# Patient Record
Sex: Female | Born: 1945
Health system: Southern US, Community
[De-identification: ages and names within clinical notes are randomized; demographics above are authoritative.]

## PROBLEM LIST (undated history)

## (undated) DIAGNOSIS — R3915 Urgency of urination: Secondary | ICD-10-CM

## (undated) DIAGNOSIS — R1310 Dysphagia, unspecified: Secondary | ICD-10-CM

## (undated) DIAGNOSIS — M797 Fibromyalgia: Secondary | ICD-10-CM

## (undated) DIAGNOSIS — N189 Chronic kidney disease, unspecified: Secondary | ICD-10-CM

## (undated) DIAGNOSIS — R51 Headache: Secondary | ICD-10-CM

## (undated) DIAGNOSIS — R102 Pelvic and perineal pain unspecified side: Secondary | ICD-10-CM

## (undated) DIAGNOSIS — S060X9A Concussion with loss of consciousness of unspecified duration, initial encounter: Secondary | ICD-10-CM

## (undated) DIAGNOSIS — D649 Anemia, unspecified: Secondary | ICD-10-CM

## (undated) DIAGNOSIS — Z85828 Personal history of other malignant neoplasm of skin: Secondary | ICD-10-CM

## (undated) DIAGNOSIS — F329 Major depressive disorder, single episode, unspecified: Secondary | ICD-10-CM

## (undated) DIAGNOSIS — I4892 Unspecified atrial flutter: Secondary | ICD-10-CM

## (undated) DIAGNOSIS — Z86010 Personal history of colonic polyps: Secondary | ICD-10-CM

## (undated) DIAGNOSIS — G629 Polyneuropathy, unspecified: Secondary | ICD-10-CM

## (undated) DIAGNOSIS — T7840XA Allergy, unspecified, initial encounter: Secondary | ICD-10-CM

## (undated) DIAGNOSIS — S060XAA Concussion with loss of consciousness status unknown, initial encounter: Secondary | ICD-10-CM

## (undated) DIAGNOSIS — Z8489 Family history of other specified conditions: Secondary | ICD-10-CM

## (undated) DIAGNOSIS — I499 Cardiac arrhythmia, unspecified: Secondary | ICD-10-CM

## (undated) DIAGNOSIS — I10 Essential (primary) hypertension: Secondary | ICD-10-CM

## (undated) DIAGNOSIS — J449 Chronic obstructive pulmonary disease, unspecified: Secondary | ICD-10-CM

## (undated) DIAGNOSIS — J189 Pneumonia, unspecified organism: Secondary | ICD-10-CM

## (undated) DIAGNOSIS — M3500 Sicca syndrome, unspecified: Secondary | ICD-10-CM

## (undated) DIAGNOSIS — M1990 Unspecified osteoarthritis, unspecified site: Secondary | ICD-10-CM

## (undated) DIAGNOSIS — R350 Frequency of micturition: Secondary | ICD-10-CM

## (undated) DIAGNOSIS — H269 Unspecified cataract: Secondary | ICD-10-CM

## (undated) DIAGNOSIS — C50919 Malignant neoplasm of unspecified site of unspecified female breast: Secondary | ICD-10-CM

## (undated) DIAGNOSIS — E785 Hyperlipidemia, unspecified: Secondary | ICD-10-CM

## (undated) DIAGNOSIS — Z8709 Personal history of other diseases of the respiratory system: Secondary | ICD-10-CM

## (undated) DIAGNOSIS — K5909 Other constipation: Secondary | ICD-10-CM

## (undated) DIAGNOSIS — R519 Headache, unspecified: Secondary | ICD-10-CM

## (undated) DIAGNOSIS — Z923 Personal history of irradiation: Secondary | ICD-10-CM

## (undated) DIAGNOSIS — Z8719 Personal history of other diseases of the digestive system: Secondary | ICD-10-CM

## (undated) DIAGNOSIS — F419 Anxiety disorder, unspecified: Secondary | ICD-10-CM

## (undated) DIAGNOSIS — I2510 Atherosclerotic heart disease of native coronary artery without angina pectoris: Secondary | ICD-10-CM

## (undated) DIAGNOSIS — R0600 Dyspnea, unspecified: Secondary | ICD-10-CM

## (undated) DIAGNOSIS — K219 Gastro-esophageal reflux disease without esophagitis: Secondary | ICD-10-CM

## (undated) DIAGNOSIS — C801 Malignant (primary) neoplasm, unspecified: Secondary | ICD-10-CM

## (undated) DIAGNOSIS — G709 Myoneural disorder, unspecified: Secondary | ICD-10-CM

## (undated) DIAGNOSIS — Z87898 Personal history of other specified conditions: Secondary | ICD-10-CM

## (undated) DIAGNOSIS — N183 Chronic kidney disease, stage 3 unspecified: Secondary | ICD-10-CM

## (undated) DIAGNOSIS — M810 Age-related osteoporosis without current pathological fracture: Secondary | ICD-10-CM

## (undated) DIAGNOSIS — Z9889 Other specified postprocedural states: Secondary | ICD-10-CM

## (undated) DIAGNOSIS — Z853 Personal history of malignant neoplasm of breast: Secondary | ICD-10-CM

## (undated) DIAGNOSIS — F32A Depression, unspecified: Secondary | ICD-10-CM

## (undated) DIAGNOSIS — A419 Sepsis, unspecified organism: Secondary | ICD-10-CM

## (undated) HISTORY — DX: Unspecified atrial flutter: I48.92

## (undated) HISTORY — DX: Atherosclerotic heart disease of native coronary artery without angina pectoris: I25.10

## (undated) HISTORY — DX: Sepsis, unspecified organism: A41.9

## (undated) HISTORY — DX: Depression, unspecified: F32.A

## (undated) HISTORY — PX: SKIN CANCER EXCISION: SHX779

## (undated) HISTORY — DX: Unspecified osteoarthritis, unspecified site: M19.90

## (undated) HISTORY — DX: Allergy, unspecified, initial encounter: T78.40XA

## (undated) HISTORY — DX: Major depressive disorder, single episode, unspecified: F32.9

## (undated) HISTORY — DX: Family history of other specified conditions: Z84.89

## (undated) HISTORY — DX: Malignant (primary) neoplasm, unspecified: C80.1

## (undated) HISTORY — DX: Gastro-esophageal reflux disease without esophagitis: K21.9

## (undated) HISTORY — PX: UPPER GASTROINTESTINAL ENDOSCOPY: SHX188

## (undated) HISTORY — DX: Chronic kidney disease, stage 3 unspecified: N18.30

## (undated) HISTORY — DX: Polyneuropathy, unspecified: G62.9

## (undated) HISTORY — DX: Essential (primary) hypertension: I10

## (undated) HISTORY — DX: Dysphagia, unspecified: R13.10

## (undated) HISTORY — DX: Concussion with loss of consciousness status unknown, initial encounter: S06.0XAA

## (undated) HISTORY — DX: Unspecified cataract: H26.9

## (undated) HISTORY — PX: CARDIOVASCULAR STRESS TEST: SHX262

## (undated) HISTORY — DX: Hyperlipidemia, unspecified: E78.5

## (undated) HISTORY — PX: CARDIAC CATHETERIZATION: SHX172

## (undated) HISTORY — DX: Anemia, unspecified: D64.9

## (undated) HISTORY — DX: Concussion with loss of consciousness of unspecified duration, initial encounter: S06.0X9A

## (undated) HISTORY — DX: Age-related osteoporosis without current pathological fracture: M81.0

## (undated) HISTORY — DX: Chronic kidney disease, unspecified: N18.9

## (undated) HISTORY — DX: Anxiety disorder, unspecified: F41.9

## (undated) HISTORY — DX: Personal history of colonic polyps: Z86.010

## (undated) HISTORY — DX: Sjogren syndrome, unspecified: M35.00

## (undated) HISTORY — PX: POLYPECTOMY: SHX149

## (undated) HISTORY — DX: Myoneural disorder, unspecified: G70.9

## (undated) HISTORY — PX: CYSTOSCOPY: SUR368

## (undated) HISTORY — PX: TONSILLECTOMY AND ADENOIDECTOMY: SUR1326

## (undated) HISTORY — PX: OTHER SURGICAL HISTORY: SHX169

---

## 1980-12-25 HISTORY — PX: TOTAL ABDOMINAL HYSTERECTOMY W/ BILATERAL SALPINGOOPHORECTOMY: SHX83

## 1983-12-26 HISTORY — PX: NASAL SINUS SURGERY: SHX719

## 2004-12-25 HISTORY — PX: OTHER SURGICAL HISTORY: SHX169

## 2007-01-17 ENCOUNTER — Encounter: Admission: RE | Admit: 2007-01-17 | Discharge: 2007-01-17 | Payer: Self-pay | Admitting: Family Medicine

## 2007-09-10 ENCOUNTER — Ambulatory Visit: Payer: Self-pay | Admitting: Cardiology

## 2007-09-10 ENCOUNTER — Inpatient Hospital Stay (HOSPITAL_COMMUNITY): Admission: EM | Admit: 2007-09-10 | Discharge: 2007-09-13 | Payer: Self-pay | Admitting: Emergency Medicine

## 2007-09-11 ENCOUNTER — Ambulatory Visit: Payer: Self-pay | Admitting: Vascular Surgery

## 2007-09-11 ENCOUNTER — Encounter (INDEPENDENT_AMBULATORY_CARE_PROVIDER_SITE_OTHER): Payer: Self-pay | Admitting: Internal Medicine

## 2007-09-25 ENCOUNTER — Encounter: Admission: RE | Admit: 2007-09-25 | Discharge: 2007-11-07 | Payer: Self-pay | Admitting: Family Medicine

## 2007-10-02 ENCOUNTER — Ambulatory Visit: Payer: Self-pay | Admitting: Cardiology

## 2008-02-05 ENCOUNTER — Other Ambulatory Visit: Admission: RE | Admit: 2008-02-05 | Discharge: 2008-02-05 | Payer: Self-pay | Admitting: Family Medicine

## 2009-05-11 ENCOUNTER — Ambulatory Visit (HOSPITAL_BASED_OUTPATIENT_CLINIC_OR_DEPARTMENT_OTHER): Admission: RE | Admit: 2009-05-11 | Discharge: 2009-05-11 | Payer: Self-pay | Admitting: Family Medicine

## 2009-05-11 ENCOUNTER — Ambulatory Visit: Payer: Self-pay | Admitting: Diagnostic Radiology

## 2009-12-17 ENCOUNTER — Ambulatory Visit: Payer: Self-pay | Admitting: Interventional Radiology

## 2009-12-17 ENCOUNTER — Emergency Department (HOSPITAL_BASED_OUTPATIENT_CLINIC_OR_DEPARTMENT_OTHER): Admission: EM | Admit: 2009-12-17 | Discharge: 2009-12-17 | Payer: Self-pay | Admitting: Emergency Medicine

## 2010-01-05 ENCOUNTER — Encounter (INDEPENDENT_AMBULATORY_CARE_PROVIDER_SITE_OTHER): Payer: Self-pay | Admitting: *Deleted

## 2010-01-11 ENCOUNTER — Ambulatory Visit: Payer: Self-pay | Admitting: Family Medicine

## 2010-01-11 DIAGNOSIS — N644 Mastodynia: Secondary | ICD-10-CM | POA: Insufficient documentation

## 2010-01-11 DIAGNOSIS — M797 Fibromyalgia: Secondary | ICD-10-CM | POA: Insufficient documentation

## 2010-01-11 DIAGNOSIS — Z85828 Personal history of other malignant neoplasm of skin: Secondary | ICD-10-CM | POA: Insufficient documentation

## 2010-01-11 DIAGNOSIS — IMO0001 Reserved for inherently not codable concepts without codable children: Secondary | ICD-10-CM

## 2010-01-14 ENCOUNTER — Encounter: Payer: Self-pay | Admitting: Family Medicine

## 2010-01-14 LAB — CONVERTED CEMR LAB
ANA Titer 1: NEGATIVE
Albumin ELP: 57.4 % (ref 55.8–66.1)
Angiotensin 1 Converting Enzyme: 39 units/L (ref 9–67)
Anti Nuclear Antibody(ANA): POSITIVE — AB
Anticardiolipin IgG: <3 (ref <10)
Anticardiolipin IgM: <3 (ref <10)
C3 Complement: 100 mg/dL (ref 88–201)
ENA SM Ab Ser-aCnc: <0.2 (ref <1.0)
Scleroderma (Scl-70) (ENA) Antibody, IgG: <0.2 (ref <1.0)
Total Protein, Serum Electrophoresis: 6.9 g/dL (ref 6.0–8.3)

## 2010-01-17 ENCOUNTER — Ambulatory Visit: Payer: Self-pay | Admitting: Family Medicine

## 2010-01-18 ENCOUNTER — Encounter: Payer: Self-pay | Admitting: Family Medicine

## 2010-03-10 ENCOUNTER — Ambulatory Visit: Payer: Self-pay | Admitting: Family Medicine

## 2010-03-10 ENCOUNTER — Telehealth (INDEPENDENT_AMBULATORY_CARE_PROVIDER_SITE_OTHER): Payer: Self-pay | Admitting: *Deleted

## 2010-03-10 DIAGNOSIS — J209 Acute bronchitis, unspecified: Secondary | ICD-10-CM | POA: Insufficient documentation

## 2010-03-10 DIAGNOSIS — J40 Bronchitis, not specified as acute or chronic: Secondary | ICD-10-CM | POA: Insufficient documentation

## 2010-03-10 DIAGNOSIS — J329 Chronic sinusitis, unspecified: Secondary | ICD-10-CM | POA: Insufficient documentation

## 2010-03-23 ENCOUNTER — Encounter: Payer: Self-pay | Admitting: Family Medicine

## 2010-06-09 ENCOUNTER — Telehealth (INDEPENDENT_AMBULATORY_CARE_PROVIDER_SITE_OTHER): Payer: Self-pay | Admitting: *Deleted

## 2010-08-03 ENCOUNTER — Ambulatory Visit: Payer: Self-pay | Admitting: Internal Medicine

## 2010-08-03 ENCOUNTER — Ambulatory Visit: Payer: Self-pay | Admitting: Family Medicine

## 2010-08-03 ENCOUNTER — Inpatient Hospital Stay (HOSPITAL_COMMUNITY): Admission: EM | Admit: 2010-08-03 | Discharge: 2010-08-04 | Payer: Self-pay | Admitting: Emergency Medicine

## 2010-08-03 DIAGNOSIS — R0609 Other forms of dyspnea: Secondary | ICD-10-CM | POA: Insufficient documentation

## 2010-08-03 DIAGNOSIS — R0989 Other specified symptoms and signs involving the circulatory and respiratory systems: Secondary | ICD-10-CM

## 2010-08-03 DIAGNOSIS — F411 Generalized anxiety disorder: Secondary | ICD-10-CM | POA: Insufficient documentation

## 2010-08-03 HISTORY — DX: Generalized anxiety disorder: F41.1

## 2010-08-05 ENCOUNTER — Telehealth: Payer: Self-pay | Admitting: Cardiovascular Disease

## 2010-08-10 ENCOUNTER — Telehealth: Payer: Self-pay | Admitting: Cardiovascular Disease

## 2010-08-25 DIAGNOSIS — Z87891 Personal history of nicotine dependence: Secondary | ICD-10-CM | POA: Insufficient documentation

## 2010-08-25 DIAGNOSIS — R0789 Other chest pain: Secondary | ICD-10-CM | POA: Insufficient documentation

## 2010-08-25 DIAGNOSIS — I2510 Atherosclerotic heart disease of native coronary artery without angina pectoris: Secondary | ICD-10-CM | POA: Insufficient documentation

## 2010-08-25 DIAGNOSIS — I10 Essential (primary) hypertension: Secondary | ICD-10-CM | POA: Insufficient documentation

## 2010-08-25 HISTORY — DX: Essential (primary) hypertension: I10

## 2010-08-25 HISTORY — DX: Atherosclerotic heart disease of native coronary artery without angina pectoris: I25.10

## 2010-08-31 ENCOUNTER — Ambulatory Visit: Payer: Self-pay | Admitting: Cardiovascular Disease

## 2010-09-05 ENCOUNTER — Ambulatory Visit: Payer: Self-pay | Admitting: Family Medicine

## 2010-09-05 DIAGNOSIS — K219 Gastro-esophageal reflux disease without esophagitis: Secondary | ICD-10-CM | POA: Insufficient documentation

## 2010-09-05 DIAGNOSIS — R5381 Other malaise: Secondary | ICD-10-CM | POA: Insufficient documentation

## 2010-09-05 DIAGNOSIS — R5383 Other fatigue: Secondary | ICD-10-CM

## 2010-09-06 ENCOUNTER — Encounter (INDEPENDENT_AMBULATORY_CARE_PROVIDER_SITE_OTHER): Payer: Self-pay | Admitting: *Deleted

## 2010-09-19 ENCOUNTER — Ambulatory Visit: Payer: Self-pay | Admitting: Gastroenterology

## 2010-09-20 ENCOUNTER — Ambulatory Visit: Payer: Self-pay | Admitting: Gastroenterology

## 2010-09-23 ENCOUNTER — Encounter: Payer: Self-pay | Admitting: Family Medicine

## 2010-09-29 ENCOUNTER — Ambulatory Visit: Payer: Self-pay | Admitting: Gastroenterology

## 2010-09-29 HISTORY — PX: COLONOSCOPY: SHX174

## 2010-09-30 ENCOUNTER — Telehealth (INDEPENDENT_AMBULATORY_CARE_PROVIDER_SITE_OTHER): Payer: Self-pay | Admitting: *Deleted

## 2010-10-03 ENCOUNTER — Encounter: Payer: Self-pay | Admitting: Family Medicine

## 2010-10-18 ENCOUNTER — Ambulatory Visit: Payer: Self-pay | Admitting: Family Medicine

## 2010-10-18 DIAGNOSIS — N6019 Diffuse cystic mastopathy of unspecified breast: Secondary | ICD-10-CM | POA: Insufficient documentation

## 2010-10-18 DIAGNOSIS — Z87898 Personal history of other specified conditions: Secondary | ICD-10-CM | POA: Insufficient documentation

## 2010-10-18 HISTORY — DX: Diffuse cystic mastopathy of unspecified breast: N60.19

## 2010-10-19 ENCOUNTER — Ambulatory Visit (HOSPITAL_COMMUNITY): Payer: Self-pay | Admitting: Psychiatry

## 2010-10-26 ENCOUNTER — Encounter: Payer: Self-pay | Admitting: Family Medicine

## 2010-11-02 ENCOUNTER — Ambulatory Visit: Payer: Self-pay | Admitting: Family Medicine

## 2010-11-02 ENCOUNTER — Ambulatory Visit: Payer: Self-pay | Admitting: Gastroenterology

## 2010-11-09 ENCOUNTER — Ambulatory Visit: Payer: Self-pay | Admitting: Cardiovascular Disease

## 2010-11-14 ENCOUNTER — Encounter: Payer: Self-pay | Admitting: Cardiovascular Disease

## 2010-11-14 ENCOUNTER — Ambulatory Visit: Payer: Self-pay | Admitting: Cardiovascular Disease

## 2010-11-14 ENCOUNTER — Ambulatory Visit: Payer: Self-pay

## 2010-11-14 ENCOUNTER — Ambulatory Visit (HOSPITAL_COMMUNITY): Admission: RE | Admit: 2010-11-14 | Discharge: 2010-11-14 | Payer: Self-pay | Admitting: Cardiovascular Disease

## 2010-11-21 ENCOUNTER — Ambulatory Visit (HOSPITAL_COMMUNITY)
Admission: RE | Admit: 2010-11-21 | Discharge: 2010-11-21 | Payer: Self-pay | Source: Home / Self Care | Admitting: Gastroenterology

## 2010-11-24 ENCOUNTER — Telehealth: Payer: Self-pay | Admitting: Family Medicine

## 2010-11-24 ENCOUNTER — Ambulatory Visit (HOSPITAL_BASED_OUTPATIENT_CLINIC_OR_DEPARTMENT_OTHER)
Admission: RE | Admit: 2010-11-24 | Discharge: 2010-11-24 | Payer: Self-pay | Source: Home / Self Care | Admitting: Family Medicine

## 2010-11-24 ENCOUNTER — Ambulatory Visit: Payer: Self-pay | Admitting: Family Medicine

## 2010-12-01 ENCOUNTER — Ambulatory Visit: Payer: Self-pay | Admitting: Gastroenterology

## 2010-12-02 ENCOUNTER — Ambulatory Visit: Payer: Self-pay | Admitting: Family Medicine

## 2010-12-29 ENCOUNTER — Encounter: Payer: Self-pay | Admitting: Gastroenterology

## 2010-12-29 ENCOUNTER — Ambulatory Visit
Admission: RE | Admit: 2010-12-29 | Discharge: 2010-12-29 | Payer: Self-pay | Source: Home / Self Care | Attending: Gastroenterology | Admitting: Gastroenterology

## 2010-12-29 ENCOUNTER — Encounter (INDEPENDENT_AMBULATORY_CARE_PROVIDER_SITE_OTHER): Payer: Self-pay | Admitting: *Deleted

## 2010-12-29 DIAGNOSIS — R1310 Dysphagia, unspecified: Secondary | ICD-10-CM | POA: Insufficient documentation

## 2010-12-30 ENCOUNTER — Ambulatory Visit (HOSPITAL_COMMUNITY)
Admission: RE | Admit: 2010-12-30 | Discharge: 2010-12-30 | Payer: Self-pay | Source: Home / Self Care | Attending: Gastroenterology | Admitting: Gastroenterology

## 2010-12-30 ENCOUNTER — Encounter: Payer: Self-pay | Admitting: Gastroenterology

## 2011-01-02 ENCOUNTER — Encounter: Payer: Self-pay | Admitting: Gastroenterology

## 2011-01-16 LAB — HM MAMMOGRAPHY: HM Mammogram: NORMAL

## 2011-01-17 ENCOUNTER — Telehealth: Payer: Self-pay | Admitting: Family Medicine

## 2011-01-19 ENCOUNTER — Encounter: Payer: Self-pay | Admitting: Family Medicine

## 2011-01-24 NOTE — Letter (Signed)
Summary: Results Letter  [facility]  [address] [place], [place] [address]   Phone: [phone]  Fax: [phone]        September 19, 2010 MRN: [id number]    [name] [address] [name], [place]  [address]    Dear [name],  It is my pleasure to have treated you recently as a new patient in my office. I appreciate your confidence and the opportunity to participate in your care.  Since I do have a busy inpatient endoscopy schedule and office schedule, my office hours vary weekly. I am, however, available for emergency calls everyday through my office. If I am not available for an urgent office appointment, another one of our gastroenterologist will be able to assist you.  My well-trained staff are prepared to help you at all times. For emergencies after office hours, a physician from our Gastroenterology section is always available through my 24 hour answering service  Once again I welcome you as a new patient and I look forward to a happy and healthy relationship             Sincerely,  Louis Meckel MD  This letter has been electronically signed by your physician.  Appended Document: Results Letter letter mailed
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
## 2011-01-24 NOTE — Assessment & Plan Note (Signed)
Summary: NEW PT/[employer]/NS/KDC   Vital Signs:  Patient profile:   65 year old female Height:      65 inches Weight:      105.25 pounds BMI:     17.58 Temp:     98.1 degrees F oral Pulse rate:   76 / minute Pulse rhythm:   regular BP sitting:   130 / 80  (left arm) Cuff size:   regular  Vitals Entered By: Army Fossa CMA (January 11, 2010 2:35 PM) CC: Pt here to establish- she fell the 12/17/09 and was seen in the ER.    History of Present Illness: Pt here to  establish.  Pt fell 12/17/2009 and had hand surgery after jan 1 with Dr Mina Marble.  When patient  fell she hit her head.    ER did xrays ---no CT.   Pt told by ER and previous pcp she needed an MRI secondary to head injury and recurrent sinusitis.    Pt had lost weight down to 96 lbs but has been able to eat more and get that up. Pt was in hospital at [facility] for diarrhea 02/2008.Marland Kitchen  She saw GI--- colonoscopy gone---pt dx with IBS---- now she has constipation.    Her mother just dxwith rectal cancer.    Pt also c/o R breast pain---no mammo since 2009.   Pt states it has been tender only---no lumps.  Not related to fall.  This has been since oct.    Pt sees Dr Corliss Skains for fibromyalgia.  Pt has rx for labs from her.  -- pt sees her 02/11/2010.   Dr Corliss Skains also sent her to [facility] but she cancelled appoint ---she will reschedule.  -- she was being sent there for memory loss and neck pain.      Preventive Screening-Counseling & Management  Alcohol-Tobacco     Alcohol drinks/day: <1     Smoking Status: current     Smoking Cessation Counseling: yes     Packs/Day: <0.25     Year Started: 1967     Year Quit: quit several times--started 2 years ago  Caffeine-Diet-Exercise     Caffeine use/day: 0     Does Patient Exercise: no      Sexual History:  currently monogamous and married.        Drug Use:  no.    Current Medications (verified): 1)  Hydrochlorothiazide 25 Mg Tabs (Hydrochlorothiazide) .Marland Kitchen.. 1 By Mouth  Daily. 2)  Atenolol 50 Mg Tabs (Atenolol) .... 1/2 Tab Two Times A Day 3)  Premarin 0.9 Mg Tabs (Estrogens Conjugated) .Marland Kitchen.. 1 Tab Once Daily 4)  Cymbalta 60 Mg Cpep (Duloxetine Hcl) .Marland Kitchen.. 1 Tab Once Daily 5)  Trazodone Hcl 100 Mg Tabs (Trazodone Hcl) .Marland Kitchen.. 1 Tab Hs. 6)  Tramadol Hcl 50 Mg Tabs (Tramadol Hcl) .Marland Kitchen.. 1 or 2 Tabs Two Times A Day 7)  Methocarbamol 500 Mg Tabs (Methocarbamol) .Marland Kitchen.. 1 or 2 Once Daily 8)  Dexilant 60 Mg Cpdr (Dexlansoprazole) .Marland Kitchen.. 1 By Mouth Once Daily 9)  Nasonex 50 Mcg/act Susp (Mometasone Furoate) .Marland Kitchen.. 1-2 Sprays 10)  Clarinex 5 Mg Tabs (Desloratadine) .Marland Kitchen.. 1 By Mouth Once Daily 11)  Hydrocodone-Acetaminophen 5-500 Mg Tabs (Hydrocodone-Acetaminophen) .Marland Kitchen.. 1-2 Tabs Q 4-6 Hrs 12)  Proair Hfa 108 (90 Base) Mcg/act Aers (Albuterol Sulfate) 13)  Hydrocortisone Ace-Pramoxine 2.5-1 % Crea (Hydrocortisone Ace-Pramoxine) .... Use As Directed.  Allergies (verified): 1)  ! Pcn 2)  ! Talwin 3)  ! Codeine 4)  ! * Myocins  Past  History:  Family History: Last updated: 01/11/2010 Colon cancer Heart disease Family History of Arthritis Family History Breast cancer 1st degree relative <50 Family History Diabetes 1st degree relative Family History High cholesterol Family History Hypertension Family History Lung cancer Family History of Sudden Death Family History Osteoporosis  Social History: Last updated: 01/11/2010 Retired Married Current Smoker Alcohol use-yes Drug use-no Regular exercise-no  Risk Factors: Alcohol Use: <1 (01/11/2010) Caffeine Use: 0 (01/11/2010) Exercise: no (01/11/2010)  Risk Factors: Smoking Status: current (01/11/2010) Packs/Day: <0.25 (01/11/2010)  Past Medical History: Arthritis Asthma Skin cancer, hx of Chronic Bronchitis Frequent Headaches GERD Heart Arrythmia High BP readings  Migraines  Polyps in colon Thyroid Problems  Past Surgical History: Breast Biopsy Appendectomy Hysterectomy Tonsillectomy  Family  History: Reviewed history and no changes required. Colon cancer Heart disease Family History of Arthritis Family History Breast cancer 1st degree relative <50 Family History Diabetes 1st degree relative Family History High cholesterol Family History Hypertension Family History Lung cancer Family History of Sudden Death Family History Osteoporosis  Social History: Reviewed history and no changes required. Retired Married Current Smoker Alcohol use-yes Drug use-no Regular exercise-no Smoking Status:  current Drug Use:  no Does Patient Exercise:  no Packs/Day:  <0.25 Caffeine use/day:  0 Sexual History:  currently monogamous, married  Review of Systems      See HPI  Physical Exam  General:  Well-developed,well-nourished,in no acute distress; alert,appropriate and cooperative throughout examination Ears:  External ear exam shows no significant lesions or deformities.  Otoscopic examination reveals clear canals, tympanic membranes are intact bilaterally without bulging, retraction, inflammation or discharge. Hearing is grossly normal bilaterally. Nose:  External nasal examination shows no deformity or inflammation. Nasal mucosa are pink and moist without lesions or exudates. Mouth:  Oral mucosa and oropharynx without lesions or exudates.  Teeth in good repair. Neck:  No deformities, masses, or tenderness noted. Breasts:  skin/areolae normal, no masses, no nipple discharge, no adenopathy, R breast tender, and R breast thickening.  --- Lateral R breast Lungs:  Normal respiratory effort, chest expands symmetrically. Lungs are clear to auscultation, no crackles or wheezes. Heart:  normal rate and no murmur.   Neurologic:  alert & oriented X3, strength normal in all extremities, gait normal, and DTRs symmetrical and normal.   Skin:  Intact without suspicious lesions or rashes Cervical Nodes:  No lymphadenopathy noted Psych:  Cognition and judgment appear intact. Alert and  cooperative with normal attention span and concentration. No apparent delusions, illusions, hallucinations   Impression & Recommendations:  Problem # 1:  BREAST PAIN, RIGHT (ICD-611.71)  Orders: Radiology Referral (Radiology) EKG w/ Interpretation (93000)  Problem # 2:  FIBROMYALGIA (ICD-729.1)  The following medications were removed from the medication list:    Propoxyphene N-apap 100-650 Mg Tabs (Propoxyphene n-apap) .Marland Kitchen... Take 1 by mouth every 6 hrs Her updated medication list for this problem includes:    Tramadol Hcl 50 Mg Tabs (Tramadol hcl) .Marland Kitchen... 1 or 2 tabs two times a day    Methocarbamol 500 Mg Tabs (Methocarbamol) .Marland Kitchen... 1 or 2 once daily    Hydrocodone-acetaminophen 5-500 Mg Tabs (Hydrocodone-acetaminophen) .Marland Kitchen... 1-2 tabs q 4-6 hrs  Complete Medication List: 1)  Hydrochlorothiazide 25 Mg Tabs (Hydrochlorothiazide) .Marland Kitchen.. 1 by mouth daily. 2)  Atenolol 50 Mg Tabs (Atenolol) .... 1/2 tab two times a day 3)  Premarin 0.9 Mg Tabs (Estrogens conjugated) .Marland Kitchen.. 1 tab once daily 4)  Cymbalta 60 Mg Cpep (Duloxetine hcl) .Marland Kitchen.. 1 tab once daily 5)  Trazodone  Hcl 100 Mg Tabs (Trazodone hcl) .Marland Kitchen.. 1 tab hs. 6)  Tramadol Hcl 50 Mg Tabs (Tramadol hcl) .Marland Kitchen.. 1 or 2 tabs two times a day 7)  Methocarbamol 500 Mg Tabs (Methocarbamol) .Marland Kitchen.. 1 or 2 once daily 8)  Dexilant 60 Mg Cpdr (Dexlansoprazole) .Marland Kitchen.. 1 by mouth once daily 9)  Nasonex 50 Mcg/act Susp (Mometasone furoate) .Marland Kitchen.. 1-2 sprays 10)  Clarinex 5 Mg Tabs (Desloratadine) .Marland Kitchen.. 1 by mouth once daily 11)  Hydrocodone-acetaminophen 5-500 Mg Tabs (Hydrocodone-acetaminophen) .Marland Kitchen.. 1-2 tabs q 4-6 hrs 12)  Proair Hfa 108 (90 Base) Mcg/act Aers (Albuterol sulfate) 13)  Hydrocortisone Ace-pramoxine 2.5-1 % Crea (Hydrocortisone ace-pramoxine) .... Use as directed.  Patient Instructions: 1)  rto CPE 2)  need records from previous pcp Prescriptions: CYMBALTA 60 MG CPEP (DULOXETINE HCL) 1 tab once daily  #30 x 5   Entered and Authorized by:   Loreen Freud DO   Signed by:   Loreen Freud DO on 01/11/2010   Method used:   [facility]        [facility] [phone]* (retail)       [address]       [place], [place]  [address]       Ph: [phone]       Fax: [phone]   RxID:   [id number] PREMARIN 0.9 MG TABS (ESTROGENS CONJUGATED) 1 tab once daily  #30 x 5   Entered and Authorized by:   Loreen Freud DO   Signed by:   Loreen Freud DO on 01/11/2010   Method used:   [facility] to        [facility] [phone]* (retail)       [address]       [place], [place]  [address]       Ph: [phone]       Fax: [phone]   RxID:   [id number] ATENOLOL 50 MG TABS (ATENOLOL) 1/2 tab two times a day  #30 x 5   Entered and Authorized by:   Loreen Freud DO   Signed by:   Loreen Freud DO on 01/11/2010   Method used:   [facility]ically to        [facility] [phone]* (retail)       [address]       [place], [place]  [address]       Ph: [phone]       Fax: [phone]   RxID:   [id number] HYDROCHLOROTHIAZIDE 25 MG TABS (HYDROCHLOROTHIAZIDE) 1 by mouth daily.  #30 x 5   Entered and Authorized by:   Loreen Freud DO   Signed by:   Loreen Freud DO on 01/11/2010   Method used:   Electronically to        [facility] [phone]* (retail)       [address]       [place], [place]  [address]       Ph: [phone]       Fax: [phone]   RxID:   [id number]    EKG  Procedure date:  01/11/2010  Findings:      Normal sinus rhythm with rate of:  61 bpm

## 2011-01-24 NOTE — Progress Notes (Signed)
Summary: prior auth APPROVED DEXILANT [facility]  Phone Note Refill Request   Refills Requested: Medication #1:  DEXILANT 60 MG CPDR 1 by mouth once daily [facility] [phone] [facility] FAX [phone]   Method Requested: Fax to Local Pharmacy    Next Appointment Scheduled: no appt Initial call taken by:  Barb Merino,  March 10, 2010 12:56 PM  Follow-up for Phone Call        PT MUST HAVE TRIED AND FAILED OMEPRAZOLE, PROTONIX, or NEXIUM. [facility] office with pt to see if she has tried any of thes meds..................Marland KitchenFelecia Deloach CMA  March 10, 2010 1:32 PM  awaiting fax from [facility]............Marland KitchenFelecia Deloach CMA  March 10, 2010 1:35 PM    Additional Follow-up for Phone Call Additional follow up Details #1::        I spoke with pt and she states that she has tried all three medications. Army Fossa CMA  March 10, 2010 1:40 PM     Additional Follow-up for Phone Call Additional follow up Details #2::    prior auth faxed back awaiting response...............Marland KitchenFelecia Deloach CMA  March 10, 2010 2:23 PM    pt aware prior auth approved 02-17-10 until 03-09-12, pharmacy faxed approval letter scan to chart............Marland KitchenFelecia Deloach CMA  March 11, 2010 9:27 AM

## 2011-01-24 NOTE — Assessment & Plan Note (Signed)
Summary: mc [id number]/cbs   Vital Signs:  Patient profile:   65 year old female Weight:      108.4 pounds Temp:     98.0 degrees F Pulse rate:   64 / minute Pulse rhythm:   regular BP sitting:   122 / 76  (right arm)  Vitals Entered By: Almeta Monas CMA Duncan Dull) (September 05, 2010 3:04 PM) CC: F/U from the hospital   History of Present Illness: Pt here f/u from hospital.  Pt is doing much better.  She was upset when she left cardio a few days ago because she was confused and thought she "was going crazy".  She mis heard that something was wrong with her heart during her cath---  when told her heart chamber s were normal she didn't understand why she swore she heard different in procedure and then she was put on crestor.  She understands now she was sedated and didn't understand that the crestor was because she did have some blockages ( we went over the cath again in the office) and that her heart was functioning normally.  Pt does have an echo scheduled.  Pt feels much better- she has met with K Troxler and feels she understands now what happened and is no longer upset.    Current Medications (verified): 1)  Hydrochlorothiazide 25 Mg Tabs (Hydrochlorothiazide) .Marland Kitchen.. 1 By Mouth Daily. 2)  Atenolol 100 Mg Tabs (Atenolol) .Marland Kitchen.. 1 By Mouth Two Times A Day 3)  Premarin 0.9 Mg Tabs (Estrogens Conjugated) .Marland Kitchen.. 1 Tab Once Daily 4)  Trazodone Hcl 100 Mg Tabs (Trazodone Hcl) .Marland Kitchen.. 1 Tab Hs. 5)  Tramadol Hcl 50 Mg Tabs (Tramadol Hcl) .Marland Kitchen.. 1 or 2 Tabs Two Times A Day 6)  Methocarbamol 500 Mg Tabs (Methocarbamol) .Marland Kitchen.. 1 or 2 Once Daily 7)  Omeprazole 20 Mg Cpdr (Omeprazole) .Marland Kitchen.. 1 By Mouth Once Daily 8)  Nasonex 50 Mcg/act Susp (Mometasone Furoate) .Marland Kitchen.. 1-2 Sprays 9)  Clarinex 5 Mg Tabs (Desloratadine) .Marland Kitchen.. 1 By Mouth Once Daily 10)  Proair Hfa 108 (90 Base) Mcg/act Aers (Albuterol Sulfate) 11)  Hydrocortisone Ace-Pramoxine 2.5-1 % Crea (Hydrocortisone Ace-Pramoxine) .... Use As Directed. 12)  Neurontin  100 Mg Caps (Gabapentin) .... Three Times A Day 13)  Cymbalta 30 Mg Cpep (Duloxetine Hcl) .... 3 By Mouth Once Daily 14)  Valium 2 Mg Tabs (Diazepam) .... One Tablet As Needed 15)  Nitrostat 0.4 Mg Subl (Nitroglycerin) .Marland Kitchen.. 1 Tablet Under Tongue At Onset of Chest Pain; You May Repeat Every 5 Minutes For Up To 3 Doses. 16)  Aspirin 81 Mg Tbec (Aspirin) .... By Mouth Once Daily 17)  Crestor 10 Mg Tabs (Rosuvastatin Calcium) .Marland Kitchen.. 1 By Mouth At Bedtime  Allergies (verified): 1)  ! Pcn 2)  ! Talwin 3)  ! Codeine 4)  ! * Myocins  Past History:  Past Medical History: Last updated: 08/31/2010 Arthritis Asthma Skin cancer, hx of Chronic Bronchitis Frequent Headaches GERD Heart Arrythmia-Irregular heartbeat HTN Migraines  Polyps in colon Hypothyroidism  Past Surgical History: Last updated: 08/31/2010 Breast Biopsy Appendectomy Hysterectomy Tonsillectomy Skin cancer removal Vocal cord cyst removal  Family History: Last updated: 08/31/2010 Colon cancer Heart disease Family History of Arthritis Family History Breast cancer 1st degree relative <50 Family History Diabetes 1st degree relative Family History High cholesterol Family History Hypertension Family History Lung cancer Family History of Sudden Death Family History Osteoporosis  Father-deceased, CHF/CAD age 82 Mother-alive, healthy 1 sister-alive and healthy  Social History: Last updated: 08/31/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 Colon cancer Heart disease Family History of Arthritis Family History Breast cancer 1st degree relative  <50 Family History Diabetes 1st degree relative Family History High cholesterol Family History Hypertension Family History Lung cancer Family History of Sudden Death Family History Osteoporosis  Father-deceased, CHF/CAD age 77 Mother-alive, healthy 1 sister-alive and healthy  Social History: Reviewed history from 08/31/2010 and no changes required. 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 Lungs are clear to auscultation, no crackles or wheezes. Heart:  Normal rate and regular rhythm. S1 and S2 normal without gallop, murmur, click, rub or other extra sounds. Psych:  Oriented X3, memory intact for recent and remote, normally interactive, not anxious appearing, and not depressed appearing.     Impression & Recommendations:  Problem # 1:  CAD, NATIVE VESSEL (ICD-414.01)  Her updated medication list for this problem includes:    Hydrochlorothiazide 25 Mg Tabs (Hydrochlorothiazide) .Marland Kitchen... 1 by mouth daily.    Nitrostat 0.4 Mg Subl (Nitroglycerin) .Marland Kitchen... 1 tablet under tongue at onset of chest pain; you may repeat every 5 minutes for up to 3 doses.    Aspirin 81 Mg Tbec (Aspirin) ..... By mouth once daily  Problem # 2:  HYPERTENSION (ICD-401.9)  Her updated medication list for this problem includes:    Hydrochlorothiazide 25 Mg Tabs (Hydrochlorothiazide) .Marland Kitchen... 1 by mouth daily.  BP today: 122/76 Prior BP: 124/74 (08/31/2010)  Problem # 3:  GENERALIZED ANXIETY DISORDER (ICD-300.02)  dose of cymbalta adjusted- Her updated medication list for this problem includes:    Trazodone Hcl 100 Mg Tabs (Trazodone hcl) .Marland Kitchen... 1 tab hs.    Cymbalta 30 Mg Cpep  (Duloxetine hcl) .Marland KitchenMarland KitchenMarland KitchenMarland Kitchen 3 by mouth once daily    Valium 2 Mg Tabs (Diazepam) ..... One tablet as needed  Discussed medication use and relaxation techniques.   Problem # 4:  FIBROMYALGIA (ICD-729.1)  Her updated medication list for this problem includes:    Tramadol Hcl 50 Mg Tabs (Tramadol hcl) .Marland Kitchen... 1 or 2 tabs two times a day    Methocarbamol 500 Mg Tabs (Methocarbamol) .Marland Kitchen... 1 or 2 once daily    Aspirin 81 Mg Tbec (Aspirin) ..... By mouth once daily  Problem # 5:  GERD (ICD-530.81)  Her updated medication list for this problem includes:    Omeprazole 20 Mg Cpdr (Omeprazole) .Marland Kitchen... 1 by mouth once daily  Diagnostics Reviewed:  Discussed lifestyle modifications, diet, antacids/medications, and preventive measures. Handout provided.   Complete Medication List: 1)  Hydrochlorothiazide 25 Mg Tabs (Hydrochlorothiazide) .Marland Kitchen.. 1 by mouth daily. 2)  Atenolol 100 Mg Tabs (atenolol)  .Marland Kitchen.. 1 by mouth two times a day 3)  Premarin 0.9 Mg Tabs (Estrogens conjugated) .Marland Kitchen.. 1 tab once daily 4)  Trazodone Hcl 100 Mg Tabs (Trazodone hcl) .Marland Kitchen.. 1 tab hs. 5)  Tramadol Hcl 50 Mg Tabs (Tramadol hcl) .Marland Kitchen.. 1 or 2 tabs two times a day 6)  Methocarbamol 500 Mg Tabs (Methocarbamol) .Marland Kitchen.. 1 or 2 once daily  7)  Omeprazole 20 Mg Cpdr (Omeprazole) .Marland Kitchen.. 1 by mouth once daily 8)  Nasonex 50 Mcg/act Susp (Mometasone furoate) .Marland Kitchen.. 1-2 sprays 9)  Clarinex 5 Mg Tabs (Desloratadine) .Marland Kitchen.. 1 by mouth once daily 10)  Proair Hfa 108 (90 Base) Mcg/act Aers (Albuterol sulfate) 11)  Hydrocortisone Ace-pramoxine 2.5-1 % Crea (Hydrocortisone ace-pramoxine) .... Use as directed. 12)  Neurontin 100 Mg Caps (Gabapentin) .... Three times a day 13)  Cymbalta 30 Mg Cpep (Duloxetine hcl) .... 3 by mouth once daily 14)  Valium 2 Mg Tabs (Diazepam) .... One tablet as needed 15)  Nitrostat 0.4 Mg Subl (Nitroglycerin) .Marland Kitchen.. 1 tablet under tongue at onset of chest pain; you may repeat every 5 minutes for up to 3 doses. 16)  Aspirin 81 Mg Tbec  (Aspirin) .... By mouth once daily 17)  Crestor 10 Mg Tabs (Rosuvastatin calcium) .Marland Kitchen.. 1 by mouth at bedtime  Other Orders: Venipuncture (52841) TLB-B12 + Folate Pnl (32440_10272-Z36/UYQ) TLB-BMP (Basic Metabolic Panel-BMET) (80048-METABOL) TLB-CBC Platelet - w/Differential (85025-CBCD) TLB-Hepatic/Liver Function Pnl (80076-HEPATIC) TLB-TSH (Thyroid Stimulating Hormone) (03474-QVZ) Gastroenterology Referral (GI) Prescriptions: OMEPRAZOLE 20 MG CPDR (OMEPRAZOLE) 1 by mouth once daily  #90 x 3   Entered and Authorized by:   Loreen Freud DO   Signed by:   Loreen Freud DO on 09/05/2010   Method used:   [facility] to        [facility] [phone][facility] (retail)       [address]       [place], [place]  [address]       Ph: [phone] or [phone]       Fax: [phone]   RxID:   [id number]

## 2011-01-24 NOTE — Progress Notes (Signed)
Summary: alternative med  Phone Note From Pharmacy   [facility]  [address] [phone][facility] Summary of Call: Call from pharmacy med BIAXIN XL 500 MG XR24H-TAB (CLARITHROMYCIN) is discontinue. pls advise on alternative.........Marland KitchenFelecia Deloach CMA  November 24, 2010 4:52 PM   Follow-up for Phone Call        zpack #1  as directed  Follow-up by: Loreen Freud DO,  November 24, 2010 5:01 PM  Additional Follow-up for Phone Call Additional follow up Details #1::        pt aware.... Almeta Monas CMA (AAMA)  November 24, 2010 5:03 PM     New/Updated Medications: ZITHROMAX Z-PAK 250 MG TABS (AZITHROMYCIN) by mouth as directed Prescriptions: ZITHROMAX Z-PAK 250 MG TABS (AZITHROMYCIN) by mouth as directed  #1 x 0   Entered by:   Almeta Monas CMA (AAMA)   Authorized by:   Loreen Freud DO   Signed by:   Almeta Monas CMA (AAMA) on 11/24/2010   Method used:   [facility] to        [facility] [phone][facility] (retail)       [address]       [place], [place]  [address]       Ph: [phone] or [phone]       Fax: [phone]   RxID:   [id number]

## 2011-01-24 NOTE — Assessment & Plan Note (Signed)
Summary: sinus, checy congestion for 3 weeks///sph   Vital Signs:  Patient profile:   65 year old female Weight:      112.8 pounds O2 Sat:      98 % on Room air Temp:     98.2 degrees F oral Pulse rate:   71 / minute Pulse rhythm:   irregular BP sitting:   118 / 72  (right arm) Cuff size:   small  Vitals Entered By: Almeta Monas CMA Duncan Dull) (November 02, 2010 11:57 AM)  O2 Flow:  Room air CC: c/o sinus pressure, cough, congestion, green sputum, URI symptoms   History of Present Illness:       This is a 65 year old woman who presents with URI symptoms.  The symptoms began 3 weeks ago.  The patient complains of nasal congestion, purulent nasal discharge, dry cough, and sick contacts, but denies clear nasal discharge, sore throat, productive cough, and earache.  The patient denies fever, low-grade fever (<100.5 degrees), fever of 100.5-103 degrees, fever of 103.1-104 degrees, fever to >104 degrees, stiff neck, dyspnea, wheezing, rash, vomiting, diarrhea, use of an antipyretic, and response to antipyretic.  The patient also reports headache.  The patient denies itchy watery eyes, itchy throat, sneezing, seasonal symptoms, response to antihistamine, muscle aches, and severe fatigue.  The patient denies the following risk factors for Strep sinusitis: unilateral facial pain, unilateral nasal discharge, poor response to decongestant, double sickening, tooth pain, Strep exposure, tender adenopathy, and absence of cough.    Current Medications (verified): 1)  Hydrochlorothiazide 25 Mg Tabs (Hydrochlorothiazide) .Marland Kitchen.. 1 By Mouth Daily. 2)  Atenolol 50 Mg Tabs (Atenolol) .... 1/2 Tablet By Mouth Two Times A Day 3)  Premarin 0.9 Mg Tabs (Estrogens Conjugated) .Marland Kitchen.. 1 Tab Once Daily 4)  Trazodone Hcl 100 Mg Tabs (Trazodone Hcl) .Marland Kitchen.. 1 Tab Hs. 5)  Tramadol Hcl 50 Mg Tabs (Tramadol Hcl) .Marland Kitchen.. 1 or 2 Tabs Two Times A Day 6)  Nasonex 50 Mcg/act Susp (Mometasone Furoate) .Marland Kitchen.. 1-2 Sprays 7)  Clarinex 5 Mg  Tabs (Desloratadine) .Marland Kitchen.. 1 By Mouth Once Daily 8)  Proair Hfa 108 (90 Base) Mcg/act Aers (Albuterol Sulfate) 9)  Hydrocortisone Ace-Pramoxine 2.5-1 % Crea (Hydrocortisone Ace-Pramoxine) .... Use As Directed. 10)  Neurontin 100 Mg Caps (Gabapentin) .... Three Times A Day 11)  Cymbalta 30 Mg Cpep (Duloxetine Hcl) .... Two By Mouth Once Daily 12)  Nitrostat 0.4 Mg Subl (Nitroglycerin) .Marland Kitchen.. 1 Tablet Under Tongue At Onset of Chest Pain; You May Repeat Every 5 Minutes For Up To 3 Doses. 13)  Aspirin 81 Mg Tbec (Aspirin) .... By Mouth Once Daily 14)  Crestor 10 Mg Tabs (Rosuvastatin Calcium) .Marland Kitchen.. 1 By Mouth At Bedtime 15)  Zegerid 40-1100 Mg Caps (Omeprazole-Sodium Bicarbonate) .... Take One Tablet At Bedtime 16)  Levaquin 500 Mg Tabs (Levofloxacin) .Marland Kitchen.. 1 By Mouth Once Daily  Allergies (verified): 1)  ! Pcn 2)  ! Talwin 3)  ! Codeine 4)  ! * Myocins  Past History:  Family History: Last updated: 09/19/2010 Rectal Cancer: Mother  Heart disease Family History of Arthritis Family History Breast cancer 1st degree relative <50 Family History Diabetes 1st degree relative Family History High cholesterol Family History Hypertension Family History Lung cancer Family History of Sudden Death Family History Osteoporosis Father-deceased, CHF/CAD age 84 Mother-alive, healthy 1 sister-alive and healthy Family History of Irritable Bowel Syndrome: All her 3 childern  Social History: Last updated: 09/19/2010 Retired-nurse Married Childern Former tobacco abuse-stopped August 2011 (Total of  15 years, 1ppd) Alcohol use-yes Drug use-no Regular exercise-no Daily Caffeine Use: coffee  Risk Factors: Alcohol Use: <1 (08/03/2010) Caffeine Use: 0 (08/03/2010) Exercise: no (08/03/2010)  Risk Factors: Smoking Status: current (08/03/2010) Packs/Day: <0.25 (08/03/2010)  Past medical, surgical, family and social histories (including risk factors) reviewed for relevance to current acute and chronic  problems.  Past Medical History: Reviewed history from 08/31/2010 and no changes required. Arthritis Asthma Skin cancer, hx of Chronic Bronchitis Frequent Headaches GERD Heart Arrythmia-Irregular heartbeat HTN Migraines  Polyps in colon Hypothyroidism  Past Surgical History: Reviewed history from 09/13/2010 and no changes required. Breast Biopsy Appendectomy Hysterectomy Tonsillectomy Skin cancer removal Vocal cord cyst removal Sinus Surgery 1980's Repair of right ankle 1996  Family History: Reviewed history from 09/19/2010 and no changes required. Rectal Cancer: Mother  Heart disease Family History of Arthritis Family History Breast cancer 1st degree relative <50 Family History Diabetes 1st degree relative Family History High cholesterol Family History Hypertension Family History Lung cancer Family History of Sudden Death Family History Osteoporosis Father-deceased, CHF/CAD age 65 Mother-alive, healthy 1 sister-alive and healthy Family History of Irritable Bowel Syndrome: All her 3 childern  Social History: Reviewed history from 09/19/2010 and no changes required. Retired-nurse Married Childern Former tobacco abuse-stopped August 2011 (Total of 15 years, 1ppd) Alcohol use-yes Drug use-no Regular exercise-no Daily Caffeine Use: coffee  Review of Systems      See HPI  Physical Exam  General:  Well-developed,well-nourished,in no acute distress; alert,appropriate and cooperative throughout examination Ears:  External ear exam shows no significant lesions or deformities.  Otoscopic examination reveals clear canals, tympanic membranes are intact bilaterally without bulging, retraction, inflammation or discharge. Hearing is grossly normal bilaterally. Nose:  no external deformity, L frontal sinus tenderness, L maxillary sinus tenderness, R frontal sinus tenderness, and R maxillary sinus tenderness.   Mouth:  Oral mucosa and oropharynx without lesions or  exudates.  Teeth in good repair. Neck:  No deformities, masses, or tenderness noted. Lungs:  Normal respiratory effort, chest expands symmetrically. Lungs are clear to auscultation, no crackles or wheezes. Heart:  normal rate and no murmur.   Cervical Nodes:  No lymphadenopathy noted Psych:  Cognition and judgment appear intact. Alert and cooperative with normal attention span and concentration. No apparent delusions, illusions, hallucinations   Impression & Recommendations:  Problem # 1:  SINUSITIS (ICD-473.9)  Her updated medication list for this problem includes:    Nasonex 50 Mcg/act Susp (Mometasone furoate) .Marland Kitchen... 1-2 sprays    Levaquin 500 Mg Tabs (Levofloxacin) .Marland Kitchen... 1 by mouth once daily  Take antibiotics for full duration. Discussed treatment options including indications for coronal CT scan of sinuses and ENT referral.   Complete Medication List: 1)  Hydrochlorothiazide 25 Mg Tabs (Hydrochlorothiazide) .Marland Kitchen.. 1 by mouth daily. 2)  Atenolol 50 Mg Tabs (Atenolol) .... 1/2 tablet by mouth two times a day 3)  Premarin 0.9 Mg Tabs (Estrogens conjugated) .Marland Kitchen.. 1 tab once daily 4)  Trazodone Hcl 100 Mg Tabs (Trazodone hcl) .Marland Kitchen.. 1 tab hs. 5)  Tramadol Hcl 50 Mg Tabs (Tramadol hcl) .Marland Kitchen.. 1 or 2 tabs two times a day 6)  Nasonex 50 Mcg/act Susp (Mometasone furoate) .Marland Kitchen.. 1-2 sprays 7)  Clarinex 5 Mg Tabs (Desloratadine) .Marland Kitchen.. 1 by mouth once daily 8)  Proair Hfa 108 (90 Base) Mcg/act Aers (Albuterol sulfate) 9)  Hydrocortisone Ace-pramoxine 2.5-1 % Crea (Hydrocortisone ace-pramoxine) .... Use as directed. 10)  Neurontin 100 Mg Caps (Gabapentin) .... Three times a day 11)  Cymbalta 30 Mg  Cpep (Duloxetine hcl) .... Two by mouth once daily 12)  Nitrostat 0.4 Mg Subl (Nitroglycerin) .Marland Kitchen.. 1 tablet under tongue at onset of chest pain; you may repeat every 5 minutes for up to 3 doses. 13)  Aspirin 81 Mg Tbec (Aspirin) .... By mouth once daily 14)  Crestor 10 Mg Tabs (Rosuvastatin calcium) .Marland Kitchen.. 1 by  mouth at bedtime 15)  Zegerid 40-1100 Mg Caps (Omeprazole-sodium bicarbonate) .... Take one tablet at bedtime 16)  Levaquin 500 Mg Tabs (Levofloxacin) .Marland Kitchen.. 1 by mouth once daily  Patient Instructions: 1)  Acute sinusitis symptoms for less than 10 days are not helped by antibiotics. Use warm moist compresses, and over the counter decongestants( only as directed). Call if no improvement in 5-7 days, sooner if increasing pain, fever, or new symptoms.  Prescriptions: NASONEX 50 MCG/ACT SUSP (MOMETASONE FUROATE) 1-2 sprays  #1 x 5   Entered and Authorized by:   Loreen Freud DO   Signed by:   Loreen Freud DO on 11/02/2010   Method used:   [facility] to        [facility] [phone]* (retail)       [address]       [place], [place]  [address]       Ph: [phone] or [phone]       Fax: [phone]   RxID:   [id number] LEVAQUIN 500 MG TABS (LEVOFLOXACIN) 1 by mouth once daily  #10 x 0   Entered and Authorized by:   Loreen Freud DO   Signed by:   Loreen Freud DO on 11/02/2010   Method used:   [facility] to        [facility] [phone]* (retail)       [address]       [place], [place]  [address]       Ph: [phone] or [phone]       Fax: [phone]   RxID:   [id number]    Orders Added: 1)  Est. Patient Level III [42706]

## 2011-01-24 NOTE — Letter (Signed)
Summary: EGD Instructions  [facility]  [address] [place], [place] [address]   Phone: [phone]  Fax: [phone]       [name]    [birth year]    MRN: [id number]       Procedure Day /Date:TUESDAY 09/20/2010     Arrival Time: 2:30PM     Procedure Time:3:30PM     Location of Procedure:                    X  [facility] (4th Floor)   PREPARATION FOR ENDOSCOPY/DIL   On9/27/2011THE DAY OF THE PROCEDURE:  1.   No solid foods, milk or milk products are allowed after midnight the night before your procedure.  2.   Do not drink anything colored red or purple.  Avoid juices with pulp.  No orange juice.  3.  You may drink clear liquids until1:30PM, which is 2 hours before your procedure.                                                                                                CLEAR LIQUIDS INCLUDE: Water Jello Ice Popsicles Tea (sugar ok, no milk/cream) Powdered fruit flavored drinks Coffee (sugar ok, no milk/cream) Gatorade Juice: apple, white grape, white cranberry  Lemonade Clear bullion, consomm, broth Carbonated beverages (any kind) Strained chicken noodle soup Hard Candy   MEDICATION INSTRUCTIONS  Unless otherwise instructed, you should take regular prescription medications with a small sip of water as early as possible the morning of your procedure.            OTHER INSTRUCTIONS  You will need a responsible adult at least 66 years of age to accompany you and drive you home.   This person must remain in the waiting room during your procedure.  Wear loose fitting clothing that is easily removed.  Leave jewelry and other valuables at home.  However, you may wish to bring a book to read or an iPod/MP3 player to listen to music as you wait for your procedure to start.  Remove all body piercing jewelry and leave at home.  Total time from sign-in until discharge is approximately 2-3 hours.  You should go home directly  after your procedure and rest.  You can resume normal activities the day after your procedure.  The day of your procedure you should not:   Drive   Make legal decisions   Operate machinery   Drink alcohol   Return to work  You will receive specific instructions about eating, activities and medications before you leave.    The above instructions have been reviewed and explained to me by   _______________________    I fully understand and can verbalize these instructions _____________________________ Date _________

## 2011-01-24 NOTE — Letter (Signed)
Summary: New Patient letter  [facility]  [address] [place], [place] [address]   Phone: [phone]  Fax: [phone]       09/06/2010 MRN: [id number]  [name] [address] [name], [place]  [address]  Dear [name],  Welcome to the Gastroenterology Division at [facility].    You are scheduled to see Dr. Arlyce Dice on 09/19/2010 at 8:30AM on the 3rd floor at [facility], [address]  We ask that you try to arrive at our office 15 minutes prior to your appointment time to allow for check-in.  We would like you to complete the enclosed self-administered evaluation form prior to your visit and bring it with you on the day of your appointment.  We will review it with you.  Also, please bring a complete list of all your medications or, if you prefer, bring the medication bottles and we will list them.  Please bring your insurance card so that we may make a copy of it.  If your insurance requires a referral to see a specialist, please bring your referral form from your primary care physician.  Co-payments are due at the time of your visit and may be paid by cash, check or credit card.     Your office visit will consist of a consult with your physician (includes a physical exam), any laboratory testing he/she may order, scheduling of any necessary diagnostic testing (e.g. x-ray, ultrasound, CT-scan), and scheduling of a procedure (e.g. Endoscopy, Colonoscopy) if required.  Please allow enough time on your schedule to allow for any/all of these possibilities.    If you cannot keep your appointment, please call [phone] to cancel or reschedule prior to your appointment date.  This allows [place] the opportunity to schedule an appointment for another patient in need of care.  If you do not cancel or reschedule by 5 p.m. the business day prior to your appointment date, you will be charged a $50.00 late cancellation/no-show fee.    Thank you for choosing  [facility] for your medical needs.  We appreciate the opportunity to care for you.  Please visit [place] at our website  to learn more about our practice.                     Sincerely,                                                             The Gastroenterology Division

## 2011-01-24 NOTE — Assessment & Plan Note (Signed)
Summary: gerd...as.   History of Present Illness Visit Type: consult  Primary GI MD: Melvia Heaps MD [facility] Primary [name]: Sheri Freud, DO Requesting [name]: Sheri Freud, DO Chief Complaint: GERD, constipation, diarrhea, bloating, belching, and chest pain  History of Present Illness:   [name] is a pleasant 65 year old white female referred at the request of Dr. Laury Axon for evaluation of reflux, dysphagia and change of bowel habits.  She has been complaining of  spontaneous chest pain described as burning pain with radiation to her back.  She underwent cardiac workup and was told to have a partial blockage but it was felt that her chest pain was not due to cardiac pain.  She complains of sore throat and ear pain and was placed on DEXILANT  for assumed esophageal reflux.  She complains of dysphagia to solids.  Pain is nonexertional and not related to swallowing.  The patient also complains of change of bowel habits.  Over the last 8 months she has has been suffering from constipation.  He denies without pain, melena or hematochezia.  Family history is pertinent for mother who developed rectal cancer.  The patient has a history of colon polyps and was last examined several years ago.   GI Review of Systems    Reports acid reflux, belching, bloating, chest pain, dysphagia with solids, heartburn, loss of appetite, and  nausea.      Denies abdominal pain, dysphagia with liquids, vomiting, vomiting blood, weight loss, and  weight gain.      Reports change in bowel habits, constipation, diarrhea, hemorrhoids, irritable bowel syndrome, and  rectal pain.     Denies anal fissure, black tarry stools, diverticulosis, fecal incontinence, heme positive stool, jaundice, light color stool, liver problems, and  rectal bleeding.    Current Medications (verified): 1)  Hydrochlorothiazide 25 Mg Tabs (Hydrochlorothiazide) .Marland Kitchen.. 1 By Mouth Daily. 2)  Atenolol 50 Mg Tabs (Atenolol) .... 1/2 Tablet By Mouth  Two Times A Day 3)  Premarin 0.9 Mg Tabs (Estrogens Conjugated) .Marland Kitchen.. 1 Tab Once Daily 4)  Trazodone Hcl 100 Mg Tabs (Trazodone Hcl) .Marland Kitchen.. 1 Tab Hs. 5)  Tramadol Hcl 50 Mg Tabs (Tramadol Hcl) .Marland Kitchen.. 1 or 2 Tabs Two Times A Day 6)  Methocarbamol 500 Mg Tabs (Methocarbamol) .Marland Kitchen.. 1 or 2 Once Daily 7)  Dexilant 60 Mg Cpdr (Dexlansoprazole) .... One Tablet By Mouth Once Daily As Needed 8)  Nasonex 50 Mcg/act Susp (Mometasone Furoate) .Marland Kitchen.. 1-2 Sprays 9)  Clarinex 5 Mg Tabs (Desloratadine) .Marland Kitchen.. 1 By Mouth Once Daily 10)  Proair Hfa 108 (90 Base) Mcg/act Aers (Albuterol Sulfate) 11)  Hydrocortisone Ace-Pramoxine 2.5-1 % Crea (Hydrocortisone Ace-Pramoxine) .... Use As Directed. 12)  Neurontin 100 Mg Caps (Gabapentin) .... Three Times A Day 13)  Cymbalta 30 Mg Cpep (Duloxetine Hcl) .... Two By Mouth Once Daily 14)  Nitrostat 0.4 Mg Subl (Nitroglycerin) .Marland Kitchen.. 1 Tablet Under Tongue At Onset of Chest Pain; You May Repeat Every 5 Minutes For Up To 3 Doses. 15)  Aspirin 81 Mg Tbec (Aspirin) .... By Mouth Once Daily 16)  Crestor 10 Mg Tabs (Rosuvastatin Calcium) .Marland Kitchen.. 1 By Mouth At Bedtime  Allergies (verified): 1)  ! Pcn 2)  ! Talwin 3)  ! Codeine 4)  ! * Myocins  Past History:  Past Medical History: Reviewed history from 08/31/2010 and no changes required. Arthritis Asthma Skin cancer, hx of Chronic Bronchitis Frequent Headaches GERD Heart Arrythmia-Irregular heartbeat HTN Migraines  Polyps in colon Hypothyroidism  Past Surgical History: Reviewed history  from 09/13/2010 and no changes required. Breast Biopsy Appendectomy Hysterectomy Tonsillectomy Skin cancer removal Vocal cord cyst removal Sinus Surgery 1980's Repair of right ankle 1996  Family History: Rectal Cancer: Mother  Heart disease Family History of Arthritis Family History Breast cancer 1st degree relative <50 Family History Diabetes 1st degree relative Family History High cholesterol Family History Hypertension Family  History Lung cancer Family History of Sudden Death Family History Osteoporosis Father-deceased, CHF/CAD age 110 Mother-alive, healthy 1 sister-alive and healthy Family History of Irritable Bowel Syndrome: All her 3 childern  Social History: Retired-nurse Married Childern Former tobacco abuse-stopped August 2011 (Total of 15 years, 1ppd) Alcohol use-yes Drug use-no Regular exercise-no Daily Caffeine Use: coffee  Review of Systems       The patient complains of arthritis/joint pain, back pain, cough, fatigue, headaches-new, shortness of breath, sleeping problems, sore throat, and voice change.  The patient denies allergy/sinus, anemia, anxiety-new, blood in urine, breast changes/lumps, change in vision, confusion, coughing up blood, depression-new, fainting, fever, hearing problems, heart murmur, heart rhythm changes, itching, menstrual pain, muscle pains/cramps, night sweats, nosebleeds, pregnancy symptoms, skin rash, swelling of feet/legs, swollen lymph glands, thirst - excessive, urination - excessive, urination changes/pain, urine leakage, and vision changes.         All other systems were reviewed and were negative   Vital Signs:  Patient profile:   65 year old female Height:      65 inches Weight:      108 pounds BMI:     18.04 BSA:     1.52 Pulse rate:   64 / minute Pulse rhythm:   regular BP sitting:   124 / 64  (left arm) Cuff size:   regular  Vitals Entered By: Ok Anis CMA (September 19, 2010 8:38 AM)  Physical Exam  Additional Exam:  On physical exam she is a well-developed well-nourished female  skin: anicteric HEENT: normocephalic; PEERLA; no nasal or pharyngeal abnormalities neck: supple nodes: no cervical lymphadenopathy chest: clear to ausculatation and percussion heart: no murmurs, gallops, or rubs abd: soft, nontender; BS normoactive; no abdominal masses, tenderness, organomegaly rectal: deferred ext: no cynanosis, clubbing, edema skeletal: no  deformities neuro: oriented x 3; no focal abnormalities    Impression & Recommendations:  Problem # 1:  CHEST PAIN, NON-CARDIAC (ICD-786.59) Chest pain may be related to GERD and a esophageal stricture.  Recommendations #1 continue DEXILANT #2 upper endoscopy  Risks, alternatives, and complications of the procedure, including bleeding, perforation, and possible need for surgery, were explained to the patient.  Patient's questions were answered.  Problem # 2:  DYSPHAGIA UNSPECIFIED (ICD-787.20)  Rule out early esophageal stricture  Recommendations #1 endoscopy with dilatation as indicated  Orders: EGD (EGD)  Problem # 3:  COLORECTAL CANCER, FAMILY HX (ICD-V16.0)  Plan followup colonoscopy  Orders: Colonoscopy (Colon)  Problem # 4:  CAD, NATIVE VESSEL (ICD-414.01) Assessment: Comment Only  Patient Instructions: 1)  Copy sent to : Sheri Freud, DO 2)  Colonoscopy and Flexible Sigmoidoscopy brochure given.  3)  Conscious Sedation brochure given.  4)  Upper Endoscopy with Dilatation brochure given.  5)  Your Endo is scheduled for 09/20/2010 at 3:30pm 6)  Your colonoscopy is scheduled for 09/29/2010 8AM 7)  The medication list was reviewed and reconciled.  All changed / newly prescribed medications were explained.  A complete medication list was provided to the patient / caregiver. Prescriptions: MOVIPREP 100 GM  SOLR (PEG-KCL-NACL-NASULF-NA ASC-C) As per prep instructions.  #1 x 0   Entered  by:   Merri Ray CMA (AAMA)   Authorized by:   Louis Meckel MD   Signed by:   Merri Ray CMA (AAMA) on 09/19/2010   Method used:   Electronically to        [facility] [phone]* (retail)       [address]       [place], [place]  [address]       Ph: [phone] or [phone]       Fax: [phone]   RxID:   [id number]

## 2011-01-24 NOTE — Assessment & Plan Note (Signed)
Summary: f/u from procedures--ch.   History of Present Illness Visit Type: Follow-up Visit Primary GI MD: Melvia Heaps MD [facility] Primary Provider: Loreen Freud, DO Requesting Provider: Loreen Freud, DO Chief Complaint: GERD/ Chronic constipation History of Present Illness:   [name] has returned following upper endoscopy with dilatation,  and colonoscopy.  The former demonstrated an early esophageal stricture.  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  She continues to complain of regurgitation of  burning gastric contents, especially after retiring.  He has difficulty swallowing solids despite dilatation.  She remains constipated.  Symptoms have now improved since taking DEXILANT although they are worsened if she stops excellent.   GI Review of Systems    Reports acid reflux, bloating, and  loss of appetite.      Denies abdominal pain, belching, chest pain, dysphagia with liquids, dysphagia with solids, heartburn, nausea, vomiting, vomiting blood, weight loss, and  weight gain.      Reports constipation.     Denies anal fissure, black tarry stools, change in bowel habit, diarrhea, diverticulosis, fecal incontinence, heme positive stool, hemorrhoids, irritable bowel syndrome, jaundice, light color stool, liver problems, rectal bleeding, and  rectal pain.    Current Medications (verified): 1)  Hydrochlorothiazide 25 Mg Tabs (Hydrochlorothiazide) .Marland Kitchen.. 1 By Mouth Daily. 2)  Atenolol 50 Mg Tabs (Atenolol) .... 1/2 Tablet By Mouth Two Times A Day 3)  Premarin 0.9 Mg Tabs (Estrogens Conjugated) .Marland Kitchen.. 1 Tab Once Daily 4)  Trazodone Hcl 100 Mg Tabs (Trazodone Hcl) .Marland Kitchen.. 1 Tab Hs. 5)  Tramadol Hcl 50 Mg Tabs (Tramadol Hcl) .Marland Kitchen.. 1 or 2 Tabs Two Times A Day 6)  Dexilant 60 Mg Cpdr (Dexlansoprazole) .... One Tablet By Mouth Once Daily As Needed 7)  Nasonex 50 Mcg/act Susp (Mometasone Furoate) .Marland Kitchen.. 1-2 Sprays 8)  Clarinex 5 Mg Tabs (Desloratadine) .Marland Kitchen.. 1 By Mouth Once Daily 9)  Proair Hfa 108  (90 Base) Mcg/act Aers (Albuterol Sulfate) 10)  Hydrocortisone Ace-Pramoxine 2.5-1 % Crea (Hydrocortisone Ace-Pramoxine) .... Use As Directed. 11)  Neurontin 100 Mg Caps (Gabapentin) .... Three Times A Day 12)  Cymbalta 30 Mg Cpep (Duloxetine Hcl) .... Two By Mouth Once Daily 13)  Nitrostat 0.4 Mg Subl (Nitroglycerin) .Marland Kitchen.. 1 Tablet Under Tongue At Onset of Chest Pain; You May Repeat Every 5 Minutes For Up To 3 Doses. 14)  Aspirin 81 Mg Tbec (Aspirin) .... By Mouth Once Daily 15)  Crestor 10 Mg Tabs (Rosuvastatin Calcium) .Marland Kitchen.. 1 By Mouth At Bedtime  Allergies (verified): 1)  ! Pcn 2)  ! Talwin 3)  ! Codeine 4)  ! * Myocins  Past History:  Past Medical History: Reviewed history from 08/31/2010 and no changes required. Arthritis Asthma Skin cancer, hx of Chronic Bronchitis Frequent Headaches GERD Heart Arrythmia-Irregular heartbeat HTN Migraines  Polyps in colon Hypothyroidism  Past Surgical History: Reviewed history from 09/13/2010 and no changes required. Breast Biopsy Appendectomy Hysterectomy Tonsillectomy Skin cancer removal Vocal cord cyst removal Sinus Surgery 1980's Repair of right ankle 1996  Family History: Reviewed history from 09/19/2010 and no changes required. Rectal Cancer: Mother  Heart disease Family History of Arthritis Family History Breast cancer 1st degree relative <50 Family History Diabetes 1st degree relative Family History High cholesterol Family History Hypertension Family History Lung cancer Family History of Sudden Death Family History Osteoporosis Father-deceased, CHF/CAD age 36 Mother-alive, healthy 1 sister-alive and healthy Family History of Irritable Bowel Syndrome: All her 3 childern  Social History: Reviewed history from 09/19/2010 and no changes required. Retired-nurse  Married Childern Former tobacco abuse-stopped August 2011 (Total of 15 years, 1ppd) Alcohol use-yes Drug use-no Regular exercise-no Daily Caffeine Use:  coffee  Review of Systems       The patient complains of arthritis/joint pain.  The patient denies allergy/sinus, anemia, anxiety-new, back pain, blood in urine, breast changes/lumps, change in vision, confusion, cough, coughing up blood, depression-new, fainting, fatigue, fever, headaches-new, hearing problems, heart murmur, heart rhythm changes, itching, menstrual pain, muscle pains/cramps, night sweats, nosebleeds, pregnancy symptoms, shortness of breath, skin rash, sleeping problems, sore throat, swelling of feet/legs, swollen lymph glands, thirst - excessive, urination - excessive, urination changes/pain, urine leakage, vision changes, and voice change.    Vital Signs:  Patient profile:   65 year old female Height:      65 inches Weight:      112.38 pounds BMI:     18.77 Pulse rate:   60 / minute Pulse rhythm:   regular BP sitting:   120 / 70  (left arm) Cuff size:   regular  Vitals Entered By: June McMurray CMA Duncan Dull) (November 02, 2010 9:56 AM)   Impression & Recommendations:  Problem # 1:  DYSPHAGIA UNSPECIFIED (ICD-787.20) Assessment Unchanged  Plan barium swallow looking for possible dysmotility of the esophagus and persistent stricture  Orders: Barium Swallow (Barium Swallow)  Problem # 2:  GERD (ICD-530.81)  Plan trial of Zegerid for nocturnal GERD  Orders: Barium Swallow (Barium Swallow)  Problem # 3:  CONSTIPATION (ICD-564.00) Plan fiber supplementation  Patient Instructions: 1)  Copy sent to : Loreen Freud, DO 2)  Your Barium Swallow is scheduled on 11/04/2010 at 9am at [facility] 3)  The medication list was reviewed and reconciled.  All changed / newly prescribed medications were explained.  A complete medication list was provided to the patient / caregiver. Prescriptions: ZEGERID 40-1100 MG CAPS (OMEPRAZOLE-SODIUM BICARBONATE) take one tablet at bedtime  #15 x 2   Entered by:   Merri Ray CMA (AAMA)   Authorized by:   Louis Meckel MD    Signed by:   Merri Ray CMA (AAMA) on 11/02/2010   Method used:   [facility] to        [facility] [phone][facility] (retail)       [address]       [place], [place]  [address]       Ph: [phone] or [phone]       Fax: [phone]   RxID:   [id number]

## 2011-01-24 NOTE — Letter (Signed)
Summary: [facility] Instructions  [facility]  [address] [place], [place] [address]   Phone: [phone]  Fax: [phone]       UILANI SANVILLE    04/24/1946    MRN: [id number]        Procedure Day /Date:THURSDAY 09/29/2010     Arrival Time:7:30AM     Procedure Time:8:00AM     Location of Procedure:                    X   [facility] (4th Floor)   PREPARATION FOR COLONOSCOPY WITH MOVIPREP   Starting 5 days prior to your procedure 10/1/2011do not eat nuts, seeds, popcorn, corn, beans, peas,  salads, or any raw vegetables.  Do not take any fiber supplements (e.g. Metamucil, Citrucel, and Benefiber).  THE DAY BEFORE YOUR PROCEDURE         DATE: 09/28/2010  DAY: WEDNESDAY  1.  Drink clear liquids the entire day-NO SOLID FOOD  2.  Do not drink anything colored red or purple.  Avoid juices with pulp.  No orange juice.  3.  Drink at least 64 oz. (8 glasses) of fluid/clear liquids during the day to prevent dehydration and help the prep work efficiently.  CLEAR LIQUIDS INCLUDE: Water Jello Ice Popsicles Tea (sugar ok, no milk/cream) Powdered fruit flavored drinks Coffee (sugar ok, no milk/cream) Gatorade Juice: apple, white grape, white cranberry  Lemonade Clear bullion, consomm, broth Carbonated beverages (any kind) Strained chicken noodle soup Hard Candy                             4.  In the morning, mix first dose of MoviPrep solution:    Empty 1 Pouch A and 1 Pouch B into the disposable container    Add lukewarm drinking water to the top line of the container. Mix to dissolve    Refrigerate (mixed solution should be used within 24 hrs)  5.  Begin drinking the prep at 5:00 p.m. The MoviPrep container is divided by 4 marks.   Every 15 minutes drink the solution down to the next mark (approximately 8 oz) until the full liter is complete.   6.  Follow completed prep with 16 oz of clear liquid of your choice (Nothing red or purple).   Continue to drink clear liquids until bedtime.  7.  Before going to bed, mix second dose of MoviPrep solution:    Empty 1 Pouch A and 1 Pouch B into the disposable container    Add lukewarm drinking water to the top line of the container. Mix to dissolve    Refrigerate  THE DAY OF YOUR PROCEDURE      DATE: 09/29/2010 DAY: THURSDAY  Beginning at 3a.m. (5 hours before procedure):         1. Every 15 minutes, drink the solution down to the next mark (approx 8 oz) until the full liter is complete.  2. Follow completed prep with 16 oz. of clear liquid of your choice.    3. You may drink clear liquids until 6AM (2 HOURS BEFORE PROCEDURE).   MEDICATION INSTRUCTIONS  Unless otherwise instructed, you should take regular prescription medications with a small sip of water   as early as possible the morning of your procedure.          OTHER INSTRUCTIONS  You will need a responsible adult at least 66 years of age to accompany you  and drive you home.   This person must remain in the waiting room during your procedure.  Wear loose fitting clothing that is easily removed.  Leave jewelry and other valuables at home.  However, you may wish to bring a book to read or  an iPod/MP3 player to listen to music as you wait for your procedure to start.  Remove all body piercing jewelry and leave at home.  Total time from sign-in until discharge is approximately 2-3 hours.  You should go home directly after your procedure and rest.  You can resume normal activities the  day after your procedure.  The day of your procedure you should not:   Drive   Make legal decisions   Operate machinery   Drink alcohol   Return to work  You will receive specific instructions about eating, activities and medications before you leave.    The above instructions have been reviewed and explained to me by   _______________________    I fully understand and can verbalize these instructions  _____________________________ Date _________

## 2011-01-24 NOTE — Assessment & Plan Note (Signed)
Summary: possible pneumonia//lch   Vital Signs:  Patient profile:   65 year old female Weight:      113.8 pounds O2 Sat:      93 % on Room air Temp:     97.8 degrees F oral Pulse rate:   94 / minute Pulse rhythm:   regular BP sitting:   116 / 60  (left arm) Cuff size:   small  Vitals Entered By: Almeta Monas CMA Duncan Dull) (November 24, 2010 3:50 PM)  O2 Flow:  Room air CC: x3weeks c/o cough, congestion, no energy, sinus pain on the right side of the face, Cough   History of Present Illness:  Cough      This is a 65 year old woman who presents with Cough.  The symptoms began 2 months ago.  The patient reports productive cough, shortness of breath, and wheezing, but denies non-productive cough, pleuritic chest pain, exertional dyspnea, fever, hemoptysis, and malaise.  Associated symtpoms include cold/URI symptoms and nasal congestion.  The patient denies the following symptoms: sore throat, chronic rhinitis, weight loss, acid reflux symptoms, and peripheral edema.  The cough is worse with activity and lying down.  Ineffective prior treatments have included OTC cough medication, albuterol inhaler, and other asthma medication.  Risk factors include history of asthma.    Preventive Screening-Counseling & Management  Alcohol-Tobacco     Alcohol drinks/day: <1     Smoking Status: current     Smoking Cessation Counseling: yes     Packs/Day: <0.25     Year Started: 1967     Year Quit: quit several times--started 2 years ago  Caffeine-Diet-Exercise     Caffeine use/day: 0     Does Patient Exercise: no  Current Medications (verified): 1)  Hydrochlorothiazide 25 Mg Tabs (Hydrochlorothiazide) .Marland Kitchen.. 1 By Mouth Daily. 2)  Atenolol 50 Mg Tabs (Atenolol) .... 1/2 Tablet By Mouth Two Times A Day 3)  Premarin 0.9 Mg Tabs (Estrogens Conjugated) .Marland Kitchen.. 1 Tab Once Daily 4)  Trazodone Hcl 100 Mg Tabs (Trazodone Hcl) .Marland Kitchen.. 1 Tab Hs. 5)  Tramadol Hcl 50 Mg Tabs (Tramadol Hcl) .Marland Kitchen.. 1 or 2 Tabs Two Times  A Day 6)  Nasonex 50 Mcg/act Susp (Mometasone Furoate) .Marland Kitchen.. 1-2 Sprays 7)  Clarinex 5 Mg Tabs (Desloratadine) .Marland Kitchen.. 1 By Mouth Once Daily 8)  Proair Hfa 108 (90 Base) Mcg/act Aers (Albuterol Sulfate) 9)  Hydrocortisone Ace-Pramoxine 2.5-1 % Crea (Hydrocortisone Ace-Pramoxine) .... Use As Directed. 10)  Neurontin 100 Mg Caps (Gabapentin) .... Three Times A Day 11)  Cymbalta 30 Mg Cpep (Duloxetine Hcl) .... Two By Mouth Once Daily 12)  Nitrostat 0.4 Mg Subl (Nitroglycerin) .Marland Kitchen.. 1 Tablet Under Tongue At Onset of Chest Pain; You May Repeat Every 5 Minutes For Up To 3 Doses. 13)  Aspirin 81 Mg Tbec (Aspirin) .... By Mouth Once Daily 14)  Crestor 10 Mg Tabs (Rosuvastatin Calcium) .Marland Kitchen.. 1 By Mouth At Bedtime 15)  Zegerid 40-1100 Mg Caps (Omeprazole-Sodium Bicarbonate) .... Take One Tablet At Bedtime 16)  Biaxin Xl 500 Mg Xr24h-Tab (Clarithromycin) .... 2 By Mouth Once Daily  Allergies (verified): 1)  ! Pcn 2)  ! Talwin 3)  ! Codeine 4)  ! * Myocins  Past History:  Past Medical History: Last updated: 08/31/2010 Arthritis Asthma Skin cancer, hx of Chronic Bronchitis Frequent Headaches GERD Heart Arrythmia-Irregular heartbeat HTN Migraines  Polyps in colon Hypothyroidism  Past Surgical History: Last updated: 09/13/2010 Breast Biopsy Appendectomy Hysterectomy Tonsillectomy Skin cancer removal Vocal cord cyst removal  Sinus Surgery 1980's Repair of right ankle 1996  Family History: Last updated: 09/19/2010 Rectal Cancer: Mother  Heart disease Family History of Arthritis Family History Breast cancer 1st degree relative <50 Family History Diabetes 1st degree relative Family History High cholesterol Family History Hypertension Family History Lung cancer Family History of Sudden Death Family History Osteoporosis Father-deceased, CHF/CAD age 70 Mother-alive, healthy 1 sister-alive and healthy Family History of Irritable Bowel Syndrome: All her 3 childern  Social  History: Last updated: 09/19/2010 Retired-nurse Married Childern Former tobacco abuse-stopped August 2011 (Total of 15 years, 1ppd) Alcohol use-yes Drug use-no Regular exercise-no Daily Caffeine Use: coffee  Risk Factors: Alcohol Use: <1 (11/24/2010) Caffeine Use: 0 (11/24/2010) Exercise: no (11/24/2010)  Risk Factors: Smoking Status: current (11/24/2010) Packs/Day: <0.25 (11/24/2010)  Family History: Reviewed history from 09/19/2010 and no changes required. Rectal Cancer: Mother  Heart disease Family History of Arthritis Family History Breast cancer 1st degree relative <50 Family History Diabetes 1st degree relative Family History High cholesterol Family History Hypertension Family History Lung cancer Family History of Sudden Death Family History Osteoporosis Father-deceased, CHF/CAD age 13 Mother-alive, healthy 1 sister-alive and healthy Family History of Irritable Bowel Syndrome: All her 3 childern  Social History: Reviewed history from 09/19/2010 and no changes required. Retired-nurse Married Childern Former tobacco abuse-stopped August 2011 (Total of 15 years, 1ppd) Alcohol use-yes Drug use-no Regular exercise-no Daily Caffeine Use: coffee  Review of Systems      See HPI  Physical Exam  General:  Well-developed,well-nourished,in no acute distress; alert,appropriate and cooperative throughout examination Ears:  External ear exam shows no significant lesions or deformities.  Otoscopic examination reveals clear canals, tympanic membranes are intact bilaterally without bulging, retraction, inflammation or discharge. Hearing is grossly normal bilaterally. Nose:  L frontal sinus tenderness, L maxillary sinus tenderness, R frontal sinus tenderness, and R maxillary sinus tenderness.   Mouth:  pharyngeal erythema and postnasal drip.   Neck:  No deformities, masses, or tenderness noted. Lungs:  R decreased breath sounds and L decreased breath sounds.   Heart:   Normal rate and regular rhythm. S1 and S2 normal without gallop, murmur, click, rub or other extra sounds. Cervical Nodes:  No lymphadenopathy noted Psych:  Cognition and judgment appear intact. Alert and cooperative with normal attention span and concentration. No apparent delusions, illusions, hallucinations   Impression & Recommendations:  Problem # 1:  ACUTE BRONCHITIS (ICD-466.0)  Her updated medication list for this problem includes:    Proair Hfa 108 (90 Base) Mcg/act Aers (Albuterol sulfate)    Biaxin Xl 500 Mg Xr24h-tab (Clarithromycin) .Marland Kitchen... 2 by mouth once daily  Take antibiotics and other medications as directed. Encouraged to push clear liquids, get enough rest, and take acetaminophen as needed. To be seen in 5-7 days if no improvement, sooner if worse.----- Hold Crestor while taking abx---pt understands Will refer to pulmonary if symptoms do not improve  Orders: T-2 View CXR (71020TC)  Complete Medication List: 1)  Hydrochlorothiazide 25 Mg Tabs (Hydrochlorothiazide) .Marland Kitchen.. 1 by mouth daily. 2)  Atenolol 50 Mg Tabs (Atenolol) .... 1/2 tablet by mouth two times a day 3)  Premarin 0.9 Mg Tabs (Estrogens conjugated) .Marland Kitchen.. 1 tab once daily 4)  Trazodone Hcl 100 Mg Tabs (Trazodone hcl) .Marland Kitchen.. 1 tab hs. 5)  Tramadol Hcl 50 Mg Tabs (Tramadol hcl) .Marland Kitchen.. 1 or 2 tabs two times a day 6)  Nasonex 50 Mcg/act Susp (Mometasone furoate) .Marland Kitchen.. 1-2 sprays 7)  Clarinex 5 Mg Tabs (Desloratadine) .Marland Kitchen.. 1 by mouth once daily  8)  Proair Hfa 108 (90 Base) Mcg/act Aers (Albuterol sulfate) 9)  Hydrocortisone Ace-pramoxine 2.5-1 % Crea (Hydrocortisone ace-pramoxine) .... Use as directed. 10)  Neurontin 100 Mg Caps (Gabapentin) .... Three times a day 11)  Cymbalta 30 Mg Cpep (Duloxetine hcl) .... Two by mouth once daily 12)  Nitrostat 0.4 Mg Subl (Nitroglycerin) .Marland Kitchen.. 1 tablet under tongue at onset of chest pain; you may repeat every 5 minutes for up to 3 doses. 13)  Aspirin 81 Mg Tbec (Aspirin) .... By  mouth once daily 14)  Crestor 10 Mg Tabs (Rosuvastatin calcium) .Marland Kitchen.. 1 by mouth at bedtime 15)  Zegerid 40-1100 Mg Caps (Omeprazole-sodium bicarbonate) .... Take one tablet at bedtime 16)  Biaxin Xl 500 Mg Xr24h-tab (Clarithromycin) .... 2 by mouth once daily Prescriptions: BIAXIN XL 500 MG XR24H-TAB (CLARITHROMYCIN) 2 by mouth once daily  #14 x 0   Entered and Authorized by:   Loreen Freud DO   Signed by:   Loreen Freud DO on 11/24/2010   Method used:   [facility]        [phone]       [address] [place], [place]  [address]       Ph: [phone] or [phone]       Fax: [phone]   RxID:   [id number]    Orders Added: 1)  T-2 View CXR [71020TC] 2)  Est. Patient Level III [34742]

## 2011-01-24 NOTE — Miscellaneous (Signed)
Summary: Appointment Canceled  Appointment status changed to canceled by [employer] on 09/23/2010 10:46 AM.  Cancellation Comments --------------------- 433.10/lg  Appointment Information ----------------------- Appt Type:  CARDIOLOGY ANCILLARY VISIT      Date:  Wednesday, November 09, 2010      Time:  7:30 AM for 60 min   Urgency:  Routine   Made By:  Pearson Grippe  To Visit:  [id number]    Reason:  433.10/lg  Appt Comments ------------- -- 09/23/10 10:46: (CEM[facility]) CANCELED -- 433.10/lg -- 08/31/10 15:25: ([facility]EM[facility]) BOOKED -- Routine CARDIOLOGY ANCILLARY VISIT at 11/09/2010 7:30 AM for 60 min 433.10/lg

## 2011-01-24 NOTE — Progress Notes (Signed)
Summary: requesting rx for valium  Phone Note Call from Patient   Caller: Patient Reason for Call: Talk to Nurse Summary of Call: pt was given valium at the hospital to calm her down-having chest tightness and sob and the valium helps-pt calling uncontrolled crying and would like a [facility]ill [facility] [address]pt [phone] Initial call taken by: Glynda Jaeger,  August 05, 2010 12:24 PM  Follow-up for Phone Call        spoke with dr Sanjuana Kava, script for valium 2.5mg  as needed, #10 only, for refills she will need to talk with primary care. pt aware. Deliah Goody, RN  August 05, 2010 12:40 PM     New/Updated Medications: VALIUM 2 MG TABS (DIAZEPAM) one tablet as needed Prescriptions: VALIUM 2 MG TABS (DIAZEPAM) one tablet as needed  #10 x 0   Entered by:   Deliah Goody, RN   Authorized by:   Verne Carrow, MD   Signed by:   Deliah Goody, RN on 08/05/2010   Method used:   Telephoned to ...       [facility]  [address] [phone]* (retail)       [address] [place], [place]  [address]       Ph: [phone] or [phone]       Fax: [phone]   RxID:   [id number]

## 2011-01-24 NOTE — Letter (Signed)
Summary: [facility]  [facility]   Imported By: Lanelle Bal 02/02/2010 09:43:57  _____________________________________________________________________  External Attachment:    Type:   Image     Comment:   External Document

## 2011-01-24 NOTE — Progress Notes (Signed)
Summary: can pt have tooth pulled tomorrow  Phone Note Call from Patient Call back at Home Phone [phone]   Caller: Patient Reason for Call: Talk to Nurse, Talk to Doctor Summary of Call: pt dentist is very concerned about clearence because pt just had a cath and she needs to have tooth pulled tomorrow and wants to make sure it is ok Initial call taken by: Omer Jack,  August 10, 2010 11:48 AM  Follow-up for Phone Call        I spoke with the pt and made her aware that she is okay to have a tooth pulled.  The pt had a cath that showed mild non-obstructive coronary disease.  The pt was diagnosed with non-cardiac CP. Follow-up by: Julieta Gutting, RN, BSN,  August 10, 2010 12:01 PM

## 2011-01-24 NOTE — Letter (Signed)
Summary: New Patient Letter  [facility]  [address] [place], [place] [address]   Phone: [phone]  Fax: [phone]       01/05/2010 MRN: [id number]  [name] [address] [place], [place]  [address]  Dear [name],   Welcome to [facility] and thank you for choosing [place] as your Primary Care Providers. Enclosed you will find information about our practice that we hope you find helpful. We have also enclosed forms to be filled out prior to your visit. This will provide [place] with the necessary information and facilitate your being seen in a timely manner. If you have any questions, please call us at:  [phone]        and we will be happy to assist you. We look forward to seeing you at your scheduled appointment time.  Appointment   [facility] [phone] @ 2:15            with Dr.    Laury Axon             Sincerely,  Primary Health Care Team  Please arrive 15 minutes early for your first appointment and bring your insurance card. Co-pay is required at the time of your visit.  *****Please call the office if you are not able to keep this appointment. There is a charge of $50.00 if any appointment is not cancelled or rescheduled within 24 hours.

## 2011-01-24 NOTE — Letter (Signed)
Summary: [facility]  [facility]   Imported By: Lanelle Bal 03/30/2010 12:37:08  _____________________________________________________________________  External Attachment:    Type:   Image     Comment:   External Document

## 2011-01-24 NOTE — Progress Notes (Signed)
Summary: multiple issues  Phone Note Call from Patient   Caller: Patient Summary of Call: patient said she has been tired - sob for a while - breast tender for 3 months her grand-daughter bumped her & has been sore since - patient has multiple issues - spoke with Tama Gander ok for monday [id number] appt - to allow time with md - patient advise if sob - pain or discomfort she is to go to ed  Initial call taken by: Okey Regal Spring,  June 09, 2010 9:28 AM

## 2011-01-24 NOTE — Procedures (Signed)
Summary: Colonoscopy  Patient: [name] Note: All result statuses are Final unless otherwise noted.  Tests: (1) Colonoscopy (COL)   COL Colonoscopy           DONE     [facility]     [address]     [place], [place]  [address]           COLONOSCOPY PROCEDURE REPORT           PATIENT:  [name]  MR#:  [id number]     BIRTHDATE:  1946-10-25, 64 yrs. old  GENDER:  female           ENDOSCOPIST:  Barbette Hair. Arlyce Dice, MD     Referred by:           PROCEDURE DATE:  09/29/2010     PROCEDURE:  Diagnostic Colonoscopy     ASA CLASS:  Class II     INDICATIONS:  1) screening  2) family history of colon cancer  3)     history of pre-cancerous (adenomatous) colon polyps Mother           MEDICATIONS:   Fentanyl 50 mcg IV, Versed 5 mg IV           DESCRIPTION OF PROCEDURE:   After the risks benefits and     alternatives of the procedure were thoroughly explained, informed     consent was obtained.  Digital rectal exam was performed and     revealed no abnormalities.   The LB 180AL [id number] endoscope was     introduced through the anus and advanced to the cecum, which was     identified by both the appendix and ileocecal valve, without     limitations.  The quality of the prep was excellent, using     MiraLax.  The instrument was then slowly withdrawn as the colon     was fully examined.     <<PROCEDUREIMAGES>>           FINDINGS:  Melanosis coli was found. Moderate melanosis throughout     the colon  This was otherwise a normal examination of the colon     (see image1, image2, image4, image5, image6, image8, image10,     image13, and image14).   Retroflexed views in the rectum revealed     no abnormalities.    The time to cecum =  4.75  minutes. The scope     was then withdrawn (time =  6.0  min) from the patient and the     procedure completed.           COMPLICATIONS:  None           ENDOSCOPIC IMPRESSION:     1) Melanosis     RECOMMENDATIONS:     1) Given your  significant family history of colon cancer, you     should have a repeat colonoscopy in 5 years     2) high fiber diet           REPEAT EXAM:  In 5 year(s) for Colonoscopy.           ______________________________     Barbette Hair. Arlyce Dice, MD           CC: Lelon Perla, DO           n.     eSIGNED:   Barbette Hair. Kaplan at 09/29/2010 08:55 AM           [name], [id number]  Note: An exclamation mark (!) indicates a result that was not dispersed into the flowsheet. Document Creation Date: 09/29/2010 8:55 AM _______________________________________________________________________  (1) Order result status: Final Collection or observation date-time: 09/29/2010 08:48 Requested date-time:  Receipt date-time:  Reported date-time:  Referring Physician:   Ordering Physician: Melvia Heaps [phone]) Specimen Source:  Source: Launa Grill Order Number: [id number] Lab site:   Appended Document: Colonoscopy    Clinical Lists Changes  Observations: Added new observation of COLONNXTDUE: 09/2015 (09/29/2010 10:31)

## 2011-01-24 NOTE — Progress Notes (Signed)
Summary: prior auth APPROVED dexilant  Phone Note Refill Request Message from:  Fax from Pharmacy on September 30, 2010 3:21 PM  Refills Requested: Medication #1:  DEXILANT 60 MG CPDR one tablet by mouth once daily as needed prior Berkley Harvey - [id number] - id [id number]  Initial call taken by: Okey Regal Spring,  September 30, 2010 3:22 PM  Follow-up for Phone Call        tried calling pt to advise we need to complete prior authorizaton.... No Answer.  Almeta Monas CMA Duncan Dull)  September 30, 2010 4:36 PM   Additional Follow-up for Phone Call Additional follow up Details #1::        CASE #[id number]  Prior auth completed over phone.PRIOR AUTH APPROVED 09-12-10 UNTIL 10-03-11..............Marland KitchenFelecia Deloach CMA  October 03, 2010 3:20 PM   Pharmacy faxed, approval letter scan to chart............Marland KitchenFelecia Deloach CMA  October 04, 2010 8:12 AM

## 2011-01-24 NOTE — Procedures (Signed)
Summary: Upper Endoscopy  Patient: [name] Note: All result statuses are Final unless otherwise noted.  Tests: (1) Upper Endoscopy (EGD)   EGD Upper Endoscopy       DONE     [facility]     [address]     [place], [place]  [address]           ENDOSCOPY PROCEDURE REPORT           PATIENT:  Sheri, [name]  MR#:  [id number]     BIRTHDATE:  September 23, 1946, 64 yrs. old  GENDER:  female           ENDOSCOPIST:  Barbette Hair. Arlyce Dice, MD     Referred by:  Loreen Freud, DO           PROCEDURE DATE:  09/20/2010     PROCEDURE:  EGD, diagnostic, Elease Hashimoto Dilation of Esophagus     ASA CLASS:  Class II     INDICATIONS:  chest pain, dysphagia           MEDICATIONS:   Fentanyl 50 mcg IV, Versed 8 mg IV, glycopyrrolate     (Robinal) 0.2 mg IV, 0.6cc simethancone 0.6 cc PO     TOPICAL ANESTHETIC:  Exactacain Spray           DESCRIPTION OF PROCEDURE:   After the risks benefits and     alternatives of the procedure were thoroughly explained, informed     consent was obtained.  The LB [id number] [id number] endoscope was     introduced through the mouth and advanced to the third portion of     the duodenum, without limitations.  The instrument was slowly     withdrawn as the mucosa was fully examined.     <<PROCEDUREIMAGES>>           A stricture was found at the gastroesophageal junction (see     image1). Early esophageal stricture Dilation with maloney dilator     18mm Mild resistance; no heme  other findings.    Retroflexed     views revealed no abnormalities.    The scope was then withdrawn     from the patient and the procedure completed.           COMPLICATIONS:  None           ENDOSCOPIC IMPRESSION:     1) Stricture at the gastroesophageal junction     2) Other findings     RECOMMENDATIONS:     1) continue PPI - dexilant     2) Call office next 2-3 days to schedule an office appointment     for 4-6 weeks           REPEAT EXAM:  No           ______________________________  Barbette Hair. Arlyce Dice, MD           CC:           n.     eSIGNED:   Barbette Hair. Kaplan at 09/20/2010 04:19 PM           Alexis Goodell, [id number]  Note: An exclamation mark (!) indicates a result that was not dispersed into the flowsheet. Document Creation Date: 09/20/2010 4:19 PM _______________________________________________________________________  (1) Order result status: Final Collection or observation date-time: 09/20/2010 16:14 Requested date-time:  Receipt date-time:  Reported date-time:  Referring Physician:   Ordering Physician: Melvia Heaps [phone]) Specimen Source:  Source: Launa Grill Order Number: [id number] Lab  site:

## 2011-01-24 NOTE — Medication Information (Signed)
Summary: Prior Authorization & Approval for Dexilant/[employer]  Prior Authorization & Approval for Dexilant/[employer]   Imported By: Lanelle Bal 03/16/2010 10:10:13  _____________________________________________________________________  External Attachment:    Type:   Image     Comment:   External Document

## 2011-01-24 NOTE — Medication Information (Signed)
Summary: Approval for Dexilant/[employer]  Approval for Dexilant/[employer]   Imported By: Lanelle Bal 10/11/2010 14:50:45  _____________________________________________________________________  External Attachment:    Type:   Image     Comment:   External Document

## 2011-01-24 NOTE — Assessment & Plan Note (Signed)
Summary: COUGH//RH////SPH   Vital Signs:  Patient profile:   65 year old female Weight:      102 pounds Temp:     98.3 degrees F oral Pulse rate:   82 / minute Pulse rhythm:   regular BP sitting:   120 / 78  (left arm) Cuff size:   regular  Vitals Entered By: Army Fossa CMA (March 10, 2010 11:36 AM) CC: Pt c/o congestion in chest, HA, pressure in face x 3 weeks., URI symptoms   History of Present Illness:       This is a 65 year old woman who presents with URI symptoms.  The symptoms began 3 weeks ago.  Pt has used proair with relief,  she is also using neti pot and claritin.  The patient complains of nasal congestion, earache, and sick contacts, but denies clear nasal discharge, sore throat, dry cough, and productive cough.  The patient denies fever, low-grade fever (<100.5 degrees), fever of 100.5-103 degrees, fever of 103.1-104 degrees, fever to >104 degrees, stiff neck, dyspnea, wheezing, rash, vomiting, diarrhea, use of an antipyretic, and response to antipyretic.  The patient also reports headache.  The patient denies itchy watery eyes, itchy throat, sneezing, seasonal symptoms, response to antihistamine, muscle aches, and severe fatigue.  The patient denies the following risk factors for Strep sinusitis: unilateral facial pain, unilateral nasal discharge, poor response to decongestant, double sickening, tooth pain, Strep exposure, tender adenopathy, and absence of cough.  B/L sinus headache / pressure.    Current Medications (verified): 1)  Hydrochlorothiazide 25 Mg Tabs (Hydrochlorothiazide) .Marland Kitchen.. 1 By Mouth Daily. 2)  Atenolol 50 Mg Tabs (Atenolol) .... 1/2 Tab Two Times A Day 3)  Premarin 0.9 Mg Tabs (Estrogens Conjugated) .Marland Kitchen.. 1 Tab Once Daily 4)  Cymbalta 60 Mg Cpep (Duloxetine Hcl) .Marland Kitchen.. 1 Tab Once Daily 5)  Trazodone Hcl 100 Mg Tabs (Trazodone Hcl) .Marland Kitchen.. 1 Tab Hs. 6)  Tramadol Hcl 50 Mg Tabs (Tramadol Hcl) .Marland Kitchen.. 1 or 2 Tabs Two Times A Day 7)  Methocarbamol 500 Mg Tabs  (Methocarbamol) .Marland Kitchen.. 1 or 2 Once Daily 8)  Dexilant 60 Mg Cpdr (Dexlansoprazole) .Marland Kitchen.. 1 By Mouth Once Daily 9)  Nasonex 50 Mcg/act Susp (Mometasone Furoate) .Marland Kitchen.. 1-2 Sprays 10)  Clarinex 5 Mg Tabs (Desloratadine) .Marland Kitchen.. 1 By Mouth Once Daily 11)  Hydrocodone-Acetaminophen 5-500 Mg Tabs (Hydrocodone-Acetaminophen) .Marland Kitchen.. 1-2 Tabs Q 4-6 Hrs 12)  Proair Hfa 108 (90 Base) Mcg/act Aers (Albuterol Sulfate) 13)  Hydrocortisone Ace-Pramoxine 2.5-1 % Crea (Hydrocortisone Ace-Pramoxine) .... Use As Directed. 14)  Neurontin 100 Mg Caps (Gabapentin) .... Three Times A Day 15)  Levaquin 500 Mg Tabs (Levofloxacin) .Marland Kitchen.. 1 By Mouth Once Daily 16)  Prednisone 10 Mg Tabs (Prednisone) .... 3 By Mouth Once Daily For 3 Days Then 2 By Mouth Once Daily For 3 Days Then 1 By Mouth Once Daily For 3 Days  Allergies: 1)  ! Pcn 2)  ! Talwin 3)  ! Codeine 4)  ! * Myocins  Past History:  Past medical, surgical, family and social histories (including risk factors) reviewed for relevance to current acute and chronic problems.  Past Medical History: Reviewed history from 01/11/2010 and no changes required. Arthritis Asthma Skin cancer, hx of Chronic Bronchitis Frequent Headaches GERD Heart Arrythmia High BP readings  Migraines  Polyps in colon Thyroid Problems  Past Surgical History: Reviewed history from 01/11/2010 and no changes required. Breast Biopsy Appendectomy Hysterectomy Tonsillectomy  Family History: Reviewed history from 01/11/2010 and no changes required. Colon  cancer Heart disease Family History of Arthritis Family History Breast cancer 1st degree relative <50 Family History Diabetes 1st degree relative Family History High cholesterol Family History Hypertension Family History Lung cancer Family History of Sudden Death Family History Osteoporosis  Social History: Reviewed history from 01/11/2010 and no changes required. Retired Married Current Smoker Alcohol use-yes Drug  use-no Regular exercise-no  Review of Systems      See HPI  Physical Exam  General:  Well-developed,well-nourished,in no acute distress; alert,appropriate and cooperative throughout examination Ears:  External ear exam shows no significant lesions or deformities.  Otoscopic examination reveals clear canals, tympanic membranes are intact bilaterally without bulging, retraction, inflammation or discharge. Hearing is grossly normal bilaterally. Nose:  L frontal sinus tenderness, L maxillary sinus tenderness, R frontal sinus tenderness, and R maxillary sinus tenderness.   Mouth:  Oral mucosa and oropharynx without lesions or exudates.  Teeth in good repair. Neck:  No deformities, masses, or tenderness noted. Lungs:  Normal respiratory effort, chest expands symmetrically. Lungs are clear to auscultation, no crackles or wheezes. Heart:  Normal rate and regular rhythm. S1 and S2 normal without gallop, murmur, click, rub or other extra sounds. Skin:  Intact without suspicious lesions or rashes Cervical Nodes:  No lymphadenopathy noted Psych:  Cognition and judgment appear intact. Alert and cooperative with normal attention span and concentration. No apparent delusions, illusions, hallucinations   Impression & Recommendations:  Problem # 1:  SINUSITIS (ICD-473.9)  Her updated medication list for this problem includes:    Nasonex 50 Mcg/act Susp (Mometasone furoate) .Marland Kitchen... 1-2 sprays    Levaquin 500 Mg Tabs (Levofloxacin) .Marland Kitchen... 1 by mouth once daily  Orders: Admin of Therapeutic Inj  intramuscular or subcutaneous (04540) Depo- Medrol 80mg  (J1040)  Take antibiotics for full duration. Discussed treatment options including indications for coronal CT scan of sinuses and ENT referral.   Problem # 2:  ACUTE BRONCHITIS (ICD-466.0)  Her updated medication list for this problem includes:    Proair Hfa 108 (90 Base) Mcg/act Aers (Albuterol sulfate)    Levaquin 500 Mg Tabs (Levofloxacin) .Marland Kitchen... 1 by  mouth once daily  Take antibiotics and other medications as directed. Encouraged to push clear liquids, get enough rest, and take acetaminophen as needed. To be seen in 5-7 days if no improvement, sooner if worse.  Complete Medication List: 1)  Hydrochlorothiazide 25 Mg Tabs (Hydrochlorothiazide) .Marland Kitchen.. 1 by mouth daily. 2)  Atenolol 50 Mg Tabs (Atenolol) .... 1/2 tab two times a day 3)  Premarin 0.9 Mg Tabs (Estrogens conjugated) .Marland Kitchen.. 1 tab once daily 4)  Cymbalta 60 Mg Cpep (Duloxetine hcl) .Marland Kitchen.. 1 tab once daily 5)  Trazodone Hcl 100 Mg Tabs (Trazodone hcl) .Marland Kitchen.. 1 tab hs. 6)  Tramadol Hcl 50 Mg Tabs (Tramadol hcl) .Marland Kitchen.. 1 or 2 tabs two times a day 7)  Methocarbamol 500 Mg Tabs (Methocarbamol) .Marland Kitchen.. 1 or 2 once daily 8)  Dexilant 60 Mg Cpdr (Dexlansoprazole) .Marland Kitchen.. 1 by mouth once daily 9)  Nasonex 50 Mcg/act Susp (Mometasone furoate) .Marland Kitchen.. 1-2 sprays 10)  Clarinex 5 Mg Tabs (Desloratadine) .Marland Kitchen.. 1 by mouth once daily 11)  Hydrocodone-acetaminophen 5-500 Mg Tabs (Hydrocodone-acetaminophen) .Marland Kitchen.. 1-2 tabs q 4-6 hrs 12)  Proair Hfa 108 (90 Base) Mcg/act Aers (Albuterol sulfate) 13)  Hydrocortisone Ace-pramoxine 2.5-1 % Crea (Hydrocortisone ace-pramoxine) .... Use as directed. 14)  Neurontin 100 Mg Caps (Gabapentin) .... Three times a day 15)  Levaquin 500 Mg Tabs (Levofloxacin) .Marland Kitchen.. 1 by mouth once daily 16)  Prednisone 10  Mg Tabs (Prednisone) .... 3 by mouth once daily for 3 days then 2 by mouth once daily for 3 days then 1 by mouth once daily for 3 days Prescriptions: DEXILANT 60 MG CPDR (DEXLANSOPRAZOLE) 1 by mouth once daily  #30 x 11   Entered and Authorized by:   Loreen Freud DO   Signed by:   Loreen Freud DO on 03/10/2010   Method used:   Electronically to        [facility] [phone]* (retail)       [address]       [place], [place]  [address]       Ph: [phone]       Fax: [phone]   RxID:   [id number] PREDNISONE 10 MG TABS (PREDNISONE) 3 by  mouth once daily for 3 days then 2 by mouth once daily for 3 days then 1 by mouth once daily for 3 days  #18 x 0   Entered and Authorized by:   Loreen Freud DO   Signed by:   Loreen Freud DO on 03/10/2010   Method used:   Electronically to        [facility] [phone]* (retail)       [address]       [place], [place]  [address]       Ph: [phone]       Fax: [phone]   RxID:   [id number] LEVAQUIN 500 MG TABS (LEVOFLOXACIN) 1 by mouth once daily  #10 x 0   Entered and Authorized by:   Loreen Freud DO   Signed by:   Loreen Freud DO on 03/10/2010   Method used:   Electronically to        [facility] [phone]* (retail)       [address]       [place], [place]  [address]       Ph: [phone]       Fax: [phone]   RxID:   [id number]    Medication Administration  Injection # 1:    Medication: Depo- Medrol 80mg     Diagnosis: SINUSITIS (ICD-473.9)    Route: IM    Site: RUOQ gluteus    Exp Date: 10/2010    Lot #: [id number]    Mfr: [email or web address]    Patient tolerated injection without complications    Given by: Army Fossa CMA (March 10, 2010 11:54 AM)  Orders Added: 1)  Admin of Therapeutic Inj  intramuscular or subcutaneous [96372] 2)  Depo- Medrol 80mg  [J1040] 3)  Est. Patient Level III [29937]

## 2011-01-24 NOTE — Assessment & Plan Note (Signed)
Summary: eph   Visit Type:  Post-hospital Primary Sholom Dulude:  Laury Axon  CC:  Chest  tightness- Sob- Chest pains.  History of Present Illness: 65 yo WF with history of asthma, GERD, HTN and hypothryoidism who was admitted to [facility] August 05, 2010 with exertional chest pain and dyspnea. Her EKG showed inferolateral ST segment depression. She was admitted and underwent cardiac cath which showed mild non-obstructive disease. Her LVEF was normal. D-dimer was normal. Cardiac enzymes were negative.   Since she has been home, she has had occasional tightness in her chest. Usually occur when she is walking up the stairs. No associated SOB or nausea. Occasional radiation down her right arm and into her back. She has not been taking her PPI on a daily basis. She is very anxious today. She feels that she was told in the hospital that her heart muscle is weak but our records of her cath do not coincide with this. I reviewed her LV gram on the cath system again today and LV function is normal. She became tearful during our interview and stated that she did not know which physiciians to trust. She is concerned that her heart rate should not exceed 70 with exercise.    Current Medications (verified): 1)  Hydrochlorothiazide 25 Mg Tabs (Hydrochlorothiazide) .Marland Kitchen.. 1 By Mouth Daily. 2)  Atenolol 50 Mg Tabs (Atenolol) .Marland Kitchen.. 1 By Mouth Two Times A Day 3)  Premarin 0.9 Mg Tabs (Estrogens Conjugated) .Marland Kitchen.. 1 Tab Once Daily 4)  Trazodone Hcl 100 Mg Tabs (Trazodone Hcl) .Marland Kitchen.. 1 Tab Hs. 5)  Tramadol Hcl 50 Mg Tabs (Tramadol Hcl) .Marland Kitchen.. 1 or 2 Tabs Two Times A Day 6)  Methocarbamol 500 Mg Tabs (Methocarbamol) .Marland Kitchen.. 1 or 2 Once Daily 7)  Dexilant 60 Mg Cpdr (Dexlansoprazole) .Marland Kitchen.. 1 By Mouth Once Daily As Needed 8)  Nasonex 50 Mcg/act Susp (Mometasone Furoate) .Marland Kitchen.. 1-2 Sprays 9)  Clarinex 5 Mg Tabs (Desloratadine) .Marland Kitchen.. 1 By Mouth Once Daily 10)  Proair Hfa 108 (90 Base) Mcg/act Aers (Albuterol Sulfate) 11)   Hydrocortisone Ace-Pramoxine 2.5-1 % Crea (Hydrocortisone Ace-Pramoxine) .... Use As Directed. 12)  Neurontin 100 Mg Caps (Gabapentin) .... Three Times A Day 13)  Cymbalta 30 Mg Cpep (Duloxetine Hcl) .... 3 By Mouth Once Daily 14)  Valium 2 Mg Tabs (Diazepam) .... One Tablet As Needed 15)  Nitrostat 0.4 Mg Subl (Nitroglycerin) .Marland Kitchen.. 1 Tablet Under Tongue At Onset of Chest Pain; You May Repeat Every 5 Minutes For Up To 3 Doses.  Allergies: 1)  ! Pcn 2)  ! Talwin 3)  ! Codeine 4)  ! * Myocins  Past History:  Past Medical History: Arthritis Asthma Skin cancer, hx of Chronic Bronchitis Frequent Headaches GERD Heart Arrythmia-Irregular heartbeat HTN Migraines  Polyps in colon Hypothyroidism  Past Surgical History: Breast Biopsy Appendectomy Hysterectomy Tonsillectomy Skin cancer removal Vocal cord cyst removal  Family History: Colon cancer Heart disease Family History of Arthritis Family History Breast cancer 1st degree relative <50 Family History Diabetes 1st degree relative Family History High cholesterol Family History Hypertension Family History Lung cancer Family History of Sudden Death Family History Osteoporosis  Father-deceased, CHF/CAD age 57 Mother-alive, healthy 1 sister-alive and healthy  Social History: Retired-nurse Married Former tobacco abuse-stopped August 2011 (Total of 15 years, 1ppd) Alcohol use-yes Drug use-no Regular exercise-no  Review of Systems       The patient complains of chest pain and anxiety.  The patient denies malaise, fever, weight gain/loss, vision loss, decreased hearing, hoarseness,  palpitations, shortness of breath, prolonged cough, wheezing, sleep apnea, coughing up blood, abdominal pain, blood in stool, nausea, vomiting, diarrhea, heartburn, incontinence, blood in urine, muscle weakness, joint pain, leg swelling, rash, skin lesions, headache, fainting, dizziness, depression, enlarged lymph nodes, easy bruising or bleeding,  and environmental allergies.    Vital Signs:  Patient profile:   65 year old female Height:      65 inches Weight:      107.50 pounds BMI:     17.95 Pulse rate:   63 / minute Pulse rhythm:   regular Resp:     18 per minute BP sitting:   124 / 74  (left arm) Cuff size:   large  Vitals Entered By: Vikki Ports (August 31, 2010 2:28 PM)  Physical Exam  General:  General: Well developed, well nourished, NAD HEENT: OP clear, mucus membranes moist SKIN: warm, dry Neuro: No focal deficits Musculoskeletal: Muscle strength 5/5 all ext Psychiatric: Mood and affect normal Neck: No JVD, no carotid bruits, no thyromegaly, no lymphadenopathy. Lungs:Clear bilaterally, no wheezes, rhonci, crackles CV: RRR no murmurs, gallops rubs Abdomen: soft, NT, ND, BS present Extremities: No edema, pulses 2+.    Cardiac Cath  Procedure date:  08/05/2010  Findings:      HEMODYNAMIC FINDINGS:  Central aortic pressure 135/58.  Left ventricular pressure 150/0.  Left ventricular end-diastolic pressure 7.   ANGIOGRAPHIC FINDINGS: 1. The left main coronary artery had no evidence of disease. 2. The left anterior descending was a large vessel that coursed to the     apex and gave off two diagonal branches.  The first diagonal branch     was a very small caliber and had no disease.  Second diagonal was     moderate sized and had no disease.  The proximal LAD had a tubular     40% stenosis.  The ostium of the vessel had a 30% stenosis.  The     mid vessel had a 30% stenosis.  None of these lesions appeared to     be flow limiting. 3. The circumflex artery gave off an early ramus intermediate branch     that is free of disease.  The first obtuse marginal branch had no     disease. 4. The right coronary artery was a moderate-sized dominant vessel with     30% stenosis in the proximal portion of the vessel and mild plaque     in the mid vessel. 5. Left ventricular angiogram was performed in the RAO  projection and     showed normal left ventricular systolic function with ejection     fraction of 60%.  The aortic root was normal sized with no evidence     of enlargement or dissection.  There was no mitral regurgitation     noted.   IMPRESSION: 1. Mild nonobstructive coronary artery disease. 2. Normal left ventricular systolic function. 3. Noncardiac etiology of chest pain.  EKG  Procedure date:  08/31/2010  Findings:      NSR, rate 64 bpm. non-specific ST  and T wave changes, similar to EKG from August.   Impression & Recommendations:  Problem # 1:  CAD, NATIVE VESSEL (ICD-414.01) She had mild CAD on cath. I do not think the mild plaque disease is responsible for her chest pain. LV pressures normal. LV function normal. She is concerned that she may have hypertensive heart disease. Will check echo to exclude structural heart disease.  Continue beta blocker, statin and ASA.  Will check fasting lipids and LFTs in 10 weeks. Crestor started in hospital two weeks ago given CAD.   Her updated medication list for this problem includes:    Atenolol 50 Mg Tabs (Atenolol) .Marland Kitchen... 1 by mouth two times a day    Nitrostat 0.4 Mg Subl (Nitroglycerin) .Marland Kitchen... 1 tablet under tongue at onset of chest pain; you may repeat every 5 minutes for up to 3 doses.  Other Orders: EKG w/ Interpretation (93000) Echocardiogram (Echo)  Patient Instructions: 1)  Your physician recommends that you schedule a follow-up appointment in: 6 months 2)  Your physician recommends that you return for a FASTING lipid profile and Liver Function Test in 10 weeks.[id number] 3)  Your physician recommends that you continue on your current medications as directed. Please refer to the Current Medication list given to you today. 4)  Your physician has requested that you have an echocardiogram.  Echocardiography is a painless test that uses sound waves to create images of your heart. It provides your doctor with information  about the size and shape of your heart and how well your heart's chambers and valves are working.  This procedure takes approximately one hour. There are no restrictions for this procedure.

## 2011-01-24 NOTE — Assessment & Plan Note (Signed)
Summary: med refill/cbs   Vital Signs:  Patient profile:   65 year old female Height:      65 inches Weight:      103 pounds O2 Sat:      95 % on Room air Temp:     98.9 degrees F oral Pulse rate:   82 / minute BP sitting:   180 / 110  (left arm)  Vitals Entered By: Jeremy Johann CMA (August 03, 2010 10:15 AM)  O2 Flow:  Room air  Serial Vital Signs/Assessments:  Time      Position  BP       Pulse  Resp  Temp     By 10:47 AM            150/100                        Loreen Freud DO  CC: MED FILL, DISCUSS ANXIETY, NERVES   History of Present Illness: Pt here c/o increased anxiety.  She just got back from [place] and her husband had MI and he quadruple bypass and she is fighting with ex wife and his daughter and she has headache and SOB and feels like she is having a breakdown.   + chest tightness with exertion and radiation to L arm.     Her husband is at [facility].   Preventive Screening-Counseling & Management  Alcohol-Tobacco     Alcohol drinks/day: <1     Smoking Status: current     Smoking Cessation Counseling: yes     Packs/Day: <0.25     Year Started: 1966     Year Quit: quit several times--started 2 years ago  Caffeine-Diet-Exercise     Caffeine use/day: 0     Does Patient Exercise: no  Current Medications (verified): 1)  Hydrochlorothiazide 25 Mg Tabs (Hydrochlorothiazide) .Marland Kitchen.. 1 By Mouth Daily. 2)  Atenolol 50 Mg Tabs (Atenolol) .Marland Kitchen.. 1 By Mouth Two Times A Day 3)  Premarin 0.9 Mg Tabs (Estrogens Conjugated) .Marland Kitchen.. 1 Tab Once Daily 4)  Trazodone Hcl 100 Mg Tabs (Trazodone Hcl) .Marland Kitchen.. 1 Tab Hs. 5)  Tramadol Hcl 50 Mg Tabs (Tramadol Hcl) .Marland Kitchen.. 1 or 2 Tabs Two Times A Day 6)  Methocarbamol 500 Mg Tabs (Methocarbamol) .Marland Kitchen.. 1 or 2 Once Daily 7)  Dexilant 60 Mg Cpdr (Dexlansoprazole) .Marland Kitchen.. 1 By Mouth Once Daily As Needed 8)  Nasonex 50 Mcg/act Susp (Mometasone Furoate) .Marland Kitchen.. 1-2 Sprays 9)  Clarinex 5 Mg Tabs (Desloratadine) .Marland Kitchen.. 1 By Mouth Once Daily 10)   Proair Hfa 108 (90 Base) Mcg/act Aers (Albuterol Sulfate) 11)  Hydrocortisone Ace-Pramoxine 2.5-1 % Crea (Hydrocortisone Ace-Pramoxine) .... Use As Directed. 12)  Neurontin 100 Mg Caps (Gabapentin) .... Three Times A Day 13)  Cymbalta 30 Mg Cpep (Duloxetine Hcl) .... 3 By Mouth Once Daily 14)  Klonopin 0.5 Mg Tabs (Clonazepam) .Marland Kitchen.. 1 By Mouth Two Times A Day  Allergies (verified): 1)  ! Pcn 2)  ! Talwin 3)  ! Codeine 4)  ! * Myocins  Past History:  Family History: Last updated: 01/11/2010 Colon cancer Heart disease Family History of Arthritis Family History Breast cancer 1st degree relative <50 Family History Diabetes 1st degree relative Family History High cholesterol Family History Hypertension Family History Lung cancer Family History of Sudden Death Family History Osteoporosis  Social History: Last updated: 01/11/2010 Retired Married Current Smoker Alcohol use-yes Drug use-no Regular exercise-no  Risk Factors: Alcohol Use: <1 (08/03/2010) Caffeine Use: 0 (08/03/2010)  Exercise: no (08/03/2010)  Risk Factors: Smoking Status: current (08/03/2010) Packs/Day: <0.25 (08/03/2010)  Past medical, surgical, family and social histories (including risk factors) reviewed for relevance to current acute and chronic problems.  Past Medical History: Reviewed history from 01/11/2010 and no changes required. Arthritis Asthma Skin cancer, hx of Chronic Bronchitis Frequent Headaches GERD Heart Arrythmia High BP readings  Migraines  Polyps in colon Thyroid Problems  Past Surgical History: Reviewed history from 01/11/2010 and no changes required. Breast Biopsy Appendectomy Hysterectomy Tonsillectomy  Family History: Reviewed history from 01/11/2010 and no changes required. Colon cancer Heart disease Family History of Arthritis Family History Breast cancer 1st degree relative <50 Family History Diabetes 1st degree relative Family History High cholesterol Family  History Hypertension Family History Lung cancer Family History of Sudden Death Family History Osteoporosis  Social History: Reviewed history from 01/11/2010 and no changes required. Retired Married Current Smoker Alcohol use-yes Drug use-no Regular exercise-no  Review of Systems      See HPI  Physical Exam  General:  Well-developed,well-nourished,in no acute distress; alert,appropriate and cooperative throughout examination Neck:  No deformities, masses, or tenderness noted. Lungs:  Normal respiratory effort, chest expands symmetrically. Lungs are clear to auscultation, no crackles or wheezes. Heart:  normal rate and no murmur.   Abdomen:  Bowel sounds positive,abdomen soft and non-tender without masses, organomegaly or hernias noted. Extremities:  No clubbing, cyanosis, edema, or deformity noted with normal full range of motion of all joints.   Skin:  Intact without suspicious lesions or rashes Psych:  Pt crying uncontrollably in room at first--- we were able to calm her down and gave her some water.  Samara Deist Troxler--counselor---did talk to her for a few minutes as well   Impression & Recommendations:  Problem # 1:  CHEST PAIN UNSPECIFIED (ICD-786.50) 911 called to take pt to hospital to be evaluated Orders: Venipuncture (08657) TLB-Cardiac Panel ([id number]) TLB-BMP (Basic Metabolic Panel-BMET) (80048-METABOL) TLB-CBC Platelet - w/Differential (85025-CBCD) EKG w/ Interpretation (93000)  Problem # 2:  DYSPNEA ON EXERTION (ICD-786.09)  Orders: Venipuncture (44010) TLB-Cardiac Panel ([id number]) TLB-BMP (Basic Metabolic Panel-BMET) (80048-METABOL) TLB-CBC Platelet - w/Differential (85025-CBCD) EKG w/ Interpretation (93000)  Her updated medication list for this problem includes:    Hydrochlorothiazide 25 Mg Tabs (Hydrochlorothiazide) .Marland Kitchen... 1 by mouth daily.    Atenolol 50 Mg Tabs (Atenolol) .Marland Kitchen... 1 by mouth two times a day    Proair Hfa 108 (90 Base)  Mcg/act Aers (Albuterol sulfate)  Problem # 3:  GENERALIZED ANXIETY DISORDER (ICD-300.02)  The following medications were removed from the medication list:    Cymbalta 60 Mg Cpep (Duloxetine hcl) .Marland Kitchen... 1 tab once daily Her updated medication list for this problem includes:    Trazodone Hcl 100 Mg Tabs (Trazodone hcl) .Marland Kitchen... 1 tab hs.    Cymbalta 30 Mg Cpep (Duloxetine hcl) .Marland KitchenMarland KitchenMarland KitchenMarland Kitchen 3 by mouth once daily    Klonopin 0.5 Mg Tabs (Clonazepam) .Marland Kitchen... 1 by mouth two times a day  Orders: Venipuncture (25956) TLB-Cardiac Panel ([id number]) TLB-BMP (Basic Metabolic Panel-BMET) (80048-METABOL) TLB-CBC Platelet - w/Differential (85025-CBCD) EKG w/ Interpretation (93000)  Discussed medication use and relaxation techniques.   Complete Medication List: 1)  Hydrochlorothiazide 25 Mg Tabs (Hydrochlorothiazide) .Marland Kitchen.. 1 by mouth daily. 2)  Atenolol 50 Mg Tabs (Atenolol) .Marland Kitchen.. 1 by mouth two times a day 3)  Premarin 0.9 Mg Tabs (Estrogens conjugated) .Marland Kitchen.. 1 tab once daily 4)  Trazodone Hcl 100 Mg Tabs (Trazodone hcl) .Marland Kitchen.. 1 tab hs. 5)  Tramadol Hcl 50 Mg Tabs (Tramadol  hcl) .... 1 or 2 tabs two times a day 6)  Methocarbamol 500 Mg Tabs (Methocarbamol) .Marland Kitchen.. 1 or 2 once daily 7)  Dexilant 60 Mg Cpdr (Dexlansoprazole) .Marland Kitchen.. 1 by mouth once daily as needed 8)  Nasonex 50 Mcg/act Susp (Mometasone furoate) .Marland Kitchen.. 1-2 sprays 9)  Clarinex 5 Mg Tabs (Desloratadine) .Marland Kitchen.. 1 by mouth once daily 10)  Proair Hfa 108 (90 Base) Mcg/act Aers (Albuterol sulfate) 11)  Hydrocortisone Ace-pramoxine 2.5-1 % Crea (Hydrocortisone ace-pramoxine) .... Use as directed. 12)  Neurontin 100 Mg Caps (Gabapentin) .... Three times a day 13)  Cymbalta 30 Mg Cpep (Duloxetine hcl) .... 3 by mouth once daily 14)  Klonopin 0.5 Mg Tabs (Clonazepam) .Marland Kitchen.. 1 by mouth two times a day Prescriptions: KLONOPIN 0.5 MG TABS (CLONAZEPAM) 1 by mouth two times a day  #60 x 1   Entered and Authorized by:   Loreen Freud DO   Signed by:   Loreen Freud  DO on 08/03/2010   Method used:   Print then Give to Patient   RxID:   [id number] ATENOLOL 50 MG TABS (ATENOLOL) 1 by mouth two times a day  #60 x 2   Entered and Authorized by:   Loreen Freud DO   Signed by:   Loreen Freud DO on 08/03/2010   Method used:   [facility] to        [facility] [phone]* (retail)       [address]       [place], [place]  [address]       Ph: [phone] or [phone]       Fax: [phone]   RxID:   [id number] CYMBALTA 30 MG CPEP (DULOXETINE HCL) 3 by mouth once daily  #90 x 3   Entered and Authorized by:   Loreen Freud DO   Signed by:   Loreen Freud DO on 08/03/2010   Method used:   [facility] to        [facility] [phone]* (retail)       [address]       [place], [place]  [address]       Ph: [phone] or [phone]       Fax: [phone]   RxID:   [id number]    EKG  Procedure date:  08/03/2010  Findings:      sinus 83 bpm inf/ lat st changes

## 2011-01-26 NOTE — Letter (Signed)
Summary: Appt Reminder 2  [facility]  [address] [place], [place] [address]   Phone: [phone]  Fax: [phone]        January 02, 2011 MRN: [id number]    [name] [address] [place], [place]  [address]    Dear [name],   You have a return appointment with Dr. Hennie Duos on 01/30/11 at 8:45am.  Please remember to bring a complete list of the medicines you are taking, your insurance card and your co-pay.  If you have to cancel or reschedule this appointment, please call before 5:00 pm the evening before to avoid a cancellation fee.  If you have any questions or concerns, please call [phone].    Sincerely,    Selinda Michaels RN  Appended Document: Appt Reminder 2 Letter is mailed to the patient's home address

## 2011-01-26 NOTE — Procedures (Signed)
Summary: Instructions for procedure/[place]  Instructions for procedure/[place]   Imported By: Sherian Rein 01/03/2011 12:11:49  _____________________________________________________________________  External Attachment:    Type:   Image     Comment:   External Document

## 2011-01-26 NOTE — Procedures (Addendum)
Summary: Upper Endoscopy  Patient: [name] Note: All result statuses are Final unless otherwise noted.  Tests: (1) Upper Endoscopy (EGD)   EGD Upper Endoscopy       DONE     [facility]     [address] [place], [place]  [address]           ENDOSCOPY PROCEDURE REPORT           PATIENT:  [name], [name]  MR#:  [id number]     BIRTHDATE:  06/30/1946, 64 yrs. old  GENDER:  female           ENDOSCOPIST:  Barbette Hair. Arlyce Dice, MD     Referred by:           PROCEDURE DATE:  12/30/2010     PROCEDURE:  EGD with dilatation over guidewire     ASA CLASS:  Class II     INDICATIONS:  dysphagia           MEDICATIONS:   Fentanyl 50 mcg, Versed 7 mg IV, glycopyrrolate     (Robinal) 0.2 mg IV     TOPICAL ANESTHETIC:  Exactacain Spray           DESCRIPTION OF PROCEDURE:   After the risks benefits and     alternatives of the procedure were thoroughly explained, informed     consent was obtained.  The Pentax Gastroscope [id number] endoscope     was introduced through the mouth and advanced to the third portion     of the duodenum, without limitations.  The instrument was slowly     withdrawn as the mucosa was fully examined.           The esophagus and gastroesophageal junction were completely normal     in appearance. Persistent dysphagia. Recent swallowing study     showed hangup of pill in midesophagus savary / guidewire 16 and     18mm dilators were passed under floro. Mild resistance; no heme     Otherwise the examination was normal.    Retroflexed views revealed     no abnormalities.    The scope was then withdrawn from the patient     and the procedure completed.           COMPLICATIONS:  None           ENDOSCOPIC IMPRESSION:     1) Probable esophageal stricture (vs motility disorder) - s/p     savory dilitation     2) Otherwise normal examination     RECOMMENDATIONS:     1) Call office next 2-3 days to schedule an office appointment     for 2-3 weeks     2) begin  lactulose for constipation           REPEAT EXAM:  No           ______________________________     Barbette Hair. Arlyce Dice, MD           CC:  Lelon Perla, DO           n.     eSIGNED:   Barbette Hair. Rossanna Spitzley at 12/30/2010 01:00 PM           Harly, [place], [id number]  Note: An exclamation mark (!) indicates a result that was not dispersed into the flowsheet. Document Creation Date: 12/30/2010 1:01 PM _______________________________________________________________________  (1) Order result status: Final Collection or observation date-time: 12/30/2010 12:55 Requested date-time:  Receipt date-time:  Reported date-time:  Referring Physician:   Ordering Physician: Melvia Heaps [phone]) Specimen Source:  Source: Launa Grill Order Number: [id number] Lab site:

## 2011-01-26 NOTE — Assessment & Plan Note (Signed)
Summary: F/U FROM BARIUM SWALLOW/RS   History of Present Illness Primary GI MD: Melvia Heaps MD [facility] Primary Provider: Loreen Freud, DO Requesting Provider: n/a Chief Complaint: f/u Barium Swallow, Pt still feeling choked and problems with acid reflux.  History of Present Illness:   [name] has returned for followup of her dysphagia and regurgitation of gastric contents.  Barium  swallow was revealing only for slight hold up of a barium tablet in the midesophagus.  Exam is limited since the patient could only take small sips of liquid.  Her primary complaint is regurgitation of gastric contents.  She denies dysphagia, per se.  With Zegerid symptoms significantly improved .   She often awakens in the morning with a sore throat and complains of pyrosis.  The patient  also suffers from chronic constipation.  She may go 1-2 weeks without a bowel movement and may develop distention and mild discomfort.  For years she hadsuffered from a diarrhea.  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   GI Review of Systems    Reports acid reflux, dysphagia with liquids, and  dysphagia with solids.      Denies abdominal pain, belching, bloating, chest pain, heartburn, loss of appetite, nausea, vomiting, vomiting blood, weight loss, and  weight gain.        Denies anal fissure, black tarry stools, change in bowel habit, constipation, diarrhea, diverticulosis, fecal incontinence, heme positive stool, hemorrhoids, irritable bowel syndrome, jaundice, light color stool, liver problems, rectal bleeding, and  rectal pain.    Current Medications (verified): 1)  Hydrochlorothiazide 25 Mg Tabs (Hydrochlorothiazide) .Marland Kitchen.. 1 By Mouth Daily. 2)  Atenolol 50 Mg Tabs (Atenolol) .... 1/2 Tablet By Mouth Two Times A Day 3)  Premarin 0.9 Mg Tabs (Estrogens Conjugated) .Marland Kitchen.. 1 Tab Once Daily 4)  Trazodone Hcl 100 Mg Tabs (Trazodone Hcl) .Marland Kitchen.. 1 Tab Hs. 5)  Tramadol Hcl 50 Mg Tabs (Tramadol Hcl) .Marland Kitchen.. 1 or 2 Tabs  Two Times A Day 6)  Nasonex 50 Mcg/act Susp (Mometasone Furoate) .Marland Kitchen.. 1-2 Sprays 7)  Clarinex 5 Mg Tabs (Desloratadine) .Marland Kitchen.. 1 By Mouth Once Daily 8)  Proair Hfa 108 (90 Base) Mcg/act Aers (Albuterol Sulfate) 9)  Hydrocortisone Ace-Pramoxine 2.5-1 % Crea (Hydrocortisone Ace-Pramoxine) .... Use As Directed. 10)  Neurontin 100 Mg Caps (Gabapentin) .... Three Times A Day 11)  Cymbalta 30 Mg Cpep (Duloxetine Hcl) .... Two By Mouth Once Daily 12)  Nitrostat 0.4 Mg Subl (Nitroglycerin) .Marland Kitchen.. 1 Tablet Under Tongue At Onset of Chest Pain; You May Repeat Every 5 Minutes For Up To 3 Doses. 13)  Aspirin 81 Mg Tbec (Aspirin) .... By Mouth Once Daily 14)  Crestor 10 Mg Tabs (Rosuvastatin Calcium) .Marland Kitchen.. 1 By Mouth At Bedtime 15)  Zithromax Z-Pak 250 Mg Tabs (Azithromycin) .... By Mouth As Directed  Allergies (verified): 1)  ! Pcn 2)  ! Talwin 3)  ! Codeine 4)  ! * Myocins  Past History:  Past Medical History: Reviewed history from 08/31/2010 and no changes required. Arthritis Asthma Skin cancer, hx of Chronic Bronchitis Frequent Headaches GERD Heart Arrythmia-Irregular heartbeat HTN Migraines  Polyps in colon Hypothyroidism  Past Surgical History: Reviewed history from 09/13/2010 and no changes required. Breast Biopsy Appendectomy Hysterectomy Tonsillectomy Skin cancer removal Vocal cord cyst removal Sinus Surgery 1980's Repair of right ankle 1996  Family History: Reviewed history from 09/19/2010 and no changes required. Rectal Cancer: Mother  Heart disease Family History of Arthritis Family History Breast cancer 1st degree relative <50 Family History Diabetes 1st  degree relative Family History High cholesterol Family History Hypertension Family History Lung cancer Family History of Sudden Death Family History Osteoporosis Father-deceased, CHF/CAD age 29 Mother-alive, healthy 1 sister-alive and healthy Family History of Irritable Bowel Syndrome: All her 3  childern  Social History: Reviewed history from 09/19/2010 and no changes required. Retired-nurse Married Childern Former tobacco abuse-stopped August 2011 (Total of 15 years, 1ppd) Alcohol use-yes Drug use-no Regular exercise-no Daily Caffeine Use: coffee  Review of Systems  The patient denies allergy/sinus, anemia, anxiety-new, arthritis/joint pain, back pain, blood in urine, breast changes/lumps, change in vision, confusion, cough, coughing up blood, depression-new, fainting, fatigue, fever, headaches-new, hearing problems, heart murmur, heart rhythm changes, itching, menstrual pain, muscle pains/cramps, night sweats, nosebleeds, pregnancy symptoms, shortness of breath, skin rash, sleeping problems, sore throat, swelling of feet/legs, swollen lymph glands, thirst - excessive , urination - excessive , urination changes/pain, urine leakage, vision changes, and voice change.    Vital Signs:  Patient profile:   65 year old female Height:      65 inches Weight:      112 pounds BMI:     18.71 Pulse rate:   100 / minute Pulse rhythm:   regular BP sitting:   148 / 72  (right arm) Cuff size:   regular  Vitals Entered By: Christie Nottingham CMA Duncan Dull) (December 01, 2010 11:09 AM)   Impression & Recommendations:  Problem # 1:  GERD (ICD-530.81) Symptoms are well-controlled with Zegerid  Recommendations #1 continue Zegerid 40 mg q.h.s.  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  Recommendations #1 patient will consider enrollment in IBS/constipation trial.  Failing that I will try her on MiraLax and fiber  Problem # 3:  COLORECTAL CANCER, FAMILY HX (ICD-V16.0) Plan colonoscopy every 5 years  Problem # 4:  GENERALIZED ANXIETY DISORDER (ICD-300.02) Assessment:  Comment Only  Patient Instructions: 1)  Copy sent to : Loreen Freud, DO 2)  The medication list was reviewed and reconciled.  All changed / newly prescribed medications were explained.  A complete medication list was provided to the patient / caregiver. Prescriptions: ZEGERID 40-1100 MG CAPS (OMEPRAZOLE-SODIUM BICARBONATE) 1 by mouth at bedtime  #30 x 3   Entered by:   Merri Ray CMA (AAMA)   Authorized by:   Louis Meckel MD   Signed by:   Merri Ray CMA (AAMA) on 12/01/2010   Method used:   [facility]        [facility] [phone]* (retail)       [address]       [place], [place]  [address]       Ph: [phone] or [phone]       Fax: [phone]   RxID:   [id number]

## 2011-01-26 NOTE — Miscellaneous (Signed)
  Clinical Lists Changes  Medications: Added new medication of KRISTALOSE 10 GM PACK (LACTULOSE) take 15cc two times a day - Signed Rx of KRISTALOSE 10 GM PACK (LACTULOSE) take 15cc two times a day;  #1 month supp x 2;  Signed;  Entered by: Louis Meckel MD;  Authorized by: Louis Meckel MD;  Method used: [facility] [address] [phone]*, [address] [place], [place], [place]  [address], Ph: [phone] or [phone], Fax: [phone]    Prescriptions: KRISTALOSE 10 GM PACK (LACTULOSE) take 15cc two times a day  #1 month supp x 2   Entered and Authorized by:   Louis Meckel MD   Signed by:   Louis Meckel MD on 12/30/2010   Method used:   [facility]ronically to        [facility] [phone]* (retail)       [address]       [place], [place]  [address]       Ph: [phone] or [phone]       Fax: [phone]   RxID:   [id number]

## 2011-01-26 NOTE — Progress Notes (Signed)
Summary: clarify med  Phone Note Refill Request Call back at Home Phone [phone]   Refills Requested: Medication #1:  CYMBALTA 30 MG CPEP two by mouth once daily Pt states that she has been taking 3 tabs daily of the 30mg  but it is not helping. Pt would like to go back to previous dose per PT of 2 tabs daily of 60mg . Pt notes that when she took this dose it help her. Pls advise...............Marland KitchenFelecia Deloach CMA  January 17, 2011 11:45 AM     Follow-up for Phone Call        cymbalta 60 mg 2 by mouth once daily #60  5 refills Follow-up by: Loreen Freud DO,  January 17, 2011 11:56 AM  Additional Follow-up for Phone Call Additional follow up Details #1::        Left Pt detail message of med change, Rx sent to pharmacy. Pt to call with any further concerns.......Marland KitchenFelecia Deloach CMA  January 17, 2011 12:12 PM     New/Updated Medications: CYMBALTA 60 MG CPEP (DULOXETINE HCL) Take 2 by mouth once daily Prescriptions: CYMBALTA 60 MG CPEP (DULOXETINE HCL) Take 2 by mouth once daily  #60 x 5   Entered by:   Jeremy Johann CMA   Authorized by:   Loreen Freud DO   Signed by:   Jeremy Johann CMA on 01/17/2011   Method used:   [facility]ed to ...       [facility] [address] [phone]* (retail)       [address] [place], [place]  [address]       Ph: [phone] or [phone]       Fax: [phone]   RxID:   [id number]

## 2011-01-26 NOTE — Assessment & Plan Note (Signed)
  Nurse Visit   Preventive Screening-Counseling & Management  Comments: On 12/01/2010 patient was given a consent form to review for the [employer] constipation study. Patient wanted to wait until after the holidays to begin the study. After reviewing her medical history the patient does not qualify for the study due to exclusion criteria number 22 stating that a patient that has a family history of adenomatous polyposis can not enter the study. If this criteria is amended in the protocol I will contact this patient. I will also contact her for other studies that don't have this criteria.   Allergies: 1)  ! Pcn 2)  ! Talwin 3)  ! Codeine 4)  ! * Myocins

## 2011-01-26 NOTE — Assessment & Plan Note (Signed)
Summary: 6+ WEEK FU/JMS   History of Present Illness Visit Type: Follow-up Visit Primary GI MD: Melvia Heaps MD [facility] Primary Provider: Loreen Freud, DO Requesting Provider: n/a Chief Complaint: GERD follow-up and c/o heartburn History of Present Illness:   [name] has returned for followup of her dysphagia, pyrosis and constipation.  She has had 2 episodes of transient food impaction.  Barium swallow, which which was limited because of her ability to only small sips, demonstrated hangup of the barium pill in the midesophagus. She underwent Maloney dilatation of her esophagus about 4 months ago.  She has had several episodes of pyrosis in the morning followed by mild chest discomfort.  She takes Zegerid q.h.s.  Regurgitation of gastric contents is much better.  Constipation remains a problem.   GI Review of Systems    Reports acid reflux, bloating, and  heartburn.      Denies abdominal pain, belching, chest pain, dysphagia with liquids, dysphagia with solids, loss of appetite, nausea, vomiting, vomiting blood, weight loss, and  weight gain.      Reports change in bowel habits and  constipation.     Denies anal fissure, black tarry stools, diarrhea, diverticulosis, fecal incontinence, heme positive stool, hemorrhoids, irritable bowel syndrome, jaundice, light color stool, liver problems, rectal bleeding, and  rectal pain.    Current Medications (verified): 1)  Hydrochlorothiazide 25 Mg Tabs (Hydrochlorothiazide) .Marland Kitchen.. 1 By Mouth Daily. 2)  Atenolol 50 Mg Tabs (Atenolol) .... 1/2 Tablet By Mouth Two Times A Day 3)  Premarin 0.9 Mg Tabs (Estrogens Conjugated) .Marland Kitchen.. 1 Tab Once Daily 4)  Trazodone Hcl 100 Mg Tabs (Trazodone Hcl) .Marland Kitchen.. 1 Tab Hs. 5)  Tramadol Hcl 50 Mg Tabs (Tramadol Hcl) .Marland Kitchen.. 1 or 2 Tabs Two Times A Day As Needed 6)  Nasonex 50 Mcg/act Susp (Mometasone Furoate) .Marland Kitchen.. 1-2 Sprays in Each Nostril As Needed 7)  Clarinex 5 Mg Tabs (Desloratadine) .Marland Kitchen.. 1 By Mouth Once Daily As  Needed 8)  Proair Hfa 108 (90 Base) Mcg/act Aers (Albuterol Sulfate) 9)  Hydrocortisone Ace-Pramoxine 2.5-1 % Crea (Hydrocortisone Ace-Pramoxine) .... Use As Directed. 10)  Neurontin 100 Mg Caps (Gabapentin) .... Three Times A Day 11)  Cymbalta 30 Mg Cpep (Duloxetine Hcl) .... Two By Mouth Once Daily 12)  Nitrostat 0.4 Mg Subl (Nitroglycerin) .Marland Kitchen.. 1 Tablet Under Tongue At Onset of Chest Pain; You May Repeat Every 5 Minutes For Up To 3 Doses. 13)  Aspirin 81 Mg Tbec (Aspirin) .... By Mouth Once Daily 14)  Crestor 10 Mg Tabs (Rosuvastatin Calcium) .Marland Kitchen.. 1 By Mouth At Bedtime 15)  Zegerid 40-1100 Mg Caps (Omeprazole-Sodium Bicarbonate) .Marland Kitchen.. 1 By Mouth At Bedtime  Allergies (verified): 1)  ! Pcn 2)  ! Talwin 3)  ! Codeine 4)  ! * Myocins  Past History:  Past Medical History: Reviewed history from 08/31/2010 and no changes required. Arthritis Asthma Skin cancer, hx of Chronic Bronchitis Frequent Headaches GERD Heart Arrythmia-Irregular heartbeat HTN Migraines  Polyps in colon Hypothyroidism  Past Surgical History: Reviewed history from 09/13/2010 and no changes required. Breast Biopsy Appendectomy Hysterectomy Tonsillectomy Skin cancer removal Vocal cord cyst removal Sinus Surgery 1980's Repair of right ankle 1996  Family History: Reviewed history from 09/19/2010 and no changes required. Rectal Cancer: Mother  Heart disease Family History of Arthritis Family History Breast cancer 1st degree relative <50 Family History Diabetes 1st degree relative Family History High cholesterol Family History Hypertension Family History Lung cancer Family History of Sudden Death Family History Osteoporosis Father-deceased, CHF/CAD age  65 Mother-alive, healthy 1 sister-alive and healthy Family History of Irritable Bowel Syndrome: All her 3 childern  Social History: Reviewed history from 09/19/2010 and no changes required. Retired-nurse Married Childern Former tobacco  abuse-stopped August 2011 (Total of 15 years, 1ppd) Alcohol use-yes Drug use-no Regular exercise-no Daily Caffeine Use: coffee  Review of Systems  The patient denies allergy/sinus, anemia, anxiety-new, arthritis/joint pain, back pain, blood in urine, breast changes/lumps, change in vision, confusion, cough, coughing up blood, depression-new, fainting, fatigue, fever, headaches-new, hearing problems, heart murmur, heart rhythm changes, itching, menstrual pain, muscle pains/cramps, night sweats, nosebleeds, pregnancy symptoms, shortness of breath, skin rash, sleeping problems, sore throat, swelling of feet/legs, swollen lymph glands, thirst - excessive , urination - excessive , urination changes/pain, urine leakage, vision changes, and voice change.    Vital Signs:  Patient profile:   65 year old female Height:      65 inches Weight:      134 pounds BMI:     22.38 Pulse rate:   68 / minute Pulse rhythm:   regular BP sitting:   128 / 78  (left arm)  Vitals Entered By: Milford Cage NCMA (December 29, 2010 10:46 AM)   Impression & Recommendations:  Problem # 1:  DYSPHAGIA UNSPECIFIED (ICD-787.20)  This is a persistent problem despite dilation several months ago.  Barium swallow raises the question of an esophageal stricture or possibly a motility disorder.  Recommendations #1 repeat upper endoscopy with Savary dilatation  Orders: ZENDO with Sav. Dil (ZENDO/Sav Dil.)  Problem # 2:  GERD (ICD-530.81) Regurgitation is significantly improved.  Breakthrough pyrosis in the morning certainly could represent acid reflux.  It is also possible that she has experienced pain from pills causing a mild pill esophagitis.  Recommendations #1 patient was instructed to take a second dose of Zegerid as necessary in the mornings #2 repeat dilatation of her esophagus  Problem # 3:  CONSTIPATION (ICD-564.00) Patient is considering enrollment in a constipation study  Patient Instructions: 1)  Copy  sent to : Loreen Freud, DO 2)  Your EGD is scheduled at [facility] on 12/30/2010 at 12:30pm 3)  Upper Endoscopy brochure given.  4)  Upper Endoscopy with Dilatation brochure given.  5)  The medication list was reviewed and reconciled.  All changed / newly prescribed medications were explained.  A complete medication list was provided to the patient / caregiver.

## 2011-01-26 NOTE — Assessment & Plan Note (Signed)
Summary: STILL SICK, WANTS O2 SATS CHECKED, ANOTHER ANTI--NEXT LINE   Vital Signs:  Patient profile:   65 year old female Weight:      112.8 pounds O2 Sat:      98 % on Room air Temp:     98.1 degrees F oral Pulse rate:   84 / minute Pulse rhythm:   regular BP sitting:   124 / 80  (right arm) Cuff size:   small  Vitals Entered By: Almeta Monas CMA Duncan Dull) (December 02, 2010 3:18 PM)  O2 Flow:  Room air CC: still having sinus pressure, Headaches and lack of energy, URI symptoms   History of Present Illness:       This is a 65 year old woman who presents with URI symptoms.  The patient complains of nasal congestion, purulent nasal discharge, and productive cough, but denies clear nasal discharge, dry cough, earache, and sick contacts.  The patient denies fever, low-grade fever (<100.5 degrees), fever of 100.5-103 degrees, fever of 103.1-104 degrees, fever to >104 degrees, stiff neck, dyspnea, wheezing, rash, vomiting, diarrhea, use of an antipyretic, and response to antipyretic.  The patient denies itchy watery eyes, itchy throat, sneezing, seasonal symptoms, response to antihistamine, headache, muscle aches, and severe fatigue.  The patient denies the following risk factors for Strep sinusitis: unilateral facial pain, unilateral nasal discharge, poor response to decongestant, double sickening, tooth pain, Strep exposure, tender adenopathy, and absence of cough.    Problems Prior to Update: 1)  Constipation  (ICD-564.00) 2)  Fibrocystic Breast Disease, Hx of  (ICD-V13.9) 3)  Breast Pain, Left  (ICD-611.71) 4)  Colorectal Cancer, Family Hx  (ICD-V16.0) 5)  Fatigue  (ICD-780.79) 6)  Gerd  (ICD-530.81) 7)  Cad, Native Vessel  (ICD-414.01) 8)  Chest Pain, Non-cardiac  (ICD-786.59) 9)  Carotid Artery Disease  (ICD-433.10) 10)  Hypothyroidism  (ICD-244.9) 11)  Tobacco Abuse, Hx of  (ICD-V15.82) 12)  Hypertension  (ICD-401.9) 13)  Dyspnea On Exertion  (ICD-786.09) 14)  Generalized  Anxiety Disorder  (ICD-300.02) 15)  Acute Bronchitis  (ICD-466.0) 16)  Sinusitis  (ICD-473.9) 17)  Breast Pain, Right  (ICD-611.71) 18)  Fibromyalgia  (ICD-729.1) 19)  Family History Diabetes 1st Degree Relative  (ICD-V18.0) 20)  Family History Breast Cancer 1st Degree Relative <50  (ICD-V16.3) 21)  Skin Cancer, Hx of  (ICD-V10.83)  Medications Prior to Update: 1)  Hydrochlorothiazide 25 Mg Tabs (Hydrochlorothiazide) .Marland Kitchen.. 1 By Mouth Daily. 2)  Atenolol 50 Mg Tabs (Atenolol) .... 1/2 Tablet By Mouth Two Times A Day 3)  Premarin 0.9 Mg Tabs (Estrogens Conjugated) .Marland Kitchen.. 1 Tab Once Daily 4)  Trazodone Hcl 100 Mg Tabs (Trazodone Hcl) .Marland Kitchen.. 1 Tab Hs. 5)  Tramadol Hcl 50 Mg Tabs (Tramadol Hcl) .Marland Kitchen.. 1 or 2 Tabs Two Times A Day 6)  Nasonex 50 Mcg/act Susp (Mometasone Furoate) .Marland Kitchen.. 1-2 Sprays 7)  Clarinex 5 Mg Tabs (Desloratadine) .Marland Kitchen.. 1 By Mouth Once Daily 8)  Proair Hfa 108 (90 Base) Mcg/act Aers (Albuterol Sulfate) 9)  Hydrocortisone Ace-Pramoxine 2.5-1 % Crea (Hydrocortisone Ace-Pramoxine) .... Use As Directed. 10)  Neurontin 100 Mg Caps (Gabapentin) .... Three Times A Day 11)  Cymbalta 30 Mg Cpep (Duloxetine Hcl) .... Two By Mouth Once Daily 12)  Nitrostat 0.4 Mg Subl (Nitroglycerin) .Marland Kitchen.. 1 Tablet Under Tongue At Onset of Chest Pain; You May Repeat Every 5 Minutes For Up To 3 Doses. 13)  Aspirin 81 Mg Tbec (Aspirin) .... By Mouth Once Daily 14)  Crestor 10 Mg Tabs (Rosuvastatin  Calcium) .Marland Kitchen.. 1 By Mouth At Bedtime 15)  Zithromax Z-Pak 250 Mg Tabs (Azithromycin) .... By Mouth As Directed 16)  Zegerid 40-1100 Mg Caps (Omeprazole-Sodium Bicarbonate) .Marland Kitchen.. 1 By Mouth At Bedtime  Current Medications (verified): 1)  Hydrochlorothiazide 25 Mg Tabs (Hydrochlorothiazide) .Marland Kitchen.. 1 By Mouth Daily. 2)  Atenolol 50 Mg Tabs (Atenolol) .... 1/2 Tablet By Mouth Two Times A Day 3)  Premarin 0.9 Mg Tabs (Estrogens Conjugated) .Marland Kitchen.. 1 Tab Once Daily 4)  Trazodone Hcl 100 Mg Tabs (Trazodone Hcl) .Marland Kitchen.. 1 Tab Hs. 5)   Tramadol Hcl 50 Mg Tabs (Tramadol Hcl) .Marland Kitchen.. 1 or 2 Tabs Two Times A Day 6)  Nasonex 50 Mcg/act Susp (Mometasone Furoate) .Marland Kitchen.. 1-2 Sprays 7)  Clarinex 5 Mg Tabs (Desloratadine) .Marland Kitchen.. 1 By Mouth Once Daily 8)  Proair Hfa 108 (90 Base) Mcg/act Aers (Albuterol Sulfate) 9)  Hydrocortisone Ace-Pramoxine 2.5-1 % Crea (Hydrocortisone Ace-Pramoxine) .... Use As Directed. 10)  Neurontin 100 Mg Caps (Gabapentin) .... Three Times A Day 11)  Cymbalta 30 Mg Cpep (Duloxetine Hcl) .... Two By Mouth Once Daily 12)  Nitrostat 0.4 Mg Subl (Nitroglycerin) .Marland Kitchen.. 1 Tablet Under Tongue At Onset of Chest Pain; You May Repeat Every 5 Minutes For Up To 3 Doses. 13)  Aspirin 81 Mg Tbec (Aspirin) .... By Mouth Once Daily 14)  Crestor 10 Mg Tabs (Rosuvastatin Calcium) .Marland Kitchen.. 1 By Mouth At Bedtime 15)  Zithromax Z-Pak 250 Mg Tabs (Azithromycin) .... By Mouth As Directed 16)  Zegerid 40-1100 Mg Caps (Omeprazole-Sodium Bicarbonate) .Marland Kitchen.. 1 By Mouth At Bedtime  Allergies (verified): 1)  ! Pcn 2)  ! Talwin 3)  ! Codeine 4)  ! * Myocins  Past History:  Past Medical History: Last updated: 08/31/2010 Arthritis Asthma Skin cancer, hx of Chronic Bronchitis Frequent Headaches GERD Heart Arrythmia-Irregular heartbeat HTN Migraines  Polyps in colon Hypothyroidism  Past Surgical History: Last updated: 09/13/2010 Breast Biopsy Appendectomy Hysterectomy Tonsillectomy Skin cancer removal Vocal cord cyst removal Sinus Surgery 1980's Repair of right ankle 1996  Family History: Last updated: 09/19/2010 Rectal Cancer: Mother  Heart disease Family History of Arthritis Family History Breast cancer 1st degree relative <50 Family History Diabetes 1st degree relative Family History High cholesterol Family History Hypertension Family History Lung cancer Family History of Sudden Death Family History Osteoporosis Father-deceased, CHF/CAD age 7 Mother-alive, healthy 1 sister-alive and healthy Family History of  Irritable Bowel Syndrome: All her 3 childern  Social History: Last updated: 09/19/2010 Retired-nurse Married Childern Former tobacco abuse-stopped August 2011 (Total of 15 years, 1ppd) Alcohol use-yes Drug use-no Regular exercise-no Daily Caffeine Use: coffee  Risk Factors: Alcohol Use: <1 (11/24/2010) Caffeine Use: 0 (11/24/2010) Exercise: no (11/24/2010)  Risk Factors: Smoking Status: current (11/24/2010) Packs/Day: <0.25 (11/24/2010)  Family History: Reviewed history from 09/19/2010 and no changes required. Rectal Cancer: Mother  Heart disease Family History of Arthritis Family History Breast cancer 1st degree relative <50 Family History Diabetes 1st degree relative Family History High cholesterol Family History Hypertension Family History Lung cancer Family History of Sudden Death Family History Osteoporosis Father-deceased, CHF/CAD age 46 Mother-alive, healthy 1 sister-alive and healthy Family History of Irritable Bowel Syndrome: All her 3 childern  Social History: Reviewed history from 09/19/2010 and no changes required. Retired-nurse Married Childern Former tobacco abuse-stopped August 2011 (Total of 15 years, 1ppd) Alcohol use-yes Drug use-no Regular exercise-no Daily Caffeine Use: coffee  Review of Systems      See HPI  Physical Exam  General:  Well-developed,well-nourished,in no acute distress; alert,appropriate and cooperative throughout  examination Ears:  External ear exam shows no significant lesions or deformities.  Otoscopic examination reveals clear canals, tympanic membranes are intact bilaterally without bulging, retraction, inflammation or discharge. Hearing is grossly normal bilaterally. Nose:  L frontal sinus tenderness, L maxillary sinus tenderness, R frontal sinus tenderness, and R maxillary sinus tenderness.   Mouth:  Oral mucosa and oropharynx without lesions or exudates.  Teeth in good repair. Neck:  No deformities, masses, or  tenderness noted. Lungs:  Normal respiratory effort, chest expands symmetrically. Lungs are clear to auscultation, no crackles or wheezes. Heart:  Normal rate and regular rhythm. S1 and S2 normal without gallop, murmur, click, rub or other extra sounds. Extremities:  No clubbing, cyanosis, edema, or deformity noted with normal full range of motion of all joints.   Cervical Nodes:  No lymphadenopathy noted Psych:  Cognition and judgment appear intact. Alert and cooperative with normal attention span and concentration. No apparent delusions, illusions, hallucinations   Impression & Recommendations:  Problem # 1:  SINUSITIS (ICD-473.9)  Her updated medication list for this problem includes:    Nasonex 50 Mcg/act Susp (Mometasone furoate) .Marland Kitchen... 1-2 sprays    Zithromax Z-pak 250 Mg Tabs (Azithromycin) ..... By mouth as directed  Take antibiotics for full duration. Discussed treatment options including indications for coronal CT scan of sinuses and ENT referral.   Problem # 2:  ACUTE BRONCHITIS (ICD-466.0) Assessment: Improved  Her updated medication list for this problem includes:    Proair Hfa 108 (90 Base) Mcg/act Aers (Albuterol sulfate)    Zithromax Z-pak 250 Mg Tabs (Azithromycin) ..... By mouth as directed  Take antibiotics for full duration. Discussed treatment options including indications for coronal CT scan of sinuses and ENT referral.   Take antibiotics and other medications as directed. Encouraged to push clear liquids, get enough rest, and take acetaminophen as needed. To be seen in 5-7 days if no improvement, sooner if worse.  Complete Medication List: 1)  Hydrochlorothiazide 25 Mg Tabs (Hydrochlorothiazide) .Marland Kitchen.. 1 by mouth daily. 2)  Atenolol 50 Mg Tabs (Atenolol) .... 1/2 tablet by mouth two times a day 3)  Premarin 0.9 Mg Tabs (Estrogens conjugated) .Marland Kitchen.. 1 tab once daily 4)  Trazodone Hcl 100 Mg Tabs (Trazodone hcl) .Marland Kitchen.. 1 tab hs. 5)  Tramadol Hcl 50 Mg Tabs (Tramadol  hcl) .Marland Kitchen.. 1 or 2 tabs two times a day 6)  Nasonex 50 Mcg/act Susp (Mometasone furoate) .Marland Kitchen.. 1-2 sprays 7)  Clarinex 5 Mg Tabs (Desloratadine) .Marland Kitchen.. 1 by mouth once daily 8)  Proair Hfa 108 (90 Base) Mcg/act Aers (Albuterol sulfate) 9)  Hydrocortisone Ace-pramoxine 2.5-1 % Crea (Hydrocortisone ace-pramoxine) .... Use as directed. 10)  Neurontin 100 Mg Caps (Gabapentin) .... Three times a day 11)  Cymbalta 30 Mg Cpep (Duloxetine hcl) .... Two by mouth once daily 12)  Nitrostat 0.4 Mg Subl (Nitroglycerin) .Marland Kitchen.. 1 tablet under tongue at onset of chest pain; you may repeat every 5 minutes for up to 3 doses. 13)  Aspirin 81 Mg Tbec (Aspirin) .... By mouth once daily 14)  Crestor 10 Mg Tabs (Rosuvastatin calcium) .Marland Kitchen.. 1 by mouth at bedtime 15)  Zithromax Z-pak 250 Mg Tabs (Azithromycin) .... By mouth as directed 16)  Zegerid 40-1100 Mg Caps (Omeprazole-sodium bicarbonate) .Marland Kitchen.. 1 by mouth at bedtime Prescriptions: ZITHROMAX Z-PAK 250 MG TABS (AZITHROMYCIN) by mouth as directed  #1 x 0   Entered and Authorized by:   Loreen Freud DO   Signed by:   Loreen Freud DO on 12/02/2010  Method used:   Electronically to        [id number] (retail)       [address] [place], [place]  [address]       Ph: [phone] or [phone]       Fax: [phone]   RxID:   [id number]    Orders Added: 1)  Est. Patient Level III [17616]

## 2011-01-26 NOTE — Letter (Signed)
Summary: EGD Instructions   Gastroenterology  [address] [place], [place] [address]   Phone: [phone]  Fax: [phone]       [name]    1946/01/16    MRN: [id number]       Procedure Day /Date:FRIDAY 12/30/2010      Arrival Time: 11:30AM     Procedure Time:12:30pm     Location of Procedure:                     x  [facility] ( Outpatient Registration)   PREPARATION FOR ENDOSCOPY   On 12/30/2010 THE DAY OF THE PROCEDURE:  1.   No solid foods, milk or milk products are allowed after midnight the night before your procedure.  2.   Do not drink anything colored red or purple.  Avoid juices with pulp.  No orange juice.  3.  You may drink clear liquids until8:30am , which is 4 hours before your procedure.                                                                                                CLEAR LIQUIDS INCLUDE: Water Jello Ice Popsicles Tea (sugar ok, no milk/cream) Powdered fruit flavored drinks Coffee (sugar ok, no milk/cream) Gatorade Juice: apple, white grape, white cranberry  Lemonade Clear bullion, consomm, broth Carbonated beverages (any kind) Strained chicken noodle soup Hard Candy   MEDICATION INSTRUCTIONS  Unless otherwise instructed, you should take regular prescription medications with a small sip of water as early as possible the morning of your procedure.              OTHER INSTRUCTIONS  You will need a responsible adult at least 66 years of age to accompany you and drive you home.   This person must remain in the waiting room during your procedure.  Wear loose fitting clothing that is easily removed.  Leave jewelry and other valuables at home.  However, you may wish to bring a book to read or an iPod/MP3 player to listen to music as you wait for your procedure to start.  Remove all body piercing jewelry and leave at home.  Total time from sign-in until discharge is approximately 2-3 hours.  You should go  home directly after your procedure and rest.  You can resume normal activities the day after your procedure.  The day of your procedure you should not:   Drive   Make legal decisions   Operate machinery   Drink alcohol   Return to work  You will receive specific instructions about eating, activities and medications before you leave.    The above instructions have been reviewed and explained to me by   _______________________    I fully understand and can verbalize these instructions _____________________________ Date _________

## 2011-01-27 ENCOUNTER — Other Ambulatory Visit: Payer: Self-pay | Admitting: Family Medicine

## 2011-01-27 ENCOUNTER — Other Ambulatory Visit (HOSPITAL_COMMUNITY)
Admission: RE | Admit: 2011-01-27 | Discharge: 2011-01-27 | Disposition: A | Discharge status: Home / Self Care | Payer: Medicare Other | Source: Ambulatory Visit | Attending: Family Medicine | Admitting: Family Medicine

## 2011-01-27 ENCOUNTER — Encounter: Payer: Self-pay | Admitting: Family Medicine

## 2011-01-27 ENCOUNTER — Encounter (INDEPENDENT_AMBULATORY_CARE_PROVIDER_SITE_OTHER): Payer: Medicare Other | Admitting: Family Medicine

## 2011-01-27 DIAGNOSIS — Z0000 Encounter for general adult medical examination without abnormal findings: Secondary | ICD-10-CM

## 2011-01-27 DIAGNOSIS — E039 Hypothyroidism, unspecified: Secondary | ICD-10-CM

## 2011-01-27 DIAGNOSIS — I10 Essential (primary) hypertension: Secondary | ICD-10-CM

## 2011-01-27 DIAGNOSIS — Z23 Encounter for immunization: Secondary | ICD-10-CM

## 2011-01-27 DIAGNOSIS — Z780 Asymptomatic menopausal state: Secondary | ICD-10-CM | POA: Insufficient documentation

## 2011-01-27 DIAGNOSIS — K219 Gastro-esophageal reflux disease without esophagitis: Secondary | ICD-10-CM

## 2011-01-27 DIAGNOSIS — Z124 Encounter for screening for malignant neoplasm of cervix: Secondary | ICD-10-CM

## 2011-01-27 DIAGNOSIS — N760 Acute vaginitis: Secondary | ICD-10-CM | POA: Insufficient documentation

## 2011-01-27 DIAGNOSIS — Z01419 Encounter for gynecological examination (general) (routine) without abnormal findings: Secondary | ICD-10-CM | POA: Insufficient documentation

## 2011-01-27 LAB — CBC WITH DIFFERENTIAL/PLATELET
Basophils Relative: 0.7 % (ref 0.0–3.0)
Eosinophils Absolute: 0.2 10*3/uL (ref 0.0–0.7)
Eosinophils Relative: 3.2 % (ref 0.0–5.0)
Hemoglobin: 12.9 g/dL (ref 12.0–15.0)
Lymphocytes Relative: 33.6 % (ref 12.0–46.0)
MCHC: 34.2 g/dL (ref 30.0–36.0)
MCV: 93.1 fl (ref 78.0–100.0)
Monocytes Absolute: 0.4 10*3/uL (ref 0.1–1.0)
Neutro Abs: 3.2 10*3/uL (ref 1.4–7.7)
RBC: 4.06 Mil/uL (ref 3.87–5.11)
WBC: 5.9 10*3/uL (ref 4.5–10.5)

## 2011-01-27 LAB — HEPATIC FUNCTION PANEL
AST: 57 U/L — ABNORMAL HIGH (ref 0–37)
Alkaline Phosphatase: 83 U/L (ref 39–117)
Total Bilirubin: 0.5 mg/dL (ref 0.3–1.2)

## 2011-01-27 LAB — LIPID PANEL
HDL: 58.2 mg/dL (ref >39.00)
LDL Cholesterol: 64 mg/dL (ref 0–99)
Total CHOL/HDL Ratio: 2

## 2011-01-27 LAB — BASIC METABOLIC PANEL
CO2: 34 mEq/L — ABNORMAL HIGH (ref 19–32)
Calcium: 9.1 mg/dL (ref 8.4–10.5)
Chloride: 101 mEq/L (ref 96–112)
Potassium: 4.4 mEq/L (ref 3.5–5.1)
Sodium: 141 mEq/L (ref 135–145)

## 2011-01-30 ENCOUNTER — Ambulatory Visit: Payer: Self-pay | Admitting: Gastroenterology

## 2011-01-30 LAB — CONVERTED CEMR LAB: Vit D, 25-Hydroxy: 37 ng/mL (ref 30–89)

## 2011-01-31 ENCOUNTER — Telehealth (INDEPENDENT_AMBULATORY_CARE_PROVIDER_SITE_OTHER): Payer: Self-pay | Admitting: *Deleted

## 2011-02-01 NOTE — Assessment & Plan Note (Addendum)
Summary: physical   Vital Signs:  Patient profile:   65 year old female Height:      64 inches Weight:      112 pounds Pulse rate:   68 / minute Pulse rhythm:   regular BP sitting:   132 / 76  (right arm) Cuff size:   regular  Vitals Entered By: Almeta Monas CMA Duncan Dull) (January 27, 2011 10:42 AM) CC: CPX-Fasting with pap  Does patient need assistance? Functional Status Self care, Cook/clean, Shopping, Social activities Ambulation Normal Comments Pt is able to do all ADLs and can read and write.    Vision Screening:      Vision Comments: + optho q1y---  20/20  20/25 40db HL: Left  Right  Audiometry Comment: grossly normal    History of Present Illness: Pt here for cpe , pap and labs.  Pt to see cardio next week-- Dr Clifton James.  She is having surgery Monday with Dr Mina Marble.   For Fibro she sees Dr Corliss Skains,  Neuro --Dr Hyacinth Meeker for HA.  +ophtho--Dr Berta Minor,  Dentist--Dr B Arguello.   Dr Gerald Leitz--   Pt has no complaints.    Preventive Screening-Counseling & Management  Alcohol-Tobacco     Alcohol drinks/day: <1     Smoking Status: current     Smoking Cessation Counseling: yes     Packs/Day: <0.25     Year Started: 1967     Year Quit: quit several times--started 2 years ago  Caffeine-Diet-Exercise     Caffeine use/day: 0     Does Patient Exercise: yes     Type of exercise: gym,  treadmill,  weights     Exercise (avg: min/session): 30-60     Times/week: 3  Hep-HIV-STD-Contraception     Dental Visit-last 6 months yes     Dental Care Counseling: not indicated; dental care within six months     SBE monthly: yes     SBE Education/Counseling: not indicated; SBE done regularly  Safety-Violence-Falls     Firearms in the Home: firearms in the home     Firearm Counseling: not indicated; uses recommended firearm safety measures     Smoke Detectors: yes     Smoke Detector Counseling: n/a     Violence in the Home: no risk noted     Sexual Abuse: no     Fall  Risk: no  Problems Prior to Update: 1)  Preventive Health Care  (ICD-V70.0) 2)  Postmenopausal Status  (ICD-V49.81) 3)  Dysphagia Unspecified  (ICD-787.20) 4)  Constipation  (ICD-564.00) 5)  Fibrocystic Breast Disease, Hx of  (ICD-V13.9) 6)  Breast Pain, Left  (ICD-611.71) 7)  Colorectal Cancer, Family Hx  (ICD-V16.0) 8)  Fatigue  (ICD-780.79) 9)  Gerd  (ICD-530.81) 10)  Cad, Native Vessel  (ICD-414.01) 11)  Chest Pain, Non-cardiac  (ICD-786.59) 12)  Carotid Artery Disease  (ICD-433.10) 13)  Hypothyroidism  (ICD-244.9) 14)  Tobacco Abuse, Hx of  (ICD-V15.82) 15)  Hypertension  (ICD-401.9) 16)  Dyspnea On Exertion  (ICD-786.09) 17)  Generalized Anxiety Disorder  (ICD-300.02) 18)  Acute Bronchitis  (ICD-466.0) 19)  Sinusitis  (ICD-473.9) 20)  Breast Pain, Right  (ICD-611.71) 21)  Fibromyalgia  (ICD-729.1) 22)  Family History Diabetes 1st Degree Relative  (ICD-V18.0) 23)  Family History Breast Cancer 1st Degree Relative <50  (ICD-V16.3) 24)  Skin Cancer, Hx of  (ICD-V10.83)  Medications Prior to Update: 1)  Hydrochlorothiazide 25 Mg Tabs (Hydrochlorothiazide) .Marland Kitchen.. 1 By Mouth Daily. 2)  Atenolol 50 Mg Tabs (  Atenolol) .... 1/2 Tablet By Mouth Two Times A Day 3)  Premarin 0.9 Mg Tabs (Estrogens Conjugated) .Marland Kitchen.. 1 Tab Once Daily 4)  Trazodone Hcl 100 Mg Tabs (Trazodone Hcl) .Marland Kitchen.. 1 Tab Hs. 5)  Tramadol Hcl 50 Mg Tabs (Tramadol Hcl) .Marland Kitchen.. 1 or 2 Tabs Two Times A Day As Needed 6)  Nasonex 50 Mcg/act Susp (Mometasone Furoate) .Marland Kitchen.. 1-2 Sprays in Each Nostril As Needed 7)  Clarinex 5 Mg Tabs (Desloratadine) .Marland Kitchen.. 1 By Mouth Once Daily As Needed 8)  Proair Hfa 108 (90 Base) Mcg/act Aers (Albuterol Sulfate) 9)  Hydrocortisone Ace-Pramoxine 2.5-1 % Crea (Hydrocortisone Ace-Pramoxine) .... Use As Directed. 10)  Neurontin 100 Mg Caps (Gabapentin) .... Three Times A Day 11)  Cymbalta 60 Mg Cpep (Duloxetine Hcl) .... Take 2 By Mouth Once Daily 12)  Nitrostat 0.4 Mg Subl (Nitroglycerin) .Marland Kitchen.. 1  Tablet Under Tongue At Onset of Chest Pain; You May Repeat Every 5 Minutes For Up To 3 Doses. 13)  Aspirin 81 Mg Tbec (Aspirin) .... By Mouth Once Daily 14)  Crestor 10 Mg Tabs (Rosuvastatin Calcium) .Marland Kitchen.. 1 By Mouth At Bedtime 15)  Zegerid 40-1100 Mg Caps (Omeprazole-Sodium Bicarbonate) .Marland Kitchen.. 1 By Mouth At Bedtime 16)  Kristalose 10 Gm Pack (Lactulose) .... Take 15cc Two Times A Day  Current Medications (verified): 1)  Hydrochlorothiazide 25 Mg Tabs (Hydrochlorothiazide) .Marland Kitchen.. 1 By Mouth Daily. 2)  Atenolol 50 Mg Tabs (Atenolol) .... 1/2 Tablet By Mouth Two Times A Day 3)  Premarin 0.9 Mg Tabs (Estrogens Conjugated) .Marland Kitchen.. 1 Tab Once Daily 4)  Trazodone Hcl 100 Mg Tabs (Trazodone Hcl) .Marland Kitchen.. 1 Tab Hs. 5)  Tramadol Hcl 50 Mg Tabs (Tramadol Hcl) .Marland Kitchen.. 1 or 2 Tabs Two Times A Day As Needed 6)  Nasonex 50 Mcg/act Susp (Mometasone Furoate) .Marland Kitchen.. 1-2 Sprays in Each Nostril As Needed 7)  Clarinex 5 Mg Tabs (Desloratadine) .Marland Kitchen.. 1 By Mouth Once Daily As Needed 8)  Proair Hfa 108 (90 Base) Mcg/act Aers (Albuterol Sulfate) 9)  Hydrocortisone Ace-Pramoxine 2.5-1 % Crea (Hydrocortisone Ace-Pramoxine) .... Use As Directed. 10)  Neurontin 100 Mg Caps (Gabapentin) .... Three Times A Day 11)  Cymbalta 60 Mg Cpep (Duloxetine Hcl) .... Take 2 By Mouth Once Daily 12)  Nitrostat 0.4 Mg Subl (Nitroglycerin) .Marland Kitchen.. 1 Tablet Under Tongue At Onset of Chest Pain; You May Repeat Every 5 Minutes For Up To 3 Doses. 13)  Aspirin 81 Mg Tbec (Aspirin) .... By Mouth Once Daily 14)  Crestor 10 Mg Tabs (Rosuvastatin Calcium) .Marland Kitchen.. 1 By Mouth At Bedtime 15)  Zegerid 40-1100 Mg Caps (Omeprazole-Sodium Bicarbonate) .Marland Kitchen.. 1 By Mouth At Bedtime 16)  Kristalose 10 Gm Pack (Lactulose) .... Take 15cc Two Times A Day 17)  Zostavax 45409 Unt/0.45ml Solr (Zoster Vaccine Live) .Marland Kitchen.. 1 Ml Im X1  Allergies (verified): 1)  ! Pcn 2)  ! Talwin 3)  ! Codeine 4)  ! * Myocins  Past History:  Past Medical History: Last updated:  08/31/2010 Arthritis Asthma Skin cancer, hx of Chronic Bronchitis Frequent Headaches GERD Heart Arrythmia-Irregular heartbeat HTN Migraines  Polyps in colon Hypothyroidism  Past Surgical History: Last updated: 09/13/2010 Breast Biopsy Appendectomy Hysterectomy Tonsillectomy Skin cancer removal Vocal cord cyst removal Sinus Surgery 1980's Repair of right ankle 1996  Family History: Last updated: 09/19/2010 Rectal Cancer: Mother  Heart disease Family History of Arthritis Family History Breast cancer 1st degree relative <50 Family History Diabetes 1st degree relative Family History High cholesterol Family History Hypertension Family History Lung cancer Family History of  Sudden Death Family History Osteoporosis Father-deceased, CHF/CAD age 91 Mother-alive, healthy 1 sister-alive and healthy Family History of Irritable Bowel Syndrome: All her 3 childern  Social History: Last updated: 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 Rectal Cancer: Mother  Heart disease Family History of Arthritis Family History Breast cancer 1st degree relative <50 Family History Diabetes 1st degree relative Family History High cholesterol Family History Hypertension Family History Lung cancer Family History of Sudden Death Family History Osteoporosis Father-deceased, CHF/CAD age 77 Mother-alive, healthy 1 sister-alive and healthy Family History of Irritable Bowel Syndrome: All her 3 childern  Social History: Reviewed history from 09/19/2010 and no changes  required. Retired-nurse Married Childern Former tobacco abuse-stopped August 2011 (Total of 15 years, 1ppd) Alcohol use-yes Drug use-no Regular exercise-no Daily Caffeine Use: coffee Does Patient Exercise:  yes Dental Care w/in 6 mos.:  yes Fall Risk:  no  Review of Systems      See HPI General:  Denies chills, fatigue, fever, loss of appetite, malaise, sleep disorder, sweats, weakness, and weight loss. Eyes:  Denies blurring, discharge, double vision, eye irritation, eye pain, halos, itching, light sensitivity, red eye, vision loss-1 eye, and vision loss-both eyes. ENT:  Denies decreased hearing, difficulty swallowing, ear discharge, earache, hoarseness, nasal congestion, nosebleeds, postnasal drainage, ringing in ears, sinus pressure, and sore throat. CV:  Denies bluish discoloration of lips or nails, chest pain or discomfort, difficulty breathing at night, difficulty breathing while lying down, fainting, fatigue, leg cramps with exertion, lightheadness, near fainting, palpitations, shortness of breath with exertion, swelling of feet, swelling of hands, and weight gain. Resp:  Denies chest discomfort, chest pain with inspiration, cough, coughing up blood, excessive snoring, hypersomnolence, morning headaches, pleuritic, shortness of breath, sputum productive, and wheezing. GI:  Denies abdominal pain, bloody stools, change in bowel habits, constipation, dark tarry stools, diarrhea, excessive appetite, gas, hemorrhoids, indigestion, loss of appetite, nausea, vomiting, vomiting blood, and yellowish skin color. GU:  Denies abnormal vaginal bleeding, decreased libido, discharge, dysuria, genital sores, hematuria, incontinence, nocturia, urinary frequency, and urinary hesitancy. MS:  Complains of joint pain; denies joint redness, joint swelling, loss of strength, low back pain, mid back pain, muscle aches, muscle , cramps, muscle weakness, stiffness, and thoracic pain; R hand . Derm:  Denies  changes in color of skin, changes in nail beds, dryness, excessive perspiration, flushing, hair loss, insect bite(s), itching, lesion(s), poor wound healing, and rash. Neuro:  Denies brief paralysis, difficulty with concentration, disturbances in coordination, falling down, headaches, inability to speak, memory loss, numbness, poor balance, seizures, sensation of room spinning, tingling, tremors, visual disturbances, and weakness. Psych:  Denies alternate hallucination ( auditory/visual), anxiety, depression, easily angered, easily tearful, irritability, mental problems, panic attacks, sense of great danger, suicidal thoughts/plans, thoughts of violence, unusual visions or sounds, and thoughts /plans of harming others. Endo:  Denies cold intolerance, excessive hunger, excessive thirst, excessive urination, heat intolerance, polyuria, and weight change. Heme:  Denies abnormal bruising, bleeding, enlarge lymph nodes, fevers, pallor, and skin discoloration. Allergy:  Denies hives or rash, itching eyes, persistent infections, seasonal allergies, and sneezing.  Physical Exam  General:  Well-developed,well-nourished,in no acute distress; alert,appropriate and cooperative throughout examination Head:  Normocephalic and atraumatic  without obvious abnormalities. No apparent alopecia or balding. Eyes:  pupils equal, pupils round, pupils reactive to light, and no injection.   Ears:  External ear exam shows no significant lesions or deformities.  Otoscopic examination reveals clear canals, tympanic membranes are intact bilaterally without bulging, retraction, inflammation or discharge. Hearing is grossly normal bilaterally. Nose:  External nasal examination shows no deformity or inflammation. Nasal mucosa are pink and moist without lesions or exudates. Mouth:  Oral mucosa and oropharynx without lesions or exudates.  Teeth in good repair. Neck:  No deformities, masses, or tenderness noted. Chest Wall:  No  deformities, masses, or tenderness noted. Breasts:  No mass, nodules, thickening, tenderness, bulging, retraction, inflamation, nipple discharge or skin changes noted.   Lungs:  Normal respiratory effort, chest expands symmetrically. Lungs are clear to auscultation, no crackles or wheezes. Heart:  normal rate and no murmur.   Abdomen:  Bowel sounds positive,abdomen soft and non-tender without masses, organomegaly or hernias noted. Rectal:  No external abnormalities noted. Normal sphincter tone. No rectal masses or tenderness. Genitalia:  normal introitus, no external lesions, no vaginal discharge, mucosa pink and moist, no vaginal or cervical lesions, no vaginal atrophy, no friaility or hemorrhage, and no adnexal masses or tenderness.  + vaginal smear done Msk:  normal ROM, no joint tenderness, no joint swelling, no joint warmth, no redness over joints, no joint instability, and no crepitation.   Pulses:  R posterior tibial normal, R dorsalis pedis normal, R carotid normal, L posterior tibial normal, L dorsalis pedis normal, and L carotid normal.   Extremities:  No clubbing, cyanosis, edema, or deformity noted with normal full range of motion of all joints.   Neurologic:  alert & oriented X3 and strength normal in all extremities.   Skin:  Intact without suspicious lesions or rashes Cervical Nodes:  No lymphadenopathy noted Axillary Nodes:  No palpable lymphadenopathy Psych:  Oriented X3, memory intact for recent and remote, normally interactive, good eye contact, not anxious appearing, not depressed appearing, not agitated, not suicidal, and not homicidal.     Impression & Recommendations:  Problem # 1:  PREVENTIVE HEALTH CARE (ICD-V70.0)  Orders: Venipuncture (04540) TLB-Lipid Panel (80061-LIPID) TLB-BMP (Basic Metabolic Panel-BMET) (80048-METABOL) TLB-CBC Platelet - w/Differential (85025-CBCD) TLB-Hepatic/Liver Function Pnl (80076-HEPATIC) TLB-TSH (Thyroid Stimulating Hormone)  (84443-TSH) T-Vitamin D (25-Hydroxy) (98119-14782) Specimen Handling (95621) Welcome to Medicare, Physical (H0865) EKG w/ Interpretation (93000)  Problem # 2:  POSTMENOPAUSAL STATUS (ICD-V49.81)  Orders: Radiology Referral (Radiology) Venipuncture (78469) TLB-Lipid Panel (80061-LIPID) TLB-BMP (Basic Metabolic Panel-BMET) (80048-METABOL) TLB-CBC Platelet - w/Differential (85025-CBCD) TLB-Hepatic/Liver Function Pnl (80076-HEPATIC) TLB-TSH (Thyroid Stimulating Hormone) (84443-TSH) T-Vitamin D (25-Hydroxy) (62952-84132) Specimen Handling (44010)  Problem # 3:  GERD (ICD-530.81)  Her updated medication list for this problem includes:    Zegerid 40-1100 Mg Caps (Omeprazole-sodium bicarbonate) .Marland Kitchen... 1 by mouth at bedtime  Diagnostics Reviewed:  EGD: DONE (12/30/2010) Discussed lifestyle modifications, diet, antacids/medications, and preventive measures. Handout provided.   Problem # 4:  HYPOTHYROIDISM (ICD-244.9)  Orders: Venipuncture (27253) TLB-Lipid Panel (80061-LIPID) TLB-BMP (Basic Metabolic Panel-BMET) (80048-METABOL) TLB-CBC Platelet - w/Differential (85025-CBCD) TLB-Hepatic/Liver Function Pnl (80076-HEPATIC) TLB-TSH (Thyroid Stimulating Hormone) (84443-TSH) T-Vitamin D (25-Hydroxy) (66440-34742) Specimen Handling (59563)  Labs Reviewed: TSH: 1.532 (01/11/2010)     Problem # 5:  CAROTID ARTERY DISEASE (ICD-433.10)  Her updated medication list for this problem includes:    Aspirin 81 Mg Tbec (Aspirin) ..... By mouth once daily  Carotid Duplex Scan:  SUMMARY:   -  Vertebral artery flow antegrade bilaterally.   -  No significant right ICA stenosis noted.   -  No significant left ICA stenosis noted.     ---------------------------------------------------------------   Prepared and Electronically Authenticated by    Waverly Ferrari MD   Confirmed 11-Sep-2007 14:47:15  (09/11/2007)  Echocardiogram:  Study Conclusions    - Left ventricle: The cavity size  was normal. Systolic function was     normal. The estimated ejection fraction was in the range of 55% to     60%. Wall motion was normal; there were no regional wall motion     abnormalities.   - Mitral valve: Mild regurgitation.   Transthoracic echocardiography. M-mode, complete 2D, spectral   Doppler, and color Doppler. Height: Height: 165.1cm. Height: 65in.   Weight: Weight: 45.4kg. Weight: 99.8lb. Body mass index: BMI:   16.6kg/m^2. Body surface area: BSA: 1.47m^2. Blood pressure: 150/72.   Patient status: Outpatient. Location: [facility]  (11/14/2010)  Problem # 6:  HYPERTENSION (ICD-401.9)  Her updated medication list for this problem includes:    Hydrochlorothiazide 25 Mg Tabs (Hydrochlorothiazide) .Marland Kitchen... 1 by mouth daily.    Atenolol 50 Mg Tabs (Atenolol) .Marland Kitchen... 1/2 tablet by mouth two times a day  Orders: Venipuncture (16109) TLB-Lipid Panel (80061-LIPID) TLB-BMP (Basic Metabolic Panel-BMET) (80048-METABOL) TLB-CBC Platelet - w/Differential (85025-CBCD) TLB-Hepatic/Liver Function Pnl (80076-HEPATIC) TLB-TSH (Thyroid Stimulating Hormone) (84443-TSH) T-Vitamin D (25-Hydroxy) (60454-09811) Specimen Handling (91478)  BP today: 132/76 Prior BP: 128/78 (12/29/2010)  Complete Medication List: 1)  Hydrochlorothiazide 25 Mg Tabs (Hydrochlorothiazide) .Marland Kitchen.. 1 by mouth daily. 2)  Atenolol 50 Mg Tabs (Atenolol) .... 1/2 tablet by mouth two times a day 3)  Premarin 0.9 Mg Tabs (Estrogens conjugated) .Marland Kitchen.. 1 tab once daily 4)  Trazodone Hcl 100 Mg Tabs (Trazodone hcl) .Marland Kitchen.. 1 tab hs. 5)  Tramadol Hcl 50 Mg Tabs (Tramadol hcl) .Marland Kitchen.. 1 or 2 tabs two times a day as needed 6)  Nasonex 50 Mcg/act Susp (Mometasone furoate) .Marland Kitchen.. 1-2 sprays in each nostril as needed 7)  Clarinex 5 Mg Tabs (Desloratadine) .Marland Kitchen.. 1 by mouth once daily as needed 8)  Proair Hfa 108 (90 Base) Mcg/act Aers (Albuterol sulfate) 9)  Hydrocortisone Ace-pramoxine 2.5-1 % Crea (Hydrocortisone ace-pramoxine) .... Use as  directed. 10)  Neurontin 100 Mg Caps (Gabapentin) .... Three times a day 11)  Cymbalta 60 Mg Cpep (Duloxetine hcl) .... Take 2 by mouth once daily 12)  Nitrostat 0.4 Mg Subl (Nitroglycerin) .Marland Kitchen.. 1 tablet under tongue at onset of chest pain; you may repeat every 5 minutes for up to 3 doses. 13)  Aspirin 81 Mg Tbec (Aspirin) .... By mouth once daily 14)  Crestor 10 Mg Tabs (Rosuvastatin calcium) .Marland Kitchen.. 1 by mouth at bedtime 15)  Zegerid 40-1100 Mg Caps (Omeprazole-sodium bicarbonate) .Marland Kitchen.. 1 by mouth at bedtime 16)  Kristalose 10 Gm Pack (Lactulose) .... Take 15cc two times a day 17)  Zostavax 29562 Unt/0.35ml Solr (Zoster vaccine live) .Marland Kitchen.. 1 ml im x1  Other Orders: State-Pneumococcal Vaccine (13086V) Admin 1st Vaccine (78469)  Patient Instructions: 1)  Please schedule a follow-up appointment in 6 months .  Prescriptions: ZOSTAVAX 62952 UNT/0.65ML SOLR (ZOSTER VACCINE LIVE) 1 ml IM x1  #1 x 0   Entered and Authorized by:   Loreen Freud DO   Signed by:   Loreen Freud DO on 01/27/2011   Method used:   Print then Give to Patient   RxID:   [id number]    Orders Added: 1)  Radiology Referral [Radiology] 2)  Venipuncture [64403] 3)  TLB-Lipid Panel [80061-LIPID]  4)  TLB-BMP (Basic Metabolic Panel-BMET) [80048-METABOL] 5)  TLB-CBC Platelet - w/Differential [85025-CBCD] 6)  TLB-Hepatic/Liver Function Pnl [80076-HEPATIC] 7)  TLB-TSH (Thyroid Stimulating Hormone) [84443-TSH] 8)  T-Vitamin D (25-Hydroxy) [16109-60454] 9)  Specimen Handling [99000] 10)  State-Pneumococcal Vaccine [90732S] 11)  Admin 1st Vaccine [90471] 12)  Welcome to Medicare, Physical [G0402] 13)  EKG w/ Interpretation [93000] 14)  Est. Patient Level III [09811]   Immunizations Administered:  Pneumonia Vaccine:    Vaccine Type: Pneumovax (State)    Site: right deltoid    Mfr: Merck    Dose: 0.5 ml    Route: IM    Given by: Almeta Monas CMA (AAMA)    Exp. Date: 04/25/2012    Lot #: [id number]    VIS given:  11/29/09 version given January 27, 2011.   Immunizations Administered:  Pneumonia Vaccine:    Vaccine Type: Pneumovax (State)    Site: right deltoid    Mfr: Merck    Dose: 0.5 ml    Route: IM    Given by: Almeta Monas CMA (AAMA)    Exp. Date: 04/25/2012    Lot #: [id number]    VIS given: 11/29/09 version given January 27, 2011.  Mammogram Result Date:  01/16/2011 Mammogram Result:  normal Mammogram Next Due:  1 yr    Appended Document: physical  Laboratory Results   Urine Tests   Date/Time Reported: January 27, 2011 1:21 PM   Routine Urinalysis   Color: yellow Appearance: Clear Glucose: negative   (Normal Range: Negative) Bilirubin: negative   (Normal Range: Negative) Ketone: negative   (Normal Range: Negative) Spec. Gravity: <1.005   (Normal Range: 1.003-1.035) Blood: negative   (Normal Range: Negative) pH: 7.0   (Normal Range: 5.0-8.0) Protein: negative   (Normal Range: Negative) Urobilinogen: negative   (Normal Range: 0-1) Nitrite: negative   (Normal Range: Negative) Leukocyte Esterace: negative   (Normal Range: Negative)    Comments: Floydene Flock  January 27, 2011 1:21 PM      Appended Document: physical     Vital Signs:  Patient profile:   65 year old female Height:      64 inches Weight:      112 pounds BMI:     19.29  Vitals Entered By: Almeta Monas CMA Duncan Dull) (February 02, 2011 4:04 PM)  Allergies: 1)  ! Pcn 2)  ! Talwin 3)  ! Codeine 4)  ! * Myocins   Complete Medication List: 1)  Hydrochlorothiazide 25 Mg Tabs (Hydrochlorothiazide) .Marland Kitchen.. 1 by mouth daily. 2)  Atenolol 50 Mg Tabs (Atenolol) .... 1/2 tablet by mouth two times a day 3)  Premarin 0.9 Mg Tabs (Estrogens conjugated) .Marland Kitchen.. 1 tab once daily 4)  Trazodone Hcl 100 Mg Tabs (Trazodone hcl) .Marland Kitchen.. 1 tab hs. 5)  Tramadol Hcl 50 Mg Tabs (Tramadol hcl) .Marland Kitchen.. 1 or 2 tabs two times a day as needed 6)  Nasonex 50 Mcg/act Susp (Mometasone furoate) .Marland Kitchen.. 1-2 sprays in each nostril as  needed 7)  Clarinex 5 Mg Tabs (Desloratadine) .Marland Kitchen.. 1 by mouth once daily as needed 8)  Proair Hfa 108 (90 Base) Mcg/act Aers (Albuterol sulfate) 9)  Hydrocortisone Ace-pramoxine 2.5-1 % Crea (Hydrocortisone ace-pramoxine) .... Use as directed. 10)  Neurontin 100 Mg Caps (Gabapentin) .... Three times a day 11)  Cymbalta 60 Mg Cpep (Duloxetine hcl) .... Take 2 by mouth once daily 12)  Nitrostat 0.4 Mg Subl (Nitroglycerin) .Marland Kitchen.. 1 tablet under tongue at onset of chest pain; you  may repeat every 5 minutes for up to 3 doses. 13)  Aspirin 81 Mg Tbec (Aspirin) .... By mouth once daily 14)  Crestor 10 Mg Tabs (Rosuvastatin calcium) .Marland Kitchen.. 1 by mouth at bedtime 15)  Zegerid 40-1100 Mg Caps (Omeprazole-sodium bicarbonate) .Marland Kitchen.. 1 by mouth at bedtime 16)  Kristalose 10 Gm Pack (Lactulose) .... Take 15cc two times a day 17)  Zostavax 27062 Unt/0.36ml Solr (Zoster vaccine live) .Marland Kitchen.. 1 ml im x1

## 2011-02-03 ENCOUNTER — Encounter: Payer: Self-pay | Admitting: Family Medicine

## 2011-02-06 ENCOUNTER — Encounter: Payer: Self-pay | Admitting: Family Medicine

## 2011-02-09 NOTE — Letter (Signed)
Summary: Annual Breast Health Update  Annual Breast Health Update   Imported By: Kassie Mends 02/03/2011 08:27:19  _____________________________________________________________________  External Attachment:    Type:   Image     Comment:   External Document

## 2011-02-09 NOTE — Progress Notes (Signed)
Summary: 2-7,2-8,2-9--reaction to vaccine  Phone Note Call from Patient Call back at [facility] Phone [phone]   Caller: Patient Summary of Call: Pt left VM to report reaction to Pneumovax vaccine. Pt c/o redness, and swelling at injection site. Pt note that redness and swelling extend from elbow to shoulder and it resembled hives. Left message to call office to get further details about reaction.........Marland KitchenFelecia Deloach CMA  January 31, 2011 12:09 PM  PT return call, left message to call office.............Marland KitchenFelecia Deloach CMA  January 31, 2011 3:37 PM  Left message to call office...............Marland KitchenFelecia Deloach CMA  February 01, 2011 4:42 PM  Pt return call left message to call office.........Marland KitchenFelecia Deloach CMA  February 02, 2011 4:49 PM  Left message to call office..........Marland KitchenFelecia Deloach CMA  February 03, 2011 4:47 PM   Follow-up for Phone Call        spoke w/ patient says that arm is better and hasn't had any further reactions....Marland KitchenMarland KitchenDoristine Devoid CMA  February 03, 2011 4:53 PM    New Allergies: ! PNEUMOVAX 23 New Allergies: ! PNEUMOVAX 23

## 2011-02-23 ENCOUNTER — Encounter: Payer: Self-pay | Admitting: Cardiovascular Disease

## 2011-02-23 ENCOUNTER — Ambulatory Visit (INDEPENDENT_AMBULATORY_CARE_PROVIDER_SITE_OTHER): Payer: Medicare Other | Admitting: Cardiovascular Disease

## 2011-02-23 DIAGNOSIS — I10 Essential (primary) hypertension: Secondary | ICD-10-CM

## 2011-02-23 DIAGNOSIS — I2510 Atherosclerotic heart disease of native coronary artery without angina pectoris: Secondary | ICD-10-CM

## 2011-02-24 ENCOUNTER — Other Ambulatory Visit: Payer: Self-pay | Admitting: Family Medicine

## 2011-02-24 ENCOUNTER — Other Ambulatory Visit (INDEPENDENT_AMBULATORY_CARE_PROVIDER_SITE_OTHER): Payer: Medicare Other

## 2011-02-24 ENCOUNTER — Encounter: Payer: Self-pay | Admitting: Family Medicine

## 2011-02-24 ENCOUNTER — Encounter (INDEPENDENT_AMBULATORY_CARE_PROVIDER_SITE_OTHER): Payer: Self-pay | Admitting: *Deleted

## 2011-02-24 ENCOUNTER — Ambulatory Visit (INDEPENDENT_AMBULATORY_CARE_PROVIDER_SITE_OTHER): Payer: Medicare Other | Admitting: Family Medicine

## 2011-02-24 DIAGNOSIS — M79609 Pain in unspecified limb: Secondary | ICD-10-CM

## 2011-02-24 DIAGNOSIS — M79606 Pain in leg, unspecified: Secondary | ICD-10-CM | POA: Insufficient documentation

## 2011-02-24 DIAGNOSIS — J019 Acute sinusitis, unspecified: Secondary | ICD-10-CM | POA: Insufficient documentation

## 2011-02-24 DIAGNOSIS — M810 Age-related osteoporosis without current pathological fracture: Secondary | ICD-10-CM

## 2011-02-24 HISTORY — DX: Pain in leg, unspecified: M79.606

## 2011-02-24 LAB — CREATININE, SERUM: Creatinine, Ser: 0.9 mg/dL (ref 0.4–1.2)

## 2011-02-28 ENCOUNTER — Encounter: Payer: Self-pay | Admitting: Family Medicine

## 2011-03-02 NOTE — Assessment & Plan Note (Signed)
Summary: face is swollen--ph   Vital Signs:  Patient profile:   65 year old female Weight:      114 pounds BMI:     19.64 Temp:     97.6 degrees F oral BP sitting:   130 / 80  (left arm)  Vitals Entered By: Doristine Devoid CMA (February 24, 2011 2:52 PM) CC: sinus pressure and HA along w/ ear pain x3 wks and painful L arm , URI symptoms   History of Present Illness:       This is a 65 year old woman who presents with URI symptoms.  The symptoms began 3 weeks ago.  Pt taking clarinex,  nasonex with little relief.  The patient complains of nasal congestion, purulent nasal discharge, productive cough, earache, and sick contacts.  The patient denies fever, low-grade fever (<100.5 degrees), fever of 100.5-103 degrees, fever of 103.1-104 degrees, fever to >104 degrees, stiff neck, dyspnea, wheezing, rash, vomiting, diarrhea, use of an antipyretic, and response to antipyretic.  The patient also reports headache.  The patient denies the following risk factors for Strep sinusitis: unilateral facial pain, unilateral nasal discharge, poor response to decongestant, double sickening, tooth pain, Strep exposure, tender adenopathy, and absence of cough.    Problems Prior to Update: 1)  Arm Pain, Left  (ICD-729.5) 2)  Preventive Health Care  (ICD-V70.0) 3)  Postmenopausal Status  (ICD-V49.81) 4)  Dysphagia Unspecified  (ICD-787.20) 5)  Constipation  (ICD-564.00) 6)  Fibrocystic Breast Disease, Hx of  (ICD-V13.9) 7)  Breast Pain, Left  (ICD-611.71) 8)  Colorectal Cancer, Family Hx  (ICD-V16.0) 9)  Fatigue  (ICD-780.79) 10)  Gerd  (ICD-530.81) 11)  Cad, Native Vessel  (ICD-414.01) 12)  Chest Pain, Non-cardiac  (ICD-786.59) 13)  Carotid Artery Disease  (ICD-433.10) 14)  Hypothyroidism  (ICD-244.9) 15)  Tobacco Abuse, Hx of  (ICD-V15.82) 16)  Hypertension  (ICD-401.9) 17)  Dyspnea On Exertion  (ICD-786.09) 18)  Generalized Anxiety Disorder  (ICD-300.02) 19)  Acute Bronchitis  (ICD-466.0) 20)  Sinusitis   (ICD-473.9) 21)  Breast Pain, Right  (ICD-611.71) 22)  Fibromyalgia  (ICD-729.1) 23)  Family History Diabetes 1st Degree Relative  (ICD-V18.0) 24)  Family History Breast Cancer 1st Degree Relative <50  (ICD-V16.3) 25)  Skin Cancer, Hx of  (ICD-V10.83)  Medications Prior to Update: 1)  Hydrochlorothiazide 25 Mg Tabs (Hydrochlorothiazide) .Marland Kitchen.. 1 By Mouth Daily. 2)  Atenolol 50 Mg Tabs (Atenolol) .... 1/2 Tablet By Mouth Two Times A Day 3)  Premarin 0.9 Mg Tabs (Estrogens Conjugated) .Marland Kitchen.. 1 Tab Once Daily 4)  Trazodone Hcl 100 Mg Tabs (Trazodone Hcl) .Marland Kitchen.. 1 Tab Hs. 5)  Tramadol Hcl 50 Mg Tabs (Tramadol Hcl) .Marland Kitchen.. 1 or 2 Tabs Two Times A Day As Needed 6)  Nasonex 50 Mcg/act Susp (Mometasone Furoate) .Marland Kitchen.. 1-2 Sprays in Each Nostril As Needed 7)  Clarinex 5 Mg Tabs (Desloratadine) .Marland Kitchen.. 1 By Mouth Once Daily As Needed 8)  Proair Hfa 108 (90 Base) Mcg/act Aers (Albuterol Sulfate) 9)  Hydrocortisone Ace-Pramoxine 2.5-1 % Crea (Hydrocortisone Ace-Pramoxine) .... Use As Directed. 10)  Neurontin 100 Mg Caps (Gabapentin) .... Three Times A Day 11)  Cymbalta 60 Mg Cpep (Duloxetine Hcl) .... Take 2 By Mouth Once Daily 12)  Nitrostat 0.4 Mg Subl (Nitroglycerin) .Marland Kitchen.. 1 Tablet Under Tongue At Onset of Chest Pain; You May Repeat Every 5 Minutes For Up To 3 Doses. 13)  Aspirin 81 Mg Tbec (Aspirin) .... By Mouth Once Daily 14)  Crestor 10 Mg Tabs (Rosuvastatin Calcium) .Marland KitchenMarland KitchenMarland Kitchen  1 By Mouth At Bedtime 15)  Zegerid 40-1100 Mg Caps (Omeprazole-Sodium Bicarbonate) .Marland Kitchen.. 1 By Mouth At Bedtime 16)  Kristalose 10 Gm Pack (Lactulose) .... Take 15cc Two Times A Day 17)  Zostavax 16109 Unt/0.68ml Solr (Zoster Vaccine Live) .Marland Kitchen.. 1 Ml Im X1 18)  Norvasc 5 Mg Tabs (Amlodipine Besylate) .... Take One Tablet By Mouth Daily  Current Medications (verified): 1)  Hydrochlorothiazide 25 Mg Tabs (Hydrochlorothiazide) .Marland Kitchen.. 1 By Mouth Daily. 2)  Atenolol 50 Mg Tabs (Atenolol) .... 1/2 Tablet By Mouth Two Times A Day 3)  Premarin 0.9 Mg  Tabs (Estrogens Conjugated) .Marland Kitchen.. 1 Tab Once Daily 4)  Trazodone Hcl 100 Mg Tabs (Trazodone Hcl) .Marland Kitchen.. 1 Tab Hs. 5)  Tramadol Hcl 50 Mg Tabs (Tramadol Hcl) .Marland Kitchen.. 1 or 2 Tabs Two Times A Day As Needed 6)  Nasonex 50 Mcg/act Susp (Mometasone Furoate) .Marland Kitchen.. 1-2 Sprays in Each Nostril As Needed 7)  Clarinex 5 Mg Tabs (Desloratadine) .Marland Kitchen.. 1 By Mouth Once Daily As Needed 8)  Proair Hfa 108 (90 Base) Mcg/act Aers (Albuterol Sulfate) 9)  Hydrocortisone Ace-Pramoxine 2.5-1 % Crea (Hydrocortisone Ace-Pramoxine) .... Use As Directed. 10)  Neurontin 100 Mg Caps (Gabapentin) .... Three Times A Day 11)  Cymbalta 60 Mg Cpep (Duloxetine Hcl) .... Take 2 By Mouth Once Daily 12)  Nitrostat 0.4 Mg Subl (Nitroglycerin) .Marland Kitchen.. 1 Tablet Under Tongue At Onset of Chest Pain; You May Repeat Every 5 Minutes For Up To 3 Doses. 13)  Aspirin 81 Mg Tbec (Aspirin) .... By Mouth Once Daily 14)  Crestor 10 Mg Tabs (Rosuvastatin Calcium) .Marland Kitchen.. 1 By Mouth At Bedtime 15)  Zegerid 40-1100 Mg Caps (Omeprazole-Sodium Bicarbonate) .Marland Kitchen.. 1 By Mouth At Bedtime 16)  Kristalose 10 Gm Pack (Lactulose) .... Take 15cc Two Times A Day 17)  Zostavax 60454 Unt/0.43ml Solr (Zoster Vaccine Live) .Marland Kitchen.. 1 Ml Im X1 18)  Norvasc 5 Mg Tabs (Amlodipine Besylate) .... Take One Tablet By Mouth Daily 19)  [facility] Adult Arm Sling/adjustable  Misc (Misc. Devices) .... As Directed 20)  Zithromax Z-Pak 250 Mg Tabs (Azithromycin) .... As Directed  Allergies (verified): 1)  ! Pcn 2)  ! Talwin 3)  ! Codeine 4)  ! * Myocins 5)  ! Pneumovax 23  Past History:  Past Medical History: Last updated: 02/23/2011 Arthritis Asthma Skin cancer, hx of Chronic Bronchitis Frequent Headaches GERD Heart Arrythmia-Irregular heartbeat HTN Migraines  Polyps in colon Hypothyroidism Mild non-obstructive CAD  Past Surgical History: Last updated: 09/13/2010 Breast Biopsy Appendectomy Hysterectomy Tonsillectomy Skin cancer removal Vocal cord cyst removal Sinus Surgery  1980's Repair of right ankle 1996  Family History: Last updated: 09/19/2010 Rectal Cancer: Mother  Heart disease Family History of Arthritis Family History Breast cancer 1st degree relative <50 Family History Diabetes 1st degree relative Family History High cholesterol Family History Hypertension Family History Lung cancer Family History of Sudden Death Family History Osteoporosis Father-deceased, CHF/CAD age 46 Mother-alive, healthy 1 sister-alive and healthy Family History of Irritable Bowel Syndrome: All her 3 childern  Social History: Last updated: 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 Rectal Cancer: Mother  Heart disease Family History of Arthritis Family History Breast cancer 1st degree relative <50 Family History Diabetes 1st  degree relative Family History High cholesterol Family History Hypertension Family History Lung cancer Family History of Sudden Death Family History Osteoporosis Father-deceased, CHF/CAD age 89 Mother-alive, healthy 1 sister-alive and healthy Family History of Irritable Bowel Syndrome: All her 3 childern  Social History: Reviewed history from 09/19/2010 and no changes required. Retired-nurse Married Childern Former tobacco abuse-stopped August 2011 (Total of 15 years, 1ppd) Alcohol use-yes Drug use-no Regular exercise-no Daily Caffeine Use: coffee  Review of Systems      See HPI   Impression & Recommendations:  Problem # 1:  SINUSITIS - ACUTE-NOS (ICD-461.9)  Her updated medication list for this problem includes:     Nasonex 50 Mcg/act Susp (Mometasone furoate) .Marland Kitchen... 1-2 sprays in each nostril as needed    Zithromax Z-pak 250 Mg Tabs (Azithromycin) .Marland Kitchen... As directed  Instructed on treatment. Call if symptoms persist or worsen.   Problem # 2:  ARM PAIN, LEFT (ICD-729.5)  Orders: Orthopedic Surgeon Referral (Ortho Surgeon)  Complete Medication List: 1)  Hydrochlorothiazide 25 Mg Tabs (Hydrochlorothiazide) .Marland Kitchen.. 1 by mouth daily. 2)  Atenolol 50 Mg Tabs (Atenolol) .... 1/2 tablet by mouth two times a day 3)  Premarin 0.9 Mg Tabs (Estrogens conjugated) .Marland Kitchen.. 1 tab once daily 4)  Trazodone Hcl 100 Mg Tabs (Trazodone hcl) .Marland Kitchen.. 1 tab hs. 5)  Tramadol Hcl 50 Mg Tabs (Tramadol hcl) .Marland Kitchen.. 1 or 2 tabs two times a day as needed 6)  Nasonex 50 Mcg/act Susp (Mometasone furoate) .Marland Kitchen.. 1-2 sprays in each nostril as needed 7)  Clarinex 5 Mg Tabs (Desloratadine) .Marland Kitchen.. 1 by mouth once daily as needed 8)  Proair Hfa 108 (90 Base) Mcg/act Aers (Albuterol sulfate) 9)  Hydrocortisone Ace-pramoxine 2.5-1 % Crea (Hydrocortisone ace-pramoxine) .... Use as directed. 10)  Neurontin 100 Mg Caps (Gabapentin) .... Three times a day 11)  Cymbalta 60 Mg Cpep (Duloxetine hcl) .... Take 2 by mouth once daily 12)  Nitrostat 0.4 Mg Subl (Nitroglycerin) .Marland Kitchen.. 1 tablet under tongue at onset of chest pain; you may repeat every 5 minutes for up to 3 doses. 13)  Aspirin 81 Mg Tbec (Aspirin) .... By mouth once daily 14)  Crestor 10 Mg Tabs (Rosuvastatin calcium) .Marland Kitchen.. 1 by mouth at bedtime 15)  Zegerid 40-1100 Mg Caps (Omeprazole-sodium bicarbonate) .Marland Kitchen.. 1 by mouth at bedtime 16)  Kristalose 10 Gm Pack (Lactulose) .... Take 15cc two times a day 17)  Zostavax 81191 Unt/0.72ml Solr (Zoster vaccine live) .Marland Kitchen.. 1 ml im x1 18)  Norvasc 5 Mg Tabs (Amlodipine besylate) .... Take one tablet by mouth daily 19)  [facility] Adult Arm Sling/adjustable Misc (Misc. devices) .... As directed 20)  Zithromax Z-pak 250 Mg Tabs (Azithromycin) .... As  directed Prescriptions: ZITHROMAX Z-PAK 250 MG TABS (AZITHROMYCIN) as directed  #2 x 0   Entered and Authorized by:   Loreen Freud DO   Signed by:   Loreen Freud DO on 02/24/2011   Method used:   [facility]ically to        [facility] [phone][facility] (retail)       [address] [place], [place]  [address]       Ph: [phone] or [phone]       Fax: [phone]   RxID:   [id number] [facility] ADULT ARM SLING/ADJUSTABLE  MISC (MISC. DEVICES) as directed  #1 x 0   Entered and Authorized by:   Loreen Freud DO   Signed by:   Loreen Freud DO on 02/24/2011  Method used:   Electronically to        [facility] [phone]* (retail)       [address]       [place], [place]  [address]       Ph: [phone] or [phone]       Fax: [phone]   RxID:   [id number] CLARINEX 5 MG TABS (DESLORATADINE) 1 by mouth once daily as needed  #30 x 11   Entered and Authorized by:   Loreen Freud DO   Signed by:   Loreen Freud DO on 02/24/2011   Method used:   Electronically to        [facility] [phone][facility] (retail)       [address]       [place], [place]  [address]       Ph: [phone] or [phone]       Fax: [phone]   RxID:   [id number] NASONEX 50 MCG/ACT SUSP (MOMETASONE FUROATE) 1-2 sprays in each nostril as needed  #1 x 5   Entered and Authorized by:   Loreen Freud DO   Signed by:   Loreen Freud DO on 02/24/2011   Method used:   Electronically to        [facility] [phone]* (retail)       [address]       [place], [place]  [address]       Ph: [phone] or [phone]       Fax: [phone]   RxID:   [id number]    Orders Added: 1)  Orthopedic Surgeon Referral [Ortho Surgeon] 2)  Est. Patient Level III [[address]]

## 2011-03-02 NOTE — Letter (Signed)
Summary: Health Assessment/[employer]  Health Assessment/[employer]   Imported By: Lanelle Bal 02/20/2011 14:17:31  _____________________________________________________________________  External Attachment:    Type:   Image     Comment:   External Document

## 2011-03-02 NOTE — Assessment & Plan Note (Signed)
Summary: [name]   Visit Type:  Follow-up Referring Provider:  Loreen Freud, DO Primary Provider:  Loreen Freud, DO  CC:  shortness of breath and fatigued.  History of Present Illness: 65 yo WF with history of asthma, GERD, HTN and hypothyroidism who was admitted to [facility] August 05, 2010 with exertional chest pain and dyspnea. Her EKG showed inferolateral ST segment depression. She was admitted and underwent cardiac cath which showed mild non-obstructive disease. Her LVEF was normal. D-dimer was normal. Cardiac enzymes were negative.  I saw her in my clinic last in September 2011. She has been doing well. No chest pain. She has occasional SOB. Overall she feels well. She does describe a strange pain in her left arm. Occurs at rest. Localized over one spot. Worsened with movement of the arm.    Current Medications (verified): 1)  Hydrochlorothiazide 25 Mg Tabs (Hydrochlorothiazide) .Marland Kitchen.. 1 By Mouth Daily. 2)  Atenolol 50 Mg Tabs (Atenolol) .... 1/2 Tablet By Mouth Two Times A Day 3)  Premarin 0.9 Mg Tabs (Estrogens Conjugated) .Marland Kitchen.. 1 Tab Once Daily 4)  Trazodone Hcl 100 Mg Tabs (Trazodone Hcl) .Marland Kitchen.. 1 Tab Hs. 5)  Tramadol Hcl 50 Mg Tabs (Tramadol Hcl) .Marland Kitchen.. 1 or 2 Tabs Two Times A Day As Needed 6)  Nasonex 50 Mcg/act Susp (Mometasone Furoate) .Marland Kitchen.. 1-2 Sprays in Each Nostril As Needed 7)  Clarinex 5 Mg Tabs (Desloratadine) .Marland Kitchen.. 1 By Mouth Once Daily As Needed 8)  Proair Hfa 108 (90 Base) Mcg/act Aers (Albuterol Sulfate) 9)  Hydrocortisone Ace-Pramoxine 2.5-1 % Crea (Hydrocortisone Ace-Pramoxine) .... Use As Directed. 10)  Neurontin 100 Mg Caps (Gabapentin) .... Three Times A Day 11)  Cymbalta 60 Mg Cpep (Duloxetine Hcl) .... Take 2 By Mouth Once Daily 12)  Nitrostat 0.4 Mg Subl (Nitroglycerin) .Marland Kitchen.. 1 Tablet Under Tongue At Onset of Chest Pain; You May Repeat Every 5 Minutes For Up To 3 Doses. 13)  Aspirin 81 Mg Tbec (Aspirin) .... By Mouth Once Daily 14)  Crestor 10 Mg Tabs  (Rosuvastatin Calcium) .Marland Kitchen.. 1 By Mouth At Bedtime 15)  Zegerid 40-1100 Mg Caps (Omeprazole-Sodium Bicarbonate) .Marland Kitchen.. 1 By Mouth At Bedtime 16)  Kristalose 10 Gm Pack (Lactulose) .... Take 15cc Two Times A Day 17)  Zostavax 69485 Unt/0.81ml Solr (Zoster Vaccine Live) .Marland Kitchen.. 1 Ml Im X1  Allergies (verified): 1)  ! Pcn 2)  ! Talwin 3)  ! Codeine 4)  ! * Myocins 5)  ! Pneumovax 23  Past History:  Past Medical History: Arthritis Asthma Skin cancer, hx of Chronic Bronchitis Frequent Headaches GERD Heart Arrythmia-Irregular heartbeat HTN Migraines  Polyps in colon Hypothyroidism Mild non-obstructive CAD  Social History: Reviewed history from 09/19/2010 and no changes required. Retired-nurse Married Childern Former tobacco abuse-stopped August 2011 (Total of 15 years, 1ppd) Alcohol use-yes Drug use-no Regular exercise-no Daily Caffeine Use: coffee  Review of Systems       The patient complains of shortness of breath.  The patient denies fatigue, malaise, fever, weight gain/loss, vision loss, decreased hearing, hoarseness, chest pain, palpitations, prolonged cough, wheezing, sleep apnea, coughing up blood, abdominal pain, blood in stool, nausea, vomiting, diarrhea, heartburn, incontinence, blood in urine, muscle weakness, joint pain, leg swelling, rash, skin lesions, headache, fainting, dizziness, depression, anxiety, enlarged lymph nodes, easy bruising or bleeding, and environmental allergies.    Vital Signs:  Patient profile:   65 year old female Height:      64 inches Weight:      114.50 pounds BMI:  19.72 Pulse rate:   66 / minute BP sitting:   154 / 76  (left arm) Cuff size:   regular  Vitals Entered By: Caralee Ates CMA (February 23, 2011 2:16 PM)  Physical Exam  General:  General: Well developed, well nourished, NAD Musculoskeletal: Muscle strength 5/5 all ext Psychiatric: Mood and affect normal Neck: No JVD, no carotid bruits, no thyromegaly, no  lymphadenopathy. Lungs:Clear bilaterally, no wheezes, rhonci, crackles CV: RRR no murmurs, gallops rubs Abdomen: soft, NT, ND, BS present Extremities: No edema, pulses 2+.    EKG  Procedure date:  02/23/2011  Findings:      NSR, rate 61 bpm. Non-specific ST and T wave abnormalities-unchanged.   Impression & Recommendations:  Problem # 1:  CAD, NATIVE VESSEL (ICD-414.01) Stable. No chest pain. She has mild CAD. Will continue ASA, statin and beta blocker.   Her updated medication list for this problem includes:    Atenolol 50 Mg Tabs (Atenolol) .Marland Kitchen... 1/2 tablet by mouth two times a day    Nitrostat 0.4 Mg Subl (Nitroglycerin) .Marland Kitchen... 1 tablet under tongue at onset of chest pain; you may repeat every 5 minutes for up to 3 doses.    Aspirin 81 Mg Tbec (Aspirin) ..... By mouth once daily    Norvasc 5 Mg Tabs (Amlodipine besylate) .Marland Kitchen... Take one tablet by mouth daily  Problem # 2:  HYPERTENSION (ICD-401.9) Uncontrolled. Will add Norvasc 5 mg by mouth Qdaily.   Her updated medication list for this problem includes:    Hydrochlorothiazide 25 Mg Tabs (Hydrochlorothiazide) .Marland Kitchen... 1 by mouth daily.    Atenolol 50 Mg Tabs (Atenolol) .Marland Kitchen... 1/2 tablet by mouth two times a day    Aspirin 81 Mg Tbec (Aspirin) ..... By mouth once daily    Norvasc 5 Mg Tabs (Amlodipine besylate) .Marland Kitchen... Take one tablet by mouth daily  Patient Instructions: 1)  Your physician recommends that you schedule a follow-up appointment in: 1 year 2)  Your physician has recommended you make the following change in your medication: START NORVASC 5mg  by mouth daily Prescriptions: NORVASC 5 MG TABS (AMLODIPINE BESYLATE) take one tablet by mouth daily  #30 x 14   Entered by:   Whitney Maeola Sarah RN   Authorized by:   Verne Carrow, MD   Signed by:   Ellender Hose RN on 02/23/2011   Method used:   [facility]ron[facility] to        [facility] [phone] (retail)       [address] [place], [place]  [address]        Ph: [phone] or [phone]       Fax: [phone]   RxID:   [id number]

## 2011-03-06 ENCOUNTER — Encounter (HOSPITAL_COMMUNITY): Payer: Medicare Other

## 2011-03-06 ENCOUNTER — Telehealth: Payer: Self-pay | Admitting: Family Medicine

## 2011-03-07 NOTE — Op Note (Signed)
Summary: Reclast Infusion Orders  Reclast Infusion Orders   Imported By: Maryln Gottron 03/03/2011 13:54:28  _____________________________________________________________________  External Attachment:    Type:   Image     Comment:   External Document

## 2011-03-10 LAB — LIPID PANEL
Total CHOL/HDL Ratio: 3 RATIO
Triglycerides: 54 mg/dL (ref <150)
VLDL: 11 mg/dL (ref 0–40)

## 2011-03-10 LAB — BASIC METABOLIC PANEL
BUN: 9 mg/dL (ref 6–23)
Calcium: 8.7 mg/dL (ref 8.4–10.5)
Chloride: 99 mEq/L (ref 96–112)
Creatinine, Ser: 0.7 mg/dL (ref 0.4–1.2)
GFR calc Af Amer: >60 mL/min (ref >60)
GFR calc non Af Amer: >60 mL/min (ref >60)
Potassium: 3.4 mEq/L — ABNORMAL LOW (ref 3.5–5.1)
Sodium: 137 mEq/L (ref 135–145)

## 2011-03-10 LAB — CBC
HCT: 39.4 % (ref 36.0–46.0)
Hemoglobin: 12.2 g/dL (ref 12.0–15.0)
MCH: 31 pg (ref 26.0–34.0)
MCH: 31.1 pg (ref 26.0–34.0)
MCV: 91.4 fL (ref 78.0–100.0)
Platelets: 220 10*3/uL (ref 150–400)
Platelets: 254 10*3/uL (ref 150–400)
RBC: 3.93 MIL/uL (ref 3.87–5.11)
RDW: 13.1 % (ref 11.5–15.5)
WBC: 5.4 10*3/uL (ref 4.0–10.5)
WBC: 6.5 10*3/uL (ref 4.0–10.5)

## 2011-03-10 LAB — HEPATIC FUNCTION PANEL
AST: 20 U/L (ref 0–37)
Albumin: 3.1 g/dL — ABNORMAL LOW (ref 3.5–5.2)
Alkaline Phosphatase: 69 U/L (ref 39–117)
Total Bilirubin: 0.4 mg/dL (ref 0.3–1.2)

## 2011-03-10 LAB — POCT I-STAT, CHEM 8
Creatinine, Ser: 0.9 mg/dL (ref 0.4–1.2)
Glucose, Bld: 83 mg/dL (ref 70–99)
Hemoglobin: 13.9 g/dL (ref 12.0–15.0)
Sodium: 138 mEq/L (ref 135–145)
TCO2: 32 mmol/L (ref 0–100)

## 2011-03-10 LAB — HEPARIN LEVEL (UNFRACTIONATED): Heparin Unfractionated: 0.96 IU/mL — ABNORMAL HIGH (ref 0.30–0.70)

## 2011-03-10 LAB — PROTIME-INR
INR: 1.05 (ref 0.00–1.49)
Prothrombin Time: 13.9 seconds (ref 11.6–15.2)

## 2011-03-10 LAB — DIFFERENTIAL
Basophils Absolute: 0 10*3/uL (ref 0.0–0.1)
Basophils Relative: 1 % (ref 0–1)
Eosinophils Absolute: 0.1 10*3/uL (ref 0.0–0.7)
Eosinophils Relative: 1 % (ref 0–5)
Lymphocytes Relative: 26 % (ref 12–46)
Monocytes Absolute: 0.5 10*3/uL (ref 0.1–1.0)
Neutrophils Relative %: 66 % (ref 43–77)

## 2011-03-10 LAB — CARDIAC PANEL(CRET KIN+CKTOT+MB+TROPI)
Relative Index: INVALID (ref 0.0–2.5)
Total CK: 24 U/L (ref 7–177)
Troponin I: 0.01 ng/mL (ref 0.00–0.06)

## 2011-03-10 LAB — TSH: TSH: 1.078 u[IU]/mL (ref 0.350–4.500)

## 2011-03-10 LAB — D-DIMER, QUANTITATIVE: D-Dimer, Quant: <0.22 ug/mL-FEU (ref 0.00–0.48)

## 2011-03-14 ENCOUNTER — Other Ambulatory Visit: Payer: Self-pay | Admitting: Family Medicine

## 2011-03-14 NOTE — Progress Notes (Signed)
Summary: med not covered  Phone Note Refill Request   Refills Requested: Medication #1:  CLARINEX 5 MG TABS 1 by mouth once daily as needed This medication is not covered under Patient plan. Pls advise if PA is needed or if alternative drug is a option(claritin, zyrtec,CHLOR-TRIMETON,ALAVERT,BENADRYL . Pls advise......Marland KitchenFelecia Deloach CMA  March 06, 2011 2:42 PM         Follow-up for Phone Call        ask pt to try one of otc first. Follow-up by: Loreen Freud DO,  March 06, 2011 3:10 PM  Additional Follow-up for Phone Call Additional follow up Details #1::        spoke with patient and she is aware of the above and she agreed.... Almeta Monas CMA Duncan Dull)  March 06, 2011 4:11 PM

## 2011-03-16 ENCOUNTER — Encounter (HOSPITAL_COMMUNITY): Payer: Medicare Other

## 2011-03-23 ENCOUNTER — Encounter (HOSPITAL_COMMUNITY): Payer: Medicare Other

## 2011-03-23 ENCOUNTER — Telehealth: Payer: Self-pay | Admitting: *Deleted

## 2011-03-23 NOTE — Telephone Encounter (Signed)
Pt was schedule for reclast infusion today but no show. Please contact Pt to reschedule. Pt labs will expire on Tuesday.

## 2011-03-23 NOTE — Telephone Encounter (Signed)
Left message to call office

## 2011-03-27 NOTE — Telephone Encounter (Signed)
Left message to call office

## 2011-03-27 NOTE — Telephone Encounter (Signed)
Pt aware labs scheduled, will reschedule Reclast once results are in

## 2011-03-28 ENCOUNTER — Other Ambulatory Visit (INDEPENDENT_AMBULATORY_CARE_PROVIDER_SITE_OTHER): Payer: Medicare Other

## 2011-03-28 DIAGNOSIS — M810 Age-related osteoporosis without current pathological fracture: Secondary | ICD-10-CM

## 2011-03-29 ENCOUNTER — Encounter: Payer: Self-pay | Admitting: *Deleted

## 2011-03-29 LAB — CALCIUM: Calcium: 9.4 mg/dL (ref 8.4–10.5)

## 2011-03-29 LAB — CREATININE, SERUM: Creatinine, Ser: 1 mg/dL (ref 0.4–1.2)

## 2011-04-03 ENCOUNTER — Encounter: Payer: Self-pay | Admitting: Gastroenterology

## 2011-04-03 ENCOUNTER — Ambulatory Visit (INDEPENDENT_AMBULATORY_CARE_PROVIDER_SITE_OTHER): Payer: Medicare Other | Admitting: Gastroenterology

## 2011-04-03 VITALS — BP 102/80 | HR 76 | Ht 65.0 in | Wt 115.4 lb

## 2011-04-03 DIAGNOSIS — K219 Gastro-esophageal reflux disease without esophagitis: Secondary | ICD-10-CM

## 2011-04-03 DIAGNOSIS — R1310 Dysphagia, unspecified: Secondary | ICD-10-CM

## 2011-04-03 NOTE — Assessment & Plan Note (Addendum)
She has persistent symptoms of regurgitation of gastric contents.  Recommendations #1 gastric emptying scan. #2 Gaviscon prn;  Increase to  twice a day Zegerid. #3 antireflux measures, including n.p.o. for several hours prior to bedtime

## 2011-04-03 NOTE — Progress Notes (Signed)
History of Present Illness:  [name] has returned for followup of her dyspepsia and GERD. Pyrosis is fairly well-controlled. Her main complaint is frequent regurgitation of gastric contents, especially at night. She will awaken with a sore throat and foam in her mouth. During the day she may regurgitate if she bends over. She takes Zegerid one to 2 times a day. She is not a late night eater.  She denies dysphagia.    Review of Systems: Pertinent positive and negative review of systems were noted in the above HPI section. All other review of systems were otherwise negative.    Current Medications, Allergies, Past Medical History, Past Surgical History, Family History and Social History were reviewed in [facility] electronic medical record  Vital signs were reviewed in today's medical record. Physical Exam: General: Well developed , well nourished, no acute distress  Musculoskeletal: Symmetrical with no gross deformities  Pulses:  Normal pulses noted Extremities: No clubbing, cyanosis, edema or deformities noted Neurological: Alert oriented x 4, grossly nonfocal Psychological:  Alert and cooperative. Normal mood and affect

## 2011-04-03 NOTE — Assessment & Plan Note (Signed)
Current currently asymptomatic

## 2011-04-03 NOTE — Patient Instructions (Addendum)
Try gaviscon before retiring in addition to your other medications  Acid Reflux (GERD) Acid reflux is also called gastroesophageal reflux disease (GERD). Your stomach makes acid to help digest food. Acid reflux happens when acid from your stomach goes into the tube between your mouth and stomach (esophagus). Your stomach is protected from the acid, but this tube is not. When acid gets into the tube, it may cause a burning feeling in the chest (heartburn). Besides heartburn, other health problems can happen if the acid keeps going into the tube. Some causes of acid reflux include:  Being overweight.   Smoking.   Drinking alcohol.   Eating large meals.   Eating meals and then going to bed right away.   Eating certain foods.   Increased stomach acid production.  HOME CARE  Take all medicine as told by your doctor.   You may need to:   Lose weight.   Avoid alcohol.   Quit smoking.   Do not eat big meals. It is better to eat smaller meals throughout the day.   Do not eat a meal and then nap or go to bed.   Sleep with your head higher than your stomach.   Avoid foods that bother you.   You may need more tests, or you may need to see a special doctor.  GET HELP RIGHT AWAY IF:  You have chest pain that is different than before.   You have pain that goes to your arms, jaw, or between your shoulder blades.   You throw up (vomit) blood, dark brown liquid, or your throw up looks like coffee grounds.   You have trouble swallowing.   You have trouble breathing or cannot stop coughing.   You feel dizzy or pass out.   Your skin is cool, wet, and pale.   Your medicine is not helping.  MAKE SURE YOU:   Understand these instructions.   Will watch your condition.   Will get help right away if you are not doing well or get worse.  Document Released: 05/29/2008 Document Re-Released: 03/07/2010 [facility] Patient Information 2011 [place], [place].  Your Gastric Emptying Scan  is scheduled on 04/19/2011 at 10am at [facility],Please arrive 15 minutes early Nothing to eat or drink after midnight No Zegerid or any other stomach medications 24 hours prior to test

## 2011-04-17 ENCOUNTER — Encounter: Payer: Self-pay | Admitting: Family Medicine

## 2011-04-17 DIAGNOSIS — M810 Age-related osteoporosis without current pathological fracture: Secondary | ICD-10-CM | POA: Insufficient documentation

## 2011-04-19 ENCOUNTER — Other Ambulatory Visit: Payer: Self-pay | Admitting: *Deleted

## 2011-04-19 ENCOUNTER — Encounter (HOSPITAL_COMMUNITY)
Admission: RE | Admit: 2011-04-19 | Discharge: 2011-04-19 | Disposition: A | Discharge status: Home / Self Care | Payer: Medicare Other | Source: Ambulatory Visit | Attending: Gastroenterology | Admitting: Gastroenterology

## 2011-04-19 ENCOUNTER — Encounter (HOSPITAL_COMMUNITY): Payer: Self-pay

## 2011-04-19 DIAGNOSIS — R109 Unspecified abdominal pain: Secondary | ICD-10-CM | POA: Insufficient documentation

## 2011-04-19 DIAGNOSIS — R142 Eructation: Secondary | ICD-10-CM | POA: Insufficient documentation

## 2011-04-19 DIAGNOSIS — R143 Flatulence: Secondary | ICD-10-CM | POA: Insufficient documentation

## 2011-04-19 DIAGNOSIS — K219 Gastro-esophageal reflux disease without esophagitis: Secondary | ICD-10-CM | POA: Insufficient documentation

## 2011-04-19 DIAGNOSIS — R141 Gas pain: Secondary | ICD-10-CM | POA: Insufficient documentation

## 2011-04-19 MED ORDER — TECHNETIUM TC 99M SULFUR COLLOID
2.1000 | Freq: Once | INTRAVENOUS | Status: AC | PRN
  Administered 2011-04-19: 2.1 via ORAL

## 2011-04-19 MED ORDER — ALBUTEROL SULFATE HFA 108 (90 BASE) MCG/ACT IN AERS: INHALATION_SPRAY | RESPIRATORY_TRACT | Status: DC

## 2011-04-20 ENCOUNTER — Other Ambulatory Visit: Payer: Self-pay | Admitting: Family Medicine

## 2011-04-24 ENCOUNTER — Telehealth: Payer: Self-pay | Admitting: *Deleted

## 2011-04-24 ENCOUNTER — Ambulatory Visit (INDEPENDENT_AMBULATORY_CARE_PROVIDER_SITE_OTHER): Payer: Medicare Other | Admitting: Family Medicine

## 2011-04-24 VITALS — BP 114/70 | HR 98 | Temp 98.6°F | Wt 114.0 lb

## 2011-04-24 DIAGNOSIS — J019 Acute sinusitis, unspecified: Secondary | ICD-10-CM

## 2011-04-24 MED ORDER — AZITHROMYCIN 250 MG PO TABS: 250.0000 mg | ORAL_TABLET | Freq: Every day | ORAL | Status: AC

## 2011-04-24 NOTE — Patient Instructions (Signed)
Take the Zpack as directed for your sinus infection- if it hasn't cleared after 1 round, take the 2nd pack Use the nasonex daily Add the albuterol inhaler as needed for shortness of breath Drink plenty of fluids Tylenol/Ibuprofen as needed for pain or fever Hang in there! Enjoy your vacation!

## 2011-04-24 NOTE — Telephone Encounter (Signed)
CALLED PT TO INFORM TEST RESULTS, AND TO CALL BACK TO SCHEDULE A FOLLOW UP APPOINTMENT TO SEE DR Arlyce Dice

## 2011-04-24 NOTE — Telephone Encounter (Signed)
Message copied by Merri Ray on Mon Apr 24, 2011  1:37 PM ------      Message from: Melvia Heaps MD      Created: Mon Apr 24, 2011  5:18 AM       ges normal.       neds ov

## 2011-04-24 NOTE — Assessment & Plan Note (Signed)
Pt's PE and sxs consistent w/ infxn.  Start Zpack at pt's request.  Reviewed supportive care and red flags that should prompt return.  Pt expressed understanding and is in agreement w/ plan.

## 2011-04-24 NOTE — Progress Notes (Signed)
  Subjective:    Patient ID: [name], female    DOB: 06-25-1946, 65 y.o.   MRN: [id number]  HPI Shortness of breath- first started 10 days ago after running out of nasal spray.  Was having difficulty breathing while climbing stairs.  Got ProAir refill on Wednesday w/ some relief of SOB.  Still having facial pain, tooth pain, hoarseness, nasal congestion, sore throat, some ear fullness.  Reports increased wheezing.  Hx of recurrent sinus infxns.  Pt w/ multiple med allergies- reports zpacks work well but it typically takes 2 rounds.   Review of Systems For ROS see HPI     Objective:   Physical Exam  Constitutional: She appears well-developed and well-nourished. No distress.  HENT:  Head: Normocephalic and atraumatic.  Right Ear: Tympanic membrane normal.  Left Ear: Tympanic membrane normal.  Nose: Mucosal edema and rhinorrhea present. Right sinus exhibits maxillary sinus tenderness and frontal sinus tenderness. Left sinus exhibits maxillary sinus tenderness and frontal sinus tenderness.  Mouth/Throat: Uvula is midline and mucous membranes are normal. Posterior oropharyngeal erythema present. No oropharyngeal exudate.  Eyes: Conjunctivae and EOM are normal. Pupils are equal, round, and reactive to light.  Neck: Normal range of motion. Neck supple.  Cardiovascular: Normal rate, regular rhythm and normal heart sounds.   Pulmonary/Chest: Effort normal and breath sounds normal. No respiratory distress. She has no wheezes.  Lymphadenopathy:    She has cervical adenopathy.          Assessment & Plan:

## 2011-05-09 NOTE — Cardiovascular Report (Signed)
[name], [name]                ACCOUNT NO.:  [id number]   MEDICAL RECORD NO.:  [id number]          PATIENT TYPE:  INP   LOCATION:  4704                         FACILITY:  [facility]   PHYSICIAN:  Arturo Morton. Riley Kill, MD, FACCDATE OF BIRTH:  18-Jan-1946   DATE OF PROCEDURE:  DATE OF DISCHARGE:  09/13/2007                            CARDIAC CATHETERIZATION   INDICATIONS:  [name] is a very pleasant female who presents with  chest pain.  She has had prior myocardial perfusion imaging study which  was reported as abnormal.  The current study was done to assess coronary  anatomy.   PROCEDURES:  1. Left heart catheterization.  2. Selective coronary arteriography.  3. Selective left ventriculography.  4. Distal aortography without runoff.   DESCRIPTION OF PROCEDURE:  The patient was brought to the  catheterization laboratory, prepped and draped in usual fashion. Through  an anterior puncture, the femoral artery was entered, and 5-French  sheath was placed.  Views of left and right coronary arteries were  obtained in multiple angiographic projections.  Central aortic and left  ventricular pressures were measured with pigtail catheter.  Ventriculography was formed in the RAO projection.  Distal aortography  was then performed to identify the renal arteries in the setting of  hypertension.  There were no complications.  She was taken to the  holding area for manual compression.   HEMODYNAMIC DATA.:  1. Central aortic pressure 169/80, mean 119.  2. Left ventricular pressure 170/14.  3. No gradient on pullback across the aortic valve.   ANGIOGRAPHIC DATA:  1. On plain fluoroscopy, there is evidence of mild calcification of      the left coronary artery.  2. Distal aortography reveals a relatively smooth aorta with mild      distal irregularity. There are patent renal arteries but mild      luminal irregularity of the right renal artery without obvious      focal compromise.  3.  Ventriculography was done in the RAO projection.  Systolic function      is preserved.  No definite wall motion abnormalities are      identified.  4. The right coronary artery is a vessel that provides a posterior      descending and small posterolateral system.  There is 30% to at      most 40% luminal irregularity in the mid portion of the vessel just      before a right ventricular branch.  Distally, the vessel opens up,      and there is no significant focal obstruction.  5. The left main coronary artery is free of critical disease.  6. The left anterior descending artery courses to the apex.  There is      mild diffuse plaquing in the proximal left anterior descending      artery with about 20-30% area of diffuse irregularity.  The vessel      then opens up and then provides a long left anterior descending      artery which courses to the apex. There is a diagonal branch.  Other than mild luminal irregularity and eccentric calcification,      there is no significant focal obstruction.  There is mild eccentric      ostial narrowing in the LAD that does not exceed 30% luminal      reduction.  7. The circumflex system provides a small first marginal and then has      luminal irregularity followed by a moderate-size second marginal.      There is an AV posterolateral branch without significant focal      obstruction.   CONCLUSION:  1. Well-preserved left ventricular function.  2. Diffuse scattered coronary calcification and atherosclerosis      without obstruction of hemodynamic significance.   RECOMMENDATIONS:  1. Aggressive risk factor reduction.  2. Follow up in the outpatient clinic for blood pressure control and      lipid control.      Arturo Morton. Riley Kill, MD, [facility]  Electronically Signed     TDS/MEDQ  D:  10/05/2007  T:  10/06/2007  Job:  [id number]   cc:   CV Laboratory  [facility]

## 2011-05-09 NOTE — H&P (Signed)
[name], [name]                ACCOUNT NO.:  [id number]   MEDICAL RECORD NO.:  [id number]          PATIENT TYPE:  INP   LOCATION:  4704                         FACILITY:  [facility]   PHYSICIAN:  [name], M.D.DATE OF BIRTH:  Jan 25, 1946   DATE OF ADMISSION:  09/10/2007  DATE OF DISCHARGE:                              HISTORY & PHYSICAL   PRIMARY CARE PHYSICIAN:  [name], M.D.   CHIEF COMPLAINT:  Chest pain.   HISTORY OF PRESENT ILLNESS:  The patient is a 65 year old white female  with past medical history significant for hypertension, GERD, and  depression, who presents with the above complaints.  She speaks that  about 3-4 months ago she had a stress test done per Dr. Amil [name] and  following that she was told that there was ?an abnormality on the  EKG/?stress test and she was asked to follow up for a 2-week study which  she did not feel that she needed so she did not go.  She reports that on  the day prior to admission after climbing up a flight of stairs she  became short of breath and also experienced some chest pain, but that  appeared to be short-lived - resolved, and she did not think anything  further about it.  The next day which was the day of presentation, after  coming back from the grocery store and carrying her grandchild inside,  she began experiencing a chest pressure - left precordial in location,  5/10 in intensity at its worst, and she feels that for the two weeks  prior to this presentation she had been having nausea but no vomiting.  She denied any radiation.  The patient states that just before she  started having the chest pain, she had felt from numbness on her right  hand and then on her right heel and her right jaw area as well and when  she checked her blood pressure it was 160/100 and she decided to come to  the ER.  She states that she has had some chronic neck and back pain for  which she was prescribed Vicodin but she states that she only  takes  about a fourth of a tablet when she needs it because she has to take  care of her granddaughter.  She denies cough, fevers, melena,  hematochezia, and no diarrhea.  She states that she has GERD, but that  these symptoms were different from those that she usually experiences  with her GERD.  She also states that she has had jaw lice, malaise, for  about three weeks, denies dysuria.   The patient states that the chest pain lasted for about three hours  which was the time she was waiting for the ambulance to come and once  she got in the ambulance and she was given a nitroglycerin the chest  discomfort resolved, although she states that her headache worsened with  that.   PAST MEDICAL HISTORY:  As above.  Status post stress test per Dr. Amil [name] about 66-4 months ago with  ?abnormality/irregularity on EKG.   MEDICATIONS:  1. Atenolol 25 mg daily.  2. Cymbalta 50 mg daily.  3. Diovan/HCTZ 160/25 daily.  4. Flonase p.r.n.  5. Premarin 1.25 daily.  6. Prilosec 20 p.r.n.  7. Trazodone 50 mg q.h.s.   ALLERGIES:  1. PENICILLIN.  2. MACROLIDES.   SOCIAL HISTORY:  She states that she has smoked on and off for greater  than 10 years, but then she quit again about three weeks ago.  She  denies alcohol.   FAMILY HISTORY:  Her father had an MI at age 96 and one of her uncles  had an MI in his 87s.   REVIEW OF SYSTEMS:  Negative.   PHYSICAL EXAMINATION:  GENERAL:  The patient is a pleasant, elderly  white female.  She is alert and oriented, in no apparent distress.  VITAL SIGNS:  Her temperature is 97.7 with a blood pressure of 142/78,  pulse of 71, respiratory rate of 16, O2 saturation of 99%.  HEENT:  PERRL, EOMI.  Sclerae anicteric.  Moist mucous membranes.  LUNGS:  Clear to auscultation bilaterally.  No crackles and no wheezes.  CARDIOVASCULAR:  Regular rate and rhythm, normal S1/S2.  ABDOMEN:  Soft, bowel sounds present, nontender, nondistended.  No  organomegaly and no mass  palpable.  EXTREMITIES:  No cyanosis and no edema.   LABORATORY DATA:  EKG shows Q-wave inferiorly with accelerated AV  conduction.   Her white cell count is 7.7, hemoglobin 12.5, hematocrit of 37.3,  platelet count 260.  Point of care markers negative x2.  INR is 1 and  her D-dimer is less than 0.22.  LFTs are unremarkable.   ASSESSMENT/PLAN:  1. Chest pain.  As discussed above, also with EKG as above.  Will      obtain serial cardiac enzymes.  Place the patient on an aspirin and      nitrates.  Obtain a fasting lipid profile in the a.m.  I have      consulted [facility] as per patient's preference at this      time.  Will continue beta blocker and ARB, also placed patient on a      PPI to cover for possible GI etiology.  2. Hypertension.  Continue outpatient medications.  3. Gastroesophageal reflux disease - PPI as above.  4. History of depression.  Continue Cymbalta and Trazodone.      [name], M.D.  Electronically Signed     ACV/MEDQ  D:  09/11/2007  T:  09/12/2007  Job:  [id number]   cc:   [name], M.D.

## 2011-05-09 NOTE — Assessment & Plan Note (Signed)
[facility]                            CARDIOLOGY OFFICE NOTE   [name], [name]                         MRN:          [id number]  DATE:10/02/2007                            DOB:          1946/01/06    [name] is in for a follow up visit. She was admitted to the hospital. She  had a variety of issues which included energy, headaches, and some chest  pain. She was admitted to the [facility] primary care service. She was seen  in consultation by Dr. Andee Lineman. She was subsequently set up cardiac  catheterization at that time. She was also noted to be hypertensive. She  underwent a cardiac catheterization and this demonstrated mild plaquing  in the coronaries. We did do a distal aortogram and the renal arteries  appeared to be patent. There was some minor irregularity on the right  side. Otherwise, it was unremarkable. Since discharge from the hospital,  she has actually been quite a bit better. She did have a CT of the head  in the hospital which demonstrated no intracranial abnormality and  minimal fluid in the left mastoid air cells. She is also getting some  physical rehabilitation at this point in time and seems to be doing  somewhat better.   She and I reviewed her films in the office today in detail.   PHYSICAL EXAMINATION:  GENERAL:  She is alert, oriented, and in no acute  distress.  VITAL SIGNS:  Blood pressure 120/70, pulse 83.  NECK:  There are no carotid bruits.  LUNGS:  Fields are clear to auscultation and percussion.  CARDIAC:  Rhythm is regular without a significant murmur. The groin is  well healed.   The electrocardiogram demonstrates normal sinus rhythm, possible left  atrial enlargement and minor non-specific T-wave abnormalities.   Recent catheterization study is reviewed.   Echocardiogram demonstrated in the hospital overall left ventricular  systolic function that was normal. Wall thickness was normal as well.  There was trace  mitral regurgitation.   IMPRESSION:  Mild diffuse coronary plaquing without significant focal  obstruction.   RECOMMENDATIONS:  1. Follow up with primary care physician.  2. Continue efforts at blood pressure control.  3. Some consideration might be to switching Atenolol, although the      patient is on a very limited      budget.  4. She will be seen back in cardiology on a p.r.n. basis.     Arturo Morton. Riley Kill, MD, [facility]  Electronically Signed    TDS/MedQ  DD: 10/06/2007  DT: 10/06/2007  Job #: [id number]   cc:   Joycelyn Rua, M.D.

## 2011-05-09 NOTE — Discharge Summary (Signed)
[name], [name]                ACCOUNT NO.:  [id number]   MEDICAL RECORD NO.:  [id number]          PATIENT TYPE:  INP   LOCATION:  4704                         FACILITY:  [facility]   PHYSICIAN:  Michiel Cowboy, MDDATE OF BIRTH:  Jul 19, 1946   DATE OF ADMISSION:  09/10/2007  DATE OF DISCHARGE:  09/13/2007                               DISCHARGE SUMMARY   PRIMARY CARE PHYSICIAN:  Dr. Foy Guadalajara.   CONSULTATIONS:  [facility], Dr. Andee Lineman.   DISCHARGE DIAGNOSES:  1. Mild coronary artery disease, on medical management.  2. Hypertension.  3. History of mild depression.  4. Gastroesophageal reflux disease.   PROCEDURES:  1. Coronary artery catheterization done on September 12, 2007, showing      mild scattered nonobstructive coronary artery disease, normal left      ventricular function.  2. MRI of the brain done on September 13, 2007, showing unremarkable      angiography.  No acute or focal cranial abnormalities.  Noted left      mastoid fluid  of uncertain etiology.   HOSPITAL COURSE:  The patient is a 65 year old female with medical  history significant for hypertension, GERD and depression, who presented  with complaints of chest pain.  She was supposed to undergo a stress  test, but did not go for that.  On the day prior to admission, had a  recurrence of her chest pain, which is when she presented to the  emergency department.  The patient was admitted and ruled out for  myocardial ischemia.  Serial cardiac enzymes and EKGs showed no  significant abnormalities.  [facility] was consulted, and  performed coronary artery catheterization, which showed only mild  coronary artery disease.  The patient is to be discharged on medical  management only.   Of note, the patient also complained of persistent headaches that almost  happen every day.  She has been taking Vicodin for this.  She also has  had some neck pain and back pain.  MRI of the brain was performed that  showed no abnormalities.  The patient was recommended to not take pain  medications daily, as this could cause potential daily headaches.   FOLLOWUP INSTRUCTIONS:  The patient will return to her primary care  physician, Dr. Foy Guadalajara.  The patient is to return to seek immediate  medical attention  if she develops chest pain, nausea, vomiting,  shortness of breath, or weakness and numbness in any extremities.   DISCHARGE MEDICATIONS:  1. Atenolol 25 mg p.o. daily.  2. Cymbalta 50 mg p.o. daily.  3. Diovan/hydrochlorothiazide 150/25 p.o. daily.  4. Flonase 2 sprays daily.  5. Norvasc 5 mg p.o. daily; new medication.  6. Trazodone 50 mg p.o. q.h.s.  7. New medication:  Aspirin 81 mg p.o. daily.  8. The patient is to stop Premarin gradually.   Of note, the patient may benefit from outpatient visit  with neurology  concerning her progressive headaches.  We will defer this  to  Dr.  Foy Guadalajara, her primary care [name].      Michiel Cowboy, MD  Electronically Signed  AVD/MEDQ  D:  10/04/2007  T:  10/05/2007  Job:  [id number]   cc:   Molly Maduro L. Foy Guadalajara, M.D.  Learta Codding, MD,FACC

## 2011-05-09 NOTE — Consult Note (Signed)
[name], [name]                ACCOUNT NO.:  [id number]   MEDICAL RECORD NO.:  [id number]          PATIENT TYPE:  INP   LOCATION:  1831                         FACILITY:  [facility]   PHYSICIAN:  Learta Codding, MD,FACC DATE OF BIRTH:  Apr 09, 1946   DATE OF CONSULTATION:  09/10/2007  DATE OF DISCHARGE:                                 CONSULTATION   PRIMARY CARE PHYSICIAN:  Dr. Lenise Arena.   REFERRING PHYSICIAN:  Dr. Renold Don.   REASON FOR REFERRAL:  Chest pain.   HISTORY OF PRESENT ILLNESS:  [name] is a 65 year old female patient  with no known history of coronary artery disease but with a history of  abnormal Cardiolite in the past.  She actually had Cardiolite done in  [place] in 2006 that was apparently abnormal.  She was told to  follow up on this when she returned home and was referred to Dr. Amil Amen  by her primary care physician sometime in last year.  She had a  Cardiolite done with Dr. Amil Amen.  Again, this was reportedly abnormal  and she was told she needed a sleep study.  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  She presents to the emergency room today with  transient right-sided paresthesias as well as chest pain.  We are asked  by Dr. Renold Don to further evaluate.   The patient tells me that, prior to about 2 weeks ago, she was in her  usual state of health.  Since that time, she has developed profound  weakness and fatigue as well as exertional shortness of breath and  occasional chest tightness.  She has noted chest tightness going up  steps at times.  She has noted some nausea as well as diaphoresis  associated with these symptoms at times.  Today, after leaving the  store, she bent down to pick up something in noted that her right heal  was numb.  After this, she noted that her right arm was numb as well as  her right face.  She thinks that there may have been some drooping of  her right eye  but was not sure.  She does have a history of Bell's palsy  and thought that this may be related to that.  This was around 10:23  this morning.  She eventually went to her primary care physician and, in  the office, apparently received nitroglycerin.  At the time, she was  having chest tightness.  When she received nitroglycerin, all of her  symptoms resolved and this was around 3:30 p.m.  She is currently pain  free.  She denies any associated syncope but has had some  lightheadedness.  She denies any recent orthopnea, PND or pedal edema.  She does have a long history of palpitations and, apparently, is  controlled on Tenormin.   PAST MEDICAL HISTORY:  As noted above.  1. History of abnormal Cardiolite in [place] in 2006, as well      as recently with Dr. Amil Amen in [place].  a.     The patient was asked to proceed with sleep study, which she       never did.  2. Hypertension.  3. History of hypothyroidism.      a.     No current medical therapy.  4. Cervical degenerative disk disease.  5. History sinus surgery.  6. History of total abdominal hysterectomy and bilateral salpingo-      oophorectomy.  7. Status post tonsillectomy and adenoidectomy.  8. History of sinus tachycardia/palpitations.   MEDICATIONS PRIOR TO ADMISSION:  1. Atenolol 25 mg daily.  2. Cymbalta 60 mg daily.  3. Diovan/HCTZ 160/25 mg daily.  4. Flonase.  5. Norvasc daily.  6. Premarin.  7. Prilosec.  8. Trazodone 50 mg nightly.   SOCIAL HISTORY:  The patient lives in [place].  She is married and  has three children.  She recently quit smoking about 3 weeks ago.  She  essentially has about a 15-20 pack-year history.  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   FAMILY HISTORY:  Significant for coronary disease.  Her father had an MI  at age 28 and eventually died from an MI at age 51.  She also notes  several aunts and uncles have  had history of myocardial infarction.   REVIEW OF SYSTEMS:  Please see HPI.  Denies any fevers, chills, cough,  melena, hematochezia, hematuria, dysuria.  Denies any symptoms  consistent with claudication.  Rest of the review of systems are  negative.   PHYSICAL EXAMINATION:  She is a well-nourished, well-developed, female  in no distress.  Blood pressure is 137/80, pulse 67, respirations 16,  temperature 97.7, oxygen saturation 100% on room air.  HEENT:  Is normal.  NECK:  Without JVD.  LYMPHS:  No lymphadenopathy.  ENDOCRINE:  No  thyromegaly.  CAROTIDS: without bruits bilaterally.  CARDIAC:  S1, S2, regular rate and rhythm without murmurs, clicks, rubs,  or gallops.  LUNGS:  Clear to auscultation bilaterally without wheeze, rhonchi,  rales.  ABDOMEN:  Soft, nontender, with normoactive bowel sounds, no  organomegaly.  EXTREMITIES:  Without edema.  Calves are soft, nontender.  SKIN:  Are warm and dry.  NEUROLOGIC:  She is alert and oriented x3.  Cranial nerves II-XII are  grossly intact.  Femoral artery pulses, 2+ carotids bilaterally without  bruits.  Dorsalis pedis and posterior tibialis pulses 2+ bilaterally.   Chest x-rays shows no acute disease, COPD.  EKG - sinus rhythm with a  heart rate of 79.  Flattened ST-segments in II, III and AVF and V6.  Normal axis.  Shortened PR interval at 114 milliseconds.  Head CT -  nothing acute.  There is minimal fluid in the left mastoid air cells.  No obstruction.   LABORATORY DATA:  Hemoglobin 14.6, hematocrit 43, white count 7,700,  platelet count 260,000.  Sodium 136, potassium 3.6, BUN 9, creatinine 1,  glucose 78.  Point of care markers negative times 1.  INR 1, D-dimer  less than 0.22.   IMPRESSION:  1. Exertional chest pain and shortness of breath.      a.     Rule out angina.  2. Hypertension.  3. Ex-smoker.  4. Family history of coronary artery disease.  5. History of abnormal Cardiolite in the past.  6. History of  palpitations and tachycardia.  7. Transient right-sided paresthesias today.      a.     Head computerized  tomography negative.   PLAN:  The patient is also interviewed and examined by Dr. Andee Lineman.  Her  symptoms are quite concerning for unstable angina pectoris and her risk  factors appear at high risk.  Therefore, at this point in time, I plan  to:  1. Recommend cardiac catheterization to rule out obstructive coronary      artery disease.  Risks and benefits have been explained to the      patient.  She agrees to proceed.  Will plan on performing cardiac      catheterization tomorrow.  2. Continue aspirin, beta blocker, and her other medications at this      point time.  3. Check an echocardiogram.  4. Check carotid Dopplers.      Tereso Newcomer, PA-C      Learta Codding, MD,FACC  Electronically Signed    SW/MEDQ  D:  09/10/2007  T:  09/11/2007  Job:  [id number]   cc:   Joycelyn Rua, M.D.  Emmit Alexanders, M.D.

## 2011-05-10 ENCOUNTER — Other Ambulatory Visit: Payer: Self-pay | Admitting: Family Medicine

## 2011-05-10 MED ORDER — TRAZODONE HCL 100 MG PO TABS: 100.0000 mg | ORAL_TABLET | Freq: Every day | ORAL | Status: DC

## 2011-05-10 NOTE — Telephone Encounter (Signed)
Done

## 2011-05-10 NOTE — Telephone Encounter (Signed)
FYI--Pt has appt tomorrow with Tabori.

## 2011-05-11 ENCOUNTER — Ambulatory Visit (INDEPENDENT_AMBULATORY_CARE_PROVIDER_SITE_OTHER): Payer: Medicare Other | Admitting: Family Medicine

## 2011-05-11 ENCOUNTER — Ambulatory Visit: Payer: Medicare Other | Admitting: Family Medicine

## 2011-05-11 ENCOUNTER — Encounter: Payer: Self-pay | Admitting: Family Medicine

## 2011-05-11 DIAGNOSIS — J329 Chronic sinusitis, unspecified: Secondary | ICD-10-CM

## 2011-05-11 DIAGNOSIS — M810 Age-related osteoporosis without current pathological fracture: Secondary | ICD-10-CM

## 2011-05-11 LAB — CREATININE, SERUM: Creatinine, Ser: 0.7 mg/dL (ref 0.4–1.2)

## 2011-05-11 LAB — CALCIUM: Calcium: 9.1 mg/dL (ref 8.4–10.5)

## 2011-05-11 MED ORDER — BENZONATATE 200 MG PO CAPS: 200.0000 mg | ORAL_CAPSULE | Freq: Three times a day (TID) | ORAL | Status: AC | PRN

## 2011-05-11 MED ORDER — LEVOFLOXACIN 500 MG PO TABS: 500.0000 mg | ORAL_TABLET | Freq: Every day | ORAL | Status: AC

## 2011-05-11 NOTE — Patient Instructions (Signed)
You definitely have a sinus infection and likely a bronchitis Take the Levaquin as directed- take w/ food to avoid upset stomach Drink plenty of fluids Ibuprofen or tylenol as needed for pain or fever Use the cough meds as needed Hang in there!!!

## 2011-05-11 NOTE — Progress Notes (Signed)
  Subjective:    Patient ID: [name], female    DOB: 03-10-1946, 65 y.o.   MRN: [id number]  HPI URI- had sinus infxn end of April and took 2 rounds of Zpack.  2 days after finishing meds developed sore throat.  Tm 101.  Then developed nasal congestion.  sxs are worse at night.  + cough, R side of face is swollen, tender.     Review of Systems For ROS see HPI     Objective:   Physical Exam  Constitutional: She appears well-developed and well-nourished. No distress.  HENT:  Head: Normocephalic and atraumatic.  Right Ear: Tympanic membrane normal.  Left Ear: Tympanic membrane normal.  Nose: Mucosal edema and rhinorrhea present. Right sinus exhibits maxillary sinus tenderness and frontal sinus tenderness. Left sinus exhibits no maxillary sinus tenderness and no frontal sinus tenderness.  Mouth/Throat: Uvula is midline and mucous membranes are normal. Posterior oropharyngeal erythema present. No oropharyngeal exudate.  Eyes: Conjunctivae and EOM are normal. Pupils are equal, round, and reactive to light.  Neck: Normal range of motion. Neck supple.  Cardiovascular: Normal rate, regular rhythm and normal heart sounds.   Pulmonary/Chest: Effort normal and breath sounds normal. No respiratory distress. She has no wheezes.  Lymphadenopathy:    She has no cervical adenopathy.          Assessment & Plan:

## 2011-05-16 ENCOUNTER — Other Ambulatory Visit: Payer: Self-pay | Admitting: Family Medicine

## 2011-05-16 NOTE — Assessment & Plan Note (Signed)
Pt's sxs and PE consistent w/ infxn.  Given recent use of zpack w/out results and multiple allergies will start levaquin.  Reviewed supportive care and red flags that should prompt return.  Pt expressed understanding and is in agreement w/ plan.

## 2011-05-16 NOTE — Assessment & Plan Note (Signed)
Check labs for upcoming reclast infusion

## 2011-05-17 ENCOUNTER — Ambulatory Visit (HOSPITAL_COMMUNITY): Payer: Medicare Other

## 2011-05-25 ENCOUNTER — Ambulatory Visit (INDEPENDENT_AMBULATORY_CARE_PROVIDER_SITE_OTHER): Payer: Medicare Other | Admitting: Family Medicine

## 2011-05-25 VITALS — BP 100/60 | Temp 98.1°F | Wt 114.0 lb

## 2011-05-25 DIAGNOSIS — L237 Allergic contact dermatitis due to plants, except food: Secondary | ICD-10-CM

## 2011-05-25 DIAGNOSIS — L255 Unspecified contact dermatitis due to plants, except food: Secondary | ICD-10-CM

## 2011-05-25 MED ORDER — METHYLPREDNISOLONE ACETATE 80 MG/ML IJ SUSP
80.0000 mg | Freq: Once | INTRAMUSCULAR | Status: AC
  Administered 2011-05-25: 80 mg via INTRAMUSCULAR

## 2011-05-25 MED ORDER — PREDNISONE 20 MG PO TABS: ORAL_TABLET | ORAL | Status: DC

## 2011-05-25 NOTE — Progress Notes (Signed)
  Subjective:    Patient ID: [name], female    DOB: 08-Dec-1946, 65 y.o.   MRN: [id number]  HPI Poison ivy- sxs started 2 days ago w/ small area on R thigh.  Husband got poison ivy on Sunday.  Now has areas 'all over'.  Now has area 'in crease of buttock'.  Pt reports she is highly allergic.  Pt reports she has washed sheets, towels, clothes.  Has a pet doesn't feel that she has been exposed.  Areas are very itchy- taking Benadryl.   Review of Systems For ROS see HPI     Objective:   Physical Exam  Constitutional: She appears well-developed and well-nourished. No distress.  Skin:       Vesicular rash in linear pattern on R hip, abdomen, under breasts bilaterally, in gluteal crease          Assessment & Plan:

## 2011-05-25 NOTE — Assessment & Plan Note (Signed)
Pt's rash consistent w/ poison ivy.  Start steroids- depomedrol given in office.  Reviewed supportive care and red flags that should prompt return.  Pt expressed understanding and is in agreement w/ plan.

## 2011-05-25 NOTE — Patient Instructions (Signed)
Start the Prednisone tomorrow for the poison ivy The steroids will take care of the itching but you can use hydrocortisone or benadryl cream as needed Take benadryl at night for sleep and itch Call with any questions or concerns Hang in there!

## 2011-05-26 ENCOUNTER — Encounter (HOSPITAL_COMMUNITY): Payer: Medicare Other

## 2011-05-30 ENCOUNTER — Telehealth: Payer: Self-pay | Admitting: Family Medicine

## 2011-05-30 ENCOUNTER — Ambulatory Visit (HOSPITAL_COMMUNITY): Payer: Medicare Other

## 2011-05-30 MED ORDER — TRIAMCINOLONE ACETONIDE 0.1 % EX OINT: TOPICAL_OINTMENT | Freq: Two times a day (BID) | CUTANEOUS | Status: AC

## 2011-05-30 NOTE — Telephone Encounter (Signed)
Discuss with patient, Rx sent to pharmacy. 

## 2011-05-30 NOTE — Telephone Encounter (Signed)
She can stop her prednisone.  These are common side effects of the medication.  She can continue her benadryl and we can given her Triamcinolone 0.1% ointment to apply to areas bid.  1 large tube w/ 1 refill

## 2011-05-30 NOTE — Telephone Encounter (Signed)
Patient was seen by dr Beverely Low - poison ivy - she is taking prednisone she is having palpitations - increased urination - mood swing --- wants to know if she should continue taking - she still has poison ivy - [facility] - she was to get  reclast infusion toay discussed with felicia - patient was told to reschedule reclast infusion

## 2011-05-30 NOTE — Telephone Encounter (Signed)
Left message to call office

## 2011-05-30 NOTE — Telephone Encounter (Signed)
Please advise 

## 2011-06-05 ENCOUNTER — Ambulatory Visit (HOSPITAL_COMMUNITY): Payer: Medicare Other | Attending: Family Medicine

## 2011-06-05 DIAGNOSIS — M810 Age-related osteoporosis without current pathological fracture: Secondary | ICD-10-CM | POA: Insufficient documentation

## 2011-06-06 ENCOUNTER — Other Ambulatory Visit: Payer: Self-pay | Admitting: Family Medicine

## 2011-06-06 NOTE — Telephone Encounter (Signed)
Refilled 5/16. Please advise.

## 2011-06-07 NOTE — Telephone Encounter (Signed)
This is a Dr Laury Axon pt- will defer to her

## 2011-06-16 ENCOUNTER — Encounter: Payer: Self-pay | Admitting: Family Medicine

## 2011-06-16 ENCOUNTER — Encounter: Payer: Self-pay | Admitting: Family

## 2011-06-16 ENCOUNTER — Ambulatory Visit: Payer: Medicare Other | Admitting: Family

## 2011-06-16 ENCOUNTER — Ambulatory Visit (INDEPENDENT_AMBULATORY_CARE_PROVIDER_SITE_OTHER): Payer: Medicare Other | Admitting: Family

## 2011-06-16 DIAGNOSIS — N390 Urinary tract infection, site not specified: Secondary | ICD-10-CM

## 2011-06-16 DIAGNOSIS — R350 Frequency of micturition: Secondary | ICD-10-CM

## 2011-06-16 DIAGNOSIS — R3129 Other microscopic hematuria: Secondary | ICD-10-CM

## 2011-06-16 LAB — POCT URINALYSIS DIPSTICK
Glucose, UA: NEGATIVE
Nitrite, UA: NEGATIVE
Protein, UA: 100
Spec Grav, UA: 1.02
Urobilinogen, UA: 0.2
pH, UA: 6

## 2011-06-16 MED ORDER — CIPROFLOXACIN HCL 500 MG PO TABS: 500.0000 mg | ORAL_TABLET | Freq: Two times a day (BID) | ORAL | Status: AC

## 2011-06-16 MED ORDER — FLUCONAZOLE 150 MG PO TABS: ORAL_TABLET | ORAL | Status: DC

## 2011-06-16 NOTE — Progress Notes (Signed)
Subjective:    Patient ID: [name], female    DOB: August 16, 1946, 66 y.o.   MRN: [id number]  HPI  [name] is a 65 yr old female who presents today with complaint of right low back pain- started 6-7 days ago.  Noted frequency.  + pelvic tenderness, but no dysuria.  Denies fever.  Though she feels "flu like."  Has history of FM- but feels more achey than usual. Denies hematuria.   Review of Systems See HPI  Past Medical History  Diagnosis Date  . GERD (gastroesophageal reflux disease)   . Arthritis   . Asthma   . Skin cancer   . Hypertension   . Migraine   . History of colonic polyps   . Hypothyroidism   . Bronchitis     chronic  . Irregular heartbeat   . CAD (coronary artery disease)     mild non-obstructive cad    History   Social History  . Marital Status: Married    Spouse Name: N/A    Number of Children: 3  . Years of Education: N/A   Occupational History  . Retired Charity fundraiser    Social History Main Topics  . Smoking status: Former Games developer  . Smokeless tobacco: Never Used  . Alcohol Use: Yes  . Drug Use: No  . Sexually Active: Not on file   Other Topics Concern  . Not on file   Social History Narrative  . No narrative on file    Past Surgical History  Procedure Date  . Appendectomy   . Tonsillectomy   . Breast biopsy   . Skin cancer removal   . Vocal cord cyst removal   . Nasal sinus surgery   . Right ankle   . Right hand surgery   . Abdominal hysterectomy     Family History  Problem Relation Age of Onset  . Rectal cancer Mother   . Cancer Mother     rectal  . Irritable bowel syndrome Son   . Irritable bowel syndrome Daughter   . Heart disease Other   . Arthritis Other   . Cancer Other     breast cancer 1st degree relative <50, lung  . Diabetes Other     1st degree relative   . Hyperlipidemia Other   . Hypertension Other   . Osteoporosis Other   . Coronary artery disease Other 64    Allergies  Allergen Reactions  . Cleocin  Shortness Of Breath and Rash  . Prednisone Shortness Of Breath  . Codeine   . Penicillins   . Pentazocine Lactate   . Pneumococcal Vaccine Polyvalent     REACTION: redness, swelling, and hives at injection site    Current Outpatient Prescriptions on File Prior to Visit  Medication Sig Dispense Refill  . albuterol (PROAIR HFA) 108 (90 BASE) MCG/ACT inhaler As directed  1 Inhaler  0  . amLODipine (NORVASC) 5 MG tablet Take 5 mg by mouth daily.        Marland Kitchen aspirin 81 MG tablet Take 81 mg by mouth daily.        Marland Kitchen atenolol (TENORMIN) 50 MG tablet TAKE 1 TABLET TWICE A DAY  60 tablet  1  . desloratadine (CLARINEX) 5 MG tablet Take 5 mg by mouth daily.        . DULoxetine (CYMBALTA) 60 MG capsule Take 60 mg by mouth 2 (two) times daily.       Marland Kitchen estrogens, conjugated, (PREMARIN) 0.9 MG tablet Take  0.9 mg by mouth daily. Take daily for 21 days then do not take for 7 days.       Marland Kitchen gabapentin (NEURONTIN) 100 MG capsule Take 100 mg by mouth 3 (three) times daily.        . hydrochlorothiazide 25 MG tablet TAKE 1 TABLET BY MOUTH DAILY.  30 tablet  2  . mometasone (NASONEX) 50 MCG/ACT nasal spray 2 sprays by Nasal route daily.        . nitroGLYCERIN (NITROSTAT) 0.4 MG SL tablet Place 0.4 mg under the tongue every 5 (five) minutes as needed.        Marland Kitchen omeprazole-sodium bicarbonate (ZEGERID) 40-1100 MG per capsule Take 1 capsule by mouth daily before breakfast.        . rosuvastatin (CRESTOR) 10 MG tablet Take 10 mg by mouth daily.        . traMADol (ULTRAM) 50 MG tablet Take 50 mg by mouth every 6 (six) hours as needed.        . traZODone (DESYREL) 100 MG tablet Take 1 tablet (100 mg total) by mouth at bedtime.  30 tablet  0  . triamcinolone (KENALOG) 0.1 % ointment Apply topically 2 (two) times daily.  30 g  0  . DISCONTD: predniSONE (DELTASONE) 20 MG tablet 2 tabs by mouth daily x10 days.  Take w/ food.  20 tablet  0  . DISCONTD: traZODone (DESYREL) 100 MG tablet TAKE 1 TABLET AT BEDTIME  30 tablet  0     BP 120/70  Pulse 66  Temp(Src) 97.8 F (36.6 C) (Oral)  Resp 16  Wt 110 lb 1.9 oz (49.95 kg)       Objective:   Physical Exam  Constitutional: She appears well-developed and well-nourished.  Cardiovascular: Normal rate and regular rhythm.   Pulmonary/Chest: Effort normal and breath sounds normal.  Musculoskeletal:       +CVAT right.   Psychiatric: She has a normal mood and affect. Her behavior is normal. Judgment and thought content normal.          Assessment & Plan:

## 2011-06-16 NOTE — Assessment & Plan Note (Signed)
Will treat with cipro. UA shows trace blood.  I have instructed the patient to contact us if her symptoms worsen or if they do not improve.  Call if worsening low back pain, fever >101, or hematuria. Otherwise, follow up in 1 week.

## 2011-06-16 NOTE — Patient Instructions (Signed)
Please call if your symptoms worsen, if you develop fever over 101, or if you have worsening low back pain. Follow up in 1 week.

## 2011-06-18 ENCOUNTER — Other Ambulatory Visit: Payer: Self-pay | Admitting: Family Medicine

## 2011-06-18 LAB — URINE CULTURE: Organism ID, Bacteria: NO GROWTH

## 2011-07-05 ENCOUNTER — Ambulatory Visit (INDEPENDENT_AMBULATORY_CARE_PROVIDER_SITE_OTHER): Payer: Medicare Other | Admitting: Family Medicine

## 2011-07-05 ENCOUNTER — Ambulatory Visit (HOSPITAL_BASED_OUTPATIENT_CLINIC_OR_DEPARTMENT_OTHER)
Admission: RE | Admit: 2011-07-05 | Discharge: 2011-07-05 | Disposition: A | Discharge status: Home / Self Care | Payer: Medicare Other | Source: Ambulatory Visit | Attending: Family Medicine | Admitting: Family Medicine

## 2011-07-05 ENCOUNTER — Encounter: Payer: Self-pay | Admitting: Family Medicine

## 2011-07-05 VITALS — BP 112/68 | HR 63 | Temp 97.9°F | Wt 112.6 lb

## 2011-07-05 DIAGNOSIS — G4700 Insomnia, unspecified: Secondary | ICD-10-CM

## 2011-07-05 DIAGNOSIS — K7689 Other specified diseases of liver: Secondary | ICD-10-CM | POA: Insufficient documentation

## 2011-07-05 DIAGNOSIS — M545 Low back pain, unspecified: Secondary | ICD-10-CM

## 2011-07-05 DIAGNOSIS — N23 Unspecified renal colic: Secondary | ICD-10-CM

## 2011-07-05 DIAGNOSIS — R319 Hematuria, unspecified: Secondary | ICD-10-CM | POA: Insufficient documentation

## 2011-07-05 DIAGNOSIS — N390 Urinary tract infection, site not specified: Secondary | ICD-10-CM

## 2011-07-05 DIAGNOSIS — R110 Nausea: Secondary | ICD-10-CM

## 2011-07-05 DIAGNOSIS — I10 Essential (primary) hypertension: Secondary | ICD-10-CM

## 2011-07-05 DIAGNOSIS — R109 Unspecified abdominal pain: Secondary | ICD-10-CM | POA: Insufficient documentation

## 2011-07-05 LAB — POCT URINALYSIS DIPSTICK
Blood, UA: NEGATIVE
Glucose, UA: NEGATIVE
Nitrite, UA: NEGATIVE
Spec Grav, UA: 1.01
Urobilinogen, UA: 0.2

## 2011-07-05 MED ORDER — LEVOFLOXACIN 250 MG PO TABS: 250.0000 mg | ORAL_TABLET | Freq: Every day | ORAL | Status: AC

## 2011-07-05 MED ORDER — HYDROCHLOROTHIAZIDE 25 MG PO TABS: 25.0000 mg | ORAL_TABLET | Freq: Every day | ORAL | Status: DC

## 2011-07-05 MED ORDER — ATENOLOL 50 MG PO TABS: ORAL_TABLET | ORAL | Status: DC

## 2011-07-05 MED ORDER — TRAZODONE HCL 100 MG PO TABS: 100.0000 mg | ORAL_TABLET | Freq: Every day | ORAL | Status: DC

## 2011-07-05 NOTE — Patient Instructions (Signed)
Ureteral Colic (Kidney Stones) Your pain is caused by a stone located in the urinary tract system. These are the channels that collect the urine from your kidneys and deliver it to your bladder. These stones usually pass through the urinary tract and are expelled in the urine. The intense pain is caused by stone movement in those channels when the ureter goes into spasm around the stone. This pain (ureteral colic) is an intermittent pain. It comes and goes as the ureter contracts around the stone. The pain is usually located in the back and abdomen (belly). It is an intense, sharp, stabbing pain. It begins up high near the kidney and gradually moves down the ureter. When the stone is ready to pass into the bladder the pain is often located in the lower abdomen on the side the stone is located. Once the pain is located here it often passes into the bladder and disappears completely. Once the stone is in the bladder, it can pass out in the urine. This happens because the opening out of the bladder is larger than the path the stone has come down through the ureters. While the stone is passing, some relief can be obtained by pressing down on the area of pain with your hands or pressing a pillow down over the area. There is no certain position you can get in to give yourself pain relief. TREATMENT  Your caregiver will provide you with medications for pain relief.   You may require specialized follow-up x-rays. The absence of pain does not always mean that the stone has passed. It may have just stopped moving. If the urine remains completely obstructed, it can cause loss of kidney function or even complete destruction of the involved kidney. It is your responsibility and in your interest that x-rays and follow-ups as suggested by your caregiver are completed. Relief of pain without passage of the stone can be associated with severe damage to the kidney, including loss of kidney function on that side.   If your  stone does not pass on its own, additional measures may be taken by your caregiver to ensure its removal.  HOME CARE INSTRUCTIONS  Increase your fluid intake.   Strain all urine. A strainer will be provided. Keep all particulate matter or stones for your caregiver to inspect. In spite of the intense pain the stone causes it may be smaller than a BB (4.35 mm in diameter).   Take your pain medication as directed.   Make a follow-up appointment with your caregiver as directed.   Remember that the goal is passage of your stone. The absence of pain in and of itself is not the endpoint. Silent obstruction can and does happen. To avoid the serious consequences of such obstruction, it is absolutely necessary to abide by the plan to which you and your physician have agreed.  SEEK MEDICAL CARE IF:  Pain cannot be controlled with the prescribed medication.   Fever develops. Backed up urine is more likely to become infected. Only take over-the-counter or prescription medicines for pain, discomfort, or fever as directed by your caregiver.   Pain continues for more than 2-3 days.   There is a change in the pain, and you develop chest discomfort or constant abdominal pain.   You feel faint or pass out.  MAKE SURE YOU:   Understand these instructions.   Will watch your condition.   Will get help right away if you are not doing well or get worse.  Document  Released: 09/20/2005 Document Re-Released: 10/08/2009 [facility] Patient Information 2011 [place], [place].

## 2011-07-05 NOTE — Progress Notes (Signed)
  Subjective:    [name] is a 65 y.o. female who presents for evaluation of low back pain. The patient has had no prior back problems. Symptoms have been present for 1 month and are gradually worsening.  Onset was related to / precipitated by no known injury. The pain is located in the waist and radiates to the right side. The pain is described as aching and sharp and occurs intermittently. She rates her pain as severe. Symptoms are exacerbated by nothing in particular. Symptoms are improved by ultram. She has also tried nothing which provided no symptom relief. She has no other symptoms associated with the back pain. The patient has no "red flag" history indicative of complicated back pain.  The following portions of the patient's history were reviewed and updated as appropriate: allergies, current medications, past family history, past medical history, past social history, past surgical history and problem list.  Review of Systems Pertinent items are noted in HPI.    Objective:   Full range of motion without pain, no tenderness, no spasm, no curvature. Normal reflexes, gait, strength and negative straight-leg raise. Inspection and palpation: + right flank pain and suprapubic pain.    Assessment:    Nonspecific acute low back pain    Plan:    Natural history and expected course discussed. Questions answered. Heat to affected area as needed for local pain relief. CT scan of the affected area due to presence of moving pain. con't ultram and go to ER if pain persists with pain med  Strain urine

## 2011-07-06 ENCOUNTER — Telehealth: Payer: Self-pay

## 2011-07-06 DIAGNOSIS — R109 Unspecified abdominal pain: Secondary | ICD-10-CM

## 2011-07-06 NOTE — Telephone Encounter (Signed)
Discussed with patient and she stated after our discussion yesterday she took Miralax and she moved her bowels 3 times since then but she is still having the right flank pain. I discussed with Dr.Lowne and she stated to order and Xray of the ribs and chest and of the T-spine. The orders have bene put in and the patient stated she will go the Med center and have them done tomorrow.     KP

## 2011-07-06 NOTE — Telephone Encounter (Signed)
Message copied by Arnette Norris on Thu Jul 06, 2011  4:07 PM ------      Message from: Lelon Perla      Created: Thu Jul 06, 2011 12:03 PM       There was no hematuria---probably constipation--pt she use miralax , drink a lot of water and if no good results or pain con't rto

## 2011-07-07 ENCOUNTER — Ambulatory Visit (INDEPENDENT_AMBULATORY_CARE_PROVIDER_SITE_OTHER)
Admission: RE | Admit: 2011-07-07 | Discharge: 2011-07-07 | Disposition: A | Discharge status: Home / Self Care | Payer: Medicare Other | Source: Ambulatory Visit | Attending: Family Medicine | Admitting: Family Medicine

## 2011-07-07 ENCOUNTER — Ambulatory Visit (HOSPITAL_BASED_OUTPATIENT_CLINIC_OR_DEPARTMENT_OTHER)
Admission: RE | Admit: 2011-07-07 | Discharge: 2011-07-07 | Disposition: A | Discharge status: Home / Self Care | Payer: Medicare Other | Source: Ambulatory Visit | Attending: Family Medicine | Admitting: Family Medicine

## 2011-07-07 ENCOUNTER — Telehealth: Payer: Self-pay

## 2011-07-07 DIAGNOSIS — J984 Other disorders of lung: Secondary | ICD-10-CM | POA: Insufficient documentation

## 2011-07-07 DIAGNOSIS — M4120 Other idiopathic scoliosis, site unspecified: Secondary | ICD-10-CM

## 2011-07-07 DIAGNOSIS — M549 Dorsalgia, unspecified: Secondary | ICD-10-CM | POA: Insufficient documentation

## 2011-07-07 DIAGNOSIS — M545 Low back pain, unspecified: Secondary | ICD-10-CM

## 2011-07-07 DIAGNOSIS — R109 Unspecified abdominal pain: Secondary | ICD-10-CM | POA: Insufficient documentation

## 2011-07-07 LAB — URINE CULTURE: Organism ID, Bacteria: NO GROWTH

## 2011-07-07 NOTE — Telephone Encounter (Signed)
Message copied by Arnette Norris on Fri Jul 07, 2011  5:07 PM ------      Message from: Lelon Perla      Created: Fri Jul 07, 2011 12:51 PM       + bili in urine---  Since pain is on R side----  [place] abd to r/o GB

## 2011-07-07 NOTE — Telephone Encounter (Signed)
Discussed results with patient and advised it pain get worst over the weekend go to the ED and she agreed. [place] put in    [place]

## 2011-07-11 ENCOUNTER — Ambulatory Visit (HOSPITAL_BASED_OUTPATIENT_CLINIC_OR_DEPARTMENT_OTHER)
Admission: RE | Admit: 2011-07-11 | Discharge: 2011-07-11 | Disposition: A | Discharge status: Home / Self Care | Payer: Medicare Other | Source: Ambulatory Visit | Attending: Family Medicine | Admitting: Family Medicine

## 2011-07-11 DIAGNOSIS — R10A1 Flank pain, right side: Secondary | ICD-10-CM

## 2011-07-11 DIAGNOSIS — R109 Unspecified abdominal pain: Secondary | ICD-10-CM | POA: Insufficient documentation

## 2011-07-12 ENCOUNTER — Telehealth: Payer: Self-pay | Admitting: Family Medicine

## 2011-07-12 ENCOUNTER — Telehealth: Payer: Self-pay | Admitting: Gastroenterology

## 2011-07-12 NOTE — Telephone Encounter (Signed)
Discussed with patient and she stated she was thinking that she should follow up with Dr.Kaplan as well, she stated the wait list is so long at his office, I asked would she mind seeing someone else and she declined, she is willing to wait but since she is in pain she re,ally wants [place] to try to get her in sooner. I relayed the information to [facility] who will try to get the patient seen sooner than September     kp

## 2011-07-12 NOTE — Telephone Encounter (Signed)
Spoke with Bonita Quin at [facility] and she stated Dr.Kaplan was booked but the patient can see the PA who will not have anything available until early next week. She stated she would call the patient when the scheduled opened up to offer her an appt      KP

## 2011-07-12 NOTE — Telephone Encounter (Signed)
See results

## 2011-07-12 NOTE — Telephone Encounter (Signed)
I have placed a Triage call to Dr. Marzetta Board nurse for review of patients problem, and to discuss sooner appointment with Dr. Arlyce Dice for patient.  Awaiting response.

## 2011-07-12 NOTE — Telephone Encounter (Signed)
I think she will need to be seen next week -[facility]usi should have afternoon slots all next week, thanks

## 2011-07-12 NOTE — Telephone Encounter (Signed)
Pt states that since U/S was negative what is the next step since she is still having pain.Marland KitchenPlease advise

## 2011-07-12 NOTE — Telephone Encounter (Signed)
Pt has abdominal pain. Dr. Laury Axon has done CT, ultrasound, xrays and only found constipation. Pt has been taking miralax and is getting no relief. Requesting pt be seen. Would it be possilbe to add her tomorrow at 2:30pm? Please advise.

## 2011-07-12 NOTE — Telephone Encounter (Signed)
Discussed with Dr.Lowne and she would like for the patient to follow up with the GI doctor   KP

## 2011-07-13 NOTE — Telephone Encounter (Signed)
Spoke with patient and made her aware that she will be contact by the GI doctor for an appt. She voiced understanding and apologized for her husbands behavior. I advised it was ok and if she did not get a call by Monday from Dr.Kaplan's  office t give them a call and she agreed.    KP

## 2011-07-14 NOTE — Telephone Encounter (Signed)
Pt scheduled to see Willette Cluster NP 07/19/11@1 :30pm. Renee to notify pt of appt date and time.

## 2011-07-19 ENCOUNTER — Other Ambulatory Visit: Payer: Self-pay | Admitting: Family Medicine

## 2011-07-19 ENCOUNTER — Other Ambulatory Visit (INDEPENDENT_AMBULATORY_CARE_PROVIDER_SITE_OTHER): Payer: Medicare Other

## 2011-07-19 ENCOUNTER — Encounter: Payer: Self-pay | Admitting: Nurse Practitioner

## 2011-07-19 ENCOUNTER — Ambulatory Visit (INDEPENDENT_AMBULATORY_CARE_PROVIDER_SITE_OTHER): Payer: Medicare Other | Admitting: Nurse Practitioner

## 2011-07-19 VITALS — BP 120/64 | HR 88 | Ht 64.0 in | Wt 109.0 lb

## 2011-07-19 DIAGNOSIS — M549 Dorsalgia, unspecified: Secondary | ICD-10-CM

## 2011-07-19 DIAGNOSIS — R932 Abnormal findings on diagnostic imaging of liver and biliary tract: Secondary | ICD-10-CM

## 2011-07-19 DIAGNOSIS — K5909 Other constipation: Secondary | ICD-10-CM

## 2011-07-19 DIAGNOSIS — K5904 Chronic idiopathic constipation: Secondary | ICD-10-CM

## 2011-07-19 DIAGNOSIS — K219 Gastro-esophageal reflux disease without esophagitis: Secondary | ICD-10-CM

## 2011-07-19 LAB — URINALYSIS
Bilirubin Urine: NEGATIVE
Hgb urine dipstick: NEGATIVE
Leukocytes, UA: NEGATIVE
Nitrite: NEGATIVE
Total Protein, Urine: NEGATIVE
Urobilinogen, UA: 0.2 (ref 0.0–1.0)

## 2011-07-19 LAB — HEPATIC FUNCTION PANEL
ALT: 19 U/L (ref 0–35)
Total Bilirubin: 0.5 mg/dL (ref 0.3–1.2)
Total Protein: 7.2 g/dL (ref 6.0–8.3)

## 2011-07-19 MED ORDER — ESOMEPRAZOLE MAGNESIUM 40 MG PO CPDR: DELAYED_RELEASE_CAPSULE | ORAL | Status: DC

## 2011-07-19 NOTE — Patient Instructions (Signed)
Please go to the basement level to have your labs drawn.  We have given you samples of Nexium, take 1 capsule 30 min before breakfast. Miralax samples given today, take 17 grams in 8 oz of water or clear juice every morning. You can get Miralax at [facility], [facility], [place] or any pharmacy.

## 2011-07-19 NOTE — Progress Notes (Signed)
JAVAEH MUSCATELLO [id number] 1946/01/18   HISTORY OR PRESENT ILLNESS :Ms. Mckiver is a 65 year old female followed by Dr. Arlyce Dice for history of GERD, chronic constipation and a family history colorectal cancer. Patient comes in today with multiple gastrointestinal issues:  Abdominal pain-  Several week history of lower back pain, right hip, and sometimes right lower abdominal pain. She does have chronic constipation, possibly worse lately. She had a bowel movement after taking prune juice and several other things on Monday but the pain did not get any better. The pain has no relation to eating. The majority of pain is in her right lower back.  Patient had apparently been having some hematuria as well. Evaluation thus far by primary care physician has included right rib x-ray, thoracic spine x-ray .She also had a noncontrast CT to rule out kidney stone but it was negative. The pain is much worse when she awakes in the morning time. The pain occasionally radiates upward and occasionally radiates down her right leg. She takes gabapentin for fibromyalgia under the direction of Dr. Hyacinth Meeker. She is also on Ultram which Dr. Laury Axon recently increased to see if it would help this ongoing lower back pain. Patient has noticed some improvement with the increased dose of Ultram but it is difficult for her to function on the higher dosage so she tries to use Motrin instead. She has also been given hydrocodone for the pain but does not take it for fear of exacerbating her chronic constipation. She has taken [employer] in the past but discontinued that secondary to abdominal cramping. She has gone back to stool softeners.   Regurgitation - She was last seen for evaluation of dyspepsia and GERD in April of this year. Her symptoms were fairly well controlled on Zegerid 1-2 times a day but because of some persistent regurgitation the patient had a gastric emptying scan, advised to increase her Zegerid to twice daily and use Gaviscon  as needed. Her gastric emptying study was normal. She is taking the Zegerid twice daily but in the doughnut hole so requesting something less expensive. Despite twice-daily PPI patient continues to complain of heartburn ,sore throat, and regurgitation. She points out that her voice is raspy.   Patient inquires about recent finding of urine bilirubin. I do see she had a moderate amount of bilirubin and her last urinalysis.   Current Medications, Allergies, Past Medical History, Past Surgical History, Family History and Social History were reviewed in [employer] record.   PHYSICAL EXAMINATION : General: Well developed  white female in no acute distress Head: Normocephalic and atraumatic Eyes:  sclerae anicteric,conjunctive pink. Ears: Normal auditory acuity Mouth: No deformity or lesions Neck: Supple, no masses.  Lungs: Clear throughout to auscultation Heart: Regular rate and rhythm; no murmurs heard Abdomen: Soft, nondistended, nontender. No masses or hepatomegaly noted. Normal bowel sounds Rectal: Not done Musculoskeletal: Symmetrical with no gross deformities. Moderate tenderness over and just above right ischium  Skin: No lesions on visible extremities Extremities: No edema or deformities noted Neurological: Alert oriented x 4, grossly nonfocal Cervical Nodes:  No significant cervical adenopathy Psychological:  Alert and cooperative. Normal mood and affect  ASSESSMENT AND PLAN :

## 2011-07-21 ENCOUNTER — Encounter: Payer: Self-pay | Admitting: Nurse Practitioner

## 2011-07-21 DIAGNOSIS — M549 Dorsalgia, unspecified: Secondary | ICD-10-CM | POA: Insufficient documentation

## 2011-07-21 DIAGNOSIS — K5904 Chronic idiopathic constipation: Secondary | ICD-10-CM | POA: Insufficient documentation

## 2011-07-21 DIAGNOSIS — K219 Gastro-esophageal reflux disease without esophagitis: Secondary | ICD-10-CM | POA: Insufficient documentation

## 2011-07-21 DIAGNOSIS — K5900 Constipation, unspecified: Secondary | ICD-10-CM | POA: Insufficient documentation

## 2011-07-21 DIAGNOSIS — R932 Abnormal findings on diagnostic imaging of liver and biliary tract: Secondary | ICD-10-CM | POA: Insufficient documentation

## 2011-07-21 NOTE — Assessment & Plan Note (Addendum)
Right lower back pain / RLQ pain. At this point I don't believe her pain is GI in origin, it seems to originate from her lower back and radiate around to RLQ and sometimes into right lower extremity. Pain is unrelated to meals or defecation, abdominal examination is benign. Her pain can be reproduced with palpation of right ischium and area just above that. Patient didn't recall but in January 2008 she had an MRI of her lower spine for lower back and lower extremity pain, right greater than left. At the time of the MRI there were no significant abnormalities of the lumbar spine but there was mild degenerative disc disease with minimal bulges but no impingement. Perhaps she's had disease progression and this is contributing to current lower back pain. I will defer any further workup of that to Dr. Laury Axon but will be glad to reevaluate patient as needed.

## 2011-07-21 NOTE — Assessment & Plan Note (Signed)
Chronic constipation. Trial of daily MiraLax.

## 2011-07-21 NOTE — Assessment & Plan Note (Signed)
Recent noncontrast CT scan of the abdomen and pelvis -Incidental finding of low attenuation lesions in the left hepatic lobe measuring up to 1.7 m. Low attenuation lesions also seen in the kidneys. Followup ultrasound negative for any focal liver lesions. Not sure the liver lesions are clinically significant however patient does have mildly elevated transaminases which is new for her. I will discuss with the patient's primary gastroenterologist, Dr. Arlyce Dice, if he prefers to better characterize these lesions with contrast CT scan or MRI. I will call patient with that answer.

## 2011-07-21 NOTE — Assessment & Plan Note (Signed)
Chronic regurgitation. Unremarkable upper endoscopy for dysphasia in January of this year. Continue antireflux measures. Patient had a doughnut hole, having difficulty paying for twice daily Zegerid. I do not have any Zegerid samples but gave her samples of Nexium.

## 2011-07-23 NOTE — Progress Notes (Signed)
i agree with plan outlined in this note.  MRI is probably best imaging test for liver.

## 2011-07-28 ENCOUNTER — Telehealth: Payer: Self-pay | Admitting: Nurse Practitioner

## 2011-07-28 DIAGNOSIS — R932 Abnormal findings on diagnostic imaging of liver and biliary tract: Secondary | ICD-10-CM

## 2011-07-28 NOTE — Telephone Encounter (Signed)
Spoke with patient and told her I would get Willette Cluster, NP to look at the labs but looks like the results are normal. Will check will Willette Cluster, NP; to see if patient needs any other instructions. Patient reports she has not heard anything back from Dr. Laury Axon.

## 2011-08-01 NOTE — Telephone Encounter (Signed)
[name], please let patient know that labs were normal. Liver enzymes normal and no bilirubin in urine. She will need MRI of the abdomen to evaluate the "liver lesions" seen on Ultrasound. Can you or Dr. Marzetta Board nurse schedule this please. Following that she should see Dr. Arlyce Dice in follow up. Thanks

## 2011-08-01 NOTE — Telephone Encounter (Signed)
Sheri Fusi, do you want the MRI with or without contrast. Please, advise.

## 2011-08-02 NOTE — Telephone Encounter (Signed)
Patient scheduled for MRI of abdomen at [facility] on 08/09/11 at 9:00 AM with 8:00 arrival(for labs) NPO 4 hours prior to procedure. Appointment with Dr. Arlyce Dice scheduled on 09/05/11 at 10:30 AM.

## 2011-08-02 NOTE — Telephone Encounter (Signed)
Addended by: Daphine Deutscher on: 08/02/2011 09:11 AM   Modules accepted: Orders

## 2011-08-02 NOTE — Telephone Encounter (Signed)
Spoke with Willette Cluster, NP and MRI with contrast.

## 2011-08-02 NOTE — Telephone Encounter (Signed)
Patient notified of appointments and instructions given for MRI.

## 2011-08-09 ENCOUNTER — Ambulatory Visit (HOSPITAL_COMMUNITY)
Admission: RE | Admit: 2011-08-09 | Discharge: 2011-08-09 | Disposition: A | Discharge status: Home / Self Care | Payer: Medicare Other | Source: Ambulatory Visit | Attending: Nurse Practitioner | Admitting: Nurse Practitioner

## 2011-08-09 DIAGNOSIS — D1803 Hemangioma of intra-abdominal structures: Secondary | ICD-10-CM | POA: Insufficient documentation

## 2011-08-09 DIAGNOSIS — Z8541 Personal history of malignant neoplasm of cervix uteri: Secondary | ICD-10-CM | POA: Insufficient documentation

## 2011-08-09 DIAGNOSIS — N281 Cyst of kidney, acquired: Secondary | ICD-10-CM | POA: Insufficient documentation

## 2011-08-09 DIAGNOSIS — R932 Abnormal findings on diagnostic imaging of liver and biliary tract: Secondary | ICD-10-CM

## 2011-08-09 LAB — CREATININE, SERUM: Creatinine, Ser: 1.07 mg/dL (ref 0.50–1.10)

## 2011-08-09 MED ORDER — GADOBENATE DIMEGLUMINE 529 MG/ML IV SOLN
10.0000 mL | Freq: Once | INTRAVENOUS | Status: AC | PRN
  Administered 2011-08-09: 10 mL via INTRAVENOUS

## 2011-08-10 ENCOUNTER — Telehealth: Payer: Self-pay | Admitting: *Deleted

## 2011-08-10 NOTE — Telephone Encounter (Signed)
Message copied by Daphine Deutscher on Thu Aug 10, 2011  9:10 AM ------      Message from: Meredith Pel      Created: Wed Aug 09, 2011  9:50 PM       Rene Kocher, good news, please let patient know that the liver lesions are hemangiomas. Thanks

## 2011-08-10 NOTE — Telephone Encounter (Signed)
Patient notified of results. She will go to her PCP for the flank pain.

## 2011-08-15 ENCOUNTER — Encounter: Payer: Self-pay | Admitting: Family Medicine

## 2011-08-15 ENCOUNTER — Ambulatory Visit (INDEPENDENT_AMBULATORY_CARE_PROVIDER_SITE_OTHER): Payer: Medicare Other | Admitting: Family Medicine

## 2011-08-15 VITALS — BP 112/72 | HR 66 | Temp 98.0°F | Wt 112.6 lb

## 2011-08-15 DIAGNOSIS — M545 Low back pain, unspecified: Secondary | ICD-10-CM

## 2011-08-15 DIAGNOSIS — F32A Depression, unspecified: Secondary | ICD-10-CM

## 2011-08-15 DIAGNOSIS — K219 Gastro-esophageal reflux disease without esophagitis: Secondary | ICD-10-CM

## 2011-08-15 DIAGNOSIS — Z780 Asymptomatic menopausal state: Secondary | ICD-10-CM

## 2011-08-15 DIAGNOSIS — G4700 Insomnia, unspecified: Secondary | ICD-10-CM

## 2011-08-15 DIAGNOSIS — I10 Essential (primary) hypertension: Secondary | ICD-10-CM

## 2011-08-15 DIAGNOSIS — F329 Major depressive disorder, single episode, unspecified: Secondary | ICD-10-CM

## 2011-08-15 MED ORDER — HYDROCHLOROTHIAZIDE 25 MG PO TABS: 25.0000 mg | ORAL_TABLET | Freq: Every day | ORAL | Status: DC

## 2011-08-15 MED ORDER — TRAMADOL HCL 50 MG PO TABS: ORAL_TABLET | ORAL | Status: DC

## 2011-08-15 MED ORDER — ESTROGENS CONJUGATED 0.9 MG PO TABS: ORAL_TABLET | ORAL | Status: DC

## 2011-08-15 MED ORDER — GABAPENTIN 100 MG PO CAPS: 100.0000 mg | ORAL_CAPSULE | Freq: Three times a day (TID) | ORAL | Status: DC

## 2011-08-15 MED ORDER — TRAZODONE HCL 100 MG PO TABS: 100.0000 mg | ORAL_TABLET | Freq: Every day | ORAL | Status: DC

## 2011-08-15 MED ORDER — DULOXETINE HCL 60 MG PO CPEP: 60.0000 mg | ORAL_CAPSULE | Freq: Two times a day (BID) | ORAL | Status: DC

## 2011-08-15 MED ORDER — OMEPRAZOLE-SODIUM BICARBONATE 40-1100 MG PO CAPS: 1.0000 | ORAL_CAPSULE | Freq: Every day | ORAL | Status: DC

## 2011-08-15 MED ORDER — CYCLOBENZAPRINE HCL 10 MG PO TABS: 10.0000 mg | ORAL_TABLET | Freq: Three times a day (TID) | ORAL | Status: DC | PRN

## 2011-08-15 NOTE — Progress Notes (Signed)
  Subjective:    [name] is a 65 y.o. female who presents for follow up of low back problems. Current symptoms include: pain in low back (throbbing in character; 5/10 in severity). Symptoms have worsened from the previous visit. Exacerbating factors identified by the patient are bending backwards, bending forwards, bending sideways and standing.  The following portions of the patient's history were reviewed and updated as appropriate: allergies, current medications, past family history, past medical history, past social history, past surgical history and problem list.    Objective:    BP 112/72  Pulse 66  Temp(Src) 98 F (36.7 C) (Oral)  Wt 112 lb 9.6 oz (51.075 kg)  SpO2 94% General appearance: alert, cooperative, appears stated age and no distress Abdomen: soft, non-tender; bowel sounds normal; no masses,  no organomegaly Extremities: extremities normal, atraumatic, no cyanosis or edema Pulses: 2+ and symmetric Neurologic: Alert and oriented X 3, normal strength and tone. Normal symmetric reflexes. Normal coordination and gait Motor: grossly normal Reflexes: 2+ and symmetric Coordination: normal Gait: Normal    Assessment:    Nonspecific acute low back pain    Plan:    Natural history and expected course discussed. Questions answered. Agricultural engineer distributed. Short (2-4 day) period of relative rest recommended until acute symptoms improve. Ice to affected area as needed for local pain relief. Heat to affected area as needed for local pain relief. Muscle relaxants per medication orders.  check xray

## 2011-08-15 NOTE — Patient Instructions (Signed)
Constipation in Adults Constipation is having fewer than 2 bowel movements per week. Usually, the stools are hard. As we grow older, constipation is more common. If you try to fix constipation with laxatives, the problem may get worse. This is because laxatives taken over a long period of time make the colon muscles weaker. A low-fiber diet, not taking in enough fluids, and taking some medicines may make these problems worse. MEDICATIONS THAT MAY CAUSE CONSTIPATION  Water pills (diuretics).  Calcium channel blockers (used to control blood pressure and for the heart).   Certain pain medicines (narcotics).   Anticholinergics.  Anti-inflammatory agents.   Antacids that contain aluminum.   DISEASES THAT CONTRIBUTE TO CONSTIPATION  Diabetes.  Parkinson's disease.   Dementia.   Stroke.  Depression.   Illnesses that cause problems with salt and water metabolism.   HOME CARE INSTRUCTIONS  Constipation is usually best cared for without medicines. Increasing dietary fiber and eating more fruits and vegetables is the best way to manage constipation.   Slowly increase fiber intake to 25 to 38 grams per day. Whole grains, fruits, vegetables, and legumes are good sources of fiber. A dietitian can further help you incorporate high-fiber foods into your diet.   Drink enough water and fluids to keep your urine clear or pale yellow.   A fiber supplement may be added to your diet if you cannot get enough fiber from foods.   Increasing your activities also helps improve regularity.   Suppositories, as suggested by your caregiver, will also help. If you are using antacids, such as aluminum or calcium containing products, it will be helpful to switch to products containing magnesium if your caregiver says it is okay.   If you have been given a liquid injection (enema) today, this is only a temporary measure. It should not be relied on for treatment of longstanding (chronic) constipation.    Stronger measures, such as magnesium sulfate, should be avoided if possible. This may cause uncontrollable diarrhea. Using magnesium sulfate may not allow you time to make it to the bathroom.  SEEK IMMEDIATE MEDICAL CARE IF:  There is bright red blood in the stool.   The constipation stays for more than 4 days.   There is belly (abdominal) or rectal pain.   You do not seem to be getting better.   You have any questions or concerns.  MAKE SURE YOU:  Understand these instructions.   Will watch your condition.   Will get help right away if you are not doing well or get worse.  Document Released: 09/08/2004 Document Re-Released: 03/07/2010 [facility] Patient Information 2011 [place], [place].  Back Pain (Lumbosacral Strain) Back pain is one of the most common causes of pain. There are many causes of back pain. Most are not serious conditions.  CAUSES Your backbone (spinal column) is made up of 24 main vertebral bodies, the sacrum, and the coccyx. These are held together by muscles and tough, fibrous tissue (ligaments). Nerve roots pass through the openings between the vertebrae. A sudden move or injury to the back may cause injury to, or pressure on, these nerves. This may result in localized back pain or pain movement (radiation) into the buttocks, down the leg, and into the foot. Sharp, shooting pain from the buttock down the back of the leg (sciatica) is frequently associated with a ruptured (herniated) disc. Pain may be caused by muscle spasm alone. Your caregiver can often find the cause of your pain by the details of your symptoms  and an exam. In some cases, you may need tests (such as X-rays). Your caregiver will work with you to decide if any tests are needed based on your specific exam. HOME CARE INSTRUCTIONS  Avoid an underactive lifestyle. Active exercise, as directed by your caregiver, is your greatest weapon against back pain.   Avoid hard physical activities (tennis,  racquetball, water-skiing) if you are not in proper physical condition for it. This may aggravate and/or create problems.   If you have a back problem, avoid sports requiring sudden body movements. Swimming and walking are generally safer activities.   Maintain good posture.   Avoid becoming overweight (obese).   Use bed rest for only the most extreme, sudden (acute) episode. Your caregiver will help you determine how much bed rest is necessary.   For acute conditions, you may put ice on the injured area.   Put ice in a plastic bag.   Place a towel between your skin and the bag.   Leave the ice on for 15 minutes at a time, every 2 hours, or as needed.   After you are improved and more active, it may help to apply heat for 30 minutes before activities.  See your caregiver if you are having pain that lasts longer than expected. Your caregiver can advise appropriate exercises and/or therapy if needed. With conditioning, most back problems can be avoided. SEEK IMMEDIATE MEDICAL CARE IF:  You have numbness, tingling, weakness, or problems with the use of your arms or legs.   You experience severe back pain not relieved with medicines.   There is a change in bowel or bladder control.   You have increasing pain in any area of the body, including your belly (abdomen).   You notice shortness of breath, dizziness, or feel faint.   You feel sick to your stomach (nauseous), are throwing up (vomiting), or become sweaty.   You notice discoloration of your toes or legs, or your feet get very cold.   Your back pain is getting worse.   You have an oral temperature above 100.4, not controlled by medicine.  MAKE SURE YOU:   Understand these instructions.   Will watch your condition.   Will get help right away if you are not doing well or get worse.  Document Released: 09/20/2005 Document Re-Released: 03/07/2010 [facility] Patient Information 2011 [place], [place].

## 2011-08-21 ENCOUNTER — Ambulatory Visit (HOSPITAL_BASED_OUTPATIENT_CLINIC_OR_DEPARTMENT_OTHER)
Admission: RE | Admit: 2011-08-21 | Discharge: 2011-08-21 | Disposition: A | Discharge status: Home / Self Care | Payer: Medicare Other | Source: Ambulatory Visit | Attending: Family Medicine | Admitting: Family Medicine

## 2011-08-21 DIAGNOSIS — M5137 Other intervertebral disc degeneration, lumbosacral region: Secondary | ICD-10-CM | POA: Insufficient documentation

## 2011-08-21 DIAGNOSIS — M549 Dorsalgia, unspecified: Secondary | ICD-10-CM | POA: Insufficient documentation

## 2011-08-21 DIAGNOSIS — M51379 Other intervertebral disc degeneration, lumbosacral region without mention of lumbar back pain or lower extremity pain: Secondary | ICD-10-CM | POA: Insufficient documentation

## 2011-08-21 DIAGNOSIS — M545 Low back pain, unspecified: Secondary | ICD-10-CM

## 2011-08-29 ENCOUNTER — Other Ambulatory Visit: Payer: Self-pay | Admitting: Family Medicine

## 2011-09-05 ENCOUNTER — Encounter: Payer: Self-pay | Admitting: Gastroenterology

## 2011-09-05 ENCOUNTER — Ambulatory Visit (INDEPENDENT_AMBULATORY_CARE_PROVIDER_SITE_OTHER): Payer: Medicare Other | Admitting: Gastroenterology

## 2011-09-05 DIAGNOSIS — R1310 Dysphagia, unspecified: Secondary | ICD-10-CM

## 2011-09-05 DIAGNOSIS — R932 Abnormal findings on diagnostic imaging of liver and biliary tract: Secondary | ICD-10-CM

## 2011-09-05 NOTE — Progress Notes (Signed)
History of Present Illness:  [name] has returned for evaluation of dysphagia. In January, 2012 she underwent a Savary dilatation for a possible early esophageal stricture. A prior barium swallow showed hangup of a barium tablet although exam was limited because the patient was only able to take small sips. Dysphagia improved following dilatation until  the last month when she's had recurrent dysphagia to solids. She may have to make several attempts to swallow solids successfully. She denies odonynophasia.  Patient underwent an MRI to confirm that abnormalities in the liver were consistent with hemangiomas.    Review of Systems: Pertinent positive and negative review of systems were noted in the above HPI section. All other review of systems were otherwise negative.    Current Medications, Allergies, Past Medical History, Past Surgical History, Family History and Social History were reviewed in [employer] electronic medical record  Vital signs were reviewed in today's medical record. Physical Exam: General: Well developed , well nourished, no acute distress

## 2011-09-05 NOTE — Assessment & Plan Note (Signed)
Dysphagia is possibly due to a recurrent stricture. She may also have a nonspecific motility disorder. She clearly improved following her last dilatation.  Conditions #1 repeat dilatation with a balloon dilator

## 2011-09-05 NOTE — Patient Instructions (Signed)
Your Upper Endoscopy is scheduled on 09/19/2011 at 12:30pm at [facility]

## 2011-09-19 ENCOUNTER — Ambulatory Visit (HOSPITAL_COMMUNITY)
Admission: RE | Admit: 2011-09-19 | Discharge: 2011-09-19 | Disposition: A | Discharge status: Home / Self Care | Payer: Medicare Other | Source: Ambulatory Visit | Attending: Gastroenterology | Admitting: Gastroenterology

## 2011-09-19 ENCOUNTER — Encounter: Payer: Medicare Other | Admitting: Gastroenterology

## 2011-09-19 DIAGNOSIS — K222 Esophageal obstruction: Secondary | ICD-10-CM

## 2011-09-19 DIAGNOSIS — R1310 Dysphagia, unspecified: Secondary | ICD-10-CM | POA: Insufficient documentation

## 2011-10-05 LAB — POCT CARDIAC MARKERS
CKMB, poc: <1 — ABNORMAL LOW
Myoglobin, poc: 42.5
Myoglobin, poc: 51.2
Operator id: 272551
Troponin i, poc: <0.05
Troponin i, poc: <0.05

## 2011-10-05 LAB — BASIC METABOLIC PANEL
BUN: 7
Calcium: 8.5
GFR calc Af Amer: >60
GFR calc Af Amer: >60
GFR calc non Af Amer: >60
GFR calc non Af Amer: >60
Potassium: 3.8
Potassium: 4.1
Sodium: 138
Sodium: 141

## 2011-10-05 LAB — DIFFERENTIAL
Basophils Absolute: 0
Basophils Relative: 0
Eosinophils Absolute: 0
Neutrophils Relative %: 69

## 2011-10-05 LAB — CARDIAC PANEL(CRET KIN+CKTOT+MB+TROPI)
CK, MB: 0.8
CK, MB: 1.1
Relative Index: INVALID
Relative Index: INVALID
Troponin I: 0.01
Troponin I: 0.01

## 2011-10-05 LAB — CBC
HCT: 36.5
HCT: 36.8
Hemoglobin: 12.3
MCHC: 33.6
MCV: 95.6
MCV: 96.1
Platelets: 247
Platelets: 260
RBC: 3.8 — ABNORMAL LOW
RBC: 3.85 — ABNORMAL LOW
RDW: 12.7
WBC: 4.7
WBC: 5.7

## 2011-10-05 LAB — TROPONIN I: Troponin I: 0.01

## 2011-10-05 LAB — HEPATIC FUNCTION PANEL
ALT: 15
Albumin: 3.7
Alkaline Phosphatase: 65
Bilirubin, Direct: 0.2
Total Bilirubin: 0.7
Total Protein: 6.9

## 2011-10-05 LAB — COMPREHENSIVE METABOLIC PANEL
BUN: 10
CO2: 32
Chloride: 101
Creatinine, Ser: 0.82
GFR calc non Af Amer: >60
Glucose, Bld: 84
Potassium: 3.4 — ABNORMAL LOW
Total Bilirubin: 0.3

## 2011-10-05 LAB — I-STAT 8, (EC8 V) (CONVERTED LAB)
Acid-Base Excess: 4 — ABNORMAL HIGH
Bicarbonate: 29.9 — ABNORMAL HIGH
Glucose, Bld: 78
TCO2: 31
pCO2, Ven: 50.6 — ABNORMAL HIGH
pH, Ven: 7.38 — ABNORMAL HIGH

## 2011-10-05 LAB — D-DIMER, QUANTITATIVE (NOT AT ARMC): D-Dimer, Quant: <0.22

## 2011-10-05 LAB — POCT I-STAT CREATININE
Creatinine, Ser: 1
Operator id: 272551

## 2011-10-05 LAB — LIPID PANEL
HDL: 45
LDL Cholesterol: 89
Total CHOL/HDL Ratio: 3.6
Triglycerides: 134

## 2011-10-05 LAB — PROTIME-INR
INR: 1
Prothrombin Time: 13.3

## 2011-10-05 LAB — CK TOTAL AND CKMB (NOT AT ARMC)
Relative Index: INVALID
Total CK: 51

## 2011-10-10 ENCOUNTER — Ambulatory Visit (INDEPENDENT_AMBULATORY_CARE_PROVIDER_SITE_OTHER): Payer: Medicare Other

## 2011-10-10 DIAGNOSIS — Z23 Encounter for immunization: Secondary | ICD-10-CM

## 2011-10-19 ENCOUNTER — Encounter: Payer: Self-pay | Admitting: Gastroenterology

## 2011-10-19 ENCOUNTER — Ambulatory Visit (INDEPENDENT_AMBULATORY_CARE_PROVIDER_SITE_OTHER): Payer: Medicare Other | Admitting: Gastroenterology

## 2011-10-19 DIAGNOSIS — K222 Esophageal obstruction: Secondary | ICD-10-CM

## 2011-10-19 DIAGNOSIS — K5909 Other constipation: Secondary | ICD-10-CM

## 2011-10-19 DIAGNOSIS — K5904 Chronic idiopathic constipation: Secondary | ICD-10-CM

## 2011-10-19 DIAGNOSIS — K219 Gastro-esophageal reflux disease without esophagitis: Secondary | ICD-10-CM

## 2011-10-19 HISTORY — DX: Esophageal obstruction: K22.2

## 2011-10-19 NOTE — Assessment & Plan Note (Signed)
Plan continue Nexium

## 2011-10-19 NOTE — Assessment & Plan Note (Addendum)
Patient will consider a constipation trial. If not then I will begin [place]

## 2011-10-19 NOTE — Progress Notes (Signed)
History of Present Illness:  [name] has returned following balloon dilatation of a distal esophageal stricture. Dysphagia has subsided. She also notes significant improvement in her acid reflux. Her major complaint is constipation. This occurs despite taking MiraLax one to 2 times a day. She may go several days without a spontaneous bowel movement.    Review of Systems: Pertinent positive and negative review of systems were noted in the above HPI section. All other review of systems were otherwise negative.    Current Medications, Allergies, Past Medical History, Past Surgical History, Family History and Social History were reviewed in [employer] electronic medical record  Vital signs were reviewed in today's medical record. Physical Exam: General: Well developed , well nourished, no acute distress

## 2011-10-19 NOTE — Assessment & Plan Note (Signed)
Asymptomatic following balloon dilatation

## 2011-10-19 NOTE — Patient Instructions (Signed)
[facility] will be contacting you Please follow up as needed

## 2011-11-08 ENCOUNTER — Ambulatory Visit (INDEPENDENT_AMBULATORY_CARE_PROVIDER_SITE_OTHER): Payer: Medicare Other | Admitting: Family Medicine

## 2011-11-08 ENCOUNTER — Encounter: Payer: Self-pay | Admitting: Family Medicine

## 2011-11-08 VITALS — BP 112/70 | HR 73 | Temp 98.0°F | Wt 115.0 lb

## 2011-11-08 DIAGNOSIS — J019 Acute sinusitis, unspecified: Secondary | ICD-10-CM

## 2011-11-08 DIAGNOSIS — J329 Chronic sinusitis, unspecified: Secondary | ICD-10-CM

## 2011-11-08 MED ORDER — CLARITHROMYCIN ER 500 MG PO TB24: 1000.0000 mg | ORAL_TABLET | Freq: Every day | ORAL | Status: AC

## 2011-11-08 MED ORDER — AZELASTINE-FLUTICASONE 137-50 MCG/ACT NA SUSP: 1.0000 | Freq: Two times a day (BID) | NASAL | Status: DC

## 2011-11-08 NOTE — Progress Notes (Signed)
  Subjective:     [name] is a 65 y.o. female who presents for evaluation of sinus pain. Symptoms include: congestion, facial pain, nasal congestion, post nasal drip and sinus pressure. Onset of symptoms was 3 weeks ago. Symptoms have been gradually worsening since that time. Past history is significant for no history of pneumonia or bronchitis. Patient is a former smoker. The following portions of the patient's history were reviewed and updated as appropriate: allergies, current medications, past family history, past medical history, past social history, past surgical history and problem list.  Review of Systems Pertinent items are noted in HPI.   Objective:    BP 112/70  Pulse 73  Temp(Src) 98 F (36.7 C) (Oral)  Wt 115 lb (52.164 kg)  SpO2 97% General appearance: alert, cooperative, appears stated age and no distress Ears: normal TM's and external ear canals both ears Nose: green discharge, moderate congestion, sinus tenderness bilateral Throat: lips, mucosa, and tongue normal; teeth and gums normal Lungs: clear to auscultation bilaterally Heart: regular rate and rhythm, S1, S2 normal, no murmur, click, rub or gallop Extremities: extremities normal, atraumatic, no cyanosis or edema    Assessment:    Acute bacterial sinusitis.    Plan:    Nasal saline sprays. Neti pot recommended. Instructions given. Nasal steroids per medication orders. Antihistamines per medication orders. Biaxin per medication orders. f/u prn

## 2011-11-08 NOTE — Patient Instructions (Signed)

## 2011-11-14 ENCOUNTER — Telehealth: Payer: Self-pay | Admitting: Family Medicine

## 2011-11-14 NOTE — Telephone Encounter (Signed)
I discussed with patient and she stated the medication is making her nauseated but she did not want anything for it at this time, she stated she would just call us if she needed to.     KP

## 2011-11-14 NOTE — Telephone Encounter (Signed)
She is allergic to most----is she taking it with biggest meal of the day?

## 2011-11-23 ENCOUNTER — Other Ambulatory Visit: Payer: Self-pay | Admitting: Family Medicine

## 2011-11-23 MED ORDER — ATENOLOL 50 MG PO TABS: 50.0000 mg | ORAL_TABLET | Freq: Two times a day (BID) | ORAL | Status: DC

## 2011-11-23 MED ORDER — IMIPRAMINE HCL 25 MG PO TABS: 25.0000 mg | ORAL_TABLET | Freq: Every day | ORAL | Status: DC

## 2011-11-23 MED ORDER — AMLODIPINE BESYLATE 5 MG PO TABS: 5.0000 mg | ORAL_TABLET | Freq: Every day | ORAL | Status: DC

## 2011-11-23 NOTE — Telephone Encounter (Signed)
Faxed.   KP 

## 2011-12-14 ENCOUNTER — Ambulatory Visit: Payer: Medicare Other | Admitting: Internal Medicine

## 2011-12-14 DIAGNOSIS — Z0289 Encounter for other administrative examinations: Secondary | ICD-10-CM

## 2012-01-17 ENCOUNTER — Other Ambulatory Visit: Payer: Self-pay | Admitting: Family Medicine

## 2012-01-17 MED ORDER — ATENOLOL 50 MG PO TABS: 50.0000 mg | ORAL_TABLET | Freq: Two times a day (BID) | ORAL | Status: DC

## 2012-01-17 NOTE — Telephone Encounter (Signed)
Lowne pt please advise 

## 2012-01-19 ENCOUNTER — Other Ambulatory Visit: Payer: Self-pay | Admitting: Family Medicine

## 2012-01-19 DIAGNOSIS — F329 Major depressive disorder, single episode, unspecified: Secondary | ICD-10-CM

## 2012-01-19 DIAGNOSIS — F32A Depression, unspecified: Secondary | ICD-10-CM

## 2012-01-19 MED ORDER — DULOXETINE HCL 60 MG PO CPEP: 60.0000 mg | ORAL_CAPSULE | Freq: Two times a day (BID) | ORAL | Status: DC

## 2012-01-19 NOTE — Telephone Encounter (Signed)
Call from patient and I advised request came from [facility] and she said she was out but she rather the Rx  go to [facility]     KP

## 2012-01-19 NOTE — Telephone Encounter (Signed)
msg left to call the office     KP 

## 2012-01-29 ENCOUNTER — Other Ambulatory Visit: Payer: Self-pay | Admitting: Family Medicine

## 2012-01-29 MED ORDER — ATENOLOL 50 MG PO TABS: 50.0000 mg | ORAL_TABLET | Freq: Two times a day (BID) | ORAL | Status: DC

## 2012-01-29 NOTE — Telephone Encounter (Signed)
Faxed.   KP 

## 2012-02-19 ENCOUNTER — Encounter: Payer: Self-pay | Admitting: Family Medicine

## 2012-04-04 ENCOUNTER — Ambulatory Visit: Payer: Medicare Other | Admitting: Cardiovascular Disease

## 2012-04-08 ENCOUNTER — Encounter: Payer: Self-pay | Admitting: Cardiovascular Disease

## 2012-04-08 ENCOUNTER — Other Ambulatory Visit: Payer: Self-pay | Admitting: *Deleted

## 2012-04-08 ENCOUNTER — Ambulatory Visit (INDEPENDENT_AMBULATORY_CARE_PROVIDER_SITE_OTHER): Payer: Medicare Other | Admitting: Cardiovascular Disease

## 2012-04-08 VITALS — BP 129/82 | HR 65 | Ht 64.0 in | Wt 113.0 lb

## 2012-04-08 DIAGNOSIS — I2510 Atherosclerotic heart disease of native coronary artery without angina pectoris: Secondary | ICD-10-CM

## 2012-04-08 DIAGNOSIS — R609 Edema, unspecified: Secondary | ICD-10-CM | POA: Insufficient documentation

## 2012-04-08 DIAGNOSIS — I10 Essential (primary) hypertension: Secondary | ICD-10-CM

## 2012-04-08 DIAGNOSIS — E785 Hyperlipidemia, unspecified: Secondary | ICD-10-CM

## 2012-04-08 LAB — LIPID PANEL
HDL: 68.4 mg/dL (ref >39.00)
Total CHOL/HDL Ratio: 3
VLDL: 31.6 mg/dL (ref 0.0–40.0)

## 2012-04-08 LAB — HEPATIC FUNCTION PANEL
AST: 21 U/L (ref 0–37)
Alkaline Phosphatase: 65 U/L (ref 39–117)
Bilirubin, Direct: 0.1 mg/dL (ref 0.0–0.3)
Total Bilirubin: 0.3 mg/dL (ref 0.3–1.2)
Total Protein: 7.3 g/dL (ref 6.0–8.3)

## 2012-04-08 MED ORDER — FUROSEMIDE 20 MG PO TABS: 20.0000 mg | ORAL_TABLET | Freq: Every day | ORAL | Status: DC

## 2012-04-08 MED ORDER — ROSUVASTATIN CALCIUM 10 MG PO TABS: 10.0000 mg | ORAL_TABLET | Freq: Every day | ORAL | Status: DC

## 2012-04-08 NOTE — Patient Instructions (Addendum)
Your physician wants you to follow-up in: 12 months. You will receive a reminder letter in the mail two months in advance. If you don't receive a letter, please call our office to schedule the follow-up appointment.  Your physician has recommended you make the following change in your medication: Stop hydrochlorothiazide. Start Lasix 20 mg by mouth daily  We will call you with results of lab work done today

## 2012-04-08 NOTE — Assessment & Plan Note (Signed)
Stable. Lipids are at goal. Will repeat lipids today. BP is well controlled. NO changes.

## 2012-04-08 NOTE — Assessment & Plan Note (Signed)
Daily dependent edema. Will stop HCTZ and will start Lasix 20 mg po Qdaily. She will eat a banana daily.

## 2012-04-08 NOTE — Progress Notes (Signed)
History of Present Illness: 66 yo WF with history of CAD, asthma, GERD, HTN and hypothyroidism who was admitted to [facility] August 05, 2010 with exertional chest pain and dyspnea. Her EKG showed inferolateral ST segment depression. She was admitted and underwent cardiac cath which showed mild non-obstructive disease. Her LVEF was normal. D-dimer was normal. Cardiac enzymes were negative.  I saw her in my clinic last in March 2012.   She has been doing well. She describes some SOB lately. She thinks she is having seasonal allergies. No chest pain. Overall she feels well. She is having stress at home with her dying mother and some relationship issues with her husband.   Primary Care Physician: Loreen Freud  Last Lipid Profile: February 2012    Total chol: 141     HDL: 58    LDL 64  Past Medical History  Diagnosis Date  . GERD (gastroesophageal reflux disease)   . Arthritis   . Asthma   . Skin cancer   . Hypertension   . Migraine   . History of colonic polyps   . Hypothyroidism   . Bronchitis     chronic  . Irregular heartbeat   . CAD (coronary artery disease)     mild non-obstructive cad    Past Surgical History  Procedure Date  . Appendectomy   . Tonsillectomy   . Breast biopsy   . Skin cancer removal   . Vocal cord cyst removal   . Nasal sinus surgery   . Right ankle   . Right hand surgery   . Abdominal hysterectomy     Current Outpatient Prescriptions  Medication Sig Dispense Refill  . albuterol (PROAIR HFA) 108 (90 BASE) MCG/ACT inhaler As directed  1 Inhaler  0  . amLODipine (NORVASC) 5 MG tablet Take 1 tablet (5 mg total) by mouth daily.  90 tablet  1  . aspirin 81 MG tablet Take 81 mg by mouth daily.        Marland Kitchen atenolol (TENORMIN) 50 MG tablet Take 1 tablet (50 mg total) by mouth 2 (two) times daily.  180 tablet  1  . Azelastine-Fluticasone (DYMISTA) 137-50 MCG/ACT SUSP Place 1 spray into the nose 2 (two) times daily.      . B Complex Vitamins  (B-COMPLEX/B-12 PO) Take 1 tablet by mouth daily.        . Calcium Carb-Cholecalciferol (CALCIUM 1000 + D PO) Take 2 tablets by mouth daily.        . Cholecalciferol (VITAMIN D) 1000 UNITS capsule Take 1,000 Units by mouth daily.        . cyclobenzaprine (FLEXERIL) 10 MG tablet Take 1 tablet (10 mg total) by mouth every 8 (eight) hours as needed for muscle spasms.  30 tablet  1  . desloratadine (CLARINEX) 5 MG tablet Take 5 mg by mouth daily.        Tery Sanfilippo Calcium (STOOL SOFTENER PO) Take by mouth. As needed       . DULoxetine (CYMBALTA) 60 MG capsule Take 1 capsule (60 mg total) by mouth 2 (two) times daily.  90 capsule  3  . esomeprazole (NEXIUM) 40 MG capsule Take 1 capsule 30 min before breakfast. Lot# [id number] Exp date: 03/2014  30 capsule  0  . estrogens, conjugated, (PREMARIN) 0.9 MG tablet 1 po qd  90 tablet  3  . gabapentin (NEURONTIN) 100 MG capsule Take 1 capsule (100 mg total) by mouth 3 (three) times daily.  90 capsule  3  . hydrochlorothiazide 25 MG tablet Take 1 tablet (25 mg total) by mouth daily.  90 tablet  3  . imipramine (TOFRANIL) 25 MG tablet Take 1 tablet (25 mg total) by mouth at bedtime.  90 tablet  0  . mometasone (NASONEX) 50 MCG/ACT nasal spray 2 sprays by Nasal route daily.        . Multiple Vitamin (MULTIVITAMIN) tablet Take 1 tablet by mouth daily.        . nitroGLYCERIN (NITROSTAT) 0.4 MG SL tablet Place 0.4 mg under the tongue every 5 (five) minutes as needed.        . polyethylene glycol (MIRALAX / GLYCOLAX) packet Take 17 g by mouth daily.        . Probiotic Product (PA PROBIOTIC COMPLEX) TABS Take 1 tablet by mouth daily.        . rosuvastatin (CRESTOR) 10 MG tablet Take 10 mg by mouth daily.        . traMADol (ULTRAM) 50 MG tablet 1-2 po every 6 hours prn  60 tablet  0  . traZODone (DESYREL) 100 MG tablet Take 1 tablet (100 mg total) by mouth at bedtime.  90 tablet  3  . triamcinolone (KENALOG) 0.1 % ointment Apply topically 2 (two) times daily.  30 g  0    . zoster vaccine live, PF, (ZOSTAVAX) 16109 UNT/0.65ML injection Inject 0.65 mLs into the skin once.          Allergies  Allergen Reactions  . Cleocin Shortness Of Breath and Rash  . Prednisone Shortness Of Breath  . Codeine   . Penicillins   . Pentazocine Lactate   . Pneumococcal Vaccine Polyvalent     REACTION: redness, swelling, and hives at injection site    History   Social History  . Marital Status: Married    Spouse Name: N/A    Number of Children: 3  . Years of Education: N/A   Occupational History  . Retired Charity fundraiser    Social History Main Topics  . Smoking status: Former Games developer  . Smokeless tobacco: Never Used  . Alcohol Use: Yes  . Drug Use: No  . Sexually Active: Not on file   Other Topics Concern  . Not on file   Social History Narrative  . No narrative on file    Family History  Problem Relation Age of Onset  . Rectal cancer Mother   . Cancer Mother     rectal  . Irritable bowel syndrome Son   . Irritable bowel syndrome Daughter   . Heart disease Other   . Arthritis Other   . Cancer Other     breast cancer 1st degree relative <50, lung  . Diabetes Other     1st degree relative   . Hyperlipidemia Other   . Hypertension Other   . Osteoporosis Other   . Coronary artery disease Other 64    Review of Systems:  As stated in the HPI and otherwise negative.   BP 129/82  Pulse 65  Ht 5\' 4"  (1.626 m)  Wt 113 lb (51.256 kg)  BMI 19.40 kg/m2  Physical Examination: General: Well developed, well nourished, NAD HEENT: OP clear, mucus membranes moist SKIN: warm, dry. No rashes. Neuro: No focal deficits Musculoskeletal: Muscle strength 5/5 all ext Psychiatric: Mood and affect normal Neck: No JVD, no carotid bruits, no thyromegaly, no lymphadenopathy. Lungs:Clear bilaterally, no wheezes, rhonci, crackles Cardiovascular: Regular rate and rhythm. No murmurs, gallops or rubs. Abdomen:Soft. Bowel sounds present.  Non-tender.  Extremities: No lower  extremity edema. Pulses are 2 + in the bilateral DP/PT.  EKG: NSR, rate 65 bpm.

## 2012-04-11 ENCOUNTER — Telehealth: Payer: Self-pay | Admitting: *Deleted

## 2012-04-11 NOTE — Telephone Encounter (Signed)
Pt received message and will restart her Crestor Mylo Red RN

## 2012-04-18 ENCOUNTER — Other Ambulatory Visit: Payer: Self-pay | Admitting: *Deleted

## 2012-04-18 DIAGNOSIS — E785 Hyperlipidemia, unspecified: Secondary | ICD-10-CM

## 2012-04-29 ENCOUNTER — Telehealth: Payer: Self-pay | Admitting: Family Medicine

## 2012-04-29 MED ORDER — ATENOLOL 50 MG PO TABS: 50.0000 mg | ORAL_TABLET | Freq: Two times a day (BID) | ORAL | Status: DC

## 2012-04-29 NOTE — Telephone Encounter (Signed)
Refill: Atenolol tablet 50 mg. Take 1 by mouth twice daily.  90 day supply

## 2012-04-29 NOTE — Telephone Encounter (Signed)
Letter mailed to schedule a Physical.     KP

## 2012-05-02 ENCOUNTER — Encounter: Payer: Self-pay | Admitting: Family Medicine

## 2012-05-02 ENCOUNTER — Other Ambulatory Visit: Payer: Self-pay | Admitting: Family Medicine

## 2012-05-02 ENCOUNTER — Ambulatory Visit (INDEPENDENT_AMBULATORY_CARE_PROVIDER_SITE_OTHER): Payer: Medicare Other | Admitting: Family Medicine

## 2012-05-02 VITALS — BP 118/60 | HR 75 | Temp 98.4°F | Wt 114.4 lb

## 2012-05-02 DIAGNOSIS — Z2089 Contact with and (suspected) exposure to other communicable diseases: Secondary | ICD-10-CM

## 2012-05-02 DIAGNOSIS — J339 Nasal polyp, unspecified: Secondary | ICD-10-CM

## 2012-05-02 DIAGNOSIS — Z20818 Contact with and (suspected) exposure to other bacterial communicable diseases: Secondary | ICD-10-CM

## 2012-05-02 MED ORDER — MUPIROCIN CALCIUM 2 % NA OINT: TOPICAL_OINTMENT | Freq: Two times a day (BID) | NASAL | Status: DC

## 2012-05-02 NOTE — Patient Instructions (Signed)
MRSA Overview MRSA stands for methicillin-resistant Staphylococcus aureus. It is a type of bacteria that is resistant to some common antibiotics. It can cause infections in the skin and many other places in the body. Staphylococcus aureus, often called "staph," is a bacteria that normally lives on the skin or in the nose. Staph on the surface of the skin or in the nose does not cause problems. However, if the staph enters the body through a cut, wound, or break in the skin, an infection can happen. Up until recently, infections with the MRSA type of staph mainly occurred in hospitals and other healthcare settings. There are now increasing problems with MRSA infections in the community as well. Infections with MRSA may be very serious or even life-threatening. Most MRSA infections are acquired in one of two ways:  Healthcare-associated MRSA (HA-MRSA)   This can be acquired by people in any healthcare setting. MRSA can be a big problem for hospitalized people, people in nursing homes, people in rehabilitation facilities, people with weakened immune systems, dialysis patients, and those who have had surgery.   Community-associated MRSA (CA-MRSA)   Community spread of MRSA is becoming more common. It is known to spread in crowded settings, in jails and prisons, and in situations where there is close skin-to-skin contact, such as during sporting events or in locker rooms. MRSA can be spread through shared items, such as children's toys, razors, towels, or sports equipment.  CAUSES  All staph, including MRSA, are normally harmless unless they enter the body through a scratch, cut, or wound, such as with surgery. All staph, including MRSA, can be spread from person-to-person by touching contaminated objects or through direct contact. SPECIAL GROUPS MRSA can present problems for special groups of people. Some of these groups include:  Breastfeeding women.   The most common problem is MRSA infection of the  breast (mastitis). There is evidence that MRSA can be passed to an infant from infected breast milk. Your caregiver may recommend that you stop breastfeeding until the mastitis is under control.   If you are breastfeeding and have a MRSA infection in a place other than the breast, you may usually continue breastfeeding while under treatment. If taking antibiotics, ask your caregiver if it is safe to continue breastfeeding while taking your prescribed medicines.   Neonates (babies from birth to 1 month old) and infants (babies from 1 month to 1 year old).   There is evidence that MRSA can be passed to a newborn at birth if the mother has MRSA on the skin, in or around the birth canal, or an infection in the uterus, cervix, or vagina. MRSA infection can have the same appearance as a normal newborn or infant rash or several other skin infections. This can make it hard to diagnose MRSA.   Immune compromised people.   If you have an immune system problem, you may have a higher chance of developing a MRSA infection.   People after any type of surgery.   Staph in general, including MRSA, is the most common cause of infections occurring at the site of recent surgery.   People on long-term steroid medicines.   These kinds of medicines can lower your resistance to infection. This can increase your chance of getting MRSA.   People who have had frequent hospitalizations, live in nursing homes or other residential care facilities, have venous or urinary catheters, or have taken multiple courses of antibiotic therapy for any reason.  DIAGNOSIS  Diagnosis of MRSA is   done by cultures of fluid samples that may come from:  Swabs taken from cuts or wounds in infected areas.   Nasal swabs.   Saliva or deep cough specimens from the lungs (sputum).   Urine.   Blood.  Many people are "colonized" with MRSA but have no signs of infection. This means that people carry the MRSA germ on their skin or in their  nose and may never develop MRSA infection.  TREATMENT  Treatment varies and is based on how serious, how deep, or how extensive the infection is. For example:  Some skin infections, such as a small boil or abscess, may be treated by draining yellowish-white fluid (pus) from the site of the infection.   Deeper or more widespread soft tissue infections are usually treated with surgery to drain pus and with antibiotic medicine given by vein or by mouth. This may be recommended even if you are pregnant.   Serious infections may require a hospital stay.  If antibiotics are given, they may be needed for several weeks. PREVENTION  Because many people are colonized with staph, including MRSA, preventing the spread of the bacteria from person-to-person is most important. The best way to prevent the spread of bacteria and other germs is through proper hand washing or by using alcohol-based hand disinfectants. The following are other ways to help prevent MRSA infection within the hospital and community settings.   Healthcare settings:   Strict hand washing or hand disinfection procedures need to be followed before and after touching every patient.   Patients infected with MRSA are placed in isolation to prevent the spread of the bacteria.   Healthcare workers need to wear disposable gowns and gloves when touching or caring for patients infected with MRSA. Visitors may also be asked to wear a gown and gloves.   Hospital surfaces need to be disinfected frequently.   Community settings:   Wash your hands frequently with soap and water for at least 15 seconds. Otherwise, use alcohol-based hand disinfectants when soap and water is not available.   Make sure people who live with you wash their hands often, too.   Do not share personal items. For example, avoid sharing razors and other personal hygiene items, towels, clothing, and athletic equipment.   Wash and dry your clothes and bedding at the  warmest temperatures recommended on the labels.   Keep wounds covered. Pus from infected sores may contain MRSA and other bacteria. Keep cuts and abrasions clean and covered with germ-free (sterile), dry bandages until they are healed.   If you have a wound that appears infected, ask your caregiver if a culture for MRSA and other bacteria should be done.   If you are breastfeeding, talk to your caregiver about MRSA. You may be asked to temporarily stop breastfeeding.  HOME CARE INSTRUCTIONS   Take your antibiotics as directed. Finish them even if you start to feel better.   Avoid close contact with those around you as much as possible. Do not use towels, razors, toothbrushes, bedding, or other items that will be used by others.   To fight the infection, follow your caregiver's instructions for wound care. Wash your hands before and after changing your bandages.   If you have an intravascular device, such as a catheter, make sure you know how to care for it.   Be sure to tell any healthcare providers that you have MRSA so they are aware of your infection.  SEEK IMMEDIATE MEDICAL CARE IF:     The infection appears to be getting worse. Signs include:   Increased warmth, redness, or tenderness around the wound site.   A red line that extends from the infection site.   A dark color in the area around the infection.   Wound drainage that is tan, yellow, or green.   A bad smell coming from the wound.   You feel sick to your stomach (nauseous) and throw up (vomit) or cannot keep medicine down.   You have a fever.   Your baby is older than 3 months with a rectal temperature of 102 F (38.9 C) or higher.   Your baby is 3 months old or younger with a rectal temperature of 100.4 F (38 C) or higher.   You have difficulty breathing.  MAKE SURE YOU:   Understand these instructions.   Will watch your condition.   Will get help right away if you are not doing well or get worse.    Document Released: 12/11/2005 Document Revised: 11/30/2011 Document Reviewed: 03/15/2011 [employer] Patient Information 2012 [employer]. 

## 2012-05-02 NOTE — Progress Notes (Signed)
  Subjective:    Patient ID: [name], female    DOB: [birth year], 66 y.o.   MRN: [id number]  HPI Pt here c/o sore/ mass in nose that bleeds a lot.   Her daughter was dx with staph infection and she is worried that is what she has.  No other complaints.   Review of Systems as above   Objective:   Physical Exam  Constitutional: She is oriented to person, place, and time. She appears well-developed and well-nourished.  HENT:       + nasal polyp? + green d/c   Neck: Normal range of motion. Neck supple.  Lymphadenopathy:    She has no cervical adenopathy.  Neurological: She is alert and oriented to person, place, and time.  Psychiatric: She has a normal mood and affect. Her behavior is normal. Judgment and thought content normal.          Assessment & Plan:  Nasal polyp--- and exposure to staff                nasonex and bactroban               Will refer to ENT prn

## 2012-05-05 LAB — MRSA CULTURE

## 2012-05-08 ENCOUNTER — Telehealth: Payer: Self-pay | Admitting: *Deleted

## 2012-05-08 NOTE — Telephone Encounter (Signed)
Pt return call,Left message to call office.  

## 2012-05-08 NOTE — Telephone Encounter (Signed)
msg left to call the office     KP 

## 2012-05-08 NOTE — Telephone Encounter (Signed)
patient stated the only thing she got was the Bactroban and she has another place on the nose. Please advise   KP

## 2012-05-08 NOTE — Telephone Encounter (Signed)
She had steroid nasal spray, nasonex  and bactroban-----another spot in the nose or somewhere else?

## 2012-05-08 NOTE — Telephone Encounter (Signed)
Nothing more than what we did--- I gave her abx cream  And steroid

## 2012-05-08 NOTE — Telephone Encounter (Signed)
+   staph---but it is not MRSA

## 2012-05-08 NOTE — Telephone Encounter (Signed)
Pt is returning a call from the ofc. Per EPIC: Culture is neg for MRSA, + for staph. She questions what she needs to for the areas in her nose and sinuses.

## 2012-05-08 NOTE — Telephone Encounter (Signed)
Pt calling for results of MRSA culture. .Please advise

## 2012-05-09 MED ORDER — CEPHALEXIN 500 MG PO CAPS: 500.0000 mg | ORAL_CAPSULE | Freq: Two times a day (BID) | ORAL | Status: AC

## 2012-05-09 NOTE — Telephone Encounter (Signed)
Discussed with patient and she voiced understanding and agreed to try the Keflex, she will call with any concerns.    KP

## 2012-05-09 NOTE — Telephone Encounter (Signed)
Keflex 500 mg  1 po bid for 10 days

## 2012-05-09 NOTE — Telephone Encounter (Signed)
Another spot on the nose. Please advise   KP

## 2012-05-09 NOTE — Telephone Encounter (Signed)
Addended by: Arnette Norris on: 05/09/2012 10:55 AM   Modules accepted: Orders

## 2012-05-28 ENCOUNTER — Encounter: Payer: Self-pay | Admitting: Family Medicine

## 2012-05-28 ENCOUNTER — Ambulatory Visit (INDEPENDENT_AMBULATORY_CARE_PROVIDER_SITE_OTHER): Payer: Medicare Other | Admitting: Family Medicine

## 2012-05-28 VITALS — BP 122/80 | HR 89 | Temp 98.1°F | Wt 116.8 lb

## 2012-05-28 DIAGNOSIS — R062 Wheezing: Secondary | ICD-10-CM

## 2012-05-28 DIAGNOSIS — J209 Acute bronchitis, unspecified: Secondary | ICD-10-CM

## 2012-05-28 DIAGNOSIS — J40 Bronchitis, not specified as acute or chronic: Secondary | ICD-10-CM

## 2012-05-28 DIAGNOSIS — N6452 Nipple discharge: Secondary | ICD-10-CM

## 2012-05-28 DIAGNOSIS — N6459 Other signs and symptoms in breast: Secondary | ICD-10-CM

## 2012-05-28 MED ORDER — BECLOMETHASONE DIPROPIONATE 40 MCG/ACT IN AERS: 2.0000 | INHALATION_SPRAY | Freq: Two times a day (BID) | RESPIRATORY_TRACT | Status: DC

## 2012-05-28 MED ORDER — FLUCONAZOLE 150 MG PO TABS: 150.0000 mg | ORAL_TABLET | Freq: Once | ORAL | Status: AC

## 2012-05-28 MED ORDER — AZITHROMYCIN 250 MG PO TABS: ORAL_TABLET | ORAL | Status: DC

## 2012-05-28 MED ORDER — ALBUTEROL SULFATE (5 MG/ML) 0.5% IN NEBU
2.5000 mg | INHALATION_SOLUTION | Freq: Once | RESPIRATORY_TRACT | Status: AC
  Administered 2012-05-28: 2.5 mg via RESPIRATORY_TRACT

## 2012-05-28 MED ORDER — ALBUTEROL SULFATE HFA 108 (90 BASE) MCG/ACT IN AERS: INHALATION_SPRAY | RESPIRATORY_TRACT | Status: DC

## 2012-05-28 NOTE — Progress Notes (Signed)
  Subjective:     [name] is a 65 y.o. female here for evaluation of a cough. Onset of symptoms was several weeks ago. --see last visit--- pt doesn't feel much better.   Symptoms have been gradually improving since that time. The cough is productive and is aggravated by infection, pollens and reclining position. Associated symptoms include: wheezing. Patient does have a history of asthma. Patient does have a history of environmental allergens. Patient has not traveled recently. Patient does have a history of smoking. Patient has not had a previous chest x-ray. Patient has not had a PPD done.  The following portions of the patient's history were reviewed and updated as appropriate: allergies, current medications, past family history, past medical history, past social history, past surgical history and problem list.  Review of Systems Pertinent items are noted in HPI.    Objective:    Oxygen saturation 92% on room air BP 122/80  Pulse 89  Temp(Src) 98.1 F (36.7 C) (Oral)  Wt 116 lb 12.8 oz (52.98 kg)  SpO2 92% General appearance: alert, cooperative, appears stated age and mild distress Ears: normal TM's and external ear canals both ears Nose: Nares normal. Septum midline. Mucosa normal. No drainage or sinus tenderness. Throat: lips, mucosa, and tongue normal; teeth and gums normal Lungs: wheezes bilaterally Breasts: normal appearance, no masses or tenderness--- no d/c Heart: regular rate and rhythm, S1, S2 normal, no murmur, click, rub or gallop Lymph nodes: Cervical, supraclavicular, and axillary nodes normal.    Assessment:    Acute Bronchitis   spontaneous nipple d/c-- diagnostic mammogram Plan:    Antibiotics per medication orders. Avoid exposure to tobacco smoke and fumes. B-agonist inhaler. Call if shortness of breath worsens, blood in sputum, change in character of cough, development of fever or chills, inability to maintain nutrition and hydration. Avoid exposure to  tobacco smoke and fumes. Steroid inhaler as ordered.

## 2012-05-28 NOTE — Patient Instructions (Signed)

## 2012-05-31 ENCOUNTER — Telehealth: Payer: Self-pay | Admitting: *Deleted

## 2012-05-31 ENCOUNTER — Ambulatory Visit (HOSPITAL_BASED_OUTPATIENT_CLINIC_OR_DEPARTMENT_OTHER)
Admission: RE | Admit: 2012-05-31 | Discharge: 2012-05-31 | Disposition: A | Discharge status: Home / Self Care | Payer: Medicare Other | Source: Ambulatory Visit | Attending: Family Medicine | Admitting: Family Medicine

## 2012-05-31 DIAGNOSIS — R05 Cough: Secondary | ICD-10-CM

## 2012-05-31 DIAGNOSIS — R059 Cough, unspecified: Secondary | ICD-10-CM | POA: Insufficient documentation

## 2012-05-31 DIAGNOSIS — R0989 Other specified symptoms and signs involving the circulatory and respiratory systems: Secondary | ICD-10-CM | POA: Insufficient documentation

## 2012-05-31 MED ORDER — MOXIFLOXACIN HCL 400 MG PO TABS: 400.0000 mg | ORAL_TABLET | Freq: Every day | ORAL | Status: DC

## 2012-05-31 NOTE — Telephone Encounter (Signed)
mucinex will help make cough productive Change abx to avelox 400 mg 1 po qd x5 days  cxr 2 view

## 2012-05-31 NOTE — Telephone Encounter (Signed)
Pt still c/o wheezing, cough,sob, chest/ back hurt, rattle/congestion in chest.. Pt would like to know if something can be given to help her cough be productive. Pt notes that cough is dry but she has a lot of rattle and congestion. Pt indicated that symptom are the same as at OV on 05-28-12 but she is unable to productively cough to get mucous up. Please advise

## 2012-05-31 NOTE — Telephone Encounter (Signed)
Discussed with patient and she voiced understanding, she has been instructed to stop the old ABX and she can start the new one and proceed to Saturday clinic if she is not feeling better in the morning, and go to the med center for her CXR she agreed to do so.    KP

## 2012-06-01 ENCOUNTER — Other Ambulatory Visit: Payer: Self-pay | Admitting: Family Medicine

## 2012-06-03 ENCOUNTER — Emergency Department (HOSPITAL_COMMUNITY): Payer: Medicare Other

## 2012-06-03 ENCOUNTER — Inpatient Hospital Stay (HOSPITAL_COMMUNITY)
Admission: EM | Admit: 2012-06-03 | Discharge: 2012-06-06 | DRG: 190 | Disposition: A | Discharge status: Home / Self Care | Payer: Medicare Other | Attending: Internal Medicine | Admitting: Internal Medicine

## 2012-06-03 ENCOUNTER — Encounter (HOSPITAL_COMMUNITY): Payer: Self-pay | Admitting: *Deleted

## 2012-06-03 ENCOUNTER — Ambulatory Visit (INDEPENDENT_AMBULATORY_CARE_PROVIDER_SITE_OTHER): Payer: Medicare Other | Admitting: Family Medicine

## 2012-06-03 ENCOUNTER — Ambulatory Visit: Payer: Medicare Other | Admitting: Family Medicine

## 2012-06-03 ENCOUNTER — Encounter: Payer: Self-pay | Admitting: Family Medicine

## 2012-06-03 VITALS — BP 121/75 | HR 77 | Temp 99.0°F | Ht 64.0 in | Wt 113.0 lb

## 2012-06-03 DIAGNOSIS — J4489 Other specified chronic obstructive pulmonary disease: Secondary | ICD-10-CM | POA: Diagnosis present

## 2012-06-03 DIAGNOSIS — Z87898 Personal history of other specified conditions: Secondary | ICD-10-CM

## 2012-06-03 DIAGNOSIS — J9691 Respiratory failure, unspecified with hypoxia: Secondary | ICD-10-CM | POA: Diagnosis present

## 2012-06-03 DIAGNOSIS — G4700 Insomnia, unspecified: Secondary | ICD-10-CM

## 2012-06-03 DIAGNOSIS — Z79899 Other long term (current) drug therapy: Secondary | ICD-10-CM

## 2012-06-03 DIAGNOSIS — J329 Chronic sinusitis, unspecified: Secondary | ICD-10-CM

## 2012-06-03 DIAGNOSIS — F411 Generalized anxiety disorder: Secondary | ICD-10-CM

## 2012-06-03 DIAGNOSIS — J440 Chronic obstructive pulmonary disease with acute lower respiratory infection: Principal | ICD-10-CM | POA: Diagnosis present

## 2012-06-03 DIAGNOSIS — F329 Major depressive disorder, single episode, unspecified: Secondary | ICD-10-CM

## 2012-06-03 DIAGNOSIS — E039 Hypothyroidism, unspecified: Secondary | ICD-10-CM

## 2012-06-03 DIAGNOSIS — Z780 Asymptomatic menopausal state: Secondary | ICD-10-CM

## 2012-06-03 DIAGNOSIS — N644 Mastodynia: Secondary | ICD-10-CM

## 2012-06-03 DIAGNOSIS — J019 Acute sinusitis, unspecified: Secondary | ICD-10-CM

## 2012-06-03 DIAGNOSIS — I2510 Atherosclerotic heart disease of native coronary artery without angina pectoris: Secondary | ICD-10-CM

## 2012-06-03 DIAGNOSIS — R0789 Other chest pain: Secondary | ICD-10-CM

## 2012-06-03 DIAGNOSIS — R5383 Other fatigue: Secondary | ICD-10-CM

## 2012-06-03 DIAGNOSIS — J209 Acute bronchitis, unspecified: Principal | ICD-10-CM | POA: Diagnosis present

## 2012-06-03 DIAGNOSIS — L237 Allergic contact dermatitis due to plants, except food: Secondary | ICD-10-CM

## 2012-06-03 DIAGNOSIS — Z85828 Personal history of other malignant neoplasm of skin: Secondary | ICD-10-CM

## 2012-06-03 DIAGNOSIS — R0602 Shortness of breath: Secondary | ICD-10-CM

## 2012-06-03 DIAGNOSIS — N390 Urinary tract infection, site not specified: Secondary | ICD-10-CM

## 2012-06-03 DIAGNOSIS — J441 Chronic obstructive pulmonary disease with (acute) exacerbation: Secondary | ICD-10-CM

## 2012-06-03 DIAGNOSIS — K5904 Chronic idiopathic constipation: Secondary | ICD-10-CM

## 2012-06-03 DIAGNOSIS — R609 Edema, unspecified: Secondary | ICD-10-CM

## 2012-06-03 DIAGNOSIS — R0609 Other forms of dyspnea: Secondary | ICD-10-CM

## 2012-06-03 DIAGNOSIS — R5381 Other malaise: Secondary | ICD-10-CM

## 2012-06-03 DIAGNOSIS — I10 Essential (primary) hypertension: Secondary | ICD-10-CM | POA: Diagnosis present

## 2012-06-03 DIAGNOSIS — K5900 Constipation, unspecified: Secondary | ICD-10-CM

## 2012-06-03 DIAGNOSIS — B370 Candidal stomatitis: Secondary | ICD-10-CM | POA: Diagnosis present

## 2012-06-03 DIAGNOSIS — I6529 Occlusion and stenosis of unspecified carotid artery: Secondary | ICD-10-CM

## 2012-06-03 DIAGNOSIS — M79609 Pain in unspecified limb: Secondary | ICD-10-CM

## 2012-06-03 DIAGNOSIS — K222 Esophageal obstruction: Secondary | ICD-10-CM

## 2012-06-03 DIAGNOSIS — R0902 Hypoxemia: Secondary | ICD-10-CM

## 2012-06-03 DIAGNOSIS — Z87891 Personal history of nicotine dependence: Secondary | ICD-10-CM | POA: Diagnosis present

## 2012-06-03 DIAGNOSIS — F17200 Nicotine dependence, unspecified, uncomplicated: Secondary | ICD-10-CM | POA: Diagnosis present

## 2012-06-03 DIAGNOSIS — IMO0001 Reserved for inherently not codable concepts without codable children: Secondary | ICD-10-CM

## 2012-06-03 DIAGNOSIS — Z720 Tobacco use: Secondary | ICD-10-CM

## 2012-06-03 DIAGNOSIS — I479 Paroxysmal tachycardia, unspecified: Secondary | ICD-10-CM | POA: Diagnosis present

## 2012-06-03 DIAGNOSIS — F32A Depression, unspecified: Secondary | ICD-10-CM

## 2012-06-03 DIAGNOSIS — K219 Gastro-esophageal reflux disease without esophagitis: Secondary | ICD-10-CM | POA: Diagnosis present

## 2012-06-03 DIAGNOSIS — M549 Dorsalgia, unspecified: Secondary | ICD-10-CM

## 2012-06-03 DIAGNOSIS — J9600 Acute respiratory failure, unspecified whether with hypoxia or hypercapnia: Secondary | ICD-10-CM | POA: Diagnosis present

## 2012-06-03 DIAGNOSIS — R932 Abnormal findings on diagnostic imaging of liver and biliary tract: Secondary | ICD-10-CM

## 2012-06-03 HISTORY — DX: Other specified chronic obstructive pulmonary disease: J44.89

## 2012-06-03 LAB — BASIC METABOLIC PANEL
BUN: 15 mg/dL (ref 6–23)
CO2: 29 mEq/L (ref 19–32)
Calcium: 9 mg/dL (ref 8.4–10.5)
Creatinine, Ser: 0.82 mg/dL (ref 0.50–1.10)
GFR calc Af Amer: 85 mL/min — ABNORMAL LOW (ref >90)
Glucose, Bld: 97 mg/dL (ref 70–99)
Sodium: 137 mEq/L (ref 135–145)

## 2012-06-03 LAB — CBC
Hemoglobin: 13.1 g/dL (ref 12.0–15.0)
MCH: 30.5 pg (ref 26.0–34.0)
MCHC: 33.2 g/dL (ref 30.0–36.0)
MCV: 91.8 fL (ref 78.0–100.0)
Platelets: 292 10*3/uL (ref 150–400)
RBC: 4.29 MIL/uL (ref 3.87–5.11)

## 2012-06-03 LAB — POCT I-STAT TROPONIN I

## 2012-06-03 LAB — MAGNESIUM: Magnesium: 1.9 mg/dL (ref 1.5–2.5)

## 2012-06-03 MED ORDER — ASPIRIN 81 MG PO CHEW
81.0000 mg | CHEWABLE_TABLET | Freq: Every day | ORAL | Status: DC
  Administered 2012-06-04 – 2012-06-06 (×3): 81 mg via ORAL
  Filled 2012-06-03 (×3): qty 1

## 2012-06-03 MED ORDER — ATENOLOL 50 MG PO TABS
50.0000 mg | ORAL_TABLET | Freq: Two times a day (BID) | ORAL | Status: DC
  Administered 2012-06-03: 50 mg via ORAL
  Filled 2012-06-03 (×3): qty 1

## 2012-06-03 MED ORDER — LORATADINE 10 MG PO TABS
10.0000 mg | ORAL_TABLET | Freq: Every day | ORAL | Status: DC
  Administered 2012-06-04 – 2012-06-06 (×3): 10 mg via ORAL
  Filled 2012-06-03 (×4): qty 1

## 2012-06-03 MED ORDER — FLUTICASONE PROPIONATE 50 MCG/ACT NA SUSP
1.0000 | Freq: Every day | NASAL | Status: DC
  Administered 2012-06-04 – 2012-06-05 (×2): 1 via NASAL
  Filled 2012-06-03 (×2): qty 16

## 2012-06-03 MED ORDER — IPRATROPIUM BROMIDE 0.02 % IN SOLN
0.5000 mg | Freq: Once | RESPIRATORY_TRACT | Status: AC
  Administered 2012-06-03: 0.5 mg via RESPIRATORY_TRACT
  Filled 2012-06-03: qty 2.5

## 2012-06-03 MED ORDER — ALBUTEROL SULFATE (5 MG/ML) 0.5% IN NEBU
2.5000 mg | INHALATION_SOLUTION | Freq: Once | RESPIRATORY_TRACT | Status: AC
  Administered 2012-06-03: 2.5 mg via RESPIRATORY_TRACT

## 2012-06-03 MED ORDER — NITROGLYCERIN 0.4 MG SL SUBL: 0.4000 mg | SUBLINGUAL_TABLET | SUBLINGUAL | Status: DC | PRN

## 2012-06-03 MED ORDER — IMIPRAMINE HCL 25 MG PO TABS
25.0000 mg | ORAL_TABLET | Freq: Every day | ORAL | Status: DC
  Administered 2012-06-03 – 2012-06-05 (×3): 25 mg via ORAL
  Filled 2012-06-03 (×4): qty 1

## 2012-06-03 MED ORDER — DOCUSATE SODIUM 100 MG PO CAPS
100.0000 mg | ORAL_CAPSULE | Freq: Two times a day (BID) | ORAL | Status: DC
  Administered 2012-06-03 – 2012-06-06 (×6): 100 mg via ORAL
  Filled 2012-06-03 (×8): qty 1

## 2012-06-03 MED ORDER — LEVALBUTEROL HCL 0.63 MG/3ML IN NEBU
0.6300 mg | INHALATION_SOLUTION | RESPIRATORY_TRACT | Status: DC | PRN
  Filled 2012-06-03: qty 3

## 2012-06-03 MED ORDER — METHYLPREDNISOLONE SODIUM SUCC 40 MG IJ SOLR
40.0000 mg | Freq: Four times a day (QID) | INTRAMUSCULAR | Status: DC
  Administered 2012-06-03 – 2012-06-05 (×8): 40 mg via INTRAVENOUS
  Filled 2012-06-03 (×10): qty 1

## 2012-06-03 MED ORDER — SODIUM CHLORIDE 0.9 % IJ SOLN: 3.0000 mL | INTRAMUSCULAR | Status: DC | PRN

## 2012-06-03 MED ORDER — ACETAMINOPHEN 650 MG RE SUPP: 650.0000 mg | Freq: Four times a day (QID) | RECTAL | Status: DC | PRN

## 2012-06-03 MED ORDER — ATORVASTATIN CALCIUM 10 MG PO TABS: 10.0000 mg | ORAL_TABLET | Freq: Every day | ORAL | Status: DC

## 2012-06-03 MED ORDER — GUAIFENESIN ER 600 MG PO TB12
600.0000 mg | ORAL_TABLET | Freq: Two times a day (BID) | ORAL | Status: DC
  Administered 2012-06-04 – 2012-06-06 (×4): 600 mg via ORAL
  Filled 2012-06-03 (×7): qty 1

## 2012-06-03 MED ORDER — FLUCONAZOLE 100 MG PO TABS
100.0000 mg | ORAL_TABLET | Freq: Every day | ORAL | Status: DC
  Administered 2012-06-04 – 2012-06-06 (×3): 100 mg via ORAL
  Filled 2012-06-03 (×4): qty 1

## 2012-06-03 MED ORDER — ALBUTEROL SULFATE (5 MG/ML) 0.5% IN NEBU
5.0000 mg | INHALATION_SOLUTION | Freq: Once | RESPIRATORY_TRACT | Status: AC
  Administered 2012-06-03: 5 mg via RESPIRATORY_TRACT
  Filled 2012-06-03: qty 1

## 2012-06-03 MED ORDER — GABAPENTIN 100 MG PO CAPS
200.0000 mg | ORAL_CAPSULE | Freq: Every day | ORAL | Status: DC
  Administered 2012-06-03 – 2012-06-05 (×3): 200 mg via ORAL
  Filled 2012-06-03 (×4): qty 2

## 2012-06-03 MED ORDER — IPRATROPIUM BROMIDE 0.02 % IN SOLN
0.5000 mg | RESPIRATORY_TRACT | Status: DC
  Administered 2012-06-03 – 2012-06-04 (×2): 0.5 mg via RESPIRATORY_TRACT
  Filled 2012-06-03 (×2): qty 2.5

## 2012-06-03 MED ORDER — SODIUM CHLORIDE 0.9 % IJ SOLN
3.0000 mL | Freq: Two times a day (BID) | INTRAMUSCULAR | Status: DC
  Administered 2012-06-03: 3 mL via INTRAVENOUS

## 2012-06-03 MED ORDER — TRAZODONE HCL 100 MG PO TABS
100.0000 mg | ORAL_TABLET | Freq: Every day | ORAL | Status: DC
  Administered 2012-06-03 – 2012-06-05 (×3): 100 mg via ORAL
  Filled 2012-06-03 (×4): qty 1

## 2012-06-03 MED ORDER — ROSUVASTATIN CALCIUM 10 MG PO TABS
10.0000 mg | ORAL_TABLET | Freq: Every day | ORAL | Status: DC
  Administered 2012-06-04 – 2012-06-05 (×2): 10 mg via ORAL
  Filled 2012-06-03 (×3): qty 1

## 2012-06-03 MED ORDER — METHYLPREDNISOLONE SODIUM SUCC 125 MG IJ SOLR
125.0000 mg | Freq: Once | INTRAMUSCULAR | Status: AC
  Administered 2012-06-03: 125 mg via INTRAVENOUS
  Filled 2012-06-03: qty 2

## 2012-06-03 MED ORDER — ONDANSETRON HCL 4 MG PO TABS: 4.0000 mg | ORAL_TABLET | Freq: Four times a day (QID) | ORAL | Status: DC | PRN

## 2012-06-03 MED ORDER — POLYETHYLENE GLYCOL 3350 17 G PO PACK
17.0000 g | PACK | Freq: Two times a day (BID) | ORAL | Status: DC
  Administered 2012-06-03 – 2012-06-06 (×4): 17 g via ORAL
  Filled 2012-06-03 (×7): qty 1

## 2012-06-03 MED ORDER — GABAPENTIN 100 MG PO CAPS: 100.0000 mg | ORAL_CAPSULE | ORAL | Status: DC

## 2012-06-03 MED ORDER — AMLODIPINE BESYLATE 5 MG PO TABS
5.0000 mg | ORAL_TABLET | Freq: Every day | ORAL | Status: DC
  Filled 2012-06-03 (×2): qty 1

## 2012-06-03 MED ORDER — LEVALBUTEROL HCL 0.63 MG/3ML IN NEBU
0.6300 mg | INHALATION_SOLUTION | RESPIRATORY_TRACT | Status: DC
  Administered 2012-06-03 – 2012-06-04 (×2): 0.63 mg via RESPIRATORY_TRACT
  Filled 2012-06-03 (×5): qty 3

## 2012-06-03 MED ORDER — PANTOPRAZOLE SODIUM 40 MG PO TBEC
40.0000 mg | DELAYED_RELEASE_TABLET | Freq: Every day | ORAL | Status: DC
  Administered 2012-06-04 – 2012-06-06 (×3): 40 mg via ORAL
  Filled 2012-06-03 (×3): qty 1

## 2012-06-03 MED ORDER — LEVOFLOXACIN IN D5W 500 MG/100ML IV SOLN
500.0000 mg | INTRAVENOUS | Status: DC
  Administered 2012-06-03 – 2012-06-04 (×2): 500 mg via INTRAVENOUS
  Filled 2012-06-03: qty 100

## 2012-06-03 MED ORDER — ADULT MULTIVITAMIN W/MINERALS CH
1.0000 | ORAL_TABLET | Freq: Every day | ORAL | Status: DC
  Administered 2012-06-04 – 2012-06-06 (×3): 1 via ORAL
  Filled 2012-06-03 (×3): qty 1

## 2012-06-03 MED ORDER — ENOXAPARIN SODIUM 40 MG/0.4ML ~~LOC~~ SOLN
40.0000 mg | SUBCUTANEOUS | Status: DC
  Administered 2012-06-04 – 2012-06-05 (×2): 40 mg via SUBCUTANEOUS
  Filled 2012-06-03 (×4): qty 0.4

## 2012-06-03 MED ORDER — DULOXETINE HCL 60 MG PO CPEP
60.0000 mg | ORAL_CAPSULE | Freq: Two times a day (BID) | ORAL | Status: DC
  Administered 2012-06-03 – 2012-06-06 (×6): 60 mg via ORAL
  Filled 2012-06-03 (×7): qty 1

## 2012-06-03 MED ORDER — LEVOFLOXACIN IN D5W 500 MG/100ML IV SOLN
INTRAVENOUS | Status: AC
  Filled 2012-06-03: qty 100

## 2012-06-03 MED ORDER — SODIUM CHLORIDE 0.9 % IV SOLN: 250.0000 mL | INTRAVENOUS | Status: DC | PRN

## 2012-06-03 MED ORDER — VITAMIN D 1000 UNITS PO TABS
1000.0000 [IU] | ORAL_TABLET | Freq: Every day | ORAL | Status: DC
  Administered 2012-06-04 – 2012-06-06 (×3): 1000 [IU] via ORAL
  Filled 2012-06-03 (×4): qty 1

## 2012-06-03 MED ORDER — ESTROGENS CONJUGATED 0.9 MG PO TABS
0.9000 mg | ORAL_TABLET | Freq: Every day | ORAL | Status: DC
  Administered 2012-06-04 – 2012-06-06 (×3): 0.9 mg via ORAL
  Filled 2012-06-03 (×3): qty 1

## 2012-06-03 MED ORDER — ONDANSETRON HCL 4 MG/2ML IJ SOLN
4.0000 mg | Freq: Four times a day (QID) | INTRAMUSCULAR | Status: DC | PRN
  Administered 2012-06-03: 4 mg via INTRAVENOUS
  Filled 2012-06-03: qty 2

## 2012-06-03 MED ORDER — FUROSEMIDE 20 MG PO TABS
20.0000 mg | ORAL_TABLET | Freq: Every day | ORAL | Status: DC
  Filled 2012-06-03: qty 1

## 2012-06-03 MED ORDER — GABAPENTIN 100 MG PO CAPS
100.0000 mg | ORAL_CAPSULE | Freq: Every day | ORAL | Status: DC
  Administered 2012-06-04 – 2012-06-06 (×3): 100 mg via ORAL
  Filled 2012-06-03 (×3): qty 1

## 2012-06-03 MED ORDER — SODIUM CHLORIDE 0.9 % IJ SOLN
3.0000 mL | Freq: Two times a day (BID) | INTRAMUSCULAR | Status: DC
  Administered 2012-06-03 – 2012-06-06 (×4): 3 mL via INTRAVENOUS

## 2012-06-03 MED ORDER — METHYLPREDNISOLONE SODIUM SUCC 40 MG IJ SOLR
INTRAMUSCULAR | Status: AC
  Filled 2012-06-03: qty 1

## 2012-06-03 MED ORDER — ACETAMINOPHEN 325 MG PO TABS
650.0000 mg | ORAL_TABLET | Freq: Four times a day (QID) | ORAL | Status: DC | PRN
  Administered 2012-06-04 – 2012-06-05 (×5): 650 mg via ORAL
  Filled 2012-06-03 (×5): qty 2

## 2012-06-03 NOTE — Progress Notes (Signed)
  Subjective:    Patient ID: [name], female    DOB: [birth year], 66 y.o.   MRN: [id number]  HPI SOB/Cough- pt seen on 6/4 and dx'd w/ bronchitis.  On 6/7, called and reported cough was no better.  Switched to Avelox and had normal CXR w/ exception of chronic COPD changes.  Has 1 day of avelox remaining to complete 5 day course.  Pt reports feeling 'terrible' since seeing Dr Laury Axon- not worsening but not improving.  Cough is intermittently productive but most of the time 'it's like something's stuck right there (between breasts) and it hurts all the way through to my back'.  + chills, diffuse body aches (has hx of fibromyalgia).  'i hurt all over'.  Subjective fever.  + SOB- using Qvar and Proair.  No N/V/D.  + sick contacts.  Arrived in office w/ O2 sat of 84%.  Placed on O2 via [place] and sats improved to 95% on 4 L.  O2 sats down to 89-90% off O2.  Has been in and out of NH w/ her mother.  Pt recently had hair permed and reports she has had hx of similar rxn to cleaning chemicals.  + ear pain, sinus pain.  Breast drainage- sxs started 2 weeks ago.  Discussed this w/ Dr Laury Axon and has mammo pending for when she feels better.  Unable to have study done while coughing.   Review of Systems For ROS see HPI     Objective:   Physical Exam  Vitals reviewed. Constitutional: She is oriented to person, place, and time. She appears distressed (obviously SOB, uncomfortable).  HENT:  Head: Normocephalic and atraumatic.  Nose: Nose normal.  Mouth/Throat: Oropharynx is clear and moist.       TMs normal bilaterally + TTP over both frontal and maxillary sinuses  Neck: Neck supple.  Cardiovascular: Normal rate and regular rhythm.   Pulmonary/Chest: She has wheezes (diffuse inspiratory and expiratory wheezes w/ prolonged expiration). She has rales.       + hacking cough  Musculoskeletal: She exhibits no edema.  Lymphadenopathy:    She has no cervical adenopathy.  Neurological: She is alert and oriented to  person, place, and time.  Skin: Skin is warm and dry. There is pallor.          Assessment & Plan:

## 2012-06-03 NOTE — Assessment & Plan Note (Signed)
New.  Pt's sxs consistent w/ severe COPD exacerbation despite use of Qvar, albuterol, and Avelox.  Pt hypoxic in office- sats improve w/ O2 via nasal cannula but drop back to 84% upon discontinuation.  These #s are at rest- no exertion.  Due to lack of response to duoneb, outpt abx, appropriate outpt inhaler txs- will call EMS for transport to hospital for hypoxia.  Pt and husband are in agreement.

## 2012-06-03 NOTE — ED Notes (Signed)
Per ems: pt was dx with bronchitis on the June 4. Pt was abx cont to have sob and called pcp they where unable to see pt until today. ems called to pcp pt's o2 sat's 80's. Dub neb given to pt o2 sats 95%. Pt has non-productive cough. Pt does not wear 02 at home

## 2012-06-03 NOTE — ED Notes (Signed)
Report called. Will take patient up after 7pm

## 2012-06-03 NOTE — ED Notes (Signed)
O2 placed back on pt. No c/o sob.

## 2012-06-03 NOTE — H&P (Signed)
PCP:   Loreen Freud, DO, DO   Chief Complaint:  Productive cough, shortness of breath and wheeze.  HPI: This is a 66 year old female, with known history of COPD, smoker, HTN, GERD, depression, tachyarrhythmia, non-obstructive CAD, per cath 07/2010, s/p Hysterectomy, s/p Throat cyst removal, s/p skin cancer removal in past, presenting with above symptoms. Apparently, patient has been having trouble with shortness of breath and congestion since 05/24/12, when which she associates with inadvertently inhaling some chemicals, while having her hair permed. Since then she has not felt quite right, with sinus/nasal congestion, a non-productive cough and wheeze. She saw her primary care doctor on 05/28/12 and was prescribed inhalers and Azithromycin, which she utilized faithfully, without improvement. Patient called the PMD's office again on 05/31/12, a CXR was arranged, which showed emphysematous changes, but no acute disease. From 06/01/12-06/02/12, she has got progressively worse, with exertional dyspnea and persistent cough, subjective fever, and has had chills. She had trouble even just speaking. She called her PCP this morning, was seen in the office today, was found to have low Oxygen saturation of 83%, treated with Albuterol nebulizers x 2, then sent to ED via EMS. In the ED, she received 2 more nebulizer treatments, following which she coughed up greenish/brownish phlegm.    Allergies:   Allergies  Allergen Reactions  . Clindamycin Hcl Shortness Of Breath and Rash  . Penicillins Anaphylaxis  . Prednisone Shortness Of Breath  . Codeine Hives, Itching and Other (See Comments)    headache  . Pentazocine Lactate   . Pneumococcal Vaccine Polyvalent     REACTION: redness, swelling, and hives at injection site      Past Medical History  Diagnosis Date  . GERD (gastroesophageal reflux disease)   . Arthritis   . Asthma   . Skin cancer   . Hypertension   . Migraine   . History of colonic polyps   .  Hypothyroidism   . Bronchitis     chronic  . Irregular heartbeat   . CAD (coronary artery disease)     mild non-obstructive cad    Past Surgical History  Procedure Date  . Appendectomy   . Tonsillectomy   . Breast biopsy   . Skin cancer removal   . Vocal cord cyst removal   . Nasal sinus surgery   . Right ankle   . Right hand surgery   . Abdominal hysterectomy     Prior to Admission medications   Medication Sig Start Date End Date Taking? Authorizing Provider  albuterol (PROVENTIL HFA;VENTOLIN HFA) 108 (90 BASE) MCG/ACT inhaler Inhale 2 puffs into the lungs every 4 (four) hours as needed. 05/28/12  Yes Yvonne R Lowne, DO  amLODipine (NORVASC) 5 MG tablet Take 5 mg by mouth daily.   Yes Historical Provider, MD  amLODipine (NORVASC) 5 MG tablet  06/01/12 06/03/12 Yes Yvonne R Lowne, DO  aspirin 81 MG tablet Take 81 mg by mouth daily.     Yes Historical Provider, MD  atenolol (TENORMIN) 50 MG tablet Take 1 tablet (50 mg total) by mouth 2 (two) times daily. 04/29/12  Yes Grayling Congress Lowne, DO  B Complex Vitamins (B-COMPLEX/B-12 PO) Take 1 tablet by mouth daily.     Yes Historical Provider, MD  beclomethasone (QVAR) 40 MCG/ACT inhaler Inhale 2 puffs into the lungs 2 (two) times daily. 05/28/12 05/28/13 Yes Yvonne R Lowne, DO  Calcium Carb-Cholecalciferol (CALCIUM 1000 + D PO) Take 2 tablets by mouth daily.     Yes  Historical Provider, MD  Cholecalciferol (VITAMIN D) 1000 UNITS capsule Take 1,000 Units by mouth daily.     Yes Historical Provider, MD  cyclobenzaprine (FLEXERIL) 10 MG tablet Take 1 tablet (10 mg total) by mouth every 8 (eight) hours as needed for muscle spasms. 08/15/11 08/14/12 Yes Yvonne R Lowne, DO  desloratadine (CLARINEX) 5 MG tablet Take 5 mg by mouth daily.     Yes Historical Provider, MD  Docusate Calcium (STOOL SOFTENER PO) Take by mouth. As needed    Yes Historical Provider, MD  DULoxetine (CYMBALTA) 60 MG capsule Take 1 capsule (60 mg total) by mouth 2 (two) times daily.  01/19/12  Yes Lelon Perla, DO  esomeprazole (NEXIUM) 40 MG capsule Take 1 capsule 30 min before breakfast. Lot# [id number] Exp date: 03/2014 07/19/11  Yes Meredith Pel, NP  estrogens, conjugated, (PREMARIN) 0.9 MG tablet 1 po qd 08/15/11  Yes Yvonne R Lowne, DO  furosemide (LASIX) 20 MG tablet Take 1 tablet (20 mg total) by mouth daily. 04/08/12 04/08/13 Yes Kathleene Hazel, MD  gabapentin (NEURONTIN) 100 MG capsule Take 100-200 mg by mouth See admin instructions. Take one tab in am and 2 tabs in pm 08/15/11  Yes Yvonne R Lowne, DO  ibuprofen (ADVIL,MOTRIN) 200 MG tablet Take 800 mg by mouth every 6 (six) hours as needed. For pain   Yes Historical Provider, MD  imipramine (TOFRANIL) 25 MG tablet Take 1 tablet (25 mg total) by mouth at bedtime. 11/23/11  Yes Yvonne R Lowne, DO  mometasone (NASONEX) 50 MCG/ACT nasal spray 2 sprays by Nasal route daily.     Yes Historical Provider, MD  moxifloxacin (AVELOX) 400 MG tablet Take 400 mg by mouth daily.   Yes Historical Provider, MD  Multiple Vitamin (MULTIVITAMIN) tablet Take 1 tablet by mouth daily.    Yes Historical Provider, MD  nitroGLYCERIN (NITROSTAT) 0.4 MG SL tablet Place 0.4 mg under the tongue every 5 (five) minutes as needed.     Yes Historical Provider, MD  polyethylene glycol (MIRALAX / GLYCOLAX) packet Take 17 g by mouth daily.    Yes Historical Provider, MD  Probiotic Product (PA PROBIOTIC COMPLEX) TABS Take 1 tablet by mouth daily.     Yes Historical Provider, MD  rosuvastatin (CRESTOR) 10 MG tablet Take 1 tablet (10 mg total) by mouth daily. 04/08/12  Yes Kathleene Hazel, MD  traMADol Janean Sark) 50 MG tablet 1-2 po every 6 hours prn 08/15/11  Yes Yvonne R Lowne, DO  traZODone (DESYREL) 100 MG tablet Take 1 tablet (100 mg total) by mouth at bedtime. 08/15/11  Yes Lelon Perla, DO    Social History:  reports that she quit smoking 7 days ago. She has never used smokeless tobacco. She reports that she drinks alcohol. She reports  that she does not use illicit drugs.  Family History  Problem Relation Age of Onset  . Rectal cancer Mother   . Cancer Mother     rectal  . Irritable bowel syndrome Son   . Irritable bowel syndrome Daughter   . Heart disease Other   . Arthritis Other   . Cancer Other     breast cancer 1st degree relative <50, lung  . Diabetes Other     1st degree relative   . Hyperlipidemia Other   . Hypertension Other   . Osteoporosis Other   . Coronary artery disease Other 64    Review of Systems:  As per HPI and chief complaint. Patent denies fatigue,  diminished appetite, weight loss, headache, blurred vision, difficulty in speaking, dysphagia, chest pain, orthopnea, paroxysmal nocturnal dyspnea, nausea, diaphoresis, abdominal pain, vomiting, diarrhea, hematemesis, melena, dysuria, nocturia, urinary frequency, hematochezia, lower extremity swelling, pain, or redness. Has trouble with constipation, and last BM was 10 days ago. The rest of the systems review is negative.  Physical Exam:  General:  Patient does not appear to be in obvious acute distress at the time of this evaluation, after nebulizer treatment, administered in the ED. Alert, communicative, fully oriented, talking in complete sentences, not short of breath at rest.  HEENT:  No clinical pallor, no jaundice, no conjunctival injection or discharge. Patient has oral thrush, and hydration status is fair. NECK:  Supple, JVP not seen, no carotid bruits, no palpable lymphadenopathy, no palpable goiter. CHEST:  Clinically clear to auscultation, few expiratory rhonchi, no crackles. HEART:  Sounds 1 and 2 heard, normal, regular, no murmurs. ABDOMEN:  Full, soft, non-tender, no palpable organomegaly, no palpable masses, normal bowel sounds. GENITALIA:  Not examined. LOWER EXTREMITIES:  No pitting edema, palpable peripheral pulses. MUSCULOSKELETAL SYSTEM:  Generalized osteoarthritic changes, otherwise, normal. CENTRAL NERVOUS SYSTEM:  No focal  neurologic deficit on gross examination.  Labs on Admission:  Results for orders placed during the hospital encounter of 06/03/12 (from the past 48 hour(s))  PRO B NATRIURETIC PEPTIDE     Status: Abnormal   Collection Time   06/03/12  1:05 PM      Component Value Range Comment   Pro B Natriuretic peptide (BNP) 502.6 (*) 0 - 125 (pg/mL)   BASIC METABOLIC PANEL     Status: Abnormal   Collection Time   06/03/12  1:05 PM      Component Value Range Comment   Sodium 137  135 - 145 (mEq/L)    Potassium 3.5  3.5 - 5.1 (mEq/L)    Chloride 95 (*) 96 - 112 (mEq/L)    CO2 29  19 - 32 (mEq/L)    Glucose, Bld 97  70 - 99 (mg/dL)    BUN 15  6 - 23 (mg/dL)    Creatinine, Ser 1.61  0.50 - 1.10 (mg/dL)    Calcium 9.0  8.4 - 10.5 (mg/dL)    GFR calc non Af Amer 73 (*) >90 (mL/min)    GFR calc Af Amer 85 (*) >90 (mL/min)   CBC     Status: Normal   Collection Time   06/03/12  1:05 PM      Component Value Range Comment   WBC 10.4  4.0 - 10.5 (K/uL)    RBC 4.29  3.87 - 5.11 (MIL/uL)    Hemoglobin 13.1  12.0 - 15.0 (g/dL)    HCT 09.6  04.5 - 40.9 (%)    MCV 91.8  78.0 - 100.0 (fL)    MCH 30.5  26.0 - 34.0 (pg)    MCHC 33.2  30.0 - 36.0 (g/dL)    RDW 81.1  91.4 - 78.2 (%)    Platelets 292  150 - 400 (K/uL)   POCT I-STAT TROPONIN I     Status: Normal   Collection Time   06/03/12  1:13 PM      Component Value Range Comment   Troponin i, poc 0.00  0.00 - 0.08 (ng/mL)    Comment 3              Radiological Exams on Admission: *RADIOLOGY REPORT*  Clinical Data: Shortness of breath, cough and weakness.  CHEST - 2 VIEW  Comparison:  05/31/2012.  Findings: Trachea is midline. Heart size normal. Biapical pleural  parenchymal scarring. Lungs are hyperinflated. No pleural fluid.  IMPRESSION:  COPD without acute finding.  Original Report Authenticated By: Reyes Ivan, M.D.   Assessment/Plan Active Problems:  1. COPD exacerbation: Patient presented with progressive shortness of breath/Wheeze,  against a background of exposure to inhaled irritants, and tobacco use. CXR confirms emphysematous changes, and she now has production of purulent phlegm. Clearly, she has an infective exacerbation of her COPD, and has already responded to initial bronchodilator treatment. We shall admit, manage with bronchodilators, steroids, mucolytics, and oxygen supplementation.  2. Acute bronchitis: This is the likely culprit for patient's acute worsening, although inhaled irritants may have initiated her exacerbation. We shall manage with iv Levaquin, as well as supportive measures.  3. Acute hypoxic respiratory failure: Patient;s Oxygen saturation was reportedly 83% in Dr Ernst Spell office. This is due to a combinmnation of acute bronchitis/COPD exacerbation. Patient has improved somewhat however, and is currently saturating at 93%-95% on 2L.  4. GERD (gastroesophageal reflux disease): Not problematic at this time. Will manage with PPI.  5. CAD (coronary artery disease): Known history of non-obstructive CAD, per cath of 07/2010. Stable.  6. Tobacco abuse: unfortunately, patient continues to smoke, although she claims not to have had any cigarettes, for about a week. She has been counseled, and states "I am done with it". She declined Nicoderm CQ.  Further management will depend on clinical course.   Time Spent on Admission: 1 hour.  Diavian Furgason,CHRISTOPHER 06/03/2012, 4:00 PM

## 2012-06-03 NOTE — ED Provider Notes (Signed)
History     CSN: [id number]  Arrival date & time 06/03/12  1156   First MD Initiated Contact with Patient 06/03/12 1219      Chief Complaint  Patient presents with  . Shortness of Breath    HPI Pt has been having trouble with shortness of breath and congestion since at least June 4th.  She had sinus congestion and nasal congestion.  She saw her primary care doctor and was told it would take some time to get better.  Pt called the office again a few days later because she was not getting better.  She was having trouble just walking up the stairs.  She has continued to have a cough.  She felt like this whole weekend she still has had trouble even just speaking.  She had an outpatient xray on the weekend.  She saw her PCP in the office today and her hear oxygen level was low.  Pt was given albuterol treatments which helped but her oxygen saturation dropped again.  Pt has non obstructive CAD.  She has had pressure in her chest associated with this coughing. Patient is not on home oxygen  Past Medical History  Diagnosis Date  . GERD (gastroesophageal reflux disease)   . Arthritis   . Asthma   . Skin cancer   . Hypertension   . Migraine   . History of colonic polyps   . Hypothyroidism   . Bronchitis     chronic  . Irregular heartbeat   . CAD (coronary artery disease)     mild non-obstructive cad    Past Surgical History  Procedure Date  . Appendectomy   . Tonsillectomy   . Breast biopsy   . Skin cancer removal   . Vocal cord cyst removal   . Nasal sinus surgery   . Right ankle   . Right hand surgery   . Abdominal hysterectomy     Family History  Problem Relation Age of Onset  . Rectal cancer Mother   . Cancer Mother     rectal  . Irritable bowel syndrome Son   . Irritable bowel syndrome Daughter   . Heart disease Other   . Arthritis Other   . Cancer Other     breast cancer 1st degree relative <50, lung  . Diabetes Other     1st degree relative   . Hyperlipidemia  Other   . Hypertension Other   . Osteoporosis Other   . Coronary artery disease Other 64    History  Substance Use Topics  . Smoking status: Former Games developer  . Smokeless tobacco: Never Used  . Alcohol Use: Yes    OB History    Grav Para Term Preterm Abortions TAB SAB Ect Mult Living                  Review of Systems  All other systems reviewed and are negative.    Allergies  Clindamycin hcl; Prednisone; Codeine; Penicillins; Pentazocine lactate; and Pneumococcal vaccine polyvalent  Home Medications   Current Outpatient Rx  Name Route Sig Dispense Refill  . ALBUTEROL SULFATE HFA 108 (90 BASE) MCG/ACT IN AERS  2 puffs qid prn  --As directed 1 Inhaler 2  . AMLODIPINE BESYLATE 5 MG PO TABS  TAKE 1 TABLET (5 MG TOTAL) BY MOUTH DAILY. 90 tablet 1  . ASPIRIN 81 MG PO TABS Oral Take 81 mg by mouth daily.      . ATENOLOL 50 MG PO TABS Oral Take  1 tablet (50 mg total) by mouth 2 (two) times daily. 180 tablet 0    Office visit due now  . AZITHROMYCIN 250 MG PO TABS  As directed    . B-COMPLEX/B-12 PO Oral Take 1 tablet by mouth daily.      . BECLOMETHASONE DIPROPIONATE 40 MCG/ACT IN AERS Inhalation Inhale 2 puffs into the lungs 2 (two) times daily. 1 Inhaler 12  . CALCIUM 1000 + D PO Oral Take 2 tablets by mouth daily.      Marland Kitchen VITAMIN D 1000 UNITS PO CAPS Oral Take 1,000 Units by mouth daily.      . CYCLOBENZAPRINE HCL 10 MG PO TABS Oral Take 1 tablet (10 mg total) by mouth every 8 (eight) hours as needed for muscle spasms. 30 tablet 1  . DESLORATADINE 5 MG PO TABS Oral Take 5 mg by mouth daily.      . STOOL SOFTENER PO Oral Take by mouth. As needed     . DULOXETINE HCL 60 MG PO CPEP Oral Take 1 capsule (60 mg total) by mouth 2 (two) times daily. 90 capsule 3  . ESOMEPRAZOLE MAGNESIUM 40 MG PO CPDR  Take 1 capsule 30 min before breakfast. Lot# [id number] Exp date: 03/2014 30 capsule 0  . ESTROGENS CONJUGATED 0.9 MG PO TABS  1 po qd 90 tablet 3  . FUROSEMIDE 20 MG PO TABS Oral Take 1  tablet (20 mg total) by mouth daily. 90 tablet 3  . GABAPENTIN 100 MG PO CAPS Oral Take 100 mg by mouth 4 (four) times daily.    . IMIPRAMINE HCL 25 MG PO TABS Oral Take 1 tablet (25 mg total) by mouth at bedtime. 90 tablet 0  . MOMETASONE FUROATE 50 MCG/ACT NA SUSP Nasal 2 sprays by Nasal route daily.      Marland Kitchen MOXIFLOXACIN HCL 400 MG PO TABS Oral Take 1 tablet (400 mg total) by mouth daily. 5 tablet 0  . ONE-DAILY MULTI VITAMINS PO TABS Oral Take 1 tablet by mouth daily.      Marland Kitchen MUPIROCIN CALCIUM 2 % NA OINT Nasal Place into the nose 2 (two) times daily. Use one-half of tube in each nostril twice daily for five (5) days. After application, press sides of nose together and gently massage.    Marland Kitchen NITROGLYCERIN 0.4 MG SL SUBL Sublingual Place 0.4 mg under the tongue every 5 (five) minutes as needed.      Marland Kitchen POLYETHYLENE GLYCOL 3350 PO PACK Oral Take 17 g by mouth daily.     Marland Kitchen PA PROBIOTIC COMPLEX PO TABS Oral Take 1 tablet by mouth daily.      Marland Kitchen ROSUVASTATIN CALCIUM 10 MG PO TABS Oral Take 1 tablet (10 mg total) by mouth daily. 90 tablet 3  . TRAMADOL HCL 50 MG PO TABS  1-2 po every 6 hours prn 60 tablet 0  . TRAZODONE HCL 100 MG PO TABS Oral Take 1 tablet (100 mg total) by mouth at bedtime. 90 tablet 3  . ZOSTER VACCINE LIVE 19400 UNT/0.65ML Marion SOLR Subcutaneous Inject 0.65 mLs into the skin once.        BP 92/42  Pulse 74  Temp(Src) 98.1 F (36.7 C) (Oral)  Resp 20  SpO2 92%  Physical Exam  Nursing note and vitals reviewed. Constitutional: No distress.       thin  HENT:  Head: Normocephalic and atraumatic.  Right Ear: External ear normal.  Left Ear: External ear normal.  Eyes: Conjunctivae are normal. Right eye  exhibits no discharge. Left eye exhibits no discharge. No scleral icterus.  Neck: Neck supple. No tracheal deviation present.  Cardiovascular: Normal rate, regular rhythm and intact distal pulses.   Pulmonary/Chest: Effort normal. No stridor. No respiratory distress. She has  wheezes. She has rhonchi. She has no rales.  Abdominal: Soft. Bowel sounds are normal. She exhibits no distension. There is no tenderness. There is no rebound and no guarding.  Musculoskeletal: She exhibits no edema and no tenderness.  Neurological: She is alert. She has normal strength. No sensory deficit. Cranial nerve deficit:  no gross defecits noted. She exhibits normal muscle tone. She displays no seizure activity. Coordination normal.  Skin: Skin is warm and dry. No rash noted.  Psychiatric: She has a normal mood and affect.    ED Course  Procedures (including critical care time)  Rate: 77  Rhythm: normal sinus rhythm  QRS Axis: normal  Intervals: normal  ST/T Wave abnormalities: nonspecific t wave changes lateral leads  Conduction Disutrbances:none  Old EKG Reviewed: changed, st depression new  Medications  moxifloxacin (AVELOX) 400 MG tablet (not administered)  amLODipine (NORVASC) 5 MG tablet (not administered)  ibuprofen (ADVIL,MOTRIN) 200 MG tablet (not administered)  albuterol (PROVENTIL HFA;VENTOLIN HFA) 108 (90 BASE) MCG/ACT inhaler (not administered)  amLODipine (NORVASC) 5 MG tablet (not administered)  albuterol (PROVENTIL) (5 MG/ML) 0.5% nebulizer solution 5 mg (5 mg Nebulization Given 06/03/12 1249)  ipratropium (ATROVENT) nebulizer solution 0.5 mg (0.5 mg Nebulization Given 06/03/12 1249)  methylPREDNISolone sodium succinate (SOLU-MEDROL) 125 mg/2 mL injection 125 mg (125 mg Intravenous Given 06/03/12 1303)    Labs Reviewed  PRO B NATRIURETIC PEPTIDE - Abnormal; Notable for the following:    Pro B Natriuretic peptide (BNP) 502.6 (*)    All other components within normal limits  BASIC METABOLIC PANEL - Abnormal; Notable for the following:    Chloride 95 (*)    GFR calc non Af Amer 73 (*)    GFR calc Af Amer 85 (*)    All other components within normal limits  CBC  POCT I-STAT TROPONIN I   Dg Chest 2 View  06/03/2012  *RADIOLOGY REPORT*  Clinical Data:  Shortness of breath, cough and weakness.  CHEST - 2 VIEW  Comparison: 05/31/2012.  Findings: Trachea is midline.  Heart size normal.  Biapical pleural parenchymal scarring.  Lungs are hyperinflated.  No pleural fluid.  IMPRESSION: COPD without acute finding.  Original Report Authenticated By: Reyes Ivan, M.D.     No diagnosis found.    MDM  Patient presents to emergency room with complaints of worsening shortness of breath. She has history of COPD.  On exam she is wheezing. Symptoms are consistent with a COPD exacerbation. Chest x-ray does not show any signs of pulmonary edema, pneumonia, pneumothorax or pleural effusion. She has an elevated BNP but I do not think the patient's abdomen acute congestive heart failure exacerbation. Considering her persistent symptoms and hypoxia I will consult the hospitalist for admission and further treatment.      Celene Kras, MD 06/03/12 [phone]

## 2012-06-03 NOTE — ED Notes (Signed)
YNW:[id number]> Expected date:<BR> Expected time:11:49 AM<BR> Means of arrival:<BR> Comments:<BR> M30 - 65yoF Wheezing, neb by ems

## 2012-06-04 DIAGNOSIS — I2510 Atherosclerotic heart disease of native coronary artery without angina pectoris: Secondary | ICD-10-CM

## 2012-06-04 DIAGNOSIS — J441 Chronic obstructive pulmonary disease with (acute) exacerbation: Secondary | ICD-10-CM

## 2012-06-04 DIAGNOSIS — B370 Candidal stomatitis: Secondary | ICD-10-CM

## 2012-06-04 DIAGNOSIS — J209 Acute bronchitis, unspecified: Secondary | ICD-10-CM

## 2012-06-04 LAB — COMPREHENSIVE METABOLIC PANEL
AST: 16 U/L (ref 0–37)
Albumin: 3 g/dL — ABNORMAL LOW (ref 3.5–5.2)
Alkaline Phosphatase: 100 U/L (ref 39–117)
BUN: 21 mg/dL (ref 6–23)
Calcium: 9.2 mg/dL (ref 8.4–10.5)
Chloride: 98 mEq/L (ref 96–112)
Creatinine, Ser: 0.79 mg/dL (ref 0.50–1.10)
Total Bilirubin: 0.3 mg/dL (ref 0.3–1.2)

## 2012-06-04 LAB — CBC
Hemoglobin: 12.2 g/dL (ref 12.0–15.0)
MCH: 30 pg (ref 26.0–34.0)
MCV: 91.6 fL (ref 78.0–100.0)
Platelets: 274 10*3/uL (ref 150–400)
RBC: 4.06 MIL/uL (ref 3.87–5.11)
RDW: 13.6 % (ref 11.5–15.5)
WBC: 9.3 10*3/uL (ref 4.0–10.5)

## 2012-06-04 MED ORDER — TRAMADOL HCL 50 MG PO TABS
25.0000 mg | ORAL_TABLET | Freq: Four times a day (QID) | ORAL | Status: DC | PRN
  Administered 2012-06-04 – 2012-06-05 (×5): 25 mg via ORAL
  Filled 2012-06-04 (×3): qty 1
  Filled 2012-06-04: qty 2

## 2012-06-04 MED ORDER — IPRATROPIUM BROMIDE 0.02 % IN SOLN
0.5000 mg | Freq: Four times a day (QID) | RESPIRATORY_TRACT | Status: DC
  Administered 2012-06-04 (×3): 0.5 mg via RESPIRATORY_TRACT
  Filled 2012-06-04 (×3): qty 2.5

## 2012-06-04 MED ORDER — BISACODYL 10 MG RE SUPP
10.0000 mg | Freq: Every day | RECTAL | Status: DC | PRN
  Administered 2012-06-04: 10 mg via RECTAL
  Filled 2012-06-04: qty 1

## 2012-06-04 MED ORDER — SODIUM CHLORIDE 0.9 % IV SOLN
INTRAVENOUS | Status: AC
  Administered 2012-06-04 – 2012-06-05 (×2): via INTRAVENOUS

## 2012-06-04 MED ORDER — LEVALBUTEROL HCL 0.63 MG/3ML IN NEBU
0.6300 mg | INHALATION_SOLUTION | Freq: Four times a day (QID) | RESPIRATORY_TRACT | Status: DC
  Administered 2012-06-04 (×3): 0.63 mg via RESPIRATORY_TRACT
  Filled 2012-06-04 (×9): qty 3

## 2012-06-04 MED ORDER — ENSURE COMPLETE PO LIQD
237.0000 mL | Freq: Every day | ORAL | Status: DC
  Administered 2012-06-04 – 2012-06-05 (×2): 237 mL via ORAL

## 2012-06-04 NOTE — Progress Notes (Signed)
Subjective: Feels better. Phlegm is tenacious, Wheeze is less.  Objective: Vital signs in last 24 hours: Temp:  [96.7 F (35.9 C)-98.1 F (36.7 C)] 97.3 F (36.3 C) (06/11 0501) Pulse Rate:  [67-81] 78  (06/11 1000) Resp:  [11-20] 14  (06/11 0501) BP: (92-102)/(42-62) 98/59 mmHg (06/11 1000) SpO2:  [86 %-95 %] 88 % (06/11 0803) Weight:  [50.9 kg (112 lb 3.4 oz)] 50.9 kg (112 lb 3.4 oz) (06/10 2020) Weight change:  Last BM Date: 04/23/12  Intake/Output from previous day: 06/10 0701 - 06/11 0700 In: 470 [P.O.:470] Out: -  Total I/O In: 243 [P.O.:240; I.V.:3] Out: 200 [Urine:200]   Physical Exam: General: Alert, communicative, fully oriented, talking in complete sentences, not short of breath at rest.  HEENT: No clinical pallor, no jaundice, no conjunctival injection or discharge. Patient has oral thrush, and hydration status is fair.  NECK: Supple, JVP not seen, no carotid bruits, no palpable lymphadenopathy, no palpable goiter.  CHEST: Clinically clear to auscultation, bilateral expiratory wheeze, no crackles.  HEART: Sounds 1 and 2 heard, normal, regular, no murmurs.  ABDOMEN: Full, soft, non-tender, no palpable organomegaly, no palpable masses, normal bowel sounds.  GENITALIA: Not examined.  LOWER EXTREMITIES: No pitting edema, palpable peripheral pulses.  MUSCULOSKELETAL SYSTEM: Generalized osteoarthritic changes, otherwise, normal.  CENTRAL NERVOUS SYSTEM: No focal neurologic deficit on gross examination.   Lab Results:  Basename 06/04/12 0440 06/03/12 1305  WBC 9.3 10.4  HGB 12.2 13.1  HCT 37.2 39.4  PLT 274 292    Basename 06/04/12 0440 06/03/12 1305  NA 139 137  K 3.7 3.5  CL 98 95*  CO2 30 29  GLUCOSE 176* 97  BUN 21 15  CREATININE 0.79 0.82  CALCIUM 9.2 9.0   No results found for this or any previous visit (from the past 240 hour(s)).   Studies/Results: Dg Chest 2 View  06/03/2012  *RADIOLOGY REPORT*  Clinical Data: Shortness of breath, cough and  weakness.  CHEST - 2 VIEW  Comparison: 05/31/2012.  Findings: Trachea is midline.  Heart size normal.  Biapical pleural parenchymal scarring.  Lungs are hyperinflated.  No pleural fluid.  IMPRESSION: COPD without acute finding.  Original Report Authenticated By: Reyes Ivan, M.D.    Medications: Scheduled Meds:   . albuterol  5 mg Nebulization Once  . albuterol  5 mg Nebulization Once  . amLODipine  5 mg Oral Daily  . aspirin  81 mg Oral Daily  . atenolol  50 mg Oral BID  . cholecalciferol  1,000 Units Oral Daily  . docusate sodium  100 mg Oral BID  . DULoxetine  60 mg Oral BID  . enoxaparin  40 mg Subcutaneous Q24H  . estrogens (conjugated)  0.9 mg Oral Daily  . feeding supplement  237 mL Oral QPC supper  . fluconazole  100 mg Oral Daily  . fluticasone  1 spray Each Nare Daily  . furosemide  20 mg Oral Daily  . gabapentin  100 mg Oral Daily  . gabapentin  200 mg Oral QHS  . guaiFENesin  600 mg Oral BID  . imipramine  25 mg Oral QHS  . ipratropium  0.5 mg Nebulization Once  . ipratropium  0.5 mg Nebulization Q6H  . levalbuterol  0.63 mg Nebulization Q6H  . levofloxacin      . levofloxacin (LEVAQUIN) IV  500 mg Intravenous Q24H  . loratadine  10 mg Oral Daily  . methylPREDNISolone sodium succinate  125 mg Intravenous Once  . methylPREDNISolone (  SOLU-MEDROL) injection  40 mg Intravenous Q6H  . methylPREDNISolone sodium succinate      . multivitamin with minerals  1 tablet Oral Daily  . pantoprazole  40 mg Oral Daily  . polyethylene glycol  17 g Oral BID  . rosuvastatin  10 mg Oral q1800  . sodium chloride  3 mL Intravenous Q12H  . sodium chloride  3 mL Intravenous Q12H  . traZODone  100 mg Oral QHS  . DISCONTD: atorvastatin  10 mg Oral q1800  . DISCONTD: gabapentin  100-200 mg Oral See admin instructions  . DISCONTD: ipratropium  0.5 mg Nebulization Q4H  . DISCONTD: levalbuterol  0.63 mg Nebulization Q4H   Continuous Infusions:  PRN Meds:.sodium chloride,  acetaminophen, acetaminophen, levalbuterol, nitroGLYCERIN, ondansetron (ZOFRAN) IV, ondansetron, sodium chloride  Assessment/Plan:  Active Problems:  1. COPD exacerbation: Patient presented with progressive shortness of breath/wheeze, against a background of exposure to inhaled irritants, and tobacco use. CXR confirms emphysematous changes, and she now has production of purulent phlegm. Clearly, she has an infective exacerbation of her COPD, and has already responded to initial bronchodilator treatment. We are managing with bronchodilators, steroids, mucolytics, and oxygen supplementation, and clinical improvement is already evident.  2. Acute bronchitis: This is the likely culprit for patient's acute worsening, although inhaled irritants may have initiated her exacerbation. Now on iv Levaquin day#2, as well as supportive measures.  3. Acute hypoxic respiratory failure: Patient;s Oxygen saturation was reportedly 83% in Dr Ernst Spell office. This is due to a combinmnation of acute bronchitis/COPD exacerbation. Patient has improved somewhat however, and is currently saturating at 93%-95% on 2-3L.  4. GERD (gastroesophageal reflux disease): Not problematic at this time. Managing with PPI.  5. CAD (coronary artery disease): Known history of non-obstructive CAD, per cath of 07/2010. Stable.  6. Tobacco abuse: unfortunately, patient continues to smoke, although she claims not to have had any cigarettes, for about a week. She has been counseled, and states "I am done with it". She declined Nicoderm CQ.  7. History of Tachyarrhythmia. Patient has known history of paroxysmal tachyarrhythmia, for which she is on Atenolol, under care of Dr Melene Muller, cardiologist. She has remained rate-controlled so far. 12-Lead EKG shows SR, but PR interval appears short. Perhaps, she has an accessory conductive pathway. Will inform DR Sanjuana Kava, of hospitalization. 8. HTN: BP is borderline low today, with SBP of 98-100  mmhg. Have placed Amlodipine, Atenolol and Lasix on hold, and as patient appears mildly dehydrated, will commence maintenance iv fluids. Perhaps,Atenolol can be recommenced on 06/06/12, in a once-daily dose. 9. Oral thrush: This is an incidental finding on physical examination at the time of presentation. Patient is now on day#2/10 Diflucan.     LOS: 1 day   [name],CHRISTOPHER 06/04/2012, 11:30 AM

## 2012-06-04 NOTE — Evaluation (Signed)
Physical Therapy Evaluation Patient Details Name: [name] MRN: [id number] DOB: Jun 19, 1946 Today's Date: 06/04/2012 Time: 1308-6578 PT Time Calculation (min): 35 min  PT Assessment / Plan / Recommendation Clinical Impression  66 yo female admitted with COPD exacerbation. Demonstrates mild general weakness and decreased activity tolerance. O2 sats: 80% on RA during activity in room, 88% on 3L O2 during ambulation in hallway.     PT Assessment  Patient needs continued PT services    Follow Up Recommendations  No PT follow up    Barriers to Discharge        lEquipment Recommendations  None recommended by PT    Recommendations for Other Services OT consult   Frequency Min 3X/week    Precautions / Restrictions Precautions Precautions: None Restrictions Weight Bearing Restrictions: No   Pertinent Vitals/Pain       Mobility  Transfers Transfers: Sit to Stand;Stand to Sit Sit to Stand: 7: Independent;From bed Stand to Sit: 7: Independent;To bed Ambulation/Gait Ambulation/Gait Assistance: 4: Min guard Ambulation Distance (Feet): 150 Feet Assistive device: Rolling walker Ambulation/Gait Assistance Details: Pt pushed dynamap while therapist pushed O2 carrier. Slow gait speed. Dsypnea 2-3/4 with ambulation. O2 sats 88% on 3 L O2 during ambulation. Gait Pattern: Step-through pattern    Exercises     PT Diagnosis: Difficulty walking;Generalized weakness  PT Problem List: Decreased strength;Decreased activity tolerance PT Treatment Interventions: Gait training;Stair training;Functional mobility training;Therapeutic activities;Therapeutic exercise;Patient/family education   PT Goals Acute Rehab PT Goals PT Goal Formulation: With patient Time For Goal Achievement: 06/11/12 Potential to Achieve Goals: Good Pt will Ambulate: >150 feet;with modified independence (with O2 sats > 90%) PT Goal: Ambulate - Progress: Goal set today Pt will Go Up / Down Stairs: Flight;with  supervision;with rail(s) PT Goal: Up/Down Stairs - Progress: Goal set today  Visit Information  Last PT Received On: 06/04/12 Assistance Needed: +1    Subjective Data  Subjective: "No wonder I couldn't do anything" Patient Stated Goal: Home   Prior Functioning  Home Living Lives With: Spouse Available Help at Discharge: Family Type of Home: House Home Access: Stairs to enter Home Layout: Two level;Bed/bath upstairs Alternate Level Stairs-Number of Steps: 1 flight Home Adaptive Equipment: None Prior Function Level of Independence: Independent Able to Take Stairs?: Yes Comments: having difficulty with ADLS and negotiating steps last few days/week Communication Communication: No difficulties    Cognition  Overall Cognitive Status: Appears within functional limits for tasks assessed/performed Arousal/Alertness: Awake/alert Orientation Level: Appears intact for tasks assessed Behavior During Session: [facility] for tasks performed    Extremity/Trunk Assessment Right Lower Extremity Assessment RLE ROM/Strength/Tone: WFL for tasks assessed RLE Coordination: WFL - gross motor Left Lower Extremity Assessment LLE ROM/Strength/Tone: WFL for tasks assessed LLE Coordination: WFL - gross motor Trunk Assessment Trunk Assessment: Normal   Balance    End of Session PT - End of Session Equipment Utilized During Treatment: Gait belt Activity Tolerance: Patient limited by fatigue Patient left: in bed;with call bell/phone within reach (sitting EOB)   Rebeca Alert [facility] 06/04/2012, 10:23 AM [phone]

## 2012-06-04 NOTE — Evaluation (Signed)
Occupational Therapy Evaluation Patient Details Name: [name] MRN: [id number] DOB: [birth year] Today's Date: 06/04/2012 Time: 7829-5621 OT Time Calculation (min): 47 min...searched for equipment x 8 minutes  OT Assessment / Plan / Recommendation Clinical Impression  This 66 year old female was admitted with COPD exacerbation.  She has had a decline in functional ADL activities for several months and is appropriate for skilled OT for energy conservation education and to reach a mod I level in acute with BADLs.      OT Assessment  Patient needs continued OT Services    Follow Up Recommendations  No OT follow up    Barriers to Discharge      Equipment Recommendations  None recommended by PT (pt will consider shower seat--will get herself, if she wants)    Recommendations for Other Services    Frequency  Min 2X/week    Precautions / Restrictions Precautions Precautions: None   Pertinent Vitals/Pain sats 91 -94% on 3 liters    ADL  Eating/Feeding: Simulated;Independent Where Assessed - Eating/Feeding: Chair Grooming: Simulated;Set up Where Assessed - Grooming: Supported standing Upper Body Bathing: Performed;Set up Where Assessed - Upper Body Bathing: Unsupported sitting Lower Body Bathing: Simulated;Supervision/safety Where Assessed - Lower Body Bathing: Unsupported sit to stand Upper Body Dressing: Simulated;Set up Where Assessed - Upper Body Dressing: Unsupported sitting Lower Body Dressing: Simulated;Supervision/safety Where Assessed - Lower Body Dressing: Sopported sit to stand Toilet Transfer: Performed;Min guard Toilet Transfer Method:  (ambulated) Acupuncturist: Comfort height toilet Toileting - Clothing Manipulation and Hygiene: Performed;Supervision/safety Where Assessed - Engineer, mining and Hygiene: Sit to stand from 3-in-1 or toilet Transfers/Ambulation Related to ADLs: pt held onto IV pole.  Wanted to walk in hall after eval.  Sats 91 -94 on 3 liters o2 ADL Comments: Educated on energy conservation and gave handout.  Also discussed with husband present  Pt felt she needed to hold onto IV pole for balance/support.  She was emotional/crying at times during OT eval, talking about how her mother will be discharged from [facility] and she is the one who cared for her in the past.     OT Diagnosis: Generalized weakness  OT Problem List: Decreased activity tolerance;Impaired balance (sitting and/or standing);Cardiopulmonary status limiting activity;Decreased knowledge of use of DME or AE;Decreased strength OT Treatment Interventions: Self-care/ADL training;Energy conservation;DME and/or AE instruction;Patient/family education;Balance training   OT Goals Acute Rehab OT Goals OT Goal Formulation: With patient Time For Goal Achievement: 06/18/12 Potential to Achieve Goals: Good ADL Goals Pt Will Transfer to Toilet: with modified independence;Ambulation;Regular height toilet (all aspects) ADL Goal: Toilet Transfer - Progress: Goal set today Pt Will Perform Tub/Shower Transfer: with modified independence;Shower seat with back;Ambulation;Shower transfer ADL Goal: Web designer - Progress: Goal set today Miscellaneous OT Goals Miscellaneous OT Goal #1: Pt will gather clothes at mod I level OT Goal: Miscellaneous Goal #1 - Progress: Goal set today Miscellaneous OT Goal #2: Pt will verbalize 3 energy conservation techniques and demonstrate pursed lip breathing as well as rest breaks during OT session OT Goal: Miscellaneous Goal #2 - Progress: Goal set today  Visit Information  Last OT Received On: 06/04/12 Assistance Needed: +1    Subjective Data  Subjective: "I haven't had the energy to take a shower" Patient Stated Goal: get stronger; back to normal   Prior Functioning  Home Living Lives With: Spouse Available Help at Discharge: Family Type of Home: House Home Access: Stairs to enter Home Layout: Two level;Bed/bath  upstairs Bathroom Shower/Tub: Walk-in shower  Bathroom Toilet: Standard Prior Function Level of Independence: Independent Able to Take Stairs?: Yes Vocation:  (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) Musician: No difficulties    Cognition  Overall Cognitive Status: Appears within functional limits for tasks assessed/performed Arousal/Alertness: Awake/alert    Extremity/Trunk Assessment Right Upper Extremity Assessment RUE ROM/Strength/Tone: WFL for tasks assessed Left Upper Extremity Assessment LUE ROM/Strength/Tone: WFL for tasks assessed   Mobility Bed Mobility Bed Mobility: Supine to Sit Supine to Sit: 7: Independent Transfers Sit to Stand: 7: Independent;From bed Stand to Sit: 7: Independent;To bed   Exercise    Balance    End of Session OT - End of Session Activity Tolerance: Patient limited by fatigue Patient left: in bed;with call bell/phone within reach;with family/visitor present Nurse Communication: Patient requests pain meds   Shaniqua Guillot 06/04/2012, 4:26 PM Marica Otter, OTR/L [phone] 06/04/2012

## 2012-06-04 NOTE — Progress Notes (Signed)
INITIAL ADULT NUTRITION ASSESSMENT Date: 06/04/2012   Time: 10:38 AM Reason for Assessment: Nutrition Risk for dysphagia  ASSESSMENT: Female 66 y.o.  Dx: Productive cough, shortness of breath and wheeze  Hx:  Past Medical History  Diagnosis Date  . GERD (gastroesophageal reflux disease)   . Arthritis   . Asthma   . Skin cancer   . Hypertension   . Migraine   . History of colonic polyps   . Hypothyroidism   . Bronchitis     chronic  . Irregular heartbeat   . CAD (coronary artery disease)     mild non-obstructive cad    Related Meds:  Scheduled Meds:   . albuterol  5 mg Nebulization Once  . albuterol  5 mg Nebulization Once  . amLODipine  5 mg Oral Daily  . aspirin  81 mg Oral Daily  . atenolol  50 mg Oral BID  . cholecalciferol  1,000 Units Oral Daily  . docusate sodium  100 mg Oral BID  . DULoxetine  60 mg Oral BID  . enoxaparin  40 mg Subcutaneous Q24H  . estrogens (conjugated)  0.9 mg Oral Daily  . fluconazole  100 mg Oral Daily  . fluticasone  1 spray Each Nare Daily  . furosemide  20 mg Oral Daily  . gabapentin  100 mg Oral Daily  . gabapentin  200 mg Oral QHS  . guaiFENesin  600 mg Oral BID  . imipramine  25 mg Oral QHS  . ipratropium  0.5 mg Nebulization Once  . ipratropium  0.5 mg Nebulization Q6H  . levalbuterol  0.63 mg Nebulization Q6H  . levofloxacin      . levofloxacin (LEVAQUIN) IV  500 mg Intravenous Q24H  . loratadine  10 mg Oral Daily  . methylPREDNISolone sodium succinate  125 mg Intravenous Once  . methylPREDNISolone (SOLU-MEDROL) injection  40 mg Intravenous Q6H  . methylPREDNISolone sodium succinate      . multivitamin with minerals  1 tablet Oral Daily  . pantoprazole  40 mg Oral Daily  . polyethylene glycol  17 g Oral BID  . rosuvastatin  10 mg Oral q1800  . sodium chloride  3 mL Intravenous Q12H  . sodium chloride  3 mL Intravenous Q12H  . traZODone  100 mg Oral QHS  . DISCONTD: atorvastatin  10 mg Oral q1800  . DISCONTD:  gabapentin  100-200 mg Oral See admin instructions  . DISCONTD: ipratropium  0.5 mg Nebulization Q4H  . DISCONTD: levalbuterol  0.63 mg Nebulization Q4H   Continuous Infusions:  PRN Meds:.sodium chloride, acetaminophen, acetaminophen, levalbuterol, nitroGLYCERIN, ondansetron (ZOFRAN) IV, ondansetron, sodium chloride   Ht: 5\' 5"  (165.1 cm)  Wt: 112 lb 3.4 oz (50.9 kg)  Ideal Wt: 56.8 kg % Ideal Wt: 89.6% Wt Readings from Last 10 Encounters:  06/03/12 112 lb 3.4 oz (50.9 kg)  06/03/12 113 lb (51.256 kg)  05/28/12 116 lb 12.8 oz (52.98 kg)  05/02/12 114 lb 6.4 oz (51.891 kg)  04/08/12 113 lb (51.256 kg)  11/08/11 115 lb (52.164 kg)  10/19/11 115 lb 9.6 oz (52.436 kg)  09/05/11 113 lb 9.6 oz (51.529 kg)  08/15/11 112 lb 9.6 oz (51.075 kg)  07/19/11 109 lb (49.442 kg)  *Weight is down 4 lb over 6 days. Down 3.4% from baseline.   Usual Wt: 117 lb. Per patient 6 weeks ago.   % Usual Wt: 95.7%  Body mass index is 18.67 kg/(m^2). (WNL)  Food/Nutrition Related Hx: Patient reports appetite has been poor  for the past year. She reported her PO intake has been about 25% at meals. PO intake documented 25-50% at meals. Patient reports problems with constipation and GERD. She reported her esophagus gets stretched a few times a year. She denies the need to receive softer foods. She stated she used to drink Ensure Nutrition supplement.   Labs:  CMP     Component Value Date/Time   NA 139 06/04/2012 0440   K 3.7 06/04/2012 0440   CL 98 06/04/2012 0440   CO2 30 06/04/2012 0440   GLUCOSE 176* 06/04/2012 0440   BUN 21 06/04/2012 0440   CREATININE 0.79 06/04/2012 0440   CALCIUM 9.2 06/04/2012 0440   PROT 7.1 06/04/2012 0440   ALBUMIN 3.0* 06/04/2012 0440   AST 16 06/04/2012 0440   ALT 10 06/04/2012 0440   ALKPHOS 100 06/04/2012 0440   BILITOT 0.3 06/04/2012 0440   GFRNONAA 85* 06/04/2012 0440   GFRAA >90 06/04/2012 0440    Intake/Output Summary (Last 24 hours) at 06/04/12 1045 Last data filed at  06/04/12 1000  Gross per 24 hour  Intake    713 ml  Output    200 ml  Net    513 ml     Diet Order: Cardiac  Supplements/Tube Feeding: none at this time.   IVF:    Estimated Nutritional Needs:   Kcal: 1420-1700 Protein: 68-85 grams Fluid: 1 ml per kcal intake  NUTRITION DIAGNOSIS: -Inadequate oral intake (NI-2.1).  Status: Ongoing  RELATED TO: poor appetite  AS EVIDENCE BY: PO intake 25-50% at meals and 4 lb weight loss over the past 6 days.   MONITORING/EVALUATION(Goals): PO intake, weights, labs 1. Minimize weight loss. 2. PO intake > 75% at meals and supplements  EDUCATION NEEDS: -Education needs addressed  INTERVENTION: 1. I have encouraged the patient to avoid spicy and acidic foods due to problems she reports with GERD.  2. Will order patient Chocolate Ensure once daily to increase caloric intake.  3. RD to follow for nutrition plan of care.   Dietitian [phone]  DOCUMENTATION CODES Per approved criteria  -Severe malnutrition in the context of acute illness or injury  *Patient meets malnutrition criteria for intake < or equal to 50% of estimated energy needs for > or equal to 5 days and unintentional weight loss of 3.4% over 6 days.   Iven Finn [facility] 06/04/2012, 10:38 AM

## 2012-06-04 NOTE — Care Management Note (Signed)
    Page 1 of 1   06/06/2012     12:36:57 PM   CARE MANAGEMENT NOTE 06/06/2012  Patient:  MARIETTE, COWLEY   Account Number:  [id number]  Date Initiated:  06/04/2012  Documentation initiated by:  Lanier Clam  Subjective/Objective Assessment:   ADMITTED W/SOB.     Action/Plan:   FROM HOME   Anticipated DC Date:  06/06/2012   Anticipated DC Plan:  HOME/SELF CARE      DC Planning Services  CM consult      Choice offered to / List presented to:  C-1 Patient   DME arranged  [facility]      DME agency  [facility]        Status of service:  Completed, signed off Medicare Important Message given?   (If response is "NO", the following Medicare IM given date fields will be blank) Date Medicare IM given:   Date Additional Medicare IM given:    Discharge Disposition:  HOME/SELF CARE  Per UR Regulation:  Reviewed for med. necessity/level of care/duration of stay  If discussed at Long Length of Stay Meetings, dates discussed:    Comments:  06/06/12 Haitham Dolinsky RN,BSN NCM [phone] PT-NA.QUALIFIED FOR HOME 02.[facility] CONTACTED LINDSAY/LUCRETIA(LIASON) AWARE OF HOME 02,& D/C.  06/04/12 Jsiah Menta RN,BSN NCM [phone]

## 2012-06-04 NOTE — Progress Notes (Signed)
Pt stated the breathing treatments make her feel flushed, nauseated and give her a headache afterwards.  Pt's tx stopped early due to these reasons.  Pt does not want these treatments on a scheduled basis.

## 2012-06-05 DIAGNOSIS — I2510 Atherosclerotic heart disease of native coronary artery without angina pectoris: Secondary | ICD-10-CM

## 2012-06-05 DIAGNOSIS — J441 Chronic obstructive pulmonary disease with (acute) exacerbation: Secondary | ICD-10-CM

## 2012-06-05 DIAGNOSIS — B370 Candidal stomatitis: Secondary | ICD-10-CM

## 2012-06-05 DIAGNOSIS — J209 Acute bronchitis, unspecified: Secondary | ICD-10-CM

## 2012-06-05 LAB — BASIC METABOLIC PANEL
CO2: 30 mEq/L (ref 19–32)
GFR calc Af Amer: >90 mL/min (ref >90)
GFR calc non Af Amer: 86 mL/min — ABNORMAL LOW (ref >90)
Glucose, Bld: 163 mg/dL — ABNORMAL HIGH (ref 70–99)
Potassium: 4 mEq/L (ref 3.5–5.1)
Sodium: 141 mEq/L (ref 135–145)

## 2012-06-05 LAB — CBC
Hemoglobin: 12 g/dL (ref 12.0–15.0)
MCHC: 32.8 g/dL (ref 30.0–36.0)
RBC: 3.97 MIL/uL (ref 3.87–5.11)
WBC: 20.8 10*3/uL — ABNORMAL HIGH (ref 4.0–10.5)

## 2012-06-05 MED ORDER — LEVALBUTEROL HCL 0.63 MG/3ML IN NEBU
0.6300 mg | INHALATION_SOLUTION | Freq: Three times a day (TID) | RESPIRATORY_TRACT | Status: DC | PRN
  Filled 2012-06-05: qty 3

## 2012-06-05 MED ORDER — PREDNISONE 10 MG PO TABS: 10.0000 mg | ORAL_TABLET | Freq: Every day | ORAL | Status: DC

## 2012-06-05 MED ORDER — MAGNESIUM HYDROXIDE 400 MG/5ML PO SUSP: 30.0000 mL | Freq: Every day | ORAL | Status: DC | PRN

## 2012-06-05 MED ORDER — LEVALBUTEROL HCL 0.63 MG/3ML IN NEBU
0.6300 mg | INHALATION_SOLUTION | Freq: Four times a day (QID) | RESPIRATORY_TRACT | Status: DC | PRN
  Filled 2012-06-05: qty 3

## 2012-06-05 MED ORDER — LEVOFLOXACIN 500 MG PO TABS
500.0000 mg | ORAL_TABLET | Freq: Every day | ORAL | Status: DC
  Administered 2012-06-05: 500 mg via ORAL
  Filled 2012-06-05 (×2): qty 1

## 2012-06-05 MED ORDER — FLEET ENEMA 7-19 GM/118ML RE ENEM
1.0000 | ENEMA | Freq: Every day | RECTAL | Status: DC | PRN
  Administered 2012-06-05: 1 via RECTAL
  Filled 2012-06-05: qty 1

## 2012-06-05 NOTE — Progress Notes (Signed)
Pt was seen and examined at bedside. Pt is hemodynamically stable and denies shortness of breath at the time of the examination. Agree that Leukocytosis is secondary to steroids. I have spend > 1 hour discussing diagnosis and treatment. Questions were answered. Husband was present in the room at the time of this visit. Agree with change Levaquin to PO.  Debbora Presto [facility], pager #: [phone] Main office number: [phone]

## 2012-06-05 NOTE — Progress Notes (Signed)
Occupational Therapy Treatment Patient Details Name: [name] MRN: [id number] DOB: Dec 23, 1946 Today's Date: 06/05/2012 Time: 4098-1191 OT Time Calculation (min): 36 min  OT Assessment / Plan / Recommendation Comments on Treatment Session      Follow Up Recommendations  No OT follow up    Barriers to Discharge       Equipment Recommendations   (family to get shower seat)    Recommendations for Other Services    Frequency Min 2X/week   Plan      Precautions / Restrictions Precautions Precautions: Fall Restrictions Weight Bearing Restrictions: No   Pertinent Vitals/Pain 97% 1 liter; 88% off 02, 90% 02 replaced and increased to 95% after pursed lip breathing.  Pt will little bit of headache    ADL  Transfers/Ambulation Related to ADLs: supervision reaching to retrieve clothes.  Pt felt lightheaded/dizzy. Supervision for safety when walking.  Discussed strategies of sitting if possible or leaning against wall and calling for hellp ADL Comments: Tested for BPPV, -  Reinforced energy conservation and safety if pt feels lightheaded/dizzy.  Pt verbalizes energy conservation strategies; they plan to borrow seat for shower.  Pt return demonstrates pursed lip breathing.     OT Diagnosis:    OT Problem List:   OT Treatment Interventions:     OT Goals Acute Rehab OT Goals Time For Goal Achievement: 06/18/12 ADL Goals Pt Will Transfer to Toilet: with modified independence;Ambulation;Regular height toilet ADL Goal: Toilet Transfer - Progress: Progressing toward goals Miscellaneous OT Goals Miscellaneous OT Goal #1: Pt will gather clothes at mod I level OT Goal: Miscellaneous Goal #1 - Progress: Progressing toward goals Miscellaneous OT Goal #2: Pt will verbalize 3 energy conservation techniques and demonstrate pursed lip breathing as well as rest breaks during OT session OT Goal: Miscellaneous Goal #2 - Progress: Met  Visit Information  Last OT Received On: 06/05/12 Assistance  Needed: +1    Subjective Data      Prior Functioning       Cognition  Overall Cognitive Status: Appears within functional limits for tasks assessed/performed Behavior During Session: [facility] for tasks performed    Mobility Bed Mobility Supine to Sit: 7: Independent Transfers Sit to Stand: 7: Independent;From bed;From chair/3-in-1   Exercises    Balance    End of Session OT - End of Session Activity Tolerance: Patient limited by fatigue Patient left: in bed;with call bell/phone within reach;with family/visitor present   [name] 06/05/2012, 4:08 PM Marica Otter, OTR/L [phone] 06/05/2012

## 2012-06-05 NOTE — Progress Notes (Signed)
Patient ambulated in halls on RA, c/o feeling somewhat short of breath and legs feeling heavy O2 sat 81-88%. At rest her O2 sat maintained 89-91%, placed her back on 1L [place] and O2 sat 93-94%. Continuing to monitor closely.

## 2012-06-05 NOTE — Progress Notes (Signed)
Patient 99% on 3L [place], O2 decreased to 1L [place] O2 sat 96%. Will continue to monitor closely.

## 2012-06-05 NOTE — Progress Notes (Signed)
Subjective: Awake alert oriented. NAD No events during night.   Objective: Vital signs Filed Vitals:   06/04/12 2024 06/04/12 2122 06/05/12 0507 06/05/12 0844  BP:  98/58 112/63   Pulse:  74 68   Temp:  97.9 F (36.6 C) 97.5 F (36.4 C)   TempSrc:  Oral Oral   Resp:  16 14   Height:      Weight:      SpO2: 93% 95% 99% 95%   Weight change:  Last BM Date: 06/23/12  Intake/Output from previous day: 06/11 0701 - 06/12 0700 In: 1983 [P.O.:610; I.V.:1173; IV Piggyback:200] Out: 1050 [Urine:1050] Total I/O In: -  Out: 350 [Urine:350]   Physical Exam: General: Alert, awake, oriented x3, in no acute distress. HEENT: No bruits, no goiter. Heart: Regular rate and rhythm, without murmurs, rubs, gallops. Lungs: Clear to auscultation bilaterally. Abdomen: Soft, nontender, nondistended, positive bowel sounds. Extremities: No clubbing cyanosis or edema with positive pedal pulses. Neuro: Grossly intact, nonfocal.    Lab Results: Basic Metabolic Panel:  Basename 06/05/12 0458 06/04/12 0440 06/03/12 1345  NA 141 139 --  K 4.0 3.7 --  CL 105 98 --  CO2 30 30 --  GLUCOSE 163* 176* --  BUN 18 21 --  CREATININE 0.75 0.79 --  CALCIUM 8.8 9.2 --  MG -- -- 1.9  PHOS -- -- --   Liver Function Tests:  Basename 06/04/12 0440  AST 16  ALT 10  ALKPHOS 100  BILITOT 0.3  PROT 7.1  ALBUMIN 3.0*   No results found for this basename: LIPASE:2,AMYLASE:2 in the last 72 hours No results found for this basename: AMMONIA:2 in the last 72 hours CBC:  Basename 06/05/12 0458 06/04/12 0440  WBC 20.8* 9.3  NEUTROABS -- --  HGB 12.0 12.2  HCT 36.6 37.2  MCV 92.2 91.6  PLT 322 274   Cardiac Enzymes: No results found for this basename: CKTOTAL:3,CKMB:3,CKMBINDEX:3,TROPONINI:3 in the last 72 hours BNP:  Basename 06/03/12 1305  PROBNP 502.6*   D-Dimer: No results found for this basename: DDIMER:2 in the last 72 hours CBG: No results found for this basename: GLUCAP:6 in the last  72 hours Hemoglobin A1C: No results found for this basename: HGBA1C in the last 72 hours Fasting Lipid Panel: No results found for this basename: CHOL,HDL,LDLCALC,TRIG,CHOLHDL,LDLDIRECT in the last 72 hours Thyroid Function Tests: No results found for this basename: TSH,T4TOTAL,FREET4,T3FREE,THYROIDAB in the last 72 hours Anemia Panel: No results found for this basename: VITAMINB12,FOLATE,FERRITIN,TIBC,IRON,RETICCTPCT in the last 72 hours Coagulation: No results found for this basename: LABPROT:2,INR:2 in the last 72 hours Urine Drug Screen: Drugs of Abuse  No results found for this basename: labopia, cocainscrnur, labbenz, amphetmu, thcu, labbarb    Alcohol Level: No results found for this basename: ETH:2 in the last 72 hours Urinalysis: No results found for this basename: COLORURINE:2,APPERANCEUR:2,LABSPEC:2,PHURINE:2,GLUCOSEU:2,HGBUR:2,BILIRUBINUR:2,KETONESUR:2,PROTEINUR:2,UROBILINOGEN:2,NITRITE:2,LEUKOCYTESUR:2 in the last 72 hours Misc. Labs:  No results found for this or any previous visit (from the past 240 hour(s)).  Studies/Results: Dg Chest 2 View  06/03/2012  *RADIOLOGY REPORT*  Clinical Data: Shortness of breath, cough and weakness.  CHEST - 2 VIEW  Comparison: 05/31/2012.  Findings: Trachea is midline.  Heart size normal.  Biapical pleural parenchymal scarring.  Lungs are hyperinflated.  No pleural fluid.  IMPRESSION: COPD without acute finding.  Original Report Authenticated By: Reyes Ivan, M.D.    Medications: Scheduled Meds:   . aspirin  81 mg Oral Daily  . cholecalciferol  1,000 Units Oral Daily  . docusate  sodium  100 mg Oral BID  . DULoxetine  60 mg Oral BID  . enoxaparin  40 mg Subcutaneous Q24H  . estrogens (conjugated)  0.9 mg Oral Daily  . feeding supplement  237 mL Oral QPC supper  . fluconazole  100 mg Oral Daily  . fluticasone  1 spray Each Nare Daily  . gabapentin  100 mg Oral Daily  . gabapentin  200 mg Oral QHS  . guaiFENesin  600 mg Oral  BID  . imipramine  25 mg Oral QHS  . ipratropium  0.5 mg Nebulization Q6H  . levalbuterol  0.63 mg Nebulization Q6H  . levofloxacin (LEVAQUIN) IV  500 mg Intravenous Q24H  . loratadine  10 mg Oral Daily  . methylPREDNISolone (SOLU-MEDROL) injection  40 mg Intravenous Q6H  . multivitamin with minerals  1 tablet Oral Daily  . pantoprazole  40 mg Oral Daily  . polyethylene glycol  17 g Oral BID  . rosuvastatin  10 mg Oral q1800  . sodium chloride  3 mL Intravenous Q12H  . traZODone  100 mg Oral QHS  . DISCONTD: amLODipine  5 mg Oral Daily  . DISCONTD: atenolol  50 mg Oral BID  . DISCONTD: furosemide  20 mg Oral Daily  . DISCONTD: sodium chloride  3 mL Intravenous Q12H   Continuous Infusions:   . sodium chloride 75 mL/hr at 06/05/12 0232   PRN Meds:.acetaminophen, acetaminophen, bisacodyl, levalbuterol, nitroGLYCERIN, ondansetron (ZOFRAN) IV, ondansetron, traMADol, DISCONTD: sodium chloride, DISCONTD: levalbuterol, DISCONTD: sodium chloride  Assessment/Plan:  Active Problems:  COPD exacerbation  Acute bronchitis  Hypothyroidism  GERD (gastroesophageal reflux disease)  CAD (coronary artery disease)  Respiratory failure with hypoxia  Tobacco abuse 1. COPD exacerbation likely related to  infective process. Much improved. Continue  Bronchodilators prn, pt not wheezing and she believes contribute to headache. Will change steroids to po and mucolytics. Oxygen weaned to 1L [place] and sats > 90%. Will check with ambulation.   2. Acute bronchitis: This is the likely culprit for patient's acute worsening, although inhaled irritants may have initiated her exacerbation. Afebrile, non toxic appearing.  Has elevated WC but believe related to steroids. Will change antibiotic to po.  Now on iv Levaquin day#3.   3. Acute hypoxic respiratory failure: Patient;s Oxygen saturation was reportedly 83% in Dr Ernst Spell office. This is due to a combinmnation of acute bronchitis/COPD exacerbation. Patient has  improved somewhat however, and is currently saturating at 93%-95% on 1L .  4. GERD (gastroesophageal reflux disease): Not problematic at this time. Managing with PPI.   5. CAD (coronary artery disease): Known history of non-obstructive CAD, per cath of 07/2010. Stable.   6. Tobacco abuse: unfortunately, patient continues to smoke, although she claims not to have had any cigarettes, for about a week. She has been counseled, and states "I am done with it". She declined Nicoderm CQ.   7. History of Tachyarrhythmia. Patient has known history of paroxysmal tachyarrhythmia, for which she is on Atenolol, under care of Dr Melene Muller, cardiologist. She has remained rate-controlled so far. 12-Lead EKG shows SR, but PR interval appears short.   8. HTN: BP remains borderline low today, with SBP of 98-112 mmhg. Continue to hold Amlodipine, Atenolol and Lasix. Will continue IV fluids for 6 more hours then saline lock.    9. Oral thrush: This is an incidental finding on physical examination at the time of presentation. Patient is now on day#2/10 Diflucan.   10. Dispo: home with husband hopefully tomorrow.  LOS: 2 days   [facility] M 06/05/2012, 10:03 AM

## 2012-06-06 DIAGNOSIS — J441 Chronic obstructive pulmonary disease with (acute) exacerbation: Secondary | ICD-10-CM

## 2012-06-06 DIAGNOSIS — J209 Acute bronchitis, unspecified: Secondary | ICD-10-CM

## 2012-06-06 DIAGNOSIS — I2510 Atherosclerotic heart disease of native coronary artery without angina pectoris: Secondary | ICD-10-CM

## 2012-06-06 DIAGNOSIS — B370 Candidal stomatitis: Secondary | ICD-10-CM

## 2012-06-06 LAB — CBC
Hemoglobin: 11.1 g/dL — ABNORMAL LOW (ref 12.0–15.0)
MCH: 30.1 pg (ref 26.0–34.0)
MCV: 92.7 fL (ref 78.0–100.0)
RBC: 3.69 MIL/uL — ABNORMAL LOW (ref 3.87–5.11)

## 2012-06-06 LAB — BASIC METABOLIC PANEL
BUN: 19 mg/dL (ref 6–23)
CO2: 29 mEq/L (ref 19–32)
Calcium: 8.4 mg/dL (ref 8.4–10.5)
GFR calc non Af Amer: 73 mL/min — ABNORMAL LOW (ref >90)
Glucose, Bld: 100 mg/dL — ABNORMAL HIGH (ref 70–99)
Sodium: 143 mEq/L (ref 135–145)

## 2012-06-06 MED ORDER — LEVOFLOXACIN 500 MG PO TABS: 500.0000 mg | ORAL_TABLET | Freq: Every day | ORAL | Status: AC

## 2012-06-06 MED ORDER — FLUCONAZOLE 100 MG PO TABS: 100.0000 mg | ORAL_TABLET | Freq: Every day | ORAL | Status: AC

## 2012-06-06 MED ORDER — POTASSIUM CHLORIDE CRYS ER 20 MEQ PO TBCR
40.0000 meq | EXTENDED_RELEASE_TABLET | ORAL | Status: AC
  Administered 2012-06-06 (×2): 40 meq via ORAL
  Filled 2012-06-06 (×2): qty 2

## 2012-06-06 MED ORDER — FUROSEMIDE 40 MG PO TABS
40.0000 mg | ORAL_TABLET | Freq: Once | ORAL | Status: AC
  Administered 2012-06-06: 40 mg via ORAL
  Filled 2012-06-06: qty 1

## 2012-06-06 MED ORDER — GUAIFENESIN ER 600 MG PO TB12: 600.0000 mg | ORAL_TABLET | Freq: Two times a day (BID) | ORAL | Status: DC

## 2012-06-06 NOTE — Discharge Instructions (Addendum)
Chronic Obstructive Pulmonary Disease  Chronic obstructive pulmonary disease (COPD) is a lung disease. The lungs become damaged, making it hard to get air in and out of your lungs. The damage to your lungs cannot be changed.   HOME CARE   Stop smoking if you smoke. Avoid secondhand smoke.   Only take medicine as told by your doctor.   Talk to your doctor about using cough syrup or over-the-counter medicines.   Drink enough fluids to keep your pee (urine) clear or pale yellow.   Use a humidifier or vaporizer. This may help loosen the thick spit (mucus).   Talk to your doctor about vaccines that help prevent other lung problems (pneumonia and flu vaccines).   Use home oxygen as told by your doctor.   Stay active and exercise.   Eat healthy foods.  GET HELP RIGHT AWAY IF:    Your heart is beating fast.   You become disturbed, confused, shake, or are dazed.   You have trouble breathing.   You have chest pain.   You have a fever.   You cough up thick spit that is yellowish-white or green.   Your breathing becomes worse when you exercise.   You are running out of the medicine you take for your breathing.  MAKE SURE YOU:    Understand these instructions.   Will watch your condition.   Will get help right away if you are not doing well or get worse.  Document Released: 05/29/2008 Document Revised: 11/30/2011 Document Reviewed: 02/10/2011  [employer] Patient Information 2012 [employer].

## 2012-06-06 NOTE — Progress Notes (Signed)
Physical Therapy Treatment Patient Details Name: [name] MRN: [id number] DOB: Oct 16, 1946 Today's Date: 06/06/2012 Time: 4098-1191 PT Time Calculation (min): 11 min  PT Assessment / Plan / Recommendation Comments on Treatment Session  Pt ambulated in hallway on room air with SaO2 dropping to 86%.  Pt SaO2 upon return to room 75% however checked against another pulse ox with 90% reading.  Pt also able to cough up sputum today.    Follow Up Recommendations  No PT follow up    Barriers to Discharge        Equipment Recommendations  None recommended by PT    Recommendations for Other Services    Frequency     Plan Discharge plan remains appropriate;Frequency remains appropriate    Precautions / Restrictions Restrictions Weight Bearing Restrictions: No   Pertinent Vitals/Pain SaO2 97% room air pregait See ambulation section    Mobility  Bed Mobility Bed Mobility: Supine to Sit Supine to Sit: 7: Independent Transfers Transfers: Sit to Stand;Stand to Sit Sit to Stand: 7: Independent;From bed Stand to Sit: 7: Independent;To chair/3-in-1 Ambulation/Gait Ambulation/Gait Assistance: 5: Supervision Ambulation Distance (Feet): 100 Feet (x2) Assistive device: None Ambulation/Gait Assistance Details: required seated rest break, SaO2 dropped to 86% on room air during ambulation, pt reported she wished to ambulate back to room without O2 applied Gait Pattern: Step-through pattern Gait velocity: decreased    Exercises     PT Diagnosis:    PT Problem List:   PT Treatment Interventions:     PT Goals Acute Rehab PT Goals PT Goal: Ambulate - Progress: Progressing toward goal  Visit Information  Last PT Received On: 06/06/12 Assistance Needed: +1    Subjective Data  Subjective: "yay, finally."  (coughing up sputum)   Cognition  Overall Cognitive Status: Appears within functional limits for tasks assessed/performed    Balance     End of Session PT - End of  Session Activity Tolerance: Patient limited by fatigue Patient left: in chair;with call bell/phone within reach    [facility] E 06/06/2012, 10:12 AM Pager: [phone]

## 2012-06-06 NOTE — Discharge Summary (Signed)
Physician Discharge Summary  Patient ID: [name] MRN: [id number] DOB/AGE: December 02, 1946 66 y.o.  Admit date: 06/03/2012 Discharge date: 06/06/2012  Primary Care Physician:  Loreen Freud, DO   Discharge Diagnoses:  Acute respiratory failure secondary to COPD flare  Active Problems:  COPD exacerbation  Acute bronchitis  Hypothyroidism  GERD (gastroesophageal reflux disease)  CAD (coronary artery disease)  Respiratory failure with hypoxia  Tobacco abuse   Medication List  As of 06/06/2012 11:11 AM   STOP taking these medications         atenolol 50 MG tablet      moxifloxacin 400 MG tablet         TAKE these medications         albuterol 108 (90 BASE) MCG/ACT inhaler   Commonly known as: PROVENTIL HFA;VENTOLIN HFA   Inhale 2 puffs into the lungs every 4 (four) hours as needed.      amLODipine 5 MG tablet   Commonly known as: NORVASC   Take 5 mg by mouth daily.      aspirin 81 MG tablet   Take 81 mg by mouth daily.      B-COMPLEX/B-12 PO   Take 1 tablet by mouth daily.      beclomethasone 40 MCG/ACT inhaler   Commonly known as: QVAR   Inhale 2 puffs into the lungs 2 (two) times daily.      CALCIUM 1000 + D PO   Take 2 tablets by mouth daily.      cyclobenzaprine 10 MG tablet   Commonly known as: FLEXERIL   Take 1 tablet (10 mg total) by mouth every 8 (eight) hours as needed for muscle spasms.      desloratadine 5 MG tablet   Commonly known as: CLARINEX   Take 5 mg by mouth daily.      DULoxetine 60 MG capsule   Commonly known as: CYMBALTA   Take 1 capsule (60 mg total) by mouth 2 (two) times daily.      esomeprazole 40 MG capsule   Commonly known as: NEXIUM   Take 1 capsule 30 min before breakfast.  Lot# [id number]  Exp date: 03/2014      estrogens (conjugated) 0.9 MG tablet   Commonly known as: PREMARIN   1 po qd      fluconazole 100 MG tablet   Commonly known as: DIFLUCAN   Take 1 tablet (100 mg total) by mouth daily.      furosemide 20 MG  tablet   Commonly known as: LASIX   Take 1 tablet (20 mg total) by mouth daily.      gabapentin 100 MG capsule   Commonly known as: NEURONTIN   Take 100-200 mg by mouth See admin instructions. Take one tab in am and 2 tabs in pm      guaiFENesin 600 MG 12 hr tablet   Commonly known as: MUCINEX   Take 1 tablet (600 mg total) by mouth 2 (two) times daily.      ibuprofen 200 MG tablet   Commonly known as: ADVIL,MOTRIN   Take 800 mg by mouth every 6 (six) hours as needed. For pain      imipramine 25 MG tablet   Commonly known as: TOFRANIL   Take 1 tablet (25 mg total) by mouth at bedtime.      levofloxacin 500 MG tablet   Commonly known as: LEVAQUIN   Take 1 tablet (500 mg total) by mouth daily at 6 PM.  mometasone 50 MCG/ACT nasal spray   Commonly known as: NASONEX   2 sprays by Nasal route daily.      multivitamin tablet   Take 1 tablet by mouth daily.      nitroGLYCERIN 0.4 MG SL tablet   Commonly known as: NITROSTAT   Place 0.4 mg under the tongue every 5 (five) minutes as needed.      PA PROBIOTIC COMPLEX Tabs   Take 1 tablet by mouth daily.      polyethylene glycol packet   Commonly known as: MIRALAX / GLYCOLAX   Take 17 g by mouth daily.      rosuvastatin 10 MG tablet   Commonly known as: CRESTOR   Take 1 tablet (10 mg total) by mouth daily.      STOOL SOFTENER PO   Take by mouth. As needed      traMADol 50 MG tablet   Commonly known as: ULTRAM   1-2 po every 6 hours prn      traZODone 100 MG tablet   Commonly known as: DESYREL   Take 1 tablet (100 mg total) by mouth at bedtime.      Vitamin D 1000 UNITS capsule   Take 1,000 Units by mouth daily.             Disposition and Follow-up:   Please note that pt was told to hold off on taking Atenolol as she has had bradycardia and low BP.   I have advised pt to check BP at home and to call me with the readings so that we can readjust the medication regimen as indicated. In addition, pt was given  total of 80 meq of K-dur before the discharge and will need to have the blood work done in an outpt setting to ensure that electrolytes are stable.  I have scheduled the appointment for pt with her primary cardiologist next week.   Please check her BMP to ensure that potassium and creatinine are stable and within normal limits.   Please also note that pt was told to increase the dose of lasix temporarily and to call me back in Am with the weight and BP and I will readjust her dose accordingly.   Her dose of lasix at home was 20 mg QD and I told her to take 2 tablets daily until we get her to dry weight.   This will also have to be checked in an outpatient setting. I call PCCM with [facility] to have appointment scheduled for pt as she has COPD and was sent home on oxygen.   Pt will need PFT in an outpatient setting and she will be called for the exact appointment time and date,  Follow-up Information    Follow up with Loreen Freud, DO. Schedule an appointment as soon as possible for a visit in 1 week. (Will need BMET to check electrolytes)    Contact information:   [address] [place] [address] [phone]          Consults:  None   Physical Exam:  General: Alert, awake, oriented x3, in no acute distress. Up in chair.  HEENT: No bruits, no goiter. Mucus membranes mouth slightly dry but pink.  Heart: Regular rate and rhythm, without murmurs, rubs, gallops. No LEE  Lungs: Normal effort at rest. Frequent productive cough. Breath sounds with improved air movement. Fine crackles bilateral bases. No wheeze.   Abdomen: Soft, nontender, nondistended, positive bowel sounds.  Extremities: No clubbing cyanosis or edema with positive pedal pulses.  Neuro: Grossly intact, nonfocal. Speech clear. Facial symmetry.   Significant Diagnostic Studies:   CBC    Component Value Date/Time   WBC 19.9* 06/06/2012 0510   RBC 3.69* 06/06/2012 0510    HGB 11.1* 06/06/2012 0510   HCT 34.2* 06/06/2012 0510   PLT 334 06/06/2012 0510   MCV 92.7 06/06/2012 0510   MCH 30.1 06/06/2012 0510   MCHC 32.5 06/06/2012 0510   RDW 14.2 06/06/2012 0510   LYMPHSABS 2.0 01/27/2011 1116   MONOABS 0.4 01/27/2011 1116   EOSABS 0.2 01/27/2011 1116   BASOSABS 0.0 01/27/2011 1116    BMET    Component Value Date/Time   NA 143 06/06/2012 0510   K 3.3* 06/06/2012 0510   CL 106 06/06/2012 0510   CO2 29 06/06/2012 0510   GLUCOSE 100* 06/06/2012 0510   BUN 19 06/06/2012 0510   CREATININE 0.82 06/06/2012 0510   CALCIUM 8.4 06/06/2012 0510   GFRNONAA 73* 06/06/2012 0510   GFRAA 85* 06/06/2012 0510     Dg Chest 2 View 06/03/2012   IMPRESSION: COPD without acute finding.  Original Report Authenticated By: Reyes Ivan, M.D.   Dg Chest 2 View 05/31/2012    IMPRESSION: No evidence of acute cardiopulmonary disease.  Chronic interstitial markings/emphysematous changes.     Brief H and P: For complete details please refer to admission H and P, but in brief  This is a 66 year old female, with known history of COPD, smoker, HTN, GERD, depression, tachyarrhythmia, non-obstructive CAD, per cath 07/2010, s/p Hysterectomy, s/p Throat cyst removal, s/p skin cancer removal in past, presenting to [facility] ED on 06/03/12 with cc of persistent cough, worsening SOB and wheeze. Patient had been having trouble with shortness of breath and congestion since 05/24/12, which she associates with inadvertently inhaling some chemicals, while having her hair permed. From that time on  she had not felt quite right, with sinus/nasal congestion, a non-productive cough and wheeze. She saw her primary care doctor on 05/28/12 and was prescribed inhalers and Azithromycin, which she utilized faithfully, without improvement. Patient called the PMD's office again on 05/31/12, a CXR was arranged, which showed emphysematous changes, but no acute disease. From 06/01/12-06/02/12, she  got progressively worse, with exertional dyspnea and  persistent cough, subjective fever, and has had chills. She had trouble even just speaking. She called her PCP the day of admission and was found to have low Oxygen saturation of 83%, treated with Albuterol nebulizers x 2, then sent to ED via EMS. In the ED, she received 2 more nebulizer treatments, following which she coughed up greenish/brownish phlegm. She was admitted to tele service for further management.   Hospital Course:   Active Problems:  COPD exacerbation  Acute bronchitis  Hypothyroidism  GERD (gastroesophageal reflux disease)  CAD (coronary artery disease)  Respiratory failure with hypoxia  Tobacco abuse  1. COPD exacerbation likely related to infective process. Pt admitted to medicine service. Chest xray confirmed emphysematous changes.  Was provided 02 support, solumedrol and nebs and mucolytics. Pt quickly responded with wheezing resolved and less labored breathing. Pt refused further neb treatments as she experienced hot flash and nausea with them. Pt continued to improve and on day of discharge is able to maintain sats with O2 support at rest and with exertions. Her solumedrol was discontinued after 2 days as pt improved. At discharge pt will have home 02 set at 1L at rest and 2L with  exertion. She will follow up with PCP in one week for evaluation.    2. Acute bronchitis: This is the likely culprit for patient's acute worsening, although inhaled irritants may have initiated her exacerbation. Pt remained afebrile and non-toxic. She had elevated WC but believe related to steroids. At discharge WC trending downward.  She was started on IV levaquin that was changed to PO on day 2. Will complete 7 day course at home.    3. Acute hypoxic respiratory failure: Patient;s Oxygen saturation was reportedly 83% in Dr Ernst Spell office. This is due to a combinmnation of acute bronchitis/COPD exacerbation. Patient has improved somewhat however, and is currently saturating at 93%-95% on 1L at rest.  Her room air sats at rest and with exertion are 87% and 82% respectively. At discharge she did have some fine crackles in bases and her weight was 52.7kg from 50.9kg on admission. Her home lasix held initially due to soft BP. Will give lasix 40mg  po at discharge and she will resume her home dose of 20mg  daily 06/07/12.  She will be discharged to home with oxygen. Will follow up with PCP in 1 week for evaluation of long term oxygen needs.    4. GERD (gastroesophageal reflux disease): Not problematic during this hospitalization  at this time. Managing with PPI.  5. CAD (coronary artery disease): Known history of non-obstructive CAD, per cath of 07/2010. Remained stable.  6. Tobacco abuse: unfortunately, patient continues to smoke, although she claims not to have had any cigarettes, for about a week. She has been counseled, and states "I am done with it". She declined Nicoderm CQ.   7. History of Tachyarrhythmia. Patient has known history of paroxysmal tachyarrhythmia, for which she is on Atenolol, under care of Dr Melene Muller, cardiologist. She remained rate-controlled. 12-Lead EKG shows SR, but PR interval appears short.   8. HTN: BP remained borderline days 1 and 2 so lasix , BB and norvasc held. At discharge SBP range 129-118 but HR 58-60. Will continue to hold BB at discharge and resume lasix and norvasc. Will need to follow up with PCP 1 week for evaluation of BP control and need to resume BB.    9. Oral thrush: This is an incidental finding on physical examination at the time of presentation. At discharge pt day #3/10 Diflucan. Improving.     Time spent on Discharge: 40 minutes  Signed: Gwenyth Bender 06/06/2012, 11:11 AM  I have seen and examined the pt at bedside and have discussed the medical condition and plan of treatment > 30 minutes. I agree with the above assessment and plan.  Debbora Presto [facility] [phone]

## 2012-06-11 ENCOUNTER — Encounter: Payer: Self-pay | Admitting: Physician Assistant

## 2012-06-11 ENCOUNTER — Ambulatory Visit (INDEPENDENT_AMBULATORY_CARE_PROVIDER_SITE_OTHER): Payer: Medicare Other | Admitting: Physician Assistant

## 2012-06-11 VITALS — BP 94/64 | HR 89 | Ht 64.55 in | Wt 113.0 lb

## 2012-06-11 DIAGNOSIS — I2510 Atherosclerotic heart disease of native coronary artery without angina pectoris: Secondary | ICD-10-CM

## 2012-06-11 DIAGNOSIS — R0989 Other specified symptoms and signs involving the circulatory and respiratory systems: Secondary | ICD-10-CM

## 2012-06-11 DIAGNOSIS — I951 Orthostatic hypotension: Secondary | ICD-10-CM

## 2012-06-11 DIAGNOSIS — I10 Essential (primary) hypertension: Secondary | ICD-10-CM

## 2012-06-11 DIAGNOSIS — R079 Chest pain, unspecified: Secondary | ICD-10-CM

## 2012-06-11 DIAGNOSIS — R0600 Dyspnea, unspecified: Secondary | ICD-10-CM

## 2012-06-11 DIAGNOSIS — R0609 Other forms of dyspnea: Secondary | ICD-10-CM

## 2012-06-11 LAB — BASIC METABOLIC PANEL
CO2: 31 mEq/L (ref 19–32)
Calcium: 8.6 mg/dL (ref 8.4–10.5)
Creatinine, Ser: 1.1 mg/dL (ref 0.4–1.2)
GFR: 54.37 mL/min — ABNORMAL LOW (ref >60.00)
Glucose, Bld: 81 mg/dL (ref 70–99)

## 2012-06-11 MED ORDER — AMLODIPINE BESYLATE 5 MG PO TABS: ORAL_TABLET | ORAL | Status: DC

## 2012-06-11 NOTE — Progress Notes (Signed)
[address] [place], [place]  [address] Phone: [phone] Fax:  [phone]  Date:  06/11/2012   Name:  [name]   DOB:  [birth year]   MRN:  [id number]  PCP:  Loreen Freud, DO  Primary Cardiologist:  Dr. Verne Carrow  Primary Electrophysiologist:  None    History of Present Illness: [name] is a 66 y.o. female who returns for post hospital follow up.  She has a history of non-struts of CAD, COPD, hypothyroidism, GERD and hypertension.  Left heart catheterization 07/2010: pLAD 40%, oLAD 30%, mLAD 30%, pRCA 30%, EF 60%.  Last echo 10/2010: EF 55-60%, mild MR.  Last seen by Dr. Clifton James 03/2012.  At that point, she was stable.  She was given some Lasix to take for dependent edema and asked to follow up in one year.  She was admitted 6/10-6/13 secondary to acute respiratory failure in the setting of COPD exacerbation.  The patient had hypotension and bradycardia and her atenolol was held.  She was asked to follow up here today.  The patient indicates that there was a question of volume overload.  Her chest x-ray in the hospital was clear of edema.  Her BNP was mildly elevated.  She does tell me that she had increased lower extremity edema.  She was apparently given extra Lasix for this.  She has a history of dependent edema.  She does not have a history of diastolic or systolic heart failure.  Her breathing is improving since discharge from the hospital.  She does note lightheadedness especially with standing.  She denies syncope.  She has chest discomfort.  This has been fairly chronic over the last several months.  She notes it with exertion.  It is described as tight. She feels it between her shoulder blades as well as her right neck.  She denies nausea but does note diaphoresis.  She denies orthopnea or PND.  She is using O2 as needed.  Wt Readings from Last 3 Encounters:  06/11/12 113 lb (51.256 kg)  06/06/12 116 lb 2.9 oz (52.7 kg)  06/03/12 113  lb (51.256 kg)     Potassium  Date/Time Value Range Status  06/06/2012  5:10 AM 3.3* 3.5 - 5.1 mEq/L Final     DELTA CHECK NOTED     Creatinine, Ser  Date/Time Value Range Status  06/06/2012  5:10 AM 0.82  0.50 - 1.10 mg/dL Final     ALT  Date/Time Value Range Status  06/04/2012  4:40 AM 10  0 - 35 U/L Final     TSH  Date/Time Value Range Status  01/27/2011 11:16 AM 1.22  0.35 - 5.50 uIU/mL Final     Hemoglobin  Date/Time Value Range Status  06/06/2012  5:10 AM 11.1* 12.0 - 15.0 g/dL Final    Past Medical History  Diagnosis Date  . GERD (gastroesophageal reflux disease)   . Arthritis   . Asthma   . Skin cancer   . Hypertension   . Migraine   . History of colonic polyps   . Hypothyroidism   . Bronchitis     chronic  . Irregular heartbeat   . CAD (coronary artery disease)     mild non-obstructive cad    Current Outpatient Prescriptions  Medication Sig Dispense Refill  . amLODipine (NORVASC) 5 MG tablet Take 5 mg by mouth daily.      Marland Kitchen aspirin 81 MG tablet Take 81 mg by mouth daily.        Marland Kitchen  B Complex Vitamins (B-COMPLEX/B-12 PO) Take 1 tablet by mouth daily.        . beclomethasone (QVAR) 40 MCG/ACT inhaler Inhale 2 puffs into the lungs 2 (two) times daily as needed.      . Calcium Carb-Cholecalciferol (CALCIUM 1000 + D PO) Take 2 tablets by mouth daily.        . Cholecalciferol (VITAMIN D) 1000 UNITS capsule Take 1,000 Units by mouth daily.        Marland Kitchen desloratadine (CLARINEX) 5 MG tablet Take 5 mg by mouth daily as needed.       Tery Sanfilippo Calcium (STOOL SOFTENER PO) Take by mouth. As needed       . DULoxetine (CYMBALTA) 60 MG capsule Take 1 capsule (60 mg total) by mouth 2 (two) times daily.  90 capsule  3  . esomeprazole (NEXIUM) 40 MG capsule Take 1 capsule 30 min before breakfast. Lot# [id number] Exp date: 03/2014  30 capsule  0  . estrogens, conjugated, (PREMARIN) 0.9 MG tablet 1 po qd  90 tablet  3  . fluconazole (DIFLUCAN) 100 MG tablet Take 1 tablet (100 mg  total) by mouth daily.  7 tablet  0  . furosemide (LASIX) 20 MG tablet Take 1 tablet (20 mg total) by mouth daily.  90 tablet  3  . guaiFENesin (MUCINEX) 600 MG 12 hr tablet Take 1 tablet (600 mg total) by mouth 2 (two) times daily.  30 tablet  0  . ibuprofen (ADVIL,MOTRIN) 200 MG tablet Take 800 mg by mouth every 6 (six) hours as needed. For pain      . imipramine (TOFRANIL) 25 MG tablet Take 1 tablet (25 mg total) by mouth at bedtime.  90 tablet  0  . levofloxacin (LEVAQUIN) 500 MG tablet Take 1 tablet (500 mg total) by mouth daily at 6 PM.  6 tablet  0  . mometasone (NASONEX) 50 MCG/ACT nasal spray 2 sprays by Nasal route daily.        . Multiple Vitamin (MULTIVITAMIN) tablet Take 1 tablet by mouth daily.       . nitroGLYCERIN (NITROSTAT) 0.4 MG SL tablet Place 0.4 mg under the tongue every 5 (five) minutes as needed.        . polyethylene glycol (MIRALAX / GLYCOLAX) packet Take 17 g by mouth daily.       . Probiotic Product (PA PROBIOTIC COMPLEX) TABS Take 1 tablet by mouth daily.        . rosuvastatin (CRESTOR) 10 MG tablet Take 1 tablet (10 mg total) by mouth daily.  90 tablet  3  . traMADol (ULTRAM) 50 MG tablet 1-2 po every 6 hours prn  60 tablet  0  . traZODone (DESYREL) 100 MG tablet Take 1 tablet (100 mg total) by mouth at bedtime.  90 tablet  3  . albuterol (PROVENTIL HFA;VENTOLIN HFA) 108 (90 BASE) MCG/ACT inhaler Inhale 2 puffs into the lungs every 4 (four) hours as needed.      Marland Kitchen DISCONTD: beclomethasone (QVAR) 40 MCG/ACT inhaler Inhale 2 puffs into the lungs 2 (two) times daily.  1 Inhaler  12    Allergies: Allergies  Allergen Reactions  . Clindamycin Hcl Shortness Of Breath and Rash  . Penicillins Anaphylaxis  . Prednisone Shortness Of Breath  . Codeine Hives, Itching and Other (See Comments)    headache  . Pentazocine Lactate   . Pneumococcal Vaccine Polyvalent     REACTION: redness, swelling, and hives at injection site    History  Substance Use Topics  . Smoking  status: Former Smoker -- 0 years    Quit date: 05/27/2012  . Smokeless tobacco: Never Used  . Alcohol Use: Yes     occasional     ROS:  Please see the history of present illness.    All other systems reviewed and negative.   PHYSICAL EXAM: VS:  BP 104/62  Pulse 71  Ht 5' 4.55" (1.64 m)  Wt 113 lb (51.256 kg)  BMI 19.07 kg/m2  Filed Vitals:   06/11/12 1106 06/11/12 1110 06/11/12 1111 06/11/12 1113  BP: 102/71 89/64 88/60  94/64  Pulse: 75 83 85 89  Height:      Weight:      SpO2: 100% 99% 97% 97%     Well nourished, well developed, in no acute distress HEENT: normal Neck: no JVD Cardiac:  normal S1, S2; RRR; no murmur Lungs:  clear to auscultation bilaterally, no wheezing, rhonchi or rales Abd: soft, nontender, no hepatomegaly Ext: no edema Skin: warm and dry Neuro:  CNs 2-12 intact, no focal abnormalities noted  EKG:  Sinus rhythm, heart rate 71, normal axis, nonspecific ST-T wave changes, no change from prior tracing   ASSESSMENT AND PLAN:  1.  Chest Pain She has a h/p non-obs CAD. She has exertional CP. This may be related to COPD. Arrange ETT-myoview.  2.  Orthostatic Hypotension I see no indication for daily Lasix. Stop Lasix. Reduce Amlodipine and then d/c. She knows to use caution from lying to standing.   3.  Edema Most likely dependent.  ? If related to amlodipine as well.  Suspect BNP result in the hospital was spurious. I suspect she is dehydrated from respiratory illness and Lasix. Use Lasix PRN only for now. She will weigh daily.  4.  Non-Obstructive Coronary Artery Disease Continue ASA. Obtain Myoview. Follow up with Dr. Verne Carrow in 3-4 weeks.  5.  Hypertension Adjust meds as noted. If BP increases, consider ACE or ARB.  6.  COPD Management per PCP   Signed, Tereso Newcomer, PA-C  11:05 AM 06/11/2012

## 2012-06-11 NOTE — Patient Instructions (Addendum)
Your physician recommends that you schedule a follow-up appointment in: 3-4 weeks with Dr Clifton James Your physician recommends that you have lab work drawn today [facility]) Your physician has requested that you have en exercise stress myoview. For further information please visit [email or web address] Please follow instruction sheet, as given. Your physician recommends that you keep track of your Blood Pressure if it goes above 140/90 call us and also Weigh daily and call if it goes up 3 lbs in 1 day and if you have increased swelling.

## 2012-06-17 ENCOUNTER — Ambulatory Visit (INDEPENDENT_AMBULATORY_CARE_PROVIDER_SITE_OTHER): Payer: Medicare Other | Admitting: Family Medicine

## 2012-06-17 ENCOUNTER — Encounter: Payer: Self-pay | Admitting: Family Medicine

## 2012-06-17 VITALS — BP 110/82 | HR 86 | Temp 98.3°F | Wt 113.0 lb

## 2012-06-17 DIAGNOSIS — J441 Chronic obstructive pulmonary disease with (acute) exacerbation: Secondary | ICD-10-CM

## 2012-06-17 DIAGNOSIS — I10 Essential (primary) hypertension: Secondary | ICD-10-CM

## 2012-06-17 DIAGNOSIS — R609 Edema, unspecified: Secondary | ICD-10-CM

## 2012-06-17 DIAGNOSIS — J449 Chronic obstructive pulmonary disease, unspecified: Secondary | ICD-10-CM

## 2012-06-17 DIAGNOSIS — I2510 Atherosclerotic heart disease of native coronary artery without angina pectoris: Secondary | ICD-10-CM

## 2012-06-17 DIAGNOSIS — J209 Acute bronchitis, unspecified: Secondary | ICD-10-CM

## 2012-06-17 DIAGNOSIS — R928 Other abnormal and inconclusive findings on diagnostic imaging of breast: Secondary | ICD-10-CM

## 2012-06-17 NOTE — Assessment & Plan Note (Signed)
bp running low Cardio just decreased dose Will d/c if needed

## 2012-06-17 NOTE — Patient Instructions (Signed)
Chronic Obstructive Pulmonary Disease Chronic obstructive pulmonary disease (COPD) is a condition in which airflow from the lungs is restricted. The lungs can never return to normal, but there are measures you can take which will improve them and make you feel better. CAUSES   Smoking.   Exposure to secondhand smoke.   Breathing in irritants (pollution, cigarette smoke, strong smells, aerosol sprays, paint fumes).   History of lung infections.  TREATMENT  Treatment focuses on making you comfortable (supportive care). Your caregiver may prescribe medications (inhaled or pills) to help improve your breathing. HOME CARE INSTRUCTIONS   If you smoke, stop smoking.   Avoid exposure to smoke, chemicals, and fumes that aggravate your breathing.   Take antibiotic medicines as directed by your caregiver.   Avoid medicines that dry up your system and slow down the elimination of secretions (antihistamines and cough syrups). This decreases respiratory capacity and may lead to infections.   Drink enough water and fluids to keep your urine clear or pale yellow. This loosens secretions.   Use humidifiers at home and at your bedside if they do not make breathing difficult.   Receive all protective vaccines your caregiver suggests, especially pneumococcal and influenza.   Use home oxygen as suggested.   Stay active. Exercise and physical activity will help maintain your ability to do things you want to do.   Eat a healthy diet.  SEEK MEDICAL CARE IF:   You develop pus-like mucus (sputum).   Breathing is more labored or exercise becomes difficult to do.   You are running out of the medicine you take for your breathing.  SEEK IMMEDIATE MEDICAL CARE IF:   You have a rapid heart rate.   You have agitation, confusion, tremors, or are in a stupor (family members may need to observe this).   It becomes difficult to breathe.   You develop chest pain.   You have a fever.  MAKE SURE YOU:    Understand these instructions.   Will watch your condition.   Will get help right away if you are not doing well or get worse.  Document Released: 09/20/2005 Document Revised: 11/30/2011 Document Reviewed: 02/10/2011 [employer] Patient Information 2012 [employer]. 

## 2012-06-17 NOTE — Assessment & Plan Note (Signed)
con't inhalers  And keep appoinment with pulm PO 84% with exertions----con't O2

## 2012-06-17 NOTE — Assessment & Plan Note (Signed)
abx and steroids finished

## 2012-06-17 NOTE — Assessment & Plan Note (Signed)
Per cardiology con't meds Stress test pending

## 2012-06-17 NOTE — Assessment & Plan Note (Signed)
None today Lasix prn

## 2012-06-17 NOTE — Progress Notes (Signed)
  Subjective:    Patient ID: [name], female    DOB: 1946/06/11, 66 y.o.   MRN: [id number]  HPI Pt here to f/u from hospital admission for copd, bronchitis and ? chf.  Pt also requesting to come off O2.  She still c/o fatigue and sob with exertion. Pt saw Cardio last week and has ov with pulm next week.    No new complaints.   See mammogram----MRI recommended   Review of Systems    as above Objective:   Physical Exam  Constitutional: She is oriented to person, place, and time. She appears well-developed and well-nourished.  Cardiovascular: Normal rate and regular rhythm.   No murmur heard. Pulmonary/Chest: No respiratory distress. She has no wheezes. She has no rales. She exhibits no tenderness.  Musculoskeletal: She exhibits no edema and no tenderness.  Neurological: She is alert and oriented to person, place, and time.  Psychiatric: She has a normal mood and affect. Her behavior is normal. Judgment and thought content normal.          Assessment & Plan:

## 2012-06-18 ENCOUNTER — Ambulatory Visit (HOSPITAL_COMMUNITY): Payer: Medicare Other | Attending: Physician Assistant | Admitting: Radiology

## 2012-06-18 VITALS — BP 136/95 | Ht 64.5 in | Wt 113.0 lb

## 2012-06-18 DIAGNOSIS — I498 Other specified cardiac arrhythmias: Secondary | ICD-10-CM | POA: Insufficient documentation

## 2012-06-18 DIAGNOSIS — IMO0001 Reserved for inherently not codable concepts without codable children: Secondary | ICD-10-CM | POA: Insufficient documentation

## 2012-06-18 DIAGNOSIS — R0989 Other specified symptoms and signs involving the circulatory and respiratory systems: Secondary | ICD-10-CM | POA: Insufficient documentation

## 2012-06-18 DIAGNOSIS — I10 Essential (primary) hypertension: Secondary | ICD-10-CM

## 2012-06-18 DIAGNOSIS — J449 Chronic obstructive pulmonary disease, unspecified: Secondary | ICD-10-CM | POA: Insufficient documentation

## 2012-06-18 DIAGNOSIS — R0609 Other forms of dyspnea: Secondary | ICD-10-CM | POA: Insufficient documentation

## 2012-06-18 DIAGNOSIS — R079 Chest pain, unspecified: Secondary | ICD-10-CM

## 2012-06-18 DIAGNOSIS — R61 Generalized hyperhidrosis: Secondary | ICD-10-CM | POA: Insufficient documentation

## 2012-06-18 DIAGNOSIS — I2510 Atherosclerotic heart disease of native coronary artery without angina pectoris: Secondary | ICD-10-CM

## 2012-06-18 DIAGNOSIS — R0602 Shortness of breath: Secondary | ICD-10-CM

## 2012-06-18 DIAGNOSIS — R5381 Other malaise: Secondary | ICD-10-CM | POA: Insufficient documentation

## 2012-06-18 DIAGNOSIS — J4489 Other specified chronic obstructive pulmonary disease: Secondary | ICD-10-CM | POA: Insufficient documentation

## 2012-06-18 DIAGNOSIS — Z87891 Personal history of nicotine dependence: Secondary | ICD-10-CM | POA: Insufficient documentation

## 2012-06-18 MED ORDER — TECHNETIUM TC 99M TETROFOSMIN IV KIT
10.0000 | PACK | Freq: Once | INTRAVENOUS | Status: AC | PRN
  Administered 2012-06-18: 10 via INTRAVENOUS

## 2012-06-18 MED ORDER — TECHNETIUM TC 99M TETROFOSMIN IV KIT
30.0000 | PACK | Freq: Once | INTRAVENOUS | Status: AC | PRN
  Administered 2012-06-18: 30 via INTRAVENOUS

## 2012-06-18 NOTE — Progress Notes (Signed)
[facility] SITE 3 NUCLEAR MED [address] [place] [address] [phone]  Cardiology Nuclear Med Study  [name] is a 66 y.o. female     MRN : [id number]     DOB: 1946-12-11  Procedure Date: 06/18/2012  Nuclear Med Background Indication for Stress Test:  Evaluation for Ischemia and Post Hospital 6/10-6/13/13 Acute Respiratory Failure, Bradycardia on O2 prn History:  COPD, 8/11 Heart Cath: EF: 60% N/O CAD, 11/11 ECHO: EF: 55-60%, Asthma, Fibromylagia Cardiac Risk Factors: Carotid Disease, History of Smoking and Hypertension  Symptoms:  Chest Pain, Diaphoresis, DOE and Fatigue   Nuclear Pre-Procedure Caffeine/Decaff Intake:  None NPO After: 7:00pm   Lungs:  clear O2 Sat: 97% on room air. IV 0.9% NS with Angio Cath:  20g  IV Site: R Antecubital  IV Started by:  Stanton Kidney, EMT-P  Chest Size (in):  32 Cup Size: DD  Height: 5' 4.5" (1.638 m)  Weight:  113 lb (51.256 kg)  BMI:  Body mass index is 19.10 kg/(m^2). Tech Comments:  This patient walked on the treadmill and had sob, chest tightness, and neck tightness. She had a (+) EKG, but had good pictures. DOD stand in for Lunch break was P. Nahser was consulted and since the patient was pain free at the time she was free to go. The patient's Dr. Alyson Ingles was at lunch.    Nuclear Med Study 1 or 2 day study: 1 day  Stress Test Type:  Stress  Reading MD: Arvilla Meres, MD  Order Authorizing Provider:  C.McAlhany MD/ S.Weaver PA  Resting Radionuclide: Technetium 21m Tetrofosmin  Resting Radionuclide Dose: 10.5 mCi   Stress Radionuclide:  Technetium 56m Tetrofosmin  Stress Radionuclide Dose: 31.1 mCi           Stress Protocol Rest HR: 96 Stress HR: 148  Rest BP: 136/95 Stress BP: 169/94  Exercise Time (min): 6:00 METS: 7.0   Predicted Max HR: 154 bpm % Max HR: 96.1 bpm Rate Pressure Product: 21308   Dose of Adenosine (mg):  n/a Dose of Lexiscan: n/a mg  Dose of Atropine (mg): n/a Dose of  Dobutamine: n/a mcg/kg/min (at max HR)  Stress Test Technologist: Milana Na, EMT-P  Nuclear Technologist:  Doyne Keel, CNMT     Rest Procedure:  Myocardial perfusion imaging was performed at rest 45 minutes following the intravenous administration of Technetium 66m Tetrofosmin. Rest ECG: NSR - Normal EKG  Stress Procedure:  The patient performed treadmill exercise using a Bruce  Protocol for 6:00 minutes. The patient stopped due to sob,neck tightness, and chest tightness.  There were + significant ST-T wave changes.  Technetium 40m Tetrofosmin was injected at peak exercise and myocardial perfusion imaging was performed after a brief delay. Stress ECG: Significant ST abnormalities consistent with ischemia.  QPS Raw Data Images:  Normal; no motion artifact; normal heart/lung ratio. Stress Images:  There is a small area of moderately decreased uptake in the anteroseptal wall.  Rest Images:  There is a small area of moderately decreased uptake in the anteroseptal wall. Subtraction (SDS):  There is a small fixed area of moderately decreased uptake in the anteroseptal wall. No ischemia. Transient Ischemic Dilatation (Normal <1.22):  1.08 Lung/Heart Ratio (Normal <0.45):  0.23  Quantitative Gated Spect Images QGS EDV:  47 ml QGS ESV:  15 ml  Impression Exercise Capacity:  Fair exercise capacity. BP Response:  Normal blood pressure response. Clinical Symptoms:  There is dyspnea and chest tightness.  ECG  Impression:  Mild non-diagnostic ST scooping with exercise. Comparison with Prior Nuclear Study: No images to compare  Overall Impression:  Low risk stress nuclear study.There is a small fixed area of moderately decreased uptake in the anteroseptal wall which may be artifactual. No ischemia  LV Ejection Fraction: 68%.  LV Wall Motion:  NL LV Function; NL Wall Motion   Brasen Bundren,MD 5:25 PM

## 2012-06-19 ENCOUNTER — Telehealth: Payer: Self-pay | Admitting: *Deleted

## 2012-06-19 DIAGNOSIS — I2510 Atherosclerotic heart disease of native coronary artery without angina pectoris: Secondary | ICD-10-CM

## 2012-06-19 NOTE — Telephone Encounter (Signed)
pt notified of stress test results and will have echo 06/24/12 @ 8:30 to check wall motion

## 2012-06-19 NOTE — Telephone Encounter (Signed)
Message copied by Tarri Fuller on Wed Jun 19, 2012  1:58 PM ------      Message from: [place], [place] T      Created: Wed Jun 19, 2012 12:51 PM       No ischemia.       Probable artifact noted in anterior wall (artifact can be from breast tissue, gut activity)        -  Schedule 2D echo mainly to make sure wall motion in this area is normal.      Keep f/u with Dr. Verne Carrow that is already scheduled.      Tereso Newcomer, PA-C  12:50 PM 06/19/2012

## 2012-06-20 ENCOUNTER — Encounter: Payer: Self-pay | Admitting: Family Medicine

## 2012-06-20 ENCOUNTER — Telehealth: Payer: Self-pay | Admitting: Family Medicine

## 2012-06-20 NOTE — Telephone Encounter (Signed)
Called to give the new, additional information to [employer], ph [phone], was told by nurse reviewer that she can not open a case that has already been denied.  She then transferred me to the Appeal line, I had to leave my name, patient ID, dob, reason for call, and my call back number of [phone].  I am now awaiting a call back for the Appeal department.

## 2012-06-20 NOTE — Telephone Encounter (Signed)
Addition information in relation to patient's breast history & MRI Breast being denied, per my call to patient, she has had 4 to 5 breast biopsies, all came back normal.  In her families history, her Paternal aunt had breast cancer.  Also, patient's mother had a malignant lumpectomy.  Patient & her husband are contacting the insurance company to appeal the MRI denial, and would like to know what Dr. Laury Axon advises at this point.

## 2012-06-20 NOTE — Telephone Encounter (Signed)
Try again with added info

## 2012-06-24 ENCOUNTER — Ambulatory Visit (HOSPITAL_COMMUNITY): Payer: Medicare Other | Attending: Cardiology | Admitting: Radiology

## 2012-06-24 ENCOUNTER — Other Ambulatory Visit: Payer: Self-pay

## 2012-06-24 DIAGNOSIS — R9430 Abnormal result of cardiovascular function study, unspecified: Secondary | ICD-10-CM | POA: Insufficient documentation

## 2012-06-24 DIAGNOSIS — I679 Cerebrovascular disease, unspecified: Secondary | ICD-10-CM | POA: Insufficient documentation

## 2012-06-24 DIAGNOSIS — I10 Essential (primary) hypertension: Secondary | ICD-10-CM | POA: Insufficient documentation

## 2012-06-24 DIAGNOSIS — I059 Rheumatic mitral valve disease, unspecified: Secondary | ICD-10-CM

## 2012-06-24 DIAGNOSIS — I2510 Atherosclerotic heart disease of native coronary artery without angina pectoris: Secondary | ICD-10-CM | POA: Insufficient documentation

## 2012-06-24 DIAGNOSIS — J449 Chronic obstructive pulmonary disease, unspecified: Secondary | ICD-10-CM | POA: Insufficient documentation

## 2012-06-24 DIAGNOSIS — Z87891 Personal history of nicotine dependence: Secondary | ICD-10-CM | POA: Insufficient documentation

## 2012-06-24 DIAGNOSIS — J4489 Other specified chronic obstructive pulmonary disease: Secondary | ICD-10-CM | POA: Insufficient documentation

## 2012-06-24 HISTORY — PX: TRANSTHORACIC ECHOCARDIOGRAM: SHX275

## 2012-06-24 NOTE — Progress Notes (Signed)
Echocardiogram performed.  

## 2012-06-25 ENCOUNTER — Telehealth: Payer: Self-pay

## 2012-06-25 NOTE — Telephone Encounter (Signed)
They should already have it but --ok to fax to them---let pt know

## 2012-06-25 NOTE — Telephone Encounter (Signed)
Msg from [facility] in the appeals department at [facility] and she stated she needed to get records of previous mammogram and ultrasound report to support the MRI. This information can be faxed to the appeals department at [phone]. I do not see any information other than a normal Mammogram, please advise        KP

## 2012-06-26 NOTE — Telephone Encounter (Signed)
Patient called, states the previous abnormal mammograms & ultrasound were done in [place], and were many years ago.  That office is no longer in practice, and patient states there is no way she would be able to get them.  Patient also states that she & her husband have called [employer] 2 different times and are being told that the MRI is approved, but they have not given patient a pre-auth number.  Patient states she & her husband are calling [employer] back to ask for the pre-auth number and will call me back.

## 2012-06-26 NOTE — Telephone Encounter (Signed)
She had last one at [facility]

## 2012-06-26 NOTE — Telephone Encounter (Signed)
Mammogram that was done at [facility] was normal. I have no documentation to send.     KP

## 2012-06-28 ENCOUNTER — Institutional Professional Consult (permissible substitution): Payer: Medicare Other | Admitting: Internal Medicine

## 2012-06-28 NOTE — Telephone Encounter (Signed)
Fyi... Per my last conversation with patient, she states the last 2 times she & her husband called [employer] they are being told mri authorized.  So today I called insurance, spoke with Doree Fudge at 11:50am, and was told this case is currently closed & in the appeal process.

## 2012-06-29 NOTE — Telephone Encounter (Signed)
im really frustrated because i know for a fact i had the results in my hand that recommended an mri---please call [facility] and get another copy faxed---or i will need to talk to them

## 2012-07-01 ENCOUNTER — Encounter: Payer: Self-pay | Admitting: Family Medicine

## 2012-07-01 NOTE — Telephone Encounter (Signed)
I called [facility] and they will send over what they have.     KP

## 2012-07-01 NOTE — Telephone Encounter (Signed)
Faxed to [facility].     KP

## 2012-07-05 ENCOUNTER — Ambulatory Visit (INDEPENDENT_AMBULATORY_CARE_PROVIDER_SITE_OTHER): Payer: Medicare Other | Admitting: Cardiovascular Disease

## 2012-07-05 ENCOUNTER — Encounter: Payer: Self-pay | Admitting: Cardiovascular Disease

## 2012-07-05 VITALS — BP 121/77 | HR 74 | Ht 64.0 in | Wt 113.0 lb

## 2012-07-05 DIAGNOSIS — I2510 Atherosclerotic heart disease of native coronary artery without angina pectoris: Secondary | ICD-10-CM

## 2012-07-05 NOTE — Assessment & Plan Note (Signed)
Stable. She has non-obstructive CAD. NO chest pain. No SOB. Doing well. Continue atenolol. Use Lasix prn.

## 2012-07-05 NOTE — Progress Notes (Signed)
History of Present Illness: [name] is a 66 y.o. female who is here today for cardiac follow up. She has a history of non-obstructive CAD, COPD, hypothyroidism, GERD and hypertension. Left heart catheterization 07/2010: pLAD 40%, oLAD 30%, mLAD 30%, pRCA 30%, EF 60%. Last echo 10/2010: EF 55-60%, mild MR.  She was admitted 6/10-6/13 secondary to acute respiratory failure in the setting of COPD exacerbation. The patient had hypotension and bradycardia and her atenolol was held. She followed up with Tereso Newcomer, PA-C in June 2013. Apparently, there was a question of volume overload during her admission. Her chest x-ray in the hospital was clear of edema. Her BNP was mildly elevated. She c/o chest pain at the f/u visit in June so stress myoview was arranged. There was no ischemia. LVEF was 68%. There was question of anterior wall defect without ischemia. Echo was arranged to assess wall motion and this was normal. No RWMA. LVEF normal.   She is here for f/u. She is feeling well. No chest pain or SOB. She is using Lasix prn and her weight is stable. Her pains are much better.   Primary Care Physician: Loreen Freud  Last Lipid Profile:  Lipid Panel     Component Value Date/Time   CHOL 210* 04/08/2012 1128   TRIG 158.0* 04/08/2012 1128   HDL 68.40 04/08/2012 1128   CHOLHDL 3 04/08/2012 1128   VLDL 31.6 04/08/2012 1128   LDLCALC 64 01/27/2011 1116     Past Medical History  Diagnosis Date  . GERD (gastroesophageal reflux disease)   . Arthritis   . Asthma   . Skin cancer   . Hypertension   . Migraine   . History of colonic polyps   . Hypothyroidism   . Bronchitis     chronic  . Irregular heartbeat   . CAD (coronary artery disease)     mild non-obstructive cad    Past Surgical History  Procedure Date  . Appendectomy   . Tonsillectomy   . Skin cancer removal   . Vocal cord cyst removal   . Nasal sinus surgery   . Right ankle   . Right hand surgery   . Abdominal hysterectomy     . Breast biopsy     multiple --all neg    Current Outpatient Prescriptions  Medication Sig Dispense Refill  . albuterol (PROVENTIL HFA;VENTOLIN HFA) 108 (90 BASE) MCG/ACT inhaler Inhale 2 puffs into the lungs every 4 (four) hours as needed.      Marland Kitchen amLODipine (NORVASC) 5 MG tablet prn      . aspirin 81 MG tablet Take 81 mg by mouth daily.        . B Complex Vitamins (B-COMPLEX/B-12 PO) Take 1 tablet by mouth daily.        . beclomethasone (QVAR) 40 MCG/ACT inhaler Inhale 2 puffs into the lungs 2 (two) times daily as needed.      . Calcium Carb-Cholecalciferol (CALCIUM 1000 + D PO) Take 2 tablets by mouth daily.        . Cholecalciferol (VITAMIN D) 1000 UNITS capsule Take 1,000 Units by mouth daily.        Marland Kitchen desloratadine (CLARINEX) 5 MG tablet Take 5 mg by mouth daily as needed.       Tery Sanfilippo Calcium (STOOL SOFTENER PO) Take by mouth. As needed       . DULoxetine (CYMBALTA) 60 MG capsule Take 1 capsule (60 mg total) by mouth 2 (two) times daily.  90  capsule  3  . esomeprazole (NEXIUM) 40 MG capsule Take 1 capsule 30 min before breakfast. Lot# [id number] Exp date: 03/2014  30 capsule  0  . estrogens, conjugated, (PREMARIN) 0.9 MG tablet 1 po qd  90 tablet  3  . furosemide (LASIX) 20 MG tablet As needed      . guaiFENesin (MUCINEX) 600 MG 12 hr tablet Take 1 tablet (600 mg total) by mouth 2 (two) times daily.  30 tablet  0  . ibuprofen (ADVIL,MOTRIN) 200 MG tablet Take 800 mg by mouth every 6 (six) hours as needed. For pain      . imipramine (TOFRANIL) 25 MG tablet Take 1 tablet (25 mg total) by mouth at bedtime.  90 tablet  0  . mometasone (NASONEX) 50 MCG/ACT nasal spray 2 sprays by Nasal route daily.        . Multiple Vitamin (MULTIVITAMIN) tablet Take 1 tablet by mouth daily.       . nitroGLYCERIN (NITROSTAT) 0.4 MG SL tablet Place 0.4 mg under the tongue every 5 (five) minutes as needed.        . polyethylene glycol (MIRALAX / GLYCOLAX) packet Take 17 g by mouth daily.       .  Probiotic Product (PA PROBIOTIC COMPLEX) TABS Take 1 tablet by mouth daily.        . rosuvastatin (CRESTOR) 10 MG tablet Take 1 tablet (10 mg total) by mouth daily.  90 tablet  3  . traMADol (ULTRAM) 50 MG tablet 1-2 po every 6 hours prn  60 tablet  0  . traZODone (DESYREL) 100 MG tablet Take 1 tablet (100 mg total) by mouth at bedtime.  90 tablet  3    Allergies  Allergen Reactions  . Clindamycin Hcl Shortness Of Breath and Rash  . Penicillins Anaphylaxis  . Prednisone Shortness Of Breath  . Codeine Hives, Itching and Other (See Comments)    headache  . Pentazocine Lactate   . Pneumococcal Vaccine Polyvalent     REACTION: redness, swelling, and hives at injection site    History   Social History  . Marital Status: Married    Spouse Name: N/A    Number of Children: 3  . Years of Education: N/A   Occupational History  . Retired Charity fundraiser    Social History Main Topics  . Smoking status: Former Smoker -- 0 years    Quit date: 05/27/2012  . Smokeless tobacco: Never Used  . Alcohol Use: Yes     occasional  . Drug Use: No  . Sexually Active: Not on file   Other Topics Concern  . Not on file   Social History Narrative  . No narrative on file    Family History  Problem Relation Age of Onset  . Rectal cancer Mother   . Cancer Mother     rectal,  breast  . Irritable bowel syndrome Son   . Irritable bowel syndrome Daughter   . Heart disease Other   . Arthritis Other   . Cancer Other     breast cancer 1st degree relative <50, lung  . Diabetes Other     1st degree relative   . Hyperlipidemia Other   . Hypertension Other   . Osteoporosis Other   . Coronary artery disease Other 65  . Cancer Maternal Aunt     breast    Review of Systems:  As stated in the HPI and otherwise negative.   BP 121/77  Pulse 74  Ht  5\' 4"  (1.626 m)  Wt 113 lb (51.256 kg)  BMI 19.40 kg/m2  Physical Examination: General: Well developed, well nourished, NAD HEENT: OP clear, mucus membranes  moist SKIN: warm, dry. No rashes. Neuro: No focal deficits Musculoskeletal: Muscle strength 5/5 all ext Psychiatric: Mood and affect normal Neck: No JVD, no carotid bruits, no thyromegaly, no lymphadenopathy. Lungs:Clear bilaterally, no wheezes, rhonci, crackles Cardiovascular: Regular rate and rhythm. No murmurs, gallops or rubs. Abdomen:Soft. Bowel sounds present. Non-tender.  Extremities: No lower extremity edema. Pulses are 2 + in the bilateral DP/PT.  Stress myoview:  Stress Procedure: The patient performed treadmill exercise using a Bruce Protocol for 6:00 minutes. The patient stopped due to sob,neck tightness, and chest tightness. There were + significant ST-T wave changes. Technetium 62m Tetrofosmin was injected at peak exercise and myocardial perfusion imaging was performed after a brief delay.  Stress ECG: Significant ST abnormalities consistent with ischemia.  QPS  Raw Data Images: Normal; no motion artifact; normal heart/lung ratio.  Stress Images: There is a small area of moderately decreased uptake in the anteroseptal wall.  Rest Images: There is a small area of moderately decreased uptake in the anteroseptal wall.  Subtraction (SDS): There is a small fixed area of moderately decreased uptake in the anteroseptal wall. No ischemia.  Transient Ischemic Dilatation (Normal <1.22): 1.08  Lung/Heart Ratio (Normal <0.45): 0.23  Quantitative Gated Spect Images  QGS EDV: 47 ml  QGS ESV: 15 ml  Impression  Exercise Capacity: Fair exercise capacity.  BP Response: Normal blood pressure response.  Clinical Symptoms: There is dyspnea and chest tightness.  ECG Impression: Mild non-diagnostic ST scooping with exercise.  Comparison with Prior Nuclear Study: No images to compare  Overall Impression: Low risk stress nuclear study.There is a small fixed area of moderately decreased uptake in the anteroseptal wall which may be artifactual. No ischemia  LV Ejection Fraction: 68%. LV Wall  Motion: NL LV Function; NL Wall Motion    Echo Left ventricle: The cavity size was normal. Wall thickness was normal. Systolic function was normal. The estimated ejection fraction was in the range of 55% to 60%. Wall motion was normal; there were no regional wall motion abnormalities. Doppler parameters are consistent with abnormal left ventricular relaxation (grade 1 diastolic dysfunction). - Aortic valve: Trivial regurgitation. - Mitral valve: Mild regurgitation. - Pulmonary arteries: Systolic pressure was mildly increased. PA peak pressure: 31mm Hg (S).

## 2012-07-05 NOTE — Patient Instructions (Addendum)
Your physician wants you to follow-up in:  6 months. You will receive a reminder letter in the mail two months in advance. If you don't receive a letter, please call our office to schedule the follow-up appointment.   

## 2012-07-08 ENCOUNTER — Other Ambulatory Visit: Payer: Self-pay | Admitting: Family Medicine

## 2012-07-08 ENCOUNTER — Telehealth: Payer: Self-pay | Admitting: *Deleted

## 2012-07-08 NOTE — Telephone Encounter (Signed)
Received incoming call from Ronalee Red with [facility] [phone] noting that pt called [facility] and advised that she was told by PCP to discontinue her oxygen, Theodoro Grist requested an order for the D/C of O2 sent to [phone], please advise

## 2012-07-08 NOTE — Telephone Encounter (Signed)
refill atenolol tab 50mg  take one by mouth twice daily - wants 90 day supply,   Last ov 6.24.13,   NOTE not on current meds listing shows prescribed 6.10.13 by Ordering User: Laveda Norman, MD

## 2012-07-08 NOTE — Telephone Encounter (Signed)
Please advise      KP 

## 2012-07-08 NOTE — Telephone Encounter (Signed)
I did not tell her to stop her O2---- pulse ox was 85% when she was here

## 2012-07-08 NOTE — Telephone Encounter (Signed)
Rx shows to stop taking at discharge please advise     KP

## 2012-07-08 NOTE — Telephone Encounter (Signed)
Atenolol was stopped at d/c Was pt not aware?

## 2012-07-09 ENCOUNTER — Encounter: Payer: Medicare Other | Admitting: Family Medicine

## 2012-07-09 MED ORDER — ATENOLOL 50 MG PO TABS: 50.0000 mg | ORAL_TABLET | Freq: Every day | ORAL | Status: DC

## 2012-07-09 NOTE — Telephone Encounter (Signed)
msg left to call the office     KP 

## 2012-07-09 NOTE — Telephone Encounter (Signed)
Then they need to have him do it

## 2012-07-09 NOTE — Telephone Encounter (Signed)
msg left to call back.   KP 

## 2012-07-09 NOTE — Telephone Encounter (Signed)
Patient said Dr.McAlhany gave her authorization to discontinue Oxygen.    KP

## 2012-07-09 NOTE — Telephone Encounter (Signed)
Pt returned your call.  

## 2012-07-09 NOTE — Telephone Encounter (Signed)
Ok--  #180  3 refills

## 2012-07-09 NOTE — Telephone Encounter (Signed)
Discussed with patient  and she is only taking 1 pill daily--- Rx sent for 90 with 3 refills     KP

## 2012-07-09 NOTE — Telephone Encounter (Signed)
Patient stated she was put back on the atenolol by Dr.McAlhany. Please advise    KP

## 2012-07-09 NOTE — Telephone Encounter (Signed)
Discussed with patient and she voiced understanding, I gave her the fax # to [facility] and she will notify Dr.McAlhany.    KP

## 2012-07-22 ENCOUNTER — Encounter: Payer: Self-pay | Admitting: Family Medicine

## 2012-07-22 ENCOUNTER — Other Ambulatory Visit (HOSPITAL_COMMUNITY)
Admission: RE | Admit: 2012-07-22 | Discharge: 2012-07-22 | Disposition: A | Discharge status: Home / Self Care | Payer: Medicare Other | Source: Ambulatory Visit | Attending: Family Medicine | Admitting: Family Medicine

## 2012-07-22 ENCOUNTER — Ambulatory Visit (INDEPENDENT_AMBULATORY_CARE_PROVIDER_SITE_OTHER): Payer: Medicare Other | Admitting: Family Medicine

## 2012-07-22 ENCOUNTER — Telehealth: Payer: Self-pay | Admitting: Cardiovascular Disease

## 2012-07-22 VITALS — BP 120/72 | HR 74 | Temp 98.4°F | Ht 64.0 in | Wt 116.4 lb

## 2012-07-22 DIAGNOSIS — Z85828 Personal history of other malignant neoplasm of skin: Secondary | ICD-10-CM

## 2012-07-22 DIAGNOSIS — F411 Generalized anxiety disorder: Secondary | ICD-10-CM

## 2012-07-22 DIAGNOSIS — K219 Gastro-esophageal reflux disease without esophagitis: Secondary | ICD-10-CM

## 2012-07-22 DIAGNOSIS — N6459 Other signs and symptoms in breast: Secondary | ICD-10-CM

## 2012-07-22 DIAGNOSIS — N6452 Nipple discharge: Secondary | ICD-10-CM

## 2012-07-22 DIAGNOSIS — Z124 Encounter for screening for malignant neoplasm of cervix: Secondary | ICD-10-CM

## 2012-07-22 DIAGNOSIS — I10 Essential (primary) hypertension: Secondary | ICD-10-CM

## 2012-07-22 DIAGNOSIS — I2510 Atherosclerotic heart disease of native coronary artery without angina pectoris: Secondary | ICD-10-CM

## 2012-07-22 DIAGNOSIS — D239 Other benign neoplasm of skin, unspecified: Secondary | ICD-10-CM

## 2012-07-22 DIAGNOSIS — D229 Melanocytic nevi, unspecified: Secondary | ICD-10-CM

## 2012-07-22 DIAGNOSIS — E039 Hypothyroidism, unspecified: Secondary | ICD-10-CM

## 2012-07-22 DIAGNOSIS — Z0000 Encounter for general adult medical examination without abnormal findings: Secondary | ICD-10-CM

## 2012-07-22 DIAGNOSIS — I6529 Occlusion and stenosis of unspecified carotid artery: Secondary | ICD-10-CM

## 2012-07-22 DIAGNOSIS — E785 Hyperlipidemia, unspecified: Secondary | ICD-10-CM

## 2012-07-22 LAB — POCT URINALYSIS DIPSTICK
Blood, UA: NEGATIVE
Glucose, UA: NEGATIVE
Nitrite, UA: NEGATIVE
Protein, UA: NEGATIVE
Spec Grav, UA: 1.01
Urobilinogen, UA: 0.2
pH, UA: 7

## 2012-07-22 LAB — HEPATIC FUNCTION PANEL
ALT: 16 U/L (ref 0–35)
Bilirubin, Direct: 0 mg/dL (ref 0.0–0.3)
Total Bilirubin: 0.4 mg/dL (ref 0.3–1.2)
Total Protein: 7.2 g/dL (ref 6.0–8.3)

## 2012-07-22 LAB — BASIC METABOLIC PANEL
BUN: 11 mg/dL (ref 6–23)
Creatinine, Ser: 0.8 mg/dL (ref 0.4–1.2)
GFR: 72.87 mL/min (ref >60.00)
Glucose, Bld: 72 mg/dL (ref 70–99)

## 2012-07-22 LAB — LIPID PANEL
Cholesterol: 163 mg/dL (ref 0–200)
HDL: 75.1 mg/dL (ref >39.00)
LDL Cholesterol: 63 mg/dL (ref 0–99)
Total CHOL/HDL Ratio: 2
Triglycerides: 124 mg/dL (ref 0.0–149.0)
VLDL: 24.8 mg/dL (ref 0.0–40.0)

## 2012-07-22 LAB — CBC WITH DIFFERENTIAL/PLATELET
Basophils Absolute: 0 10*3/uL (ref 0.0–0.1)
Eosinophils Absolute: 0.4 10*3/uL (ref 0.0–0.7)
Eosinophils Relative: 6 % — ABNORMAL HIGH (ref 0.0–5.0)
Hemoglobin: 12.4 g/dL (ref 12.0–15.0)
MCV: 94 fl (ref 78.0–100.0)
Monocytes Absolute: 0.5 10*3/uL (ref 0.1–1.0)
Monocytes Relative: 8.3 % (ref 3.0–12.0)
Neutro Abs: 3.8 10*3/uL (ref 1.4–7.7)
Neutrophils Relative %: 62.4 % (ref 43.0–77.0)
Platelets: 239 10*3/uL (ref 150.0–400.0)
RDW: 14.6 % (ref 11.5–14.6)
WBC: 6.2 10*3/uL (ref 4.5–10.5)

## 2012-07-22 MED ORDER — ZOSTER VACCINE LIVE 19400 UNT/0.65ML ~~LOC~~ SOLR: 0.6500 mL | Freq: Once | SUBCUTANEOUS | Status: AC

## 2012-07-22 NOTE — Assessment & Plan Note (Signed)
Check labs con't meds 

## 2012-07-22 NOTE — Assessment & Plan Note (Signed)
con't meds 

## 2012-07-22 NOTE — Assessment & Plan Note (Signed)
Refer to derm for skin check 

## 2012-07-22 NOTE — Patient Instructions (Signed)
Preventive Care for Adults, Female A healthy lifestyle and preventive care can promote health and wellness. Preventive health guidelines for women include the following key practices.  A routine yearly physical is a good way to check with your caregiver about your health and preventive screening. It is a chance to share any concerns and updates on your health, and to receive a thorough exam.   Visit your dentist for a routine exam and preventive care every 6 months. Brush your teeth twice a day and floss once a day. Good oral hygiene prevents tooth decay and gum disease.   The frequency of eye exams is based on your age, health, family medical history, use of contact lenses, and other factors. Follow your caregiver's recommendations for frequency of eye exams.   Eat a healthy diet. Foods like vegetables, fruits, whole grains, low-fat dairy products, and lean protein foods contain the nutrients you need without too many calories. Decrease your intake of foods high in solid fats, added sugars, and salt. Eat the right amount of calories for you.Get information about a proper diet from your caregiver, if necessary.   Regular physical exercise is one of the most important things you can do for your health. Most adults should get at least 150 minutes of moderate-intensity exercise (any activity that increases your heart rate and causes you to sweat) each week. In addition, most adults need muscle-strengthening exercises on 2 or more days a week.   Maintain a healthy weight. The body mass index (BMI) is a screening tool to identify possible weight problems. It provides an estimate of body fat based on height and weight. Your caregiver can help determine your BMI, and can help you achieve or maintain a healthy weight.For adults 20 years and older:   A BMI below 18.5 is considered underweight.   A BMI of 18.5 to 24.9 is normal.   A BMI of 25 to 29.9 is considered overweight.   A BMI of 30 and above is  considered obese.   Maintain normal blood lipids and cholesterol levels by exercising and minimizing your intake of saturated fat. Eat a balanced diet with plenty of fruit and vegetables. Blood tests for lipids and cholesterol should begin at age 20 and be repeated every 5 years. If your lipid or cholesterol levels are high, you are over 50, or you are at high risk for heart disease, you may need your cholesterol levels checked more frequently.Ongoing high lipid and cholesterol levels should be treated with medicines if diet and exercise are not effective.   If you smoke, find out from your caregiver how to quit. If you do not use tobacco, do not start.   If you are pregnant, do not drink alcohol. If you are breastfeeding, be very cautious about drinking alcohol. If you are not pregnant and choose to drink alcohol, do not exceed 1 drink per day. One drink is considered to be 12 ounces (355 mL) of beer, 5 ounces (148 mL) of wine, or 1.5 ounces (44 mL) of liquor.   Avoid use of street drugs. Do not share needles with anyone. Ask for help if you need support or instructions about stopping the use of drugs.   High blood pressure causes heart disease and increases the risk of stroke. Your blood pressure should be checked at least every 1 to 2 years. Ongoing high blood pressure should be treated with medicines if weight loss and exercise are not effective.   If you are 55 to 67   years old, ask your caregiver if you should take aspirin to prevent strokes.   Diabetes screening involves taking a blood sample to check your fasting blood sugar level. This should be done once every 3 years, after age 45, if you are within normal weight and without risk factors for diabetes. Testing should be considered at a younger age or be carried out more frequently if you are overweight and have at least 1 risk factor for diabetes.   Breast cancer screening is essential preventive care for women. You should practice "breast  self-awareness." This means understanding the normal appearance and feel of your breasts and may include breast self-examination. Any changes detected, no matter how small, should be reported to a caregiver. Women in their 20s and 30s should have a clinical breast exam (CBE) by a caregiver as part of a regular health exam every 1 to 3 years. After age 40, women should have a CBE every year. Starting at age 40, women should consider having a mammography (breast X-ray test) every year. Women who have a family history of breast cancer should talk to their caregiver about genetic screening. Women at a high risk of breast cancer should talk to their caregivers about having magnetic resonance imaging (MRI) and a mammography every year.   The Pap test is a screening test for cervical cancer. A Pap test can show cell changes on the cervix that might become cervical cancer if left untreated. A Pap test is a procedure in which cells are obtained and examined from the lower end of the uterus (cervix).   Women should have a Pap test starting at age 21.   Between ages 21 and 29, Pap tests should be repeated every 2 years.   Beginning at age 30, you should have a Pap test every 3 years as long as the past 3 Pap tests have been normal.   Some women have medical problems that increase the chance of getting cervical cancer. Talk to your caregiver about these problems. It is especially important to talk to your caregiver if a new problem develops soon after your last Pap test. In these cases, your caregiver may recommend more frequent screening and Pap tests.   The above recommendations are the same for women who have or have not gotten the vaccine for human papillomavirus (HPV).   If you had a hysterectomy for a problem that was not cancer or a condition that could lead to cancer, then you no longer need Pap tests. Even if you no longer need a Pap test, a regular exam is a good idea to make sure no other problems are  starting.   If you are between ages 65 and 70, and you have had normal Pap tests going back 10 years, you no longer need Pap tests. Even if you no longer need a Pap test, a regular exam is a good idea to make sure no other problems are starting.   If you have had past treatment for cervical cancer or a condition that could lead to cancer, you need Pap tests and screening for cancer for at least 20 years after your treatment.   If Pap tests have been discontinued, risk factors (such as a new sexual partner) need to be reassessed to determine if screening should be resumed.   The HPV test is an additional test that may be used for cervical cancer screening. The HPV test looks for the virus that can cause the cell changes on the cervix.   The cells collected during the Pap test can be tested for HPV. The HPV test could be used to screen women aged 30 years and older, and should be used in women of any age who have unclear Pap test results. After the age of 30, women should have HPV testing at the same frequency as a Pap test.   Colorectal cancer can be detected and often prevented. Most routine colorectal cancer screening begins at the age of 50 and continues through age 75. However, your caregiver may recommend screening at an earlier age if you have risk factors for colon cancer. On a yearly basis, your caregiver may provide home test kits to check for hidden blood in the stool. Use of a small camera at the end of a tube, to directly examine the colon (sigmoidoscopy or colonoscopy), can detect the earliest forms of colorectal cancer. Talk to your caregiver about this at age 50, when routine screening begins. Direct examination of the colon should be repeated every 5 to 10 years through age 75, unless early forms of pre-cancerous polyps or small growths are found.   Hepatitis C blood testing is recommended for all people born from 1945 through 1965 and any individual with known risks for hepatitis C.    Practice safe sex. Use condoms and avoid high-risk sexual practices to reduce the spread of sexually transmitted infections (STIs). STIs include gonorrhea, chlamydia, syphilis, trichomonas, herpes, HPV, and human immunodeficiency virus (HIV). Herpes, HIV, and HPV are viral illnesses that have no cure. They can result in disability, cancer, and death. Sexually active women aged 25 and younger should be checked for chlamydia. Older women with new or multiple partners should also be tested for chlamydia. Testing for other STIs is recommended if you are sexually active and at increased risk.   Osteoporosis is a disease in which the bones lose minerals and strength with aging. This can result in serious bone fractures. The risk of osteoporosis can be identified using a bone density scan. Women ages 65 and over and women at risk for fractures or osteoporosis should discuss screening with their caregivers. Ask your caregiver whether you should take a calcium supplement or vitamin D to reduce the rate of osteoporosis.   Menopause can be associated with physical symptoms and risks. Hormone replacement therapy is available to decrease symptoms and risks. You should talk to your caregiver about whether hormone replacement therapy is right for you.   Use sunscreen with sun protection factor (SPF) of 30 or more. Apply sunscreen liberally and repeatedly throughout the day. You should seek shade when your shadow is shorter than you. Protect yourself by wearing long sleeves, pants, a wide-brimmed hat, and sunglasses year round, whenever you are outdoors.   Once a month, do a whole body skin exam, using a mirror to look at the skin on your back. Notify your caregiver of new moles, moles that have irregular borders, moles that are larger than a pencil eraser, or moles that have changed in shape or color.   Stay current with required immunizations.   Influenza. You need a dose every fall (or winter). The composition of  the flu vaccine changes each year, so being vaccinated once is not enough.   Pneumococcal polysaccharide. You need 1 to 2 doses if you smoke cigarettes or if you have certain chronic medical conditions. You need 1 dose at age 65 (or older) if you have never been vaccinated.   Tetanus, diphtheria, pertussis (Tdap, Td). Get 1 dose of   Tdap vaccine if you are younger than age 65, are over 65 and have contact with an infant, are a healthcare worker, are pregnant, or simply want to be protected from whooping cough. After that, you need a Td booster dose every 10 years. Consult your caregiver if you have not had at least 3 tetanus and diphtheria-containing shots sometime in your life or have a deep or dirty wound.   HPV. You need this vaccine if you are a woman age 26 or younger. The vaccine is given in 3 doses over 6 months.   Measles, mumps, rubella (MMR). You need at least 1 dose of MMR if you were born in 1957 or later. You may also need a second dose.   Meningococcal. If you are age 19 to 21 and a first-year college student living in a residence hall, or have one of several medical conditions, you need to get vaccinated against meningococcal disease. You may also need additional booster doses.   Zoster (shingles). If you are age 60 or older, you should get this vaccine.   Varicella (chickenpox). If you have never had chickenpox or you were vaccinated but received only 1 dose, talk to your caregiver to find out if you need this vaccine.   Hepatitis A. You need this vaccine if you have a specific risk factor for hepatitis A virus infection or you simply wish to be protected from this disease. The vaccine is usually given as 2 doses, 6 to 18 months apart.   Hepatitis B. You need this vaccine if you have a specific risk factor for hepatitis B virus infection or you simply wish to be protected from this disease. The vaccine is given in 3 doses, usually over 6 months.  Preventive Services /  Frequency Ages 19 to 39  Blood pressure check.** / Every 1 to 2 years.   Lipid and cholesterol check.** / Every 5 years beginning at age 20.   Clinical breast exam.** / Every 3 years for women in their 20s and 30s.   Pap test.** / Every 2 years from ages 21 through 29. Every 3 years starting at age 30 through age 65 or 70 with a history of 3 consecutive normal Pap tests.   HPV screening.** / Every 3 years from ages 30 through ages 65 to 70 with a history of 3 consecutive normal Pap tests.   Hepatitis C blood test.** / For any individual with known risks for hepatitis C.   Skin self-exam. / Monthly.   Influenza immunization.** / Every year.   Pneumococcal polysaccharide immunization.** / 1 to 2 doses if you smoke cigarettes or if you have certain chronic medical conditions.   Tetanus, diphtheria, pertussis (Tdap, Td) immunization. / A one-time dose of Tdap vaccine. After that, you need a Td booster dose every 10 years.   HPV immunization. / 3 doses over 6 months, if you are 26 and younger.   Measles, mumps, rubella (MMR) immunization. / You need at least 1 dose of MMR if you were born in 1957 or later. You may also need a second dose.   Meningococcal immunization. / 1 dose if you are age 19 to 21 and a first-year college student living in a residence hall, or have one of several medical conditions, you need to get vaccinated against meningococcal disease. You may also need additional booster doses.   Varicella immunization.** / Consult your caregiver.   Hepatitis A immunization.** / Consult your caregiver. 2 doses, 6 to 18 months   apart.   Hepatitis B immunization.** / Consult your caregiver. 3 doses usually over 6 months.  Ages 40 to 64  Blood pressure check.** / Every 1 to 2 years.   Lipid and cholesterol check.** / Every 5 years beginning at age 20.   Clinical breast exam.** / Every year after age 40.   Mammogram.** / Every year beginning at age 40 and continuing for as  long as you are in good health. Consult with your caregiver.   Pap test.** / Every 3 years starting at age 30 through age 65 or 70 with a history of 3 consecutive normal Pap tests.   HPV screening.** / Every 3 years from ages 30 through ages 65 to 70 with a history of 3 consecutive normal Pap tests.   Fecal occult blood test (FOBT) of stool. / Every year beginning at age 50 and continuing until age 75. You may not need to do this test if you get a colonoscopy every 10 years.   Flexible sigmoidoscopy or colonoscopy.** / Every 5 years for a flexible sigmoidoscopy or every 10 years for a colonoscopy beginning at age 50 and continuing until age 75.   Hepatitis C blood test.** / For all people born from 1945 through 1965 and any individual with known risks for hepatitis C.   Skin self-exam. / Monthly.   Influenza immunization.** / Every year.   Pneumococcal polysaccharide immunization.** / 1 to 2 doses if you smoke cigarettes or if you have certain chronic medical conditions.   Tetanus, diphtheria, pertussis (Tdap, Td) immunization.** / A one-time dose of Tdap vaccine. After that, you need a Td booster dose every 10 years.   Measles, mumps, rubella (MMR) immunization. / You need at least 1 dose of MMR if you were born in 1957 or later. You may also need a second dose.   Varicella immunization.** / Consult your caregiver.   Meningococcal immunization.** / Consult your caregiver.   Hepatitis A immunization.** / Consult your caregiver. 2 doses, 6 to 18 months apart.   Hepatitis B immunization.** / Consult your caregiver. 3 doses, usually over 6 months.  Ages 65 and over  Blood pressure check.** / Every 1 to 2 years.   Lipid and cholesterol check.** / Every 5 years beginning at age 20.   Clinical breast exam.** / Every year after age 40.   Mammogram.** / Every year beginning at age 40 and continuing for as long as you are in good health. Consult with your caregiver.   Pap test.** /  Every 3 years starting at age 30 through age 65 or 70 with a 3 consecutive normal Pap tests. Testing can be stopped between 65 and 70 with 3 consecutive normal Pap tests and no abnormal Pap or HPV tests in the past 10 years.   HPV screening.** / Every 3 years from ages 30 through ages 65 or 70 with a history of 3 consecutive normal Pap tests. Testing can be stopped between 65 and 70 with 3 consecutive normal Pap tests and no abnormal Pap or HPV tests in the past 10 years.   Fecal occult blood test (FOBT) of stool. / Every year beginning at age 50 and continuing until age 75. You may not need to do this test if you get a colonoscopy every 10 years.   Flexible sigmoidoscopy or colonoscopy.** / Every 5 years for a flexible sigmoidoscopy or every 10 years for a colonoscopy beginning at age 50 and continuing until age 75.   Hepatitis   C blood test.** / For all people born from 1945 through 1965 and any individual with known risks for hepatitis C.   Osteoporosis screening.** / A one-time screening for women ages 65 and over and women at risk for fractures or osteoporosis.   Skin self-exam. / Monthly.   Influenza immunization.** / Every year.   Pneumococcal polysaccharide immunization.** / 1 dose at age 65 (or older) if you have never been vaccinated.   Tetanus, diphtheria, pertussis (Tdap, Td) immunization. / A one-time dose of Tdap vaccine if you are over 65 and have contact with an infant, are a healthcare worker, or simply want to be protected from whooping cough. After that, you need a Td booster dose every 10 years.   Varicella immunization.** / Consult your caregiver.   Meningococcal immunization.** / Consult your caregiver.   Hepatitis A immunization.** / Consult your caregiver. 2 doses, 6 to 18 months apart.   Hepatitis B immunization.** / Check with your caregiver. 3 doses, usually over 6 months.  ** Family history and personal history of risk and conditions may change your caregiver's  recommendations. Document Released: 02/06/2002 Document Revised: 11/30/2011 Document Reviewed: 05/08/2011 [employer] Patient Information 2012 [employer]. 

## 2012-07-22 NOTE — Assessment & Plan Note (Addendum)
Stable con't meds 

## 2012-07-22 NOTE — Telephone Encounter (Signed)
I spoke with Theodoro Grist with [facility]. He states the patient has told him she has an order from Dr. Clifton James at home to discontinue her oxygen. In reviewing her chart, I do not see this documented in his last office note. I will forward to Dr. Gibson Ramp nurse to review when she is back on Wednesday. Fax # is [phone] for [facility].

## 2012-07-22 NOTE — Assessment & Plan Note (Signed)
Per cardiology Cont' meds Check labs

## 2012-07-22 NOTE — Progress Notes (Signed)
Subjective:    [name] is a 66 y.o. female who presents for Medicare Annual/Subsequent preventive examination.  Preventive Screening-Counseling & Management  Tobacco History  Smoking status  . Former Smoker -- 0 years  . Quit date: 05/27/2012  Smokeless tobacco  . Never Used     Problems Prior to Visit 1.   Current Problems (verified) Patient Active Problem List  Diagnosis  . HYPOTHYROIDISM  . GENERALIZED ANXIETY DISORDER  . HYPERTENSION  . CAD, NATIVE VESSEL  . CAROTID ARTERY DISEASE  . SINUSITIS  . CONSTIPATION  . Mastodynia  . FIBROMYALGIA  . FATIGUE  . DYSPNEA ON EXERTION  . CHEST PAIN, NON-CARDIAC  . SKIN CANCER, HX OF  . FIBROCYSTIC BREAST DISEASE, HX OF  . TOBACCO ABUSE, HX OF  . POSTMENOPAUSAL STATUS  . SINUSITIS - ACUTE-NOS  . ARM PAIN, LEFT  . Osteoporosis  . Poison ivy  . Acute UTI  . Back pain  . Constipation - functional  . Nonspecific (abnormal) findings on radiological and other examination of biliary tract  . Stricture and stenosis of esophagus  . Edema  . COPD exacerbation  . Acute bronchitis  . Hypothyroidism  . GERD (gastroesophageal reflux disease)  . CAD (coronary artery disease)  . Respiratory failure with hypoxia  . Tobacco abuse    Medications Prior to Visit Current Outpatient Prescriptions on File Prior to Visit  Medication Sig Dispense Refill  . albuterol (PROVENTIL HFA;VENTOLIN HFA) 108 (90 BASE) MCG/ACT inhaler Inhale 2 puffs into the lungs every 4 (four) hours as needed.      Marland Kitchen amLODipine (NORVASC) 5 MG tablet prn      . aspirin 81 MG tablet Take 81 mg by mouth daily.        Marland Kitchen atenolol (TENORMIN) 50 MG tablet Take 1 tablet (50 mg total) by mouth daily.  90 tablet  3  . B Complex Vitamins (B-COMPLEX/B-12 PO) Take 1 tablet by mouth daily.        . beclomethasone (QVAR) 40 MCG/ACT inhaler Inhale 2 puffs into the lungs 2 (two) times daily as needed.      . Calcium Carb-Cholecalciferol (CALCIUM 1000 + D PO) Take 2  tablets by mouth daily.        . Cholecalciferol (VITAMIN D) 1000 UNITS capsule Take 1,000 Units by mouth daily.        Marland Kitchen desloratadine (CLARINEX) 5 MG tablet Take 5 mg by mouth daily as needed.       Tery Sanfilippo Calcium (STOOL SOFTENER PO) Take by mouth. As needed       . DULoxetine (CYMBALTA) 60 MG capsule Take 1 capsule (60 mg total) by mouth 2 (two) times daily.  90 capsule  3  . esomeprazole (NEXIUM) 40 MG capsule Take 1 capsule 30 min before breakfast. Lot# [id number] Exp date: 03/2014  30 capsule  0  . estrogens, conjugated, (PREMARIN) 0.9 MG tablet 1 po qd  90 tablet  3  . furosemide (LASIX) 20 MG tablet As needed      . guaiFENesin (MUCINEX) 600 MG 12 hr tablet Take 1 tablet (600 mg total) by mouth 2 (two) times daily.  30 tablet  0  . imipramine (TOFRANIL) 25 MG tablet Take 1 tablet (25 mg total) by mouth at bedtime.  90 tablet  0  . mometasone (NASONEX) 50 MCG/ACT nasal spray 2 sprays by Nasal route daily.        . Multiple Vitamin (MULTIVITAMIN) tablet Take 1 tablet by mouth daily.       Marland Kitchen  nitroGLYCERIN (NITROSTAT) 0.4 MG SL tablet Place 0.4 mg under the tongue every 5 (five) minutes as needed.        . polyethylene glycol (MIRALAX / GLYCOLAX) packet Take 17 g by mouth daily.       . Probiotic Product (PA PROBIOTIC COMPLEX) TABS Take 1 tablet by mouth daily.        . rosuvastatin (CRESTOR) 10 MG tablet Take 1 tablet (10 mg total) by mouth daily.  90 tablet  3  . traMADol (ULTRAM) 50 MG tablet 1-2 po every 6 hours prn  60 tablet  0  . traZODone (DESYREL) 100 MG tablet Take 1 tablet (100 mg total) by mouth at bedtime.  90 tablet  3  . ibuprofen (ADVIL,MOTRIN) 200 MG tablet Take 800 mg by mouth every 6 (six) hours as needed. For pain        Current Medications (verified) Current Outpatient Prescriptions  Medication Sig Dispense Refill  . albuterol (PROVENTIL HFA;VENTOLIN HFA) 108 (90 BASE) MCG/ACT inhaler Inhale 2 puffs into the lungs every 4 (four) hours as needed.      Marland Kitchen amLODipine  (NORVASC) 5 MG tablet prn      . aspirin 81 MG tablet Take 81 mg by mouth daily.        Marland Kitchen atenolol (TENORMIN) 50 MG tablet Take 1 tablet (50 mg total) by mouth daily.  90 tablet  3  . B Complex Vitamins (B-COMPLEX/B-12 PO) Take 1 tablet by mouth daily.        . beclomethasone (QVAR) 40 MCG/ACT inhaler Inhale 2 puffs into the lungs 2 (two) times daily as needed.      . Calcium Carb-Cholecalciferol (CALCIUM 1000 + D PO) Take 2 tablets by mouth daily.        . Cholecalciferol (VITAMIN D) 1000 UNITS capsule Take 1,000 Units by mouth daily.        Marland Kitchen desloratadine (CLARINEX) 5 MG tablet Take 5 mg by mouth daily as needed.       Tery Sanfilippo Calcium (STOOL SOFTENER PO) Take by mouth. As needed       . DULoxetine (CYMBALTA) 60 MG capsule Take 1 capsule (60 mg total) by mouth 2 (two) times daily.  90 capsule  3  . esomeprazole (NEXIUM) 40 MG capsule Take 1 capsule 30 min before breakfast. Lot# [id number] Exp date: 03/2014  30 capsule  0  . estrogens, conjugated, (PREMARIN) 0.9 MG tablet 1 po qd  90 tablet  3  . furosemide (LASIX) 20 MG tablet As needed      . guaiFENesin (MUCINEX) 600 MG 12 hr tablet Take 1 tablet (600 mg total) by mouth 2 (two) times daily.  30 tablet  0  . imipramine (TOFRANIL) 25 MG tablet Take 1 tablet (25 mg total) by mouth at bedtime.  90 tablet  0  . mometasone (NASONEX) 50 MCG/ACT nasal spray 2 sprays by Nasal route daily.        . Multiple Vitamin (MULTIVITAMIN) tablet Take 1 tablet by mouth daily.       . nitroGLYCERIN (NITROSTAT) 0.4 MG SL tablet Place 0.4 mg under the tongue every 5 (five) minutes as needed.        . polyethylene glycol (MIRALAX / GLYCOLAX) packet Take 17 g by mouth daily.       . Probiotic Product (PA PROBIOTIC COMPLEX) TABS Take 1 tablet by mouth daily.        . rosuvastatin (CRESTOR) 10 MG tablet Take 1 tablet (10 mg  total) by mouth daily.  90 tablet  3  . traMADol (ULTRAM) 50 MG tablet 1-2 po every 6 hours prn  60 tablet  0  . traZODone (DESYREL) 100 MG  tablet Take 1 tablet (100 mg total) by mouth at bedtime.  90 tablet  3  . ibuprofen (ADVIL,MOTRIN) 200 MG tablet Take 800 mg by mouth every 6 (six) hours as needed. For pain      . zoster vaccine live, PF, (ZOSTAVAX) 40981 UNT/0.65ML injection Inject 19,400 Units into the skin once.  1 each  0     Allergies (verified) Clindamycin hcl; Penicillins; Prednisone; Codeine; Pentazocine lactate; and Pneumococcal vaccine polyvalent   PAST HISTORY  Family History Family History  Problem Relation Age of Onset  . Rectal cancer Mother   . Cancer Mother     rectal,  breast  . Irritable bowel syndrome Son   . Irritable bowel syndrome Daughter   . Heart disease Other   . Arthritis Other   . Cancer Other     breast cancer 1st degree relative <50, lung  . Diabetes Other     1st degree relative   . Hyperlipidemia Other   . Hypertension Other   . Osteoporosis Other   . Coronary artery disease Other 65  . Cancer Maternal Aunt     breast    Social History History  Substance Use Topics  . Smoking status: Former Smoker -- 0 years    Quit date: 05/27/2012  . Smokeless tobacco: Never Used  . Alcohol Use: Yes     occasional     Are there smokers in your home (other than you)? No  Risk Factors Current exercise habits: Gym/ health club routine includes cardio, low impact aerobics and walking on track .  Dietary issues discussed: na   Cardiac risk factors: advanced age (older than 92 for men, 21 for women), dyslipidemia and hypertension.  Depression Screen (Note: if answer to either of the following is "Yes", a more complete depression screening is indicated)   Over the past two weeks, have you felt down, depressed or hopeless? No  Over the past two weeks, have you felt little interest or pleasure in doing things? No  Have you lost interest or pleasure in daily life? No  Do you often feel hopeless? No  Do you cry easily over simple problems? No  Activities of Daily Living In your  present state of health, do you have any difficulty performing the following activities?:  Driving? No Managing money?  No Feeding yourself? No Getting from bed to chair? No Climbing a flight of stairs? No Preparing food and eating?: No Bathing or showering? No Getting dressed: No Getting to the toilet? No Using the toilet:No Moving around from place to place: No In the past year have you fallen or had a near fall?:No   Are you sexually active?  Yes  Do you have more than one partner?  No  Hearing Difficulties: No Do you often ask people to speak up or repeat themselves? No Do you experience ringing or noises in your ears? No Do you have difficulty understanding soft or whispered voices? No   Do you feel that you have a problem with memory? No  Do you often misplace items? No  Do you feel safe at home?  Yes  Cognitive Testing  Alert? Yes  Normal Appearance?Yes  Oriented to person? Yes  Place? Yes   Time? Yes  Recall of three objects?  Yes  Can perform simple calculations? Yes  Displays appropriate judgment?Yes  Can read the correct time from a watch face?Yes   Advanced Directives have been discussed with the patient? Yes  List the Names of Other Physician/Practitioners you currently use: 1. Cardiology-- [facility] 2. Neurology-- meyers  3.  oph--davis 4. Dentist-- Dental work 5  GI -- kaplan Indicate any recent Medical Services you may have received from other than [facility] providers in the past year (date may be approximate).  Immunization History  Administered Date(s) Administered  . Influenza Split 10/10/2011  . Influenza Whole 10/18/2010  . Pneumococcal Polysaccharide 01/27/2011    Screening Tests Health Maintenance  Topic Date Due  . Tetanus/tdap  06/29/1964  . Zostavax  06/29/2005  . Influenza Vaccine  09/24/2012  . Pap Smear  01/27/2013  . Mammogram  01/21/2014  . Colonoscopy  09/29/2020  . Pneumococcal Polysaccharide Vaccine Age 33 And Over  Completed    All  answers were reviewed with the patient and necessary referrals were made:  Loreen Freud, DO   07/22/2012   History reviewed: allergies, current medications, past family history, past medical history, past social history, past surgical history and problem list  Review of Systems  Review of Systems  Constitutional: Negative for activity change, appetite change and fatigue.  HENT: Negative for hearing loss, congestion, tinnitus and ear discharge.   Eyes: Negative for visual disturbance (see optho q1y -- vision corrected to 20/20 with glasses).  Respiratory: Negative for cough, chest tightness and shortness of breath.   Cardiovascular: Negative for chest pain, palpitations and leg swelling.  Gastrointestinal: Negative for abdominal pain, diarrhea, constipation and abdominal distention.  Genitourinary: Negative for urgency, frequency, decreased urine volume and difficulty urinating.  Musculoskeletal: Negative for back pain, arthralgias and gait problem.  Skin: Negative for color change, pallor and rash.  Neurological: Negative for dizziness, light-headedness, numbness and headaches.  Hematological: Negative for adenopathy. Does not bruise/bleed easily.  Psychiatric/Behavioral: Negative for suicidal ideas, confusion, sleep disturbance, self-injury, dysphoric mood, decreased concentration and agitation.  Pt is able to read and write and can do all ADLs No risk for falling No abuse/ violence in home     Objective:     Vision by Snellen chart: opth  Body mass index is 19.98 kg/(m^2). BP 120/72  Pulse 74  Temp 98.4 F (36.9 C) (Oral)  Ht 5\' 4"  (1.626 m)  Wt 116 lb 6.4 oz (52.799 kg)  BMI 19.98 kg/m2  SpO2 97%  BP 120/72  Pulse 74  Temp 98.4 F (36.9 C) (Oral)  Ht 5\' 4"  (1.626 m)  Wt 116 lb 6.4 oz (52.799 kg)  BMI 19.98 kg/m2  SpO2 97%  General Appearance:    Alert, cooperative, no distress, appears stated age  Head:    Normocephalic, without obvious abnormality, atraumatic    Eyes:    PERRL, conjunctiva/corneas clear, EOM's intact, fundi    benign, both eyes  Ears:    Normal TM's and external ear canals, both ears  Nose:   Nares normal, septum midline, mucosa normal, no drainage    or sinus tenderness  Throat:   Lips, mucosa, and tongue normal; teeth and gums normal  Neck:   Supple, symmetrical, trachea midline, no adenopathy;    thyroid:  no enlargement/tenderness/nodules; no carotid   bruit or JVD  Back:     Symmetric, no curvature, ROM normal, no CVA tenderness  Lungs:     Clear to auscultation bilaterally, respirations unlabored  Chest Wall:    No tenderness  or deformity   Heart:    Regular rate and rhythm, S1 and S2 normal, no murmur, rub   or gallop  Breast Exam:    No tenderness, masses, + breast d/c R breast  Abdomen:     Soft, non-tender, bowel sounds active all four quadrants,    no masses, no organomegaly  Genitalia:    Normal female without lesion, discharge or tenderness, pap done  Rectal:    Normal tone, , no masses or tenderness;   guaiac negative stool  Extremities:   Extremities normal, atraumatic, no cyanosis or edema  Pulses:   2+ and symmetric all extremities  Skin:   Skin color, texture, turgor normal, no rashes or lesions  Lymph nodes:   Cervical, supraclavicular, and axillary nodes normal  Neurologic:   CNII-XII intact, normal strength, sensation and reflexes    throughout       Assessment:     cpe      Plan:     During the course of the visit the patient was educated and counseled about appropriate screening and preventive services including:    Pneumococcal vaccine   Influenza vaccine  Td vaccine  Screening mammography  Screening Pap smear and pelvic exam   Bone densitometry screening  Colorectal cancer screening  Advanced directives: has an advanced directive - a copy HAS NOT been provided.  Diet review for nutrition referral? Yes ____  Not Indicated __x__   Patient Instructions (the written plan) was  given to the patient.  Medicare Attestation I have personally reviewed: The patient's medical and social history Their use of alcohol, tobacco or illicit drugs Their current medications and supplements The patient's functional ability including ADLs,fall risks, home safety risks, cognitive, and hearing and visual impairment Diet and physical activities Evidence for depression or mood disorders  The patient's weight, height, BMI, and visual acuity have been recorded in the chart.  I have made referrals, counseling, and provided education to the patient based on review of the above and I have provided the patient with a written personalized care plan for preventive services.     Loreen Freud, DO   07/22/2012

## 2012-07-22 NOTE — Telephone Encounter (Signed)
New Problem:    Called about the discontinuation of the patient's Oxygen because they never received an initial prescription.  Please call back.

## 2012-07-22 NOTE — Assessment & Plan Note (Signed)
Weaning off cymbalta

## 2012-07-23 ENCOUNTER — Encounter: Payer: Self-pay | Admitting: Family Medicine

## 2012-07-25 NOTE — Telephone Encounter (Signed)
Spoke with pt and confirmed she has written prescription from Dr. Clifton James to discontinue oxygen. She states she is feeling well.  [facility] needs prescription faxed to them before they can go out to pt's home to pick up oxygen.  I told pt and Theodoro Grist from [facility] that I would fax prescription today indicating pt may stop oxygen to fax number below.

## 2012-07-29 ENCOUNTER — Ambulatory Visit
Admission: RE | Admit: 2012-07-29 | Discharge: 2012-07-29 | Disposition: A | Discharge status: Home / Self Care | Payer: Medicare Other | Source: Ambulatory Visit | Attending: Family Medicine | Admitting: Family Medicine

## 2012-07-29 DIAGNOSIS — N6452 Nipple discharge: Secondary | ICD-10-CM

## 2012-07-29 MED ORDER — GADOBENATE DIMEGLUMINE 529 MG/ML IV SOLN
10.0000 mL | Freq: Once | INTRAVENOUS | Status: AC | PRN
  Administered 2012-07-29: 10 mL via INTRAVENOUS

## 2012-07-31 ENCOUNTER — Encounter: Payer: Self-pay | Admitting: Family Medicine

## 2012-08-02 ENCOUNTER — Other Ambulatory Visit (HOSPITAL_COMMUNITY): Payer: Self-pay | Admitting: *Deleted

## 2012-08-05 ENCOUNTER — Encounter: Payer: Self-pay | Admitting: Emergency Medicine

## 2012-08-05 ENCOUNTER — Ambulatory Visit (INDEPENDENT_AMBULATORY_CARE_PROVIDER_SITE_OTHER): Payer: Medicare Other | Admitting: Emergency Medicine

## 2012-08-05 ENCOUNTER — Other Ambulatory Visit: Payer: Self-pay | Admitting: Family Medicine

## 2012-08-05 VITALS — BP 118/80 | HR 83 | Temp 98.3°F | Ht 64.0 in | Wt 116.2 lb

## 2012-08-05 DIAGNOSIS — G4700 Insomnia, unspecified: Secondary | ICD-10-CM

## 2012-08-05 DIAGNOSIS — J45909 Unspecified asthma, uncomplicated: Secondary | ICD-10-CM

## 2012-08-05 HISTORY — DX: Unspecified asthma, uncomplicated: J45.909

## 2012-08-05 MED ORDER — TRAZODONE HCL 100 MG PO TABS: 100.0000 mg | ORAL_TABLET | Freq: Every day | ORAL | Status: DC

## 2012-08-05 NOTE — Telephone Encounter (Signed)
Last filled 08/15/11 #90 with 3 refills. Please advise     KP

## 2012-08-05 NOTE — Patient Instructions (Addendum)
Please continue to have your albuterol available to use 2 puffs if needed for shortness of breath We will perform full pulmonary function testing at your follow up visit Follow up next available appointment with PFT on the same day

## 2012-08-05 NOTE — Progress Notes (Signed)
Subjective:    Patient ID: [name], female    DOB: 14-May-1946, 66 y.o.   MRN: [id number]  HPI 66 yo woman, former smoker (10-15pk-yrs), hx allergic rhinitis, HTN, GERD, depression, tachyarrhythmia, non-obstructive CAD, per cath 07/2010, s/p Hysterectomy, s/p Throat cyst removal, s/p skin cancer removal in past, admitted to [facility] ED on 06/03/12 with cc of persistent cough, worsening SOB and wheeze, dx with COPD exacerbation, but also some question of whether she may have had cardiac dz - subsequent myoview and TTE have been reassuring [name]). In retrospect she now mentions that she has always been sensitive to fumes, allergies even as a girl - never dx with asthma officially.  Exacerbating factors appear to be fumes, allergies, GERD.   Review of Systems  Constitutional: Positive for activity change, appetite change and unexpected weight change. Negative for fever, chills, diaphoresis and fatigue.  HENT: Positive for voice change and sinus pressure. Negative for nosebleeds, congestion, sore throat, rhinorrhea, sneezing, mouth sores, trouble swallowing, postnasal drip and ear discharge.   Eyes: Negative for pain and visual disturbance.  Respiratory: Negative for cough, choking, chest tightness, shortness of breath and wheezing.   Cardiovascular: Negative for chest pain and palpitations.  Gastrointestinal: Positive for constipation. Negative for nausea, vomiting, abdominal pain and blood in stool.  Genitourinary: Negative for dysuria, urgency, frequency, decreased urine volume and difficulty urinating.  Musculoskeletal: Negative for myalgias, back pain and gait problem.  Skin: Negative for rash.  Neurological: Positive for headaches. Negative for dizziness, seizures, syncope, speech difficulty, weakness, light-headedness and numbness.  Hematological: Does not bruise/bleed easily.  Psychiatric/Behavioral: Negative for confusion, disturbed wake/sleep cycle and agitation. The patient is not  nervous/anxious.     Past Medical History  Diagnosis Date  . GERD (gastroesophageal reflux disease)   . Arthritis   . Asthma   . Skin cancer   . Hypertension   . Migraine   . History of colonic polyps   . Hypothyroidism   . Bronchitis     chronic  . Irregular heartbeat   . CAD (coronary artery disease)     mild non-obstructive cad     Family History  Problem Relation Age of Onset  . Rectal cancer Mother   . Cancer Mother     rectal,  breast  . Irritable bowel syndrome Son   . Irritable bowel syndrome Daughter   . Heart disease Other   . Arthritis Other   . Cancer Other     breast cancer 1st degree relative <50, lung  . Diabetes Other     1st degree relative   . Hyperlipidemia Other   . Hypertension Other   . Osteoporosis Other   . Coronary artery disease Other 65  . Cancer Maternal Aunt     breast     History   Social History  . Marital Status: Married    Spouse Name: N/A    Number of Children: 3  . Years of Education: N/A   Occupational History  . Retired Charity fundraiser    Social History Main Topics  . Smoking status: Former Smoker -- 0.2 packs/day for 0 years    Types: Cigarettes    Quit date: 05/27/2012  . Smokeless tobacco: Never Used   Comment: 1 pack per week  . Alcohol Use: Yes     occasional  . Drug Use: No  . Sexually Active: Not on file   Other Topics Concern  . Not on file   Social History Narrative   Exercise--  2days a week [employer],  Water aerobics, walking      Allergies  Allergen Reactions  . Clindamycin Hcl Shortness Of Breath and Rash  . Penicillins Anaphylaxis  . Prednisone Shortness Of Breath  . Codeine Hives, Itching and Other (See Comments)    headache  . Pentazocine Lactate   . Pneumococcal Vaccine Polyvalent     REACTION: redness, swelling, and hives at injection site     Outpatient Prescriptions Prior to Visit  Medication Sig Dispense Refill  . amLODipine (NORVASC) 5 MG tablet prn      . aspirin 81 MG tablet Take 81 mg by  mouth daily.        Marland Kitchen atenolol (TENORMIN) 50 MG tablet Take 1 tablet (50 mg total) by mouth daily.  90 tablet  3  . B Complex Vitamins (B-COMPLEX/B-12 PO) Take 1 tablet by mouth daily.        . Calcium Carb-Cholecalciferol (CALCIUM 1000 + D PO) Take 2 tablets by mouth daily.        . Cholecalciferol (VITAMIN D) 1000 UNITS capsule Take 1,000 Units by mouth daily.        Tery Sanfilippo Calcium (STOOL SOFTENER PO) Take by mouth. As needed       . DULoxetine (CYMBALTA) 60 MG capsule Take 1 capsule (60 mg total) by mouth 2 (two) times daily.  90 capsule  3  . esomeprazole (NEXIUM) 40 MG capsule Take 1 capsule 30 min before breakfast. Lot# [id number] Exp date: 03/2014  30 capsule  0  . estrogens, conjugated, (PREMARIN) 0.9 MG tablet 1 po qd  90 tablet  3  . furosemide (LASIX) 20 MG tablet As needed      . ibuprofen (ADVIL,MOTRIN) 200 MG tablet Take 800 mg by mouth every 6 (six) hours as needed. For pain      . imipramine (TOFRANIL) 25 MG tablet Take 1 tablet (25 mg total) by mouth at bedtime.  90 tablet  0  . mometasone (NASONEX) 50 MCG/ACT nasal spray Place 2 sprays into the nose daily as needed.       . Multiple Vitamin (MULTIVITAMIN) tablet Take 1 tablet by mouth daily.       . polyethylene glycol (MIRALAX / GLYCOLAX) packet Take 17 g by mouth daily.       . Probiotic Product (PA PROBIOTIC COMPLEX) TABS Take 1 tablet by mouth daily.        . rosuvastatin (CRESTOR) 10 MG tablet Take 1 tablet (10 mg total) by mouth daily.  90 tablet  3  . traMADol (ULTRAM) 50 MG tablet 1-2 po every 6 hours prn  60 tablet  0  . traZODone (DESYREL) 100 MG tablet Take 1 tablet (100 mg total) by mouth at bedtime.  90 tablet  3  . albuterol (PROVENTIL HFA;VENTOLIN HFA) 108 (90 BASE) MCG/ACT inhaler Inhale 2 puffs into the lungs every 4 (four) hours as needed.      . beclomethasone (QVAR) 40 MCG/ACT inhaler Inhale 2 puffs into the lungs 2 (two) times daily as needed.      . desloratadine (CLARINEX) 5 MG tablet Take 5 mg by  mouth daily as needed.       Marland Kitchen guaiFENesin (MUCINEX) 600 MG 12 hr tablet Take 1 tablet (600 mg total) by mouth 2 (two) times daily.  30 tablet  0  . nitroGLYCERIN (NITROSTAT) 0.4 MG SL tablet Place 0.4 mg under the tongue every 5 (five) minutes as needed.  Objective:   Physical Exam Filed Vitals:   08/05/12 1434  BP: 118/80  Pulse: 83  Temp: 98.3 F (36.8 C)   Gen: Pleasant, well-nourished, in no distress,  normal affect  ENT: No lesions,  mouth clear,  oropharynx clear, no postnasal drip  Neck: No JVD, no TMG, no carotid bruits  Lungs: No use of accessory muscles, no dullness to percussion, clear without rales or rhonchi  Cardiovascular: RRR, heart sounds normal, no murmur or gallops, no peripheral edema  Musculoskeletal: No deformities, no cyanosis or clubbing  Neuro: alert, non focal  Skin: Warm, no lesions or rashes     Assessment & Plan:  Asthma, allergic Suspect intermittent asthma given her longstanding hx symptoms and her minimal tobacco hx.  - full PFT  - albuterol prn - rov next available

## 2012-08-05 NOTE — Assessment & Plan Note (Signed)
Suspect intermittent asthma given her longstanding hx symptoms and her minimal tobacco hx.  - full PFT  - albuterol prn - rov next available

## 2012-08-05 NOTE — Telephone Encounter (Signed)
TRAZODONE TABLET 100MG  QTY:90 TAKE 1 TABLET AT BEDTIME

## 2012-08-05 NOTE — Telephone Encounter (Signed)
Refill x1 

## 2012-08-06 ENCOUNTER — Encounter (HOSPITAL_COMMUNITY)
Admission: RE | Admit: 2012-08-06 | Discharge: 2012-08-06 | Disposition: A | Discharge status: Home / Self Care | Payer: Medicare Other | Source: Ambulatory Visit | Attending: Family Medicine | Admitting: Family Medicine

## 2012-08-06 ENCOUNTER — Encounter (HOSPITAL_COMMUNITY): Payer: Self-pay

## 2012-08-06 ENCOUNTER — Encounter: Payer: Self-pay | Admitting: Family Medicine

## 2012-08-06 DIAGNOSIS — M810 Age-related osteoporosis without current pathological fracture: Secondary | ICD-10-CM | POA: Insufficient documentation

## 2012-08-06 MED ORDER — SODIUM CHLORIDE 0.9 % IV SOLN
Freq: Once | INTRAVENOUS | Status: AC
  Administered 2012-08-06: 15:00:00 via INTRAVENOUS

## 2012-08-06 MED ORDER — ZOLEDRONIC ACID 5 MG/100ML IV SOLN
5.0000 mg | Freq: Once | INTRAVENOUS | Status: AC
  Administered 2012-08-06: 5 mg via INTRAVENOUS
  Filled 2012-08-06: qty 600

## 2012-08-07 ENCOUNTER — Telehealth: Payer: Self-pay | Admitting: Family Medicine

## 2012-08-07 NOTE — Telephone Encounter (Signed)
Received 4 pages from [facility], sent to Dr. Laury Axon 08/07/12/SD

## 2012-08-09 ENCOUNTER — Encounter: Payer: Self-pay | Admitting: Family Medicine

## 2012-08-16 ENCOUNTER — Other Ambulatory Visit: Payer: Self-pay | Admitting: Family Medicine

## 2012-08-16 DIAGNOSIS — R928 Other abnormal and inconclusive findings on diagnostic imaging of breast: Secondary | ICD-10-CM

## 2012-08-23 ENCOUNTER — Ambulatory Visit
Admission: RE | Admit: 2012-08-23 | Discharge: 2012-08-23 | Disposition: A | Discharge status: Home / Self Care | Payer: Medicare Other | Source: Ambulatory Visit | Attending: Family Medicine | Admitting: Family Medicine

## 2012-08-23 ENCOUNTER — Other Ambulatory Visit: Payer: Self-pay | Admitting: Family Medicine

## 2012-08-23 DIAGNOSIS — R928 Other abnormal and inconclusive findings on diagnostic imaging of breast: Secondary | ICD-10-CM

## 2012-08-23 HISTORY — PX: OTHER SURGICAL HISTORY: SHX169

## 2012-08-23 MED ORDER — GADOBENATE DIMEGLUMINE 529 MG/ML IV SOLN
10.0000 mL | Freq: Once | INTRAVENOUS | Status: AC | PRN
  Administered 2012-08-23: 10 mL via INTRAVENOUS

## 2012-08-27 ENCOUNTER — Ambulatory Visit
Admission: RE | Admit: 2012-08-27 | Discharge: 2012-08-27 | Disposition: A | Discharge status: Home / Self Care | Payer: Medicare Other | Source: Ambulatory Visit | Attending: Family Medicine | Admitting: Family Medicine

## 2012-08-27 DIAGNOSIS — R928 Other abnormal and inconclusive findings on diagnostic imaging of breast: Secondary | ICD-10-CM

## 2012-08-29 ENCOUNTER — Encounter (INDEPENDENT_AMBULATORY_CARE_PROVIDER_SITE_OTHER): Payer: Self-pay | Admitting: Surgery

## 2012-08-29 ENCOUNTER — Ambulatory Visit (INDEPENDENT_AMBULATORY_CARE_PROVIDER_SITE_OTHER): Payer: Medicare Other | Admitting: Surgery

## 2012-08-29 VITALS — BP 126/76 | HR 78 | Temp 98.8°F | Resp 18 | Ht 64.0 in | Wt 155.6 lb

## 2012-08-29 DIAGNOSIS — N6089 Other benign mammary dysplasias of unspecified breast: Secondary | ICD-10-CM

## 2012-08-29 DIAGNOSIS — N6099 Unspecified benign mammary dysplasia of unspecified breast: Secondary | ICD-10-CM

## 2012-08-29 NOTE — Progress Notes (Addendum)
Re:   [name] DOB:   1946-06-06 MRN:   [id number]  ASSESSMENT AND PLAN: 1.  Nipple discharge, right  Atypical ductal hyperplasia with Ca++ on core biopsy on 08/23/2012, this is apparently in the inferior aspect of the right breast.   The area of atypical ductal hyperplasia should be excised.  This was not seen on a mammogram or [place], but on a MRI of the breast.  I discussed lumpectomy with wire localization with the patient. The risks of surgery include, but are not limited to, bleeding, infection, the need for further surgery, and nerve injury.  There appears to be some confusion about the location of the ADH - so I need to review with [facility] about whether she needs re-imaging.  I want to wait at least 4 to 6 weeks before surgery, so the bruising of her right breast can resolve.  I want to see her back in the office about 1 week before surgery to recheck her breast.  [The area with the ADH is a 4 to 5 cm area in inferior (6 o'clock) right breast that sort of follows a duct.  Would be best bracketed using MRI. Discussed with Dr. Kandy Garrison.  DN 09/02/2012] 1a.  To stop Premarin until this is all cleared up.   2.  HTN. 3.  GERD   Saw Dr. Arlyce Dice, but she now manages this herself 4.  Depression 5.  Tachyarrhythmia, controlled on Atenolol  Saw Dr. Olene Floss 6.  CAD 7.  COPD/Asthma  Hospitalized in June 2013 for acute bronchitis, now released by Dr. Delton Coombes 8.  Fibromyalgia  Followed by Dr. Hyacinth Meeker in [facility] 9.  Multiple benign skin lesions - Dr. Alinda Sierras 10.  Quit smoking in June 2013.  Chief Complaint  Patient presents with  . New Evaluation    Rt BR   REFERRING PHYSICIAN: Loreen Freud, DO  HISTORY OF PRESENT ILLNESS: [name] is a 66 y.o. (DOB: 30-Sep-1946)  white female whose primary care physician is Loreen Freud, DO and comes to me today for right breast abnormality.  She noticed a right nipple discharge which started last October, 2012.  She has had biopsies  of both breast (3 on the right and 1 on the left) in the past.  She has 2 paternal aunts who had breast cancer (in their 53's), but no one on her maternal side has had breast cancer.  She had a hysterectomy and bilateral oophorectomy for endometriosis in 1981.  Then in 1991, she had cervical cancer treated by a Dr. Kennon Rounds in [place].  She has had no further problems.  She is on Premarin, which she has been on for years.  We have agreed to stop this until all the biopsies are in.  Mammogram - 07/24/2012 - [facility] - negative, dense breast Mammogram and [place] of breast - 06/12/2012 - [facility] - negative MRI breast - 07/29/2012 - two areas of concern in right breast, 7 o'clock and 9 o'clock  Due to the findings on the MRI, she had 3 biopsies of her right breast by Dr. Norris Cross on 08/23/2012.  Two biopsies proved benign, but the biopsy labeled "breast, right, (2B)" showed atypical ductal hyperplasia.  Dr. Edison Pace follow up discussion questioned the localization.  I discussed all these findings with the patient and her husband.  I will need to review with [facility] the findings.  Then we can set a time for surgery.   Past  Medical History  Diagnosis Date  . GERD (gastroesophageal reflux disease)   . Arthritis   . Asthma   . Hypertension   . Migraine   . History of colonic polyps   . Hypothyroidism   . Bronchitis     chronic  . Irregular heartbeat   . CAD (coronary artery disease)     mild non-obstructive cad  . Skin cancer   . Cervical cancer 1991    Tx  [id number]    Past Surgical History  Procedure Date  . Appendectomy   . Tonsillectomy   . Skin cancer removal   . Vocal cord cyst removal   . Nasal sinus surgery   . Right ankle   . Right hand surgery   . Abdominal hysterectomy   . Breast biopsy     multiple --all neg      Current Outpatient Prescriptions  Medication Sig Dispense Refill  . albuterol (PROVENTIL HFA;VENTOLIN HFA) 108 (90 BASE) MCG/ACT inhaler Inhale 2 puffs  into the lungs every 4 (four) hours as needed.      Marland Kitchen amLODipine (NORVASC) 5 MG tablet prn      . aspirin 81 MG tablet Take 81 mg by mouth daily.        Marland Kitchen atenolol (TENORMIN) 50 MG tablet Take 1 tablet (50 mg total) by mouth daily.  90 tablet  3  . B Complex Vitamins (B-COMPLEX/B-12 PO) Take 1 tablet by mouth daily.        . Calcium Carb-Cholecalciferol (CALCIUM 1000 + D PO) Take 2 tablets by mouth daily.        . Cholecalciferol (VITAMIN D) 1000 UNITS capsule Take 1,000 Units by mouth daily.        Tery Sanfilippo Calcium (STOOL SOFTENER PO) Take by mouth. As needed       . DULoxetine (CYMBALTA) 60 MG capsule Take 1 capsule (60 mg total) by mouth 2 (two) times daily.  90 capsule  3  . esomeprazole (NEXIUM) 40 MG capsule Take 1 capsule 30 min before breakfast. Lot# [id number] Exp date: 03/2014  30 capsule  0  . estrogens, conjugated, (PREMARIN) 0.9 MG tablet 1 po qd  90 tablet  3  . furosemide (LASIX) 20 MG tablet As needed      . ibuprofen (ADVIL,MOTRIN) 200 MG tablet Take 800 mg by mouth every 6 (six) hours as needed. For pain      . imipramine (TOFRANIL) 25 MG tablet Take 1 tablet (25 mg total) by mouth at bedtime.  90 tablet  0  . mometasone (NASONEX) 50 MCG/ACT nasal spray Place 2 sprays into the nose daily as needed.       . Multiple Vitamin (MULTIVITAMIN) tablet Take 1 tablet by mouth daily.       . polyethylene glycol (MIRALAX / GLYCOLAX) packet Take 17 g by mouth daily.       . Probiotic Product (PA PROBIOTIC COMPLEX) TABS Take 1 tablet by mouth daily.        . rosuvastatin (CRESTOR) 10 MG tablet Take 1 tablet (10 mg total) by mouth daily.  90 tablet  3  . traMADol (ULTRAM) 50 MG tablet 1-2 po every 6 hours prn  60 tablet  0  . traZODone (DESYREL) 100 MG tablet Take 1 tablet (100 mg total) by mouth at bedtime.  90 tablet  0      Allergies  Allergen Reactions  . Clindamycin Hcl Shortness Of Breath and Rash  . Penicillins Anaphylaxis  . Prednisone  Shortness Of Breath  . Codeine Hives,  Itching and Other (See Comments)    headache  . Latex Hives  . Pentazocine Lactate   . Pneumococcal Vaccine Polyvalent     REACTION: redness, swelling, and hives at injection site    REVIEW OF SYSTEMS: Skin:  Just had a bunch of skin lesions burned by Dr. Terri Piedra. Infection:  No history of hepatitis or HIV.  No history of MRSA. Neurologic:  No history of stroke.  No history of seizure.  No history of headaches. Cardiac:  HTN.  Had irregular heart rate, but controlled on Atenolol.  Had heart cath in 2011.  Followed by Dr. Sanjuana Kava. Pulmonary:  Hospitalized for bronchitis at [facility] in June 2013.  Quit smoking in June 2013. Endocrine:  No diabetes. No thyroid disease. Gastrointestinal:  GERD.  Had colonoscopy in 2011.  Urologic:  No history of kidney stones.  No history of bladder infections. Musculoskeletal:  Fibromyalgia, followed by Dr. Adline Peals. Hyacinth Meeker, neurology in [facility]. Hematologic:  No bleeding disorder.  No history of anemia.  Not anticoagulated. Psycho-social:  The patient is oriented.   The patient has no obvious psychologic or social impairment to understanding our conversation and plan.  SOCIAL and FAMILY HISTORY: Married. Husband with patient.  I took a cyst off her husband's back. She has 3 children:  26 female, 30 and 48 female  PHYSICAL EXAM: BP 126/76  Pulse 78  Temp 98.8 F (37.1 C) (Temporal)  Resp 18  Ht 5\' 4"  (1.626 m)  Wt 155 lb 9.6 oz (70.58 kg)  BMI 26.71 kg/m2  SpO2 99%  General: WN WF who is alert and generally healthy appearing.  HEENT: Normal. Pupils equal. Neck: Supple. No mass.  No thyroid mass. Lymph Nodes:  No supraclavicular or cervical nodes. Lungs: Clear to auscultation and symmetric breath sounds.  Multiple skin lesions taken from chest and abdomen by Dr. Terri Piedra. Breasts:  Right - Significantly bruised/ecchymotic right breast.  This limits exam.  She is tender in the medial aspect of the right breast.  Left - No mass Heart:  RRR. No murmur or  rub.  Abdomen: Soft. No mass. No tenderness. No hernia. Normal bowel sounds.  No abdominal scars. Rectal: Not done. Extremities:  Good strength and ROM  in upper and lower extremities. Neurologic:  Grossly intact to motor and sensory function. Psychiatric: Has normal mood and affect. Behavior is normal.   DATA REVIEWED: Reports of MRI and Path to patient.  Ovidio Kin, MD,  [facility] [address] [place]  [address]   [place], [place]    [address] Phone:  [phone] FAX:  [phone]

## 2012-09-02 ENCOUNTER — Other Ambulatory Visit: Payer: Self-pay | Admitting: Family Medicine

## 2012-09-02 ENCOUNTER — Encounter: Payer: Self-pay | Admitting: Family Medicine

## 2012-09-02 ENCOUNTER — Ambulatory Visit (INDEPENDENT_AMBULATORY_CARE_PROVIDER_SITE_OTHER): Payer: Medicare Other | Admitting: Family Medicine

## 2012-09-02 ENCOUNTER — Other Ambulatory Visit: Payer: Medicare Other

## 2012-09-02 VITALS — BP 118/80 | Temp 98.0°F | Wt 117.4 lb

## 2012-09-02 DIAGNOSIS — N6089 Other benign mammary dysplasias of unspecified breast: Secondary | ICD-10-CM

## 2012-09-02 DIAGNOSIS — F329 Major depressive disorder, single episode, unspecified: Secondary | ICD-10-CM

## 2012-09-02 DIAGNOSIS — D7389 Other diseases of spleen: Secondary | ICD-10-CM

## 2012-09-02 DIAGNOSIS — I10 Essential (primary) hypertension: Secondary | ICD-10-CM

## 2012-09-02 DIAGNOSIS — F32A Depression, unspecified: Secondary | ICD-10-CM

## 2012-09-02 DIAGNOSIS — J339 Nasal polyp, unspecified: Secondary | ICD-10-CM

## 2012-09-02 DIAGNOSIS — D739 Disease of spleen, unspecified: Secondary | ICD-10-CM

## 2012-09-02 DIAGNOSIS — N6099 Unspecified benign mammary dysplasia of unspecified breast: Secondary | ICD-10-CM

## 2012-09-02 HISTORY — DX: Other diseases of spleen: D73.89

## 2012-09-02 LAB — BASIC METABOLIC PANEL
BUN: 14 mg/dL (ref 6–23)
CO2: 29 mEq/L (ref 19–32)
Calcium: 8.5 mg/dL (ref 8.4–10.5)
GFR: 78.26 mL/min (ref >60.00)
Glucose, Bld: 72 mg/dL (ref 70–99)
Potassium: 4 mEq/L (ref 3.5–5.1)
Sodium: 137 mEq/L (ref 135–145)

## 2012-09-02 LAB — LIPID PANEL
Cholesterol: 161 mg/dL (ref 0–200)
HDL: 61.7 mg/dL (ref >39.00)
VLDL: 25.6 mg/dL (ref 0.0–40.0)

## 2012-09-02 LAB — BUN: BUN: 14 mg/dL (ref 6–23)

## 2012-09-02 LAB — CREATININE, SERUM: Creatinine, Ser: 0.8 mg/dL (ref 0.4–1.2)

## 2012-09-02 MED ORDER — DULOXETINE HCL 60 MG PO CPEP: 60.0000 mg | ORAL_CAPSULE | Freq: Two times a day (BID) | ORAL | Status: DC

## 2012-09-02 NOTE — Patient Instructions (Signed)
Galactorrhea Galactorrhea is when there is a milky nipple discharge. It is different from normal milk in nursing mothers. It usually comes from both nipples. Galactorrhea is not a disease but may be a symptom of a problem. It may continue for years after weaning. Galactorrhea is caused by the hormone prolactin, which stimulates milk production. If the breast discharge looks like pus, is bloody or if there is a lump present in the affected breast, the discharge may be caused by other problems including:  A benign cyst.   Papilloma.   Breast cancer.   A breast infection.   A breast abscess.  It can also be seen in men who have a low or absent female hormone (testosterone) level. Galactorrhea can be present in a newborn if the mother had high female hormone (estrogen) levels that crossed into the baby through the placenta. The baby usually has enlarged breasts, but in time, it all goes away on its own. CAUSES   Tumor of the pituitary gland in the brain.   Problems with the hypothalamus in the brain that stimulates the pituitary gland.   Low thyroid function (hypothyroid disease).   Chronic kidney failure.   Medications, antidepressants, tranquilizers and blood pressure medication.   Herbal medications (nettle, fennel, blessed thistle, anise and fenugreek seed).   Illegal drugs (marijuana and opiates).   Breast stimulation during sexual activity or too many and frequent self breast exams.   Birth control pills.   Surgery or trauma to the breast causing nerve damage.   Spinal cord injury.  SYMPTOMS   White, yellow or green discharge from one or both breasts.   No menstrual period (amenorrhea) or infrequent menstrual periods (hypomenorrhea).   Hot flashes, lack of sexual desire or vaginal dryness.   Infertility in women and men.   Headaches and vision problems.   Decrease in calcium in your bones (developing osteopenia or osteoporosis).  DIAGNOSIS  Your caregiver may be able  to know your problem by taking a detailed history and physical exam of you. Tests that may be done, include:  Blood tests to check for the prolactin hormone, your female and thyroid hormones and a pregnancy test.   A detailed eye exam.   Mammogram.   X-rays, CT scan or MRI of breasts or your brain looking for a tumor.  TREATMENT   Stopping medications that may be causing the galactorrhea.   Treating low thyroid function with thyroid hormones.   Medical or surgical (if necessary) treatment of a pituitary gland tumor.   Medication to lower the prolactin hormone level when no cause can be found.   Surgery as a last resort to remove the breasts ducts if the discharge persists with treatment and is a problem.   Treatment may not be necessary if you are not bothered by the breast discharge.  HOME CARE INSTRUCTIONS   Before seeing your caregiver, make a list of all your symptoms, medications, when the breast discharge started and questions you may have.   Avoid breast stimulation during sexual activity.   Perform breast self exam once a month.   Avoid clothes that rub on your nipples.   Use breasts pads to absorb the discharge.   Wear a support bra.  SEEK MEDICAL CARE IF:   You have galactorrhea and you are trying to get pregnant.   You develop hot flashes, vaginal dryness or lack of sexual desire.   You stop having menstrual periods or they are irregular or far apart.  You have headaches.   You have vision problems.  SEEK IMMEDIATE MEDICAL CARE IF:   Your breast discharge is bloody or pus-like.   You have breast pain.   You feel a lump in your breast.   Your breast shows wrinkling or dimpling.   Your breast becomes red and swollen.  Document Released: 01/18/2005 Document Revised: 11/30/2011 Document Reviewed: 12/01/2008 [facility] Patient Information 2012 [place], [place].

## 2012-09-02 NOTE — Assessment & Plan Note (Signed)
Mri ordered 

## 2012-09-02 NOTE — Assessment & Plan Note (Signed)
con't w/u Dr Ezzard Standing and radiology

## 2012-09-02 NOTE — Progress Notes (Signed)
  Subjective:    Patient ID: [name], female    DOB: Feb 25, 1946, 66 y.o.   MRN: [id number]  HPI Pt here to discuss biopsy and mri results.   Surgeon would like her to get the mri to evaluate splenic lesion.     Review of Systems As above    Objective:   Physical Exam  Constitutional: She is oriented to person, place, and time. She appears well-developed and well-nourished.  Neurological: She is alert and oriented to person, place, and time.  Psychiatric: She has a normal mood and affect. Her behavior is normal. Judgment and thought content normal.          Assessment & Plan:

## 2012-09-03 ENCOUNTER — Ambulatory Visit: Payer: Medicare Other | Admitting: Emergency Medicine

## 2012-09-04 ENCOUNTER — Other Ambulatory Visit (INDEPENDENT_AMBULATORY_CARE_PROVIDER_SITE_OTHER): Payer: Self-pay | Admitting: Surgery

## 2012-09-04 DIAGNOSIS — N6099 Unspecified benign mammary dysplasia of unspecified breast: Secondary | ICD-10-CM

## 2012-09-05 ENCOUNTER — Ambulatory Visit
Admission: RE | Admit: 2012-09-05 | Discharge: 2012-09-05 | Disposition: A | Discharge status: Home / Self Care | Payer: Medicare Other | Source: Ambulatory Visit | Attending: Family Medicine | Admitting: Family Medicine

## 2012-09-05 DIAGNOSIS — D7389 Other diseases of spleen: Secondary | ICD-10-CM

## 2012-09-05 MED ORDER — GADOBENATE DIMEGLUMINE 529 MG/ML IV SOLN
10.0000 mL | Freq: Once | INTRAVENOUS | Status: AC | PRN
  Administered 2012-09-05: 10 mL via INTRAVENOUS

## 2012-09-06 ENCOUNTER — Ambulatory Visit: Payer: Medicare Other | Admitting: Emergency Medicine

## 2012-09-06 ENCOUNTER — Ambulatory Visit (INDEPENDENT_AMBULATORY_CARE_PROVIDER_SITE_OTHER): Payer: Medicare Other | Admitting: General Surgery

## 2012-09-06 ENCOUNTER — Encounter (INDEPENDENT_AMBULATORY_CARE_PROVIDER_SITE_OTHER): Payer: Self-pay | Admitting: General Surgery

## 2012-09-06 VITALS — BP 136/86 | HR 82 | Temp 97.4°F | Ht 65.0 in | Wt 115.6 lb

## 2012-09-06 DIAGNOSIS — N63 Unspecified lump in unspecified breast: Secondary | ICD-10-CM

## 2012-09-06 NOTE — Progress Notes (Signed)
Subjective:     Patient ID: [name], female   DOB: [birth year], 65 y.o.   MRN: [id number]  HPI The patient is a 66 year old female who has been seen by Dr. Ezzard Standing after a breast biopsy.  The patient is concerned secondary to increasing tenderness to the right breast as well as a knot that was felt on self-examination.  The patient otherwise is noticed no drainage from the previous biopsy site.  Review of Systems  Constitutional: Negative.   HENT: Negative.   Eyes: Negative.   Respiratory: Negative.   Cardiovascular: Negative.   Gastrointestinal: Negative.   Musculoskeletal: Negative.   Neurological: Negative.        Objective:   Physical Exam  Constitutional: She is oriented to person, place, and time. She appears well-developed and well-nourished.  HENT:  Head: Normocephalic and atraumatic.  Eyes: Conjunctivae normal are normal. Pupils are equal, round, and reactive to light.  Neck: Normal range of motion. Neck supple.  Cardiovascular: Normal rate, regular rhythm and normal heart sounds.   Pulmonary/Chest: Effort normal and breath sounds normal.       Bruising to her right breast, confirm what seems to be hematoma deep in the breast with tenderness to palpation  Musculoskeletal: Normal range of motion.  Neurological: She is alert and oriented to person, place, and time.       Assessment:     23-year-old female status post breast biopsy with hematoma.    Plan:     1. At this time I did not see any type of hematoma that could be aspirated. Recommend continue observation of her right breast with supportive dressings to help the pain at this time.  2. Continue followup with Dr. Ezzard Standing as previously scheduled.

## 2012-09-13 ENCOUNTER — Other Ambulatory Visit: Payer: Self-pay | Admitting: Family Medicine

## 2012-09-13 NOTE — Telephone Encounter (Signed)
Last seen 09/02/12 last filled 08/05/12 #90... plz advise    MW

## 2012-09-20 ENCOUNTER — Encounter (INDEPENDENT_AMBULATORY_CARE_PROVIDER_SITE_OTHER): Payer: Medicare Other | Admitting: General Surgery

## 2012-09-24 ENCOUNTER — Encounter: Payer: Self-pay | Admitting: Emergency Medicine

## 2012-09-24 ENCOUNTER — Ambulatory Visit (INDEPENDENT_AMBULATORY_CARE_PROVIDER_SITE_OTHER): Payer: Medicare Other | Admitting: Emergency Medicine

## 2012-09-24 VITALS — BP 124/82 | HR 75 | Temp 98.7°F | Ht 65.0 in | Wt 114.4 lb

## 2012-09-24 DIAGNOSIS — J45909 Unspecified asthma, uncomplicated: Secondary | ICD-10-CM

## 2012-09-24 DIAGNOSIS — Z23 Encounter for immunization: Secondary | ICD-10-CM

## 2012-09-24 LAB — PULMONARY FUNCTION TEST

## 2012-09-24 NOTE — Assessment & Plan Note (Signed)
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 She rarely uses albuterol.  - defer any scheduled BD';s - albuterol prn  - rov 1 year or prn

## 2012-09-24 NOTE — Patient Instructions (Addendum)
Please continue to use your albuterol 2 puffs as needed Flu Shot today Follow with Dr Delton Coombes in 1 year or sooner if you have any problems.

## 2012-09-24 NOTE — Progress Notes (Signed)
  Subjective:    Patient ID: [name], female    DOB: 03/03/1946, 66 y.o.   MRN: [id number]  HPI 66 yo woman, former smoker (10-15pk-yrs), hx allergic rhinitis, HTN, GERD, depression, tachyarrhythmia, non-obstructive CAD, per cath 07/2010, s/p Hysterectomy, s/p Throat cyst removal, s/p skin cancer removal in past, admitted to [facility] ED on 06/03/12 with cc of persistent cough, worsening SOB and wheeze, dx with COPD exacerbation, but also some question of whether she may have had cardiac dz - subsequent myoview and TTE have been reassuring [name]). In retrospect she now mentions that she has always been sensitive to fumes, allergies even as a girl - never dx with asthma officially.  Exacerbating factors appear to be fumes, allergies, GERD.  ROV 09/24/12 -- follow up for SOB and cough, w hx throat cyst as above. 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 Nasonex was stopped. She also has seen [facility] regarding recurrence of her breast CA. She underwent PFT today that confirm moderately severe AFL with BD response. She has only intermittent sx, has rarely used albuterol. Her allergies, fumes, are triggers.    PULMONARY FUNCTON TEST 09/24/2012  FVC 2.45  FEV1 1.31  FEV1/FVC 53.5  FVC  % Predicted 81  FEV % Predicted 60  FeF 25-75 .48  FeF 25-75 % Predicted 2.44       Objective:   Physical Exam Filed Vitals:   09/24/12 1338  BP: 124/82  Pulse: 75  Temp: 98.7 F (37.1 C)   Gen: Pleasant, well-nourished, in no distress,  normal affect  ENT: No lesions,  mouth clear,  oropharynx clear, no postnasal drip  Neck: No JVD, no TMG, no carotid bruits  Lungs: No use of accessory muscles, no dullness to percussion, clear without rales or rhonchi  Cardiovascular: RRR, heart sounds normal, no murmur or gallops, no peripheral edema  Musculoskeletal: No deformities, no cyanosis or clubbing  Neuro: alert, non focal  Skin: Warm, no lesions or rashes     Assessment  & Plan:  Asthma, allergic Mild intermittent sx, 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 She rarely uses albuterol.  - defer any scheduled BD';s - albuterol prn  - rov 1 year or prn

## 2012-09-24 NOTE — Progress Notes (Signed)
PFT done today. 

## 2012-09-26 ENCOUNTER — Encounter (INDEPENDENT_AMBULATORY_CARE_PROVIDER_SITE_OTHER): Payer: Self-pay | Admitting: Surgery

## 2012-09-26 ENCOUNTER — Ambulatory Visit (INDEPENDENT_AMBULATORY_CARE_PROVIDER_SITE_OTHER): Payer: Medicare Other | Admitting: Surgery

## 2012-09-26 ENCOUNTER — Other Ambulatory Visit: Payer: Medicare Other

## 2012-09-26 VITALS — BP 112/74 | HR 72 | Temp 98.3°F | Resp 18 | Ht 65.0 in | Wt 115.6 lb

## 2012-09-26 DIAGNOSIS — N6089 Other benign mammary dysplasias of unspecified breast: Secondary | ICD-10-CM

## 2012-09-26 DIAGNOSIS — N6099 Unspecified benign mammary dysplasia of unspecified breast: Secondary | ICD-10-CM

## 2012-09-26 NOTE — Progress Notes (Signed)
Re:   [name] DOB:   1946/03/01 MRN:   [id number]  ASSESSMENT AND PLAN: 1.  Nipple discharge, right  Atypical ductal hyperplasia with Ca++ on core biopsy on 08/23/2012, this is apparently in the inferior aspect of the right breast.   The area of atypical ductal hyperplasia should be excised.  This was not seen on a mammogram or [place], but on a MRI of the breast.  I discussed lumpectomy with wire localization with the patient. The risks of surgery include, but are not limited to, bleeding, infection, the need for further surgery, and nerve injury.  She already knows what significant bruising is like.  The area with the ADH is a 4 to 5 cm area in inferior (6 o'clock) right breast that sort of follows a duct.  Would be best bracketed using MRI. Discussed with Dr. Kandy Garrison.    I delayed her biopsy because of significant breast bruising that she had with her core needle biopsy.  She is scheduled for 10/11/2012.  1a.  To stop Premarin until we have a final diagnosis.   2.  HTN. 3.  GERD   Saw Dr. Arlyce Dice, but she now manages this herself 4.  Depression 5.  Tachyarrhythmia, controlled on Atenolol  Saw Dr. Olene Floss 6.  CAD 7.  COPD/Asthma  Hospitalized in June 2013 for acute bronchitis, now released by Dr. Delton Coombes 8.  Fibromyalgia  Followed by Dr. Hyacinth Meeker in [facility] 9.  Multiple benign skin lesions - Dr. Alinda Sierras 10.  Quit smoking in June 2013.  Chief Complaint  Patient presents with  . Follow-up    rt breast   REFERRING PHYSICIAN: Loreen Freud, DO  HISTORY OF PRESENT ILLNESS: [name] is a 66 y.o. (DOB: [birth year])  white female whose primary care physician is Loreen Freud, DO and comes to me today for a pre op check of the right breast prior to MRI guided lumpectomy.  She came mainly to recheck her breast. She still has some pain and discomfort, but is doing much better than when I last saw her. I answered questions about the surgery, about the pathology, about the incision,  and about potential bruising. She will stop her aspirin one week prior to surgery.  She is going on a woman's retreat one week after surgery and had questions about that.  Breast History: She noticed a right nipple discharge which started last October, 2012.  She has had biopsies of both breast (3 on the right and 1 on the left) in the past.  She has 2 paternal aunts who had breast cancer (in their 56's), but no one on her maternal side has had breast cancer.  She had a hysterectomy and bilateral oophorectomy for endometriosis in 1981.  Then in 1991, she had cervical cancer treated by a Dr. Kennon Rounds in [place].  She has had no further problems.  She is on Premarin, which she has been on for years.  We have agreed to stop this until all the biopsies are in.  Mammogram - 07/24/2012 - [facility] - negative, dense breast Mammogram and [place] of breast - 06/12/2012 - [facility] - negative MRI breast - 07/29/2012 - two areas of concern in right breast, 7 o'clock and 9 o'clock  Due to the findings on the MRI, she had 3 biopsies of her right breast by Dr. Norris Cross on 08/23/2012.  Two biopsies proved benign, but the biopsy labeled "breast, right, (2B)" showed atypical ductal hyperplasia.  I plan to excise that area of the breast.   Past Medical History  Diagnosis Date  . GERD (gastroesophageal reflux disease)   . Arthritis   . Asthma   . Hypertension   . Migraine   . History of colonic polyps   . Hypothyroidism   . Bronchitis     chronic  . Irregular heartbeat   . CAD (coronary artery disease)     mild non-obstructive cad  . Skin cancer   . Cervical cancer 1991    Tx  F5U    Past Surgical History  Procedure Date  . Appendectomy   . Tonsillectomy   . Skin cancer removal   . Vocal cord cyst removal   . Nasal sinus surgery   . Right ankle   . Right hand surgery   . Abdominal hysterectomy   . Breast biopsy     multiple --all neg     Current Outpatient Prescriptions  Medication Sig  Dispense Refill  . albuterol (PROVENTIL HFA;VENTOLIN HFA) 108 (90 BASE) MCG/ACT inhaler Inhale 2 puffs into the lungs every 4 (four) hours as needed.      Marland Kitchen aspirin 81 MG tablet Take 81 mg by mouth daily.        Marland Kitchen atenolol (TENORMIN) 50 MG tablet Take 50 mg by mouth daily. 1/2 tablet daily      . B Complex Vitamins (B-COMPLEX/B-12 PO) Take 1 tablet by mouth daily.        . Calcium Carb-Cholecalciferol (CALCIUM 1000 + D PO) Take 2 tablets by mouth daily.        . Cholecalciferol (VITAMIN D) 1000 UNITS capsule Take 1,000 Units by mouth daily.        Tery Sanfilippo Calcium (STOOL SOFTENER PO) Take by mouth. As needed       . DULoxetine (CYMBALTA) 60 MG capsule Take 1 capsule (60 mg total) by mouth 2 (two) times daily.  180 capsule  3  . esomeprazole (NEXIUM) 40 MG capsule Take 1 capsule 30 min before breakfast. Lot# [id number] Exp date: 03/2014  30 capsule  0  . furosemide (LASIX) 20 MG tablet As needed      . ibuprofen (ADVIL,MOTRIN) 200 MG tablet Take 800 mg by mouth every 6 (six) hours as needed. For pain      . imipramine (TOFRANIL) 25 MG tablet Take 1 tablet (25 mg total) by mouth at bedtime.  90 tablet  0  . mometasone (NASONEX) 50 MCG/ACT nasal spray Place 2 sprays into the nose daily as needed.       . Multiple Vitamin (MULTIVITAMIN) tablet Take 1 tablet by mouth daily.       . polyethylene glycol (MIRALAX / GLYCOLAX) packet Take 17 g by mouth daily.       . Probiotic Product (PA PROBIOTIC COMPLEX) TABS Take 1 tablet by mouth daily.        . rosuvastatin (CRESTOR) 10 MG tablet Take 1 tablet (10 mg total) by mouth daily.  90 tablet  3  . traMADol (ULTRAM) 50 MG tablet 1-2 po every 6 hours prn  60 tablet  0  . traZODone (DESYREL) 100 MG tablet Take 1 tablet (100 mg total) by mouth at bedtime.  90 tablet  0      Allergies  Allergen Reactions  . Clindamycin Hcl Shortness Of Breath and Rash  . Penicillins Anaphylaxis  . Prednisone Shortness Of Breath  . Codeine Hives, Itching and Other (See  Comments)    headache  .  Latex Hives  . Pentazocine Lactate   . Pneumococcal Vaccine Polyvalent     REACTION: redness, swelling, and hives at injection site    REVIEW OF SYSTEMS: Skin:  Just had a bunch of skin lesions burned by Dr. Terri Piedra. Infection:  No history of hepatitis or HIV.  No history of MRSA. Neurologic:  No history of stroke.  No history of seizure.  No history of headaches. Cardiac:  HTN.  Had irregular heart rate, but controlled on Atenolol.  Had heart cath in 2011.  Followed by Dr. Sanjuana Kava. Pulmonary:  Hospitalized for bronchitis at [facility] in June 2013.  Quit smoking in June 2013. Endocrine:  No diabetes. No thyroid disease. Gastrointestinal:  GERD.  Had colonoscopy in 2011.  Urologic:  No history of kidney stones.  No history of bladder infections. Musculoskeletal:  Fibromyalgia, followed by Dr. Adline Peals. Hyacinth Meeker, neurology in [place]. Hematologic:  No bleeding disorder.  No history of anemia.  Not anticoagulated. Psycho-social:  The patient is oriented.   The patient has no obvious psychologic or social impairment to understanding our conversation and plan.  SOCIAL and FAMILY HISTORY: Married. Husband with patient.  I took a cyst off her husband's back. She has 3 children:  59 female, 62 and 39 female  PHYSICAL EXAM: BP 112/74  Pulse 72  Temp 98.3 F (36.8 C)  Resp 18  Ht 5\' 5"  (1.651 m)  Wt 115 lb 9.6 oz (52.436 kg)  BMI 19.24 kg/m2  General: WN WF who is alert and generally healthy appearing.  Breasts:  Right - Winona Legato is much better, but it is still lumpy and bruised.  I think though, she is ready to go ahead with surgery.  Left - No mass  DATA REVIEWED: No new report.  Ovidio Kin, MD,  [facility] [address]  [address]   [place], [place]    [address] Phone:  [phone] FAX:  [phone]

## 2012-10-07 NOTE — Progress Notes (Signed)
To come in for bmet-sees dr bynum for asthma-PFT 8/13-ok Cad-dr mcalhaney-stress and echo 7/13 To bring all meds and inhaler Pt was told by ent she has a hemangioma on vocal coed-has to be careful intubation-will bring a note.

## 2012-10-08 ENCOUNTER — Ambulatory Visit: Payer: Medicare Other | Admitting: Emergency Medicine

## 2012-10-09 ENCOUNTER — Encounter (HOSPITAL_BASED_OUTPATIENT_CLINIC_OR_DEPARTMENT_OTHER)
Admission: RE | Admit: 2012-10-09 | Discharge: 2012-10-09 | Disposition: A | Discharge status: Home / Self Care | Payer: Medicare Other | Source: Ambulatory Visit | Attending: Surgery | Admitting: Surgery

## 2012-10-09 LAB — BASIC METABOLIC PANEL WITH GFR
BUN: 15 mg/dL (ref 6–23)
CO2: 30 meq/L (ref 19–32)
Calcium: 8.9 mg/dL (ref 8.4–10.5)
Chloride: 99 meq/L (ref 96–112)
Creatinine, Ser: 0.78 mg/dL (ref 0.50–1.10)
GFR calc Af Amer: >90 mL/min
GFR calc non Af Amer: 85 mL/min — ABNORMAL LOW
Glucose, Bld: 69 mg/dL — ABNORMAL LOW (ref 70–99)
Potassium: 4.1 meq/L (ref 3.5–5.1)
Sodium: 139 meq/L (ref 135–145)

## 2012-10-11 ENCOUNTER — Encounter (HOSPITAL_BASED_OUTPATIENT_CLINIC_OR_DEPARTMENT_OTHER): Payer: Self-pay | Admitting: Certified Registered"

## 2012-10-11 ENCOUNTER — Other Ambulatory Visit (INDEPENDENT_AMBULATORY_CARE_PROVIDER_SITE_OTHER): Payer: Self-pay | Admitting: Surgery

## 2012-10-11 ENCOUNTER — Ambulatory Visit (HOSPITAL_BASED_OUTPATIENT_CLINIC_OR_DEPARTMENT_OTHER)
Admission: RE | Admit: 2012-10-11 | Discharge: 2012-10-11 | Disposition: A | Discharge status: Home / Self Care | Payer: Medicare Other | Source: Ambulatory Visit | Attending: Surgery | Admitting: Surgery

## 2012-10-11 ENCOUNTER — Ambulatory Visit
Admission: RE | Admit: 2012-10-11 | Discharge: 2012-10-11 | Disposition: A | Discharge status: Home / Self Care | Payer: Medicare Other | Source: Ambulatory Visit | Attending: Surgery | Admitting: Surgery

## 2012-10-11 ENCOUNTER — Other Ambulatory Visit: Payer: Medicare Other

## 2012-10-11 ENCOUNTER — Encounter (HOSPITAL_BASED_OUTPATIENT_CLINIC_OR_DEPARTMENT_OTHER)
Admission: RE | Disposition: A | Discharge status: Home / Self Care | Payer: Self-pay | Source: Ambulatory Visit | Attending: Surgery

## 2012-10-11 ENCOUNTER — Encounter: Payer: Self-pay | Admitting: Emergency Medicine

## 2012-10-11 ENCOUNTER — Encounter (HOSPITAL_BASED_OUTPATIENT_CLINIC_OR_DEPARTMENT_OTHER): Payer: Self-pay | Admitting: Anesthesiology

## 2012-10-11 ENCOUNTER — Encounter (HOSPITAL_BASED_OUTPATIENT_CLINIC_OR_DEPARTMENT_OTHER): Payer: Self-pay

## 2012-10-11 ENCOUNTER — Ambulatory Visit (HOSPITAL_BASED_OUTPATIENT_CLINIC_OR_DEPARTMENT_OTHER): Payer: Medicare Other | Admitting: Certified Registered"

## 2012-10-11 DIAGNOSIS — J4489 Other specified chronic obstructive pulmonary disease: Secondary | ICD-10-CM | POA: Insufficient documentation

## 2012-10-11 DIAGNOSIS — N6089 Other benign mammary dysplasias of unspecified breast: Secondary | ICD-10-CM | POA: Insufficient documentation

## 2012-10-11 DIAGNOSIS — N6099 Unspecified benign mammary dysplasia of unspecified breast: Secondary | ICD-10-CM

## 2012-10-11 DIAGNOSIS — J449 Chronic obstructive pulmonary disease, unspecified: Secondary | ICD-10-CM | POA: Insufficient documentation

## 2012-10-11 DIAGNOSIS — D0590 Unspecified type of carcinoma in situ of unspecified breast: Secondary | ICD-10-CM

## 2012-10-11 DIAGNOSIS — I10 Essential (primary) hypertension: Secondary | ICD-10-CM | POA: Insufficient documentation

## 2012-10-11 HISTORY — PX: BREAST LUMPECTOMY: SHX2

## 2012-10-11 HISTORY — PX: BREAST BIOPSY: SHX20

## 2012-10-11 SURGERY — BREAST LUMPECTOMY WITH NEEDLE LOCALIZATION
Anesthesia: General | Site: Breast | Laterality: Right | Wound class: Clean

## 2012-10-11 MED ORDER — MEPERIDINE HCL 25 MG/ML IJ SOLN: 6.2500 mg | INTRAMUSCULAR | Status: DC | PRN

## 2012-10-11 MED ORDER — CEFAZOLIN SODIUM-DEXTROSE 2-3 GM-% IV SOLR: 2.0000 g | INTRAVENOUS | Status: DC

## 2012-10-11 MED ORDER — MIDAZOLAM HCL 2 MG/2ML IJ SOLN: 0.5000 mg | Freq: Once | INTRAMUSCULAR | Status: DC | PRN

## 2012-10-11 MED ORDER — LIDOCAINE HCL (CARDIAC) 20 MG/ML IV SOLN
INTRAVENOUS | Status: DC | PRN
  Administered 2012-10-11: 10 mg via INTRAVENOUS

## 2012-10-11 MED ORDER — PROPOFOL 10 MG/ML IV BOLUS
INTRAVENOUS | Status: DC | PRN
  Administered 2012-10-11 (×2): 100 mg via INTRAVENOUS
  Administered 2012-10-11: 50 mg via INTRAVENOUS

## 2012-10-11 MED ORDER — GADOBENATE DIMEGLUMINE 529 MG/ML IV SOLN
10.0000 mL | Freq: Once | INTRAVENOUS | Status: AC | PRN
  Administered 2012-10-11: 10 mL via INTRAVENOUS

## 2012-10-11 MED ORDER — MIDAZOLAM HCL 5 MG/5ML IJ SOLN
INTRAMUSCULAR | Status: DC | PRN
  Administered 2012-10-11: 0.5 mg via INTRAVENOUS

## 2012-10-11 MED ORDER — TRAMADOL HCL 50 MG PO TABS: 50.0000 mg | ORAL_TABLET | Freq: Four times a day (QID) | ORAL | Status: DC | PRN

## 2012-10-11 MED ORDER — EPHEDRINE SULFATE 50 MG/ML IJ SOLN
INTRAMUSCULAR | Status: DC | PRN
  Administered 2012-10-11: 10 mg via INTRAVENOUS

## 2012-10-11 MED ORDER — CIPROFLOXACIN IN D5W 400 MG/200ML IV SOLN
400.0000 mg | Freq: Two times a day (BID) | INTRAVENOUS | Status: DC
  Administered 2012-10-11: 400 mg via INTRAVENOUS

## 2012-10-11 MED ORDER — ONDANSETRON HCL 4 MG/2ML IJ SOLN
INTRAMUSCULAR | Status: DC | PRN
  Administered 2012-10-11: 4 mg via INTRAVENOUS

## 2012-10-11 MED ORDER — HYDROMORPHONE HCL PF 1 MG/ML IJ SOLN
0.2500 mg | INTRAMUSCULAR | Status: DC | PRN
  Administered 2012-10-11: 0.5 mg via INTRAVENOUS
  Administered 2012-10-11: 0.25 mg via INTRAVENOUS

## 2012-10-11 MED ORDER — LACTATED RINGERS IV SOLN
INTRAVENOUS | Status: DC
  Administered 2012-10-11 (×2): via INTRAVENOUS

## 2012-10-11 MED ORDER — CHLORHEXIDINE GLUCONATE 4 % EX LIQD: 1.0000 "application " | Freq: Once | CUTANEOUS | Status: DC

## 2012-10-11 MED ORDER — BUPIVACAINE HCL (PF) 0.25 % IJ SOLN
INTRAMUSCULAR | Status: DC | PRN
  Administered 2012-10-11: 30 mL

## 2012-10-11 MED ORDER — OXYCODONE HCL 5 MG/5ML PO SOLN: 5.0000 mg | Freq: Once | ORAL | Status: DC | PRN

## 2012-10-11 MED ORDER — FENTANYL CITRATE 0.05 MG/ML IJ SOLN
INTRAMUSCULAR | Status: DC | PRN
  Administered 2012-10-11: 50 ug via INTRAVENOUS
  Administered 2012-10-11: 25 ug via INTRAVENOUS
  Administered 2012-10-11: 50 ug via INTRAVENOUS

## 2012-10-11 MED ORDER — PROMETHAZINE HCL 25 MG/ML IJ SOLN: 6.2500 mg | INTRAMUSCULAR | Status: DC | PRN

## 2012-10-11 MED ORDER — SUCCINYLCHOLINE CHLORIDE 20 MG/ML IJ SOLN
INTRAMUSCULAR | Status: DC | PRN
  Administered 2012-10-11: 120 mg via INTRAVENOUS

## 2012-10-11 MED ORDER — OXYCODONE HCL 5 MG PO TABS: 5.0000 mg | ORAL_TABLET | Freq: Once | ORAL | Status: DC | PRN

## 2012-10-11 SURGICAL SUPPLY — 50 items
BANDAGE ELASTIC 6 VELCRO ST LF (GAUZE/BANDAGES/DRESSINGS) IMPLANT
BENZOIN TINCTURE PRP APPL 2/3 (GAUZE/BANDAGES/DRESSINGS) IMPLANT
BINDER BREAST LRG (GAUZE/BANDAGES/DRESSINGS) IMPLANT
BINDER BREAST MEDIUM (GAUZE/BANDAGES/DRESSINGS) ×2 IMPLANT
BINDER BREAST XLRG (GAUZE/BANDAGES/DRESSINGS) IMPLANT
BINDER BREAST XXLRG (GAUZE/BANDAGES/DRESSINGS) IMPLANT
BLADE SURG 10 STRL SS (BLADE) IMPLANT
BLADE SURG 15 STRL LF DISP TIS (BLADE) IMPLANT
BLADE SURG 15 STRL SS (BLADE)
CANISTER SUCTION 1200CC (MISCELLANEOUS) ×2 IMPLANT
CHLORAPREP W/TINT 26ML (MISCELLANEOUS) ×2 IMPLANT
CLIP TI WIDE RED SMALL 6 (CLIP) ×2 IMPLANT
CLOTH BEACON ORANGE TIMEOUT ST (SAFETY) ×2 IMPLANT
COVER MAYO STAND STRL (DRAPES) ×2 IMPLANT
COVER TABLE BACK 60X90 (DRAPES) ×2 IMPLANT
DECANTER SPIKE VIAL GLASS SM (MISCELLANEOUS) IMPLANT
DERMABOND ADVANCED (GAUZE/BANDAGES/DRESSINGS) ×1
DERMABOND ADVANCED .7 DNX12 (GAUZE/BANDAGES/DRESSINGS) ×1 IMPLANT
DEVICE DUBIN W/COMP PLATE 8390 (MISCELLANEOUS) ×2 IMPLANT
DRAPE PED LAPAROTOMY (DRAPES) ×2 IMPLANT
DRAPE UTILITY XL STRL (DRAPES) ×2 IMPLANT
ELECT COATED BLADE 2.86 ST (ELECTRODE) ×2 IMPLANT
ELECT NEEDLE TIP 2.8 STRL (NEEDLE) IMPLANT
ELECT REM PT RETURN 9FT ADLT (ELECTROSURGICAL) ×2
ELECTRODE REM PT RTRN 9FT ADLT (ELECTROSURGICAL) ×1 IMPLANT
GAUZE SPONGE 4X4 12PLY STRL LF (GAUZE/BANDAGES/DRESSINGS) ×2 IMPLANT
GAUZE SPONGE 4X4 16PLY XRAY LF (GAUZE/BANDAGES/DRESSINGS) IMPLANT
GLOVE BIOGEL PI IND STRL 7.5 (GLOVE) ×1 IMPLANT
GLOVE BIOGEL PI IND STRL 8 (GLOVE) ×1 IMPLANT
GLOVE BIOGEL PI INDICATOR 7.5 (GLOVE) ×1
GLOVE BIOGEL PI INDICATOR 8 (GLOVE) ×1
GLOVE SKINSENSE NS SZ7.5 (GLOVE) ×1
GLOVE SKINSENSE STRL SZ7.5 (GLOVE) ×1 IMPLANT
GLOVE SURG SIGNA 7.5 PF LTX (GLOVE) IMPLANT
GOWN PREVENTION PLUS XLARGE (GOWN DISPOSABLE) ×4 IMPLANT
KIT MARKER MARGIN INK (KITS) ×2 IMPLANT
NEEDLE HYPO 25X1 1.5 SAFETY (NEEDLE) ×2 IMPLANT
NS IRRIG 1000ML POUR BTL (IV SOLUTION) IMPLANT
PACK BASIN DAY SURGERY FS (CUSTOM PROCEDURE TRAY) ×2 IMPLANT
PENCIL BUTTON HOLSTER BLD 10FT (ELECTRODE) ×2 IMPLANT
SLEEVE SCD COMPRESS KNEE MED (MISCELLANEOUS) ×2 IMPLANT
SPONGE LAP 18X18 X RAY DECT (DISPOSABLE) ×2 IMPLANT
STRIP CLOSURE SKIN 1/4X4 (GAUZE/BANDAGES/DRESSINGS) IMPLANT
SUT MON AB 5-0 PS2 18 (SUTURE) ×2 IMPLANT
SUT VICRYL 3-0 CR8 SH (SUTURE) ×2 IMPLANT
SYR CONTROL 10ML LL (SYRINGE) ×2 IMPLANT
TOWEL OR NON WOVEN STRL DISP B (DISPOSABLE) ×2 IMPLANT
TUBE CONNECTING 20X1/4 (TUBING) ×2 IMPLANT
WATER STERILE IRR 1000ML POUR (IV SOLUTION) ×2 IMPLANT
YANKAUER SUCT BULB TIP NO VENT (SUCTIONS) ×2 IMPLANT

## 2012-10-11 NOTE — Interval H&P Note (Signed)
History and Physical Interval Note:  10/11/2012 11:00 AM  [name]  has presented today for surgery, with the diagnosis of Right breast atypical ductal hyperplasia  The various methods of treatment have been discussed with the patient and family. Husband at bedside.  After consideration of risks, benefits and other options for treatment, the patient has consented to  Procedure(s) (LRB) with comments: BREAST LUMPECTOMY WITH NEEDLE LOCALIZATION (Right) - Right breast lumpectomy, wire localization ( MRI guided) as a surgical intervention .    The patient's history has been reviewed, patient examined, no change in status, stable for surgery.  I have reviewed the patient's chart and labs.  Questions were answered to the patient's satisfaction.     Brailee Riede H

## 2012-10-11 NOTE — Anesthesia Preprocedure Evaluation (Addendum)
Anesthesia Evaluation  Patient identified by MRN, date of birth, ID band Patient awake    Reviewed: Allergy & Precautions, H&P , NPO status , Patient's Chart, lab work & pertinent test results, reviewed documented beta blocker date and time   History of Anesthesia Complications Negative for: history of anesthetic complications  Airway Mallampati: I TM Distance: >3 FB Neck ROM: Full    Dental  (+) Teeth Intact and Dental Advisory Given   Pulmonary asthma , COPD COPD inhaler, Current Smoker,  breath sounds clear to auscultation  Pulmonary exam normal       Cardiovascular hypertension, Pt. on medications and Pt. 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 (from GERD?))  Medicated and Poorly Controlled,  Endo/Other  negative endocrine ROS  Renal/GU negative Renal ROS     Musculoskeletal   Abdominal   Peds  Hematology   Anesthesia Other Findings   Reproductive/Obstetrics                         Anesthesia Physical Anesthesia Plan  ASA: III  Anesthesia Plan: General   Post-op Pain Management:    Induction: Intravenous  Airway Management Planned: Oral ETT and Video Laryngoscope Planned  Additional Equipment:   Intra-op Plan:   Post-operative Plan:   Informed Consent: I have reviewed the patients History and Physical, chart, labs and discussed the procedure including the risks, benefits and alternatives for the proposed anesthesia with the patient or authorized representative who has indicated his/her understanding and acceptance.   Dental advisory given  Plan Discussed with: CRNA and Surgeon  Anesthesia Plan  Comments: (Plan routine monitors, GETA with VideoGlide intubation  )        Anesthesia Quick Evaluation

## 2012-10-11 NOTE — Transfer of Care (Signed)
Immediate Anesthesia Transfer of Care Note  Patient: [name]  Procedure(s) Performed: Procedure(s) (LRB) with comments: BREAST LUMPECTOMY WITH NEEDLE LOCALIZATION (Right) - Right breast lumpectomy, wire localization ( MRI guided)  Patient Location: PACU  Anesthesia Type: General  Level of Consciousness: awake, alert  and oriented  Airway & Oxygen Therapy: Patient Spontanous Breathing and Patient connected to face mask oxygen  Post-op Assessment: Report given to PACU RN and Post -op Vital signs reviewed and stable  Post vital signs: Reviewed and stable  Complications: No apparent anesthesia complications

## 2012-10-11 NOTE — Op Note (Signed)
[name], [name]                ACCOUNT NO.:  [id number]  MEDICAL RECORD NO.:  [id number]  LOCATION:  [facility]C                          FACILITY:  [facility]  PHYSICIAN:  Sandria Bales. Ezzard Standing, M.D.  DATE OF BIRTH:  Dec 16, 1946  DATE OF PROCEDURE:  08/06/2012                              OPERATIVE REPORT  PREOPERATIVE DIAGNOSIS:  Atypical ductal hyperplasia, 6 o'clock position, right breast.  POSTOPERATIVE DIAGNOSES:  Atypical ductal hyperplasia, 6 o'clock position, right breast.  Final pathology pending.  PROCEDURE:  Right breast wire lumpectomy.  SURGEON:  Sandria Bales. Ezzard Standing, M.D.  FIRST ASSISTANT:  None.  ANESTHESIA:  General endotracheal, supervised by Dr. Jairo Ben. Local anesthesia is 30 mL of 0.25% Marcaine.  COMPLICATIONS:  None.  INDICATION FOR PROCEDURE:  [name] is a 66 year old white female who had a mammogram that showed some microcalcifications in the 6 o'clock position of her right breast.  She underwent a core biopsy in August 23, 2012, that showed atypical ductal hyperplasia.  She developed significant bruising of her right breast.  I wanted to wait for some of the bruising to resolve before going ahead with the surgery.  She has 3 clips in the left breast.  Because the abnormal area was seen better with MRI than either a mammogram or ultrasound, she underwent a MRI directed wire localization today by Dr. Britta Mccreedy with a mammogram picture post wire placement.  Note she had three markers in her breast, but these were medial to the suspicious area seen on MRI.  In talking to Dr. Mayford Knife, the thick portion of the localization wire and not the markers are the target tissue.  The indications and potential complications of surgery explained to the patient.  Potential complications include, but are not limited to, bleeding, infection, need for further surgery, and nerve injury.  She also has had significant bruising, so probably has some risk with this also.  OPERATIVE NOTE:   The patient taken to room #8 after wire localization using the MRI by Dr. Britta Mccreedy.  The 3 clips in her right breast are not in the area that Dr. Mayford Knife was worried about, but the thickened part of the localization wire was target (phone conversation with Dr. Norris Cross.  She underwent general anesthesia by Dr. Jairo Ben.  She is allergic to penicillin, so I tried to give her Cipro, but she had a reaction to this involving pain in her arm.  She had no antibiotics before surgery. Her right breast was prepped with ChloraPrep and sterilely draped.  A time-out was held.  Surgical checklist run.  The wire came out lateral to medial and the wire entered the skin at the 8 o'clock position, directed to the inferior edge of the areola.  I made a 4-cm incision in the lower part of the breast and excised a block of breast tissue approximately 6 cm x 4 cm.  While doing the excision, I found 1 of the markers in the breast.   I got the wire out entirely and took my dissection down to the chest wall.  I painted the specimen with the 6-color paint kit, and then did a specimen mammogram, which confirmed the wire  in the middle of the specimen at least 1 clip in the specimen.  It was really was not associated with the abnormal area, and this was sent to Pathology for permanent pathology.  There is also a moderate sized cyst with a bunch of debris in it at the superior margin of the breast lumpectomy.  I took our the lower half of the wall, formed the superior margin of the breast cavity.   So I  did a further margin, labeled "superior margin", to get the whole cyst.   I labeled this is a superior margin of right breast biopsy.  I then irrigated the wound with about 500 mL of saline.  I infiltrated 30 mL of 0.25% Marcaine plain.  I then closed in layers with 3-0 Vicryl suture, the skin with a 5-0 Monocryl suture and painted the wound with Dermabond and sterilely then dressed with 4x4s and a breast binder.  Sponge  and needle count were correct at the end of the case.  She tolerated the procedure well.  Pathology is pending at the time of this dictation.   Sandria Bales. Ezzard Standing, M.D., FACS   DHN/MEDQ  D:  10/11/2012  T:  10/11/2012  Job:  [id number]  cc:   Oliver Hum, MD

## 2012-10-11 NOTE — H&P (View-Only) (Signed)
 Re:   [name] DOB:   12/14/1946 MRN:   [id number]  ASSESSMENT AND PLAN: 1.  Nipple discharge, right  Atypical ductal hyperplasia with Ca++ on core biopsy on 08/23/2012, this is apparently in the inferior aspect of the right breast.   The area of atypical ductal hyperplasia should be excised.  This was not seen on a mammogram or US, but on a MRI of the breast.  I discussed lumpectomy with wire localization with the patient. The risks of surgery include, but are not limited to, bleeding, infection, the need for further surgery, and nerve injury.  She already knows what significant bruising is like.  The area with the ADH is a 4 to 5 cm area in inferior (6 o'clock) right breast that sort of follows a duct.  Would be best bracketed using MRI. Discussed with Dr. J. Hu.    I delayed her biopsy because of significant breast bruising that she had with her core needle biopsy.  She is scheduled for 10/11/2012.  1a.  To stop Premarin until we have a final diagnosis.   2.  HTN. 3.  GERD   Saw Dr. Kaplan, but she now manages this herself 4.  Depression 5.  Tachyarrhythmia, controlled on Atenolol  Saw Dr. C. McAlhaney 6.  CAD 7.  COPD/Asthma  Hospitalized in June 2013 for acute bronchitis, now released by Dr. Byrum 8.  Fibromyalgia  Followed by Dr. Miller in [place] 9.  Multiple benign skin lesions - Dr. F. Lupton 10.  Quit smoking in June 2013.  Chief Complaint  Patient presents with  . Follow-up    rt breast   REFERRING PHYSICIAN: Yvonne Lowne, DO  HISTORY OF PRESENT ILLNESS: [name] is a 66 y.o. (DOB: 03/20/1946)  white female whose primary care physician is Yvonne Lowne, DO and comes to me today for a pre op check of the right breast prior to MRI guided lumpectomy.  She came mainly to recheck her breast. She still has some pain and discomfort, but is doing much better than when I last saw her. I answered questions about the surgery, about the pathology, about the incision,  and about potential bruising. She will stop her aspirin one week prior to surgery.  She is going on a woman's retreat one week after surgery and had questions about that.  Breast History: She noticed a right nipple discharge which started last October, 2012.  She has had biopsies of both breast (3 on the right and 1 on the left) in the past.  She has 2 paternal aunts who had breast cancer (in their 70's), but no one on her maternal side has had breast cancer.  She had a hysterectomy and bilateral oophorectomy for endometriosis in 1981.  Then in 1991, she had cervical cancer treated by a Dr. Sally in [place]  She has had no further problems.  She is on Premarin, which she has been on for years.  We have agreed to stop this until all the biopsies are in.  Mammogram - 07/24/2012 - [facility] - negative, dense breast Mammogram and US of breast - 06/12/2012 - [facility] - negative MRI breast - 07/29/2012 - two areas of concern in right breast, 7 o'clock and 9 o'clock  Due to the findings on the MRI, she had 3 biopsies of her right breast by Dr. S. Turner on 08/23/2012.  Two biopsies proved benign, but the biopsy labeled "breast, right, (2B)" showed atypical ductal hyperplasia.    I plan to excise that area of the breast.   Past Medical History  Diagnosis Date  . GERD (gastroesophageal reflux disease)   . Arthritis   . Asthma   . Hypertension   . Migraine   . History of colonic polyps   . Hypothyroidism   . Bronchitis     chronic  . Irregular heartbeat   . CAD (coronary artery disease)     mild non-obstructive cad  . Skin cancer   . Cervical cancer 1991    Tx  F5U    Past Surgical History  Procedure Date  . Appendectomy   . Tonsillectomy   . Skin cancer removal   . Vocal cord cyst removal   . Nasal sinus surgery   . Right ankle   . Right hand surgery   . Abdominal hysterectomy   . Breast biopsy     multiple --all neg     Current Outpatient Prescriptions  Medication Sig  Dispense Refill  . albuterol (PROVENTIL HFA;VENTOLIN HFA) 108 (90 BASE) MCG/ACT inhaler Inhale 2 puffs into the lungs every 4 (four) hours as needed.      . aspirin 81 MG tablet Take 81 mg by mouth daily.        . atenolol (TENORMIN) 50 MG tablet Take 50 mg by mouth daily. 1/2 tablet daily      . B Complex Vitamins (B-COMPLEX/B-12 PO) Take 1 tablet by mouth daily.        . Calcium Carb-Cholecalciferol (CALCIUM 1000 + D PO) Take 2 tablets by mouth daily.        . Cholecalciferol (VITAMIN D) 1000 UNITS capsule Take 1,000 Units by mouth daily.        . Docusate Calcium (STOOL SOFTENER PO) Take by mouth. As needed       . DULoxetine (CYMBALTA) 60 MG capsule Take 1 capsule (60 mg total) by mouth 2 (two) times daily.  180 capsule  3  . esomeprazole (NEXIUM) 40 MG capsule Take 1 capsule 30 min before breakfast. Lot# [id number] Exp date: 03/2014  30 capsule  0  . furosemide (LASIX) 20 MG tablet As needed      . ibuprofen (ADVIL,MOTRIN) 200 MG tablet Take 800 mg by mouth every 6 (six) hours as needed. For pain      . imipramine (TOFRANIL) 25 MG tablet Take 1 tablet (25 mg total) by mouth at bedtime.  90 tablet  0  . mometasone (NASONEX) 50 MCG/ACT nasal spray Place 2 sprays into the nose daily as needed.       . Multiple Vitamin (MULTIVITAMIN) tablet Take 1 tablet by mouth daily.       . polyethylene glycol (MIRALAX / GLYCOLAX) packet Take 17 g by mouth daily.       . Probiotic Product (PA PROBIOTIC COMPLEX) TABS Take 1 tablet by mouth daily.        . rosuvastatin (CRESTOR) 10 MG tablet Take 1 tablet (10 mg total) by mouth daily.  90 tablet  3  . traMADol (ULTRAM) 50 MG tablet 1-2 po every 6 hours prn  60 tablet  0  . traZODone (DESYREL) 100 MG tablet Take 1 tablet (100 mg total) by mouth at bedtime.  90 tablet  0      Allergies  Allergen Reactions  . Clindamycin Hcl Shortness Of Breath and Rash  . Penicillins Anaphylaxis  . Prednisone Shortness Of Breath  . Codeine Hives, Itching and Other (See  Comments)    headache  .   Latex Hives  . Pentazocine Lactate   . Pneumococcal Vaccine Polyvalent     REACTION: redness, swelling, and hives at injection site    REVIEW OF SYSTEMS: Skin:  Just had a bunch of skin lesions burned by Dr. Lupton. Infection:  No history of hepatitis or HIV.  No history of MRSA. Neurologic:  No history of stroke.  No history of seizure.  No history of headaches. Cardiac:  HTN.  Had irregular heart rate, but controlled on Atenolol.  Had heart cath in 2011.  Followed by Dr. McAlhaney. Pulmonary:  Hospitalized for bronchitis at [facility] in June 2013.  Quit smoking in June 2013. Endocrine:  No diabetes. No thyroid disease. Gastrointestinal:  GERD.  Had colonoscopy in 2011.  Urologic:  No history of kidney stones.  No history of bladder infections. Musculoskeletal:  Fibromyalgia, followed by Dr. Jos. Miller, neurology in [place]. Hematologic:  No bleeding disorder.  No history of anemia.  Not anticoagulated. Psycho-social:  The patient is oriented.   The patient has no obvious psychologic or social impairment to understanding our conversation and plan.  SOCIAL and FAMILY HISTORY: Married. Husband with patient.  I took a cyst off her husband's back. She has 3 children:  48 female, 42 and 35 female  PHYSICAL EXAM: BP 112/74  Pulse 72  Temp 98.3 F (36.8 C)  Resp 18  Ht 5' 5" (1.651 m)  Wt 115 lb 9.6 oz (52.436 kg)  BMI 19.24 kg/m2  General: WN WF who is alert and generally healthy appearing.  Breasts:  Right - [place] is much better, but it is still lumpy and bruised.  I think though, she is ready to go ahead with surgery.  Left - No mass  DATA REVIEWED: No new report.  Dimarco Minkin, MD,  FACS [facility] [address]  [address]   [place], [place]    [address] Phone:  [phone] FAX:  [phone]  

## 2012-10-11 NOTE — Anesthesia Procedure Notes (Signed)
Procedure Name: Intubation Date/Time: 10/11/2012 11:42 AM Performed by: Verlan Friends Pre-anesthesia Checklist: Patient identified, Emergency Drugs available, Suction available, Patient being monitored and Timeout performed Patient Re-evaluated:Patient Re-evaluated prior to inductionOxygen Delivery Method: Circle System Utilized Preoxygenation: Pre-oxygenation with 100% oxygen Intubation Type: IV induction Ventilation: Mask ventilation without difficulty Grade View: Grade I Tube type: Oral Tube size: 6.5 mm Number of attempts: 1 Airway Equipment and Method: stylet and oral airway (Glide scope used) Placement Confirmation: ETT inserted through vocal cords under direct vision,  positive ETCO2 and breath sounds checked- equal and bilateral Secured at: 20 cm Tube secured with: Tape Dental Injury: Teeth and Oropharynx as per pre-operative assessment  Comments: Intubation by Dr. Jean Rosenthal, Excellent view with glide scope.  Lesion observed and untouched below arytenoids.  Cords clear and open.

## 2012-10-11 NOTE — Anesthesia Postprocedure Evaluation (Signed)
Anesthesia Post Note  Patient: [name]  Procedure(s) Performed: Procedure(s) (LRB): BREAST LUMPECTOMY WITH NEEDLE LOCALIZATION (Right)  Anesthesia type: General  Patient location: PACU  Post pain: Pain level controlled and Adequate analgesia  Post assessment: Post-op Vital signs reviewed, Patient's Cardiovascular Status Stable, Respiratory Function Stable, Patent Airway and Pain level controlled  Last Vitals:  Filed Vitals:   10/11/12 1330  BP: 112/98  Pulse:   Temp:   Resp:     Post vital signs: Reviewed and stable  Level of consciousness: awake, alert  and oriented  Complications: No apparent anesthesia complications

## 2012-10-17 ENCOUNTER — Encounter (INDEPENDENT_AMBULATORY_CARE_PROVIDER_SITE_OTHER): Payer: Self-pay | Admitting: Surgery

## 2012-10-17 ENCOUNTER — Ambulatory Visit (INDEPENDENT_AMBULATORY_CARE_PROVIDER_SITE_OTHER): Payer: Medicare Other | Admitting: Surgery

## 2012-10-17 VITALS — BP 124/76 | HR 73 | Temp 97.1°F | Wt 116.5 lb

## 2012-10-17 DIAGNOSIS — C50919 Malignant neoplasm of unspecified site of unspecified female breast: Secondary | ICD-10-CM

## 2012-10-17 DIAGNOSIS — C50911 Malignant neoplasm of unspecified site of right female breast: Secondary | ICD-10-CM

## 2012-10-17 NOTE — Progress Notes (Signed)
Re:   [name] DOB:   1946/02/08 MRN:   [id number]  ASSESSMENT AND PLAN: 1.  Right breast cancer, 6 o'clock  DCIS (3.4 cm) on excisional biopsy 10/11/2012.  Anterior margin 0.15 cm.  ER/PR pending  Will get med onc (she has requested Dr. Darnelle Catalan) and rad onc consultation.  She will see me back in 6 months.  2.  HTN. 3.  GERD   Saw Dr. Arlyce Dice, but she now manages this herself 4.  Depression 5.  Tachyarrhythmia, controlled on Atenolol  Saw Dr. Olene Floss 6.  CAD 7.  COPD/Asthma  Hospitalized in June 2013 for acute bronchitis, now released by Dr. Delton Coombes 8.  Fibromyalgia  Followed by Dr. Hyacinth Meeker in [facility] 9.  Multiple benign skin lesions - Dr. Alinda Sierras 10.  Quit smoking in June 2013. 11. Stopped Premarin - having hot flashes.  Chief Complaint  Patient presents with  . Routine Post Op   REFERRING PHYSICIAN: Loreen Freud, DO  HISTORY OF PRESENT ILLNESS: [name] is a 66 y.o. (DOB: 09/08/1946)  white female whose primary care physician is Loreen Freud, DO and comes to me for follow up of a right breast biopsy.  This showed DCIS.  I gave the patient a copy of her path report.  Her husband is with her.  She will need med onc (she has requested Dr. Darnelle Catalan) and rad onc consultation.  Breast History: She noticed a right nipple discharge which started last October, 2012.  She has had biopsies of both breast (3 on the right and 1 on the left) in the past.  She has 2 paternal aunts who had breast cancer (in their 53's), but no one on her maternal side has had breast cancer.  She had a hysterectomy and bilateral oophorectomy for endometriosis in 1981.  Then in 1991, she had cervical cancer treated by a Dr. Kennon Rounds in [place].  She has had no further problems.  She is on Premarin, which she has been on for years.  We have agreed to stop this until all the biopsies are in.  Mammogram - 07/24/2012 - [facility] - negative, dense breast Mammogram and [place] of breast - 06/12/2012 -  [facility] - negative MRI breast - 07/29/2012 - two areas of concern in right breast, 7 o'clock and 9 o'clock  Due to the findings on the MRI, she had 3 biopsies of her right breast by Dr. Norris Cross on 08/23/2012.  Two biopsies proved benign, but the biopsy labeled "breast, right, (2B)" showed atypical ductal hyperplasia.  I plan to excise that area of the breast.     Past Surgical History  Procedure Date  . Appendectomy   . Tonsillectomy   . Skin cancer removal   . Vocal cord cyst removal   . Nasal sinus surgery   . Right ankle   . Right hand surgery   . Abdominal hysterectomy   . Breast biopsy     multiple --all neg     Current Outpatient Prescriptions  Medication Sig Dispense Refill  . albuterol (PROVENTIL HFA;VENTOLIN HFA) 108 (90 BASE) MCG/ACT inhaler Inhale 2 puffs into the lungs every 4 (four) hours as needed.      Marland Kitchen amLODipine (NORVASC) 2.5 MG tablet Take 2.5 mg by mouth daily.      Marland Kitchen aspirin 81 MG tablet Take 81 mg by mouth daily.        Marland Kitchen atenolol (TENORMIN) 50 MG tablet Take 50 mg by mouth daily.  1/2 tablet daily      . B Complex Vitamins (B-COMPLEX/B-12 PO) Take 1 tablet by mouth daily.        . Calcium Carb-Cholecalciferol (CALCIUM 1000 + D PO) Take 2 tablets by mouth daily.        . Cholecalciferol (VITAMIN D) 1000 UNITS capsule Take 1,000 Units by mouth daily.        Tery Sanfilippo Calcium (STOOL SOFTENER PO) Take by mouth. As needed       . DULoxetine (CYMBALTA) 60 MG capsule Take 1 capsule (60 mg total) by mouth 2 (two) times daily.  180 capsule  3  . esomeprazole (NEXIUM) 40 MG capsule Take 1 capsule 30 min before breakfast. Lot# [id number] Exp date: 03/2014  30 capsule  0  . furosemide (LASIX) 20 MG tablet As needed      . ibuprofen (ADVIL,MOTRIN) 200 MG tablet Take 800 mg by mouth every 6 (six) hours as needed. For pain      . imipramine (TOFRANIL) 25 MG tablet Take 1 tablet (25 mg total) by mouth at bedtime.  90 tablet  0  . mometasone (NASONEX) 50 MCG/ACT nasal  spray Place 2 sprays into the nose daily as needed.       . Multiple Vitamin (MULTIVITAMIN) tablet Take 1 tablet by mouth daily.       . polyethylene glycol (MIRALAX / GLYCOLAX) packet Take 17 g by mouth daily.       . Probiotic Product (PA PROBIOTIC COMPLEX) TABS Take 1 tablet by mouth daily.        . rosuvastatin (CRESTOR) 10 MG tablet Take 1 tablet (10 mg total) by mouth daily.  90 tablet  3  . traMADol (ULTRAM) 50 MG tablet 1-2 po every 6 hours prn  60 tablet  0  . traMADol (ULTRAM) 50 MG tablet Take 1-2 tablets (50-100 mg total) by mouth every 6 (six) hours as needed for pain.  30 tablet  1  . traZODone (DESYREL) 100 MG tablet Take 1 tablet (100 mg total) by mouth at bedtime.  90 tablet  0      Allergies  Allergen Reactions  . Clindamycin Hcl Shortness Of Breath and Rash  . Penicillins Anaphylaxis  . Prednisone Shortness Of Breath  . Codeine Hives, Itching and Other (See Comments)    headache  . Latex Hives  . Pentazocine Lactate   . Pneumococcal Vaccine Polyvalent     REACTION: redness, swelling, and hives at injection site    REVIEW OF SYSTEMS: Skin:  Just had a bunch of skin lesions burned by Dr. Terri Piedra. Infection:  No history of hepatitis or HIV.  No history of MRSA. Neurologic:  No history of stroke.  No history of seizure.  No history of headaches. Cardiac:  HTN.  Had irregular heart rate, but controlled on Atenolol.  Had heart cath in 2011.  Followed by Dr. Sanjuana Kava. Pulmonary:  Hospitalized for bronchitis at [facility] in June 2013.  Quit smoking in June 2013. Endocrine:  No diabetes. No thyroid disease. Gastrointestinal:  GERD.  Had colonoscopy in 2011.  Urologic:  No history of kidney stones.  No history of bladder infections. Musculoskeletal:  Fibromyalgia, followed by Dr. Adline Peals. Hyacinth Meeker, neurology in [facility]. Hematologic:  No bleeding disorder.  No history of anemia.  Not anticoagulated. Psycho-social:  The patient is oriented.   The patient has no obvious psychologic or  social impairment to understanding our conversation and plan.  SOCIAL and FAMILY HISTORY: Married. Husband with patient.  I took a cyst off her husband's back in the past. She has 3 children:  33 female, 35 and 85 female  PHYSICAL EXAM: BP 124/76  Pulse 73  Temp 97.1 F (36.2 C) (Oral)  Wt 116 lb 8 oz (52.844 kg)  General: WN WF who is alert and generally healthy appearing.  Breasts:  Right - Incision at 6 o'clock looks good.  She has some sensitivity at the biopsy site.  Left - No mass  DATA REVIEWED: No new report.  Ovidio Kin, MD,  [facility] [address]  [address]   [place], [place]    [address] Phone:  [phone] FAX:  [phone]

## 2012-10-21 ENCOUNTER — Encounter: Payer: Self-pay | Admitting: Radiation Oncology

## 2012-10-22 ENCOUNTER — Ambulatory Visit
Admission: RE | Admit: 2012-10-22 | Discharge: 2012-10-22 | Disposition: A | Discharge status: Home / Self Care | Payer: Medicare Other | Source: Ambulatory Visit | Attending: Radiation Oncology | Admitting: Radiation Oncology

## 2012-10-22 ENCOUNTER — Encounter: Payer: Self-pay | Admitting: Radiation Oncology

## 2012-10-22 ENCOUNTER — Ambulatory Visit: Payer: Medicare Other

## 2012-10-22 VITALS — BP 136/79 | HR 72 | Temp 97.6°F | Wt 118.5 lb

## 2012-10-22 DIAGNOSIS — K219 Gastro-esophageal reflux disease without esophagitis: Secondary | ICD-10-CM | POA: Insufficient documentation

## 2012-10-22 DIAGNOSIS — Z170 Estrogen receptor positive status [ER+]: Secondary | ICD-10-CM | POA: Insufficient documentation

## 2012-10-22 DIAGNOSIS — Z79899 Other long term (current) drug therapy: Secondary | ICD-10-CM | POA: Insufficient documentation

## 2012-10-22 DIAGNOSIS — C50519 Malignant neoplasm of lower-outer quadrant of unspecified female breast: Secondary | ICD-10-CM | POA: Insufficient documentation

## 2012-10-22 DIAGNOSIS — I10 Essential (primary) hypertension: Secondary | ICD-10-CM | POA: Insufficient documentation

## 2012-10-22 DIAGNOSIS — Z90710 Acquired absence of both cervix and uterus: Secondary | ICD-10-CM | POA: Insufficient documentation

## 2012-10-22 DIAGNOSIS — Z87891 Personal history of nicotine dependence: Secondary | ICD-10-CM | POA: Insufficient documentation

## 2012-10-22 DIAGNOSIS — E039 Hypothyroidism, unspecified: Secondary | ICD-10-CM | POA: Insufficient documentation

## 2012-10-22 DIAGNOSIS — J45909 Unspecified asthma, uncomplicated: Secondary | ICD-10-CM | POA: Insufficient documentation

## 2012-10-22 DIAGNOSIS — IMO0001 Reserved for inherently not codable concepts without codable children: Secondary | ICD-10-CM | POA: Insufficient documentation

## 2012-10-22 DIAGNOSIS — C50911 Malignant neoplasm of unspecified site of right female breast: Secondary | ICD-10-CM

## 2012-10-22 DIAGNOSIS — Z8541 Personal history of malignant neoplasm of cervix uteri: Secondary | ICD-10-CM | POA: Insufficient documentation

## 2012-10-22 DIAGNOSIS — I2510 Atherosclerotic heart disease of native coronary artery without angina pectoris: Secondary | ICD-10-CM | POA: Insufficient documentation

## 2012-10-22 HISTORY — DX: Fibromyalgia: M79.7

## 2012-10-22 NOTE — Progress Notes (Signed)
[name] visit today.   States she is stressed but has a strong faith.   Reports that she has an intermittent sharp, achy pain in her [place] region.

## 2012-10-22 NOTE — Progress Notes (Signed)
Radiation Oncology         [phone] ________________________________  Initial outpatient Consultation  Name: [name] MRN: [id number]  Date: 10/22/2012  DOB: [birth year]  CC:Sheri Freud, DO  Kandis Cocking, MD   REFERRING PHYSICIAN: Kandis Cocking, MD  DIAGNOSIS: The encounter diagnosis was Carcinoma of lower outer quadrant of breast. [id number]   HISTORY OF PRESENT ILLNESS::[name] is a 66 y.o. female  with right breast DCIS. She has had 5 biopsies in the past of her breasts bilaterally in the past and these have all been benign.  The patient also has history of cervical cancer that was treated with conization and 5-FU. She had hysterectomy in 1981 for endometriosis.  I have reviewed the patient's past imaging. She has undergone multiple sets of imaging in her workup, as described below. MRI has been conducted. Lumpectomy has also been performed, on 10/11/2012. The pathology report reveals low-grade DCIS which is ER/PR positive. Margins are negative by 0.15 cm. The DCIS was 3.2 cm in size.  The patient reports numerous stressors at home, family related. She is anxious, but optimistic. 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 Upcoming appointment this week.   PREVIOUS RADIATION THERAPY: No  PAST MEDICAL HISTORY:  has a past medical history of GERD (gastroesophageal reflux disease); Arthritis; Asthma; Hypertension; Migraine; History of colonic polyps; Hypothyroidism; Bronchitis; Irregular heartbeat; CAD (coronary artery disease); Skin cancer; Cervical cancer (1991); Cancer of right breast (10/11/12); and Fibromyalgia.    PAST SURGICAL HISTORY: Past Surgical History  Procedure Date  . Appendectomy   . Tonsillectomy   . Skin cancer removal   . Vocal cord cyst removal   . Nasal sinus surgery   . Right ankle   . Right hand surgery   . Abdominal hysterectomy   . Breast biopsy     multiple --all neg  .  Lumpectomy right breast 10/11/12    FAMILY HISTORY: family history includes Arthritis in her other; Cancer in her maternal aunt, mother, and other; Coronary artery disease (age of onset:65) in her other; Diabetes in her other; Heart disease in her other; Hyperlipidemia in her other; Hypertension in her other; Irritable bowel syndrome in her daughter and son; Osteoporosis in her other; and Rectal cancer in her mother.She has a history of paternal aunt breast cancer, both diagnosed in their 59s. Her mother had rectal cancer. Her mother is now 34 year old and doing fine.  SOCIAL HISTORY:  reports that she quit smoking about 4 months ago. Her smoking use included Cigarettes. She smoked .2 packs per day for 0 years. She has never used smokeless tobacco. She reports that she drinks alcohol. She reports that she does not use illicit drugs.  ALLERGIES: Clindamycin hcl; Penicillins; Prednisone; Codeine; Latex; Pentazocine lactate; and Pneumococcal vaccine polyvalent  MEDICATIONS:  Current Outpatient Prescriptions  Medication Sig Dispense Refill  . albuterol (PROVENTIL HFA;VENTOLIN HFA) 108 (90 BASE) MCG/ACT inhaler Inhale 2 puffs into the lungs every 4 (four) hours as needed.      Marland Kitchen amLODipine (NORVASC) 2.5 MG tablet Take 2.5 mg by mouth daily.      Marland Kitchen aspirin 81 MG tablet Take 81 mg by mouth daily.        Marland Kitchen atenolol (TENORMIN) 50 MG tablet Take 50 mg by mouth daily. 1/2 tablet daily      . B Complex Vitamins (B-COMPLEX/B-12 PO) Take 1 tablet by mouth daily.        Marland Kitchen  Calcium Carb-Cholecalciferol (CALCIUM 1000 + D PO) Take 2 tablets by mouth daily.        . Cholecalciferol (VITAMIN D) 1000 UNITS capsule Take 1,000 Units by mouth daily.        Tery Sanfilippo Calcium (STOOL SOFTENER PO) Take by mouth. As needed       . DULoxetine (CYMBALTA) 60 MG capsule Take 1 capsule (60 mg total) by mouth 2 (two) times daily.  180 capsule  3  . esomeprazole (NEXIUM) 40 MG capsule Take 1 capsule 30 min before breakfast. Lot#  [id number] Exp date: 03/2014  30 capsule  0  . furosemide (LASIX) 20 MG tablet As needed      . ibuprofen (ADVIL,MOTRIN) 200 MG tablet Take 800 mg by mouth every 6 (six) hours as needed. For pain      . imipramine (TOFRANIL) 25 MG tablet Take 1 tablet (25 mg total) by mouth at bedtime.  90 tablet  0  . mometasone (NASONEX) 50 MCG/ACT nasal spray Place 2 sprays into the nose daily as needed.       . Multiple Vitamin (MULTIVITAMIN) tablet Take 1 tablet by mouth daily.       . polyethylene glycol (MIRALAX / GLYCOLAX) packet Take 17 g by mouth daily.       . Probiotic Product (PA PROBIOTIC COMPLEX) TABS Take 1 tablet by mouth daily.        . traMADol (ULTRAM) 50 MG tablet 1-2 po every 6 hours prn  60 tablet  0  . traMADol (ULTRAM) 50 MG tablet Take 1-2 tablets (50-100 mg total) by mouth every 6 (six) hours as needed for pain.  30 tablet  1  . traZODone (DESYREL) 100 MG tablet Take 1 tablet (100 mg total) by mouth at bedtime.  90 tablet  0  . rosuvastatin (CRESTOR) 10 MG tablet Take 1 tablet (10 mg total) by mouth daily.  90 tablet  3    REVIEW OF SYSTEMS:   Pertinent items are noted in HPI.   PHYSICAL EXAM:  weight is 118 lb 8 oz (53.751 kg). Her temperature is 97.6 F (36.4 C). Her blood pressure is 136/79 and her pulse is 72.   General: Alert and oriented, in no acute distress HEENT: Head is normocephalic. Pupils are equally round and reactive to light. Extraocular movements are intact. Oropharynx is clear. Neck: Neck is supple, no palpable cervical or supraclavicular lymphadenopathy. Heart: Regular in rate and rhythm with no murmurs, rubs, or gallops. Chest: Clear to auscultation bilaterally, with no rhonchi, wheezes, or rales. Abdomen: Soft, nontender, nondistended, with no rigidity or guarding. Extremities: No cyanosis or edema. Lymphatics: No concerning lymphadenopathy. Skin: No concerning lesions. Musculoskeletal: symmetric strength and muscle tone throughout. Neurologic: Cranial nerves  II through XII are grossly intact. No obvious focalities. Speech is fluent. Coordination is intact. Psychiatric: Judgment and insight are intact. Affect is appropriate. Breasts: No palpable lesions of concern in either breast. Healing well from her right lumpectomy    LABORATORY DATA:  Lab Results  Component Value Date   WBC 6.2 07/22/2012   HGB 12.3 10/11/2012   HCT 37.4 07/22/2012   MCV 94.0 07/22/2012   PLT 239.0 07/22/2012   Lab Results  Component Value Date   NA 139 10/09/2012   K 4.1 10/09/2012   CL 99 10/09/2012   CO2 30 10/09/2012   Lab Results  Component Value Date   ALT 16 07/22/2012   AST 25 07/22/2012   ALKPHOS 73 07/22/2012   BILITOT  0.4 07/22/2012      RADIOGRAPHY: Mr Biopsy/wire Localization  10/11/2012  *RADIOLOGY REPORT*  Clinical Data:  The patient had a diagnosis of atypical ductal hyperplasia following an MRI-guided core needle biopsy of the right breast.  As the position of tube clips placed at the time MRI biopsy were felt to be too medial in position, it was decided to use wire localization of the area of enhancement instead of localizing clips.  MRI GUIDED WIRE LOCALIZATION:  Comparison: Previous exams.  Technique: Multiplanar, multisequence MR images of the right breast were obtained prior to and following the intravenous administration of 10 ml of Mulithance.  I met with the patient, and we discussed the procedure of MRI guided wire localization, including risks, benefits, and alternatives.  Specifically, we discussed be a small chance of minor bleeding.  Informed, written consent was given.  Using sterile technique, 2% Lidocaine, MRI guidance, and a 7 cm wire localization needle and area of clumped enhancement in the central/slightly outer and slightly inferior right breast was localized.  It is noted that the area of enhancement in the inferior right breast on today's study appears less prominent than that on the patient's original breast MRI.  IMPRESSION: MRI  guided wire localization of an area of enhancement in the central/slightly outer right breast.  No apparent complications.  THREE-DIMENSIONAL MR IMAGE RENDERING ON INDEPENDENT WORKSTATION:  Three-dimensional MR images were rendered by post-processing of the original MR data on an independent workstation.  The three- dimensional MR images were interpreted, and findings were reported in the accompanying complete MRI report for this study.   Original Report Authenticated By: Britta Mccreedy, M.D.    Mm Digital Diagnostic Unilat R  10/11/2012  *RADIOLOGY REPORT*  Clinical Data:  Wire localization of an area of enhancement in the inferior right breast was performed today using MRI guidance.  This is prior to patient's excisional biopsy to be performed today by Dr. Ezzard Standing.  She recently had an MRI-guided biopsy showing atypical ductal hyperplasia.  DIGITAL DIAGNOSTIC RIGHT MAMMOGRAM WITHOUT CAD  Comparison:  MRI guided core needle biopsy today and recent prior mammograms.  Findings:  The CC view of the right breast demonstrates placement of a wire using a lateral approach.  The distal tip of the wire terminates in the central right breast.  The thickened portion of the wire is just slightly lateral to the central right breast, in the expected location of the abnormal enhancement on MRI, in this projection.  Three biopsy clips are noted to be medial to the wire. These clips were not used for localization purposes,  as one corresponded to a benign/concordant biopsy and two were felt to be too medial to the area of abnormal clumped enhancement at the time of placement.  IMPRESSION: Wire placement confirmed in the right breast, following MRI-guided wire localization.   Original Report Authenticated By: Britta Mccreedy, M.D.       IMPRESSION/PLAN: Is a very pleasant 66 year old woman with right breast DCIS, ER/PR positive, status post lumpectomy  Her margins are close by 0.15 cm but negative. I do not feel that reexcision  is necessary. I've spoken about the patient with radiology and they do not feel that postoperative mammography is needed  I explained that radiotherapy is in her best interest, and should decrease her risk of a local recurrence by about 50%.   It was a pleasure meeting the patient today. We discussed the risks, benefits, and side effects of radiotherapy. We discussed that radiation would  take approximately 4-6 weeks to complete and that I will give her a little more time to heal after surgery before starting treatment planning. We spoke about acute effects including skin irritation and fatigue as well as much less common late effects including lung irritation. In light of her history of lung disease, I will spare her lungs as much as possible.  We spoke about the latest technology that is used to minimize the risk of late effects for breast cancer patients undergoing radiotherapy. No guarantees of treatment were given. The patient is enthusiastic about proceeding with treatment. I look forward to participating in the patient's care.   I spent 60 minutes minutes face to face with the patient and more than 50% of that time was spent in counseling and/or coordination of care.    __________________________________________   Lonie Peak, MD

## 2012-10-23 ENCOUNTER — Telehealth: Payer: Self-pay | Admitting: *Deleted

## 2012-10-23 DIAGNOSIS — D107 Benign neoplasm of hypopharynx: Secondary | ICD-10-CM | POA: Insufficient documentation

## 2012-10-23 NOTE — Telephone Encounter (Signed)
Left message for pt to return my call so I can schedule a med onc appt w/ Dr. Darnelle Catalan.

## 2012-10-23 NOTE — Telephone Encounter (Signed)
Pt returned my call and I confirmed 11/12/12 appt w/ pt.  Emailed Huntley Dec at [facility] to make aware.  Mailed before letter & packet to pt.  Took paperwork to Med Rec for chart.

## 2012-10-23 NOTE — Addendum Note (Signed)
Encounter addended by: Delynn Flavin, RN on: 10/23/2012 11:37 AM<BR>     Documentation filed: Charges VN

## 2012-10-25 ENCOUNTER — Telehealth: Payer: Self-pay | Admitting: Family Medicine

## 2012-10-25 NOTE — Telephone Encounter (Signed)
Does pt want appointment---I have not seen her in a while.

## 2012-10-25 NOTE — Telephone Encounter (Signed)
Please offer the patient an apt.     KP 

## 2012-10-25 NOTE — Telephone Encounter (Signed)
Please advise... This came in an appointment request:  Appointment Request From: [name]  With Provider: Loreen Freud, DO [-Primary Care Physician-]  Preferred Date Range: From 10/28/2012 To 11/01/2012  Preferred Times: Mon Morning, Tues Morning, Wed Morning, Thur Morning, Fri Morning  Reason for visit: New Problem Visit   Comments: Per Dr. Lonie Peak, Oncologist/Radiologist, Questions: Vitamins- what do I need to be taking now dx of Hypothyroidism- lab reports? dx of possible chronic kidney disease- lab report Dr. Ivin Booty 10/09/12 now off Premarin- having hot flashes espec. at night with profuse sweating and insomnia NEED: samples of Crestor 10 mg, OUT and Cymbalta 60 mg. Also: date of first time I came to re: my breast and HUGE THANK YOU for all you've done and did for me. If you had not pushed for that MRI, I may not have been so lucky with finding it when you did. Can't tell you how much I appreciate you. I may not even have a breast if it had not been for you! Thanks again. See Dr. Basilio Cairo again on 11/04/12 re: treatment. Hope to have some answers by then. Radiation starts on 11/11/12.

## 2012-10-28 ENCOUNTER — Encounter: Payer: Self-pay | Admitting: *Deleted

## 2012-10-28 ENCOUNTER — Other Ambulatory Visit: Payer: Self-pay | Admitting: *Deleted

## 2012-10-28 DIAGNOSIS — D0510 Intraductal carcinoma in situ of unspecified breast: Secondary | ICD-10-CM

## 2012-10-28 NOTE — Telephone Encounter (Signed)
Called pt at 10am pt scheduled for 9:15am 11.7.13

## 2012-10-29 ENCOUNTER — Encounter: Payer: Self-pay | Admitting: Radiation Oncology

## 2012-10-29 NOTE — Progress Notes (Signed)
The patient called today. She was very tearful. She is fearful that the radiation will  hurt her lungs. She has had many acquaintances that have been fairly forceful in telling her not to receive radiotherapy. She is going to see her primary care practitioner, Dr. Laury Axon, this week. She has been a more emotional than usual since stopping hormonal therapy. She will discuss this with her. She feels very overwhelmed. She has concerns about the clinical trial that was offered by [name] (NSABP B. 43).   I had a long conversation with the patient. I told her that when she comes in on November 11, we will get scans of her chest to verify that we will be able to safely shield her lungs. I told her that we can make a decision together regarding radiotherapy after her CT simulation and that I will make sure she is reasonably comfortable with the plan. She intends to make this appointment. I will ask [name] to remove the patient from her list for the clinical trials; the patient is enthusiastic about this plan

## 2012-10-29 NOTE — Progress Notes (Signed)
Thanks for letting me know---I am seeing her on Thursday and will encourage her not to completely r/o RT.  Myrene Buddy

## 2012-10-31 ENCOUNTER — Ambulatory Visit (INDEPENDENT_AMBULATORY_CARE_PROVIDER_SITE_OTHER): Payer: Medicare Other | Admitting: Family Medicine

## 2012-10-31 ENCOUNTER — Encounter: Payer: Self-pay | Admitting: Family Medicine

## 2012-10-31 VITALS — BP 138/82 | HR 80 | Temp 97.9°F | Wt 118.2 lb

## 2012-10-31 DIAGNOSIS — C50911 Malignant neoplasm of unspecified site of right female breast: Secondary | ICD-10-CM

## 2012-10-31 DIAGNOSIS — E079 Disorder of thyroid, unspecified: Secondary | ICD-10-CM

## 2012-10-31 DIAGNOSIS — G4700 Insomnia, unspecified: Secondary | ICD-10-CM

## 2012-10-31 DIAGNOSIS — C50919 Malignant neoplasm of unspecified site of unspecified female breast: Secondary | ICD-10-CM

## 2012-10-31 LAB — T3, FREE: T3, Free: 2.6 pg/mL (ref 2.3–4.2)

## 2012-10-31 LAB — T4, FREE: Free T4: 0.8 ng/dL (ref 0.60–1.60)

## 2012-10-31 MED ORDER — TRAZODONE HCL 100 MG PO TABS: ORAL_TABLET | ORAL | Status: DC

## 2012-10-31 NOTE — Assessment & Plan Note (Signed)
Pt will discuss rt at next ov with onc

## 2012-10-31 NOTE — Patient Instructions (Signed)
Breast Cancer Survivor Follow-Up  Breast cancer begins when cells in the breast divide too rapidly. The extra cells form a lump (tumor). When the cancer is treated, the goal is to get rid of all cancer cells. However, sometimes a few cells survive. These cancer cells can then grow. They become recurrent cancer. This means the cancer comes back after treatment.   Most cases of recurrent breast cancer develop 3 to 5 years after treatment. However, sometimes it comes back just a few months after treatment. Other times, it does not come back until years later. If the cancer comes back in the same area as the first breast cancer, it is called a local recurrence. If the cancer comes back somewhere else in the body, it is called regional recurrence if the site is fairly near the breast or distant recurrence if it is far from the breast. Your caregiver may also use the term metastasize to indicate a cancer that has gone to another part of your body. Treatment is still possible after either kind of recurrence. The cancer can still be controlled.   CAUSES OF RECURRENT CANCER  No one knows exactly why breast cancer starts in the first place. Why the cancer comes back after treatment is also not clear. It is known that certain conditions, called risk factors, can make this more likely. They include:  · Developing breast cancer for the first time before age 60.  · Having breast cancer that involves the lymph nodes. These are small, round pieces of tissue found all over the body. Their job is to help fight infections.  · Having a large tumor. Cancer is more apt to come back if the first tumor was bigger than 2 inches (5 cm).  · Having certain types of breast cancer, such as:  · Inflammatory breast cancer. This rare type grows rapidly and causes the breast to become red and swollen.  · A high-grade tumor. The grade of a tumor indicates how fast it will grow and spread. High-grade tumors grow more quickly than other types.  · HER2  cancer. This refers to the tumor's genetic makeup. Tumors that have this type of gene are more likely to come back after treatment.  · Having close tumor margins. This refers to the space between the tumor and normal, noncancerous cells. If the space is small, the tumor has a greater chance of coming back.  · Having treatment involving a surgery to remove the tumor but not the entire breast (lumpectomy) and no radiation therapy.  CARE AFTER BREAST CANCER  Home Monitoring  Women who have had breast cancer should continue to examine their breasts every month. The goal is to catch the cancer quickly if it comes back. Many women find it helpful to do so on the same day each month and to mark the calendar as a reminder. Let your caregiver know immediately if you have any signs of recurrent breast cancer. Symptoms will vary, depending on where the cancer recurs. The original type of treatment can also make a difference.  Symptoms of local recurrence after a lumpectomy or a recurrence in the opposite breast may include:  · A new lump or thickening in the breast.  · A change in the way the skin looks on the breast (such as a rash, dimpling, or wrinkling).  · Redness or swelling of the breast.  · Changes in the nipple (such as being red, puckered, swollen, or leaking fluid).  Symptoms of a recurrence after a   breast removal surgery (mastectomy) may include:  · A lump or thickening under the skin.  · A thickening around the mastectomy scar.  Symptoms of regional recurrence in the lymph nodes near the breast may include:  · A lump under the arm or above the collarbone.  · Swelling of the arm.  · Pain in the arm, shoulder, or chest.  · Numbness in the hand or arm.  Symptoms of distant recurrence may include:  · A cough that does not go away.  · Trouble breathing or shortness of breath.  · Pain in the bones or the chest. This is pain that lasts or does not respond to rest and medicine.  · Headaches.  · Sudden vision  problems.  · Dizziness.  · Nausea or vomiting.  · Losing weight without trying to.  · Persistent abdominal pain.  · Changes in bowel movements or blood in the stool.  · Yellowing of the skin or eyes (jaundice).  · Blood in the urine or bloody vaginal discharge.  Clinical Monitoring  · It is helpful to keep a schedule of appointments for needed tests and exams. This includes physical exams, breast exams, exams of the lymph nodes, and general exams.  · For the first 3 years after being treated for breast cancer, see your caregiver every 3 to 6 months.  · For years 4 and 5 after breast cancer, see your caregiver every 6 to 12 months.  · After 5 years, see your caregiver at least once a year.  · Regular breast X-rays (mammograms) should continue even if you had a mastectomy.  · A mammogram should be done 1 year after the mammogram that first detected breast cancer.  · A mammogram should be done every 6 to 12 months after that. Follow your caregiver's advice.  · A pelvic exam done by your caregiver checks whether female organs are the normal size and shape. The exam is usually done every year. Ask your caregiver if that schedule is right for you.  · Women taking tamoxifen should report any vaginal bleeding immediately to their caregiver. Tamoxifen is often given to women with a certain type of breast cancer. It has been shown to help prevent recurrence.  · You will need to decide who your primary caregiver will be.  · Most people continue to see their cancer specialist (oncologist) every 3 to 6 months for the first year after cancer treatment.  · At some point, you may want to go back to seeing your family caregiver. You would no longer see your oncologist for regular checkups. Many women do this about 1 year after their first diagnosis of breast cancer.  · You will still need to be seen every so often by your oncologist. Ask how often that should be. Coordinate this with your family or primary caregiver.  · Think about  having genetic counseling. This would provide information on traits that can be passed or inherited from one generation to the next. In some cases, breast cancer runs in families. Tell your caregiver if you:  · Are of Ashkenazi Jewish heritage.  · Have any family member who has had ovarian cancer.  · Have a mother, sister, or daughter who had breast cancer before age 50.  · Have 2 or more close relatives who have had breast cancer. This means a mother, sister, daughter, aunt, or grandmother.  · Had breast cancer in both breasts.  · Have a female relative who has had breast cancer.  ·   Some tests are not recommended for routine screening. Someone recovering from breast cancer does not need to have these tests if there are no problems. The tests have risks, such as radiation exposure, and can be costly. The risks of these tests are thought to be greater than the benefits:  · Blood tests.  · Chest X-rays.  · Bone scans.  · Liver ultrasound.  · Computed tomography (CT scan).  · Positron emission tomography (PET scan).  · Magnetic resonance imaging (MRI scan).  DIAGNOSIS OF RECURRENT CANCER  Recurrent breast cancer may be suspected for various reasons. A mammogram may not look normal. You might feel a lump or have other symptoms. Your caregiver may find something unusual during an exam. To be sure, your caregiver will probably order some tests. The tests are needed because there are symptoms or hints of a problem. They could include:  · Blood tests, including a test to check how well the liver is working. The liver is a common site for a distant cancer recurrence.  · Imaging tests that create pictures of the inside of the body. These tests include:  · Chest X-rays to show if the cancer has come back in the lungs.  · CT scans to create detailed pictures of various areas of the body and help find a distant recurrence.  · MRI scans to find anything unusual in the breast, chest, or lymph nodes.  · Breast ultrasound tests to  examine the breasts.  · Bone scans to create a picture of your whole skeleton and find cancer in bony areas.  · PET scans to create an image of the whole body. PET scans can be used together with CT scans to show more detail.  · Biopsy. A small sample of tissue is taken and checked under a microscope. If cancer cells are found, they may be tested to see if they contain the HER2 gene or the hormones estrogen and progesterone. This will help your caregiver decide how to treat the recurrent cancer.  TREATMENT   How recurrent breast cancer is treated depends on where the new cancer is found. The type of treatment that was used for the first breast cancer makes a difference, too. A combination of treatments may be used. Options include:  · Surgery.  · If the cancer comes back in the breast that was not treated before, you may need a lumpectomy or mastectomy.  · If the cancer comes back in the breast that was treated before, you may need a mastectomy.  · The lymph nodes under the arm may need to be removed.  · Radiation therapy.  · For a local recurrence, radiation may be used if it was not used during the first treatment.  · For a distance recurrence, radiation is sometimes used.  · Chemotherapy.  · This may be used before surgery to treat recurrent breast cancer.  · This may be used to treat recurrent cancer that cannot be treated with surgery.  · This may be used to treat a distant recurrence.  · Hormone therapy.  · Women with the HER2 gene may be given hormone therapy to attack this gene.  Document Released: 08/09/2011 Document Revised: 03/04/2012 Document Reviewed: 08/09/2011  [employer]® Patient Information ©2013 [employer].

## 2012-10-31 NOTE — Progress Notes (Signed)
  Subjective:    Patient ID: [name], female    DOB: 03-09-1946, 66 y.o.   MRN: [id number]  HPI Pt here to discuss RT visit.  She wasn't sure she wanted to do it but then realized she had a great team of physicians that had discussed her treatment plan and came up with the best plan for her.    She plans on discussing RT further with her doctors at her next appointment.   Review of Systems As above    Objective:   Physical Exam  Constitutional: She appears well-developed and well-nourished.  Neck: Normal range of motion. Neck supple.  Cardiovascular: Normal rate and regular rhythm.   Pulmonary/Chest: Effort normal and breath sounds normal.  Psychiatric: She has a normal mood and affect. Her behavior is normal. Judgment and thought content normal.          Assessment & Plan:Pt

## 2012-11-04 ENCOUNTER — Telehealth: Payer: Self-pay | Admitting: Radiation Oncology

## 2012-11-04 ENCOUNTER — Ambulatory Visit
Admission: RE | Admit: 2012-11-04 | Discharge: 2012-11-04 | Disposition: A | Discharge status: Home / Self Care | Payer: Medicare Other | Source: Ambulatory Visit | Attending: Radiation Oncology | Admitting: Radiation Oncology

## 2012-11-04 DIAGNOSIS — R51 Headache: Secondary | ICD-10-CM | POA: Insufficient documentation

## 2012-11-04 DIAGNOSIS — IMO0001 Reserved for inherently not codable concepts without codable children: Secondary | ICD-10-CM | POA: Insufficient documentation

## 2012-11-04 DIAGNOSIS — C50919 Malignant neoplasm of unspecified site of unspecified female breast: Secondary | ICD-10-CM | POA: Insufficient documentation

## 2012-11-04 DIAGNOSIS — C50519 Malignant neoplasm of lower-outer quadrant of unspecified female breast: Secondary | ICD-10-CM

## 2012-11-04 DIAGNOSIS — Z510 Encounter for antineoplastic radiation therapy: Secondary | ICD-10-CM | POA: Insufficient documentation

## 2012-11-04 DIAGNOSIS — L539 Erythematous condition, unspecified: Secondary | ICD-10-CM | POA: Insufficient documentation

## 2012-11-04 NOTE — Telephone Encounter (Signed)
Met with pt to discuss RO billing. Pt had a little fin concerns, but advised she will call if need be.  Dx: 174.5 Malignant neoplasm of lower-outer quadrant of female breast  Attending Rad: Dr. Basilio Cairo  Rad Tx:  Extrl Beam  Daily

## 2012-11-04 NOTE — Progress Notes (Signed)
SIMULATION / TREATMENT PLANNING NOTE:   Diagnosis: Right breast cancer  The patient was taken to the CT simulator and laid in the supine position, arms over her head, and her head in an Accuform device.  High resolution CT axial imaging was obtained of the patient's right breast and chest.  An isocenter was placed in the anterior right lung.   Her anatomy is conducive to hypofractionation. I plan to treat the patient's right breast with opposed tangents, using MLCs for custom blocks, to a dose of 42.56 Gy in 16 fractions.  I will not deliver a boost.  I am limiting lung exposure as much as I reasonably can, in light of her asthma and concerns re: lung toxicity.

## 2012-11-11 ENCOUNTER — Ambulatory Visit
Admission: RE | Admit: 2012-11-11 | Discharge: 2012-11-11 | Disposition: A | Discharge status: Home / Self Care | Payer: Medicare Other | Source: Ambulatory Visit | Attending: Radiation Oncology | Admitting: Radiation Oncology

## 2012-11-11 ENCOUNTER — Encounter: Payer: Self-pay | Admitting: Radiation Oncology

## 2012-11-12 ENCOUNTER — Encounter: Payer: Self-pay | Admitting: Oncology

## 2012-11-12 ENCOUNTER — Ambulatory Visit (HOSPITAL_BASED_OUTPATIENT_CLINIC_OR_DEPARTMENT_OTHER): Payer: Medicare Other

## 2012-11-12 ENCOUNTER — Ambulatory Visit
Admission: RE | Admit: 2012-11-12 | Discharge: 2012-11-12 | Disposition: A | Discharge status: Home / Self Care | Payer: Medicare Other | Source: Ambulatory Visit | Attending: Radiation Oncology | Admitting: Radiation Oncology

## 2012-11-12 ENCOUNTER — Other Ambulatory Visit (HOSPITAL_BASED_OUTPATIENT_CLINIC_OR_DEPARTMENT_OTHER): Payer: Medicare Other | Admitting: Lab

## 2012-11-12 ENCOUNTER — Ambulatory Visit (HOSPITAL_BASED_OUTPATIENT_CLINIC_OR_DEPARTMENT_OTHER): Payer: Medicare Other | Admitting: Oncology

## 2012-11-12 VITALS — BP 135/78 | HR 73 | Temp 97.5°F | Resp 20 | Ht 65.5 in | Wt 119.6 lb

## 2012-11-12 DIAGNOSIS — D0590 Unspecified type of carcinoma in situ of unspecified breast: Secondary | ICD-10-CM

## 2012-11-12 DIAGNOSIS — C50911 Malignant neoplasm of unspecified site of right female breast: Secondary | ICD-10-CM

## 2012-11-12 DIAGNOSIS — Z170 Estrogen receptor positive status [ER+]: Secondary | ICD-10-CM

## 2012-11-12 DIAGNOSIS — D0510 Intraductal carcinoma in situ of unspecified breast: Secondary | ICD-10-CM

## 2012-11-12 LAB — CBC WITH DIFFERENTIAL/PLATELET
Basophils Absolute: 0 10*3/uL (ref 0.0–0.1)
Eosinophils Absolute: 0.3 10*3/uL (ref 0.0–0.5)
HGB: 11.6 g/dL (ref 11.6–15.9)
NEUT#: 3.8 10*3/uL (ref 1.5–6.5)
RDW: 14.4 % (ref 11.2–14.5)
WBC: 6.5 10*3/uL (ref 3.9–10.3)
lymph#: 1.8 10*3/uL (ref 0.9–3.3)

## 2012-11-12 LAB — COMPREHENSIVE METABOLIC PANEL (CC13)
AST: 22 U/L (ref 5–34)
Albumin: 3.4 g/dL — ABNORMAL LOW (ref 3.5–5.0)
BUN: 17 mg/dL (ref 7.0–26.0)
Calcium: 9.1 mg/dL (ref 8.4–10.4)
Chloride: 103 mEq/L (ref 98–107)
Glucose: 103 mg/dl — ABNORMAL HIGH (ref 70–99)
Potassium: 3.3 mEq/L — ABNORMAL LOW (ref 3.5–5.1)
Sodium: 141 mEq/L (ref 136–145)
Total Protein: 6.5 g/dL (ref 6.4–8.3)

## 2012-11-12 MED ORDER — TAMOXIFEN CITRATE 20 MG PO TABS: 20.0000 mg | ORAL_TABLET | Freq: Every day | ORAL | Status: DC

## 2012-11-12 MED ORDER — VENLAFAXINE HCL ER 37.5 MG PO CP24: 37.5000 mg | ORAL_CAPSULE | Freq: Every day | ORAL | Status: DC

## 2012-11-12 NOTE — Progress Notes (Signed)
Simulation Verification Note: Right Breast DCIS Date of Service 11-11-12  The patient was brought to the treatment unit and placed in the planned treatment position. The clinical setup was verified. Then port films were obtained and uploaded to the radiation oncology medical record software.  The treatment beams were carefully compared against the planned radiation fields. The position location and shape of the radiation fields was reviewed. They targeted volume of tissue appears to be appropriately covered by the radiation beams. Organs at risk appear to be excluded as planned.  Based on my personal review, I approved the simulation verification. The patient's treatment will proceed as planned.  -----------------------------------  Lonie Peak, MD

## 2012-11-12 NOTE — Progress Notes (Signed)
ID: [name]   DOB: [birth year]  MR#: [id number]  NWG#:[id number]  PCP: Sheri Freud, DO GYN:  SU: Sheri Kin MD OTHER MD: [name], [name], [name]   HISTORY OF PRESENT ILLNESS: [name] noted a minimal discharge from the right breast early and 2013. She did not think much of this until it became more copious him a and then she brought it to Dr. Estrellita Ludwig attention and bilateral breast MRI 07/29/2012 showed 2 areas of concern in the right breast. Right breast biopsy x3 was obtained 08/23/2012, and showed ([id number]) atypical ductal hyperplasia partially involving a papillary lesion. Accordingly, on 10/11/2012 the patient underwent right lumpectomy for what proved to be ([id number] ductal carcinoma in situ, low-grade, measuring approximately 3.2 cm, with negative margins (0.15 cm), 100% estrogen receptor positive, 61% progesterone receptor positive.  With his results the patient was referred to radiation oncology and she is currently receiving of radiation treatments under Dr. Basilio Cairo, scheduled to be completed mid December 2013.  The patient's subsequent history is as detailed below.  INTERVAL HISTORY: [name] was seen with her husband [name] on 11/12/2012 at the breast clinic.  REVIEW OF SYSTEMS: She is tolerating the radiation well, with no significant side effects so far. She had a significant ecchymosis at the time of initial breast biopsy, but that had resolved the time of surgery (it is the reason the surgery had to be postponed). She has a history of fibromyalgia, and has frequent headaches, as well as throbbing stabbing of back and joint pain. These are intermittent. Since she stopped her hormone replacement she's had significant night sweats and also has gained 10 pounds. She has some ringing in her years. She has difficulty swallowing and a sore throat. She has a history of irregular heartbeat. Sometimes her ankles swell bilaterally. She tells me her appetite is poor. She  has a hiatal hernia. She has a history of skin cancer in her right arm. She describes herself is anxious and depressed. Hot flashes are her major problem right now. Otherwise a detailed review of systems was noncontributory.  PAST MEDICAL HISTORY: Past Medical History  Diagnosis Date  . GERD (gastroesophageal reflux disease)   . Arthritis   . Asthma   . Hypertension   . Migraine   . History of colonic polyps   . Hypothyroidism   . Bronchitis     chronic  . Irregular heartbeat     Tachyarrythmia  . CAD (coronary artery disease)     mild non-obstructive cad  . Skin cancer   . Cervical cancer 1991    Tx  F5U  . Cancer of right breast 10/11/12  . Fibromyalgia     PAST SURGICAL HISTORY: Past Surgical History  Procedure Date  . Appendectomy   . Tonsillectomy   . Skin cancer removal   . Vocal cord cyst removal   . Nasal sinus surgery   . Right ankle   . Right hand surgery   . Abdominal hysterectomy     with bilateral salpingo-oophorectomy  . Breast biopsy     multiple --all neg  . Lumpectomy right breast 10/11/12    FAMILY HISTORY Family History  Problem Relation Age of Onset  . Rectal cancer Mother   . Cancer Mother     rectal,  breast  . Irritable bowel syndrome Son   . Irritable bowel syndrome Daughter   . Heart disease Other   . Arthritis Other   . Cancer Other  breast cancer 1st degree relative 67, lung  . Diabetes Other     1st degree relative   . Hyperlipidemia Other   . Hypertension Other   . Osteoporosis Other   . Coronary artery disease Other 66  . Cancer Maternal Aunt     breast    GYNECOLOGIC HISTORY: Menarche age 66, first live birth age 66, TAH/BSO in 77. She was on hormone replacement between 1982 and 2013, at the time of her breast cancer diagnosis.  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 Her husband [name] used to be IT consultant 4  lucent, now is retired and has helped manage a friend's car lot, something he sees as an location of her ministry. Their children are [name], who is an Art gallery manager with [employer] in [place]; [name], who works as an International aid/development worker for [employer] and lives in [place]; and [name], who works as a Chartered loss adjuster (fourth-grade) in [place].   ADVANCED DIRECTIVES: In place  HEALTH MAINTENANCE: History  Substance Use Topics  . Smoking status: Former Smoker -- 0.2 packs/day for 0 years    Types: Cigarettes    Quit date: 05/27/2012  . Smokeless tobacco: Never Used     Comment: 1 pack per week  . Alcohol Use: Yes     Comment: occasional     Colonoscopy: 09/29/2010 [name])  PAP: JAN 2013 ([facility])  Bone density: 04/17/2011/ osteoporosis  Lipid panel:  Allergies  Allergen Reactions  . Clindamycin Hcl Shortness Of Breath and Rash  . Penicillins Anaphylaxis  . Prednisone Shortness Of Breath  . Codeine Hives, Itching and Other (See Comments)    headache  . Latex Hives  . Pentazocine Lactate   . Pneumococcal Vaccine Polyvalent     REACTION: redness, swelling, and hives at injection site    Current Outpatient Prescriptions  Medication Sig Dispense Refill  . albuterol (PROVENTIL HFA;VENTOLIN HFA) 108 (90 BASE) MCG/ACT inhaler Inhale 2 puffs into the lungs every 4 (four) hours as needed.      Marland Kitchen amLODipine (NORVASC) 2.5 MG tablet Take 2.5 mg by mouth daily.      Marland Kitchen aspirin 81 MG tablet Take 81 mg by mouth daily.        Marland Kitchen atenolol (TENORMIN) 50 MG tablet Take 50 mg by mouth daily. 1/2 tablet daily      . DULoxetine (CYMBALTA) 60 MG capsule Take 1 capsule (60 mg total) by mouth 2 (two) times daily.  180 capsule  3  . esomeprazole (NEXIUM) 40 MG capsule Take 1 capsule 30 min before breakfast. Lot# [id number] Exp date: 03/2014  30 capsule  0  . furosemide (LASIX) 20 MG tablet As needed      . ibuprofen (ADVIL,MOTRIN) 200 MG tablet Take 800 mg by mouth every 6  (six) hours as needed. For pain      . imipramine (TOFRANIL) 25 MG tablet Take 1 tablet (25 mg total) by mouth at bedtime.  90 tablet  0  . mometasone (NASONEX) 50 MCG/ACT nasal spray Place 2 sprays into the nose daily as needed.       . Multiple Vitamin (MULTIVITAMIN) tablet Take 1 tablet by mouth daily.       . polyethylene glycol (MIRALAX / GLYCOLAX) packet Take 17 g by mouth daily.       . Probiotic Product (PA PROBIOTIC COMPLEX) TABS Take 1 tablet by mouth  daily.        . rosuvastatin (CRESTOR) 10 MG tablet Take 1 tablet (10 mg total) by mouth daily.  90 tablet  3  . traMADol (ULTRAM) 50 MG tablet Take 1-2 tablets (50-100 mg total) by mouth every 6 (six) hours as needed for pain.  30 tablet  1  . traZODone (DESYREL) 100 MG tablet 2 po qhs prn  60 tablet  0  . B Complex Vitamins (B-COMPLEX/B-12 PO) Take 1 tablet by mouth daily.        . Calcium Carb-Cholecalciferol (CALCIUM 1000 + D PO) Take 2 tablets by mouth daily.        . Cholecalciferol (VITAMIN D) 1000 UNITS capsule Take 1,000 Units by mouth daily.        Tery Sanfilippo Calcium (STOOL SOFTENER PO) Take by mouth. As needed       . tamoxifen (NOLVADEX) 20 MG tablet Take 1 tablet (20 mg total) by mouth daily.  90 tablet  12  . venlafaxine XR (EFFEXOR-XR) 37.5 MG 24 hr capsule Take 1 capsule (37.5 mg total) by mouth daily.  90 capsule  12    OBJECTIVE: middle-aged white women who looks well Filed Vitals:   11/12/12 1608  BP: 135/78  Pulse: 73  Temp: 97.5 F (36.4 C)  Resp: 20     Body mass index is 19.60 kg/(m^2).    ECOG FS: 0  Sclerae unicteric Oropharynx clear No cervical or supraclavicular adenopathy Lungs no rales or rhonchi Heart regular rate and rhythm Abd benign MSK no focal spinal tenderness, no peripheral edema Neuro: nonfocal Breasts: The right breast is status post lumpectomy; the incision is healing very nicely, with no erythema, dehiscence, or unusual swelling. The cosmetic result is excellent. The left breast is  unremarkable.   LAB RESULTS: Lab Results  Component Value Date   WBC 6.5 11/12/2012   NEUTROABS 3.8 11/12/2012   HGB 11.6 11/12/2012   HCT 34.8 11/12/2012   MCV 91.6 11/12/2012   PLT 226 11/12/2012      Chemistry      Component Value Date/Time   NA 141 11/12/2012 1549   NA 139 10/09/2012 1230   K 3.3* 11/12/2012 1549   K 4.1 10/09/2012 1230   CL 103 11/12/2012 1549   CL 99 10/09/2012 1230   CO2 30* 11/12/2012 1549   CO2 30 10/09/2012 1230   BUN 17.0 11/12/2012 1549   BUN 15 10/09/2012 1230   CREATININE 1.0 11/12/2012 1549   CREATININE 0.78 10/09/2012 1230      Component Value Date/Time   CALCIUM 9.1 11/12/2012 1549   CALCIUM 8.9 10/09/2012 1230   ALKPHOS 90 11/12/2012 1549   ALKPHOS 73 07/22/2012 1036   AST 22 11/12/2012 1549   AST 25 07/22/2012 1036   ALT 16 11/12/2012 1549   ALT 16 07/22/2012 1036   BILITOT 0.24 11/12/2012 1549   BILITOT 0.4 07/22/2012 1036       No results found for this basename: LABCA2    No components found with this basename: LABCA125    No results found for this basename: INR:1;PROTIME:1 in the last 168 hours  Urinalysis    Component Value Date/Time   COLORURINE LT. YELLOW 07/19/2011 1457   APPEARANCEUR CLEAR 07/19/2011 1457   LABSPEC 1.015 07/19/2011 1457   PHURINE 6.5 07/19/2011 1457   HGBUR NEGATIVE 07/19/2011 1457   BILIRUBINUR Neg 07/22/2012 1504   BILIRUBINUR NEGATIVE 07/19/2011 1457   KETONESUR NEGATIVE 07/19/2011 1457   UROBILINOGEN 0.2 07/22/2012 1504  UROBILINOGEN 0.2 07/19/2011 1457   NITRITE Neg 07/22/2012 1504   NITRITE NEGATIVE 07/19/2011 1457   LEUKOCYTESUR Negative 07/22/2012 1504    STUDIES: No results found.  ASSESSMENT: 66 y.o. [name] woman s/p Right lumpectomy 10/11/2012 for low-grade ductal carcinoma in situ, estrogen and progesterone receptor positive, with negative though close margins  (1) receiving radiation therapy to be completed 12/11 2013  (2) tamoxifen to be started at the completion of  radiation  PLAN: We went over his situation in detail and she understands noninvasive breast cancer is not immediately life-threatening. I quoted her a risk of approximately 24% local recurrence with surgery alone, cut down to approximately 10% after radiation. This means anti-estrogens would decrease that risk by about 5% or, to put it negatively, 95 women out of 100 like her would get no benefit from antiestrogen as far as this cancers recurrence is concerned.  On the other hand she has approximately 1% per year risk of developing a new breast cancer in either breast. Anti-estrogens would also cut that risk in half, and that is a more considerable risk reduction, in the 10-15% range.  We then discussed the possible toxicities side effects and complications of tamoxifen versus anastrozole, and particularly since she is status post hysterectomy, and a ready has osteoporosis, I think tamoxifen would be a better choice for her. It is also in her favor that she took hormone replacement for more than 20 years with no clotting complications  Accordingly she will start tamoxifen as soon as she completes radiation. In the interim she is plagued by hot flashes and we are going to try venlafaxine at 37.5 mg daily at to see if that helps. She will let us know after 2-3 weeks whether she would like to have the dose increased. She is going to see me again in March. If she is tolerating tamoxifen well at that time we will start seeing her on a once a year basis.   Cellie Dardis C    11/12/2012

## 2012-11-13 ENCOUNTER — Ambulatory Visit
Admission: RE | Admit: 2012-11-13 | Discharge: 2012-11-13 | Disposition: A | Discharge status: Home / Self Care | Payer: Medicare Other | Source: Ambulatory Visit | Attending: Radiation Oncology | Admitting: Radiation Oncology

## 2012-11-13 ENCOUNTER — Telehealth: Payer: Self-pay | Admitting: Oncology

## 2012-11-13 NOTE — Telephone Encounter (Signed)
S/w the pt and she is aware of her march 2014 appt °

## 2012-11-14 ENCOUNTER — Ambulatory Visit
Admission: RE | Admit: 2012-11-14 | Discharge: 2012-11-14 | Disposition: A | Discharge status: Home / Self Care | Payer: Medicare Other | Source: Ambulatory Visit | Attending: Radiation Oncology | Admitting: Radiation Oncology

## 2012-11-15 ENCOUNTER — Ambulatory Visit
Admission: RE | Admit: 2012-11-15 | Discharge: 2012-11-15 | Disposition: A | Discharge status: Home / Self Care | Payer: Medicare Other | Source: Ambulatory Visit | Attending: Radiation Oncology | Admitting: Radiation Oncology

## 2012-11-16 ENCOUNTER — Ambulatory Visit: Payer: Medicare Other

## 2012-11-18 ENCOUNTER — Encounter: Payer: Self-pay | Admitting: Radiation Oncology

## 2012-11-18 ENCOUNTER — Ambulatory Visit
Admission: RE | Admit: 2012-11-18 | Discharge: 2012-11-18 | Disposition: A | Discharge status: Home / Self Care | Payer: Medicare Other | Source: Ambulatory Visit | Attending: Radiation Oncology | Admitting: Radiation Oncology

## 2012-11-18 VITALS — BP 130/79 | HR 69 | Temp 97.9°F | Wt 119.5 lb

## 2012-11-18 DIAGNOSIS — C50519 Malignant neoplasm of lower-outer quadrant of unspecified female breast: Secondary | ICD-10-CM

## 2012-11-18 NOTE — Progress Notes (Signed)
   Weekly Management Note Current Dose:   13.3Gy  Projected Dose:  42.56 Gy   Narrative:  The patient presents for routine under treatment assessment.  CBCT/MVCT images/Port film x-rays were reviewed.  The chart was checked. Doing well, but gaining weight still, off of Premarin. Frustrated by this.  Effexor Rx'd for Hot flashes - she will probably start this soon. She has been prescribed tamoxifen to start after completion of radiotherapy  Physical Findings:  weight is 119 lb 8 oz (54.205 kg). Her temperature is 97.9 F (36.6 C). Her blood pressure is 130/79 and her pulse is 69.  no acute distress. Minimal erythema over her right breast.  Impression:  The patient is tolerating radiotherapy.  Plan:  Continue radiotherapy as planned. Will give her a pamphlet for finding your new normal class after completion of radiotherapy  ________________________________   Lonie Peak, M.D.

## 2012-11-18 NOTE — Progress Notes (Addendum)
Ms. Mcgath has received 5 fractions to her right breast.  She c/o 8-10 episodes of hot flashes nightly and headaches daily.  Has Effexor but has not started.

## 2012-11-19 ENCOUNTER — Ambulatory Visit
Admission: RE | Admit: 2012-11-19 | Discharge: 2012-11-19 | Disposition: A | Discharge status: Home / Self Care | Payer: Medicare Other | Source: Ambulatory Visit | Attending: Radiation Oncology | Admitting: Radiation Oncology

## 2012-11-20 ENCOUNTER — Encounter: Payer: Self-pay | Admitting: *Deleted

## 2012-11-20 ENCOUNTER — Ambulatory Visit
Admission: RE | Admit: 2012-11-20 | Discharge: 2012-11-20 | Disposition: A | Discharge status: Home / Self Care | Payer: Medicare Other | Source: Ambulatory Visit | Attending: Radiation Oncology | Admitting: Radiation Oncology

## 2012-11-20 NOTE — Progress Notes (Signed)
Mailed after appt letter to pt. 

## 2012-11-22 ENCOUNTER — Ambulatory Visit: Payer: Medicare Other

## 2012-11-25 ENCOUNTER — Ambulatory Visit
Admission: RE | Admit: 2012-11-25 | Discharge: 2012-11-25 | Disposition: A | Discharge status: Home / Self Care | Payer: Medicare Other | Source: Ambulatory Visit | Attending: Radiation Oncology | Admitting: Radiation Oncology

## 2012-11-25 VITALS — BP 136/73 | HR 71 | Temp 98.5°F | Wt 117.1 lb

## 2012-11-25 DIAGNOSIS — C50519 Malignant neoplasm of lower-outer quadrant of unspecified female breast: Secondary | ICD-10-CM

## 2012-11-25 NOTE — Progress Notes (Signed)
Patient here for weekly assessment of left breast radiation.Slight changes in skin.To continue application of radiaplex.Feels "blagh" for the last few days.Denies pain..Cymblata increased to 120 mg.

## 2012-11-25 NOTE — Progress Notes (Signed)
   Weekly Management Note Current Dose:   21.28Gy  Projected Dose:  42.56Gy   Narrative:  The patient presents for routine under treatment assessment.  CBCT/MVCT images/Port film x-rays were reviewed.  The chart was checked. She recently increased her Cymbalta to 120 mg twice a day. She reports that she has had headaches, and possibly increased symptoms from her fibromyalgia. Feels more tired than usual.   Physical Findings:  weight is 117 lb 1.6 oz (53.116 kg). Her temperature is 98.5 F (36.9 C). Her blood pressure is 136/73 and her pulse is 71.  slightly erythematous right breast.  Impression:  The patient is tolerating radiotherapy.  Plan:  Continue radiotherapy as planned. Instructed to start using the topical lotions given by nursing.  ________________________________   Lonie Peak, M.D.

## 2012-11-26 ENCOUNTER — Ambulatory Visit
Admission: RE | Admit: 2012-11-26 | Discharge: 2012-11-26 | Disposition: A | Discharge status: Home / Self Care | Payer: Medicare Other | Source: Ambulatory Visit | Attending: Radiation Oncology | Admitting: Radiation Oncology

## 2012-11-27 ENCOUNTER — Ambulatory Visit
Admission: RE | Admit: 2012-11-27 | Discharge: 2012-11-27 | Disposition: A | Discharge status: Home / Self Care | Payer: Medicare Other | Source: Ambulatory Visit | Attending: Radiation Oncology | Admitting: Radiation Oncology

## 2012-11-28 ENCOUNTER — Ambulatory Visit
Admission: RE | Admit: 2012-11-28 | Discharge: 2012-11-28 | Disposition: A | Discharge status: Home / Self Care | Payer: Medicare Other | Source: Ambulatory Visit | Attending: Radiation Oncology | Admitting: Radiation Oncology

## 2012-11-29 ENCOUNTER — Ambulatory Visit
Admission: RE | Admit: 2012-11-29 | Discharge: 2012-11-29 | Disposition: A | Discharge status: Home / Self Care | Payer: Medicare Other | Source: Ambulatory Visit | Attending: Radiation Oncology | Admitting: Radiation Oncology

## 2012-12-02 ENCOUNTER — Ambulatory Visit
Admission: RE | Admit: 2012-12-02 | Discharge: 2012-12-02 | Disposition: A | Discharge status: Home / Self Care | Payer: Medicare Other | Source: Ambulatory Visit | Attending: Radiation Oncology | Admitting: Radiation Oncology

## 2012-12-02 ENCOUNTER — Encounter: Payer: Self-pay | Admitting: Radiation Oncology

## 2012-12-02 VITALS — BP 133/76 | HR 72 | Temp 98.6°F | Wt 120.9 lb

## 2012-12-02 DIAGNOSIS — C50519 Malignant neoplasm of lower-outer quadrant of unspecified female breast: Secondary | ICD-10-CM

## 2012-12-02 MED ORDER — BIAFINE EX EMUL
CUTANEOUS | Status: DC | PRN
  Administered 2012-12-02: 12:00:00 via TOPICAL

## 2012-12-02 NOTE — Progress Notes (Signed)
13/16 fractions to right breast.  C/o soreness , but no overt pain.  Note faint erythema with rash like appearance in the upper portion of right breast with mild hyperpigmentation on the underside of her right breast.  Given 2nd tube of Biafine.

## 2012-12-02 NOTE — Progress Notes (Signed)
   Weekly Management Note Current Dose:  34.58 Gy  Projected Dose: 42.56 Gy   Narrative:  The patient presents for routine under treatment assessment.  CBCT/MVCT images/Port film x-rays were reviewed.  The chart was checked. She is doing well. Minimal skin irritation.  Physical Findings:  weight is 120 lb 14.4 oz (54.84 kg). Her temperature is 98.6 F (37 C). Her blood pressure is 133/76 and her pulse is 72.  Early/modest erythema over her right breast, skin is intact  Impression:  The patient is tolerating radiotherapy.  Plan:  Continue radiotherapy as planned. I will see her back in one month after completion of radiotherapy  ________________________________   Lonie Peak, M.D.

## 2012-12-03 ENCOUNTER — Ambulatory Visit
Admission: RE | Admit: 2012-12-03 | Discharge: 2012-12-03 | Disposition: A | Discharge status: Home / Self Care | Payer: Medicare Other | Source: Ambulatory Visit | Attending: Radiation Oncology | Admitting: Radiation Oncology

## 2012-12-04 ENCOUNTER — Ambulatory Visit
Admission: RE | Admit: 2012-12-04 | Discharge: 2012-12-04 | Disposition: A | Discharge status: Home / Self Care | Payer: Medicare Other | Source: Ambulatory Visit | Attending: Radiation Oncology | Admitting: Radiation Oncology

## 2012-12-05 ENCOUNTER — Ambulatory Visit
Admission: RE | Admit: 2012-12-05 | Discharge: 2012-12-05 | Disposition: A | Discharge status: Home / Self Care | Payer: Medicare Other | Source: Ambulatory Visit | Attending: Radiation Oncology | Admitting: Radiation Oncology

## 2012-12-05 ENCOUNTER — Encounter: Payer: Self-pay | Admitting: Radiation Oncology

## 2012-12-05 ENCOUNTER — Ambulatory Visit: Payer: Medicare Other

## 2012-12-05 VITALS — BP 118/64 | HR 79 | Temp 98.5°F | Wt 117.3 lb

## 2012-12-05 DIAGNOSIS — C50519 Malignant neoplasm of lower-outer quadrant of unspecified female breast: Secondary | ICD-10-CM

## 2012-12-05 NOTE — Progress Notes (Signed)
Patient has completed radiation treatments to right breast.Doing great.Very mild redness without peeling.Continue application of moisturizer.Fatigue relieved with short rest periods.One month follow up scheduled with Dr.Squire.

## 2012-12-05 NOTE — Progress Notes (Signed)
Weekly Management Note:  Site: Right breast Current Dose:  4256  cGy Projected Dose: 4256  cGy  Narrative: The patient is seen today for routine under treatment assessment. CBCT/MVCT images/port films were reviewed. The chart was reviewed.   She finishes her radiation therapy today. She uses Biafine cream. She has noted more erythema over the past few days.  Physical Examination:  Filed Vitals:   12/05/12 1208  BP: 118/64  Pulse: 79  Temp: 98.5 F (36.9 C)  .  Weight: 117 lb 4.8 oz (53.207 kg). There is no palpable erythema along the superior aspect of the right breast. No areas of desquamation.  Impression: Radiation therapy well tolerated.  Plan: Followup appointment with Dr. Basilio Cairo in one month.

## 2012-12-06 ENCOUNTER — Ambulatory Visit: Payer: Medicare Other

## 2012-12-08 ENCOUNTER — Other Ambulatory Visit: Payer: Self-pay | Admitting: Family Medicine

## 2012-12-08 NOTE — Progress Notes (Deleted)
°  Radiation Oncology         [phone]) [phone] ________________________________  Name: [name] MRN: [id number]  Date: 12/05/2012  DOB: 1946/09/01  End of Treatment Note  Diagnosis:  Low-grade DCIS, ER/PR positive, right breast    Indication for treatment: Curative  Radiation treatment dates:   11/12/2012-12/05/2012  Site/dose:  Right breast/ 42.56 Wallace Cullens in 16 fractions  Beams/energy:  Opposed tangents/6 MV photons  Narrative: The patient tolerated radiation treatment well. Her skin irritation with modest. The patient's treatment was designed to minimize her lung exposure as much as possible, due to the patient's history of underlying obstructive lung disease and concerns regarding lung toxicity.  Plan: The patient has completed radiation treatment. She has plans to continue onto tamoxifen antiestrogen therapy. The patient will return to radiation oncology clinic for routine followup in one month. I advised them to call or return sooner if they have any questions or concerns related to their recovery or treatment.  -----------------------------------  Lonie Peak, MD

## 2012-12-09 ENCOUNTER — Ambulatory Visit: Payer: Medicare Other

## 2012-12-09 ENCOUNTER — Encounter: Payer: Medicare Other | Admitting: Radiation Oncology

## 2012-12-10 ENCOUNTER — Ambulatory Visit: Payer: Medicare Other

## 2012-12-11 ENCOUNTER — Ambulatory Visit: Payer: Medicare Other

## 2012-12-12 ENCOUNTER — Ambulatory Visit: Payer: Medicare Other

## 2012-12-13 ENCOUNTER — Ambulatory Visit: Payer: Medicare Other

## 2012-12-13 ENCOUNTER — Other Ambulatory Visit: Payer: Self-pay | Admitting: Family Medicine

## 2012-12-13 DIAGNOSIS — F329 Major depressive disorder, single episode, unspecified: Secondary | ICD-10-CM

## 2012-12-13 DIAGNOSIS — F32A Depression, unspecified: Secondary | ICD-10-CM

## 2012-12-13 NOTE — Telephone Encounter (Signed)
Patient aware medications ready for pick up.    KP

## 2012-12-13 NOTE — Progress Notes (Signed)
Radiation Oncology ([phone]) [phone]  ________________________________  Name: [name] MRN: [id number]  Date: 12/05/2012 DOB: 07-26-1946  End of Treatment Note   Diagnosis: Low-grade DCIS, ER/PR positive, right breast   Indication for treatment: Curative   Radiation treatment dates: 11/12/2012-12/05/2012   Site/dose: Right breast/ 42.56 Wallace Cullens in 16 fractions   Beams/energy: Opposed tangents/6 MV photons   Narrative: The patient tolerated radiation treatment well. Her skin irritation with modest. The patient's treatment was designed to minimize her lung exposure as much as possible, due to the patient's history of underlying obstructive lung disease and concerns regarding lung toxicity.   Plan: The patient has completed radiation treatment. She has plans to continue onto tamoxifen antiestrogen therapy. The patient will return to radiation oncology clinic for routine followup in one month. I advised them to call or return sooner if they have any questions or concerns related to their recovery or treatment.    -----------------------------------  Lonie Peak, MD

## 2012-12-13 NOTE — Telephone Encounter (Signed)
Patient is requesting samples of Crestor and Cymbalta to pick up today. Last seen and filled the Trazodone on 10/31/12. Please advise      KP

## 2012-12-13 NOTE — Telephone Encounter (Signed)
pt request crestor 10mg  & 20mg  Cymbalta per pt would like to pick up today cb# [phone]

## 2012-12-16 ENCOUNTER — Ambulatory Visit: Payer: Medicare Other

## 2012-12-17 ENCOUNTER — Ambulatory Visit: Payer: Medicare Other

## 2012-12-19 ENCOUNTER — Ambulatory Visit: Payer: Medicare Other

## 2012-12-20 ENCOUNTER — Ambulatory Visit: Payer: Medicare Other

## 2012-12-23 ENCOUNTER — Ambulatory Visit: Payer: Medicare Other

## 2012-12-24 ENCOUNTER — Ambulatory Visit: Payer: Medicare Other

## 2012-12-25 DIAGNOSIS — A419 Sepsis, unspecified organism: Secondary | ICD-10-CM

## 2012-12-25 HISTORY — DX: Sepsis, unspecified organism: A41.9

## 2012-12-26 ENCOUNTER — Ambulatory Visit: Payer: Medicare Other

## 2012-12-27 ENCOUNTER — Ambulatory Visit: Payer: Medicare Other

## 2012-12-30 ENCOUNTER — Ambulatory Visit: Payer: Medicare Other

## 2012-12-31 ENCOUNTER — Ambulatory Visit: Payer: Medicare Other

## 2013-01-02 ENCOUNTER — Encounter: Payer: Self-pay | Admitting: Radiation Oncology

## 2013-01-03 ENCOUNTER — Ambulatory Visit
Admission: RE | Admit: 2013-01-03 | Discharge: 2013-01-03 | Disposition: A | Discharge status: Home / Self Care | Payer: Medicare Other | Source: Ambulatory Visit | Attending: Radiation Oncology | Admitting: Radiation Oncology

## 2013-01-03 ENCOUNTER — Encounter: Payer: Self-pay | Admitting: Radiation Oncology

## 2013-01-03 VITALS — BP 142/83 | HR 75 | Temp 97.9°F | Wt 122.4 lb

## 2013-01-03 DIAGNOSIS — C50519 Malignant neoplasm of lower-outer quadrant of unspecified female breast: Secondary | ICD-10-CM

## 2013-01-03 DIAGNOSIS — C50511 Malignant neoplasm of lower-outer quadrant of right female breast: Secondary | ICD-10-CM

## 2013-01-03 HISTORY — DX: Malignant neoplasm of lower-outer quadrant of right female breast: C50.511

## 2013-01-03 HISTORY — DX: Personal history of irradiation: Z92.3

## 2013-01-03 NOTE — Progress Notes (Signed)
Radiation Oncology         [phone] ________________________________  Name: [name] MRN: [id number]  Date: 01/03/2013  DOB: [birth year]  Follow-Up Visit Note  outpatient  CC: Loreen Freud, DO  Kandis Cocking, MD  Diagnosis:   Low-grade DCIS, ER/PR positive, right breast  Previous Radiation:  42.56 Gy in 16 fractions Completed on 12/05/2012 to the right breast  Narrative:  The patient returns today for routine follow-up.  She is doing well. She denies any new palpable lumps or bumps in her breasts. She continues to be followed by medical oncology and is not yet taking tamoxifen, due to anxiety regarding potential side effects. She continues to have hot flashes which started after she discontinued her estrogen supplementation. She continues to have frustration regarding weight gain which followed cessation of estrogen supplementation. She recently broke her foot which has made it difficult to exercise. She is watching her diet. No additional complaints today.                             ALLERGIES:  is allergic to clindamycin hcl; penicillins; prednisone; codeine; latex; pentazocine lactate; and pneumococcal vaccine polyvalent.  Meds: Current Outpatient Prescriptions  Medication Sig Dispense Refill  . albuterol (PROVENTIL HFA;VENTOLIN HFA) 108 (90 BASE) MCG/ACT inhaler Inhale 2 puffs into the lungs every 4 (four) hours as needed.      Marland Kitchen amLODipine (NORVASC) 5 MG tablet       . aspirin 81 MG tablet Take 81 mg by mouth daily.        Marland Kitchen atenolol (TENORMIN) 50 MG tablet Take 50 mg by mouth daily. 1/2 tablet daily      . Docusate Calcium (STOOL SOFTENER PO) Take by mouth. As needed       . DULoxetine (CYMBALTA) 60 MG capsule Take 1 capsule (60 mg total) by mouth 2 (two) times daily.  180 capsule  3  . esomeprazole (NEXIUM) 40 MG capsule Take 1 capsule 30 min before breakfast. Lot# [id number] Exp date: 03/2014  30 capsule  0  . furosemide (LASIX) 20 MG tablet As needed      . ibuprofen  (ADVIL,MOTRIN) 200 MG tablet Take 800 mg by mouth every 6 (six) hours as needed. For pain      . imipramine (TOFRANIL) 25 MG tablet Take 1 tablet (25 mg total) by mouth at bedtime.  90 tablet  0  . mometasone (NASONEX) 50 MCG/ACT nasal spray Place 2 sprays into the nose daily as needed.       . polyethylene glycol (MIRALAX / GLYCOLAX) packet Take 17 g by mouth daily.       . Probiotic Product (PA PROBIOTIC COMPLEX) TABS Take 1 tablet by mouth daily.        . rosuvastatin (CRESTOR) 10 MG tablet Take 1 tablet (10 mg total) by mouth daily.  90 tablet  3  . traMADol (ULTRAM) 50 MG tablet Take 1-2 tablets (50-100 mg total) by mouth every 6 (six) hours as needed for pain.  30 tablet  1  . traZODone (DESYREL) 100 MG tablet TAKE 2 TABLETS BY MOUTH AT BEDTIME  60 tablet  0  . venlafaxine XR (EFFEXOR-XR) 37.5 MG 24 hr capsule Take 1 capsule (37.5 mg total) by mouth daily.  90 capsule  12  . B Complex Vitamins (B-COMPLEX/B-12 PO) Take 1 tablet by mouth daily.        . Calcium Carb-Cholecalciferol (CALCIUM 1000 +  D PO) Take 2 tablets by mouth daily.        Marland Kitchen gabapentin (NEURONTIN) 300 MG capsule 300 mg 3 (three) times daily. 1-2 tabs TID      . Multiple Vitamin (MULTIVITAMIN) tablet Take 1 tablet by mouth daily.       . tamoxifen (NOLVADEX) 20 MG tablet Take 1 tablet (20 mg total) by mouth daily.  90 tablet  12    Physical Findings: The patient is in no acute distress. Patient is alert and oriented.  weight is 122 lb 6.4 oz (55.52 kg). Her temperature is 97.9 F (36.6 C). Her blood pressure is 142/83 and her pulse is 75. .  No significant changes. She has an excellent cosmetic result. Right breast is very faintly hyperpigmented compared to the left. Lumpectomy scar has healed well.   Lab Findings: Lab Results  Component Value Date   WBC 6.5 11/12/2012   HGB 11.6 11/12/2012   HCT 34.8 11/12/2012   MCV 91.6 11/12/2012   PLT 226 11/12/2012    Radiographic Findings: No results  found.  Impression/Plan: This is a very pleasant woman with a history of right breast DCIS.  I have contacted radiology and am awaiting their risks response regarding whether she should undergo MRI or mammography for followup. If my memory serves me correctly, I believe it was mentioned that a prior tumor conference that MRI may be preferable. We will schedule her followup imaging according to radiology his recommendations.   In terms of her concerns regarding tamoxifen side effects, I encouraged her to take tamoxifen for the next 2 months as prescribed and she can review any side effects that have arisen when she meets with her medical oncologist in March.   I also talked to her about the hormonal changes that have gone on in her body since she stopped estrogen supplementation. I told her that her body is not necessarily going to be at the same natural body weight that it had been at previously. I talked to her about accepting her new body and not struggling to keep it at an unrealistic weight. This seemed to resonate with her.  I will see her back on an as-needed basis as she continues with medical oncology. I encouraged her to call if she has any issues or questions in the future.   _____________________________________   Lonie Peak, MD

## 2013-01-03 NOTE — Progress Notes (Signed)
Fu today.  She reports a intermittent dull ache in her right breast that radiates through shoulder blade.  States her  Skin on her right breast with mild redness as compared to the left breast and her skin is intact

## 2013-01-07 ENCOUNTER — Other Ambulatory Visit: Payer: Self-pay | Admitting: Radiation Oncology

## 2013-01-07 DIAGNOSIS — C50911 Malignant neoplasm of unspecified site of right female breast: Secondary | ICD-10-CM

## 2013-01-14 ENCOUNTER — Other Ambulatory Visit: Payer: Self-pay | Admitting: *Deleted

## 2013-01-14 DIAGNOSIS — C50911 Malignant neoplasm of unspecified site of right female breast: Secondary | ICD-10-CM

## 2013-01-14 MED ORDER — TAMOXIFEN CITRATE 20 MG PO TABS: 20.0000 mg | ORAL_TABLET | Freq: Every day | ORAL | Status: DC

## 2013-01-25 HISTORY — PX: OTHER SURGICAL HISTORY: SHX169

## 2013-02-05 DIAGNOSIS — F32A Depression, unspecified: Secondary | ICD-10-CM | POA: Insufficient documentation

## 2013-02-05 DIAGNOSIS — F418 Other specified anxiety disorders: Secondary | ICD-10-CM | POA: Insufficient documentation

## 2013-02-05 DIAGNOSIS — Z8489 Family history of other specified conditions: Secondary | ICD-10-CM | POA: Insufficient documentation

## 2013-02-05 DIAGNOSIS — F329 Major depressive disorder, single episode, unspecified: Secondary | ICD-10-CM

## 2013-02-05 HISTORY — DX: Major depressive disorder, single episode, unspecified: F32.9

## 2013-02-08 ENCOUNTER — Other Ambulatory Visit: Payer: Self-pay | Admitting: Family Medicine

## 2013-02-10 NOTE — Telephone Encounter (Signed)
Last seen 10/31/12 and filled 12/13/12 #60. Please advise       KP

## 2013-02-13 ENCOUNTER — Ambulatory Visit (INDEPENDENT_AMBULATORY_CARE_PROVIDER_SITE_OTHER): Payer: Medicare Other | Admitting: Family Medicine

## 2013-02-13 ENCOUNTER — Encounter: Payer: Self-pay | Admitting: Family Medicine

## 2013-02-13 VITALS — BP 120/76 | HR 73 | Temp 98.1°F | Wt 120.6 lb

## 2013-02-13 DIAGNOSIS — R51 Headache: Secondary | ICD-10-CM

## 2013-02-13 DIAGNOSIS — F32A Depression, unspecified: Secondary | ICD-10-CM

## 2013-02-13 DIAGNOSIS — J019 Acute sinusitis, unspecified: Secondary | ICD-10-CM

## 2013-02-13 DIAGNOSIS — R5383 Other fatigue: Secondary | ICD-10-CM

## 2013-02-13 DIAGNOSIS — R519 Headache, unspecified: Secondary | ICD-10-CM

## 2013-02-13 DIAGNOSIS — G4700 Insomnia, unspecified: Secondary | ICD-10-CM

## 2013-02-13 DIAGNOSIS — R5381 Other malaise: Secondary | ICD-10-CM

## 2013-02-13 DIAGNOSIS — F329 Major depressive disorder, single episode, unspecified: Secondary | ICD-10-CM

## 2013-02-13 MED ORDER — TRAZODONE HCL 100 MG PO TABS: ORAL_TABLET | ORAL | Status: DC

## 2013-02-13 MED ORDER — CLARITHROMYCIN ER 500 MG PO TB24: 1000.0000 mg | ORAL_TABLET | Freq: Every day | ORAL | Status: AC

## 2013-02-13 MED ORDER — DULOXETINE HCL 60 MG PO CPEP: 60.0000 mg | ORAL_CAPSULE | Freq: Two times a day (BID) | ORAL | Status: DC

## 2013-02-13 NOTE — Patient Instructions (Signed)

## 2013-02-14 ENCOUNTER — Encounter: Payer: Self-pay | Admitting: Family Medicine

## 2013-02-14 LAB — CBC WITH DIFFERENTIAL/PLATELET
Basophils Absolute: 0 10*3/uL (ref 0.0–0.1)
Basophils Relative: 0.5 % (ref 0.0–3.0)
Eosinophils Absolute: 0.2 10*3/uL (ref 0.0–0.7)
HCT: 34.5 % — ABNORMAL LOW (ref 36.0–46.0)
Lymphocytes Relative: 23.9 % (ref 12.0–46.0)
Lymphs Abs: 1.3 10*3/uL (ref 0.7–4.0)
MCHC: 33.5 g/dL (ref 30.0–36.0)
MCV: 89.7 fl (ref 78.0–100.0)
Monocytes Absolute: 0.3 10*3/uL (ref 0.1–1.0)
Neutrophils Relative %: 65.2 % (ref 43.0–77.0)
RBC: 3.85 Mil/uL — ABNORMAL LOW (ref 3.87–5.11)
RDW: 15.1 % — ABNORMAL HIGH (ref 11.5–14.6)

## 2013-02-14 LAB — BASIC METABOLIC PANEL
CO2: 31 mEq/L (ref 19–32)
Chloride: 103 mEq/L (ref 96–112)
Creatinine, Ser: 1 mg/dL (ref 0.4–1.2)
GFR: 60.05 mL/min (ref >60.00)
Potassium: 3.4 mEq/L — ABNORMAL LOW (ref 3.5–5.1)

## 2013-02-14 LAB — HEPATIC FUNCTION PANEL
Albumin: 3.8 g/dL (ref 3.5–5.2)
Alkaline Phosphatase: 75 U/L (ref 39–117)
Bilirubin, Direct: 0.1 mg/dL (ref 0.0–0.3)
Total Protein: 6.6 g/dL (ref 6.0–8.3)

## 2013-02-14 LAB — TSH: TSH: 1.57 u[IU]/mL (ref 0.35–5.50)

## 2013-02-14 NOTE — Progress Notes (Signed)
  Subjective:     [name] is a 67 y.o. female who presents for evaluation of sinus pain. Symptoms include: congestion, facial pain, headaches, nasal congestion and sinus pressure. Onset of symptoms was several weeks ago. Symptoms have been gradually worsening since that time. Past history is significant for no history of pneumonia or bronchitis. Patient is a former smoker.  The following portions of the patient's history were reviewed and updated as appropriate: allergies, current medications, past family history, past medical history, past social history, past surgical history and problem list.  Review of Systems Pertinent items are noted in HPI.   Objective:    BP 120/76  Pulse 73  Temp(Src) 98.1 F (36.7 C) (Oral)  Wt 120 lb 9.6 oz (54.704 kg)  BMI 19.76 kg/m2  SpO2 95% General appearance: alert, cooperative, appears stated age and no distress Head: Normocephalic, without obvious abnormality, atraumatic Ears: normal TM's and external ear canals both ears Nose: green discharge, moderate congestion, turbinates red, swollen, sinus tenderness bilateral Throat: lips, mucosa, and tongue normal; teeth and gums normal Neck: mild anterior cervical adenopathy, supple, symmetrical, trachea midline and thyroid not enlarged, symmetric, no tenderness/mass/nodules Lungs: clear to auscultation bilaterally Heart: S1, S2 normal Extremities: extremities normal, atraumatic, no cyanosis or edema    Assessment:    Acute bacterial sinusitis.    Plan:    Nasal steroids per medication orders. Antihistamines per medication orders. Biaxin per medication orders.

## 2013-02-15 ENCOUNTER — Ambulatory Visit (HOSPITAL_COMMUNITY): Admission: RE | Admit: 2013-02-15 | Payer: Medicare Other | Source: Ambulatory Visit

## 2013-02-15 ENCOUNTER — Ambulatory Visit (HOSPITAL_COMMUNITY)
Admission: RE | Admit: 2013-02-15 | Discharge: 2013-02-15 | Disposition: A | Discharge status: Home / Self Care | Payer: Medicare Other | Source: Ambulatory Visit | Attending: Family Medicine | Admitting: Family Medicine

## 2013-02-15 DIAGNOSIS — R519 Headache, unspecified: Secondary | ICD-10-CM

## 2013-02-15 DIAGNOSIS — H9209 Otalgia, unspecified ear: Secondary | ICD-10-CM | POA: Insufficient documentation

## 2013-02-15 DIAGNOSIS — R42 Dizziness and giddiness: Secondary | ICD-10-CM | POA: Insufficient documentation

## 2013-02-15 DIAGNOSIS — Z853 Personal history of malignant neoplasm of breast: Secondary | ICD-10-CM | POA: Insufficient documentation

## 2013-02-15 DIAGNOSIS — H9319 Tinnitus, unspecified ear: Secondary | ICD-10-CM | POA: Insufficient documentation

## 2013-02-15 DIAGNOSIS — R51 Headache: Secondary | ICD-10-CM | POA: Insufficient documentation

## 2013-02-17 LAB — POCT URINALYSIS DIPSTICK
Bilirubin, UA: NEGATIVE
Glucose, UA: NEGATIVE
Leukocytes, UA: NEGATIVE
Nitrite, UA: NEGATIVE
Spec Grav, UA: 1.02
Urobilinogen, UA: 0.2

## 2013-02-18 ENCOUNTER — Telehealth: Payer: Self-pay | Admitting: *Deleted

## 2013-02-18 NOTE — Telephone Encounter (Signed)
Pt called to request clearance for dental care to be sent to [facility] on [address].  Per chart review of care per this office dental clearance given. Release faxed to above office at [phone] with note per chart review of pt receiving Reclast per primary MD in August 2013.

## 2013-02-20 ENCOUNTER — Telehealth: Payer: Self-pay | Admitting: Family Medicine

## 2013-02-20 NOTE — Telephone Encounter (Signed)
Receive from [facility] to [facility]

## 2013-02-24 ENCOUNTER — Ambulatory Visit: Payer: Medicare Other | Admitting: Cardiovascular Disease

## 2013-02-26 ENCOUNTER — Ambulatory Visit (INDEPENDENT_AMBULATORY_CARE_PROVIDER_SITE_OTHER): Payer: Medicare Other | Admitting: Cardiovascular Disease

## 2013-02-26 ENCOUNTER — Encounter: Payer: Self-pay | Admitting: Cardiovascular Disease

## 2013-02-26 VITALS — BP 142/77 | HR 83 | Ht 65.0 in | Wt 118.0 lb

## 2013-02-26 DIAGNOSIS — I2510 Atherosclerotic heart disease of native coronary artery without angina pectoris: Secondary | ICD-10-CM

## 2013-02-26 DIAGNOSIS — R0600 Dyspnea, unspecified: Secondary | ICD-10-CM

## 2013-02-26 DIAGNOSIS — R0609 Other forms of dyspnea: Secondary | ICD-10-CM

## 2013-02-26 MED ORDER — FUROSEMIDE 20 MG PO TABS: 20.0000 mg | ORAL_TABLET | Freq: Every day | ORAL | Status: DC

## 2013-02-26 MED ORDER — POTASSIUM CHLORIDE CRYS ER 20 MEQ PO TBCR: 20.0000 meq | EXTENDED_RELEASE_TABLET | Freq: Every day | ORAL | Status: DC

## 2013-02-26 NOTE — Patient Instructions (Addendum)
Your physician recommends that you schedule a follow-up appointment in:  3-4 weeks  Your physician has recommended you make the following change in your medication: Start furosemide 20 mg by mouth daily. Start K-dur 20 meq by mouth daily

## 2013-02-26 NOTE — Progress Notes (Signed)
History of Present Illness: [name] is a 67 y.o. female who is here today for cardiac follow up. She has a history of non-obstructive CAD, COPD, hypothyroidism, GERD and hypertension. Left heart catheterization 07/2010: pLAD 40%, oLAD 30%, mLAD 30%, pRCA 30%, EF 60%. Last echo 10/2010: EF 55-60%, mild MR. She was admitted 6/10-6/13 secondary to acute respiratory failure in the setting of COPD exacerbation. The patient had hypotension and bradycardia and her atenolol was held. She followed up with Tereso Newcomer, PA-C in June 2013. Apparently, there was a question of volume overload during her admission. Her chest x-ray in the hospital was clear of edema. Her BNP was mildly elevated. She c/o chest pain at the f/u visit in June so stress myoview was arranged. There was no ischemia. LVEF was 68%. There was question of anterior wall defect without ischemia. Echo was arranged to assess wall motion and this was normal. No RWMA. LVEF normal. She was found to have right breast cancer in August and she had a lumpectomy and 5 weeks of radiation. She has had a mass removed from her vocal cords last week. She also fell and broke her foot around Christmas. She has been anemic.   She is here for cardiac follow up. She is feeling depressed. No chest pain. She has not been using Lasix. Her weight is up 5 lbs since July. She has some dyspnea following her vocal cord surgery. Also c/o some lower ext edema at night but resolves while sleeping.    Primary Care Physician: Loreen Freud   Last Lipid Profile:Lipid Panel     Component Value Date/Time   CHOL 161 09/02/2012 1018   TRIG 128.0 09/02/2012 1018   HDL 61.70 09/02/2012 1018   CHOLHDL 3 09/02/2012 1018   VLDL 25.6 09/02/2012 1018   LDLCALC 74 09/02/2012 1018     Past Medical History  Diagnosis Date  . GERD (gastroesophageal reflux disease)   . Arthritis   . Asthma   . Hypertension   . Migraine   . History of colonic polyps   . Hypothyroidism   . Bronchitis       chronic  . Irregular heartbeat     Tachyarrythmia  . CAD (coronary artery disease)     mild non-obstructive cad  . Skin cancer   . Cervical cancer 1991    Tx  F5U  . Cancer of right breast 10/11/12  . Fibromyalgia   . S/P radiation therapy 11/12/12 - 12/05/12    right Breast  . Use of tamoxifen (Nolvadex)     Past Surgical History  Procedure Laterality Date  . Appendectomy    . Tonsillectomy    . Skin cancer removal    . Vocal cord cyst removal    . Nasal sinus surgery    . Right ankle    . Right hand surgery    . Abdominal hysterectomy      with bilateral salpingo-oophorectomy  . Breast biopsy      multiple --all neg  . Lumpectomy right breast  10/11/12    Current Outpatient Prescriptions  Medication Sig Dispense Refill  . albuterol (PROVENTIL HFA;VENTOLIN HFA) 108 (90 BASE) MCG/ACT inhaler Inhale 2 puffs into the lungs every 4 (four) hours as needed.      Marland Kitchen amLODipine (NORVASC) 5 MG tablet       . atenolol (TENORMIN) 50 MG tablet Take 50 mg by mouth daily. 1/2 tablet daily      . B Complex Vitamins (B-COMPLEX/B-12  PO) Take 1 tablet by mouth daily.        . Calcium Carb-Cholecalciferol (CALCIUM 1000 + D PO) Take 2 tablets by mouth daily.        Tery Sanfilippo Calcium (STOOL SOFTENER PO) Take by mouth. As needed       . DULoxetine (CYMBALTA) 60 MG capsule Take 1 capsule (60 mg total) by mouth 2 (two) times daily.  180 capsule  3  . esomeprazole (NEXIUM) 40 MG capsule Take 1 capsule 30 min before breakfast. Lot# [id number] Exp date: 03/2014  30 capsule  0  . furosemide (LASIX) 20 MG tablet Take 1 tablet (20 mg total) by mouth daily.  30 tablet  6  . gabapentin (NEURONTIN) 300 MG capsule 300 mg 3 (three) times daily. 1-2 tabs TID      . ibuprofen (ADVIL,MOTRIN) 200 MG tablet Take 800 mg by mouth every 6 (six) hours as needed. For pain      . imipramine (TOFRANIL) 25 MG tablet Take 1 tablet (25 mg total) by mouth at bedtime.  90 tablet  0  . mometasone (NASONEX) 50 MCG/ACT  nasal spray Place 2 sprays into the nose daily as needed.       . Multiple Vitamin (MULTIVITAMIN) tablet Take 1 tablet by mouth daily.       . polyethylene glycol (MIRALAX / GLYCOLAX) packet Take 17 g by mouth daily.       . Probiotic Product (PA PROBIOTIC COMPLEX) TABS Take 1 tablet by mouth daily.        . rosuvastatin (CRESTOR) 10 MG tablet Take 1 tablet (10 mg total) by mouth daily.  90 tablet  3  . tamoxifen (NOLVADEX) 20 MG tablet Take 1 tablet (20 mg total) by mouth daily.  90 tablet  4  . traMADol (ULTRAM) 50 MG tablet Take 1-2 tablets (50-100 mg total) by mouth every 6 (six) hours as needed for pain.  30 tablet  1  . traZODone (DESYREL) 100 MG tablet 2 po qhs prn sleep  180 tablet  0  . potassium chloride SA (K-DUR,KLOR-CON) 20 MEQ tablet Take 1 tablet (20 mEq total) by mouth daily.  30 tablet  6   No current facility-administered medications for this visit.    Allergies  Allergen Reactions  . Clindamycin Hcl Shortness Of Breath and Rash  . Penicillins Anaphylaxis  . Prednisone Shortness Of Breath  . Codeine Hives, Itching and Other (See Comments)    headache  . Latex Hives  . Pentazocine Lactate   . Pneumococcal Vaccine Polyvalent     REACTION: redness, swelling, and hives at injection site    History   Social History  . Marital Status: Married    Spouse Name: N/A    Number of Children: 3  . Years of Education: N/A   Occupational History  . Retired Char[employer] fundraiser    Social History Main Topics  . Smoking status: Former Smoker -- 0.20 packs/day for 0 years    Types: Cigarettes    Quit date: 05/27/2012  . Smokeless tobacco: Never Used     Comment: 1 pack per week  . Alcohol Use: Yes     Comment: occasional  . Drug Use: No  . Sexually Active: Not on file   Other Topics Concern  . Not on file   Social History Narrative   Exercise-- 2days a week [employer],  Water aerobics, walking     Family History  Problem Relation Age of Onset  . Rectal cancer Mother   .  Cancer Mother      rectal,  breast  . Irritable bowel syndrome Son   . Irritable bowel syndrome Daughter   . Heart disease Other   . Arthritis Other   . Cancer Other     breast cancer 1st degree relative <50, lung  . Diabetes Other     1st degree relative   . Hyperlipidemia Other   . Hypertension Other   . Osteoporosis Other   . Coronary artery disease Other 65  . Cancer Maternal Aunt     breast    Review of Systems:  As stated in the HPI and otherwise negative.   BP 142/77  Pulse 83  Ht 5\' 5"  (1.651 m)  Wt 118 lb (53.524 kg)  BMI 19.64 kg/m2  Physical Examination: General: Well developed, well nourished, NAD HEENT: OP clear, mucus membranes moist SKIN: warm, dry. No rashes. Neuro: No focal deficits Musculoskeletal: Muscle strength 5/5 all ext Psychiatric: Mood and affect normal Neck: No JVD, no carotid bruits, no thyromegaly, no lymphadenopathy. Lungs:Clear bilaterally, no wheezes, rhonci, crackles Cardiovascular: Regular rate and rhythm. No murmurs, gallops or rubs. Abdomen:Soft. Bowel sounds present. Non-tender.  Extremities: No lower extremity edema. Pulses are 2 + in the bilateral DP/PT.  Assessment and Plan:   1. CAD: Stable. She has mild CAD. Will continue current medical therapy.   2. Dyspnea: Likely related to her recent vocal cord procedure. Her weight is slightly up and she reports LE edema at home. Will start Lasix 20 mg po QDaily and Potassium chloride 20 meq Qdaily. Will reassess in 3-4 weeks.

## 2013-03-05 ENCOUNTER — Other Ambulatory Visit (INDEPENDENT_AMBULATORY_CARE_PROVIDER_SITE_OTHER): Payer: Self-pay | Admitting: Surgery

## 2013-03-05 DIAGNOSIS — C50911 Malignant neoplasm of unspecified site of right female breast: Secondary | ICD-10-CM

## 2013-03-10 ENCOUNTER — Telehealth: Payer: Self-pay | Admitting: *Deleted

## 2013-03-10 ENCOUNTER — Ambulatory Visit (HOSPITAL_BASED_OUTPATIENT_CLINIC_OR_DEPARTMENT_OTHER): Payer: Medicare Other | Admitting: Oncology

## 2013-03-10 VITALS — BP 144/81 | HR 88 | Temp 97.7°F | Resp 20 | Ht 65.0 in | Wt 119.0 lb

## 2013-03-10 DIAGNOSIS — D0590 Unspecified type of carcinoma in situ of unspecified breast: Secondary | ICD-10-CM

## 2013-03-10 DIAGNOSIS — Z170 Estrogen receptor positive status [ER+]: Secondary | ICD-10-CM

## 2013-03-10 DIAGNOSIS — C50911 Malignant neoplasm of unspecified site of right female breast: Secondary | ICD-10-CM

## 2013-03-10 NOTE — Progress Notes (Signed)
ID: [name]   DOB: 1946-06-27  MR#: [id number]  WUJ#:[id number]  PCP: Sheri Freud, DO GYN:  SU: [name],  OTHER MD: [name], [name], [name], [name], [name]   HISTORY OF PRESENT ILLNESS: [name] noted a minimal discharge from the right breast early and 2013. She did not think much of this until it became more copious him a and then she brought it to [name] attention and bilateral breast MRI 07/29/2012 showed 2 areas of concern in the right breast. Right breast biopsy x3 was obtained 08/23/2012, and showed ([id number]) atypical ductal hyperplasia partially involving a papillary lesion. Accordingly, on 10/11/2012 the patient underwent right lumpectomy for what proved to be ([id number] ductal carcinoma in situ, low-grade, measuring approximately 3.2 cm, with negative margins (0.15 cm), 100% estrogen receptor positive, 61% progesterone receptor positive.  With his results the patient was referred to radiation oncology and she is currently receiving of radiation treatments under Dr. Basilio [name], scheduled to be completed mid December 2013.  The patient's subsequent history is as detailed below.  INTERVAL HISTORY: [name] returns today for followup of her noninvasive breast cancer. The interval history has been remarkable for a variety of problems, most of which really are not related. 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 She was 9 weeks in a brace. She did complete her radiation treatments without event and felt fine after that, but then started tamoxifen and developed ulcers of joint pains and vaginal dryness problems. She also has significant hot flashes. She persisted in the tamoxifen despite this, except for one week when she couldn't swallow because she had a throat hemangioma removed at [name]. That was presumably benign, but she has not yet seen the final path. Then she has had a headache for 5  weeks. This is being evaluated by her neurologist, [name], and a brain MRI and MRA were unremarkable. Her own feeling is that she has significant disease in her neck from a remote fall 14 years ago and that that may be the cause. She is hoping it will not require surgery. With all that she sleeps poorly, feels moderately tired, occasionally has hypersensitivity in the right breast, still has a little bit of sore throat when swallowing, has a poor appetite, and feels anxious and depressed.   REVIEW OF SYSTEMS: Aside from the problems discussed in the interval history, a detailed review of systems today was noncontributory.  PAST MEDICAL HISTORY: Past Medical History  Diagnosis Date  . GERD (gastroesophageal reflux disease)   . Arthritis   . Asthma   . Hypertension   . Migraine   . History of colonic polyps   . Hypothyroidism   . Bronchitis     chronic  . Irregular heartbeat     Tachyarrythmia  . CAD (coronary artery disease)     mild non-obstructive cad  . Skin cancer   . Cervical cancer 1991    Tx  F5U  . Cancer of right breast 10/11/12  . Fibromyalgia   . S/P radiation therapy 11/12/12 - 12/05/12    right Breast  . Use of tamoxifen (Nolvadex)     PAST SURGICAL HISTORY: Past Surgical History  Procedure Laterality Date  . Appendectomy    . Tonsillectomy    . Skin cancer removal    . Vocal cord cyst removal    . Nasal sinus surgery    . Right ankle    .  Right hand surgery    . Abdominal hysterectomy      with bilateral salpingo-oophorectomy  . Breast biopsy      multiple --all neg  . Lumpectomy right breast  10/11/12    FAMILY HISTORY Family History  Problem Relation Age of Onset  . Rectal cancer Mother   . Cancer Mother     rectal,  breast  . Irritable bowel syndrome Son   . Irritable bowel syndrome Daughter   . Heart disease Other   . Arthritis Other   . Cancer Other     breast cancer 1st degree relative <50, lung  . Diabetes Other     1st degree  relative   . Hyperlipidemia Other   . Hypertension Other   . Osteoporosis Other   . Coronary artery disease Other 65  . Cancer Maternal Aunt     breast    GYNECOLOGIC HISTORY: Menarche age 62, first live birth age 44, TAH/BSO in 28. She was on hormone replacement between 1982 and 2013, at the time of her breast cancer diagnosis.  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 Her husband [name] used to be IT consultant [employer], now is retired and has helped manage a friend's car lot, something he sees as an location of her ministry. Their children are [name], who is an Art gallery manager with [name] in [name]; [name], who works as an International aid/development worker for [employer] and lives in [name]; and [name], who works as a Chartered loss adjuster (fourth-grade) in [name].   ADVANCED DIRECTIVES: In place  HEALTH MAINTENANCE: History  Substance Use Topics  . Smoking status: Former Smoker -- 0.20 packs/day for 0 years    Types: Cigarettes    Quit date: 05/27/2012  . Smokeless tobacco: Never Used     Comment: 1 pack per week  . Alcohol Use: Yes     Comment: occasional     Colonoscopy: 09/29/2010 [name])  PAP: JAN 2013 ([name])  Bone density: 04/17/2011/ osteoporosis  Lipid panel:  Allergies  Allergen Reactions  . Clindamycin Hcl Shortness Of Breath and Rash  . Penicillins Anaphylaxis  . Prednisone Shortness Of Breath  . Codeine Hives, Itching and Other (See Comments)    headache  . Latex Hives  . Pentazocine Lactate   . Pneumococcal Vaccine Polyvalent     REACTION: redness, swelling, and hives at injection site    Current Outpatient Prescriptions  Medication Sig Dispense Refill  . albuterol (PROVENTIL HFA;VENTOLIN HFA) 108 (90 BASE) MCG/ACT inhaler Inhale 2 puffs into the lungs every 4 (four) hours as needed.      Marland Kitchen amLODipine (NORVASC) 5 MG tablet       .  atenolol (TENORMIN) 50 MG tablet Take 50 mg by mouth daily. 1/2 tablet daily      . B Complex Vitamins (B-COMPLEX/B-12 PO) Take 1 tablet by mouth daily.        . Calcium Carb-Cholecalciferol (CALCIUM 1000 + D PO) Take 2 tablets by mouth daily.        Tery Sanfilippo Calcium (STOOL SOFTENER PO) Take by mouth. As needed       . DULoxetine (CYMBALTA) 60 MG capsule Take 1 capsule (60 mg total) by mouth 2 (two) times daily.  180 capsule  3  . esomeprazole (NEXIUM) 40 MG capsule Take 1 capsule 30 min before breakfast. Lot# [id number] Exp date: 03/2014  30  capsule  0  . furosemide (LASIX) 20 MG tablet Take 1 tablet (20 mg total) by mouth daily.  30 tablet  6  . gabapentin (NEURONTIN) 300 MG capsule 300 mg 3 (three) times daily. 1-2 tabs TID      . ibuprofen (ADVIL,MOTRIN) 200 MG tablet Take 800 mg by mouth every 6 (six) hours as needed. For pain      . imipramine (TOFRANIL) 25 MG tablet Take 1 tablet (25 mg total) by mouth at bedtime.  90 tablet  0  . mometasone (NASONEX) 50 MCG/ACT nasal spray Place 2 sprays into the nose daily as needed.       . Multiple Vitamin (MULTIVITAMIN) tablet Take 1 tablet by mouth daily.       . polyethylene glycol (MIRALAX / GLYCOLAX) packet Take 17 g by mouth daily.       . potassium chloride SA (K-DUR,KLOR-CON) 20 MEQ tablet Take 1 tablet (20 mEq total) by mouth daily.  30 tablet  6  . Probiotic Product (PA PROBIOTIC COMPLEX) TABS Take 1 tablet by mouth daily.        . rosuvastatin (CRESTOR) 10 MG tablet Take 1 tablet (10 mg total) by mouth daily.  90 tablet  3  . tamoxifen (NOLVADEX) 20 MG tablet Take 1 tablet (20 mg total) by mouth daily.  90 tablet  4  . traMADol (ULTRAM) 50 MG tablet Take 1-2 tablets (50-100 mg total) by mouth every 6 (six) hours as needed for pain.  30 tablet  1  . traZODone (DESYREL) 100 MG tablet 2 po qhs prn sleep  180 tablet  0   No current facility-administered medications for this visit.    OBJECTIVE: middle-aged white woman in no acute distress   Filed Vitals:   03/10/13 1445  BP: 144/81  Pulse: 88  Temp: 97.7 F (36.5 C)  Resp: 20     Body mass index is 19.8 kg/(m^2).    ECOG FS: 0  Sclerae unicteric Oropharynx clear No cervical or supraclavicular adenopathy Lungs no rales or rhonchi Heart regular rate and rhythm Abd benign MSK no focal spinal tenderness, no peripheral edema Neuro: nonfocal, well oriented, appropriate affect Breasts: The right breast is status post lumpectomy; the cosmetic result is excellent. There is some hypersensitivity laterally and inferiorly, but no palpable masses erythema or swelling. The right axilla is benign. The left breast is unremarkable.   LAB RESULTS: Lab Results  Component Value Date   WBC 5.3 02/13/2013   NEUTROABS 3.4 02/13/2013   HGB 11.6* 02/13/2013   HCT 34.5* 02/13/2013   MCV 89.7 02/13/2013   PLT 192.0 02/13/2013      Chemistry      Component Value Date/Time   NA 140 02/13/2013 1642   NA 141 11/12/2012 1549   K 3.4* 02/13/2013 1642   K 3.3* 11/12/2012 1549   CL 103 02/13/2013 1642   CL 103 11/12/2012 1549   CO2 31 02/13/2013 1642   CO2 30* 11/12/2012 1549   BUN 17 02/13/2013 1642   BUN 17.0 11/12/2012 1549   CREATININE 1.0 02/13/2013 1642   CREATININE 1.0 11/12/2012 1549      Component Value Date/Time   CALCIUM 8.9 02/13/2013 1642   CALCIUM 9.1 11/12/2012 1549   ALKPHOS 75 02/13/2013 1642   ALKPHOS 90 11/12/2012 1549   AST 23 02/13/2013 1642   AST 22 11/12/2012 1549   ALT 16 02/13/2013 1642   ALT 16 11/12/2012 1549   BILITOT 0.4 02/13/2013 1642  BILITOT 0.24 11/12/2012 1549       No results found for this basename: LABCA2    No components found with this basename: [id number]    No results found for this basename: INR,  in the last 168 hours  Urinalysis    Component Value Date/Time   COLORURINE LT. YELLOW 07/19/2011 1457   APPEARANCEUR CLEAR 07/19/2011 1457   LABSPEC 1.015 07/19/2011 1457   PHURINE 6.5 07/19/2011 1457   HGBUR NEGATIVE 07/19/2011 1457    BILIRUBINUR Neg 02/17/2013 1351   BILIRUBINUR NEGATIVE 07/19/2011 1457   KETONESUR NEGATIVE 07/19/2011 1457   UROBILINOGEN 0.2 02/17/2013 1351   UROBILINOGEN 0.2 07/19/2011 1457   NITRITE Neg 02/17/2013 1351   NITRITE NEGATIVE 07/19/2011 1457   LEUKOCYTESUR Negative 02/17/2013 1351    STUDIES: No results found.  ASSESSMENT: 67 y.o. [name] woman s/p Right lumpectomy 10/11/2012 for low-grade ductal carcinoma in situ, estrogen and progesterone receptor positive, with negative though close margins  (1) radiation therapy completed 12/05/2012  (2) tamoxifen started at the completion of radiation, tolerated poorly, discontinued 03/10/2013  PLAN: We reviewed her situation in detail. She understands her disease was not life-threatening, and that she is taking the tamoxifen more as a preventive then as a treatment. If this were to go on only for a few months it would be acceptable but not for 5 years with all the symptoms she is having  Accordingly we are stopping the tamoxifen at this point. I expect her joint symptoms and hot flash will improve over the next several weeks. She is going to see her shortly after her next mammogram, 6 months or so from now, and we will start seeing her on a yearly basis thereafter. She knows to call for any problems that may develop before that visit.   MAGRINAT,GUSTAV C    03/10/2013

## 2013-03-10 NOTE — Telephone Encounter (Signed)
appts made and printed 

## 2013-03-18 ENCOUNTER — Other Ambulatory Visit (INDEPENDENT_AMBULATORY_CARE_PROVIDER_SITE_OTHER): Payer: Self-pay | Admitting: Surgery

## 2013-03-18 ENCOUNTER — Ambulatory Visit
Admission: RE | Admit: 2013-03-18 | Discharge: 2013-03-18 | Disposition: A | Discharge status: Home / Self Care | Payer: Medicare Other | Source: Ambulatory Visit | Attending: Surgery | Admitting: Surgery

## 2013-03-18 DIAGNOSIS — R928 Other abnormal and inconclusive findings on diagnostic imaging of breast: Secondary | ICD-10-CM

## 2013-03-18 DIAGNOSIS — C50911 Malignant neoplasm of unspecified site of right female breast: Secondary | ICD-10-CM

## 2013-03-18 MED ORDER — GADOBENATE DIMEGLUMINE 529 MG/ML IV SOLN
10.0000 mL | Freq: Once | INTRAVENOUS | Status: AC | PRN
  Administered 2013-03-18: 10 mL via INTRAVENOUS

## 2013-03-20 ENCOUNTER — Ambulatory Visit
Admission: RE | Admit: 2013-03-20 | Discharge: 2013-03-20 | Disposition: A | Discharge status: Home / Self Care | Payer: Medicare Other | Source: Ambulatory Visit | Attending: Surgery | Admitting: Surgery

## 2013-03-20 DIAGNOSIS — R928 Other abnormal and inconclusive findings on diagnostic imaging of breast: Secondary | ICD-10-CM

## 2013-03-27 ENCOUNTER — Encounter: Payer: Self-pay | Admitting: Cardiovascular Disease

## 2013-03-27 ENCOUNTER — Ambulatory Visit (INDEPENDENT_AMBULATORY_CARE_PROVIDER_SITE_OTHER): Payer: Medicare Other | Admitting: Cardiovascular Disease

## 2013-03-27 VITALS — BP 122/86 | HR 77 | Ht 65.0 in | Wt 122.0 lb

## 2013-03-27 DIAGNOSIS — I2510 Atherosclerotic heart disease of native coronary artery without angina pectoris: Secondary | ICD-10-CM

## 2013-03-27 NOTE — Progress Notes (Signed)
History of Present Illness: [name] is a 67 y.o. female who is here today for cardiac follow up. She has a history of non-obstructive CAD, COPD, hypothyroidism, GERD and hypertension. Left heart catheterization 07/2010: pLAD 40%, oLAD 30%, mLAD 30%, pRCA 30%, EF 60%. Last echo 10/2010: EF 55-60%, mild MR. She was admitted 6/10-6/13/13 secondary to acute respiratory failure in the setting of COPD exacerbation. The patient had hypotension and bradycardia and her atenolol was held. She followed up with Tereso Newcomer, PA-C in June 2013. Apparently, there was a question of volume overload during her admission. Her chest x-ray in the hospital was clear of edema. Her BNP was mildly elevated. She c/o chest pain at the f/u visit in June 2013 so stress myoview was arranged. There was no ischemia. LVEF was 68%. There was question of anterior wall defect without ischemia. Echo was arranged to assess wall motion and this was normal. No RWMA. LVEF normal. She was found to have right breast cancer in August and she had a lumpectomy and 5 weeks of radiation which was completed in December 2013. She has had a mass removed from her vocal cords February 2014. She also fell and broke her foot around Christmas. I last saw her 02/25/13 and she c/o feeling depressed with mild weight gain of 5 lbs over last 9 months with some lower extremity edema. I started her on Lasix and potassium. Her Tamoxifen has been stopped.   She is here for cardiac follow up. She is feeling great. No chest pain or SOB.   Primary Care Physician: Loreen Freud   Last Lipid Profile:Lipid Panel     Component Value Date/Time   CHOL 161 09/02/2012 1018   TRIG 128.0 09/02/2012 1018   HDL 61.70 09/02/2012 1018   CHOLHDL 3 09/02/2012 1018   VLDL 25.6 09/02/2012 1018   LDLCALC 74 09/02/2012 1018     Past Medical History  Diagnosis Date  . GERD (gastroesophageal reflux disease)   . Arthritis   . Asthma   . Hypertension   . Migraine   . History of  colonic polyps   . Hypothyroidism   . Bronchitis     chronic  . Irregular heartbeat     Tachyarrythmia  . CAD (coronary artery disease)     mild non-obstructive cad  . Skin cancer   . Cervical cancer 1991    Tx  F5U  . Cancer of right breast 10/11/12  . Fibromyalgia   . S/P radiation therapy 11/12/12 - 12/05/12    right Breast  . Use of tamoxifen (Nolvadex)     Past Surgical History  Procedure Laterality Date  . Appendectomy    . Tonsillectomy    . Skin cancer removal    . Vocal cord cyst removal    . Nasal sinus surgery    . Right ankle    . Right hand surgery    . Abdominal hysterectomy      with bilateral salpingo-oophorectomy  . Breast biopsy      multiple --all neg  . Lumpectomy right breast  10/11/12    Current Outpatient Prescriptions  Medication Sig Dispense Refill  . albuterol (PROVENTIL HFA;VENTOLIN HFA) 108 (90 BASE) MCG/ACT inhaler Inhale 2 puffs into the lungs every 4 (four) hours as needed.      Marland Kitchen amLODipine (NORVASC) 5 MG tablet       . atenolol (TENORMIN) 50 MG tablet Take 50 mg by mouth daily. 1/2 tablet daily      .  Calcium Carb-Cholecalciferol (CALCIUM 1000 + D PO) Take 2 tablets by mouth daily.        Tery Sanfilippo Calcium (STOOL SOFTENER PO) Take by mouth. As needed       . DULoxetine (CYMBALTA) 60 MG capsule Take 1 capsule (60 mg total) by mouth 2 (two) times daily.  180 capsule  3  . esomeprazole (NEXIUM) 40 MG capsule Take 1 capsule 30 min before breakfast. Lot# [id number] Exp date: 03/2014  30 capsule  0  . furosemide (LASIX) 20 MG tablet Take 1 tablet (20 mg total) by mouth daily.  30 tablet  6  . gabapentin (NEURONTIN) 300 MG capsule 300 mg 3 (three) times daily. 1-2 tabs TID      . ibuprofen (ADVIL,MOTRIN) 200 MG tablet Take 800 mg by mouth every 6 (six) hours as needed. For pain      . imipramine (TOFRANIL) 25 MG tablet Take 1 tablet (25 mg total) by mouth at bedtime.  90 tablet  0  . mometasone (NASONEX) 50 MCG/ACT nasal spray Place 2 sprays  into the nose daily as needed.       . Multiple Vitamin (MULTIVITAMIN) tablet Take 1 tablet by mouth daily.       . polyethylene glycol (MIRALAX / GLYCOLAX) packet Take 17 g by mouth daily.       . potassium chloride SA (K-DUR,KLOR-CON) 20 MEQ tablet Take 1 tablet (20 mEq total) by mouth daily.  30 tablet  6  . Probiotic Product (PA PROBIOTIC COMPLEX) TABS Take 1 tablet by mouth daily.        . rosuvastatin (CRESTOR) 10 MG tablet Take 1 tablet (10 mg total) by mouth daily.  90 tablet  3  . traMADol (ULTRAM) 50 MG tablet Take 1-2 tablets (50-100 mg total) by mouth every 6 (six) hours as needed for pain.  30 tablet  1  . traZODone (DESYREL) 100 MG tablet 2 po qhs prn sleep  180 tablet  0   No current facility-administered medications for this visit.    Allergies  Allergen Reactions  . Clindamycin Hcl Shortness Of Breath and Rash  . Penicillins Anaphylaxis  . Prednisone Shortness Of Breath  . Codeine Hives, Itching and Other (See Comments)    headache  . Latex Hives  . Pentazocine Lactate   . Pneumococcal Vaccine Polyvalent     REACTION: redness, swelling, and hives at injection site    History   Social History  . Marital Status: Married    Spouse Name: N/A    Number of Children: 3  . Years of Education: N/A   Occupational History  . Retired Charity fundraiser    Social History Main Topics  . Smoking status: Former Smoker -- 0.20 packs/day for 0 years    Types: Cigarettes    Quit date: 05/27/2012  . Smokeless tobacco: Never Used     Comment: 1 pack per week  . Alcohol Use: Yes     Comment: occasional  . Drug Use: No  . Sexually Active: Not on file   Other Topics Concern  . Not on file   Social History Narrative   Exercise-- 2days a week [employer],  Water aerobics, walking     Family History  Problem Relation Age of Onset  . Rectal cancer Mother   . Cancer Mother     rectal,  breast  . Irritable bowel syndrome Son   . Irritable bowel syndrome Daughter   . Heart disease Other   .  Arthritis  Other   . Cancer Other     breast cancer 1st degree relative <50, lung  . Diabetes Other     1st degree relative   . Hyperlipidemia Other   . Hypertension Other   . Osteoporosis Other   . Coronary artery disease Other 65  . Cancer Maternal Aunt     breast    Review of Systems:  As stated in the HPI and otherwise negative.   BP 122/86  Pulse 77  Ht 5\' 5"  (1.651 m)  Wt 122 lb (55.339 kg)  BMI 20.3 kg/m2  SpO2 93%  Physical Examination: General: Well developed, well nourished, NAD HEENT: OP clear, mucus membranes moist SKIN: warm, dry. No rashes. Neuro: No focal deficits Musculoskeletal: Muscle strength 5/5 all ext Psychiatric: Mood and affect normal Neck: No JVD, no carotid bruits, no thyromegaly, no lymphadenopathy. Lungs:Clear bilaterally, no wheezes, rhonci, crackles Cardiovascular: Regular rate and rhythm. No murmurs, gallops or rubs. Abdomen:Soft. Bowel sounds present. Non-tender.  Extremities: No lower extremity edema. Pulses are 2 + in the bilateral DP/PT.  EKG: NSR, rate 77 bpm. Non-specific ST changes.   Assessment and Plan:   1. CAD: Stable. She has mild CAD. Will continue current medical therapy.   2. Dyspnea: Improved. Likely related to her recent vocal cord procedure. Continue daily Lasix.

## 2013-03-27 NOTE — Patient Instructions (Addendum)
Your physician wants you to follow-up in:  12 months.  You will receive a reminder letter in the mail two months in advance. If you don't receive a letter, please call our office to schedule the follow-up appointment.   

## 2013-04-18 ENCOUNTER — Telehealth: Payer: Self-pay | Admitting: Family Medicine

## 2013-04-18 DIAGNOSIS — G4700 Insomnia, unspecified: Secondary | ICD-10-CM

## 2013-04-18 NOTE — Telephone Encounter (Signed)
Patient also called in about this stating that she will be out of this medication by tomorrow.

## 2013-04-18 NOTE — Telephone Encounter (Signed)
Last seen 02/13/13 and filled 10/11/12 #30 with 1 refill. Please advise     KP

## 2013-04-18 NOTE — Telephone Encounter (Signed)
Refill- trazodone 100mg  tablet. Take two tablets by mouth at bedtime. Qty 60 last fill 2.17.14

## 2013-04-21 ENCOUNTER — Telehealth: Payer: Self-pay | Admitting: General Practice

## 2013-04-21 DIAGNOSIS — G4700 Insomnia, unspecified: Secondary | ICD-10-CM

## 2013-04-21 MED ORDER — TRAZODONE HCL 100 MG PO TABS: ORAL_TABLET | ORAL | Status: DC

## 2013-04-21 NOTE — Telephone Encounter (Signed)
Refill x 6 months 

## 2013-04-21 NOTE — Telephone Encounter (Signed)
Rx faxed.    KP 

## 2013-04-21 NOTE — Telephone Encounter (Signed)
Trazodone refill requested. Pt last seen on 02/13/13 and med last filled on 02/13/13 #180 with 0 refills.

## 2013-04-21 NOTE — Telephone Encounter (Signed)
Tried to call pt to verify which quantity is correct. Original message was for a qty of 180.

## 2013-04-21 NOTE — Telephone Encounter (Signed)
Ok to refill x 1  

## 2013-04-22 MED ORDER — TRAZODONE HCL 100 MG PO TABS: ORAL_TABLET | ORAL | Status: DC

## 2013-04-23 ENCOUNTER — Ambulatory Visit (INDEPENDENT_AMBULATORY_CARE_PROVIDER_SITE_OTHER): Payer: Medicare Other | Admitting: Surgery

## 2013-04-23 ENCOUNTER — Encounter (INDEPENDENT_AMBULATORY_CARE_PROVIDER_SITE_OTHER): Payer: Self-pay | Admitting: Surgery

## 2013-04-23 VITALS — BP 110/64 | HR 76 | Temp 99.1°F | Resp 16 | Ht 65.0 in | Wt 118.8 lb

## 2013-04-23 DIAGNOSIS — C50911 Malignant neoplasm of unspecified site of right female breast: Secondary | ICD-10-CM

## 2013-04-23 DIAGNOSIS — C50919 Malignant neoplasm of unspecified site of unspecified female breast: Secondary | ICD-10-CM

## 2013-04-23 NOTE — Progress Notes (Signed)
Re:   [name] DOB:   22-Mar-1946 MRN:   [id number]  ASSESSMENT AND PLAN: 1.  Right breast cancer, 6 o'clock  (Tis, N0)  DCIS (3.4 cm) on excisional biopsy 10/11/2012.  Anterior margin 0.15 cm.  ER - 100%, PR - 61%  Seeing Dr. Reece Agar. Magrinat and S. Squire.  Completed rad tx.  She tried Tamoxifen, but did not tolerate it.  She will see me back in 6 months.  1a.  She has a lot of pain at her right nipple -that even her husband cannot touch it.  We talked about whether this will resolve or not.   1b.  7 mm nodule in right breast - thought to be benign.  2.  HTN. 3.  GERD   Saw Dr. Arlyce Dice, but she now manages this herself 4.  Depression 5.  Tachyarrhythmia, controlled on Atenolol  Saw Dr. Olene Floss 6.  CAD 7.  COPD/Asthma  Hospitalized in June 2013 for acute bronchitis, now released by Dr. Delton Coombes 8.  Fibromyalgia  Followed by Dr. Hyacinth Meeker in [facility] 9.  Multiple benign skin lesions - Dr. Alinda Sierras 10.  Quit smoking in June 2013. 11. Stopped Premarin - having hot flashes.  Complains of about 10 pounds weight gain.  Chief Complaint  Patient presents with  . Breast Cancer Long Term Follow Up    41mo reck   REFERRING PHYSICIAN: Loreen Freud, DO  HISTORY OF PRESENT ILLNESS: [name] is a 67 y.o. (DOB: [birth year])  white female whose primary care physician is Loreen Freud, DO and comes to me for follow up of a right breast lumpectomy for DCIS.  Her main complaint is tenderness at her right nipple.  Even water hitting it hurts.  I talked that this is common and usually resolves.  But I do have patients with chronic breast pain post surgery/radiation.  She does have the diagnosis of fibromyalgia, which probably makes her a higher risk for this being chronic. There is also a 7 mm mass in the 9 o'clock of the right breast, which appears benign.  This is being followed. He mother, who is 42, is having worsening gastroparesis.  She is diabetic.  Breast History: She noticed a  right nipple discharge which started last October, 2012.  She has had biopsies of both breast (3 on the right and 1 on the left) in the past.  She has 2 paternal aunts who had breast cancer (in their 30's), but no one on her maternal side has had breast cancer.  She had a hysterectomy and bilateral oophorectomy for endometriosis in 1981.  Then in 1991, she had cervical cancer treated by a Dr. Kennon Rounds in [place].  She has had no further problems.  She is on Premarin, which she has been on for years.  We have agreed to stop this until all the biopsies are in.  Mammogram - 07/24/2012 - [facility] - negative, dense breast Mammogram and [place] of breast - 06/12/2012 - [facility] - negative MRI breast - 07/29/2012 - two areas of concern in right breast, 7 o'clock and 9 o'clock Due to the findings on the MRI, she had 3 biopsies of her right breast by Dr. Norris Cross on 08/23/2012.  Two biopsies proved benign, but the biopsy labeled "breast, right, (2B)" showed atypical ductal hyperplasia.  I plan to excise that area of the breast.   Current Outpatient Prescriptions  Medication Sig Dispense Refill  . albuterol (PROVENTIL HFA;VENTOLIN HFA) 108 (90  BASE) MCG/ACT inhaler Inhale 2 puffs into the lungs every 4 (four) hours as needed.      Marland Kitchen amLODipine (NORVASC) 5 MG tablet       . atenolol (TENORMIN) 50 MG tablet Take 50 mg by mouth daily. 1/2 tablet daily      . Calcium Carb-Cholecalciferol (CALCIUM 1000 + D PO) Take 2 tablets by mouth daily.        Tery Sanfilippo Calcium (STOOL SOFTENER PO) Take by mouth. As needed       . DULoxetine (CYMBALTA) 60 MG capsule Take 1 capsule (60 mg total) by mouth 2 (two) times daily.  180 capsule  3  . esomeprazole (NEXIUM) 40 MG capsule Take 1 capsule 30 min before breakfast. Lot# [id number] Exp date: 03/2014  30 capsule  0  . fluticasone (FLONASE) 50 MCG/ACT nasal spray       . furosemide (LASIX) 20 MG tablet Take 1 tablet (20 mg total) by mouth daily.  30 tablet  6  . gabapentin  (NEURONTIN) 300 MG capsule 300 mg 3 (three) times daily. 1-2 tabs TID      . ibuprofen (ADVIL,MOTRIN) 200 MG tablet Take 800 mg by mouth every 6 (six) hours as needed. For pain      . imipramine (TOFRANIL) 25 MG tablet Take 1 tablet (25 mg total) by mouth at bedtime.  90 tablet  0  . mometasone (NASONEX) 50 MCG/ACT nasal spray Place 2 sprays into the nose daily as needed.       . Multiple Vitamin (MULTIVITAMIN) tablet Take 1 tablet by mouth daily.       . polyethylene glycol (MIRALAX / GLYCOLAX) packet Take 17 g by mouth daily.       . potassium chloride SA (K-DUR,KLOR-CON) 20 MEQ tablet Take 1 tablet (20 mEq total) by mouth daily.  30 tablet  6  . Probiotic Product (PA PROBIOTIC COMPLEX) TABS Take 1 tablet by mouth daily.        . rosuvastatin (CRESTOR) 10 MG tablet Take 1 tablet (10 mg total) by mouth daily.  90 tablet  3  . traMADol (ULTRAM) 50 MG tablet Take 1-2 tablets (50-100 mg total) by mouth every 6 (six) hours as needed for pain.  30 tablet  1  . traZODone (DESYREL) 100 MG tablet 2 po qhs prn sleep  180 tablet  1   No current facility-administered medications for this visit.      Allergies  Allergen Reactions  . Clindamycin Hcl Shortness Of Breath and Rash  . Penicillins Anaphylaxis  . Prednisone Shortness Of Breath  . Codeine Hives, Itching and Other (See Comments)    headache  . Latex Hives  . Pentazocine Lactate   . Pneumococcal Vaccine Polyvalent     REACTION: redness, swelling, and hives at injection site    REVIEW OF SYSTEMS: Skin:  Just had a bunch of skin lesions burned by Dr. Terri Piedra. Neurologic:  Headaches Feb 2014 - MRI negative. Cardiac:  HTN.  Had irregular heart rate, but controlled on Atenolol.  Had heart cath in 2011.  Followed by Dr. Sanjuana Kava. Pulmonary:  Hospitalized for bronchitis at [facility] in June 2013.  Quit smoking in June 2013. Gastrointestinal:  GERD.  Had colonoscopy in 2011.  Musculoskeletal:  Fibromyalgia, followed by Dr. Adline Peals. Hyacinth Meeker, neurology in  [facility].  SOCIAL and FAMILY HISTORY: Married. Husband with patient.  I took a cyst off her husband's back in the past. She has 3 children:  34 female, 26 and 27  female  PHYSICAL EXAM: BP 110/64  Pulse 76  Temp(Src) 99.1 F (37.3 C) (Temporal)  Resp 16  Ht 5\' 5"  (1.651 m)  Wt 118 lb 12.8 oz (53.887 kg)  BMI 19.77 kg/m2  General: WN WF who is alert and generally healthy appearing.  HEENT:  Pupils equal.  Dentition good. NECK:  Supple.  No thyroid mass. LYMPH NODES:  No cervical, supraclavicular, or axillary adenopathy. Breasts:  RIGHT - Incision at 6 o'clock looks good.  It is hard to see.  She points to her nipple as tender.   I feel nothing in the lateral aspect of her right breast.  LEFT:  No palpable mass or nodule.  No nipple discharge.   UPPER EXTREMITIES:  No evidence of lymphedema.  DATA REVIEWED: MRI and [place] of both breast - 03/18/2013 - 9 o'clock right breast, 7 mm probable benign mass - follow up mammograms in June 2014  Ovidio Kin, MD,  [facility], [place] [address] [place]  [address]   [place], [place]    [address] Phone:  [phone] FAX:  [phone]

## 2013-04-24 ENCOUNTER — Encounter (INDEPENDENT_AMBULATORY_CARE_PROVIDER_SITE_OTHER): Payer: Self-pay

## 2013-04-29 ENCOUNTER — Telehealth: Payer: Self-pay | Admitting: Family Medicine

## 2013-04-29 NOTE — Telephone Encounter (Signed)
Patient Information:  Caller Name: Chayna  Phone: [phone]  Patient: [name], [name]  Gender: Female  DOB: [birth year]  Age: 67 Years  PCP: Lelon Perla.  Office Follow Up:  Does the office need to follow up with this patient?: Yes  Instructions For The Office: Has another Appt at 3:30 pm today 5/6 so wanting Appt tomorrow 5/7 Please review and contact patient at  [phone].  RN Note:  Has another Appt today 5/6 at 3:30 pm so unable to make Appt at available times of 4 pm today, Wanting Appt tomorrow 5/7.  Symptoms  Reason For Call & Symptoms: [place]at Sunday 5/4 on the way into church, Left foot that had been fractured earlier just gave way, fell and hit right side, hip and shoulder took the blunt of the fall Rib cage on Right behind arm form back to front.  When takes breath in can feel pain in back,  today 5/6 has been the worst.  If more comfortable lying on back, worse on Right side.  Wearing bra hurts, more severe pain if bending over to pick up something.  Worried that might have fracture.  Reviewed Health History In EMR: Yes  Reviewed Medications In EMR: Yes  Reviewed Allergies In EMR: Yes  Reviewed Surgeries / Procedures: Yes  Date of Onset of Symptoms: 04/27/2013  Treatments Tried: Motrin.  Treatments Tried Worked: No  Guideline(s) Used:  Hip Injury  Back Injury  Disposition Per Guideline:   See Today in Office  Reason For Disposition Reached:   Patient wants to be seen  Advice Given:  N/A  Patient Will Follow Care Advice:  YES

## 2013-04-29 NOTE — Telephone Encounter (Signed)
noted 

## 2013-04-29 NOTE — Telephone Encounter (Signed)
C/o fall and having right sided rib pain. Scheduled the patient to see Dr.Paz tomorrow at 11:15 and she is ok with that.      KP

## 2013-04-30 ENCOUNTER — Ambulatory Visit (INDEPENDENT_AMBULATORY_CARE_PROVIDER_SITE_OTHER): Payer: Medicare Other | Admitting: Internal Medicine

## 2013-04-30 VITALS — BP 112/76 | HR 78 | Temp 98.1°F | Wt 118.0 lb

## 2013-04-30 DIAGNOSIS — S20211A Contusion of right front wall of thorax, initial encounter: Secondary | ICD-10-CM

## 2013-04-30 DIAGNOSIS — S20219A Contusion of unspecified front wall of thorax, initial encounter: Secondary | ICD-10-CM | POA: Insufficient documentation

## 2013-04-30 NOTE — Patient Instructions (Addendum)
Please get your x-ray at the other [place]  office located at: [address] [place], across from [facility].  Please go to the basement, this is a walk-in facility, they are open from 8:30 to 5:30 PM. Phone number [phone]. -- Increase tramadol to 3 or 4 times a day temporarily Call if no better in 2 weeks

## 2013-04-30 NOTE — Progress Notes (Signed)
  Subjective:    Patient ID: [name], female    DOB: 12-15-1946, 67 y.o.   MRN: [id number]  HPI Acute visit Status post a mechanical fall 4 days ago, no syncope, landed on her right side. Since then she is hurting in the right shoulder, right hip and  particularly at the right lateral chest wall. She has chronic neck pain and is also slightly worse. Takes Ultram twice a day for fibromyalgia.  Past Medical History  Diagnosis Date  . GERD (gastroesophageal reflux disease)   . Arthritis   . Asthma   . Hypertension   . Migraine   . History of colonic polyps   . Hypothyroidism   . Bronchitis     chronic  . Irregular heartbeat     Tachyarrythmia  . CAD (coronary artery disease)     mild non-obstructive cad  . Skin cancer   . Cervical cancer 1991    Tx  F5U  . Cancer of right breast 10/11/12  . Fibromyalgia   . S/P radiation therapy 11/12/12 - 12/05/12    right Breast  . Use of tamoxifen (Nolvadex)    Past Surgical History  Procedure Laterality Date  . Appendectomy    . Tonsillectomy    . Skin cancer removal    . Vocal cord cyst removal    . Nasal sinus surgery    . Right ankle    . Right hand surgery    . Abdominal hysterectomy      with bilateral salpingo-oophorectomy  . Breast biopsy      multiple --all neg  . Lumpectomy right breast  10/11/12     Review of Systems Denies fever or chills. No cough or shortness or breath. No upper or lower extremity paresthesias. 2 weeks ago to 3.5 hour car trip, denies any pain or swelling in the calves. The only antiplatelet she takes is an aspirin.    Objective:   Physical Exam BP 112/76  Pulse 78  Temp(Src) 98.1 F (36.7 C) (Oral)  Wt 118 lb (53.524 kg)  BMI 19.64 kg/m2  SpO2 98% General -- alert, well-developed, + distress due to pain in the chest whenever she lays down and the table. Neck --Range of motion is normal, not tender to palpation of the cervical spine  Lungs -- normal respiratory effort, no  intercostal retractions, no accessory muscle use, and normal breath sounds.   Chest wall: Quite tender to palpation at the right lateral side, no crepitus Heart-- normal rate, regular rhythm, no murmur, and no gallop.   Abdomen--soft, non-tender, no distention. Extremities-- no pretibial edema bilaterally, calves Symmetric; gait  normal without hip pain  Neurologic-- alert & oriented X3 ; DTRs and strength normal in all extremities. Psych-- Cognition and judgment appear intact. Alert and cooperative with normal attention span and concentration.  not anxious appearing and not depressed appearing.       Assessment & Plan:

## 2013-04-30 NOTE — Assessment & Plan Note (Addendum)
New problem. Status post a fall 4 days ago, hurting in the neck, right shoulder, right hip and chest wall. Given her physical exam I doubt she has a serious neck, shoulder or hip problem. Plan: Increase Ultram intake Ribs x-rays to rule out a fracture Patient aware that even if she has a fracture we'll have to give this more time.

## 2013-05-01 ENCOUNTER — Encounter: Payer: Self-pay | Admitting: Internal Medicine

## 2013-05-01 ENCOUNTER — Ambulatory Visit (INDEPENDENT_AMBULATORY_CARE_PROVIDER_SITE_OTHER)
Admission: RE | Admit: 2013-05-01 | Discharge: 2013-05-01 | Disposition: A | Discharge status: Home / Self Care | Payer: Medicare Other | Source: Ambulatory Visit | Attending: Internal Medicine | Admitting: Internal Medicine

## 2013-05-01 DIAGNOSIS — S20219A Contusion of unspecified front wall of thorax, initial encounter: Secondary | ICD-10-CM

## 2013-05-01 DIAGNOSIS — S20211A Contusion of right front wall of thorax, initial encounter: Secondary | ICD-10-CM

## 2013-05-14 ENCOUNTER — Telehealth: Payer: Self-pay | Admitting: Family Medicine

## 2013-05-14 NOTE — Telephone Encounter (Signed)
Received 3 pages from [employer], sent to DO [employer]. 05/14/13/ss

## 2013-05-20 ENCOUNTER — Telehealth: Payer: Self-pay | Admitting: Cardiovascular Disease

## 2013-05-20 ENCOUNTER — Other Ambulatory Visit: Payer: Self-pay | Admitting: Oncology

## 2013-05-20 DIAGNOSIS — N649 Disorder of breast, unspecified: Secondary | ICD-10-CM

## 2013-05-20 NOTE — Telephone Encounter (Signed)
Pt states she needs Lasix refilled she states there was a change in the dose too 2 tab po qd instead of 1 will route this to his Medical Assist for further review

## 2013-05-20 NOTE — Telephone Encounter (Deleted)
ERROR

## 2013-06-12 ENCOUNTER — Ambulatory Visit
Admission: RE | Admit: 2013-06-12 | Discharge: 2013-06-12 | Disposition: A | Discharge status: Home / Self Care | Payer: Medicare Other | Source: Ambulatory Visit | Attending: Oncology | Admitting: Oncology

## 2013-06-12 DIAGNOSIS — N649 Disorder of breast, unspecified: Secondary | ICD-10-CM

## 2013-06-13 ENCOUNTER — Other Ambulatory Visit: Payer: Medicare Other

## 2013-06-16 ENCOUNTER — Other Ambulatory Visit: Payer: Self-pay | Admitting: Oncology

## 2013-06-16 DIAGNOSIS — N6452 Nipple discharge: Secondary | ICD-10-CM

## 2013-06-24 ENCOUNTER — Other Ambulatory Visit: Payer: Self-pay | Admitting: Oncology

## 2013-06-24 ENCOUNTER — Ambulatory Visit
Admission: RE | Admit: 2013-06-24 | Discharge: 2013-06-24 | Disposition: A | Discharge status: Home / Self Care | Payer: Medicare Other | Source: Ambulatory Visit | Attending: Oncology | Admitting: Oncology

## 2013-06-24 DIAGNOSIS — N6452 Nipple discharge: Secondary | ICD-10-CM

## 2013-07-07 ENCOUNTER — Other Ambulatory Visit: Payer: Self-pay | Admitting: *Deleted

## 2013-07-07 DIAGNOSIS — F329 Major depressive disorder, single episode, unspecified: Secondary | ICD-10-CM

## 2013-07-07 DIAGNOSIS — F32A Depression, unspecified: Secondary | ICD-10-CM

## 2013-07-07 MED ORDER — DULOXETINE HCL 60 MG PO CPEP: 60.0000 mg | ORAL_CAPSULE | Freq: Two times a day (BID) | ORAL | Status: DC

## 2013-07-07 NOTE — Telephone Encounter (Signed)
Refill for cymbalta sent to [facility] in [place]

## 2013-07-09 ENCOUNTER — Telehealth: Payer: Self-pay | Admitting: Family Medicine

## 2013-07-09 NOTE — Telephone Encounter (Signed)
Patient was given 28 day supply. Instructed to schedule a follow up appt with Dr. Laury Axon. Called patient and left samples at the front desk. GF/RN

## 2013-07-09 NOTE — Telephone Encounter (Signed)
Patient called requesting samples of Crestor 10mg . Call pt @ [phone] when ready for pick up.

## 2013-07-10 ENCOUNTER — Encounter: Payer: Self-pay | Admitting: Family Medicine

## 2013-07-10 ENCOUNTER — Ambulatory Visit (INDEPENDENT_AMBULATORY_CARE_PROVIDER_SITE_OTHER): Payer: Medicare Other | Admitting: Family Medicine

## 2013-07-10 VITALS — BP 110/68 | HR 76 | Temp 99.0°F | Wt 119.6 lb

## 2013-07-10 DIAGNOSIS — E785 Hyperlipidemia, unspecified: Secondary | ICD-10-CM

## 2013-07-10 DIAGNOSIS — K219 Gastro-esophageal reflux disease without esophagitis: Secondary | ICD-10-CM

## 2013-07-10 DIAGNOSIS — F411 Generalized anxiety disorder: Secondary | ICD-10-CM

## 2013-07-10 DIAGNOSIS — I2510 Atherosclerotic heart disease of native coronary artery without angina pectoris: Secondary | ICD-10-CM

## 2013-07-10 DIAGNOSIS — R5381 Other malaise: Secondary | ICD-10-CM

## 2013-07-10 DIAGNOSIS — J441 Chronic obstructive pulmonary disease with (acute) exacerbation: Secondary | ICD-10-CM

## 2013-07-10 DIAGNOSIS — N6459 Other signs and symptoms in breast: Secondary | ICD-10-CM

## 2013-07-10 DIAGNOSIS — M791 Myalgia, unspecified site: Secondary | ICD-10-CM

## 2013-07-10 DIAGNOSIS — I10 Essential (primary) hypertension: Secondary | ICD-10-CM

## 2013-07-10 DIAGNOSIS — IMO0001 Reserved for inherently not codable concepts without codable children: Secondary | ICD-10-CM

## 2013-07-10 DIAGNOSIS — N6452 Nipple discharge: Secondary | ICD-10-CM

## 2013-07-10 NOTE — Patient Instructions (Addendum)

## 2013-07-11 LAB — MICROALBUMIN / CREATININE URINE RATIO
Creatinine,U: 97.2 mg/dL
Microalb Creat Ratio: 0.2 mg/g (ref 0.0–30.0)
Microalb, Ur: 0.2 mg/dL (ref 0.0–1.9)

## 2013-07-11 LAB — BASIC METABOLIC PANEL
BUN: 18 mg/dL (ref 6–23)
Creatinine, Ser: 1.2 mg/dL (ref 0.4–1.2)
GFR: 49.37 mL/min — ABNORMAL LOW (ref >60.00)
Glucose, Bld: 67 mg/dL — ABNORMAL LOW (ref 70–99)
Potassium: 3 mEq/L — ABNORMAL LOW (ref 3.5–5.1)

## 2013-07-11 LAB — LIPID PANEL
Cholesterol: 143 mg/dL (ref 0–200)
HDL: 42.9 mg/dL (ref >39.00)
LDL Cholesterol: 73 mg/dL (ref 0–99)
Triglycerides: 138 mg/dL (ref 0.0–149.0)
VLDL: 27.6 mg/dL (ref 0.0–40.0)

## 2013-07-11 LAB — HEPATIC FUNCTION PANEL
AST: 21 U/L (ref 0–37)
Albumin: 4.1 g/dL (ref 3.5–5.2)
Bilirubin, Direct: 0 mg/dL (ref 0.0–0.3)
Total Bilirubin: 0.4 mg/dL (ref 0.3–1.2)

## 2013-07-11 LAB — CBC WITH DIFFERENTIAL/PLATELET
Basophils Absolute: 0 10*3/uL (ref 0.0–0.1)
Eosinophils Absolute: 0.2 10*3/uL (ref 0.0–0.7)
HCT: 35.2 % — ABNORMAL LOW (ref 36.0–46.0)
Lymphs Abs: 1.5 10*3/uL (ref 0.7–4.0)
MCHC: 33.4 g/dL (ref 30.0–36.0)
Monocytes Absolute: 0.4 10*3/uL (ref 0.1–1.0)
Monocytes Relative: 6.1 % (ref 3.0–12.0)
Neutro Abs: 3.8 10*3/uL (ref 1.4–7.7)
Neutrophils Relative %: 65 % (ref 43.0–77.0)
Platelets: 242 10*3/uL (ref 150.0–400.0)
RDW: 15 % — ABNORMAL HIGH (ref 11.5–14.6)
WBC: 5.9 10*3/uL (ref 4.5–10.5)

## 2013-07-11 LAB — TSH: TSH: 1.34 u[IU]/mL (ref 0.35–5.50)

## 2013-07-11 LAB — ANTI-NUCLEAR AB-TITER (ANA TITER)

## 2013-07-11 LAB — RHEUMATOID FACTOR: Rhuematoid fact SerPl-aCnc: <10 IU/mL (ref <=14)

## 2013-07-11 LAB — ANA: Anti Nuclear Antibody(ANA): POSITIVE — AB

## 2013-07-12 ENCOUNTER — Encounter: Payer: Self-pay | Admitting: Family Medicine

## 2013-07-12 DIAGNOSIS — E785 Hyperlipidemia, unspecified: Secondary | ICD-10-CM | POA: Insufficient documentation

## 2013-07-12 NOTE — Assessment & Plan Note (Signed)
Check labs con't meds 

## 2013-07-12 NOTE — Progress Notes (Signed)
Subjective:    Patient here for follow-up of elevated blood pressure.  She is exercising and is adherent to a low-salt diet.  Blood pressure is well controlled at home. Cardiac symptoms: none. Patient denies: chest pain, chest pressure/discomfort, claudication, dyspnea, exertional chest pressure/discomfort, fatigue, irregular heart beat, lower extremity edema, near-syncope, orthopnea, palpitations, paroxysmal nocturnal dyspnea, syncope and tachypnea. Cardiovascular risk factors: advanced age (older than 48 for men, 21 for women) and dyslipidemia. Use of agents associated with hypertension: none. History of target organ damage: none. Pt also c/o muscle aches and joint pains that are worsening.  She has a hx of fibro but this is different and worse than usual.  Past Medical History  Diagnosis Date  . GERD (gastroesophageal reflux disease)   . Arthritis   . Asthma   . Hypertension   . Migraine   . History of colonic polyps   . Hypothyroidism   . Bronchitis     chronic  . Irregular heartbeat     Tachyarrythmia  . CAD (coronary artery disease)     mild non-obstructive cad  . Skin cancer   . Cervical cancer 1991    Tx  F5U  . Cancer of right breast 10/11/12  . Fibromyalgia   . S/P radiation therapy 11/12/12 - 12/05/12    right Breast  . Use of tamoxifen (Nolvadex)    History   Social History  . Marital Status: Married    Spouse Name: N/A    Number of Children: 3  . Years of Education: N/A   Occupational History  . Retired Charity fundraiser    Social History Main Topics  . Smoking status: Former Smoker -- 0.20 packs/day for 0 years    Types: Cigarettes    Quit date: 05/27/2012  . Smokeless tobacco: Never Used     Comment: 1 pack per week  . Alcohol Use: Yes     Comment: occasional  . Drug Use: No  . Sexually Active: Not on file   Other Topics Concern  . Not on file   Social History Narrative   Exercise-- 2days a week [employer],  Water aerobics, walking    Family History  Problem  Relation Age of Onset  . Rectal cancer Mother   . Cancer Mother     rectal,  breast  . Irritable bowel syndrome Son   . Irritable bowel syndrome Daughter   . Heart disease Other   . Arthritis Other   . Cancer Other     breast cancer 1st degree relative <50, lung  . Diabetes Other     1st degree relative   . Hyperlipidemia Other   . Hypertension Other   . Osteoporosis Other   . Coronary artery disease Other 65  . Cancer Maternal Aunt     breast   Current Outpatient Prescriptions on File Prior to Visit  Medication Sig Dispense Refill  . albuterol (PROVENTIL HFA;VENTOLIN HFA) 108 (90 BASE) MCG/ACT inhaler Inhale 2 puffs into the lungs every 4 (four) hours as needed.      Marland Kitchen amLODipine (NORVASC) 5 MG tablet Take 2.5 mg by mouth daily.       . Calcium Carb-Cholecalciferol (CALCIUM 1000 + D PO) Take 2 tablets by mouth daily.        Tery Sanfilippo Calcium (STOOL SOFTENER PO) Take by mouth. As needed       . DULoxetine (CYMBALTA) 60 MG capsule Take 1 capsule (60 mg total) by mouth 2 (two) times daily.  180 capsule  3  . esomeprazole (NEXIUM) 40 MG capsule Take 1 capsule 30 min before breakfast. Lot# [id number] Exp date: 03/2014  30 capsule  0  . fluticasone (FLONASE) 50 MCG/ACT nasal spray       . furosemide (LASIX) 20 MG tablet Take 40 mg by mouth daily.      Marland Kitchen gabapentin (NEURONTIN) 300 MG capsule 300 mg 3 (three) times daily. 1-2 tabs TID      . ibuprofen (ADVIL,MOTRIN) 200 MG tablet Take 800 mg by mouth every 6 (six) hours as needed. For pain      . imipramine (TOFRANIL) 25 MG tablet Take 1 tablet (25 mg total) by mouth at bedtime.  90 tablet  0  . Multiple Vitamin (MULTIVITAMIN) tablet Take 1 tablet by mouth daily.       . polyethylene glycol (MIRALAX / GLYCOLAX) packet Take 17 g by mouth daily.       . Probiotic Product (PA PROBIOTIC COMPLEX) TABS Take 1 tablet by mouth daily.        . rosuvastatin (CRESTOR) 10 MG tablet Take 1 tablet (10 mg total) by mouth daily.  90 tablet  3  .  traMADol (ULTRAM) 50 MG tablet Take 1-2 tablets (50-100 mg total) by mouth every 6 (six) hours as needed for pain.  30 tablet  1  . traZODone (DESYREL) 100 MG tablet 2 po qhs prn sleep  180 tablet  1  . potassium chloride SA (K-DUR,KLOR-CON) 20 MEQ tablet Take 1 tablet (20 mEq total) by mouth daily.  30 tablet  6   No current facility-administered medications on file prior to visit.   Allergies  Allergen Reactions  . Clindamycin Hcl Shortness Of Breath and Rash  . Penicillins Anaphylaxis  . Prednisone Shortness Of Breath  . Codeine Hives, Itching and Other (See Comments)    headache  . Latex Hives  . Pentazocine Lactate   . Pneumococcal Vaccine Polyvalent     REACTION: redness, swelling, and hives at injection site     Review of Systems Pertinent items are noted in HPI.     Objective:    BP 110/68  Pulse 76  Temp(Src) 99 F (37.2 C) (Oral)  Wt 119 lb 9.6 oz (54.25 kg)  BMI 19.9 kg/m2  SpO2 97% General appearance: alert, cooperative, appears stated age and no distress Neck: no adenopathy, no carotid bruit, no JVD, supple, symmetrical, trachea midline and thyroid not enlarged, symmetric, no tenderness/mass/nodules Lungs: clear to auscultation bilaterally Heart: regular rate and rhythm, S1, S2 normal, no murmur, click, rub or gallop Extremities: extremities normal, atraumatic, no cyanosis or edema    Assessment:    Hypertension, normal blood pressure . Evidence of target organ damage: none.    Plan:    Medication: no change. Dietary sodium restriction. Regular aerobic exercise. Follow up: 6 months and as needed.

## 2013-07-12 NOTE — Assessment & Plan Note (Signed)
con't meds  Check labs 

## 2013-07-12 NOTE — Assessment & Plan Note (Signed)
Stable con't meds 

## 2013-07-12 NOTE — Assessment & Plan Note (Signed)
Worsening of muscle aches and joint pains Check labs

## 2013-07-15 LAB — POCT URINALYSIS DIPSTICK
Bilirubin, UA: NEGATIVE
Blood, UA: NEGATIVE
Glucose, UA: NEGATIVE
Leukocytes, UA: NEGATIVE
Nitrite, UA: NEGATIVE

## 2013-07-16 LAB — VITAMIN D 1,25 DIHYDROXY

## 2013-07-28 ENCOUNTER — Telehealth: Payer: Self-pay | Admitting: Family Medicine

## 2013-07-28 NOTE — Telephone Encounter (Signed)
Patient is traveling and has agreed to stop at ED on her way back.        KP

## 2013-07-28 NOTE — Telephone Encounter (Signed)
Caller Name: Jeanice  Phone: [phone]  Patient: Sheri, [name]  Gender: Female  DOB: 1946-12-25  Age: 67 Years  PCP: Lelon Perla.   Does the office need to follow up with this patient?: No  RN Note: traveling, coming home from [place], states will find ED to be seen.   Reason For Call & Symptoms: emergent call regarding red patches on both below the knee both legs, low grade fever, thinks may be blood clots, painful, denies itching  Reviewed Health History In EMR: Yes  Reviewed Medications In EMR: Yes  Reviewed Allergies In EMR: Yes  Reviewed Surgeries / Procedures: Yes  Date of Onset of Symptoms: 07/28/2013  Guideline(s) Used:  Leg Pain  Disposition Per Guideline:  Go to ED Now  Reason For Disposition Reached:  Thigh or calf pain and present > 1 hour  Advice Given:  Call Back If:  You become worse.  Patient Will Follow Care Advice:  YES

## 2013-07-29 ENCOUNTER — Encounter: Payer: Self-pay | Admitting: Family Medicine

## 2013-07-29 ENCOUNTER — Ambulatory Visit (INDEPENDENT_AMBULATORY_CARE_PROVIDER_SITE_OTHER): Payer: Medicare Other | Admitting: Family Medicine

## 2013-07-29 ENCOUNTER — Ambulatory Visit
Admission: RE | Admit: 2013-07-29 | Discharge: 2013-07-29 | Disposition: A | Discharge status: Home / Self Care | Payer: Medicare Other | Source: Ambulatory Visit | Attending: Oncology | Admitting: Oncology

## 2013-07-29 VITALS — BP 120/80 | HR 90 | Temp 98.3°F | Ht 64.0 in | Wt 122.2 lb

## 2013-07-29 DIAGNOSIS — I776 Arteritis, unspecified: Secondary | ICD-10-CM

## 2013-07-29 DIAGNOSIS — R768 Other specified abnormal immunological findings in serum: Secondary | ICD-10-CM

## 2013-07-29 DIAGNOSIS — N6452 Nipple discharge: Secondary | ICD-10-CM

## 2013-07-29 DIAGNOSIS — E119 Type 2 diabetes mellitus without complications: Secondary | ICD-10-CM

## 2013-07-29 DIAGNOSIS — R894 Abnormal immunological findings in specimens from other organs, systems and tissues: Secondary | ICD-10-CM

## 2013-07-29 DIAGNOSIS — N390 Urinary tract infection, site not specified: Secondary | ICD-10-CM

## 2013-07-29 HISTORY — DX: Other specified abnormal immunological findings in serum: R76.8

## 2013-07-29 LAB — SEDIMENTATION RATE: Sed Rate: 24 mm/hr — ABNORMAL HIGH (ref 0–22)

## 2013-07-29 LAB — C-REACTIVE PROTEIN: CRP: <0.5 mg/dL (ref 0.5–20.0)

## 2013-07-29 NOTE — Patient Instructions (Addendum)
We'll notify you of your lab results and get you to the Rheumatologist Because of your codeine allergy, we won't do any hydrocodone- I don't want to make this worse for you Take the Tramadol during the day for the next 2-3 days for pain Hot baths, heating pads Add tylenol as needed for pain relief Call with any questions or concerns Hang in there!!!

## 2013-07-29 NOTE — Progress Notes (Signed)
  Subjective:    Patient ID: [name], female    DOB: 06-Oct-1946, 67 y.o.   MRN: [id number]  HPI Rash- woke up yesterday w/ rash on back of legs.  Was told to go to ER b/c she had redness of legs, pain (chronic problem).  No swelling.  Pt had + ANA but negative titers.  + diffuse pain.  dx'd w/ vasculitis in [place] ER.  Having difficulty w/ joint pain and now having pain and limitation of grip.  Fall- occurred at 4:30am while trying to get the dog.  Slipped down the stairs and went down on bottom, back, neck and head.  Very stiff.  'i'm in so much pain i could die'.   Review of Systems For ROS see HPI     Objective:   Physical Exam  Vitals reviewed. Constitutional: She is oriented to person, place, and time. She appears well-developed and well-nourished. No distress.  HENT:  Head: Normocephalic and atraumatic.  Eyes: EOM are normal. Pupils are equal, round, and reactive to light.  Cardiovascular: Intact distal pulses.   Musculoskeletal: She exhibits tenderness (diffuse tenderness of muscles and joints). She exhibits no edema.  Neurological: She is alert and oriented to person, place, and time.  Skin: Skin is warm and dry. Rash (non blanching rash consistent w/ vasculitis on lower legs bilaterally) noted.  Psychiatric: She has a normal mood and affect. Her behavior is normal.          Assessment & Plan:

## 2013-07-29 NOTE — Assessment & Plan Note (Signed)
New.  Given new dx of vasculitis along w/ + ANA will refer to Rheum to r/o auto-immune process.  Will check preliminary labs to r/o kidney damage or persistent inflammation.  Pt appreciative of plan.  Will follow.

## 2013-07-29 NOTE — Assessment & Plan Note (Signed)
New.  ANA + but titer was negative.  Unclear if this is medication induced or false positive.  Will refer to rheum for complete evaluation.

## 2013-07-30 ENCOUNTER — Other Ambulatory Visit: Payer: Self-pay | Admitting: Emergency Medicine

## 2013-07-30 DIAGNOSIS — C50519 Malignant neoplasm of lower-outer quadrant of unspecified female breast: Secondary | ICD-10-CM

## 2013-07-30 LAB — POCT URINALYSIS DIPSTICK
Blood, UA: NEGATIVE
Glucose, UA: NEGATIVE
Nitrite, UA: NEGATIVE
Protein, UA: NEGATIVE
Urobilinogen, UA: 0.2

## 2013-07-30 LAB — ANCA SCREEN W REFLEX TITER
Atypical p-ANCA Screen: NEGATIVE
c-ANCA Screen: NEGATIVE
p-ANCA Screen: NEGATIVE

## 2013-07-30 NOTE — Addendum Note (Signed)
Addended by: Silvio Pate D on: 07/30/2013 03:01 PM   Modules accepted: Orders

## 2013-08-01 LAB — URINE CULTURE: Organism ID, Bacteria: NO GROWTH

## 2013-08-05 ENCOUNTER — Other Ambulatory Visit: Payer: Medicare Other

## 2013-08-08 ENCOUNTER — Ambulatory Visit
Admission: RE | Admit: 2013-08-08 | Discharge: 2013-08-08 | Disposition: A | Discharge status: Home / Self Care | Payer: Medicare Other | Source: Ambulatory Visit | Attending: Oncology | Admitting: Oncology

## 2013-08-08 DIAGNOSIS — C50519 Malignant neoplasm of lower-outer quadrant of unspecified female breast: Secondary | ICD-10-CM

## 2013-08-08 MED ORDER — GADOBENATE DIMEGLUMINE 529 MG/ML IV SOLN
10.0000 mL | Freq: Once | INTRAVENOUS | Status: AC | PRN
  Administered 2013-08-08: 10 mL via INTRAVENOUS

## 2013-08-12 ENCOUNTER — Other Ambulatory Visit: Payer: Self-pay | Admitting: Emergency Medicine

## 2013-08-12 DIAGNOSIS — C50519 Malignant neoplasm of lower-outer quadrant of unspecified female breast: Secondary | ICD-10-CM

## 2013-08-12 DIAGNOSIS — C50911 Malignant neoplasm of unspecified site of right female breast: Secondary | ICD-10-CM

## 2013-08-13 ENCOUNTER — Other Ambulatory Visit: Payer: Self-pay | Admitting: Emergency Medicine

## 2013-08-13 DIAGNOSIS — C50911 Malignant neoplasm of unspecified site of right female breast: Secondary | ICD-10-CM

## 2013-08-13 DIAGNOSIS — C50519 Malignant neoplasm of lower-outer quadrant of unspecified female breast: Secondary | ICD-10-CM

## 2013-08-14 ENCOUNTER — Other Ambulatory Visit: Payer: Medicare Other

## 2013-08-18 ENCOUNTER — Ambulatory Visit (INDEPENDENT_AMBULATORY_CARE_PROVIDER_SITE_OTHER): Payer: Medicare Other | Admitting: Family Medicine

## 2013-08-18 ENCOUNTER — Encounter: Payer: Self-pay | Admitting: Family Medicine

## 2013-08-18 VITALS — BP 108/66 | HR 88 | Temp 98.3°F | Wt 120.0 lb

## 2013-08-18 DIAGNOSIS — K5900 Constipation, unspecified: Secondary | ICD-10-CM

## 2013-08-18 DIAGNOSIS — C50519 Malignant neoplasm of lower-outer quadrant of unspecified female breast: Secondary | ICD-10-CM

## 2013-08-18 DIAGNOSIS — M2550 Pain in unspecified joint: Secondary | ICD-10-CM

## 2013-08-18 DIAGNOSIS — I776 Arteritis, unspecified: Secondary | ICD-10-CM

## 2013-08-18 HISTORY — DX: Pain in unspecified joint: M25.50

## 2013-08-18 MED ORDER — PREDNISONE 10 MG PO TABS: ORAL_TABLET | ORAL | Status: DC

## 2013-08-18 NOTE — Assessment & Plan Note (Signed)
F/u oncologist

## 2013-08-18 NOTE — Assessment & Plan Note (Signed)
resolved 

## 2013-08-18 NOTE — Assessment & Plan Note (Signed)
linzess daily F/u GI

## 2013-08-18 NOTE — Progress Notes (Signed)
  Subjective:    Patient ID: [name], female    DOB: [birth year], 67 y.o.   MRN: [id number]  HPIPt here f/u vasculitis which has resolved.  Pt rheum appointment pending and she is still having a lot of joint, muscle pains.   Stopping the crestor did nothing.   Pt also c//o constipation with abd bloating. She is unable to have a normal BM without laxative.  No blood in stools.   She also wanted to get some advice on her breast d/c .  She is disappointed and feels like things are happening slowly. She has another bx scheduled for this week.  She thinks she just wants a mastectomy.  I told her to discuss this with her oncologist and team when she saw him.   Review of Systems As above    Objective:   Physical Exam  BP 108/66  Pulse 88  Temp(Src) 98.3 F (36.8 C) (Oral)  Wt 120 lb (54.432 kg)  BMI 20.59 kg/m2 General appearance: alert, cooperative, appears stated age and no distress Lungs: clear to auscultation bilaterally Heart: regular rate and rhythm, S1, S2 normal, no murmur, click, rub or gallop Abdomen: soft, non-tender; bowel sounds normal; no masses,  no organomegaly Extremities: extremities normal, atraumatic, no cyanosis or edema       Assessment & Plan:

## 2013-08-18 NOTE — Patient Instructions (Addendum)

## 2013-08-18 NOTE — Assessment & Plan Note (Signed)
Rheum pending Trial pred pack

## 2013-08-20 ENCOUNTER — Encounter: Payer: Self-pay | Admitting: Family Medicine

## 2013-08-21 ENCOUNTER — Ambulatory Visit
Admission: RE | Admit: 2013-08-21 | Discharge: 2013-08-21 | Disposition: A | Discharge status: Home / Self Care | Payer: Medicare Other | Source: Ambulatory Visit | Attending: Oncology | Admitting: Oncology

## 2013-08-21 DIAGNOSIS — C50911 Malignant neoplasm of unspecified site of right female breast: Secondary | ICD-10-CM

## 2013-08-21 DIAGNOSIS — C50519 Malignant neoplasm of lower-outer quadrant of unspecified female breast: Secondary | ICD-10-CM

## 2013-08-22 ENCOUNTER — Other Ambulatory Visit: Payer: Medicare Other

## 2013-08-26 ENCOUNTER — Other Ambulatory Visit: Payer: Self-pay | Admitting: Oncology

## 2013-08-26 DIAGNOSIS — R928 Other abnormal and inconclusive findings on diagnostic imaging of breast: Secondary | ICD-10-CM

## 2013-09-01 ENCOUNTER — Telehealth: Payer: Self-pay | Admitting: Family Medicine

## 2013-09-01 NOTE — Telephone Encounter (Signed)
Please advise 

## 2013-09-01 NOTE — Telephone Encounter (Signed)
I believe she has 290 mcg  1 po qd--- that is highest dose We need to get her back into GI

## 2013-09-01 NOTE — Telephone Encounter (Signed)
Patient Information:  Caller Name: Keyia  Phone: [phone]  Patient: [name], [name]  Gender: Female  DOB: 1946/03/19  Age: 67 Years  PCP: Lelon Perla.  Office Follow Up:  Does the office need to follow up with this patient?: Yes  Instructions For The Office: Please advise if can increase Linzess to 2 tabs daily instead of 1.  RN Note:  Wants to know if can taker 2 tablets of Linzess. Denies abdominal pain. Abdomen is more swollen than usual and more swollen than when was in office 8-25. Declines visit. Wants to know if can increase medicine before coming back to office.  Symptoms  Reason For Call & Symptoms: Was seen in office "2 weeks ago" due to chronic constipation and prescribed Linzess. Has not had results since second day of taking this medicine.  Reviewed Health History In EMR: Yes  Reviewed Medications In EMR: Yes  Reviewed Allergies In EMR: Yes  Reviewed Surgeries / Procedures: Yes  Date of Onset of Symptoms: 08/20/2013  Treatments Tried: stool softener Miralax Linzess  Treatments Tried Worked: No  Guideline(s) Used:  Constipation  Disposition Per Guideline:   See Today in Office  Reason For Disposition Reached:   Abdomen is more swollen than usual  Advice Given:  N/A  Patient Refused Recommendation:  Patient Requests Prescription  Wants to know if can increase linzess to 2 tabs daily.

## 2013-09-01 NOTE — Telephone Encounter (Signed)
Patient states she was given something for constipation and she still has not had a productive BM. Patient would like to know if she needs another medication or OV. Pt uses [facility] [address] in [place]

## 2013-09-02 NOTE — Telephone Encounter (Signed)
Called pt and LMOVM to return call.  °

## 2013-09-02 NOTE — Telephone Encounter (Signed)
Pt notified NOT to take two Linzess. Pt states that she already did this morning. Pt advised to contact GI ASAP sue to her inability to use restroom. Pt last seen Dr. Arlyce Dice 09-05-2011.

## 2013-09-04 ENCOUNTER — Other Ambulatory Visit: Payer: Self-pay | Admitting: Oncology

## 2013-09-04 ENCOUNTER — Other Ambulatory Visit (HOSPITAL_COMMUNITY): Payer: Self-pay | Admitting: Diagnostic Radiology

## 2013-09-04 ENCOUNTER — Ambulatory Visit
Admission: RE | Admit: 2013-09-04 | Discharge: 2013-09-04 | Disposition: A | Discharge status: Home / Self Care | Payer: Medicare Other | Source: Ambulatory Visit | Attending: Oncology | Admitting: Oncology

## 2013-09-04 DIAGNOSIS — R928 Other abnormal and inconclusive findings on diagnostic imaging of breast: Secondary | ICD-10-CM

## 2013-09-04 HISTORY — PX: BREAST EXCISIONAL BIOPSY: SUR124

## 2013-09-04 MED ORDER — GADOBENATE DIMEGLUMINE 529 MG/ML IV SOLN
5.0000 mL | Freq: Once | INTRAVENOUS | Status: AC | PRN
  Administered 2013-09-04: 5 mL via INTRAVENOUS

## 2013-09-11 ENCOUNTER — Encounter: Payer: Self-pay | Admitting: Oncology

## 2013-09-12 ENCOUNTER — Telehealth: Payer: Self-pay | Admitting: Oncology

## 2013-09-12 NOTE — Telephone Encounter (Signed)
Sent letter to patient from Dr. Magrinat. °

## 2013-09-20 ENCOUNTER — Other Ambulatory Visit: Payer: Self-pay | Admitting: Oncology

## 2013-10-13 ENCOUNTER — Ambulatory Visit (INDEPENDENT_AMBULATORY_CARE_PROVIDER_SITE_OTHER): Payer: Medicare Other | Admitting: Family Medicine

## 2013-10-13 ENCOUNTER — Encounter: Payer: Self-pay | Admitting: Family Medicine

## 2013-10-13 VITALS — BP 90/60 | HR 85 | Temp 98.0°F | Wt 119.8 lb

## 2013-10-13 DIAGNOSIS — Z0000 Encounter for general adult medical examination without abnormal findings: Secondary | ICD-10-CM

## 2013-10-13 DIAGNOSIS — R7989 Other specified abnormal findings of blood chemistry: Secondary | ICD-10-CM

## 2013-10-13 DIAGNOSIS — R300 Dysuria: Secondary | ICD-10-CM

## 2013-10-13 DIAGNOSIS — J45909 Unspecified asthma, uncomplicated: Secondary | ICD-10-CM

## 2013-10-13 DIAGNOSIS — R519 Headache, unspecified: Secondary | ICD-10-CM | POA: Insufficient documentation

## 2013-10-13 DIAGNOSIS — Z23 Encounter for immunization: Secondary | ICD-10-CM

## 2013-10-13 DIAGNOSIS — R51 Headache: Secondary | ICD-10-CM

## 2013-10-13 DIAGNOSIS — K5909 Other constipation: Secondary | ICD-10-CM

## 2013-10-13 DIAGNOSIS — I10 Essential (primary) hypertension: Secondary | ICD-10-CM

## 2013-10-13 DIAGNOSIS — E785 Hyperlipidemia, unspecified: Secondary | ICD-10-CM

## 2013-10-13 DIAGNOSIS — K5904 Chronic idiopathic constipation: Secondary | ICD-10-CM

## 2013-10-13 DIAGNOSIS — R799 Abnormal finding of blood chemistry, unspecified: Secondary | ICD-10-CM

## 2013-10-13 HISTORY — DX: Headache, unspecified: R51.9

## 2013-10-13 LAB — POCT URINALYSIS DIPSTICK
Leukocytes, UA: NEGATIVE
Nitrite, UA: NEGATIVE
Protein, UA: NEGATIVE
Urobilinogen, UA: NEGATIVE
pH, UA: 7.5

## 2013-10-13 MED ORDER — ZOSTER VACCINE LIVE 19400 UNT/0.65ML ~~LOC~~ SOLR: 0.6500 mL | Freq: Once | SUBCUTANEOUS | Status: DC

## 2013-10-13 MED ORDER — CYCLOBENZAPRINE HCL 10 MG PO TABS: 10.0000 mg | ORAL_TABLET | Freq: Three times a day (TID) | ORAL | Status: DC | PRN

## 2013-10-13 MED ORDER — ALBUTEROL SULFATE HFA 108 (90 BASE) MCG/ACT IN AERS: 2.0000 | INHALATION_SPRAY | RESPIRATORY_TRACT | Status: DC | PRN

## 2013-10-13 MED ORDER — KETOROLAC TROMETHAMINE 60 MG/2ML IM SOLN
60.0000 mg | Freq: Once | INTRAMUSCULAR | Status: AC
  Administered 2013-10-13: 60 mg via INTRAMUSCULAR

## 2013-10-13 NOTE — Patient Instructions (Signed)

## 2013-10-13 NOTE — Assessment & Plan Note (Signed)
Stable con't meds 

## 2013-10-13 NOTE — Assessment & Plan Note (Signed)
Check labs 

## 2013-10-13 NOTE — Assessment & Plan Note (Signed)
F/u with GI Pt still only having occassional pencil -like stools

## 2013-10-13 NOTE — Assessment & Plan Note (Signed)
Probably secondary to stress + muscle spasms---rx flexeril Massage rto prn

## 2013-10-13 NOTE — Assessment & Plan Note (Signed)
Refill inhaler stable 

## 2013-10-13 NOTE — Progress Notes (Signed)
  Subjective:    Patient ID: [name], female    DOB: [birth year], 67 y.o.   MRN: [id number]  HPI Pt here with multiple complaints.  She has had urinary frequency but wonders if it is from  Being so constipated.  She has an appointment with GI next month.   She also is here for f/ucholesterol and would like her flu shot.   Pt also c/o headache-- base of skull and knots in shoulder and neck muscles.  Pt mother died recently and she has had other stress as well.      Review of Systems    as above Objective:   Physical Exam BP 90/60  Pulse 85  Temp(Src) 98 F (36.7 C) (Oral)  Wt 119 lb 12.8 oz (54.341 kg)  BMI 20.55 kg/m2  SpO2 97% General appearance: alert, cooperative, appears stated age and no distress Throat: lips, mucosa, and tongue normal; teeth and gums normal Neck: no adenopathy, supple, symmetrical, trachea midline and thyroid not enlarged, symmetric, no tenderness/mass/nodules Lungs: clear to auscultation bilaterally Heart: S1, S2 normal Extremities: extremities normal, atraumatic, no cyanosis or edema Neuro-- cn 2-12 intact MS--+ spasms traps b/l                     Assessment & Plan:

## 2013-10-14 ENCOUNTER — Other Ambulatory Visit: Payer: Self-pay | Admitting: General Practice

## 2013-10-14 DIAGNOSIS — I10 Essential (primary) hypertension: Secondary | ICD-10-CM

## 2013-10-14 MED ORDER — ATENOLOL 50 MG PO TABS: 50.0000 mg | ORAL_TABLET | Freq: Every day | ORAL | Status: DC

## 2013-10-14 MED ORDER — FUROSEMIDE 20 MG PO TABS: 40.0000 mg | ORAL_TABLET | Freq: Every day | ORAL | Status: DC

## 2013-10-14 MED ORDER — ROSUVASTATIN CALCIUM 10 MG PO TABS: 10.0000 mg | ORAL_TABLET | Freq: Every day | ORAL | Status: DC

## 2013-10-14 MED ORDER — OMEPRAZOLE 40 MG PO CPDR: 40.0000 mg | DELAYED_RELEASE_CAPSULE | Freq: Every day | ORAL | Status: DC

## 2013-10-14 NOTE — Telephone Encounter (Signed)
Meds filled

## 2013-10-15 ENCOUNTER — Telehealth: Payer: Self-pay | Admitting: Oncology

## 2013-10-15 ENCOUNTER — Encounter (INDEPENDENT_AMBULATORY_CARE_PROVIDER_SITE_OTHER): Payer: Self-pay | Admitting: Surgery

## 2013-10-15 ENCOUNTER — Ambulatory Visit (INDEPENDENT_AMBULATORY_CARE_PROVIDER_SITE_OTHER): Payer: Medicare Other | Admitting: Surgery

## 2013-10-15 VITALS — BP 94/66 | HR 72 | Temp 98.9°F | Resp 15 | Ht 65.0 in | Wt 120.0 lb

## 2013-10-15 DIAGNOSIS — C50911 Malignant neoplasm of unspecified site of right female breast: Secondary | ICD-10-CM

## 2013-10-15 DIAGNOSIS — C50919 Malignant neoplasm of unspecified site of unspecified female breast: Secondary | ICD-10-CM

## 2013-10-15 NOTE — Progress Notes (Signed)
Re:   [name] DOB:   March 18, 1946 MRN:   [id number]  ASSESSMENT AND PLAN: 1.  Right breast cancer, 6 o'clock  (Tis, N0)  DCIS (3.4 cm) on excisional biopsy 10/11/2012.  Anterior margin 0.15 cm.  ER - 100%, PR - 61%  Seeing Dr. Reece Agar. Magrinat and S. Squire.  Completed rad tx.  She tried Tamoxifen, but did not tolerate it.  She is on no anti-estrogen.  She will see me back in 6 months.  1a.  Pain in both breast - R>L.  But the right side is getting better since early after surgery.   1b.  7 mm nodule in right breast - thought to be benign. 1c.  Biopsy of right breast 09/04/2013 - benign treatment changes.  2.  HTN. 3.  GERD   Saw Dr. Arlyce Dice, but she now manages this herself 4.  Depression 5.  Tachyarrhythmia, controlled on Atenolol  Saw Dr. Olene Floss 6.  CAD 7.  COPD/Asthma  Hospitalized in June 2013 for acute bronchitis, now released by Dr. Delton Coombes 8.  Fibromyalgia  Followed by Dr. Hyacinth Meeker in [facility] 9.  Multiple benign skin lesions - Dr. Alinda Sierras 10.  Quit smoking in June 2013. 11.  Plans surgery on her right foot - 11/01/2013.  Chief Complaint  Patient presents with  . Breast Cancer Long Term Follow Up    LTFU 6 mo br recheck   REFERRING PHYSICIAN: Loreen Freud, DO  HISTORY OF PRESENT ILLNESS: [name] is a 67 y.o. (DOB: Mar 08, 1946)  white female whose primary care physician is Loreen Freud, DO and comes to me for follow up of a right breast lumpectomy for DCIS.  She had a biopsy of her right breast on 09/04/2013.  We went over that and I gave her a copy of her path report.  They plan to repeat the mammogram in  3 months.  She has trouble with her breast being tender, but this tenderness is on both sides.  She also occassionally notices a "crust" on her right nipple.  But she sees no nipple discharge. He mother, who is 2, is having worsening gastroparesis.  She is diabetic.  Breast History: She noticed a right nipple discharge which started last October, 2012.   She has had biopsies of both breast (3 on the right and 1 on the left) in the past.  She has 2 paternal aunts who had breast cancer (in their 27's), but no one on her maternal side has had breast cancer.  She had a hysterectomy and bilateral oophorectomy for endometriosis in 1981.  Then in 1991, she had cervical cancer treated by a Dr. Kennon Rounds in [place].  She has had no further problems.  She is on Premarin, which she has been on for years.  We have agreed to stop this until all the biopsies are in.  Mammogram - 07/24/2012 - [facility] - negative, dense breast Mammogram and [place] of breast - 06/12/2012 - [facility] - negative MRI breast - 07/29/2012 - two areas of concern in right breast, 7 o'clock and 9 o'clock Due to the findings on the MRI, she had 3 biopsies of her right breast by Dr. Norris Cross on 08/23/2012.  Two biopsies proved benign, but the biopsy labeled "breast, right, (2B)" showed atypical ductal hyperplasia.  I plan to excise that area of the breast.   Current Outpatient Prescriptions  Medication Sig Dispense Refill  . albuterol (PROVENTIL HFA;VENTOLIN HFA) 108 (90 BASE) MCG/ACT inhaler  Inhale 2 puffs into the lungs every 4 (four) hours as needed.  1 Inhaler  5  . amLODipine (NORVASC) 5 MG tablet Take 2.5 mg by mouth daily.       Marland Kitchen atenolol (TENORMIN) 50 MG tablet Take 1 tablet (50 mg total) by mouth daily.  90 tablet  0  . Calcium Carb-Cholecalciferol (CALCIUM 1000 + D PO) Take 2 tablets by mouth daily.        . cyclobenzaprine (FLEXERIL) 10 MG tablet Take 1 tablet (10 mg total) by mouth 3 (three) times daily as needed for muscle spasms.  30 tablet  0  . Docusate Calcium (STOOL SOFTENER PO) Take by mouth. As needed       . DULoxetine (CYMBALTA) 60 MG capsule Take 1 capsule (60 mg total) by mouth 2 (two) times daily.  180 capsule  3  . fluticasone (FLONASE) 50 MCG/ACT nasal spray       . furosemide (LASIX) 20 MG tablet Take 2 tablets (40 mg total) by mouth daily.  180 tablet  0  .  gabapentin (NEURONTIN) 300 MG capsule 300 mg 3 (three) times daily. 1-2 tabs TID      . ibuprofen (ADVIL,MOTRIN) 200 MG tablet Take 800 mg by mouth every 6 (six) hours as needed. For pain      . imipramine (TOFRANIL) 25 MG tablet Take 1 tablet (25 mg total) by mouth at bedtime.  90 tablet  0  . Multiple Vitamin (MULTIVITAMIN) tablet Take 1 tablet by mouth daily.       Marland Kitchen omeprazole (PRILOSEC) 40 MG capsule Take 1 capsule (40 mg total) by mouth daily.  90 capsule  1  . polyethylene glycol (MIRALAX / GLYCOLAX) packet Take 17 g by mouth daily.       . potassium chloride SA (K-DUR,KLOR-CON) 20 MEQ tablet Take 1 tablet (20 mEq total) by mouth daily.  30 tablet  6  . Probiotic Product (PA PROBIOTIC COMPLEX) TABS Take 1 tablet by mouth daily.        . rosuvastatin (CRESTOR) 10 MG tablet Take 1 tablet (10 mg total) by mouth daily.  90 tablet  0  . traMADol (ULTRAM) 50 MG tablet Take 1-2 tablets (50-100 mg total) by mouth every 6 (six) hours as needed for pain.  30 tablet  1  . traZODone (DESYREL) 100 MG tablet 2 po qhs prn sleep  180 tablet  1  . zoster vaccine live, PF, (ZOSTAVAX) [address] UNT/0.65ML injection Inject 19,400 Units into the skin once.  1 vial  0   No current facility-administered medications for this visit.    Allergies  Allergen Reactions  . Clindamycin Hcl Shortness Of Breath and Rash  . Penicillins Anaphylaxis  . Prednisone Shortness Of Breath  . Codeine Hives, Itching and Other (See Comments)    headache  . Latex Hives  . Pentazocine Lactate   . Pneumococcal Vaccine Polyvalent     REACTION: redness, swelling, and hives at injection site    REVIEW OF SYSTEMS: Skin:  Just had a bunch of skin lesions burned by Dr. Terri Piedra. Neurologic:  Headaches Feb 2014 - MRI negative. Cardiac:  HTN.  Had irregular heart rate, but controlled on Atenolol.  Had heart cath in 2011.  Followed by Dr. Sanjuana Kava. Pulmonary:  Hospitalized for bronchitis at [facility] in June 2013.  Quit smoking in June  2013. Gastrointestinal:  GERD.  Had colonoscopy in 2011.  Musculoskeletal:  Fibromyalgia, followed by Dr. Adline Peals. Hyacinth Meeker, neurology in [facility].  SOCIAL  and FAMILY HISTORY: Married. Husband with patient.  I took a cyst off her husband's back in the past. She has 3 children:  42 female, 77 and 57 female  PHYSICAL EXAM: BP 94/66  Pulse 72  Temp(Src) 98.9 F (37.2 C) (Temporal)  Resp 15  Ht 5\' 5"  (1.651 m)  Wt 120 lb (54.432 kg)  BMI 19.97 kg/m2  General: WN WF who is alert and generally healthy appearing.  HEENT:  Pupils equal.  Dentition good. NECK:  Supple.  No thyroid mass. LYMPH NODES:  No cervical, supraclavicular, or axillary adenopathy. Breasts:  RIGHT - Her circumareolar incision is hard to see.  She has several areas of tenderness, but there is no correlating mass.  LEFT:  No palpable mass or nodule.  No nipple discharge. She has some tenderness, but less than the right side.  UPPER EXTREMITIES:  No evidence of lymphedema.  DATA REVIEWED: Mammogram (and biopsy) 09/04/2013 - benign biopsy consistent with therapy changes.  Ovidio Kin, MD,  [facility] [address] [place]  [address]   [place], [place]    [address] Phone:  [phone] FAX:  [phone]

## 2013-10-15 NOTE — Telephone Encounter (Signed)
pt called to r/s appt...done °

## 2013-10-16 ENCOUNTER — Other Ambulatory Visit: Payer: Medicare Other

## 2013-10-16 ENCOUNTER — Other Ambulatory Visit: Payer: Self-pay | Admitting: Cardiovascular Disease

## 2013-10-20 ENCOUNTER — Ambulatory Visit: Payer: Medicare Other | Admitting: Oncology

## 2013-10-21 ENCOUNTER — Other Ambulatory Visit (INDEPENDENT_AMBULATORY_CARE_PROVIDER_SITE_OTHER): Payer: Medicare Other

## 2013-10-21 ENCOUNTER — Ambulatory Visit: Payer: Medicare Other

## 2013-10-21 DIAGNOSIS — Z79899 Other long term (current) drug therapy: Secondary | ICD-10-CM

## 2013-10-21 DIAGNOSIS — I10 Essential (primary) hypertension: Secondary | ICD-10-CM

## 2013-10-21 DIAGNOSIS — Z23 Encounter for immunization: Secondary | ICD-10-CM

## 2013-10-21 DIAGNOSIS — E785 Hyperlipidemia, unspecified: Secondary | ICD-10-CM

## 2013-10-21 LAB — HEPATIC FUNCTION PANEL
AST: 22 U/L (ref 0–37)
Albumin: 3.9 g/dL (ref 3.5–5.2)
Alkaline Phosphatase: 82 U/L (ref 39–117)
Bilirubin, Direct: 0 mg/dL (ref 0.0–0.3)
Total Protein: 7.1 g/dL (ref 6.0–8.3)

## 2013-10-21 LAB — LIPID PANEL
Cholesterol: 149 mg/dL (ref 0–200)
Total CHOL/HDL Ratio: 3
Triglycerides: 112 mg/dL (ref 0.0–149.0)

## 2013-10-21 LAB — BASIC METABOLIC PANEL
BUN: 16 mg/dL (ref 6–23)
Calcium: 9.3 mg/dL (ref 8.4–10.5)
Creatinine, Ser: 1.1 mg/dL (ref 0.4–1.2)
Glucose, Bld: 83 mg/dL (ref 70–99)
Potassium: 3.1 mEq/L — ABNORMAL LOW (ref 3.5–5.1)

## 2013-10-21 LAB — VITAMIN B12: Vitamin B-12: 413 pg/mL (ref 211–911)

## 2013-10-22 ENCOUNTER — Telehealth: Payer: Self-pay | Admitting: Oncology

## 2013-10-22 ENCOUNTER — Encounter: Payer: Self-pay | Admitting: Nurse Practitioner

## 2013-10-22 ENCOUNTER — Ambulatory Visit (HOSPITAL_BASED_OUTPATIENT_CLINIC_OR_DEPARTMENT_OTHER): Payer: Medicare Other | Admitting: Oncology

## 2013-10-22 ENCOUNTER — Ambulatory Visit (INDEPENDENT_AMBULATORY_CARE_PROVIDER_SITE_OTHER): Payer: Medicare Other | Admitting: Nurse Practitioner

## 2013-10-22 VITALS — BP 122/70 | HR 72 | Ht 65.0 in | Wt 121.2 lb

## 2013-10-22 VITALS — BP 128/79 | HR 76 | Temp 97.6°F | Resp 18 | Ht 65.0 in | Wt 120.6 lb

## 2013-10-22 DIAGNOSIS — IMO0001 Reserved for inherently not codable concepts without codable children: Secondary | ICD-10-CM

## 2013-10-22 DIAGNOSIS — K5900 Constipation, unspecified: Secondary | ICD-10-CM

## 2013-10-22 DIAGNOSIS — Z170 Estrogen receptor positive status [ER+]: Secondary | ICD-10-CM

## 2013-10-22 DIAGNOSIS — R109 Unspecified abdominal pain: Secondary | ICD-10-CM

## 2013-10-22 DIAGNOSIS — D0590 Unspecified type of carcinoma in situ of unspecified breast: Secondary | ICD-10-CM

## 2013-10-22 DIAGNOSIS — C50911 Malignant neoplasm of unspecified site of right female breast: Secondary | ICD-10-CM

## 2013-10-22 MED ORDER — LINACLOTIDE 290 MCG PO CAPS: 290.0000 ug | ORAL_CAPSULE | Freq: Every day | ORAL | Status: DC

## 2013-10-22 NOTE — Patient Instructions (Signed)
You have been given some samples of Linzess.   Call our office in about a week to let us know if this is working for you and if you would like to continue taking it

## 2013-10-22 NOTE — Progress Notes (Signed)
ID: [name]   DOB: [birth year]  MR#: [id number]  AOZ#:[id number]  PCP: Sheri Freud, DO GYN:  SU: [name],  OTHER MD: [name], [name], [name], [name], [name]   HISTORY OF PRESENT ILLNESS: [name] noted a minimal discharge from the right breast early and 2013. She did not think much of this until it became more copious him a and then she brought it to Dr. Estrellita [name] attention and bilateral breast MRI 07/29/2012 showed 2 areas of concern in the right breast. Right breast biopsy x3 was obtained 08/23/2012, and showed ([id number]) atypical ductal hyperplasia partially involving a papillary lesion. Accordingly, on 10/11/2012 the patient underwent right lumpectomy for what proved to be ([id number] ductal carcinoma in situ, low-grade, measuring approximately 3.2 cm, with negative margins (0.15 cm), 100% estrogen receptor positive, 61% progesterone receptor positive.  With his results the patient was referred to radiation oncology and she is currently receiving of radiation treatments under [name], scheduled to be completed mid December 2013.  The patient's subsequent history is as detailed below.  INTERVAL HISTORY: [name] returns today for followup of her noninvasive breast cancer. The interval history is significant for her 68 year old mother having died from pancreatic cancer in [place]. [name] spent the last 3 weeks of her mother's life with her essential in the hospital day and night. She is scarcely a month out from the dense and she is understandably grieving actively.  REVIEW OF SYSTEMS: Her fibromyalgia is acting up and she hurts "all over" the pain being achy and throbbing and constant. She takes gabapentin and tramadol for this, with fair control. She does get constipated but has an appropriate bowel prophylaxis in place. She is now sleeping well. She is very emotional, understandably. She has not yet resumed her normal exercise program,  which was walking twice a week. Otherwise a detailed review of systems today was stable  PAST MEDICAL HISTORY: Past Medical History  Diagnosis Date  . GERD (gastroesophageal reflux disease)   . Arthritis   . Asthma   . Hypertension   . Migraine   . History of colonic polyps   . Hypothyroidism   . Bronchitis     chronic  . Irregular heartbeat     Tachyarrythmia  . CAD (coronary artery disease)     mild non-obstructive cad  . Skin cancer   . Cervical cancer 1991    Tx  F5U  . Cancer of right breast 10/11/12  . Fibromyalgia   . S/P radiation therapy 11/12/12 - 12/05/12    right Breast  . Use of tamoxifen (Nolvadex)     PAST SURGICAL HISTORY: Past Surgical History  Procedure Laterality Date  . Appendectomy    . Tonsillectomy    . Skin cancer removal    . Vocal cord cyst removal    . Nasal sinus surgery    . Right ankle    . Right hand surgery    . Abdominal hysterectomy      with bilateral salpingo-oophorectomy  . Breast biopsy      multiple --all neg  . Lumpectomy right breast  10/11/12    FAMILY HISTORY Family History  Problem Relation Age of Onset  . Rectal cancer Mother   . Cancer Mother     rectal,  breast, pancreatic  . Irritable bowel syndrome Son   . Irritable bowel syndrome Daughter   . Heart disease Other   . Arthritis Other   . Cancer Other  breast cancer 1st degree relative <50, lung  . Diabetes Other     1st degree relative   . Hyperlipidemia Other   . Hypertension Other   . Osteoporosis Other   . Coronary artery disease Other 65  . Cancer Maternal Aunt     breast    GYNECOLOGIC HISTORY: Menarche age 67, first live birth age 17, TAH/BSO in 67. She was on hormone replacement between 1982 and 2013, at the time of her breast cancer diagnosis.  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 Her husband [name] used to be IT consultant for [employer],  now is retired and has helped manage a friend's car lot, something he sees as an location of his ministry. Their children are [name], who is an Art gallery manager with [employer] in [place]; [name], who works as an International aid/development worker for [employer] and lives in [place]; and [name], who works as a Chartered loss adjuster (fourth-grade) in [place].   ADVANCED DIRECTIVES: In place  HEALTH MAINTENANCE: History  Substance Use Topics  . Smoking status: Former Smoker -- 0.20 packs/day for 0 years    Types: Cigarettes    Quit date: 05/27/2012  . Smokeless tobacco: Never Used     Comment: 1 pack per week  . Alcohol Use: Yes     Comment: occasional     Colonoscopy: 09/29/2010 [name])  PAP: JAN 2013 ([name])  Bone density: 04/17/2011/ osteoporosis  Lipid panel:  Allergies  Allergen Reactions  . Clindamycin Hcl Shortness Of Breath and Rash  . Penicillins Anaphylaxis  . Prednisone Shortness Of Breath  . Codeine Hives, Itching and Other (See Comments)    headache  . Latex Hives  . Pentazocine Lactate   . Pneumococcal Vaccine Polyvalent     REACTION: redness, swelling, and hives at injection site    Current Outpatient Prescriptions  Medication Sig Dispense Refill  . albuterol (PROVENTIL HFA;VENTOLIN HFA) 108 (90 BASE) MCG/ACT inhaler Inhale 2 puffs into the lungs every 4 (four) hours as needed.  1 Inhaler  5  . amLODipine (NORVASC) 5 MG tablet Take 2.5 mg by mouth daily.       Marland Kitchen atenolol (TENORMIN) 50 MG tablet Take 1 tablet (50 mg total) by mouth daily.  90 tablet  0  . Calcium Carb-Cholecalciferol (CALCIUM 1000 + D PO) Take 2 tablets by mouth daily.        . cyclobenzaprine (FLEXERIL) 10 MG tablet Take 1 tablet (10 mg total) by mouth 3 (three) times daily as needed for muscle spasms.  30 tablet  0  . Docusate Calcium (STOOL SOFTENER PO) Take by mouth. As needed       . DULoxetine (CYMBALTA) 60 MG capsule Take 1 capsule (60 mg total) by mouth 2 (two) times daily.  180  capsule  3  . fluticasone (FLONASE) 50 MCG/ACT nasal spray       . furosemide (LASIX) 20 MG tablet Take 2 tablets (40 mg total) by mouth daily.  180 tablet  0  . gabapentin (NEURONTIN) 300 MG capsule 300 mg 3 (three) times daily. 1-2 tabs TID      . ibuprofen (ADVIL,MOTRIN) 200 MG tablet Take 800 mg by mouth every 6 (six) hours as needed. For pain      . imipramine (TOFRANIL) 25 MG tablet Take 1 tablet (25 mg total) by mouth at bedtime.  90 tablet  0  .  Multiple Vitamin (MULTIVITAMIN) tablet Take 1 tablet by mouth daily.       Marland Kitchen omeprazole (PRILOSEC) 40 MG capsule Take 1 capsule (40 mg total) by mouth daily.  90 capsule  1  . polyethylene glycol (MIRALAX / GLYCOLAX) packet Take 17 g by mouth daily.       . potassium chloride SA (K-DUR,KLOR-CON) 20 MEQ tablet Take 1 tablet (20 mEq total) by mouth daily.  30 tablet  6  . Probiotic Product (PA PROBIOTIC COMPLEX) TABS Take 1 tablet by mouth daily.        . rosuvastatin (CRESTOR) 10 MG tablet Take 1 tablet (10 mg total) by mouth daily.  90 tablet  0  . traMADol (ULTRAM) 50 MG tablet Take 1-2 tablets (50-100 mg total) by mouth every 6 (six) hours as needed for pain.  30 tablet  1  . traZODone (DESYREL) 100 MG tablet 2 po qhs prn sleep  180 tablet  1  . zoster vaccine live, PF, (ZOSTAVAX) [address] UNT/0.65ML injection Inject 19,400 Units into the skin once.  1 vial  0   No current facility-administered medications for this visit.    OBJECTIVE: middle-aged white woman who appears her stated age 67 Vitals:   10/22/13 1158  BP: 128/79  Pulse: 76  Temp: 97.6 F (36.4 C)  Resp: 18     Body mass index is 20.07 kg/(m^2).    ECOG FS: 1  Sclerae unicteric Oropharynx clear No cervical or supraclavicular adenopathy Lungs no rales or rhonchi Heart regular rate and rhythm Abd soft, nontender, positive bowel sounds MSK diffuse spinal tenderness to mild palpation, no upper extremity lymphedema Neuro: nonfocal, well oriented, appropriate  affect Breasts: The right breast is status post lumpectomy; there is no evidence of local recurrence. The right axilla is benign. The left breast is unremarkable.   LAB RESULTS: Lab Results  Component Value Date   WBC 5.9 07/10/2013   NEUTROABS 3.8 07/10/2013   HGB 11.8* 07/10/2013   HCT 35.2* 07/10/2013   MCV 91.8 07/10/2013   PLT 242.0 07/10/2013      Chemistry      Component Value Date/Time   NA 142 10/21/2013 1236   NA 141 11/12/2012 1549   K 3.1* 10/21/2013 1236   K 3.3* 11/12/2012 1549   CL 101 10/21/2013 1236   CL 103 11/12/2012 1549   CO2 32 10/21/2013 1236   CO2 30* 11/12/2012 1549   BUN 16 10/21/2013 1236   BUN 17.0 11/12/2012 1549   CREATININE 1.1 10/21/2013 1236   CREATININE 1.0 11/12/2012 1549      Component Value Date/Time   CALCIUM 9.3 10/21/2013 1236   CALCIUM 9.1 11/12/2012 1549   ALKPHOS 82 10/21/2013 1236   ALKPHOS 90 11/12/2012 1549   AST 22 10/21/2013 1236   AST 22 11/12/2012 1549   ALT 21 10/21/2013 1236   ALT 16 11/12/2012 1549   BILITOT 0.4 10/21/2013 1236   BILITOT 0.24 11/12/2012 1549       No results found for this basename: LABCA2    No components found with this basename: LABCA125    No results found for this basename: INR,  in the last 168 hours  Urinalysis    Component Value Date/Time   COLORURINE LT. YELLOW 07/19/2011 1457   APPEARANCEUR CLEAR 07/19/2011 1457   LABSPEC 1.015 07/19/2011 1457   PHURINE 6.5 07/19/2011 1457   HGBUR NEGATIVE 07/19/2011 1457   BILIRUBINUR Neg 10/13/2013 1412   BILIRUBINUR NEGATIVE 07/19/2011 1457   KETONESUR  NEGATIVE 07/19/2011 1457   UROBILINOGEN negative 10/13/2013 1412   UROBILINOGEN 0.2 07/19/2011 1457   NITRITE Neg 10/13/2013 1412   NITRITE NEGATIVE 07/19/2011 1457   LEUKOCYTESUR Negative 10/13/2013 1412    STUDIES: Mammography 09/04/2013 was unremarkable   ASSESSMENT: 67 y.o. [name] woman s/p Right lumpectomy 10/11/2012 for low-grade ductal carcinoma in situ, estrogen and progesterone  receptor positive, with negative though close margins  (1) radiation therapy completed 12/05/2012  (2) tamoxifen started at the completion of radiation, tolerated poorly, discontinued 03/10/2013  PLAN: [name] is doing fine from a breast cancer point of view, with no evidence of disease recurrence or progression. As far as that is concerned she is going to start seeing [place] on a once a year basis until she completes her 5 years of followup.  She is grieving appropriately regarding her mother's recent death. I suggested she contact the grieve counseling services provided by her church, and she promised to do this. She is looking forward to having the whole family, for Thanksgiving.   We also discussed her fibromyalgia problems, which is now flaring up. She has the appropriate medicines and did not require new refills. She knows to call us for any problems that may develop before next visit here.   Alanii Ramer C    10/22/2013

## 2013-10-22 NOTE — Telephone Encounter (Signed)
, °

## 2013-10-22 NOTE — Progress Notes (Signed)
HPI :  [name] is a 67 year-old female followed by Dr. Arlyce Dice for history of GERD, chronic constipation and a family history colorectal cancer. Since her last visit here in 2012 patient has been diagnosed with right breast cancer, s/p lumpectomy and radiation (completed January).   Over the last couple of years patient had been doing well on Miralax, fiber and Activia. She hasn't required Miralax on a daily basis. Two months ago she developed recurrent constipation despite adding in senekot. Constipation described as decreased urge and also difficult evacuation. Patient describes need to push upward on one side of anus to evacuate stools. Even with this stools are pencil like in caliber. She has diffuse lower abdominal discomfort worse after meals. No urinary discomfort, normal u/a recently but she is having urinary hesitancy. Also 2-3 months ago she began having "spraying" of urine when she urinate. .    Past Medical History  Diagnosis Date  . GERD (gastroesophageal reflux disease)   . Arthritis   . Asthma   . Hypertension   . Migraine   . History of colonic polyps   . Hypothyroidism   . Bronchitis     chronic  . Irregular heartbeat     Tachyarrythmia  . CAD (coronary artery disease)     mild non-obstructive cad  . Skin cancer   . Cervical cancer 1991    Tx  F5U  . Cancer of right breast 10/11/12  . Fibromyalgia   . S/P radiation therapy 11/12/12 - 12/05/12    right Breast  . Use of tamoxifen (Nolvadex)     Family History  Problem Relation Age of Onset  . Rectal cancer Mother   . Cancer Mother     rectal,  breast, pancreatic  . Irritable bowel syndrome Son   . Irritable bowel syndrome Daughter   . Heart disease Other   . Arthritis Other   . Cancer Other     breast cancer 1st degree relative <50, lung  . Diabetes Other     1st degree relative   . Hyperlipidemia Other   . Hypertension Other   . Osteoporosis Other   . Coronary artery disease Other 65  . Cancer  Maternal Aunt     breast   History  Substance Use Topics  . Smoking status: Former Smoker -- 0.20 packs/day for 0 years    Types: Cigarettes    Quit date: 05/27/2012  . Smokeless tobacco: Never Used     Comment: 1 pack per week  . Alcohol Use: Yes     Comment: occasional   Current Outpatient Prescriptions  Medication Sig Dispense Refill  . albuterol (PROVENTIL HFA;VENTOLIN HFA) 108 (90 BASE) MCG/ACT inhaler Inhale 2 puffs into the lungs every 4 (four) hours as needed.  1 Inhaler  5  . amLODipine (NORVASC) 5 MG tablet Take 2.5 mg by mouth daily.       Marland Kitchen atenolol (TENORMIN) 50 MG tablet Take 1 tablet (50 mg total) by mouth daily.  90 tablet  0  . Calcium Carb-Cholecalciferol (CALCIUM 1000 + D PO) Take 2 tablets by mouth daily.        . cyclobenzaprine (FLEXERIL) 10 MG tablet Take 1 tablet (10 mg total) by mouth 3 (three) times daily as needed for muscle spasms.  30 tablet  0  . Docusate Calcium (STOOL SOFTENER PO) Take by mouth. As needed       . DULoxetine (CYMBALTA) 60 MG capsule Take 1 capsule (60 mg total)  by mouth 2 (two) times daily.  180 capsule  3  . fluticasone (FLONASE) 50 MCG/ACT nasal spray       . furosemide (LASIX) 20 MG tablet Take 2 tablets (40 mg total) by mouth daily.  180 tablet  0  . gabapentin (NEURONTIN) 300 MG capsule 300 mg 3 (three) times daily. 1-2 tabs TID      . ibuprofen (ADVIL,MOTRIN) 200 MG tablet Take 800 mg by mouth every 6 (six) hours as needed. For pain      . imipramine (TOFRANIL) 25 MG tablet Take 1 tablet (25 mg total) by mouth at bedtime.  90 tablet  0  . Multiple Vitamin (MULTIVITAMIN) tablet Take 1 tablet by mouth daily.       Marland Kitchen omeprazole (PRILOSEC) 40 MG capsule Take 1 capsule (40 mg total) by mouth daily.  90 capsule  1  . polyethylene glycol (MIRALAX / GLYCOLAX) packet Take 17 g by mouth daily.       . potassium chloride SA (K-DUR,KLOR-CON) 20 MEQ tablet Take 1 tablet (20 mEq total) by mouth daily.  30 tablet  6  . Probiotic Product (PA  PROBIOTIC COMPLEX) TABS Take 1 tablet by mouth daily.        . rosuvastatin (CRESTOR) 10 MG tablet Take 1 tablet (10 mg total) by mouth daily.  90 tablet  0  . traMADol (ULTRAM) 50 MG tablet Take 1-2 tablets (50-100 mg total) by mouth every 6 (six) hours as needed for pain.  30 tablet  1  . traZODone (DESYREL) 100 MG tablet 2 po qhs prn sleep  180 tablet  1  . zoster vaccine live, PF, (ZOSTAVAX) 16109 UNT/0.65ML injection Inject 19,400 Units into the skin once.  1 vial  0   No current facility-administered medications for this visit.   Allergies  Allergen Reactions  . Clindamycin Hcl Shortness Of Breath and Rash  . Penicillins Anaphylaxis  . Prednisone Shortness Of Breath  . Codeine Hives, Itching and Other (See Comments)    headache  . Latex Hives  . Pentazocine Lactate   . Pneumococcal Vaccine Polyvalent     REACTION: redness, swelling, and hives at injection site     Review of Systems: All systems reviewed and negative except where noted in HPI.   Studies: Colonoscopy October 2011 INDICATIONS: 1) screening 2) family history of colon cancer 3)  history of pre-cancerous (adenomatous) colon polyps Mother  MEDICATIONS: Fentanyl 50 mcg IV, Versed 5 mg IV  DESCRIPTION OF PROCEDURE: After the risks benefits and  alternatives of the procedure were thoroughly explained, informed  consent was obtained. Digital rectal exam was performed and  revealed no abnormalities. The LB 180AL [id number] endoscope was  introduced through the anus and advanced to the cecum, which was  identified by both the appendix and ileocecal valve, without  limitations. The quality of the prep was excellent, using  MiraLax. The instrument was then slowly withdrawn as the colon  was fully examined.  <<PROCEDUREIMAGES>>  FINDINGS: Melanosis coli was found. Moderate melanosis throughout  the colon This was otherwise a normal examination of the colon  ENDOSCOPIC IMPRESSION:  1) Melanosis  RECOMMENDATIONS:  1)  Given your significant family history of colon cancer, you  should have a repeat colonoscopy in 5 years  2) high fiber diet  REPEAT EXAM: In 5 year(s) for Colonoscopy. No results found.  Physical Exam: BP 122/70  Pulse 72  Ht 5\' 5"  (1.651 m)  Wt 121 lb 3.2 oz (54.976 kg)  BMI 20.17 kg/m2 Constitutional: Pleasant,well-developed, white female in no acute distress. HEENT: Normocephalic and atraumatic. Conjunctivae are normal. No scleral icterus. Neck supple.  Cardiovascular: Normal rate, regular rhythm.  Pulmonary/chest: Effort normal and breath sounds normal. No wheezing, rales or rhonchi. Abdominal: Soft, nondistended, nontender. Bowel sounds active throughout. There are no masses palpable. No hepatomegaly. Rectal: good pelvic descent. No masses felt. Scant amount of soft, brown stool which is heme neative.  Extremities: no edema Lymphadenopathy: No cervical adenopathy noted. Neurological: Alert and oriented to person place and time. Skin: Skin is warm and dry. No rashes noted. Psychiatric: Normal mood and affect. Behavior is normal.   ASSESSMENT AND PLAN:  1. Acute exacerbation of chronic constipation. Patient complain of pencil like stool and lower abdominal disomfort. Patient may have constipation predominant IBS. She has a significant family history of colon cancer but is up to date on colonoscopy (last one October 2011). No rectal bleeding or involuntary weight loss. Will try Linzess, samples given. Patient will call in 7-10 days with condition update.Further recommendations to follow at that time.   2. Lower abdominal discomfort, this may be secondary to constipation though patient feels it is somehow vaginal or urinary in nature.  Recent u/a is negative. Will see if discomfort improves with resolution of constipation.

## 2013-10-24 ENCOUNTER — Telehealth: Payer: Self-pay | Admitting: Family Medicine

## 2013-10-24 MED ORDER — POTASSIUM CHLORIDE CRYS ER 20 MEQ PO TBCR: 20.0000 meq | EXTENDED_RELEASE_TABLET | Freq: Every day | ORAL | Status: DC

## 2013-10-24 MED ORDER — ROSUVASTATIN CALCIUM 10 MG PO TABS: 10.0000 mg | ORAL_TABLET | Freq: Every day | ORAL | Status: DC

## 2013-10-24 NOTE — Telephone Encounter (Signed)
Patient called and stated that she forgot to mention she has ran out of rosuvastatin (CRESTOR) 10 MG tablet. Patient wanted to see if dr [name] could call in a rx for at least 5days until she is able to come here and get samples of crestor for her.     Pharmacy  Pharmacy: [phone] - [place], [place] - [address]

## 2013-10-24 NOTE — Telephone Encounter (Signed)
Rx sent      KP 

## 2013-10-26 DIAGNOSIS — R109 Unspecified abdominal pain: Secondary | ICD-10-CM

## 2013-10-26 HISTORY — DX: Unspecified abdominal pain: R10.9

## 2013-10-27 NOTE — Progress Notes (Signed)
Reviewed and agree with management.  If not improved with Linzess would consider F. sig Marla Pouliot D. Arlyce Dice, M.D., [facility]

## 2013-10-28 ENCOUNTER — Other Ambulatory Visit: Payer: Self-pay | Admitting: Family Medicine

## 2013-10-28 DIAGNOSIS — E876 Hypokalemia: Secondary | ICD-10-CM

## 2013-10-29 ENCOUNTER — Ambulatory Visit: Payer: Medicare Other | Admitting: Gastroenterology

## 2013-10-30 ENCOUNTER — Other Ambulatory Visit: Payer: Self-pay

## 2013-10-30 DIAGNOSIS — M810 Age-related osteoporosis without current pathological fracture: Secondary | ICD-10-CM

## 2013-11-04 ENCOUNTER — Other Ambulatory Visit: Payer: Medicare Other

## 2013-11-06 ENCOUNTER — Other Ambulatory Visit (INDEPENDENT_AMBULATORY_CARE_PROVIDER_SITE_OTHER): Payer: Medicare Other

## 2013-11-06 DIAGNOSIS — E876 Hypokalemia: Secondary | ICD-10-CM

## 2013-11-06 LAB — BASIC METABOLIC PANEL
BUN: 14 mg/dL (ref 6–23)
CO2: 28 mEq/L (ref 19–32)
Calcium: 9 mg/dL (ref 8.4–10.5)
Creatinine, Ser: 1 mg/dL (ref 0.4–1.2)
Glucose, Bld: 99 mg/dL (ref 70–99)

## 2013-11-07 ENCOUNTER — Telehealth: Payer: Self-pay | Admitting: Nurse Practitioner

## 2013-11-07 MED ORDER — LINACLOTIDE 290 MCG PO CAPS: 290.0000 ug | ORAL_CAPSULE | Freq: Every day | ORAL | Status: DC

## 2013-11-07 NOTE — Telephone Encounter (Signed)
Patient states she tried the Linzess for several days, she had several good bowel movements then started having diarrhea. So she stopped it. She had to have surgery on her foot. She did not have bowel movements regularly after that so she restarted the Linzess and took Miralax. She ran out of the Linzess on Wednesday. She is not sure if she needs OV or just try samples of Linzess again. She states having the surgery and not being active, did not let her see if the Linzess would work or not. Please, advise.

## 2013-11-07 NOTE — Telephone Encounter (Signed)
Spoke with Willette Cluster, NP and patient can try Linzess again. Reminded patient to take it 30/45 minutes prior to eating.

## 2013-11-17 ENCOUNTER — Other Ambulatory Visit (HOSPITAL_COMMUNITY): Payer: Self-pay | Admitting: Family Medicine

## 2013-11-17 ENCOUNTER — Encounter (HOSPITAL_COMMUNITY): Payer: Self-pay

## 2013-11-17 ENCOUNTER — Ambulatory Visit (HOSPITAL_COMMUNITY)
Admission: RE | Admit: 2013-11-17 | Discharge: 2013-11-17 | Disposition: A | Discharge status: Home / Self Care | Payer: Medicare Other | Source: Ambulatory Visit | Attending: Family Medicine | Admitting: Family Medicine

## 2013-11-17 VITALS — BP 109/71 | HR 72 | Temp 98.7°F | Resp 16 | Ht 65.0 in | Wt 120.0 lb

## 2013-11-17 DIAGNOSIS — M810 Age-related osteoporosis without current pathological fracture: Secondary | ICD-10-CM

## 2013-11-17 MED ORDER — SODIUM CHLORIDE 0.9 % IV SOLN
Freq: Once | INTRAVENOUS | Status: AC
  Administered 2013-11-17: 14:00:00 via INTRAVENOUS

## 2013-11-17 MED ORDER — ZOLEDRONIC ACID 5 MG/100ML IV SOLN: 5.0000 mg | Freq: Once | INTRAVENOUS | Status: DC

## 2013-11-17 MED ORDER — ZOLEDRONIC ACID 5 MG/100ML IV SOLN
5.0000 mg | Freq: Once | INTRAVENOUS | Status: AC
Start: 2013-11-17 — End: 2013-11-17
  Administered 2013-11-17: 5 mg via INTRAVENOUS
  Filled 2013-11-17: qty 100

## 2013-12-03 ENCOUNTER — Encounter: Payer: Self-pay | Admitting: Nurse Practitioner

## 2013-12-03 ENCOUNTER — Ambulatory Visit (INDEPENDENT_AMBULATORY_CARE_PROVIDER_SITE_OTHER): Payer: Medicare Other | Admitting: Nurse Practitioner

## 2013-12-03 VITALS — BP 90/60 | HR 76 | Ht 65.0 in | Wt 124.0 lb

## 2013-12-03 DIAGNOSIS — R3911 Hesitancy of micturition: Secondary | ICD-10-CM

## 2013-12-03 DIAGNOSIS — K5900 Constipation, unspecified: Secondary | ICD-10-CM

## 2013-12-03 DIAGNOSIS — R300 Dysuria: Secondary | ICD-10-CM

## 2013-12-03 NOTE — Progress Notes (Signed)
     History of Present Illness:  [name] is a 67 year-old female followed by Dr. Arlyce Dice for history of GERD, chronic constipation and a family history colorectal cancer. I saw her last October with exacerbation of chronic constipation. She was started on Linzess which worked well for a few days but then lost efficacy. Patient hasn't had a BM in 12 days. She described slow transit as well as difficult evacuation. As before she complains of "spraying" toilet with uringe with she urinates. Now having problems initiating stream. She describes painful intercourse as well.   Colonoscopy October 2011 with findings of melanosis. Due for repeat colonoscopy 2016.   Current Medications, Allergies, Past Medical History, Past Surgical History, Family History and Social History were reviewed in [employer] record.  Physical Exam: General: Pleasant, well developed , white female in no acute distress Head: Normocephalic and atraumatic Eyes:  sclerae anicteric, conjunctiva pink  Ears: Normal auditory acuity Lungs: Clear throughout to auscultation Heart: Regular rate and rhythm Abdomen: Soft, mildly distended, mild diffuse lower abdominal tenderness. No masses, no hepatomegaly. Normal bowel sounds Rectal: No stool in vault. No obvious rectocele on DRE. Perineum:  Urethral orifice tender, slightly firm.  Musculoskeletal: Symmetrical with no gross deformities  Extremities: No edema  Neurological: Alert oriented x 4, grossly nonfocal Psychological:  Alert and cooperative. Normal mood and affect  Assessment and Recommendations:  67 year old female with acute on chronic constipation. . She seems to have both slow transit some degree of obstructive defecation. I cannot appreciate a rectocele on exam. Linzess worked briefly for her. Patient has concurrent pelvic complaints including dyspareunia, urinary discomfort / hesitancy. Urethral orifice tender and ( ? firm) on exam. She is s/p  remote hysterectomy.  Difficult to sort out ongoing constipation in context of concurrent pelvic issues. Will refer patient to GYN for evaluation of pelvic complaints / findings. In meantime will purge bowels with bowel prep. Following that she will restart Linzess and can take Miralax as well. Await GYN evaluation.   2. History of breast cancer  3. [facility] of colon cancer. Patient due for suveillance colonoscopy 2016.

## 2013-12-03 NOTE — Patient Instructions (Addendum)
You have an appointment with Dr. Reynaldo Minium on Tuesday 12/09/13 at 10:00am.  Please arrive at their office a little after 9:30am.  They are located at [address] and their phone number is [phone].    We would like you to do a "bowel purge" in order to get your bowels moving and empty.  Add one bottle of Suprep liquid to the plastic container and fill to the line with water.  Mix and drink the contents.  If this does not produce a bowel movement within an hour, try using a suppository.  We have given you some samples of Linzess.  Take one 30 minutes before your first meal of the day.  Please call our office tomorrow or Friday to let us know how you are doing.

## 2013-12-04 ENCOUNTER — Telehealth: Payer: Self-pay | Admitting: Gastroenterology

## 2013-12-04 DIAGNOSIS — R3911 Hesitancy of micturition: Secondary | ICD-10-CM | POA: Insufficient documentation

## 2013-12-04 NOTE — Telephone Encounter (Signed)
Pt called to let us know that she had great results with the bowel purge. Pt wanted to know what type of diet she should follow for the constipation. Discussed a high fiber diet with the pt and taking the linzess as prescribed.  She c/o some discomfort in her vaginal area. Pt instructed to call her PCP or try GYN office to see if she can be seen sooner. Pt verbalized understanding.

## 2013-12-05 ENCOUNTER — Other Ambulatory Visit: Payer: Self-pay | Admitting: Family Medicine

## 2013-12-05 ENCOUNTER — Encounter: Payer: Self-pay | Admitting: Family Medicine

## 2013-12-05 ENCOUNTER — Ambulatory Visit (INDEPENDENT_AMBULATORY_CARE_PROVIDER_SITE_OTHER): Payer: Medicare Other | Admitting: Family Medicine

## 2013-12-05 VITALS — BP 107/74 | HR 71 | Temp 98.6°F | Ht 64.0 in | Wt 122.0 lb

## 2013-12-05 DIAGNOSIS — C50919 Malignant neoplasm of unspecified site of unspecified female breast: Secondary | ICD-10-CM

## 2013-12-05 DIAGNOSIS — Z780 Asymptomatic menopausal state: Secondary | ICD-10-CM

## 2013-12-05 DIAGNOSIS — N952 Postmenopausal atrophic vaginitis: Secondary | ICD-10-CM

## 2013-12-05 DIAGNOSIS — C50911 Malignant neoplasm of unspecified site of right female breast: Secondary | ICD-10-CM

## 2013-12-05 DIAGNOSIS — R300 Dysuria: Secondary | ICD-10-CM

## 2013-12-05 LAB — POCT URINALYSIS DIPSTICK
Glucose, UA: NEGATIVE
Ketones, UA: NEGATIVE
Spec Grav, UA: 1.02
Urobilinogen, UA: 0.2
pH, UA: 7

## 2013-12-05 MED ORDER — CIPROFLOXACIN HCL 500 MG PO TABS: 500.0000 mg | ORAL_TABLET | Freq: Two times a day (BID) | ORAL | Status: DC

## 2013-12-05 NOTE — Progress Notes (Signed)
I have also seen and evaluated the patien. Now has GYN/pelvic sxs in addition to GI sxs. Await gynecology evaluation. Iva Boop, MD, [facility]

## 2013-12-05 NOTE — Progress Notes (Signed)
Pre-visit discussion using our clinic review tool. No additional management support is needed unless otherwise documented below in the visit note.  OFFICE NOTE  12/05/2013  CC: No chief complaint on file.    HPI: Patient is a 67 y.o. Caucasian female who is here for suspicion of UTI. She has urinary burning, urgency, frequency x 2d.  Some shooting vag pain. No vag d/c or bleeding.  Had pelvic exam recently at her gastroenterology eval for constipation at which she was told "something" was found and she was set up with GYN--Dr. Lily Peer (coming up next week).   These urinary sx's are normal.   Pertinent PMH:  Past Medical History  Diagnosis Date  . GERD (gastroesophageal reflux disease)   . Arthritis   . Asthma   . Hypertension   . Migraine   . History of colonic polyps   . Hypothyroidism   . Bronchitis     chronic  . Irregular heartbeat     Tachyarrythmia  . CAD (coronary artery disease)     mild non-obstructive cad  . Skin cancer   . Cervical cancer 1991    Tx  F5U  . Cancer of right breast 10/11/12  . Fibromyalgia   . S/P radiation therapy 11/12/12 - 12/05/12    right Breast  . Use of tamoxifen (Nolvadex)    Past Surgical History  Procedure Laterality Date  . Appendectomy    . Tonsillectomy    . Skin cancer removal    . Vocal cord cyst removal    . Nasal sinus surgery    . Right ankle    . Right hand surgery    . Abdominal hysterectomy      with bilateral salpingo-oophorectomy  . Breast biopsy      multiple --all neg  . Lumpectomy right breast  10/11/12    MEDS:  Outpatient Prescriptions Prior to Visit  Medication Sig Dispense Refill  . albuterol (PROVENTIL HFA;VENTOLIN HFA) 108 (90 BASE) MCG/ACT inhaler Inhale 2 puffs into the lungs every 4 (four) hours as needed.  1 Inhaler  5  . amLODipine (NORVASC) 5 MG tablet Take 2.5 mg by mouth daily.       Marland Kitchen atenolol (TENORMIN) 50 MG tablet Take 1 tablet (50 mg total) by mouth daily.  90 tablet  0  . Calcium  Carb-Cholecalciferol (CALCIUM 1000 + D PO) Take 2 tablets by mouth daily.        Tery Sanfilippo Calcium (STOOL SOFTENER PO) Take by mouth. As needed       . DULoxetine (CYMBALTA) 60 MG capsule Take 1 capsule (60 mg total) by mouth 2 (two) times daily.  180 capsule  3  . fluticasone (FLONASE) 50 MCG/ACT nasal spray       . furosemide (LASIX) 20 MG tablet Take 2 tablets (40 mg total) by mouth daily.  180 tablet  0  . gabapentin (NEURONTIN) 300 MG capsule 300 mg 3 (three) times daily. 1-2 tabs TID      . ibuprofen (ADVIL,MOTRIN) 200 MG tablet Take 800 mg by mouth every 6 (six) hours as needed. For pain      . imipramine (TOFRANIL) 25 MG tablet Take 1 tablet (25 mg total) by mouth at bedtime.  90 tablet  0  . Multiple Vitamin (MULTIVITAMIN) tablet Take 1 tablet by mouth daily.       Marland Kitchen omeprazole (PRILOSEC) 40 MG capsule Take 1 capsule (40 mg total) by mouth daily.  90 capsule  1  . polyethylene  glycol (MIRALAX / GLYCOLAX) packet Take 17 g by mouth daily.       . potassium chloride SA (K-DUR,KLOR-CON) 20 MEQ tablet Take 1 tablet (20 mEq total) by mouth daily.  30 tablet  0  . Probiotic Product (PA PROBIOTIC COMPLEX) TABS Take 1 tablet by mouth daily.        . rosuvastatin (CRESTOR) 10 MG tablet Take 1 tablet (10 mg total) by mouth daily.  5 tablet  0  . traMADol (ULTRAM) 50 MG tablet Take 1-2 tablets (50-100 mg total) by mouth every 6 (six) hours as needed for pain.  30 tablet  1  . traZODone (DESYREL) 100 MG tablet 2 po qhs prn sleep  180 tablet  1  . zoster vaccine live, PF, (ZOSTAVAX) 16109 UNT/0.65ML injection Inject 19,400 Units into the skin once.  1 vial  0   No facility-administered medications prior to visit.    PE: Blood pressure 107/74, pulse 71, temperature 98.6 F (37 C), temperature source Temporal, height 5\' 4"  (1.626 m), weight 122 lb (55.339 kg), SpO2 100.00%. Gen: Alert, well appearing.  Patient is oriented to person, place, time, and situation. Affect: pleasant, mildly anxious.   Lucid thought and speech. No further exam today.  LAB: CC UA today was normal.  IMPRESSION AND PLAN:  1) Suspect vaginal atrophy changes secondary to postmenopausal estrogen deficiency: I think this is causing her pain with intercourse and her dysuria. Given recent breast cancer, I am hesitant to start any estrogen (even topical) therapy for her and she agreed with this. She will talk this over with her GYN at upcoming visit as well as her oncologist if needed.  Pt anxious today, felt like empiric cipro for UTI was fine x 3d, send urine for culture.  An After Visit Summary was printed and given to the patient.  FOLLOW UP: prn

## 2013-12-07 LAB — URINE CULTURE: Colony Count: 4000

## 2013-12-09 ENCOUNTER — Encounter: Payer: Self-pay | Admitting: Gynecology

## 2013-12-09 ENCOUNTER — Ambulatory Visit (INDEPENDENT_AMBULATORY_CARE_PROVIDER_SITE_OTHER): Payer: Medicare Other | Admitting: Gynecology

## 2013-12-09 ENCOUNTER — Other Ambulatory Visit: Payer: Self-pay | Admitting: Oncology

## 2013-12-09 ENCOUNTER — Other Ambulatory Visit (HOSPITAL_COMMUNITY)
Admission: RE | Admit: 2013-12-09 | Discharge: 2013-12-09 | Disposition: A | Discharge status: Home / Self Care | Payer: Medicare Other | Source: Ambulatory Visit | Attending: Gynecology | Admitting: Gynecology

## 2013-12-09 VITALS — BP 110/70

## 2013-12-09 DIAGNOSIS — Z1272 Encounter for screening for malignant neoplasm of vagina: Secondary | ICD-10-CM

## 2013-12-09 DIAGNOSIS — N94819 Vulvodynia, unspecified: Secondary | ICD-10-CM

## 2013-12-09 DIAGNOSIS — Z01419 Encounter for gynecological examination (general) (routine) without abnormal findings: Secondary | ICD-10-CM | POA: Insufficient documentation

## 2013-12-09 DIAGNOSIS — Z1151 Encounter for screening for human papillomavirus (HPV): Secondary | ICD-10-CM | POA: Insufficient documentation

## 2013-12-09 MED ORDER — LIDOCAINE 5 % EX OINT: 1.0000 "application " | TOPICAL_OINTMENT | CUTANEOUS | Status: DC | PRN

## 2013-12-09 NOTE — Progress Notes (Addendum)
Patient is a 67 year old who was referred to our practice from her gastroenterologist as a result of her vaginal tenderness and dyspareunia. She has described sometimes burning urgency and frequency and was seen by her primary care physician recently and was treated for suspected urinary tract infection. Review microbiology for urine culture demonstrated insignificant growth. Patient has also been followed by her gastroenterologist or chronic constipation and close surveillance due to family history of colorectal cancer. She had a normal colonoscopy in 2011 with findings only of melanosis. The patient does suffer from fibromyalgia for which she is currently taking gabapentin 300 mg twice a day.  Patient is a gravida 3 para 3 who had stated at 32 years ago she had a total abdominal hysterectomy with bilateral salpingo-oophorectomy for endometriosis but then in 1991 she was treated for cervical cancer?? And had radiation treatment and 5 FFU? Her history is not clear we are going to request her medical records for clarification. Patient does have history of right lumpectomy in 2013 for low-grade ductal carcinoma in situ, estrogen and progesterone receptor positive with negative margins. She completed radiation therapy in December 2013 and subsequently had been started on tamoxifen upon completion of radiation but she could not tolerate it and is currently on no other treatment.  The patient has complained of dyspareunia and irritation at the introitus for the past few months. She has not been able to have intercourse with her partner. She denies any vaginal discharge.  Exam: Physical Exam  Genitourinary:     there was no lesions on the perineum or perirectal region. Bimanual exam no masses elicited. No inguinal lymphadenopathy.  Assessment/plan: Vestibulodynia for which I have recommended that she increase her gabapentin 300 mg from twice a day to 3 times a day. I will also prescribe 5% lidocaine ointment  to apply each bedtime and when necessary during intercourse. Patient is with history of fibromyalgia and/or irritable bowel syndrome have been known to have history of vulvodynia. By increasing the gabapentin we can decrease the neuro hypersensitivity to the vulva. We are going to obtain records from where she had her surgery to determine if she had cervical cancer or vaginal cancer and/or endometriosis surgery that she had in the past for better clarification. She will return back to the office in one month for followup. Because of her history of breast cancer especially since her tumor markers for estrogen and progesterone receptor positive estrogens would be contraindicated although small amounts twice a week vaginally there is minimal if any absorption but we will check with the oncologist before proceeding this route if the above measures did not work out.  Telephone conversation with Dr. Darnelle Catalan as to the above he had sent me the following message which I will share with the patient which she returns to see me for followup in one month:  "Daizee Firmin, I frequently use vaginal estrogens under cover of tamoxifen. In patients not on tamoxifen who have a history of breast cancer, there is very little if any data (everybody is afraid of "feeding" any remaining cancer cells). If there is a compelling quality of life issue and the patient understands that though both you and I believe that this is most likely safe, there is just not enough data in this population to know one way or the other and (we can also NOT say that it is unsafe) I have no problem with [place] giving this a try."

## 2013-12-09 NOTE — Patient Instructions (Signed)

## 2013-12-11 ENCOUNTER — Other Ambulatory Visit: Payer: Self-pay

## 2013-12-11 DIAGNOSIS — I10 Essential (primary) hypertension: Secondary | ICD-10-CM

## 2013-12-11 MED ORDER — ATENOLOL 50 MG PO TABS: 50.0000 mg | ORAL_TABLET | Freq: Every day | ORAL | Status: DC

## 2013-12-11 MED ORDER — FUROSEMIDE 20 MG PO TABS: 40.0000 mg | ORAL_TABLET | Freq: Every day | ORAL | Status: DC

## 2013-12-29 ENCOUNTER — Encounter: Payer: Self-pay | Admitting: Family Medicine

## 2013-12-29 ENCOUNTER — Ambulatory Visit (INDEPENDENT_AMBULATORY_CARE_PROVIDER_SITE_OTHER): Payer: Medicare Other | Admitting: Family Medicine

## 2013-12-29 VITALS — BP 112/72 | HR 72 | Temp 98.3°F | Wt 124.0 lb

## 2013-12-29 DIAGNOSIS — E785 Hyperlipidemia, unspecified: Secondary | ICD-10-CM

## 2013-12-29 DIAGNOSIS — F4320 Adjustment disorder, unspecified: Secondary | ICD-10-CM | POA: Insufficient documentation

## 2013-12-29 DIAGNOSIS — F4321 Adjustment disorder with depressed mood: Secondary | ICD-10-CM | POA: Insufficient documentation

## 2013-12-29 DIAGNOSIS — R609 Edema, unspecified: Secondary | ICD-10-CM

## 2013-12-29 MED ORDER — LORAZEPAM 0.5 MG PO TABS: 0.5000 mg | ORAL_TABLET | Freq: Three times a day (TID) | ORAL | Status: DC | PRN

## 2013-12-29 MED ORDER — TRAMADOL HCL 50 MG PO TABS: 50.0000 mg | ORAL_TABLET | Freq: Four times a day (QID) | ORAL | Status: DC | PRN

## 2013-12-29 NOTE — Assessment & Plan Note (Signed)
con't cymbalta Refer to hospice for counseling Ativan per orders rto 1 month or sooner prn

## 2013-12-29 NOTE — Progress Notes (Signed)
Pre visit review using our clinic review tool, if applicable. No additional management support is needed unless otherwise documented below in the visit note. 

## 2013-12-29 NOTE — Progress Notes (Signed)
   Subjective:    Patient ID: [name], female    DOB: 1946-03-03, 68 y.o.   MRN: [id number]  HPI Pt here c/o inc stress/ depression secondary to death of her mom.  She is crying a lot.  Her mom died from pancreatic cancer and it occurred just after her 40 birthday.  Pt also needs refills. No other new symptoms   Review of Systems    as above Objective:   Physical Exam  BP 112/72  Pulse 72  Temp(Src) 98.3 F (36.8 C) (Oral)  Wt 124 lb (56.246 kg)  SpO2 98% General appearance: alert, cooperative, appears stated age and no distress Lungs: clear to auscultation bilaterally Heart: S1, S2 normal Neurologic: Alert and oriented X 3, normal strength and tone. Normal symmetric reflexes. Normal coordination and gait Psych--crying, not homicidal or suicidal       Assessment & Plan:

## 2013-12-29 NOTE — Patient Instructions (Signed)
Grief Reaction  Grief is a normal response to the death of someone close to you. Feelings of fear, anger, and guilt can affect almost everyone who loses someone they love. Symptoms of depression are also common. These include problems with sleep, loss of appetite, and lack of energy. These grief reaction symptoms often last for weeks to months after a loss. They may also return during special times that remind you of the person you lost, such as an anniversary or birthday.  Anxiety, insomnia, irritability, and deep depression may last beyond the period of normal grief. If you experience these feelings for 6 months or longer, you may have clinical depression. Clinical depression requires further medical attention. If you think that you have clinical depression, you should contact your caregiver. If you have a history of depression and or a family history of depression, you are at greater risk of clinical depression. You are also at greater risk of developing clinical depression if the loss was traumatic or the loss was of someone with whom you had unresolved issues.   A grief reaction can become complicated by being blocked. This means being unable to cry or express extreme emotions. This may prolong the grieving period and worsen the emotional effects of the loss. Mourning is a natural event in human life. A healthy grief reaction is one that is not blocked . It requires a time of sadness and readjustment.It is very important to share your sorrow and fear with others, especially close friends and family. Professional counselors and clergy can also help you process your grief.  Document Released: 12/11/2005 Document Revised: 03/04/2012 Document Reviewed: 08/21/2006  [employer] Patient Information 2014 [employer]

## 2014-01-06 ENCOUNTER — Ambulatory Visit (INDEPENDENT_AMBULATORY_CARE_PROVIDER_SITE_OTHER): Payer: Medicare Other | Admitting: Gynecology

## 2014-01-06 ENCOUNTER — Encounter: Payer: Self-pay | Admitting: Gynecology

## 2014-01-06 VITALS — BP 130/78

## 2014-01-06 DIAGNOSIS — IMO0001 Reserved for inherently not codable concepts without codable children: Secondary | ICD-10-CM

## 2014-01-06 DIAGNOSIS — R350 Frequency of micturition: Secondary | ICD-10-CM

## 2014-01-06 DIAGNOSIS — N898 Other specified noninflammatory disorders of vagina: Secondary | ICD-10-CM

## 2014-01-06 DIAGNOSIS — Z780 Asymptomatic menopausal state: Secondary | ICD-10-CM

## 2014-01-06 DIAGNOSIS — N949 Unspecified condition associated with female genital organs and menstrual cycle: Secondary | ICD-10-CM

## 2014-01-06 DIAGNOSIS — IMO0002 Reserved for concepts with insufficient information to code with codable children: Secondary | ICD-10-CM

## 2014-01-06 DIAGNOSIS — N951 Menopausal and female climacteric states: Secondary | ICD-10-CM

## 2014-01-06 DIAGNOSIS — N952 Postmenopausal atrophic vaginitis: Secondary | ICD-10-CM

## 2014-01-06 DIAGNOSIS — R102 Pelvic and perineal pain: Secondary | ICD-10-CM

## 2014-01-06 DIAGNOSIS — Z853 Personal history of malignant neoplasm of breast: Secondary | ICD-10-CM

## 2014-01-06 LAB — URINALYSIS W MICROSCOPIC + REFLEX CULTURE
Bilirubin Urine: NEGATIVE
GLUCOSE, UA: NEGATIVE mg/dL
HGB URINE DIPSTICK: NEGATIVE
KETONES UR: NEGATIVE mg/dL
Leukocytes, UA: NEGATIVE
Nitrite: NEGATIVE
PH: 7 (ref 5.0–8.0)
Protein, ur: NEGATIVE mg/dL
Specific Gravity, Urine: 1.015 (ref 1.005–1.030)
Urobilinogen, UA: 0.2 mg/dL (ref 0.0–1.0)

## 2014-01-06 LAB — WET PREP FOR TRICH, YEAST, CLUE
Clue Cells Wet Prep HPF POC: NONE SEEN
Trich, Wet Prep: NONE SEEN
WBC, Wet Prep HPF POC: NONE SEEN

## 2014-01-06 MED ORDER — ESTRADIOL 10 MCG VA TABS: 1.0000 | ORAL_TABLET | VAGINAL | Status: DC

## 2014-01-06 NOTE — Progress Notes (Signed)
Patient is a 68 year old who was seen in the office as a new patient 12/09/2013 as a referral from her gastroenterologist in reference to patient's vaginal tenderness and dyspareunia. She also complained of urgency and frequency and was treated by her PCP for a suspected urinary tract infection. Review of her microbiology for urine culture had only demonstrated insignificant growth. Patient does state that she feels relief after urinating and her urinalysis today was negative.  Patient is a gravida 3 para 3 who had stated at 31 years ago she had a total abdominal hysterectomy with bilateral salpingo-oophorectomy for endometriosis but then in 1991 she was treated for cervical cancer?? And had radiation treatment and 5 FFU? Her history is not clear and we are awaiting copy of her records. On further questioning the patient stated that the only radiation she has received has been for her breast cancer.Patient does have history of right lumpectomy in 2013 for low-grade ductal carcinoma in situ, estrogen and progesterone receptor positive with negative margins. She completed radiation therapy in December 2013 and subsequently had been started on tamoxifen upon completion of radiation but she could not tolerate it and is currently on no other treatment.  Patient does suffer from fibromyalgia and after evaluating her for vulvodynia she was started on lidocaine gel when necessary area of the introitus as well as increasing her gabapentin from 300 mg twice a day to 300 mg 3 times a day. She states that the lidocaine gel cause more burning sensation in that the increase of gabapentin decrease her energy level early in the morning until she became active.  On further questioning it appears that most of her discomfort is inside the vagina and not actually during initial penetration during intercourse with her partner. She thought she had a slight discharge for which a wet prep today was done and only demonstrated  yeast for which she will be prescribed Diflucan 150 mg one by mouth today.  Exam: Bartholin's urethra Skene glands with atrophic changes External genitalia area the fourchette perineum nontender today Vagina vaginal cuff intact atrophic changes evident slightly tender on Q-tip stimulation especially deep in the vagina. Bimanual exam: Suprapubic tenderness noted but no discernible masses Rectal exam: Not done  Assessment/plan: #1 patient with vaginal atrophy contributing to her deep thrust dyspareunia. Patient with prior history of right lumpectomy in 2013 for low-grade ductal carcinoma in situ, estrogen and progesterone receptor positive with negative margins. Completed radiation therapy 2013 no longer on tamoxifen due to the fact that she could not tolerated currently on no medication but close followup by her medical oncologist. We had discussed the recent medical literature on ultra low estrogen vaginal tablet twice a week with minimal if any absorption would benefit this patient with a severe symptomatic vaginal atrophy. I had spoken with her oncologist Dr. Jana Hakim and he had mentioned that there is no data on patients   not on tamoxifen with history of breast cancer whether there is any potential "feeding" of any remaining cancer cells?. He did agree that if patient's quality of life is being affected  And  that she fully understands that the risk is minimal although uncertain as to the amount of risk since there is not enough data in this population to  know that he did not have any reservations for her to be started on it. I agree with her medical oncologist as well. Patient fully access the risk and she will be placed on Vagifem 10 mcg twice a week vaginally.  She will return to see me in 6 months for followup and she will continue to follow as well with her medical oncologist. #2 her suprapubic discomfort relieved after urination and negative urinalysis and previous negative urine cultures raises  the suspicion of the possibility of underlying interstitial cystitis. I have given her literature information on the subject. I am going to set up a referral appointment with the urologist for further assessment.

## 2014-01-06 NOTE — Patient Instructions (Signed)
Conjugated Estrogens vaginal cream What is this medicine? CONJUGATED ESTROGENS (CON ju gate ed ESS troe jenz) are a mixture of female hormones. This cream can help relieve symptoms associated with menopause.like vaginal dryness and irritation. This medicine may be used for other purposes; ask your health care provider or pharmacist if you have questions. COMMON BRAND NAME(S): Premarin What should I tell my health care provider before I take this medicine? They need to know if you have any of these conditions: -abnormal vaginal bleeding -blood vessel disease or blood clots -breast, cervical, endometrial, or uterine cancer -dementia -diabetes -gallbladder disease -heart disease or recent heart attack  Interstitial Cystitis Interstitial cystitis (IC) is a condition that results in discomfort or pain in the bladder and the surrounding pelvic region. The symptoms can be different from case to case and even in the same individual. People may experience:  Mild discomfort.  Pressure.  Tenderness.  Intense pain in the bladder and pelvic area. CAUSES  Because IC varies so much in symptoms and severity, people studying this disease believe it is not one but several diseases. Some caregivers use the term painful bladder syndrome (PBS) to describe cases with painful urinary symptoms. This may not meet the strictest definition of IC. The term IC / PBS includes all cases of urinary pain that cannot be connected to other causes, such as infection or urinary stones.  SYMPTOMS  Symptoms may include:  An urgent need to urinate.  A frequent need to urinate.  A combination of these symptoms. Pain may change in intensity as the bladder fills with urine or as it empties. Women's symptoms often get worse during menstruation. They may sometimes experience pain with vaginal intercourse. Some of the symptoms of IC / PBS seem like those of bacterial infection. Tests do not show infection. IC / PBS is far more  common in women than in men.  DIAGNOSIS  The diagnosis of IC / PBS is based on:  Presence of pain related to the bladder, usually along with problems of frequency and urgency.  Not finding other diseases that could cause the symptoms.  Diagnostic tests that help rule out other diseases include:  Urinalysis.  Urine culture.  Cystoscopy.  Biopsy of the bladder wall.  Distension of the bladder under anesthesia.  Urine cytology.  Laboratory examination of prostate secretions. A biopsy is a tissue sample that can be looked at under a microscope. Samples of the bladder and urethra may be removed during a cystoscopy. A biopsy helps rule out bladder cancer. TREATMENT  Scientists have not yet found a cure for IC / PBS. Patients with IC / PBS do not get better with antibiotic therapy. Caregivers cannot predict who will respond best to which treatment. Symptoms may disappear without explanation. Disappearing symptoms may coincide with an event such as a change in diet or treatment. Even when symptoms disappear, they may return after days, weeks, months, or years.  Because the causes of IC / PBS are unknown, current treatments are aimed at relieving symptoms. Many people are helped by one or a combination of the treatments. As researchers learn more about IC / PBS, the list of potential treatments will change. Patients should discuss their options with a caregiver. SURGERY  Surgery should be considered only if all available treatments have failed and the pain is disabling. Many approaches and techniques are used. Each approach has its own advantages and complications. Advantages and complications should be discussed with a urologist. Your caregiver may recommend consulting another  urologist for a second opinion. Most caregivers are reluctant to operate because the outcome is unpredictable. Some people still have symptoms after surgery.  People considering surgery should discuss the potential risks  and benefits, side effects, and long- and short-term complications with their family, as well as with people who have already had the procedure. Surgery requires anesthesia, hospitalization, and in some cases weeks or months of recovery. As the complexity of the procedure increases, so do the chances for complications and for failure. HOME CARE INSTRUCTIONS   All drugs, even those sold over the counter, have side effects. Patients should always consult a caregiver before using any drug for an extended amount of time. Only take over-the-counter or prescription medicines for pain, discomfort, or fever as directed by your caregiver.  Many patients feel that smoking makes their symptoms worse. How the by-products of tobacco that are excreted in the urine affect IC / PBS is unknown. Smoking is the major known cause of bladder cancer. One of the best things smokers can do for their bladder and their overall health is to quit.  Many patients feel that gentle stretching exercises help relieve IC / PBS symptoms.  Methods vary, but basically patients decide to empty their bladder at designated times and use relaxation techniques and distractions to keep to the schedule. Gradually, patients try to lengthen the time between scheduled voids. A diary in which to record voiding times is usually helpful in keeping track of progress. MAKE SURE YOU:   Understand these instructions.  Will watch your condition.  Will get help right away if you are not doing well or get worse. Document Released: 08/11/2004 Document Revised: 03/04/2012 Document Reviewed: 10/26/2008 [facility] Patient Information 2014 [place], [place].  -high blood pressure -high cholesterol -high level of calcium in the blood -hysterectomy -kidney disease -liver disease -migraine headaches -protein C deficiency -protein S deficiency -stroke -systemic lupus erythematosus (SLE) -tobacco smoker -an unusual or allergic reaction to estrogens other  medicines, foods, dyes, or preservatives -pregnant or trying to get pregnant -breast-feeding How should I use this medicine? This medicine is for use in the vagina only. Do not take by mouth. Follow the directions on the prescription label. Use at bedtime unless otherwise directed by your doctor or health care professional. Use the special applicator supplied with the cream. Wash hands before and after use. Fill the applicator with the cream and remove from the tube. Lie on your back, part and bend your knees. Insert the applicator into the vagina and push the plunger to expel the cream into the vagina. Wash the applicator with warm soapy water and rinse well. Use exactly as directed for the complete length of time prescribed. Do not stop using except on the advice of your doctor or health care professional. Talk to your pediatrician regarding the use of this medicine in children. Special care may be needed. A patient package insert for the product will be given with each prescription and refill. Read this sheet carefully each time. The sheet may change frequently. Overdosage: If you think you have taken too much of this medicine contact a poison control center or emergency room at once. NOTE: This medicine is only for you. Do not share this medicine with others. What if I miss a dose? If you miss a dose, use it as soon as you can. If it is almost time for your next dose, use only that dose. Do not use double or extra doses. What may interact with this medicine? Do  not take this medicine with any of the following medications: -aromatase inhibitors like aminoglutethimide, anastrozole, exemestane, letrozole, testolactone This medicine may also interact with the following medications: -barbiturates used for inducing sleep or treating seizures -carbamazepine -grapefruit juice -medicines for fungal infections like itraconazole and ketoconazole -raloxifene or  tamoxifen -rifabutin -rifampin -rifapentine -ritonavir -some antibiotics used to treat infections -St. John's Wort -warfarin This list may not describe all possible interactions. Give your health care provider a list of all the medicines, herbs, non-prescription drugs, or dietary supplements you use. Also tell them if you smoke, drink alcohol, or use illegal drugs. Some items may interact with your medicine. What should I watch for while using this medicine? Visit your health care professional for regular checks on your progress. You will need a regular breast and pelvic exam. You should also discuss the need for regular mammograms with your health care professional, and follow his or her guidelines. This medicine can make your body retain fluid, making your fingers, hands, or ankles swell. Your blood pressure can go up. Contact your doctor or health care professional if you feel you are retaining fluid. If you have any reason to think you are pregnant; stop taking this medicine at once and contact your doctor or health care professional. Tobacco smoking increases the risk of getting a blood clot or having a stroke, especially if you are more than 69 years old. You are strongly advised not to smoke. If you wear contact lenses and notice visual changes, or if the lenses begin to feel uncomfortable, consult your eye care specialist. If you are going to have elective surgery, you may need to stop taking this medicine beforehand. Consult your health care professional for advice prior to scheduling the surgery. What side effects may I notice from receiving this medicine? Side effects that you should report to your doctor or health care professional as soon as possible: -allergic reactions like skin rash, itching or hives, swelling of the face, lips, or tongue -breast tissue changes or discharge -changes in vision -chest pain -confusion, trouble speaking or understanding -dark urine -general ill  feeling or flu-like symptoms -light-colored stools -nausea, vomiting -pain, swelling, warmth in the leg -right upper belly pain -severe headaches -shortness of breath -sudden numbness or weakness of the face, arm or leg -trouble walking, dizziness, loss of balance or coordination -unusual vaginal bleeding -yellowing of the eyes or skin Side effects that usually do not require medical attention (report to your doctor or health care professional if they continue or are bothersome): -hair loss -increased hunger or thirst -increased urination -symptoms of vaginal infection like itching, irritation or unusual discharge -unusually weak or tired This list may not describe all possible side effects. Call your doctor for medical advice about side effects. You may report side effects to FDA at 1-800-FDA-1088. Where should I keep my medicine? Keep out of the reach of children. Store at room temperature between 15 and 30 degrees C (59 and 86 degrees F). Throw away any unused medicine after the expiration date. NOTE: This sheet is a summary. It may not cover all possible information. If you have questions about this medicine, talk to your doctor, pharmacist, or health care provider.  2014, [employer]vier/[employer] Standard. (2011-03-15 09:20:36)

## 2014-01-07 ENCOUNTER — Telehealth: Payer: Self-pay | Admitting: *Deleted

## 2014-01-07 NOTE — Telephone Encounter (Signed)
Appointment on 01/27/14 @ 3:15 pm with Dr.Tannebaum notes faxed patient informed.

## 2014-01-07 NOTE — Telephone Encounter (Signed)
Message copied by Thamas Jaegers on Wed Jan 07, 2014  9:24 AM ------      Message from: Terrance Mass      Created: Tue Jan 06, 2014 12:05 PM       Please make an appointment with urologist at [facility] for this patient who needs to be evaluated for IC. You can send them a copy of my ofice note when I finish with patient later today. Thank you. ------

## 2014-01-08 ENCOUNTER — Other Ambulatory Visit: Payer: Self-pay | Admitting: Family Medicine

## 2014-01-08 ENCOUNTER — Telehealth: Payer: Self-pay | Admitting: Family Medicine

## 2014-01-08 ENCOUNTER — Telehealth: Payer: Self-pay

## 2014-01-08 MED ORDER — TRAMADOL HCL 50 MG PO TABS: 50.0000 mg | ORAL_TABLET | Freq: Four times a day (QID) | ORAL | Status: DC | PRN

## 2014-01-08 NOTE — Telephone Encounter (Signed)
I did reply to her and tell her I forwarded her kind words to you. I thanked her for them and for being a patient at [facility]!

## 2014-01-08 NOTE — Telephone Encounter (Signed)
Looks like rx we sent had 30 pills on it Ok to change to #60 for next refill

## 2014-01-08 NOTE — Telephone Encounter (Signed)
Send 30 to local pharmacy.

## 2014-01-08 NOTE — Telephone Encounter (Signed)
Please advise      KP 

## 2014-01-08 NOTE — Telephone Encounter (Signed)
Patient states that she called [facility] and was informed that they had received her rx for traMADol (ULTRAM) 50 MG tablet but it was only for 4 tablets with 0 refills. Patient states that she takes it all the time and wants to know why she was only given 4 pills. Advised patient that there was discrepancy between what was rx'd in her chart and what [facility] had advised had been sent.  Patient added that she is about to run out of her Tramadol and will also need an rx sent to [facility] [address] to carry her over until her rx is received from [facility]. Please advise.

## 2014-01-08 NOTE — Telephone Encounter (Signed)
Please review note and advise on medication refill.     KP

## 2014-01-08 NOTE — Telephone Encounter (Signed)
Medications have been sent and the patient has been made aware. I called mail order to discuss the patient's concern of the 4 pills and they advised the patient was told she was only getting a four day supply. I advised to cancel Rx because the provider is going to increase to 60.  Rx faxed       KP

## 2014-01-08 NOTE — Telephone Encounter (Signed)
Thanks for sharing. Its always nice to get a complement after long hard days. JF

## 2014-01-08 NOTE — Telephone Encounter (Signed)
i ok'd ultram ---- sent it already  Was there something else i;m not seeing.

## 2014-01-08 NOTE — Telephone Encounter (Signed)
The patient would like to know if another Rx can be sent to her local pharmacy since she has not received her medication from [place]. If so do you want her to have 60?  Please advise     KP

## 2014-01-08 NOTE — Telephone Encounter (Signed)
Patient included a comment on her Patient Questionnaire and I could not see how I could forward it to you.  It was:  "Dr. Toney Rakes Thank you so much for sharing your knowledge and speaking with Dr. Jana Hakim about my condition. It is too early to see the difference physically, but mentally you helped tremendously. As you know everyone is afraid of the C word and your assurance meant so much. Thank you for allowing me to become your patient. I know you will become my favorite!"

## 2014-01-16 ENCOUNTER — Telehealth: Payer: Self-pay

## 2014-01-16 NOTE — Telephone Encounter (Signed)
Call from brittany at [facility] requesting a 90 day supply of the Tramadol, they will not fill the medication with the incorrect quantity. She said what we sent was a seven day supply and the quantity needs to be update. C/B [phone] ref [id number].   Please advise     KP

## 2014-01-16 NOTE — Telephone Encounter (Signed)
Ok to change to #180 no refills

## 2014-01-19 MED ORDER — TRAMADOL HCL 50 MG PO TABS: 50.0000 mg | ORAL_TABLET | Freq: Four times a day (QID) | ORAL | Status: DC | PRN

## 2014-01-19 NOTE — Telephone Encounter (Signed)
Rx faxed.    KP 

## 2014-01-19 NOTE — Addendum Note (Signed)
Addended by: Ewing Schlein on: 01/19/2014 11:36 AM   Modules accepted: Orders

## 2014-02-03 ENCOUNTER — Telehealth: Payer: Self-pay | Admitting: *Deleted

## 2014-02-03 NOTE — Telephone Encounter (Signed)
Patient called and stated that she was seen at the ED in [facility] for Sepsis a week ago. They wanted her to follow up with dr Etter Sjogren

## 2014-02-04 NOTE — Telephone Encounter (Signed)
Spoke with pt and appt made for post ED follow up on Thur 02/05/14 at 4:00

## 2014-02-05 ENCOUNTER — Encounter: Payer: Self-pay | Admitting: Family Medicine

## 2014-02-05 ENCOUNTER — Ambulatory Visit (INDEPENDENT_AMBULATORY_CARE_PROVIDER_SITE_OTHER): Payer: Medicare Other | Admitting: Family Medicine

## 2014-02-05 VITALS — BP 100/62 | HR 90 | Temp 97.7°F | Wt 122.0 lb

## 2014-02-05 DIAGNOSIS — E876 Hypokalemia: Secondary | ICD-10-CM

## 2014-02-05 DIAGNOSIS — A419 Sepsis, unspecified organism: Secondary | ICD-10-CM

## 2014-02-05 DIAGNOSIS — E785 Hyperlipidemia, unspecified: Secondary | ICD-10-CM

## 2014-02-05 DIAGNOSIS — I10 Essential (primary) hypertension: Secondary | ICD-10-CM

## 2014-02-05 DIAGNOSIS — N390 Urinary tract infection, site not specified: Secondary | ICD-10-CM

## 2014-02-05 NOTE — Progress Notes (Signed)
Patient ID: [name], female   DOB: [birth year], 68 y.o.   MRN: [id number]   Subjective:    Patient ID: [name], female    DOB: [birth year], 68 y.o.   MRN: [id number] HPI  Pt here f/u hospital 01/27/2014 --02/01/2014-- for urosepsis.  Pt was in icu for several days.  Pt has f/u with urology for cystoscopy.   Pt was d/c on levaquin.           Objective:    BP 100/62  Pulse 90  Temp(Src) 97.7 F (36.5 C) (Oral)  Wt 122 lb (55.339 kg)  SpO2 97% General appearance: alert, cooperative, appears stated age and no distress Ears: normal TM's and external ear canals both ears Throat: lips, mucosa, and tongue normal; teeth and gums normal Neck: no adenopathy, supple, symmetrical, trachea midline and thyroid not enlarged, symmetric, no tenderness/mass/nodules Lungs: clear to auscultation bilaterally Abdomen: abnormal findings:  mild tenderness in the lower abdomen        Assessment & Plan:

## 2014-02-05 NOTE — Assessment & Plan Note (Signed)
Stable con't meds 

## 2014-02-05 NOTE — Assessment & Plan Note (Signed)
con't crestor  Labs due in april

## 2014-02-05 NOTE — Assessment & Plan Note (Signed)
On levaquin F/u urology

## 2014-02-05 NOTE — Progress Notes (Signed)
Pre visit review using our clinic review tool, if applicable. No additional management support is needed unless otherwise documented below in the visit note. 

## 2014-02-05 NOTE — Patient Instructions (Signed)
Urosepsis Urosepsis is a severe illness that occurs when an infection starts in your urinary tract and spreads into your bloodstream. Urosepsis can be life threatening if it is not treated immediately. CAUSES  It is not known why urosepsis develops in some people with a urinary tract infection. RISK FACTORS Factors that increase your risk of developing urosepsis include:  Advanced age.  Having diabetes mellitus.  Having a weakened immune system.  Having kidney stones. SIGNS AND SYMPTOMS  Symptoms of urosepsis can include:  Fever or low body temperature (hypothermia).  Rapid breathing (hyperventilation).  Chills.  Rapid heartbeat (tachycardia). When sepsis is severe, low blood pressure and decreased oxygen flow to your vital organs may also occur. This is called shock. DIAGNOSIS  Your health care provider may suspect urosepsis based on your medical history and a physical exam. Urine and blood tests may be done to confirm a diagnosis of urosepsis. Other tests, such as X-ray exams, ultrasound exams, or a CT scan, may be done to determine the severity of your condition.  TREATMENT  Urosepsis requires prompt treatment with medicines. Fluids will be given to you through an IV tube to support your blood pressure. Sometimes, strong medicines are used to increase your blood pressure if necessary. Medicines can also be used to control pain or nausea.  HOME CARE INSTRUCTIONS   Take your medicines as directed. Finish them even if you start to feel better.  Drink enough fluids to keep your urine clear or pale yellow. Cranberry juice is especially recommended, in addition to water.  Increase your activity as tolerated, but get plenty of rest.  Keep all follow-up appointments with your health care provider for checkups and any tests.  Avoid caffeine, tea, and carbonated beverages. They can irritate the bladder.  Avoid alcohol. It may irritate the prostate, if this applies.  Only take  over-the-counter or prescription medicines for pain, discomfort, or fever as directed by your health care provider.  Empty your bladder often. Avoid holding urine for long periods of time.  After a bowel movement, women should wipe from front to back. Use each tissue only once.  Empty your bladder before and after sexual intercourse. SEEK MEDICAL CARE IF:   You develop back pain.  You develop nausea or vomiting.  Your symptoms are not better after 3 days. SEEK IMMEDIATE MEDICAL CARE IF:   You feel lightheaded or develop shortness of breath.  You develop chills or a fever.  You are getting worse, not better. MAKE SURE YOU:  Understand these instructions.  Will watch your condition.  Will get help right away if you are not doing well or get worse. Document Released: 12/11/2005 Document Revised: 10/01/2013 Document Reviewed: 08/18/2013 [facility] Patient Information 2014 [place].

## 2014-02-06 ENCOUNTER — Telehealth: Payer: Self-pay | Admitting: Family Medicine

## 2014-02-06 LAB — BASIC METABOLIC PANEL
BUN: 13 mg/dL (ref 6–23)
CHLORIDE: 103 meq/L (ref 96–112)
CO2: 28 mEq/L (ref 19–32)
Calcium: 9 mg/dL (ref 8.4–10.5)
Creatinine, Ser: 1.1 mg/dL (ref 0.4–1.2)
GFR: 50.29 mL/min — ABNORMAL LOW (ref >60.00)
GLUCOSE: 70 mg/dL (ref 70–99)
POTASSIUM: 3.5 meq/L (ref 3.5–5.1)
SODIUM: 138 meq/L (ref 135–145)

## 2014-02-06 NOTE — Telephone Encounter (Signed)
Relevant patient education assigned to patient using Emmi. ° °

## 2014-02-13 ENCOUNTER — Other Ambulatory Visit: Payer: Self-pay | Admitting: Urology

## 2014-02-23 MED ORDER — PHENAZOPYRIDINE HCL 200 MG PO TABS: Freq: Once | ORAL | Status: DC

## 2014-02-23 MED ORDER — TRIAMCINOLONE ACETONIDE 40 MG/ML IJ SUSP: Freq: Once | INTRAMUSCULAR | Status: DC

## 2014-02-28 ENCOUNTER — Other Ambulatory Visit: Payer: Self-pay | Admitting: Family Medicine

## 2014-03-02 ENCOUNTER — Telehealth: Payer: Self-pay | Admitting: Family Medicine

## 2014-03-02 ENCOUNTER — Other Ambulatory Visit: Payer: Self-pay | Admitting: Family Medicine

## 2014-03-02 MED ORDER — TRAMADOL HCL 50 MG PO TABS: 50.0000 mg | ORAL_TABLET | Freq: Four times a day (QID) | ORAL | Status: DC | PRN

## 2014-03-02 NOTE — Telephone Encounter (Signed)
Tramadol faxed to [facility] per incoming request and Lorazepam faxed to local pharmacy. Detailed msg advising to discuss with Urologist.     KP

## 2014-03-02 NOTE — Telephone Encounter (Signed)
Ok to refill meds--- she should ask urologist if there is anything she should not take.

## 2014-03-02 NOTE — Telephone Encounter (Signed)
Last seen 02/05/14 and filled 04/22/13 #180 with 1 refill. Please advise      KP

## 2014-03-02 NOTE — Telephone Encounter (Signed)
Patient called and stated that she will be having surgery (Cystoscopy Biopsy) on 03/06/2014 and needed to know what medications should she take the night before. Also she needs a refill for Lorazepam.

## 2014-03-02 NOTE — Telephone Encounter (Signed)
Please advise     KP  Last seen 02/05/14 and filled 02/05/14 #30 with 1. Please advise     KP

## 2014-03-03 ENCOUNTER — Encounter (HOSPITAL_BASED_OUTPATIENT_CLINIC_OR_DEPARTMENT_OTHER): Payer: Self-pay | Admitting: *Deleted

## 2014-03-03 ENCOUNTER — Telehealth: Payer: Self-pay | Admitting: Family Medicine

## 2014-03-03 MED ORDER — TRAMADOL HCL 50 MG PO TABS: 50.0000 mg | ORAL_TABLET | Freq: Four times a day (QID) | ORAL | Status: DC | PRN

## 2014-03-03 NOTE — Telephone Encounter (Signed)
Caryl Pina from [facility] called because patient states that a short supply of Tramadol was suppose to be sent to her local pharmacy as well. Please advise.

## 2014-03-03 NOTE — Progress Notes (Signed)
NPO AFTER MN. ARRIVE AT 1000. NEEDS ISTAT. CURRENT EKG IN CHART AND EPIC.  WILL TAKE GABAPENTIN, ATENOLOL, PRILOSEC, AND NORVASC AM DOS W/ SIPS OF WATER.

## 2014-03-03 NOTE — Telephone Encounter (Signed)
Rx for 60 faxed to [facility].     KP

## 2014-03-03 NOTE — Telephone Encounter (Signed)
Spoke with patient and she stated she never received the medication in Jan for the tramadol from [facility]. They told her the quantity was incorrect, however when they spoke with her today they did receive the medication that I sent yesterday but it will take a few days for her to get it and she is out of med's, she wanted an RX sent to the local pharmacy. Please advise    KP

## 2014-03-03 NOTE — Telephone Encounter (Signed)
Ok to send in 65

## 2014-03-06 ENCOUNTER — Encounter (HOSPITAL_BASED_OUTPATIENT_CLINIC_OR_DEPARTMENT_OTHER): Payer: Self-pay | Admitting: *Deleted

## 2014-03-06 ENCOUNTER — Ambulatory Visit (HOSPITAL_BASED_OUTPATIENT_CLINIC_OR_DEPARTMENT_OTHER): Payer: Medicare Other | Admitting: Anesthesiology

## 2014-03-06 ENCOUNTER — Encounter (HOSPITAL_BASED_OUTPATIENT_CLINIC_OR_DEPARTMENT_OTHER)
Admission: RE | Disposition: A | Discharge status: Home / Self Care | Payer: Self-pay | Source: Ambulatory Visit | Attending: Urology

## 2014-03-06 ENCOUNTER — Encounter (HOSPITAL_BASED_OUTPATIENT_CLINIC_OR_DEPARTMENT_OTHER): Payer: Medicare Other | Admitting: Anesthesiology

## 2014-03-06 ENCOUNTER — Ambulatory Visit (HOSPITAL_BASED_OUTPATIENT_CLINIC_OR_DEPARTMENT_OTHER)
Admission: RE | Admit: 2014-03-06 | Discharge: 2014-03-06 | Disposition: A | Discharge status: Home / Self Care | Payer: Medicare Other | Source: Ambulatory Visit | Attending: Urology | Admitting: Urology

## 2014-03-06 DIAGNOSIS — IMO0002 Reserved for concepts with insufficient information to code with codable children: Secondary | ICD-10-CM | POA: Insufficient documentation

## 2014-03-06 DIAGNOSIS — F411 Generalized anxiety disorder: Secondary | ICD-10-CM | POA: Insufficient documentation

## 2014-03-06 DIAGNOSIS — A498 Other bacterial infections of unspecified site: Secondary | ICD-10-CM | POA: Insufficient documentation

## 2014-03-06 DIAGNOSIS — Z923 Personal history of irradiation: Secondary | ICD-10-CM | POA: Insufficient documentation

## 2014-03-06 DIAGNOSIS — N8111 Cystocele, midline: Secondary | ICD-10-CM | POA: Insufficient documentation

## 2014-03-06 DIAGNOSIS — K449 Diaphragmatic hernia without obstruction or gangrene: Secondary | ICD-10-CM | POA: Insufficient documentation

## 2014-03-06 DIAGNOSIS — K219 Gastro-esophageal reflux disease without esophagitis: Secondary | ICD-10-CM | POA: Insufficient documentation

## 2014-03-06 DIAGNOSIS — Z853 Personal history of malignant neoplasm of breast: Secondary | ICD-10-CM | POA: Insufficient documentation

## 2014-03-06 DIAGNOSIS — R350 Frequency of micturition: Secondary | ICD-10-CM | POA: Insufficient documentation

## 2014-03-06 DIAGNOSIS — R3915 Urgency of urination: Secondary | ICD-10-CM | POA: Insufficient documentation

## 2014-03-06 DIAGNOSIS — N390 Urinary tract infection, site not specified: Secondary | ICD-10-CM | POA: Insufficient documentation

## 2014-03-06 DIAGNOSIS — N816 Rectocele: Secondary | ICD-10-CM | POA: Insufficient documentation

## 2014-03-06 DIAGNOSIS — E7800 Pure hypercholesterolemia, unspecified: Secondary | ICD-10-CM | POA: Insufficient documentation

## 2014-03-06 DIAGNOSIS — IMO0001 Reserved for inherently not codable concepts without codable children: Secondary | ICD-10-CM | POA: Insufficient documentation

## 2014-03-06 DIAGNOSIS — N949 Unspecified condition associated with female genital organs and menstrual cycle: Secondary | ICD-10-CM | POA: Insufficient documentation

## 2014-03-06 DIAGNOSIS — I2510 Atherosclerotic heart disease of native coronary artery without angina pectoris: Secondary | ICD-10-CM | POA: Insufficient documentation

## 2014-03-06 DIAGNOSIS — G4733 Obstructive sleep apnea (adult) (pediatric): Secondary | ICD-10-CM | POA: Insufficient documentation

## 2014-03-06 DIAGNOSIS — N362 Urethral caruncle: Secondary | ICD-10-CM | POA: Insufficient documentation

## 2014-03-06 DIAGNOSIS — N3010 Interstitial cystitis (chronic) without hematuria: Secondary | ICD-10-CM

## 2014-03-06 DIAGNOSIS — Z90710 Acquired absence of both cervix and uterus: Secondary | ICD-10-CM | POA: Insufficient documentation

## 2014-03-06 DIAGNOSIS — Z79899 Other long term (current) drug therapy: Secondary | ICD-10-CM | POA: Insufficient documentation

## 2014-03-06 HISTORY — DX: Headache: R51

## 2014-03-06 HISTORY — DX: Personal history of other diseases of the digestive system: Z87.19

## 2014-03-06 HISTORY — DX: Pelvic and perineal pain: R10.2

## 2014-03-06 HISTORY — DX: Other constipation: K59.09

## 2014-03-06 HISTORY — PX: CYSTOSCOPY WITH HYDRODISTENSION AND BIOPSY: SHX5127

## 2014-03-06 HISTORY — DX: Personal history of other malignant neoplasm of skin: Z85.828

## 2014-03-06 HISTORY — DX: Personal history of other specified conditions: Z87.898

## 2014-03-06 HISTORY — DX: Headache, unspecified: R51.9

## 2014-03-06 HISTORY — DX: Personal history of other diseases of the respiratory system: Z87.09

## 2014-03-06 HISTORY — DX: Frequency of micturition: R35.0

## 2014-03-06 HISTORY — DX: Chronic obstructive pulmonary disease, unspecified: J44.9

## 2014-03-06 HISTORY — DX: Urgency of urination: R39.15

## 2014-03-06 HISTORY — DX: Personal history of malignant neoplasm of breast: Z85.3

## 2014-03-06 HISTORY — DX: Pelvic and perineal pain unspecified side: R10.20

## 2014-03-06 HISTORY — DX: Other specified postprocedural states: Z98.890

## 2014-03-06 LAB — POCT I-STAT, CHEM 8
BUN: 17 mg/dL (ref 6–23)
CHLORIDE: 101 meq/L (ref 96–112)
Calcium, Ion: 1.24 mmol/L (ref 1.13–1.30)
Creatinine, Ser: 1.2 mg/dL — ABNORMAL HIGH (ref 0.50–1.10)
GLUCOSE: 73 mg/dL (ref 70–99)
HEMATOCRIT: 35 % — AB (ref 36.0–46.0)
Hemoglobin: 11.9 g/dL — ABNORMAL LOW (ref 12.0–15.0)
Potassium: 2.9 mEq/L — CL (ref 3.7–5.3)
SODIUM: 144 meq/L (ref 137–147)
TCO2: 32 mmol/L (ref 0–100)

## 2014-03-06 SURGERY — CYSTOSCOPY, WITH BLADDER HYDRODISTENSION AND BIOPSY
Anesthesia: General | Site: Bladder

## 2014-03-06 MED ORDER — LIDOCAINE HCL (CARDIAC) 20 MG/ML IV SOLN
INTRAVENOUS | Status: DC | PRN
  Administered 2014-03-06: 80 mg via INTRAVENOUS

## 2014-03-06 MED ORDER — URELLE 81 MG PO TABS
1.0000 | ORAL_TABLET | Freq: Four times a day (QID) | ORAL | Status: DC
  Administered 2014-03-06: 81 mg via ORAL
  Filled 2014-03-06: qty 1

## 2014-03-06 MED ORDER — PROMETHAZINE HCL 25 MG/ML IJ SOLN
6.2500 mg | INTRAMUSCULAR | Status: DC | PRN
  Filled 2014-03-06: qty 1

## 2014-03-06 MED ORDER — FENTANYL CITRATE 0.05 MG/ML IJ SOLN
25.0000 ug | INTRAMUSCULAR | Status: DC | PRN
Start: 2014-03-06 — End: 2014-03-06
  Filled 2014-03-06: qty 1

## 2014-03-06 MED ORDER — STERILE WATER FOR IRRIGATION IR SOLN
Status: DC | PRN
  Administered 2014-03-06: 3000 mL

## 2014-03-06 MED ORDER — KETOROLAC TROMETHAMINE 30 MG/ML IJ SOLN
INTRAMUSCULAR | Status: DC | PRN
  Administered 2014-03-06: 30 mg via INTRAVENOUS

## 2014-03-06 MED ORDER — PROPOFOL 10 MG/ML IV BOLUS
INTRAVENOUS | Status: DC | PRN
  Administered 2014-03-06: 160 mg via INTRAVENOUS

## 2014-03-06 MED ORDER — MIDAZOLAM HCL 2 MG/2ML IJ SOLN
INTRAMUSCULAR | Status: AC
  Filled 2014-03-06: qty 2

## 2014-03-06 MED ORDER — LACTATED RINGERS IV SOLN
INTRAVENOUS | Status: DC
  Filled 2014-03-06: qty 1000

## 2014-03-06 MED ORDER — MIDAZOLAM HCL 5 MG/5ML IJ SOLN
INTRAMUSCULAR | Status: DC | PRN
  Administered 2014-03-06: 1 mg via INTRAVENOUS

## 2014-03-06 MED ORDER — MEPERIDINE HCL 25 MG/ML IJ SOLN
6.2500 mg | INTRAMUSCULAR | Status: DC | PRN
  Filled 2014-03-06: qty 1

## 2014-03-06 MED ORDER — PHENAZOPYRIDINE HCL 200 MG PO TABS
ORAL | Status: DC | PRN
  Administered 2014-03-06: 11:00:00 via INTRAVESICAL

## 2014-03-06 MED ORDER — URELLE 81 MG PO TABS: 1.0000 | ORAL_TABLET | Freq: Three times a day (TID) | ORAL | Status: DC

## 2014-03-06 MED ORDER — CIPROFLOXACIN IN D5W 400 MG/200ML IV SOLN
400.0000 mg | INTRAVENOUS | Status: AC
  Administered 2014-03-06 (×2): 400 mg via INTRAVENOUS
  Filled 2014-03-06: qty 200

## 2014-03-06 MED ORDER — TRIMETHOPRIM 100 MG PO TABS: 100.0000 mg | ORAL_TABLET | ORAL | Status: DC

## 2014-03-06 MED ORDER — FENTANYL CITRATE 0.05 MG/ML IJ SOLN
INTRAMUSCULAR | Status: AC
  Filled 2014-03-06: qty 6

## 2014-03-06 MED ORDER — LACTATED RINGERS IV SOLN
INTRAVENOUS | Status: DC
  Administered 2014-03-06: 11:00:00 via INTRAVENOUS
  Filled 2014-03-06: qty 1000

## 2014-03-06 MED ORDER — ONDANSETRON HCL 4 MG/2ML IJ SOLN
INTRAMUSCULAR | Status: DC | PRN
  Administered 2014-03-06: 4 mg via INTRAVENOUS

## 2014-03-06 MED ORDER — ACETAMINOPHEN 10 MG/ML IV SOLN
INTRAVENOUS | Status: DC | PRN
  Administered 2014-03-06: 1000 mg via INTRAVENOUS

## 2014-03-06 MED ORDER — PENTOSAN POLYSULFATE SODIUM 100 MG PO CAPS: 100.0000 mg | ORAL_CAPSULE | Freq: Three times a day (TID) | ORAL | Status: DC

## 2014-03-06 MED ORDER — URELLE 81 MG PO TABS
ORAL_TABLET | ORAL | Status: AC
  Filled 2014-03-06: qty 1

## 2014-03-06 MED ORDER — FENTANYL CITRATE 0.05 MG/ML IJ SOLN
INTRAMUSCULAR | Status: DC | PRN
  Administered 2014-03-06: 100 ug via INTRAVENOUS

## 2014-03-06 SURGICAL SUPPLY — 29 items
ADAPTER CATH WHT DISP STRL (CATHETERS) IMPLANT
BAG DRAIN URO-CYSTO SKYTR STRL (DRAIN) ×2 IMPLANT
BOOTIES KNEE HIGH SLOAN (MISCELLANEOUS) ×2 IMPLANT
CANISTER SUCT LVC 12 LTR MEDI- (MISCELLANEOUS) ×2 IMPLANT
CATH ROBINSON RED A/P 16FR (CATHETERS) IMPLANT
CATH SILICONE 5CC 18FR (INSTRUMENTS) ×2 IMPLANT
CLOTH BEACON ORANGE TIMEOUT ST (SAFETY) ×2 IMPLANT
DRAPE CAMERA CLOSED 9X96 (DRAPES) ×2 IMPLANT
ELECT REM PT RETURN 9FT ADLT (ELECTROSURGICAL) ×2
ELECTRODE REM PT RTRN 9FT ADLT (ELECTROSURGICAL) ×1 IMPLANT
GLOVE BIO SURGEON STRL SZ7.5 (GLOVE) ×2 IMPLANT
GLOVE BIOGEL PI IND STRL 6.5 (GLOVE) ×2 IMPLANT
GLOVE BIOGEL PI INDICATOR 6.5 (GLOVE) ×2
GLOVE ECLIPSE 8.0 STRL XLNG CF (GLOVE) IMPLANT
GLOVE SKINSENSE NS SZ6.5 (GLOVE) ×1
GLOVE SKINSENSE STRL SZ6.5 (GLOVE) ×1 IMPLANT
GOWN PREVENTION PLUS LG XLONG (DISPOSABLE) IMPLANT
GOWN STRL REIN XL XLG (GOWN DISPOSABLE) IMPLANT
GOWN STRL REUS W/TWL LRG LVL3 (GOWN DISPOSABLE) ×2 IMPLANT
GOWN STRL REUS W/TWL XL LVL3 (GOWN DISPOSABLE) ×2 IMPLANT
NDL SAFETY ECLIPSE 18X1.5 (NEEDLE) ×1 IMPLANT
NEEDLE HYPO 18GX1.5 SHARP (NEEDLE) ×1
NEEDLE HYPO 22GX1.5 SAFETY (NEEDLE) IMPLANT
NEEDLE SPNL 22GX7 QUINCKE BK (NEEDLE) ×2 IMPLANT
NS IRRIG 500ML POUR BTL (IV SOLUTION) IMPLANT
PACK CYSTOSCOPY (CUSTOM PROCEDURE TRAY) ×2 IMPLANT
SYR 20CC LL (SYRINGE) ×4 IMPLANT
SYR BULB IRRIGATION 50ML (SYRINGE) IMPLANT
WATER STERILE IRR 3000ML UROMA (IV SOLUTION) ×2 IMPLANT

## 2014-03-06 NOTE — H&P (Signed)
ason For Visit Seen today for a post hospital f/u.   Active Problems Problems  1. Urinary tract infection (599.0)   Assessed By: Jimmey Ralph (Urology); Last Assessed: 17 Feb 2014  History of Present Illness 68 YO female [name] seen today for a post hospital f/u. Was admitted 2/4-8/15 for urosepsis [facility]. Had (+) E coli UTI treated with both IV and oral ABX. Is scheduled for cysto/HOD, bladder BX, and bladder injection of Marcaine, Kenalog, and Pyridium 03/05/14. Had E coli UTI 02/05/14 treated with SMZ-TMP DS 1po BID X 7 days.    GU HX:    Referred by Dr. Toney Rakes for further evaluation of IC. She is a pt of Dr. Jana Hakim for Right sided intraductal breast cancer, Rx lumpectomy and Radiation, ad then Tamoxifen ( rash, vomiting, h/a, so stopped). Being off estrogen has really caused her weight gain ( 14 lbs), hot flashes, eating only "junk"; depression after mother died 2ndary pancreatic cancer 3 months ago. S/P Hysterectomy 1981, P3-3-0.      Pt noted abdominal bloating and constipation 10/22/13. She had pelvic exam by GI with finding of possible vaginal mass, and she was referred to Dr. Toney Rakes for evaluation. Hx of endocervical cancer cells 1992, Rx 5-FU injections.   + dyspaurnea. PUFF score=26, with significant pelvic pain, urge, frequency.    Interval HX:  Today states overall she feels ok but still has dysuria and bladder pain. Has not resumed vaginal HRT as Rx'd. Denies f/c or hematuria.   Past Medical History Problems  1. History of Anxiety (300.00) 2. History of arthritis (V13.4) 3. History of breast cancer (V10.3) 4. History of cardiac arrhythmia (V12.59) 5. History of cardiac murmur (V12.59) 6. History of depression (V11.8) 7. History of gastroesophageal reflux (GERD) (V12.79) 8. History of hypercholesterolemia (V12.29) 9. History of hypertension (V12.59) 10. History of Obstructive sleep apnea, adult (327.23)  Surgical  History Problems  1. History of Adenoidectomy 2. History of Breast Surgery Lumpectomy 3. History of Foot Surgery 4. History of Hand Surgery 5. History of Hysterectomy 6. History of Nose Surgery 7. History of Tonsillectomy  Current Meds 1. AmLODIPine Besylate 5 MG Oral Tablet;  Therapy: (Recorded:03Feb2015) to Recorded 2. Atenolol 50 MG Oral Tablet;  Therapy: (Recorded:03Feb2015) to Recorded 3. Calcium + D TABS;  Therapy: (Recorded:03Feb2015) to Recorded 4. Ciprofloxacin 500 MG TABS;  Therapy: (Recorded:03Feb2015) to Recorded 5. Crestor 10 MG Oral Tablet;  Therapy: (Recorded:03Feb2015) to Recorded 6. Cymbalta 60 MG Oral Capsule Delayed Release Particles;  Therapy: (Recorded:03Feb2015) to Recorded 7. Fluticasone Propionate 50 MCG/ACT Nasal Suspension;  Therapy: (Recorded:03Feb2015) to Recorded 8. Furosemide 20 MG Oral Tablet;  Therapy: (Recorded:03Feb2015) to Recorded 9. Gabapentin 300 MG Oral Capsule;  Therapy: (Recorded:03Feb2015) to Recorded 10. Ibuprofen 200 MG Oral Capsule;   Therapy: (Recorded:03Feb2015) to Recorded 11. Imipramine HCl - 25 MG Oral Tablet;   Therapy: (Recorded:03Feb2015) to Recorded 12. Lidocaine 5 % External Ointment;   Therapy: (Recorded:03Feb2015) to Recorded 13. MiraLax Oral Powder;   Therapy: (Recorded:03Feb2015) to Recorded 14. Multiple Vitamin TABS;   Therapy: (Recorded:03Feb2015) to Recorded 15. Omeprazole 40 MG Oral Capsule Delayed Release;   Therapy: (Recorded:03Feb2015) to Recorded 16. Potassium Chloride Crys ER 20 MEQ Oral Tablet Extended Release;   Therapy: (Recorded:03Feb2015) to Recorded 17. Probiotic Oral Capsule;   Therapy: (Recorded:03Feb2015) to Recorded 18. Stool Softener CAPS;   Therapy: (Recorded:03Feb2015) to Recorded 19. TraMADol HCl - 50 MG Oral Tablet;   Therapy: (Recorded:03Feb2015) to Recorded 20. TraZODone HCl - 100 MG Oral Tablet;  Therapy: (Recorded:03Feb2015) to Recorded  Allergies Medication  1. Clindamycin  HCl CAPS 2. Codeine Derivatives 3. Penicillins 4. pentazocine 5. Pneumococcal Vaccines 6. PredniSONE (Pak) TABS Non-Medication  7. Latex  Family History Problems  1. Family history of cardiac disorder (V17.49) : Father 2. Family history of kidney stones (V18.69) : Mother 3. Family history of pancreatic cancer (V16.0) : Mother 4. Family history of stroke (V17.1) : Father 5. Family history of Hematuria : Mother  Social History Problems  1. Caffeine use (V49.89)   1 per day 2. Father deceased 3. Former smoker (V15.82)   smoked x 29yrs, quit 23yrs ago 4. Married 5. Mother deceased Sheri. No alcohol use 7. Number of children   1 son, 2 daughters 7. Occupation   Agricultural engineer  Review of Systems Genitourinary, constitutional, skin, eye, otolaryngeal, hematologic/lymphatic, cardiovascular, pulmonary, endocrine, musculoskeletal, gastrointestinal, neurological and psychiatric system(s) were reviewed and pertinent findings if present are noted.  Genitourinary: dysuria and suprapubic pain.    Vitals Vital Signs [Data Includes: Last 1 Day]  Recorded: 24Feb2015 01:03PM  Height: 5 ft 5 in Weight: 118 lb  BMI Calculated: 19.64 BSA Calculated: 1.58 Blood Pressure: 101 / 66, Sitting Temperature: 98.6 F, Oral Heart Rate: 75 Respiration: 20  Physical Exam Constitutional: Well nourished and well developed . No acute distress. The [name] appears well hydrated.  Abdomen: The abdomen is flat. The abdomen is soft and nontender. Mild suprapubic tenderness is present.  Skin: Normal skin turgor and normal skin color and pigmentation.  Neuro/Psych:. Mood and affect are appropriate.    Results/Data  Old records or history reviewed: Reviewed hospital records.  The following clinical lab reports were reviewed:  UA- negative. Will culture urine with upcoming surgical procedure. Selected Results  URINE CULTURE 24Feb2015 12:58PM [name], [name]  SOURCE : CLEAN CATCH SPECIMEN TYPE: URINE   Test  Name Result Flag Reference  CULTURE, URINE Culture, Urine    ===== COLONY COUNT: =====  NO GROWTH   FINAL REPORT: NO GROWTH   UA With REFLEX 24Feb2015 12:48PM Jimmey Ralph  SPECIMEN TYPE: CLEAN CATCH   Test Name Result Flag Reference  COLOR YELLOW  YELLOW  APPEARANCE CLEAR  CLEAR  SPECIFIC GRAVITY 1.010  1.005-1.030  pH 7.5  5.0-8.0  GLUCOSE NEG mg/dL  NEG  BILIRUBIN NEG  NEG  KETONE NEG mg/dL  NEG  BLOOD NEG  NEG  PROTEIN NEG mg/dL  NEG  UROBILINOGEN 0.2 mg/dL  0.0-1.0  NITRITE NEG  NEG  LEUKOCYTE ESTERASE NEG  NEG   Assessment Assessed  1. Urinary tract infection (599.0)  Plan  Health Maintenance  1. UA With REFLEX; [Do Not Release]; Status:Complete;   Done: [id number] 12:48PM Urinary tract infection  2. Start: Luvena Vaginal Moisturizer Vaginal Gel; USE AS DIRECTED  Culture urine. No ABX at this time since UA is negative. If culture positive will cover with appropriate ABX  Proceed with upcoming cysto/HOD w/Dr. Gaynelle Arabian   Will resume Vagifem as Rx'd by GYN  Will also begin [place]; Status:Resulted - Requires Verification;  Done: [id number] 12:00AM [id number]; Marked Important;Ordered; Today;  AST:[id number] interstitial cystitis without hematuria, Urinary tract infection; Ordered LN:LGXQJJ, [name];   Electronics engineer signed by : Jimmey Ralph, ANP-C; Feb 18 2014  3:24PM EST

## 2014-03-06 NOTE — Transfer of Care (Signed)
Immediate Anesthesia Transfer of Care Note  Patient: [name]  Procedure(s) Performed: Procedure(s) (LRB): CYSTOSCOPY/HYDRODISTENSION/ INSTILATION OF MARCAINE AND PYRIDIUM (N/A)  Patient Location: PACU  Anesthesia Type: General  Level of Consciousness: awake, oriented, sedated and patient cooperative  Airway & Oxygen Therapy: Patient Spontanous Breathing and Patient connected to face mask oxygen  Post-op Assessment: Report given to PACU RN and Post -op Vital signs reviewed and stable  Post vital signs: Reviewed and stable  Complications: No apparent anesthesia complications

## 2014-03-06 NOTE — Discharge Instructions (Addendum)
Interstitial Cystitis Interstitial cystitis (IC) is a condition that results in discomfort or pain in the bladder and the surrounding pelvic region. The symptoms can be different from case to case and even in the same individual. People may experience:  Mild discomfort.  Pressure.  Tenderness.  Intense pain in the bladder and pelvic area. CAUSES  Because IC varies so much in symptoms and severity, people studying this disease believe it is not one but several diseases. Some caregivers use the term painful bladder syndrome (PBS) to describe cases with painful urinary symptoms. This may not meet the strictest definition of IC. The term IC / PBS includes all cases of urinary pain that cannot be connected to other causes, such as infection or urinary stones.  SYMPTOMS  Symptoms may include:  An urgent need to urinate.  A frequent need to urinate.  A combination of these symptoms. Pain may change in intensity as the bladder fills with urine or as it empties. Women's symptoms often get worse during menstruation. They may sometimes experience pain with vaginal intercourse. Some of the symptoms of IC / PBS seem like those of bacterial infection. Tests do not show infection. IC / PBS is far more common in women than in men.  DIAGNOSIS  The diagnosis of IC / PBS is based on:  Presence of pain related to the bladder, usually along with problems of frequency and urgency.  Not finding other diseases that could cause the symptoms.  Diagnostic tests that help rule out other diseases include:  Urinalysis.  Urine culture.  Cystoscopy.  Biopsy of the bladder wall.  Distension of the bladder under anesthesia.  Urine cytology.  Laboratory examination of prostate secretions. A biopsy is a tissue sample that can be looked at under a microscope. Samples of the bladder and urethra may be removed during a cystoscopy. A biopsy helps rule out bladder cancer. TREATMENT  Scientists have not yet found  a cure for IC / PBS. Patients with IC / PBS do not get better with antibiotic therapy. Caregivers cannot predict who will respond best to which treatment. Symptoms may disappear without explanation. Disappearing symptoms may coincide with an event such as a change in diet or treatment. Even when symptoms disappear, they may return after days, weeks, months, or years.  Because the causes of IC / PBS are unknown, current treatments are aimed at relieving symptoms. Many people are helped by one or a combination of the treatments. As researchers learn more about IC / PBS, the list of potential treatments will change. Patients should discuss their options with a caregiver. SURGERY  Surgery should be considered only if all available treatments have failed and the pain is disabling. Many approaches and techniques are used. Each approach has its own advantages and complications. Advantages and complications should be discussed with a urologist. Your caregiver may recommend consulting another urologist for a second opinion. Most caregivers are reluctant to operate because the outcome is unpredictable. Some people still have symptoms after surgery.  People considering surgery should discuss the potential risks and benefits, side effects, and long- and short-term complications with their family, as well as with people who have already had the procedure. Surgery requires anesthesia, hospitalization, and in some cases weeks or months of recovery. As the complexity of the procedure increases, so do the chances for complications and for failure. HOME CARE INSTRUCTIONS   All drugs, even those sold over the counter, have side effects. Patients should always consult a caregiver before using any  drug for an extended amount of time. Only take over-the-counter or prescription medicines for pain, discomfort, or fever as directed by your caregiver.  Many patients feel that smoking makes their symptoms worse. How the by-products  of tobacco that are excreted in the urine affect IC / PBS is unknown. Smoking is the major known cause of bladder cancer. One of the best things smokers can do for their bladder and their overall health is to quit.  Many patients feel that gentle stretching exercises help relieve IC / PBS symptoms.  Methods vary, but basically patients decide to empty their bladder at designated times and use relaxation techniques and distractions to keep to the schedule. Gradually, patients try to lengthen the time between scheduled voids. A diary in which to record voiding times is usually helpful in keeping track of progress. MAKE SURE YOU:   Understand these instructions.  Will watch your condition.  Will get help right away if you are not doing well or get worse. Document Released: 08/11/2004 Document Revised: 03/04/2012 Document Reviewed: 10/26/2008 [facility] Patient Information 2014 [place], [place].  Post Anesthesia Home Care Instructions  Activity: Get plenty of rest for the remainder of the day. A responsible adult should stay with you for 24 hours following the procedure.  For the next 24 hours, DO NOT: -Drive a car -Paediatric nurse -Drink alcoholic beverages -Take any medication unless instructed by your physician -Make any legal decisions or sign important papers.  Meals: Start with liquid foods such as gelatin or soup. Progress to regular foods as tolerated. Avoid greasy, spicy, heavy foods. If nausea and/or vomiting occur, drink only clear liquids until the nausea and/or vomiting subsides. Call your physician if vomiting continues.  Special Instructions/Symptoms: Your throat may feel dry or sore from the anesthesia or the breathing tube placed in your throat during surgery. If this causes discomfort, gargle with warm salt water. The discomfort should disappear within 24 hours.

## 2014-03-06 NOTE — Anesthesia Procedure Notes (Signed)
Procedure Name: Intubation Date/Time: 03/06/2014 11:24 AM Performed by: Bethena Roys T Pre-anesthesia Checklist: Patient identified, Emergency Drugs available, Suction available and Patient being monitored Patient Re-evaluated:Patient Re-evaluated prior to inductionOxygen Delivery Method: Circle System Utilized Preoxygenation: Pre-oxygenation with 100% oxygen Intubation Type: IV induction Ventilation: Mask ventilation without difficulty Laryngoscope Size: Mac and 3 Grade View: Grade I Tube type: Oral Number of attempts: 1 Airway Equipment and Method: stylet and oral airway Placement Confirmation: ETT inserted through vocal cords under direct vision,  positive ETCO2 and breath sounds checked- equal and bilateral Secured at: 21 cm Tube secured with: Tape Dental Injury: Teeth and Oropharynx as per pre-operative assessment

## 2014-03-06 NOTE — Interval H&P Note (Signed)
History and Physical Interval Note:  03/06/2014 8:35 AM  [name]  has presented today for surgery, with the diagnosis of Urgency, Frequency, Pelvic Pain  The various methods of treatment have been discussed with the patient and family. After consideration of risks, benefits and other options for treatment, the patient has consented to  Procedure(s): CYSTOSCOPY/BIOPSY/HYDRODISTENSION (N/A) as a surgical intervention .  The patient's history has been reviewed, patient examined, no change in status, stable for surgery.  I have reviewed the patient's chart and labs.  Questions were answered to the patient's satisfaction.     Carolan Clines I

## 2014-03-06 NOTE — Anesthesia Preprocedure Evaluation (Addendum)
Anesthesia Evaluation    Airway Mallampati: II TM Distance: >3 FB Neck ROM: Full    Dental no notable dental hx.    Pulmonary asthma , COPDformer smoker,  breath sounds clear to auscultation  Pulmonary exam normal       Cardiovascular hypertension, Pt. on medications + CAD Rhythm:Regular Rate:Normal  hypertension, Pt. on medications and Pt. on home beta blockers + CAD (cath '11: mild non-obstructive ASCADz, EF >55%) Rhythm:Regular Rate:Normal  6/13 stress test: small (artifactual) fixed defect, no ischemia, EF 68%, low risk scan   Neuro/Psych    GI/Hepatic hiatal hernia, GERD-  Medicated and Poorly Controlled,  Endo/Other    Renal/GU      Musculoskeletal  (+) Fibromyalgia -  Abdominal   Peds  Hematology   Anesthesia Other Findings   Reproductive/Obstetrics                         Anesthesia Physical Anesthesia Plan  ASA: II  Anesthesia Plan: General   Post-op Pain Management:    Induction: Intravenous  Airway Management Planned: Oral ETT  Additional Equipment:   Intra-op Plan:   Post-operative Plan: Extubation in OR  Informed Consent: I have reviewed the patients History and Physical, chart, labs and discussed the procedure including the risks, benefits and alternatives for the proposed anesthesia with the patient or authorized representative who has indicated his/her understanding and acceptance.   Dental advisory given  Plan Discussed with: CRNA  Anesthesia Plan Comments:         Anesthesia Quick Evaluation                                  Anesthesia Evaluation  Patient identified by MRN, date of birth, ID band Patient awake    Reviewed: Allergy & Precautions, H&P , NPO status , Patient's Chart, lab work & pertinent test results, reviewed documented beta blocker date and time   History of Anesthesia Complications Negative for: history of anesthetic  complications  Airway Mallampati: I TM Distance: >3 FB Neck ROM: Full    Dental  (+) Teeth Intact and Dental Advisory Given   Pulmonary asthma , COPD COPD inhaler, Current Smoker,  breath sounds clear to auscultation  Pulmonary exam normal       Cardiovascular hypertension, Pt. on medications and Pt. 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 (from GERD?))  Medicated and Poorly Controlled,  Endo/Other  negative endocrine ROS  Renal/GU negative Renal ROS     Musculoskeletal   Abdominal   Peds  Hematology   Anesthesia Other Findings   Reproductive/Obstetrics                         Anesthesia Physical Anesthesia Plan  ASA: III  Anesthesia Plan: General   Post-op Pain Management:    Induction: Intravenous  Airway Management Planned: Oral ETT and Video Laryngoscope Planned  Additional Equipment:   Intra-op Plan:   Post-operative Plan:   Informed Consent: I have reviewed the patients History and Physical, chart, labs and discussed the procedure including the risks, benefits and alternatives for the proposed anesthesia with the patient  or authorized representative who has indicated his/her understanding and acceptance.   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 (from GERD?))  Medicated and Poorly Controlled,                                  Anesthesia Evaluation  Patient identified by MRN, date of birth, ID band Patient awake    Reviewed: Allergy  & Precautions, H&P , NPO status , Patient's Chart, lab work & pertinent test results, reviewed documented beta blocker date and time   History of Anesthesia Complications Negative for: history of anesthetic complications  Airway Mallampati: I TM Distance: >3 FB Neck ROM: Full    Dental  (+) Teeth Intact and Dental Advisory Given   Pulmonary asthma , COPD COPD inhaler, Current Smoker,  breath sounds clear to auscultation  Pulmonary exam normal       Cardiovascular hypertension, Pt. on medications and Pt. 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 (from GERD?))  Medicated and Poorly Controlled,  Endo/Other  negative endocrine ROS  Renal/GU negative Renal ROS     Musculoskeletal   Abdominal   Peds  Hematology   Anesthesia Other Findings   Reproductive/Obstetrics                         Anesthesia Physical Anesthesia Plan  ASA: III  Anesthesia Plan: General   Post-op Pain Management:    Induction: Intravenous  Airway Management Planned: Oral ETT and Video Laryngoscope Planned  Additional Equipment:   Intra-op Plan:   Post-operative Plan:   Informed Consent: I have reviewed the patients History and Physical, chart, labs and discussed the procedure including the risks, benefits and alternatives for the proposed anesthesia with the patient or authorized representative who has indicated his/her understanding and acceptance.   Dental advisory given  Plan Discussed with: CRNA and Surgeon  Anesthesia Plan Comments: (Plan routine monitors, GETA with VideoGlide intubation  )        Anesthesia Quick Evaluation                                    Anesthesia Evaluation  Patient identified by MRN, date of birth, ID band Patient awake    Reviewed: Allergy & Precautions, H&P , NPO status , Patient's Chart, lab work & pertinent test results, reviewed documented beta blocker date and time   History of Anesthesia Complications Negative for: history of anesthetic complications  Airway Mallampati: I TM Distance: >3 FB Neck ROM: Full    Dental  (+) Teeth Intact and Dental Advisory Given   Pulmonary asthma , COPD COPD inhaler, Current Smoker,  breath sounds clear to auscultation  Pulmonary exam normal       Cardiovascular hypertension, Pt. on medications and Pt. 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 (from GERD?))  Medicated and Poorly Controlled,  Endo/Other  negative endocrine ROS  Renal/GU negative Renal ROS     Musculoskeletal   Abdominal   Peds  Hematology   Anesthesia Other Findings   Reproductive/Obstetrics                         Anesthesia Physical Anesthesia Plan  ASA: III  Anesthesia Plan: General   Post-op Pain Management:    Induction: Intravenous  Airway Management Planned: Oral ETT and Video Laryngoscope Planned  Additional Equipment:   Intra-op Plan:   Post-operative Plan:   Informed Consent: I have reviewed the patients History and Physical, chart, labs and discussed the procedure including the risks, benefits and alternatives for the proposed anesthesia with the patient or authorized representative who has indicated his/her understanding and acceptance.   Dental advisory given  Plan Discussed with: CRNA and Surgeon  Anesthesia Plan Comments: (Plan routine monitors,  GETA with VideoGlide intubation  )        Anesthesia Quick Evaluation                                   Anesthesia Evaluation  Patient identified by MRN, date of birth, ID band Patient awake    Reviewed: Allergy & Precautions, H&P , NPO status , Patient's Chart, lab work & pertinent test results, reviewed documented beta blocker date and time   History of Anesthesia Complications Negative for: history of anesthetic complications  Airway Mallampati: I TM Distance: >3 FB Neck ROM: Full    Dental  (+) Teeth Intact and Dental Advisory Given   Pulmonary asthma , COPD COPD inhaler, Current Smoker,  breath sounds clear to auscultation  Pulmonary exam normal       Cardiovascular hypertension, Pt. on medications and Pt. 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 (from GERD?))  Medicated and Poorly Controlled,  Endo/Other  negative endocrine ROS  Renal/GU negative Renal ROS     Musculoskeletal   Abdominal   Peds  Hematology   Anesthesia Other Findings   Reproductive/Obstetrics                         Anesthesia Physical Anesthesia Plan  ASA: III  Anesthesia Plan: General   Post-op Pain Management:    Induction: Intravenous  Airway Management Planned: Oral ETT and Video Laryngoscope Planned  Additional Equipment:   Intra-op Plan:   Post-operative Plan:   Informed Consent: I have reviewed the patients History and Physical, chart, labs and discussed the procedure including the risks, benefits and alternatives for the proposed anesthesia with the patient or authorized representative who has indicated his/her understanding and acceptance.    Dental advisory given  Plan Discussed with: CRNA and Surgeon  Anesthesia Plan Comments: (Plan routine monitors, GETA with VideoGlide intubation  )        Anesthesia Quick Evaluation

## 2014-03-06 NOTE — Anesthesia Postprocedure Evaluation (Signed)
  Anesthesia Post-op Note  Patient: [name]  Procedure(s) Performed: Procedure(s) (LRB): CYSTOSCOPY/HYDRODISTENSION/ INSTILATION OF MARCAINE AND PYRIDIUM (N/A)  Patient Location: PACU  Anesthesia Type: General  Level of Consciousness: awake and alert   Airway and Oxygen Therapy: Patient Spontanous Breathing  Post-op Pain: mild  Post-op Assessment: Post-op Vital signs reviewed, Patient's Cardiovascular Status Stable, Respiratory Function Stable, Patent Airway and No signs of Nausea or vomiting  Last Vitals:  Filed Vitals:   03/06/14 1154  BP: 142/70  Pulse: 77  Temp: 37 C  Resp: 10    Post-op Vital Signs: stable   Complications: No apparent anesthesia complications

## 2014-03-06 NOTE — Op Note (Signed)
Pre-operative diagnosis :   IC With pelvic and urethral burning    Postoperative diagnosis:  Same + urethral caruncle  Operation:  Examination under anesthesia, cystourethroscopy, hydrodistention the bladder (450 cc to 650 cc), instillation of Pyridium and Marcaine in the bladder; injection of Marcaine in the subtrigonal space        Surgeon:  S. Gaynelle Arabian, MD  First assistant:  None  Anesthesia: General LMA  Preparation:  After appropriate preanesthesia, the patient was brought to the operative room, placed on the operating table in the dorsal supine position where general LMA anesthesia was introduced. She was replaced in the dorsal lithotomy position with pubis was prepped with Betadine solution and draped in usual fashion. The armband was double checked. History of the double-J. The allergies were double checked.  Review history:  Problems  1. Urinary tract infection (599.0)   Assessed By: Jimmey Ralph (Urology); Last Assessed: 17 Feb 2014  History of Present Illness  68 YO female patient of Dr. Arlyn Leak seen today for a post hospital f/u. Was admitted 2/4-8/15 for urosepsis [facility] Had (+) E coli UTI treated with both IV and oral ABX. Is scheduled for cysto/HOD, bladder BX, and bladder injection of Marcaine, Kenalog, and Pyridium 03/05/14. Had E coli UTI 02/05/14 treated with SMZ-TMP DS 1po BID X 7 days.  GU HX:  Referred by Dr. Toney Rakes for further evaluation of IC. She is a pt of Dr. Jana Hakim for Right sided intraductal breast cancer, Rx lumpectomy and Radiation, ad then Tamoxifen ( rash, vomiting, h/a, so stopped). Being off estrogen has really caused her weight gain ( 14 lbs), hot flashes, eating only "junk"; depression after mother died 2ndary pancreatic cancer 3 months ago. S/P Hysterectomy 1981, P3-3-0.  Pt noted abdominal bloating and constipation 10/22/13. She had pelvic exam by GI with finding of possible vaginal mass, and she was referred to Dr. Toney Rakes for  evaluation. Hx of endocervical cancer cells 1992, Rx 5-FU injections.  + dyspaurnea. PUFF score=26, with significant pelvic pain, urge, frequency.      Statement of  Likelihood of Success: Excellent. TIME-OUT observed.:  Procedure:  Examination anesthesia revealed that the patient has a urethral carbuncle. She has mild pelvic floor descent, with grade 1 cystocele and rectocele. There was no apical the stent, however.  Rigid cystoscopy was post with the 30 and 70 lenses. The trigone is in normal position, and clear reflux is seen from both cortices, located normally on the trigone. The bladder is hydrodistended from 450 cc to 650 cc. No cracking or bleeding was observed.  Pyridium solution is inserted in the bladder through a nonlatex catheter. Marcaine solution is injected into the subtrigonal space. No Kenalog is used because of history of cortisone allergy. The patient was awakened and taken to recovery room in good condition. She received IV Tylenol, and IV Toradol.

## 2014-03-09 ENCOUNTER — Encounter (HOSPITAL_BASED_OUTPATIENT_CLINIC_OR_DEPARTMENT_OTHER): Payer: Self-pay | Admitting: Urology

## 2014-03-11 ENCOUNTER — Telehealth: Payer: Self-pay

## 2014-03-11 ENCOUNTER — Other Ambulatory Visit: Payer: Self-pay

## 2014-03-11 DIAGNOSIS — M810 Age-related osteoporosis without current pathological fracture: Secondary | ICD-10-CM

## 2014-03-11 NOTE — Telephone Encounter (Signed)
Patient is due for a Bone Density Scan, last done 02/06/11, she did not have it done in 2014 due to Breast Cancer. The patient is due for Re-clast and has agreed to have her BMD done at [facility]. [facility].       KP

## 2014-04-16 ENCOUNTER — Ambulatory Visit: Payer: Medicare Other | Admitting: Cardiovascular Disease

## 2014-04-23 ENCOUNTER — Other Ambulatory Visit: Payer: Self-pay

## 2014-04-23 MED ORDER — OMEPRAZOLE 40 MG PO CPDR: 40.0000 mg | DELAYED_RELEASE_CAPSULE | Freq: Every morning | ORAL | Status: DC

## 2014-04-27 ENCOUNTER — Other Ambulatory Visit: Payer: Self-pay

## 2014-04-27 DIAGNOSIS — F329 Major depressive disorder, single episode, unspecified: Secondary | ICD-10-CM

## 2014-04-27 DIAGNOSIS — F32A Depression, unspecified: Secondary | ICD-10-CM

## 2014-04-27 MED ORDER — DULOXETINE HCL 60 MG PO CPEP: 60.0000 mg | ORAL_CAPSULE | Freq: Two times a day (BID) | ORAL | Status: DC

## 2014-04-27 NOTE — Telephone Encounter (Signed)
Rx sent      KP 

## 2014-05-22 DIAGNOSIS — G629 Polyneuropathy, unspecified: Secondary | ICD-10-CM | POA: Insufficient documentation

## 2014-05-22 DIAGNOSIS — R413 Other amnesia: Secondary | ICD-10-CM | POA: Insufficient documentation

## 2014-05-22 HISTORY — DX: Polyneuropathy, unspecified: G62.9

## 2014-05-26 ENCOUNTER — Other Ambulatory Visit: Payer: Self-pay | Admitting: Oncology

## 2014-05-26 ENCOUNTER — Other Ambulatory Visit: Payer: Self-pay | Admitting: *Deleted

## 2014-05-26 DIAGNOSIS — R928 Other abnormal and inconclusive findings on diagnostic imaging of breast: Secondary | ICD-10-CM

## 2014-05-26 DIAGNOSIS — C50911 Malignant neoplasm of unspecified site of right female breast: Secondary | ICD-10-CM

## 2014-05-26 DIAGNOSIS — Z853 Personal history of malignant neoplasm of breast: Secondary | ICD-10-CM

## 2014-05-26 DIAGNOSIS — C50519 Malignant neoplasm of lower-outer quadrant of unspecified female breast: Secondary | ICD-10-CM

## 2014-05-28 ENCOUNTER — Ambulatory Visit (INDEPENDENT_AMBULATORY_CARE_PROVIDER_SITE_OTHER): Payer: Medicare Other | Admitting: Cardiovascular Disease

## 2014-05-28 ENCOUNTER — Encounter: Payer: Self-pay | Admitting: Cardiovascular Disease

## 2014-05-28 VITALS — BP 109/68 | HR 73 | Ht 64.5 in | Wt 120.0 lb

## 2014-05-28 DIAGNOSIS — I2510 Atherosclerotic heart disease of native coronary artery without angina pectoris: Secondary | ICD-10-CM

## 2014-05-28 MED ORDER — ROSUVASTATIN CALCIUM 10 MG PO TABS: 10.0000 mg | ORAL_TABLET | Freq: Every evening | ORAL | Status: DC

## 2014-05-28 MED ORDER — ASPIRIN EC 81 MG PO TBEC: 81.0000 mg | DELAYED_RELEASE_TABLET | Freq: Every day | ORAL | Status: AC

## 2014-05-28 NOTE — Addendum Note (Signed)
Addended by: Thompson Grayer on: 05/28/2014 10:59 AM   Modules accepted: Orders

## 2014-05-28 NOTE — Progress Notes (Signed)
History of Present Illness: [name] is a 68 y.o. female who is here today for cardiac follow up. She has a history of non-obstructive CAD, COPD, hypothyroidism, GERD and hypertension. Left heart catheterization 07/2010: pLAD 40%, oLAD 30%, mLAD 30%, pRCA 30%, EF 60%. Last echo 10/2010: EF 55-60%, mild MR. She was admitted 6/10-6/13/13 secondary to acute respiratory failure in the setting of COPD exacerbation. The patient had hypotension and bradycardia and her atenolol was held. She c/o chest pain at the f/u visit in June 2013 so stress myoview was arranged. There was no ischemia. LVEF was 68%. There was question of anterior wall defect without ischemia. Echo was arranged to assess wall motion and this was normal. No RWMA. LVEF normal. She was found to have right breast cancer in August and she had a lumpectomy and 5 weeks of radiation which was completed in December 2013. She has had a mass removed from her vocal cords February 2014. She also fell and broke her foot around Christmas. I saw her 02/25/13 and she c/o feeling depressed with weight gain of 5 lbs. I started her on Lasix and potassium. Her Tamoxifen had been stopped. She was admitted to [facility] 01/28/14-02/01/14 with urosepsis.   She is here for cardiac follow up. She is feeling great. No chest pain or SOB. No LE edema.   Primary Care Physician: Garnet Koyanagi   Last Lipid Profile:Lipid Panel     Component Value Date/Time   CHOL 149 10/21/2013 1236   TRIG 112.0 10/21/2013 1236   HDL 46.10 10/21/2013 1236   CHOLHDL 3 10/21/2013 1236   VLDL 22.4 10/21/2013 1236   [place] 81 10/21/2013 1236     Past Medical History  Diagnosis Date  . GERD (gastroesophageal reflux disease)   . Arthritis   . Asthma   . Hypertension   . History of colonic polyps     BENIGN  . Fibromyalgia   . S/P radiation therapy 11/12/12 - 12/05/12    right Breast  . CAD (coronary artery disease) CARDIOLOGIST--  DR Haddon Fyfe    mild non-obstructive  cad  . COPD (chronic obstructive pulmonary disease)   . History of breast cancer ONCOLOGIST-- DR Jana Hakim---  NO RECURRANCE    DX 07/2012;  LOW GRADE DCIS  ER+PR+  ----  S/P RIGHT LUMPECTOMY WITH NEGATIVE MARGINS/   RADIATION ENDED 11/2012  . History of chronic bronchitis   . Pelvic pain   . Frequency of urination   . Urgency of urination   . Chronic constipation   . History of tachycardia     CONTROLLED  WITH ATENOLOL  . History of basal cell carcinoma excision     X2  . Sinus headache   . H/O hiatal hernia     Past Surgical History  Procedure Laterality Date  . Nasal sinus surgery  1985  . Cardiovascular stress test  06-18-2012  DR Reah Justo    LOW RISK NUCLEAR STUDY/  SMALL FIXED AREA OF MODERATELY DECREASED UPTAKE IN ANTEROSEPTAL WALL WHICH MAY BE ARTIFACTUAL/  NO ISCHEMIA/  EF 68%  . Transthoracic echocardiogram  06-24-2012    GRADE I DIASTOLIC DYSFUNCTION/  EF 55-60%/  MILD MR  . Right breast bx  08-23-2012  . Removal vocal cord cyst  FEB 2014  . Cardiac catheterization  09-13-2007  DR Lia Foyer    WELL-PRESERVED LVF/  DIFFUSE SCATTERED CORONARY CALCIFACATION AND ATHEROSCLEROSIS WITHOUT OBSTRUCTION  . Cardiac catheterization  08-04-2010  DR [facility]    NON-OBSTRUCTIVE CAD/  pLAD 40%/  oLAD 30%/  mLAD 30%/  pRCA 30%/  EF 60%  . Colonoscopy  09-29-2010  . Breast lumpectomy Right 10-11-2012    W/ SLN BX  . Total abdominal hysterectomy w/ bilateral salpingoophorectomy  1982    W/  APPENDECTOMY  . Tonsillectomy and adenoidectomy  AGE 16  . Orif right ankle  fx  2006  . Right hand surgery  X3  LAST ONE 2009    INCLUDES  ORIF RIGHT 5TH FINGER AND REVISION TWICE  . Cystoscopy with hydrodistension and biopsy N/A 03/06/2014    Procedure: CYSTOSCOPY/HYDRODISTENSION/ INSTILATION OF MARCAINE AND PYRIDIUM;  Surgeon: Ailene Rud, MD;  Location: [facility];  Service: Urology;  Laterality: N/A;    Current Outpatient Prescriptions  Medication Sig Dispense Refill  .  acetaminophen (TYLENOL) 500 MG tablet Take 500 mg by mouth every 6 (six) hours as needed.      Marland Kitchen albuterol (PROVENTIL HFA;VENTOLIN HFA) 108 (90 BASE) MCG/ACT inhaler Inhale 2 puffs into the lungs every 4 (four) hours as needed.  1 Inhaler  5  . amLODipine (NORVASC) 5 MG tablet TAKE 1 TABLET EVERY DAY---  TAKES IN AM, prn      . atenolol (TENORMIN) 50 MG tablet Take 50 mg by mouth every morning.      . DULoxetine (CYMBALTA) 60 MG capsule Take 1 capsule (60 mg total) by mouth 2 (two) times daily.  180 capsule  3  . estradiol (ESTRACE) 0.1 MG/GM vaginal cream Place 1 Applicatorful vaginally 2 (two) times a week. ALTERNATES WITH ESTRADIAL TABLET      . Estradiol 10 MCG TABS vaginal tablet Place 1 tablet (10 mcg total) vaginally 2 (two) times a week.  8 tablet  11  . fluticasone (FLONASE) 50 MCG/ACT nasal spray Place 1 spray into the nose as needed.       . furosemide (LASIX) 20 MG tablet Take 40 mg by mouth every morning.      . gabapentin (NEURONTIN) 300 MG capsule 300 mg 3 (three) times daily. 1-2 tabs TID      . imipramine (TOFRANIL) 25 MG tablet Take 1 tablet (25 mg total) by mouth at bedtime.  90 tablet  0  . Multiple Vitamin (MULTIVITAMIN) tablet Take 1 tablet by mouth daily.       Marland Kitchen omeprazole (PRILOSEC) 40 MG capsule Take 1 capsule (40 mg total) by mouth every morning.  90 capsule  1  . pentosan polysulfate (ELMIRON) 100 MG capsule Take 1 capsule (100 mg total) by mouth 3 (three) times daily before meals.  90 capsule  6  . potassium chloride SA (K-DUR,KLOR-CON) 20 MEQ tablet Take 1 tablet (20 mEq total) by mouth daily.  30 tablet  0  . Probiotic Product (ACIDOPHILUS PROBIOTIC COMPLEX PO) Take 1 capsule by mouth daily.      . rosuvastatin (CRESTOR) 10 MG tablet Take 10 mg by mouth every evening.      . traMADol (ULTRAM) 50 MG tablet Take 1-2 tablets (50-100 mg total) by mouth every 6 (six) hours as needed.  180 tablet  0  . traZODone (DESYREL) 100 MG tablet TAKE 2 TABLETS AT BEDTIME AS NEEDED  FOR SLEEP  180 tablet  1  . URELLE (URELLE/URISED) 81 MG TABS tablet Take 1 tablet (81 mg total) by mouth 3 (three) times daily.  30 each  2   No current facility-administered medications for this visit.   Facility-Administered Medications Ordered in Other Visits  Medication Dose Route Frequency  Provider Last Rate Last Dose  . bupivacaine (MARCAINE) 0.5 % 10 mL, triamcinolone acetonide (KENALOG-40) 40 mg injection   Subcutaneous Once Ailene Rud, MD      . bupivacaine (MARCAINE) 0.5 % 15 mL, phenazopyridine (PYRIDIUM) 400 mg bladder mixture   Bladder Instillation Once Ailene Rud, MD        Allergies  Allergen Reactions  . Clindamycin Hcl Shortness Of Breath and Rash  . Penicillins Anaphylaxis  . Prednisone Shortness Of Breath and Rash  . Codeine Hives and Other (See Comments)    headache  . Latex Hives  . Pentazocine Lactate Other (See Comments)    HALLUCINATION  . Pneumococcal Vaccine Polyvalent Hives, Swelling and Other (See Comments)    REACTION: redness, swelling, and hives at injection site  . Tamoxifen Nausea And Vomiting and Other (See Comments)    HEADACHE    History   Social History  . Marital Status: Married    Spouse Name: N/A    Number of Children: 3  . Years of Education: N/A   Occupational History  . Retired Therapist, sports    Social History Main Topics  . Smoking status: Former Smoker -- 1.00 packs/day for 15 years    Types: Cigarettes    Quit date: 05/27/2012  . Smokeless tobacco: Never Used  . Alcohol Use: Yes     Comment: occasional  . Drug Use: No  . Sexual Activity: Not on file   Other Topics Concern  . Not on file   Social History Narrative   Exercise-- 2days a week [employer],  Water aerobics, walking     Family History  Problem Relation Age of Onset  . Rectal cancer Mother   . Cancer Mother     rectal,  breast, pancreatic  . Irritable bowel syndrome Son   . Irritable bowel syndrome Daughter   . Heart disease Other   . Arthritis  Other   . Cancer Other     breast cancer 1st degree relative <50, lung  . Diabetes Other     1st degree relative   . Hyperlipidemia Other   . Hypertension Other   . Osteoporosis Other   . Coronary artery disease Other 65  . Cancer Maternal Aunt     breast    Review of Systems:  As stated in the HPI and otherwise negative.   BP 109/68  Pulse 73  Ht 5' 4.5" (1.638 m)  Wt 120 lb (54.432 kg)  BMI 20.29 kg/m2  Physical Examination: General: Well developed, well nourished, NAD HEENT: OP clear, mucus membranes moist SKIN: warm, dry. No rashes. Neuro: No focal deficits Musculoskeletal: Muscle strength 5/5 all ext Psychiatric: Mood and affect normal Neck: No JVD, no carotid bruits, no thyromegaly, no lymphadenopathy. Lungs:Clear bilaterally, no wheezes, rhonci, crackles Cardiovascular: Regular rate and rhythm. No murmurs, gallops or rubs. Abdomen:Soft. Bowel sounds present. Non-tender.  Extremities: No lower extremity edema. Pulses are 2 + in the bilateral DP/PT.  EKG: NSR, rate 73 bpm  Assessment and Plan:   1. CAD: Stable. She has mild CAD. Will continue current medical therapy.

## 2014-05-28 NOTE — Patient Instructions (Signed)
Your physician wants you to follow-up in:  12 months.  You will receive a reminder letter in the mail two months in advance. If you don't receive a letter, please call our office to schedule the follow-up appointment.   

## 2014-06-23 ENCOUNTER — Telehealth: Payer: Self-pay

## 2014-06-23 ENCOUNTER — Ambulatory Visit
Admission: RE | Admit: 2014-06-23 | Discharge: 2014-06-23 | Disposition: A | Discharge status: Home / Self Care | Payer: Medicare Other | Source: Ambulatory Visit | Attending: Oncology | Admitting: Oncology

## 2014-06-23 DIAGNOSIS — Z853 Personal history of malignant neoplasm of breast: Secondary | ICD-10-CM

## 2014-06-23 MED ORDER — FUROSEMIDE 20 MG PO TABS: 40.0000 mg | ORAL_TABLET | Freq: Every morning | ORAL | Status: DC

## 2014-06-23 NOTE — Telephone Encounter (Signed)
Ok to refill 

## 2014-06-23 NOTE — Telephone Encounter (Signed)
Last seen 02/05/14 BMP done: Notes Recorded by Rosalita Chessman, DO on 02/07/2014 at 12:27 PM Increase po fluids to help kidney function---- bun/cr , gfr otherewise normal   Please advise if refill is appropriate. Furosemide 20 mg BID.  Please advise     KP

## 2014-06-23 NOTE — Telephone Encounter (Signed)
Rx faxed to [facility]       KP

## 2014-06-24 ENCOUNTER — Encounter (INDEPENDENT_AMBULATORY_CARE_PROVIDER_SITE_OTHER): Payer: Self-pay | Admitting: Surgery

## 2014-06-24 ENCOUNTER — Ambulatory Visit (INDEPENDENT_AMBULATORY_CARE_PROVIDER_SITE_OTHER): Payer: Medicare Other | Admitting: Surgery

## 2014-06-24 VITALS — BP 126/72 | HR 80 | Temp 97.5°F | Ht 64.0 in | Wt 121.0 lb

## 2014-06-24 DIAGNOSIS — C50911 Malignant neoplasm of unspecified site of right female breast: Secondary | ICD-10-CM

## 2014-06-24 DIAGNOSIS — C50919 Malignant neoplasm of unspecified site of unspecified female breast: Secondary | ICD-10-CM

## 2014-06-24 NOTE — Progress Notes (Signed)
Re:   [name] DOB:   [birth year] MRN:   [id number]  ASSESSMENT AND PLAN: 1.  Right breast cancer, 6 o'clock  (Tis, N0)  DCIS (3.4 cm) on excisional biopsy 10/11/2012.  Anterior margin 0.15 cm.  ER - 100%, PR - 61%  Seeing Dr. Darnell Level. Magrinat and S. Squire.  Completed rad tx.  She tried Tamoxifen, but did not tolerate it.  She is on no anti-estrogen.  She will see me back in 6 months.  1a.  Pain in both breast - R>L.  But the right side is getting better since early after surgery.   1b.  7 mm nodule in right breast - thought to be benign. 1c.  Biopsy of right breast 09/04/2013 - benign treatment changes.  2.  HTN. 3.  GERD   Saw Dr. Deatra Ina, but she now manages this herself 4.  Depression 5.  Tachyarrhythmia, controlled on Atenolol  Saw Dr. Lauretta Grill 6.  CAD 7.  COPD/Asthma  Hospitalized in June 2013 for acute bronchitis, now released by Dr. Lamonte Sakai 8.  Fibromyalgia  Followed by Dr. Sabra Heck in [facility] 9.  Multiple benign skin lesions - Dr. Lorenza Cambridge 10.  Quit smoking in June 2013. 11.  Plans surgery on her right foot - 11/01/2013.  This did okay, but she is not going back. 12.  Bladder issues seen by Dr. Gaynelle Arabian - 03/06/2014 13.  "Sepsis" - hospitalized at [facility] in Feb 2015  Chief Complaint  Patient presents with  . Breast Cancer Long Term Follow Up   REFERRING PHYSICIAN: Garnet Koyanagi, DO  HISTORY OF PRESENT ILLNESS: [name] is a 68 y.o. (DOB: [birth year])  white female whose primary care physician is Sheri Koyanagi, DO and comes to me for follow up of a right breast lumpectomy for DCIS.  She is doing well.  She is being scheduled for a MRI - she had one a year ago.  She has dense breast on mammogram and this seems appropriate. Since I last saw her, she had surgery on her right foot (it doing okay, but she is not excited about it), she had a bladder procedure and has interstitial cystitis Gaynelle Arabian), and she has been seen for her fibromyalgia. She  suffered "sepsis" from a bladder infection, was hospitalized at [facility] for 1 week in February 2015, but she does not remember much of the hospitalization.  Breast History: She noticed a right nipple discharge which started last October, 2012.  She has had biopsies of both breast (3 on the right and 1 on the left) in the past.  She has 2 paternal aunts who had breast cancer (in their 62's), but no one on her maternal side has had breast cancer.  She had a hysterectomy and bilateral oophorectomy for endometriosis in 1981.  Then in 1991, she had cervical cancer treated by a Dr. Gay [name] in [place].  She has had no further problems.  She is on Premarin, which she has been on for years.  We have agreed to stop this until all the biopsies are in.  Mammogram - 07/24/2012 - [facility] - negative, dense breast Mammogram and [place] of breast - 06/12/2012 - [facility] - negative MRI breast - 07/29/2012 - two areas of concern in right breast, 7 o'clock and 9 o'clock Due to the findings on the MRI, she had 3 biopsies of her right breast by Dr. Theodosia Blender on 08/23/2012.  Two biopsies proved benign, but the biopsy labeled "breast, right, (2B)"  showed atypical ductal hyperplasia.  I plan to excise that area of the breast.   Current Outpatient Prescriptions  Medication Sig Dispense Refill  . acetaminophen (TYLENOL) 500 MG tablet Take 500 mg by mouth every 6 (six) hours as needed.      Marland Kitchen albuterol (PROVENTIL HFA;VENTOLIN HFA) 108 (90 BASE) MCG/ACT inhaler Inhale 2 puffs into the lungs every 4 (four) hours as needed.  1 Inhaler  5  . amLODipine (NORVASC) 5 MG tablet TAKE 1 TABLET EVERY DAY---  TAKES IN AM, prn      . aspirin EC 81 MG tablet Take 1 tablet (81 mg total) by mouth daily.  90 tablet  3  . atenolol (TENORMIN) 50 MG tablet Take 50 mg by mouth every morning.      . DULoxetine (CYMBALTA) 60 MG capsule Take 1 capsule (60 mg total) by mouth 2 (two) times daily.  180 capsule  3  . estradiol (ESTRACE) 0.1  MG/GM vaginal cream Place 1 Applicatorful vaginally 2 (two) times a week. ALTERNATES WITH ESTRADIAL TABLET      . Estradiol 10 MCG TABS vaginal tablet Place 1 tablet (10 mcg total) vaginally 2 (two) times a week.  8 tablet  11  . fluticasone (FLONASE) 50 MCG/ACT nasal spray Place 1 spray into the nose as needed.       . furosemide (LASIX) 20 MG tablet Take 2 tablets (40 mg total) by mouth every morning.  180 tablet  1  . gabapentin (NEURONTIN) 300 MG capsule 300 mg 3 (three) times daily. 1-2 tabs TID      . imipramine (TOFRANIL) 25 MG tablet Take 1 tablet (25 mg total) by mouth at bedtime.  90 tablet  0  . Multiple Vitamin (MULTIVITAMIN) tablet Take 1 tablet by mouth daily.       Marland Kitchen omeprazole (PRILOSEC) 40 MG capsule Take 1 capsule (40 mg total) by mouth every morning.  90 capsule  1  . pentosan polysulfate (ELMIRON) 100 MG capsule Take 1 capsule (100 mg total) by mouth 3 (three) times daily before meals.  90 capsule  6  . potassium chloride SA (K-DUR,KLOR-CON) 20 MEQ tablet Take 1 tablet (20 mEq total) by mouth daily.  30 tablet  0  . Probiotic Product (ACIDOPHILUS PROBIOTIC COMPLEX PO) Take 1 capsule by mouth daily.      . rosuvastatin (CRESTOR) 10 MG tablet Take 1 tablet (10 mg total) by mouth every evening.  90 tablet  3  . traMADol (ULTRAM) 50 MG tablet Take 1-2 tablets (50-100 mg total) by mouth every 6 (six) hours as needed.  180 tablet  0  . traZODone (DESYREL) 100 MG tablet TAKE 2 TABLETS AT BEDTIME AS NEEDED FOR SLEEP  180 tablet  1  . URELLE (URELLE/URISED) 81 MG TABS tablet Take 1 tablet (81 mg total) by mouth 3 (three) times daily.  30 each  2   No current facility-administered medications for this visit.   Facility-Administered Medications Ordered in Other Visits  Medication Dose Route Frequency Provider Last Rate Last Dose  . bupivacaine (MARCAINE) 0.5 % 10 mL, triamcinolone acetonide (KENALOG-40) 40 mg injection   Subcutaneous Once Ailene Rud, MD      . bupivacaine  (MARCAINE) 0.5 % 15 mL, phenazopyridine (PYRIDIUM) 400 mg bladder mixture   Bladder Instillation Once Ailene Rud, MD        Allergies  Allergen Reactions  . Clindamycin Hcl Shortness Of Breath and Rash  . Penicillins Anaphylaxis  . Prednisone Shortness  Of Breath and Rash  . Codeine Hives and Other (See Comments)    headache  . Latex Hives  . Pentazocine Lactate Other (See Comments)    HALLUCINATION  . Pneumococcal Vaccine Polyvalent Hives, Swelling and Other (See Comments)    REACTION: redness, swelling, and hives at injection site  . Tamoxifen Nausea And Vomiting and Other (See Comments)    HEADACHE    REVIEW OF SYSTEMS: Skin:  Just had a bunch of skin lesions burned by Dr. Allyson Sabal. Neurologic:  Headaches Feb 2014 - MRI negative. Cardiac:  HTN.  Had irregular heart rate, but controlled on Atenolol.  Had heart cath in 2011.  Followed by Dr. Julianne Handler. Pulmonary:  Hospitalized for bronchitis at [facility] in June 2013.  Quit smoking in June 2013. Gastrointestinal:  GERD.  Had colonoscopy in 2011.  Musculoskeletal:  Fibromyalgia, followed by Dr. Bennett Scrape. Sabra Heck, neurology in [facility].  SOCIAL and FAMILY HISTORY: Married. I took a cyst off her husband's back in the past. She has 3 children:  72 female, 65 and 37 female. Her mother died of Pancreatic cancer in 09-19-13.  PHYSICAL EXAM: BP 126/72  Pulse 80  Temp(Src) 97.5 F (36.4 C)  Ht 5\' 4"  (1.626 m)  Wt 121 lb (54.885 kg)  BMI 20.76 kg/m2  General: WN WF who is alert.Marland Kitchen  HEENT:  Pupils equal.   NECK:  Supple.  No thyroid mass. LYMPH NODES:  No cervical, supraclavicular, or axillary adenopathy. Breasts:  RIGHT - Her circumareolar incision is hard to see.  Scar at 3 o'clock She has several areas of tenderness, but there is no correlating mass.  LEFT:  No palpable mass or nodule.  No nipple discharge. She has some tenderness, but less than the right side.  UPPER EXTREMITIES:  No evidence of lymphedema.  DATA  REVIEWED: Mammogram (and biopsy) 06/23/2014 - benign, but dense breast.  Breast density "d".  She had a MRI on 08/11/2013.  She is scheduled for another MRI.  Alphonsa Overall, MD,  [facility], [address]  [place], [place]    [place] Phone:  [phone] FAX:  [phone]

## 2014-07-03 ENCOUNTER — Ambulatory Visit
Admission: RE | Admit: 2014-07-03 | Discharge: 2014-07-03 | Disposition: A | Discharge status: Home / Self Care | Payer: Medicare Other | Source: Ambulatory Visit | Attending: Oncology | Admitting: Oncology

## 2014-07-03 ENCOUNTER — Other Ambulatory Visit: Payer: Self-pay | Admitting: Oncology

## 2014-07-03 DIAGNOSIS — C50911 Malignant neoplasm of unspecified site of right female breast: Secondary | ICD-10-CM

## 2014-07-03 DIAGNOSIS — R928 Other abnormal and inconclusive findings on diagnostic imaging of breast: Secondary | ICD-10-CM

## 2014-07-03 DIAGNOSIS — C50519 Malignant neoplasm of lower-outer quadrant of unspecified female breast: Secondary | ICD-10-CM

## 2014-07-03 MED ORDER — GADOBENATE DIMEGLUMINE 529 MG/ML IV SOLN
11.0000 mL | Freq: Once | INTRAVENOUS | Status: AC | PRN
  Administered 2014-07-03: 11 mL via INTRAVENOUS

## 2014-07-03 MED ORDER — GADOBENATE DIMEGLUMINE 529 MG/ML IV SOLN: 20.0000 mL | Freq: Once | INTRAVENOUS | Status: DC | PRN

## 2014-07-05 ENCOUNTER — Other Ambulatory Visit: Payer: Self-pay | Admitting: Oncology

## 2014-07-05 DIAGNOSIS — C50919 Malignant neoplasm of unspecified site of unspecified female breast: Secondary | ICD-10-CM

## 2014-07-05 NOTE — Progress Notes (Unsigned)
I was called a Friday in July 10 by a radiologist Re: [name]. She had an MRI of the breast that day, given with gadolinium, and apparently a creatinine was obtained prior to the procedure, but not reviewed. According to the radiologist, whose name I did not write down, the patient's creatinine was calculated at approximately 35 cc per minute and therefore the dose of gadolinium should have been cut in half. Instead she received a full dose. He was accordingly concerned regarding the risk of nephrogenic systemic fibrosis developing.  I suggested the patient be referred to the emergency room for admission and hydration for the next 48 hours. When I checked on Sunday 07/05/2014 however I did not find of the patient's in the hospital so I called her at home. She tells me she was called by radiology and was told to drink a lot of fluid over the next couple of days. She tells me she is "drinking as much as I can".  I asked her to measure 2 quarts and to drink at least those 2 quarts today. I am going to bring her in tomorrow for fluids and recheck of her creatinine. I do not find any record of the creatinine obtained by radiology in [place] for me to review at this point.  The following is from "up-to-date": Risk - A separate issue is the magnitude of the risk after exposure to gadolinium, which varies by level of GFR. Most of the cases have been reported in chronic dialysis patients. The reported risk has ranged between 2.5 and 5 percent in studies of approximately 400 to 500 dialysis patients [41-43]. Cases have been reported in patients with AKI. The risk in individuals with eGFR of 15 to 59 mL/min per 1.73 m2 remains undefined, but cases have been reported [44]. The risk is felt to be low among patients with eGFR 15 to 29 ml/min per 1.73 m2 (ie, stage 4) and very low in individuals with eGFR 30 to 59 mL/min per 1.73 m2 (ie, stage 3) [45]. (See "Definition and staging of chronic kidney disease in adults",  section on 'GFR'.) There is some evidence that a dose-response relationship exists. This was illustrated in a report of 301 MRI studies with gadodiamide in dialysis patients: 207 were done with a double dose (0.2 mmol/kg) and 94 with a single dose (0.1 mmol/kg) [44]. All 12 NSF patients (8 chronic dialysis patients and 4 with acute hepatorenal syndrome) had received a double dose. The odds ratio for the development of NSF after a double versus single dose of gadodiamide was calculated at 22.3 (95% CI 1.3-378).    The risk of NSF after gadolinium administration has not been defined in patients who have an eGFR between 30 and 60 mL/min per 1.73 m2. As previously mentioned, almost all cases of NSF after gadolinium exposure have been reported in patients on dialysis or with advanced renal failure (eGFR <30 mL/min per 1.73 m2) [74]. At least two patients have been reported to have developed NSF with an eGFR between 30 and 60 mL/min per 1.73 m2, although the accuracy of these estimates have been called into question as they were performed in the setting of AKI [17,39]. The decision to administer gadolinium among patients with an eGFR between 30 and 60 mL/min per 1.73 m2 requires consideration of multiple factors including patient preference, the unknown risk of NSF with gadolinium exposure in a single individual, and the risk of arterial puncture, if conventional arteriography were performed. Nephropathy due to iodinated contrast  is also a concern, but at this level of baseline, renal function is rarely associated with irreversible injury. There is no consensus among the authors and reviewers of this topic. Some would use gadolinium-based contrast if indicated, while others would try to avoid its use, if possible. In general, it seems advisable to limit exposure of any patient with marginal renal function to the three gadolinium-based contrast agents deemed the highest risk  CLINICAL MANIFESTATIONS - Nephrogenic  systemic fibrosis (NSF) is characterized by skin involvement in all patients and systemic involvement in some [1]. Among patients with gadolinium exposure, the latent period between exposure and disease onset is usually two to four weeks [13,36]. However, the reported range is as short as two days and as long as 18 months [37]. Skin involvement - Skin disease in NSF typically presents as symmetrical, bilateral fibrotic indurated papules, plaques, or subcutaneous nodules that may or may not be erythematous [1,57,61]. In the majority of cases, the lesions first develop on the ankles, lower legs, feet, and hands and then move proximally to involve the thighs, forearms, and, less often, the trunk or buttocks. Common distribution patterns involve the ankles to below the knees, the mid-thighs, and the skin between the wrists and mid-upper arms, bilaterally [10,23]. Unusual distribution patterns involving the skin overlying the middle [14] and lower abdomen [10,23] have been reported. The head is spared [10,62]. The lesions are often preceded by edema and may initially be misdiagnosed as cellulitis. The edema usually resolves, and the involved skin retains a thickened and firm texture [1]. The skin may have a "cobblestone" [63], "woody" [1,4,10], or peau d' orange appearance (orange peel-like) (picture 3) [1,62]. The lesions may be pruritic and accompanied by sharp pain or a burning sensation [1,4,22]. Movement of the joints may be so limited by the fibrosis that flexibility is lost (picture 4A-B). There may be sclerodactyly or loss of skin appendages of the dorsum of the hands and lower extremities [4]. Unlike autoimmune sclerosing conditions, livedo reticularis is not a feature of NSF. Late in the disease, hyperpigmentation, hairlessness, and epidermal atrophy have been described [64]. Confluent dermal plaques, with thickening and hardening may also occur.

## 2014-07-06 ENCOUNTER — Ambulatory Visit (HOSPITAL_BASED_OUTPATIENT_CLINIC_OR_DEPARTMENT_OTHER): Payer: Medicare Other

## 2014-07-06 ENCOUNTER — Other Ambulatory Visit (HOSPITAL_BASED_OUTPATIENT_CLINIC_OR_DEPARTMENT_OTHER): Payer: Medicare Other

## 2014-07-06 ENCOUNTER — Other Ambulatory Visit: Payer: Self-pay | Admitting: *Deleted

## 2014-07-06 ENCOUNTER — Other Ambulatory Visit: Payer: Self-pay | Admitting: Oncology

## 2014-07-06 ENCOUNTER — Ambulatory Visit (HOSPITAL_BASED_OUTPATIENT_CLINIC_OR_DEPARTMENT_OTHER): Payer: Medicare Other | Admitting: Oncology

## 2014-07-06 DIAGNOSIS — D0590 Unspecified type of carcinoma in situ of unspecified breast: Secondary | ICD-10-CM

## 2014-07-06 DIAGNOSIS — C50919 Malignant neoplasm of unspecified site of unspecified female breast: Secondary | ICD-10-CM

## 2014-07-06 DIAGNOSIS — E860 Dehydration: Secondary | ICD-10-CM

## 2014-07-06 DIAGNOSIS — C50911 Malignant neoplasm of unspecified site of right female breast: Secondary | ICD-10-CM

## 2014-07-06 LAB — CBC WITH DIFFERENTIAL/PLATELET
BASO%: 0.5 % (ref 0.0–2.0)
BASOS ABS: 0 10*3/uL (ref 0.0–0.1)
EOS ABS: 0.2 10*3/uL (ref 0.0–0.5)
EOS%: 2.9 % (ref 0.0–7.0)
HEMATOCRIT: 33.3 % — AB (ref 34.8–46.6)
HEMOGLOBIN: 10.8 g/dL — AB (ref 11.6–15.9)
LYMPH%: 22.4 % (ref 14.0–49.7)
MCH: 28.7 pg (ref 25.1–34.0)
MCHC: 32.4 g/dL (ref 31.5–36.0)
MCV: 88.6 fL (ref 79.5–101.0)
MONO#: 0.5 10*3/uL (ref 0.1–0.9)
MONO%: 8.1 % (ref 0.0–14.0)
NEUT%: 66.1 % (ref 38.4–76.8)
NEUTROS ABS: 4.1 10*3/uL (ref 1.5–6.5)
PLATELETS: 214 10*3/uL (ref 145–400)
RBC: 3.76 10*6/uL (ref 3.70–5.45)
RDW: 14.7 % — ABNORMAL HIGH (ref 11.2–14.5)
WBC: 6.2 10*3/uL (ref 3.9–10.3)
lymph#: 1.4 10*3/uL (ref 0.9–3.3)

## 2014-07-06 LAB — COMPREHENSIVE METABOLIC PANEL (CC13)
ALK PHOS: 83 U/L (ref 40–150)
ALT: 13 U/L (ref 0–55)
AST: 18 U/L (ref 5–34)
Albumin: 3.6 g/dL (ref 3.5–5.0)
Anion Gap: 9 mEq/L (ref 3–11)
BUN: 16 mg/dL (ref 7.0–26.0)
CO2: 27 mEq/L (ref 22–29)
CREATININE: 1.1 mg/dL (ref 0.6–1.1)
Calcium: 9.1 mg/dL (ref 8.4–10.4)
Chloride: 102 mEq/L (ref 98–109)
GLUCOSE: 85 mg/dL (ref 70–140)
POTASSIUM: 3.5 meq/L (ref 3.5–5.1)
Sodium: 139 mEq/L (ref 136–145)
Total Bilirubin: 0.31 mg/dL (ref 0.20–1.20)
Total Protein: 6.6 g/dL (ref 6.4–8.3)

## 2014-07-06 MED ORDER — SODIUM CHLORIDE 0.9 % IV SOLN
2000.0000 mL | Freq: Once | INTRAVENOUS | Status: DC
  Administered 2014-07-06: 1000 mL via INTRAVENOUS

## 2014-07-06 NOTE — Progress Notes (Signed)
Per MD request this RN contacted pt for IV fluids today with lab check to follow up on elevated creatinine and administration of contrast for breast MRI.  Pt will come in today at 1230. Verbal orders obtained to accommodate above.

## 2014-07-06 NOTE — Progress Notes (Signed)
I asked [name] to come by the office today to further hydrate her and to recheck her creatinine. This has gone back to her baseline. I have alerted her as to the need to call me with any unusual symptoms or any skin changes. Otherwise we will resume normal followup in October. Incidentally her MRI of the breast was negative.

## 2014-07-06 NOTE — Patient Instructions (Signed)
Dehydration, Adult Dehydration is when you lose more fluids from the body than you take in. Vital organs like the kidneys, brain, and heart cannot function without a proper amount of fluids and salt. Any loss of fluids from the body can cause dehydration.  CAUSES   Vomiting.  Diarrhea.  Excessive sweating.  Excessive urine output.  Fever. SYMPTOMS  Mild dehydration  Thirst.  Dry lips.  Slightly dry mouth. Moderate dehydration  Very dry mouth.  Sunken eyes.  Skin does not bounce back quickly when lightly pinched and released.  Dark urine and decreased urine production.  Decreased tear production.  Headache. Severe dehydration  Very dry mouth.  Extreme thirst.  Rapid, weak pulse (more than 100 beats per minute at rest).  Cold hands and feet.  Not able to sweat in spite of heat and temperature.  Rapid breathing.  Blue lips.  Confusion and lethargy.  Difficulty being awakened.  Minimal urine production.  No tears. DIAGNOSIS  Your caregiver will diagnose dehydration based on your symptoms and your exam. Blood and urine tests will help confirm the diagnosis. The diagnostic evaluation should also identify the cause of dehydration. TREATMENT  Treatment of mild or moderate dehydration can often be done at home by increasing the amount of fluids that you drink. It is best to drink small amounts of fluid more often. Drinking too much at one time can make vomiting worse. Refer to the home care instructions below. Severe dehydration needs to be treated at the hospital where you will probably be given intravenous (IV) fluids that contain water and electrolytes. HOME CARE INSTRUCTIONS   Ask your caregiver about specific rehydration instructions.  Drink enough fluids to keep your urine clear or pale yellow.  Drink small amounts frequently if you have nausea and vomiting.  Eat as you normally do.  Avoid:  Foods or drinks high in sugar.  Carbonated  drinks.  Juice.  Extremely hot or cold fluids.  Drinks with caffeine.  Fatty, greasy foods.  Alcohol.  Tobacco.  Overeating.  Gelatin desserts.  Wash your hands well to avoid spreading bacteria and viruses.  Only take over-the-counter or prescription medicines for pain, discomfort, or fever as directed by your caregiver.  Ask your caregiver if you should continue all prescribed and over-the-counter medicines.  Keep all follow-up appointments with your caregiver. SEEK MEDICAL CARE IF:  You have abdominal pain and it increases or stays in one area (localizes).  You have a rash, stiff neck, or severe headache.  You are irritable, sleepy, or difficult to awaken.  You are weak, dizzy, or extremely thirsty. SEEK IMMEDIATE MEDICAL CARE IF:   You are unable to keep fluids down or you get worse despite treatment.  You have frequent episodes of vomiting or diarrhea.  You have blood or green matter (bile) in your vomit.  You have blood in your stool or your stool looks black and tarry.  You have not urinated in 6 to 8 hours, or you have only urinated a small amount of very dark urine.  You have a fever.  You faint. MAKE SURE YOU:   Understand these instructions.  Will watch your condition.  Will get help right away if you are not doing well or get worse. Document Released: 12/11/2005 Document Revised: 03/04/2012 Document Reviewed: 07/31/2011 ExitCare Patient Information 2015 [employer]. This information is not intended to replace advice given to you by your health care provider. Make sure you discuss any questions you have with your health care   provider.  

## 2014-07-10 ENCOUNTER — Ambulatory Visit: Payer: Medicare Other | Admitting: Family Medicine

## 2014-07-13 ENCOUNTER — Telehealth: Payer: Self-pay | Admitting: Family Medicine

## 2014-07-13 NOTE — Telephone Encounter (Addendum)
Caller name: Malia Relation to KC:[id number] Call back number: [phone] Pharmacy:  Reason for call: Patient called stating that she saw Dr Gaynelle Arabian (Urologist) and diagnosed her with third stage renal failure. He would like for Dr Etter Sjogren to refer her to  Nephrology. Please advise.

## 2014-07-14 NOTE — Telephone Encounter (Signed)
Spoke with the pt and she was aware of the provider been out of the office,and she was very understanding.  She stated that she knows she may have to wait until Dr. Etter Sjogren comes back.  She has an appt scheduled with Dr. Etter Sjogren on Wed (07/15/14),but is not sure if that appt will be cancelled or rescheduled.  Informed the pt if unable to see Dr. Etter Sjogren on Wed (07/15/14) then we can see if we can get her in to see one of the other providers to get the referral to Nephrology.  Pt agreed.//AB/CMA

## 2014-07-15 ENCOUNTER — Ambulatory Visit: Payer: Medicare Other | Admitting: Family Medicine

## 2014-07-16 ENCOUNTER — Encounter: Payer: Self-pay | Admitting: Family Medicine

## 2014-07-16 ENCOUNTER — Ambulatory Visit (INDEPENDENT_AMBULATORY_CARE_PROVIDER_SITE_OTHER): Payer: Medicare Other | Admitting: Family Medicine

## 2014-07-16 VITALS — BP 120/74 | HR 74 | Temp 98.0°F | Resp 16 | Wt 122.4 lb

## 2014-07-16 DIAGNOSIS — N289 Disorder of kidney and ureter, unspecified: Secondary | ICD-10-CM | POA: Insufficient documentation

## 2014-07-16 NOTE — Progress Notes (Signed)
   Subjective:    Patient ID: [name], female    DOB: 1946/03/25, 68 y.o.   MRN: [id number]  HPI Renal insufficiency- pt had MRI done on 7/10 and was given 'full dose of contrast'.  [place] radiology called within 2 hrs and told pt to increase water intake, avoid the heat and rest b/c she 'was borderline kidney failure'.  Oncology called pt on Sunday and told pt that she was supposed to be in the hospital for renal failure (GFR 35).  Dr Jana Hakim had her return for labs on Monday but Cr was back to baseline.  Pt saw Dr Gaynelle Arabian and indicates that he wants her to see Nephrology- no reason given and no note available.  Pt is nearly hysterical w/ worry.  Husband is also concerned.  They don't understand why she has 'all these specialists and no one is talking to one another'.   Review of Systems + fatigue, 'foggy headed'    Objective:   Physical Exam  Vitals reviewed. Constitutional: She is oriented to person, place, and time. She appears well-developed and well-nourished. No distress.  HENT:  Head: Normocephalic and atraumatic.  Cardiovascular: Normal rate, regular rhythm, normal heart sounds and intact distal pulses.   Pulmonary/Chest: Effort normal and breath sounds normal. No respiratory distress. She has no wheezes. She has no rales.  Musculoskeletal: She exhibits no edema.  Neurological: She is alert and oriented to person, place, and time.  Skin: Skin is warm and dry. No erythema.  Psychiatric:  Anxious, almost angry          Assessment & Plan:

## 2014-07-16 NOTE — Patient Instructions (Signed)
Follow up w/ Dr Etter Sjogren to discuss your ongoing care It is ok to ask Dr Gaynelle Arabian why he wants you to see Nephrology The good news is that your kidney function has been stable (with a few minor blips that returned to baseline) over the last year- no need to panic! It is ok to ask Dr Jana Hakim about your anemia Continue to drink plenty of fluids, avoid anti-inflammatories Call with any questions or concerns Hang in there!!  You're doing great!!

## 2014-07-16 NOTE — Progress Notes (Signed)
Pre visit review using our clinic review tool, if applicable. No additional management support is needed unless otherwise documented below in the visit note. 

## 2014-07-19 NOTE — Assessment & Plan Note (Addendum)
New.  Reviewed almost 2 yrs worth of labs w/ pt.  The missing lab is the one that pt reports 'should've put me in the hospital'.  Reassured pt that her Cr is stable over 2 yrs and her renal function is at baseline- no need for hospitalization.  Since 2 separate providers have mentioned her seeing nephrology, will put in referral but stressed to them that there is a triage process at nephrology and likely they will not be seen anytime soon w/ virtually normal Cr.  Also stressed that while I appreciate her concern and passion for understanding her health and tx, she needs to discuss her concerns w/ the appropriate people- asking those who make the confusing statements what they mean or why they would like to her see someone else- that I cannot provide these answers for her.  Reviewed med list for possibly renal toxic meds- none apparent w/ exception of lasix.  Reviewed need to refrain from NSAIDs.  Discussed her fatigue and 'foggy headed' feeling could be due to any # of medications on her list- tramadol, trazodone, neurontin, imipramine- and she should discuss this w/ her regular providers.  Also encouraged her to speak to oncology about her ongoing anemia as a possible cause of her fatigue.  Total time spent w/ pt: over 40 min, >50% spent counseling

## 2014-07-20 ENCOUNTER — Encounter: Payer: Self-pay | Admitting: Family Medicine

## 2014-07-27 ENCOUNTER — Ambulatory Visit (INDEPENDENT_AMBULATORY_CARE_PROVIDER_SITE_OTHER): Payer: Medicare Other | Admitting: Family Medicine

## 2014-07-27 ENCOUNTER — Encounter: Payer: Self-pay | Admitting: Family Medicine

## 2014-07-27 VITALS — BP 116/74 | HR 92 | Temp 97.9°F | Wt 122.0 lb

## 2014-07-27 DIAGNOSIS — E785 Hyperlipidemia, unspecified: Secondary | ICD-10-CM

## 2014-07-27 DIAGNOSIS — M7021 Olecranon bursitis, right elbow: Secondary | ICD-10-CM

## 2014-07-27 DIAGNOSIS — R413 Other amnesia: Secondary | ICD-10-CM

## 2014-07-27 DIAGNOSIS — K2100 Gastro-esophageal reflux disease with esophagitis, without bleeding: Secondary | ICD-10-CM

## 2014-07-27 DIAGNOSIS — N289 Disorder of kidney and ureter, unspecified: Secondary | ICD-10-CM

## 2014-07-27 DIAGNOSIS — M7020 Olecranon bursitis, unspecified elbow: Secondary | ICD-10-CM

## 2014-07-27 LAB — LIPID PANEL
CHOL/HDL RATIO: 4
Cholesterol: 157 mg/dL (ref 0–200)
HDL: 41 mg/dL (ref >39.00)
LDL Cholesterol: 78 mg/dL (ref 0–99)
NonHDL: 116
Triglycerides: 191 mg/dL — ABNORMAL HIGH (ref 0.0–149.0)
VLDL: 38.2 mg/dL (ref 0.0–40.0)

## 2014-07-27 LAB — CBC WITH DIFFERENTIAL/PLATELET
BASOS PCT: 0.3 % (ref 0.0–3.0)
Basophils Absolute: 0 10*3/uL (ref 0.0–0.1)
Eosinophils Absolute: 0.2 10*3/uL (ref 0.0–0.7)
Eosinophils Relative: 3.2 % (ref 0.0–5.0)
HEMATOCRIT: 35.9 % — AB (ref 36.0–46.0)
HEMOGLOBIN: 11.6 g/dL — AB (ref 12.0–15.0)
LYMPHS PCT: 25.1 % (ref 12.0–46.0)
Lymphs Abs: 1.6 10*3/uL (ref 0.7–4.0)
MCHC: 32.3 g/dL (ref 30.0–36.0)
MCV: 88.7 fl (ref 78.0–100.0)
Monocytes Absolute: 0.5 10*3/uL (ref 0.1–1.0)
Monocytes Relative: 8.1 % (ref 3.0–12.0)
Neutro Abs: 4 10*3/uL (ref 1.4–7.7)
Neutrophils Relative %: 63.3 % (ref 43.0–77.0)
Platelets: 260 10*3/uL (ref 150.0–400.0)
RBC: 4.04 Mil/uL (ref 3.87–5.11)
RDW: 16 % — ABNORMAL HIGH (ref 11.5–15.5)
WBC: 6.4 10*3/uL (ref 4.0–10.5)

## 2014-07-27 LAB — T3, FREE: T3, Free: 2.8 pg/mL (ref 2.3–4.2)

## 2014-07-27 LAB — BASIC METABOLIC PANEL WITH GFR
BUN: 16 mg/dL (ref 6–23)
CHLORIDE: 100 meq/L (ref 96–112)
CO2: 29 mEq/L (ref 19–32)
Calcium: 9.2 mg/dL (ref 8.4–10.5)
Creat: 1.11 mg/dL — ABNORMAL HIGH (ref 0.50–1.10)
GFR, EST AFRICAN AMERICAN: 59 mL/min — AB
GFR, EST NON AFRICAN AMERICAN: 51 mL/min — AB
GLUCOSE: 92 mg/dL (ref 70–99)
POTASSIUM: 3.9 meq/L (ref 3.5–5.3)
SODIUM: 136 meq/L (ref 135–145)

## 2014-07-27 LAB — HEPATIC FUNCTION PANEL
ALBUMIN: 4 g/dL (ref 3.5–5.2)
ALK PHOS: 76 U/L (ref 39–117)
ALT: 21 U/L (ref 0–35)
AST: 30 U/L (ref 0–37)
Bilirubin, Direct: 0 mg/dL (ref 0.0–0.3)
Total Bilirubin: 0.5 mg/dL (ref 0.2–1.2)
Total Protein: 7.5 g/dL (ref 6.0–8.3)

## 2014-07-27 LAB — TSH: TSH: 0.68 u[IU]/mL (ref 0.35–4.50)

## 2014-07-27 LAB — T4, FREE: FREE T4: 0.83 ng/dL (ref 0.60–1.60)

## 2014-07-27 LAB — VITAMIN B12: Vitamin B-12: 390 pg/mL (ref 211–911)

## 2014-07-27 LAB — H. PYLORI ANTIBODY, IGG: H Pylori IgG: NEGATIVE

## 2014-07-27 MED ORDER — OMEPRAZOLE 40 MG PO CPDR: DELAYED_RELEASE_CAPSULE | ORAL | Status: DC

## 2014-07-27 MED ORDER — GI COCKTAIL ~~LOC~~
30.0000 mL | Freq: Once | ORAL | Status: AC
  Administered 2014-07-27: 30 mL via ORAL

## 2014-07-27 NOTE — Progress Notes (Signed)
Pre visit review using our clinic review tool, if applicable. No additional management support is needed unless otherwise documented below in the visit note. 

## 2014-07-27 NOTE — Addendum Note (Signed)
Addended by: Ewing Schlein on: 07/27/2014 01:31 PM   Modules accepted: Orders

## 2014-07-27 NOTE — Progress Notes (Signed)
   Subjective:    Patient ID: [name], female    DOB: 1946-03-23, 68 y.o.   MRN: [id number]  HPI Pt here f/u last ov with renal insufficiency.  See last ov.  Pt is concerned. Nephrology ov is pending.  Pt also worried about anemia and potassium.  She c/o sore throat and white mucous.  Pt is seeing neuropsych for memory loss-- tests were done.  Results are pending.       Review of Systems As above    Objective:   Physical Exam BP 116/74  Pulse 92  Temp(Src) 97.9 F (36.6 C) (Oral)  Wt 122 lb (55.339 kg)  SpO2 95% General appearance: alert, cooperative, appears stated age and no distress Throat: lips, mucosa, and tongue normal; teeth and gums normal Neck: no adenopathy, supple, symmetrical, trachea midline and thyroid not enlarged, symmetric, no tenderness/mass/nodules Lungs: clear to auscultation bilaterally Heart: S1, S2 normal Abdomen: abnormal findings:  mild tenderness in the epigastrium Extremities: extremities normal, atraumatic, no cyanosis or edema        Assessment & Plan:  1. Renal insufficiency Recheck labs today,  Pt will have labs done by radiology sent to [place] - BASIC METABOLIC PANEL WITH GFR  2. Other and unspecified hyperlipidemia Check labs - CBC with Differential - Hepatic function panel - Lipid panel  3. Memory deficit Check labs,  Testing done by neuro psych-- results pending ? If we need to do trial off statin - TSH - T3, free - T4, free - Vitamin B12  4. Bursitis of elbow, right Refer to ortho - Ambulatory referral to Orthopedic Surgery  5. Gastroesophageal reflux disease with esophagitis Inc omeprazole to bid, check labs and consider GI referral - omeprazole (PRILOSEC) 40 MG capsule; 1 po bid  Dispense: 180 capsule; Refill: 3 - H. pylori antibody, IgG

## 2014-07-27 NOTE — Patient Instructions (Signed)

## 2014-07-28 ENCOUNTER — Encounter: Payer: Self-pay | Admitting: Family Medicine

## 2014-08-20 DIAGNOSIS — R419 Unspecified symptoms and signs involving cognitive functions and awareness: Secondary | ICD-10-CM | POA: Insufficient documentation

## 2014-08-24 ENCOUNTER — Other Ambulatory Visit: Payer: Self-pay | Admitting: Family Medicine

## 2014-08-24 NOTE — Telephone Encounter (Signed)
Last seen 07/27/14 and filled 03/02/14 #180 with 1 refill. Please advise     KP

## 2014-09-02 ENCOUNTER — Other Ambulatory Visit: Payer: Self-pay

## 2014-09-02 MED ORDER — ATENOLOL 50 MG PO TABS: 50.0000 mg | ORAL_TABLET | Freq: Every morning | ORAL | Status: DC

## 2014-09-25 ENCOUNTER — Ambulatory Visit (INDEPENDENT_AMBULATORY_CARE_PROVIDER_SITE_OTHER): Payer: Medicare Other | Admitting: Family Medicine

## 2014-09-25 ENCOUNTER — Encounter: Payer: Self-pay | Admitting: Family Medicine

## 2014-09-25 VITALS — BP 123/80 | HR 65 | Temp 98.6°F | Wt 122.0 lb

## 2014-09-25 DIAGNOSIS — E785 Hyperlipidemia, unspecified: Secondary | ICD-10-CM

## 2014-09-25 DIAGNOSIS — F19930 Other psychoactive substance use, unspecified with withdrawal, uncomplicated: Secondary | ICD-10-CM

## 2014-09-25 DIAGNOSIS — Z23 Encounter for immunization: Secondary | ICD-10-CM

## 2014-09-25 LAB — POCT URINALYSIS DIPSTICK
Bilirubin, UA: NEGATIVE
Glucose, UA: NEGATIVE
Ketones, UA: NEGATIVE
Leukocytes, UA: NEGATIVE
Nitrite, UA: NEGATIVE
PROTEIN UA: NEGATIVE
RBC UA: NEGATIVE
SPEC GRAV UA: 1.015
UROBILINOGEN UA: 0.2
pH, UA: 7

## 2014-09-25 LAB — CBC WITH DIFFERENTIAL/PLATELET
BASOS PCT: 0.8 % (ref 0.0–3.0)
Basophils Absolute: 0 10*3/uL (ref 0.0–0.1)
EOS PCT: 3.2 % (ref 0.0–5.0)
Eosinophils Absolute: 0.2 10*3/uL (ref 0.0–0.7)
HCT: 37.5 % (ref 36.0–46.0)
Hemoglobin: 12.3 g/dL (ref 12.0–15.0)
LYMPHS ABS: 1.6 10*3/uL (ref 0.7–4.0)
Lymphocytes Relative: 32.5 % (ref 12.0–46.0)
MCHC: 32.7 g/dL (ref 30.0–36.0)
MCV: 89.3 fl (ref 78.0–100.0)
MONOS PCT: 8.8 % (ref 3.0–12.0)
Monocytes Absolute: 0.4 10*3/uL (ref 0.1–1.0)
NEUTROS ABS: 2.7 10*3/uL (ref 1.4–7.7)
Neutrophils Relative %: 54.7 % (ref 43.0–77.0)
Platelets: 261 10*3/uL (ref 150.0–400.0)
RBC: 4.2 Mil/uL (ref 3.87–5.11)
RDW: 16.9 % — ABNORMAL HIGH (ref 11.5–15.5)
WBC: 4.9 10*3/uL (ref 4.0–10.5)

## 2014-09-25 LAB — LIPID PANEL
CHOL/HDL RATIO: 5
Cholesterol: 230 mg/dL — ABNORMAL HIGH (ref 0–200)
HDL: 43.4 mg/dL (ref >39.00)
LDL CALC: 149 mg/dL — AB (ref 0–99)
NonHDL: 186.6
Triglycerides: 187 mg/dL — ABNORMAL HIGH (ref 0.0–149.0)
VLDL: 37.4 mg/dL (ref 0.0–40.0)

## 2014-09-25 LAB — BASIC METABOLIC PANEL
BUN: 16 mg/dL (ref 6–23)
CHLORIDE: 99 meq/L (ref 96–112)
CO2: 28 meq/L (ref 19–32)
Calcium: 9.3 mg/dL (ref 8.4–10.5)
Creatinine, Ser: 1.2 mg/dL (ref 0.4–1.2)
GFR: 47.77 mL/min — ABNORMAL LOW (ref >60.00)
Glucose, Bld: 87 mg/dL (ref 70–99)
Potassium: 3.8 mEq/L (ref 3.5–5.1)
SODIUM: 136 meq/L (ref 135–145)

## 2014-09-25 LAB — HEPATIC FUNCTION PANEL
ALT: 16 U/L (ref 0–35)
AST: 29 U/L (ref 0–37)
Albumin: 4.4 g/dL (ref 3.5–5.2)
Alkaline Phosphatase: 90 U/L (ref 39–117)
BILIRUBIN DIRECT: 0 mg/dL (ref 0.0–0.3)
BILIRUBIN TOTAL: 0.6 mg/dL (ref 0.2–1.2)
Total Protein: 8.3 g/dL (ref 6.0–8.3)

## 2014-09-25 MED ORDER — ATORVASTATIN CALCIUM 20 MG PO TABS: 20.0000 mg | ORAL_TABLET | Freq: Every day | ORAL | Status: DC

## 2014-09-25 NOTE — Patient Instructions (Signed)
Serotonin Syndrome Serotonin is a brain chemical that regulates the nervous system. Some kinds of drugs increase the amount of serotonin in your body. Drugs that increase the serotonin in your body include:   Anti-depressant medications.  St. John's wort.  Recreational drugs.  Migraine medicines.  Some pain medicines. SYMPTOMS Combining these drugs increases the risk that you will become ill with a toxic condition called serotonin syndrome.  Symptoms of too much serotonin include:  Confusion.  Agitation.  Weakness.  Insomnia.  Fever.  Sweats. Other symptoms that may develop include:  Shakiness.  Muscle spasms.  Seizures. TREATMENT  Hospital treatment is often needed until the effects are controlled.  Avoiding the combination of medicines listed above is recommended.  Check with your doctor if you are concerned about your medicine or the side effects. Document Released: 01/18/2005 Document Revised: 03/04/2012 Document Reviewed: 12/11/2005 ExitCare Patient Information 2015 [employer]. This information is not intended to replace advice given to you by your health care provider. Make sure you discuss any questions you have with your health care provider.  

## 2014-09-25 NOTE — Progress Notes (Signed)
   Subjective:    Patient ID: [name], female    DOB: [birth year], 68 y.o.   MRN: [id number]  HPI Pt is here at request of her ins co.  Dr at [facility] put her on lexapro with cymbalta and she has had loose stools,  Not able to sleep because of irritability so she stopped the lexapro 4 days ago and she feels so much better.  Her ins co called her because they were concerned about her taking both.     Review of Systems As above    Objective:   Physical Exam BP 123/80  Pulse 65  Temp(Src) 98.6 F (37 C) (Oral)  Wt 122 lb (55.339 kg)  SpO2 99% Lungs: clear to auscultation bilaterally Heart: S1, S2 normal Extremities: extremities normal, atraumatic, no cyanosis or edema Neurologic: Grossly normal       Assessment & Plan:  1. Need for prophylactic vaccination and inoculation against influenza   - Flu Vaccine QUAD 36+ mos PF IM (Fluarix Quad PF)  2. Hyperlipidemia Check labs - atorvastatin (LIPITOR) 20 MG tablet; Take 1 tablet (20 mg total) by mouth daily.  Dispense: 90 tablet; Refill: 3 - Hepatic function panel - Lipid panel  3. Serotonin withdrawal syndrome, uncomplicated Symptoms have resolved-- check labs--lexapro d/c Monday - Basic metabolic panel - Hepatic function panel - Lipid panel - POCT urinalysis dipstick - CBC with Differential

## 2014-09-25 NOTE — Progress Notes (Signed)
Pre visit review using our clinic review tool, if applicable. No additional management support is needed unless otherwise documented below in the visit note. 

## 2014-09-28 ENCOUNTER — Encounter: Payer: Self-pay | Admitting: Family Medicine

## 2014-09-28 MED ORDER — ZOSTER VACCINE LIVE 19400 UNT/0.65ML ~~LOC~~ SOLR: 0.6500 mL | Freq: Once | SUBCUTANEOUS | Status: DC

## 2014-10-14 ENCOUNTER — Other Ambulatory Visit: Payer: Self-pay | Admitting: *Deleted

## 2014-10-14 DIAGNOSIS — C50519 Malignant neoplasm of lower-outer quadrant of unspecified female breast: Secondary | ICD-10-CM

## 2014-10-15 ENCOUNTER — Other Ambulatory Visit: Payer: Self-pay | Admitting: *Deleted

## 2014-10-15 ENCOUNTER — Telehealth: Payer: Self-pay | Admitting: *Deleted

## 2014-10-15 ENCOUNTER — Other Ambulatory Visit (HOSPITAL_BASED_OUTPATIENT_CLINIC_OR_DEPARTMENT_OTHER): Payer: Medicare Other

## 2014-10-15 DIAGNOSIS — C50519 Malignant neoplasm of lower-outer quadrant of unspecified female breast: Secondary | ICD-10-CM

## 2014-10-15 DIAGNOSIS — D0511 Intraductal carcinoma in situ of right breast: Secondary | ICD-10-CM

## 2014-10-15 LAB — COMPREHENSIVE METABOLIC PANEL (CC13)
ALT: 18 U/L (ref 0–55)
ANION GAP: 10 meq/L (ref 3–11)
AST: 20 U/L (ref 5–34)
Albumin: 3.7 g/dL (ref 3.5–5.0)
Alkaline Phosphatase: 101 U/L (ref 40–150)
BUN: 17 mg/dL (ref 7.0–26.0)
CALCIUM: 9.6 mg/dL (ref 8.4–10.4)
CHLORIDE: 102 meq/L (ref 98–109)
CO2: 29 mEq/L (ref 22–29)
CREATININE: 1.2 mg/dL — AB (ref 0.6–1.1)
GLUCOSE: 89 mg/dL (ref 70–140)
Potassium: 2.9 mEq/L — CL (ref 3.5–5.1)
Sodium: 141 mEq/L (ref 136–145)
TOTAL PROTEIN: 7.2 g/dL (ref 6.4–8.3)
Total Bilirubin: 0.31 mg/dL (ref 0.20–1.20)

## 2014-10-15 LAB — CBC WITH DIFFERENTIAL/PLATELET
BASO%: 1 % (ref 0.0–2.0)
Basophils Absolute: 0.1 10*3/uL (ref 0.0–0.1)
EOS%: 2.5 % (ref 0.0–7.0)
Eosinophils Absolute: 0.2 10*3/uL (ref 0.0–0.5)
HEMATOCRIT: 36.4 % (ref 34.8–46.6)
HGB: 11.8 g/dL (ref 11.6–15.9)
LYMPH%: 25.5 % (ref 14.0–49.7)
MCH: 28.9 pg (ref 25.1–34.0)
MCHC: 32.4 g/dL (ref 31.5–36.0)
MCV: 89.2 fL (ref 79.5–101.0)
MONO#: 0.5 10*3/uL (ref 0.1–0.9)
MONO%: 8.7 % (ref 0.0–14.0)
NEUT#: 3.8 10*3/uL (ref 1.5–6.5)
NEUT%: 62.3 % (ref 38.4–76.8)
PLATELETS: 237 10*3/uL (ref 145–400)
RBC: 4.08 10*6/uL (ref 3.70–5.45)
RDW: 15.5 % — ABNORMAL HIGH (ref 11.2–14.5)
WBC: 6.1 10*3/uL (ref 3.9–10.3)
lymph#: 1.6 10*3/uL (ref 0.9–3.3)

## 2014-10-15 MED ORDER — POTASSIUM CHLORIDE ER 10 MEQ PO CPCR: 20.0000 meq | ORAL_CAPSULE | Freq: Two times a day (BID) | ORAL | Status: DC

## 2014-10-15 NOTE — Telephone Encounter (Signed)
Per lab draw today for MD appointment next week noted potassium level of 2.9.  Per med list pt is taking 40 mg of lasix daily per primary MD- Dr Etter Sjogren.  No prescription for potassium on med list.  Per call to pt she states she has a bottle of klor con 20 mEq in home that she was taking at one time- but stopped due to difficulty swallowing " because I have reflux and an hemangioma on my esophagus "  Per discussion with MD prescription for micro K obtained - reviewed with pt and sent to her pharmacy.  This note with lab results will be sent to her primary MD for further follow up.

## 2014-10-17 ENCOUNTER — Other Ambulatory Visit: Payer: Self-pay | Admitting: Oncology

## 2014-10-21 ENCOUNTER — Other Ambulatory Visit: Payer: Self-pay | Admitting: *Deleted

## 2014-10-21 DIAGNOSIS — C50519 Malignant neoplasm of lower-outer quadrant of unspecified female breast: Secondary | ICD-10-CM

## 2014-10-21 DIAGNOSIS — E876 Hypokalemia: Secondary | ICD-10-CM

## 2014-10-22 ENCOUNTER — Other Ambulatory Visit (HOSPITAL_BASED_OUTPATIENT_CLINIC_OR_DEPARTMENT_OTHER): Payer: Medicare Other

## 2014-10-22 ENCOUNTER — Telehealth: Payer: Self-pay | Admitting: Oncology

## 2014-10-22 ENCOUNTER — Ambulatory Visit (HOSPITAL_BASED_OUTPATIENT_CLINIC_OR_DEPARTMENT_OTHER): Payer: Medicare Other | Admitting: Oncology

## 2014-10-22 VITALS — BP 117/71 | HR 80 | Temp 98.2°F | Resp 18 | Ht 64.0 in | Wt 121.7 lb

## 2014-10-22 DIAGNOSIS — D0511 Intraductal carcinoma in situ of right breast: Secondary | ICD-10-CM

## 2014-10-22 DIAGNOSIS — M797 Fibromyalgia: Secondary | ICD-10-CM

## 2014-10-22 DIAGNOSIS — Z170 Estrogen receptor positive status [ER+]: Secondary | ICD-10-CM

## 2014-10-22 DIAGNOSIS — C50519 Malignant neoplasm of lower-outer quadrant of unspecified female breast: Secondary | ICD-10-CM

## 2014-10-22 DIAGNOSIS — C50911 Malignant neoplasm of unspecified site of right female breast: Secondary | ICD-10-CM

## 2014-10-22 DIAGNOSIS — E876 Hypokalemia: Secondary | ICD-10-CM

## 2014-10-22 LAB — COMPREHENSIVE METABOLIC PANEL (CC13)
ALT: 16 U/L (ref 0–55)
AST: 25 U/L (ref 5–34)
Albumin: 3.8 g/dL (ref 3.5–5.0)
Alkaline Phosphatase: 95 U/L (ref 40–150)
Anion Gap: 9 mEq/L (ref 3–11)
BUN: 13.5 mg/dL (ref 7.0–26.0)
CHLORIDE: 104 meq/L (ref 98–109)
CO2: 26 meq/L (ref 22–29)
CREATININE: 1.1 mg/dL (ref 0.6–1.1)
Calcium: 9.4 mg/dL (ref 8.4–10.4)
Glucose: 85 mg/dl (ref 70–140)
Potassium: 3.9 mEq/L (ref 3.5–5.1)
SODIUM: 139 meq/L (ref 136–145)
TOTAL PROTEIN: 7.4 g/dL (ref 6.4–8.3)
Total Bilirubin: 0.3 mg/dL (ref 0.20–1.20)

## 2014-10-22 NOTE — Telephone Encounter (Signed)
, °

## 2014-10-22 NOTE — Progress Notes (Signed)
ID: [name]   DOB: 09-10-1946  MR#: [id number]  DEY#:[id number]  PCP: Sheri Koyanagi, DO GYN:  SU: [name],  OTHER MD: [name], [name], [name], [name], [name],   HISTORY OF PRESENT ILLNESS: From the original intake note:  [name] noted a minimal discharge from the right breast early and 2013. She did not think much of this until it became more copious him a and then she brought it to [name] attention and bilateral breast MRI 07/29/2012 showed 2 areas of concern in the right breast. Right breast biopsy x3 was obtained 08/23/2012, and showed ([id number]) atypical ductal hyperplasia partially involving a papillary lesion. Accordingly, on 10/11/2012 the patient underwent right lumpectomy for what proved to be ([id number] ductal carcinoma in situ, low-grade, measuring approximately 3.2 cm, with negative margins (0.15 cm), 100% estrogen receptor positive, 61% progesterone receptor positive.  With his results the patient was referred to radiation oncology and she is currently receiving of radiation treatments under [name], scheduled to be completed mid December 2013.  The patient's subsequent history is as detailed below.  INTERVAL HISTORY: [name] returns today for followup of her noninvasive breast cancer. the interval history is generally unremarkable. She does a little gardening but as they have moved to a smaller place there is little room for that. She doesn't little bit of walking. 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  REVIEW OF SYSTEMS: She has significant pain in her ankles knees hips elbows and hands. This wakes her up at night. She sleeps poorly in any case because of nocturia. She has been diagnosed with interstitial cystitis. She has seasonal sinus problems and currently a little bit of of a sore throat which makes it hard for her to swallow. She complains of ankle swelling, irregular heartbeats, shortness  of breath when climbing stairs, poor appetite, forgetfulness and anxiety. She has been feeling tired most of the time but occasionally has a "good day". A detailed review of systems today was otherwise noncontributory  PAST MEDICAL HISTORY: Past Medical History  Diagnosis Date  . GERD (gastroesophageal reflux disease)   . Arthritis   . Asthma   . Hypertension   . History of colonic polyps     BENIGN  . Fibromyalgia   . S/P radiation therapy 11/12/12 - 12/05/12    right Breast  . CAD (coronary artery disease) CARDIOLOGIST--  [name]    mild non-obstructive cad  . COPD (chronic obstructive pulmonary disease)   . History of breast cancer ONCOLOGIST-- [name]---  NO RECURRANCE    DX 07/2012;  LOW GRADE DCIS  ER+PR+  ----  S/P RIGHT LUMPECTOMY WITH NEGATIVE MARGINS/   RADIATION ENDED 11/2012  . History of chronic bronchitis   . Pelvic pain   . Frequency of urination   . Urgency of urination   . Chronic constipation   . History of tachycardia     CONTROLLED  WITH ATENOLOL  . History of basal cell carcinoma excision     X2  . Sinus headache   . H/O hiatal hernia     PAST SURGICAL HISTORY: Past Surgical History  Procedure Laterality Date  . Nasal sinus surgery  1985  . Cardiovascular stress test  06-18-2012  [name]    LOW RISK NUCLEAR STUDY/  SMALL FIXED AREA OF MODERATELY DECREASED UPTAKE IN ANTEROSEPTAL WALL WHICH MAY BE ARTIFACTUAL/  NO ISCHEMIA/  EF 68%  . Transthoracic echocardiogram  06-24-2012    GRADE I DIASTOLIC DYSFUNCTION/  EF 55-60%/  MILD MR  . Right breast bx  08-23-2012  . Removal vocal cord cyst  FEB 2014  . Cardiac catheterization  09-13-2007  [name]    WELL-PRESERVED LVF/  DIFFUSE SCATTERED CORONARY CALCIFACATION AND ATHEROSCLEROSIS WITHOUT OBSTRUCTION  . Cardiac catheterization  08-04-2010  Sheri [name]    NON-OBSTRUCTIVE CAD/  pLAD 40%/  oLAD 30%/  mLAD 30%/  pRCA 30%/  EF 60%  . Colonoscopy  09-29-2010  . Breast lumpectomy Right 10-11-2012     W/ SLN BX  . Total abdominal hysterectomy w/ bilateral salpingoophorectomy  1982    W/  APPENDECTOMY  . Tonsillectomy and adenoidectomy  AGE 80  . Orif right ankle  fx  2006  . Right hand surgery  X3  LAST ONE 2009    INCLUDES  ORIF RIGHT 5TH FINGER AND REVISION TWICE  . Cystoscopy with hydrodistension and biopsy N/A 03/06/2014    Procedure: CYSTOSCOPY/HYDRODISTENSION/ INSTILATION OF MARCAINE AND PYRIDIUM;  Surgeon: Sheri Rud, MD;  Location: [facility];  Service: Urology;  Laterality: N/A;    FAMILY HISTORY Family History  Problem Relation Age of Onset  . Rectal cancer Mother   . Cancer Mother     rectal,  breast, pancreatic  . Irritable bowel syndrome Son   . Irritable bowel syndrome Daughter   . Heart disease Other   . Arthritis Other   . Cancer Other     breast cancer 1st degree relative <50, lung  . Diabetes Other     1st degree relative   . Hyperlipidemia Other   . Hypertension Other   . Osteoporosis Other   . Coronary artery disease Other 65  . Cancer Maternal Aunt     breast    GYNECOLOGIC HISTORY: Menarche age 71, first live birth age 28, TAH/BSO in 14. She was on hormone replacement between 1982 and 2013, at the time of her breast cancer diagnosis.  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 Her husband [name] used to be Insurance underwriter for [employer], now is retired and has helped manage a friend's car lot, something he sees as an location of his ministry. Their children are [name], who is an Chief Financial Officer with [employer] in [place]; [name], who works as an Radio producer for [employer] and lives in [place]; and [name], who works as a Radio producer (fourth-grade) in [place].   ADVANCED DIRECTIVES: In place  HEALTH MAINTENANCE: History  Substance Use Topics  . Smoking status: Former Smoker -- 1.00 packs/day for 15 years     Types: Cigarettes    Quit date: 05/27/2012  . Smokeless tobacco: Never Used  . Alcohol Use: Yes     Comment: occasional     Colonoscopy: 09/29/2010 [name])  PAP: JAN 2013 ([place])  Bone density: 04/17/2011/ osteoporosis  Lipid panel:  Allergies  Allergen Reactions  . Clindamycin Hcl Shortness Of Breath and Rash  . Penicillins Anaphylaxis  . Prednisone Shortness Of Breath and Rash  . Codeine Hives and Other (See Comments)    headache  . Fluzone [Flu Virus Vaccine] Other (See Comments)    Local reaction at the site  . Latex Hives  . Pentazocine Lactate Other (See Comments)    HALLUCINATION  . Pneumococcal Vaccine Polyvalent Hives, Swelling and Other (See Comments)    REACTION: redness, swelling,  and hives at injection site  . Tamoxifen Nausea And Vomiting and Other (See Comments)    HEADACHE    Current Outpatient Prescriptions  Medication Sig Dispense Refill  . acetaminophen (TYLENOL) 500 MG tablet Take 500 mg by mouth every 6 (six) hours as needed.      Marland Kitchen albuterol (PROVENTIL HFA;VENTOLIN HFA) 108 (90 BASE) MCG/ACT inhaler Inhale 2 puffs into the lungs every 4 (four) hours as needed.  1 Inhaler  5  . aspirin EC 81 MG tablet Take 1 tablet (81 mg total) by mouth daily.  90 tablet  3  . atenolol (TENORMIN) 50 MG tablet Take 1 tablet (50 mg total) by mouth every morning.  90 tablet  1  . atorvastatin (LIPITOR) 20 MG tablet Take 1 tablet (20 mg total) by mouth daily.  90 tablet  3  . DULoxetine (CYMBALTA) 60 MG capsule Take 1 capsule (60 mg total) by mouth 2 (two) times daily.  180 capsule  3  . ELMIRON 100 MG capsule Take 1 capsule by mouth 3 (three) times daily.      Marland Kitchen estradiol (ESTRACE) 0.1 MG/GM vaginal cream Place 1 Applicatorful vaginally 2 (two) times a week. ALTERNATES WITH ESTRADIAL TABLET      . Estradiol 10 MCG TABS vaginal tablet Place 1 tablet (10 mcg total) vaginally 2 (two) times a week.  8 tablet  11  . fluticasone (FLONASE) 50 MCG/ACT nasal spray Place  1 spray into the nose as needed.       . furosemide (LASIX) 20 MG tablet Take 2 tablets (40 mg total) by mouth every morning.  180 tablet  1  . gabapentin (NEURONTIN) 300 MG capsule 300 mg 3 (three) times daily. 1-2 tabs TID      . imipramine (TOFRANIL) 25 MG tablet Take 1 tablet (25 mg total) by mouth at bedtime.  90 tablet  0  . Multiple Vitamin (MULTIVITAMIN) tablet Take 1 tablet by mouth daily.       Marland Kitchen omeprazole (PRILOSEC) 40 MG capsule 1 po bid  180 capsule  3  . potassium chloride (MICRO-K) 10 MEQ CR capsule Take 2 capsules (20 mEq total) by mouth 2 (two) times daily.  120 capsule  1  . Probiotic Product (ACIDOPHILUS PROBIOTIC COMPLEX PO) Take 1 capsule by mouth daily.      . rosuvastatin (CRESTOR) 10 MG tablet Take 1 tablet (10 mg total) by mouth every evening.  90 tablet  3  . traMADol (ULTRAM) 50 MG tablet Take 50-100 mg by mouth 3 (three) times daily as needed.      . traZODone (DESYREL) 100 MG tablet TAKE 2 TABLETS AT BEDTIME AS NEEDED FOR SLEEP  180 tablet  1  . URELLE (URELLE/URISED) 81 MG TABS tablet Take 1 tablet (81 mg total) by mouth 3 (three) times daily.  30 each  2  . zoster vaccine live, PF, (ZOSTAVAX) 16109 UNT/0.65ML injection Inject 19,400 Units into the skin once.  1 each  0   No current facility-administered medications for this visit.   Facility-Administered Medications Ordered in Other Visits  Medication Dose Route Frequency Provider Last Rate Last Dose  . bupivacaine (MARCAINE) 0.5 % 10 mL, triamcinolone acetonide (KENALOG-40) 40 mg injection   Subcutaneous Once Sheri Rud, MD      . bupivacaine (MARCAINE) 0.5 % 15 mL, phenazopyridine (PYRIDIUM) 400 mg bladder mixture   Bladder Instillation Once Sheri Rud, MD        OBJECTIVE: middle-aged white woman in no  acute distress Filed Vitals:   10/22/14 1137  BP: 117/71  Pulse: 80  Temp: 98.2 F (36.8 C)  Resp: 18     Body mass index is 20.88 kg/(m^2).    ECOG FS: 1  Sclerae unicteric, pupils  round and equal Oropharynx clear, teeth in good repair No cervical or supraclavicular adenopathy Lungs no rales or rhonchi Heart regular rate and rhythm Abd soft, nontender, positive bowel sounds MSK no significant spinal tenderness to moderate palpation,, no upper extremity lymphedema Neuro: nonfocal, well oriented, appropriate affect Breasts: The right breast is status post lumpectomy. There is no evidence of local recurrence. The right axilla is benign. The left breast is unremarkable.   LAB RESULTS: Lab Results  Component Value Date   WBC 6.1 10/15/2014   NEUTROABS 3.8 10/15/2014   HGB 11.8 10/15/2014   HCT 36.4 10/15/2014   MCV 89.2 10/15/2014   PLT 237 10/15/2014      Chemistry      Component Value Date/Time   NA 139 10/22/2014 1112   NA 136 09/25/2014 1219   K 3.9 10/22/2014 1112   K 3.8 09/25/2014 1219   CL 99 09/25/2014 1219   CL 103 11/12/2012 1549   CO2 26 10/22/2014 1112   CO2 28 09/25/2014 1219   BUN 13.5 10/22/2014 1112   BUN 16 09/25/2014 1219   CREATININE 1.1 10/22/2014 1112   CREATININE 1.2 09/25/2014 1219   CREATININE 1.11* 07/27/2014 1227      Component Value Date/Time   CALCIUM 9.4 10/22/2014 1112   CALCIUM 9.3 09/25/2014 1219   ALKPHOS 95 10/22/2014 1112   ALKPHOS 90 09/25/2014 1219   AST 25 10/22/2014 1112   AST 29 09/25/2014 1219   ALT 16 10/22/2014 1112   ALT 16 09/25/2014 1219   BILITOT 0.30 10/22/2014 1112   BILITOT 0.6 09/25/2014 1219       No results found for this basename: LABCA2    No components found with this basename: LABCA125    No results found for this basename: INR,  in the last 168 hours  Urinalysis    Component Value Date/Time   COLORURINE YELLOW 01/06/2014 [place] 01/06/2014 1219   LABSPEC 1.015 01/06/2014 1219   PHURINE 7.0 01/06/2014 1219   GLUCOSEU NEG 01/06/2014 [place] 07/19/2011 1457   HGBUR NEG 01/06/2014 1219   BILIRUBINUR neg 09/25/2014 1232   BILIRUBINUR NEG 01/06/2014 1219    KETONESUR NEG 01/06/2014 1219   PROTEINUR neg 09/25/2014 1232   PROTEINUR NEG 01/06/2014 1219   UROBILINOGEN 0.2 09/25/2014 1232   UROBILINOGEN 0.2 01/06/2014 1219   NITRITE neg 09/25/2014 1232   NITRITE NEG 01/06/2014 1219   LEUKOCYTESUR Negative 09/25/2014 1232    STUDIES: Study Result    CLINICAL DATA: Status post right lumpectomy and radiation therapy  for breast cancer in 2013. Two paternal aunts diagnosed with breast  cancer in their 46s.  LABS: BUN and creatinine were obtained on site at [facility] at  [address]  Results: BUN 21 mg/dL, Creatinine 1.4 mg/dL.  EXAM:  BILATERAL BREAST MRI WITH AND WITHOUT CONTRAST  TECHNIQUE:  Multiplanar, multisequence MR images of both breasts were obtained  prior to and following the intravenous administration of 17ml of  MultiHance. The contrast was given before the technologist received  her lab results from today, with a calculated GFR of 37. Otherwise,  a half dose would of been given. Doctor Maryland Pink discussed this with  Sheri.  Odysseus Cada and with the patient. The patient was instructed to  hydrate well over the weekend and not participate in outdoor  activities that could cause dehydration.  THREE-DIMENSIONAL MR IMAGE RENDERING ON INDEPENDENT WORKSTATION:  Three-dimensional MR images were rendered by post-processing of the  original MR data on an independent workstation. The  three-dimensional MR images were interpreted, and findings are  reported in the following complete MRI report for this study. Three  dimensional images were evaluated at the independent DynaCad  workstation  COMPARISON: Previous examinations. The most recent breast MRI for  comparison is 08/08/2013 with an MR guided core needle biopsy dated  09/04/2013, with benign results. The most recent mammogram is dated  06/23/2014.  FINDINGS:  Breast composition: d. Extreme fibroglandular tissue.  Background parenchymal enhancement: Minimal  Right breast:  The previously demonstrated 7 mm oval, enhancing mass  deep in the outer right breast has not changed significantly since  07/29/2012. The 2 year stability is compatible with a benign  process. No new masses or areas of enhancement suspicious for  malignancy in the right breast.  Left breast: No mass or abnormal enhancement.  Lymph nodes: No abnormal appearing lymph nodes.  Ancillary findings: The previously demonstrated left lobe liver  hemangiomas, confirmed by MR on 09/05/2012, are unchanged. There is  an interval 4 mm probable cyst or hemangioma in the medial segment  of the left lobe of the liver. Multiple small cysts were previously  demonstrated elsewhere in the liver on 07/29/2012. Scarring in the  right middle lobe is unchanged.  IMPRESSION:  No evidence of malignancy.  RECOMMENDATION:  Bilateral diagnostic mammogram in 1 year.  BI-RADS CATEGORY 2: Benign.  Electronically Signed  By: Enrique Sack M.D.  On: 07/06/2014 13:37      ASSESSMENT: 69 y.o. [name] woman s/p Right lumpectomy 10/11/2012 for low-grade ductal carcinoma in situ, estrogen and progesterone receptor positive, with negative though close margins  (1) radiation therapy completed 12/05/2012  (2) tamoxifen started at the completion of radiation, tolerated poorly, discontinued 03/10/2013  (3) breast density category D  PLAN:  [name] is now 2 years out from her definitive surgery. We are going to start following her on an every 6 month basis for the next year.  Because of her extreme breast density she will need yearly breast MRIs in addition to routine mammography. Unfortunately this is very expensive. We discussed possibly going to every 2 years but at this point she would prefer to continue yearly if on all possible.  Her potassium remains low even though she is taking 20 mEq twice daily. I have asked her to go to 20 mEq 3 times a day but also to call her cardiologist and see if she can go off the Lasix  or perhaps drop the dose, since that is likely the reason for the low potassium problem.  I have encouraged her to increase her exercise program. Possibly this might help her fibromyalgia. Yoga might be a particularly good choice for her.  [place] notes to call for any problems that may develop before her next visit here.    [name] C    10/22/2014

## 2014-10-26 ENCOUNTER — Telehealth: Payer: Self-pay | Admitting: *Deleted

## 2014-10-26 ENCOUNTER — Encounter: Payer: Self-pay | Admitting: Family Medicine

## 2014-10-26 NOTE — Telephone Encounter (Signed)
Per review of lab obtain at visit and MD recommendation to increase to 20 mEq tid - pt may decrease and maintain at bid dosing.  Pt contacted- reviewed above with verbalized understanding.

## 2014-11-09 ENCOUNTER — Other Ambulatory Visit: Payer: Self-pay

## 2014-11-09 MED ORDER — FUROSEMIDE 20 MG PO TABS: 40.0000 mg | ORAL_TABLET | Freq: Every morning | ORAL | Status: DC

## 2014-11-25 ENCOUNTER — Telehealth: Payer: Self-pay

## 2014-11-25 NOTE — Telephone Encounter (Signed)
After 5 years we can consider stopping

## 2014-11-25 NOTE — Telephone Encounter (Signed)
I called the patient to see if [name] wanted to get her Re-clast this year and [name] stated [name] was waiting for Dr.Lowne to tell her if [name] still needs it or not. [name] wanted to know how many years should you get the Re-clast done. Please advise     KP

## 2014-11-26 NOTE — Telephone Encounter (Signed)
Detailed message advising of Dr.Lowne's recommendations and to call the office if she wants to proceed with Re-clast.      KP

## 2014-12-03 ENCOUNTER — Other Ambulatory Visit: Payer: Self-pay | Admitting: Family Medicine

## 2014-12-05 NOTE — Telephone Encounter (Signed)
No entry 

## 2015-01-01 ENCOUNTER — Inpatient Hospital Stay (HOSPITAL_BASED_OUTPATIENT_CLINIC_OR_DEPARTMENT_OTHER)
Admission: EM | Admit: 2015-01-01 | Discharge: 2015-01-04 | DRG: 195 | Disposition: A | Discharge status: Home / Self Care | Payer: Medicare Other | Attending: Internal Medicine | Admitting: Internal Medicine

## 2015-01-01 ENCOUNTER — Emergency Department (HOSPITAL_BASED_OUTPATIENT_CLINIC_OR_DEPARTMENT_OTHER): Payer: Medicare Other

## 2015-01-01 ENCOUNTER — Encounter: Payer: Self-pay | Admitting: Medical

## 2015-01-01 ENCOUNTER — Encounter (HOSPITAL_BASED_OUTPATIENT_CLINIC_OR_DEPARTMENT_OTHER): Payer: Self-pay

## 2015-01-01 ENCOUNTER — Ambulatory Visit (INDEPENDENT_AMBULATORY_CARE_PROVIDER_SITE_OTHER): Payer: Medicare Other | Admitting: Medical

## 2015-01-01 VITALS — BP 129/81 | HR 79 | Temp 97.5°F | Ht 64.0 in | Wt 115.8 lb

## 2015-01-01 DIAGNOSIS — Z90710 Acquired absence of both cervix and uterus: Secondary | ICD-10-CM | POA: Diagnosis not present

## 2015-01-01 DIAGNOSIS — K219 Gastro-esophageal reflux disease without esophagitis: Secondary | ICD-10-CM | POA: Diagnosis present

## 2015-01-01 DIAGNOSIS — R079 Chest pain, unspecified: Secondary | ICD-10-CM | POA: Diagnosis not present

## 2015-01-01 DIAGNOSIS — Z87891 Personal history of nicotine dependence: Secondary | ICD-10-CM | POA: Diagnosis not present

## 2015-01-01 DIAGNOSIS — Z85828 Personal history of other malignant neoplasm of skin: Secondary | ICD-10-CM

## 2015-01-01 DIAGNOSIS — I2510 Atherosclerotic heart disease of native coronary artery without angina pectoris: Secondary | ICD-10-CM | POA: Diagnosis present

## 2015-01-01 DIAGNOSIS — Z86010 Personal history of colonic polyps: Secondary | ICD-10-CM | POA: Diagnosis not present

## 2015-01-01 DIAGNOSIS — F1010 Alcohol abuse, uncomplicated: Secondary | ICD-10-CM | POA: Diagnosis present

## 2015-01-01 DIAGNOSIS — Z880 Allergy status to penicillin: Secondary | ICD-10-CM

## 2015-01-01 DIAGNOSIS — J449 Chronic obstructive pulmonary disease, unspecified: Secondary | ICD-10-CM | POA: Diagnosis present

## 2015-01-01 DIAGNOSIS — J45909 Unspecified asthma, uncomplicated: Secondary | ICD-10-CM | POA: Diagnosis present

## 2015-01-01 DIAGNOSIS — R0602 Shortness of breath: Secondary | ICD-10-CM

## 2015-01-01 DIAGNOSIS — Z885 Allergy status to narcotic agent status: Secondary | ICD-10-CM

## 2015-01-01 DIAGNOSIS — J189 Pneumonia, unspecified organism: Principal | ICD-10-CM | POA: Diagnosis present

## 2015-01-01 DIAGNOSIS — Z881 Allergy status to other antibiotic agents status: Secondary | ICD-10-CM | POA: Diagnosis not present

## 2015-01-01 DIAGNOSIS — M1990 Unspecified osteoarthritis, unspecified site: Secondary | ICD-10-CM | POA: Diagnosis present

## 2015-01-01 DIAGNOSIS — I10 Essential (primary) hypertension: Secondary | ICD-10-CM | POA: Diagnosis present

## 2015-01-01 DIAGNOSIS — R0789 Other chest pain: Secondary | ICD-10-CM | POA: Insufficient documentation

## 2015-01-01 DIAGNOSIS — M797 Fibromyalgia: Secondary | ICD-10-CM | POA: Diagnosis present

## 2015-01-01 DIAGNOSIS — K5900 Constipation, unspecified: Secondary | ICD-10-CM | POA: Diagnosis present

## 2015-01-01 DIAGNOSIS — Z853 Personal history of malignant neoplasm of breast: Secondary | ICD-10-CM | POA: Diagnosis not present

## 2015-01-01 DIAGNOSIS — Z888 Allergy status to other drugs, medicaments and biological substances status: Secondary | ICD-10-CM

## 2015-01-01 DIAGNOSIS — R0600 Dyspnea, unspecified: Secondary | ICD-10-CM | POA: Insufficient documentation

## 2015-01-01 DIAGNOSIS — Z7982 Long term (current) use of aspirin: Secondary | ICD-10-CM

## 2015-01-01 HISTORY — DX: Other chest pain: R07.89

## 2015-01-01 LAB — CBC WITH DIFFERENTIAL/PLATELET
BAND NEUTROPHILS: 1 % (ref 0–10)
BASOS ABS: 0 10*3/uL (ref 0.0–0.1)
BASOS PCT: 0 % (ref 0–1)
Eosinophils Absolute: 0 10*3/uL (ref 0.0–0.7)
Eosinophils Relative: 0 % (ref 0–5)
HEMATOCRIT: 38.3 % (ref 36.0–46.0)
Hemoglobin: 12.6 g/dL (ref 12.0–15.0)
LYMPHS ABS: 2.3 10*3/uL (ref 0.7–4.0)
Lymphocytes Relative: 13 % (ref 12–46)
MCH: 29.1 pg (ref 26.0–34.0)
MCHC: 32.9 g/dL (ref 30.0–36.0)
MCV: 88.5 fL (ref 78.0–100.0)
Metamyelocytes Relative: 2 %
Monocytes Absolute: 1.1 10*3/uL — ABNORMAL HIGH (ref 0.1–1.0)
Monocytes Relative: 6 % (ref 3–12)
Myelocytes: 3 %
NEUTROS PCT: 75 % (ref 43–77)
Neutro Abs: 14.2 10*3/uL — ABNORMAL HIGH (ref 1.7–7.7)
PLATELETS: 508 10*3/uL — AB (ref 150–400)
RBC: 4.33 MIL/uL (ref 3.87–5.11)
RDW: 15.6 % — ABNORMAL HIGH (ref 11.5–15.5)
WBC: 17.6 10*3/uL — AB (ref 4.0–10.5)

## 2015-01-01 LAB — BASIC METABOLIC PANEL
ANION GAP: 9 (ref 5–15)
BUN: 19 mg/dL (ref 6–23)
CALCIUM: 9.1 mg/dL (ref 8.4–10.5)
CO2: 26 mmol/L (ref 19–32)
CREATININE: 1.06 mg/dL (ref 0.50–1.10)
Chloride: 100 mEq/L (ref 96–112)
GFR, EST AFRICAN AMERICAN: 61 mL/min — AB (ref >90)
GFR, EST NON AFRICAN AMERICAN: 52 mL/min — AB (ref >90)
GLUCOSE: 87 mg/dL (ref 70–99)
Potassium: 2.9 mmol/L — ABNORMAL LOW (ref 3.5–5.1)
SODIUM: 135 mmol/L (ref 135–145)

## 2015-01-01 LAB — TROPONIN I: Troponin I: <0.03 ng/mL (ref <0.031)

## 2015-01-01 MED ORDER — FLUTICASONE PROPIONATE 50 MCG/ACT NA SUSP
1.0000 | Freq: Every day | NASAL | Status: DC
  Administered 2015-01-02 – 2015-01-04 (×3): 1 via NASAL
  Filled 2015-01-01: qty 16

## 2015-01-01 MED ORDER — ROSUVASTATIN CALCIUM 10 MG PO TABS: 10.0000 mg | ORAL_TABLET | Freq: Every evening | ORAL | Status: DC

## 2015-01-01 MED ORDER — ZOSTER VACCINE LIVE 19400 UNT/0.65ML ~~LOC~~ SOLR: 0.6500 mL | Freq: Once | SUBCUTANEOUS | Status: DC

## 2015-01-01 MED ORDER — ASPIRIN EC 81 MG PO TBEC
81.0000 mg | DELAYED_RELEASE_TABLET | Freq: Every day | ORAL | Status: DC
  Administered 2015-01-01 – 2015-01-04 (×4): 81 mg via ORAL
  Filled 2015-01-01 (×4): qty 1

## 2015-01-01 MED ORDER — SODIUM CHLORIDE 0.9 % IJ SOLN: 3.0000 mL | Freq: Two times a day (BID) | INTRAMUSCULAR | Status: DC

## 2015-01-01 MED ORDER — ACETAMINOPHEN 500 MG PO TABS
1000.0000 mg | ORAL_TABLET | Freq: Four times a day (QID) | ORAL | Status: DC | PRN
  Administered 2015-01-02 – 2015-01-04 (×3): 1000 mg via ORAL
  Filled 2015-01-01 (×3): qty 2

## 2015-01-01 MED ORDER — ATENOLOL 25 MG PO TABS
50.0000 mg | ORAL_TABLET | Freq: Every morning | ORAL | Status: DC
  Administered 2015-01-02 – 2015-01-04 (×3): 50 mg via ORAL
  Filled 2015-01-01 (×3): qty 2

## 2015-01-01 MED ORDER — URELLE 81 MG PO TABS
1.0000 | ORAL_TABLET | Freq: Three times a day (TID) | ORAL | Status: DC
  Administered 2015-01-02 (×2): 81 mg via ORAL
  Filled 2015-01-01 (×9): qty 1

## 2015-01-01 MED ORDER — ONE-DAILY MULTI VITAMINS PO TABS: 1.0000 | ORAL_TABLET | Freq: Every day | ORAL | Status: DC

## 2015-01-01 MED ORDER — SODIUM CHLORIDE 0.9 % IJ SOLN: 3.0000 mL | INTRAMUSCULAR | Status: DC | PRN

## 2015-01-01 MED ORDER — ADULT MULTIVITAMIN W/MINERALS CH
1.0000 | ORAL_TABLET | Freq: Every day | ORAL | Status: DC
  Administered 2015-01-02 – 2015-01-04 (×3): 1 via ORAL
  Filled 2015-01-01 (×3): qty 1

## 2015-01-01 MED ORDER — ESTRADIOL 10 MCG VA TABS: 1.0000 | ORAL_TABLET | VAGINAL | Status: DC

## 2015-01-01 MED ORDER — POTASSIUM CHLORIDE CRYS ER 20 MEQ PO TBCR
40.0000 meq | EXTENDED_RELEASE_TABLET | Freq: Once | ORAL | Status: AC
  Administered 2015-01-01: 40 meq via ORAL
  Filled 2015-01-01: qty 2

## 2015-01-01 MED ORDER — LEVOFLOXACIN IN D5W 750 MG/150ML IV SOLN
750.0000 mg | INTRAVENOUS | Status: DC
  Filled 2015-01-01: qty 150

## 2015-01-01 MED ORDER — SODIUM CHLORIDE 0.9 % IV SOLN: 250.0000 mL | INTRAVENOUS | Status: DC | PRN

## 2015-01-01 MED ORDER — POTASSIUM CHLORIDE IN NACL 20-0.9 MEQ/L-% IV SOLN
INTRAVENOUS | Status: DC
Start: 2015-01-01 — End: 2015-01-02
  Administered 2015-01-01: 23:00:00 via INTRAVENOUS
  Filled 2015-01-01 (×2): qty 1000

## 2015-01-01 MED ORDER — ACETAMINOPHEN 325 MG PO TABS
650.0000 mg | ORAL_TABLET | Freq: Once | ORAL | Status: AC
  Administered 2015-01-01: 650 mg via ORAL
  Filled 2015-01-01: qty 2

## 2015-01-01 MED ORDER — ATORVASTATIN CALCIUM 20 MG PO TABS
20.0000 mg | ORAL_TABLET | Freq: Every day | ORAL | Status: DC
  Administered 2015-01-02 – 2015-01-04 (×3): 20 mg via ORAL
  Filled 2015-01-01 (×3): qty 1

## 2015-01-01 MED ORDER — PANTOPRAZOLE SODIUM 40 MG PO TBEC
40.0000 mg | DELAYED_RELEASE_TABLET | Freq: Every day | ORAL | Status: DC
  Administered 2015-01-01 – 2015-01-04 (×4): 40 mg via ORAL
  Filled 2015-01-01 (×5): qty 1

## 2015-01-01 MED ORDER — ALBUTEROL SULFATE (2.5 MG/3ML) 0.083% IN NEBU: 2.5000 mg | INHALATION_SOLUTION | RESPIRATORY_TRACT | Status: DC | PRN

## 2015-01-01 MED ORDER — PENTOSAN POLYSULFATE SODIUM 100 MG PO CAPS
100.0000 mg | ORAL_CAPSULE | Freq: Three times a day (TID) | ORAL | Status: DC
  Administered 2015-01-02 (×2): 100 mg via ORAL
  Filled 2015-01-01 (×9): qty 1

## 2015-01-01 MED ORDER — IMIPRAMINE HCL 25 MG PO TABS
25.0000 mg | ORAL_TABLET | Freq: Every day | ORAL | Status: DC
  Administered 2015-01-01 – 2015-01-03 (×3): 25 mg via ORAL
  Filled 2015-01-01 (×4): qty 1

## 2015-01-01 MED ORDER — LEVOFLOXACIN IN D5W 750 MG/150ML IV SOLN
750.0000 mg | Freq: Once | INTRAVENOUS | Status: AC
  Administered 2015-01-01: 750 mg via INTRAVENOUS
  Filled 2015-01-01: qty 150

## 2015-01-01 MED ORDER — TRAZODONE HCL 100 MG PO TABS
100.0000 mg | ORAL_TABLET | Freq: Every evening | ORAL | Status: DC | PRN
  Administered 2015-01-01 – 2015-01-02 (×2): 100 mg via ORAL
  Filled 2015-01-01 (×2): qty 1

## 2015-01-01 MED ORDER — FUROSEMIDE 40 MG PO TABS
40.0000 mg | ORAL_TABLET | Freq: Every morning | ORAL | Status: DC
  Administered 2015-01-02 – 2015-01-03 (×2): 40 mg via ORAL
  Filled 2015-01-01 (×3): qty 1

## 2015-01-01 MED ORDER — DULOXETINE HCL 60 MG PO CPEP
60.0000 mg | ORAL_CAPSULE | Freq: Two times a day (BID) | ORAL | Status: DC
  Administered 2015-01-02 – 2015-01-04 (×4): 60 mg via ORAL
  Filled 2015-01-01: qty 2
  Filled 2015-01-01 (×5): qty 1

## 2015-01-01 MED ORDER — TRAMADOL HCL 50 MG PO TABS
50.0000 mg | ORAL_TABLET | Freq: Three times a day (TID) | ORAL | Status: DC | PRN
  Administered 2015-01-04: 50 mg via ORAL
  Filled 2015-01-01: qty 1

## 2015-01-01 MED ORDER — LEVOFLOXACIN IN D5W 500 MG/100ML IV SOLN: 500.0000 mg | INTRAVENOUS | Status: DC

## 2015-01-01 MED ORDER — ALBUTEROL SULFATE HFA 108 (90 BASE) MCG/ACT IN AERS: 2.0000 | INHALATION_SPRAY | RESPIRATORY_TRACT | Status: DC | PRN

## 2015-01-01 MED ORDER — ENOXAPARIN SODIUM 40 MG/0.4ML ~~LOC~~ SOLN
40.0000 mg | SUBCUTANEOUS | Status: DC
  Administered 2015-01-01 – 2015-01-03 (×3): 40 mg via SUBCUTANEOUS
  Filled 2015-01-01 (×3): qty 0.4

## 2015-01-01 MED ORDER — GABAPENTIN 300 MG PO CAPS
300.0000 mg | ORAL_CAPSULE | Freq: Three times a day (TID) | ORAL | Status: DC
  Administered 2015-01-01 – 2015-01-04 (×8): 300 mg via ORAL
  Filled 2015-01-01 (×8): qty 1

## 2015-01-01 MED ORDER — IOHEXOL 350 MG/ML SOLN
100.0000 mL | Freq: Once | INTRAVENOUS | Status: AC | PRN
  Administered 2015-01-01: 100 mL via INTRAVENOUS

## 2015-01-01 MED ORDER — ESTRADIOL 0.1 MG/GM VA CREA
1.0000 | TOPICAL_CREAM | VAGINAL | Status: DC
  Filled 2015-01-01: qty 42.5

## 2015-01-01 MED ORDER — SODIUM CHLORIDE 0.9 % IJ SOLN
3.0000 mL | Freq: Two times a day (BID) | INTRAMUSCULAR | Status: DC
  Administered 2015-01-01 – 2015-01-03 (×3): 3 mL via INTRAVENOUS

## 2015-01-01 MED ORDER — POTASSIUM CHLORIDE CRYS ER 20 MEQ PO TBCR
20.0000 meq | EXTENDED_RELEASE_TABLET | Freq: Two times a day (BID) | ORAL | Status: DC
  Administered 2015-01-01 – 2015-01-04 (×6): 20 meq via ORAL
  Filled 2015-01-01: qty 2
  Filled 2015-01-01 (×5): qty 1

## 2015-01-01 NOTE — Assessment & Plan Note (Signed)
With your intermittent chest pain radiating toward your back over past 2 days and some today in the morning,  I think you would benefit from troponin and likely D-dimer. Other test such as ct of chest may be beneficial as well. Note your o2 saturation drop from 95-93% concerns me as well.( hx of cad and copd)  Your recent slight elevated troponin is of concern as well.(Reviewed care everywhere and other test results were available)  Charge nurse was notified of pt presentation and pt then went down stairs.

## 2015-01-01 NOTE — Progress Notes (Signed)
Pre visit review using our clinic review tool, if applicable. No additional management support is needed unless otherwise documented below in the visit note. 

## 2015-01-01 NOTE — Progress Notes (Signed)
Subjective:    Patient ID: [name], female    DOB: [birth year], 69 y.o.   MRN: [id number]  HPI   Pt states she was hospitalized at [facility] for respiratoy infection dehydration, wheezing, and chills.   Pt was evaluated overnight and then discharged. Pt had cxr showed spot on her rt lung.  Pt still fatigued, no appetitie, chest congestion and mucous production on coughing.  Pt was not given antibiotic(pt only remembers that levofloxin is only antibiotic that never had reaction to). Benzonatate rx and gave neb tx in hospital but no inhalers on discharge.   This am started with runny nose. Previous light smoker.    Walk down the hall sat went to 92% today and mild dizzy. Some shortness of breath ambulating. Going up and down stairs short of breath.   On and off pain mid chest to mid back past 2 weeks. Pressure like elbow on chest. Last time this was prominent was 8:30-9:00 this morning . (last for a minute). She describes pressure level like elbow in her mid chest.  Past Medical History  Diagnosis Date  . GERD (gastroesophageal reflux disease)   . Arthritis   . Asthma   . Hypertension   . History of colonic polyps     BENIGN  . Fibromyalgia   . S/P radiation therapy 11/12/12 - 12/05/12    right Breast  . CAD (coronary artery disease) CARDIOLOGIST--  DR MCALHANY    mild non-obstructive cad  . COPD (chronic obstructive pulmonary disease)   . History of breast cancer ONCOLOGIST-- DR Jana Hakim---  NO RECURRANCE    DX 07/2012;  LOW GRADE DCIS  ER+PR+  ----  S/P RIGHT LUMPECTOMY WITH NEGATIVE MARGINS/   RADIATION ENDED 11/2012  . History of chronic bronchitis   . Pelvic pain   . Frequency of urination   . Urgency of urination   . Chronic constipation   . History of tachycardia     CONTROLLED  WITH ATENOLOL  . History of basal cell carcinoma excision     X2  . Sinus headache   . H/O hiatal hernia     History   Social History  . Marital Status: Married    Spouse Name: N/A      Number of Children: 3  . Years of Education: N/A   Occupational History  . Retired Therapist, sports    Social History Main Topics  . Smoking status: Former Smoker -- 1.00 packs/day for 15 years    Types: Cigarettes    Quit date: 05/27/2012  . Smokeless tobacco: Never Used  . Alcohol Use: Yes     Comment: occasional  . Drug Use: No  . Sexual Activity: Not on file   Other Topics Concern  . Not on file   Social History Narrative   Exercise-- 2days a week [employer],  [employer], walking     Past Surgical History  Procedure Laterality Date  . Nasal sinus surgery  1985  . Cardiovascular stress test  06-18-2012  DR McALHANY    LOW RISK NUCLEAR STUDY/  SMALL FIXED AREA OF MODERATELY DECREASED UPTAKE IN ANTEROSEPTAL WALL WHICH MAY BE ARTIFACTUAL/  NO ISCHEMIA/  EF 68%  . Transthoracic echocardiogram  06-24-2012    GRADE I DIASTOLIC DYSFUNCTION/  EF 55-60%/  MILD MR  . Right breast bx  08-23-2012  . Removal vocal cord cyst  FEB 2014  . Cardiac catheterization  09-13-2007  DR Lia Foyer    WELL-PRESERVED LVF/  DIFFUSE SCATTERED CORONARY  CALCIFACATION AND ATHEROSCLEROSIS WITHOUT OBSTRUCTION  . Cardiac catheterization  08-04-2010  DR [facility]    NON-OBSTRUCTIVE CAD/  pLAD 40%/  oLAD 30%/  mLAD 30%/  pRCA 30%/  EF 60%  . Colonoscopy  09-29-2010  . Breast lumpectomy Right 10-11-2012    W/ [facility]N BX  . Total abdominal hysterectomy w/ bilateral salpingoophorectomy  1982    W/  APPENDECTOMY  . Tonsillectomy and adenoidectomy  AGE 82  . Orif right ankle  fx  2006  . Right hand surgery  X3  LAST ONE 2009    INCLUDES  ORIF RIGHT 5TH FINGER AND REVISION TWICE  . Cystoscopy with hydrodistension and biopsy N/A 03/06/2014    Procedure: CYSTOSCOPY/HYDRODISTENSION/ INSTILATION OF MARCAINE AND PYRIDIUM;  Surgeon: Ailene Rud, MD;  Location: [facility];  Service: Urology;  Laterality: N/A;    Family History  Problem Relation Age of Onset  . Rectal cancer Mother   . Cancer Mother      rectal,  breast, pancreatic  . Irritable bowel syndrome Son   . Irritable bowel syndrome Daughter   . Heart disease Other   . Arthritis Other   . Cancer Other     breast cancer 1st degree relative <50, lung  . Diabetes Other     1st degree relative   . Hyperlipidemia Other   . Hypertension Other   . Osteoporosis Other   . Coronary artery disease Other 65  . Cancer Maternal Aunt     breast    Allergies  Allergen Reactions  . Clindamycin Hcl Shortness Of Breath and Rash  . Penicillins Anaphylaxis  . Prednisone Shortness Of Breath and Rash  . Codeine Hives and Other (See Comments)    headache  . Fluzone [Flu Virus Vaccine] Other (See Comments)    Local reaction at the site  . Latex Hives  . Pentazocine Lactate Other (See Comments)    HALLUCINATION  . Pneumococcal Vaccine Polyvalent Hives, Swelling and Other (See Comments)    REACTION: redness, swelling, and hives at injection site  . Tamoxifen Nausea And Vomiting and Other (See Comments)    HEADACHE    Current Outpatient Prescriptions on File Prior to Visit  Medication Sig Dispense Refill  . acetaminophen (TYLENOL) 500 MG tablet Take 500 mg by mouth every 6 (six) hours as needed.    Marland Kitchen albuterol (PROVENTIL HFA;VENTOLIN HFA) 108 (90 BASE) MCG/ACT inhaler Inhale 2 puffs into the lungs every 4 (four) hours as needed. 1 Inhaler 5  . aspirin EC 81 MG tablet Take 1 tablet (81 mg total) by mouth daily. 90 tablet 3  . atenolol (TENORMIN) 50 MG tablet Take 1 tablet (50 mg total) by mouth every morning. 90 tablet 1  . atorvastatin (LIPITOR) 20 MG tablet Take 1 tablet (20 mg total) by mouth daily. 90 tablet 3  . DULoxetine (CYMBALTA) 60 MG capsule Take 1 capsule (60 mg total) by mouth 2 (two) times daily. 180 capsule 3  . ELMIRON 100 MG capsule Take 1 capsule by mouth 3 (three) times daily.    Marland Kitchen estradiol (ESTRACE) 0.1 MG/GM vaginal cream Place 1 Applicatorful vaginally 2 (two) times a week. ALTERNATES WITH ESTRADIAL TABLET    .  Estradiol 10 MCG TABS vaginal tablet Place 1 tablet (10 mcg total) vaginally 2 (two) times a week. 8 tablet 11  . fluticasone (FLONASE) 50 MCG/ACT nasal spray Place 1 spray into the nose as needed.     . furosemide (LASIX) 20 MG tablet Take 2  tablets (40 mg total) by mouth every morning. (Patient not taking: Reported on 01/01/2015) 180 tablet 1  . gabapentin (NEURONTIN) 300 MG capsule 300 mg 3 (three) times daily. 1-2 tabs TID    . imipramine (TOFRANIL) 25 MG tablet Take 1 tablet (25 mg total) by mouth at bedtime. 90 tablet 0  . Multiple Vitamin (MULTIVITAMIN) tablet Take 1 tablet by mouth daily.     Marland Kitchen omeprazole (PRILOSEC) 40 MG capsule TAKE 1 BY MOUTH EVERY MORNING 90 capsule 1  . potassium chloride (MICRO-K) 10 MEQ CR capsule Take 2 capsules (20 mEq total) by mouth 2 (two) times daily. 120 capsule 1  . Probiotic Product (ACIDOPHILUS PROBIOTIC COMPLEX PO) Take 1 capsule by mouth daily.    . rosuvastatin (CRESTOR) 10 MG tablet Take 1 tablet (10 mg total) by mouth every evening. (Patient not taking: Reported on 01/01/2015) 90 tablet 3  . traMADol (ULTRAM) 50 MG tablet Take 50-100 mg by mouth 3 (three) times daily as needed.    . traZODone (DESYREL) 100 MG tablet TAKE 2 TABLETS AT BEDTIME AS NEEDED FOR SLEEP 180 tablet 1  . URELLE (URELLE/URISED) 81 MG TABS tablet Take 1 tablet (81 mg total) by mouth 3 (three) times daily. 30 each 2  . zoster vaccine live, PF, (ZOSTAVAX) 62376 UNT/0.65ML injection Inject 19,400 Units into the skin once. 1 each 0   Current Facility-Administered Medications on File Prior to Visit  Medication Dose Route Frequency Provider Last Rate Last Dose  . bupivacaine (MARCAINE) 0.5 % 10 mL, triamcinolone acetonide (KENALOG-40) 40 mg injection   Subcutaneous Once Ailene Rud, MD      . bupivacaine (MARCAINE) 0.5 % 15 mL, phenazopyridine (PYRIDIUM) 400 mg bladder mixture   Bladder Instillation Once Ailene Rud, MD        BP 129/81 mmHg  Pulse 79  Temp(Src) 97.5  F (36.4 C) (Oral)  Ht 5\' 4"  (1.626 m)  Wt 115 lb 12.8 oz (52.527 kg)  BMI 19.87 kg/m2  SpO2 95%      Review of Systems  Constitutional: Positive for fatigue. Negative for fever and chills.  Respiratory: Positive for shortness of breath. Negative for chest tightness and wheezing.        On ambulation some dyspnea and chest pain. But sometimes at rest.  Chest congestion and mucous production at times when coughs.  Cardiovascular: Positive for chest pain. Negative for palpitations and leg swelling.  Gastrointestinal: Negative for abdominal pain.  Musculoskeletal: Positive for back pain.       Intermittent with chest pain.  Hematological: Negative for adenopathy. Does not bruise/bleed easily.       Objective:   Physical Exam   General  Mental Status - Alert. General Appearance - Well groomed. Not in acute distress.  Skin Rashes- No Rashes.  HEENT Head- Normal. Ear Auditory Canal - Left- Normal. Right - Normal.Tympanic Membrane- Left- Normal. Right- Normal. Eye Sclera/Conjunctiva- Left- Normal. Right- Normal. Nose & Sinuses Nasal Mucosa- Left-  Not boggy or Congested. Right-  Not  boggy or Congested. No sinus pressure Mouth & Throat Lips: Upper Lip- Normal: no dryness, cracking, pallor, cyanosis, or vesicular eruption. Lower Lip-Normal: no dryness, cracking, pallor, cyanosis or vesicular eruption. Buccal Mucosa- Bilateral- No Aphthous ulcers. Oropharynx- No Discharge or Erythema. Tonsils: Characteristics- Bilateral- No Erythema or Congestion. Size/Enlargement- Bilateral- No enlargement. Discharge- bilateral-None.  Neck Neck- Supple. No Masses.no jvd, no tracheal deviation.  Chest and Lung Exam Auscultation: Breath Sounds:- even and unlabored,   Cardiovascular Auscultation:Rythm- Regular,  rate and rhythm. Murmurs & Other Heart Sounds:Ausculatation of the heart reveal- No Murmurs.  Lymphatic Head & Neck General Head & Neck Lymphatics: Bilateral: Description- No  Localized lymphadenopathy.  Lower ext- negative homans signs.   Neurologic Cranial Nerve exam:- CN III-XII intact(No nystagmus), symmetric smile. Strength:- 5/5 equal and symmetric strength both upper and lower extremities.           Assessment & Plan:

## 2015-01-01 NOTE — ED Provider Notes (Signed)
CSN: [id number]     Arrival date & time 01/01/15  1221 History   First MD Initiated Contact with Patient 01/01/15 1302     Chief Complaint  Patient presents with  . Chest Pain     (Consider location/radiation/quality/duration/timing/severity/associated sxs/prior Treatment) Patient is a 69 y.o. female presenting with chest pain. The history is provided by the patient.  Chest Pain Pain location:  Substernal area Pain quality: pressure   Pain radiates to:  Does not radiate Pain severity:  Mild Onset quality:  Gradual Timing:  Constant Progression:  Unchanged Chronicity:  New Context: breathing and at rest   Relieved by:  Nothing Worsened by:  Nothing tried Associated symptoms: shortness of breath   Associated symptoms: no abdominal pain, no cough, no fever and not vomiting     Past Medical History  Diagnosis Date  . GERD (gastroesophageal reflux disease)   . Arthritis   . Asthma   . Hypertension   . History of colonic polyps     BENIGN  . Fibromyalgia   . S/P radiation therapy 11/12/12 - 12/05/12    right Breast  . CAD (coronary artery disease) CARDIOLOGIST--  DR MCALHANY    mild non-obstructive cad  . COPD (chronic obstructive pulmonary disease)   . History of breast cancer ONCOLOGIST-- DR Jana Hakim---  NO RECURRANCE    DX 07/2012;  LOW GRADE DCIS  ER+PR+  ----  S/P RIGHT LUMPECTOMY WITH NEGATIVE MARGINS/   RADIATION ENDED 11/2012  . History of chronic bronchitis   . Pelvic pain   . Frequency of urination   . Urgency of urination   . Chronic constipation   . History of tachycardia     CONTROLLED  WITH ATENOLOL  . History of basal cell carcinoma excision     X2  . Sinus headache   . H/O hiatal hernia    Past Surgical History  Procedure Laterality Date  . Nasal sinus surgery  1985  . Cardiovascular stress test  06-18-2012  DR McALHANY    LOW RISK NUCLEAR STUDY/  SMALL FIXED AREA OF MODERATELY DECREASED UPTAKE IN ANTEROSEPTAL WALL WHICH MAY BE ARTIFACTUAL/  NO  ISCHEMIA/  EF 68%  . Transthoracic echocardiogram  06-24-2012    GRADE I DIASTOLIC DYSFUNCTION/  EF 55-60%/  MILD MR  . Right breast bx  08-23-2012  . Removal vocal cord cyst  FEB 2014  . Cardiac catheterization  09-13-2007  DR Lia Foyer    WELL-PRESERVED LVF/  DIFFUSE SCATTERED CORONARY CALCIFACATION AND ATHEROSCLEROSIS WITHOUT OBSTRUCTION  . Cardiac catheterization  08-04-2010  DR [facility]    NON-OBSTRUCTIVE CAD/  pLAD 40%/  oLAD 30%/  mLAD 30%/  pRCA 30%/  EF 60%  . Colonoscopy  09-29-2010  . Breast lumpectomy Right 10-11-2012    W/ SLN BX  . Total abdominal hysterectomy w/ bilateral salpingoophorectomy  1982    W/  APPENDECTOMY  . Tonsillectomy and adenoidectomy  AGE 68  . Orif right ankle  fx  2006  . Right hand surgery  X3  LAST ONE 2009    INCLUDES  ORIF RIGHT 5TH FINGER AND REVISION TWICE  . Cystoscopy with hydrodistension and biopsy N/A 03/06/2014    Procedure: CYSTOSCOPY/HYDRODISTENSION/ INSTILATION OF MARCAINE AND PYRIDIUM;  Surgeon: Ailene Rud, MD;  Location: [facility];  Service: Urology;  Laterality: N/A;   Family History  Problem Relation Age of Onset  . Rectal cancer Mother   . Cancer Mother     rectal,  breast, pancreatic  .  Irritable bowel syndrome Son   . Irritable bowel syndrome Daughter   . Heart disease Other   . Arthritis Other   . Cancer Other     breast cancer 1st degree relative <50, lung  . Diabetes Other     1st degree relative   . Hyperlipidemia Other   . Hypertension Other   . Osteoporosis Other   . Coronary artery disease Other 65  . Cancer Maternal Aunt     breast   History  Substance Use Topics  . Smoking status: Former Smoker -- 1.00 packs/day for 15 years    Types: Cigarettes    Quit date: 05/27/2012  . Smokeless tobacco: Never Used  . Alcohol Use: Yes     Comment: occasional   OB History    Gravida Para Term Preterm AB TAB SAB Ectopic Multiple Living   3 3        3      Review of Systems  Constitutional:  Negative for fever.  Respiratory: Positive for shortness of breath. Negative for cough.   Cardiovascular: Positive for chest pain.  Gastrointestinal: Negative for vomiting and abdominal pain.  All other systems reviewed and are negative.     Allergies  Clindamycin hcl; Penicillins; Prednisone; Codeine; Fluzone; Latex; Pentazocine lactate; Pneumococcal vaccine polyvalent; and Tamoxifen  Home Medications   Prior to Admission medications   Medication Sig Start Date End Date Taking? Authorizing Provider  acetaminophen (TYLENOL) 500 MG tablet Take 500 mg by mouth every 6 (six) hours as needed.    Historical Provider, MD  albuterol (PROVENTIL HFA;VENTOLIN HFA) 108 (90 BASE) MCG/ACT inhaler Inhale 2 puffs into the lungs every 4 (four) hours as needed. 10/13/13   Rosalita Chessman, DO  aspirin EC 81 MG tablet Take 1 tablet (81 mg total) by mouth daily. 05/28/14   Burnell Blanks, MD  atenolol (TENORMIN) 50 MG tablet Take 1 tablet (50 mg total) by mouth every morning. 09/02/14   Rosalita Chessman, DO  atorvastatin (LIPITOR) 20 MG tablet Take 1 tablet (20 mg total) by mouth daily. 09/25/14   Rosalita Chessman, DO  DULoxetine (CYMBALTA) 60 MG capsule Take 1 capsule (60 mg total) by mouth 2 (two) times daily. 04/27/14   Yvonne R Lowne, DO  ELMIRON 100 MG capsule Take 1 capsule by mouth 3 (three) times daily. 06/26/14   Historical Provider, MD  estradiol (ESTRACE) 0.1 MG/GM vaginal cream Place 1 Applicatorful vaginally 2 (two) times a week. ALTERNATES WITH ESTRADIAL TABLET    Historical Provider, MD  Estradiol 10 MCG TABS vaginal tablet Place 1 tablet (10 mcg total) vaginally 2 (two) times a week. 01/06/14   Terrance Mass, MD  fluticasone (FLONASE) 50 MCG/ACT nasal spray Place 1 spray into the nose as needed.  04/09/13   Historical Provider, MD  furosemide (LASIX) 20 MG tablet Take 2 tablets (40 mg total) by mouth every morning. Patient not taking: Reported on 01/01/2015 11/09/14   Rosalita Chessman, DO  gabapentin  (NEURONTIN) 300 MG capsule 300 mg 3 (three) times daily. 1-2 tabs TID 11/28/12   Historical Provider, MD  imipramine (TOFRANIL) 25 MG tablet Take 1 tablet (25 mg total) by mouth at bedtime. 11/23/11   Rosalita Chessman, DO  Multiple Vitamin (MULTIVITAMIN) tablet Take 1 tablet by mouth daily.     Historical Provider, MD  omeprazole (PRILOSEC) 40 MG capsule TAKE 1 BY MOUTH EVERY MORNING 12/03/14   Alferd Apa Lowne, DO  potassium chloride (MICRO-K) 10 MEQ CR  capsule Take 2 capsules (20 mEq total) by mouth 2 (two) times daily. 10/15/14   Chauncey Cruel, MD  Probiotic Product (ACIDOPHILUS PROBIOTIC COMPLEX PO) Take 1 capsule by mouth daily.    Historical Provider, MD  rosuvastatin (CRESTOR) 10 MG tablet Take 1 tablet (10 mg total) by mouth every evening. Patient not taking: Reported on 01/01/2015 05/28/14   Burnell Blanks, MD  traMADol (ULTRAM) 50 MG tablet Take 50-100 mg by mouth 3 (three) times daily as needed. 03/03/14   Rosalita Chessman, DO  traZODone (DESYREL) 100 MG tablet TAKE 2 TABLETS AT BEDTIME AS NEEDED FOR SLEEP 08/24/14   Alferd Apa Lowne, DO  URELLE (URELLE/URISED) 81 MG TABS tablet Take 1 tablet (81 mg total) by mouth 3 (three) times daily. 03/06/14   Ailene Rud, MD  zoster vaccine live, PF, (ZOSTAVAX) 40086 UNT/0.65ML injection Inject 19,400 Units into the skin once. 09/28/14   Meriam Sprague Saguier, PA-C   BP 148/87 mmHg  Pulse 75  Temp(Src) 98.1 F (36.7 C) (Oral)  Resp 18  Ht 5\' 5"  (1.651 m)  Wt 115 lb (52.164 kg)  BMI 19.14 kg/m2  SpO2 100% Physical Exam  Constitutional: She is oriented to person, place, and time. She appears well-developed and well-nourished. No distress.  HENT:  Head: Normocephalic and atraumatic.  Mouth/Throat: Oropharynx is clear and moist.  Eyes: EOM are normal. Pupils are equal, round, and reactive to light.  Neck: Normal range of motion. Neck supple.  Cardiovascular: Normal rate and regular rhythm.  Exam reveals no friction rub.   No murmur  heard. Pulmonary/Chest: Effort normal and breath sounds normal. No respiratory distress. She has no wheezes. She has no rales.  Abdominal: Soft. She exhibits no distension. There is no tenderness. There is no rebound.  Musculoskeletal: Normal range of motion. She exhibits no edema.  Neurological: She is alert and oriented to person, place, and time.  Skin: She is not diaphoretic.  Nursing note and vitals reviewed.   ED Course  Procedures (including critical care time) Labs Review Labs Reviewed  CBC WITH DIFFERENTIAL - Abnormal; Notable for the following:    WBC 17.6 (*)    RDW 15.6 (*)    Platelets 508 (*)    Neutro Abs 14.2 (*)    Monocytes Absolute 1.1 (*)    All other components within normal limits  BASIC METABOLIC PANEL - Abnormal; Notable for the following:    Potassium 2.9 (*)    GFR calc non Af Amer 52 (*)    GFR calc Af Amer 61 (*)    All other components within normal limits  TROPONIN I    Imaging Review Dg Chest 2 View  01/01/2015   CLINICAL DATA:  Chest pain and shortness of breath  EXAM: CHEST  2 VIEW  COMPARISON:  05/01/2013  FINDINGS: Cardiac shadow is stable. The lungs are clear bilaterally. Biapical scarring is again seen. Old healing rib fractures are noted on the right. Apical scarring is noted bilaterally.  IMPRESSION: No acute abnormality is noted. Healing rib fractures are noted on the right.   Electronically Signed   By: Inez Catalina M.D.   On: 01/01/2015 13:15     EKG Interpretation   Date/Time:  Friday January 01 2015 12:32:51 EST Ventricular Rate:  76 PR Interval:  130 QRS Duration: 98 QT Interval:  352 QTC Calculation: 396 R Axis:   79 Text Interpretation:  Normal sinus rhythm Biatrial enlargement ST \\T \ T  wave abnormality, consider inferolateral  ischemia Abnormal ECG Similar to  prior Confirmed by Jackson North  MD, [place] ([id number]) on 01/01/2015 1:02:32 PM      MDM   Final diagnoses:  Shortness of breath  Healthcare-associated pneumonia    30F  presents with some breath and chest pressure. Shortness of breath present for the past 2 weeks. Intermittent CP. Recently admitted to [facility] for asthma exacerbation.  Sent from her PCP's office for intermittent CP, dizziness, shortness of breath. EKG similar to prior, troponin normal, CT shows possible pneumonia. With white count, will admit. Blood cultures drawn, levaquin given.      Evelina Bucy, MD 01/04/15 [phone]

## 2015-01-01 NOTE — H&P (Signed)
[name] is an 69 y.o. female.      Pcp: Garnet Koyanagi   Chief Complaint: not feeling well,  HPI: 69 yo female with FM, CAD , Copd apparently c/o cough and dyspnea requiring admission on 12/25/2014 requiring admission at [facility].  Pt doesn't recall being treated with abx,  And was discharged to home and continued to not feel well and therefore presented to [place] ED where CT scan showed collection of nodules, ? Infection , needs f/u r/o malignancy, pt admits to having left sided chest pain but this that this is not heart related.  Pt stated that nothing appeared to make it better or worse other than cough made it worse.  It has been going on for a while.   Pt will be admitted for ? Pneumonia/Cp.   Past Medical History  Diagnosis Date  . GERD (gastroesophageal reflux disease)   . Arthritis   . Asthma   . Hypertension   . History of colonic polyps     BENIGN  . Fibromyalgia   . S/P radiation therapy 11/12/12 - 12/05/12    right Breast  . CAD (coronary artery disease) CARDIOLOGIST--  DR MCALHANY    mild non-obstructive cad  . COPD (chronic obstructive pulmonary disease)   . History of breast cancer ONCOLOGIST-- DR Jana Hakim---  NO RECURRANCE    DX 07/2012;  LOW GRADE DCIS  ER+PR+  ----  S/P RIGHT LUMPECTOMY WITH NEGATIVE MARGINS/   RADIATION ENDED 11/2012  . History of chronic bronchitis   . Pelvic pain   . Frequency of urination   . Urgency of urination   . Chronic constipation   . History of tachycardia     CONTROLLED  WITH ATENOLOL  . History of basal cell carcinoma excision     X2  . Sinus headache   . H/O hiatal hernia     Past Surgical History  Procedure Laterality Date  . Nasal sinus surgery  1985  . Cardiovascular stress test  06-18-2012  DR McALHANY    LOW RISK NUCLEAR STUDY/  SMALL FIXED AREA OF MODERATELY DECREASED UPTAKE IN ANTEROSEPTAL WALL WHICH MAY BE ARTIFACTUAL/  NO ISCHEMIA/  EF 68%  . Transthoracic echocardiogram  06-24-2012     GRADE I DIASTOLIC DYSFUNCTION/  EF 55-60%/  MILD MR  . Right breast bx  08-23-2012  . Removal vocal cord cyst  FEB 2014  . Cardiac catheterization  09-13-2007  DR Lia Foyer    WELL-PRESERVED LVF/  DIFFUSE SCATTERED CORONARY CALCIFACATION AND ATHEROSCLEROSIS WITHOUT OBSTRUCTION  . Cardiac catheterization  08-04-2010  DR [facility]    NON-OBSTRUCTIVE CAD/  pLAD 40%/  oLAD 30%/  mLAD 30%/  pRCA 30%/  EF 60%  . Colonoscopy  09-29-2010  . Breast lumpectomy Right 10-11-2012    W/ SLN BX  . Total abdominal hysterectomy w/ bilateral salpingoophorectomy  1982    W/  APPENDECTOMY  . Tonsillectomy and adenoidectomy  AGE 74  . Orif right ankle  fx  2006  . Right hand surgery  X3  LAST ONE 2009    INCLUDES  ORIF RIGHT 5TH FINGER AND REVISION TWICE  . Cystoscopy with hydrodistension and biopsy N/A 03/06/2014    Procedure: CYSTOSCOPY/HYDRODISTENSION/ INSTILATION OF MARCAINE AND PYRIDIUM;  Surgeon: Ailene Rud, MD;  Location: [facility];  Service: Urology;  Laterality: N/A;    Family History  Problem Relation Age of Onset  . Rectal cancer Mother   . Cancer Mother  rectal,  breast, pancreatic  . Irritable bowel syndrome Son   . Irritable bowel syndrome Daughter   . Heart disease Other   . Arthritis Other   . Cancer Other     breast cancer 1st degree relative <50, lung  . Diabetes Other     1st degree relative   . Hyperlipidemia Other   . Hypertension Other   . Osteoporosis Other   . Coronary artery disease Other 65  . Cancer Maternal Aunt     breast   Social History:  reports that she quit smoking about 2 years ago. Her smoking use included Cigarettes. She has a 15 pack-year smoking history. She has never used smokeless tobacco. She reports that she does not drink alcohol or use illicit drugs.  Allergies:  Allergies  Allergen Reactions  . Clindamycin Hcl Shortness Of Breath and Rash  . Penicillins Anaphylaxis  . Prednisone Shortness Of Breath and Rash  .  Codeine Hives and Other (See Comments)    headache  . Fluzone [Flu Virus Vaccine] Other (See Comments)    Local reaction at the site  . Latex Hives  . Pentazocine Lactate Other (See Comments)    HALLUCINATION  . Pneumococcal Vaccine Polyvalent Hives, Swelling and Other (See Comments)    REACTION: redness, swelling, and hives at injection site  . Tamoxifen Nausea And Vomiting and Other (See Comments)    HEADACHE    Medications Prior to Admission  Medication Sig Dispense Refill  . acetaminophen (TYLENOL) 500 MG tablet Take 500 mg by mouth every 6 (six) hours as needed.    Marland Kitchen albuterol (PROVENTIL HFA;VENTOLIN HFA) 108 (90 BASE) MCG/ACT inhaler Inhale 2 puffs into the lungs every 4 (four) hours as needed. 1 Inhaler 5  . aspirin EC 81 MG tablet Take 1 tablet (81 mg total) by mouth daily. 90 tablet 3  . atenolol (TENORMIN) 50 MG tablet Take 1 tablet (50 mg total) by mouth every morning. 90 tablet 1  . atorvastatin (LIPITOR) 20 MG tablet Take 1 tablet (20 mg total) by mouth daily. 90 tablet 3  . DULoxetine (CYMBALTA) 60 MG capsule Take 1 capsule (60 mg total) by mouth 2 (two) times daily. 180 capsule 3  . ELMIRON 100 MG capsule Take 1 capsule by mouth 3 (three) times daily.    Marland Kitchen estradiol (ESTRACE) 0.1 MG/GM vaginal cream Place 1 Applicatorful vaginally 2 (two) times a week. ALTERNATES WITH ESTRADIAL TABLET    . Estradiol 10 MCG TABS vaginal tablet Place 1 tablet (10 mcg total) vaginally 2 (two) times a week. 8 tablet 11  . fluticasone (FLONASE) 50 MCG/ACT nasal spray Place 1 spray into the nose as needed.     . furosemide (LASIX) 20 MG tablet Take 2 tablets (40 mg total) by mouth every morning. (Patient not taking: Reported on 01/01/2015) 180 tablet 1  . gabapentin (NEURONTIN) 300 MG capsule 300 mg 3 (three) times daily. 1-2 tabs TID    . imipramine (TOFRANIL) 25 MG tablet Take 1 tablet (25 mg total) by mouth at bedtime. 90 tablet 0  . Multiple Vitamin (MULTIVITAMIN) tablet Take 1 tablet by mouth  daily.     Marland Kitchen omeprazole (PRILOSEC) 40 MG capsule TAKE 1 BY MOUTH EVERY MORNING 90 capsule 1  . potassium chloride (MICRO-K) 10 MEQ CR capsule Take 2 capsules (20 mEq total) by mouth 2 (two) times daily. 120 capsule 1  . Probiotic Product (ACIDOPHILUS PROBIOTIC COMPLEX PO) Take 1 capsule by mouth daily.    . rosuvastatin (  CRESTOR) 10 MG tablet Take 1 tablet (10 mg total) by mouth every evening. (Patient not taking: Reported on 01/01/2015) 90 tablet 3  . traMADol (ULTRAM) 50 MG tablet Take 50-100 mg by mouth 3 (three) times daily as needed.    . traZODone (DESYREL) 100 MG tablet TAKE 2 TABLETS AT BEDTIME AS NEEDED FOR SLEEP 180 tablet 1  . URELLE (URELLE/URISED) 81 MG TABS tablet Take 1 tablet (81 mg total) by mouth 3 (three) times daily. 30 each 2  . zoster vaccine live, PF, (ZOSTAVAX) 76720 UNT/0.65ML injection Inject 19,400 Units into the skin once. 1 each 0    Results for orders placed or performed during the hospital encounter of 01/01/15 (from the past 48 hour(s))  CBC with Differential     Status: Abnormal   Collection Time: 01/01/15 12:40 PM  Result Value Ref Range   WBC 17.6 (H) 4.0 - 10.5 K/uL   RBC 4.33 3.87 - 5.11 MIL/uL   Hemoglobin 12.6 12.0 - 15.0 g/dL   HCT 38.3 36.0 - 46.0 %   MCV 88.5 78.0 - 100.0 fL   MCH 29.1 26.0 - 34.0 pg   MCHC 32.9 30.0 - 36.0 g/dL   RDW 15.6 (H) 11.5 - 15.5 %   Platelets 508 (H) 150 - 400 K/uL   Neutrophils Relative % 75 43 - 77 %   Lymphocytes Relative 13 12 - 46 %   Monocytes Relative 6 3 - 12 %   Eosinophils Relative 0 0 - 5 %   Basophils Relative 0 0 - 1 %   Band Neutrophils 1 0 - 10 %   Metamyelocytes Relative 2 %   Myelocytes 3 %   Neutro Abs 14.2 (H) 1.7 - 7.7 K/uL   Lymphs Abs 2.3 0.7 - 4.0 K/uL   Monocytes Absolute 1.1 (H) 0.1 - 1.0 K/uL   Eosinophils Absolute 0.0 0.0 - 0.7 K/uL   Basophils Absolute 0.0 0.0 - 0.1 K/uL   RBC Morphology ELLIPTOCYTES     Comment: TEARDROP CELLS   WBC Morphology MILD LEFT SHIFT (1-5% METAS, OCC MYELO,  OCC BANDS)     Comment: ATYPICAL LYMPHOCYTES  Basic metabolic panel     Status: Abnormal   Collection Time: 01/01/15 12:40 PM  Result Value Ref Range   Sodium 135 135 - 145 mmol/L    Comment: Please note change in reference range.   Potassium 2.9 (L) 3.5 - 5.1 mmol/L    Comment: Please note change in reference range.   Chloride 100 96 - 112 mEq/L   CO2 26 19 - 32 mmol/L   Glucose, Bld 87 70 - 99 mg/dL   BUN 19 6 - 23 mg/dL   Creatinine, Ser 1.06 0.50 - 1.10 mg/dL   Calcium 9.1 8.4 - 10.5 mg/dL   GFR calc non Af Amer 52 (L) >90 mL/min   GFR calc Af Amer 61 (L) >90 mL/min    Comment: (NOTE) The eGFR has been calculated using the CKD EPI equation. This calculation has not been validated in all clinical situations. eGFR's persistently <90 mL/min signify possible Chronic Kidney Disease.    Anion gap 9 5 - 15  Troponin I     Status: None   Collection Time: 01/01/15 12:40 PM  Result Value Ref Range   Troponin I <0.03 <0.031 ng/mL    Comment:        NO INDICATION OF MYOCARDIAL INJURY. Please note change in reference range.    Dg Chest 2 View  01/01/2015  CLINICAL DATA:  Chest pain and shortness of breath  EXAM: CHEST  2 VIEW  COMPARISON:  05/01/2013  FINDINGS: Cardiac shadow is stable. The lungs are clear bilaterally. Biapical scarring is again seen. Old healing rib fractures are noted on the right. Apical scarring is noted bilaterally.  IMPRESSION: No acute abnormality is noted. Healing rib fractures are noted on the right.   Electronically Signed   By: Inez Catalina M.D.   On: 01/01/2015 13:15   Ct Angio Chest Pe W/cm &/or Wo Cm  01/01/2015   CLINICAL DATA:  Shortness of breath ambulating, chest pain for 2 weeks, cough  EXAM: CT ANGIOGRAPHY CHEST WITH CONTRAST  TECHNIQUE: Multidetector CT imaging of the chest was performed using the standard protocol during bolus administration of intravenous contrast. Multiplanar CT image reconstructions and MIPs were obtained to evaluate the vascular  anatomy.  CONTRAST:  144m OMNIPAQUE IOHEXOL 350 MG/ML SOLN  COMPARISON:  Radiographs from the same day and earlier  FINDINGS: The SVC is patent. RV/LV ratio normal. Satisfactory opacification of pulmonary arteries noted, and there is no evidence of pulmonary emboli. Patent superior and inferior pulmonary veins bilaterally. Adequate contrast opacification of the thoracic aorta with no evidence of dissection, aneurysm, or stenosis. There is classic 3-vessel brachiocephalic arch anatomy without proximal stenosis.  No pleural or pericardial effusion. No hilar or mediastinal adenopathy. A cluster of small nodular airspace opacities measuring 6 mm or less in the posterior right upper lobe image 32-46/6. Left lung clear. Probable bone island in T8 stable since 09/10/2007. Thoracic spine otherwise unremarkable. Sternum intact. Visualized portions of upper abdomen unremarkable.  Review of the MIP images confirms the above findings.  IMPRESSION: 1. Negative for acute PE or thoracic aortic dissection. 2. Small cluster of nodular opacities in the posterior right upper lobe, possibly inflammatory/infectious. If the patient is at high risk for bronchogenic carcinoma, follow-up chest CT at 6-12 months is recommended. If the patient is at low risk for bronchogenic carcinoma, follow-up chest CT at 12 months is recommended. This recommendation follows the consensus statement: Guidelines for Management of Small Pulmonary Nodules Detected on CT Scans: A Statement from the [employer] published in Radiology 2005;237:395-400.   Electronically Signed   By: DArne ClevelandM.D.   On: 01/01/2015 15:32    Review of Systems  Constitutional: Positive for chills. Negative for fever, weight loss, malaise/fatigue and diaphoresis.  HENT: Negative for congestion, ear discharge, ear pain, hearing loss, nosebleeds, sore throat and tinnitus.   Eyes: Negative for blurred vision, double vision, photophobia, pain, discharge and redness.   Respiratory: Negative for cough, hemoptysis, sputum production, shortness of breath, wheezing and stridor.   Cardiovascular: Positive for chest pain. Negative for palpitations, orthopnea, claudication, leg swelling and PND.  Gastrointestinal: Positive for nausea. Negative for heartburn, vomiting, abdominal pain, diarrhea, constipation, blood in stool and melena.  Genitourinary: Negative for dysuria, urgency, frequency, hematuria and flank pain.  Musculoskeletal: Positive for myalgias. Negative for back pain, joint pain, falls and neck pain.  Skin: Negative for itching and rash.  Neurological: Negative for dizziness, tingling, tremors, sensory change, speech change, focal weakness, seizures, loss of consciousness, weakness and headaches.  Endo/Heme/Allergies: Negative for environmental allergies and polydipsia. Does not bruise/bleed easily.  Psychiatric/Behavioral: Negative for depression, suicidal ideas, hallucinations, memory loss and substance abuse. The patient is not nervous/anxious and does not have insomnia.     Blood pressure 131/69, pulse 82, temperature 98.4 F (36.9 C), temperature source Oral, resp. rate 18, height _0  (  1.651 m), weight 52.164 kg (115 lb), SpO2 97 %. Physical Exam  Constitutional: She is oriented to person, place, and time. She appears well-developed and well-nourished.  HENT:  Head: Normocephalic and atraumatic.  Eyes: Conjunctivae and EOM are normal. Pupils are equal, round, and reactive to light.  Neck: Normal range of motion. Neck supple. No JVD present. No tracheal deviation present. No thyromegaly present.  Cardiovascular: Normal rate and regular rhythm.  Exam reveals no gallop and no friction rub.   No murmur heard. Respiratory: Effort normal. No respiratory distress. She has no wheezes. She has rales. She exhibits no tenderness.  Slight crackle in the right upper lung  GI: Soft. Bowel sounds are normal. She exhibits no distension. There is no tenderness.  There is no rebound and no guarding.  Musculoskeletal: Normal range of motion. She exhibits no edema or tenderness.  Lymphadenopathy:    She has no cervical adenopathy.  Neurological: She is alert and oriented to person, place, and time. She has normal reflexes. No cranial nerve deficit. Coordination normal.  Skin: Skin is warm and dry. No erythema.  Psychiatric: She has a normal mood and affect. Her behavior is normal. Judgment and thought content normal.     Assessment/Plan Lung nodules RUL ?Pneumonia Levaquin 546m iv qday, vanco iv received in ER. Pt is not producing sputum, blood cx x2 sets Consider pulmonary consultation for f/u of lung nodules vs f/u CT scan  Cp Tele Check trop If negative consider stress testing  Copd  Cont current breathing tx  KJani Gravel1/07/2015, 7:30 PM

## 2015-01-01 NOTE — Progress Notes (Signed)
Patient refer by PCP for evaluation of Chest pain, pressure, dyspnea. WBC elevated. CT angio negative for PE. admitt to rule out. Treatment for PNA. She was admitted to [facility] last week.  Sheri Sova, Md.

## 2015-01-01 NOTE — ED Notes (Signed)
Pt was given snack after EDP Mingo Amber approval

## 2015-01-01 NOTE — ED Notes (Signed)
Pt was sent from PCP in the building-pt c/o feeling like she had a cold x 2 weeks-CP with SOB-states she was also seen in [place] last week

## 2015-01-01 NOTE — Patient Instructions (Addendum)
With your intermittent chest pain radiating toward your back over past 2 weeks and some today in the morning,  I think you would benefit from troponin and likely D-dimer. Other test such as ct of chest may be beneficial as well. Note your o2 saturation drop from 95-93% concerns me as well.( hx of cad and copd)  Your recent slight elevated troponin is of concern as well.(Reviewed care everywhere and other test results were available).  Please call me on Monday am and update me how you feel.    After work up done for above would consider maybe rx of levofloxin and 2 inhalers(for her uri and possible copd excacerbation). But would like to see full work up first.   Note I did talk to ED physician and I was mistaken that recent troponin mild increase. ED MD pointed out that was not on novant most recent evaluation.

## 2015-01-02 DIAGNOSIS — R0789 Other chest pain: Secondary | ICD-10-CM

## 2015-01-02 DIAGNOSIS — J189 Pneumonia, unspecified organism: Principal | ICD-10-CM

## 2015-01-02 LAB — COMPREHENSIVE METABOLIC PANEL
ALT: 13 U/L (ref 0–35)
ANION GAP: 10 (ref 5–15)
AST: 13 U/L (ref 0–37)
Albumin: 3 g/dL — ABNORMAL LOW (ref 3.5–5.2)
Alkaline Phosphatase: 86 U/L (ref 39–117)
BUN: 14 mg/dL (ref 6–23)
CALCIUM: 8.8 mg/dL (ref 8.4–10.5)
CO2: 22 mmol/L (ref 19–32)
Chloride: 107 mEq/L (ref 96–112)
Creatinine, Ser: 1.01 mg/dL (ref 0.50–1.10)
GFR calc non Af Amer: 55 mL/min — ABNORMAL LOW (ref >90)
GFR, EST AFRICAN AMERICAN: 64 mL/min — AB (ref >90)
GLUCOSE: 90 mg/dL (ref 70–99)
POTASSIUM: 4.2 mmol/L (ref 3.5–5.1)
Sodium: 139 mmol/L (ref 135–145)
Total Bilirubin: 0.5 mg/dL (ref 0.3–1.2)
Total Protein: 6 g/dL (ref 6.0–8.3)

## 2015-01-02 LAB — CBC WITH DIFFERENTIAL/PLATELET
BASOS ABS: 0 10*3/uL (ref 0.0–0.1)
Basophils Relative: 0 % (ref 0–1)
Eosinophils Absolute: 0.1 10*3/uL (ref 0.0–0.7)
Eosinophils Relative: 1 % (ref 0–5)
HCT: 33.8 % — ABNORMAL LOW (ref 36.0–46.0)
HEMOGLOBIN: 10.9 g/dL — AB (ref 12.0–15.0)
Lymphocytes Relative: 26 % (ref 12–46)
Lymphs Abs: 2.7 10*3/uL (ref 0.7–4.0)
MCH: 28.4 pg (ref 26.0–34.0)
MCHC: 32.2 g/dL (ref 30.0–36.0)
MCV: 88 fL (ref 78.0–100.0)
MONO ABS: 1.1 10*3/uL — AB (ref 0.1–1.0)
Monocytes Relative: 10 % (ref 3–12)
NEUTROS PCT: 63 % (ref 43–77)
Neutro Abs: 6.7 10*3/uL (ref 1.7–7.7)
PLATELETS: 364 10*3/uL (ref 150–400)
RBC: 3.84 MIL/uL — ABNORMAL LOW (ref 3.87–5.11)
RDW: 16 % — AB (ref 11.5–15.5)
WBC: 10.6 10*3/uL — AB (ref 4.0–10.5)

## 2015-01-02 LAB — TROPONIN I

## 2015-01-02 LAB — STREP PNEUMONIAE URINARY ANTIGEN: STREP PNEUMO URINARY ANTIGEN: NEGATIVE

## 2015-01-02 MED ORDER — LEVOFLOXACIN IN D5W 500 MG/100ML IV SOLN
500.0000 mg | INTRAVENOUS | Status: DC
  Administered 2015-01-02: 500 mg via INTRAVENOUS
  Filled 2015-01-02 (×2): qty 100

## 2015-01-02 MED ORDER — POLYETHYLENE GLYCOL 3350 17 G PO PACK
17.0000 g | PACK | Freq: Every day | ORAL | Status: DC
  Administered 2015-01-02 – 2015-01-04 (×3): 17 g via ORAL
  Filled 2015-01-02 (×3): qty 1

## 2015-01-02 NOTE — Progress Notes (Signed)
PROGRESS NOTE  [name] EVO:[id number] DOB: 1946-02-20 DOA: 01/01/2015 PCP: Garnet Koyanagi, DO  Assessment/Plan: Lung nodules RUL +/- Pneumonia Levaquin 500mg  iv qday, vanco iv received in ER. Pt is not producing sputum, blood cx x2 sets Outpatient CT follow up  Cp Tele Check trop If negative consider stress testing outpatient  Copd  Cont current breathing tx  Code Status: full Family Communication: patient Disposition Plan:    Consultants:    Procedures:      HPI/Subjective: Feeling a lot better since admission  Objective: Filed Vitals:   01/02/15 0800  BP: 110/97  Pulse: 85  Temp: 98.1 F (36.7 C)  Resp: 16    Intake/Output Summary (Last 24 hours) at 01/02/15 0924 Last data filed at 01/02/15 0830  Gross per 24 hour  Intake    360 ml  Output    150 ml  Net    210 ml   Filed Weights   01/01/15 1231 01/02/15 0400  Weight: 52.164 kg (115 lb) 51.755 kg (114 lb 1.6 oz)    Exam:   General:  A+Ox3, NAD  Cardiovascular: rrr  Respiratory: wheezing RUL  Abdomen: +BS, soft  Musculoskeletal: no edmea   Data Reviewed: Basic Metabolic Panel:  Recent Labs Lab 01/01/15 1240 01/02/15 0335  NA 135 139  K 2.9* 4.2  CL 100 107  CO2 26 22  GLUCOSE 87 90  BUN 19 14  CREATININE 1.06 1.01  CALCIUM 9.1 8.8   Liver Function Tests:  Recent Labs Lab 01/02/15 0335  AST 13  ALT 13  ALKPHOS 86  BILITOT 0.5  PROT 6.0  ALBUMIN 3.0*   No results for input(s): LIPASE, AMYLASE in the last 168 hours. No results for input(s): AMMONIA in the last 168 hours. CBC:  Recent Labs Lab 01/01/15 1240 01/02/15 0335  WBC 17.6* 10.6*  NEUTROABS 14.2* 6.7  HGB 12.6 10.9*  HCT 38.3 33.8*  MCV 88.5 88.0  PLT 508* 364   Cardiac Enzymes:  Recent Labs Lab 01/01/15 1240 01/02/15 0335  TROPONINI <0.03 <0.03   BNP (last 3 results) No results for input(s): PROBNP in the last 8760 hours. CBG: No results for input(s): GLUCAP in the last 168  hours.  No results found for this or any previous visit (from the past 240 hour(s)).   Studies: Dg Chest 2 View  01/01/2015   CLINICAL DATA:  Chest pain and shortness of breath  EXAM: CHEST  2 VIEW  COMPARISON:  05/01/2013  FINDINGS: Cardiac shadow is stable. The lungs are clear bilaterally. Biapical scarring is again seen. Old healing rib fractures are noted on the right. Apical scarring is noted bilaterally.  IMPRESSION: No acute abnormality is noted. Healing rib fractures are noted on the right.   Electronically Signed   By: Inez Catalina M.D.   On: 01/01/2015 13:15   Ct Angio Chest Pe W/cm &/or Wo Cm  01/01/2015   CLINICAL DATA:  Shortness of breath ambulating, chest pain for 2 weeks, cough  EXAM: CT ANGIOGRAPHY CHEST WITH CONTRAST  TECHNIQUE: Multidetector CT imaging of the chest was performed using the standard protocol during bolus administration of intravenous contrast. Multiplanar CT image reconstructions and MIPs were obtained to evaluate the vascular anatomy.  CONTRAST:  169mL OMNIPAQUE IOHEXOL 350 MG/ML SOLN  COMPARISON:  Radiographs from the same day and earlier  FINDINGS: The SVC is patent. RV/LV ratio normal. Satisfactory opacification of pulmonary arteries noted, and there is no evidence of pulmonary emboli. Patent superior and inferior pulmonary veins  bilaterally. Adequate contrast opacification of the thoracic aorta with no evidence of dissection, aneurysm, or stenosis. There is classic 3-vessel brachiocephalic arch anatomy without proximal stenosis.  No pleural or pericardial effusion. No hilar or mediastinal adenopathy. A cluster of small nodular airspace opacities measuring 6 mm or less in the posterior right upper lobe image 32-46/6. Left lung clear. Probable bone island in T8 stable since 09/10/2007. Thoracic spine otherwise unremarkable. Sternum intact. Visualized portions of upper abdomen unremarkable.  Review of the MIP images confirms the above findings.  IMPRESSION: 1. Negative for  acute PE or thoracic aortic dissection. 2. Small cluster of nodular opacities in the posterior right upper lobe, possibly inflammatory/infectious. If the patient is at high risk for bronchogenic carcinoma, follow-up chest CT at 6-12 months is recommended. If the patient is at low risk for bronchogenic carcinoma, follow-up chest CT at 12 months is recommended. This recommendation follows the consensus statement: Guidelines for Management of Small Pulmonary Nodules Detected on CT Scans: A Statement from the [place] as published in Radiology 2005;237:395-400.   Electronically Signed   By: Arne Cleveland M.D.   On: 01/01/2015 15:32    Scheduled Meds: . aspirin EC  81 mg Oral Daily  . atenolol  50 mg Oral q morning - 10a  . atorvastatin  20 mg Oral Daily  . DULoxetine  60 mg Oral BID  . enoxaparin (LOVENOX) injection  40 mg Subcutaneous Q24H  . [START ON 01/04/2015] estradiol  1 Applicatorful Vaginal Once per day on Mon Thu  . fluticasone  1 spray Each Nare Daily  . furosemide  40 mg Oral q morning - 10a  . gabapentin  300 mg Oral TID  . imipramine  25 mg Oral QHS  . levofloxacin (LEVAQUIN) IV  750 mg Intravenous Q24H  . multivitamin with minerals  1 tablet Oral Daily  . pantoprazole  40 mg Oral Daily  . pentosan polysulfate  100 mg Oral TID  . polyethylene glycol  17 g Oral Daily  . potassium chloride  20 mEq Oral BID  . sodium chloride  3 mL Intravenous Q12H  . sodium chloride  3 mL Intravenous Q12H  . URELLE  1 tablet Oral TID   Continuous Infusions: . 0.9 % NaCl with KCl 20 mEq / L 50 mL/hr at 01/01/15 2238   Antibiotics Given (last 72 hours)    None      Active Problems:   Chest pain   Pneumonia    Time spent: 25 min    Eulogio Bear  [facility] Pager [phone]. If 7PM-7AM, please contact night-coverage at [email or web address], password [facility] 01/02/2015, 9:24 AM  LOS: 1 day

## 2015-01-03 MED ORDER — LEVOFLOXACIN 750 MG PO TABS
750.0000 mg | ORAL_TABLET | ORAL | Status: DC
  Administered 2015-01-03: 750 mg via ORAL
  Filled 2015-01-03 (×2): qty 1

## 2015-01-03 NOTE — Progress Notes (Signed)
SATURATION QUALIFICATIONS: (This note is used to comply with regulatory documentation for home oxygen)  Patient Saturations on Room Air at Rest = 99%  Patient Saturations on Room Air while Ambulating = 100%  

## 2015-01-03 NOTE — Progress Notes (Signed)
PROGRESS NOTE  [name] GLO:[id number] DOB: 01-30-1946 DOA: 01/01/2015 PCP: Garnet Koyanagi, DO  Assessment/Plan: Lung nodules RUL +/- Pneumonia Levaquin - change to oral Pt is not producing sputum, blood cx x2 sets Outpatient CT follow up  Cp Tele CE Negative-  stress testing outpatient  Copd  Cont current breathing tx  Code Status: full Family Communication: patient Disposition Plan:    Consultants:    Procedures:      HPI/Subjective: Continues to improve  Objective: Filed Vitals:   01/03/15 0500  BP: 128/51  Pulse: 83  Temp: 98.1 F (36.7 C)  Resp: 12    Intake/Output Summary (Last 24 hours) at 01/03/15 1257 Last data filed at 01/03/15 0800  Gross per 24 hour  Intake    400 ml  Output   1250 ml  Net   -850 ml   Filed Weights   01/01/15 1231 01/02/15 0400 01/03/15 0500  Weight: 52.164 kg (115 lb) 51.755 kg (114 lb 1.6 oz) 52.3 kg (115 lb 4.8 oz)    Exam:   General:  A+Ox3, NAD  Cardiovascular: rrr  Respiratory: clear, no wheezing  Abdomen: +BS, soft  Musculoskeletal: no edmea   Data Reviewed: Basic Metabolic Panel:  Recent Labs Lab 01/01/15 1240 01/02/15 0335  NA 135 139  K 2.9* 4.2  CL 100 107  CO2 26 22  GLUCOSE 87 90  BUN 19 14  CREATININE 1.06 1.01  CALCIUM 9.1 8.8   Liver Function Tests:  Recent Labs Lab 01/02/15 0335  AST 13  ALT 13  ALKPHOS 86  BILITOT 0.5  PROT 6.0  ALBUMIN 3.0*   No results for input(s): LIPASE, AMYLASE in the last 168 hours. No results for input(s): AMMONIA in the last 168 hours. CBC:  Recent Labs Lab 01/01/15 1240 01/02/15 0335  WBC 17.6* 10.6*  NEUTROABS 14.2* 6.7  HGB 12.6 10.9*  HCT 38.3 33.8*  MCV 88.5 88.0  PLT 508* 364   Cardiac Enzymes:  Recent Labs Lab 01/01/15 1240 01/02/15 0335  TROPONINI <0.03 <0.03   BNP (last 3 results) No results for input(s): PROBNP in the last 8760 hours. CBG: No results for input(s): GLUCAP in the last 168 hours.  Recent Results  (from the past 240 hour(s))  Culture, blood (routine x 2)     Status: None (Preliminary result)   Collection Time: 01/01/15  3:20 PM  Result Value Ref Range Status   Specimen Description BLOOD LEFT AC  Final   Special Requests BOTTLES DRAWN AEROBIC AND ANAEROBIC 5CC EACH  Final   Culture   Final           BLOOD CULTURE RECEIVED NO GROWTH TO DATE CULTURE WILL BE HELD FOR 5 DAYS BEFORE ISSUING A FINAL NEGATIVE REPORT Performed at [facility]    Report Status PENDING  Incomplete  Culture, blood (routine x 2)     Status: None (Preliminary result)   Collection Time: 01/01/15  4:15 PM  Result Value Ref Range Status   Specimen Description BLOOD RIGHT AC  Final   Special Requests BOTTLES DRAWN AEROBIC AND ANAEROBIC 5CC EACH  Final   Culture   Final           BLOOD CULTURE RECEIVED NO GROWTH TO DATE CULTURE WILL BE HELD FOR 5 DAYS BEFORE ISSUING A FINAL NEGATIVE REPORT Performed at [facility]    Report Status PENDING  Incomplete     Studies: Ct Angio Chest Pe W/cm &/or Wo Cm  01/01/2015  CLINICAL DATA:  Shortness of breath ambulating, chest pain for 2 weeks, cough  EXAM: CT ANGIOGRAPHY CHEST WITH CONTRAST  TECHNIQUE: Multidetector CT imaging of the chest was performed using the standard protocol during bolus administration of intravenous contrast. Multiplanar CT image reconstructions and MIPs were obtained to evaluate the vascular anatomy.  CONTRAST:  122mL OMNIPAQUE IOHEXOL 350 MG/ML SOLN  COMPARISON:  Radiographs from the same day and earlier  FINDINGS: The SVC is patent. RV/LV ratio normal. Satisfactory opacification of pulmonary arteries noted, and there is no evidence of pulmonary emboli. Patent superior and inferior pulmonary veins bilaterally. Adequate contrast opacification of the thoracic aorta with no evidence of dissection, aneurysm, or stenosis. There is classic 3-vessel brachiocephalic arch anatomy without proximal stenosis.  No pleural or pericardial effusion. No  hilar or mediastinal adenopathy. A cluster of small nodular airspace opacities measuring 6 mm or less in the posterior right upper lobe image 32-46/6. Left lung clear. Probable bone island in T8 stable since 09/10/2007. Thoracic spine otherwise unremarkable. Sternum intact. Visualized portions of upper abdomen unremarkable.  Review of the MIP images confirms the above findings.  IMPRESSION: 1. Negative for acute PE or thoracic aortic dissection. 2. Small cluster of nodular opacities in the posterior right upper lobe, possibly inflammatory/infectious. If the patient is at high risk for bronchogenic carcinoma, follow-up chest CT at 6-12 months is recommended. If the patient is at low risk for bronchogenic carcinoma, follow-up chest CT at 12 months is recommended. This recommendation follows the consensus statement: Guidelines for Management of Small Pulmonary Nodules Detected on CT Scans: A Statement from the [place] as published in Radiology 2005;237:395-400.   Electronically Signed   By: Arne Cleveland M.D.   On: 01/01/2015 15:32    Scheduled Meds: . aspirin EC  81 mg Oral Daily  . atenolol  50 mg Oral q morning - 10a  . atorvastatin  20 mg Oral Daily  . DULoxetine  60 mg Oral BID  . enoxaparin (LOVENOX) injection  40 mg Subcutaneous Q24H  . [START ON 01/04/2015] estradiol  1 Applicatorful Vaginal Once per day on Mon Thu  . fluticasone  1 spray Each Nare Daily  . furosemide  40 mg Oral q morning - 10a  . gabapentin  300 mg Oral TID  . imipramine  25 mg Oral QHS  . levofloxacin  750 mg Oral Q48H  . multivitamin with minerals  1 tablet Oral Daily  . pantoprazole  40 mg Oral Daily  . pentosan polysulfate  100 mg Oral TID  . polyethylene glycol  17 g Oral Daily  . potassium chloride  20 mEq Oral BID  . sodium chloride  3 mL Intravenous Q12H  . sodium chloride  3 mL Intravenous Q12H  . URELLE  1 tablet Oral TID   Continuous Infusions:   Antibiotics Given (last 72 hours)    Date/Time  Action Medication Dose Rate   01/02/15 0935 Given   URELLE (URELLE/URISED) 81 MG tablet 81 mg 81 mg    01/02/15 1524 Given   URELLE (URELLE/URISED) 81 MG tablet 81 mg 81 mg    01/02/15 1612 Given   levofloxacin (LEVAQUIN) IVPB 500 mg 500 mg 100 mL/hr   01/03/15 1014 Given   levofloxacin (LEVAQUIN) tablet 750 mg 750 mg       Active Problems:   Chest pain   Pneumonia    Time spent: 25 min    Ming Mcmannis, [facility] Pager [phone]. If 7PM-7AM, please contact night-coverage at  [email or web address], password [facility] 01/03/2015, 12:57 PM  LOS: 2 days

## 2015-01-04 LAB — CBC
HEMATOCRIT: 34.3 % — AB (ref 36.0–46.0)
Hemoglobin: 10.9 g/dL — ABNORMAL LOW (ref 12.0–15.0)
MCH: 28.2 pg (ref 26.0–34.0)
MCHC: 31.8 g/dL (ref 30.0–36.0)
MCV: 88.6 fL (ref 78.0–100.0)
Platelets: 317 10*3/uL (ref 150–400)
RBC: 3.87 MIL/uL (ref 3.87–5.11)
RDW: 16.2 % — AB (ref 11.5–15.5)
WBC: 9.4 10*3/uL (ref 4.0–10.5)

## 2015-01-04 LAB — BASIC METABOLIC PANEL
Anion gap: 11 (ref 5–15)
BUN: 16 mg/dL (ref 6–23)
CO2: 23 mmol/L (ref 19–32)
Calcium: 9.3 mg/dL (ref 8.4–10.5)
Chloride: 106 mEq/L (ref 96–112)
Creatinine, Ser: 1.06 mg/dL (ref 0.50–1.10)
GFR, EST AFRICAN AMERICAN: 61 mL/min — AB (ref >90)
GFR, EST NON AFRICAN AMERICAN: 52 mL/min — AB (ref >90)
Glucose, Bld: 140 mg/dL — ABNORMAL HIGH (ref 70–99)
Potassium: 4.5 mmol/L (ref 3.5–5.1)
Sodium: 140 mmol/L (ref 135–145)

## 2015-01-04 MED ORDER — TEMAZEPAM 7.5 MG PO CAPS: 7.5000 mg | ORAL_CAPSULE | Freq: Every evening | ORAL | Status: DC | PRN

## 2015-01-04 MED ORDER — LEVOFLOXACIN 750 MG PO TABS: 750.0000 mg | ORAL_TABLET | ORAL | Status: DC

## 2015-01-04 MED ORDER — FUROSEMIDE 20 MG PO TABS: 20.0000 mg | ORAL_TABLET | Freq: Every morning | ORAL | Status: DC

## 2015-01-04 MED ORDER — POLYETHYLENE GLYCOL 3350 17 G PO PACK: 17.0000 g | PACK | Freq: Every day | ORAL | Status: DC

## 2015-01-04 MED ORDER — POTASSIUM CHLORIDE CRYS ER 20 MEQ PO TBCR: 20.0000 meq | EXTENDED_RELEASE_TABLET | Freq: Every day | ORAL | Status: DC

## 2015-01-04 NOTE — Discharge Summary (Signed)
Physician Discharge Summary  [name] YTK:[id number] DOB: [birth year] DOA: 01/01/2015  PCP: Garnet Koyanagi, DO  Admit date: 01/01/2015 Discharge date: 01/04/2015  Time spent: 35 minutes  Recommendations for Outpatient Follow-up:  1. BP check 1 week 2. BMP 1 week 3. Outpatient CT follow up for possible lung nodule  Discharge Diagnoses:  Active Problems:   Chest pain   Pneumonia   Discharge Condition: improved  Diet recommendation: cardiac  Filed Weights   01/02/15 0400 01/03/15 0500 01/04/15 5573  Weight: 51.755 kg (114 lb 1.6 oz) 52.3 kg (115 lb 4.8 oz) 52.164 kg (115 lb)    History of present illness:  69 yo female with FM, CAD , Copd apparently c/o cough and dyspnea requiring admission on 12/25/2014 requiring admission at [facility]. Pt doesn't recall being treated with abx, And was discharged to home and continued to not feel well and therefore presented to [place] ED where CT scan showed collection of nodules, ? Infection , needs f/u r/o malignancy, pt admits to having left sided chest pain but this that this is not heart related. Pt stated that nothing appeared to make it better or worse other than cough made it worse. It has been going on for a while. Pt will be admitted for ? Pneumonia/Cp.   Hospital Course:  Lung nodules RUL +/- Pneumonia Levaquin - change to oral (use restoril instead of trazadone while on levaquin due to QtC effects) Pt is not producing sputum, blood cx x2 negative Outpatient CT follow up  Cp Tele CE Negative- stress testing outpatient  Copd  Cont current breathing tx  Procedures:  none    Discharge Exam: Filed Vitals:   01/04/15 0627  BP: 97/36  Pulse: 87  Temp: 97.9 F (36.6 C)  Resp: 18    General: feeling better, NAD Cardiovascular: rrr Respiratory: no wheezing  Discharge Instructions   Discharge Instructions    Diet - low sodium heart healthy    Complete by:  As directed       Increase activity slowly    Complete by:  As directed           Discharge Medication List as of 01/04/2015  9:48 AM    START taking these medications   Details  levofloxacin (LEVAQUIN) 750 MG tablet Take 1 tablet (750 mg total) by mouth every other day., Starting 01/04/2015, Until Discontinued, Print    polyethylene glycol (MIRALAX / GLYCOLAX) packet Take 17 g by mouth daily., Starting 01/04/2015, Until Discontinued, No Print    temazepam (RESTORIL) 7.5 MG capsule Take 1 capsule (7.5 mg total) by mouth at bedtime as needed for sleep. If needed while taking levaquin, Starting 01/04/2015, Until Discontinued, Print      CONTINUE these medications which have CHANGED   Details  furosemide (LASIX) 20 MG tablet Take 1 tablet (20 mg total) by mouth every morning., Starting 01/04/2015, Until Discontinued, Print      CONTINUE these medications which have NOT CHANGED   Details  acetaminophen (TYLENOL) 500 MG tablet Take 1,000 mg by mouth every 6 (six) hours as needed for mild pain or headache. , Until Discontinued, Historical Med    albuterol (PROVENTIL HFA;VENTOLIN HFA) 108 (90 BASE) MCG/ACT inhaler Inhale 2 puffs into the lungs every 4 (four) hours as needed., Starting 10/13/2013, Until Discontinued, Normal    aspirin EC 81 MG tablet Take 1 tablet (81 mg total) by mouth daily., Starting 05/28/2014, Until Discontinued, No Print    atenolol (TENORMIN) 50  MG tablet Take 1 tablet (50 mg total) by mouth every morning., Starting 09/02/2014, Until Discontinued, Normal    atorvastatin (LIPITOR) 20 MG tablet Take 1 tablet (20 mg total) by mouth daily., Starting 09/25/2014, Until Discontinued, Normal    DULoxetine (CYMBALTA) 60 MG capsule Take 1 capsule (60 mg total) by mouth 2 (two) times daily., Starting 04/27/2014, Until Discontinued, Normal    ELMIRON 100 MG capsule Take 1 capsule by mouth 3 (three) times daily as needed. When cystitis symptoms arises., Starting 06/26/2014, Until Discontinued, Historical Med     estradiol (ESTRACE) 0.1 MG/GM vaginal cream Place 1 Applicatorful vaginally 2 (two) times a week. ALTERNATES WITH ESTRADIAL TABLET, Until Discontinued, Historical Med    fluticasone (FLONASE) 50 MCG/ACT nasal spray Place 1 spray into the nose as needed for allergies. , Starting 04/09/2013, Until Discontinued, Historical Med    gabapentin (NEURONTIN) 300 MG capsule 300 mg 2 (two) times daily. , Starting 11/28/2012, Until Discontinued, Historical Med    imipramine (TOFRANIL) 25 MG tablet Take 1 tablet (25 mg total) by mouth at bedtime., Starting 11/23/2011, Until Discontinued, Normal    Multiple Vitamin (MULTIVITAMIN) tablet Take 1 tablet by mouth daily. , Until Discontinued, Historical Med    omeprazole (PRILOSEC) 40 MG capsule Take 40 mg by mouth 2 (two) times daily., Until Discontinued, Historical Med    potassium chloride (MICRO-K) 10 MEQ CR capsule Take 2 capsules (20 mEq total) by mouth 2 (two) times daily., Starting 10/15/2014, Until Discontinued, Normal    Probiotic Product (ACIDOPHILUS PROBIOTIC COMPLEX PO) Take 1 capsule by mouth daily., Until Discontinued, Historical Med    traMADol (ULTRAM) 50 MG tablet Take 50-100 mg by mouth 3 (three) times daily as needed for moderate pain. , Starting 03/03/2014, Until Discontinued, Historical Med    URELLE (URELLE/URISED) 81 MG TABS tablet Take 1 tablet (81 mg total) by mouth 3 (three) times daily., Starting 03/06/2014, Until Discontinued, Print      STOP taking these medications     Estradiol 10 MCG TABS vaginal tablet      traZODone (DESYREL) 100 MG tablet        Allergies  Allergen Reactions  . Clindamycin Hcl Shortness Of Breath and Rash  . Penicillins Anaphylaxis  . Prednisone Shortness Of Breath and Rash  . Codeine Hives and Other (See Comments)    headache  . Fluzone [Flu Virus Vaccine] Other (See Comments)    Local reaction at the site  . Latex Hives  . Pentazocine Lactate Other (See Comments)    HALLUCINATION  .  Pneumococcal Vaccine Polyvalent Hives, Swelling and Other (See Comments)    REACTION: redness, swelling, and hives at injection site  . Tamoxifen Nausea And Vomiting and Other (See Comments)    HEADACHE   Follow-up Information    Follow up with Garnet Koyanagi, DO In 1 week.   Specialty:  Family Medicine   Contact information:   [address] [place] [address] [phone]        The results of significant diagnostics from this hospitalization (including imaging, microbiology, ancillary and laboratory) are listed below for reference.    Significant Diagnostic Studies: Dg Chest 2 View  01/01/2015   CLINICAL DATA:  Chest pain and shortness of breath  EXAM: CHEST  2 VIEW  COMPARISON:  05/01/2013  FINDINGS: Cardiac shadow is stable. The lungs are clear bilaterally. Biapical scarring is again seen. Old healing rib fractures are noted on the right. Apical scarring is noted bilaterally.  IMPRESSION: No acute abnormality is noted. Healing rib fractures are noted on the right.   Electronically Signed   By: Inez Catalina M.D.   On: 01/01/2015 13:15   Ct Angio Chest Pe W/cm &/or Wo Cm  01/01/2015   CLINICAL DATA:  Shortness of breath ambulating, chest pain for 2 weeks, cough  EXAM: CT ANGIOGRAPHY CHEST WITH CONTRAST  TECHNIQUE: Multidetector CT imaging of the chest was performed using the standard protocol during bolus administration of intravenous contrast. Multiplanar CT image reconstructions and MIPs were obtained to evaluate the vascular anatomy.  CONTRAST:  118mL OMNIPAQUE IOHEXOL 350 MG/ML SOLN  COMPARISON:  Radiographs from the same day and earlier  FINDINGS: The SVC is patent. RV/LV ratio normal. Satisfactory opacification of pulmonary arteries noted, and there is no evidence of pulmonary emboli. Patent superior and inferior pulmonary veins bilaterally. Adequate contrast opacification of the thoracic aorta with no evidence of dissection, aneurysm, or stenosis. There is classic  3-vessel brachiocephalic arch anatomy without proximal stenosis.  No pleural or pericardial effusion. No hilar or mediastinal adenopathy. A cluster of small nodular airspace opacities measuring 6 mm or less in the posterior right upper lobe image 32-46/6. Left lung clear. Probable bone island in T8 stable since 09/10/2007. Thoracic spine otherwise unremarkable. Sternum intact. Visualized portions of upper abdomen unremarkable.  Review of the MIP images confirms the above findings.  IMPRESSION: 1. Negative for acute PE or thoracic aortic dissection. 2. Small cluster of nodular opacities in the posterior right upper lobe, possibly inflammatory/infectious. If the patient is at high risk for bronchogenic carcinoma, follow-up chest CT at 6-12 months is recommended. If the patient is at low risk for bronchogenic carcinoma, follow-up chest CT at 12 months is recommended. This recommendation follows the consensus statement: Guidelines for Management of Small Pulmonary Nodules Detected on CT Scans: A Statement from the [place] as published in Radiology 2005;237:395-400.   Electronically Signed   By: Arne Cleveland M.D.   On: 01/01/2015 15:32    Microbiology: Recent Results (from the past 240 hour(s))  Culture, blood (routine x 2)     Status: None (Preliminary result)   Collection Time: 01/01/15  3:20 PM  Result Value Ref Range Status   Specimen Description BLOOD LEFT AC  Final   Special Requests BOTTLES DRAWN AEROBIC AND ANAEROBIC 5CC EACH  Final   Culture   Final           BLOOD CULTURE RECEIVED NO GROWTH TO DATE CULTURE WILL BE HELD FOR 5 DAYS BEFORE ISSUING A FINAL NEGATIVE REPORT Performed at [facility]    Report Status PENDING  Incomplete  Culture, blood (routine x 2)     Status: None (Preliminary result)   Collection Time: 01/01/15  4:15 PM  Result Value Ref Range Status   Specimen Description BLOOD RIGHT AC  Final   Special Requests BOTTLES DRAWN AEROBIC AND ANAEROBIC 5CC EACH   Final   Culture   Final           BLOOD CULTURE RECEIVED NO GROWTH TO DATE CULTURE WILL BE HELD FOR 5 DAYS BEFORE ISSUING A FINAL NEGATIVE REPORT Performed at [facility]    Report Status PENDING  Incomplete     Labs: Basic Metabolic Panel:  Recent Labs Lab 01/01/15 1240 01/02/15 0335 01/04/15 0010  NA 135 139 140  K 2.9* 4.2 4.5  CL 100 107 106  CO2 26 22 23   GLUCOSE 87 90 140*  BUN 19 14 16  CREATININE 1.06 1.01 1.06  CALCIUM 9.1 8.8 9.3   Liver Function Tests:  Recent Labs Lab 01/02/15 0335  AST 13  ALT 13  ALKPHOS 86  BILITOT 0.5  PROT 6.0  ALBUMIN 3.0*   No results for input(s): LIPASE, AMYLASE in the last 168 hours. No results for input(s): AMMONIA in the last 168 hours. CBC:  Recent Labs Lab 01/01/15 1240 01/02/15 0335 01/04/15 0010  WBC 17.6* 10.6* 9.4  NEUTROABS 14.2* 6.7  --   HGB 12.6 10.9* 10.9*  HCT 38.3 33.8* 34.3*  MCV 88.5 88.0 88.6  PLT 508* 364 317   Cardiac Enzymes:  Recent Labs Lab 01/01/15 1240 01/02/15 0335  TROPONINI <0.03 <0.03   BNP: BNP (last 3 results) No results for input(s): PROBNP in the last 8760 hours. CBG: No results for input(s): GLUCAP in the last 168 hours.     SignedEulogio Bear  [facility] 01/04/2015, 11:13 AM

## 2015-01-04 NOTE — Progress Notes (Signed)
UR Completed Aslyn Cottman Graves-Bigelow, RN,BSN [phone]  

## 2015-01-04 NOTE — Care Management Note (Signed)
    Page 1 of 1   01/04/2015     11:29:47 AM CARE MANAGEMENT NOTE 01/04/2015  Patient:  [name], [name]   Account Number:  [id number]  Date Initiated:  01/04/2015  Documentation initiated by:  GRAVES-BIGELOW,Levie Owensby  Subjective/Objective Assessment:   Pt admitted for cp and cough. D/c home today.     Action/Plan:   No needs identified by CM.   Anticipated DC Date:  01/04/2015   Anticipated DC Plan:  [place]  CM consult      Choice offered to / List presented to:             Status of service:  Completed, signed off Medicare Important Message given?  YES (If response is "NO", the following Medicare IM given date fields will be blank) Date Medicare IM given:  01/04/2015 Medicare IM given by:  GRAVES-BIGELOW,Gatsby Chismar Date Additional Medicare IM given:   Additional Medicare IM given by:    Discharge Disposition:  HOME/SELF CARE  Per UR Regulation:  Reviewed for med. necessity/level of care/duration of stay  If discussed at [place] of Stay Meetings, dates discussed:    Comments:

## 2015-01-04 NOTE — Progress Notes (Signed)
Discharge instructions reviewed with patient. Patient aware of change in dose of potassium and that she will need to take both lasix and potassium when she gets home today. Patient denies further questions. AVS and prescriptions given to patient. IV and telemetry previously discontinued. Transport has arrived to take patient to entrance where her husband is waiting to drive her home.

## 2015-01-05 LAB — LEGIONELLA ANTIGEN, URINE

## 2015-01-06 ENCOUNTER — Telehealth: Payer: Self-pay | Admitting: Family Medicine

## 2015-01-06 NOTE — Telephone Encounter (Signed)
Hospital follow up 01/14/15 at 11:30 with dr. Etter Sjogren, pt was treated for pneumonia.

## 2015-01-06 NOTE — Telephone Encounter (Signed)
Admit date: 01/01/2015 Discharge date: 01/04/2015  Reason for admission:  Chest pain and healthcare-associated pneumonia  Pt was also recently admitted to [facility] on 12/25/14.  She was discharged home, but continued to not feel well and therefore presented to [place] ED on 01/01/15 after being seen by Mackie Pai, PA-C.  Transition Care Management Follow-up Telephone Call  How have you been since you were released from the hospital? Pt states she is improving.  Cough is much improved with only a minimal amount of phlegm produced.  She only gets short of breath when walking upstair in her home.   She does complain of weakness, which she continues to the illness and being on bedrest in the hospital.  She was also experiencing nausea and dizziness yesterday.  Symptoms resolved today.  Lastly, she has noted 11-12 pound  weight loss within the last 2 weeks.  She does not have much of an appetite, but is forcing herself to eat and is drinking plenty of fluids.     Do you understand why you were in the hospital? yes   Do you understand the discharge instructions? yes  Items Reviewed:  Medications reviewed: yes  Allergies reviewed: yes  Dietary changes reviewed: yes, low sodium, heart healthy  Referrals reviewed: none   Functional Questionnaire:   Activities of Daily Living (ADLs):   She states they are independent in the following: ambulation, bathing and hygiene, feeding, continence, grooming, toileting and dressing States they require assistance with the following: none   Any transportation issues/concerns?: no   Any patient concerns? no   Confirmed importance and date/time of follow-up visits scheduled: yes   Confirmed with patient if condition begins to worsen call PCP or go to the ER: yes  Hospital follow up scheduled for Friday, January 08, 2015 at 11:00 am with Mackie Pai, PA-C.

## 2015-01-07 LAB — CULTURE, BLOOD (ROUTINE X 2)
CULTURE: NO GROWTH
Culture: NO GROWTH

## 2015-01-08 ENCOUNTER — Encounter: Payer: Self-pay | Admitting: Medical

## 2015-01-08 ENCOUNTER — Ambulatory Visit (HOSPITAL_BASED_OUTPATIENT_CLINIC_OR_DEPARTMENT_OTHER)
Admission: RE | Admit: 2015-01-08 | Discharge: 2015-01-08 | Disposition: A | Discharge status: Home / Self Care | Payer: Medicare Other | Source: Ambulatory Visit | Attending: Medical | Admitting: Medical

## 2015-01-08 ENCOUNTER — Ambulatory Visit (INDEPENDENT_AMBULATORY_CARE_PROVIDER_SITE_OTHER): Payer: Medicare Other | Admitting: Medical

## 2015-01-08 VITALS — BP 132/83 | HR 89 | Temp 97.2°F | Ht 64.0 in | Wt 116.2 lb

## 2015-01-08 DIAGNOSIS — R0789 Other chest pain: Secondary | ICD-10-CM

## 2015-01-08 DIAGNOSIS — Z8701 Personal history of pneumonia (recurrent): Secondary | ICD-10-CM

## 2015-01-08 DIAGNOSIS — N289 Disorder of kidney and ureter, unspecified: Secondary | ICD-10-CM

## 2015-01-08 DIAGNOSIS — R61 Generalized hyperhidrosis: Secondary | ICD-10-CM

## 2015-01-08 DIAGNOSIS — J189 Pneumonia, unspecified organism: Secondary | ICD-10-CM | POA: Diagnosis not present

## 2015-01-08 DIAGNOSIS — R918 Other nonspecific abnormal finding of lung field: Secondary | ICD-10-CM

## 2015-01-08 HISTORY — DX: Generalized hyperhidrosis: R61

## 2015-01-08 LAB — CBC WITH DIFFERENTIAL/PLATELET
BASOS ABS: 0 10*3/uL (ref 0.0–0.1)
BASOS PCT: 0.4 % (ref 0.0–3.0)
Eosinophils Absolute: 0.1 10*3/uL (ref 0.0–0.7)
Eosinophils Relative: 0.8 % (ref 0.0–5.0)
HCT: 38 % (ref 36.0–46.0)
HEMOGLOBIN: 12.2 g/dL (ref 12.0–15.0)
Lymphocytes Relative: 23.3 % (ref 12.0–46.0)
Lymphs Abs: 1.8 10*3/uL (ref 0.7–4.0)
MCHC: 32.1 g/dL (ref 30.0–36.0)
MCV: 89.2 fl (ref 78.0–100.0)
MONOS PCT: 6 % (ref 3.0–12.0)
Monocytes Absolute: 0.5 10*3/uL (ref 0.1–1.0)
Neutro Abs: 5.3 10*3/uL (ref 1.4–7.7)
Neutrophils Relative %: 69.5 % (ref 43.0–77.0)
Platelets: 312 10*3/uL (ref 150.0–400.0)
RBC: 4.26 Mil/uL (ref 3.87–5.11)
RDW: 16.9 % — AB (ref 11.5–15.5)
WBC: 7.6 10*3/uL (ref 4.0–10.5)

## 2015-01-08 LAB — COMPREHENSIVE METABOLIC PANEL
ALT: 18 U/L (ref 0–35)
AST: 23 U/L (ref 0–37)
Albumin: 4 g/dL (ref 3.5–5.2)
Alkaline Phosphatase: 102 U/L (ref 39–117)
BUN: 17 mg/dL (ref 6–23)
CO2: 28 mEq/L (ref 19–32)
Calcium: 9.6 mg/dL (ref 8.4–10.5)
Chloride: 102 mEq/L (ref 96–112)
Creatinine, Ser: 1.08 mg/dL (ref 0.40–1.20)
GFR: 53.38 mL/min — AB (ref >60.00)
Glucose, Bld: 98 mg/dL (ref 70–99)
Potassium: 5 mEq/L (ref 3.5–5.1)
Sodium: 136 mEq/L (ref 135–145)
Total Bilirubin: 0.3 mg/dL (ref 0.2–1.2)
Total Protein: 7.7 g/dL (ref 6.0–8.3)

## 2015-01-08 MED ORDER — TRAZODONE HCL 50 MG PO TABS: 50.0000 mg | ORAL_TABLET | Freq: Every evening | ORAL | Status: DC | PRN

## 2015-01-08 NOTE — Assessment & Plan Note (Signed)
I am referring you cardiology for probable stress testing due to your atypical pain that you had for some time around [place] and early January.

## 2015-01-08 NOTE — Assessment & Plan Note (Signed)
For your peristing night sweats despite the levofloxin and ct findings, I think good idea to refer you to pulmonologist.  Also asked pt to call her oncologist office to have them review the ct of chest report.

## 2015-01-08 NOTE — Progress Notes (Signed)
Subjective:    Patient ID: [name], female    DOB: 23-Mar-1946, 69 y.o.   MRN: [id number]  HPI   Pt in for follow up. I sent her down stairs to ED on the last visit due to concern over he symptoms  and ED  evaluated her. She was diagnosed with possible pneumonia. She was admitted to hospital and per pt she ws diagnosed with pneumonia. Pt was given levaquin iv per pt. And she was discharge on levaquin. Pt feels sweating at night(despite finishing course of levofloxin. But overall a lot better). Also trying to sleep despite using restoril. She used to be on trazadone. But no recommended to use while on levofloxin.  Pt only has one more tablet of the levofloxin. Has slept well for years on the trazadone.  Discharge summary showed repeat cmp, stress test due atypical chest pain which is improved. Repeat ct of chest time frame based on risk factors. Also bp.  Pt states sweats at  night for about a month.   Insominia since she was hospitalized.    Pt chest pressure stopped but still gets winded easily. Hx of smoking.  Past Medical History  Diagnosis Date  . GERD (gastroesophageal reflux disease)   . Arthritis   . Asthma   . Hypertension   . History of colonic polyps     BENIGN  . Fibromyalgia   . S/P radiation therapy 11/12/12 - 12/05/12    right Breast  . CAD (coronary artery disease) CARDIOLOGIST--  DR MCALHANY    mild non-obstructive cad  . COPD (chronic obstructive pulmonary disease)   . History of breast cancer ONCOLOGIST-- DR Jana Hakim---  NO RECURRANCE    DX 07/2012;  LOW GRADE DCIS  ER+PR+  ----  S/P RIGHT LUMPECTOMY WITH NEGATIVE MARGINS/   RADIATION ENDED 11/2012  . History of chronic bronchitis   . Pelvic pain   . Frequency of urination   . Urgency of urination   . Chronic constipation   . History of tachycardia     CONTROLLED  WITH ATENOLOL  . History of basal cell carcinoma excision     X2  . Sinus headache   . H/O hiatal hernia     History   Social History    . Marital Status: Married    Spouse Name: N/A    Number of Children: 3  . Years of Education: N/A   Occupational History  . Retired Therapist, sports    Social History Main Topics  . Smoking status: Former Smoker -- 1.00 packs/day for 15 years    Types: Cigarettes    Quit date: 05/27/2012  . Smokeless tobacco: Never Used  . Alcohol Use: No     Comment: occasional  . Drug Use: No  . Sexual Activity: Not on file   Other Topics Concern  . Not on file   Social History Narrative   Exercise-- 2days a week [employer],  [employer], walking     Past Surgical History  Procedure Laterality Date  . Nasal sinus surgery  1985  . Cardiovascular stress test  06-18-2012  DR McALHANY    LOW RISK NUCLEAR STUDY/  SMALL FIXED AREA OF MODERATELY DECREASED UPTAKE IN ANTEROSEPTAL WALL WHICH MAY BE ARTIFACTUAL/  NO ISCHEMIA/  EF 68%  . Transthoracic echocardiogram  06-24-2012    GRADE I DIASTOLIC DYSFUNCTION/  EF 55-60%/  MILD MR  . Right breast bx  08-23-2012  . Removal vocal cord cyst  FEB 2014  . Cardiac  catheterization  09-13-2007  DR Lia Foyer    WELL-PRESERVED LVF/  DIFFUSE SCATTERED CORONARY CALCIFACATION AND ATHEROSCLEROSIS WITHOUT OBSTRUCTION  . Cardiac catheterization  08-04-2010  DR [facility]    NON-OBSTRUCTIVE CAD/  pLAD 40%/  oLAD 30%/  mLAD 30%/  pRCA 30%/  EF 60%  . Colonoscopy  09-29-2010  . Breast lumpectomy Right 10-11-2012    W/ [facility] BX  . Total abdominal hysterectomy w/ bilateral salpingoophorectomy  1982    W/  APPENDECTOMY  . Tonsillectomy and adenoidectomy  AGE 60  . Orif right ankle  fx  2006  . Right hand surgery  X3  LAST ONE 2009    INCLUDES  ORIF RIGHT 5TH FINGER AND REVISION TWICE  . Cystoscopy with hydrodistension and biopsy N/A 03/06/2014    Procedure: CYSTOSCOPY/HYDRODISTENSION/ INSTILATION OF MARCAINE AND PYRIDIUM;  Surgeon: Ailene Rud, MD;  Location: [facility];  Service: Urology;  Laterality: N/A;    Family History  Problem Relation Age of Onset  .  Rectal cancer Mother   . Cancer Mother     rectal,  breast, pancreatic  . Irritable bowel syndrome Son   . Irritable bowel syndrome Daughter   . Heart disease Other   . Arthritis Other   . Cancer Other     breast cancer 1st degree relative <50, lung  . Diabetes Other     1st degree relative   . Hyperlipidemia Other   . Hypertension Other   . Osteoporosis Other   . Coronary artery disease Other 65  . Cancer Maternal Aunt     breast    Allergies  Allergen Reactions  . Clindamycin Hcl Shortness Of Breath and Rash  . Penicillins Anaphylaxis  . Prednisone Shortness Of Breath and Rash  . Codeine Hives and Other (See Comments)    headache  . Fluzone [Flu Virus Vaccine] Other (See Comments)    Local reaction at the site  . Latex Hives  . Pentazocine Lactate Other (See Comments)    HALLUCINATION  . Pneumococcal Vaccine Polyvalent Hives, Swelling and Other (See Comments)    REACTION: redness, swelling, and hives at injection site  . Tamoxifen Nausea And Vomiting and Other (See Comments)    HEADACHE    Current Outpatient Prescriptions on File Prior to Visit  Medication Sig Dispense Refill  . acetaminophen (TYLENOL) 500 MG tablet Take 1,000 mg by mouth every 6 (six) hours as needed for mild pain or headache.     . albuterol (PROVENTIL HFA;VENTOLIN HFA) 108 (90 BASE) MCG/ACT inhaler Inhale 2 puffs into the lungs every 4 (four) hours as needed. (Patient taking differently: Inhale 2 puffs into the lungs every 4 (four) hours as needed for shortness of breath. ) 1 Inhaler 5  . aspirin EC 81 MG tablet Take 1 tablet (81 mg total) by mouth daily. 90 tablet 3  . atenolol (TENORMIN) 50 MG tablet Take 1 tablet (50 mg total) by mouth every morning. 90 tablet 1  . atorvastatin (LIPITOR) 20 MG tablet Take 1 tablet (20 mg total) by mouth daily. 90 tablet 3  . DULoxetine (CYMBALTA) 60 MG capsule Take 1 capsule (60 mg total) by mouth 2 (two) times daily. 180 capsule 3  . ELMIRON 100 MG capsule Take 1  capsule by mouth 3 (three) times daily as needed. When cystitis symptoms arises.    Marland Kitchen estradiol (ESTRACE) 0.1 MG/GM vaginal cream Place 1 Applicatorful vaginally 2 (two) times a week. ALTERNATES WITH ESTRADIAL TABLET    . fluticasone (FLONASE)  50 MCG/ACT nasal spray Place 1 spray into the nose as needed for allergies.     . furosemide (LASIX) 20 MG tablet Take 1 tablet (20 mg total) by mouth every morning. 180 tablet 1  . gabapentin (NEURONTIN) 300 MG capsule 300 mg 2 (two) times daily.     Marland Kitchen imipramine (TOFRANIL) 25 MG tablet Take 1 tablet (25 mg total) by mouth at bedtime. 90 tablet 0  . Multiple Vitamin (MULTIVITAMIN) tablet Take 1 tablet by mouth daily.     Marland Kitchen omeprazole (PRILOSEC) 40 MG capsule Take 40 mg by mouth 2 (two) times daily.    . polyethylene glycol (MIRALAX / GLYCOLAX) packet Take 17 g by mouth daily. 14 each 0  . potassium chloride (MICRO-K) 10 MEQ CR capsule Take 2 capsules (20 mEq total) by mouth 2 (two) times daily. 120 capsule 1  . potassium chloride SA (K-DUR,KLOR-CON) 20 MEQ tablet Take 1 tablet (20 mEq total) by mouth daily.    . Probiotic Product (ACIDOPHILUS PROBIOTIC COMPLEX PO) Take 1 capsule by mouth daily.    . temazepam (RESTORIL) 7.5 MG capsule Take 1 capsule (7.5 mg total) by mouth at bedtime as needed for sleep. If needed while taking levaquin 5 capsule 0  . traMADol (ULTRAM) 50 MG tablet Take 50-100 mg by mouth 3 (three) times daily as needed for moderate pain.     Marland Kitchen URELLE (URELLE/URISED) 81 MG TABS tablet Take 1 tablet (81 mg total) by mouth 3 (three) times daily. (Patient taking differently: Take 1 tablet by mouth 3 (three) times daily as needed for bladder spasms. ) 30 each 2   Current Facility-Administered Medications on File Prior to Visit  Medication Dose Route Frequency Provider Last Rate Last Dose  . bupivacaine (MARCAINE) 0.5 % 10 mL, triamcinolone acetonide (KENALOG-40) 40 mg injection   Subcutaneous Once Ailene Rud, MD      . bupivacaine  (MARCAINE) 0.5 % 15 mL, phenazopyridine (PYRIDIUM) 400 mg bladder mixture   Bladder Instillation Once Ailene Rud, MD        BP 132/83 mmHg  Pulse 89  Temp(Src) 97.2 F (36.2 C) (Oral)  Ht 5\' 4"  (1.626 m)  Wt 116 lb 3.2 oz (52.708 kg)  BMI 19.94 kg/m2  SpO2 94%      Review of Systems  Constitutional: Positive for diaphoresis and fatigue. Negative for fever and chills.       Possible fever with sweats. Unclear.  HENT: Negative.   Respiratory: Positive for shortness of breath. Negative for cough, chest tightness and wheezing.        Hx of copd and former smoker. But not much per pt.  Cardiovascular: Negative for chest pain and palpitations.       Chest pain resolved since hospitalization  Gastrointestinal: Negative for abdominal pain.  Genitourinary: Negative.   Musculoskeletal: Negative for back pain.  Neurological: Negative for dizziness, seizures, syncope, facial asymmetry, speech difficulty, numbness and headaches.  Hematological: Negative for adenopathy. Does not bruise/bleed easily.  Psychiatric/Behavioral: Positive for sleep disturbance. Negative for suicidal ideas, hallucinations, behavioral problems, confusion, self-injury, dysphoric mood, decreased concentration and agitation. The patient is not nervous/anxious.        Objective:   Physical Exam   General Mental Status- Alert. General Appearance- Not in acute distress.   Skin General: Color- Normal Color. Moisture- Normal Moisture.  Neck Carotid Arteries- Normal color. Moisture- Normal Moisture. No carotid bruits. No JVD.  Chest and Lung Exam Auscultation: Breath Sounds:-Normal.  Cardiovascular Auscultation:Rythm- Regular. Murmurs &  Other Heart Sounds:Auscultation of the heart reveals- No Murmurs.  Abdomen Inspection:-Inspeection Normal. Palpation/Percussion:Note:No mass. Palpation and Percussion of the abdomen reveal- Non Tender, Non Distended + BS, no rebound or  guarding.    Neurologic Cranial Nerve exam:- CN III-XII intact(No nystagmus), symmetric smile. Romberg Exam:- Negative.  Heal to Toe Gait exam:-Normal. Strength:- 5/5 equal and symmetric strength both upper and lower extremities.   Lower ext- no pedal edema. Neg homans signs.        Assessment & Plan:

## 2015-01-08 NOTE — Progress Notes (Signed)
Pre visit review using our clinic review tool, if applicable. No additional management support is needed unless otherwise documented below in the visit note. 

## 2015-01-08 NOTE — Addendum Note (Signed)
Addended by: Anabel Halon on: 01/08/2015 01:07 PM   Modules accepted: Level of Service

## 2015-01-08 NOTE — Patient Instructions (Addendum)
I will get cmp today as follow up on hospitalization. Check on kidney function.  Your blood pressure is well controlled today.  I am referring you cardiology for probable stress testing due to your atypical pain that you had for some time around [place] and early January.  For your peristing night sweats despite the levofloxin and ct findings, I think good idea to refer you to pulmonologist.  After you finish the levofloxin. You could then use trazadone. First try 1/2 tab. Use whole tab if necessary for your insomnia.  Follow up in 3-4 wks or a needed.

## 2015-01-08 NOTE — Assessment & Plan Note (Signed)
bp well controlled.

## 2015-01-08 NOTE — Assessment & Plan Note (Signed)
I will get cmp today as follow up on hospitalization. Check on kidney function.

## 2015-01-08 NOTE — Assessment & Plan Note (Signed)
Will get cxr today to confirm/check make sure not reoccuring post hospitalization.

## 2015-01-14 ENCOUNTER — Ambulatory Visit: Payer: Medicare Other | Admitting: Family Medicine

## 2015-02-05 ENCOUNTER — Ambulatory Visit (INDEPENDENT_AMBULATORY_CARE_PROVIDER_SITE_OTHER): Payer: Medicare Other | Admitting: Family Medicine

## 2015-02-05 ENCOUNTER — Encounter: Payer: Self-pay | Admitting: Family Medicine

## 2015-02-05 ENCOUNTER — Telehealth: Payer: Self-pay | Admitting: Family Medicine

## 2015-02-05 VITALS — BP 102/82 | HR 85 | Temp 97.9°F | Resp 16 | Wt 115.5 lb

## 2015-02-05 DIAGNOSIS — K644 Residual hemorrhoidal skin tags: Secondary | ICD-10-CM

## 2015-02-05 DIAGNOSIS — N760 Acute vaginitis: Secondary | ICD-10-CM

## 2015-02-05 DIAGNOSIS — K648 Other hemorrhoids: Secondary | ICD-10-CM

## 2015-02-05 HISTORY — DX: Residual hemorrhoidal skin tags: K64.4

## 2015-02-05 MED ORDER — HYDROCORTISONE ACE-PRAMOXINE 2.5-1 % RE CREA: 1.0000 "application " | TOPICAL_CREAM | Freq: Three times a day (TID) | RECTAL | Status: DC

## 2015-02-05 MED ORDER — GABAPENTIN 300 MG PO CAPS: 300.0000 mg | ORAL_CAPSULE | Freq: Two times a day (BID) | ORAL | Status: DC

## 2015-02-05 MED ORDER — DIBUCAINE 1 % EX OINT: TOPICAL_OINTMENT | Freq: Three times a day (TID) | CUTANEOUS | Status: DC | PRN

## 2015-02-05 MED ORDER — FLUCONAZOLE 150 MG PO TABS: ORAL_TABLET | ORAL | Status: DC

## 2015-02-05 NOTE — Progress Notes (Signed)
   Subjective:    Patient ID: [name], female    DOB: 20-Jun-1946, 69 y.o.   MRN: [id number]  HPI Rectal/vaginal itching- pt has hx of hemorrhoids and treated w/ OTC preparation H.  Pt was hospitalized for PNA in January and feels she may have a yeast infxn.     Review of Systems For ROS see HPI     Objective:   Physical Exam  Constitutional: She is oriented to person, place, and time. She appears well-developed and well-nourished. No distress.  Genitourinary:  + external hemorrhoids  Neurological: She is alert and oriented to person, place, and time.  Skin: Skin is warm and dry. There is erythema (vulvar and perineal erythema consistent w/ fungal dermatitis).  Vitals reviewed.         Assessment & Plan:

## 2015-02-05 NOTE — Telephone Encounter (Signed)
ok 

## 2015-02-05 NOTE — Progress Notes (Signed)
Pre visit review using our clinic review tool, if applicable. No additional management support is needed unless otherwise documented below in the visit note. 

## 2015-02-05 NOTE — Telephone Encounter (Signed)
Patient would like to transfer from Dr. Etter Sjogren to Dr. Birdie Riddle. Is this ok? Thanks!

## 2015-02-05 NOTE — Patient Instructions (Signed)
Follow up in 3-4 weeks (30 min) to review issues Take the Diflucan as directed OTC monistat externally for the vaginal itching Use the Analpram to treat the hemorrhoid and the nupercainal for the itching/burning Call with any questions or concerns Hang in there!!

## 2015-02-05 NOTE — Telephone Encounter (Signed)
Ok w/ me 

## 2015-02-07 NOTE — Assessment & Plan Note (Signed)
New.  Pt was hospitalized for PNA last month and tx'd w/ abx.  Likely fungal dermatitis due to recent abx use.  Start oral diflucan along w/ topical antifungal cream for symptom relief.  Reviewed supportive care and red flags that should prompt return.

## 2015-02-07 NOTE — Assessment & Plan Note (Signed)
New.  Start Analpram and Nupercainal for sxs relief.  Reviewed supportive care and red flags that should prompt return.  Pt expressed understanding and is in agreement w/ plan.

## 2015-02-10 ENCOUNTER — Encounter: Payer: Self-pay | Admitting: *Deleted

## 2015-02-10 ENCOUNTER — Ambulatory Visit (INDEPENDENT_AMBULATORY_CARE_PROVIDER_SITE_OTHER): Payer: Medicare Other | Admitting: Cardiology

## 2015-02-10 ENCOUNTER — Encounter: Payer: Self-pay | Admitting: Cardiology

## 2015-02-10 VITALS — BP 122/88 | HR 92 | Ht 65.0 in | Wt 115.0 lb

## 2015-02-10 DIAGNOSIS — I10 Essential (primary) hypertension: Secondary | ICD-10-CM

## 2015-02-10 DIAGNOSIS — I2583 Coronary atherosclerosis due to lipid rich plaque: Secondary | ICD-10-CM

## 2015-02-10 DIAGNOSIS — R079 Chest pain, unspecified: Secondary | ICD-10-CM

## 2015-02-10 DIAGNOSIS — I2510 Atherosclerotic heart disease of native coronary artery without angina pectoris: Secondary | ICD-10-CM

## 2015-02-10 DIAGNOSIS — R0789 Other chest pain: Secondary | ICD-10-CM

## 2015-02-10 NOTE — Assessment & Plan Note (Signed)
Continue aspirin and statin. 

## 2015-02-10 NOTE — Assessment & Plan Note (Signed)
Plan nuclear study for risk stratification.

## 2015-02-10 NOTE — Assessment & Plan Note (Signed)
Blood pressure controlled. Continue present medications. 

## 2015-02-10 NOTE — Patient Instructions (Signed)
Your physician wants you to follow-up in: [place] will receive a reminder letter in the mail two months in advance. If you don't receive a letter, please call our office to schedule the follow-up appointment.   Your physician has requested that you have en exercise stress myoview. For further information please visit [email or web address]. Please follow instruction sheet, as given.

## 2015-02-10 NOTE — Progress Notes (Signed)
HPI: 69 year old female for evaluation of chest pain. Followed by Dr Angelena Form. Cardiac catheterization in August 2011 showed a 40% proximal LAD, 30% mid LAD, 30% RCA and her ejection fraction was 60%. Nuclear study in June 2013 showed no ischemia with an ejection fraction of 68%. Echocardiogram July 2013 showed normal LV function, rated 1 diastolic dysfunction, mild mitral regurgitation, trace aortic insufficiency and mildly elevated pulmonary pressures. CTA January 2016 showed no pulmonary embolus or dissection. There was nodule is noted in the right upper lobe. Admitted in January 2016 with atypical chest pain. Enzymes negative. Since last seen, she continues to have occasional chest pain. It is in the left breast area. It occurs with exertion and relieved with rest. It also occurs at rest. Also with dyspnea on exertion. Occasional dizziness.  Current Outpatient Prescriptions  Medication Sig Dispense Refill  . acetaminophen (TYLENOL) 500 MG tablet Take 1,000 mg by mouth every 6 (six) hours as needed for mild pain or headache.     . albuterol (PROVENTIL HFA;VENTOLIN HFA) 108 (90 BASE) MCG/ACT inhaler Inhale 2 puffs into the lungs every 4 (four) hours as needed. (Patient taking differently: Inhale 2 puffs into the lungs every 4 (four) hours as needed for shortness of breath. ) 1 Inhaler 5  . aspirin EC 81 MG tablet Take 1 tablet (81 mg total) by mouth daily. 90 tablet 3  . atenolol (TENORMIN) 50 MG tablet Take 1 tablet (50 mg total) by mouth every morning. 90 tablet 1  . atorvastatin (LIPITOR) 20 MG tablet Take 1 tablet (20 mg total) by mouth daily. 90 tablet 3  . dibucaine (NUPERCAINAL) 1 % ointment Apply topically 3 (three) times daily as needed for pain. 30 g 0  . DULoxetine (CYMBALTA) 60 MG capsule Take 1 capsule (60 mg total) by mouth 2 (two) times daily. 180 capsule 3  . ELMIRON 100 MG capsule Take 1 capsule by mouth 3 (three) times daily as needed. When cystitis symptoms arises.    Marland Kitchen  estradiol (ESTRACE) 0.1 MG/GM vaginal cream Place 1 Applicatorful vaginally 2 (two) times a week. ALTERNATES WITH ESTRADIAL TABLET    . fluconazole (DIFLUCAN) 150 MG tablet 1 tab x1 dose and repeat in 3 days 2 tablet 1  . fluticasone (FLONASE) 50 MCG/ACT nasal spray Place 1 spray into the nose as needed for allergies.     . furosemide (LASIX) 20 MG tablet Take 1 tablet (20 mg total) by mouth every morning. 180 tablet 1  . gabapentin (NEURONTIN) 300 MG capsule Take 1 capsule (300 mg total) by mouth 2 (two) times daily. 60 capsule 6  . hydrocortisone-pramoxine (ANALPRAM HC) 2.5-1 % rectal cream Place 1 application rectally 3 (three) times daily. 30 g 0  . imipramine (TOFRANIL) 25 MG tablet Take 1 tablet (25 mg total) by mouth at bedtime. 90 tablet 0  . omeprazole (PRILOSEC) 40 MG capsule Take 40 mg by mouth 2 (two) times daily.    . polyethylene glycol (MIRALAX / GLYCOLAX) packet Take 17 g by mouth daily. 14 each 0  . potassium chloride (MICRO-K) 10 MEQ CR capsule Take 2 capsules (20 mEq total) by mouth 2 (two) times daily. 120 capsule 1  . potassium chloride SA (K-DUR,KLOR-CON) 20 MEQ tablet Take 1 tablet (20 mEq total) by mouth daily.    . Probiotic Product (ACIDOPHILUS PROBIOTIC COMPLEX PO) Take 1 capsule by mouth daily.    . traMADol (ULTRAM) 50 MG tablet Take 50-100 mg by mouth 3 (three) times daily as  needed for moderate pain.     . traZODone (DESYREL) 50 MG tablet Take 1 tablet (50 mg total) by mouth at bedtime as needed for sleep. 30 tablet 1  . URELLE (URELLE/URISED) 81 MG TABS tablet Take 1 tablet (81 mg total) by mouth 3 (three) times daily. (Patient taking differently: Take 1 tablet by mouth 3 (three) times daily as needed for bladder spasms. ) 30 each 2   No current facility-administered medications for this visit.   Facility-Administered Medications Ordered in Other Visits  Medication Dose Route Frequency Provider Last Rate Last Dose  . bupivacaine (MARCAINE) 0.5 % 10 mL,  triamcinolone acetonide (KENALOG-40) 40 mg injection   Subcutaneous Once Ailene Rud, MD      . bupivacaine (MARCAINE) 0.5 % 15 mL, phenazopyridine (PYRIDIUM) 400 mg bladder mixture   Bladder Instillation Once Ailene Rud, MD        Allergies  Allergen Reactions  . Clindamycin Hcl Shortness Of Breath and Rash  . Penicillins Anaphylaxis  . Prednisone Shortness Of Breath and Rash  . Codeine Hives and Other (See Comments)    headache  . Fluzone [Flu Virus Vaccine] Other (See Comments)    Local reaction at the site  . Latex Hives  . Pentazocine Lactate Other (See Comments)    HALLUCINATION  . Pneumococcal Vaccine Polyvalent Hives, Swelling and Other (See Comments)    REACTION: redness, swelling, and hives at injection site  . Tamoxifen Nausea And Vomiting and Other (See Comments)    HEADACHE     Past Medical History  Diagnosis Date  . GERD (gastroesophageal reflux disease)   . Arthritis   . Asthma   . Hypertension   . History of colonic polyps     BENIGN  . Fibromyalgia   . S/P radiation therapy 11/12/12 - 12/05/12    right Breast  . CAD (coronary artery disease) CARDIOLOGIST--  DR MCALHANY    mild non-obstructive cad  . COPD (chronic obstructive pulmonary disease)   . History of breast cancer ONCOLOGIST-- DR Jana Hakim---  NO RECURRANCE    DX 07/2012;  LOW GRADE DCIS  ER+PR+  ----  S/P RIGHT LUMPECTOMY WITH NEGATIVE MARGINS/   RADIATION ENDED 11/2012  . History of chronic bronchitis   . Pelvic pain   . Frequency of urination   . Urgency of urination   . Chronic constipation   . History of tachycardia     CONTROLLED  WITH ATENOLOL  . History of basal cell carcinoma excision     X2  . Sinus headache   . H/O hiatal hernia     Past Surgical History  Procedure Laterality Date  . Nasal sinus surgery  1985  . Cardiovascular stress test  06-18-2012  DR McALHANY    LOW RISK NUCLEAR STUDY/  SMALL FIXED AREA OF MODERATELY DECREASED UPTAKE IN ANTEROSEPTAL WALL  WHICH MAY BE ARTIFACTUAL/  NO ISCHEMIA/  EF 68%  . Transthoracic echocardiogram  06-24-2012    GRADE I DIASTOLIC DYSFUNCTION/  EF 55-60%/  MILD MR  . Right breast bx  08-23-2012  . Removal vocal cord cyst  FEB 2014  . Cardiac catheterization  09-13-2007  DR Lia Foyer    WELL-PRESERVED LVF/  DIFFUSE SCATTERED CORONARY CALCIFACATION AND ATHEROSCLEROSIS WITHOUT OBSTRUCTION  . Cardiac catheterization  08-04-2010  DR [facility]    NON-OBSTRUCTIVE CAD/  pLAD 40%/  oLAD 30%/  mLAD 30%/  pRCA 30%/  EF 60%  . Colonoscopy  09-29-2010  . Breast lumpectomy Right 10-11-2012  W/ [facility] BX  . Total abdominal hysterectomy w/ bilateral salpingoophorectomy  1982    W/  APPENDECTOMY  . Tonsillectomy and adenoidectomy  AGE 49  . Orif right ankle  fx  2006  . Right hand surgery  X3  LAST ONE 2009    INCLUDES  ORIF RIGHT 5TH FINGER AND REVISION TWICE  . Cystoscopy with hydrodistension and biopsy N/A 03/06/2014    Procedure: CYSTOSCOPY/HYDRODISTENSION/ INSTILATION OF MARCAINE AND PYRIDIUM;  Surgeon: Ailene Rud, MD;  Location: [name];  Service: Urology;  Laterality: N/A;    History   Social History  . Marital Status: Married    Spouse Name: N/A  . Number of Children: 3  . Years of Education: N/A   Occupational History  . Retired Therapist, sports    Social History Main Topics  . Smoking status: Former Smoker -- 1.00 packs/day for 15 years    Types: Cigarettes    Quit date: 05/27/2012  . Smokeless tobacco: Never Used  . Alcohol Use: No     Comment: occasional  . Drug Use: No  . Sexual Activity: Not on file   Other Topics Concern  . Not on file   Social History Narrative   Exercise-- 2days a week [employer],  Water aerobics, walking     Family History  Problem Relation Age of Onset  . Rectal cancer Mother   . Cancer Mother     rectal,  breast, pancreatic  . Irritable bowel syndrome Son   . Irritable bowel syndrome Daughter   . Heart disease Other   . Arthritis Other   . Cancer  Other     breast cancer 1st degree relative <50, lung  . Diabetes Other     1st degree relative   . Hyperlipidemia Other   . Hypertension Other   . Osteoporosis Other   . Coronary artery disease Other 65  . Cancer Maternal Aunt     breast    ROS: fatigue but no fevers or chills, productive cough, hemoptysis, dysphasia, odynophagia, melena, hematochezia, dysuria, hematuria, rash, seizure activity, orthopnea, PND, pedal edema, claudication. Remaining systems are negative.  Physical Exam:   Blood pressure 122/88, pulse 92, height 5\' 5"  (1.651 m), weight 115 lb (52.164 kg).  General:  Well developed/well nourished in NAD Skin warm/dry Patient not depressed No peripheral clubbing Back-normal HEENT-normal/normal eyelids Neck supple/normal carotid upstroke bilaterally; no bruits; no JVD; no thyromegaly chest - CTA/ normal expansion CV - RRR/normal S1 and S2; no murmurs, rubs or gallops;  PMI nondisplaced Abdomen -NT/ND, no HSM, no mass, + bowel sounds, no bruit 2+ femoral pulses, no bruits Ext-no edema, chords, 2+ DP Neuro-grossly nonfocal  ECG 01/01/2015-sinus rhythm, inferior lateral T-wave inversion.  Sinus rhythm at a rate of 98. Inferior lateral ST depression.

## 2015-02-12 ENCOUNTER — Encounter: Payer: Self-pay | Admitting: Emergency Medicine

## 2015-02-12 ENCOUNTER — Other Ambulatory Visit (INDEPENDENT_AMBULATORY_CARE_PROVIDER_SITE_OTHER): Payer: Medicare Other

## 2015-02-12 ENCOUNTER — Ambulatory Visit (INDEPENDENT_AMBULATORY_CARE_PROVIDER_SITE_OTHER): Payer: Medicare Other | Admitting: Emergency Medicine

## 2015-02-12 VITALS — BP 100/60 | HR 88 | Ht 66.0 in | Wt 118.0 lb

## 2015-02-12 DIAGNOSIS — I2510 Atherosclerotic heart disease of native coronary artery without angina pectoris: Secondary | ICD-10-CM

## 2015-02-12 DIAGNOSIS — J4520 Mild intermittent asthma, uncomplicated: Secondary | ICD-10-CM

## 2015-02-12 DIAGNOSIS — R918 Other nonspecific abnormal finding of lung field: Secondary | ICD-10-CM

## 2015-02-12 DIAGNOSIS — R911 Solitary pulmonary nodule: Secondary | ICD-10-CM

## 2015-02-12 DIAGNOSIS — I2583 Coronary atherosclerosis due to lipid rich plaque: Principal | ICD-10-CM

## 2015-02-12 HISTORY — DX: Other nonspecific abnormal finding of lung field: R91.8

## 2015-02-12 LAB — BASIC METABOLIC PANEL
BUN: 14 mg/dL (ref 6–23)
CO2: 33 meq/L — AB (ref 19–32)
Calcium: 9.3 mg/dL (ref 8.4–10.5)
Chloride: 100 mEq/L (ref 96–112)
Creatinine, Ser: 1.19 mg/dL (ref 0.40–1.20)
GFR: 47.71 mL/min — ABNORMAL LOW (ref >60.00)
Glucose, Bld: 68 mg/dL — ABNORMAL LOW (ref 70–99)
POTASSIUM: 3.2 meq/L — AB (ref 3.5–5.1)
Sodium: 138 mEq/L (ref 135–145)

## 2015-02-12 NOTE — Patient Instructions (Addendum)
Please start using flonase every day for a month Please start loratadine 10mg  daily for next month Use your albuterol as needed. We will consider starting a scheduled inhaled medicine at some point in the future.  We will repeat your CT scan chest with contrast Follow with Dr Lamonte Sakai in 1 month

## 2015-02-12 NOTE — Assessment & Plan Note (Signed)
Poorly formed cluster of right upper lobe hazy nodular change. There is no discrete nodule. I suspect that this is inflammatory and may be related to her recent upper respiratory infection versus a colonizing organism etc.. Malignancy is possible but I feel is unlikely given the appearance of these changes - We will repeat her CT scan of the chest with contrast to compare with her prior. Depending on the results we will decide whether she needs any kind of diagnostic workup or follow-up.

## 2015-02-12 NOTE — Assessment & Plan Note (Signed)
Decompensated recently in the setting of an upper respiratory infection. He has continued cough and symptoms could be related to congestion and also bronchospasm -  We'll attempt to treat her allergies more aggressively-  She will use her albuterol as needed. We discussed the fact that she may need a scheduled every day bronchodilator some point in the future

## 2015-02-12 NOTE — Assessment & Plan Note (Signed)
She is planning for a cardiac stress test in the near future.

## 2015-02-12 NOTE — Progress Notes (Signed)
Subjective:    Patient ID: [name], female    DOB: 04-14-1946, 69 y.o.   MRN: [id number]  HPI 69 yo woman, former smoker (10-15pk-yrs), hx allergic rhinitis, HTN, GERD, depression, tachyarrhythmia, non-obstructive CAD, per cath 07/2010, s/p Hysterectomy, s/p Throat cyst removal, s/p skin cancer removal in past, admitted to [facility] ED on 06/03/12 with cc of persistent cough, worsening SOB and wheeze, dx with COPD exacerbation, but also some question of whether she may have had cardiac dz - subsequent myoview and TTE have been reassuring [name]). In retrospect she now mentions that she has always been sensitive to fumes, allergies even as a girl - never dx with asthma officially.  Exacerbating factors appear to be fumes, allergies, GERD.  ROV 09/24/12 -- follow up for SOB and cough, w hx throat cyst as above. 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 Nasonex was stopped. She also has seen [facility] regarding recurrence of her breast CA. She underwent PFT today that confirm moderately severe AFL with BD response. She has only intermittent sx, has rarely used albuterol. Her allergies, fumes, are triggers.   ROV 02/12/15 -- follow-up visit for cough and dyspnea. Her spirometry in the past has shown obstruction with a positive bronchodilator response. She has been using albuterol as needed when she flares. She had been well until late 11/2014 when she developed URI sx. She was in the hospital for this - was dealing with cough, mucous, dyspnea. She has a lot of nasal and sinus congestion, has continued to cough. She underwent CT-PA on 12/30/14 that showed RUL nodular opacities. She is having exertional SOB, no wheeze. She is scheduled to undergo a cardiac stress test soon. She is using flonase,    PULMONARY FUNCTON TEST 09/24/2012  FVC 2.45  FEV1 1.31  FEV1/FVC 53.5  FVC  % Predicted 81  FEV % Predicted 60  FeF 25-75 .48  FeF 25-75 % Predicted 2.44       Objective:   Physical Exam Filed Vitals:   02/12/15 1446  BP: 100/60  Pulse: 88   Gen: Pleasant, well-nourished, in no distress,  normal affect  ENT: No lesions,  mouth clear,  oropharynx clear, no postnasal drip  Neck: No JVD, no TMG, no carotid bruits  Lungs: No use of accessory muscles, no dullness to percussion, clear without rales or rhonchi  Cardiovascular: RRR, heart sounds normal, no murmur or gallops, no peripheral edema  Musculoskeletal: No deformities, no cyanosis or clubbing  Neuro: alert, non focal  Skin: Warm, no lesions or rashes     Assessment & Plan:  CAD (coronary artery disease) She is planning for a cardiac stress test in the near future.    Asthma, allergic Decompensated recently in the setting of an upper respiratory infection. He has continued cough and symptoms could be related to congestion and also bronchospasm -  We'll attempt to treat her allergies more aggressively-  She will use her albuterol as needed. We discussed the fact that she may need a scheduled every day bronchodilator some point in the future   Pulmonary nodules Poorly formed cluster of right upper lobe hazy nodular change. There is no discrete nodule. I suspect that this is inflammatory and may be related to her recent upper respiratory infection versus a colonizing organism etc.. Malignancy is possible but I feel is unlikely given the appearance of these changes - We will repeat her CT scan of the chest with contrast to compare  with her prior. Depending on the results we will decide whether she needs any kind of diagnostic workup or follow-up.

## 2015-02-19 ENCOUNTER — Telehealth (HOSPITAL_COMMUNITY): Payer: Self-pay

## 2015-02-19 NOTE — Telephone Encounter (Signed)
Encounter complete. 

## 2015-02-22 ENCOUNTER — Telehealth: Payer: Self-pay

## 2015-02-22 MED ORDER — TRAZODONE HCL 100 MG PO TABS
100.0000 mg | ORAL_TABLET | Freq: Every day | ORAL | Status: DC
Start: 2015-02-22 — End: 2019-07-24

## 2015-02-22 NOTE — Telephone Encounter (Signed)
Ok for 100mg  2 tabs QHS, #180, 1 refill

## 2015-02-22 NOTE — Telephone Encounter (Signed)
Trazodone 100 mg 2 tablets at bedtime  Last filled 08/24/13 #180 with 1 refill Rx sent for 50 mg  1 po qhs  On 01/08/15 #30 with 1 by edward,  Please advise       KP

## 2015-02-22 NOTE — Telephone Encounter (Signed)
Med filled.  

## 2015-02-23 ENCOUNTER — Ambulatory Visit (INDEPENDENT_AMBULATORY_CARE_PROVIDER_SITE_OTHER)
Admission: RE | Admit: 2015-02-23 | Discharge: 2015-02-23 | Disposition: A | Discharge status: Home / Self Care | Payer: Medicare Other | Source: Ambulatory Visit | Attending: Emergency Medicine | Admitting: Emergency Medicine

## 2015-02-23 DIAGNOSIS — R911 Solitary pulmonary nodule: Secondary | ICD-10-CM

## 2015-02-23 MED ORDER — IOHEXOL 300 MG/ML  SOLN
80.0000 mL | Freq: Once | INTRAMUSCULAR | Status: AC | PRN
  Administered 2015-02-23: 70 mL via INTRAVENOUS

## 2015-02-24 ENCOUNTER — Ambulatory Visit (HOSPITAL_COMMUNITY)
Admission: RE | Admit: 2015-02-24 | Discharge: 2015-02-24 | Disposition: A | Discharge status: Home / Self Care | Payer: Medicare Other | Source: Ambulatory Visit | Attending: Cardiology | Admitting: Cardiology

## 2015-02-24 DIAGNOSIS — I10 Essential (primary) hypertension: Secondary | ICD-10-CM | POA: Diagnosis not present

## 2015-02-24 DIAGNOSIS — R0609 Other forms of dyspnea: Secondary | ICD-10-CM | POA: Diagnosis not present

## 2015-02-24 DIAGNOSIS — E785 Hyperlipidemia, unspecified: Secondary | ICD-10-CM | POA: Insufficient documentation

## 2015-02-24 DIAGNOSIS — Z87891 Personal history of nicotine dependence: Secondary | ICD-10-CM | POA: Insufficient documentation

## 2015-02-24 DIAGNOSIS — R9431 Abnormal electrocardiogram [ECG] [EKG]: Secondary | ICD-10-CM | POA: Insufficient documentation

## 2015-02-24 DIAGNOSIS — Z8249 Family history of ischemic heart disease and other diseases of the circulatory system: Secondary | ICD-10-CM | POA: Insufficient documentation

## 2015-02-24 DIAGNOSIS — R079 Chest pain, unspecified: Secondary | ICD-10-CM

## 2015-02-24 DIAGNOSIS — R42 Dizziness and giddiness: Secondary | ICD-10-CM | POA: Diagnosis not present

## 2015-02-24 MED ORDER — TECHNETIUM TC 99M SESTAMIBI GENERIC - CARDIOLITE
31.3000 | Freq: Once | INTRAVENOUS | Status: AC | PRN
  Administered 2015-02-24: 31.3 via INTRAVENOUS

## 2015-02-24 MED ORDER — TECHNETIUM TC 99M SESTAMIBI GENERIC - CARDIOLITE
10.8000 | Freq: Once | INTRAVENOUS | Status: AC | PRN
Start: 2015-02-24 — End: 2015-02-24
  Administered 2015-02-24: 11 via INTRAVENOUS

## 2015-02-24 NOTE — Procedures (Addendum)
[facility] [address] [place] [address] [phone]  Cardiology Nuclear Med Study  [name] is a 69 y.o. female     MRN : [id number]     DOB: 11-Nov-1946  Procedure Date: 02/24/2015  Nuclear Med Background Indication for Stress Test:  Evaluation for Ischemia, [facility] and Abnormal EKG History:  Asthma, COPD, MVP and CAD;Last NUC MPI on 06/18/2012-normal;EF=68%;Renal Inuf.;Pulmonary Nodules;Hx breast CA and cervical CA; Cardiac Risk Factors: Carotid Disease, Family History - CAD, History of Smoking, Hypertension and Lipids  Symptoms:  Chest Pain, Diaphoresis, Dizziness, DOE, Fatigue and Light-Headedness   Nuclear Pre-Procedure Caffeine/Decaff Intake:  1:00am NPO After: 9:00am   IV Site: R Forearm  IV 0.9% NS with Angio Cath:  22g  Chest Size (in):  n/a IV Started by: Rolene Course, RN  Height: 5\' 6"  (1.676 m)  Cup Size: DD  BMI:  Body mass index is 19.05 kg/(m^2). Weight:  118 lb (53.524 kg)   Tech Comments:  n/a    Nuclear Med Study 1 or 2 day study: 1 day  Stress Test Type:  Stress  Order Authorizing Provider:  Kirk Ruths, MD   Resting Radionuclide: Technetium 2m Sestamibi  Resting Radionuclide Dose: 10.8 mCi   Stress Radionuclide:  Technetium 63m Sestamibi  Stress Radionuclide Dose: 31.3 mCi           Stress Protocol Rest HR: 75 Stress HR: 125  Rest BP: 149/91 Stress BP: 138/76  Exercise Time (min): 5:32 METS: 7.0   Predicted Max HR: 151 bpm % Max HR: 82.78 bpm Rate Pressure Product: 21250  Dose of Adenosine (mg):  n/a Dose of Lexiscan: n/a mg  Dose of Atropine (mg): n/a Dose of Dobutamine: n/a mcg/kg/min (at max HR)  Stress Test Technologist: Leane Para, CCT Nuclear Technologist: Imagene Riches, CNMT   Rest Procedure:  Myocardial perfusion imaging was performed at rest 45 minutes following the intravenous administration of Technetium 4m Sestamibi. Stress Procedure:  The patient  performed treadmill exercise using a Bruce  Protocol for 5:32 minutes. The patient stopped due to SOB, Fatigue and Dizziness and denied any chest pain.  There were no significant ST-T wave changes.  Technetium 79m Sestamibi was injected IV at peak exercise and myocardial perfusion imaging was performed after a brief delay.  Transient Ischemic Dilatation (Normal <1.22):  0.98 QGS EDV:  52 ml QGS ESV:  17 ml LV Ejection Fraction: 67%  Rest ECG: NSR - Normal EKG  Stress ECG: No significant change from baseline ECG  QPS Raw Data Images:  Normal; no motion artifact; normal heart/lung ratio. Stress Images:  There is decreased uptake in the anterior wall. Rest Images:  There is decreased uptake in the apex. Subtraction (SDS):  There is a fixed anteriour defect that is most consistent with breast attenuation. SDS 0.  Impression Exercise Capacity:  Fair exercise capacity. BP Response:  Normal blood pressure response. Clinical Symptoms:  No significant symptoms noted. ECG Impression:  No significant ST segment change suggestive of ischemia. Comparison with Prior Nuclear Study: No significant change from previous study  Overall Impression:  Low risk stress nuclear study with small area of anterior breast attenuation artifact. No reversible ischemia.  LV Wall Motion:  NL LV Function; NL Wall Motion: LVEF 67%  Pixie Casino, MD, [facility] Board Certified in Nuclear Cardiology Attending Cardiologist [place], MD  02/24/2015 7:21 PM

## 2015-03-01 ENCOUNTER — Ambulatory Visit: Payer: Medicare Other | Admitting: Family Medicine

## 2015-03-01 ENCOUNTER — Encounter: Payer: Self-pay | Admitting: Family Medicine

## 2015-03-01 ENCOUNTER — Telehealth: Payer: Self-pay | Admitting: Family Medicine

## 2015-03-01 ENCOUNTER — Ambulatory Visit (INDEPENDENT_AMBULATORY_CARE_PROVIDER_SITE_OTHER): Payer: Medicare Other | Admitting: Family Medicine

## 2015-03-01 VITALS — BP 118/80 | HR 72 | Temp 98.1°F | Resp 16 | Wt 120.4 lb

## 2015-03-01 DIAGNOSIS — I25118 Atherosclerotic heart disease of native coronary artery with other forms of angina pectoris: Secondary | ICD-10-CM

## 2015-03-01 DIAGNOSIS — R208 Other disturbances of skin sensation: Secondary | ICD-10-CM

## 2015-03-01 DIAGNOSIS — E785 Hyperlipidemia, unspecified: Secondary | ICD-10-CM

## 2015-03-01 DIAGNOSIS — R200 Anesthesia of skin: Secondary | ICD-10-CM

## 2015-03-01 DIAGNOSIS — R918 Other nonspecific abnormal finding of lung field: Secondary | ICD-10-CM

## 2015-03-01 DIAGNOSIS — M2550 Pain in unspecified joint: Secondary | ICD-10-CM

## 2015-03-01 LAB — BASIC METABOLIC PANEL
BUN: 15 mg/dL (ref 6–23)
CO2: 31 meq/L (ref 19–32)
Calcium: 9.3 mg/dL (ref 8.4–10.5)
Chloride: 102 mEq/L (ref 96–112)
Creatinine, Ser: 1.15 mg/dL (ref 0.40–1.20)
GFR: 49.63 mL/min — ABNORMAL LOW (ref >60.00)
Glucose, Bld: 82 mg/dL (ref 70–99)
POTASSIUM: 4.1 meq/L (ref 3.5–5.1)
SODIUM: 136 meq/L (ref 135–145)

## 2015-03-01 LAB — CBC WITH DIFFERENTIAL/PLATELET
BASOS ABS: 0 10*3/uL (ref 0.0–0.1)
Basophils Relative: 0.8 % (ref 0.0–3.0)
EOS ABS: 0.2 10*3/uL (ref 0.0–0.7)
Eosinophils Relative: 4.5 % (ref 0.0–5.0)
HCT: 34.7 % — ABNORMAL LOW (ref 36.0–46.0)
Hemoglobin: 11.3 g/dL — ABNORMAL LOW (ref 12.0–15.0)
LYMPHS PCT: 23.4 % (ref 12.0–46.0)
Lymphs Abs: 1.2 10*3/uL (ref 0.7–4.0)
MCHC: 32.7 g/dL (ref 30.0–36.0)
MCV: 87.5 fl (ref 78.0–100.0)
Monocytes Absolute: 0.5 10*3/uL (ref 0.1–1.0)
Monocytes Relative: 9 % (ref 3.0–12.0)
NEUTROS PCT: 62.3 % (ref 43.0–77.0)
Neutro Abs: 3.3 10*3/uL (ref 1.4–7.7)
PLATELETS: 253 10*3/uL (ref 150.0–400.0)
RBC: 3.96 Mil/uL (ref 3.87–5.11)
RDW: 16.6 % — AB (ref 11.5–15.5)
WBC: 5.3 10*3/uL (ref 4.0–10.5)

## 2015-03-01 LAB — HEMOGLOBIN A1C: Hgb A1c MFr Bld: 5.8 % (ref 4.6–6.5)

## 2015-03-01 LAB — HEPATIC FUNCTION PANEL
ALBUMIN: 4.1 g/dL (ref 3.5–5.2)
ALT: 15 U/L (ref 0–35)
AST: 24 U/L (ref 0–37)
Alkaline Phosphatase: 85 U/L (ref 39–117)
Bilirubin, Direct: 0.1 mg/dL (ref 0.0–0.3)
Total Bilirubin: 0.3 mg/dL (ref 0.2–1.2)
Total Protein: 7.2 g/dL (ref 6.0–8.3)

## 2015-03-01 LAB — LIPID PANEL
CHOL/HDL RATIO: 4
Cholesterol: 173 mg/dL (ref 0–200)
HDL: 42.6 mg/dL (ref >39.00)
LDL Cholesterol: 100 mg/dL — ABNORMAL HIGH (ref 0–99)
NonHDL: 130.4
Triglycerides: 153 mg/dL — ABNORMAL HIGH (ref 0.0–149.0)
VLDL: 30.6 mg/dL (ref 0.0–40.0)

## 2015-03-01 LAB — B12 AND FOLATE PANEL
Folate: 23.3 ng/mL (ref >5.9)
Vitamin B-12: 505 pg/mL (ref 211–911)

## 2015-03-01 LAB — SEDIMENTATION RATE: SED RATE: 30 mm/h — AB (ref 0–22)

## 2015-03-01 LAB — RHEUMATOID FACTOR

## 2015-03-01 LAB — TSH: TSH: 2.07 u[IU]/mL (ref 0.35–4.50)

## 2015-03-01 MED ORDER — FLUCONAZOLE 150 MG PO TABS: 150.0000 mg | ORAL_TABLET | Freq: Once | ORAL | Status: DC

## 2015-03-01 NOTE — Telephone Encounter (Signed)
She was in a few weeks ago and had a vaginal  Yeast infection.  Forgot to mention it today and needs another round of the meds.  Please send to [facility] [address] [facility]

## 2015-03-01 NOTE — Telephone Encounter (Signed)
Diflucan sent to pharmacy.

## 2015-03-01 NOTE — Patient Instructions (Signed)
We'll notify you of your lab results and determine the next steps We'll call you with your neuro appt to assess your hand numbness We will work through things as they arise while managing the ongoing issues Call with any questions or concerns Hang in there!  We'll figure this out!

## 2015-03-01 NOTE — Progress Notes (Signed)
Pre visit review using our clinic review tool, if applicable. No additional management support is needed unless otherwise documented below in the visit note. 

## 2015-03-01 NOTE — Progress Notes (Signed)
   Subjective:    Patient ID: [name], female    DOB: 01/04/1946, 69 y.o.   MRN: [id number]  HPI Transfer of care- pt has multiple active issues:  Pulmonary nodule- pt is seeing Dr Lamonte Sakai, just had CT scan done that showed stable 5 mm RUL nodule.  Plan is to repeat CT scan in 6 months.  Nuclear stress test- low risk study, normal EF.  Ordered by Dr Stanford Breed to evaluate CP and abnormal EKG.  Hyperlipidemia- chronic problem.  Pt was switched from Crestor to Lipitor this fall due to cost.  Pt is due for repeat labs to assess level of control on current medication.  Bilateral hand numbness- occurs at night.  Will need to shake hands repeatedly to 'get them to wake up'.  Polyarthralgia- pt reports pain in bilateral hips, knees, shoulders.  Having difficulty sleeping due to pain.  Reports she is having difficulty getting up and down stairs due to hip pain and then feels she is injuring her knees by compensating.  Has seen Dr Charlestine Night previously but that was years ago.`     Review of Systems For ROS see HPI     Objective:   Physical Exam  Constitutional: She is oriented to person, place, and time. She appears well-developed and well-nourished. No distress.  HENT:  Head: Normocephalic and atraumatic.  Eyes: Conjunctivae and EOM are normal. Pupils are equal, round, and reactive to light.  Neck: Normal range of motion. Neck supple. No thyromegaly present.  Cardiovascular: Normal rate, regular rhythm, normal heart sounds and intact distal pulses.   No murmur heard. Pulmonary/Chest: Effort normal and breath sounds normal. No respiratory distress.  Abdominal: Soft. She exhibits no distension. There is no tenderness.  Musculoskeletal: She exhibits no edema.  Lymphadenopathy:    She has no cervical adenopathy.  Neurological: She is alert and oriented to person, place, and time. She has normal reflexes. No cranial nerve deficit. Coordination normal.  Skin: Skin is warm and dry.  Psychiatric:  She has a normal mood and affect. Her behavior is normal.  Vitals reviewed.         Assessment & Plan:

## 2015-03-02 LAB — ANA: Anti Nuclear Antibody(ANA): POSITIVE — AB

## 2015-03-02 LAB — ANTI-NUCLEAR AB-TITER (ANA TITER): ANA Titer 1: NEGATIVE

## 2015-03-04 ENCOUNTER — Other Ambulatory Visit: Payer: Self-pay

## 2015-03-04 DIAGNOSIS — G5603 Carpal tunnel syndrome, bilateral upper limbs: Secondary | ICD-10-CM | POA: Insufficient documentation

## 2015-03-04 HISTORY — DX: Carpal tunnel syndrome, bilateral upper limbs: G56.03

## 2015-03-04 MED ORDER — LORATADINE 10 MG PO TABS: 10.0000 mg | ORAL_TABLET | Freq: Every day | ORAL | Status: DC

## 2015-03-04 MED ORDER — ALBUTEROL SULFATE HFA 108 (90 BASE) MCG/ACT IN AERS: 2.0000 | INHALATION_SPRAY | RESPIRATORY_TRACT | Status: DC | PRN

## 2015-03-04 NOTE — Progress Notes (Signed)
Quick Note:  Pt aware of results/recs. ______

## 2015-03-07 NOTE — Assessment & Plan Note (Signed)
Chronic problem.  Pt was switched to Lipitor due to cost.  Due for repeat labs to see if medication is as effective as Crestor.  Check labs.  Adjust meds prn

## 2015-03-07 NOTE — Assessment & Plan Note (Signed)
New to provider, ongoing for pt but she reports this is occuring more frequently.  Offered neuro referral for nerve conduction study.  Pt reports she sees Neuro in [place].  Will discuss this w/ him first and then decide how she wants to proceed.  Will follow.

## 2015-03-07 NOTE — Assessment & Plan Note (Signed)
Ongoing problem for pt.  Has seen Dr Charlestine Night ([facility]) 'years ago' but has not had recent labs in setting of worsening pain.  Check labs and refer to rheum as needed.  Pt expressed understanding and is in agreement w/ plan.

## 2015-03-07 NOTE — Assessment & Plan Note (Signed)
Chronic problem.  Pt just had low risk stress test w/ Dr Stanford Breed.  Goal is risk reduction.  Will follow.

## 2015-03-07 NOTE — Assessment & Plan Note (Signed)
Chronic problem, following w/ Dr Lamonte Sakai.  Plan is for future imaging to assess stability.

## 2015-04-02 ENCOUNTER — Ambulatory Visit (INDEPENDENT_AMBULATORY_CARE_PROVIDER_SITE_OTHER): Payer: Medicare Other | Admitting: Emergency Medicine

## 2015-04-02 ENCOUNTER — Encounter: Payer: Self-pay | Admitting: Emergency Medicine

## 2015-04-02 VITALS — BP 102/62 | HR 72 | Ht 65.0 in | Wt 119.0 lb

## 2015-04-02 DIAGNOSIS — I2510 Atherosclerotic heart disease of native coronary artery without angina pectoris: Secondary | ICD-10-CM | POA: Diagnosis not present

## 2015-04-02 DIAGNOSIS — R918 Other nonspecific abnormal finding of lung field: Secondary | ICD-10-CM

## 2015-04-02 DIAGNOSIS — J4520 Mild intermittent asthma, uncomplicated: Secondary | ICD-10-CM | POA: Diagnosis not present

## 2015-04-02 DIAGNOSIS — I2583 Coronary atherosclerosis due to lipid rich plaque: Principal | ICD-10-CM

## 2015-04-02 NOTE — Assessment & Plan Note (Signed)
We will continue her Zyrtec as a nasal spray and her loratadine. We also discussed potentially starting nasal saline washes if her allergies flare. Controlling her allergy symptoms has greatly improved her asthma control. She's currently using albuterol as needed and has to use this rarely. Continue this regimen

## 2015-04-02 NOTE — Assessment & Plan Note (Signed)
Right upper lobe pulmonary nodule is stable in appearance. I would like to repeat her scan in 6 months. She does not have a heavy tobacco history but she does have a history of breast cancer.

## 2015-04-02 NOTE — Progress Notes (Signed)
Subjective:    Patient ID: [name], female    DOB: [birth year], 69 y.o.   MRN: [id number]  HPI 69 yo woman, former smoker (10-15pk-yrs), hx allergic rhinitis, HTN, GERD, depression, tachyarrhythmia, non-obstructive CAD, per cath 07/2010, s/p Hysterectomy, s/p Throat cyst removal, s/p skin cancer removal in past, admitted to [facility] ED on 06/03/12 with cc of persistent cough, worsening SOB and wheeze, dx with COPD exacerbation, but also some question of whether she may have had cardiac dz - subsequent myoview and TTE have been reassuring [name]). In retrospect she now mentions that she has always been sensitive to fumes, allergies even as a girl - never dx with asthma officially.  Exacerbating factors appear to be fumes, allergies, GERD.  ROV 09/24/12 -- follow up for SOB and cough, w hx throat cyst as above. 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 Nasonex was stopped. She also has seen [facility] regarding recurrence of her breast CA. She underwent PFT today that confirm moderately severe AFL with BD response. She has only intermittent sx, has rarely used albuterol. Her allergies, fumes, are triggers.   ROV 02/12/15 -- follow-up visit for cough and dyspnea. Her spirometry in the past has shown obstruction with a positive bronchodilator response. She has been using albuterol as needed when she flares. She had been well until late 11/2014 when she developed URI sx. She was in the hospital for this - was dealing with cough, mucous, dyspnea. She has a lot of nasal and sinus congestion, has continued to cough. She underwent CT-PA on 12/30/14 that showed RUL nodular opacities. She is having exertional SOB, no wheeze. She is scheduled to undergo a cardiac stress test soon. She is using flonase,   ROV follow-up visit for obstructive lung disease with a positive bronchodilator response. She also has a history of pulmonary nodule that we've been following last CT scan. Her most recent was  done in March 2016 and shows that the 5 mm right upper lobe nodule is stable. She is currently on . She tells me that her breathing is much better. She has been using loratadine and flonase. She has not needed her SABA prn. She is not on a scheduled BD. Cardiac stress testing > low risk test.    PULMONARY FUNCTON TEST 09/24/2012  FVC 2.45  FEV1 1.31  FEV1/FVC 53.5  FVC  % Predicted 81  FEV % Predicted 60  FeF 25-75 .48  FeF 25-75 % Predicted 2.44       Objective:   Physical Exam Filed Vitals:   04/02/15 1426  BP: 102/62  Pulse: 72   Gen: Pleasant, well-nourished, in no distress,  normal affect  ENT: No lesions,  mouth clear,  oropharynx clear, no postnasal drip  Neck: No JVD, no TMG, no carotid bruits  Lungs: No use of accessory muscles, no dullness to percussion, clear without rales or rhonchi  Cardiovascular: RRR, heart sounds normal, no murmur or gallops, no peripheral edema  Musculoskeletal: No deformities, no cyanosis or clubbing  Neuro: alert, non focal  Skin: Warm, no lesions or rashes  02/23/15 --  Reviewed by me personally COMPARISON: 01/01/2015  FINDINGS: Mediastinum: Normal heart size. There is no pericardial effusion identified. Calcified atherosclerotic disease noted involving the LAD Coronary artery. The trachea is patent and appears midline. There is a normal appearance of the esophagus. No mediastinal or hilar lymph nodes identified.  Lungs/Pleura: No pleural effusion noted. 5 mm right upper lobe nodule  is unchanged from previous exam, image 17 of series 3. Within the basilar portion of a right middle lobe there is a stable scar like opacity, image 30/series 3. No new pulmonary nodules identified.  Upper Abdomen: Right hepatic lobe hemangioma is again noted, image 62/series 2. The visualized portions of the adrenal glands and spleen are normal.  Musculoskeletal: There are no aggressive lytic or sclerotic bone lesions identified. There is a  mild scoliosis deformity and multi level degenerative disc disease.  IMPRESSION: 1. No acute cardiopulmonary abnormalities. 2. Stable 5 mm right upper lobe nodule. If this patient is at increased risk for lung cancer then the next followup exam should be obtained If the patient is at high risk for bronchogenic carcinoma, follow-up chest CT at 6-12 months is recommended. If the patient is at low risk for bronchogenic carcinoma, follow-up chest CT at 12 months is recommended. This recommendation follows the consensus statement: Guidelines for Management of Small Pulmonary Nodules Detected on CT Scans: A Statement from the Fleischner Society as published in Radiology 2005;237:395-400       Assessment & Plan:  CAD (coronary artery disease) Recent reassuring cardiac stress test   Asthma, allergic We will continue her Zyrtec as a nasal spray and her loratadine. We also discussed potentially starting nasal saline washes if her allergies flare. Controlling her allergy symptoms has greatly improved her asthma control. She's currently using albuterol as needed and has to use this rarely. Continue this regimen   Pulmonary nodules Right upper lobe pulmonary nodule is stable in appearance. I would like to repeat her scan in 6 months. She does not have a heavy tobacco history but she does have a history of breast cancer.

## 2015-04-02 NOTE — Assessment & Plan Note (Signed)
Recent reassuring cardiac stress test

## 2015-04-02 NOTE — Patient Instructions (Signed)
We will continue your albuterol 2 puffs as needed Please continue your loratadine daily and your nasal steroid spray as you are taking it Consider using nasal saline washes if your allergies flare  We will repeat your CT scan of the chest in September 2016 to compare.  Follow with Dr Lamonte Sakai in September to review the Ct scan, or sooner if you have problems.

## 2015-04-20 ENCOUNTER — Other Ambulatory Visit: Payer: Medicare Other

## 2015-04-20 ENCOUNTER — Other Ambulatory Visit (HOSPITAL_BASED_OUTPATIENT_CLINIC_OR_DEPARTMENT_OTHER): Payer: Medicare Other

## 2015-04-20 DIAGNOSIS — C50911 Malignant neoplasm of unspecified site of right female breast: Secondary | ICD-10-CM

## 2015-04-20 DIAGNOSIS — D0511 Intraductal carcinoma in situ of right breast: Secondary | ICD-10-CM

## 2015-04-20 LAB — COMPREHENSIVE METABOLIC PANEL (CC13)
ALBUMIN: 3.7 g/dL (ref 3.5–5.0)
ALK PHOS: 87 U/L (ref 40–150)
ALT: 16 U/L (ref 0–55)
AST: 23 U/L (ref 5–34)
Anion Gap: 12 mEq/L — ABNORMAL HIGH (ref 3–11)
BILIRUBIN TOTAL: 0.26 mg/dL (ref 0.20–1.20)
BUN: 13.1 mg/dL (ref 7.0–26.0)
CO2: 26 mEq/L (ref 22–29)
CREATININE: 1.1 mg/dL (ref 0.6–1.1)
Calcium: 8.9 mg/dL (ref 8.4–10.4)
Chloride: 102 mEq/L (ref 98–109)
EGFR: 49 mL/min/{1.73_m2} — AB (ref >90)
GLUCOSE: 82 mg/dL (ref 70–140)
POTASSIUM: 3.9 meq/L (ref 3.5–5.1)
Sodium: 140 mEq/L (ref 136–145)
Total Protein: 6.8 g/dL (ref 6.4–8.3)

## 2015-04-20 LAB — CBC WITH DIFFERENTIAL/PLATELET
BASO%: 0.8 % (ref 0.0–2.0)
Basophils Absolute: 0 10*3/uL (ref 0.0–0.1)
EOS%: 3.2 % (ref 0.0–7.0)
Eosinophils Absolute: 0.2 10*3/uL (ref 0.0–0.5)
HCT: 36 % (ref 34.8–46.6)
HGB: 11.3 g/dL — ABNORMAL LOW (ref 11.6–15.9)
LYMPH#: 1.3 10*3/uL (ref 0.9–3.3)
LYMPH%: 26.8 % (ref 14.0–49.7)
MCH: 28.5 pg (ref 25.1–34.0)
MCHC: 31.4 g/dL — ABNORMAL LOW (ref 31.5–36.0)
MCV: 90.9 fL (ref 79.5–101.0)
MONO#: 0.5 10*3/uL (ref 0.1–0.9)
MONO%: 9 % (ref 0.0–14.0)
NEUT#: 3 10*3/uL (ref 1.5–6.5)
NEUT%: 60.2 % (ref 38.4–76.8)
PLATELETS: 221 10*3/uL (ref 145–400)
RBC: 3.96 10*6/uL (ref 3.70–5.45)
RDW: 15.9 % — AB (ref 11.2–14.5)
WBC: 5 10*3/uL (ref 3.9–10.3)

## 2015-04-27 ENCOUNTER — Ambulatory Visit (HOSPITAL_BASED_OUTPATIENT_CLINIC_OR_DEPARTMENT_OTHER): Payer: Medicare Other | Admitting: Oncology

## 2015-04-27 VITALS — BP 124/62 | HR 78 | Temp 97.4°F | Resp 18 | Ht 65.0 in | Wt 119.5 lb

## 2015-04-27 DIAGNOSIS — C50911 Malignant neoplasm of unspecified site of right female breast: Secondary | ICD-10-CM

## 2015-04-27 DIAGNOSIS — D0511 Intraductal carcinoma in situ of right breast: Secondary | ICD-10-CM

## 2015-04-27 DIAGNOSIS — Z170 Estrogen receptor positive status [ER+]: Secondary | ICD-10-CM

## 2015-04-27 DIAGNOSIS — I2510 Atherosclerotic heart disease of native coronary artery without angina pectoris: Secondary | ICD-10-CM

## 2015-04-27 NOTE — Progress Notes (Signed)
ID: [name]   DOB: Nov 19, 1946  MR#: [id number]  MGQ#:[id number]  PCP: Annye Asa, MD GYN:  SU: Alphonsa Overall,  OTHER MD: Eppie Gibson, Baltazar Apo, Erskine Emery, Jacques Navy, Madelon Lips Wright,   HISTORY OF PRESENT ILLNESS: From the original intake note:  Randilyn noted a minimal discharge from the right breast early and 2013. She did not think much of this until it became more copious him a and then she brought it to Dr. Ashley Murrain attention and bilateral breast MRI 07/29/2012 showed 2 areas of concern in the right breast. Right breast biopsy x3 was obtained 08/23/2012, and showed ([id number]) atypical ductal hyperplasia partially involving a papillary lesion. Accordingly, on 10/11/2012 the patient underwent right lumpectomy for what proved to be ([id number] ductal carcinoma in situ, low-grade, measuring approximately 3.2 cm, with negative margins (0.15 cm), 100% estrogen receptor positive, 61% progesterone receptor positive.  With his results the patient was referred to radiation oncology and she is currently receiving of radiation treatments under Dr. Isidore Moos, scheduled to be completed mid December 2013.  The patient's subsequent history is as detailed below.  INTERVAL HISTORY: Itsel returns today for followup of her noninvasive breast cancer.  She had an episode of pneumonia in December 2015 and after that she came off statins. She says "I have never felt this good in years". She is going to the gym 2 or 3 times a week, mostly doing walking. She was the plan to garden. Her husband has noticed a much better she has been feeling.  REVIEW OF SYSTEMS:  she continues to have problems with sleep. She has pain in her hips and right knee , which is not new. She has cataract problems. Just sinus issues including ringing in her ears, sinus problems, sore throat, and hoarseness. She denies a cough. She developed a right submandibular lymph node possibly related to her sinus problems. She has  heartburn and a hiatal hernia. She has had a couple of "skin cancers" removed by Dr. Allyson Sabal. She has headaches occasionally. These are better. A detailed review of systems today was otherwise stable  PAST MEDICAL HISTORY: Past Medical History  Diagnosis Date  . GERD (gastroesophageal reflux disease)   . Arthritis   . Asthma   . Hypertension   . History of colonic polyps     BENIGN  . Fibromyalgia   . S/P radiation therapy 11/12/12 - 12/05/12    right Breast  . CAD (coronary artery disease) CARDIOLOGIST--  DR MCALHANY    mild non-obstructive cad  . COPD (chronic obstructive pulmonary disease)   . History of breast cancer ONCOLOGIST-- DR Jana Hakim---  NO RECURRANCE    DX 07/2012;  LOW GRADE DCIS  ER+PR+  ----  S/P RIGHT LUMPECTOMY WITH NEGATIVE MARGINS/   RADIATION ENDED 11/2012  . History of chronic bronchitis   . Pelvic pain   . Frequency of urination   . Urgency of urination   . Chronic constipation   . History of tachycardia     CONTROLLED  WITH ATENOLOL  . History of basal cell carcinoma excision     X2  . Sinus headache   . H/O hiatal hernia     PAST SURGICAL HISTORY: Past Surgical History  Procedure Laterality Date  . Nasal sinus surgery  1985  . Cardiovascular stress test  06-18-2012  DR McALHANY    LOW RISK NUCLEAR STUDY/  SMALL FIXED AREA OF MODERATELY DECREASED UPTAKE IN ANTEROSEPTAL WALL WHICH MAY BE ARTIFACTUAL/  NO ISCHEMIA/  EF 68%  . Transthoracic echocardiogram  06-24-2012    GRADE I DIASTOLIC DYSFUNCTION/  EF 55-60%/  MILD MR  . Right breast bx  08-23-2012  . Removal vocal cord cyst  FEB 2014  . Cardiac catheterization  09-13-2007  DR Lia Foyer    WELL-PRESERVED LVF/  DIFFUSE SCATTERED CORONARY CALCIFACATION AND ATHEROSCLEROSIS WITHOUT OBSTRUCTION  . Cardiac catheterization  08-04-2010  DR [facility]    NON-OBSTRUCTIVE CAD/  pLAD 40%/  oLAD 30%/  mLAD 30%/  pRCA 30%/  EF 60%  . Colonoscopy  09-29-2010  . Breast lumpectomy Right 10-11-2012    W/ SLN BX  .  Total abdominal hysterectomy w/ bilateral salpingoophorectomy  1982    W/  APPENDECTOMY  . Tonsillectomy and adenoidectomy  AGE 1  . Orif right ankle  fx  2006  . Right hand surgery  X3  LAST ONE 2009    INCLUDES  ORIF RIGHT 5TH FINGER AND REVISION TWICE  . Cystoscopy with hydrodistension and biopsy N/A 03/06/2014    Procedure: CYSTOSCOPY/HYDRODISTENSION/ INSTILATION OF MARCAINE AND PYRIDIUM;  Surgeon: Ailene Rud, MD;  Location: [facility];  Service: Urology;  Laterality: N/A;    FAMILY HISTORY Family History  Problem Relation Age of Onset  . Rectal cancer Mother   . Cancer Mother     rectal,  breast, pancreatic  . Irritable bowel syndrome Son   . Irritable bowel syndrome Daughter   . Heart disease Other   . Arthritis Other   . Cancer Other     breast cancer 1st degree relative <50, lung  . Diabetes Other     1st degree relative   . Hyperlipidemia Other   . Hypertension Other   . Osteoporosis Other   . Coronary artery disease Other 65  . Cancer Maternal Aunt     breast    GYNECOLOGIC HISTORY: Menarche age 1, first live birth age 30, TAH/BSO in 57. She was on hormone replacement between 1982 and 2013, at the time of her breast cancer diagnosis.  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 Her husband [name] used to be Insurance underwriter for [employer], now is retired and has helped manage a friend's car lot, something he sees as an location of his ministry. Their children are Minda Ditto, who is an Chief Financial Officer with [employer] in [place]; Skip Estimable, who works as an Radio producer for [employer] and lives in [place]; and [place], who works as a Radio producer (fourth-grade) in [place].   ADVANCED DIRECTIVES: In place  HEALTH MAINTENANCE: History  Substance Use Topics  . Smoking status: Former Smoker -- 1.00 packs/day for 15 years    Types:  Cigarettes    Quit date: 05/27/2012  . Smokeless tobacco: Never Used  . Alcohol Use: No     Comment: occasional     Colonoscopy: 09/29/2010 Erskine Emery)  PAP: JAN 2013 ([place])  Bone density: 04/17/2011/ osteoporosis  Lipid panel:  Allergies  Allergen Reactions  . Clindamycin Hcl Shortness Of Breath and Rash  . Penicillins Anaphylaxis  . Prednisone Shortness Of Breath and Rash  . Codeine Hives and Other (See Comments)    headache  . Fluzone [Flu Virus Vaccine] Other (See Comments)    Local reaction at the site  . Latex Hives  . Pentazocine Lactate Other (See Comments)    HALLUCINATION  . Pneumococcal Vaccine Polyvalent Hives, Swelling and Other (See  Comments)    REACTION: redness, swelling, and hives at injection site  . Tamoxifen Nausea And Vomiting and Other (See Comments)    HEADACHE    Current Outpatient Prescriptions  Medication Sig Dispense Refill  . acetaminophen (TYLENOL) 500 MG tablet Take 1,000 mg by mouth every 6 (six) hours as needed for mild pain or headache.     . albuterol (PROVENTIL HFA;VENTOLIN HFA) 108 (90 BASE) MCG/ACT inhaler Inhale 2 puffs into the lungs every 4 (four) hours as needed. 1 Inhaler 5  . aspirin EC 81 MG tablet Take 1 tablet (81 mg total) by mouth daily. 90 tablet 3  . atenolol (TENORMIN) 50 MG tablet Take 1 tablet (50 mg total) by mouth every morning. 90 tablet 1  . DULoxetine (CYMBALTA) 60 MG capsule Take 1 capsule (60 mg total) by mouth 2 (two) times daily. 180 capsule 3  . ELMIRON 100 MG capsule Take 1 capsule by mouth 3 (three) times daily as needed. When cystitis symptoms arises.    Marland Kitchen estradiol (ESTRACE) 0.1 MG/GM vaginal cream Place 1 Applicatorful vaginally 2 (two) times a week. ALTERNATES WITH ESTRADIAL TABLET    . fluticasone (FLONASE) 50 MCG/ACT nasal spray Place 1 spray into the nose as needed for allergies.     . furosemide (LASIX) 20 MG tablet Take 1 tablet (20 mg total) by mouth every morning. 180 tablet 1  . gabapentin  (NEURONTIN) 300 MG capsule Take 1 capsule (300 mg total) by mouth 2 (two) times daily. 60 capsule 6  . hydrocortisone-pramoxine (ANALPRAM HC) 2.5-1 % rectal cream Place 1 application rectally 3 (three) times daily. 30 g 0  . loratadine (CLARITIN) 10 MG tablet Take 1 tablet (10 mg total) by mouth daily. 30 tablet 11  . omeprazole (PRILOSEC) 40 MG capsule Take 40 mg by mouth 2 (two) times daily.    . potassium chloride SA (K-DUR,KLOR-CON) 20 MEQ tablet Take 1 tablet (20 mEq total) by mouth daily.    . Probiotic Product (ACIDOPHILUS PROBIOTIC COMPLEX PO) Take 1 capsule by mouth daily.    . traMADol (ULTRAM) 50 MG tablet Take 50-100 mg by mouth 3 (three) times daily as needed for moderate pain.     . traZODone (DESYREL) 100 MG tablet Take 1 tablet (100 mg total) by mouth at bedtime. 180 tablet 1  . URELLE (URELLE/URISED) 81 MG TABS tablet Take 1 tablet (81 mg total) by mouth 3 (three) times daily. (Patient taking differently: Take 1 tablet by mouth 3 (three) times daily as needed for bladder spasms. ) 30 each 2   No current facility-administered medications for this visit.   Facility-Administered Medications Ordered in Other Visits  Medication Dose Route Frequency Provider Last Rate Last Dose  . bupivacaine (MARCAINE) 0.5 % 10 mL, triamcinolone acetonide (KENALOG-40) 40 mg injection   Subcutaneous Once Carolan Clines, MD      . bupivacaine (MARCAINE) 0.5 % 15 mL, phenazopyridine (PYRIDIUM) 400 mg bladder mixture   Bladder Instillation Once Carolan Clines, MD        OBJECTIVE: middle-aged white woman  who appears well Filed Vitals:   04/27/15 1212  BP: 124/62  Pulse: 78  Temp: 97.4 F (36.3 C)  Resp: 18     Body mass index is 19.89 kg/(m^2).    ECOG FS: 1  Sclerae unicteric, EOMs intact Oropharynx clear and moist;   No cervical or supraclavicular adenopathy Lungs no rales or rhonchi Heart regular rate and rhythm Abd soft, nontender, positive bowel sounds MSK no  Focal  spinal  tenderness,, no upper extremity lymphedema Neuro: nonfocal, well oriented,  positive affect Breasts: The right breast is status post lumpectomy. There is no evidence of local recurrence. The right axilla is benign. The left breast is unremarkable.   LAB RESULTS: Lab Results  Component Value Date   WBC 5.0 04/20/2015   NEUTROABS 3.0 04/20/2015   HGB 11.3* 04/20/2015   HCT 36.0 04/20/2015   MCV 90.9 04/20/2015   PLT 221 04/20/2015      Chemistry      Component Value Date/Time   NA 140 04/20/2015 1050   NA 136 03/01/2015 1051   K 3.9 04/20/2015 1050   K 4.1 03/01/2015 1051   CL 102 03/01/2015 1051   CL 103 11/12/2012 1549   CO2 26 04/20/2015 1050   CO2 31 03/01/2015 1051   BUN 13.1 04/20/2015 1050   BUN 15 03/01/2015 1051   CREATININE 1.1 04/20/2015 1050   CREATININE 1.15 03/01/2015 1051   CREATININE 1.11* 07/27/2014 1227      Component Value Date/Time   CALCIUM 8.9 04/20/2015 1050   CALCIUM 9.3 03/01/2015 1051   ALKPHOS 87 04/20/2015 1050   ALKPHOS 85 03/01/2015 1051   AST 23 04/20/2015 1050   AST 24 03/01/2015 1051   ALT 16 04/20/2015 1050   ALT 15 03/01/2015 1051   BILITOT 0.26 04/20/2015 1050   BILITOT 0.3 03/01/2015 1051       No results found for: LABCA2  No components found for: EXNTZ001  No results for input(s): INR in the last 168 hours.  Urinalysis    Component Value Date/Time   COLORURINE YELLOW 01/06/2014 [place] 01/06/2014 1219   LABSPEC 1.015 01/06/2014 1219   PHURINE 7.0 01/06/2014 1219   GLUCOSEU NEG 01/06/2014 1219   GLUCOSEU NEGATIVE 07/19/2011 1457   HGBUR NEG 01/06/2014 1219   BILIRUBINUR neg 09/25/2014 1232   BILIRUBINUR NEG 01/06/2014 1219   KETONESUR NEG 01/06/2014 1219   PROTEINUR neg 09/25/2014 1232   PROTEINUR NEG 01/06/2014 1219   UROBILINOGEN 0.2 09/25/2014 1232   UROBILINOGEN 0.2 01/06/2014 1219   NITRITE neg 09/25/2014 1232   NITRITE NEG 01/06/2014 1219   LEUKOCYTESUR Negative 09/25/2014 1232     STUDIES: Study Result   No results found. CLINICAL DATA: Followup pulmonary nodule  EXAM: CT CHEST WITH CONTRAST  TECHNIQUE: Multidetector CT imaging of the chest was performed during intravenous contrast administration.  CONTRAST: 34mL OMNIPAQUE IOHEXOL 300 MG/ML SOLN  COMPARISON: 01/01/2015  FINDINGS: Mediastinum: Normal heart size. There is no pericardial effusion identified. Calcified atherosclerotic disease noted involving the LAD Coronary artery. The trachea is patent and appears midline. There is a normal appearance of the esophagus. No mediastinal or hilar lymph nodes identified.  Lungs/Pleura: No pleural effusion noted. 5 mm right upper lobe nodule is unchanged from previous exam, image 17 of series 3. Within the basilar portion of a right middle lobe there is a stable scar like opacity, image 30/series 3. No new pulmonary nodules identified.  Upper Abdomen: Right hepatic lobe hemangioma is again noted, image 62/series 2. The visualized portions of the adrenal glands and spleen are normal.  Musculoskeletal: There are no aggressive lytic or sclerotic bone lesions identified. There is a mild scoliosis deformity and multi level degenerative disc disease.  IMPRESSION: 1. No acute cardiopulmonary abnormalities. 2. Stable 5 mm right upper lobe nodule. If this patient is at increased risk for lung cancer then the next followup exam should be obtained If the patient is at  high risk for bronchogenic carcinoma, follow-up chest CT at 6-12 months is recommended. If the patient is at low risk for bronchogenic carcinoma, follow-up chest CT at 12 months is recommended. This recommendation follows the consensus statement: Guidelines for Management of Small Pulmonary Nodules Detected on CT Scans: A Statement from the [place] as published in Radiology 2005;237:395-400.   Electronically Signed  By: Kerby Moors M.D.  On: 02/23/2015  12:29    ASSESSMENT: 69 y.o. Jule Ser woman s/p Right lumpectomy 10/11/2012 for low-grade ductal carcinoma in situ, estrogen and progesterone receptor positive, with negative though close margins  (1) radiation therapy completed 12/05/2012  (2) tamoxifen started at the completion of radiation, tolerated poorly, discontinued 03/10/2013  (3) breast density category D  PLAN:  Lurie is  2 half years out from her definitive surgery. There is no evidence of disease recurrence. She does have very dense breasts and a significant family history, so I think it is prudent to continue to do yearly MRIs, which I have ordered.   I think she would be a good candidate for our survivorship clinic. I discussed this with her and she is interested. She will see our survivorship nurse practitioner a year from now. She will see me for one final visit 2 years from now.   submandibular lymph nodes or glands are usually related to dental or sinus problems. There are not going to be related to her history of breast cancer which in any case was noninvasive. As far as her very small stable lung nodule, this is followed by pulmonary. She tells me a repeat CT scan has been scheduled for later this year.   I did discuss with her that we have very little data regarding using vaginal estrogens in women with a history of breast cancer. This may be totally safe or it might minimally increase the risk of breast cancer developing very recently have very little data in that setting.  I also gave her information on our "intimacy and pelvic health" program.   Now that she is off statins and feeling much better she is worrying about cholesterol. She could consider red yeast Rice or similar non-studding options. She will discuss this with her primary care physician.   Solange has a good understanding of the overall plan. She agrees with it. She we'll call with any problems that may develop before her next visit here.     Zachari Alberta C    04/27/2015

## 2015-04-30 ENCOUNTER — Other Ambulatory Visit (HOSPITAL_COMMUNITY)
Admission: RE | Admit: 2015-04-30 | Discharge: 2015-04-30 | Disposition: A | Discharge status: Home / Self Care | Payer: Medicare Other | Source: Ambulatory Visit | Attending: Medical | Admitting: Medical

## 2015-04-30 ENCOUNTER — Encounter: Payer: Self-pay | Admitting: Medical

## 2015-04-30 ENCOUNTER — Ambulatory Visit (INDEPENDENT_AMBULATORY_CARE_PROVIDER_SITE_OTHER): Payer: Medicare Other | Admitting: Medical

## 2015-04-30 ENCOUNTER — Other Ambulatory Visit: Payer: Self-pay | Admitting: Medical

## 2015-04-30 ENCOUNTER — Ambulatory Visit (HOSPITAL_BASED_OUTPATIENT_CLINIC_OR_DEPARTMENT_OTHER)
Admission: RE | Admit: 2015-04-30 | Discharge: 2015-04-30 | Disposition: A | Discharge status: Home / Self Care | Payer: Medicare Other | Source: Ambulatory Visit | Attending: Medical | Admitting: Medical

## 2015-04-30 ENCOUNTER — Telehealth: Payer: Self-pay | Admitting: Medical

## 2015-04-30 VITALS — BP 115/75 | HR 74 | Temp 97.8°F | Ht 65.0 in | Wt 121.0 lb

## 2015-04-30 DIAGNOSIS — M545 Low back pain, unspecified: Secondary | ICD-10-CM

## 2015-04-30 DIAGNOSIS — M25561 Pain in right knee: Secondary | ICD-10-CM

## 2015-04-30 DIAGNOSIS — M5432 Sciatica, left side: Secondary | ICD-10-CM

## 2015-04-30 DIAGNOSIS — M5430 Sciatica, unspecified side: Secondary | ICD-10-CM | POA: Insufficient documentation

## 2015-04-30 DIAGNOSIS — M25552 Pain in left hip: Secondary | ICD-10-CM | POA: Insufficient documentation

## 2015-04-30 DIAGNOSIS — M25562 Pain in left knee: Secondary | ICD-10-CM

## 2015-04-30 DIAGNOSIS — Z853 Personal history of malignant neoplasm of breast: Secondary | ICD-10-CM | POA: Insufficient documentation

## 2015-04-30 DIAGNOSIS — R82998 Other abnormal findings in urine: Secondary | ICD-10-CM | POA: Insufficient documentation

## 2015-04-30 DIAGNOSIS — M85852 Other specified disorders of bone density and structure, left thigh: Secondary | ICD-10-CM | POA: Insufficient documentation

## 2015-04-30 DIAGNOSIS — N390 Urinary tract infection, site not specified: Secondary | ICD-10-CM

## 2015-04-30 DIAGNOSIS — Z113 Encounter for screening for infections with a predominantly sexual mode of transmission: Secondary | ICD-10-CM | POA: Diagnosis present

## 2015-04-30 DIAGNOSIS — N760 Acute vaginitis: Secondary | ICD-10-CM | POA: Diagnosis present

## 2015-04-30 DIAGNOSIS — N898 Other specified noninflammatory disorders of vagina: Secondary | ICD-10-CM

## 2015-04-30 HISTORY — DX: Sciatica, unspecified side: M54.30

## 2015-04-30 HISTORY — DX: Pain in left hip: M25.552

## 2015-04-30 HISTORY — DX: Pain in right knee: M25.561

## 2015-04-30 LAB — POCT URINALYSIS DIPSTICK
Bilirubin, UA: NEGATIVE
Blood, UA: NEGATIVE
GLUCOSE UA: NEGATIVE
KETONES UA: NEGATIVE
NITRITE UA: NEGATIVE
Protein, UA: NEGATIVE
Spec Grav, UA: 1.01
Urobilinogen, UA: 0.2
pH, UA: 6.5

## 2015-04-30 MED ORDER — CIPROFLOXACIN HCL 500 MG PO TABS: 500.0000 mg | ORAL_TABLET | Freq: Two times a day (BID) | ORAL | Status: DC

## 2015-04-30 NOTE — Assessment & Plan Note (Signed)
Lt hip pain- may be trochanteric bursitis. Ibuprofen 200 mg 1 tab every 8  Hours for 3 days. Then stop due to moderate low gfr.. Xray hip today. Tramadol 2 tab every 6 hours.

## 2015-04-30 NOTE — Patient Instructions (Signed)
Lt hip pain- may be trochanteric bursitis. Ibuprofen 200 mg 1 tab every 8  Hours for 3 days. Then stop due to moderate low gfr.. Xray hip today. Tramadol 2 tab every 6 hours.  Lt sciatica- Pillow between leg at night.   Rt knee pain- xray of knee. Pain may be secondary after gait change from above.  Infection fighting cells in urine. Some faint pain over lt kidney. Rx cipro for 3 days pending urine culture and urine anicllary studies.

## 2015-04-30 NOTE — Assessment & Plan Note (Signed)
Infection fighting cells in urine. Some faint pain over lt kidney. Rx cipro for 3 days pending urine culture and urine anicllary studies.  Pt has fishy odor at times but wait on ancillary studies then if + rx flagyl. With her left cva pain decided on cipro pending the studies.

## 2015-04-30 NOTE — Telephone Encounter (Signed)
Talked with pt on need to order bodyscan for the area found on femur. Will try to talk with radiology and get that scheduled hopefully by early next week. Will attempt to call pt back by Monday afternoon.

## 2015-04-30 NOTE — Assessment & Plan Note (Signed)
Rt knee pain- xray of knee. Pain may be secondary after gait change from above.

## 2015-04-30 NOTE — Assessment & Plan Note (Signed)
Ibuprofen 200 mg 1 tab every 8  Hours for 3 days. Then stop due to moderate low gfr Tramadol 2 tab every 6 hours.  Lt sciatica- Pillow between leg at night

## 2015-04-30 NOTE — Progress Notes (Signed)
Pre visit review using our clinic review tool, if applicable. No additional management support is needed unless otherwise documented below in the visit note. 

## 2015-04-30 NOTE — Progress Notes (Signed)
Subjective:    Patient ID: [name], female    DOB: 21-Sep-1946, 69 y.o.   MRN: [id number]  HPI  Pt in with some left hip pain. Pt remembers falling in summer. She never got evaluated. Pt had some gradual improvement but never got better completely. Then last week had some pain in her left hip. But also some left si area pain. Pt has changed her gait since fall. Then 2 months ago noted some rt knee pain. Knee pain is much less than left hip and lt si area.   Pt states vaginal discharge just recently. No itch. Sometimes faint mild white dc. Then sometimes fishy odor. Pt states 6 wks got monistat. But around then had been on antibiotics. Recently no antibiotics.  Some fishy odor recently.     Review of Systems  Constitutional: Negative for fever and fatigue.  Respiratory: Negative for cough, shortness of breath and wheezing.   Cardiovascular: Negative for chest pain and palpitations.  Genitourinary: Negative.        Fishy odor at times. Otherwise negative.  Musculoskeletal: Positive for back pain.       Lt hip , lt si and rt knee  Pain.  Neurological: Negative.   Hematological: Negative for adenopathy.  Psychiatric/Behavioral: Negative for behavioral problems and confusion.     Past Medical History  Diagnosis Date  . GERD (gastroesophageal reflux disease)   . Arthritis   . Asthma   . Hypertension   . History of colonic polyps     BENIGN  . Fibromyalgia   . S/P radiation therapy 11/12/12 - 12/05/12    right Breast  . CAD (coronary artery disease) CARDIOLOGIST--  DR MCALHANY    mild non-obstructive cad  . COPD (chronic obstructive pulmonary disease)   . History of breast cancer ONCOLOGIST-- DR Jana Hakim---  NO RECURRANCE    DX 07/2012;  LOW GRADE DCIS  ER+PR+  ----  S/P RIGHT LUMPECTOMY WITH NEGATIVE MARGINS/   RADIATION ENDED 11/2012  . History of chronic bronchitis   . Pelvic pain   . Frequency of urination   . Urgency of urination   . Chronic constipation   . History  of tachycardia     CONTROLLED  WITH ATENOLOL  . History of basal cell carcinoma excision     X2  . Sinus headache   . H/O hiatal hernia     History   Social History  . Marital Status: Married    Spouse Name: N/A  . Number of Children: 3  . Years of Education: N/A   Occupational History  . Retired Therapist, sports    Social History Main Topics  . Smoking status: Former Smoker -- 1.00 packs/day for 15 years    Types: Cigarettes    Quit date: 05/27/2012  . Smokeless tobacco: Never Used  . Alcohol Use: No     Comment: occasional  . Drug Use: No  . Sexual Activity: Not on file   Other Topics Concern  . Not on file   Social History Narrative   Exercise-- 2days a week [employer],  [employer], walking     Past Surgical History  Procedure Laterality Date  . Nasal sinus surgery  1985  . Cardiovascular stress test  06-18-2012  DR McALHANY    LOW RISK NUCLEAR STUDY/  SMALL FIXED AREA OF MODERATELY DECREASED UPTAKE IN ANTEROSEPTAL WALL WHICH MAY BE ARTIFACTUAL/  NO ISCHEMIA/  EF 68%  . Transthoracic echocardiogram  06-24-2012    GRADE I DIASTOLIC  DYSFUNCTION/  EF 55-60%/  MILD MR  . Right breast bx  08-23-2012  . Removal vocal cord cyst  FEB 2014  . Cardiac catheterization  09-13-2007  DR Lia Foyer    WELL-PRESERVED LVF/  DIFFUSE SCATTERED CORONARY CALCIFACATION AND ATHEROSCLEROSIS WITHOUT OBSTRUCTION  . Cardiac catheterization  08-04-2010  DR [facility]    NON-OBSTRUCTIVE CAD/  pLAD 40%/  oLAD 30%/  mLAD 30%/  pRCA 30%/  EF 60%  . Colonoscopy  09-29-2010  . Breast lumpectomy Right 10-11-2012    W/ SLN BX  . Total abdominal hysterectomy w/ bilateral salpingoophorectomy  1982    W/  APPENDECTOMY  . Tonsillectomy and adenoidectomy  AGE 56  . Orif right ankle  fx  2006  . Right hand surgery  X3  LAST ONE 2009    INCLUDES  ORIF RIGHT 5TH FINGER AND REVISION TWICE  . Cystoscopy with hydrodistension and biopsy N/A 03/06/2014    Procedure: CYSTOSCOPY/HYDRODISTENSION/ INSTILATION OF MARCAINE AND  PYRIDIUM;  Surgeon: Ailene Rud, MD;  Location: [facility];  Service: Urology;  Laterality: N/A;    Family History  Problem Relation Age of Onset  . Rectal cancer Mother   . Cancer Mother     rectal,  breast, pancreatic  . Irritable bowel syndrome Son   . Irritable bowel syndrome Daughter   . Heart disease Other   . Arthritis Other   . Cancer Other     breast cancer 1st degree relative <50, lung  . Diabetes Other     1st degree relative   . Hyperlipidemia Other   . Hypertension Other   . Osteoporosis Other   . Coronary artery disease Other 65  . Cancer Maternal Aunt     breast    Allergies  Allergen Reactions  . Clindamycin Hcl Shortness Of Breath and Rash  . Penicillins Anaphylaxis  . Prednisone Shortness Of Breath and Rash  . Codeine Hives and Other (See Comments)    headache  . Fluzone [Flu Virus Vaccine] Other (See Comments)    Local reaction at the site  . Latex Hives  . Pentazocine Lactate Other (See Comments)    HALLUCINATION  . Pneumococcal Vaccine Polyvalent Hives, Swelling and Other (See Comments)    REACTION: redness, swelling, and hives at injection site  . Tamoxifen Nausea And Vomiting and Other (See Comments)    HEADACHE    Current Outpatient Prescriptions on File Prior to Visit  Medication Sig Dispense Refill  . acetaminophen (TYLENOL) 500 MG tablet Take 1,000 mg by mouth every 6 (six) hours as needed for mild pain or headache.     . albuterol (PROVENTIL HFA;VENTOLIN HFA) 108 (90 BASE) MCG/ACT inhaler Inhale 2 puffs into the lungs every 4 (four) hours as needed. 1 Inhaler 5  . aspirin EC 81 MG tablet Take 1 tablet (81 mg total) by mouth daily. 90 tablet 3  . atenolol (TENORMIN) 50 MG tablet Take 1 tablet (50 mg total) by mouth every morning. 90 tablet 1  . DULoxetine (CYMBALTA) 60 MG capsule Take 1 capsule (60 mg total) by mouth 2 (two) times daily. 180 capsule 3  . ELMIRON 100 MG capsule Take 1 capsule by mouth 3 (three) times  daily as needed. When cystitis symptoms arises.    Marland Kitchen estradiol (ESTRACE) 0.1 MG/GM vaginal cream Place 1 Applicatorful vaginally 2 (two) times a week. ALTERNATES WITH ESTRADIAL TABLET    . fluticasone (FLONASE) 50 MCG/ACT nasal spray Place 1 spray into the nose as needed for  allergies.     . furosemide (LASIX) 20 MG tablet Take 1 tablet (20 mg total) by mouth every morning. 180 tablet 1  . gabapentin (NEURONTIN) 300 MG capsule Take 1 capsule (300 mg total) by mouth 2 (two) times daily. 60 capsule 6  . hydrocortisone-pramoxine (ANALPRAM HC) 2.5-1 % rectal cream Place 1 application rectally 3 (three) times daily. 30 g 0  . loratadine (CLARITIN) 10 MG tablet Take 1 tablet (10 mg total) by mouth daily. 30 tablet 11  . omeprazole (PRILOSEC) 40 MG capsule Take 40 mg by mouth 2 (two) times daily.    . potassium chloride SA (K-DUR,KLOR-CON) 20 MEQ tablet Take 1 tablet (20 mEq total) by mouth daily.    . Probiotic Product (ACIDOPHILUS PROBIOTIC COMPLEX PO) Take 1 capsule by mouth daily.    . traMADol (ULTRAM) 50 MG tablet Take 50-100 mg by mouth 3 (three) times daily as needed for moderate pain.     . traZODone (DESYREL) 100 MG tablet Take 1 tablet (100 mg total) by mouth at bedtime. 180 tablet 1  . URELLE (URELLE/URISED) 81 MG TABS tablet Take 1 tablet (81 mg total) by mouth 3 (three) times daily. (Patient taking differently: Take 1 tablet by mouth 3 (three) times daily as needed for bladder spasms. ) 30 each 2   Current Facility-Administered Medications on File Prior to Visit  Medication Dose Route Frequency Provider Last Rate Last Dose  . bupivacaine (MARCAINE) 0.5 % 10 mL, triamcinolone acetonide (KENALOG-40) 40 mg injection   Subcutaneous Once Carolan Clines, MD      . bupivacaine (MARCAINE) 0.5 % 15 mL, phenazopyridine (PYRIDIUM) 400 mg bladder mixture   Bladder Instillation Once Carolan Clines, MD        BP 115/75 mmHg  Pulse 74  Temp(Src) 97.8 F (36.6 C) (Oral)  Ht 5\' 5"  (1.651 m)   Wt 121 lb (54.885 kg)  BMI 20.14 kg/m2  SpO2 98%       Objective:   Physical Exam  General Appearance- Not in acute distress.    Chest and Lung Exam Auscultation: Breath sounds:-Normal. Clear even and unlabored. Adventitious sounds:- No Adventitious sounds.  Cardiovascular Auscultation:Rythm - Regular, rate and rythm. Heart Sounds -Normal heart sounds.  Abdomen Inspection:-Inspection Normal.  Palpation/Perucssion: Palpation and Percussion of the abdomen reveal- Non Tender, No Rebound tenderness, No rigidity(Guarding) and No Palpable abdominal masses.  Liver:-Normal.  Spleen:- Normal.   Back No mid  lumbar spine tenderness to palpation. Lt si area pain on direct palpation. Faint lt cva tender  Lt hip- very tender to palpation over greater trochanter. And on abuction/adduction/rotation.  Rt knee- moderate pain on flexion and extension.      Assessment & Plan:

## 2015-05-02 LAB — URINE CULTURE
Colony Count: NO GROWTH
ORGANISM ID, BACTERIA: NO GROWTH

## 2015-05-03 ENCOUNTER — Telehealth: Payer: Self-pay | Admitting: Family Medicine

## 2015-05-03 DIAGNOSIS — Z853 Personal history of malignant neoplasm of breast: Secondary | ICD-10-CM

## 2015-05-03 DIAGNOSIS — C799 Secondary malignant neoplasm of unspecified site: Secondary | ICD-10-CM

## 2015-05-03 DIAGNOSIS — R936 Abnormal findings on diagnostic imaging of limbs: Secondary | ICD-10-CM

## 2015-05-03 DIAGNOSIS — R937 Abnormal findings on diagnostic imaging of other parts of musculoskeletal system: Secondary | ICD-10-CM

## 2015-05-03 DIAGNOSIS — C50919 Malignant neoplasm of unspecified site of unspecified female breast: Secondary | ICD-10-CM

## 2015-05-03 LAB — URINE CYTOLOGY ANCILLARY ONLY
BACTERIAL VAGINITIS: NEGATIVE
Candida vaginitis: NEGATIVE
TRICH (WINDOWPATH): NEGATIVE

## 2015-05-03 NOTE — Telephone Encounter (Signed)
°  Relation to pt: self  Call back number: [phone] Pharmacy:  Reason for call:  Pt inquiring about x ray results

## 2015-05-04 ENCOUNTER — Telehealth: Payer: Self-pay | Admitting: Medical

## 2015-05-04 ENCOUNTER — Other Ambulatory Visit: Payer: Self-pay | Admitting: Medical

## 2015-05-04 NOTE — Telephone Encounter (Signed)
Will send referral note. Asking staff to update me when bone scan is.

## 2015-05-04 NOTE — Telephone Encounter (Signed)
Called patient with results.  

## 2015-05-04 NOTE — Telephone Encounter (Signed)
I called pt to talk with her. I had to leave a message that I put order of bone scan in stat. Hopefully that will get done quickly and notifed Pleas Koch got message and would be working on getting scan scheduled. Any questions could talk with Pleas Koch or call back for med.

## 2015-05-04 NOTE — Telephone Encounter (Signed)
Placed bone scan order.

## 2015-05-05 ENCOUNTER — Ambulatory Visit (HOSPITAL_COMMUNITY)
Admission: RE | Admit: 2015-05-05 | Discharge: 2015-05-05 | Disposition: A | Discharge status: Home / Self Care | Payer: Medicare Other | Source: Ambulatory Visit | Attending: Medical | Admitting: Medical

## 2015-05-05 ENCOUNTER — Encounter (HOSPITAL_COMMUNITY)
Admission: RE | Admit: 2015-05-05 | Discharge: 2015-05-05 | Disposition: A | Discharge status: Home / Self Care | Payer: Medicare Other | Source: Ambulatory Visit | Attending: Medical | Admitting: Medical

## 2015-05-05 ENCOUNTER — Telehealth: Payer: Self-pay | Admitting: Family Medicine

## 2015-05-05 DIAGNOSIS — R937 Abnormal findings on diagnostic imaging of other parts of musculoskeletal system: Secondary | ICD-10-CM | POA: Diagnosis present

## 2015-05-05 DIAGNOSIS — M25552 Pain in left hip: Secondary | ICD-10-CM

## 2015-05-05 DIAGNOSIS — R936 Abnormal findings on diagnostic imaging of limbs: Secondary | ICD-10-CM

## 2015-05-05 DIAGNOSIS — Z853 Personal history of malignant neoplasm of breast: Secondary | ICD-10-CM

## 2015-05-05 DIAGNOSIS — J040 Acute laryngitis: Secondary | ICD-10-CM

## 2015-05-05 MED ORDER — TECHNETIUM TC 99M MEDRONATE IV KIT
27.0000 | PACK | Freq: Once | INTRAVENOUS | Status: AC | PRN
  Administered 2015-05-05: 27 via INTRAVENOUS

## 2015-05-05 NOTE — Telephone Encounter (Signed)
Pt is having Bone Scan on 05/05/15

## 2015-05-05 NOTE — Telephone Encounter (Signed)
Notified pt of bone scan which impression was no appearance of osseous metastasis. Continue follow ups with oncologist.  Pt did mentions hoarse voice and sore throat for 9 months. Refer to ENT. Pt has  Persistent left hip pain. Will refer to ortho for evaluation of possible trochanteric bursitis.

## 2015-05-05 NOTE — Telephone Encounter (Signed)
Caller name: Elania Relation to pt: self Call back number: [phone] Pharmacy:  Reason for call:   Requesting nuclear bone scan result.

## 2015-05-05 NOTE — Telephone Encounter (Signed)
Thanks for the update

## 2015-05-06 ENCOUNTER — Other Ambulatory Visit: Payer: Self-pay | Admitting: Oncology

## 2015-05-12 ENCOUNTER — Other Ambulatory Visit: Payer: Self-pay | Admitting: Internal Medicine

## 2015-05-12 DIAGNOSIS — M79605 Pain in left leg: Secondary | ICD-10-CM

## 2015-05-26 ENCOUNTER — Telehealth: Payer: Self-pay | Admitting: General Practice

## 2015-05-26 ENCOUNTER — Ambulatory Visit
Admission: RE | Admit: 2015-05-26 | Discharge: 2015-05-26 | Disposition: A | Discharge status: Home / Self Care | Payer: Medicare Other | Source: Ambulatory Visit | Attending: Internal Medicine | Admitting: Internal Medicine

## 2015-05-26 DIAGNOSIS — M79605 Pain in left leg: Secondary | ICD-10-CM

## 2015-05-26 MED ORDER — TRAMADOL HCL 50 MG PO TABS: 50.0000 mg | ORAL_TABLET | Freq: Three times a day (TID) | ORAL | Status: DC | PRN

## 2015-05-26 NOTE — Telephone Encounter (Signed)
Last OV 03/01/15 Tramadol last filled 03/03/14 #180 with 0

## 2015-05-26 NOTE — Telephone Encounter (Signed)
Ok for #180 

## 2015-05-26 NOTE — Telephone Encounter (Signed)
Med filled and faxed.  

## 2015-06-11 ENCOUNTER — Other Ambulatory Visit: Payer: Self-pay | Admitting: Family Medicine

## 2015-06-11 NOTE — Telephone Encounter (Signed)
Med filled.  

## 2015-06-15 ENCOUNTER — Ambulatory Visit (INDEPENDENT_AMBULATORY_CARE_PROVIDER_SITE_OTHER): Payer: Medicare Other | Admitting: Physician Assistant

## 2015-06-15 ENCOUNTER — Encounter: Payer: Self-pay | Admitting: Physician Assistant

## 2015-06-15 VITALS — BP 113/53 | HR 80 | Temp 98.7°F | Ht 65.0 in | Wt 117.2 lb

## 2015-06-15 DIAGNOSIS — J040 Acute laryngitis: Secondary | ICD-10-CM | POA: Diagnosis not present

## 2015-06-15 DIAGNOSIS — J019 Acute sinusitis, unspecified: Secondary | ICD-10-CM

## 2015-06-15 DIAGNOSIS — B9689 Other specified bacterial agents as the cause of diseases classified elsewhere: Secondary | ICD-10-CM

## 2015-06-15 MED ORDER — DOXYCYCLINE HYCLATE 100 MG PO CAPS: 100.0000 mg | ORAL_CAPSULE | Freq: Two times a day (BID) | ORAL | Status: DC

## 2015-06-15 NOTE — Assessment & Plan Note (Signed)
Rx Doxycycline.  Increase fluids.  Rest.  Saline nasal spray.  Probiotic.  Mucinex as directed.  Humidifier in bedroom. Continue allergy medications.  Call or return to clinic if symptoms are not improving.

## 2015-06-15 NOTE — Progress Notes (Signed)
History of Present Illness: [name] is a 69 y.o. female who present to the clinic today complaining of sinus pressure, sinus pain and nasal congestion. Patient endorses dry cough.  Patient denies chest pain or shortness of breath.   History: Past Medical History  Diagnosis Date  . GERD (gastroesophageal reflux disease)   . Arthritis   . Asthma   . Hypertension   . History of colonic polyps     BENIGN  . Fibromyalgia   . S/P radiation therapy 11/12/12 - 12/05/12    right Breast  . CAD (coronary artery disease) CARDIOLOGIST--  DR MCALHANY    mild non-obstructive cad  . COPD (chronic obstructive pulmonary disease)   . History of breast cancer ONCOLOGIST-- DR Jana Hakim---  NO RECURRANCE    DX 07/2012;  LOW GRADE DCIS  ER+PR+  ----  S/P RIGHT LUMPECTOMY WITH NEGATIVE MARGINS/   RADIATION ENDED 11/2012  . History of chronic bronchitis   . Pelvic pain   . Frequency of urination   . Urgency of urination   . Chronic constipation   . History of tachycardia     CONTROLLED  WITH ATENOLOL  . History of basal cell carcinoma excision     X2  . Sinus headache   . H/O hiatal hernia     Current outpatient prescriptions:  .  acetaminophen (TYLENOL) 500 MG tablet, Take 1,000 mg by mouth every 6 (six) hours as needed for mild pain or headache. , Disp: , Rfl:  .  albuterol (PROVENTIL HFA;VENTOLIN HFA) 108 (90 BASE) MCG/ACT inhaler, Inhale 2 puffs into the lungs every 4 (four) hours as needed., Disp: 1 Inhaler, Rfl: 5 .  ALPRAZolam (XANAX) 0.5 MG tablet, Take 0.5 mg by mouth 3 (three) times daily as needed., Disp: , Rfl: 0 .  aspirin EC 81 MG tablet, Take 1 tablet (81 mg total) by mouth daily., Disp: 90 tablet, Rfl: 3 .  atenolol (TENORMIN) 50 MG tablet, Take 1 tablet (50 mg total) by mouth every morning., Disp: 90 tablet, Rfl: 1 .  DULoxetine (CYMBALTA) 60 MG capsule, TAKE 1 BY MOUTH TWICE DAILY, Disp: 180 capsule, Rfl: 1 .  ELMIRON 100 MG capsule, Take 1 capsule by mouth 3 (three) times  daily as needed. When cystitis symptoms arises., Disp: , Rfl:  .  estradiol (ESTRACE) 0.1 MG/GM vaginal cream, Place 1 Applicatorful vaginally 2 (two) times a week. ALTERNATES WITH ESTRADIAL TABLET, Disp: , Rfl:  .  fluticasone (FLONASE) 50 MCG/ACT nasal spray, Place 1 spray into the nose as needed for allergies. , Disp: , Rfl:  .  furosemide (LASIX) 20 MG tablet, Take 1 tablet (20 mg total) by mouth every morning., Disp: 180 tablet, Rfl: 1 .  gabapentin (NEURONTIN) 300 MG capsule, Take 1 capsule (300 mg total) by mouth 2 (two) times daily., Disp: 60 capsule, Rfl: 6 .  hydrocortisone-pramoxine (ANALPRAM HC) 2.5-1 % rectal cream, Place 1 application rectally 3 (three) times daily., Disp: 30 g, Rfl: 0 .  loratadine (CLARITIN) 10 MG tablet, Take 1 tablet (10 mg total) by mouth daily., Disp: 30 tablet, Rfl: 11 .  omeprazole (PRILOSEC) 40 MG capsule, Take 40 mg by mouth 2 (two) times daily., Disp: , Rfl:  .  potassium chloride SA (K-DUR,KLOR-CON) 20 MEQ tablet, Take 1 tablet (20 mEq total) by mouth daily., Disp: , Rfl:  .  Probiotic Product (ACIDOPHILUS PROBIOTIC COMPLEX PO), Take 1 capsule by mouth daily., Disp: , Rfl:  .  traMADol (ULTRAM) 50 MG tablet, Take  1-2 tablets (50-100 mg total) by mouth 3 (three) times daily as needed for moderate pain., Disp: 180 tablet, Rfl: 0 .  traZODone (DESYREL) 100 MG tablet, Take 1 tablet (100 mg total) by mouth at bedtime., Disp: 180 tablet, Rfl: 1 .  URELLE (URELLE/URISED) 81 MG TABS tablet, Take 1 tablet (81 mg total) by mouth 3 (three) times daily. (Patient taking differently: Take 1 tablet by mouth 3 (three) times daily as needed for bladder spasms. ), Disp: 30 each, Rfl: 2 .  doxycycline (VIBRAMYCIN) 100 MG capsule, Take 1 capsule (100 mg total) by mouth 2 (two) times daily., Disp: 20 capsule, Rfl: 0 No current facility-administered medications for this visit.  Facility-Administered Medications Ordered in Other Visits:  .  bupivacaine (MARCAINE) 0.5 % 10 mL,  triamcinolone acetonide (KENALOG-40) 40 mg injection, , Subcutaneous, Once, Carolan Clines, MD .  bupivacaine (MARCAINE) 0.5 % 15 mL, phenazopyridine (PYRIDIUM) 400 mg bladder mixture, , Bladder Instillation, Once, Carolan Clines, MD Allergies  Allergen Reactions  . Clindamycin Hcl Shortness Of Breath and Rash  . Penicillins Anaphylaxis  . Prednisone Shortness Of Breath and Rash  . Codeine Hives and Other (See Comments)    headache  . Fluzone [Flu Virus Vaccine] Other (See Comments)    Local reaction at the site  . Latex Hives  . Pentazocine Lactate Other (See Comments)    HALLUCINATION  . Pneumococcal Vaccine Polyvalent Hives, Swelling and Other (See Comments)    REACTION: redness, swelling, and hives at injection site  . Tamoxifen Nausea And Vomiting and Other (See Comments)    HEADACHE   Family History  Problem Relation Age of Onset  . Rectal cancer Mother   . Cancer Mother     rectal,  breast, pancreatic  . Irritable bowel syndrome Son   . Irritable bowel syndrome Daughter   . Heart disease Other   . Arthritis Other   . Cancer Other     breast cancer 1st degree relative <50, lung  . Diabetes Other     1st degree relative   . Hyperlipidemia Other   . Hypertension Other   . Osteoporosis Other   . Coronary artery disease Other 65  . Cancer Maternal Aunt     breast   History   Social History  . Marital Status: Married    Spouse Name: N/A  . Number of Children: 3  . Years of Education: N/A   Occupational History  . Retired Therapist, sports    Social History Main Topics  . Smoking status: Former Smoker -- 1.00 packs/day for 15 years    Types: Cigarettes    Quit date: 05/27/2012  . Smokeless tobacco: Never Used  . Alcohol Use: No     Comment: occasional  . Drug Use: No  . Sexual Activity: Not on file   Other Topics Concern  . None   Social History Narrative   Exercise-- 2days a week [employer],  [employer], walking     Review of Systems: See HPI.  All other  ROS are negative.  Physical Examination: BP 113/53 mmHg  Pulse 80  Temp(Src) 98.7 F (37.1 C) (Oral)  Ht 5\' 5"  (1.651 m)  Wt 117 lb 3.2 oz (53.162 kg)  BMI 19.50 kg/m2  SpO2 100%  General appearance: alert, cooperative and appears stated age Head: Normocephalic, without obvious abnormality, atraumatic, sinuses tender to percussion Eyes: conjunctivae/corneas clear. PERRL, EOM's intact. Fundi benign. Ears: normal TM's and external ear canals both ears Nose: moderate congestion, turbinates swollen, sinus  tenderness bilateral Throat: lips, mucosa, and tongue normal; teeth and gums normal Neck: no adenopathy, no carotid bruit, no JVD, supple, symmetrical, trachea midline and thyroid not enlarged, symmetric, no tenderness/mass/nodules Lungs: clear to auscultation bilaterally Chest wall: no tenderness   Assessment/Plan: Acute bacterial sinusitis Rx Doxycycline.  Increase fluids.  Rest.  Saline nasal spray.  Probiotic.  Mucinex as directed.  Humidifier in bedroom. Continue allergy medications.  Call or return to clinic if symptoms are not improving.

## 2015-06-15 NOTE — Progress Notes (Signed)
Pre visit review using our clinic review tool, if applicable. No additional management support is needed unless otherwise documented below in the visit note. 

## 2015-06-15 NOTE — Patient Instructions (Signed)
Please take antibiotic as directed.  Increase fluid intake.  Use Saline nasal spray.  Take a daily multivitamin. Continue your Mucinex as directed.  Place a humidifier in the bedroom. Rest your voice. Please call or return clinic if symptoms are not improving.  Sinusitis Sinusitis is redness, soreness, and swelling (inflammation) of the paranasal sinuses. Paranasal sinuses are air pockets within the bones of your face (beneath the eyes, the middle of the forehead, or above the eyes). In healthy paranasal sinuses, mucus is able to drain out, and air is able to circulate through them by way of your nose. However, when your paranasal sinuses are inflamed, mucus and air can become trapped. This can allow bacteria and other germs to grow and cause infection. Sinusitis can develop quickly and last only a short time (acute) or continue over a long period (chronic). Sinusitis that lasts for more than 12 weeks is considered chronic.  CAUSES  Causes of sinusitis include:  Allergies.  Structural abnormalities, such as displacement of the cartilage that separates your nostrils (deviated septum), which can decrease the air flow through your nose and sinuses and affect sinus drainage.  Functional abnormalities, such as when the small hairs (cilia) that line your sinuses and help remove mucus do not work properly or are not present. SYMPTOMS  Symptoms of acute and chronic sinusitis are the same. The primary symptoms are pain and pressure around the affected sinuses. Other symptoms include:  Upper toothache.  Earache.  Headache.  Bad breath.  Decreased sense of smell and taste.  A cough, which worsens when you are lying flat.  Fatigue.  Fever.  Thick drainage from your nose, which often is green and may contain pus (purulent).  Swelling and warmth over the affected sinuses. DIAGNOSIS  Your caregiver will perform a physical exam. During the exam, your caregiver may:  Look in your nose for signs  of abnormal growths in your nostrils (nasal polyps).  Tap over the affected sinus to check for signs of infection.  View the inside of your sinuses (endoscopy) with a special imaging device with a light attached (endoscope), which is inserted into your sinuses. If your caregiver suspects that you have chronic sinusitis, one or more of the following tests may be recommended:  Allergy tests.  Nasal culture A sample of mucus is taken from your nose and sent to a lab and screened for bacteria.  Nasal cytology A sample of mucus is taken from your nose and examined by your caregiver to determine if your sinusitis is related to an allergy. TREATMENT  Most cases of acute sinusitis are related to a viral infection and will resolve on their own within 10 days. Sometimes medicines are prescribed to help relieve symptoms (pain medicine, decongestants, nasal steroid sprays, or saline sprays).  However, for sinusitis related to a bacterial infection, your caregiver will prescribe antibiotic medicines. These are medicines that will help kill the bacteria causing the infection.  Rarely, sinusitis is caused by a fungal infection. In theses cases, your caregiver will prescribe antifungal medicine. For some cases of chronic sinusitis, surgery is needed. Generally, these are cases in which sinusitis recurs more than 3 times per year, despite other treatments. HOME CARE INSTRUCTIONS   Drink plenty of water. Water helps thin the mucus so your sinuses can drain more easily.  Use a humidifier.  Inhale steam 3 to 4 times a day (for example, sit in the bathroom with the shower running).  Apply a warm, moist washcloth to your  face 3 to 4 times a day, or as directed by your caregiver.  Use saline nasal sprays to help moisten and clean your sinuses.  Take over-the-counter or prescription medicines for pain, discomfort, or fever only as directed by your caregiver. SEEK IMMEDIATE MEDICAL CARE IF:  You have  increasing pain or severe headaches.  You have nausea, vomiting, or drowsiness.  You have swelling around your face.  You have vision problems.  You have a stiff neck.  You have difficulty breathing. MAKE SURE YOU:   Understand these instructions.  Will watch your condition.  Will get help right away if you are not doing well or get worse. Document Released: 12/11/2005 Document Revised: 03/04/2012 Document Reviewed: 12/26/2011 [facility] Patient Information 2014 [place], [place].

## 2015-06-25 ENCOUNTER — Ambulatory Visit (INDEPENDENT_AMBULATORY_CARE_PROVIDER_SITE_OTHER): Payer: Medicare Other | Admitting: Physical Therapy

## 2015-06-25 DIAGNOSIS — R29898 Other symptoms and signs involving the musculoskeletal system: Secondary | ICD-10-CM

## 2015-06-25 DIAGNOSIS — M545 Low back pain: Secondary | ICD-10-CM | POA: Diagnosis not present

## 2015-06-25 NOTE — Patient Instructions (Signed)
Knee to Chest   Lying supine, bend involved knee to chest _2-3__ times. Hold for 20-30 seconds. Repeat with other leg. Do _1-3__ times per day.  Copyright  VHI. All rights reserved.   Lower Trunk Rotation Stretch   Keeping back flat and feet together, rotate knees to left side. Hold _20-30___ seconds. Repeat __2-3__ times per set. Do _1___ sets per session. Do __1-3__ sessions per day.  http://orth.exer.us/123   Copyright  VHI. All rights reserved.    [facility] [address] [place], [place] [address]  [phone] (office) [phone] (fax)

## 2015-06-25 NOTE — Therapy (Signed)
[place], [place], [address] Phone: [phone]   Fax:  [phone]  Physical Therapy Evaluation  Patient Details  Name: [name] MRN: [id number] Date of Birth: 1946/05/20 Referring Provider:  Jovita Gamma, MD  Encounter Date: 06/25/2015      PT End of Session - 06/25/15 1303    Visit Number 1   Number of Visits 12   Date for PT Re-Evaluation 08/06/15   PT Start Time 3810   PT Stop Time 1233   PT Time Calculation (min) 48 min   Activity Tolerance Patient tolerated treatment well   Behavior During Therapy [facility] for tasks assessed/performed      Past Medical History  Diagnosis Date  . GERD (gastroesophageal reflux disease)   . Arthritis   . Asthma   . Hypertension   . History of colonic polyps     BENIGN  . Fibromyalgia   . S/P radiation therapy 11/12/12 - 12/05/12    right Breast  . CAD (coronary artery disease) CARDIOLOGIST--  DR MCALHANY    mild non-obstructive cad  . COPD (chronic obstructive pulmonary disease)   . History of breast cancer ONCOLOGIST-- DR Jana Hakim---  NO RECURRANCE    DX 07/2012;  LOW GRADE DCIS  ER+PR+  ----  S/P RIGHT LUMPECTOMY WITH NEGATIVE MARGINS/   RADIATION ENDED 11/2012  . History of chronic bronchitis   . Pelvic pain   . Frequency of urination   . Urgency of urination   . Chronic constipation   . History of tachycardia     CONTROLLED  WITH ATENOLOL  . History of basal cell carcinoma excision     X2  . Sinus headache   . H/O hiatal hernia     Past Surgical History  Procedure Laterality Date  . Nasal sinus surgery  1985  . Cardiovascular stress test  06-18-2012  DR McALHANY    LOW RISK NUCLEAR STUDY/  SMALL FIXED AREA OF MODERATELY DECREASED UPTAKE IN ANTEROSEPTAL WALL WHICH MAY BE ARTIFACTUAL/  NO ISCHEMIA/  EF 68%  . Transthoracic echocardiogram  06-24-2012    GRADE I DIASTOLIC DYSFUNCTION/  EF 55-60%/  MILD MR  . Right breast bx  08-23-2012   . Removal vocal cord cyst  FEB 2014  . Cardiac catheterization  09-13-2007  DR Lia Foyer    WELL-PRESERVED LVF/  DIFFUSE SCATTERED CORONARY CALCIFACATION AND ATHEROSCLEROSIS WITHOUT OBSTRUCTION  . Cardiac catheterization  08-04-2010  DR [facility]    NON-OBSTRUCTIVE CAD/  pLAD 40%/  oLAD 30%/  mLAD 30%/  pRCA 30%/  EF 60%  . Colonoscopy  09-29-2010  . Breast lumpectomy Right 10-11-2012    W/ SLN BX  . Total abdominal hysterectomy w/ bilateral salpingoophorectomy  1982    W/  APPENDECTOMY  . Tonsillectomy and adenoidectomy  AGE 81  . Orif right ankle  fx  2006  . Right hand surgery  X3  LAST ONE 2009    INCLUDES  ORIF RIGHT 5TH FINGER AND REVISION TWICE  . Cystoscopy with hydrodistension and biopsy N/A 03/06/2014    Procedure: CYSTOSCOPY/HYDRODISTENSION/ INSTILATION OF MARCAINE AND PYRIDIUM;  Surgeon: Ailene Rud, MD;  Location: [facility];  Service: Urology;  Laterality: N/A;    There were no vitals filed for this visit.  Visit Diagnosis:  Bilateral low back pain, with sciatica presence unspecified - Plan: PT plan of care cert/re-cert  Weakness of both lower extremities - Plan: PT plan of care cert/re-cert  Subjective Assessment - 06/25/15 1149    Subjective Pt is a 69 y/o female who presents to OPPT with 30-40 year history of back and neck pain.  Pt reports pain in low back and pelvis with radiating hip and knee pain.     Pertinent History hx breast cancer, 2014: sepsis   Limitations Standing;Sitting;Walking   How long can you sit comfortably? 1 hour   How long can you walk comfortably? 3-4 blocks (20 min)   Diagnostic tests xrays: L4-5 narrowing joint space; protrusion / MRI: Small focal soft disc protrusion into the left lateral recess at L 4-5 with a mass effect upon the left L5 nerve rootlets within the thecal sac.   Patient Stated Goals improve pain; learn exercises to perform   Currently in Pain? Yes   Pain Score 4    Pain Location Back   Pain  Orientation Right;Left  R>L   Pain Descriptors / Indicators Stabbing   Pain Type Chronic pain   Pain Radiating Towards hips and knees   Pain Onset More than a month ago   Pain Frequency Intermittent   Aggravating Factors  prolonged sitting; standing and walking, bending forward (vacuuming/ironing)   Pain Relieving Factors tylenol, gabapentin, tramadol, repositioning            [facility] PT Assessment - 06/25/15 1159    Assessment   Medical Diagnosis low back pain with radiculopathy   Onset Date/Surgical Date --  2-3 years with exacerbation a couple months ago   Next MD Visit 07/11/15   Prior Therapy PT for neck   Precautions   Precaution Comments no heavy lifting   Restrictions   Weight Bearing Restrictions No   Balance Screen   Has the patient fallen in the past 6 months Yes   How many times? 6: 1-2 times per month   Has the patient had a decrease in activity level because of a fear of falling?  Yes   Is the patient reluctant to leave their home because of a fear of falling?  No   Home Ecologist residence   Living Arrangements Spouse/significant other   Available Help at Discharge Family   Type of [place] to enter   Entrance Stairs-Number of Steps 2   Entrance Stairs-Rails Can reach both;Right;Left   Home Layout Two level;Bed/bath upstairs;1/2 bath on main level   Alternate Level Stairs-Number of Steps 16   Alternate Level Stairs-Rails Left   Prior Function   Level of Independence Independent   Vocation Retired   [employer] Retired Therapist, sports   Leisure dancing; gardening, reading   Observation/Other Assessments   Focus on Therapeutic Outcomes (FOTO)  38 (62% limited; predicted 46% limited)   Posture/Postural Control   Posture/Postural Control Postural limitations   Postural Limitations Rounded Shoulders;Decreased lumbar lordosis;Right pelvic obliquity   AROM   AROM Assessment Site Lumbar   Lumbar Flexion 87   with pain   Lumbar Extension 19  with pain   Lumbar - Right Side Bend 26   Lumbar - Left Side Bend 27   Lumbar - Right Rotation WNL   Lumbar - Left Rotation WNL   Strength   Strength Assessment Site Hip;Knee;Ankle   Right Hip Flexion 3+/5   Right Hip Extension 3/5   Right Hip ABduction 3/5   Right Hip ADduction 3/5   Left Hip Flexion 3+/5   Left Hip Extension 3/5   Left Hip ABduction 3/5  Left Hip ADduction 3/5   Right/Left Knee Right;Left   Right Knee Flexion 3+/5   Right Knee Extension 4/5   Left Knee Flexion 3+/5   Left Knee Extension 4/5   Right Ankle Dorsiflexion 4/5   Left Ankle Dorsiflexion 4/5   Flexibility   Soft Tissue Assessment /Muscle Length yes   Hamstrings bil tightness   Palpation   Palpation comment significant tenderness to palpation from iliac crest medially to lumbar spine and distally to posterior knee bilaterally.  Pt very tender at SIJ bil and unable to tolerate anything more than light pressure on lumbar spine.   Special Tests    Special Tests Lumbar;Sacrolliac Tests   Lumbar Tests Straight Leg Raise   Sacroiliac Tests  Pelvic Compression   Straight Leg Raise   Findings Negative   Comment tightness bil   Pelvic Dictraction   Findings Negative   Pelvic Compression   Findings Positive   comment bil; c/o increased SI pain                   [facility] Adult PT Treatment/Exercise - 06/25/15 1159    Lumbar Exercises: Stretches   Single Knee to Chest Stretch 2 reps;20 seconds   Single Knee to Chest Stretch Limitations bil   Lower Trunk Rotation 2 reps;20 seconds   Lower Trunk Rotation Limitations bil                PT Education - 06/25/15 1302    Education provided Yes   Education Details HEP for gentle lower trunk ROM and stretching   Person(s) Educated Patient   Methods Explanation;Demonstration;Handout   Comprehension Verbalized understanding;Returned demonstration;Need further instruction             PT Long Term  Goals - 06/25/15 1306    PT LONG TERM GOAL #1   Title independent with HEP (08/06/15)   Time 6   Period Weeks   Status New   PT LONG TERM GOAL #2   Title perform lumbar ROM without increase in pain for improved mobility (08/06/15)   Time 6   Period Weeks   Status New   PT LONG TERM GOAL #3   Title improve lower extremity strength to at least 4/5 for improved function and stability (08/06/15)   Time 6   Period Weeks   Status New   PT LONG TERM GOAL #4   Title verbalize understanding of posture/body mechanics to decrease risk of reinjury (08/06/15)   Time 6   Period Weeks   Status New   PT LONG TERM GOAL #5   Title FOTO score improved to < 50% limited (08/06/15)   Time 6   Period Weeks   Status New               Plan - 06/25/15 1304    Clinical Impression Statement Pt presents to OPPT with low back pain radiating to bil knees.  Pt demonstrates significant tenderness to palpation from iliac crest bil to posterior knees.  Demonstrates decreased strength and muscle tightness bil.  Will benefit from PT to maximize function and decrease pain.   Pt will benefit from skilled therapeutic intervention in order to improve on the following deficits Pain;Decreased strength;Postural dysfunction;Impaired flexibility;Decreased balance;Decreased range of motion;Difficulty walking   Rehab Potential Good   PT Frequency 2x / week   PT Duration 6 weeks   PT Treatment/Interventions ADLs/Self Care Home Management;Cryotherapy;Gait training;Stair training;Traction;Moist Heat;Functional mobility training;Therapeutic activities;Therapeutic exercise;Balance training;Neuromuscular re-education;Manual techniques;Patient/family education;Passive range of  motion;Taping   PT Next Visit Plan review HEP and add hip/core strengthening and stretching   Consulted and Agree with Plan of Care Patient         Problem List Patient Active Problem List   Diagnosis Date Noted  . Acute bacterial sinusitis  06/15/2015  . Left hip pain 04/30/2015  . Sciatica 04/30/2015  . Knee pain, right 04/30/2015  . Leukocytes in urine 04/30/2015  . Hyperlipidemia 03/01/2015  . Bilateral hand numbness 03/01/2015  . Pulmonary nodules 02/12/2015  . External hemorrhoids 02/05/2015  . Vaginitis and vulvovaginitis 02/05/2015  . Chronic night sweats 01/08/2015  . Atypical chest pain 01/01/2015  . Chest pain 01/01/2015  . Pneumonia 01/01/2015  . Renal insufficiency 07/16/2014  . Sepsis secondary to UTI 02/05/2014  . Grief reaction 12/29/2013  . Urinary hesitancy 12/04/2013  . Abdominal pain, other specified site 10/26/2013  . Headache(784.0) 10/13/2013  . Pain, joint, multiple sites 08/18/2013  . Vasculitis 07/29/2013  . ANA positive 07/29/2013  . Other and unspecified hyperlipidemia 07/12/2013  . Contusion, chest wall 04/30/2013  . Malignant neoplasm of lower-outer quadrant of female breast 01/03/2013  . Breast cancer, right breast 10/17/2012  . Splenic lesion 09/02/2012  . Asthma, allergic 08/05/2012  . GERD (gastroesophageal reflux disease) 06/03/2012  . CAD (coronary artery disease) 06/03/2012  . Respiratory failure with hypoxia 06/03/2012  . Former heavy tobacco smoker 06/03/2012  . Edema 04/08/2012  . Stricture and stenosis of esophagus 10/19/2011  . Constipation - functional 07/21/2011  . Nonspecific (abnormal) findings on radiological and other examination of biliary tract 07/21/2011  . Acute UTI 06/16/2011  . Osteoporosis 04/17/2011  . SINUSITIS - ACUTE-NOS 02/24/2011  . POSTMENOPAUSAL STATUS 01/27/2011  . CONSTIPATION 11/02/2010  . FIBROCYSTIC BREAST DISEASE, HX OF 10/18/2010  . CAD, NATIVE VESSEL 08/31/2010  . Essential hypertension 08/25/2010  . CAROTID ARTERY DISEASE 08/25/2010  . TOBACCO ABUSE, HX OF 08/25/2010  . GENERALIZED ANXIETY DISORDER 08/03/2010  . FIBROMYALGIA 01/11/2010  . SKIN CANCER, HX OF 01/11/2010   Laureen Abrahams, PT, DPT 06/25/2015 1:11 PM  [facility] [place], [place], [address] Phone: [phone]   Fax:  [phone]

## 2015-06-29 DIAGNOSIS — R0602 Shortness of breath: Secondary | ICD-10-CM

## 2015-06-29 DIAGNOSIS — R0600 Dyspnea, unspecified: Secondary | ICD-10-CM | POA: Insufficient documentation

## 2015-07-02 ENCOUNTER — Ambulatory Visit (INDEPENDENT_AMBULATORY_CARE_PROVIDER_SITE_OTHER): Payer: Medicare Other | Admitting: Physical Therapy

## 2015-07-02 DIAGNOSIS — R29898 Other symptoms and signs involving the musculoskeletal system: Secondary | ICD-10-CM | POA: Diagnosis not present

## 2015-07-02 DIAGNOSIS — M545 Low back pain: Secondary | ICD-10-CM | POA: Diagnosis not present

## 2015-07-02 NOTE — Patient Instructions (Signed)
  PELVIC TILT      Lie with hips and knees bent. Slowly inhale, and then exhale. Pull navel toward spine and tighten pelvic floor. Hold for __5_ seconds. Continue to breathe in and out during hold. Rest for _10__ seconds. Repeat __10_ times. Do __1-2_ times a day.   Copyright  VHI. All rights reserved.    Knee Drop   Keep pelvis stable. Without rotating hips, slowly drop knee to side, pause, return to center, bring knee across midline toward opposite hip. Feel obliques engaging. Repeat for ___10_ times each leg.    Bridging   Slowly raise buttocks from floor, keeping stomach tight.  Hold for 5 seconds. Repeat __10__ times per set. Do _1___ sets per session. Do __1-2__ sessions per day.  http://orth.exer.us/1097   Copyright  VHI. All rights reserved.

## 2015-07-02 NOTE — Therapy (Signed)
[place], [place], [address] Phone: [phone]   Fax:  [phone]  Physical Therapy Treatment  Patient Details  Name: [name] MRN: [id number] Date of Birth: Jun 26, 1946 Referring Provider:  Jovita Gamma, MD  Encounter Date: 07/02/2015      PT End of Session - 07/02/15 1235    Visit Number 2   Number of Visits 12   Date for PT Re-Evaluation 08/06/15   PT Start Time 6761   PT Stop Time 1227   PT Time Calculation (min) 39 min   Activity Tolerance Patient tolerated treatment well   Behavior During Therapy [facility] for tasks assessed/performed      Past Medical History  Diagnosis Date  . GERD (gastroesophageal reflux disease)   . Arthritis   . Asthma   . Hypertension   . History of colonic polyps     BENIGN  . Fibromyalgia   . S/P radiation therapy 11/12/12 - 12/05/12    right Breast  . CAD (coronary artery disease) CARDIOLOGIST--  DR MCALHANY    mild non-obstructive cad  . COPD (chronic obstructive pulmonary disease)   . History of breast cancer ONCOLOGIST-- DR Jana Hakim---  NO RECURRANCE    DX 07/2012;  LOW GRADE DCIS  ER+PR+  ----  S/P RIGHT LUMPECTOMY WITH NEGATIVE MARGINS/   RADIATION ENDED 11/2012  . History of chronic bronchitis   . Pelvic pain   . Frequency of urination   . Urgency of urination   . Chronic constipation   . History of tachycardia     CONTROLLED  WITH ATENOLOL  . History of basal cell carcinoma excision     X2  . Sinus headache   . H/O hiatal hernia     Past Surgical History  Procedure Laterality Date  . Nasal sinus surgery  1985  . Cardiovascular stress test  06-18-2012  DR McALHANY    LOW RISK NUCLEAR STUDY/  SMALL FIXED AREA OF MODERATELY DECREASED UPTAKE IN ANTEROSEPTAL WALL WHICH MAY BE ARTIFACTUAL/  NO ISCHEMIA/  EF 68%  . Transthoracic echocardiogram  06-24-2012    GRADE I DIASTOLIC DYSFUNCTION/  EF 55-60%/  MILD MR  . Right breast bx  08-23-2012   . Removal vocal cord cyst  FEB 2014  . Cardiac catheterization  09-13-2007  DR Lia Foyer    WELL-PRESERVED LVF/  DIFFUSE SCATTERED CORONARY CALCIFACATION AND ATHEROSCLEROSIS WITHOUT OBSTRUCTION  . Cardiac catheterization  08-04-2010  DR [facility]    NON-OBSTRUCTIVE CAD/  pLAD 40%/  oLAD 30%/  mLAD 30%/  pRCA 30%/  EF 60%  . Colonoscopy  09-29-2010  . Breast lumpectomy Right 10-11-2012    W/ SLN BX  . Total abdominal hysterectomy w/ bilateral salpingoophorectomy  1982    W/  APPENDECTOMY  . Tonsillectomy and adenoidectomy  AGE 18  . Orif right ankle  fx  2006  . Right hand surgery  X3  LAST ONE 2009    INCLUDES  ORIF RIGHT 5TH FINGER AND REVISION TWICE  . Cystoscopy with hydrodistension and biopsy N/A 03/06/2014    Procedure: CYSTOSCOPY/HYDRODISTENSION/ INSTILATION OF MARCAINE AND PYRIDIUM;  Surgeon: Ailene Rud, MD;  Location: [facility];  Service: Urology;  Laterality: N/A;    There were no vitals filed for this visit.  Visit Diagnosis:  Bilateral low back pain, with sciatica presence unspecified  Weakness of both lower extremities      Subjective Assessment - 07/02/15 1148    Subjective back is feeling  okay, had pain this AM when bending forward.  neck is hurting more today (was pulled by larger man giving pt a hug)   Patient Stated Goals improve pain; learn exercises to perform   Currently in Pain? Yes   Pain Score 3    Pain Location Back   Pain Orientation Right;Left   Multiple Pain Sites Yes   Pain Score 7   Pain Location Neck   Pain Relieving Factors will monitor however will not directly address as it is outside scope of referral                         [facility] Adult PT Treatment/Exercise - 07/02/15 1152    Exercises   Exercises Lumbar   Lumbar Exercises: Stretches   Single Knee to Chest Stretch 30 seconds;2 reps   Single Knee to Chest Stretch Limitations bil   Lower Trunk Rotation 2 reps;30 seconds   Lower Trunk Rotation  Limitations bil   Lumbar Exercises: Aerobic   Stationary Bike NuStep Level 4 x 6 min   Lumbar Exercises: Supine   Ab Set 10 reps;5 seconds   Clam 10 reps   Clam Limitations with ab set   Bridge 10 reps;5 seconds   Bridge Limitations x10 with isometric hip abdct with strap   Other Supine Lumbar Exercises isometric hip extension on green pball 10x5 sec alt                PT Education - 07/02/15 1235    Education provided Yes   Education Details HEP for strengthening   Person(s) Educated Patient   Methods Explanation;Demonstration;Handout   Comprehension Verbalized understanding;Returned demonstration;Need further instruction             PT Long Term Goals - 07/02/15 1237    PT LONG TERM GOAL #1   Title independent with HEP (08/06/15)   Status On-going   PT LONG TERM GOAL #2   Title perform lumbar ROM without increase in pain for improved mobility (08/06/15)   Status On-going   PT LONG TERM GOAL #3   Title improve lower extremity strength to at least 4/5 for improved function and stability (08/06/15)   Status On-going   PT LONG TERM GOAL #4   Title verbalize understanding of posture/body mechanics to decrease risk of reinjury (08/06/15)   Status On-going   PT LONG TERM GOAL #5   Title FOTO score improved to < 50% limited (08/06/15)   Status On-going               Plan - 07/02/15 1236    Clinical Impression Statement Added stabilization exercises to HEP and pt tolerated well.  Will continue to benefit from PT to maximize function and decrease pain.     PT Next Visit Plan review updated HEP and progress hip/core strengthening   Consulted and Agree with Plan of Care Patient        Problem List Patient Active Problem List   Diagnosis Date Noted  . Shortness of breath   . Acute bacterial sinusitis 06/15/2015  . Left hip pain 04/30/2015  . Sciatica 04/30/2015  . Knee pain, right 04/30/2015  . Leukocytes in urine 04/30/2015  . Hyperlipidemia 03/01/2015   . Bilateral hand numbness 03/01/2015  . Pulmonary nodules 02/12/2015  . External hemorrhoids 02/05/2015  . Vaginitis and vulvovaginitis 02/05/2015  . Chronic night sweats 01/08/2015  . Atypical chest pain 01/01/2015  . Chest pain 01/01/2015  . Pneumonia 01/01/2015  .  Renal insufficiency 07/16/2014  . Sepsis secondary to UTI 02/05/2014  . Grief reaction 12/29/2013  . Urinary hesitancy 12/04/2013  . Abdominal pain, other specified site 10/26/2013  . Headache(784.0) 10/13/2013  . Pain, joint, multiple sites 08/18/2013  . Vasculitis 07/29/2013  . ANA positive 07/29/2013  . Other and unspecified hyperlipidemia 07/12/2013  . Contusion, chest wall 04/30/2013  . Malignant neoplasm of lower-outer quadrant of female breast 01/03/2013  . Breast cancer, right breast 10/17/2012  . Splenic lesion 09/02/2012  . Asthma, allergic 08/05/2012  . GERD (gastroesophageal reflux disease) 06/03/2012  . CAD (coronary artery disease) 06/03/2012  . Respiratory failure with hypoxia 06/03/2012  . Former heavy tobacco smoker 06/03/2012  . Edema 04/08/2012  . Stricture and stenosis of esophagus 10/19/2011  . Constipation - functional 07/21/2011  . Nonspecific (abnormal) findings on radiological and other examination of biliary tract 07/21/2011  . Acute UTI 06/16/2011  . Osteoporosis 04/17/2011  . SINUSITIS - ACUTE-NOS 02/24/2011  . POSTMENOPAUSAL STATUS 01/27/2011  . CONSTIPATION 11/02/2010  . FIBROCYSTIC BREAST DISEASE, HX OF 10/18/2010  . CAD, NATIVE VESSEL 08/31/2010  . Essential hypertension 08/25/2010  . CAROTID ARTERY DISEASE 08/25/2010  . TOBACCO ABUSE, HX OF 08/25/2010  . GENERALIZED ANXIETY DISORDER 08/03/2010  . FIBROMYALGIA 01/11/2010  . SKIN CANCER, HX OF 01/11/2010   Laureen Abrahams, PT, DPT 07/02/2015 12:39 PM  [facility] [place], [place], [address] Phone: [phone]   Fax:  [phone]

## 2015-07-09 ENCOUNTER — Encounter: Payer: Self-pay | Admitting: Rehabilitative and Restorative Service Providers"

## 2015-07-09 ENCOUNTER — Ambulatory Visit (INDEPENDENT_AMBULATORY_CARE_PROVIDER_SITE_OTHER): Payer: Medicare Other | Admitting: Rehabilitative and Restorative Service Providers"

## 2015-07-09 DIAGNOSIS — R29898 Other symptoms and signs involving the musculoskeletal system: Secondary | ICD-10-CM | POA: Diagnosis not present

## 2015-07-09 DIAGNOSIS — M545 Low back pain: Secondary | ICD-10-CM

## 2015-07-09 NOTE — Therapy (Signed)
[place], [place], [address] Phone: [phone]   Fax:  [phone]  Physical Therapy Treatment  Patient Details  Name: [name] MRN: [id number] Date of Birth: [birth year] Referring Provider:  Jovita Gamma, MD  Encounter Date: 07/09/2015      PT End of Session - 07/09/15 1105    Visit Number 3   Number of Visits 12   Date for PT Re-Evaluation 08/06/15   PT Start Time 1024   PT Stop Time 1110   PT Time Calculation (min) 46 min   Activity Tolerance Patient tolerated treatment well   Behavior During Therapy [facility] for tasks assessed/performed      Past Medical History  Diagnosis Date  . GERD (gastroesophageal reflux disease)   . Arthritis   . Asthma   . Hypertension   . History of colonic polyps     BENIGN  . Fibromyalgia   . S/P radiation therapy 11/12/12 - 12/05/12    right Breast  . CAD (coronary artery disease) CARDIOLOGIST--  DR MCALHANY    mild non-obstructive cad  . COPD (chronic obstructive pulmonary disease)   . History of breast cancer ONCOLOGIST-- DR Jana Hakim---  NO RECURRANCE    DX 07/2012;  LOW GRADE DCIS  ER+PR+  ----  S/P RIGHT LUMPECTOMY WITH NEGATIVE MARGINS/   RADIATION ENDED 11/2012  . History of chronic bronchitis   . Pelvic pain   . Frequency of urination   . Urgency of urination   . Chronic constipation   . History of tachycardia     CONTROLLED  WITH ATENOLOL  . History of basal cell carcinoma excision     X2  . Sinus headache   . H/O hiatal hernia     Past Surgical History  Procedure Laterality Date  . Nasal sinus surgery  1985  . Cardiovascular stress test  06-18-2012  DR McALHANY    LOW RISK NUCLEAR STUDY/  SMALL FIXED AREA OF MODERATELY DECREASED UPTAKE IN ANTEROSEPTAL WALL WHICH MAY BE ARTIFACTUAL/  NO ISCHEMIA/  EF 68%  . Transthoracic echocardiogram  06-24-2012    GRADE I DIASTOLIC DYSFUNCTION/  EF 55-60%/  MILD MR  . Right breast bx  08-23-2012   . Removal vocal cord cyst  FEB 2014  . Cardiac catheterization  09-13-2007  DR Lia Foyer    WELL-PRESERVED LVF/  DIFFUSE SCATTERED CORONARY CALCIFACATION AND ATHEROSCLEROSIS WITHOUT OBSTRUCTION  . Cardiac catheterization  08-04-2010  DR [facility]    NON-OBSTRUCTIVE CAD/  pLAD 40%/  oLAD 30%/  mLAD 30%/  pRCA 30%/  EF 60%  . Colonoscopy  09-29-2010  . Breast lumpectomy Right 10-11-2012    W/ SLN BX  . Total abdominal hysterectomy w/ bilateral salpingoophorectomy  1982    W/  APPENDECTOMY  . Tonsillectomy and adenoidectomy  AGE 38  . Orif right ankle  fx  2006  . Right hand surgery  X3  LAST ONE 2009    INCLUDES  ORIF RIGHT 5TH FINGER AND REVISION TWICE  . Cystoscopy with hydrodistension and biopsy N/A 03/06/2014    Procedure: CYSTOSCOPY/HYDRODISTENSION/ INSTILATION OF MARCAINE AND PYRIDIUM;  Surgeon: Ailene Rud, MD;  Location: [facility];  Service: Urology;  Laterality: N/A;    There were no vitals filed for this visit.  Visit Diagnosis:  Bilateral low back pain, with sciatica presence unspecified  Weakness of both lower extremities      Subjective Assessment - 07/09/15 1028    Subjective back is doing  so much better; has relaxed; can do more stuff at home; can pick up things from the floor. Has a sinus infection. Has catarac surgery nest Thursday 07/15/15   Currently in Pain? No/denies            [facility] Adult PT Treatment/Exercise - 07/09/15 0001    Lumbar Exercises: Stretches   Single Knee to Chest Stretch 30 seconds;2 reps   Single Knee to Chest Stretch Limitations bil   Double Knee to Chest Stretch 30 seconds   Lower Trunk Rotation 2 reps;30 seconds   Lower Trunk Rotation Limitations bil   Lumbar Exercises: Aerobic   Stationary Bike NuStep Level 4 x 6 min   Lumbar Exercises: Standing   Wall Slides 10 reps;5 seconds   Wall Slides Limitations shallow bend   Lumbar Exercises: Supine   Ab Set 5 reps  10 sec 3 part core   Clam 10 reps   Clam  Limitations with ab set   Bridge 10 reps;5 seconds   Bridge Limitations x10 with isometric hip abdct with strap           PT Education - 07/09/15 1102    Education provided Yes   Education Details core stabilization; 3 part core; exercise   Person(s) Educated Patient   Methods Explanation;Demonstration;Tactile cues;Verbal cues;Handout   Comprehension Verbalized understanding;Returned demonstration;Verbal cues required;Tactile cues required;Need further instruction             PT Long Term Goals - 07/02/15 1237    PT LONG TERM GOAL #1   Title independent with HEP (08/06/15)   Status On-going   PT LONG TERM GOAL #2   Title perform lumbar ROM without increase in pain for improved mobility (08/06/15)   Status On-going   PT LONG TERM GOAL #3   Title improve lower extremity strength to at least 4/5 for improved function and stability (08/06/15)   Status On-going   PT LONG TERM GOAL #4   Title verbalize understanding of posture/body mechanics to decrease risk of reinjury (08/06/15)   Status On-going   PT LONG TERM GOAL #5   Title FOTO score improved to < 50% limited (08/06/15)   Status On-going           Plan - 07/09/15 1110    Clinical Impression Statement Good response to exercise. Less pain and incresaed activity level. Reviewed exercises; added new stabilization exercises and continued spine care education.   Pt will benefit from skilled therapeutic intervention in order to improve on the following deficits Pain;Decreased strength;Postural dysfunction;Impaired flexibility;Decreased balance;Decreased range of motion;Difficulty walking   Rehab Potential Good   PT Frequency 2x / week   PT Duration 6 weeks   PT Treatment/Interventions ADLs/Self Care Home Management;Cryotherapy;Gait training;Stair training;Traction;Moist Heat;Functional mobility training;Therapeutic activities;Therapeutic exercise;Balance training;Neuromuscular re-education;Manual techniques;Patient/family  education;Passive range of motion;Taping   PT Next Visit Plan review updated HEP and progress hip/core strengthening   Consulted and Agree with Plan of Care Patient        Problem List Patient Active Problem List   Diagnosis Date Noted  . Shortness of breath   . Acute bacterial sinusitis 06/15/2015  . Left hip pain 04/30/2015  . Sciatica 04/30/2015  . Knee pain, right 04/30/2015  . Leukocytes in urine 04/30/2015  . Hyperlipidemia 03/01/2015  . Bilateral hand numbness 03/01/2015  . Pulmonary nodules 02/12/2015  . External hemorrhoids 02/05/2015  . Vaginitis and vulvovaginitis 02/05/2015  . Chronic night sweats 01/08/2015  . Atypical chest pain 01/01/2015  . Chest pain 01/01/2015  .  Pneumonia 01/01/2015  . Renal insufficiency 07/16/2014  . Sepsis secondary to UTI 02/05/2014  . Grief reaction 12/29/2013  . Urinary hesitancy 12/04/2013  . Abdominal pain, other specified site 10/26/2013  . Headache(784.0) 10/13/2013  . Pain, joint, multiple sites 08/18/2013  . Vasculitis 07/29/2013  . ANA positive 07/29/2013  . Other and unspecified hyperlipidemia 07/12/2013  . Contusion, chest wall 04/30/2013  . Malignant neoplasm of lower-outer quadrant of female breast 01/03/2013  . Breast cancer, right breast 10/17/2012  . Splenic lesion 09/02/2012  . Asthma, allergic 08/05/2012  . GERD (gastroesophageal reflux disease) 06/03/2012  . CAD (coronary artery disease) 06/03/2012  . Respiratory failure with hypoxia 06/03/2012  . Former heavy tobacco smoker 06/03/2012  . Edema 04/08/2012  . Stricture and stenosis of esophagus 10/19/2011  . Constipation - functional 07/21/2011  . Nonspecific (abnormal) findings on radiological and other examination of biliary tract 07/21/2011  . Acute UTI 06/16/2011  . Osteoporosis 04/17/2011  . SINUSITIS - ACUTE-NOS 02/24/2011  . POSTMENOPAUSAL STATUS 01/27/2011  . CONSTIPATION 11/02/2010  . FIBROCYSTIC BREAST DISEASE, HX OF 10/18/2010  . CAD, NATIVE  VESSEL 08/31/2010  . Essential hypertension 08/25/2010  . CAROTID ARTERY DISEASE 08/25/2010  . TOBACCO ABUSE, HX OF 08/25/2010  . GENERALIZED ANXIETY DISORDER 08/03/2010  . FIBROMYALGIA 01/11/2010  . SKIN CANCER, HX OF 01/11/2010    Zeferino Mounts Nilda Simmer, PT. MPH 07/09/2015, 11:14 AM  [facility] [place], [place], [address] Phone: [phone]   Fax:  [phone]

## 2015-07-09 NOTE — Patient Instructions (Addendum)
Double Knee to Chest (Flexion)   Gently pull both knees toward chest. Feel stretch in lower back or buttock area. Breathing deeply, Hold _30__ seconds. Repeat _3___ times. Do _2-3___ sessions per day.   Can bring knees apart to increase stretchAbdominal Bracing With Pelvic Floor (Hook-Lying)   With neutral spine, tighten pelvic floor, abdominals suck your belly button toward your back bone tighten back at waist.  Hold 10 sec. Repeat _10__ times. Do _several__ times a day. Progress to doing this exercise in sitting and standing.   Back Wall Slide   With feet __8-10__ inches from wall, lean as much of back against the wall as possible. Gently squat down _10-12__ inches, keeping back against wall.  Hold _10___ seconds while counting out loud. Repeat __10 - 20__ times. Do _1-2___ sessions per day.  Resisted External Rotation: in Neutral - Bilateral   PALMS UP!!! Sit or stand, tubing in both hands, elbows at sides, bent to 90, forearms forward. Pinch shoulder blades together and rotate forearms out. Keep elbows at sides. Repeat __10__ times per set. Do _2-3___ sets per session. Do _2-3___ sessions per day.

## 2015-07-14 ENCOUNTER — Encounter: Payer: Medicare Other | Admitting: Rehabilitative and Restorative Service Providers"

## 2015-07-15 DIAGNOSIS — H2589 Other age-related cataract: Secondary | ICD-10-CM | POA: Insufficient documentation

## 2015-07-23 ENCOUNTER — Encounter: Payer: Medicare Other | Admitting: Rehabilitative and Restorative Service Providers"

## 2015-07-23 ENCOUNTER — Encounter: Payer: Self-pay | Admitting: Gastroenterology

## 2015-07-30 ENCOUNTER — Encounter: Payer: Medicare Other | Admitting: Physical Therapy

## 2015-08-06 ENCOUNTER — Encounter: Payer: Self-pay | Admitting: Rehabilitative and Restorative Service Providers"

## 2015-08-06 ENCOUNTER — Ambulatory Visit (INDEPENDENT_AMBULATORY_CARE_PROVIDER_SITE_OTHER): Payer: Medicare Other | Admitting: Rehabilitative and Restorative Service Providers"

## 2015-08-06 DIAGNOSIS — R29898 Other symptoms and signs involving the musculoskeletal system: Secondary | ICD-10-CM | POA: Diagnosis not present

## 2015-08-06 DIAGNOSIS — M545 Low back pain: Secondary | ICD-10-CM | POA: Diagnosis not present

## 2015-08-06 NOTE — Therapy (Signed)
[place], [place], [address] Phone: [phone]   Fax:  [phone]  Physical Therapy Treatment  Patient Details  Name: [name] MRN: [id number] Date of Birth: [birth year] Referring Provider:  Jovita Gamma, MD  Encounter Date: 08/06/2015      PT End of Session - 08/06/15 0937    Visit Number 4   Number of Visits 12   Date for PT Re-Evaluation 08/06/15   PT Start Time 0937   PT Stop Time 1021   PT Time Calculation (min) 44 min   Activity Tolerance Patient tolerated treatment well      Past Medical History  Diagnosis Date  . GERD (gastroesophageal reflux disease)   . Arthritis   . Asthma   . Hypertension   . History of colonic polyps     BENIGN  . Fibromyalgia   . S/P radiation therapy 11/12/12 - 12/05/12    right Breast  . CAD (coronary artery disease) CARDIOLOGIST--  DR MCALHANY    mild non-obstructive cad  . COPD (chronic obstructive pulmonary disease)   . History of breast cancer ONCOLOGIST-- DR Jana Hakim---  NO RECURRANCE    DX 07/2012;  LOW GRADE DCIS  ER+PR+  ----  S/P RIGHT LUMPECTOMY WITH NEGATIVE MARGINS/   RADIATION ENDED 11/2012  . History of chronic bronchitis   . Pelvic pain   . Frequency of urination   . Urgency of urination   . Chronic constipation   . History of tachycardia     CONTROLLED  WITH ATENOLOL  . History of basal cell carcinoma excision     X2  . Sinus headache   . H/O hiatal hernia     Past Surgical History  Procedure Laterality Date  . Nasal sinus surgery  1985  . Cardiovascular stress test  06-18-2012  DR McALHANY    LOW RISK NUCLEAR STUDY/  SMALL FIXED AREA OF MODERATELY DECREASED UPTAKE IN ANTEROSEPTAL WALL WHICH MAY BE ARTIFACTUAL/  NO ISCHEMIA/  EF 68%  . Transthoracic echocardiogram  06-24-2012    GRADE I DIASTOLIC DYSFUNCTION/  EF 55-60%/  MILD MR  . Right breast bx  08-23-2012  . Removal vocal cord cyst  FEB 2014  . Cardiac  catheterization  09-13-2007  DR Lia Foyer    WELL-PRESERVED LVF/  DIFFUSE SCATTERED CORONARY CALCIFACATION AND ATHEROSCLEROSIS WITHOUT OBSTRUCTION  . Cardiac catheterization  08-04-2010  DR [facility]    NON-OBSTRUCTIVE CAD/  pLAD 40%/  oLAD 30%/  mLAD 30%/  pRCA 30%/  EF 60%  . Colonoscopy  09-29-2010  . Breast lumpectomy Right 10-11-2012    W/ SLN BX  . Total abdominal hysterectomy w/ bilateral salpingoophorectomy  1982    W/  APPENDECTOMY  . Tonsillectomy and adenoidectomy  AGE 35  . Orif right ankle  fx  2006  . Right hand surgery  X3  LAST ONE 2009    INCLUDES  ORIF RIGHT 5TH FINGER AND REVISION TWICE  . Cystoscopy with hydrodistension and biopsy N/A 03/06/2014    Procedure: CYSTOSCOPY/HYDRODISTENSION/ INSTILATION OF MARCAINE AND PYRIDIUM;  Surgeon: Ailene Rud, MD;  Location: [facility];  Service: Urology;  Laterality: N/A;    There were no vitals filed for this visit.  Visit Diagnosis:  Bilateral low back pain, with sciatica presence unspecified  Weakness of both lower extremities      Subjective Assessment - 08/06/15 0937    Subjective pt reports that she had difficulty with catarac surgery with increased pressures  in her eye. She has not been able to exercise since eye surgery and can tell her back pain is starting to hurt more again. Has the OK to resume wxercises. Generally just does not feel good - no energy.   How long can you sit comfortably? 1 - 2 hour   How long can you walk comfortably? 1 mile on the treadmill   Diagnostic tests xrays: L4-5 narrowing joint space; protrusion / MRI: Small focal soft disc protrusion into the left lateral recess at L 4-5 with a mass effect upon the left L5 nerve rootlets within the thecal sac.   Patient Stated Goals improve pain; learn exercises to perform   Currently in Pain? No/denies            [facility] PT Assessment - 08/06/15 0001    AROM   Lumbar Flexion finger tips 10 in from floor   Lumbar Extension 25%    Lumbar - Right Side Bend fingertips 1 in from lateral knee joint line   Lumbar - Left Side Bend 2 in from lateral knee joint line   Strength   Right Hip Flexion 4/5   Right Hip Extension 4-/5   Right Hip ABduction 4/5   Right Hip ADduction 4/5   Left Hip Flexion 4/5   Left Hip Extension 4/5   Left Hip ABduction 4/5   Left Hip ADduction 4/5   Right Knee Flexion 4/5   Right Knee Extension 4+/5   Left Knee Flexion 4/5   Left Knee Extension 4+/5   Right Ankle Dorsiflexion 4+/5   Left Ankle Dorsiflexion 4+/5   Flexibility   Hamstrings bil tightness   Palpation   Palpation comment improved tightness through rt piriformis and hip abductors and into hamstrings                      [facility] Adult PT Treatment/Exercise - 08/06/15 0001    Lumbar Exercises: Stretches   Passive Hamstring Stretch 30 seconds;3 reps   Passive Hamstring Stretch Limitations with strap   Single Knee to Chest Stretch 30 seconds;2 reps   Single Knee to Chest Stretch Limitations bil   Double Knee to Chest Stretch 30 seconds   Piriformis Stretch 3 reps;20 seconds   Piriformis Stretch Limitations supine with strap   Lumbar Exercises: Aerobic   Stationary Bike NuStep Level 4 x 6 min   Lumbar Exercises: Standing   Other Standing Lumbar Exercises myofacial release work supine and standing   Lumbar Exercises: Supine   Ab Set 10 reps  10 sec hold   AB Set Limitations 3 part core                PT Education - 08/06/15 1015    Education provided Yes   Education Details exercises; myofacial release work using balls    Person(s) Educated Patient   Methods Explanation;Demonstration;Tactile cues;Verbal cues;Handout   Comprehension Verbalized understanding;Returned demonstration;Verbal cues required;Tactile cues required             PT Long Term Goals - 08/06/15 1023    PT LONG TERM GOAL #1   Title independent with HEP (08/06/15)   Time 6   Period Weeks   Status On-going   PT LONG TERM  GOAL #2   Title perform lumbar ROM without increase in pain for improved mobility (08/06/15)   Time 6   Period Weeks   Status On-going   PT LONG TERM GOAL #3   Title improve lower extremity strength  to at least 4/5 for improved function and stability (08/06/15)   Time 6   Period Weeks   Status Achieved   PT LONG TERM GOAL #4   Title verbalize understanding of posture/body mechanics to decrease risk of reinjury (08/06/15)   Time 6   Period Weeks   Status Partially Met               Plan - 08/06/15 1021    Clinical Impression Statement good response to myofacial ball release work and core stabilization and piriformis stretch. Will continue with core stabilization and postural correction.   Pt will benefit from skilled therapeutic intervention in order to improve on the following deficits Pain;Decreased strength;Postural dysfunction;Impaired flexibility;Decreased balance;Decreased range of motion;Difficulty walking   Rehab Potential Good   PT Frequency 2x / week   PT Duration 6 weeks   PT Treatment/Interventions ADLs/Self Care Home Management;Cryotherapy;Gait training;Stair training;Traction;Moist Heat;Functional mobility training;Therapeutic activities;Therapeutic exercise;Balance training;Neuromuscular re-education;Manual techniques;Patient/family education;Passive range of motion;Taping   PT Next Visit Plan review updated HEP and progress hip/core strengthening   PT Home Exercise Plan ball release work    Consulted and Agree with Plan of Care Patient        Problem List Patient Active Problem List   Diagnosis Date Noted  . Shortness of breath   . Acute bacterial sinusitis 06/15/2015  . Left hip pain 04/30/2015  . Sciatica 04/30/2015  . Knee pain, right 04/30/2015  . Leukocytes in urine 04/30/2015  . Hyperlipidemia 03/01/2015  . Bilateral hand numbness 03/01/2015  . Pulmonary nodules 02/12/2015  . External hemorrhoids 02/05/2015  . Vaginitis and vulvovaginitis  02/05/2015  . Chronic night sweats 01/08/2015  . Atypical chest pain 01/01/2015  . Chest pain 01/01/2015  . Pneumonia 01/01/2015  . Renal insufficiency 07/16/2014  . Sepsis secondary to UTI 02/05/2014  . Grief reaction 12/29/2013  . Urinary hesitancy 12/04/2013  . Abdominal pain, other specified site 10/26/2013  . Headache(784.0) 10/13/2013  . Pain, joint, multiple sites 08/18/2013  . Vasculitis 07/29/2013  . ANA positive 07/29/2013  . Other and unspecified hyperlipidemia 07/12/2013  . Contusion, chest wall 04/30/2013  . Malignant neoplasm of lower-outer quadrant of female breast 01/03/2013  . Breast cancer, right breast 10/17/2012  . Splenic lesion 09/02/2012  . Asthma, allergic 08/05/2012  . GERD (gastroesophageal reflux disease) 06/03/2012  . CAD (coronary artery disease) 06/03/2012  . Respiratory failure with hypoxia 06/03/2012  . Former heavy tobacco smoker 06/03/2012  . Edema 04/08/2012  . Stricture and stenosis of esophagus 10/19/2011  . Constipation - functional 07/21/2011  . Nonspecific (abnormal) findings on radiological and other examination of biliary tract 07/21/2011  . Acute UTI 06/16/2011  . Osteoporosis 04/17/2011  . SINUSITIS - ACUTE-NOS 02/24/2011  . POSTMENOPAUSAL STATUS 01/27/2011  . CONSTIPATION 11/02/2010  . FIBROCYSTIC BREAST DISEASE, HX OF 10/18/2010  . CAD, NATIVE VESSEL 08/31/2010  . Essential hypertension 08/25/2010  . CAROTID ARTERY DISEASE 08/25/2010  . TOBACCO ABUSE, HX OF 08/25/2010  . GENERALIZED ANXIETY DISORDER 08/03/2010  . FIBROMYALGIA 01/11/2010  . SKIN CANCER, HX OF 01/11/2010    Trevontae Lindahl Nilda Simmer, PT, MPH 08/06/2015, 10:24 AM  [facility] [place] [address] [place], [place], [address] Phone: [phone]   Fax:  [phone]

## 2015-08-06 NOTE — Patient Instructions (Signed)
Myofacial release work using rubber balls of various sizes.  Can check Miracle Ball Method by Ellwood Handler and noble/amazon/google search [place] Myofascial Release.[employer] (yellow ball) Can check target; dollar tree; dollar stores; 5 below     Scapular Retraction (Standing)   With arms at sides, pull shoulder blades down and back hold 10 sec Repeat __10__ times per set.  Do __several__ sessions per day.    Axial Extension (Chin Tuck)   Pull chin in and lengthen back of neck. Hold _10__ seconds . Repeat __10__ times. Do _several___ sessions per day.   Piriformis Stretch   With left leg straight cross right leg over left. Keeping both shoulders and back on floor, Pull right leg across body using strap or belt. Hold 20-30 sec Repeat __3__ times. Do __2=3__ sessions per day.

## 2015-08-10 ENCOUNTER — Ambulatory Visit (INDEPENDENT_AMBULATORY_CARE_PROVIDER_SITE_OTHER): Payer: Medicare Other | Admitting: Rehabilitative and Restorative Service Providers"

## 2015-08-10 ENCOUNTER — Encounter: Payer: Self-pay | Admitting: Rehabilitative and Restorative Service Providers"

## 2015-08-10 DIAGNOSIS — M545 Low back pain: Secondary | ICD-10-CM

## 2015-08-10 DIAGNOSIS — R29898 Other symptoms and signs involving the musculoskeletal system: Secondary | ICD-10-CM | POA: Diagnosis not present

## 2015-08-10 NOTE — Therapy (Signed)
[facility] [place], [place], [address] Phone: [phone]   Fax:  [phone]  Physical Therapy Treatment  Patient Details  Name: [name] MRN: [id number] Date of Birth: 18-Aug-1946 Referring Provider:  Jovita Gamma, MD  Encounter Date: 08/10/2015      PT End of Session - 08/10/15 1235    Visit Number 5   Number of Visits 12   Date for PT Re-Evaluation 08/06/15   PT Start Time 7106   PT Stop Time 2694   PT Time Calculation (min) 47 min   Activity Tolerance Patient tolerated treatment well      Past Medical History  Diagnosis Date  . GERD (gastroesophageal reflux disease)   . Arthritis   . Asthma   . Hypertension   . History of colonic polyps     BENIGN  . Fibromyalgia   . S/P radiation therapy 11/12/12 - 12/05/12    right Breast  . CAD (coronary artery disease) CARDIOLOGIST--  DR MCALHANY    mild non-obstructive cad  . COPD (chronic obstructive pulmonary disease)   . History of breast cancer ONCOLOGIST-- DR Jana Hakim---  NO RECURRANCE    DX 07/2012;  LOW GRADE DCIS  ER+PR+  ----  S/P RIGHT LUMPECTOMY WITH NEGATIVE MARGINS/   RADIATION ENDED 11/2012  . History of chronic bronchitis   . Pelvic pain   . Frequency of urination   . Urgency of urination   . Chronic constipation   . History of tachycardia     CONTROLLED  WITH ATENOLOL  . History of basal cell carcinoma excision     X2  . Sinus headache   . H/O hiatal hernia     Past Surgical History  Procedure Laterality Date  . Nasal sinus surgery  1985  . Cardiovascular stress test  06-18-2012  DR McALHANY    LOW RISK NUCLEAR STUDY/  SMALL FIXED AREA OF MODERATELY DECREASED UPTAKE IN ANTEROSEPTAL WALL WHICH MAY BE ARTIFACTUAL/  NO ISCHEMIA/  EF 68%  . Transthoracic echocardiogram  06-24-2012    GRADE I DIASTOLIC DYSFUNCTION/  EF 55-60%/  MILD MR  . Right breast bx  08-23-2012  . Removal vocal cord cyst  FEB 2014  . Cardiac  catheterization  09-13-2007  DR Lia Foyer    WELL-PRESERVED LVF/  DIFFUSE SCATTERED CORONARY CALCIFACATION AND ATHEROSCLEROSIS WITHOUT OBSTRUCTION  . Cardiac catheterization  08-04-2010  DR [facility]    NON-OBSTRUCTIVE CAD/  pLAD 40%/  oLAD 30%/  mLAD 30%/  pRCA 30%/  EF 60%  . Colonoscopy  09-29-2010  . Breast lumpectomy Right 10-11-2012    W/ SLN BX  . Total abdominal hysterectomy w/ bilateral salpingoophorectomy  1982    W/  APPENDECTOMY  . Tonsillectomy and adenoidectomy  AGE 21  . Orif right ankle  fx  2006  . Right hand surgery  X3  LAST ONE 2009    INCLUDES  ORIF RIGHT 5TH FINGER AND REVISION TWICE  . Cystoscopy with hydrodistension and biopsy N/A 03/06/2014    Procedure: CYSTOSCOPY/HYDRODISTENSION/ INSTILATION OF MARCAINE AND PYRIDIUM;  Surgeon: Ailene Rud, MD;  Location: [facility] [place];  Service: Urology;  Laterality: N/A;    There were no vitals filed for this visit.  Visit Diagnosis:  Bilateral low back pain, with sciatica presence unspecified  Weakness of both lower extremities      Subjective Assessment - 08/10/15 1157    Subjective Patient reports that she is sore because she worked with a ball  that was larger and harder than the one she used in the clinic.     Currently in Pain? Yes   Pain Score 7    Pain Location Hip   Pain Orientation Mid;Lower   Pain Descriptors / Indicators Sore   Pain Type Chronic pain   Pain Onset More than a month ago                         [name] Adult PT Treatment/Exercise - 08/10/15 0001    Lumbar Exercises: Stretches   Passive Hamstring Stretch 30 seconds;3 reps   Passive Hamstring Stretch Limitations with strap   Single Knee to Chest Stretch 30 seconds;2 reps   Single Knee to Chest Stretch Limitations bil   Double Knee to Chest Stretch 30 seconds   Piriformis Stretch 3 reps;20 seconds   Piriformis Stretch Limitations supine with strap   Lumbar Exercises: Standing   Wall Slides 5 reps  10 sec  hold   Wall Slides Limitations shallow bend   Other Standing Lumbar Exercises myofacial release work supine and standing   Lumbar Exercises: Supine   Ab Set 10 reps  10 sec hold   AB Set Limitations 3 part core   Bridge 10 reps  10 sec hold   Moist Heat Therapy   Number Minutes Moist Heat 15 Minutes   Moist Heat Location Lumbar Spine  during stretches due to soreness                PT Education - 08/10/15 1232    Education provided Yes   Education Details not to overdo it with the myofacial ball work and to get the correct sized ball; HEP; discussed pelvic floor rehab for interstitial cystitis - referred to pelvic floor specialist.   Person(s) Educated Patient   Methods Explanation;Demonstration;Tactile cues;Verbal cues;Handout   Comprehension Verbalized understanding;Returned demonstration;Verbal cues required;Tactile cues required             PT Long Term Goals - 08/06/15 1023    PT LONG TERM GOAL #1   Title independent with HEP (08/06/15)   Time 6   Period Weeks   Status On-going   PT LONG TERM GOAL #2   Title perform lumbar ROM without increase in pain for improved mobility (08/06/15)   Time 6   Period Weeks   Status On-going   PT LONG TERM GOAL #3   Title improve lower extremity strength to at least 4/5 for improved function and stability (08/06/15)   Time 6   Period Weeks   Status Achieved   PT LONG TERM GOAL #4   Title verbalize understanding of posture/body mechanics to decrease risk of reinjury (08/06/15)   Time 6   Period Weeks   Status Partially Met               Plan - 08/10/15 1236    Clinical Impression Statement Loved the myofacial release work but did this with a larger, very hard ball and created increased soreness through the hips and legs. Tolerated stretching and added core stabilization exercises without difficulty.   Pt will benefit from skilled therapeutic intervention in order to improve on the following deficits  Pain;Decreased strength;Postural dysfunction;Impaired flexibility;Decreased balance;Decreased range of motion;Difficulty walking   Rehab Potential Good   PT Frequency 2x / week   PT Duration 6 weeks   PT Treatment/Interventions ADLs/Self Care Home Management;Cryotherapy;Gait training;Stair training;Traction;Moist Heat;Functional mobility training;Therapeutic activities;Therapeutic exercise;Balance training;Neuromuscular re-education;Manual techniques;Patient/family education;Passive range of  motion;Taping   PT Next Visit Plan review updated HEP and progress hip/core strengthening   PT Home Exercise Plan ball release work; HEP   Consulted and Agree with Plan of Care Patient        Problem List Patient Active Problem List   Diagnosis Date Noted  . Shortness of breath   . Acute bacterial sinusitis 06/15/2015  . Left hip pain 04/30/2015  . Sciatica 04/30/2015  . Knee pain, right 04/30/2015  . Leukocytes in urine 04/30/2015  . Hyperlipidemia 03/01/2015  . Bilateral hand numbness 03/01/2015  . Pulmonary nodules 02/12/2015  . External hemorrhoids 02/05/2015  . Vaginitis and vulvovaginitis 02/05/2015  . Chronic night sweats 01/08/2015  . Atypical chest pain 01/01/2015  . Chest pain 01/01/2015  . Pneumonia 01/01/2015  . Renal insufficiency 07/16/2014  . Sepsis secondary to UTI 02/05/2014  . Grief reaction 12/29/2013  . Urinary hesitancy 12/04/2013  . Abdominal pain, other specified site 10/26/2013  . Headache(784.0) 10/13/2013  . Pain, joint, multiple sites 08/18/2013  . Vasculitis 07/29/2013  . ANA positive 07/29/2013  . Other and unspecified hyperlipidemia 07/12/2013  . Contusion, chest wall 04/30/2013  . Malignant neoplasm of lower-outer quadrant of female breast 01/03/2013  . Breast cancer, right breast 10/17/2012  . Splenic lesion 09/02/2012  . Asthma, allergic 08/05/2012  . GERD (gastroesophageal reflux disease) 06/03/2012  . CAD (coronary artery disease) 06/03/2012  .  Respiratory failure with hypoxia 06/03/2012  . Former heavy tobacco smoker 06/03/2012  . Edema 04/08/2012  . Stricture and stenosis of esophagus 10/19/2011  . Constipation - functional 07/21/2011  . Nonspecific (abnormal) findings on radiological and other examination of biliary tract 07/21/2011  . Acute UTI 06/16/2011  . Osteoporosis 04/17/2011  . SINUSITIS - ACUTE-NOS 02/24/2011  . POSTMENOPAUSAL STATUS 01/27/2011  . CONSTIPATION 11/02/2010  . FIBROCYSTIC BREAST DISEASE, HX OF 10/18/2010  . CAD, NATIVE VESSEL 08/31/2010  . Essential hypertension 08/25/2010  . CAROTID ARTERY DISEASE 08/25/2010  . TOBACCO ABUSE, HX OF 08/25/2010  . GENERALIZED ANXIETY DISORDER 08/03/2010  . FIBROMYALGIA 01/11/2010  . SKIN CANCER, HX OF 01/11/2010    [name] Nilda Simmer, PT, MPH 08/10/2015, 12:40 PM  [facility] [place], [place], [address] Phone: [phone]   Fax:  [phone]

## 2015-08-13 ENCOUNTER — Encounter: Payer: Self-pay | Admitting: Rehabilitative and Restorative Service Providers"

## 2015-08-13 ENCOUNTER — Ambulatory Visit (INDEPENDENT_AMBULATORY_CARE_PROVIDER_SITE_OTHER): Payer: Medicare Other | Admitting: Rehabilitative and Restorative Service Providers"

## 2015-08-13 DIAGNOSIS — R29898 Other symptoms and signs involving the musculoskeletal system: Secondary | ICD-10-CM | POA: Diagnosis not present

## 2015-08-13 DIAGNOSIS — M545 Low back pain: Secondary | ICD-10-CM

## 2015-08-13 NOTE — Patient Instructions (Signed)
Strengthening: Hip Abduction (Side-Lying)   Tighten muscles on side of left thigh, then lift leg __10-12__ inches from surface, keeping knee locked.  Keep up onto right side. Left leg stays straight and back. Lead up with heel. Repeat _5-10___ times per set. Do __1-2__ sets per session. Do __1__ sessions per day.

## 2015-08-13 NOTE — Therapy (Signed)
[place], [place], [address] Phone: [phone]   Fax:  [phone]  Physical Therapy Treatment  Patient Details  Name: [name] MRN: [id number] Date of Birth: 05-19-1946 Referring Provider:  Jovita Gamma, MD  Encounter Date: 08/13/2015      PT End of Session - 08/13/15 0958    Visit Number 6   Number of Visits 12   Date for PT Re-Evaluation 08/06/15   PT Start Time 0947   PT Stop Time 1040   PT Time Calculation (min) 53 min   Activity Tolerance Patient tolerated treatment well      Past Medical History  Diagnosis Date  . GERD (gastroesophageal reflux disease)   . Arthritis   . Asthma   . Hypertension   . History of colonic polyps     BENIGN  . Fibromyalgia   . S/P radiation therapy 11/12/12 - 12/05/12    right Breast  . CAD (coronary artery disease) CARDIOLOGIST--  DR MCALHANY    mild non-obstructive cad  . COPD (chronic obstructive pulmonary disease)   . History of breast cancer ONCOLOGIST-- DR Jana Hakim---  NO RECURRANCE    DX 07/2012;  LOW GRADE DCIS  ER+PR+  ----  S/P RIGHT LUMPECTOMY WITH NEGATIVE MARGINS/   RADIATION ENDED 11/2012  . History of chronic bronchitis   . Pelvic pain   . Frequency of urination   . Urgency of urination   . Chronic constipation   . History of tachycardia     CONTROLLED  WITH ATENOLOL  . History of basal cell carcinoma excision     X2  . Sinus headache   . H/O hiatal hernia     Past Surgical History  Procedure Laterality Date  . Nasal sinus surgery  1985  . Cardiovascular stress test  06-18-2012  DR McALHANY    LOW RISK NUCLEAR STUDY/  SMALL FIXED AREA OF MODERATELY DECREASED UPTAKE IN ANTEROSEPTAL WALL WHICH MAY BE ARTIFACTUAL/  NO ISCHEMIA/  EF 68%  . Transthoracic echocardiogram  06-24-2012    GRADE I DIASTOLIC DYSFUNCTION/  EF 55-60%/  MILD MR  . Right breast bx  08-23-2012  . Removal vocal cord cyst  FEB 2014  . Cardiac  catheterization  09-13-2007  DR Lia Foyer    WELL-PRESERVED LVF/  DIFFUSE SCATTERED CORONARY CALCIFACATION AND ATHEROSCLEROSIS WITHOUT OBSTRUCTION  . Cardiac catheterization  08-04-2010  DR [facility]    NON-OBSTRUCTIVE CAD/  pLAD 40%/  oLAD 30%/  mLAD 30%/  pRCA 30%/  EF 60%  . Colonoscopy  09-29-2010  . Breast lumpectomy Right 10-11-2012    W/ SLN BX  . Total abdominal hysterectomy w/ bilateral salpingoophorectomy  1982    W/  APPENDECTOMY  . Tonsillectomy and adenoidectomy  AGE 42  . Orif right ankle  fx  2006  . Right hand surgery  X3  LAST ONE 2009    INCLUDES  ORIF RIGHT 5TH FINGER AND REVISION TWICE  . Cystoscopy with hydrodistension and biopsy N/A 03/06/2014    Procedure: CYSTOSCOPY/HYDRODISTENSION/ INSTILATION OF MARCAINE AND PYRIDIUM;  Surgeon: Ailene Rud, MD;  Location: [facility];  Service: Urology;  Laterality: N/A;    There were no vitals filed for this visit.  Visit Diagnosis:  Bilateral low back pain, with sciatica presence unspecified  Weakness of both lower extremities      Subjective Assessment - 08/13/15 0947    Subjective Mona reports that she worked in the yard for 3-4 hours yesterday.  Had increased back pain related to the yard work. Still irritated today but not as bad as yesterday. Does note that she can reach to the top shelf in the kitchen cabinet now. Got a ball to use for myofacial work.   Currently in Pain? Yes   Pain Score 5    Pain Location Hip   Pain Orientation Left;Right   Pain Descriptors / Indicators Sore   Pain Type Chronic pain                         [facility] Adult PT Treatment/Exercise - 08/13/15 0001    Lumbar Exercises: Stretches   Passive Hamstring Stretch 30 seconds;3 reps   Passive Hamstring Stretch Limitations with strap   Single Knee to Chest Stretch 30 seconds;2 reps   Single Knee to Chest Stretch Limitations bil   Double Knee to Chest Stretch 30 seconds   Piriformis Stretch 3 reps;20 seconds    Piriformis Stretch Limitations supine with strap   Lumbar Exercises: Standing   Wall Slides 5 reps  10 sec hold   Wall Slides Limitations shallow bend   Other Standing Lumbar Exercises myofacial release work supine and standing   Lumbar Exercises: Supine   Ab Set 10 reps  10 sec hold   AB Set Limitations 3 part core   Clam 10 reps   Clam Limitations with ab set   Bridge 10 reps  10 sec hold   Bridge Limitations with ball adding adductors 10 reps 10 sec hold   Lumbar Exercises: Sidelying   Hip Abduction 5 reps;2 seconds   Hip Abduction Limitations 10 reps 2 sec hold leading with heel/LE fully extended    Moist Heat Therapy   Number Minutes Moist Heat 15 Minutes   Moist Heat Location Lumbar Spine                PT Education - 08/13/15 1135    Education provided Yes   Education Details Education re activity level - to work for shorter periods of time and stretch before and after activites, use myofacial release bal and ice. Added hip abduction in sidelying   Person(s) Educated Patient   Methods Explanation;Demonstration;Tactile cues;Verbal cues;Handout   Comprehension Verbalized understanding;Returned demonstration;Verbal cues required;Tactile cues required             PT Long Term Goals - 08/06/15 1023    PT LONG TERM GOAL #1   Title independent with HEP (08/06/15)   Time 6   Period Weeks   Status On-going   PT LONG TERM GOAL #2   Title perform lumbar ROM without increase in pain for improved mobility (08/06/15)   Time 6   Period Weeks   Status On-going   PT LONG TERM GOAL #3   Title improve lower extremity strength to at least 4/5 for improved function and stability (08/06/15)   Time 6   Period Weeks   Status Achieved   PT LONG TERM GOAL #4   Title verbalize understanding of posture/body mechanics to decrease risk of reinjury (08/06/15)   Time 6   Period Weeks   Status Partially Met               Plan - 08/13/15 0959    Clinical Impression  Statement Patient tight with increased pain today from yard work yesterday. Will continue work on core stabilization and strengthening. Difficulty lying on either side. Difficulty progressing exercises. Good progress overall with patient reporting less LBP  and increased functional activity level. She can now reach into the top shelf in her kitchen cabinets and ascend/descend stairs without holding rail and even carrying light weight items.    Pt will benefit from skilled therapeutic intervention in order to improve on the following deficits Pain;Decreased strength;Postural dysfunction;Impaired flexibility;Decreased balance;Decreased range of motion;Difficulty walking   Rehab Potential --   PT Frequency 1x / week   PT Duration --   PT Treatment/Interventions ADLs/Self Care Home Management;Cryotherapy;Gait training;Stair training;Traction;Moist Heat;Functional mobility training;Therapeutic activities;Therapeutic exercise;Balance training;Neuromuscular re-education;Manual techniques;Patient/family education;Passive range of motion;Taping   PT Next Visit Plan progress hip/core strengthening   PT Home Exercise Plan ball release work; HEP   Consulted and Agree with Plan of Care Patient        Problem List Patient Active Problem List   Diagnosis Date Noted  . Shortness of breath   . Acute bacterial sinusitis 06/15/2015  . Left hip pain 04/30/2015  . Sciatica 04/30/2015  . Knee pain, right 04/30/2015  . Leukocytes in urine 04/30/2015  . Hyperlipidemia 03/01/2015  . Bilateral hand numbness 03/01/2015  . Pulmonary nodules 02/12/2015  . External hemorrhoids 02/05/2015  . Vaginitis and vulvovaginitis 02/05/2015  . Chronic night sweats 01/08/2015  . Atypical chest pain 01/01/2015  . Chest pain 01/01/2015  . Pneumonia 01/01/2015  . Renal insufficiency 07/16/2014  . Sepsis secondary to UTI 02/05/2014  . Grief reaction 12/29/2013  . Urinary hesitancy 12/04/2013  . Abdominal pain, other specified  site 10/26/2013  . Headache(784.0) 10/13/2013  . Pain, joint, multiple sites 08/18/2013  . Vasculitis 07/29/2013  . ANA positive 07/29/2013  . Other and unspecified hyperlipidemia 07/12/2013  . Contusion, chest wall 04/30/2013  . Malignant neoplasm of lower-outer quadrant of female breast 01/03/2013  . Breast cancer, right breast 10/17/2012  . Splenic lesion 09/02/2012  . Asthma, allergic 08/05/2012  . GERD (gastroesophageal reflux disease) 06/03/2012  . CAD (coronary artery disease) 06/03/2012  . Respiratory failure with hypoxia 06/03/2012  . Former heavy tobacco smoker 06/03/2012  . Edema 04/08/2012  . Stricture and stenosis of esophagus 10/19/2011  . Constipation - functional 07/21/2011  . Nonspecific (abnormal) findings on radiological and other examination of biliary tract 07/21/2011  . Acute UTI 06/16/2011  . Osteoporosis 04/17/2011  . SINUSITIS - ACUTE-NOS 02/24/2011  . POSTMENOPAUSAL STATUS 01/27/2011  . CONSTIPATION 11/02/2010  . FIBROCYSTIC BREAST DISEASE, HX OF 10/18/2010  . CAD, NATIVE VESSEL 08/31/2010  . Essential hypertension 08/25/2010  . CAROTID ARTERY DISEASE 08/25/2010  . TOBACCO ABUSE, HX OF 08/25/2010  . GENERALIZED ANXIETY DISORDER 08/03/2010  . FIBROMYALGIA 01/11/2010  . SKIN CANCER, HX OF 01/11/2010    Charlita Brian Nilda Simmer, PT, [facility] 08/13/2015, 11:42 AM  [facility] [place], [place], [address] Phone: [phone]   Fax:  [phone]

## 2015-08-17 ENCOUNTER — Other Ambulatory Visit: Payer: Self-pay | Admitting: Oncology

## 2015-08-17 DIAGNOSIS — Z853 Personal history of malignant neoplasm of breast: Secondary | ICD-10-CM

## 2015-08-19 ENCOUNTER — Encounter: Payer: Self-pay | Admitting: Rehabilitative and Restorative Service Providers"

## 2015-08-19 ENCOUNTER — Ambulatory Visit (INDEPENDENT_AMBULATORY_CARE_PROVIDER_SITE_OTHER): Payer: Medicare Other | Admitting: Rehabilitative and Restorative Service Providers"

## 2015-08-19 DIAGNOSIS — R29898 Other symptoms and signs involving the musculoskeletal system: Secondary | ICD-10-CM

## 2015-08-19 DIAGNOSIS — M545 Low back pain: Secondary | ICD-10-CM

## 2015-08-19 NOTE — Therapy (Signed)
[place], [place], [address] Phone: [phone]   Fax:  [phone]  Physical Therapy Treatment  Patient Details  Name: [name] MRN: [id number] Date of Birth: October 21, 1946 Referring Provider:  Jovita Gamma, MD  Encounter Date: 08/19/2015      PT End of Session - 08/19/15 1031    Visit Number 7   Number of Visits 12   Date for PT Re-Evaluation 09/06/15   PT Start Time 0936   PT Stop Time 1030   PT Time Calculation (min) 54 min   Activity Tolerance Patient tolerated treatment well      Past Medical History  Diagnosis Date  . GERD (gastroesophageal reflux disease)   . Arthritis   . Asthma   . Hypertension   . History of colonic polyps     BENIGN  . Fibromyalgia   . S/P radiation therapy 11/12/12 - 12/05/12    right Breast  . CAD (coronary artery disease) CARDIOLOGIST--  DR MCALHANY    mild non-obstructive cad  . COPD (chronic obstructive pulmonary disease)   . History of breast cancer ONCOLOGIST-- DR Jana Hakim---  NO RECURRANCE    DX 07/2012;  LOW GRADE DCIS  ER+PR+  ----  S/P RIGHT LUMPECTOMY WITH NEGATIVE MARGINS/   RADIATION ENDED 11/2012  . History of chronic bronchitis   . Pelvic pain   . Frequency of urination   . Urgency of urination   . Chronic constipation   . History of tachycardia     CONTROLLED  WITH ATENOLOL  . History of basal cell carcinoma excision     X2  . Sinus headache   . H/O hiatal hernia     Past Surgical History  Procedure Laterality Date  . Nasal sinus surgery  1985  . Cardiovascular stress test  06-18-2012  DR McALHANY    LOW RISK NUCLEAR STUDY/  SMALL FIXED AREA OF MODERATELY DECREASED UPTAKE IN ANTEROSEPTAL WALL WHICH MAY BE ARTIFACTUAL/  NO ISCHEMIA/  EF 68%  . Transthoracic echocardiogram  06-24-2012    GRADE I DIASTOLIC DYSFUNCTION/  EF 55-60%/  MILD MR  . Right breast bx  08-23-2012  . Removal vocal cord cyst  FEB 2014  . Cardiac  catheterization  09-13-2007  DR Lia Foyer    WELL-PRESERVED LVF/  DIFFUSE SCATTERED CORONARY CALCIFACATION AND ATHEROSCLEROSIS WITHOUT OBSTRUCTION  . Cardiac catheterization  08-04-2010  DR [facility]    NON-OBSTRUCTIVE CAD/  pLAD 40%/  oLAD 30%/  mLAD 30%/  pRCA 30%/  EF 60%  . Colonoscopy  09-29-2010  . Breast lumpectomy Right 10-11-2012    W/ SLN BX  . Total abdominal hysterectomy w/ bilateral salpingoophorectomy  1982    W/  APPENDECTOMY  . Tonsillectomy and adenoidectomy  AGE 56  . Orif right ankle  fx  2006  . Right hand surgery  X3  LAST ONE 2009    INCLUDES  ORIF RIGHT 5TH FINGER AND REVISION TWICE  . Cystoscopy with hydrodistension and biopsy N/A 03/06/2014    Procedure: CYSTOSCOPY/HYDRODISTENSION/ INSTILATION OF MARCAINE AND PYRIDIUM;  Surgeon: Ailene Rud, MD;  Location: [facility];  Service: Urology;  Laterality: N/A;    There were no vitals filed for this visit.  Visit Diagnosis:  Bilateral low back pain, with sciatica presence unspecified  Weakness of both lower extremities      Subjective Assessment - 08/19/15 0938    Subjective [name] reports that she drove to [facility] to help her daughter ready  her classroom for the school year. She was reaching up; sustained postures overhead; lifting; bending; reaching; ect... Had significant increase paiin in the back and up into shoulders   Currently in Pain? Yes   Pain Score 5    Pain Location Back   Pain Orientation Mid   Pain Descriptors / Indicators Sharp;Discomfort   Pain Type Chronic pain   Pain Radiating Towards hips up into back   Pain Onset More than a month ago   Pain Frequency Intermittent            [facility] PT Assessment - 08/19/15 0001    AROM   Lumbar Flexion finger tips 9 in from floor   Lumbar Extension 25%   Lumbar - Right Side Bend fingertips 1 in from lateral knee joint line   Lumbar - Left Side Bend 2 in from lateral knee joint line   Flexibility   Hamstrings bil tightness  Rt 65 deg; Lt 68 deg   Palpation   Palpation comment improved tightness through rt piriformis and hip abductors and into hamstrings                      [facility] Adult PT Treatment/Exercise - 08/19/15 0001    Lumbar Exercises: Stretches   Passive Hamstring Stretch 30 seconds;3 reps   Passive Hamstring Stretch Limitations with strap   Single Knee to Chest Stretch 30 seconds;2 reps   Single Knee to Chest Stretch Limitations bil   Double Knee to Chest Stretch 30 seconds   Piriformis Stretch 3 reps;20 seconds   Piriformis Stretch Limitations supine with strap   Lumbar Exercises: Standing   Scapular Retraction Both;10 reps;Theraband   Theraband Level (Scapular Retraction) Level 1 (Yellow)   Row Both;10 reps   Theraband Level (Row) Level 1 (Yellow)   Shoulder Extension Both;10 reps;Theraband   Theraband Level (Shoulder Extension) Level 1 (Yellow)   Lumbar Exercises: Supine   Ab Set 10 reps  10 sec hold   AB Set Limitations 3 part core   Clam 10 reps   Clam Limitations with ab set   Bridge 10 reps  10 sec hold   Bridge Limitations with ball adding adductors 10 reps 10 sec hold   Moist Heat Therapy   Number Minutes Moist Heat 15 Minutes   Moist Heat Location Lumbar Spine;Cervical                PT Education - 08/19/15 1011    Education provided Yes   Education Details Education about overdoing activities - encouraged to pace activities; reviewed HEP; added HEP   Person(s) Educated Patient   Methods Explanation;Demonstration;Tactile cues;Verbal cues;Handout   Comprehension Verbalized understanding;Returned demonstration;Verbal cues required;Tactile cues required             PT Long Term Goals - 08/19/15 1042    PT LONG TERM GOAL #1   Title independent with HEP (09/06/15)   Time 6   Period Weeks   Status On-going   PT LONG TERM GOAL #2   Title perform lumbar ROM without increase in pain for improved mobility (09/06/15)   Time 6   Period Weeks   Status  On-going   PT LONG TERM GOAL #3   Title improve lower extremity strength to at least 4/5 for improved function and stability (08/06/15)   Time 6   Period Weeks   Status Achieved   PT LONG TERM GOAL #4   Title verbalize understanding of posture/body mechanics to decrease risk  of reinjury (09/06/15)   Time 6   Period Weeks   Status Partially Met   PT LONG TERM GOAL #5   Title FOTO score improved to < 50% limited (09/06/15)   Time 6   Period Weeks   Status On-going               Plan - 08/19/15 1032    Clinical Impression Statement Poor judgement re activity level. Increases activity and has increased pain related to overdoing activities. Does report that she is sleeping better, awakening less frequently and feeling more relaxed for sleep. She continues to work oward stated goals.   Pt will benefit from skilled therapeutic intervention in order to improve on the following deficits Pain;Decreased strength;Postural dysfunction;Impaired flexibility;Decreased balance;Decreased range of motion;Difficulty walking   Rehab Potential Good   PT Frequency 1x / week   PT Duration 6 weeks   PT Treatment/Interventions ADLs/Self Care Home Management;Cryotherapy;Gait training;Stair training;Traction;Moist Heat;Functional mobility training;Therapeutic activities;Therapeutic exercise;Balance training;Neuromuscular re-education;Manual techniques;Patient/family education;Passive range of motion;Taping   PT Next Visit Plan progress hip/core strengthening   PT Home Exercise Plan ball release work; HEP   Consulted and Agree with Plan of Care Patient        Problem List Patient Active Problem List   Diagnosis Date Noted  . Shortness of breath   . Acute bacterial sinusitis 06/15/2015  . Left hip pain 04/30/2015  . Sciatica 04/30/2015  . Knee pain, right 04/30/2015  . Leukocytes in urine 04/30/2015  . Hyperlipidemia 03/01/2015  . Bilateral hand numbness 03/01/2015  . Pulmonary nodules 02/12/2015   . External hemorrhoids 02/05/2015  . Vaginitis and vulvovaginitis 02/05/2015  . Chronic night sweats 01/08/2015  . Atypical chest pain 01/01/2015  . Chest pain 01/01/2015  . Pneumonia 01/01/2015  . Renal insufficiency 07/16/2014  . Sepsis secondary to UTI 02/05/2014  . Grief reaction 12/29/2013  . Urinary hesitancy 12/04/2013  . Abdominal pain, other specified site 10/26/2013  . Headache(784.0) 10/13/2013  . Pain, joint, multiple sites 08/18/2013  . Vasculitis 07/29/2013  . ANA positive 07/29/2013  . Other and unspecified hyperlipidemia 07/12/2013  . Contusion, chest wall 04/30/2013  . Malignant neoplasm of lower-outer quadrant of female breast 01/03/2013  . Breast cancer, right breast 10/17/2012  . Splenic lesion 09/02/2012  . Asthma, allergic 08/05/2012  . GERD (gastroesophageal reflux disease) 06/03/2012  . CAD (coronary artery disease) 06/03/2012  . Respiratory failure with hypoxia 06/03/2012  . Former heavy tobacco smoker 06/03/2012  . Edema 04/08/2012  . Stricture and stenosis of esophagus 10/19/2011  . Constipation - functional 07/21/2011  . Nonspecific (abnormal) findings on radiological and other examination of biliary tract 07/21/2011  . Acute UTI 06/16/2011  . Osteoporosis 04/17/2011  . SINUSITIS - ACUTE-NOS 02/24/2011  . POSTMENOPAUSAL STATUS 01/27/2011  . CONSTIPATION 11/02/2010  . FIBROCYSTIC BREAST DISEASE, HX OF 10/18/2010  . CAD, NATIVE VESSEL 08/31/2010  . Essential hypertension 08/25/2010  . CAROTID ARTERY DISEASE 08/25/2010  . TOBACCO ABUSE, HX OF 08/25/2010  . GENERALIZED ANXIETY DISORDER 08/03/2010  . FIBROMYALGIA 01/11/2010  . SKIN CANCER, HX OF 01/11/2010    Gila Lauf Nilda Simmer, PT, MPH 08/19/2015, 10:43 AM  [facility] [place], [place], [address] Phone: [phone]   Fax:  [phone]

## 2015-08-19 NOTE — Patient Instructions (Signed)
  HIP: Hamstrings - Supine  BEND ONE KNEE  Place strap around foot. Raise leg up, keep knee straight. Hold __30_ seconds. _3__ reps per set, _2-3__ sets per day   Pelvic Squeeze / Protected Hip: Adduction / Abduction   Get ON TARGET. Lie on back,engage core, ball between legs. Push knees into ball.  Hold _10__ seconds. Repeat _10__ times. Do __1-2  sessions per day.    Resisted External Rotation: in Neutral - Bilateral   PALMS UP Sit or stand, tubing in both hands, elbows at sides, bent to 90, forearms forward. Pinch shoulder blades together and rotate forearms out. Keep elbows at sides. Repeat __10__ times per set. Do _2-3___ sets per session. Do _2-3___ sessions per day.   Low Row: Standing   Face anchor, feet shoulder width apart. Palms up, pull arms back, squeezing shoulder blades together. Repeat 10__ times per set. Do 2-3__ sets per session. Do 2-3__ sessions per week. Anchor Height: Waist     Strengthening: Resisted Extension   Hold tubing in right hand, arm forward. Pull arm back, elbow straight. Repeat _10___ times per set. Do 2-3____ sets per session. Do 2-3____ sessions per day.

## 2015-08-20 ENCOUNTER — Ambulatory Visit
Admission: RE | Admit: 2015-08-20 | Discharge: 2015-08-20 | Disposition: A | Discharge status: Home / Self Care | Payer: Medicare Other | Source: Ambulatory Visit | Attending: Oncology | Admitting: Oncology

## 2015-08-20 DIAGNOSIS — Z853 Personal history of malignant neoplasm of breast: Secondary | ICD-10-CM

## 2015-08-25 ENCOUNTER — Encounter: Payer: Self-pay | Admitting: Rehabilitative and Restorative Service Providers"

## 2015-08-25 ENCOUNTER — Ambulatory Visit (INDEPENDENT_AMBULATORY_CARE_PROVIDER_SITE_OTHER): Payer: Medicare Other | Admitting: Rehabilitative and Restorative Service Providers"

## 2015-08-25 DIAGNOSIS — R29898 Other symptoms and signs involving the musculoskeletal system: Secondary | ICD-10-CM

## 2015-08-25 DIAGNOSIS — M545 Low back pain: Secondary | ICD-10-CM

## 2015-08-25 NOTE — Patient Instructions (Signed)
Strengthening: Hip Abduction - Resisted   Lying on back hips and knees bent, feet flat on surface. Theraband is tied around legs just above the knees. Tighten core. Tighten legs bringing knees apart. Hold 1-2 sec Repeat _10___ times per set. Do _1-2___ sets per session. Do _1___ sessions per day.    Can combine bridging with ball squeeze.

## 2015-08-25 NOTE — Therapy (Signed)
[place], [place], [address] Phone: [phone]   Fax:  [phone]  Physical Therapy Treatment  Patient Details  Name: [name] MRN: [id number] Date of Birth: December 07, 1946 Referring Provider:  Jovita Gamma, MD  Encounter Date: 08/25/2015      PT End of Session - 08/25/15 1114    Visit Number 8   Number of Visits 12   Date for PT Re-Evaluation 09/06/15   PT Start Time 1021   PT Stop Time 1113   PT Time Calculation (min) 52 min   Activity Tolerance Patient tolerated treatment well      Past Medical History  Diagnosis Date  . GERD (gastroesophageal reflux disease)   . Arthritis   . Asthma   . Hypertension   . History of colonic polyps     BENIGN  . Fibromyalgia   . S/P radiation therapy 11/12/12 - 12/05/12    right Breast  . CAD (coronary artery disease) CARDIOLOGIST--  DR MCALHANY    mild non-obstructive cad  . COPD (chronic obstructive pulmonary disease)   . History of breast cancer ONCOLOGIST-- DR Jana Hakim---  NO RECURRANCE    DX 07/2012;  LOW GRADE DCIS  ER+PR+  ----  S/P RIGHT LUMPECTOMY WITH NEGATIVE MARGINS/   RADIATION ENDED 11/2012  . History of chronic bronchitis   . Pelvic pain   . Frequency of urination   . Urgency of urination   . Chronic constipation   . History of tachycardia     CONTROLLED  WITH ATENOLOL  . History of basal cell carcinoma excision     X2  . Sinus headache   . H/O hiatal hernia     Past Surgical History  Procedure Laterality Date  . Nasal sinus surgery  1985  . Cardiovascular stress test  06-18-2012  DR McALHANY    LOW RISK NUCLEAR STUDY/  SMALL FIXED AREA OF MODERATELY DECREASED UPTAKE IN ANTEROSEPTAL WALL WHICH MAY BE ARTIFACTUAL/  NO ISCHEMIA/  EF 68%  . Transthoracic echocardiogram  06-24-2012    GRADE I DIASTOLIC DYSFUNCTION/  EF 55-60%/  MILD MR  . Right breast bx  08-23-2012  . Removal vocal cord cyst  FEB 2014  . Cardiac  catheterization  09-13-2007  DR Lia Foyer    WELL-PRESERVED LVF/  DIFFUSE SCATTERED CORONARY CALCIFACATION AND ATHEROSCLEROSIS WITHOUT OBSTRUCTION  . Cardiac catheterization  08-04-2010  DR [facility]    NON-OBSTRUCTIVE CAD/  pLAD 40%/  oLAD 30%/  mLAD 30%/  pRCA 30%/  EF 60%  . Colonoscopy  09-29-2010  . Breast lumpectomy Right 10-11-2012    W/ SLN BX  . Total abdominal hysterectomy w/ bilateral salpingoophorectomy  1982    W/  APPENDECTOMY  . Tonsillectomy and adenoidectomy  AGE 45  . Orif right ankle  fx  2006  . Right hand surgery  X3  LAST ONE 2009    INCLUDES  ORIF RIGHT 5TH FINGER AND REVISION TWICE  . Cystoscopy with hydrodistension and biopsy N/A 03/06/2014    Procedure: CYSTOSCOPY/HYDRODISTENSION/ INSTILATION OF MARCAINE AND PYRIDIUM;  Surgeon: Ailene Rud, MD;  Location: [facility];  Service: Urology;  Laterality: N/A;    There were no vitals filed for this visit.  Visit Diagnosis:  Bilateral low back pain, with sciatica presence unspecified  Weakness of both lower extremities      Subjective Assessment - 08/25/15 1025    Subjective [name] reports that she limited her time pulling weeds this week. She  pulled weeds for only ~30 min. Her back is feeling much better. Slept last night without pain in the hips. Using the ball to release the muscle tightness. She is having some trouble with vision.   Currently in Pain? Yes   Pain Score 3    Pain Location Back   Pain Orientation Mid   Pain Descriptors / Indicators Sharp;Discomfort   Pain Type Chronic pain                         [facility] Adult PT Treatment/Exercise - 08/25/15 0001    Lumbar Exercises: Stretches   Passive Hamstring Stretch 30 seconds;3 reps   Passive Hamstring Stretch Limitations with strap   Single Knee to Chest Stretch 30 seconds;2 reps   Single Knee to Chest Stretch Limitations bil   Double Knee to Chest Stretch 30 seconds   Piriformis Stretch 3 reps;20 seconds    Piriformis Stretch Limitations supine with strap   Lumbar Exercises: Aerobic   Stationary Bike NuStep Level 4 x 6 min   Lumbar Exercises: Standing   Scapular Retraction Both;10 reps;Theraband   Theraband Level (Scapular Retraction) Level 1 (Yellow)   Row Both;10 reps   Theraband Level (Row) Level 1 (Yellow)   Shoulder Extension Both;10 reps;Theraband   Theraband Level (Shoulder Extension) Level 1 (Yellow)   Lumbar Exercises: Supine   Ab Set 10 reps  10 sec hold   AB Set Limitations 3 part core   Clam 10 reps   Clam Limitations with ab set   Bridge 10 reps  10 sec hold   Bridge Limitations with ball adding adductors 10 reps 10 sec hold   Other Supine Lumbar Exercises hip abduction with red TB 10 reps 5 reps   Moist Heat Therapy   Number Minutes Moist Heat 10 Minutes   Moist Heat Location Lumbar Spine                PT Education - 08/25/15 1113    Education provided Yes   Education Details reviewed HEP; corrections and modifications as needed; added exercises   Person(s) Educated Patient   Methods Explanation;Demonstration;Tactile cues;Verbal cues;Handout   Comprehension Verbalized understanding;Returned demonstration;Verbal cues required;Tactile cues required             PT Long Term Goals - 08/19/15 1042    PT LONG TERM GOAL #1   Title independent with HEP (09/06/15)   Time 6   Period Weeks   Status On-going   PT LONG TERM GOAL #2   Title perform lumbar ROM without increase in pain for improved mobility (09/06/15)   Time 6   Period Weeks   Status On-going   PT LONG TERM GOAL #3   Title improve lower extremity strength to at least 4/5 for improved function and stability (08/06/15)   Time 6   Period Weeks   Status Achieved   PT LONG TERM GOAL #4   Title verbalize understanding of posture/body mechanics to decrease risk of reinjury (09/06/15)   Time 6   Period Weeks   Status Partially Met   PT LONG TERM GOAL #5   Title FOTO score improved to < 50%  limited (09/06/15)   Time 6   Period Weeks   Status On-going               Plan - 08/25/15 1208    Clinical Impression Statement Much better week with patient limiting acitvities that irritate symptoms yet still preforming ADL's  and additional activities she enjoys like spray painting furniture and pulling weeds without flare up of LBP.    Pt will benefit from skilled therapeutic intervention in order to improve on the following deficits Pain;Decreased strength;Postural dysfunction;Impaired flexibility;Decreased balance;Decreased range of motion;Difficulty walking   Rehab Potential Good   PT Frequency 1x / week   PT Duration 6 weeks   PT Treatment/Interventions ADLs/Self Care Home Management;Cryotherapy;Gait training;Stair training;Traction;Moist Heat;Functional mobility training;Therapeutic activities;Therapeutic exercise;Balance training;Neuromuscular re-education;Manual techniques;Patient/family education;Passive range of motion;Taping   PT Next Visit Plan progress hip/core strengthening as indicated - discuss discharge to I HEP if patient continues to progress with HEP and avoids activities that flare up symptoms   PT Home Exercise Plan HEP; myofacial release work   Oncologist with Plan of Care Patient        Problem List Patient Active Problem List   Diagnosis Date Noted  . Shortness of breath   . Acute bacterial sinusitis 06/15/2015  . Left hip pain 04/30/2015  . Sciatica 04/30/2015  . Knee pain, right 04/30/2015  . Leukocytes in urine 04/30/2015  . Hyperlipidemia 03/01/2015  . Bilateral hand numbness 03/01/2015  . Pulmonary nodules 02/12/2015  . External hemorrhoids 02/05/2015  . Vaginitis and vulvovaginitis 02/05/2015  . Chronic night sweats 01/08/2015  . Atypical chest pain 01/01/2015  . Chest pain 01/01/2015  . Pneumonia 01/01/2015  . Renal insufficiency 07/16/2014  . Sepsis secondary to UTI 02/05/2014  . Grief reaction 12/29/2013  . Urinary  hesitancy 12/04/2013  . Abdominal pain, other specified site 10/26/2013  . Headache(784.0) 10/13/2013  . Pain, joint, multiple sites 08/18/2013  . Vasculitis 07/29/2013  . ANA positive 07/29/2013  . Other and unspecified hyperlipidemia 07/12/2013  . Contusion, chest wall 04/30/2013  . Malignant neoplasm of lower-outer quadrant of female breast 01/03/2013  . Breast cancer, right breast 10/17/2012  . Splenic lesion 09/02/2012  . Asthma, allergic 08/05/2012  . GERD (gastroesophageal reflux disease) 06/03/2012  . CAD (coronary artery disease) 06/03/2012  . Respiratory failure with hypoxia 06/03/2012  . Former heavy tobacco smoker 06/03/2012  . Edema 04/08/2012  . Stricture and stenosis of esophagus 10/19/2011  . Constipation - functional 07/21/2011  . Nonspecific (abnormal) findings on radiological and other examination of biliary tract 07/21/2011  . Acute UTI 06/16/2011  . Osteoporosis 04/17/2011  . SINUSITIS - ACUTE-NOS 02/24/2011  . POSTMENOPAUSAL STATUS 01/27/2011  . CONSTIPATION 11/02/2010  . FIBROCYSTIC BREAST DISEASE, HX OF 10/18/2010  . CAD, NATIVE VESSEL 08/31/2010  . Essential hypertension 08/25/2010  . CAROTID ARTERY DISEASE 08/25/2010  . TOBACCO ABUSE, HX OF 08/25/2010  . GENERALIZED ANXIETY DISORDER 08/03/2010  . FIBROMYALGIA 01/11/2010  . SKIN CANCER, HX OF 01/11/2010    Berlinda Farve Nilda Simmer PT, MPH 08/25/2015, 12:17 PM  [facility] [place], [place], [address] Phone: [phone]   Fax:  [phone]

## 2015-09-01 ENCOUNTER — Encounter: Payer: Medicare Other | Admitting: Rehabilitative and Restorative Service Providers"

## 2015-09-07 ENCOUNTER — Encounter: Payer: Medicare Other | Admitting: Rehabilitative and Restorative Service Providers"

## 2015-09-07 ENCOUNTER — Encounter: Payer: Self-pay | Admitting: Gastroenterology

## 2015-09-27 ENCOUNTER — Encounter: Payer: Medicare Other | Admitting: Rehabilitative and Restorative Service Providers"

## 2015-10-01 ENCOUNTER — Ambulatory Visit (INDEPENDENT_AMBULATORY_CARE_PROVIDER_SITE_OTHER): Payer: Medicare Other | Admitting: Rehabilitative and Restorative Service Providers"

## 2015-10-01 ENCOUNTER — Encounter: Payer: Self-pay | Admitting: Rehabilitative and Restorative Service Providers"

## 2015-10-01 DIAGNOSIS — R29898 Other symptoms and signs involving the musculoskeletal system: Secondary | ICD-10-CM

## 2015-10-01 DIAGNOSIS — M545 Low back pain: Secondary | ICD-10-CM | POA: Diagnosis not present

## 2015-10-01 NOTE — Therapy (Signed)
[place], [place], [address] Phone: [phone]   Fax:  [phone]  Physical Therapy Treatment  Patient Details  Name: [name] MRN: [id number] Date of Birth: Nov 23, 1946 Referring Provider:  Jovita Gamma, MD  Encounter Date: 10/01/2015      PT End of Session - 10/01/15 1243    Visit Number 9   Number of Visits 12   Date for PT Re-Evaluation 10/01/15   PT Start Time 1112   PT Stop Time 1207   PT Time Calculation (min) 55 min   Activity Tolerance Patient tolerated treatment well      Past Medical History  Diagnosis Date  . GERD (gastroesophageal reflux disease)   . Arthritis   . Asthma   . Hypertension   . History of colonic polyps     BENIGN  . Fibromyalgia   . S/P radiation therapy 11/12/12 - 12/05/12    right Breast  . CAD (coronary artery disease) CARDIOLOGIST--  DR MCALHANY    mild non-obstructive cad  . COPD (chronic obstructive pulmonary disease) ([place])   . History of breast cancer ONCOLOGIST-- DR Jana Hakim---  NO RECURRANCE    DX 07/2012;  LOW GRADE DCIS  ER+PR+  ----  S/P RIGHT LUMPECTOMY WITH NEGATIVE MARGINS/   RADIATION ENDED 11/2012  . History of chronic bronchitis   . Pelvic pain   . Frequency of urination   . Urgency of urination   . Chronic constipation   . History of tachycardia     CONTROLLED  WITH ATENOLOL  . History of basal cell carcinoma excision     X2  . Sinus headache   . H/O hiatal hernia     Past Surgical History  Procedure Laterality Date  . Nasal sinus surgery  1985  . Cardiovascular stress test  06-18-2012  DR McALHANY    LOW RISK NUCLEAR STUDY/  SMALL FIXED AREA OF MODERATELY DECREASED UPTAKE IN ANTEROSEPTAL WALL WHICH MAY BE ARTIFACTUAL/  NO ISCHEMIA/  EF 68%  . Transthoracic echocardiogram  06-24-2012    GRADE I DIASTOLIC DYSFUNCTION/  EF 55-60%/  MILD MR  . Right breast bx  08-23-2012  . Removal vocal cord cyst  FEB 2014  . Cardiac  catheterization  09-13-2007  DR Lia Foyer    WELL-PRESERVED LVF/  DIFFUSE SCATTERED CORONARY CALCIFACATION AND ATHEROSCLEROSIS WITHOUT OBSTRUCTION  . Cardiac catheterization  08-04-2010  DR [facility]    NON-OBSTRUCTIVE CAD/  pLAD 40%/  oLAD 30%/  mLAD 30%/  pRCA 30%/  EF 60%  . Colonoscopy  09-29-2010  . Breast lumpectomy Right 10-11-2012    W/ SLN BX  . Total abdominal hysterectomy w/ bilateral salpingoophorectomy  1982    W/  APPENDECTOMY  . Tonsillectomy and adenoidectomy  AGE 58  . Orif right ankle  fx  2006  . Right hand surgery  X3  LAST ONE 2009    INCLUDES  ORIF RIGHT 5TH FINGER AND REVISION TWICE  . Cystoscopy with hydrodistension and biopsy N/A 03/06/2014    Procedure: CYSTOSCOPY/HYDRODISTENSION/ INSTILATION OF MARCAINE AND PYRIDIUM;  Surgeon: Ailene Rud, MD;  Location: [facility];  Service: Urology;  Laterality: N/A;    There were no vitals filed for this visit.  Visit Diagnosis:  Bilateral low back pain, with sciatica presence unspecified  Weakness of both lower extremities      Subjective Assessment - 10/01/15 1114    Subjective Vaughan Basta reposrt that she has had trouble with eyes catarac surgery  07 and 09/11/15. Has had trouble with diqqiness which MD feels is related to ineer ear trouble and may have to have sinus surgery. She has also had trouble with fibromyalgis - having pain everywhere.  She continues to have LBP but she is doing some exercise which helps her maintain some function.    Currently in Pain? Yes   Pain Score 4    Pain Location Back   Pain Orientation Mid;Left   Pain Descriptors / Indicators Dull;Discomfort   Pain Type Chronic pain   Pain Onset More than a month ago   Pain Frequency Constant            [facility] PT Assessment - 10/01/15 0001    Assessment   Medical Diagnosis low back pain with radiculopathy   Next MD Visit 10/06/15   Observation/Other Assessments   Focus on Therapeutic Outcomes (FOTO)  34% limitation   AROM    Lumbar Flexion finger tips 8 in from floor   Lumbar Extension 35%   Lumbar - Right Side Bend fingertips 1 in from lateral knee joint line   Lumbar - Left Side Bend 2 in from lateral knee joint line   Lumbar - Right Rotation 30%   Lumbar - Left Rotation 35%   Strength   Right Hip Flexion --  5-/5   Right Hip Extension --  5-/5   Right Hip ABduction --  5-/5   Right Hip ADduction --  5-/5   Left Hip Flexion 4+/5   Left Hip Extension 4+/5   Left Hip ABduction --  5-/5   Left Hip ADduction 4+/5   Right Knee Flexion 5/5   Right Knee Extension 5/5   Left Knee Flexion 4+/5   Left Knee Extension 5/5   Right Ankle Dorsiflexion 5/5   Left Ankle Dorsiflexion 5/5   Flexibility   Hamstrings Rt 79 deg; Lt 76 deg   Palpation   Palpation comment tightness through piriformis and hip abductors Rt > Lt; lumbar paraspinals bilat Lt > Rt    Special Tests    Special Tests Lumbar;Sacrolliac Tests   Lumbar Tests Straight Leg Raise   Sacroiliac Tests  Pelvic Compression   Straight Leg Raise   Findings Negative   Comment tightness bil   Pelvic Dictraction   Findings Negative   Pelvic Compression   Findings Positive                     [facility] Adult PT Treatment/Exercise - 10/01/15 0001    Lumbar Exercises: Stretches   Passive Hamstring Stretch 30 seconds;3 reps   Passive Hamstring Stretch Limitations with strap   Single Knee to Chest Stretch 30 seconds;2 reps   Single Knee to Chest Stretch Limitations bil   Double Knee to Chest Stretch 30 seconds   Piriformis Stretch 3 reps;20 seconds   Piriformis Stretch Limitations supine with strap   Lumbar Exercises: Supine   Ab Set 10 reps  10 sec hold   AB Set Limitations 3 part core   Bridge 20 reps  10 sec hold   Bridge Limitations with ball adding adductors 10 reps 10 sec hold   Other Supine Lumbar Exercises hip abduction with red TB 10 reps 5 reps   Moist Heat Therapy   Number Minutes Moist Heat 15 Minutes   Moist Heat  Location Lumbar Spine                PT Education - 10/01/15 1242    Education provided  Yes   Education Details reviewed HEP; encouraged condidtent home program as other medical conditions allow   Person(s) Educated Patient   Methods Explanation;Demonstration;Tactile cues;Verbal cues   Comprehension Verbalized understanding;Returned demonstration;Verbal cues required;Tactile cues required             PT Long Term Goals - 10/01/15 1259    PT LONG TERM GOAL #1   Title independent with HEP (09/06/15)   Time 6   Period Weeks   Status Achieved   PT LONG TERM GOAL #2   Title perform lumbar ROM without increase in pain for improved mobility (09/06/15)   Time 6   Period Weeks   Status Achieved   PT LONG TERM GOAL #3   Title improve lower extremity strength to at least 4/5 for improved function and stability (08/06/15)   Time 6   Period Weeks   Status Achieved   PT LONG TERM GOAL #4   Title verbalize understanding of posture/body mechanics to decrease risk of reinjury (09/06/15)   Time 6   Period Weeks   Status Achieved   PT LONG TERM GOAL #5   Title FOTO score improved to < 50% limited (09/06/15)   Baseline FOTO 34%   Time 6   Period Weeks   Status Achieved               Plan - 10/01/15 1244    Clinical Impression Statement Louna demonstrates some mprovement in spinal and LE mobility and good improvement in LE strength and functional abilities. She reports that pain has improved and is no longer constant in nature. She is having other medical problems at this time and is not sure if she wants to continue treatment at this time. She feels confident in continuing her HEP as she tolerates and can tell a difference if she does not do her exercises - even a few exercises make a difference.     Pt will benefit from skilled therapeutic intervention in order to improve on the following deficits Pain;Decreased strength;Postural dysfunction;Impaired flexibility;Decreased  balance;Decreased range of motion;Difficulty walking   Rehab Potential Good   PT Frequency 1x / week   PT Duration 6 weeks   PT Treatment/Interventions ADLs/Self Care Home Management;Cryotherapy;Gait training;Stair training;Traction;Moist Heat;Functional mobility training;Therapeutic activities;Therapeutic exercise;Balance training;Neuromuscular re-education;Manual techniques;Patient/family education;Passive range of motion;Taping   PT Next Visit Plan Patient to return to MD 10/06/15 and will contact us if she wants to schedule further appts   PT Home Exercise Plan HEP; myofacial ball release work    Consulted and Agree with Plan of Care Patient        Problem List Patient Active Problem List   Diagnosis Date Noted  . Shortness of breath   . Acute bacterial sinusitis 06/15/2015  . Left hip pain 04/30/2015  . Sciatica 04/30/2015  . Knee pain, right 04/30/2015  . Leukocytes in urine 04/30/2015  . Hyperlipidemia 03/01/2015  . Bilateral hand numbness 03/01/2015  . Pulmonary nodules 02/12/2015  . External hemorrhoids 02/05/2015  . Vaginitis and vulvovaginitis 02/05/2015  . Chronic night sweats 01/08/2015  . Atypical chest pain 01/01/2015  . Chest pain 01/01/2015  . Pneumonia 01/01/2015  . Renal insufficiency 07/16/2014  . Sepsis secondary to UTI ([place]) 02/05/2014  . Grief reaction 12/29/2013  . Urinary hesitancy 12/04/2013  . Abdominal pain, other specified site 10/26/2013  . Headache(784.0) 10/13/2013  . Pain, joint, multiple sites 08/18/2013  . Vasculitis ([place]) 07/29/2013  . ANA positive 07/29/2013  . Other and unspecified hyperlipidemia 07/12/2013  .  Contusion, chest wall 04/30/2013  . Malignant neoplasm of lower-outer quadrant of female breast ([place]) 01/03/2013  . Breast cancer, right breast ([place]) 10/17/2012  . Splenic lesion 09/02/2012  . Asthma, allergic 08/05/2012  . GERD (gastroesophageal reflux disease) 06/03/2012  . CAD (coronary artery disease) 06/03/2012  .  Respiratory failure with hypoxia ([place]) 06/03/2012  . Former heavy tobacco smoker 06/03/2012  . Edema 04/08/2012  . Stricture and stenosis of esophagus 10/19/2011  . Constipation - functional 07/21/2011  . Nonspecific (abnormal) findings on radiological and other examination of biliary tract 07/21/2011  . Acute UTI 06/16/2011  . Osteoporosis 04/17/2011  . SINUSITIS - ACUTE-NOS 02/24/2011  . POSTMENOPAUSAL STATUS 01/27/2011  . CONSTIPATION 11/02/2010  . FIBROCYSTIC BREAST DISEASE, HX OF 10/18/2010  . CAD, NATIVE VESSEL 08/31/2010  . Essential hypertension 08/25/2010  . CAROTID ARTERY DISEASE 08/25/2010  . TOBACCO ABUSE, HX OF 08/25/2010  . GENERALIZED ANXIETY DISORDER 08/03/2010  . FIBROMYALGIA 01/11/2010  . SKIN CANCER, HX OF 01/11/2010    Jerita Wimbush Nilda Simmer PT, MPH 10/01/2015, 1:09 PM  [facility] [place], [place], [address] Phone: [phone]   Fax:  [phone]   PHYSICAL THERAPY DISCHARGE SUMMARY  Visits from Start of Care: 9  Current functional level related to goals / functional outcomes: Progressed well with goals accomplished and I in HEP. Increased functional activity level.   Remaining deficits: Multiple medical problems preventing all exercises on a regular basis. Rondalyn does some exercises - as tolerated   Education / Equipment: HEP  Plan: Patient agrees to discharge.  Patient goals were met. Patient is being discharged due to meeting the stated rehab goals.  ?????    Jennie Bolar P. Helene Kelp PT, MPH 10/01/2015 1:12 PM

## 2015-10-08 ENCOUNTER — Other Ambulatory Visit: Payer: Self-pay | Admitting: Internal Medicine

## 2015-10-08 DIAGNOSIS — R42 Dizziness and giddiness: Secondary | ICD-10-CM

## 2015-10-20 ENCOUNTER — Ambulatory Visit
Admission: RE | Admit: 2015-10-20 | Discharge: 2015-10-20 | Disposition: A | Discharge status: Home / Self Care | Payer: Medicare Other | Source: Ambulatory Visit | Attending: Internal Medicine | Admitting: Internal Medicine

## 2015-10-20 DIAGNOSIS — R42 Dizziness and giddiness: Secondary | ICD-10-CM

## 2015-10-22 ENCOUNTER — Encounter: Payer: Self-pay | Admitting: Gastroenterology

## 2015-12-08 ENCOUNTER — Encounter: Payer: Self-pay | Admitting: Gastroenterology

## 2015-12-22 ENCOUNTER — Ambulatory Visit (AMBULATORY_SURGERY_CENTER): Payer: Self-pay

## 2015-12-22 VITALS — Ht 60.75 in | Wt 118.6 lb

## 2015-12-22 DIAGNOSIS — Z860100 Personal history of colon polyps, unspecified: Secondary | ICD-10-CM

## 2015-12-22 NOTE — Progress Notes (Signed)
Allergic to flu virus vaccine No allergies to eggs or soy No diet/weight loss meds No home oxygen No past problems with anesthesia but has been checked for malignant hyperthermia has had the "shakes" awlfully bad before following general anesthesia  Has email and internet; refused emmi

## 2016-01-05 ENCOUNTER — Encounter: Payer: Self-pay | Admitting: Gastroenterology

## 2016-01-05 ENCOUNTER — Ambulatory Visit (AMBULATORY_SURGERY_CENTER): Payer: Medicare Other | Admitting: Gastroenterology

## 2016-01-05 VITALS — BP 124/62 | HR 68 | Temp 98.6°F | Resp 22 | Ht 65.0 in | Wt 117.0 lb

## 2016-01-05 DIAGNOSIS — D124 Benign neoplasm of descending colon: Secondary | ICD-10-CM | POA: Diagnosis not present

## 2016-01-05 DIAGNOSIS — Z86010 Personal history of colonic polyps: Secondary | ICD-10-CM | POA: Diagnosis not present

## 2016-01-05 DIAGNOSIS — Z800 Family history of malignant neoplasm of digestive organs: Secondary | ICD-10-CM | POA: Diagnosis not present

## 2016-01-05 MED ORDER — SODIUM CHLORIDE 0.9 % IV SOLN: 500.0000 mL | INTRAVENOUS | Status: DC

## 2016-01-05 NOTE — Op Note (Signed)
[place]  [address] [place], [address]   COLONOSCOPY PROCEDURE REPORT  PATIENT: [name], [name]  MR#: [id number] BIRTHDATE: Oct 21, 1946 , 69  yrs. old GENDER: female ENDOSCOPIST: Yetta Flock, MD REFERRED BY: Jani Gravel MD PROCEDURE DATE:  01/05/2016 PROCEDURE:   Colonoscopy, screening and Colonoscopy with snare polypectomy First Screening Colonoscopy - Avg.  risk and is 50 yrs.  old or older - No.  Prior Negative Screening - Now for repeat screening. Less than 10 yrs Prior Negative Screening - Now for repeat screening.  Above average risk  History of Adenoma - Now for follow-up colonoscopy & has been > or = to 3 yrs.  N/A  Polyps removed today? Yes ASA CLASS:   Class III INDICATIONS:Screening for colonic neoplasia and FH Colon or Rectal Adenocarcinoma (mother age > 32, father ? colon cancer). MEDICATIONS: Propofol 200 mg IV  DESCRIPTION OF PROCEDURE:   After the risks benefits and alternatives of the procedure were thoroughly explained, informed consent was obtained.  The digital rectal exam revealed no abnormalities of the rectum.   The LB [id number]  endoscope was introduced through the anus and advanced to the cecum, which was identified by both the appendix and ileocecal valve. No adverse events experienced.   The quality of the prep was adequate  The instrument was then slowly withdrawn as the colon was fully examined. Estimated blood loss is zero unless otherwise noted in this procedure report.  COLON FINDINGS: A sessile polyp measuring 4 mm in size was found in the descending colon.  A polypectomy was performed with a cold snare.  The resection was complete, the polyp tissue was completely retrieved and sent to histology.   Melanosis coli was found throughout the entire examined colon.   The colon was tortous with significant looping.   The examination was otherwise normal. Retroflexed views revealed internal hemorrhoids. The time to  cecum = 5.4 Withdrawal time = 13.5   The scope was withdrawn and the procedure completed. COMPLICATIONS: There were no immediate complications.  ENDOSCOPIC IMPRESSION: 1.   Sessile polyp was found in the descending colon; polypectomy was performed with a cold snare 2.   Melanosis coli was found throughout the entire examined colon 3.   The colon was tortous with significant looping 4.   The examination was otherwise normal  RECOMMENDATIONS: Resume diet Resume medications No NSAIDs for one week Await pathology results  eSigned:  Yetta Flock, MD 01/05/2016 9:38 AM  cc:  Jani Gravel MD, the patient   PATIENT NAME:  [name], [name] MR#: [id number]

## 2016-01-05 NOTE — Progress Notes (Signed)
Stable to RR 

## 2016-01-05 NOTE — Progress Notes (Signed)
Called to room to assist during endoscopic procedure.  Patient ID and intended procedure confirmed with present staff. Received instructions for my participation in the procedure from the performing physician.  

## 2016-01-05 NOTE — Patient Instructions (Signed)
YOU HAD AN ENDOSCOPIC PROCEDURE TODAY AT [facility]:   Refer to the procedure report that was given to you for any specific questions about what was found during the examination.  If the procedure report does not answer your questions, please call your gastroenterologist to clarify.  If you requested that your care partner not be given the details of your procedure findings, then the procedure report has been included in a sealed envelope for you to review at your convenience later.  YOU SHOULD EXPECT: Some feelings of bloating in the abdomen. Passage of more gas than usual.  Walking can help get rid of the air that was put into your GI tract during the procedure and reduce the bloating. If you had a lower endoscopy (such as a colonoscopy or flexible sigmoidoscopy) you may notice spotting of blood in your stool or on the toilet paper. If you underwent a bowel prep for your procedure, you may not have a normal bowel movement for a few days.  Please Note:  You might notice some irritation and congestion in your nose or some drainage.  This is from the oxygen used during your procedure.  There is no need for concern and it should clear up in a day or so.  SYMPTOMS TO REPORT IMMEDIATELY:   Following lower endoscopy (colonoscopy or flexible sigmoidoscopy):  Excessive amounts of blood in the stool  Significant tenderness or worsening of abdominal pains  Swelling of the abdomen that is new, acute  Fever of 100F or higher   For urgent or emergent issues, a gastroenterologist can be reached at any hour by calling [phone].   DIET: Your first meal following the procedure should be a small meal and then it is ok to progress to your normal diet. Heavy or fried foods are harder to digest and may make you feel nauseous or bloated.  Likewise, meals heavy in dairy and vegetables can increase bloating.  Drink plenty of fluids but you should avoid alcoholic beverages for 24  hours.  ACTIVITY:  You should plan to take it easy for the rest of today and you should NOT DRIVE or use heavy machinery until tomorrow (because of the sedation medicines used during the test).    FOLLOW UP: Our staff will call the number listed on your records the next business day following your procedure to check on you and address any questions or concerns that you may have regarding the information given to you following your procedure. If we do not reach you, we will leave a message.  However, if you are feeling well and you are not experiencing any problems, there is no need to return our call.  We will assume that you have returned to your regular daily activities without incident.  If any biopsies were taken you will be contacted by phone or by letter within the next 1-3 weeks.  Please call us at [phone] if you have not heard about the biopsies in 3 weeks.    SIGNATURES/CONFIDENTIALITY: You and/or your care partner have signed paperwork which will be entered into your electronic medical record.  These signatures attest to the fact that that the information above on your After Visit Summary has been reviewed and is understood.  Full responsibility of the confidentiality of this discharge information lies with you and/or your care-partner.  No NSAIDS ie: Aleve, Ibuprofen, and aspirin for 1 week per Dr. Havery Moros.  Thank-you for choosing [place] for your healthcare needs.

## 2016-01-06 ENCOUNTER — Telehealth: Payer: Self-pay

## 2016-01-06 NOTE — Telephone Encounter (Signed)
Left message on answering machine. 

## 2016-01-12 ENCOUNTER — Encounter: Payer: Self-pay | Admitting: Gastroenterology

## 2016-01-25 ENCOUNTER — Encounter: Payer: Self-pay | Admitting: Emergency Medicine

## 2016-01-25 ENCOUNTER — Ambulatory Visit (INDEPENDENT_AMBULATORY_CARE_PROVIDER_SITE_OTHER): Payer: Medicare Other | Admitting: Emergency Medicine

## 2016-01-25 VITALS — BP 124/74 | HR 73 | Ht 65.0 in | Wt 119.0 lb

## 2016-01-25 DIAGNOSIS — R911 Solitary pulmonary nodule: Secondary | ICD-10-CM | POA: Diagnosis not present

## 2016-01-25 DIAGNOSIS — R918 Other nonspecific abnormal finding of lung field: Secondary | ICD-10-CM

## 2016-01-25 DIAGNOSIS — J309 Allergic rhinitis, unspecified: Secondary | ICD-10-CM | POA: Diagnosis not present

## 2016-01-25 DIAGNOSIS — J4520 Mild intermittent asthma, uncomplicated: Secondary | ICD-10-CM | POA: Diagnosis not present

## 2016-01-25 HISTORY — DX: Allergic rhinitis, unspecified: J30.9

## 2016-01-25 NOTE — Assessment & Plan Note (Signed)
Continue albuterol when necessary for now. I suspect that better control of her rhinitis will improve her asthma symptoms. No plans to start maintenance bronchodilator at this time

## 2016-01-25 NOTE — Assessment & Plan Note (Signed)
Right upper lobe nodule. She is due for a follow-up CT scan in March 2017. I will follow-up with her to review the results afterwards performed.

## 2016-01-25 NOTE — Assessment & Plan Note (Signed)
She is either having a worsening of her chronic allergic symptoms due to some exposure versus a viral upper respiratory infection. These of increased her congestion and drainage. We reviewed the signs and symptoms of bronchitis and an exacerbation of her asthma. I do not believe she needs to be treated for these at this time but we will do so if her symptoms evolve. For now we will continue her saline washes, nasal steroid, try adding symptomatic relief like decongestants and also chlorpheniramine as needed.

## 2016-01-25 NOTE — Progress Notes (Signed)
Subjective:    Patient ID: [name], female    DOB: April 08, 1946, 70 y.o.   MRN: [id number]  HPI 70 yo woman, former smoker (10-15pk-yrs), hx allergic rhinitis, HTN, GERD, depression, tachyarrhythmia, non-obstructive CAD, per cath 07/2010, s/p Hysterectomy, s/p Throat cyst removal, s/p skin cancer removal in past, admitted to [facility] ED on 06/03/12 with cc of persistent cough, worsening SOB and wheeze, dx with COPD exacerbation, but also some question of whether she may have had cardiac dz - subsequent myoview and TTE have been reassuring [name]). In retrospect she now mentions that she has always been sensitive to fumes, allergies even as a girl - never dx with asthma officially.  Exacerbating factors appear to be fumes, allergies, GERD.  ROV 09/24/12 -- follow up for SOB and cough, w hx throat cyst as above. 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 Nasonex was stopped. She also has seen [facility] regarding recurrence of her breast CA. She underwent PFT today that confirm moderately severe AFL with BD response. She has only intermittent sx, has rarely used albuterol. Her allergies, fumes, are triggers.   ROV 02/12/15 -- follow-up visit for cough and dyspnea. Her spirometry in the past has shown obstruction with a positive bronchodilator response. She has been using albuterol as needed when she flares. She had been well until late 11/2014 when she developed URI sx. She was in the hospital for this - was dealing with cough, mucous, dyspnea. She has a lot of nasal and sinus congestion, has continued to cough. She underwent CT-PA on 12/30/14 that showed RUL nodular opacities. She is having exertional SOB, no wheeze. She is scheduled to undergo a cardiac stress test soon. She is using flonase,   ROV 03/2015 follow-up visit for obstructive lung disease with a positive bronchodilator response. She also has a history of pulmonary nodule that we've been following last CT scan. Her most  recent was done in March 2016 and shows that the 5 mm right upper lobe nodule is stable. She is currently on . She tells me that her breathing is much better. She has been using loratadine and flonase. She has not needed her SABA prn. She is not on a scheduled BD. Cardiac stress testing > low risk test.   ROV 01/25/16 -- Follow-up visit for asthmatic bronchitis as well as a history of a vocal cord hemangioma, allergic rhinitis, chronic cough. She also has a history of a 5 mm right upper lobe pulmonary nodule, last CT scan was March 2016.   She has had cataract surgery that did not restore her vision well.  She has been well until about 2-3 days ago, has been good about using nasal steroid and saline. She is now having sore throat, raspy tight feeling.  She has proair available, almost never uses it. No other flares reported. She is being treated monthly by Dr Wilburn Cornelia with electrocautery.     PULMONARY FUNCTON TEST 09/24/2012  FVC 2.45  FEV1 1.31  FEV1/FVC 53.5  FVC  % Predicted 81  FEV % Predicted 60  FeF 25-75 .48  FeF 25-75 % Predicted 2.44       Objective:   Physical Exam Filed Vitals:   01/25/16 1435 01/25/16 1437  BP:  124/74  Pulse:  73  Height: 5\' 5"  (1.651 m)   Weight: 119 lb (53.978 kg)   SpO2:  96%   Gen: Pleasant, well-nourished, in no distress,  normal affect  ENT: No  lesions,  mouth clear,  oropharynx clear, no postnasal drip  Neck: No JVD, no TMG, no carotid bruits  Lungs: No use of accessory muscles, no dullness to percussion, clear without rales or rhonchi  Cardiovascular: RRR, heart sounds normal, no murmur or gallops, no peripheral edema  Musculoskeletal: No deformities, no cyanosis or clubbing  Neuro: alert, non focal  Skin: Warm, no lesions or rashes  02/23/15 --  Reviewed by me personally COMPARISON: 01/01/2015  FINDINGS: Mediastinum: Normal heart size. There is no pericardial effusion identified. Calcified atherosclerotic disease noted involving  the LAD Coronary artery. The trachea is patent and appears midline. There is a normal appearance of the esophagus. No mediastinal or hilar lymph nodes identified.  Lungs/Pleura: No pleural effusion noted. 5 mm right upper lobe nodule is unchanged from previous exam, image 17 of series 3. Within the basilar portion of a right middle lobe there is a stable scar like opacity, image 30/series 3. No new pulmonary nodules identified.  Upper Abdomen: Right hepatic lobe hemangioma is again noted, image 62/series 2. The visualized portions of the adrenal glands and spleen are normal.  Musculoskeletal: There are no aggressive lytic or sclerotic bone lesions identified. There is a mild scoliosis deformity and multi level degenerative disc disease.  IMPRESSION: 1. No acute cardiopulmonary abnormalities. 2. Stable 5 mm right upper lobe nodule. If this patient is at increased risk for lung cancer then the next followup exam should be obtained If the patient is at high risk for bronchogenic carcinoma, follow-up chest CT at 6-12 months is recommended. If the patient is at low risk for bronchogenic carcinoma, follow-up chest CT at 12 months is recommended. This recommendation follows the consensus statement: Guidelines for Management of Small Pulmonary Nodules Detected on CT Scans: A Statement from the [employer] as published in Radiology 2005;237:395-400       Assessment & Plan:  Asthma, allergic Continue albuterol when necessary for now. I suspect that better control of her rhinitis will improve her asthma symptoms. No plans to start maintenance bronchodilator at this time  Allergic rhinitis She is either having a worsening of her chronic allergic symptoms due to some exposure versus a viral upper respiratory infection. These of increased her congestion and drainage. We reviewed the signs and symptoms of bronchitis and an exacerbation of her asthma. I do not believe she needs to  be treated for these at this time but we will do so if her symptoms evolve. For now we will continue her saline washes, nasal steroid, try adding symptomatic relief like decongestants and also chlorpheniramine as needed.   Pulmonary nodules Right upper lobe nodule. She is due for a follow-up CT scan in March 2017. I will follow-up with her to review the results afterwards performed.

## 2016-01-25 NOTE — Patient Instructions (Signed)
We will repeat your CT scan of the chest without contrast in March 2017 to follow pulmonary nodule Please continue nasal saline washes and nasal steroid spray as you have been taking them Try adding chlorpheniramine 4 mg up to every 6 hours as needed for congestion and drainage Try symptomatic relief like Tylenol Cold and flu as needed for the next 7-10 days Call our office if you develop fever, increase in sputum production, change in color of your sputum. If so then you may need to be treated for bronchitis Follow with Dr Lamonte Sakai in March after your CT scan to review the results.

## 2016-01-28 ENCOUNTER — Telehealth: Payer: Self-pay | Admitting: Emergency Medicine

## 2016-01-28 MED ORDER — DOXYCYCLINE HYCLATE 100 MG PO TABS: 100.0000 mg | ORAL_TABLET | Freq: Two times a day (BID) | ORAL | Status: DC

## 2016-01-28 NOTE — Telephone Encounter (Signed)
Please call in doxycycline 100mg  bid x 7 days.

## 2016-01-28 NOTE — Telephone Encounter (Signed)
Per 01/25/16 OV: Patient Instructions       We will repeat your CT scan of the chest without contrast in March 2017 to follow pulmonary nodule Please continue nasal saline washes and nasal steroid spray as you have been taking them Try adding chlorpheniramine 4 mg up to every 6 hours as needed for congestion and drainage Try symptomatic relief like Tylenol Cold and flu as needed for the next 7-10 days Call our office if you develop fever, increase in sputum production, change in color of your sputum. If so then you may need to be treated for bronchitis Follow with Dr Lamonte Sakai in March after your CT scan to review the results.  --  Called spoke with pt. She c/o prod cough (yellow-green-brown phlem), chest congestion. Wants something called in. Please advise Dr. Lamonte Sakai. thanks

## 2016-01-28 NOTE — Telephone Encounter (Signed)
Called spoke with pt. Aware of recs. RX sent in. Nothing further needed 

## 2016-01-28 NOTE — Telephone Encounter (Signed)
Pt returning call says she doesn't want to go the weekend w/o med please advise.[name]

## 2016-01-28 NOTE — Telephone Encounter (Signed)
lmtcb X1 for pt  

## 2016-02-11 ENCOUNTER — Ambulatory Visit: Payer: Medicare Other | Admitting: Gastroenterology

## 2016-02-28 ENCOUNTER — Ambulatory Visit (INDEPENDENT_AMBULATORY_CARE_PROVIDER_SITE_OTHER)
Admission: RE | Admit: 2016-02-28 | Discharge: 2016-02-28 | Disposition: A | Discharge status: Home / Self Care | Payer: Medicare Other | Source: Ambulatory Visit | Attending: Emergency Medicine | Admitting: Emergency Medicine

## 2016-02-28 DIAGNOSIS — R911 Solitary pulmonary nodule: Secondary | ICD-10-CM

## 2016-03-02 ENCOUNTER — Ambulatory Visit (INDEPENDENT_AMBULATORY_CARE_PROVIDER_SITE_OTHER): Payer: Medicare Other | Admitting: Emergency Medicine

## 2016-03-02 ENCOUNTER — Encounter: Payer: Self-pay | Admitting: Emergency Medicine

## 2016-03-02 VITALS — BP 132/74 | HR 70 | Ht 65.0 in | Wt 119.0 lb

## 2016-03-02 DIAGNOSIS — J4520 Mild intermittent asthma, uncomplicated: Secondary | ICD-10-CM

## 2016-03-02 DIAGNOSIS — J309 Allergic rhinitis, unspecified: Secondary | ICD-10-CM | POA: Diagnosis not present

## 2016-03-02 DIAGNOSIS — R918 Other nonspecific abnormal finding of lung field: Secondary | ICD-10-CM

## 2016-03-02 DIAGNOSIS — K219 Gastro-esophageal reflux disease without esophagitis: Secondary | ICD-10-CM | POA: Diagnosis not present

## 2016-03-02 NOTE — Assessment & Plan Note (Signed)
Managed with albuterol when necessary. As long as her GERD and her allergic rhinitis are well controlled she has not needed schedule bronchodilators. I'll follow with her in 6 months or sooner if necessary

## 2016-03-02 NOTE — Assessment & Plan Note (Signed)
Not seen on her most recent CT scan of the chest. I suspect that this is benign lesion and I would like for her to have one final CT scan of the chest done in January 2018.

## 2016-03-02 NOTE — Progress Notes (Signed)
Subjective:    Patient ID: [name], female    DOB: 11/09/1946, 70 y.o.   MRN: [id number]  HPI 70 yo woman, former smoker (10-15pk-yrs), hx allergic rhinitis, HTN, GERD, depression, tachyarrhythmia, non-obstructive CAD, per cath 07/2010, s/p Hysterectomy, s/p Throat cyst removal, s/p skin cancer removal in past, admitted to [facility] ED on 06/03/12 with cc of persistent cough, worsening SOB and wheeze, dx with COPD exacerbation, but also some question of whether she may have had cardiac dz - subsequent myoview and TTE have been reassuring [name]). In retrospect she now mentions that she has always been sensitive to fumes, allergies even as a girl - never dx with asthma officially.  Exacerbating factors appear to be fumes, allergies, GERD.  ROV 09/24/12 -- follow up for SOB and cough, w hx throat cyst as above. 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 Nasonex was stopped. She also has seen [facility] regarding recurrence of her breast CA. She underwent PFT today that confirm moderately severe AFL with BD response. She has only intermittent sx, has rarely used albuterol. Her allergies, fumes, are triggers.   ROV 02/12/15 -- follow-up visit for cough and dyspnea. Her spirometry in the past has shown obstruction with a positive bronchodilator response. She has been using albuterol as needed when she flares. She had been well until late 11/2014 when she developed URI sx. She was in the hospital for this - was dealing with cough, mucous, dyspnea. She has a lot of nasal and sinus congestion, has continued to cough. She underwent CT-PA on 12/30/14 that showed RUL nodular opacities. She is having exertional SOB, no wheeze. She is scheduled to undergo a cardiac stress test soon. She is using flonase,   ROV 03/2015 follow-up visit for obstructive lung disease with a positive bronchodilator response. She also has a history of pulmonary nodule that we've been following last CT scan. Her most  recent was done in March 2016 and shows that the 5 mm right upper lobe nodule is stable. She is currently on . She tells me that her breathing is much better. She has been using loratadine and flonase. She has not needed her SABA prn. She is not on a scheduled BD. Cardiac stress testing > low risk test.   ROV 01/25/16 -- Follow-up visit for asthmatic bronchitis as well as a history of a vocal cord hemangioma, allergic rhinitis, chronic cough. She also has a history of a 5 mm right upper lobe pulmonary nodule, last CT scan was March 2016.   She has had cataract surgery that did not restore her vision well.  She has been well until about 2-3 days ago, has been good about using nasal steroid and saline. She is now having sore throat, raspy tight feeling.  She has proair available, almost never uses it. No other flares reported. She is being treated monthly by Dr Wilburn Cornelia with electrocautery.   ROV 03/02/16 -- follow-up visit for history of asthma and chronic bronchitis, chronic cough, allergic rhinitis, and history of a vocal cord hemangioma. We have also followed her with serial CT scans of the chest for a 5 mm right upper lobe nodule. Her most recent scan was 02/28/16 and I personally reviewed the images. This shows some biapical scarring and apparent resolution of her peripheral right upper lobe nodule. There are no new nodules seen. She is experiencing cough and sputum in February and was treated with a course of doxycycline for bronchitis. She  feels that she is doing well, has noticed some exertional SOB up stairs. Also some increased GERD sx on omeprazole bid, to see GI soon. She is doing nasal washes, nasal steroid, loratadine. Following w Dr Wilburn Cornelia.     PULMONARY FUNCTON TEST 09/24/2012  FVC 2.45  FEV1 1.31  FEV1/FVC 53.5  FVC  % Predicted 81  FEV % Predicted 60  FeF 25-75 .48  FeF 25-75 % Predicted 2.44       Objective:   Physical Exam Filed Vitals:   03/02/16 1049  BP: 132/74  Pulse: 70   Height: 5\' 5"  (1.651 m)  Weight: 119 lb (53.978 kg)  SpO2: 95%   Gen: Pleasant, well-nourished, in no distress,  normal affect  ENT: No lesions,  mouth clear,  oropharynx clear, no postnasal drip  Neck: No JVD, no TMG, no carotid bruits  Lungs: No use of accessory muscles, no dullness to percussion, clear without rales or rhonchi  Cardiovascular: RRR, heart sounds normal, no murmur or gallops, no peripheral edema  Musculoskeletal: No deformities, no cyanosis or clubbing  Neuro: alert, non focal  Skin: Warm, no lesions or rashes  02/23/15 --  Reviewed by me personally COMPARISON: 01/01/2015  FINDINGS: Mediastinum: Normal heart size. There is no pericardial effusion identified. Calcified atherosclerotic disease noted involving the LAD Coronary artery. The trachea is patent and appears midline. There is a normal appearance of the esophagus. No mediastinal or hilar lymph nodes identified.  Lungs/Pleura: No pleural effusion noted. 5 mm right upper lobe nodule is unchanged from previous exam, image 17 of series 3. Within the basilar portion of a right middle lobe there is a stable scar like opacity, image 30/series 3. No new pulmonary nodules identified.  Upper Abdomen: Right hepatic lobe hemangioma is again noted, image 62/series 2. The visualized portions of the adrenal glands and spleen are normal.  Musculoskeletal: There are no aggressive lytic or sclerotic bone lesions identified. There is a mild scoliosis deformity and multi level degenerative disc disease.  IMPRESSION: 1. No acute cardiopulmonary abnormalities. 2. Stable 5 mm right upper lobe nodule. If this patient is at increased risk for lung cancer then the next followup exam should be obtained If the patient is at high risk for bronchogenic carcinoma, follow-up chest CT at 6-12 months is recommended. If the patient is at low risk for bronchogenic carcinoma, follow-up chest CT at 12 months is  recommended. This recommendation follows the consensus statement: Guidelines for Management of Small Pulmonary Nodules Detected on CT Scans: A Statement from the [place] as published in Radiology 2005;237:395-400       Assessment & Plan:  Asthma, allergic Managed with albuterol when necessary. As long as her GERD and her allergic rhinitis are well controlled she has not needed schedule bronchodilators. I'll follow with her in 6 months or sooner if necessary  Allergic rhinitis Continue nasal saline washes, nasal steroid, loratadine. She is following with Dr. Wilburn Cornelia with ENT  GERD (gastroesophageal reflux disease) This is another component of her cough. It appears to be flaring recently. She is on omeprazole twice a day. She has plans to follow-up with gastroenterology soon.  Pulmonary nodules Not seen on her most recent CT scan of the chest. I suspect that this is benign lesion and I would like for her to have one final CT scan of the chest done in January 2018.

## 2016-03-02 NOTE — Patient Instructions (Addendum)
Repeat CT scan of the chest in January 2018 Please continue your omeprazole as you are taking it Please continue loratadine, nasal washes, nasal spray as you are taking them  Follow with GI and ENT as planned.  Keep albuterol available to use as needed for shortness of breath.  Follow with Dr Lamonte Sakai in 6 months or sooner if you have any problems

## 2016-03-02 NOTE — Assessment & Plan Note (Signed)
This is another component of her cough. It appears to be flaring recently. She is on omeprazole twice a day. She has plans to follow-up with gastroenterology soon.

## 2016-03-02 NOTE — Assessment & Plan Note (Signed)
Continue nasal saline washes, nasal steroid, loratadine. She is following with Dr. Wilburn Cornelia with ENT

## 2016-03-23 ENCOUNTER — Ambulatory Visit (INDEPENDENT_AMBULATORY_CARE_PROVIDER_SITE_OTHER): Payer: Medicare Other | Admitting: Gastroenterology

## 2016-03-23 ENCOUNTER — Encounter: Payer: Self-pay | Admitting: Gastroenterology

## 2016-03-23 VITALS — BP 116/66 | HR 74 | Ht 64.5 in | Wt 117.0 lb

## 2016-03-23 DIAGNOSIS — Z86010 Personal history of colonic polyps: Secondary | ICD-10-CM

## 2016-03-23 DIAGNOSIS — K219 Gastro-esophageal reflux disease without esophagitis: Secondary | ICD-10-CM

## 2016-03-23 DIAGNOSIS — R1310 Dysphagia, unspecified: Secondary | ICD-10-CM

## 2016-03-23 NOTE — Patient Instructions (Addendum)
You have been scheduled for an esophageal manometry at [facility] on  04/03/2016  at 10:30am. Please arrive 30 minutes prior to your procedure for registration. You will need to go to outpatient registration (1st floor of the hospital) first. Make certain to bring your insurance cards as well as a complete list of medications.  Please remember the following:  STAY ON PPI   1) Nothing to eat or drink after 12:00 midnight on the night before your test.  2) Hold all diabetic medications/insulin the morning of the test. You may eat and take             your medications after the test.  3) Stay on Stomach medication  Prilosec  4) For 2 days prior to your test, do not take: Reglan, Tagamet, Zantac, Axid or Pepcid.  5) You MAY use an antacid such as Rolaids or Tums up to 12 hours prior to your test.  It will take at least 2 weeks to receive the results of this test from your physician. ------------------------------------------ ABOUT ESOPHAGEAL MANOMETRY Esophageal manometry (muh-NOM-uh-tree) is a test that gauges how well your esophagus works. Your esophagus is the long, muscular tube that connects your throat to your stomach. Esophageal manometry measures the rhythmic muscle contractions (peristalsis) that occur in your esophagus when you swallow. Esophageal manometry also measures the coordination and force exerted by the muscles of your esophagus.  During esophageal manometry, a thin, flexible tube (catheter) that contains sensors is passed through your nose, down your esophagus and into your stomach. Esophageal manometry can be helpful in diagnosing some mostly uncommon disorders that affect your esophagus.  Why it's done Esophageal manometry is used to evaluate the movement (motility) of food through the esophagus and into the stomach. The test measures how well the circular bands of muscle (sphincters) at the top and bottom of your esophagus open and close, as well as the pressure,  strength and pattern of the wave of esophageal muscle contractions that moves food along.  What you can expect Esophageal manometry is an outpatient procedure done without sedation. Most people tolerate it well. You may be asked to change into a hospital gown before the test starts.  During esophageal manometry  While you are sitting up, a member of your health care team sprays your throat with a numbing medication or puts numbing gel in your nose or both.  A catheter is guided through your nose into your esophagus. The catheter may be sheathed in a water-filled sleeve. It doesn't interfere with your breathing. However, your eyes may water, and you may gag. You may have a slight nosebleed from irritation.  After the catheter is in place, you may be asked to lie on your back on an exam table, or you may be asked to remain seated.  You then swallow small sips of water. As you do, a computer connected to the catheter records the pressure, strength and pattern of your esophageal muscle contractions.  During the test, you'll be asked to breathe slowly and smoothly, remain as still as possible, and swallow only when you're asked to do so.  A member of your health care team may move the catheter down into your stomach while the catheter continues its measurements.  The catheter then is slowly withdrawn. The test usually lasts 20 to 30 minutes.  After esophageal manometry  When your esophageal manometry is complete, you may return to your normal activities  This test typically takes 30-45 minutes to complete.  ________________________________________________________________________________ 

## 2016-03-23 NOTE — Progress Notes (Signed)
HPI :  70 y/o female here for follow up visit for dysphagia and GERD. Former patient of Dr. Deatra Ina.   She has a history of dysphagia which had bothered her over the years intermittently. She has had some dilations in the past which provided some short lived benefit. Her last dilation was in 2012, she she felt okay for a few years without significant symptoms, but she has had some intermittent symptoms over the past 2 years which she thinks is getting worse over time. She has dysphagia to both solids and liquids. She feels dysphagia mostly in the sternal notch. She has some trouble swallowing pills as well. She feels this bothers her most times when she eats. She also endorses waking up at night with a sore throat and "foam" in her mouth. She denies any overt pyrosis but has had some prior chest discomfort that has been relieved with a GI cocktail. Seen in the ER in November for this and thought to be due to GERD. She also has regurgitation which requires her to sleep with head of the bed elevated.   She had had 3 dilations for these symptoms between 2011-2012, dilated up to 17mm with only short lived benefit and no lasting relief. She is taking omeprazole 40mg  BID for her reflux and states she also is taking a lot of tums and Rolaids to treat the regurgitation. She has not been tried on other PPIs historically she can remember. She is not losing any weight. She's recently had a CT scan of the chest which looked okay. She had a barium study in 2011 showing no obvious stenosis and brief hangup of 70mm pill in the middle and lower esophagus without clear stenosis.  Mother had colon cancer, diagnosed at age 72 or 53, she also had pancreatic cancer at the time of death in her 52s. I performed the patient's colonoscopy in January and she had one small adenoma removed.   Prior endoscopic evaluation: Colonoscopy 1/11/187 - one small adenoma, melanosis coli EGD - 09/19/11 - stricture at GEJ, dilated to 10mm  ballooon EGD 12/30/10 - normal exam, empiric dilation to 11mm EGD 08/2710 - stricture at GEJ, dilated to 3mm   Past Medical History  Diagnosis Date  . GERD (gastroesophageal reflux disease)   . Arthritis   . Asthma   . Hypertension   . History of colonic polyps   . Fibromyalgia   . S/P radiation therapy 11/12/12 - 12/05/12    right Breast  . CAD (coronary artery disease) CARDIOLOGIST--  DR MCALHANY    mild non-obstructive cad  . COPD (chronic obstructive pulmonary disease) ([place])   . History of breast cancer ONCOLOGIST-- DR Jana Hakim---  NO RECURRANCE    DX 07/2012;  LOW GRADE DCIS  ER+PR+  ----  S/P RIGHT LUMPECTOMY WITH NEGATIVE MARGINS/   RADIATION ENDED 11/2012  . History of chronic bronchitis   . Pelvic pain   . Frequency of urination   . Urgency of urination   . Chronic constipation   . History of tachycardia     CONTROLLED  WITH ATENOLOL  . History of basal cell carcinoma excision     X2  . Sinus headache   . H/O hiatal hernia      Past Surgical History  Procedure Laterality Date  . Nasal sinus surgery  1985  . Cardiovascular stress test  06-18-2012  DR McALHANY    LOW RISK NUCLEAR STUDY/  SMALL FIXED AREA OF MODERATELY DECREASED UPTAKE IN ANTEROSEPTAL WALL  WHICH MAY BE ARTIFACTUAL/  NO ISCHEMIA/  EF 68%  . Transthoracic echocardiogram  06-24-2012    GRADE I DIASTOLIC DYSFUNCTION/  EF 55-60%/  MILD MR  . Right breast bx  08-23-2012  . Removal vocal cord cyst  FEB 2014  . Cardiac catheterization  09-13-2007  DR Lia Foyer    WELL-PRESERVED LVF/  DIFFUSE SCATTERED CORONARY CALCIFACATION AND ATHEROSCLEROSIS WITHOUT OBSTRUCTION  . Cardiac catheterization  08-04-2010  DR [facility]    NON-OBSTRUCTIVE CAD/  pLAD 40%/  oLAD 30%/  mLAD 30%/  pRCA 30%/  EF 60%  . Colonoscopy  09-29-2010  . Breast lumpectomy Right 10-11-2012    W/ SLN BX  . Total abdominal hysterectomy w/ bilateral salpingoophorectomy  1982    W/  APPENDECTOMY  . Tonsillectomy and adenoidectomy  AGE 3  . Orif  right ankle  fx  2006  . Right hand surgery  X3  LAST ONE 2009    INCLUDES  ORIF RIGHT 5TH FINGER AND REVISION TWICE  . Cystoscopy with hydrodistension and biopsy N/A 03/06/2014    Procedure: CYSTOSCOPY/HYDRODISTENSION/ INSTILATION OF MARCAINE AND PYRIDIUM;  Surgeon: Ailene Rud, MD;  Location: [facility];  Service: Urology;  Laterality: N/A;   Family History  Problem Relation Age of Onset  . Rectal cancer Mother   . Cancer Mother     rectal,  breast, pancreatic  . Colon cancer Mother   . Irritable bowel syndrome Son   . Irritable bowel syndrome Daughter   . Heart disease Other   . Arthritis Other   . Cancer Other     breast cancer 1st degree relative <50, lung  . Diabetes Other     1st degree relative   . Hyperlipidemia Other   . Hypertension Other   . Osteoporosis Other   . Coronary artery disease Other 65  . Cancer Maternal Aunt     breast  . Colon cancer Father    Social History  Substance Use Topics  . Smoking status: Former Smoker -- 1.00 packs/day for 15 years    Types: Cigarettes    Quit date: 05/27/2012  . Smokeless tobacco: Never Used  . Alcohol Use: 0.0 oz/week    0 Standard drinks or equivalent per week     Comment: occasional   Current Outpatient Prescriptions  Medication Sig Dispense Refill  . acetaminophen (TYLENOL) 500 MG tablet Take 1,000 mg by mouth every 6 (six) hours as needed for mild pain or headache.     . albuterol (PROVENTIL HFA;VENTOLIN HFA) 108 (90 BASE) MCG/ACT inhaler Inhale 2 puffs into the lungs every 4 (four) hours as needed. 1 Inhaler 5  . aspirin EC 81 MG tablet Take 1 tablet (81 mg total) by mouth daily. 90 tablet 3  . atenolol (TENORMIN) 50 MG tablet Take 1 tablet (50 mg total) by mouth every morning. 90 tablet 1  . DULoxetine (CYMBALTA) 60 MG capsule TAKE 1 BY MOUTH TWICE DAILY 180 capsule 1  . ELMIRON 100 MG capsule Take 1 capsule by mouth 3 (three) times daily as needed. Reported on 01/05/2016    . estradiol  (ESTRACE) 0.1 MG/GM vaginal cream Place 1 Applicatorful vaginally 2 (two) times a week. ALTERNATES WITH ESTRADIAL TABLET    . fluticasone (FLONASE) 50 MCG/ACT nasal spray Place 1 spray into the nose as needed for allergies.     . furosemide (LASIX) 20 MG tablet Take 1 tablet (20 mg total) by mouth every morning. 180 tablet 1  . gabapentin (NEURONTIN) 300  MG capsule Take 1 capsule (300 mg total) by mouth 2 (two) times daily. 60 capsule 6  . loratadine (CLARITIN) 10 MG tablet Take 1 tablet (10 mg total) by mouth daily. 30 tablet 11  . losartan (COZAAR) 25 MG tablet Take 25 mg by mouth daily.    Marland Kitchen omeprazole (PRILOSEC) 40 MG capsule Take 40 mg by mouth 2 (two) times daily.    . Probiotic Product (ACIDOPHILUS PROBIOTIC COMPLEX PO) Take 1 capsule by mouth daily.    . traMADol (ULTRAM) 50 MG tablet Take 1-2 tablets (50-100 mg total) by mouth 3 (three) times daily as needed for moderate pain. 180 tablet 0  . traZODone (DESYREL) 100 MG tablet Take 1 tablet (100 mg total) by mouth at bedtime. 180 tablet 1  . URELLE (URELLE/URISED) 81 MG TABS tablet Take 1 tablet (81 mg total) by mouth 3 (three) times daily. (Patient taking differently: Take 1 tablet by mouth 3 (three) times daily as needed for bladder spasms. ) 30 each 2   No current facility-administered medications for this visit.   Facility-Administered Medications Ordered in Other Visits  Medication Dose Route Frequency Provider Last Rate Last Dose  . bupivacaine (MARCAINE) 0.5 % 10 mL, triamcinolone acetonide (KENALOG-40) 40 mg injection   Subcutaneous Once Carolan Clines, MD      . bupivacaine (MARCAINE) 0.5 % 15 mL, phenazopyridine (PYRIDIUM) 400 mg bladder mixture   Bladder Instillation Once Carolan Clines, MD       Allergies  Allergen Reactions  . Clindamycin Hcl Shortness Of Breath and Rash  . Penicillins Anaphylaxis  . Prednisone Shortness Of Breath and Rash  . Codeine Hives and Other (See Comments)    headache  . Fluzone [Flu  Virus Vaccine] Other (See Comments)    Local reaction at the site  . Latex Hives  . Pentazocine Lactate Other (See Comments)    HALLUCINATION  . Pneumococcal Vaccine Polyvalent Hives, Swelling and Other (See Comments)    REACTION: redness, swelling, and hives at injection site  . Tamoxifen Nausea And Vomiting and Other (See Comments)    HEADACHE     Review of Systems: All systems reviewed and negative except where noted in HPI.    Ct Chest Wo Contrast  02/28/2016  CLINICAL DATA:  Routine follow-up pulmonary nodule EXAM: CT CHEST WITHOUT CONTRAST TECHNIQUE: Multidetector CT imaging of the chest was performed following the standard protocol without IV contrast. COMPARISON:  02/23/2015 FINDINGS: Mediastinum/Nodes: Heart is normal size. Aorta is normal caliber. Dense coronary artery calcifications in the left anterior descending coronary artery. No mediastinal, hilar, or axillary adenopathy. Lungs/Pleura: Areas of scarring in the apices and upper lobes bilaterally. Previously seen peripheral right upper lobe nodule not visualized on today's study. Scarring in the right middle lobe. No new or enlarging pulmonary nodules. No pleural effusions. Upper abdomen: Imaging into the upper abdomen shows no acute findings. Musculoskeletal: Chest wall soft tissues are unremarkable. No acute bony abnormality or focal bone lesion sclerotic focus anteriorly within a mid thoracic vertebral body is stable, likely bone island. Sclerotic focus within a lateral mid right rib is stable since prior chest CT. This area did not show increased uptake on prior bone scan and is felt represent old healed fracture. IMPRESSION: Previously seen 5 mm right upper lobe nodule no longer visualized. There is biapical/ upper lobe scarring peripherally. No new or enlarging pulmonary nodules. Coronary artery disease. Sclerotic foci within the mid thoracic spine and of a right lateral rib is unchanged. These 2 areas  were not hot on prior  nuclear medicine bone scan and therefore felt to be benign. Electronically Signed   By: Rolm Baptise M.D.   On: 02/28/2016 09:22    Lab Results  Component Value Date   WBC 5.0 04/20/2015   HGB 11.3* 04/20/2015   HCT 36.0 04/20/2015   MCV 90.9 04/20/2015   PLT 221 04/20/2015    Lab Results  Component Value Date   CREATININE 1.1 04/20/2015   BUN 13.1 04/20/2015   NA 140 04/20/2015   K 3.9 04/20/2015   CL 102 03/01/2015   CO2 26 04/20/2015    Lab Results  Component Value Date   ALT 16 04/20/2015   AST 23 04/20/2015   ALKPHOS 87 04/20/2015   BILITOT 0.26 04/20/2015     Physical Exam: BP 116/66 mmHg  Pulse 74  Ht 5' 4.5" (1.638 m)  Wt 117 lb (53.071 kg)  BMI 19.78 kg/m2 Constitutional: Pleasant,well-developed, female in no acute distress. HEENT: Normocephalic and atraumatic. Conjunctivae are normal. No scleral icterus. Neck supple.  Cardiovascular: Normal rate, regular rhythm.  Pulmonary/chest: Effort normal and breath sounds normal. No wheezing, rales or rhonchi. Abdominal: Soft, nondistended, nontender. Bowel sounds active throughout. There are no masses palpable. No hepatomegaly. Extremities: no edema Lymphadenopathy: No cervical adenopathy noted. Neurological: Alert and oriented to person place and time. Skin: Skin is warm and dry. No rashes noted. Psychiatric: Normal mood and affect. Behavior is normal.   ASSESSMENT AND PLAN: 70 y/o female with PMH as outlined above here for follow up for the following issues:  Dysphagia - longstanding and intermittent, with dysphagia to both solids and liquids. Mild stenosis of the GEJ noted historically, unclear if this is pathologic or not, she has not had lasting benefit from dilations. Barium study showed the pill getting hung up in the middle and lower esophagus without clear stenosis. Given her history of these symptoms and her ongoing reflux, I am concerned about esophageal motility disorder causing her symptoms. I  recommend an esophageal manometry to evaluate this symptoms at present time. I discussed the risks / benefits with her and what this study entailed and she agreed to proceed, further recommendations pending the results. We may also consider modified barium study to assess for oropharyngeal dysphagia pending her course. If both of these are negative we can consider repeat EGD with empiric dilation but this has not seemed to provide any lasting relief historically.    GERD - ongoing regurgitation is her main symptom despite BID PPI and TUMs/Rolaids. She has no pyrosis. I suspect she may have nonacid reflux causing these symptoms or perhaps related to any underlying esophageal dysmotility if she has this. Recommend 24 HR pH impedance testing while ON BID PPI to further evaluate these symptoms. Further recommendations pending this result, she agreed. She is otherwise on high dose PPI and discussed the risks of PPIs. The risks of long term PPIs with current data include increased risk for chronic kidney disease, increased risk of fracture, increased risk of C diff, increased risk of pneumonia (short term usage), potentially increased risk of B12 / calcium deficiency, and rare risk of hypomagnesemia. Recent studies have also shown an association with increased risk of dementia and cardiovascular outcomes including stroke. These studies have showed an association between PPIs and several of these outcomes but no evidence of causality. The patient was counseled to use the lowest daily use of PPI needed to control symptoms. I would recommend checking renal function periodically while taking this class  of medication.   History of small adenoma Jan 2017 - due for recall colonoscopy in 2022 if she wishes to continue surveillance colonoscopy at that time.   Browns Cellar, MD [facility] Gastroenterology Pager [phone]

## 2016-04-03 ENCOUNTER — Encounter (HOSPITAL_COMMUNITY)
Admission: AD | Disposition: A | Discharge status: Home / Self Care | Payer: Self-pay | Source: Ambulatory Visit | Attending: Gastroenterology

## 2016-04-03 ENCOUNTER — Ambulatory Visit (HOSPITAL_COMMUNITY)
Admission: AD | Admit: 2016-04-03 | Discharge: 2016-04-03 | Disposition: A | Discharge status: Home / Self Care | Payer: Medicare Other | Source: Ambulatory Visit | Attending: Gastroenterology | Admitting: Gastroenterology

## 2016-04-03 DIAGNOSIS — K219 Gastro-esophageal reflux disease without esophagitis: Secondary | ICD-10-CM | POA: Diagnosis not present

## 2016-04-03 DIAGNOSIS — R1310 Dysphagia, unspecified: Secondary | ICD-10-CM | POA: Insufficient documentation

## 2016-04-03 HISTORY — PX: ESOPHAGEAL MANOMETRY: SHX5429

## 2016-04-03 HISTORY — PX: 24 HOUR PH STUDY: SHX5419

## 2016-04-03 SURGERY — MANOMETRY, ESOPHAGUS

## 2016-04-03 MED ORDER — LIDOCAINE VISCOUS 2 % MT SOLN
OROMUCOSAL | Status: AC
Start: 2016-04-03 — End: 2016-04-03
  Filled 2016-04-03: qty 15

## 2016-04-03 SURGICAL SUPPLY — 2 items
FACESHIELD LNG OPTICON STERILE (SAFETY) IMPLANT
GLOVE BIO SURGEON STRL SZ8 (GLOVE) ×4 IMPLANT

## 2016-04-03 NOTE — Progress Notes (Signed)
Esophageal Manometry done per protocol. Pt tolerated well. [facility] with impedance done per protocol. Pt instructed on procedure and when to return. Pt verbalized understanding and will return 04/04/16 at 1130 to have probe removed and study downloaded. Reports will be sent to Dr. Doyne Keel office.

## 2016-04-05 ENCOUNTER — Encounter (HOSPITAL_COMMUNITY): Payer: Self-pay | Admitting: Gastroenterology

## 2016-04-12 ENCOUNTER — Telehealth: Payer: Self-pay | Admitting: Gastroenterology

## 2016-04-12 DIAGNOSIS — R1310 Dysphagia, unspecified: Secondary | ICD-10-CM | POA: Insufficient documentation

## 2016-04-12 DIAGNOSIS — K219 Gastro-esophageal reflux disease without esophagitis: Secondary | ICD-10-CM | POA: Insufficient documentation

## 2016-04-12 NOTE — Telephone Encounter (Signed)
Called patient and discussed results as outlined. I think diltiazem would be a reasonable option for her - she has dysphagia with some discomfort when eating. She is on atenolol however and would need to touch base with the ordering provider to see if we could switch it to diltiazem. In the interim, she is on vacation and will try taking peppermint altoids to see if this helps, take 2 tabs prior to meals. If this works for her, may be able to hold off on diltiazem. She will call if she wishes to try it however, all questions answered. She can follow up as needed.

## 2016-04-12 NOTE — Telephone Encounter (Signed)
Is very worried because she received a call from physician regarding her test results. She tells me: "Dr's don't usually call patient with results unless they are bad news". She just got to the beach with some friends and feels her trip might be a sad one now if the results are bad. Says she heard the part in the message that she will get a call back later but she is very very anxious. Is kindly requesting call back sooner rather than later??

## 2016-04-12 NOTE — Telephone Encounter (Signed)
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 This could be causing her symptoms. Recommend trial of diltiazem (60-90mg  up to QID), peppermint oil supplement (Altoids), Sildenafil, or TCA, to see if this helps The 24 HR pH impedance study shows a Demeester score of 10, with a symptom index of 0. Of all reported symptoms, none were correlated to reflux. I would continue PPI for now, perhaps her symptoms are due to nutcracker esophagus. She will be contacted with results and recommendations

## 2016-05-02 ENCOUNTER — Telehealth: Payer: Self-pay | Admitting: Gastroenterology

## 2016-05-02 NOTE — Telephone Encounter (Signed)
Left a message for patient to call back. 

## 2016-05-04 NOTE — Telephone Encounter (Signed)
Patient did not call back.   

## 2016-05-12 DIAGNOSIS — J343 Hypertrophy of nasal turbinates: Secondary | ICD-10-CM

## 2016-05-12 DIAGNOSIS — J342 Deviated nasal septum: Secondary | ICD-10-CM

## 2016-05-12 HISTORY — DX: Deviated nasal septum: J34.2

## 2016-05-12 HISTORY — DX: Hypertrophy of nasal turbinates: J34.3

## 2016-05-16 ENCOUNTER — Telehealth: Payer: Self-pay | Admitting: Gastroenterology

## 2016-05-16 NOTE — Telephone Encounter (Signed)
Patient was requesting "biopsy results"  I did explain that there were no biopsies with the manometry.  She also reports that the peppermint Altoids are not helping.  She reports pain in her chest with pain "sometimes radiates to my back".  She reports "pain more on the sides not right down the center".  She is asking for the next step from here.  Dr. Havery Moros please advise

## 2016-05-17 ENCOUNTER — Other Ambulatory Visit: Payer: Self-pay | Admitting: Gastroenterology

## 2016-05-17 DIAGNOSIS — K224 Dyskinesia of esophagus: Secondary | ICD-10-CM

## 2016-05-17 MED ORDER — DILTIAZEM HCL 60 MG PO TABS: ORAL_TABLET | ORAL | Status: DC

## 2016-05-17 NOTE — Telephone Encounter (Signed)
Called and left a message, no answer. Would recommend trial of diltiazem if symptoms are persisting.

## 2016-05-17 NOTE — Telephone Encounter (Signed)
Patient called back. She reports altoids have helped somewhat and they do provide benefit, but still having some symptoms. I discussed options with her for treatment of nutcracker esophagus which I suspect is likely causing her symptoms. Will try diltiazem 60mg  BID to start, and can titrate up to TID or QID as tolerated pending her course. She is taking atenolol 50mg  daily at present time and recommend she stop this if starting diltiazem. She should call her primary care to ensure its okay to do this from their perspective. I counseled her in diltiazem and she wished to proceed. It will be ordered to the pharmacy, she can call for follow up or reassesssment as needed.

## 2016-05-18 ENCOUNTER — Telehealth: Payer: Self-pay | Admitting: *Deleted

## 2016-05-18 NOTE — Telephone Encounter (Signed)
Spoke with pt, Aware of dr crenshaw's recommendations.  °

## 2016-05-18 NOTE — Telephone Encounter (Signed)
Ok to dc atenolol and begin cardizem [employer]

## 2016-05-18 NOTE — Telephone Encounter (Signed)
[name] called back. She'd like to have Dr. Stanford Breed verify whether OK to stop her atenolol 25mg  daily and replace w/ diltiazem 60mg  BID [to titrate-> TID after 2 weeks] as instructed by Dr. Havery Moros. Pt is being recommended to take the diltiazem as a treatment for nutcracker esophagus.  Pt aware I will send to Dr. Stanford Breed for his review.

## 2016-05-18 NOTE — Telephone Encounter (Signed)
Jovona called and left msg on my machine. She is a patient of Dr. Stanford Breed last seen Feb 2016.  She explains in her message that she saw Dr. Havery Moros recently, who took her off atenolol and started diltiazem due to nutcracker esophagus.  Wants to know if OK to take. Called to give indications, no answer when dialed. Left patient msg to call back

## 2016-05-30 ENCOUNTER — Telehealth: Payer: Self-pay | Admitting: Nurse Practitioner

## 2016-05-30 NOTE — Telephone Encounter (Signed)
Left message for patient confirming 6/7 LTS visit - patient also mychart active.

## 2016-05-31 ENCOUNTER — Encounter: Payer: Self-pay | Admitting: Nurse Practitioner

## 2016-05-31 ENCOUNTER — Telehealth: Payer: Self-pay | Admitting: Nurse Practitioner

## 2016-05-31 ENCOUNTER — Ambulatory Visit (HOSPITAL_BASED_OUTPATIENT_CLINIC_OR_DEPARTMENT_OTHER): Payer: Medicare Other | Admitting: Nurse Practitioner

## 2016-05-31 VITALS — BP 130/68 | HR 81 | Temp 98.4°F | Resp 18 | Ht 64.5 in | Wt 118.0 lb

## 2016-05-31 DIAGNOSIS — C50911 Malignant neoplasm of unspecified site of right female breast: Secondary | ICD-10-CM

## 2016-05-31 DIAGNOSIS — Z86000 Personal history of in-situ neoplasm of breast: Secondary | ICD-10-CM

## 2016-05-31 NOTE — Patient Instructions (Signed)
Thank you for coming in today!  As we discussed, please continue to perform your self breast exam and report any changes. If you note any new symptoms (please see below), be sure to notify us ASAP.  Your mammogram will be due in August 2017 and we will enter orders for it today.  We also will attempt to coordinate scheduling of your MRI as discussed with you with Dr. Jana Hakim at your last visit. We'll have you return in one year's time for your next appointment or sooner if you have any problems. Please be sure to stop by scheduling on your way out to make those appointment(s).  Looking forward to working with you in the future!  Let us know if you have any questions!  Symptoms to Watch for and Report to Your Provider  . Return of the cancer symptoms you had before- such as a lump or new growth where your cancer first started . New or unusual pain that seems unrelated to an injury and does not go away, including back pain or bone pain . Weight loss without trying/intending . Unexplained bleeding . A rash or allergic reaction, such as swelling, severe itching or wheezing . Chills or fevers . Persistent headaches . Shortness of breath or difficulty breathing . Bloody stools or blood in your urine . Lumps, bumps, swelling and/or nipple discharge . Nausea, vomiting, diarrhea, loss of appetite, or trouble swallowing . A cough that doesn't go away . Abdominal pain . Swelling in your arms or legs . Fractures . Hot flashes or other menopausal symptoms . Any other signs mentioned by your doctor or nurse or any unusual symptoms                 that you just can't explain   NOTE: Just because you have certain symptoms, it doesn't mean the cancer has come back or you have a new cancer. Symptoms can be due to other problems that need to be addressed.  It is important to watch for these symptoms and report them to your provider so you can be medically evaluated for any of these concerns!     Living a Life  of Wellness After Cancer:  *Note: Please consult your health care provider before using any medications, supplements, over-the-counter products, or other interventions.  Also, please consult your primary care provider before you begin any lifestyle program (diet, exercise, etc.).  Your safety is our top priority and we want to make sure you continue to live a long and healthy life!    Healthy Lifestyle Recommendations  As a cancer survivor, it is important develop a lifelong commitment to a healthy lifestyle. A healthy lifestyle can prevent cancer from returning as well as prevent other diseases like heart disease, diabetes and high blood pressure.  These are some things that you can do to have a healthy lifestyle:  Marland Kitchen Maintain a healthy weight.  . Exercise daily per your doctor's orders. . Eat a balanced diet high in fruits, vegetables, bran, and fiber. Limit intake of red meat      and processed foods.  . Limit how much alcohol you consume, if at all. Ali Lowe regular bone mineral density testing for osteoporosis.  . Talk to your doctor about cardiovascular disease or "heart disease" screening. . Stop smoking (if you smoke). . Know your family history. . Be mindful of your emotional, social, and spiritual needs. . Meet regularly with a Primary Care Provider (PCP). Find a PCP if you do  not             already have one. . Talk to your doctor about regular cancer screening including screening for colon           cancer, GYN cancers, and skin cancer.

## 2016-05-31 NOTE — Telephone Encounter (Signed)
Gave pt apt & avs... Still awaiting imaging orders to schedule

## 2016-05-31 NOTE — Progress Notes (Signed)
CLINIC:  Cancer Survivorship   REASON FOR VISIT:  Routine follow-up post-treatment for history of breast cancer.  BRIEF ONCOLOGIC HISTORY:    Breast cancer, right breast ([place])   10/11/2012 Definitive Surgery Right lumpectomy: DCIS, low grade, ER+, PR+, negative margins   10/11/2012 Pathologic Stage Stage 0: Tis N0    - 12/05/2012 Radiation Therapy Adjuvant RT:   11/2012 - 02/2013 Anti-estrogen oral therapy Tamoxifen 20 mg; poorly tolerated and discontinued    INTERVAL HISTORY:  [name] presents to the [facility] today for ongoing follow up regarding her history of breast cancer. Overall, [name] reports doing well since her last visit with Dr. Jana Hakim in May 2016 from a breast perspective. She denies any mass or lesion in her breast.  She has not yet had her MRI of the breast as recommended at her last visit with Dr. Jana Hakim.  She has seen Dr. Lamonte Sakai in pulmonology and her last CT showed stability of her lung nodules.  He recommended a repeat scan in  January 2018 for which she is currently scheduled.  She underwent colonoscopy in January 2017 which was unremarkable.  She has been undergoing PT for back pain under the direction of Dr. Rita Ohara and had a bone scan done following her last visit Dr. Jana Hakim, which was unremarkable.  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  She has been in discussion with her PCP and cardiologist about this as it relates to her other medications and is a bit frustrated by all the medications she is on and their resulting side effects.  She continues with various aches and pains related to her fibromyalgia but  denies any headache, cough, or prolonged shortness of breath. She reports a fair appetite and denies any weight loss.    REVIEW OF SYSTEMS:  General: Night sweats, fatigue, and generalized pain  which is unchanged.   HEENT: Occasional blurred vision and ringing in ears.  Runny nose and sinus problems.  Some difficulty swallowing due to Nutcracker esophagus. Cardiac: Irregular heartbeat per patient. Occasional palpitations. Denies  lower extremity edema.  Respiratory: Occasional shortness of breath related to the weather per patient. Denies wheeze or dyspnea on exertion.  Breast: As above.  GI: Heartburn, otherwise, denies abdominal pain, constipation, diarrhea, nausea, or vomiting.  [id number] dribbling of urine. Denies dysuria, hematuria, vaginal bleeding, vaginal discharge, or vaginal dryness.  Musculoskeletal: As above. Neuro: Denies recent fall or numbness / tingling in her extremities.  Skin: Denies rash, pruritis, or open wounds.  Psych: Denies depression, anxiety, insomnia, or memory loss.   A 14-point review of systems was completed and was negative, except as noted above.   ONCOLOGY TREATMENT TEAM:  1. Surgeon:  Dr. Lucia Gaskins at [facility]  2. Medical Oncologist: Dr. Jana Hakim 3. Radiation Oncologist: Dr. Isidore Moos    PAST MEDICAL/SURGICAL HISTORY:  Past Medical History  Diagnosis Date  . GERD (gastroesophageal reflux disease)   . Arthritis   . Asthma   . Hypertension   . History of colonic polyps   . Fibromyalgia   . S/P radiation therapy 11/12/12 - 12/05/12    right Breast  . CAD (coronary artery disease) CARDIOLOGIST--  DR MCALHANY    mild non-obstructive cad  . COPD (chronic obstructive pulmonary disease) ([place])   . History of breast cancer ONCOLOGIST-- DR Jana Hakim---  NO RECURRANCE  DX 07/2012;  LOW GRADE DCIS  ER+PR+  ----  S/P RIGHT LUMPECTOMY WITH NEGATIVE MARGINS/   RADIATION ENDED 11/2012  . History of chronic bronchitis   . Pelvic pain   . Frequency of urination   . Urgency of urination   . Chronic constipation   . History of tachycardia     CONTROLLED  WITH ATENOLOL  . History of basal cell carcinoma excision     X2  . Sinus  headache   . H/O hiatal hernia    Past Surgical History  Procedure Laterality Date  . Nasal sinus surgery  1985  . Cardiovascular stress test  06-18-2012  DR McALHANY    LOW RISK NUCLEAR STUDY/  SMALL FIXED AREA OF MODERATELY DECREASED UPTAKE IN ANTEROSEPTAL WALL WHICH MAY BE ARTIFACTUAL/  NO ISCHEMIA/  EF 68%  . Transthoracic echocardiogram  06-24-2012    GRADE I DIASTOLIC DYSFUNCTION/  EF 55-60%/  MILD MR  . Right breast bx  08-23-2012  . Removal vocal cord cyst  FEB 2014  . Cardiac catheterization  09-13-2007  DR Lia Foyer    WELL-PRESERVED LVF/  DIFFUSE SCATTERED CORONARY CALCIFACATION AND ATHEROSCLEROSIS WITHOUT OBSTRUCTION  . Cardiac catheterization  08-04-2010  DR [facility]    NON-OBSTRUCTIVE CAD/  pLAD 40%/  oLAD 30%/  mLAD 30%/  pRCA 30%/  EF 60%  . Colonoscopy  09-29-2010  . Breast lumpectomy Right 10-11-2012    W/ SLN BX  . Total abdominal hysterectomy w/ bilateral salpingoophorectomy  1982    W/  APPENDECTOMY  . Tonsillectomy and adenoidectomy  AGE 49  . Orif right ankle  fx  2006  . Right hand surgery  X3  LAST ONE 2009    INCLUDES  ORIF RIGHT 5TH FINGER AND REVISION TWICE  . Cystoscopy with hydrodistension and biopsy N/A 03/06/2014    Procedure: CYSTOSCOPY/HYDRODISTENSION/ INSTILATION OF MARCAINE AND PYRIDIUM;  Surgeon: Ailene Rud, MD;  Location: [facility];  Service: Urology;  Laterality: N/A;  . Esophageal manometry N/A 04/03/2016    Procedure: ESOPHAGEAL MANOMETRY (EM);  Surgeon: Manus Gunning, MD;  Location: [facility];  Service: Gastroenterology;  Laterality: N/A;  . 24 hour ph study N/A 04/03/2016    Procedure: [place] STUDY;  Surgeon: Manus Gunning, MD;  Location: [facility];  Service: Gastroenterology;  Laterality: N/A;     ALLERGIES:  Allergies  Allergen Reactions  . Clindamycin Hcl Shortness Of Breath and Rash  . Penicillins Anaphylaxis  . Prednisone Shortness Of Breath and Rash  . Codeine Hives and Other (See  Comments)    headache  . Fluzone [Flu Virus Vaccine] Other (See Comments)    Local reaction at the site  . Latex Hives  . Pentazocine Lactate Other (See Comments)    HALLUCINATION  . Pneumococcal Vaccine Polyvalent Hives, Swelling and Other (See Comments)    REACTION: redness, swelling, and hives at injection site  . Tamoxifen Nausea And Vomiting and Other (See Comments)    HEADACHE     CURRENT MEDICATIONS:  Current Outpatient Prescriptions on File Prior to Visit  Medication Sig Dispense Refill  . acetaminophen (TYLENOL) 500 MG tablet Take 1,000 mg by mouth every 6 (six) hours as needed for mild pain or headache.     . albuterol (PROVENTIL HFA;VENTOLIN HFA) 108 (90 BASE) MCG/ACT inhaler Inhale 2 puffs into the lungs every 4 (four) hours as needed. 1 Inhaler 5  . aspirin EC 81 MG tablet Take 1 tablet (81 mg total) by mouth  daily. 90 tablet 3  . diltiazem (CARDIZEM) 60 MG tablet 1 tab twice daily for 2 weeks, can titrate up to three times daily as tolerated 90 tablet 1  . DULoxetine (CYMBALTA) 60 MG capsule TAKE 1 BY MOUTH TWICE DAILY 180 capsule 1  . ELMIRON 100 MG capsule Take 1 capsule by mouth 3 (three) times daily as needed. Reported on 01/05/2016    . estradiol (ESTRACE) 0.1 MG/GM vaginal cream Place 1 Applicatorful vaginally 2 (two) times a week. ALTERNATES WITH ESTRADIAL TABLET    . fluticasone (FLONASE) 50 MCG/ACT nasal spray Place 1 spray into the nose as needed for allergies.     . furosemide (LASIX) 20 MG tablet Take 1 tablet (20 mg total) by mouth every morning. 180 tablet 1  . gabapentin (NEURONTIN) 300 MG capsule Take 1 capsule (300 mg total) by mouth 2 (two) times daily. 60 capsule 6  . loratadine (CLARITIN) 10 MG tablet Take 1 tablet (10 mg total) by mouth daily. 30 tablet 11  . losartan (COZAAR) 25 MG tablet Take 25 mg by mouth daily.    Marland Kitchen omeprazole (PRILOSEC) 40 MG capsule Take 40 mg by mouth 2 (two) times daily.    . Probiotic Product (ACIDOPHILUS PROBIOTIC COMPLEX PO)  Take 1 capsule by mouth daily.    . traMADol (ULTRAM) 50 MG tablet Take 1-2 tablets (50-100 mg total) by mouth 3 (three) times daily as needed for moderate pain. 180 tablet 0  . traZODone (DESYREL) 100 MG tablet Take 1 tablet (100 mg total) by mouth at bedtime. 180 tablet 1  . URELLE (URELLE/URISED) 81 MG TABS tablet Take 1 tablet (81 mg total) by mouth 3 (three) times daily. (Patient taking differently: Take 1 tablet by mouth 3 (three) times daily as needed for bladder spasms. ) 30 each 2   Current Facility-Administered Medications on File Prior to Visit  Medication Dose Route Frequency Provider Last Rate Last Dose  . bupivacaine (MARCAINE) 0.5 % 10 mL, triamcinolone acetonide (KENALOG-40) 40 mg injection   Subcutaneous Once Carolan Clines, MD      . bupivacaine (MARCAINE) 0.5 % 15 mL, phenazopyridine (PYRIDIUM) 400 mg bladder mixture   Bladder Instillation Once Carolan Clines, MD         ONCOLOGIC FAMILY HISTORY:  Family History  Problem Relation Age of Onset  . Rectal cancer Mother   . Cancer Mother     rectal,  breast, pancreatic  . Colon cancer Mother   . Irritable bowel syndrome Son   . Irritable bowel syndrome Daughter   . Heart disease Other   . Arthritis Other   . Cancer Other     breast cancer 1st degree relative <50, lung  . Diabetes Other     1st degree relative   . Hyperlipidemia Other   . Hypertension Other   . Osteoporosis Other   . Coronary artery disease Other 65  . Cancer Maternal Aunt     breast  . Colon cancer Father      GENETIC COUNSELING/TESTING: No   SOCIAL HISTORY:  [name] is married and lives with her spouse in [place], [place].  She has 3 children. 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  She is a former smoker and denies any current or history of illicit drug use.  She uses alcohol on occasion.      PHYSICAL EXAMINATION:  Vital Signs: Filed Vitals:   05/31/16 1446  BP: 130/68  Pulse: 81  Temp: 98.4 F (36.9 C)  Resp: 18   Weight: 118 (down 1 pound since 04/2015) ECOG performance status: 0 General: Well-nourished, well-appearing female in no acute distress.  She is accompanied in clinic by her husband today.   HEENT: Head is atraumatic and normocephalic.  Pupils equal and reactive to light and accomodation. Conjunctivae clear without exudate.  Sclerae anicteric. Oral mucosa is pink, moist, and intact without lesions.  Oropharynx is pink without lesions or erythema.  Lymph: No cervical, supraclavicular, infraclavicular, or axillary lymphadenopathy noted on palpation.  Cardiovascular: Regular rate and rhythm without murmurs, rubs, or gallops. Respiratory: Clear to auscultation bilaterally. Chest expansion symmetric without accessory muscle use on inspiration or expiration.  Breast: Bilateral breast exam performed.  Right lumpectomy scar intact, well healed, without nodularity.  No mass or lesion within either breast.   GI: Abdomen soft and round. No tenderness to palpation. Bowel sounds normoactive in 4 quadrants. No hepatosplenomegaly.   GU: Deferred.  Musculoskeletal: Muscle strength 5/5 in all extremities.   Neuro: No focal deficits. Steady gait.  Psych: Mood and affect normal and appropriate for situation.  Extremities: No edema, cyanosis, or clubbing.  Skin: Warm and dry. No open lesions noted.   LABORATORY DATA:  No results found for this or any previous visit (from the past 2160 hour(s)).      ASSESSMENT AND PLAN:   1. History of breast cancer: Stage 0 ductal carcinoma in situ of the right breast (09/2012), ER positive, PR positive, S/P lumpectomy (09/2012) followed by adjuvant radiation therapy (completed 11/2012) followed by 3 months of adjuvant endocrine therapy discontinued in 02/2013 due to intolerable side effects, followed since that time in a program of surveillance.  [name] is doing well with no clinical symptoms worrisome for cancer recurrence at this  time. I have reviewed the recommendations for ongoing surveillance with her and she will follow-up with her medical oncologist,  Dr. Jana Hakim, in June 2018 with history and physical exam per surveillance protocol and will continue to see Dr. Lucia Gaskins in March of each year. As she has not yet had her annual breast MRI as discussed with Dr. Jana Hakim at her last visit, we will ask that this be scheduled ASAP following today's visit.  She will be due mammogram in August 2017 and we will enter orders for that following today's appointment as well. She was instructed to make [place] aware if she notes any change within her breast, any new symptoms such as pain, shortness of breath, weight loss, or fatigue. She will also maintain close follow up with her PCP. Dr. Maudie Mercury, regarding her other health issues.  We are pleased that she continues to do well now from a cancer perspective almost 4 years following her diagnosis. She has multiple other physical manifestations related to other co-morbid diseases.  After review and physical examination, I do not believe these are related to her history of DCIS.  2. Cancer screening:  Due to [name]'s history and her age, she should receive screening for skin cancers and colon cancer.  She is S/P hysterectomy.  The information and recommendations were shared with the patient and in her written after visit summary.     3. Support services/counseling:  [name] was offered support today through active listening and expressive supportive counseling.  She inquired about and we discussed an opportunity for her to participate in the next session of [facility] ("Finding Your New Normal") support group series designed for patients after they have completed treatment.  Although it has been 3 1/2 years since her initial diagnosis, she could likely benefit from material in the course as well as the support.  She was given information regarding our available services and encouraged to contact me with any  questions or for help enrolling in any of our support group/programs.   A total of 40 minutes of face-to-face time was spent with this patient with greater than 50% of that time in counseling and care-coordination.   Sylvan Cheese, NP  Survivorship Program [place] [phone]   Note: PRIMARY CARE PROVIDER Jani Gravel, [place] [phone]

## 2016-06-01 DIAGNOSIS — Z9181 History of falling: Secondary | ICD-10-CM | POA: Insufficient documentation

## 2016-06-06 ENCOUNTER — Other Ambulatory Visit: Payer: Self-pay | Admitting: Internal Medicine

## 2016-06-06 DIAGNOSIS — R3129 Other microscopic hematuria: Secondary | ICD-10-CM

## 2016-06-30 ENCOUNTER — Ambulatory Visit
Admission: RE | Admit: 2016-06-30 | Discharge: 2016-06-30 | Disposition: A | Discharge status: Home / Self Care | Payer: Medicare Other | Source: Ambulatory Visit | Attending: Internal Medicine | Admitting: Internal Medicine

## 2016-06-30 DIAGNOSIS — R3129 Other microscopic hematuria: Secondary | ICD-10-CM

## 2016-06-30 MED ORDER — IOPAMIDOL (ISOVUE-300) INJECTION 61%
125.0000 mL | Freq: Once | INTRAVENOUS | Status: AC | PRN
  Administered 2016-06-30: 125 mL via INTRAVENOUS

## 2016-07-20 DIAGNOSIS — M79601 Pain in right arm: Secondary | ICD-10-CM | POA: Insufficient documentation

## 2016-07-20 DIAGNOSIS — M79641 Pain in right hand: Secondary | ICD-10-CM | POA: Insufficient documentation

## 2016-07-20 HISTORY — DX: Pain in right arm: M79.601

## 2016-07-26 ENCOUNTER — Ambulatory Visit (INDEPENDENT_AMBULATORY_CARE_PROVIDER_SITE_OTHER): Payer: Medicare Other | Admitting: Neurology

## 2016-07-26 ENCOUNTER — Encounter: Payer: Self-pay | Admitting: Neurology

## 2016-07-26 VITALS — BP 118/76 | HR 69 | Ht 65.0 in | Wt 120.2 lb

## 2016-07-26 DIAGNOSIS — H8110 Benign paroxysmal vertigo, unspecified ear: Secondary | ICD-10-CM | POA: Diagnosis not present

## 2016-07-26 DIAGNOSIS — R42 Dizziness and giddiness: Secondary | ICD-10-CM

## 2016-07-26 DIAGNOSIS — E538 Deficiency of other specified B group vitamins: Secondary | ICD-10-CM

## 2016-07-26 DIAGNOSIS — R29818 Other symptoms and signs involving the nervous system: Secondary | ICD-10-CM | POA: Diagnosis not present

## 2016-07-26 DIAGNOSIS — R2689 Other abnormalities of gait and mobility: Secondary | ICD-10-CM

## 2016-07-26 DIAGNOSIS — R413 Other amnesia: Secondary | ICD-10-CM | POA: Diagnosis not present

## 2016-07-26 NOTE — Progress Notes (Signed)
[facility]    Provider:  Dr Jaynee Eagles Referring Provider: Jani Gravel, MD Primary Care Physician:  Jani Gravel, MD  CC:   Memory problems, balance issues, vertigo, falls, tremor, fibromyalgia, bilateral CTS  HPI:  [name] is a 70 y.o. female here as a referral from Dr. Maudie Mercury for memory loss. PMHx right breast cancer s/p radiation therapy 2013, chronic pain and Fibromyalgia, HTN, COPD, arthritis, fatigue, hyperlipidemia, insomnia, leg and back pain and lumbar radiculopathy, anxiety, vertigo,  nutcracker esophagus, coronary artery disease. She has had memory loss a year ago, she cant pull out words, words just don;t come, difficulty spelling, she has started forgetting how to get places and gone up the wrong ramp, getting lost. Slowly progressive. She fell and hit her head 31/2 weeks ago and hit it really hard. The first time she fell she must missed the seat. The second time she just fell, doesn't know if the hip went out, she was not clear headed. She discusses balance problems, not doing yoga, she has hip and back problems.   Memory: Memory is slowly progressive. She is getting very anxious about it. Difficulty carrying on a conversation. There is also some confusion. Aunts with dementia. Difficulty spelling. She may have had neurocognitive testing in the past as she remembers spo maybe she has had complaints for longer. She forgets things, can;t get words out. No Fhx memory loss.   Vertigo: the morning before she got up she felt dizzy. Balance problems, she has had difficulty with vision since cataract surgery. The morning before she fell. She got up and opened her eyes and had some room spinning, if she moved the room was spinning. Laying in bed the light was spinning. She has had vertigo before, she was seen by therapy in the past. She was diagnosed with BPPV and after they did exercise. Moving eyes to right bothers her.   Reviewed notes, labs and imaging from outside  physicians, which showed:  B12 and TSH normal 02/2015. Hgba1c 5.8 02/2015.   MRI of the brain 09/2015: personally reviewed images and agree with the following. Would also add some generalized mild atrophy more pronounced in the mesial temporal areas.   FINDINGS: There is no evidence of acute infarct, intracranial hemorrhage, mass, midline shift, or extra-axial fluid collection. Ventricles and sulci are within normal limits for age. Punctate foci of T2 hyperintensity are again seen scattered throughout the subcortical and deep cerebral white matter bilaterally, not significantly changed and nonspecific but compatible with mild chronic small vessel ischemic disease.  Prior bilateral cataract extraction is noted. Paranasal sinuses and mastoid air cells are clear. Major intracranial vascular flow voids are preserved.  IMPRESSION: 1. No acute intracranial abnormality or mass. 2. Mild chronic small vessel ischemic disease.  Reviewed notes from primary care. 4 months ago developed numbness of hands. Fingers of both hands are equally affected. Also has a toes. Says they're always cold. She still has strength in the hands. Has a history of bulging disc. She has seen a neurologist in the past Dr. Sabra Heck and was diagnosed with CTS. She says she clenches her hands. Says her memory is terrible. Also reports some memory loss, there was a full where she hurt her shoulder, had vertigo hit her head, noticed some memory loss even before this fall. She tried bilateral wrist immobilizers at nighttime. Cold misdiagnosed for Raynaud's. Asked for evaluation of bilateral hand numbness and memory loss. Scheduled for vestibular therapy for vertigo. Status post fall asked to start  using a cane to ambulate.    Review of Systems: Patient complains of symptoms per HPI as well as the following symptoms: Chills, fatigue, blurred vision, loss of vision, snoring, feeling cold, flushing, ringing in ears, spinning sensation,  trouble swallowing, constipation, joint pain, joint swelling, cramps, aching muscles, memory loss, headache, numbness, weakness, slurred speech, difficulty swallowing, dizziness, tremor, insomnia, snoring, restless legs, depression, anxiety, racing thoughts. Pertinent negatives per HPI. All others negative.   Social History   Social History  . Marital status: Married    Spouse name: Jori Moll   . Number of children: 3  . Years of education: BA   Occupational History  . Retired Therapist, sports Retired   Social History Main Topics  . Smoking status: Former Smoker    Packs/day: 1.00    Years: 15.00    Types: Cigarettes    Quit date: 05/27/2012  . Smokeless tobacco: Never Used  . Alcohol use 0.0 oz/week     Comment: occasional  . Drug use: No  . Sexual activity: Not on file   Other Topics Concern  . Not on file   Social History Narrative   Lives with husband.   Caffeine use: 1/2 cup per day   Exercise-- 2days a week [employer],  Water aerobics, walking     Family History  Problem Relation Age of Onset  . Rectal cancer Mother   . Cancer Mother     rectal,  breast, pancreatic  . Colon cancer Mother   . Cancer Maternal Aunt     breast  . Irritable bowel syndrome Son   . Irritable bowel syndrome Daughter   . Heart disease Other   . Arthritis Other   . Cancer Other     breast cancer 1st degree relative <50, lung  . Diabetes Other     1st degree relative   . Hyperlipidemia Other   . Hypertension Other   . Osteoporosis Other   . Coronary artery disease Other 65  . Colon cancer Father     Past Medical History:  Diagnosis Date  . Arthritis   . Asthma   . CAD (coronary artery disease) CARDIOLOGIST--  DR Angelena Form   mild non-obstructive cad  . Chronic constipation   . COPD (chronic obstructive pulmonary disease) ([place])   . Fibromyalgia   . Frequency of urination   . GERD (gastroesophageal reflux disease)   . H/O hiatal hernia   . History of basal cell carcinoma excision    X2  .  History of breast cancer ONCOLOGIST-- DR Jana Hakim---  NO RECURRANCE   DX 07/2012;  LOW GRADE DCIS  ER+PR+  ----  S/P RIGHT LUMPECTOMY WITH NEGATIVE MARGINS/   RADIATION ENDED 11/2012  . History of chronic bronchitis   . History of colonic polyps   . History of tachycardia    CONTROLLED  WITH ATENOLOL  . Hypertension   . Pelvic pain   . S/P radiation therapy 11/12/12 - 12/05/12   right Breast  . Sinus headache   . Urgency of urination     Past Surgical History:  Procedure Laterality Date  . [place] STUDY N/A 04/03/2016   Procedure: [place] STUDY;  Surgeon: Manus Gunning, MD;  Location: [facility];  Service: Gastroenterology;  Laterality: N/A;  . BREAST LUMPECTOMY Right 10-11-2012   W/ SLN BX  . CARDIAC CATHETERIZATION  09-13-2007  DR Lia Foyer   WELL-PRESERVED LVF/  DIFFUSE SCATTERED CORONARY CALCIFACATION AND ATHEROSCLEROSIS WITHOUT OBSTRUCTION  . CARDIAC CATHETERIZATION  08-04-2010  DR MCALHANY   NON-OBSTRUCTIVE CAD/  pLAD 40%/  oLAD 30%/  mLAD 30%/  pRCA 30%/  EF 60%  . CARDIOVASCULAR STRESS TEST  06-18-2012  DR McALHANY   LOW RISK NUCLEAR STUDY/  SMALL FIXED AREA OF MODERATELY DECREASED UPTAKE IN ANTEROSEPTAL WALL WHICH MAY BE ARTIFACTUAL/  NO ISCHEMIA/  EF 68%  . COLONOSCOPY  09-29-2010  . CYSTOSCOPY WITH HYDRODISTENSION AND BIOPSY N/A 03/06/2014   Procedure: CYSTOSCOPY/HYDRODISTENSION/ INSTILATION OF MARCAINE AND PYRIDIUM;  Surgeon: Ailene Rud, MD;  Location: [facility];  Service: Urology;  Laterality: N/A;  . ESOPHAGEAL MANOMETRY N/A 04/03/2016   Procedure: ESOPHAGEAL MANOMETRY (EM);  Surgeon: Manus Gunning, MD;  Location: [facility];  Service: Gastroenterology;  Laterality: N/A;  . NASAL SINUS SURGERY  1985  . ORIF RIGHT ANKLE  FX  2006  . REMOVAL VOCAL CORD CYST  FEB 2014  . RIGHT BREAST BX  08-23-2012  . RIGHT HAND SURGERY  X3  LAST ONE 2009   INCLUDES  ORIF RIGHT 5TH FINGER AND REVISION TWICE  . TONSILLECTOMY AND  ADENOIDECTOMY  AGE 46  . TOTAL ABDOMINAL HYSTERECTOMY W/ BILATERAL SALPINGOOPHORECTOMY  1982   W/  APPENDECTOMY  . TRANSTHORACIC ECHOCARDIOGRAM  06-24-2012   GRADE I DIASTOLIC DYSFUNCTION/  EF 55-60%/  MILD MR    Current Outpatient Prescriptions  Medication Sig Dispense Refill  . acetaminophen (TYLENOL) 500 MG tablet Take 1,000 mg by mouth every 6 (six) hours as needed for mild pain or headache.     . albuterol (PROVENTIL HFA;VENTOLIN HFA) 108 (90 BASE) MCG/ACT inhaler Inhale 2 puffs into the lungs every 4 (four) hours as needed. 1 Inhaler 5  . aspirin EC 81 MG tablet Take 1 tablet (81 mg total) by mouth daily. 90 tablet 3  . DULoxetine (CYMBALTA) 60 MG capsule TAKE 1 BY MOUTH TWICE DAILY 180 capsule 1  . ELMIRON 100 MG capsule Take 1 capsule by mouth 3 (three) times daily as needed. Reported on 01/05/2016    . estradiol (ESTRACE) 0.1 MG/GM vaginal cream Place 1 Applicatorful vaginally 2 (two) times a week. ALTERNATES WITH ESTRADIAL TABLET    . fluticasone (FLONASE) 50 MCG/ACT nasal spray Place 1 spray into the nose as needed for allergies.     . furosemide (LASIX) 20 MG tablet Take 1 tablet (20 mg total) by mouth every morning. 180 tablet 1  . gabapentin (NEURONTIN) 300 MG capsule Take 1 capsule (300 mg total) by mouth 2 (two) times daily. 60 capsule 6  . loratadine (CLARITIN) 10 MG tablet Take 1 tablet (10 mg total) by mouth daily. 30 tablet 11  . losartan (COZAAR) 25 MG tablet Take 25 mg by mouth daily.    Marland Kitchen omeprazole (PRILOSEC) 40 MG capsule Take 40 mg by mouth 2 (two) times daily.    . Probiotic Product (ACIDOPHILUS PROBIOTIC COMPLEX PO) Take 1 capsule by mouth daily.    . traMADol (ULTRAM) 50 MG tablet Take 1-2 tablets (50-100 mg total) by mouth 3 (three) times daily as needed for moderate pain. 180 tablet 0  . traZODone (DESYREL) 100 MG tablet Take 1 tablet (100 mg total) by mouth at bedtime. 180 tablet 1  . URELLE (URELLE/URISED) 81 MG TABS tablet Take 1 tablet (81 mg total) by mouth 3  (three) times daily. (Patient taking differently: Take 1 tablet by mouth 3 (three) times daily as needed for bladder spasms. ) 30 each 2   No current facility-administered medications for this visit.  Facility-Administered Medications Ordered in Other Visits  Medication Dose Route Frequency Provider Last Rate Last Dose  . bupivacaine (MARCAINE) 0.5 % 10 mL, triamcinolone acetonide (KENALOG-40) 40 mg injection   Subcutaneous Once Carolan Clines, MD      . bupivacaine (MARCAINE) 0.5 % 15 mL, phenazopyridine (PYRIDIUM) 400 mg bladder mixture   Bladder Instillation Once Carolan Clines, MD        Allergies as of 07/26/2016 - Review Complete 07/26/2016  Allergen Reaction Noted  . Clindamycin hcl Shortness Of Breath and Rash 04/03/2011  . Penicillins Anaphylaxis 01/11/2010  . Prednisone Shortness Of Breath and Rash 06/16/2011  . Codeine Hives and Other (See Comments) 01/11/2010  . Fluzone [flu virus vaccine] Other (See Comments) 09/29/2014  . Latex Hives 08/29/2012  . Pentazocine lactate Other (See Comments) 01/11/2010  . Pneumococcal vaccine polyvalent Hives, Swelling, and Other (See Comments) 01/31/2011  . Tamoxifen Nausea And Vomiting and Other (See Comments) 03/03/2014    Vitals: BP 118/76 (BP Location: Left Arm, Patient Position: Sitting, Cuff Size: Normal)   Pulse 69   Ht 5\' 5"  (1.651 m)   Wt 120 lb 3.2 oz (54.5 kg)   BMI 20.00 kg/m  Last Weight:  Wt Readings from Last 1 Encounters:  07/26/16 120 lb 3.2 oz (54.5 kg)   Last Height:   Ht Readings from Last 1 Encounters:  07/26/16 5\' 5"  (1.651 m)    Physical exam: Exam: Gen: NAD, conversant, well nourised, well groomed                     CV: RRR, no MRG. No Carotid Bruits. No peripheral edema, warm, nontender Eyes: Conjunctivae clear without exudates or hemorrhage  Neuro: Detailed Neurologic Exam  Speech:    Speech is normal; fluent and spontaneous with normal comprehension.  Cognition:  MMSE - Mini Mental  State Exam 07/26/2016  Orientation to time 4  Orientation to Place 4  Registration 3  Attention/ Calculation 5  Recall 2  Language- name 2 objects 2  Language- repeat 1  Language- follow 3 step command 2  Language- read & follow direction 1  Write a sentence 1  Copy design 0  Total score 25      The patient is oriented to person, place, and time;     recent and remote memory intact;     language fluent;     normal attention, concentration,     fund of knowledge Cranial Nerves:    The pupils are equal, round, and reactive to light. The fundi are normal and spontaneous venous pulsations are present. Visual fields are full to finger confrontation. Extraocular movements are intact. Trigeminal sensation is intact and the muscles of mastication are normal. The palate elevates in the midline. Hearing intact. Voice is normal. Shoulder shrug is normal. The tongue has normal motion without fasciculations.  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 Normal rapid alternating movements.   Gait:    Heel-toe and tandem gait are intact with imbalance  Motor Observation:    No asymmetry, no atrophy, and no involuntary movements noted. Tone:    Normal muscle tone.    Posture:    Posture is normal. normal erect    Strength: mild hio weakness    Strength is V/V in the upper and lower limbs.      Sensation: intact to LT     Reflex Exam:  DTR's:    Deep tendon reflexes in the upper and lower extremities are normal bilaterally.   Toes:    The toes are downgoing bilaterally.   Clonus:    Clonus is absent.      Assessment/Plan:  70 year old with multiple ocmplaints she wants to address today including  Memory problems, balance issues, vertigo, falls, tremor, fibromyalgia, bilateral CTS. Can't cover everything in one visit, will address memory loss today and can only touch on some of the others, will need follow up.    Memory complaints: Likely multifactorial. She did get a 25/30 on MMSE which does indicate some possible moderate memory loss. Also takes multiple medications that can affect memory including: meclizine, Xanax, tramadol, gabapentin, trazodone, duloxetine which can have side effects of memory changes. Also depression and anxiety may contribute. Had a long discussion about memory and these contributing factors. Discussed possible future steps: Will check TSh and B12 today. Discussed neurocognitive testing , CREAD research trial ongoing in our office, PET CT FDG brain scan. They will consider options. Will request prior neurology records.   Vertigo: likely BPPV. May worsen after hitting her head, discussed post-concussive symptoms as well, fall risk use walking aids and watch for falls. Order vestibular therapy. May need PT for balance and safety as well.   Can't address everything today she has multiple neurologic complaints such as vertigo, tremors, falls, follow up in 3 months.     Sarina Ill, MD  [facility] [address] [place], [place] [address]  Phone [phone] Fax [phone]

## 2016-07-26 NOTE — Patient Instructions (Signed)
Remember to drink plenty of fluid, eat healthy meals and do not skip any meals. Try to eat protein with a every meal and eat a healthy snack such as fruit or nuts in between meals. Try to keep a regular sleep-wake schedule and try to exercise daily, particularly in the form of walking, 20-30 minutes a day, if you can.   As far as your medications are concerned, I would like to suggest: Discuss decreasing some of your medications with the physicians who prescribed them, explain memory changes. Again, this may not be the problem but is something to think about however coming off your meds may not be possible due to the conditions they treat. Discuss depression with primary care I fyou feel this could be contributory, depression and anxiety can cause perceived memory changes.  As far as diagnostic testing: Neurocognitive testing, Labs, I will get previous neurologist records, vestibular therapy, consider CREAD study. For balance issues and weakness may consider physical therapy in the future after completing vestibular therapy, discussed Yoga and other classes.   I would like to see you back in 3 months, sooner if we need to. Please call us with any interim questions, concerns, problems, updates or refill requests.   Our phone number is [phone]. We also have an after hours call service for urgent matters and there is a physician on-call for urgent questions. For any emergencies you know to call 911 or go to the nearest emergency room

## 2016-07-27 ENCOUNTER — Telehealth: Payer: Self-pay | Admitting: *Deleted

## 2016-07-27 LAB — TSH: TSH: 1.89 u[IU]/mL (ref 0.450–4.500)

## 2016-07-27 LAB — VITAMIN B12: VITAMIN B 12: 681 pg/mL (ref 211–946)

## 2016-07-27 NOTE — Telephone Encounter (Signed)
-----   Message from Melvenia Beam, MD sent at 07/27/2016 10:14 AM EDT ----- Labs  normal

## 2016-07-27 NOTE — Telephone Encounter (Signed)
Called and spoke to pt about normal labs per Dr Jaynee Eagles. Pt verbalized understanding. She is going to discuss CREAD study with husband this weekend and will call and let us know what they want to do.

## 2016-08-10 ENCOUNTER — Telehealth: Payer: Self-pay | Admitting: Emergency Medicine

## 2016-08-10 MED ORDER — DULOXETINE HCL 60 MG PO CPEP: ORAL_CAPSULE | ORAL | 1 refills | Status: DC

## 2016-08-10 NOTE — Addendum Note (Signed)
Addended by: Davis Gourd on: 08/10/2016 04:49 PM   Modules accepted: Orders

## 2016-08-10 NOTE — Telephone Encounter (Signed)
Medication filled to pharmacy as requested.   

## 2016-08-10 NOTE — Telephone Encounter (Signed)
[name] called from [facility] requesting refill for Cymbalta. Last Ov: 03/01/15 Pulmonary nodules Last rx: 06/11/15 180 1RF Please advise

## 2016-08-14 ENCOUNTER — Telehealth: Payer: Self-pay | Admitting: Neurology

## 2016-08-14 ENCOUNTER — Other Ambulatory Visit: Payer: Self-pay | Admitting: Neurology

## 2016-08-14 DIAGNOSIS — R42 Dizziness and giddiness: Secondary | ICD-10-CM

## 2016-08-14 NOTE — Telephone Encounter (Signed)
Her brain is "foggy". She feels drained by crying on Thursday and she feels wiped out. She slept Friday. And on Sunday she couldn't remember she wasn't there last week. They were eating with a few couples and she couldn't remember someone had her baby. She has a lot of stress. Her friend said she has had a lot of stress. She also had a concussion.  Terrence Dupont, can you check in [place] if she had an MRI of the brain there after her trauma? Also will order vestibular therapy thanks

## 2016-08-14 NOTE — Telephone Encounter (Signed)
[name]- please advise  Called pt back. She stated she could not get an appt for neurocognitive testing until October 4th to see [name]. Last Thursday she went to hand doctor. Went to appt and felt okay. Head a little foggy than normal. Thinks it might be meds she is taking. By the time doctor got in room, she could not remember what she was at appt for. Doctor had to explain to her why she was there. He had to calm her down. She was crying in the office. She sat out in the car and then drove home. She got home and husband got there and she was upset again about everything.   Saturday went out to eat with friends and they were talking about things that they had previously done and she did not remember any of that. She went to church yesterday. Friends were telling her they missed her. She did not remember not going to church the last 2 weeks. Husband had to remind her it was d/t her not feeling well. She is very concerned and wants to know what she should do from here. She is still having dizziness and tremors. Advised I will send to [name] and call her back to advise.

## 2016-08-14 NOTE — Telephone Encounter (Signed)
Patient is calling regarding new symptoms and would like to discuss. She is having blocks of memory gone. Please call to discuss.

## 2016-08-14 NOTE — Telephone Encounter (Signed)
Dr Jaynee Eagles- I think you can see MRI brain from [facility] on care everywhere. It was done on 07/02/16. Is this it?

## 2016-08-14 NOTE — Telephone Encounter (Incomplete)
Her brain is "foggy". She feels drained by crying on Thursday and she feels wiped out. She slept Friday. And on Sunday she couldn't remember she wasn't there last week. They were eating with a few couples and she couldn't remmebr someone had her grandbaby.

## 2016-08-16 NOTE — Telephone Encounter (Signed)
Yes, thanbks. Let patinet know her MRI of the brain and MRA of the head were normal in July 2017. Thanks.

## 2016-08-16 NOTE — Telephone Encounter (Signed)
Called and spoke to patient. Relayed Dr Cathren Laine message. She verbalized understanding.

## 2016-08-29 ENCOUNTER — Encounter (INDEPENDENT_AMBULATORY_CARE_PROVIDER_SITE_OTHER): Payer: Self-pay

## 2016-08-29 ENCOUNTER — Ambulatory Visit (INDEPENDENT_AMBULATORY_CARE_PROVIDER_SITE_OTHER): Payer: Medicare Other | Admitting: Emergency Medicine

## 2016-08-29 ENCOUNTER — Encounter: Payer: Self-pay | Admitting: Emergency Medicine

## 2016-08-29 VITALS — BP 114/82 | HR 74 | Ht 65.0 in | Wt 122.0 lb

## 2016-08-29 DIAGNOSIS — R918 Other nonspecific abnormal finding of lung field: Secondary | ICD-10-CM

## 2016-08-29 DIAGNOSIS — J309 Allergic rhinitis, unspecified: Secondary | ICD-10-CM

## 2016-08-29 DIAGNOSIS — R911 Solitary pulmonary nodule: Secondary | ICD-10-CM

## 2016-08-29 NOTE — Assessment & Plan Note (Signed)
She is now able to use her loratadine and fluticasone nasal spray on a when necessary basis

## 2016-08-29 NOTE — Assessment & Plan Note (Signed)
We will plan for 1 final repeat CT scan of the chest in January 2018, no contrast

## 2016-08-29 NOTE — Progress Notes (Signed)
Subjective:    Patient ID: [name], female    DOB: 1946-04-06, 70 y.o.   MRN: [id number]  HPI 70 yo woman, former smoker (10-15pk-yrs), hx allergic rhinitis, HTN, GERD, depression, tachyarrhythmia, non-obstructive CAD, per cath 07/2010, s/p Hysterectomy, s/p Throat cyst removal, s/p skin cancer removal in past, admitted to [facility] ED on 06/03/12 with cc of persistent cough, worsening SOB and wheeze, dx with COPD exacerbation, but also some question of whether she may have had cardiac dz - subsequent myoview and TTE have been reassuring [name]). In retrospect she now mentions that she has always been sensitive to fumes, allergies even as a girl - never dx with asthma officially.  Exacerbating factors appear to be fumes, allergies, GERD.  ROV 71/1/13 -- follow up for SOB and cough, w hx throat cyst as above. 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 Nasonex was stopped. She also has seen [facility] regarding recurrence of her breast CA. She underwent PFT today that confirm moderately severe AFL with BD response. She has only intermittent sx, has rarely used albuterol. Her allergies, fumes, are triggers.   ROV 02/12/15 -- follow-up visit for cough and dyspnea. Her spirometry in the past has shown obstruction with a positive bronchodilator response. She has been using albuterol as needed when she flares. She had been well until late 11/2014 when she developed URI sx. She was in the hospital for this - was dealing with cough, mucous, dyspnea. She has a lot of nasal and sinus congestion, has continued to cough. She underwent CT-PA on 12/30/14 that showed RUL nodular opacities. She is having exertional SOB, no wheeze. She is scheduled to undergo a cardiac stress test soon. She is using flonase,   ROV 03/2015 follow-up visit for obstructive lung disease with a positive bronchodilator response. She also has a history of pulmonary nodule that we've been following last CT scan. Her most  recent was done in March 2016 and shows that the 5 mm right upper lobe nodule is stable. She is currently on . She tells me that her breathing is much better. She has been using loratadine and flonase. She has not needed her SABA prn. She is not on a scheduled BD. Cardiac stress testing > low risk test.   ROV 01/25/16 -- Follow-up visit for asthmatic bronchitis as well as a history of a vocal cord hemangioma, allergic rhinitis, chronic cough. She also has a history of a 5 mm right upper lobe pulmonary nodule, last CT scan was March 2016.   She has had cataract surgery that did not restore her vision well.  She has been well until about 2-3 days ago, has been good about using nasal steroid and saline. She is now having sore throat, raspy tight feeling.  She has proair available, almost never uses it. No other flares reported. She is being treated monthly by Dr Wilburn Cornelia with electrocautery.   ROV 03/02/16 -- follow-up visit for history of asthma and chronic bronchitis, chronic cough, allergic rhinitis, and history of a vocal cord hemangioma. We have also followed her with serial CT scans of the chest for a 5 mm right upper lobe nodule. Her most recent scan was 02/28/16 and I personally reviewed the images. This shows some biapical scarring and apparent resolution of her peripheral right upper lobe nodule. There are no new nodules seen. She is experiencing cough and sputum in February and was treated with a course of doxycycline for bronchitis. She  feels that she is doing well, has noticed some exertional SOB up stairs. Also some increased GERD sx on omeprazole bid, to see GI soon. She is doing nasal washes, nasal steroid, loratadine. Following w Dr Wilburn Cornelia.   ROV 08/29/16 -- Patient has a history of asthma and chronic bronchitis, chronic cough, chronic rhinitis. She also has a history of a vocal cord hemangioma followed by Dr. Wilburn Cornelia with ENT. We have also been following a 5 mm right upper lobe nodule, most  recent CT scan March 2017.  She has had a couple of fall since our last visit, is under eval by Neuro for imbalance, vertigo. Her breathing has been doing very well. She did need albuterol some during the Spring allergy season. Minimal albuterol use. Has seen Dr Havery Moros regarding her GERD, is on omeprazole 40mg  bid. She uses loratadine prn, flonase prn.     PULMONARY FUNCTON TEST 09/24/2012  FVC 2.45  FEV1 1.31  FEV1/FVC 53.5  FVC  % Predicted 81  FEV % Predicted 60  FeF 25-75 .48  FeF 25-75 % Predicted 2.44       Objective:   Physical Exam Vitals:   08/29/16 0944 08/29/16 0945  BP:  114/82  Pulse:  74  SpO2:  98%  Weight: 122 lb (55.3 kg)   Height: 5\' 5"  (1.651 m)    Gen: Pleasant, well-nourished, in no distress,  normal affect  ENT: No lesions,  mouth clear,  oropharynx clear, no postnasal drip  Neck: No JVD, no TMG, no carotid bruits  Lungs: No use of accessory muscles, no dullness to percussion, clear without rales or rhonchi  Cardiovascular: RRR, heart sounds normal, no murmur or gallops, no peripheral edema  Musculoskeletal: No deformities, no cyanosis or clubbing  Neuro: alert, non focal  Skin: Warm, no lesions or rashes  02/23/15 --  Reviewed by me personally COMPARISON: 01/01/2015  FINDINGS: Mediastinum: Normal heart size. There is no pericardial effusion identified. Calcified atherosclerotic disease noted involving the LAD Coronary artery. The trachea is patent and appears midline. There is a normal appearance of the esophagus. No mediastinal or hilar lymph nodes identified.  Lungs/Pleura: No pleural effusion noted. 5 mm right upper lobe nodule is unchanged from previous exam, image 17 of series 3. Within the basilar portion of a right middle lobe there is a stable scar like opacity, image 30/series 3. No new pulmonary nodules identified.  Upper Abdomen: Right hepatic lobe hemangioma is again noted, image 62/series 2. The visualized portions of  the adrenal glands and spleen are normal.  Musculoskeletal: There are no aggressive lytic or sclerotic bone lesions identified. There is a mild scoliosis deformity and multi level degenerative disc disease.  IMPRESSION: 1. No acute cardiopulmonary abnormalities. 2. Stable 5 mm right upper lobe nodule. If this patient is at increased risk for lung cancer then the next followup exam should be obtained If the patient is at high risk for bronchogenic carcinoma, follow-up chest CT at 6-12 months is recommended. If the patient is at low risk for bronchogenic carcinoma, follow-up chest CT at 12 months is recommended. This recommendation follows the consensus statement: Guidelines for Management of Small Pulmonary Nodules Detected on CT Scans: A Statement from the [place] as published in Radiology 2005;237:395-400       Assessment & Plan:  Asthma, allergic Stable at this time. Minimal albuterol use. She is benefited significantly from more aggressive treatment of her GERD  GERD (gastroesophageal reflux disease) Currently on omeprazole 40 mg twice a day  Allergic rhinitis She is now able to use her loratadine and fluticasone nasal spray on a when necessary basis  Pulmonary nodules We will plan for 1 final repeat CT scan of the chest in January 2018, no contrast  Baltazar Apo, MD, PhD 08/29/2016, 10:07 AM [facility] [phone] or if no answer [phone]

## 2016-08-29 NOTE — Assessment & Plan Note (Signed)
Stable at this time. Minimal albuterol use. She is benefited significantly from more aggressive treatment of her GERD

## 2016-08-29 NOTE — Patient Instructions (Addendum)
Take albuterol 2 puffs up to every 4 hours if needed for shortness of breath.  Continue omeprazole 40mg  twice a day Use loratadine 10mg  as needed Use fluticasone nasal spray daily as needed for congestion.  We will repeat your CT chest in January 2018 to follow pulmonary nodule.  Follow with Dr Lamonte Sakai in January after the scan to review.

## 2016-08-29 NOTE — Assessment & Plan Note (Signed)
Currently on omeprazole 40 mg twice a day

## 2016-08-30 ENCOUNTER — Ambulatory Visit: Payer: Medicare Other | Attending: Neurology | Admitting: Physical Therapy

## 2016-08-30 DIAGNOSIS — R42 Dizziness and giddiness: Secondary | ICD-10-CM | POA: Diagnosis not present

## 2016-08-30 NOTE — Patient Instructions (Signed)
Gaze Stabilization: Sitting    Keeping eyes on target on wall 3-5 feet away, move head side to side for __30__ seconds. Repeat while moving head up and down for _30___ seconds. Do _2-3___ sessions per day.   Copyright  VHI. All rights reserved.    Gaze Stabilization: Tip Card  1.Target must remain in focus, not blurry, and appear stationary while head is in motion. 2.Perform exercises with small head movements (45 to either side of midline). 3.Increase speed of head motion so long as target is in focus. 4.If you wear eyeglasses, be sure you can see target through lens (therapist will give specific instructions for bifocal / progressive lenses). 5.These exercises may provoke dizziness or nausea. Work through these symptoms. If too dizzy, slow head movement slightly. Rest between each exercise. 6.Exercises demand concentration; avoid distractions.  Copyright  VHI. All rights reserved.

## 2016-08-30 NOTE — Therapy (Signed)
[facility] [address]  [place], [place], [address] Phone: [phone]   Fax:  [phone]  Physical Therapy Evaluation  Patient Details  Name: [name] MRN: [id number] Date of Birth: 17-Oct-1946 Referring Provider: Dr. Sarina Ill  Encounter Date: 08/30/2016      PT End of Session - 08/30/16 1322    Visit Number 1   Number of Visits 6   Date for PT Re-Evaluation 10/11/16   PT Start Time [id number]   PT Stop Time 1057   PT Time Calculation (min) 42 min   Activity Tolerance Patient tolerated treatment well   Behavior During Therapy [facility] for tasks assessed/performed      Past Medical History:  Diagnosis Date  . Arthritis   . Asthma   . CAD (coronary artery disease) CARDIOLOGIST--  DR Angelena Form   mild non-obstructive cad  . Chronic constipation   . COPD (chronic obstructive pulmonary disease) ([place])   . Fibromyalgia   . Frequency of urination   . GERD (gastroesophageal reflux disease)   . H/O hiatal hernia   . History of basal cell carcinoma excision    X2  . History of breast cancer ONCOLOGIST-- DR Jana Hakim---  NO RECURRANCE   DX 07/2012;  LOW GRADE DCIS  ER+PR+  ----  S/P RIGHT LUMPECTOMY WITH NEGATIVE MARGINS/   RADIATION ENDED 11/2012  . History of chronic bronchitis   . History of colonic polyps   . History of tachycardia    CONTROLLED  WITH ATENOLOL  . Hypertension   . Pelvic pain   . S/P radiation therapy 11/12/12 - 12/05/12   right Breast  . Sinus headache   . Urgency of urination     Past Surgical History:  Procedure Laterality Date  . [place] STUDY N/A 04/03/2016   Procedure: [place] STUDY;  Surgeon: Manus Gunning, MD;  Location: [facility];  Service: Gastroenterology;  Laterality: N/A;  . BREAST LUMPECTOMY Right 10-11-2012   W/ SLN BX  . CARDIAC CATHETERIZATION  09-13-2007  DR Lia Foyer   WELL-PRESERVED LVF/  DIFFUSE SCATTERED CORONARY CALCIFACATION AND ATHEROSCLEROSIS  WITHOUT OBSTRUCTION  . CARDIAC CATHETERIZATION  08-04-2010  DR Angelena Form   NON-OBSTRUCTIVE CAD/  pLAD 40%/  oLAD 30%/  mLAD 30%/  pRCA 30%/  EF 60%  . CARDIOVASCULAR STRESS TEST  06-18-2012  DR McALHANY   LOW RISK NUCLEAR STUDY/  SMALL FIXED AREA OF MODERATELY DECREASED UPTAKE IN ANTEROSEPTAL WALL WHICH MAY BE ARTIFACTUAL/  NO ISCHEMIA/  EF 68%  . COLONOSCOPY  09-29-2010  . CYSTOSCOPY WITH HYDRODISTENSION AND BIOPSY N/A 03/06/2014   Procedure: CYSTOSCOPY/HYDRODISTENSION/ INSTILATION OF MARCAINE AND PYRIDIUM;  Surgeon: Ailene Rud, MD;  Location[facility];  Service: Urology;  Laterality: N/A;  . ESOPHAGEAL MANOMETRY N/A 04/03/2016   Procedure: ESOPHAGEAL MANOMETRY (EM);  Surgeon: Manus Gunning, MD;  Location: [facility];  Service: Gastroenterology;  Laterality: N/A;  . NASAL SINUS SURGERY  1985  . ORIF RIGHT ANKLE  FX  2006  . REMOVAL VOCAL CORD CYST  FEB 2014  . RIGHT BREAST BX  08-23-2012  . RIGHT HAND SURGERY  X3  LAST ONE 2009   INCLUDES  ORIF RIGHT 5TH FINGER AND REVISION TWICE  . TONSILLECTOMY AND ADENOIDECTOMY  AGE 12  . TOTAL ABDOMINAL HYSTERECTOMY W/ BILATERAL SALPINGOOPHORECTOMY  1982   W/  APPENDECTOMY  . TRANSTHORACIC ECHOCARDIOGRAM  06-24-2012   GRADE I DIASTOLIC DYSFUNCTION/  EF 55-60%/  MILD MR  There were no vitals filed for this visit.       Subjective Assessment - 08/30/16 1021    Subjective Pt is a 70 y/o female who presents to OPPT with c/o dizziness.  Pt reports fall 2 months ago (had pelvic stress fx).  Pt reports fall with concussion and symptoms described as spinning.  Spinning resolves unless pt bends down or extends neck.   Pertinent History mild memory loss, concussion, see snapshot   Currently in Pain? Yes   Pain Score 6    Pain Location Neck   Pain Orientation Right;Lateral   Pain Descriptors / Indicators Headache   Pain Radiating Towards starts in neck up to head   Pain Onset More than a month ago   Pain Frequency  Constant   Aggravating Factors  constant            [facility] PT Assessment - 08/30/16 1024      Assessment   Medical Diagnosis vertigo   Referring Provider Dr. Sarina Ill   Onset Date/Surgical Date --  2 months ago   Next MD Visit Oct 31, 2016   Prior Therapy none for vertigo     Precautions   Precautions Fall     Restrictions   Weight Bearing Restrictions No     Balance Screen   Has the patient fallen in the past 6 months Yes   How many times? couple times a week   Has the patient had a decrease in activity level because of a fear of falling?  Yes   Is the patient reluctant to leave their home because of a fear of falling?  Yes     [place] Private residence   Living Arrangements Spouse/significant other   Type of [place] to enter   Entrance Stairs-Number of Steps 2   Entrance Stairs-Rails Can reach both   [place] Two level;Bed/bath upstairs   Alternate Level Stairs-Number of Steps 18   Alternate Level Stairs-Rails Right   [place] - single point     Prior Function   Level of Independence Independent;Requires assistive device for independence   Vocation Retired   Leisure active in church, dine out     Cognition   Overall Cognitive Status --  pt reports some expressive difficulties     Observation/Other Assessments   Focus on Therapeutic Outcomes (FOTO)  64 (36% limited; predicted 17% limited)     Functional Tests   Functional tests Other     Other:   Other/ Comments LOB on compliant surface with eyes closed            Vestibular Assessment - 08/30/16 1028      Symptom Behavior   Type of Dizziness Spinning   Frequency of Dizziness in AM; inconsistent   Duration of Dizziness 1 min ("or longer")   Aggravating Factors Spontaneous onset;Mornings;Sitting with head tilted back;Forward bending   Relieving Factors Head stationary;Closing eyes;Rest     Occulomotor Exam   Occulomotor  Alignment Abnormal  R eye elevated   Spontaneous Absent   Gaze-induced Absent   Smooth Pursuits Intact   Saccades Intact     Vestibulo-Occular Reflex   VOR 1 Head Only (x 1 viewing) Increase in symptoms     Visual Acuity   Static Line 4   Dynamic Line 1     Positional Testing   Dix-Hallpike Dix-Hallpike Right;Dix-Hallpike Left   Sidelying Test Sidelying Right;Sidelying Left  Horizontal Canal Testing Horizontal Canal Right;Horizontal Canal Left     Dix-Hallpike Right   Dix-Hallpike Right Duration none; symptoms with returning to sitting   Dix-Hallpike Right Symptoms No nystagmus     Dix-Hallpike Left   Dix-Hallpike Left Duration 1 beat - unable to determine the direction     Sidelying Right   Sidelying Right Duration none   Sidelying Right Symptoms No nystagmus     Sidelying Left   Sidelying Left Duration none   Sidelying Left Symptoms No nystagmus     Horizontal Canal Right   Horizontal Canal Right Duration none   Horizontal Canal Right Symptoms Normal     Horizontal Canal Left   Horizontal Canal Left Duration none   Horizontal Canal Left Symptoms Normal                Vestibular Treatment/Exercise - 08/30/16 0001      Vestibular Treatment/Exercise   Gaze Exercises X1 Viewing Horizontal;X1 Viewing Vertical     X1 Viewing Horizontal   Foot Position sitting   Time --  30 sec   Reps 1     X1 Viewing Vertical   Foot Position sitting   Time --  30 sec   Reps 1   Comments pt reports vertical more symptom provoking than horizontal               PT Education - 08/30/16 1321    Education provided Yes   Education Details HEP   Person(s) Educated Patient   Methods Explanation;Handout;Demonstration   Comprehension Verbalized understanding;Need further instruction;Returned demonstration             PT Long Term Goals - 08/30/16 1339      PT LONG TERM GOAL #1   Title independent with HEP (10/11/16)   Time 6   Period Weeks   Status  New     PT LONG TERM GOAL #2   Title demonstrate ability to stand 20 sec on compliant surface with EC without LOB for improved vestibular input (10/11/16)   Time 6   Period Weeks   Status New     PT LONG TERM GOAL #3   Title report 75% improvement in symtoms (10/11/16)   Time 6   Period Weeks   Status New     PT LONG TERM GOAL #4   Title n/a     PT LONG TERM GOAL #5   Title n/a               Plan - 08/30/16 1322    Clinical Impression Statement Pt is a 70 y/o female who presents to OPPT for moderate complexity PT eval for vertigo.  Pt's subjective highly suspicious for BPPV however unable to provoke symptoms in clinic today, with the exception of possibly 1 beat of nystagmus in L dix hallpike.  Other clinical findings indicate decreased vestibular input and visulo-vestibular input.  Will plan to address these deficits and assess vertigo as needed.   Rehab Potential Good   PT Frequency 1x / week  will see 2x/wk if needed   PT Duration 6 weeks   PT Treatment/Interventions ADLs/Self Care Home Management;Canalith Repostioning;Neuromuscular re-education;Balance training;Therapeutic exercise;Therapeutic activities;Functional mobility training;Stair training;Gait training;Patient/family education;Vestibular   PT Next Visit Plan reassess BPPV if indicated; review gaze and progress balance/vestibular exercises   Consulted and Agree with Plan of Care Patient      Patient will benefit from skilled therapeutic intervention in order to improve the following deficits and impairments:  Pain, Decreased balance, Decreased mobility, Difficulty walking, Dizziness  Visit Diagnosis: Dizziness and giddiness - Plan: PT plan of care cert/re-cert      G-Codes - [id number] 1342    Functional Assessment Tool Used FOTO 36% limited   Functional Limitation Changing and maintaining body position   Changing and Maintaining Body Position Current Status [id number]) At least 20 percent but less than 40  percent impaired, limited or restricted   Changing and Maintaining Body Position Goal Status [id number]) At least 1 percent but less than 20 percent impaired, limited or restricted       Problem List Patient Active Problem List   Diagnosis Date Noted  . Dysphagia   . Esophageal reflux   . Allergic rhinitis 01/25/2016  . Shortness of breath   . Left hip pain 04/30/2015  . Sciatica 04/30/2015  . Knee pain, right 04/30/2015  . Leukocytes in urine 04/30/2015  . Hyperlipidemia 03/01/2015  . Bilateral hand numbness 03/01/2015  . Pulmonary nodules 02/12/2015  . External hemorrhoids 02/05/2015  . Vaginitis and vulvovaginitis 02/05/2015  . Chronic night sweats 01/08/2015  . Atypical chest pain 01/01/2015  . Chest pain 01/01/2015  . Renal insufficiency 07/16/2014  . Sepsis secondary to UTI ([place]) 02/05/2014  . Grief reaction 12/29/2013  . Urinary hesitancy 12/04/2013  . Abdominal pain, other specified site 10/26/2013  . Headache(784.0) 10/13/2013  . Pain, joint, multiple sites 08/18/2013  . Vasculitis ([place]) 07/29/2013  . ANA positive 07/29/2013  . Other and unspecified hyperlipidemia 07/12/2013  . Contusion, chest wall 04/30/2013  . Malignant neoplasm of lower-outer quadrant of female breast ([place]) 01/03/2013  . Breast cancer, right breast ([place]) 10/17/2012  . Splenic lesion 09/02/2012  . Asthma, allergic 08/05/2012  . GERD (gastroesophageal reflux disease) 06/03/2012  . CAD (coronary artery disease) 06/03/2012  . Former heavy tobacco smoker 06/03/2012  . Edema 04/08/2012  . Stricture and stenosis of esophagus 10/19/2011  . Constipation - functional 07/21/2011  . Nonspecific (abnormal) findings on radiological and other examination of biliary tract 07/21/2011  . Acute UTI 06/16/2011  . Osteoporosis 04/17/2011  . POSTMENOPAUSAL STATUS 01/27/2011  . CONSTIPATION 11/02/2010  . FIBROCYSTIC BREAST DISEASE, HX OF 10/18/2010  . CAD, NATIVE VESSEL 08/31/2010  . Essential hypertension  08/25/2010  . CAROTID ARTERY DISEASE 08/25/2010  . TOBACCO ABUSE, HX OF 08/25/2010  . GENERALIZED ANXIETY DISORDER 08/03/2010  . FIBROMYALGIA 01/11/2010  . SKIN CANCER, HX OF 01/11/2010       Laureen Abrahams, PT, DPT 08/30/16 1:44 PM    [facility] [address]  [place], [place], [address] Phone: [phone]   Fax:  [phone]  Name: [name] MRN: [id number] Date of Birth: 07/06/1946

## 2016-09-06 ENCOUNTER — Ambulatory Visit: Payer: Medicare Other | Admitting: Physical Therapy

## 2016-09-06 DIAGNOSIS — R42 Dizziness and giddiness: Secondary | ICD-10-CM | POA: Diagnosis not present

## 2016-09-06 NOTE — Therapy (Signed)
[facility] [address]  [place], [place], [address] Phone: [phone]   Fax:  [phone]  Physical Therapy Treatment  Patient Details  Name: [name] MRN: [id number] Date of Birth: [birth year] Referring Provider: Dr. Sarina Ill  Encounter Date: 09/06/2016      PT End of Session - 09/06/16 0927    Visit Number 2   Number of Visits 6   Date for PT Re-Evaluation 10/11/16   PT Start Time 0848   PT Stop Time 0924   PT Time Calculation (min) 36 min   Activity Tolerance Patient tolerated treatment well   Behavior During Therapy [facility] for tasks assessed/performed      Past Medical History:  Diagnosis Date  . Arthritis   . Asthma   . CAD (coronary artery disease) CARDIOLOGIST--  DR Angelena Form   mild non-obstructive cad  . Chronic constipation   . COPD (chronic obstructive pulmonary disease) ([place])   . Fibromyalgia   . Frequency of urination   . GERD (gastroesophageal reflux disease)   . H/O hiatal hernia   . History of basal cell carcinoma excision    X2  . History of breast cancer ONCOLOGIST-- DR Jana Hakim---  NO RECURRANCE   DX 07/2012;  LOW GRADE DCIS  ER+PR+  ----  S/P RIGHT LUMPECTOMY WITH NEGATIVE MARGINS/   RADIATION ENDED 11/2012  . History of chronic bronchitis   . History of colonic polyps   . History of tachycardia    CONTROLLED  WITH ATENOLOL  . Hypertension   . Pelvic pain   . S/P radiation therapy 11/12/12 - 12/05/12   right Breast  . Sinus headache   . Urgency of urination     Past Surgical History:  Procedure Laterality Date  . [place] STUDY N/A 04/03/2016   Procedure: [place] STUDY;  Surgeon: Manus Gunning, MD;  Location: [facility];  Service: Gastroenterology;  Laterality: N/A;  . BREAST LUMPECTOMY Right 10-11-2012   W/ SLN BX  . CARDIAC CATHETERIZATION  09-13-2007  DR Lia Foyer   WELL-PRESERVED LVF/  DIFFUSE SCATTERED CORONARY CALCIFACATION AND ATHEROSCLEROSIS  WITHOUT OBSTRUCTION  . CARDIAC CATHETERIZATION  08-04-2010  DR Angelena Form   NON-OBSTRUCTIVE CAD/  pLAD 40%/  oLAD 30%/  mLAD 30%/  pRCA 30%/  EF 60%  . CARDIOVASCULAR STRESS TEST  06-18-2012  DR McALHANY   LOW RISK NUCLEAR STUDY/  SMALL FIXED AREA OF MODERATELY DECREASED UPTAKE IN ANTEROSEPTAL WALL WHICH MAY BE ARTIFACTUAL/  NO ISCHEMIA/  EF 68%  . COLONOSCOPY  09-29-2010  . CYSTOSCOPY WITH HYDRODISTENSION AND BIOPSY N/A 03/06/2014   Procedure: CYSTOSCOPY/HYDRODISTENSION/ INSTILATION OF MARCAINE AND PYRIDIUM;  Surgeon: Ailene Rud, MD;  Location[facility];  Service: Urology;  Laterality: N/A;  . ESOPHAGEAL MANOMETRY N/A 04/03/2016   Procedure: ESOPHAGEAL MANOMETRY (EM);  Surgeon: Manus Gunning, MD;  Location: [facility];  Service: Gastroenterology;  Laterality: N/A;  . NASAL SINUS SURGERY  1985  . ORIF RIGHT ANKLE  FX  2006  . REMOVAL VOCAL CORD CYST  FEB 2014  . RIGHT BREAST BX  08-23-2012  . RIGHT HAND SURGERY  X3  LAST ONE 2009   INCLUDES  ORIF RIGHT 5TH FINGER AND REVISION TWICE  . TONSILLECTOMY AND ADENOIDECTOMY  AGE 70  . TOTAL ABDOMINAL HYSTERECTOMY W/ BILATERAL SALPINGOOPHORECTOMY  1982   W/  APPENDECTOMY  . TRANSTHORACIC ECHOCARDIOGRAM  06-24-2012   GRADE I DIASTOLIC DYSFUNCTION/  EF 55-60%/  MILD MR  There were no vitals filed for this visit.      Subjective Assessment - 09/06/16 0851    Subjective had 2 falls, one going up stairs carrying items in one hand, other at church going up stairs.  neck feels much better, overall no pain.  started doing exercises again and pain resolved. still having some spinning when bringing head back up after looking down.   Pertinent History mild memory loss, concussion, see snapshot   Currently in Pain? No/denies                Vestibular Assessment - 09/06/16 0852      Vestibular Assessment   General Observation no symptoms today     Symptom Behavior   Type of Dizziness Spinning      Positional Testing   Dix-Hallpike Dix-Hallpike Left     Dix-Hallpike Left   Dix-Hallpike Left Duration 7-8 sec   Dix-Hallpike Left Symptoms Other (comment)  pt closed eyes so unable to see nystagmus                 [facility] Adult PT Treatment/Exercise - 09/06/16 0001      Self-Care   Self-Care Other Self-Care Comments   Other Self-Care Comments  educated on BPPV - causes, interventions, possible return - pt verbalized understanding         Vestibular Treatment/Exercise - 09/06/16 0912      Vestibular Treatment/Exercise   Vestibular Treatment Provided Canalith Repositioning   Canalith Repositioning Epley Manuever Left      EPLEY MANUEVER LEFT   Number of Reps  2   Overall Response  Symptoms Resolved     X1 Viewing Horizontal   Foot Position standing level ground (attempted compliant surface but pt unable to maintain balance)   Time --  30 sec   Reps 1   Comments mod cues and attempts needed for correct technique     X1 Viewing Vertical   Foot Position standing   Time --  30 sec   Reps 1   Comments less symptoms today than with horizontal               PT Education - 09/06/16 0927    Education provided Yes   Education Details BPPV   Person(s) Educated Patient   Methods Explanation;Demonstration;Handout   Comprehension Verbalized understanding;Returned demonstration;Need further instruction             PT Long Term Goals - 08/30/16 1339      PT LONG TERM GOAL #1   Title independent with HEP (10/11/16)   Time 6   Period Weeks   Status New     PT LONG TERM GOAL #2   Title demonstrate ability to stand 20 sec on compliant surface with EC without LOB for improved vestibular input (10/11/16)   Time 6   Period Weeks   Status New     PT LONG TERM GOAL #3   Title report 75% improvement in symtoms (10/11/16)   Time 6   Period Weeks   Status New     PT LONG TERM GOAL #4   Title n/a     PT LONG TERM GOAL #5   Title n/a                Plan - 09/06/16 0927    Clinical Impression Statement Pt still with symptoms without nystagmus in L dix hallpike so epley's performed x 2 reps today with resolve of symptoms on 2nd rep.  Progressed HEP to standing gaze but unable to perform on compliant surface to to repeated LOB.  Will continue to benefit from PT to maximize function.   PT Treatment/Interventions ADLs/Self Care Home Management;Canalith Repostioning;Neuromuscular re-education;Balance training;Therapeutic exercise;Therapeutic activities;Functional mobility training;Stair training;Gait training;Patient/family education;Vestibular   PT Next Visit Plan reassess BPPV if indicated; review gaze and progress balance/vestibular exercises   Consulted and Agree with Plan of Care Patient      Patient will benefit from skilled therapeutic intervention in order to improve the following deficits and impairments:  Pain, Decreased balance, Decreased mobility, Difficulty walking, Dizziness  Visit Diagnosis: Dizziness and giddiness     Problem List Patient Active Problem List   Diagnosis Date Noted  . Dysphagia   . Esophageal reflux   . Allergic rhinitis 01/25/2016  . Shortness of breath   . Left hip pain 04/30/2015  . Sciatica 04/30/2015  . Knee pain, right 04/30/2015  . Leukocytes in urine 04/30/2015  . Hyperlipidemia 03/01/2015  . Bilateral hand numbness 03/01/2015  . Pulmonary nodules 02/12/2015  . External hemorrhoids 02/05/2015  . Vaginitis and vulvovaginitis 02/05/2015  . Chronic night sweats 01/08/2015  . Atypical chest pain 01/01/2015  . Chest pain 01/01/2015  . Renal insufficiency 07/16/2014  . Sepsis secondary to UTI ([place]) 02/05/2014  . Grief reaction 12/29/2013  . Urinary hesitancy 12/04/2013  . Abdominal pain, other specified site 10/26/2013  . Headache(784.0) 10/13/2013  . Pain, joint, multiple sites 08/18/2013  . Vasculitis ([place]) 07/29/2013  . ANA positive 07/29/2013  . Other and  unspecified hyperlipidemia 07/12/2013  . Contusion, chest wall 04/30/2013  . Malignant neoplasm of lower-outer quadrant of female breast ([place]) 01/03/2013  . Breast cancer, right breast ([place]) 10/17/2012  . Splenic lesion 09/02/2012  . Asthma, allergic 08/05/2012  . GERD (gastroesophageal reflux disease) 06/03/2012  . CAD (coronary artery disease) 06/03/2012  . Former heavy tobacco smoker 06/03/2012  . Edema 04/08/2012  . Stricture and stenosis of esophagus 10/19/2011  . Constipation - functional 07/21/2011  . Nonspecific (abnormal) findings on radiological and other examination of biliary tract 07/21/2011  . Acute UTI 06/16/2011  . Osteoporosis 04/17/2011  . POSTMENOPAUSAL STATUS 01/27/2011  . CONSTIPATION 11/02/2010  . FIBROCYSTIC BREAST DISEASE, HX OF 10/18/2010  . CAD, NATIVE VESSEL 08/31/2010  . Essential hypertension 08/25/2010  . CAROTID ARTERY DISEASE 08/25/2010  . TOBACCO ABUSE, HX OF 08/25/2010  . GENERALIZED ANXIETY DISORDER 08/03/2010  . FIBROMYALGIA 01/11/2010  . SKIN CANCER, HX OF 01/11/2010      Laureen Abrahams, PT, DPT 09/06/16 9:29 AM    [facility] [address]  [place], [place], [address] Phone: [phone]   Fax:  [phone]  Name: Stasia Marschke MRN: [id number] Date of Birth: September 06, 1946

## 2016-09-11 ENCOUNTER — Ambulatory Visit: Payer: Medicare Other | Admitting: Physical Therapy

## 2016-09-11 DIAGNOSIS — R42 Dizziness and giddiness: Secondary | ICD-10-CM | POA: Diagnosis not present

## 2016-09-11 NOTE — Therapy (Signed)
[facility] [address]  [place], [place], [address] Phone: [phone]   Fax:  [phone]  Physical Therapy Treatment  Patient Details  Name: Barbarajo Jagger MRN: [id number] Date of Birth: 1946-04-21 Referring Provider: Dr. Sarina Ill  Encounter Date: 09/11/2016      PT End of Session - 09/11/16 1253    Visit Number 3   Number of Visits 6   Date for PT Re-Evaluation 10/11/16   PT Start Time 1020   PT Stop Time 1058   PT Time Calculation (min) 38 min   Activity Tolerance Patient tolerated treatment well   Behavior During Therapy [facility] for tasks assessed/performed      Past Medical History:  Diagnosis Date  . Arthritis   . Asthma   . CAD (coronary artery disease) CARDIOLOGIST--  DR Angelena Form   mild non-obstructive cad  . Chronic constipation   . COPD (chronic obstructive pulmonary disease) ([place])   . Fibromyalgia   . Frequency of urination   . GERD (gastroesophageal reflux disease)   . H/O hiatal hernia   . History of basal cell carcinoma excision    X2  . History of breast cancer ONCOLOGIST-- DR Jana Hakim---  NO RECURRANCE   DX 07/2012;  LOW GRADE DCIS  ER+PR+  ----  S/P RIGHT LUMPECTOMY WITH NEGATIVE MARGINS/   RADIATION ENDED 11/2012  . History of chronic bronchitis   . History of colonic polyps   . History of tachycardia    CONTROLLED  WITH ATENOLOL  . Hypertension   . Pelvic pain   . S/P radiation therapy 11/12/12 - 12/05/12   right Breast  . Sinus headache   . Urgency of urination     Past Surgical History:  Procedure Laterality Date  . [place] STUDY N/A 04/03/2016   Procedure: [place] STUDY;  Surgeon: Manus Gunning, MD;  Location: [facility];  Service: Gastroenterology;  Laterality: N/A;  . BREAST LUMPECTOMY Right 10-11-2012   W/ SLN BX  . CARDIAC CATHETERIZATION  09-13-2007  DR Lia Foyer   WELL-PRESERVED LVF/  DIFFUSE SCATTERED CORONARY CALCIFACATION AND ATHEROSCLEROSIS  WITHOUT OBSTRUCTION  . CARDIAC CATHETERIZATION  08-04-2010  DR Angelena Form   NON-OBSTRUCTIVE CAD/  pLAD 40%/  oLAD 30%/  mLAD 30%/  pRCA 30%/  EF 60%  . CARDIOVASCULAR STRESS TEST  06-18-2012  DR McALHANY   LOW RISK NUCLEAR STUDY/  SMALL FIXED AREA OF MODERATELY DECREASED UPTAKE IN ANTEROSEPTAL WALL WHICH MAY BE ARTIFACTUAL/  NO ISCHEMIA/  EF 68%  . COLONOSCOPY  09-29-2010  . CYSTOSCOPY WITH HYDRODISTENSION AND BIOPSY N/A 03/06/2014   Procedure: CYSTOSCOPY/HYDRODISTENSION/ INSTILATION OF MARCAINE AND PYRIDIUM;  Surgeon: Ailene Rud, MD;  Location: [facility];  Service: Urology;  Laterality: N/A;  . ESOPHAGEAL MANOMETRY N/A 04/03/2016   Procedure: ESOPHAGEAL MANOMETRY (EM);  Surgeon: Manus Gunning, MD;  Location: [facility];  Service: Gastroenterology;  Laterality: N/A;  . NASAL SINUS SURGERY  1985  . ORIF RIGHT ANKLE  FX  2006  . REMOVAL VOCAL CORD CYST  FEB 2014  . RIGHT BREAST BX  08-23-2012  . RIGHT HAND SURGERY  X3  LAST ONE 2009   INCLUDES  ORIF RIGHT 5TH FINGER AND REVISION TWICE  . TONSILLECTOMY AND ADENOIDECTOMY  AGE 70  . TOTAL ABDOMINAL HYSTERECTOMY W/ BILATERAL SALPINGOOPHORECTOMY  1982   W/  APPENDECTOMY  . TRANSTHORACIC ECHOCARDIOGRAM  06-24-2012   GRADE I DIASTOLIC DYSFUNCTION/  EF 55-60%/  MILD MR  There were no vitals filed for this visit.      Subjective Assessment - 09/11/16 1022    Subjective was in bed all day yesterday due to GERD; then had some spinning when rolling over.  feels okay this morning.   Pertinent History mild memory loss, concussion, see snapshot   Currently in Pain? No/denies                Vestibular Assessment - 09/11/16 1029      Positional Testing   Dix-Hallpike Dix-Hallpike Right;Dix-Hallpike Left   Horizontal Canal Testing Horizontal Canal Right;Horizontal Canal Left     Dix-Hallpike Right   Dix-Hallpike Right Duration none   Dix-Hallpike Right Symptoms No nystagmus     Dix-Hallpike Left    Dix-Hallpike Left Duration none   Dix-Hallpike Left Symptoms No nystagmus     Horizontal Canal Right   Horizontal Canal Right Duration none   Horizontal Canal Right Symptoms Normal     Horizontal Canal Left   Horizontal Canal Left Duration none   Horizontal Canal Left Symptoms Normal     Orthostatics   BP supine (x 5 minutes) 138/84   HR supine (x 5 minutes) 76   BP standing (after 1 minute) 122/70   HR standing (after 1 minute) 79   BP standing (after 3 minutes) 130/80   HR standing (after 3 minutes) 82   Orthostatics Comment pt with positive symtoms with supine to stand                 [name] Adult PT Treatment/Exercise - 09/11/16 1059      Self-Care   Other Self-Care Comments  orthostatic hypotension and recommendations - follow up with MD         Vestibular Treatment/Exercise - 09/11/16 1059      X1 Viewing Horizontal   Comments verbally reviewed as pt was trying to perform on single limb               PT Education - 09/11/16 1253    Education provided Yes   Education Details orthostatic hypotension   Person(s) Educated Patient   Methods Explanation;Handout   Comprehension Verbalized understanding             PT Long Term Goals - 08/30/16 1339      PT LONG TERM GOAL #1   Title independent with HEP (10/11/16)   Time 6   Period Weeks   Status New     PT LONG TERM GOAL #2   Title demonstrate ability to stand 20 sec on compliant surface with EC without LOB for improved vestibular input (10/11/16)   Time 6   Period Weeks   Status New     PT LONG TERM GOAL #3   Title report 75% improvement in symtoms (10/11/16)   Time 6   Period Weeks   Status New     PT LONG TERM GOAL #4   Title n/a     PT LONG TERM GOAL #5   Title n/a               Plan - 09/11/16 1253    Clinical Impression Statement Positional testing negative today indicating resolve of BPPV, but continues to have some symptoms of dizziness.  Orthostatic  hypotension positive today in clinic and therefore recommended pt follow up with MD for additional guidance and management.  Will continue to benefit from PT to maximize function.   PT Treatment/Interventions ADLs/Self Care Home Management;Canalith Repostioning;Neuromuscular re-education;Balance training;Therapeutic  exercise;Therapeutic activities;Functional mobility training;Stair training;Gait training;Patient/family education;Vestibular   PT Next Visit Plan review gaze and progress balance/vestibular exercises   Consulted and Agree with Plan of Care Patient      Patient will benefit from skilled therapeutic intervention in order to improve the following deficits and impairments:  Pain, Decreased balance, Decreased mobility, Difficulty walking, Dizziness  Visit Diagnosis: Dizziness and giddiness     Problem List Patient Active Problem List   Diagnosis Date Noted  . Dysphagia   . Esophageal reflux   . Allergic rhinitis 01/25/2016  . Shortness of breath   . Left hip pain 04/30/2015  . Sciatica 04/30/2015  . Knee pain, right 04/30/2015  . Leukocytes in urine 04/30/2015  . Hyperlipidemia 03/01/2015  . Bilateral hand numbness 03/01/2015  . Pulmonary nodules 02/12/2015  . External hemorrhoids 02/05/2015  . Vaginitis and vulvovaginitis 02/05/2015  . Chronic night sweats 01/08/2015  . Atypical chest pain 01/01/2015  . Chest pain 01/01/2015  . Renal insufficiency 07/16/2014  . Sepsis secondary to UTI ([place]) 02/05/2014  . Grief reaction 12/29/2013  . Urinary hesitancy 12/04/2013  . Abdominal pain, other specified site 10/26/2013  . Headache(784.0) 10/13/2013  . Pain, joint, multiple sites 08/18/2013  . Vasculitis ([place]) 07/29/2013  . ANA positive 07/29/2013  . Other and unspecified hyperlipidemia 07/12/2013  . Contusion, chest wall 04/30/2013  . Malignant neoplasm of lower-outer quadrant of female breast ([place]) 01/03/2013  . Breast cancer, right breast ([place]) 10/17/2012  . Splenic  lesion 09/02/2012  . Asthma, allergic 08/05/2012  . GERD (gastroesophageal reflux disease) 06/03/2012  . CAD (coronary artery disease) 06/03/2012  . Former heavy tobacco smoker 06/03/2012  . Edema 04/08/2012  . Stricture and stenosis of esophagus 10/19/2011  . Constipation - functional 07/21/2011  . Nonspecific (abnormal) findings on radiological and other examination of biliary tract 07/21/2011  . Acute UTI 06/16/2011  . Osteoporosis 04/17/2011  . POSTMENOPAUSAL STATUS 01/27/2011  . CONSTIPATION 11/02/2010  . FIBROCYSTIC BREAST DISEASE, HX OF 10/18/2010  . CAD, NATIVE VESSEL 08/31/2010  . Essential hypertension 08/25/2010  . CAROTID ARTERY DISEASE 08/25/2010  . TOBACCO ABUSE, HX OF 08/25/2010  . GENERALIZED ANXIETY DISORDER 08/03/2010  . FIBROMYALGIA 01/11/2010  . SKIN CANCER, HX OF 01/11/2010       Laureen Abrahams, PT, DPT 09/11/16 12:55 PM    [facility] [address]  [address] [place], [place], [address] Phone: [phone]   Fax:  [phone]  Name: Jamesetta Pietri MRN: [id number] Date of Birth: Oct 23, 1946

## 2016-09-20 ENCOUNTER — Ambulatory Visit: Payer: Medicare Other | Admitting: Physical Therapy

## 2016-09-20 DIAGNOSIS — R42 Dizziness and giddiness: Secondary | ICD-10-CM

## 2016-09-20 NOTE — Therapy (Addendum)
[facility] [address]  [place], [place], [address] Phone: [phone]   Fax:  [phone]  Physical Therapy Treatment  Patient Details  Name: [name] MRN: [id number] Date of Birth: 04-Feb-1946 Referring Provider: Dr. Sarina Ill  Encounter Date: 09/20/2016      PT End of Session - 09/20/16 1001    Visit Number 4   Date for PT Re-Evaluation 10/11/16   PT Start Time 0940  pt arrived late   PT Stop Time 1000   PT Time Calculation (min) 20 min   Activity Tolerance Patient tolerated treatment well   Behavior During Therapy [facility] for tasks assessed/performed      Past Medical History:  Diagnosis Date  . Arthritis   . Asthma   . CAD (coronary artery disease) CARDIOLOGIST--  DR Angelena Form   mild non-obstructive cad  . Chronic constipation   . COPD (chronic obstructive pulmonary disease) ([place])   . Fibromyalgia   . Frequency of urination   . GERD (gastroesophageal reflux disease)   . H/O hiatal hernia   . History of basal cell carcinoma excision    X2  . History of breast cancer ONCOLOGIST-- DR Jana Hakim---  NO RECURRANCE   DX 07/2012;  LOW GRADE DCIS  ER+PR+  ----  S/P RIGHT LUMPECTOMY WITH NEGATIVE MARGINS/   RADIATION ENDED 11/2012  . History of chronic bronchitis   . History of colonic polyps   . History of tachycardia    CONTROLLED  WITH ATENOLOL  . Hypertension   . Pelvic pain   . S/P radiation therapy 11/12/12 - 12/05/12   right Breast  . Sinus headache   . Urgency of urination     Past Surgical History:  Procedure Laterality Date  . [place] STUDY N/A 04/03/2016   Procedure: [place] STUDY;  Surgeon: Manus Gunning, MD;  Location: [facility];  Service: Gastroenterology;  Laterality: N/A;  . BREAST LUMPECTOMY Right 10-11-2012   W/ SLN BX  . CARDIAC CATHETERIZATION  09-13-2007  DR Lia Foyer   WELL-PRESERVED LVF/  DIFFUSE SCATTERED CORONARY CALCIFACATION AND ATHEROSCLEROSIS WITHOUT  OBSTRUCTION  . CARDIAC CATHETERIZATION  08-04-2010  DR Angelena Form   NON-OBSTRUCTIVE CAD/  pLAD 40%/  oLAD 30%/  mLAD 30%/  pRCA 30%/  EF 60%  . CARDIOVASCULAR STRESS TEST  06-18-2012  DR McALHANY   LOW RISK NUCLEAR STUDY/  SMALL FIXED AREA OF MODERATELY DECREASED UPTAKE IN ANTEROSEPTAL WALL WHICH MAY BE ARTIFACTUAL/  NO ISCHEMIA/  EF 68%  . COLONOSCOPY  09-29-2010  . CYSTOSCOPY WITH HYDRODISTENSION AND BIOPSY N/A 03/06/2014   Procedure: CYSTOSCOPY/HYDRODISTENSION/ INSTILATION OF MARCAINE AND PYRIDIUM;  Surgeon: Ailene Rud, MD;  Location: [facility];  Service: Urology;  Laterality: N/A;  . ESOPHAGEAL MANOMETRY N/A 04/03/2016   Procedure: ESOPHAGEAL MANOMETRY (EM);  Surgeon: Manus Gunning, MD;  Location: [facility];  Service: Gastroenterology;  Laterality: N/A;  . NASAL SINUS SURGERY  1985  . ORIF RIGHT ANKLE  FX  2006  . REMOVAL VOCAL CORD CYST  FEB 2014  . RIGHT BREAST BX  08-23-2012  . RIGHT HAND SURGERY  X3  LAST ONE 2009   INCLUDES  ORIF RIGHT 5TH FINGER AND REVISION TWICE  . TONSILLECTOMY AND ADENOIDECTOMY  AGE 30  . TOTAL ABDOMINAL HYSTERECTOMY W/ BILATERAL SALPINGOOPHORECTOMY  1982   W/  APPENDECTOMY  . TRANSTHORACIC ECHOCARDIOGRAM  06-24-2012   GRADE I DIASTOLIC DYSFUNCTION/  EF 55-60%/  MILD MR  There were no vitals filed for this visit.      Subjective Assessment - 10/06/2016 0942    Subjective had a little bit of spinning when getting off elliptical.  went to new eye MD - received drops (short term) and feels like her vision is improving.  feels "a good 90%" improved.   Pertinent History mild memory loss, concussion, see snapshot   Currently in Pain? No/denies            [facility] PT Assessment - 10/06/16 1006      Observation/Other Assessments   Focus on Therapeutic Outcomes (FOTO)  70 (30% limited)                     [facility] Adult PT Treatment/Exercise - 2016-10-06 1001      Self-Care   Other Self-Care Comments   progressed HEP; POC and goals of care         Vestibular Treatment/Exercise - 10/06/2016 0959      X1 Viewing Horizontal   Foot Position standing compliant surface   Time --  30 sec   Reps 1   Comments progressed home exercise to compliant surface     X1 Viewing Vertical   Foot Position standing compliant surface   Time --  30 sec   Reps 1   Comments progressed home exercise to compliant surface            Balance Exercises - 2016/10/06 1000      Balance Exercises: Standing   Standing Eyes Closed Wide (BOA);Foam/compliant surface;30 secs                PT Long Term Goals - October 06, 2016 1002      PT LONG TERM GOAL #1   Title independent with HEP (10/11/16)   Status Achieved     PT LONG TERM GOAL #2   Title demonstrate ability to stand 20 sec on compliant surface with EC without LOB for improved vestibular input (10/11/16)   Status Achieved     PT LONG TERM GOAL #3   Title report 75% improvement in symtoms (10/11/16)   Status Achieved               Plan - 10/06/16 1002    Clinical Impression Statement Pt has met all goals and feels dizziness is resolved.  Progressed HEP to standing on compliant surface.  Pt continues to c/o memory deficits and word finding difficulties so recommend SLP eval.  Will hold PT x 30 days; pt to call if vertigo returns.   PT Treatment/Interventions ADLs/Self Care Home Management;Canalith Repostioning;Neuromuscular re-education;Balance training;Therapeutic exercise;Therapeutic activities;Functional mobility training;Stair training;Gait training;Patient/family education;Vestibular   PT Next Visit Plan hold x 30 days; will need renewal and new g code if pt returns   Consulted and Agree with Plan of Care Patient      Patient will benefit from skilled therapeutic intervention in order to improve the following deficits and impairments:  Pain, Decreased balance, Decreased mobility, Difficulty walking, Dizziness  Visit  Diagnosis: Dizziness and giddiness       G-Codes - 10/06/2016 1006    Functional Assessment Tool Used FOTO 30% and clinical judgement   Functional Limitation Changing and maintaining body position   Changing and Maintaining Body Position Goal Status (T0160) At least 1 percent but less than 20 percent impaired, limited or restricted   Changing and Maintaining Body Position Discharge Status (F0932) At least 1 percent but less than 20 percent impaired, limited or restricted  Problem List Patient Active Problem List   Diagnosis Date Noted  . Dysphagia   . Esophageal reflux   . Allergic rhinitis 01/25/2016  . Shortness of breath   . Left hip pain 04/30/2015  . Sciatica 04/30/2015  . Knee pain, right 04/30/2015  . Leukocytes in urine 04/30/2015  . Hyperlipidemia 03/01/2015  . Bilateral hand numbness 03/01/2015  . Pulmonary nodules 02/12/2015  . External hemorrhoids 02/05/2015  . Vaginitis and vulvovaginitis 02/05/2015  . Chronic night sweats 01/08/2015  . Atypical chest pain 01/01/2015  . Chest pain 01/01/2015  . Renal insufficiency 07/16/2014  . Sepsis secondary to UTI ([place]) 02/05/2014  . Grief reaction 12/29/2013  . Urinary hesitancy 12/04/2013  . Abdominal pain, other specified site 10/26/2013  . Headache(784.0) 10/13/2013  . Pain, joint, multiple sites 08/18/2013  . Vasculitis ([place]) 07/29/2013  . ANA positive 07/29/2013  . Other and unspecified hyperlipidemia 07/12/2013  . Contusion, chest wall 04/30/2013  . Malignant neoplasm of lower-outer quadrant of female breast ([place]) 01/03/2013  . Breast cancer, right breast ([place]) 10/17/2012  . Splenic lesion 09/02/2012  . Asthma, allergic 08/05/2012  . GERD (gastroesophageal reflux disease) 06/03/2012  . CAD (coronary artery disease) 06/03/2012  . Former heavy tobacco smoker 06/03/2012  . Edema 04/08/2012  . Stricture and stenosis of esophagus 10/19/2011  . Constipation - functional 07/21/2011  . Nonspecific (abnormal)  findings on radiological and other examination of biliary tract 07/21/2011  . Acute UTI 06/16/2011  . Osteoporosis 04/17/2011  . POSTMENOPAUSAL STATUS 01/27/2011  . CONSTIPATION 11/02/2010  . FIBROCYSTIC BREAST DISEASE, HX OF 10/18/2010  . CAD, NATIVE VESSEL 08/31/2010  . Essential hypertension 08/25/2010  . CAROTID ARTERY DISEASE 08/25/2010  . TOBACCO ABUSE, HX OF 08/25/2010  . GENERALIZED ANXIETY DISORDER 08/03/2010  . FIBROMYALGIA 01/11/2010  . SKIN CANCER, HX OF 01/11/2010       Laureen Abrahams, PT, DPT 09/20/16 10:06 AM    [facility] [address]  [place], [place], [address] Phone: [phone]   Fax:  [phone]  Name: [name] MRN: [id number] Date of Birth: 23-Aug-1946      PHYSICAL THERAPY DISCHARGE SUMMARY  Visits from Start of Care: 4  Current functional level related to goals / functional outcomes: See above; all goals met   Remaining deficits: n/a   Education / Equipment: HEP  Plan: Patient agrees to discharge.  Patient goals were met. Patient is being discharged due to meeting the stated rehab goals.  ?????    Laureen Abrahams, PT, DPT 10/20/16 8:40 AM  [facility] [place], [place] [address]  [phone] (office) [phone] (fax)

## 2016-10-19 ENCOUNTER — Telehealth: Payer: Self-pay | Admitting: *Deleted

## 2016-10-19 NOTE — Telephone Encounter (Signed)
Received vm call from pt stating that she has been trying to coordinate a mammogram & MRI that was supposed to be done in Aug.  She states she last saw Chestine Spore NP & someone was supposed to get back with her but she hasn't heard anything.  Note to Dr Jana Hakim to order.  Last MRI noted was 07/06/14 & last mammo was 08/20/15. Pt has f/u appt 06/10/17.

## 2016-10-20 ENCOUNTER — Ambulatory Visit (INDEPENDENT_AMBULATORY_CARE_PROVIDER_SITE_OTHER): Payer: Medicare Other | Admitting: Gynecology

## 2016-10-20 ENCOUNTER — Encounter: Payer: Self-pay | Admitting: Gynecology

## 2016-10-20 VITALS — BP 120/78 | Ht 63.75 in | Wt 118.0 lb

## 2016-10-20 DIAGNOSIS — Z01411 Encounter for gynecological examination (general) (routine) with abnormal findings: Secondary | ICD-10-CM

## 2016-10-20 DIAGNOSIS — Z1272 Encounter for screening for malignant neoplasm of vagina: Secondary | ICD-10-CM | POA: Diagnosis not present

## 2016-10-20 DIAGNOSIS — Z780 Asymptomatic menopausal state: Secondary | ICD-10-CM

## 2016-10-20 DIAGNOSIS — N952 Postmenopausal atrophic vaginitis: Secondary | ICD-10-CM

## 2016-10-20 DIAGNOSIS — Z9229 Personal history of other drug therapy: Secondary | ICD-10-CM

## 2016-10-20 MED ORDER — ESTRADIOL 10 MCG VA TABS: 1.0000 | ORAL_TABLET | VAGINAL | 11 refills | Status: DC

## 2016-10-20 NOTE — Patient Instructions (Addendum)
Estradiol vaginal tablets What is this medicine? ESTRADIOL (es tra DYE ole) vaginal tablet is used to help relieve symptoms of vaginal irritation and dryness that occurs in some women during menopause. This medicine may be used for other purposes; ask your health care provider or pharmacist if you have questions. What should I tell my health care provider before I take this medicine? They need to know if you have any of these conditions: -abnormal vaginal bleeding -blood vessel disease or blood clots -breast, cervical, endometrial, ovarian, liver, or uterine cancer -dementia -diabetes -gallbladder disease -heart disease or recent heart attack -high blood pressure -high cholesterol -high level of calcium in the blood -hysterectomy -kidney disease -liver disease -migraine headaches -protein C deficiency -protein S deficiency -stroke -systemic lupus erythematosus (SLE) -tobacco smoker -an unusual or allergic reaction to estrogens, other hormones, medicines, foods, dyes, or preservatives -pregnant or trying to get pregnant -breast-feeding How should I use this medicine? This medicine is only for use in the vagina. Do not take by mouth. Wash and dry your hands before and after use. Read package directions carefully. Unwrap the applicator package. Be sure to use a new applicator for each dose. Use at the same time each day. If the tablet has fallen out of the applicator, but is still in the package, carefully place it back into the applicator. If the tablet has fallen out of the package, that applicator should be thrown out and you should use a new applicator containing a new tablet. Lie on your back, part and bend your knees. Gently insert the applicator as far as comfortably possible into the vagina. Then, gently press the plunger until the plunger is fully depressed. This will release the tablet into the vagina. Gently remove the applicator. Throw away the applicator after use. Do not use  your medicine more often than directed. Do not stop using except on the advice of your doctor or health care professional. Talk to your pediatrician regarding the use of this medicine in children. This medicine is not approved for use in children. A patient package insert for the product will be given with each prescription and refill. Read this sheet carefully each time. The sheet may change frequently. Overdosage: If you think you have taken too much of this medicine contact a poison control center or emergency room at once. NOTE: This medicine is only for you. Do not share this medicine with others. What if I miss a dose? If you miss a dose, take it as soon as you can. If it is almost time for your next dose, take only that dose. Do not take double or extra doses. What may interact with this medicine? Do not take this medicine with any of the following medications: -aromatase inhibitors like aminoglutethimide, anastrozole, exemestane, letrozole, testolactone This medicine may also interact with the following medications: -antibiotics used to treat tuberculosis like rifabutin, rifampin and rifapentene -raloxifene or tamoxifen -warfarin This list may not describe all possible interactions. Give your health care provider a list of all the medicines, herbs, non-prescription drugs, or dietary supplements you use. Also tell them if you smoke, drink alcohol, or use illegal drugs. Some items may interact with your medicine. What should I watch for while using this medicine? Visit your health care professional for regular checks on your progress. You will need a regular breast and pelvic exam. You should also discuss the need for regular mammograms with your health care professional, and follow his or her guidelines. This medicine can make  your body retain fluid, making your fingers, hands, or ankles swell. Your blood pressure can go up. Contact your doctor or health care professional if you feel you are  retaining fluid. If you have any reason to think you are pregnant; stop taking this medicine at once and contact your doctor or health care professional. Tobacco smoking increases the risk of getting a blood clot or having a stroke, especially if you are more than 71 years old. You are strongly advised not to smoke. If you wear contact lenses and notice visual changes, or if the lenses begin to feel uncomfortable, consult your eye care specialist. If you are going to have elective surgery, you may need to stop taking this medicine beforehand. Consult your health care professional for advice prior to scheduling the surgery. What side effects may I notice from receiving this medicine? Side effects that you should report to your doctor or health care professional as soon as possible: -allergic reactions like skin rash, itching or hives, swelling of the face, lips, or tongue -breast tissue changes or discharge -changes in vision -chest pain -confusion, trouble speaking or understanding -dark urine -general ill feeling or flu-like symptoms -light-colored stools -nausea, vomiting -pain, swelling, warmth in the leg -right upper belly pain -severe headaches -shortness of breath -sudden numbness or weakness of the face, arm or leg -trouble walking, dizziness, loss of balance or coordination -unusual vaginal bleeding -yellowing of the eyes or skin Side effects that usually do not require medical attention (report to your doctor or health care professional if they continue or are bothersome): -hair loss -increased hunger or thirst -increased urination -symptoms of vaginal infection like itching, irritation or unusual discharge -unusually weak or tired This list may not describe all possible side effects. Call your doctor for medical advice about side effects. You may report side effects to FDA at 1-800-FDA-1088. Where should I keep my medicine? Keep out of the reach of children. Store at room  temperature between 15 and 30 degrees C (59 and 86 degrees F). Throw away any unused medicine after the expiration date. NOTE: This sheet is a summary. It may not cover all possible information. If you have questions about this medicine, talk to your doctor, pharmacist, or health care provider.    2016, Elsevier/Gold Standard. (2014-11-25 09:22:51) Bone Densitometry Bone densitometry is an imaging test that uses a special X-ray to measure the amount of calcium and other minerals in your bones (bone density). This test is also known as a bone mineral density test or dual-energy X-ray absorptiometry (DXA). The test can measure bone density at your hip and your spine. It is similar to having a regular X-ray. You may have this test to:  Diagnose a condition that causes weak or thin bones (osteoporosis).  Predict your risk of a broken bone (fracture).  Determine how well osteoporosis treatment is working. LET [facility] CARE PROVIDER KNOW ABOUT:  Any allergies you have.  All medicines you are taking, including vitamins, herbs, eye drops, creams, and over-the-counter medicines.  Previous problems you or members of your family have had with the use of anesthetics.  Any blood disorders you have.  Previous surgeries you have had.  Medical conditions you have.  Possibility of pregnancy.  Any other medical test you had within the previous 14 days that used contrast material. RISKS AND COMPLICATIONS Generally, this is a safe procedure. However, problems can occur and may include the following:  This test exposes you to a very small amount  of radiation.  The risks of radiation exposure may be greater to unborn children. BEFORE THE PROCEDURE  Do not take any calcium supplements for 24 hours before having the test. You can otherwise eat and drink what you usually do.  Take off all metal jewelry, eyeglasses, dental appliances, and any other metal objects. PROCEDURE  You may lie on an  exam table. There will be an X-ray generator below you and an imaging device above you.  Other devices, such as boxes or braces, may be used to position your body properly for the scan.  You will need to lie still while the machine slowly scans your body.  The images will show up on a computer monitor. AFTER THE PROCEDURE You may need more testing at a later time.   This information is not intended to replace advice given to you by your health care provider. Make sure you discuss any questions you have with your health care provider.   Document Released: 01/02/2005 Document Revised: 01/01/2015 Document Reviewed: 05/21/2014 Elsevier Interactive Patient Education [employer].

## 2016-10-20 NOTE — Progress Notes (Addendum)
History:    70 y.o.  for annual gyn exam who has not been seen the office since 2015. Her PCP is Dr. Maudie Mercury who is been doing her blood work. Patient is complaining of vaginal dryness and had some slight bleed after intercourse recently been no recurrence. Review of her record indicated that she is a  gravida 3 para 3 who had stated at 31 years ago she had a total abdominal hysterectomy with bilateral salpingo-oophorectomy for endometriosis but then in 1991 she was treated for cervical cancer?? And had radiation treatment and 5 FFU? Her history is not clear and we are awaiting copy of her records. On further questioning the patient stated that the only radiation she has received has been for her breast cancer.Patient does have history of right lumpectomy in 2013 for low-grade ductal carcinoma in situ, estrogen and progesterone receptor positive with negative margins. She completed radiation therapy in December 2013 and subsequently had been started on tamoxifen upon completion of radiation but she could not tolerate it and is currently on no other treatment. Patient stated many years ago she has some papillomas virus on her Pap smear I do not have records that she would like to have a Pap smear. She had a bone density study done in 2015 in another facility she wasn't sure if she had osteopenia or osteoporosis and we have no records. Her colonoscopy in 2017 demonstrated benign colon polyps and she's on 5 year recall.  Past medical history,surgical history, family history and social history were all reviewed and documented in the EPIC chart.  Gynecologic History No LMP recorded. Patient has had a hysterectomy. Contraception: status post hysterectomy Last Pap: 2014. Results were: normal Last mammogram: 2016. Results were: normal  Obstetric History OB History  Gravida Para Term Preterm AB Living  3 3       3   SAB TAB Ectopic Multiple Live Births               # Outcome Date GA Lbr Len/2nd Weight Sex  Delivery Anes PTL Lv  3 Para           2 Para           1 Para                ROS: A ROS was performed and pertinent positives and negatives are included in the history.  GENERAL: No fevers or chills. HEENT: No change in vision, no earache, sore throat or sinus congestion. NECK: No pain or stiffness. CARDIOVASCULAR: No chest pain or pressure. No palpitations. PULMONARY: No shortness of breath, cough or wheeze. GASTROINTESTINAL: No abdominal pain, nausea, vomiting or diarrhea, melena or bright red blood per rectum. GENITOURINARY: No urinary frequency, urgency, hesitancy or dysuria. MUSCULOSKELETAL: No joint or muscle pain, no back pain, no recent trauma. DERMATOLOGIC: No rash, no itching, no lesions. ENDOCRINE: No polyuria, polydipsia, no heat or cold intolerance. No recent change in weight. HEMATOLOGICAL: No anemia or easy bruising or bleeding. NEUROLOGIC: No headache, seizures, numbness, tingling or weakness. PSYCHIATRIC: No depression, no loss of interest in normal activity or change in sleep pattern.     Exam: chaperone present  BP 120/78   Ht 5' 3.75" (1.619 m)   Wt 118 lb (53.5 kg)   BMI 20.41 kg/m   Body mass index is 20.41 kg/m.  General appearance : Well developed well nourished female. No acute distress HEENT: Eyes: no retinal hemorrhage or exudates,  Neck supple, trachea midline, no carotid  bruits, no thyroidmegaly Lungs: Clear to auscultation, no rhonchi or wheezes, or rib retractions  Heart: Regular rate and rhythm, no murmurs or gallops Breast:Examined in sitting and supine position were symmetrical in appearance, no palpable masses or tenderness,  no skin retraction, no nipple inversion, no nipple discharge, no skin discoloration, no axillary or supraclavicular lymphadenopathy Abdomen: no palpable masses or tenderness, no rebound or guarding Extremities: no edema or skin discoloration or tenderness  Pelvic:  Bartholin, Urethra, Skene Glands: Within normal limits              Vagina: No gross lesions or discharge, severely atrophic  Cervix: Absent  Uterus  absent  Adnexa  Without masses or tenderness  Anus and perineum  normal   Rectovaginal  normal sphincter tone without palpated masses or tenderness             Hemoccult PCP provides     Assessment/Plan:  70 y.o. female for annual exam with severe vaginal atrophy which may have contributed after recent attempted intercourse she had some bleeding. Patient with prior hysterectomy.Patient with prior history of right lumpectomy in 2013 for low-grade ductal carcinoma in situ, estrogen and progesterone receptor positive with negative margins. Completed radiation therapy 2013 no longer on tamoxifen due to the fact that she could not tolerated currently on no medication but close followup by her medical oncologist. We had discussed the recent medical literature on ultra low estrogen vaginal tablet twice a week with minimal if any absorption would benefit this patient with a severe symptomatic vaginal atrophy. I had spoken with her oncologist Dr. Jana Hakim and he had mentioned that there is no data on patients   not on tamoxifen with history of breast cancer whether there is any potential "feeding" of any remaining cancer cells?. He did agree that if patient's quality of life is being affected  And  that she fully understands that the risk is minimal although uncertain as to the amount of risk since there is not enough data in this population to  know that he did not have any reservations for her to be started on it. I agree with her medical oncologist as well. Patient fully understands and accepts the risk. She was represcribed once again Vagifem 10 g twice a week. A Pap smear was done today. PCP doing her blood work. She will schedule a bone density study in a mammogram.   Terrance Mass MD, 10:42 AM 10/20/2016

## 2016-10-23 ENCOUNTER — Other Ambulatory Visit: Payer: Self-pay

## 2016-10-23 DIAGNOSIS — Z170 Estrogen receptor positive status [ER+]: Principal | ICD-10-CM

## 2016-10-23 DIAGNOSIS — C50411 Malignant neoplasm of upper-outer quadrant of right female breast: Secondary | ICD-10-CM

## 2016-10-24 LAB — PAP, TP IMAGING W/ HPV RNA, RFLX HPV TYPE 16,18/45: HPV MRNA, HIGH RISK: NOT DETECTED

## 2016-10-31 ENCOUNTER — Ambulatory Visit (INDEPENDENT_AMBULATORY_CARE_PROVIDER_SITE_OTHER): Payer: Medicare Other | Admitting: Neurology

## 2016-10-31 ENCOUNTER — Encounter: Payer: Self-pay | Admitting: Neurology

## 2016-10-31 VITALS — BP 138/84 | HR 76 | Ht 63.75 in | Wt 118.1 lb

## 2016-10-31 DIAGNOSIS — G4710 Hypersomnia, unspecified: Secondary | ICD-10-CM

## 2016-10-31 DIAGNOSIS — R519 Headache, unspecified: Secondary | ICD-10-CM

## 2016-10-31 DIAGNOSIS — R0683 Snoring: Secondary | ICD-10-CM

## 2016-10-31 DIAGNOSIS — R51 Headache: Secondary | ICD-10-CM

## 2016-10-31 NOTE — Progress Notes (Signed)
[facility]   Referring Provider: Jani Gravel, MD Primary Care Physician:  Jani Gravel, MD  CC:  Memory problems, balance issues, vertigo, falls, tremor, fibromyalgia, bilateral CTS  Snoring wakes her up, she snore heavilty, she has morning headache, dry mouth. Daytime fatigue. She did vestibular therapy for her chronic dizziness. Discussed her normal neurocognitive testing which did not show neurodegenerative disease.   REFERRING DIAGNOSIS:  Memory loss  FINAL DIAGNOSES:  Normal Cognitive Functioning Fibromyalgia Unspecified Depressive Disorder (well-controlled) Unspecified Anxiety Disorder (well-controlled)  RECOMMENDATIONS: ? Follow-up with Dr. Jaynee Eagles is recommended as needed.  ? No treatment with nootropic medication warranted.  ? Continued follow-up regarding her psychopharmacological medications is warranted.  ? Patient may wish to consider a medication review with one of her care providers to help determine if any changes would be appropriate (and not medically contraindicated) that could help optimize cognition.  ? Follow-up, as needed, with the [facility]) is recommended for routine appointments, educational purposes, psychosocial concerns, and brief neurocognitive testing as necessary. ? Neuropsychological re-evaluation is recommended as needed to monitor treatment efficacy, to assist with the management of the patient (start or continue rehab or pharmacological therapy), to determine any clinical and functional significance of brain abnormality over time, as well as to document any potential improvement or decline in cognitive functioning. Lastly, any follow-up testing will help delineate the specific cognitive basis of any new functional complaints.   The following are several strategies that may help compensate for any real or perceived cognitive deficits. These are to be used as applicable. I will also provide additional  information with strategies for memory loss and attention issues. ? Performance will generally be best in a structured, routine, and familiar environment, as opposed to situations involving complex problems.  ? To the extent possible, multitasking should be avoided. And if there are difficulties in organization and planning, maintaining a daily organizer to help keep track of important appointments and information may be beneficial.  ? Memory problems may at least be minimally addressed using compensatory strategies such as the use of a daily schedule to follow, memos, portable recorder, a centrally located bulletin board, or memory notebook.  ? To aid in managing perceived problems with attention, she may consider using some of the following strategies: o The patient should simplify tasks. There may be a need to break overly complex activities into simple step-by-step tasks, keep these steps written down in a note book and then check them off as they are completed which will help to stay on task and make sure the whole task is finished.  o The patient should set deadlines for everything, even for seemingly small tasks, prioritize time-sensitive tasks and write down every assignment, message, or important thought. o The patient is encouraged to use timers and alarms to stay on track and take breaks at regular intervals. Avoid piles of paperwork or procrastination by dealing with each item as it comes in.  Mrs. Kraft is encouraged to attend to lifestyle factors for brain health (e.g., regular physical exercise, good nutrition habits, regular participation in cognitively-stimulating activities, and general stress management techniques), which are likely to have benefits for both emotional adjustment and cognition. In fact, in addition to promoting general good health, regular exercise incorporating aerobic activities (e.g., brisk walking, jogging, bicycling, etc.) has been demonstrated to be a very effective  treatment for depression and stress, with similar efficacy rates to both antidepressant medication and psychotherapy. And for those with orthopedic  issues, water aerobics may be particularly beneficial.  HPI:  [name] is a 70 y.o. female here as a referral from Dr. Maudie Mercury for memory loss. PMHx right breast cancer s/p radiation therapy 2013, chronic pain and Fibromyalgia, HTN, COPD, arthritis, fatigue, hyperlipidemia, insomnia, leg and back pain and lumbar radiculopathy, anxiety, vertigo,  nutcracker esophagus, coronary artery disease. She has had memory loss a year ago, she cant pull out words, words just don;t come, difficulty spelling, she has started forgetting how to get places and gone up the wrong ramp, getting lost. Slowly progressive. She fell and hit her head 31/2 weeks ago and hit it really hard. The first time she fell she must missed the seat. The second time she just fell, doesn't know if the hip went out, she was not clear headed. She discusses balance problems, not doing yoga, she has hip and back problems.   Memory: Memory is slowly progressive. She is getting very anxious about it. Difficulty carrying on a conversation. There is also some confusion. Aunts with dementia. Difficulty spelling. She may have had neurocognitive testing in the past as she remembers spo maybe she has had complaints for longer. She forgets things, can;t get words out. No Fhx memory loss.   Vertigo: the morning before she got up she felt dizzy. Balance problems, she has had difficulty with vision since cataract surgery. The morning before she fell. She got up and opened her eyes and had some room spinning, if she moved the room was spinning. Laying in bed the light was spinning. She has had vertigo before, she was seen by therapy in the past. She was diagnosed with BPPV and after they did exercise. Moving eyes to right bothers her.   Reviewed notes, labs and imaging from outside physicians, which showed:  B12  and TSH normal 02/2015. Hgba1c 5.8 02/2015.   MRI of the brain 09/2015: personally reviewed images and agree with the following. Would also add some generalized mild atrophy more pronounced in the mesial temporal areas.   FINDINGS: There is no evidence of acute infarct, intracranial hemorrhage, mass, midline shift, or extra-axial fluid collection. Ventricles and sulci are within normal limits for age. Punctate foci of T2 hyperintensity are again seen scattered throughout the subcortical and deep cerebral white matter bilaterally, not significantly changed and nonspecific but compatible with mild chronic small vessel ischemic disease.  Prior bilateral cataract extraction is noted. Paranasal sinuses and mastoid air cells are clear. Major intracranial vascular flow voids are preserved.  IMPRESSION: 1. No acute intracranial abnormality or mass. 2. Mild chronic small vessel ischemic disease.  Reviewed notes from primary care. 4 months ago developed numbness of hands. Fingers of both hands are equally affected. Also has a toes. Says they're always cold. She still has strength in the hands. Has a history of bulging disc. She has seen a neurologist in the past Dr. Sabra Heck and was diagnosed with CTS. She says she clenches her hands. Says her memory is terrible. Also reports some memory loss, there was a full where she hurt her shoulder, had vertigo hit her head, noticed some memory loss even before this fall. She tried bilateral wrist immobilizers at nighttime. Cold misdiagnosed for Raynaud's. Asked for evaluation of bilateral hand numbness and memory loss. Scheduled for vestibular therapy for vertigo. Status post fall asked to start using a cane to ambulate.    Review of Systems: Patient complains of symptoms per HPI as well as the following symptoms: Chills, fatigue, blurred vision, loss of  vision, snoring, feeling cold, flushing, ringing in ears, spinning sensation, trouble swallowing,  constipation, joint pain, joint swelling, cramps, aching muscles, memory loss, headache, numbness, weakness, slurred speech, difficulty swallowing, dizziness, tremor, insomnia, snoring, restless legs, depression, anxiety, racing thoughts. Pertinent negatives per HPI. All others negative.    Social History   Social History  . Marital status: Married    Spouse name: Jori Moll   . Number of children: 3  . Years of education: BA   Occupational History  . Retired Therapist, sports Retired   Social History Main Topics  . Smoking status: Former Smoker    Packs/day: 1.00    Years: 15.00    Types: Cigarettes    Quit date: 05/27/2012  . Smokeless tobacco: Never Used  . Alcohol use 0.0 oz/week     Comment: occasional  . Drug use: No  . Sexual activity: Not on file   Other Topics Concern  . Not on file   Social History Narrative   Lives with husband.   Caffeine use: 1/2 cup per day   Exercise-- 2days a week [employer],  Water aerobics, walking     Family History  Problem Relation Age of Onset  . Rectal cancer Mother   . Cancer Mother     rectal,  breast, pancreatic  . Colon cancer Mother   . Cancer Maternal Aunt     breast  . Irritable bowel syndrome Son   . Irritable bowel syndrome Daughter   . Heart disease Other   . Arthritis Other   . Cancer Other     breast cancer 1st degree relative <50, lung  . Diabetes Other     1st degree relative   . Hyperlipidemia Other   . Hypertension Other   . Osteoporosis Other   . Coronary artery disease Other 65  . Colon cancer Father     Past Medical History:  Diagnosis Date  . Arthritis   . Asthma   . CAD (coronary artery disease) CARDIOLOGIST--  DR Angelena Form   mild non-obstructive cad  . Chronic constipation   . COPD (chronic obstructive pulmonary disease) ([place])   . Fibromyalgia   . Frequency of urination   . GERD (gastroesophageal reflux disease)   . H/O hiatal hernia   . History of basal cell carcinoma excision    X2  . History of breast  cancer ONCOLOGIST-- DR Jana Hakim---  NO RECURRANCE   DX 07/2012;  LOW GRADE DCIS  ER+PR+  ----  S/P RIGHT LUMPECTOMY WITH NEGATIVE MARGINS/   RADIATION ENDED 11/2012  . History of chronic bronchitis   . History of colonic polyps   . History of tachycardia    CONTROLLED  WITH ATENOLOL  . Hypertension   . Pelvic pain   . S/P radiation therapy 11/12/12 - 12/05/12   right Breast  . Sinus headache   . Urgency of urination     Past Surgical History:  Procedure Laterality Date  . [place] STUDY N/A 04/03/2016   Procedure: [place] STUDY;  Surgeon: Manus Gunning, MD;  Location: [facility];  Service: Gastroenterology;  Laterality: N/A;  . BREAST LUMPECTOMY Right 10-11-2012   W/ SLN BX  . CARDIAC CATHETERIZATION  09-13-2007  DR Lia Foyer   WELL-PRESERVED LVF/  DIFFUSE SCATTERED CORONARY CALCIFACATION AND ATHEROSCLEROSIS WITHOUT OBSTRUCTION  . CARDIAC CATHETERIZATION  08-04-2010  DR Angelena Form   NON-OBSTRUCTIVE CAD/  pLAD 40%/  oLAD 30%/  mLAD 30%/  pRCA 30%/  EF 60%  . CARDIOVASCULAR STRESS TEST  06-18-2012  DR Angelena Form   LOW RISK NUCLEAR STUDY/  SMALL FIXED AREA OF MODERATELY DECREASED UPTAKE IN ANTEROSEPTAL WALL WHICH MAY BE ARTIFACTUAL/  NO ISCHEMIA/  EF 68%  . COLONOSCOPY  09-29-2010  . CYSTOSCOPY WITH HYDRODISTENSION AND BIOPSY N/A 03/06/2014   Procedure: CYSTOSCOPY/HYDRODISTENSION/ INSTILATION OF MARCAINE AND PYRIDIUM;  Surgeon: Ailene Rud, MD;  Location: [facility];  Service: Urology;  Laterality: N/A;  . ESOPHAGEAL MANOMETRY N/A 04/03/2016   Procedure: ESOPHAGEAL MANOMETRY (EM);  Surgeon: Manus Gunning, MD;  Location: [facility];  Service: Gastroenterology;  Laterality: N/A;  . NASAL SINUS SURGERY  1985  . ORIF RIGHT ANKLE  FX  2006  . REMOVAL VOCAL CORD CYST  FEB 2014  . RIGHT BREAST BX  08-23-2012  . RIGHT HAND SURGERY  X3  LAST ONE 2009   INCLUDES  ORIF RIGHT 5TH FINGER AND REVISION TWICE  . TONSILLECTOMY AND ADENOIDECTOMY  AGE 16  .  TOTAL ABDOMINAL HYSTERECTOMY W/ BILATERAL SALPINGOOPHORECTOMY  1982   W/  APPENDECTOMY  . TRANSTHORACIC ECHOCARDIOGRAM  06-24-2012   GRADE I DIASTOLIC DYSFUNCTION/  EF 55-60%/  MILD MR    Current Outpatient Prescriptions  Medication Sig Dispense Refill  . acetaminophen (TYLENOL) 500 MG tablet Take 1,000 mg by mouth every 6 (six) hours as needed for mild pain or headache.     . albuterol (PROVENTIL HFA;VENTOLIN HFA) 108 (90 BASE) MCG/ACT inhaler Inhale 2 puffs into the lungs every 4 (four) hours as needed. 1 Inhaler 5  . aspirin EC 81 MG tablet Take 1 tablet (81 mg total) by mouth daily. 90 tablet 3  . atenolol (TENORMIN) 25 MG tablet Take 25 mg by mouth daily.    . DULoxetine (CYMBALTA) 60 MG capsule TAKE 1 BY MOUTH TWICE DAILY 180 capsule 1  . ELMIRON 100 MG capsule Take 1 capsule by mouth 3 (three) times daily as needed. Reported on 01/05/2016    . Estradiol 10 MCG TABS vaginal tablet Place 1 tablet (10 mcg total) vaginally 2 (two) times a week. 8 tablet 11  . fluticasone (FLONASE) 50 MCG/ACT nasal spray Place 1 spray into the nose as needed for allergies.     . furosemide (LASIX) 20 MG tablet Take 1 tablet (20 mg total) by mouth every morning. 180 tablet 1  . gabapentin (NEURONTIN) 300 MG capsule Take 1 capsule (300 mg total) by mouth 2 (two) times daily. (Patient taking differently: Take 300 mg by mouth 3 (three) times daily. ) 60 capsule 6  . loratadine (CLARITIN) 10 MG tablet Take 1 tablet (10 mg total) by mouth daily. 30 tablet 11  . losartan (COZAAR) 25 MG tablet Take 25 mg by mouth daily.    Marland Kitchen omeprazole (PRILOSEC) 40 MG capsule Take 40 mg by mouth 2 (two) times daily.    . traMADol (ULTRAM) 50 MG tablet Take 1-2 tablets (50-100 mg total) by mouth 3 (three) times daily as needed for moderate pain. 180 tablet 0  . traZODone (DESYREL) 100 MG tablet Take 1 tablet (100 mg total) by mouth at bedtime. 180 tablet 1  . URELLE (URELLE/URISED) 81 MG TABS tablet Take 1 tablet (81 mg total) by  mouth 3 (three) times daily. (Patient taking differently: Take 1 tablet by mouth 3 (three) times daily as needed for bladder spasms. ) 30 each 2  . Probiotic Product (ACIDOPHILUS PROBIOTIC COMPLEX PO) Take 1 capsule by mouth daily.     No current facility-administered medications for this visit.    Facility-Administered  Medications Ordered in Other Visits  Medication Dose Route Frequency Provider Last Rate Last Dose  . bupivacaine (MARCAINE) 0.5 % 10 mL, triamcinolone acetonide (KENALOG-40) 40 mg injection   Subcutaneous Once Carolan Clines, MD      . bupivacaine (MARCAINE) 0.5 % 15 mL, phenazopyridine (PYRIDIUM) 400 mg bladder mixture   Bladder Instillation Once Carolan Clines, MD        Allergies as of 10/31/2016 - Review Complete 10/31/2016  Allergen Reaction Noted  . Clindamycin hcl Shortness Of Breath and Rash 04/03/2011  . Penicillins Anaphylaxis 01/11/2010  . Prednisone Shortness Of Breath and Rash 06/16/2011  . Codeine Hives and Other (See Comments) 01/11/2010  . Fluzone [flu virus vaccine] Other (See Comments) 09/29/2014  . Latex Hives 08/29/2012  . Pentazocine lactate Other (See Comments) 01/11/2010  . Pneumococcal vaccine polyvalent Hives, Swelling, and Other (See Comments) 01/31/2011  . Tamoxifen Nausea And Vomiting and Other (See Comments) 03/03/2014    Vitals: BP 138/84 (BP Location: Left Arm, Patient Position: Sitting, Cuff Size: Normal)   Pulse 76   Ht 5' 3.75" (1.619 m)   Wt 118 lb 1.6 oz (53.6 kg)   BMI 20.43 kg/m  Last Weight:  Wt Readings from Last 1 Encounters:  10/31/16 118 lb 1.6 oz (53.6 kg)   Last Height:   Ht Readings from Last 1 Encounters:  10/31/16 5' 3.75" (1.619 m)    Physical exam: Exam: Gen: NAD, conversant, well nourised, well groomed                     CV: RRR, no MRG. No Carotid Bruits. No peripheral edema, warm, nontender Eyes: Conjunctivae clear without exudates or hemorrhage  Neuro: Detailed Neurologic Exam  Speech:     Speech is normal; fluent and spontaneous with normal comprehension.  Cognition:  MMSE - Mini Mental State Exam 07/26/2016  Orientation to time 4  Orientation to Place 4  Registration 3  Attention/ Calculation 5  Recall 2  Language- name 2 objects 2  Language- repeat 1  Language- follow 3 step command 2  Language- read & follow direction 1  Write a sentence 1  Copy design 0  Total score 25      The patient is oriented to person, place, and time;     recent and remote memory intact;     language fluent;     normal attention, concentration,     fund of knowledge Cranial Nerves:    The pupils are equal, round, and reactive to light. The fundi are normal and spontaneous venous pulsations are present. Visual fields are full to finger confrontation. Extraocular movements are intact. Trigeminal sensation is intact and the muscles of mastication are normal. The palate elevates in the midline. Hearing intact. Voice is normal. Shoulder shrug is normal. The tongue has normal motion without fasciculations.  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 Normal rapid alternating movements.   Gait:    Heel-toe and tandem gait are intact with imbalance  Motor Observation:    No asymmetry, no atrophy, and no involuntary movements noted. Tone:    Normal muscle tone.    Posture:    Posture is normal. normal erect    Strength: mild hio weakness    Strength is V/V in the upper and lower limbs.      Sensation: intact to LT     Reflex Exam:  DTR's:  Deep tendon reflexes in the upper and lower extremities are normal bilaterally.   Toes:    The toes are downgoing bilaterally.   Clonus:    Clonus is absent.      Assessment/Plan:  70 year old with multiple ocmplaints she wants to address today including Memory problems, balance issues, vertigo, falls, tremor, fibromyalgia, bilateral CTS.   Morning  headache, snoring, excessive fatigue, memory: Will refer for sleep evaluation  Memory complaints,multifactorial: multiple medications that can affect memory including: meclizine, Xanax, tramadol, gabapentin, trazodone, duloxetine which can have side effects of memory changes. Also depression and anxiety and chronic pain contribute. Had a long discussion about memory and these contributing factors. Formal neurocognitive testing showed normal cognitive functioning.   Vertigo and balance and falls: likely BPPV. May have been worsened after hitting her head, discussed post-concussive symptoms as well, fall risk use walking aids and watch for falls. Completed vestibular therapy.   Chronic pain: I suggested Cognitive Behavioral Therapy and biofeedback. They decline due to financial constraints.   Tremor: Mild postural tremor. She has a FHx of tremor. Likely essential tremor. Provided litertaue for her to read.    Sarina Ill, MD  [facility] [address] [place], [place] [address]  Phone [phone] Fax [phone]  A total of 45 minutes was spent face-to-face with this patient. Over half this time was spent on counseling patient on the multiple neurologic complaints diagnosis and different diagnostic and therapeutic options available.

## 2016-10-31 NOTE — Patient Instructions (Addendum)
Remember to drink plenty of fluid, eat healthy meals and do not skip any meals. Try to eat protein with a every meal and eat a healthy snack such as fruit or nuts in between meals. Try to keep a regular sleep-wake schedule and try to exercise daily, particularly in the form of walking, 20-30 minutes a day, if you can.   As far as diagnostic testing: Sleep evaluation  I would like to see you back 4-6 months, sooner if we need to. Please call us with any interim questions, concerns, problems, updates or refill requests.   Our phone number is [phone]. We also have an after hours call service for urgent matters and there is a physician on-call for urgent questions. For any emergencies you know to call 911 or go to the nearest emergency room

## 2016-11-07 ENCOUNTER — Ambulatory Visit
Admission: RE | Admit: 2016-11-07 | Discharge: 2016-11-07 | Disposition: A | Discharge status: Home / Self Care | Payer: Medicare Other | Source: Ambulatory Visit | Attending: Oncology | Admitting: Oncology

## 2016-11-07 ENCOUNTER — Other Ambulatory Visit: Payer: Self-pay | Admitting: Oncology

## 2016-11-07 DIAGNOSIS — Z170 Estrogen receptor positive status [ER+]: Principal | ICD-10-CM

## 2016-11-07 DIAGNOSIS — C50411 Malignant neoplasm of upper-outer quadrant of right female breast: Secondary | ICD-10-CM

## 2016-11-08 ENCOUNTER — Other Ambulatory Visit: Payer: Self-pay | Admitting: *Deleted

## 2016-11-10 ENCOUNTER — Other Ambulatory Visit: Payer: Self-pay | Admitting: Oncology

## 2016-11-10 NOTE — Progress Notes (Unsigned)
ID: [name]   DOB: 1946/12/21  MR#: [id number]  [id number]  PCP: Jani Gravel, MD GYN:  SU: Alphonsa Overall,  OTHER MD: Eppie Gibson, Baltazar Apo, Erskine Emery, Jacques Navy, Madelon Lips Wright,   HISTORY OF PRESENT ILLNESS: From the original intake note:  Savilla noted a minimal discharge from the right breast early and 2013. She did not think much of this until it became more copious him a and then she brought it to Dr. Ashley Murrain attention and bilateral breast MRI 07/29/2012 showed 2 areas of concern in the right breast. Right breast biopsy x3 was obtained 08/23/2012, and showed ([id number]) atypical ductal hyperplasia partially involving a papillary lesion. Accordingly, on 10/11/2012 the patient underwent right lumpectomy for what proved to be ([id number] ductal carcinoma in situ, low-grade, measuring approximately 3.2 cm, with negative margins (0.15 cm), 100% estrogen receptor positive, 61% progesterone receptor positive.  With his results the patient was referred to radiation oncology and she is currently receiving of radiation treatments under Dr. Isidore Moos, scheduled to be completed mid December 2013.  The patient's subsequent history is as detailed below.  INTERVAL HISTORY: Dafne returns today for followup of her noninvasive breast cancer.  She had an episode of pneumonia in December 2015 and after that she came off statins. She says "I have never felt this good in years". She is going to the gym 2 or 3 times a week, mostly doing walking. She was the plan to garden. Her husband has noticed a much better she has been feeling.  REVIEW OF SYSTEMS:  she continues to have problems with sleep. She has pain in her hips and right knee , which is not new. She has cataract problems. Just sinus issues including ringing in her ears, sinus problems, sore throat, and hoarseness. She denies a cough. She developed a right submandibular lymph node possibly related to her sinus problems. She has  heartburn and a hiatal hernia. She has had a couple of "skin cancers" removed by Dr. Allyson Sabal. She has headaches occasionally. These are better. A detailed review of systems today was otherwise stable  PAST MEDICAL HISTORY: Past Medical History:  Diagnosis Date  . Arthritis   . Asthma   . CAD (coronary artery disease) CARDIOLOGIST--  DR Angelena Form   mild non-obstructive cad  . Chronic constipation   . COPD (chronic obstructive pulmonary disease) ([place])   . Fibromyalgia   . Frequency of urination   . GERD (gastroesophageal reflux disease)   . H/O hiatal hernia   . History of basal cell carcinoma excision    X2  . History of breast cancer ONCOLOGIST-- DR Jana Hakim---  NO RECURRANCE   DX 07/2012;  LOW GRADE DCIS  ER+PR+  ----  S/P RIGHT LUMPECTOMY WITH NEGATIVE MARGINS/   RADIATION ENDED 11/2012  . History of chronic bronchitis   . History of colonic polyps   . History of tachycardia    CONTROLLED  WITH ATENOLOL  . Hypertension   . Pelvic pain   . S/P radiation therapy 11/12/12 - 12/05/12   right Breast  . Sinus headache   . Urgency of urination     PAST SURGICAL HISTORY: Past Surgical History:  Procedure Laterality Date  . [place] STUDY N/A 04/03/2016   Procedure: [place] STUDY;  Surgeon: Manus Gunning, MD;  Location: [facility];  Service: Gastroenterology;  Laterality: N/A;  . BREAST LUMPECTOMY Right 10-11-2012   W/ SLN BX  . CARDIAC CATHETERIZATION  09-13-2007  DR  STUCKEY   WELL-PRESERVED LVF/  DIFFUSE SCATTERED CORONARY CALCIFACATION AND ATHEROSCLEROSIS WITHOUT OBSTRUCTION  . CARDIAC CATHETERIZATION  08-04-2010  DR Angelena Form   NON-OBSTRUCTIVE CAD/  pLAD 40%/  oLAD 30%/  mLAD 30%/  pRCA 30%/  EF 60%  . CARDIOVASCULAR STRESS TEST  06-18-2012  DR McALHANY   LOW RISK NUCLEAR STUDY/  SMALL FIXED AREA OF MODERATELY DECREASED UPTAKE IN ANTEROSEPTAL WALL WHICH MAY BE ARTIFACTUAL/  NO ISCHEMIA/  EF 68%  . COLONOSCOPY  09-29-2010  . CYSTOSCOPY WITH HYDRODISTENSION AND  BIOPSY N/A 03/06/2014   Procedure: CYSTOSCOPY/HYDRODISTENSION/ INSTILATION OF MARCAINE AND PYRIDIUM;  Surgeon: Ailene Rud, MD;  [facility];  Service: Urology;  Laterality: N/A;  . ESOPHAGEAL MANOMETRY N/A 04/03/2016   Procedure: ESOPHAGEAL MANOMETRY (EM);  Surgeon: Manus Gunning, MD;  Location: [facility];  Service: Gastroenterology;  Laterality: N/A;  . NASAL SINUS SURGERY  1985  . ORIF RIGHT ANKLE  FX  2006  . REMOVAL VOCAL CORD CYST  FEB 2014  . RIGHT BREAST BX  08-23-2012  . RIGHT HAND SURGERY  X3  LAST ONE 2009   INCLUDES  ORIF RIGHT 5TH FINGER AND REVISION TWICE  . TONSILLECTOMY AND ADENOIDECTOMY  AGE 79  . TOTAL ABDOMINAL HYSTERECTOMY W/ BILATERAL SALPINGOOPHORECTOMY  1982   W/  APPENDECTOMY  . TRANSTHORACIC ECHOCARDIOGRAM  06-24-2012   GRADE I DIASTOLIC DYSFUNCTION/  EF 55-60%/  MILD MR    FAMILY HISTORY Family History  Problem Relation Age of Onset  . Rectal cancer Mother   . Cancer Mother     rectal,  breast, pancreatic  . Colon cancer Mother   . Cancer Maternal Aunt     breast  . Irritable bowel syndrome Son   . Irritable bowel syndrome Daughter   . Heart disease Other   . Arthritis Other   . Cancer Other     breast cancer 1st degree relative <50, lung  . Diabetes Other     1st degree relative   . Hyperlipidemia Other   . Hypertension Other   . Osteoporosis Other   . Coronary artery disease Other 65  . Colon cancer Father     GYNECOLOGIC HISTORY: Menarche age 7, first live birth age 58, TAH/BSO in 71. She was on hormone replacement between 1982 and 2013, at the time of her breast cancer diagnosis.  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 Her husband [name] used to be Insurance underwriter for [employer], now is retired and has helped manage a friend's car lot, something he sees as an location of his ministry. Their children are Minda Ditto, who is an Chief Financial Officer with [employer] in [place]; Skip Estimable, who works as an Radio producer for [employer] and lives in [place]; and Alta, who works as a Radio producer (fourth-grade) in [place].   ADVANCED DIRECTIVES: In place  HEALTH MAINTENANCE: Social History  Substance Use Topics  . Smoking status: Former Smoker    Packs/day: 1.00    Years: 15.00    Types: Cigarettes    Quit date: 05/27/2012  . Smokeless tobacco: Never Used  . Alcohol use 0.0 oz/week     Comment: occasional     Colonoscopy: 09/29/2010 Erskine Emery)  PAP: JAN 2013 ([place])  Bone density: 04/17/2011/ osteoporosis  Lipid panel:  Allergies  Allergen Reactions  . Clindamycin Hcl Shortness Of Breath and Rash  . Penicillins Anaphylaxis  .  Prednisone Shortness Of Breath and Rash  . Codeine Hives and Other (See Comments)    headache  . Fluzone [Flu Virus Vaccine] Other (See Comments)    Local reaction at the site  . Latex Hives  . Pentazocine Lactate Other (See Comments)    HALLUCINATION  . Pneumococcal Vaccine Polyvalent Hives, Swelling and Other (See Comments)    REACTION: redness, swelling, and hives at injection site  . Tamoxifen Nausea And Vomiting and Other (See Comments)    HEADACHE    Current Outpatient Prescriptions  Medication Sig Dispense Refill  . acetaminophen (TYLENOL) 500 MG tablet Take 1,000 mg by mouth every 6 (six) hours as needed for mild pain or headache.     . albuterol (PROVENTIL HFA;VENTOLIN HFA) 108 (90 BASE) MCG/ACT inhaler Inhale 2 puffs into the lungs every 4 (four) hours as needed. 1 Inhaler 5  . aspirin EC 81 MG tablet Take 1 tablet (81 mg total) by mouth daily. 90 tablet 3  . atenolol (TENORMIN) 25 MG tablet Take 25 mg by mouth daily.    . DULoxetine (CYMBALTA) 60 MG capsule TAKE 1 BY MOUTH TWICE DAILY 180 capsule 1  . ELMIRON 100 MG capsule Take 1 capsule by mouth 3 (three) times daily as needed. Reported on 01/05/2016    . Estradiol 10 MCG  TABS vaginal tablet Place 1 tablet (10 mcg total) vaginally 2 (two) times a week. 8 tablet 11  . fluticasone (FLONASE) 50 MCG/ACT nasal spray Place 1 spray into the nose as needed for allergies.     . furosemide (LASIX) 20 MG tablet Take 1 tablet (20 mg total) by mouth every morning. 180 tablet 1  . gabapentin (NEURONTIN) 300 MG capsule Take 1 capsule (300 mg total) by mouth 2 (two) times daily. (Patient taking differently: Take 300 mg by mouth 3 (three) times daily. ) 60 capsule 6  . loratadine (CLARITIN) 10 MG tablet Take 1 tablet (10 mg total) by mouth daily. 30 tablet 11  . losartan (COZAAR) 25 MG tablet Take 25 mg by mouth daily.    Marland Kitchen omeprazole (PRILOSEC) 40 MG capsule Take 40 mg by mouth 2 (two) times daily.    . Probiotic Product (ACIDOPHILUS PROBIOTIC COMPLEX PO) Take 1 capsule by mouth daily.    . traMADol (ULTRAM) 50 MG tablet Take 1-2 tablets (50-100 mg total) by mouth 3 (three) times daily as needed for moderate pain. 180 tablet 0  . traZODone (DESYREL) 100 MG tablet Take 1 tablet (100 mg total) by mouth at bedtime. 180 tablet 1  . URELLE (URELLE/URISED) 81 MG TABS tablet Take 1 tablet (81 mg total) by mouth 3 (three) times daily. (Patient taking differently: Take 1 tablet by mouth 3 (three) times daily as needed for bladder spasms. ) 30 each 2   No current facility-administered medications for this visit.    Facility-Administered Medications Ordered in Other Visits  Medication Dose Route Frequency Provider Last Rate Last Dose  . bupivacaine (MARCAINE) 0.5 % 10 mL, triamcinolone acetonide (KENALOG-40) 40 mg injection   Subcutaneous Once Carolan Clines, MD      . bupivacaine (MARCAINE) 0.5 % 15 mL, phenazopyridine (PYRIDIUM) 400 mg bladder mixture   Bladder Instillation Once Carolan Clines, MD        OBJECTIVE: middle-aged white woman  who appears well There were no vitals filed for this visit.   There is no height or weight on file to calculate BMI.    ECOG FS: 1  Sclerae  unicteric, EOMs intact  Oropharynx clear and moist;   No cervical or supraclavicular adenopathy Lungs no rales or rhonchi Heart regular rate and rhythm Abd soft, nontender, positive bowel sounds MSK no  Focal spinal tenderness,, no upper extremity lymphedema Neuro: nonfocal, well oriented,  positive affect Breasts: The right breast is status post lumpectomy. There is no evidence of local recurrence. The right axilla is benign. The left breast is unremarkable.   LAB RESULTS: Lab Results  Component Value Date   WBC 5.0 04/20/2015   NEUTROABS 3.0 04/20/2015   HGB 11.3 (L) 04/20/2015   HCT 36.0 04/20/2015   MCV 90.9 04/20/2015   PLT 221 04/20/2015      Chemistry      Component Value Date/Time   NA 140 04/20/2015 1050   K 3.9 04/20/2015 1050   CL 102 03/01/2015 1051   CL 103 11/12/2012 1549   CO2 26 04/20/2015 1050   BUN 13.1 04/20/2015 1050   CREATININE 1.1 04/20/2015 1050      Component Value Date/Time   CALCIUM 8.9 04/20/2015 1050   ALKPHOS 87 04/20/2015 1050   AST 23 04/20/2015 1050   ALT 16 04/20/2015 1050   BILITOT 0.26 04/20/2015 1050       No results found for: LABCA2  No components found for: LABCA125  No results for input(s): INR in the last 168 hours.  Urinalysis    Component Value Date/Time   COLORURINE YELLOW 01/06/2014 1219   APPEARANCEUR CLEAR 01/06/2014 1219   LABSPEC 1.015 01/06/2014 1219   PHURINE 7.0 01/06/2014 1219   GLUCOSEU NEG 01/06/2014 1219   GLUCOSEU NEGATIVE 07/19/2011 1457   HGBUR NEG 01/06/2014 1219   BILIRUBINUR Neg 04/30/2015 1153   KETONESUR NEG 01/06/2014 1219   PROTEINUR Neg 04/30/2015 1153   PROTEINUR NEG 01/06/2014 1219   UROBILINOGEN 0.2 04/30/2015 1153   UROBILINOGEN 0.2 01/06/2014 1219   NITRITE Neg 04/30/2015 1153   NITRITE NEG 01/06/2014 1219   LEUKOCYTESUR Trace 04/30/2015 1153    STUDIES: Study Result   Mm Diag Breast Tomo Bilateral  Result Date: 11/07/2016 CLINICAL DATA:  History of right breast ductal  carcinoma in situ diagnosed in 2013. Annual examination. The patient is asymptomatic. EXAM: 2D DIGITAL DIAGNOSTIC BILATERAL MAMMOGRAM WITH CAD AND ADJUNCT TOMO COMPARISON:  Previous exam(s). ACR Breast Density Category c: The breast tissue is heterogeneously dense, which may obscure small masses. FINDINGS: Stable lumpectomy changes in the inferior right breast. Two biopsy clips noted in the right breast, unchanged. No mass, architectural distortion, or suspicious microcalcification is identified in either breast to suggest malignancy. Mammographic images were processed with CAD. IMPRESSION: No evidence of malignancy in either breast. Lumpectomy changes on the right. RECOMMENDATION: Diagnostic mammogram is suggested in 1 year. (Code:[id number]-B-01Y) I have discussed the findings and recommendations with the patient. Results were also provided in writing at the conclusion of the visit. If applicable, a reminder letter will be sent to the patient regarding the next appointment. BI-RADS CATEGORY  2: Benign. Electronically Signed   By: Curlene Dolphin M.D.   On: 11/07/2016 11:22   CLINICAL DATA: Followup pulmonary nodule  EXAM: CT CHEST WITH CONTRAST  TECHNIQUE: Multidetector CT imaging of the chest was performed during intravenous contrast administration.  CONTRAST: 76mL OMNIPAQUE IOHEXOL 300 MG/ML SOLN  COMPARISON: 01/01/2015  FINDINGS: Mediastinum: Normal heart size. There is no pericardial effusion identified. Calcified atherosclerotic disease noted involving the LAD Coronary artery. The trachea is patent and appears midline. There is a normal appearance of the esophagus. No mediastinal or hilar  lymph nodes identified.  Lungs/Pleura: No pleural effusion noted. 5 mm right upper lobe nodule is unchanged from previous exam, image 17 of series 3. Within the basilar portion of a right middle lobe there is a stable scar like opacity, image 30/series 3. No new pulmonary  nodules identified.  Upper Abdomen: Right hepatic lobe hemangioma is again noted, image 62/series 2. The visualized portions of the adrenal glands and spleen are normal.  Musculoskeletal: There are no aggressive lytic or sclerotic bone lesions identified. There is a mild scoliosis deformity and multi level degenerative disc disease.  IMPRESSION: 1. No acute cardiopulmonary abnormalities. 2. Stable 5 mm right upper lobe nodule. If this patient is at increased risk for lung cancer then the next followup exam should be obtained If the patient is at high risk for bronchogenic carcinoma, follow-up chest CT at 6-12 months is recommended. If the patient is at low risk for bronchogenic carcinoma, follow-up chest CT at 12 months is recommended. This recommendation follows the consensus statement: Guidelines for Management of Small Pulmonary Nodules Detected on CT Scans: A Statement from the [place] as published in Radiology 2005;237:395-400.   Electronically Signed  By: Kerby Moors M.D.  On: 02/23/2015 12:29    ASSESSMENT: 70 y.o. J[place] Ser woman s/p Right lumpectomy 10/11/2012 for low-grade ductal carcinoma in situ, estrogen and progesterone receptor positive, with negative though close margins  (1) radiation therapy completed 12/05/2012  (2) tamoxifen started at the completion of radiation, tolerated poorly, discontinued 03/10/2013  (3) breast density category D  PLAN:  Naticia is  2 half years out from her definitive surgery. There is no evidence of disease recurrence. She does have very dense breasts and a significant family history, so I think it is prudent to continue to do yearly MRIs, which I have ordered.   I think she would be a good candidate for our survivorship clinic. I discussed this with her and she is interested. She will see our survivorship nurse practitioner a year from now. She will see me for one final visit 2 years from now.    submandibular lymph nodes or glands are usually related to dental or sinus problems. There are not going to be related to her history of breast cancer which in any case was noninvasive. As far as her very small stable lung nodule, this is followed by pulmonary. She tells me a repeat CT scan has been scheduled for later this year.   I did discuss with her that we have very little data regarding using vaginal estrogens in women with a history of breast cancer. This may be totally safe or it might minimally increase the risk of breast cancer developing very recently have very little data in that setting.  I also gave her information on our "intimacy and pelvic health" program.   Now that she is off statins and feeling much better she is worrying about cholesterol. She could consider red yeast Rice or similar non-studding options. She will discuss this with her primary care physician.   Alaia has a good understanding of the overall plan. She agrees with it. She we'll call with any problems that may develop before her next visit here.    Solon Alban C    11/10/2016

## 2016-11-13 ENCOUNTER — Inpatient Hospital Stay: Admission: RE | Admit: 2016-11-13 | Payer: Medicare Other | Source: Ambulatory Visit

## 2016-11-17 ENCOUNTER — Telehealth: Payer: Self-pay | Admitting: Emergency Medicine

## 2016-11-17 MED ORDER — METHYLPREDNISOLONE 4 MG PO TBPK: ORAL_TABLET | ORAL | 0 refills | Status: DC

## 2016-11-17 MED ORDER — DOXYCYCLINE HYCLATE 100 MG PO TABS: 100.0000 mg | ORAL_TABLET | Freq: Two times a day (BID) | ORAL | 0 refills | Status: DC

## 2016-11-17 NOTE — Telephone Encounter (Signed)
lmtcb X1 for pt  

## 2016-11-17 NOTE — Telephone Encounter (Signed)
Pt having about 10 days of nasal gtt, sore throat. Having a lot of chest congestion now, productive green / yellow. No fever. Suspect that she has an AE-asthma, probable bronchitis.   Will send scripts for doxycycline and Medrol dose pack (does not tolerate prednisone)

## 2016-12-11 ENCOUNTER — Other Ambulatory Visit: Payer: Self-pay | Admitting: Gynecology

## 2016-12-11 ENCOUNTER — Encounter: Payer: Self-pay | Admitting: Gynecology

## 2016-12-11 ENCOUNTER — Ambulatory Visit (INDEPENDENT_AMBULATORY_CARE_PROVIDER_SITE_OTHER): Payer: Medicare Other

## 2016-12-11 DIAGNOSIS — M810 Age-related osteoporosis without current pathological fracture: Secondary | ICD-10-CM

## 2016-12-11 DIAGNOSIS — Z780 Asymptomatic menopausal state: Secondary | ICD-10-CM

## 2016-12-11 DIAGNOSIS — Z9229 Personal history of other drug therapy: Secondary | ICD-10-CM

## 2016-12-14 ENCOUNTER — Other Ambulatory Visit: Payer: Self-pay | Admitting: *Deleted

## 2016-12-14 DIAGNOSIS — M8000XA Age-related osteoporosis with current pathological fracture, unspecified site, initial encounter for fracture: Secondary | ICD-10-CM

## 2016-12-22 ENCOUNTER — Ambulatory Visit
Admission: RE | Admit: 2016-12-22 | Discharge: 2016-12-22 | Disposition: A | Discharge status: Home / Self Care | Payer: Medicare Other | Source: Ambulatory Visit | Attending: Oncology | Admitting: Oncology

## 2016-12-22 DIAGNOSIS — Z170 Estrogen receptor positive status [ER+]: Principal | ICD-10-CM

## 2016-12-22 DIAGNOSIS — C50411 Malignant neoplasm of upper-outer quadrant of right female breast: Secondary | ICD-10-CM

## 2016-12-22 MED ORDER — GADOBENATE DIMEGLUMINE 529 MG/ML IV SOLN
10.0000 mL | Freq: Once | INTRAVENOUS | Status: AC | PRN
  Administered 2016-12-22: 10 mL via INTRAVENOUS

## 2016-12-26 ENCOUNTER — Ambulatory Visit (INDEPENDENT_AMBULATORY_CARE_PROVIDER_SITE_OTHER)
Admission: RE | Admit: 2016-12-26 | Discharge: 2016-12-26 | Disposition: A | Discharge status: Home / Self Care | Payer: Medicare Other | Source: Ambulatory Visit | Attending: Emergency Medicine | Admitting: Emergency Medicine

## 2016-12-26 DIAGNOSIS — R911 Solitary pulmonary nodule: Secondary | ICD-10-CM | POA: Diagnosis not present

## 2016-12-27 ENCOUNTER — Encounter: Payer: Self-pay | Admitting: Oncology

## 2017-01-02 ENCOUNTER — Encounter: Payer: Self-pay | Admitting: Emergency Medicine

## 2017-01-02 ENCOUNTER — Ambulatory Visit (INDEPENDENT_AMBULATORY_CARE_PROVIDER_SITE_OTHER): Payer: Medicare Other | Admitting: Emergency Medicine

## 2017-01-02 DIAGNOSIS — J029 Acute pharyngitis, unspecified: Secondary | ICD-10-CM | POA: Diagnosis not present

## 2017-01-02 DIAGNOSIS — R918 Other nonspecific abnormal finding of lung field: Secondary | ICD-10-CM

## 2017-01-02 DIAGNOSIS — J45909 Unspecified asthma, uncomplicated: Secondary | ICD-10-CM

## 2017-01-02 MED ORDER — DOXYCYCLINE HYCLATE 100 MG PO TABS: 100.0000 mg | ORAL_TABLET | Freq: Two times a day (BID) | ORAL | 0 refills | Status: DC

## 2017-01-02 MED ORDER — METHYLPREDNISOLONE 4 MG PO TBPK: ORAL_TABLET | ORAL | 0 refills | Status: DC

## 2017-01-02 NOTE — Progress Notes (Signed)
Subjective:    Patient ID: [name], female    DOB: May 19, 1946, 71 y.o.   MRN: [id number]  HPI 72 yo woman, former smoker (10-15pk-yrs), hx allergic rhinitis, HTN, GERD, depression, tachyarrhythmia, non-obstructive CAD, per cath 07/2010, s/p Hysterectomy, s/p Throat cyst removal, s/p skin cancer removal in past, admitted to [facility] ED on 06/03/12 with cc of persistent cough, worsening SOB and wheeze, dx with COPD exacerbation, but also some question of whether she may have had cardiac dz - subsequent myoview and TTE have been reassuring [name]). In retrospect she now mentions that she has always been sensitive to fumes, allergies even as a girl - never dx with asthma officially.  Exacerbating factors appear to be fumes, allergies, GERD.  ROV 09/24/12 -- follow up for SOB and cough, w hx throat cyst as above. 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 Nasonex was stopped. She also has seen [facility] regarding recurrence of her breast CA. She underwent PFT today that confirm moderately severe AFL with BD response. She has only intermittent sx, has rarely used albuterol. Her allergies, fumes, are triggers.   ROV 02/12/15 -- follow-up visit for cough and dyspnea. Her spirometry in the past has shown obstruction with a positive bronchodilator response. She has been using albuterol as needed when she flares. She had been well until late 11/2014 when she developed URI sx. She was in the hospital for this - was dealing with cough, mucous, dyspnea. She has a lot of nasal and sinus congestion, has continued to cough. She underwent CT-PA on 12/30/14 that showed RUL nodular opacities. She is having exertional SOB, no wheeze. She is scheduled to undergo a cardiac stress test soon. She is using flonase,   ROV 03/2015 follow-up visit for obstructive lung disease with a positive bronchodilator response. She also has a history of pulmonary nodule that we've been following last CT scan. Her most  recent was done in March 2016 and shows that the 5 mm right upper lobe nodule is stable. She is currently on . She tells me that her breathing is much better. She has been using loratadine and flonase. She has not needed her SABA prn. She is not on a scheduled BD. Cardiac stress testing > low risk test.   ROV 01/25/16 -- Follow-up visit for asthmatic bronchitis as well as a history of a vocal cord hemangioma, allergic rhinitis, chronic cough. She also has a history of a 5 mm right upper lobe pulmonary nodule, last CT scan was March 2016.   She has had cataract surgery that did not restore her vision well.  She has been well until about 2-3 days ago, has been good about using nasal steroid and saline. She is now having sore throat, raspy tight feeling.  She has proair available, almost never uses it. No other flares reported. She is being treated monthly by Dr Wilburn Cornelia with electrocautery.   ROV 03/02/16 -- follow-up visit for history of asthma and chronic bronchitis, chronic cough, allergic rhinitis, and history of a vocal cord hemangioma. We have also followed her with serial CT scans of the chest for a 5 mm right upper lobe nodule. Her most recent scan was 02/28/16 and I personally reviewed the images. This shows some biapical scarring and apparent resolution of her peripheral right upper lobe nodule. There are no new nodules seen. She is experiencing cough and sputum in February and was treated with a course of doxycycline for bronchitis. She  feels that she is doing well, has noticed some exertional SOB up stairs. Also some increased GERD sx on omeprazole bid, to see GI soon. She is doing nasal washes, nasal steroid, loratadine. Following w Dr Wilburn Cornelia.   ROV 08/29/16 -- Patient has a history of asthma and chronic bronchitis, chronic cough, chronic rhinitis. She also has a history of a vocal cord hemangioma followed by Dr. Wilburn Cornelia with ENT. We have also been following a 5 mm right upper lobe nodule, most  recent CT scan March 2017.  She has had a couple of fall since our last visit, is under eval by Neuro for imbalance, vertigo. Her breathing has been doing very well. She did need albuterol some during the Spring allergy season. Minimal albuterol use. Has seen Dr Havery Moros regarding her GERD, is on omeprazole 40mg  bid. She uses loratadine prn, flonase prn.   ROV 01/02/17 -- f/u for hx asthma and chronic cough in the setting of GERD, allergic rhinitis, vocal cord hemangioma that has been followed by ENT. Also with a 5 mm right upper lobe nodule that we have followed with serial scans. Her most recent scan was on 12/26/16 I have personally reviewed. This shows no change in her pulm nodules except for some slight decrease in size consistent with evolving scar. She was treated for an acute exacerbation with antibiotics and Medrol in November 2017. She reports a URI about 2-3 weeks, has continued to have throat soreness, hoarse voice. She coughing up brownish drainage. Severe sore throat. She has albuterol available to use prn. She has restarted flonase for the last month.     PULMONARY FUNCTON TEST 09/24/2012  FVC 2.45  FEV1 1.31  FEV1/FVC 53.5  FVC  % Predicted 81  FEV % Predicted 60  FeF 25-75 .48  FeF 25-75 % Predicted 2.44       Objective:   Physical Exam Vitals:   01/02/17 1111  BP: 108/72  Pulse: 66  SpO2: 96%  Weight: 115 lb (52.2 kg)  Height: 5' 3.5" (1.613 m)   Gen: Pleasant, well-nourished, in no distress,  normal affect  ENT: No lesions,  mouth clear,  oropharynx clear, no postnasal drip  Neck: No JVD, no TMG, no carotid bruits  Lungs: No use of accessory muscles, no dullness to percussion, clear without rales or rhonchi  Cardiovascular: RRR, heart sounds normal, no murmur or gallops, no peripheral edema  Musculoskeletal: No deformities, no cyanosis or clubbing  Neuro: alert, non focal  Skin: Warm, no lesions or rashes  02/23/15 --  Reviewed by me personally COMPARISON:  01/01/2015  FINDINGS: Mediastinum: Normal heart size. There is no pericardial effusion identified. Calcified atherosclerotic disease noted involving the LAD Coronary artery. The trachea is patent and appears midline. There is a normal appearance of the esophagus. No mediastinal or hilar lymph nodes identified.  Lungs/Pleura: No pleural effusion noted. 5 mm right upper lobe nodule is unchanged from previous exam, image 17 of series 3. Within the basilar portion of a right middle lobe there is a stable scar like opacity, image 30/series 3. No new pulmonary nodules identified.  Upper Abdomen: Right hepatic lobe hemangioma is again noted, image 62/series 2. The visualized portions of the adrenal glands and spleen are normal.  Musculoskeletal: There are no aggressive lytic or sclerotic bone lesions identified. There is a mild scoliosis deformity and multi level degenerative disc disease.  IMPRESSION: 1. No acute cardiopulmonary abnormalities. 2. Stable 5 mm right upper lobe nodule. If this patient is at increased  risk for lung cancer then the next followup exam should be obtained If the patient is at high risk for bronchogenic carcinoma, follow-up chest CT at 6-12 months is recommended. If the patient is at low risk for bronchogenic carcinoma, follow-up chest CT at 12 months is recommended. This recommendation follows the consensus statement: Guidelines for Management of Small Pulmonary Nodules Detected on CT Scans: A Statement from the [employer] as published in Radiology 2005;237:395-400       Assessment & Plan:  Pulmonary nodules No interval change on most recent CT scan of the chest. This consistent with benign nodules. She should not need any further CT scans unless she clinically changes.  Asthma, allergic Continue albuterol as needed. No evidence of acute exacerbation at this time.  Acute pharyngitis May be the residual of a recent viral upper respiratory  infection versus a bacterial superinfection. She has significant pharyngeal pain and cough. I'd like to treat her with Medrol and doxycycline, follow her for improvement.  Baltazar Apo, MD, PhD 01/02/2017, 11:41 AM [facility] [phone] or if no answer [phone]

## 2017-01-02 NOTE — Assessment & Plan Note (Signed)
Continue albuterol as needed. No evidence of acute exacerbation at this time.

## 2017-01-02 NOTE — Patient Instructions (Addendum)
We will treat with your doxycycline 100mg  twice a day for 1 week.  Please take medrol dose pack as directed.  Please continue flonase every day Take albuterol 2 puffs up to every 4 hours if needed for shortness of breath.  We will not need to repeat your CT scan unless you develop new symptoms.  Follow with Dr Lamonte Sakai in 4 months or sooner if you have any problems. Arrange to see Dr Wilburn Cornelia once a year to follow your vocal cords

## 2017-01-02 NOTE — Assessment & Plan Note (Signed)
No interval change on most recent CT scan of the chest. This consistent with benign nodules. She should not need any further CT scans unless she clinically changes.

## 2017-01-02 NOTE — Assessment & Plan Note (Signed)
May be the residual of a recent viral upper respiratory infection versus a bacterial superinfection. She has significant pharyngeal pain and cough. I'd like to treat her with Medrol and doxycycline, follow her for improvement.

## 2017-01-04 ENCOUNTER — Ambulatory Visit: Payer: Medicare Other | Admitting: Emergency Medicine

## 2017-01-09 DIAGNOSIS — R05 Cough: Secondary | ICD-10-CM | POA: Insufficient documentation

## 2017-01-09 DIAGNOSIS — R053 Chronic cough: Secondary | ICD-10-CM

## 2017-01-09 HISTORY — DX: Chronic cough: R05.3

## 2017-02-01 ENCOUNTER — Ambulatory Visit (INDEPENDENT_AMBULATORY_CARE_PROVIDER_SITE_OTHER): Payer: Medicare Other | Admitting: Gynecology

## 2017-02-01 ENCOUNTER — Encounter: Payer: Self-pay | Admitting: Gynecology

## 2017-02-01 ENCOUNTER — Telehealth: Payer: Self-pay | Admitting: Gynecology

## 2017-02-01 VITALS — BP 110/80 | Ht 63.0 in | Wt 112.0 lb

## 2017-02-01 DIAGNOSIS — N3010 Interstitial cystitis (chronic) without hematuria: Secondary | ICD-10-CM

## 2017-02-01 DIAGNOSIS — M810 Age-related osteoporosis without current pathological fracture: Secondary | ICD-10-CM

## 2017-02-01 DIAGNOSIS — R300 Dysuria: Secondary | ICD-10-CM

## 2017-02-01 HISTORY — DX: Interstitial cystitis (chronic) without hematuria: N30.10

## 2017-02-01 LAB — CALCIUM: CALCIUM: 8.9 mg/dL (ref 8.6–10.4)

## 2017-02-01 MED ORDER — URELLE 81 MG PO TABS: 1.0000 | ORAL_TABLET | Freq: Three times a day (TID) | ORAL | 2 refills | Status: DC

## 2017-02-01 MED ORDER — PENTOSAN POLYSULFATE SODIUM 100 MG PO CAPS: 100.0000 mg | ORAL_CAPSULE | Freq: Three times a day (TID) | ORAL | 11 refills | Status: DC

## 2017-02-01 NOTE — Patient Instructions (Signed)
Osteoporosis Osteoporosis is the thinning and loss of density in the bones. Osteoporosis makes the bones more brittle, fragile, and likely to break (fracture). Over time, osteoporosis can cause the bones to become so weak that they fracture after a simple fall. The bones most likely to fracture are the bones in the hip, wrist, and spine. What are the causes? The exact cause is not known. What increases the risk? Anyone can develop osteoporosis. You may be at greater risk if you have a family history of the condition or have poor nutrition. You may also have a higher risk if you are:  Female.  50 years old or older.  A smoker.  Not physically active.  White or Asian.  Slender. What are the signs or symptoms? A fracture might be the first sign of the disease, especially if it results from a fall or injury that would not usually cause a bone to break. Other signs and symptoms include:  Low back and neck pain.  Stooped posture.  Height loss. How is this diagnosed? To make a diagnosis, your health care provider may:  Take a medical history.  Perform a physical exam.  Order tests, such as:  A bone mineral density test.  A dual-energy X-ray absorptiometry test. How is this treated? The goal of osteoporosis treatment is to strengthen your bones to reduce your risk of a fracture. Treatment may involve:  Making lifestyle changes, such as:  Eating a diet rich in calcium.  Doing weight-bearing and muscle-strengthening exercises.  Stopping tobacco use.  Limiting alcohol intake.  Taking medicine to slow the process of bone loss or to increase bone density.  Monitoring your levels of calcium and vitamin D. Follow these instructions at home:  Include calcium and vitamin D in your diet. Calcium is important for bone health, and vitamin D helps the body absorb calcium.  Perform weight-bearing and muscle-strengthening exercises as directed by your health care provider.  Do  not use any tobacco products, including cigarettes, chewing tobacco, and electronic cigarettes. If you need help quitting, ask your health care provider.  Limit your alcohol intake.  Take medicines only as directed by your health care provider.  Keep all follow-up visits as directed by your health care provider. This is important.  Take precautions at home to lower your risk of falling, such as:  Keeping rooms well lit and clutter free.  Installing safety rails on stairs.  Using rubber mats in the bathroom and other areas that are often wet or slippery. Get help right away if: You fall or injure yourself. This information is not intended to replace advice given to you by your health care provider. Make sure you discuss any questions you have with your health care provider. Document Released: 09/20/2005 Document Revised: 05/15/2016 Document Reviewed: 05/21/2014 Elsevier Interactive Patient Education  2017 Elsevier Inc.  

## 2017-02-01 NOTE — Progress Notes (Signed)
Patient is a 71 year old that presented to the office to discuss her bone density study. Patient also with urinary frequency she does have a history of chronic interstitial cystitis for which she wanted a prescription refill for her Louisa Second which she takes on a regular basis. Also she's on Elmeron and wanted a refill as well.  Patient was seen the office for annual exam on October 2017 see previous note. Patient with past history of osteoporosis. In the past she had been on Actonel for proximally 5 years by another provider who later switched her to intravenous once a year Reclast for which she was on for 5 years and now she's been on a drug holiday close to 5 years or longer. She has not been taking her calcium and vitamin D on a regular basis. She does have a strong family history of fracture were by both parents had osteoporosis and hip fractures. Patient herself has had several fractures in the past. Patient with past history of breast cancer.  Patient's bone density study December 2018 indicated that her lowest T score was -2.6 at the [facility]. We have no comparison with the previous study she had is no facility in 2015. We had a lengthy discussion on osteoporosis as well as different treatment regimen is as described below and with her past history and family history of osteoporosis and hip fracture she needs to be back on treatment since she's been on a drug holiday for 5 years. Based on her recent bone density study was indicated her bone mass is 25% below normal and she has any time greater risk of a spine fracture. The following treatment options were discussed:  The risk and benefits of Forteo were discussed with the patient today to include a significant risk reduction for vertebral and nontender vertebral fractures. The most common side effects that have been reported include: Dizziness, and leg cramps although generally not severe enough to warrant discontinuation in the majority of patient  on this medication. The black box warning was discussed in depth, which is based on the fact that supra-pharmacological doses of Forteo did increase the risk for benign and malignant bone tumors in rats, though there has not been an observed increase incidence in humans to date, based on over a decade of clinical experience. Patient does not have any relative contraindication to Forteo, including no history of previous therapeutic radiation therapy, active neoplasms involving the skeleton or Paget's disease.  The risk and benefits of Prolia were discussed with the patient today to include a significant risk reduction for vertebral, hip and non-vertebral fractures. Potential risk also discussed were skin advanced to include rash, pruritus, eczema and other skin reactions. The recurrence of infections and clinical studies with PROLIA, including serious infections were also discussed with the patient. In 3 year clinical trial with Prolia although six-year trial data does not show any evidence for any additional or accelerating risk of infection. Other risks that were discussed included a 1 in 1000-10,000 risk of osteonecrosis of the jaw based on current available data  The risk and benefits of IV bisphosphonate were discussed with the patient today to include a significant risk reduction for vertebral, hip and non-vertebral fractures. Potential risk of therapy were also discussed to include a 10-15% chance for a flulike reaction which may last 2-3 days after IV bisphosphonate. We also discussed that longer reactions lasting perhaps weeks to months have been rarely reported to the FDA following IV bisphosphonate treatment. This would also require  symptomatic management. Other potential risk that were discussed included the following: Regular eye irritation, approximately 1 and 1000-10,000 risk for osteonecrosis of the jaw as well as a risk for atypical femoral fracture of approximately 1 in 5000-10,000 based on  current data on oral bisphosphonate. Signs and symptoms of atypical femoral fracture were also discussed and the patient was asked to contact the office if she develops any of these symptoms.  The risk and benefits of oral bisphosphonate therapy were conveyed to the patient in today's visit. Benefits include a significant risk reduction of vertebral, hip and non vertebral fractures. We also discussed potential adverse effects to include precipitation or aggravation of GERD reflux disease as well as the risk of upper GI bleeding although this is reported in the literature to be extremely rare. Other rare adverse effects that were discussed of patient taking oral bisphosphonate included a 1 in 10,000-10,000 risk for osteonecrosis of the jaw, and a risk for atypical femoral fractures of approximately 1 in 5000-10,000. Signs and symptoms of atypical femoral fracture were also discussed and the patient was informed to contact our office if any unusual symptoms were to develop.  The risk and benefits of Evista were discussed with the patient today, including a significant risk reduction for vertebral fractures. Other potential risks discussed with the patient include the most common reported adverse effect which are flushes and leg cramps. We also discussed the risk for DVT (deep venous thrombosis) of approximately 1 in 400, similar to that seen in patients taking oral estrogens. The fact that there also may be an increased risk for fatal stroke in individuals with a history of cardiovascular disease and or risk factors of heart attack and stroke based on the RUTH trial was also discussed with the patient.  I am recommending that she go on a monoclonal antibody such Prolia 60 mg subcutaneous every 6 months. Patient agrees and would like to pursue. We'll going to check her calcium and vitamin D and PTH level today and make arrangements. Would recommend that she be on this regimen for 7 years before going on another  drug holiday. Also repeat a bone density study one year after initiating therapy to assess respond to treatment.  Due to the fact the patient was having some discomfort with urination a urinalysis was ordered pending at time of this dictation. Patient with history of chronic interstitial cystitis texture melena daily basis and she is also on Elmeron for which prescription refill was provided.  Literature information was provided all the above. All questions were answered. Greater than 90% of time was spent counseling cornua eating care for this patient with osteoporosis. Time of consultation 25 minutes

## 2017-02-01 NOTE — Telephone Encounter (Signed)
Patient with osteoporosis needs to start Prolia. See my notes. Labs today  . Note from Dr Toney Rakes

## 2017-02-02 LAB — URINALYSIS W MICROSCOPIC + REFLEX CULTURE
BACTERIA UA: NONE SEEN [HPF]
BILIRUBIN URINE: NEGATIVE
CRYSTALS: NONE SEEN [HPF]
Casts: NONE SEEN [LPF]
GLUCOSE, UA: NEGATIVE
Hgb urine dipstick: NEGATIVE
KETONES UR: NEGATIVE
LEUKOCYTES UA: NEGATIVE
Nitrite: NEGATIVE
PROTEIN: NEGATIVE
RBC / HPF: NONE SEEN RBC/HPF (ref <=2)
Specific Gravity, Urine: 1.009 (ref 1.001–1.035)
Squamous Epithelial / LPF: NONE SEEN [HPF] (ref <=5)
Yeast: NONE SEEN [HPF]
pH: 7.5 (ref 5.0–8.0)

## 2017-02-02 LAB — VITAMIN D 25 HYDROXY (VIT D DEFICIENCY, FRACTURES): Vit D, 25-Hydroxy: 31 ng/mL (ref 30–100)

## 2017-02-03 LAB — URINE CULTURE: ORGANISM ID, BACTERIA: NO GROWTH

## 2017-02-05 ENCOUNTER — Telehealth: Payer: Self-pay | Admitting: Gynecology

## 2017-02-05 NOTE — Telephone Encounter (Signed)
Created second phone call encounter

## 2017-02-05 NOTE — Telephone Encounter (Signed)
Patient with osteoporosis needs to start Prolia. See my notes. Labs today  Note from Dr Toney Rakes. Calcium 8.9  02/01/17  > Complete exam JF  02/01/17  M81.0 Hx actonel and Reclast in the past.

## 2017-02-20 ENCOUNTER — Telehealth: Payer: Self-pay | Admitting: Emergency Medicine

## 2017-02-20 NOTE — Telephone Encounter (Signed)
Her calcium and vit D were done Feb 8 th and they were normal. Lets wait to hear from dentist before administering med.

## 2017-02-20 NOTE — Telephone Encounter (Signed)
PC  To patient,She does not have any information regarding Prolia we will send she will read  Prolia information . She is currently under dental with three crowns and she stated bone lost in jaw. She will contact her dentist regarding Prolia. She also has not received information regarding her lab work and the Vit D and Calcium level. She does not know if she is to start Calcium and adjust Vit D per Dr Toney Rakes. Will send note to Dr Toney Rakes for instructions

## 2017-02-20 NOTE — Telephone Encounter (Signed)
Spoke with pt. She would like to have a copy of her CT chest sent to her PCP. This has been taken care of. Nothing further was needed.

## 2017-02-20 NOTE — Telephone Encounter (Signed)
lmtcb x1 for pt. 

## 2017-02-22 ENCOUNTER — Other Ambulatory Visit: Payer: Self-pay | Admitting: Internal Medicine

## 2017-02-22 DIAGNOSIS — R634 Abnormal weight loss: Secondary | ICD-10-CM

## 2017-02-27 ENCOUNTER — Ambulatory Visit
Admission: RE | Admit: 2017-02-27 | Discharge: 2017-02-27 | Disposition: A | Discharge status: Home / Self Care | Payer: Medicare Other | Source: Ambulatory Visit | Attending: Internal Medicine | Admitting: Internal Medicine

## 2017-02-27 DIAGNOSIS — R634 Abnormal weight loss: Secondary | ICD-10-CM

## 2017-02-27 MED ORDER — IOPAMIDOL (ISOVUE-300) INJECTION 61%
100.0000 mL | Freq: Once | INTRAVENOUS | Status: AC | PRN
  Administered 2017-02-27: 100 mL via INTRAVENOUS

## 2017-02-27 NOTE — Telephone Encounter (Signed)
PC to pt regarding dental information and also insurance benefits information for pt. LM to return phone call.

## 2017-03-07 ENCOUNTER — Encounter: Payer: Self-pay | Admitting: Neurology

## 2017-03-07 ENCOUNTER — Ambulatory Visit (INDEPENDENT_AMBULATORY_CARE_PROVIDER_SITE_OTHER): Payer: Medicare Other | Admitting: Neurology

## 2017-03-07 VITALS — BP 109/66 | HR 72 | Ht 63.5 in | Wt 111.4 lb

## 2017-03-07 DIAGNOSIS — M542 Cervicalgia: Secondary | ICD-10-CM | POA: Diagnosis not present

## 2017-03-07 NOTE — Patient Instructions (Signed)
Remember to drink plenty of fluid, eat healthy meals and do not skip any meals. Try to eat protein with a every meal and eat a healthy snack such as fruit or nuts in between meals. Try to keep a regular sleep-wake schedule and try to exercise daily, particularly in the form of walking, 20-30 minutes a day, if you can.   As far as diagnostic testing: Physical Therapy for musculoskeletal neck pain and dry needling. Sleep evaluation.   Our phone number is [phone]. We also have an after hours call service for urgent matters and there is a physician on-call for urgent questions. For any emergencies you know to call 911 or go to the nearest emergency room   Sleep Apnea Sleep apnea is a condition in which breathing pauses or becomes shallow during sleep. Episodes of sleep apnea usually last 10 seconds or longer, and they may occur as many as 20 times an hour. Sleep apnea disrupts your sleep and keeps your body from getting the rest that it needs. This condition can increase your risk of certain health problems, including:  Heart attack.  Stroke.  Obesity.  Diabetes.  Heart failure.  Irregular heartbeat. There are three kinds of sleep apnea:  Obstructive sleep apnea. This kind is caused by a blocked or collapsed airway.  Central sleep apnea. This kind happens when the part of the brain that controls breathing does not send the correct signals to the muscles that control breathing.  Mixed sleep apnea. This is a combination of obstructive and central sleep apnea. What are the causes? The most common cause of this condition is a collapsed or blocked airway. An airway can collapse or become blocked if:  Your throat muscles are abnormally relaxed.  Your tongue and tonsils are larger than normal.  You are overweight.  Your airway is smaller than normal. What increases the risk? This condition is more likely to develop in people who:  Are overweight.  Smoke.  Have a smaller than  normal airway.  Are elderly.  Are female.  Drink alcohol.  Take sedatives or tranquilizers.  Have a family history of sleep apnea. What are the signs or symptoms? Symptoms of this condition include:  Trouble staying asleep.  Daytime sleepiness and tiredness.  Irritability.  Loud snoring.  Morning headaches.  Trouble concentrating.  Forgetfulness.  Decreased interest in sex.  Unexplained sleepiness.  Mood swings.  Personality changes.  Feelings of depression.  Waking up often during the night to urinate.  Dry mouth.  Sore throat. How is this diagnosed? This condition may be diagnosed with:  A medical history.  A physical exam.  A series of tests that are done while you are sleeping (sleep study). These tests are usually done in a sleep lab, but they may also be done at home. How is this treated? Treatment for this condition aims to restore normal breathing and to ease symptoms during sleep. It may involve managing health issues that can affect breathing, such as high blood pressure or obesity. Treatment may include:  Sleeping on your side.  Using a decongestant if you have nasal congestion.  Avoiding the use of depressants, including alcohol, sedatives, and narcotics.  Losing weight if you are overweight.  Making changes to your diet.  Quitting smoking.  Using a device to open your airway while you sleep, such as:  An oral appliance. This is a custom-made mouthpiece that shifts your lower jaw forward.  A continuous positive airway pressure (CPAP) device. This device delivers  oxygen to your airway through a mask.  A nasal expiratory positive airway pressure (EPAP) device. This device has valves that you put into each nostril.  A bi-level positive airway pressure (BPAP) device. This device delivers oxygen to your airway through a mask.  Surgery if other treatments do not work. During surgery, excess tissue is removed to create a wider airway. It  is important to get treatment for sleep apnea. Without treatment, this condition can lead to:  High blood pressure.  Coronary artery disease.  (Men) An inability to achieve or maintain an erection (impotence).  Reduced thinking abilities. Follow these instructions at home:  Make any lifestyle changes that your health care provider recommends.  Eat a healthy, well-balanced diet.  Take over-the-counter and prescription medicines only as told by your health care provider.  Avoid using depressants, including alcohol, sedatives, and narcotics.  Take steps to lose weight if you are overweight.  If you were given a device to open your airway while you sleep, use it only as told by your health care provider.  Do not use any tobacco products, such as cigarettes, chewing tobacco, and e-cigarettes. If you need help quitting, ask your health care provider.  Keep all follow-up visits as told by your health care provider. This is important. Contact a health care provider if:  The device that you received to open your airway during sleep is uncomfortable or does not seem to be working.  Your symptoms do not improve.  Your symptoms get worse. Get help right away if:  You develop chest pain.  You develop shortness of breath.  You develop discomfort in your back, arms, or stomach.  You have trouble speaking.  You have weakness on one side of your body.  You have drooping in your face. These symptoms may represent a serious problem that is an emergency. Do not wait to see if the symptoms will go away. Get medical help right away. Call your local emergency services (911 in the [place] Do not drive yourself to the hospital. This information is not intended to replace advice given to you by your health care provider. Make sure you discuss any questions you have with your health care provider. Document Released: 12/01/2002 Document Revised: 08/06/2016 Document Reviewed: 09/20/2015 [employer]  Interactive Patient Education  2017 [employer]   Musculoskeletal Pain Musculoskeletal pain is muscle and bone aches and pains. This pain can occur in any part of the body. Follow these instructions at home:  Only take medicines for pain, discomfort, or fever as told by your health care provider.  You may continue all activities unless the activities cause more pain. When the pain lessens, slowly resume normal activities. Gradually increase the intensity and duration of the activities or exercise.  During periods of severe pain, bed rest may be helpful. Lie or sit in any position that is comfortable, but get out of bed and walk around at least every several hours.  If directed, put ice on the injured area.  Put ice in a plastic bag.  Place a towel between your skin and the bag.  Leave the ice on for 20 minutes, 2-3 times a day. Contact a health care provider if:  Your pain is getting worse.  Your pain is not relieved with medicines.  You lose function in the area of the pain if the pain is in your arms, legs, or neck. This information is not intended to replace advice given to you by your health care provider. Make  sure you discuss any questions you have with your health care provider. Document Released: 12/11/2005 Document Revised: 05/23/2016 Document Reviewed: 08/15/2013 [employer] Interactive Patient Education  2017 [employer].

## 2017-03-07 NOTE — Progress Notes (Signed)
[facility]   Referring Provider: Jani Gravel, MD Primary Care Physician: Sheri Gravel, MD  CC: Memory problems, balance issues, vertigo, falls, tremor, fibromyalgia, bilateral CTS, snoring and morning headaches  Interval history 03/07/2017: She has severe pain. Since her 45s she has had pain she hurts all over even to touch the skin. It hurts to touch her skin. It is hurting if she touches any part of her body with the other part. Dr. Maudie Mercury is working on that. Headaches come from the neck. She has "bulging disks". She hurts badly in the neck. She has daily headaches. She has neck pain. She has chronic pain all over. She is frustrated.   Intreval history: Snoring wakes her up, she snore heavilty, she has morning headache, dry mouth. Daytime fatigue. She did vestibular therapy for her chronic dizziness. Discussed her normal neurocognitive testing which did not show neurodegenerative disease.   REFERRING DIAGNOSIS:  Memory loss  FINAL DIAGNOSES:  Normal Cognitive Functioning Fibromyalgia Unspecified Depressive Disorder (well-controlled) Unspecified Anxiety Disorder (well-controlled)  RECOMMENDATIONS: ? Follow-up with Dr. Jaynee [name] is recommended as needed.  ? No treatment with nootropic medication warranted.  ? Continued follow-up regarding her psychopharmacological medications is warranted.  ? Patient may wish to consider a medication review with one of her care providers to help determine if any changes would be appropriate (and not medically contraindicated) that could help optimize cognition.  ? Follow-up, as needed, with the [facility]) is recommended for routine appointments, educational purposes, psychosocial concerns, and brief neurocognitive testing as necessary. ? Neuropsychological re-evaluation is recommended as needed to monitor treatment efficacy, to assist with the management of the patient (start or continue rehab or  pharmacological therapy), to determine any clinical and functional significance of brain abnormality over time, as well as to document any potential improvement or decline in cognitive functioning. Lastly, any follow-up testing will help delineate the specific cognitive basis of any new functional complaints.   The following are several strategies that may help compensate for any real or perceived cognitive deficits. These are to be used as applicable. I will also provide additional information with strategies for memory loss and attention issues. ? Performance will generally be best in a structured, routine, and familiar environment, as opposed to situations involving complex problems.  ? To the extent possible, multitasking should be avoided. And if there are difficulties in organization and planning, maintaining a daily organizer to help keep track of important appointments and information may be beneficial.  ? Memory problems may at least be minimally addressed using compensatory strategies such as the use of a daily schedule to follow, memos, portable recorder, a centrally located bulletin board, or memory notebook.  ? To aid in managing perceived problems with attention, she may consider using some of the following strategies: o The patient should simplify tasks. There may be a need to break overly complex activities into simple step-by-step tasks, keep these steps written down in a note book and then check them off as they are completed which will help to stay on task and make sure the whole task is finished.  o The patient should set deadlines for everything, even for seemingly small tasks, prioritize time-sensitive tasks and write down every assignment, message, or important thought. o The patient is encouraged to use timers and alarms to stay on track and take breaks at regular intervals. Avoid piles of paperwork or procrastination by dealing with each item as it comes in.  [name]  is  encouraged to attend to lifestyle factors for brain health (e.g., regular physical exercise, good nutrition habits, regular participation in cognitively-stimulating activities, and general stress management techniques), which are likely to have benefits for both emotional adjustment and cognition. In fact, in addition to promoting general good health, regular exercise incorporating aerobic activities (e.g., brisk walking, jogging, bicycling, etc.) has been demonstrated to be a very effective treatment for depression and stress, with similar efficacy rates to both antidepressant medication and psychotherapy. And for those with orthopedic issues, water aerobics may be particularly beneficial.  TDV:VOHYW [name] a 71 y.o.femalehere as a referral from Dr. Wonda [name] memory loss. PMHx right breast cancer s/p radiation therapy 2013, chronic pain and Fibromyalgia, HTN, COPD, arthritis, fatigue, hyperlipidemia, insomnia, leg and back pain and lumbar radiculopathy, anxiety, vertigo, nutcracker esophagus, coronary artery disease. She has had memory loss a year ago, she cant pull out words, words just don;t come, difficulty spelling, she has started forgetting how to get places and gone up the wrong ramp, getting lost. Slowly progressive. She fell and hit her head 31/2 weeks ago and hit it really hard. The first time she fell she must missed the seat. The second time she just fell, doesn't know if the hip went out, she was not clear headed. She discusses balance problems, not doing yoga, she has hip and back problems.   Memory: Memory is slowly progressive. She is getting very anxious about it. Difficulty carrying on a conversation. There is also some confusion. Aunts with dementia. Difficulty spelling. She may have had neurocognitive testing in the past as she remembers spo maybe she has had complaints for longer. She forgets things, can;t get words out. No Fhx memory loss.   Vertigo: the morning before she got  up she felt dizzy. Balance problems, she has had difficulty with vision since cataract surgery. The morning before she fell. She got up and opened her eyes and had some room spinning, if she moved the room was spinning. Laying in bed the light was spinning. She has had vertigo before, she was seen by therapy in the past. She was diagnosed with BPPV and after they did exercise. Moving eyes to right bothers her.   Reviewed notes, labs and imaging from outside physicians, which showed:  B12 and TSH normal 02/2015. Hgba1c 5.8 02/2015.   MRI of the brain 09/2015: personally reviewed images and agree with the following. Would also add some generalized mild atrophy more pronounced in the mesial temporal areas.   FINDINGS: There is no evidence of acute infarct, intracranial hemorrhage, mass, midline shift, or extra-axial fluid collection. Ventricles and sulci are within normal limits for age. Punctate foci of T2 hyperintensity are again seen scattered throughout the subcortical and deep cerebral white matter bilaterally, not significantly changed and nonspecific but compatible with mild chronic small vessel ischemic disease.  Prior bilateral cataract extraction is noted. Paranasal sinuses and mastoid air cells are clear. Major intracranial vascular flow voids are preserved.  IMPRESSION: 1. No acute intracranial abnormality or mass. 2. Mild chronic small vessel ischemic disease.  Reviewed notes from primary care. 4 months ago developed numbness of hands. Fingers of both hands are equally affected. Also has a toes. Says they're always cold. She still has strength in the hands. Has a history of bulging disc. She has seen a neurologist in the past Dr. Sabra Heck and was diagnosed with CTS. She says she clenches her hands. Says her memory is terrible. Also reports some memory loss, there was a  full where she hurt her shoulder, had vertigo hit her head, noticed some memory loss even before this fall. She  tried bilateral wrist immobilizers at nighttime. Cold misdiagnosed for Raynaud's. Asked for evaluation of bilateral hand numbness and memory loss. Scheduled for vestibular therapy for vertigo. Status post fall asked to start using a cane to ambulate.    Review of Systems: Patient complains of symptoms per HPI as well as the following symptoms: Chills, fatigue, blurred vision, loss of vision, snoring, feeling cold, flushing, ringing in ears, spinning sensation, trouble swallowing, constipation, joint pain, joint swelling, cramps, aching muscles, memory loss, headache, numbness, weakness, slurred speech, difficulty swallowing, dizziness, tremor, insomnia, snoring, restless legs, depression, anxiety, racing thoughts. Pertinent negatives per HPI. All others negative.   Social History   Social History  . Marital status: Married    Spouse name: Jori Moll   . Number of children: 3  . Years of education: BA   Occupational History  . Retired Therapist, sports Retired   Social History Main Topics  . Smoking status: Former Smoker    Packs/day: 1.00    Years: 15.00    Types: Cigarettes    Quit date: 05/27/2012  . Smokeless tobacco: Never Used  . Alcohol use 0.0 oz/week     Comment: occasional  . Drug use: No  . Sexual activity: Not on file   Other Topics Concern  . Not on file   Social History Narrative   Lives with husband.   Caffeine use: 1/2 cup per day   Exercise-- 2days a week [employer],  Water aerobics, walking     Family History  Problem Relation Age of Onset  . Rectal cancer Mother   . Cancer Mother     rectal,  breast, pancreatic  . Colon cancer Mother   . Cancer Maternal Aunt     breast  . Irritable bowel syndrome Son   . Irritable bowel syndrome Daughter   . Heart disease Other   . Arthritis Other   . Cancer Other     breast cancer 1st degree relative <50, lung  . Diabetes Other     1st degree relative   . Hyperlipidemia Other   . Hypertension Other   . Osteoporosis Other   .  Coronary artery disease Other 65  . Colon cancer Father     Past Medical History:  Diagnosis Date  . Arthritis   . Asthma   . CAD (coronary artery disease) CARDIOLOGIST--  DR Angelena Form   mild non-obstructive cad  . Chronic constipation   . COPD (chronic obstructive pulmonary disease) ([place])   . Fibromyalgia   . Frequency of urination   . GERD (gastroesophageal reflux disease)   . H/O hiatal hernia   . History of basal cell carcinoma excision    X2  . History of breast cancer ONCOLOGIST-- DR Jana Hakim---  NO RECURRANCE   DX 07/2012;  LOW GRADE DCIS  ER+PR+  ----  S/P RIGHT LUMPECTOMY WITH NEGATIVE MARGINS/   RADIATION ENDED 11/2012  . History of chronic bronchitis   . History of colonic polyps   . History of tachycardia    CONTROLLED  WITH ATENOLOL  . Hypertension   . Osteoporosis   . Pelvic pain   . S/P radiation therapy 11/12/12 - 12/05/12   right Breast  . Sinus headache   . Urgency of urination     Past Surgical History:  Procedure Laterality Date  . [place] STUDY N/A 04/03/2016   Procedure: 24 HOUR  [place] STUDY;  Surgeon: Manus Gunning, MD;  Location: [facility];  Service: Gastroenterology;  Laterality: N/A;  . BREAST LUMPECTOMY Right 10-11-2012   W/ SLN BX  . CARDIAC CATHETERIZATION  09-13-2007  DR Lia Foyer   WELL-PRESERVED LVF/  DIFFUSE SCATTERED CORONARY CALCIFACATION AND ATHEROSCLEROSIS WITHOUT OBSTRUCTION  . CARDIAC CATHETERIZATION  08-04-2010  DR Angelena Form   NON-OBSTRUCTIVE CAD/  pLAD 40%/  oLAD 30%/  mLAD 30%/  pRCA 30%/  EF 60%  . CARDIOVASCULAR STRESS TEST  06-18-2012  DR McALHANY   LOW RISK NUCLEAR STUDY/  SMALL FIXED AREA OF MODERATELY DECREASED UPTAKE IN ANTEROSEPTAL WALL WHICH MAY BE ARTIFACTUAL/  NO ISCHEMIA/  EF 68%  . COLONOSCOPY  09-29-2010  . CYSTOSCOPY WITH HYDRODISTENSION AND BIOPSY N/A 03/06/2014   Procedure: CYSTOSCOPY/HYDRODISTENSION/ INSTILATION OF MARCAINE AND PYRIDIUM;  Surgeon: Ailene Rud, MD;  [facility];  Service: Urology;  Laterality: N/A;  . ESOPHAGEAL MANOMETRY N/A 04/03/2016   Procedure: ESOPHAGEAL MANOMETRY (EM);  Surgeon: Manus Gunning, MD;  Location: [facility];  Service: Gastroenterology;  Laterality: N/A;  . NASAL SINUS SURGERY  1985  . ORIF RIGHT ANKLE  FX  2006  . REMOVAL VOCAL CORD CYST  FEB 2014  . RIGHT BREAST BX  08-23-2012  . RIGHT HAND SURGERY  X3  LAST ONE 2009   INCLUDES  ORIF RIGHT 5TH FINGER AND REVISION TWICE  . TONSILLECTOMY AND ADENOIDECTOMY  AGE 29  . TOTAL ABDOMINAL HYSTERECTOMY W/ BILATERAL SALPINGOOPHORECTOMY  1982   W/  APPENDECTOMY  . TRANSTHORACIC ECHOCARDIOGRAM  06-24-2012   GRADE I DIASTOLIC DYSFUNCTION/  EF 55-60%/  MILD MR    Current Outpatient Prescriptions  Medication Sig Dispense Refill  . acetaminophen (TYLENOL) 500 MG tablet Take 1,000 mg by mouth every 6 (six) hours as needed for mild pain or headache.     . albuterol (PROVENTIL HFA;VENTOLIN HFA) 108 (90 BASE) MCG/ACT inhaler Inhale 2 puffs into the lungs every 4 (four) hours as needed. 1 Inhaler 5  . aspirin EC 81 MG tablet Take 1 tablet (81 mg total) by mouth daily. 90 tablet 3  . atenolol (TENORMIN) 25 MG tablet Take 25 mg by mouth daily.    Marland Kitchen doxycycline (VIBRA-TABS) 100 MG tablet Take 1 tablet (100 mg total) by mouth 2 (two) times daily. 14 tablet 0  . DULoxetine (CYMBALTA) 60 MG capsule TAKE 1 BY MOUTH TWICE DAILY 180 capsule 1  . Estradiol 10 MCG TABS vaginal tablet Place 1 tablet (10 mcg total) vaginally 2 (two) times a week. 8 tablet 11  . fluticasone (FLONASE) 50 MCG/ACT nasal spray Place 1 spray into the nose as needed for allergies.     . furosemide (LASIX) 20 MG tablet Take 1 tablet (20 mg total) by mouth every morning. 180 tablet 1  . gabapentin (NEURONTIN) 300 MG capsule Take 1 capsule (300 mg total) by mouth 2 (two) times daily. (Patient taking differently: Take 300 mg by mouth 3 (three) times daily. ) 60 capsule 6  . loratadine (CLARITIN) 10 MG tablet Take 1 tablet  (10 mg total) by mouth daily. 30 tablet 11  . losartan (COZAAR) 25 MG tablet Take 25 mg by mouth daily.    Marland Kitchen omeprazole (PRILOSEC) 40 MG capsule Take 40 mg by mouth 2 (two) times daily.    . pentosan polysulfate (ELMIRON) 100 MG capsule Take 1 capsule (100 mg total) by mouth 3 (three) times daily. Reported on 01/05/2016 (Patient taking differently: Take 100 mg by mouth 3 (  three) times daily with meals as needed. Reported on 01/05/2016) 90 capsule 11  . Probiotic Product (ACIDOPHILUS PROBIOTIC COMPLEX PO) Take 1 capsule by mouth daily.    . traMADol (ULTRAM) 50 MG tablet Take 1-2 tablets (50-100 mg total) by mouth 3 (three) times daily as needed for moderate pain. 180 tablet 0  . traZODone (DESYREL) 100 MG tablet Take 1 tablet (100 mg total) by mouth at bedtime. 180 tablet 1  . URELLE (URELLE/URISED) 81 MG TABS tablet Take 1 tablet (81 mg total) by mouth 3 (three) times daily. 30 each 2   No current facility-administered medications for this visit.    Facility-Administered Medications Ordered in Other Visits  Medication Dose Route Frequency Provider Last Rate Last Dose  . bupivacaine (MARCAINE) 0.5 % 10 mL, triamcinolone acetonide (KENALOG-40) 40 mg injection   Subcutaneous Once Carolan Clines, MD      . bupivacaine (MARCAINE) 0.5 % 15 mL, phenazopyridine (PYRIDIUM) 400 mg bladder mixture   Bladder Instillation Once Carolan Clines, MD        Allergies as of 03/07/2017 - Review Complete 03/07/2017  Allergen Reaction Noted  . Clindamycin hcl Shortness Of Breath and Rash 04/03/2011  . Penicillins Anaphylaxis 01/11/2010  . Prednisone Shortness Of Breath and Rash 06/16/2011  . Codeine Hives and Other (See Comments) 01/11/2010  . Fluzone [flu virus vaccine] Other (See Comments) 09/29/2014  . Latex Hives 08/29/2012  . Pentazocine lactate Other (See Comments) 01/11/2010  . Pneumococcal vaccine polyvalent Hives, Swelling, and Other (See Comments) 01/31/2011  . Tamoxifen Nausea And Vomiting and  Other (See Comments) 03/03/2014    Vitals: BP 109/66   Pulse 72   Ht 5' 3.5" (1.613 m)   Wt 111 lb 6.4 oz (50.5 kg)   BMI 19.42 kg/m  Last Weight:  Wt Readings from Last 1 Encounters:  03/07/17 111 lb 6.4 oz (50.5 kg)   Last Height:   Ht Readings from Last 1 Encounters:  03/07/17 5' 3.5" (1.613 m)    Physical exam: Exam: Gen: NAD, conversant, well nourised, obese, well groomed                     CV: RRR, no MRG. No Carotid Bruits. No peripheral edema, warm, nontender Eyes: Conjunctivae clear without exudates or hemorrhage  Neuro: Detailed Neurologic Exam  Speech:    Speech is normal; fluent and spontaneous with normal comprehension.  Cognition:    The patient is oriented to person, place, and time;     recent and remote memory intact;     language fluent;     normal attention, concentration,     fund of knowledge Cranial Nerves:    The pupils are equal, round, and reactive to light. The fundi are normal and spontaneous venous pulsations are present. Visual fields are full to finger confrontation. Extraocular movements are intact. Trigeminal sensation is intact and the muscles of mastication are normal. The face is symmetric. The palate elevates in the midline. Hearing intact. Voice is normal. Shoulder shrug is normal. The tongue has normal motion without fasciculations.   Coordination:    Normal finger to nose and heel to shin. Normal rapid alternating movements.   Gait:    Heel-toe and tandem gait are normal.   Motor Observation:    No asymmetry, no atrophy, and no involuntary movements noted. Tone:    Normal muscle tone.    Posture:    Posture is normal. normal erect    Strength:    Strength  is V/V in the upper and lower limbs.      Sensation: intact to LT     Reflex Exam:  DTR's:    Deep tendon reflexes in the upper and lower extremities are normal bilaterally.   Toes:    The toes are downgoing bilaterally.   Clonus:    Clonus is absent.       Assessment/Plan:71 year old with multiple ocmplaints she wants to address today including Memory problems, balance issues, vertigo, falls, tremor, fibromyalgia, bilateral CTS.   Morning headache, snoring, excessive fatigue, memory: Will refer for sleep evaluation Dr. Brett Fairy. Headaches are in the morning and at night. She snores heavily.   Memory complaints,multifactorial: multiple medications that can affect memory including: meclizine, Xanax, tramadol, gabapentin, trazodone, duloxetine which can have side effects of memory changes. Also depression and anxiety and chronic pain contribute. Had a long discussion about memory and these contributing factors. Formal neurocognitive testing showed normal cognitive functioning.   Vertigo and balance and falls: likely BPPV. May have been worsened after hitting her head, discussed post-concussive symptoms as well, fall risk use walking aids and watch for falls. Completed vestibular therapy.   Chronic pain: I suggested Cognitive Behavioral Therapy and biofeedback. They decline due to financial constraints.   Tremor: Mild postural tremor. She has a FHx of tremor. Likely essential tremor. Provided litertaue for her to read.   Musculoskeletal neck pain: Tight traps. Causing headaches. Physical Therapy and manual therapy/massage AND dry needling.  Sarina Ill, MD  [facility] [address] [place], [place] [address]  Phone [phone] Fax [phone]  A total of 25 minutes was spent face-to-face with this patient. Over half this time was spent on counseling patient on the multiple neurologic complaints diagnosis and different diagnostic and therapeutic options available.

## 2017-03-14 ENCOUNTER — Encounter: Payer: Self-pay | Admitting: Physical Therapy

## 2017-03-14 ENCOUNTER — Ambulatory Visit (INDEPENDENT_AMBULATORY_CARE_PROVIDER_SITE_OTHER): Payer: Medicare Other | Admitting: Physical Therapy

## 2017-03-14 DIAGNOSIS — M6281 Muscle weakness (generalized): Secondary | ICD-10-CM

## 2017-03-14 DIAGNOSIS — M62838 Other muscle spasm: Secondary | ICD-10-CM | POA: Diagnosis not present

## 2017-03-14 DIAGNOSIS — M542 Cervicalgia: Secondary | ICD-10-CM

## 2017-03-14 NOTE — Patient Instructions (Addendum)
Use  Heat on neck and upper back  Scapular Retraction (Standing)    With arms at sides, pinch shoulder blades together. Repeat __5-10__ times per set. Do __1__ sets per session. Do _6-7___ sessions per day.   Flexibility: Neck Retraction    Pull head straight back, keeping eyes and jaw level. Repeat _5-10___ times per set. Do __1__ sets per session. Do __6-7__ sessions per day.  POSTURAL CORRECTION   THINK of your body as a whole unit, extending from the bottom of your feet to the top of your head. All parts of the body are inter-connected and need to be in good anatomical alignment for the better health and functioning of the whole.   PRACTICE: -anytime, anywhere - e.g., standing in line at the supermarket, after finishing brushing your teeth or combing your hair, while talking on the telephone, waiting at the bus / trolley stop, etc. -elements of postural correction while sitting working at your computer, reading, driving, or other seated activity. -good body alignment when lying down. NOTICE as you practice, your old, bad habits are replaced by new ones that become more natural.  Think Taller    Stand 2-4 inches taller. Imagine a string, golden thread, rope, or steel cable pulling up at the crown of the head. Avoid tilting head back - keep chin parallel to floor. Do not shrug shoulders. Practice frequently during the day.  Scapula Adduction With Pectorals, Low   Stand in doorframe with palms against frame and arms at 45. Step forward and squeeze shoulder blades. Hold _45__ seconds. Repeat _1__ times per session. Do _2__ sessions per day.  Scapula Adduction With Pectorals, Mid-Range   Stand in doorframe with palms against frame and arms at 90. Step forward and squeeze shoulder blades. Hold _45__ seconds. Repeat _1_ times per session. Do __2_ sessions per day.

## 2017-03-14 NOTE — Therapy (Signed)
[place], [place], [address] Phone: [phone]   Fax:  [phone]  Physical Therapy Evaluation  Patient Details  Name: [name] MRN: [id number] Date of Birth: 25-May-1946 Referring Provider: Dr Sarina Ill  Encounter Date: 03/14/2017      PT End of Session - 03/14/17 1134    Visit Number 1   Number of Visits 12   Date for PT Re-Evaluation 04/25/17   PT Start Time 1030   PT Stop Time 1134   PT Time Calculation (min) 64 min   Activity Tolerance Patient tolerated treatment well      Past Medical History:  Diagnosis Date  . Arthritis   . Asthma   . CAD (coronary artery disease) CARDIOLOGIST--  DR Angelena Form   mild non-obstructive cad  . Chronic constipation   . COPD (chronic obstructive pulmonary disease) ([place])   . Fibromyalgia   . Frequency of urination   . GERD (gastroesophageal reflux disease)   . H/O hiatal hernia   . History of basal cell carcinoma excision    X2  . History of breast cancer ONCOLOGIST-- DR Jana Hakim---  NO RECURRANCE   DX 07/2012;  LOW GRADE DCIS  ER+PR+  ----  S/P RIGHT LUMPECTOMY WITH NEGATIVE MARGINS/   RADIATION ENDED 11/2012  . History of chronic bronchitis   . History of colonic polyps   . History of tachycardia    CONTROLLED  WITH ATENOLOL  . Hypertension   . Osteoporosis   . Pelvic pain   . S/P radiation therapy 11/12/12 - 12/05/12   right Breast  . Sinus headache   . Urgency of urination     Past Surgical History:  Procedure Laterality Date  . [place] STUDY N/A 04/03/2016   Procedure: [place] STUDY;  Surgeon: Manus Gunning, MD;  Location: [facility];  Service: Gastroenterology;  Laterality: N/A;  . BREAST LUMPECTOMY Right 10-11-2012   W/ SLN BX  . CARDIAC CATHETERIZATION  09-13-2007  DR Lia Foyer   WELL-PRESERVED LVF/  DIFFUSE SCATTERED CORONARY CALCIFACATION AND ATHEROSCLEROSIS WITHOUT OBSTRUCTION  . CARDIAC CATHETERIZATION   08-04-2010  DR Angelena Form   NON-OBSTRUCTIVE CAD/  pLAD 40%/  oLAD 30%/  mLAD 30%/  pRCA 30%/  EF 60%  . CARDIOVASCULAR STRESS TEST  06-18-2012  DR McALHANY   LOW RISK NUCLEAR STUDY/  SMALL FIXED AREA OF MODERATELY DECREASED UPTAKE IN ANTEROSEPTAL WALL WHICH MAY BE ARTIFACTUAL/  NO ISCHEMIA/  EF 68%  . COLONOSCOPY  09-29-2010  . CYSTOSCOPY WITH HYDRODISTENSION AND BIOPSY N/A 03/06/2014   Procedure: CYSTOSCOPY/HYDRODISTENSION/ INSTILATION OF MARCAINE AND PYRIDIUM;  Surgeon: Ailene Rud, MD;  Location: [facility];  Service: Urology;  Laterality: N/A;  . ESOPHAGEAL MANOMETRY N/A 04/03/2016   Procedure: ESOPHAGEAL MANOMETRY (EM);  Surgeon: Manus Gunning, MD;  Location: [facility];  Service: Gastroenterology;  Laterality: N/A;  . NASAL SINUS SURGERY  1985  . ORIF RIGHT ANKLE  FX  2006  . REMOVAL VOCAL CORD CYST  FEB 2014  . RIGHT BREAST BX  08-23-2012  . RIGHT HAND SURGERY  X3  LAST ONE 2009   INCLUDES  ORIF RIGHT 5TH FINGER AND REVISION TWICE  . TONSILLECTOMY AND ADENOIDECTOMY  AGE 30  . TOTAL ABDOMINAL HYSTERECTOMY W/ BILATERAL SALPINGOOPHORECTOMY  1982   W/  APPENDECTOMY  . TRANSTHORACIC ECHOCARDIOGRAM  06-24-2012   GRADE I DIASTOLIC DYSFUNCTION/  EF 55-60%/  MILD MR    There were no vitals filed for  this visit.       Subjective Assessment - 03/14/17 1017    Subjective Pt reports she had PT in the past for her neck and back and did well.  The neck pain has returned and the MD thinks it all comes from her neck. She has to sleep on two pillows due to severe GERD however after a couple hours tries to go down to one pillow d/t neck pain. Most recently saw this MD as her pain has gotten more than she can handle over the last year. She had been managing the pain with her medications and she doesn't want to get on any  more medications.    Pertinent History osteoporosis, scoliosis, h/o BPPV, fibromyalgia, h/o breast CA 2013   How long can you sit comfortably? less  65min   How long can you walk comfortably? tolerates about 15 min, walking is " terrible" has vertigo and a balance issue so she uses a cane for balance and to help her knee   Patient Stated Goals wishes to not have to need more pain medications, sleep better at night, be able to do some exercise and have less pain like before   Currently in Pain? Yes  has pain all over    Pain Score 9    Pain Location Neck   Pain Orientation Right;Left  Rt side is worse   Pain Descriptors / Indicators Cramping;Sharp;Jabbing   Pain Type Chronic pain   Pain Radiating Towards into head and ear.    Pain Onset More than a month ago   Pain Frequency Constant   Aggravating Factors  turning her head, bending down/forward,    Pain Relieving Factors muscle rub helps some   Effect of Pain on Daily Activities unable to perform all her daily activities and sleep            [facility] PT Assessment - 03/14/17 0001      Assessment   Medical Diagnosis Musculoskeletal neck pain   Referring Provider Dr Sarina Ill   Onset Date/Surgical Date 03/14/16   Hand Dominance Right   Next MD Visit 04/21/17   Prior Therapy yes about 2 yrs ago     Precautions   Precautions --  light exercise,  no treadmill,    Precaution Comments osteoporosis, h/o BPPV     Balance Screen   Has the patient fallen in the past 6 months No  only close falls   Has the patient had a decrease in activity level because of a fear of falling?  Yes   Is the patient reluctant to leave their home because of a fear of falling?  No     Home Ecologist residence   Living Arrangements Spouse/significant other   Home Layout Two level  uses the cane and does slowly     Prior Function   Level of Independence Independent   Leisure do things with 72 yo granddaughter, gardening ( if she could bend over more)       Cognition   Overall Cognitive Status --  pt reports she is having some memory issues, forgerfulness       Observation/Other Assessments   Focus on Therapeutic Outcomes (FOTO)  63% limited     Posture/Postural Control   Posture/Postural Control Postural limitations   Postural Limitations Rounded Shoulders;Forward head;Increased thoracic kyphosis   Posture Comments demonstrating postural decline as happens with osteoporosis     ROM / Strength   AROM / PROM /  Strength AROM;Strength     AROM   AROM Assessment Site Shoulder;Cervical   Right/Left Shoulder --  bilat WNL    Cervical Flexion 65 with pulling behind head   Cervical Extension 45, pain at base of head   Cervical - Right Side Bend 16 with pain   Cervical - Left Side Bend 29   Cervical - Right Rotation 59   Cervical - Left Rotation 49     Strength   Strength Assessment Site Shoulder;Elbow   Right/Left Shoulder --  bilat WNL except ER 4+/5   Right/Left Elbow --  biceps 5/5, triceps 4/5     Palpation   Palpation comment very tight in cervical and upper shoulder/back.  tightness in SCM, upper traps, rhomboids, teres minor/major, pecs.                    [place] Adult PT Treatment/Exercise - 03/14/17 0001      Self-Care   Self-Care Other Self-Care Comments   Other Self-Care Comments  MEEKs principles of standing tall      Exercises   Exercises Other Exercises   Other Exercises  cervical and scapular retractions     Modalities   Modalities Moist Heat     Moist Heat Therapy   Number Minutes Moist Heat 15 Minutes   Moist Heat Location Cervical  thoracic                PT Education - 03/14/17 1135    Education provided Yes   Education Details HEP   Person(s) Educated Patient   Methods Explanation;Demonstration;Handout   Comprehension Returned demonstration;Verbalized understanding             PT Long Term Goals - 03/14/17 1142      PT LONG TERM GOAL #1   Title independent with HEP (04/25/17)   Time 6   Period Weeks   Status New     PT LONG TERM GOAL #2   Title improve cervical ROM bilat  rotation =/> 60 degrees ( 04/25/17)    Time 6   Period Weeks   Status New     PT LONG TERM GOAL #3   Title report 75% improvement in symtoms (04/25/17)   Time 6   Period Weeks   Status New     PT LONG TERM GOAL #4   Title improve FOTO =/< 53% limited, CK level ( 04/25/17)    Time 6   Period Weeks   Status New     PT LONG TERM GOAL #5   Title demo UE strength =/> 5-/5 ( 04/25/17)    Time 6   Period Weeks   Status New               Plan - 03/14/17 1135    Clinical Impression Statement 71 yo female presents for moderate complexity PT eval due to reoccurrence of neck/back pain. She responded well to treatment a couple years ago and wishes to have that again.  She has stiffness in her neck and multiple areas of tightness in her neck, pecs and shoulder complex. Melissia also has some weakness in her upper back and shoulders.  Her rehab progress will be complicated due to other diagnosis of osteoporosis, fibromyalgia, vertigo and back problems.    Rehab Potential Good   PT Frequency 2x / week   PT Duration 6 weeks   PT Treatment/Interventions Moist Heat;Ultrasound;Therapeutic exercise;Dry needling;Taping;Manual techniques;Neuromuscular re-education;Cryotherapy;Electrical Stimulation;Patient/family education;Passive range of motion   PT Next Visit  Plan manual work, DN to [employer], pecs, posterior shoulder and upper traps as tolerated   Consulted and Agree with Plan of Care Patient      Patient will benefit from skilled therapeutic intervention in order to improve the following deficits and impairments:  Decreased range of motion, Dizziness, Increased muscle spasms, Impaired UE functional use, Pain, Decreased strength  Visit Diagnosis: Cervicalgia - Plan: PT plan of care cert/re-cert  Muscle weakness (generalized) - Plan: PT plan of care cert/re-cert  Other muscle spasm - Plan: PT plan of care cert/re-cert     Problem List Patient Active Problem List   Diagnosis Date Noted  . Chronic  interstitial cystitis 02/01/2017  . Acute pharyngitis 01/02/2017  . Dysphagia   . Esophageal reflux   . Allergic rhinitis 01/25/2016  . Shortness of breath   . Left hip pain 04/30/2015  . Sciatica 04/30/2015  . Knee pain, right 04/30/2015  . Leukocytes in urine 04/30/2015  . Hyperlipidemia 03/01/2015  . Bilateral hand numbness 03/01/2015  . Pulmonary nodules 02/12/2015  . External hemorrhoids 02/05/2015  . Vaginitis and vulvovaginitis 02/05/2015  . Chronic night sweats 01/08/2015  . Atypical chest pain 01/01/2015  . Chest pain 01/01/2015  . Renal insufficiency 07/16/2014  . Sepsis secondary to UTI ([place]) 02/05/2014  . Grief reaction 12/29/2013  . Urinary hesitancy 12/04/2013  . Abdominal pain, other specified site 10/26/2013  . Headache(784.0) 10/13/2013  . Pain, joint, multiple sites 08/18/2013  . Vasculitis ([place]) 07/29/2013  . ANA positive 07/29/2013  . Other and unspecified hyperlipidemia 07/12/2013  . Contusion, chest wall 04/30/2013  . Malignant neoplasm of lower-outer quadrant of female breast ([place]) 01/03/2013  . Breast cancer, right breast ([place]) 10/17/2012  . Splenic lesion 09/02/2012  . Asthma, allergic 08/05/2012  . GERD (gastroesophageal reflux disease) 06/03/2012  . CAD (coronary artery disease) 06/03/2012  . Former heavy tobacco smoker 06/03/2012  . Edema 04/08/2012  . Stricture and stenosis of esophagus 10/19/2011  . Constipation - functional 07/21/2011  . Nonspecific (abnormal) findings on radiological and other examination of biliary tract 07/21/2011  . Acute UTI 06/16/2011  . Osteoporosis 04/17/2011  . POSTMENOPAUSAL STATUS 01/27/2011  . CONSTIPATION 11/02/2010  . FIBROCYSTIC BREAST DISEASE, HX OF 10/18/2010  . CAD, NATIVE VESSEL 08/31/2010  . Essential hypertension 08/25/2010  . CAROTID ARTERY DISEASE 08/25/2010  . TOBACCO ABUSE, HX OF 08/25/2010  . GENERALIZED ANXIETY DISORDER 08/03/2010  . FIBROMYALGIA 01/11/2010  . SKIN CANCER, HX OF 01/11/2010     Jeral Pinch PT  03/14/2017, 11:48 AM  [facility] [place], [place], [address] Phone: [phone]   Fax:  [phone]  Name: Dandrea Medders MRN: [id number] Date of Birth: 05/12/1946

## 2017-03-20 ENCOUNTER — Ambulatory Visit (INDEPENDENT_AMBULATORY_CARE_PROVIDER_SITE_OTHER): Payer: Medicare Other | Admitting: Physical Therapy

## 2017-03-20 ENCOUNTER — Encounter: Payer: Self-pay | Admitting: Physical Therapy

## 2017-03-20 DIAGNOSIS — M62838 Other muscle spasm: Secondary | ICD-10-CM | POA: Diagnosis not present

## 2017-03-20 DIAGNOSIS — M6281 Muscle weakness (generalized): Secondary | ICD-10-CM | POA: Diagnosis not present

## 2017-03-20 DIAGNOSIS — M542 Cervicalgia: Secondary | ICD-10-CM | POA: Diagnosis not present

## 2017-03-20 NOTE — Therapy (Signed)
[place], [place], [address] Phone: [phone]   Fax:  [phone]  Physical Therapy Treatment  Patient Details  Name: [name] MRN: [id number] Date of Birth: [birth year] Referring Provider: Dr Sarina Ill  Encounter Date: 03/20/2017      PT End of Session - 03/20/17 0935    Visit Number 2   Number of Visits 12   Date for PT Re-Evaluation 04/25/17   PT Start Time 0935   PT Stop Time 1036   PT Time Calculation (min) 61 min      Past Medical History:  Diagnosis Date  . Arthritis   . Asthma   . CAD (coronary artery disease) CARDIOLOGIST--  DR Angelena Form   mild non-obstructive cad  . Chronic constipation   . COPD (chronic obstructive pulmonary disease) ([place])   . Fibromyalgia   . Frequency of urination   . GERD (gastroesophageal reflux disease)   . H/O hiatal hernia   . History of basal cell carcinoma excision    X2  . History of breast cancer ONCOLOGIST-- DR Jana Hakim---  NO RECURRANCE   DX 07/2012;  LOW GRADE DCIS  ER+PR+  ----  S/P RIGHT LUMPECTOMY WITH NEGATIVE MARGINS/   RADIATION ENDED 11/2012  . History of chronic bronchitis   . History of colonic polyps   . History of tachycardia    CONTROLLED  WITH ATENOLOL  . Hypertension   . Osteoporosis   . Pelvic pain   . S/P radiation therapy 11/12/12 - 12/05/12   right Breast  . Sinus headache   . Urgency of urination     Past Surgical History:  Procedure Laterality Date  . [place] STUDY N/A 04/03/2016   Procedure: [place] STUDY;  Surgeon: Manus Gunning, MD;  Location: [facility];  Service: Gastroenterology;  Laterality: N/A;  . BREAST LUMPECTOMY Right 10-11-2012   W/ SLN BX  . CARDIAC CATHETERIZATION  09-13-2007  DR Lia Foyer   WELL-PRESERVED LVF/  DIFFUSE SCATTERED CORONARY CALCIFACATION AND ATHEROSCLEROSIS WITHOUT OBSTRUCTION  . CARDIAC CATHETERIZATION  08-04-2010  DR Angelena Form   NON-OBSTRUCTIVE CAD/  pLAD 40%/   oLAD 30%/  mLAD 30%/  pRCA 30%/  EF 60%  . CARDIOVASCULAR STRESS TEST  06-18-2012  DR McALHANY   LOW RISK NUCLEAR STUDY/  SMALL FIXED AREA OF MODERATELY DECREASED UPTAKE IN ANTEROSEPTAL WALL WHICH MAY BE ARTIFACTUAL/  NO ISCHEMIA/  EF 68%  . COLONOSCOPY  09-29-2010  . CYSTOSCOPY WITH HYDRODISTENSION AND BIOPSY N/A 03/06/2014   Procedure: CYSTOSCOPY/HYDRODISTENSION/ INSTILATION OF MARCAINE AND PYRIDIUM;  Surgeon: Ailene Rud, MD;  Location: [facility];  Service: Urology;  Laterality: N/A;  . ESOPHAGEAL MANOMETRY N/A 04/03/2016   Procedure: ESOPHAGEAL MANOMETRY (EM);  Surgeon: Manus Gunning, MD;  Location: [facility];  Service: Gastroenterology;  Laterality: N/A;  . NASAL SINUS SURGERY  1985  . ORIF RIGHT ANKLE  FX  2006  . REMOVAL VOCAL CORD CYST  FEB 2014  . RIGHT BREAST BX  08-23-2012  . RIGHT HAND SURGERY  X3  LAST ONE 2009   INCLUDES  ORIF RIGHT 5TH FINGER AND REVISION TWICE  . TONSILLECTOMY AND ADENOIDECTOMY  AGE 35  . TOTAL ABDOMINAL HYSTERECTOMY W/ BILATERAL SALPINGOOPHORECTOMY  1982   W/  APPENDECTOMY  . TRANSTHORACIC ECHOCARDIOGRAM  06-24-2012   GRADE I DIASTOLIC DYSFUNCTION/  EF 55-60%/  MILD MR    There were no vitals filed for this visit.      Subjective  Assessment - 03/20/17 0939    Subjective Pt reports Rt ear pain, headache and back of the neck pain today and over the weekend.  She has been practicing her standing tall and feels more stable from this.    Patient Stated Goals wishes to not have to need more pain medications, sleep better at night, be able to do some exercise and have less pain like before   Currently in Pain? 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       Exercises   Exercises Neck     Neck Exercises: Machines for Strengthening   UBE (Upper Arm Bike) L1x 4' alt FWD/BWD     Neck Exercises: Supine   Other Supine Exercise on foam bolster, 10 reps, red band, overhead pull, horizontal abduction  VC for form     Modalities   Modalities Electrical Stimulation;Moist Heat     Moist Heat Therapy   Number Minutes Moist Heat 20 Minutes   Moist Heat Location Cervical     Electrical Stimulation   Electrical Stimulation Location cervcial   Electrical Stimulation Action IFC   Electrical Stimulation Parameters to tolerance   Electrical Stimulation Goals Tone;Pain          Trigger Point Dry Needling - 03/20/17 0954    Consent Given? Yes   Education Handout Provided Yes   Muscles Treated Upper Body Suboccipitals muscle group      SubOccipitals Response Twitch response elicited;Palpable increased muscle length              PT Education - 03/20/17 1302    Education provided Yes   Education Details reviewed HEP notebook with pt   Person(s) Educated Patient   Methods Explanation;Handout   Comprehension Verbalized understanding             PT Long Term Goals - 03/20/17 1304      PT LONG TERM GOAL #1   Title independent with HEP (04/25/17)   Status On-going     PT LONG TERM GOAL #2   Title improve cervical ROM bilat rotation =/> 60 degrees ( 04/25/17)    Status On-going     PT LONG TERM GOAL #3   Title report 75% improvement in symtoms (04/25/17)   Status On-going     PT LONG TERM GOAL #4   Title improve FOTO =/< 53% limited, CK level ( 04/25/17)    Status On-going     PT LONG TERM GOAL #5   Title demo UE strength =/> 5-/5 ( 04/25/17)    Status On-going                Plan - 03/20/17 1303    Clinical Impression Statement [name] reports she is feeling better after her first treatment.  Tolerated DN well with manual therapy.  This is her second visit, no goals met. Sheis very tight in the cervical area.    Rehab Potential Good   PT Frequency 2x / week   PT Duration 6 weeks   PT Treatment/Interventions Moist Heat;Ultrasound;Therapeutic exercise;Dry needling;Taping;Manual techniques;Neuromuscular re-education;Cryotherapy;Electrical Stimulation;Patient/family education;Passive range of motion   PT Next Visit Plan assess response to DN, cont with manual and DN PRN    Consulted and Agree with Plan of Care Patient      Patient will benefit from skilled therapeutic intervention in order to improve the following deficits and impairments:  Decreased range of motion, Dizziness, Increased muscle spasms, Impaired UE functional use, Pain, Decreased strength  Visit Diagnosis: Cervicalgia  Muscle weakness (generalized)  Other muscle spasm     Problem List Patient Active Problem List   Diagnosis Date Noted  . Chronic interstitial cystitis 02/01/2017  . Acute pharyngitis 01/02/2017  . Dysphagia   . Esophageal reflux   . Allergic rhinitis 01/25/2016  . Shortness of breath   . Left hip pain 04/30/2015  . Sciatica 04/30/2015  . Knee pain, right 04/30/2015  . Leukocytes in urine 04/30/2015  . Hyperlipidemia 03/01/2015  . Bilateral hand numbness 03/01/2015  . Pulmonary nodules 02/12/2015  . External hemorrhoids 02/05/2015  . Vaginitis and vulvovaginitis 02/05/2015  . Chronic night sweats 01/08/2015  . Atypical chest pain 01/01/2015  . Chest pain 01/01/2015  . Renal insufficiency 07/16/2014  . Sepsis secondary to UTI ([place]) 02/05/2014  . Grief reaction 12/29/2013  . Urinary hesitancy 12/04/2013  . Abdominal pain, other specified site 10/26/2013  . Headache(784.0) 10/13/2013  . Pain, joint, multiple sites 08/18/2013  . Vasculitis ([place])  07/29/2013  . ANA positive 07/29/2013  . Other and unspecified hyperlipidemia 07/12/2013  . Contusion, chest wall 04/30/2013  . Malignant neoplasm of lower-outer quadrant of female breast ([place]) 01/03/2013  . Breast cancer, right breast ([place]) 10/17/2012  . Splenic lesion 09/02/2012  . Asthma, allergic 08/05/2012  . GERD (gastroesophageal reflux disease) 06/03/2012  . CAD (coronary artery disease) 06/03/2012  . Former heavy tobacco smoker 06/03/2012  . Edema 04/08/2012  . Stricture and stenosis of esophagus 10/19/2011  . Constipation - functional 07/21/2011  . Nonspecific (abnormal) findings on radiological and other examination of biliary tract 07/21/2011  . Acute UTI 06/16/2011  . Osteoporosis 04/17/2011  . POSTMENOPAUSAL STATUS 01/27/2011  . CONSTIPATION 11/02/2010  . FIBROCYSTIC BREAST DISEASE, HX OF 10/18/2010  . CAD, NATIVE VESSEL 08/31/2010  . Essential hypertension 08/25/2010  . CAROTID ARTERY DISEASE 08/25/2010  . TOBACCO ABUSE, HX OF 08/25/2010  . GENERALIZED ANXIETY DISORDER 08/03/2010  . FIBROMYALGIA 01/11/2010  . SKIN CANCER, HX OF 01/11/2010    Jeral Pinch PT  03/20/2017, 1:06 PM  [facility] [place], [place], [address] Phone: [phone]   Fax:  [phone]  Name: [name] MRN: [id number] Date of Birth: [birth year]

## 2017-03-22 ENCOUNTER — Ambulatory Visit (INDEPENDENT_AMBULATORY_CARE_PROVIDER_SITE_OTHER): Payer: Medicare Other | Admitting: Physical Therapy

## 2017-03-22 DIAGNOSIS — M6281 Muscle weakness (generalized): Secondary | ICD-10-CM | POA: Diagnosis not present

## 2017-03-22 DIAGNOSIS — M542 Cervicalgia: Secondary | ICD-10-CM | POA: Diagnosis not present

## 2017-03-22 DIAGNOSIS — M62838 Other muscle spasm: Secondary | ICD-10-CM

## 2017-03-22 NOTE — Patient Instructions (Addendum)
Side Pull: Double Arm    On back, knees bent, feet flat. Arms perpendicular to body, shoulder level, elbows straight but relaxed. Pull arms out to sides, elbows straight. Resistance band comes across collarbones, hands toward floor. Hold momentarily. Slowly return to starting position. Repeat _5x5 reps while holding the bridge__ times. Band color __red__. Perform every other day.  Add in reps as you get stronger  Bridging    Hold hips up while performing the above exercise. Feet and knees are hip distance apart, pushing up through the heels. Keep belly tight.   Over Head Pull: Narrow Grip    On back, knees bent, feet flat, band across thighs, elbows straight but relaxed. Pull hands apart (start). Keeping elbows straight, bring arms up and over head, hands toward floor. Keep pull steady on band. Hold momentarily. Return slowly, keeping pull steady, back to start. Repeat _5x5 reps__ times. Band color ___red__.  Increase reps as they get easy.   Copyright  VHI. All rights reserved.

## 2017-03-22 NOTE — Therapy (Signed)
[place], [place], [address] Phone: [phone]   Fax:  [phone]  Physical Therapy Treatment  Patient Details  Name: [name] MRN: [id number] Date of Birth: Jul 24, 1946 Referring Provider: Dr Sarina Ill  Encounter Date: 03/22/2017      PT End of Session - 03/22/17 1621    Visit Number 3   Number of Visits 12   Date for PT Re-Evaluation 04/25/17   PT Start Time 5427   PT Stop Time 1722   PT Time Calculation (min) 61 min      Past Medical History:  Diagnosis Date  . Arthritis   . Asthma   . CAD (coronary artery disease) CARDIOLOGIST--  DR Angelena Form   mild non-obstructive cad  . Chronic constipation   . COPD (chronic obstructive pulmonary disease) ([place])   . Fibromyalgia   . Frequency of urination   . GERD (gastroesophageal reflux disease)   . H/O hiatal hernia   . History of basal cell carcinoma excision    X2  . History of breast cancer ONCOLOGIST-- DR Jana Hakim---  NO RECURRANCE   DX 07/2012;  LOW GRADE DCIS  ER+PR+  ----  S/P RIGHT LUMPECTOMY WITH NEGATIVE MARGINS/   RADIATION ENDED 11/2012  . History of chronic bronchitis   . History of colonic polyps   . History of tachycardia    CONTROLLED  WITH ATENOLOL  . Hypertension   . Osteoporosis   . Pelvic pain   . S/P radiation therapy 11/12/12 - 12/05/12   right Breast  . Sinus headache   . Urgency of urination     Past Surgical History:  Procedure Laterality Date  . [place] STUDY N/A 04/03/2016   Procedure: [place] STUDY;  Surgeon: Manus Gunning, MD;  Location: [facility];  Service: Gastroenterology;  Laterality: N/A;  . BREAST LUMPECTOMY Right 10-11-2012   W/ SLN BX  . CARDIAC CATHETERIZATION  09-13-2007  DR Lia Foyer   WELL-PRESERVED LVF/  DIFFUSE SCATTERED CORONARY CALCIFACATION AND ATHEROSCLEROSIS WITHOUT OBSTRUCTION  . CARDIAC CATHETERIZATION  08-04-2010  DR Angelena Form   NON-OBSTRUCTIVE CAD/  pLAD 40%/   oLAD 30%/  mLAD 30%/  pRCA 30%/  EF 60%  . CARDIOVASCULAR STRESS TEST  06-18-2012  DR McALHANY   LOW RISK NUCLEAR STUDY/  SMALL FIXED AREA OF MODERATELY DECREASED UPTAKE IN ANTEROSEPTAL WALL WHICH MAY BE ARTIFACTUAL/  NO ISCHEMIA/  EF 68%  . COLONOSCOPY  09-29-2010  . CYSTOSCOPY WITH HYDRODISTENSION AND BIOPSY N/A 03/06/2014   Procedure: CYSTOSCOPY/HYDRODISTENSION/ INSTILATION OF MARCAINE AND PYRIDIUM;  Surgeon: Ailene Rud, MD;  [facility]: [facility];  Service: Urology;  Laterality: N/A;  . ESOPHAGEAL MANOMETRY N/A 04/03/2016   Procedure: ESOPHAGEAL MANOMETRY (EM);  Surgeon: Manus Gunning, MD;  Location: [facility];  Service: Gastroenterology;  Laterality: N/A;  . NASAL SINUS SURGERY  1985  . ORIF RIGHT ANKLE  FX  2006  . REMOVAL VOCAL CORD CYST  FEB 2014  . RIGHT BREAST BX  08-23-2012  . RIGHT HAND SURGERY  X3  LAST ONE 2009   INCLUDES  ORIF RIGHT 5TH FINGER AND REVISION TWICE  . TONSILLECTOMY AND ADENOIDECTOMY  AGE 61  . TOTAL ABDOMINAL HYSTERECTOMY W/ BILATERAL SALPINGOOPHORECTOMY  1982   W/  APPENDECTOMY  . TRANSTHORACIC ECHOCARDIOGRAM  06-24-2012   GRADE I DIASTOLIC DYSFUNCTION/  EF 55-60%/  MILD MR    There were no vitals filed for this visit.      Subjective  Assessment - 03/22/17 1624    Subjective [name] reports she is feeling better after the last treamtent .  She was able to do some work in the yard.  She has had some back pain after however felt better today. She is also nowable to flex her toes on the Rt side and she hasn't been able to do that in a very long time.    Currently in Pain? No/denies                         [facility] Adult PT Treatment/Exercise - 03/22/17 0001      Exercises   Exercises Neck     Neck Exercises: Machines for Strengthening   UBE (Upper Arm Bike) L2x 4' alt FWD/BWD     Neck Exercises: Supine   Other Supine Exercise 5x5 reps horizontal abduct with red band while holding bridge     Modalities    Modalities Electrical Stimulation;Moist Heat     Moist Heat Therapy   Number Minutes Moist Heat 15 Minutes   Moist Heat Location Cervical     Electrical Stimulation   Electrical Stimulation Location cervical   Electrical Stimulation Action IFC   Electrical Stimulation Parameters to tolerance   Electrical Stimulation Goals Tone;Pain     Manual Therapy   Manual Therapy Soft tissue mobilization   Soft tissue mobilization bilat upper traps and periocciput          Trigger Point Dry Needling - 03/22/17 1703    Consent Given? Yes   Education Handout Provided No   Muscles Treated Upper Body Upper trapezius;Levator scapulae   Upper Trapezius Response Palpable increased muscle length;Twitch reponse elicited   Levator Scapulae Response Palpable increased muscle length;Twitch response elicited                   PT Long Term Goals - 03/20/17 1304      PT LONG TERM GOAL #1   Title independent with HEP (04/25/17)   Status On-going     PT LONG TERM GOAL #2   Title improve cervical ROM bilat rotation =/> 60 degrees ( 04/25/17)    Status On-going     PT LONG TERM GOAL #3   Title report 75% improvement in symtoms (04/25/17)   Status On-going     PT LONG TERM GOAL #4   Title improve FOTO =/< 53% limited, CK level ( 04/25/17)    Status On-going     PT LONG TERM GOAL #5   Title demo UE strength =/> 5-/5 ( 04/25/17)    Status On-going               Plan - 03/22/17 1758    Clinical Impression Statement [name] had a great response to her last treatment.  She is very excited about her progress. She is still very tight in her neck and upper shoulders and has postural dysfunction and weakness that need to be addressed.    Rehab Potential Good   PT Frequency 2x / week   PT Duration 6 weeks   PT Treatment/Interventions Moist Heat;Ultrasound;Therapeutic exercise;Dry needling;Taping;Manual techniques;Neuromuscular re-education;Cryotherapy;Electrical Stimulation;Patient/family  education;Passive range of motion   PT Next Visit Plan assess response to DN, cont with manual and DN PRN    Consulted and Agree with Plan of Care Patient      Patient will benefit from skilled therapeutic intervention in order to improve the following deficits and impairments:  Decreased range of motion, Dizziness, Increased  muscle spasms, Impaired UE functional use, Pain, Decreased strength  Visit Diagnosis: Cervicalgia  Muscle weakness (generalized)  Other muscle spasm     Problem List Patient Active Problem List   Diagnosis Date Noted  . Chronic interstitial cystitis 02/01/2017  . Acute pharyngitis 01/02/2017  . Dysphagia   . Esophageal reflux   . Allergic rhinitis 01/25/2016  . Shortness of breath   . Left hip pain 04/30/2015  . Sciatica 04/30/2015  . Knee pain, right 04/30/2015  . Leukocytes in urine 04/30/2015  . Hyperlipidemia 03/01/2015  . Bilateral hand numbness 03/01/2015  . Pulmonary nodules 02/12/2015  . External hemorrhoids 02/05/2015  . Vaginitis and vulvovaginitis 02/05/2015  . Chronic night sweats 01/08/2015  . Atypical chest pain 01/01/2015  . Chest pain 01/01/2015  . Renal insufficiency 07/16/2014  . Sepsis secondary to UTI ([place]) 02/05/2014  . Grief reaction 12/29/2013  . Urinary hesitancy 12/04/2013  . Abdominal pain, other specified site 10/26/2013  . Headache(784.0) 10/13/2013  . Pain, joint, multiple sites 08/18/2013  . Vasculitis ([place]) 07/29/2013  . ANA positive 07/29/2013  . Other and unspecified hyperlipidemia 07/12/2013  . Contusion, chest wall 04/30/2013  . Malignant neoplasm of lower-outer quadrant of female breast ([place]) 01/03/2013  . Breast cancer, right breast ([place]) 10/17/2012  . Splenic lesion 09/02/2012  . Asthma, allergic 08/05/2012  . GERD (gastroesophageal reflux disease) 06/03/2012  . CAD (coronary artery disease) 06/03/2012  . Former heavy tobacco smoker 06/03/2012  . Edema 04/08/2012  . Stricture and stenosis of  esophagus 10/19/2011  . Constipation - functional 07/21/2011  . Nonspecific (abnormal) findings on radiological and other examination of biliary tract 07/21/2011  . Acute UTI 06/16/2011  . Osteoporosis 04/17/2011  . POSTMENOPAUSAL STATUS 01/27/2011  . CONSTIPATION 11/02/2010  . FIBROCYSTIC BREAST DISEASE, HX OF 10/18/2010  . CAD, NATIVE VESSEL 08/31/2010  . Essential hypertension 08/25/2010  . CAROTID ARTERY DISEASE 08/25/2010  . TOBACCO ABUSE, HX OF 08/25/2010  . GENERALIZED ANXIETY DISORDER 08/03/2010  . FIBROMYALGIA 01/11/2010  . SKIN CANCER, HX OF 01/11/2010    Jeral Pinch PT  03/22/2017, 6:00 PM  [facility] [place], [place], [address] Phone: [phone]   Fax:  [phone]  Name: [name] MRN: [id number] Date of Birth: 09-24-1946

## 2017-03-27 ENCOUNTER — Ambulatory Visit (INDEPENDENT_AMBULATORY_CARE_PROVIDER_SITE_OTHER): Payer: Medicare Other | Admitting: Physical Therapy

## 2017-03-27 DIAGNOSIS — M6281 Muscle weakness (generalized): Secondary | ICD-10-CM

## 2017-03-27 DIAGNOSIS — M62838 Other muscle spasm: Secondary | ICD-10-CM | POA: Diagnosis not present

## 2017-03-27 DIAGNOSIS — M542 Cervicalgia: Secondary | ICD-10-CM

## 2017-03-27 NOTE — Therapy (Signed)
[place], [place], [address] Phone: [phone]   Fax:  [phone]  Physical Therapy Treatment  Patient Details  Name: [name] MRN: [id number] Date of Birth: 1946/09/20 Referring Provider: Dr Jaynee Eagles  Encounter Date: 03/27/2017      PT End of Session - 03/27/17 1105    Visit Number 4   Number of Visits 12   Date for PT Re-Evaluation 04/25/17   PT Start Time 1105   PT Stop Time 1219   PT Time Calculation (min) 74 min   Activity Tolerance Patient tolerated treatment well      Past Medical History:  Diagnosis Date  . Arthritis   . Asthma   . CAD (coronary artery disease) CARDIOLOGIST--  DR Angelena Form   mild non-obstructive cad  . Chronic constipation   . COPD (chronic obstructive pulmonary disease) ([place])   . Fibromyalgia   . Frequency of urination   . GERD (gastroesophageal reflux disease)   . H/O hiatal hernia   . History of basal cell carcinoma excision    X2  . History of breast cancer ONCOLOGIST-- DR Jana Hakim---  NO RECURRANCE   DX 07/2012;  LOW GRADE DCIS  ER+PR+  ----  S/P RIGHT LUMPECTOMY WITH NEGATIVE MARGINS/   RADIATION ENDED 11/2012  . History of chronic bronchitis   . History of colonic polyps   . History of tachycardia    CONTROLLED  WITH ATENOLOL  . Hypertension   . Osteoporosis   . Pelvic pain   . S/P radiation therapy 11/12/12 - 12/05/12   right Breast  . Sinus headache   . Urgency of urination     Past Surgical History:  Procedure Laterality Date  . [place] STUDY N/A 04/03/2016   Procedure: [place] STUDY;  Surgeon: Manus Gunning, MD;  [facility]: [facility];  Service: Gastroenterology;  Laterality: N/A;  . BREAST LUMPECTOMY Right 10-11-2012   W/ SLN BX  . CARDIAC CATHETERIZATION  09-13-2007  DR Lia Foyer   WELL-PRESERVED LVF/  DIFFUSE SCATTERED CORONARY CALCIFACATION AND ATHEROSCLEROSIS WITHOUT OBSTRUCTION  . CARDIAC CATHETERIZATION  08-04-2010  DR  Angelena Form   NON-OBSTRUCTIVE CAD/  pLAD 40%/  oLAD 30%/  mLAD 30%/  pRCA 30%/  EF 60%  . CARDIOVASCULAR STRESS TEST  06-18-2012  DR McALHANY   LOW RISK NUCLEAR STUDY/  SMALL FIXED AREA OF MODERATELY DECREASED UPTAKE IN ANTEROSEPTAL WALL WHICH MAY BE ARTIFACTUAL/  NO ISCHEMIA/  EF 68%  . COLONOSCOPY  09-29-2010  . CYSTOSCOPY WITH HYDRODISTENSION AND BIOPSY N/A 03/06/2014   Procedure: CYSTOSCOPY/HYDRODISTENSION/ INSTILATION OF MARCAINE AND PYRIDIUM;  Surgeon: Ailene Rud, MD;  Location: [facility];  Service: Urology;  Laterality: N/A;  . ESOPHAGEAL MANOMETRY N/A 04/03/2016   Procedure: ESOPHAGEAL MANOMETRY (EM);  Surgeon: Manus Gunning, MD;  [facility];  Service: Gastroenterology;  Laterality: N/A;  . NASAL SINUS SURGERY  1985  . ORIF RIGHT ANKLE  FX  2006  . REMOVAL VOCAL CORD CYST  FEB 2014  . RIGHT BREAST BX  08-23-2012  . RIGHT HAND SURGERY  X3  LAST ONE 2009   INCLUDES  ORIF RIGHT 5TH FINGER AND REVISION TWICE  . TONSILLECTOMY AND ADENOIDECTOMY  AGE 9  . TOTAL ABDOMINAL HYSTERECTOMY W/ BILATERAL SALPINGOOPHORECTOMY  1982   W/  APPENDECTOMY  . TRANSTHORACIC ECHOCARDIOGRAM  06-24-2012   GRADE I DIASTOLIC DYSFUNCTION/  EF 55-60%/  MILD MR    There were no vitals filed for this  visit.      Subjective Assessment - 03/27/17 1106    Subjective [name] reports she can't believe she is walking in this AM, her low back was so sore after working in the yard on Sat. Used heat and stretches to settle the pain down.  Neck is doing well, some soreness between the shoulder blades.    Patient Stated Goals wishes to not have to need more pain medications, sleep better at night, be able to do some exercise and have less pain like before   Currently in Pain? No/denies            [facility] PT Assessment - 03/27/17 0001      Assessment   Medical Diagnosis Musculoskeletal neck pain   Referring Provider Dr Jaynee Eagles   Next MD Visit 04/21/17     AROM   Cervical  Flexion 65   Cervical Extension 60   Cervical - Right Rotation 67   Cervical - Left Rotation 60     Strength   Right/Left Shoulder --  bilat WNL except Lt ER 4+/5   Right/Left Elbow --  bilat WNL                      [facility] Adult PT Treatment/Exercise - 03/27/17 0001      Self-Care   Self-Care ADL's   ADL's reviewed safe ways to perform yard work and Patent attorney Neck     Neck Exercises: Machines for Strengthening   UBE (Upper Arm Bike) L2x 4' alt FWD/BWD     Neck Exercises: Supine   Other Supine Exercise horizontal abduction in bridge with green band.      Neck Exercises: Prone   Other Prone Exercise 2x10 single leg lifts, single arm lifts all 4 extremities   Other Prone Exercise 30 reps T's      Modalities   Modalities Electrical Stimulation;Moist Heat     Moist Heat Therapy   Number Minutes Moist Heat 20 Minutes   Moist Heat Location Cervical;Lumbar Spine  thoracic     Electrical Stimulation   Electrical Stimulation Location thoracic, lumbar   Electrical Stimulation Action IFC   Electrical Stimulation Parameters  to tolerance   Electrical Stimulation Goals Tone;Pain     Neck Exercises: Stretches   Other Neck Stretches single knee to chest stretches   Other Neck Stretches stretching while lying over small noodle vertical to body.                PT Education - 03/27/17 1138    Education provided Yes   Education Details progress HEP to green band, ADLs for gardening   Person(s) Educated Patient   Methods Explanation   Comprehension Verbalized understanding             PT Long Term Goals - 03/27/17 1117      PT LONG TERM GOAL #1   Title independent with HEP (04/25/17)   Status On-going     PT LONG TERM GOAL #2   Title improve cervical ROM bilat rotation =/> 60 degrees ( 04/25/17)    Status Achieved     PT LONG TERM GOAL #3   Title report 75% improvement in symtoms (04/25/17)   Status On-going  60%  improved     PT LONG TERM GOAL #4   Title improve FOTO =/< 53% limited, CK level ( 04/25/17)    Status On-going     PT LONG TERM GOAL #  5   Title demo UE strength =/> 5-/5 ( 04/25/17)    Status Partially Met  all but Lt shoulder ER                Plan - 03/27/17 1202    Clinical Impression Statement Kaydee has met some of her goals and making great progress to the others.  Cervical ROM and upper body strength is improving.  Her pain has moved around some and is more thoracic and lumbar.  These will  be addressed with ther ex, and education on body mechanics.    Rehab Potential Good   PT Frequency 2x / week   PT Duration 6 weeks   PT Treatment/Interventions Moist Heat;Ultrasound;Therapeutic exercise;Dry needling;Taping;Manual techniques;Neuromuscular re-education;Cryotherapy;Electrical Stimulation;Patient/family education;Passive range of motion   PT Next Visit Plan follow up on Arts development officer education    Consulted and Agree with Plan of Care Patient      Patient will benefit from skilled therapeutic intervention in order to improve the following deficits and impairments:  Decreased range of motion, Dizziness, Increased muscle spasms, Impaired UE functional use, Pain, Decreased strength  Visit Diagnosis: Cervicalgia  Muscle weakness (generalized)  Other muscle spasm     Problem List Patient Active Problem List   Diagnosis Date Noted  . Chronic interstitial cystitis 02/01/2017  . Acute pharyngitis 01/02/2017  . Dysphagia   . Esophageal reflux   . Allergic rhinitis 01/25/2016  . Shortness of breath   . Left hip pain 04/30/2015  . Sciatica 04/30/2015  . Knee pain, right 04/30/2015  . Leukocytes in urine 04/30/2015  . Hyperlipidemia 03/01/2015  . Bilateral hand numbness 03/01/2015  . Pulmonary nodules 02/12/2015  . External hemorrhoids 02/05/2015  . Vaginitis and vulvovaginitis 02/05/2015  . Chronic night sweats 01/08/2015  . Atypical chest pain 01/01/2015  . Chest  pain 01/01/2015  . Renal insufficiency 07/16/2014  . Sepsis secondary to UTI ([place]) 02/05/2014  . Grief reaction 12/29/2013  . Urinary hesitancy 12/04/2013  . Abdominal pain, other specified site 10/26/2013  . Headache(784.0) 10/13/2013  . Pain, joint, multiple sites 08/18/2013  . Vasculitis ([place]) 07/29/2013  . ANA positive 07/29/2013  . Other and unspecified hyperlipidemia 07/12/2013  . Contusion, chest wall 04/30/2013  . Malignant neoplasm of lower-outer quadrant of female breast ([place]) 01/03/2013  . Breast cancer, right breast ([place]) 10/17/2012  . Splenic lesion 09/02/2012  . Asthma, allergic 08/05/2012  . GERD (gastroesophageal reflux disease) 06/03/2012  . CAD (coronary artery disease) 06/03/2012  . Former heavy tobacco smoker 06/03/2012  . Edema 04/08/2012  . Stricture and stenosis of esophagus 10/19/2011  . Constipation - functional 07/21/2011  . Nonspecific (abnormal) findings on radiological and other examination of biliary tract 07/21/2011  . Acute UTI 06/16/2011  . Osteoporosis 04/17/2011  . POSTMENOPAUSAL STATUS 01/27/2011  . CONSTIPATION 11/02/2010  . FIBROCYSTIC BREAST DISEASE, HX OF 10/18/2010  . CAD, NATIVE VESSEL 08/31/2010  . Essential hypertension 08/25/2010  . CAROTID ARTERY DISEASE 08/25/2010  . TOBACCO ABUSE, HX OF 08/25/2010  . GENERALIZED ANXIETY DISORDER 08/03/2010  . FIBROMYALGIA 01/11/2010  . SKIN CANCER, HX OF 01/11/2010    Jeral Pinch PT 03/27/2017, 12:07 PM  [facility] [place], [place], [address] Phone: [phone]   Fax:  [phone]  Name: Brelynn Wheller MRN: [id number] Date of Birth: 1946-12-18

## 2017-03-27 NOTE — Patient Instructions (Addendum)
Arm / Leg Lift: Opposite (Prone) - DO ONE LIMB AT A TIME< once that is easy lift arm and leg at the same time.     Lift right leg and opposite arm, keeping knee locked.  Start with single limb lifts, progress to opposite lift together.   Repeat __10__ times per set. Do __2-3__ sets per session. Do __every other__ day. ( Do on bridge day )    Gardening - Weeding / Engineer, maintenance or kneel. Knee pads may be helpful. Gardening - Shoveling    Push from end of handle, shifting body weight from back leg to front leg. Keep knees bent and avoid twisting your back.   Gardening - Digging    Insert tool vertically into soil and step on blade, then lift out small amounts. Copyright  [employer]. All rights reserved.

## 2017-03-29 ENCOUNTER — Encounter: Payer: Medicare Other | Admitting: Physical Therapy

## 2017-03-30 ENCOUNTER — Encounter: Payer: Medicare Other | Admitting: Rehabilitative and Restorative Service Providers"

## 2017-03-30 DIAGNOSIS — M791 Myalgia: Secondary | ICD-10-CM | POA: Diagnosis not present

## 2017-03-30 DIAGNOSIS — E782 Mixed hyperlipidemia: Secondary | ICD-10-CM | POA: Diagnosis not present

## 2017-03-30 DIAGNOSIS — M1990 Unspecified osteoarthritis, unspecified site: Secondary | ICD-10-CM | POA: Diagnosis not present

## 2017-03-30 DIAGNOSIS — E876 Hypokalemia: Secondary | ICD-10-CM | POA: Diagnosis not present

## 2017-04-01 DIAGNOSIS — M25531 Pain in right wrist: Secondary | ICD-10-CM | POA: Diagnosis not present

## 2017-04-01 DIAGNOSIS — R40241 Glasgow coma scale score 13-15, unspecified time: Secondary | ICD-10-CM | POA: Diagnosis not present

## 2017-04-01 DIAGNOSIS — M797 Fibromyalgia: Secondary | ICD-10-CM | POA: Diagnosis not present

## 2017-04-01 DIAGNOSIS — Z87891 Personal history of nicotine dependence: Secondary | ICD-10-CM | POA: Diagnosis not present

## 2017-04-01 DIAGNOSIS — R51 Headache: Secondary | ICD-10-CM | POA: Diagnosis not present

## 2017-04-01 DIAGNOSIS — Z043 Encounter for examination and observation following other accident: Secondary | ICD-10-CM | POA: Diagnosis not present

## 2017-04-01 DIAGNOSIS — S0990XA Unspecified injury of head, initial encounter: Secondary | ICD-10-CM | POA: Diagnosis not present

## 2017-04-01 DIAGNOSIS — M542 Cervicalgia: Secondary | ICD-10-CM | POA: Diagnosis not present

## 2017-04-01 DIAGNOSIS — Z79899 Other long term (current) drug therapy: Secondary | ICD-10-CM | POA: Diagnosis not present

## 2017-04-01 DIAGNOSIS — S7001XA Contusion of right hip, initial encounter: Secondary | ICD-10-CM | POA: Diagnosis not present

## 2017-04-01 DIAGNOSIS — I10 Essential (primary) hypertension: Secondary | ICD-10-CM | POA: Diagnosis not present

## 2017-04-01 DIAGNOSIS — S60211A Contusion of right wrist, initial encounter: Secondary | ICD-10-CM | POA: Diagnosis not present

## 2017-04-01 DIAGNOSIS — J45909 Unspecified asthma, uncomplicated: Secondary | ICD-10-CM | POA: Diagnosis not present

## 2017-04-01 DIAGNOSIS — S40011A Contusion of right shoulder, initial encounter: Secondary | ICD-10-CM | POA: Diagnosis not present

## 2017-04-01 DIAGNOSIS — Z7951 Long term (current) use of inhaled steroids: Secondary | ICD-10-CM | POA: Diagnosis not present

## 2017-04-01 DIAGNOSIS — R05 Cough: Secondary | ICD-10-CM | POA: Diagnosis not present

## 2017-04-01 DIAGNOSIS — M50322 Other cervical disc degeneration at C5-C6 level: Secondary | ICD-10-CM | POA: Diagnosis not present

## 2017-04-02 ENCOUNTER — Encounter: Payer: Self-pay | Admitting: Neurology

## 2017-04-02 ENCOUNTER — Ambulatory Visit (INDEPENDENT_AMBULATORY_CARE_PROVIDER_SITE_OTHER): Payer: Medicare Other | Admitting: Neurology

## 2017-04-02 VITALS — BP 112/62 | HR 74 | Resp 14 | Ht 63.5 in | Wt 115.0 lb

## 2017-04-02 DIAGNOSIS — J449 Chronic obstructive pulmonary disease, unspecified: Secondary | ICD-10-CM | POA: Diagnosis not present

## 2017-04-02 DIAGNOSIS — G4719 Other hypersomnia: Secondary | ICD-10-CM | POA: Diagnosis not present

## 2017-04-02 DIAGNOSIS — R5383 Other fatigue: Secondary | ICD-10-CM | POA: Insufficient documentation

## 2017-04-02 DIAGNOSIS — G4733 Obstructive sleep apnea (adult) (pediatric): Secondary | ICD-10-CM | POA: Diagnosis not present

## 2017-04-02 DIAGNOSIS — R0683 Snoring: Secondary | ICD-10-CM

## 2017-04-02 DIAGNOSIS — J129 Viral pneumonia, unspecified: Secondary | ICD-10-CM | POA: Diagnosis not present

## 2017-04-02 NOTE — Patient Instructions (Signed)
Hypersomnia Hypersomnia is when you feel extremely tired during the day even though you're getting plenty of sleep at night. You may need to take naps during the day, and you may also be extremely difficult to wake up when you are sleeping. What are the causes? The cause of your hypersomnia may not be known. Hypersomnia may be caused by:  Medicines.  Sleep disorders, such as narcolepsy.  Trauma or injury to your head or nervous system.  Using drugs or alcohol.  Tumors.  Medical conditions, such as depression or hypothyroidism.  Genetics. What are the signs or symptoms? The main symptoms of hypersomnia include:  Feeling extremely tired throughout the day.  Being very difficult to wake up.  Sleeping for longer and longer periods.  Taking naps throughout the day. Other symptoms may include:  Feeling:  Restless.  Annoyed.  Anxious.  Low energy.  Having difficulty:  Remembering.  Speaking.  Thinking.  Losing your appetite.  Experiencing hallucinations. How is this diagnosed? Hypersomnia may be diagnosed by:  Medical history and physical exam. This will include a sleep history.  Completing sleep logs.  Tests may also be done, such as:  Polysomnography.  Multiple sleep latency test (MSLT). How is this treated? There is no cure for hypersomnia, but treatment can be very effective in helping manage the condition. Treatment may include:  Lifestyle and sleeping strategies to help cope with the condition.  Stimulant medicines.  Treating any underlying causes of hypersomnia. Follow these instructions at home:  Take medicines only as directed by your health care provider.  Schedule short naps for when you feel sleepiest during the day. Tell your employer or teachers that you have hypersomnia. You may be able to adjust your schedule to include time for naps.  Avoid drinking alcohol or caffeinated beverages.  Do not eat a heavy meal before bedtime. Eat  at about the same times every day.  Do not drive or operate heavy machinery if you are sleepy.  Do not swim or go out on the water without a life jacket.  If possible, adjust your schedule so that you do not have to work or be active at night.  Keep all follow-up visits as directed by your health care provider. This is important. Contact a health care provider if:  You have new symptoms.  Your symptoms get worse. Get help right away if: You have serious thoughts of hurting yourself or someone else. This information is not intended to replace advice given to you by your health care provider. Make sure you discuss any questions you have with your health care provider. Document Released: 12/01/2002 Document Revised: 05/18/2016 Document Reviewed: 07/16/2014 Elsevier Interactive Patient Education  2017 [employer].

## 2017-04-02 NOTE — Progress Notes (Signed)
SLEEP MEDICINE CLINIC   Provider:  Larey Seat, M D  Primary Care Physician:  Jani Gravel, MD   Referring Provider: Dr. Jaynee Eagles  Chief Complaint  Patient presents with  . Sleep Consult    Rm 11. Patient states that she had a sleep study about 30 years ago but it was not complete, patient never went into REM sleep. Patient has trouble falling asleep without Trazodone, snores, nightmares, wake up feeling tired, morning headaches, daytime fatigue.     HPI:  [name] is a 71 y.o. female , seen here as in a referral  from Dr. Jaynee Eagles to evaluate this patient for sleep apnea.  [name] reports that she had morning headaches wakes up with a dry mouth and that her husband has noted her to snore heavily. Sometimes she wakes herself up snoring, choking or gasping. Over the last couple of months she has several times woken up feeling she could not breathe and needed to sit up to catch her next breath. These episodes have been very scary. She has also struggled with some hoarseness, and is currently wearing a wrist brace on her right lower arm.  Sleep habits are as follows:  She is usually aiming for a bedtime at about 9 PM, but she feels that she cannot go easily to sleep because her husband shares a bedroom with her and watches TV at night.The marital bedroom is neither dark nor quiet , but cool.  He tends to wake up from deep sleep if she switches the TV off. She struggles greatly initiating sleep. She usually likes to sleep on her side with 2 pillows elevating her head because of her GERD. She has been taking trazodone to help her sleep. Even with trazodone she will still wake up between 2 and 3 AM and usually again between 4 and 5 AM. Hip pain and fibromyalgia pain are the cause.  No nocturia reported. Dry mouth all night, and interstitial cystitis causing discomfort. She rises at 8.30 AM, but she feels that her deepest and most restorative sleep happens right before she has to rise. This  is dream rich sleep. Her overall sleep time is 8-9 hours but she craves 10-12 hours.   Sleep medical history and family sleep history: [name] was diagnosed with breast cancer in 2013, and has been taking tamoxifen. At one time she felt that tamoxifen may affect her cognitive function, test however have not documented decline in her memory ability. Because of morning sickness and headaches tamoxifen has meanwhile discontinued. She still carries a diagnosis of osteoarthritis, aspirin, coronary artery disease, chronic constipation, COPD, nocturia ( ?), fibromyalgia, GERD and continues to follow-up with Dr. Jana Hakim. She has overcome pneumonia , was on oxygen.   Social history: married female, with adult children ( 52, one son 16,  daughters  67 and 38 years old.)She is taking various herbal and vitamin products. Daughters works as Sport and exercise psychologist.   Dr Jaynee Eagles last visit : 03-07-2017 Primary Care Physician: Jani Gravel, MD CC: Memory problems, balance issues, vertigo, falls, tremor, fibromyalgia, bilateral CTS, snoring and morning headaches Interval history 03/07/2017: She has severe pain. Since her 52s she has had pain she hurts all over even to touch the skin. It hurts to touch her skin. It is hurting if she touches any part of her body with the other part. Dr. Maudie Mercury is working on that. Headaches come from the neck. She has "bulging disks". She hurts badly in the neck. She  has daily headaches. She has neck pain. She has chronic pain all over. She is frustrated.  Intreval history: Snoring wakes her up, she snore heavilty, she has morning headache, dry mouth. Daytime fatigue. She did vestibular therapy for her chronic dizziness. Discussed her normal neurocognitive testing which did not show neurodegenerative disease.    Review of Systems: Out of a complete 14 system review, the patient complains of only the following symptoms, and all other reviewed systems are negative. Snoring, pain,  dry mouth, GERD, hypersomnia.   Epworth score 8 , Fatigue severity score 50  , depression score 5/15   Social History   Social History  . Marital status: Married    Spouse name: Jori Moll   . Number of children: 3  . Years of education: BA   Occupational History  . Retired Therapist, sports Retired   Social History Main Topics  . Smoking status: Former Smoker    Packs/day: 1.00    Years: 15.00    Types: Cigarettes    Quit date: 05/27/2012  . Smokeless tobacco: Never Used  . Alcohol use 0.0 oz/week     Comment: occasional  . Drug use: No  . Sexual activity: Not on file   Other Topics Concern  . Not on file   Social History Narrative   Lives with husband.   Caffeine use: 1/2 cup per day   Exercise-- 2days a week [employer],  Water aerobics, walking     Family History  Problem Relation Age of Onset  . Rectal cancer Mother   . Cancer Mother     rectal,  breast, pancreatic  . Colon cancer Mother   . Cancer Maternal Aunt     breast  . Irritable bowel syndrome Son   . Irritable bowel syndrome Daughter   . Heart disease Other   . Arthritis Other   . Cancer Other     breast cancer 1st degree relative <50, lung  . Diabetes Other     1st degree relative   . Hyperlipidemia Other   . Hypertension Other   . Osteoporosis Other   . Coronary artery disease Other 65  . Colon cancer Father     Past Medical History:  Diagnosis Date  . Arthritis   . Asthma   . CAD (coronary artery disease) CARDIOLOGIST--  DR Angelena Form   mild non-obstructive cad  . Chronic constipation   . COPD (chronic obstructive pulmonary disease) ([place])   . Fibromyalgia   . Frequency of urination   . GERD (gastroesophageal reflux disease)   . H/O hiatal hernia   . History of basal cell carcinoma excision    X2  . History of breast cancer ONCOLOGIST-- DR Jana Hakim---  NO RECURRANCE   DX 07/2012;  LOW GRADE DCIS  ER+PR+  ----  S/P RIGHT LUMPECTOMY WITH NEGATIVE MARGINS/   RADIATION ENDED 11/2012  . History of chronic  bronchitis   . History of colonic polyps   . History of tachycardia    CONTROLLED  WITH ATENOLOL  . Hypertension   . Osteoporosis   . Pelvic pain   . S/P radiation therapy 11/12/12 - 12/05/12   right Breast  . Sinus headache   . Urgency of urination     Past Surgical History:  Procedure Laterality Date  . [place] STUDY N/A 04/03/2016   Procedure: [place] STUDY;  Surgeon: Manus Gunning, MD;  [facility]: [facility];  Service: Gastroenterology;  Laterality: N/A;  . BREAST LUMPECTOMY Right 10-11-2012  W/ SLN BX  . CARDIAC CATHETERIZATION  09-13-2007  DR Lia Foyer   WELL-PRESERVED LVF/  DIFFUSE SCATTERED CORONARY CALCIFACATION AND ATHEROSCLEROSIS WITHOUT OBSTRUCTION  . CARDIAC CATHETERIZATION  08-04-2010  DR Angelena Form   NON-OBSTRUCTIVE CAD/  pLAD 40%/  oLAD 30%/  mLAD 30%/  pRCA 30%/  EF 60%  . CARDIOVASCULAR STRESS TEST  06-18-2012  DR McALHANY   LOW RISK NUCLEAR STUDY/  SMALL FIXED AREA OF MODERATELY DECREASED UPTAKE IN ANTEROSEPTAL WALL WHICH MAY BE ARTIFACTUAL/  NO ISCHEMIA/  EF 68%  . COLONOSCOPY  09-29-2010  . CYSTOSCOPY WITH HYDRODISTENSION AND BIOPSY N/A 03/06/2014   Procedure: CYSTOSCOPY/HYDRODISTENSION/ INSTILATION OF MARCAINE AND PYRIDIUM;  Surgeon: Ailene Rud, MD;  [facility];  Service: Urology;  Laterality: N/A;  . ESOPHAGEAL MANOMETRY N/A 04/03/2016   Procedure: ESOPHAGEAL MANOMETRY (EM);  Surgeon: Manus Gunning, MD;  [facility];  Service: Gastroenterology;  Laterality: N/A;  . NASAL SINUS SURGERY  1985  . ORIF RIGHT ANKLE  FX  2006  . REMOVAL VOCAL CORD CYST  FEB 2014  . RIGHT BREAST BX  08-23-2012  . RIGHT HAND SURGERY  X3  LAST ONE 2009   INCLUDES  ORIF RIGHT 5TH FINGER AND REVISION TWICE  . TONSILLECTOMY AND ADENOIDECTOMY  AGE 21  . TOTAL ABDOMINAL HYSTERECTOMY W/ BILATERAL SALPINGOOPHORECTOMY  1982   W/  APPENDECTOMY  . TRANSTHORACIC ECHOCARDIOGRAM  06-24-2012   GRADE I DIASTOLIC DYSFUNCTION/  EF  55-60%/  MILD MR    Current Outpatient Prescriptions  Medication Sig Dispense Refill  . acetaminophen (TYLENOL) 500 MG tablet Take 1,000 mg by mouth every 6 (six) hours as needed for mild pain or headache.     . albuterol (PROVENTIL HFA;VENTOLIN HFA) 108 (90 BASE) MCG/ACT inhaler Inhale 2 puffs into the lungs every 4 (four) hours as needed. 1 Inhaler 5  . aspirin EC 81 MG tablet Take 1 tablet (81 mg total) by mouth daily. 90 tablet 3  . atenolol (TENORMIN) 25 MG tablet Take 25 mg by mouth daily.    . DULoxetine (CYMBALTA) 60 MG capsule TAKE 1 BY MOUTH TWICE DAILY 180 capsule 1  . Estradiol 10 MCG TABS vaginal tablet Place 1 tablet (10 mcg total) vaginally 2 (two) times a week. 8 tablet 11  . fluticasone (FLONASE) 50 MCG/ACT nasal spray Place 1 spray into the nose as needed for allergies.     . furosemide (LASIX) 20 MG tablet Take 1 tablet (20 mg total) by mouth every morning. 180 tablet 1  . gabapentin (NEURONTIN) 300 MG capsule Take 1 capsule (300 mg total) by mouth 2 (two) times daily. (Patient taking differently: Take 300 mg by mouth 3 (three) times daily. ) 60 capsule 6  . loratadine (CLARITIN) 10 MG tablet Take 1 tablet (10 mg total) by mouth daily. 30 tablet 11  . omeprazole (PRILOSEC) 40 MG capsule Take 40 mg by mouth 2 (two) times daily.    . pentosan polysulfate (ELMIRON) 100 MG capsule Take 1 capsule (100 mg total) by mouth 3 (three) times daily. Reported on 01/05/2016 (Patient taking differently: Take 100 mg by mouth 3 (three) times daily with meals as needed. Reported on 01/05/2016) 90 capsule 11  . Probiotic Product (ACIDOPHILUS PROBIOTIC COMPLEX PO) Take 1 capsule by mouth daily.    . traMADol (ULTRAM) 50 MG tablet Take 1-2 tablets (50-100 mg total) by mouth 3 (three) times daily as needed for moderate pain. 180 tablet 0  . traZODone (DESYREL) 100 MG tablet Take  1 tablet (100 mg total) by mouth at bedtime. 180 tablet 1  . URELLE (URELLE/URISED) 81 MG TABS tablet Take 1 tablet (81 mg  total) by mouth 3 (three) times daily. 30 each 2   No current facility-administered medications for this visit.    Facility-Administered Medications Ordered in Other Visits  Medication Dose Route Frequency Provider Last Rate Last Dose  . bupivacaine (MARCAINE) 0.5 % 10 mL, triamcinolone acetonide (KENALOG-40) 40 mg injection   Subcutaneous Once Carolan Clines, MD      . bupivacaine (MARCAINE) 0.5 % 15 mL, phenazopyridine (PYRIDIUM) 400 mg bladder mixture   Bladder Instillation Once Carolan Clines, MD        Allergies as of 04/02/2017 - Review Complete 04/02/2017  Allergen Reaction Noted  . Clindamycin hcl Shortness Of Breath and Rash 04/03/2011  . Penicillins Anaphylaxis 01/11/2010  . Prednisone Shortness Of Breath and Rash 06/16/2011  . Codeine Hives and Other (See Comments) 01/11/2010  . Fluzone [flu virus vaccine] Other (See Comments) 09/29/2014  . Latex Hives 08/29/2012  . Pentazocine lactate Other (See Comments) 01/11/2010  . Pneumococcal vaccine polyvalent Hives, Swelling, and Other (See Comments) 01/31/2011  . Tamoxifen Nausea And Vomiting and Other (See Comments) 03/03/2014    Vitals: BP 112/62   Pulse 74   Resp 14   Ht 5' 3.5" (1.613 m)   Wt 115 lb (52.2 kg)   BMI 20.05 kg/m  Last Weight:  Wt Readings from Last 1 Encounters:  04/02/17 115 lb (52.2 kg)   ASN:KNLZ mass index is 20.05 kg/m.     Last Height:   Ht Readings from Last 1 Encounters:  04/02/17 5' 3.5" (1.613 m)    Physical exam:  General: The patient is awake, alert and appears not in acute distress. The patient is well groomed. Head: Normocephalic, atraumatic. Neck is supple. Mallampati 2,  neck circumference: 14. Nasal airflow patent , TMJ is not  evident . Retrognathia is not seen.  Cardiovascular:  Regular rate and rhythm , without  murmurs or carotid bruit, and without distended neck veins. Respiratory: Lungs are clear to auscultation. Skin:  Without evidence of edema, or rash Trunk: BMI  is 20. The patient's posture is erect    Neurologic exam : The patient is awake and alert, oriented to place and time.   MMSE: MMSE - Mini Mental State Exam 07/26/2016  Orientation to time 4  Orientation to Place 4  Registration 3  Attention/ Calculation 5  Recall 2  Language- name 2 objects 2  Language- repeat 1  Language- follow 3 step command 2  Language- read & follow direction 1  Write a sentence 1  Copy design 0  Total score 25       Attention span & concentration ability appears normal.  Speech is fluent,  without  dysarthria, but dysphonia .  Mood and affect are appropriate.  Cranial nerves: Pupils are equal and briskly reactive to light. Funduscopic exam without evidence of pallor or edema.  Extraocular movements  in vertical and horizontal planes intact and without nystagmus. Visual fields by finger perimetry are intact. Hearing to finger rub intact.  Facial sensation intact to fine touch.Facial motor strength is symmetric and tongue and uvula move midline. Shoulder shrug was symmetrical.  Motor exam:  Normal tone, muscle bulk and symmetric strength in all extremities. Sensory:  Fine touch, pinprick and vibration ,  Proprioception tested in the upper extremities was normal. Coordination: Rapid alternating movements in the fingers/hands was normal.  Finger-to-nose maneuver  normal without evidence of ataxia, dysmetria or tremor. Gait and station: Patient walks without assistive device and is able unassisted to climb up to the exam table. Strength within normal limits.Stance is stable and normal. Romberg testing is negative. Deep tendon reflexes: in the  upper and lower extremities are symmetric and intact. Babinski maneuver response is downgoing.   Assessment:  After physical and neurologic examination, review of laboratory studies,  Personal review of imaging studies, reports of other /same  Imaging studies, results of polysomnography and / or neurophysiology testing and  pre-existing records as far as provided in visit., my assessment is   1) OSA ? Based on the patient's and her husband's observation and repot, I will order a Sleep study, attended.   2) postmenopausal but still struggling with insomnia since HRT was discontinued.   3) pain - a separate issue, but affecting the patient's.   The patient was advised of the nature of the diagnosed disorder , the treatment options and the  risks for general health and wellness arising from not treating the condition.   I spent more than 40  minutes of face to face time with the patient.  Greater than 50% of time was spent in counseling and coordination of care. We have discussed the diagnosis and differential and I answered the patient's questions.    Plan:  Treatment plan and additional workup :  SPLIT night with hypoxemia and hypercapnia watch, patient has COPD -, may have overlap. She has asthma, too.  See if headaches respond.    Larey Seat, MD 02/02/210, 1:55 AM  Certified in Neurology by ABPN Certified in [place] by [facility] [address], [place], [place] [address]

## 2017-04-02 NOTE — Telephone Encounter (Signed)
No information from pt left VM to call with decision.Calcium 8.9  02/11/17 .  No PA needed. Deductible met, Co- insurance 20% [id number] . Medicare allowable $1065.00. With OV $50 no OV no co pay. Called patient asked her to call me with her decision.

## 2017-04-04 ENCOUNTER — Ambulatory Visit (INDEPENDENT_AMBULATORY_CARE_PROVIDER_SITE_OTHER): Payer: Medicare Other | Admitting: Physical Therapy

## 2017-04-04 ENCOUNTER — Encounter: Payer: Self-pay | Admitting: Physical Therapy

## 2017-04-04 DIAGNOSIS — M542 Cervicalgia: Secondary | ICD-10-CM | POA: Diagnosis not present

## 2017-04-04 DIAGNOSIS — M62838 Other muscle spasm: Secondary | ICD-10-CM | POA: Diagnosis not present

## 2017-04-04 DIAGNOSIS — M6281 Muscle weakness (generalized): Secondary | ICD-10-CM

## 2017-04-04 NOTE — Therapy (Signed)
[facility] [place] [place], [place], [address] Phone: [phone]   Fax:  [phone]  Physical Therapy Treatment  Patient Details  Name: [name] MRN: [id number] Date of Birth: [birth year] Referring Provider: Dr Jaynee Eagles  Encounter Date: 04/04/2017      PT End of Session - 04/04/17 1103    Visit Number 5   Number of Visits 12   Date for PT Re-Evaluation 04/25/17   PT Start Time 1103   PT Stop Time 1157   PT Time Calculation (min) 54 min   Activity Tolerance Patient limited by pain      Past Medical History:  Diagnosis Date  . Arthritis   . Asthma   . CAD (coronary artery disease) CARDIOLOGIST--  DR Angelena Form   mild non-obstructive cad  . Chronic constipation   . COPD (chronic obstructive pulmonary disease) ([place])   . Fibromyalgia   . Frequency of urination   . GERD (gastroesophageal reflux disease)   . H/O hiatal hernia   . History of basal cell carcinoma excision    X2  . History of breast cancer ONCOLOGIST-- DR Jana Hakim---  NO RECURRANCE   DX 07/2012;  LOW GRADE DCIS  ER+PR+  ----  S/P RIGHT LUMPECTOMY WITH NEGATIVE MARGINS/   RADIATION ENDED 11/2012  . History of chronic bronchitis   . History of colonic polyps   . History of tachycardia    CONTROLLED  WITH ATENOLOL  . Hypertension   . Osteoporosis   . Pelvic pain   . S/P radiation therapy 11/12/12 - 12/05/12   right Breast  . Sinus headache   . Urgency of urination     Past Surgical History:  Procedure Laterality Date  . [place] STUDY N/A 04/03/2016   Procedure: [place] STUDY;  Surgeon: Manus Gunning, MD;  [facility];  Service: Gastroenterology;  Laterality: N/A;  . BREAST LUMPECTOMY Right 10-11-2012   W/ SLN BX  . CARDIAC CATHETERIZATION  09-13-2007  DR Lia Foyer   WELL-PRESERVED LVF/  DIFFUSE SCATTERED CORONARY CALCIFACATION AND ATHEROSCLEROSIS WITHOUT OBSTRUCTION  . CARDIAC CATHETERIZATION  08-04-2010  DR Angelena Form    NON-OBSTRUCTIVE CAD/  pLAD 40%/  oLAD 30%/  mLAD 30%/  pRCA 30%/  EF 60%  . CARDIOVASCULAR STRESS TEST  06-18-2012  DR McALHANY   LOW RISK NUCLEAR STUDY/  SMALL FIXED AREA OF MODERATELY DECREASED UPTAKE IN ANTEROSEPTAL WALL WHICH MAY BE ARTIFACTUAL/  NO ISCHEMIA/  EF 68%  . COLONOSCOPY  09-29-2010  . CYSTOSCOPY WITH HYDRODISTENSION AND BIOPSY N/A 03/06/2014   Procedure: CYSTOSCOPY/HYDRODISTENSION/ INSTILATION OF MARCAINE AND PYRIDIUM;  Surgeon: Ailene Rud, MD;  Location: [facility];  Service: Urology;  Laterality: N/A;  . ESOPHAGEAL MANOMETRY N/A 04/03/2016   Procedure: ESOPHAGEAL MANOMETRY (EM);  Surgeon: Manus Gunning, MD;  [facility];  Service: Gastroenterology;  Laterality: N/A;  . NASAL SINUS SURGERY  1985  . ORIF RIGHT ANKLE  FX  2006  . REMOVAL VOCAL CORD CYST  FEB 2014  . RIGHT BREAST BX  08-23-2012  . RIGHT HAND SURGERY  X3  LAST ONE 2009   INCLUDES  ORIF RIGHT 5TH FINGER AND REVISION TWICE  . TONSILLECTOMY AND ADENOIDECTOMY  AGE 71  . TOTAL ABDOMINAL HYSTERECTOMY W/ BILATERAL SALPINGOOPHORECTOMY  1982   W/  APPENDECTOMY  . TRANSTHORACIC ECHOCARDIOGRAM  06-24-2012   GRADE I DIASTOLIC DYSFUNCTION/  EF 55-60%/  MILD MR    There were no vitals filed for this  visit.      Subjective Assessment - 04/04/17 1103    Subjective Pt had a fall on Sunday landing on her Rt side, was seen in the ED had multiple x-rays and scan of her head and they were all negative. She is having more back and neck pain now. Going to have a sleep study to see if it shows anything   Pertinent History osteoporosis, scoliosis, h/o BPPV, fibromyalgia, h/o breast CA 2013   Patient Stated Goals wishes to not have to need more pain medications, sleep better at night, be able to do some exercise and have less pain like before   Currently in Pain? Yes   Pain Score 7    Pain Location Back  and neck   Pain Orientation Right;Left   Pain Descriptors / Indicators  Sore;Aching;Cramping;Tightness   Pain Type Chronic pain  with flare up due to fall 3 days ago   Pain Onset More than a month ago   Pain Frequency Constant   Aggravating Factors  all movement hurts since fall   Pain Relieving Factors nothing right now                         [facility] Adult PT Treatment/Exercise - 04/04/17 0001      Exercises   Exercises Neck     Neck Exercises: Machines for Strengthening   UBE (Upper Arm Bike) L2x 4' alt FWD/BWD     Neck Exercises: Supine   Cervical Isometrics Extension;20 reps  head press   Other Supine Exercise 20 reps shoulder presses, leg presses, leg lengtheners     Modalities   Modalities Electrical Stimulation;Moist Heat     Moist Heat Therapy   Number Minutes Moist Heat 15 Minutes   Moist Heat Location Cervical;Lumbar Spine     Electrical Stimulation   Electrical Stimulation Location thoracic, lumbar   Electrical Stimulation Action IFC   Electrical Stimulation Parameters to tolerance   Electrical Stimulation Goals Pain;Tone                     PT Long Term Goals - 04/04/17 1129      PT LONG TERM GOAL #1   Title independent with HEP (04/25/17)   Status On-going     PT LONG TERM GOAL #2   Title improve cervical ROM bilat rotation =/> 60 degrees ( 04/25/17)    Status Achieved     PT LONG TERM GOAL #3   Title report 75% improvement in symtoms (04/25/17)   Status On-going     PT LONG TERM GOAL #4   Title improve FOTO =/< 53% limited, CK level ( 04/25/17)    Status On-going     PT LONG TERM GOAL #5   Title demo UE strength =/> 5-/5 ( 04/25/17)    Status Partially Met               Plan - 04/04/17 1116    Clinical Impression Statement Raya has had increased pain in the back side of her body due to fall on Sunday.  Performed gentle stretching exercise and motion to work on releasing the tightness. She had some decrease in pain after treatment    Rehab Potential Good   PT Frequency 2x / week   PT  Duration 6 weeks   PT Treatment/Interventions Moist Heat;Ultrasound;Therapeutic exercise;Dry needling;Taping;Manual techniques;Neuromuscular re-education;Cryotherapy;Electrical Stimulation;Patient/family education;Passive range of motion   PT Next Visit Plan if doing better from the fall  progress stabilization ex.    Consulted and Agree with Plan of Care Patient      Patient will benefit from skilled therapeutic intervention in order to improve the following deficits and impairments:  Decreased range of motion, Dizziness, Increased muscle spasms, Impaired UE functional use, Pain, Decreased strength  Visit Diagnosis: Muscle weakness (generalized)  Cervicalgia  Other muscle spasm     Problem List Patient Active Problem List   Diagnosis Date Noted  . Excessive postexertional fatigue 04/02/2017  . Pneumonia due to virus 04/02/2017  . Chronic interstitial cystitis 02/01/2017  . Acute pharyngitis 01/02/2017  . Dysphagia   . Esophageal reflux   . Allergic rhinitis 01/25/2016  . Shortness of breath   . Left hip pain 04/30/2015  . Sciatica 04/30/2015  . Knee pain, right 04/30/2015  . Leukocytes in urine 04/30/2015  . Hyperlipidemia 03/01/2015  . Bilateral hand numbness 03/01/2015  . Pulmonary nodules 02/12/2015  . External hemorrhoids 02/05/2015  . Vaginitis and vulvovaginitis 02/05/2015  . Chronic night sweats 01/08/2015  . Atypical chest pain 01/01/2015  . Chest pain 01/01/2015  . Renal insufficiency 07/16/2014  . Sepsis secondary to UTI ([place]) 02/05/2014  . Grief reaction 12/29/2013  . Urinary hesitancy 12/04/2013  . Abdominal pain, other specified site 10/26/2013  . Headache(784.0) 10/13/2013  . Pain, joint, multiple sites 08/18/2013  . Vasculitis ([place]) 07/29/2013  . ANA positive 07/29/2013  . Other and unspecified hyperlipidemia 07/12/2013  . Contusion, chest wall 04/30/2013  . Malignant neoplasm of lower-outer quadrant of female breast ([place]) 01/03/2013  . Breast  cancer, right breast ([place]) 10/17/2012  . Splenic lesion 09/02/2012  . Asthma, allergic 08/05/2012  . GERD (gastroesophageal reflux disease) 06/03/2012  . CAD (coronary artery disease) 06/03/2012  . Former heavy tobacco smoker 06/03/2012  . Edema 04/08/2012  . Stricture and stenosis of esophagus 10/19/2011  . Constipation - functional 07/21/2011  . Nonspecific (abnormal) findings on radiological and other examination of biliary tract 07/21/2011  . Acute UTI 06/16/2011  . Osteoporosis 04/17/2011  . POSTMENOPAUSAL STATUS 01/27/2011  . CONSTIPATION 11/02/2010  . FIBROCYSTIC BREAST DISEASE, HX OF 10/18/2010  . CAD, NATIVE VESSEL 08/31/2010  . Essential hypertension 08/25/2010  . CAROTID ARTERY DISEASE 08/25/2010  . TOBACCO ABUSE, HX OF 08/25/2010  . GENERALIZED ANXIETY DISORDER 08/03/2010  . FIBROMYALGIA 01/11/2010  . SKIN CANCER, HX OF 01/11/2010    Jeral Pinch PT  04/04/2017, 11:45 AM  [facility] [place], [place], [address] Phone: [phone]   Fax:  [phone]  Name: Albertine Lafoy MRN: [id number] Date of Birth: 1946/10/01

## 2017-04-06 DIAGNOSIS — K5900 Constipation, unspecified: Secondary | ICD-10-CM | POA: Diagnosis not present

## 2017-04-06 DIAGNOSIS — G8929 Other chronic pain: Secondary | ICD-10-CM | POA: Diagnosis not present

## 2017-04-06 DIAGNOSIS — M5440 Lumbago with sciatica, unspecified side: Secondary | ICD-10-CM | POA: Diagnosis not present

## 2017-04-06 DIAGNOSIS — M791 Myalgia: Secondary | ICD-10-CM | POA: Diagnosis not present

## 2017-04-09 ENCOUNTER — Encounter: Payer: Self-pay | Admitting: Anesthesiology

## 2017-04-12 ENCOUNTER — Ambulatory Visit (INDEPENDENT_AMBULATORY_CARE_PROVIDER_SITE_OTHER): Payer: Medicare Other | Admitting: Physical Therapy

## 2017-04-12 ENCOUNTER — Encounter: Payer: Self-pay | Admitting: Physical Therapy

## 2017-04-12 DIAGNOSIS — M62838 Other muscle spasm: Secondary | ICD-10-CM

## 2017-04-12 DIAGNOSIS — M542 Cervicalgia: Secondary | ICD-10-CM | POA: Diagnosis not present

## 2017-04-12 DIAGNOSIS — M6281 Muscle weakness (generalized): Secondary | ICD-10-CM

## 2017-04-12 NOTE — Therapy (Signed)
[facility] [address] [place], [place], [address] Phone: [phone]   Fax:  [phone]  Physical Therapy Treatment  Patient Details  Name: [name] MRN: [id number] Date of Birth: [birth year] Referring Provider: Dr Milta Deiters  Encounter Date: 04/12/2017      PT End of Session - 04/12/17 1402    Visit Number 6   Number of Visits 12   Date for PT Re-Evaluation 04/25/17   PT Start Time 4481   PT Stop Time 1454   PT Time Calculation (min) 51 min   Activity Tolerance Patient limited by fatigue      Past Medical History:  Diagnosis Date  . Arthritis   . Asthma   . CAD (coronary artery disease) CARDIOLOGIST--  DR Angelena Form   mild non-obstructive cad  . Chronic constipation   . COPD (chronic obstructive pulmonary disease) ([place])   . Fibromyalgia   . Frequency of urination   . GERD (gastroesophageal reflux disease)   . H/O hiatal hernia   . History of basal cell carcinoma excision    X2  . History of breast cancer ONCOLOGIST-- DR Jana Hakim---  NO RECURRANCE   DX 07/2012;  LOW GRADE DCIS  ER+PR+  ----  S/P RIGHT LUMPECTOMY WITH NEGATIVE MARGINS/   RADIATION ENDED 11/2012  . History of chronic bronchitis   . History of colonic polyps   . History of tachycardia    CONTROLLED  WITH ATENOLOL  . Hypertension   . Osteoporosis   . Pelvic pain   . S/P radiation therapy 11/12/12 - 12/05/12   right Breast  . Sinus headache   . Urgency of urination     Past Surgical History:  Procedure Laterality Date  . [place] STUDY N/A 04/03/2016   Procedure: [place] STUDY;  Surgeon: Manus Gunning, MD;  Location: [facility];  Service: Gastroenterology;  Laterality: N/A;  . BREAST LUMPECTOMY Right 10-11-2012   W/ SLN BX  . CARDIAC CATHETERIZATION  09-13-2007  DR Lia Foyer   WELL-PRESERVED LVF/  DIFFUSE SCATTERED CORONARY CALCIFACATION AND ATHEROSCLEROSIS WITHOUT OBSTRUCTION  . CARDIAC CATHETERIZATION  08-04-2010  DR  Angelena Form   NON-OBSTRUCTIVE CAD/  pLAD 40%/  oLAD 30%/  mLAD 30%/  pRCA 30%/  EF 60%  . CARDIOVASCULAR STRESS TEST  06-18-2012  DR McALHANY   LOW RISK NUCLEAR STUDY/  SMALL FIXED AREA OF MODERATELY DECREASED UPTAKE IN ANTEROSEPTAL WALL WHICH MAY BE ARTIFACTUAL/  NO ISCHEMIA/  EF 68%  . COLONOSCOPY  09-29-2010  . CYSTOSCOPY WITH HYDRODISTENSION AND BIOPSY N/A 03/06/2014   Procedure: CYSTOSCOPY/HYDRODISTENSION/ INSTILATION OF MARCAINE AND PYRIDIUM;  Surgeon: Ailene Rud, MD;  Location: [facility];  Service: Urology;  Laterality: N/A;  . ESOPHAGEAL MANOMETRY N/A 04/03/2016   Procedure: ESOPHAGEAL MANOMETRY (EM);  Surgeon: Manus Gunning, MD;  Location: [facility];  Service: Gastroenterology;  Laterality: N/A;  . NASAL SINUS SURGERY  1985  . ORIF RIGHT ANKLE  FX  2006  . REMOVAL VOCAL CORD CYST  FEB 2014  . RIGHT BREAST BX  08-23-2012  . RIGHT HAND SURGERY  X3  LAST ONE 2009   INCLUDES  ORIF RIGHT 5TH FINGER AND REVISION TWICE  . TONSILLECTOMY AND ADENOIDECTOMY  AGE 65  . TOTAL ABDOMINAL HYSTERECTOMY W/ BILATERAL SALPINGOOPHORECTOMY  1982   W/  APPENDECTOMY  . TRANSTHORACIC ECHOCARDIOGRAM  06-24-2012   GRADE I DIASTOLIC DYSFUNCTION/  EF 55-60%/  MILD MR    There were no vitals filed for this  visit.      Subjective Assessment - 04/12/17 1405    Subjective Pt is frustrated that her Rt hand/wrist is still bothering her after almost 2 wks from her fall.  Back and neck are sore however she was in the car for 4 [place].    Patient Stated Goals wishes to not have to need more pain medications, sleep better at night, be able to do some exercise and have less pain like before   Currently in Pain? Yes   Pain Score 5    Pain Location Back   Pain Orientation Right;Left   Pain Descriptors / Indicators Dull   Pain Type Chronic pain   Pain Onset More than a month ago   Pain Frequency Constant   Aggravating Factors  sitting in the car for long  rides   Pain Relieving Factors nothing   Effect of Pain on Daily Activities having pain in the Rt wrist 8-9/10 constant.             [facility] PT Assessment - 04/12/17 0001      Assessment   Medical Diagnosis Musculoskeletal neck pain   Referring Provider Dr Milta Deiters   Next MD Visit 04/21/17     Palpation   Palpation comment pt has pain in the Rt wrist along the dorsal distal radius, scaphoid and trapezuim.  Rec pt follow up with her primary MD is still bothering her alot next week.                      [facility] Adult PT Treatment/Exercise - 04/12/17 0001      Exercises   Exercises Neck     Neck Exercises: Machines for Strengthening   UBE (Upper Arm Bike) L2x 4' alt FWD/BWD     Neck Exercises: Standing   Upper Extremity D1 Flexion;Theraband  3x10, each side, VC for form   Theraband Level (UE D1) Level 1 (Yellow)     Neck Exercises: Seated   W Back --  30 reps   Other Seated Exercise scapula retraction x 40 focus on lower traps     Modalities   Modalities Electrical Stimulation;Moist Heat     Moist Heat Therapy   Number Minutes Moist Heat 15 Minutes   Moist Heat Location Cervical;Lumbar Spine     Electrical Stimulation   Electrical Stimulation Location cervical   Electrical Stimulation Action IFC   Electrical Stimulation Parameters to tolerance   Electrical Stimulation Goals Pain     Neck Exercises: Stretches   Other Neck Stretches 3 way doorway stretch  VC's for form with overhead stretch                     PT Long Term Goals - 04/12/17 1407      PT LONG TERM GOAL #1   Title independent with HEP (04/25/17)   Status On-going  been a little limited with her band exercises d/t wrist pain     PT LONG TERM GOAL #2   Title improve cervical ROM bilat rotation =/> 60 degrees ( 04/25/17)    Status Achieved     PT LONG TERM GOAL #3   Title report 75% improvement in symtoms (04/25/17)   Status On-going     PT LONG TERM GOAL #4   Title improve  FOTO =/< 53% limited, CK level ( 04/25/17)    Status On-going     PT LONG TERM GOAL #5   Title demo  UE strength =/> 5-/5 ( 04/25/17)    Status Partially Met               Plan - 04/12/17 1429    Clinical Impression Statement [name] struggled with the higher level cervical stabilization exercise in standing. Required VCs for form to protect her back.  She is focused on her Rt wrist pain from the fall. Recommend she follow up with her primary MD next week if she is still having pain.  She also had pain in her low back with exercise. No new goals met.    Rehab Potential Good   PT Frequency 2x / week   PT Duration 6 weeks   PT Treatment/Interventions Moist Heat;Ultrasound;Therapeutic exercise;Dry needling;Taping;Manual techniques;Neuromuscular re-education;Cryotherapy;Electrical Stimulation;Patient/family education;Passive range of motion   PT Next Visit Plan add in stretching using pool noodle to open up thoracic area.    Consulted and Agree with Plan of Care Patient      Patient will benefit from skilled therapeutic intervention in order to improve the following deficits and impairments:  Decreased range of motion, Dizziness, Increased muscle spasms, Impaired UE functional use, Pain, Decreased strength  Visit Diagnosis: Muscle weakness (generalized)  Cervicalgia  Other muscle spasm     Problem List Patient Active Problem List   Diagnosis Date Noted  . Excessive postexertional fatigue 04/02/2017  . Pneumonia due to virus 04/02/2017  . Chronic interstitial cystitis 02/01/2017  . Acute pharyngitis 01/02/2017  . Dysphagia   . Esophageal reflux   . Allergic rhinitis 01/25/2016  . Shortness of breath   . Left hip pain 04/30/2015  . Sciatica 04/30/2015  . Knee pain, right 04/30/2015  . Leukocytes in urine 04/30/2015  . Hyperlipidemia 03/01/2015  . Bilateral hand numbness 03/01/2015  . Pulmonary nodules 02/12/2015  . External hemorrhoids 02/05/2015  . Vaginitis and  vulvovaginitis 02/05/2015  . Chronic night sweats 01/08/2015  . Atypical chest pain 01/01/2015  . Chest pain 01/01/2015  . Renal insufficiency 07/16/2014  . Sepsis secondary to UTI ([place]) 02/05/2014  . Grief reaction 12/29/2013  . Urinary hesitancy 12/04/2013  . Abdominal pain, other specified site 10/26/2013  . Headache(784.0) 10/13/2013  . Pain, joint, multiple sites 08/18/2013  . Vasculitis ([place]) 07/29/2013  . ANA positive 07/29/2013  . Other and unspecified hyperlipidemia 07/12/2013  . Contusion, chest wall 04/30/2013  . Malignant neoplasm of lower-outer quadrant of female breast ([place]) 01/03/2013  . Breast cancer, right breast ([place]) 10/17/2012  . Splenic lesion 09/02/2012  . Asthma, allergic 08/05/2012  . GERD (gastroesophageal reflux disease) 06/03/2012  . CAD (coronary artery disease) 06/03/2012  . Former heavy tobacco smoker 06/03/2012  . Edema 04/08/2012  . Stricture and stenosis of esophagus 10/19/2011  . Constipation - functional 07/21/2011  . Nonspecific (abnormal) findings on radiological and other examination of biliary tract 07/21/2011  . Acute UTI 06/16/2011  . Osteoporosis 04/17/2011  . POSTMENOPAUSAL STATUS 01/27/2011  . CONSTIPATION 11/02/2010  . FIBROCYSTIC BREAST DISEASE, HX OF 10/18/2010  . CAD, NATIVE VESSEL 08/31/2010  . Essential hypertension 08/25/2010  . CAROTID ARTERY DISEASE 08/25/2010  . TOBACCO ABUSE, HX OF 08/25/2010  . GENERALIZED ANXIETY DISORDER 08/03/2010  . FIBROMYALGIA 01/11/2010  . SKIN CANCER, HX OF 01/11/2010    Jeral Pinch PT  04/12/2017, 2:44 PM  [facility] [place], [place], [address] Phone: [phone]   Fax:  [phone]  Name: [name] MRN: [id number] Date of Birth: 01-18-1946

## 2017-04-13 NOTE — Telephone Encounter (Signed)
PC from patient dentist [place] she has no dental problems.Appointment 04/17/17

## 2017-04-16 ENCOUNTER — Encounter: Payer: Self-pay | Admitting: Anesthesiology

## 2017-04-17 ENCOUNTER — Ambulatory Visit (INDEPENDENT_AMBULATORY_CARE_PROVIDER_SITE_OTHER): Payer: Medicare Other | Admitting: Gynecology

## 2017-04-17 ENCOUNTER — Encounter: Payer: Self-pay | Admitting: Gynecology

## 2017-04-17 VITALS — BP 120/70

## 2017-04-17 DIAGNOSIS — N760 Acute vaginitis: Secondary | ICD-10-CM

## 2017-04-17 DIAGNOSIS — N898 Other specified noninflammatory disorders of vagina: Secondary | ICD-10-CM | POA: Diagnosis not present

## 2017-04-17 DIAGNOSIS — M810 Age-related osteoporosis without current pathological fracture: Secondary | ICD-10-CM

## 2017-04-17 DIAGNOSIS — B9689 Other specified bacterial agents as the cause of diseases classified elsewhere: Secondary | ICD-10-CM

## 2017-04-17 LAB — WET PREP FOR TRICH, YEAST, CLUE
Trich, Wet Prep: NONE SEEN
Yeast Wet Prep HPF POC: NONE SEEN

## 2017-04-17 MED ORDER — TINIDAZOLE 500 MG PO TABS: ORAL_TABLET | ORAL | 0 refills | Status: DC

## 2017-04-17 MED ORDER — DENOSUMAB 60 MG/ML ~~LOC~~ SOLN
60.0000 mg | Freq: Once | SUBCUTANEOUS | Status: AC
  Administered 2017-04-17: 60 mg via SUBCUTANEOUS

## 2017-04-17 NOTE — Addendum Note (Signed)
Addended by: Thamas Jaegers on: 04/17/2017 02:39 PM   Modules accepted: Orders

## 2017-04-17 NOTE — Progress Notes (Signed)
   Patient is a 71 year old that presented to the office today complaining of a clear discharge with some fishy odor past few days. Patient denies any fever, chills, nausea, vomiting or any back pain and denies any GU or GI complaints. Patient with prior history of total abdominal hysterectomy bilateral salpingo-oophorectomy on no hormone replacement therapy. Patient was also here to receive her Prolia  injection as a result of her Osteoporosis.  Exam: Abdomen: Soft nontender no rebound or guarding Pelvic: Bartholin urethra Skene was within normal limits Vagina clear discharge noted vaginal cuff intact no other lesions noted Bimanual exam not done Rectal exam not done  Wet prep: Few clue cells, few white blood cell, too numerous to count bacteria  Assessment/plan: Patient with bacterial vaginosis will be to with Tindamax 500 mg tablets. She will take 4 tablets today repeat in 24 hours.

## 2017-04-17 NOTE — Telephone Encounter (Signed)
Prolia today, Next injection Prolia due 10/18/17

## 2017-04-17 NOTE — Patient Instructions (Signed)
Tinidazole tablets What is this medicine? TINIDAZOLE (tye NI da zole) is an antiinfective. It is used to treat amebiasis, giardiasis, trichomoniasis, and vaginosis. It will not work for colds, flu, or other viral infections. This medicine may be used for other purposes; ask your health care provider or pharmacist if you have questions. COMMON BRAND NAME(S): Tindamax What should I tell my health care provider before I take this medicine? They need to know if you have any of these conditions: -anemia or other blood disorders -if you frequently drink alcohol containing drinks -receiving hemodialysis -seizure disorder -an unusual or allergic reaction to tinidazole, other medicines, foods, dyes, or preservatives -pregnant or trying to get pregnant -breast-feeding How should I use this medicine? Take this medicine by mouth with a full glass of water. Follow the directions on the prescription label. Take with food. Take your medicine at regular intervals. Do not take your medicine more often than directed. Take all of your medicine as directed even if you think you are better. Do not skip doses or stop your medicine early. Talk to your pediatrician regarding the use of this medicine in children. While this drug may be prescribed for children as young as 3 years of age for selected conditions, precautions do apply. Overdosage: If you think you have taken too much of this medicine contact a poison control center or emergency room at once. NOTE: This medicine is only for you. Do not share this medicine with others. What if I miss a dose? If you miss a dose, take it as soon as you can. If it is almost time for your next dose, take only that dose. Do not take double or extra doses. What may interact with this medicine? Do not take this medicine with any of the following medications: -alcohol or any product that contains alcohol -amprenavir oral solution -disulfiram -paclitaxel injection -ritonavir  oral solution -sertraline oral solution -sulfamethoxazole-trimethoprim injection This medicine may also interact with the following medications: -cholestyramine -cimetidine -conivaptan -cyclosporin -fluorouracil -fosphenytoin, phenytoin -ketoconazole -lithium -phenobarbital -tacrolimus -warfarin This list may not describe all possible interactions. Give your health care provider a list of all the medicines, herbs, non-prescription drugs, or dietary supplements you use. Also tell them if you smoke, drink alcohol, or use illegal drugs. Some items may interact with your medicine. What should I watch for while using this medicine? Tell your doctor or health care professional if your symptoms do not improve or if they get worse. Avoid alcoholic drinks while you are taking this medicine and for three days afterward. Alcohol may make you feel dizzy, sick, or flushed. If you are being treated for a sexually transmitted disease, avoid sexual contact until you have finished your treatment. Your sexual partner may also need treatment. What side effects may I notice from receiving this medicine? Side effects that you should report to your doctor or health care professional as soon as possible: -allergic reactions like skin rash, itching or hives, swelling of the face, lips, or tongue -breathing problems -confusion, depression -dark or white patches in the mouth -feeling faint or lightheaded, falls -fever, infection -numbness, tingling, pain or weakness in the hands or feet -pain when passing urine -seizures -unusually weak or tired -vaginal irritation or discharge -vomiting Side effects that usually do not require medical attention (report to your doctor or health care professional if they continue or are bothersome): -dark brown or reddish urine -diarrhea -headache -loss of appetite -metallic taste -nausea -stomach upset This list may not describe all  possible side effects. Call your  doctor for medical advice about side effects. You may report side effects to FDA at 1-800-FDA-1088. Where should I keep my medicine? Keep out of the reach of children. Store at room temperature between 15 and 30 degrees C (59 and 86 degrees F). Protect from light and moisture. Keep container tightly closed. Throw away any unused medicine after the expiration date. NOTE: This sheet is a summary. It may not cover all possible information. If you have questions about this medicine, talk to your doctor, pharmacist, or health care provider.  2018 Elsevier/Gold Standard (2008-09-07 15:22:28) Bacterial Vaginosis Bacterial vaginosis is a vaginal infection that occurs when the normal balance of bacteria in the vagina is disrupted. It results from an overgrowth of certain bacteria. This is the most common vaginal infection among women ages 87-44. Because bacterial vaginosis increases your risk for STIs (sexually transmitted infections), getting treated can help reduce your risk for chlamydia, gonorrhea, herpes, and HIV (human immunodeficiency virus). Treatment is also important for preventing complications in pregnant women, because this condition can cause an early (premature) delivery. What are the causes? This condition is caused by an increase in harmful bacteria that are normally present in small amounts in the vagina. However, the reason that the condition develops is not fully understood. What increases the risk? The following factors may make you more likely to develop this condition:  Having a new sexual partner or multiple sexual partners.  Having unprotected sex.  Douching.  Having an intrauterine device (IUD).  Smoking.  Drug and alcohol abuse.  Taking certain antibiotic medicines.  Being pregnant. You cannot get bacterial vaginosis from toilet seats, bedding, swimming pools, or contact with objects around you. What are the signs or symptoms? Symptoms of this condition  include:  Grey or white vaginal discharge. The discharge can also be watery or foamy.  A fish-like odor with discharge, especially after sexual intercourse or during menstruation.  Itching in and around the vagina.  Burning or pain with urination. Some women with bacterial vaginosis have no signs or symptoms. How is this diagnosed? This condition is diagnosed based on:  Your medical history.  A physical exam of the vagina.  Testing a sample of vaginal fluid under a microscope to look for a large amount of bad bacteria or abnormal cells. Your health care provider may use a cotton swab or a small wooden spatula to collect the sample. How is this treated? This condition is treated with antibiotics. These may be given as a pill, a vaginal cream, or a medicine that is put into the vagina (suppository). If the condition comes back after treatment, a second round of antibiotics may be needed. Follow these instructions at home: Medicines   Take over-the-counter and prescription medicines only as told by your health care provider.  Take or use your antibiotic as told by your health care provider. Do not stop taking or using the antibiotic even if you start to feel better. General instructions   If you have a female sexual partner, tell her that you have a vaginal infection. She should see her health care provider and be treated if she has symptoms. If you have a female sexual partner, he does not need treatment.  During treatment:  Avoid sexual activity until you finish treatment.  Do not douche.  Avoid alcohol as directed by your health care provider.  Avoid breastfeeding as directed by your health care provider.  Drink enough water and fluids to keep your  urine clear or pale yellow.  Keep the area around your vagina and rectum clean.  Wash the area daily with warm water.  Wipe yourself from front to back after using the toilet.  Keep all follow-up visits as told by your health  care provider. This is important. How is this prevented?  Do not douche.  Wash the outside of your vagina with warm water only.  Use protection when having sex. This includes latex condoms and dental dams.  Limit how many sexual partners you have. To help prevent bacterial vaginosis, it is best to have sex with just one partner (monogamous).  Make sure you and your sexual partner are tested for STIs.  Wear cotton or cotton-lined underwear.  Avoid wearing tight pants and pantyhose, especially during summer.  Limit the amount of alcohol that you drink.  Do not use any products that contain nicotine or tobacco, such as cigarettes and e-cigarettes. If you need help quitting, ask your health care provider.  Do not use illegal drugs. Where to find more information:  Centers for Disease Control and Prevention: AppraiserFraud.fi  American Sexual Health Association (ASHA): www.ashastd.org  U.S. Department of Health and Financial controller, Office on Women's Health: DustingSprays.pl or SecuritiesCard.it Contact a health care provider if:  Your symptoms do not improve, even after treatment.  You have more discharge or pain when urinating.  You have a fever.  You have pain in your abdomen.  You have pain during sex.  You have vaginal bleeding between periods. Summary  Bacterial vaginosis is a vaginal infection that occurs when the normal balance of bacteria in the vagina is disrupted.  Because bacterial vaginosis increases your risk for STIs (sexually transmitted infections), getting treated can help reduce your risk for chlamydia, gonorrhea, herpes, and HIV (human immunodeficiency virus). Treatment is also important for preventing complications in pregnant women, because the condition can cause an early (premature) delivery.  This condition is treated with antibiotic medicines. These may be given as a pill, a vaginal cream, or a medicine  that is put into the vagina (suppository). This information is not intended to replace advice given to you by your health care provider. Make sure you discuss any questions you have with your health care provider. Document Released: 12/11/2005 Document Revised: 08/26/2016 Document Reviewed: 08/26/2016 Elsevier Interactive Patient Education  2017 [employer].

## 2017-04-18 ENCOUNTER — Encounter: Payer: Medicare Other | Admitting: Physical Therapy

## 2017-04-20 ENCOUNTER — Encounter: Payer: Self-pay | Admitting: Physical Therapy

## 2017-04-20 ENCOUNTER — Ambulatory Visit (INDEPENDENT_AMBULATORY_CARE_PROVIDER_SITE_OTHER): Payer: Medicare Other | Admitting: Physical Therapy

## 2017-04-20 DIAGNOSIS — M62838 Other muscle spasm: Secondary | ICD-10-CM

## 2017-04-20 DIAGNOSIS — M6281 Muscle weakness (generalized): Secondary | ICD-10-CM | POA: Diagnosis not present

## 2017-04-20 DIAGNOSIS — M542 Cervicalgia: Secondary | ICD-10-CM

## 2017-04-20 NOTE — Therapy (Addendum)
[place], [place], [address] Phone: [phone]   Fax:  [phone]  Physical Therapy Treatment  Patient Details  Name: [name] MRN: [id number] Date of Birth: 1946/06/05 Referring Provider: Dr Milta Deiters  Encounter Date: 04/20/2017      PT End of Session - 04/20/17 1018    Visit Number 7   Number of Visits 12   Date for PT Re-Evaluation 04/25/17   PT Start Time 1018   PT Stop Time 1117   PT Time Calculation (min) 59 min   Activity Tolerance Patient tolerated treatment well      Past Medical History:  Diagnosis Date  . Arthritis   . Asthma   . CAD (coronary artery disease) CARDIOLOGIST--  DR Angelena Form   mild non-obstructive cad  . Chronic constipation   . COPD (chronic obstructive pulmonary disease) ([place])   . Fibromyalgia   . Frequency of urination   . GERD (gastroesophageal reflux disease)   . H/O hiatal hernia   . History of basal cell carcinoma excision    X2  . History of breast cancer ONCOLOGIST-- DR Jana Hakim---  NO RECURRANCE   DX 07/2012;  LOW GRADE DCIS  ER+PR+  ----  S/P RIGHT LUMPECTOMY WITH NEGATIVE MARGINS/   RADIATION ENDED 11/2012  . History of chronic bronchitis   . History of colonic polyps   . History of tachycardia    CONTROLLED  WITH ATENOLOL  . Hypertension   . Osteoporosis   . Pelvic pain   . S/P radiation therapy 11/12/12 - 12/05/12   right Breast  . Sinus headache   . Urgency of urination     Past Surgical History:  Procedure Laterality Date  . [place] STUDY N/A 04/03/2016   Procedure: [place] STUDY;  Surgeon: Manus Gunning, MD;  Location: [facility];  Service: Gastroenterology;  Laterality: N/A;  . BREAST LUMPECTOMY Right 10-11-2012   W/ SLN BX  . CARDIAC CATHETERIZATION  09-13-2007  DR Lia Foyer   WELL-PRESERVED LVF/  DIFFUSE SCATTERED CORONARY CALCIFACATION AND ATHEROSCLEROSIS WITHOUT OBSTRUCTION  . CARDIAC CATHETERIZATION  08-04-2010  DR  Angelena Form   NON-OBSTRUCTIVE CAD/  pLAD 40%/  oLAD 30%/  mLAD 30%/  pRCA 30%/  EF 60%  . CARDIOVASCULAR STRESS TEST  06-18-2012  DR McALHANY   LOW RISK NUCLEAR STUDY/  SMALL FIXED AREA OF MODERATELY DECREASED UPTAKE IN ANTEROSEPTAL WALL WHICH MAY BE ARTIFACTUAL/  NO ISCHEMIA/  EF 68%  . COLONOSCOPY  09-29-2010  . CYSTOSCOPY WITH HYDRODISTENSION AND BIOPSY N/A 03/06/2014   Procedure: CYSTOSCOPY/HYDRODISTENSION/ INSTILATION OF MARCAINE AND PYRIDIUM;  Surgeon: Ailene Rud, MD;  Location: [facility];  Service: Urology;  Laterality: N/A;  . ESOPHAGEAL MANOMETRY N/A 04/03/2016   Procedure: ESOPHAGEAL MANOMETRY (EM);  Surgeon: Manus Gunning, MD;  Location: [facility];  Service: Gastroenterology;  Laterality: N/A;  . NASAL SINUS SURGERY  1985  . ORIF RIGHT ANKLE  FX  2006  . REMOVAL VOCAL CORD CYST  FEB 2014  . RIGHT BREAST BX  08-23-2012  . RIGHT HAND SURGERY  X3  LAST ONE 2009   INCLUDES  ORIF RIGHT 5TH FINGER AND REVISION TWICE  . TONSILLECTOMY AND ADENOIDECTOMY  AGE 33  . TOTAL ABDOMINAL HYSTERECTOMY W/ BILATERAL SALPINGOOPHORECTOMY  1982   W/  APPENDECTOMY  . TRANSTHORACIC ECHOCARDIOGRAM  06-24-2012   GRADE I DIASTOLIC DYSFUNCTION/  EF 55-60%/  MILD MR    There were no vitals filed for this  visit.      Subjective Assessment - 04/20/17 1021    Subjective [name] reports she overdid it yesterday, laundry and ironing, cleaned her cabinets.  Did try to use good posture. However she is sore today.  Her wrist is feeling a little better, not wearing her brace.    Patient Stated Goals wishes to not have to need more pain medications, sleep better at night, be able to do some exercise and have less pain like before   Currently in Pain? Yes   Pain Score 6    Pain Location Neck  and back                         [facility] Adult PT Treatment/Exercise - 04/20/17 0001      Neck Exercises: Machines for Strengthening   UBE (Upper Arm Bike) L2x 4' alt  FWD/BWD     Neck Exercises: Supine   Other Supine Exercise 5 reps AA deep neck flexor exercise, 15 reps head presses with tongue press on roof of mouth.      Modalities   Modalities Electrical Stimulation;Moist Heat     Moist Heat Therapy   Number Minutes Moist Heat 15 Minutes   Moist Heat Location Cervical;Lumbar Spine     Electrical Stimulation   Electrical Stimulation Location thoracic, lower cervical   Electrical Stimulation Action IFC   Electrical Stimulation Parameters  to tolerance   Electrical Stimulation Goals Pain     Neck Exercises: Stretches   Other Neck Stretches thoracic and chest stretches supine on pool noodle.                 PT Education - 04/20/17 1037    Education provided Yes   Education Details thoracic mobilizations using a pool noodle in supine   Person(s) Educated Patient   Methods Explanation;Handout   Comprehension Returned demonstration;Verbalized understanding             PT Long Term Goals - 04/12/17 1407      PT LONG TERM GOAL #1   Title independent with HEP (04/25/17)   Status On-going  been a little limited with her band exercises d/t wrist pain     PT LONG TERM GOAL #2   Title improve cervical ROM bilat rotation =/> 60 degrees ( 04/25/17)    Status Achieved     PT LONG TERM GOAL #3   Title report 75% improvement in symtoms (04/25/17)   Status On-going     PT LONG TERM GOAL #4   Title improve FOTO =/< 53% limited, CK level ( 04/25/17)    Status On-going     PT LONG TERM GOAL #5   Title demo UE strength =/> 5-/5 ( 04/25/17)    Status Partially Met               Plan - 04/20/17 1059    Clinical Impression Statement [name] had some good releases using the pool noodle.  She did have a small muscle spasm in her thoracic region getting off it.  It settled down with head and stim. Her over all back and neck pain decreased significantly after treatment.    Rehab Potential Good   PT Frequency 2x / week   PT Duration 6 weeks    PT Treatment/Interventions Moist Heat;Ultrasound;Therapeutic exercise;Dry needling;Taping;Manual techniques;Neuromuscular re-education;Cryotherapy;Electrical Stimulation;Patient/family education;Passive range of motion   PT Next Visit Plan spinal stabilization ex   Consulted and Agree with Plan of Care Patient  Patient will benefit from skilled therapeutic intervention in order to improve the following deficits and impairments:  Decreased range of motion, Dizziness, Increased muscle spasms, Impaired UE functional use, Pain, Decreased strength  Visit Diagnosis: Muscle weakness (generalized)  Cervicalgia  Other muscle spasm     Problem List Patient Active Problem List   Diagnosis Date Noted  . Excessive postexertional fatigue 04/02/2017  . Pneumonia due to virus 04/02/2017  . Chronic interstitial cystitis 02/01/2017  . Acute pharyngitis 01/02/2017  . Dysphagia   . Esophageal reflux   . Allergic rhinitis 01/25/2016  . Shortness of breath   . Left hip pain 04/30/2015  . Sciatica 04/30/2015  . Knee pain, right 04/30/2015  . Leukocytes in urine 04/30/2015  . Hyperlipidemia 03/01/2015  . Bilateral hand numbness 03/01/2015  . Pulmonary nodules 02/12/2015  . External hemorrhoids 02/05/2015  . Vaginitis and vulvovaginitis 02/05/2015  . Chronic night sweats 01/08/2015  . Atypical chest pain 01/01/2015  . Chest pain 01/01/2015  . Renal insufficiency 07/16/2014  . Sepsis secondary to UTI ([place]) 02/05/2014  . Grief reaction 12/29/2013  . Urinary hesitancy 12/04/2013  . Abdominal pain, other specified site 10/26/2013  . Headache(784.0) 10/13/2013  . Pain, joint, multiple sites 08/18/2013  . Vasculitis ([place]) 07/29/2013  . ANA positive 07/29/2013  . Other and unspecified hyperlipidemia 07/12/2013  . Contusion, chest wall 04/30/2013  . Malignant neoplasm of lower-outer quadrant of female breast ([place]) 01/03/2013  . Breast cancer, right breast ([place]) 10/17/2012  . Splenic  lesion 09/02/2012  . Asthma, allergic 08/05/2012  . GERD (gastroesophageal reflux disease) 06/03/2012  . CAD (coronary artery disease) 06/03/2012  . Former heavy tobacco smoker 06/03/2012  . Edema 04/08/2012  . Stricture and stenosis of esophagus 10/19/2011  . Constipation - functional 07/21/2011  . Nonspecific (abnormal) findings on radiological and other examination of biliary tract 07/21/2011  . Osteoporosis 04/17/2011  . POSTMENOPAUSAL STATUS 01/27/2011  . CONSTIPATION 11/02/2010  . FIBROCYSTIC BREAST DISEASE, HX OF 10/18/2010  . CAD, NATIVE VESSEL 08/31/2010  . Essential hypertension 08/25/2010  . CAROTID ARTERY DISEASE 08/25/2010  . TOBACCO ABUSE, HX OF 08/25/2010  . GENERALIZED ANXIETY DISORDER 08/03/2010  . FIBROMYALGIA 01/11/2010  . SKIN CANCER, HX OF 01/11/2010    Jeral Pinch PT  04/20/2017, 11:03 AM  [facility] [place], [place], [address] Phone: [phone]   Fax:  [phone]  Name: Cole Klugh MRN: [id number] Date of Birth: Aug 30, 1946   PHYSICAL THERAPY DISCHARGE SUMMARY  Visits from Start of Care: 7  Current functional level related to goals / functional outcomes: unknown   Remaining deficits: unknown   Education / Equipment: HEP Plan:                                                    Patient goals were partially met. Patient is being discharged due to                                                     ?????Pt has not been seen, initially therapist was out and then patient was out of town.     Jeral Pinch, PT 06/01/17 8:54 AM

## 2017-04-20 NOTE — Patient Instructions (Addendum)
Practice walking backwards.  Can do next to a counter or wall for safety/security.    Thoracic Self-Mobilization Stretch (Supine) -use a pillow under head for support.  move noodle up spine for more stretching into the chest. Can press elbows down towards the floor.      ALways roll to the side to get off the noodle. With small pool noodle at lower ribs level, gently lie back until stretch is felt. Hold _60-120___ seconds. Relax. Repeat __1__ times per set. Do __1__ sets per session. Do __every other day or 1__ sessions per day.   Thoracic Self-Mobilization (Supine)    With pool noodle placed lengthwise at lower ribs level, lie back on towel with arms outstretched. Hold __60-120__ seconds. Relax. Repeat __1__ times per set. Do __1__ sets per session. Do _every other day or 1___ sessions per day.

## 2017-04-22 ENCOUNTER — Ambulatory Visit (INDEPENDENT_AMBULATORY_CARE_PROVIDER_SITE_OTHER): Payer: Medicare Other | Admitting: Neurology

## 2017-04-22 DIAGNOSIS — G4733 Obstructive sleep apnea (adult) (pediatric): Secondary | ICD-10-CM

## 2017-04-22 DIAGNOSIS — J129 Viral pneumonia, unspecified: Secondary | ICD-10-CM

## 2017-04-22 DIAGNOSIS — J449 Chronic obstructive pulmonary disease, unspecified: Principal | ICD-10-CM

## 2017-04-25 ENCOUNTER — Encounter: Payer: Medicare Other | Admitting: Rehabilitative and Restorative Service Providers"

## 2017-04-25 DIAGNOSIS — I10 Essential (primary) hypertension: Secondary | ICD-10-CM | POA: Diagnosis not present

## 2017-04-25 DIAGNOSIS — H16143 Punctate keratitis, bilateral: Secondary | ICD-10-CM | POA: Diagnosis not present

## 2017-04-25 DIAGNOSIS — Z961 Presence of intraocular lens: Secondary | ICD-10-CM | POA: Diagnosis not present

## 2017-04-25 DIAGNOSIS — H16223 Keratoconjunctivitis sicca, not specified as Sjogren's, bilateral: Secondary | ICD-10-CM | POA: Diagnosis not present

## 2017-05-01 ENCOUNTER — Encounter: Payer: Medicare Other | Admitting: Physical Therapy

## 2017-05-04 ENCOUNTER — Encounter: Payer: Medicare Other | Admitting: Rehabilitative and Restorative Service Providers"

## 2017-05-04 NOTE — Procedures (Signed)
PATIENT'S NAME:  [name], [name] DOB:      12/28/1945      MR#:    [id number]     DATE OF RECORDING: 04/22/2017 REFERRING M.D.:  Sarina Ill, MD , PCP: Jani Gravel, MD Study Performed:   Baseline Polysomnogram HISTORY:  [name] reports morning headaches, wakes up with a dry mouth and her husband has noted her to snore heavily. Sometimes she wakes herself up snoring, choking or gasping. Over the last couple of months she has several times woken up feeling she could not breathe and needed to sit up to catch her next breath. These episodes have been very scary.  Arthritis, asthma, CAD, constipation, COPD, fibromyalgia, GERD, hypertension, osteoporosis and headaches  The patient endorsed the Epworth Sleepiness Scale at 8/24 points.  The patient's weight 115 pounds with a height of 64 (inches), resulting in a BMI of 20.1 kg/m2.The patient's neck circumference measured 14 inches.  CURRENT MEDICATIONS: see visit note   PROCEDURE:  This is a multichannel digital polysomnogram utilizing the Somnostar 11.2 system.  Electrodes and sensors were applied and monitored per AASM Specifications.   EEG, EOG, Chin and Limb EMG, were sampled at 200 Hz.  ECG, Snore and Nasal Pressure, Thermal Airflow, Respiratory Effort, CPAP Flow and Pressure, Oximetry was sampled at 50 Hz. Digital video and audio were recorded.      BASELINE STUDY  Lights Out was at 21:05 and Lights On at 05:01.  Total recording time (TRT) was 476.5 minutes, with a total sleep time (TST) of  409.5 minutes.   The patient's sleep latency was 6.1 minutes.  The sleep efficiency was 85.9 %.    SLEEP ARCHITECTURE: WASO (Wake after sleep onset) was 61 minutes.  There were 25.5 minutes in Stage N1, 384 minutes Stage N2, 0 minutes Stage N3 and 0 minutes in Stage REM.   The percentage of Stage N1 was 6.2%, Stage N2 was 93.8%, Stage N3 0% and Stage R (REM sleep) 0%.   RESPIRATORY ANALYSIS:  There were a total of 35 respiratory events:  2 obstructive apneas,  0 central apneas and 0 mixed apneas with a total of 2 apneas and an apnea index (AI) of .3 /hour. There were 33 hypopneas with a hypopnea index of 4.8 /hour. The patient also had 0 respiratory event related arousals (RERAs).      The total APNEA/HYPOPNEA INDEX (AHI) was 5.1/hour and the total RESPIRATORY DISTURBANCE INDEX was 5.1 /hour.  0 events occurred in REM sleep and 66 events in NREM. The REM AHI was 0 /hour, versus a non-REM AHI of 5.1. The patient spent 58.5 minutes of total sleep time in the supine position and 351 minutes in non-supine.. The supine AHI was 16.5 versus a non-supine AHI of 3.2.  OXYGEN SATURATION & C02:  The Wake baseline 02 saturation was 98%, with the lowest being 89%. Time spent below 89% saturation equaled 0 minutes.  Average End [place]idal CO2 during sleep was 33.2 torr.  During NREM, the average End Tidal CO2 during sleep was 33.2 torr.  Total sleep time greater than 40 torr was 0.0  minutes.   PERIODIC LIMB MOVEMENTS:   The patient had a total of 5 Periodic Limb Movements.  The Periodic Limb Movement (PLM) index was 0.7 and the PLM Arousal index was 0/hour. The arousals were noted as: 48 were spontaneous, 0 were associated with PLMs, and 35 were associated with respiratory events.  Audio and video analysis did not show any abnormal or unusual movements,  behaviors, phonations or vocalizations.  The patient took 0 bathroom breaks .Mild Snoring was noted, mostly while supine. EKG was in keeping with normal sinus rhythm (NSR). Post-study, the patient indicated that she woke up with headaches.    IMPRESSION: Obstructive Sleep Apnea(OSA). Very mild and accentuated by supine sleep to AHI 16.5.  1. No clinically significant hypoxia 2. Mild Snoring 3. Repetitive Intrusions of Sleep were unrelated to physiological arousals. The patient reported being in pain.  4. Sleep architecture was void of REM sleep ( Medication related? )  RECOMMENDATIONS:  1. Advise to avoid supine  sleep,  2. Central apneas , which manifest as gasping, can be related to opioid /narcotic pain medication. 3. Improve sleep hygiene at home- Bedroom should be cool, quiet and dark. Remove TV and blue light sources.  4. ENT examination or dental device may reduce the already mild apnea and snoring.   5. Avoid narcotics and caffeine-containing beverages and chocolate. 6. Consider dedicated sleep psychology referral for insomnia.   7. A follow up appointment in the Sleep Clinic at [facility] is optional; visit can be with NP or primary neurologist. Goal is the introduction of tennis ball method to avoid supine sleep position as well as consideration for pain a management referral. The referring provider will be notified of the results.      I certify that I have reviewed the entire raw data recording prior to the issuance of this report in accordance with the Standards of Accreditation of the American Academy of Sleep Medicine (AASM)    Larey Seat, MD  05-04-2017  Diplomat, American Board of Psychiatry and Neurology  Diplomat, American Board of [place] Director, [employer] [facility] [employer]

## 2017-05-09 ENCOUNTER — Encounter: Payer: Self-pay | Admitting: Gynecology

## 2017-05-10 ENCOUNTER — Telehealth: Payer: Self-pay

## 2017-05-10 NOTE — Telephone Encounter (Signed)
-----   Message from Larey Seat, MD sent at 05/04/2017 11:23 AM EDT -----   IMPRESSION: Obstructive Sleep Apnea(OSA).but very mild ( AHI was 5.1) and accentuated by supine sleep to AHI 16.5/hr..  1. No clinically significant hypoxia 2. Mild Snoring 3. Repetitive Intrusions of Sleep were unrelated to physiological arousals. The patient reported being in pain.  4. Sleep architecture was void of REM sleep (Medication related?)  RECOMMENDATIONS:  1. Advise to avoid supine sleep,  2. Central apneas, which manifest as gasping ,can be related to opioid /narcotic pain medication. 3. Improve sleep hygiene at home- Bedroom should be cool, quiet and dark. Remove TV and blue light sources.  4. ENT examination or dental device may reduce the already mild apnea and snoring.   5. Avoid narcotics and caffeine-containing beverages and chocolate. 6. Consider dedicated sleep psychology referral for insomnia.   7. A follow up appointment in the Sleep Clinic at [facility] is optional; visit can be with NP or primary neurologist. Goal is the introduction of tennis ball method to avoid supine sleep position as well as consideration for pain a management referral. The referring provider will be notified of the results.   Cc Dr Jaynee Eagles, Dr.Kim

## 2017-05-10 NOTE — Telephone Encounter (Signed)
I called pt. I advised pt that Dr. Brett Fairy reviewed pt's sleep study and found that pt had mild osa worse during supine sleep, no clinically significant hypoxia, mild snoring. Dr. Brett Fairy recommends that pt see a pain specialist because pt did awaken because of pain. Dr. Brett Fairy also recommends that pt avoid supine sleep, avoid narcotics, caffeine containing beverages, and chocolate and perhaps consider at ENT exam or dental device to reduce snoring and mild apnea. I reviewed sleep hygiene recommendations with the pt, including trying to keep a regular sleep wake schedule, avoiding electronics in the bedroom, keeping the bedroom cool, dark, and quiet, and avoiding eating or exercising within 2 hours of bedtime as well as eating in the middle of the night. I advised pt that a copy of these sleep study results will be sent to Dr. Jaynee Eagles. Pt says that she already saw the results on mychart. Pt declined a follow up with Dr.Dohmeier, and an ENT/dental device. Pt verbalized understanding of results. Pt had no questions at this time but was encouraged to call back if questions arise.

## 2017-05-25 ENCOUNTER — Ambulatory Visit: Payer: Medicare Other | Admitting: Gynecology

## 2017-05-29 ENCOUNTER — Ambulatory Visit (INDEPENDENT_AMBULATORY_CARE_PROVIDER_SITE_OTHER): Payer: Medicare Other | Admitting: Gynecology

## 2017-05-29 ENCOUNTER — Encounter: Payer: Self-pay | Admitting: Gynecology

## 2017-05-29 ENCOUNTER — Encounter: Payer: Self-pay | Admitting: Gastroenterology

## 2017-05-29 VITALS — BP 124/80 | Ht 63.0 in | Wt 114.0 lb

## 2017-05-29 DIAGNOSIS — N898 Other specified noninflammatory disorders of vagina: Secondary | ICD-10-CM

## 2017-05-29 LAB — URINALYSIS W MICROSCOPIC + REFLEX CULTURE
BILIRUBIN URINE: NEGATIVE
Bacteria, UA: NONE SEEN [HPF]
Casts: NONE SEEN [LPF]
GLUCOSE, UA: NEGATIVE
HGB URINE DIPSTICK: NEGATIVE
KETONES UR: NEGATIVE
LEUKOCYTES UA: NEGATIVE
NITRITE: NEGATIVE
PH: 6 (ref 5.0–8.0)
Protein, ur: NEGATIVE
RBC / HPF: NONE SEEN RBC/HPF (ref <=2)
Specific Gravity, Urine: 1.02 (ref 1.001–1.035)
WBC UA: NONE SEEN WBC/HPF (ref <=5)
Yeast: NONE SEEN [HPF]

## 2017-05-29 LAB — WET PREP FOR TRICH, YEAST, CLUE
Trich, Wet Prep: NONE SEEN
WBC, Wet Prep HPF POC: NONE SEEN
Yeast Wet Prep HPF POC: NONE SEEN

## 2017-05-29 MED ORDER — METRONIDAZOLE 0.75 % VA GEL: 1.0000 | Freq: Two times a day (BID) | VAGINAL | 0 refills | Status: DC

## 2017-05-29 NOTE — Telephone Encounter (Signed)
Error

## 2017-05-29 NOTE — Patient Instructions (Addendum)
Bacterial Vaginosis Bacterial vaginosis is a vaginal infection that occurs when the normal balance of bacteria in the vagina is disrupted. It results from an overgrowth of certain bacteria. This is the most common vaginal infection among women ages 15-44. Because bacterial vaginosis increases your risk for STIs (sexually transmitted infections), getting treated can help reduce your risk for chlamydia, gonorrhea, herpes, and HIV (human immunodeficiency virus). Treatment is also important for preventing complications in pregnant women, because this condition can cause an early (premature) delivery. What are the causes? This condition is caused by an increase in harmful bacteria that are normally present in small amounts in the vagina. However, the reason that the condition develops is not fully understood. What increases the risk? The following factors may make you more likely to develop this condition:  Having a new sexual partner or multiple sexual partners.  Having unprotected sex.  Douching.  Having an intrauterine device (IUD).  Smoking.  Drug and alcohol abuse.  Taking certain antibiotic medicines.  Being pregnant.  You cannot get bacterial vaginosis from toilet seats, bedding, swimming pools, or contact with objects around you. What are the signs or symptoms? Symptoms of this condition include:  Grey or white vaginal discharge. The discharge can also be watery or foamy.  A fish-like odor with discharge, especially after sexual intercourse or during menstruation.  Itching in and around the vagina.  Burning or pain with urination.  Some women with bacterial vaginosis have no signs or symptoms. How is this diagnosed? This condition is diagnosed based on:  Your medical history.  A physical exam of the vagina.  Testing a sample of vaginal fluid under a microscope to look for a large amount of bad bacteria or abnormal cells. Your health care provider may use a cotton swab  or a small wooden spatula to collect the sample.  How is this treated? This condition is treated with antibiotics. These may be given as a pill, a vaginal cream, or a medicine that is put into the vagina (suppository). If the condition comes back after treatment, a second round of antibiotics may be needed. Follow these instructions at home: Medicines  Take over-the-counter and prescription medicines only as told by your health care provider.  Take or use your antibiotic as told by your health care provider. Do not stop taking or using the antibiotic even if you start to feel better. General instructions  If you have a female sexual partner, tell her that you have a vaginal infection. She should see her health care provider and be treated if she has symptoms. If you have a female sexual partner, he does not need treatment.  During treatment: ? Avoid sexual activity until you finish treatment. ? Do not douche. ? Avoid alcohol as directed by your health care provider. ? Avoid breastfeeding as directed by your health care provider.  Drink enough water and fluids to keep your urine clear or pale yellow.  Keep the area around your vagina and rectum clean. ? Wash the area daily with warm water. ? Wipe yourself from front to back after using the toilet.  Keep all follow-up visits as told by your health care provider. This is important. How is this prevented?  Do not douche.  Wash the outside of your vagina with warm water only.  Use protection when having sex. This includes latex condoms and dental dams.  Limit how many sexual partners you have. To help prevent bacterial vaginosis, it is best to have sex with just   one partner (monogamous).  Make sure you and your sexual partner are tested for STIs.  Wear cotton or cotton-lined underwear.  Avoid wearing tight pants and pantyhose, especially during summer.  Limit the amount of alcohol that you drink.  Do not use any products that  contain nicotine or tobacco, such as cigarettes and e-cigarettes. If you need help quitting, ask your health care provider.  Do not use illegal drugs. Where to find more information:  Centers for Disease Control and Prevention: AppraiserFraud.fi  American Sexual Health Association (ASHA): www.ashastd.org  [place] Department of Health and Financial controller, Office on Women's Health: DustingSprays.pl or SecuritiesCard.it Contact a health care provider if:  Your symptoms do not improve, even after treatment.  You have more discharge or pain when urinating.  You have a fever.  You have pain in your abdomen.  You have pain during sex.  You have vaginal bleeding between periods. Summary  Bacterial vaginosis is a vaginal infection that occurs when the normal balance of bacteria in the vagina is disrupted.  Because bacterial vaginosis increases your risk for STIs (sexually transmitted infections), getting treated can help reduce your risk for chlamydia, gonorrhea, herpes, and HIV (human immunodeficiency virus). Treatment is also important for preventing complications in pregnant women, because the condition can cause an early (premature) delivery.  This condition is treated with antibiotic medicines. These may be given as a pill, a vaginal cream, or a medicine that is put into the vagina (suppository). This information is not intended to replace advice given to you by your health care provider. Make sure you discuss any questions you have with your health care provider. Document Released: 12/11/2005 Document Revised: 08/26/2016 Document Reviewed: 08/26/2016 Elsevier Interactive Patient Education  2017 [employer] Metronidazole vaginal gel What is this medicine? METRONIDAZOLE (me troe NI da zole) VAGINAL GEL is an antiinfective. It is used to treat bacterial vaginitis. This medicine may be used for other purposes; ask your health care provider or  pharmacist if you have questions. COMMON BRAND NAME(S): MetroGel, MetroGel Vaginal, MetroGel-Vaginal, NUVESSA, Vandazole What should I tell my health care provider before I take this medicine? They need to know if you have any of these conditions: -if you drink alcohol containing drinks -if you have taken disulfiram in the past two weeks -liver disease -peripheral neuropathy -seizures -an unusual or allergic reaction to metronidazole, parabens, nitroimidazoles, or other medicines, foods, dyes, or preservatives -pregnant or trying to get pregnant -breast-feeding How should I use this medicine? This medicine is only for use in the vagina. Do not take by mouth or apply to other areas of the body. Follow the directions on the prescription label. Wash hands before and after use. Screw the applicator to the tube and squeeze the tube gently to fill the applicator. Lie on your back, part and bend your knees. Insert the applicator tip high in the vagina and push the plunger to release the gel into the vagina. Gently remove the applicator. Wash the applicator well with warm water and soap. Use at regular intervals. Finish the full course prescribed by your doctor or health care professional even if you think your condition is better. Do not stop using except on the advice of your doctor or health care professional. Talk to your pediatrician regarding the use of this medicine in children. Special care may be needed. Overdosage: If you think you have taken too much of this medicine contact a poison control center or emergency room at once. NOTE: This medicine is  only for you. Do not share this medicine with others. What if I miss a dose? If you miss a dose, use it as soon as you can. If it is almost time for your next dose, use only that dose. Do not use double or extra doses. What may interact with this medicine? Do not take this medicine with any of the following medications: -alcohol or any product that  contains alcohol -cisapride -dofetilide -dronedarone -pimozide -thioridazine -ziprasidone This medicine may also interact with the following medications: -cimetidine -lithium -other medicines that prolong the QT interval (cause an abnormal heart rhythm) -warfarin This list may not describe all possible interactions. Give your health care provider a list of all the medicines, herbs, non-prescription drugs, or dietary supplements you use. Also tell them if you smoke, drink alcohol, or use illegal drugs. Some items may interact with your medicine. What should I watch for while using this medicine? Tell your doctor or health care professional if your symptoms do not start to get better in 2 or 3 days. Avoid alcoholic drinks while you are taking this medicine and for three days afterwards. Alcohol may make you feel dizzy, sick, or flushed. You may get drowsy or dizzy. Do not drive, use machinery, or do anything that needs mental alertness until you know how this medicine affects you. To reduce the risk of dizzy or fainting spells, do not sit or stand up quickly, especially if you are an older patient. Your clothing may get soiled if you have a vaginal discharge. You can wear a sanitary napkin. Do not use tampons. Wear freshly washed cotton, not synthetic, panties. Do not have sex until you have finished your treatment. Having sex can make the treatment less effective. Your sexual partner may also need treatment. What side effects may I notice from receiving this medicine? Side effects that you should report to your doctor or health care professional as soon as possible: -dizziness -frequent passing of urine -headache -loss of appetite -nausea -skin rash, itching -stomach pain or cramps -vaginal irritation or discharge -vulvar burning or swelling Side effects that usually do not require medical attention (report to your doctor or health care professional if they continue or are  bothersome): -dark urine -mild vaginal burning This list may not describe all possible side effects. Call your doctor for medical advice about side effects. You may report side effects to FDA at 1-800-FDA-1088. Where should I keep my medicine? Keep out of the reach of children. Store at room temperature between 15 and 30 degrees C (59 and 86 degrees F). Do not freeze. Throw away any unused medicine after the expiration date. NOTE: This sheet is a summary. It may not cover all possible information. If you have questions about this medicine, talk to your doctor, pharmacist, or health care provider.  2018 Elsevier/Gold Standard (2013-07-18 14:09:23) Metronidazole skin gel, cream, or lotion What is this medicine? METRONIDAZOLE (me troe NI da zole) is an antiinfective. This medicine is used to treat rosacea, also known as adult acne. It reduces redness and inflammation and the number of pimples. This medicine may be used for other purposes; ask your health care provider or pharmacist if you have questions. COMMON BRAND NAME(S): MetroCream, MetroGel, MetroLotion, Noritate, Nydamax, Rosadan, Vitazol What should I tell my health care provider before I take this medicine? They need to know if you have any of these conditions: -anemia or other blood disorders -if you drink alcohol containing drinks -other chronic illness -an unusual or allergic reaction  to metronidazole, or other medicines, foods, dyes, or preservatives -pregnant or trying to get pregnant -breast-feeding How should I use this medicine? This medicine is for external use only. Follow the directions on the prescription label. Wash hands before and after application. Wash the affected area with mild soap and water. Pat dry. Apply a small amount of this medicine to the affected area and rub gently. Do not get this medicine in your eyes. If you do, rinse out with plenty of cool tap water. Use this medicine at regular intervals. Do not use  more often than directed. Finish the full course prescribed by your doctor or health care professional even if you think you are better. Do not stop using except on your doctor's advice. Talk to your pediatrician regarding the use of this medicine in children. Special care may be needed. Overdosage: If you think you have taken too much of this medicine contact a poison control center or emergency room at once. NOTE: This medicine is only for you. Do not share this medicine with others. What if I miss a dose? If you miss a dose, use it as soon as you can. If it is almost time for your next dose, use only that dose. Do not use double or extra doses. What may interact with this medicine? -alcohol or any product that contains alcohol -warfarin This list may not describe all possible interactions. Give your health care provider a list of all the medicines, herbs, non-prescription drugs, or dietary supplements you use. Also tell them if you smoke, drink alcohol, or use illegal drugs. Some items may interact with your medicine. What should I watch for while using this medicine? Tell your doctor or health care professional if your skin does not improve in 3 weeks. Further healing can take up to 12 weeks. You can use makeup while using this medicine. Only use water based products and apply lightly. You may also be able to use sunscreens or moisturizers, ask your doctor or health care professional. Wait at least 5 minutes after applying this medicine before applying any of these additional products. What side effects may I notice from receiving this medicine? Side effects that you should report to your doctor or health care professional as soon as possible: -if a new skin condition or rash develops or if rosacea gets worse -loss of feeling or tingling of treated area -nausea Side effects that usually do not require medical attention (report to your doctor or health care professional if they continue or are  bothersome): -burning, itching, or redness -dry skin -watering eyes This list may not describe all possible side effects. Call your doctor for medical advice about side effects. You may report side effects to FDA at 1-800-FDA-1088. Where should I keep my medicine? Keep out of the reach of children. Store at room temperature between 20 and 25 degrees C (68 and 77 degrees F). Do not freeze. Throw away any unused medicine after the expiration date. NOTE: This sheet is a summary. It may not cover all possible information. If you have questions about this medicine, talk to your doctor, pharmacist, or health care provider.  2018 Elsevier/Gold Standard (2016-01-12 19:47:22)  Bacterial Vaginosis Bacterial vaginosis is a vaginal infection that occurs when the normal balance of bacteria in the vagina is disrupted. It results from an overgrowth of certain bacteria. This is the most common vaginal infection among women ages 50-44. Because bacterial vaginosis increases your risk for STIs (sexually transmitted infections), getting treated can help  reduce your risk for chlamydia, gonorrhea, herpes, and HIV (human immunodeficiency virus). Treatment is also important for preventing complications in pregnant women, because this condition can cause an early (premature) delivery. What are the causes? This condition is caused by an increase in harmful bacteria that are normally present in small amounts in the vagina. However, the reason that the condition develops is not fully understood. What increases the risk? The following factors may make you more likely to develop this condition:  Having a new sexual partner or multiple sexual partners.  Having unprotected sex.  Douching.  Having an intrauterine device (IUD).  Smoking.  Drug and alcohol abuse.  Taking certain antibiotic medicines.  Being pregnant.  You cannot get bacterial vaginosis from toilet seats, bedding, swimming pools, or contact with  objects around you. What are the signs or symptoms? Symptoms of this condition include:  Grey or white vaginal discharge. The discharge can also be watery or foamy.  A fish-like odor with discharge, especially after sexual intercourse or during menstruation.  Itching in and around the vagina.  Burning or pain with urination.  Some women with bacterial vaginosis have no signs or symptoms. How is this diagnosed? This condition is diagnosed based on:  Your medical history.  A physical exam of the vagina.  Testing a sample of vaginal fluid under a microscope to look for a large amount of bad bacteria or abnormal cells. Your health care provider may use a cotton swab or a small wooden spatula to collect the sample.  How is this treated? This condition is treated with antibiotics. These may be given as a pill, a vaginal cream, or a medicine that is put into the vagina (suppository). If the condition comes back after treatment, a second round of antibiotics may be needed. Follow these instructions at home: Medicines  Take over-the-counter and prescription medicines only as told by your health care provider.  Take or use your antibiotic as told by your health care provider. Do not stop taking or using the antibiotic even if you start to feel better. General instructions  If you have a female sexual partner, tell her that you have a vaginal infection. She should see her health care provider and be treated if she has symptoms. If you have a female sexual partner, he does not need treatment.  During treatment: ? Avoid sexual activity until you finish treatment. ? Do not douche. ? Avoid alcohol as directed by your health care provider. ? Avoid breastfeeding as directed by your health care provider.  Drink enough water and fluids to keep your urine clear or pale yellow.  Keep the area around your vagina and rectum clean. ? Wash the area daily with warm water. ? Wipe yourself from front  to back after using the toilet.  Keep all follow-up visits as told by your health care provider. This is important. How is this prevented?  Do not douche.  Wash the outside of your vagina with warm water only.  Use protection when having sex. This includes latex condoms and dental dams.  Limit how many sexual partners you have. To help prevent bacterial vaginosis, it is best to have sex with just one partner (monogamous).  Make sure you and your sexual partner are tested for STIs.  Wear cotton or cotton-lined underwear.  Avoid wearing tight pants and pantyhose, especially during summer.  Limit the amount of alcohol that you drink.  Do not use any products that contain nicotine or tobacco, such as cigarettes  and e-cigarettes. If you need help quitting, ask your health care provider.  Do not use illegal drugs. Where to find more information:  Centers for Disease Control and Prevention: AppraiserFraud.fi  American Sexual Health Association (ASHA): www.ashastd.org  [place]. Department of Health and Financial controller, Office on Women's Health: DustingSprays.pl or SecuritiesCard.it Contact a health care provider if:  Your symptoms do not improve, even after treatment.  You have more discharge or pain when urinating.  You have a fever.  You have pain in your abdomen.  You have pain during sex.  You have vaginal bleeding between periods. Summary  Bacterial vaginosis is a vaginal infection that occurs when the normal balance of bacteria in the vagina is disrupted.  Because bacterial vaginosis increases your risk for STIs (sexually transmitted infections), getting treated can help reduce your risk for chlamydia, gonorrhea, herpes, and HIV (human immunodeficiency virus). Treatment is also important for preventing complications in pregnant women, because the condition can cause an early (premature) delivery.  This condition is treated with  antibiotic medicines. These may be given as a pill, a vaginal cream, or a medicine that is put into the vagina (suppository). This information is not intended to replace advice given to you by your health care provider. Make sure you discuss any questions you have with your health care provider. Document Released: 12/11/2005 Document Revised: 08/26/2016 Document Reviewed: 08/26/2016 Elsevier Interactive Patient Education  2017 [employer].

## 2017-05-29 NOTE — Progress Notes (Signed)
   Patient is a 71 year old that presented to the office today with complaining of a slight vaginal discharge and odor. Patient was treated for bacterial vaginosis with Tindamax for 2 days in April of this year. Patient 5 years ago had history of breast cancer and for this reason she is on no hormone replacement therapy and is complaining of vaginal dryness at times which she states causes her to have discomfort and this is why she has not sexually active. Patient also has difficulty anterior bladder at times than his been followed by Dr. Gaynelle Arabian is scheduled to see them this week.  Exam: Pelvic: Bartholin urethra Skene glands with atrophic changes Vagina: Atrophic changes vaginal cuff intact Bimanual exam not done  Wet prep: Few clue cells and many bacteria  Assessment/plan: Patient with recurrent bacterial vaginosis second time in 2 months we'll going to try a vaginal approach with MetroGel to apply twice a day for 7 days. Her urinalysis today only demonstrated calcium oxalate and I've given her copy of the urinalysis to take with her when she sees the urologist tomorrow.

## 2017-05-30 ENCOUNTER — Telehealth: Payer: Self-pay | Admitting: Oncology

## 2017-05-30 DIAGNOSIS — N3010 Interstitial cystitis (chronic) without hematuria: Secondary | ICD-10-CM | POA: Diagnosis not present

## 2017-05-30 DIAGNOSIS — N952 Postmenopausal atrophic vaginitis: Secondary | ICD-10-CM | POA: Diagnosis not present

## 2017-05-30 NOTE — Telephone Encounter (Signed)
sw pt to confirm 6/7 appt to 7/19 per gm out of office

## 2017-05-31 ENCOUNTER — Ambulatory Visit: Payer: Medicare Other | Admitting: Oncology

## 2017-06-04 ENCOUNTER — Telehealth: Payer: Self-pay | Admitting: Emergency Medicine

## 2017-06-04 MED ORDER — AZITHROMYCIN 250 MG PO TABS: ORAL_TABLET | ORAL | 0 refills | Status: AC

## 2017-06-04 MED ORDER — PREDNISONE 10 MG PO TABS: ORAL_TABLET | ORAL | 0 refills | Status: DC

## 2017-06-04 NOTE — Telephone Encounter (Signed)
Ok to treat for an AE:   prednisone Take 40mg  daily for 3 days, then 30mg  daily for 3 days, then 20mg  daily for 3 days, then 10mg  daily for 3 days, then stop  Azithromycin, z pack

## 2017-06-04 NOTE — Telephone Encounter (Signed)
Spoke with pt. States that she is not feeling well. Reports sore throat, PND, cough, chest discomfort, low grade fever, wheezing and SOB. Denies chest tightness. Cough is producing yellow/brown mucus. Symptoms started 5 days ago. Pt is still using Flonase and Claritin with no relief. Has not tried any OTC medications (other than Claritin). Would like RB's recommendations.  RB - please advise. Thanks.

## 2017-06-04 NOTE — Telephone Encounter (Signed)
Spoke with pt. She is aware of RB's recommendation. Rxs have been sent in. Nothing further was needed at this time.

## 2017-06-04 NOTE — Telephone Encounter (Signed)
Patient returned call, CB is [phone].

## 2017-06-04 NOTE — Telephone Encounter (Signed)
lmtcb x1 for pt. 

## 2017-06-22 DIAGNOSIS — N952 Postmenopausal atrophic vaginitis: Secondary | ICD-10-CM | POA: Diagnosis not present

## 2017-06-22 DIAGNOSIS — N3010 Interstitial cystitis (chronic) without hematuria: Secondary | ICD-10-CM | POA: Diagnosis not present

## 2017-06-25 DIAGNOSIS — R000 Tachycardia, unspecified: Secondary | ICD-10-CM | POA: Insufficient documentation

## 2017-06-25 DIAGNOSIS — E782 Mixed hyperlipidemia: Secondary | ICD-10-CM | POA: Diagnosis not present

## 2017-06-25 DIAGNOSIS — F5101 Primary insomnia: Secondary | ICD-10-CM | POA: Diagnosis not present

## 2017-06-25 DIAGNOSIS — F329 Major depressive disorder, single episode, unspecified: Secondary | ICD-10-CM | POA: Diagnosis not present

## 2017-06-25 DIAGNOSIS — I10 Essential (primary) hypertension: Secondary | ICD-10-CM | POA: Diagnosis not present

## 2017-06-25 HISTORY — DX: Primary insomnia: F51.01

## 2017-06-26 DIAGNOSIS — N898 Other specified noninflammatory disorders of vagina: Secondary | ICD-10-CM | POA: Diagnosis not present

## 2017-06-26 DIAGNOSIS — Z853 Personal history of malignant neoplasm of breast: Secondary | ICD-10-CM | POA: Diagnosis not present

## 2017-06-28 ENCOUNTER — Other Ambulatory Visit (INDEPENDENT_AMBULATORY_CARE_PROVIDER_SITE_OTHER): Payer: Medicare Other

## 2017-06-28 ENCOUNTER — Ambulatory Visit (INDEPENDENT_AMBULATORY_CARE_PROVIDER_SITE_OTHER): Payer: Medicare Other | Admitting: Gastroenterology

## 2017-06-28 ENCOUNTER — Encounter: Payer: Self-pay | Admitting: Gastroenterology

## 2017-06-28 VITALS — BP 114/80 | HR 80 | Ht 64.0 in | Wt 116.0 lb

## 2017-06-28 DIAGNOSIS — K5900 Constipation, unspecified: Secondary | ICD-10-CM | POA: Diagnosis not present

## 2017-06-28 DIAGNOSIS — N898 Other specified noninflammatory disorders of vagina: Secondary | ICD-10-CM

## 2017-06-28 DIAGNOSIS — R1310 Dysphagia, unspecified: Secondary | ICD-10-CM | POA: Diagnosis not present

## 2017-06-28 LAB — COMPREHENSIVE METABOLIC PANEL
ALK PHOS: 83 U/L (ref 39–117)
ALT: 14 U/L (ref 0–35)
AST: 20 U/L (ref 0–37)
Albumin: 4 g/dL (ref 3.5–5.2)
BUN: 15 mg/dL (ref 6–23)
CO2: 30 meq/L (ref 19–32)
Calcium: 9.6 mg/dL (ref 8.4–10.5)
Chloride: 102 mEq/L (ref 96–112)
Creatinine, Ser: 1.1 mg/dL (ref 0.40–1.20)
GFR: 51.89 mL/min — ABNORMAL LOW (ref >60.00)
GLUCOSE: 91 mg/dL (ref 70–99)
POTASSIUM: 4.5 meq/L (ref 3.5–5.1)
SODIUM: 139 meq/L (ref 135–145)
TOTAL PROTEIN: 7.3 g/dL (ref 6.0–8.3)
Total Bilirubin: 0.4 mg/dL (ref 0.2–1.2)

## 2017-06-28 NOTE — Progress Notes (Signed)
HPI :  71 year old female with a history of long-standing dysphagia and GERD, here for a follow-up visit for new lower tract symptoms.   Long-standing dysphagia to both solids and liquids, multiple dilations over the years, she is not sure if these provided any benefit in the past. Last dilation in 2012. 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 PH study - Demeester score of 10, symptom index of 0. Tried althoids which helped somewhat. Recommended a trial of diltiazem 60mg  up to TID. She never tried it. Dysphagia has persisted and stable   The patient reports in January she felt fatigued and had some weight loss and poor appetite, along with some lower abdominal discomfort. She had a CT scan showing retained stool. She developed a vaginal discharge a few months ago, was treated with antibiotics which did not help too much. She has tried some vaginal suppositories as well which have not helped too much. She reports when she has been passing gas from her rectum she had developed some "gas bubbles" from her vagina as well. She had a negative cystoscopy recently per Dr. Gaynelle Arabian. She was seen by Dr. Lucia Gaskins of surgery as well. She reports she is eating better and has regained some weight otherwise.   She reports she is constipated at baseline. She is not taking anything for her bowels on a daily basis. She takes miralax as needed, otherwise taking prune juice or colace periodically. She reports longstanding constipation. She recently went a period of time of 14 days without a bowel movement. No blood in the stools she reliably sees. She does not see any stool coming out of her vagina, only discharge, but can stain her underwear. She has not had any UTIs.  CT abdomen / pelvis 02/27/17 - residual stool, otherwise no pathology, stable left sided hepatic hemangioma  Prior endoscopic evaluation: Colonoscopy 01/05/16 - one small adenoma, melanosis  coli EGD - 09/19/11 - stricture at GEJ, dilated to 37mm ballooon EGD 12/30/10 - normal exam, empiric dilation to 48mm EGD 08/2710 - stricture at GEJ, dilated to 39mm   Past Medical History:  Diagnosis Date  . Arthritis   . Asthma   . CAD (coronary artery disease) CARDIOLOGIST--  DR Angelena Form   mild non-obstructive cad  . Chronic constipation   . COPD (chronic obstructive pulmonary disease) ([place])   . Fibromyalgia   . Frequency of urination   . GERD (gastroesophageal reflux disease)   . H/O hiatal hernia   . History of basal cell carcinoma excision    X2  . History of breast cancer ONCOLOGIST-- DR Jana Hakim---  NO RECURRANCE   DX 07/2012;  LOW GRADE DCIS  ER+PR+  ----  S/P RIGHT LUMPECTOMY WITH NEGATIVE MARGINS/   RADIATION ENDED 11/2012  . History of chronic bronchitis   . History of colonic polyps   . History of tachycardia    CONTROLLED  WITH ATENOLOL  . Hypertension   . Osteoporosis   . Pelvic pain   . S/P radiation therapy 11/12/12 - 12/05/12   right Breast  . Sinus headache   . Urgency of urination      Past Surgical History:  Procedure Laterality Date  . [place] STUDY N/A 04/03/2016   Procedure: [place] STUDY;  Surgeon: Manus Gunning, MD;  Location: [facility];  Service: Gastroenterology;  Laterality: N/A;  . BREAST LUMPECTOMY Right 10-11-2012   W/ SLN BX  . CARDIAC CATHETERIZATION  09-13-2007  DR Lia Foyer   WELL-PRESERVED LVF/  DIFFUSE SCATTERED CORONARY CALCIFACATION AND ATHEROSCLEROSIS WITHOUT OBSTRUCTION  . CARDIAC CATHETERIZATION  08-04-2010  DR Angelena Form   NON-OBSTRUCTIVE CAD/  pLAD 40%/  oLAD 30%/  mLAD 30%/  pRCA 30%/  EF 60%  . CARDIOVASCULAR STRESS TEST  06-18-2012  DR McALHANY   LOW RISK NUCLEAR STUDY/  SMALL FIXED AREA OF MODERATELY DECREASED UPTAKE IN ANTEROSEPTAL WALL WHICH MAY BE ARTIFACTUAL/  NO ISCHEMIA/  EF 68%  . COLONOSCOPY  09-29-2010  . CYSTOSCOPY    . CYSTOSCOPY WITH HYDRODISTENSION AND BIOPSY N/A 03/06/2014   Procedure:  CYSTOSCOPY/HYDRODISTENSION/ INSTILATION OF MARCAINE AND PYRIDIUM;  Surgeon: Ailene Rud, MD;  Location: [facility];  Service: Urology;  Laterality: N/A;  . ESOPHAGEAL MANOMETRY N/A 04/03/2016   Procedure: ESOPHAGEAL MANOMETRY (EM);  Surgeon: Manus Gunning, MD;  Location: [facility];  Service: Gastroenterology;  Laterality: N/A;  . NASAL SINUS SURGERY  1985  . ORIF RIGHT ANKLE  FX  2006  . REMOVAL VOCAL CORD CYST  FEB 2014  . RIGHT BREAST BX  08-23-2012  . RIGHT HAND SURGERY  X3  LAST ONE 2009   INCLUDES  ORIF RIGHT 5TH FINGER AND REVISION TWICE  . TONSILLECTOMY AND ADENOIDECTOMY  AGE 12  . TOTAL ABDOMINAL HYSTERECTOMY W/ BILATERAL SALPINGOOPHORECTOMY  1982   W/  APPENDECTOMY  . TRANSTHORACIC ECHOCARDIOGRAM  06-24-2012   GRADE I DIASTOLIC DYSFUNCTION/  EF 55-60%/  MILD MR   Family History  Problem Relation Age of Onset  . Rectal cancer Mother   . Colon cancer Mother   . Breast cancer Mother   . Pancreatic cancer Mother   . Breast cancer Maternal Aunt        breast  . Irritable bowel syndrome Son   . Irritable bowel syndrome Daughter   . Heart disease Other   . Arthritis Other   . Diabetes Other        1st degree relative   . Hyperlipidemia Other   . Hypertension Other   . Osteoporosis Other   . Coronary artery disease Other 64  . Breast cancer Other        1st degree relative <50, lung  . Colon cancer Father    Social History  Substance Use Topics  . Smoking status: Former Smoker    Packs/day: 1.00    Years: 15.00    Types: Cigarettes    Quit date: 05/27/2012  . Smokeless tobacco: Never Used  . Alcohol use 0.0 oz/week     Comment: occasional   Current Outpatient Prescriptions  Medication Sig Dispense Refill  . acetaminophen (TYLENOL) 500 MG tablet Take 1,000 mg by mouth every 6 (six) hours as needed for mild pain or headache.     . albuterol (PROVENTIL HFA;VENTOLIN HFA) 108 (90 BASE) MCG/ACT inhaler Inhale 2 puffs into the lungs every  4 (four) hours as needed. 1 Inhaler 5  . aspirin EC 81 MG tablet Take 1 tablet (81 mg total) by mouth daily. 90 tablet 3  . atenolol (TENORMIN) 25 MG tablet Take 25 mg by mouth daily.    . DULoxetine (CYMBALTA) 60 MG capsule TAKE 1 BY MOUTH TWICE DAILY 180 capsule 1  . Estradiol 10 MCG TABS vaginal tablet Place 1 tablet (10 mcg total) vaginally 2 (two) times a week. 8 tablet 11  . fluticasone (FLONASE) 50 MCG/ACT nasal spray Place 1 spray into the nose as needed for allergies.     . furosemide (LASIX) 20 MG tablet  Take 1 tablet (20 mg total) by mouth every morning. 180 tablet 1  . gabapentin (NEURONTIN) 300 MG capsule Take 1 capsule (300 mg total) by mouth 2 (two) times daily. (Patient taking differently: Take 300 mg by mouth 3 (three) times daily. ) 60 capsule 6  . loratadine (CLARITIN) 10 MG tablet Take 1 tablet (10 mg total) by mouth daily. 30 tablet 11  . metroNIDAZOLE (METROGEL) 0.75 % vaginal gel Place 1 Applicatorful vaginally 2 (two) times daily. 70 g 0  . omeprazole (PRILOSEC) 40 MG capsule Take 40 mg by mouth 2 (two) times daily.    . pentosan polysulfate (ELMIRON) 100 MG capsule Take 1 capsule (100 mg total) by mouth 3 (three) times daily. Reported on 01/05/2016 (Patient taking differently: Take 100 mg by mouth 3 (three) times daily with meals as needed. Reported on 01/05/2016) 90 capsule 11  . predniSONE (DELTASONE) 10 MG tablet Take 4 tablets for 3 days, 3 tablets for 3 days, 2 tablets for 3 days, 1 tablet for 3 days 30 tablet 0  . tinidazole (TINDAMAX) 500 MG tablet Take 4 TABLETS TODAY AND REPEAT IN 24 HOURS 8 tablet 0  . traMADol (ULTRAM) 50 MG tablet Take 1-2 tablets (50-100 mg total) by mouth 3 (three) times daily as needed for moderate pain. 180 tablet 0  . traZODone (DESYREL) 100 MG tablet Take 1 tablet (100 mg total) by mouth at bedtime. 180 tablet 1  . URELLE (URELLE/URISED) 81 MG TABS tablet Take 1 tablet (81 mg total) by mouth 3 (three) times daily. 30 each 2   No current  facility-administered medications for this visit.    Facility-Administered Medications Ordered in Other Visits  Medication Dose Route Frequency Provider Last Rate Last Dose  . bupivacaine (MARCAINE) 0.5 % 10 mL, triamcinolone acetonide (KENALOG-40) 40 mg injection   Subcutaneous Once Carolan Clines, MD      . bupivacaine (MARCAINE) 0.5 % 15 mL, phenazopyridine (PYRIDIUM) 400 mg bladder mixture   Bladder Instillation Once Carolan Clines, MD       Allergies  Allergen Reactions  . Clindamycin Hcl Shortness Of Breath and Rash  . Penicillins Anaphylaxis  . Prednisone Shortness Of Breath and Rash  . Codeine Hives and Other (See Comments)    headache  . Fluzone [Flu Virus Vaccine] Other (See Comments)    Local reaction at the site  . Latex Hives  . Pentazocine Lactate Other (See Comments)    HALLUCINATION  . Pneumococcal Vaccine Polyvalent Hives, Swelling and Other (See Comments)    REACTION: redness, swelling, and hives at injection site  . Tamoxifen Nausea And Vomiting and Other (See Comments)    HEADACHE     Review of Systems: All systems reviewed and negative except where noted in HPI.    Lab Results  Component Value Date   WBC 5.0 04/20/2015   HGB 11.3 (L) 04/20/2015   HCT 36.0 04/20/2015   MCV 90.9 04/20/2015   PLT 221 04/20/2015    Lab Results  Component Value Date   CREATININE 1.10 06/28/2017   BUN 15 06/28/2017   NA 139 06/28/2017   K 4.5 06/28/2017   CL 102 06/28/2017   CO2 30 06/28/2017    Lab Results  Component Value Date   ALT 14 06/28/2017   AST 20 06/28/2017   ALKPHOS 83 06/28/2017   BILITOT 0.4 06/28/2017     Physical Exam: BP 114/80   Pulse 80   Ht 5\' 4"  (1.626 m)   Wt 116 lb (  52.6 kg)   BMI 19.91 kg/m  Constitutional: Pleasant, female in no acute distress. HEENT: Normocephalic and atraumatic. Conjunctivae are normal. No scleral icterus. Neck supple.  Cardiovascular: Normal rate, regular rhythm.  Pulmonary/chest: Effort normal  and breath sounds normal. No wheezing, rales or rhonchi. Abdominal: Soft, nondistended, nontender. There are no masses palpable. No hepatomegaly. DRE - small perianal skin tags, no fistulous tract, normal DRE Extremities: no edema Lymphadenopathy: No cervical adenopathy noted. Neurological: Alert and oriented to person place and time. Skin: Skin is warm and dry. No rashes noted. Psychiatric: Normal mood and affect. Behavior is normal.   ASSESSMENT AND PLAN: 70 year old female here for reassessment of the following issues:  Dysphagia - longstanding and intermittent, with dysphagia to both solids and liquids. Mild stenosis of the GEJ noted historically, unclear if this is pathologic or not, she has not had any lasting benefit from dilations. Barium study showed the pill getting hung up in the middle and lower esophagus without clear stenosis. Manometry study done last year showed a average DCI greater than 5000, suggestive of nutcracker esophagus at the time. Since the time of that exam criteria for nutcracker esophagus has changed (DCI > 8000), so unclear if significance of manometric findings. It has been 6 years since her last EGD, I offered her an EGD with empiric dilation even if normal to see if we can help her symptoms. Following discussion of the risks/benefits of endoscopy and anesthesia she wished proceed.  Constipation / vaginal discharge - long-standing constipation, however symptoms of vaginal discharge with "gas bubbles" is concerning for a possible fistula. Her cystoscopy was reportedly normal. To more definitively rule out a fistula I'm recommending an MRI of the pelvis for further evaluation. Her last colonoscopy in 2017 was unremarkable, she has no history of Crohn's disease. Further recommendations pending the MRI results. In the interim recommend MiraLAX once daily for constipation and titrate up as needed. She is responded well to MiraLAX in the past  Avenal Cellar,  MD [facility] Pager [phone]

## 2017-06-28 NOTE — Patient Instructions (Addendum)
If you are age 72 or older, your body mass index should be between 23-30. Your Body mass index is 19.91 kg/m. If this is out of the aforementioned range listed, please consider follow up with your Primary Care Provider.  If you are age 13 or younger, your body mass index should be between 19-25. Your Body mass index is 19.91 kg/m. If this is out of the aformentioned range listed, please consider follow up with your Primary Care Provider.   You have been scheduled for an endoscopy. Please follow written instructions given to you at your visit today. If you use inhalers (even only as needed), please bring them with you on the day of your procedure. Your physician has requested that you go to www.startemmi.com and enter the access code given to you at your visit today. This web site gives a general overview about your procedure. However, you should still follow specific instructions given to you by our office regarding your preparation for the procedure.  Your physician has requested that you go to the basement for the following lab work before leaving today:  CMET  Please use Miralax daily.  You have been scheduled for an MRI at [facility] on Tuesday, July 10th. Your appointment time is 8:00am. Please arrive 15 minutes prior to your appointment time for registration purposes. Please make certain not to have anything to eat or drink after midnight prior to your test. In addition, if you have any metal in your body, have a pacemaker or defibrillator, please be sure to let your ordering physician know. This test typically takes 45 minutes to 1 hour to complete. Should you need to reschedule, please call [phone] to do so.  Thank you.

## 2017-06-29 ENCOUNTER — Encounter: Payer: Self-pay | Admitting: Gastroenterology

## 2017-07-02 ENCOUNTER — Encounter: Payer: Medicare Other | Admitting: Gastroenterology

## 2017-07-03 ENCOUNTER — Ambulatory Visit (HOSPITAL_COMMUNITY)
Admission: RE | Admit: 2017-07-03 | Discharge: 2017-07-03 | Disposition: A | Discharge status: Home / Self Care | Payer: Medicare Other | Source: Ambulatory Visit | Attending: Gastroenterology | Admitting: Gastroenterology

## 2017-07-03 DIAGNOSIS — Z90710 Acquired absence of both cervix and uterus: Secondary | ICD-10-CM | POA: Insufficient documentation

## 2017-07-03 DIAGNOSIS — N898 Other specified noninflammatory disorders of vagina: Secondary | ICD-10-CM | POA: Diagnosis not present

## 2017-07-03 DIAGNOSIS — N760 Acute vaginitis: Secondary | ICD-10-CM | POA: Diagnosis not present

## 2017-07-03 DIAGNOSIS — K5900 Constipation, unspecified: Secondary | ICD-10-CM | POA: Insufficient documentation

## 2017-07-03 MED ORDER — GADOBENATE DIMEGLUMINE 529 MG/ML IV SOLN
10.0000 mL | Freq: Once | INTRAVENOUS | Status: AC | PRN
  Administered 2017-07-03: 9 mL via INTRAVENOUS

## 2017-07-05 ENCOUNTER — Telehealth: Payer: Self-pay | Admitting: Gastroenterology

## 2017-07-05 NOTE — Telephone Encounter (Signed)
The patient has been notified that the results are not reviewed by Dr Havery Moros.  We will call her as soon as resulted

## 2017-07-06 ENCOUNTER — Encounter: Payer: Medicare Other | Admitting: Gastroenterology

## 2017-07-09 ENCOUNTER — Telehealth: Payer: Self-pay | Admitting: Neurology

## 2017-07-09 DIAGNOSIS — M542 Cervicalgia: Secondary | ICD-10-CM

## 2017-07-09 NOTE — Addendum Note (Signed)
Addended by: Monte Fantasia on: 07/09/2017 02:18 PM   Modules accepted: Orders

## 2017-07-09 NOTE — Telephone Encounter (Signed)
Patient called office in reference to see if she can have a new updated referral sent to [facility] in [place].  Patient attended up until April and would like to start back services due to neck pain.  Please call

## 2017-07-09 NOTE — Telephone Encounter (Signed)
Pt was last seen in March and referred to PT for neck pain. She saw Dr. Brett Fairy in April for OSA and has a f/u appt scheduled w/ Dr. Jaynee Eagles in Sept.

## 2017-07-09 NOTE — Telephone Encounter (Signed)
Please order.  thanks

## 2017-07-09 NOTE — Telephone Encounter (Signed)
Patient requesting imaging results

## 2017-07-09 NOTE — Telephone Encounter (Signed)
New PT orders e-scribed to [place].

## 2017-07-10 NOTE — Telephone Encounter (Signed)
Spoke to patient on 07/09/17,advised of results. She changed her EGD to later in August. Sent message to Dr. Havery Moros to see if he wants to have her do flex sig sooner than 08/13/17.

## 2017-07-12 ENCOUNTER — Ambulatory Visit (HOSPITAL_BASED_OUTPATIENT_CLINIC_OR_DEPARTMENT_OTHER): Payer: Medicare Other | Admitting: Oncology

## 2017-07-12 ENCOUNTER — Telehealth: Payer: Self-pay

## 2017-07-12 VITALS — BP 133/69 | HR 75 | Temp 98.2°F | Resp 18 | Ht 64.0 in | Wt 117.2 lb

## 2017-07-12 DIAGNOSIS — Z86000 Personal history of in-situ neoplasm of breast: Secondary | ICD-10-CM | POA: Diagnosis not present

## 2017-07-12 DIAGNOSIS — Z170 Estrogen receptor positive status [ER+]: Principal | ICD-10-CM

## 2017-07-12 DIAGNOSIS — C50511 Malignant neoplasm of lower-outer quadrant of right female breast: Secondary | ICD-10-CM

## 2017-07-12 NOTE — Progress Notes (Signed)
ID: [name]   DOB: March 22, 1946  MR#: [id number]  LEX#:[id number]  PCP: Wayland Salinas, MD GYN:  SU: Alphonsa Overall,  OTHER MD: Eppie Gibson, Baltazar Apo, Erskine Emery, Jacques Navy, Madelon Lips Wright,   HISTORY OF PRESENT ILLNESS: From the original intake note:  [name] noted a minimal discharge from the right breast early and 2013. She did not think much of this until it became more copious him a and then she brought it to Dr. Ashley Murrain attention and bilateral breast MRI 07/29/2012 showed 2 areas of concern in the right breast. Right breast biopsy x3 was obtained 08/23/2012, and showed ([id number]) atypical ductal hyperplasia partially involving a papillary lesion. Accordingly, on 10/11/2012 the patient underwent right lumpectomy for what proved to be ([id number] ductal carcinoma in situ, low-grade, measuring approximately 3.2 cm, with negative margins (0.15 cm), 100% estrogen receptor positive, 61% progesterone receptor positive.  With his results the patient was referred to radiation oncology and she is currently receiving of radiation treatments under Dr. Isidore Moos, scheduled to be completed mid December 2013.  The patient's subsequent history is as detailed below.  INTERVAL HISTORY: [name] returns today for follow-up and treatment of her noninvasive estrogen receptor positive breast cancer. She is under observation alone. Also, because of her extreme breast density and significant family history, she receives yearly breast MRI in addition to yearly mammography. Her most recent breast MRI was from 12/22/2016, continues to show breast density to be category D, but there was no evidence of malignancy.  REVIEW OF SYSTEMS: She is being evaluated for a rectovaginal fistula and she certainly has a symptoms to go along with it. Very likely she will end up needing surgery and of course Dr. Alphonsa Overall her breast surgeon would be able to take care of that for her. She tells me her mental fog  has largely cleared since her tramadol dose was decreased. She does have a little bit more pain now but her new primary care doctor, Dr. Leonides Cave, is going to be dealing with that. Aside from those issues [name] generally feels well and a detailed review of systems today was noncontributory  PAST MEDICAL HISTORY: Past Medical History:  Diagnosis Date  . Arthritis   . Asthma   . CAD (coronary artery disease) CARDIOLOGIST--  DR Angelena Form   mild non-obstructive cad  . Chronic constipation   . COPD (chronic obstructive pulmonary disease) ([place])   . Fibromyalgia   . Frequency of urination   . GERD (gastroesophageal reflux disease)   . H/O hiatal hernia   . History of basal cell carcinoma excision    X2  . History of breast cancer ONCOLOGIST-- DR Jana Hakim---  NO RECURRANCE   DX 07/2012;  LOW GRADE DCIS  ER+PR+  ----  S/P RIGHT LUMPECTOMY WITH NEGATIVE MARGINS/   RADIATION ENDED 11/2012  . History of chronic bronchitis   . History of colonic polyps   . History of tachycardia    CONTROLLED  WITH ATENOLOL  . Hypertension   . Osteoporosis   . Pelvic pain   . S/P radiation therapy 11/12/12 - 12/05/12   right Breast  . Sinus headache   . Urgency of urination     PAST SURGICAL HISTORY: Past Surgical History:  Procedure Laterality Date  . [place] STUDY N/A 04/03/2016   Procedure: [place] STUDY;  Surgeon: Manus Gunning, MD;  Location: [facility];  Service: Gastroenterology;  Laterality: N/A;  . BREAST LUMPECTOMY Right 10-11-2012  W/ SLN BX  . CARDIAC CATHETERIZATION  09-13-2007  DR Lia Foyer   WELL-PRESERVED LVF/  DIFFUSE SCATTERED CORONARY CALCIFACATION AND ATHEROSCLEROSIS WITHOUT OBSTRUCTION  . CARDIAC CATHETERIZATION  08-04-2010  DR Angelena Form   NON-OBSTRUCTIVE CAD/  pLAD 40%/  oLAD 30%/  mLAD 30%/  pRCA 30%/  EF 60%  . CARDIOVASCULAR STRESS TEST  06-18-2012  DR McALHANY   LOW RISK NUCLEAR STUDY/  SMALL FIXED AREA OF MODERATELY DECREASED UPTAKE IN ANTEROSEPTAL WALL WHICH  MAY BE ARTIFACTUAL/  NO ISCHEMIA/  EF 68%  . COLONOSCOPY  09-29-2010  . CYSTOSCOPY    . CYSTOSCOPY WITH HYDRODISTENSION AND BIOPSY N/A 03/06/2014   Procedure: CYSTOSCOPY/HYDRODISTENSION/ INSTILATION OF MARCAINE AND PYRIDIUM;  Surgeon: Ailene Rud, MD;  Location: [facility];  Service: Urology;  Laterality: N/A;  . ESOPHAGEAL MANOMETRY N/A 04/03/2016   Procedure: ESOPHAGEAL MANOMETRY (EM);  Surgeon: Manus Gunning, MD;  Location: [facility];  Service: Gastroenterology;  Laterality: N/A;  . NASAL SINUS SURGERY  1985  . ORIF RIGHT ANKLE  FX  2006  . REMOVAL VOCAL CORD CYST  FEB 2014  . RIGHT BREAST BX  08-23-2012  . RIGHT HAND SURGERY  X3  LAST ONE 2009   INCLUDES  ORIF RIGHT 5TH FINGER AND REVISION TWICE  . TONSILLECTOMY AND ADENOIDECTOMY  AGE 69  . TOTAL ABDOMINAL HYSTERECTOMY W/ BILATERAL SALPINGOOPHORECTOMY  1982   W/  APPENDECTOMY  . TRANSTHORACIC ECHOCARDIOGRAM  06-24-2012   GRADE I DIASTOLIC DYSFUNCTION/  EF 55-60%/  MILD MR    FAMILY HISTORY Family History  Problem Relation Age of Onset  . Rectal cancer Mother   . Colon cancer Mother   . Breast cancer Mother   . Pancreatic cancer Mother   . Breast cancer Maternal Aunt        breast  . Irritable bowel syndrome Son   . Irritable bowel syndrome Daughter   . Heart disease Other   . Arthritis Other   . Diabetes Other        1st degree relative   . Hyperlipidemia Other   . Hypertension Other   . Osteoporosis Other   . Coronary artery disease Other 65  . Breast cancer Other        1st degree relative <50, lung  . Colon cancer Father     GYNECOLOGIC HISTORY: Menarche age 60, first live birth age 73, TAH/BSO in 22. She was on hormone replacement between 1982 and 2013, at the time of her breast cancer diagnosis.  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 Her husband [name] used to be Insurance underwriter  for [employer], now is retired and has helped manage a friend's car lot, something he sees as an location of his ministry. Their children are [name], who is an Chief Financial Officer with [employer] in [place]; [name], who works as an Radio producer for [employer] and lives in [place]; and [name], who works as a Radio producer (fourth-grade) in [place].   ADVANCED DIRECTIVES: In place  HEALTH MAINTENANCE: Social History  Substance Use Topics  . Smoking status: Former Smoker    Packs/day: 1.00    Years: 15.00    Types: Cigarettes    Quit date: 05/27/2012  . Smokeless tobacco: Never Used  . Alcohol use 0.0 oz/week     Comment: occasional     Colonoscopy: 09/29/2010 Erskine Emery)  PAP: JAN 2013 [facility])  Bone density: 04/17/2011/ osteoporosis  Lipid panel:  Allergies  Allergen Reactions  . Clindamycin Hcl Shortness Of Breath and Rash  . Penicillins Anaphylaxis  . Prednisone Shortness Of Breath and Rash  . Codeine Hives and Other (See Comments)    headache  . Fluzone [Flu Virus Vaccine] Other (See Comments)    Local reaction at the site  . Latex Hives  . Pentazocine Lactate Other (See Comments)    HALLUCINATION  . Pneumococcal Vaccine Polyvalent Hives, Swelling and Other (See Comments)    REACTION: redness, swelling, and hives at injection site  . Tamoxifen Nausea And Vomiting and Other (See Comments)    HEADACHE    Current Outpatient Prescriptions  Medication Sig Dispense Refill  . acetaminophen (TYLENOL) 500 MG tablet Take 1,000 mg by mouth every 6 (six) hours as needed for mild pain or headache.     . albuterol (PROVENTIL HFA;VENTOLIN HFA) 108 (90 BASE) MCG/ACT inhaler Inhale 2 puffs into the lungs every 4 (four) hours as needed. 1 Inhaler 5  . aspirin EC 81 MG tablet Take 1 tablet (81 mg total) by mouth daily. 90 tablet 3  . atenolol (TENORMIN) 25 MG tablet Take 25 mg by mouth daily.    . DULoxetine (CYMBALTA) 60 MG capsule TAKE 1 BY MOUTH TWICE DAILY 180  capsule 1  . Estradiol 10 MCG TABS vaginal tablet Place 1 tablet (10 mcg total) vaginally 2 (two) times a week. 8 tablet 11  . fluticasone (FLONASE) 50 MCG/ACT nasal spray Place 1 spray into the nose as needed for allergies.     . furosemide (LASIX) 20 MG tablet Take 1 tablet (20 mg total) by mouth every morning. (Patient taking differently: Take 20 mg by mouth every morning. ) 180 tablet 1  . gabapentin (NEURONTIN) 300 MG capsule Take 1 capsule (300 mg total) by mouth 2 (two) times daily. (Patient taking differently: Take 300 mg by mouth 3 (three) times daily. ) 60 capsule 6  . loratadine (CLARITIN) 10 MG tablet Take 1 tablet (10 mg total) by mouth daily. 30 tablet 11  . metroNIDAZOLE (METROGEL) 0.75 % vaginal gel Place 1 Applicatorful vaginally 2 (two) times daily. 70 g 0  . omeprazole (PRILOSEC) 40 MG capsule Take 40 mg by mouth 2 (two) times daily.    . pentosan polysulfate (ELMIRON) 100 MG capsule Take 1 capsule (100 mg total) by mouth 3 (three) times daily. Reported on 01/05/2016 (Patient taking differently: Take 100 mg by mouth 3 (three) times daily with meals as needed. Reported on 01/05/2016) 90 capsule 11  . traMADol (ULTRAM) 50 MG tablet Take 1-2 tablets (50-100 mg total) by mouth 3 (three) times daily as needed for moderate pain. 180 tablet 0  . traZODone (DESYREL) 100 MG tablet Take 1 tablet (100 mg total) by mouth at bedtime. 180 tablet 1   No current facility-administered medications for this visit.    Facility-Administered Medications Ordered in Other Visits  Medication Dose Route Frequency Provider Last Rate Last Dose  . bupivacaine (MARCAINE) 0.5 % 10 mL, triamcinolone acetonide (KENALOG-40) 40 mg injection   Subcutaneous Once Carolan Clines, MD      . bupivacaine (MARCAINE) 0.5 % 15 mL, phenazopyridine (PYRIDIUM) 400 mg bladder mixture   Bladder Instillation Once Carolan Clines, MD        OBJECTIVE: middle-aged white woman In no acute distress  Vitals:   07/12/17  1307  BP: 133/69  Pulse: 75  Resp: 18  Temp: 98.2 F (36.8 C)  Body mass index is 20.12 kg/m.    ECOG FS: 1  Sclerae unicteric, pupils round and equal Oropharynx clear and moist No cervical or supraclavicular adenopathy Lungs no rales or rhonchi Heart regular rate and rhythm Abd soft, nontender, positive bowel sounds MSK no focal spinal tenderness, no upper extremity lymphedema Neuro: nonfocal, well oriented, appropriate affect Breasts: The right breast is status post lumpectomy and radiation with no evidence of local recurrence. The left breast is benign. Both axillae are benign.  LAB RESULTS: Lab Results  Component Value Date   WBC 5.0 04/20/2015   NEUTROABS 3.0 04/20/2015   HGB 11.3 (L) 04/20/2015   HCT 36.0 04/20/2015   MCV 90.9 04/20/2015   PLT 221 04/20/2015      Chemistry      Component Value Date/Time   NA 139 06/28/2017 1055   NA 140 04/20/2015 1050   K 4.5 06/28/2017 1055   K 3.9 04/20/2015 1050   CL 102 06/28/2017 1055   CL 103 11/12/2012 1549   CO2 30 06/28/2017 1055   CO2 26 04/20/2015 1050   BUN 15 06/28/2017 1055   BUN 13.1 04/20/2015 1050   CREATININE 1.10 06/28/2017 1055   CREATININE 1.1 04/20/2015 1050      Component Value Date/Time   CALCIUM 9.6 06/28/2017 1055   CALCIUM 8.9 04/20/2015 1050   ALKPHOS 83 06/28/2017 1055   ALKPHOS 87 04/20/2015 1050   AST 20 06/28/2017 1055   AST 23 04/20/2015 1050   ALT 14 06/28/2017 1055   ALT 16 04/20/2015 1050   BILITOT 0.4 06/28/2017 1055   BILITOT 0.26 04/20/2015 1050       No results found for: LABCA2  No components found for: [id number]  No results for input(s): INR in the last 168 hours.  Urinalysis    Component Value Date/Time   COLORURINE YELLOW 05/29/2017 1629   APPEARANCEUR CLEAR 05/29/2017 1629   LABSPEC 1.020 05/29/2017 1629   PHURINE 6.0 05/29/2017 1629   GLUCOSEU NEGATIVE 05/29/2017 1629   GLUCOSEU NEGATIVE 07/19/2011 1457   HGBUR NEGATIVE 05/29/2017 1629   BILIRUBINUR  NEGATIVE 05/29/2017 1629   BILIRUBINUR Neg 04/30/2015 1153   KETONESUR NEGATIVE 05/29/2017 1629   PROTEINUR NEGATIVE 05/29/2017 1629   UROBILINOGEN 0.2 04/30/2015 1153   UROBILINOGEN 0.2 01/06/2014 1219   NITRITE NEGATIVE 05/29/2017 1629   LEUKOCYTESUR NEGATIVE 05/29/2017 1629    STUDIES: Study Result   Mr Pelvis W Wo Contrast  Result Date: 07/03/2017 CLINICAL DATA:  Longstanding constipation. Vaginal gaseous discharge with bacteria vaginal infection, prompting clinical concern for vaginal fistula. EXAM: MRI PELVIS WITHOUT AND WITH CONTRAST TECHNIQUE: Multiplanar multisequence MR imaging of the pelvis was performed both before and after administration of intravenous contrast. CONTRAST:  38mL MULTIHANCE GADOBENATE DIMEGLUMINE 529 MG/ML IV SOLN COMPARISON:  02/27/2017 CT abdomen/pelvis. FINDINGS: Urinary Tract: Relatively collapsed and grossly normal bladder. Urethra appears normal. Bowel: Visualized small and large bowel are normal caliber with no bowel wall thickening. No evidence of a perianal fistula. No significant diverticulosis in the visualized bowel. Vascular/Lymphatic: No pathologically enlarged lymph nodes in the pelvis. No acute vascular abnormality. Reproductive: Status posthysterectomy. No mass or fluid collection at the vaginal cuff. There is focal loss of the normal fat plane between the posterior vaginal wall and anterior rectal wall (best seen on series 6/image 24 and series 5/image 24), located just to the left of midline approximately 1.2 cm below the vaginal cuff. No discrete fistula tract. No associated mass, enhancement or fluid collection in this location.  No associated vaginal wall thickening or hyperenhancement. Status post bilateral salpingo-oophorectomy.  No adnexal masses. Other: No abnormal free fluid in the pelvis. No focal pelvic fluid collection. Musculoskeletal: No aggressive appearing focal osseous lesions. IMPRESSION: 1. Focal loss of the normal fat plane between the  posterior vaginal wall and anterior rectal wall as detailed. This is a nonspecific finding that could simply represent a small adhesion, although a small rectovaginal fistula could be present in this location. No associated mass, fluid collections or active inflammatory changes. 2. Hysterectomy.  No abnormal findings at the vaginal cuff. 3. Bilateral oophorectomy.  No adnexal masses. Electronically Signed   By: Ilona Sorrel M.D.   On: 07/03/2017 10:04    ASSESSMENT: 71 y.o. [name] woman s/p Right lumpectomy 10/11/2012 for low-grade ductal carcinoma in situ, estrogen and progesterone receptor positive, with negative though close margins  (1) radiation therapy completed 12/05/2012  (2) tamoxifen started at the completion of radiation, tolerated poorly, discontinued 03/10/2013  (3) breast density category D  PLAN:  Eriyanna is now nearly 5 years out from definitive surgery for her noninvasive breast cancer. This is very favorable.  At this point I would normally feel comfortable releasing her to her primary care physician. However she and I remain concerned about her very dense breasts as well as her family history.  We have been obtaining MRIs every year. However this is very expensive and we do not have data that this actually saved slides.  After much discussion today we decided that she will have mammography with tomography every year but no MRI unless there is an abnormality in the mammogram that cannot be otherwise evaluated.  I think we are both comfortable with this decision. She will see me again in one year. She knows to call for any problems that may develop before that visit.   MAGRINAT,GUSTAV C    07/12/2017

## 2017-07-12 NOTE — Telephone Encounter (Signed)
Left message for patient to call back. Dr. Havery Moros okay'd if she would like the flex sig done sooner than her EGD that is fine, also okay to wait if she wants to do at same time. Asked patient to call me back to see how she would like to schedule this procedure. Patient will also need a pre-visit appointment (flex sig).

## 2017-07-13 DIAGNOSIS — N952 Postmenopausal atrophic vaginitis: Secondary | ICD-10-CM | POA: Diagnosis not present

## 2017-07-13 DIAGNOSIS — N3010 Interstitial cystitis (chronic) without hematuria: Secondary | ICD-10-CM | POA: Diagnosis not present

## 2017-07-16 ENCOUNTER — Ambulatory Visit (AMBULATORY_SURGERY_CENTER): Payer: Self-pay | Admitting: *Deleted

## 2017-07-16 VITALS — Ht 64.0 in | Wt 117.2 lb

## 2017-07-16 DIAGNOSIS — N898 Other specified noninflammatory disorders of vagina: Secondary | ICD-10-CM

## 2017-07-16 NOTE — Progress Notes (Signed)
No egg or soy allergy known to patient  No issues with past sedation with any surgeries  or procedures, no intubation problems - post op 1 surgery had the shakes, temp dropped  No diet pills per patient No home 02 use per patient  No blood thinners per patient  Pt denies issues with constipation  No A fib but history of  A flutter - nothing current  EMMI video sent to pt's e mail

## 2017-07-17 ENCOUNTER — Ambulatory Visit (INDEPENDENT_AMBULATORY_CARE_PROVIDER_SITE_OTHER): Payer: Medicare Other | Admitting: Physical Therapy

## 2017-07-17 ENCOUNTER — Encounter: Payer: Self-pay | Admitting: Physical Therapy

## 2017-07-17 DIAGNOSIS — M542 Cervicalgia: Secondary | ICD-10-CM | POA: Diagnosis not present

## 2017-07-17 DIAGNOSIS — M6281 Muscle weakness (generalized): Secondary | ICD-10-CM

## 2017-07-17 DIAGNOSIS — M62838 Other muscle spasm: Secondary | ICD-10-CM

## 2017-07-17 NOTE — Therapy (Signed)
[facility] [place], [place], [address] Phone: [phone]   Fax:  [phone]  Physical Therapy Evaluation  Patient Details  Name: [name] MRN: [id number] Date of Birth: Jan 03, 1946 Referring Provider: Dr Myrla Halsted  Encounter Date: 07/17/2017      PT End of Session - 07/17/17 0850    Visit Number 1   Number of Visits 12   Date for PT Re-Evaluation 08/28/17   PT Start Time 0851   PT Stop Time 0942   PT Time Calculation (min) 51 min   Activity Tolerance Patient tolerated treatment well      Past Medical History:  Diagnosis Date  . Allergy   . Anemia   . Anxiety    past hx   . Arthritis   . Asthma   . Atrial flutter ([place])    past history- not current  . CAD (coronary artery disease) CARDIOLOGIST--  DR Angelena Form   mild non-obstructive cad  . Cancer ([place])    right  . Cataract    bilaterally removed   . Chronic constipation   . Chronic kidney disease    interstitial cystitis  . COPD (chronic obstructive pulmonary disease) ([place])   . Depression    past hx   . Family history of malignant hyperthermia    father had this  . Fibromyalgia   . Frequency of urination   . GERD (gastroesophageal reflux disease)   . H/O hiatal hernia   . History of basal cell carcinoma excision    X2  . History of breast cancer ONCOLOGIST-- DR Jana Hakim---  NO RECURRANCE   DX 07/2012;  LOW GRADE DCIS  ER+PR+  ----  S/P RIGHT LUMPECTOMY WITH NEGATIVE MARGINS/   RADIATION ENDED 11/2012  . History of chronic bronchitis   . History of colonic polyps   . History of tachycardia    CONTROLLED  WITH ATENOLOL  . Hyperlipidemia   . Hypertension   . Neuromuscular disorder (H[facility])    fibromyalgia  . Osteoporosis   . Pelvic pain   . S/P radiation therapy 11/12/12 - 12/05/12   right Breast  . Sepsis ([place]) 2014   from UTI   . Sinus headache   . Urgency of urination     Past Surgical History:  Procedure Laterality Date  .  [place] STUDY N/A 04/03/2016   Procedure: [place] STUDY;  Surgeon: Manus Gunning, MD;  Location: [facility];  Service: Gastroenterology;  Laterality: N/A;  . BREAST LUMPECTOMY Right 10-11-2012   W/ SLN BX  . CARDIAC CATHETERIZATION  09-13-2007  DR Lia Foyer   WELL-PRESERVED LVF/  DIFFUSE SCATTERED CORONARY CALCIFACATION AND ATHEROSCLEROSIS WITHOUT OBSTRUCTION  . CARDIAC CATHETERIZATION  08-04-2010  DR Angelena Form   NON-OBSTRUCTIVE CAD/  pLAD 40%/  oLAD 30%/  mLAD 30%/  pRCA 30%/  EF 60%  . CARDIOVASCULAR STRESS TEST  06-18-2012  DR McALHANY   LOW RISK NUCLEAR STUDY/  SMALL FIXED AREA OF MODERATELY DECREASED UPTAKE IN ANTEROSEPTAL WALL WHICH MAY BE ARTIFACTUAL/  NO ISCHEMIA/  EF 68%  . COLONOSCOPY  09-29-2010  . CYSTOSCOPY    . CYSTOSCOPY WITH HYDRODISTENSION AND BIOPSY N/A 03/06/2014   Procedure: CYSTOSCOPY/HYDRODISTENSION/ INSTILATION OF MARCAINE AND PYRIDIUM;  Surgeon: Ailene Rud, MD;  Location: [facility];  Service: Urology;  Laterality: N/A;  . ESOPHAGEAL MANOMETRY N/A 04/03/2016   Procedure: ESOPHAGEAL MANOMETRY (EM);  Surgeon: Manus Gunning, MD;  Location: [facility];  Service: Gastroenterology;  Laterality: N/A;  . NASAL SINUS SURGERY  1985  . ORIF RIGHT ANKLE  FX  2006  . POLYPECTOMY    . REMOVAL VOCAL CORD CYST  FEB 2014  . RIGHT BREAST BX  08-23-2012  . RIGHT HAND SURGERY  X3  LAST ONE 2009   INCLUDES  ORIF RIGHT 5TH FINGER AND REVISION TWICE  . TONSILLECTOMY AND ADENOIDECTOMY  AGE 47  . TOTAL ABDOMINAL HYSTERECTOMY W/ BILATERAL SALPINGOOPHORECTOMY  1982   W/  APPENDECTOMY  . TRANSTHORACIC ECHOCARDIOGRAM  06-24-2012   GRADE I DIASTOLIC DYSFUNCTION/  EF 55-60%/  MILD MR  . UPPER GASTROINTESTINAL ENDOSCOPY      There were no vitals filed for this visit.       Subjective Assessment - 07/17/17 1242    Subjective Pt reports she has been out of town for many weeks visiting family along the [place].  Did a ton of sitting during  the traveling. She was doing great until this past month and the neck pain started to return when she was coming home.  Started in the Lt upper shoulder, has been performing some of her HEP as able.    Pertinent History osteoporosis, having some other health issues with her bowels/vagina getting more tests this week. Reports they have ruled out cancer   Patient Stated Goals reduce pain to base line, reduce the tightness she has been feeling to allow her to reduce her pain medication   Currently in Pain? Yes   Pain Score 4    Pain Location Neck   Pain Orientation Left;Right   Pain Descriptors / Indicators Burning;Tightness;Squeezing   Pain Type Acute pain   Pain Onset More than a month ago   Pain Frequency Constant   Aggravating Factors  positional   Pain Relieving Factors heat, gentle exercise stretching            [facility] PT Assessment - 07/17/17 0001      Assessment   Medical Diagnosis Musculoskeletal neck pain   Referring Provider Dr Myrla Halsted   Onset Date/Surgical Date 06/17/17   Hand Dominance Right   Prior Therapy yes in April     Precautions   Precautions --  osteoporosis     Balance Screen   Has the patient fallen in the past 6 months Yes   How many times? 3  tripping over her feet and other items   Has the patient had a decrease in activity level because of a fear of falling?  No  has started using her cane for longer distances   Is the patient reluctant to leave their home because of a fear of falling?  No     Home Ecologist residence     Prior Function   Level of Independence Independent   Vocation Retired     Observation/Other Assessments   Focus on Therapeutic Outcomes (FOTO)  59% limited     Posture/Postural Control   Posture/Postural Control Postural limitations   Postural Limitations Rounded Shoulders;Increased thoracic kyphosis;Forward head     ROM / Strength   AROM / PROM / Strength AROM;Strength     AROM   AROM  Assessment Site Shoulder;Cervical   Cervical Flexion 44  with pain   Cervical Extension 54   Cervical - Right Side Bend 23   Cervical - Left Side Bend 29   Cervical - Right Rotation 79   Cervical - Left Rotation 58     Strength   Strength Assessment  Site Shoulder;Elbow   Right/Left Shoulder --  Rt WNL, Lt WNL except abduct/ER 4+/5   Right/Left Elbow --  Rt WNL, Lt flex 5/5, Ext 4/5     Palpation   Palpation comment tightness bilat upper traps and peri cervical            Objective measurements completed on examination: See above findings.          [facility] Adult PT Treatment/Exercise - 07/17/17 0001      Modalities   Modalities Electrical Stimulation;Moist Heat     Moist Heat Therapy   Number Minutes Moist Heat 15 Minutes   Moist Heat Location Cervical     Electrical Stimulation   Electrical Stimulation Location cervical   Electrical Stimulation Action IFC   Electrical Stimulation Parameters to tolerance   Electrical Stimulation Goals Tone;Pain     Manual Therapy   Manual Therapy Soft tissue mobilization   Soft tissue mobilization bilat upper traps and periocciput          Trigger Point Dry Needling - 07/17/17 0930    Consent Given? Yes   Education Handout Provided No  had handout last episode   Muscles Treated Upper Body Upper trapezius;Oblique capitus;Suboccipitals muscle group   Upper Trapezius Response Twitch reponse elicited;Palpable increased muscle length   Oblique Capitus Response Palpable increased muscle length;Twitch response elicited   SubOccipitals Response Twitch response elicited;Palpable increased muscle length                   PT Long Term Goals - 07/17/17 0850      PT LONG TERM GOAL #1   Title independent with HEP (08/28/17)   Time 6   Period Weeks   Target Date 08/28/17     PT LONG TERM GOAL #2   Title improve Lt cervical rotation =/> 65 degrees (95/4/18)    Time 6   Period Weeks   Status New   Target Date  08/28/17     PT LONG TERM GOAL #3   Title report 75% improvement in symtoms (08/28/17)   Time 6   Period Weeks   Status New   Target Date 08/28/17     PT LONG TERM GOAL #4   Title improve FOTO =/< 40% limited, CJ level (9/4 /18)    Time 6   Period Weeks   Status New   Target Date 08/28/17     PT LONG TERM GOAL #5   Title demo upper back strength =/> 4+/5    Time 6   Period Weeks   Status New   Target Date 08/28/17                Plan - 07/17/17 0928    Clinical Impression Statement [name] was doing very well managing her neck pain until she had multiple weeks of travel with prolonged sitting.  She has been doing her HEP however the neck pain is slowly increasing.  She is also working with her MD to wean off some of her pain medication. Returning to therapy to bring pain level back down and help with the medication weaning. She has some decreased ROM and strength and multiple areas of tightness /trigger points.  [name] is currently have tests performed to figure out some other medical issues and depending on the results and possible further treatment she may have some difficulty attending sessions.     Clinical Presentation Evolving   Clinical Decision Making Moderate   Rehab Potential Good   PT  Frequency 2x / week   PT Duration 6 weeks   PT Treatment/Interventions Dry needling;Taping;Manual techniques;Therapeutic exercise;Moist Heat;Ultrasound;Cryotherapy;Electrical Stimulation;Iontophoresis 4mg /ml Dexamethasone;Patient/family education;Passive range of motion   PT Next Visit Plan pt to call us after her medical procedure on Thursday to schedule.    Consulted and Agree with Plan of Care Patient      Patient will benefit from skilled therapeutic intervention in order to improve the following deficits and impairments:  Decreased range of motion, Increased muscle spasms, Pain, Postural dysfunction, Decreased strength  Visit Diagnosis: Muscle weakness (generalized) - Plan: PT  plan of care cert/re-cert  Cervicalgia - Plan: PT plan of care cert/re-cert  Other muscle spasm - Plan: PT plan of care cert/re-cert      [facility] PT PB G-CODES - 08-12-2017 1258    Functional Assessment Tool Used  foto and professional judgement   Functional Limitations Other PT primary   Other PT Primary Current Status [phone]) At least 40 percent but less than 60 percent impaired, limited or restricted   Other PT Primary Goal Status (W9675) At least 20 percent but less than 40 percent impaired, limited or restricted       Problem List Patient Active Problem List   Diagnosis Date Noted  . Excessive postexertional fatigue 04/02/2017  . Pneumonia due to virus 04/02/2017  . Chronic interstitial cystitis 02/01/2017  . Acute pharyngitis 01/02/2017  . Dysphagia   . Esophageal reflux   . Allergic rhinitis 01/25/2016  . Shortness of breath   . Left hip pain 04/30/2015  . Sciatica 04/30/2015  . Knee pain, right 04/30/2015  . Leukocytes in urine 04/30/2015  . Hyperlipidemia 03/01/2015  . Bilateral hand numbness 03/01/2015  . Pulmonary nodules 02/12/2015  . External hemorrhoids 02/05/2015  . Vaginitis and vulvovaginitis 02/05/2015  . Chronic night sweats 01/08/2015  . Atypical chest pain 01/01/2015  . Chest pain 01/01/2015  . Renal insufficiency 07/16/2014  . Sepsis secondary to UTI ([place]) 02/05/2014  . Grief reaction 12/29/2013  . Urinary hesitancy 12/04/2013  . Abdominal pain, other specified site 10/26/2013  . Headache(784.0) 10/13/2013  . Pain, joint, multiple sites 08/18/2013  . Vasculitis ([place]) 07/29/2013  . ANA positive 07/29/2013  . Other and unspecified hyperlipidemia 07/12/2013  . Contusion, chest wall 04/30/2013  . Malignant neoplasm of lower-outer quadrant of right breast of female, estrogen receptor positive ([place]) 01/03/2013  . Splenic lesion 09/02/2012  . Asthma, allergic 08/05/2012  . GERD (gastroesophageal reflux disease) 06/03/2012  . CAD (coronary artery  disease) 06/03/2012  . Former heavy tobacco smoker 06/03/2012  . Edema 04/08/2012  . Stricture and stenosis of esophagus 10/19/2011  . Constipation - functional 07/21/2011  . Nonspecific (abnormal) findings on radiological and other examination of biliary tract 07/21/2011  . Osteoporosis 04/17/2011  . POSTMENOPAUSAL STATUS 01/27/2011  . CONSTIPATION 11/02/2010  . FIBROCYSTIC BREAST DISEASE, HX OF 10/18/2010  . CAD, NATIVE VESSEL 08/31/2010  . Essential hypertension 08/25/2010  . CAROTID ARTERY DISEASE 08/25/2010  . TOBACCO ABUSE, HX OF 08/25/2010  . GENERALIZED ANXIETY DISORDER 08/03/2010  . FIBROMYALGIA 01/11/2010  . SKIN CANCER, HX OF 01/11/2010    Jeral Pinch PT 08-12-2017, 1:01 PM  [facility], [place], [address] Phone: [phone]   Fax:  [phone]  Name: [name] MRN: [id number] Date of Birth: 1946/01/06

## 2017-07-19 ENCOUNTER — Encounter: Payer: Self-pay | Admitting: Gastroenterology

## 2017-07-19 ENCOUNTER — Ambulatory Visit (AMBULATORY_SURGERY_CENTER): Payer: Medicare Other | Admitting: Gastroenterology

## 2017-07-19 VITALS — BP 120/63 | HR 68 | Temp 99.1°F | Resp 9 | Ht 64.0 in | Wt 117.2 lb

## 2017-07-19 DIAGNOSIS — R935 Abnormal findings on diagnostic imaging of other abdominal regions, including retroperitoneum: Secondary | ICD-10-CM | POA: Diagnosis present

## 2017-07-19 DIAGNOSIS — Z1211 Encounter for screening for malignant neoplasm of colon: Secondary | ICD-10-CM | POA: Diagnosis not present

## 2017-07-19 DIAGNOSIS — K648 Other hemorrhoids: Secondary | ICD-10-CM | POA: Diagnosis not present

## 2017-07-19 DIAGNOSIS — N898 Other specified noninflammatory disorders of vagina: Secondary | ICD-10-CM

## 2017-07-19 MED ORDER — SODIUM CHLORIDE 0.9 % IV SOLN: 500.0000 mL | INTRAVENOUS | Status: AC

## 2017-07-19 NOTE — Op Note (Signed)
[name] Procedure Date: 07/19/2017 2:23 PM MRN: [id number] Endoscopist: Remo Lipps P. Armbruster MD, MD Age: 71 Referring MD:  Date of Birth: [birth year] Gender: Female Account #: [id number] Procedure:                Flexible Sigmoidoscopy Indications:              Abnormal MRI of the GI tract - thin tissue plane                            between vagina / rectal, clinical concern for                            rectovaginal fistula Medicines:                Monitored Anesthesia Care Procedure:                Pre-Anesthesia Assessment:                           - Prior to the procedure, a History and Physical                            was performed, and patient medications and                            allergies were reviewed. The patient's tolerance of                            previous anesthesia was also reviewed. The risks                            and benefits of the procedure and the sedation                            options and risks were discussed with the patient.                            All questions were answered, and informed consent                            was obtained. Prior Anticoagulants: The patient has                            taken no previous anticoagulant or antiplatelet                            agents. ASA Grade Assessment: II - A patient with                            mild systemic disease. After reviewing the risks                            and benefits, the patient was deemed in  satisfactory condition to undergo the procedure.                           After obtaining informed consent, the scope was                            passed under direct vision. The Colonoscope was                            introduced through the anus and advanced to the the                            sigmoid colon. The flexible sigmoidoscopy was                            accomplished without difficulty.  The patient                            tolerated the procedure well. The quality of the                            bowel preparation was fair. Scope In: Scope Out: Findings:                 The perianal exam findings include non-thrombosed                            external hemorrhoids and skin tags.                           A moderate amount of semi-solid stool was found in                            the recto-sigmoid colon and in the sigmoid colon,                            making visualization difficult in these portions of                            the colon. The rectosigmoid was able to be lavaged                            and it appeared normal.                           The rectum appeared normal. No obvious evidence of                            fistulous tract. Complications:            No immediate complications. Estimated blood loss:                            None. Estimated Blood Loss:     Estimated blood loss: none. Impression:               -  Preparation of the colon was fair.                           - Non-thrombosed external hemorrhoids and perianal                            skin tags found on perianal exam.                           - Stool in the sigmoid colon.                           - The rectum appeared normal. No obvious fistulous                            tract appreciated - if a very diminutive fistulous                            tract is present, may be difficult to identify                            endoscopically. Recommendation:           - Discharge patient to home.                           - Resume previous diet.                           - Continue present medications.                           - Follow up with surgery if symptoms persist, may                            consider barium enema to further evaluate for                            possible fistula Remo Lipps P. Armbruster MD, MD 07/19/2017 2:55:22 PM This report has been signed  electronically.

## 2017-07-19 NOTE — Progress Notes (Signed)
Pt's states no medical or surgical changes since previsit or office visit. 

## 2017-07-19 NOTE — Patient Instructions (Signed)
YOU HAD AN ENDOSCOPIC PROCEDURE TODAY AT [facility]:   Refer to the procedure report that was given to you for any specific questions about what was found during the examination.  If the procedure report does not answer your questions, please call your gastroenterologist to clarify.  If you requested that your care partner not be given the details of your procedure findings, then the procedure report has been included in a sealed envelope for you to review at your convenience later.  YOU SHOULD EXPECT: Some feelings of bloating in the abdomen. Passage of more gas than usual.  Walking can help get rid of the air that was put into your GI tract during the procedure and reduce the bloating. If you had a lower endoscopy (such as a colonoscopy or flexible sigmoidoscopy) you may notice spotting of blood in your stool or on the toilet paper. If you underwent a bowel prep for your procedure, you may not have a normal bowel movement for a few days.  Please Note:  You might notice some irritation and congestion in your nose or some drainage.  This is from the oxygen used during your procedure.  There is no need for concern and it should clear up in a day or so.  SYMPTOMS TO REPORT IMMEDIATELY:   Following lower endoscopy (colonoscopy or flexible sigmoidoscopy):  Excessive amounts of blood in the stool  Significant tenderness or worsening of abdominal pains  Swelling of the abdomen that is new, acute  Fever of 100F or higher  For urgent or emergent issues, a gastroenterologist can be reached at any hour by calling [phone].   DIET:  We do recommend a small meal at first, but then you may proceed to your regular diet.  Drink plenty of fluids but you should avoid alcoholic beverages for 24 hours.  ACTIVITY:  You should plan to take it easy for the rest of today and you should NOT DRIVE or use heavy machinery until tomorrow (because of the sedation medicines used during the test).     FOLLOW UP: Our staff will call the number listed on your records the next business day following your procedure to check on you and address any questions or concerns that you may have regarding the information given to you following your procedure. If we do not reach you, we will leave a message.  However, if you are feeling well and you are not experiencing any problems, there is no need to return our call.  We will assume that you have returned to your regular daily activities without incident.   Thank you for letting us take care of your healthcare needs today. If any biopsies were taken you will be contacted by phone or by letter within the next 1-3 weeks.  Please call us at [phone] if you have not heard about the biopsies in 3 weeks.    SIGNATURES/CONFIDENTIALITY: You and/or your care partner have signed paperwork which will be entered into your electronic medical record.  These signatures attest to the fact that that the information above on your After Visit Summary has been reviewed and is understood.  Full responsibility of the confidentiality of this discharge information lies with you and/or your care-partner.  Thank you for letting us take care of your healthcare needs today.

## 2017-07-19 NOTE — Progress Notes (Signed)
Alert and oriented x3, pleased with MAC, report to RN Sarah 

## 2017-07-20 ENCOUNTER — Telehealth: Payer: Self-pay | Admitting: *Deleted

## 2017-07-20 NOTE — Telephone Encounter (Signed)
  Follow up Call-  Call back number 07/19/2017 01/05/2016  Post procedure Call Back phone  # [phone]  Permission to leave phone message Yes Yes  Some recent data might be hidden     Patient questions:  Do you have a fever, pain , or abdominal swelling? No. Pain Score  0 *  Have you tolerated food without any problems? Yes.    Have you been able to return to your normal activities? Yes.    Do you have any questions about your discharge instructions: Diet   No. Medications  No. Follow up visit  No.  Do you have questions or concerns about your Care? No.  Actions: * If pain score is 4 or above: No action needed, pain <4.

## 2017-07-25 ENCOUNTER — Encounter: Payer: Self-pay | Admitting: Physical Therapy

## 2017-07-25 ENCOUNTER — Ambulatory Visit (INDEPENDENT_AMBULATORY_CARE_PROVIDER_SITE_OTHER): Payer: Medicare Other | Admitting: Physical Therapy

## 2017-07-25 DIAGNOSIS — M62838 Other muscle spasm: Secondary | ICD-10-CM | POA: Diagnosis not present

## 2017-07-25 DIAGNOSIS — M6281 Muscle weakness (generalized): Secondary | ICD-10-CM

## 2017-07-25 DIAGNOSIS — M542 Cervicalgia: Secondary | ICD-10-CM | POA: Diagnosis not present

## 2017-07-25 NOTE — Therapy (Signed)
[place], [place], [address] Phone: [phone]   Fax:  [phone]  Physical Therapy Treatment  Patient Details  Name: [name] MRN: [id number] Date of Birth: 1946/05/15 Referring Provider: Dr Myrla Halsted  Encounter Date: 07/25/2017      PT End of Session - 07/25/17 1023    Visit Number 2   Number of Visits 12   Date for PT Re-Evaluation 08/28/17   PT Start Time 1018   PT Stop Time 1116   PT Time Calculation (min) 58 min   Activity Tolerance Patient tolerated treatment well      Past Medical History:  Diagnosis Date  . Allergy   . Anemia   . Anxiety    past hx   . Arthritis   . Asthma   . Atrial flutter ([place])    past history- not current  . CAD (coronary artery disease) CARDIOLOGIST--  DR Angelena Form   mild non-obstructive cad  . Cancer ([place])    right  . Cataract    bilaterally removed   . Chronic constipation   . Chronic kidney disease    interstitial cystitis  . COPD (chronic obstructive pulmonary disease) ([place])   . Depression    past hx   . Family history of malignant hyperthermia    father had this  . Fibromyalgia   . Frequency of urination   . GERD (gastroesophageal reflux disease)   . H/O hiatal hernia   . History of basal cell carcinoma excision    X2  . History of breast cancer ONCOLOGIST-- DR Jana Hakim---  NO RECURRANCE   DX 07/2012;  LOW GRADE DCIS  ER+PR+  ----  S/P RIGHT LUMPECTOMY WITH NEGATIVE MARGINS/   RADIATION ENDED 11/2012  . History of chronic bronchitis   . History of colonic polyps   . History of tachycardia    CONTROLLED  WITH ATENOLOL  . Hyperlipidemia   . Hypertension   . Neuromuscular disorder (HCC)    fibromyalgia  . Osteoporosis   . Pelvic pain   . S/P radiation therapy 11/12/12 - 12/05/12   right Breast  . Sepsis ([place]) 2014   from UTI   . Sinus headache   . Urgency of urination     Past Surgical History:  Procedure Laterality Date  . [place] STUDY N/A 04/03/2016   Procedure: [place] STUDY;  Surgeon: Manus Gunning, MD;  Location: [facility];  Service: Gastroenterology;  Laterality: N/A;  . BREAST LUMPECTOMY Right 10-11-2012   W/ SLN BX  . CARDIAC CATHETERIZATION  09-13-2007  DR Lia Foyer   WELL-PRESERVED LVF/  DIFFUSE SCATTERED CORONARY CALCIFACATION AND ATHEROSCLEROSIS WITHOUT OBSTRUCTION  . CARDIAC CATHETERIZATION  08-04-2010  DR Angelena Form   NON-OBSTRUCTIVE CAD/  pLAD 40%/  oLAD 30%/  mLAD 30%/  pRCA 30%/  EF 60%  . CARDIOVASCULAR STRESS TEST  06-18-2012  DR McALHANY   LOW RISK NUCLEAR STUDY/  SMALL FIXED AREA OF MODERATELY DECREASED UPTAKE IN ANTEROSEPTAL WALL WHICH MAY BE ARTIFACTUAL/  NO ISCHEMIA/  EF 68%  . COLONOSCOPY  09-29-2010  . CYSTOSCOPY    . CYSTOSCOPY WITH HYDRODISTENSION AND BIOPSY N/A 03/06/2014   Procedure: CYSTOSCOPY/HYDRODISTENSION/ INSTILATION OF MARCAINE AND PYRIDIUM;  Surgeon: Ailene Rud, MD;  Location[facility];  Service: Urology;  Laterality: N/A;  . ESOPHAGEAL MANOMETRY N/A 04/03/2016   Procedure: ESOPHAGEAL MANOMETRY (EM);  Surgeon: Manus Gunning, MD;  Location: [facility];  Service: Gastroenterology;  Laterality: N/A;  . NASAL SINUS SURGERY  1985  . ORIF RIGHT ANKLE  FX  2006  . POLYPECTOMY    . REMOVAL VOCAL CORD CYST  FEB 2014  . RIGHT BREAST BX  08-23-2012  . RIGHT HAND SURGERY  X3  LAST ONE 2009   INCLUDES  ORIF RIGHT 5TH FINGER AND REVISION TWICE  . TONSILLECTOMY AND ADENOIDECTOMY  AGE 58  . TOTAL ABDOMINAL HYSTERECTOMY W/ BILATERAL SALPINGOOPHORECTOMY  1982   W/  APPENDECTOMY  . TRANSTHORACIC ECHOCARDIOGRAM  06-24-2012   GRADE I DIASTOLIC DYSFUNCTION/  EF 55-60%/  MILD MR  . UPPER GASTROINTESTINAL ENDOSCOPY      There were no vitals filed for this visit.      Subjective Assessment - 07/25/17 1020    Subjective [name] felt a lot better after the last visit, still feels like there is a little something on the Rt side more between the  shoulder blades now.    Patient Stated Goals reduce pain to base line, reduce the tightness she has been feeling to allow her to reduce her pain medication   Currently in Pain? Yes   Pain Score 6    Pain Location Thoracic   Pain Orientation Right   Pain Descriptors / Indicators Burning;Tightness;Squeezing   Pain Type Acute pain   Pain Onset More than a month ago   Pain Frequency Constant   Aggravating Factors  not sure   Pain Relieving Factors heat and gentle motion                         [facility] Adult PT Treatment/Exercise - 07/25/17 0001      Exercises   Exercises Neck     Neck Exercises: Machines for Strengthening   UBE (Upper Arm Bike) L2 x 5 ' alt FWD/BWD     Neck Exercises: Standing   Other Standing Exercises 2x15 horizontal abduction with green band,    Other Standing Exercises 2x10 row, leaning on ball, with 5# wts     Modalities   Modalities Electrical Stimulation;Moist Heat     Moist Heat Therapy   Number Minutes Moist Heat 15 Minutes   Moist Heat Location Cervical  thoracic     Electrical Stimulation   Electrical Stimulation Location Rt thoracic   Electrical Stimulation Action IFC   Electrical Stimulation Parameters to tolerance   Electrical Stimulation Goals Tone;Pain     Manual Therapy   Manual Therapy Soft tissue mobilization   Soft tissue mobilization rhomboids and thoracic paraspinals     Neck Exercises: Stretches   Other Neck Stretches rhomboid stretches each side           Trigger Point Dry Needling - 07/25/17 1038    Consent Given? Yes   Education Handout Provided No   Muscles Treated Upper Body Rhomboids;Longissimus  Rt   Rhomboids Response Palpable increased muscle length;Twitch response elicited  Rt side    Longissimus Response Palpable increased muscle length;Twitch response elicited  Rt U3-1                   PT Long Term Goals - 07/25/17 1022      PT LONG TERM GOAL #1   Title independent with HEP  (08/28/17)   Status On-going     PT LONG TERM GOAL #2   Title improve Lt cervical rotation =/> 65 degrees (95/4/18)    Status On-going     PT LONG TERM GOAL #3   Title  report 75% improvement in symtoms (08/28/17)   Status On-going     PT LONG TERM GOAL #4   Title improve FOTO =/< 40% limited, CJ level (9/4 /18)    Status On-going     PT LONG TERM GOAL #5   Title demo upper back strength =/> 4+/5    Status On-going             Patient will benefit from skilled therapeutic intervention in order to improve the following deficits and impairments:     Visit Diagnosis: Cervicalgia  Muscle weakness (generalized)  Other muscle spasm     Problem List Patient Active Problem List   Diagnosis Date Noted  . Excessive postexertional fatigue 04/02/2017  . Pneumonia due to virus 04/02/2017  . Chronic interstitial cystitis 02/01/2017  . Acute pharyngitis 01/02/2017  . Dysphagia   . Esophageal reflux   . Allergic rhinitis 01/25/2016  . Shortness of breath   . Left hip pain 04/30/2015  . Sciatica 04/30/2015  . Knee pain, right 04/30/2015  . Leukocytes in urine 04/30/2015  . Hyperlipidemia 03/01/2015  . Bilateral hand numbness 03/01/2015  . Pulmonary nodules 02/12/2015  . External hemorrhoids 02/05/2015  . Vaginitis and vulvovaginitis 02/05/2015  . Chronic night sweats 01/08/2015  . Atypical chest pain 01/01/2015  . Chest pain 01/01/2015  . Renal insufficiency 07/16/2014  . Sepsis secondary to UTI ([place]) 02/05/2014  . Grief reaction 12/29/2013  . Urinary hesitancy 12/04/2013  . Abdominal pain, other specified site 10/26/2013  . Headache(784.0) 10/13/2013  . Pain, joint, multiple sites 08/18/2013  . Vasculitis ([place]) 07/29/2013  . ANA positive 07/29/2013  . Other and unspecified hyperlipidemia 07/12/2013  . Contusion, chest wall 04/30/2013  . Malignant neoplasm of lower-outer quadrant of right breast of female, estrogen receptor positive ([place]) 01/03/2013  . Splenic  lesion 09/02/2012  . Asthma, allergic 08/05/2012  . GERD (gastroesophageal reflux disease) 06/03/2012  . CAD (coronary artery disease) 06/03/2012  . Former heavy tobacco smoker 06/03/2012  . Edema 04/08/2012  . Stricture and stenosis of esophagus 10/19/2011  . Constipation - functional 07/21/2011  . Nonspecific (abnormal) findings on radiological and other examination of biliary tract 07/21/2011  . Osteoporosis 04/17/2011  . POSTMENOPAUSAL STATUS 01/27/2011  . CONSTIPATION 11/02/2010  . FIBROCYSTIC BREAST DISEASE, HX OF 10/18/2010  . CAD, NATIVE VESSEL 08/31/2010  . Essential hypertension 08/25/2010  . CAROTID ARTERY DISEASE 08/25/2010  . TOBACCO ABUSE, HX OF 08/25/2010  . GENERALIZED ANXIETY DISORDER 08/03/2010  . FIBROMYALGIA 01/11/2010  . SKIN CANCER, HX OF 01/11/2010    Jeral Pinch PT  07/25/2017, 11:05 AM  [facility] [place], [place], [address] Phone: [phone]   Fax:  [phone]  Name: [name] MRN: [id number] Date of Birth: [birth year]

## 2017-07-30 ENCOUNTER — Encounter: Payer: Medicare Other | Admitting: Gastroenterology

## 2017-08-07 ENCOUNTER — Telehealth: Payer: Self-pay | Admitting: Gastroenterology

## 2017-08-07 NOTE — Telephone Encounter (Signed)
Spoke to patient, she is still having vaginal discharge. She had contacted Dr. Lucia Gaskins to schedule an appointment, they are requesting records. Faxed over flex sig, ov notes and MRI report to [facility].

## 2017-08-07 NOTE — Telephone Encounter (Signed)
Okay thanks. Dr. Lucia Gaskins can evaluate her - unclear if she has a very small fistula or not. I would consider a barium enema but defer to Dr. Lucia Gaskins. Thanks

## 2017-08-08 ENCOUNTER — Ambulatory Visit (INDEPENDENT_AMBULATORY_CARE_PROVIDER_SITE_OTHER): Payer: Medicare Other | Admitting: Physical Therapy

## 2017-08-08 ENCOUNTER — Encounter: Payer: Self-pay | Admitting: Physical Therapy

## 2017-08-08 DIAGNOSIS — M542 Cervicalgia: Secondary | ICD-10-CM | POA: Diagnosis not present

## 2017-08-08 DIAGNOSIS — M62838 Other muscle spasm: Secondary | ICD-10-CM

## 2017-08-08 DIAGNOSIS — M6281 Muscle weakness (generalized): Secondary | ICD-10-CM

## 2017-08-08 NOTE — Therapy (Signed)
[place], [place], [address] Phone: [phone]   Fax:  [phone]  Physical Therapy Treatment  Patient Details  Name: [name] MRN: [id number] Date of Birth: May 03, 1946 Referring Provider: Dr Myrla Halsted  Encounter Date: 08/08/2017      PT End of Session - 08/08/17 0933    Visit Number 3   Number of Visits 12   Date for PT Re-Evaluation 08/28/17   PT Start Time 0934   PT Stop Time (P)  1028   PT Time Calculation (min) (P)  54 min      Past Medical History:  Diagnosis Date  . Allergy   . Anemia   . Anxiety    past hx   . Arthritis   . Asthma   . Atrial flutter ([place])    past history- not current  . CAD (coronary artery disease) CARDIOLOGIST--  DR Angelena Form   mild non-obstructive cad  . Cancer ([place])    right  . Cataract    bilaterally removed   . Chronic constipation   . Chronic kidney disease    interstitial cystitis  . COPD (chronic obstructive pulmonary disease) ([place])   . Depression    past hx   . Family history of malignant hyperthermia    father had this  . Fibromyalgia   . Frequency of urination   . GERD (gastroesophageal reflux disease)   . H/O hiatal hernia   . History of basal cell carcinoma excision    X2  . History of breast cancer ONCOLOGIST-- DR Jana Hakim---  NO RECURRANCE   DX 07/2012;  LOW GRADE DCIS  ER+PR+  ----  S/P RIGHT LUMPECTOMY WITH NEGATIVE MARGINS/   RADIATION ENDED 11/2012  . History of chronic bronchitis   . History of colonic polyps   . History of tachycardia    CONTROLLED  WITH ATENOLOL  . Hyperlipidemia   . Hypertension   . Neuromuscular disorder (HCC)    fibromyalgia  . Osteoporosis   . Pelvic pain   . S/P radiation therapy 11/12/12 - 12/05/12   right Breast  . Sepsis ([place]) 2014   from UTI   . Sinus headache   . Urgency of urination     Past Surgical History:  Procedure Laterality Date  . [place] STUDY N/A 04/03/2016   Procedure:  [place] STUDY;  Surgeon: Manus Gunning, MD;  [facility]: [facility];  Service: Gastroenterology;  Laterality: N/A;  . BREAST LUMPECTOMY Right 10-11-2012   W/ SLN BX  . CARDIAC CATHETERIZATION  09-13-2007  DR Lia Foyer   WELL-PRESERVED LVF/  DIFFUSE SCATTERED CORONARY CALCIFACATION AND ATHEROSCLEROSIS WITHOUT OBSTRUCTION  . CARDIAC CATHETERIZATION  08-04-2010  DR Angelena Form   NON-OBSTRUCTIVE CAD/  pLAD 40%/  oLAD 30%/  mLAD 30%/  pRCA 30%/  EF 60%  . CARDIOVASCULAR STRESS TEST  06-18-2012  DR McALHANY   LOW RISK NUCLEAR STUDY/  SMALL FIXED AREA OF MODERATELY DECREASED UPTAKE IN ANTEROSEPTAL WALL WHICH MAY BE ARTIFACTUAL/  NO ISCHEMIA/  EF 68%  . COLONOSCOPY  09-29-2010  . CYSTOSCOPY    . CYSTOSCOPY WITH HYDRODISTENSION AND BIOPSY N/A 03/06/2014   Procedure: CYSTOSCOPY/HYDRODISTENSION/ INSTILATION OF MARCAINE AND PYRIDIUM;  Surgeon: Ailene Rud, MD;  Location[facility];  Service: Urology;  Laterality: N/A;  . ESOPHAGEAL MANOMETRY N/A 04/03/2016   Procedure: ESOPHAGEAL MANOMETRY (EM);  Surgeon: Manus Gunning, MD;  [facility]: [facility];  Service: Gastroenterology;  Laterality: N/A;  .  NASAL SINUS SURGERY  1985  . ORIF RIGHT ANKLE  FX  2006  . POLYPECTOMY    . REMOVAL VOCAL CORD CYST  FEB 2014  . RIGHT BREAST BX  08-23-2012  . RIGHT HAND SURGERY  X3  LAST ONE 2009   INCLUDES  ORIF RIGHT 5TH FINGER AND REVISION TWICE  . TONSILLECTOMY AND ADENOIDECTOMY  AGE 71  . TOTAL ABDOMINAL HYSTERECTOMY W/ BILATERAL SALPINGOOPHORECTOMY  1982   W/  APPENDECTOMY  . TRANSTHORACIC ECHOCARDIOGRAM  06-24-2012   GRADE I DIASTOLIC DYSFUNCTION/  EF 55-60%/  MILD MR  . UPPER GASTROINTESTINAL ENDOSCOPY      There were no vitals filed for this visit.      Subjective Assessment - 08/08/17 0935    Subjective [name] reports she had a great vacation, went parasailing.  She was ok in her neck at first however then the pain returned and she is having stiffness.    Patient  Stated Goals reduce pain to base line, reduce the tightness she has been feeling to allow her to reduce her pain medication   Currently in Pain? Yes   Pain Score 5    Pain Orientation Right;Left   Pain Descriptors / Indicators Tightness;Throbbing;Aching   Pain Type Acute pain   Pain Onset More than a month ago   Pain Frequency Constant   Aggravating Factors  doing a lot over vacation.    Pain Relieving Factors tylenol             [facility] PT Assessment - 08/08/17 0001      Assessment   Medical Diagnosis Musculoskeletal neck pain     AROM   Cervical - Right Side Bend 30   Cervical - Left Side Bend 38   Cervical - Right Rotation 82   Cervical - Left Rotation 65     Strength   Right/Left Shoulder --  ER 5/5, abduct Rt 5/5, Lt 5-/5                     [facility] Adult PT Treatment/Exercise - 08/08/17 0001      Neck Exercises: Machines for Strengthening   UBE (Upper Arm Bike) L2 x 3' BWD only     Modalities   Modalities Electrical Stimulation;Moist Heat     Moist Heat Therapy   Number Minutes Moist Heat 15 Minutes   Moist Heat Location Cervical  thoracic     Electrical Stimulation   Electrical Stimulation Location bilat cervical and thoracic   Electrical Stimulation Action IFC   Electrical Stimulation Parameters to tolerance   Electrical Stimulation Goals Tone;Pain     Manual Therapy   Manual Therapy Soft tissue mobilization   Soft tissue mobilization bilat cervical paraspinals and cervical and thoracic parapsinals STM     Neck Exercises: Stretches   Other Neck Stretches low and mid doorway stretch          Trigger Point Dry Needling - 08/08/17 0942    Consent Given? Yes   Education Handout Provided No   Muscles Treated Upper Body Oblique capitus;Suboccipitals muscle group;Upper trapezius;Longissimus   Upper Trapezius Response Palpable increased muscle length;Twitch reponse elicited  bilat   Oblique Capitus Response Palpable increased muscle  length;Twitch response elicited  bilat   SubOccipitals Response Palpable increased muscle length;Twitch response elicited  bilat   Longissimus Response Palpable increased muscle length;Twitch response elicited  bilat O3-J0                   PT  Long Term Goals - 08/08/17 3662      PT LONG TERM GOAL #1   Title independent with HEP (08/28/17)   Status On-going     PT LONG TERM GOAL #2   Title improve Lt cervical rotation =/> 65 degrees (95/4/18)    Status Partially Met     PT LONG TERM GOAL #3   Title report 75% improvement in symtoms (08/28/17)   Status On-going     PT LONG TERM GOAL #4   Title improve FOTO =/< 40% limited, CJ level (9/4 /18)      PT LONG TERM GOAL #5   Title demo upper back strength =/> 4+/5    Status On-going               Plan - 08/08/17 1046    Clinical Impression Statement [name] returned from vacation with some increased pain in her neck and back. Responded great to treatment today and measured the best she has with cervical ROM. Paritally meeting her goals.  Continues with tightness in her cervical spine,posutural changes and decreased upper bck strength   Rehab Potential Good   PT Frequency 2x / week   PT Treatment/Interventions Dry needling;Taping;Manual techniques;Therapeutic exercise;Moist Heat;Ultrasound;Cryotherapy;Electrical Stimulation;Iontophoresis '4mg'$ /ml Dexamethasone;Patient/family education;Passive range of motion   PT Next Visit Plan Cont with manual work and add in more postural re-ed work.    Consulted and Agree with Plan of Care Patient      Patient will benefit from skilled therapeutic intervention in order to improve the following deficits and impairments:  Decreased range of motion, Increased muscle spasms, Pain, Postural dysfunction, Decreased strength  Visit Diagnosis: Cervicalgia  Muscle weakness (generalized)  Other muscle spasm     Problem List Patient Active Problem List   Diagnosis Date Noted  .  Excessive postexertional fatigue 04/02/2017  . Pneumonia due to virus 04/02/2017  . Chronic interstitial cystitis 02/01/2017  . Acute pharyngitis 01/02/2017  . Dysphagia   . Esophageal reflux   . Allergic rhinitis 01/25/2016  . Shortness of breath   . Left hip pain 04/30/2015  . Sciatica 04/30/2015  . Knee pain, right 04/30/2015  . Leukocytes in urine 04/30/2015  . Hyperlipidemia 03/01/2015  . Bilateral hand numbness 03/01/2015  . Pulmonary nodules 02/12/2015  . External hemorrhoids 02/05/2015  . Vaginitis and vulvovaginitis 02/05/2015  . Chronic night sweats 01/08/2015  . Atypical chest pain 01/01/2015  . Chest pain 01/01/2015  . Renal insufficiency 07/16/2014  . Sepsis secondary to UTI ([place]) 02/05/2014  . Grief reaction 12/29/2013  . Urinary hesitancy 12/04/2013  . Abdominal pain, other specified site 10/26/2013  . Headache(784.0) 10/13/2013  . Pain, joint, multiple sites 08/18/2013  . Vasculitis ([place]) 07/29/2013  . ANA positive 07/29/2013  . Other and unspecified hyperlipidemia 07/12/2013  . Contusion, chest wall 04/30/2013  . Malignant neoplasm of lower-outer quadrant of right breast of female, estrogen receptor positive ([place]) 01/03/2013  . Splenic lesion 09/02/2012  . Asthma, allergic 08/05/2012  . GERD (gastroesophageal reflux disease) 06/03/2012  . CAD (coronary artery disease) 06/03/2012  . Former heavy tobacco smoker 06/03/2012  . Edema 04/08/2012  . Stricture and stenosis of esophagus 10/19/2011  . Constipation - functional 07/21/2011  . Nonspecific (abnormal) findings on radiological and other examination of biliary tract 07/21/2011  . Osteoporosis 04/17/2011  . POSTMENOPAUSAL STATUS 01/27/2011  . CONSTIPATION 11/02/2010  . FIBROCYSTIC BREAST DISEASE, HX OF 10/18/2010  . CAD, NATIVE VESSEL 08/31/2010  . Essential hypertension 08/25/2010  . CAROTID ARTERY DISEASE 08/25/2010  .  TOBACCO ABUSE, HX OF 08/25/2010  . GENERALIZED ANXIETY DISORDER 08/03/2010  .  FIBROMYALGIA 01/11/2010  . SKIN CANCER, HX OF 01/11/2010    [name] PT 08/08/2017, 10:50 AM  [facility] [place], [place], [address] Phone: [phone]   Fax:  [phone]  Name: [name] MRN: [id number] Date of Birth: 1946/12/06

## 2017-08-13 ENCOUNTER — Ambulatory Visit (AMBULATORY_SURGERY_CENTER): Payer: Medicare Other | Admitting: Gastroenterology

## 2017-08-13 ENCOUNTER — Ambulatory Visit (INDEPENDENT_AMBULATORY_CARE_PROVIDER_SITE_OTHER)
Admission: RE | Admit: 2017-08-13 | Discharge: 2017-08-13 | Disposition: A | Discharge status: Home / Self Care | Payer: Medicare Other | Source: Ambulatory Visit | Attending: Gastroenterology | Admitting: Gastroenterology

## 2017-08-13 ENCOUNTER — Other Ambulatory Visit: Payer: Self-pay

## 2017-08-13 ENCOUNTER — Encounter: Payer: Self-pay | Admitting: Gastroenterology

## 2017-08-13 ENCOUNTER — Other Ambulatory Visit: Payer: Self-pay | Admitting: Gastroenterology

## 2017-08-13 VITALS — BP 136/73 | HR 76 | Temp 98.7°F | Resp 11 | Ht 64.0 in | Wt 116.0 lb

## 2017-08-13 DIAGNOSIS — Z8719 Personal history of other diseases of the digestive system: Secondary | ICD-10-CM

## 2017-08-13 DIAGNOSIS — G8918 Other acute postprocedural pain: Secondary | ICD-10-CM

## 2017-08-13 DIAGNOSIS — K2970 Gastritis, unspecified, without bleeding: Secondary | ICD-10-CM

## 2017-08-13 DIAGNOSIS — R1310 Dysphagia, unspecified: Secondary | ICD-10-CM | POA: Diagnosis not present

## 2017-08-13 DIAGNOSIS — K2950 Unspecified chronic gastritis without bleeding: Secondary | ICD-10-CM | POA: Diagnosis not present

## 2017-08-13 DIAGNOSIS — Z9889 Other specified postprocedural states: Secondary | ICD-10-CM

## 2017-08-13 DIAGNOSIS — R1319 Other dysphagia: Secondary | ICD-10-CM

## 2017-08-13 DIAGNOSIS — J449 Chronic obstructive pulmonary disease, unspecified: Secondary | ICD-10-CM | POA: Diagnosis not present

## 2017-08-13 DIAGNOSIS — K2990 Gastroduodenitis, unspecified, without bleeding: Secondary | ICD-10-CM | POA: Diagnosis not present

## 2017-08-13 DIAGNOSIS — I2510 Atherosclerotic heart disease of native coronary artery without angina pectoris: Secondary | ICD-10-CM | POA: Diagnosis not present

## 2017-08-13 DIAGNOSIS — R079 Chest pain, unspecified: Secondary | ICD-10-CM | POA: Diagnosis not present

## 2017-08-13 MED ORDER — SODIUM CHLORIDE 0.9 % IV SOLN: 500.0000 mL | INTRAVENOUS | Status: DC

## 2017-08-13 NOTE — Progress Notes (Signed)
Patient had EGD with dilation. In recovery bay she complains of 6/10 pain in her throat. No shortness of breath or chest pain. I suspect she has discomfort from the superficial wrent noted during relook endoscopy at the [facility], but given symptoms have persisted recommend CXR to ensure no free air, although I think perforation is very unlikely. Will send for CXR and evaluate post-xray.

## 2017-08-13 NOTE — Progress Notes (Signed)
Patient returned from CXR, xray is normal. Sprayed her throat with cetacaine spray, it completely resolved her symptoms. Suspect she has discomfort from [facility] dilation site versus superficial trauma to the posterior pharynx. She is stable to go home. Use topical cepacol lozenges, etc, for throat discomfort. Precautions given - if pain worsens, shortness of breath, chest pain, fever, etc, she needs to contact us for re-evaluation.

## 2017-08-13 NOTE — Op Note (Signed)
[facility] Patient Name: [name] Procedure Date: 08/13/2017 10:01 AM MRN: [id number] Endoscopist: Remo Lipps P. Jordi Lacko MD, MD Age: 71 Referring MD:  Date of Birth: 22-Oct-1946 Gender: Female Account #: [id number] Procedure:                Upper GI endoscopy Indications:              Dysphagia Medicines:                Monitored Anesthesia Care Procedure:                Pre-Anesthesia Assessment:                           - Prior to the procedure, a History and Physical                            was performed, and patient medications and                            allergies were reviewed. The patient's tolerance of                            previous anesthesia was also reviewed. The risks                            and benefits of the procedure and the sedation                            options and risks were discussed with the patient.                            All questions were answered, and informed consent                            was obtained. Prior Anticoagulants: The patient has                            taken aspirin, last dose was 1 day prior to                            procedure. ASA Grade Assessment: III - A patient                            with severe systemic disease. After reviewing the                            risks and benefits, the patient was deemed in                            satisfactory condition to undergo the procedure.                           After obtaining informed consent, the endoscope was  passed under direct vision. Throughout the                            procedure, the patient's blood pressure, pulse, and                            oxygen saturations were monitored continuously. The                            Model GIF-HQ190 [id number]) scope was introduced                            through the mouth, and advanced to the second part                            of duodenum. The upper GI endoscopy  was                            accomplished without difficulty. The patient                            tolerated the procedure well. Scope In: Scope Out: Findings:                 Esophagogastric landmarks were identified: the                            gastroesophageal junction was found at 37 cm, the                            lower esophageal sphincter was found at 37 cm and                            the upper extent of the gastric folds was found at                            39 cm from the incisors.                           A 2 cm hiatal hernia was present.                           The exam of the esophagus was otherwise normal. No                            stenosis / stricture noted.                           Initially attempt at 9m Maloney dilation was                            performed but met with resistance at the UES so was  withdrawl from the patient. A guidewire was placed                            and the scope was withdrawn. Dilation was performed                            in the entire esophagus with a Savary dilator with                            mild resistance at 17 mm. Relook endoscopy showed a                            mucosal wrent near the UES, suspect small stricture                            was present at this site. Biopsies were taken with                            a cold forceps in the entire esophagus for                            histology to rule out EoE otherwise.                           Patchy mild inflammation was found in the gastric                            antrum. Biopsies were taken with a cold forceps for                            Helicobacter pylori testing.                           The exam of the stomach was otherwise normal.                           The duodenal bulb and second portion of the                            duodenum were normal. Complications:            No immediate complications.  Estimated blood loss:                            Minimal. Estimated Blood Loss:     Estimated blood loss was minimal. Impression:               - Esophagogastric landmarks identified.                           - 2 cm hiatal hernia.                           - Normal esophagus otherwise - dilated with 89m  Savory with small wrent at the UES, suspect subtle                            stenosis was present at that area. Biopsies also                            taken to assess for eosinophilic esophagitis.                           - Mild Gastritis. Biopsied.                           - Normal duodenal bulb and second portion of the                            duodenum. Recommendation:           - Patient has a contact number available for                            emergencies. The signs and symptoms of potential                            delayed complications were discussed with the                            patient. Return to normal activities tomorrow.                            Written discharge instructions were provided to the                            patient.                           - Resume previous diet.                           - Continue present medications.                           - Await pathology results and course following                            dilation Jaymason Ledesma P. Caitlin Ainley MD, MD 08/13/2017 10:25:39 AM This report has been signed electronically.

## 2017-08-13 NOTE — Progress Notes (Signed)
Called to room to assist during endoscopic procedure.  Patient ID and intended procedure confirmed with present staff. Received instructions for my participation in the procedure from the performing physician.  

## 2017-08-13 NOTE — Patient Instructions (Signed)
Biopsies taken today. Result letter in your mail in 2-3 weeks. Dilation done diet. Follow dilation diet. See handout given on dilation diet. Call us with any questions or concerns. Thank you!   YOU HAD AN ENDOSCOPIC PROCEDURE TODAY AT [facility]:   Refer to the procedure report that was given to you for any specific questions about what was found during the examination.  If the procedure report does not answer your questions, please call your gastroenterologist to clarify.  If you requested that your care partner not be given the details of your procedure findings, then the procedure report has been included in a sealed envelope for you to review at your convenience later.  YOU SHOULD EXPECT: Some feelings of bloating in the abdomen. Passage of more gas than usual.  Walking can help get rid of the air that was put into your GI tract during the procedure and reduce the bloating. If you had a lower endoscopy (such as a colonoscopy or flexible sigmoidoscopy) you may notice spotting of blood in your stool or on the toilet paper. If you underwent a bowel prep for your procedure, you may not have a normal bowel movement for a few days.  Please Note:  You might notice some irritation and congestion in your nose or some drainage.  This is from the oxygen used during your procedure.  There is no need for concern and it should clear up in a day or so.  SYMPTOMS TO REPORT IMMEDIATELY:   Following upper endoscopy (EGD)  Vomiting of blood or coffee ground material  New chest pain or pain under the shoulder blades  Painful or persistently difficult swallowing  New shortness of breath  Fever of 100F or higher  Black, tarry-looking stools  For urgent or emergent issues, a gastroenterologist can be reached at any hour by calling [phone].   DIET:  Nothing by mouth for 1 hour, then clear liquid diet for 1 hour, then soft diet rest of today. Resume your current diet tomorrow.  Drink  plenty of fluids but you should avoid alcoholic beverages for 24 hours.  ACTIVITY:  You should plan to take it easy for the rest of today and you should NOT DRIVE or use heavy machinery until tomorrow (because of the sedation medicines used during the test).    FOLLOW UP: Our staff will call the number listed on your records the next business day following your procedure to check on you and address any questions or concerns that you may have regarding the information given to you following your procedure. If we do not reach you, we will leave a message.  However, if you are feeling well and you are not experiencing any problems, there is no need to return our call.  We will assume that you have returned to your regular daily activities without incident.  If any biopsies were taken you will be contacted by phone or by letter within the next 1-3 weeks.  Please call us at [phone] if you have not heard about the biopsies in 3 weeks.    SIGNATURES/CONFIDENTIALITY: You and/or your care partner have signed paperwork which will be entered into your electronic medical record.  These signatures attest to the fact that that the information above on your After Visit Summary has been reviewed and is understood.  Full responsibility of the confidentiality of this discharge information lies with you and/or your care-partner.

## 2017-08-13 NOTE — Progress Notes (Signed)
To recovery, report to RN, VSS. 

## 2017-08-14 ENCOUNTER — Telehealth: Payer: Self-pay | Admitting: *Deleted

## 2017-08-14 NOTE — Telephone Encounter (Signed)
  Follow up Call-  Call back number 08/13/2017 07/19/2017 01/05/2016  Post procedure Call Back phone  # [phone]  Permission to leave phone message Yes Yes Yes  Some recent data might be hidden     Patient questions:  Do you have a fever, pain , or abdominal swelling? No. Pain Score  0 *  Have you tolerated food without any problems? Yes.    Have you been able to return to your normal activities? Yes.    Do you have any questions about your discharge instructions: Diet   No. Medications  No. Follow up visit  No.  Do you have questions or concerns about your Care? No.  Actions: * If pain score is 4 or above: No action needed, pain <4.  Patient states her throat is sore but not like yesterday. She is drinking fluids. Patient denies any signs of bleeding.

## 2017-08-15 ENCOUNTER — Encounter: Payer: Self-pay | Admitting: Physical Therapy

## 2017-08-15 ENCOUNTER — Ambulatory Visit (INDEPENDENT_AMBULATORY_CARE_PROVIDER_SITE_OTHER): Payer: Medicare Other | Admitting: Physical Therapy

## 2017-08-15 DIAGNOSIS — M6281 Muscle weakness (generalized): Secondary | ICD-10-CM

## 2017-08-15 DIAGNOSIS — M542 Cervicalgia: Secondary | ICD-10-CM

## 2017-08-15 DIAGNOSIS — M62838 Other muscle spasm: Secondary | ICD-10-CM

## 2017-08-15 NOTE — Therapy (Signed)
[facility] [place], [place], [address] Phone: [phone]   Fax:  [phone]  Physical Therapy Treatment  Patient Details  Name: [name] MRN: [id number] Date of Birth: Sep 09, 1946 Referring Provider: Dr Myrla Halsted  Encounter Date: 08/15/2017      PT End of Session - 08/15/17 1017    Visit Number 4   Number of Visits 12   Date for PT Re-Evaluation 08/28/17   PT Start Time 1017   PT Stop Time 1110   PT Time Calculation (min) 53 min      Past Medical History:  Diagnosis Date  . Allergy   . Anemia   . Anxiety    past hx   . Arthritis   . Asthma   . Atrial flutter ([place])    past history- not current  . CAD (coronary artery disease) CARDIOLOGIST--  DR Angelena Form   mild non-obstructive cad  . Cancer ([place])    right  . Cataract    bilaterally removed   . Chronic constipation   . Chronic kidney disease    interstitial cystitis  . COPD (chronic obstructive pulmonary disease) ([place])   . Depression    past hx   . Family history of malignant hyperthermia    father had this  . Fibromyalgia   . Frequency of urination   . GERD (gastroesophageal reflux disease)   . H/O hiatal hernia   . History of basal cell carcinoma excision    X2  . History of breast cancer ONCOLOGIST-- DR Jana Hakim---  NO RECURRANCE   DX 07/2012;  LOW GRADE DCIS  ER+PR+  ----  S/P RIGHT LUMPECTOMY WITH NEGATIVE MARGINS/   RADIATION ENDED 11/2012  . History of chronic bronchitis   . History of colonic polyps   . History of tachycardia    CONTROLLED  WITH ATENOLOL  . Hyperlipidemia   . Hypertension   . Neuromuscular disorder ([facility])    fibromyalgia  . Osteoporosis   . Pelvic pain   . S/P radiation therapy 11/12/12 - 12/05/12   right Breast  . Sepsis ([place]) 2014   from UTI   . Sinus headache   . Urgency of urination     Past Surgical History:  Procedure Laterality Date  . [place] STUDY N/A 04/03/2016   Procedure: [place]  STUDY;  Surgeon: Manus Gunning, MD;  Location: [facility];  Service: Gastroenterology;  Laterality: N/A;  . BREAST LUMPECTOMY Right 10-11-2012   W/ SLN BX  . CARDIAC CATHETERIZATION  09-13-2007  DR Lia Foyer   WELL-PRESERVED LVF/  DIFFUSE SCATTERED CORONARY CALCIFACATION AND ATHEROSCLEROSIS WITHOUT OBSTRUCTION  . CARDIAC CATHETERIZATION  08-04-2010  DR Angelena Form   NON-OBSTRUCTIVE CAD/  pLAD 40%/  oLAD 30%/  mLAD 30%/  pRCA 30%/  EF 60%  . CARDIOVASCULAR STRESS TEST  06-18-2012  DR McALHANY   LOW RISK NUCLEAR STUDY/  SMALL FIXED AREA OF MODERATELY DECREASED UPTAKE IN ANTEROSEPTAL WALL WHICH MAY BE ARTIFACTUAL/  NO ISCHEMIA/  EF 68%  . COLONOSCOPY  09-29-2010  . CYSTOSCOPY    . CYSTOSCOPY WITH HYDRODISTENSION AND BIOPSY N/A 03/06/2014   Procedure: CYSTOSCOPY/HYDRODISTENSION/ INSTILATION OF MARCAINE AND PYRIDIUM;  Surgeon: Ailene Rud, MD;  Location: [facility];  Service: Urology;  Laterality: N/A;  . ESOPHAGEAL MANOMETRY N/A 04/03/2016   Procedure: ESOPHAGEAL MANOMETRY (EM);  Surgeon: Manus Gunning, MD;  Location: [facility];  Service: Gastroenterology;  Laterality: N/A;  . NASAL SINUS SURGERY  1985  . ORIF RIGHT ANKLE  FX  2006  . POLYPECTOMY    . REMOVAL VOCAL CORD CYST  FEB 2014  . RIGHT BREAST BX  08-23-2012  . RIGHT HAND SURGERY  X3  LAST ONE 2009   INCLUDES  ORIF RIGHT 5TH FINGER AND REVISION TWICE  . TONSILLECTOMY AND ADENOIDECTOMY  AGE 71  . TOTAL ABDOMINAL HYSTERECTOMY W/ BILATERAL SALPINGOOPHORECTOMY  1982   W/  APPENDECTOMY  . TRANSTHORACIC ECHOCARDIOGRAM  06-24-2012   GRADE I DIASTOLIC DYSFUNCTION/  EF 55-60%/  MILD MR  . UPPER GASTROINTESTINAL ENDOSCOPY      There were no vitals filed for this visit.      Subjective Assessment - 08/15/17 1018    Subjective Carling states she isn't doing to good today. Had endoscopy on Monday and had trouble waking up so now her throat is really sore and she feels "off", neck is creaking and not sure  if its from the position on the table.    Patient Stated Goals reduce pain to base line, reduce the tightness she has been feeling to allow her to reduce her pain medication   Currently in Pain? Yes   Pain Score 6    Pain Location Neck   Pain Orientation Left;Right   Pain Descriptors / Indicators Aching   Pain Type Acute pain   Pain Onset 1 to 4 weeks ago   Pain Frequency Constant   Aggravating Factors  having the endoscopy this week   Pain Relieving Factors hasn't tried as she is off from the procedure on Monday.             [facility] PT Assessment - 08/15/17 0001      Assessment   Medical Diagnosis Musculoskeletal neck pain     AROM   Cervical Flexion 44   Cervical Extension 54   Cervical - Right Side Bend 38   Cervical - Left Side Bend 36   Cervical - Right Rotation 65   Cervical - Left Rotation 62                     [facility] Adult PT Treatment/Exercise - 08/15/17 0001      Neck Exercises: Machines for Strengthening   UBE (Upper Arm Bike) L3 x 3' BWD only     Neck Exercises: Supine   Neck Retraction 20 reps  into ball off edge of mat, focus on eccentric motion   Other Supine Exercise 20 reps shoulders presses towards the ball.      Modalities   Modalities Electrical Stimulation;Moist Heat     Moist Heat Therapy   Number Minutes Moist Heat 15 Minutes   Moist Heat Location Cervical     Electrical Stimulation   Electrical Stimulation Location bilat cervical and thoracic   Electrical Stimulation Action IFC   Electrical Stimulation Parameters  to tolerance   Electrical Stimulation Goals Tone;Pain     Manual Therapy   Manual Therapy Soft tissue mobilization   Soft tissue mobilization STM to bilat cervical paraspinals, peri-occiput and upper trap Rt           Trigger Point Dry Needling - 08/15/17 1034    Consent Given? Yes   Education Handout Provided No   Muscles Treated Upper Body Longissimus;Oblique capitus;Upper trapezius   Upper Trapezius  Response Palpable increased muscle length;Twitch reponse elicited  Rt   Oblique Capitus Response Palpable increased muscle length;Twitch response elicited  Rt   Longissimus Response Palpable increased muscle length;Twitch response  elicited  bilat cervical                    PT Long Term Goals - 08/15/17 1021      PT LONG TERM GOAL #1   Title independent with HEP (08/28/17)   Status On-going     PT LONG TERM GOAL #2   Title improve Lt cervical rotation =/> 65 degrees (95/4/18)    Status Partially Met     PT LONG TERM GOAL #3   Title report 75% improvement in symtoms (08/28/17)   Status On-going     PT LONG TERM GOAL #4   Title improve FOTO =/< 40% limited, CJ level (9/4 /18)    Status On-going     PT LONG TERM GOAL #5   Title demo upper back strength =/> 4+/5    Status On-going               Plan - 08/15/17 1251    Clinical Impression Statement Viriginia had a fair amount of tightness in her Rt cervical muscles, some on the left side.  She had a procedure done on Monday and was intubated and flared up her pain. Her ROM was improved after treatment.  Slow progress to goals with slight set back this week after procedure.    Rehab Potential Good   PT Frequency 2x / week   PT Duration 6 weeks   PT Treatment/Interventions Dry needling;Taping;Manual techniques;Therapeutic exercise;Moist Heat;Ultrasound;Cryotherapy;Electrical Stimulation;Iontophoresis 4m/ml Dexamethasone;Patient/family education;Passive range of motion   PT Next Visit Plan Cont with manual work and add in more postural re-ed work.    Consulted and Agree with Plan of Care Patient      Patient will benefit from skilled therapeutic intervention in order to improve the following deficits and impairments:  Decreased range of motion, Increased muscle spasms, Pain, Postural dysfunction, Decreased strength  Visit Diagnosis: Cervicalgia  Muscle weakness (generalized)  Other muscle spasm     Problem  List Patient Active Problem List   Diagnosis Date Noted  . Excessive postexertional fatigue 04/02/2017  . Pneumonia due to virus 04/02/2017  . Chronic interstitial cystitis 02/01/2017  . Acute pharyngitis 01/02/2017  . Dysphagia   . Esophageal reflux   . Allergic rhinitis 01/25/2016  . Shortness of breath   . Left hip pain 04/30/2015  . Sciatica 04/30/2015  . Knee pain, right 04/30/2015  . Leukocytes in urine 04/30/2015  . Hyperlipidemia 03/01/2015  . Bilateral hand numbness 03/01/2015  . Pulmonary nodules 02/12/2015  . External hemorrhoids 02/05/2015  . Vaginitis and vulvovaginitis 02/05/2015  . Chronic night sweats 01/08/2015  . Atypical chest pain 01/01/2015  . Chest pain 01/01/2015  . Renal insufficiency 07/16/2014  . Sepsis secondary to UTI ([place] 02/05/2014  . Grief reaction 12/29/2013  . Urinary hesitancy 12/04/2013  . Abdominal pain, other specified site 10/26/2013  . Headache(784.0) 10/13/2013  . Pain, joint, multiple sites 08/18/2013  . Vasculitis ([place] 07/29/2013  . ANA positive 07/29/2013  . Other and unspecified hyperlipidemia 07/12/2013  . Contusion, chest wall 04/30/2013  . Malignant neoplasm of lower-outer quadrant of right breast of female, estrogen receptor positive ([place] 01/03/2013  . Splenic lesion 09/02/2012  . Asthma, allergic 08/05/2012  . GERD (gastroesophageal reflux disease) 06/03/2012  . CAD (coronary artery disease) 06/03/2012  . Former heavy tobacco smoker 06/03/2012  . Edema 04/08/2012  . Stricture and stenosis of esophagus 10/19/2011  . Constipation - functional 07/21/2011  . Nonspecific (abnormal) findings on radiological and other examination of  biliary tract 07/21/2011  . Osteoporosis 04/17/2011  . POSTMENOPAUSAL STATUS 01/27/2011  . CONSTIPATION 11/02/2010  . FIBROCYSTIC BREAST DISEASE, HX OF 10/18/2010  . CAD, NATIVE VESSEL 08/31/2010  . Essential hypertension 08/25/2010  . CAROTID ARTERY DISEASE 08/25/2010  . TOBACCO ABUSE, HX OF  08/25/2010  . GENERALIZED ANXIETY DISORDER 08/03/2010  . FIBROMYALGIA 01/11/2010  . SKIN CANCER, HX OF 01/11/2010    Jeral Pinch PT 08/15/2017, 12:53 PM  [facility] [place] [address], [place], [address] Phone: [phone]   Fax:  [phone]  Name: Diala Waxman MRN: [id number] Date of Birth: 1946-11-06

## 2017-08-17 ENCOUNTER — Encounter: Payer: Self-pay | Admitting: Gastroenterology

## 2017-08-22 ENCOUNTER — Ambulatory Visit (INDEPENDENT_AMBULATORY_CARE_PROVIDER_SITE_OTHER): Payer: Medicare Other | Admitting: Physical Therapy

## 2017-08-22 ENCOUNTER — Encounter: Payer: Self-pay | Admitting: Physical Therapy

## 2017-08-22 DIAGNOSIS — M6281 Muscle weakness (generalized): Secondary | ICD-10-CM | POA: Diagnosis not present

## 2017-08-22 DIAGNOSIS — M62838 Other muscle spasm: Secondary | ICD-10-CM | POA: Diagnosis not present

## 2017-08-22 DIAGNOSIS — M542 Cervicalgia: Secondary | ICD-10-CM

## 2017-08-22 NOTE — Patient Instructions (Addendum)
Thoracic Lift    Press shoulders down. Then lift mid-thoracic spine (area between the shoulder blades). Lift the breastbone slightly. Hold _10__ seconds. Relax. Repeat __10_ times. Perform 3-4 a week.    Elbow Press    Interlace fingers; bring hands underneath head. Press elbows down. Hold __10_ seconds. Relax arms. Repeat _10__ times. Repeat 3-4 times a week.

## 2017-08-22 NOTE — Therapy (Signed)
[place], [place], [address] Phone: [phone]   Fax:  [phone]  Physical Therapy Treatment  Patient Details  Name: [name] MRN: [id number] Date of Birth: [birth year] Referring Provider: Dr Myrla Halsted  Encounter Date: 08/22/2017      PT End of Session - 08/22/17 1019    Visit Number 5   Number of Visits 12   Date for PT Re-Evaluation 08/28/17   PT Start Time 1019   PT Stop Time 1115   PT Time Calculation (min) 56 min      Past Medical History:  Diagnosis Date  . Allergy   . Anemia   . Anxiety    past hx   . Arthritis   . Asthma   . Atrial flutter ([place])    past history- not current  . CAD (coronary artery disease) CARDIOLOGIST--  DR Angelena Form   mild non-obstructive cad  . Cancer ([place])    right  . Cataract    bilaterally removed   . Chronic constipation   . Chronic kidney disease    interstitial cystitis  . COPD (chronic obstructive pulmonary disease) ([place])   . Depression    past hx   . Family history of malignant hyperthermia    father had this  . Fibromyalgia   . Frequency of urination   . GERD (gastroesophageal reflux disease)   . H/O hiatal hernia   . History of basal cell carcinoma excision    X2  . History of breast cancer ONCOLOGIST-- DR Jana Hakim---  NO RECURRANCE   DX 07/2012;  LOW GRADE DCIS  ER+PR+  ----  S/P RIGHT LUMPECTOMY WITH NEGATIVE MARGINS/   RADIATION ENDED 11/2012  . History of chronic bronchitis   . History of colonic polyps   . History of tachycardia    CONTROLLED  WITH ATENOLOL  . Hyperlipidemia   . Hypertension   . Neuromuscular disorder (HCC)    fibromyalgia  . Osteoporosis   . Pelvic pain   . S/P radiation therapy 11/12/12 - 12/05/12   right Breast  . Sepsis ([place]) 2014   from UTI   . Sinus headache   . Urgency of urination     Past Surgical History:  Procedure Laterality Date  . [place] STUDY N/A 04/03/2016   Procedure: [place]  STUDY;  Surgeon: Manus Gunning, MD;  Location: [facility];  Service: Gastroenterology;  Laterality: N/A;  . BREAST LUMPECTOMY Right 10-11-2012   W/ SLN BX  . CARDIAC CATHETERIZATION  09-13-2007  DR Lia Foyer   WELL-PRESERVED LVF/  DIFFUSE SCATTERED CORONARY CALCIFACATION AND ATHEROSCLEROSIS WITHOUT OBSTRUCTION  . CARDIAC CATHETERIZATION  08-04-2010  DR Angelena Form   NON-OBSTRUCTIVE CAD/  pLAD 40%/  oLAD 30%/  mLAD 30%/  pRCA 30%/  EF 60%  . CARDIOVASCULAR STRESS TEST  06-18-2012  DR McALHANY   LOW RISK NUCLEAR STUDY/  SMALL FIXED AREA OF MODERATELY DECREASED UPTAKE IN ANTEROSEPTAL WALL WHICH MAY BE ARTIFACTUAL/  NO ISCHEMIA/  EF 68%  . COLONOSCOPY  09-29-2010  . CYSTOSCOPY    . CYSTOSCOPY WITH HYDRODISTENSION AND BIOPSY N/A 03/06/2014   Procedure: CYSTOSCOPY/HYDRODISTENSION/ INSTILATION OF MARCAINE AND PYRIDIUM;  Surgeon: Ailene Rud, MD;  Location[facility];  Service: Urology;  Laterality: N/A;  . ESOPHAGEAL MANOMETRY N/A 04/03/2016   Procedure: ESOPHAGEAL MANOMETRY (EM);  Surgeon: Manus Gunning, MD;  [facility];  Service: Gastroenterology;  Laterality: N/A;  . NASAL SINUS SURGERY  1985  . ORIF RIGHT ANKLE  FX  2006  . POLYPECTOMY    . REMOVAL VOCAL CORD CYST  FEB 2014  . RIGHT BREAST BX  08-23-2012  . RIGHT HAND SURGERY  X3  LAST ONE 2009   INCLUDES  ORIF RIGHT 5TH FINGER AND REVISION TWICE  . TONSILLECTOMY AND ADENOIDECTOMY  AGE 70  . TOTAL ABDOMINAL HYSTERECTOMY W/ BILATERAL SALPINGOOPHORECTOMY  1982   W/  APPENDECTOMY  . TRANSTHORACIC ECHOCARDIOGRAM  06-24-2012   GRADE I DIASTOLIC DYSFUNCTION/  EF 55-60%/  MILD MR  . UPPER GASTROINTESTINAL ENDOSCOPY      There were no vitals filed for this visit.      Subjective Assessment - 08/22/17 1020    Subjective [name] reports she is still not feeling as well as she was before her scope,  She does think she has over done it a little.  The Rt side of shoulder is pulling more.  WAs bit in  the upper Rt thigh by a yellow jacket and has swelling present.    Patient Stated Goals reduce pain to base line, reduce the tightness she has been feeling to allow her to reduce her pain medication   Currently in Pain? Yes   Pain Score 5    Pain Location Neck   Pain Orientation Right;Left  Rt > than LT    Pain Descriptors / Indicators Aching;Dull   Pain Type Acute pain   Pain Onset More than a month ago   Pain Frequency Constant   Aggravating Factors  bending the neck forward, reading.    Pain Relieving Factors stretching and watching posture.             [facility] PT Assessment - 08/22/17 0001      AROM   Cervical - Right Rotation 68   Cervical - Left Rotation 62                     [facility] Adult PT Treatment/Exercise - 08/22/17 0001      Neck Exercises: Machines for Strengthening   UBE (Upper Arm Bike) L3 x 4' BWD only     Neck Exercises: Standing   Neck Retraction 10 reps  reviewed form , no sticking head forward      Neck Exercises: Supine   Neck Retraction 10 reps;10 secs  head press into ball off EOB   Other Supine Exercise 10 x 10sec thoracic lifts.      Modalities   Modalities Electrical Stimulation;Moist Heat     Moist Heat Therapy   Number Minutes Moist Heat 15 Minutes   Moist Heat Location Cervical     Electrical Stimulation   Electrical Stimulation Location bilat cervical and thoracic   Electrical Stimulation Action IFC    Electrical Stimulation Parameters to tolerance    Electrical Stimulation Goals Tone;Pain     Manual Therapy   Manual Therapy Soft tissue mobilization;Myofascial release;Manual Traction;Passive ROM   Soft tissue mobilization STM to bilat cervical paraspinals, peri-occiput and upper trap Rt  and pecs   Myofascial Release occipital base release   Passive ROM cerivcal in all direction   Manual Traction cervical                PT Education - 08/22/17 1035    Education provided Yes   Education Details HEP     Person(s) Educated Patient   Methods Explanation;Handout   Comprehension Returned demonstration;Verbalized understanding  PT Long Term Goals - 08/15/17 1021      PT LONG TERM GOAL #1   Title independent with HEP (08/28/17)   Status On-going     PT LONG TERM GOAL #2   Title improve Lt cervical rotation =/> 65 degrees (95/4/18)    Status Partially Met     PT LONG TERM GOAL #3   Title report 75% improvement in symtoms (08/28/17)   Status On-going     PT LONG TERM GOAL #4   Title improve FOTO =/< 40% limited, CJ level (9/4 /18)    Status On-going     PT LONG TERM GOAL #5   Title demo upper back strength =/> 4+/5    Status On-going               Plan - 08/22/17 1238    Clinical Impression Statement [name] has some improvement in cerivcal motion, she continues to have some stiffness and tightness peri-occiput and upper shoulders as well as pecs.  She recieved verbal cues for proper alignment while performing cervical retraction.    Rehab Potential Good   PT Frequency 2x / week   PT Duration 6 weeks   PT Treatment/Interventions Dry needling;Taping;Manual techniques;Therapeutic exercise;Moist Heat;Ultrasound;Cryotherapy;Electrical Stimulation;Iontophoresis 52m/ml Dexamethasone;Patient/family education;Passive range of motion   PT Next Visit Plan Cont with manual work and add in more postural re-ed work.       Patient will benefit from skilled therapeutic intervention in order to improve the following deficits and impairments:  Decreased range of motion, Increased muscle spasms, Pain, Postural dysfunction, Decreased strength  Visit Diagnosis: Cervicalgia  Muscle weakness (generalized)  Other muscle spasm     Problem List Patient Active Problem List   Diagnosis Date Noted  . Excessive postexertional fatigue 04/02/2017  . Pneumonia due to virus 04/02/2017  . Chronic interstitial cystitis 02/01/2017  . Acute pharyngitis 01/02/2017  . Dysphagia   .  Esophageal reflux   . Allergic rhinitis 01/25/2016  . Shortness of breath   . Left hip pain 04/30/2015  . Sciatica 04/30/2015  . Knee pain, right 04/30/2015  . Leukocytes in urine 04/30/2015  . Hyperlipidemia 03/01/2015  . Bilateral hand numbness 03/01/2015  . Pulmonary nodules 02/12/2015  . External hemorrhoids 02/05/2015  . Vaginitis and vulvovaginitis 02/05/2015  . Chronic night sweats 01/08/2015  . Atypical chest pain 01/01/2015  . Chest pain 01/01/2015  . Renal insufficiency 07/16/2014  . Sepsis secondary to [place]TI ([place] 02/05/2014  . Grief reaction 12/29/2013  . Urinary hesitancy 12/04/2013  . Abdominal pain, other specified site 10/26/2013  . Headache(784.0) 10/13/2013  . Pain, joint, multiple sites 08/18/2013  . Vasculitis ([place] 07/29/2013  . ANA positive 07/29/2013  . Other and unspecified hyperlipidemia 07/12/2013  . Contusion, chest wall 04/30/2013  . Malignant neoplasm of lower-outer quadrant of right breast of female, estrogen receptor positive ([place] 01/03/2013  . Splenic lesion 09/02/2012  . Asthma, allergic 08/05/2012  . GERD (gastroesophageal reflux disease) 06/03/2012  . CAD (coronary artery disease) 06/03/2012  . Former heavy tobacco smoker 06/03/2012  . Edema 04/08/2012  . Stricture and stenosis of esophagus 10/19/2011  . Constipation - functional 07/21/2011  . Nonspecific (abnormal) findings on radiological and other examination of biliary tract 07/21/2011  . Osteoporosis 04/17/2011  . POSTMENOPAUSAL STATUS 01/27/2011  . CONSTIPATION 11/02/2010  . FIBROCYSTIC BREAST DISEASE, HX OF 10/18/2010  . CAD, NATIVE VESSEL 08/31/2010  . Essential hypertension 08/25/2010  . CAROTID ARTERY DISEASE 08/25/2010  . TOBACCO ABUSE, HX OF 08/25/2010  .  GENERALIZED ANXIETY DISORDER 08/03/2010  . FIBROMYALGIA 01/11/2010  . SKIN CANCER, HX OF 01/11/2010    Jeral Pinch PT 08/22/2017, 12:40 PM  [facility] [place]  [place], [place], [address] Phone: [phone]   Fax:  [phone]  Name: [name] MRN: [id number] Date of Birth: 03-May-1946

## 2017-08-30 ENCOUNTER — Ambulatory Visit (INDEPENDENT_AMBULATORY_CARE_PROVIDER_SITE_OTHER): Payer: Medicare Other | Admitting: Physical Therapy

## 2017-08-30 ENCOUNTER — Encounter: Payer: Self-pay | Admitting: Physical Therapy

## 2017-08-30 DIAGNOSIS — M542 Cervicalgia: Secondary | ICD-10-CM

## 2017-08-30 DIAGNOSIS — M62838 Other muscle spasm: Secondary | ICD-10-CM

## 2017-08-30 DIAGNOSIS — M6281 Muscle weakness (generalized): Secondary | ICD-10-CM | POA: Diagnosis not present

## 2017-08-30 NOTE — Patient Instructions (Signed)
Over Head Pull: Narrow Grip     [place] [phone] DO ALL THE BELOW ON A POOL NOODLE LINED on your back   On back, knees bent, feet flat, band across thighs, elbows straight but relaxed. Pull hands apart (start). Keeping elbows straight, bring arms up and over head, hands toward floor. Keep pull steady on band. Hold momentarily. Return slowly, keeping pull steady, back to start. Repeat _20__ times. Band color __green____   Side Pull: Double Arm   On back, knees bent, feet flat. Arms perpendicular to body, shoulder level, elbows straight but relaxed. Pull arms out to sides, elbows straight. Resistance band comes across collarbones, hands toward floor. Hold momentarily, squeezing shoulder blades around noodle. Slowly return to starting position. Repeat _20__ times. Band color __green___   Elmer Picker - try to stretch hand overhead to touch the floor.    On back, knees bent, feet flat, left hand on left hip, right hand above left. Pull right arm DIAGONALLY (hip to shoulder) across chest. Bring right arm along head toward floor. Hold momentarily. Slowly return to starting position. Repeat _20__ times. Do with left arm. Band color __green____   Shoulder Rotation: Double Arm     On back, knees bent, feet flat, elbows tucked at sides, bent 90, hands palms up. Pull hands apart and down toward floor, keeping elbows near sides, squeezing shoulder blades around the noodle. Hold momentarily. Slowly return to starting position. Repeat __20_ times. Band color _green_____

## 2017-08-30 NOTE — Therapy (Signed)
[facility], [place], [address] Phone: [phone]   Fax:  [phone]  Physical Therapy Treatment  Patient Details  Name: [name] MRN: [id number] Date of Birth: [birth year] Referring Provider: Dr Myrla Halsted  Encounter Date: 08/30/2017      PT End of Session - 08/30/17 1406    Visit Number 6   Number of Visits 14   Date for PT Re-Evaluation 09/27/17   PT Start Time 3568   PT Stop Time 1505   PT Time Calculation (min) 57 min   Activity Tolerance Patient tolerated treatment well      Past Medical History:  Diagnosis Date  . Allergy   . Anemia   . Anxiety    past hx   . Arthritis   . Asthma   . Atrial flutter ([place])    past history- not current  . CAD (coronary artery disease) CARDIOLOGIST--  DR Angelena Form   mild non-obstructive cad  . Cancer ([place])    right  . Cataract    bilaterally removed   . Chronic constipation   . Chronic kidney disease    interstitial cystitis  . COPD (chronic obstructive pulmonary disease) ([place])   . Depression    past hx   . Family history of malignant hyperthermia    father had this  . Fibromyalgia   . Frequency of urination   . GERD (gastroesophageal reflux disease)   . H/O hiatal hernia   . History of basal cell carcinoma excision    X2  . History of breast cancer ONCOLOGIST-- DR Jana Hakim---  NO RECURRANCE   DX 07/2012;  LOW GRADE DCIS  ER+PR+  ----  S/P RIGHT LUMPECTOMY WITH NEGATIVE MARGINS/   RADIATION ENDED 11/2012  . History of chronic bronchitis   . History of colonic polyps   . History of tachycardia    CONTROLLED  WITH ATENOLOL  . Hyperlipidemia   . Hypertension   . Neuromuscular disorder (HCC)    fibromyalgia  . Osteoporosis   . Pelvic pain   . S/P radiation therapy 11/12/12 - 12/05/12   right Breast  . Sepsis ([place]) 2014   from UTI   . Sinus headache   . Urgency of urination     Past Surgical History:  Procedure Laterality Date  . [place] STUDY N/A 04/03/2016   Procedure: [place] STUDY;  Surgeon: Manus Gunning, MD;  [facility];  Service: Gastroenterology;  Laterality: N/A;  . BREAST LUMPECTOMY Right 10-11-2012   W/ SLN BX  . CARDIAC CATHETERIZATION  09-13-2007  DR Lia Foyer   WELL-PRESERVED LVF/  DIFFUSE SCATTERED CORONARY CALCIFACATION AND ATHEROSCLEROSIS WITHOUT OBSTRUCTION  . CARDIAC CATHETERIZATION  08-04-2010  DR Angelena Form   NON-OBSTRUCTIVE CAD/  pLAD 40%/  oLAD 30%/  mLAD 30%/  pRCA 30%/  EF 60%  . CARDIOVASCULAR STRESS TEST  06-18-2012  DR McALHANY   LOW RISK NUCLEAR STUDY/  SMALL FIXED AREA OF MODERATELY DECREASED UPTAKE IN ANTEROSEPTAL WALL WHICH MAY BE ARTIFACTUAL/  NO ISCHEMIA/  EF 68%  . COLONOSCOPY  09-29-2010  . CYSTOSCOPY    . CYSTOSCOPY WITH HYDRODISTENSION AND BIOPSY N/A 03/06/2014   Procedure: CYSTOSCOPY/HYDRODISTENSION/ INSTILATION OF MARCAINE AND PYRIDIUM;  Surgeon: Ailene Rud, MD;  Location[facility];  Service: Urology;  Laterality: N/A;  . ESOPHAGEAL MANOMETRY N/A 04/03/2016   Procedure: ESOPHAGEAL MANOMETRY (EM);  Surgeon: Manus Gunning, MD;  [facility];  Service: Gastroenterology;  Laterality: N/A;  . NASAL SINUS SURGERY  1985  . ORIF RIGHT ANKLE  FX  2006  . POLYPECTOMY    . REMOVAL VOCAL CORD CYST  FEB 2014  . RIGHT BREAST BX  08-23-2012  . RIGHT HAND SURGERY  X3  LAST ONE 2009   INCLUDES  ORIF RIGHT 5TH FINGER AND REVISION TWICE  . TONSILLECTOMY AND ADENOIDECTOMY  AGE 38  . TOTAL ABDOMINAL HYSTERECTOMY W/ BILATERAL SALPINGOOPHORECTOMY  1982   W/  APPENDECTOMY  . TRANSTHORACIC ECHOCARDIOGRAM  06-24-2012   GRADE I DIASTOLIC DYSFUNCTION/  EF 55-60%/  MILD MR  . UPPER GASTROINTESTINAL ENDOSCOPY      There were no vitals filed for this visit.      Subjective Assessment - 08/30/17 1411    Subjective [name] reports she has been feeling horrible, not moving as much due to her trips and travels. She is tired and has had  things going on every night. [name] is not sure if her feelings are from fibromyalgia or something else.  She has been reducing her meds and wonders if it is to much.   Pertinent History osteoporosis, having some other health issues with her bowels/vagina getting more tests this week.    Patient Stated Goals reduce pain to base line, reduce the tightness she has been feeling to allow her to reduce her pain medication   Currently in Pain? Yes   Pain Score 5    Pain Location Neck   Pain Orientation Right;Left   Pain Descriptors / Indicators Aching   Pain Type Chronic pain   Pain Onset More than a month ago   Pain Frequency Intermittent   Aggravating Factors  moving her neck, bending forward/looking down.    Pain Relieving Factors stretches - doorway, heat            [facility] PT Assessment - 08/30/17 0001      Assessment   Medical Diagnosis Musculoskeletal neck pain   Referring Provider Dr Myrla Halsted   Onset Date/Surgical Date 06/17/17   Hand Dominance Right     Observation/Other Assessments   Focus on Therapeutic Outcomes (FOTO)  51% limited     ROM / Strength   AROM / PROM / Strength AROM;Strength     AROM   AROM Assessment Site Cervical   Cervical Flexion 44  pulling along either side of neck   Cervical Extension 30   Cervical - Right Side Bend 32   Cervical - Left Side Bend 28   Cervical - Right Rotation 78   Cervical - Left Rotation 65     Strength   Overall Strength Comments upper back strength mid traps Rt 4+/5, Lt 4/5   Strength Assessment Site Shoulder   Right/Left Shoulder --  bilat 5/5                     [facility] Adult PT Treatment/Exercise - 08/30/17 0001      Neck Exercises: Machines for Strengthening   UBE (Upper Arm Bike) L4x1', then L2 x 3' alt FWD/BWD     Neck Exercises: Supine   Other Supine Exercise 10-20 reps, green band, lying on pool noodle, overhead pull, horizontal abduction, ER and SASH     Manual Therapy   Manual Therapy Passive  ROM                PT Education - 08/30/17 1431    Education provided Yes   Education Details HEP with green band lying on  noodle   Person(s) Educated Patient   Methods Explanation;Demonstration;Handout   Comprehension Returned demonstration;Verbalized understanding             PT Long Term Goals - 08/30/17 1409      PT LONG TERM GOAL #1   Title independent with HEP (09/27/17)   Time 4   Period Weeks   Status On-going     PT LONG TERM GOAL #2   Title improve Lt cervical rotation =/> 65 degrees (08/28/17)    Period Weeks   Status Achieved  Rt 78, lt 65     PT LONG TERM GOAL #3   Title report 75% improvement in symtoms (09/27/17)   Time 4   Period Weeks   Status On-going  pt reports 50% improvement     PT LONG TERM GOAL #4   Title improve FOTO =/< 40% limited, CJ level (10/4 /18)    Time 4   Period Weeks   Status On-going  51% limited     PT LONG TERM GOAL #5   Title demo upper back strength =/> 4+/5 (09/27/17)    Time 4   Period Weeks   Status Partially Met               Plan - 08/30/17 1557    Clinical Impression Statement [name] did respond better to therapy when she was coming twice a week and didn't have a lot of travel and stress. Her travel is about done so she has more time to take care of herself.  She also decreased her medication a little more than the MD had recommended and wonders if this is another reason for her not feeling so well. Over all she does have improvements in upper body strength and some improvement in cervical ROM.  Her progress is inconsistent during her times of travel this past month.  She would benefit from continued treatment with a focus on manual therapy to improve cervical motion and strengthening for postural and cervical stability.    Rehab Potential Good   PT Frequency 2x / week   PT Duration 4 weeks   PT Treatment/Interventions Dry needling;Taping;Manual techniques;Therapeutic exercise;Moist  Heat;Ultrasound;Cryotherapy;Electrical Stimulation;Iontophoresis 30m/ml Dexamethasone;Patient/family education;Passive range of motion   PT Next Visit Plan Cont with manual work and add in more postural re-ed work.    Consulted and Agree with Plan of Care Patient      Patient will benefit from skilled therapeutic intervention in order to improve the following deficits and impairments:  Decreased range of motion, Increased muscle spasms, Pain, Postural dysfunction, Decreased strength  Visit Diagnosis: Cervicalgia - Plan: PT plan of care cert/re-cert  Muscle weakness (generalized) - Plan: PT plan of care cert/re-cert  Other muscle spasm - Plan: PT plan of care cert/re-cert     Problem List Patient Active Problem List   Diagnosis Date Noted  . Excessive postexertional fatigue 04/02/2017  . Pneumonia due to virus 04/02/2017  . Chronic interstitial cystitis 02/01/2017  . Acute pharyngitis 01/02/2017  . Dysphagia   . Esophageal reflux   . Allergic rhinitis 01/25/2016  . Shortness of breath   . Left hip pain 04/30/2015  . Sciatica 04/30/2015  . Knee pain, right 04/30/2015  . Leukocytes in urine 04/30/2015  . Hyperlipidemia 03/01/2015  . Bilateral hand numbness 03/01/2015  . Pulmonary nodules 02/12/2015  . External hemorrhoids 02/05/2015  . Vaginitis and vulvovaginitis 02/05/2015  . Chronic night sweats 01/08/2015  . Atypical chest pain 01/01/2015  . Chest pain 01/01/2015  .  Renal insufficiency 07/16/2014  . Sepsis secondary to UTI ([place]) 02/05/2014  . Grief reaction 12/29/2013  . Urinary hesitancy 12/04/2013  . Abdominal pain, other specified site 10/26/2013  . Headache(784.0) 10/13/2013  . Pain, joint, multiple sites 08/18/2013  . Vasculitis ([place]) 07/29/2013  . ANA positive 07/29/2013  . Other and unspecified hyperlipidemia 07/12/2013  . Contusion, chest wall 04/30/2013  . Malignant neoplasm of lower-outer quadrant of right breast of female, estrogen receptor positive  ([place]) 01/03/2013  . Splenic lesion 09/02/2012  . Asthma, allergic 08/05/2012  . GERD (gastroesophageal reflux disease) 06/03/2012  . CAD (coronary artery disease) 06/03/2012  . Former heavy tobacco smoker 06/03/2012  . Edema 04/08/2012  . Stricture and stenosis of esophagus 10/19/2011  . Constipation - functional 07/21/2011  . Nonspecific (abnormal) findings on radiological and other examination of biliary tract 07/21/2011  . Osteoporosis 04/17/2011  . POSTMENOPAUSAL STATUS 01/27/2011  . CONSTIPATION 11/02/2010  . FIBROCYSTIC BREAST DISEASE, HX OF 10/18/2010  . CAD, NATIVE VESSEL 08/31/2010  . Essential hypertension 08/25/2010  . CAROTID ARTERY DISEASE 08/25/2010  . TOBACCO ABUSE, HX OF 08/25/2010  . GENERALIZED ANXIETY DISORDER 08/03/2010  . FIBROMYALGIA 01/11/2010  . SKIN CANCER, HX OF 01/11/2010    Jeral Pinch PT  08/30/2017, 4:04 PM  [facility] [place], [place], [address] Phone: [phone]   Fax:  [phone]  Name: [name] MRN: [id number] Date of Birth: 06/07/1946

## 2017-09-04 ENCOUNTER — Encounter: Payer: Medicare Other | Admitting: Physical Therapy

## 2017-09-07 ENCOUNTER — Encounter: Payer: Medicare Other | Admitting: Physical Therapy

## 2017-09-10 ENCOUNTER — Ambulatory Visit (INDEPENDENT_AMBULATORY_CARE_PROVIDER_SITE_OTHER): Payer: Medicare Other | Admitting: Neurology

## 2017-09-10 ENCOUNTER — Encounter: Payer: Self-pay | Admitting: Neurology

## 2017-09-10 VITALS — BP 126/79 | HR 75 | Resp 16 | Ht 64.0 in | Wt 123.4 lb

## 2017-09-10 DIAGNOSIS — R51 Headache: Secondary | ICD-10-CM

## 2017-09-10 DIAGNOSIS — I671 Cerebral aneurysm, nonruptured: Secondary | ICD-10-CM | POA: Diagnosis not present

## 2017-09-10 DIAGNOSIS — H4901 Third [oculomotor] nerve palsy, right eye: Secondary | ICD-10-CM

## 2017-09-10 DIAGNOSIS — H02401 Unspecified ptosis of right eyelid: Secondary | ICD-10-CM

## 2017-09-10 DIAGNOSIS — R5382 Chronic fatigue, unspecified: Secondary | ICD-10-CM | POA: Diagnosis not present

## 2017-09-10 DIAGNOSIS — R531 Weakness: Secondary | ICD-10-CM | POA: Diagnosis not present

## 2017-09-10 DIAGNOSIS — R519 Headache, unspecified: Secondary | ICD-10-CM

## 2017-09-10 MED ORDER — ERGOCALCIFEROL 1.25 MG (50000 UT) PO CAPS: 50000.0000 [IU] | ORAL_CAPSULE | ORAL | 0 refills | Status: DC

## 2017-09-10 NOTE — Addendum Note (Signed)
Addended by: Sarina Ill B on: 09/10/2017 10:43 AM   Modules accepted: Orders

## 2017-09-10 NOTE — Patient Instructions (Addendum)
Remember to drink plenty of fluid, eat healthy meals and do not skip any meals. Try to eat protein with a every meal and eat a healthy snack such as fruit or nuts in between meals. Try to keep a regular sleep-wake schedule and try to exercise daily, particularly in the form of walking, 20-30 minutes a day, if you can.   As far as diagnostic testing: Labs, MRI brain and MRA head, emg/ncs  I would like to see you back  For emg/ncs months, sooner if we need to. Please call us with any interim questions, concerns, problems, updates or refill requests.   Our phone number is [phone]. We also have an after hours call service for urgent matters and there is a physician on-call for urgent questions. For any emergencies you know to call 911 or go to the nearest emergency room

## 2017-09-10 NOTE — Progress Notes (Signed)
[facility]    Provider:  Dr Jaynee Eagles Primary Care Physician:  Wayland Salinas, MD  CC: Memory problems, balance issues, vertigo, falls, tremor, fibromyalgia, bilateral CTS, snoring and morning headaches  Interval history: She is having difficulty sleeping. Her legs ache. May be her Fibromyalgia. She is very fatigued. She has constipation. Sheis really fatigued. She can;t walk up stairs. She is in PT. She is having SOB. She has a lot of weakness, chronic pain. Right eye is drooping more. Extreme fatigue especially at the end of the day. She has headaches every day, she has a general headache, the left side of her skull hurts more. She has new ptosis and fatigue and new onset headache.    Interval history 03/07/2017: She has severe pain. Since her 71s she has had pain she hurts all over even to touch the skin. It hurts to touch her skin. It is hurting if she touches any part of her body with the other part. Dr. Maudie Mercury is working on that. Headaches come from the neck. She has "bulging disks". She hurts badly in the neck. She has daily headaches. She has neck pain. She has chronic pain all over. She is frustrated.   Intreval history: Snoring wakes her up, she snore heavilty, she has morning headache, dry mouth. Daytime fatigue. She did vestibular therapy for her chronic dizziness. Discussed her normal neurocognitive testing which did not show neurodegenerative disease.   REFERRING DIAGNOSIS:  Memory loss  FINAL DIAGNOSES:  Normal Cognitive Functioning Fibromyalgia Unspecified Depressive Disorder (well-controlled) Unspecified Anxiety Disorder (well-controlled)  RECOMMENDATIONS: ? Follow-up with Dr. Jaynee Eagles is recommended as needed.  ? No treatment with nootropic medication warranted.  ? Continued follow-up regarding her psychopharmacological medications is warranted.  ? Patient may wish to consider a medication review with one of her care providers to help determine if  any changes would be appropriate (and not medically contraindicated) that could help optimize cognition.  ? Follow-up, as needed, with the [facility]) is recommended for routine appointments, educational purposes, psychosocial concerns, and brief neurocognitive testing as necessary. ? Neuropsychological re-evaluation is recommended as needed to monitor treatment efficacy, to assist with the management of the patient (start or continue rehab or pharmacological therapy), to determine any clinical and functional significance of brain abnormality over time, as well as to document any potential improvement or decline in cognitive functioning. Lastly, any follow-up testing will help delineate the specific cognitive basis of any new functional complaints.   The following are several strategies that may help compensate for any real or perceived cognitive deficits. These are to be used as applicable. I will also provide additional information with strategies for memory loss and attention issues. ? Performance will generally be best in a structured, routine, and familiar environment, as opposed to situations involving complex problems.  ? To the extent possible, multitasking should be avoided. And if there are difficulties in organization and planning, maintaining a daily organizer to help keep track of important appointments and information may be beneficial.  ? Memory problems may at least be minimally addressed using compensatory strategies such as the use of a daily schedule to follow, memos, portable recorder, a centrally located bulletin board, or memory notebook.  ? To aid in managing perceived problems with attention, she may consider using some of the following strategies: o The patient should simplify tasks. There may be a need to break overly complex activities into simple step-by-step tasks, keep these steps written down  in a note book and then check them off as they  are completed which will help to stay on task and make sure the whole task is finished.  o The patient should set deadlines for everything, even for seemingly small tasks, prioritize time-sensitive tasks and write down every assignment, message, or important thought. o The patient is encouraged to use timers and alarms to stay on track and take breaks at regular intervals. Avoid piles of paperwork or procrastination by dealing with each item as it comes in.  Mrs. Vidana is encouraged to attend to lifestyle factors for brain health (e.g., regular physical exercise, good nutrition habits, regular participation in cognitively-stimulating activities, and general stress management techniques), which are likely to have benefits for both emotional adjustment and cognition. In fact, in addition to promoting general good health, regular exercise incorporating aerobic activities (e.g., brisk walking, jogging, bicycling, etc.) has been demonstrated to be a very effective treatment for depression and stress, with similar efficacy rates to both antidepressant medication and psychotherapy. And for those with orthopedic issues, water aerobics may be particularly beneficial.  IEK:TDCZF Banskyis a 71 y.o.femalehere as a referral from Dr. Saunders Glance memory loss. PMHx right breast cancer s/p radiation therapy 2013, chronic pain and Fibromyalgia, HTN, COPD, arthritis, fatigue, hyperlipidemia, insomnia, leg and back pain and lumbar radiculopathy, anxiety, vertigo, nutcracker esophagus, coronary artery disease. She has had memory loss a year ago, she cant pull out words, words just don;t come, difficulty spelling, she has started forgetting how to get places and gone up the wrong ramp, getting lost. Slowly progressive. She fell and hit her head 31/2 weeks ago and hit it really hard. The first time she fell she must missed the seat. The second time she just fell, doesn't know if the hip went out, she was not clear headed. She  discusses balance problems, not doing yoga, she has hip and back problems.   Memory: Memory is slowly progressive. She is getting very anxious about it. Difficulty carrying on a conversation. There is also some confusion. Aunts with dementia. Difficulty spelling. She may have had neurocognitive testing in the past as she remembers spo maybe she has had complaints for longer. She forgets things, can;t get words out. No Fhx memory loss.   Vertigo: the morning before she got up she felt dizzy. Balance problems, she has had difficulty with vision since cataract surgery. The morning before she fell. She got up and opened her eyes and had some room spinning, if she moved the room was spinning. Laying in bed the light was spinning. She has had vertigo before, she was seen by therapy in the past. She was diagnosed with BPPV and after they did exercise. Moving eyes to right bothers her.   Reviewed notes, labs and imaging from outside physicians, which showed:  B12 and TSH normal 02/2015. Hgba1c 5.8 02/2015.   MRI of the brain 09/2015: personally reviewed images and agree with the following. Would also add some generalized mild atrophy more pronounced in the mesial temporal areas.   FINDINGS: There is no evidence of acute infarct, intracranial hemorrhage, mass, midline shift, or extra-axial fluid collection. Ventricles and sulci are within normal limits for age. Punctate foci of T2 hyperintensity are again seen scattered throughout the subcortical and deep cerebral white matter bilaterally, not significantly changed and nonspecific but compatible with mild chronic small vessel ischemic disease.  Prior bilateral cataract extraction is noted. Paranasal sinuses and mastoid air cells are clear. Major intracranial vascular flow voids are  preserved.  IMPRESSION: 1. No acute intracranial abnormality or mass. 2. Mild chronic small vessel ischemic disease.  Reviewed notes from primary care. 4 months  ago developed numbness of hands. Fingers of both hands are equally affected. Also has a toes. Says they're always cold. She still has strength in the hands. Has a history of bulging disc. She has seen a neurologist in the past Dr. Sabra Heck and was diagnosed with CTS. She says she clenches her hands. Says her memory is terrible. Also reports some memory loss, there was a full where she hurt her shoulder, had vertigo hit her head, noticed some memory loss even before this fall. She tried bilateral wrist immobilizers at nighttime. Cold misdiagnosed for Raynaud's. Asked for evaluation of bilateral hand numbness and memory loss. Scheduled for vestibular therapy for vertigo. Status post fall asked to start using a cane to ambulate.    Review of Systems: Patient complains of symptoms per HPI as well as the following symptoms: Chills, fatigue, blurred vision, loss of vision, snoring, feeling cold, flushing, ringing in ears, spinning sensation, trouble swallowing, constipation, joint pain, joint swelling, cramps, aching muscles, memory loss, headache, numbness, weakness, slurred speech, difficulty swallowing, dizziness, tremor, insomnia, snoring, restless legs, depression, anxiety, racing thoughts. Pertinent negatives per HPI. All others negative.  Social History   Social History  . Marital status: Married    Spouse name: Jori Moll   . Number of children: 3  . Years of education: BA   Occupational History  . Retired Therapist, sports Retired   Social History Main Topics  . Smoking status: Former Smoker    Packs/day: 1.00    Years: 15.00    Types: Cigarettes    Quit date: 05/27/2005  . Smokeless tobacco: Never Used  . Alcohol use No  . Drug use: No  . Sexual activity: Yes    Partners: Male   Other Topics Concern  . Not on file   Social History Narrative   Lives with husband.   Caffeine use: 1/2 cup per day   Exercise-- 2days a week [employer],  Water aerobics, walking     Family History  Problem Relation Age of  Onset  . Rectal cancer Mother   . Colon cancer Mother   . Breast cancer Mother   . Pancreatic cancer Mother   . Breast cancer Maternal Aunt        breast  . Irritable bowel syndrome Son   . Irritable bowel syndrome Daughter   . Heart disease Other   . Arthritis Other   . Diabetes Other        1st degree relative   . Hyperlipidemia Other   . Hypertension Other   . Osteoporosis Other   . Coronary artery disease Other 64  . Breast cancer Other        1st degree relative <50, lung  . Colon cancer Father   . Colon polyps Father   . Esophageal cancer Neg Hx   . Stomach cancer Neg Hx     Past Medical History:  Diagnosis Date  . Allergy   . Anemia   . Anxiety    past hx   . Arthritis   . Asthma   . Atrial flutter ([place])    past history- not current  . CAD (coronary artery disease) CARDIOLOGIST--  DR Angelena Form   mild non-obstructive cad  . Cancer ([place])    right  . Cataract    bilaterally removed   . Chronic constipation   . Chronic  kidney disease    interstitial cystitis  . COPD (chronic obstructive pulmonary disease) ([place])   . Depression    past hx   . Family history of malignant hyperthermia    father had this  . Fibromyalgia   . Frequency of urination   . GERD (gastroesophageal reflux disease)   . H/O hiatal hernia   . History of basal cell carcinoma excision    X2  . History of breast cancer ONCOLOGIST-- DR Jana Hakim---  NO RECURRANCE   DX 07/2012;  LOW GRADE DCIS  ER+PR+  ----  S/P RIGHT LUMPECTOMY WITH NEGATIVE MARGINS/   RADIATION ENDED 11/2012  . History of chronic bronchitis   . History of colonic polyps   . History of tachycardia    CONTROLLED  WITH ATENOLOL  . Hyperlipidemia   . Hypertension   . Neuromuscular disorder ([facility])    fibromyalgia  . Osteoporosis   . Pelvic pain   . S/P radiation therapy 11/12/12 - 12/05/12   right Breast  . Sepsis ([place]) 2014   from UTI   . Sinus headache   . Urgency of urination     Past Surgical History:  Procedure  Laterality Date  . [place] STUDY N/A 04/03/2016   Procedure: [place] STUDY;  Surgeon: Manus Gunning, MD;  Location: [facility];  Service: Gastroenterology;  Laterality: N/A;  . BREAST LUMPECTOMY Right 10-11-2012   W/ SLN BX  . CARDIAC CATHETERIZATION  09-13-2007  DR Lia Foyer   WELL-PRESERVED LVF/  DIFFUSE SCATTERED CORONARY CALCIFACATION AND ATHEROSCLEROSIS WITHOUT OBSTRUCTION  . CARDIAC CATHETERIZATION  08-04-2010  DR Angelena Form   NON-OBSTRUCTIVE CAD/  pLAD 40%/  oLAD 30%/  mLAD 30%/  pRCA 30%/  EF 60%  . CARDIOVASCULAR STRESS TEST  06-18-2012  DR McALHANY   LOW RISK NUCLEAR STUDY/  SMALL FIXED AREA OF MODERATELY DECREASED UPTAKE IN ANTEROSEPTAL WALL WHICH MAY BE ARTIFACTUAL/  NO ISCHEMIA/  EF 68%  . COLONOSCOPY  09-29-2010  . CYSTOSCOPY    . CYSTOSCOPY WITH HYDRODISTENSION AND BIOPSY N/A 03/06/2014   Procedure: CYSTOSCOPY/HYDRODISTENSION/ INSTILATION OF MARCAINE AND PYRIDIUM;  Surgeon: Ailene Rud, MD;  Location: [facility];  Service: Urology;  Laterality: N/A;  . ESOPHAGEAL MANOMETRY N/A 04/03/2016   Procedure: ESOPHAGEAL MANOMETRY (EM);  Surgeon: Manus Gunning, MD;  Location: [facility];  Service: Gastroenterology;  Laterality: N/A;  . NASAL SINUS SURGERY  1985  . ORIF RIGHT ANKLE  FX  2006  . POLYPECTOMY    . REMOVAL VOCAL CORD CYST  FEB 2014  . RIGHT BREAST BX  08-23-2012  . RIGHT HAND SURGERY  X3  LAST ONE 2009   INCLUDES  ORIF RIGHT 5TH FINGER AND REVISION TWICE  . TONSILLECTOMY AND ADENOIDECTOMY  AGE 49  . TOTAL ABDOMINAL HYSTERECTOMY W/ BILATERAL SALPINGOOPHORECTOMY  1982   W/  APPENDECTOMY  . TRANSTHORACIC ECHOCARDIOGRAM  06-24-2012   GRADE I DIASTOLIC DYSFUNCTION/  EF 55-60%/  MILD MR  . UPPER GASTROINTESTINAL ENDOSCOPY      Current Outpatient Prescriptions  Medication Sig Dispense Refill  . acetaminophen (TYLENOL) 500 MG tablet Take 1,000 mg by mouth every 6 (six) hours as needed for mild pain or headache.     . albuterol  (PROVENTIL HFA;VENTOLIN HFA) 108 (90 BASE) MCG/ACT inhaler Inhale 2 puffs into the lungs every 4 (four) hours as needed. 1 Inhaler 5  . aspirin EC 81 MG tablet Take 1 tablet (81 mg total) by mouth daily. 90 tablet 3  .  atenolol (TENORMIN) 25 MG tablet Take 25 mg by mouth daily.    . DULoxetine (CYMBALTA) 60 MG capsule TAKE 1 BY MOUTH TWICE DAILY 180 capsule 1  . Estradiol 10 MCG TABS vaginal tablet Place 1 tablet (10 mcg total) vaginally 2 (two) times a week. 8 tablet 11  . fluticasone (FLONASE) 50 MCG/ACT nasal spray Place 1 spray into the nose as needed for allergies.     . furosemide (LASIX) 20 MG tablet Take 1 tablet (20 mg total) by mouth every morning. 180 tablet 1  . gabapentin (NEURONTIN) 300 MG capsule Take 1 capsule (300 mg total) by mouth 2 (two) times daily. 60 capsule 6  . loratadine (CLARITIN) 10 MG tablet Take 1 tablet (10 mg total) by mouth daily. 30 tablet 11  . losartan (COZAAR) 25 MG tablet Take 25 mg by mouth daily.     Marland Kitchen omeprazole (PRILOSEC) 40 MG capsule Take 40 mg by mouth 2 (two) times daily.    . pentosan polysulfate (ELMIRON) 100 MG capsule Take 1 capsule (100 mg total) by mouth 3 (three) times daily. Reported on 01/05/2016 (Patient taking differently: Take 100 mg by mouth 3 (three) times daily with meals as needed. Reported on 01/05/2016) 90 capsule 11  . traMADol (ULTRAM) 50 MG tablet Take 1-2 tablets (50-100 mg total) by mouth 3 (three) times daily as needed for moderate pain. (Patient taking differently: Take 50-100 mg by mouth 3 (three) times daily as needed for moderate pain (pt uses TID). ) 180 tablet 0  . traZODone (DESYREL) 100 MG tablet Take 1 tablet (100 mg total) by mouth at bedtime. 180 tablet 1   Current Facility-Administered Medications  Medication Dose Route Frequency Provider Last Rate Last Dose  . 0.9 %  sodium chloride infusion  500 mL Intravenous Continuous Armbruster, Carlota Raspberry, MD      . 0.9 %  sodium chloride infusion  500 mL Intravenous Continuous  Armbruster, Carlota Raspberry, MD       Facility-Administered Medications Ordered in Other Visits  Medication Dose Route Frequency Provider Last Rate Last Dose  . bupivacaine (MARCAINE) 0.5 % 10 mL, triamcinolone acetonide (KENALOG-40) 40 mg injection   Subcutaneous Once Carolan Clines, MD      . bupivacaine (MARCAINE) 0.5 % 15 mL, phenazopyridine (PYRIDIUM) 400 mg bladder mixture   Bladder Instillation Once Carolan Clines, MD        Allergies as of 09/10/2017 - Review Complete 09/10/2017  Allergen Reaction Noted  . Clindamycin hcl Shortness Of Breath and Rash 04/03/2011  . Penicillins Anaphylaxis 01/11/2010  . Prednisone Shortness Of Breath and Rash 06/16/2011  . Codeine Hives and Other (See Comments) 01/11/2010  . Fluzone [flu virus vaccine] Other (See Comments) 09/29/2014  . Latex Hives 08/29/2012  . Pentazocine lactate Other (See Comments) 01/11/2010  . Pneumococcal vaccine polyvalent Hives, Swelling, and Other (See Comments) 01/31/2011  . Tamoxifen Nausea And Vomiting and Other (See Comments) 03/03/2014    Vitals: BP 126/79   Pulse 75   Resp 16   Ht '5\' 4"'$  (1.626 m)   Wt 123 lb 6.4 oz (56 kg)   BMI 21.18 kg/m  Last Weight:  Wt Readings from Last 1 Encounters:  09/10/17 123 lb 6.4 oz (56 kg)   Last Height:   Ht Readings from Last 1 Encounters:  09/10/17 '5\' 4"'$  (1.626 m)   Physical exam: Exam: Gen: NAD, conversant, well nourised, obese, well groomed  Eyes: Conjunctivae clear without exudates or hemorrhage  Neuro: Detailed Neurologic Exam  Speech:    Speech is normal; fluent and spontaneous with normal comprehension.  Cognition:    The patient is oriented to person, place, and time;    Cranial Nerves:    The pupils are equal, round, and reactive to light. Visual fields are full to finger confrontation. Extraocular movements are intact. Trigeminal sensation is intact and the muscles of mastication are normal. Right ptois. The palate elevates in the  midline. Hearing intact. Voice is normal. Shoulder shrug is normal. The tongue has normal motion without fasciculations.   Coordination:    Normal finger to nose and heel to shin. Normal rapid alternating movements.   Motor Observation:    No asymmetry, no atrophy, and no involuntary movements noted. Tone:    Normal muscle tone.    Posture:    Posture is normal. normal erect    Strength:    Mild proximal weakness.     Sensation: intact to LT     Reflex Exam:  DTR's:    Deep tendon reflexes in the upper and lower extremities are normal bilaterally.   Toes:    The toes are downgoing bilaterally.   Clonus:    Clonus is absent.     Assessment/Plan:71 year old with multiple ocmplaints she wants to address today including Memory problems, balance issues, vertigo, falls, tremor, fibromyalgia, bilateral CTS. Chronic fatigue, new ptosis.   New Weakness: Labs and emg/ncs including CK and myasthenia gravis workup due to weakness, fatigue, new onset right ptosis New Ptosis: MRI brain to eval for stoke or lesions and MRA head for aneurysm due to right palsy MRi brain due to new onset headache after age 33  Labs today  Morning headache, snoring, excessive fatigue, memory:Will refer for sleep evaluation Dr. Brett Fairy. Headaches are in the morning and at night. She snores heavily. She has mild OSA, recommend dental device.  Memory complaints,multifactorial: multiple medications that can affect memory including: meclizine, Xanax, tramadol, gabapentin, trazodone, duloxetine which can have side effects of memory changes. Also depression and anxiety and chronic paincontribute. Had a long discussion about memory and these contributing factors. Formal neurocognitive testing showed normal cognitive functioning.   Vertigo and balance and falls:likely BPPV. May have been worsened after hitting her head, discussed post-concussive symptoms as well, fall risk use walking aids and watch for  falls. Completed vestibular therapy.   Chronic pain:I suggested Cognitive Behavioral Therapy and biofeedback. They decline due to financial constraints.   Tremor:Mild postural tremor. She has a FHx of tremor. Likely essential tremor. Provided litertaue for her to read.   Musculoskeletal neck pain: Tight traps. Causing headaches. Physical Therapy and manual therapy/massage AND dry needling.   Cc: Wayland Salinas, MD  Orders Placed This Encounter  Procedures  . MR BRAIN W WO CONTRAST  . MR MRA HEAD WO CONTRAST  . B. burgdorfi Antibody  . CK  . Sedimentation rate  . Vitamin D, 25-hydroxy  . Gliadin antibodies, serum  . Rheumatoid factor  . Tissue transglutaminase, IgA  . Heavy metals, blood  . Vitamin B6  . Multiple Myeloma Panel (SPEP&IFE w/QIG)  . Vitamin B1  . CBC  . Comprehensive metabolic panel  . Thyroid Panel With TSH  . Acetylcholine receptor, binding  . Acetylcholine receptor, blocking  . Acetylcholine receptor, modulating  . Sjogren's syndrome antibods(ssa + ssb)  . NCV with EMG(electromyography)    Sarina Ill, MD  [facility] [address]  [place], [place] [address]  Phone [phone] Fax [phone]  A total of 74mnutes was spent face-to-face with this patient. Over half this time was spent on counseling patient on the multiple neurologic complaintsdiagnosis and different diagnostic and therapeutic options available.

## 2017-09-12 ENCOUNTER — Ambulatory Visit (INDEPENDENT_AMBULATORY_CARE_PROVIDER_SITE_OTHER): Payer: Medicare Other | Admitting: Physical Therapy

## 2017-09-12 ENCOUNTER — Encounter: Payer: Self-pay | Admitting: Physical Therapy

## 2017-09-12 DIAGNOSIS — M62838 Other muscle spasm: Secondary | ICD-10-CM

## 2017-09-12 DIAGNOSIS — M6281 Muscle weakness (generalized): Secondary | ICD-10-CM | POA: Diagnosis not present

## 2017-09-12 DIAGNOSIS — M542 Cervicalgia: Secondary | ICD-10-CM

## 2017-09-12 NOTE — Patient Instructions (Signed)
TENS stands for Transcutaneous Electrical Nerve Stimulation. In other words, electrical impulses are allowed to pass through the skin in order to excite a nerve.   Purpose and Use of TENS:  TENS is a method used to manage acute and chronic pain without the use of drugs. It has been effective in managing pain associated with surgery, sprains, strains, trauma, rheumatoid arthritis, and neuralgias. It is a non-addictive, low risk, and non-invasive technique used to control pain. It is not, by any means, a curative form of treatment.   How TENS Works:  Most TENS units are a Paramedic unit powered by one 9 volt battery. Attached to the outside of the unit are two lead wires where two pins and/or snaps connect on each wire. All units come with a set of four reusable pads or electrodes. These are placed on the skin surrounding the area involved. By inserting the leads into  the pads, the electricity can pass from the unit making the circuit complete.  As the intensity is turned up slowly, the electrical current enters the body from the electrodes through the skin to the surrounding nerve fibers. This triggers the release of hormones from within the body. These hormones contain pain relievers. By increasing the circulation of these hormones, the person's pain may be lessened. It is also believed that the electrical stimulation itself helps to block the pain messages being sent to the brain, thus also decreasing the body's perception of pain.   Hazards:  TENS units are NOT to be used by patients with PACEMAKERS, DEFIBRILLATORS, DIABETIC PUMPS, PREGNANT WOMEN, and patients with SEIZURE DISORDERS.  TENS units are NOT to be used over the heart, throat, brain, or spinal cord.  One of the major side effects from the TENS unit may be skin irritation. Some people may develop a rash if they are sensitive to the materials used in the electrodes or the connecting wires.   Wear the unit for neck and low back  pain.  2:1 on : off ratio, example 20 min on, 10 min off, 20 min on, 10 off as needed for pain.    Avoid overuse due the body getting used to the stem making it not as effective over time.

## 2017-09-12 NOTE — Therapy (Signed)
[place], [place], [address] Phone: [phone]   Fax:  [phone]  Physical Therapy Treatment  Patient Details  Name: [name] MRN: [id number] Date of Birth: [birth year] Referring Provider: Dr Myrla Halsted  Encounter Date: 09/12/2017      PT End of Session - 09/12/17 1118    Visit Number 7   Number of Visits 14   Date for PT Re-Evaluation 09/27/17   PT Start Time 1016   PT Stop Time 1130   PT Time Calculation (min) 74 min   Activity Tolerance Patient tolerated treatment well      Past Medical History:  Diagnosis Date  . Allergy   . Anemia   . Anxiety    past hx   . Arthritis   . Asthma   . Atrial flutter ([place])    past history- not current  . CAD (coronary artery disease) CARDIOLOGIST--  DR Angelena Form   mild non-obstructive cad  . Cancer ([place])    right  . Cataract    bilaterally removed   . Chronic constipation   . Chronic kidney disease    interstitial cystitis  . COPD (chronic obstructive pulmonary disease) ([place])   . Depression    past hx   . Family history of malignant hyperthermia    father had this  . Fibromyalgia   . Frequency of urination   . GERD (gastroesophageal reflux disease)   . H/O hiatal hernia   . History of basal cell carcinoma excision    X2  . History of breast cancer ONCOLOGIST-- DR Jana Hakim---  NO RECURRANCE   DX 07/2012;  LOW GRADE DCIS  ER+PR+  ----  S/P RIGHT LUMPECTOMY WITH NEGATIVE MARGINS/   RADIATION ENDED 11/2012  . History of chronic bronchitis   . History of colonic polyps   . History of tachycardia    CONTROLLED  WITH ATENOLOL  . Hyperlipidemia   . Hypertension   . Neuromuscular disorder (HCC)    fibromyalgia  . Osteoporosis   . Pelvic pain   . S/P radiation therapy 11/12/12 - 12/05/12   right Breast  . Sepsis ([place]) 2014   from UTI   . Sinus headache   . Urgency of urination     Past Surgical History:  Procedure Laterality Date  .  [place] STUDY N/A 04/03/2016   Procedure: [place] STUDY;  Surgeon: Manus Gunning, MD;  Location: [facility];  Service: Gastroenterology;  Laterality: N/A;  . BREAST LUMPECTOMY Right 10-11-2012   W/ SLN BX  . CARDIAC CATHETERIZATION  09-13-2007  DR Lia Foyer   WELL-PRESERVED LVF/  DIFFUSE SCATTERED CORONARY CALCIFACATION AND ATHEROSCLEROSIS WITHOUT OBSTRUCTION  . CARDIAC CATHETERIZATION  08-04-2010  DR Angelena Form   NON-OBSTRUCTIVE CAD/  pLAD 40%/  oLAD 30%/  mLAD 30%/  pRCA 30%/  EF 60%  . CARDIOVASCULAR STRESS TEST  06-18-2012  DR McALHANY   LOW RISK NUCLEAR STUDY/  SMALL FIXED AREA OF MODERATELY DECREASED UPTAKE IN ANTEROSEPTAL WALL WHICH MAY BE ARTIFACTUAL/  NO ISCHEMIA/  EF 68%  . COLONOSCOPY  09-29-2010  . CYSTOSCOPY    . CYSTOSCOPY WITH HYDRODISTENSION AND BIOPSY N/A 03/06/2014   Procedure: CYSTOSCOPY/HYDRODISTENSION/ INSTILATION OF MARCAINE AND PYRIDIUM;  Surgeon: Ailene Rud, MD;  Location: [facility];  Service: Urology;  Laterality: N/A;  . ESOPHAGEAL MANOMETRY N/A 04/03/2016   Procedure: ESOPHAGEAL MANOMETRY (EM);  Surgeon: Manus Gunning, MD;  Location[facility];  Service: Gastroenterology;  Laterality: N/A;  . NASAL SINUS SURGERY  1985  . ORIF RIGHT ANKLE  FX  2006  . POLYPECTOMY    . REMOVAL VOCAL CORD CYST  FEB 2014  . RIGHT BREAST BX  08-23-2012  . RIGHT HAND SURGERY  X3  LAST ONE 2009   INCLUDES  ORIF RIGHT 5TH FINGER AND REVISION TWICE  . TONSILLECTOMY AND ADENOIDECTOMY  AGE 38  . TOTAL ABDOMINAL HYSTERECTOMY W/ BILATERAL SALPINGOOPHORECTOMY  1982   W/  APPENDECTOMY  . TRANSTHORACIC ECHOCARDIOGRAM  06-24-2012   GRADE I DIASTOLIC DYSFUNCTION/  EF 55-60%/  MILD MR  . UPPER GASTROINTESTINAL ENDOSCOPY      There were no vitals filed for this visit.      Subjective Assessment - 09/12/17 1017    Subjective [name] saw a neurologist, has been scheduled for a nerve conduction study, MRI of the brain, had lots of blood work to  work her up due to her overall body fatigue and pain. She follows up her on 09/19/17.  States her neck has been doing ok however her low back is hurting really bad.    Pertinent History osteoporosis, having some other health issues with her bowels/vagina getting more tests this week.    Patient Stated Goals reduce pain to base line, reduce the tightness she has been feeling to allow her to reduce her pain medication   Currently in Pain? Yes   Pain Score 6    Pain Location Back   Pain Orientation Lower   Pain Descriptors / Indicators Aching;Tightness;Throbbing;Pressure   Pain Type Chronic pain   Pain Radiating Towards legs doing crazy thing at night, feels like its muscle.    Pain Frequency Constant                         [facility] Adult PT Treatment/Exercise - 09/12/17 0001      Self-Care   Self-Care Other Self-Care Comments   Other Self-Care Comments  home TENS unit use and care of unit, electodes and body,      Neck Exercises: Machines for Strengthening   UBE (Upper Arm Bike) L2x4' alt FWD/BWD     Neck Exercises: Prone   Other Prone Exercise 10 reps opposite arm/leg lifts each side   Other Prone Exercise pelvic press with glut sets      Modalities   Modalities Electrical Stimulation;Moist Heat     Moist Heat Therapy   Number Minutes Moist Heat 15 Minutes   Moist Heat Location Lumbar Spine  thoracic     Electrical Stimulation   Electrical Stimulation Location Rt thoracic to lumbar, Lt lumbar   Electrical Stimulation Action used all 5 channels of her home TENs unit   Electrical Stimulation Parameters to tolerance   Electrical Stimulation Goals Tone;Pain     Manual Therapy   Soft tissue mobilization STM to lumbar paraspinals and bilat QL and  Rt thoracic paraspinals and rhomboids          Trigger Point Dry Needling - 09/12/17 1103    Consent Given? Yes   Education Handout Provided No   Muscles Treated Upper Body Longissimus;Quadratus Lumborum  QL bilat    Longissimus Response Palpable increased muscle length;Twitch response elicited  Rt N2-3, very tight                   PT Long Term Goals - 09/12/17 1027      PT LONG TERM GOAL #1   Title independent with HEP (  09/27/17)   Status On-going     PT LONG TERM GOAL #2   Title improve Lt cervical rotation =/> 65 degrees (08/28/17)    Status Achieved     PT LONG TERM GOAL #3   Title report 75% improvement in symtoms (09/27/17)   Status On-going     PT LONG TERM GOAL #4   Title improve FOTO =/< 40% limited, CJ level (10/4 /18)    Status On-going     PT LONG TERM GOAL #5   Title demo upper back strength =/> 4+/5 (09/27/17)    Status Partially Met               Plan - 09/12/17 1114    Clinical Impression Statement [name] is having a lot of medical issues and is being assessed/worked up with blood work and imaging. She continues to have muscular tightness and trigger points throughout her back.  This week the low back and thoracic are worse than the cervical. She has purchased a home TENs unit and will use this.  She was able to change mods and intensity. No goals met.  Main work now is to help decrease her pain and tightness as MD works to [employer] out what is happening with her body.  She did have decrease in pain and increased in tissue flexibilty after treatment.    Rehab Potential Good   PT Frequency 2x / week   PT Duration 4 weeks   PT Treatment/Interventions Dry needling;Taping;Manual techniques;Therapeutic exercise;Moist Heat;Ultrasound;Cryotherapy;Electrical Stimulation;Iontophoresis '4mg'$ /ml Dexamethasone;Patient/family education;Passive range of motion   PT Next Visit Plan pain control, manual work and slowly add in core work.    Consulted and Agree with Plan of Care Patient      Patient will benefit from skilled therapeutic intervention in order to improve the following deficits and impairments:  Decreased range of motion, Increased muscle spasms, Pain, Postural  dysfunction, Decreased strength  Visit Diagnosis: Cervicalgia  Muscle weakness (generalized)  Other muscle spasm     Problem List Patient Active Problem List   Diagnosis Date Noted  . Excessive postexertional fatigue 04/02/2017  . Pneumonia due to virus 04/02/2017  . Chronic interstitial cystitis 02/01/2017  . Acute pharyngitis 01/02/2017  . Dysphagia   . Esophageal reflux   . Allergic rhinitis 01/25/2016  . Shortness of breath   . Left hip pain 04/30/2015  . Sciatica 04/30/2015  . Knee pain, right 04/30/2015  . Leukocytes in urine 04/30/2015  . Hyperlipidemia 03/01/2015  . Bilateral hand numbness 03/01/2015  . Pulmonary nodules 02/12/2015  . External hemorrhoids 02/05/2015  . Vaginitis and vulvovaginitis 02/05/2015  . Chronic night sweats 01/08/2015  . Atypical chest pain 01/01/2015  . Chest pain 01/01/2015  . Renal insufficiency 07/16/2014  . Sepsis secondary to UTI ([place]) 02/05/2014  . Grief reaction 12/29/2013  . Urinary hesitancy 12/04/2013  . Abdominal pain, other specified site 10/26/2013  . Headache(784.0) 10/13/2013  . Pain, joint, multiple sites 08/18/2013  . Vasculitis ([place]) 07/29/2013  . ANA positive 07/29/2013  . Other and unspecified hyperlipidemia 07/12/2013  . Contusion, chest wall 04/30/2013  . Malignant neoplasm of lower-outer quadrant of right breast of female, estrogen receptor positive ([place]) 01/03/2013  . Splenic lesion 09/02/2012  . Asthma, allergic 08/05/2012  . GERD (gastroesophageal reflux disease) 06/03/2012  . CAD (coronary artery disease) 06/03/2012  . Former heavy tobacco smoker 06/03/2012  . Edema 04/08/2012  . Stricture and stenosis of esophagus 10/19/2011  . Constipation - functional 07/21/2011  . Nonspecific (abnormal) findings  on radiological and other examination of biliary tract 07/21/2011  . Osteoporosis 04/17/2011  . POSTMENOPAUSAL STATUS 01/27/2011  . CONSTIPATION 11/02/2010  . FIBROCYSTIC BREAST DISEASE, HX OF  10/18/2010  . CAD, NATIVE VESSEL 08/31/2010  . Essential hypertension 08/25/2010  . CAROTID ARTERY DISEASE 08/25/2010  . TOBACCO ABUSE, HX OF 08/25/2010  . GENERALIZED ANXIETY DISORDER 08/03/2010  . FIBROMYALGIA 01/11/2010  . SKIN CANCER, HX OF 01/11/2010    Jeral Pinch PT  09/12/2017, 11:24 AM  [facility] [place], [place], [address] Phone: [phone]   Fax:  [phone]  Name: [name] MRN: [id number] Date of Birth: Apr 10, 1946

## 2017-09-14 ENCOUNTER — Telehealth: Payer: Self-pay | Admitting: *Deleted

## 2017-09-14 NOTE — Telephone Encounter (Signed)
Patient returned call. Advised her that her labs look great except ENA SSA (RO) Ab (Sjogren's antibodies) that were elevated. Advised her Dr Jaynee Eagles stated it is unclear though what this means, and the rest of her antibodies were normal. Advised her that normal people do sometimes have these antibodies. This RN inquireded if she has seen Rheumatology in the past. She has but it has been 10-12 years ago. She is agreeable to being referred to a rheumatologist. Dr Jaynee Eagles will be notified. Patient verbalized understanding, appreciation.

## 2017-09-14 NOTE — Telephone Encounter (Signed)
LVM requesting call back for lab results. Advised her our office closes at 12 noon today.

## 2017-09-16 ENCOUNTER — Other Ambulatory Visit: Payer: Self-pay | Admitting: Neurology

## 2017-09-16 DIAGNOSIS — M3500 Sicca syndrome, unspecified: Secondary | ICD-10-CM

## 2017-09-16 NOTE — Telephone Encounter (Signed)
FYI

## 2017-09-17 LAB — ACETYLCHOLINE RECEPTOR, BINDING

## 2017-09-17 LAB — RHEUMATOID FACTOR: Rhuematoid fact SerPl-aCnc: <10 IU/mL (ref 0.0–13.9)

## 2017-09-17 LAB — MULTIPLE MYELOMA PANEL, SERUM
ALBUMIN SERPL ELPH-MCNC: 3.6 g/dL (ref 2.9–4.4)
Albumin/Glob SerPl: 1.2 (ref 0.7–1.7)
Alpha 1: 0.2 g/dL (ref 0.0–0.4)
Alpha2 Glob SerPl Elph-Mcnc: 0.7 g/dL (ref 0.4–1.0)
B-GLOBULIN SERPL ELPH-MCNC: 1.1 g/dL (ref 0.7–1.3)
Gamma Glob SerPl Elph-Mcnc: 1 g/dL (ref 0.4–1.8)
Globulin, Total: 3.1 g/dL (ref 2.2–3.9)
IGA/IMMUNOGLOBULIN A, SERUM: 188 mg/dL (ref 64–422)
IGG (IMMUNOGLOBIN G), SERUM: 966 mg/dL (ref 700–1600)
IGM (IMMUNOGLOBULIN M), SRM: 127 mg/dL (ref 26–217)

## 2017-09-17 LAB — COMPREHENSIVE METABOLIC PANEL
A/G RATIO: 1.8 (ref 1.2–2.2)
ALBUMIN: 4.3 g/dL (ref 3.5–4.8)
ALT: 12 IU/L (ref 0–32)
AST: 20 IU/L (ref 0–40)
Alkaline Phosphatase: 86 IU/L (ref 39–117)
BUN / CREAT RATIO: 13 (ref 12–28)
BUN: 14 mg/dL (ref 8–27)
Bilirubin Total: <0.2 mg/dL (ref 0.0–1.2)
CALCIUM: 9.8 mg/dL (ref 8.7–10.3)
CO2: 27 mmol/L (ref 20–29)
CREATININE: 1.04 mg/dL — AB (ref 0.57–1.00)
Chloride: 98 mmol/L (ref 96–106)
GFR, EST AFRICAN AMERICAN: 62 mL/min/{1.73_m2} (ref >59)
GFR, EST NON AFRICAN AMERICAN: 54 mL/min/{1.73_m2} — AB (ref >59)
GLOBULIN, TOTAL: 2.4 g/dL (ref 1.5–4.5)
Glucose: 82 mg/dL (ref 65–99)
POTASSIUM: 4.4 mmol/L (ref 3.5–5.2)
SODIUM: 139 mmol/L (ref 134–144)
Total Protein: 6.7 g/dL (ref 6.0–8.5)

## 2017-09-17 LAB — B. BURGDORFI ANTIBODIES: Lyme IgG/IgM Ab: <0.91 {ISR} (ref 0.00–0.90)

## 2017-09-17 LAB — HEAVY METALS, BLOOD
Arsenic: 6 ug/L (ref 2–23)
LEAD, BLOOD: NOT DETECTED ug/dL (ref 0–4)
Mercury: NOT DETECTED ug/L (ref 0.0–14.9)

## 2017-09-17 LAB — VITAMIN D 25 HYDROXY (VIT D DEFICIENCY, FRACTURES): Vit D, 25-Hydroxy: 32 ng/mL (ref 30.0–100.0)

## 2017-09-17 LAB — CBC
HEMATOCRIT: 38 % (ref 34.0–46.6)
HEMOGLOBIN: 12.2 g/dL (ref 11.1–15.9)
MCH: 30 pg (ref 26.6–33.0)
MCHC: 32.1 g/dL (ref 31.5–35.7)
MCV: 94 fL (ref 79–97)
Platelets: 286 10*3/uL (ref 150–379)
RBC: 4.06 x10E6/uL (ref 3.77–5.28)
RDW: 16.6 % — AB (ref 12.3–15.4)
WBC: 5.9 10*3/uL (ref 3.4–10.8)

## 2017-09-17 LAB — VITAMIN B6: Vitamin B6: 5.8 ug/L (ref 2.0–32.8)

## 2017-09-17 LAB — THYROID PANEL WITH TSH
Free Thyroxine Index: 1.5 (ref 1.2–4.9)
T3 Uptake Ratio: 23 % — ABNORMAL LOW (ref 24–39)
T4, Total: 6.6 ug/dL (ref 4.5–12.0)
TSH: 1.92 u[IU]/mL (ref 0.450–4.500)

## 2017-09-17 LAB — SJOGREN'S SYNDROME ANTIBODS(SSA + SSB): ENA SSA (RO) Ab: 4.9 AI — ABNORMAL HIGH (ref 0.0–0.9)

## 2017-09-17 LAB — TISSUE TRANSGLUTAMINASE, IGA

## 2017-09-17 LAB — GLIADIN ANTIBODIES, SERUM
ANTIGLIADIN ABS, IGA: 2 U (ref 0–19)
Gliadin IgG: 2 units (ref 0–19)

## 2017-09-17 LAB — ACETYLCHOLINE RECEPTOR, BLOCKING: ACETYLCHOL BLOCK AB: 13 % (ref 0–25)

## 2017-09-17 LAB — VITAMIN B1: THIAMINE: 133 nmol/L (ref 66.5–200.0)

## 2017-09-17 LAB — SEDIMENTATION RATE: Sed Rate: 6 mm/hr (ref 0–40)

## 2017-09-17 LAB — CK: CK TOTAL: 35 U/L (ref 24–173)

## 2017-09-17 LAB — ACETYLCHOLINE RECEPTOR, MODULATING

## 2017-09-17 NOTE — Telephone Encounter (Signed)
Spoke with patient and informed her that her Vit D level is within normal range but on low side. Advised her Dr Jaynee Eagles wants her to take Vit D 2000 units daily. Patient stated Dr Jaynee Eagles prescribed Vit D for her, and she is already taking it, once a week. This RN advised she continue that Vit D and discuss when she sees Dr Jaynee Eagles for follow up. She verbalized understanding, appreciation.

## 2017-09-18 DIAGNOSIS — Z853 Personal history of malignant neoplasm of breast: Secondary | ICD-10-CM | POA: Diagnosis not present

## 2017-09-18 DIAGNOSIS — N898 Other specified noninflammatory disorders of vagina: Secondary | ICD-10-CM | POA: Diagnosis not present

## 2017-09-19 ENCOUNTER — Telehealth: Payer: Self-pay | Admitting: Gynecology

## 2017-09-19 NOTE — Telephone Encounter (Signed)
Prolia due after 10/18/17  , Pt wants Dr Phineas Real as new provider. Need to check benefits and will call her when we have information.

## 2017-09-19 NOTE — Telephone Encounter (Signed)
Patient inquiring on her next prolia injection  pc from pt

## 2017-09-20 ENCOUNTER — Ambulatory Visit (INDEPENDENT_AMBULATORY_CARE_PROVIDER_SITE_OTHER): Payer: Medicare Other | Admitting: Neurology

## 2017-09-20 ENCOUNTER — Ambulatory Visit (INDEPENDENT_AMBULATORY_CARE_PROVIDER_SITE_OTHER): Payer: Medicare Other | Admitting: Physical Therapy

## 2017-09-20 ENCOUNTER — Ambulatory Visit (INDEPENDENT_AMBULATORY_CARE_PROVIDER_SITE_OTHER): Payer: Self-pay | Admitting: Neurology

## 2017-09-20 DIAGNOSIS — R531 Weakness: Secondary | ICD-10-CM

## 2017-09-20 DIAGNOSIS — R5382 Chronic fatigue, unspecified: Secondary | ICD-10-CM | POA: Diagnosis not present

## 2017-09-20 DIAGNOSIS — M6281 Muscle weakness (generalized): Secondary | ICD-10-CM

## 2017-09-20 DIAGNOSIS — Z0289 Encounter for other administrative examinations: Secondary | ICD-10-CM

## 2017-09-20 DIAGNOSIS — H4901 Third [oculomotor] nerve palsy, right eye: Secondary | ICD-10-CM

## 2017-09-20 DIAGNOSIS — E538 Deficiency of other specified B group vitamins: Secondary | ICD-10-CM | POA: Diagnosis not present

## 2017-09-20 DIAGNOSIS — H02401 Unspecified ptosis of right eyelid: Secondary | ICD-10-CM

## 2017-09-20 DIAGNOSIS — I671 Cerebral aneurysm, nonruptured: Secondary | ICD-10-CM

## 2017-09-20 DIAGNOSIS — M62838 Other muscle spasm: Secondary | ICD-10-CM | POA: Diagnosis not present

## 2017-09-20 DIAGNOSIS — R51 Headache: Secondary | ICD-10-CM

## 2017-09-20 DIAGNOSIS — R202 Paresthesia of skin: Secondary | ICD-10-CM

## 2017-09-20 DIAGNOSIS — R519 Headache, unspecified: Secondary | ICD-10-CM

## 2017-09-20 DIAGNOSIS — M542 Cervicalgia: Secondary | ICD-10-CM | POA: Diagnosis not present

## 2017-09-20 NOTE — Progress Notes (Signed)
See procedure note.

## 2017-09-20 NOTE — Therapy (Addendum)
[place], [place], [address] Phone: [phone]   Fax:  [phone]  Physical Therapy Treatment  Patient Details  Name: [name] MRN: [id number] Date of Birth: 1946/05/22 Referring Provider: Dr Myrla Halsted  Encounter Date: 09/20/2017      PT End of Session - 09/20/17 1405    Visit Number 8   Number of Visits 14   Date for PT Re-Evaluation 09/27/17   PT Start Time 1406   PT Stop Time 1508   PT Time Calculation (min) 62 min   Activity Tolerance Patient tolerated treatment well      Past Medical History:  Diagnosis Date  . Allergy   . Anemia   . Anxiety    past hx   . Arthritis   . Asthma   . Atrial flutter ([place])    past history- not current  . CAD (coronary artery disease) CARDIOLOGIST--  DR Angelena Form   mild non-obstructive cad  . Cancer ([place])    right  . Cataract    bilaterally removed   . Chronic constipation   . Chronic kidney disease    interstitial cystitis  . COPD (chronic obstructive pulmonary disease) ([place])   . Depression    past hx   . Family history of malignant hyperthermia    father had this  . Fibromyalgia   . Frequency of urination   . GERD (gastroesophageal reflux disease)   . H/O hiatal hernia   . History of basal cell carcinoma excision    X2  . History of breast cancer ONCOLOGIST-- DR Jana Hakim---  NO RECURRANCE   DX 07/2012;  LOW GRADE DCIS  ER+PR+  ----  S/P RIGHT LUMPECTOMY WITH NEGATIVE MARGINS/   RADIATION ENDED 11/2012  . History of chronic bronchitis   . History of colonic polyps   . History of tachycardia    CONTROLLED  WITH ATENOLOL  . Hyperlipidemia   . Hypertension   . Neuromuscular disorder (HCC)    fibromyalgia  . Osteoporosis   . Pelvic pain   . S/P radiation therapy 11/12/12 - 12/05/12   right Breast  . Sepsis ([place]) 2014   from UTI   . Sinus headache   . Urgency of urination     Past Surgical History:  Procedure Laterality Date  .  [place] STUDY N/A 04/03/2016   Procedure: [place] STUDY;  Surgeon: Manus Gunning, MD;  [facility];  Service: Gastroenterology;  Laterality: N/A;  . BREAST LUMPECTOMY Right 10-11-2012   W/ SLN BX  . CARDIAC CATHETERIZATION  09-13-2007  DR Lia Foyer   WELL-PRESERVED LVF/  DIFFUSE SCATTERED CORONARY CALCIFACATION AND ATHEROSCLEROSIS WITHOUT OBSTRUCTION  . CARDIAC CATHETERIZATION  08-04-2010  DR Angelena Form   NON-OBSTRUCTIVE CAD/  pLAD 40%/  oLAD 30%/  mLAD 30%/  pRCA 30%/  EF 60%  . CARDIOVASCULAR STRESS TEST  06-18-2012  DR McALHANY   LOW RISK NUCLEAR STUDY/  SMALL FIXED AREA OF MODERATELY DECREASED UPTAKE IN ANTEROSEPTAL WALL WHICH MAY BE ARTIFACTUAL/  NO ISCHEMIA/  EF 68%  . COLONOSCOPY  09-29-2010  . CYSTOSCOPY    . CYSTOSCOPY WITH HYDRODISTENSION AND BIOPSY N/A 03/06/2014   Procedure: CYSTOSCOPY/HYDRODISTENSION/ INSTILATION OF MARCAINE AND PYRIDIUM;  Surgeon: Ailene Rud, MD;  Location: [facility];  Service: Urology;  Laterality: N/A;  . ESOPHAGEAL MANOMETRY N/A 04/03/2016   Procedure: ESOPHAGEAL MANOMETRY (EM);  Surgeon: Manus Gunning, MD;  [facility];  Service: Gastroenterology;  Laterality: N/A;  . NASAL SINUS SURGERY  1985  . ORIF RIGHT ANKLE  FX  2006  . POLYPECTOMY    . REMOVAL VOCAL CORD CYST  FEB 2014  . RIGHT BREAST BX  08-23-2012  . RIGHT HAND SURGERY  X3  LAST ONE 2009   INCLUDES  ORIF RIGHT 5TH FINGER AND REVISION TWICE  . TONSILLECTOMY AND ADENOIDECTOMY  AGE 61  . TOTAL ABDOMINAL HYSTERECTOMY W/ BILATERAL SALPINGOOPHORECTOMY  1982   W/  APPENDECTOMY  . TRANSTHORACIC ECHOCARDIOGRAM  06-24-2012   GRADE I DIASTOLIC DYSFUNCTION/  EF 55-60%/  MILD MR  . UPPER GASTROINTESTINAL ENDOSCOPY      There were no vitals filed for this visit.      Subjective Assessment - 09/20/17 1406    Subjective Pt had her nerve conduction test this morning and felt a lot with it.  MD told her that the muscles are fine, has some  neuropathies through out her body. No carpal tunnel, sjogerns dx syndrome - will see rheumotologist in Dec to discuss medication to help with this.  Increasing vit D, b12 shots and some other changes.    Patient Stated Goals reduce pain to base line, reduce the tightness she has been feeling to allow her to reduce her pain medication   Currently in Pain? Yes   Pain Score --  unable to really put a number on it, feels very fatigued and heavy, tired.    Pain Location Shoulder   Pain Orientation Right   Pain Type Chronic pain   Pain Onset More than a month ago   Pain Frequency Constant   Aggravating Factors  sore today from nerve conduction testing this AM            [facility] PT Assessment - 09/20/17 0001      Assessment   Medical Diagnosis Musculoskeletal neck pain   Referring Provider Dr Myrla Halsted   Onset Date/Surgical Date 06/17/17   Hand Dominance Right     AROM   AROM Assessment Site Cervical                     [facility] Adult PT Treatment/Exercise - 09/20/17 0001      Neck Exercises: Machines for Strengthening   UBE (Upper Arm Bike) L1 x4' BWD only     Modalities   Modalities Electrical Stimulation;Moist Heat     Moist Heat Therapy   Number Minutes Moist Heat 15 Minutes   Moist Heat Location Cervical;Lumbar Spine  thoracic     Electrical Stimulation   Electrical Stimulation Location thoracic area   Electrical Stimulation Action IFC   Electrical Stimulation Parameters to tolerance   Electrical Stimulation Goals Tone;Pain     Manual Therapy   Soft tissue mobilization STM to Rt rhomboids     Neck Exercises: Stretches   Other Neck Stretches doorway strap stretches for chest, biceps, upper back and triceps/lats          Trigger Point Dry Needling - 09/20/17 1434    Consent Given? Yes   Education Handout Provided No   Muscles Treated Upper Body Rhomboids   Rhomboids Response Palpable increased muscle length;Twitch response elicited  Rt                    PT Long Term Goals - 09/20/17 1426      PT LONG TERM GOAL #1   Title independent with HEP (09/27/17)   Status On-going     PT LONG  TERM GOAL #2   Title improve Lt cervical rotation =/> 65 degrees (08/28/17)    Status Achieved     PT LONG TERM GOAL #3   Title report 75% improvement in symtoms (09/27/17)   Status On-going     PT LONG TERM GOAL #4   Title improve FOTO =/< 40% limited, CJ level (10/4 /18)    Status On-going     PT LONG TERM GOAL #5   Title demo upper back strength =/> 4+/5 (09/27/17)    Status Partially Met               Plan - 09/20/17 1452    Clinical Impression Statement [name] has increased pain today after having a nerve conduction test performed.  the trigger point in her Rt rhomboids was larger today as compared to last visit. No news goals met, She is having more medical procedures next week and may require surgery.    Rehab Potential Good   PT Frequency 2x / week   PT Duration 4 weeks   PT Treatment/Interventions Dry needling;Taping;Manual techniques;Therapeutic exercise;Moist Heat;Ultrasound;Cryotherapy;Electrical Stimulation;Iontophoresis '4mg'$ /ml Dexamethasone;Patient/family education;Passive range of motion   PT Next Visit Plan pain control, manual work and slowly add in core work.    Consulted and Agree with Plan of Care Patient      Patient will benefit from skilled therapeutic intervention in order to improve the following deficits and impairments:  Decreased range of motion, Increased muscle spasms, Pain, Postural dysfunction, Decreased strength  Visit Diagnosis: Cervicalgia  Muscle weakness (generalized)  Other muscle spasm     Problem List Patient Active Problem List   Diagnosis Date Noted  . Excessive postexertional fatigue 04/02/2017  . Pneumonia due to virus 04/02/2017  . Chronic interstitial cystitis 02/01/2017  . Acute pharyngitis 01/02/2017  . Dysphagia   . Esophageal reflux   . Allergic  rhinitis 01/25/2016  . Shortness of breath   . Left hip pain 04/30/2015  . Sciatica 04/30/2015  . Knee pain, right 04/30/2015  . Leukocytes in urine 04/30/2015  . Hyperlipidemia 03/01/2015  . Bilateral hand numbness 03/01/2015  . Pulmonary nodules 02/12/2015  . External hemorrhoids 02/05/2015  . Vaginitis and vulvovaginitis 02/05/2015  . Chronic night sweats 01/08/2015  . Atypical chest pain 01/01/2015  . Chest pain 01/01/2015  . Renal insufficiency 07/16/2014  . Sepsis secondary to UTI ([place]) 02/05/2014  . Grief reaction 12/29/2013  . Urinary hesitancy 12/04/2013  . Abdominal pain, other specified site 10/26/2013  . Headache(784.0) 10/13/2013  . Pain, joint, multiple sites 08/18/2013  . Vasculitis ([place]) 07/29/2013  . ANA positive 07/29/2013  . Other and unspecified hyperlipidemia 07/12/2013  . Contusion, chest wall 04/30/2013  . Malignant neoplasm of lower-outer quadrant of right breast of female, estrogen receptor positive ([place]) 01/03/2013  . Splenic lesion 09/02/2012  . Asthma, allergic 08/05/2012  . GERD (gastroesophageal reflux disease) 06/03/2012  . CAD (coronary artery disease) 06/03/2012  . Former heavy tobacco smoker 06/03/2012  . Edema 04/08/2012  . Stricture and stenosis of esophagus 10/19/2011  . Constipation - functional 07/21/2011  . Nonspecific (abnormal) findings on radiological and other examination of biliary tract 07/21/2011  . Osteoporosis 04/17/2011  . POSTMENOPAUSAL STATUS 01/27/2011  . CONSTIPATION 11/02/2010  . FIBROCYSTIC BREAST DISEASE, HX OF 10/18/2010  . CAD, NATIVE VESSEL 08/31/2010  . Essential hypertension 08/25/2010  . CAROTID ARTERY DISEASE 08/25/2010  . TOBACCO ABUSE, HX OF 08/25/2010  . GENERALIZED ANXIETY DISORDER 08/03/2010  . FIBROMYALGIA 01/11/2010  . SKIN CANCER, HX OF 01/11/2010  Manuela Schwartz Azalya Galyon PT  09/20/2017, 2:57 PM  [facility] [place], [place],  [address] Phone: [phone]   Fax:  [phone]  Name: [name] MRN: [id number] Date of Birth: 1946-03-27   PHYSICAL THERAPY DISCHARGE SUMMARY  Visits from Start of Care: 8  Current functional level related to goals / functional outcomes: unknown   Remaining deficits: unknown   Education / Equipment: HEP Plan:                                                    Patient goals were partially met. Patient is being discharged due to a change in medical status.  ?????Pt is undergoing further testing and assessment for medical diagnosis.     Jeral Pinch, PT 11/07/17 10:38 AM

## 2017-09-20 NOTE — Patient Instructions (Signed)
Sjogren Syndrome Sjogren syndrome is a disease in which the body's disease-fighting system (immune system) attacks the glands that produce tears (lacrimal glands) and the glands that produce saliva (salivary glands). This makes the eyes and mouth very dry. Sjogren syndrome is a long-term (chronic) disorder that has no cure. In some cases, it is linked to other disorders (rheumatic disorders), such as rheumatoid arthritis and systemic lupus erythematosus (SLE). It may affect other parts of the body, such as:  Kidneys.  Blood vessels.  Joints.  Lungs.  Liver.  Pancreas.  Brain.  Nerves.  Spinal cord.  What are the causes? The cause of this condition is not known. It may be passed along from parent to child (inherited), or it may be a symptom of a rheumatic disorder. What increases the risk? This condition is more likely to develop in:  Women.  People who are 45-50 years old.  People who have recently had a viral infection or currently have a viral infection.  What are the signs or symptoms? The main symptoms of this condition are:  Dry mouth. This may include: ? A chalky feeling. ? Difficulty swallowing, speaking, or tasting. ? Frequent cavities in teeth. ? Frequent mouth infections.  Dry eyes. This may include: ? Burning, redness, and itching. ? Blurry vision. ? Light sensitivity.  Other symptoms may include:  Dryness of the skin and the inside of the nose.  Eyelid infections.  Vaginal dryness, if this applies.  Joint pain and stiffness.  Muscle pain and stiffness.  How is this diagnosed? This condition is diagnosed based on:  Your symptoms.  Your medical history.  A physical exam of your eyes and mouth.  You may have tests, including:  Schirmer test. This tests your tear production.  An eye exam that is done with a magnifying device (slit-lamp exam).  An eye test that temporarily stains your eye with dye. This shows the extent of eye  damage.  Tests to check your salivary gland function.  Biopsy. This is a removal of part of a salivary gland from inside your lower lip to be studied under a microscope.  Chest X-rays.  Blood tests.  Urine tests.  How is this treated? There is no cure for this condition, but treatment can help you manage your symptoms. Treatment options may include:  Moisture replacement therapies to help relieve dryness in your skin, mouth, and eyes.  NSAIDs to help relieve pain and stiffness.  Medicines to help relieve inflammation in your body(corticosteroids). These are usually for severe cases.  Medicines to help reduce the activity of your immune system (immunosuppressants).  Surgery or insertion of plugs to close the lacrimal glands (punctal occlusion). Thishelps keep more natural tears in your eyes.  Follow these instructions at home:  Take over-the-counter and prescription medicines only as told by your health care provider.  Take these actions to care for your eyes: ? Use eye drops as told by your health care provider. ? Blink at least 5-6 times a minute. ? Protect your eyes from drafts and breezes. ? Maintain properly humidified air. You may want to use a humidifier at home. ? Avoid smoke.  Take these actions to care for your mouth: ? Brush your teeth and floss after every meal. ? Chew sugar-free gum or suck on hard candy. For some people, this can help to relieve dry mouth. ? Use antimicrobial mouthwash daily. ? Take frequent sips of water or sugar-free drinks. ? Use saliva substitutes or lip balm as told   by your health care provider.  Drink enough fluid to keep your urine clear or pale yellow.  Schedule and attend dentist visits every six months.  Keep all follow-up visits as told by your health care provider. This is important. Contact a health care provider if:  You have a fever.  You have night sweats.  You are always tired.  You have unexplained weight  loss.  You develop itchy skin.  You have red patches on your skin.  You have a lump or swelling on your neck. This information is not intended to replace advice given to you by your health care provider. Make sure you discuss any questions you have with your health care provider. Document Released: 12/01/2002 Document Revised: 08/06/2016 Document Reviewed: 08/19/2015 Elsevier Interactive Patient Education  2018 Elsevier Inc.  

## 2017-09-20 NOTE — Progress Notes (Signed)
Full Name: [name] Gender: Female MRN #: [id number] Date of Birth: 06-06-1946    Visit Date: 09/20/2017 10:08 Age: 71 Years 2 Months Old Examining Physician: Sarina Ill, MD  Referring Physician: Jaynee Eagles, MD  History: Patient with weakness and paresthesias  Summary: EMG/NCS was performed on all 4 extremities    Conclusion: There is electrophysiologic evidence of length-dependent, axonal, sensorimotor peripheral polyneuropathy. No suggestion of radiculopathy, myopathy/myositis, neuromuscular disorder.  Sarina Ill, M.D.  [facility] [place],  [address] Tel: [phone] Fax: [phone]        [facility]    Nerve / Sites Muscle Latency Ref. Amplitude Ref. Rel Amp Segments Distance Velocity Ref. Area    ms ms mV mV %  cm m/s m/s mVms  R Median - APB     Wrist APB 3.8 ?4.4 4.3 ?4.0 100 Wrist - APB 7   16.8     Upper arm APB 8.2  4.1  95.6 Upper arm - Wrist 20 46 ?49 16.8  L Median - APB     Wrist APB 3.6 ?4.4 6.3 ?4.0 100 Wrist - APB 7   29.5     Upper arm APB 7.9  6.2  97.7 Upper arm - Wrist 20 47 ?49 28.6  R Ulnar - ADM     Wrist ADM 3.4 ?3.3 10.3 ?6.0 100 Wrist - ADM 7   31.5     B.Elbow ADM 7.0  9.6  93.1 B.Elbow - Wrist 17 47 ?49 30.2     A.Elbow ADM 9.1  9.1  94.5 A.Elbow - B.Elbow 10 48 ?49 30.4         A.Elbow - Wrist      L Ulnar - ADM     Wrist ADM 3.1 ?3.3 9.9 ?6.0 100 Wrist - ADM 7   27.1     B.Elbow ADM 6.9  8.9  89.8 B.Elbow - Wrist 18 47 ?49 28.8     A.Elbow ADM 9.1  8.3  93.4 A.Elbow - B.Elbow 10 46 ?49 28.7         A.Elbow - Wrist      R Peroneal - EDB     Ankle EDB 5.9 ?6.5 1.4 ?2.0 100 Ankle - EDB 9   5.1     Fib head EDB 14.2  1.4  103 Fib head - Ankle 34 41 ?44 5.7     Pop fossa EDB 16.1  1.7  119 Pop fossa - Fib head 8 42 ?44 6.7         Pop fossa - Ankle      L Peroneal - EDB     Ankle EDB 6.6 ?6.5 0.7 ?2.0 100 Ankle - EDB 9   2.1     Fib head EDB 14.5  0.4  61.7 Fib head - Ankle 34 43 ?44 1.6     Pop  fossa EDB 16.8  0.6  141 Pop fossa - Fib head 10 43 ?44 2.1         Pop fossa - Ankle      R Tibial - AH     Ankle AH 6.1 ?5.8 0.6 ?4.0 100 Ankle - AH 9   8.6     Pop fossa AH 15.9  0.5  80.1 Pop fossa - Ankle 36 37 ?41 3.6  L Tibial - AH     Ankle AH 5.9 ?5.8 2.0 ?4.0 100 Ankle - AH 9   7.2  Pop fossa AH 16.6  1.7  87.1 Pop fossa - Ankle 36 34 ?41 4.7                     SNC    Nerve / Sites Rec. Site Peak Lat Ref.  Amp Ref. Segments Distance Peak Diff Ref.    ms ms V V  cm ms ms  R Radial - Anatomical snuff box (Forearm)     Forearm Wrist 3.0 ?2.9 18 ?15 Forearm - Wrist 10    L Radial - Anatomical snuff box (Forearm)     Forearm Wrist 3.1 ?2.9 21 ?15 Forearm - Wrist 10    R Sural - Ankle (Calf)     Calf Ankle 4.0 ?4.4 3 ?6 Calf - Ankle 14    L Sural - Ankle (Calf)     Calf Ankle 4.1 ?4.4 3 ?6 Calf - Ankle 14    L Superficial peroneal - Ankle     Lat leg Ankle NR ?4.4 NR ?6 Lat leg - Ankle 14    R Superficial peroneal - Ankle     Lat leg Ankle NR ?4.4 NR ?6 Lat leg - Ankle 14    R Median, Ulnar - Transcarpal comparison     Median Palm Wrist 2.4 ?2.2 30 ?35 Median Palm - Wrist 8       Ulnar Palm Wrist 2.5 ?2.2 13 ?12 Ulnar Palm - Wrist 8          Median Palm - Ulnar Palm  -0.1 ?0.4  L Median, Ulnar - Transcarpal comparison     Median Palm Wrist 2.3 ?2.2 34 ?35 Median Palm - Wrist 8       Ulnar Palm Wrist 2.4 ?2.2 13 ?12 Ulnar Palm - Wrist 8          Median Palm - Ulnar Palm  -0.1 ?0.4  R Median - Orthodromic (Dig II, Mid palm)     Dig II Wrist 3.8 ?3.4 7 ?10 Dig II - Wrist 13    L Median - Orthodromic (Dig II, Mid palm)     Dig II Wrist 3.4 ?3.4 10 ?10 Dig II - Wrist 13    R Ulnar - Orthodromic, (Dig V, Mid palm)     Dig V Wrist 3.2 ?3.1 6 ?5 Dig V - Wrist 11    L Ulnar - Orthodromic, (Dig V, Mid palm)     Dig V Wrist 3.2 ?3.1 7 ?5 Dig V - Wrist 69                                F  Wave    Nerve F Lat Ref.   ms ms  R Tibial - AH 60.3 ?56.0  R Ulnar - ADM 29.0  ?32.0  L Ulnar - ADM 30.3 ?32.0  L Tibial - AH 64.5 ?56.0             EMG full       EMG Summary Table    Spontaneous MUAP Recruitment  Muscle IA Fib PSW Fasc Other Amp Dur. Poly Pattern  R. Deltoid Normal None None None _______ Normal Normal Normal Normal  R. Triceps brachii Normal None None None _______ Normal Normal Normal Normal  R. Pronator teres Normal None None None _______ Normal Normal Normal Normal  R. First dorsal interosseous Normal None None None _______ Normal Normal Normal Normal  R. Opponens pollicis Normal  None None None _______ Normal Normal Normal Normal  R. Iliopsoas Normal None None None _______ Normal Normal Normal Normal  R. Vastus medialis Normal None None None _______ Normal Normal Normal Normal  R. Tibialis anterior Normal None None None _______ Normal Normal Normal Normal  R. Gastrocnemius (Medial head) Normal None None None _______ Normal Normal Normal Normal  R. Biceps femoris (long head) Normal None None None _______ Normal Normal Normal Normal  R. Cervical paraspinals (low) Normal None None None _______ Normal Normal Normal Normal  R. Cervical paraspinals (low) Normal None None None _______ Normal Normal Normal Normal  R. Lumbar paraspinals (low) Normal None None None _______ Normal Normal Normal Normal  R. Gluteus maximus Normal None None None _______ Normal Normal Normal Normal  R. Gluteus medius Normal None None None _______ Normal Normal Normal Normal

## 2017-09-21 ENCOUNTER — Other Ambulatory Visit: Payer: Self-pay | Admitting: Surgery

## 2017-09-21 DIAGNOSIS — N898 Other specified noninflammatory disorders of vagina: Secondary | ICD-10-CM

## 2017-09-23 ENCOUNTER — Other Ambulatory Visit: Payer: Self-pay | Admitting: Neurology

## 2017-09-23 DIAGNOSIS — E538 Deficiency of other specified B group vitamins: Secondary | ICD-10-CM

## 2017-09-23 LAB — METHYLMALONIC ACID, SERUM: METHYLMALONIC ACID: 1201 nmol/L — AB (ref 0–378)

## 2017-09-23 LAB — B12 AND FOLATE PANEL
FOLATE: 7 ng/mL (ref >3.0)
VITAMIN B 12: 429 pg/mL (ref 232–1245)

## 2017-09-23 LAB — MAGNESIUM: Magnesium: 2.2 mg/dL (ref 1.6–2.3)

## 2017-09-24 ENCOUNTER — Telehealth: Payer: Self-pay | Admitting: Neurology

## 2017-09-24 ENCOUNTER — Encounter: Payer: Self-pay | Admitting: Physical Therapy

## 2017-09-24 NOTE — Telephone Encounter (Signed)
-----   Message from Lester Buckner, RN sent at 09/24/2017  1:56 PM EDT -----   ----- Message ----- From: Melvenia Beam, MD Sent: 09/23/2017   9:46 AM To: Lester Oakwood, RN  Kristin, Patient's methylmalonic acid is very elevated, not sure if this is an error but it can indicate B12 deficiency despite a normal B12 and folate. I need to check it again I have ordered the labs if she can come in and have them completed thanks.  FYI: An elevated MMA is a very sensitive and specific marker of B12 deficiency. There is evidence that up to 10% of individuals who have normal or low normal B12 values (between 150 and 400 pg/mL) may develop neurop sequelae of B12 deficiency without any evidence of megaloblastic anemia. The MMA test can identify this population of functionally B12-deficient individuals. In these patients, oral cyanocobalamin 1-2 mg/day will normalize the MMA and hopefully improve their symptoms

## 2017-09-24 NOTE — Telephone Encounter (Signed)
Called patient to go over lab work. No answer. LVM for pt to return call.

## 2017-09-26 ENCOUNTER — Other Ambulatory Visit: Payer: Medicare Other

## 2017-09-27 ENCOUNTER — Encounter: Payer: Self-pay | Admitting: Physical Therapy

## 2017-10-02 ENCOUNTER — Encounter: Payer: Medicare Other | Admitting: Physical Therapy

## 2017-10-09 ENCOUNTER — Ambulatory Visit
Admission: RE | Admit: 2017-10-09 | Discharge: 2017-10-09 | Disposition: A | Discharge status: Home / Self Care | Payer: Medicare Other | Source: Ambulatory Visit | Attending: Surgery | Admitting: Surgery

## 2017-10-09 DIAGNOSIS — N898 Other specified noninflammatory disorders of vagina: Secondary | ICD-10-CM

## 2017-10-10 ENCOUNTER — Ambulatory Visit
Admission: RE | Admit: 2017-10-10 | Discharge: 2017-10-10 | Disposition: A | Discharge status: Home / Self Care | Payer: Medicare Other | Source: Ambulatory Visit | Attending: Neurology | Admitting: Neurology

## 2017-10-10 ENCOUNTER — Other Ambulatory Visit: Payer: Medicare Other

## 2017-10-10 DIAGNOSIS — I671 Cerebral aneurysm, nonruptured: Secondary | ICD-10-CM

## 2017-10-10 DIAGNOSIS — R51 Headache: Secondary | ICD-10-CM

## 2017-10-10 DIAGNOSIS — H4901 Third [oculomotor] nerve palsy, right eye: Secondary | ICD-10-CM

## 2017-10-10 DIAGNOSIS — H02401 Unspecified ptosis of right eyelid: Secondary | ICD-10-CM

## 2017-10-10 DIAGNOSIS — R5382 Chronic fatigue, unspecified: Secondary | ICD-10-CM

## 2017-10-10 DIAGNOSIS — R531 Weakness: Secondary | ICD-10-CM

## 2017-10-10 DIAGNOSIS — R519 Headache, unspecified: Secondary | ICD-10-CM

## 2017-10-10 DIAGNOSIS — R29818 Other symptoms and signs involving the nervous system: Secondary | ICD-10-CM | POA: Diagnosis not present

## 2017-10-10 MED ORDER — GADOBENATE DIMEGLUMINE 529 MG/ML IV SOLN
10.0000 mL | Freq: Once | INTRAVENOUS | Status: AC | PRN
  Administered 2017-10-10: 10 mL via INTRAVENOUS

## 2017-10-12 ENCOUNTER — Telehealth: Payer: Self-pay | Admitting: *Deleted

## 2017-10-12 NOTE — Telephone Encounter (Signed)
LVM requesting call back for MRA head results. Advised the office closes at noon today.

## 2017-10-12 NOTE — Telephone Encounter (Signed)
LVM #2 informing patient that Dr Jaynee Eagles may have released her MRA results to my chart. Advised the office I snow closed; left number.

## 2017-10-15 DIAGNOSIS — H524 Presbyopia: Secondary | ICD-10-CM | POA: Diagnosis not present

## 2017-10-16 NOTE — Telephone Encounter (Signed)
Spoke with patient and informed her that her MRS head images are normal for her age and not changed since the last images in 2014. Patient stated she had read the report on my chart. She then stated she did have some abnormal lab results, had extensive resting done and would like to see Dr Jaynee Eagles again to discuss her test results. She also stated she is taking prescription strength Vit D and only has a fewweeks left on prescription. This RN was unable to find opening in schedule for several weeks, so advised will send to Dr Cathren Laine RN to discuss with Dr Jaynee Eagles and schedule her for follow up.  Patient verbalized understanding, appreciation.

## 2017-10-17 NOTE — Telephone Encounter (Signed)
Called patient, she said she does not need to see Dr. Jaynee Eagles in office but will call back with her questions that she had about her recent tests.

## 2017-10-22 DIAGNOSIS — I10 Essential (primary) hypertension: Secondary | ICD-10-CM | POA: Diagnosis not present

## 2017-10-23 NOTE — Telephone Encounter (Signed)
Okay for Prolia.

## 2017-10-23 NOTE — Telephone Encounter (Addendum)
Prolia due 10/18/17, Ins benefits No deductible Co pay $40  No PA, M81.0, OOPM $6700 ($1947 met). I will verify continued Prolia due to pt was a previous pt of J .Toney Rakes and she has chosen Dr Phineas Real as provider. PC to pt she is coming in 11/01/17 to see Dr Phineas Real and would like to have Prolia injection at that time. I will inform Dr Loetta Rough

## 2017-10-24 NOTE — Progress Notes (Signed)
HPI: FU CAD. Cardiac catheterization in August 2011 showed a 40% proximal LAD, 30% mid LAD, 30% RCA and her ejection fraction was 60%. Nuclear study in June 2013 showed no ischemia with an ejection fraction of 68%. Echocardiogram July 2013 showed normal LV function, grade 1 diastolic dysfunction, mild mitral regurgitation, trace aortic insufficiency and mildly elevated pulmonary pressures. Nuclear study March 2016 showed ejection fraction 67%. Breast attenuation but no ischemia. MRA of the head October 2018 normal. Since last seen occasional dyspnea on exertion. No orthopnea, PND, chest pain or syncope.   Current Outpatient Prescriptions  Medication Sig Dispense Refill  . acetaminophen (TYLENOL) 500 MG tablet Take 1,000 mg by mouth every 6 (six) hours as needed for mild pain or headache.     . albuterol (PROVENTIL HFA;VENTOLIN HFA) 108 (90 BASE) MCG/ACT inhaler Inhale 2 puffs into the lungs every 4 (four) hours as needed. 1 Inhaler 5  . aspirin EC 81 MG tablet Take 1 tablet (81 mg total) by mouth daily. 90 tablet 3  . atenolol (TENORMIN) 25 MG tablet Take 25 mg by mouth daily.    . DULoxetine (CYMBALTA) 60 MG capsule TAKE 1 BY MOUTH TWICE DAILY 180 capsule 1  . ergocalciferol (VITAMIN D2) 50000 units capsule Take 1 capsule (50,000 Units total) by mouth once a week. 10 capsule 0  . Estradiol 10 MCG TABS vaginal tablet Place 1 tablet (10 mcg total) vaginally 2 (two) times a week. 8 tablet 11  . fluticasone (FLONASE) 50 MCG/ACT nasal spray Place 1 spray into the nose as needed for allergies.     . furosemide (LASIX) 20 MG tablet Take 1 tablet (20 mg total) by mouth every morning. 180 tablet 1  . gabapentin (NEURONTIN) 300 MG capsule Take 1 capsule (300 mg total) by mouth 2 (two) times daily. 60 capsule 6  . loratadine (CLARITIN) 10 MG tablet Take 1 tablet (10 mg total) by mouth daily. 30 tablet 11  . losartan (COZAAR) 25 MG tablet Take 25 mg by mouth daily.     Marland Kitchen omeprazole (PRILOSEC) 40 MG  capsule Take 40 mg by mouth 2 (two) times daily.    . pentosan polysulfate (ELMIRON) 100 MG capsule Take 1 capsule (100 mg total) by mouth 3 (three) times daily. Reported on 01/05/2016 (Patient taking differently: Take 100 mg by mouth 3 (three) times daily with meals as needed. Reported on 01/05/2016) 90 capsule 11  . traMADol (ULTRAM) 50 MG tablet Take 1-2 tablets (50-100 mg total) by mouth 3 (three) times daily as needed for moderate pain. (Patient taking differently: Take 50-100 mg by mouth 3 (three) times daily as needed for moderate pain (pt uses TID). ) 180 tablet 0  . traZODone (DESYREL) 100 MG tablet Take 1 tablet (100 mg total) by mouth at bedtime. 180 tablet 1   Current Facility-Administered Medications  Medication Dose Route Frequency Provider Last Rate Last Dose  . 0.9 %  sodium chloride infusion  500 mL Intravenous Continuous Armbruster, Carlota Raspberry, MD      . 0.9 %  sodium chloride infusion  500 mL Intravenous Continuous Armbruster, Carlota Raspberry, MD       Facility-Administered Medications Ordered in Other Visits  Medication Dose Route Frequency Provider Last Rate Last Dose  . bupivacaine (MARCAINE) 0.5 % 10 mL, triamcinolone acetonide (KENALOG-40) 40 mg injection   Subcutaneous Once Carolan Clines, MD      . bupivacaine (MARCAINE) 0.5 % 15 mL, phenazopyridine (PYRIDIUM) 400 mg bladder mixture  Bladder Instillation Once Carolan Clines, MD         Past Medical History:  Diagnosis Date  . Allergy   . Anemia   . Anxiety    past hx   . Arthritis   . Asthma   . Atrial flutter ([place])    past history- not current  . CAD (coronary artery disease) CARDIOLOGIST--  DR Angelena Form   mild non-obstructive cad  . Cancer ([place])    right  . Cataract    bilaterally removed   . Chronic constipation   . Chronic kidney disease    interstitial cystitis  . COPD (chronic obstructive pulmonary disease) ([place])   . Depression    past hx   . Family history of malignant hyperthermia    father had  this  . Fibromyalgia   . Frequency of urination   . GERD (gastroesophageal reflux disease)   . H/O hiatal hernia   . History of basal cell carcinoma excision    X2  . History of breast cancer ONCOLOGIST-- DR Jana Hakim---  NO RECURRANCE   DX 07/2012;  LOW GRADE DCIS  ER+PR+  ----  S/P RIGHT LUMPECTOMY WITH NEGATIVE MARGINS/   RADIATION ENDED 11/2012  . History of chronic bronchitis   . History of colonic polyps   . History of tachycardia    CONTROLLED  WITH ATENOLOL  . Hyperlipidemia   . Hypertension   . Neuromuscular disorder ([facility])    fibromyalgia  . Osteoporosis   . Pelvic pain   . S/P radiation therapy 11/12/12 - 12/05/12   right Breast  . Sepsis ([place]) 2014   from UTI   . Sinus headache   . Urgency of urination     Past Surgical History:  Procedure Laterality Date  . [place] STUDY N/A 04/03/2016   Procedure: [place] STUDY;  Surgeon: Manus Gunning, MD;  Location: [facility];  Service: Gastroenterology;  Laterality: N/A;  . BREAST LUMPECTOMY Right 10-11-2012   W/ SLN BX  . CARDIAC CATHETERIZATION  09-13-2007  DR Lia Foyer   WELL-PRESERVED LVF/  DIFFUSE SCATTERED CORONARY CALCIFACATION AND ATHEROSCLEROSIS WITHOUT OBSTRUCTION  . CARDIAC CATHETERIZATION  08-04-2010  DR Angelena Form   NON-OBSTRUCTIVE CAD/  pLAD 40%/  oLAD 30%/  mLAD 30%/  pRCA 30%/  EF 60%  . CARDIOVASCULAR STRESS TEST  06-18-2012  DR McALHANY   LOW RISK NUCLEAR STUDY/  SMALL FIXED AREA OF MODERATELY DECREASED UPTAKE IN ANTEROSEPTAL WALL WHICH MAY BE ARTIFACTUAL/  NO ISCHEMIA/  EF 68%  . COLONOSCOPY  09-29-2010  . CYSTOSCOPY    . CYSTOSCOPY WITH HYDRODISTENSION AND BIOPSY N/A 03/06/2014   Procedure: CYSTOSCOPY/HYDRODISTENSION/ INSTILATION OF MARCAINE AND PYRIDIUM;  Surgeon: Ailene Rud, MD;  Location[facility];  Service: Urology;  Laterality: N/A;  . ESOPHAGEAL MANOMETRY N/A 04/03/2016   Procedure: ESOPHAGEAL MANOMETRY (EM);  Surgeon: Manus Gunning, MD;  Location: [facility]  ENDOSCOPY;  Service: Gastroenterology;  Laterality: N/A;  . NASAL SINUS SURGERY  1985  . ORIF RIGHT ANKLE  FX  2006  . POLYPECTOMY    . REMOVAL VOCAL CORD CYST  FEB 2014  . RIGHT BREAST BX  08-23-2012  . RIGHT HAND SURGERY  X3  LAST ONE 2009   INCLUDES  ORIF RIGHT 5TH FINGER AND REVISION TWICE  . TONSILLECTOMY AND ADENOIDECTOMY  AGE 71  . TOTAL ABDOMINAL HYSTERECTOMY W/ BILATERAL SALPINGOOPHORECTOMY  1982   W/  APPENDECTOMY  . TRANSTHORACIC ECHOCARDIOGRAM  06-24-2012   GRADE I DIASTOLIC DYSFUNCTION/  EF 55-60%/  MILD MR  . UPPER GASTROINTESTINAL ENDOSCOPY      Social History   Social History  . Marital status: Married    Spouse name: Jori Moll   . Number of children: 3  . Years of education: BA   Occupational History  . Retired Therapist, sports Retired   Social History Main Topics  . Smoking status: Former Smoker    Packs/day: 1.00    Years: 15.00    Types: Cigarettes    Quit date: 05/27/2005  . Smokeless tobacco: Never Used  . Alcohol use No  . Drug use: No  . Sexual activity: Yes    Partners: Male   Other Topics Concern  . Not on file   Social History Narrative   Lives with husband.   Caffeine use: 1/2 cup per day   Exercise-- 2days a week [employer],  Water aerobics, walking     Family History  Problem Relation Age of Onset  . Rectal cancer Mother   . Colon cancer Mother   . Breast cancer Mother   . Pancreatic cancer Mother   . Breast cancer Maternal Aunt        breast  . Irritable bowel syndrome Son   . Irritable bowel syndrome Daughter   . Heart disease Other   . Arthritis Other   . Diabetes Other        1st degree relative   . Hyperlipidemia Other   . Hypertension Other   . Osteoporosis Other   . Coronary artery disease Other 65  . Breast cancer Other        1st degree relative <50, lung  . Colon cancer Father   . Colon polyps Father   . Esophageal cancer Neg Hx   . Stomach cancer Neg Hx     ROS: Headaches and general malaise but no fevers or chills, productive  cough, hemoptysis, dysphasia, odynophagia, melena, hematochezia, dysuria, hematuria, rash, seizure activity, orthopnea, PND, pedal edema, claudication. Remaining systems are negative.  Physical Exam: Well-developed well-nourished in no acute distress.  Skin is warm and dry.  HEENT is normal.  Neck is supple.  Chest is clear to auscultation with normal expansion.  Cardiovascular exam is regular rate and rhythm.  Abdominal exam nontender or distended. No masses palpated. Extremities show no edema. neuro grossly intact  ECG- normal sinus rhythm, nonspecific ST changes. No change compared to 02/10/2015. personally reviewed  A/P  1 chest pain-no new symptoms. Last nuclear study showed no ischemia.  2 hypertension-blood pressure has been intermittently elevated by her report. Her medications were increased this past Monday by primary care with some improvement. We will follow and advance regimen as needed.  3 history of mild coronary disease-continue aspirin; intolerant to statins.  4 hyperlipidemia-history of intolerance to statins. Check lipids. If LDL elevated we'll consider referral to lipid clinic for zetia or repatha.  Kirk Ruths, MD

## 2017-10-25 ENCOUNTER — Encounter: Payer: Self-pay | Admitting: Gynecology

## 2017-10-26 ENCOUNTER — Encounter: Payer: Self-pay | Admitting: Cardiology

## 2017-10-26 ENCOUNTER — Ambulatory Visit (INDEPENDENT_AMBULATORY_CARE_PROVIDER_SITE_OTHER): Payer: Medicare Other | Admitting: Cardiology

## 2017-10-26 VITALS — BP 124/66 | HR 72 | Ht 64.0 in | Wt 125.6 lb

## 2017-10-26 DIAGNOSIS — I10 Essential (primary) hypertension: Secondary | ICD-10-CM

## 2017-10-26 DIAGNOSIS — E7800 Pure hypercholesterolemia, unspecified: Secondary | ICD-10-CM

## 2017-10-26 DIAGNOSIS — I2510 Atherosclerotic heart disease of native coronary artery without angina pectoris: Secondary | ICD-10-CM

## 2017-10-26 LAB — LIPID PANEL
Chol/HDL Ratio: 5 ratio — ABNORMAL HIGH (ref 0.0–4.4)
Cholesterol, Total: 253 mg/dL — ABNORMAL HIGH (ref 100–199)
HDL: 51 mg/dL (ref >39)
LDL CALC: 169 mg/dL — AB (ref 0–99)
Triglycerides: 165 mg/dL — ABNORMAL HIGH (ref 0–149)
VLDL CHOLESTEROL CAL: 33 mg/dL (ref 5–40)

## 2017-10-26 NOTE — Patient Instructions (Signed)
Medication Instructions:   NO CHANGE  Labwork:  Your physician recommends that you HAVE LAB WORK TODAY  Follow-Up:  Your physician wants you to follow-up in: ONE YEAR WITH DR CRENSHAW You will receive a reminder letter in the mail two months in advance. If you don't receive a letter, please call our office to schedule the follow-up appointment.   If you need a refill on your cardiac medications before your next appointment, please call your pharmacy.    

## 2017-10-29 DIAGNOSIS — N898 Other specified noninflammatory disorders of vagina: Secondary | ICD-10-CM | POA: Diagnosis not present

## 2017-10-29 DIAGNOSIS — K5900 Constipation, unspecified: Secondary | ICD-10-CM | POA: Diagnosis not present

## 2017-11-01 ENCOUNTER — Encounter: Payer: Self-pay | Admitting: Gynecology

## 2017-11-01 ENCOUNTER — Ambulatory Visit: Payer: Medicare Other | Admitting: Gynecology

## 2017-11-01 VITALS — BP 122/78 | Ht 64.5 in | Wt 124.0 lb

## 2017-11-01 DIAGNOSIS — N952 Postmenopausal atrophic vaginitis: Secondary | ICD-10-CM

## 2017-11-01 DIAGNOSIS — M810 Age-related osteoporosis without current pathological fracture: Secondary | ICD-10-CM | POA: Diagnosis not present

## 2017-11-01 DIAGNOSIS — Z01411 Encounter for gynecological examination (general) (routine) with abnormal findings: Secondary | ICD-10-CM

## 2017-11-01 DIAGNOSIS — Z1272 Encounter for screening for malignant neoplasm of vagina: Secondary | ICD-10-CM

## 2017-11-01 DIAGNOSIS — Z853 Personal history of malignant neoplasm of breast: Secondary | ICD-10-CM

## 2017-11-01 DIAGNOSIS — N898 Other specified noninflammatory disorders of vagina: Secondary | ICD-10-CM

## 2017-11-01 MED ORDER — ESTRADIOL 10 MCG VA TABS: 1.0000 | ORAL_TABLET | VAGINAL | 11 refills | Status: DC

## 2017-11-01 MED ORDER — DENOSUMAB 60 MG/ML ~~LOC~~ SOLN
60.0000 mg | Freq: Once | SUBCUTANEOUS | Status: AC
  Administered 2017-11-01: 60 mg via SUBCUTANEOUS

## 2017-11-01 NOTE — Addendum Note (Signed)
Addended by: Nelva Nay on: 11/01/2017 11:49 AM   Modules accepted: Orders

## 2017-11-01 NOTE — Addendum Note (Signed)
Addended by: Nelva Nay on: 11/01/2017 12:28 PM   Modules accepted: Orders

## 2017-11-01 NOTE — Progress Notes (Signed)
[name] 04/17/1946 [id number]        71 y.o.  G3P3 for breast and pelvic exam.  Former patient of Dr. Toney Rakes.  The patient has a complex history involving several areas.  Information was obtained through review of Dr. Sandrea Hughs notes and patient's history.  Issues discussed include:  1. Chronic vaginal discharge.  Treated repetitively for possible bacterial vaginosis without resolution of the discharge.  Notes a yellow to greenish chronic low-grade discharge.  A question of vaginal fistula was discussed and she underwent evaluation for this to include lower GI studies and from her historically a fistulogram which were all negative.  Her only having a discharge by her history.  No odor or significant irritation/itching. 2. History of TAH/BSO for endometriosis.  Subsequently she relates being treated for "cervical cancer" to include 5-FU.  She was unclear about radiation although on questioning it does not appear she was treated with radiation but with 5-FU.  Also remembers being told she was positive for HPV.  Pap smear 2017 and 2014 were both normal with negative HPV. 3. Vaginal dryness with dyspareunia.  Relates having been on estrogen vaginal supplements to include cream and Vagifem in the past.  No longer on this now.  Would like to reinitiate discussion about this.  Was on Premarin orally but discontinued after her breast cancer.  Initially had significant hot flushes and sweats but seems to be doing better now. 4. Osteoporosis.  DEXA 11/2016 T score -2.6.  Prior history of Actonel for 5 years and Reclast for 5 years with subsequent drug-free holiday for approaching 5 years last year.  Had discussion with Dr. Toney Rakes 01/2017 and subsequently was started on Prolia.  Past medical history,surgical history, problem list, medications, allergies, family history and social history were all reviewed and documented as reviewed in the EPIC chart.  ROS:  Performed with pertinent positives and  negatives included in the history, assessment and plan.   Additional significant findings : None   Exam: [name] assistant Vitals:   11/01/17 0956  BP: 122/78  Weight: 124 lb (56.2 kg)  Height: 5' 4.5" (1.638 m)   Body mass index is 20.96 kg/m.  General appearance:  Normal affect, orientation and appearance. Skin: Grossly normal HEENT: Without gross lesions.  No cervical or supraclavicular adenopathy. Thyroid normal.  Lungs:  Clear without wheezing, rales or rhonchi Cardiac: RR, without RMG Abdominal:  Soft, nontender, without masses, guarding, rebound, organomegaly or hernia Breasts:  Examined lying and sitting.  Left without masses, retractions, discharge or axillary adenopathy.  Right with well-healed lumpectomy scar otherwise no masses, retractions, discharge or adenopathy Pelvic:  Ext, BUS, Vagina: With atrophic changes.  Scant whitish discharge noted.  No gross evidence of fistula.  Adnexa: Without masses or tenderness    Anus and perineum: Normal   Rectovaginal: Normal sphincter tone without palpated masses or tenderness.    Assessment/Plan:  71 y.o. G3P3 female for some pelvic exam.   1. Postmenopausal/atrophic genital changes.  Having significant vaginal dryness and dyspareunia.  Had been on vaginal estrogen historically in the past.  History of receptor positive breast cancer.  I reviewed the whole issue of vaginal estrogen supplementation particularly in a breast cancer survivor and the risks of absorption with obtaining a significant blood level.  Risks of reactivation of breast cancer/de novo increased risk reviewed.  Other issues of estrogen to include thrombosis such as stroke heart attack DVT also discussed.  Patient understands there are no large studies to support  the use as well as discourage.  At this point the patient feels it is a quality of life issue and wants to go ahead and reinitiate vaginal estrogen.  As she has used the vaginal tablets before and is  comfortable with this we will start with Vagifem 10 mcg twice weekly.  She will call me if she has any issues. 2. 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  Scant discharge now.  The issue of how far to take a workup and what we would do with the results was discussed with her.  Risks of demonstrating a small fistula on studies and then not being able to find it sufficiently during surgery as well as the risks of surgery were reviewed.  At this point the patient does not want to carry the workup any further.  I reviewed various strategies to suppress recurrent vaginitis and ultimately I have recommended that she use a medicated vaginal douche every other to third week to cleanse the vagina and see if this does not prevent her symptoms.  If this is ineffective. 3. History suggesting VAIN having used 5-FU following her hysterectomy.  Also with history of positive HPV historically.  Her last 2 Pap smears were HPV negative.  I repeated her Pap smear today as I think a little closer surveillance is probably warranted given this questionable history and not having records.  Exam today shows atrophic changes but no evidence of abnormalities visually or on palpation. 4. Osteoporosis as outlined above.  Was initiated on Prolia by Dr. Toney Rakes.  Has done well with her shot.  We will continue for now and repeat her bone density next year at a 2-year interval.  Risks of treatment versus risks of fracture without treatment discussed.  No guarantees with treatment to prevent fractures. 5. History of right-sided breast cancer.  Exam NED.  Mammography coming due now and she will follow-up for this.  Had been on tamoxifen but discontinued it. 6. Colonoscopy 2017.  Repeat at their recommended interval. 7. Health maintenance.  No routine lab work done as patient does this elsewhere.  Follow-up for next Prolia shot.  Follow-up if any issues after initiating Vagifem.  But follow-up if  white vaginal discharge continues to be an issue.  Follow-up in 1 year for annual exam.  Additional time in excess of her breast and pelvic exam was spent in direct face to face counseling and coordination of care in regards to her vaginal dryness/dyspareunia, vaginal fistula/discharge and osteoporosis management.    Anastasio Auerbach MD, 10:34 AM 11/01/2017

## 2017-11-01 NOTE — Telephone Encounter (Signed)
Continue with Prolia when due for next shot this is note from Dr Phineas Real. [name] received Prolia today 11/01/17  Next injection due 05/02/18 .

## 2017-11-01 NOTE — Patient Instructions (Signed)
Use a medicated douche every 2-3 weeks to see if that does not help with the vaginal discharge.  Start on the vaginal estrogen tablets twice weekly to see if that does not help with the vaginal dryness, pain with intercourse and may even help the bladder.  Follow-up when due for your next Prolia shot.  Follow-up in 1 year for annual exam.

## 2017-11-05 LAB — PAP IG W/ RFLX HPV ASCU

## 2017-11-08 DIAGNOSIS — N3010 Interstitial cystitis (chronic) without hematuria: Secondary | ICD-10-CM | POA: Diagnosis not present

## 2017-11-08 DIAGNOSIS — R102 Pelvic and perineal pain: Secondary | ICD-10-CM | POA: Diagnosis not present

## 2017-11-08 DIAGNOSIS — N823 Fistula of vagina to large intestine: Secondary | ICD-10-CM | POA: Diagnosis not present

## 2017-11-18 ENCOUNTER — Other Ambulatory Visit: Payer: Self-pay | Admitting: Neurology

## 2017-11-26 ENCOUNTER — Ambulatory Visit (INDEPENDENT_AMBULATORY_CARE_PROVIDER_SITE_OTHER): Payer: Medicare Other | Admitting: Pharmacist Clinician (PhC)/ Clinical Pharmacy Specialist

## 2017-11-26 DIAGNOSIS — E7800 Pure hypercholesterolemia, unspecified: Secondary | ICD-10-CM | POA: Diagnosis not present

## 2017-11-26 NOTE — Patient Instructions (Signed)
Evolocumab injection What is this medicine? EVOLOCUMAB (e voe LOK ue mab) is known as a PCSK9 inhibitor. It is used to lower the level of cholesterol in the blood. It may be used alone or in combination with other cholesterol-lowering drugs. This drug may also be used to reduce the risk of heart attack, stroke, and certain types of heart surgery in patients with heart disease. This medicine may be used for other purposes; ask your health care provider or pharmacist if you have questions. COMMON BRAND NAME(S): REPATHA What should I tell my health care provider before I take this medicine? They need to know if you have any of these conditions: -an unusual or allergic reaction to evolocumab, other medicines, foods, dyes, or preservatives -pregnant or trying to get pregnant -breast-feeding How should I use this medicine? This medicine is for injection under the skin. You will be taught how to prepare and give this medicine. Use exactly as directed. Take your medicine at regular intervals. Do not take your medicine more often than directed. It is important that you put your used needles and syringes in a special sharps container. Do not put them in a trash can. If you do not have a sharps container, call your pharmacist or health care provider to get one. Talk to your pediatrician regarding the use of this medicine in children. While this drug may be prescribed for children as young as 13 years for selected conditions, precautions do apply. Overdosage: If you think you have taken too much of this medicine contact a poison control center or emergency room at once. NOTE: This medicine is only for you. Do not share this medicine with others. What if I miss a dose? If you miss a dose, take it as soon as you can if there are more than 7 days until the next scheduled dose, or skip the missed dose and take the next dose according to your original schedule. Do not take double or extra doses. What may interact  with this medicine? Interactions are not expected. This list may not describe all possible interactions. Give your health care provider a list of all the medicines, herbs, non-prescription drugs, or dietary supplements you use. Also tell them if you smoke, drink alcohol, or use illegal drugs. Some items may interact with your medicine. What should I watch for while using this medicine? You may need blood work while you are taking this medicine. What side effects may I notice from receiving this medicine? Side effects that you should report to your doctor or health care professional as soon as possible: -allergic reactions like skin rash, itching or hives, swelling of the face, lips, or tongue -signs and symptoms of infection like fever or chills; cough; sore throat; pain or trouble passing urine Side effects that usually do not require medical attention (report to your doctor or health care professional if they continue or are bothersome): -diarrhea -nausea -muscle pain -pain, redness, or irritation at site where injected This list may not describe all possible side effects. Call your doctor for medical advice about side effects. You may report side effects to FDA at 1-800-FDA-1088. Where should I keep my medicine? Keep out of the reach of children. You will be instructed on how to store this medicine. Throw away any unused medicine after the expiration date on the label. NOTE: This sheet is a summary. It may not cover all possible information. If you have questions about this medicine, talk to your doctor, pharmacist, or health care   provider.  2018 Elsevier/Gold Standard (2016-11-27 13:21:53)  

## 2017-11-26 NOTE — Progress Notes (Signed)
11/27/2017 [name] [birth year] [id number]   HPI:  [name] is a 71 y.o. female patient of Dr Stanford Breed, who presents today for a lipid clinic evaluation.  Her medical history is significant for CAD, hypertension and hyperlipidemia.   She also has fibromyalgia as well as a history of breast cancer.     Current Medications:  None  Cholesterol Goals:   LDL < 70  Intolerant/previously tried:  Atorvastatin 20 mg - fatigue - severe joints and muscles pains  Rosuvastatin 10 mg - same as above  Family history:   Father - 2 MI, first in his 110's, also with CHF - died at 3, was in need of transplant, but had too many other   issues, died from stroke post broken hip  Mother - hypertension, DM, hyperlipidemia - died at 32  1 sister with no health issues  3 children - 1 son with MV replacement at 59   Diet:   Mostly home cooked, plenty of salads, prefers vegetables to meats; beans; avoids gluten when able, but does not purposely avoid; no sweets, no chips (husband likes chips, sweets)  Exercise:    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 acetaminophen (TYLENOL) 500 MG tablet Take 1,000 mg by mouth every 6 (six) hours as needed for mild pain or headache.     . albuterol (PROVENTIL HFA;VENTOLIN HFA) 108 (90 BASE) MCG/ACT inhaler Inhale 2 puffs into the lungs every 4 (four) hours as needed. 1 Inhaler 5  . aspirin EC 81 MG tablet Take 1 tablet (81 mg total) by mouth daily. 90 tablet 3  . atenolol (TENORMIN) 25 MG tablet Take 25 mg by mouth daily.    . DULoxetine (CYMBALTA) 60 MG capsule TAKE 1 BY MOUTH TWICE DAILY 180 capsule 1  . Estradiol 10 MCG TABS vaginal tablet Place 1 tablet (10 mcg total) 2 (two) times a week vaginally. 8 tablet 11  . fluticasone (FLONASE) 50  MCG/ACT nasal spray Place 1 spray into the nose as needed for allergies.     . furosemide (LASIX) 20 MG tablet Take 1 tablet (20 mg total) by mouth every morning. 180 tablet 1  . gabapentin (NEURONTIN) 300 MG capsule Take 1 capsule (300 mg total) by mouth 2 (two) times daily. 60 capsule 6  . loratadine (CLARITIN) 10 MG tablet Take 1 tablet (10 mg total) by mouth daily. 30 tablet 11  . losartan (COZAAR) 25 MG tablet Take 25 mg by mouth daily.     Marland Kitchen omeprazole (PRILOSEC) 40 MG capsule Take 40 mg by mouth 2 (two) times daily.    . pentosan polysulfate (ELMIRON) 100 MG capsule Take 1 capsule (100 mg total) by mouth 3 (three) times daily. Reported on 01/05/2016 (Patient taking differently: Take 100 mg by mouth 3 (three) times daily with meals as needed. Reported on 01/05/2016) 90 capsule 11  . traMADol (ULTRAM) 50 MG tablet Take 1-2 tablets (50-100 mg total) by mouth 3 (three) times daily as needed for moderate pain. (Patient taking differently: Take 50-100 mg by mouth 3 (three) times daily as needed for moderate pain (pt uses TID). ) 180 tablet 0  . traZODone (DESYREL) 100 MG tablet Take 1 tablet (100 mg total) by mouth at bedtime. 180 tablet 1  .  Vitamin D, Ergocalciferol, (DRISDOL) 50000 units CAPS capsule TAKE ONE CAPSULE BY MOUTH ONE TIME PER WEEK 10 capsule 0   Current Facility-Administered Medications  Medication Dose Route Frequency Provider Last Rate Last Dose  . 0.9 %  sodium chloride infusion  500 mL Intravenous Continuous Armbruster, Carlota Raspberry, MD      . 0.9 %  sodium chloride infusion  500 mL Intravenous Continuous Armbruster, Carlota Raspberry, MD       Facility-Administered Medications Ordered in Other Visits  Medication Dose Route Frequency Provider Last Rate Last Dose  . bupivacaine (MARCAINE) 0.5 % 10 mL, triamcinolone acetonide (KENALOG-40) 40 mg injection   Subcutaneous Once Carolan Clines, MD      . bupivacaine (MARCAINE) 0.5 % 15 mL, phenazopyridine (PYRIDIUM) 400 mg bladder mixture    Bladder Instillation Once Carolan Clines, MD        Allergies  Allergen Reactions  . Clindamycin Hcl Shortness Of Breath and Rash  . Penicillins Anaphylaxis  . Prednisone Shortness Of Breath and Rash  . Codeine Hives and Other (See Comments)    headache  . Fluzone [Flu Virus Vaccine] Other (See Comments)    Local reaction at the site  . Latex Hives  . Pentazocine Lactate Other (See Comments)    HALLUCINATION  . Pneumococcal Vaccine Polyvalent Hives, Swelling and Other (See Comments)    REACTION: redness, swelling, and hives at injection site  . Tamoxifen Nausea And Vomiting and Other (See Comments)    HEADACHE    Past Medical History:  Diagnosis Date  . Allergy   . Anemia   . Anxiety    past hx   . Arthritis   . Asthma   . Atrial flutter ([place])    past history- not current  . CAD (coronary artery disease) CARDIOLOGIST--  DR Angelena Form   mild non-obstructive cad  . Cancer ([place])    right  . Cataract    bilaterally removed   . Chronic constipation   . Chronic kidney disease    interstitial cystitis  . COPD (chronic obstructive pulmonary disease) ([place])   . Depression    past hx   . Family history of malignant hyperthermia    father had this  . Fibromyalgia   . Frequency of urination   . GERD (gastroesophageal reflux disease)   . H/O hiatal hernia   . History of basal cell carcinoma excision    X2  . History of breast cancer ONCOLOGIST-- DR Jana Hakim---  NO RECURRANCE   DX 07/2012;  LOW GRADE DCIS  ER+PR+  ----  S/P RIGHT LUMPECTOMY WITH NEGATIVE MARGINS/   RADIATION ENDED 11/2012  . History of chronic bronchitis   . History of colonic polyps   . History of tachycardia    CONTROLLED  WITH ATENOLOL  . Hyperlipidemia   . Hypertension   . Neuromuscular disorder ([facility])    fibromyalgia  . Osteoporosis   . Pelvic pain   . S/P radiation therapy 11/12/12 - 12/05/12   right Breast  . Sepsis ([place]) 2014   from UTI   . Sinus headache   . Urgency of urination      There were no vitals taken for this visit.   Hyperlipidemia Patient with hyperlipidemia, LDL at 169, unable to tolerate two different statins due to myalgias.   Will apply to her insurance company for [employer].  Had a long discussion on lifestyle changes and patient was encouraged to continue with exercise as tolerated (due to fibromyalgia) and healthy eating  habits.  Once approved for Repatha we will bring patient in to teach her injection technique.   Tommy Medal PharmD CPP [facility]

## 2017-11-27 ENCOUNTER — Encounter: Payer: Self-pay | Admitting: Pharmacist Clinician (PhC)/ Clinical Pharmacy Specialist

## 2017-11-27 DIAGNOSIS — Z1231 Encounter for screening mammogram for malignant neoplasm of breast: Secondary | ICD-10-CM | POA: Diagnosis not present

## 2017-11-27 DIAGNOSIS — Z0000 Encounter for general adult medical examination without abnormal findings: Secondary | ICD-10-CM | POA: Diagnosis not present

## 2017-11-27 NOTE — Assessment & Plan Note (Signed)
Patient with hyperlipidemia, LDL at 169, unable to tolerate two different statins due to myalgias.   Will apply to her insurance company for [employer].  Had a long discussion on lifestyle changes and patient was encouraged to continue with exercise as tolerated (due to fibromyalgia) and healthy eating habits.  Once approved for Repatha we will bring patient in to teach her injection technique.

## 2017-11-28 ENCOUNTER — Other Ambulatory Visit: Payer: Self-pay | Admitting: Pharmacist Clinician (PhC)/ Clinical Pharmacy Specialist

## 2017-11-28 DIAGNOSIS — Z6821 Body mass index (BMI) 21.0-21.9, adult: Secondary | ICD-10-CM | POA: Diagnosis not present

## 2017-11-28 DIAGNOSIS — R768 Other specified abnormal immunological findings in serum: Secondary | ICD-10-CM | POA: Diagnosis not present

## 2017-11-28 DIAGNOSIS — M3500 Sicca syndrome, unspecified: Secondary | ICD-10-CM | POA: Diagnosis not present

## 2017-11-28 DIAGNOSIS — M2550 Pain in unspecified joint: Secondary | ICD-10-CM | POA: Diagnosis not present

## 2017-11-28 MED ORDER — EVOLOCUMAB 140 MG/ML ~~LOC~~ SOAJ: 140.0000 mg | SUBCUTANEOUS | 12 refills | Status: DC

## 2017-12-11 DIAGNOSIS — H9201 Otalgia, right ear: Secondary | ICD-10-CM | POA: Diagnosis not present

## 2017-12-11 DIAGNOSIS — G44229 Chronic tension-type headache, not intractable: Secondary | ICD-10-CM | POA: Diagnosis not present

## 2017-12-11 DIAGNOSIS — I10 Essential (primary) hypertension: Secondary | ICD-10-CM | POA: Diagnosis not present

## 2017-12-13 ENCOUNTER — Telehealth: Payer: Self-pay | Admitting: Cardiology

## 2017-12-13 DIAGNOSIS — I10 Essential (primary) hypertension: Secondary | ICD-10-CM

## 2017-12-13 DIAGNOSIS — I776 Arteritis, unspecified: Secondary | ICD-10-CM

## 2017-12-13 MED ORDER — LOSARTAN POTASSIUM 100 MG PO TABS: 100.0000 mg | ORAL_TABLET | Freq: Every day | ORAL | 3 refills | Status: DC

## 2017-12-13 NOTE — Telephone Encounter (Signed)
New Message   Patient is requesting a call about her BP. She states that it has been running on average 160/110. Periodic dizziness and SOB.  Please call.  **smart phrases not working**

## 2017-12-13 NOTE — Telephone Encounter (Signed)
Spoke with pt, Aware of dr crenshaw's recommendations. New script sent to the pharmacy and Lab orders mailed to the pt  

## 2017-12-13 NOTE — Telephone Encounter (Signed)
Returned the call to the patient. She stated that since October her blood pressure has been increasing. She normally stays around the 120/80's. She recently saw her PCP and her blood pressure was 150/100. The PA increased her Losartan to 50 mg daily. She has been on Losartan 50 mg for  for three days. She takes her Losartan and Atenolol in the mornings and checks her blood pressure mid afternoon. Today her blood pressure was 152/106 and heart rate was 106. She did state that her heart rate stays in the 90-100 range.  She has complaints of headaches and periodic dizziness. She states that that she thinks the dizziness is from getting up too fast from a sitting position. Will route to the provider for his recommendation.

## 2017-12-13 NOTE — Telephone Encounter (Signed)
F/U Call: Patient would like to add that she has constant headaches. Patient specifically mentions "the headaches never go away."

## 2017-12-13 NOTE — Telephone Encounter (Signed)
Change cozaar to 100 mg daily and follow BP; Bmet one week Kirk Ruths

## 2017-12-15 ENCOUNTER — Telehealth: Payer: Self-pay | Admitting: Physician Assistant

## 2017-12-15 ENCOUNTER — Other Ambulatory Visit: Payer: Self-pay | Admitting: Physician Assistant

## 2017-12-15 DIAGNOSIS — I10 Essential (primary) hypertension: Secondary | ICD-10-CM

## 2017-12-15 MED ORDER — ATENOLOL 25 MG PO TABS: 25.0000 mg | ORAL_TABLET | Freq: Three times a day (TID) | ORAL | 11 refills | Status: DC

## 2017-12-15 MED ORDER — LOSARTAN POTASSIUM 100 MG PO TABS: 100.0000 mg | ORAL_TABLET | Freq: Every day | ORAL | 3 refills | Status: DC

## 2017-12-15 NOTE — Telephone Encounter (Signed)
Patient called because her blood pressure has been running very high.  Diastolic is frequently over 037 and systolic is 096-438.  She is worried because they are supposed to go out of town and her husband is afraid she will have a stroke  Review medications with her, she is compliant.  The worst problem with her blood pressure seems to be in the afternoon and evening.  Her heart rate is always high, sometimes over 100.  She is not having palpitations, is not lightheaded or dizzy.  I will increase the atenolol to 25 mg 3 times daily with meals.  If her blood pressure will be under slightly better control, I do not feel she is in danger of having stroke.  I advised her that it was unusual to have a heart rate of 100 when she is doing little or nothing.  She is to keep an eye on this.  Office to contact her on Monday and make sure she is doing okay.  Rosaria Ferries, PA-C 12/15/2017 2:41 PM Beeper [phone]

## 2017-12-19 ENCOUNTER — Other Ambulatory Visit: Payer: Self-pay | Admitting: Oncology

## 2017-12-19 ENCOUNTER — Telehealth: Payer: Self-pay | Admitting: Cardiology

## 2017-12-19 DIAGNOSIS — Z9889 Other specified postprocedural states: Secondary | ICD-10-CM

## 2017-12-19 MED ORDER — AMLODIPINE BESYLATE 5 MG PO TABS: 5.0000 mg | ORAL_TABLET | Freq: Every day | ORAL | 3 refills | Status: DC

## 2017-12-19 NOTE — Telephone Encounter (Signed)
Spoke with [name], Aware of dr crenshaw's recommendations. New script sent to the pharmacy  

## 2017-12-19 NOTE — Telephone Encounter (Signed)
Add amlodipine 5 mg daily and follow bp Kirk Ruths

## 2017-12-19 NOTE — Telephone Encounter (Signed)
Spoke with pt, her bp this morning of 155/104 was prior to taking her medications. She is currently traveling and checked it before leaving and it was still 155/95. Her losartan is 100 mg since 12/20 and her atenolol was increased by rhonda barrett pa 12-15-17 and the only change that has made is her heart rate is now lower at 83 bpm, it is no longer racing. There has been no change in her headaches. She does not feel that anxiety or stress is cousing this issue and there have been no diet changes. Offered an appointment for her to see the pharmacist in the hypertension clinic but she is not really interested. Will forward for dr Stanford Breed review

## 2017-12-19 NOTE — Telephone Encounter (Signed)
Pt c/o BP issue: STAT if pt c/o blurred vision, one-sided weakness or slurred speech  1. What are your last 5 BP readings?155/104 this morning.  2. Are you having any other symptoms (ex. Dizziness, headache, blurred vision, passed out)?   3. What is your BP issue? BP still running high   Patient called previously and spoke with Barrett about the matter and was told that someone would f/u.

## 2017-12-20 ENCOUNTER — Ambulatory Visit
Admission: RE | Admit: 2017-12-20 | Discharge: 2017-12-20 | Disposition: A | Discharge status: Home / Self Care | Payer: Medicare Other | Source: Ambulatory Visit | Attending: Oncology | Admitting: Oncology

## 2017-12-20 DIAGNOSIS — Z9889 Other specified postprocedural states: Secondary | ICD-10-CM

## 2017-12-20 DIAGNOSIS — I10 Essential (primary) hypertension: Secondary | ICD-10-CM | POA: Diagnosis not present

## 2017-12-20 DIAGNOSIS — R922 Inconclusive mammogram: Secondary | ICD-10-CM | POA: Diagnosis not present

## 2017-12-20 HISTORY — DX: Personal history of irradiation: Z92.3

## 2017-12-21 LAB — BASIC METABOLIC PANEL
BUN/Creatinine Ratio: 11 — ABNORMAL LOW (ref 12–28)
BUN: 12 mg/dL (ref 8–27)
CHLORIDE: 99 mmol/L (ref 96–106)
CO2: 26 mmol/L (ref 20–29)
Calcium: 8.9 mg/dL (ref 8.7–10.3)
Creatinine, Ser: 1.06 mg/dL — ABNORMAL HIGH (ref 0.57–1.00)
GFR calc non Af Amer: 53 mL/min/{1.73_m2} — ABNORMAL LOW (ref >59)
GFR, EST AFRICAN AMERICAN: 61 mL/min/{1.73_m2} (ref >59)
GLUCOSE: 79 mg/dL (ref 65–99)
Potassium: 3.7 mmol/L (ref 3.5–5.2)
SODIUM: 139 mmol/L (ref 134–144)

## 2017-12-24 ENCOUNTER — Encounter: Payer: Self-pay | Admitting: Cardiology

## 2017-12-24 ENCOUNTER — Ambulatory Visit: Payer: Medicare Other | Admitting: Cardiology

## 2017-12-24 ENCOUNTER — Telehealth: Payer: Self-pay | Admitting: Cardiology

## 2017-12-24 DIAGNOSIS — E782 Mixed hyperlipidemia: Secondary | ICD-10-CM | POA: Diagnosis not present

## 2017-12-24 DIAGNOSIS — I2510 Atherosclerotic heart disease of native coronary artery without angina pectoris: Secondary | ICD-10-CM | POA: Diagnosis not present

## 2017-12-24 DIAGNOSIS — I10 Essential (primary) hypertension: Secondary | ICD-10-CM

## 2017-12-24 NOTE — Telephone Encounter (Signed)
Spoke with pt, she continues to have problems with elevated bp. She was told by rhonda barrett pa she needed to be seen this week if no change. Follow up scheduled today with luke kilroy pa.

## 2017-12-24 NOTE — Assessment & Plan Note (Signed)
Labile B/P- medications increased three times this month

## 2017-12-24 NOTE — Telephone Encounter (Signed)
New Message  Pt call requesting to speak with RN. Pt states she spoke with RN on last week about her bp being high. Pt states it has not went down and would like to speak with RN before scheduling appt.

## 2017-12-24 NOTE — Patient Instructions (Addendum)
Medication Instructions:  Your physician recommends that you continue on your current medications as directed. Please refer to the Current Medication list given to you today.  Labwork: None   Testing/Procedures: Your physician has requested that you have a renal artery duplex. During this test, an ultrasound is used to evaluate blood flow to the kidneys. Allow one hour for this exam. Do not eat after midnight the day before and avoid carbonated beverages. Take your medications as you usually do.  Follow-Up: Your physician recommends that you schedule a follow-up appointment in: Dr Stanford Breed in February 2019  Any Other Special Instructions Will Be Listed Below (If Applicable).  If you need a refill on your cardiac medications before your next appointment, please call your pharmacy.

## 2017-12-24 NOTE — Telephone Encounter (Signed)
Returned call to patient no answer.LMTC. 

## 2017-12-24 NOTE — Progress Notes (Signed)
12/24/2017 [name]   1946/04/20  [id number]  Primary Physician Ryter-Brown, Shyrl Numbers, MD Primary Cardiologist: Dr Stanford Breed  HPI:  71 y/o female with a history of minor CAD in 2009 and 2011. Myoview 2013 and 2016 low risk. EF by echo 2013 55-60% with grade 1 DD and PA pressure of 31 mmHg. She has labile HTN. Her Cozaar was increased to 100 mg daily on 12/20, tenormin increased to 25 mg TID on 12/22, and Norvasc added 12/26.  She is in the office today for follow up. Her B/P in the office is good- 143/76. She brought in a log of her home B/P and they run higher. She denies any unusual anxiety. She concerned and wanted to know why her B/P has been running high.  I suggested she stay the course for now. I explained that she has three increases this month and that the Amlodipine may actually take a week or two to have full effect. She remains concerned about possible causes and I suggested we obtain a renal artery doppler.    Current Outpatient Medications  Medication Sig Dispense Refill  . acetaminophen (TYLENOL) 500 MG tablet Take 1,000 mg by mouth every 6 (six) hours as needed for mild pain or headache.     . albuterol (PROVENTIL HFA;VENTOLIN HFA) 108 (90 BASE) MCG/ACT inhaler Inhale 2 puffs into the lungs every 4 (four) hours as needed. 1 Inhaler 5  . amLODipine (NORVASC) 5 MG tablet Take 1 tablet (5 mg total) by mouth daily. 90 tablet 3  . aspirin EC 81 MG tablet Take 1 tablet (81 mg total) by mouth daily. 90 tablet 3  . atenolol (TENORMIN) 25 MG tablet Take 1 tablet (25 mg total) by mouth 3 (three) times daily before meals. 90 tablet 11  . DULoxetine (CYMBALTA) 60 MG capsule TAKE 1 BY MOUTH TWICE DAILY 180 capsule 1  . Estradiol 10 MCG TABS vaginal tablet Place 1 tablet (10 mcg total) 2 (two) times a week vaginally. 8 tablet 11  . Evolocumab (REPATHA SURECLICK) 528 MG/ML SOAJ Inject 140 mg into the skin every 14 (fourteen) days. 2 pen 12  . fluticasone (FLONASE) 50 MCG/ACT nasal spray  Place 1 spray into the nose as needed for allergies.     . furosemide (LASIX) 20 MG tablet Take 1 tablet (20 mg total) by mouth every morning. 180 tablet 1  . gabapentin (NEURONTIN) 300 MG capsule Take 1 capsule (300 mg total) by mouth 2 (two) times daily. 60 capsule 6  . loratadine (CLARITIN) 10 MG tablet Take 1 tablet (10 mg total) by mouth daily. 30 tablet 11  . losartan (COZAAR) 100 MG tablet Take 1 tablet (100 mg total) by mouth daily. 90 tablet 3  . omeprazole (PRILOSEC) 40 MG capsule Take 40 mg by mouth 2 (two) times daily.    . pentosan polysulfate (ELMIRON) 100 MG capsule Take 1 capsule (100 mg total) by mouth 3 (three) times daily. Reported on 01/05/2016 (Patient taking differently: Take 100 mg by mouth 3 (three) times daily with meals as needed. Reported on 01/05/2016) 90 capsule 11  . traMADol (ULTRAM) 50 MG tablet Take 1-2 tablets (50-100 mg total) by mouth 3 (three) times daily as needed for moderate pain. (Patient taking differently: Take 50-100 mg by mouth 3 (three) times daily as needed for moderate pain (pt uses TID). ) 180 tablet 0  . traZODone (DESYREL) 100 MG tablet Take 1 tablet (100 mg total) by mouth at bedtime. 180 tablet 1  .  Vitamin D, Ergocalciferol, (DRISDOL) 50000 units CAPS capsule TAKE ONE CAPSULE BY MOUTH ONE TIME PER WEEK 10 capsule 0   Current Facility-Administered Medications  Medication Dose Route Frequency Provider Last Rate Last Dose  . 0.9 %  sodium chloride infusion  500 mL Intravenous Continuous Armbruster, Carlota Raspberry, MD      . 0.9 %  sodium chloride infusion  500 mL Intravenous Continuous Armbruster, Carlota Raspberry, MD       Facility-Administered Medications Ordered in Other Visits  Medication Dose Route Frequency Provider Last Rate Last Dose  . bupivacaine (MARCAINE) 0.5 % 10 mL, triamcinolone acetonide (KENALOG-40) 40 mg injection   Subcutaneous Once Carolan Clines, MD      . bupivacaine (MARCAINE) 0.5 % 15 mL, phenazopyridine (PYRIDIUM) 400 mg bladder  mixture   Bladder Instillation Once Carolan Clines, MD        Allergies  Allergen Reactions  . Clindamycin Hcl Shortness Of Breath and Rash  . Penicillins Anaphylaxis  . Prednisone Shortness Of Breath and Rash  . Codeine Hives and Other (See Comments)    headache  . Fluzone [Flu Virus Vaccine] Other (See Comments)    Local reaction at the site  . Latex Hives  . Pentazocine Lactate Other (See Comments)    HALLUCINATION  . Pneumococcal Vaccine Polyvalent Hives, Swelling and Other (See Comments)    REACTION: redness, swelling, and hives at injection site  . Tamoxifen Nausea And Vomiting and Other (See Comments)    HEADACHE    Past Medical History:  Diagnosis Date  . Allergy   . Anemia   . Anxiety    past hx   . Arthritis   . Asthma   . Atrial flutter ([place])    past history- not current  . CAD (coronary artery disease) CARDIOLOGIST--  DR Angelena Form   mild non-obstructive cad  . Cancer ([place])    right  . Cataract    bilaterally removed   . Chronic constipation   . Chronic kidney disease    interstitial cystitis  . COPD (chronic obstructive pulmonary disease) ([place])   . Depression    past hx   . Family history of malignant hyperthermia    father had this  . Fibromyalgia   . Frequency of urination   . GERD (gastroesophageal reflux disease)   . H/O hiatal hernia   . History of basal cell carcinoma excision    X2  . History of breast cancer ONCOLOGIST-- DR Jana Hakim---  NO RECURRANCE   DX 07/2012;  LOW GRADE DCIS  ER+PR+  ----  S/P RIGHT LUMPECTOMY WITH NEGATIVE MARGINS/   RADIATION ENDED 11/2012  . History of chronic bronchitis   . History of colonic polyps   . History of tachycardia    CONTROLLED  WITH ATENOLOL  . Hyperlipidemia   . Hypertension   . Neuromuscular disorder (HCC)    fibromyalgia  . Osteoporosis   . Pelvic pain   . Personal history of radiation therapy   . S/P radiation therapy 11/12/12 - 12/05/12   right Breast  . Sepsis ([place]) 2014   from UTI     . Sinus headache   . Urgency of urination     Social History   Socioeconomic History  . Marital status: Married    Spouse name: Jori Moll   . Number of children: 3  . Years of education: BA  . Highest education level: Not on file  Social Needs  . Financial resource strain: Not on file  . Food  insecurity - worry: Not on file  . Food insecurity - inability: Not on file  . Transportation needs - medical: Not on file  . Transportation needs - non-medical: Not on file  Occupational History  . Occupation: Retired Programmer, multimedia: RETIRED  Tobacco Use  . Smoking status: Former Smoker    Packs/day: 1.00    Years: 15.00    Pack years: 15.00    Types: Cigarettes    Last attempt to quit: 05/27/2005    Years since quitting: 12.5  . Smokeless tobacco: Never Used  Substance and Sexual Activity  . Alcohol use: No    Alcohol/week: 0.0 oz  . Drug use: No  . Sexual activity: Yes    Partners: Male    Comment: 1st intercourse 71 yo-Fewer than 5 partners  Other Topics Concern  . Not on file  Social History Narrative   Lives with husband.   Caffeine use: 1/2 cup per day   Exercise-- 2days a week [employer],  Water aerobics, walking      Family History  Problem Relation Age of Onset  . Rectal cancer Mother   . Colon cancer Mother   . Breast cancer Mother   . Pancreatic cancer Mother   . Breast cancer Maternal Aunt        breast  . Irritable bowel syndrome Son   . Irritable bowel syndrome Daughter   . Colon cancer Father   . Colon polyps Father   . Breast cancer Paternal Aunt   . Breast cancer Paternal Aunt   . Esophageal cancer Neg Hx   . Stomach cancer Neg Hx      Review of Systems: General: negative for chills, fever, night sweats or weight changes.  Cardiovascular: negative for chest pain, dyspnea on exertion, edema, orthopnea, palpitations, paroxysmal nocturnal dyspnea or shortness of breath Dermatological: negative for rash Respiratory: negative for cough or  wheezing Urologic: negative for hematuria Abdominal: negative for nausea, vomiting, diarrhea, bright red blood per rectum, melena, or hematemesis Neurologic: negative for visual changes, syncope, or dizziness All other systems reviewed and are otherwise negative except as noted above.    Blood pressure 134/76, pulse 79, height 5\' 4"  (1.626 m), weight 124 lb 12.8 oz (56.6 kg).  General appearance: alert, cooperative and no distress Neck: no JVD Extremities: extremities normal, atraumatic, no cyanosis or edema Skin: Skin color, texture, turgor normal. No rashes or lesions Neurologic: Grossly normal   ASSESSMENT AND PLAN:   Essential hypertension Labile B/P- medications increased three times this month  CAD in native artery Minor CAD 2009, 2011, low risk Myoview 2013 and 2016  Hyperlipidemia Statin intolerant   PLAN  Renal artery doppler. F/U Dr Stanford Breed (she requested Dr Stanford Breed see her).   Kerin Ransom PA-C 12/24/2017 3:49 PM

## 2017-12-24 NOTE — Assessment & Plan Note (Signed)
Statin intolerant 

## 2017-12-24 NOTE — Assessment & Plan Note (Signed)
Minor CAD 2009, 2011, low risk Myoview 2013 and 2016

## 2017-12-26 DIAGNOSIS — I10 Essential (primary) hypertension: Secondary | ICD-10-CM | POA: Diagnosis not present

## 2017-12-27 ENCOUNTER — Encounter (HOSPITAL_COMMUNITY): Payer: Medicare Other

## 2017-12-31 ENCOUNTER — Other Ambulatory Visit: Payer: Self-pay | Admitting: Cardiology

## 2017-12-31 DIAGNOSIS — I10 Essential (primary) hypertension: Secondary | ICD-10-CM

## 2018-01-02 ENCOUNTER — Ambulatory Visit (HOSPITAL_COMMUNITY)
Admission: RE | Admit: 2018-01-02 | Discharge: 2018-01-02 | Disposition: A | Discharge status: Home / Self Care | Payer: Medicare Other | Source: Ambulatory Visit | Attending: Cardiovascular Disease | Admitting: Cardiovascular Disease

## 2018-01-02 DIAGNOSIS — N271 Small kidney, bilateral: Secondary | ICD-10-CM | POA: Diagnosis not present

## 2018-01-02 DIAGNOSIS — I10 Essential (primary) hypertension: Secondary | ICD-10-CM | POA: Diagnosis not present

## 2018-01-03 ENCOUNTER — Telehealth: Payer: Self-pay | Admitting: Cardiology

## 2018-01-03 NOTE — Telephone Encounter (Signed)
-----   Message from [place], [place] sent at 01/03/2018  1:18 PM EST ----- Renal ultrasound shows that the arteries supplying blood to both kidneys are normal w/o any signs of blockages. Not the cause of her high BP.

## 2018-01-03 NOTE — Telephone Encounter (Signed)
New message  Returning call about lab results

## 2018-01-03 NOTE — Telephone Encounter (Signed)
Returned pts call and she has been made aware of her Renal US. See result note.

## 2018-01-04 ENCOUNTER — Other Ambulatory Visit: Payer: Self-pay | Admitting: Pharmacist Clinician (PhC)/ Clinical Pharmacy Specialist

## 2018-01-04 MED ORDER — EVOLOCUMAB 140 MG/ML ~~LOC~~ SOAJ: 140.0000 mg | SUBCUTANEOUS | 12 refills | Status: DC

## 2018-01-08 DIAGNOSIS — J4520 Mild intermittent asthma, uncomplicated: Secondary | ICD-10-CM | POA: Diagnosis not present

## 2018-01-08 DIAGNOSIS — J0110 Acute frontal sinusitis, unspecified: Secondary | ICD-10-CM | POA: Diagnosis not present

## 2018-01-08 DIAGNOSIS — I10 Essential (primary) hypertension: Secondary | ICD-10-CM | POA: Diagnosis not present

## 2018-01-08 DIAGNOSIS — R002 Palpitations: Secondary | ICD-10-CM | POA: Diagnosis not present

## 2018-01-17 NOTE — Progress Notes (Signed)
HPI: FU CAD. Cardiac catheterization in August 2011 showed a 40% proximal LAD, 30% mid LAD, 30% RCA and her ejection fraction was 60%. Nuclear study in June 2013 showed no ischemia with an ejection fraction of 68%. Echocardiogram July 2013 showed normal LV function, grade 1 diastolic dysfunction, mild mitral regurgitation, trace aortic insufficiency and mildly elevated pulmonary pressures. Nuclear study March 2016 showed ejection fraction 67%. Breast attenuation but no ischemia. MRA of the head October 2018 normal. Renal dopplers 1/19 showed no RAS. Since last seen  her son had a heart attack and sounds to be critically ill.  She asked a number of questions about this.  She denies chest pain, palpitations or syncope.  Mild dyspnea on exertion.  Current Outpatient Medications  Medication Sig Dispense Refill  . acetaminophen (TYLENOL) 500 MG tablet Take 1,000 mg by mouth every 6 (six) hours as needed for mild pain or headache.     . albuterol (PROVENTIL HFA;VENTOLIN HFA) 108 (90 BASE) MCG/ACT inhaler Inhale 2 puffs into the lungs every 4 (four) hours as needed. 1 Inhaler 5  . amLODipine (NORVASC) 5 MG tablet Take 1 tablet (5 mg total) by mouth daily. (Patient taking differently: Take 5 mg by mouth every evening. ) 90 tablet 3  . aspirin EC 81 MG tablet Take 1 tablet (81 mg total) by mouth daily. 90 tablet 3  . atenolol (TENORMIN) 25 MG tablet Take 1 tablet (25 mg total) by mouth 3 (three) times daily before meals. (Patient taking differently: Take 25 mg by mouth 2 (two) times daily. ) 90 tablet 11  . DULoxetine (CYMBALTA) 60 MG capsule TAKE 1 BY MOUTH TWICE DAILY 180 capsule 1  . Estradiol 10 MCG TABS vaginal tablet Place 1 tablet (10 mcg total) 2 (two) times a week vaginally. 8 tablet 11  . Evolocumab (REPATHA SURECLICK) 960 MG/ML SOAJ Inject 140 mg into the skin every 14 (fourteen) days. 2 pen 12  . fluticasone (FLONASE) 50 MCG/ACT nasal spray Place 1 spray into the nose as needed for  allergies.     . furosemide (LASIX) 20 MG tablet Take 1 tablet (20 mg total) by mouth every morning. 180 tablet 1  . gabapentin (NEURONTIN) 300 MG capsule Take 1 capsule (300 mg total) by mouth 2 (two) times daily. 60 capsule 6  . loratadine (CLARITIN) 10 MG tablet Take 1 tablet (10 mg total) by mouth daily. 30 tablet 11  . losartan (COZAAR) 100 MG tablet Take 1 tablet (100 mg total) by mouth daily. 90 tablet 3  . omeprazole (PRILOSEC) 40 MG capsule Take 40 mg by mouth 2 (two) times daily.    . pentosan polysulfate (ELMIRON) 100 MG capsule Take 1 capsule (100 mg total) by mouth 3 (three) times daily. Reported on 01/05/2016 (Patient taking differently: Take 100 mg by mouth 3 (three) times daily with meals as needed. Reported on 01/05/2016) 90 capsule 11  . traMADol (ULTRAM) 50 MG tablet Take 1-2 tablets (50-100 mg total) by mouth 3 (three) times daily as needed for moderate pain. (Patient taking differently: Take 50-100 mg by mouth 3 (three) times daily as needed for moderate pain (pt uses TID). ) 180 tablet 0  . traZODone (DESYREL) 100 MG tablet Take 1 tablet (100 mg total) by mouth at bedtime. 180 tablet 1  . Vitamin D, Ergocalciferol, (DRISDOL) 50000 units CAPS capsule TAKE ONE CAPSULE BY MOUTH ONE TIME PER WEEK 10 capsule 0   Current Facility-Administered Medications  Medication Dose Route Frequency  Provider Last Rate Last Dose  . 0.9 %  sodium chloride infusion  500 mL Intravenous Continuous Armbruster, Carlota Raspberry, MD      . 0.9 %  sodium chloride infusion  500 mL Intravenous Continuous Armbruster, Carlota Raspberry, MD       Facility-Administered Medications Ordered in Other Visits  Medication Dose Route Frequency Provider Last Rate Last Dose  . bupivacaine (MARCAINE) 0.5 % 10 mL, triamcinolone acetonide (KENALOG-40) 40 mg injection   Subcutaneous Once Carolan Clines, MD      . bupivacaine (MARCAINE) 0.5 % 15 mL, phenazopyridine (PYRIDIUM) 400 mg bladder mixture   Bladder Instillation Once  Carolan Clines, MD         Past Medical History:  Diagnosis Date  . Allergy   . Anemia   . Anxiety    past hx   . Arthritis   . Asthma   . Atrial flutter ([place])    past history- not current  . CAD (coronary artery disease) CARDIOLOGIST--  DR Angelena Form   mild non-obstructive cad  . Cancer ([place])    right  . Cataract    bilaterally removed   . Chronic constipation   . Chronic kidney disease    interstitial cystitis  . COPD (chronic obstructive pulmonary disease) ([place])   . Depression    past hx   . Family history of malignant hyperthermia    father had this  . Fibromyalgia   . Frequency of urination   . GERD (gastroesophageal reflux disease)   . H/O hiatal hernia   . History of basal cell carcinoma excision    X2  . History of breast cancer ONCOLOGIST-- DR Jana Hakim---  NO RECURRANCE   DX 07/2012;  LOW GRADE DCIS  ER+PR+  ----  S/P RIGHT LUMPECTOMY WITH NEGATIVE MARGINS/   RADIATION ENDED 11/2012  . History of chronic bronchitis   . History of colonic polyps   . History of tachycardia    CONTROLLED  WITH ATENOLOL  . Hyperlipidemia   . Hypertension   . Neuromuscular disorder (HCC)    fibromyalgia  . Osteoporosis   . Pelvic pain   . Personal history of radiation therapy   . S/P radiation therapy 11/12/12 - 12/05/12   right Breast  . Sepsis ([place]) 2014   from UTI   . Sinus headache   . Urgency of urination     Past Surgical History:  Procedure Laterality Date  . [place] STUDY N/A 04/03/2016   Procedure: [place] STUDY;  Surgeon: Manus Gunning, MD;  Location: [facility];  Service: Gastroenterology;  Laterality: N/A;  . BREAST LUMPECTOMY Right 10-11-2012   W/ SLN BX  . CARDIAC CATHETERIZATION  09-13-2007  DR Lia Foyer   WELL-PRESERVED LVF/  DIFFUSE SCATTERED CORONARY CALCIFACATION AND ATHEROSCLEROSIS WITHOUT OBSTRUCTION  . CARDIAC CATHETERIZATION  08-04-2010  DR Angelena Form   NON-OBSTRUCTIVE CAD/  pLAD 40%/  oLAD 30%/  mLAD 30%/  pRCA 30%/  EF 60%  .  CARDIOVASCULAR STRESS TEST  06-18-2012  DR McALHANY   LOW RISK NUCLEAR STUDY/  SMALL FIXED AREA OF MODERATELY DECREASED UPTAKE IN ANTEROSEPTAL WALL WHICH MAY BE ARTIFACTUAL/  NO ISCHEMIA/  EF 68%  . COLONOSCOPY  09-29-2010  . CYSTOSCOPY    . CYSTOSCOPY WITH HYDRODISTENSION AND BIOPSY N/A 03/06/2014   Procedure: CYSTOSCOPY/HYDRODISTENSION/ INSTILATION OF MARCAINE AND PYRIDIUM;  Surgeon: Ailene Rud, MD;  Location[facility];  Service: Urology;  Laterality: N/A;  . ESOPHAGEAL MANOMETRY N/A 04/03/2016  Procedure: ESOPHAGEAL MANOMETRY (EM);  Surgeon: Manus Gunning, MD;  Location: [facility];  Service: Gastroenterology;  Laterality: N/A;  . NASAL SINUS SURGERY  1985  . ORIF RIGHT ANKLE  FX  2006  . POLYPECTOMY    . REMOVAL VOCAL CORD CYST  FEB 2014  . RIGHT BREAST BX  08-23-2012  . RIGHT HAND SURGERY  X3  LAST ONE 2009   INCLUDES  ORIF RIGHT 5TH FINGER AND REVISION TWICE  . TONSILLECTOMY AND ADENOIDECTOMY  AGE 63  . TOTAL ABDOMINAL HYSTERECTOMY W/ BILATERAL SALPINGOOPHORECTOMY  1982   W/  APPENDECTOMY  . TRANSTHORACIC ECHOCARDIOGRAM  06-24-2012   GRADE I DIASTOLIC DYSFUNCTION/  EF 55-60%/  MILD MR  . UPPER GASTROINTESTINAL ENDOSCOPY      Social History   Socioeconomic History  . Marital status: Married    Spouse name: Jori Moll   . Number of children: 3  . Years of education: BA  . Highest education level: Not on file  Social Needs  . Financial resource strain: Not on file  . Food insecurity - worry: Not on file  . Food insecurity - inability: Not on file  . Transportation needs - medical: Not on file  . Transportation needs - non-medical: Not on file  Occupational History  . Occupation: Retired Programmer, multimedia: RETIRED  Tobacco Use  . Smoking status: Former Smoker    Packs/day: 1.00    Years: 15.00    Pack years: 15.00    Types: Cigarettes    Last attempt to quit: 05/27/2005    Years since quitting: 12.6  . Smokeless tobacco: Never Used    Substance and Sexual Activity  . Alcohol use: No    Alcohol/week: 0.0 oz  . Drug use: No  . Sexual activity: Yes    Partners: Male    Comment: 1st intercourse 72 yo-Fewer than 5 partners  Other Topics Concern  . Not on file  Social History Narrative   Lives with husband.   Caffeine use: 1/2 cup per day   Exercise-- 2days a week [employer],  Water aerobics, walking     Family History  Problem Relation Age of Onset  . Rectal cancer Mother   . Colon cancer Mother   . Breast cancer Mother   . Pancreatic cancer Mother   . Breast cancer Maternal Aunt        breast  . Irritable bowel syndrome Son   . Irritable bowel syndrome Daughter   . Colon cancer Father   . Colon polyps Father   . Breast cancer Paternal Aunt   . Breast cancer Paternal Aunt   . Esophageal cancer Neg Hx   . Stomach cancer Neg Hx     ROS: no fevers or chills, productive cough, hemoptysis, dysphasia, odynophagia, melena, hematochezia, dysuria, hematuria, rash, seizure activity, orthopnea, PND, pedal edema, claudication. Remaining systems are negative.  Physical Exam: Well-developed well-nourished in no acute distress.  Skin is warm and dry.  HEENT is normal.  Neck is supple.  Chest is clear to auscultation with normal expansion.  Cardiovascular exam is regular rate and rhythm.  Abdominal exam nontender or distended. No masses palpated. Extremities show no edema. neuro grossly intact  A/P  1 coronary artery disease-continue aspirin.  Intolerant to statins.  2 hypertension-blood pressure is controlled.  Continue present medications.  3 chest pain-most recent nuclear study showed no ischemia.  4 hyperlipidemia-intolerant to statins.  She is tolerating Repatha.  Kirk Ruths, MD

## 2018-01-28 ENCOUNTER — Ambulatory Visit: Payer: Medicare Other | Admitting: Cardiology

## 2018-01-28 ENCOUNTER — Encounter: Payer: Self-pay | Admitting: Cardiology

## 2018-01-28 VITALS — BP 122/81 | HR 72 | Ht 64.4 in | Wt 126.6 lb

## 2018-01-28 DIAGNOSIS — E7800 Pure hypercholesterolemia, unspecified: Secondary | ICD-10-CM

## 2018-01-28 DIAGNOSIS — I10 Essential (primary) hypertension: Secondary | ICD-10-CM | POA: Diagnosis not present

## 2018-01-28 DIAGNOSIS — I2510 Atherosclerotic heart disease of native coronary artery without angina pectoris: Secondary | ICD-10-CM

## 2018-01-28 NOTE — Patient Instructions (Signed)
Your physician wants you to follow-up in: 6 MONTHS WITH DR CRENSHAW You will receive a reminder letter in the mail two months in advance. If you don't receive a letter, please call our office to schedule the follow-up appointment.   If you need a refill on your cardiac medications before your next appointment, please call your pharmacy.  

## 2018-01-31 ENCOUNTER — Other Ambulatory Visit: Payer: Self-pay | Admitting: Pharmacist Clinician (PhC)/ Clinical Pharmacy Specialist

## 2018-01-31 DIAGNOSIS — E785 Hyperlipidemia, unspecified: Secondary | ICD-10-CM

## 2018-02-01 ENCOUNTER — Telehealth: Payer: Self-pay | Admitting: Neurology

## 2018-02-01 NOTE — Telephone Encounter (Signed)
Pt has called to inform that she needs another order for physical therapy because last time she was going  she had to stop due to her husband falling ill.  Please call

## 2018-02-04 ENCOUNTER — Other Ambulatory Visit: Payer: Self-pay | Admitting: Neurology

## 2018-02-04 DIAGNOSIS — M542 Cervicalgia: Secondary | ICD-10-CM

## 2018-02-04 NOTE — Telephone Encounter (Signed)
Yes please order

## 2018-02-04 NOTE — Telephone Encounter (Signed)
Called patient to make her aware that the referral for PT was placed as it was previously ordered in July. No answer. LVM for the patient to return call. When pt calls please make her aware that the order is placed and will be sent to the referral dept and they will help with getting that set up.

## 2018-02-05 ENCOUNTER — Ambulatory Visit: Payer: Medicare Other | Admitting: Physical Therapy

## 2018-02-06 DIAGNOSIS — R6889 Other general symptoms and signs: Secondary | ICD-10-CM | POA: Diagnosis not present

## 2018-02-06 DIAGNOSIS — J101 Influenza due to other identified influenza virus with other respiratory manifestations: Secondary | ICD-10-CM | POA: Diagnosis not present

## 2018-02-06 DIAGNOSIS — I10 Essential (primary) hypertension: Secondary | ICD-10-CM | POA: Diagnosis not present

## 2018-02-06 NOTE — Telephone Encounter (Signed)
Pt returned call and is aware of what was noted

## 2018-02-07 ENCOUNTER — Ambulatory Visit: Payer: Medicare Other | Admitting: Physical Therapy

## 2018-02-19 DIAGNOSIS — J101 Influenza due to other identified influenza virus with other respiratory manifestations: Secondary | ICD-10-CM | POA: Diagnosis not present

## 2018-02-19 DIAGNOSIS — E782 Mixed hyperlipidemia: Secondary | ICD-10-CM | POA: Diagnosis not present

## 2018-02-19 DIAGNOSIS — J111 Influenza due to unidentified influenza virus with other respiratory manifestations: Secondary | ICD-10-CM | POA: Diagnosis not present

## 2018-02-19 DIAGNOSIS — M797 Fibromyalgia: Secondary | ICD-10-CM | POA: Diagnosis not present

## 2018-02-19 DIAGNOSIS — I10 Essential (primary) hypertension: Secondary | ICD-10-CM | POA: Diagnosis not present

## 2018-02-25 ENCOUNTER — Ambulatory Visit: Payer: Medicare Other | Admitting: Physical Therapy

## 2018-02-25 DIAGNOSIS — M542 Cervicalgia: Secondary | ICD-10-CM | POA: Diagnosis not present

## 2018-02-25 DIAGNOSIS — M6281 Muscle weakness (generalized): Secondary | ICD-10-CM

## 2018-02-25 DIAGNOSIS — M62838 Other muscle spasm: Secondary | ICD-10-CM

## 2018-02-25 NOTE — Therapy (Signed)
[facility] [place] [place], [place], [address] Phone: [phone]   Fax:  [phone]  Physical Therapy Evaluation  Patient Details  Name: [name] MRN: [id number] Date of Birth: Sep 25, 1946 Referring Provider: Dr Sarina Ill   Encounter Date: 02/25/2018  PT End of Session - 02/25/18 1411    Visit Number  1    Number of Visits  12    Date for PT Re-Evaluation  04/08/18    PT Start Time  7035    PT Stop Time  1522    PT Time Calculation (min)  71 min    Activity Tolerance  Patient tolerated treatment well       Past Medical History:  Diagnosis Date  . Allergy   . Anemia   . Anxiety    past hx   . Arthritis   . Asthma   . Atrial flutter ([place])    past history- not current  . CAD (coronary artery disease) CARDIOLOGIST--  DR Angelena Form   mild non-obstructive cad  . Cancer ([place])    right  . Cataract    bilaterally removed   . Chronic constipation   . Chronic kidney disease    interstitial cystitis  . COPD (chronic obstructive pulmonary disease) ([place])   . Depression    past hx   . Family history of malignant hyperthermia    father had this  . Fibromyalgia   . Frequency of urination   . GERD (gastroesophageal reflux disease)   . H/O hiatal hernia   . History of basal cell carcinoma excision    X2  . History of breast cancer ONCOLOGIST-- DR Jana Hakim---  NO RECURRANCE   DX 07/2012;  LOW GRADE DCIS  ER+PR+  ----  S/P RIGHT LUMPECTOMY WITH NEGATIVE MARGINS/   RADIATION ENDED 11/2012  . History of chronic bronchitis   . History of colonic polyps   . History of tachycardia    CONTROLLED  WITH ATENOLOL  . Hyperlipidemia   . Hypertension   . Neuromuscular disorder (HCC)    fibromyalgia  . Osteoporosis   . Pelvic pain   . Personal history of radiation therapy   . S/P radiation therapy 11/12/12 - 12/05/12   right Breast  . Sepsis ([place]) 2014   from UTI   . Sinus headache   . Urgency of urination      Past Surgical History:  Procedure Laterality Date  . [place] STUDY N/A 04/03/2016   Procedure: [place] STUDY;  Surgeon: Manus Gunning, MD;  Location: [facility];  Service: Gastroenterology;  Laterality: N/A;  . BREAST LUMPECTOMY Right 10-11-2012   W/ SLN BX  . CARDIAC CATHETERIZATION  09-13-2007  DR Lia Foyer   WELL-PRESERVED LVF/  DIFFUSE SCATTERED CORONARY CALCIFACATION AND ATHEROSCLEROSIS WITHOUT OBSTRUCTION  . CARDIAC CATHETERIZATION  08-04-2010  DR Angelena Form   NON-OBSTRUCTIVE CAD/  pLAD 40%/  oLAD 30%/  mLAD 30%/  pRCA 30%/  EF 60%  . CARDIOVASCULAR STRESS TEST  06-18-2012  DR McALHANY   LOW RISK NUCLEAR STUDY/  SMALL FIXED AREA OF MODERATELY DECREASED UPTAKE IN ANTEROSEPTAL WALL WHICH MAY BE ARTIFACTUAL/  NO ISCHEMIA/  EF 68%  . COLONOSCOPY  09-29-2010  . CYSTOSCOPY    . CYSTOSCOPY WITH HYDRODISTENSION AND BIOPSY N/A 03/06/2014   Procedure: CYSTOSCOPY/HYDRODISTENSION/ INSTILATION OF MARCAINE AND PYRIDIUM;  Surgeon: Ailene Rud, MD;  Location: [facility];  Service: Urology;  Laterality: N/A;  . ESOPHAGEAL MANOMETRY N/A 04/03/2016  Procedure: ESOPHAGEAL MANOMETRY (EM);  Surgeon: Manus Gunning, MD;  Location: [facility];  Service: Gastroenterology;  Laterality: N/A;  . NASAL SINUS SURGERY  1985  . ORIF RIGHT ANKLE  FX  2006  . POLYPECTOMY    . REMOVAL VOCAL CORD CYST  FEB 2014  . RIGHT BREAST BX  08-23-2012  . RIGHT HAND SURGERY  X3  LAST ONE 2009   INCLUDES  ORIF RIGHT 5TH FINGER AND REVISION TWICE  . TONSILLECTOMY AND ADENOIDECTOMY  AGE 59  . TOTAL ABDOMINAL HYSTERECTOMY W/ BILATERAL SALPINGOOPHORECTOMY  1982   W/  APPENDECTOMY  . TRANSTHORACIC ECHOCARDIOGRAM  06-24-2012   GRADE I DIASTOLIC DYSFUNCTION/  EF 55-60%/  MILD MR  . UPPER GASTROINTESTINAL ENDOSCOPY      There were no vitals filed for this visit.   Subjective Assessment - 02/25/18 1411    Subjective  [name] reports her neck began to flare up the end of October  after her husband had a massive heart attack and she had to spend a lot of time in ICU and in the hospital. then the holidays came and her daughter in law moved in so she has gotten out of her regular routine.  She tried to get back into the gym worked on the treadmill and developed more pain in her hip and neck, Changed to stationary bike and a Physiological scientist. Was diong ok with that however the neck pain has continued and he is having more headaches.  Then she got the flut 6 weeks ago and is now really sore and fatigued.     Patient Stated Goals  be back in "shape" for her wedding anniversary trip in June. Get back into her exercise.     Currently in Pain?  Yes    Pain Score  7     Pain Location  Neck    Pain Orientation  Left;Right Rt > Lt    Pain Descriptors / Indicators  Tightness;Shooting    Pain Type  Chronic pain    Pain Radiating Towards  will occassionally shoot around to the front of her chest. At times will have earaches along with the neck    Pain Onset  More than a month ago    Pain Frequency  Constant    Aggravating Factors   certain movements    Pain Relieving Factors  heat         [facility] PT Assessment - 02/25/18 0001      Assessment   Medical Diagnosis  Musculoskeletal neck pain    Referring Provider  Dr Sarina Ill    Onset Date/Surgical Date  12/28/17    Hand Dominance  Right    Next MD Visit  may     Prior Therapy  yes last year      Precautions   Precautions  -- osetoporosis      Balance Screen   Has the patient fallen in the past 6 months  No      Prior Function   Level of Independence  Independent    Vocation  Retired    Leisure  spend time with her husband, travel and visit with friends      Observation/Other Assessments   Focus on Therapeutic Outcomes (FOTO)   60% limited      Posture/Postural Control   Posture/Postural Control  Postural limitations    Postural Limitations  Rounded Shoulders;Forward head;Increased thoracic kyphosis      ROM /  Strength   AROM / PROM /  Strength  AROM;Strength      AROM   AROM Assessment Site  Cervical;Shoulder    Right/Left Shoulder  -- WFL however flex limited ~ 130.    Cervical Flexion  60    Cervical Extension  58 with pain    Cervical - Right Side Bend  44    Cervical - Left Side Bend  39    Cervical - Right Rotation  73    Cervical - Left Rotation  68      Strength   Overall Strength Comments  mid back 4-/5    Strength Assessment Site  Shoulder;Elbow    Right/Left Shoulder  -- WNL    Right/Left Elbow  -- WNL      Palpation   Palpation comment  tender and tight in bilat SCM Rt > LT, scalenes, upper traps and levators.       Special Tests    Special Tests  Cervical             Objective measurements completed on examination: See above findings.      [facility] Adult PT Treatment/Exercise - 02/25/18 0001      Modalities   Modalities  Electrical Stimulation;Moist Heat      Moist Heat Therapy   Number Minutes Moist Heat  20 Minutes    Moist Heat Location  Cervical and upper shoulders, sCM      Electrical Stimulation   Electrical Stimulation Location  SCM and upper traps bila    Electrical Stimulation Action  IFC    Electrical Stimulation Parameters  to tolerance    Electrical Stimulation Goals  Pain;Tone      Manual Therapy   Manual Therapy  Soft tissue mobilization    Soft tissue mobilization  STM to bilat upper traps, levators, scalenes, SCM and periocciput       Trigger Point Dry Needling - 02/25/18 1655    Consent Given?  Yes    Education Handout Provided  No in supine    Muscles Treated Upper Body  Upper trapezius;Sternocleidomastoid scalenes bilat for all    Sternocleidomastoid Response  Twitch response elicited;Palpable increased muscle length    Upper Trapezius Response  Palpable increased muscle length;Twitch reponse elicited           PT Education - 02/25/18 1657    Education provided  Yes    Education Details  Pt to slowly restart her HEP     Person(s) Educated  Patient    Methods  Explanation    Comprehension  Verbalized understanding          PT Long Term Goals - 02/25/18 1703      PT LONG TERM GOAL #1   Title  independent with HEP per her previous level  (04/08/18)    Time  6    Period  Weeks    Status  New      PT LONG TERM GOAL #2   Title  perform cervical ROM without pain and WFL ( 04/08/18)     Period  Weeks    Status  New      PT LONG TERM GOAL #3   Title  report 75% improvement in symtoms and ability to eat her her previous level (04/08/18)    Time  6    Period  Weeks    Status  New      PT LONG TERM GOAL #4   Title  improve FOTO =/< 48% limited ( 04/08/18)     Time  6    Period  Weeks    Status  New      PT LONG TERM GOAL #5   Title  demo upper back strength =/> 4+/5 (04/08/18)      Time  6    Period  Weeks    Status  New             Plan - 02/25/18 1658    Clinical Impression Statement  [name] has had a lot of family stress with husband and son having medical issues,  then she developed the flu sick weeks ago.  All of this has caused her to get out of her normal routine and she has not been able to exercise.  OVer this time her neck has tightened up and she is having frequent ear aches and pain with chewing. She has some limtations in cervical ROM and alot of muscular tightness around her neck, upper shoulders and jaw.  Overall her upper body strenght is good, some weakness in the upper back.  She responded well to todays treament and reported decreased pain with jaw movements. She should respond well to therapy and be able to resume her prior level of activity.     Clinical Presentation  Stable    Clinical Decision Making  Low    Rehab Potential  Excellent    PT Frequency  2x / week    PT Duration  6 weeks    PT Treatment/Interventions  Dry needling;Manual techniques;Moist Heat;Therapeutic activities;Patient/family education;Taping;Therapeutic exercise;Ultrasound;Cryotherapy;Electrical  Stimulation;Passive range of motion    PT Next Visit Plan  manual work cervical and upper shoulders, scalenes, SCM.      Consulted and Agree with Plan of Care  Patient       Patient will benefit from skilled therapeutic intervention in order to improve the following deficits and impairments:  Pain, Increased muscle spasms, Postural dysfunction, Impaired UE functional use, Decreased strength, Decreased range of motion  Visit Diagnosis: Cervicalgia - Plan: PT plan of care cert/re-cert  Muscle weakness (generalized) - Plan: PT plan of care cert/re-cert  Other muscle spasm - Plan: PT plan of care cert/re-cert     Problem List Patient Active Problem List   Diagnosis Date Noted  . Excessive postexertional fatigue 04/02/2017  . Pneumonia due to virus 04/02/2017  . Chronic interstitial cystitis 02/01/2017  . Acute pharyngitis 01/02/2017  . Dysphagia   . Esophageal reflux   . Allergic rhinitis 01/25/2016  . Shortness of breath   . Left hip pain 04/30/2015  . Sciatica 04/30/2015  . Knee pain, right 04/30/2015  . Leukocytes in urine 04/30/2015  . Hyperlipidemia 03/01/2015  . Bilateral hand numbness 03/01/2015  . Pulmonary nodules 02/12/2015  . External hemorrhoids 02/05/2015  . Chronic night sweats 01/08/2015  . Atypical chest pain 01/01/2015  . Chest pain 01/01/2015  . Renal insufficiency 07/16/2014  . Sepsis secondary to UTI ([place]) 02/05/2014  . Grief reaction 12/29/2013  . Urinary hesitancy 12/04/2013  . Abdominal pain, other specified site 10/26/2013  . Headache(784.0) 10/13/2013  . Pain, joint, multiple sites 08/18/2013  . Vasculitis ([place]) 07/29/2013  . ANA positive 07/29/2013  . Other and unspecified hyperlipidemia 07/12/2013  . Contusion, chest wall 04/30/2013  . Malignant neoplasm of lower-outer quadrant of right breast of female, estrogen receptor positive ([place]) 01/03/2013  . Splenic lesion 09/02/2012  . Asthma, allergic 08/05/2012  . GERD (gastroesophageal reflux  disease) 06/03/2012  . Former heavy tobacco smoker 06/03/2012  . Edema 04/08/2012  . Stricture and stenosis  of esophagus 10/19/2011  . Constipation - functional 07/21/2011  . Nonspecific (abnormal) findings on radiological and other examination of biliary tract 07/21/2011  . Osteoporosis 04/17/2011  . POSTMENOPAUSAL STATUS 01/27/2011  . FIBROCYSTIC BREAST DISEASE, HX OF 10/18/2010  . Essential hypertension 08/25/2010  . CAD in native artery 08/25/2010  . TOBACCO ABUSE, HX OF 08/25/2010  . GENERALIZED ANXIETY DISORDER 08/03/2010  . FIBROMYALGIA 01/11/2010  . SKIN CANCER, HX OF 01/11/2010    Jeral Pinch PT  02/25/2018, 5:07 PM  [facility] [place], [place], [address] Phone: [phone]   Fax:  [phone]  Name: [name] MRN: [id number] Date of Birth: 31-Aug-1946

## 2018-02-27 ENCOUNTER — Ambulatory Visit: Payer: Medicare Other | Admitting: Physical Therapy

## 2018-02-27 ENCOUNTER — Encounter: Payer: Self-pay | Admitting: Physical Therapy

## 2018-02-27 DIAGNOSIS — M62838 Other muscle spasm: Secondary | ICD-10-CM | POA: Diagnosis not present

## 2018-02-27 DIAGNOSIS — M6281 Muscle weakness (generalized): Secondary | ICD-10-CM

## 2018-02-27 DIAGNOSIS — M542 Cervicalgia: Secondary | ICD-10-CM

## 2018-02-27 NOTE — Therapy (Signed)
[facility] [place], [place], [address] Phone: [phone]   Fax:  [phone]  Physical Therapy Treatment  Patient Details  Name: [name] MRN: [id number] Date of Birth: 09-20-1946 Referring Provider: Dr Sarina Ill   Encounter Date: 02/27/2018  PT End of Session - 02/27/18 1519    Visit Number  2    Number of Visits  12    Date for PT Re-Evaluation  04/08/18    PT Start Time  1519    PT Stop Time  1612    PT Time Calculation (min)  53 min    Activity Tolerance  Patient tolerated treatment well       Past Medical History:  Diagnosis Date  . Allergy   . Anemia   . Anxiety    past hx   . Arthritis   . Asthma   . Atrial flutter ([place])    past history- not current  . CAD (coronary artery disease) CARDIOLOGIST--  DR Angelena Form   mild non-obstructive cad  . Cancer ([place])    right  . Cataract    bilaterally removed   . Chronic constipation   . Chronic kidney disease    interstitial cystitis  . COPD (chronic obstructive pulmonary disease) ([place])   . Depression    past hx   . Family history of malignant hyperthermia    father had this  . Fibromyalgia   . Frequency of urination   . GERD (gastroesophageal reflux disease)   . H/O hiatal hernia   . History of basal cell carcinoma excision    X2  . History of breast cancer ONCOLOGIST-- DR Jana Hakim---  NO RECURRANCE   DX 07/2012;  LOW GRADE DCIS  ER+PR+  ----  S/P RIGHT LUMPECTOMY WITH NEGATIVE MARGINS/   RADIATION ENDED 11/2012  . History of chronic bronchitis   . History of colonic polyps   . History of tachycardia    CONTROLLED  WITH ATENOLOL  . Hyperlipidemia   . Hypertension   . Neuromuscular disorder (HCC)    fibromyalgia  . Osteoporosis   . Pelvic pain   . Personal history of radiation therapy   . S/P radiation therapy 11/12/12 - 12/05/12   right Breast  . Sepsis ([place]) 2014   from UTI   . Sinus headache   . Urgency of urination      Past Surgical History:  Procedure Laterality Date  . [place] STUDY N/A 04/03/2016   Procedure: [place] STUDY;  Surgeon: Manus Gunning, MD;  Location: [facility];  Service: Gastroenterology;  Laterality: N/A;  . BREAST LUMPECTOMY Right 10-11-2012   W/ SLN BX  . CARDIAC CATHETERIZATION  09-13-2007  DR Lia Foyer   WELL-PRESERVED LVF/  DIFFUSE SCATTERED CORONARY CALCIFACATION AND ATHEROSCLEROSIS WITHOUT OBSTRUCTION  . CARDIAC CATHETERIZATION  08-04-2010  DR Angelena Form   NON-OBSTRUCTIVE CAD/  pLAD 40%/  oLAD 30%/  mLAD 30%/  pRCA 30%/  EF 60%  . CARDIOVASCULAR STRESS TEST  06-18-2012  DR McALHANY   LOW RISK NUCLEAR STUDY/  SMALL FIXED AREA OF MODERATELY DECREASED UPTAKE IN ANTEROSEPTAL WALL WHICH MAY BE ARTIFACTUAL/  NO ISCHEMIA/  EF 68%  . COLONOSCOPY  09-29-2010  . CYSTOSCOPY    . CYSTOSCOPY WITH HYDRODISTENSION AND BIOPSY N/A 03/06/2014   Procedure: CYSTOSCOPY/HYDRODISTENSION/ INSTILATION OF MARCAINE AND PYRIDIUM;  Surgeon: Ailene Rud, MD;  Location: [facility]  Service: Urology;  Laterality: N/A;  . ESOPHAGEAL MANOMETRY N/A 04/03/2016  Procedure: ESOPHAGEAL MANOMETRY (EM);  Surgeon: Manus Gunning, MD;  Location: [facility];  Service: Gastroenterology;  Laterality: N/A;  . NASAL SINUS SURGERY  1985  . ORIF RIGHT ANKLE  FX  2006  . POLYPECTOMY    . REMOVAL VOCAL CORD CYST  FEB 2014  . RIGHT BREAST BX  08-23-2012  . RIGHT HAND SURGERY  X3  LAST ONE 2009   INCLUDES  ORIF RIGHT 5TH FINGER AND REVISION TWICE  . TONSILLECTOMY AND ADENOIDECTOMY  AGE 58  . TOTAL ABDOMINAL HYSTERECTOMY W/ BILATERAL SALPINGOOPHORECTOMY  1982   W/  APPENDECTOMY  . TRANSTHORACIC ECHOCARDIOGRAM  06-24-2012   GRADE I DIASTOLIC DYSFUNCTION/  EF 55-60%/  MILD MR  . UPPER GASTROINTESTINAL ENDOSCOPY      There were no vitals filed for this visit.  Subjective Assessment - 02/27/18 1521    Subjective  [name] reports that she is able to chew and eat without any pain  now.  she is still a little sore in the Rt SCM. Her pain is now posterior neck and Rt medial shoulder blade area.    Patient Stated Goals  be back in "shape" for her wedding anniversary trip in June. Get back into her exercise.     Currently in Pain?  Yes    Pain Score  6     Pain Location  Neck    Pain Orientation  Right;Left                      [facility] Adult PT Treatment/Exercise - 02/27/18 0001      Modalities   Modalities  Electrical Stimulation;Moist Heat      Moist Heat Therapy   Number Minutes Moist Heat  20 Minutes    Moist Heat Location  Cervical and thoracic      Electrical Stimulation   Electrical Stimulation Location  upper traps and rt rhomboids    Electrical Stimulation Action  IFC    Electrical Stimulation Parameters  to tolerance    Electrical Stimulation Goals  Pain;Tone      Manual Therapy   Manual Therapy  Soft tissue mobilization    Soft tissue mobilization  STM bilat upper traps       Trigger Point Dry Needling - 02/27/18 1553    Consent Given?  Yes    Education Handout Provided  No    Muscles Treated Upper Body  Upper trapezius;Rhomboids    Upper Trapezius Response  Palpable increased muscle length;Twitch reponse elicited bilat, Rt more reactive than Lt    Rhomboids Response  Palpable increased muscle length;Twitch response elicited Rt lower                PT Long Term Goals - 02/25/18 1703      PT LONG TERM GOAL #1   Title  independent with HEP per her previous level  (04/08/18)    Time  6    Period  Weeks    Status  New      PT LONG TERM GOAL #2   Title  perform cervical ROM without pain and WFL ( 04/08/18)     Period  Weeks    Status  New      PT LONG TERM GOAL #3   Title  report 75% improvement in symtoms and ability to eat her her previous level (04/08/18)    Time  6    Period  Weeks    Status  New      PT  LONG TERM GOAL #4   Title  improve FOTO =/< 48% limited ( 04/08/18)     Time  6    Period  Weeks    Status   New      PT LONG TERM GOAL #5   Title  demo upper back strength =/> 4+/5 (04/08/18)      Time  [place] - 02/27/18 1556    Clinical Impression Statement  [name] had almost complete relief of jaw and ear pain after her first treatment, Pain still in the neck and intrascapular Rt side.  Rt side was very reactive to treatment however pt reported decreased pain at end of treatment.     Rehab Potential  Excellent    PT Frequency  2x / week    PT Duration  6 weeks    PT Treatment/Interventions  Dry needling;Manual techniques;Moist Heat;Therapeutic activities;Patient/family education;Taping;Therapeutic exercise;Ultrasound;Cryotherapy;Electrical Stimulation;Passive range of motion    PT Next Visit Plan  manual work cervical and upper shoulders, scalenes, SCM.      Consulted and Agree with Plan of Care  Patient       Patient will benefit from skilled therapeutic intervention in order to improve the following deficits and impairments:  Pain, Increased muscle spasms, Postural dysfunction, Impaired UE functional use, Decreased strength, Decreased range of motion  Visit Diagnosis: Cervicalgia  Muscle weakness (generalized)  Other muscle spasm     Problem List Patient Active Problem List   Diagnosis Date Noted  . Excessive postexertional fatigue 04/02/2017  . Pneumonia due to virus 04/02/2017  . Chronic interstitial cystitis 02/01/2017  . Acute pharyngitis 01/02/2017  . Dysphagia   . Esophageal reflux   . Allergic rhinitis 01/25/2016  . Shortness of breath   . Left hip pain 04/30/2015  . Sciatica 04/30/2015  . Knee pain, right 04/30/2015  . Leukocytes in urine 04/30/2015  . Hyperlipidemia 03/01/2015  . Bilateral hand numbness 03/01/2015  . Pulmonary nodules 02/12/2015  . External hemorrhoids 02/05/2015  . Chronic night sweats 01/08/2015  . Atypical chest pain 01/01/2015  . Chest pain 01/01/2015  . Renal insufficiency 07/16/2014  .  Sepsis secondary to UTI ([place]) 02/05/2014  . Grief reaction 12/29/2013  . Urinary hesitancy 12/04/2013  . Abdominal pain, other specified site 10/26/2013  . Headache(784.0) 10/13/2013  . Pain, joint, multiple sites 08/18/2013  . Vasculitis ([place]) 07/29/2013  . ANA positive 07/29/2013  . Other and unspecified hyperlipidemia 07/12/2013  . Contusion, chest wall 04/30/2013  . Malignant neoplasm of lower-outer quadrant of right breast of female, estrogen receptor positive ([place]) 01/03/2013  . Splenic lesion 09/02/2012  . Asthma, allergic 08/05/2012  . GERD (gastroesophageal reflux disease) 06/03/2012  . Former heavy tobacco smoker 06/03/2012  . Edema 04/08/2012  . Stricture and stenosis of esophagus 10/19/2011  . Constipation - functional 07/21/2011  . Nonspecific (abnormal) findings on radiological and other examination of biliary tract 07/21/2011  . Osteoporosis 04/17/2011  . POSTMENOPAUSAL STATUS 01/27/2011  . FIBROCYSTIC BREAST DISEASE, HX OF 10/18/2010  . Essential hypertension 08/25/2010  . CAD in native artery 08/25/2010  . TOBACCO ABUSE, HX OF 08/25/2010  . GENERALIZED ANXIETY DISORDER 08/03/2010  . FIBROMYALGIA 01/11/2010  . SKIN CANCER, HX OF 01/11/2010    Jeral Pinch PT  02/27/2018, 3:58 PM  [facility] [place], [place], [address] Phone: [phone]  Fax:  [phone]  Name: [name] MRN: [id number] Date of Birth: January 25, 1946

## 2018-03-07 ENCOUNTER — Encounter: Payer: Self-pay | Admitting: Physical Therapy

## 2018-03-07 ENCOUNTER — Ambulatory Visit: Payer: Medicare Other | Admitting: Physical Therapy

## 2018-03-07 DIAGNOSIS — M62838 Other muscle spasm: Secondary | ICD-10-CM

## 2018-03-07 DIAGNOSIS — M6281 Muscle weakness (generalized): Secondary | ICD-10-CM | POA: Diagnosis not present

## 2018-03-07 DIAGNOSIS — M542 Cervicalgia: Secondary | ICD-10-CM

## 2018-03-07 NOTE — Therapy (Signed)
[facility] [place], [place], [address] Phone: [phone]   Fax:  [phone]  Physical Therapy Treatment  Patient Details  Name: [name] MRN: [id number] Date of Birth: 1946-10-18 Referring Provider: Dr Sarina Ill   Encounter Date: 03/07/2018  PT End of Session - 03/07/18 1149    Visit Number  3    Number of Visits  12    Date for PT Re-Evaluation  04/08/18    PT Start Time  1149    PT Stop Time  1246    PT Time Calculation (min)  57 min    Activity Tolerance  Patient tolerated treatment well       Past Medical History:  Diagnosis Date  . Allergy   . Anemia   . Anxiety    past hx   . Arthritis   . Asthma   . Atrial flutter ([place])    past history- not current  . CAD (coronary artery disease) CARDIOLOGIST--  DR Angelena Form   mild non-obstructive cad  . Cancer ([place])    right  . Cataract    bilaterally removed   . Chronic constipation   . Chronic kidney disease    interstitial cystitis  . COPD (chronic obstructive pulmonary disease) ([place])   . Depression    past hx   . Family history of malignant hyperthermia    father had this  . Fibromyalgia   . Frequency of urination   . GERD (gastroesophageal reflux disease)   . H/O hiatal hernia   . History of basal cell carcinoma excision    X2  . History of breast cancer ONCOLOGIST-- DR Jana Hakim---  NO RECURRANCE   DX 07/2012;  LOW GRADE DCIS  ER+PR+  ----  S/P RIGHT LUMPECTOMY WITH NEGATIVE MARGINS/   RADIATION ENDED 11/2012  . History of chronic bronchitis   . History of colonic polyps   . History of tachycardia    CONTROLLED  WITH ATENOLOL  . Hyperlipidemia   . Hypertension   . Neuromuscular disorder (HCC)    fibromyalgia  . Osteoporosis   . Pelvic pain   . Personal history of radiation therapy   . S/P radiation therapy 11/12/12 - 12/05/12   right Breast  . Sepsis ([place]) 2014   from UTI   . Sinus headache   . Urgency of urination      Past Surgical History:  Procedure Laterality Date  . [place] STUDY N/A 04/03/2016   Procedure: [place] STUDY;  Surgeon: Manus Gunning, MD;  Location: [facility];  Service: Gastroenterology;  Laterality: N/A;  . BREAST LUMPECTOMY Right 10-11-2012   W/ SLN BX  . CARDIAC CATHETERIZATION  09-13-2007  DR Lia Foyer   WELL-PRESERVED LVF/  DIFFUSE SCATTERED CORONARY CALCIFACATION AND ATHEROSCLEROSIS WITHOUT OBSTRUCTION  . CARDIAC CATHETERIZATION  08-04-2010  DR Angelena Form   NON-OBSTRUCTIVE CAD/  pLAD 40%/  oLAD 30%/  mLAD 30%/  pRCA 30%/  EF 60%  . CARDIOVASCULAR STRESS TEST  06-18-2012  DR McALHANY   LOW RISK NUCLEAR STUDY/  SMALL FIXED AREA OF MODERATELY DECREASED UPTAKE IN ANTEROSEPTAL WALL WHICH MAY BE ARTIFACTUAL/  NO ISCHEMIA/  EF 68%  . COLONOSCOPY  09-29-2010  . CYSTOSCOPY    . CYSTOSCOPY WITH HYDRODISTENSION AND BIOPSY N/A 03/06/2014   Procedure: CYSTOSCOPY/HYDRODISTENSION/ INSTILATION OF MARCAINE AND PYRIDIUM;  Surgeon: Ailene Rud, MD;  Location: [facility];  Service: Urology;  Laterality: N/A;  . ESOPHAGEAL MANOMETRY N/A 04/03/2016  Procedure: ESOPHAGEAL MANOMETRY (EM);  Surgeon: Manus Gunning, MD;  Location: [facility];  Service: Gastroenterology;  Laterality: N/A;  . NASAL SINUS SURGERY  1985  . ORIF RIGHT ANKLE  FX  2006  . POLYPECTOMY    . REMOVAL VOCAL CORD CYST  FEB 2014  . RIGHT BREAST BX  08-23-2012  . RIGHT HAND SURGERY  X3  LAST ONE 2009   INCLUDES  ORIF RIGHT 5TH FINGER AND REVISION TWICE  . TONSILLECTOMY AND ADENOIDECTOMY  AGE 89  . TOTAL ABDOMINAL HYSTERECTOMY W/ BILATERAL SALPINGOOPHORECTOMY  1982   W/  APPENDECTOMY  . TRANSTHORACIC ECHOCARDIOGRAM  06-24-2012   GRADE I DIASTOLIC DYSFUNCTION/  EF 55-60%/  MILD MR  . UPPER GASTROINTESTINAL ENDOSCOPY      There were no vitals filed for this visit.  Subjective Assessment - 03/07/18 1149    Subjective  [name] is alittle sore in the Rt upper shoulder/neck and it will  shoot up the neck.     Patient Stated Goals  be back in "shape" for her wedding anniversary trip in June. Get back into her exercise.     Currently in Pain?  Yes    Pain Score  6     Pain Location  Neck    Pain Orientation  Right    Pain Descriptors / Indicators  Shooting;Tightness    Pain Type  Chronic pain    Pain Onset  More than a month ago    Pain Frequency  Constant    Aggravating Factors   not sure    Pain Relieving Factors  heat                      [facility] Adult PT Treatment/Exercise - 03/07/18 0001      Self-Care   Self-Care  Other Self-Care Comments    Other Self-Care Comments   add back in yellow band scap stab ex      Exercises   Exercises  Other Exercises    Other Exercises   head presses, seated levator stretch and low/mid doorway stretch.       Modalities   Modalities  Electrical Stimulation;Moist Heat      Moist Heat Therapy   Number Minutes Moist Heat  15 Minutes    Moist Heat Location  Cervical;Lumbar Spine thoracici      Electrical Stimulation   Electrical Stimulation Location  bilat cervical and Rt upper trap    Electrical Stimulation Action  IFC    Electrical Stimulation Parameters  to tolerance    Electrical Stimulation Goals  Pain;Tone      Manual Therapy   Manual Therapy  Soft tissue mobilization;Myofascial release    Soft tissue mobilization  STM bilat upper traps, pericervical and Rt SCM    Myofascial Release  occipital base release        Trigger Point Dry Needling - 03/07/18 1156    Consent Given?  Yes    Education Handout Provided  No    Muscles Treated Upper Body  Upper trapezius;Levator scapulae    Upper Trapezius Response  Palpable increased muscle length;Twitch reponse elicited Rt    Levator Scapulae Response  Palpable increased muscle length;Twitch response elicited Rt                PT Long Term Goals - 03/07/18 1234      PT LONG TERM GOAL #1   Title  independent with HEP per her previous level   (04/08/18)  Status  On-going      PT LONG TERM GOAL #2   Title  perform cervical ROM without pain and WFL ( 04/08/18)     Status  On-going      PT LONG TERM GOAL #3   Title  report 75% improvement in symtoms and ability to eat her her previous level (04/08/18)    Status  Achieved      PT LONG TERM GOAL #4   Title  improve FOTO =/< 48% limited ( 04/08/18)     Status  On-going      PT LONG TERM GOAL #5   Title  demo upper back strength =/> 4+/5 (04/08/18)      Status  On-going            Plan - 03/07/18 1233    Clinical Impression Statement  [name] continues with no pain with chewing, Lt neck/upper shoulder are almost painfree now.  She does still have tightness and pain in the Rt neck and upper shoulder musculature. She had good releases with treatment today and is going to add in her band /neck exercises at home.  Making progress to her goals, met one.     Rehab Potential  Excellent    PT Frequency  2x / week    PT Duration  6 weeks    PT Treatment/Interventions  Dry needling;Manual techniques;Moist Heat;Therapeutic activities;Patient/family education;Taping;Therapeutic exercise;Ultrasound;Cryotherapy;Electrical Stimulation;Passive range of motion    PT Next Visit Plan  manual work cervical and upper shoulders, scalenes, SCM.      Consulted and Agree with Plan of Care  Patient       Patient will benefit from skilled therapeutic intervention in order to improve the following deficits and impairments:  Pain, Increased muscle spasms, Postural dysfunction, Impaired UE functional use, Decreased strength, Decreased range of motion  Visit Diagnosis: Muscle weakness (generalized)  Cervicalgia  Other muscle spasm     Problem List Patient Active Problem List   Diagnosis Date Noted  . Excessive postexertional fatigue 04/02/2017  . Pneumonia due to virus 04/02/2017  . Chronic interstitial cystitis 02/01/2017  . Acute pharyngitis 01/02/2017  . Dysphagia   . Esophageal reflux    . Allergic rhinitis 01/25/2016  . Shortness of breath   . Left hip pain 04/30/2015  . Sciatica 04/30/2015  . Knee pain, right 04/30/2015  . Leukocytes in urine 04/30/2015  . Hyperlipidemia 03/01/2015  . Bilateral hand numbness 03/01/2015  . Pulmonary nodules 02/12/2015  . External hemorrhoids 02/05/2015  . Chronic night sweats 01/08/2015  . Atypical chest pain 01/01/2015  . Chest pain 01/01/2015  . Renal insufficiency 07/16/2014  . Sepsis secondary to UTI ([place]) 02/05/2014  . Grief reaction 12/29/2013  . Urinary hesitancy 12/04/2013  . Abdominal pain, other specified site 10/26/2013  . Headache(784.0) 10/13/2013  . Pain, joint, multiple sites 08/18/2013  . Vasculitis ([place]) 07/29/2013  . ANA positive 07/29/2013  . Other and unspecified hyperlipidemia 07/12/2013  . Contusion, chest wall 04/30/2013  . Malignant neoplasm of lower-outer quadrant of right breast of female, estrogen receptor positive ([place]) 01/03/2013  . Splenic lesion 09/02/2012  . Asthma, allergic 08/05/2012  . GERD (gastroesophageal reflux disease) 06/03/2012  . Former heavy tobacco smoker 06/03/2012  . Edema 04/08/2012  . Stricture and stenosis of esophagus 10/19/2011  . Constipation - functional 07/21/2011  . Nonspecific (abnormal) findings on radiological and other examination of biliary tract 07/21/2011  . Osteoporosis 04/17/2011  . POSTMENOPAUSAL STATUS 01/27/2011  . FIBROCYSTIC BREAST DISEASE, HX OF 10/18/2010  .  Essential hypertension 08/25/2010  . CAD in native artery 08/25/2010  . TOBACCO ABUSE, HX OF 08/25/2010  . GENERALIZED ANXIETY DISORDER 08/03/2010  . FIBROMYALGIA 01/11/2010  . SKIN CANCER, HX OF 01/11/2010    Jeral Pinch PT 03/07/2018, 12:37 PM  [facility] [place], [place], [address] Phone: [phone]   Fax:  [phone]  Name: [name] MRN: [id number] Date of Birth: September 18, 1946

## 2018-03-08 DIAGNOSIS — R140 Abdominal distension (gaseous): Secondary | ICD-10-CM | POA: Diagnosis not present

## 2018-03-08 DIAGNOSIS — R7989 Other specified abnormal findings of blood chemistry: Secondary | ICD-10-CM | POA: Diagnosis not present

## 2018-03-08 DIAGNOSIS — I10 Essential (primary) hypertension: Secondary | ICD-10-CM | POA: Diagnosis not present

## 2018-03-11 ENCOUNTER — Encounter: Payer: Self-pay | Admitting: Physical Therapy

## 2018-03-11 ENCOUNTER — Ambulatory Visit: Payer: Medicare Other | Admitting: Physical Therapy

## 2018-03-11 DIAGNOSIS — M542 Cervicalgia: Secondary | ICD-10-CM

## 2018-03-11 DIAGNOSIS — M62838 Other muscle spasm: Secondary | ICD-10-CM | POA: Diagnosis not present

## 2018-03-11 DIAGNOSIS — M6281 Muscle weakness (generalized): Secondary | ICD-10-CM | POA: Diagnosis not present

## 2018-03-11 NOTE — Therapy (Signed)
[place], [place], [address] Phone: [phone]   Fax:  [phone]  Physical Therapy Treatment  Patient Details  Name: [name] MRN: [id number] Date of Birth: [birth year] Referring Provider: Dr Sarina Ill   Encounter Date: 03/11/2018  PT End of Session - 03/11/18 1147    Visit Number  4    Number of Visits  12    Date for PT Re-Evaluation  04/08/18    PT Start Time  1148    PT Stop Time  1238    PT Time Calculation (min)  50 min    Activity Tolerance  Patient tolerated treatment well       Past Medical History:  Diagnosis Date  . Allergy   . Anemia   . Anxiety    past hx   . Arthritis   . Asthma   . Atrial flutter ([place])    past history- not current  . CAD (coronary artery disease) CARDIOLOGIST--  DR Angelena Form   mild non-obstructive cad  . Cancer ([place])    right  . Cataract    bilaterally removed   . Chronic constipation   . Chronic kidney disease    interstitial cystitis  . COPD (chronic obstructive pulmonary disease) ([place])   . Depression    past hx   . Family history of malignant hyperthermia    father had this  . Fibromyalgia   . Frequency of urination   . GERD (gastroesophageal reflux disease)   . H/O hiatal hernia   . History of basal cell carcinoma excision    X2  . History of breast cancer ONCOLOGIST-- DR Jana Hakim---  NO RECURRANCE   DX 07/2012;  LOW GRADE DCIS  ER+PR+  ----  S/P RIGHT LUMPECTOMY WITH NEGATIVE MARGINS/   RADIATION ENDED 11/2012  . History of chronic bronchitis   . History of colonic polyps   . History of tachycardia    CONTROLLED  WITH ATENOLOL  . Hyperlipidemia   . Hypertension   . Neuromuscular disorder (HCC)    fibromyalgia  . Osteoporosis   . Pelvic pain   . Personal history of radiation therapy   . S/P radiation therapy 11/12/12 - 12/05/12   right Breast  . Sepsis ([place]) 2014   from UTI   . Sinus headache   . Urgency of urination      Past Surgical History:  Procedure Laterality Date  . [place] STUDY N/A 04/03/2016   Procedure: [place] STUDY;  Surgeon: Manus Gunning, MD;  [facility];  Service: Gastroenterology;  Laterality: N/A;  . BREAST LUMPECTOMY Right 10-11-2012   W/ SLN BX  . CARDIAC CATHETERIZATION  09-13-2007  DR Lia Foyer   WELL-PRESERVED LVF/  DIFFUSE SCATTERED CORONARY CALCIFACATION AND ATHEROSCLEROSIS WITHOUT OBSTRUCTION  . CARDIAC CATHETERIZATION  08-04-2010  DR Angelena Form   NON-OBSTRUCTIVE CAD/  pLAD 40%/  oLAD 30%/  mLAD 30%/  pRCA 30%/  EF 60%  . CARDIOVASCULAR STRESS TEST  06-18-2012  DR McALHANY   LOW RISK NUCLEAR STUDY/  SMALL FIXED AREA OF MODERATELY DECREASED UPTAKE IN ANTEROSEPTAL WALL WHICH MAY BE ARTIFACTUAL/  NO ISCHEMIA/  EF 68%  . COLONOSCOPY  09-29-2010  . CYSTOSCOPY    . CYSTOSCOPY WITH HYDRODISTENSION AND BIOPSY N/A 03/06/2014   Procedure: CYSTOSCOPY/HYDRODISTENSION/ INSTILATION OF MARCAINE AND PYRIDIUM;  Surgeon: Ailene Rud, MD;  Location[facility];  Service: Urology;  Laterality: N/A;  . ESOPHAGEAL MANOMETRY N/A 04/03/2016  Procedure: ESOPHAGEAL MANOMETRY (EM);  Surgeon: Manus Gunning, MD;  [facility];  Service: Gastroenterology;  Laterality: N/A;  . NASAL SINUS SURGERY  1985  . ORIF RIGHT ANKLE  FX  2006  . POLYPECTOMY    . REMOVAL VOCAL CORD CYST  FEB 2014  . RIGHT BREAST BX  08-23-2012  . RIGHT HAND SURGERY  X3  LAST ONE 2009   INCLUDES  ORIF RIGHT 5TH FINGER AND REVISION TWICE  . TONSILLECTOMY AND ADENOIDECTOMY  AGE 72  . TOTAL ABDOMINAL HYSTERECTOMY W/ BILATERAL SALPINGOOPHORECTOMY  1982   W/  APPENDECTOMY  . TRANSTHORACIC ECHOCARDIOGRAM  06-24-2012   GRADE I DIASTOLIC DYSFUNCTION/  EF 55-60%/  MILD MR  . UPPER GASTROINTESTINAL ENDOSCOPY      There were no vitals filed for this visit.  Subjective Assessment - 03/11/18 1149    Subjective  Sheria is doing better, has a headache today however thinks it is  from the pollen. Hearing a buzzing sensation in her ears with rolling her shoulders and performing head traction.  Not having any more ear pain    Patient Stated Goals  be back in "shape" for her wedding anniversary trip in June. Get back into her exercise.     Currently in Pain?  Yes    Pain Score  3     Pain Location  Neck    Pain Orientation  Right    Pain Descriptors / Indicators  Dull    Pain Type  Chronic pain    Pain Onset  More than a month ago    Pain Frequency  Constant         [facility] PT Assessment - 03/11/18 0001      Assessment   Medical Diagnosis  Musculoskeletal neck pain      AROM   AROM Assessment Site  Cervical    Cervical Flexion  62    Cervical Extension  60    Cervical - Right Side Bend  32    Cervical - Left Side Bend  42    Cervical - Right Rotation  80    Cervical - Left Rotation  62      Strength   Overall Strength Comments  mid back Rt 4+/5, Lt 4-/5                  [facility] Adult PT Treatment/Exercise - 03/11/18 0001      Exercises   Other Exercises   MEEKS decompression series 10 reps each      Modalities   Modalities  Electrical Stimulation;Moist Heat      Moist Heat Therapy   Number Minutes Moist Heat  15 Minutes    Moist Heat Location  Cervical;Lumbar Spine      Electrical Stimulation   Electrical Stimulation Location  bilat cervical and Rt upper trap    Electrical Stimulation Action  IFC    Electrical Stimulation Parameters  to tolerance    Electrical Stimulation Goals  Pain;Tone      Manual Therapy   Manual Therapy  Soft tissue mobilization;Myofascial release    Soft tissue mobilization  STM Lt upper trap, levator, cerical paraspinals and pericoccipital muscles.        Trigger Point Dry Needling - 03/11/18 1210    Consent Given?  Yes    Education Handout Provided  No    Muscles Treated Upper Body  Oblique capitus;Longissimus    Oblique Capitus Response Small pocket of edema/bruising at this sight after tx.  Palpable  increased muscle length;Twitch response elicited Rt    Longissimus Response  Palpable increased muscle length;Twitch response elicited Rt cervical           PT Education - 03/11/18 1204    Education provided  Yes    Education Details  posture realignent routine in supine    Person(s) Educated  Patient    Methods  Explanation;Handout    Comprehension  Returned demonstration;Verbalized understanding          PT Long Term Goals - 03/11/18 1155      PT LONG TERM GOAL #1   Title  independent with HEP per her previous level  (04/08/18)    Status  On-going      PT LONG TERM GOAL #2   Title  perform cervical ROM without pain and WFL ( 04/08/18)       PT LONG TERM GOAL #3   Title  report 75% improvement in symtoms and ability to eat her her previous level (04/08/18)    Status  Achieved      PT LONG TERM GOAL #4   Title  improve FOTO =/< 48% limited ( 04/08/18)     Status  On-going      PT LONG TERM GOAL #5   Title  demo upper back strength =/> 4+/5 (04/08/18)      Status  On-going            Plan - 03/11/18 1226    Clinical Impression Statement  Zanasia is making excellent progress to her goals.  Her pain level has decreased a lot and she is almost at her prior level. Partially met her goals.  If continues to do this well may only need one or two more treatments.     PT Frequency  1x / week    PT Duration  6 weeks    PT Treatment/Interventions  Dry needling;Manual techniques;Moist Heat;Therapeutic activities;Patient/family education;Taping;Therapeutic exercise;Ultrasound;Cryotherapy;Electrical Stimulation;Passive range of motion    PT Next Visit Plan  manual work cervical and upper shoulders, scalenes, SCM.      Consulted and Agree with Plan of Care  Patient       Patient will benefit from skilled therapeutic intervention in order to improve the following deficits and impairments:  Pain, Increased muscle spasms, Postural dysfunction, Impaired UE functional use, Decreased  strength, Decreased range of motion  Visit Diagnosis: Muscle weakness (generalized)  Cervicalgia  Other muscle spasm     Problem List Patient Active Problem List   Diagnosis Date Noted  . Excessive postexertional fatigue 04/02/2017  . Pneumonia due to virus 04/02/2017  . Chronic interstitial cystitis 02/01/2017  . Acute pharyngitis 01/02/2017  . Dysphagia   . Esophageal reflux   . Allergic rhinitis 01/25/2016  . Shortness of breath   . Left hip pain 04/30/2015  . Sciatica 04/30/2015  . Knee pain, right 04/30/2015  . Leukocytes in urine 04/30/2015  . Hyperlipidemia 03/01/2015  . Bilateral hand numbness 03/01/2015  . Pulmonary nodules 02/12/2015  . External hemorrhoids 02/05/2015  . Chronic night sweats 01/08/2015  . Atypical chest pain 01/01/2015  . Chest pain 01/01/2015  . Renal insufficiency 07/16/2014  . Sepsis secondary to UTI ([place]) 02/05/2014  . Grief reaction 12/29/2013  . Urinary hesitancy 12/04/2013  . Abdominal pain, other specified site 10/26/2013  . Headache(784.0) 10/13/2013  . Pain, joint, multiple sites 08/18/2013  . Vasculitis ([place]) 07/29/2013  . ANA positive 07/29/2013  . Other and unspecified hyperlipidemia 07/12/2013  . Contusion, chest wall  04/30/2013  . Malignant neoplasm of lower-outer quadrant of right breast of female, estrogen receptor positive ([place]) 01/03/2013  . Splenic lesion 09/02/2012  . Asthma, allergic 08/05/2012  . GERD (gastroesophageal reflux disease) 06/03/2012  . Former heavy tobacco smoker 06/03/2012  . Edema 04/08/2012  . Stricture and stenosis of esophagus 10/19/2011  . Constipation - functional 07/21/2011  . Nonspecific (abnormal) findings on radiological and other examination of biliary tract 07/21/2011  . Osteoporosis 04/17/2011  . POSTMENOPAUSAL STATUS 01/27/2011  . FIBROCYSTIC BREAST DISEASE, HX OF 10/18/2010  . Essential hypertension 08/25/2010  . CAD in native artery 08/25/2010  . TOBACCO ABUSE, HX OF 08/25/2010   . GENERALIZED ANXIETY DISORDER 08/03/2010  . FIBROMYALGIA 01/11/2010  . SKIN CANCER, HX OF 01/11/2010    Jeral Pinch PT  03/11/2018, 12:29 PM  [facility] [place], [place], [address] Phone: [phone]   Fax:  [phone]  Name: Zeina Akkerman MRN: [id number] Date of Birth: 04/18/1946

## 2018-03-11 NOTE — Patient Instructions (Signed)
  _  Decompression Exercise: Basic   Lie on back on firm surface, knees bent, feet flat, arms turned up, out to sides, backs of hands down. Time _3-5__ minutes. Surface: floor  Shoulder Press   Press both shoulders down. Hold _3-5__ seconds. Repeat _10__ times. Surface: floor   Head Press With Chin Tuck   Tuck chin SLIGHTLY toward chest, keep mouth closed. Feel weight on back of head. Increase weight by pressing head down. Hold _3-5__ seconds. Relax. Repeat __10_ times. Surface: floor   Leg Lengthener: Full   Straighten one leg. Pull toes AND forefoot toward knee, extend heel. Lengthen leg by pulling pelvis away from ribs. Hold _3-5__ seconds. Relax. Repeat 10 times. Re-bend knee. Do other leg. Each leg _10__ times. Surface: floor   Leg Press: Single   Straighten one leg down to floor. Bring toes AND forefoot toward knee, extend heel. Press leg down. DO NOT BEND KNEE. Hold _3-5__ seconds. Relax leg. Repeat exercise 1 time. Relax leg. Re-bend knee. Repeat with other leg. Each leg _10__ times.

## 2018-03-13 ENCOUNTER — Encounter: Payer: Self-pay | Admitting: Nurse Practitioner

## 2018-03-13 ENCOUNTER — Ambulatory Visit: Payer: Medicare Other | Admitting: Nurse Practitioner

## 2018-03-14 ENCOUNTER — Encounter: Payer: Self-pay | Admitting: Physical Therapy

## 2018-03-14 ENCOUNTER — Ambulatory Visit: Payer: Medicare Other | Admitting: Physical Therapy

## 2018-03-14 DIAGNOSIS — M542 Cervicalgia: Secondary | ICD-10-CM

## 2018-03-14 DIAGNOSIS — R42 Dizziness and giddiness: Secondary | ICD-10-CM

## 2018-03-14 DIAGNOSIS — M62838 Other muscle spasm: Secondary | ICD-10-CM

## 2018-03-14 DIAGNOSIS — M6281 Muscle weakness (generalized): Secondary | ICD-10-CM

## 2018-03-14 NOTE — Patient Instructions (Addendum)
     Thoracic Self-Mobilization Stretch (Supine)    With small rolled towel at lower ribs level, gently lie back until stretch is felt. Hold __30-60__ seconds. Relax. Repeat ____ times per set. Do ____ sets per session. Do ____ sessions per day.

## 2018-03-14 NOTE — Therapy (Signed)
[place], [place], [address] Phone: [phone]   Fax:  [phone]  Physical Therapy Treatment  Patient Details  Name: [name] MRN: [id number] Date of Birth: June 08, 1946 Referring Provider: Dr Sarina Ill   Encounter Date: 03/14/2018  PT End of Session - 03/14/18 1147    Visit Number  5    Number of Visits  12    Date for PT Re-Evaluation  04/08/18    PT Start Time  1147    PT Stop Time  1229    PT Time Calculation (min)  42 min    Activity Tolerance  Patient tolerated treatment well       Past Medical History:  Diagnosis Date  . Allergy   . Anemia   . Anxiety    past hx   . Arthritis   . Asthma   . Atrial flutter ([place])    past history- not current  . CAD (coronary artery disease) CARDIOLOGIST--  DR Angelena Form   mild non-obstructive cad  . Cancer ([place])    right  . Cataract    bilaterally removed   . Chronic constipation   . Chronic kidney disease    interstitial cystitis  . COPD (chronic obstructive pulmonary disease) ([place])   . Depression    past hx   . Family history of malignant hyperthermia    father had this  . Fibromyalgia   . Frequency of urination   . GERD (gastroesophageal reflux disease)   . H/O hiatal hernia   . History of basal cell carcinoma excision    X2  . History of breast cancer ONCOLOGIST-- DR Jana Hakim---  NO RECURRANCE   DX 07/2012;  LOW GRADE DCIS  ER+PR+  ----  S/P RIGHT LUMPECTOMY WITH NEGATIVE MARGINS/   RADIATION ENDED 11/2012  . History of chronic bronchitis   . History of colonic polyps   . History of tachycardia    CONTROLLED  WITH ATENOLOL  . Hyperlipidemia   . Hypertension   . Neuromuscular disorder (HCC)    fibromyalgia  . Osteoporosis   . Pelvic pain   . Personal history of radiation therapy   . S/P radiation therapy 11/12/12 - 12/05/12   right Breast  . Sepsis ([place]) 2014   from UTI   . Sinus headache   . Urgency of urination      Past Surgical History:  Procedure Laterality Date  . [place] STUDY N/A 04/03/2016   Procedure: [place] STUDY;  Surgeon: Manus Gunning, MD;  Location: [facility];  Service: Gastroenterology;  Laterality: N/A;  . BREAST LUMPECTOMY Right 10-11-2012   W/ SLN BX  . CARDIAC CATHETERIZATION  09-13-2007  DR Lia Foyer   WELL-PRESERVED LVF/  DIFFUSE SCATTERED CORONARY CALCIFACATION AND ATHEROSCLEROSIS WITHOUT OBSTRUCTION  . CARDIAC CATHETERIZATION  08-04-2010  DR Angelena Form   NON-OBSTRUCTIVE CAD/  pLAD 40%/  oLAD 30%/  mLAD 30%/  pRCA 30%/  EF 60%  . CARDIOVASCULAR STRESS TEST  06-18-2012  DR McALHANY   LOW RISK NUCLEAR STUDY/  SMALL FIXED AREA OF MODERATELY DECREASED UPTAKE IN ANTEROSEPTAL WALL WHICH MAY BE ARTIFACTUAL/  NO ISCHEMIA/  EF 68%  . COLONOSCOPY  09-29-2010  . CYSTOSCOPY    . CYSTOSCOPY WITH HYDRODISTENSION AND BIOPSY N/A 03/06/2014   Procedure: CYSTOSCOPY/HYDRODISTENSION/ INSTILATION OF MARCAINE AND PYRIDIUM;  Surgeon: Ailene Rud, MD;  [facility];  Service: Urology;  Laterality: N/A;  . ESOPHAGEAL MANOMETRY N/A 04/03/2016  Procedure: ESOPHAGEAL MANOMETRY (EM);  Surgeon: Manus Gunning, MD;  [facility];  Service: Gastroenterology;  Laterality: N/A;  . NASAL SINUS SURGERY  1985  . ORIF RIGHT ANKLE  FX  2006  . POLYPECTOMY    . REMOVAL VOCAL CORD CYST  FEB 2014  . RIGHT BREAST BX  08-23-2012  . RIGHT HAND SURGERY  X3  LAST ONE 2009   INCLUDES  ORIF RIGHT 5TH FINGER AND REVISION TWICE  . TONSILLECTOMY AND ADENOIDECTOMY  AGE 4  . TOTAL ABDOMINAL HYSTERECTOMY W/ BILATERAL SALPINGOOPHORECTOMY  1982   W/  APPENDECTOMY  . TRANSTHORACIC ECHOCARDIOGRAM  06-24-2012   GRADE I DIASTOLIC DYSFUNCTION/  EF 55-60%/  MILD MR  . UPPER GASTROINTESTINAL ENDOSCOPY      There were no vitals filed for this visit.  Subjective Assessment - 03/14/18 1150    Subjective  Chiante reports she is doing so well , slight pain in the Rt sided  neck.          [facility] PT Assessment - 03/14/18 0001      Assessment   Medical Diagnosis  Musculoskeletal neck pain      Observation/Other Assessments   Focus on Therapeutic Outcomes (FOTO)   37% limited                  [facility] Adult PT Treatment/Exercise - 03/14/18 0001      Self-Care   Other Self-Care Comments   reviewed ther ex at the gym per handout      Exercises   Exercises  Lumbar      Lumbar Exercises: Stretches   Other Lumbar Stretch Exercise  good morning stretch,then gentle stretch over noodle, black bolster too much      Lumbar Exercises: Supine   Other Supine Lumbar Exercises  on large noodle marching then dead bug      Lumbar Exercises: Quadruped   Opposite Arm/Leg Raise  Left arm/Right leg;Right arm/Left leg;10 reps very difficult - attempted single leg/arm also and on elbows                  PT Long Term Goals - 03/11/18 1155      PT LONG TERM GOAL #1   Title  independent with HEP per her previous level  (04/08/18)    Status  On-going      PT LONG TERM GOAL #2   Title  perform cervical ROM without pain and WFL ( 04/08/18)       PT LONG TERM GOAL #3   Title  report 75% improvement in symtoms and ability to eat her her previous level (04/08/18)    Status  Achieved      PT LONG TERM GOAL #4   Title  improve FOTO =/< 48% limited ( 04/08/18)     Status  achieved, 37% limited      PT LONG TERM GOAL #5   Title  demo upper back strength =/> 4+/5 (04/08/18)      Status  On-going            Plan - 03/14/18 1235    Clinical Impression Statement  Shaquela is doing well, will have one more visit next week and then place on hold for 2 weeks if still doing well.     Rehab Potential  Excellent    PT Frequency  1x / week    PT Duration  6 weeks    PT Treatment/Interventions  Dry needling;Manual techniques;Moist Heat;Therapeutic activities;Patient/family education;Taping;Therapeutic exercise;Ultrasound;Cryotherapy;Dealer  Stimulation;Passive range of motion    PT Next Visit Plan  re-asess tolerance to returning to gym, if ok place on hold.     Consulted and Agree with Plan of Care  Patient       Patient will benefit from skilled therapeutic intervention in order to improve the following deficits and impairments:  Pain, Increased muscle spasms, Postural dysfunction, Impaired UE functional use, Decreased strength, Decreased range of motion  Visit Diagnosis: Muscle weakness (generalized)  Cervicalgia  Other muscle spasm  Dizziness and giddiness     Problem List Patient Active Problem List   Diagnosis Date Noted  . Excessive postexertional fatigue 04/02/2017  . Pneumonia due to virus 04/02/2017  . Chronic interstitial cystitis 02/01/2017  . Acute pharyngitis 01/02/2017  . Dysphagia   . Esophageal reflux   . Allergic rhinitis 01/25/2016  . Shortness of breath   . Left hip pain 04/30/2015  . Sciatica 04/30/2015  . Knee pain, right 04/30/2015  . Leukocytes in urine 04/30/2015  . Hyperlipidemia 03/01/2015  . Bilateral hand numbness 03/01/2015  . Pulmonary nodules 02/12/2015  . External hemorrhoids 02/05/2015  . Chronic night sweats 01/08/2015  . Atypical chest pain 01/01/2015  . Chest pain 01/01/2015  . Renal insufficiency 07/16/2014  . Sepsis secondary to UTI ([place]) 02/05/2014  . Grief reaction 12/29/2013  . Urinary hesitancy 12/04/2013  . Abdominal pain, other specified site 10/26/2013  . Headache(784.0) 10/13/2013  . Pain, joint, multiple sites 08/18/2013  . Vasculitis ([place]) 07/29/2013  . ANA positive 07/29/2013  . Other and unspecified hyperlipidemia 07/12/2013  . Contusion, chest wall 04/30/2013  . Malignant neoplasm of lower-outer quadrant of right breast of female, estrogen receptor positive ([place]) 01/03/2013  . Splenic lesion 09/02/2012  . Asthma, allergic 08/05/2012  . GERD (gastroesophageal reflux disease) 06/03/2012  . Former heavy tobacco smoker 06/03/2012  . Edema  04/08/2012  . Stricture and stenosis of esophagus 10/19/2011  . Constipation - functional 07/21/2011  . Nonspecific (abnormal) findings on radiological and other examination of biliary tract 07/21/2011  . Osteoporosis 04/17/2011  . POSTMENOPAUSAL STATUS 01/27/2011  . FIBROCYSTIC BREAST DISEASE, HX OF 10/18/2010  . Essential hypertension 08/25/2010  . CAD in native artery 08/25/2010  . TOBACCO ABUSE, HX OF 08/25/2010  . GENERALIZED ANXIETY DISORDER 08/03/2010  . FIBROMYALGIA 01/11/2010  . SKIN CANCER, HX OF 01/11/2010    Jeral Pinch PT  03/14/2018, 12:36 PM  [facility] [place], [place], [address] Phone: [phone]   Fax:  [phone]  Name: Teona Vargus MRN: [id number] Date of Birth: 05/28/1946

## 2018-03-19 ENCOUNTER — Telehealth: Payer: Self-pay | Admitting: *Deleted

## 2018-03-19 NOTE — Telephone Encounter (Addendum)
Deductible Amount Met  OOP MAX $5900 (158 met)  Annual exam 11/01/2017  Calcium 8.9   Date 12/20/18  Upcoming dental procedures NO   Prior Authorization needed NO  Pt estimated Cost $215  Appt 05/02/18      Coverage Details: 20% of one dose of Prolia

## 2018-03-21 ENCOUNTER — Encounter: Payer: Self-pay | Admitting: Physical Therapy

## 2018-03-21 ENCOUNTER — Ambulatory Visit (INDEPENDENT_AMBULATORY_CARE_PROVIDER_SITE_OTHER): Payer: Medicare Other | Admitting: Physical Therapy

## 2018-03-21 DIAGNOSIS — M62838 Other muscle spasm: Secondary | ICD-10-CM | POA: Diagnosis not present

## 2018-03-21 DIAGNOSIS — M542 Cervicalgia: Secondary | ICD-10-CM | POA: Diagnosis not present

## 2018-03-21 DIAGNOSIS — M6281 Muscle weakness (generalized): Secondary | ICD-10-CM | POA: Diagnosis not present

## 2018-03-21 NOTE — Therapy (Signed)
[place], [place], [address] Phone: [phone]   Fax:  [phone]  Physical Therapy Treatment  Patient Details  Name: [name] MRN: [id number] Date of Birth: [birth year] Referring Provider: Dr Sarina Ill   Encounter Date: 03/21/2018  PT End of Session - 03/21/18 1453    Visit Number  6    Date for PT Re-Evaluation  04/08/18    PT Start Time  1453    PT Stop Time  1546    PT Time Calculation (min)  53 min    Activity Tolerance  Patient tolerated treatment well       Past Medical History:  Diagnosis Date  . Allergy   . Anemia   . Anxiety    past hx   . Arthritis   . Asthma   . Atrial flutter ([place])    past history- not current  . CAD (coronary artery disease) CARDIOLOGIST--  DR Angelena Form   mild non-obstructive cad  . Cancer ([place])    right  . Cataract    bilaterally removed   . Chronic constipation   . Chronic kidney disease    interstitial cystitis  . COPD (chronic obstructive pulmonary disease) ([place])   . Depression    past hx   . Family history of malignant hyperthermia    father had this  . Fibromyalgia   . Frequency of urination   . GERD (gastroesophageal reflux disease)   . H/O hiatal hernia   . History of basal cell carcinoma excision    X2  . History of breast cancer ONCOLOGIST-- DR Jana Hakim---  NO RECURRANCE   DX 07/2012;  LOW GRADE DCIS  ER+PR+  ----  S/P RIGHT LUMPECTOMY WITH NEGATIVE MARGINS/   RADIATION ENDED 11/2012  . History of chronic bronchitis   . History of colonic polyps   . History of tachycardia    CONTROLLED  WITH ATENOLOL  . Hyperlipidemia   . Hypertension   . Neuromuscular disorder (HCC)    fibromyalgia  . Osteoporosis   . Pelvic pain   . Personal history of radiation therapy   . S/P radiation therapy 11/12/12 - 12/05/12   right Breast  . Sepsis ([place]) 2014   from UTI   . Sinus headache   . Urgency of urination     Past Surgical History:   Procedure Laterality Date  . [place] STUDY N/A 04/03/2016   Procedure: [place] STUDY;  Surgeon: Manus Gunning, MD;  Location: [facility];  Service: Gastroenterology;  Laterality: N/A;  . BREAST LUMPECTOMY Right 10-11-2012   W/ SLN BX  . CARDIAC CATHETERIZATION  09-13-2007  DR Lia Foyer   WELL-PRESERVED LVF/  DIFFUSE SCATTERED CORONARY CALCIFACATION AND ATHEROSCLEROSIS WITHOUT OBSTRUCTION  . CARDIAC CATHETERIZATION  08-04-2010  DR Angelena Form   NON-OBSTRUCTIVE CAD/  pLAD 40%/  oLAD 30%/  mLAD 30%/  pRCA 30%/  EF 60%  . CARDIOVASCULAR STRESS TEST  06-18-2012  DR McALHANY   LOW RISK NUCLEAR STUDY/  SMALL FIXED AREA OF MODERATELY DECREASED UPTAKE IN ANTEROSEPTAL WALL WHICH MAY BE ARTIFACTUAL/  NO ISCHEMIA/  EF 68%  . COLONOSCOPY  09-29-2010  . CYSTOSCOPY    . CYSTOSCOPY WITH HYDRODISTENSION AND BIOPSY N/A 03/06/2014   Procedure: CYSTOSCOPY/HYDRODISTENSION/ INSTILATION OF MARCAINE AND PYRIDIUM;  Surgeon: Ailene Rud, MD;  Location: [facility];  Service: Urology;  Laterality: N/A;  . ESOPHAGEAL MANOMETRY N/A 04/03/2016   Procedure: ESOPHAGEAL MANOMETRY (EM);  Surgeon: Remo Lipps  Gaye Alken, MD;  Location: [facility];  Service: Gastroenterology;  Laterality: N/A;  . NASAL SINUS SURGERY  1985  . ORIF RIGHT ANKLE  FX  2006  . POLYPECTOMY    . REMOVAL VOCAL CORD CYST  FEB 2014  . RIGHT BREAST BX  08-23-2012  . RIGHT HAND SURGERY  X3  LAST ONE 2009   INCLUDES  ORIF RIGHT 5TH FINGER AND REVISION TWICE  . TONSILLECTOMY AND ADENOIDECTOMY  AGE 63  . TOTAL ABDOMINAL HYSTERECTOMY W/ BILATERAL SALPINGOOPHORECTOMY  1982   W/  APPENDECTOMY  . TRANSTHORACIC ECHOCARDIOGRAM  06-24-2012   GRADE I DIASTOLIC DYSFUNCTION/  EF 55-60%/  MILD MR  . UPPER GASTROINTESTINAL ENDOSCOPY      There were no vitals filed for this visit.  Subjective Assessment - 03/21/18 1454    Subjective  [name] reports she is having something pull around her waist Rt side. Neck is still doing  great.     Patient Stated Goals  be back in "shape" for her wedding anniversary trip in June. Get back into her exercise.     Currently in Pain?  Yes    Pain Score  6  when it hurts - weightbearing    Pain Location  Buttocks    Pain Orientation  Right    Pain Descriptors / Indicators  Sharp    Pain Type  Acute pain    Pain Onset  In the past 7 days    Pain Frequency  Constant    Aggravating Factors   after she rode the bike at the gym this pain began    Pain Relieving Factors  nothing yet         [facility] PT Assessment - 03/21/18 0001      Assessment   Medical Diagnosis  Musculoskeletal neck pain    Referring Provider  Dr Sarina Ill      Observation/Other Assessments   Focus on Therapeutic Outcomes (FOTO)   36% limited      Posture/Postural Control   Posture Comments  Rt ilium higher is supine      AROM   Cervical Flexion  WNl to chest    Cervical Extension  WNL    Cervical - Right Side Bend  34    Cervical - Left Side Bend  32    Cervical - Right Rotation  81    Cervical - Left Rotation  73      Strength   Overall Strength Comments  mid back Rt 4+/5, Lt 4/5      Palpation   Palpation comment  tight and tender in Rt gluts, piriformis and low back            No data recorded       [facility] Adult PT Treatment/Exercise - 03/21/18 0001      Self-Care   Other Self-Care Comments   TPR to gluts and low back on wall with ball      Lumbar Exercises: Aerobic   Nustep  arms/legs L5x5'      Modalities   Modalities  Electrical Stimulation;Moist Heat      Moist Heat Therapy   Number Minutes Moist Heat  15 Minutes    Moist Heat Location  Lumbar Spine buttocks      Electrical Stimulation   Electrical Stimulation Location  Rt low back and buttocks    Electrical Stimulation Action  IFC    Electrical Stimulation Parameters  to tolerance    Electrical Stimulation Goals  Pain;Tone  Manual Therapy   Manual Therapy  Soft tissue mobilization;Myofascial  release;Muscle Energy Technique    Soft tissue mobilization  STM to Rt gluts, piriformis andlow back with TPR deep pressure along iliac crest    Muscle Energy Technique  to correct elevated Rt ilum                  PT Long Term Goals - 03/21/18 1518      PT LONG TERM GOAL #1   Title  independent with HEP per her previous level  (04/08/18)    Status  Achieved      PT LONG TERM GOAL #2   Title  perform cervical ROM without pain and WFL ( 04/08/18)     Status  Achieved      PT LONG TERM GOAL #3   Title  report 75% improvement in symtoms and ability to eat her her previous level (04/08/18)    Status  Achieved      PT LONG TERM GOAL #4   Title  improve FOTO =/< 48% limited ( 04/08/18)     Status  Achieved 36% limited      PT LONG TERM GOAL #5   Title  demo upper back strength =/> 4+/5 (04/08/18)      Status  Partially Met            Plan - 03/21/18 1531    Clinical Impression Statement  [name] has progresses great with her neck.  She has met all but one part of a goal.  She still has a little bit of weakness in her upper back.  She is ready for discharge to her HEP.  She developed new pain in her Rt buttocks/low back.  This was looked at today, has some elevation of the Rt ilium. Muscle energy technique was performed to correct along with STW to decrease muscular tightness, pain better at end of session.     PT Next Visit Plan  discharge to HEP - if SIJ/low back pain continues she will need an order from this.     Consulted and Agree with Plan of Care  Patient       Patient will benefit from skilled therapeutic intervention in order to improve the following deficits and impairments:     Visit Diagnosis: Muscle weakness (generalized)  Cervicalgia  Other muscle spasm     Problem List Patient Active Problem List   Diagnosis Date Noted  . Excessive postexertional fatigue 04/02/2017  . Pneumonia due to virus 04/02/2017  . Chronic interstitial cystitis 02/01/2017   . Acute pharyngitis 01/02/2017  . Dysphagia   . Esophageal reflux   . Allergic rhinitis 01/25/2016  . Shortness of breath   . Left hip pain 04/30/2015  . Sciatica 04/30/2015  . Knee pain, right 04/30/2015  . Leukocytes in urine 04/30/2015  . Hyperlipidemia 03/01/2015  . Bilateral hand numbness 03/01/2015  . Pulmonary nodules 02/12/2015  . External hemorrhoids 02/05/2015  . Chronic night sweats 01/08/2015  . Atypical chest pain 01/01/2015  . Chest pain 01/01/2015  . Renal insufficiency 07/16/2014  . Sepsis secondary to UTI ([place]) 02/05/2014  . Grief reaction 12/29/2013  . Urinary hesitancy 12/04/2013  . Abdominal pain, other specified site 10/26/2013  . Headache(784.0) 10/13/2013  . Pain, joint, multiple sites 08/18/2013  . Vasculitis ([place]) 07/29/2013  . ANA positive 07/29/2013  . Other and unspecified hyperlipidemia 07/12/2013  . Contusion, chest wall 04/30/2013  . Malignant neoplasm of lower-outer quadrant of right breast of female,  estrogen receptor positive ([place]) 01/03/2013  . Splenic lesion 09/02/2012  . Asthma, allergic 08/05/2012  . GERD (gastroesophageal reflux disease) 06/03/2012  . Former heavy tobacco smoker 06/03/2012  . Edema 04/08/2012  . Stricture and stenosis of esophagus 10/19/2011  . Constipation - functional 07/21/2011  . Nonspecific (abnormal) findings on radiological and other examination of biliary tract 07/21/2011  . Osteoporosis 04/17/2011  . POSTMENOPAUSAL STATUS 01/27/2011  . FIBROCYSTIC BREAST DISEASE, HX OF 10/18/2010  . Essential hypertension 08/25/2010  . CAD in native artery 08/25/2010  . TOBACCO ABUSE, HX OF 08/25/2010  . GENERALIZED ANXIETY DISORDER 08/03/2010  . FIBROMYALGIA 01/11/2010  . SKIN CANCER, HX OF 01/11/2010    Doy Taaffe,SUE 03/21/2018, 3:34 PM  [facility] [place], [place], [address] Phone: [phone]   Fax:  [phone]  Name: Jameriah Trotti MRN:  [id number] Date of Birth: [birth year]   PHYSICAL THERAPY DISCHARGE SUMMARY  Visits from Start of Care: 6  Current functional level related to goals / functional outcomes: See above   Remaining deficits: None as relates to her neck   Education / Equipment: HEP Plan: Patient agrees to discharge.  Patient goals were met. Patient is being discharged due to being pleased with the current functional level.  ?????    Jeral Pinch, PT 03/21/18 3:35 PM

## 2018-03-26 ENCOUNTER — Encounter: Payer: Self-pay | Admitting: Gynecology

## 2018-03-26 ENCOUNTER — Ambulatory Visit: Payer: Medicare Other | Admitting: Gynecology

## 2018-03-26 VITALS — BP 124/80

## 2018-03-26 DIAGNOSIS — N898 Other specified noninflammatory disorders of vagina: Secondary | ICD-10-CM | POA: Diagnosis not present

## 2018-03-26 LAB — WET PREP FOR TRICH, YEAST, CLUE

## 2018-03-26 NOTE — Progress Notes (Signed)
    [name] Aug 02, 1946 [id number]        71 y.o.  G3P3 presents in follow-up of her vaginal discharge.  She has a history of chronic vaginal discharge that worsens at times described as yellow to darkish.  Has had a question as to whether there is a intestinal vaginal fistula and she is undergone evaluation for this through GI to include lower GI studies and fistulogram all of which have been negative.  She has no odor, itching or irritation associated with this.  She wanted to be checked before reinitiating intercourse with her husband for fear of giving him an infection.  Status post TAH/BSO in the past for endometriosis.  Past medical history,surgical history, problem list, medications, allergies, family history and social history were all reviewed and documented in the EPIC chart.  Directed ROS with pertinent positives and negatives documented in the history of present illness/assessment and plan.  Exam: [name] assistant Vitals:   03/26/18 1009  BP: 124/80   General appearance:  Normal Abdomen soft nontender without masses guarding rebound Pelvic external BUS vagina with atrophic changes.  Slight white discharge noted.  No drainage or other abnormalities noted.  Bimanual exam without masses or tenderness.  Rectal exam is normal.  Assessment/Plan:  72 y.o. G3P3 with chronic discharge which seems to worsen intermittently.  No associated symptoms such as irritation, itching or odor.  Had workup for fistula which was negative.  We discussed that it can be difficult sometimes locating small intermittent fistulas.  Options such as tampons with methylene blue enemas as further workup discussed.  What she would do if indeed she had a small fistula which she have surgery to repair and she would not at this point as it is not overly bothersome to her.  Her main concern is whether she would be exposing her husband to an infection.  Wet prep today is negative.  She does use intermittent douches  every several weeks.  At this point I see nothing that we can treat and that she is not interested in doing anything from a reparative standpoint I do not think any further more aggressive workup for fistula needed.  Options for her husband to include wearing a condom or having unprotected intercourse and excepting a possible risk of UTI discussed.  Patient is satisfied with this solution at this point.  She will follow-up at the end of this year for annual exam.  We will plan on bone density beginning of next year at a 2-year interval as she is being treated with Prolia for her osteoporosis.  Greater than 50% of my 15 minute visit was spent in direct face to face counseling and coordination of care with the patient.     Sheri Auerbach MD, 10:39 AM 03/26/2018

## 2018-03-26 NOTE — Patient Instructions (Signed)
Follow-up end of this year for annual exam

## 2018-03-27 ENCOUNTER — Other Ambulatory Visit: Payer: Self-pay | Admitting: Cardiology

## 2018-03-27 MED ORDER — EVOLOCUMAB 140 MG/ML ~~LOC~~ SOAJ: 140.0000 mg | SUBCUTANEOUS | 12 refills | Status: DC

## 2018-03-27 NOTE — Telephone Encounter (Signed)
New Message     *STAT* If patient is at the pharmacy, call can be transferred to refill team.   1. Which medications need to be refilled? (please list name of each medication and dose if known)  Evolocumab (REPATHA SURECLICK) 794 MG/ML SOAJ   2. Which pharmacy/location (including street and city if local pharmacy) is medication to be sent to? [facility] Fax number  [phone]   3. Do they need a 30 day or 90 day supply? 30 day     Per Olivia Mackie with [employer] is calling in reference to a defective batch that was sent to the patient. They indicate that they are replacing the one batch free of charge to the patient but the need a new Rx sent in order to do so. Please either fax or electronically send the Rx,

## 2018-03-29 ENCOUNTER — Other Ambulatory Visit: Payer: Self-pay | Admitting: Cardiology

## 2018-03-29 MED ORDER — EVOLOCUMAB 140 MG/ML ~~LOC~~ SOAJ: 140.0000 mg | SUBCUTANEOUS | 12 refills | Status: DC

## 2018-03-29 NOTE — Telephone Encounter (Signed)
New Message  Pt c/o medication issue:  1. Name of Medication:Evolocumab (REPATHA SURECLICK) 350 MG/ML SOAJ  2. How are you currently taking this medication (dosage and times per day)? Inject 140 mg into the skin every 14 (fourteen) days.  3. Are you having a reaction (difficulty breathing--STAT)? no  4. What is your medication issue? Linus Orn is calling because she needs a prescription faxed over for repatha, states she can fill it but needs the prescription Fax: [phone]

## 2018-04-01 ENCOUNTER — Other Ambulatory Visit: Payer: Self-pay | Admitting: Pharmacist Clinician (PhC)/ Clinical Pharmacy Specialist

## 2018-04-01 ENCOUNTER — Telehealth: Payer: Self-pay | Admitting: Pharmacist Clinician (PhC)/ Clinical Pharmacy Specialist

## 2018-04-01 MED ORDER — EVOLOCUMAB 140 MG/ML ~~LOC~~ SOAJ: 140.0000 mg | SUBCUTANEOUS | 12 refills | Status: DC

## 2018-04-01 NOTE — Telephone Encounter (Signed)
Patient had defective pen.  Rx sent to [facility] for replacement product

## 2018-04-04 DIAGNOSIS — M545 Low back pain: Secondary | ICD-10-CM | POA: Diagnosis not present

## 2018-04-04 DIAGNOSIS — I10 Essential (primary) hypertension: Secondary | ICD-10-CM | POA: Diagnosis not present

## 2018-04-07 DIAGNOSIS — Z87891 Personal history of nicotine dependence: Secondary | ICD-10-CM | POA: Diagnosis not present

## 2018-04-07 DIAGNOSIS — Z7951 Long term (current) use of inhaled steroids: Secondary | ICD-10-CM | POA: Diagnosis not present

## 2018-04-07 DIAGNOSIS — Z91040 Latex allergy status: Secondary | ICD-10-CM | POA: Diagnosis not present

## 2018-04-07 DIAGNOSIS — R21 Rash and other nonspecific skin eruption: Secondary | ICD-10-CM | POA: Diagnosis not present

## 2018-04-07 DIAGNOSIS — Z79899 Other long term (current) drug therapy: Secondary | ICD-10-CM | POA: Diagnosis not present

## 2018-04-07 DIAGNOSIS — Z883 Allergy status to other anti-infective agents status: Secondary | ICD-10-CM | POA: Diagnosis not present

## 2018-04-07 DIAGNOSIS — Z853 Personal history of malignant neoplasm of breast: Secondary | ICD-10-CM | POA: Diagnosis not present

## 2018-04-07 DIAGNOSIS — Z887 Allergy status to serum and vaccine status: Secondary | ICD-10-CM | POA: Diagnosis not present

## 2018-04-07 DIAGNOSIS — Z885 Allergy status to narcotic agent status: Secondary | ICD-10-CM | POA: Diagnosis not present

## 2018-04-07 DIAGNOSIS — T428X5A Adverse effect of antiparkinsonism drugs and other central muscle-tone depressants, initial encounter: Secondary | ICD-10-CM | POA: Diagnosis not present

## 2018-04-07 DIAGNOSIS — Z880 Allergy status to penicillin: Secondary | ICD-10-CM | POA: Diagnosis not present

## 2018-04-07 DIAGNOSIS — T7840XA Allergy, unspecified, initial encounter: Secondary | ICD-10-CM | POA: Diagnosis not present

## 2018-04-07 DIAGNOSIS — I10 Essential (primary) hypertension: Secondary | ICD-10-CM | POA: Diagnosis not present

## 2018-04-07 DIAGNOSIS — M797 Fibromyalgia: Secondary | ICD-10-CM | POA: Diagnosis not present

## 2018-04-08 DIAGNOSIS — M5442 Lumbago with sciatica, left side: Secondary | ICD-10-CM | POA: Diagnosis not present

## 2018-04-08 DIAGNOSIS — M4186 Other forms of scoliosis, lumbar region: Secondary | ICD-10-CM | POA: Diagnosis not present

## 2018-04-08 DIAGNOSIS — M5441 Lumbago with sciatica, right side: Secondary | ICD-10-CM | POA: Diagnosis not present

## 2018-04-08 DIAGNOSIS — M4726 Other spondylosis with radiculopathy, lumbar region: Secondary | ICD-10-CM | POA: Diagnosis not present

## 2018-04-08 DIAGNOSIS — I10 Essential (primary) hypertension: Secondary | ICD-10-CM | POA: Diagnosis not present

## 2018-04-09 DIAGNOSIS — M4186 Other forms of scoliosis, lumbar region: Secondary | ICD-10-CM | POA: Diagnosis not present

## 2018-04-09 DIAGNOSIS — M7061 Trochanteric bursitis, right hip: Secondary | ICD-10-CM | POA: Diagnosis not present

## 2018-04-09 DIAGNOSIS — M5136 Other intervertebral disc degeneration, lumbar region: Secondary | ICD-10-CM | POA: Diagnosis not present

## 2018-04-15 ENCOUNTER — Ambulatory Visit: Payer: Medicare Other | Admitting: Nurse Practitioner

## 2018-04-15 ENCOUNTER — Encounter: Payer: Self-pay | Admitting: Nurse Practitioner

## 2018-04-15 VITALS — BP 128/80 | HR 72 | Ht 64.0 in | Wt 128.8 lb

## 2018-04-15 DIAGNOSIS — K5900 Constipation, unspecified: Secondary | ICD-10-CM

## 2018-04-15 DIAGNOSIS — N823 Fistula of vagina to large intestine: Secondary | ICD-10-CM

## 2018-04-15 NOTE — Progress Notes (Signed)
Chief Complaint: passing gas and stool through vagina.     ASSESSMENT AND PLAN;    1. 72 yo female back with persistent sx of a rectovaginal fistula. Continues to pass air through vagina and also leaks fecal contents out of vagina, especially when constipated. Pelvic MRI couldn't exclude a rectovaginal fistula but one not appreciated on flexible sigmoidoscopy. Surgery evaluated her, ordered a water soluble contrast enema which was negative for fistula.  -Small fistulas can be difficult to appreciate. We discussed a rather primitive at home test that she could try at home. She will insert a tampon then administer a tap water enema with a few drops of blue coloring into the rectum taking great care not to contaminate vaginal area with the dye colored water. After administration of enema she will remove tampon and assess for any blue discoloration. Patient plans to try this and let me know. I think that we may send her for a second surgical opinion soon - maybe with Kem Parkinson, MD.   2. Constipation, chronic.  -she will trying increasing Miralax to twice daily  3. Weight gain of 12-15 pounds over the last year. She cannot correlate this with diet / activity / medication changes.  TSH in Sept was normal. She feels that all of the weight is concentrated in her abdomen. No abdominal abnormalities on exam today. CT scan in March 2018 (around time she started to gain weight) was unrevealing.    HPI:    Patient is a 72 yo female known to Dr. Havery Moros. She was evaluated close to a year ago for questionable rectovaginal fistula.  Flexible sigmoidoscopy failed to demonstrate a fistula.  Subsequent MRI of the pelvis could not exclude a small rectovaginal fistula however.  Referred for surgical evaluation, saw Dr. Lucia Gaskins. She went for a water soluble contrast enema which was negative for fistula. She notices increased fecal drainage through vagina most often when constipated. She passes gas through  vagina at time. Thankfully not UTIs lately  Michal has gained 12-15 pounds over the last year and is very stressed about it.  All of the weight is in her abdomen. She never had to watch weight before. No changes in diet / activity / medications. Thyroid studies last done in Sept 2018. TSH was normal. CT scan March 2018 negative for ascites, tumors or other abnormalities and she was already starting to gain weight at that time.She takes a 20 mg of lasix almost daily. She is compliant with GYN checkups. Her next GYN follow up is in May.   Priyal has chronic constipation. She takes daily MiraLAX but can still go several days without a bowel movement.  Past Medical History:  Diagnosis Date  . Allergy   . Anemia   . Anxiety    past hx   . Arthritis   . Asthma   . Atrial flutter ([place])    past history- not current  . CAD (coronary artery disease) CARDIOLOGIST--  DR Angelena Form   mild non-obstructive cad  . Cancer ([place])    right  . Cataract    bilaterally removed   . Chronic constipation   . Chronic kidney disease    interstitial cystitis  . COPD (chronic obstructive pulmonary disease) ([place])   . Depression    past hx   . Family history of malignant hyperthermia    father had this  . Fibromyalgia   . Frequency of urination   . GERD (gastroesophageal reflux disease)   .  H/O hiatal hernia   . History of basal cell carcinoma excision    X2  . History of breast cancer ONCOLOGIST-- DR Jana Hakim---  NO RECURRANCE   DX 07/2012;  LOW GRADE DCIS  ER+PR+  ----  S/P RIGHT LUMPECTOMY WITH NEGATIVE MARGINS/   RADIATION ENDED 11/2012  . History of chronic bronchitis   . History of colonic polyps   . History of tachycardia    CONTROLLED  WITH ATENOLOL  . Hyperlipidemia   . Hypertension   . Neuromuscular disorder ([facility])    fibromyalgia  . Osteoporosis   . Pelvic pain   . Personal history of radiation therapy   . S/P radiation therapy 11/12/12 - 12/05/12   right Breast  . Sepsis ([place]) 2014   from  UTI   . Sinus headache   . Urgency of urination      Past Surgical History:  Procedure Laterality Date  . [place] STUDY N/A 04/03/2016   Procedure: [place] STUDY;  Surgeon: Manus Gunning, MD;  Location: [facility];  Service: Gastroenterology;  Laterality: N/A;  . BREAST LUMPECTOMY Right 10-11-2012   W/ SLN BX  . CARDIAC CATHETERIZATION  09-13-2007  DR Lia Foyer   WELL-PRESERVED LVF/  DIFFUSE SCATTERED CORONARY CALCIFACATION AND ATHEROSCLEROSIS WITHOUT OBSTRUCTION  . CARDIAC CATHETERIZATION  08-04-2010  DR Angelena Form   NON-OBSTRUCTIVE CAD/  pLAD 40%/  oLAD 30%/  mLAD 30%/  pRCA 30%/  EF 60%  . CARDIOVASCULAR STRESS TEST  06-18-2012  DR McALHANY   LOW RISK NUCLEAR STUDY/  SMALL FIXED AREA OF MODERATELY DECREASED UPTAKE IN ANTEROSEPTAL WALL WHICH MAY BE ARTIFACTUAL/  NO ISCHEMIA/  EF 68%  . COLONOSCOPY  09-29-2010  . CYSTOSCOPY    . CYSTOSCOPY WITH HYDRODISTENSION AND BIOPSY N/A 03/06/2014   Procedure: CYSTOSCOPY/HYDRODISTENSION/ INSTILATION OF MARCAINE AND PYRIDIUM;  Surgeon: Ailene Rud, MD;  [facility];  Service: Urology;  Laterality: N/A;  . ESOPHAGEAL MANOMETRY N/A 04/03/2016   Procedure: ESOPHAGEAL MANOMETRY (EM);  Surgeon: Manus Gunning, MD;  Location: [facility];  Service: Gastroenterology;  Laterality: N/A;  . NASAL SINUS SURGERY  1985  . ORIF RIGHT ANKLE  FX  2006  . POLYPECTOMY    . REMOVAL VOCAL CORD CYST  FEB 2014  . RIGHT BREAST BX  08-23-2012  . RIGHT HAND SURGERY  X3  LAST ONE 2009   INCLUDES  ORIF RIGHT 5TH FINGER AND REVISION TWICE  . TONSILLECTOMY AND ADENOIDECTOMY  AGE 1  . TOTAL ABDOMINAL HYSTERECTOMY W/ BILATERAL SALPINGOOPHORECTOMY  1982   W/  APPENDECTOMY  . TRANSTHORACIC ECHOCARDIOGRAM  06-24-2012   GRADE I DIASTOLIC DYSFUNCTION/  EF 55-60%/  MILD MR  . UPPER GASTROINTESTINAL ENDOSCOPY     Family History  Problem Relation Age of Onset  . Rectal cancer Mother   . Colon cancer Mother   . Breast  cancer Mother   . Pancreatic cancer Mother   . Breast cancer Maternal Aunt        breast  . Irritable bowel syndrome Son   . Irritable bowel syndrome Daughter   . Colon cancer Father   . Colon polyps Father   . Breast cancer Paternal Aunt   . Breast cancer Paternal Aunt   . Esophageal cancer Neg Hx   . Stomach cancer Neg Hx    Social History   Tobacco Use  . Smoking status: Former Smoker    Packs/day: 1.00    Years: 15.00    Pack years: 15.00  Types: Cigarettes    Last attempt to quit: 05/27/2005    Years since quitting: 12.8  . Smokeless tobacco: Never Used  Substance Use Topics  . Alcohol use: No    Alcohol/week: 0.0 oz  . Drug use: No   Current Outpatient Medications  Medication Sig Dispense Refill  . acetaminophen (TYLENOL) 500 MG tablet Take 1,000 mg by mouth every 6 (six) hours as needed for mild pain or headache.     . albuterol (PROVENTIL HFA;VENTOLIN HFA) 108 (90 BASE) MCG/ACT inhaler Inhale 2 puffs into the lungs every 4 (four) hours as needed. 1 Inhaler 5  . amLODipine (NORVASC) 5 MG tablet Take 1 tablet (5 mg total) by mouth daily. (Patient taking differently: Take 5 mg by mouth every evening. ) 90 tablet 3  . aspirin EC 81 MG tablet Take 1 tablet (81 mg total) by mouth daily. 90 tablet 3  . atenolol (TENORMIN) 25 MG tablet Take 1 tablet (25 mg total) by mouth 3 (three) times daily before meals. (Patient taking differently: Take 25 mg by mouth 2 (two) times daily. ) 90 tablet 11  . DULoxetine (CYMBALTA) 60 MG capsule TAKE 1 BY MOUTH TWICE DAILY 180 capsule 1  . Estradiol 10 MCG TABS vaginal tablet Place 1 tablet (10 mcg total) 2 (two) times a week vaginally. 8 tablet 11  . Evolocumab (REPATHA SURECLICK) 409 MG/ML SOAJ Inject 140 mg into the skin every 14 (fourteen) days. 1 pen 12  . fluticasone (FLONASE) 50 MCG/ACT nasal spray Place 1 spray into the nose as needed for allergies.     . furosemide (LASIX) 20 MG tablet Take 1 tablet (20 mg total) by mouth every  morning. 180 tablet 1  . gabapentin (NEURONTIN) 300 MG capsule Take 1 capsule (300 mg total) by mouth 2 (two) times daily. 60 capsule 6  . loratadine (CLARITIN) 10 MG tablet Take 1 tablet (10 mg total) by mouth daily. 30 tablet 11  . losartan (COZAAR) 100 MG tablet Take 1 tablet (100 mg total) by mouth daily. 90 tablet 3  . omeprazole (PRILOSEC) 40 MG capsule Take 40 mg by mouth 2 (two) times daily.    . pentosan polysulfate (ELMIRON) 100 MG capsule Take 1 capsule (100 mg total) by mouth 3 (three) times daily. Reported on 01/05/2016 (Patient taking differently: Take 100 mg by mouth 3 (three) times daily with meals as needed. Reported on 01/05/2016) 90 capsule 11  . traMADol (ULTRAM) 50 MG tablet Take 1-2 tablets (50-100 mg total) by mouth 3 (three) times daily as needed for moderate pain. (Patient taking differently: Take 50-100 mg by mouth 3 (three) times daily as needed for moderate pain (pt uses TID). ) 180 tablet 0  . traZODone (DESYREL) 100 MG tablet Take 1 tablet (100 mg total) by mouth at bedtime. 180 tablet 1   Current Facility-Administered Medications  Medication Dose Route Frequency Provider Last Rate Last Dose  . 0.9 %  sodium chloride infusion  500 mL Intravenous Continuous Armbruster, Carlota Raspberry, MD      . 0.9 %  sodium chloride infusion  500 mL Intravenous Continuous Armbruster, Carlota Raspberry, MD       Facility-Administered Medications Ordered in Other Visits  Medication Dose Route Frequency Provider Last Rate Last Dose  . bupivacaine (MARCAINE) 0.5 % 10 mL, triamcinolone acetonide (KENALOG-40) 40 mg injection   Subcutaneous Once Carolan Clines, MD      . bupivacaine (MARCAINE) 0.5 % 15 mL, phenazopyridine (PYRIDIUM) 400 mg bladder mixture  Bladder Instillation Once Carolan Clines, MD       Allergies  Allergen Reactions  . Clindamycin Hcl Shortness Of Breath and Rash  . Penicillins Anaphylaxis  . Prednisone Shortness Of Breath and Rash  . Codeine Hives and Other (See Comments)     headache  . Fluzone [Flu Virus Vaccine] Other (See Comments)    Local reaction at the site  . Latex Hives  . Pentazocine Lactate Other (See Comments)    HALLUCINATION  . Pneumococcal Vaccine Polyvalent Hives, Swelling and Other (See Comments)    REACTION: redness, swelling, and hives at injection site  . Tamoxifen Nausea And Vomiting and Other (See Comments)    HEADACHE     Review of Systems: All systems reviewed and negative except where noted in HPI.     Physical Exam:    Wt Readings from Last 3 Encounters:  04/15/18 128 lb 12.8 oz (58.4 kg)  01/28/18 126 lb 9.6 oz (57.4 kg)  12/24/17 124 lb 12.8 oz (56.6 kg)    Ht 5\' 4"  (1.626 m)   Wt 128 lb 12.8 oz (58.4 kg)   BMI 22.11 kg/m  Constitutional:  Pleasant well-developed, white female in no acute distress. Psychiatric: Normal mood and affect. Behavior is normal. EENT: Pupils normal.  Conjunctivae are normal. No scleral icterus. Neck supple.  Cardiovascular: Normal rate, regular rhythm. No edema Pulmonary/chest: Effort normal and breath sounds normal. No wheezing, rales or rhonchi. Abdominal: Soft, nondistended. Nontender. Bowel sounds active throughout. There are no masses palpable. No hepatomegaly. Neurological: Alert and oriented to person place and time. Skin: Skin is warm and dry. No rashes noted.  Tye Savoy, NP  04/15/2018, 10:44 AM

## 2018-04-15 NOTE — Patient Instructions (Signed)
If you are age 73 or older, your body mass index should be between 23-30. Your Body mass index is 22.11 kg/m. If this is out of the aforementioned range listed, please consider follow up with your Primary Care Provider.  If you are age 47 or younger, your body mass index should be between 19-25. Your Body mass index is 22.11 kg/m. If this is out of the aformentioned range listed, please consider follow up with your Primary Care Provider.   Increase Miralax to twice daily. [name] will call you about a fistula study.

## 2018-04-16 ENCOUNTER — Encounter: Payer: Self-pay | Admitting: Nurse Practitioner

## 2018-04-16 DIAGNOSIS — M5416 Radiculopathy, lumbar region: Secondary | ICD-10-CM | POA: Diagnosis not present

## 2018-04-17 ENCOUNTER — Telehealth: Payer: Self-pay

## 2018-04-17 NOTE — Telephone Encounter (Signed)
-----   Message from Willia Craze, NP sent at 04/16/2018  4:42 PM EDT ----- Eustaquio Maize, please find out if Vaughan Basta did the tampon test ( She will know what you are talking about). Also, is she interested in another opinion with a colorectal surgeon?  If so, please see if Kem Parkinson, MD would see her for evaluation of possible rectovaginal fistula.  I think Dr. Sheryn Bison is at [facility].

## 2018-04-17 NOTE — Telephone Encounter (Signed)
Spoke with Tye Savoy, NP. Patient is instructed to insert the tampon. Then use enema. Hold enema for 15 minutes or as long as she can up to 15 minutes. Before expelling the enema, she is to remove the tampon. Patient says she will do this in a couple of days. She has been taking Miralax and is having more frequent bowel movements.

## 2018-04-17 NOTE — Progress Notes (Signed)
Agree with assessment and plan as outlined. Clinically, with passing air and stool from the vagina, she has a rectovaginal fistula, we just have not been able to see it on endoscopy, MRI, and barium study. I think it reasonable to get a second surgical opinion from tertiary care facility such as with Dr. Sheryn Bison at [facility] as these symptoms are persisting.

## 2018-04-17 NOTE — Telephone Encounter (Signed)
I spoke with the patient. She has not done the tampon test. She was waiting for confirmation that this was what Dr [name] wanted her to do. She says sorry, she must have misunderstood. She does not know how long to leave the tampon. Please advise.

## 2018-04-19 DIAGNOSIS — I8311 Varicose veins of right lower extremity with inflammation: Secondary | ICD-10-CM | POA: Diagnosis not present

## 2018-04-19 DIAGNOSIS — L821 Other seborrheic keratosis: Secondary | ICD-10-CM | POA: Diagnosis not present

## 2018-04-19 DIAGNOSIS — L570 Actinic keratosis: Secondary | ICD-10-CM | POA: Diagnosis not present

## 2018-04-19 DIAGNOSIS — I8312 Varicose veins of left lower extremity with inflammation: Secondary | ICD-10-CM | POA: Diagnosis not present

## 2018-04-22 DIAGNOSIS — M4180 Other forms of scoliosis, site unspecified: Secondary | ICD-10-CM | POA: Diagnosis not present

## 2018-04-22 DIAGNOSIS — M5136 Other intervertebral disc degeneration, lumbar region: Secondary | ICD-10-CM | POA: Diagnosis not present

## 2018-04-22 DIAGNOSIS — M4150 Other secondary scoliosis, site unspecified: Secondary | ICD-10-CM

## 2018-04-22 DIAGNOSIS — M549 Dorsalgia, unspecified: Secondary | ICD-10-CM | POA: Diagnosis not present

## 2018-04-22 DIAGNOSIS — R3914 Feeling of incomplete bladder emptying: Secondary | ICD-10-CM | POA: Diagnosis not present

## 2018-04-22 DIAGNOSIS — N1330 Unspecified hydronephrosis: Secondary | ICD-10-CM | POA: Diagnosis not present

## 2018-04-22 HISTORY — DX: Other secondary scoliosis, site unspecified: M41.50

## 2018-04-24 ENCOUNTER — Telehealth: Payer: Self-pay

## 2018-04-24 NOTE — Telephone Encounter (Signed)
Spoke with the patient about this. She states she is okay with the referral.  She has not done the "tampon test" because she is presently having "urinary problems" and has a CT scan scheduled for later today to investigate this. I will make the referral. Thanks

## 2018-04-24 NOTE — Telephone Encounter (Signed)
-----   Message from Willia Craze, NP sent at 04/23/2018  5:32 PM EDT ----- [name],  I am still waiting on patient to do the tampon test at home but regardless of results patient gives a convincing story for a rectovaginal fistula.  Would you mind referring her to Dr. Sheryn Bison at [facility] for a second surgical opinion.  Thanks

## 2018-04-25 DIAGNOSIS — N210 Calculus in bladder: Secondary | ICD-10-CM | POA: Diagnosis not present

## 2018-04-25 DIAGNOSIS — N1330 Unspecified hydronephrosis: Secondary | ICD-10-CM | POA: Diagnosis not present

## 2018-04-25 DIAGNOSIS — N132 Hydronephrosis with renal and ureteral calculous obstruction: Secondary | ICD-10-CM | POA: Diagnosis not present

## 2018-05-02 ENCOUNTER — Ambulatory Visit (INDEPENDENT_AMBULATORY_CARE_PROVIDER_SITE_OTHER): Payer: Medicare Other | Admitting: Gynecology

## 2018-05-02 DIAGNOSIS — M810 Age-related osteoporosis without current pathological fracture: Secondary | ICD-10-CM | POA: Diagnosis not present

## 2018-05-02 MED ORDER — DENOSUMAB 60 MG/ML ~~LOC~~ SOSY
60.0000 mg | PREFILLED_SYRINGE | Freq: Once | SUBCUTANEOUS | Status: AC
  Administered 2018-05-02: 60 mg via SUBCUTANEOUS

## 2018-05-09 ENCOUNTER — Encounter: Payer: Self-pay | Admitting: Gynecology

## 2018-05-09 DIAGNOSIS — N1330 Unspecified hydronephrosis: Secondary | ICD-10-CM | POA: Diagnosis not present

## 2018-05-09 DIAGNOSIS — K449 Diaphragmatic hernia without obstruction or gangrene: Secondary | ICD-10-CM | POA: Diagnosis not present

## 2018-05-09 DIAGNOSIS — N201 Calculus of ureter: Secondary | ICD-10-CM | POA: Diagnosis not present

## 2018-05-09 NOTE — Telephone Encounter (Signed)
Prolia Given 05/02/18 Next injection 11/03/18

## 2018-05-11 DIAGNOSIS — M5136 Other intervertebral disc degeneration, lumbar region: Secondary | ICD-10-CM | POA: Diagnosis not present

## 2018-05-27 DIAGNOSIS — M5030 Other cervical disc degeneration, unspecified cervical region: Secondary | ICD-10-CM | POA: Diagnosis not present

## 2018-05-28 DIAGNOSIS — I10 Essential (primary) hypertension: Secondary | ICD-10-CM | POA: Diagnosis not present

## 2018-05-28 DIAGNOSIS — C50911 Malignant neoplasm of unspecified site of right female breast: Secondary | ICD-10-CM | POA: Diagnosis not present

## 2018-05-28 DIAGNOSIS — R140 Abdominal distension (gaseous): Secondary | ICD-10-CM | POA: Diagnosis not present

## 2018-05-28 DIAGNOSIS — F329 Major depressive disorder, single episode, unspecified: Secondary | ICD-10-CM | POA: Diagnosis not present

## 2018-05-28 DIAGNOSIS — E782 Mixed hyperlipidemia: Secondary | ICD-10-CM | POA: Diagnosis not present

## 2018-06-13 ENCOUNTER — Encounter: Payer: Self-pay | Admitting: Gynecology

## 2018-06-20 ENCOUNTER — Encounter: Payer: Self-pay | Admitting: Gynecology

## 2018-07-01 DIAGNOSIS — M5136 Other intervertebral disc degeneration, lumbar region: Secondary | ICD-10-CM | POA: Diagnosis not present

## 2018-07-01 DIAGNOSIS — M5030 Other cervical disc degeneration, unspecified cervical region: Secondary | ICD-10-CM | POA: Diagnosis not present

## 2018-07-09 DIAGNOSIS — M542 Cervicalgia: Secondary | ICD-10-CM | POA: Diagnosis not present

## 2018-07-10 NOTE — Progress Notes (Signed)
ID: [name]   DOB: 1946/12/12  MR#: [id number]  JQB#:[id number]  PCP: Wayland Salinas, MD GYN:  SU: Alphonsa Overall,  OTHER MD: Eppie Gibson, Baltazar Apo, Erskine Emery, Jacques Navy, Madelon Lips Wright,   HISTORY OF PRESENT ILLNESS: From the original intake note:  [name] noted a minimal discharge from the right breast early and 2013. She did not think much of this until it became more copious him a and then she brought it to Dr. Ashley Murrain attention and bilateral breast MRI 07/29/2012 showed 2 areas of concern in the right breast. Right breast biopsy x3 was obtained 08/23/2012, and showed ([id number]) atypical ductal hyperplasia partially involving a papillary lesion. Accordingly, on 10/11/2012 the patient underwent right lumpectomy for what proved to be ([id number] ductal carcinoma in situ, low-grade, measuring approximately 3.2 cm, with negative margins (0.15 cm), 100% estrogen receptor positive, 61% progesterone receptor positive.  With his results the patient was referred to radiation oncology and she is currently receiving of radiation treatments under Dr. Isidore Moos, scheduled to be completed mid December 2013.  The patient's subsequent history is as detailed below.  INTERVAL HISTORY: [name] returns today for follow-up and treatment of her noninvasive estrogen receptor positive breast cancer. She continues under observation.   Since her last visit, she underwent diagnostic bilateral mammography with CAD and tomography on 12/20/2017 at [facility] showing: breast density category C. There was no evidence of malignancy.   REVIEW OF SYSTEMS: [name] is using the elliptical for exercise and she also does some cycling, 2 or 3 times a week.  She recently traveled to [place] and did quite a bit of walking there.  She complains of arthritis problems in her neck and lower back and Drs. Rolena Infante and Ramos are helping her with this.  Aside from this detailed review of systems today was  noncontributory  PAST MEDICAL HISTORY: Past Medical History:  Diagnosis Date  . Allergy   . Anemia   . Anxiety    past hx   . Arthritis   . Asthma   . Atrial flutter ([place])    past history- not current  . CAD (coronary artery disease) CARDIOLOGIST--  DR Angelena Form   mild non-obstructive cad  . Cancer ([place])    right  . Cataract    bilaterally removed   . Chronic constipation   . Chronic kidney disease    interstitial cystitis  . COPD (chronic obstructive pulmonary disease) ([place])   . Depression    past hx   . Family history of malignant hyperthermia    father had this  . Fibromyalgia   . Frequency of urination   . GERD (gastroesophageal reflux disease)   . H/O hiatal hernia   . History of basal cell carcinoma excision    X2  . History of breast cancer ONCOLOGIST-- DR Jana Hakim---  NO RECURRANCE   DX 07/2012;  LOW GRADE DCIS  ER+PR+  ----  S/P RIGHT LUMPECTOMY WITH NEGATIVE MARGINS/   RADIATION ENDED 11/2012  . History of chronic bronchitis   . History of colonic polyps   . History of tachycardia    CONTROLLED  WITH ATENOLOL  . Hyperlipidemia   . Hypertension   . Neuromuscular disorder ([facility])    fibromyalgia  . Osteoporosis   . Pelvic pain   . Personal history of radiation therapy   . S/P radiation therapy 11/12/12 - 12/05/12   right Breast  . Sepsis ([place]) 2014   from UTI   . Sinus  headache   . Urgency of urination     PAST SURGICAL HISTORY: Past Surgical History:  Procedure Laterality Date  . [place] STUDY N/A 04/03/2016   Procedure: [place] STUDY;  Surgeon: Manus Gunning, MD;  Location: [facility];  Service: Gastroenterology;  Laterality: N/A;  . BREAST LUMPECTOMY Right 10-11-2012   W/ SLN BX  . CARDIAC CATHETERIZATION  09-13-2007  DR Lia Foyer   WELL-PRESERVED LVF/  DIFFUSE SCATTERED CORONARY CALCIFACATION AND ATHEROSCLEROSIS WITHOUT OBSTRUCTION  . CARDIAC CATHETERIZATION  08-04-2010  DR Angelena Form   NON-OBSTRUCTIVE CAD/  pLAD 40%/  oLAD 30%/   mLAD 30%/  pRCA 30%/  EF 60%  . CARDIOVASCULAR STRESS TEST  06-18-2012  DR McALHANY   LOW RISK NUCLEAR STUDY/  SMALL FIXED AREA OF MODERATELY DECREASED UPTAKE IN ANTEROSEPTAL WALL WHICH MAY BE ARTIFACTUAL/  NO ISCHEMIA/  EF 68%  . COLONOSCOPY  09-29-2010  . CYSTOSCOPY    . CYSTOSCOPY WITH HYDRODISTENSION AND BIOPSY N/A 03/06/2014   Procedure: CYSTOSCOPY/HYDRODISTENSION/ INSTILATION OF MARCAINE AND PYRIDIUM;  Surgeon: Ailene Rud, MD;  Location: [facility];  Service: Urology;  Laterality: N/A;  . ESOPHAGEAL MANOMETRY N/A 04/03/2016   Procedure: ESOPHAGEAL MANOMETRY (EM);  Surgeon: Manus Gunning, MD;  Location: [facility];  Service: Gastroenterology;  Laterality: N/A;  . NASAL SINUS SURGERY  1985  . ORIF RIGHT ANKLE  FX  2006  . POLYPECTOMY    . REMOVAL VOCAL CORD CYST  FEB 2014  . RIGHT BREAST BX  08-23-2012  . RIGHT HAND SURGERY  X3  LAST ONE 2009   INCLUDES  ORIF RIGHT 5TH FINGER AND REVISION TWICE  . TONSILLECTOMY AND ADENOIDECTOMY  AGE 74  . TOTAL ABDOMINAL HYSTERECTOMY W/ BILATERAL SALPINGOOPHORECTOMY  1982   W/  APPENDECTOMY  . TRANSTHORACIC ECHOCARDIOGRAM  06-24-2012   GRADE I DIASTOLIC DYSFUNCTION/  EF 55-60%/  MILD MR  . UPPER GASTROINTESTINAL ENDOSCOPY      FAMILY HISTORY Family History  Problem Relation Age of Onset  . Rectal cancer Mother   . Colon cancer Mother   . Breast cancer Mother   . Pancreatic cancer Mother   . Breast cancer Maternal Aunt        breast  . Irritable bowel syndrome Son   . Irritable bowel syndrome Daughter   . Colon cancer Father   . Colon polyps Father   . Breast cancer Paternal Aunt   . Breast cancer Paternal Aunt   . Esophageal cancer Neg Hx   . Stomach cancer Neg Hx     GYNECOLOGIC HISTORY: Menarche age 21, first live birth age 45, TAH/BSO in 46. She was on hormone replacement between 1982 and 2013, at the time of her breast cancer diagnosis.  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 Her husband [name] used to be Insurance underwriter for [employer], now is retired and has helped manage a friend's car lot, something he sees as an extension of his ministry. Their children are Minda Ditto, who is an Chief Financial Officer with [employer] in [place]; Skip Estimable, who works as an Radio producer for [employer] and lives in [place]; and Boswell, who works as a Radio producer (fourth-grade) in [place].   ADVANCED DIRECTIVES: In place  HEALTH MAINTENANCE: Social History   Tobacco Use  . Smoking status: Former Smoker    Packs/day: 1.00    Years: 15.00  Pack years: 15.00    Types: Cigarettes    Last attempt to quit: 05/27/2005    Years since quitting: 13.1  . Smokeless tobacco: Never Used  Substance Use Topics  . Alcohol use: No    Alcohol/week: 0.0 oz  . Drug use: No     Colonoscopy: 09/29/2010 Erskine Emery)  PAP: JAN 2013 ([place])  Bone density: 04/17/2011/ osteoporosis  Lipid panel:  Allergies  Allergen Reactions  . Clindamycin Hcl Shortness Of Breath and Rash  . Penicillins Anaphylaxis  . Prednisone Shortness Of Breath and Rash  . Codeine Hives and Other (See Comments)    headache  . Fluzone [Flu Virus Vaccine] Other (See Comments)    Local reaction at the site  . Latex Hives  . Pentazocine Lactate Other (See Comments)    HALLUCINATION  . Pneumococcal Vaccine Polyvalent Hives, Swelling and Other (See Comments)    REACTION: redness, swelling, and hives at injection site  . Tamoxifen Nausea And Vomiting and Other (See Comments)    HEADACHE    Current Outpatient Medications  Medication Sig Dispense Refill  . acetaminophen (TYLENOL) 500 MG tablet Take 1,000 mg by mouth every 6 (six) hours as needed for mild pain or headache.     . albuterol (PROVENTIL HFA;VENTOLIN HFA) 108 (90 BASE) MCG/ACT inhaler Inhale 2 puffs into the lungs every 4 (four) hours as needed. 1 Inhaler 5  .  amLODipine (NORVASC) 5 MG tablet Take 1 tablet (5 mg total) by mouth daily. (Patient taking differently: Take 5 mg by mouth every evening. ) 90 tablet 3  . aspirin EC 81 MG tablet Take 1 tablet (81 mg total) by mouth daily. 90 tablet 3  . atenolol (TENORMIN) 25 MG tablet Take 1 tablet (25 mg total) by mouth 3 (three) times daily before meals. (Patient taking differently: Take 25 mg by mouth 2 (two) times daily. ) 90 tablet 11  . DULoxetine (CYMBALTA) 60 MG capsule TAKE 1 BY MOUTH TWICE DAILY 180 capsule 1  . Estradiol 10 MCG TABS vaginal tablet Place 1 tablet (10 mcg total) 2 (two) times a week vaginally. 8 tablet 11  . Evolocumab (REPATHA SURECLICK) 952 MG/ML SOAJ Inject 140 mg into the skin every 14 (fourteen) days. 1 pen 12  . fluticasone (FLONASE) 50 MCG/ACT nasal spray Place 1 spray into the nose as needed for allergies.     . furosemide (LASIX) 20 MG tablet Take 1 tablet (20 mg total) by mouth every morning. 180 tablet 1  . gabapentin (NEURONTIN) 300 MG capsule Take 1 capsule (300 mg total) by mouth 2 (two) times daily. 60 capsule 6  . loratadine (CLARITIN) 10 MG tablet Take 1 tablet (10 mg total) by mouth daily. 30 tablet 11  . losartan (COZAAR) 100 MG tablet Take 1 tablet (100 mg total) by mouth daily. 90 tablet 3  . omeprazole (PRILOSEC) 40 MG capsule Take 40 mg by mouth 2 (two) times daily.    . pentosan polysulfate (ELMIRON) 100 MG capsule Take 1 capsule (100 mg total) by mouth 3 (three) times daily. Reported on 01/05/2016 (Patient taking differently: Take 100 mg by mouth 3 (three) times daily with meals as needed. Reported on 01/05/2016) 90 capsule 11  . traMADol (ULTRAM) 50 MG tablet Take 1-2 tablets (50-100 mg total) by mouth 3 (three) times daily as needed for moderate pain. (Patient taking differently: Take 50-100 mg by mouth 3 (three) times daily as needed for moderate pain (pt uses TID). ) 180 tablet 0  .  traZODone (DESYREL) 100 MG tablet Take 1 tablet (100 mg total) by mouth at  bedtime. 180 tablet 1   Current Facility-Administered Medications  Medication Dose Route Frequency Provider Last Rate Last Dose  . 0.9 %  sodium chloride infusion  500 mL Intravenous Continuous Armbruster, Carlota Raspberry, MD      . 0.9 %  sodium chloride infusion  500 mL Intravenous Continuous Armbruster, Carlota Raspberry, MD       Facility-Administered Medications Ordered in Other Visits  Medication Dose Route Frequency Provider Last Rate Last Dose  . bupivacaine (MARCAINE) 0.5 % 10 mL, triamcinolone acetonide (KENALOG-40) 40 mg injection   Subcutaneous Once Carolan Clines, MD      . bupivacaine (MARCAINE) 0.5 % 15 mL, phenazopyridine (PYRIDIUM) 400 mg bladder mixture   Bladder Instillation Once Carolan Clines, MD        OBJECTIVE: middle-aged white woman who appears stated age  10:   07/11/18 1200  BP: 120/71  Pulse: 63  Resp: 18  Temp: 97.9 F (36.6 C)  SpO2: 100%     Body mass index is 22.02 kg/m.    ECOG FS: 1  Sclerae unicteric, EOMs intact Oropharynx clear and moist No cervical or supraclavicular adenopathy Lungs no rales or rhonchi Heart regular rate and rhythm Abd soft, nontender, positive bowel sounds MSK no focal spinal tenderness, no upper extremity lymphedema Neuro: nonfocal, well oriented, appropriate affect Breasts: Status post right lumpectomy and radiation.  There is no evidence of local recurrence.  The left breast is benign.  Both axillae are benign.  LAB RESULTS: Lab Results  Component Value Date   WBC 5.9 09/10/2017   NEUTROABS 3.0 04/20/2015   HGB 12.2 09/10/2017   HCT 38.0 09/10/2017   MCV 94 09/10/2017   PLT 286 09/10/2017      Chemistry      Component Value Date/Time   NA 139 12/20/2017 1419   NA 140 04/20/2015 1050   K 3.7 12/20/2017 1419   K 3.9 04/20/2015 1050   CL 99 12/20/2017 1419   CL 103 11/12/2012 1549   CO2 26 12/20/2017 1419   CO2 26 04/20/2015 1050   BUN 12 12/20/2017 1419   BUN 13.1 04/20/2015 1050   CREATININE 1.06 (H)  12/20/2017 1419   CREATININE 1.1 04/20/2015 1050      Component Value Date/Time   CALCIUM 8.9 12/20/2017 1419   CALCIUM 8.9 04/20/2015 1050   ALKPHOS 86 09/10/2017 1002   ALKPHOS 87 04/20/2015 1050   AST 20 09/10/2017 1002   AST 23 04/20/2015 1050   ALT 12 09/10/2017 1002   ALT 16 04/20/2015 1050   BILITOT <0.2 09/10/2017 1002   BILITOT 0.26 04/20/2015 1050       No results found for: LABCA2  No components found for: LABCA125  No results for input(s): INR in the last 168 hours.  Urinalysis    Component Value Date/Time   COLORURINE YELLOW 05/29/2017 1629   APPEARANCEUR CLEAR 05/29/2017 1629   LABSPEC 1.020 05/29/2017 1629   PHURINE 6.0 05/29/2017 1629   GLUCOSEU NEGATIVE 05/29/2017 1629   GLUCOSEU NEGATIVE 07/19/2011 1457   HGBUR NEGATIVE 05/29/2017 1629   BILIRUBINUR NEGATIVE 05/29/2017 1629   BILIRUBINUR Neg 04/30/2015 1153   KETONESUR NEGATIVE 05/29/2017 1629   PROTEINUR NEGATIVE 05/29/2017 1629   UROBILINOGEN 0.2 04/30/2015 1153   UROBILINOGEN 0.2 01/06/2014 1219   NITRITE NEGATIVE 05/29/2017 1629   LEUKOCYTESUR NEGATIVE 05/29/2017 1629    STUDIES: Since her last visit, she underwent diagnostic bilateral  mammography with CAD and tomography on 12/20/2017 at [facility] showing: breast density category C. There was no evidence of malignancy.   ASSESSMENT: 72 y.o. [name] woman s/p Right lumpectomy 10/11/2012 for low-grade ductal carcinoma in situ, estrogen and progesterone receptor positive, with negative though close margins  (1) radiation therapy completed 12/05/2012  (2) tamoxifen started at the completion of radiation, tolerated poorly, discontinued 03/10/2013  (3) breast density category D  (a) breast density decreased to category C as of December 2018 mammography  PLAN:  [name] is now just about 6 years out from definitive surgery for her breast cancer with no evidence of disease recurrence.  This is favorable.  Her breasts are becoming less  dense.  We did discuss the new indication for breast MRI namely the patient with breast cancer or history of breast cancer and dense breasts.  Generally I recommend MRIs in that case for patients with category D density.  Her breasts are becoming less dense that she becomes older and of course this is favorable.  The problem of course is cost.  At this point she is comfortable not proceeding to breast MRI.  If we do develop the FAST MRI technique here in [place], which I believe we will have ready within the next year, then we could consider yearly MRIs at that point since it would be much less expensive  I commended her excellent exercise program  She will return to see me again in 1 year.  She knows to call for any other issues that may develop before the next visit.   Magrinat, Virgie Dad, MD  07/11/18 2:10 PM Medical Oncology and Hematology [facility] [address] [place], [place] [address] Tel. [phone]    Fax. [phone]  Alice Rieger, am acting as scribe for Chauncey Cruel MD.  I, Lurline Del MD, have reviewed the above documentation for accuracy and completeness, and I agree with the above.

## 2018-07-11 ENCOUNTER — Inpatient Hospital Stay: Payer: Medicare Other | Attending: Oncology | Admitting: Oncology

## 2018-07-11 ENCOUNTER — Telehealth: Payer: Self-pay

## 2018-07-11 VITALS — BP 120/71 | HR 63 | Temp 97.9°F | Resp 18 | Ht 64.0 in | Wt 128.3 lb

## 2018-07-11 DIAGNOSIS — Z170 Estrogen receptor positive status [ER+]: Secondary | ICD-10-CM

## 2018-07-11 DIAGNOSIS — Z853 Personal history of malignant neoplasm of breast: Secondary | ICD-10-CM | POA: Insufficient documentation

## 2018-07-11 DIAGNOSIS — C50511 Malignant neoplasm of lower-outer quadrant of right female breast: Secondary | ICD-10-CM

## 2018-07-11 NOTE — Telephone Encounter (Signed)
Printed avs and calender of upcoming appointment. Per 7/18 los 

## 2018-07-12 DIAGNOSIS — M5412 Radiculopathy, cervical region: Secondary | ICD-10-CM | POA: Diagnosis not present

## 2018-07-12 DIAGNOSIS — M5416 Radiculopathy, lumbar region: Secondary | ICD-10-CM | POA: Diagnosis not present

## 2018-07-25 DIAGNOSIS — M5030 Other cervical disc degeneration, unspecified cervical region: Secondary | ICD-10-CM | POA: Diagnosis not present

## 2018-07-25 DIAGNOSIS — M5136 Other intervertebral disc degeneration, lumbar region: Secondary | ICD-10-CM | POA: Diagnosis not present

## 2018-08-20 DIAGNOSIS — M4180 Other forms of scoliosis, site unspecified: Secondary | ICD-10-CM | POA: Diagnosis not present

## 2018-08-20 DIAGNOSIS — M5136 Other intervertebral disc degeneration, lumbar region: Secondary | ICD-10-CM | POA: Diagnosis not present

## 2018-08-20 DIAGNOSIS — M5030 Other cervical disc degeneration, unspecified cervical region: Secondary | ICD-10-CM | POA: Diagnosis not present

## 2018-08-28 ENCOUNTER — Ambulatory Visit: Payer: Medicare Other | Admitting: Physical Therapy

## 2018-08-28 ENCOUNTER — Encounter: Payer: Self-pay | Admitting: Physical Therapy

## 2018-08-28 DIAGNOSIS — M6281 Muscle weakness (generalized): Secondary | ICD-10-CM | POA: Diagnosis not present

## 2018-08-28 DIAGNOSIS — M62838 Other muscle spasm: Secondary | ICD-10-CM

## 2018-08-28 DIAGNOSIS — M546 Pain in thoracic spine: Secondary | ICD-10-CM

## 2018-08-28 NOTE — Patient Instructions (Signed)
Access Code: [email or web address]  URL: [email or web address]  Date: 08/28/2018  Prepared by: Faustino Congress   Exercises  Supine Lower Trunk Rotation - 3 reps - 1 sets - 30 sec hold - 2x daily - 7x weekly  Supine Piriformis Stretch with Foot on Ground - 3 reps - 1 sets - 30 sec hold - 2x daily - 7x weekly  Supine Single Knee to Chest Stretch - 3 reps - 1 sets - 30 sec hold - 2x daily - 7x weekly  Patient Education  Trigger Point Dry Needling

## 2018-08-28 NOTE — Therapy (Signed)
[place] [place], [place], [address] Phone: [phone]   Fax:  [phone]  Physical Therapy Evaluation  Patient Details  Name: Anaka Beazer MRN: [id number] Date of Birth: [birth year] Referring Provider: Dr. Melina Schools   Encounter Date: 08/28/2018  PT End of Session - 08/28/18 1113    Visit Number  1    Number of Visits  12    Date for PT Re-Evaluation  10/09/18    Authorization Type  [employer]    PT Start Time  1025    PT Stop Time  1110    PT Time Calculation (min)  45 min    Activity Tolerance  Patient tolerated treatment well    Behavior During Therapy  [facility] for tasks assessed/performed       Past Medical History:  Diagnosis Date  . Allergy   . Anemia   . Anxiety    past hx   . Arthritis   . Asthma   . Atrial flutter ([place])    past history- not current  . CAD (coronary artery disease) CARDIOLOGIST--  DR Angelena Form   mild non-obstructive cad  . Cancer ([place])    right  . Cataract    bilaterally removed   . Chronic constipation   . Chronic kidney disease    interstitial cystitis  . COPD (chronic obstructive pulmonary disease) ([place])   . Depression    past hx   . Family history of malignant hyperthermia    father had this  . Fibromyalgia   . Frequency of urination   . GERD (gastroesophageal reflux disease)   . H/O hiatal hernia   . History of basal cell carcinoma excision    X2  . History of breast cancer ONCOLOGIST-- DR Jana Hakim---  NO RECURRANCE   DX 07/2012;  LOW GRADE DCIS  ER+PR+  ----  S/P RIGHT LUMPECTOMY WITH NEGATIVE MARGINS/   RADIATION ENDED 11/2012  . History of chronic bronchitis   . History of colonic polyps   . History of tachycardia    CONTROLLED  WITH ATENOLOL  . Hyperlipidemia   . Hypertension   . Neuromuscular disorder (HCC)    fibromyalgia  . Osteoporosis   . Pelvic pain   . Personal history of radiation therapy   . S/P radiation therapy 11/12/12 - 12/05/12    right Breast  . Sepsis ([place]) 2014   from UTI   . Sinus headache   . Urgency of urination     Past Surgical History:  Procedure Laterality Date  . [place] STUDY N/A 04/03/2016   Procedure: [place] STUDY;  Surgeon: Manus Gunning, MD;  [facility];  Service: Gastroenterology;  Laterality: N/A;  . BREAST LUMPECTOMY Right 10-11-2012   W/ SLN BX  . CARDIAC CATHETERIZATION  09-13-2007  DR Lia Foyer   WELL-PRESERVED LVF/  DIFFUSE SCATTERED CORONARY CALCIFACATION AND ATHEROSCLEROSIS WITHOUT OBSTRUCTION  . CARDIAC CATHETERIZATION  08-04-2010  DR Angelena Form   NON-OBSTRUCTIVE CAD/  pLAD 40%/  oLAD 30%/  mLAD 30%/  pRCA 30%/  EF 60%  . CARDIOVASCULAR STRESS TEST  06-18-2012  DR McALHANY   LOW RISK NUCLEAR STUDY/  SMALL FIXED AREA OF MODERATELY DECREASED UPTAKE IN ANTEROSEPTAL WALL WHICH MAY BE ARTIFACTUAL/  NO ISCHEMIA/  EF 68%  . COLONOSCOPY  09-29-2010  . CYSTOSCOPY    . CYSTOSCOPY WITH HYDRODISTENSION AND BIOPSY N/A 03/06/2014   Procedure: CYSTOSCOPY/HYDRODISTENSION/ INSTILATION OF MARCAINE AND PYRIDIUM;  Surgeon: Ailene Rud, MD;  Location: [facility];  Service: Urology;  Laterality: N/A;  . ESOPHAGEAL MANOMETRY N/A 04/03/2016   Procedure: ESOPHAGEAL MANOMETRY (EM);  Surgeon: Manus Gunning, MD;  [facility];  Service: Gastroenterology;  Laterality: N/A;  . NASAL SINUS SURGERY  1985  . ORIF RIGHT ANKLE  FX  2006  . POLYPECTOMY    . REMOVAL VOCAL CORD CYST  FEB 2014  . RIGHT BREAST BX  08-23-2012  . RIGHT HAND SURGERY  X3  LAST ONE 2009   INCLUDES  ORIF RIGHT 5TH FINGER AND REVISION TWICE  . TONSILLECTOMY AND ADENOIDECTOMY  AGE 43  . TOTAL ABDOMINAL HYSTERECTOMY W/ BILATERAL SALPINGOOPHORECTOMY  1982   W/  APPENDECTOMY  . TRANSTHORACIC ECHOCARDIOGRAM  06-24-2012   GRADE I DIASTOLIC DYSFUNCTION/  EF 55-60%/  MILD MR  . UPPER GASTROINTESTINAL ENDOSCOPY      There were no vitals filed for this visit.   Subjective  Assessment - 08/28/18 1030    Subjective  Pt is a 72 y/o female who presents to OPPT for scoliosis resulting in back pain.  Pt reports pain began in March 2019 with radiating pain into bil LEs; and also with kidney stones but pain continued following resolve of kidney stones.  Pt reports numbness throughout bil LEs and increased pain with ADLs, especially toileting.    Limitations  Sitting;Walking;Standing    How long can you sit comfortably?  30 min    How long can you stand comfortably?  40 min    How long can you walk comfortably?  20-30 min    Patient Stated Goals  be able to perform toileting without pain, sleep better at night    Currently in Pain?  Yes    Pain Score  6    at best 4/10; at worst 10/10   Pain Location  Back    Pain Orientation  Mid;Right;Left   Rt>Lt   Pain Descriptors / Indicators  Numbness    Pain Type  Neuropathic pain;Chronic pain    Pain Radiating Towards  bil LEs with numbness    Pain Onset  More than a month ago    Pain Frequency  Constant    Aggravating Factors   bending, prolonged sitting/standing, ironing    Pain Relieving Factors  repositioning, medication         [facility] PT Assessment - 08/28/18 1035      Assessment   Medical Diagnosis  scoliosis    Referring Provider  Dr. Melina Schools    Onset Date/Surgical Date  --   March 2019   Hand Dominance  Right    Next MD Visit  4 weeks    Prior Therapy  multiple episodes      Precautions   Precautions  None;Fall      Restrictions   Weight Bearing Restrictions  No      Balance Screen   Has the patient fallen in the past 6 months  Yes    How many times?  3    Has the patient had a decrease in activity level because of a fear of falling?   Yes    Is the patient reluctant to leave their home because of a fear of falling?   No      Home Environment   Living Environment  Private residence    Living Arrangements  Spouse/significant other    Type of [place] to enter     Entrance  Stairs-Number of Steps  2    Entrance Stairs-Rails  Right    Home Layout  Two level;Bed/bath upstairs    Alternate Level Stairs-Number of Steps  14    Alternate Level Stairs-Rails  Right    Additional Comments  reports some SOB with stairs; c/o back pain with stairs      Prior Function   Level of Independence  Independent    Vocation  Retired    Biomedical scientist  Retired Therapist, sports    Leisure  go to [employer] (Higher education careers adviser only), heating pad; active in church      Cognition   Overall Cognitive Status  Within Functional Limits for tasks assessed      Observation/Other Assessments   Focus on Therapeutic Outcomes (FOTO)   29 (71% limited; predicted 55% limited)      Posture/Postural Control   Posture/Postural Control  Postural limitations    Postural Limitations  Rounded Shoulders;Forward head    Posture Comments  concave Lt thoracic curve      ROM / Strength   AROM / PROM / Strength  AROM;Strength      AROM   Overall AROM Comments  thoracolumbar AROM WNL except flexion limited 25% and with increased pain in flexion      Strength   Strength Assessment Site  Hip;Knee;Ankle    Right/Left Hip  Right;Left    Right Hip Flexion  3/5    Right Hip Extension  3/5    Right Hip ABduction  3+/5    Left Hip Flexion  4/5    Left Hip Extension  3+/5    Left Hip ABduction  4/5    Right/Left Knee  Right;Left    Right Knee Flexion  3/5    Right Knee Extension  4/5    Left Knee Flexion  3/5    Left Knee Extension  5/5    Right/Left Ankle  Right;Left    Right Ankle Dorsiflexion  4/5    Left Ankle Dorsiflexion  4/5      Flexibility   Soft Tissue Assessment /Muscle Length  yes   bil hip flexor tightness   Piriformis  tightness bil      Palpation   Palpation comment  trigger points noted throughout lower thoracic and lumbar paraspinals; bil glutes and piriformis                Objective measurements completed on examination: See above findings.      [place] Adult  PT Treatment/Exercise - 08/28/18 1035      Exercises   Exercises  Lumbar      Lumbar Exercises: Stretches   Single Knee to Chest Stretch  Right;Left;1 rep;30 seconds    Lower Trunk Rotation  1 rep;30 seconds    Piriformis Stretch  Right;Left;1 rep;30 seconds      Manual Therapy   Manual therapy comments  educated in use of ball for myofascial release and STM             PT Education - 08/28/18 1113    Education Details  HEP    Person(s) Educated  Patient    Methods  Explanation;Demonstration;Handout    Comprehension  Verbalized understanding;Returned demonstration;Need further instruction          PT Long Term Goals - 08/28/18 1300      PT LONG TERM GOAL #1   Title  independent with HEP    Status  New    Target Date  10/09/18  PT LONG TERM GOAL #2   Title  FOTO score improved to </= 55% limited for improved function    Status  New    Target Date  10/09/18      PT LONG TERM GOAL #3   Title  perform thoracolumbar flexion without increase in pain    Status  New    Target Date  10/09/18      PT LONG TERM GOAL #4   Title  demonstrate at least 4/5 bil LE strength for improved function    Status  New    Target Date  10/09/18      PT LONG TERM GOAL #5   Title  n/a             Plan - 08/28/18 1249    Clinical Impression Statement  Pt is a 72 y/o female who presents to OPPT for mid to low back pain, with new diagnosis of scoliosis.  Pt demonstrates normal ROM, but pain with forward flexion, and decreased strength in lower extremities and core.  Pt also with active trigger points and may benefit from DN and manual therapy.  Will benefit from PT to address deficits listed.    History and Personal Factors relevant to plan of care:  fibromyalgia, arthritis, atrial flutter, HTN    Clinical Presentation  Evolving    Clinical Presentation due to:  elevated pain levels, c/o lower extremity numbness    Clinical Decision Making  Moderate    Rehab Potential  Good     PT Frequency  2x / week    PT Duration  6 weeks    PT Treatment/Interventions  ADLs/Self Care Home Management;Cryotherapy;Electrical Stimulation;Moist Heat;Traction;Ultrasound;Stair training;Functional mobility training;Therapeutic activities;Therapeutic exercise;Patient/family education;Neuromuscular re-education;Manual techniques;Dry needling    PT Next Visit Plan  review HEP, DN/manual therapy/modalities PRN, hip/core strengthening    PT Home Exercise Plan  Access Code: [email or web address]     Consulted and Agree with Plan of Care  Patient       Patient will benefit from skilled therapeutic intervention in order to improve the following deficits and impairments:  Difficulty walking, Decreased mobility, Increased muscle spasms, Improper body mechanics, Postural dysfunction, Impaired flexibility, Increased fascial restricitons, Decreased strength  Visit Diagnosis: Pain in thoracic spine - Plan: PT plan of care cert/re-cert  Muscle weakness (generalized) - Plan: PT plan of care cert/re-cert  Other muscle spasm - Plan: PT plan of care cert/re-cert     Problem List Patient Active Problem List   Diagnosis Date Noted  . Excessive postexertional fatigue 04/02/2017  . Pneumonia due to virus 04/02/2017  . Chronic interstitial cystitis 02/01/2017  . Acute pharyngitis 01/02/2017  . Dysphagia   . Esophageal reflux   . Allergic rhinitis 01/25/2016  . Shortness of breath   . Left hip pain 04/30/2015  . Sciatica 04/30/2015  . Knee pain, right 04/30/2015  . Leukocytes in urine 04/30/2015  . Hyperlipidemia 03/01/2015  . Bilateral hand numbness 03/01/2015  . Pulmonary nodules 02/12/2015  . External hemorrhoids 02/05/2015  . Chronic night sweats 01/08/2015  . Atypical chest pain 01/01/2015  . Chest pain 01/01/2015  . Renal insufficiency 07/16/2014  . Sepsis secondary to UTI ([place]) 02/05/2014  . Grief reaction 12/29/2013  . Urinary hesitancy 12/04/2013  . Abdominal pain, other specified site  10/26/2013  . Headache(784.0) 10/13/2013  . Pain, joint, multiple sites 08/18/2013  . Vasculitis ([place]) 07/29/2013  . ANA positive 07/29/2013  . Other and unspecified hyperlipidemia 07/12/2013  . Contusion, chest wall 04/30/2013  .  Malignant neoplasm of lower-outer quadrant of right breast of female, estrogen receptor positive ([place]) 01/03/2013  . Splenic lesion 09/02/2012  . Asthma, allergic 08/05/2012  . GERD (gastroesophageal reflux disease) 06/03/2012  . Former heavy tobacco smoker 06/03/2012  . Edema 04/08/2012  . Stricture and stenosis of esophagus 10/19/2011  . Constipation - functional 07/21/2011  . Nonspecific (abnormal) findings on radiological and other examination of biliary tract 07/21/2011  . Osteoporosis 04/17/2011  . POSTMENOPAUSAL STATUS 01/27/2011  . FIBROCYSTIC BREAST DISEASE, HX OF 10/18/2010  . Essential hypertension 08/25/2010  . CAD in native artery 08/25/2010  . TOBACCO ABUSE, HX OF 08/25/2010  . GENERALIZED ANXIETY DISORDER 08/03/2010  . FIBROMYALGIA 01/11/2010  . SKIN CANCER, HX OF 01/11/2010      Laureen Abrahams, PT, DPT 08/28/18 1:06 PM     [facility] [place], [place], [address] Phone: [phone]   Fax:  [phone]  Name: Jen Benedict MRN: [id number] Date of Birth: 08-21-1946

## 2018-08-30 ENCOUNTER — Encounter: Payer: Self-pay | Admitting: Physical Therapy

## 2018-08-30 ENCOUNTER — Ambulatory Visit: Payer: Medicare Other | Admitting: Physical Therapy

## 2018-08-30 DIAGNOSIS — M546 Pain in thoracic spine: Secondary | ICD-10-CM

## 2018-08-30 DIAGNOSIS — M62838 Other muscle spasm: Secondary | ICD-10-CM

## 2018-08-30 DIAGNOSIS — M6281 Muscle weakness (generalized): Secondary | ICD-10-CM | POA: Diagnosis not present

## 2018-08-30 NOTE — Therapy (Signed)
[place], [place], [address] Phone: [phone]   Fax:  [phone]  Physical Therapy Treatment  Patient Details  Name: [name] MRN: [id number] Date of Birth: 25-Apr-1946 Referring Provider: Dr. Melina Schools   Encounter Date: 08/30/2018  PT End of Session - 08/30/18 1247    Visit Number  2    Number of Visits  12    Date for PT Re-Evaluation  10/09/18    Authorization Type  [employer]    PT Start Time  1202    PT Stop Time  1241    PT Time Calculation (min)  39 min    Activity Tolerance  Patient tolerated treatment well    Behavior During Therapy  [facility] for tasks assessed/performed       Past Medical History:  Diagnosis Date  . Allergy   . Anemia   . Anxiety    past hx   . Arthritis   . Asthma   . Atrial flutter ([place])    past history- not current  . CAD (coronary artery disease) CARDIOLOGIST--  DR Angelena Form   mild non-obstructive cad  . Cancer ([place])    right  . Cataract    bilaterally removed   . Chronic constipation   . Chronic kidney disease    interstitial cystitis  . COPD (chronic obstructive pulmonary disease) ([place])   . Depression    past hx   . Family history of malignant hyperthermia    father had this  . Fibromyalgia   . Frequency of urination   . GERD (gastroesophageal reflux disease)   . H/O hiatal hernia   . History of basal cell carcinoma excision    X2  . History of breast cancer ONCOLOGIST-- DR Jana Hakim---  NO RECURRANCE   DX 07/2012;  LOW GRADE DCIS  ER+PR+  ----  S/P RIGHT LUMPECTOMY WITH NEGATIVE MARGINS/   RADIATION ENDED 11/2012  . History of chronic bronchitis   . History of colonic polyps   . History of tachycardia    CONTROLLED  WITH ATENOLOL  . Hyperlipidemia   . Hypertension   . Neuromuscular disorder (HCC)    fibromyalgia  . Osteoporosis   . Pelvic pain   . Personal history of radiation therapy   . S/P radiation therapy 11/12/12 - 12/05/12    right Breast  . Sepsis ([place]) 2014   from UTI   . Sinus headache   . Urgency of urination     Past Surgical History:  Procedure Laterality Date  . [place] STUDY N/A 04/03/2016   Procedure: [place] STUDY;  Surgeon: Manus Gunning, MD;  [facility];  Service: Gastroenterology;  Laterality: N/A;  . BREAST LUMPECTOMY Right 10-11-2012   W/ SLN BX  . CARDIAC CATHETERIZATION  09-13-2007  DR Lia Foyer   WELL-PRESERVED LVF/  DIFFUSE SCATTERED CORONARY CALCIFACATION AND ATHEROSCLEROSIS WITHOUT OBSTRUCTION  . CARDIAC CATHETERIZATION  08-04-2010  DR Angelena Form   NON-OBSTRUCTIVE CAD/  pLAD 40%/  oLAD 30%/  mLAD 30%/  pRCA 30%/  EF 60%  . CARDIOVASCULAR STRESS TEST  06-18-2012  DR McALHANY   LOW RISK NUCLEAR STUDY/  SMALL FIXED AREA OF MODERATELY DECREASED UPTAKE IN ANTEROSEPTAL WALL WHICH MAY BE ARTIFACTUAL/  NO ISCHEMIA/  EF 68%  . COLONOSCOPY  09-29-2010  . CYSTOSCOPY    . CYSTOSCOPY WITH HYDRODISTENSION AND BIOPSY N/A 03/06/2014   Procedure: CYSTOSCOPY/HYDRODISTENSION/ INSTILATION OF MARCAINE AND PYRIDIUM;  Surgeon: Ailene Rud, MD;  Location: [facility];  Service: Urology;  Laterality: N/A;  . ESOPHAGEAL MANOMETRY N/A 04/03/2016   Procedure: ESOPHAGEAL MANOMETRY (EM);  Surgeon: Manus Gunning, MD;  [facility];  Service: Gastroenterology;  Laterality: N/A;  . NASAL SINUS SURGERY  1985  . ORIF RIGHT ANKLE  FX  2006  . POLYPECTOMY    . REMOVAL VOCAL CORD CYST  FEB 2014  . RIGHT BREAST BX  08-23-2012  . RIGHT HAND SURGERY  X3  LAST ONE 2009   INCLUDES  ORIF RIGHT 5TH FINGER AND REVISION TWICE  . TONSILLECTOMY AND ADENOIDECTOMY  AGE 1  . TOTAL ABDOMINAL HYSTERECTOMY W/ BILATERAL SALPINGOOPHORECTOMY  1982   W/  APPENDECTOMY  . TRANSTHORACIC ECHOCARDIOGRAM  06-24-2012   GRADE I DIASTOLIC DYSFUNCTION/  EF 55-60%/  MILD MR  . UPPER GASTROINTESTINAL ENDOSCOPY      There were no vitals filed for this visit.  Subjective Assessment  - 08/30/18 1213    Subjective  "I'm just sick of hurting, but I have no motivation to exercise." Reports she has access to [employer], and husband goes every day but pt doesn't go often.    Limitations  Sitting;Walking;Standing    How long can you sit comfortably?  30 min    How long can you stand comfortably?  40 min    How long can you walk comfortably?  20-30 min    Patient Stated Goals  be able to perform toileting without pain, sleep better at night    Currently in Pain?  Yes    Pain Score  6     Pain Location  Back    Pain Orientation  Right;Left;Mid    Pain Descriptors / Indicators  Numbness    Pain Onset  More than a month ago                       [facility] Adult PT Treatment/Exercise - 08/30/18 1214      Exercises   Exercises  Lumbar      Lumbar Exercises: Stretches   Single Knee to Chest Stretch  Right;Left;3 reps;30 seconds    Lower Trunk Rotation  3 reps;30 seconds    Piriformis Stretch  Right;Left;3 reps;30 seconds    Other Lumbar Stretch Exercise  childs pose 3x15 sec      Lumbar Exercises: Supine   Pelvic Tilt  10 reps;5 seconds    Clam  10 reps    Clam Limitations  alternating with ab set    Heel Slides  10 reps    Heel Slides Limitations  alternating with ab set    Bent Knee Raise  10 reps    Bent Knee Raise Limitations  alternating with ab set      Lumbar Exercises: Quadruped   [place]  10 reps             PT Education - 08/30/18 1246    Education Details  HEP, ways to increase activity/exercise    Person(s) Educated  Patient    Methods  Explanation;Demonstration;Handout    Comprehension  Verbalized understanding;Returned demonstration;Need further instruction          PT Long Term Goals - 08/28/18 1300      PT LONG TERM GOAL #1   Title  independent with HEP    Status  New    Target Date  10/09/18      PT LONG TERM GOAL #2   Title  FOTO score improved  to </= 55% limited for improved function    Status  New     Target Date  10/09/18      PT LONG TERM GOAL #3   Title  perform thoracolumbar flexion without increase in pain    Status  New    Target Date  10/09/18      PT LONG TERM GOAL #4   Title  demonstrate at least 4/5 bil LE strength for improved function    Status  New    Target Date  10/09/18      PT LONG TERM GOAL #5   Title  n/a            Plan - 08/30/18 1247    Clinical Impression Statement  Pt reports continued pain but feels exercises are helping.  Pt states she has limited motivation for continued exercise outside of PT.  Long discussion with pt about benefits of exercise and need for continued physical activity outside of PT.  Pt verbalized understanding and helped pt with fitness plan going forward.    Rehab Potential  Good    PT Frequency  2x / week    PT Duration  6 weeks    PT Treatment/Interventions  ADLs/Self Care Home Management;Cryotherapy;Electrical Stimulation;Moist Heat;Traction;Ultrasound;Stair training;Functional mobility training;Therapeutic activities;Therapeutic exercise;Patient/family education;Neuromuscular re-education;Manual techniques;Dry needling    PT Next Visit Plan  review HEP, DN/manual therapy/modalities PRN, hip/core strengthening    PT Home Exercise Plan  Access Code: [email or web address]     Consulted and Agree with Plan of Care  Patient       Patient will benefit from skilled therapeutic intervention in order to improve the following deficits and impairments:  Difficulty walking, Decreased mobility, Increased muscle spasms, Improper body mechanics, Postural dysfunction, Impaired flexibility, Increased fascial restricitons, Decreased strength  Visit Diagnosis: Pain in thoracic spine  Muscle weakness (generalized)  Other muscle spasm     Problem List Patient Active Problem List   Diagnosis Date Noted  . Excessive postexertional fatigue 04/02/2017  . Pneumonia due to virus 04/02/2017  . Chronic interstitial cystitis 02/01/2017  . Acute  pharyngitis 01/02/2017  . Dysphagia   . Esophageal reflux   . Allergic rhinitis 01/25/2016  . Shortness of breath   . Left hip pain 04/30/2015  . Sciatica 04/30/2015  . Knee pain, right 04/30/2015  . Leukocytes in urine 04/30/2015  . Hyperlipidemia 03/01/2015  . Bilateral hand numbness 03/01/2015  . Pulmonary nodules 02/12/2015  . External hemorrhoids 02/05/2015  . Chronic night sweats 01/08/2015  . Atypical chest pain 01/01/2015  . Chest pain 01/01/2015  . Renal insufficiency 07/16/2014  . Sepsis secondary to UTI ([place]) 02/05/2014  . Grief reaction 12/29/2013  . Urinary hesitancy 12/04/2013  . Abdominal pain, other specified site 10/26/2013  . Headache(784.0) 10/13/2013  . Pain, joint, multiple sites 08/18/2013  . Vasculitis ([place]) 07/29/2013  . ANA positive 07/29/2013  . Other and unspecified hyperlipidemia 07/12/2013  . Contusion, chest wall 04/30/2013  . Malignant neoplasm of lower-outer quadrant of right breast of female, estrogen receptor positive ([place]) 01/03/2013  . Splenic lesion 09/02/2012  . Asthma, allergic 08/05/2012  . GERD (gastroesophageal reflux disease) 06/03/2012  . Former heavy tobacco smoker 06/03/2012  . Edema 04/08/2012  . Stricture and stenosis of esophagus 10/19/2011  . Constipation - functional 07/21/2011  . Nonspecific (abnormal) findings on radiological and other examination of biliary tract 07/21/2011  . Osteoporosis 04/17/2011  . POSTMENOPAUSAL STATUS 01/27/2011  . FIBROCYSTIC BREAST DISEASE, HX OF 10/18/2010  . Essential hypertension  08/25/2010  . CAD in native artery 08/25/2010  . TOBACCO ABUSE, HX OF 08/25/2010  . GENERALIZED ANXIETY DISORDER 08/03/2010  . FIBROMYALGIA 01/11/2010  . SKIN CANCER, HX OF 01/11/2010      Laureen Abrahams, PT, DPT 08/30/18 12:52 PM    [facility] [place] [facility] [place], [place], [address] Phone: [phone]   Fax:  [phone]  Name: Sky Primo MRN: [id number] Date of Birth: Jun 21, 1946

## 2018-08-30 NOTE — Patient Instructions (Signed)
Access Code: [email or web address]  URL: [email or web address]  Date: 08/30/2018  Prepared by: Faustino Congress   Exercises  Supine Lower Trunk Rotation - 3 reps - 1 sets - 30 sec hold - 2x daily - 7x weekly  Supine Piriformis Stretch with Foot on Ground - 3 reps - 1 sets - 30 sec hold - 2x daily - 7x weekly  Supine Single Knee to Chest Stretch - 3 reps - 1 sets - 30 sec hold - 2x daily - 7x weekly  Supine Posterior Pelvic Tilt - 10 reps - 1-2 sets - 5 sec hold - 2x daily - 7x weekly  Bent Knee Fallouts - 10 reps - 1-2 sets - 2x daily - 7x weekly  Supine March - 10 reps - 1-2 sets - 2x daily - 7x weekly  Supine Heel Slides - 10 reps - 1-2 sets - 2x daily - 7x weekly  Patient Education  Trigger Point Dry Needling

## 2018-09-02 ENCOUNTER — Ambulatory Visit (INDEPENDENT_AMBULATORY_CARE_PROVIDER_SITE_OTHER): Payer: Medicare Other | Admitting: Physical Therapy

## 2018-09-02 ENCOUNTER — Encounter: Payer: Self-pay | Admitting: Physical Therapy

## 2018-09-02 DIAGNOSIS — M546 Pain in thoracic spine: Secondary | ICD-10-CM | POA: Diagnosis not present

## 2018-09-02 DIAGNOSIS — M62838 Other muscle spasm: Secondary | ICD-10-CM | POA: Diagnosis not present

## 2018-09-02 DIAGNOSIS — M6281 Muscle weakness (generalized): Secondary | ICD-10-CM

## 2018-09-02 NOTE — Therapy (Signed)
[name] [place], [place], [address] Phone: [phone]   Fax:  [phone]  Physical Therapy Treatment  Patient Details  Name: [name] MRN: [id number] Date of Birth: [birth year] Referring Provider: Dr. Melina Schools   Encounter Date: 09/02/2018  PT End of Session - 09/02/18 1105    Visit Number  3    Number of Visits  12    Date for PT Re-Evaluation  10/09/18    Authorization Type  [employer]    PT Start Time  1105    PT Stop Time  1155   MHP last 10 min    PT Time Calculation (min)  50 min       Past Medical History:  Diagnosis Date  . Allergy   . Anemia   . Anxiety    past hx   . Arthritis   . Asthma   . Atrial flutter ([place])    past history- not current  . CAD (coronary artery disease) CARDIOLOGIST--  DR Angelena Form   mild non-obstructive cad  . Cancer ([place])    right  . Cataract    bilaterally removed   . Chronic constipation   . Chronic kidney disease    interstitial cystitis  . COPD (chronic obstructive pulmonary disease) ([place])   . Depression    past hx   . Family history of malignant hyperthermia    father had this  . Fibromyalgia   . Frequency of urination   . GERD (gastroesophageal reflux disease)   . H/O hiatal hernia   . History of basal cell carcinoma excision    X2  . History of breast cancer ONCOLOGIST-- DR Jana Hakim---  NO RECURRANCE   DX 07/2012;  LOW GRADE DCIS  ER+PR+  ----  S/P RIGHT LUMPECTOMY WITH NEGATIVE MARGINS/   RADIATION ENDED 11/2012  . History of chronic bronchitis   . History of colonic polyps   . History of tachycardia    CONTROLLED  WITH ATENOLOL  . Hyperlipidemia   . Hypertension   . Neuromuscular disorder (HCC)    fibromyalgia  . Osteoporosis   . Pelvic pain   . Personal history of radiation therapy   . S/P radiation therapy 11/12/12 - 12/05/12   right Breast  . Sepsis ([place]) 2014   from UTI   . Sinus headache   . Urgency of urination      Past Surgical History:  Procedure Laterality Date  . [place] STUDY N/A 04/03/2016   Procedure: [place] STUDY;  Surgeon: Manus Gunning, MD;  Location: [facility];  Service: Gastroenterology;  Laterality: N/A;  . BREAST LUMPECTOMY Right 10-11-2012   W/ SLN BX  . CARDIAC CATHETERIZATION  09-13-2007  DR Lia Foyer   WELL-PRESERVED LVF/  DIFFUSE SCATTERED CORONARY CALCIFACATION AND ATHEROSCLEROSIS WITHOUT OBSTRUCTION  . CARDIAC CATHETERIZATION  08-04-2010  DR Angelena Form   NON-OBSTRUCTIVE CAD/  pLAD 40%/  oLAD 30%/  mLAD 30%/  pRCA 30%/  EF 60%  . CARDIOVASCULAR STRESS TEST  06-18-2012  DR McALHANY   LOW RISK NUCLEAR STUDY/  SMALL FIXED AREA OF MODERATELY DECREASED UPTAKE IN ANTEROSEPTAL WALL WHICH MAY BE ARTIFACTUAL/  NO ISCHEMIA/  EF 68%  . COLONOSCOPY  09-29-2010  . CYSTOSCOPY    . CYSTOSCOPY WITH HYDRODISTENSION AND BIOPSY N/A 03/06/2014   Procedure: CYSTOSCOPY/HYDRODISTENSION/ INSTILATION OF MARCAINE AND PYRIDIUM;  Surgeon: Ailene Rud, MD;  Location: [facility];  Service: Urology;  Laterality: N/A;  . ESOPHAGEAL  MANOMETRY N/A 04/03/2016   Procedure: ESOPHAGEAL MANOMETRY (EM);  Surgeon: Manus Gunning, MD;  [facility];  Service: Gastroenterology;  Laterality: N/A;  . NASAL SINUS SURGERY  1985  . ORIF RIGHT ANKLE  FX  2006  . POLYPECTOMY    . REMOVAL VOCAL CORD CYST  FEB 2014  . RIGHT BREAST BX  08-23-2012  . RIGHT HAND SURGERY  X3  LAST ONE 2009   INCLUDES  ORIF RIGHT 5TH FINGER AND REVISION TWICE  . TONSILLECTOMY AND ADENOIDECTOMY  AGE 74  . TOTAL ABDOMINAL HYSTERECTOMY W/ BILATERAL SALPINGOOPHORECTOMY  1982   W/  APPENDECTOMY  . TRANSTHORACIC ECHOCARDIOGRAM  06-24-2012   GRADE I DIASTOLIC DYSFUNCTION/  EF 55-60%/  MILD MR  . UPPER GASTROINTESTINAL ENDOSCOPY      There were no vitals filed for this visit.  Subjective Assessment - 09/02/18 1109    Subjective  Pt reports she went to gym to work out on Saturday (10 min  eliptical, 10 min treadmill, 20 min bike), "I gotta get moving".  She has some questions on how to engage core correctly.     Patient Stated Goals  be able to perform toileting without pain, sleep better at night    Currently in Pain?  Yes    Pain Score  7     Pain Location  Back    Pain Orientation  Lower;Mid    Pain Descriptors / Indicators  Jabbing   "grabbing".    Aggravating Factors   bending, prolonged sitting/ standing, ironing    Pain Relieving Factors  repositioning, medication.        [place] Adult PT Treatment/Exercise - 09/02/18 0001      Lumbar Exercises: Stretches   Double Knee to Chest Stretch  2 reps;30 seconds    Lower Trunk Rotation  3 reps;30 seconds   changed to single knee, with assist from UE   Quadruped Mid Back Stretch  2 reps   cat/cow   Piriformis Stretch  Right;Left;3 reps;30 seconds    Piriformis Stretch Limitations  multiple cues for proper form.     Other Lumbar Stretch Exercise  childs pose 5 sec, 2 reps, then with lateral flexion      Lumbar Exercises: Aerobic   Tread Mill  2.0 mph x 5 min       Lumbar Exercises: Supine   Ab Set  5 reps;5 seconds    Clam  10 reps    Clam Limitations  with ab set, each leg    Heel Slides  10 reps    Heel Slides Limitations  with ab set, each leg    Bent Knee Raise  5 reps    Bent Knee Raise Limitations  with ab set,  each leg    Bridge  10 reps;5 seconds      Modalities   Modalities  Moist Heat      Moist Heat Therapy   Number Minutes Moist Heat  10 Minutes    Moist Heat Location  Lumbar Spine   lower thoracic            PT Education - 09/02/18 1148    Education Details  HEP - added strertches and bridges    Person(s) Educated  Patient    Methods  Explanation;Demonstration;Handout;Tactile cues;Verbal cues    Comprehension  Verbalized understanding          PT Long Term Goals - 09/02/18 1135      PT LONG TERM GOAL #1  Title  independent with HEP    Time  6    Period  Weeks    Status   On-going      PT LONG TERM GOAL #2   Title  FOTO score improved to </= 55% limited for improved function    Time  6    Period  Weeks    Status  On-going      PT LONG TERM GOAL #3   Title  perform thoracolumbar flexion without increase in pain    Time  6    Period  Weeks    Status  On-going      PT LONG TERM GOAL #4   Title  demonstrate at least 4/5 bil LE strength for improved function    Time  6    Period  Weeks    Status  On-going            Plan - 09/02/18 1136    Clinical Impression Statement  Pt initially reporting cramping/increased pain in abdominals with TA contraction; improved with cues to decrease contraction.  Pt required some repeated tactile cues/demo for improved form of exercise. Pt tolerated all exercises well, reporting decrease in pain to 3/10 during exercise.   Progressing towards goals.     Rehab Potential  Good    PT Frequency  2x / week    PT Duration  6 weeks    PT Treatment/Interventions  ADLs/Self Care Home Management;Cryotherapy;Electrical Stimulation;Moist Heat;Traction;Ultrasound;Stair training;Functional mobility training;Therapeutic activities;Therapeutic exercise;Patient/family education;Neuromuscular re-education;Manual techniques;Dry needling    PT Next Visit Plan  continue hip/core strengthening.      Consulted and Agree with Plan of Care  Patient       Patient will benefit from skilled therapeutic intervention in order to improve the following deficits and impairments:  Difficulty walking, Decreased mobility, Increased muscle spasms, Improper body mechanics, Postural dysfunction, Impaired flexibility, Increased fascial restricitons, Decreased strength  Visit Diagnosis: Pain in thoracic spine  Muscle weakness (generalized)  Other muscle spasm     Problem List Patient Active Problem List   Diagnosis Date Noted  . Excessive postexertional fatigue 04/02/2017  . Pneumonia due to virus 04/02/2017  . Chronic interstitial cystitis  02/01/2017  . Acute pharyngitis 01/02/2017  . Dysphagia   . Esophageal reflux   . Allergic rhinitis 01/25/2016  . Shortness of breath   . Left hip pain 04/30/2015  . Sciatica 04/30/2015  . Knee pain, right 04/30/2015  . Leukocytes in urine 04/30/2015  . Hyperlipidemia 03/01/2015  . Bilateral hand numbness 03/01/2015  . Pulmonary nodules 02/12/2015  . External hemorrhoids 02/05/2015  . Chronic night sweats 01/08/2015  . Atypical chest pain 01/01/2015  . Chest pain 01/01/2015  . Renal insufficiency 07/16/2014  . Sepsis secondary to UTI ([place]) 02/05/2014  . Grief reaction 12/29/2013  . Urinary hesitancy 12/04/2013  . Abdominal pain, other specified site 10/26/2013  . Headache(784.0) 10/13/2013  . Pain, joint, multiple sites 08/18/2013  . Vasculitis ([place]) 07/29/2013  . ANA positive 07/29/2013  . Other and unspecified hyperlipidemia 07/12/2013  . Contusion, chest wall 04/30/2013  . Malignant neoplasm of lower-outer quadrant of right breast of female, estrogen receptor positive ([place]) 01/03/2013  . Splenic lesion 09/02/2012  . Asthma, allergic 08/05/2012  . GERD (gastroesophageal reflux disease) 06/03/2012  . Former heavy tobacco smoker 06/03/2012  . Edema 04/08/2012  . Stricture and stenosis of esophagus 10/19/2011  . Constipation - functional 07/21/2011  . Nonspecific (abnormal) findings on radiological and other examination of biliary tract 07/21/2011  .  Osteoporosis 04/17/2011  . POSTMENOPAUSAL STATUS 01/27/2011  . FIBROCYSTIC BREAST DISEASE, HX OF 10/18/2010  . Essential hypertension 08/25/2010  . CAD in native artery 08/25/2010  . TOBACCO ABUSE, HX OF 08/25/2010  . GENERALIZED ANXIETY DISORDER 08/03/2010  . FIBROMYALGIA 01/11/2010  . SKIN CANCER, HX OF 01/11/2010   Kerin Perna, PTA 09/02/18 11:51 AM  [place], [place], [address] Phone: [phone]   Fax:   [phone]  Name: [name] MRN: [id number] Date of Birth: Nov 24, 1946

## 2018-09-02 NOTE — Patient Instructions (Signed)
Therapeutic - Bridging    Lift buttocks, keeping back straight and arms on floor. Hold __3-5__ seconds. Repeat _5-10___ times.  Child Pose    Sitting on knees, fold body over legs and relax head and arms on floor. Hold for _3___ breaths.  Cat / Cow Flow    Inhale, press spine toward ceiling like a Halloween cat. Keeping strength in arms and abdominals, exhale to soften spine through neutral and into cow pose. Open chest and arch back. Initiate movement between cat and cow at tailbone, one vertebrae at a time. Repeat __3__ times.  Copyright  VHI. All rights reserved.

## 2018-09-02 NOTE — Progress Notes (Signed)
HPI: FU CAD.Cardiac catheterization in August 2011 showed a 40% proximal LAD, 30% mid LAD, 30% RCA and her ejection fraction was 60%. Nuclear study in June 2013 showed no ischemia with an ejection fraction of 68%. Echocardiogram July 2013 showed normal LV function, grade1 diastolic dysfunction, mild mitral regurgitation, trace aortic insufficiency and mildly elevated pulmonary pressures. Nuclear study March 2016 showed ejection fraction 67%. Breast attenuation but no ischemia.MRA of the headOctober 2018 normal. Renal dopplers 1/19 showed no RAS. Since last seen  there is no dyspnea on exertion, orthopnea, PND, pedal edema, exertional chest pain or syncope.  Current Outpatient Medications  Medication Sig Dispense Refill  . acetaminophen (TYLENOL) 500 MG tablet Take 1,000 mg by mouth every 6 (six) hours as needed for mild pain or headache.     . albuterol (PROVENTIL HFA;VENTOLIN HFA) 108 (90 BASE) MCG/ACT inhaler Inhale 2 puffs into the lungs every 4 (four) hours as needed. 1 Inhaler 5  . amLODipine (NORVASC) 5 MG tablet Take 1 tablet (5 mg total) by mouth daily. (Patient taking differently: Take 5 mg by mouth every evening. ) 90 tablet 3  . aspirin EC 81 MG tablet Take 1 tablet (81 mg total) by mouth daily. 90 tablet 3  . atenolol (TENORMIN) 25 MG tablet Take 1 tablet (25 mg total) by mouth 3 (three) times daily before meals. (Patient taking differently: Take 25 mg by mouth 2 (two) times daily. ) 90 tablet 11  . DULoxetine (CYMBALTA) 60 MG capsule TAKE 1 BY MOUTH TWICE DAILY 180 capsule 1  . Estradiol 10 MCG TABS vaginal tablet Place 1 tablet (10 mcg total) 2 (two) times a week vaginally. 8 tablet 11  . Evolocumab (REPATHA SURECLICK) 409 MG/ML SOAJ Inject 140 mg into the skin every 14 (fourteen) days. 1 pen 12  . fluticasone (FLONASE) 50 MCG/ACT nasal spray Place 1 spray into the nose as needed for allergies.     . furosemide (LASIX) 20 MG tablet Take 20 mg by mouth as needed for edema.      . gabapentin (NEURONTIN) 300 MG capsule Take 1 capsule (300 mg total) by mouth 2 (two) times daily. 60 capsule 6  . loratadine (CLARITIN) 10 MG tablet Take 1 tablet (10 mg total) by mouth daily. 30 tablet 11  . omeprazole (PRILOSEC) 40 MG capsule Take 40 mg by mouth 2 (two) times daily.    . pentosan polysulfate (ELMIRON) 100 MG capsule Take 1 capsule (100 mg total) by mouth 3 (three) times daily. Reported on 01/05/2016 (Patient taking differently: Take 100 mg by mouth 3 (three) times daily with meals as needed. Reported on 01/05/2016) 90 capsule 11  . traMADol (ULTRAM) 50 MG tablet Take 1-2 tablets (50-100 mg total) by mouth 3 (three) times daily as needed for moderate pain. (Patient taking differently: Take 50-100 mg by mouth 3 (three) times daily as needed for moderate pain (pt uses TID). ) 180 tablet 0  . traZODone (DESYREL) 100 MG tablet Take 1 tablet (100 mg total) by mouth at bedtime. 180 tablet 1  . losartan (COZAAR) 100 MG tablet Take 1 tablet (100 mg total) by mouth daily. 90 tablet 3   Current Facility-Administered Medications  Medication Dose Route Frequency Provider Last Rate Last Dose  . 0.9 %  sodium chloride infusion  500 mL Intravenous Continuous Armbruster, Carlota Raspberry, MD       Facility-Administered Medications Ordered in Other Visits  Medication Dose Route Frequency Provider Last Rate Last Dose  .  bupivacaine (MARCAINE) 0.5 % 10 mL, triamcinolone acetonide (KENALOG-40) 40 mg injection   Subcutaneous Once Carolan Clines, MD      . bupivacaine (MARCAINE) 0.5 % 15 mL, phenazopyridine (PYRIDIUM) 400 mg bladder mixture   Bladder Instillation Once Carolan Clines, MD         Past Medical History:  Diagnosis Date  . Allergy   . Anemia   . Anxiety    past hx   . Arthritis   . Asthma   . Atrial flutter ([place])    past history- not current  . CAD (coronary artery disease) CARDIOLOGIST--  DR Angelena Form   mild non-obstructive cad  . Cancer ([place])    right  . Cataract     bilaterally removed   . Chronic constipation   . Chronic kidney disease    interstitial cystitis  . COPD (chronic obstructive pulmonary disease) ([place])   . Depression    past hx   . Family history of malignant hyperthermia    father had this  . Fibromyalgia   . Frequency of urination   . GERD (gastroesophageal reflux disease)   . H/O hiatal hernia   . History of basal cell carcinoma excision    X2  . History of breast cancer ONCOLOGIST-- DR Jana Hakim---  NO RECURRANCE   DX 07/2012;  LOW GRADE DCIS  ER+PR+  ----  S/P RIGHT LUMPECTOMY WITH NEGATIVE MARGINS/   RADIATION ENDED 11/2012  . History of chronic bronchitis   . History of colonic polyps   . History of tachycardia    CONTROLLED  WITH ATENOLOL  . Hyperlipidemia   . Hypertension   . Neuromuscular disorder (HCC)    fibromyalgia  . Osteoporosis   . Pelvic pain   . Personal history of radiation therapy   . S/P radiation therapy 11/12/12 - 12/05/12   right Breast  . Sepsis ([place]) 2014   from UTI   . Sinus headache   . Urgency of urination     Past Surgical History:  Procedure Laterality Date  . [place] STUDY N/A 04/03/2016   Procedure: [place] STUDY;  Surgeon: Manus Gunning, MD;  [facility];  Service: Gastroenterology;  Laterality: N/A;  . BREAST LUMPECTOMY Right 10-11-2012   W/ SLN BX  . CARDIAC CATHETERIZATION  09-13-2007  DR Lia Foyer   WELL-PRESERVED LVF/  DIFFUSE SCATTERED CORONARY CALCIFACATION AND ATHEROSCLEROSIS WITHOUT OBSTRUCTION  . CARDIAC CATHETERIZATION  08-04-2010  DR Angelena Form   NON-OBSTRUCTIVE CAD/  pLAD 40%/  oLAD 30%/  mLAD 30%/  pRCA 30%/  EF 60%  . CARDIOVASCULAR STRESS TEST  06-18-2012  DR McALHANY   LOW RISK NUCLEAR STUDY/  SMALL FIXED AREA OF MODERATELY DECREASED UPTAKE IN ANTEROSEPTAL WALL WHICH MAY BE ARTIFACTUAL/  NO ISCHEMIA/  EF 68%  . COLONOSCOPY  09-29-2010  . CYSTOSCOPY    . CYSTOSCOPY WITH HYDRODISTENSION AND BIOPSY N/A 03/06/2014   Procedure:  CYSTOSCOPY/HYDRODISTENSION/ INSTILATION OF MARCAINE AND PYRIDIUM;  Surgeon: Ailene Rud, MD;  Location: [facility];  Service: Urology;  Laterality: N/A;  . ESOPHAGEAL MANOMETRY N/A 04/03/2016   Procedure: ESOPHAGEAL MANOMETRY (EM);  Surgeon: Manus Gunning, MD;  [facility];  Service: Gastroenterology;  Laterality: N/A;  . NASAL SINUS SURGERY  1985  . ORIF RIGHT ANKLE  FX  2006  . POLYPECTOMY    . REMOVAL VOCAL CORD CYST  FEB 2014  . RIGHT BREAST BX  08-23-2012  . RIGHT HAND SURGERY  X3  LAST ONE  2009   INCLUDES  ORIF RIGHT 5TH FINGER AND REVISION TWICE  . TONSILLECTOMY AND ADENOIDECTOMY  AGE 43  . TOTAL ABDOMINAL HYSTERECTOMY W/ BILATERAL SALPINGOOPHORECTOMY  1982   W/  APPENDECTOMY  . TRANSTHORACIC ECHOCARDIOGRAM  06-24-2012   GRADE I DIASTOLIC DYSFUNCTION/  EF 55-60%/  MILD MR  . UPPER GASTROINTESTINAL ENDOSCOPY      Social History   Socioeconomic History  . Marital status: Married    Spouse name: Jori Moll   . Number of children: 3  . Years of education: BA  . Highest education level: Not on file  Occupational History  . Occupation: Retired Programmer, multimedia: RETIRED  Social Needs  . Financial resource strain: Not on file  . Food insecurity:    Worry: Not on file    Inability: Not on file  . Transportation needs:    Medical: Not on file    Non-medical: Not on file  Tobacco Use  . Smoking status: Former Smoker    Packs/day: 1.00    Years: 15.00    Pack years: 15.00    Types: Cigarettes    Last attempt to quit: 05/27/2005    Years since quitting: 13.2  . Smokeless tobacco: Never Used  Substance and Sexual Activity  . Alcohol use: No    Alcohol/week: 0.0 standard drinks  . Drug use: No  . Sexual activity: Yes    Partners: Male    Comment: 1st intercourse 72 yo-Fewer than 5 partners  Lifestyle  . Physical activity:    Days per week: Not on file    Minutes per session: Not on file  . Stress: Not on file  Relationships  .  Social connections:    Talks on phone: Not on file    Gets together: Not on file    Attends religious service: Not on file    Active member of club or organization: Not on file    Attends meetings of clubs or organizations: Not on file    Relationship status: Not on file  . Intimate partner violence:    Fear of current or ex partner: Not on file    Emotionally abused: Not on file    Physically abused: Not on file    Forced sexual activity: Not on file  Other Topics Concern  . Not on file  Social History Narrative   Lives with husband.   Caffeine use: 1/2 cup per day   Exercise-- 2days a week [employer],  Water aerobics, walking     Family History  Problem Relation Age of Onset  . Rectal cancer Mother   . Colon cancer Mother   . Breast cancer Mother   . Pancreatic cancer Mother   . Breast cancer Maternal Aunt        breast  . Irritable bowel syndrome Son   . Irritable bowel syndrome Daughter   . Colon cancer Father   . Colon polyps Father   . Breast cancer Paternal Aunt   . Breast cancer Paternal Aunt   . Esophageal cancer Neg Hx   . Stomach cancer Neg Hx     ROS: back pain but no fevers or chills, productive cough, hemoptysis, dysphasia, odynophagia, melena, hematochezia, dysuria, hematuria, rash, seizure activity, orthopnea, PND, pedal edema, claudication. Remaining systems are negative.  Physical Exam: Well-developed well-nourished in no acute distress.  Skin is warm and dry.  HEENT is normal.  Neck is supple.  Chest is clear to auscultation with normal expansion.  Cardiovascular exam  is regular rate and rhythm.  Abdominal exam nontender or distended. No masses palpated. Extremities show no edema. neuro grossly intact  ECG-normal sinus rhythm at a rate of 70.  No ST changes.  Personally reviewed  A/P  1 coronary artery disease-plan to continue medical therapy with aspirin.  She is intolerant to statins.  2 hypertension-patient's blood pressure is controlled today.   Continue present regimen and follow.  Check potassium and renal function.  3 hyperlipidemia-she has been intolerant to statins.  She is now on Repatha.  Check lipids and liver.  4 chest pain-previous evaluations have been negative.  Will not pursue further ischemia work-up at this point.  Kirk Ruths, MD

## 2018-09-03 ENCOUNTER — Encounter: Payer: Self-pay | Admitting: Cardiology

## 2018-09-03 ENCOUNTER — Ambulatory Visit: Payer: Medicare Other | Admitting: Cardiology

## 2018-09-03 VITALS — BP 136/76 | HR 70 | Ht 64.0 in | Wt 129.0 lb

## 2018-09-03 DIAGNOSIS — I10 Essential (primary) hypertension: Secondary | ICD-10-CM | POA: Diagnosis not present

## 2018-09-03 DIAGNOSIS — Z79899 Other long term (current) drug therapy: Secondary | ICD-10-CM | POA: Diagnosis not present

## 2018-09-03 DIAGNOSIS — E7800 Pure hypercholesterolemia, unspecified: Secondary | ICD-10-CM

## 2018-09-03 DIAGNOSIS — I2510 Atherosclerotic heart disease of native coronary artery without angina pectoris: Secondary | ICD-10-CM | POA: Diagnosis not present

## 2018-09-03 NOTE — Patient Instructions (Addendum)
Medication Instructions: Your physician recommends that you continue on your current medications as directed.    If you need a refill on your cardiac medications before your next appointment, please call your pharmacy.   Labwork: Your physician recommends that you return for lab work in: Today ( BMP, Lipid, LFt)   Procedures/Testing: None  Follow-Up: Your physician recommends that you schedule a follow-up appointment first available with lipid clinic( Pharm D)     Your physician wants you to follow-up in 1 year with Dr. Trellis Moment will receive a reminder letter in the mail two months in advance. If you don't receive a letter, please call our office at [phone] to schedule this follow-up appointment.   Special Instructions:    Thank you for choosing [facility] at [facility]!!

## 2018-09-04 ENCOUNTER — Encounter: Payer: Medicare Other | Admitting: Physical Therapy

## 2018-09-04 LAB — BASIC METABOLIC PANEL
BUN/Creatinine Ratio: 15 (ref 12–28)
BUN: 15 mg/dL (ref 8–27)
CHLORIDE: 102 mmol/L (ref 96–106)
CO2: 25 mmol/L (ref 20–29)
Calcium: 8.9 mg/dL (ref 8.7–10.3)
Creatinine, Ser: 0.98 mg/dL (ref 0.57–1.00)
GFR calc Af Amer: 66 mL/min/{1.73_m2} (ref >59)
GFR calc non Af Amer: 57 mL/min/{1.73_m2} — ABNORMAL LOW (ref >59)
GLUCOSE: 79 mg/dL (ref 65–99)
Potassium: 4.6 mmol/L (ref 3.5–5.2)
Sodium: 139 mmol/L (ref 134–144)

## 2018-09-04 LAB — HEPATIC FUNCTION PANEL
ALBUMIN: 4 g/dL (ref 3.5–4.8)
ALT: 12 IU/L (ref 0–32)
AST: 19 IU/L (ref 0–40)
Alkaline Phosphatase: 101 IU/L (ref 39–117)
BILIRUBIN TOTAL: 0.3 mg/dL (ref 0.0–1.2)
BILIRUBIN, DIRECT: 0.12 mg/dL (ref 0.00–0.40)
Total Protein: 6.4 g/dL (ref 6.0–8.5)

## 2018-09-04 LAB — LIPID PANEL
Chol/HDL Ratio: 3 ratio (ref 0.0–4.4)
Cholesterol, Total: 156 mg/dL (ref 100–199)
HDL: 52 mg/dL (ref >39)
LDL Calculated: 85 mg/dL (ref 0–99)
TRIGLYCERIDES: 95 mg/dL (ref 0–149)
VLDL Cholesterol Cal: 19 mg/dL (ref 5–40)

## 2018-09-05 ENCOUNTER — Ambulatory Visit (INDEPENDENT_AMBULATORY_CARE_PROVIDER_SITE_OTHER): Payer: Medicare Other | Admitting: Physical Therapy

## 2018-09-05 ENCOUNTER — Encounter: Payer: Self-pay | Admitting: Physical Therapy

## 2018-09-05 DIAGNOSIS — M6281 Muscle weakness (generalized): Secondary | ICD-10-CM

## 2018-09-05 DIAGNOSIS — M62838 Other muscle spasm: Secondary | ICD-10-CM | POA: Diagnosis not present

## 2018-09-05 DIAGNOSIS — M546 Pain in thoracic spine: Secondary | ICD-10-CM | POA: Diagnosis not present

## 2018-09-05 NOTE — Patient Instructions (Signed)
   Cardio Equipment (Goal is 30-45 minutes)  1. Recumbent Bike  2. Seated Stepper  3. Treadmill  4. Elliptical   Machines for Strengthening (Goal is 2-3 sets of 10-15 reps)  1. Knee Extension  2. Hamstring Curls  3. Hip Abduction  4. Hip Adduction  5. Hip Extension  6. Leg Press  7. Rows  8.  Bicep Curls  9. Tricep Curls

## 2018-09-05 NOTE — Therapy (Signed)
[place], [place], [address] Phone: [phone]   Fax:  [phone]  Physical Therapy Treatment  Patient Details  Name: [name] MRN: [id number] Date of Birth: 05-22-1946 Referring Provider: Dr. Melina Schools   Encounter Date: 09/05/2018  PT End of Session - 09/05/18 1031    Visit Number  4    Number of Visits  12    Date for PT Re-Evaluation  10/09/18    Authorization Type  [employer]    PT Start Time  0930    PT Stop Time  1012    PT Time Calculation (min)  42 min    Activity Tolerance  Patient tolerated treatment well    Behavior During Therapy  [facility] for tasks assessed/performed       Past Medical History:  Diagnosis Date  . Allergy   . Anemia   . Anxiety    past hx   . Arthritis   . Asthma   . Atrial flutter ([place])    past history- not current  . CAD (coronary artery disease) CARDIOLOGIST--  DR Angelena Form   mild non-obstructive cad  . Cancer ([place])    right  . Cataract    bilaterally removed   . Chronic constipation   . Chronic kidney disease    interstitial cystitis  . COPD (chronic obstructive pulmonary disease) ([place])   . Depression    past hx   . Family history of malignant hyperthermia    father had this  . Fibromyalgia   . Frequency of urination   . GERD (gastroesophageal reflux disease)   . H/O hiatal hernia   . History of basal cell carcinoma excision    X2  . History of breast cancer ONCOLOGIST-- DR Jana Hakim---  NO RECURRANCE   DX 07/2012;  LOW GRADE DCIS  ER+PR+  ----  S/P RIGHT LUMPECTOMY WITH NEGATIVE MARGINS/   RADIATION ENDED 11/2012  . History of chronic bronchitis   . History of colonic polyps   . History of tachycardia    CONTROLLED  WITH ATENOLOL  . Hyperlipidemia   . Hypertension   . Neuromuscular disorder (HCC)    fibromyalgia  . Osteoporosis   . Pelvic pain   . Personal history of radiation therapy   . S/P radiation therapy 11/12/12 - 12/05/12    right Breast  . Sepsis ([place]) 2014   from UTI   . Sinus headache   . Urgency of urination     Past Surgical History:  Procedure Laterality Date  . [place] STUDY N/A 04/03/2016   Procedure: [name] STUDY;  Surgeon: Manus Gunning, MD;  Location: [facility];  Service: Gastroenterology;  Laterality: N/A;  . BREAST LUMPECTOMY Right 10-11-2012   W/ SLN BX  . CARDIAC CATHETERIZATION  09-13-2007  DR Lia Foyer   WELL-PRESERVED LVF/  DIFFUSE SCATTERED CORONARY CALCIFACATION AND ATHEROSCLEROSIS WITHOUT OBSTRUCTION  . CARDIAC CATHETERIZATION  08-04-2010  DR Angelena Form   NON-OBSTRUCTIVE CAD/  pLAD 40%/  oLAD 30%/  mLAD 30%/  pRCA 30%/  EF 60%  . CARDIOVASCULAR STRESS TEST  06-18-2012  DR McALHANY   LOW RISK NUCLEAR STUDY/  SMALL FIXED AREA OF MODERATELY DECREASED UPTAKE IN ANTEROSEPTAL WALL WHICH MAY BE ARTIFACTUAL/  NO ISCHEMIA/  EF 68%  . COLONOSCOPY  09-29-2010  . CYSTOSCOPY    . CYSTOSCOPY WITH HYDRODISTENSION AND BIOPSY N/A 03/06/2014   Procedure: CYSTOSCOPY/HYDRODISTENSION/ INSTILATION OF MARCAINE AND PYRIDIUM;  Surgeon: Ailene Rud, MD;  Location: [facility];  Service: Urology;  Laterality: N/A;  . ESOPHAGEAL MANOMETRY N/A 04/03/2016   Procedure: ESOPHAGEAL MANOMETRY (EM);  Surgeon: Manus Gunning, MD;  Location: [facility];  Service: Gastroenterology;  Laterality: N/A;  . NASAL SINUS SURGERY  1985  . ORIF RIGHT ANKLE  FX  2006  . POLYPECTOMY    . REMOVAL VOCAL CORD CYST  FEB 2014  . RIGHT BREAST BX  08-23-2012  . RIGHT HAND SURGERY  X3  LAST ONE 2009   INCLUDES  ORIF RIGHT 5TH FINGER AND REVISION TWICE  . TONSILLECTOMY AND ADENOIDECTOMY  AGE 36  . TOTAL ABDOMINAL HYSTERECTOMY W/ BILATERAL SALPINGOOPHORECTOMY  1982   W/  APPENDECTOMY  . TRANSTHORACIC ECHOCARDIOGRAM  06-24-2012   GRADE I DIASTOLIC DYSFUNCTION/  EF 55-60%/  MILD MR  . UPPER GASTROINTESTINAL ENDOSCOPY      There were no vitals filed for this visit.  Subjective Assessment  - 09/05/18 0931    Subjective  "something is hurting the back." points to lower back, thoracic spine is much better.  spent 2 hours in dentist office yesterday.  thinks she may be overdoing it at the gym.    Patient Stated Goals  be able to perform toileting without pain, sleep better at night    Currently in Pain?  Yes    Pain Score  7    mid/thoracic: 4/10   Pain Location  Back    Pain Orientation  Mid;Lower    Pain Descriptors / Indicators  Aching;Dull    Pain Onset  More than a month ago    Aggravating Factors   bending, prolonged sitting/standing, ironing    Pain Relieving Factors  repositioning, medication                       [facility] Adult PT Treatment/Exercise - 09/05/18 0934      Self-Care   Self-Care  Other Self-Care Comments    Other Self-Care Comments   educated on community fitness and gym program recommendations to decrease risk of burn out and over use      Exercises   Exercises  Lumbar      Lumbar Exercises: Stretches   Piriformis Stretch  Right;Left;3 reps;30 seconds    Other Lumbar Stretch Exercise  childs pose 3x15 sec; mid/Lt/Rt      Lumbar Exercises: Aerobic   Nustep  L5 x 8 min (4 extremities; PT present to discuss care)      Lumbar Exercises: Seated   Long Arc Quad on Chair  Right;Left;10 reps;Weights    LAQ on Chair Weights (lbs)  2      Lumbar Exercises: Supine   Bridge  10 reps;5 seconds    Bridge with Ball Squeeze  10 reps;5 seconds      Lumbar Exercises: Sidelying   Clam  Right;Left;10 reps      Lumbar Exercises: Quadruped   [place]/Old Horse  10 reps    Single Arm Raise  Right;Left;10 reps             PT Education - 09/05/18 1030    Education Details  see self care    Person(s) Educated  Patient    Methods  Explanation;Handout    Comprehension  Verbalized understanding          PT Long Term Goals - 09/05/18 1031      PT LONG TERM GOAL #1   Title  independent with HEP    Time  6  Period  Weeks    Status   On-going    Target Date  10/09/18      PT LONG TERM GOAL #2   Title  FOTO score improved to </= 55% limited for improved function    Time  6    Period  Weeks    Status  On-going    Target Date  10/09/18      PT LONG TERM GOAL #3   Title  perform thoracolumbar flexion without increase in pain    Time  6    Period  Weeks    Status  On-going    Target Date  10/09/18      PT LONG TERM GOAL #4   Title  demonstrate at least 4/5 bil LE strength for improved function    Time  6    Period  Weeks    Status  On-going    Target Date  10/09/18            Plan - 09/05/18 1031    Clinical Impression Statement  Pt tolerated session well and reports compliance with HEP and gym program.  Pt seems to be overdoing it at the gym (45-60 min of cardio and strength training) so educated on guidelines and gradual increase in activity.      Rehab Potential  Good    PT Frequency  2x / week    PT Duration  6 weeks    PT Treatment/Interventions  ADLs/Self Care Home Management;Cryotherapy;Electrical Stimulation;Moist Heat;Traction;Ultrasound;Stair training;Functional mobility training;Therapeutic activities;Therapeutic exercise;Patient/family education;Neuromuscular re-education;Manual techniques;Dry needling    PT Next Visit Plan  continue hip/core strengthening.  Progress mobility, DN/manual/modalities PRN (trying to wean from modalities and focus on exercise)    PT Home Exercise Plan  Access Code: [email or web address]     Consulted and Agree with Plan of Care  Patient       Patient will benefit from skilled therapeutic intervention in order to improve the following deficits and impairments:  Difficulty walking, Decreased mobility, Increased muscle spasms, Improper body mechanics, Postural dysfunction, Impaired flexibility, Increased fascial restricitons, Decreased strength  Visit Diagnosis: Pain in thoracic spine  Muscle weakness (generalized)  Other muscle spasm     Problem List Patient Active  Problem List   Diagnosis Date Noted  . Excessive postexertional fatigue 04/02/2017  . Pneumonia due to virus 04/02/2017  . Chronic interstitial cystitis 02/01/2017  . Acute pharyngitis 01/02/2017  . Dysphagia   . Esophageal reflux   . Allergic rhinitis 01/25/2016  . Shortness of breath   . Left hip pain 04/30/2015  . Sciatica 04/30/2015  . Knee pain, right 04/30/2015  . Leukocytes in urine 04/30/2015  . Hyperlipidemia 03/01/2015  . Bilateral hand numbness 03/01/2015  . Pulmonary nodules 02/12/2015  . External hemorrhoids 02/05/2015  . Chronic night sweats 01/08/2015  . Atypical chest pain 01/01/2015  . Chest pain 01/01/2015  . Renal insufficiency 07/16/2014  . Sepsis secondary to UTI ([place]) 02/05/2014  . Grief reaction 12/29/2013  . Urinary hesitancy 12/04/2013  . Abdominal pain, other specified site 10/26/2013  . Headache(784.0) 10/13/2013  . Pain, joint, multiple sites 08/18/2013  . Vasculitis ([place]) 07/29/2013  . ANA positive 07/29/2013  . Other and unspecified hyperlipidemia 07/12/2013  . Contusion, chest wall 04/30/2013  . Malignant neoplasm of lower-outer quadrant of right breast of female, estrogen receptor positive ([place]) 01/03/2013  . Splenic lesion 09/02/2012  . Asthma, allergic 08/05/2012  . GERD (gastroesophageal reflux disease) 06/03/2012  . Former heavy tobacco smoker 06/03/2012  .  Edema 04/08/2012  . Stricture and stenosis of esophagus 10/19/2011  . Constipation - functional 07/21/2011  . Nonspecific (abnormal) findings on radiological and other examination of biliary tract 07/21/2011  . Osteoporosis 04/17/2011  . POSTMENOPAUSAL STATUS 01/27/2011  . FIBROCYSTIC BREAST DISEASE, HX OF 10/18/2010  . Essential hypertension 08/25/2010  . CAD in native artery 08/25/2010  . TOBACCO ABUSE, HX OF 08/25/2010  . GENERALIZED ANXIETY DISORDER 08/03/2010  . FIBROMYALGIA 01/11/2010  . SKIN CANCER, HX OF 01/11/2010      Laureen Abrahams, PT, DPT 09/05/18 10:47  AM    [facility] [place], [place], [address] Phone: [phone]   Fax:  [phone]  Name: [name] MRN: [id number] Date of Birth: June 15, 1946

## 2018-09-09 ENCOUNTER — Encounter: Payer: Medicare Other | Admitting: Physical Therapy

## 2018-09-12 ENCOUNTER — Encounter: Payer: Medicare Other | Admitting: Physical Therapy

## 2018-09-12 ENCOUNTER — Ambulatory Visit (INDEPENDENT_AMBULATORY_CARE_PROVIDER_SITE_OTHER): Payer: Medicare Other | Admitting: Pharmacist Clinician (PhC)/ Clinical Pharmacy Specialist

## 2018-09-12 ENCOUNTER — Encounter: Payer: Self-pay | Admitting: Physical Therapy

## 2018-09-12 ENCOUNTER — Ambulatory Visit (INDEPENDENT_AMBULATORY_CARE_PROVIDER_SITE_OTHER): Payer: Medicare Other | Admitting: Physical Therapy

## 2018-09-12 ENCOUNTER — Telehealth: Payer: Self-pay | Admitting: *Deleted

## 2018-09-12 DIAGNOSIS — M546 Pain in thoracic spine: Secondary | ICD-10-CM | POA: Diagnosis not present

## 2018-09-12 DIAGNOSIS — E7800 Pure hypercholesterolemia, unspecified: Secondary | ICD-10-CM

## 2018-09-12 DIAGNOSIS — M62838 Other muscle spasm: Secondary | ICD-10-CM

## 2018-09-12 DIAGNOSIS — M6281 Muscle weakness (generalized): Secondary | ICD-10-CM

## 2018-09-12 NOTE — Progress Notes (Signed)
09/13/2018 [name] [birth year] [id number]   HPI:  [name] is a 72 y.o. female patient of Dr Stanford Breed, who presents today for a lipid clinic evaluation.  In addition to hyperlipidemia, her medical history is significant for hypertension and CAD with 30-40% blockages in the RDA, proximal and mid LAD.  She has been on Repatha for almost a year, doing well with it, but recently went into the donut hole and her price increased from $36 to $300 per month.  Her last dose was 4-5 weeks ago.    She also notes that she still tends to feel a little flu-like for a day or two after her dose.  Offered to have her sample Praluent to see if we could eliminate that effect, but she is happy with the overall results of Repatha and does not have any interest in switching.    Current Medications:  Repatha 140 mg q2wks - last dose 4-5 weeks ago  Cholesterol Goals:   LDL < 70 or >50% reduction.  Started at LDL of 169, now at 75 - exactly 50% reduction  Intolerant/previously tried:  Atorvastatin 20 mg, rosuvastatin 10 mg - both caused sever joint and muscle pains  Family history:   Father - 2 MI, first in his 1's, also with CHF - died at 36, was in need of transplant, but had too many       other issues, died from stroke post broken hip             Mother - hypertension, DM, hyperlipidemia - died at 23             1 sister with no health issues             3 children - 1 son with MV replacement at 36  Diet:   Mostly home cooked, plenty of salads, prefers vegetables to meats; beans; avoids gluten when able, but does not purposely avoid; no sweets, no chips  Labs:   08/2018:  TC 156; TG 95, HDL 52, LDL 85 (on Repatha - last dose 3 weeks ago)  10/2017:  TC 253, TG 165, HDL 51, LDL 169  Current Outpatient Medications  Medication Sig Dispense Refill  . acetaminophen (TYLENOL) 500 MG tablet Take 1,000 mg by mouth every 6 (six) hours as needed for mild pain or headache.     . albuterol (PROVENTIL  HFA;VENTOLIN HFA) 108 (90 BASE) MCG/ACT inhaler Inhale 2 puffs into the lungs every 4 (four) hours as needed. 1 Inhaler 5  . amLODipine (NORVASC) 5 MG tablet Take 1 tablet (5 mg total) by mouth daily. (Patient taking differently: Take 5 mg by mouth every evening. ) 90 tablet 3  . aspirin EC 81 MG tablet Take 1 tablet (81 mg total) by mouth daily. 90 tablet 3  . atenolol (TENORMIN) 25 MG tablet Take 1 tablet (25 mg total) by mouth 3 (three) times daily before meals. (Patient taking differently: Take 25 mg by mouth 2 (two) times daily. ) 90 tablet 11  . DULoxetine (CYMBALTA) 60 MG capsule TAKE 1 BY MOUTH TWICE DAILY 180 capsule 1  . Estradiol 10 MCG TABS vaginal tablet Place 1 tablet (10 mcg total) 2 (two) times a week vaginally. 8 tablet 11  . Evolocumab (REPATHA SURECLICK) 270 MG/ML SOAJ Inject 140 mg into the skin every 14 (fourteen) days. 1 pen 12  . fluticasone (FLONASE) 50 MCG/ACT nasal spray Place 1 spray into the nose as needed for allergies.     Marland Kitchen  furosemide (LASIX) 20 MG tablet Take 20 mg by mouth as needed for edema.    . gabapentin (NEURONTIN) 300 MG capsule Take 1 capsule (300 mg total) by mouth 2 (two) times daily. 60 capsule 6  . loratadine (CLARITIN) 10 MG tablet Take 1 tablet (10 mg total) by mouth daily. 30 tablet 11  . losartan (COZAAR) 100 MG tablet Take 1 tablet (100 mg total) by mouth daily. 90 tablet 3  . omeprazole (PRILOSEC) 40 MG capsule Take 40 mg by mouth 2 (two) times daily.    . pentosan polysulfate (ELMIRON) 100 MG capsule Take 1 capsule (100 mg total) by mouth 3 (three) times daily. Reported on 01/05/2016 (Patient taking differently: Take 100 mg by mouth 3 (three) times daily with meals as needed. Reported on 01/05/2016) 90 capsule 11  . traMADol (ULTRAM) 50 MG tablet Take 1-2 tablets (50-100 mg total) by mouth 3 (three) times daily as needed for moderate pain. (Patient taking differently: Take 50-100 mg by mouth 3 (three) times daily as needed for moderate pain (pt uses  TID). ) 180 tablet 0  . traZODone (DESYREL) 100 MG tablet Take 1 tablet (100 mg total) by mouth at bedtime. 180 tablet 1   Current Facility-Administered Medications  Medication Dose Route Frequency Provider Last Rate Last Dose  . 0.9 %  sodium chloride infusion  500 mL Intravenous Continuous Armbruster, Carlota Raspberry, MD       Facility-Administered Medications Ordered in Other Visits  Medication Dose Route Frequency Provider Last Rate Last Dose  . bupivacaine (MARCAINE) 0.5 % 10 mL, triamcinolone acetonide (KENALOG-40) 40 mg injection   Subcutaneous Once Carolan Clines, MD      . bupivacaine (MARCAINE) 0.5 % 15 mL, phenazopyridine (PYRIDIUM) 400 mg bladder mixture   Bladder Instillation Once Carolan Clines, MD        Allergies  Allergen Reactions  . Clindamycin Hcl Shortness Of Breath and Rash  . Penicillins Anaphylaxis  . Prednisone Shortness Of Breath and Rash  . Codeine Hives and Other (See Comments)    headache  . Fluzone [Flu Virus Vaccine] Other (See Comments)    Local reaction at the site  . Latex Hives  . Pentazocine Lactate Other (See Comments)    HALLUCINATION  . Pneumococcal Vaccine Polyvalent Hives, Swelling and Other (See Comments)    REACTION: redness, swelling, and hives at injection site  . Tamoxifen Nausea And Vomiting and Other (See Comments)    HEADACHE    Past Medical History:  Diagnosis Date  . Allergy   . Anemia   . Anxiety    past hx   . Arthritis   . Asthma   . Atrial flutter ([place])    past history- not current  . CAD (coronary artery disease) CARDIOLOGIST--  DR Angelena Form   mild non-obstructive cad  . Cancer ([place])    right  . Cataract    bilaterally removed   . Chronic constipation   . Chronic kidney disease    interstitial cystitis  . COPD (chronic obstructive pulmonary disease) ([place])   . Depression    past hx   . Family history of malignant hyperthermia    father had this  . Fibromyalgia   . Frequency of urination   . GERD  (gastroesophageal reflux disease)   . H/O hiatal hernia   . History of basal cell carcinoma excision    X2  . History of breast cancer ONCOLOGIST-- DR Jana Hakim---  NO RECURRANCE   DX 07/2012;  LOW GRADE  DCIS  ER+PR+  ----  S/P RIGHT LUMPECTOMY WITH NEGATIVE MARGINS/   RADIATION ENDED 11/2012  . History of chronic bronchitis   . History of colonic polyps   . History of tachycardia    CONTROLLED  WITH ATENOLOL  . Hyperlipidemia   . Hypertension   . Neuromuscular disorder (HCC)    fibromyalgia  . Osteoporosis   . Pelvic pain   . Personal history of radiation therapy   . S/P radiation therapy 11/12/12 - 12/05/12   right Breast  . Sepsis ([place]) 2014   from UTI   . Sinus headache   . Urgency of urination     There were no vitals taken for this visit.   Hyperlipidemia Patient with hyperlipidemia, has done well with Repatha to date.  Gave two samples to patient today to help her decrease costs of donut hole.  She will let us know if she plans any changes to her Medicare Part D plan.  Will renew her PA at the end of the year so that she can continue into 2020.    Also gave patient names of PCP physicians in the [place] area, as she is currently looking for new provider.    Tommy Medal PharmD CPP [facility]

## 2018-09-12 NOTE — Telephone Encounter (Addendum)
Deductible Amount Met  OOP MAX $5900 ($1368mt)  Annual exam UPCOMING 11/05/18 TF  Calcium    8.9         Date 09/03/18  Upcoming dental procedures NO  Prior Authorization needed NO  Pt estimated Cost $215   APPT SAME DAY AS ANNUAL 11/05/18  Coverage Details:20% ONE DOSE

## 2018-09-12 NOTE — Therapy (Signed)
[place], [place], [address] Phone: [phone]   Fax:  [phone]  Physical Therapy Treatment  Patient Details  Name: [name] MRN: [id number] Date of Birth: 1946-05-20 Referring Provider: Dr. Melina Schools   Encounter Date: 09/12/2018  PT End of Session - 09/12/18 1528    Visit Number  5    Number of Visits  12    Date for PT Re-Evaluation  10/09/18    Authorization Type  [employer]    PT Start Time  1449    PT Stop Time  1528    PT Time Calculation (min)  39 min    Activity Tolerance  Patient tolerated treatment well    Behavior During Therapy  [facility] for tasks assessed/performed       Past Medical History:  Diagnosis Date  . Allergy   . Anemia   . Anxiety    past hx   . Arthritis   . Asthma   . Atrial flutter ([place])    past history- not current  . CAD (coronary artery disease) CARDIOLOGIST--  DR Angelena Form   mild non-obstructive cad  . Cancer ([place])    right  . Cataract    bilaterally removed   . Chronic constipation   . Chronic kidney disease    interstitial cystitis  . COPD (chronic obstructive pulmonary disease) ([place])   . Depression    past hx   . Family history of malignant hyperthermia    father had this  . Fibromyalgia   . Frequency of urination   . GERD (gastroesophageal reflux disease)   . H/O hiatal hernia   . History of basal cell carcinoma excision    X2  . History of breast cancer ONCOLOGIST-- DR Jana Hakim---  NO RECURRANCE   DX 07/2012;  LOW GRADE DCIS  ER+PR+  ----  S/P RIGHT LUMPECTOMY WITH NEGATIVE MARGINS/   RADIATION ENDED 11/2012  . History of chronic bronchitis   . History of colonic polyps   . History of tachycardia    CONTROLLED  WITH ATENOLOL  . Hyperlipidemia   . Hypertension   . Neuromuscular disorder (HCC)    fibromyalgia  . Osteoporosis   . Pelvic pain   . Personal history of radiation therapy   . S/P radiation therapy 11/12/12 - 12/05/12    right Breast  . Sepsis ([place]) 2014   from UTI   . Sinus headache   . Urgency of urination     Past Surgical History:  Procedure Laterality Date  . [place] STUDY N/A 04/03/2016   Procedure: [place] STUDY;  Surgeon: Manus Gunning, MD;  [facility];  Service: Gastroenterology;  Laterality: N/A;  . BREAST LUMPECTOMY Right 10-11-2012   W/ SLN BX  . CARDIAC CATHETERIZATION  09-13-2007  DR Lia Foyer   WELL-PRESERVED LVF/  DIFFUSE SCATTERED CORONARY CALCIFACATION AND ATHEROSCLEROSIS WITHOUT OBSTRUCTION  . CARDIAC CATHETERIZATION  08-04-2010  DR Angelena Form   NON-OBSTRUCTIVE CAD/  pLAD 40%/  oLAD 30%/  mLAD 30%/  pRCA 30%/  EF 60%  . CARDIOVASCULAR STRESS TEST  06-18-2012  DR McALHANY   LOW RISK NUCLEAR STUDY/  SMALL FIXED AREA OF MODERATELY DECREASED UPTAKE IN ANTEROSEPTAL WALL WHICH MAY BE ARTIFACTUAL/  NO ISCHEMIA/  EF 68%  . COLONOSCOPY  09-29-2010  . CYSTOSCOPY    . CYSTOSCOPY WITH HYDRODISTENSION AND BIOPSY N/A 03/06/2014   Procedure: CYSTOSCOPY/HYDRODISTENSION/ INSTILATION OF MARCAINE AND PYRIDIUM;  Surgeon: Ailene Rud, MD;  Location: [facility];  Service: Urology;  Laterality: N/A;  . ESOPHAGEAL MANOMETRY N/A 04/03/2016   Procedure: ESOPHAGEAL MANOMETRY (EM);  Surgeon: Manus Gunning, MD;  [facility];  Service: Gastroenterology;  Laterality: N/A;  . NASAL SINUS SURGERY  1985  . ORIF RIGHT ANKLE  FX  2006  . POLYPECTOMY    . REMOVAL VOCAL CORD CYST  FEB 2014  . RIGHT BREAST BX  08-23-2012  . RIGHT HAND SURGERY  X3  LAST ONE 2009   INCLUDES  ORIF RIGHT 5TH FINGER AND REVISION TWICE  . TONSILLECTOMY AND ADENOIDECTOMY  AGE 48  . TOTAL ABDOMINAL HYSTERECTOMY W/ BILATERAL SALPINGOOPHORECTOMY  1982   W/  APPENDECTOMY  . TRANSTHORACIC ECHOCARDIOGRAM  06-24-2012   GRADE I DIASTOLIC DYSFUNCTION/  EF 55-60%/  MILD MR  . UPPER GASTROINTESTINAL ENDOSCOPY      There were no vitals filed for this visit.  Subjective Assessment  - 09/12/18 1453    Subjective  having issues with her BP the past few days resulting in some headaches.  checked BP before PT: 133/90    Patient Stated Goals  be able to perform toileting without pain, sleep better at night    Currently in Pain?  Yes    Pain Score  5     Pain Location  Back    Pain Orientation  Mid;Lower    Pain Descriptors / Indicators  Aching;Dull    Pain Onset  More than a month ago    Pain Frequency  Constant    Aggravating Factors   bending, walking    Pain Relieving Factors  repositioning, medication                       [facility] Adult PT Treatment/Exercise - 09/12/18 1454      Exercises   Exercises  Lumbar      Lumbar Exercises: Stretches   Single Knee to Chest Stretch  Right;Left;3 reps;30 seconds    Lower Trunk Rotation  3 reps;30 seconds    Piriformis Stretch  Right;Left;3 reps;30 seconds      Lumbar Exercises: Aerobic   Nustep  L5 x 8 min (4 extremities; PT present to discuss care)      Lumbar Exercises: Standing   Heel Raises  20 reps    Row  Both;10 reps;Theraband    Theraband Level (Row)  Level 2 (Red)    Shoulder Extension  Both;10 reps;Theraband    Theraband Level (Shoulder Extension)  Level 2 (Red)    Other Standing Lumbar Exercises  hip abdct/ext x 10 reps bil      Lumbar Exercises: Seated   Sit to Stand  10 reps   mod cues for form     Lumbar Exercises: Supine   Bridge  10 reps;5 seconds                  PT Long Term Goals - 09/05/18 1031      PT LONG TERM GOAL #1   Title  independent with HEP    Time  6    Period  Weeks    Status  On-going    Target Date  10/09/18      PT LONG TERM GOAL #2   Title  FOTO score improved to </= 55% limited for improved function    Time  6    Period  Weeks    Status  On-going    Target Date  10/09/18  PT LONG TERM GOAL #3   Title  perform thoracolumbar flexion without increase in pain    Time  6    Period  Weeks    Status  On-going    Target Date  10/09/18       PT LONG TERM GOAL #4   Title  demonstrate at least 4/5 bil LE strength for improved function    Time  6    Period  Weeks    Status  On-going    Target Date  10/09/18            Plan - 09/12/18 1529    Clinical Impression Statement  Pt overall reporting decreased pain and improved function.  Progressing well with PT, no goals met at this time.  Reporting compliance with HEP and community fitness program.    Rehab Potential  Good    PT Frequency  2x / week    PT Duration  6 weeks    PT Treatment/Interventions  ADLs/Self Care Home Management;Cryotherapy;Electrical Stimulation;Moist Heat;Traction;Ultrasound;Stair training;Functional mobility training;Therapeutic activities;Therapeutic exercise;Patient/family education;Neuromuscular re-education;Manual techniques;Dry needling    PT Next Visit Plan  continue hip/core strengthening.  Progress mobility, DN/manual/modalities PRN (trying to wean from modalities and focus on exercise)    PT Home Exercise Plan  Access Code: [email or web address]     Consulted and Agree with Plan of Care  Patient       Patient will benefit from skilled therapeutic intervention in order to improve the following deficits and impairments:  Difficulty walking, Decreased mobility, Increased muscle spasms, Improper body mechanics, Postural dysfunction, Impaired flexibility, Increased fascial restricitons, Decreased strength  Visit Diagnosis: Pain in thoracic spine  Muscle weakness (generalized)  Other muscle spasm     Problem List Patient Active Problem List   Diagnosis Date Noted  . Excessive postexertional fatigue 04/02/2017  . Pneumonia due to virus 04/02/2017  . Chronic interstitial cystitis 02/01/2017  . Acute pharyngitis 01/02/2017  . Dysphagia   . Esophageal reflux   . Allergic rhinitis 01/25/2016  . Shortness of breath   . Left hip pain 04/30/2015  . Sciatica 04/30/2015  . Knee pain, right 04/30/2015  . Leukocytes in urine 04/30/2015  .  Hyperlipidemia 03/01/2015  . Bilateral hand numbness 03/01/2015  . Pulmonary nodules 02/12/2015  . External hemorrhoids 02/05/2015  . Chronic night sweats 01/08/2015  . Atypical chest pain 01/01/2015  . Chest pain 01/01/2015  . Renal insufficiency 07/16/2014  . Sepsis secondary to UTI ([place]) 02/05/2014  . Grief reaction 12/29/2013  . Urinary hesitancy 12/04/2013  . Abdominal pain, other specified site 10/26/2013  . Headache(784.0) 10/13/2013  . Pain, joint, multiple sites 08/18/2013  . Vasculitis ([place]) 07/29/2013  . ANA positive 07/29/2013  . Other and unspecified hyperlipidemia 07/12/2013  . Contusion, chest wall 04/30/2013  . Malignant neoplasm of lower-outer quadrant of right breast of female, estrogen receptor positive ([place]) 01/03/2013  . Splenic lesion 09/02/2012  . Asthma, allergic 08/05/2012  . GERD (gastroesophageal reflux disease) 06/03/2012  . Former heavy tobacco smoker 06/03/2012  . Edema 04/08/2012  . Stricture and stenosis of esophagus 10/19/2011  . Constipation - functional 07/21/2011  . Nonspecific (abnormal) findings on radiological and other examination of biliary tract 07/21/2011  . Osteoporosis 04/17/2011  . POSTMENOPAUSAL STATUS 01/27/2011  . FIBROCYSTIC BREAST DISEASE, HX OF 10/18/2010  . Essential hypertension 08/25/2010  . CAD in native artery 08/25/2010  . TOBACCO ABUSE, HX OF 08/25/2010  . GENERALIZED ANXIETY DISORDER 08/03/2010  . FIBROMYALGIA 01/11/2010  . SKIN CANCER, HX  OF 01/11/2010      Laureen Abrahams, PT, DPT 09/12/18 3:30 PM    [place], [place], [address] Phone: [phone]   Fax:  [phone]  Name: [name] MRN: [id number] Date of Birth: 1946/12/23

## 2018-09-12 NOTE — Patient Instructions (Signed)
Continue with your Repatha injections.  If you decide to change your Medicare Part D drug plan, please let us know as soon as you get any new Medicare cards  Consider calling Dr. Vivien Rossetti at [facility]  [phone] for primary care.

## 2018-09-13 ENCOUNTER — Encounter: Payer: Self-pay | Admitting: Pharmacist Clinician (PhC)/ Clinical Pharmacy Specialist

## 2018-09-13 NOTE — Assessment & Plan Note (Signed)
Patient with hyperlipidemia, has done well with Repatha to date.  Gave two samples to patient today to help her decrease costs of donut hole.  She will let us know if she plans any changes to her Medicare Part D plan.  Will renew her PA at the end of the year so that she can continue into 2020.    Also gave patient names of PCP physicians in the [place] area, as she is currently looking for new provider.

## 2018-09-16 ENCOUNTER — Ambulatory Visit (INDEPENDENT_AMBULATORY_CARE_PROVIDER_SITE_OTHER): Payer: Medicare Other | Admitting: Physical Therapy

## 2018-09-16 DIAGNOSIS — M62838 Other muscle spasm: Secondary | ICD-10-CM

## 2018-09-16 DIAGNOSIS — M546 Pain in thoracic spine: Secondary | ICD-10-CM | POA: Diagnosis not present

## 2018-09-16 DIAGNOSIS — M6281 Muscle weakness (generalized): Secondary | ICD-10-CM | POA: Diagnosis not present

## 2018-09-16 NOTE — Therapy (Signed)
[place], [place], [address] Phone: [phone]   Fax:  [phone]  Physical Therapy Treatment  Patient Details  Name: [name] MRN: [id number] Date of Birth: 11/10/1946 Referring Provider: Dr. Melina Schools   Encounter Date: 09/16/2018  PT End of Session - 09/16/18 1521    Visit Number  6    Number of Visits  12    Date for PT Re-Evaluation  10/09/18    Authorization Type  [employer]    PT Start Time  1517    PT Stop Time  1605    PT Time Calculation (min)  48 min    Activity Tolerance  Patient tolerated treatment well    Behavior During Therapy  [facility] for tasks assessed/performed       Past Medical History:  Diagnosis Date  . Allergy   . Anemia   . Anxiety    past hx   . Arthritis   . Asthma   . Atrial flutter ([place])    past history- not current  . CAD (coronary artery disease) CARDIOLOGIST--  DR Angelena Form   mild non-obstructive cad  . Cancer ([place])    right  . Cataract    bilaterally removed   . Chronic constipation   . Chronic kidney disease    interstitial cystitis  . COPD (chronic obstructive pulmonary disease) ([place])   . Depression    past hx   . Family history of malignant hyperthermia    father had this  . Fibromyalgia   . Frequency of urination   . GERD (gastroesophageal reflux disease)   . H/O hiatal hernia   . History of basal cell carcinoma excision    X2  . History of breast cancer ONCOLOGIST-- DR Jana Hakim---  NO RECURRANCE   DX 07/2012;  LOW GRADE DCIS  ER+PR+  ----  S/P RIGHT LUMPECTOMY WITH NEGATIVE MARGINS/   RADIATION ENDED 11/2012  . History of chronic bronchitis   . History of colonic polyps   . History of tachycardia    CONTROLLED  WITH ATENOLOL  . Hyperlipidemia   . Hypertension   . Neuromuscular disorder (HCC)    fibromyalgia  . Osteoporosis   . Pelvic pain   . Personal history of radiation therapy   . S/P radiation therapy 11/12/12 - 12/05/12    right Breast  . Sepsis ([place]) 2014   from UTI   . Sinus headache   . Urgency of urination     Past Surgical History:  Procedure Laterality Date  . [place] STUDY N/A 04/03/2016   Procedure: [place] STUDY;  Surgeon: Manus Gunning, MD;  [facility];  Service: Gastroenterology;  Laterality: N/A;  . BREAST LUMPECTOMY Right 10-11-2012   W/ SLN BX  . CARDIAC CATHETERIZATION  09-13-2007  DR Lia Foyer   WELL-PRESERVED LVF/  DIFFUSE SCATTERED CORONARY CALCIFACATION AND ATHEROSCLEROSIS WITHOUT OBSTRUCTION  . CARDIAC CATHETERIZATION  08-04-2010  DR Angelena Form   NON-OBSTRUCTIVE CAD/  pLAD 40%/  oLAD 30%/  mLAD 30%/  pRCA 30%/  EF 60%  . CARDIOVASCULAR STRESS TEST  06-18-2012  DR McALHANY   LOW RISK NUCLEAR STUDY/  SMALL FIXED AREA OF MODERATELY DECREASED UPTAKE IN ANTEROSEPTAL WALL WHICH MAY BE ARTIFACTUAL/  NO ISCHEMIA/  EF 68%  . COLONOSCOPY  09-29-2010  . CYSTOSCOPY    . CYSTOSCOPY WITH HYDRODISTENSION AND BIOPSY N/A 03/06/2014   Procedure: CYSTOSCOPY/HYDRODISTENSION/ INSTILATION OF MARCAINE AND PYRIDIUM;  Surgeon: Ailene Rud, MD;  Location: [facility];  Service: Urology;  Laterality: N/A;  . ESOPHAGEAL MANOMETRY N/A 04/03/2016   Procedure: ESOPHAGEAL MANOMETRY (EM);  Surgeon: Manus Gunning, MD;  [facility];  Service: Gastroenterology;  Laterality: N/A;  . NASAL SINUS SURGERY  1985  . ORIF RIGHT ANKLE  FX  2006  . POLYPECTOMY    . REMOVAL VOCAL CORD CYST  FEB 2014  . RIGHT BREAST BX  08-23-2012  . RIGHT HAND SURGERY  X3  LAST ONE 2009   INCLUDES  ORIF RIGHT 5TH FINGER AND REVISION TWICE  . TONSILLECTOMY AND ADENOIDECTOMY  AGE 72  . TOTAL ABDOMINAL HYSTERECTOMY W/ BILATERAL SALPINGOOPHORECTOMY  1982   W/  APPENDECTOMY  . TRANSTHORACIC ECHOCARDIOGRAM  06-24-2012   GRADE I DIASTOLIC DYSFUNCTION/  EF 55-60%/  MILD MR  . UPPER GASTROINTESTINAL ENDOSCOPY      There were no vitals filed for this visit.  Subjective Assessment  - 09/16/18 1524    Subjective  Pt reports her back has had a flare up since riding to [place], [place] over weekend.   She does remark that since she has been doing therapy and exercises, she is feeling better.     Patient Stated Goals  be able to perform toileting without pain, sleep better at night    Currently in Pain?  Yes    Pain Score  7     Pain Location  Back    Pain Orientation  Lower         [facility] PT Assessment - 09/16/18 0001      Assessment   Medical Diagnosis  scoliosis    Referring Provider  Dr. Melina Schools      Strength   Right Hip Flexion  --   5-/5   Right Hip Extension  4-/5   with pain   Right Hip ABduction  4+/5    Left Hip Flexion  --   5-/5   Left Hip Extension  4+/5    Left Hip ABduction  4+/5       [facility] Adult PT Treatment/Exercise - 09/16/18 0001      Exercises   Exercises  Lumbar      Lumbar Exercises: Stretches   Passive Hamstring Stretch  Right;Left;2 reps;30 seconds   supine with strap   Single Knee to Chest Stretch  Right;Left;2 reps;30 seconds    Lower Trunk Rotation  3 reps;30 seconds    Piriformis Stretch  Right;Left;3 reps;30 seconds    Other Lumbar Stretch Exercise  midlevel doorway stretch x 20 sec x 2 reps      Lumbar Exercises: Aerobic   Nustep  L5 x 6.5 min (4 extremities; PTA present to discuss progress      Lumbar Exercises: Standing   Row  Both;10 reps;Theraband    Theraband Level (Row)  Level 2 (Red)    Shoulder Extension  Both;10 reps;Theraband    Theraband Level (Shoulder Extension)  Level 2 (Red)      Lumbar Exercises: Supine   Bridge  10 reps;5 seconds      Lumbar Exercises: Quadruped   [place]  10 reps      Modalities   Modalities  Moist Heat      Moist Heat Therapy   Number Minutes Moist Heat  10 Minutes    Moist Heat Location  Lumbar Spine   lower thoracic          PT Long Term Goals - 09/16/18 1528      PT  LONG TERM GOAL #1   Title  independent with HEP    Time  6    Period  Weeks     Status  On-going      PT LONG TERM GOAL #2   Title  FOTO score improved to </= 55% limited for improved function    Time  6    Period  Weeks    Status  On-going      PT LONG TERM GOAL #3   Title  perform thoracolumbar flexion without increase in pain    Time  6    Period  Weeks    Status  On-going      PT LONG TERM GOAL #4   Title  demonstrate at least 4/5 bil LE strength for improved function    Time  6    Period  Weeks    Status  Partially Met            Plan - 09/16/18 1536    Clinical Impression Statement  Pt reports 75% improvement in back pain, despite today's report of increased LBP after long car ride this weekend. She needed minor cues for proper form of exercise. Pt's LE strength has improved; has partially met LTG #4.  Pt will benefit from continued PT intervention to maximize functional mobility.     Rehab Potential  Good    PT Frequency  2x / week    PT Duration  6 weeks    PT Treatment/Interventions  ADLs/Self Care Home Management;Cryotherapy;Electrical Stimulation;Moist Heat;Traction;Ultrasound;Stair training;Functional mobility training;Therapeutic activities;Therapeutic exercise;Patient/family education;Neuromuscular re-education;Manual techniques;Dry needling    PT Next Visit Plan  continue hip/core strengthening.  Progress mobility, DN/manual/modalities PRN (trying to wean from modalities and focus on exercise)    PT Home Exercise Plan  Access Code: [email or web address]        Patient will benefit from skilled therapeutic intervention in order to improve the following deficits and impairments:  Difficulty walking, Decreased mobility, Increased muscle spasms, Improper body mechanics, Postural dysfunction, Impaired flexibility, Increased fascial restricitons, Decreased strength  Visit Diagnosis: Pain in thoracic spine  Muscle weakness (generalized)  Other muscle spasm     Problem List Patient Active Problem List   Diagnosis Date Noted  . Excessive  postexertional fatigue 04/02/2017  . Pneumonia due to virus 04/02/2017  . Chronic interstitial cystitis 02/01/2017  . Acute pharyngitis 01/02/2017  . Dysphagia   . Esophageal reflux   . Allergic rhinitis 01/25/2016  . Shortness of breath   . Left hip pain 04/30/2015  . Sciatica 04/30/2015  . Knee pain, right 04/30/2015  . Leukocytes in urine 04/30/2015  . Hyperlipidemia 03/01/2015  . Bilateral hand numbness 03/01/2015  . Pulmonary nodules 02/12/2015  . External hemorrhoids 02/05/2015  . Chronic night sweats 01/08/2015  . Atypical chest pain 01/01/2015  . Chest pain 01/01/2015  . Renal insufficiency 07/16/2014  . Sepsis secondary to UTI ([place]) 02/05/2014  . Grief reaction 12/29/2013  . Urinary hesitancy 12/04/2013  . Abdominal pain, other specified site 10/26/2013  . Headache(784.0) 10/13/2013  . Pain, joint, multiple sites 08/18/2013  . Vasculitis ([place]) 07/29/2013  . ANA positive 07/29/2013  . Other and unspecified hyperlipidemia 07/12/2013  . Contusion, chest wall 04/30/2013  . Malignant neoplasm of lower-outer quadrant of right breast of female, estrogen receptor positive ([place]) 01/03/2013  . Splenic lesion 09/02/2012  . Asthma, allergic 08/05/2012  . GERD (gastroesophageal reflux disease) 06/03/2012  . Former heavy tobacco smoker 06/03/2012  . Edema 04/08/2012  . Stricture and stenosis  of esophagus 10/19/2011  . Constipation - functional 07/21/2011  . Nonspecific (abnormal) findings on radiological and other examination of biliary tract 07/21/2011  . Osteoporosis 04/17/2011  . POSTMENOPAUSAL STATUS 01/27/2011  . FIBROCYSTIC BREAST DISEASE, HX OF 10/18/2010  . Essential hypertension 08/25/2010  . CAD in native artery 08/25/2010  . TOBACCO ABUSE, HX OF 08/25/2010  . GENERALIZED ANXIETY DISORDER 08/03/2010  . FIBROMYALGIA 01/11/2010  . SKIN CANCER, HX OF 01/11/2010   Kerin Perna, PTA 09/16/18 4:03 PM  [place] [address] [place], [place], [address] Phone: [phone]   Fax:  [phone]  Name: Bernadetta Roell MRN: [id number] Date of Birth: 01/08/1946

## 2018-09-17 DIAGNOSIS — M5030 Other cervical disc degeneration, unspecified cervical region: Secondary | ICD-10-CM | POA: Diagnosis not present

## 2018-09-17 DIAGNOSIS — M5136 Other intervertebral disc degeneration, lumbar region: Secondary | ICD-10-CM | POA: Diagnosis not present

## 2018-09-17 DIAGNOSIS — I10 Essential (primary) hypertension: Secondary | ICD-10-CM | POA: Diagnosis not present

## 2018-09-17 DIAGNOSIS — M797 Fibromyalgia: Secondary | ICD-10-CM | POA: Diagnosis not present

## 2018-09-17 HISTORY — DX: Other cervical disc degeneration, unspecified cervical region: M50.30

## 2018-09-18 ENCOUNTER — Encounter: Payer: Medicare Other | Admitting: Physical Therapy

## 2018-09-20 ENCOUNTER — Ambulatory Visit (INDEPENDENT_AMBULATORY_CARE_PROVIDER_SITE_OTHER): Payer: Medicare Other | Admitting: Physical Therapy

## 2018-09-20 ENCOUNTER — Encounter: Payer: Self-pay | Admitting: Physical Therapy

## 2018-09-20 DIAGNOSIS — M6281 Muscle weakness (generalized): Secondary | ICD-10-CM

## 2018-09-20 DIAGNOSIS — M62838 Other muscle spasm: Secondary | ICD-10-CM | POA: Diagnosis not present

## 2018-09-20 DIAGNOSIS — M546 Pain in thoracic spine: Secondary | ICD-10-CM

## 2018-09-20 NOTE — Therapy (Signed)
[place], [place], [address] Phone: [phone]   Fax:  [phone]  Physical Therapy Treatment  Patient Details  Name: [name] MRN: [id number] Date of Birth: 1946/12/13 Referring Provider (PT): Dr. Melina Schools   Encounter Date: 09/20/2018  PT End of Session - 09/20/18 1111    Visit Number  7    Number of Visits  12    Date for PT Re-Evaluation  10/09/18    Authorization Type  [employer]    PT Start Time  1025    PT Stop Time  1124    PT Time Calculation (min)  59 min    Activity Tolerance  Patient tolerated treatment well    Behavior During Therapy  [facility] for tasks assessed/performed       Past Medical History:  Diagnosis Date  . Allergy   . Anemia   . Anxiety    past hx   . Arthritis   . Asthma   . Atrial flutter ([place])    past history- not current  . CAD (coronary artery disease) CARDIOLOGIST--  DR Angelena Form   mild non-obstructive cad  . Cancer ([place])    right  . Cataract    bilaterally removed   . Chronic constipation   . Chronic kidney disease    interstitial cystitis  . COPD (chronic obstructive pulmonary disease) ([place])   . Depression    past hx   . Family history of malignant hyperthermia    father had this  . Fibromyalgia   . Frequency of urination   . GERD (gastroesophageal reflux disease)   . H/O hiatal hernia   . History of basal cell carcinoma excision    X2  . History of breast cancer ONCOLOGIST-- DR Jana Hakim---  NO RECURRANCE   DX 07/2012;  LOW GRADE DCIS  ER+PR+  ----  S/P RIGHT LUMPECTOMY WITH NEGATIVE MARGINS/   RADIATION ENDED 11/2012  . History of chronic bronchitis   . History of colonic polyps   . History of tachycardia    CONTROLLED  WITH ATENOLOL  . Hyperlipidemia   . Hypertension   . Neuromuscular disorder (HCC)    fibromyalgia  . Osteoporosis   . Pelvic pain   . Personal history of radiation therapy   . S/P radiation therapy 11/12/12 -  12/05/12   right Breast  . Sepsis ([place]) 2014   from UTI   . Sinus headache   . Urgency of urination     Past Surgical History:  Procedure Laterality Date  . [place] STUDY N/A 04/03/2016   Procedure: [place] STUDY;  Surgeon: Manus Gunning, MD;  Location: [facility];  Service: Gastroenterology;  Laterality: N/A;  . BREAST LUMPECTOMY Right 10-11-2012   W/ SLN BX  . CARDIAC CATHETERIZATION  09-13-2007  DR Lia Foyer   WELL-PRESERVED LVF/  DIFFUSE SCATTERED CORONARY CALCIFACATION AND ATHEROSCLEROSIS WITHOUT OBSTRUCTION  . CARDIAC CATHETERIZATION  08-04-2010  DR Angelena Form   NON-OBSTRUCTIVE CAD/  pLAD 40%/  oLAD 30%/  mLAD 30%/  pRCA 30%/  EF 60%  . CARDIOVASCULAR STRESS TEST  06-18-2012  DR McALHANY   LOW RISK NUCLEAR STUDY/  SMALL FIXED AREA OF MODERATELY DECREASED UPTAKE IN ANTEROSEPTAL WALL WHICH MAY BE ARTIFACTUAL/  NO ISCHEMIA/  EF 68%  . COLONOSCOPY  09-29-2010  . CYSTOSCOPY    . CYSTOSCOPY WITH HYDRODISTENSION AND BIOPSY N/A 03/06/2014   Procedure: CYSTOSCOPY/HYDRODISTENSION/ INSTILATION OF MARCAINE AND PYRIDIUM;  Surgeon: Ailene Rud,  MD;  Location: [facility];  Service: Urology;  Laterality: N/A;  . ESOPHAGEAL MANOMETRY N/A 04/03/2016   Procedure: ESOPHAGEAL MANOMETRY (EM);  Surgeon: Manus Gunning, MD;  [facility];  Service: Gastroenterology;  Laterality: N/A;  . NASAL SINUS SURGERY  1985  . ORIF RIGHT ANKLE  FX  2006  . POLYPECTOMY    . REMOVAL VOCAL CORD CYST  FEB 2014  . RIGHT BREAST BX  08-23-2012  . RIGHT HAND SURGERY  X3  LAST ONE 2009   INCLUDES  ORIF RIGHT 5TH FINGER AND REVISION TWICE  . TONSILLECTOMY AND ADENOIDECTOMY  AGE 72  . TOTAL ABDOMINAL HYSTERECTOMY W/ BILATERAL SALPINGOOPHORECTOMY  1982   W/  APPENDECTOMY  . TRANSTHORACIC ECHOCARDIOGRAM  06-24-2012   GRADE I DIASTOLIC DYSFUNCTION/  EF 55-60%/  MILD MR  . UPPER GASTROINTESTINAL ENDOSCOPY      There were no vitals filed for this visit.  Subjective  Assessment - 09/20/18 1023    Subjective  states it hasn't been a good week; lots of sitting over the weekend.  yesterday doing a lot of cleaning and bending over and experienced acute onset of sharp back pain.  better today but feels tight.  Went to Md this week: plan for repeat injection and continue PT 4-6 weeks with consideration for back surgery    Patient Stated Goals  be able to perform toileting without pain, sleep better at night    Currently in Pain?  Yes    Pain Score  7     Pain Location  Back    Pain Orientation  Lower    Pain Descriptors / Indicators  Aching;Dull    Pain Type  Neuropathic pain    Pain Onset  More than a month ago    Pain Frequency  Constant    Aggravating Factors   bending, walking    Pain Relieving Factors  repositioning, medication                       [facility] Adult PT Treatment/Exercise - 09/20/18 1030      Lumbar Exercises: Stretches   Double Knee to Chest Stretch  2 reps;30 seconds   with lower trunk rotation   Lower Trunk Rotation  3 reps;30 seconds    Other Lumbar Stretch Exercise  childs pose 3x30 sec      Lumbar Exercises: Aerobic   Nustep  L5 x 8 min      Lumbar Exercises: Supine   Bent Knee Raise  10 reps    Bridge  10 reps;5 seconds   with strap for isometric hip abduction     Lumbar Exercises: Quadruped   Madcat/Old Horse  10 reps      Modalities   Modalities  Electrical Stimulation;Moist Heat      Moist Heat Therapy   Number Minutes Moist Heat  15 Minutes    Moist Heat Location  Lumbar Spine   lower thoracic     Electrical Stimulation   Electrical Stimulation Location  low back    Electrical Stimulation Action  IFC    Electrical Stimulation Parameters  to tolerance x 15 min    Electrical Stimulation Goals  Pain      Manual Therapy   Manual Therapy  Soft tissue mobilization    Soft tissue mobilization  Rt glutes       Trigger Point Dry Needling - 09/20/18 1111    Consent Given?  Yes    Education Handout  Provided  Yes    Muscles Treated Lower Body  Gluteus minimus;Gluteus maximus    Gluteus Maximus Response  Twitch response elicited;Palpable increased muscle length    Gluteus Minimus Response  Twitch response elicited;Palpable increased muscle length                PT Long Term Goals - 09/16/18 1528      PT LONG TERM GOAL #1   Title  independent with HEP    Time  6    Period  Weeks    Status  On-going      PT LONG TERM GOAL #2   Title  FOTO score improved to </= 55% limited for improved function    Time  6    Period  Weeks    Status  On-going      PT LONG TERM GOAL #3   Title  perform thoracolumbar flexion without increase in pain    Time  6    Period  Weeks    Status  On-going      PT LONG TERM GOAL #4   Title  demonstrate at least 4/5 bil LE strength for improved function    Time  6    Period  Weeks    Status  Partially Met            Plan - 09/20/18 1112    Clinical Impression Statement  Pt overall reports improvements in pain but had set back this week when cleaning with improper mechanics.  Addressed trigger points in glutes today with DN and manual therapy.  Will continue to benefit from PT to maximize function.    Rehab Potential  Good    PT Frequency  2x / week    PT Duration  6 weeks    PT Treatment/Interventions  ADLs/Self Care Home Management;Cryotherapy;Electrical Stimulation;Moist Heat;Traction;Ultrasound;Stair training;Functional mobility training;Therapeutic activities;Therapeutic exercise;Patient/family education;Neuromuscular re-education;Manual techniques;Dry needling    PT Next Visit Plan  continue hip/core strengthening.  Progress mobility, DN/manual/modalities PRN (trying to wean from modalities and focus on exercise)    PT Home Exercise Plan  Access Code: [email or web address]        Patient will benefit from skilled therapeutic intervention in order to improve the following deficits and impairments:  Difficulty walking, Decreased mobility,  Increased muscle spasms, Improper body mechanics, Postural dysfunction, Impaired flexibility, Increased fascial restricitons, Decreased strength  Visit Diagnosis: Pain in thoracic spine  Muscle weakness (generalized)  Other muscle spasm     Problem List Patient Active Problem List   Diagnosis Date Noted  . Excessive postexertional fatigue 04/02/2017  . Pneumonia due to virus 04/02/2017  . Chronic interstitial cystitis 02/01/2017  . Acute pharyngitis 01/02/2017  . Dysphagia   . Esophageal reflux   . Allergic rhinitis 01/25/2016  . Shortness of breath   . Left hip pain 04/30/2015  . Sciatica 04/30/2015  . Knee pain, right 04/30/2015  . Leukocytes in urine 04/30/2015  . Hyperlipidemia 03/01/2015  . Bilateral hand numbness 03/01/2015  . Pulmonary nodules 02/12/2015  . External hemorrhoids 02/05/2015  . Chronic night sweats 01/08/2015  . Atypical chest pain 01/01/2015  . Chest pain 01/01/2015  . Renal insufficiency 07/16/2014  . Sepsis secondary to UTI ([place]) 02/05/2014  . Grief reaction 12/29/2013  . Urinary hesitancy 12/04/2013  . Abdominal pain, other specified site 10/26/2013  . Headache(784.0) 10/13/2013  . Pain, joint, multiple sites 08/18/2013  . Vasculitis ([place]) 07/29/2013  . ANA positive 07/29/2013  . Other and unspecified hyperlipidemia 07/12/2013  .  Contusion, chest wall 04/30/2013  . Malignant neoplasm of lower-outer quadrant of right breast of female, estrogen receptor positive ([place]) 01/03/2013  . Splenic lesion 09/02/2012  . Asthma, allergic 08/05/2012  . GERD (gastroesophageal reflux disease) 06/03/2012  . Former heavy tobacco smoker 06/03/2012  . Edema 04/08/2012  . Stricture and stenosis of esophagus 10/19/2011  . Constipation - functional 07/21/2011  . Nonspecific (abnormal) findings on radiological and other examination of biliary tract 07/21/2011  . Osteoporosis 04/17/2011  . POSTMENOPAUSAL STATUS 01/27/2011  . FIBROCYSTIC BREAST DISEASE, HX OF  10/18/2010  . Essential hypertension 08/25/2010  . CAD in native artery 08/25/2010  . TOBACCO ABUSE, HX OF 08/25/2010  . GENERALIZED ANXIETY DISORDER 08/03/2010  . FIBROMYALGIA 01/11/2010  . SKIN CANCER, HX OF 01/11/2010      Laureen Abrahams, PT, DPT 09/20/18 11:14 AM     [facility] [place], [place], [address] Phone: [phone]   Fax:  [phone]  Name: [name] MRN: [id number] Date of Birth: 06-21-1946

## 2018-09-23 DIAGNOSIS — M5136 Other intervertebral disc degeneration, lumbar region: Secondary | ICD-10-CM | POA: Diagnosis not present

## 2018-09-23 DIAGNOSIS — M5030 Other cervical disc degeneration, unspecified cervical region: Secondary | ICD-10-CM | POA: Diagnosis not present

## 2018-09-25 ENCOUNTER — Encounter: Payer: Self-pay | Admitting: Physical Therapy

## 2018-09-25 ENCOUNTER — Ambulatory Visit (INDEPENDENT_AMBULATORY_CARE_PROVIDER_SITE_OTHER): Payer: Medicare Other | Admitting: Physical Therapy

## 2018-09-25 DIAGNOSIS — M546 Pain in thoracic spine: Secondary | ICD-10-CM | POA: Diagnosis not present

## 2018-09-25 DIAGNOSIS — M62838 Other muscle spasm: Secondary | ICD-10-CM

## 2018-09-25 DIAGNOSIS — M6281 Muscle weakness (generalized): Secondary | ICD-10-CM | POA: Diagnosis not present

## 2018-09-25 NOTE — Therapy (Signed)
[place], [place], [address] Phone: [phone]   Fax:  [phone]  Physical Therapy Treatment  Patient Details  Name: [name] MRN: [id number] Date of Birth: [birth year] Referring Provider (PT): Dr. Melina Schools   Encounter Date: 09/25/2018  PT End of Session - 09/25/18 0856    Visit Number  8    Number of Visits  12    Date for PT Re-Evaluation  10/09/18    Authorization Type  [employer]    PT Start Time  0850    PT Stop Time  0945    PT Time Calculation (min)  55 min    Behavior During Therapy  [facility] for tasks assessed/performed       Past Medical History:  Diagnosis Date  . Allergy   . Anemia   . Anxiety    past hx   . Arthritis   . Asthma   . Atrial flutter ([place])    past history- not current  . CAD (coronary artery disease) CARDIOLOGIST--  DR Angelena Form   mild non-obstructive cad  . Cancer ([place])    right  . Cataract    bilaterally removed   . Chronic constipation   . Chronic kidney disease    interstitial cystitis  . COPD (chronic obstructive pulmonary disease) ([place])   . Depression    past hx   . Family history of malignant hyperthermia    father had this  . Fibromyalgia   . Frequency of urination   . GERD (gastroesophageal reflux disease)   . H/O hiatal hernia   . History of basal cell carcinoma excision    X2  . History of breast cancer ONCOLOGIST-- DR Jana Hakim---  NO RECURRANCE   DX 07/2012;  LOW GRADE DCIS  ER+PR+  ----  S/P RIGHT LUMPECTOMY WITH NEGATIVE MARGINS/   RADIATION ENDED 11/2012  . History of chronic bronchitis   . History of colonic polyps   . History of tachycardia    CONTROLLED  WITH ATENOLOL  . Hyperlipidemia   . Hypertension   . Neuromuscular disorder (HCC)    fibromyalgia  . Osteoporosis   . Pelvic pain   . Personal history of radiation therapy   . S/P radiation therapy 11/12/12 - 12/05/12   right Breast  . Sepsis ([place]) 2014   from UTI    . Sinus headache   . Urgency of urination     Past Surgical History:  Procedure Laterality Date  . [place] STUDY N/A 04/03/2016   Procedure: [place] STUDY;  Surgeon: Manus Gunning, MD;  [facility];  Service: Gastroenterology;  Laterality: N/A;  . BREAST LUMPECTOMY Right 10-11-2012   W/ SLN BX  . CARDIAC CATHETERIZATION  09-13-2007  DR Lia Foyer   WELL-PRESERVED LVF/  DIFFUSE SCATTERED CORONARY CALCIFACATION AND ATHEROSCLEROSIS WITHOUT OBSTRUCTION  . CARDIAC CATHETERIZATION  08-04-2010  DR Angelena Form   NON-OBSTRUCTIVE CAD/  pLAD 40%/  oLAD 30%/  mLAD 30%/  pRCA 30%/  EF 60%  . CARDIOVASCULAR STRESS TEST  06-18-2012  DR McALHANY   LOW RISK NUCLEAR STUDY/  SMALL FIXED AREA OF MODERATELY DECREASED UPTAKE IN ANTEROSEPTAL WALL WHICH MAY BE ARTIFACTUAL/  NO ISCHEMIA/  EF 68%  . COLONOSCOPY  09-29-2010  . CYSTOSCOPY    . CYSTOSCOPY WITH HYDRODISTENSION AND BIOPSY N/A 03/06/2014   Procedure: CYSTOSCOPY/HYDRODISTENSION/ INSTILATION OF MARCAINE AND PYRIDIUM;  Surgeon: Ailene Rud, MD;  Location: [facility];  Service: Urology;  Laterality: N/A;  . ESOPHAGEAL MANOMETRY N/A 04/03/2016   Procedure: ESOPHAGEAL MANOMETRY (EM);  Surgeon: Manus Gunning, MD;  [facility];  Service: Gastroenterology;  Laterality: N/A;  . NASAL SINUS SURGERY  1985  . ORIF RIGHT ANKLE  FX  2006  . POLYPECTOMY    . REMOVAL VOCAL CORD CYST  FEB 2014  . RIGHT BREAST BX  08-23-2012  . RIGHT HAND SURGERY  X3  LAST ONE 2009   INCLUDES  ORIF RIGHT 5TH FINGER AND REVISION TWICE  . TONSILLECTOMY AND ADENOIDECTOMY  AGE 32  . TOTAL ABDOMINAL HYSTERECTOMY W/ BILATERAL SALPINGOOPHORECTOMY  1982   W/  APPENDECTOMY  . TRANSTHORACIC ECHOCARDIOGRAM  06-24-2012   GRADE I DIASTOLIC DYSFUNCTION/  EF 55-60%/  MILD MR  . UPPER GASTROINTESTINAL ENDOSCOPY      There were no vitals filed for this visit.  Subjective Assessment - 09/25/18 0856    Subjective  Pt reports she was  bending over to water mums this morning and her back went into a spasm.  Glute squeezes seemed to help reduce back spasm.  She is unsure in DN helped any.      Currently in Pain?  Yes    Pain Score  7     Pain Location  Back    Pain Orientation  Lower    Pain Descriptors / Indicators  Aching;Dull    Aggravating Factors   bending over, walking    Pain Relieving Factors  medication, repositioning, some exercises.          [facility] PT Assessment - 09/25/18 0001      Assessment   Medical Diagnosis  scoliosis    Referring Provider (PT)  Dr. Melina Schools    Onset Date/Surgical Date  --   March 2019      [facility] Adult PT Treatment/Exercise - 09/25/18 0001      Exercises   Exercises  Lumbar      Lumbar Exercises: Stretches   Passive Hamstring Stretch  Right;Left;2 reps;30 seconds   supine      Lumbar Exercises: Aerobic   Nustep  L5 x 6.5 min (arms/ legs)      Lumbar Exercises: Supine   Ab Set  5 reps;5 seconds    Bent Knee Raise  10 reps   with ab set   Bridge  10 reps;5 seconds   with strap for isometric hip abduction     Lumbar Exercises: Sidelying   Clam  Right;Left;10 reps    Other Sidelying Lumbar Exercises  thoracic rotation with arms into horiz abdct x 5 each side.  Repeated after cat/cow      Lumbar Exercises: Quadruped   Madcat/Old Horse  5 reps   back spasmed; stopped     Moist Heat Therapy   Number Minutes Moist Heat  20 Minutes    Moist Heat Location  Lumbar Spine   lower thoracic     Electrical Stimulation   Electrical Stimulation Location  low back    Electrical Stimulation Action  IFC    Electrical Stimulation Parameters  to tolerance    Electrical Stimulation Goals  Pain                  PT Long Term Goals - 09/16/18 1528      PT LONG TERM GOAL #1   Title  independent with HEP    Time  6    Period  Weeks    Status  On-going  PT LONG TERM GOAL #2   Title  FOTO score improved to </= 55% limited for improved function    Time  6     Period  Weeks    Status  On-going      PT LONG TERM GOAL #3   Title  perform thoracolumbar flexion without increase in pain    Time  6    Period  Weeks    Status  On-going      PT LONG TERM GOAL #4   Title  demonstrate at least 4/5 bil LE strength for improved function    Time  6    Period  Weeks    Status  Partially Met            Plan - 09/25/18 1259    Clinical Impression Statement  Pt reported spasming in low back with performance of cat/cow; reduced with sidelying thoracic rotation and heat to low back at end of session. Noted tight Rt hamstring with supine stretch.  Pt tolerated all other exercises well.  Progressing towards goals, despite flare up of pain this morning.       Rehab Potential  Good    PT Frequency  2x / week    PT Duration  6 weeks    PT Treatment/Interventions  ADLs/Self Care Home Management;Cryotherapy;Electrical Stimulation;Moist Heat;Traction;Ultrasound;Stair training;Functional mobility training;Therapeutic activities;Therapeutic exercise;Patient/family education;Neuromuscular re-education;Manual techniques;Dry needling    PT Next Visit Plan  continue hip/core strengthening.  Progress mobility, DN/manual/modalities PRN (trying to wean from modalities and focus on exercise)    Consulted and Agree with Plan of Care  Patient       Patient will benefit from skilled therapeutic intervention in order to improve the following deficits and impairments:  Difficulty walking, Decreased mobility, Increased muscle spasms, Improper body mechanics, Postural dysfunction, Impaired flexibility, Increased fascial restricitons, Decreased strength  Visit Diagnosis: Pain in thoracic spine  Muscle weakness (generalized)  Other muscle spasm     Problem List Patient Active Problem List   Diagnosis Date Noted  . Excessive postexertional fatigue 04/02/2017  . Pneumonia due to virus 04/02/2017  . Chronic interstitial cystitis 02/01/2017  . Acute pharyngitis  01/02/2017  . Dysphagia   . Esophageal reflux   . Allergic rhinitis 01/25/2016  . Shortness of breath   . Left hip pain 04/30/2015  . Sciatica 04/30/2015  . Knee pain, right 04/30/2015  . Leukocytes in urine 04/30/2015  . Hyperlipidemia 03/01/2015  . Bilateral hand numbness 03/01/2015  . Pulmonary nodules 02/12/2015  . External hemorrhoids 02/05/2015  . Chronic night sweats 01/08/2015  . Atypical chest pain 01/01/2015  . Chest pain 01/01/2015  . Renal insufficiency 07/16/2014  . Sepsis secondary to UTI ([place]) 02/05/2014  . Grief reaction 12/29/2013  . Urinary hesitancy 12/04/2013  . Abdominal pain, other specified site 10/26/2013  . Headache(784.0) 10/13/2013  . Pain, joint, multiple sites 08/18/2013  . Vasculitis ([place]) 07/29/2013  . ANA positive 07/29/2013  . Other and unspecified hyperlipidemia 07/12/2013  . Contusion, chest wall 04/30/2013  . Malignant neoplasm of lower-outer quadrant of right breast of female, estrogen receptor positive ([place]) 01/03/2013  . Splenic lesion 09/02/2012  . Asthma, allergic 08/05/2012  . GERD (gastroesophageal reflux disease) 06/03/2012  . Former heavy tobacco smoker 06/03/2012  . Edema 04/08/2012  . Stricture and stenosis of esophagus 10/19/2011  . Constipation - functional 07/21/2011  . Nonspecific (abnormal) findings on radiological and other examination of biliary tract 07/21/2011  . Osteoporosis 04/17/2011  . POSTMENOPAUSAL STATUS 01/27/2011  .  FIBROCYSTIC BREAST DISEASE, HX OF 10/18/2010  . Essential hypertension 08/25/2010  . CAD in native artery 08/25/2010  . TOBACCO ABUSE, HX OF 08/25/2010  . GENERALIZED ANXIETY DISORDER 08/03/2010  . FIBROMYALGIA 01/11/2010  . SKIN CANCER, HX OF 01/11/2010   Kerin Perna, PTA 09/25/18 1:11 PM  [place], [place], [address] Phone: [phone]   Fax:  [phone]  Name: [name] MRN:  [id number] Date of Birth: 01/09/1946

## 2018-10-01 ENCOUNTER — Ambulatory Visit (INDEPENDENT_AMBULATORY_CARE_PROVIDER_SITE_OTHER): Payer: Medicare Other | Admitting: Physical Therapy

## 2018-10-01 ENCOUNTER — Encounter: Payer: Self-pay | Admitting: Physical Therapy

## 2018-10-01 DIAGNOSIS — M6281 Muscle weakness (generalized): Secondary | ICD-10-CM | POA: Diagnosis not present

## 2018-10-01 DIAGNOSIS — M546 Pain in thoracic spine: Secondary | ICD-10-CM

## 2018-10-01 DIAGNOSIS — M62838 Other muscle spasm: Secondary | ICD-10-CM | POA: Diagnosis not present

## 2018-10-01 NOTE — Therapy (Signed)
[place], [place], [address] Phone: [phone]   Fax:  [phone]  Physical Therapy Treatment  Patient Details  Name: [name] MRN: [id number] Date of Birth: 1946-02-21 Referring Provider (PT): Dr. Melina Schools   Encounter Date: 10/01/2018  PT End of Session - 10/01/18 1006    Visit Number  9    Number of Visits  12    Date for PT Re-Evaluation  10/09/18    Authorization Type  [employer]    PT Start Time  0930    PT Stop Time  1008    PT Time Calculation (min)  38 min    Activity Tolerance  Patient tolerated treatment well    Behavior During Therapy  [facility] for tasks assessed/performed       Past Medical History:  Diagnosis Date  . Allergy   . Anemia   . Anxiety    past hx   . Arthritis   . Asthma   . Atrial flutter ([place])    past history- not current  . CAD (coronary artery disease) CARDIOLOGIST--  DR Angelena Form   mild non-obstructive cad  . Cancer ([place])    right  . Cataract    bilaterally removed   . Chronic constipation   . Chronic kidney disease    interstitial cystitis  . COPD (chronic obstructive pulmonary disease) ([place])   . Depression    past hx   . Family history of malignant hyperthermia    father had this  . Fibromyalgia   . Frequency of urination   . GERD (gastroesophageal reflux disease)   . H/O hiatal hernia   . History of basal cell carcinoma excision    X2  . History of breast cancer ONCOLOGIST-- DR Jana Hakim---  NO RECURRANCE   DX 07/2012;  LOW GRADE DCIS  ER+PR+  ----  S/P RIGHT LUMPECTOMY WITH NEGATIVE MARGINS/   RADIATION ENDED 11/2012  . History of chronic bronchitis   . History of colonic polyps   . History of tachycardia    CONTROLLED  WITH ATENOLOL  . Hyperlipidemia   . Hypertension   . Neuromuscular disorder (HCC)    fibromyalgia  . Osteoporosis   . Pelvic pain   . Personal history of radiation therapy   . S/P radiation therapy 11/12/12 -  12/05/12   right Breast  . Sepsis ([place]) 2014   from UTI   . Sinus headache   . Urgency of urination     Past Surgical History:  Procedure Laterality Date  . [place] STUDY N/A 04/03/2016   Procedure: [place] STUDY;  Surgeon: Manus Gunning, MD;  Location: [facility];  Service: Gastroenterology;  Laterality: N/A;  . BREAST LUMPECTOMY Right 10-11-2012   W/ SLN BX  . CARDIAC CATHETERIZATION  09-13-2007  DR Lia Foyer   WELL-PRESERVED LVF/  DIFFUSE SCATTERED CORONARY CALCIFACATION AND ATHEROSCLEROSIS WITHOUT OBSTRUCTION  . CARDIAC CATHETERIZATION  08-04-2010  DR Angelena Form   NON-OBSTRUCTIVE CAD/  pLAD 40%/  oLAD 30%/  mLAD 30%/  pRCA 30%/  EF 60%  . CARDIOVASCULAR STRESS TEST  06-18-2012  DR McALHANY   LOW RISK NUCLEAR STUDY/  SMALL FIXED AREA OF MODERATELY DECREASED UPTAKE IN ANTEROSEPTAL WALL WHICH MAY BE ARTIFACTUAL/  NO ISCHEMIA/  EF 68%  . COLONOSCOPY  09-29-2010  . CYSTOSCOPY    . CYSTOSCOPY WITH HYDRODISTENSION AND BIOPSY N/A 03/06/2014   Procedure: CYSTOSCOPY/HYDRODISTENSION/ INSTILATION OF MARCAINE AND PYRIDIUM;  Surgeon: Ailene Rud,  MD;  Location: [facility];  Service: Urology;  Laterality: N/A;  . ESOPHAGEAL MANOMETRY N/A 04/03/2016   Procedure: ESOPHAGEAL MANOMETRY (EM);  Surgeon: Manus Gunning, MD;  [facility];  Service: Gastroenterology;  Laterality: N/A;  . NASAL SINUS SURGERY  1985  . ORIF RIGHT ANKLE  FX  2006  . POLYPECTOMY    . REMOVAL VOCAL CORD CYST  FEB 2014  . RIGHT BREAST BX  08-23-2012  . RIGHT HAND SURGERY  X3  LAST ONE 2009   INCLUDES  ORIF RIGHT 5TH FINGER AND REVISION TWICE  . TONSILLECTOMY AND ADENOIDECTOMY  AGE 72  . TOTAL ABDOMINAL HYSTERECTOMY W/ BILATERAL SALPINGOOPHORECTOMY  1982   W/  APPENDECTOMY  . TRANSTHORACIC ECHOCARDIOGRAM  06-24-2012   GRADE I DIASTOLIC DYSFUNCTION/  EF 55-60%/  MILD MR  . UPPER GASTROINTESTINAL ENDOSCOPY      There were no vitals filed for this visit.  Subjective  Assessment - 10/01/18 0934    Subjective  going to [facility] this weekend and will have to go up/down a lot of stairs at family's home    Patient Stated Goals  be able to perform toileting without pain, sleep better at night    Currently in Pain?  Yes    Pain Score  5     Pain Location  Back    Pain Orientation  Lower    Pain Descriptors / Indicators  Aching;Dull    Pain Type  Neuropathic pain    Pain Onset  More than a month ago    Pain Frequency  Constant    Aggravating Factors   bending over, walking    Pain Relieving Factors  medications, repositioning, some exercises                       [facility] Adult PT Treatment/Exercise - 10/01/18 0935      Self-Care   Self-Care  Other Self-Care Comments    Other Self-Care Comments   positioning in car for upcoming trip      Therapeutic Activites    Therapeutic Activities  Other Therapeutic Activities    Other Therapeutic Activities  negotiated stairs with cues and education on safe technique and core activation; provided recommendations for safe stair negotiation      Lumbar Exercises: Aerobic   Nustep  L5 x 8 min   PT present to discuss progress     Lumbar Exercises: Supine   Bridge  10 reps;5 seconds      Manual Therapy   Manual Therapy  Muscle Energy Technique    Muscle Energy Technique  Rt hip LAD followed by isometric hip extension on Rt             PT Education - 10/01/18 1006    Education Details  see self care    Person(s) Educated  Patient    Methods  Explanation;Demonstration    Comprehension  Verbalized understanding          PT Long Term Goals - 09/16/18 1528      PT LONG TERM GOAL #1   Title  independent with HEP    Time  6    Period  Weeks    Status  On-going      PT LONG TERM GOAL #2   Title  FOTO score improved to </= 55% limited for improved function    Time  6    Period  Weeks    Status  On-going  PT LONG TERM GOAL #3   Title  perform thoracolumbar flexion without  increase in pain    Time  6    Period  Weeks    Status  On-going      PT LONG TERM GOAL #4   Title  demonstrate at least 4/5 bil LE strength for improved function    Time  6    Period  Weeks    Status  Partially Met            Plan - 10/01/18 1006    Clinical Impression Statement  Pt tolerated session well today reporting no pain following MET today.  Pt going out of town tomorrow, and will return next week.  Will determine if pt needs to continue PT v/s D/C after returning from trip.    Rehab Potential  Good    PT Frequency  2x / week    PT Duration  6 weeks    PT Treatment/Interventions  ADLs/Self Care Home Management;Cryotherapy;Electrical Stimulation;Moist Heat;Traction;Ultrasound;Stair training;Functional mobility training;Therapeutic activities;Therapeutic exercise;Patient/family education;Neuromuscular re-education;Manual techniques;Dry needling    PT Next Visit Plan  continue hip/core strengthening.  Progress mobility, DN/manual/modalities PRN (trying to wean from modalities and focus on exercise); check goals/d/c v/s continue    PT Home Exercise Plan  Access Code: [email or web address]     Consulted and Agree with Plan of Care  Patient       Patient will benefit from skilled therapeutic intervention in order to improve the following deficits and impairments:  Difficulty walking, Decreased mobility, Increased muscle spasms, Improper body mechanics, Postural dysfunction, Impaired flexibility, Increased fascial restricitons, Decreased strength  Visit Diagnosis: Pain in thoracic spine  Muscle weakness (generalized)  Other muscle spasm     Problem List Patient Active Problem List   Diagnosis Date Noted  . Excessive postexertional fatigue 04/02/2017  . Pneumonia due to virus 04/02/2017  . Chronic interstitial cystitis 02/01/2017  . Acute pharyngitis 01/02/2017  . Dysphagia   . Esophageal reflux   . Allergic rhinitis 01/25/2016  . Shortness of breath   . Left hip pain  04/30/2015  . Sciatica 04/30/2015  . Knee pain, right 04/30/2015  . Leukocytes in urine 04/30/2015  . Hyperlipidemia 03/01/2015  . Bilateral hand numbness 03/01/2015  . Pulmonary nodules 02/12/2015  . External hemorrhoids 02/05/2015  . Chronic night sweats 01/08/2015  . Atypical chest pain 01/01/2015  . Chest pain 01/01/2015  . Renal insufficiency 07/16/2014  . Sepsis secondary to UTI ([place]) 02/05/2014  . Grief reaction 12/29/2013  . Urinary hesitancy 12/04/2013  . Abdominal pain, other specified site 10/26/2013  . Headache(784.0) 10/13/2013  . Pain, joint, multiple sites 08/18/2013  . Vasculitis ([place]) 07/29/2013  . ANA positive 07/29/2013  . Other and unspecified hyperlipidemia 07/12/2013  . Contusion, chest wall 04/30/2013  . Malignant neoplasm of lower-outer quadrant of right breast of female, estrogen receptor positive ([place]) 01/03/2013  . Splenic lesion 09/02/2012  . Asthma, allergic 08/05/2012  . GERD (gastroesophageal reflux disease) 06/03/2012  . Former heavy tobacco smoker 06/03/2012  . Edema 04/08/2012  . Stricture and stenosis of esophagus 10/19/2011  . Constipation - functional 07/21/2011  . Nonspecific (abnormal) findings on radiological and other examination of biliary tract 07/21/2011  . Osteoporosis 04/17/2011  . POSTMENOPAUSAL STATUS 01/27/2011  . FIBROCYSTIC BREAST DISEASE, HX OF 10/18/2010  . Essential hypertension 08/25/2010  . CAD in native artery 08/25/2010  . TOBACCO ABUSE, HX OF 08/25/2010  . GENERALIZED ANXIETY DISORDER 08/03/2010  . FIBROMYALGIA 01/11/2010  . SKIN CANCER,  HX OF 01/11/2010      Laureen Abrahams, PT, DPT 10/01/18 10:12 AM    [facility] [place], [place], [address] Phone: [phone]   Fax:  [phone]  Name: [name] MRN: [id number] Date of Birth: 06/17/1946

## 2018-10-09 ENCOUNTER — Ambulatory Visit: Payer: Medicare Other | Admitting: Physical Therapy

## 2018-10-09 ENCOUNTER — Encounter: Payer: Self-pay | Admitting: Physical Therapy

## 2018-10-09 DIAGNOSIS — M6281 Muscle weakness (generalized): Secondary | ICD-10-CM | POA: Diagnosis not present

## 2018-10-09 DIAGNOSIS — M62838 Other muscle spasm: Secondary | ICD-10-CM | POA: Diagnosis not present

## 2018-10-09 DIAGNOSIS — M546 Pain in thoracic spine: Secondary | ICD-10-CM | POA: Diagnosis not present

## 2018-10-09 NOTE — Therapy (Signed)
[place], [place], [address] Phone: [phone]   Fax:  [phone]  Physical Therapy Treatment/Recertification/Progress Note  Patient Details  Name: [name] MRN: [id number] Date of Birth: [birth year] Referring Provider (PT): Dr. Melina Schools    Progress Note Reporting Period 08/28/18 to 10/09/18  See note below for Objective Data and Assessment of Progress/Goals.       Encounter Date: 10/09/2018  PT End of Session - 10/09/18 1013    Visit Number  10    Number of Visits  15    Date for PT Re-Evaluation  11/06/18    Authorization Type  [employer]    PT Start Time  0930    PT Stop Time  1010    PT Time Calculation (min)  40 min    Activity Tolerance  Patient tolerated treatment well    Behavior During Therapy  WFL for tasks assessed/performed       Past Medical History:  Diagnosis Date  . Allergy   . Anemia   . Anxiety    past hx   . Arthritis   . Asthma   . Atrial flutter ([place])    past history- not current  . CAD (coronary artery disease) CARDIOLOGIST--  DR Angelena Form   mild non-obstructive cad  . Cancer ([place])    right  . Cataract    bilaterally removed   . Chronic constipation   . Chronic kidney disease    interstitial cystitis  . COPD (chronic obstructive pulmonary disease) ([place])   . Depression    past hx   . Family history of malignant hyperthermia    father had this  . Fibromyalgia   . Frequency of urination   . GERD (gastroesophageal reflux disease)   . H/O hiatal hernia   . History of basal cell carcinoma excision    X2  . History of breast cancer ONCOLOGIST-- DR Jana Hakim---  NO RECURRANCE   DX 07/2012;  LOW GRADE DCIS  ER+PR+  ----  S/P RIGHT LUMPECTOMY WITH NEGATIVE MARGINS/   RADIATION ENDED 11/2012  . History of chronic bronchitis   . History of colonic polyps   . History of tachycardia    CONTROLLED  WITH ATENOLOL  . Hyperlipidemia   . Hypertension    . Neuromuscular disorder (HCC)    fibromyalgia  . Osteoporosis   . Pelvic pain   . Personal history of radiation therapy   . S/P radiation therapy 11/12/12 - 12/05/12   right Breast  . Sepsis ([place]) 2014   from UTI   . Sinus headache   . Urgency of urination     Past Surgical History:  Procedure Laterality Date  . [place] STUDY N/A 04/03/2016   Procedure: [place] STUDY;  Surgeon: Manus Gunning, MD;  [facility];  Service: Gastroenterology;  Laterality: N/A;  . BREAST LUMPECTOMY Right 10-11-2012   W/ SLN BX  . CARDIAC CATHETERIZATION  09-13-2007  DR Lia Foyer   WELL-PRESERVED LVF/  DIFFUSE SCATTERED CORONARY CALCIFACATION AND ATHEROSCLEROSIS WITHOUT OBSTRUCTION  . CARDIAC CATHETERIZATION  08-04-2010  DR Angelena Form   NON-OBSTRUCTIVE CAD/  pLAD 40%/  oLAD 30%/  mLAD 30%/  pRCA 30%/  EF 60%  . CARDIOVASCULAR STRESS TEST  06-18-2012  DR McALHANY   LOW RISK NUCLEAR STUDY/  SMALL FIXED AREA OF MODERATELY DECREASED UPTAKE IN ANTEROSEPTAL WALL WHICH MAY BE ARTIFACTUAL/  NO ISCHEMIA/  EF 68%  . COLONOSCOPY  09-29-2010  .  CYSTOSCOPY    . CYSTOSCOPY WITH HYDRODISTENSION AND BIOPSY N/A 03/06/2014   Procedure: CYSTOSCOPY/HYDRODISTENSION/ INSTILATION OF MARCAINE AND PYRIDIUM;  Surgeon: Ailene Rud, MD;  Location: [facility];  Service: Urology;  Laterality: N/A;  . ESOPHAGEAL MANOMETRY N/A 04/03/2016   Procedure: ESOPHAGEAL MANOMETRY (EM);  Surgeon: Manus Gunning, MD;  [facility];  Service: Gastroenterology;  Laterality: N/A;  . NASAL SINUS SURGERY  1985  . ORIF RIGHT ANKLE  FX  2006  . POLYPECTOMY    . REMOVAL VOCAL CORD CYST  FEB 2014  . RIGHT BREAST BX  08-23-2012  . RIGHT HAND SURGERY  X3  LAST ONE 2009   INCLUDES  ORIF RIGHT 5TH FINGER AND REVISION TWICE  . TONSILLECTOMY AND ADENOIDECTOMY  AGE 25  . TOTAL ABDOMINAL HYSTERECTOMY W/ BILATERAL SALPINGOOPHORECTOMY  1982   W/  APPENDECTOMY  . TRANSTHORACIC ECHOCARDIOGRAM   06-24-2012   GRADE I DIASTOLIC DYSFUNCTION/  EF 55-60%/  MILD MR  . UPPER GASTROINTESTINAL ENDOSCOPY      There were no vitals filed for this visit.  Subjective Assessment - 10/09/18 0938    Subjective  driving to[place]from [facility] was fine, had a lot of difficulty with the stairs.     Patient Stated Goals  be able to perform toileting without pain, sleep better at night    Currently in Pain?  Yes    Pain Score  5     Pain Location  Back    Pain Orientation  Lower    Pain Descriptors / Indicators  Aching;Dull    Pain Type  Neuropathic pain    Pain Onset  More than a month ago    Pain Frequency  Constant    Aggravating Factors   bending over, walking    Pain Relieving Factors  medications, repositioning, some exercises         [facility] PT Assessment - 10/09/18 0950      Observation/Other Assessments   Focus on Therapeutic Outcomes (FOTO)   47 (53% limited)      AROM   Overall AROM Comments  mild increase in pain with thoracolumbar flexion                   [facility] Adult PT Treatment/Exercise - 10/09/18 0939      Lumbar Exercises: Stretches   Single Knee to Chest Stretch  Right;Left;2 reps;30 seconds    Lower Trunk Rotation  2 reps;30 seconds   bil   Piriformis Stretch  Right;Left;2 reps;30 seconds      Lumbar Exercises: Aerobic   Nustep  L5 x 8 min   PT present to discuss progress     Lumbar Exercises: Supine   Ab Set  10 reps;5 seconds    Clam  10 reps    Clam Limitations  with ab set, each leg             PT Education - 10/09/18 1013    Education Details  current progress and POC    Person(s) Educated  Patient    Methods  Explanation    Comprehension  Verbalized understanding          PT Long Term Goals - 10/09/18 1013      PT LONG TERM GOAL #1   Title  independent with HEP    Baseline  10/16: met as of today; plan to progress    Time  6    Period  Weeks    Status  On-going  PT LONG TERM GOAL #2   Title  FOTO score improved to </=  55% limited for improved function    Time  6    Period  Weeks    Status  Achieved      PT LONG TERM GOAL #3   Title  perform thoracolumbar flexion without increase in pain    Baseline  10/16: improved; mild increase in pain but report no sharp pain as described on eval    Time  6    Period  Weeks    Status  Partially Met      PT LONG TERM GOAL #4   Title  demonstrate at least 4/5 bil LE strength for improved function    Time  6    Period  Weeks    Status  On-going    Target Date  11/06/18      PT LONG TERM GOAL #5   Title  verbalize compliance with community fitness and plans to continue at d/c    Status  New    Target Date  11/06/18            Plan - 10/09/18 1015    Clinical Impression Statement  Pt demonstrating improved pain and symptoms overall, but exacerbation following road trip to [place] last weekend.  Pt is progressing well meeting most goals at this time.  Plan to decrease frequency to 1x/wk starting next week to update HEP and treat pain PRN.  Overall pt doing well and anticipate d/c in 3-4 more visits.    Rehab Potential  Good    PT Frequency  2x / week    PT Duration  6 weeks    PT Treatment/Interventions  ADLs/Self Care Home Management;Cryotherapy;Electrical Stimulation;Moist Heat;Traction;Ultrasound;Stair training;Functional mobility training;Therapeutic activities;Therapeutic exercise;Patient/family education;Neuromuscular re-education;Manual techniques;Dry needling    PT Next Visit Plan  continue hip/core strengthening.  Progress mobility, DN/manual/modalities PRN (trying to wean from modalities and focus on exercise); MET PRN    PT Home Exercise Plan  Access Code: [email or web address]     Consulted and Agree with Plan of Care  Patient       Patient will benefit from skilled therapeutic intervention in order to improve the following deficits and impairments:  Difficulty walking, Decreased mobility, Increased muscle spasms, Improper body mechanics, Postural  dysfunction, Impaired flexibility, Increased fascial restricitons, Decreased strength  Visit Diagnosis: Pain in thoracic spine - Plan: PT plan of care cert/re-cert  Muscle weakness (generalized) - Plan: PT plan of care cert/re-cert  Other muscle spasm - Plan: PT plan of care cert/re-cert     Problem List Patient Active Problem List   Diagnosis Date Noted  . Excessive postexertional fatigue 04/02/2017  . Pneumonia due to virus 04/02/2017  . Chronic interstitial cystitis 02/01/2017  . Acute pharyngitis 01/02/2017  . Dysphagia   . Esophageal reflux   . Allergic rhinitis 01/25/2016  . Shortness of breath   . Left hip pain 04/30/2015  . Sciatica 04/30/2015  . Knee pain, right 04/30/2015  . Leukocytes in urine 04/30/2015  . Hyperlipidemia 03/01/2015  . Bilateral hand numbness 03/01/2015  . Pulmonary nodules 02/12/2015  . External hemorrhoids 02/05/2015  . Chronic night sweats 01/08/2015  . Atypical chest pain 01/01/2015  . Chest pain 01/01/2015  . Renal insufficiency 07/16/2014  . Sepsis secondary to UTI ([place]) 02/05/2014  . Grief reaction 12/29/2013  . Urinary hesitancy 12/04/2013  . Abdominal pain, other specified site 10/26/2013  . Headache(784.0) 10/13/2013  . Pain, joint, multiple sites 08/18/2013  .  Vasculitis ([place]) 07/29/2013  . ANA positive 07/29/2013  . Other and unspecified hyperlipidemia 07/12/2013  . Contusion, chest wall 04/30/2013  . Malignant neoplasm of lower-outer quadrant of right breast of female, estrogen receptor positive ([place]) 01/03/2013  . Splenic lesion 09/02/2012  . Asthma, allergic 08/05/2012  . GERD (gastroesophageal reflux disease) 06/03/2012  . Former heavy tobacco smoker 06/03/2012  . Edema 04/08/2012  . Stricture and stenosis of esophagus 10/19/2011  . Constipation - functional 07/21/2011  . Nonspecific (abnormal) findings on radiological and other examination of biliary tract 07/21/2011  . Osteoporosis 04/17/2011  . POSTMENOPAUSAL STATUS  01/27/2011  . FIBROCYSTIC BREAST DISEASE, HX OF 10/18/2010  . Essential hypertension 08/25/2010  . CAD in native artery 08/25/2010  . TOBACCO ABUSE, HX OF 08/25/2010  . GENERALIZED ANXIETY DISORDER 08/03/2010  . FIBROMYALGIA 01/11/2010  . SKIN CANCER, HX OF 01/11/2010      Laureen Abrahams, PT, DPT 10/09/18 10:19 AM    [facility] [place], [place], [address] Phone: [phone]   Fax:  [phone]  Name: [name] MRN: [id number] Date of Birth: [birth year]

## 2018-10-10 DIAGNOSIS — M5136 Other intervertebral disc degeneration, lumbar region: Secondary | ICD-10-CM | POA: Diagnosis not present

## 2018-10-11 ENCOUNTER — Encounter: Payer: Medicare Other | Admitting: Physical Therapy

## 2018-10-16 ENCOUNTER — Encounter: Payer: Medicare Other | Admitting: Physical Therapy

## 2018-10-21 DIAGNOSIS — L218 Other seborrheic dermatitis: Secondary | ICD-10-CM | POA: Diagnosis not present

## 2018-10-21 DIAGNOSIS — L814 Other melanin hyperpigmentation: Secondary | ICD-10-CM | POA: Diagnosis not present

## 2018-10-21 DIAGNOSIS — L821 Other seborrheic keratosis: Secondary | ICD-10-CM | POA: Diagnosis not present

## 2018-10-21 DIAGNOSIS — L853 Xerosis cutis: Secondary | ICD-10-CM | POA: Diagnosis not present

## 2018-10-22 ENCOUNTER — Encounter: Payer: Self-pay | Admitting: Physical Therapy

## 2018-10-22 ENCOUNTER — Ambulatory Visit: Payer: Medicare Other | Admitting: Physical Therapy

## 2018-10-22 DIAGNOSIS — M62838 Other muscle spasm: Secondary | ICD-10-CM

## 2018-10-22 DIAGNOSIS — M6281 Muscle weakness (generalized): Secondary | ICD-10-CM | POA: Diagnosis not present

## 2018-10-22 DIAGNOSIS — M546 Pain in thoracic spine: Secondary | ICD-10-CM

## 2018-10-22 NOTE — Therapy (Signed)
[place], [place], [address] Phone: [phone]   Fax:  [phone]  Physical Therapy Treatment  Patient Details  Name: [name] MRN: [id number] Date of Birth: 10/05/1946 Referring Provider (PT): Dr. Melina Schools   Encounter Date: 10/22/2018  PT End of Session - 10/22/18 1011    Visit Number  11    Number of Visits  15    Date for PT Re-Evaluation  11/06/18    Authorization Type  [employer]    PT Start Time  0930    PT Stop Time  1009    PT Time Calculation (min)  39 min    Activity Tolerance  Patient tolerated treatment well    Behavior During Therapy  [facility] for tasks assessed/performed       Past Medical History:  Diagnosis Date  . Allergy   . Anemia   . Anxiety    past hx   . Arthritis   . Asthma   . Atrial flutter ([place])    past history- not current  . CAD (coronary artery disease) CARDIOLOGIST--  DR Angelena Form   mild non-obstructive cad  . Cancer ([place])    right  . Cataract    bilaterally removed   . Chronic constipation   . Chronic kidney disease    interstitial cystitis  . COPD (chronic obstructive pulmonary disease) ([place])   . Depression    past hx   . Family history of malignant hyperthermia    father had this  . Fibromyalgia   . Frequency of urination   . GERD (gastroesophageal reflux disease)   . H/O hiatal hernia   . History of basal cell carcinoma excision    X2  . History of breast cancer ONCOLOGIST-- DR Jana Hakim---  NO RECURRANCE   DX 07/2012;  LOW GRADE DCIS  ER+PR+  ----  S/P RIGHT LUMPECTOMY WITH NEGATIVE MARGINS/   RADIATION ENDED 11/2012  . History of chronic bronchitis   . History of colonic polyps   . History of tachycardia    CONTROLLED  WITH ATENOLOL  . Hyperlipidemia   . Hypertension   . Neuromuscular disorder (HCC)    fibromyalgia  . Osteoporosis   . Pelvic pain   . Personal history of radiation therapy   . S/P radiation therapy 11/12/12 -  12/05/12   right Breast  . Sepsis ([place]) 2014   from UTI   . Sinus headache   . Urgency of urination     Past Surgical History:  Procedure Laterality Date  . [place] STUDY N/A 04/03/2016   Procedure: [place] STUDY;  Surgeon: Manus Gunning, MD;  [facility]: [facility];  Service: Gastroenterology;  Laterality: N/A;  . BREAST LUMPECTOMY Right 10-11-2012   W/ SLN BX  . CARDIAC CATHETERIZATION  09-13-2007  DR Lia Foyer   WELL-PRESERVED LVF/  DIFFUSE SCATTERED CORONARY CALCIFACATION AND ATHEROSCLEROSIS WITHOUT OBSTRUCTION  . CARDIAC CATHETERIZATION  08-04-2010  DR Angelena Form   NON-OBSTRUCTIVE CAD/  pLAD 40%/  oLAD 30%/  mLAD 30%/  pRCA 30%/  EF 60%  . CARDIOVASCULAR STRESS TEST  06-18-2012  DR McALHANY   LOW RISK NUCLEAR STUDY/  SMALL FIXED AREA OF MODERATELY DECREASED UPTAKE IN ANTEROSEPTAL WALL WHICH MAY BE ARTIFACTUAL/  NO ISCHEMIA/  EF 68%  . COLONOSCOPY  09-29-2010  . CYSTOSCOPY    . CYSTOSCOPY WITH HYDRODISTENSION AND BIOPSY N/A 03/06/2014   Procedure: CYSTOSCOPY/HYDRODISTENSION/ INSTILATION OF MARCAINE AND PYRIDIUM;  Surgeon: Ailene Rud,  MD;  Location: [facility] [place];  Service: Urology;  Laterality: N/A;  . ESOPHAGEAL MANOMETRY N/A 04/03/2016   Procedure: ESOPHAGEAL MANOMETRY (EM);  Surgeon: Manus Gunning, MD;  [facility];  Service: Gastroenterology;  Laterality: N/A;  . NASAL SINUS SURGERY  1985  . ORIF RIGHT ANKLE  FX  2006  . POLYPECTOMY    . REMOVAL VOCAL CORD CYST  FEB 2014  . RIGHT BREAST BX  08-23-2012  . RIGHT HAND SURGERY  X3  LAST ONE 2009   INCLUDES  ORIF RIGHT 5TH FINGER AND REVISION TWICE  . TONSILLECTOMY AND ADENOIDECTOMY  AGE 72  . TOTAL ABDOMINAL HYSTERECTOMY W/ BILATERAL SALPINGOOPHORECTOMY  1982   W/  APPENDECTOMY  . TRANSTHORACIC ECHOCARDIOGRAM  06-24-2012   GRADE I DIASTOLIC DYSFUNCTION/  EF 55-60%/  MILD MR  . UPPER GASTROINTESTINAL ENDOSCOPY      There were no vitals filed for this visit.  Subjective  Assessment - 10/22/18 0933    Subjective  "I do something stupid and then I have pain but I have a hard time getting out of it."  thinks sitting and driving are the most aggravating.    Patient Stated Goals  be able to perform toileting without pain, sleep better at night    Currently in Pain?  Yes    Pain Score  --   would not rate   Pain Location  Back    Pain Orientation  Mid;Upper    Pain Descriptors / Indicators  Stabbing    Pain Onset  More than a month ago    Pain Frequency  Intermittent    Aggravating Factors   twisting, sitting, driving    Pain Relieving Factors  meds, repositioning, exercises                       [facility] Adult PT Treatment/Exercise - 10/22/18 0937      Lumbar Exercises: Stretches   Single Knee to Chest Stretch  Right;Left;2 reps;30 seconds    Double Knee to Chest Stretch  2 reps;30 seconds    Lower Trunk Rotation  2 reps;30 seconds   bil   Other Lumbar Stretch Exercise  sidelying book openers 3x30 sec bil; childs pose 3x30 sec; seated overhead reach 3x15 sec bil      Lumbar Exercises: Aerobic   Nustep  L5 x 8 min      Lumbar Exercises: Standing   Heel Raises  20 reps    Row  Both;10 reps;Theraband    Theraband Level (Row)  Level 3 (Green)    Shoulder Extension  Both;10 reps;Theraband    Theraband Level (Shoulder Extension)  Level 3 [name])             PT Education - 10/22/18 1011    Education Details  using TENS unit while driving or prolonged sitting    Person(s) Educated  Patient    Methods  Explanation    Comprehension  Verbalized understanding          PT Long Term Goals - 10/09/18 1013      PT LONG TERM GOAL #1   Title  independent with HEP    Baseline  10/16: met as of today; plan to progress    Time  6    Period  Weeks    Status  On-going      PT LONG TERM GOAL #2   Title  FOTO score improved to </= 55% limited for improved function  Time  6    Period  Weeks    Status  Achieved      PT LONG TERM  GOAL #3   Title  perform thoracolumbar flexion without increase in pain    Baseline  10/16: improved; mild increase in pain but report no sharp pain as described on eval    Time  6    Period  Weeks    Status  Partially Met      PT LONG TERM GOAL #4   Title  demonstrate at least 4/5 bil LE strength for improved function    Time  6    Period  Weeks    Status  On-going    Target Date  11/06/18      PT LONG TERM GOAL #5   Title  verbalize compliance with community fitness and plans to continue at d/c    Status  New    Target Date  11/06/18            Plan - 10/22/18 1012    Clinical Impression Statement  Pt missed last week due to headache, and report pain in back is intermittent at this time.  Educated on expectations of pain and that goal isn't to necessarily be pain free but know how to manage and decrease frequency and duration of pain.  Pt feels her pain is improved overall.  Continues to be compliant with HEP and going to gym.      Rehab Potential  Good    PT Frequency  2x / week    PT Duration  6 weeks    PT Treatment/Interventions  ADLs/Self Care Home Management;Cryotherapy;Electrical Stimulation;Moist Heat;Traction;Ultrasound;Stair training;Functional mobility training;Therapeutic activities;Therapeutic exercise;Patient/family education;Neuromuscular re-education;Manual techniques;Dry needling    PT Next Visit Plan  continue hip/core strengthening.  Progress mobility, DN/manual/modalities PRN (trying to wean from modalities and focus on exercise); MET PRN    PT Home Exercise Plan  Access Code: [email or web address]     Consulted and Agree with Plan of Care  Patient       Patient will benefit from skilled therapeutic intervention in order to improve the following deficits and impairments:  Difficulty walking, Decreased mobility, Increased muscle spasms, Improper body mechanics, Postural dysfunction, Impaired flexibility, Increased fascial restricitons, Decreased strength  Visit  Diagnosis: Pain in thoracic spine  Muscle weakness (generalized)  Other muscle spasm     Problem List Patient Active Problem List   Diagnosis Date Noted  . Excessive postexertional fatigue 04/02/2017  . Pneumonia due to virus 04/02/2017  . Chronic interstitial cystitis 02/01/2017  . Acute pharyngitis 01/02/2017  . Dysphagia   . Esophageal reflux   . Allergic rhinitis 01/25/2016  . Shortness of breath   . Left hip pain 04/30/2015  . Sciatica 04/30/2015  . Knee pain, right 04/30/2015  . Leukocytes in urine 04/30/2015  . Hyperlipidemia 03/01/2015  . Bilateral hand numbness 03/01/2015  . Pulmonary nodules 02/12/2015  . External hemorrhoids 02/05/2015  . Chronic night sweats 01/08/2015  . Atypical chest pain 01/01/2015  . Chest pain 01/01/2015  . Renal insufficiency 07/16/2014  . Sepsis secondary to UTI ([place]) 02/05/2014  . Grief reaction 12/29/2013  . Urinary hesitancy 12/04/2013  . Abdominal pain, other specified site 10/26/2013  . Headache(784.0) 10/13/2013  . Pain, joint, multiple sites 08/18/2013  . Vasculitis ([place]) 07/29/2013  . ANA positive 07/29/2013  . Other and unspecified hyperlipidemia 07/12/2013  . Contusion, chest wall 04/30/2013  . Malignant neoplasm of lower-outer quadrant of right breast of female, estrogen  receptor positive ([place]) 01/03/2013  . Splenic lesion 09/02/2012  . Asthma, allergic 08/05/2012  . GERD (gastroesophageal reflux disease) 06/03/2012  . Former heavy tobacco smoker 06/03/2012  . Edema 04/08/2012  . Stricture and stenosis of esophagus 10/19/2011  . Constipation - functional 07/21/2011  . Nonspecific (abnormal) findings on radiological and other examination of biliary tract 07/21/2011  . Osteoporosis 04/17/2011  . POSTMENOPAUSAL STATUS 01/27/2011  . FIBROCYSTIC BREAST DISEASE, HX OF 10/18/2010  . Essential hypertension 08/25/2010  . CAD in native artery 08/25/2010  . TOBACCO ABUSE, HX OF 08/25/2010  . GENERALIZED ANXIETY DISORDER  08/03/2010  . FIBROMYALGIA 01/11/2010  . SKIN CANCER, HX OF 01/11/2010      Laureen Abrahams, PT, DPT 10/22/18 10:14 AM    [facility] [place], [place], [address] Phone: [phone]   Fax:  [phone]  Name: [name] MRN: [id number] Date of Birth: October 25, 1946

## 2018-10-24 ENCOUNTER — Other Ambulatory Visit: Payer: Self-pay | Admitting: Pharmacist Clinician (PhC)/ Clinical Pharmacy Specialist

## 2018-10-24 DIAGNOSIS — E785 Hyperlipidemia, unspecified: Secondary | ICD-10-CM

## 2018-10-24 DIAGNOSIS — M545 Low back pain: Secondary | ICD-10-CM | POA: Diagnosis not present

## 2018-10-24 DIAGNOSIS — M542 Cervicalgia: Secondary | ICD-10-CM | POA: Diagnosis not present

## 2018-10-24 DIAGNOSIS — J0100 Acute maxillary sinusitis, unspecified: Secondary | ICD-10-CM | POA: Diagnosis not present

## 2018-10-24 DIAGNOSIS — I10 Essential (primary) hypertension: Secondary | ICD-10-CM | POA: Diagnosis not present

## 2018-10-30 ENCOUNTER — Encounter: Payer: Self-pay | Admitting: Physical Therapy

## 2018-10-30 ENCOUNTER — Ambulatory Visit: Payer: Medicare Other | Admitting: Physical Therapy

## 2018-10-30 DIAGNOSIS — M546 Pain in thoracic spine: Secondary | ICD-10-CM

## 2018-10-30 DIAGNOSIS — M62838 Other muscle spasm: Secondary | ICD-10-CM

## 2018-10-30 DIAGNOSIS — M6281 Muscle weakness (generalized): Secondary | ICD-10-CM | POA: Diagnosis not present

## 2018-10-30 NOTE — Therapy (Addendum)
[place], [place], [address] Phone: [phone]   Fax:  [phone]  Physical Therapy Treatment/Discharge  Patient Details  Name: [name] MRN: [id number] Date of Birth: 08-26-1946 Referring Provider (PT): Dr. Melina Schools   Encounter Date: 10/30/2018  PT End of Session - 10/30/18 1103    Visit Number  12    Number of Visits  15    Date for PT Re-Evaluation  11/06/18    Authorization Type  [employer]    PT Start Time  1028    PT Stop Time  1058    PT Time Calculation (min)  30 min    Activity Tolerance  Patient tolerated treatment well    Behavior During Therapy  [facility] for tasks assessed/performed       Past Medical History:  Diagnosis Date  . Allergy   . Anemia   . Anxiety    past hx   . Arthritis   . Asthma   . Atrial flutter ([place])    past history- not current  . CAD (coronary artery disease) CARDIOLOGIST--  DR Angelena Form   mild non-obstructive cad  . Cancer ([place])    right  . Cataract    bilaterally removed   . Chronic constipation   . Chronic kidney disease    interstitial cystitis  . COPD (chronic obstructive pulmonary disease) ([place])   . Depression    past hx   . Family history of malignant hyperthermia    father had this  . Fibromyalgia   . Frequency of urination   . GERD (gastroesophageal reflux disease)   . H/O hiatal hernia   . History of basal cell carcinoma excision    X2  . History of breast cancer ONCOLOGIST-- DR Jana Hakim---  NO RECURRANCE   DX 07/2012;  LOW GRADE DCIS  ER+PR+  ----  S/P RIGHT LUMPECTOMY WITH NEGATIVE MARGINS/   RADIATION ENDED 11/2012  . History of chronic bronchitis   . History of colonic polyps   . History of tachycardia    CONTROLLED  WITH ATENOLOL  . Hyperlipidemia   . Hypertension   . Neuromuscular disorder (HCC)    fibromyalgia  . Osteoporosis   . Pelvic pain   . Personal history of radiation therapy   . S/P radiation therapy  11/12/12 - 12/05/12   right Breast  . Sepsis ([place]) 2014   from UTI   . Sinus headache   . Urgency of urination     Past Surgical History:  Procedure Laterality Date  . [place] STUDY N/A 04/03/2016   Procedure: [place] STUDY;  Surgeon: Manus Gunning, MD;  [facility];  Service: Gastroenterology;  Laterality: N/A;  . BREAST LUMPECTOMY Right 10-11-2012   W/ SLN BX  . CARDIAC CATHETERIZATION  09-13-2007  DR Lia Foyer   WELL-PRESERVED LVF/  DIFFUSE SCATTERED CORONARY CALCIFACATION AND ATHEROSCLEROSIS WITHOUT OBSTRUCTION  . CARDIAC CATHETERIZATION  08-04-2010  DR Angelena Form   NON-OBSTRUCTIVE CAD/  pLAD 40%/  oLAD 30%/  mLAD 30%/  pRCA 30%/  EF 60%  . CARDIOVASCULAR STRESS TEST  06-18-2012  DR McALHANY   LOW RISK NUCLEAR STUDY/  SMALL FIXED AREA OF MODERATELY DECREASED UPTAKE IN ANTEROSEPTAL WALL WHICH MAY BE ARTIFACTUAL/  NO ISCHEMIA/  EF 68%  . COLONOSCOPY  09-29-2010  . CYSTOSCOPY    . CYSTOSCOPY WITH HYDRODISTENSION AND BIOPSY N/A 03/06/2014   Procedure: CYSTOSCOPY/HYDRODISTENSION/ INSTILATION OF MARCAINE AND PYRIDIUM;  Surgeon: Ailene Rud,  MD;  Location: [facility];  Service: Urology;  Laterality: N/A;  . ESOPHAGEAL MANOMETRY N/A 04/03/2016   Procedure: ESOPHAGEAL MANOMETRY (EM);  Surgeon: Manus Gunning, MD;  [facility];  Service: Gastroenterology;  Laterality: N/A;  . NASAL SINUS SURGERY  1985  . ORIF RIGHT ANKLE  FX  2006  . POLYPECTOMY    . REMOVAL VOCAL CORD CYST  FEB 2014  . RIGHT BREAST BX  08-23-2012  . RIGHT HAND SURGERY  X3  LAST ONE 2009   INCLUDES  ORIF RIGHT 5TH FINGER AND REVISION TWICE  . TONSILLECTOMY AND ADENOIDECTOMY  AGE 89  . TOTAL ABDOMINAL HYSTERECTOMY W/ BILATERAL SALPINGOOPHORECTOMY  1982   W/  APPENDECTOMY  . TRANSTHORACIC ECHOCARDIOGRAM  06-24-2012   GRADE I DIASTOLIC DYSFUNCTION/  EF 55-60%/  MILD MR  . UPPER GASTROINTESTINAL ENDOSCOPY      There were no vitals filed for this  visit.  Subjective Assessment - 10/30/18 1031    Subjective  doing well except for sinus issues.  back and neck are feeling great.    Patient Stated Goals  be able to perform toileting without pain, sleep better at night    Currently in Pain?  No/denies         [facility] PT Assessment - 10/30/18 1045      Assessment   Medical Diagnosis  scoliosis    Referring Provider (PT)  Dr. Melina Schools      Observation/Other Assessments   Focus on Therapeutic Outcomes (FOTO)   46 (54% limited)      AROM   Overall AROM Comments  thoracolumbar flexion performed without pain      Strength   Right Hip Flexion  3+/5    Right Hip Extension  4/5    Right Hip ABduction  5/5    Left Hip Flexion  4/5    Left Hip Extension  4+/5    Left Hip ABduction  5/5    Right Knee Flexion  4/5    Right Knee Extension  5/5    Left Knee Flexion  4/5    Left Knee Extension  5/5                   [facility] Adult PT Treatment/Exercise - 10/30/18 1032      Lumbar Exercises: Stretches   Single Knee to Chest Stretch  Right;Left;2 reps;30 seconds    Double Knee to Chest Stretch  2 reps;30 seconds    Lower Trunk Rotation  2 reps;30 seconds      Lumbar Exercises: Aerobic   Nustep  L5 x 8 min                  PT Long Term Goals - 10/30/18 1103      PT LONG TERM GOAL #1   Title  independent with HEP    Time  6    Period  Weeks    Status  Achieved      PT LONG TERM GOAL #2   Title  FOTO score improved to </= 55% limited for improved function    Time  6    Period  Weeks    Status  Achieved      PT LONG TERM GOAL #3   Title  perform thoracolumbar flexion without increase in pain    Time  6    Period  Weeks    Status  Achieved      PT LONG TERM GOAL #4  Title  demonstrate at least 4/5 bil LE strength for improved function    Time  6    Period  Weeks    Status  Achieved      PT LONG TERM GOAL #5   Title  verbalize compliance with community fitness and plans to continue at d/c     Status  Achieved            Plan - 10/30/18 1104    Clinical Impression Statement  Pt has met all goals and is ready for d/c.  Pt is able to verbalize pain management strategies as well as compliant with continued HEP and community fitness.  Plan for d/c today.    Rehab Potential  Good    PT Frequency  2x / week    PT Duration  6 weeks    PT Treatment/Interventions  ADLs/Self Care Home Management;Cryotherapy;Electrical Stimulation;Moist Heat;Traction;Ultrasound;Stair training;Functional mobility training;Therapeutic activities;Therapeutic exercise;Patient/family education;Neuromuscular re-education;Manual techniques;Dry needling    PT Next Visit Plan  d/c PT today    PT Home Exercise Plan  Access Code: [email or web address]     Consulted and Agree with Plan of Care  Patient       Patient will benefit from skilled therapeutic intervention in order to improve the following deficits and impairments:  Difficulty walking, Decreased mobility, Increased muscle spasms, Improper body mechanics, Postural dysfunction, Impaired flexibility, Increased fascial restricitons, Decreased strength  Visit Diagnosis: Pain in thoracic spine  Muscle weakness (generalized)  Other muscle spasm     Problem List Patient Active Problem List   Diagnosis Date Noted  . Excessive postexertional fatigue 04/02/2017  . Pneumonia due to virus 04/02/2017  . Chronic interstitial cystitis 02/01/2017  . Acute pharyngitis 01/02/2017  . Dysphagia   . Esophageal reflux   . Allergic rhinitis 01/25/2016  . Shortness of breath   . Left hip pain 04/30/2015  . Sciatica 04/30/2015  . Knee pain, right 04/30/2015  . Leukocytes in urine 04/30/2015  . Hyperlipidemia 03/01/2015  . Bilateral hand numbness 03/01/2015  . Pulmonary nodules 02/12/2015  . External hemorrhoids 02/05/2015  . Chronic night sweats 01/08/2015  . Atypical chest pain 01/01/2015  . Chest pain 01/01/2015  . Renal insufficiency 07/16/2014  . Sepsis  secondary to UTI ([place]) 02/05/2014  . Grief reaction 12/29/2013  . Urinary hesitancy 12/04/2013  . Abdominal pain, other specified site 10/26/2013  . Headache(784.0) 10/13/2013  . Pain, joint, multiple sites 08/18/2013  . Vasculitis ([place]) 07/29/2013  . ANA positive 07/29/2013  . Other and unspecified hyperlipidemia 07/12/2013  . Contusion, chest wall 04/30/2013  . Malignant neoplasm of lower-outer quadrant of right breast of female, estrogen receptor positive ([place]) 01/03/2013  . Splenic lesion 09/02/2012  . Asthma, allergic 08/05/2012  . GERD (gastroesophageal reflux disease) 06/03/2012  . Former heavy tobacco smoker 06/03/2012  . Edema 04/08/2012  . Stricture and stenosis of esophagus 10/19/2011  . Constipation - functional 07/21/2011  . Nonspecific (abnormal) findings on radiological and other examination of biliary tract 07/21/2011  . Osteoporosis 04/17/2011  . POSTMENOPAUSAL STATUS 01/27/2011  . FIBROCYSTIC BREAST DISEASE, HX OF 10/18/2010  . Essential hypertension 08/25/2010  . CAD in native artery 08/25/2010  . TOBACCO ABUSE, HX OF 08/25/2010  . GENERALIZED ANXIETY DISORDER 08/03/2010  . FIBROMYALGIA 01/11/2010  . SKIN CANCER, HX OF 01/11/2010      Laureen Abrahams, PT, DPT 10/30/18 11:05 AM    [facility] [place], [place], [address] Phone: [phone]   Fax:  [phone]  Name: Nelly Scriven MRN: [id number] Date of Birth: [birth year]      PHYSICAL THERAPY DISCHARGE SUMMARY  Visits from Start of Care: 12  Current functional level related to goals / functional outcomes: See above   Remaining deficits: See above   Education / Equipment: HEP  Plan: Patient agrees to discharge.  Patient goals were met. Patient is being discharged due to meeting the stated rehab goals.  ?????     Laureen Abrahams, PT, DPT 10/30/18 12:08 PM  [facility] [place], [place] [address]  [phone] (office) [phone] (fax)

## 2018-11-05 ENCOUNTER — Encounter: Payer: Self-pay | Admitting: Gynecology

## 2018-11-05 ENCOUNTER — Ambulatory Visit: Payer: Medicare Other | Admitting: Gynecology

## 2018-11-05 VITALS — BP 118/76 | Ht 65.0 in | Wt 134.0 lb

## 2018-11-05 DIAGNOSIS — N760 Acute vaginitis: Secondary | ICD-10-CM

## 2018-11-05 DIAGNOSIS — Z9289 Personal history of other medical treatment: Secondary | ICD-10-CM | POA: Diagnosis not present

## 2018-11-05 DIAGNOSIS — Z1272 Encounter for screening for malignant neoplasm of vagina: Secondary | ICD-10-CM

## 2018-11-05 DIAGNOSIS — Z01419 Encounter for gynecological examination (general) (routine) without abnormal findings: Secondary | ICD-10-CM

## 2018-11-05 DIAGNOSIS — N952 Postmenopausal atrophic vaginitis: Secondary | ICD-10-CM

## 2018-11-05 DIAGNOSIS — Z853 Personal history of malignant neoplasm of breast: Secondary | ICD-10-CM

## 2018-11-05 DIAGNOSIS — M810 Age-related osteoporosis without current pathological fracture: Secondary | ICD-10-CM | POA: Diagnosis not present

## 2018-11-05 MED ORDER — ESTRADIOL 10 MCG VA TABS: 1.0000 | ORAL_TABLET | VAGINAL | 11 refills | Status: DC

## 2018-11-05 MED ORDER — DENOSUMAB 60 MG/ML ~~LOC~~ SOSY
60.0000 mg | PREFILLED_SYRINGE | Freq: Once | SUBCUTANEOUS | Status: AC
  Administered 2018-11-05: 60 mg via SUBCUTANEOUS

## 2018-11-05 NOTE — Addendum Note (Signed)
Addended by: Nelva Nay on: 11/05/2018 12:49 PM   Modules accepted: Orders

## 2018-11-05 NOTE — Patient Instructions (Signed)
Follow-up for the bone density as scheduled. 

## 2018-11-05 NOTE — Progress Notes (Signed)
    [name] Dec 08, 1946 [id number]        72 y.o.  G3P3 for breast and pelvic exam.  Several issues noted below.  Past medical history,surgical history, problem list, medications, allergies, family history and social history were all reviewed and documented as reviewed in the EPIC chart.  ROS:  Performed with pertinent positives and negatives included in the history, assessment and plan.   Additional significant findings : None   Exam: [name] assistant Vitals:   11/05/18 1142  BP: 118/76  Weight: 134 lb (60.8 kg)  Height: 5\' 5"  (1.651 m)   Body mass index is 22.3 kg/m.  General appearance:  Normal affect, orientation and appearance. Skin: Grossly normal HEENT: Without gross lesions.  No cervical or supraclavicular adenopathy. Thyroid normal.  Lungs:  Clear without wheezing, rales or rhonchi Cardiac: RR, without RMG Abdominal:  Soft, nontender, without masses, guarding, rebound, organomegaly or hernia Breasts:  Examined lying and sitting without masses, retractions, discharge or axillary adenopathy.  Well-healed right lumpectomy scar. Pelvic:  Ext, BUS, Vagina: With atrophic changes  Adnexa: Without masses or tenderness    Anus and perineum: Normal   Rectovaginal: Normal sphincter tone without palpated masses or tenderness.    Assessment/Plan:  72 y.o. G3P3 female for breast and pelvic exam.  1. Postmenopausal/atrophic genital changes.  Status post TAH/BSO in the past.  History of significant vaginal dryness and dyspareunia.  Currently on Vagifem 10 mcg twice weekly.  Is doing well with this and wants to continue.  We again discussed the issues particularly in a breast cancer survivor.  Absorption with systemic effect to include thrombosis as well as increased risk of breast cancer recurrence reviewed.  The patient is comfortable with continuing and I refilled her times a year. 2. History of chronic vaginitis with questionable vaginal fistula.  Unable to document despite  having studies in the past.  Is doing much better now on the Vagifem and using vinegar douche intermittently.  Feels that she has this under control and wants no further evaluation. 3. History of VAIN.  Used 5-FU following hysterectomy.  History of positive HPV in the past.  Subsequent Pap smears have had negative HPV.  Last Pap smear 2018.  Given her history we will continue with annual cytology for now.  Pap smear done today. 4. History of right breast cancer.  Exam NED.  Mammography coming due end of December. 5. Colonoscopy 2017.  Repeat at their recommended interval. 6. Osteoporosis.  DEXA 2017.  On Prolia per Dr. Toney Rakes.  Recommend follow-up DEXA now and she will schedule.  Continue on Prolia. 7. Health maintenance.  No routine lab work done as patient does this elsewhere.  Follow-up 1 year, sooner as needed.   Anastasio Auerbach MD, 12:36 PM 11/05/2018

## 2018-11-06 ENCOUNTER — Encounter: Payer: Medicare Other | Admitting: Physical Therapy

## 2018-11-06 DIAGNOSIS — K625 Hemorrhage of anus and rectum: Secondary | ICD-10-CM | POA: Diagnosis not present

## 2018-11-06 LAB — PAP IG W/ RFLX HPV ASCU

## 2018-11-07 DIAGNOSIS — N3010 Interstitial cystitis (chronic) without hematuria: Secondary | ICD-10-CM | POA: Diagnosis not present

## 2018-11-07 DIAGNOSIS — N823 Fistula of vagina to large intestine: Secondary | ICD-10-CM | POA: Diagnosis not present

## 2018-11-13 DIAGNOSIS — J0101 Acute recurrent maxillary sinusitis: Secondary | ICD-10-CM | POA: Diagnosis not present

## 2018-11-13 DIAGNOSIS — M7711 Lateral epicondylitis, right elbow: Secondary | ICD-10-CM | POA: Diagnosis not present

## 2018-11-13 DIAGNOSIS — I10 Essential (primary) hypertension: Secondary | ICD-10-CM | POA: Diagnosis not present

## 2018-11-13 DIAGNOSIS — M7712 Lateral epicondylitis, left elbow: Secondary | ICD-10-CM | POA: Diagnosis not present

## 2018-11-15 NOTE — Telephone Encounter (Signed)
PROLIA GIVEN 11/05/18 NEXT INJECTION 05/07/2019

## 2018-12-05 DIAGNOSIS — Z0000 Encounter for general adult medical examination without abnormal findings: Secondary | ICD-10-CM | POA: Diagnosis not present

## 2018-12-05 DIAGNOSIS — I10 Essential (primary) hypertension: Secondary | ICD-10-CM | POA: Diagnosis not present

## 2018-12-12 ENCOUNTER — Encounter: Payer: Self-pay | Admitting: Gastroenterology

## 2018-12-12 ENCOUNTER — Ambulatory Visit: Payer: Medicare Other | Admitting: Gastroenterology

## 2018-12-12 VITALS — BP 108/72 | HR 66 | Ht 65.0 in | Wt 134.5 lb

## 2018-12-12 DIAGNOSIS — K625 Hemorrhage of anus and rectum: Secondary | ICD-10-CM | POA: Diagnosis not present

## 2018-12-12 DIAGNOSIS — K5900 Constipation, unspecified: Secondary | ICD-10-CM | POA: Diagnosis not present

## 2018-12-12 DIAGNOSIS — R109 Unspecified abdominal pain: Secondary | ICD-10-CM | POA: Diagnosis not present

## 2018-12-12 DIAGNOSIS — R1310 Dysphagia, unspecified: Secondary | ICD-10-CM

## 2018-12-12 DIAGNOSIS — R140 Abdominal distension (gaseous): Secondary | ICD-10-CM

## 2018-12-12 MED ORDER — LINACLOTIDE 145 MCG PO CAPS: 145.0000 ug | ORAL_CAPSULE | Freq: Every day | ORAL | 0 refills | Status: DC

## 2018-12-12 MED ORDER — SUPREP BOWEL PREP KIT 17.5-3.13-1.6 GM/177ML PO SOLN: ORAL | 0 refills | Status: DC

## 2018-12-12 NOTE — Patient Instructions (Addendum)
If you are age 73 or older, your body mass index should be between 23-30. Your Body mass index is 22.38 kg/m. If this is out of the aforementioned range listed, please consider follow up with your Primary Care Provider.  If you are age 39 or younger, your body mass index should be between 19-25. Your Body mass index is 22.38 kg/m. If this is out of the aformentioned range listed, please consider follow up with your Primary Care Provider.    You have been scheduled for an endoscopy and colonoscopy. Please follow the written instructions given to you at your visit today. Please pick up your prep supplies at the pharmacy within the next 1-3 days. If you use inhalers (even only as needed), please bring them with you on the day of your procedure. Your physician has requested that you go to www.startemmi.com and enter the access code given to you at your visit today. This web site gives a general overview about your procedure. However, you should still follow specific instructions given to you by our office regarding your preparation for the procedure.  We have given you samples of the following medication to take: Linzess 145 mcg: take once a day  We are giving you a low FOD -Map diet to follow.  Thank you for entrusting me with your care and for choosing [facility], Dr. Dundas Cellar

## 2018-12-12 NOTE — Progress Notes (Signed)
HPI :  72 year old female here for follow-up visit to discuss a few issues.:  We've evaluated her over the past year for possible rectovaginal fistula. She's been endorsing passing gas and questionable leakage of stool from her vagina. She's had an MRI of her pelvis, flex sigmoidoscopy, barium enema without clear evidence of the fistula. She's been seen by colorectal surgery for this as well.  She's been having some constipation that has been bothering her. Having a bowel movement once every 7 or 8 days or so.using some MiraLAX which can help however she has a hard time being compliant with this, asks about other options. She has had some rectal bleeding a few months ago in the setting of constipation. One episode of a significant amount of bright red blood in the toilet and on the toilet paper. She's had a few scant episodes of bleeding since that time but she doesn't think anything significant. She feels this is most consistent with bleeding from hemorrhoids in the setting of her constipation. She also endorses some abdominal pains in her lower to mid abdomen which can come and go. Some of her pain does improve with having a bowel movement. She does endorse some significant bloating and abdominal distension which bothers her intermittently.  She otherwise has had a long-standing history of dysphagia. She has remotely had strictures in the past that have been dilated. She had an esophageal manometry in 2017 which raises the question of possible nutcracker esophagus ( although criteria to diagnose that has since changed and she would not meet criteria for at this time). PH study - Demeester score of 10, symptom index of 0. Tried althoids which helped somewhat. Recommended a trial of diltiazem 60mg  up to TID. She never tried it. I performed another endoscopy in August of last year. Biopsies taken of normal esophagus showed no evidence of EOE. Empiric dilation was performed with 17 mm savory dilation with a  small rent noted at the [facility]. She states this provided significant benefit for her dysphagia for upwards of 9 months at least. She reports over time symptoms have started coming back. She has dysphagia to solids and liquids and tablets and her sternal notch. She's been eating small bites of food.  Prior endoscopic evaluation: Colonoscopy 01/05/16 - one small adenoma, melanosis coli EGD - 09/19/11 - stricture at [facility], dilated to 29mm ballooon EGD 12/30/10 - normal exam, empiric dilation to 42mm EGD 08/2710 - stricture at GEJ, dilated to 55mm  Flex sig 07/19/17 - no obvious fistula EGD 08/13/2017 - 2cm HH, dilation with 44mm Savory with small wrent at [facility], biopsies taken to rule out EoE and negative    Past Medical History:  Diagnosis Date  . Allergy   . Anemia   . Anxiety    past hx   . Arthritis   . Asthma   . Atrial flutter ([place])    past history- not current  . CAD (coronary artery disease) CARDIOLOGIST--  DR Angelena Form   mild non-obstructive cad  . Cancer ([place])    right  . Cataract    bilaterally removed   . Chronic constipation   . Chronic kidney disease    interstitial cystitis  . COPD (chronic obstructive pulmonary disease) ([place])   . Depression    past hx   . Family history of malignant hyperthermia    father had this  . Fibromyalgia   . Frequency of urination   . GERD (gastroesophageal reflux disease)   . H/O hiatal hernia   .  History of basal cell carcinoma excision    X2  . History of breast cancer ONCOLOGIST-- DR Jana Hakim---  NO RECURRANCE   DX 07/2012;  LOW GRADE DCIS  ER+PR+  ----  S/P RIGHT LUMPECTOMY WITH NEGATIVE MARGINS/   RADIATION ENDED 11/2012  . History of chronic bronchitis   . History of colonic polyps   . History of tachycardia    CONTROLLED  WITH ATENOLOL  . Hyperlipidemia   . Hypertension   . Neuromuscular disorder ([facility])    fibromyalgia  . Osteoporosis   . Pelvic pain   . Personal history of radiation therapy   . S/P radiation therapy 11/12/12 -  12/05/12   right Breast  . Sepsis ([place]) 2014   from UTI   . Sinus headache   . Urgency of urination      Past Surgical History:  Procedure Laterality Date  . [place] STUDY N/A 04/03/2016   Procedure: [place] STUDY;  Surgeon: Manus Gunning, MD;  Location: [facility];  Service: Gastroenterology;  Laterality: N/A;  . BREAST LUMPECTOMY Right 10-11-2012   W/ SLN BX  . CARDIAC CATHETERIZATION  09-13-2007  DR Lia Foyer   WELL-PRESERVED LVF/  DIFFUSE SCATTERED CORONARY CALCIFACATION AND ATHEROSCLEROSIS WITHOUT OBSTRUCTION  . CARDIAC CATHETERIZATION  08-04-2010  DR Angelena Form   NON-OBSTRUCTIVE CAD/  pLAD 40%/  oLAD 30%/  mLAD 30%/  pRCA 30%/  EF 60%  . CARDIOVASCULAR STRESS TEST  06-18-2012  DR McALHANY   LOW RISK NUCLEAR STUDY/  SMALL FIXED AREA OF MODERATELY DECREASED UPTAKE IN ANTEROSEPTAL WALL WHICH MAY BE ARTIFACTUAL/  NO ISCHEMIA/  EF 68%  . COLONOSCOPY  09-29-2010  . CYSTOSCOPY    . CYSTOSCOPY WITH HYDRODISTENSION AND BIOPSY N/A 03/06/2014   Procedure: CYSTOSCOPY/HYDRODISTENSION/ INSTILATION OF MARCAINE AND PYRIDIUM;  Surgeon: Ailene Rud, MD;  Location: [facility];  Service: Urology;  Laterality: N/A;  . ESOPHAGEAL MANOMETRY N/A 04/03/2016   Procedure: ESOPHAGEAL MANOMETRY (EM);  Surgeon: Manus Gunning, MD;  Location: [facility];  Service: Gastroenterology;  Laterality: N/A;  . NASAL SINUS SURGERY  1985  . ORIF RIGHT ANKLE  FX  2006  . POLYPECTOMY    . REMOVAL VOCAL CORD CYST  FEB 2014  . RIGHT BREAST BX  08-23-2012  . RIGHT HAND SURGERY  X3  LAST ONE 2009   INCLUDES  ORIF RIGHT 5TH FINGER AND REVISION TWICE  . TONSILLECTOMY AND ADENOIDECTOMY  AGE 44  . TOTAL ABDOMINAL HYSTERECTOMY W/ BILATERAL SALPINGOOPHORECTOMY  1982   W/  APPENDECTOMY  . TRANSTHORACIC ECHOCARDIOGRAM  06-24-2012   GRADE I DIASTOLIC DYSFUNCTION/  EF 55-60%/  MILD MR  . UPPER GASTROINTESTINAL ENDOSCOPY     Family History  Problem Relation Age of Onset  . Rectal  cancer Mother   . Colon cancer Mother   . Breast cancer Mother   . Pancreatic cancer Mother   . Breast cancer Maternal Aunt        breast  . Irritable bowel syndrome Son   . Irritable bowel syndrome Daughter   . Colon cancer Father   . Colon polyps Father   . Breast cancer Paternal Aunt   . Breast cancer Paternal Aunt   . Esophageal cancer Neg Hx   . Stomach cancer Neg Hx    Social History   Tobacco Use  . Smoking status: Former Smoker    Packs/day: 1.00    Years: 15.00    Pack years: 15.00    Types: Cigarettes  Last attempt to quit: 05/27/2005    Years since quitting: 13.5  . Smokeless tobacco: Never Used  Substance Use Topics  . Alcohol use: No    Alcohol/week: 0.0 standard drinks  . Drug use: No   Current Outpatient Medications  Medication Sig Dispense Refill  . acetaminophen (TYLENOL) 500 MG tablet Take 1,000 mg by mouth every 6 (six) hours as needed for mild pain or headache.     . albuterol (PROVENTIL HFA;VENTOLIN HFA) 108 (90 BASE) MCG/ACT inhaler Inhale 2 puffs into the lungs every 4 (four) hours as needed. 1 Inhaler 5  . aspirin EC 81 MG tablet Take 1 tablet (81 mg total) by mouth daily. 90 tablet 3  . atenolol (TENORMIN) 25 MG tablet Take 1 tablet (25 mg total) by mouth 3 (three) times daily before meals. (Patient taking differently: Take 25 mg by mouth 2 (two) times daily. ) 90 tablet 11  . denosumab (PROLIA) 60 MG/ML SOSY injection Inject 60 mg into the skin every 6 (six) months.    . DULoxetine (CYMBALTA) 60 MG capsule Take 120 mg by mouth daily.    . Estradiol 10 MCG TABS vaginal tablet Place 1 tablet (10 mcg total) vaginally 2 (two) times a week. 8 tablet 11  . Evolocumab (REPATHA SURECLICK) 161 MG/ML SOAJ Inject 140 mg into the skin every 14 (fourteen) days. 1 pen 12  . fluticasone (FLONASE) 50 MCG/ACT nasal spray Place 1 spray into the nose as needed for allergies.     . furosemide (LASIX) 20 MG tablet Take 20 mg by mouth as needed for edema.    .  gabapentin (NEURONTIN) 300 MG capsule Take 1 capsule (300 mg total) by mouth 2 (two) times daily. 60 capsule 6  . loratadine (CLARITIN) 10 MG tablet Take 1 tablet (10 mg total) by mouth daily. 30 tablet 11  . omeprazole (PRILOSEC) 40 MG capsule Take 40 mg by mouth 2 (two) times daily.    . pentosan polysulfate (ELMIRON) 100 MG capsule Take 1 capsule (100 mg total) by mouth 3 (three) times daily. Reported on 01/05/2016 (Patient taking differently: Take 100 mg by mouth 3 (three) times daily with meals as needed. Reported on 01/05/2016) 90 capsule 11  . traMADol (ULTRAM) 50 MG tablet Take 1-2 tablets (50-100 mg total) by mouth 3 (three) times daily as needed for moderate pain. (Patient taking differently: Take 50-100 mg by mouth 3 (three) times daily as needed for moderate pain (pt uses TID). ) 180 tablet 0  . traZODone (DESYREL) 100 MG tablet Take 1 tablet (100 mg total) by mouth at bedtime. 180 tablet 1  . amLODipine (NORVASC) 5 MG tablet Take 1 tablet (5 mg total) by mouth daily. (Patient taking differently: Take 5 mg by mouth every evening. ) 90 tablet 3  . losartan (COZAAR) 100 MG tablet Take 1 tablet (100 mg total) by mouth daily. 90 tablet 3   Current Facility-Administered Medications  Medication Dose Route Frequency Provider Last Rate Last Dose  . 0.9 %  sodium chloride infusion  500 mL Intravenous Continuous Armbruster, Carlota Raspberry, MD       Facility-Administered Medications Ordered in Other Visits  Medication Dose Route Frequency Provider Last Rate Last Dose  . bupivacaine (MARCAINE) 0.5 % 10 mL, triamcinolone acetonide (KENALOG-40) 40 mg injection   Subcutaneous Once Carolan Clines, MD      . bupivacaine (MARCAINE) 0.5 % 15 mL, phenazopyridine (PYRIDIUM) 400 mg bladder mixture   Bladder Instillation Once Carolan Clines, MD  Allergies  Allergen Reactions  . Clindamycin Hcl Shortness Of Breath and Rash  . Penicillins Anaphylaxis  . Prednisone Shortness Of Breath and Rash  .  Codeine Hives and Other (See Comments)    headache  . Fluzone [Flu Virus Vaccine] Other (See Comments)    Local reaction at the site  . Latex Hives  . Pentazocine Lactate Other (See Comments)    HALLUCINATION  . Pneumococcal Vaccine Polyvalent Hives, Swelling and Other (See Comments)    REACTION: redness, swelling, and hives at injection site  . Tamoxifen Nausea And Vomiting and Other (See Comments)    HEADACHE     Review of Systems: All systems reviewed and negative except where noted in HPI.   Lab Results  Component Value Date   WBC 5.9 09/10/2017   HGB 12.2 09/10/2017   HCT 38.0 09/10/2017   MCV 94 09/10/2017   PLT 286 09/10/2017    Lab Results  Component Value Date   CREATININE 0.98 09/03/2018   BUN 15 09/03/2018   NA 139 09/03/2018   K 4.6 09/03/2018   CL 102 09/03/2018   CO2 25 09/03/2018    Lab Results  Component Value Date   ALT 12 09/03/2018   AST 19 09/03/2018   ALKPHOS 101 09/03/2018   BILITOT 0.3 09/03/2018     Physical Exam: BP 108/72   Pulse 66   Ht 5\' 5"  (1.651 m)   Wt 134 lb 8 oz (61 kg)   BMI 22.38 kg/m  Constitutional: Pleasant,well-developed, female in no acute distress. HEENT: Normocephalic and atraumatic. Conjunctivae are normal. No scleral icterus. Neck supple.  Cardiovascular: Normal rate, regular rhythm.  Pulmonary/chest: Effort normal and breath sounds normal. No wheezing, rales or rhonchi. Abdominal: Soft, nondistended, nontender.  There are no masses palpable. No hepatomegaly. Extremities: no edema Lymphadenopathy: No cervical adenopathy noted. Neurological: Alert and oriented to person place and time. Skin: Skin is warm and dry. No rashes noted. Psychiatric: Normal mood and affect. Behavior is normal.   ASSESSMENT AND PLAN: 72 year old female here for reassessment of the following issues:  Constipation / rectal bleeding / abdominal pain / possible rectovaginal fistula - she has been extensively evaluated for possible  rectovaginal fistula based off symptoms to include MRI of the pelvis, flexible sigmoidoscopy, barium enema, surgical exam, not have shown clear evidence of a fistula. She now has worsening constipation, with occasional abdominal pain, also with recent months of rectal bleeding. I suspect her symptoms are likely due to hemorrhoids in regards to her bleeding, however in light of the rest of her symptoms I offered her a full colonoscopy to reassess, ensure nothing concerning, per request of Dr. Dema Severin, although I think her abdominal pain and bloating could likewise be related to constipation. In the interim I discussed options for her constipation. She has a hard time being compliant with MiraLAX. We will try her on Linzess 145 g daily for the next 2 weeks. If this works for her she can call for prescription. Following discussion risks and benefits of colonoscopy she wanted to proceed. In regards to her bloating I also recommended a low FODMAP diet to see if that will help.  Dysphagia - as above, on last EGD she had no clear stenosis appreciated although she had significant benefit from dilation which lasted at least 9 months or so. She's had a prior barium study in 2011 in which the tablet hung in the mid and lower esophagus without any focal stenosis. Prior manometry and 2017 suggested possible  nutcracker esophagus although she would not meet criteria for based on changing criteria in recent years. Could be multifactorial. I offered her another endoscopy with empiric dilation at the time of her colonoscopy as this has about a significant benefit for her in the past. Following discussion of risks and benefits she wanted to proceed. Further recommendations pending the results.  [place] Cellar, MD [facility]

## 2018-12-16 ENCOUNTER — Encounter: Payer: Self-pay | Admitting: Gastroenterology

## 2018-12-19 ENCOUNTER — Telehealth: Payer: Self-pay | Admitting: Cardiology

## 2018-12-19 NOTE — Telephone Encounter (Signed)
[name] - fyi

## 2018-12-19 NOTE — Telephone Encounter (Signed)
New Message     Darryl from [employer] called and to say the patient has approval for Repatha and it's good for 14yr from today.

## 2018-12-20 ENCOUNTER — Telehealth: Payer: Self-pay

## 2018-12-20 ENCOUNTER — Telehealth: Payer: Self-pay | Admitting: Gastroenterology

## 2018-12-20 ENCOUNTER — Other Ambulatory Visit: Payer: Self-pay | Admitting: Oncology

## 2018-12-20 ENCOUNTER — Other Ambulatory Visit: Payer: Self-pay | Admitting: Pharmacist

## 2018-12-20 DIAGNOSIS — N644 Mastodynia: Secondary | ICD-10-CM

## 2018-12-20 DIAGNOSIS — Z9889 Other specified postprocedural states: Secondary | ICD-10-CM

## 2018-12-20 NOTE — Telephone Encounter (Signed)
Copied from [place] [id number]. Topic: General - Other >> Dec 20, 2018 12:26 PM Carolyn Stare wrote:  Pt said she was referred to Dr Charlett Blake by Dr Patrice Paradise and she is asking if Dr Charlett Blake will except her as a new pt

## 2018-12-20 NOTE — Telephone Encounter (Signed)
Called and spoke to pt. Let her know we have a sample of Suprep I can provide her.  I am Leaving it at the front des; She indicated she will pick it up today.

## 2018-12-20 NOTE — Telephone Encounter (Signed)
Patient states her insurance is not wanting to cover prep for colon on Tuesday 12.31.19. Patient states the suprep is about $100 at pharmacy. Patient wanting to know how to get prep approved or cheaper. Pt had ov on 12.19.19.

## 2018-12-20 NOTE — Telephone Encounter (Signed)
Samples of REPATHA 140MG  SURECLICK were given to the patient, quantity 2, Lot Number [id number]; EXP 02/2021

## 2018-12-23 DIAGNOSIS — Z79899 Other long term (current) drug therapy: Secondary | ICD-10-CM | POA: Diagnosis not present

## 2018-12-23 DIAGNOSIS — M179 Osteoarthritis of knee, unspecified: Secondary | ICD-10-CM | POA: Diagnosis not present

## 2018-12-23 DIAGNOSIS — M5136 Other intervertebral disc degeneration, lumbar region: Secondary | ICD-10-CM | POA: Diagnosis not present

## 2018-12-24 ENCOUNTER — Encounter: Payer: Self-pay | Admitting: Gastroenterology

## 2018-12-24 ENCOUNTER — Ambulatory Visit (AMBULATORY_SURGERY_CENTER): Payer: Medicare Other | Admitting: Gastroenterology

## 2018-12-24 VITALS — BP 122/70 | HR 67 | Temp 97.1°F | Resp 12 | Ht 65.0 in | Wt 134.0 lb

## 2018-12-24 DIAGNOSIS — D122 Benign neoplasm of ascending colon: Secondary | ICD-10-CM

## 2018-12-24 DIAGNOSIS — K635 Polyp of colon: Secondary | ICD-10-CM

## 2018-12-24 DIAGNOSIS — K3189 Other diseases of stomach and duodenum: Secondary | ICD-10-CM

## 2018-12-24 DIAGNOSIS — R1310 Dysphagia, unspecified: Secondary | ICD-10-CM | POA: Diagnosis not present

## 2018-12-24 DIAGNOSIS — K621 Rectal polyp: Secondary | ICD-10-CM | POA: Diagnosis not present

## 2018-12-24 DIAGNOSIS — D123 Benign neoplasm of transverse colon: Secondary | ICD-10-CM

## 2018-12-24 DIAGNOSIS — K625 Hemorrhage of anus and rectum: Secondary | ICD-10-CM | POA: Diagnosis not present

## 2018-12-24 DIAGNOSIS — D124 Benign neoplasm of descending colon: Secondary | ICD-10-CM

## 2018-12-24 DIAGNOSIS — D125 Benign neoplasm of sigmoid colon: Secondary | ICD-10-CM | POA: Diagnosis not present

## 2018-12-24 MED ORDER — SODIUM CHLORIDE 0.9 % IV SOLN: 500.0000 mL | Freq: Once | INTRAVENOUS | Status: DC

## 2018-12-24 NOTE — Patient Instructions (Signed)
YOU HAD AN ENDOSCOPIC PROCEDURE TODAY AT THE [facility]:   Refer to the procedure report that was given to you for any specific questions about what was found during the examination.  If the procedure report does not answer your questions, please call your gastroenterologist to clarify.  If you requested that your care partner not be given the details of your procedure findings, then the procedure report has been included in a sealed envelope for you to review at your convenience later.  YOU SHOULD EXPECT: Some feelings of bloating in the abdomen. Passage of more gas than usual.  Walking can help get rid of the air that was put into your GI tract during the procedure and reduce the bloating. If you had a lower endoscopy (such as a colonoscopy or flexible sigmoidoscopy) you may notice spotting of blood in your stool or on the toilet paper. If you underwent a bowel prep for your procedure, you may not have a normal bowel movement for a few days.  Please Note:  You might notice some irritation and congestion in your nose or some drainage.  This is from the oxygen used during your procedure.  There is no need for concern and it should clear up in a day or so.  SYMPTOMS TO REPORT IMMEDIATELY:   Following lower endoscopy (colonoscopy or flexible sigmoidoscopy):  Excessive amounts of blood in the stool  Significant tenderness or worsening of abdominal pains  Swelling of the abdomen that is new, acute  Fever of 100F or higher   Following upper endoscopy (EGD)  Vomiting of blood or coffee ground material  New chest pain or pain under the shoulder blades  Painful or persistently difficult swallowing  New shortness of breath  Fever of 100F or higher  Black, tarry-looking stools  For urgent or emergent issues, a gastroenterologist can be reached at any hour by calling [phone].   DIET:  We do recommend a small meal at first, but then you may proceed to your regular diet.  Drink  plenty of fluids but you should avoid alcoholic beverages for 24 hours.  ACTIVITY:  You should plan to take it easy for the rest of today and you should NOT DRIVE or use heavy machinery until tomorrow (because of the sedation medicines used during the test).    FOLLOW UP: Our staff will call the number listed on your records the next business day following your procedure to check on you and address any questions or concerns that you may have regarding the information given to you following your procedure. If we do not reach you, we will leave a message.  However, if you are feeling well and you are not experiencing any problems, there is no need to return our call.  We will assume that you have returned to your regular daily activities without incident.  If any biopsies were taken you will be contacted by phone or by letter within the next 1-3 weeks.  Please call us at [phone] if you have not heard about the biopsies in 3 weeks.    SIGNATURES/CONFIDENTIALITY: You and/or your care partner have signed paperwork which will be entered into your electronic medical record.  These signatures attest to the fact that that the information above on your After Visit Summary has been reviewed and is understood.  Full responsibility of the confidentiality of this discharge information lies with you and/or your care-partner. 

## 2018-12-24 NOTE — Op Note (Signed)
[facility] Patient Name: [name] Procedure Date: 12/24/2018 1:19 PM MRN: [id number] Endoscopist: Remo Lipps P. Havery Moros , MD Age: 72 Referring MD:  Date of Birth: 1946-07-21 Gender: Female Account #: [id number] Procedure:                Colonoscopy Indications:              Rectal bleeding, concern for possible rectovaginal                            fistula in the past with negative imaging and flex                            sig, altered bowel habits (constipation) Medicines:                Monitored Anesthesia Care Procedure:                Pre-Anesthesia Assessment:                           - Prior to the procedure, a History and Physical                            was performed, and patient medications and                            allergies were reviewed. The patient's tolerance of                            previous anesthesia was also reviewed. The risks                            and benefits of the procedure and the sedation                            options and risks were discussed with the patient.                            All questions were answered, and informed consent                            was obtained. Prior Anticoagulants: The patient has                            taken no previous anticoagulant or antiplatelet                            agents. ASA Grade Assessment: III - A patient with                            severe systemic disease. After reviewing the risks                            and benefits, the patient was deemed in  satisfactory condition to undergo the procedure.                           After obtaining informed consent, the colonoscope                            was passed under direct vision. Throughout the                            procedure, the patient's blood pressure, pulse, and                            oxygen saturations were monitored continuously. The                            Model  PCF-[id number]) scope was introduced                            through the anus and advanced to the the cecum,                            identified by appendiceal orifice and ileocecal                            valve. The colonoscopy was performed without                            difficulty. The patient tolerated the procedure                            well. The quality of the bowel preparation was                            adequate. The ileocecal valve, appendiceal orifice,                            and rectum were photographed. Scope In: 1:39:25 PM Scope Out: 2:09:19 PM Scope Withdrawal Time: 0 hours 19 minutes 39 seconds  Total Procedure Duration: 0 hours 29 minutes 54 seconds  Findings:                 The perianal and digital rectal examinations were                            normal.                           A 4 mm polyp was found in the ascending colon. The                            polyp was sessile. The polyp was removed with a                            cold snare. Resection and retrieval were complete.  A 4 mm polyp was found in the transverse colon. The                            polyp was sessile. The polyp was removed with a                            cold snare. Resection and retrieval were complete.                           A 4 mm polyp was found in the descending colon. The                            polyp was sessile. The polyp was removed with a                            cold snare. Resection and retrieval were complete.                           A 3 mm polyp was found in the sigmoid colon. The                            polyp was flat. The polyp was removed with a cold                            snare. Resection and retrieval were complete.                           Anal papilla(e) were hypertrophied. Biopsies were                            taken with a cold forceps for histology to ensure                            no  dysplasia / AIN.                           The colon was very tortuous, this prohibited                            intubation of the ileum.                           Internal hemorrhoids were found during retroflexion.                           The prep was generally adequate. Residual stool                            noted in the cecal cap. Most of this was able to be                            lavaged however due to residual seeds / nuts it  could not be completely cleared, but no obvious                            polyps noted in the cecum. The exam was otherwise                            without abnormality. Complications:            No immediate complications. Estimated blood loss:                            Minimal. Estimated Blood Loss:     Estimated blood loss was minimal. Impression:               - One 4 mm polyp in the ascending colon, removed                            with a cold snare. Resected and retrieved.                           - One 4 mm polyp in the transverse colon, removed                            with a cold snare. Resected and retrieved.                           - One 4 mm polyp in the descending colon, removed                            with a cold snare. Resected and retrieved.                           - One 3 mm polyp in the sigmoid colon, removed with                            a cold snare. Resected and retrieved.                           - Anal papilla(e) were hypertrophied. Biopsied.                           - Tortuous colon, which prohibited ileal intubation.                           - Internal hemorrhoids.                           - Mostly adequate prep, fair in the cecum.                           - The examination was otherwise normal. No evidence                            of IBD or fistula.  Suspect rectal bleeding is due to hemorrhoids in                            the setting of  constipation. Recommendation:           - Patient has a contact number available for                            emergencies. The signs and symptoms of potential                            delayed complications were discussed with the                            patient. Return to normal activities tomorrow.                            Written discharge instructions were provided to the                            patient.                           - Resume previous diet.                           - Continue present medications.                           - Continue Linzess for constipation                           - Await pathology results. Remo Lipps P. Jossalin Chervenak, MD 12/24/2018 2:17:45 PM This report has been signed electronically.

## 2018-12-24 NOTE — Progress Notes (Signed)
Called to room to assist during endoscopic procedure.  Patient ID and intended procedure confirmed with present staff. Received instructions for my participation in the procedure from the performing physician.  

## 2018-12-24 NOTE — Progress Notes (Signed)
Spontaneous respirations throughout. VSS. Resting comfortably. To PACU on room air. Report to  RN. 

## 2018-12-24 NOTE — Op Note (Signed)
[facility] Patient Name: [name] Procedure Date: 12/24/2018 1:20 PM MRN: [id number] Endoscopist: Remo Lipps P. Havery Moros , MD Age: 72 Referring MD:  Date of Birth: Sep 08, 1946 Gender: Female Account #: [id number] Procedure:                Upper GI endoscopy Indications:              Dysphagia - good response to empiric dilation last                            year, symptoms recently recurred Medicines:                Monitored Anesthesia Care Procedure:                Pre-Anesthesia Assessment:                           - Prior to the procedure, a History and Physical                            was performed, and patient medications and                            allergies were reviewed. The patient's tolerance of                            previous anesthesia was also reviewed. The risks                            and benefits of the procedure and the sedation                            options and risks were discussed with the patient.                            All questions were answered, and informed consent                            was obtained. Prior Anticoagulants: The patient has                            taken no previous anticoagulant or antiplatelet                            agents. ASA Grade Assessment: III - A patient with                            severe systemic disease. After reviewing the risks                            and benefits, the patient was deemed in                            satisfactory condition to undergo the procedure.  After obtaining informed consent, the endoscope was                            passed under direct vision. Throughout the                            procedure, the patient's blood pressure, pulse, and                            oxygen saturations were monitored continuously. The                            Endoscope was introduced through the mouth, and                            advanced to the  second part of duodenum. The upper                            GI endoscopy was accomplished without difficulty.                            The patient tolerated the procedure well. Scope In: Scope Out: Findings:                 Esophagogastric landmarks were identified: the                            Z-line was found at 38 cm, the gastroesophageal                            junction was found at 38 cm and the upper extent of                            the gastric folds was found at 40 cm from the                            incisors.                           The exam of the esophagus was otherwise normal.                           A guidewire was placed and the scope was withdrawn.                            Dilation was performed in the entire esophagus with                            a Savary dilator with mild resistance at 17 mm.                            Relook endoscopy showed a small mucosal wrent at  the UES                           Patchy erythematous mucosa was found in the gastric                            body.                           The exam of the stomach was otherwise normal.                           Biopsies were taken with a cold forceps in the                            gastric body, at the incisura and in the gastric                            antrum for Helicobacter pylori testing.                           The duodenal bulb and second portion of the                            duodenum were normal. Complications:            No immediate complications. Estimated blood loss:                            Minimal. Estimated Blood Loss:     Estimated blood loss was minimal. Impression:               - Esophagogastric landmarks identified.                           - Normal esophagus - empiric dilation performed to                            8mm with small mucosal wrent at the UES                           - Erythematous mucosa in the gastric  body.                           - Normal duodenal bulb and second portion of the                            duodenum.                           - Biopsies were taken with a cold forceps for                            Helicobacter pylori testing. Recommendation:           - Patient has a contact number available for  emergencies. The signs and symptoms of potential                            delayed complications were discussed with the                            patient. Return to normal activities tomorrow.                            Written discharge instructions were provided to the                            patient.                           - Post dilation diet                           - Continue present medications.                           - Await pathology results. Remo Lipps P. , MD 12/24/2018 2:24:01 PM This report has been signed electronically.

## 2018-12-26 ENCOUNTER — Telehealth: Payer: Self-pay

## 2018-12-26 NOTE — Telephone Encounter (Signed)
  Follow up Call-  Call back number 12/24/2018 08/13/2017 07/19/2017  Post procedure Call Back phone  # [phone]  Permission to leave phone message Yes Yes Yes  Some recent data might be hidden     Left message

## 2018-12-26 NOTE — Telephone Encounter (Signed)
Am willing to accept just make sure she knows there are no immediate appointments available

## 2018-12-26 NOTE — Telephone Encounter (Signed)
  Follow up Call-  Call back number 12/24/2018 08/13/2017 07/19/2017  Post procedure Call Back phone  # [phone]  Permission to leave phone message Yes Yes Yes  Some recent data might be hidden     Left Message

## 2018-12-26 NOTE — Telephone Encounter (Signed)
appt scheduled for 02/25/19

## 2018-12-26 NOTE — Telephone Encounter (Signed)
Am willing to accept her but warn her I have no openings til at least February

## 2018-12-30 ENCOUNTER — Other Ambulatory Visit: Payer: Self-pay | Admitting: Oncology

## 2018-12-30 ENCOUNTER — Ambulatory Visit
Admission: RE | Admit: 2018-12-30 | Discharge: 2018-12-30 | Disposition: A | Discharge status: Home / Self Care | Payer: Medicare Other | Source: Ambulatory Visit | Attending: Oncology | Admitting: Oncology

## 2018-12-30 ENCOUNTER — Encounter: Payer: Self-pay | Admitting: Oncology

## 2018-12-30 DIAGNOSIS — Z9889 Other specified postprocedural states: Secondary | ICD-10-CM

## 2018-12-30 DIAGNOSIS — N644 Mastodynia: Secondary | ICD-10-CM | POA: Diagnosis not present

## 2018-12-30 DIAGNOSIS — R922 Inconclusive mammogram: Secondary | ICD-10-CM | POA: Diagnosis not present

## 2019-01-03 ENCOUNTER — Other Ambulatory Visit: Payer: Self-pay | Admitting: Physician Assistant

## 2019-01-03 DIAGNOSIS — I10 Essential (primary) hypertension: Secondary | ICD-10-CM

## 2019-01-03 NOTE — Telephone Encounter (Signed)
This is Dr. Crenshaw's pt 

## 2019-01-03 NOTE — Telephone Encounter (Signed)
Rx request sent to pharmacy.  

## 2019-01-07 ENCOUNTER — Encounter: Payer: Self-pay | Admitting: Gynecology

## 2019-01-09 DIAGNOSIS — M25562 Pain in left knee: Secondary | ICD-10-CM | POA: Diagnosis not present

## 2019-01-09 DIAGNOSIS — M25561 Pain in right knee: Secondary | ICD-10-CM | POA: Diagnosis not present

## 2019-01-10 ENCOUNTER — Telehealth: Payer: Self-pay | Admitting: Cardiology

## 2019-01-10 NOTE — Telephone Encounter (Signed)
Pt called needing samples will run out of repatha next week I told pt to come to the office on Tuesday and get sample. Pt also gave new insurance info and I will get pa done on [email or web address]

## 2019-01-10 NOTE — Telephone Encounter (Signed)
New Message:    Patient would like for some one to call her back.

## 2019-01-13 ENCOUNTER — Telehealth: Payer: Self-pay

## 2019-01-13 NOTE — Telephone Encounter (Signed)
Called pt to let them know that one sample of repatha is available at the [facility] office only one pen. Instructed pt that in order to get the medication the [facility] office will need a copy of current insurance card. All this was left onpt voicemail

## 2019-01-13 NOTE — Telephone Encounter (Signed)
Will provide Repatha 140mg  sample until PA renewal completed.   Samples of this drug were given to the patient, quantity #1 Lot Number [id number]; exp 04/22

## 2019-01-17 DIAGNOSIS — N200 Calculus of kidney: Secondary | ICD-10-CM | POA: Diagnosis not present

## 2019-01-17 DIAGNOSIS — R310 Gross hematuria: Secondary | ICD-10-CM | POA: Diagnosis not present

## 2019-01-20 ENCOUNTER — Other Ambulatory Visit: Payer: Self-pay

## 2019-01-20 MED ORDER — EVOLOCUMAB 140 MG/ML ~~LOC~~ SOAJ: 140.0000 mg | SUBCUTANEOUS | 12 refills | Status: DC

## 2019-01-20 NOTE — Telephone Encounter (Signed)
alled and let pt know that the refill was sent to [facility]

## 2019-01-22 ENCOUNTER — Telehealth: Payer: Self-pay | Admitting: Gastroenterology

## 2019-01-22 MED ORDER — LINACLOTIDE 145 MCG PO CAPS: 145.0000 ug | ORAL_CAPSULE | Freq: Every day | ORAL | 5 refills | Status: DC

## 2019-01-22 NOTE — Telephone Encounter (Signed)
Sent Linzess script to [facility]

## 2019-01-22 NOTE — Telephone Encounter (Signed)
Pt informed that Linzess samples worked well for her and she requested a prescription refill sent to [facility].

## 2019-01-25 DIAGNOSIS — M810 Age-related osteoporosis without current pathological fracture: Secondary | ICD-10-CM

## 2019-01-25 HISTORY — DX: Age-related osteoporosis without current pathological fracture: M81.0

## 2019-01-27 DIAGNOSIS — N200 Calculus of kidney: Secondary | ICD-10-CM | POA: Diagnosis not present

## 2019-01-28 ENCOUNTER — Telehealth: Payer: Self-pay | Admitting: Cardiology

## 2019-01-28 NOTE — Telephone Encounter (Signed)
New message    Pt stated that she got an letter from [name] saying repatha was not approved and wants to know what to do

## 2019-01-28 NOTE — Telephone Encounter (Signed)
Called pt to let them know that I have the copy of an approval on file for repatha thru 12/20/19

## 2019-01-29 ENCOUNTER — Ambulatory Visit (INDEPENDENT_AMBULATORY_CARE_PROVIDER_SITE_OTHER): Payer: Medicare Other

## 2019-01-29 DIAGNOSIS — Z780 Asymptomatic menopausal state: Secondary | ICD-10-CM

## 2019-01-29 DIAGNOSIS — M810 Age-related osteoporosis without current pathological fracture: Secondary | ICD-10-CM

## 2019-01-29 DIAGNOSIS — M8589 Other specified disorders of bone density and structure, multiple sites: Secondary | ICD-10-CM | POA: Diagnosis not present

## 2019-01-29 DIAGNOSIS — J342 Deviated nasal septum: Secondary | ICD-10-CM | POA: Diagnosis not present

## 2019-01-29 DIAGNOSIS — J310 Chronic rhinitis: Secondary | ICD-10-CM | POA: Diagnosis not present

## 2019-01-29 DIAGNOSIS — J343 Hypertrophy of nasal turbinates: Secondary | ICD-10-CM | POA: Diagnosis not present

## 2019-01-29 DIAGNOSIS — J324 Chronic pansinusitis: Secondary | ICD-10-CM | POA: Diagnosis not present

## 2019-01-30 ENCOUNTER — Encounter: Payer: Self-pay | Admitting: Gynecology

## 2019-01-30 ENCOUNTER — Other Ambulatory Visit: Payer: Self-pay | Admitting: Gynecology

## 2019-01-30 DIAGNOSIS — M8589 Other specified disorders of bone density and structure, multiple sites: Secondary | ICD-10-CM

## 2019-01-30 DIAGNOSIS — Z780 Asymptomatic menopausal state: Secondary | ICD-10-CM

## 2019-01-31 DIAGNOSIS — M238X1 Other internal derangements of right knee: Secondary | ICD-10-CM | POA: Diagnosis not present

## 2019-01-31 DIAGNOSIS — M25561 Pain in right knee: Secondary | ICD-10-CM | POA: Diagnosis not present

## 2019-02-03 ENCOUNTER — Other Ambulatory Visit: Payer: Self-pay | Admitting: Urology

## 2019-02-05 NOTE — H&P (Signed)
HPI: Ms. Corker is a 73 year old female with a long history of kidney stones, IC with urgency/frequency, a possible colovaginal fistula and atrophic vaginitis treated with Myrbetriq and Elmiron. She is currently on topical vaginal estrogen cream twice a week for dyspareunia and atrophic vaginitis.   She reports gas bubbles from her vagina--being followed by Dr. Dema Severin for a possible colovaginal fistula repair.   She denies recent stone passage, flank pain, dysuria, hematuria or interval UTIs. UA clear today.   CTSS 05/10/2018  IMPRESSION:  1. No acute abnormality. No urolithiasis. Previously described 2 mm  right pelvic ureteral and 4 mm left bladder stones are absent on  today's scan. No hydronephrosis.  2. Chronic findings include: Aortic Atherosclerosis (ICD10-I70.0).  Tiny hiatal hernia. Stable liver hemangiomas.     01/17/19: Ms. Shepheard has above noted history. She presents today with several complaint of gross hematuria, which has been occurring intermittently for the past several weeks. However, she has noted progression of this over the past few days. She is having hematuria today. She denies clots of difficulties voiding. She also complains of dysuria and increased urgency and frequency. She complains of bilateral flank and back pain, as well. She feels that the pain is R > L. She does have some associated nausea today. She denies fevers or chills. She originally thought noted blood may be rectal, but she had complete GI evaluation on 12/31, which was normal. She has had a hysterectomy. She does use Tramadol every 12 hours for hx of fibromyalgia. She has been using some Uribel, which does help.   01/27/19: Ms. Beeck returns today for follow up. CT imaging at prior OV revealed an approximatly 4 X 6 mm calculus (approximatly 653 HU with skin to stone distance 10 cm) noted at the left renal pelvis. There was not a significant amount of hydronephrosis noted at the time. Today she denies seeing a  stone pass in the interim. She continues to complain of ongoing flank and pelvic pain. The flank pain is now occurring primarily on the left. The pelvic pain will be bilaterally. She will have episodes of what she describes as a sharp, stabbing pain that will radiate to her LLQ. Pain has been well managed with Tramadol, which she uses daily for hx of Fibromyalgia. She denies interval episodes of hematuria. She does have some increased dysuria today, but denies exacerbation of voiding symptoms. She continues to have some nausea at times, but no vomiting. No fevers or chills.   CT 01/17/19:   IMPRESSION:  New 4 x 5 x 7 mm stone in the left renal pelvis without appreciable  hydronephrosis. No other urinary stone disease evident on today's  exam.   Aortic Atherosclerosis     ALLERGIES: Clindamycin HCl CAPS - Hives Codeine Derivatives - Itching Influenza (Split PF) - Itching, Hives Latex - Skin Rash Penicillins - Skin Rash, Trouble Breathing pentazocine - Other Reaction, hallucination Pneumococcal Vaccines - Swelling, Hives, Skin Rash PredniSONE (Pak) TABS - Skin Rash, Hives, Trouble Breathing tamoxifen - Headache, Nausea    MEDICATIONS: Tamsulosin Hcl 0.4 mg capsule 1 capsule PO Daily  Atenolol 50 mg tablet Oral  Calcium Citrate- Vitamin D 315 mg-250 unit tablet Oral  Cymbalta 60 mg capsule,delayed release Oral  Fluticasone Propionate 50 mcg/actuation spray, suspension Nasal  Furosemide 20 mg tablet Oral  Gabapentin 300 mg capsule Oral  Hydrocodone-Acetaminophen 5 mg-325 mg tablet 1 tablet PO Q 6 H PRN  Linzess  Luvena Vaginal Moisturizer Vaginal Gel 0 Vaginal  Multiple Vitamins tablet Oral  Omeprazole 40 mg capsule,delayed release Oral  Stool Softener 240 mg capsule Oral  Tramadol Hcl 50 mg tablet Oral  Trazodone Hcl 100 mg tablet Oral  Uribel 118 MG Oral Capsule 1 capsule PO Q 8 H PRN  Zofran 4 mg tablet 1 tablet PO Q 8 H PRN     GU PSH: Cystoscopy - 06/22/2017,  10/04/2016 Cystoscopy Hydrodistention - 2015 Hysterectomy Unilat SO - 2015 Locm 300-399Mg /Ml Iodine,1Ml - 04/25/2018    NON-GU PSH: Bmi<30 And >=22 Calc & Docu - 10/11/2016 Doc Meds Verified W/Pt Or Re - 10/11/2016 Foot surgery (unspecified) Hand/finger Surgery Nose Surgery (Unspecified) Other Pt/Ot Current Status - 10/11/2016 Other Pt/Ot Goal Status - 10/11/2016 Pain Doc Pos And Plan - 10/11/2016 Remove Adenoids - 2015 Remove Breast Lesion - 2015 Remove Tonsils - 2015    GU PMH: Flank Pain - 01/17/2019 Renal calculus - 01/17/2019 Ureteral calculus, Left, 4 mm left UVJ - 05/09/2018 Dorsalgia, Unspec (Worsening, Chronic), F/U with Dr. Rolena Infante for back pain - 04/22/2018 Hydronephrosis Unspec (Acute), Left, Will proceed with CT abd/pelvis w/wo contrast. BUN/creatnine today. - 04/22/2018 Incomplete bladder emptying (Stable, Chronic), PVR: 0 ml. Will continue with PRN Uribel. Will check kidney function today. 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 Send urine for cytology Will have pt RTC for cysto, - 08/29/2016 Suprapubic pain - 2017 Dysuria, Dysuria - 2015 Urinary Tract Inf, Unspec site, Urinary tract infection - 2015 Other microscopic hematuria, Microhematuria - 2015      PMH Notes: Reports pelvic fracture and points to the left pubic ramus On the pelvic model in July 2017 and has used a cane ever since.   NON-GU PMH: Constipation, unspecified - 11/27/2016, - 10/24/2016 Muscle weakness (generalized) - 11/27/2016, - 10/24/2016 Other muscle spasm - 11/27/2016, - 10/24/2016, Levator spasm, - 2015 Other lack of coordination -  10/24/2016 Unspecified dyspareunia, Dyspareunia - 2015 Encounter for general adult medical examination without abnormal findings, Encounter for preventive health examination - 2015 Anxiety, Anxiety - 2015 Breast Cancer, History, History of breast cancer - 2015 Obstructive sleep apnea (adult) (pediatric), Obstructive sleep apnea, adult - 2015 Personal history of other diseases of the circulatory system, History of cardiac arrhythmia - 2015, History of cardiac murmur, - 2015, History of hypertension, - 2015 Personal history of other diseases of the digestive system, History of gastroesophageal reflux (GERD) - 2015 Personal history of other diseases of the musculoskeletal system and connective tissue, History of arthritis - 2015 Personal history of other endocrine, nutritional and metabolic disease, History of hypercholesterolemia - 2015 Personal history of other mental and behavioral disorders, History of depression - 2015    FAMILY HISTORY: cardiac disorder - Runs In Family Heart Attack - Son Hematuria - Runs In Family Kidney Stones - Runs In Family pancreatic cancer - Runs In Family Strokes - Runs In Family   SOCIAL HISTORY: Marital Status: Married Preferred Language: English; Ethnicity: Not Hispanic Or Latino; Race: White Current Smoking Status: Patient does not smoke anymore.  Does not use smokeless tobacco. Had 1 drink per year. Does not use drugs. Does not drink caffeine. Has not had a blood transfusion.    REVIEW OF SYSTEMS:    GU Review Female:   Patient  reports hard to postpone urination, burning /pain with urination, leakage of urine, trouble starting your stream, and have to strain to urinate. Patient denies frequent urination, get up at night to urinate, stream starts and stops, and being pregnant.  Gastrointestinal (Upper):   Patient reports indigestion/ heartburn. Patient denies nausea and vomiting.  Gastrointestinal (Lower):   Patient denies diarrhea and constipation.   Constitutional:   Patient reports night sweats and fatigue. Patient denies fever and weight loss.  Skin:   Patient denies skin rash/ lesion and itching.  Eyes:   Patient denies blurred vision and double vision.  Ears/ Nose/ Throat:   Patient reports sore throat and sinus problems.   Hematologic/Lymphatic:   Patient denies easy bruising.  Cardiovascular:   Patient reports leg swelling. Patient denies chest pains.  Respiratory:   Patient denies cough and shortness of breath.  Endocrine:   Patient reports excessive thirst.   Musculoskeletal:   Patient reports back pain. Patient denies joint pain.  Neurological:   Patient reports headaches. Patient denies dizziness.  Psychologic:   Patient denies depression and anxiety.   VITAL SIGNS:   Weight 129 lb / 58.51 kg  Height 64.5 in / 163.83 cm  BP 152/89 mmHg  Pulse 80 /min  Temperature 97.8 F / 36.5 C  BMI 21.8 kg/m   MULTI-SYSTEM PHYSICAL EXAMINATION:    Constitutional: Well-nourished. No physical deformities. Normally developed. Good grooming.  Respiratory: Normal breath sounds. No labored breathing, no use of accessory muscles.   Cardiovascular: Regular rate and rhythm. No murmur, no gallop. Normal temperature, normal extremity pulses, no swelling, no varicosities.   Neurologic / Psychiatric: Oriented to time, oriented to place, oriented to person. No depression, no anxiety, no agitation.  Gastrointestinal: No mass, no tenderness, no rigidity, non obese abdomen. Mild LLQ pain, no CVAT.   Musculoskeletal: Normal gait and station of head and neck.     PAST DATA REVIEWED:  Source Of History:  Patient  Records Review:   Previous Patient Records  Urine Test Review:   Urinalysis, Urine Culture  X-Ray Review: C.T. Abdomen/Pelvis: Reviewed Films. Reviewed Report.     PROCEDURES:         [facility] - [id number]  A single view of the abdomen is obtained. No obvious opacities noted within the confines of the left renal shadow or along the expected  anatomical course of the right ureter. On left, there continues to be an approximatly 3 X 7 mm opacity noted in the vicinity of the left UPJ/ proximal ureter. This appears consistent with stone noted on CT imaging and does not appear to have progressed. Scoliosis noted. Normal appearing bowel gas pattern.                Urinalysis w/Scope Dipstick Dipstick Cont'd Micro  Color: Yellow Bilirubin: Neg mg/dL WBC/hpf: 6 - 10/hpf  Appearance: Clear Ketones: Trace mg/dL RBC/hpf: 0 - 2/hpf  Specific Gravity: 1.025 Blood: Neg ery/uL Bacteria: NS (Not Seen)  pH: 6.0 Protein: 1+ mg/dL Cystals: Ca Oxalate  Glucose: Neg mg/dL Urobilinogen: 1.0 mg/dL Casts: Hyaline    Nitrites: Neg Trichomonas: Not Present    Leukocyte Esterase: 2+ leu/uL Mucous: Not Present      Epithelial Cells: NS (Not Seen)      Yeast: NS (Not Seen)      Sperm: Not Present    ASSESSMENT:      ICD-10 Details  1 GU:   Renal calculus - N20.0   2   Flank Pain - R10.84  PLAN:           Orders Labs Urine Culture          Document Letter(s):  Created for Patient: Clinical Summary         Notes: On K[facility] imaging study today, there continues to be an approximatly 3 X 7 mm opacity noted in the vicinity of the left UPJ/ proximal ureter. This appears consistent with stone noted on CT imaging and does not appear to have progressed. We reviewed treatment options in detail today including: continued observation, ESWL, or ureteroscopy.   For ureteroscopy I described the risks  which include general anesthetic complications, bleeding,  infection, damage to contiguous structures, positioning injury, ureteral  stricture, ureteral avulsion, ureteral injury, need for ureteral stent,  inability to perform ureteroscopy, need for an interval procedure, inability to  clear stone burden, stent discomfort and pain.   -For shockwave lithotripsy  I described the risks which include arrhythmia, kidney contusion, kidney  hemorrhage, need for  transfusion, back discomfort, flank ecchymosis, flank abrasion, inability to break up stone, inability to pass stone fragments, Steinstrasse, infection associated with obstructing stones, need for different surgical procedure and possible need for repeat shockwave lithotripsy.   Given that calculus has shown minimal progression, she is interested in considering intervention and is seems most interested with ESWL.

## 2019-02-05 NOTE — Discharge Instructions (Signed)
Lithotripsy, Care After °This sheet gives you information about how to care for yourself after your procedure. Your health care provider may also give you more specific instructions. If you have problems or questions, contact your health care provider. °What can I expect after the procedure? °After the procedure, it is common to have: °· Some blood in your urine. This should only last for a few days. °· Soreness in your back, sides, or upper abdomen for a few days. °· Blotches or bruises on your back where the pressure wave entered the skin. °· Pain, discomfort, or nausea when pieces (fragments) of the kidney stone move through the tube that carries urine from the kidney to the bladder (ureter). Stone fragments may pass soon after the procedure, but they may continue to pass for up to 4-8 weeks. °? If you have severe pain or nausea, contact your health care provider. This may be caused by a large stone that was not broken up, and this may mean that you need more treatment. °· Some pain or discomfort during urination. °· Some pain or discomfort in the lower abdomen or (in men) at the base of the penis. °Follow these instructions at home: °Medicines °· Take over-the-counter and prescription medicines only as told by your health care provider. °· If you were prescribed an antibiotic medicine, take it as told by your health care provider. Do not stop taking the antibiotic even if you start to feel better. °· Do not drive for 24 hours if you were given a medicine to help you relax (sedative). °· Do not drive or use heavy machinery while taking prescription pain medicine. °Eating and drinking ° °  ° °· Drink enough water and fluids to keep your urine clear or pale yellow. This helps any remaining pieces of the stone to pass. It can also help prevent new stones from forming. °· Eat plenty of fresh fruits and vegetables. °· Follow instructions from your health care provider about eating and drinking restrictions. You may be  instructed: °? To reduce how much salt (sodium) you eat or drink. Check ingredients and nutrition facts on packaged foods and beverages. °? To reduce how much meat you eat. °· Eat the recommended amount of calcium for your age and gender. Ask your health care provider how much calcium you should have. °General instructions °· Get plenty of rest. °· Most people can resume normal activities 1-2 days after the procedure. Ask your health care provider what activities are safe for you. °· Your health care provider may direct you to lie in a certain position (postural drainage) and tap firmly (percuss) over your kidney area to help stone fragments pass. Follow instructions as told by your health care provider. °· If directed, strain all urine through the strainer that was provided by your health care provider. °? Keep all fragments for your health care provider to see. Any stones that are found may be sent to a medical lab for examination. The stone may be as small as a grain of salt. °· Keep all follow-up visits as told by your health care provider. This is important. °Contact a health care provider if: °· You have pain that is severe or does not get better with medicine. °· You have nausea that is severe or does not go away. °· You have blood in your urine longer than your health care provider told you to expect. °· You have more blood in your urine. °· You have pain during urination that does   not go away. °· You urinate more frequently than usual and this does not go away. °· You develop a rash or any other possible signs of an allergic reaction. °Get help right away if: °· You have severe pain in your back, sides, or upper abdomen. °· You have severe pain while urinating. °· Your urine is very dark red. °· You have blood in your stool (feces). °· You cannot pass any urine at all. °· You feel a strong urge to urinate after emptying your bladder. °· You have a fever or chills. °· You develop shortness of breath,  difficulty breathing, or chest pain. °· You have severe nausea that leads to persistent vomiting. °· You faint. °Summary °· After this procedure, it is common to have some pain, discomfort, or nausea when pieces (fragments) of the kidney stone move through the tube that carries urine from the kidney to the bladder (ureter). If this pain or nausea is severe, however, you should contact your health care provider. °· Most people can resume normal activities 1-2 days after the procedure. Ask your health care provider what activities are safe for you. °· Drink enough water and fluids to keep your urine clear or pale yellow. This helps any remaining pieces of the stone to pass, and it can help prevent new stones from forming. °· If directed, strain your urine and keep all fragments for your health care provider to see. Fragments or stones may be as small as a grain of salt. °· Get help right away if you have severe pain in your back, sides, or upper abdomen or have severe pain while urinating. °This information is not intended to replace advice given to you by your health care provider. Make sure you discuss any questions you have with your health care provider. °Document Released: 12/31/2007 Document Revised: 05/22/2018 Document Reviewed: 11/01/2016 °Elsevier Interactive Patient Education © 2019 [employer] ° °

## 2019-02-06 ENCOUNTER — Encounter (HOSPITAL_COMMUNITY): Payer: Self-pay | Admitting: General Practice

## 2019-02-06 ENCOUNTER — Encounter (HOSPITAL_COMMUNITY)
Admission: RE | Disposition: A | Discharge status: Home / Self Care | Payer: Self-pay | Source: Other Acute Inpatient Hospital | Attending: Urology

## 2019-02-06 ENCOUNTER — Ambulatory Visit (HOSPITAL_COMMUNITY)
Admission: RE | Admit: 2019-02-06 | Discharge: 2019-02-06 | Disposition: A | Discharge status: Home / Self Care | Payer: Medicare Other | Source: Other Acute Inpatient Hospital | Attending: Urology | Admitting: Urology

## 2019-02-06 ENCOUNTER — Ambulatory Visit (HOSPITAL_COMMUNITY): Payer: Medicare Other

## 2019-02-06 DIAGNOSIS — Z87891 Personal history of nicotine dependence: Secondary | ICD-10-CM | POA: Insufficient documentation

## 2019-02-06 DIAGNOSIS — M797 Fibromyalgia: Secondary | ICD-10-CM | POA: Diagnosis not present

## 2019-02-06 DIAGNOSIS — K219 Gastro-esophageal reflux disease without esophagitis: Secondary | ICD-10-CM | POA: Insufficient documentation

## 2019-02-06 DIAGNOSIS — N200 Calculus of kidney: Secondary | ICD-10-CM | POA: Diagnosis not present

## 2019-02-06 DIAGNOSIS — G4733 Obstructive sleep apnea (adult) (pediatric): Secondary | ICD-10-CM | POA: Insufficient documentation

## 2019-02-06 DIAGNOSIS — N952 Postmenopausal atrophic vaginitis: Secondary | ICD-10-CM | POA: Insufficient documentation

## 2019-02-06 DIAGNOSIS — F329 Major depressive disorder, single episode, unspecified: Secondary | ICD-10-CM | POA: Diagnosis not present

## 2019-02-06 DIAGNOSIS — F419 Anxiety disorder, unspecified: Secondary | ICD-10-CM | POA: Diagnosis not present

## 2019-02-06 DIAGNOSIS — Z853 Personal history of malignant neoplasm of breast: Secondary | ICD-10-CM | POA: Diagnosis not present

## 2019-02-06 DIAGNOSIS — K5900 Constipation, unspecified: Secondary | ICD-10-CM | POA: Insufficient documentation

## 2019-02-06 DIAGNOSIS — Z7951 Long term (current) use of inhaled steroids: Secondary | ICD-10-CM | POA: Diagnosis not present

## 2019-02-06 DIAGNOSIS — N201 Calculus of ureter: Secondary | ICD-10-CM | POA: Diagnosis not present

## 2019-02-06 DIAGNOSIS — Z79899 Other long term (current) drug therapy: Secondary | ICD-10-CM | POA: Diagnosis not present

## 2019-02-06 DIAGNOSIS — I10 Essential (primary) hypertension: Secondary | ICD-10-CM | POA: Insufficient documentation

## 2019-02-06 HISTORY — PX: EXTRACORPOREAL SHOCK WAVE LITHOTRIPSY: SHX1557

## 2019-02-06 SURGERY — LITHOTRIPSY, ESWL
Anesthesia: LOCAL | Laterality: Left

## 2019-02-06 MED ORDER — SODIUM CHLORIDE 0.9 % IV SOLN
INTRAVENOUS | Status: DC
  Administered 2019-02-06: 07:00:00 via INTRAVENOUS

## 2019-02-06 MED ORDER — DIPHENHYDRAMINE HCL 25 MG PO CAPS
25.0000 mg | ORAL_CAPSULE | ORAL | Status: AC
  Administered 2019-02-06: 25 mg via ORAL
  Filled 2019-02-06: qty 1

## 2019-02-06 MED ORDER — DIAZEPAM 5 MG PO TABS
10.0000 mg | ORAL_TABLET | ORAL | Status: AC
  Administered 2019-02-06: 10 mg via ORAL
  Filled 2019-02-06: qty 2

## 2019-02-06 MED ORDER — TAMSULOSIN HCL 0.4 MG PO CAPS: 0.4000 mg | ORAL_CAPSULE | ORAL | 0 refills | Status: DC

## 2019-02-06 MED ORDER — HYDROCODONE-ACETAMINOPHEN 10-325 MG PO TABS: 1.0000 | ORAL_TABLET | ORAL | 0 refills | Status: DC | PRN

## 2019-02-06 MED ORDER — CIPROFLOXACIN HCL 500 MG PO TABS
500.0000 mg | ORAL_TABLET | ORAL | Status: AC
  Administered 2019-02-06: 500 mg via ORAL
  Filled 2019-02-06: qty 1

## 2019-02-06 NOTE — Op Note (Signed)
See [facility] OP note scanned into chart. Also because of the size, density, location and other factors that cannot be anticipated I feel this will likely be a staged procedure. This fact supersedes any indication in the scanned [facility] operative note to the contrary.  

## 2019-02-07 ENCOUNTER — Encounter (HOSPITAL_COMMUNITY): Payer: Self-pay | Admitting: Urology

## 2019-02-10 DIAGNOSIS — M25561 Pain in right knee: Secondary | ICD-10-CM | POA: Diagnosis not present

## 2019-02-14 ENCOUNTER — Other Ambulatory Visit: Payer: Self-pay | Admitting: Physician Assistant

## 2019-02-14 NOTE — Telephone Encounter (Signed)
This is Dr. Crenshaw's pt 

## 2019-02-17 DIAGNOSIS — M76891 Other specified enthesopathies of right lower limb, excluding foot: Secondary | ICD-10-CM | POA: Diagnosis not present

## 2019-02-17 DIAGNOSIS — M25561 Pain in right knee: Secondary | ICD-10-CM | POA: Diagnosis not present

## 2019-02-25 ENCOUNTER — Ambulatory Visit (INDEPENDENT_AMBULATORY_CARE_PROVIDER_SITE_OTHER): Payer: Medicare Other | Admitting: Family Medicine

## 2019-02-25 ENCOUNTER — Encounter

## 2019-02-25 ENCOUNTER — Encounter: Payer: Self-pay | Admitting: Family Medicine

## 2019-02-25 VITALS — BP 116/72 | HR 71 | Temp 97.9°F | Resp 18 | Ht 64.5 in | Wt 133.8 lb

## 2019-02-25 DIAGNOSIS — R350 Frequency of micturition: Secondary | ICD-10-CM | POA: Diagnosis not present

## 2019-02-25 DIAGNOSIS — N289 Disorder of kidney and ureter, unspecified: Secondary | ICD-10-CM

## 2019-02-25 DIAGNOSIS — J343 Hypertrophy of nasal turbinates: Secondary | ICD-10-CM

## 2019-02-25 DIAGNOSIS — J329 Chronic sinusitis, unspecified: Secondary | ICD-10-CM | POA: Diagnosis not present

## 2019-02-25 DIAGNOSIS — N200 Calculus of kidney: Secondary | ICD-10-CM

## 2019-02-25 DIAGNOSIS — Z85828 Personal history of other malignant neoplasm of skin: Secondary | ICD-10-CM | POA: Diagnosis not present

## 2019-02-25 DIAGNOSIS — Z8489 Family history of other specified conditions: Secondary | ICD-10-CM

## 2019-02-25 DIAGNOSIS — Z860100 Personal history of colon polyps, unspecified: Secondary | ICD-10-CM

## 2019-02-25 DIAGNOSIS — M810 Age-related osteoporosis without current pathological fracture: Secondary | ICD-10-CM

## 2019-02-25 DIAGNOSIS — I10 Essential (primary) hypertension: Secondary | ICD-10-CM | POA: Diagnosis not present

## 2019-02-25 DIAGNOSIS — R0789 Other chest pain: Secondary | ICD-10-CM

## 2019-02-25 DIAGNOSIS — Z87891 Personal history of nicotine dependence: Secondary | ICD-10-CM

## 2019-02-25 DIAGNOSIS — K644 Residual hemorrhoidal skin tags: Secondary | ICD-10-CM

## 2019-02-25 DIAGNOSIS — E785 Hyperlipidemia, unspecified: Secondary | ICD-10-CM

## 2019-02-25 DIAGNOSIS — K222 Esophageal obstruction: Secondary | ICD-10-CM

## 2019-02-25 HISTORY — DX: Personal history of colonic polyps: Z86.010

## 2019-02-25 HISTORY — DX: Personal history of colon polyps, unspecified: Z86.0100

## 2019-02-25 HISTORY — DX: Chronic sinusitis, unspecified: J32.9

## 2019-02-25 LAB — URINALYSIS
Bilirubin Urine: NEGATIVE
HGB URINE DIPSTICK: NEGATIVE
Leukocytes,Ua: NEGATIVE
Nitrite: NEGATIVE
Specific Gravity, Urine: 1.02 (ref 1.000–1.030)
UROBILINOGEN UA: 1 (ref 0.0–1.0)
Urine Glucose: NEGATIVE
pH: 6.5 (ref 5.0–8.0)

## 2019-02-25 NOTE — Assessment & Plan Note (Signed)
Well controlled, no changes to meds. Encouraged heart healthy diet such as the DASH diet and exercise as tolerated.  °

## 2019-02-25 NOTE — Assessment & Plan Note (Signed)
Monitor and hydrate 

## 2019-02-25 NOTE — Assessment & Plan Note (Signed)
Follows with gastroenterology, follows with Dr Havery Moros. Was stretched with an endoscopy in September

## 2019-02-25 NOTE — Assessment & Plan Note (Signed)
Follows with ENT, Dr Claiborne Rigg has had 5 abx in 6 months and has a CT scan ordered for a few weeks from now.

## 2019-02-25 NOTE — Assessment & Plan Note (Signed)
Father unknown anesthesia med

## 2019-02-25 NOTE — Patient Instructions (Addendum)
Need appt with dermatology soon   change the blood pressure meds to at night and then   shingrix is the new shingles shot 2 shots over 2-6 months at the pharmacy Hypertension Hypertension, commonly called high blood pressure, is when the force of blood pumping through the arteries is too strong. The arteries are the blood vessels that carry blood from the heart throughout the body. Hypertension forces the heart to work harder to pump blood and may cause arteries to become narrow or stiff. Having untreated or uncontrolled hypertension can cause heart attacks, strokes, kidney disease, and other problems. A blood pressure reading consists of a higher number over a lower number. Ideally, your blood pressure should be below 120/80. The first ("top") number is called the systolic pressure. It is a measure of the pressure in your arteries as your heart beats. The second ("bottom") number is called the diastolic pressure. It is a measure of the pressure in your arteries as the heart relaxes. What are the causes? The cause of this condition is not known. What increases the risk? Some risk factors for high blood pressure are under your control. Others are not. Factors you can change  Smoking.  Having type 2 diabetes mellitus, high cholesterol, or both.  Not getting enough exercise or physical activity.  Being overweight.  Having too much fat, sugar, calories, or salt (sodium) in your diet.  Drinking too much alcohol. Factors that are difficult or impossible to change  Having chronic kidney disease.  Having a family history of high blood pressure.  Age. Risk increases with age.  Race. You may be at higher risk if you are African-American.  Gender. Men are at higher risk than women before age 71. After age 29, women are at higher risk than men.  Having obstructive sleep apnea.  Stress. What are the signs or symptoms? Extremely high blood pressure (hypertensive crisis) may  cause:  Headache.  Anxiety.  Shortness of breath.  Nosebleed.  Nausea and vomiting.  Severe chest pain.  Jerky movements you cannot control (seizures). How is this diagnosed? This condition is diagnosed by measuring your blood pressure while you are seated, with your arm resting on a surface. The cuff of the blood pressure monitor will be placed directly against the skin of your upper arm at the level of your heart. It should be measured at least twice using the same arm. Certain conditions can cause a difference in blood pressure between your right and left arms. Certain factors can cause blood pressure readings to be lower or higher than normal (elevated) for a short period of time:  When your blood pressure is higher when you are in a health care provider's office than when you are at home, this is called white coat hypertension. Most people with this condition do not need medicines.  When your blood pressure is higher at home than when you are in a health care provider's office, this is called masked hypertension. Most people with this condition may need medicines to control blood pressure. If you have a high blood pressure reading during one visit or you have normal blood pressure with other risk factors:  You may be asked to return on a different day to have your blood pressure checked again.  You may be asked to monitor your blood pressure at home for 1 week or longer. If you are diagnosed with hypertension, you may have other blood or imaging tests to help your health care provider understand your overall  risk for other conditions. How is this treated? This condition is treated by making healthy lifestyle changes, such as eating healthy foods, exercising more, and reducing your alcohol intake. Your health care provider may prescribe medicine if lifestyle changes are not enough to get your blood pressure under control, and if:  Your systolic blood pressure is above 130.  Your  diastolic blood pressure is above 80. Your personal target blood pressure may vary depending on your medical conditions, your age, and other factors. Follow these instructions at home: Eating and drinking   Eat a diet that is high in fiber and potassium, and low in sodium, added sugar, and fat. An example eating plan is called the DASH (Dietary Approaches to Stop Hypertension) diet. To eat this way: ? Eat plenty of fresh fruits and vegetables. Try to fill half of your plate at each meal with fruits and vegetables. ? Eat whole grains, such as whole wheat pasta, brown rice, or whole grain bread. Fill about one quarter of your plate with whole grains. ? Eat or drink low-fat dairy products, such as skim milk or low-fat yogurt. ? Avoid fatty cuts of meat, processed or cured meats, and poultry with skin. Fill about one quarter of your plate with lean proteins, such as fish, chicken without skin, beans, eggs, and tofu. ? Avoid premade and processed foods. These tend to be higher in sodium, added sugar, and fat.  Reduce your daily sodium intake. Most people with hypertension should eat less than 1,500 mg of sodium a day.  Limit alcohol intake to no more than 1 drink a day for nonpregnant women and 2 drinks a day for men. One drink equals 12 oz of beer, 5 oz of wine, or 1 oz of hard liquor. Lifestyle   Work with your health care provider to maintain a healthy body weight or to lose weight. Ask what an ideal weight is for you.  Get at least 30 minutes of exercise that causes your heart to beat faster (aerobic exercise) most days of the week. Activities may include walking, swimming, or biking.  Include exercise to strengthen your muscles (resistance exercise), such as pilates or lifting weights, as part of your weekly exercise routine. Try to do these types of exercises for 30 minutes at least 3 days a week.  Do not use any products that contain nicotine or tobacco, such as cigarettes and  e-cigarettes. If you need help quitting, ask your health care provider.  Monitor your blood pressure at home as told by your health care provider.  Keep all follow-up visits as told by your health care provider. This is important. Medicines  Take over-the-counter and prescription medicines only as told by your health care provider. Follow directions carefully. Blood pressure medicines must be taken as prescribed.  Do not skip doses of blood pressure medicine. Doing this puts you at risk for problems and can make the medicine less effective.  Ask your health care provider about side effects or reactions to medicines that you should watch for. Contact a health care provider if:  You think you are having a reaction to a medicine you are taking.  You have headaches that keep coming back (recurring).  You feel dizzy.  You have swelling in your ankles.  You have trouble with your vision. Get help right away if:  You develop a severe headache or confusion.  You have unusual weakness or numbness.  You feel faint.  You have severe pain in your chest or  abdomen.  You vomit repeatedly.  You have trouble breathing. Summary  Hypertension is when the force of blood pumping through your arteries is too strong. If this condition is not controlled, it may put you at risk for serious complications.  Your personal target blood pressure may vary depending on your medical conditions, your age, and other factors. For most people, a normal blood pressure is less than 120/80.  Hypertension is treated with lifestyle changes, medicines, or a combination of both. Lifestyle changes include weight loss, eating a healthy, low-sodium diet, exercising more, and limiting alcohol. This information is not intended to replace advice given to you by your health care provider. Make sure you discuss any questions you have with your health care provider. Document Released: 12/11/2005 Document Revised: 11/08/2016  Document Reviewed: 11/08/2016 Elsevier Interactive Patient Education  2019 [employer].

## 2019-02-25 NOTE — Assessment & Plan Note (Signed)
Recurrent SCC follows with dermatology, Dr Allyson Sabal encouraged to proceed with follow up

## 2019-02-25 NOTE — Telephone Encounter (Signed)
Pt's insurance called to inform that Linzess was denied because the diagnosis: "unspecified constipation" is not an FDA medically accepted use.    Ins stated that constipation will be approved but not unspecified.

## 2019-02-26 ENCOUNTER — Telehealth: Payer: Self-pay | Admitting: Gastroenterology

## 2019-02-26 DIAGNOSIS — N200 Calculus of kidney: Secondary | ICD-10-CM | POA: Insufficient documentation

## 2019-02-26 HISTORY — DX: Calculus of kidney: N20.0

## 2019-02-26 LAB — URINE CULTURE
MICRO NUMBER:: 269937
SPECIMEN QUALITY:: ADEQUATE

## 2019-02-26 NOTE — Telephone Encounter (Signed)
Called and spoke to [facility] at [phone].  They have denied the request for Linzess 186mcg once daily due to the diagnosis code for "constipation, unspecified".  I tried to update the diagnosis code to Chronic idiopathic constipation but they said I would have to do an appeal. The woman I spoke to said they would fax [place] the denial so we can start the appeal.

## 2019-02-26 NOTE — Assessment & Plan Note (Signed)
Try cleaning with witch hazel astringent and use anusol cream prn. Return to gastroenterology if worsens.

## 2019-02-26 NOTE — Telephone Encounter (Signed)
Spoke to pt and let her know the PA was denied and I have filed an appeal with [employer].  She indicated she was able to pick up the initial script and pay about $37 but then she received the denial. I let her know we will call her when we get an answer or she may just hear from her pharmacy.

## 2019-02-26 NOTE — Progress Notes (Signed)
Subjective:    Patient ID: [name], female    DOB: 01-26-1946, 73 y.o.   MRN: [id number]  Chief Complaint  Patient presents with  . New Patient (Initial Visit)    HPI Patient is in today for new patient appointment. She had previously been with our practice, had transferred to another office for a time and is back now to reestablish care. She has been struggling with recurrent sinusitis this year with 5 different antibiotics in 6 months. She has a deviated septum and allergies at baselin. She has also had a history of kidney stones and undergone lithotripsy and has done better since then. No recent febrile illness or hospitalizations. She has rectal irritation and hemorrhoids with frequent irritation. She also has a history of hypertension, hyperlipidemia with statin intolerance and more. Denies CP/palp/SOB/HA/congestion/fevers/GI c/o. Taking meds as prescribed  Past Medical History:  Diagnosis Date  . Allergy   . Anemia   . Anxiety    past hx   . Arthritis   . Asthma   . Atrial flutter ([place])    past history- not current  . CAD (coronary artery disease) CARDIOLOGIST--  DR Angelena Form   mild non-obstructive cad  . Cancer ([place])    right  . Cataract    bilaterally removed   . Chronic constipation   . Chronic kidney disease    interstitial cystitis  . COPD (chronic obstructive pulmonary disease) ([place])   . Depression    past hx   . Family history of malignant hyperthermia    father had this  . Fibromyalgia   . Frequency of urination   . GERD (gastroesophageal reflux disease)   . H/O hiatal hernia   . History of basal cell carcinoma excision    X2  . History of breast cancer ONCOLOGIST-- DR Jana Hakim---  NO RECURRANCE   DX 07/2012;  LOW GRADE DCIS  ER+PR+  ----  S/P RIGHT LUMPECTOMY WITH NEGATIVE MARGINS/   RADIATION ENDED 11/2012  . History of chronic bronchitis   . History of colonic polyps   . History of tachycardia    CONTROLLED  WITH ATENOLOL  . Hyperlipidemia   .  Hypertension   . Neuromuscular disorder (H[facility])    fibromyalgia  . Osteoporosis 01/2019   T score -2.2 stable/improved from prior study  . Pelvic pain   . Personal history of radiation therapy   . S/P radiation therapy 11/12/12 - 12/05/12   right Breast  . Sepsis ([place]) 2014   from UTI   . Sinus headache   . Urgency of urination     Past Surgical History:  Procedure Laterality Date  . [place] STUDY N/A 04/03/2016   Procedure: [place] STUDY;  Surgeon: Manus Gunning, MD;  Location: [facility];  Service: Gastroenterology;  Laterality: N/A;  . BREAST LUMPECTOMY Right 10-11-2012   W/ SLN BX  . CARDIAC CATHETERIZATION  09-13-2007  DR Lia Foyer   WELL-PRESERVED LVF/  DIFFUSE SCATTERED CORONARY CALCIFACATION AND ATHEROSCLEROSIS WITHOUT OBSTRUCTION  . CARDIAC CATHETERIZATION  08-04-2010  DR Angelena Form   NON-OBSTRUCTIVE CAD/  pLAD 40%/  oLAD 30%/  mLAD 30%/  pRCA 30%/  EF 60%  . CARDIOVASCULAR STRESS TEST  06-18-2012  DR McALHANY   LOW RISK NUCLEAR STUDY/  SMALL FIXED AREA OF MODERATELY DECREASED UPTAKE IN ANTEROSEPTAL WALL WHICH MAY BE ARTIFACTUAL/  NO ISCHEMIA/  EF 68%  . COLONOSCOPY  09-29-2010  . CYSTOSCOPY    . CYSTOSCOPY WITH HYDRODISTENSION AND BIOPSY N/A 03/06/2014  Procedure: CYSTOSCOPY/HYDRODISTENSION/ INSTILATION OF MARCAINE AND PYRIDIUM;  Surgeon: Ailene Rud, MD;  Location[facility];  Service: Urology;  Laterality: N/A;  . ESOPHAGEAL MANOMETRY N/A 04/03/2016   Procedure: ESOPHAGEAL MANOMETRY (EM);  Surgeon: Manus Gunning, MD;  Location: [facility];  Service: Gastroenterology;  Laterality: N/A;  . EXTRACORPOREAL SHOCK WAVE LITHOTRIPSY Left 02/06/2019   Procedure: EXTRACORPOREAL SHOCK WAVE LITHOTRIPSY (ESWL);  Surgeon: Kathie Rhodes, MD;  Location: [facility];  Service: Urology;  Laterality: Left;  . NASAL SINUS SURGERY  1985  . ORIF RIGHT ANKLE  FX  2006  . POLYPECTOMY    . REMOVAL VOCAL CORD CYST  FEB 2014  . RIGHT BREAST BX   08-23-2012  . RIGHT HAND SURGERY  X3  LAST ONE 2009   INCLUDES  ORIF RIGHT 5TH FINGER AND REVISION TWICE  . SKIN CANCER EXCISION    . TONSILLECTOMY AND ADENOIDECTOMY  AGE 73  . TOTAL ABDOMINAL HYSTERECTOMY W/ BILATERAL SALPINGOOPHORECTOMY  1982   W/  APPENDECTOMY  . TRANSTHORACIC ECHOCARDIOGRAM  06-24-2012   GRADE I DIASTOLIC DYSFUNCTION/  EF 55-60%/  MILD MR  . UPPER GASTROINTESTINAL ENDOSCOPY      Family History  Problem Relation Age of Onset  . Rectal cancer Mother   . Colon cancer Mother   . Pancreatic cancer Mother   . Diabetes Mother   . Breast cancer Mother 31  . Breast cancer Maternal Aunt        breast  . Irritable bowel syndrome Son   . Heart disease Son        CAD, MV replacement  . Allergic Disorder Daughter   . Colon cancer Father   . Colon polyps Father   . Diabetes Father   . Stroke Father   . Heart disease Father        CHF  . Hyperlipidemia Father   . Hypertension Father   . Arthritis Father   . Breast cancer Paternal Aunt   . Breast cancer Paternal Aunt   . Arthritis Paternal Uncle   . Allergic Disorder Daughter   . Esophageal cancer Neg Hx   . Stomach cancer Neg Hx     Social History   Socioeconomic History  . Marital status: Married    Spouse name: Jori Moll   . Number of children: 3  . Years of education: BA  . Highest education level: Not on file  Occupational History  . Occupation: Retired Programmer, multimedia: RETIRED  Social Needs  . Financial resource strain: Not on file  . Food insecurity:    Worry: Not on file    Inability: Not on file  . Transportation needs:    Medical: Not on file    Non-medical: Not on file  Tobacco Use  . Smoking status: Former Smoker    Packs/day: 1.00    Years: 15.00    Pack years: 15.00    Types: Cigarettes    Last attempt to quit: 05/27/2005    Years since quitting: 13.7  . Smokeless tobacco: Never Used  Substance and Sexual Activity  . Alcohol use: No    Alcohol/week: 0.0 standard drinks  . Drug use:  No  . Sexual activity: Yes    Partners: Male    Comment: 1st intercourse 73 yo-Fewer than 5 partners  Lifestyle  . Physical activity:    Days per week: Not on file    Minutes per session: Not on file  . Stress: Not on file  Relationships  .  Social connections:    Talks on phone: Not on file    Gets together: Not on file    Attends religious service: Not on file    Active member of club or organization: Not on file    Attends meetings of clubs or organizations: Not on file    Relationship status: Not on file  . Intimate partner violence:    Fear of current or ex partner: Not on file    Emotionally abused: Not on file    Physically abused: Not on file    Forced sexual activity: Not on file  Other Topics Concern  . Not on file  Social History Narrative   Lives with husband.   Caffeine use: 1/2 cup per day   Exercise-- 2days a week [employer],  Water aerobics, walking       Retired Marine scientist   No dietary restrictions, tries to maintain a heart healthy diet    Outpatient Medications Prior to Visit  Medication Sig Dispense Refill  . acetaminophen (TYLENOL) 500 MG tablet Take 1,000 mg by mouth every 6 (six) hours as needed for mild pain or headache.     . albuterol (PROVENTIL HFA;VENTOLIN HFA) 108 (90 BASE) MCG/ACT inhaler Inhale 2 puffs into the lungs every 4 (four) hours as needed. 1 Inhaler 5  . aspirin EC 81 MG tablet Take 1 tablet (81 mg total) by mouth daily. 90 tablet 3  . atenolol (TENORMIN) 25 MG tablet TAKE 1 TABLET BY MOUTH THREE TIMES DAILY BEFORE MEAL(S) 90 tablet 2  . denosumab (PROLIA) 60 MG/ML SOSY injection Inject 60 mg into the skin every 6 (six) months.    . DULoxetine (CYMBALTA) 60 MG capsule Take 120 mg by mouth daily.    . Estradiol 10 MCG TABS vaginal tablet Place 1 tablet (10 mcg total) vaginally 2 (two) times a week. 8 tablet 11  . Evolocumab (REPATHA SURECLICK) 169 MG/ML SOAJ Inject 140 mg into the skin every 14 (fourteen) days. 1 pen 12  . fluticasone (FLONASE) 50  MCG/ACT nasal spray Place 1 spray into the nose as needed for allergies.     . furosemide (LASIX) 20 MG tablet Take 20 mg by mouth as needed for edema.    . gabapentin (NEURONTIN) 300 MG capsule Take 1 capsule (300 mg total) by mouth 2 (two) times daily. 60 capsule 6  . linaclotide (LINZESS) 145 MCG CAPS capsule Take 1 capsule (145 mcg total) by mouth daily before breakfast. 30 capsule 5  . loratadine (CLARITIN) 10 MG tablet Take 1 tablet (10 mg total) by mouth daily. 30 tablet 11  . losartan (COZAAR) 100 MG tablet TAKE 1 TABLET BY MOUTH ONCE DAILY 90 tablet 0  . omeprazole (PRILOSEC) 40 MG capsule Take 40 mg by mouth 2 (two) times daily.    . pentosan polysulfate (ELMIRON) 100 MG capsule Take 1 capsule (100 mg total) by mouth 3 (three) times daily. Reported on 01/05/2016 (Patient taking differently: Take 100 mg by mouth 3 (three) times daily with meals as needed. Reported on 01/05/2016) 90 capsule 11  . traMADol (ULTRAM) 50 MG tablet Take 1-2 tablets (50-100 mg total) by mouth 3 (three) times daily as needed for moderate pain. (Patient taking differently: Take 50-100 mg by mouth 3 (three) times daily as needed for moderate pain (pt uses TID). ) 180 tablet 0  . traZODone (DESYREL) 100 MG tablet Take 1 tablet (100 mg total) by mouth at bedtime. 180 tablet 1  . levofloxacin (LEVAQUIN) 500 MG tablet  Take 500 mg by mouth daily.    Marland Kitchen amLODipine (NORVASC) 5 MG tablet Take 1 tablet (5 mg total) by mouth daily. (Patient taking differently: Take 5 mg by mouth every evening. ) 90 tablet 3  . HYDROcodone-acetaminophen (NORCO) 10-325 MG tablet Take 1-2 tablets by mouth every 4 (four) hours as needed for moderate pain. Maximum dose per 24 hours - 8 pills 20 tablet 0  . tamsulosin (FLOMAX) 0.4 MG CAPS capsule Take 1 capsule (0.4 mg total) by mouth daily after supper. 30 capsule 0   Facility-Administered Medications Prior to Visit  Medication Dose Route Frequency Provider Last Rate Last Dose  . bupivacaine  (MARCAINE) 0.5 % 10 mL, triamcinolone acetonide (KENALOG-40) 40 mg injection   Subcutaneous Once Carolan Clines, MD      . bupivacaine (MARCAINE) 0.5 % 15 mL, phenazopyridine (PYRIDIUM) 400 mg bladder mixture   Bladder Instillation Once Carolan Clines, MD        Allergies  Allergen Reactions  . Clindamycin Hcl Shortness Of Breath and Rash  . Penicillins Anaphylaxis  . Prednisone Shortness Of Breath and Rash  . Baclofen Other (See Comments) and Rash  . Cortisone Other (See Comments)  . Lincomycin Other (See Comments)  . Codeine Hives and Other (See Comments)    headache  . Erythromycin Base Other (See Comments)    other  . Fluzone [Flu Virus Vaccine] Other (See Comments)    Local reaction at the site  . Haemophilus Influenzae Other (See Comments)    Local reaction at the site Local reaction at the site  . Latex Hives  . Pentazocine Lactate Other (See Comments)    HALLUCINATION  . Pneumococcal Vaccine Polyvalent Hives, Swelling and Other (See Comments)    REACTION: redness, swelling, and hives at injection site  . Tamoxifen Nausea And Vomiting and Other (See Comments)    HEADACHE    Review of Systems  Constitutional: Positive for malaise/fatigue. Negative for fever.  HENT: Positive for congestion.   Eyes: Negative for blurred vision.  Respiratory: Negative for shortness of breath.   Cardiovascular: Negative for chest pain, palpitations and leg swelling.  Gastrointestinal: Negative for abdominal pain, blood in stool and nausea.  Genitourinary: Negative for dysuria and frequency.  Musculoskeletal: Negative for falls.  Skin: Negative for rash.  Neurological: Negative for dizziness, loss of consciousness and headaches.  Endo/Heme/Allergies: Negative for environmental allergies.  Psychiatric/Behavioral: Positive for depression. The patient is nervous/anxious.        Objective:    Physical Exam Vitals signs and nursing note reviewed.  Constitutional:      General:  She is not in acute distress.    Appearance: She is well-developed.  HENT:     Head: Normocephalic and atraumatic.     Nose: Nose normal.  Eyes:     General:        Right eye: No discharge.        Left eye: No discharge.  Neck:     Musculoskeletal: Normal range of motion and neck supple.  Cardiovascular:     Rate and Rhythm: Normal rate and regular rhythm.     Heart sounds: No murmur.  Pulmonary:     Effort: Pulmonary effort is normal.     Breath sounds: Normal breath sounds.  Abdominal:     General: Bowel sounds are normal.     Palpations: Abdomen is soft.     Tenderness: There is no abdominal tenderness.  Skin:    General: Skin is warm and dry.  Neurological:     Mental Status: She is alert and oriented to person, place, and time.     BP 116/72 (BP Location: Left Arm, Patient Position: Sitting, Cuff Size: Normal)   Pulse 71   Temp 97.9 F (36.6 C) (Oral)   Resp 18   Ht 5' 4.5" (1.638 m)   Wt 133 lb 12.5 oz (60.7 kg)   SpO2 97%   BMI 22.61 kg/m  Wt Readings from Last 3 Encounters:  02/25/19 133 lb 12.5 oz (60.7 kg)  02/06/19 131 lb 6 oz (59.6 kg)  12/24/18 134 lb (60.8 kg)     Lab Results  Component Value Date   WBC 5.9 09/10/2017   HGB 12.2 09/10/2017   HCT 38.0 09/10/2017   PLT 286 09/10/2017   GLUCOSE 79 09/03/2018   CHOL 156 09/03/2018   TRIG 95 09/03/2018   HDL 52 09/03/2018   LDLDIRECT 117.3 04/08/2012   LDLCALC 85 09/03/2018   ALT 12 09/03/2018   AST 19 09/03/2018   NA 139 09/03/2018   K 4.6 09/03/2018   CL 102 09/03/2018   CREATININE 0.98 09/03/2018   BUN 15 09/03/2018   CO2 25 09/03/2018   TSH 1.920 09/10/2017   INR 1.05 08/03/2010   HGBA1C 5.8 03/01/2015   MICROALBUR 0.2 07/10/2013    Lab Results  Component Value Date   TSH 1.920 09/10/2017   Lab Results  Component Value Date   WBC 5.9 09/10/2017   HGB 12.2 09/10/2017   HCT 38.0 09/10/2017   MCV 94 09/10/2017   PLT 286 09/10/2017   Lab Results  Component Value Date   NA  139 09/03/2018   K 4.6 09/03/2018   CHLORIDE 102 04/20/2015   CO2 25 09/03/2018   GLUCOSE 79 09/03/2018   BUN 15 09/03/2018   CREATININE 0.98 09/03/2018   BILITOT 0.3 09/03/2018   ALKPHOS 101 09/03/2018   AST 19 09/03/2018   ALT 12 09/03/2018   PROT 6.4 09/03/2018   ALBUMIN 4.0 09/03/2018   CALCIUM 8.9 09/03/2018   ANIONGAP 12 (H) 04/20/2015   EGFR 49 (L) 04/20/2015   GFR 51.89 (L) 06/28/2017   Lab Results  Component Value Date   CHOL 156 09/03/2018   Lab Results  Component Value Date   HDL 52 09/03/2018   Lab Results  Component Value Date   LDLCALC 85 09/03/2018   Lab Results  Component Value Date   TRIG 95 09/03/2018   Lab Results  Component Value Date   CHOLHDL 3.0 09/03/2018   Lab Results  Component Value Date   HGBA1C 5.8 03/01/2015       Assessment & Plan:   Problem List Items Addressed This Visit    Essential hypertension    Well controlled, no changes to meds. Encouraged heart healthy diet such as the DASH diet and exercise as tolerated.       RESOLVED: SKIN CANCER, HX OF    Recurrent SCC follows with dermatology, Dr Allyson Sabal encouraged to proceed with follow up      TOBACCO ABUSE, HX OF   Osteoporosis    Encouraged to get adequate exercise, calcium and vitamin d intake      Stricture and stenosis of esophagus    Follows with gastroenterology, follows with Dr Havery Moros. Was stretched with an endoscopy in September      Renal insufficiency    Monitor and hydrate      Atypical chest pain - Primary   External hemorrhoids    Try cleaning  with witch hazel astringent and use anusol cream prn. Return to gastroenterology if worsens.       Hyperlipidemia    Managed by cardiology. On Repatha.       Family history of malignant hyperthermia    Father unknown anesthesia med      Nasal turbinate hypertrophy    Has had 5 antibiotics in 6 months for recurrent sinusitis is awaiting sinus CT      Recurrent sinusitis    Follows with ENT, Dr  Claiborne Rigg has had 5 abx in 6 months and has a CT scan ordered for a few weeks from now.      Hx of colonic polyp   Kidney stone    Underwent lithotripsy and is doing well       Other Visit Diagnoses    Urinary frequency       Relevant Orders   Urinalysis (Completed)   Urine Culture (Completed)      I have discontinued Cai Duet's levofloxacin, HYDROcodone-acetaminophen, and tamsulosin. I am also having her maintain her fluticasone, acetaminophen, aspirin EC, omeprazole, gabapentin, traZODone, albuterol, loratadine, traMADol, pentosan polysulfate, amLODipine, furosemide, denosumab, Estradiol, DULoxetine, losartan, Evolocumab, linaclotide, and atenolol.  No orders of the defined types were placed in this encounter.    [name], MD

## 2019-02-26 NOTE — Assessment & Plan Note (Signed)
Managed by cardiology. On Repatha.

## 2019-02-26 NOTE — Assessment & Plan Note (Signed)
Encouraged to get adequate exercise, calcium and vitamin d intake 

## 2019-02-26 NOTE — Assessment & Plan Note (Signed)
Has had 5 antibiotics in 6 months for recurrent sinusitis is awaiting sinus CT

## 2019-02-26 NOTE — Assessment & Plan Note (Signed)
Underwent lithotripsy and is doing well

## 2019-02-27 ENCOUNTER — Telehealth: Payer: Self-pay | Admitting: Gastroenterology

## 2019-02-27 NOTE — Telephone Encounter (Signed)
Abigail Butts From [facility] calling back again phone # [phone]

## 2019-02-27 NOTE — Telephone Encounter (Signed)
° °  linzess was Approved from 02/24/2019 -02/24/2020  Confirmation #: [id number]

## 2019-02-27 NOTE — Telephone Encounter (Signed)
Called and spoke to [place]. All questions answered. She said we will have an answer today.

## 2019-02-27 NOTE — Telephone Encounter (Signed)
Abigail Butts from [place] called needing to ask a question about the appeal for the pt linzess that was put in. She is wanting to know has the pt has chronic idiopathic constipation 41mts or greater?? She Is needing the answer today.

## 2019-02-28 NOTE — Telephone Encounter (Signed)
Called and informed pt.  She expressed understanding

## 2019-03-06 DIAGNOSIS — L821 Other seborrheic keratosis: Secondary | ICD-10-CM | POA: Diagnosis not present

## 2019-03-06 DIAGNOSIS — L853 Xerosis cutis: Secondary | ICD-10-CM | POA: Diagnosis not present

## 2019-03-06 DIAGNOSIS — D1801 Hemangioma of skin and subcutaneous tissue: Secondary | ICD-10-CM | POA: Diagnosis not present

## 2019-03-06 DIAGNOSIS — Z85828 Personal history of other malignant neoplasm of skin: Secondary | ICD-10-CM | POA: Diagnosis not present

## 2019-03-10 ENCOUNTER — Ambulatory Visit (INDEPENDENT_AMBULATORY_CARE_PROVIDER_SITE_OTHER): Payer: Medicare Other | Admitting: Medical

## 2019-03-10 ENCOUNTER — Ambulatory Visit: Payer: Self-pay

## 2019-03-10 ENCOUNTER — Encounter: Payer: Self-pay | Admitting: Medical

## 2019-03-10 ENCOUNTER — Other Ambulatory Visit: Payer: Self-pay

## 2019-03-10 ENCOUNTER — Ambulatory Visit (HOSPITAL_BASED_OUTPATIENT_CLINIC_OR_DEPARTMENT_OTHER)
Admission: RE | Admit: 2019-03-10 | Discharge: 2019-03-10 | Disposition: A | Discharge status: Home / Self Care | Payer: Medicare Other | Source: Ambulatory Visit | Attending: Medical | Admitting: Medical

## 2019-03-10 VITALS — BP 135/87 | HR 71 | Temp 98.3°F | Resp 16 | Ht 64.5 in | Wt 134.4 lb

## 2019-03-10 DIAGNOSIS — R0781 Pleurodynia: Secondary | ICD-10-CM | POA: Insufficient documentation

## 2019-03-10 DIAGNOSIS — T148XXA Other injury of unspecified body region, initial encounter: Secondary | ICD-10-CM

## 2019-03-10 DIAGNOSIS — M25522 Pain in left elbow: Secondary | ICD-10-CM | POA: Diagnosis not present

## 2019-03-10 DIAGNOSIS — M546 Pain in thoracic spine: Secondary | ICD-10-CM | POA: Diagnosis not present

## 2019-03-10 DIAGNOSIS — L089 Local infection of the skin and subcutaneous tissue, unspecified: Secondary | ICD-10-CM | POA: Diagnosis not present

## 2019-03-10 DIAGNOSIS — S0093XA Contusion of unspecified part of head, initial encounter: Secondary | ICD-10-CM

## 2019-03-10 DIAGNOSIS — S59902A Unspecified injury of left elbow, initial encounter: Secondary | ICD-10-CM | POA: Diagnosis not present

## 2019-03-10 DIAGNOSIS — S299XXA Unspecified injury of thorax, initial encounter: Secondary | ICD-10-CM | POA: Diagnosis not present

## 2019-03-10 MED ORDER — MUPIROCIN 2 % EX OINT: TOPICAL_OINTMENT | CUTANEOUS | 0 refills | Status: DC

## 2019-03-10 MED ORDER — DOXYCYCLINE HYCLATE 100 MG PO TABS: 100.0000 mg | ORAL_TABLET | Freq: Two times a day (BID) | ORAL | 0 refills | Status: DC

## 2019-03-10 NOTE — Patient Instructions (Addendum)
You did have a fall this past Friday.  Suffered injury to your thoracic region, left rib regions and left elbow.  We will get x-rays of all these areas.  Make sure no fracture seen.  Would recommend that she continue with Tylenol and tramadol for pain as you are doing presently.  You did have a head contusion but no loss of consciousness.  No gross motor or sensory function deficits reported.  Good neurologic exam.  You do report chronic daily headaches but no headache worse than your baseline.  If you have worse signs and symptoms as discussed then recommend ED evaluation for CT imaging.  Presently I do not think imaging of head indicated.  For your left elbow abrasion region, I did get wound culture today.  Will prescribe mupirocin on topical antibiotic to apply twice daily.  If area gets more swollen or any creamy yellow discharge then can start print prescription of doxycycline.  Rx advisement given.  If wound not worsening then recommend holding off on antibiotic until wound culture comes back.  For recent nasal congestion, advised use flonase and otc antihistamine.   Follow-up in 7 days.  I will see if your small flap of skin is viable.  If it starts to drop and curl then will need to trim that off.  Follow-up as needed before then.

## 2019-03-10 NOTE — Progress Notes (Signed)
Subjective:    Patient ID: [name], female    DOB: 10/26/1946, 73 y.o.   MRN: [id number]  HPI  Pt in for recent fall injury. She states she fell and hit right side of her head. She somehow landed on left side of her back as well. She points to left scapula area and upper t-spine area as source of pain.  She fell on this past Friday.  Pt states left forearm abrasion pealed some of her skin back. She was trying to leave skin intact but skin pealed out. Pt not sure if has dc.  Pt fell on tile floor. No loc. She was talking with someone on phone. Pt states she has baseline chronic almost daily. She states ha related to stress, fibromyalgia and htn. But no worse ha than usual.   She states can breath with no pain.  Pt states upper forearm is sore.  Pt only adding tylenol to her tramadol regimen.    Review of Systems  Constitutional: Negative for chills, fatigue and fever.  HENT: Positive for congestion. Negative for facial swelling, mouth sores, postnasal drip and rhinorrhea.   Respiratory: Negative for apnea, cough, choking, chest tightness, shortness of breath and wheezing.   Cardiovascular: Negative for chest pain and palpitations.  Gastrointestinal: Negative for abdominal pain.  Genitourinary: Negative for dysuria and flank pain.  Musculoskeletal: Negative for back pain and joint swelling.       See hpi.  Skin: Negative for rash.       Left forearm abrasion.  Neurological: Positive for headaches. Negative for dizziness, seizures, syncope, facial asymmetry, speech difficulty and weakness.       Not worse than typical ha.  Hematological: Negative for adenopathy. Does not bruise/bleed easily.  Psychiatric/Behavioral: Negative for decreased concentration, dysphoric mood, sleep disturbance and suicidal ideas. The patient is not nervous/anxious.     Past Medical History:  Diagnosis Date  . Allergy   . Anemia   . Anxiety    past hx   . Arthritis   . Asthma   . Atrial  flutter ([place])    past history- not current  . CAD (coronary artery disease) CARDIOLOGIST--  DR Angelena Form   mild non-obstructive cad  . Cancer ([place])    right  . Cataract    bilaterally removed   . Chronic constipation   . Chronic kidney disease    interstitial cystitis  . COPD (chronic obstructive pulmonary disease) ([place])   . Depression    past hx   . Family history of malignant hyperthermia    father had this  . Fibromyalgia   . Frequency of urination   . GERD (gastroesophageal reflux disease)   . H/O hiatal hernia   . History of basal cell carcinoma excision    X2  . History of breast cancer ONCOLOGIST-- DR Jana Hakim---  NO RECURRANCE   DX 07/2012;  LOW GRADE DCIS  ER+PR+  ----  S/P RIGHT LUMPECTOMY WITH NEGATIVE MARGINS/   RADIATION ENDED 11/2012  . History of chronic bronchitis   . History of colonic polyps   . History of tachycardia    CONTROLLED  WITH ATENOLOL  . Hyperlipidemia   . Hypertension   . Neuromuscular disorder (H[facility])    fibromyalgia  . Osteoporosis 01/2019   T score -2.2 stable/improved from prior study  . Pelvic pain   . Personal history of radiation therapy   . S/P radiation therapy 11/12/12 - 12/05/12   right Breast  . Sepsis ([place]) 2014  from UTI   . Sinus headache   . Urgency of urination      Social History   Socioeconomic History  . Marital status: Married    Spouse name: [name]   . Number of children: 3  . Years of education: BA  . Highest education level: Not on file  Occupational History  . Occupation: Retired Programmer, multimedia: RETIRED  Social Needs  . Financial resource strain: Not on file  . Food insecurity:    Worry: Not on file    Inability: Not on file  . Transportation needs:    Medical: Not on file    Non-medical: Not on file  Tobacco Use  . Smoking status: Former Smoker    Packs/day: 1.00    Years: 15.00    Pack years: 15.00    Types: Cigarettes    Last attempt to quit: 05/27/2005    Years since quitting: 13.7  .  Smokeless tobacco: Never Used  Substance and Sexual Activity  . Alcohol use: No    Alcohol/week: 0.0 standard drinks  . Drug use: No  . Sexual activity: Yes    Partners: Male    Comment: 1st intercourse 73 yo-Fewer than 5 partners  Lifestyle  . Physical activity:    Days per week: Not on file    Minutes per session: Not on file  . Stress: Not on file  Relationships  . Social connections:    Talks on phone: Not on file    Gets together: Not on file    Attends religious service: Not on file    Active member of club or organization: Not on file    Attends meetings of clubs or organizations: Not on file    Relationship status: Not on file  . Intimate partner violence:    Fear of current or ex partner: Not on file    Emotionally abused: Not on file    Physically abused: Not on file    Forced sexual activity: Not on file  Other Topics Concern  . Not on file  Social History Narrative   Lives with husband.   Caffeine use: 1/2 cup per day   Exercise-- 2days a week [employer],  Water aerobics, walking       Retired Marine scientist   No dietary restrictions, tries to maintain a heart healthy diet    Past Surgical History:  Procedure Laterality Date  . [place] STUDY N/A 04/03/2016   Procedure: [place] STUDY;  Surgeon: Manus Gunning, MD;  Location: [facility];  Service: Gastroenterology;  Laterality: N/A;  . BREAST LUMPECTOMY Right 10-11-2012   W/ SLN BX  . CARDIAC CATHETERIZATION  09-13-2007  DR Lia Foyer   WELL-PRESERVED LVF/  DIFFUSE SCATTERED CORONARY CALCIFACATION AND ATHEROSCLEROSIS WITHOUT OBSTRUCTION  . CARDIAC CATHETERIZATION  08-04-2010  DR Angelena Form   NON-OBSTRUCTIVE CAD/  pLAD 40%/  oLAD 30%/  mLAD 30%/  pRCA 30%/  EF 60%  . CARDIOVASCULAR STRESS TEST  06-18-2012  DR McALHANY   LOW RISK NUCLEAR STUDY/  SMALL FIXED AREA OF MODERATELY DECREASED UPTAKE IN ANTEROSEPTAL WALL WHICH MAY BE ARTIFACTUAL/  NO ISCHEMIA/  EF 68%  . COLONOSCOPY  09-29-2010  . CYSTOSCOPY    .  CYSTOSCOPY WITH HYDRODISTENSION AND BIOPSY N/A 03/06/2014   Procedure: CYSTOSCOPY/HYDRODISTENSION/ INSTILATION OF MARCAINE AND PYRIDIUM;  Surgeon: Ailene Rud, MD;  Location: [facility];  Service: Urology;  Laterality: N/A;  . ESOPHAGEAL MANOMETRY N/A 04/03/2016   Procedure: ESOPHAGEAL  MANOMETRY (EM);  Surgeon: Manus Gunning, MD;  Location: [facility];  Service: Gastroenterology;  Laterality: N/A;  . EXTRACORPOREAL SHOCK WAVE LITHOTRIPSY Left 02/06/2019   Procedure: EXTRACORPOREAL SHOCK WAVE LITHOTRIPSY (ESWL);  Surgeon: Kathie Rhodes, MD;  Location: [facility];  Service: Urology;  Laterality: Left;  . NASAL SINUS SURGERY  1985  . ORIF RIGHT ANKLE  FX  2006  . POLYPECTOMY    . REMOVAL VOCAL CORD CYST  FEB 2014  . RIGHT BREAST BX  08-23-2012  . RIGHT HAND SURGERY  X3  LAST ONE 2009   INCLUDES  ORIF RIGHT 5TH FINGER AND REVISION TWICE  . SKIN CANCER EXCISION    . TONSILLECTOMY AND ADENOIDECTOMY  AGE 17  . TOTAL ABDOMINAL HYSTERECTOMY W/ BILATERAL SALPINGOOPHORECTOMY  1982   W/  APPENDECTOMY  . TRANSTHORACIC ECHOCARDIOGRAM  06-24-2012   GRADE I DIASTOLIC DYSFUNCTION/  EF 55-60%/  MILD MR  . UPPER GASTROINTESTINAL ENDOSCOPY      Family History  Problem Relation Age of Onset  . Rectal cancer Mother   . Colon cancer Mother   . Pancreatic cancer Mother   . Diabetes Mother   . Breast cancer Mother 59  . Breast cancer Maternal Aunt        breast  . Irritable bowel syndrome Son   . Heart disease Son        CAD, MV replacement  . Allergic Disorder Daughter   . Colon cancer Father   . Colon polyps Father   . Diabetes Father   . Stroke Father   . Heart disease Father        CHF  . Hyperlipidemia Father   . Hypertension Father   . Arthritis Father   . Breast cancer Paternal Aunt   . Breast cancer Paternal Aunt   . Arthritis Paternal Uncle   . Allergic Disorder Daughter   . Esophageal cancer Neg Hx   . Stomach cancer Neg Hx     Allergies  Allergen  Reactions  . Clindamycin Hcl Shortness Of Breath and Rash  . Penicillins Anaphylaxis  . Prednisone Shortness Of Breath and Rash  . Baclofen Other (See Comments) and Rash  . Cortisone Other (See Comments)  . Lincomycin Other (See Comments)  . Codeine Hives and Other (See Comments)    headache  . Erythromycin Base Other (See Comments)    other  . Fluzone [Flu Virus Vaccine] Other (See Comments)    Local reaction at the site  . Haemophilus Influenzae Other (See Comments)    Local reaction at the site Local reaction at the site  . Latex Hives  . Pentazocine Lactate Other (See Comments)    HALLUCINATION  . Pneumococcal Vaccine Polyvalent Hives, Swelling and Other (See Comments)    REACTION: redness, swelling, and hives at injection site  . Tamoxifen Nausea And Vomiting and Other (See Comments)    HEADACHE    Current Outpatient Medications on File Prior to Visit  Medication Sig Dispense Refill  . acetaminophen (TYLENOL) 500 MG tablet Take 1,000 mg by mouth every 6 (six) hours as needed for mild pain or headache.     . albuterol (PROVENTIL HFA;VENTOLIN HFA) 108 (90 BASE) MCG/ACT inhaler Inhale 2 puffs into the lungs every 4 (four) hours as needed. 1 Inhaler 5  . aspirin EC 81 MG tablet Take 1 tablet (81 mg total) by mouth daily. 90 tablet 3  . atenolol (TENORMIN) 25 MG tablet TAKE 1 TABLET BY MOUTH THREE TIMES DAILY BEFORE MEAL(S) 90  tablet 2  . denosumab (PROLIA) 60 MG/ML SOSY injection Inject 60 mg into the skin every 6 (six) months.    . DULoxetine (CYMBALTA) 60 MG capsule Take 120 mg by mouth daily.    . Estradiol 10 MCG TABS vaginal tablet Place 1 tablet (10 mcg total) vaginally 2 (two) times a week. 8 tablet 11  . Evolocumab (REPATHA SURECLICK) 016 MG/ML SOAJ Inject 140 mg into the skin every 14 (fourteen) days. 1 pen 12  . fluticasone (FLONASE) 50 MCG/ACT nasal spray Place 1 spray into the nose as needed for allergies.     . furosemide (LASIX) 20 MG tablet Take 20 mg by mouth as  needed for edema.    . gabapentin (NEURONTIN) 300 MG capsule Take 1 capsule (300 mg total) by mouth 2 (two) times daily. 60 capsule 6  . linaclotide (LINZESS) 145 MCG CAPS capsule Take 1 capsule (145 mcg total) by mouth daily before breakfast. 30 capsule 5  . loratadine (CLARITIN) 10 MG tablet Take 1 tablet (10 mg total) by mouth daily. 30 tablet 11  . losartan (COZAAR) 100 MG tablet TAKE 1 TABLET BY MOUTH ONCE DAILY 90 tablet 0  . omeprazole (PRILOSEC) 40 MG capsule Take 40 mg by mouth 2 (two) times daily.    . pentosan polysulfate (ELMIRON) 100 MG capsule Take 1 capsule (100 mg total) by mouth 3 (three) times daily. Reported on 01/05/2016 (Patient taking differently: Take 100 mg by mouth 3 (three) times daily with meals as needed. Reported on 01/05/2016) 90 capsule 11  . traMADol (ULTRAM) 50 MG tablet Take 1-2 tablets (50-100 mg total) by mouth 3 (three) times daily as needed for moderate pain. (Patient taking differently: Take 50-100 mg by mouth 3 (three) times daily as needed for moderate pain (pt uses TID). ) 180 tablet 0  . traZODone (DESYREL) 100 MG tablet Take 1 tablet (100 mg total) by mouth at bedtime. 180 tablet 1  . amLODipine (NORVASC) 5 MG tablet Take 1 tablet (5 mg total) by mouth daily. (Patient taking differently: Take 5 mg by mouth every evening. ) 90 tablet 3   Current Facility-Administered Medications on File Prior to Visit  Medication Dose Route Frequency Provider Last Rate Last Dose  . bupivacaine (MARCAINE) 0.5 % 10 mL, triamcinolone acetonide (KENALOG-40) 40 mg injection   Subcutaneous Once Carolan Clines, MD      . bupivacaine (MARCAINE) 0.5 % 15 mL, phenazopyridine (PYRIDIUM) 400 mg bladder mixture   Bladder Instillation Once Carolan Clines, MD        BP 135/87   Pulse 71   Temp 98.3 F (36.8 C) (Oral)   Resp 16   Ht 5' 4.5" (1.638 m)   Wt 134 lb 6.4 oz (61 kg)   SpO2 98%   BMI 22.71 kg/m       Objective:   Physical Exam  General Mental Status-  Alert. General Appearance- Not in acute distress.   Skin Rt side of scalp is mild tender to palpation. Not real swollen. Does have abrasion breast. Some slight creamy discharge. Left upper forearm abrasion.  Neck Carotid Arteries- Normal color. Moisture- Normal Moisture. No carotid bruits. No JVD.  Chest and Lung Exam Auscultation: Breath Sounds:-Normal.  Cardiovascular Auscultation:Rythm- Regular. Murmurs & Other Heart Sounds:Auscultation of the heart reveals- No Murmurs.  Abdomen Inspection:-Inspeection Normal. Palpation/Percussion:Note:No mass. Palpation and Percussion of the abdomen reveal- Non Tender, Non Distended + BS, no rebound or guarding.   Neurologic Cranial Nerve exam:- CN III-XII intact(No nystagmus), symmetric  smile. Drift Test:- No drift. Finger to Nose:- Normal/Intact Strength:- 5/5 equal and symmetric strength both upper and lower extremities.  Back- pain inferior /under left scapula area area direct. Also left lower rib area pain. Mild upper t-spine area tender.     Assessment & Plan:  You did have a fall this past Friday.  Suffered injury to your thoracic region, left rib regions and left elbow.  We will get x-rays of all these areas.  Make sure no fracture seen.  Would recommend that she continue with Tylenol and tramadol for pain as you are doing presently.  You did have a head contusion but no loss of consciousness.  No gross motor or sensory function deficits reported.  Good neurologic exam.  You do report chronic daily headaches but no headache worse than your baseline.  If you have worse signs and symptoms as discussed then recommend ED evaluation for CT imaging.  Presently I do not think imaging of head indicated.  For your left elbow abrasion region, I did get wound culture today.  Will prescribe mupirocin on topical antibiotic to apply twice daily.  If area gets more swollen or any creamy yellow discharge then can start print prescription of  doxycycline.  Rx advisement given.  If wound not worsening then recommend holding off on antibiotic until wound culture comes back.  For recent nasal congestion, advised use flonase and otc antihistamine.  Follow-up in 7 days.  I will see if your small flap of skin is viable.  If it starts to drop and curl then will need to trim that off.  Follow-up as needed before then.  40 minutes spent with pt 50% of time spent doing over workup for  injury and treatment going forward. Also explained plan for allergies as well.

## 2019-03-10 NOTE — Telephone Encounter (Signed)
Incoming call from Patient reporting that she fell on Late Friday 03/07/19. Patient reports she hits the left forearm. It appears the skin is shaved off states Patient.  Reports that wound is still seeping.  Patient report her last tetanus shot was 2014.  Patient has been schedule for an appointment today, 03/10/19 at 1:20 pm.  Patient voiced understanding.                                                                                                                                                                                                                                                                                                                                                                                                                                                               Reason for Disposition . [1] Looks infected (spreading redness, pus) AND [2] no fever  Answer Assessment - Initial Assessment Questions 1. APPEARANCE of INJURY: "What does the injury look like?"      Shaved the skin off.  Open wound  2. SIZE: "How large is the cut?"      2.5 by 3 3. BLEEDING: "Is it bleeding now?" If so, ask: "Is it difficult to stop?"      Seeping blood.  4. LOCATION: "Where is the injury located?"  Left  Fore arm 5. ONSET: "How long ago did the injury occur?"      Friday afernoon 6. MECHANISM: "Tell me how it happened."      [place]  7. TETANUS: "When was the last tetanus booster?"     2014.  8. PREGNANCY: "Is there any chance you are pregnant?" "When was your last menstrual period?"  Protocols used: SKIN INJURY-A-AH

## 2019-03-12 DIAGNOSIS — J324 Chronic pansinusitis: Secondary | ICD-10-CM | POA: Diagnosis not present

## 2019-03-12 DIAGNOSIS — J342 Deviated nasal septum: Secondary | ICD-10-CM | POA: Diagnosis not present

## 2019-03-12 DIAGNOSIS — J343 Hypertrophy of nasal turbinates: Secondary | ICD-10-CM | POA: Diagnosis not present

## 2019-03-12 DIAGNOSIS — J310 Chronic rhinitis: Secondary | ICD-10-CM | POA: Diagnosis not present

## 2019-03-12 DIAGNOSIS — J019 Acute sinusitis, unspecified: Secondary | ICD-10-CM | POA: Diagnosis not present

## 2019-03-13 LAB — WOUND CULTURE
MICRO NUMBER:: 323336
RESULT:: NO GROWTH
SPECIMEN QUALITY:: ADEQUATE

## 2019-03-14 ENCOUNTER — Other Ambulatory Visit: Payer: Self-pay | Admitting: Cardiology

## 2019-03-14 DIAGNOSIS — I10 Essential (primary) hypertension: Secondary | ICD-10-CM

## 2019-03-17 ENCOUNTER — Telehealth: Payer: Self-pay

## 2019-03-17 ENCOUNTER — Ambulatory Visit: Payer: Medicare Other | Admitting: Medical

## 2019-03-17 NOTE — Telephone Encounter (Signed)
Tried to reach pt to see how she is doing and if she needs to come in to the office or a telephone office visit. Left pt a message to call back.

## 2019-03-18 ENCOUNTER — Telehealth: Payer: Self-pay | Admitting: Family Medicine

## 2019-03-18 ENCOUNTER — Other Ambulatory Visit: Payer: Self-pay | Admitting: Family Medicine

## 2019-03-18 MED ORDER — GABAPENTIN 300 MG PO CAPS: 300.0000 mg | ORAL_CAPSULE | Freq: Two times a day (BID) | ORAL | 1 refills | Status: DC

## 2019-03-18 NOTE — Telephone Encounter (Signed)
Copied from [place] [id number]. Topic: General - Other >> Mar 18, 2019  1:17 PM Lennox Solders wrote: Reason for CRM: Pt left on refill voice mail she needs a refill on gabapentin, [facility] in [place] on [address]

## 2019-03-18 NOTE — Telephone Encounter (Signed)
Please advise. Gabapentin has been refilled since 2016?

## 2019-03-18 NOTE — Telephone Encounter (Signed)
I sent in a 3 month supply and a refill. Please let her know.

## 2019-03-24 ENCOUNTER — Other Ambulatory Visit: Payer: Self-pay

## 2019-03-24 MED ORDER — EVOLOCUMAB 140 MG/ML ~~LOC~~ SOAJ: 140.0000 mg | SUBCUTANEOUS | 3 refills | Status: DC

## 2019-03-25 ENCOUNTER — Telehealth: Payer: Self-pay | Admitting: Cardiology

## 2019-03-25 NOTE — Telephone Encounter (Signed)
Patient is called stating the pharmacy told her Evolocumab (REPATHA SURECLICK) 824 MG/ML SOAJ was approved I saw a note in epic it was approved until 12/20/19.  See phone 01/28/19

## 2019-04-02 ENCOUNTER — Other Ambulatory Visit: Payer: Self-pay | Admitting: Cardiology

## 2019-04-02 MED ORDER — EVOLOCUMAB 140 MG/ML ~~LOC~~ SOAJ: 140.0000 mg | SUBCUTANEOUS | 11 refills | Status: DC

## 2019-04-02 NOTE — Telephone Encounter (Signed)
Repatha questions.

## 2019-04-02 NOTE — Telephone Encounter (Signed)
Rx sent to prefer pharmacy. Approved by insurance. Pt aware.

## 2019-04-02 NOTE — Telephone Encounter (Signed)
Called [employer]. Talked to [facility]. Question answered.    *may take up to 48hrs for approval*  Talked to patient. Gave update.

## 2019-04-02 NOTE — Telephone Encounter (Signed)
New Message:    Repatha have been approved for 1 year. Coverage starting April 7,2020.

## 2019-04-02 NOTE — Telephone Encounter (Signed)
° ° °  Elta Guadeloupe from [facility] calling with additional questions  Please call [phone] option 5

## 2019-04-18 ENCOUNTER — Other Ambulatory Visit: Payer: Self-pay | Admitting: Cardiology

## 2019-04-18 NOTE — Telephone Encounter (Signed)
Tenormin refilled.

## 2019-04-24 DIAGNOSIS — M5136 Other intervertebral disc degeneration, lumbar region: Secondary | ICD-10-CM | POA: Diagnosis not present

## 2019-04-30 ENCOUNTER — Telehealth: Payer: Self-pay | Admitting: *Deleted

## 2019-04-30 NOTE — Telephone Encounter (Signed)
Deductible Amount met  OOP MAX $4200 (920mt)  Annual exam 11/05/18  Calcium  8.9      Date 09/03/18  Upcoming dental procedures   Prior Authorization needed NO  Pt estimated Cost$242.93       Coverage Details: 20% one dose and 0% admin fee

## 2019-05-07 ENCOUNTER — Ambulatory Visit: Payer: Medicare Other | Admitting: Gynecology

## 2019-05-07 ENCOUNTER — Encounter: Payer: Self-pay | Admitting: Gynecology

## 2019-05-07 ENCOUNTER — Other Ambulatory Visit: Payer: Self-pay

## 2019-05-07 VITALS — BP 124/80

## 2019-05-07 DIAGNOSIS — M810 Age-related osteoporosis without current pathological fracture: Secondary | ICD-10-CM | POA: Diagnosis not present

## 2019-05-07 DIAGNOSIS — N898 Other specified noninflammatory disorders of vagina: Secondary | ICD-10-CM | POA: Diagnosis not present

## 2019-05-07 LAB — WET PREP FOR TRICH, YEAST, CLUE

## 2019-05-07 MED ORDER — DENOSUMAB 60 MG/ML ~~LOC~~ SOSY
60.0000 mg | PREFILLED_SYRINGE | Freq: Once | SUBCUTANEOUS | Status: AC
  Administered 2019-05-07: 60 mg via SUBCUTANEOUS

## 2019-05-07 MED ORDER — METRONIDAZOLE 0.75 % VA GEL: 1.0000 | Freq: Every day | VAGINAL | 0 refills | Status: DC

## 2019-05-07 NOTE — Addendum Note (Signed)
Addended by: Nelva Nay on: 05/07/2019 10:09 AM   Modules accepted: Orders

## 2019-05-07 NOTE — Patient Instructions (Signed)
Office will call you to arrange for the appointment for the dye study as we discussed

## 2019-05-07 NOTE — Progress Notes (Signed)
    [name] 02/22/1946 [id number]        73 y.o.  G3P3 presents with a history of vaginal discharge although not having it today.  Sensation of passing air bubbles through the vagina.  She has a several year history of questionable rectovaginal fistula that has been evaluated by gastroenterology to include negative sigmoidoscopy and by her history a fistulogram which was negative.  I have not been unable to document the fistulogram or other work-up in the chart other than the negative sigmoidoscopy.  She is continue to have this issue on and off particularly after constipation and then using a laxative she seems to have more of the passage of air and some discharge vaginally.  Her exams have always been negative excepting atrophic changes.  Has been on Vagifem twice weekly.  Past medical history,surgical history, problem list, medications, allergies, family history and social history were all reviewed and documented in the EPIC chart.  Directed ROS with pertinent positives and negatives documented in the history of present illness/assessment and plan.  Exam: [name] assistant Vitals:   05/07/19 0923  BP: 124/80   General appearance:  Normal Abdomen soft nontender without mass guarding rebound Pelvic external BUS vagina with atrophic changes.  No significant discharge noted.  No overt fistula noted.  She does have some tufting of her vaginal mucosa posterior midline 2 fingerbreadths within the introitus but unable to demonstrate any drainage from this area directly or on milking with a rectal exam over this area.  Bimanual without masses or tenderness.  Rectal exam is normal  Assessment/Plan:  73 y.o. G3P3 with suspected rectovaginal fistula historically with passage of gas and discharge from the vagina going on for the last several years.  Reported work-up included fistulogram although unable to document this.  I did review a report of sigmoidoscopy that was directly looking for fistula  which was negative.  We discussed various options to include referral to the regional center, even if we demonstrated a fistula which she would do with this as she is not overly bothered by the symptoms and lastly to pursue no further work-up.  At this point I recommended that we will arrange for her to return for a tampon test with methylene blue enema to see if we can document any passage of dye into the vagina at least to confirm evidence of a fistula and then go from there.  I did recommend we treat her with MetroGel vaginal cream before hand and she is can go ahead and start that.  Her wet prep today was negative.    Anastasio Auerbach MD, 9:59 AM 05/07/2019

## 2019-05-14 NOTE — Telephone Encounter (Signed)
Prolia Given 05/07/2019 Next injection 11/08/2019

## 2019-05-20 ENCOUNTER — Ambulatory Visit: Payer: Medicare Other | Admitting: Gynecology

## 2019-05-20 ENCOUNTER — Other Ambulatory Visit: Payer: Self-pay

## 2019-05-20 ENCOUNTER — Encounter: Payer: Self-pay | Admitting: Gynecology

## 2019-05-20 VITALS — BP 118/78

## 2019-05-20 DIAGNOSIS — N823 Fistula of vagina to large intestine: Secondary | ICD-10-CM

## 2019-05-20 NOTE — Patient Instructions (Addendum)
Follow-up in the fall for annual exam, sooner if any worsening symptoms or new symptoms.

## 2019-05-20 NOTE — Progress Notes (Signed)
    [name] 1946-12-18 [id number]        73 y.o.  G3P3 presents for dye study to see if we can locate a rectovaginal fistula.  We discussed this with her last office visit.  Past medical history,surgical history, problem list, medications, allergies, family history and social history were all reviewed and documented in the EPIC chart.  Directed ROS with pertinent positives and negatives documented in the history of present illness/assessment and plan.  Exam: [name] assistant Vitals:   05/20/19 1429  BP: 118/78   General appearance:  Normal Abdomen soft nontender without masses guarding rebound.  Pelvic external BUS vagina with atrophic changes.  Bimanual without masses or tenderness.  A tampon was placed within the vagina.  Subsequently 30 cc of methylene blue stained saline was introduced rectally using a small catheter.  The patient ambulated changing positions and subsequently was reexamined with removal of the tampon with no evidence of dye on the tampon.  The vagina was then vigorously inspected using proctoscopy swab probing along the entire vaginal mucosa and no dye was noted in the vagina.  Assessment/Plan:  73 y.o. G3P3 with history suspicious for rectovaginal fistula but unable to demonstrate it visually/exam, negative evaluation by GI during sigmoidoscopy and now negative dye tampon test.  Discussed options to include referral elsewhere for further testing versus monitoring for now.  She is not overly bothered by symptoms noticing occasional passage of gas through the vagina and dark staining occasionally on her pad.  At this point the patient would prefer just monitoring.  She is due for her annual exam in the fall and will follow-up at that time.  She will read present if significant changes in her symptomatology.  I spent a total of 25 minutes with the patient performing the above testing as well as discussing options with her   Anastasio Auerbach MD, 2:37 PM 05/20/2019

## 2019-06-17 ENCOUNTER — Other Ambulatory Visit: Payer: Self-pay | Admitting: Cardiology

## 2019-06-17 DIAGNOSIS — I10 Essential (primary) hypertension: Secondary | ICD-10-CM

## 2019-07-03 ENCOUNTER — Telehealth: Payer: Self-pay | Admitting: Cardiology

## 2019-07-03 ENCOUNTER — Other Ambulatory Visit: Payer: Self-pay

## 2019-07-03 ENCOUNTER — Ambulatory Visit (INDEPENDENT_AMBULATORY_CARE_PROVIDER_SITE_OTHER): Payer: Medicare Other | Admitting: Family Medicine

## 2019-07-03 VITALS — BP 120/72 | HR 70

## 2019-07-03 DIAGNOSIS — E785 Hyperlipidemia, unspecified: Secondary | ICD-10-CM

## 2019-07-03 DIAGNOSIS — R519 Headache, unspecified: Secondary | ICD-10-CM

## 2019-07-03 DIAGNOSIS — I10 Essential (primary) hypertension: Secondary | ICD-10-CM

## 2019-07-03 DIAGNOSIS — R51 Headache: Secondary | ICD-10-CM | POA: Diagnosis not present

## 2019-07-03 DIAGNOSIS — R0600 Dyspnea, unspecified: Secondary | ICD-10-CM

## 2019-07-03 DIAGNOSIS — N200 Calculus of kidney: Secondary | ICD-10-CM | POA: Diagnosis not present

## 2019-07-03 DIAGNOSIS — R0602 Shortness of breath: Secondary | ICD-10-CM | POA: Diagnosis not present

## 2019-07-03 NOTE — Telephone Encounter (Signed)
Patient would like an appointment with Dr.Crenshaw in about 3-4 weeks. She seen her PCP today and she advised that she was having some SOB and tightness in her chest- PCP is ordering blood work, ECHO and XRAY and suggested she follow up in 3-4 weeks after tests are completed.  Advised I did not see anything available for him, asked if she would be willing to see a PA she states she would rather see Dr.Crenshaw- but if she has too she will but would like to have the schedule checked by his nurse first.  Will route to make her aware.

## 2019-07-03 NOTE — Telephone Encounter (Signed)
New Message            Patient is needing an appointment after her labs on Friday per her PCP [name]. Pls advise

## 2019-07-04 ENCOUNTER — Encounter: Payer: Self-pay | Admitting: Gynecology

## 2019-07-04 NOTE — Telephone Encounter (Signed)
Left message for patient, follow up scheduled in [place] for 07-23-2019 @ 1:40 pm.

## 2019-07-06 MED ORDER — NITROGLYCERIN 0.4 MG SL SUBL: 0.4000 mg | SUBLINGUAL_TABLET | SUBLINGUAL | 1 refills | Status: DC | PRN

## 2019-07-06 NOTE — Assessment & Plan Note (Signed)
Well controlled off of Amlodinpine will . Encouraged heart healthy diet such as the DASH diet and exercise as tolerated. Will restart at 2.5 mg if blood pressure increases again.

## 2019-07-06 NOTE — Assessment & Plan Note (Signed)
Was noting headache after taking Amlodipine so she stopped it temporarily and headaches improved.

## 2019-07-06 NOTE — Assessment & Plan Note (Signed)
With some chest tightness at times. Given NTG to use prn and echo and cxr are ordered today. She will report any worsening of symptoms.

## 2019-07-06 NOTE — Progress Notes (Signed)
Virtual Visit via Video Note  I connected with [name] on 07/03/19 at  9:00 AM EDT by a video enabled telemedicine application and verified that I am speaking with the correct person using two identifiers.  Location: Patient: home Provider: office   I discussed the limitations of evaluation and management by telemedicine and the availability of in person appointments. The patient expressed understanding and agreed to proceed. [name] CMA was able to get the patient set up on video visit.     Subjective:    Patient ID: [name], female    DOB: 02/23/1946, 73 y.o.   MRN: [id number]  No chief complaint on file.   HPI Patient is in today for follow up on hypertension. She has been having headaches, shortness of breath and discomfort in neck and chest at times. She was having headaches so she stopped her Amlodipine and they improved. No recent febrile illness or hospitalizations. Denies congestion/fevers/GI or GU c/o. Taking meds as prescribed  Past Medical History:  Diagnosis Date  . Allergy   . Anemia   . Anxiety    past hx   . Arthritis   . Asthma   . Atrial flutter ([place])    past history- not current  . CAD (coronary artery disease) CARDIOLOGIST--  DR Angelena Form   mild non-obstructive cad  . Cancer ([place])    right  . Cataract    bilaterally removed   . Chronic constipation   . Chronic kidney disease    interstitial cystitis  . COPD (chronic obstructive pulmonary disease) ([place])   . Depression    past hx   . Family history of malignant hyperthermia    father had this  . Fibromyalgia   . Frequency of urination   . GERD (gastroesophageal reflux disease)   . H/O hiatal hernia   . History of basal cell carcinoma excision    X2  . History of breast cancer ONCOLOGIST-- DR Jana Hakim---  NO RECURRANCE   DX 07/2012;  LOW GRADE DCIS  ER+PR+  ----  S/P RIGHT LUMPECTOMY WITH NEGATIVE MARGINS/   RADIATION ENDED 11/2012  . History of chronic bronchitis   . History of  colonic polyps   . History of tachycardia    CONTROLLED  WITH ATENOLOL  . Hyperlipidemia   . Hypertension   . Neuromuscular disorder ([facility])    fibromyalgia  . Osteoporosis 01/2019   T score -2.2 stable/improved from prior study  . Pelvic pain   . Personal history of radiation therapy   . S/P radiation therapy 11/12/12 - 12/05/12   right Breast  . Sepsis ([place]) 2014   from UTI   . Sinus headache   . Urgency of urination     Past Surgical History:  Procedure Laterality Date  . [place] STUDY N/A 04/03/2016   Procedure: [place] STUDY;  Surgeon: Manus Gunning, MD;  Location: [facility];  Service: Gastroenterology;  Laterality: N/A;  . BREAST LUMPECTOMY Right 10-11-2012   W/ SLN BX  . CARDIAC CATHETERIZATION  09-13-2007  DR Lia Foyer   WELL-PRESERVED LVF/  DIFFUSE SCATTERED CORONARY CALCIFACATION AND ATHEROSCLEROSIS WITHOUT OBSTRUCTION  . CARDIAC CATHETERIZATION  08-04-2010  DR Angelena Form   NON-OBSTRUCTIVE CAD/  pLAD 40%/  oLAD 30%/  mLAD 30%/  pRCA 30%/  EF 60%  . CARDIOVASCULAR STRESS TEST  06-18-2012  DR McALHANY   LOW RISK NUCLEAR STUDY/  SMALL FIXED AREA OF MODERATELY DECREASED UPTAKE IN ANTEROSEPTAL WALL WHICH MAY BE ARTIFACTUAL/  NO  ISCHEMIA/  EF 68%  . COLONOSCOPY  09-29-2010  . CYSTOSCOPY    . CYSTOSCOPY WITH HYDRODISTENSION AND BIOPSY N/A 03/06/2014   Procedure: CYSTOSCOPY/HYDRODISTENSION/ INSTILATION OF MARCAINE AND PYRIDIUM;  Surgeon: Ailene Rud, MD;  Location: [facility];  Service: Urology;  Laterality: N/A;  . ESOPHAGEAL MANOMETRY N/A 04/03/2016   Procedure: ESOPHAGEAL MANOMETRY (EM);  Surgeon: Manus Gunning, MD;  Location: [facility];  Service: Gastroenterology;  Laterality: N/A;  . EXTRACORPOREAL SHOCK WAVE LITHOTRIPSY Left 02/06/2019   Procedure: EXTRACORPOREAL SHOCK WAVE LITHOTRIPSY (ESWL);  Surgeon: Kathie Rhodes, MD;  Location: [facility];  Service: Urology;  Laterality: Left;  . NASAL SINUS SURGERY  1985  . ORIF RIGHT  ANKLE  FX  2006  . POLYPECTOMY    . REMOVAL VOCAL CORD CYST  FEB 2014  . RIGHT BREAST BX  08-23-2012  . RIGHT HAND SURGERY  X3  LAST ONE 2009   INCLUDES  ORIF RIGHT 5TH FINGER AND REVISION TWICE  . SKIN CANCER EXCISION    . TONSILLECTOMY AND ADENOIDECTOMY  AGE 21  . TOTAL ABDOMINAL HYSTERECTOMY W/ BILATERAL SALPINGOOPHORECTOMY  1982   W/  APPENDECTOMY  . TRANSTHORACIC ECHOCARDIOGRAM  06-24-2012   GRADE I DIASTOLIC DYSFUNCTION/  EF 55-60%/  MILD MR  . UPPER GASTROINTESTINAL ENDOSCOPY      Family History  Problem Relation Age of Onset  . Rectal cancer Mother   . Colon cancer Mother   . Pancreatic cancer Mother   . Diabetes Mother   . Breast cancer Mother 37  . Breast cancer Maternal Aunt        breast  . Irritable bowel syndrome Son   . Heart disease Son        CAD, MV replacement  . Allergic Disorder Daughter   . Colon cancer Father   . Colon polyps Father   . Diabetes Father   . Stroke Father   . Heart disease Father        CHF  . Hyperlipidemia Father   . Hypertension Father   . Arthritis Father   . Breast cancer Paternal Aunt   . Breast cancer Paternal Aunt   . Arthritis Paternal Uncle   . Allergic Disorder Daughter   . Esophageal cancer Neg Hx   . Stomach cancer Neg Hx     Social History   Socioeconomic History  . Marital status: Married    Spouse name: Jori Moll   . Number of children: 3  . Years of education: BA  . Highest education level: Not on file  Occupational History  . Occupation: Retired Programmer, multimedia: RETIRED  Social Needs  . Financial resource strain: Not on file  . Food insecurity    Worry: Not on file    Inability: Not on file  . Transportation needs    Medical: Not on file    Non-medical: Not on file  Tobacco Use  . Smoking status: Former Smoker    Packs/day: 1.00    Years: 15.00    Pack years: 15.00    Types: Cigarettes    Quit date: 05/27/2005    Years since quitting: 14.1  . Smokeless tobacco: Never Used  Substance and Sexual  Activity  . Alcohol use: No    Alcohol/week: 0.0 standard drinks  . Drug use: No  . Sexual activity: Yes    Partners: Male    Comment: 1st intercourse 73 yo-Fewer than 5 partners  Lifestyle  . Physical activity    Days  per week: Not on file    Minutes per session: Not on file  . Stress: Not on file  Relationships  . Social Herbalist on phone: Not on file    Gets together: Not on file    Attends religious service: Not on file    Active member of club or organization: Not on file    Attends meetings of clubs or organizations: Not on file    Relationship status: Not on file  . Intimate partner violence    Fear of current or ex partner: Not on file    Emotionally abused: Not on file    Physically abused: Not on file    Forced sexual activity: Not on file  Other Topics Concern  . Not on file  Social History Narrative   Lives with husband.   Caffeine use: 1/2 cup per day   Exercise-- 2days a week [employer],  Water aerobics, walking       Retired Marine scientist   No dietary restrictions, tries to maintain a heart healthy diet    Outpatient Medications Prior to Visit  Medication Sig Dispense Refill  . acetaminophen (TYLENOL) 500 MG tablet Take 1,000 mg by mouth every 6 (six) hours as needed for mild pain or headache.     . albuterol (PROVENTIL HFA;VENTOLIN HFA) 108 (90 BASE) MCG/ACT inhaler Inhale 2 puffs into the lungs every 4 (four) hours as needed. 1 Inhaler 5  . amLODipine (NORVASC) 5 MG tablet Take 1 tablet (5 mg total) by mouth daily. (Patient taking differently: Take 5 mg by mouth every evening. ) 90 tablet 3  . aspirin EC 81 MG tablet Take 1 tablet (81 mg total) by mouth daily. 90 tablet 3  . atenolol (TENORMIN) 25 MG tablet TAKE 1 TABLET BY MOUTH THREE TIMES DAILY BEFORE MEAL(S) 270 tablet 0  . denosumab (PROLIA) 60 MG/ML SOSY injection Inject 60 mg into the skin every 6 (six) months.    . DULoxetine (CYMBALTA) 60 MG capsule Take 120 mg by mouth daily.    . Estradiol 10 MCG  TABS vaginal tablet Place 1 tablet (10 mcg total) vaginally 2 (two) times a week. 8 tablet 11  . Evolocumab (REPATHA SURECLICK) 132 MG/ML SOAJ Inject 140 mg into the skin every 14 (fourteen) days. 2 pen 11  . fluticasone (FLONASE) 50 MCG/ACT nasal spray Place 1 spray into the nose as needed for allergies.     . furosemide (LASIX) 20 MG tablet Take 20 mg by mouth as needed for edema.    . gabapentin (NEURONTIN) 300 MG capsule Take 1 capsule (300 mg total) by mouth 2 (two) times daily. 180 capsule 1  . linaclotide (LINZESS) 145 MCG CAPS capsule Take 1 capsule (145 mcg total) by mouth daily before breakfast. 30 capsule 5  . loratadine (CLARITIN) 10 MG tablet Take 1 tablet (10 mg total) by mouth daily. 30 tablet 11  . losartan (COZAAR) 100 MG tablet Take 1 tablet by mouth once daily 90 tablet 1  . mupirocin ointment (BACTROBAN) 2 % Apply thin film twice daily. 22 g 0  . omeprazole (PRILOSEC) 40 MG capsule Take 40 mg by mouth 2 (two) times daily.    . pentosan polysulfate (ELMIRON) 100 MG capsule Take 1 capsule (100 mg total) by mouth 3 (three) times daily. Reported on 01/05/2016 (Patient taking differently: Take 100 mg by mouth 3 (three) times daily with meals as needed. Reported on 01/05/2016) 90 capsule 11  . traMADol (ULTRAM) 50 MG  tablet Take 1-2 tablets (50-100 mg total) by mouth 3 (three) times daily as needed for moderate pain. (Patient taking differently: Take 50-100 mg by mouth 3 (three) times daily as needed for moderate pain (pt uses TID). ) 180 tablet 0  . traZODone (DESYREL) 100 MG tablet Take 1 tablet (100 mg total) by mouth at bedtime. 180 tablet 1   Facility-Administered Medications Prior to Visit  Medication Dose Route Frequency Provider Last Rate Last Dose  . bupivacaine (MARCAINE) 0.5 % 10 mL, triamcinolone acetonide (KENALOG-40) 40 mg injection   Subcutaneous Once Carolan Clines, MD      . bupivacaine (MARCAINE) 0.5 % 15 mL, phenazopyridine (PYRIDIUM) 400 mg bladder mixture    Bladder Instillation Once Carolan Clines, MD        Allergies  Allergen Reactions  . Clindamycin Hcl Shortness Of Breath and Rash  . Penicillins Anaphylaxis  . Prednisone Shortness Of Breath and Rash  . Baclofen Other (See Comments) and Rash  . Cortisone Other (See Comments)  . Lincomycin Other (See Comments)  . Codeine Hives and Other (See Comments)    headache  . Erythromycin Base Other (See Comments)    other  . Fluzone [Flu Virus Vaccine] Other (See Comments)    Local reaction at the site  . Haemophilus Influenzae Other (See Comments)    Local reaction at the site Local reaction at the site  . Latex Hives  . Pentazocine Lactate Other (See Comments)    HALLUCINATION  . Pneumococcal Vaccine Polyvalent Hives, Swelling and Other (See Comments)    REACTION: redness, swelling, and hives at injection site  . Tamoxifen Nausea And Vomiting and Other (See Comments)    HEADACHE    Review of Systems  Constitutional: Negative for fever and malaise/fatigue.  HENT: Negative for congestion.   Eyes: Negative for blurred vision.  Respiratory: Positive for shortness of breath.   Cardiovascular: Positive for chest pain. Negative for palpitations and leg swelling.  Gastrointestinal: Negative for abdominal pain, blood in stool and nausea.  Genitourinary: Negative for dysuria and frequency.  Musculoskeletal: Negative for falls.  Skin: Negative for rash.  Neurological: Positive for headaches. Negative for dizziness and loss of consciousness.  Endo/Heme/Allergies: Negative for environmental allergies.  Psychiatric/Behavioral: Negative for depression. The patient is not nervous/anxious.        Objective:    Physical Exam Constitutional:      Appearance: Normal appearance. She is not ill-appearing.  HENT:     Head: Normocephalic and atraumatic.     Nose: Nose normal.  Eyes:     General:        Right eye: No discharge.        Left eye: No discharge.  Pulmonary:     Effort:  Pulmonary effort is normal.  Neurological:     Mental Status: She is alert and oriented to person, place, and time.  Psychiatric:        Mood and Affect: Mood normal.        Behavior: Behavior normal.     BP 120/72   Pulse 70  Wt Readings from Last 3 Encounters:  03/10/19 134 lb 6.4 oz (61 kg)  02/25/19 133 lb 12.5 oz (60.7 kg)  02/06/19 131 lb 6 oz (59.6 kg)    Diabetic Foot Exam - Simple   No data filed     Lab Results  Component Value Date   WBC 5.9 09/10/2017   HGB 12.2 09/10/2017   HCT 38.0 09/10/2017   PLT 286  09/10/2017   GLUCOSE 79 09/03/2018   CHOL 156 09/03/2018   TRIG 95 09/03/2018   HDL 52 09/03/2018   LDLDIRECT 117.3 04/08/2012   LDLCALC 85 09/03/2018   ALT 12 09/03/2018   AST 19 09/03/2018   NA 139 09/03/2018   K 4.6 09/03/2018   CL 102 09/03/2018   CREATININE 0.98 09/03/2018   BUN 15 09/03/2018   CO2 25 09/03/2018   TSH 1.920 09/10/2017   INR 1.05 08/03/2010   HGBA1C 5.8 03/01/2015   MICROALBUR 0.2 07/10/2013    Lab Results  Component Value Date   TSH 1.920 09/10/2017   Lab Results  Component Value Date   WBC 5.9 09/10/2017   HGB 12.2 09/10/2017   HCT 38.0 09/10/2017   MCV 94 09/10/2017   PLT 286 09/10/2017   Lab Results  Component Value Date   NA 139 09/03/2018   K 4.6 09/03/2018   CHLORIDE 102 04/20/2015   CO2 25 09/03/2018   GLUCOSE 79 09/03/2018   BUN 15 09/03/2018   CREATININE 0.98 09/03/2018   BILITOT 0.3 09/03/2018   ALKPHOS 101 09/03/2018   AST 19 09/03/2018   ALT 12 09/03/2018   PROT 6.4 09/03/2018   ALBUMIN 4.0 09/03/2018   CALCIUM 8.9 09/03/2018   ANIONGAP 12 (H) 04/20/2015   EGFR 49 (L) 04/20/2015   GFR 51.89 (L) 06/28/2017   Lab Results  Component Value Date   CHOL 156 09/03/2018   Lab Results  Component Value Date   HDL 52 09/03/2018   Lab Results  Component Value Date   LDLCALC 85 09/03/2018   Lab Results  Component Value Date   TRIG 95 09/03/2018   Lab Results  Component Value Date    CHOLHDL 3.0 09/03/2018   Lab Results  Component Value Date   HGBA1C 5.8 03/01/2015       Assessment & Plan:   Problem List Items Addressed This Visit    Essential hypertension    Well controlled off of Amlodinpine will . Encouraged heart healthy diet such as the DASH diet and exercise as tolerated. Will restart at 2.5 mg if blood pressure increases again.       Relevant Medications   nitroGLYCERIN (NITROSTAT) 0.4 MG SL tablet   Headache    Was noting headache after taking Amlodipine so she stopped it temporarily and headaches improved.       Dyspnea    With some chest tightness at times. Given NTG to use prn and echo and cxr are ordered today. She will report any worsening of symptoms.       Kidney stone    Was noting headache after taking Amlodipine so she stopped it temporarily and headaches improved.        Other Visit Diagnoses    SOB (shortness of breath)    -  Primary   Relevant Orders   ECHOCARDIOGRAM COMPLETE   DG Chest 2 View      I am having [name] start on nitroGLYCERIN. I am also having her maintain her fluticasone, acetaminophen, aspirin EC, omeprazole, traZODone, albuterol, loratadine, traMADol, pentosan polysulfate, amLODipine, furosemide, denosumab, Estradiol, DULoxetine, linaclotide, mupirocin ointment, gabapentin, Evolocumab, atenolol, and losartan.  Meds ordered this encounter  Medications  . nitroGLYCERIN (NITROSTAT) 0.4 MG SL tablet    Sig: Place 1 tablet (0.4 mg total) under the tongue every 5 (five) minutes as needed for chest pain.    Dispense:  25 tablet    Refill:  1  I discussed the assessment and treatment plan with the patient. The patient was provided an opportunity to ask questions and all were answered. The patient agreed with the plan and demonstrated an understanding of the instructions.   The patient was advised to call back or seek an in-person evaluation if the symptoms worsen or if the condition fails to improve as  anticipated.  I provided 25 minutes of non-face-to-face time during this encounter.   [name], MD

## 2019-07-08 DIAGNOSIS — H52223 Regular astigmatism, bilateral: Secondary | ICD-10-CM | POA: Diagnosis not present

## 2019-07-09 ENCOUNTER — Telehealth: Payer: Self-pay | Admitting: Oncology

## 2019-07-09 NOTE — Addendum Note (Signed)
Addended by: [name] A on: 07/09/2019 03:39 PM   Modules accepted: Orders

## 2019-07-09 NOTE — Telephone Encounter (Signed)
Patient returned phone call regarding voicemail that was left, patient agreed to do telephone visit. Labs have been cancelled.

## 2019-07-10 ENCOUNTER — Inpatient Hospital Stay: Payer: Medicare Other

## 2019-07-10 ENCOUNTER — Inpatient Hospital Stay: Payer: Medicare Other | Attending: Oncology | Admitting: Oncology

## 2019-07-10 DIAGNOSIS — Z170 Estrogen receptor positive status [ER+]: Secondary | ICD-10-CM

## 2019-07-10 DIAGNOSIS — Z923 Personal history of irradiation: Secondary | ICD-10-CM | POA: Diagnosis not present

## 2019-07-10 DIAGNOSIS — C50511 Malignant neoplasm of lower-outer quadrant of right female breast: Secondary | ICD-10-CM

## 2019-07-10 DIAGNOSIS — Z87891 Personal history of nicotine dependence: Secondary | ICD-10-CM | POA: Diagnosis not present

## 2019-07-10 NOTE — Progress Notes (Signed)
ID: [name]   DOB: February 20, 1946  MR#: [id number]  [id number]#:[id number]  Patient Care Team: Mosie Lukes, MD as PCP - General (Family Medicine) Druscilla Brownie, MD as Consulting Physician (Dermatology) Collene Gobble, MD as Consulting Physician (Pulmonary Disease) Magrinat, Virgie Dad, MD as Consulting Physician (Oncology) Stanford Breed Denice Bors, MD as Consulting Physician (Cardiology) Armbruster, Carlota Raspberry, MD as Consulting Physician (Gastroenterology) Suella Broad, MD as Consulting Physician (Physical Medicine and Rehabilitation) Fontaine, Belinda Block, MD as Consulting Physician (Gynecology) Jerrell Belfast, MD as Consulting Physician (Otolaryngology) Paralee Cancel, MD as Consulting Physician (Orthopedic Surgery) Kathie Rhodes, MD as Consulting Physician (Urology) OTHER MD:  I connected with [name] on 07/10/19 at  1:30 PM EDT by telephone visit and verified that I am speaking with the correct person using two identifiers.   I discussed the limitations, risks, security and privacy concerns of performing an evaluation and management service by telemedicine and the availability of in-person appointments. I also discussed with the patient that there may be a patient responsible charge related to this service. The patient expressed understanding and agreed to proceed.   Other persons participating in the visit and their role in the encounter: Wilburn Mylar, scribe   Patient's location: home  Provider's location: [place]: noninvasive breast cancer  CURRENT TREATMENT: observation   INTERVAL HISTORY: [name] returns today for follow-up and treatment of her noninvasive estrogen receptor positive breast cancer. She continues under observation.   Since her last visit, she underwent bilateral diagnostic mammography with tomography and right breast ultrasound at [facility] on 12/30/2018 showing: breast density category C; no evidence of malignancy in  either breast.  She receives prolia through her gynecologist's office. Her most recent dose was on 05/07/2019 and is scheduled for another on 11/08/2019.  She also underwent bone density testing on 01/29/2019. This showed a T-score of -2.2, which is considered osteopenic. This is also improved from -2.6 on her last scan on 12/11/2016.  She is followed for left-sided nephrolithiasis under Dr. Karsten Ro. She underwent extracorporeal shock wave lithotripsy on 02/06/2019.  She fell on 03/07/2019 and underwent x-rays on 03/10/2019. X-rays of the thoracic spine, left ribs, and left elbow were all negative.   REVIEW OF SYSTEMS: Oddie reports she mainly just stays home. She walks about a mile to 1.5 miles every day. A detailed review of systems was otherwise entirely negative.   HISTORY OF PRESENT ILLNESS: From the original intake note:  Deane noted a minimal discharge from the right breast early and 2013. She did not think much of this until it became more copious him a and then she brought it to Dr. Ashley Murrain attention and bilateral breast MRI 07/29/2012 showed 2 areas of concern in the right breast. Right breast biopsy x3 was obtained 08/23/2012, and showed ([id number]) atypical ductal hyperplasia partially involving a papillary lesion. Accordingly, on 10/11/2012 the patient underwent right lumpectomy for what proved to be ([id number] ductal carcinoma in situ, low-grade, measuring approximately 3.2 cm, with negative margins (0.15 cm), 100% estrogen receptor positive, 61% progesterone receptor positive.  With his results the patient was referred to radiation oncology and she is currently receiving of radiation treatments under Dr. Isidore Moos, scheduled to be completed mid December 2013.  The patient's subsequent history is as detailed below.   PAST MEDICAL HISTORY: Past Medical History:  Diagnosis Date  . Allergy   . Anemia   . Anxiety    past hx   . Arthritis   .  Asthma   . Atrial flutter ([place])     past history- not current  . CAD (coronary artery disease) CARDIOLOGIST--  DR Angelena Form   mild non-obstructive cad  . Cancer ([place])    right  . Cataract    bilaterally removed   . Chronic constipation   . Chronic kidney disease    interstitial cystitis  . COPD (chronic obstructive pulmonary disease) ([place])   . Depression    past hx   . Family history of malignant hyperthermia    father had this  . Fibromyalgia   . Frequency of urination   . GERD (gastroesophageal reflux disease)   . H/O hiatal hernia   . History of basal cell carcinoma excision    X2  . History of breast cancer ONCOLOGIST-- DR Jana Hakim---  NO RECURRANCE   DX 07/2012;  LOW GRADE DCIS  ER+PR+  ----  S/P RIGHT LUMPECTOMY WITH NEGATIVE MARGINS/   RADIATION ENDED 11/2012  . History of chronic bronchitis   . History of colonic polyps   . History of tachycardia    CONTROLLED  WITH ATENOLOL  . Hyperlipidemia   . Hypertension   . Neuromuscular disorder (H[facility])    fibromyalgia  . Osteoporosis 01/2019   T score -2.2 stable/improved from prior study  . Pelvic pain   . Personal history of radiation therapy   . S/P radiation therapy 11/12/12 - 12/05/12   right Breast  . Sepsis ([place]) 2014   from UTI   . Sinus headache   . Urgency of urination     PAST SURGICAL HISTORY: Past Surgical History:  Procedure Laterality Date  . [place] STUDY N/A 04/03/2016   Procedure: [place] STUDY;  Surgeon: Manus Gunning, MD;  Location: [facility];  Service: Gastroenterology;  Laterality: N/A;  . BREAST LUMPECTOMY Right 10-11-2012   W/ SLN BX  . CARDIAC CATHETERIZATION  09-13-2007  DR Lia Foyer   WELL-PRESERVED LVF/  DIFFUSE SCATTERED CORONARY CALCIFACATION AND ATHEROSCLEROSIS WITHOUT OBSTRUCTION  . CARDIAC CATHETERIZATION  08-04-2010  DR Angelena Form   NON-OBSTRUCTIVE CAD/  pLAD 40%/  oLAD 30%/  mLAD 30%/  pRCA 30%/  EF 60%  . CARDIOVASCULAR STRESS TEST  06-18-2012  DR McALHANY   LOW RISK NUCLEAR STUDY/  SMALL FIXED AREA  OF MODERATELY DECREASED UPTAKE IN ANTEROSEPTAL WALL WHICH MAY BE ARTIFACTUAL/  NO ISCHEMIA/  EF 68%  . COLONOSCOPY  09-29-2010  . CYSTOSCOPY    . CYSTOSCOPY WITH HYDRODISTENSION AND BIOPSY N/A 03/06/2014   Procedure: CYSTOSCOPY/HYDRODISTENSION/ INSTILATION OF MARCAINE AND PYRIDIUM;  Surgeon: Ailene Rud, MD;  Location: [facility];  Service: Urology;  Laterality: N/A;  . ESOPHAGEAL MANOMETRY N/A 04/03/2016   Procedure: ESOPHAGEAL MANOMETRY (EM);  Surgeon: Manus Gunning, MD;  Location: [facility];  Service: Gastroenterology;  Laterality: N/A;  . EXTRACORPOREAL SHOCK WAVE LITHOTRIPSY Left 02/06/2019   Procedure: EXTRACORPOREAL SHOCK WAVE LITHOTRIPSY (ESWL);  Surgeon: Kathie Rhodes, MD;  Location: [facility];  Service: Urology;  Laterality: Left;  . NASAL SINUS SURGERY  1985  . ORIF RIGHT ANKLE  FX  2006  . POLYPECTOMY    . REMOVAL VOCAL CORD CYST  FEB 2014  . RIGHT BREAST BX  08-23-2012  . RIGHT HAND SURGERY  X3  LAST ONE 2009   INCLUDES  ORIF RIGHT 5TH FINGER AND REVISION TWICE  . SKIN CANCER EXCISION    . TONSILLECTOMY AND ADENOIDECTOMY  AGE 1  . TOTAL ABDOMINAL HYSTERECTOMY W/ BILATERAL SALPINGOOPHORECTOMY  1982  W/  APPENDECTOMY  . TRANSTHORACIC ECHOCARDIOGRAM  06-24-2012   GRADE I DIASTOLIC DYSFUNCTION/  EF 55-60%/  MILD MR  . UPPER GASTROINTESTINAL ENDOSCOPY      FAMILY HISTORY Family History  Problem Relation Age of Onset  . Rectal cancer Mother   . Colon cancer Mother   . Pancreatic cancer Mother   . Diabetes Mother   . Breast cancer Mother 58  . Breast cancer Maternal Aunt        breast  . Irritable bowel syndrome Son   . Heart disease Son        CAD, MV replacement  . Allergic Disorder Daughter   . Colon cancer Father   . Colon polyps Father   . Diabetes Father   . Stroke Father   . Heart disease Father        CHF  . Hyperlipidemia Father   . Hypertension Father   . Arthritis Father   . Breast cancer Paternal Aunt   . Breast cancer  Paternal Aunt   . Arthritis Paternal Uncle   . Allergic Disorder Daughter   . Esophageal cancer Neg Hx   . Stomach cancer Neg Hx     GYNECOLOGIC HISTORY: Menarche age 19, first live birth age 23, TAH/BSO in 61. She was on hormone replacement between 1982 and 2013, at the time of her breast cancer diagnosis.  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 Her husband [name] used to be Insurance underwriter for [employer], now is retired and has helped manage a friend's car lot, something he sees as an extension of his ministry. Their children are Minda Ditto, who is an Chief Financial Officer with [employer] in [place]; Skip Estimable, who works as an Radio producer for [employer] and lives in [place]; and [place], who works as a Radio producer (fourth-grade) in [place].   ADVANCED DIRECTIVES: In place  HEALTH MAINTENANCE: Social History   Tobacco Use  . Smoking status: Former Smoker    Packs/day: 1.00    Years: 15.00    Pack years: 15.00    Types: Cigarettes    Quit date: 05/27/2005    Years since quitting: 14.1  . Smokeless tobacco: Never Used  Substance Use Topics  . Alcohol use: No    Alcohol/week: 0.0 standard drinks  . Drug use: No     Colonoscopy: 12/24/2018, Armbruster  PAP: JAN 2013 ([place])  Bone density: 04/17/2011/ osteoporosis  Lipid panel:  Allergies  Allergen Reactions  . Clindamycin Hcl Shortness Of Breath and Rash  . Penicillins Anaphylaxis  . Prednisone Shortness Of Breath and Rash  . Baclofen Other (See Comments) and Rash  . Cortisone Other (See Comments)  . Lincomycin Other (See Comments)  . Codeine Hives and Other (See Comments)    headache  . Erythromycin Base Other (See Comments)    other  . Fluzone [Flu Virus Vaccine] Other (See Comments)    Local reaction at the site  . Haemophilus Influenzae Other (See Comments)    Local reaction at the site Local reaction  at the site  . Latex Hives  . Pentazocine Lactate Other (See Comments)    HALLUCINATION  . Pneumococcal Vaccine Polyvalent Hives, Swelling and Other (See Comments)    REACTION: redness, swelling, and hives at injection site  . Tamoxifen Nausea And Vomiting and Other (See Comments)    HEADACHE    Current Outpatient Medications  Medication  Sig Dispense Refill  . acetaminophen (TYLENOL) 500 MG tablet Take 1,000 mg by mouth every 6 (six) hours as needed for mild pain or headache.     . albuterol (PROVENTIL HFA;VENTOLIN HFA) 108 (90 BASE) MCG/ACT inhaler Inhale 2 puffs into the lungs every 4 (four) hours as needed. 1 Inhaler 5  . amLODipine (NORVASC) 5 MG tablet Take 1 tablet (5 mg total) by mouth daily. (Patient taking differently: Take 5 mg by mouth every evening. ) 90 tablet 3  . aspirin EC 81 MG tablet Take 1 tablet (81 mg total) by mouth daily. 90 tablet 3  . atenolol (TENORMIN) 25 MG tablet TAKE 1 TABLET BY MOUTH THREE TIMES DAILY BEFORE MEAL(S) 270 tablet 0  . denosumab (PROLIA) 60 MG/ML SOSY injection Inject 60 mg into the skin every 6 (six) months.    . DULoxetine (CYMBALTA) 60 MG capsule Take 120 mg by mouth daily.    . Estradiol 10 MCG TABS vaginal tablet Place 1 tablet (10 mcg total) vaginally 2 (two) times a week. 8 tablet 11  . Evolocumab (REPATHA SURECLICK) 989 MG/ML SOAJ Inject 140 mg into the skin every 14 (fourteen) days. 2 pen 11  . fluticasone (FLONASE) 50 MCG/ACT nasal spray Place 1 spray into the nose as needed for allergies.     . furosemide (LASIX) 20 MG tablet Take 20 mg by mouth as needed for edema.    . gabapentin (NEURONTIN) 300 MG capsule Take 1 capsule (300 mg total) by mouth 2 (two) times daily. 180 capsule 1  . linaclotide (LINZESS) 145 MCG CAPS capsule Take 1 capsule (145 mcg total) by mouth daily before breakfast. 30 capsule 5  . loratadine (CLARITIN) 10 MG tablet Take 1 tablet (10 mg total) by mouth daily. 30 tablet 11  . losartan (COZAAR) 100 MG tablet Take 1  tablet by mouth once daily 90 tablet 1  . mupirocin ointment (BACTROBAN) 2 % Apply thin film twice daily. 22 g 0  . nitroGLYCERIN (NITROSTAT) 0.4 MG SL tablet Place 1 tablet (0.4 mg total) under the tongue every 5 (five) minutes as needed for chest pain. 25 tablet 1  . omeprazole (PRILOSEC) 40 MG capsule Take 40 mg by mouth 2 (two) times daily.    . pentosan polysulfate (ELMIRON) 100 MG capsule Take 1 capsule (100 mg total) by mouth 3 (three) times daily. Reported on 01/05/2016 (Patient taking differently: Take 100 mg by mouth 3 (three) times daily with meals as needed. Reported on 01/05/2016) 90 capsule 11  . traMADol (ULTRAM) 50 MG tablet Take 1-2 tablets (50-100 mg total) by mouth 3 (three) times daily as needed for moderate pain. (Patient taking differently: Take 50-100 mg by mouth 3 (three) times daily as needed for moderate pain (pt uses TID). ) 180 tablet 0  . traZODone (DESYREL) 100 MG tablet Take 1 tablet (100 mg total) by mouth at bedtime. 180 tablet 1   No current facility-administered medications for this visit.    Facility-Administered Medications Ordered in Other Visits  Medication Dose Route Frequency Provider Last Rate Last Dose  . bupivacaine (MARCAINE) 0.5 % 10 mL, triamcinolone acetonide (KENALOG-40) 40 mg injection   Subcutaneous Once Carolan Clines, MD      . bupivacaine (MARCAINE) 0.5 % 15 mL, phenazopyridine (PYRIDIUM) 400 mg bladder mixture   Bladder Instillation Once Carolan Clines, MD        OBJECTIVE: middle-aged white woman   There were no vitals filed for this visit.   There is no height  or weight on file to calculate BMI.    ECOG FS: 1   LAB RESULTS: Lab Results  Component Value Date   WBC 5.9 09/10/2017   NEUTROABS 3.0 04/20/2015   HGB 12.2 09/10/2017   HCT 38.0 09/10/2017   MCV 94 09/10/2017   PLT 286 09/10/2017      Chemistry      Component Value Date/Time   NA 139 09/03/2018 1114   NA 140 04/20/2015 1050   K 4.6 09/03/2018 1114   K 3.9  04/20/2015 1050   CL 102 09/03/2018 1114   CL 103 11/12/2012 1549   CO2 25 09/03/2018 1114   CO2 26 04/20/2015 1050   BUN 15 09/03/2018 1114   BUN 13.1 04/20/2015 1050   CREATININE 0.98 09/03/2018 1114   CREATININE 1.1 04/20/2015 1050      Component Value Date/Time   CALCIUM 8.9 09/03/2018 1114   CALCIUM 8.9 04/20/2015 1050   ALKPHOS 101 09/03/2018 1114   ALKPHOS 87 04/20/2015 1050   AST 19 09/03/2018 1114   AST 23 04/20/2015 1050   ALT 12 09/03/2018 1114   ALT 16 04/20/2015 1050   BILITOT 0.3 09/03/2018 1114   BILITOT 0.26 04/20/2015 1050       No results found for: LABCA2  No components found for: LABCA125  No results for input(s): INR in the last 168 hours.  Urinalysis    Component Value Date/Time   COLORURINE YELLOW 02/25/2019 1209   APPEARANCEUR CLEAR 02/25/2019 1209   LABSPEC 1.020 02/25/2019 1209   PHURINE 6.5 02/25/2019 1209   GLUCOSEU NEGATIVE 02/25/2019 1209   HGBUR NEGATIVE 02/25/2019 1209   BILIRUBINUR NEGATIVE 02/25/2019 1209   BILIRUBINUR Neg 04/30/2015 1153   KETONESUR TRACE (A) 02/25/2019 1209   PROTEINUR NEGATIVE 05/29/2017 1629   UROBILINOGEN 1.0 02/25/2019 1209   NITRITE NEGATIVE 02/25/2019 1209   LEUKOCYTESUR NEGATIVE 02/25/2019 1209    STUDIES: No results found.   ASSESSMENT: 72 y.o. Jule Ser woman s/p Right lumpectomy 10/11/2012 for low-grade ductal carcinoma in situ, estrogen and progesterone receptor positive, with negative though close margins  (1) radiation therapy completed 12/05/2012  (2) tamoxifen started at the completion of radiation, tolerated poorly, discontinued 03/10/2013  (3) breast density category D  (a) breast density decreased to category C as of December 2018 mammography  PLAN:  Lorie is now nearly 7 years out from definitive surgery for her noninvasive breast cancer.  There is been no evidence of disease activity.  This is very favorable.  I do not think the shortness of breath she is experiencing is going  to be related to either her breast cancer or its treatment, recall that the breast cancer was on the right side.  I commended her continuing exercise despite the shortness of breath.  Hopefully that issue will be cleared with the upcoming tests  I am going to see her again in March of next year.  I strongly urged her to obtain a COVID vaccine as soon as one becomes available.  She will have her next mammogram next January  She knows to call for any other issues that may develop before that visit.    Magrinat, Virgie Dad, MD  07/10/19 10:38 AM Medical Oncology and Hematology [facility] [address] [place], [place] [address] Tel. [phone]    Fax. [phone]   I, Wilburn Mylar, am acting as scribe for Dr. Virgie Dad. Magrinat.  Lindie Spruce MD, have reviewed the above documentation for accuracy and completeness, and  I agree with the above.

## 2019-07-14 ENCOUNTER — Ambulatory Visit (HOSPITAL_BASED_OUTPATIENT_CLINIC_OR_DEPARTMENT_OTHER)
Admission: RE | Admit: 2019-07-14 | Discharge: 2019-07-14 | Disposition: A | Discharge status: Home / Self Care | Payer: Medicare Other | Source: Ambulatory Visit | Attending: Family Medicine | Admitting: Family Medicine

## 2019-07-14 ENCOUNTER — Other Ambulatory Visit: Payer: Self-pay

## 2019-07-14 ENCOUNTER — Other Ambulatory Visit (INDEPENDENT_AMBULATORY_CARE_PROVIDER_SITE_OTHER): Payer: Medicare Other

## 2019-07-14 DIAGNOSIS — R0602 Shortness of breath: Secondary | ICD-10-CM

## 2019-07-14 DIAGNOSIS — E785 Hyperlipidemia, unspecified: Secondary | ICD-10-CM | POA: Diagnosis not present

## 2019-07-14 DIAGNOSIS — I10 Essential (primary) hypertension: Secondary | ICD-10-CM

## 2019-07-14 DIAGNOSIS — Z7289 Other problems related to lifestyle: Secondary | ICD-10-CM

## 2019-07-14 LAB — CBC
HCT: 38.8 % (ref 36.0–46.0)
Hemoglobin: 12.5 g/dL (ref 12.0–15.0)
MCHC: 32.3 g/dL (ref 30.0–36.0)
MCV: 93.7 fl (ref 78.0–100.0)
Platelets: 296 10*3/uL (ref 150.0–400.0)
RBC: 4.14 Mil/uL (ref 3.87–5.11)
RDW: 16.2 % — ABNORMAL HIGH (ref 11.5–15.5)
WBC: 6.8 10*3/uL (ref 4.0–10.5)

## 2019-07-14 LAB — LIPID PANEL
Cholesterol: 130 mg/dL (ref 0–200)
HDL: 46.8 mg/dL (ref >39.00)
LDL Cholesterol: 54 mg/dL (ref 0–99)
NonHDL: 82.77
Total CHOL/HDL Ratio: 3
Triglycerides: 145 mg/dL (ref 0.0–149.0)
VLDL: 29 mg/dL (ref 0.0–40.0)

## 2019-07-14 LAB — COMPREHENSIVE METABOLIC PANEL
ALT: 10 U/L (ref 0–35)
AST: 15 U/L (ref 0–37)
Albumin: 4 g/dL (ref 3.5–5.2)
Alkaline Phosphatase: 86 U/L (ref 39–117)
BUN: 19 mg/dL (ref 6–23)
CO2: 31 mEq/L (ref 19–32)
Calcium: 8.9 mg/dL (ref 8.4–10.5)
Chloride: 103 mEq/L (ref 96–112)
Creatinine, Ser: 1.03 mg/dL (ref 0.40–1.20)
GFR: 52.38 mL/min — ABNORMAL LOW (ref >60.00)
Glucose, Bld: 87 mg/dL (ref 70–99)
Potassium: 4.7 mEq/L (ref 3.5–5.1)
Sodium: 139 mEq/L (ref 135–145)
Total Bilirubin: 0.3 mg/dL (ref 0.2–1.2)
Total Protein: 6.3 g/dL (ref 6.0–8.3)

## 2019-07-14 LAB — TSH: TSH: 2.37 u[IU]/mL (ref 0.35–4.50)

## 2019-07-14 NOTE — Progress Notes (Signed)
  Echocardiogram 2D Echocardiogram has been performed.   Cardell Peach 07/14/2019, 9:07 AM

## 2019-07-14 NOTE — Addendum Note (Signed)
Addended by: Caffie Pinto on: 07/14/2019 02:56 PM   Modules accepted: Orders

## 2019-07-15 ENCOUNTER — Other Ambulatory Visit: Payer: Medicare Other

## 2019-07-15 ENCOUNTER — Encounter: Payer: Self-pay | Admitting: Gynecology

## 2019-07-15 DIAGNOSIS — Z7289 Other problems related to lifestyle: Secondary | ICD-10-CM

## 2019-07-15 NOTE — Progress Notes (Signed)
HPI: FU CAD.Cardiac catheterization in August 2011 showed a 40% proximal LAD, 30% mid LAD, 30% RCA and her ejection fraction was 60%. Nuclear study in June 2013 showed no ischemia with an ejection fraction of 68%. Nuclear study March 2016 showed ejection fraction 67%. Breast attenuation but no ischemia.MRA of the headOctober 2018 normal.Renal dopplers 1/19 showed no RAS. Patient recently seen by primary care for increasing dyspnea on exertion.  Echocardiogram repeated July 2020 and showed normal LV function.  Chest x-ray July 2020 showed emphysema.  Laboratories July 2020 showed normal hemoglobin, creatinine 1.03 and normal TSH.  Since last seenshe notes increased dyspnea on exertion.  Feels this is related to conditioning secondary to decreased exercise during COVID pandemic.  She also attributes some distress.  She occasionally has chest tightness at night.  He does not have exertional chest pain.  She does describe some fatigue.  Current Outpatient Medications  Medication Sig Dispense Refill  . acetaminophen (TYLENOL) 500 MG tablet Take 1,000 mg by mouth every 6 (six) hours as needed for mild pain or headache.     . albuterol (PROVENTIL HFA;VENTOLIN HFA) 108 (90 BASE) MCG/ACT inhaler Inhale 2 puffs into the lungs every 4 (four) hours as needed. 1 Inhaler 5  . amLODipine (NORVASC) 5 MG tablet Take 1 tablet (5 mg total) by mouth daily. (Patient taking differently: Take 5 mg by mouth every evening. ) 90 tablet 3  . aspirin EC 81 MG tablet Take 1 tablet (81 mg total) by mouth daily. 90 tablet 3  . atenolol (TENORMIN) 25 MG tablet TAKE 1 TABLET BY MOUTH THREE TIMES DAILY BEFORE MEAL(S) 270 tablet 0  . denosumab (PROLIA) 60 MG/ML SOSY injection Inject 60 mg into the skin every 6 (six) months.    . DULoxetine (CYMBALTA) 60 MG capsule Take 120 mg by mouth daily.    . Estradiol 10 MCG TABS vaginal tablet Place 1 tablet (10 mcg total) vaginally 2 (two) times a week. 8 tablet 11  . Evolocumab  (REPATHA SURECLICK) 962 MG/ML SOAJ Inject 140 mg into the skin every 14 (fourteen) days. 2 pen 11  . fluticasone (FLONASE) 50 MCG/ACT nasal spray Place 1 spray into the nose as needed for allergies.     . furosemide (LASIX) 20 MG tablet Take 20 mg by mouth as needed for edema.    . gabapentin (NEURONTIN) 300 MG capsule Take 1 capsule (300 mg total) by mouth 2 (two) times daily. 180 capsule 1  . linaclotide (LINZESS) 145 MCG CAPS capsule Take 1 capsule (145 mcg total) by mouth daily before breakfast. 30 capsule 5  . loratadine (CLARITIN) 10 MG tablet Take 1 tablet (10 mg total) by mouth daily. 30 tablet 11  . losartan (COZAAR) 100 MG tablet Take 1 tablet by mouth once daily 90 tablet 1  . mupirocin ointment (BACTROBAN) 2 % Apply thin film twice daily. 22 g 0  . nitroGLYCERIN (NITROSTAT) 0.4 MG SL tablet Place 1 tablet (0.4 mg total) under the tongue every 5 (five) minutes as needed for chest pain. 25 tablet 1  . omeprazole (PRILOSEC) 40 MG capsule Take 40 mg by mouth 2 (two) times daily.    . pentosan polysulfate (ELMIRON) 100 MG capsule Take 1 capsule (100 mg total) by mouth 3 (three) times daily. Reported on 01/05/2016 (Patient taking differently: Take 100 mg by mouth 3 (three) times daily with meals as needed. Reported on 01/05/2016) 90 capsule 11  . traMADol (ULTRAM) 50 MG tablet Take 1-2  tablets (50-100 mg total) by mouth 3 (three) times daily as needed for moderate pain. (Patient taking differently: Take 50-100 mg by mouth 3 (three) times daily as needed for moderate pain (pt uses TID). ) 180 tablet 0  . traZODone (DESYREL) 100 MG tablet Take 1 tablet (100 mg total) by mouth at bedtime. 180 tablet 1   No current facility-administered medications for this visit.    Facility-Administered Medications Ordered in Other Visits  Medication Dose Route Frequency Provider Last Rate Last Dose  . bupivacaine (MARCAINE) 0.5 % 10 mL, triamcinolone acetonide (KENALOG-40) 40 mg injection   Subcutaneous Once  Carolan Clines, MD      . bupivacaine (MARCAINE) 0.5 % 15 mL, phenazopyridine (PYRIDIUM) 400 mg bladder mixture   Bladder Instillation Once Carolan Clines, MD         Past Medical History:  Diagnosis Date  . Allergy   . Anemia   . Anxiety    past hx   . Arthritis   . Asthma   . Atrial flutter ([place])    past history- not current  . CAD (coronary artery disease) CARDIOLOGIST--  DR Angelena Form   mild non-obstructive cad  . Cancer ([place])    right  . Cataract    bilaterally removed   . Chronic constipation   . Chronic kidney disease    interstitial cystitis  . COPD (chronic obstructive pulmonary disease) ([place])   . Depression    past hx   . Family history of malignant hyperthermia    father had this  . Fibromyalgia   . Frequency of urination   . GERD (gastroesophageal reflux disease)   . H/O hiatal hernia   . History of basal cell carcinoma excision    X2  . History of breast cancer ONCOLOGIST-- DR Jana Hakim---  NO RECURRANCE   DX 07/2012;  LOW GRADE DCIS  ER+PR+  ----  S/P RIGHT LUMPECTOMY WITH NEGATIVE MARGINS/   RADIATION ENDED 11/2012  . History of chronic bronchitis   . History of colonic polyps   . History of tachycardia    CONTROLLED  WITH ATENOLOL  . Hyperlipidemia   . Hypertension   . Neuromuscular disorder ([facility])    fibromyalgia  . Osteoporosis 01/2019   T score -2.2 stable/improved from prior study  . Pelvic pain   . Personal history of radiation therapy   . S/P radiation therapy 11/12/12 - 12/05/12   right Breast  . Sepsis ([place]) 2014   from UTI   . Sinus headache   . Urgency of urination     Past Surgical History:  Procedure Laterality Date  . [place] STUDY N/A 04/03/2016   Procedure: [place] STUDY;  Surgeon: Manus Gunning, MD;  Location: [facility];  Service: Gastroenterology;  Laterality: N/A;  . BREAST LUMPECTOMY Right 10-11-2012   W/ SLN BX  . CARDIAC CATHETERIZATION  09-13-2007  DR Lia Foyer   WELL-PRESERVED LVF/  DIFFUSE  SCATTERED CORONARY CALCIFACATION AND ATHEROSCLEROSIS WITHOUT OBSTRUCTION  . CARDIAC CATHETERIZATION  08-04-2010  DR Angelena Form   NON-OBSTRUCTIVE CAD/  pLAD 40%/  oLAD 30%/  mLAD 30%/  pRCA 30%/  EF 60%  . CARDIOVASCULAR STRESS TEST  06-18-2012  DR McALHANY   LOW RISK NUCLEAR STUDY/  SMALL FIXED AREA OF MODERATELY DECREASED UPTAKE IN ANTEROSEPTAL WALL WHICH MAY BE ARTIFACTUAL/  NO ISCHEMIA/  EF 68%  . COLONOSCOPY  09-29-2010  . CYSTOSCOPY    . CYSTOSCOPY WITH HYDRODISTENSION AND BIOPSY N/A 03/06/2014   Procedure: CYSTOSCOPY/HYDRODISTENSION/  INSTILATION OF MARCAINE AND PYRIDIUM;  Surgeon: Ailene Rud, MD;  Location: [facility];  Service: Urology;  Laterality: N/A;  . ESOPHAGEAL MANOMETRY N/A 04/03/2016   Procedure: ESOPHAGEAL MANOMETRY (EM);  Surgeon: Manus Gunning, MD;  Location: [facility];  Service: Gastroenterology;  Laterality: N/A;  . EXTRACORPOREAL SHOCK WAVE LITHOTRIPSY Left 02/06/2019   Procedure: EXTRACORPOREAL SHOCK WAVE LITHOTRIPSY (ESWL);  Surgeon: Kathie Rhodes, MD;  Location: [facility];  Service: Urology;  Laterality: Left;  . NASAL SINUS SURGERY  1985  . ORIF RIGHT ANKLE  FX  2006  . POLYPECTOMY    . REMOVAL VOCAL CORD CYST  FEB 2014  . RIGHT BREAST BX  08-23-2012  . RIGHT HAND SURGERY  X3  LAST ONE 2009   INCLUDES  ORIF RIGHT 5TH FINGER AND REVISION TWICE  . SKIN CANCER EXCISION    . TONSILLECTOMY AND ADENOIDECTOMY  AGE 34  . TOTAL ABDOMINAL HYSTERECTOMY W/ BILATERAL SALPINGOOPHORECTOMY  1982   W/  APPENDECTOMY  . TRANSTHORACIC ECHOCARDIOGRAM  06-24-2012   GRADE I DIASTOLIC DYSFUNCTION/  EF 55-60%/  MILD MR  . UPPER GASTROINTESTINAL ENDOSCOPY      Social History   Socioeconomic History  . Marital status: Married    Spouse name: Jori Moll   . Number of children: 3  . Years of education: BA  . Highest education level: Not on file  Occupational History  . Occupation: Retired Programmer, multimedia: RETIRED  Social Needs  . Financial resource  strain: Not on file  . Food insecurity    Worry: Not on file    Inability: Not on file  . Transportation needs    Medical: Not on file    Non-medical: Not on file  Tobacco Use  . Smoking status: Former Smoker    Packs/day: 1.00    Years: 15.00    Pack years: 15.00    Types: Cigarettes    Quit date: 05/27/2005    Years since quitting: 14.1  . Smokeless tobacco: Never Used  Substance and Sexual Activity  . Alcohol use: No    Alcohol/week: 0.0 standard drinks  . Drug use: No  . Sexual activity: Yes    Partners: Male    Comment: 1st intercourse 73 yo-Fewer than 5 partners  Lifestyle  . Physical activity    Days per week: Not on file    Minutes per session: Not on file  . Stress: Not on file  Relationships  . Social Herbalist on phone: Not on file    Gets together: Not on file    Attends religious service: Not on file    Active member of club or organization: Not on file    Attends meetings of clubs or organizations: Not on file    Relationship status: Not on file  . Intimate partner violence    Fear of current or ex partner: Not on file    Emotionally abused: Not on file    Physically abused: Not on file    Forced sexual activity: Not on file  Other Topics Concern  . Not on file  Social History Narrative   Lives with husband.   Caffeine use: 1/2 cup per day   Exercise-- 2days a week [employer],  Water aerobics, walking       Retired Marine scientist   No dietary restrictions, tries to maintain a heart healthy diet    Family History  Problem Relation Age of Onset  . Rectal cancer Mother   .  Colon cancer Mother   . Pancreatic cancer Mother   . Diabetes Mother   . Breast cancer Mother 24  . Breast cancer Maternal Aunt        breast  . Irritable bowel syndrome Son   . Heart disease Son        CAD, MV replacement  . Allergic Disorder Daughter   . Colon cancer Father   . Colon polyps Father   . Diabetes Father   . Stroke Father   . Heart disease Father        CHF   . Hyperlipidemia Father   . Hypertension Father   . Arthritis Father   . Breast cancer Paternal Aunt   . Breast cancer Paternal Aunt   . Arthritis Paternal Uncle   . Allergic Disorder Daughter   . Esophageal cancer Neg Hx   . Stomach cancer Neg Hx     ROS: Back pain, fatigue, headache but no fevers or chills, productive cough, hemoptysis, dysphasia, odynophagia, melena, hematochezia, dysuria, hematuria, rash, seizure activity, orthopnea, PND, pedal edema, claudication. Remaining systems are negative.  Physical Exam: Well-developed well-nourished in no acute distress.  Skin is warm and dry.  HEENT is normal.  Neck is supple.  Chest is clear to auscultation with normal expansion.  Cardiovascular exam is regular rate and rhythm.  Abdominal exam nontender or distended. No masses palpated. Extremities show no edema. neuro grossly intact  ECG-sinus rhythm at a rate of 64, nonspecific ST changes.  Personally reviewed  A/P  1 coronary artery disease-Continue medical therapy with aspirin.  She is intolerant to statins.  She does describe increased dyspnea on exertion but attributes this to deconditioning and anxiety.  Occasional chest heaviness not related to exertion.  Electrocardiogram unchanged.  We can consider repeating functional study in the future if her symptoms worsen.  2 hypertension-patient's blood pressure is controlled.  Continue present medications and follow.    3 hyperlipidemia-patient is intolerant to statins.  Continue Repatha.    4 dyspnea/chest tightness-plan as outlined 1.  Kirk Ruths, MD

## 2019-07-16 LAB — HEPATITIS C ANTIBODY
Hepatitis C Ab: NONREACTIVE
SIGNAL TO CUT-OFF: 0.03 (ref <1.00)

## 2019-07-23 ENCOUNTER — Ambulatory Visit: Payer: Medicare Other | Admitting: Cardiology

## 2019-07-23 ENCOUNTER — Other Ambulatory Visit: Payer: Self-pay

## 2019-07-23 ENCOUNTER — Encounter: Payer: Self-pay | Admitting: Cardiology

## 2019-07-23 VITALS — BP 120/76 | HR 64 | Ht 65.0 in | Wt 138.0 lb

## 2019-07-23 DIAGNOSIS — I10 Essential (primary) hypertension: Secondary | ICD-10-CM

## 2019-07-23 DIAGNOSIS — E785 Hyperlipidemia, unspecified: Secondary | ICD-10-CM

## 2019-07-23 DIAGNOSIS — I2510 Atherosclerotic heart disease of native coronary artery without angina pectoris: Secondary | ICD-10-CM | POA: Diagnosis not present

## 2019-07-23 DIAGNOSIS — R0602 Shortness of breath: Secondary | ICD-10-CM

## 2019-07-23 MED ORDER — FUROSEMIDE 20 MG PO TABS: 20.0000 mg | ORAL_TABLET | ORAL | 3 refills | Status: DC | PRN

## 2019-07-23 NOTE — Patient Instructions (Signed)
Medication Instructions:  NO CHANGE If you need a refill on your cardiac medications before your next appointment, please call your pharmacy.   Lab work: If you have labs (blood work) drawn today and your tests are completely normal, you will receive your results only by: Marland Kitchen MyChart Message (if you have MyChart) OR . A paper copy in the mail If you have any lab test that is abnormal or we need to change your treatment, we will call you to review the results.  Follow-Up: At [facility], you and your health needs are our priority.  As part of our continuing mission to provide you with exceptional heart care, we have created designated Provider Care Teams.  These Care Teams include your primary Cardiologist (physician) and Advanced Practice Providers (APPs -  Physician Assistants and Nurse Practitioners) who all work together to provide you with the care you need, when you need it. . Your physician recommends that you schedule a follow-up appointment in: [name]

## 2019-07-24 ENCOUNTER — Other Ambulatory Visit: Payer: Self-pay | Admitting: *Deleted

## 2019-07-24 MED ORDER — FLUTICASONE PROPIONATE 50 MCG/ACT NA SUSP: 2.0000 | Freq: Every day | NASAL | 5 refills | Status: DC

## 2019-07-24 MED ORDER — TRAZODONE HCL 100 MG PO TABS: 100.0000 mg | ORAL_TABLET | Freq: Every day | ORAL | 1 refills | Status: DC

## 2019-07-24 MED ORDER — OMEPRAZOLE 40 MG PO CPDR: 40.0000 mg | DELAYED_RELEASE_CAPSULE | Freq: Two times a day (BID) | ORAL | 1 refills | Status: DC

## 2019-07-24 NOTE — Telephone Encounter (Signed)
[facility] sent over refills for trazodone and flonase.  The flonase was last filled by ENT doc and trazadone was last filled by a doc in [place] Dr Elenor Quinones.  Would you like to refill these.  It looks likes there are some flags when we refilled.  Please check on this.

## 2019-08-04 ENCOUNTER — Other Ambulatory Visit: Payer: Self-pay | Admitting: Cardiology

## 2019-08-21 ENCOUNTER — Other Ambulatory Visit: Payer: Self-pay | Admitting: *Deleted

## 2019-08-21 ENCOUNTER — Telehealth: Payer: Self-pay | Admitting: Pharmacist Clinician (PhC)/ Clinical Pharmacy Specialist

## 2019-08-21 MED ORDER — DULOXETINE HCL 60 MG PO CPEP: 120.0000 mg | ORAL_CAPSULE | Freq: Every day | ORAL | 1 refills | Status: DC

## 2019-08-21 MED ORDER — REPATHA SURECLICK 140 MG/ML ~~LOC~~ SOAJ: 140.0000 mg | SUBCUTANEOUS | 11 refills | Status: DC

## 2019-08-21 NOTE — Telephone Encounter (Signed)
Patient in donut hole for medications.  Cost of Repatha went from $36 to $140/month.  States makes her budget a little too tight.  She is willing to pay every other month if we have samples available  Explained that we currently have samples and will leave 2 for her to pick up.  She will then pay for October.

## 2019-08-22 DIAGNOSIS — M5136 Other intervertebral disc degeneration, lumbar region: Secondary | ICD-10-CM | POA: Diagnosis not present

## 2019-08-22 DIAGNOSIS — Z79891 Long term (current) use of opiate analgesic: Secondary | ICD-10-CM | POA: Diagnosis not present

## 2019-08-28 ENCOUNTER — Encounter: Payer: Self-pay | Admitting: Physical Therapy

## 2019-08-28 ENCOUNTER — Ambulatory Visit (INDEPENDENT_AMBULATORY_CARE_PROVIDER_SITE_OTHER): Payer: Medicare Other | Admitting: Physical Therapy

## 2019-08-28 ENCOUNTER — Other Ambulatory Visit: Payer: Self-pay

## 2019-08-28 DIAGNOSIS — M5441 Lumbago with sciatica, right side: Secondary | ICD-10-CM

## 2019-08-28 DIAGNOSIS — M6281 Muscle weakness (generalized): Secondary | ICD-10-CM | POA: Diagnosis not present

## 2019-08-28 DIAGNOSIS — R2689 Other abnormalities of gait and mobility: Secondary | ICD-10-CM | POA: Diagnosis not present

## 2019-08-28 DIAGNOSIS — M25561 Pain in right knee: Secondary | ICD-10-CM | POA: Diagnosis not present

## 2019-08-28 DIAGNOSIS — M5442 Lumbago with sciatica, left side: Secondary | ICD-10-CM

## 2019-08-28 DIAGNOSIS — G8929 Other chronic pain: Secondary | ICD-10-CM

## 2019-08-28 NOTE — Therapy (Signed)
[place], [place], [address] Phone: [phone]   Fax:  [phone]  Physical Therapy Evaluation  Patient Details  Name: [name] MRN: [id number] Date of Birth: 06/30/1946 Referring Provider (PT): Levy Pupa, [place]   Encounter Date: 08/28/2019  PT End of Session - 08/28/19 1258    Visit Number  1    Number of Visits  12    Date for PT Re-Evaluation  10/09/19    PT Start Time  [id number]    PT Stop Time  1225    PT Time Calculation (min)  43 min    Activity Tolerance  Patient tolerated treatment well    Behavior During Therapy  [facility] for tasks assessed/performed       Past Medical History:  Diagnosis Date  . Allergy   . Anemia   . Anxiety    past hx   . Arthritis   . Asthma   . Atrial flutter ([place])    past history- not current  . CAD (coronary artery disease) CARDIOLOGIST--  DR Angelena Form   mild non-obstructive cad  . Cancer ([place])    right  . Cataract    bilaterally removed   . Chronic constipation   . Chronic kidney disease    interstitial cystitis  . COPD (chronic obstructive pulmonary disease) ([place])   . Depression    past hx   . Family history of malignant hyperthermia    father had this  . Fibromyalgia   . Frequency of urination   . GERD (gastroesophageal reflux disease)   . H/O hiatal hernia   . History of basal cell carcinoma excision    X2  . History of breast cancer ONCOLOGIST-- DR Jana Hakim---  NO RECURRANCE   DX 07/2012;  LOW GRADE DCIS  ER+PR+  ----  S/P RIGHT LUMPECTOMY WITH NEGATIVE MARGINS/   RADIATION ENDED 11/2012  . History of chronic bronchitis   . History of colonic polyps   . History of tachycardia    CONTROLLED  WITH ATENOLOL  . Hyperlipidemia   . Hypertension   . Neuromuscular disorder (HCC)    fibromyalgia  . Osteoporosis 01/2019   T score -2.2 stable/improved from prior study  . Pelvic pain   . Personal history of radiation therapy   . S/P radiation  therapy 11/12/12 - 12/05/12   right Breast  . Sepsis ([place]) 2014   from UTI   . Sinus headache   . Urgency of urination     Past Surgical History:  Procedure Laterality Date  . [place] STUDY N/A 04/03/2016   Procedure: [place] STUDY;  Surgeon: Manus Gunning, MD;  Location: [facility];  Service: Gastroenterology;  Laterality: N/A;  . BREAST LUMPECTOMY Right 10-11-2012   W/ SLN BX  . CARDIAC CATHETERIZATION  09-13-2007  DR Lia Foyer   WELL-PRESERVED LVF/  DIFFUSE SCATTERED CORONARY CALCIFACATION AND ATHEROSCLEROSIS WITHOUT OBSTRUCTION  . CARDIAC CATHETERIZATION  08-04-2010  DR Angelena Form   NON-OBSTRUCTIVE CAD/  pLAD 40%/  oLAD 30%/  mLAD 30%/  pRCA 30%/  EF 60%  . CARDIOVASCULAR STRESS TEST  06-18-2012  DR McALHANY   LOW RISK NUCLEAR STUDY/  SMALL FIXED AREA OF MODERATELY DECREASED UPTAKE IN ANTEROSEPTAL WALL WHICH MAY BE ARTIFACTUAL/  NO ISCHEMIA/  EF 68%  . COLONOSCOPY  09-29-2010  . CYSTOSCOPY    . CYSTOSCOPY WITH HYDRODISTENSION AND BIOPSY N/A 03/06/2014   Procedure: CYSTOSCOPY/HYDRODISTENSION/ INSTILATION OF MARCAINE AND PYRIDIUM;  Surgeon: Shane Crutch  Gaynelle Arabian, MD;  Location: [facility];  Service: Urology;  Laterality: N/A;  . ESOPHAGEAL MANOMETRY N/A 04/03/2016   Procedure: ESOPHAGEAL MANOMETRY (EM);  Surgeon: Manus Gunning, MD;  Location: [facility];  Service: Gastroenterology;  Laterality: N/A;  . EXTRACORPOREAL SHOCK WAVE LITHOTRIPSY Left 02/06/2019   Procedure: EXTRACORPOREAL SHOCK WAVE LITHOTRIPSY (ESWL);  Surgeon: Kathie Rhodes, MD;  Location: [facility];  Service: Urology;  Laterality: Left;  . NASAL SINUS SURGERY  1985  . ORIF RIGHT ANKLE  FX  2006  . POLYPECTOMY    . REMOVAL VOCAL CORD CYST  FEB 2014  . RIGHT BREAST BX  08-23-2012  . RIGHT HAND SURGERY  X3  LAST ONE 2009   INCLUDES  ORIF RIGHT 5TH FINGER AND REVISION TWICE  . SKIN CANCER EXCISION    . TONSILLECTOMY AND ADENOIDECTOMY  AGE 30  . TOTAL ABDOMINAL HYSTERECTOMY W/ BILATERAL  SALPINGOOPHORECTOMY  1982   W/  APPENDECTOMY  . TRANSTHORACIC ECHOCARDIOGRAM  06-24-2012   GRADE I DIASTOLIC DYSFUNCTION/  EF 55-60%/  MILD MR  . UPPER GASTROINTESTINAL ENDOSCOPY      There were no vitals filed for this visit.   Subjective Assessment - 08/28/19 1144    Subjective  Pt is a 73 y/o female who returns to OPPT for LBP and Rt knee pain.  Pt states per MD no other interventions for back can be completed is surgery, besides managing with exercises. Pt reports since stay-at-home orders, she has been home completely and "I've let myself go" due to inability to exercise.  Pt also c/o sharp pain and numbnees into feet and along lateral lower leg.  Pt also with c/o Rt knee pain x 8 months with injection.    Limitations  Walking    How long can you walk comfortably?  15 min    Patient Stated Goals  walk without knee or back hurting, get up off floor by myself    Currently in Pain?  Yes    Pain Score  6    up to 8/10; at best 5/10   Pain Location  Knee    Pain Orientation  Right;Medial;Lateral    Pain Descriptors / Indicators  Aching;Sharp;Shooting;Sore;Dull;Tender    Pain Type  Chronic pain    Pain Onset  More than a month ago    Pain Frequency  Constant    Aggravating Factors   walking    Pain Relieving Factors  elevation, voltaren    Multiple Pain Sites  Yes    Pain Score  4   up to 8/10; at best 2/10   Pain Location  Back    Pain Orientation  Lower    Pain Descriptors / Indicators  Sharp;Squeezing    Pain Radiating Towards  ant thighs to knees    Pain Onset  More than a month ago    Pain Frequency  Constant    Aggravating Factors   sweeping, walking, yardwork    Pain Relieving Factors  lying flat         [facility] PT Assessment - 08/28/19 1154      Assessment   Medical Diagnosis  LBP, R knee pain    Referring Provider (PT)  Levy Pupa, PA    Onset Date/Surgical Date  --   chronic   Hand Dominance  Right    Next MD Visit  3 months    Prior Therapy  At this clinic  ~ 1 year ago      Precautions  Precautions  None      Restrictions   Weight Bearing Restrictions  No      Balance Screen   Has the patient fallen in the past 6 months  No   1 in Jan   Has the patient had a decrease in activity level because of a fear of falling?   Yes    Is the patient reluctant to leave their home because of a fear of falling?   Yes      [place]  Private residence    Living Arrangements  Spouse/significant other    Type of [place] to enter    Entrance Stairs-Number of Steps  1    [place]  Two level;Bed/bath upstairs    Alternate Level Stairs-Number of Steps  15    Alternate Level Stairs-Rails  Right      Prior Function   Level of Independence  Independent    Vocation  Retired    Leisure  play cards, watch movies, Company secretary, tried a Nurse, adult class (too advanced)      Cognition   Overall Cognitive Status  Within Functional Limits for tasks assessed      Observation/Other Assessments   Focus on Therapeutic Outcomes (FOTO)   34 (66% limited; predicted 43% limited)      Posture/Postural Control   Posture/Postural Control  Postural limitations    Postural Limitations  Rounded Shoulders;Forward head;Increased thoracic kyphosis      ROM / Strength   AROM / PROM / Strength  AROM;Strength      AROM   AROM Assessment Site  Lumbar    Lumbar Flexion  limited 25% - worse with repetition    Lumbar Extension  WNL- improved pain    Lumbar - Right Side Bend  WNL    Lumbar - Left Side Bend  WNL    Lumbar - Right Rotation  WNL    Lumbar - Left Rotation  WNL      Strength   Overall Strength Comments  hip abduction and extension weakness due to functional limitations and gait abnormalities    Strength Assessment Site  Hip;Knee;Ankle    Right/Left Hip  Right;Left    Right Hip Flexion  3/5    Left Hip Flexion  3/5    Right/Left Knee  Right;Left    Right Knee Flexion  5/5    Right Knee Extension   5/5    Left Knee Flexion  5/5    Left Knee Extension  5/5    Right/Left Ankle  Right;Left    Right Ankle Dorsiflexion  4/5    Left Ankle Dorsiflexion  4/5      Transfers   Five time sit to stand comments   22.37 sec      Ambulation/Gait   Gait Pattern  Trendelenburg;Lateral hip instability                Objective measurements completed on examination: See above findings.      [facility] Adult PT Treatment/Exercise - 08/28/19 1154      Self-Care   Self-Care  Other Self-Care Comments    Other Self-Care Comments   verbally reviewed prior HEP - pt states she will bring next visit             PT Education - 08/28/19 1258    Education Details  resume prior HEP; return to [employer]) Educated  Patient    Methods  Explanation;Demonstration;Handout    Comprehension  Verbalized understanding;Returned demonstration;Need further instruction          PT Long Term Goals - 08/28/19 1306      PT LONG TERM GOAL #1   Title  independent with HEP    Status  New    Target Date  10/09/19      PT LONG TERM GOAL #2   Title  FOTO score improved to </= 43% limited for improved function    Status  New    Target Date  10/09/19      PT LONG TERM GOAL #3   Title  perform thoracolumbar flexion without increase in pain    Status  New    Target Date  10/09/19      PT LONG TERM GOAL #4   Title  improve 5x STS to </= 17 sec for improved functional strength    Status  New    Target Date  10/09/19      PT LONG TERM GOAL #5   Title  verbalize compliance with community fitness and plans to continue at d/c    Status  New    Target Date  10/09/19             Plan - 08/28/19 1223    Clinical Impression Statement  Pt is a 73 y/o female who presents to OPPT for chronic LBP and Rt knee pain.  Pt has been sedentary since March due to pandemic and is ready to return to regular exercise.  Plan to see to address deficits and return to prior level of function, and resume  regular community fitness.    Personal Factors and Comorbidities  Comorbidity 3+;Past/Current Experience;Time since onset of injury/illness/exacerbation    Comorbidities  osteoporosis, HTN, fibromyalgia, COPD, CAD, asthma    Examination-Activity Limitations  Sit;Stand;Locomotion Level    Examination-Participation Restrictions  Cleaning;Driving;Yard Work    Stability/Clinical Decision Making  Stable/Uncomplicated    Clinical Decision Making  Low    Rehab Potential  Good    PT Frequency  2x / week    PT Duration  6 weeks    PT Treatment/Interventions  ADLs/Self Care Home Management;Cryotherapy;Electrical Stimulation;Moist Heat;Therapeutic exercise;Therapeutic activities;Functional mobility training;Stair training;Gait training;Neuromuscular re-education;Patient/family education;Manual techniques;Taping;Dry needling;Passive range of motion    PT Next Visit Plan  review prior HEP (pt says she will bring, if not, print new copy), focus on aerobic and strengthening exercises, use modalities sparingly    PT Home Exercise Plan  Access Code: [email or web address]    Consulted and Agree with Plan of Care  Patient       Patient will benefit from skilled therapeutic intervention in order to improve the following deficits and impairments:  Pain, Postural dysfunction, Decreased range of motion, Impaired flexibility, Decreased mobility, Decreased strength  Visit Diagnosis: Chronic bilateral low back pain with bilateral sciatica - Plan: PT plan of care cert/re-cert  Chronic pain of right knee - Plan: PT plan of care cert/re-cert  Other abnormalities of gait and mobility - Plan: PT plan of care cert/re-cert  Muscle weakness (generalized) - Plan: PT plan of care cert/re-cert     Problem List Patient Active Problem List   Diagnosis Date Noted  . Kidney stone 02/26/2019  . Recurrent sinusitis 02/25/2019  . Hx of colonic polyp 02/25/2019  . DDD (degenerative disc disease), cervical 09/17/2018  . Degenerative  scoliosis 04/22/2018  . Primary insomnia 06/25/2017  . Chronic interstitial cystitis 02/01/2017  . Cough,  persistent 01/09/2017  . Hand pain, right 07/20/2016  . Deviated septum 05/12/2016  . Nasal turbinate hypertrophy 05/12/2016  . Dysphagia   . Allergic rhinitis 01/25/2016  . Other and combined forms of senile cataract 07/15/2015  . Dyspnea   . Left hip pain 04/30/2015  . Sciatica 04/30/2015  . Knee pain, right 04/30/2015  . Bilateral carpal tunnel syndrome 03/04/2015  . Hyperlipidemia 03/01/2015  . Pulmonary nodules 02/12/2015  . External hemorrhoids 02/05/2015  . Chronic night sweats 01/08/2015  . Atypical chest pain 01/01/2015  . Renal insufficiency 07/16/2014  . Memory loss 05/22/2014  . Peripheral neuropathy 05/22/2014  . Headache 10/13/2013  . Pain, joint, multiple sites 08/18/2013  . ANA positive 07/29/2013  . Depression 02/05/2013  . Family history of malignant hyperthermia 02/05/2013  . Malignant neoplasm of lower-outer quadrant of right breast of female, estrogen receptor positive ([place]) 01/03/2013  . Splenic lesion 09/02/2012  . Asthma, allergic 08/05/2012  . GERD (gastroesophageal reflux disease) 06/03/2012  . Edema 04/08/2012  . Stricture and stenosis of esophagus 10/19/2011  . Constipation - functional 07/21/2011  . Nonspecific (abnormal) findings on radiological and other examination of biliary tract 07/21/2011  . Osteoporosis 04/17/2011  . FIBROCYSTIC BREAST DISEASE, HX OF 10/18/2010  . Essential hypertension 08/25/2010  . CAD in native artery 08/25/2010  . TOBACCO ABUSE, HX OF 08/25/2010  . GENERALIZED ANXIETY DISORDER 08/03/2010  . FIBROMYALGIA 01/11/2010      Laureen Abrahams, PT, DPT 08/28/19 1:12 PM     [facility] [place], [place], [address] Phone: [phone]   Fax:  [phone]  Name: [name] MRN: [id number] Date of Birth: 10-06-1946

## 2019-08-28 NOTE — Patient Instructions (Signed)
Access Code: [email or web address]  URL: [email or web address]  Date: 08/28/2019  Prepared by: Faustino Congress   Exercises  Supine Lower Trunk Rotation - 3 reps - 1 sets - 30 sec hold - 2x daily - 7x weekly  Supine Piriformis Stretch with Foot on Ground - 3 reps - 1 sets - 30 sec hold - 2x daily - 7x weekly  Supine Single Knee to Chest Stretch - 3 reps - 1 sets - 30 sec hold - 2x daily - 7x weekly  Supine Posterior Pelvic Tilt - 10 reps - 1-2 sets - 5 sec hold - 2x daily - 7x weekly  Bent Knee Fallouts - 10 reps - 1-2 sets - 2x daily - 7x weekly  Supine March - 10 reps - 1-2 sets - 2x daily - 7x weekly  Supine Heel Slides - 10 reps - 1-2 sets - 2x daily - 7x weekly

## 2019-09-03 ENCOUNTER — Encounter: Payer: Medicare Other | Admitting: Physical Therapy

## 2019-09-08 ENCOUNTER — Ambulatory Visit (INDEPENDENT_AMBULATORY_CARE_PROVIDER_SITE_OTHER): Payer: Medicare Other | Admitting: Physical Therapy

## 2019-09-08 ENCOUNTER — Encounter: Payer: Self-pay | Admitting: Physical Therapy

## 2019-09-08 ENCOUNTER — Other Ambulatory Visit: Payer: Self-pay

## 2019-09-08 DIAGNOSIS — M25561 Pain in right knee: Secondary | ICD-10-CM | POA: Diagnosis not present

## 2019-09-08 DIAGNOSIS — G8929 Other chronic pain: Secondary | ICD-10-CM

## 2019-09-08 DIAGNOSIS — M6281 Muscle weakness (generalized): Secondary | ICD-10-CM | POA: Diagnosis not present

## 2019-09-08 DIAGNOSIS — M5441 Lumbago with sciatica, right side: Secondary | ICD-10-CM

## 2019-09-08 DIAGNOSIS — R2689 Other abnormalities of gait and mobility: Secondary | ICD-10-CM

## 2019-09-08 DIAGNOSIS — M5442 Lumbago with sciatica, left side: Secondary | ICD-10-CM | POA: Diagnosis not present

## 2019-09-08 NOTE — Therapy (Signed)
[place] [place], [place], [address] Phone: [phone]   Fax:  [phone]  Physical Therapy Treatment  Patient Details  Name: [name] MRN: [id number] Date of Birth: 1946/02/05 Referring Provider (PT): Levy Pupa, [place]   Encounter Date: 09/08/2019  PT End of Session - 09/08/19 1325    Visit Number  2    Number of Visits  12    Date for PT Re-Evaluation  10/09/19    PT Start Time  [id number]    PT Stop Time  1231    PT Time Calculation (min)  47 min    Activity Tolerance  Patient tolerated treatment well    Behavior During Therapy  [name] for tasks assessed/performed       Past Medical History:  Diagnosis Date  . Allergy   . Anemia   . Anxiety    past hx   . Arthritis   . Asthma   . Atrial flutter ([place])    past history- not current  . CAD (coronary artery disease) CARDIOLOGIST--  DR Angelena Form   mild non-obstructive cad  . Cancer ([place])    right  . Cataract    bilaterally removed   . Chronic constipation   . Chronic kidney disease    interstitial cystitis  . COPD (chronic obstructive pulmonary disease) ([place])   . Depression    past hx   . Family history of malignant hyperthermia    father had this  . Fibromyalgia   . Frequency of urination   . GERD (gastroesophageal reflux disease)   . H/O hiatal hernia   . History of basal cell carcinoma excision    X2  . History of breast cancer ONCOLOGIST-- DR Jana Hakim---  NO RECURRANCE   DX 07/2012;  LOW GRADE DCIS  ER+PR+  ----  S/P RIGHT LUMPECTOMY WITH NEGATIVE MARGINS/   RADIATION ENDED 11/2012  . History of chronic bronchitis   . History of colonic polyps   . History of tachycardia    CONTROLLED  WITH ATENOLOL  . Hyperlipidemia   . Hypertension   . Neuromuscular disorder (HCC)    fibromyalgia  . Osteoporosis 01/2019   T score -2.2 stable/improved from prior study  . Pelvic pain   . Personal history of radiation therapy   . S/P radiation  therapy 11/12/12 - 12/05/12   right Breast  . Sepsis ([place]) 2014   from UTI   . Sinus headache   . Urgency of urination     Past Surgical History:  Procedure Laterality Date  . [place] STUDY N/A 04/03/2016   Procedure: [place] STUDY;  Surgeon: Manus Gunning, MD;  Location: [facility];  Service: Gastroenterology;  Laterality: N/A;  . BREAST LUMPECTOMY Right 10-11-2012   W/ SLN BX  . CARDIAC CATHETERIZATION  09-13-2007  DR Lia Foyer   WELL-PRESERVED LVF/  DIFFUSE SCATTERED CORONARY CALCIFACATION AND ATHEROSCLEROSIS WITHOUT OBSTRUCTION  . CARDIAC CATHETERIZATION  08-04-2010  DR Angelena Form   NON-OBSTRUCTIVE CAD/  pLAD 40%/  oLAD 30%/  mLAD 30%/  pRCA 30%/  EF 60%  . CARDIOVASCULAR STRESS TEST  06-18-2012  DR McALHANY   LOW RISK NUCLEAR STUDY/  SMALL FIXED AREA OF MODERATELY DECREASED UPTAKE IN ANTEROSEPTAL WALL WHICH MAY BE ARTIFACTUAL/  NO ISCHEMIA/  EF 68%  . COLONOSCOPY  09-29-2010  . CYSTOSCOPY    . CYSTOSCOPY WITH HYDRODISTENSION AND BIOPSY N/A 03/06/2014   Procedure: CYSTOSCOPY/HYDRODISTENSION/ INSTILATION OF MARCAINE AND PYRIDIUM;  Surgeon: Shane Crutch  Gaynelle Arabian, MD;  Location: [facility];  Service: Urology;  Laterality: N/A;  . ESOPHAGEAL MANOMETRY N/A 04/03/2016   Procedure: ESOPHAGEAL MANOMETRY (EM);  Surgeon: Manus Gunning, MD;  [facility];  Service: Gastroenterology;  Laterality: N/A;  . EXTRACORPOREAL SHOCK WAVE LITHOTRIPSY Left 02/06/2019   Procedure: EXTRACORPOREAL SHOCK WAVE LITHOTRIPSY (ESWL);  Surgeon: Kathie Rhodes, MD;  [facility]: [facility];  Service: Urology;  Laterality: Left;  . NASAL SINUS SURGERY  1985  . ORIF RIGHT ANKLE  FX  2006  . POLYPECTOMY    . REMOVAL VOCAL CORD CYST  FEB 2014  . RIGHT BREAST BX  08-23-2012  . RIGHT HAND SURGERY  X3  LAST ONE 2009   INCLUDES  ORIF RIGHT 5TH FINGER AND REVISION TWICE  . SKIN CANCER EXCISION    . TONSILLECTOMY AND ADENOIDECTOMY  AGE 3  . TOTAL ABDOMINAL HYSTERECTOMY W/ BILATERAL  SALPINGOOPHORECTOMY  1982   W/  APPENDECTOMY  . TRANSTHORACIC ECHOCARDIOGRAM  06-24-2012   GRADE I DIASTOLIC DYSFUNCTION/  EF 55-60%/  MILD MR  . UPPER GASTROINTESTINAL ENDOSCOPY      There were no vitals filed for this visit.  Subjective Assessment - 09/08/19 1144    Subjective  my pain is so bad today; "I'm about all I can take."    Limitations  Walking    How long can you walk comfortably?  15 min    Patient Stated Goals  walk without knee or back hurting, get up off floor by myself    Currently in Pain?  Yes    Pain Score  9    pt in no acute distress   Pain Location  Back   and Lt hip   Pain Orientation  Lower;Mid;Left    Pain Descriptors / Indicators  Aching;Dull;Spasm    Pain Type  Chronic pain    Pain Onset  More than a month ago    Pain Frequency  Constant    Aggravating Factors   walking    Pain Relieving Factors  elevation, voltaren gel    Pain Onset  More than a month ago                       [facility] Adult PT Treatment/Exercise - 09/08/19 1152      Exercises   Exercises  Lumbar      Lumbar Exercises: Stretches   Double Knee to Chest Stretch  3 reps;20 seconds    Double Knee to Chest Stretch Limitations  with rocking    Lower Trunk Rotation  3 reps;30 seconds    Piriformis Stretch  Right;Left;3 reps;30 seconds      Lumbar Exercises: Aerobic   Nustep  L3 x 8 min      Lumbar Exercises: Supine   Pelvic Tilt  10 reps;5 seconds    Clam  10 reps    Heel Slides  10 reps    Bent Knee Raise  10 reps    Bridge  5 reps;5 seconds    Bridge Limitations  mod cues for proper technique      Lumbar Exercises: Quadruped   Madcat/Old Horse  5 reps    Other Quadruped Lumbar Exercises  childs pose 2 x 10 sec      Modalities   Modalities  Electrical Stimulation;Moist Heat      Moist Heat Therapy   Number Minutes Moist Heat  15 Minutes    Moist Heat Location  Lumbar Spine   during supine  exercises     Electrical Stimulation   Electrical Stimulation  Location  Lt hip/low back    Electrical Stimulation Action  TENS (premod set up)    Electrical Stimulation Parameters  to tolerance (set up prior to start of session - pt wore during exercises)    Electrical Stimulation Goals  Pain             PT Education - 09/08/19 1325    Education Details  cont current HEP, use of TENS with exercise    Person(s) Educated  Patient    Methods  Explanation;Demonstration    Comprehension  Verbalized understanding          PT Long Term Goals - 08/28/19 1306      PT LONG TERM GOAL #1   Title  independent with HEP    Status  New    Target Date  10/09/19      PT LONG TERM GOAL #2   Title  FOTO score improved to </= 43% limited for improved function    Status  New    Target Date  10/09/19      PT LONG TERM GOAL #3   Title  perform thoracolumbar flexion without increase in pain    Status  New    Target Date  10/09/19      PT LONG TERM GOAL #4   Title  improve 5x STS to </= 17 sec for improved functional strength    Status  New    Target Date  10/09/19      PT LONG TERM GOAL #5   Title  verbalize compliance with community fitness and plans to continue at d/c    Status  New    Target Date  10/09/19            Plan - 09/08/19 1326    Clinical Impression Statement  Pt arrived today c/o 9/10 pain in low back and Lt hip.  TENS unit applied to each area and utilized during exercise.  Pain decreased to 4/10 after session today.  Recommended continued work with HEP at home and adding TENS unit PRN to help with pain.    Personal Factors and Comorbidities  Comorbidity 3+;Past/Current Experience;Time since onset of injury/illness/exacerbation    Comorbidities  osteoporosis, HTN, fibromyalgia, COPD, CAD, asthma    Examination-Activity Limitations  Sit;Stand;Locomotion Level    Examination-Participation Restrictions  Cleaning;Driving;Yard Work    Stability/Clinical Decision Making  Stable/Uncomplicated    Rehab Potential  Good    PT  Frequency  2x / week    PT Duration  6 weeks    PT Treatment/Interventions  ADLs/Self Care Home Management;Cryotherapy;Electrical Stimulation;Moist Heat;Therapeutic exercise;Therapeutic activities;Functional mobility training;Stair training;Gait training;Neuromuscular re-education;Patient/family education;Manual techniques;Taping;Dry needling;Passive range of motion    PT Next Visit Plan  focus on aerobic and strengthening exercises, use modalities sparingly (or with exercise)    PT Home Exercise Plan  Access Code: [email or web address]    Consulted and Agree with Plan of Care  Patient       Patient will benefit from skilled therapeutic intervention in order to improve the following deficits and impairments:  Pain, Postural dysfunction, Decreased range of motion, Impaired flexibility, Decreased mobility, Decreased strength  Visit Diagnosis: Chronic bilateral low back pain with bilateral sciatica  Chronic pain of right knee  Other abnormalities of gait and mobility  Muscle weakness (generalized)     Problem List Patient Active Problem List   Diagnosis Date Noted  . Kidney stone 02/26/2019  .  Recurrent sinusitis 02/25/2019  . Hx of colonic polyp 02/25/2019  . DDD (degenerative disc disease), cervical 09/17/2018  . Degenerative scoliosis 04/22/2018  . Primary insomnia 06/25/2017  . Chronic interstitial cystitis 02/01/2017  . Cough, persistent 01/09/2017  . Hand pain, right 07/20/2016  . Deviated septum 05/12/2016  . Nasal turbinate hypertrophy 05/12/2016  . Dysphagia   . Allergic rhinitis 01/25/2016  . Other and combined forms of senile cataract 07/15/2015  . Dyspnea   . Left hip pain 04/30/2015  . Sciatica 04/30/2015  . Knee pain, right 04/30/2015  . Bilateral carpal tunnel syndrome 03/04/2015  . Hyperlipidemia 03/01/2015  . Pulmonary nodules 02/12/2015  . External hemorrhoids 02/05/2015  . Chronic night sweats 01/08/2015  . Atypical chest pain 01/01/2015  . Renal insufficiency  07/16/2014  . Memory loss 05/22/2014  . Peripheral neuropathy 05/22/2014  . Headache 10/13/2013  . Pain, joint, multiple sites 08/18/2013  . ANA positive 07/29/2013  . Depression 02/05/2013  . Family history of malignant hyperthermia 02/05/2013  . Malignant neoplasm of lower-outer quadrant of right breast of female, estrogen receptor positive ([place]) 01/03/2013  . Splenic lesion 09/02/2012  . Asthma, allergic 08/05/2012  . GERD (gastroesophageal reflux disease) 06/03/2012  . Edema 04/08/2012  . Stricture and stenosis of esophagus 10/19/2011  . Constipation - functional 07/21/2011  . Nonspecific (abnormal) findings on radiological and other examination of biliary tract 07/21/2011  . Osteoporosis 04/17/2011  . FIBROCYSTIC BREAST DISEASE, HX OF 10/18/2010  . Essential hypertension 08/25/2010  . CAD in native artery 08/25/2010  . TOBACCO ABUSE, HX OF 08/25/2010  . GENERALIZED ANXIETY DISORDER 08/03/2010  . FIBROMYALGIA 01/11/2010      Laureen Abrahams, PT, DPT 09/08/19 1:28 PM     [facility] [place], [place], [address] Phone: [phone]   Fax:  [phone]  Name: [name] MRN: [id number] Date of Birth: 10/24/1946

## 2019-09-08 NOTE — Patient Instructions (Signed)
Access Code: [email or web address]  URL: [email or web address]  Date: 08/28/2019  Prepared by: Faustino Congress   Exercises  Supine Lower Trunk Rotation - 3 reps - 1 sets - 30 sec hold - 2x daily - 7x weekly  Supine Piriformis Stretch with Foot on Ground - 3 reps - 1 sets - 30 sec hold - 2x daily - 7x weekly  Supine Single Knee to Chest Stretch - 3 reps - 1 sets - 30 sec hold - 2x daily - 7x weekly  Supine Posterior Pelvic Tilt - 10 reps - 1-2 sets - 5 sec hold - 2x daily - 7x weekly  Bent Knee Fallouts - 10 reps - 1-2 sets - 2x daily - 7x weekly  Supine March - 10 reps - 1-2 sets - 2x daily - 7x weekly  Supine Heel Slides - 10 reps - 1-2 sets - 2x daily - 7x weekly

## 2019-09-11 ENCOUNTER — Ambulatory Visit (INDEPENDENT_AMBULATORY_CARE_PROVIDER_SITE_OTHER): Payer: Medicare Other | Admitting: Physical Therapy

## 2019-09-11 ENCOUNTER — Encounter: Payer: Self-pay | Admitting: Physical Therapy

## 2019-09-11 ENCOUNTER — Other Ambulatory Visit: Payer: Self-pay

## 2019-09-11 DIAGNOSIS — G8929 Other chronic pain: Secondary | ICD-10-CM

## 2019-09-11 DIAGNOSIS — M25561 Pain in right knee: Secondary | ICD-10-CM

## 2019-09-11 DIAGNOSIS — M5441 Lumbago with sciatica, right side: Secondary | ICD-10-CM

## 2019-09-11 DIAGNOSIS — R2689 Other abnormalities of gait and mobility: Secondary | ICD-10-CM | POA: Diagnosis not present

## 2019-09-11 DIAGNOSIS — M5442 Lumbago with sciatica, left side: Secondary | ICD-10-CM

## 2019-09-11 DIAGNOSIS — M6281 Muscle weakness (generalized): Secondary | ICD-10-CM

## 2019-09-11 NOTE — Patient Instructions (Signed)
Access Code: [email or web address]  URL: [email or web address]  Date: 09/11/2019  Prepared by: Faustino Congress   Exercises  Supine Lower Trunk Rotation - 3 reps - 1 sets - 30 sec hold - 2x daily - 7x weekly  Supine Piriformis Stretch with Foot on Ground - 3 reps - 1 sets - 30 sec hold - 2x daily - 7x weekly  Supine Single Knee to Chest Stretch - 3 reps - 1 sets - 30 sec hold - 2x daily - 7x weekly  Supine Posterior Pelvic Tilt - 10 reps - 1-2 sets - 5 sec hold - 2x daily - 7x weekly  Bent Knee Fallouts - 10 reps - 1-2 sets - 2x daily - 7x weekly  Supine March - 10 reps - 1-2 sets - 2x daily - 7x weekly  Supine Heel Slides - 10 reps - 1-2 sets - 2x daily - 7x weekly  Scapular Retraction with Resistance Advanced - 20 reps - 1 sets - 5 sec hold - 1x daily - 7x weekly  Squatting Shoulder Row with Anchored Resistance - 20 reps - 1 sets - 5 sec hold - 1x daily - 7x weekly

## 2019-09-11 NOTE — Therapy (Signed)
[name] [place], [place], [address] Phone: [phone]   Fax:  [phone]  Physical Therapy Treatment  Patient Details  Name: [name] MRN: [id number] Date of Birth: 1946/07/20 Referring Provider (PT): Levy Pupa, [place]   Encounter Date: 09/11/2019  PT End of Session - 09/11/19 1239    Visit Number  3    Number of Visits  12    Date for PT Re-Evaluation  10/09/19    PT Start Time  [id number]    PT Stop Time  1225    PT Time Calculation (min)  41 min    Activity Tolerance  Patient tolerated treatment well    Behavior During Therapy  [facility] for tasks assessed/performed       Past Medical History:  Diagnosis Date  . Allergy   . Anemia   . Anxiety    past hx   . Arthritis   . Asthma   . Atrial flutter ([place])    past history- not current  . CAD (coronary artery disease) CARDIOLOGIST--  DR Angelena Form   mild non-obstructive cad  . Cancer ([place])    right  . Cataract    bilaterally removed   . Chronic constipation   . Chronic kidney disease    interstitial cystitis  . COPD (chronic obstructive pulmonary disease) ([place])   . Depression    past hx   . Family history of malignant hyperthermia    father had this  . Fibromyalgia   . Frequency of urination   . GERD (gastroesophageal reflux disease)   . H/O hiatal hernia   . History of basal cell carcinoma excision    X2  . History of breast cancer ONCOLOGIST-- DR Jana Hakim---  NO RECURRANCE   DX 07/2012;  LOW GRADE DCIS  ER+PR+  ----  S/P RIGHT LUMPECTOMY WITH NEGATIVE MARGINS/   RADIATION ENDED 11/2012  . History of chronic bronchitis   . History of colonic polyps   . History of tachycardia    CONTROLLED  WITH ATENOLOL  . Hyperlipidemia   . Hypertension   . Neuromuscular disorder (HCC)    fibromyalgia  . Osteoporosis 01/2019   T score -2.2 stable/improved from prior study  . Pelvic pain   . Personal history of radiation therapy   . S/P radiation  therapy 11/12/12 - 12/05/12   right Breast  . Sepsis ([place]) 2014   from UTI   . Sinus headache   . Urgency of urination     Past Surgical History:  Procedure Laterality Date  . [place] STUDY N/A 04/03/2016   Procedure: [place] STUDY;  Surgeon: Manus Gunning, MD;  [facility];  Service: Gastroenterology;  Laterality: N/A;  . BREAST LUMPECTOMY Right 10-11-2012   W/ SLN BX  . CARDIAC CATHETERIZATION  09-13-2007  DR Lia Foyer   WELL-PRESERVED LVF/  DIFFUSE SCATTERED CORONARY CALCIFACATION AND ATHEROSCLEROSIS WITHOUT OBSTRUCTION  . CARDIAC CATHETERIZATION  08-04-2010  DR Angelena Form   NON-OBSTRUCTIVE CAD/  pLAD 40%/  oLAD 30%/  mLAD 30%/  pRCA 30%/  EF 60%  . CARDIOVASCULAR STRESS TEST  06-18-2012  DR McALHANY   LOW RISK NUCLEAR STUDY/  SMALL FIXED AREA OF MODERATELY DECREASED UPTAKE IN ANTEROSEPTAL WALL WHICH MAY BE ARTIFACTUAL/  NO ISCHEMIA/  EF 68%  . COLONOSCOPY  09-29-2010  . CYSTOSCOPY    . CYSTOSCOPY WITH HYDRODISTENSION AND BIOPSY N/A 03/06/2014   Procedure: CYSTOSCOPY/HYDRODISTENSION/ INSTILATION OF MARCAINE AND PYRIDIUM;  Surgeon: Shane Crutch  Gaynelle Arabian, MD;  Location: [facility];  Service: Urology;  Laterality: N/A;  . ESOPHAGEAL MANOMETRY N/A 04/03/2016   Procedure: ESOPHAGEAL MANOMETRY (EM);  Surgeon: Manus Gunning, MD;  [facility];  Service: Gastroenterology;  Laterality: N/A;  . EXTRACORPOREAL SHOCK WAVE LITHOTRIPSY Left 02/06/2019   Procedure: EXTRACORPOREAL SHOCK WAVE LITHOTRIPSY (ESWL);  Surgeon: Kathie Rhodes, MD;  [facility];  Service: Urology;  Laterality: Left;  . NASAL SINUS SURGERY  1985  . ORIF RIGHT ANKLE  FX  2006  . POLYPECTOMY    . REMOVAL VOCAL CORD CYST  FEB 2014  . RIGHT BREAST BX  08-23-2012  . RIGHT HAND SURGERY  X3  LAST ONE 2009   INCLUDES  ORIF RIGHT 5TH FINGER AND REVISION TWICE  . SKIN CANCER EXCISION    . TONSILLECTOMY AND ADENOIDECTOMY  AGE 69  . TOTAL ABDOMINAL HYSTERECTOMY W/ BILATERAL  SALPINGOOPHORECTOMY  1982   W/  APPENDECTOMY  . TRANSTHORACIC ECHOCARDIOGRAM  06-24-2012   GRADE I DIASTOLIC DYSFUNCTION/  EF 55-60%/  MILD MR  . UPPER GASTROINTESTINAL ENDOSCOPY      There were no vitals filed for this visit.  Subjective Assessment - 09/11/19 1148    Subjective  feeling much better today; doing well.    Limitations  Walking    How long can you walk comfortably?  15 min    Patient Stated Goals  walk without knee or back hurting, get up off floor by myself    Currently in Pain?  Yes    Pain Score  5     Pain Location  Back    Pain Orientation  Mid;Left;Right;Lower    Pain Descriptors / Indicators  Aching;Dull;Spasm    Pain Type  Chronic pain    Pain Onset  More than a month ago    Pain Frequency  Constant    Aggravating Factors   walking    Pain Relieving Factors  elevation, volatren gel    Pain Onset  More than a month ago                       [facility] Adult PT Treatment/Exercise - 09/11/19 1149      Lumbar Exercises: Stretches   Double Knee to Chest Stretch  3 reps;20 seconds    Double Knee to Chest Stretch Limitations  with rocking    Piriformis Stretch  Right;Left;3 reps;30 seconds    Gastroc Stretch  3 reps;30 seconds;Right;Left    Gastroc Stretch Limitations  slant board      Lumbar Exercises: Aerobic   Nustep  L5 x 8 min      Lumbar Exercises: Standing   Heel Raises  20 reps    Heel Raises Limitations  off 4" step    Row  Both;20 reps;Theraband    Theraband Level (Row)  Level 2 (Red)    Shoulder Extension  Both;20 reps;Theraband    Theraband Level (Shoulder Extension)  Level 2 (Red)      Lumbar Exercises: Seated   Sit to Stand  10 reps    Sit to Stand Limitations  with 5#; chest press at end range      Lumbar Exercises: Supine   Bridge  10 reps;5 seconds             PT Education - 09/11/19 1239    Education Details  added to HEP    Person(s) Educated  Patient    Methods  Explanation;Handout;Demonstration  Comprehension  Verbalized understanding;Returned demonstration          PT Long Term Goals - 08/28/19 1306      PT LONG TERM GOAL #1   Title  independent with HEP    Status  New    Target Date  10/09/19      PT LONG TERM GOAL #2   Title  FOTO score improved to </= 43% limited for improved function    Status  New    Target Date  10/09/19      PT LONG TERM GOAL #3   Title  perform thoracolumbar flexion without increase in pain    Status  New    Target Date  10/09/19      PT LONG TERM GOAL #4   Title  improve 5x STS to </= 17 sec for improved functional strength    Status  New    Target Date  10/09/19      PT LONG TERM GOAL #5   Title  verbalize compliance with community fitness and plans to continue at d/c    Status  New    Target Date  10/09/19            Plan - 09/11/19 1240    Clinical Impression Statement  Pt able to tolerate increased activity today with decreased pain overall.  Added additional strengthening exercises to HEP which she tolerated well.  Progressing well with PT.    Personal Factors and Comorbidities  Comorbidity 3+;Past/Current Experience;Time since onset of injury/illness/exacerbation    Comorbidities  osteoporosis, HTN, fibromyalgia, COPD, CAD, asthma    Examination-Activity Limitations  Sit;Stand;Locomotion Level    Examination-Participation Restrictions  Cleaning;Driving;Yard Work    Stability/Clinical Decision Making  Stable/Uncomplicated    Rehab Potential  Good    PT Frequency  2x / week    PT Duration  6 weeks    PT Treatment/Interventions  ADLs/Self Care Home Management;Cryotherapy;Electrical Stimulation;Moist Heat;Therapeutic exercise;Therapeutic activities;Functional mobility training;Stair training;Gait training;Neuromuscular re-education;Patient/family education;Manual techniques;Taping;Dry needling;Passive range of motion    PT Next Visit Plan  focus on aerobic and strengthening exercises, use modalities sparingly (or with exercise)     PT Home Exercise Plan  Access Code: [email or web address]    Consulted and Agree with Plan of Care  Patient       Patient will benefit from skilled therapeutic intervention in order to improve the following deficits and impairments:  Pain, Postural dysfunction, Decreased range of motion, Impaired flexibility, Decreased mobility, Decreased strength  Visit Diagnosis: Chronic bilateral low back pain with bilateral sciatica  Chronic pain of right knee  Other abnormalities of gait and mobility  Muscle weakness (generalized)     Problem List Patient Active Problem List   Diagnosis Date Noted  . Kidney stone 02/26/2019  . Recurrent sinusitis 02/25/2019  . Hx of colonic polyp 02/25/2019  . DDD (degenerative disc disease), cervical 09/17/2018  . Degenerative scoliosis 04/22/2018  . Primary insomnia 06/25/2017  . Chronic interstitial cystitis 02/01/2017  . Cough, persistent 01/09/2017  . Hand pain, right 07/20/2016  . Deviated septum 05/12/2016  . Nasal turbinate hypertrophy 05/12/2016  . Dysphagia   . Allergic rhinitis 01/25/2016  . Other and combined forms of senile cataract 07/15/2015  . Dyspnea   . Left hip pain 04/30/2015  . Sciatica 04/30/2015  . Knee pain, right 04/30/2015  . Bilateral carpal tunnel syndrome 03/04/2015  . Hyperlipidemia 03/01/2015  . Pulmonary nodules 02/12/2015  . External hemorrhoids 02/05/2015  . Chronic night sweats 01/08/2015  .  Atypical chest pain 01/01/2015  . Renal insufficiency 07/16/2014  . Memory loss 05/22/2014  . Peripheral neuropathy 05/22/2014  . Headache 10/13/2013  . Pain, joint, multiple sites 08/18/2013  . ANA positive 07/29/2013  . Depression 02/05/2013  . Family history of malignant hyperthermia 02/05/2013  . Malignant neoplasm of lower-outer quadrant of right breast of female, estrogen receptor positive ([place]) 01/03/2013  . Splenic lesion 09/02/2012  . Asthma, allergic 08/05/2012  . GERD (gastroesophageal reflux disease) 06/03/2012   . Edema 04/08/2012  . Stricture and stenosis of esophagus 10/19/2011  . Constipation - functional 07/21/2011  . Nonspecific (abnormal) findings on radiological and other examination of biliary tract 07/21/2011  . Osteoporosis 04/17/2011  . FIBROCYSTIC BREAST DISEASE, HX OF 10/18/2010  . Essential hypertension 08/25/2010  . CAD in native artery 08/25/2010  . TOBACCO ABUSE, HX OF 08/25/2010  . GENERALIZED ANXIETY DISORDER 08/03/2010  . FIBROMYALGIA 01/11/2010      Laureen Abrahams, PT, DPT 09/11/19 12:41 PM     [facility] [place], [place], [address] Phone: [phone]   Fax:  [phone]  Name: Deshon Hovsepian MRN: [id number] Date of Birth: [birth year]

## 2019-09-15 ENCOUNTER — Encounter: Payer: Medicare Other | Admitting: Physical Therapy

## 2019-09-18 ENCOUNTER — Other Ambulatory Visit: Payer: Self-pay

## 2019-09-18 ENCOUNTER — Encounter: Payer: Self-pay | Admitting: Physical Therapy

## 2019-09-18 ENCOUNTER — Ambulatory Visit (INDEPENDENT_AMBULATORY_CARE_PROVIDER_SITE_OTHER): Payer: Medicare Other | Admitting: Physical Therapy

## 2019-09-18 DIAGNOSIS — M5442 Lumbago with sciatica, left side: Secondary | ICD-10-CM

## 2019-09-18 DIAGNOSIS — G8929 Other chronic pain: Secondary | ICD-10-CM

## 2019-09-18 DIAGNOSIS — M5441 Lumbago with sciatica, right side: Secondary | ICD-10-CM

## 2019-09-18 DIAGNOSIS — M6281 Muscle weakness (generalized): Secondary | ICD-10-CM | POA: Diagnosis not present

## 2019-09-18 DIAGNOSIS — M25561 Pain in right knee: Secondary | ICD-10-CM

## 2019-09-18 DIAGNOSIS — R2689 Other abnormalities of gait and mobility: Secondary | ICD-10-CM

## 2019-09-18 NOTE — Therapy (Signed)
[place], [place], [address] Phone: [phone]   Fax:  [phone]  Physical Therapy Treatment  Patient Details  Name: [name] MRN: [id number] Date of Birth: Jun 17, 1946 Referring Provider (PT): Levy Pupa, [place]   Encounter Date: 09/18/2019  PT End of Session - 09/18/19 1248    Visit Number  4    Number of Visits  12    Date for PT Re-Evaluation  10/09/19    PT Start Time  1146    PT Stop Time  1227    PT Time Calculation (min)  41 min    Activity Tolerance  Patient tolerated treatment well    Behavior During Therapy  [facility] for tasks assessed/performed       Past Medical History:  Diagnosis Date  . Allergy   . Anemia   . Anxiety    past hx   . Arthritis   . Asthma   . Atrial flutter ([place])    past history- not current  . CAD (coronary artery disease) CARDIOLOGIST--  DR Angelena Form   mild non-obstructive cad  . Cancer ([place])    right  . Cataract    bilaterally removed   . Chronic constipation   . Chronic kidney disease    interstitial cystitis  . COPD (chronic obstructive pulmonary disease) ([place])   . Depression    past hx   . Family history of malignant hyperthermia    father had this  . Fibromyalgia   . Frequency of urination   . GERD (gastroesophageal reflux disease)   . H/O hiatal hernia   . History of basal cell carcinoma excision    X2  . History of breast cancer ONCOLOGIST-- DR Jana Hakim---  NO RECURRANCE   DX 07/2012;  LOW GRADE DCIS  ER+PR+  ----  S/P RIGHT LUMPECTOMY WITH NEGATIVE MARGINS/   RADIATION ENDED 11/2012  . History of chronic bronchitis   . History of colonic polyps   . History of tachycardia    CONTROLLED  WITH ATENOLOL  . Hyperlipidemia   . Hypertension   . Neuromuscular disorder (HCC)    fibromyalgia  . Osteoporosis 01/2019   T score -2.2 stable/improved from prior study  . Pelvic pain   . Personal history of radiation therapy   . S/P radiation  therapy 11/12/12 - 12/05/12   right Breast  . Sepsis ([place]) 2014   from UTI   . Sinus headache   . Urgency of urination     Past Surgical History:  Procedure Laterality Date  . [place] STUDY N/A 04/03/2016   Procedure: [place] STUDY;  Surgeon: Manus Gunning, MD;  Location: [facility];  Service: Gastroenterology;  Laterality: N/A;  . BREAST LUMPECTOMY Right 10-11-2012   W/ SLN BX  . CARDIAC CATHETERIZATION  09-13-2007  DR Lia Foyer   WELL-PRESERVED LVF/  DIFFUSE SCATTERED CORONARY CALCIFACATION AND ATHEROSCLEROSIS WITHOUT OBSTRUCTION  . CARDIAC CATHETERIZATION  08-04-2010  DR Angelena Form   NON-OBSTRUCTIVE CAD/  pLAD 40%/  oLAD 30%/  mLAD 30%/  pRCA 30%/  EF 60%  . CARDIOVASCULAR STRESS TEST  06-18-2012  DR McALHANY   LOW RISK NUCLEAR STUDY/  SMALL FIXED AREA OF MODERATELY DECREASED UPTAKE IN ANTEROSEPTAL WALL WHICH MAY BE ARTIFACTUAL/  NO ISCHEMIA/  EF 68%  . COLONOSCOPY  09-29-2010  . CYSTOSCOPY    . CYSTOSCOPY WITH HYDRODISTENSION AND BIOPSY N/A 03/06/2014   Procedure: CYSTOSCOPY/HYDRODISTENSION/ INSTILATION OF MARCAINE AND PYRIDIUM;  Surgeon: Shane Crutch  Gaynelle Arabian, MD;  Location: [facility];  Service: Urology;  Laterality: N/A;  . ESOPHAGEAL MANOMETRY N/A 04/03/2016   Procedure: ESOPHAGEAL MANOMETRY (EM);  Surgeon: Manus Gunning, MD;  [facility];  Service: Gastroenterology;  Laterality: N/A;  . EXTRACORPOREAL SHOCK WAVE LITHOTRIPSY Left 02/06/2019   Procedure: EXTRACORPOREAL SHOCK WAVE LITHOTRIPSY (ESWL);  Surgeon: Kathie Rhodes, MD;  Location: [facility];  Service: Urology;  Laterality: Left;  . NASAL SINUS SURGERY  1985  . ORIF RIGHT ANKLE  FX  2006  . POLYPECTOMY    . REMOVAL VOCAL CORD CYST  FEB 2014  . RIGHT BREAST BX  08-23-2012  . RIGHT HAND SURGERY  X3  LAST ONE 2009   INCLUDES  ORIF RIGHT 5TH FINGER AND REVISION TWICE  . SKIN CANCER EXCISION    . TONSILLECTOMY AND ADENOIDECTOMY  AGE 89  . TOTAL ABDOMINAL HYSTERECTOMY W/ BILATERAL  SALPINGOOPHORECTOMY  1982   W/  APPENDECTOMY  . TRANSTHORACIC ECHOCARDIOGRAM  06-24-2012   GRADE I DIASTOLIC DYSFUNCTION/  EF 55-60%/  MILD MR  . UPPER GASTROINTESTINAL ENDOSCOPY      There were no vitals filed for this visit.  Subjective Assessment - 09/18/19 1148    Subjective  did a lot of walking and "my back is wonderful."    Limitations  Walking    How long can you walk comfortably?  15 min    Patient Stated Goals  walk without knee or back hurting, get up off floor by myself    Currently in Pain?  Yes    Pain Score  2     Pain Location  Back    Pain Orientation  Mid;Left;Right;Lower    Pain Descriptors / Indicators  Aching;Dull;Spasm    Pain Type  Chronic pain    Pain Onset  More than a month ago    Pain Frequency  Constant    Pain Relieving Factors  walking, movement    Pain Onset  More than a month ago                       [facility] Adult PT Treatment/Exercise - 09/18/19 1150      Lumbar Exercises: Stretches   Double Knee to Chest Stretch  3 reps;20 seconds    Double Knee to Chest Stretch Limitations  with rocking    Gastroc Stretch  3 reps;30 seconds;Right;Left    Gastroc Stretch Limitations  off step    Other Lumbar Stretch Exercise  cross body UE stretch with trunk rotation x 15 sec bil      Lumbar Exercises: Aerobic   Nustep  L5 x 8 min      Lumbar Exercises: Standing   Push / Pull Sled  push only; no weight 35'x4    Row  Both;20 reps;Theraband    Theraband Level (Row)  Level 3 (Green)    Shoulder Extension  Both;20 reps;Theraband    Theraband Level (Shoulder Extension)  Level 3 (Green)    Other Standing Lumbar Exercises  box carry x80'; no weight in box    Other Standing Lumbar Exercises  horizontal abduction x 20 reps; green      Lumbar Exercises: Seated   Sit to Stand  10 reps    Sit to Stand Limitations  with 5#; chest press at end range      Lumbar Exercises: Supine   Bridge  20 reps;5 seconds  PT Long  Term Goals - 09/18/19 1251      PT LONG TERM GOAL #1   Title  independent with HEP    Status  New    Target Date  10/09/19      PT LONG TERM GOAL #2   Title  FOTO score improved to </= 53% limited for improved function    Status  New    Target Date  10/09/19      PT LONG TERM GOAL #3   Title  perform thoracolumbar flexion without increase in pain    Status  New    Target Date  10/09/19      PT LONG TERM GOAL #4   Title  improve 5x STS to </= 17 sec for improved functional strength    Status  New    Target Date  10/09/19      PT LONG TERM GOAL #5   Title  verbalize compliance with community fitness and plans to continue at d/c    Status  New    Target Date  10/09/19            Plan - 09/18/19 1252    Clinical Impression Statement  Pt reporting improvement in pain and symptoms with increased activity while on vacation this past week.  Discussed return to community fitness, and pt to consider returning.  Progressing well with PT.    Personal Factors and Comorbidities  Comorbidity 3+;Past/Current Experience;Time since onset of injury/illness/exacerbation    Comorbidities  osteoporosis, HTN, fibromyalgia, COPD, CAD, asthma    Examination-Activity Limitations  Sit;Stand;Locomotion Level    Examination-Participation Restrictions  Cleaning;Driving;Yard Work    Stability/Clinical Decision Making  Stable/Uncomplicated    Rehab Potential  Good    PT Frequency  2x / week    PT Duration  6 weeks    PT Treatment/Interventions  ADLs/Self Care Home Management;Cryotherapy;Electrical Stimulation;Moist Heat;Therapeutic exercise;Therapeutic activities;Functional mobility training;Stair training;Gait training;Neuromuscular re-education;Patient/family education;Manual techniques;Taping;Dry needling;Passive range of motion    PT Next Visit Plan  focus on aerobic and strengthening exercises, use modalities sparingly (or with exercise), continue per POC    PT Home Exercise Plan  Access Code:  [email or web address]    Consulted and Agree with Plan of Care  Patient       Patient will benefit from skilled therapeutic intervention in order to improve the following deficits and impairments:  Pain, Postural dysfunction, Decreased range of motion, Impaired flexibility, Decreased mobility, Decreased strength  Visit Diagnosis: Chronic bilateral low back pain with bilateral sciatica  Chronic pain of right knee  Other abnormalities of gait and mobility  Muscle weakness (generalized)     Problem List Patient Active Problem List   Diagnosis Date Noted  . Kidney stone 02/26/2019  . Recurrent sinusitis 02/25/2019  . Hx of colonic polyp 02/25/2019  . DDD (degenerative disc disease), cervical 09/17/2018  . Degenerative scoliosis 04/22/2018  . Primary insomnia 06/25/2017  . Chronic interstitial cystitis 02/01/2017  . Cough, persistent 01/09/2017  . Hand pain, right 07/20/2016  . Deviated septum 05/12/2016  . Nasal turbinate hypertrophy 05/12/2016  . Dysphagia   . Allergic rhinitis 01/25/2016  . Other and combined forms of senile cataract 07/15/2015  . Dyspnea   . Left hip pain 04/30/2015  . Sciatica 04/30/2015  . Knee pain, right 04/30/2015  . Bilateral carpal tunnel syndrome 03/04/2015  . Hyperlipidemia 03/01/2015  . Pulmonary nodules 02/12/2015  . External hemorrhoids 02/05/2015  . Chronic night sweats 01/08/2015  . Atypical chest pain 01/01/2015  .  Renal insufficiency 07/16/2014  . Memory loss 05/22/2014  . Peripheral neuropathy 05/22/2014  . Headache 10/13/2013  . Pain, joint, multiple sites 08/18/2013  . ANA positive 07/29/2013  . Depression 02/05/2013  . Family history of malignant hyperthermia 02/05/2013  . Malignant neoplasm of lower-outer quadrant of right breast of female, estrogen receptor positive ([place]) 01/03/2013  . Splenic lesion 09/02/2012  . Asthma, allergic 08/05/2012  . GERD (gastroesophageal reflux disease) 06/03/2012  . Edema 04/08/2012  . Stricture and  stenosis of esophagus 10/19/2011  . Constipation - functional 07/21/2011  . Nonspecific (abnormal) findings on radiological and other examination of biliary tract 07/21/2011  . Osteoporosis 04/17/2011  . FIBROCYSTIC BREAST DISEASE, HX OF 10/18/2010  . Essential hypertension 08/25/2010  . CAD in native artery 08/25/2010  . TOBACCO ABUSE, HX OF 08/25/2010  . GENERALIZED ANXIETY DISORDER 08/03/2010  . FIBROMYALGIA 01/11/2010      Laureen Abrahams, PT, DPT 09/18/19 12:59 PM     [facility] [place], [place], [address] Phone: [phone]   Fax:  [phone]  Name: Mareli Nim MRN: [id number] Date of Birth: 16-Apr-1946

## 2019-09-24 ENCOUNTER — Other Ambulatory Visit: Payer: Self-pay

## 2019-09-24 ENCOUNTER — Encounter: Payer: Self-pay | Admitting: Physical Therapy

## 2019-09-24 ENCOUNTER — Ambulatory Visit: Payer: Medicare Other | Admitting: Physical Therapy

## 2019-09-24 ENCOUNTER — Other Ambulatory Visit: Payer: Self-pay | Admitting: Family Medicine

## 2019-09-24 DIAGNOSIS — M25561 Pain in right knee: Secondary | ICD-10-CM

## 2019-09-24 DIAGNOSIS — G8929 Other chronic pain: Secondary | ICD-10-CM

## 2019-09-24 DIAGNOSIS — M5442 Lumbago with sciatica, left side: Secondary | ICD-10-CM

## 2019-09-24 DIAGNOSIS — M6281 Muscle weakness (generalized): Secondary | ICD-10-CM | POA: Diagnosis not present

## 2019-09-24 DIAGNOSIS — R2689 Other abnormalities of gait and mobility: Secondary | ICD-10-CM | POA: Diagnosis not present

## 2019-09-24 DIAGNOSIS — M5441 Lumbago with sciatica, right side: Secondary | ICD-10-CM

## 2019-09-24 NOTE — Therapy (Signed)
[place], [place], [address] Phone: [phone]   Fax:  [phone]  Physical Therapy Treatment  Patient Details  Name: [name] MRN: [id number] Date of Birth: 14-Feb-1946 Referring Provider (PT): Levy Pupa, [place]   Encounter Date: 09/24/2019  PT End of Session - 09/24/19 1308    Visit Number  5    Number of Visits  12    Date for PT Re-Evaluation  10/09/19    PT Start Time  [id number]    PT Stop Time  1226    PT Time Calculation (min)  41 min    Activity Tolerance  Patient tolerated treatment well    Behavior During Therapy  [facility] for tasks assessed/performed       Past Medical History:  Diagnosis Date  . Allergy   . Anemia   . Anxiety    past hx   . Arthritis   . Asthma   . Atrial flutter ([place])    past history- not current  . CAD (coronary artery disease) CARDIOLOGIST--  DR Angelena Form   mild non-obstructive cad  . Cancer ([place])    right  . Cataract    bilaterally removed   . Chronic constipation   . Chronic kidney disease    interstitial cystitis  . COPD (chronic obstructive pulmonary disease) ([place])   . Depression    past hx   . Family history of malignant hyperthermia    father had this  . Fibromyalgia   . Frequency of urination   . GERD (gastroesophageal reflux disease)   . H/O hiatal hernia   . History of basal cell carcinoma excision    X2  . History of breast cancer ONCOLOGIST-- DR Jana Hakim---  NO RECURRANCE   DX 07/2012;  LOW GRADE DCIS  ER+PR+  ----  S/P RIGHT LUMPECTOMY WITH NEGATIVE MARGINS/   RADIATION ENDED 11/2012  . History of chronic bronchitis   . History of colonic polyps   . History of tachycardia    CONTROLLED  WITH ATENOLOL  . Hyperlipidemia   . Hypertension   . Neuromuscular disorder (HCC)    fibromyalgia  . Osteoporosis 01/2019   T score -2.2 stable/improved from prior study  . Pelvic pain   . Personal history of radiation therapy   . S/P radiation  therapy 11/12/12 - 12/05/12   right Breast  . Sepsis ([place]) 2014   from UTI   . Sinus headache   . Urgency of urination     Past Surgical History:  Procedure Laterality Date  . [place] STUDY N/A 04/03/2016   Procedure: [place] STUDY;  Surgeon: Manus Gunning, MD;  Location: [facility];  Service: Gastroenterology;  Laterality: N/A;  . BREAST LUMPECTOMY Right 10-11-2012   W/ SLN BX  . CARDIAC CATHETERIZATION  09-13-2007  DR Lia Foyer   WELL-PRESERVED LVF/  DIFFUSE SCATTERED CORONARY CALCIFACATION AND ATHEROSCLEROSIS WITHOUT OBSTRUCTION  . CARDIAC CATHETERIZATION  08-04-2010  DR Angelena Form   NON-OBSTRUCTIVE CAD/  pLAD 40%/  oLAD 30%/  mLAD 30%/  pRCA 30%/  EF 60%  . CARDIOVASCULAR STRESS TEST  06-18-2012  DR McALHANY   LOW RISK NUCLEAR STUDY/  SMALL FIXED AREA OF MODERATELY DECREASED UPTAKE IN ANTEROSEPTAL WALL WHICH MAY BE ARTIFACTUAL/  NO ISCHEMIA/  EF 68%  . COLONOSCOPY  09-29-2010  . CYSTOSCOPY    . CYSTOSCOPY WITH HYDRODISTENSION AND BIOPSY N/A 03/06/2014   Procedure: CYSTOSCOPY/HYDRODISTENSION/ INSTILATION OF MARCAINE AND PYRIDIUM;  Surgeon: Shane Crutch  Gaynelle Arabian, MD;  Location: [facility];  Service: Urology;  Laterality: N/A;  . ESOPHAGEAL MANOMETRY N/A 04/03/2016   Procedure: ESOPHAGEAL MANOMETRY (EM);  Surgeon: Manus Gunning, MD;  [facility];  Service: Gastroenterology;  Laterality: N/A;  . EXTRACORPOREAL SHOCK WAVE LITHOTRIPSY Left 02/06/2019   Procedure: EXTRACORPOREAL SHOCK WAVE LITHOTRIPSY (ESWL);  Surgeon: Kathie Rhodes, MD;  [facility]: [facility];  Service: Urology;  Laterality: Left;  . NASAL SINUS SURGERY  1985  . ORIF RIGHT ANKLE  FX  2006  . POLYPECTOMY    . REMOVAL VOCAL CORD CYST  FEB 2014  . RIGHT BREAST BX  08-23-2012  . RIGHT HAND SURGERY  X3  LAST ONE 2009   INCLUDES  ORIF RIGHT 5TH FINGER AND REVISION TWICE  . SKIN CANCER EXCISION    . TONSILLECTOMY AND ADENOIDECTOMY  AGE 73  . TOTAL ABDOMINAL HYSTERECTOMY W/ BILATERAL  SALPINGOOPHORECTOMY  1982   W/  APPENDECTOMY  . TRANSTHORACIC ECHOCARDIOGRAM  06-24-2012   GRADE I DIASTOLIC DYSFUNCTION/  EF 55-60%/  MILD MR  . UPPER GASTROINTESTINAL ENDOSCOPY      There were no vitals filed for this visit.  Subjective Assessment - 09/24/19 1150    Subjective  doing okay; having issues with neuropathy and sees neurologist tomorrow.  back is doing well.    Limitations  Walking    How long can you walk comfortably?  15 min    Patient Stated Goals  walk without knee or back hurting, get up off floor by myself    Currently in Pain?  No/denies    Pain Onset  More than a month ago    Pain Onset  More than a month ago         [facility] PT Assessment - 09/24/19 1151      Assessment   Medical Diagnosis  LBP, R knee pain    Referring Provider (PT)  Levy Pupa, PA                   [facility] Adult PT Treatment/Exercise - 09/24/19 1151      Lumbar Exercises: Stretches   Double Knee to Chest Stretch  3 reps;20 seconds    Double Knee to Chest Stretch Limitations  with rocking    ITB Stretch  Right;Left;3 reps;20 seconds    Other Lumbar Stretch Exercise  book openers 5x15 sec bil      Lumbar Exercises: Aerobic   Nustep  L5 x 8 min      Lumbar Exercises: Standing   Row  Both;20 reps;Theraband    Theraband Level (Row)  Level 3 (Green)    Other Standing Lumbar Exercises  horizontal abduction x 20 reps; green      Lumbar Exercises: Sidelying   Clam  Both;15 reps;2 seconds    Clam Limitations  red theraband                  PT Long Term Goals - 09/18/19 1251      PT LONG TERM GOAL #1   Title  independent with HEP    Status  New    Target Date  10/09/19      PT LONG TERM GOAL #2   Title  FOTO score improved to </= 53% limited for improved function    Status  New    Target Date  10/09/19      PT LONG TERM GOAL #3   Title  perform thoracolumbar flexion without increase in pain  Status  New    Target Date  10/09/19      PT LONG TERM  GOAL #4   Title  improve 5x STS to </= 17 sec for improved functional strength    Status  New    Target Date  10/09/19      PT LONG TERM GOAL #5   Title  verbalize compliance with community fitness and plans to continue at d/c    Status  New    Target Date  10/09/19            Plan - 09/24/19 1308    Clinical Impression Statement  Pt continues to do well with PT, and overall reports pain decreased and very manageable.  [facility], and may be ready for d/c at next visit.    Personal Factors and Comorbidities  Comorbidity 3+;Past/Current Experience;Time since onset of injury/illness/exacerbation    Comorbidities  osteoporosis, HTN, fibromyalgia, COPD, CAD, asthma    Examination-Activity Limitations  Sit;Stand;Locomotion Level    Examination-Participation Restrictions  Cleaning;Driving;Yard Work    Stability/Clinical Decision Making  Stable/Uncomplicated    Rehab Potential  Good    PT Frequency  2x / week    PT Duration  6 weeks    PT Treatment/Interventions  ADLs/Self Care Home Management;Cryotherapy;Electrical Stimulation;Moist Heat;Therapeutic exercise;Therapeutic activities;Functional mobility training;Stair training;Gait training;Neuromuscular re-education;Patient/family education;Manual techniques;Taping;Dry needling;Passive range of motion    PT Next Visit Plan  discuss if pt ready for d/c, continue as indicated    PT Home Exercise Plan  Access Code: [email or web address]    Consulted and Agree with Plan of Care  Patient       Patient will benefit from skilled therapeutic intervention in order to improve the following deficits and impairments:  Pain, Postural dysfunction, Decreased range of motion, Impaired flexibility, Decreased mobility, Decreased strength  Visit Diagnosis: Chronic bilateral low back pain with bilateral sciatica  Chronic pain of right knee  Other abnormalities of gait and mobility  Muscle weakness (generalized)     Problem  List Patient Active Problem List   Diagnosis Date Noted  . Kidney stone 02/26/2019  . Recurrent sinusitis 02/25/2019  . Hx of colonic polyp 02/25/2019  . DDD (degenerative disc disease), cervical 09/17/2018  . Degenerative scoliosis 04/22/2018  . Primary insomnia 06/25/2017  . Chronic interstitial cystitis 02/01/2017  . Cough, persistent 01/09/2017  . Hand pain, right 07/20/2016  . Deviated septum 05/12/2016  . Nasal turbinate hypertrophy 05/12/2016  . Dysphagia   . Allergic rhinitis 01/25/2016  . Other and combined forms of senile cataract 07/15/2015  . Dyspnea   . Left hip pain 04/30/2015  . Sciatica 04/30/2015  . Knee pain, right 04/30/2015  . Bilateral carpal tunnel syndrome 03/04/2015  . Hyperlipidemia 03/01/2015  . Pulmonary nodules 02/12/2015  . External hemorrhoids 02/05/2015  . Chronic night sweats 01/08/2015  . Atypical chest pain 01/01/2015  . Renal insufficiency 07/16/2014  . Memory loss 05/22/2014  . Peripheral neuropathy 05/22/2014  . Headache 10/13/2013  . Pain, joint, multiple sites 08/18/2013  . ANA positive 07/29/2013  . Depression 02/05/2013  . Family history of malignant hyperthermia 02/05/2013  . Malignant neoplasm of lower-outer quadrant of right breast of female, estrogen receptor positive ([place]) 01/03/2013  . Splenic lesion 09/02/2012  . Asthma, allergic 08/05/2012  . GERD (gastroesophageal reflux disease) 06/03/2012  . Edema 04/08/2012  . Stricture and stenosis of esophagus 10/19/2011  . Constipation - functional 07/21/2011  . Nonspecific (abnormal) findings on radiological  and other examination of biliary tract 07/21/2011  . Osteoporosis 04/17/2011  . FIBROCYSTIC BREAST DISEASE, HX OF 10/18/2010  . Essential hypertension 08/25/2010  . CAD in native artery 08/25/2010  . TOBACCO ABUSE, HX OF 08/25/2010  . GENERALIZED ANXIETY DISORDER 08/03/2010  . FIBROMYALGIA 01/11/2010      Laureen Abrahams, PT, DPT 09/24/19 1:10 PM    [facility] [place], [place], [address] Phone: [phone]   Fax:  [phone]  Name: [name] MRN: [id number] Date of Birth: Oct 05, 1946

## 2019-09-25 ENCOUNTER — Ambulatory Visit: Payer: Medicare Other | Admitting: Family Medicine

## 2019-09-25 VITALS — BP 122/73 | HR 73 | Temp 97.1°F | Wt 131.4 lb

## 2019-09-25 DIAGNOSIS — G6289 Other specified polyneuropathies: Secondary | ICD-10-CM | POA: Diagnosis not present

## 2019-09-25 DIAGNOSIS — G8929 Other chronic pain: Secondary | ICD-10-CM | POA: Diagnosis not present

## 2019-09-25 DIAGNOSIS — G2581 Restless legs syndrome: Secondary | ICD-10-CM

## 2019-09-25 MED ORDER — ROPINIROLE HCL 0.25 MG PO TABS: 0.2500 mg | ORAL_TABLET | Freq: Three times a day (TID) | ORAL | 5 refills | Status: DC

## 2019-09-25 NOTE — Patient Instructions (Signed)
We will try ropinirole 0.25mg  at night for concerns of restless legs, you may follow up with pain management for continued refills if this helps. Speak to pain management regarding need for another NCS.   Follow up closely with PCP and pain management for concerns of chronic pain, fibromyalgia, and sleep  Follow up as needed    Restless Legs Syndrome Restless legs syndrome is a condition that causes uncomfortable feelings or sensations in the legs, especially while sitting or lying down. The sensations usually cause an overwhelming urge to move the legs. The arms can also sometimes be affected. The condition can range from mild to severe. The symptoms often interfere with a person's ability to sleep. What are the causes? The cause of this condition is not known. What increases the risk? The following factors may make you more likely to develop this condition:  Being older than 50.  Pregnancy.  Being a woman. In general, the condition is more common in women than in men.  A family history of the condition.  Having iron deficiency.  Overuse of caffeine, nicotine, or alcohol.  Certain medical conditions, such as kidney disease, Parkinson's disease, or nerve damage.  Certain medicines, such as those for high blood pressure, nausea, colds, allergies, depression, and some heart conditions. What are the signs or symptoms? The main symptom of this condition is uncomfortable sensations in the legs, such as:  Pulling.  Tingling.  Prickling.  Throbbing.  Crawling.  Burning. Usually, the sensations:  Affect both sides of the body.  Are worse when you sit or lie down.  Are worse at night. These may wake you up or make it difficult to fall asleep.  Make you have a strong urge to move your legs.  Are temporarily relieved by moving your legs. The arms can also be affected, but this is rare. People who have this condition often have tiredness during the day because of their  lack of sleep at night. How is this diagnosed? This condition may be diagnosed based on:  Your symptoms.  Blood tests. In some cases, you may be monitored in a sleep lab by a specialist (a sleep study). This can detect any disruptions in your sleep. How is this treated? This condition is treated by managing the symptoms. This may include:  Lifestyle changes, such as exercising, using relaxation techniques, and avoiding caffeine, alcohol, or tobacco.  Medicines. Anti-seizure medicines may be tried first. Follow these instructions at home:     General instructions  Take over-the-counter and prescription medicines only as told by your health care provider.  Use methods to help relieve the uncomfortable sensations, such as: ? Massaging your legs. ? Walking or stretching. ? Taking a cold or hot bath.  Keep all follow-up visits as told by your health care provider. This is important. Lifestyle  Practice good sleep habits. For example, go to bed and get up at the same time every day. Most adults should get 7-9 hours of sleep each night.  Exercise regularly. Try to get at least 30 minutes of exercise most days of the week.  Practice ways of relaxing, such as yoga or meditation.  Avoid caffeine and alcohol.  Do not use any products that contain nicotine or tobacco, such as cigarettes and e-cigarettes. If you need help quitting, ask your health care provider. Contact a health care provider if:  Your symptoms get worse or they do not improve with treatment. Summary  Restless legs syndrome is a condition that causes  uncomfortable feelings or sensations in the legs, especially while sitting or lying down.  The symptoms often interfere with a person's ability to sleep.  This condition is treated by managing the symptoms. You may need to make lifestyle changes or take medicines. This information is not intended to replace advice given to you by your health care provider. Make sure  you discuss any questions you have with your health care provider. Document Released: 12/01/2002 Document Revised: 12/31/2017 Document Reviewed: 12/31/2017 Elsevier Patient Education  [place].   Chronic Pain, Adult Chronic pain is a type of pain that lasts or keeps coming back (recurs) for at least six months. You may have chronic headaches, abdominal pain, or body pain. Chronic pain may be related to an illness, such as fibromyalgia or complex regional pain syndrome. Sometimes the cause of chronic pain is not known. Chronic pain can make it hard for you to do daily activities. If not treated, chronic pain can lead to other health problems, including anxiety and depression. Treatment depends on the cause and severity of your pain. You may need to work with a pain specialist to come up with a treatment plan. The plan may include medicine, counseling, and physical therapy. Many people benefit from a combination of two or more types of treatment to control their pain. Follow these instructions at home: Lifestyle  Consider keeping a pain diary to share with your health care providers.  Consider talking with a mental health care provider (psychologist) about how to cope with chronic pain.  Consider joining a chronic pain support group.  Try to control or lower your stress levels. Talk to your health care provider about strategies to do this. General instructions   Take over-the-counter and prescription medicines only as told by your health care provider.  Follow your treatment plan as told by your health care provider. This may include: ? Gentle, regular exercise. ? Eating a healthy diet that includes foods such as vegetables, fruits, fish, and lean meats. ? Cognitive or behavioral therapy. ? Working with a Community education officer. ? Meditation or yoga. ? Acupuncture or massage therapy. ? Aroma, color, light, or sound therapy. ? Local electrical stimulation. ? Shots (injections) of  numbing or pain-relieving medicines into the spine or the area of pain.  Check your pain level as told by your health care provider. Ask your health care provider if you should use a pain scale.  Learn as much as you can about how to manage your chronic pain. Ask your health care provider if an intensive pain rehabilitation program or a chronic pain specialist would be helpful.  Keep all follow-up visits as told by your health care provider. This is important. Contact a health care provider if:  Your pain gets worse.  You have new pain.  You have trouble sleeping.  You have trouble doing your normal activities.  Your pain is not controlled with treatment.  Your have side effects from pain medicine.  You feel weak. Get help right away if:  You lose feeling or have numbness in your body.  You lose control of bowel or bladder function.  Your pain suddenly gets much worse.  You develop shaking or chills.  You develop confusion.  You develop chest pain.  You have trouble breathing or shortness of breath.  You pass out.  You have thoughts about hurting yourself or others. This information is not intended to replace advice given to you by your health care provider. Make sure you discuss  any questions you have with your health care provider. Document Released: 09/02/2002 Document Revised: 11/23/2017 Document Reviewed: 05/30/2016 Elsevier Patient Education  2020 [employer].

## 2019-09-25 NOTE — Progress Notes (Addendum)
PATIENT: [name] DOB: 10-Jan-1946  REASON FOR VISIT: follow up HISTORY FROM: patient  Chief Complaint  Patient presents with   Follow-up    Follow up room 2 pt alone pt having burning and stabbing in both feet and both legs pt goes to pain managment for her back     HISTORY OF PRESENT ILLNESS: Today 09/29/19 [name] is a 73 y.o. female here today for follow up. She was seen last by Dr Jaynee Eagles for multiple complaints including memory loss, balance issues, vertigo, falls, tremor, fibromyalgia, bilateral CTS, snoring and morning headaches.  Formal neurocognitive eval revealed normal cognitive functioning. She is on multiple medications that can contribute to memory concerns. Stress/depression may also be a factor. She has chronic pain. She is participating in PT. She has had 1-2 falls over the past 2 years. She uses a cane most of the time. She has neuropathies. She does have tingling in bilateral legs and feet. She feels that her legs ache more at night when she gets in bed. She feels that she needs to keep moving her legs. She is on gabapentin 300mg  BID and duloxetine 120mg  daily. She is followed by pain management. She reports that she has scoliosis and has been advised to consider surgical fixation. She is hesitant to proceed. She is taking hydrocodone and tramadol as well. She is using dental appliance for mild OSA.    HISTORY: (copied from  note on 09/10/2017)  Interval history: She is having difficulty sleeping. Her legs ache. May be her Fibromyalgia. She is very fatigued. She has constipation. Sheis really fatigued. She can;t walk up stairs. She is in PT. She is having SOB. She has a lot of weakness, chronic pain. Right eye is drooping more. Extreme fatigue especially at the end of the day. She has headaches every day, she has a general headache, the left side of her skull hurts more. She has new ptosis and fatigue and new onset headache.    Interval history 03/07/2017: She has  severe pain. Since her 66s she has had pain she hurts all over even to touch the skin. It hurts to touch her skin. It is hurting if she touches any part of her body with the other part. Dr. Maudie Mercury is working on that. Headaches come from the neck. She has "bulging disks". She hurts badly in the neck. She has daily headaches. She has neck pain. She has chronic pain all over. She is frustrated.   Intreval history: Snoring wakes her up, she snore heavilty, she has morning headache, dry mouth. Daytime fatigue. She did vestibular therapy for her chronic dizziness. Discussed her normal neurocognitive testing which did not show neurodegenerative disease.   REFERRING DIAGNOSIS:  Memory loss  FINAL DIAGNOSES:  Normal Cognitive Functioning Fibromyalgia Unspecified Depressive Disorder (well-controlled) Unspecified Anxiety Disorder (well-controlled)  RECOMMENDATIONS: ? Follow-up with Dr. Jaynee Eagles is recommended as needed.  ? No treatment with nootropic medication warranted.  ? Continued follow-up regarding her psychopharmacological medications is warranted.  ? Patient may wish to consider a medication review with one of her care providers to help determine if any changes would be appropriate (and not medically contraindicated) that could help optimize cognition.  ? Follow-up, as needed, with the [facility]) is recommended for routine appointments, educational purposes, psychosocial concerns, and brief neurocognitive testing as necessary. ? Neuropsychological re-evaluation is recommended as needed to monitor treatment efficacy, to assist with the management of the patient (start or continue rehab  or pharmacological therapy), to determine any clinical and functional significance of brain abnormality over time, as well as to document any potential improvement or decline in cognitive functioning. Lastly, any follow-up testing will help delineate the specific cognitive basis of any  new functional complaints.   The following are several strategies that may help compensate for any real or perceived cognitive deficits. These are to be used as applicable. I will also provide additional information with strategies for memory loss and attention issues. ? Performance will generally be best in a structured, routine, and familiar environment, as opposed to situations involving complex problems.  ? To the extent possible, multitasking should be avoided. And if there are difficulties in organization and planning, maintaining a daily organizer to help keep track of important appointments and information may be beneficial.  ? Memory problems may at least be minimally addressed using compensatory strategies such as the use of a daily schedule to follow, memos, portable recorder, a centrally located bulletin board, or memory notebook.  ? To aid in managing perceived problems with attention, she may consider using some of the following strategies: o The patient should simplify tasks. There may be a need to break overly complex activities into simple step-by-step tasks, keep these steps written down in a note book and then check them off as they are completed which will help to stay on task and make sure the whole task is finished.  o The patient should set deadlines for everything, even for seemingly small tasks, prioritize time-sensitive tasks and write down every assignment, message, or important thought. o The patient is encouraged to use timers and alarms to stay on track and take breaks at regular intervals. Avoid piles of paperwork or procrastination by dealing with each item as it comes in.  Mrs. Nordine is encouraged to attend to lifestyle factors for brain health (e.g., regular physical exercise, good nutrition habits, regular participation in cognitively-stimulating activities, and general stress management techniques), which are likely to have benefits for both emotional adjustment and  cognition. In fact, in addition to promoting general good health, regular exercise incorporating aerobic activities (e.g., brisk walking, jogging, bicycling, etc.) has been demonstrated to be a very effective treatment for depression and stress, with similar efficacy rates to both antidepressant medication and psychotherapy. And for those with orthopedic issues, water aerobics may be particularly beneficial.  [id number] Banskyis a 73 y.o.femalehere as a referral from Dr. Wonda Olds memory loss. PMHx right breast cancer s/p radiation therapy 2013, chronic pain and Fibromyalgia, HTN, COPD, arthritis, fatigue, hyperlipidemia, insomnia, leg and back pain and lumbar radiculopathy, anxiety, vertigo, nutcracker esophagus, coronary artery disease. She has had memory loss a year ago, she cant pull out words, words just don;t come, difficulty spelling, she has started forgetting how to get places and gone up the wrong ramp, getting lost. Slowly progressive. She fell and hit her head 31/2 weeks ago and hit it really hard. The first time she fell she must missed the seat. The second time she just fell, doesn't know if the hip went out, she was not clear headed. She discusses balance problems, not doing yoga, she has hip and back problems.   Memory: Memory is slowly progressive. She is getting very anxious about it. Difficulty carrying on a conversation. There is also some confusion. Aunts with dementia. Difficulty spelling. She may have had neurocognitive testing in the past as she remembers spo maybe she has had complaints for longer. She forgets things, can;t get words out. No Fhx  memory loss.   Vertigo: the morning before she got up she felt dizzy. Balance problems, she has had difficulty with vision since cataract surgery. The morning before she fell. She got up and opened her eyes and had some room spinning, if she moved the room was spinning. Laying in bed the light was spinning. She has had vertigo before, she  was seen by therapy in the past. She was diagnosed with BPPV and after they did exercise. Moving eyes to right bothers her.   Reviewed notes, labs and imaging from outside physicians, which showed:  B12 and TSH normal 02/2015. Hgba1c 5.8 02/2015.   MRI of the brain 09/2015: personally reviewed images and agree with the following. Would also add some generalized mild atrophy more pronounced in the mesial temporal areas.   FINDINGS: There is no evidence of acute infarct, intracranial hemorrhage, mass, midline shift, or extra-axial fluid collection. Ventricles and sulci are within normal limits for age. Punctate foci of T2 hyperintensity are again seen scattered throughout the subcortical and deep cerebral white matter bilaterally, not significantly changed and nonspecific but compatible with mild chronic small vessel ischemic disease.  Prior bilateral cataract extraction is noted. Paranasal sinuses and mastoid air cells are clear. Major intracranial vascular flow voids are preserved.  IMPRESSION: 1. No acute intracranial abnormality or mass. 2. Mild chronic small vessel ischemic disease.  Reviewed notes from primary care. 4 months ago developed numbness of hands. Fingers of both hands are equally affected. Also has a toes. Says they're always cold. She still has strength in the hands. Has a history of bulging disc. She has seen a neurologist in the past Dr. Sabra Heck and was diagnosed with CTS. She says she clenches her hands. Says her memory is terrible. Also reports some memory loss, there was a full where she hurt her shoulder, had vertigo hit her head, noticed some memory loss even before this fall. She tried bilateral wrist immobilizers at nighttime. Cold misdiagnosed for Raynaud's. Asked for evaluation of bilateral hand numbness and memory loss. Scheduled for vestibular therapy for vertigo. Status post fall asked to start using a cane to ambulate.    REVIEW OF SYSTEMS: Out of a  complete 14 system review of symptoms, the patient complains only of the following symptoms, chronic pain, insomnia, back pain, tremor, hip pain, memory loss, leg pain, numbness, and all other reviewed systems are negative.  ALLERGIES: Allergies  Allergen Reactions   Clindamycin Hcl Shortness Of Breath and Rash   Penicillins Anaphylaxis   Prednisone Shortness Of Breath and Rash   Baclofen Other (See Comments) and Rash   Cortisone Other (See Comments)   Lincomycin Other (See Comments)   Codeine Hives and Other (See Comments)    headache   Erythromycin Base Other (See Comments)    other   Fluzone [Flu Virus Vaccine] Other (See Comments)    Local reaction at the site   Haemophilus Influenzae Other (See Comments)    Local reaction at the site Local reaction at the site   Latex Hives   Pentazocine Lactate Other (See Comments)    HALLUCINATION   Pneumococcal Vaccine Polyvalent Hives, Swelling and Other (See Comments)    REACTION: redness, swelling, and hives at injection site   Tamoxifen Nausea And Vomiting and Other (See Comments)    HEADACHE    HOME MEDICATIONS: Outpatient Medications Prior to Visit  Medication Sig Dispense Refill   acetaminophen (TYLENOL) 500 MG tablet Take 1,000 mg by mouth every 6 (six) hours as  needed for mild pain or headache.      albuterol (PROVENTIL HFA;VENTOLIN HFA) 108 (90 BASE) MCG/ACT inhaler Inhale 2 puffs into the lungs every 4 (four) hours as needed. 1 Inhaler 5   amLODipine (NORVASC) 5 MG tablet amlodipine 5 mg tablet     aspirin EC 81 MG tablet Take 1 tablet (81 mg total) by mouth daily. 90 tablet 3   atenolol (TENORMIN) 25 MG tablet TAKE 1 TABLET BY MOUTH THREE TIMES DAILY BEFORE MEAL(S) 270 tablet 2   denosumab (PROLIA) 60 MG/ML SOSY injection Inject 60 mg into the skin every 6 (six) months.     DULoxetine (CYMBALTA) 60 MG capsule Take 2 capsules (120 mg total) by mouth daily. 90 capsule 1   Estradiol 10 MCG TABS vaginal  tablet Place 1 tablet (10 mcg total) vaginally 2 (two) times a week. 8 tablet 11   Evolocumab (REPATHA SURECLICK) [id number] MG/ML SOAJ Inject 140 mg into the skin every 14 (fourteen) days. 2 pen 11   fluticasone (FLONASE) 50 MCG/ACT nasal spray Place 2 sprays into both nostrils daily. 16 g 5   furosemide (LASIX) 20 MG tablet Take 1 tablet (20 mg total) by mouth as needed for edema. 90 tablet 3   gabapentin (NEURONTIN) 300 MG capsule TAKE 1 CAPSULE (300 MG TOTAL) BY MOUTH TWICE DAILY 180 capsule 0   HYDROcodone-acetaminophen (NORCO/VICODIN) 5-325 MG tablet Take 1 tablet by mouth every 6 (six) hours as needed for moderate pain.     linaclotide (LINZESS) 145 MCG CAPS capsule Take 1 capsule (145 mcg total) by mouth daily before breakfast. 30 capsule 5   loratadine (CLARITIN) 10 MG tablet Take 1 tablet (10 mg total) by mouth daily. 30 tablet 11   losartan (COZAAR) 100 MG tablet Take 1 tablet by mouth once daily 90 tablet 1   mupirocin ointment (BACTROBAN) 2 % Apply thin film twice daily. 22 g 0   nitroGLYCERIN (NITROSTAT) 0.4 MG SL tablet Place 1 tablet (0.4 mg total) under the tongue every 5 (five) minutes as needed for chest pain. 25 tablet 1   omeprazole (PRILOSEC) 40 MG capsule Take 1 capsule (40 mg total) by mouth 2 (two) times daily. 90 capsule 1   pentosan polysulfate (ELMIRON) 100 MG capsule Take 1 capsule (100 mg total) by mouth 3 (three) times daily. Reported on 01/05/2016 (Patient taking differently: Take 100 mg by mouth 3 (three) times daily with meals as needed. Reported on 01/05/2016) 90 capsule 11   traMADol (ULTRAM) 50 MG tablet Take 1-2 tablets (50-100 mg total) by mouth 3 (three) times daily as needed for moderate pain. (Patient taking differently: Take 50-100 mg by mouth 3 (three) times daily as needed for moderate pain (pt uses TID). ) 180 tablet 0   traZODone (DESYREL) 100 MG tablet Take 1 tablet (100 mg total) by mouth at bedtime. 90 tablet 1   traZODone (DESYREL) 100 MG tablet  TAKE 1 TABLET BY MOUTH AT BEDTIME AS NEEDED     ondansetron (ZOFRAN) 4 MG tablet TAKE 1 TABLET BY MOUTH EVERY 8 HOURS AS NEEDED     traMADol (ULTRAM) 50 MG tablet tramadol 50 mg tablet  TAKE 1 TABLET BY MOUTH THREE TIMES DAILY AS NEEDED     amLODipine (NORVASC) 5 MG tablet Take 1 tablet (5 mg total) by mouth daily. (Patient taking differently: Take 5 mg by mouth every evening. ) 90 tablet 3   atenolol (TENORMIN) 50 MG tablet Take by mouth.     diclofenac sodium (VOLTAREN)  1 % GEL APPLY 2 GRAMS TO THE AFFECTED AREA(S) BY TOPICAL ROUTE 4 TIMES PER DAY     DULoxetine (CYMBALTA) 60 MG capsule Take by mouth.     Estradiol 10 MCG TABS vaginal tablet Place vaginally.     Evolocumab (REPATHA SURECLICK) [id number] MG/ML SOAJ INJECT 1 SUBCUTANEOUSLY EVERY 14 DAYS     furosemide (LASIX) 20 MG tablet TAKE 1 TABLET BY MOUTH IN THE MORNING     gabapentin (NEURONTIN) 300 MG capsule TAKE 1 CAPSULE BY MOUTH THREE TIMES DAILY     HYDROcodone-acetaminophen (NORCO/VICODIN) 5-325 MG tablet hydrocodone 5 mg-acetaminophen 325 mg tablet  TAKE 1 TABLET BY MOUTH THREE TIMES DAILY AS NEEDED     linaclotide (LINZESS) 145 MCG CAPS capsule Linzess 145 mcg capsule     losartan (COZAAR) 100 MG tablet losartan 100 mg tablet     meloxicam (MOBIC) 15 MG tablet TAKE 1 TABLET BY MOUTH ONCE DAILY AS NEEDED     methocarbamol (ROBAXIN) 500 MG tablet TAKE 1 TABLET BY MOUTH EVERY 8 HOURS AS NEEDED     omeprazole (PRILOSEC) 40 MG capsule Take 1 capsule by mouth once daily     Facility-Administered Medications Prior to Visit  Medication Dose Route Frequency Provider Last Rate Last Dose   bupivacaine (MARCAINE) 0.5 % 10 mL, triamcinolone acetonide (KENALOG-40) 40 mg injection   Subcutaneous Once Carolan Clines, MD       bupivacaine (MARCAINE) 0.5 % 15 mL, phenazopyridine (PYRIDIUM) 400 mg bladder mixture   Bladder Instillation Once Carolan Clines, MD        PAST MEDICAL HISTORY: Past Medical History:  Diagnosis  Date   Allergy    Anemia    Anxiety    past hx    Arthritis    Asthma    Atrial flutter (HCC)    past history- not current   CAD (coronary artery disease) CARDIOLOGIST--  DR Angelena Form   mild non-obstructive cad   Cancer (HCC)    right   Cataract    bilaterally removed    Chronic constipation    Chronic kidney disease    interstitial cystitis   COPD (chronic obstructive pulmonary disease) (HCC)    Depression    past hx    Family history of malignant hyperthermia    father had this   Fibromyalgia    Frequency of urination    GERD (gastroesophageal reflux disease)    H/O hiatal hernia    History of basal cell carcinoma excision    X2   History of breast cancer ONCOLOGIST-- DR Jana Hakim---  NO RECURRANCE   DX 07/2012;  LOW GRADE DCIS  ER+PR+  ----  S/P RIGHT LUMPECTOMY WITH NEGATIVE MARGINS/   RADIATION ENDED 11/2012   History of chronic bronchitis    History of colonic polyps    History of tachycardia    CONTROLLED  WITH ATENOLOL   Hyperlipidemia    Hypertension    Neuromuscular disorder (HCC)    fibromyalgia   Osteoporosis 01/2019   T score -2.2 stable/improved from prior study   Pelvic pain    Personal history of radiation therapy    S/P radiation therapy 11/12/12 - 12/05/12   right Breast   Sepsis ([place]) 2014   from UTI    Sinus headache    Urgency of urination     PAST SURGICAL HISTORY: Past Surgical History:  Procedure Laterality Date   35 HOUR [place] STUDY N/A 04/03/2016   Procedure: [place] STUDY;  Surgeon: Renelda Loma  Havery Moros, MD;  Location: [facility];  Service: Gastroenterology;  Laterality: N/A;   BREAST LUMPECTOMY Right 10-11-2012   W/ SLN BX   CARDIAC CATHETERIZATION  09-13-2007  DR Lia Foyer   WELL-PRESERVED LVF/  DIFFUSE SCATTERED CORONARY CALCIFACATION AND ATHEROSCLEROSIS WITHOUT OBSTRUCTION   CARDIAC CATHETERIZATION  08-04-2010  DR MCALHANY   NON-OBSTRUCTIVE CAD/  pLAD 40%/  oLAD 30%/  mLAD 30%/  pRCA 30%/   EF 60%   CARDIOVASCULAR STRESS TEST  06-18-2012  DR McALHANY   LOW RISK NUCLEAR STUDY/  SMALL FIXED AREA OF MODERATELY DECREASED UPTAKE IN ANTEROSEPTAL WALL WHICH MAY BE ARTIFACTUAL/  NO ISCHEMIA/  EF 68%   COLONOSCOPY  09-29-2010   CYSTOSCOPY     CYSTOSCOPY WITH HYDRODISTENSION AND BIOPSY N/A 03/06/2014   Procedure: CYSTOSCOPY/HYDRODISTENSION/ INSTILATION OF MARCAINE AND PYRIDIUM;  Surgeon: Ailene Rud, MD;  Location: [facility];  Service: Urology;  Laterality: N/A;   ESOPHAGEAL MANOMETRY N/A 04/03/2016   Procedure: ESOPHAGEAL MANOMETRY (EM);  Surgeon: Manus Gunning, MD;  Location: [facility];  Service: Gastroenterology;  Laterality: N/A;   EXTRACORPOREAL SHOCK WAVE LITHOTRIPSY Left 02/06/2019   Procedure: EXTRACORPOREAL SHOCK WAVE LITHOTRIPSY (ESWL);  Surgeon: Kathie Rhodes, MD;  Location: [facility];  Service: Urology;  Laterality: Left;   NASAL SINUS SURGERY  1985   ORIF RIGHT ANKLE  FX  2006   POLYPECTOMY     REMOVAL VOCAL CORD CYST  FEB 2014   RIGHT BREAST BX  08-23-2012   RIGHT HAND SURGERY  X3  LAST ONE 2009   INCLUDES  ORIF RIGHT 5TH FINGER AND REVISION TWICE   SKIN CANCER EXCISION     TONSILLECTOMY AND ADENOIDECTOMY  AGE 21   TOTAL ABDOMINAL HYSTERECTOMY W/ BILATERAL SALPINGOOPHORECTOMY  1982   W/  APPENDECTOMY   TRANSTHORACIC ECHOCARDIOGRAM  06-24-2012   GRADE I DIASTOLIC DYSFUNCTION/  EF 55-60%/  MILD MR   UPPER GASTROINTESTINAL ENDOSCOPY      FAMILY HISTORY: Family History  Problem Relation Age of Onset   Rectal cancer Mother    Colon cancer Mother    Pancreatic cancer Mother    Diabetes Mother    Breast cancer Mother 70   Breast cancer Maternal Aunt        breast   Irritable bowel syndrome Son    Heart disease Son        CAD, MV replacement   Allergic Disorder Daughter    Colon cancer Father    Colon polyps Father    Diabetes Father    Stroke Father    Heart disease Father        CHF    Hyperlipidemia Father    Hypertension Father    Arthritis Father    Breast cancer Paternal Aunt    Breast cancer Paternal Aunt    Arthritis Paternal Uncle    Allergic Disorder Daughter    Esophageal cancer Neg Hx    Stomach cancer Neg Hx     SOCIAL HISTORY: Social History   Socioeconomic History   Marital status: Married    Spouse name: Jori Moll    Number of children: 3   Years of education: BA   Highest education level: Not on file  Occupational History   Occupation: Retired Programmer, multimedia: [place] resource strain: Not on file   Food insecurity    Worry: Not on file    Inability: Not on file   Transportation needs    Medical: Not on  file    Non-medical: Not on file  Tobacco Use   Smoking status: Former Smoker    Packs/day: 1.00    Years: 15.00    Pack years: 15.00    Types: Cigarettes    Quit date: 05/27/2005    Years since quitting: 14.3   Smokeless tobacco: Never Used  Substance and Sexual Activity   Alcohol use: No    Alcohol/week: 0.0 standard drinks   Drug use: No   Sexual activity: Yes    Partners: Male    Comment: 1st intercourse 73 yo-Fewer than 5 partners  Lifestyle   Physical activity    Days per week: Not on file    Minutes per session: Not on file   Stress: Not on file  Relationships   Social connections    Talks on phone: Not on file    Gets together: Not on file    Attends religious service: Not on file    Active member of club or organization: Not on file    Attends meetings of clubs or organizations: Not on file    Relationship status: Not on file   Intimate partner violence    Fear of current or ex partner: Not on file    Emotionally abused: Not on file    Physically abused: Not on file    Forced sexual activity: Not on file  Other Topics Concern   Not on file  Social History Narrative   Lives with husband.   Caffeine use: 1/2 cup per day   Exercise-- 2days a week [employer],  Water  aerobics, walking       Retired Marine scientist   No dietary restrictions, tries to maintain a heart healthy diet      PHYSICAL EXAM  Vitals:   09/25/19 0902  BP: 122/73  Pulse: 73  Temp: (!) 97.1 F (36.2 C)  Weight: 131 lb 6.4 oz (59.6 kg)   Body mass index is 21.87 kg/m.  Generalized: Well developed, in no acute distress  Cardiology: normal rate and rhythm, no murmur noted Neurological examination  Mentation: Alert oriented to time, place, history taking. Follows all commands speech and language fluent Cranial nerve II-XII: Pupils were equal round reactive to light. Extraocular movements were full, visual field were full on confrontational test. Facial sensation and strength were normal. Uvula tongue midline. Head turning and shoulder shrug  were normal and symmetric. Motor: The motor testing reveals 5 over 5 strength of all 4 extremities. Good symmetric motor tone is noted throughout.  Sensory: Sensory testing is intact to soft touch on all 4 extremities. No evidence of extinction is noted.  Coordination: Cerebellar testing reveals good finger-nose-finger and heel-to-shin bilaterally.  Gait and station: Gait is normal. Tandem gait is normal. Romberg is negative. No drift is seen.  Reflexes: Deep tendon reflexes are symmetric and normal bilaterally.   DIAGNOSTIC DATA (LABS, IMAGING, TESTING) - I reviewed patient records, labs, notes, testing and imaging myself where available.  MMSE - Mini Mental State Exam 07/26/2016  Orientation to time 4  Orientation to Place 4  Registration 3  Attention/ Calculation 5  Recall 2  Language- name 2 objects 2  Language- repeat 1  Language- follow 3 step command 2  Language- read & follow direction 1  Write a sentence 1  Copy design 0  Total score 25     Lab Results  Component Value Date   WBC 6.8 07/14/2019   HGB 12.5 07/14/2019   HCT 38.8 07/14/2019  MCV 93.7 07/14/2019   PLT 296.0 07/14/2019      Component Value Date/Time   NA  139 07/14/2019 0921   NA 139 09/03/2018 1114   NA 140 04/20/2015 1050   K 4.7 07/14/2019 0921   K 3.9 04/20/2015 1050   CL 103 07/14/2019 0921   CL 103 11/12/2012 1549   CO2 31 07/14/2019 0921   CO2 26 04/20/2015 1050   GLUCOSE 87 07/14/2019 0921   GLUCOSE 82 04/20/2015 1050   GLUCOSE 103 (H) 11/12/2012 1549   BUN 19 07/14/2019 0921   BUN 15 09/03/2018 1114   BUN 13.1 04/20/2015 1050   CREATININE 1.03 07/14/2019 0921   CREATININE 1.1 04/20/2015 1050   CALCIUM 8.9 07/14/2019 0921   CALCIUM 8.9 04/20/2015 1050   PROT 6.3 07/14/2019 0921   PROT 6.4 09/03/2018 1114   PROT 6.8 04/20/2015 1050   ALBUMIN 4.0 07/14/2019 0921   ALBUMIN 4.0 09/03/2018 1114   ALBUMIN 3.7 04/20/2015 1050   AST 15 07/14/2019 0921   AST 23 04/20/2015 1050   ALT 10 07/14/2019 0921   ALT 16 04/20/2015 1050   ALKPHOS 86 07/14/2019 0921   ALKPHOS 87 04/20/2015 1050   BILITOT 0.3 07/14/2019 0921   BILITOT 0.3 09/03/2018 1114   BILITOT 0.26 04/20/2015 1050   GFRNONAA 57 (L) 09/03/2018 1114   GFRNONAA 51 (L) 07/27/2014 1227   GFRAA 66 09/03/2018 1114   GFRAA 59 (L) 07/27/2014 1227   Lab Results  Component Value Date   CHOL 130 07/14/2019   HDL 46.80 07/14/2019   LDLCALC 54 07/14/2019   LDLDIRECT 117.3 04/08/2012   TRIG 145.0 07/14/2019   CHOLHDL 3 07/14/2019   Lab Results  Component Value Date   HGBA1C 5.8 03/01/2015   Lab Results  Component Value Date   VITAMINB12 429 09/20/2017   Lab Results  Component Value Date   TSH 2.37 07/14/2019       ASSESSMENT AND PLAN 73 y.o. year old female  has a past medical history of Allergy, Anemia, Anxiety, Arthritis, Asthma, Atrial flutter ([place]), CAD (coronary artery disease) (CARDIOLOGIST--  DR Angelena Form), Cancer ([place]), Cataract, Chronic constipation, Chronic kidney disease, COPD (chronic obstructive pulmonary disease) ([place]), Depression, Family history of malignant hyperthermia, Fibromyalgia, Frequency of urination, GERD (gastroesophageal reflux  disease), H/O hiatal hernia, History of basal cell carcinoma excision, History of breast cancer (ONCOLOGIST-- DR Jana Hakim---  NO RECURRANCE), History of chronic bronchitis, History of colonic polyps, History of tachycardia, Hyperlipidemia, Hypertension, Neuromuscular disorder ([place]), Osteoporosis (01/2019), Pelvic pain, Personal history of radiation therapy, S/P radiation therapy (11/12/12 - 12/05/12), Sepsis ([place]) (2014), Sinus headache, and Urgency of urination. here with     ICD-10-CM   1. Other polyneuropathy  G62.89   2. Other chronic pain  G89.29   3. RLS (restless legs syndrome)  G25.81     Mrs Weinreich continues to have multiple complaints and concerns.  She will continue close follow-up with primary care and pain management for concerns of pain, fibromyalgia and insomnia.  She will continue gabapentin for peripheral neuropathy.  We will try ropinirole 0.25 mg at night for concerns of restless leg.  She may follow-up with pain management or primary care for refills if this helps.  Educational materials on restless leg syndrome provided in AVS.  She was advised a healthy lifestyle with regular exercise and healthy diet.  She verbalizes understanding.  She will follow-up as needed.   No orders of the defined types were placed in this encounter.  Meds ordered this encounter  Medications   rOPINIRole (REQUIP) 0.25 MG tablet    Sig: Take 1 tablet (0.25 mg total) by mouth 3 (three) times daily.    Dispense:  30 tablet    Refill:  5    Order Specific Question:   Supervising Provider    Answer:   Melvenia Beam [id number], FNP-C 09/29/2019, 2:38 PM [facility] [address] [place], [place] [address] [phone]  Made any corrections needed, and agree with history, physical, neuro exam,assessment and plan as stated.     Sarina Ill, MD [facility]

## 2019-09-26 ENCOUNTER — Encounter: Payer: Self-pay | Admitting: Physical Therapy

## 2019-09-26 ENCOUNTER — Ambulatory Visit: Payer: Medicare Other | Admitting: Physical Therapy

## 2019-09-26 ENCOUNTER — Other Ambulatory Visit: Payer: Self-pay

## 2019-09-26 DIAGNOSIS — M6281 Muscle weakness (generalized): Secondary | ICD-10-CM

## 2019-09-26 DIAGNOSIS — M5442 Lumbago with sciatica, left side: Secondary | ICD-10-CM | POA: Diagnosis not present

## 2019-09-26 DIAGNOSIS — M25561 Pain in right knee: Secondary | ICD-10-CM

## 2019-09-26 DIAGNOSIS — G8929 Other chronic pain: Secondary | ICD-10-CM

## 2019-09-26 DIAGNOSIS — R2689 Other abnormalities of gait and mobility: Secondary | ICD-10-CM

## 2019-09-26 DIAGNOSIS — M5441 Lumbago with sciatica, right side: Secondary | ICD-10-CM

## 2019-09-26 DIAGNOSIS — M546 Pain in thoracic spine: Secondary | ICD-10-CM

## 2019-09-26 NOTE — Therapy (Signed)
[place], [place], [address] Phone: [phone]   Fax:  [phone]  Physical Therapy Treatment  Patient Details  Name: [name] MRN: [id number] Date of Birth: 23-Jun-1946 Referring Provider (PT): Levy Pupa, [place]   Encounter Date: 09/26/2019  PT End of Session - 09/26/19 1140    Visit Number  6    Number of Visits  12    Date for PT Re-Evaluation  10/09/19    PT Start Time  1058    PT Stop Time  1145    PT Time Calculation (min)  47 min    Activity Tolerance  Patient tolerated treatment well    Behavior During Therapy  [facility] for tasks assessed/performed       Past Medical History:  Diagnosis Date  . Allergy   . Anemia   . Anxiety    past hx   . Arthritis   . Asthma   . Atrial flutter ([place])    past history- not current  . CAD (coronary artery disease) CARDIOLOGIST--  DR Angelena Form   mild non-obstructive cad  . Cancer ([place])    right  . Cataract    bilaterally removed   . Chronic constipation   . Chronic kidney disease    interstitial cystitis  . COPD (chronic obstructive pulmonary disease) ([place])   . Depression    past hx   . Family history of malignant hyperthermia    father had this  . Fibromyalgia   . Frequency of urination   . GERD (gastroesophageal reflux disease)   . H/O hiatal hernia   . History of basal cell carcinoma excision    X2  . History of breast cancer ONCOLOGIST-- DR Jana Hakim---  NO RECURRANCE   DX 07/2012;  LOW GRADE DCIS  ER+PR+  ----  S/P RIGHT LUMPECTOMY WITH NEGATIVE MARGINS/   RADIATION ENDED 11/2012  . History of chronic bronchitis   . History of colonic polyps   . History of tachycardia    CONTROLLED  WITH ATENOLOL  . Hyperlipidemia   . Hypertension   . Neuromuscular disorder (HCC)    fibromyalgia  . Osteoporosis 01/2019   T score -2.2 stable/improved from prior study  . Pelvic pain   . Personal history of radiation therapy   . S/P radiation  therapy 11/12/12 - 12/05/12   right Breast  . Sepsis ([place]) 2014   from UTI   . Sinus headache   . Urgency of urination     Past Surgical History:  Procedure Laterality Date  . [place] STUDY N/A 04/03/2016   Procedure: [place] STUDY;  Surgeon: Manus Gunning, MD;  [facility]: [facility];  Service: Gastroenterology;  Laterality: N/A;  . BREAST LUMPECTOMY Right 10-11-2012   W/ SLN BX  . CARDIAC CATHETERIZATION  09-13-2007  DR Lia Foyer   WELL-PRESERVED LVF/  DIFFUSE SCATTERED CORONARY CALCIFACATION AND ATHEROSCLEROSIS WITHOUT OBSTRUCTION  . CARDIAC CATHETERIZATION  08-04-2010  DR Angelena Form   NON-OBSTRUCTIVE CAD/  pLAD 40%/  oLAD 30%/  mLAD 30%/  pRCA 30%/  EF 60%  . CARDIOVASCULAR STRESS TEST  06-18-2012  DR McALHANY   LOW RISK NUCLEAR STUDY/  SMALL FIXED AREA OF MODERATELY DECREASED UPTAKE IN ANTEROSEPTAL WALL WHICH MAY BE ARTIFACTUAL/  NO ISCHEMIA/  EF 68%  . COLONOSCOPY  09-29-2010  . CYSTOSCOPY    . CYSTOSCOPY WITH HYDRODISTENSION AND BIOPSY N/A 03/06/2014   Procedure: CYSTOSCOPY/HYDRODISTENSION/ INSTILATION OF MARCAINE AND PYRIDIUM;  Surgeon: Shane Crutch  Gaynelle Arabian, MD;  Location: [facility];  Service: Urology;  Laterality: N/A;  . ESOPHAGEAL MANOMETRY N/A 04/03/2016   Procedure: ESOPHAGEAL MANOMETRY (EM);  Surgeon: Manus Gunning, MD;  [facility];  Service: Gastroenterology;  Laterality: N/A;  . EXTRACORPOREAL SHOCK WAVE LITHOTRIPSY Left 02/06/2019   Procedure: EXTRACORPOREAL SHOCK WAVE LITHOTRIPSY (ESWL);  Surgeon: Kathie Rhodes, MD;  [facility];  Service: Urology;  Laterality: Left;  . NASAL SINUS SURGERY  1985  . ORIF RIGHT ANKLE  FX  2006  . POLYPECTOMY    . REMOVAL VOCAL CORD CYST  FEB 2014  . RIGHT BREAST BX  08-23-2012  . RIGHT HAND SURGERY  X3  LAST ONE 2009   INCLUDES  ORIF RIGHT 5TH FINGER AND REVISION TWICE  . SKIN CANCER EXCISION    . TONSILLECTOMY AND ADENOIDECTOMY  AGE 52  . TOTAL ABDOMINAL HYSTERECTOMY W/ BILATERAL  SALPINGOOPHORECTOMY  1982   W/  APPENDECTOMY  . TRANSTHORACIC ECHOCARDIOGRAM  06-24-2012   GRADE I DIASTOLIC DYSFUNCTION/  EF 55-60%/  MILD MR  . UPPER GASTROINTESTINAL ENDOSCOPY      There were no vitals filed for this visit.  Subjective Assessment - 09/26/19 1103    Subjective  saw neurologist yesterday, wants her to reconsider surgery - feels most of her symptoms are coming from her back.    Limitations  Walking    How long can you walk comfortably?  15 min    Patient Stated Goals  walk without knee or back hurting, get up off floor by myself    Currently in Pain?  Yes    Pain Score  3     Pain Location  Back    Pain Orientation  Mid;Lower    Pain Descriptors / Indicators  Aching;Dull    Pain Type  Chronic pain    Pain Onset  More than a month ago    Pain Frequency  Constant    Aggravating Factors   walking    Pain Relieving Factors  walking, movement    Pain Onset  More than a month ago                       [facility] Adult PT Treatment/Exercise - 09/26/19 1105      Exercises   Exercises  Lumbar      Lumbar Exercises: Stretches   Single Knee to Chest Stretch  Right;Left;5 reps;10 seconds    Double Knee to Chest Stretch  3 reps;20 seconds    ITB Stretch  Right;Left;3 reps;20 seconds    Other Lumbar Stretch Exercise  book openers 5x15 sec bil      Lumbar Exercises: Aerobic   Nustep  L5 x 8 min      Lumbar Exercises: Supine   Bridge  10 reps;5 seconds      Lumbar Exercises: Prone   Straight Leg Raise  10 reps;2 seconds    Straight Leg Raises Limitations  alternating      Lumbar Exercises: Quadruped   [place]  5 reps    Single Arm Raise  Right;Left;10 reps;2 seconds    Straight Leg Raise  5 reps;2 seconds    Other Quadruped Lumbar Exercises  childs pose 2 x 10 sec                  PT Long Term Goals - 09/18/19 1251      PT LONG TERM GOAL #1   Title  independent with HEP  Status  New    Target Date  10/09/19      PT LONG  TERM GOAL #2   Title  FOTO score improved to </= 53% limited for improved function    Status  New    Target Date  10/09/19      PT LONG TERM GOAL #3   Title  perform thoracolumbar flexion without increase in pain    Status  New    Target Date  10/09/19      PT LONG TERM GOAL #4   Title  improve 5x STS to </= 17 sec for improved functional strength    Status  New    Target Date  10/09/19      PT LONG TERM GOAL #5   Title  verbalize compliance with community fitness and plans to continue at d/c    Status  New    Target Date  10/09/19            Plan - 09/26/19 1141    Clinical Impression Statement  Pt tolerated session well today, and anticipates she will resume community fitness next week.  Overall progressing well towards goals.    Personal Factors and Comorbidities  Comorbidity 3+;Past/Current Experience;Time since onset of injury/illness/exacerbation    Comorbidities  osteoporosis, HTN, fibromyalgia, COPD, CAD, asthma    Examination-Activity Limitations  Sit;Stand;Locomotion Level    Examination-Participation Restrictions  Cleaning;Driving;Yard Work    Stability/Clinical Decision Making  Stable/Uncomplicated    Rehab Potential  Good    PT Frequency  2x / week    PT Duration  6 weeks    PT Treatment/Interventions  ADLs/Self Care Home Management;Cryotherapy;Electrical Stimulation;Moist Heat;Therapeutic exercise;Therapeutic activities;Functional mobility training;Stair training;Gait training;Neuromuscular re-education;Patient/family education;Manual techniques;Taping;Dry needling;Passive range of motion    PT Next Visit Plan  discuss if pt ready for d/c, continue as indicated    PT Home Exercise Plan  Access Code: [email or web address]    Consulted and Agree with Plan of Care  Patient       Patient will benefit from skilled therapeutic intervention in order to improve the following deficits and impairments:  Pain, Postural dysfunction, Decreased range of motion, Impaired flexibility,  Decreased mobility, Decreased strength  Visit Diagnosis: Chronic bilateral low back pain with bilateral sciatica  Chronic pain of right knee  Other abnormalities of gait and mobility  Muscle weakness (generalized)  Pain in thoracic spine     Problem List Patient Active Problem List   Diagnosis Date Noted  . Kidney stone 02/26/2019  . Recurrent sinusitis 02/25/2019  . Hx of colonic polyp 02/25/2019  . DDD (degenerative disc disease), cervical 09/17/2018  . Degenerative scoliosis 04/22/2018  . Primary insomnia 06/25/2017  . Chronic interstitial cystitis 02/01/2017  . Cough, persistent 01/09/2017  . Hand pain, right 07/20/2016  . Deviated septum 05/12/2016  . Nasal turbinate hypertrophy 05/12/2016  . Dysphagia   . Allergic rhinitis 01/25/2016  . Other and combined forms of senile cataract 07/15/2015  . Dyspnea   . Left hip pain 04/30/2015  . Sciatica 04/30/2015  . Knee pain, right 04/30/2015  . Bilateral carpal tunnel syndrome 03/04/2015  . Hyperlipidemia 03/01/2015  . Pulmonary nodules 02/12/2015  . External hemorrhoids 02/05/2015  . Chronic night sweats 01/08/2015  . Atypical chest pain 01/01/2015  . Renal insufficiency 07/16/2014  . Memory loss 05/22/2014  . Peripheral neuropathy 05/22/2014  . Headache 10/13/2013  . Pain, joint, multiple sites 08/18/2013  . ANA positive 07/29/2013  . Depression 02/05/2013  . Family history of malignant  hyperthermia 02/05/2013  . Malignant neoplasm of lower-outer quadrant of right breast of female, estrogen receptor positive ([place]) 01/03/2013  . Splenic lesion 09/02/2012  . Asthma, allergic 08/05/2012  . GERD (gastroesophageal reflux disease) 06/03/2012  . Edema 04/08/2012  . Stricture and stenosis of esophagus 10/19/2011  . Constipation - functional 07/21/2011  . Nonspecific (abnormal) findings on radiological and other examination of biliary tract 07/21/2011  . Osteoporosis 04/17/2011  . FIBROCYSTIC BREAST DISEASE, HX OF  10/18/2010  . Essential hypertension 08/25/2010  . CAD in native artery 08/25/2010  . TOBACCO ABUSE, HX OF 08/25/2010  . GENERALIZED ANXIETY DISORDER 08/03/2010  . FIBROMYALGIA 01/11/2010      Laureen Abrahams, PT, DPT 09/26/19 11:47 AM     [facility] [place], [place], [address] Phone: [phone]   Fax:  [phone]  Name: Saeeda Parfait MRN: [id number] Date of Birth: 1946-12-05

## 2019-09-29 ENCOUNTER — Encounter: Payer: Self-pay | Admitting: Family Medicine

## 2019-10-01 ENCOUNTER — Other Ambulatory Visit: Payer: Self-pay

## 2019-10-01 ENCOUNTER — Encounter: Payer: Self-pay | Admitting: Physical Therapy

## 2019-10-01 ENCOUNTER — Ambulatory Visit (INDEPENDENT_AMBULATORY_CARE_PROVIDER_SITE_OTHER): Payer: Medicare Other | Admitting: Physical Therapy

## 2019-10-01 DIAGNOSIS — G8929 Other chronic pain: Secondary | ICD-10-CM

## 2019-10-01 DIAGNOSIS — M25561 Pain in right knee: Secondary | ICD-10-CM

## 2019-10-01 DIAGNOSIS — M5441 Lumbago with sciatica, right side: Secondary | ICD-10-CM

## 2019-10-01 DIAGNOSIS — M5442 Lumbago with sciatica, left side: Secondary | ICD-10-CM

## 2019-10-01 DIAGNOSIS — R2689 Other abnormalities of gait and mobility: Secondary | ICD-10-CM

## 2019-10-01 DIAGNOSIS — M6281 Muscle weakness (generalized): Secondary | ICD-10-CM

## 2019-10-01 NOTE — Therapy (Signed)
[place], [place], [address] Phone: [phone]   Fax:  [phone]  Physical Therapy Treatment  Patient Details  Name: [name] MRN: [id number] Date of Birth: [birth year] Referring Provider (PT): Levy Pupa, [place]   Encounter Date: 10/01/2019  PT End of Session - 10/01/19 1257    Visit Number  7    Number of Visits  12    Date for PT Re-Evaluation  10/09/19    PT Start Time  1147    PT Stop Time  1230    PT Time Calculation (min)  43 min    Activity Tolerance  Patient tolerated treatment well    Behavior During Therapy  [facility] for tasks assessed/performed       Past Medical History:  Diagnosis Date  . Allergy   . Anemia   . Anxiety    past hx   . Arthritis   . Asthma   . Atrial flutter ([place])    past history- not current  . CAD (coronary artery disease) CARDIOLOGIST--  DR Angelena Form   mild non-obstructive cad  . Cancer ([place])    right  . Cataract    bilaterally removed   . Chronic constipation   . Chronic kidney disease    interstitial cystitis  . COPD (chronic obstructive pulmonary disease) ([place])   . Depression    past hx   . Family history of malignant hyperthermia    father had this  . Fibromyalgia   . Frequency of urination   . GERD (gastroesophageal reflux disease)   . H/O hiatal hernia   . History of basal cell carcinoma excision    X2  . History of breast cancer ONCOLOGIST-- DR Jana Hakim---  NO RECURRANCE   DX 07/2012;  LOW GRADE DCIS  ER+PR+  ----  S/P RIGHT LUMPECTOMY WITH NEGATIVE MARGINS/   RADIATION ENDED 11/2012  . History of chronic bronchitis   . History of colonic polyps   . History of tachycardia    CONTROLLED  WITH ATENOLOL  . Hyperlipidemia   . Hypertension   . Neuromuscular disorder (HCC)    fibromyalgia  . Osteoporosis 01/2019   T score -2.2 stable/improved from prior study  . Pelvic pain   . Personal history of radiation therapy   . S/P radiation  therapy 11/12/12 - 12/05/12   right Breast  . Sepsis ([place]) 2014   from UTI   . Sinus headache   . Urgency of urination     Past Surgical History:  Procedure Laterality Date  . [place] STUDY N/A 04/03/2016   Procedure: [place] STUDY;  Surgeon: Manus Gunning, MD;  [facility];  Service: Gastroenterology;  Laterality: N/A;  . BREAST LUMPECTOMY Right 10-11-2012   W/ SLN BX  . CARDIAC CATHETERIZATION  09-13-2007  DR Lia Foyer   WELL-PRESERVED LVF/  DIFFUSE SCATTERED CORONARY CALCIFACATION AND ATHEROSCLEROSIS WITHOUT OBSTRUCTION  . CARDIAC CATHETERIZATION  08-04-2010  DR Angelena Form   NON-OBSTRUCTIVE CAD/  pLAD 40%/  oLAD 30%/  mLAD 30%/  pRCA 30%/  EF 60%  . CARDIOVASCULAR STRESS TEST  06-18-2012  DR McALHANY   LOW RISK NUCLEAR STUDY/  SMALL FIXED AREA OF MODERATELY DECREASED UPTAKE IN ANTEROSEPTAL WALL WHICH MAY BE ARTIFACTUAL/  NO ISCHEMIA/  EF 68%  . COLONOSCOPY  09-29-2010  . CYSTOSCOPY    . CYSTOSCOPY WITH HYDRODISTENSION AND BIOPSY N/A 03/06/2014   Procedure: CYSTOSCOPY/HYDRODISTENSION/ INSTILATION OF MARCAINE AND PYRIDIUM;  Surgeon: Shane Crutch  Gaynelle Arabian, MD;  Location: [facility];  Service: Urology;  Laterality: N/A;  . ESOPHAGEAL MANOMETRY N/A 04/03/2016   Procedure: ESOPHAGEAL MANOMETRY (EM);  Surgeon: Manus Gunning, MD;  [facility];  Service: Gastroenterology;  Laterality: N/A;  . EXTRACORPOREAL SHOCK WAVE LITHOTRIPSY Left 02/06/2019   Procedure: EXTRACORPOREAL SHOCK WAVE LITHOTRIPSY (ESWL);  Surgeon: Kathie Rhodes, MD;  [facility];  Service: Urology;  Laterality: Left;  . NASAL SINUS SURGERY  1985  . ORIF RIGHT ANKLE  FX  2006  . POLYPECTOMY    . REMOVAL VOCAL CORD CYST  FEB 2014  . RIGHT BREAST BX  08-23-2012  . RIGHT HAND SURGERY  X3  LAST ONE 2009   INCLUDES  ORIF RIGHT 5TH FINGER AND REVISION TWICE  . SKIN CANCER EXCISION    . TONSILLECTOMY AND ADENOIDECTOMY  AGE 73  . TOTAL ABDOMINAL HYSTERECTOMY W/ BILATERAL  SALPINGOOPHORECTOMY  1982   W/  APPENDECTOMY  . TRANSTHORACIC ECHOCARDIOGRAM  06-24-2012   GRADE I DIASTOLIC DYSFUNCTION/  EF 55-60%/  MILD MR  . UPPER GASTROINTESTINAL ENDOSCOPY      There were no vitals filed for this visit.  Subjective Assessment - 10/01/19 1152    Subjective  went to [employer] yesterday, ready to go back to the gym.  overall doing much better and went shopping yesterday walking a lot.    Limitations  Walking    How long can you walk comfortably?  15 min    Patient Stated Goals  walk without knee or back hurting, get up off floor by myself    Currently in Pain?  No/denies    Pain Onset  More than a month ago    Pain Onset  More than a month ago         [facility] PT Assessment - 10/01/19 1221      Observation/Other Assessments   Focus on Therapeutic Outcomes (FOTO)   65 (35% limited)                   [facility] Adult PT Treatment/Exercise - 10/01/19 1155      Lumbar Exercises: Stretches   Single Knee to Chest Stretch  Right;Left;5 reps;10 seconds    Double Knee to Chest Stretch  3 reps;20 seconds    ITB Stretch  Right;Left;3 reps;20 seconds    Other Lumbar Stretch Exercise  book openers 5x15 sec bil      Lumbar Exercises: Aerobic   Nustep  L5 x 8 min      Lumbar Exercises: Standing   Other Standing Lumbar Exercises  plank at counter 5x20 sec      Lumbar Exercises: Seated   Sit to Stand  10 reps    Sit to Stand Limitations  with 5#; chest press at end range      Lumbar Exercises: Supine   Bridge  10 reps;5 seconds                  PT Long Term Goals - 10/01/19 1257      PT LONG TERM GOAL #1   Title  independent with HEP    Status  Achieved      PT LONG TERM GOAL #2   Title  FOTO score improved to </= 53% limited for improved function    Status  Achieved      PT LONG TERM GOAL #3   Title  perform thoracolumbar flexion without increase in pain    Status  On-going  Target Date  10/09/19      PT LONG TERM GOAL #4    Title  improve 5x STS to </= 17 sec for improved functional strength    Status  On-going    Target Date  10/09/19      PT LONG TERM GOAL #5   Title  verbalize compliance with community fitness and plans to continue at d/c    Status  Achieved            Plan - 10/01/19 1257    Clinical Impression Statement  Pt has met 3/5 LTGs and anticipate she will meet remaining goals at next visit.  Anticipate d/c next visit.  Progressing well with PT.    Personal Factors and Comorbidities  Comorbidity 3+;Past/Current Experience;Time since onset of injury/illness/exacerbation    Comorbidities  osteoporosis, HTN, fibromyalgia, COPD, CAD, asthma    Examination-Activity Limitations  Sit;Stand;Locomotion Level    Examination-Participation Restrictions  Cleaning;Driving;Yard Work    Stability/Clinical Decision Making  Stable/Uncomplicated    Rehab Potential  Good    PT Frequency  2x / week    PT Duration  6 weeks    PT Treatment/Interventions  ADLs/Self Care Home Management;Cryotherapy;Electrical Stimulation;Moist Heat;Therapeutic exercise;Therapeutic activities;Functional mobility training;Stair training;Gait training;Neuromuscular re-education;Patient/family education;Manual techniques;Taping;Dry needling;Passive range of motion    PT Next Visit Plan  check remaining goals, d/c PT    PT Home Exercise Plan  Access Code: [email or web address]    Consulted and Agree with Plan of Care  Patient       Patient will benefit from skilled therapeutic intervention in order to improve the following deficits and impairments:  Pain, Postural dysfunction, Decreased range of motion, Impaired flexibility, Decreased mobility, Decreased strength  Visit Diagnosis: Chronic bilateral low back pain with bilateral sciatica  Chronic pain of right knee  Other abnormalities of gait and mobility  Muscle weakness (generalized)     Problem List Patient Active Problem List   Diagnosis Date Noted  . Kidney stone 02/26/2019  .  Recurrent sinusitis 02/25/2019  . Hx of colonic polyp 02/25/2019  . DDD (degenerative disc disease), cervical 09/17/2018  . Degenerative scoliosis 04/22/2018  . Primary insomnia 06/25/2017  . Chronic interstitial cystitis 02/01/2017  . Cough, persistent 01/09/2017  . Hand pain, right 07/20/2016  . Deviated septum 05/12/2016  . Nasal turbinate hypertrophy 05/12/2016  . Dysphagia   . Allergic rhinitis 01/25/2016  . Other and combined forms of senile cataract 07/15/2015  . Dyspnea   . Left hip pain 04/30/2015  . Sciatica 04/30/2015  . Knee pain, right 04/30/2015  . Bilateral carpal tunnel syndrome 03/04/2015  . Hyperlipidemia 03/01/2015  . Pulmonary nodules 02/12/2015  . External hemorrhoids 02/05/2015  . Chronic night sweats 01/08/2015  . Atypical chest pain 01/01/2015  . Renal insufficiency 07/16/2014  . Memory loss 05/22/2014  . Peripheral neuropathy 05/22/2014  . Headache 10/13/2013  . Pain, joint, multiple sites 08/18/2013  . ANA positive 07/29/2013  . Depression 02/05/2013  . Family history of malignant hyperthermia 02/05/2013  . Malignant neoplasm of lower-outer quadrant of right breast of female, estrogen receptor positive ([place]) 01/03/2013  . Splenic lesion 09/02/2012  . Asthma, allergic 08/05/2012  . GERD (gastroesophageal reflux disease) 06/03/2012  . Edema 04/08/2012  . Stricture and stenosis of esophagus 10/19/2011  . Constipation - functional 07/21/2011  . Nonspecific (abnormal) findings on radiological and other examination of biliary tract 07/21/2011  . Osteoporosis 04/17/2011  . FIBROCYSTIC BREAST DISEASE, HX OF 10/18/2010  . Essential hypertension 08/25/2010  . CAD  in native artery 08/25/2010  . TOBACCO ABUSE, HX OF 08/25/2010  . GENERALIZED ANXIETY DISORDER 08/03/2010  . FIBROMYALGIA 01/11/2010      Laureen Abrahams, PT, DPT 10/01/19 12:59 PM    [facility] [place], [place], [address] Phone: [phone]   Fax:  [phone]  Name: Kayelyn Lemon MRN: [id number] Date of Birth: 02/25/1946

## 2019-10-02 ENCOUNTER — Encounter: Payer: Self-pay | Admitting: Gynecology

## 2019-10-03 ENCOUNTER — Encounter: Payer: Self-pay | Admitting: Physical Therapy

## 2019-10-03 ENCOUNTER — Ambulatory Visit (INDEPENDENT_AMBULATORY_CARE_PROVIDER_SITE_OTHER): Payer: Medicare Other | Admitting: Physical Therapy

## 2019-10-03 ENCOUNTER — Other Ambulatory Visit: Payer: Self-pay

## 2019-10-03 DIAGNOSIS — M5442 Lumbago with sciatica, left side: Secondary | ICD-10-CM

## 2019-10-03 DIAGNOSIS — M5441 Lumbago with sciatica, right side: Secondary | ICD-10-CM

## 2019-10-03 DIAGNOSIS — M25561 Pain in right knee: Secondary | ICD-10-CM

## 2019-10-03 DIAGNOSIS — R2689 Other abnormalities of gait and mobility: Secondary | ICD-10-CM

## 2019-10-03 DIAGNOSIS — G8929 Other chronic pain: Secondary | ICD-10-CM

## 2019-10-03 DIAGNOSIS — M6281 Muscle weakness (generalized): Secondary | ICD-10-CM

## 2019-10-03 NOTE — Therapy (Signed)
[place], [place], [address] Phone: [phone]   Fax:  [phone]  Physical Therapy Treatment/Discharge Summary  Patient Details  Name: [name] MRN: [id number] Date of Birth: October 29, 1946 Referring Provider (PT): Levy Pupa, [place]   Encounter Date: 10/03/2019  PT End of Session - 10/03/19 1148    Visit Number  8    Number of Visits  12    Date for PT Re-Evaluation  10/09/19    PT Start Time  1100    PT Stop Time  1143    PT Time Calculation (min)  43 min    Activity Tolerance  Patient tolerated treatment well    Behavior During Therapy  [facility] for tasks assessed/performed       Past Medical History:  Diagnosis Date  . Allergy   . Anemia   . Anxiety    past hx   . Arthritis   . Asthma   . Atrial flutter ([place])    past history- not current  . CAD (coronary artery disease) CARDIOLOGIST--  DR Angelena Form   mild non-obstructive cad  . Cancer ([place])    right  . Cataract    bilaterally removed   . Chronic constipation   . Chronic kidney disease    interstitial cystitis  . COPD (chronic obstructive pulmonary disease) ([place])   . Depression    past hx   . Family history of malignant hyperthermia    father had this  . Fibromyalgia   . Frequency of urination   . GERD (gastroesophageal reflux disease)   . H/O hiatal hernia   . History of basal cell carcinoma excision    X2  . History of breast cancer ONCOLOGIST-- DR Jana Hakim---  NO RECURRANCE   DX 07/2012;  LOW GRADE DCIS  ER+PR+  ----  S/P RIGHT LUMPECTOMY WITH NEGATIVE MARGINS/   RADIATION ENDED 11/2012  . History of chronic bronchitis   . History of colonic polyps   . History of tachycardia    CONTROLLED  WITH ATENOLOL  . Hyperlipidemia   . Hypertension   . Neuromuscular disorder (HCC)    fibromyalgia  . Osteoporosis 01/2019   T score -2.2 stable/improved from prior study  . Pelvic pain   . Personal history of radiation therapy   .  S/P radiation therapy 11/12/12 - 12/05/12   right Breast  . Sepsis ([place]) 2014   from UTI   . Sinus headache   . Urgency of urination     Past Surgical History:  Procedure Laterality Date  . [place] STUDY N/A 04/03/2016   Procedure: [place] STUDY;  Surgeon: Manus Gunning, MD;  Location: [facility];  Service: Gastroenterology;  Laterality: N/A;  . BREAST LUMPECTOMY Right 10-11-2012   W/ SLN BX  . CARDIAC CATHETERIZATION  09-13-2007  DR Lia Foyer   WELL-PRESERVED LVF/  DIFFUSE SCATTERED CORONARY CALCIFACATION AND ATHEROSCLEROSIS WITHOUT OBSTRUCTION  . CARDIAC CATHETERIZATION  08-04-2010  DR Angelena Form   NON-OBSTRUCTIVE CAD/  pLAD 40%/  oLAD 30%/  mLAD 30%/  pRCA 30%/  EF 60%  . CARDIOVASCULAR STRESS TEST  06-18-2012  DR McALHANY   LOW RISK NUCLEAR STUDY/  SMALL FIXED AREA OF MODERATELY DECREASED UPTAKE IN ANTEROSEPTAL WALL WHICH MAY BE ARTIFACTUAL/  NO ISCHEMIA/  EF 68%  . COLONOSCOPY  09-29-2010  . CYSTOSCOPY    . CYSTOSCOPY WITH HYDRODISTENSION AND BIOPSY N/A 03/06/2014   Procedure: CYSTOSCOPY/HYDRODISTENSION/ INSTILATION OF MARCAINE AND PYRIDIUM;  Surgeon: Pierre Bali  Joya San, MD;  [facility];  Service: Urology;  Laterality: N/A;  . ESOPHAGEAL MANOMETRY N/A 04/03/2016   Procedure: ESOPHAGEAL MANOMETRY (EM);  Surgeon: Manus Gunning, MD;  Location: [facility];  Service: Gastroenterology;  Laterality: N/A;  . EXTRACORPOREAL SHOCK WAVE LITHOTRIPSY Left 02/06/2019   Procedure: EXTRACORPOREAL SHOCK WAVE LITHOTRIPSY (ESWL);  Surgeon: Kathie Rhodes, MD;  Location: [facility];  Service: Urology;  Laterality: Left;  . NASAL SINUS SURGERY  1985  . ORIF RIGHT ANKLE  FX  2006  . POLYPECTOMY    . REMOVAL VOCAL CORD CYST  FEB 2014  . RIGHT BREAST BX  08-23-2012  . RIGHT HAND SURGERY  X3  LAST ONE 2009   INCLUDES  ORIF RIGHT 5TH FINGER AND REVISION TWICE  . SKIN CANCER EXCISION    . TONSILLECTOMY AND ADENOIDECTOMY  AGE 21  . TOTAL ABDOMINAL HYSTERECTOMY  W/ BILATERAL SALPINGOOPHORECTOMY  1982   W/  APPENDECTOMY  . TRANSTHORACIC ECHOCARDIOGRAM  06-24-2012   GRADE I DIASTOLIC DYSFUNCTION/  EF 55-60%/  MILD MR  . UPPER GASTROINTESTINAL ENDOSCOPY      There were no vitals filed for this visit.  Subjective Assessment - 10/03/19 1106    Subjective  doing well; ready for d/c today    Limitations  Walking    How long can you walk comfortably?  15 min    Patient Stated Goals  walk without knee or back hurting, get up off floor by myself    Currently in Pain?  No/denies    Pain Onset  More than a month ago    Pain Onset  More than a month ago         [facility] PT Assessment - 10/03/19 1111      AROM   Overall AROM Comments  thoracolumbar ROM all performed without pain      Transfers   Five time sit to stand comments   13.79 sec                   [facility] Adult PT Treatment/Exercise - 10/03/19 1111      Lumbar Exercises: Stretches   Double Knee to Chest Stretch  3 reps;20 seconds    ITB Stretch  Right;Left;3 reps;20 seconds    Other Lumbar Stretch Exercise  cross body UE stretch with trunk rotation x 15 sec bil      Lumbar Exercises: Aerobic   Nustep  L5 x 8 min      Lumbar Exercises: Standing   Other Standing Lumbar Exercises  plank at counter 5x20 sec      Lumbar Exercises: Supine   Clam  10 reps    Clam Limitations  single limb; green theraband    Bridge  10 reps;5 seconds                  PT Long Term Goals - 10/03/19 1148      PT LONG TERM GOAL #1   Title  independent with HEP    Status  Achieved      PT LONG TERM GOAL #2   Title  FOTO score improved to </= 53% limited for improved function    Status  Achieved      PT LONG TERM GOAL #3   Title  perform thoracolumbar flexion without increase in pain    Status  Achieved      PT LONG TERM GOAL #4   Title  improve 5x STS to </= 17 sec for  improved functional strength    Status  Achieved      PT LONG TERM GOAL #5   Title  verbalize compliance  with community fitness and plans to continue at d/c    Status  Achieved            Plan - 10/03/19 1148    Clinical Impression Statement  Pt has met all goals and is ready for d/c from PT today.  Discussed continued community fitness and pt in compliance.    Personal Factors and Comorbidities  Comorbidity 3+;Past/Current Experience;Time since onset of injury/illness/exacerbation    Comorbidities  osteoporosis, HTN, fibromyalgia, COPD, CAD, asthma    Examination-Activity Limitations  Sit;Stand;Locomotion Level    Examination-Participation Restrictions  Cleaning;Driving;Yard Work    Stability/Clinical Decision Making  Stable/Uncomplicated    Rehab Potential  Good    PT Frequency  2x / week    PT Duration  6 weeks    PT Treatment/Interventions  ADLs/Self Care Home Management;Cryotherapy;Electrical Stimulation;Moist Heat;Therapeutic exercise;Therapeutic activities;Functional mobility training;Stair training;Gait training;Neuromuscular re-education;Patient/family education;Manual techniques;Taping;Dry needling;Passive range of motion    PT Next Visit Plan  check remaining goals, d/c PT    PT Home Exercise Plan  Access Code: [email or web address]    Consulted and Agree with Plan of Care  Patient       Patient will benefit from skilled therapeutic intervention in order to improve the following deficits and impairments:  Pain, Postural dysfunction, Decreased range of motion, Impaired flexibility, Decreased mobility, Decreased strength  Visit Diagnosis: Chronic bilateral low back pain with bilateral sciatica  Chronic pain of right knee  Other abnormalities of gait and mobility  Muscle weakness (generalized)     Problem List Patient Active Problem List   Diagnosis Date Noted  . Kidney stone 02/26/2019  . Recurrent sinusitis 02/25/2019  . Hx of colonic polyp 02/25/2019  . DDD (degenerative disc disease), cervical 09/17/2018  . Degenerative scoliosis 04/22/2018  . Primary insomnia 06/25/2017   . Chronic interstitial cystitis 02/01/2017  . Cough, persistent 01/09/2017  . Hand pain, right 07/20/2016  . Deviated septum 05/12/2016  . Nasal turbinate hypertrophy 05/12/2016  . Dysphagia   . Allergic rhinitis 01/25/2016  . Other and combined forms of senile cataract 07/15/2015  . Dyspnea   . Left hip pain 04/30/2015  . Sciatica 04/30/2015  . Knee pain, right 04/30/2015  . Bilateral carpal tunnel syndrome 03/04/2015  . Hyperlipidemia 03/01/2015  . Pulmonary nodules 02/12/2015  . External hemorrhoids 02/05/2015  . Chronic night sweats 01/08/2015  . Atypical chest pain 01/01/2015  . Renal insufficiency 07/16/2014  . Memory loss 05/22/2014  . Peripheral neuropathy 05/22/2014  . Headache 10/13/2013  . Pain, joint, multiple sites 08/18/2013  . ANA positive 07/29/2013  . Depression 02/05/2013  . Family history of malignant hyperthermia 02/05/2013  . Malignant neoplasm of lower-outer quadrant of right breast of female, estrogen receptor positive ([place]) 01/03/2013  . Splenic lesion 09/02/2012  . Asthma, allergic 08/05/2012  . GERD (gastroesophageal reflux disease) 06/03/2012  . Edema 04/08/2012  . Stricture and stenosis of esophagus 10/19/2011  . Constipation - functional 07/21/2011  . Nonspecific (abnormal) findings on radiological and other examination of biliary tract 07/21/2011  . Osteoporosis 04/17/2011  . FIBROCYSTIC BREAST DISEASE, HX OF 10/18/2010  . Essential hypertension 08/25/2010  . CAD in native artery 08/25/2010  . TOBACCO ABUSE, HX OF 08/25/2010  . GENERALIZED ANXIETY DISORDER 08/03/2010  . FIBROMYALGIA 01/11/2010      Laureen Abrahams, PT, DPT 10/03/19 11:50 AM  [place], [place], [address] Phone: [phone]   Fax:  [phone]  Name: [name] MRN: [id number] Date of Birth: [birth year]     PHYSICAL THERAPY DISCHARGE SUMMARY  Visits from Start of Care:  8  Current functional level related to goals / functional outcomes: See above   Remaining deficits: See above   Education / Equipment: HEP  Plan: Patient agrees to discharge.  Patient goals were met. Patient is being discharged due to meeting the stated rehab goals.  ?????    Laureen Abrahams, PT, DPT 10/03/19 11:51 AM  [facility] [place], [place] [address]  [phone] (office) [phone] (fax)

## 2019-10-10 ENCOUNTER — Telehealth: Payer: Self-pay | Admitting: *Deleted

## 2019-10-10 NOTE — Telephone Encounter (Signed)
Prolia insurance verification has been sent awaiting Summary of benefits  

## 2019-10-21 DIAGNOSIS — I8311 Varicose veins of right lower extremity with inflammation: Secondary | ICD-10-CM | POA: Diagnosis not present

## 2019-10-21 DIAGNOSIS — D485 Neoplasm of uncertain behavior of skin: Secondary | ICD-10-CM | POA: Diagnosis not present

## 2019-10-21 DIAGNOSIS — I8312 Varicose veins of left lower extremity with inflammation: Secondary | ICD-10-CM | POA: Diagnosis not present

## 2019-10-21 DIAGNOSIS — D225 Melanocytic nevi of trunk: Secondary | ICD-10-CM | POA: Diagnosis not present

## 2019-11-06 ENCOUNTER — Other Ambulatory Visit: Payer: Self-pay

## 2019-11-07 ENCOUNTER — Ambulatory Visit (INDEPENDENT_AMBULATORY_CARE_PROVIDER_SITE_OTHER): Payer: Medicare Other | Admitting: Gynecology

## 2019-11-07 ENCOUNTER — Encounter: Payer: Self-pay | Admitting: Family Medicine

## 2019-11-07 ENCOUNTER — Other Ambulatory Visit: Payer: Self-pay

## 2019-11-07 ENCOUNTER — Encounter: Payer: Self-pay | Admitting: Gynecology

## 2019-11-07 ENCOUNTER — Ambulatory Visit (INDEPENDENT_AMBULATORY_CARE_PROVIDER_SITE_OTHER): Payer: Medicare Other | Admitting: Family Medicine

## 2019-11-07 VITALS — BP 122/76 | Ht 64.5 in | Wt 138.0 lb

## 2019-11-07 DIAGNOSIS — E785 Hyperlipidemia, unspecified: Secondary | ICD-10-CM

## 2019-11-07 DIAGNOSIS — J329 Chronic sinusitis, unspecified: Secondary | ICD-10-CM | POA: Diagnosis not present

## 2019-11-07 DIAGNOSIS — Z01419 Encounter for gynecological examination (general) (routine) without abnormal findings: Secondary | ICD-10-CM | POA: Diagnosis not present

## 2019-11-07 DIAGNOSIS — N289 Disorder of kidney and ureter, unspecified: Secondary | ICD-10-CM

## 2019-11-07 DIAGNOSIS — N952 Postmenopausal atrophic vaginitis: Secondary | ICD-10-CM

## 2019-11-07 DIAGNOSIS — Z9289 Personal history of other medical treatment: Secondary | ICD-10-CM | POA: Diagnosis not present

## 2019-11-07 DIAGNOSIS — I10 Essential (primary) hypertension: Secondary | ICD-10-CM

## 2019-11-07 DIAGNOSIS — G2581 Restless legs syndrome: Secondary | ICD-10-CM | POA: Diagnosis not present

## 2019-11-07 DIAGNOSIS — M79601 Pain in right arm: Secondary | ICD-10-CM | POA: Diagnosis not present

## 2019-11-07 DIAGNOSIS — Z853 Personal history of malignant neoplasm of breast: Secondary | ICD-10-CM

## 2019-11-07 DIAGNOSIS — M810 Age-related osteoporosis without current pathological fracture: Secondary | ICD-10-CM

## 2019-11-07 MED ORDER — PRAMIPEXOLE DIHYDROCHLORIDE 0.125 MG PO TABS: 0.1250 mg | ORAL_TABLET | Freq: Three times a day (TID) | ORAL | 1 refills | Status: DC

## 2019-11-07 MED ORDER — DOXYCYCLINE HYCLATE 100 MG PO TABS: 100.0000 mg | ORAL_TABLET | Freq: Two times a day (BID) | ORAL | 0 refills | Status: DC

## 2019-11-07 MED ORDER — ESTRADIOL 10 MCG VA TABS: 1.0000 | ORAL_TABLET | VAGINAL | 4 refills | Status: DC

## 2019-11-07 NOTE — Progress Notes (Signed)
    [name] [birth year] [id number]        73 y.o.  G3P3 for breast and pelvic exam.  Several issues noted below.  Past medical history,surgical history, problem list, medications, allergies, family history and social history were all reviewed and documented as reviewed in the EPIC chart.  ROS:  Performed with pertinent positives and negatives included in the history, assessment and plan.   Additional significant findings : None   Exam: [name] assistant Vitals:   11/07/19 1052  BP: 122/76  Weight: 138 lb (62.6 kg)  Height: 5' 4.5" (1.638 m)   Body mass index is 23.32 kg/m.  General appearance:  Normal affect, orientation and appearance. Skin: Grossly normal HEENT: Without gross lesions.  No cervical or supraclavicular adenopathy. Thyroid normal.  Lungs:  Clear without wheezing, rales or rhonchi Cardiac: RR, without RMG Abdominal:  Soft, nontender, without masses, guarding, rebound, organomegaly or hernia Breasts:  Examined lying and sitting without masses, retractions, discharge or axillary adenopathy.  Well-healed right lumpectomy scar Pelvic:  Ext, BUS, Vagina: With atrophic changes  Adnexa: Without masses or tenderness    Anus and perineum: Normal   Rectovaginal: Normal sphincter tone without palpated masses or tenderness.    Assessment/Plan:  73 y.o. G3P3 female for breast and pelvic exam.  Status post TAH/BSO in the past.  1. Postmenopausal/atrophic genital changes.  Continues with Vagifem 10 mcg twice weekly.  Doing well with this and wants to continue.  History of breast cancer.  We have discussed the risks of absorption and increasing recurrence risk of breast cancer.  She understands there are no guarantees as far as safety.  The patient is comfortable continuing she notes it has decreased the episodes of vaginal staining thought to be due to rectovaginal fistula.  Refill x1 year provided. 2. Suspected rectovaginal fistula.  Has intermittent staining vaginally.   Notes it is less since Vagifem started.  Also notes when she has regular bowel movements she does well but notes staining whenever she is constipated and strains.  She is undergoing evaluation to include sigmoidoscopy and dye studies and we are unable to demonstrate a fistula.  At this point she is comfortable monitoring. 3. History of right breast cancer.  Exam NED.  Mammography 12/2018.  Continue with annual mammography when due. 4. History of VAIN.  Had used 5-FU in the past following her hysterectomy.  Also history of positive HPV in the past.  Subsequent Pap smears have all been negative to include negative HPV.  Pap smears 2012, 2013, 2014, 2018 and 2019 all negative.  No Pap smear done today.  We will plan follow-up Pap smear at 3-year interval. 5. Osteoporosis.  On Prolia.  DEXA 2020 T score -2.2 stable/improved from prior DEXA.  We will continue on Prolia and plan repeat DEXA in 2 years. 6. Colonoscopy 2019.  Repeat at their recommended interval. 7. Health maintenance.  No routine lab work done as patient does this elsewhere.  Follow-up 1 year, sooner as needed.   Anastasio Auerbach MD, 11:31 AM 11/07/2019

## 2019-11-07 NOTE — Patient Instructions (Signed)
Continue on Prolia every 6 months when due.  Follow-up in 1 year for annual exam

## 2019-11-09 DIAGNOSIS — G2581 Restless legs syndrome: Secondary | ICD-10-CM

## 2019-11-09 HISTORY — DX: Restless legs syndrome: G25.81

## 2019-11-09 NOTE — Assessment & Plan Note (Signed)
Monitor vitals weekly and report concerns, no changes to meds. Encouraged heart healthy diet such as the DASH diet and exercise as tolerated.

## 2019-11-09 NOTE — Assessment & Plan Note (Signed)
Hydrate and monitor 

## 2019-11-09 NOTE — Assessment & Plan Note (Signed)
Encouraged moist heat and gentle stretching as tolerated. May try NSAIDs and prescription meds as directed and report if symptoms worsen or seek immediate care. Has been following with [facility] but does note a new raised lesion on her distal arm. She will come in this week for evaluation.

## 2019-11-09 NOTE — Assessment & Plan Note (Signed)
Encouraged heart healthy diet, increase exercise, avoid trans fats, consider a krill oil cap daily 

## 2019-11-09 NOTE — Assessment & Plan Note (Signed)
She was started on Requip by another provider and while it helps her discomfort some it keeps her awake. Will try switching to Mirapex.

## 2019-11-09 NOTE — Progress Notes (Signed)
Virtual Visit via Video Note  I connected with [name] on 11/13/20at  8:20 AM EST by a video enabled telemedicine application and verified that I am speaking with the correct person using two identifiers.  Location: Patient: home Provider: home   I discussed the limitations of evaluation and management by telemedicine and the availability of in person appointments. The patient expressed understanding and agreed to proceed. [name], CMA was able to get the patient set up on a video visit.    Subjective:    Patient ID: [name], female    DOB: Aug 11, 1946, 73 y.o.   MRN: [id number]  No chief complaint on file.   HPI Patient is in today for follow up on chronic medical concerns including Hypertension, hyperlipidemia, right arm pain and more. No recent febrile illness but is dealing with right sided facial/sinus pain, congestion, fatigue. She also continues to struggle with right arm, especially elbow pain. She also has a firm lesion on lower arm which is growing. She also notes a mass over her left shoulder. She is maintaining good social isolation. Denies CP/palp/SOB/HA/fevers/GI or GU c/o. Taking meds as prescribed Past Medical History:  Diagnosis Date  . Allergy   . Anemia   . Anxiety    past hx   . Arthritis   . Asthma   . Atrial flutter ([place])    past history- not current  . CAD (coronary artery disease) CARDIOLOGIST--  DR Angelena Form   mild non-obstructive cad  . Cancer ([place])    right  . Cataract    bilaterally removed   . Chronic constipation   . Chronic kidney disease    interstitial cystitis  . COPD (chronic obstructive pulmonary disease) ([place])   . Depression    past hx   . Family history of malignant hyperthermia    father had this  . Fibromyalgia   . Frequency of urination   . GERD (gastroesophageal reflux disease)   . H/O hiatal hernia   . History of basal cell carcinoma excision    X2  . History of breast cancer ONCOLOGIST-- DR Jana Hakim---  NO  RECURRANCE   DX 07/2012;  LOW GRADE DCIS  ER+PR+  ----  S/P RIGHT LUMPECTOMY WITH NEGATIVE MARGINS/   RADIATION ENDED 11/2012  . History of chronic bronchitis   . History of colonic polyps   . History of tachycardia    CONTROLLED  WITH ATENOLOL  . Hyperlipidemia   . Hypertension   . Neuromuscular disorder ([facility])    fibromyalgia  . Osteoporosis 01/2019   T score -2.2 stable/improved from prior study  . Pelvic pain   . Personal history of radiation therapy   . S/P radiation therapy 11/12/12 - 12/05/12   right Breast  . Sepsis ([place]) 2014   from UTI   . Sinus headache   . Urgency of urination     Past Surgical History:  Procedure Laterality Date  . [place] STUDY N/A 04/03/2016   Procedure: [place] STUDY;  Surgeon: Manus Gunning, MD;  Location: [facility];  Service: Gastroenterology;  Laterality: N/A;  . BREAST LUMPECTOMY Right 10-11-2012   W/ SLN BX  . CARDIAC CATHETERIZATION  09-13-2007  DR Lia Foyer   WELL-PRESERVED LVF/  DIFFUSE SCATTERED CORONARY CALCIFACATION AND ATHEROSCLEROSIS WITHOUT OBSTRUCTION  . CARDIAC CATHETERIZATION  08-04-2010  DR Angelena Form   NON-OBSTRUCTIVE CAD/  pLAD 40%/  oLAD 30%/  mLAD 30%/  pRCA 30%/  EF 60%  . CARDIOVASCULAR STRESS TEST  06-18-2012  DR Angelena Form   LOW RISK NUCLEAR STUDY/  SMALL FIXED AREA OF MODERATELY DECREASED UPTAKE IN ANTEROSEPTAL WALL WHICH MAY BE ARTIFACTUAL/  NO ISCHEMIA/  EF 68%  . COLONOSCOPY  09-29-2010  . CYSTOSCOPY    . CYSTOSCOPY WITH HYDRODISTENSION AND BIOPSY N/A 03/06/2014   Procedure: CYSTOSCOPY/HYDRODISTENSION/ INSTILATION OF MARCAINE AND PYRIDIUM;  Surgeon: Ailene Rud, MD;  Location: [facility];  Service: Urology;  Laterality: N/A;  . ESOPHAGEAL MANOMETRY N/A 04/03/2016   Procedure: ESOPHAGEAL MANOMETRY (EM);  Surgeon: Manus Gunning, MD;  Location: [facility];  Service: Gastroenterology;  Laterality: N/A;  . EXTRACORPOREAL SHOCK WAVE LITHOTRIPSY Left 02/06/2019   Procedure:  EXTRACORPOREAL SHOCK WAVE LITHOTRIPSY (ESWL);  Surgeon: Kathie Rhodes, MD;  Location: [facility];  Service: Urology;  Laterality: Left;  . NASAL SINUS SURGERY  1985  . ORIF RIGHT ANKLE  FX  2006  . POLYPECTOMY    . REMOVAL VOCAL CORD CYST  FEB 2014  . RIGHT BREAST BX  08-23-2012  . RIGHT HAND SURGERY  X3  LAST ONE 2009   INCLUDES  ORIF RIGHT 5TH FINGER AND REVISION TWICE  . SKIN CANCER EXCISION    . TONSILLECTOMY AND ADENOIDECTOMY  AGE 72  . TOTAL ABDOMINAL HYSTERECTOMY W/ BILATERAL SALPINGOOPHORECTOMY  1982   W/  APPENDECTOMY  . TRANSTHORACIC ECHOCARDIOGRAM  06-24-2012   GRADE I DIASTOLIC DYSFUNCTION/  EF 55-60%/  MILD MR  . UPPER GASTROINTESTINAL ENDOSCOPY      Family History  Problem Relation Age of Onset  . Rectal cancer Mother   . Colon cancer Mother   . Pancreatic cancer Mother   . Diabetes Mother   . Breast cancer Mother 7  . Breast cancer Maternal Aunt        breast  . Irritable bowel syndrome Son   . Heart disease Son        CAD, MV replacement  . Allergic Disorder Daughter   . Colon cancer Father   . Colon polyps Father   . Diabetes Father   . Stroke Father   . Heart disease Father        CHF  . Hyperlipidemia Father   . Hypertension Father   . Arthritis Father   . Breast cancer Paternal Aunt   . Breast cancer Paternal Aunt   . Arthritis Paternal Uncle   . Allergic Disorder Daughter   . Esophageal cancer Neg Hx   . Stomach cancer Neg Hx     Social History   Socioeconomic History  . Marital status: Married    Spouse name: [name]   . Number of children: 3  . Years of education: BA  . Highest education level: Not on file  Occupational History  . Occupation: Retired Programmer, multimedia: RETIRED  Social Needs  . Financial resource strain: Not on file  . Food insecurity    Worry: Not on file    Inability: Not on file  . Transportation needs    Medical: Not on file    Non-medical: Not on file  Tobacco Use  . Smoking status: Former Smoker    Packs/day:  1.00    Years: 15.00    Pack years: 15.00    Types: Cigarettes    Quit date: 05/27/2005    Years since quitting: 14.4  . Smokeless tobacco: Never Used  Substance and Sexual Activity  . Alcohol use: No    Alcohol/week: 0.0 standard drinks  . Drug use: No  . Sexual activity: Yes  Partners: Male    Comment: 1st intercourse 73 yo-Fewer than 5 partners  Lifestyle  . Physical activity    Days per week: Not on file    Minutes per session: Not on file  . Stress: Not on file  Relationships  . Social Herbalist on phone: Not on file    Gets together: Not on file    Attends religious service: Not on file    Active member of club or organization: Not on file    Attends meetings of clubs or organizations: Not on file    Relationship status: Not on file  . Intimate partner violence    Fear of current or ex partner: Not on file    Emotionally abused: Not on file    Physically abused: Not on file    Forced sexual activity: Not on file  Other Topics Concern  . Not on file  Social History Narrative   Lives with husband.   Caffeine use: 1/2 cup per day   Exercise-- 2days a week [employer],  Water aerobics, walking       Retired Marine scientist   No dietary restrictions, tries to maintain a heart healthy diet    Outpatient Medications Prior to Visit  Medication Sig Dispense Refill  . acetaminophen (TYLENOL) 500 MG tablet Take 1,000 mg by mouth every 6 (six) hours as needed for mild pain or headache.     . albuterol (PROVENTIL HFA;VENTOLIN HFA) 108 (90 BASE) MCG/ACT inhaler Inhale 2 puffs into the lungs every 4 (four) hours as needed. 1 Inhaler 5  . amLODipine (NORVASC) 5 MG tablet amlodipine 5 mg tablet    . aspirin EC 81 MG tablet Take 1 tablet (81 mg total) by mouth daily. 90 tablet 3  . atenolol (TENORMIN) 25 MG tablet TAKE 1 TABLET BY MOUTH THREE TIMES DAILY BEFORE MEAL(S) 270 tablet 2  . denosumab (PROLIA) 60 MG/ML SOSY injection Inject 60 mg into the skin every 6 (six) months.    .  DULoxetine (CYMBALTA) 60 MG capsule Take 2 capsules (120 mg total) by mouth daily. 90 capsule 1  . Evolocumab (REPATHA SURECLICK) 428 MG/ML SOAJ Inject 140 mg into the skin every 14 (fourteen) days. 2 pen 11  . fluticasone (FLONASE) 50 MCG/ACT nasal spray Place 2 sprays into both nostrils daily. 16 g 5  . furosemide (LASIX) 20 MG tablet Take 1 tablet (20 mg total) by mouth as needed for edema. 90 tablet 3  . gabapentin (NEURONTIN) 300 MG capsule TAKE 1 CAPSULE (300 MG TOTAL) BY MOUTH TWICE DAILY 180 capsule 0  . linaclotide (LINZESS) 145 MCG CAPS capsule Take 1 capsule (145 mcg total) by mouth daily before breakfast. 30 capsule 5  . loratadine (CLARITIN) 10 MG tablet Take 1 tablet (10 mg total) by mouth daily. 30 tablet 11  . losartan (COZAAR) 100 MG tablet Take 1 tablet by mouth once daily 90 tablet 1  . mupirocin ointment (BACTROBAN) 2 % Apply thin film twice daily. (Patient not taking: Reported on 11/07/2019) 22 g 0  . nitroGLYCERIN (NITROSTAT) 0.4 MG SL tablet Place 1 tablet (0.4 mg total) under the tongue every 5 (five) minutes as needed for chest pain. 25 tablet 1  . omeprazole (PRILOSEC) 40 MG capsule Take 1 capsule (40 mg total) by mouth 2 (two) times daily. 90 capsule 1  . pentosan polysulfate (ELMIRON) 100 MG capsule Take 1 capsule (100 mg total) by mouth 3 (three) times daily. Reported on 01/05/2016 (Patient taking differently:  Take 100 mg by mouth 3 (three) times daily with meals as needed. Reported on 01/05/2016) 90 capsule 11  . rOPINIRole (REQUIP) 0.25 MG tablet Take 1 tablet (0.25 mg total) by mouth 3 (three) times daily. 30 tablet 5  . traMADol (ULTRAM) 50 MG tablet tramadol 50 mg tablet  TAKE 1 TABLET BY MOUTH THREE TIMES DAILY AS NEEDED    . traZODone (DESYREL) 100 MG tablet Take 1 tablet (100 mg total) by mouth at bedtime. 90 tablet 1  . Estradiol 10 MCG TABS vaginal tablet Place 1 tablet (10 mcg total) vaginally 2 (two) times a week. 8 tablet 11  . HYDROcodone-acetaminophen  (NORCO/VICODIN) 5-325 MG tablet Take 1 tablet by mouth every 6 (six) hours as needed for moderate pain.    Marland Kitchen ondansetron (ZOFRAN) 4 MG tablet TAKE 1 TABLET BY MOUTH EVERY 8 HOURS AS NEEDED    . traMADol (ULTRAM) 50 MG tablet Take 1-2 tablets (50-100 mg total) by mouth 3 (three) times daily as needed for moderate pain. (Patient taking differently: Take 50-100 mg by mouth 3 (three) times daily as needed for moderate pain (pt uses TID). ) 180 tablet 0  . traZODone (DESYREL) 100 MG tablet TAKE 1 TABLET BY MOUTH AT BEDTIME AS NEEDED     Facility-Administered Medications Prior to Visit  Medication Dose Route Frequency Provider Last Rate Last Dose  . bupivacaine (MARCAINE) 0.5 % 10 mL, triamcinolone acetonide (KENALOG-40) 40 mg injection   Subcutaneous Once Carolan Clines, MD      . bupivacaine (MARCAINE) 0.5 % 15 mL, phenazopyridine (PYRIDIUM) 400 mg bladder mixture   Bladder Instillation Once Carolan Clines, MD        Allergies  Allergen Reactions  . Clindamycin Hcl Shortness Of Breath and Rash  . Penicillins Anaphylaxis  . Prednisone Shortness Of Breath and Rash  . Baclofen Other (See Comments) and Rash  . Cortisone Other (See Comments)  . Lincomycin Other (See Comments)  . Codeine Hives and Other (See Comments)    headache  . Erythromycin Base Other (See Comments)    other  . Fluzone [Flu Virus Vaccine] Other (See Comments)    Local reaction at the site  . Haemophilus Influenzae Other (See Comments)    Local reaction at the site Local reaction at the site  . Latex Hives  . Pentazocine Lactate Other (See Comments)    HALLUCINATION  . Pneumococcal Vaccine Polyvalent Hives, Swelling and Other (See Comments)    REACTION: redness, swelling, and hives at injection site  . Tamoxifen Nausea And Vomiting and Other (See Comments)    HEADACHE    Review of Systems  Constitutional: Negative for fever and malaise/fatigue.  HENT: Positive for congestion and sinus pain.   Eyes:  Negative for blurred vision.  Respiratory: Positive for sputum production. Negative for shortness of breath.   Cardiovascular: Negative for chest pain, palpitations and leg swelling.  Gastrointestinal: Negative for abdominal pain, blood in stool and nausea.  Genitourinary: Negative for dysuria and frequency.  Musculoskeletal: Positive for joint pain. Negative for falls.  Skin: Negative for rash.  Neurological: Negative for dizziness, loss of consciousness and headaches.  Endo/Heme/Allergies: Negative for environmental allergies.  Psychiatric/Behavioral: Negative for depression. The patient is not nervous/anxious.        Objective:    Physical Exam Constitutional:      Appearance: Normal appearance. She is not ill-appearing.  HENT:     Head: Normocephalic and atraumatic.     Nose: Nose normal.  Eyes:  General:        Right eye: No discharge.        Left eye: No discharge.  Pulmonary:     Effort: Pulmonary effort is normal.  Neurological:     Mental Status: She is alert and oriented to person, place, and time.  Psychiatric:        Mood and Affect: Mood normal.        Behavior: Behavior normal.     BP 125/90   Pulse 70   Wt 134 lb (60.8 kg)   SpO2 95%   BMI 22.30 kg/m  Wt Readings from Last 3 Encounters:  11/07/19 138 lb (62.6 kg)  11/07/19 134 lb (60.8 kg)  09/25/19 131 lb 6.4 oz (59.6 kg)    Diabetic Foot Exam - Simple   No data filed     Lab Results  Component Value Date   WBC 6.8 07/14/2019   HGB 12.5 07/14/2019   HCT 38.8 07/14/2019   PLT 296.0 07/14/2019   GLUCOSE 87 07/14/2019   CHOL 130 07/14/2019   TRIG 145.0 07/14/2019   HDL 46.80 07/14/2019   LDLDIRECT 117.3 04/08/2012   LDLCALC 54 07/14/2019   ALT 10 07/14/2019   AST 15 07/14/2019   NA 139 07/14/2019   K 4.7 07/14/2019   CL 103 07/14/2019   CREATININE 1.03 07/14/2019   BUN 19 07/14/2019   CO2 31 07/14/2019   TSH 2.37 07/14/2019   INR 1.05 08/03/2010   HGBA1C 5.8 03/01/2015    MICROALBUR 0.2 07/10/2013    Lab Results  Component Value Date   TSH 2.37 07/14/2019   Lab Results  Component Value Date   WBC 6.8 07/14/2019   HGB 12.5 07/14/2019   HCT 38.8 07/14/2019   MCV 93.7 07/14/2019   PLT 296.0 07/14/2019   Lab Results  Component Value Date   NA 139 07/14/2019   K 4.7 07/14/2019   CHLORIDE 102 04/20/2015   CO2 31 07/14/2019   GLUCOSE 87 07/14/2019   BUN 19 07/14/2019   CREATININE 1.03 07/14/2019   BILITOT 0.3 07/14/2019   ALKPHOS 86 07/14/2019   AST 15 07/14/2019   ALT 10 07/14/2019   PROT 6.3 07/14/2019   ALBUMIN 4.0 07/14/2019   CALCIUM 8.9 07/14/2019   ANIONGAP 12 (H) 04/20/2015   EGFR 49 (L) 04/20/2015   GFR 52.38 (L) 07/14/2019   Lab Results  Component Value Date   CHOL 130 07/14/2019   Lab Results  Component Value Date   HDL 46.80 07/14/2019   Lab Results  Component Value Date   LDLCALC 54 07/14/2019   Lab Results  Component Value Date   TRIG 145.0 07/14/2019   Lab Results  Component Value Date   CHOLHDL 3 07/14/2019   Lab Results  Component Value Date   HGBA1C 5.8 03/01/2015       Assessment & Plan:   Problem List Items Addressed This Visit    Essential hypertension    Monitor vitals weekly and report concerns, no changes to meds. Encouraged heart healthy diet such as the DASH diet and exercise as tolerated.       Renal insufficiency    Hydrate and monitor      Hyperlipidemia    Encouraged heart healthy diet, increase exercise, avoid trans fats, consider a krill oil cap daily      Right arm pain    Encouraged moist heat and gentle stretching as tolerated. May try NSAIDs and prescription meds as directed and report if  symptoms worsen or seek immediate care. Has been following with [facility] but does note a new raised lesion on her distal arm. She will come in this week for evaluation.      Recurrent sinusitis    Increased congestion, discharge and more. dtreat with Doxycycline.       Relevant  Medications   doxycycline (VIBRA-TABS) 100 MG tablet   RLS (restless legs syndrome)    She was started on Requip by another provider and while it helps her discomfort some it keeps her awake. Will try switching to Mirapex.          I am having [name] start on doxycycline and pramipexole. I am also having her maintain her acetaminophen, aspirin EC, albuterol, loratadine, pentosan polysulfate, denosumab, linaclotide, mupirocin ointment, losartan, nitroGLYCERIN, furosemide, fluticasone, omeprazole, traZODone, atenolol, Repatha SureClick, DULoxetine, gabapentin, traMADol, amLODipine, and rOPINIRole.  Meds ordered this encounter  Medications  . doxycycline (VIBRA-TABS) 100 MG tablet    Sig: Take 1 tablet (100 mg total) by mouth 2 (two) times daily.    Dispense:  20 tablet    Refill:  0  . pramipexole (MIRAPEX) 0.125 MG tablet    Sig: Take 1-2 tablets (0.125-0.25 mg total) by mouth 3 (three) times daily.    Dispense:  60 tablet    Refill:  1      I discussed the assessment and treatment plan with the patient. The patient was provided an opportunity to ask questions and all were answered. The patient agreed with the plan and demonstrated an understanding of the instructions.   The patient was advised to call back or seek an in-person evaluation if the symptoms worsen or if the condition fails to improve as anticipated.  I provided 25 minutes of non-face-to-face time during this encounter.   [name], MD

## 2019-11-09 NOTE — Assessment & Plan Note (Signed)
Increased congestion, discharge and more. dtreat with Doxycycline.

## 2019-11-13 NOTE — Telephone Encounter (Signed)
Deductible n/a  OOP MAX $[phone] [phone])  Annual exam 11/07/2019 TF  Calcium  8.9           Date  07/14/2019  Upcoming dental procedures   Prior Authorization needed NO PRIOR AUTH PER [employer] [id number]  Pt estimated Cost $213    APPT 11/25/2019 @ 10:30   Coverage Details:20% ONE DOSE,0% ADMIN FEE

## 2019-11-24 ENCOUNTER — Other Ambulatory Visit: Payer: Self-pay | Admitting: Family Medicine

## 2019-11-25 ENCOUNTER — Ambulatory Visit: Payer: Medicare Other

## 2019-11-26 ENCOUNTER — Ambulatory Visit: Payer: Medicare Other

## 2019-12-02 ENCOUNTER — Ambulatory Visit (INDEPENDENT_AMBULATORY_CARE_PROVIDER_SITE_OTHER): Payer: Medicare Other | Admitting: Gynecology

## 2019-12-02 ENCOUNTER — Other Ambulatory Visit: Payer: Self-pay

## 2019-12-02 DIAGNOSIS — M810 Age-related osteoporosis without current pathological fracture: Secondary | ICD-10-CM

## 2019-12-02 MED ORDER — DENOSUMAB 60 MG/ML ~~LOC~~ SOSY
60.0000 mg | PREFILLED_SYRINGE | Freq: Once | SUBCUTANEOUS | Status: AC
  Administered 2019-12-02: 60 mg via SUBCUTANEOUS

## 2019-12-11 ENCOUNTER — Telehealth: Payer: Self-pay

## 2019-12-11 NOTE — Telephone Encounter (Signed)
Called and lmomed pt regarding if her insurance changed... will await callback

## 2019-12-23 DIAGNOSIS — M5030 Other cervical disc degeneration, unspecified cervical region: Secondary | ICD-10-CM | POA: Diagnosis not present

## 2019-12-23 DIAGNOSIS — M5136 Other intervertebral disc degeneration, lumbar region: Secondary | ICD-10-CM | POA: Diagnosis not present

## 2019-12-23 DIAGNOSIS — Z79891 Long term (current) use of opiate analgesic: Secondary | ICD-10-CM | POA: Diagnosis not present

## 2019-12-30 ENCOUNTER — Other Ambulatory Visit: Payer: Self-pay | Admitting: Cardiology

## 2019-12-30 DIAGNOSIS — I10 Essential (primary) hypertension: Secondary | ICD-10-CM

## 2020-01-04 DIAGNOSIS — Z20822 Contact with and (suspected) exposure to covid-19: Secondary | ICD-10-CM | POA: Diagnosis not present

## 2020-01-06 ENCOUNTER — Other Ambulatory Visit: Payer: Self-pay | Admitting: Family Medicine

## 2020-01-09 DIAGNOSIS — M545 Low back pain: Secondary | ICD-10-CM | POA: Diagnosis not present

## 2020-01-09 DIAGNOSIS — M5136 Other intervertebral disc degeneration, lumbar region: Secondary | ICD-10-CM | POA: Diagnosis not present

## 2020-01-09 DIAGNOSIS — G8929 Other chronic pain: Secondary | ICD-10-CM | POA: Diagnosis not present

## 2020-01-09 DIAGNOSIS — M419 Scoliosis, unspecified: Secondary | ICD-10-CM | POA: Diagnosis not present

## 2020-01-13 NOTE — Telephone Encounter (Signed)
PROLIA GIVEN 12/02/2019 NEXT INJECTION 06/02/2020

## 2020-01-16 ENCOUNTER — Telehealth: Payer: Self-pay

## 2020-01-16 NOTE — Telephone Encounter (Signed)
Patient called in needing to know if it is safe for her to get the Covid-19 Shot due to her having really bad allergies and problems breathing. Can you please follow up with the patient at [phone]

## 2020-01-16 NOTE — Telephone Encounter (Signed)
Patient made aware she agreed

## 2020-01-16 NOTE — Telephone Encounter (Signed)
Spoke with patient she says she has really bad allergies and reactions to things should she take Benadryl before going Please advise

## 2020-01-16 NOTE — Telephone Encounter (Signed)
Yes she should go ahead and take it. Covid 19 is much more dangerous to her than the shot. We only see 11 anaphylaxis cases per 1 million doses. They will observe her for 30 minutes to make sure no serious reaction.

## 2020-01-20 DIAGNOSIS — G894 Chronic pain syndrome: Secondary | ICD-10-CM | POA: Diagnosis not present

## 2020-01-22 DIAGNOSIS — I8310 Varicose veins of unspecified lower extremity with inflammation: Secondary | ICD-10-CM | POA: Diagnosis not present

## 2020-01-22 DIAGNOSIS — L304 Erythema intertrigo: Secondary | ICD-10-CM | POA: Diagnosis not present

## 2020-01-22 DIAGNOSIS — L720 Epidermal cyst: Secondary | ICD-10-CM | POA: Diagnosis not present

## 2020-01-22 DIAGNOSIS — L309 Dermatitis, unspecified: Secondary | ICD-10-CM | POA: Diagnosis not present

## 2020-01-24 ENCOUNTER — Ambulatory Visit: Payer: Medicare Other

## 2020-01-29 ENCOUNTER — Ambulatory Visit: Payer: Medicare Other

## 2020-01-29 ENCOUNTER — Ambulatory Visit: Payer: Medicare Other | Attending: Internal Medicine

## 2020-01-29 DIAGNOSIS — Z23 Encounter for immunization: Secondary | ICD-10-CM

## 2020-01-29 NOTE — Progress Notes (Signed)
   Covid-19 Vaccination Clinic  Name:  [name]    MRN: [id number] DOB: Dec 09, 1946  01/29/2020  [name] was observed post Covid-19 immunization for 30 minutes based on pre-vaccination screening without incidence. She was provided with Vaccine Information Sheet and instruction to access the V-Safe system.   [name] was instructed to call 911 with any severe reactions post vaccine: Marland Kitchen Difficulty breathing  . Swelling of your face and throat  . A fast heartbeat  . A bad rash all over your body  . Dizziness and weakness    Immunizations Administered    Name Date Dose VIS Date Route   Pfizer COVID-19 Vaccine 01/29/2020  9:46 AM 0.3 mL 12/05/2019 Intramuscular   Manufacturer: [place]   Lot: [id number]   [place]: [id number]

## 2020-02-23 ENCOUNTER — Ambulatory Visit: Payer: Medicare Other | Attending: Internal Medicine

## 2020-02-23 ENCOUNTER — Other Ambulatory Visit: Payer: Self-pay

## 2020-02-23 ENCOUNTER — Other Ambulatory Visit: Payer: Self-pay | Admitting: Family Medicine

## 2020-02-23 DIAGNOSIS — Z23 Encounter for immunization: Secondary | ICD-10-CM | POA: Insufficient documentation

## 2020-02-23 NOTE — Progress Notes (Addendum)
   Covid-19 Vaccination Clinic  Name:  [name]    MRN: [id number] DOB: Oct 16, 1946  02/23/2020  [name] was observed post Covid-19 immunization for 30 min without incidence. She was provided with Vaccine Information Sheet and instruction to access the V-Safe system. Patient complained of redness and swelling to injection site, area assessed by RN, Benadryl given per standing order, patient denies SOB, no  severe reactions observed. Patient voices that she has swelling with other IM injections and normally takes Benadryl before shots. She ambulated out the building with no issues or complaints.   [name] was instructed to call 911 with any severe reactions post vaccine: Marland Kitchen Difficulty breathing  . Swelling of your face and throat  . A fast heartbeat  . A bad rash all over your body  . Dizziness and weakness    Immunizations Administered    Name Date Dose VIS Date Route   Pfizer COVID-19 Vaccine 02/23/2020  9:39 AM 0.3 mL 12/05/2019 Intramuscular   Manufacturer: [place]   Lot: [id number]   [place]: [id number]

## 2020-02-26 ENCOUNTER — Other Ambulatory Visit: Payer: Self-pay

## 2020-02-26 ENCOUNTER — Ambulatory Visit (INDEPENDENT_AMBULATORY_CARE_PROVIDER_SITE_OTHER): Payer: Medicare Other | Admitting: Family Medicine

## 2020-02-26 ENCOUNTER — Encounter: Payer: Self-pay | Admitting: Family Medicine

## 2020-02-26 VITALS — BP 135/76 | HR 77 | Temp 96.9°F | Resp 16 | Ht 64.5 in | Wt 136.0 lb

## 2020-02-26 DIAGNOSIS — M791 Myalgia, unspecified site: Secondary | ICD-10-CM | POA: Diagnosis not present

## 2020-02-26 DIAGNOSIS — E785 Hyperlipidemia, unspecified: Secondary | ICD-10-CM | POA: Diagnosis not present

## 2020-02-26 DIAGNOSIS — M810 Age-related osteoporosis without current pathological fracture: Secondary | ICD-10-CM

## 2020-02-26 DIAGNOSIS — N289 Disorder of kidney and ureter, unspecified: Secondary | ICD-10-CM | POA: Diagnosis not present

## 2020-02-26 DIAGNOSIS — M2550 Pain in unspecified joint: Secondary | ICD-10-CM

## 2020-02-26 DIAGNOSIS — Z789 Other specified health status: Secondary | ICD-10-CM

## 2020-02-26 DIAGNOSIS — I10 Essential (primary) hypertension: Secondary | ICD-10-CM

## 2020-02-26 DIAGNOSIS — M797 Fibromyalgia: Secondary | ICD-10-CM

## 2020-02-26 DIAGNOSIS — M79605 Pain in left leg: Secondary | ICD-10-CM

## 2020-02-26 DIAGNOSIS — R109 Unspecified abdominal pain: Secondary | ICD-10-CM

## 2020-02-26 DIAGNOSIS — R768 Other specified abnormal immunological findings in serum: Secondary | ICD-10-CM

## 2020-02-26 LAB — COMPREHENSIVE METABOLIC PANEL
ALT: 11 U/L (ref 0–35)
AST: 19 U/L (ref 0–37)
Albumin: 3.9 g/dL (ref 3.5–5.2)
Alkaline Phosphatase: 80 U/L (ref 39–117)
BUN: 13 mg/dL (ref 6–23)
CO2: 31 mEq/L (ref 19–32)
Calcium: 9.2 mg/dL (ref 8.4–10.5)
Chloride: 103 mEq/L (ref 96–112)
Creatinine, Ser: 1.03 mg/dL (ref 0.40–1.20)
GFR: 52.29 mL/min — ABNORMAL LOW (ref >60.00)
Glucose, Bld: 94 mg/dL (ref 70–99)
Potassium: 4.6 mEq/L (ref 3.5–5.1)
Sodium: 139 mEq/L (ref 135–145)
Total Bilirubin: 0.4 mg/dL (ref 0.2–1.2)
Total Protein: 6.6 g/dL (ref 6.0–8.3)

## 2020-02-26 LAB — URINALYSIS
Hgb urine dipstick: NEGATIVE
Leukocytes,Ua: NEGATIVE
Nitrite: NEGATIVE
Specific Gravity, Urine: 1.025 (ref 1.000–1.030)
Urine Glucose: NEGATIVE
Urobilinogen, UA: 1 (ref 0.0–1.0)
pH: 6.5 (ref 5.0–8.0)

## 2020-02-26 LAB — LIPID PANEL
Cholesterol: 129 mg/dL (ref 0–200)
HDL: 44.4 mg/dL (ref >39.00)
LDL Cholesterol: 60 mg/dL (ref 0–99)
NonHDL: 84.47
Total CHOL/HDL Ratio: 3
Triglycerides: 124 mg/dL (ref 0.0–149.0)
VLDL: 24.8 mg/dL (ref 0.0–40.0)

## 2020-02-26 LAB — CBC WITH DIFFERENTIAL/PLATELET
Basophils Absolute: 0.1 10*3/uL (ref 0.0–0.1)
Basophils Relative: 1.1 % (ref 0.0–3.0)
Eosinophils Absolute: 0.3 10*3/uL (ref 0.0–0.7)
Eosinophils Relative: 4.6 % (ref 0.0–5.0)
HCT: 39.2 % (ref 36.0–46.0)
Hemoglobin: 12.7 g/dL (ref 12.0–15.0)
Lymphocytes Relative: 19.4 % (ref 12.0–46.0)
Lymphs Abs: 1.5 10*3/uL (ref 0.7–4.0)
MCHC: 32.3 g/dL (ref 30.0–36.0)
MCV: 94.2 fl (ref 78.0–100.0)
Monocytes Absolute: 0.6 10*3/uL (ref 0.1–1.0)
Monocytes Relative: 8.3 % (ref 3.0–12.0)
Neutro Abs: 5.1 10*3/uL (ref 1.4–7.7)
Neutrophils Relative %: 66.6 % (ref 43.0–77.0)
Platelets: 269 10*3/uL (ref 150.0–400.0)
RBC: 4.17 Mil/uL (ref 3.87–5.11)
RDW: 15.6 % — ABNORMAL HIGH (ref 11.5–15.5)
WBC: 7.6 10*3/uL (ref 4.0–10.5)

## 2020-02-26 LAB — TSH: TSH: 1.76 u[IU]/mL (ref 0.35–4.50)

## 2020-02-26 LAB — SEDIMENTATION RATE: Sed Rate: 10 mm/hr (ref 0–30)

## 2020-02-26 LAB — C-REACTIVE PROTEIN: CRP: 2.1 mg/dL (ref 0.5–20.0)

## 2020-02-26 MED ORDER — HYOSCYAMINE SULFATE 0.125 MG SL SUBL: 0.1250 mg | SUBLINGUAL_TABLET | SUBLINGUAL | 0 refills | Status: DC | PRN

## 2020-02-26 NOTE — Patient Instructions (Addendum)
[facility] Blood Pressure cuff, upper arm, want BP 100-140/60-90 Pulse oximeter, want oxygen in 90s  Weekly vitals  Take Multivitamin with minerals, selenium Vitamin D 1000-2000 IU daily Probiotic with lactobacillus and bifidophilus Asprin EC 81 mg daily  Melatonin 2-5 mg at bedtime  [email or web address] Hypertension, Adult High blood pressure (hypertension) is when the force of blood pumping through the arteries is too strong. The arteries are the blood vessels that carry blood from the heart throughout the body. Hypertension forces the heart to work harder to pump blood and may cause arteries to become narrow or stiff. Untreated or uncontrolled hypertension can cause a heart attack, heart failure, a stroke, kidney disease, and other problems. A blood pressure reading consists of a higher number over a lower number. Ideally, your blood pressure should be below 120/80. The first ("top") number is called the systolic pressure. It is a measure of the pressure in your arteries as your heart beats. The second ("bottom") number is called the diastolic pressure. It is a measure of the pressure in your arteries as the heart relaxes. What are the causes? The exact cause of this condition is not known. There are some conditions that result in or are related to high blood pressure. What increases the risk? Some risk factors for high blood pressure are under your control. The following factors may make you more likely to develop this condition:  Smoking.  Having type 2 diabetes mellitus, high cholesterol, or both.  Not getting enough exercise or physical activity.  Being overweight.  Having too much fat, sugar, calories, or salt (sodium) in your diet.  Drinking too much alcohol. Some risk factors for high blood pressure may be difficult or impossible to change. Some of these factors include:  Having chronic kidney disease.  Having a family history of high blood  pressure.  Age. Risk increases with age.  Race. You may be at higher risk if you are African American.  Gender. Men are at higher risk than women before age 64. After age 75, women are at higher risk than men.  Having obstructive sleep apnea.  Stress. What are the signs or symptoms? High blood pressure may not cause symptoms. Very high blood pressure (hypertensive crisis) may cause:  Headache.  Anxiety.  Shortness of breath.  Nosebleed.  Nausea and vomiting.  Vision changes.  Severe chest pain.  Seizures. How is this diagnosed? This condition is diagnosed by measuring your blood pressure while you are seated, with your arm resting on a flat surface, your legs uncrossed, and your feet flat on the floor. The cuff of the blood pressure monitor will be placed directly against the skin of your upper arm at the level of your heart. It should be measured at least twice using the same arm. Certain conditions can cause a difference in blood pressure between your right and left arms. Certain factors can cause blood pressure readings to be lower or higher than normal for a short period of time:  When your blood pressure is higher when you are in a health care provider's office than when you are at home, this is called white coat hypertension. Most people with this condition do not need medicines.  When your blood pressure is higher at home than when you are in a health care provider's office, this is called masked hypertension. Most people with this condition may need medicines to control blood pressure. If you have a high blood pressure reading during one visit or you have normal  blood pressure with other risk factors, you may be asked to:  Return on a different day to have your blood pressure checked again.  Monitor your blood pressure at home for 1 week or longer. If you are diagnosed with hypertension, you may have other blood or imaging tests to help your health care provider  understand your overall risk for other conditions. How is this treated? This condition is treated by making healthy lifestyle changes, such as eating healthy foods, exercising more, and reducing your alcohol intake. Your health care provider may prescribe medicine if lifestyle changes are not enough to get your blood pressure under control, and if:  Your systolic blood pressure is above 130.  Your diastolic blood pressure is above 80. Your personal target blood pressure may vary depending on your medical conditions, your age, and other factors. Follow these instructions at home: Eating and drinking   Eat a diet that is high in fiber and potassium, and low in sodium, added sugar, and fat. An example eating plan is called the DASH (Dietary Approaches to Stop Hypertension) diet. To eat this way: ? Eat plenty of fresh fruits and vegetables. Try to fill one half of your plate at each meal with fruits and vegetables. ? Eat whole grains, such as whole-wheat pasta, brown rice, or whole-grain bread. Fill about one fourth of your plate with whole grains. ? Eat or drink low-fat dairy products, such as skim milk or low-fat yogurt. ? Avoid fatty cuts of meat, processed or cured meats, and poultry with skin. Fill about one fourth of your plate with lean proteins, such as fish, chicken without skin, beans, eggs, or tofu. ? Avoid pre-made and processed foods. These tend to be higher in sodium, added sugar, and fat.  Reduce your daily sodium intake. Most people with hypertension should eat less than 1,500 mg of sodium a day.  Do not drink alcohol if: ? Your health care provider tells you not to drink. ? You are pregnant, may be pregnant, or are planning to become pregnant.  If you drink alcohol: ? Limit how much you use to:  0-1 drink a day for women.  0-2 drinks a day for men. ? Be aware of how much alcohol is in your drink. In the [place] one drink equals one 12 oz bottle of beer (355 mL), one 5 oz  glass of wine (148 mL), or one 1 oz glass of hard liquor (44 mL). Lifestyle   Work with your health care provider to maintain a healthy body weight or to lose weight. Ask what an ideal weight is for you.  Get at least 30 minutes of exercise most days of the week. Activities may include walking, swimming, or biking.  Include exercise to strengthen your muscles (resistance exercise), such as Pilates or lifting weights, as part of your weekly exercise routine. Try to do these types of exercises for 30 minutes at least 3 days a week.  Do not use any products that contain nicotine or tobacco, such as cigarettes, e-cigarettes, and chewing tobacco. If you need help quitting, ask your health care provider.  Monitor your blood pressure at home as told by your health care provider.  Keep all follow-up visits as told by your health care provider. This is important. Medicines  Take over-the-counter and prescription medicines only as told by your health care provider. Follow directions carefully. Blood pressure medicines must be taken as prescribed.  Do not skip doses of blood pressure medicine. Doing this puts  you at risk for problems and can make the medicine less effective.  Ask your health care provider about side effects or reactions to medicines that you should watch for. Contact a health care provider if you:  Think you are having a reaction to a medicine you are taking.  Have headaches that keep coming back (recurring).  Feel dizzy.  Have swelling in your ankles.  Have trouble with your vision. Get help right away if you:  Develop a severe headache or confusion.  Have unusual weakness or numbness.  Feel faint.  Have severe pain in your chest or abdomen.  Vomit repeatedly.  Have trouble breathing. Summary  Hypertension is when the force of blood pumping through your arteries is too strong. If this condition is not controlled, it may put you at risk for serious  complications.  Your personal target blood pressure may vary depending on your medical conditions, your age, and other factors. For most people, a normal blood pressure is less than 120/80.  Hypertension is treated with lifestyle changes, medicines, or a combination of both. Lifestyle changes include losing weight, eating a healthy, low-sodium diet, exercising more, and limiting alcohol. This information is not intended to replace advice given to you by your health care provider. Make sure you discuss any questions you have with your health care provider. Document Revised: 08/21/2018 Document Reviewed: 08/21/2018 Elsevier Patient Education  2020 [employer].

## 2020-02-26 NOTE — Assessment & Plan Note (Signed)
Well controlled, no changes to meds. Encouraged heart healthy diet such as the DASH diet and exercise as tolerated.  °

## 2020-02-28 LAB — RHEUMATOID FACTOR: Rheumatoid fact SerPl-aCnc: <14 IU/mL (ref <14)

## 2020-02-28 LAB — URINE CULTURE
MICRO NUMBER:: 10214548
Result:: NO GROWTH
SPECIMEN QUALITY:: ADEQUATE

## 2020-02-28 LAB — ANTI-NUCLEAR AB-TITER (ANA TITER): ANA Titer 1: 1:40 {titer} — ABNORMAL HIGH

## 2020-02-28 LAB — ANA: Anti Nuclear Antibody (ANA): POSITIVE — AB

## 2020-02-29 DIAGNOSIS — Z789 Other specified health status: Secondary | ICD-10-CM

## 2020-02-29 HISTORY — DX: Other specified health status: Z78.9

## 2020-02-29 NOTE — Assessment & Plan Note (Signed)
LLQ and intermittent at times. No bloody or tarry stool. Try Hyoscyamine prn. Maintain high fiber diet and adequate fluid intake.

## 2020-02-29 NOTE — Progress Notes (Signed)
Subjective:    Patient ID: [name], female    DOB: 1946/08/11, 74 y.o.   MRN: [id number]  Chief Complaint  Patient presents with  . Body pain    Complains of pain throughout the body  . Mass    Complains of lumps theoughout the body  . Hypertension    "BP has been going up"    HPI Patient is in today for follow up on chronic medical concerns. She is most concerned about some firm tender nodules over lateral let knee, lateral lower left shin and bilateral medial elbows. No redness or warmth. No recent injury or trauma. No recent febrile illness or hospitalizations. She is following with pain management and is struggling with ongoing pain, back pain as well as arthralgias, myalgias. Tramadol no longer working for her. She also notes pain in her wrists and swelling in her hands. Denies CP/palp/SOB/HA/congestion/fevers/GI or GU c/o. Taking meds as prescribed  Past Medical History:  Diagnosis Date  . Allergy   . Anemia   . Anxiety    past hx   . Arthritis   . Asthma   . Atrial flutter ([place])    past history- not current  . CAD (coronary artery disease) CARDIOLOGIST--  DR Angelena Form   mild non-obstructive cad  . Cancer ([place])    right  . Cataract    bilaterally removed   . Chronic constipation   . Chronic kidney disease    interstitial cystitis  . COPD (chronic obstructive pulmonary disease) ([place])   . Depression    past hx   . Family history of malignant hyperthermia    father had this  . Fibromyalgia   . Frequency of urination   . GERD (gastroesophageal reflux disease)   . H/O hiatal hernia   . History of basal cell carcinoma excision    X2  . History of breast cancer ONCOLOGIST-- DR Jana Hakim---  NO RECURRANCE   DX 07/2012;  LOW GRADE DCIS  ER+PR+  ----  S/P RIGHT LUMPECTOMY WITH NEGATIVE MARGINS/   RADIATION ENDED 11/2012  . History of chronic bronchitis   . History of colonic polyps   . History of tachycardia    CONTROLLED  WITH ATENOLOL  . Hyperlipidemia   .  Hypertension   . Neuromuscular disorder (H[facility])    fibromyalgia  . Osteoporosis 01/2019   T score -2.2 stable/improved from prior study  . Pelvic pain   . Personal history of radiation therapy   . S/P radiation therapy 11/12/12 - 12/05/12   right Breast  . Sepsis ([place]) 2014   from UTI   . Sinus headache   . Urgency of urination     Past Surgical History:  Procedure Laterality Date  . [place] STUDY N/A 04/03/2016   Procedure: [place] STUDY;  Surgeon: Manus Gunning, MD;  Location: [facility];  Service: Gastroenterology;  Laterality: N/A;  . BREAST LUMPECTOMY Right 10-11-2012   W/ SLN BX  . CARDIAC CATHETERIZATION  09-13-2007  DR Lia Foyer   WELL-PRESERVED LVF/  DIFFUSE SCATTERED CORONARY CALCIFACATION AND ATHEROSCLEROSIS WITHOUT OBSTRUCTION  . CARDIAC CATHETERIZATION  08-04-2010  DR Angelena Form   NON-OBSTRUCTIVE CAD/  pLAD 40%/  oLAD 30%/  mLAD 30%/  pRCA 30%/  EF 60%  . CARDIOVASCULAR STRESS TEST  06-18-2012  DR McALHANY   LOW RISK NUCLEAR STUDY/  SMALL FIXED AREA OF MODERATELY DECREASED UPTAKE IN ANTEROSEPTAL WALL WHICH MAY BE ARTIFACTUAL/  NO ISCHEMIA/  EF 68%  . COLONOSCOPY  09-29-2010  . CYSTOSCOPY    . CYSTOSCOPY WITH HYDRODISTENSION AND BIOPSY N/A 03/06/2014   Procedure: CYSTOSCOPY/HYDRODISTENSION/ INSTILATION OF MARCAINE AND PYRIDIUM;  Surgeon: Ailene Rud, MD;  Location: [facility];  Service: Urology;  Laterality: N/A;  . ESOPHAGEAL MANOMETRY N/A 04/03/2016   Procedure: ESOPHAGEAL MANOMETRY (EM);  Surgeon: Manus Gunning, MD;  Location: [facility];  Service: Gastroenterology;  Laterality: N/A;  . EXTRACORPOREAL SHOCK WAVE LITHOTRIPSY Left 02/06/2019   Procedure: EXTRACORPOREAL SHOCK WAVE LITHOTRIPSY (ESWL);  Surgeon: Kathie Rhodes, MD;  Location: [facility];  Service: Urology;  Laterality: Left;  . NASAL SINUS SURGERY  1985  . ORIF RIGHT ANKLE  FX  2006  . POLYPECTOMY    . REMOVAL VOCAL CORD CYST  FEB 2014  . RIGHT BREAST BX   08-23-2012  . RIGHT HAND SURGERY  X3  LAST ONE 2009   INCLUDES  ORIF RIGHT 5TH FINGER AND REVISION TWICE  . SKIN CANCER EXCISION    . TONSILLECTOMY AND ADENOIDECTOMY  AGE 30  . TOTAL ABDOMINAL HYSTERECTOMY W/ BILATERAL SALPINGOOPHORECTOMY  1982   W/  APPENDECTOMY  . TRANSTHORACIC ECHOCARDIOGRAM  06-24-2012   GRADE I DIASTOLIC DYSFUNCTION/  EF 55-60%/  MILD MR  . UPPER GASTROINTESTINAL ENDOSCOPY      Family History  Problem Relation Age of Onset  . Rectal cancer Mother   . Colon cancer Mother   . Pancreatic cancer Mother   . Diabetes Mother   . Breast cancer Mother 10  . Breast cancer Maternal Aunt        breast  . Irritable bowel syndrome Son   . Heart disease Son        CAD, MV replacement  . Allergic Disorder Daughter   . Colon cancer Father   . Colon polyps Father   . Diabetes Father   . Stroke Father   . Heart disease Father        CHF  . Hyperlipidemia Father   . Hypertension Father   . Arthritis Father   . Breast cancer Paternal Aunt   . Breast cancer Paternal Aunt   . Arthritis Paternal Uncle   . Allergic Disorder Daughter   . Esophageal cancer Neg Hx   . Stomach cancer Neg Hx     Social History   Socioeconomic History  . Marital status: Married    Spouse name: Jori Moll   . Number of children: 3  . Years of education: BA  . Highest education level: Not on file  Occupational History  . Occupation: Retired Programmer, multimedia: RETIRED  Tobacco Use  . Smoking status: Former Smoker    Packs/day: 1.00    Years: 15.00    Pack years: 15.00    Types: Cigarettes    Quit date: 05/27/2005    Years since quitting: 14.7  . Smokeless tobacco: Never Used  Substance and Sexual Activity  . Alcohol use: No    Alcohol/week: 0.0 standard drinks  . Drug use: No  . Sexual activity: Yes    Partners: Male    Comment: 1st intercourse 74 yo-Fewer than 5 partners  Other Topics Concern  . Not on file  Social History Narrative   Lives with husband.   Caffeine use: 1/2 cup per  day   Exercise-- 2days a week [employer],  Water aerobics, walking       Retired Marine scientist   No dietary restrictions, tries to maintain a heart healthy diet   Social Determinants of Health  Financial Resource Strain:   . Difficulty of Paying Living Expenses: Not on file  Food Insecurity:   . Worried About Charity fundraiser in the Last Year: Not on file  . Ran Out of Food in the Last Year: Not on file  Transportation Needs:   . Lack of Transportation (Medical): Not on file  . Lack of Transportation (Non-Medical): Not on file  Physical Activity:   . Days of Exercise per Week: Not on file  . Minutes of Exercise per Session: Not on file  Stress:   . Feeling of Stress : Not on file  Social Connections:   . Frequency of Communication with Friends and Family: Not on file  . Frequency of Social Gatherings with Friends and Family: Not on file  . Attends Religious Services: Not on file  . Active Member of Clubs or Organizations: Not on file  . Attends Archivist Meetings: Not on file  . Marital Status: Not on file  Intimate Partner Violence:   . Fear of Current or Ex-Partner: Not on file  . Emotionally Abused: Not on file  . Physically Abused: Not on file  . Sexually Abused: Not on file    Outpatient Medications Prior to Visit  Medication Sig Dispense Refill  . acetaminophen (TYLENOL) 500 MG tablet Take 1,000 mg by mouth every 6 (six) hours as needed for mild pain or headache.     . albuterol (PROVENTIL HFA;VENTOLIN HFA) 108 (90 BASE) MCG/ACT inhaler Inhale 2 puffs into the lungs every 4 (four) hours as needed. 1 Inhaler 5  . amLODipine (NORVASC) 5 MG tablet amlodipine 5 mg tablet    . aspirin EC 81 MG tablet Take 1 tablet (81 mg total) by mouth daily. 90 tablet 3  . atenolol (TENORMIN) 25 MG tablet TAKE 1 TABLET BY MOUTH THREE TIMES DAILY BEFORE MEAL(S) 270 tablet 2  . denosumab (PROLIA) 60 MG/ML SOSY injection Inject 60 mg into the skin every 6 (six) months.    . DULoxetine  (CYMBALTA) 60 MG capsule Take 2 capsules by mouth once daily 90 capsule 0  . Estradiol 10 MCG TABS vaginal tablet Place 1 tablet (10 mcg total) vaginally 2 (two) times a week. 24 tablet 4  . Evolocumab (REPATHA SURECLICK) 657 MG/ML SOAJ Inject 140 mg into the skin every 14 (fourteen) days. 2 pen 11  . fluticasone (FLONASE) 50 MCG/ACT nasal spray Place 2 sprays into both nostrils daily. 16 g 5  . furosemide (LASIX) 20 MG tablet Take 1 tablet (20 mg total) by mouth as needed for edema. 90 tablet 3  . gabapentin (NEURONTIN) 300 MG capsule TAKE 1 CAPSULE (300 MG TOTAL) BY MOUTH TWICE DAILY 180 capsule 0  . linaclotide (LINZESS) 145 MCG CAPS capsule Take 1 capsule (145 mcg total) by mouth daily before breakfast. 30 capsule 5  . loratadine (CLARITIN) 10 MG tablet Take 1 tablet (10 mg total) by mouth daily. 30 tablet 11  . losartan (COZAAR) 100 MG tablet Take 1 tablet by mouth once daily 90 tablet 2  . mupirocin ointment (BACTROBAN) 2 % Apply thin film twice daily. 22 g 0  . nitroGLYCERIN (NITROSTAT) 0.4 MG SL tablet Place 1 tablet (0.4 mg total) under the tongue every 5 (five) minutes as needed for chest pain. 25 tablet 1  . omeprazole (PRILOSEC) 40 MG capsule Take 1 capsule by mouth twice daily 90 capsule 0  . pentosan polysulfate (ELMIRON) 100 MG capsule Take 1 capsule (100 mg total) by mouth 3 (  three) times daily. Reported on 01/05/2016 (Patient taking differently: Take 100 mg by mouth 3 (three) times daily with meals as needed. Reported on 01/05/2016) 90 capsule 11  . pramipexole (MIRAPEX) 0.125 MG tablet Take 1-2 tablets (0.125-0.25 mg total) by mouth 3 (three) times daily. 60 tablet 1  . rOPINIRole (REQUIP) 0.25 MG tablet Take 1 tablet (0.25 mg total) by mouth 3 (three) times daily. 30 tablet 5  . traZODone (DESYREL) 100 MG tablet Take 1 tablet (100 mg total) by mouth at bedtime. 90 tablet 1  . doxycycline (VIBRA-TABS) 100 MG tablet Take 1 tablet (100 mg total) by mouth 2 (two) times daily. 20 tablet 0   . traMADol (ULTRAM) 50 MG tablet tramadol 50 mg tablet  TAKE 1 TABLET BY MOUTH THREE TIMES DAILY AS NEEDED     Facility-Administered Medications Prior to Visit  Medication Dose Route Frequency Provider Last Rate Last Admin  . bupivacaine (MARCAINE) 0.5 % 10 mL, triamcinolone acetonide (KENALOG-40) 40 mg injection   Subcutaneous Once Carolan Clines, MD      . bupivacaine (MARCAINE) 0.5 % 15 mL, phenazopyridine (PYRIDIUM) 400 mg bladder mixture   Bladder Instillation Once Carolan Clines, MD        Allergies  Allergen Reactions  . Clindamycin Hcl Shortness Of Breath and Rash  . Penicillins Anaphylaxis  . Prednisone Shortness Of Breath and Rash  . Rosuvastatin Anaphylaxis  . Baclofen Other (See Comments) and Rash  . Cortisone Other (See Comments)  . Lincomycin Other (See Comments)  . Codeine Hives and Other (See Comments)    headache  . Erythromycin Base Other (See Comments)    other  . Fluzone [Flu Virus Vaccine] Other (See Comments)    Local reaction at the site  . Haemophilus Influenzae Other (See Comments)    Local reaction at the site Local reaction at the site  . Latex Hives  . Pentazocine Lactate Other (See Comments)    HALLUCINATION  . Pneumococcal Vaccine Polyvalent Hives, Swelling and Other (See Comments)    REACTION: redness, swelling, and hives at injection site  . Tamoxifen Nausea And Vomiting and Other (See Comments)    HEADACHE    Review of Systems  Constitutional: Positive for malaise/fatigue. Negative for fever.  HENT: Negative for congestion.   Eyes: Negative for blurred vision.  Respiratory: Negative for shortness of breath.   Cardiovascular: Negative for chest pain, palpitations and leg swelling.  Gastrointestinal: Positive for abdominal pain. Negative for blood in stool and nausea.  Genitourinary: Negative for dysuria and frequency.  Musculoskeletal: Positive for joint pain and myalgias. Negative for falls.  Skin: Negative for rash.    Neurological: Negative for dizziness, loss of consciousness and headaches.  Endo/Heme/Allergies: Negative for environmental allergies.  Psychiatric/Behavioral: Negative for depression. The patient is not nervous/anxious.        Objective:    Physical Exam Vitals and nursing note reviewed.  Constitutional:      General: She is not in acute distress.    Appearance: She is well-developed.  HENT:     Head: Normocephalic and atraumatic.     Nose: Nose normal.  Eyes:     General:        Right eye: No discharge.        Left eye: No discharge.  Cardiovascular:     Rate and Rhythm: Normal rate and regular rhythm.     Heart sounds: No murmur.  Pulmonary:     Effort: Pulmonary effort is normal.  Breath sounds: Normal breath sounds.  Abdominal:     General: Bowel sounds are normal.     Palpations: Abdomen is soft.     Tenderness: There is no abdominal tenderness.  Musculoskeletal:     Cervical back: Normal range of motion and neck supple.     Comments: Hard nodules lateral left knee, nodules medial elbows. Nodule lateral lower shin.  Skin:    General: Skin is warm and dry.  Neurological:     Mental Status: She is alert and oriented to person, place, and time.     BP 135/76 (BP Location: Left Arm, Patient Position: Sitting, Cuff Size: Small)   Pulse 77   Temp (!) 96.9 F (36.1 C) (Temporal)   Resp 16   Ht 5' 4.5" (1.638 m)   Wt 136 lb (61.7 kg)   SpO2 98%   BMI 22.98 kg/m  Wt Readings from Last 3 Encounters:  02/26/20 136 lb (61.7 kg)  11/07/19 138 lb (62.6 kg)  11/07/19 134 lb (60.8 kg)    Diabetic Foot Exam - Simple   No data filed     Lab Results  Component Value Date   WBC 7.6 02/26/2020   HGB 12.7 02/26/2020   HCT 39.2 02/26/2020   PLT 269.0 02/26/2020   GLUCOSE 94 02/26/2020   CHOL 129 02/26/2020   TRIG 124.0 02/26/2020   HDL 44.40 02/26/2020   LDLDIRECT 117.3 04/08/2012   LDLCALC 60 02/26/2020   ALT 11 02/26/2020   AST 19 02/26/2020   NA 139  02/26/2020   K 4.6 02/26/2020   CL 103 02/26/2020   CREATININE 1.03 02/26/2020   BUN 13 02/26/2020   CO2 31 02/26/2020   TSH 1.76 02/26/2020   INR 1.05 08/03/2010   HGBA1C 5.8 03/01/2015   MICROALBUR 0.2 07/10/2013    Lab Results  Component Value Date   TSH 1.76 02/26/2020   Lab Results  Component Value Date   WBC 7.6 02/26/2020   HGB 12.7 02/26/2020   HCT 39.2 02/26/2020   MCV 94.2 02/26/2020   PLT 269.0 02/26/2020   Lab Results  Component Value Date   NA 139 02/26/2020   K 4.6 02/26/2020   CHLORIDE 102 04/20/2015   CO2 31 02/26/2020   GLUCOSE 94 02/26/2020   BUN 13 02/26/2020   CREATININE 1.03 02/26/2020   BILITOT 0.4 02/26/2020   ALKPHOS 80 02/26/2020   AST 19 02/26/2020   ALT 11 02/26/2020   PROT 6.6 02/26/2020   ALBUMIN 3.9 02/26/2020   CALCIUM 9.2 02/26/2020   ANIONGAP 12 (H) 04/20/2015   EGFR 49 (L) 04/20/2015   GFR 52.29 (L) 02/26/2020   Lab Results  Component Value Date   CHOL 129 02/26/2020   Lab Results  Component Value Date   HDL 44.40 02/26/2020   Lab Results  Component Value Date   LDLCALC 60 02/26/2020   Lab Results  Component Value Date   TRIG 124.0 02/26/2020   Lab Results  Component Value Date   CHOLHDL 3 02/26/2020   Lab Results  Component Value Date   HGBA1C 5.8 03/01/2015       Assessment & Plan:   Problem List Items Addressed This Visit    Essential hypertension    Well controlled, no changes to meds. Encouraged heart healthy diet such as the DASH diet and exercise as tolerated.       Relevant Orders   Comprehensive metabolic panel (Completed)   CBC with Differential/Platelet (Completed)   TSH (Completed)  Fibromyalgia    She continues to follow with pain management but is no longer responding to Tramadol.       Leg pain    She has a firm nodule at the lateral aspect of her left knee just distal to her lateral meniscus. She has another nodule further down the lateral aspect of her left leg as well. And some  similar nodules on her medial bilateral elbows. Labs ordered and xrays of left knee and left lower leg are ordered. May proceed with CT scan if nodules persist      Osteoporosis   ANA positive    Continues to be positive and she is struggling with increased arthralgias and myalgias. She is offered a new referral to rheumatology for further evaluation.       Arthralgia - Primary   Relevant Orders   Sedimentation rate (Completed)   Antinuclear Antib (ANA) (Completed)   Rheumatoid Factor (Completed)   C-reactive protein (Completed)   Anti-nuclear ab-titer (ANA titer) (Completed)   Abdominal pain    LLQ and intermittent at times. No bloody or tarry stool. Try Hyoscyamine prn. Maintain high fiber diet and adequate fluid intake.       Relevant Orders   US Abdomen Complete   Urinalysis (Completed)   Urine Culture (Completed)   Renal insufficiency   Relevant Orders   Comprehensive metabolic panel (Completed)   Hyperlipidemia    Encouraged heart healthy diet, increase exercise, avoid trans fats, consider a krill oil cap daily. Check labs today      Relevant Orders   Lipid panel (Completed)   DG Tibia/Fibula Left   DG Knee Complete 4 Views Left   Statin intolerance    She was unable to tolerate Atorvastatin and Rosuvastatin       Other Visit Diagnoses    Myalgia       Relevant Orders   Sedimentation rate (Completed)   Antinuclear Antib (ANA) (Completed)   Rheumatoid Factor (Completed)   C-reactive protein (Completed)   Anti-nuclear ab-titer (ANA titer) (Completed)      I have discontinued Vaughan Basta Munguia's traMADol and doxycycline. I am also having her start on hyoscyamine. Additionally, I am having her maintain her acetaminophen, aspirin EC, albuterol, loratadine, pentosan polysulfate, denosumab, linaclotide, mupirocin ointment, nitroGLYCERIN, furosemide, fluticasone, traZODone, atenolol, Repatha SureClick, amLODipine, rOPINIRole, pramipexole, Estradiol, losartan, gabapentin,  omeprazole, and DULoxetine.  Meds ordered this encounter  Medications  . hyoscyamine (LEVSIN SL) 0.125 MG SL tablet    Sig: Place 1 tablet (0.125 mg total) under the tongue every 4 (four) hours as needed.    Dispense:  30 tablet    Refill:  0     [name], MD

## 2020-02-29 NOTE — Assessment & Plan Note (Signed)
She was unable to tolerate Atorvastatin and Rosuvastatin

## 2020-02-29 NOTE — Assessment & Plan Note (Signed)
She continues to follow with pain management but is no longer responding to Tramadol.

## 2020-02-29 NOTE — Assessment & Plan Note (Signed)
She has a firm nodule at the lateral aspect of her left knee just distal to her lateral meniscus. She has another nodule further down the lateral aspect of her left leg as well. And some similar nodules on her medial bilateral elbows. Labs ordered and xrays of left knee and left lower leg are ordered. May proceed with CT scan if nodules persist

## 2020-02-29 NOTE — Assessment & Plan Note (Signed)
Encouraged heart healthy diet, increase exercise, avoid trans fats, consider a krill oil cap daily Check labs today 

## 2020-02-29 NOTE — Assessment & Plan Note (Signed)
Continues to be positive and she is struggling with increased arthralgias and myalgias. She is offered a new referral to rheumatology for further evaluation.

## 2020-03-01 ENCOUNTER — Other Ambulatory Visit: Payer: Self-pay | Admitting: Family Medicine

## 2020-03-01 ENCOUNTER — Telehealth: Payer: Self-pay | Admitting: Cardiology

## 2020-03-01 DIAGNOSIS — R768 Other specified abnormal immunological findings in serum: Secondary | ICD-10-CM

## 2020-03-01 DIAGNOSIS — M2550 Pain in unspecified joint: Secondary | ICD-10-CM

## 2020-03-01 DIAGNOSIS — M791 Myalgia, unspecified site: Secondary | ICD-10-CM

## 2020-03-01 DIAGNOSIS — R7689 Other specified abnormal immunological findings in serum: Secondary | ICD-10-CM

## 2020-03-01 NOTE — Telephone Encounter (Signed)
For your records

## 2020-03-01 NOTE — Telephone Encounter (Signed)
[name], with [employer] states she is calling to confirm that the patient has been approved to take Evolocumab (REPATHA SURECLICK) [id number] MG/ML SOAJ medication.

## 2020-03-10 ENCOUNTER — Ambulatory Visit (HOSPITAL_BASED_OUTPATIENT_CLINIC_OR_DEPARTMENT_OTHER)
Admission: RE | Admit: 2020-03-10 | Discharge: 2020-03-10 | Disposition: A | Discharge status: Home / Self Care | Payer: Medicare Other | Source: Ambulatory Visit | Attending: Family Medicine | Admitting: Family Medicine

## 2020-03-10 ENCOUNTER — Other Ambulatory Visit: Payer: Self-pay | Admitting: Family Medicine

## 2020-03-10 ENCOUNTER — Other Ambulatory Visit: Payer: Self-pay

## 2020-03-10 DIAGNOSIS — R2242 Localized swelling, mass and lump, left lower limb: Secondary | ICD-10-CM | POA: Diagnosis not present

## 2020-03-10 DIAGNOSIS — R109 Unspecified abdominal pain: Secondary | ICD-10-CM | POA: Diagnosis not present

## 2020-03-10 DIAGNOSIS — M25562 Pain in left knee: Secondary | ICD-10-CM | POA: Diagnosis not present

## 2020-03-10 DIAGNOSIS — M7989 Other specified soft tissue disorders: Secondary | ICD-10-CM

## 2020-03-10 DIAGNOSIS — R1032 Left lower quadrant pain: Secondary | ICD-10-CM | POA: Diagnosis not present

## 2020-03-10 DIAGNOSIS — E785 Hyperlipidemia, unspecified: Secondary | ICD-10-CM | POA: Insufficient documentation

## 2020-03-10 DIAGNOSIS — Z1231 Encounter for screening mammogram for malignant neoplasm of breast: Secondary | ICD-10-CM

## 2020-03-15 ENCOUNTER — Other Ambulatory Visit: Payer: Self-pay | Admitting: Family Medicine

## 2020-03-16 DIAGNOSIS — Z79891 Long term (current) use of opiate analgesic: Secondary | ICD-10-CM | POA: Diagnosis not present

## 2020-03-16 DIAGNOSIS — M5136 Other intervertebral disc degeneration, lumbar region: Secondary | ICD-10-CM | POA: Diagnosis not present

## 2020-03-16 DIAGNOSIS — M5030 Other cervical disc degeneration, unspecified cervical region: Secondary | ICD-10-CM | POA: Diagnosis not present

## 2020-03-16 DIAGNOSIS — G894 Chronic pain syndrome: Secondary | ICD-10-CM | POA: Diagnosis not present

## 2020-03-19 ENCOUNTER — Ambulatory Visit (INDEPENDENT_AMBULATORY_CARE_PROVIDER_SITE_OTHER): Payer: Medicare Other | Admitting: *Deleted

## 2020-03-19 ENCOUNTER — Other Ambulatory Visit: Payer: Self-pay

## 2020-03-19 ENCOUNTER — Encounter: Payer: Self-pay | Admitting: *Deleted

## 2020-03-19 DIAGNOSIS — Z0000 Encounter for general adult medical examination without abnormal findings: Secondary | ICD-10-CM | POA: Diagnosis not present

## 2020-03-19 NOTE — Progress Notes (Signed)
Nurse connected with patient 03/19/20 at 11:45 AM EDT by a telephone enabled telemedicine application and verified that I am speaking with the correct person using two identifiers. Patient stated full name and DOB. Patient gave permission to continue with virtual visit. Patient's location was at home and Nurse's location was at [place] office.   Subjective:   [name] is a 74 y.o. female who presents for Medicare Annual (Subsequent) preventive examination.  Review of Systems:  Home Safety/Smoke Alarms: Feels safe in home. Smoke alarms in place.  Lives w/ husband.   Female:      Mammo- scheduled 04/05/20       Dexa scan- 01/30/19       CCS- 12/24/18.      Objective:     Vitals: Unable to assess. This visit is enabled though telemedicine due to Covid 19.   Advanced Directives 03/19/2020 08/28/2019 02/06/2019 08/28/2018 02/25/2018 07/19/2017 07/16/2017  Does Patient Have a Medical Advance Directive? Yes Yes Yes Yes Yes Yes Yes  Type of Paramedic of [place];Living will [place];Living will [place];Living will [place];Living will Healthcare Power of [place];Living will [place];Living will  Does patient want to make changes to medical advance directive? No - Patient declined - No - Patient declined - No - Patient declined - -  Copy of [place] in Chart? No - copy requested - No - copy requested - Yes No - copy requested -    Tobacco Social History   Tobacco Use  Smoking Status Former Smoker  . Packs/day: 1.00  . Years: 15.00  . Pack years: 15.00  . Types: Cigarettes  . Quit date: 05/27/2005  . Years since quitting: 14.8  Smokeless Tobacco Never Used     Counseling given: Not Answered   Clinical Intake: Pain : No/denies pain    Past Medical History:  Diagnosis Date  . Allergy   . Anemia   . Anxiety    past hx   .  Arthritis   . Asthma   . Atrial flutter ([place])    past history- not current  . CAD (coronary artery disease) CARDIOLOGIST--  DR Angelena Form   mild non-obstructive cad  . Cancer ([facility])    right  . Cataract    bilaterally removed   . Chronic constipation   . Chronic kidney disease    interstitial cystitis  . COPD (chronic obstructive pulmonary disease) ([place])   . Depression    past hx   . Family history of malignant hyperthermia    father had this  . Fibromyalgia   . Frequency of urination   . GERD (gastroesophageal reflux disease)   . H/O hiatal hernia   . History of basal cell carcinoma excision    X2  . History of breast cancer ONCOLOGIST-- DR Jana Hakim---  NO RECURRANCE   DX 07/2012;  LOW GRADE DCIS  ER+PR+  ----  S/P RIGHT LUMPECTOMY WITH NEGATIVE MARGINS/   RADIATION ENDED 11/2012  . History of chronic bronchitis   . History of colonic polyps   . History of tachycardia    CONTROLLED  WITH ATENOLOL  . Hyperlipidemia   . Hypertension   . Neuromuscular disorder (HCC)    fibromyalgia  . Osteoporosis 01/2019   T score -2.2 stable/improved from prior study  . Pelvic pain   . Personal history of radiation therapy   . S/P radiation therapy 11/12/12 - 12/05/12  right Breast  . Sepsis ([place]) 2014   from UTI   . Sinus headache   . Urgency of urination    Past Surgical History:  Procedure Laterality Date  . [place] STUDY N/A 04/03/2016   Procedure: [place] STUDY;  Surgeon: Manus Gunning, MD;  Location: [facility];  Service: Gastroenterology;  Laterality: N/A;  . BREAST LUMPECTOMY Right 10-11-2012   W/ SLN BX  . CARDIAC CATHETERIZATION  09-13-2007  DR Lia Foyer   WELL-PRESERVED LVF/  DIFFUSE SCATTERED CORONARY CALCIFACATION AND ATHEROSCLEROSIS WITHOUT OBSTRUCTION  . CARDIAC CATHETERIZATION  08-04-2010  DR Angelena Form   NON-OBSTRUCTIVE CAD/  pLAD 40%/  oLAD 30%/  mLAD 30%/  pRCA 30%/  EF 60%  . CARDIOVASCULAR STRESS TEST  06-18-2012  DR McALHANY   LOW RISK NUCLEAR  STUDY/  SMALL FIXED AREA OF MODERATELY DECREASED UPTAKE IN ANTEROSEPTAL WALL WHICH MAY BE ARTIFACTUAL/  NO ISCHEMIA/  EF 68%  . COLONOSCOPY  09-29-2010  . CYSTOSCOPY    . CYSTOSCOPY WITH HYDRODISTENSION AND BIOPSY N/A 03/06/2014   Procedure: CYSTOSCOPY/HYDRODISTENSION/ INSTILATION OF MARCAINE AND PYRIDIUM;  Surgeon: Ailene Rud, MD;  Location: [facility];  Service: Urology;  Laterality: N/A;  . ESOPHAGEAL MANOMETRY N/A 04/03/2016   Procedure: ESOPHAGEAL MANOMETRY (EM);  Surgeon: Manus Gunning, MD;  Location: [facility];  Service: Gastroenterology;  Laterality: N/A;  . EXTRACORPOREAL SHOCK WAVE LITHOTRIPSY Left 02/06/2019   Procedure: EXTRACORPOREAL SHOCK WAVE LITHOTRIPSY (ESWL);  Surgeon: Kathie Rhodes, MD;  Location: [facility];  Service: Urology;  Laterality: Left;  . NASAL SINUS SURGERY  1985  . ORIF RIGHT ANKLE  FX  2006  . POLYPECTOMY    . REMOVAL VOCAL CORD CYST  FEB 2014  . RIGHT BREAST BX  08-23-2012  . RIGHT HAND SURGERY  X3  LAST ONE 2009   INCLUDES  ORIF RIGHT 5TH FINGER AND REVISION TWICE  . SKIN CANCER EXCISION    . TONSILLECTOMY AND ADENOIDECTOMY  AGE 68  . TOTAL ABDOMINAL HYSTERECTOMY W/ BILATERAL SALPINGOOPHORECTOMY  1982   W/  APPENDECTOMY  . TRANSTHORACIC ECHOCARDIOGRAM  06-24-2012   GRADE I DIASTOLIC DYSFUNCTION/  EF 55-60%/  MILD MR  . UPPER GASTROINTESTINAL ENDOSCOPY     Family History  Problem Relation Age of Onset  . Rectal cancer Mother   . Colon cancer Mother   . Pancreatic cancer Mother   . Diabetes Mother   . Breast cancer Mother 68  . Breast cancer Maternal Aunt        breast  . Irritable bowel syndrome Son   . Heart disease Son        CAD, MV replacement  . Allergic Disorder Daughter   . Colon cancer Father   . Colon polyps Father   . Diabetes Father   . Stroke Father   . Heart disease Father        CHF  . Hyperlipidemia Father   . Hypertension Father   . Arthritis Father   . Breast cancer Paternal Aunt   . Breast  cancer Paternal Aunt   . Arthritis Paternal Uncle   . Allergic Disorder Daughter   . Esophageal cancer Neg Hx   . Stomach cancer Neg Hx    Social History   Socioeconomic History  . Marital status: Married    Spouse name: Jori Moll   . Number of children: 3  . Years of education: BA  . Highest education level: Not on file  Occupational History  . Occupation: Retired  RN    Employer: RETIRED  Tobacco Use  . Smoking status: Former Smoker    Packs/day: 1.00    Years: 15.00    Pack years: 15.00    Types: Cigarettes    Quit date: 05/27/2005    Years since quitting: 14.8  . Smokeless tobacco: Never Used  Substance and Sexual Activity  . Alcohol use: No    Alcohol/week: 0.0 standard drinks  . Drug use: No  . Sexual activity: Yes    Partners: Male    Comment: 1st intercourse 74 yo-Fewer than 5 partners  Other Topics Concern  . Not on file  Social History Narrative   Lives with husband.   Caffeine use: 1/2 cup per day   Exercise-- 2days a week [employer],  Water aerobics, walking       Retired Marine scientist   No dietary restrictions, tries to maintain a heart healthy diet   Social Determinants of Radio broadcast assistant Strain: Low Risk   . Difficulty of Paying Living Expenses: Not hard at all  Food Insecurity: No Food Insecurity  . Worried About Charity fundraiser in the Last Year: Never true  . Ran Out of Food in the Last Year: Never true  Transportation Needs: No Transportation Needs  . Lack of Transportation (Medical): No  . Lack of Transportation (Non-Medical): No  Physical Activity:   . Days of Exercise per Week:   . Minutes of Exercise per Session:   Stress:   . Feeling of Stress :   Social Connections:   . Frequency of Communication with Friends and Family:   . Frequency of Social Gatherings with Friends and Family:   . Attends Religious Services:   . Active Member of Clubs or Organizations:   . Attends Archivist Meetings:   Marland Kitchen Marital Status:      Outpatient Encounter Medications as of 03/19/2020  Medication Sig  . acetaminophen (TYLENOL) 500 MG tablet Take 1,000 mg by mouth every 6 (six) hours as needed for mild pain or headache.   . albuterol (PROVENTIL HFA;VENTOLIN HFA) 108 (90 BASE) MCG/ACT inhaler Inhale 2 puffs into the lungs every 4 (four) hours as needed.  Marland Kitchen amLODipine (NORVASC) 5 MG tablet amlodipine 5 mg tablet  . aspirin EC 81 MG tablet Take 1 tablet (81 mg total) by mouth daily.  Marland Kitchen atenolol (TENORMIN) 25 MG tablet TAKE 1 TABLET BY MOUTH THREE TIMES DAILY BEFORE MEAL(S)  . denosumab (PROLIA) 60 MG/ML SOSY injection Inject 60 mg into the skin every 6 (six) months.  . DULoxetine (CYMBALTA) 60 MG capsule Take 2 capsules by mouth once daily  . Estradiol 10 MCG TABS vaginal tablet Place 1 tablet (10 mcg total) vaginally 2 (two) times a week.  . Evolocumab (REPATHA SURECLICK) XX123456 MG/ML SOAJ Inject 140 mg into the skin every 14 (fourteen) days.  . fluticasone (FLONASE) 50 MCG/ACT nasal spray Place 2 sprays into both nostrils daily.  . furosemide (LASIX) 20 MG tablet Take 1 tablet (20 mg total) by mouth as needed for edema.  . gabapentin (NEURONTIN) 300 MG capsule TAKE 1 CAPSULE (300 MG TOTAL) BY MOUTH TWICE DAILY  . hyoscyamine (LEVSIN SL) 0.125 MG SL tablet Place 1 tablet (0.125 mg total) under the tongue every 4 (four) hours as needed.  . linaclotide (LINZESS) 145 MCG CAPS capsule Take 1 capsule (145 mcg total) by mouth daily before breakfast.  . loratadine (CLARITIN) 10 MG tablet Take 1 tablet (10 mg total) by mouth daily.  Marland Kitchen  losartan (COZAAR) 100 MG tablet Take 1 tablet by mouth once daily  . mupirocin ointment (BACTROBAN) 2 % Apply thin film twice daily.  Marland Kitchen omeprazole (PRILOSEC) 40 MG capsule Take 1 capsule by mouth twice daily  . pentosan polysulfate (ELMIRON) 100 MG capsule Take 1 capsule (100 mg total) by mouth 3 (three) times daily. Reported on 01/05/2016 (Patient taking differently: Take 100 mg by mouth 3 (three) times  daily with meals as needed. Reported on 01/05/2016)  . pramipexole (MIRAPEX) 0.125 MG tablet Take 1-2 tablets (0.125-0.25 mg total) by mouth 3 (three) times daily.  Marland Kitchen rOPINIRole (REQUIP) 0.25 MG tablet Take 1 tablet (0.25 mg total) by mouth 3 (three) times daily.  . traZODone (DESYREL) 100 MG tablet TAKE 1 TABLET BY MOUTH AT BEDTIME  . nitroGLYCERIN (NITROSTAT) 0.4 MG SL tablet Place 1 tablet (0.4 mg total) under the tongue every 5 (five) minutes as needed for chest pain. (Patient not taking: Reported on 03/19/2020)   Facility-Administered Encounter Medications as of 03/19/2020  Medication  . bupivacaine (MARCAINE) 0.5 % 10 mL, triamcinolone acetonide (KENALOG-40) 40 mg injection  . bupivacaine (MARCAINE) 0.5 % 15 mL, phenazopyridine (PYRIDIUM) 400 mg bladder mixture    Activities of Daily Living In your present state of health, do you have any difficulty performing the following activities: 03/19/2020 02/26/2020  Hearing? N N  Vision? N N  Difficulty concentrating or making decisions? N N  Walking or climbing stairs? N N  Dressing or bathing? N N  Doing errands, shopping? N N  Preparing Food and eating ? N -  Using the Toilet? N -  In the past six months, have you accidently leaked urine? N -  Do you have problems with loss of bowel control? N -  Managing your Medications? N -  Managing your Finances? N -  Housekeeping or managing your Housekeeping? N -  Some recent data might be hidden    Patient Care Team: Mosie Lukes, MD as PCP - General (Family Medicine) Druscilla Brownie, MD as Consulting Physician (Dermatology) Lamonte Sakai Rose Fillers, MD as Consulting Physician (Pulmonary Disease) Magrinat, Virgie Dad, MD as Consulting Physician (Oncology) Stanford Breed, Denice Bors, MD as Consulting Physician (Cardiology) Armbruster, Carlota Raspberry, MD as Consulting Physician (Gastroenterology) Suella Broad, MD as Consulting Physician (Physical Medicine and Rehabilitation) Fontaine, Belinda Block, MD as Consulting  Physician (Gynecology) Jerrell Belfast, MD as Consulting Physician (Otolaryngology) Paralee Cancel, MD as Consulting Physician (Orthopedic Surgery) Kathie Rhodes, MD as Consulting Physician (Urology)    Assessment:   This is a routine wellness examination for [place]. Physical assessment deferred to PCP.  Exercise Activities and Dietary recommendations Current Exercise Habits: Home exercise routine, Type of exercise: yoga;stretching, Time (Minutes): 20, Frequency (Times/Week): 3, Weekly Exercise (Minutes/Week): 60, Intensity: Mild, Exercise limited by: None identified Diet (meal preparation, eat out, water intake, caffeinated beverages, dairy products, fruits and vegetables): in general, a "healthy" diet  , well balanced     Goals    . DIET - EAT MORE FRUITS AND VEGETABLES    . Increase physical activity       Fall Risk Fall Risk  03/19/2020 09/25/2019 07/16/2014 07/22/2012  Falls in the past year? 0 1 No -  Number falls in past yr: 0 0 - -  Injury with Fall? 0 0 - -  Risk for fall due to : - - - Impaired balance/gait  Follow up Education provided;Falls prevention discussed - - -   Depression Screen PHQ 2/9 Scores 07/16/2014 12/29/2013  PHQ -  2 Score 0 6  PHQ- 9 Score - 14     Cognitive Function Ad8 score reviewed for issues:  Issues making decisions:no  Less interest in hobbies / activities:no  Repeats questions, stories (family complaining):no  Trouble using ordinary gadgets (microwave, computer, phone):no  Forgets the month or year: no  Mismanaging finances: no  Remembering appts:no Daily problems with thinking and/or memory:no Ad8 score is=0     MMSE - Mini Mental State Exam 07/26/2016  Orientation to time 4  Orientation to Place 4  Registration 3  Attention/ Calculation 5  Recall 2  Language- name 2 objects 2  Language- repeat 1  Language- follow 3 step command 2  Language- read & follow direction 1  Write a sentence 1  Copy design 0  Total score 25         Immunization History  Administered Date(s) Administered  . Influenza Split 10/10/2011, 09/24/2012  . Influenza Whole 10/18/2010  . Influenza,inj,Quad PF,6+ Mos 10/13/2013, 09/25/2014  . PFIZER SARS-COV-2 Vaccination 01/29/2020, 01/29/2020, 02/23/2020, 02/23/2020  . Pneumococcal Polysaccharide-23 01/27/2011  . Tdap 10/21/2013, 06/01/2018    Screening Tests Health Maintenance  Topic Date Due  . FOOT EXAM  Never done  . OPHTHALMOLOGY EXAM  Never done  . PNA vac Low Risk Adult (2 of 2 - PCV13) 01/28/2012  . HEMOGLOBIN A1C  09/01/2015  . INFLUENZA VACCINE  07/26/2019  . PAP SMEAR-Modifier  11/05/2020  . MAMMOGRAM  12/30/2020  . COLONOSCOPY  12/25/2023  . TETANUS/TDAP  06/01/2028  . DEXA SCAN  Completed  . Hepatitis C Screening  Completed      Plan:   See you next year!  Continue to eat heart healthy diet (full of fruits, vegetables, whole grains, lean protein, water--limit salt, fat, and sugar intake) and increase physical activity as tolerated.  Continue doing brain stimulating activities (puzzles, reading, adult coloring books, staying active) to keep memory sharp.    I have personally reviewed and noted the following in the patient's chart:   . Medical and social history . Use of alcohol, tobacco or illicit drugs  . Current medications and supplements . Functional ability and status . Nutritional status . Physical activity . Advanced directives . List of other physicians . Hospitalizations, surgeries, and ER visits in previous 12 months . Vitals . Screenings to include cognitive, depression, and falls . Referrals and appointments  In addition, I have reviewed and discussed with patient certain preventive protocols, quality metrics, and best practice recommendations. A written personalized care plan for preventive services as well as general preventive health recommendations were provided to patient.     Naaman Plummer [place], [place]  03/19/2020

## 2020-03-19 NOTE — Patient Instructions (Signed)
See you next year!  Continue to eat heart healthy diet (full of fruits, vegetables, whole grains, lean protein, water--limit salt, fat, and sugar intake) and increase physical activity as tolerated.  Continue doing brain stimulating activities (puzzles, reading, adult coloring books, staying active) to keep memory sharp.    [name] , Thank you for taking time to come for your Medicare Wellness Visit. I appreciate your ongoing commitment to your health goals. Please review the following plan we discussed and let me know if I can assist you in the future.   These are the goals we discussed: Goals    . DIET - EAT MORE FRUITS AND VEGETABLES    . Increase physical activity       This is a list of the screening recommended for you and due dates:  Health Maintenance  Topic Date Due  . Complete foot exam   Never done  . Eye exam for diabetics  Never done  . Pneumonia vaccines (2 of 2 - PCV13) 01/28/2012  . Hemoglobin A1C  09/01/2015  . Flu Shot  07/26/2019  . Pap Smear  11/05/2020  . Mammogram  12/30/2020  . Colon Cancer Screening  12/25/2023  . Tetanus Vaccine  06/01/2028  . DEXA scan (bone density measurement)  Completed  .  Hepatitis C: One time screening is recommended by Center for Disease Control  (CDC) for  adults born from [birth year] through 1966.   Completed    Preventive Care 30 Years and Older, Female Preventive care refers to lifestyle choices and visits with your health care provider that can promote health and wellness. This includes:  A yearly physical exam. This is also called an annual well check.  Regular dental and eye exams.  Immunizations.  Screening for certain conditions.  Healthy lifestyle choices, such as diet and exercise. What can I expect for my preventive care visit? Physical exam Your health care provider will check:  Height and weight. These may be used to calculate body mass index (BMI), which is a measurement that tells if you are at a healthy  weight.  Heart rate and blood pressure.  Your skin for abnormal spots. Counseling Your health care provider may ask you questions about:  Alcohol, tobacco, and drug use.  Emotional well-being.  Home and relationship well-being.  Sexual activity.  Eating habits.  History of falls.  Memory and ability to understand (cognition).  Work and work Statistician.  Pregnancy and menstrual history. What immunizations do I need?  Influenza (flu) vaccine  This is recommended every year. Tetanus, diphtheria, and pertussis (Tdap) vaccine  You may need a Td booster every 10 years. Varicella (chickenpox) vaccine  You may need this vaccine if you have not already been vaccinated. Zoster (shingles) vaccine  You may need this after age 26. Pneumococcal conjugate (PCV13) vaccine  One dose is recommended after age 62. Pneumococcal polysaccharide (PPSV23) vaccine  One dose is recommended after age 65. Measles, mumps, and rubella (MMR) vaccine  You may need at least one dose of MMR if you were born in 1957 or later. You may also need a second dose. Meningococcal conjugate (MenACWY) vaccine  You may need this if you have certain conditions. Hepatitis A vaccine  You may need this if you have certain conditions or if you travel or work in places where you may be exposed to hepatitis A. Hepatitis B vaccine  You may need this if you have certain conditions or if you travel or work in places where  you may be exposed to hepatitis B. Haemophilus influenzae type b (Hib) vaccine  You may need this if you have certain conditions. You may receive vaccines as individual doses or as more than one vaccine together in one shot (combination vaccines). Talk with your health care provider about the risks and benefits of combination vaccines. What tests do I need? Blood tests  Lipid and cholesterol levels. These may be checked every 5 years, or more frequently depending on your overall  health.  Hepatitis C test.  Hepatitis B test. Screening  Lung cancer screening. You may have this screening every year starting at age 59 if you have a 30-pack-year history of smoking and currently smoke or have quit within the past 15 years.  Colorectal cancer screening. All adults should have this screening starting at age 88 and continuing until age 45. Your health care provider may recommend screening at age 54 if you are at increased risk. You will have tests every 1-10 years, depending on your results and the type of screening test.  Diabetes screening. This is done by checking your blood sugar (glucose) after you have not eaten for a while (fasting). You may have this done every 1-3 years.  Mammogram. This may be done every 1-2 years. Talk with your health care provider about how often you should have regular mammograms.  BRCA-related cancer screening. This may be done if you have a family history of breast, ovarian, tubal, or peritoneal cancers. Other tests  Sexually transmitted disease (STD) testing.  Bone density scan. This is done to screen for osteoporosis. You may have this done starting at age 83. Follow these instructions at home: Eating and drinking  Eat a diet that includes fresh fruits and vegetables, whole grains, lean protein, and low-fat dairy products. Limit your intake of foods with high amounts of sugar, saturated fats, and salt.  Take vitamin and mineral supplements as recommended by your health care provider.  Do not drink alcohol if your health care provider tells you not to drink.  If you drink alcohol: ? Limit how much you have to 0-1 drink a day. ? Be aware of how much alcohol is in your drink. In the [place] one drink equals one 12 oz bottle of beer (355 mL), one 5 oz glass of wine (148 mL), or one 1 oz glass of hard liquor (44 mL). Lifestyle  Take daily care of your teeth and gums.  Stay active. Exercise for at least 30 minutes on 5 or more days  each week.  Do not use any products that contain nicotine or tobacco, such as cigarettes, e-cigarettes, and chewing tobacco. If you need help quitting, ask your health care provider.  If you are sexually active, practice safe sex. Use a condom or other form of protection in order to prevent STIs (sexually transmitted infections).  Talk with your health care provider about taking a low-dose aspirin or statin. What's next?  Go to your health care provider once a year for a well check visit.  Ask your health care provider how often you should have your eyes and teeth checked.  Stay up to date on all vaccines. This information is not intended to replace advice given to you by your health care provider. Make sure you discuss any questions you have with your health care provider. Document Revised: 12/05/2018 Document Reviewed: 12/05/2018 Elsevier Patient Education  2020 [employer].

## 2020-03-22 ENCOUNTER — Encounter (HOSPITAL_BASED_OUTPATIENT_CLINIC_OR_DEPARTMENT_OTHER): Payer: Self-pay

## 2020-03-22 ENCOUNTER — Other Ambulatory Visit: Payer: Self-pay

## 2020-03-22 ENCOUNTER — Ambulatory Visit (HOSPITAL_BASED_OUTPATIENT_CLINIC_OR_DEPARTMENT_OTHER)
Admission: RE | Admit: 2020-03-22 | Discharge: 2020-03-22 | Disposition: A | Discharge status: Home / Self Care | Payer: Medicare Other | Source: Ambulatory Visit | Attending: Family Medicine | Admitting: Family Medicine

## 2020-03-22 DIAGNOSIS — R2242 Localized swelling, mass and lump, left lower limb: Secondary | ICD-10-CM | POA: Diagnosis not present

## 2020-03-22 DIAGNOSIS — M7989 Other specified soft tissue disorders: Secondary | ICD-10-CM | POA: Diagnosis not present

## 2020-03-22 MED ORDER — IOHEXOL 300 MG/ML  SOLN
100.0000 mL | Freq: Once | INTRAMUSCULAR | Status: AC | PRN
  Administered 2020-03-22: 100 mL via INTRAVENOUS

## 2020-03-24 DIAGNOSIS — R8279 Other abnormal findings on microbiological examination of urine: Secondary | ICD-10-CM | POA: Diagnosis not present

## 2020-03-24 DIAGNOSIS — N200 Calculus of kidney: Secondary | ICD-10-CM | POA: Diagnosis not present

## 2020-04-05 ENCOUNTER — Other Ambulatory Visit: Payer: Self-pay

## 2020-04-05 ENCOUNTER — Other Ambulatory Visit: Payer: Self-pay | Admitting: Family Medicine

## 2020-04-05 ENCOUNTER — Ambulatory Visit
Admission: RE | Admit: 2020-04-05 | Discharge: 2020-04-05 | Disposition: A | Discharge status: Home / Self Care | Payer: Medicare Other | Source: Ambulatory Visit | Attending: Family Medicine | Admitting: Family Medicine

## 2020-04-05 DIAGNOSIS — N63 Unspecified lump in unspecified breast: Secondary | ICD-10-CM

## 2020-04-05 DIAGNOSIS — Z1231 Encounter for screening mammogram for malignant neoplasm of breast: Secondary | ICD-10-CM

## 2020-04-10 ENCOUNTER — Other Ambulatory Visit: Payer: Self-pay | Admitting: Cardiology

## 2020-04-20 ENCOUNTER — Ambulatory Visit
Admission: RE | Admit: 2020-04-20 | Discharge: 2020-04-20 | Disposition: A | Discharge status: Home / Self Care | Payer: Medicare Other | Source: Ambulatory Visit | Attending: Family Medicine | Admitting: Family Medicine

## 2020-04-20 ENCOUNTER — Other Ambulatory Visit: Payer: Self-pay

## 2020-04-20 DIAGNOSIS — R922 Inconclusive mammogram: Secondary | ICD-10-CM | POA: Diagnosis not present

## 2020-04-20 DIAGNOSIS — N6310 Unspecified lump in the right breast, unspecified quadrant: Secondary | ICD-10-CM | POA: Diagnosis not present

## 2020-04-20 DIAGNOSIS — Z853 Personal history of malignant neoplasm of breast: Secondary | ICD-10-CM | POA: Diagnosis not present

## 2020-04-20 DIAGNOSIS — N63 Unspecified lump in unspecified breast: Secondary | ICD-10-CM

## 2020-04-29 ENCOUNTER — Other Ambulatory Visit: Payer: Self-pay

## 2020-04-29 ENCOUNTER — Ambulatory Visit (INDEPENDENT_AMBULATORY_CARE_PROVIDER_SITE_OTHER): Payer: Medicare Other | Admitting: Family Medicine

## 2020-04-29 VITALS — BP 132/72 | HR 74 | Temp 98.7°F | Resp 12 | Ht 59.0 in | Wt 133.2 lb

## 2020-04-29 DIAGNOSIS — W19XXXD Unspecified fall, subsequent encounter: Secondary | ICD-10-CM

## 2020-04-29 DIAGNOSIS — R296 Repeated falls: Secondary | ICD-10-CM | POA: Diagnosis not present

## 2020-04-29 DIAGNOSIS — E785 Hyperlipidemia, unspecified: Secondary | ICD-10-CM | POA: Diagnosis not present

## 2020-04-29 DIAGNOSIS — K5904 Chronic idiopathic constipation: Secondary | ICD-10-CM

## 2020-04-29 DIAGNOSIS — R519 Headache, unspecified: Secondary | ICD-10-CM

## 2020-04-29 DIAGNOSIS — N289 Disorder of kidney and ureter, unspecified: Secondary | ICD-10-CM

## 2020-04-29 DIAGNOSIS — M797 Fibromyalgia: Secondary | ICD-10-CM

## 2020-04-29 MED ORDER — ATENOLOL 50 MG PO TABS: 50.0000 mg | ORAL_TABLET | Freq: Two times a day (BID) | ORAL | 1 refills | Status: DC

## 2020-04-29 MED ORDER — DOXYCYCLINE HYCLATE 100 MG PO TABS: 100.0000 mg | ORAL_TABLET | Freq: Two times a day (BID) | ORAL | 0 refills | Status: DC

## 2020-04-29 NOTE — Patient Instructions (Addendum)
Probiotic with lactobacillus daily, [place] or [email or web address]  Benefiber powder in beverage once to twice daily  Can add miralax once or twice daily if constipation returns and can mix with Benefiber   Try topical pain meds such as aspercreme. Lidocaine gel to sternum   Costochondritis  Costochondritis is swelling and irritation (inflammation) of the tissue (cartilage) that connects your ribs to your breastbone (sternum). This causes pain in the front of your chest. The pain usually starts gradually and involves more than one rib. What are the causes? The exact cause of this condition is not always known. It results from stress on the cartilage where your ribs attach to your sternum. The cause of this stress could be:  Chest injury (trauma).  Exercise or activity, such as lifting.  Severe coughing. What increases the risk? You may be at higher risk for this condition if you:  Are female.  Are 26?75 years old.  Recently started a new exercise or work activity.  Have low levels of vitamin D.  Have a condition that makes you cough frequently. What are the signs or symptoms? The main symptom of this condition is chest pain. The pain:  Usually starts gradually and can be sharp or dull.  Gets worse with deep breathing, coughing, or exercise.  Gets better with rest.  May be worse when you press on the sternum-rib connection (tenderness). How is this diagnosed? This condition is diagnosed based on your symptoms, medical history, and a physical exam. Your health care provider will check for tenderness when pressing on your sternum. This is the most important finding. You may also have tests to rule out other causes of chest pain. These may include:  A chest X-ray to check for lung problems.  An electrocardiogram (ECG) to see if you have a heart problem that could be causing the pain.  An imaging scan to rule out a chest or rib fracture. How is this  treated? This condition usually goes away on its own over time. Your health care provider may prescribe an NSAID to reduce pain and inflammation. Your health care provider may also suggest that you:  Rest and avoid activities that make pain worse.  Apply heat or cold to the area to reduce pain and inflammation.  Do exercises to stretch your chest muscles. If these treatments do not help, your health care provider may inject a numbing medicine at the sternum-rib connection to help relieve the pain. Follow these instructions at home:  Avoid activities that make pain worse. This includes any activities that use chest, abdominal, and side muscles.  If directed, put ice on the painful area: ? Put ice in a plastic bag. ? Place a towel between your skin and the bag. ? Leave the ice on for 20 minutes, 2-3 times a day.  If directed, apply heat to the affected area as often as told by your health care provider. Use the heat source that your health care provider recommends, such as a moist heat pack or a heating pad. ? Place a towel between your skin and the heat source. ? Leave the heat on for 20-30 minutes. ? Remove the heat if your skin turns bright red. This is especially important if you are unable to feel pain, heat, or cold. You may have a greater risk of getting burned.  Take over-the-counter and prescription medicines only as told by your health care provider.  Return to your normal activities as told by your health care  provider. Ask your health care provider what activities are safe for you.  Keep all follow-up visits as told by your health care provider. This is important. Contact a health care provider if:  You have chills or a fever.  Your pain does not go away or it gets worse.  You have a cough that does not go away (is persistent). Get help right away if:  You have shortness of breath. This information is not intended to replace advice given to you by your health care  provider. Make sure you discuss any questions you have with your health care provider. Document Revised: 12/26/2017 Document Reviewed: 04/05/2016 Elsevier Patient Education  2020 [employer].

## 2020-04-30 ENCOUNTER — Encounter: Payer: Self-pay | Admitting: Family Medicine

## 2020-05-02 DIAGNOSIS — R296 Repeated falls: Secondary | ICD-10-CM | POA: Insufficient documentation

## 2020-05-02 HISTORY — DX: Repeated falls: R29.6

## 2020-05-02 NOTE — Assessment & Plan Note (Signed)
She is much better off of her pain meds and moves her bowels comfortably without any further trouble with her fistula. Encouraged increased hydration and fiber in diet. Daily probiotics.

## 2020-05-02 NOTE — Progress Notes (Signed)
Subjective:    Patient ID: [name], female    DOB: 1946/03/29, 74 y.o.   MRN: [id number]  Chief Complaint  Patient presents with  . 2 months follow up    HPI Patient is in today for follow up on chronic medical concerns. No recent febrile illness or hospitalizations. She has had congestion, sinus pressure, has popping/gurgling in ears. Some disequilibrium at times. She did have 2 falls since her last visit. No syncope. She has stopped her pain meds and her bowels are moving better and her thinking is clearer. She is staying active and eating well. Denies CP/palp/SOB/HA/congestion/fevers/GI or GU c/o. Taking meds as prescribed  Past Medical History:  Diagnosis Date  . Allergy   . Anemia   . Anxiety    past hx   . Arthritis   . Asthma   . Atrial flutter ([place])    past history- not current  . CAD (coronary artery disease) CARDIOLOGIST--  DR Angelena Form   mild non-obstructive cad  . Cancer ([place])    right  . Cataract    bilaterally removed   . Chronic constipation   . Chronic kidney disease    interstitial cystitis  . COPD (chronic obstructive pulmonary disease) ([place])   . Depression    past hx   . Family history of malignant hyperthermia    father had this  . Fibromyalgia   . Frequency of urination   . GERD (gastroesophageal reflux disease)   . H/O hiatal hernia   . History of basal cell carcinoma excision    X2  . History of breast cancer ONCOLOGIST-- DR Jana Hakim---  NO RECURRANCE   DX 07/2012;  LOW GRADE DCIS  ER+PR+  ----  S/P RIGHT LUMPECTOMY WITH NEGATIVE MARGINS/   RADIATION ENDED 11/2012  . History of chronic bronchitis   . History of colonic polyps   . History of tachycardia    CONTROLLED  WITH ATENOLOL  . Hyperlipidemia   . Hypertension   . Neuromuscular disorder ([facility])    fibromyalgia  . Osteoporosis 01/2019   T score -2.2 stable/improved from prior study  . Pelvic pain   . Personal history of radiation therapy   . S/P radiation therapy 11/12/12 - 12/05/12    right Breast  . Sepsis ([place]) 2014   from UTI   . Sinus headache   . Urgency of urination     Past Surgical History:  Procedure Laterality Date  . [place] STUDY N/A 04/03/2016   Procedure: [place] STUDY;  Surgeon: Manus Gunning, MD;  Location: [facility];  Service: Gastroenterology;  Laterality: N/A;  . BREAST LUMPECTOMY Right 10-11-2012   W/ SLN BX  . CARDIAC CATHETERIZATION  09-13-2007  DR Lia Foyer   WELL-PRESERVED LVF/  DIFFUSE SCATTERED CORONARY CALCIFACATION AND ATHEROSCLEROSIS WITHOUT OBSTRUCTION  . CARDIAC CATHETERIZATION  08-04-2010  DR Angelena Form   NON-OBSTRUCTIVE CAD/  pLAD 40%/  oLAD 30%/  mLAD 30%/  pRCA 30%/  EF 60%  . CARDIOVASCULAR STRESS TEST  06-18-2012  DR McALHANY   LOW RISK NUCLEAR STUDY/  SMALL FIXED AREA OF MODERATELY DECREASED UPTAKE IN ANTEROSEPTAL WALL WHICH MAY BE ARTIFACTUAL/  NO ISCHEMIA/  EF 68%  . COLONOSCOPY  09-29-2010  . CYSTOSCOPY    . CYSTOSCOPY WITH HYDRODISTENSION AND BIOPSY N/A 03/06/2014   Procedure: CYSTOSCOPY/HYDRODISTENSION/ INSTILATION OF MARCAINE AND PYRIDIUM;  Surgeon: Ailene Rud, MD;  Location: [facility];  Service: Urology;  Laterality: N/A;  . ESOPHAGEAL MANOMETRY N/A 04/03/2016  Procedure: ESOPHAGEAL MANOMETRY (EM);  Surgeon: Manus Gunning, MD;  Location: [facility];  Service: Gastroenterology;  Laterality: N/A;  . EXTRACORPOREAL SHOCK WAVE LITHOTRIPSY Left 02/06/2019   Procedure: EXTRACORPOREAL SHOCK WAVE LITHOTRIPSY (ESWL);  Surgeon: Kathie Rhodes, MD;  Location: [facility];  Service: Urology;  Laterality: Left;  . NASAL SINUS SURGERY  1985  . ORIF RIGHT ANKLE  FX  2006  . POLYPECTOMY    . REMOVAL VOCAL CORD CYST  FEB 2014  . RIGHT BREAST BX  08-23-2012  . RIGHT HAND SURGERY  X3  LAST ONE 2009   INCLUDES  ORIF RIGHT 5TH FINGER AND REVISION TWICE  . SKIN CANCER EXCISION    . TONSILLECTOMY AND ADENOIDECTOMY  AGE 73  . TOTAL ABDOMINAL HYSTERECTOMY W/ BILATERAL SALPINGOOPHORECTOMY  1982    W/  APPENDECTOMY  . TRANSTHORACIC ECHOCARDIOGRAM  06-24-2012   GRADE I DIASTOLIC DYSFUNCTION/  EF 55-60%/  MILD MR  . UPPER GASTROINTESTINAL ENDOSCOPY      Family History  Problem Relation Age of Onset  . Rectal cancer Mother   . Colon cancer Mother   . Pancreatic cancer Mother   . Diabetes Mother   . Breast cancer Mother 9  . Breast cancer Maternal Aunt        breast  . Irritable bowel syndrome Son   . Heart disease Son        CAD, MV replacement  . Allergic Disorder Daughter   . Colon cancer Father   . Colon polyps Father   . Diabetes Father   . Stroke Father   . Heart disease Father        CHF  . Hyperlipidemia Father   . Hypertension Father   . Arthritis Father   . Breast cancer Paternal Aunt   . Breast cancer Paternal Aunt   . Arthritis Paternal Uncle   . Allergic Disorder Daughter   . Esophageal cancer Neg Hx   . Stomach cancer Neg Hx     Social History   Socioeconomic History  . Marital status: Married    Spouse name: Jori Moll   . Number of children: 3  . Years of education: BA  . Highest education level: Not on file  Occupational History  . Occupation: Retired Programmer, multimedia: RETIRED  Tobacco Use  . Smoking status: Former Smoker    Packs/day: 1.00    Years: 15.00    Pack years: 15.00    Types: Cigarettes    Quit date: 05/27/2005    Years since quitting: 14.9  . Smokeless tobacco: Never Used  Substance and Sexual Activity  . Alcohol use: No    Alcohol/week: 0.0 standard drinks  . Drug use: No  . Sexual activity: Yes    Partners: Male    Comment: 1st intercourse 74 yo-Fewer than 5 partners  Other Topics Concern  . Not on file  Social History Narrative   Lives with husband.   Caffeine use: 1/2 cup per day   Exercise-- 2days a week [employer],  Water aerobics, walking       Retired Marine scientist   No dietary restrictions, tries to maintain a heart healthy diet   Social Determinants of Radio broadcast assistant Strain: Low Risk   . Difficulty of Paying  Living Expenses: Not hard at all  Food Insecurity: No Food Insecurity  . Worried About Charity fundraiser in the Last Year: Never true  . Ran Out of Food in the Last Year: Never true  Transportation Needs: No Transportation Needs  . Lack of Transportation (Medical): No  . Lack of Transportation (Non-Medical): No  Physical Activity:   . Days of Exercise per Week:   . Minutes of Exercise per Session:   Stress:   . Feeling of Stress :   Social Connections:   . Frequency of Communication with Friends and Family:   . Frequency of Social Gatherings with Friends and Family:   . Attends Religious Services:   . Active Member of Clubs or Organizations:   . Attends Archivist Meetings:   Marland Kitchen Marital Status:   Intimate Partner Violence:   . Fear of Current or Ex-Partner:   . Emotionally Abused:   Marland Kitchen Physically Abused:   . Sexually Abused:     Outpatient Medications Prior to Visit  Medication Sig Dispense Refill  . acetaminophen (TYLENOL) 500 MG tablet Take 1,000 mg by mouth every 6 (six) hours as needed for mild pain or headache.     . albuterol (PROVENTIL HFA;VENTOLIN HFA) 108 (90 BASE) MCG/ACT inhaler Inhale 2 puffs into the lungs every 4 (four) hours as needed. 1 Inhaler 5  . amLODipine (NORVASC) 5 MG tablet amlodipine 5 mg tablet    . aspirin EC 81 MG tablet Take 1 tablet (81 mg total) by mouth daily. 90 tablet 3  . denosumab (PROLIA) 60 MG/ML SOSY injection Inject 60 mg into the skin every 6 (six) months.    . DULoxetine (CYMBALTA) 60 MG capsule Take 2 capsules by mouth once daily 90 capsule 0  . Estradiol 10 MCG TABS vaginal tablet Place 1 tablet (10 mcg total) vaginally 2 (two) times a week. 24 tablet 4  . fluticasone (FLONASE) 50 MCG/ACT nasal spray Place 2 sprays into both nostrils daily. 16 g 5  . furosemide (LASIX) 20 MG tablet Take 1 tablet (20 mg total) by mouth as needed for edema. 90 tablet 3  . gabapentin (NEURONTIN) 300 MG capsule Take 300 mg by mouth. 1cap in the  morning, 1cap at noon, and 2caps at bedtime    . hyoscyamine (LEVSIN SL) 0.125 MG SL tablet Place 1 tablet (0.125 mg total) under the tongue every 4 (four) hours as needed. 30 tablet 0  . linaclotide (LINZESS) 145 MCG CAPS capsule Take 1 capsule (145 mcg total) by mouth daily before breakfast. 30 capsule 5  . loratadine (CLARITIN) 10 MG tablet Take 1 tablet (10 mg total) by mouth daily. 30 tablet 11  . losartan (COZAAR) 100 MG tablet Take 1 tablet by mouth once daily 90 tablet 2  . mupirocin ointment (BACTROBAN) 2 % Apply thin film twice daily. 22 g 0  . nitroGLYCERIN (NITROSTAT) 0.4 MG SL tablet Place 1 tablet (0.4 mg total) under the tongue every 5 (five) minutes as needed for chest pain. 25 tablet 1  . omeprazole (PRILOSEC) 40 MG capsule Take 1 capsule by mouth twice daily 90 capsule 0  . pentosan polysulfate (ELMIRON) 100 MG capsule Take 1 capsule (100 mg total) by mouth 3 (three) times daily. Reported on 01/05/2016 (Patient taking differently: Take 100 mg by mouth 3 (three) times daily with meals as needed. Reported on 01/05/2016) 90 capsule 11  . pramipexole (MIRAPEX) 0.125 MG tablet Take 1-2 tablets (0.125-0.25 mg total) by mouth 3 (three) times daily. 60 tablet 1  . REPATHA SURECLICK 962 MG/ML SOAJ INJECT 140 MG SUBCUTANEOUSLY EVERY 14 DAYS 2 pen 11  . rOPINIRole (REQUIP) 0.25 MG tablet Take 1 tablet (0.25 mg total) by mouth 3 (three)  times daily. 30 tablet 5  . traZODone (DESYREL) 100 MG tablet TAKE 1 TABLET BY MOUTH AT BEDTIME 90 tablet 0  . atenolol (TENORMIN) 25 MG tablet TAKE 1 TABLET BY MOUTH THREE TIMES DAILY BEFORE MEAL(S) 270 tablet 2  . gabapentin (NEURONTIN) 300 MG capsule TAKE 1 CAPSULE (300 MG TOTAL) BY MOUTH TWICE DAILY 180 capsule 0   Facility-Administered Medications Prior to Visit  Medication Dose Route Frequency Provider Last Rate Last Admin  . bupivacaine (MARCAINE) 0.5 % 10 mL, triamcinolone acetonide (KENALOG-40) 40 mg injection   Subcutaneous Once Carolan Clines, MD       . bupivacaine (MARCAINE) 0.5 % 15 mL, phenazopyridine (PYRIDIUM) 400 mg bladder mixture   Bladder Instillation Once Carolan Clines, MD        Allergies  Allergen Reactions  . Clindamycin Hcl Shortness Of Breath and Rash  . Penicillins Anaphylaxis  . Prednisone Shortness Of Breath and Rash  . Rosuvastatin Anaphylaxis  . Baclofen Other (See Comments) and Rash  . Cortisone Other (See Comments)  . Lincomycin Other (See Comments)  . Codeine Hives and Other (See Comments)    headache  . Erythromycin Base Other (See Comments)    other  . Fluzone [Flu Virus Vaccine] Other (See Comments)    Local reaction at the site  . Haemophilus Influenzae Other (See Comments)    Local reaction at the site Local reaction at the site  . Latex Hives  . Pentazocine Lactate Other (See Comments)    HALLUCINATION  . Pneumococcal Vaccine Polyvalent Hives, Swelling and Other (See Comments)    REACTION: redness, swelling, and hives at injection site  . Tamoxifen Nausea And Vomiting and Other (See Comments)    HEADACHE    Review of Systems  Constitutional: Positive for malaise/fatigue. Negative for fever.  HENT: Positive for congestion, ear pain and sinus pain. Negative for ear discharge and tinnitus.   Eyes: Negative for blurred vision.  Respiratory: Negative for shortness of breath.   Cardiovascular: Negative for chest pain, palpitations and leg swelling.  Gastrointestinal: Negative for abdominal pain, blood in stool and nausea.  Genitourinary: Negative for dysuria and frequency.  Musculoskeletal: Positive for falls.  Skin: Negative for rash.  Neurological: Negative for dizziness, loss of consciousness and headaches.  Endo/Heme/Allergies: Negative for environmental allergies.  Psychiatric/Behavioral: Negative for depression. The patient is not nervous/anxious.        Objective:    Physical Exam Vitals and nursing note reviewed.  Constitutional:      General: She is not in acute  distress.    Appearance: She is well-developed.  HENT:     Head: Normocephalic and atraumatic.     Ears:     Comments: TMs dull and retracted    Nose: Nose normal.  Eyes:     General:        Right eye: No discharge.        Left eye: No discharge.  Cardiovascular:     Rate and Rhythm: Normal rate and regular rhythm.     Heart sounds: No murmur.  Pulmonary:     Effort: Pulmonary effort is normal.     Breath sounds: Normal breath sounds.  Abdominal:     General: Bowel sounds are normal.     Palpations: Abdomen is soft.     Tenderness: There is no abdominal tenderness.  Musculoskeletal:     Cervical back: Normal range of motion and neck supple.  Skin:    General: Skin is warm and dry.  Neurological:     Mental Status: She is alert and oriented to person, place, and time.     BP 132/72 (BP Location: Left Arm, Cuff Size: Normal)   Pulse 74   Temp 98.7 F (37.1 C) (Temporal)   Resp 12   Ht '4\' 11"'$  (1.499 m)   Wt 133 lb 3.2 oz (60.4 kg)   SpO2 100%   BMI 26.90 kg/m  Wt Readings from Last 3 Encounters:  04/29/20 133 lb 3.2 oz (60.4 kg)  02/26/20 136 lb (61.7 kg)  11/07/19 138 lb (62.6 kg)    Diabetic Foot Exam - Simple   No data filed     Lab Results  Component Value Date   WBC 7.6 02/26/2020   HGB 12.7 02/26/2020   HCT 39.2 02/26/2020   PLT 269.0 02/26/2020   GLUCOSE 94 02/26/2020   CHOL 129 02/26/2020   TRIG 124.0 02/26/2020   HDL 44.40 02/26/2020   LDLDIRECT 117.3 04/08/2012   LDLCALC 60 02/26/2020   ALT 11 02/26/2020   AST 19 02/26/2020   NA 139 02/26/2020   K 4.6 02/26/2020   CL 103 02/26/2020   CREATININE 1.03 02/26/2020   BUN 13 02/26/2020   CO2 31 02/26/2020   TSH 1.76 02/26/2020   INR 1.05 08/03/2010   HGBA1C 5.8 03/01/2015   MICROALBUR 0.2 07/10/2013    Lab Results  Component Value Date   TSH 1.76 02/26/2020   Lab Results  Component Value Date   WBC 7.6 02/26/2020   HGB 12.7 02/26/2020   HCT 39.2 02/26/2020   MCV 94.2 02/26/2020    PLT 269.0 02/26/2020   Lab Results  Component Value Date   NA 139 02/26/2020   K 4.6 02/26/2020   CHLORIDE 102 04/20/2015   CO2 31 02/26/2020   GLUCOSE 94 02/26/2020   BUN 13 02/26/2020   CREATININE 1.03 02/26/2020   BILITOT 0.4 02/26/2020   ALKPHOS 80 02/26/2020   AST 19 02/26/2020   ALT 11 02/26/2020   PROT 6.6 02/26/2020   ALBUMIN 3.9 02/26/2020   CALCIUM 9.2 02/26/2020   ANIONGAP 12 (H) 04/20/2015   EGFR 49 (L) 04/20/2015   GFR 52.29 (L) 02/26/2020   Lab Results  Component Value Date   CHOL 129 02/26/2020   Lab Results  Component Value Date   HDL 44.40 02/26/2020   Lab Results  Component Value Date   LDLCALC 60 02/26/2020   Lab Results  Component Value Date   TRIG 124.0 02/26/2020   Lab Results  Component Value Date   CHOLHDL 3 02/26/2020   Lab Results  Component Value Date   HGBA1C 5.8 03/01/2015       Assessment & Plan:   Problem List Items Addressed This Visit    Fibromyalgia    She has stopped most of her pain medications including Tramadol and Naltrexone and overall still have daily pain but not more pain. She has considerably less side effects so overall is happy with the change. She feels she thinks more clearly and has less constipation for starters      Relevant Medications   gabapentin (NEURONTIN) 300 MG capsule   Functional constipation    She is much better off of her pain meds and moves her bowels comfortably without any further trouble with her fistula. Encouraged increased hydration and fiber in diet. Daily probiotics.       Headache    Increased congestion. Pressure and popping in ears and disequilibrium given a course of Doxycycline to  take if symptoms worsen with mucinex and probiotics      Relevant Medications   gabapentin (NEURONTIN) 300 MG capsule   atenolol (TENORMIN) 50 MG tablet   Renal insufficiency    Hydrate and monitor      Hyperlipidemia    Encouraged heart healthy diet, increase exercise, avoid trans fats,  consider a krill oil cap daily      Relevant Medications   atenolol (TENORMIN) 50 MG tablet   Recurrent falls    With unsteady gait referred to physical therapy for further evaluation.        Other Visit Diagnoses    Fall, subsequent encounter    -  Primary   Relevant Orders   Ambulatory referral to Physical Therapy      I have discontinued Teshara Mcguinness's atenolol. I am also having her start on doxycycline and atenolol. Additionally, I am having her maintain her acetaminophen, aspirin EC, albuterol, loratadine, pentosan polysulfate, denosumab, linaclotide, mupirocin ointment, nitroGLYCERIN, furosemide, fluticasone, amLODipine, rOPINIRole, pramipexole, Estradiol, losartan, hyoscyamine, omeprazole, traZODone, DULoxetine, Repatha SureClick, and gabapentin.  Meds ordered this encounter  Medications  . doxycycline (VIBRA-TABS) 100 MG tablet    Sig: Take 1 tablet (100 mg total) by mouth 2 (two) times daily.    Dispense:  20 tablet    Refill:  0  . atenolol (TENORMIN) 50 MG tablet    Sig: Take 1 tablet (50 mg total) by mouth 2 (two) times daily.    Dispense:  180 tablet    Refill:  1     [name], MD

## 2020-05-02 NOTE — Assessment & Plan Note (Signed)
Hydrate and monitor 

## 2020-05-02 NOTE — Assessment & Plan Note (Signed)
Encouraged heart healthy diet, increase exercise, avoid trans fats, consider a krill oil cap daily 

## 2020-05-02 NOTE — Assessment & Plan Note (Signed)
Increased congestion. Pressure and popping in ears and disequilibrium given a course of Doxycycline to take if symptoms worsen with mucinex and probiotics

## 2020-05-02 NOTE — Assessment & Plan Note (Signed)
With unsteady gait referred to physical therapy for further evaluation.

## 2020-05-02 NOTE — Assessment & Plan Note (Signed)
She has stopped most of her pain medications including Tramadol and Naltrexone and overall still have daily pain but not more pain. She has considerably less side effects so overall is happy with the change. She feels she thinks more clearly and has less constipation for starters

## 2020-05-05 ENCOUNTER — Encounter: Payer: Self-pay | Admitting: Family Medicine

## 2020-05-05 NOTE — Progress Notes (Deleted)
Office Visit Note  Patient: [name]             Date of Birth: 1946/01/17           MRN: [id number]             PCP: Mosie Lukes, MD Referring: Mosie Lukes, MD Visit Date: 05/18/2020 Occupation: @GUAROCC @  Subjective:  No chief complaint on file.   History of Present Illness: [name] is a 74 y.o. female ***   Activities of Daily Living:  Patient reports morning stiffness for *** {minute/hour:19697}.   Patient {ACTIONS;DENIES/REPORTS:21021675::"Denies"} nocturnal pain.  Difficulty dressing/grooming: {ACTIONS;DENIES/REPORTS:21021675::"Denies"} Difficulty climbing stairs: {ACTIONS;DENIES/REPORTS:21021675::"Denies"} Difficulty getting out of chair: {ACTIONS;DENIES/REPORTS:21021675::"Denies"} Difficulty using hands for taps, buttons, cutlery, and/or writing: {ACTIONS;DENIES/REPORTS:21021675::"Denies"}  No Rheumatology ROS completed.   PMFS History:  Patient Active Problem List   Diagnosis Date Noted  . Recurrent falls 05/02/2020  . Statin intolerance 02/29/2020  . RLS (restless legs syndrome) 11/09/2019  . Kidney stone 02/26/2019  . Recurrent sinusitis 02/25/2019  . Hx of colonic polyp 02/25/2019  . DDD (degenerative disc disease), cervical 09/17/2018  . Degenerative scoliosis 04/22/2018  . Primary insomnia 06/25/2017  . Chronic interstitial cystitis 02/01/2017  . Cough, persistent 01/09/2017  . Right arm pain 07/20/2016  . Deviated septum 05/12/2016  . Nasal turbinate hypertrophy 05/12/2016  . Dysphagia   . Allergic rhinitis 01/25/2016  . Other and combined forms of senile cataract 07/15/2015  . Dyspnea   . Left hip pain 04/30/2015  . Sciatica 04/30/2015  . Knee pain, right 04/30/2015  . Bilateral carpal tunnel syndrome 03/04/2015  . Hyperlipidemia 03/01/2015  . Pulmonary nodules 02/12/2015  . External hemorrhoids 02/05/2015  . Chronic night sweats 01/08/2015  . Atypical chest pain 01/01/2015  . Renal insufficiency 07/16/2014  . Memory loss  05/22/2014  . Peripheral neuropathy 05/22/2014  . Abdominal pain 10/26/2013  . Headache 10/13/2013  . Arthralgia 08/18/2013  . ANA positive 07/29/2013  . Depression 02/05/2013  . Family history of malignant hyperthermia 02/05/2013  . Malignant neoplasm of lower-outer quadrant of right breast of female, estrogen receptor positive ([place]) 01/03/2013  . Splenic lesion 09/02/2012  . Asthma, allergic 08/05/2012  . GERD (gastroesophageal reflux disease) 06/03/2012  . Edema 04/08/2012  . Stricture and stenosis of esophagus 10/19/2011  . Functional constipation 07/21/2011  . Nonspecific (abnormal) findings on radiological and other examination of biliary tract 07/21/2011  . Osteoporosis 04/17/2011  . Leg pain 02/24/2011  . FIBROCYSTIC BREAST DISEASE, HX OF 10/18/2010  . Essential hypertension 08/25/2010  . CAD in native artery 08/25/2010  . TOBACCO ABUSE, HX OF 08/25/2010  . GENERALIZED ANXIETY DISORDER 08/03/2010  . Fibromyalgia 01/11/2010    Past Medical History:  Diagnosis Date  . Allergy   . Anemia   . Anxiety    past hx   . Arthritis   . Asthma   . Atrial flutter ([place])    past history- not current  . CAD (coronary artery disease) CARDIOLOGIST--  DR Angelena Form   mild non-obstructive cad  . Cancer ([place])    right  . Cataract    bilaterally removed   . Chronic constipation   . Chronic kidney disease    interstitial cystitis  . COPD (chronic obstructive pulmonary disease) ([place])   . Depression    past hx   . Family history of malignant hyperthermia    father had this  . Fibromyalgia   . Frequency of urination   . GERD (gastroesophageal reflux disease)   .  H/O hiatal hernia   . History of basal cell carcinoma excision    X2  . History of breast cancer ONCOLOGIST-- DR Jana Hakim---  NO RECURRANCE   DX 07/2012;  LOW GRADE DCIS  ER+PR+  ----  S/P RIGHT LUMPECTOMY WITH NEGATIVE MARGINS/   RADIATION ENDED 11/2012  . History of chronic bronchitis   . History of colonic polyps   .  History of tachycardia    CONTROLLED  WITH ATENOLOL  . Hyperlipidemia   . Hypertension   . Neuromuscular disorder ([facility])    fibromyalgia  . Osteoporosis 01/2019   T score -2.2 stable/improved from prior study  . Pelvic pain   . Personal history of radiation therapy   . S/P radiation therapy 11/12/12 - 12/05/12   right Breast  . Sepsis ([place]) 2014   from UTI   . Sinus headache   . Urgency of urination     Family History  Problem Relation Age of Onset  . Rectal cancer Mother   . Colon cancer Mother   . Pancreatic cancer Mother   . Diabetes Mother   . Breast cancer Mother 4  . Breast cancer Maternal Aunt        breast  . Irritable bowel syndrome Son   . Heart disease Son        CAD, MV replacement  . Allergic Disorder Daughter   . Colon cancer Father   . Colon polyps Father   . Diabetes Father   . Stroke Father   . Heart disease Father        CHF  . Hyperlipidemia Father   . Hypertension Father   . Arthritis Father   . Breast cancer Paternal Aunt   . Breast cancer Paternal Aunt   . Arthritis Paternal Uncle   . Allergic Disorder Daughter   . Esophageal cancer Neg Hx   . Stomach cancer Neg Hx    Past Surgical History:  Procedure Laterality Date  . [place] STUDY N/A 04/03/2016   Procedure: [place] STUDY;  Surgeon: Manus Gunning, MD;  Location: [facility];  Service: Gastroenterology;  Laterality: N/A;  . BREAST LUMPECTOMY Right 10-11-2012   W/ SLN BX  . CARDIAC CATHETERIZATION  09-13-2007  DR Lia Foyer   WELL-PRESERVED LVF/  DIFFUSE SCATTERED CORONARY CALCIFACATION AND ATHEROSCLEROSIS WITHOUT OBSTRUCTION  . CARDIAC CATHETERIZATION  08-04-2010  DR Angelena Form   NON-OBSTRUCTIVE CAD/  pLAD 40%/  oLAD 30%/  mLAD 30%/  pRCA 30%/  EF 60%  . CARDIOVASCULAR STRESS TEST  06-18-2012  DR McALHANY   LOW RISK NUCLEAR STUDY/  SMALL FIXED AREA OF MODERATELY DECREASED UPTAKE IN ANTEROSEPTAL WALL WHICH MAY BE ARTIFACTUAL/  NO ISCHEMIA/  EF 68%  . COLONOSCOPY  09-29-2010    . CYSTOSCOPY    . CYSTOSCOPY WITH HYDRODISTENSION AND BIOPSY N/A 03/06/2014   Procedure: CYSTOSCOPY/HYDRODISTENSION/ INSTILATION OF MARCAINE AND PYRIDIUM;  Surgeon: Ailene Rud, MD;  Location[facility];  Service: Urology;  Laterality: N/A;  . ESOPHAGEAL MANOMETRY N/A 04/03/2016   Procedure: ESOPHAGEAL MANOMETRY (EM);  Surgeon: Manus Gunning, MD;  Location: [facility];  Service: Gastroenterology;  Laterality: N/A;  . EXTRACORPOREAL SHOCK WAVE LITHOTRIPSY Left 02/06/2019   Procedure: EXTRACORPOREAL SHOCK WAVE LITHOTRIPSY (ESWL);  Surgeon: Kathie Rhodes, MD;  Location: [facility];  Service: Urology;  Laterality: Left;  . NASAL SINUS SURGERY  1985  . ORIF RIGHT ANKLE  FX  2006  . POLYPECTOMY    . REMOVAL VOCAL CORD CYST  FEB 2014  . RIGHT BREAST BX  08-23-2012  . RIGHT HAND SURGERY  X3  LAST ONE 2009   INCLUDES  ORIF RIGHT 5TH FINGER AND REVISION TWICE  . SKIN CANCER EXCISION    . TONSILLECTOMY AND ADENOIDECTOMY  AGE 14  . TOTAL ABDOMINAL HYSTERECTOMY W/ BILATERAL SALPINGOOPHORECTOMY  1982   W/  APPENDECTOMY  . TRANSTHORACIC ECHOCARDIOGRAM  06-24-2012   GRADE I DIASTOLIC DYSFUNCTION/  EF 55-60%/  MILD MR  . UPPER GASTROINTESTINAL ENDOSCOPY     Social History   Social History Narrative   Lives with husband.   Caffeine use: 1/2 cup per day   Exercise-- 2days a week [employer],  Water aerobics, walking       Retired Marine scientist   No dietary restrictions, tries to maintain a heart healthy diet   Immunization History  Administered Date(s) Administered  . Influenza Split 10/10/2011, 09/24/2012  . Influenza Whole 10/18/2010  . Influenza,inj,Quad PF,6+ Mos 10/13/2013, 09/25/2014  . PFIZER SARS-COV-2 Vaccination 01/29/2020, 02/23/2020  . Pneumococcal Polysaccharide-23 01/27/2011  . Tdap 10/21/2013, 06/01/2018     Objective: Vital Signs: There were no vitals taken for this visit.   Physical Exam   Musculoskeletal Exam: ***  CDAI Exam: CDAI Score: -- Patient  Global: --; Provider Global: -- Swollen: --; Tender: -- Joint Exam 05/18/2020   No joint exam has been documented for this visit   There is currently no information documented on the homunculus. Go to the Rheumatology activity and complete the homunculus joint exam.  Investigation: No additional findings.  Imaging: US BREAST LTD UNI RIGHT INC AXILLA  Result Date: 04/20/2020 CLINICAL DATA:  74 year old presenting with a possible palpable lump in the UPPER periareolar RIGHT breast. Personal history of malignant lumpectomy of the RIGHT breast in 2013, pathology low-grade DCIS (upgraded from ADH at core needle biopsy), with adjuvant radiation therapy. Annual evaluation, LEFT breast. EXAM: DIGITAL DIAGNOSTIC BILATERAL MAMMOGRAM WITH CAD AND TOMO ULTRASOUND RIGHT BREAST COMPARISON:  Previous exam(s). ACR Breast Density Category d: The breast tissue is extremely dense, which lowers the sensitivity of mammography. FINDINGS: Tomosynthesis and synthesized full field CC and MLO views of both breasts were obtained. Tomosynthesis and synthesized spot compression tangential view of the area of concern in the RIGHT breast was also obtained. No mammographic abnormality in the UPPER periareolar RIGHT breast in the area of palpable concern. Normal dense fibroglandular tissue is present in this location. Post surgical scar/architectural distortion at the lumpectomy site in the LOWER RIGHT breast at ANTERIOR to MIDDLE depth. No new or suspicious findings elsewhere in the RIGHT breast. No findings suspicious for malignancy in the LEFT breast. Mammographic images were processed with CAD. On correlative physical exam, there is a palpable ridge of tissue in the UPPER periareolar RIGHT breast corresponding to what the patient is feeling, though I do not palpate a discrete mass. Targeted RIGHT breast ultrasound is performed, showing normal dense fibroglandular tissue at the 12 o'clock position approximately 4 cm from the nipple  in the area of palpable concern. No cyst, solid mass or abnormal acoustic shadowing is identified. IMPRESSION: 1. No mammographic or sonographic evidence of malignancy involving the RIGHT breast. 2. No mammographic evidence of malignancy involving the LEFT breast. RECOMMENDATION: Screening mammogram in one year.(Code:SM-B-01Y) I have discussed the findings and recommendations with the patient. If applicable, a reminder letter will be sent to the patient regarding the next appointment. BI-RADS CATEGORY  2: Benign. Electronically Signed   By: Evangeline Dakin M.D.   On: 04/20/2020 14:42  MM DIAG BREAST TOMO BILATERAL  Result Date: 04/20/2020 CLINICAL DATA:  74 year old presenting with a possible palpable lump in the UPPER periareolar RIGHT breast. Personal history of malignant lumpectomy of the RIGHT breast in 2013, pathology low-grade DCIS (upgraded from ADH at core needle biopsy), with adjuvant radiation therapy. Annual evaluation, LEFT breast. EXAM: DIGITAL DIAGNOSTIC BILATERAL MAMMOGRAM WITH CAD AND TOMO ULTRASOUND RIGHT BREAST COMPARISON:  Previous exam(s). ACR Breast Density Category d: The breast tissue is extremely dense, which lowers the sensitivity of mammography. FINDINGS: Tomosynthesis and synthesized full field CC and MLO views of both breasts were obtained. Tomosynthesis and synthesized spot compression tangential view of the area of concern in the RIGHT breast was also obtained. No mammographic abnormality in the UPPER periareolar RIGHT breast in the area of palpable concern. Normal dense fibroglandular tissue is present in this location. Post surgical scar/architectural distortion at the lumpectomy site in the LOWER RIGHT breast at ANTERIOR to MIDDLE depth. No new or suspicious findings elsewhere in the RIGHT breast. No findings suspicious for malignancy in the LEFT breast. Mammographic images were processed with CAD. On correlative physical exam, there is a palpable ridge of tissue in the UPPER  periareolar RIGHT breast corresponding to what the patient is feeling, though I do not palpate a discrete mass. Targeted RIGHT breast ultrasound is performed, showing normal dense fibroglandular tissue at the 12 o'clock position approximately 4 cm from the nipple in the area of palpable concern. No cyst, solid mass or abnormal acoustic shadowing is identified. IMPRESSION: 1. No mammographic or sonographic evidence of malignancy involving the RIGHT breast. 2. No mammographic evidence of malignancy involving the LEFT breast. RECOMMENDATION: Screening mammogram in one year.(Code:SM-B-01Y) I have discussed the findings and recommendations with the patient. If applicable, a reminder letter will be sent to the patient regarding the next appointment. BI-RADS CATEGORY  2: Benign. Electronically Signed   By: Evangeline Dakin M.D.   On: 04/20/2020 14:42    Recent Labs: Lab Results  Component Value Date   WBC 7.6 02/26/2020   HGB 12.7 02/26/2020   PLT 269.0 02/26/2020   NA 139 02/26/2020   K 4.6 02/26/2020   CL 103 02/26/2020   CO2 31 02/26/2020   GLUCOSE 94 02/26/2020   BUN 13 02/26/2020   CREATININE 1.03 02/26/2020   BILITOT 0.4 02/26/2020   ALKPHOS 80 02/26/2020   AST 19 02/26/2020   ALT 11 02/26/2020   PROT 6.6 02/26/2020   ALBUMIN 3.9 02/26/2020   CALCIUM 9.2 02/26/2020   GFRAA 66 09/03/2018    Speciality Comments: No specialty comments available.  Procedures:  No procedures performed Allergies: Clindamycin hcl, Penicillins, Prednisone, Rosuvastatin, Baclofen, Cortisone, Lincomycin, Codeine, Erythromycin base, Fluzone [flu virus vaccine], Haemophilus influenzae, Latex, Pentazocine lactate, Pneumococcal vaccine polyvalent, and Tamoxifen   Assessment / Plan:     Visit Diagnoses: No diagnosis found.  Orders: No orders of the defined types were placed in this encounter.  No orders of the defined types were placed in this encounter.   Face-to-face time spent with patient was ***  minutes. Greater than 50% of time was spent in counseling and coordination of care.  Follow-Up Instructions: No follow-ups on file.   Ofilia Neas, PA-C  Note - This record has been created using Dragon software.  Chart creation errors have been sought, but may not always  have been located. Such creation errors do not reflect on  the standard of medical care.

## 2020-05-06 ENCOUNTER — Encounter: Payer: Self-pay | Admitting: Internal Medicine

## 2020-05-06 ENCOUNTER — Other Ambulatory Visit: Payer: Self-pay

## 2020-05-06 ENCOUNTER — Ambulatory Visit (INDEPENDENT_AMBULATORY_CARE_PROVIDER_SITE_OTHER): Payer: Medicare Other | Admitting: Internal Medicine

## 2020-05-06 VITALS — BP 141/72 | HR 72 | Temp 97.7°F | Resp 16 | Ht 64.5 in | Wt 133.0 lb

## 2020-05-06 DIAGNOSIS — N61 Mastitis without abscess: Secondary | ICD-10-CM | POA: Diagnosis not present

## 2020-05-06 NOTE — Patient Instructions (Signed)
Continue taking doxycycline until you finish  Monitor the area, if the redness gets worse, you have fever, chills let me know  If you are not gradually improving we might consider take another round of antibiotics, possibly Keflex  Make an appointment with your primary doctor in 1 month from today to recheck the area.

## 2020-05-06 NOTE — Progress Notes (Signed)
Pre visit review using our clinic review tool, if applicable. No additional management support is needed unless otherwise documented below in the visit note. 

## 2020-05-06 NOTE — Progress Notes (Signed)
Subjective:    Patient ID: [name], female    DOB: Feb 08, 1946, 74 y.o.   MRN: [id number]  DOS:  05/06/2020 Type of visit - description: Acute The patient has a history of breast cancer. 4 days ago noted some redness at the right breast along with warmness. Area gradually got worse until today, for the first time the redness seems less intense.  Denies any nipple discharge. No fever chills On recent self breast exam there is no particular lump. Denies any itching, hurting or blisters at the site of the rash. No recent breast injury.  She also has some similar redness at the right lower abdomen about the same time the other problem started but that is largely resolved.    Review of Systems See above   Past Medical History:  Diagnosis Date  . Allergy   . Anemia   . Anxiety    past hx   . Arthritis   . Asthma   . Atrial flutter ([place])    past history- not current  . CAD (coronary artery disease) CARDIOLOGIST--  DR Angelena Form   mild non-obstructive cad  . Cancer ([place])    right  . Cataract    bilaterally removed   . Chronic constipation   . Chronic kidney disease    interstitial cystitis  . COPD (chronic obstructive pulmonary disease) ([place])   . Depression    past hx   . Family history of malignant hyperthermia    father had this  . Fibromyalgia   . Frequency of urination   . GERD (gastroesophageal reflux disease)   . H/O hiatal hernia   . History of basal cell carcinoma excision    X2  . History of breast cancer ONCOLOGIST-- DR Jana Hakim---  NO RECURRANCE   DX 07/2012;  LOW GRADE DCIS  ER+PR+  ----  S/P RIGHT LUMPECTOMY WITH NEGATIVE MARGINS/   RADIATION ENDED 11/2012  . History of chronic bronchitis   . History of colonic polyps   . History of tachycardia    CONTROLLED  WITH ATENOLOL  . Hyperlipidemia   . Hypertension   . Neuromuscular disorder ([facility])    fibromyalgia  . Osteoporosis 01/2019   T score -2.2 stable/improved from prior study  . Pelvic pain     . Personal history of radiation therapy   . S/P radiation therapy 11/12/12 - 12/05/12   right Breast  . Sepsis ([place]) 2014   from UTI   . Sinus headache   . Urgency of urination     Past Surgical History:  Procedure Laterality Date  . [place] STUDY N/A 04/03/2016   Procedure: [place] STUDY;  Surgeon: Manus Gunning, MD;  Location: [facility];  Service: Gastroenterology;  Laterality: N/A;  . BREAST LUMPECTOMY Right 10-11-2012   W/ SLN BX  . CARDIAC CATHETERIZATION  09-13-2007  DR Lia Foyer   WELL-PRESERVED LVF/  DIFFUSE SCATTERED CORONARY CALCIFACATION AND ATHEROSCLEROSIS WITHOUT OBSTRUCTION  . CARDIAC CATHETERIZATION  08-04-2010  DR Angelena Form   NON-OBSTRUCTIVE CAD/  pLAD 40%/  oLAD 30%/  mLAD 30%/  pRCA 30%/  EF 60%  . CARDIOVASCULAR STRESS TEST  06-18-2012  DR McALHANY   LOW RISK NUCLEAR STUDY/  SMALL FIXED AREA OF MODERATELY DECREASED UPTAKE IN ANTEROSEPTAL WALL WHICH MAY BE ARTIFACTUAL/  NO ISCHEMIA/  EF 68%  . COLONOSCOPY  09-29-2010  . CYSTOSCOPY    . CYSTOSCOPY WITH HYDRODISTENSION AND BIOPSY N/A 03/06/2014   Procedure: CYSTOSCOPY/HYDRODISTENSION/ INSTILATION OF MARCAINE AND PYRIDIUM;  Surgeon: Ailene Rud, MD;  Location: [facility];  Service: Urology;  Laterality: N/A;  . ESOPHAGEAL MANOMETRY N/A 04/03/2016   Procedure: ESOPHAGEAL MANOMETRY (EM);  Surgeon: Manus Gunning, MD;  Location: [facility];  Service: Gastroenterology;  Laterality: N/A;  . EXTRACORPOREAL SHOCK WAVE LITHOTRIPSY Left 02/06/2019   Procedure: EXTRACORPOREAL SHOCK WAVE LITHOTRIPSY (ESWL);  Surgeon: Kathie Rhodes, MD;  Location: [facility];  Service: Urology;  Laterality: Left;  . NASAL SINUS SURGERY  1985  . ORIF RIGHT ANKLE  FX  2006  . POLYPECTOMY    . REMOVAL VOCAL CORD CYST  FEB 2014  . RIGHT BREAST BX  08-23-2012  . RIGHT HAND SURGERY  X3  LAST ONE 2009   INCLUDES  ORIF RIGHT 5TH FINGER AND REVISION TWICE  . SKIN CANCER EXCISION    . TONSILLECTOMY AND  ADENOIDECTOMY  AGE 28  . TOTAL ABDOMINAL HYSTERECTOMY W/ BILATERAL SALPINGOOPHORECTOMY  1982   W/  APPENDECTOMY  . TRANSTHORACIC ECHOCARDIOGRAM  06-24-2012   GRADE I DIASTOLIC DYSFUNCTION/  EF 55-60%/  MILD MR  . UPPER GASTROINTESTINAL ENDOSCOPY      Allergies as of 05/06/2020      Reactions   Clindamycin Hcl Shortness Of Breath, Rash   Penicillins Anaphylaxis   Prednisone Shortness Of Breath, Rash   Rosuvastatin Anaphylaxis   Baclofen Other (See Comments), Rash   Cortisone Other (See Comments)   Lincomycin Other (See Comments)   Codeine Hives, Other (See Comments)   headache   Erythromycin Base Other (See Comments)   other   Fluzone [flu Virus Vaccine] Other (See Comments)   Local reaction at the site   Haemophilus Influenzae Other (See Comments)   Local reaction at the site Local reaction at the site   Latex Hives   Pentazocine Lactate Other (See Comments)   HALLUCINATION   Pneumococcal Vaccine Polyvalent Hives, Swelling, Other (See Comments)   REACTION: redness, swelling, and hives at injection site   Tamoxifen Nausea And Vomiting, Other (See Comments)   HEADACHE      Medication List       Accurate as of May 06, 2020 11:59 PM. If you have any questions, ask your nurse or doctor.        acetaminophen 500 MG tablet Commonly known as: TYLENOL Take 1,000 mg by mouth every 6 (six) hours as needed for mild pain or headache.   albuterol 108 (90 Base) MCG/ACT inhaler Commonly known as: VENTOLIN HFA Inhale 2 puffs into the lungs every 4 (four) hours as needed.   amLODipine 5 MG tablet Commonly known as: NORVASC amlodipine 5 mg tablet   aspirin EC 81 MG tablet Take 1 tablet (81 mg total) by mouth daily.   atenolol 50 MG tablet Commonly known as: TENORMIN Take 1 tablet (50 mg total) by mouth 2 (two) times daily.   doxycycline 100 MG tablet Commonly known as: VIBRA-TABS Take 1 tablet (100 mg total) by mouth 2 (two) times daily.   DULoxetine 60 MG  capsule Commonly known as: CYMBALTA Take 2 capsules by mouth once daily   Estradiol 10 MCG Tabs vaginal tablet Place 1 tablet (10 mcg total) vaginally 2 (two) times a week.   fluticasone 50 MCG/ACT nasal spray Commonly known as: FLONASE Place 2 sprays into both nostrils daily.   furosemide 20 MG tablet Commonly known as: LASIX Take 1 tablet (20 mg total) by mouth as needed for edema.   gabapentin 300 MG capsule Commonly known as: NEURONTIN Take 300 mg by  mouth. 1cap in the morning, 1cap at noon, and 2caps at bedtime   hyoscyamine 0.125 MG SL tablet Commonly known as: LEVSIN SL Place 1 tablet (0.125 mg total) under the tongue every 4 (four) hours as needed.   linaclotide 145 MCG Caps capsule Commonly known as: Linzess Take 1 capsule (145 mcg total) by mouth daily before breakfast.   loratadine 10 MG tablet Commonly known as: CLARITIN Take 1 tablet (10 mg total) by mouth daily.   losartan 100 MG tablet Commonly known as: COZAAR Take 1 tablet by mouth once daily   mupirocin ointment 2 % Commonly known as: Bactroban Apply thin film twice daily.   nitroGLYCERIN 0.4 MG SL tablet Commonly known as: NITROSTAT Place 1 tablet (0.4 mg total) under the tongue every 5 (five) minutes as needed for chest pain.   omeprazole 40 MG capsule Commonly known as: PRILOSEC Take 1 capsule by mouth twice daily   pentosan polysulfate 100 MG capsule Commonly known as: Elmiron Take 1 capsule (100 mg total) by mouth 3 (three) times daily. Reported on 01/05/2016 What changed:   when to take this  reasons to take this   pramipexole 0.125 MG tablet Commonly known as: Mirapex Take 1-2 tablets (0.125-0.25 mg total) by mouth 3 (three) times daily.   Prolia 60 MG/ML Sosy injection Generic drug: denosumab Inject 60 mg into the skin every 6 (six) months.   Repatha SureClick [id number] MG/ML Soaj Generic drug: Evolocumab INJECT 140 MG SUBCUTANEOUSLY EVERY 14 DAYS   rOPINIRole 0.25 MG  tablet Commonly known as: REQUIP Take 1 tablet (0.25 mg total) by mouth 3 (three) times daily.   traZODone 100 MG tablet Commonly known as: DESYREL TAKE 1 TABLET BY MOUTH AT BEDTIME          Objective:   Physical Exam BP (!) 141/72 (BP Location: Left Arm, Patient Position: Sitting, Cuff Size: Small)   Pulse 72   Temp 97.7 F (36.5 C) (Temporal)   Resp 16   Ht 5' 4.5" (1.638 m)   Wt 133 lb (60.3 kg)   SpO2 100%   BMI 22.48 kg/m   General:   Well developed, NAD, BMI noted. HEENT:  Normocephalic . Face symmetric, atraumatic Neck: No lymphadenopathy, mass or lumps at the neck or supraclavicular areas. Lungs:  CTA B Normal respiratory effort, no intercostal retractions, no accessory muscle use. Heart: RRR,  no murmur.  Lower extremities: no pretibial edema bilaterally Breast exam: Both breasts are somewhat lumpy with generalized tenderness. On the right breast:  Well-healed surgical scar At the upper external quadrant there is mild redness, approximately 2x 2 inches in size, no peau d' orange, no warmness, no dominant mass. Axillary areas: No lymphadenopathy or masses noted. Skin: Not pale. Not jaundice Neurologic:  alert & oriented X3.  Speech normal, gait appropriate for age and unassisted Psych--  Cognition and judgment appear intact.  Cooperative with normal attention span and concentration.  Behavior appropriate. No anxious or depressed appearing.      Assessment    74 year old female, PMH includes CAD, HTN, asthma, Breast cancer, s/p R breast Bx and lumpectomy 2013, noninvasive, estrogen positive, XRT completed 12/05/2012, on multiple medications, present with  Breast cellulitis: Based on history and physical exam my impression is breast cellulitis, I cannot feel any particular lump today. Last month on self-exam she felt a lump on the right breast, Dx MMG & [place] were  (-) No dominant lumps on self exam this week. She is currently taking doxycycline for an  unrelated condition,  has 4 to 5 more days to go. Plan: Recheck clinically the right breast in 1 month, if  fever, chills, worsening symptoms come back before.  Consider add or switch doxycycline to Keflex if not improving (allergic to penicillin but tolerant to Keflex).  Multiple questions answer about why I think she has cellulitis, she is understandably anxious and she is counseled.    This visit occurred during the SARS-CoV-2 public health emergency.  Safety protocols were in place, including screening questions prior to the visit, additional usage of staff PPE, and extensive cleaning of exam room while observing appropriate contact time as indicated for disinfecting solutions.

## 2020-05-07 ENCOUNTER — Encounter: Payer: Self-pay | Admitting: Family Medicine

## 2020-05-07 ENCOUNTER — Other Ambulatory Visit: Payer: Self-pay | Admitting: Internal Medicine

## 2020-05-07 MED ORDER — CEPHALEXIN 500 MG PO CAPS: 500.0000 mg | ORAL_CAPSULE | Freq: Four times a day (QID) | ORAL | 0 refills | Status: DC

## 2020-05-11 ENCOUNTER — Other Ambulatory Visit: Payer: Self-pay

## 2020-05-11 ENCOUNTER — Encounter: Payer: Self-pay | Admitting: Physical Therapy

## 2020-05-11 ENCOUNTER — Ambulatory Visit: Payer: Medicare Other | Attending: Family Medicine | Admitting: Physical Therapy

## 2020-05-11 DIAGNOSIS — R2681 Unsteadiness on feet: Secondary | ICD-10-CM | POA: Diagnosis not present

## 2020-05-11 DIAGNOSIS — M545 Low back pain: Secondary | ICD-10-CM | POA: Diagnosis not present

## 2020-05-11 DIAGNOSIS — R296 Repeated falls: Secondary | ICD-10-CM | POA: Diagnosis not present

## 2020-05-11 DIAGNOSIS — M6281 Muscle weakness (generalized): Secondary | ICD-10-CM | POA: Diagnosis not present

## 2020-05-11 DIAGNOSIS — G8929 Other chronic pain: Secondary | ICD-10-CM | POA: Diagnosis not present

## 2020-05-11 DIAGNOSIS — R42 Dizziness and giddiness: Secondary | ICD-10-CM | POA: Diagnosis not present

## 2020-05-11 DIAGNOSIS — R2689 Other abnormalities of gait and mobility: Secondary | ICD-10-CM

## 2020-05-11 NOTE — Therapy (Signed)
[facility] [address]  [place], [place], [address] Phone: [phone]   Fax:  [phone]  Physical Therapy Evaluation  Patient Details  Name: [name] MRN: [id number] Date of Birth: May 02, 1946 Referring Provider (PT): [name], MD   Encounter Date: 05/11/2020  PT End of Session - 05/11/20 0920    Visit Number  1    Number of Visits  12    Date for PT Re-Evaluation  06/22/20    Authorization Type  [employer]    PT Start Time  0920    PT Stop Time  1026    PT Time Calculation (min)  66 min    Activity Tolerance  Patient tolerated treatment well    Behavior During Therapy  [facility] for tasks assessed/performed       Past Medical History:  Diagnosis Date  . Allergy   . Anemia   . Anxiety    past hx   . Arthritis   . Asthma   . Atrial flutter ([place])    past history- not current  . CAD (coronary artery disease) CARDIOLOGIST--  DR Angelena Form   mild non-obstructive cad  . Cancer ([place])    right  . Cataract    bilaterally removed   . Chronic constipation   . Chronic kidney disease    interstitial cystitis  . COPD (chronic obstructive pulmonary disease) ([place])   . Depression    past hx   . Family history of malignant hyperthermia    father had this  . Fibromyalgia   . Frequency of urination   . GERD (gastroesophageal reflux disease)   . H/O hiatal hernia   . History of basal cell carcinoma excision    X2  . History of breast cancer ONCOLOGIST-- DR Jana Hakim---  NO RECURRANCE   DX 07/2012;  LOW GRADE DCIS  ER+PR+  ----  S/P RIGHT LUMPECTOMY WITH NEGATIVE MARGINS/   RADIATION ENDED 11/2012  . History of chronic bronchitis   . History of colonic polyps   . History of tachycardia    CONTROLLED  WITH ATENOLOL  . Hyperlipidemia   . Hypertension   . Neuromuscular disorder (HCC)    fibromyalgia  . Osteoporosis 01/2019   T score -2.2 stable/improved from prior study  . Pelvic pain   . Personal  history of radiation therapy   . S/P radiation therapy 11/12/12 - 12/05/12   right Breast  . Sepsis ([place]) 2014   from UTI   . Sinus headache   . Urgency of urination     Past Surgical History:  Procedure Laterality Date  . [place] STUDY N/A 04/03/2016   Procedure: [place] STUDY;  Surgeon: Manus Gunning, MD;  Location: [facility];  Service: Gastroenterology;  Laterality: N/A;  . BREAST LUMPECTOMY Right 10-11-2012   W/ SLN BX  . CARDIAC CATHETERIZATION  09-13-2007  DR Lia Foyer   WELL-PRESERVED LVF/  DIFFUSE SCATTERED CORONARY CALCIFACATION AND ATHEROSCLEROSIS WITHOUT OBSTRUCTION  . CARDIAC CATHETERIZATION  08-04-2010  DR Angelena Form   NON-OBSTRUCTIVE CAD/  pLAD 40%/  oLAD 30%/  mLAD 30%/  pRCA 30%/  EF 60%  . CARDIOVASCULAR STRESS TEST  06-18-2012  DR McALHANY   LOW RISK NUCLEAR STUDY/  SMALL FIXED AREA OF MODERATELY DECREASED UPTAKE IN ANTEROSEPTAL WALL WHICH MAY BE ARTIFACTUAL/  NO ISCHEMIA/  EF 68%  . COLONOSCOPY  09-29-2010  . CYSTOSCOPY    . CYSTOSCOPY WITH HYDRODISTENSION AND BIOPSY N/A 03/06/2014  Procedure: CYSTOSCOPY/HYDRODISTENSION/ INSTILATION OF MARCAINE AND PYRIDIUM;  Surgeon: Ailene Rud, MD;  Location: [facility];  Service: Urology;  Laterality: N/A;  . ESOPHAGEAL MANOMETRY N/A 04/03/2016   Procedure: ESOPHAGEAL MANOMETRY (EM);  Surgeon: Manus Gunning, MD;  Location: [facility];  Service: Gastroenterology;  Laterality: N/A;  . EXTRACORPOREAL SHOCK WAVE LITHOTRIPSY Left 02/06/2019   Procedure: EXTRACORPOREAL SHOCK WAVE LITHOTRIPSY (ESWL);  Surgeon: Kathie Rhodes, MD;  Location: [facility];  Service: Urology;  Laterality: Left;  . NASAL SINUS SURGERY  1985  . ORIF RIGHT ANKLE  FX  2006  . POLYPECTOMY    . REMOVAL VOCAL CORD CYST  FEB 2014  . RIGHT BREAST BX  08-23-2012  . RIGHT HAND SURGERY  X3  LAST ONE 2009   INCLUDES  ORIF RIGHT 5TH FINGER AND REVISION TWICE  . SKIN CANCER EXCISION    . TONSILLECTOMY AND ADENOIDECTOMY  AGE 74   . TOTAL ABDOMINAL HYSTERECTOMY W/ BILATERAL SALPINGOOPHORECTOMY  1982   W/  APPENDECTOMY  . TRANSTHORACIC ECHOCARDIOGRAM  06-24-2012   GRADE I DIASTOLIC DYSFUNCTION/  EF 55-60%/  MILD MR  . UPPER GASTROINTESTINAL ENDOSCOPY      There were no vitals filed for this visit.   Subjective Assessment - 05/11/20 0926    Subjective  Pt reports she has fallen 2x in past 5 weeks (6 major falls in past 5-6 yrs necessitating ER visits - 2 concussions, finger fracture). 1st fall while in [employer] store (carrying cane over arm while carrying clothes and tripped over metal support for clothing rack) - fell into clothing rack bruising chest. 4 days later fell into rocking chair at home. Has done PT for back and neck in the past but has not kept up with HEP - feels like PT has also helped with her balance when she keeps up with exercise. Likes to garden but limited by back pain due to scoliosis. Goes to pain management clinic for fibromyalgia but currently only taking OTC pain meds.    Pertinent History  Recurrent falls - 2 in past 6 months (past 5 weeks), 6 major falls in past 5-6 yrs (2 concussions, finger fracture); costochondritis, cellulitis of R breast, fibromyalgia, chronic LBP since ~age 79, DDD, sciatica, osteoporosis, degenerative scoliosis, peripheral neuropathy, vertigo, R knee pain, CAD, COPD, HTN, h/o lumpectomy for R breast cancer 2013, ORIF R ankle s/p fall 2006    Limitations  Standing;Walking    How long can you stand comfortably?  10-15 minutes - limited by back/sacral pain    How long can you walk comfortably?  1.5 blocks    Patient Stated Goals  "Be able to gain some strength in my legs to help with my back. To be able stand w/o feeling like I'm swaying and walk w/o tripping."    Currently in Pain?  Yes    Pain Score  7     Pain Location  [place]    Pain Orientation  Lower    Pain Descriptors / Indicators  Sharp;Pressure    Pain Type  Acute pain    Pain Radiating Towards  across L  breast    Pain Frequency  Constant    Aggravating Factors   sharp pain on inhalation, pressure when laying on her back, bending forward    Pain Relieving Factors  laying on R side, sitting up straight wearing bras for support    Effect of Pain on Daily Activities  works through pain    Multiple Pain Sites  Yes    Pain Score  6   up to 8-9/10 at worst   Pain Location  Back    Pain Orientation  Lower    Pain Descriptors / Indicators  Stabbing   "grinding"   Pain Type  Chronic pain    Pain Radiating Towards  n/a    Pain Onset  Other (comment)   since ~age 8   Pain Frequency  Intermittent    Aggravating Factors   bending foward, prolonged sitting (such as sitting in church)    Pain Relieving Factors  moving around, heating pad (1st thing in the morning and PRN throughout day), sleep with pillow btw knees    Effect of Pain on Daily Activities  limited activity tolerance - has to take an easy day after a busy day; limited household cleaning         [facility] PT Assessment - 05/11/20 0920      Assessment   Medical Diagnosis  Multiple falls    Referring Provider (PT)  [name], MD    Onset Date/Surgical Date  --   ~5 weeks   Hand Dominance  Right    Next MD Visit  06/10/20    Prior Therapy  PT for back and neck, PT for vertigo      Precautions   Precautions  Fall      Balance Screen   Has the patient fallen in the past 6 months  Yes    How many times?  2    Has the patient had a decrease in activity level because of a fear of falling?   Yes    Is the patient reluctant to leave their home because of a fear of falling?   Yes      [place]  Private residence    Living Arrangements  Spouse/significant other    Type of [place] to enter    Entrance Stairs-Number of Steps  1    [place]  Two level;Bed/bath upstairs    Alternate Level Stairs-Number of Steps  15    Alternate Level Stairs-Rails  Right    [place] - single point;Walker - 2 wheels;Shower seat;Grab bars - tub/shower;Grab bars - toilet      Prior Function   Level of Independence  Independent    Vocation  Retired    [employer]  retired Therapist, sports    Leisure  gardening, reading, sewing, being outdoors (feels uncomfortable in back yard due to slope), walking 1/2 mile 2x/wk      Cognition   Overall Cognitive Status  Within Functional Limits for tasks assessed      Sensation   Light Touch  Impaired by gross assessment    Additional Comments  peripheral neuropathy      ROM / Strength   AROM / PROM / Strength  Strength      Strength   Strength Assessment Site  Hip;Knee;Ankle    Right/Left Hip  Right;Left    Right Hip Flexion  3+/5    Right Hip Extension  3+/5    Right Hip External Rotation   3+/5    Right Hip Internal Rotation  4-/5    Right Hip ABduction  3+/5    Right Hip ADduction  3/5    Left Hip Flexion  4-/5    Left Hip Extension  3+/5    Left Hip External Rotation  4-/5  Left Hip Internal Rotation  4-/5    Left Hip ABduction  3+/5    Left Hip ADduction  3+/5    Right/Left Knee  Right;Left    Right Knee Flexion  4-/5    Right Knee Extension  4-/5    Left Knee Flexion  4-/5    Left Knee Extension  4-/5    Right/Left Ankle  Right;Left    Right Ankle Dorsiflexion  3+/5    Right Ankle Plantar Flexion  3/5    Left Ankle Dorsiflexion  3+/5    Left Ankle Plantar Flexion  3/5      Ambulation/Gait   Ambulation/Gait Assistance  7: Independent    Assistive device  None    Gait Pattern  Step-through pattern;Decreased hip/knee flexion - right;Decreased hip/knee flexion - left;Poor foot clearance - left;Poor foot clearance - right;Lateral hip instability    Ambulation Surface  Level;Unlevel    Gait velocity  3.25 ft/sec    Gait Comments  Pt reports she uses a cane for balance when going shopping.      Standardized Balance Assessment   Standardized Balance Assessment  Berg Balance Test;Timed Up and Go Test;10 meter  walk test    10 Meter Walk  10.09      Berg Balance Test   Sit to Stand  Able to stand without using hands and stabilize independently    Standing Unsupported  Able to stand safely 2 minutes    Sitting with Back Unsupported but Feet Supported on Floor or Stool  Able to sit safely and securely 2 minutes    Stand to Sit  Sits safely with minimal use of hands    Transfers  Able to transfer safely, minor use of hands    Standing Unsupported with Eyes Closed  Able to stand 10 seconds with supervision    Standing Unsupported with Feet Together  Able to place feet together independently and stand for 1 minute with supervision    From Standing, Reach Forward with Outstretched Arm  Can reach forward >12 cm safely (5")    From Standing Position, Pick up Object from [place] to pick up shoe safely and easily    From Standing Position, Turn to Look Behind Over each Shoulder  Looks behind one side only/other side shows less weight shift    Turn 360 Degrees  Able to turn 360 degrees safely but slowly    Standing Unsupported, Alternately Place Feet on Step/Stool  Able to stand independently and complete 8 steps >20 seconds    Standing Unsupported, One Foot in Front  Able to take small step independently and hold 30 seconds    Standing on One Leg  Tries to lift leg/unable to hold 3 seconds but remains standing independently    Total Score  44    Berg comment:  37-45 significant risk for falls  (>80%)      Timed Up and Go Test   Normal TUG (seconds)  9.59                  Objective measurements completed on examination: See above findings.              PT Education - 05/11/20 1024    Education Details  PT eval findings and associated fall risk, anticipated POC    Person(s) Educated  Patient    Methods  Explanation    Comprehension  Verbalized understanding       PT Short Term Goals -  05/11/20 1026      PT SHORT TERM GOAL #1   Title  Patient will be independent with  initial HEP    Status  New    Target Date  05/25/20      PT SHORT TERM GOAL #2   Title  Complete FGA assessment    Status  New    Target Date  05/25/20        PT Long Term Goals - 05/11/20 1232      PT LONG TERM GOAL #1   Title  Patient will be independent with ongoing/advanced HEP    Status  New    Target Date  06/22/20      PT LONG TERM GOAL #2   Title  Patient will demonstrate improved B LE strength to >/= 4/5 to 4+/5 for improved stability and ease of mobility    Status  New    Target Date  06/22/20      PT LONG TERM GOAL #3   Title  Patient will improve Berg score to >/= 52/56 to improve safety stability with ADLs in standing and reduce risk for falls    Status  New    Target Date  06/22/20      PT LONG TERM GOAL #4   Title  Patient will report no further instances of falls    Status  New    Target Date  06/22/20             Plan - 05/11/20 1026    Clinical Impression Statement  Keyala is a 74 y/o female who presents to OP PT for unsteady gait and recurrent falls (2 in past 5 weeks). She reports h/o 6 major falls in past 5-6 yrs (2 concussions, finger fracture) with more remote h/o of ankle fracture due to a fall in 2006. One of recent falls has resulted in costochondritis due to trauma to her chest during the fall, which is complicated by active cellulitis of her R breast. She has completed multiple prior episodes of PT for back and R knee pain as well as vertigo, noting benefit for her balance during these episodes, but admits to poor follow through with HEPs and has been less active over past 1+ yr due to [place]. Current deficits include global B LE weakness (greatest at hips and ankles), gait abnormalities with decreased foot clearance, and a significant risk for falls (>80%) per Berg score of 44/56. She may also benefit from dynamic gait assessment via FGA. [place] will benefit from skilled PT to address above strength and balance deficits to increase safety/stability  with transfers, gait and ADLs to decrease risk for further falls. Will also address back and knee pain along with costochondritis as it affects her mobility and activity tolerance.    Personal Factors and Comorbidities  Comorbidity 3+;Age;Fitness;Past/Current Experience    Comorbidities  Recurrent falls - 2 in past 6 months (past 5 weeks), 6 major falls in past 5-6 yrs (2 concussions, finger fracture); costochondritis, cellulitis of R breast, fibromyalgia, chronic LBP since ~age 37, DDD, sciatica, osteoporosis, degenerative scoliosis, peripheral neuropathy, vertigo, R knee pain, CAD, COPD, HTN, h/o lumpectomy for R breast cancer 2013, ORIF R ankle s/p fall 2006    Examination-Activity Limitations  Bathing;Bend;Carry;Dressing;Lift;Locomotion Level;Stand    Examination-Participation Restrictions  Church;Community Activity;Interpersonal Relationship;Laundry;Meal Prep;Shop;Yard Work    Stability/Clinical Decision Making  Evolving/Moderate complexity    Clinical Decision Making  Moderate    Rehab Potential  Good    PT Frequency  2x / week    PT Duration  6 weeks    PT Treatment/Interventions  ADLs/Self Care Home Management;Cryotherapy;Electrical Stimulation;Iontophoresis 4mg /ml Dexamethasone;Moist Heat;Ultrasound;DME Instruction;Gait training;Stair training;Functional mobility training;Therapeutic activities;Therapeutic exercise;Balance training;Neuromuscular re-education;Patient/family education;Manual techniques;Passive range of motion;Dry needling;Taping;Vasopneumatic Device;Vestibular;Visual/perceptual remediation/compensation;Spinal Manipulations    PT Next Visit Plan  Establish initial strengthening HEP    Consulted and Agree with Plan of Care  Patient       Patient will benefit from skilled therapeutic intervention in order to improve the following deficits and impairments:  Abnormal gait, Decreased activity tolerance, Decreased balance, Decreased coordination, Decreased endurance, Decreased  knowledge of precautions, Decreased mobility, Decreased safety awareness, Decreased strength, Difficulty walking, Dizziness, Increased fascial restricitons, Increased muscle spasms, Impaired perceived functional ability, Impaired flexibility, Improper body mechanics, Postural dysfunction, Pain  Visit Diagnosis: Repeated falls  Unsteadiness on feet  Muscle weakness (generalized)  Other abnormalities of gait and mobility  Dizziness and giddiness  Chronic bilateral low back pain without sciatica     Problem List Patient Active Problem List   Diagnosis Date Noted  . Recurrent falls 05/02/2020  . Statin intolerance 02/29/2020  . RLS (restless legs syndrome) 11/09/2019  . Kidney stone 02/26/2019  . Recurrent sinusitis 02/25/2019  . Hx of colonic polyp 02/25/2019  . DDD (degenerative disc disease), cervical 09/17/2018  . Degenerative scoliosis 04/22/2018  . Primary insomnia 06/25/2017  . Chronic interstitial cystitis 02/01/2017  . Cough, persistent 01/09/2017  . Right arm pain 07/20/2016  . Deviated septum 05/12/2016  . Nasal turbinate hypertrophy 05/12/2016  . Dysphagia   . Allergic rhinitis 01/25/2016  . Other and combined forms of senile cataract 07/15/2015  . Dyspnea   . Left hip pain 04/30/2015  . Sciatica 04/30/2015  . Knee pain, right 04/30/2015  . Bilateral carpal tunnel syndrome 03/04/2015  . Hyperlipidemia 03/01/2015  . Pulmonary nodules 02/12/2015  . External hemorrhoids 02/05/2015  . Chronic night sweats 01/08/2015  . Atypical chest pain 01/01/2015  . Renal insufficiency 07/16/2014  . Memory loss 05/22/2014  . Peripheral neuropathy 05/22/2014  . Abdominal pain 10/26/2013  . Headache 10/13/2013  . Arthralgia 08/18/2013  . ANA positive 07/29/2013  . Depression 02/05/2013  . Family history of malignant hyperthermia 02/05/2013  . Malignant neoplasm of lower-outer quadrant of right breast of female, estrogen receptor positive ([place]) 01/03/2013  . Splenic  lesion 09/02/2012  . Asthma, allergic 08/05/2012  . GERD (gastroesophageal reflux disease) 06/03/2012  . Edema 04/08/2012  . Stricture and stenosis of esophagus 10/19/2011  . Functional constipation 07/21/2011  . Nonspecific (abnormal) findings on radiological and other examination of biliary tract 07/21/2011  . Osteoporosis 04/17/2011  . Leg pain 02/24/2011  . FIBROCYSTIC BREAST DISEASE, HX OF 10/18/2010  . Essential hypertension 08/25/2010  . CAD in native artery 08/25/2010  . TOBACCO ABUSE, HX OF 08/25/2010  . GENERALIZED ANXIETY DISORDER 08/03/2010  . Fibromyalgia 01/11/2010    Percival Spanish, PT, MPT 05/11/2020, 12:39 PM  [facility] [address]  [place], [place], [address] Phone: [phone]   Fax:  [phone]  Name: KHALIAH BOEHLKE MRN: [id number] Date of Birth: 03-29-1946

## 2020-05-12 ENCOUNTER — Ambulatory Visit (INDEPENDENT_AMBULATORY_CARE_PROVIDER_SITE_OTHER): Payer: Medicare Other | Admitting: Internal Medicine

## 2020-05-12 ENCOUNTER — Ambulatory Visit (HOSPITAL_BASED_OUTPATIENT_CLINIC_OR_DEPARTMENT_OTHER)
Admission: RE | Admit: 2020-05-12 | Discharge: 2020-05-12 | Disposition: A | Discharge status: Home / Self Care | Payer: Medicare Other | Source: Ambulatory Visit | Attending: Internal Medicine | Admitting: Internal Medicine

## 2020-05-12 ENCOUNTER — Ambulatory Visit: Payer: Medicare Other | Admitting: Medical

## 2020-05-12 ENCOUNTER — Encounter: Payer: Self-pay | Admitting: Internal Medicine

## 2020-05-12 VITALS — BP 149/66 | HR 79 | Temp 97.7°F | Resp 18 | Ht 64.5 in | Wt 133.2 lb

## 2020-05-12 DIAGNOSIS — J329 Chronic sinusitis, unspecified: Secondary | ICD-10-CM | POA: Diagnosis not present

## 2020-05-12 DIAGNOSIS — R05 Cough: Secondary | ICD-10-CM

## 2020-05-12 DIAGNOSIS — N61 Mastitis without abscess: Secondary | ICD-10-CM

## 2020-05-12 DIAGNOSIS — R509 Fever, unspecified: Secondary | ICD-10-CM | POA: Insufficient documentation

## 2020-05-12 DIAGNOSIS — R059 Cough, unspecified: Secondary | ICD-10-CM

## 2020-05-12 NOTE — Progress Notes (Signed)
Subjective:    Patient ID: [name], female    DOB: 28-Jun-1946, 74 y.o.   MRN: [id number]  DOS:  05/12/2020 Type of visit - description: Acute  Seen by me a few days ago with breast cellulitis, states that she is not any better, she feels "awful". Chart was reviewed: -04/29/2020 saw PCP, diagnosed with sinusitis, took doxycycline -05/02/2020 started with right breast redness, seen by me 4 days ago, diagnosis was breast cellulitis, she was already improving on doxycycline for sinusitis. -Shortly after she call and said that doxycycline was not helping so was switched to Keflex.   She is here because she is not any better, feeling very poorly. Redness at the breast unchanged per pt, on self-exam there is no lump but there is tenderness (she has generalized B breast tenderness).  Sinus infection is ongoing, still hurts more on the right side of the sinuses.  She has noted subjective fever but when checked, there is no increased temperature.  Review of Systems Denies itchy eyes or nose No sneezing + Clear nasal discharge + Nausea but no vomiting no diarrhea + Cough History of fibromyalgia, reports she is in the midst of a flareup with generalized pain.  Past Medical History:  Diagnosis Date  . Allergy   . Anemia   . Anxiety    past hx   . Arthritis   . Asthma   . Atrial flutter ([place])    past history- not current  . CAD (coronary artery disease) CARDIOLOGIST--  DR Angelena Form   mild non-obstructive cad  . Cancer ([place])    right  . Cataract    bilaterally removed   . Chronic constipation   . Chronic kidney disease    interstitial cystitis  . COPD (chronic obstructive pulmonary disease) ([place])   . Depression    past hx   . Family history of malignant hyperthermia    father had this  . Fibromyalgia   . Frequency of urination   . GERD (gastroesophageal reflux disease)   . H/O hiatal hernia   . History of basal cell carcinoma excision    X2  . History of breast cancer  ONCOLOGIST-- DR Jana Hakim---  NO RECURRANCE   DX 07/2012;  LOW GRADE DCIS  ER+PR+  ----  S/P RIGHT LUMPECTOMY WITH NEGATIVE MARGINS/   RADIATION ENDED 11/2012  . History of chronic bronchitis   . History of colonic polyps   . History of tachycardia    CONTROLLED  WITH ATENOLOL  . Hyperlipidemia   . Hypertension   . Neuromuscular disorder (HCC)    fibromyalgia  . Osteoporosis 01/2019   T score -2.2 stable/improved from prior study  . Pelvic pain   . Personal history of radiation therapy   . S/P radiation therapy 11/12/12 - 12/05/12   right Breast  . Sepsis ([place]) 2014   from UTI   . Sinus headache   . Urgency of urination     Past Surgical History:  Procedure Laterality Date  . [place] STUDY N/A 04/03/2016   Procedure: [place] STUDY;  Surgeon: Manus Gunning, MD;  [facility];  Service: Gastroenterology;  Laterality: N/A;  . BREAST LUMPECTOMY Right 10-11-2012   W/ SLN BX  . CARDIAC CATHETERIZATION  09-13-2007  DR Lia Foyer   WELL-PRESERVED LVF/  DIFFUSE SCATTERED CORONARY CALCIFACATION AND ATHEROSCLEROSIS WITHOUT OBSTRUCTION  . CARDIAC CATHETERIZATION  08-04-2010  DR Angelena Form   NON-OBSTRUCTIVE CAD/  pLAD 40%/  oLAD 30%/  mLAD  30%/  pRCA 30%/  EF 60%  . CARDIOVASCULAR STRESS TEST  06-18-2012  DR McALHANY   LOW RISK NUCLEAR STUDY/  SMALL FIXED AREA OF MODERATELY DECREASED UPTAKE IN ANTEROSEPTAL WALL WHICH MAY BE ARTIFACTUAL/  NO ISCHEMIA/  EF 68%  . COLONOSCOPY  09-29-2010  . CYSTOSCOPY    . CYSTOSCOPY WITH HYDRODISTENSION AND BIOPSY N/A 03/06/2014   Procedure: CYSTOSCOPY/HYDRODISTENSION/ INSTILATION OF MARCAINE AND PYRIDIUM;  Surgeon: Ailene Rud, MD;  Location: [facility];  Service: Urology;  Laterality: N/A;  . ESOPHAGEAL MANOMETRY N/A 04/03/2016   Procedure: ESOPHAGEAL MANOMETRY (EM);  Surgeon: Manus Gunning, MD;  [facility];  Service: Gastroenterology;  Laterality: N/A;  . EXTRACORPOREAL SHOCK WAVE LITHOTRIPSY  Left 02/06/2019   Procedure: EXTRACORPOREAL SHOCK WAVE LITHOTRIPSY (ESWL);  Surgeon: Kathie Rhodes, MD;  Location: [facility];  Service: Urology;  Laterality: Left;  . NASAL SINUS SURGERY  1985  . ORIF RIGHT ANKLE  FX  2006  . POLYPECTOMY    . REMOVAL VOCAL CORD CYST  FEB 2014  . RIGHT BREAST BX  08-23-2012  . RIGHT HAND SURGERY  X3  LAST ONE 2009   INCLUDES  ORIF RIGHT 5TH FINGER AND REVISION TWICE  . SKIN CANCER EXCISION    . TONSILLECTOMY AND ADENOIDECTOMY  AGE 29  . TOTAL ABDOMINAL HYSTERECTOMY W/ BILATERAL SALPINGOOPHORECTOMY  1982   W/  APPENDECTOMY  . TRANSTHORACIC ECHOCARDIOGRAM  06-24-2012   GRADE I DIASTOLIC DYSFUNCTION/  EF 55-60%/  MILD MR  . UPPER GASTROINTESTINAL ENDOSCOPY      Allergies as of 05/12/2020      Reactions   Clindamycin Hcl Shortness Of Breath, Rash   Penicillins Anaphylaxis   Prednisone Shortness Of Breath, Rash   Rosuvastatin Anaphylaxis   Baclofen Other (See Comments), Rash   Cortisone Other (See Comments)   Lincomycin Other (See Comments)   Codeine Hives, Other (See Comments)   headache   Erythromycin Base Other (See Comments)   other   Fluzone [flu Virus Vaccine] Other (See Comments)   Local reaction at the site   Haemophilus Influenzae Other (See Comments)   Local reaction at the site Local reaction at the site   Latex Hives   Pentazocine Lactate Other (See Comments)   HALLUCINATION   Pneumococcal Vaccine Polyvalent Hives, Swelling, Other (See Comments)   REACTION: redness, swelling, and hives at injection site   Tamoxifen Nausea And Vomiting, Other (See Comments)   HEADACHE      Medication List       Accurate as of May 12, 2020 11:59 PM. If you have any questions, ask your nurse or doctor.        acetaminophen 500 MG tablet Commonly known as: TYLENOL Take 1,000 mg by mouth every 6 (six) hours as needed for mild pain or headache.   albuterol 108 (90 Base) MCG/ACT inhaler Commonly known as: VENTOLIN HFA Inhale 2 puffs into the lungs  every 4 (four) hours as needed.   amLODipine 5 MG tablet Commonly known as: NORVASC amlodipine 5 mg tablet   aspirin EC 81 MG tablet Take 1 tablet (81 mg total) by mouth daily.   atenolol 50 MG tablet Commonly known as: TENORMIN Take 1 tablet (50 mg total) by mouth 2 (two) times daily.   cephALEXin 500 MG capsule Commonly known as: KEFLEX Take 1 capsule (500 mg total) by mouth 4 (four) times daily.   DULoxetine 60 MG capsule Commonly known as: CYMBALTA Take 2 capsules by mouth once daily  Estradiol 10 MCG Tabs vaginal tablet Place 1 tablet (10 mcg total) vaginally 2 (two) times a week.   fluticasone 50 MCG/ACT nasal spray Commonly known as: FLONASE Place 2 sprays into both nostrils daily.   furosemide 20 MG tablet Commonly known as: LASIX Take 1 tablet (20 mg total) by mouth as needed for edema.   gabapentin 300 MG capsule Commonly known as: NEURONTIN Take 300 mg by mouth. 1cap in the morning, 1cap at noon, and 2caps at bedtime   hyoscyamine 0.125 MG SL tablet Commonly known as: LEVSIN SL Place 1 tablet (0.125 mg total) under the tongue every 4 (four) hours as needed.   linaclotide 145 MCG Caps capsule Commonly known as: Linzess Take 1 capsule (145 mcg total) by mouth daily before breakfast.   loratadine 10 MG tablet Commonly known as: CLARITIN Take 1 tablet (10 mg total) by mouth daily.   losartan 100 MG tablet Commonly known as: COZAAR Take 1 tablet by mouth once daily   mupirocin ointment 2 % Commonly known as: Bactroban Apply thin film twice daily.   nitroGLYCERIN 0.4 MG SL tablet Commonly known as: NITROSTAT Place 1 tablet (0.4 mg total) under the tongue every 5 (five) minutes as needed for chest pain.   omeprazole 40 MG capsule Commonly known as: PRILOSEC Take 1 capsule by mouth twice daily   pentosan polysulfate 100 MG capsule Commonly known as: Elmiron Take 1 capsule (100 mg total) by mouth 3 (three) times daily. Reported on 01/05/2016 What  changed:   when to take this  reasons to take this   pramipexole 0.125 MG tablet Commonly known as: Mirapex Take 1-2 tablets (0.125-0.25 mg total) by mouth 3 (three) times daily.   Prolia 60 MG/ML Sosy injection Generic drug: denosumab Inject 60 mg into the skin every 6 (six) months.   Repatha SureClick XX123456 MG/ML Soaj Generic drug: Evolocumab INJECT 140 MG SUBCUTANEOUSLY EVERY 14 DAYS   rOPINIRole 0.25 MG tablet Commonly known as: REQUIP Take 1 tablet (0.25 mg total) by mouth 3 (three) times daily.   traZODone 100 MG tablet Commonly known as: DESYREL TAKE 1 TABLET BY MOUTH AT BEDTIME          Objective:   Physical Exam BP (!) 149/66 (BP Location: Left Arm, Patient Position: Sitting, Cuff Size: Small)   Pulse 79   Temp 97.7 F (36.5 C) (Temporal)   Resp 18   Ht 5' 4.5" (1.638 m)   Wt 133 lb 4 oz (60.4 kg)   SpO2 99%   BMI 22.52 kg/m   General:   Well developed, NAD, BMI noted.  HEENT:  Normocephalic . Face symmetric, atraumatic. Ears: Both TMs normal, canals normal Throat: Symmetric not red Sinuses: Definitely TTP on the right side.  Nose minimally congested. Lungs:  CTA B Normal respiratory effort, no intercostal retractions, no accessory muscle use. Heart: RRR,  no murmur.  Breasts: Since the last office visit, the mild redness at the upper external quadrant of the right breast is there but less noticeable. Both breasts are quite tender to palpation without dominant mass. No axillary lymphadenopathies. Abdomen:  Not distended, soft, non-tender. No rebound or rigidity.   The patient points to an area where she has redness at the R lower abdomen particularly after taking a shower, is really hard to tell if there is any erythema there.  Certainly there is no swelling or warmness at the skin of the abdomen.. Skin: Not pale. Not jaundice Lower extremities: no pretibial edema bilaterally  Neurologic:  alert & oriented X3.  Speech normal, gait appropriate for  age and unassisted Psych--  Cognition and judgment appear intact.  Cooperative with normal attention span and concentration.  Behavior appropriate. Seems very concerned and apprehensive    Assessment     74 year old female, PMH includes CAD, HTN, asthma, Breast cancer, s/p R breast Bx and lumpectomy 2013, noninvasive, estrogen positive, XRT completed 12/05/2012, on multiple medications, present with  Breast cellulitis: Diagnosed with breast cellulitis few days ago based on history and physical exam, currently on Keflex. Again recent diagnostic right breast MMG and ultrasound were negative. The area does not seem to be much worse if anything the redness is less noticeable. Continue Keflex Sinusitis: The patient continue with severe sinus pain, mostly at the right side. Exam is benign other than tenderness at the right maxillary area. Plan: CT sinuses Subjective fever, "feeling awful", cough: Unclear etiology, will get CBC, CMP, chest x-ray, sed rate  Also recommend to get a Covid test RTC 2 weeks   This visit occurred during the SARS-CoV-2 public health emergency.  Safety protocols were in place, including screening questions prior to the visit, additional usage of staff PPE, and extensive cleaning of exam room while observing appropriate contact time as indicated for disinfecting solutions.

## 2020-05-12 NOTE — Patient Instructions (Addendum)
Continue Keflex  Schedule a Covid test: Text COVID to : [id number] Call [phone]  Go to our [place] location across from [facility] tomorrow, walk-in to the basement and get your blood work   [place], [place] back for a checkup in 2 weeks please make your appointment   STOP BY THE FIRST FLOOR:  get the XR and is scheduled your CT of the sinuses

## 2020-05-12 NOTE — Progress Notes (Signed)
Pre visit review using our clinic review tool, if applicable. No additional management support is needed unless otherwise documented below in the visit note. 

## 2020-05-13 ENCOUNTER — Other Ambulatory Visit (INDEPENDENT_AMBULATORY_CARE_PROVIDER_SITE_OTHER): Payer: Medicare Other

## 2020-05-13 ENCOUNTER — Other Ambulatory Visit: Payer: Self-pay | Admitting: Family Medicine

## 2020-05-13 ENCOUNTER — Telehealth: Payer: Self-pay | Admitting: Family Medicine

## 2020-05-13 DIAGNOSIS — R509 Fever, unspecified: Secondary | ICD-10-CM | POA: Diagnosis not present

## 2020-05-13 LAB — CBC WITH DIFFERENTIAL/PLATELET
Basophils Absolute: 0.1 10*3/uL (ref 0.0–0.1)
Basophils Relative: 0.9 % (ref 0.0–3.0)
Eosinophils Absolute: 0.2 10*3/uL (ref 0.0–0.7)
Eosinophils Relative: 3.3 % (ref 0.0–5.0)
HCT: 38.5 % (ref 36.0–46.0)
Hemoglobin: 12.6 g/dL (ref 12.0–15.0)
Lymphocytes Relative: 21.6 % (ref 12.0–46.0)
Lymphs Abs: 1.4 10*3/uL (ref 0.7–4.0)
MCHC: 32.6 g/dL (ref 30.0–36.0)
MCV: 93.7 fl (ref 78.0–100.0)
Monocytes Absolute: 0.6 10*3/uL (ref 0.1–1.0)
Monocytes Relative: 9.1 % (ref 3.0–12.0)
Neutro Abs: 4.3 10*3/uL (ref 1.4–7.7)
Neutrophils Relative %: 65.1 % (ref 43.0–77.0)
Platelets: 259 10*3/uL (ref 150.0–400.0)
RBC: 4.11 Mil/uL (ref 3.87–5.11)
RDW: 15.7 % — ABNORMAL HIGH (ref 11.5–15.5)
WBC: 6.6 10*3/uL (ref 4.0–10.5)

## 2020-05-13 LAB — SEDIMENTATION RATE: Sed Rate: 19 mm/hr (ref 0–30)

## 2020-05-13 LAB — COMPREHENSIVE METABOLIC PANEL
ALT: 8 U/L (ref 0–35)
AST: 16 U/L (ref 0–37)
Albumin: 4 g/dL (ref 3.5–5.2)
Alkaline Phosphatase: 80 U/L (ref 39–117)
BUN: 14 mg/dL (ref 6–23)
CO2: 29 mEq/L (ref 19–32)
Calcium: 9 mg/dL (ref 8.4–10.5)
Chloride: 106 mEq/L (ref 96–112)
Creatinine, Ser: 0.98 mg/dL (ref 0.40–1.20)
GFR: 55.35 mL/min — ABNORMAL LOW (ref >60.00)
Glucose, Bld: 97 mg/dL (ref 70–99)
Potassium: 3.7 mEq/L (ref 3.5–5.1)
Sodium: 139 mEq/L (ref 135–145)
Total Bilirubin: 0.5 mg/dL (ref 0.2–1.2)
Total Protein: 6.7 g/dL (ref 6.0–8.3)

## 2020-05-13 NOTE — Telephone Encounter (Signed)
Caller: Vaughan Basta Call back phone number: [phone]  Patient states she was seeing by Dr.Paz on 05/12/20. Dr. Larose Kells recommended her to get a Covid test. Patient has a schedule an appointment for 05/17/20. Patient is wondering if she needs to quarantine.  Please advise.

## 2020-05-14 ENCOUNTER — Ambulatory Visit: Payer: Medicare Other

## 2020-05-14 NOTE — Telephone Encounter (Signed)
Advise patient, she does not need to quarantine, labs and x-ray were normal

## 2020-05-14 NOTE — Telephone Encounter (Signed)
Spoke w/ Pt- informed of PCP recommendations and results. Pt verbalized understanding.

## 2020-05-14 NOTE — Telephone Encounter (Signed)
Please advise 

## 2020-05-17 ENCOUNTER — Ambulatory Visit: Payer: Medicare Other | Attending: Internal Medicine

## 2020-05-17 DIAGNOSIS — Z20822 Contact with and (suspected) exposure to covid-19: Secondary | ICD-10-CM | POA: Diagnosis not present

## 2020-05-18 ENCOUNTER — Ambulatory Visit: Payer: Medicare Other | Admitting: Rheumatology

## 2020-05-18 LAB — SARS-COV-2, NAA 2 DAY TAT

## 2020-05-18 LAB — NOVEL CORONAVIRUS, NAA: SARS-CoV-2, NAA: NOT DETECTED

## 2020-05-19 DIAGNOSIS — M3500 Sicca syndrome, unspecified: Secondary | ICD-10-CM | POA: Diagnosis not present

## 2020-05-19 DIAGNOSIS — R768 Other specified abnormal immunological findings in serum: Secondary | ICD-10-CM | POA: Diagnosis not present

## 2020-05-19 DIAGNOSIS — R21 Rash and other nonspecific skin eruption: Secondary | ICD-10-CM | POA: Diagnosis not present

## 2020-05-19 DIAGNOSIS — M2550 Pain in unspecified joint: Secondary | ICD-10-CM | POA: Diagnosis not present

## 2020-05-23 NOTE — Progress Notes (Signed)
HPI: FU CAD.Cardiac catheterization in August 2011 showed a 40% proximal LAD, 30% mid LAD, 30% RCA and her ejection fraction was 60%. Nuclear study in June 2013 showed no ischemia with an ejection fraction of 68%. Nuclear study March 2016 showed ejection fraction 67%. Breast attenuation but no ischemia.MRA of the headOctober 2018 normal.Renal dopplers 1/19 showed no RAS. Patient recently seen by primary care for increasing dyspnea on exertion.  Echocardiogram repeated July 2020 and showed normal LV function. Since last seen she notes some dyspnea on exertion but no orthopnea, PND or pedal edema.  She has a "grabbing" in her chest for 2 to 3 minutes that can occur both at rest and with exertion.  She has had these similar symptoms in the past.  No syncope.  Current Outpatient Medications  Medication Sig Dispense Refill  . acetaminophen (TYLENOL) 500 MG tablet Take 1,000 mg by mouth every 6 (six) hours as needed for mild pain or headache.     . albuterol (PROVENTIL HFA;VENTOLIN HFA) 108 (90 BASE) MCG/ACT inhaler Inhale 2 puffs into the lungs every 4 (four) hours as needed. 1 Inhaler 5  . amLODipine (NORVASC) 5 MG tablet amlodipine 5 mg tablet    . aspirin EC 81 MG tablet Take 1 tablet (81 mg total) by mouth daily. 90 tablet 3  . atenolol (TENORMIN) 50 MG tablet Take 1 tablet (50 mg total) by mouth 2 (two) times daily. 180 tablet 1  . denosumab (PROLIA) 60 MG/ML SOSY injection Inject 60 mg into the skin every 6 (six) months.    . DULoxetine (CYMBALTA) 60 MG capsule Take 2 capsules by mouth once daily 90 capsule 0  . Estradiol 10 MCG TABS vaginal tablet Place 1 tablet (10 mcg total) vaginally 2 (two) times a week. 24 tablet 4  . fluticasone (FLONASE) 50 MCG/ACT nasal spray Place 2 sprays into both nostrils daily. 16 g 5  . furosemide (LASIX) 20 MG tablet Take 1 tablet (20 mg total) by mouth as needed for edema. 90 tablet 3  . gabapentin (NEURONTIN) 300 MG capsule Take 300 mg by mouth. 1cap  in the morning, 1cap at noon, and 2caps at bedtime    . hyoscyamine (LEVSIN SL) 0.125 MG SL tablet Place 1 tablet (0.125 mg total) under the tongue every 4 (four) hours as needed. 30 tablet 0  . linaclotide (LINZESS) 145 MCG CAPS capsule Take 1 capsule (145 mcg total) by mouth daily before breakfast. 30 capsule 5  . loratadine (CLARITIN) 10 MG tablet Take 1 tablet (10 mg total) by mouth daily. 30 tablet 11  . losartan (COZAAR) 100 MG tablet Take 1 tablet by mouth once daily 90 tablet 2  . mupirocin ointment (BACTROBAN) 2 % Apply thin film twice daily. 22 g 0  . nitroGLYCERIN (NITROSTAT) 0.4 MG SL tablet Place 1 tablet (0.4 mg total) under the tongue every 5 (five) minutes as needed for chest pain. 25 tablet 1  . omeprazole (PRILOSEC) 40 MG capsule Take 1 capsule by mouth twice daily 90 capsule 0  . pentosan polysulfate (ELMIRON) 100 MG capsule Take 1 capsule (100 mg total) by mouth 3 (three) times daily. Reported on 01/05/2016 (Patient taking differently: Take 100 mg by mouth 3 (three) times daily with meals as needed. Reported on 01/05/2016) 90 capsule 11  . pramipexole (MIRAPEX) 0.125 MG tablet Take 1-2 tablets (0.125-0.25 mg total) by mouth 3 (three) times daily. 60 tablet 1  . REPATHA SURECLICK [id number] MG/ML SOAJ INJECT 140 MG  SUBCUTANEOUSLY EVERY 14 DAYS 2 pen 11  . rOPINIRole (REQUIP) 0.25 MG tablet Take 1 tablet (0.25 mg total) by mouth 3 (three) times daily. 30 tablet 5  . traZODone (DESYREL) 100 MG tablet TAKE 1 TABLET BY MOUTH AT BEDTIME 90 tablet 0   No current facility-administered medications for this visit.   Facility-Administered Medications Ordered in Other Visits  Medication Dose Route Frequency Provider Last Rate Last Admin  . bupivacaine (MARCAINE) 0.5 % 10 mL, triamcinolone acetonide (KENALOG-40) 40 mg injection   Subcutaneous Once Carolan Clines, MD      . bupivacaine (MARCAINE) 0.5 % 15 mL, phenazopyridine (PYRIDIUM) 400 mg bladder mixture   Bladder Instillation Once  Carolan Clines, MD         Past Medical History:  Diagnosis Date  . Allergy   . Anemia   . Anxiety    past hx   . Arthritis   . Asthma   . Atrial flutter ([place])    past history- not current  . CAD (coronary artery disease) CARDIOLOGIST--  DR Angelena Form   mild non-obstructive cad  . Cancer ([place])    right  . Cataract    bilaterally removed   . Chronic constipation   . Chronic kidney disease    interstitial cystitis  . COPD (chronic obstructive pulmonary disease) ([place])   . Depression    past hx   . Family history of malignant hyperthermia    father had this  . Fibromyalgia   . Frequency of urination   . GERD (gastroesophageal reflux disease)   . H/O hiatal hernia   . History of basal cell carcinoma excision    X2  . History of breast cancer ONCOLOGIST-- DR Jana Hakim---  NO RECURRANCE   DX 07/2012;  LOW GRADE DCIS  ER+PR+  ----  S/P RIGHT LUMPECTOMY WITH NEGATIVE MARGINS/   RADIATION ENDED 11/2012  . History of chronic bronchitis   . History of colonic polyps   . History of tachycardia    CONTROLLED  WITH ATENOLOL  . Hyperlipidemia   . Hypertension   . Neuromuscular disorder (HCC)    fibromyalgia  . Osteoporosis 01/2019   T score -2.2 stable/improved from prior study  . Pelvic pain   . Personal history of radiation therapy   . S/P radiation therapy 11/12/12 - 12/05/12   right Breast  . Sepsis ([place]) 2014   from UTI   . Sinus headache   . Urgency of urination     Past Surgical History:  Procedure Laterality Date  . [place] STUDY N/A 04/03/2016   Procedure: [place] STUDY;  Surgeon: Manus Gunning, MD;  [facility];  Service: Gastroenterology;  Laterality: N/A;  . BREAST LUMPECTOMY Right 10-11-2012   W/ SLN BX  . CARDIAC CATHETERIZATION  09-13-2007  DR Lia Foyer   WELL-PRESERVED LVF/  DIFFUSE SCATTERED CORONARY CALCIFACATION AND ATHEROSCLEROSIS WITHOUT OBSTRUCTION  . CARDIAC CATHETERIZATION  08-04-2010  DR Angelena Form   NON-OBSTRUCTIVE CAD/   pLAD 40%/  oLAD 30%/  mLAD 30%/  pRCA 30%/  EF 60%  . CARDIOVASCULAR STRESS TEST  06-18-2012  DR McALHANY   LOW RISK NUCLEAR STUDY/  SMALL FIXED AREA OF MODERATELY DECREASED UPTAKE IN ANTEROSEPTAL WALL WHICH MAY BE ARTIFACTUAL/  NO ISCHEMIA/  EF 68%  . COLONOSCOPY  09-29-2010  . CYSTOSCOPY    . CYSTOSCOPY WITH HYDRODISTENSION AND BIOPSY N/A 03/06/2014   Procedure: CYSTOSCOPY/HYDRODISTENSION/ INSTILATION OF MARCAINE AND PYRIDIUM;  Surgeon: Ailene Rud, MD;  Location: [facility];  Service: Urology;  Laterality: N/A;  . ESOPHAGEAL MANOMETRY N/A 04/03/2016   Procedure: ESOPHAGEAL MANOMETRY (EM);  Surgeon: Manus Gunning, MD;  [facility];  Service: Gastroenterology;  Laterality: N/A;  . EXTRACORPOREAL SHOCK WAVE LITHOTRIPSY Left 02/06/2019   Procedure: EXTRACORPOREAL SHOCK WAVE LITHOTRIPSY (ESWL);  Surgeon: Kathie Rhodes, MD;  [facility];  Service: Urology;  Laterality: Left;  . NASAL SINUS SURGERY  1985  . ORIF RIGHT ANKLE  FX  2006  . POLYPECTOMY    . REMOVAL VOCAL CORD CYST  FEB 2014  . RIGHT BREAST BX  08-23-2012  . RIGHT HAND SURGERY  X3  LAST ONE 2009   INCLUDES  ORIF RIGHT 5TH FINGER AND REVISION TWICE  . SKIN CANCER EXCISION    . TONSILLECTOMY AND ADENOIDECTOMY  AGE 74  . TOTAL ABDOMINAL HYSTERECTOMY W/ BILATERAL SALPINGOOPHORECTOMY  1982   W/  APPENDECTOMY  . TRANSTHORACIC ECHOCARDIOGRAM  06-24-2012   GRADE I DIASTOLIC DYSFUNCTION/  EF 55-60%/  MILD MR  . UPPER GASTROINTESTINAL ENDOSCOPY      Social History   Socioeconomic History  . Marital status: Married    Spouse name: Jori Moll   . Number of children: 3  . Years of education: BA  . Highest education level: Not on file  Occupational History  . Occupation: Retired Programmer, multimedia: RETIRED  Tobacco Use  . Smoking status: Former Smoker    Packs/day: 1.00    Years: 15.00    Pack years: 15.00    Types: Cigarettes    Quit date: 05/27/2005    Years since quitting: 15.0  .  Smokeless tobacco: Never Used  Substance and Sexual Activity  . Alcohol use: No    Alcohol/week: 0.0 standard drinks  . Drug use: No  . Sexual activity: Yes    Partners: Male    Comment: 1st intercourse 74 yo-Fewer than 5 partners  Other Topics Concern  . Not on file  Social History Narrative   Lives with husband.   Caffeine use: 1/2 cup per day   Exercise-- 2days a week [employer],  Water aerobics, walking       Retired Marine scientist   No dietary restrictions, tries to maintain a heart healthy diet   Social Determinants of Radio broadcast assistant Strain: Low Risk   . Difficulty of Paying Living Expenses: Not hard at all  Food Insecurity: No Food Insecurity  . Worried About Charity fundraiser in the Last Year: Never true  . Ran Out of Food in the Last Year: Never true  Transportation Needs: No Transportation Needs  . Lack of Transportation (Medical): No  . Lack of Transportation (Non-Medical): No  Physical Activity:   . Days of Exercise per Week:   . Minutes of Exercise per Session:   Stress:   . Feeling of Stress :   Social Connections:   . Frequency of Communication with Friends and Family:   . Frequency of Social Gatherings with Friends and Family:   . Attends Religious Services:   . Active Member of Clubs or Organizations:   . Attends Archivist Meetings:   Marland Kitchen Marital Status:   Intimate Partner Violence:   . Fear of Current or Ex-Partner:   . Emotionally Abused:   Marland Kitchen Physically Abused:   . Sexually Abused:     Family History  Problem Relation Age of Onset  . Rectal cancer Mother   . Colon cancer Mother   . Pancreatic  cancer Mother   . Diabetes Mother   . Breast cancer Mother 39  . Breast cancer Maternal Aunt        breast  . Irritable bowel syndrome Son   . Heart disease Son        CAD, MV replacement  . Allergic Disorder Daughter   . Colon cancer Father   . Colon polyps Father   . Diabetes Father   . Stroke Father   . Heart disease Father         CHF  . Hyperlipidemia Father   . Hypertension Father   . Arthritis Father   . Breast cancer Paternal Aunt   . Breast cancer Paternal Aunt   . Arthritis Paternal Uncle   . Allergic Disorder Daughter   . Esophageal cancer Neg Hx   . Stomach cancer Neg Hx     ROS: no fevers or chills, productive cough, hemoptysis, dysphasia, odynophagia, melena, hematochezia, dysuria, hematuria, rash, seizure activity, orthopnea, PND, pedal edema, claudication. Remaining systems are negative.  Physical Exam: Well-developed well-nourished in no acute distress.  Skin is warm and dry.  HEENT is normal.  Neck is supple.  Chest is clear to auscultation with normal expansion.  Cardiovascular exam is regular rate and rhythm.  Abdominal exam nontender or distended. No masses palpated. Extremities show no edema. neuro grossly intact  ECG-normal sinus rhythm at a rate of 69, normal axis, nonspecific ST changes.  Personally reviewed  A/P  1 coronary artery disease-Plan to continue medical therapy.  Continue aspirin.  Intolerant to statins.  2 hypertension-patient's blood pressure is elevated; increase amlodipine to 10 mg daily and follow.  3 hyperlipidemia-intolerant to statins.  Continue Repatha.  4 chest pain-patient has atypical chest pain that is nonexertional.  We discussed repeating her functional study today but she would prefer conservative measures for now.  If her symptoms worsen we will proceed at that time.  Kirk Ruths, MD

## 2020-05-25 ENCOUNTER — Ambulatory Visit: Payer: Medicare Other | Admitting: Physical Therapy

## 2020-05-25 ENCOUNTER — Ambulatory Visit: Payer: Medicare Other | Admitting: Cardiology

## 2020-05-25 ENCOUNTER — Other Ambulatory Visit: Payer: Self-pay

## 2020-05-25 ENCOUNTER — Encounter: Payer: Self-pay | Admitting: Cardiology

## 2020-05-25 VITALS — BP 140/90 | HR 69 | Temp 96.8°F | Ht 64.0 in | Wt 139.2 lb

## 2020-05-25 DIAGNOSIS — I10 Essential (primary) hypertension: Secondary | ICD-10-CM | POA: Diagnosis not present

## 2020-05-25 DIAGNOSIS — E785 Hyperlipidemia, unspecified: Secondary | ICD-10-CM | POA: Diagnosis not present

## 2020-05-25 DIAGNOSIS — I2510 Atherosclerotic heart disease of native coronary artery without angina pectoris: Secondary | ICD-10-CM

## 2020-05-25 MED ORDER — AMLODIPINE BESYLATE 10 MG PO TABS: 10.0000 mg | ORAL_TABLET | Freq: Every day | ORAL | 3 refills | Status: DC

## 2020-05-25 NOTE — Patient Instructions (Signed)
Medication Instructions:  INCREASE AMLODIPINE TO 10 MG ONCE DAILY= 2 OF THE 5 MG TABLETS ONCE DAILY  *If you need a refill on your cardiac medications before your next appointment, please call your pharmacy*   Lab Work: If you have labs (blood work) drawn today and your tests are completely normal, you will receive your results only by: Marland Kitchen MyChart Message (if you have MyChart) OR . A paper copy in the mail If you have any lab test that is abnormal or we need to change your treatment, we will call you to review the results.  Follow-Up: At [facility], you and your health needs are our priority.  As part of our continuing mission to provide you with exceptional heart care, we have created designated Provider Care Teams.  These Care Teams include your primary Cardiologist (physician) and Advanced Practice Providers (APPs -  Physician Assistants and Nurse Practitioners) who all work together to provide you with the care you need, when you need it.  We recommend signing up for the patient portal called "MyChart".  Sign up information is provided on this After Visit Summary.  MyChart is used to connect with patients for Virtual Visits (Telemedicine).  Patients are able to view lab/test results, encounter notes, upcoming appointments, etc.  Non-urgent messages can be sent to your provider as well.   To learn more about what you can do with MyChart, go to [email or web address].    Your next appointment:   12 month(s)  The format for your next appointment:   Either In Person or Virtual  Provider:   You may see Kirk Ruths MD or one of the following Advanced Practice Providers on your designated Care Team:    Kerin Ransom, PA-C  [place], [place]  Coletta Memos, [place]

## 2020-05-26 ENCOUNTER — Ambulatory Visit (HOSPITAL_BASED_OUTPATIENT_CLINIC_OR_DEPARTMENT_OTHER)
Admission: RE | Admit: 2020-05-26 | Discharge: 2020-05-26 | Disposition: A | Discharge status: Home / Self Care | Payer: Medicare Other | Source: Ambulatory Visit | Attending: Internal Medicine | Admitting: Internal Medicine

## 2020-05-26 ENCOUNTER — Encounter: Payer: Self-pay | Admitting: Internal Medicine

## 2020-05-26 ENCOUNTER — Other Ambulatory Visit: Payer: Self-pay

## 2020-05-26 ENCOUNTER — Ambulatory Visit (INDEPENDENT_AMBULATORY_CARE_PROVIDER_SITE_OTHER): Payer: Medicare Other | Admitting: Internal Medicine

## 2020-05-26 VITALS — BP 157/73 | HR 66 | Temp 96.6°F | Resp 18 | Ht 65.0 in | Wt 135.5 lb

## 2020-05-26 DIAGNOSIS — J329 Chronic sinusitis, unspecified: Secondary | ICD-10-CM | POA: Diagnosis not present

## 2020-05-26 DIAGNOSIS — N61 Mastitis without abscess: Secondary | ICD-10-CM

## 2020-05-26 DIAGNOSIS — R509 Fever, unspecified: Secondary | ICD-10-CM | POA: Diagnosis not present

## 2020-05-26 DIAGNOSIS — R05 Cough: Secondary | ICD-10-CM

## 2020-05-26 DIAGNOSIS — R059 Cough, unspecified: Secondary | ICD-10-CM

## 2020-05-26 NOTE — Progress Notes (Signed)
Subjective:    Patient ID: [name], female    DOB: May 13, 1946, 74 y.o.   MRN: [id number]  DOS:  05/26/2020 Type of visit - description: Follow-up See last visit. Here for a follow-up of multiple issues. In general feels much better. Still has sinus congestion but not nearly as severe as before.   Review of Systems See above  Past Medical History:  Diagnosis Date  . Allergy   . Anemia   . Anxiety    past hx   . Arthritis   . Asthma   . Atrial flutter ([place])    past history- not current  . CAD (coronary artery disease) CARDIOLOGIST--  DR Angelena Form   mild non-obstructive cad  . Cancer ([place])    right  . Cataract    bilaterally removed   . Chronic constipation   . Chronic kidney disease    interstitial cystitis  . COPD (chronic obstructive pulmonary disease) ([place])   . Depression    past hx   . Family history of malignant hyperthermia    father had this  . Fibromyalgia   . Frequency of urination   . GERD (gastroesophageal reflux disease)   . H/O hiatal hernia   . History of basal cell carcinoma excision    X2  . History of breast cancer ONCOLOGIST-- DR Jana Hakim---  NO RECURRANCE   DX 07/2012;  LOW GRADE DCIS  ER+PR+  ----  S/P RIGHT LUMPECTOMY WITH NEGATIVE MARGINS/   RADIATION ENDED 11/2012  . History of chronic bronchitis   . History of colonic polyps   . History of tachycardia    CONTROLLED  WITH ATENOLOL  . Hyperlipidemia   . Hypertension   . Neuromuscular disorder (HCC)    fibromyalgia  . Osteoporosis 01/2019   T score -2.2 stable/improved from prior study  . Pelvic pain   . Personal history of radiation therapy   . S/P radiation therapy 11/12/12 - 12/05/12   right Breast  . Sepsis ([place]) 2014   from UTI   . Sinus headache   . Urgency of urination     Past Surgical History:  Procedure Laterality Date  . [place] STUDY N/A 04/03/2016   Procedure: [place] STUDY;  Surgeon: Manus Gunning, MD;  Location: [facility];  Service:  Gastroenterology;  Laterality: N/A;  . BREAST LUMPECTOMY Right 10-11-2012   W/ SLN BX  . CARDIAC CATHETERIZATION  09-13-2007  DR Lia Foyer   WELL-PRESERVED LVF/  DIFFUSE SCATTERED CORONARY CALCIFACATION AND ATHEROSCLEROSIS WITHOUT OBSTRUCTION  . CARDIAC CATHETERIZATION  08-04-2010  DR Angelena Form   NON-OBSTRUCTIVE CAD/  pLAD 40%/  oLAD 30%/  mLAD 30%/  pRCA 30%/  EF 60%  . CARDIOVASCULAR STRESS TEST  06-18-2012  DR McALHANY   LOW RISK NUCLEAR STUDY/  SMALL FIXED AREA OF MODERATELY DECREASED UPTAKE IN ANTEROSEPTAL WALL WHICH MAY BE ARTIFACTUAL/  NO ISCHEMIA/  EF 68%  . COLONOSCOPY  09-29-2010  . CYSTOSCOPY    . CYSTOSCOPY WITH HYDRODISTENSION AND BIOPSY N/A 03/06/2014   Procedure: CYSTOSCOPY/HYDRODISTENSION/ INSTILATION OF MARCAINE AND PYRIDIUM;  Surgeon: Ailene Rud, MD;  [facility];  Service: Urology;  Laterality: N/A;  . ESOPHAGEAL MANOMETRY N/A 04/03/2016   Procedure: ESOPHAGEAL MANOMETRY (EM);  Surgeon: Manus Gunning, MD;  Location: [facility];  Service: Gastroenterology;  Laterality: N/A;  . EXTRACORPOREAL SHOCK WAVE LITHOTRIPSY Left 02/06/2019   Procedure: EXTRACORPOREAL SHOCK WAVE LITHOTRIPSY (ESWL);  Surgeon: Kathie Rhodes, MD;  Location: [facility];  Service: Urology;  Laterality: Left;  . NASAL SINUS SURGERY  1985  . ORIF RIGHT ANKLE  FX  2006  . POLYPECTOMY    . REMOVAL VOCAL CORD CYST  FEB 2014  . RIGHT BREAST BX  08-23-2012  . RIGHT HAND SURGERY  X3  LAST ONE 2009   INCLUDES  ORIF RIGHT 5TH FINGER AND REVISION TWICE  . SKIN CANCER EXCISION    . TONSILLECTOMY AND ADENOIDECTOMY  AGE 26  . TOTAL ABDOMINAL HYSTERECTOMY W/ BILATERAL SALPINGOOPHORECTOMY  1982   W/  APPENDECTOMY  . TRANSTHORACIC ECHOCARDIOGRAM  06-24-2012   GRADE I DIASTOLIC DYSFUNCTION/  EF 55-60%/  MILD MR  . UPPER GASTROINTESTINAL ENDOSCOPY      Social History   Socioeconomic History  . Marital status: Married    Spouse name: Jori Moll   . Number of children: 3  . Years of  education: BA  . Highest education level: Not on file  Occupational History  . Occupation: Retired Programmer, multimedia: RETIRED  Tobacco Use  . Smoking status: Former Smoker    Packs/day: 1.00    Years: 15.00    Pack years: 15.00    Types: Cigarettes    Quit date: 05/27/2005    Years since quitting: 15.0  . Smokeless tobacco: Never Used  Substance and Sexual Activity  . Alcohol use: No    Alcohol/week: 0.0 standard drinks  . Drug use: No  . Sexual activity: Yes    Partners: Male    Comment: 1st intercourse 74 yo-Fewer than 5 partners  Other Topics Concern  . Not on file  Social History Narrative   Lives with husband.   Caffeine use: 1/2 cup per day   Exercise-- 2days a week [employer],  Water aerobics, walking       Retired Marine scientist   No dietary restrictions, tries to maintain a heart healthy diet   Social Determinants of Radio broadcast assistant Strain: Low Risk   . Difficulty of Paying Living Expenses: Not hard at all  Food Insecurity: No Food Insecurity  . Worried About Charity fundraiser in the Last Year: Never true  . Ran Out of Food in the Last Year: Never true  Transportation Needs: No Transportation Needs  . Lack of Transportation (Medical): No  . Lack of Transportation (Non-Medical): No  Physical Activity:   . Days of Exercise per Week:   . Minutes of Exercise per Session:   Stress:   . Feeling of Stress :   Social Connections:   . Frequency of Communication with Friends and Family:   . Frequency of Social Gatherings with Friends and Family:   . Attends Religious Services:   . Active Member of Clubs or Organizations:   . Attends Archivist Meetings:   Marland Kitchen Marital Status:   Intimate Partner Violence:   . Fear of Current or Ex-Partner:   . Emotionally Abused:   Marland Kitchen Physically Abused:   . Sexually Abused:       Allergies as of 05/26/2020      Reactions   Clindamycin Hcl Shortness Of Breath, Rash   Penicillins Anaphylaxis   Prednisone Shortness Of Breath,  Rash   Rosuvastatin Anaphylaxis   Baclofen Other (See Comments), Rash   Cortisone Other (See Comments)   Lincomycin Other (See Comments)   Codeine Hives, Other (See Comments)   headache   Erythromycin Base Other (See Comments)   other   Fluzone [flu Virus Vaccine] Other (See Comments)  Local reaction at the site   Haemophilus Influenzae Other (See Comments)   Local reaction at the site Local reaction at the site   Latex Hives   Pentazocine Lactate Other (See Comments)   HALLUCINATION   Pneumococcal Vaccine Polyvalent Hives, Swelling, Other (See Comments)   REACTION: redness, swelling, and hives at injection site   Tamoxifen Nausea And Vomiting, Other (See Comments)   HEADACHE      Medication List       Accurate as of May 26, 2020  4:54 PM. If you have any questions, ask your nurse or doctor.        acetaminophen 500 MG tablet Commonly known as: TYLENOL Take 1,000 mg by mouth every 6 (six) hours as needed for mild pain or headache.   albuterol 108 (90 Base) MCG/ACT inhaler Commonly known as: VENTOLIN HFA Inhale 2 puffs into the lungs every 4 (four) hours as needed.   amLODipine 10 MG tablet Commonly known as: NORVASC Take 1 tablet (10 mg total) by mouth daily.   aspirin EC 81 MG tablet Take 1 tablet (81 mg total) by mouth daily.   atenolol 50 MG tablet Commonly known as: TENORMIN Take 1 tablet (50 mg total) by mouth 2 (two) times daily.   DULoxetine 60 MG capsule Commonly known as: CYMBALTA Take 2 capsules by mouth once daily   Estradiol 10 MCG Tabs vaginal tablet Place 1 tablet (10 mcg total) vaginally 2 (two) times a week.   fluticasone 50 MCG/ACT nasal spray Commonly known as: FLONASE Place 2 sprays into both nostrils daily.   furosemide 20 MG tablet Commonly known as: LASIX Take 1 tablet (20 mg total) by mouth as needed for edema.   gabapentin 300 MG capsule Commonly known as: NEURONTIN Take 300 mg by mouth. 1cap in the morning, 1cap at noon, and  2caps at bedtime   hyoscyamine 0.125 MG SL tablet Commonly known as: LEVSIN SL Place 1 tablet (0.125 mg total) under the tongue every 4 (four) hours as needed.   linaclotide 145 MCG Caps capsule Commonly known as: Linzess Take 1 capsule (145 mcg total) by mouth daily before breakfast.   loratadine 10 MG tablet Commonly known as: CLARITIN Take 1 tablet (10 mg total) by mouth daily.   losartan 100 MG tablet Commonly known as: COZAAR Take 1 tablet by mouth once daily   mupirocin ointment 2 % Commonly known as: Bactroban Apply thin film twice daily.   nitroGLYCERIN 0.4 MG SL tablet Commonly known as: NITROSTAT Place 1 tablet (0.4 mg total) under the tongue every 5 (five) minutes as needed for chest pain.   omeprazole 40 MG capsule Commonly known as: PRILOSEC Take 1 capsule by mouth twice daily   pentosan polysulfate 100 MG capsule Commonly known as: Elmiron Take 1 capsule (100 mg total) by mouth 3 (three) times daily. Reported on 01/05/2016 What changed:   when to take this  reasons to take this   pramipexole 0.125 MG tablet Commonly known as: Mirapex Take 1-2 tablets (0.125-0.25 mg total) by mouth 3 (three) times daily.   Prolia 60 MG/ML Sosy injection Generic drug: denosumab Inject 60 mg into the skin every 6 (six) months.   Repatha SureClick [id number] MG/ML Soaj Generic drug: Evolocumab INJECT 140 MG SUBCUTANEOUSLY EVERY 14 DAYS   rOPINIRole 0.25 MG tablet Commonly known as: REQUIP Take 1 tablet (0.25 mg total) by mouth 3 (three) times daily.   traZODone 100 MG tablet Commonly known as: DESYREL TAKE 1 TABLET BY MOUTH  AT BEDTIME          Objective:   Physical Exam BP (!) 157/73 (BP Location: Left Arm, Patient Position: Sitting, Cuff Size: Small)   Pulse 66   Temp (!) 96.6 F (35.9 C) (Temporal)   Resp 18   Ht 5\' 5"  (1.651 m)   Wt 135 lb 8 oz (61.5 kg)   SpO2 100%   BMI 22.55 kg/m  General:   Well developed, NAD, BMI noted. HEENT:  Normocephalic .  Face symmetric, atraumatic. Nose is not congested, sinuses TTP more so on the right. Breast exam: Both breasts are tender (at baseline), skin with no redness at all. Lungs:  CTA B Normal respiratory effort, no intercostal retractions, no accessory muscle use. Heart: RRR,  no murmur.  Lower extremities: no pretibial edema bilaterally  Skin: Not pale. Not jaundice Neurologic:  alert & oriented X3.  Speech normal, gait appropriate for age and unassisted Psych--  Cognition and judgment appear intact.  Cooperative with normal attention span and concentration.  Behavior appropriate. No anxious or depressed appearing.      Assessment      74 year old female, PMH includes CAD, HTN, asthma, Breast cancer, s/p R breast Bx and lumpectomy 2013, noninvasive, estrogen positive, XRT completed 12/05/2012, on multiple medications, present with   Breast cellulitis: Resolved, recommend self breast exam and call if symptoms resurface Sinusitis: Improved, status post antibiotic, still tender to palpation, plans to proceed with a CAT scan of the sinuses in the next couple of days. Subjective fever, feeling awful, cough: See last visit, Covid testing and labs were normal, chest x-ray negative. Overall feels better. Follow-up with PCP as previously recommended by Dr. Charlett Blake       This visit occurred during the SARS-CoV-2 public health emergency.  Safety protocols were in place, including screening questions prior to the visit, additional usage of staff PPE, and extensive cleaning of exam room while observing appropriate contact time as indicated for disinfecting solutions.

## 2020-05-26 NOTE — Patient Instructions (Signed)
Please call if your symptoms resurface  Please see  your primary doctor when your follow-up is due

## 2020-05-26 NOTE — Progress Notes (Signed)
Pre visit review using our clinic review tool, if applicable. No additional management support is needed unless otherwise documented below in the visit note. 

## 2020-05-27 ENCOUNTER — Other Ambulatory Visit: Payer: Self-pay | Admitting: Family Medicine

## 2020-05-27 ENCOUNTER — Encounter: Payer: Self-pay | Admitting: Internal Medicine

## 2020-05-31 ENCOUNTER — Other Ambulatory Visit: Payer: Self-pay | Admitting: Family Medicine

## 2020-05-31 NOTE — Telephone Encounter (Signed)
Medication: DULoxetine (CYMBALTA) 60 MG capsule      Has the patient contacted their pharmacy? Yes  (If no, request that the patient contact the pharmacy for the refill.) (If yes, when and what did the pharmacy advise?) waiting on doctor responses     Preferred Pharmacy (with phone number or street name): [facility] - [place], [place] - [place]  [address], [place] [address]  Phone:  [phone] Fax:  [phone]     Agent: Please be advised that RX refills may take up to 3 business days. We ask that you follow-up with your pharmacy.

## 2020-06-01 ENCOUNTER — Ambulatory Visit: Payer: Medicare Other | Admitting: Physical Therapy

## 2020-06-08 DIAGNOSIS — G894 Chronic pain syndrome: Secondary | ICD-10-CM | POA: Diagnosis not present

## 2020-06-08 DIAGNOSIS — L0390 Cellulitis, unspecified: Secondary | ICD-10-CM | POA: Diagnosis not present

## 2020-06-08 DIAGNOSIS — R519 Headache, unspecified: Secondary | ICD-10-CM | POA: Diagnosis not present

## 2020-06-10 ENCOUNTER — Ambulatory Visit: Payer: Medicare Other | Admitting: Rheumatology

## 2020-06-10 ENCOUNTER — Ambulatory Visit (INDEPENDENT_AMBULATORY_CARE_PROVIDER_SITE_OTHER): Payer: Medicare Other | Admitting: Family Medicine

## 2020-06-10 ENCOUNTER — Other Ambulatory Visit: Payer: Self-pay

## 2020-06-10 ENCOUNTER — Ambulatory Visit (HOSPITAL_BASED_OUTPATIENT_CLINIC_OR_DEPARTMENT_OTHER)
Admission: RE | Admit: 2020-06-10 | Discharge: 2020-06-10 | Disposition: A | Discharge status: Home / Self Care | Payer: Medicare Other | Source: Ambulatory Visit | Attending: Family Medicine | Admitting: Family Medicine

## 2020-06-10 VITALS — BP 110/70 | HR 71 | Temp 98.0°F | Resp 12 | Ht 65.0 in | Wt 135.6 lb

## 2020-06-10 DIAGNOSIS — R05 Cough: Secondary | ICD-10-CM

## 2020-06-10 DIAGNOSIS — R059 Cough, unspecified: Secondary | ICD-10-CM

## 2020-06-10 DIAGNOSIS — N289 Disorder of kidney and ureter, unspecified: Secondary | ICD-10-CM | POA: Diagnosis not present

## 2020-06-10 DIAGNOSIS — E785 Hyperlipidemia, unspecified: Secondary | ICD-10-CM

## 2020-06-10 DIAGNOSIS — J449 Chronic obstructive pulmonary disease, unspecified: Secondary | ICD-10-CM

## 2020-06-10 DIAGNOSIS — G4733 Obstructive sleep apnea (adult) (pediatric): Secondary | ICD-10-CM | POA: Insufficient documentation

## 2020-06-10 DIAGNOSIS — M2550 Pain in unspecified joint: Secondary | ICD-10-CM | POA: Diagnosis not present

## 2020-06-10 DIAGNOSIS — Z170 Estrogen receptor positive status [ER+]: Secondary | ICD-10-CM

## 2020-06-10 DIAGNOSIS — R609 Edema, unspecified: Secondary | ICD-10-CM

## 2020-06-10 DIAGNOSIS — I10 Essential (primary) hypertension: Secondary | ICD-10-CM

## 2020-06-10 DIAGNOSIS — C50511 Malignant neoplasm of lower-outer quadrant of right female breast: Secondary | ICD-10-CM

## 2020-06-10 DIAGNOSIS — T43205A Adverse effect of unspecified antidepressants, initial encounter: Secondary | ICD-10-CM | POA: Insufficient documentation

## 2020-06-10 HISTORY — DX: Obstructive sleep apnea (adult) (pediatric): G47.33

## 2020-06-10 HISTORY — DX: Obstructive sleep apnea (adult) (pediatric): J44.9

## 2020-06-10 MED ORDER — BENZONATATE 100 MG PO CAPS: 100.0000 mg | ORAL_CAPSULE | Freq: Two times a day (BID) | ORAL | 0 refills | Status: DC | PRN

## 2020-06-10 NOTE — Assessment & Plan Note (Signed)
May use lasix daily prn if hydrating well. Wear compression hose and elevvate feet above heart tid

## 2020-06-10 NOTE — Progress Notes (Signed)
Subjective:    Patient ID: [name], female    DOB: 11/11/1946, 74 y.o.   MRN: [id number]  Chief Complaint  Patient presents with  . Follow-up    HPI Patient is in today for follow up on chronic medical concerns. She is still struggling with an cough. Occurs day and night. Is not productive but can take her breath. No c/o fevers or chills. Denies CP/palp/SOB/HA/fevers/GI or GU c/o. Taking meds as prescribed. The skin infection on her right breast has improved.   Past Medical History:  Diagnosis Date  . Allergy   . Anemia   . Anxiety    past hx   . Arthritis   . Asthma   . Atrial flutter ([place])    past history- not current  . CAD (coronary artery disease) CARDIOLOGIST--  DR Angelena Form   mild non-obstructive cad  . Cancer ([place])    right  . Cataract    bilaterally removed   . Chronic constipation   . Chronic kidney disease    interstitial cystitis  . COPD (chronic obstructive pulmonary disease) ([place])   . Depression    past hx   . Family history of malignant hyperthermia    father had this  . Fibromyalgia   . Frequency of urination   . GERD (gastroesophageal reflux disease)   . H/O hiatal hernia   . History of basal cell carcinoma excision    X2  . History of breast cancer ONCOLOGIST-- DR Jana Hakim---  NO RECURRANCE   DX 07/2012;  LOW GRADE DCIS  ER+PR+  ----  S/P RIGHT LUMPECTOMY WITH NEGATIVE MARGINS/   RADIATION ENDED 11/2012  . History of chronic bronchitis   . History of colonic polyps   . History of tachycardia    CONTROLLED  WITH ATENOLOL  . Hyperlipidemia   . Hypertension   . Neuromuscular disorder (HCC)    fibromyalgia  . Osteoporosis 01/2019   T score -2.2 stable/improved from prior study  . Pelvic pain   . Personal history of radiation therapy   . S/P radiation therapy 11/12/12 - 12/05/12   right Breast  . Sepsis ([place]) 2014   from UTI   . Sinus headache   . Urgency of urination     Past Surgical History:  Procedure Laterality Date  . [place] STUDY N/A 04/03/2016   Procedure: [place] STUDY;  Surgeon: Manus Gunning, MD;  Location: [facility];  Service: Gastroenterology;  Laterality: N/A;  . BREAST LUMPECTOMY Right 10-11-2012   W/ SLN BX  . CARDIAC CATHETERIZATION  09-13-2007  DR Lia Foyer   WELL-PRESERVED LVF/  DIFFUSE SCATTERED CORONARY CALCIFACATION AND ATHEROSCLEROSIS WITHOUT OBSTRUCTION  . CARDIAC CATHETERIZATION  08-04-2010  DR Angelena Form   NON-OBSTRUCTIVE CAD/  pLAD 40%/  oLAD 30%/  mLAD 30%/  pRCA 30%/  EF 60%  . CARDIOVASCULAR STRESS TEST  06-18-2012  DR McALHANY   LOW RISK NUCLEAR STUDY/  SMALL FIXED AREA OF MODERATELY DECREASED UPTAKE IN ANTEROSEPTAL WALL WHICH MAY BE ARTIFACTUAL/  NO ISCHEMIA/  EF 68%  . COLONOSCOPY  09-29-2010  . CYSTOSCOPY    . CYSTOSCOPY WITH HYDRODISTENSION AND BIOPSY N/A 03/06/2014   Procedure: CYSTOSCOPY/HYDRODISTENSION/ INSTILATION OF MARCAINE AND PYRIDIUM;  Surgeon: Ailene Rud, MD;  Location: [facility];  Service: Urology;  Laterality: N/A;  . ESOPHAGEAL MANOMETRY N/A 04/03/2016   Procedure: ESOPHAGEAL MANOMETRY (EM);  Surgeon: Manus Gunning, MD;  Location: [facility];  Service: Gastroenterology;  Laterality: N/A;  .  EXTRACORPOREAL SHOCK WAVE LITHOTRIPSY Left 02/06/2019   Procedure: EXTRACORPOREAL SHOCK WAVE LITHOTRIPSY (ESWL);  Surgeon: Kathie Rhodes, MD;  Location: [facility];  Service: Urology;  Laterality: Left;  . NASAL SINUS SURGERY  1985  . ORIF RIGHT ANKLE  FX  2006  . POLYPECTOMY    . REMOVAL VOCAL CORD CYST  FEB 2014  . RIGHT BREAST BX  08-23-2012  . RIGHT HAND SURGERY  X3  LAST ONE 2009   INCLUDES  ORIF RIGHT 5TH FINGER AND REVISION TWICE  . SKIN CANCER EXCISION    . TONSILLECTOMY AND ADENOIDECTOMY  AGE 39  . TOTAL ABDOMINAL HYSTERECTOMY W/ BILATERAL SALPINGOOPHORECTOMY  1982   W/  APPENDECTOMY  . TRANSTHORACIC ECHOCARDIOGRAM  06-24-2012   GRADE I DIASTOLIC DYSFUNCTION/  EF 55-60%/  MILD MR  . UPPER GASTROINTESTINAL ENDOSCOPY       Family History  Problem Relation Age of Onset  . Rectal cancer Mother   . Colon cancer Mother   . Pancreatic cancer Mother   . Diabetes Mother   . Breast cancer Mother 56  . Breast cancer Maternal Aunt        breast  . Irritable bowel syndrome Son   . Heart disease Son        CAD, MV replacement  . Allergic Disorder Daughter   . Colon cancer Father   . Colon polyps Father   . Diabetes Father   . Stroke Father   . Heart disease Father        CHF  . Hyperlipidemia Father   . Hypertension Father   . Arthritis Father   . Breast cancer Paternal Aunt   . Breast cancer Paternal Aunt   . Arthritis Paternal Uncle   . Allergic Disorder Daughter   . Esophageal cancer Neg Hx   . Stomach cancer Neg Hx     Social History   Socioeconomic History  . Marital status: Married    Spouse name: Jori Moll   . Number of children: 3  . Years of education: BA  . Highest education level: Not on file  Occupational History  . Occupation: Retired Programmer, multimedia: RETIRED  Tobacco Use  . Smoking status: Former Smoker    Packs/day: 1.00    Years: 15.00    Pack years: 15.00    Types: Cigarettes    Quit date: 05/27/2005    Years since quitting: 15.0  . Smokeless tobacco: Never Used  Vaping Use  . Vaping Use: Never used  Substance and Sexual Activity  . Alcohol use: No    Alcohol/week: 0.0 standard drinks  . Drug use: No  . Sexual activity: Yes    Partners: Male    Comment: 1st intercourse 74 yo-Fewer than 5 partners  Other Topics Concern  . Not on file  Social History Narrative   Lives with husband.   Caffeine use: 1/2 cup per day   Exercise-- 2days a week [employer],  Water aerobics, walking       Retired Marine scientist   No dietary restrictions, tries to maintain a heart healthy diet   Social Determinants of Radio broadcast assistant Strain: Low Risk   . Difficulty of Paying Living Expenses: Not hard at all  Food Insecurity: No Food Insecurity  . Worried About Charity fundraiser in the  Last Year: Never true  . Ran Out of Food in the Last Year: Never true  Transportation Needs: No Transportation Needs  . Lack of Transportation (Medical): No  .  Lack of Transportation (Non-Medical): No  Physical Activity:   . Days of Exercise per Week:   . Minutes of Exercise per Session:   Stress:   . Feeling of Stress :   Social Connections:   . Frequency of Communication with Friends and Family:   . Frequency of Social Gatherings with Friends and Family:   . Attends Religious Services:   . Active Member of Clubs or Organizations:   . Attends Archivist Meetings:   Marland Kitchen Marital Status:   Intimate Partner Violence:   . Fear of Current or Ex-Partner:   . Emotionally Abused:   Marland Kitchen Physically Abused:   . Sexually Abused:     Outpatient Medications Prior to Visit  Medication Sig Dispense Refill  . acetaminophen (TYLENOL) 500 MG tablet Take 1,000 mg by mouth every 6 (six) hours as needed for mild pain or headache.     . albuterol (PROVENTIL HFA;VENTOLIN HFA) 108 (90 BASE) MCG/ACT inhaler Inhale 2 puffs into the lungs every 4 (four) hours as needed. 1 Inhaler 5  . amLODipine (NORVASC) 10 MG tablet Take 1 tablet (10 mg total) by mouth daily. 90 tablet 3  . aspirin EC 81 MG tablet Take 1 tablet (81 mg total) by mouth daily. 90 tablet 3  . atenolol (TENORMIN) 50 MG tablet Take 1 tablet (50 mg total) by mouth 2 (two) times daily. 180 tablet 1  . denosumab (PROLIA) 60 MG/ML SOSY injection Inject 60 mg into the skin every 6 (six) months.    . DULoxetine (CYMBALTA) 60 MG capsule Take 2 capsules by mouth once daily 90 capsule 0  . Estradiol 10 MCG TABS vaginal tablet Place 1 tablet (10 mcg total) vaginally 2 (two) times a week. 24 tablet 4  . fluticasone (FLONASE) 50 MCG/ACT nasal spray Place 2 sprays into both nostrils daily. 16 g 5  . furosemide (LASIX) 20 MG tablet Take 1 tablet (20 mg total) by mouth as needed for edema. 90 tablet 3  . gabapentin (NEURONTIN) 300 MG capsule Take 300 mg  by mouth. 1cap in the morning, 1cap at noon, and 2caps at bedtime    . hyoscyamine (LEVSIN SL) 0.125 MG SL tablet Place 1 tablet (0.125 mg total) under the tongue every 4 (four) hours as needed. 30 tablet 0  . linaclotide (LINZESS) 145 MCG CAPS capsule Take 1 capsule (145 mcg total) by mouth daily before breakfast. 30 capsule 5  . loratadine (CLARITIN) 10 MG tablet Take 1 tablet (10 mg total) by mouth daily. 30 tablet 11  . losartan (COZAAR) 100 MG tablet Take 1 tablet by mouth once daily 90 tablet 2  . mupirocin ointment (BACTROBAN) 2 % Apply thin film twice daily. 22 g 0  . nitroGLYCERIN (NITROSTAT) 0.4 MG SL tablet Place 1 tablet (0.4 mg total) under the tongue every 5 (five) minutes as needed for chest pain. 25 tablet 1  . omeprazole (PRILOSEC) 40 MG capsule Take 1 capsule by mouth twice daily 90 capsule 0  . pentosan polysulfate (ELMIRON) 100 MG capsule Take 1 capsule (100 mg total) by mouth 3 (three) times daily. Reported on 01/05/2016 (Patient taking differently: Take 100 mg by mouth 3 (three) times daily with meals as needed. Reported on 01/05/2016) 90 capsule 11  . pramipexole (MIRAPEX) 0.125 MG tablet Take 1-2 tablets (0.125-0.25 mg total) by mouth 3 (three) times daily. 60 tablet 1  . REPATHA SURECLICK 366 MG/ML SOAJ INJECT 140 MG SUBCUTANEOUSLY EVERY 14 DAYS 2 pen 11  . rOPINIRole (  REQUIP) 0.25 MG tablet Take 1 tablet (0.25 mg total) by mouth 3 (three) times daily. 30 tablet 5  . traZODone (DESYREL) 100 MG tablet TAKE 1 TABLET BY MOUTH AT BEDTIME 90 tablet 0   Facility-Administered Medications Prior to Visit  Medication Dose Route Frequency Provider Last Rate Last Admin  . bupivacaine (MARCAINE) 0.5 % 10 mL, triamcinolone acetonide (KENALOG-40) 40 mg injection   Subcutaneous Once Jethro Bolus, MD      . bupivacaine (MARCAINE) 0.5 % 15 mL, phenazopyridine (PYRIDIUM) 400 mg bladder mixture   Bladder Instillation Once Jethro Bolus, MD        Allergies  Allergen Reactions  .  Clindamycin Hcl Shortness Of Breath and Rash  . Penicillins Anaphylaxis  . Prednisone Shortness Of Breath and Rash  . Rosuvastatin Anaphylaxis  . Baclofen Other (See Comments) and Rash  . Cortisone Other (See Comments)  . Lincomycin Other (See Comments)  . Codeine Hives and Other (See Comments)    headache  . Erythromycin Base Other (See Comments)    other  . Fluzone [Flu Virus Vaccine] Other (See Comments)    Local reaction at the site  . Haemophilus Influenzae Other (See Comments)    Local reaction at the site Local reaction at the site  . Latex Hives  . Pentazocine Lactate Other (See Comments)    HALLUCINATION  . Pneumococcal Vaccine Polyvalent Hives, Swelling and Other (See Comments)    REACTION: redness, swelling, and hives at injection site  . Tamoxifen Nausea And Vomiting and Other (See Comments)    HEADACHE    Review of Systems  Constitutional: Positive for malaise/fatigue. Negative for fever.  HENT: Positive for congestion.   Eyes: Negative for blurred vision.  Respiratory: Positive for cough. Negative for shortness of breath.   Cardiovascular: Negative for chest pain, palpitations and leg swelling.  Gastrointestinal: Negative for abdominal pain, blood in stool and nausea.  Genitourinary: Negative for dysuria and frequency.  Musculoskeletal: Negative for falls.  Skin: Negative for rash.  Neurological: Negative for dizziness, loss of consciousness and headaches.  Endo/Heme/Allergies: Negative for environmental allergies.  Psychiatric/Behavioral: Negative for depression. The patient is not nervous/anxious.        Objective:    Physical Exam Vitals and nursing note reviewed.  Constitutional:      General: She is not in acute distress.    Appearance: She is well-developed.  HENT:     Head: Normocephalic and atraumatic.     Nose: Nose normal.  Eyes:     General:        Right eye: No discharge.        Left eye: No discharge.  Cardiovascular:     Rate and  Rhythm: Normal rate and regular rhythm.     Heart sounds: No murmur heard.   Pulmonary:     Effort: Pulmonary effort is normal.     Breath sounds: Normal breath sounds.  Abdominal:     General: Bowel sounds are normal.     Palpations: Abdomen is soft.     Tenderness: There is no abdominal tenderness.  Musculoskeletal:     Cervical back: Normal range of motion and neck supple.  Skin:    General: Skin is warm and dry.  Neurological:     Mental Status: She is alert and oriented to person, place, and time.     BP 110/70 (BP Location: Left Arm, Cuff Size: Normal)   Pulse 71   Temp 98 F (36.7 C) (Temporal)   Resp  12   Ht 5\' 5"  (1.651 m)   Wt 135 lb 9.6 oz (61.5 kg)   SpO2 96%   BMI 22.57 kg/m  Wt Readings from Last 3 Encounters:  06/10/20 135 lb 9.6 oz (61.5 kg)  05/26/20 135 lb 8 oz (61.5 kg)  05/25/20 139 lb 3.2 oz (63.1 kg)    Diabetic Foot Exam - Simple   No data filed     Lab Results  Component Value Date   WBC 6.6 05/13/2020   HGB 12.6 05/13/2020   HCT 38.5 05/13/2020   PLT 259.0 05/13/2020   GLUCOSE 97 05/13/2020   CHOL 129 02/26/2020   TRIG 124.0 02/26/2020   HDL 44.40 02/26/2020   LDLDIRECT 117.3 04/08/2012   LDLCALC 60 02/26/2020   ALT 8 05/13/2020   AST 16 05/13/2020   NA 139 05/13/2020   K 3.7 05/13/2020   CL 106 05/13/2020   CREATININE 0.98 05/13/2020   BUN 14 05/13/2020   CO2 29 05/13/2020   TSH 1.76 02/26/2020   INR 1.05 08/03/2010   HGBA1C 5.8 03/01/2015   MICROALBUR 0.2 07/10/2013    Lab Results  Component Value Date   TSH 1.76 02/26/2020   Lab Results  Component Value Date   WBC 6.6 05/13/2020   HGB 12.6 05/13/2020   HCT 38.5 05/13/2020   MCV 93.7 05/13/2020   PLT 259.0 05/13/2020   Lab Results  Component Value Date   NA 139 05/13/2020   K 3.7 05/13/2020   CHLORIDE 102 04/20/2015   CO2 29 05/13/2020   GLUCOSE 97 05/13/2020   BUN 14 05/13/2020   CREATININE 0.98 05/13/2020   BILITOT 0.5 05/13/2020   ALKPHOS 80  05/13/2020   AST 16 05/13/2020   ALT 8 05/13/2020   PROT 6.7 05/13/2020   ALBUMIN 4.0 05/13/2020   CALCIUM 9.0 05/13/2020   ANIONGAP 12 (H) 04/20/2015   EGFR 49 (L) 04/20/2015   GFR 55.35 (L) 05/13/2020   Lab Results  Component Value Date   CHOL 129 02/26/2020   Lab Results  Component Value Date   HDL 44.40 02/26/2020   Lab Results  Component Value Date   LDLCALC 60 02/26/2020   Lab Results  Component Value Date   TRIG 124.0 02/26/2020   Lab Results  Component Value Date   CHOLHDL 3 02/26/2020   Lab Results  Component Value Date   HGBA1C 5.8 03/01/2015       Assessment & Plan:   Problem List Items Addressed This Visit    Essential hypertension    Well controlled, no changes to meds. Encouraged heart healthy diet such as the DASH diet and exercise as tolerated. Was up as high as 190 over 110 when she had cellulitis but it is much better.       Edema    May use lasix daily prn if hydrating well. Wear compression hose and elevvate feet above heart tid      Malignant neoplasm of lower-outer quadrant of right breast of female, estrogen receptor positive (HCC)   Arthralgia    She is doing better off of Tramadol only taking Tylenol in afternoon and is doing better.      Renal insufficiency    Hydrate and monitor      Hyperlipidemia    Encouraged heart healthy diet, increase exercise, avoid trans fats, consider a krill oil cap daily. Is statin intolerant      Cough - Primary    Persistent. Will repeat CXR and given Tessalon perles.  Report if no improvement.       Relevant Medications   benzonatate (TESSALON) 100 MG capsule   Other Relevant Orders   DG Chest 2 View   OSA and COPD overlap syndrome (HCC)   Relevant Medications   benzonatate (TESSALON) 100 MG capsule   Serotonin withdrawal syndrome without complication      I am having [name] start on benzonatate. I am also having her maintain her acetaminophen, aspirin EC, albuterol,  loratadine, pentosan polysulfate, denosumab, linaclotide, mupirocin ointment, nitroGLYCERIN, furosemide, fluticasone, rOPINIRole, pramipexole, Estradiol, losartan, hyoscyamine, traZODone, Repatha SureClick, gabapentin, atenolol, omeprazole, amLODipine, and DULoxetine.  Meds ordered this encounter  Medications  . benzonatate (TESSALON) 100 MG capsule    Sig: Take 1 capsule (100 mg total) by mouth 2 (two) times daily as needed for cough.    Dispense:  20 capsule    Refill:  0     [name], MD

## 2020-06-10 NOTE — Patient Instructions (Signed)

## 2020-06-10 NOTE — Assessment & Plan Note (Signed)
Hydrate and monitor 

## 2020-06-10 NOTE — Assessment & Plan Note (Signed)
Well controlled, no changes to meds. Encouraged heart healthy diet such as the DASH diet and exercise as tolerated. Was up as high as 190 over 110 when she had cellulitis but it is much better.

## 2020-06-10 NOTE — Assessment & Plan Note (Signed)
Encouraged heart healthy diet, increase exercise, avoid trans fats, consider a krill oil cap daily. Is statin intolerant

## 2020-06-10 NOTE — Assessment & Plan Note (Signed)
Persistent. Will repeat CXR and given Tessalon perles. Report if no improvement.

## 2020-06-10 NOTE — Assessment & Plan Note (Signed)
She is doing better off of Tramadol only taking Tylenol in afternoon and is doing better.

## 2020-06-14 ENCOUNTER — Ambulatory Visit: Payer: Medicare Other | Attending: Family Medicine

## 2020-06-14 ENCOUNTER — Other Ambulatory Visit: Payer: Self-pay

## 2020-06-14 DIAGNOSIS — M6281 Muscle weakness (generalized): Secondary | ICD-10-CM | POA: Diagnosis not present

## 2020-06-14 DIAGNOSIS — G8929 Other chronic pain: Secondary | ICD-10-CM

## 2020-06-14 DIAGNOSIS — R42 Dizziness and giddiness: Secondary | ICD-10-CM

## 2020-06-14 DIAGNOSIS — R296 Repeated falls: Secondary | ICD-10-CM | POA: Insufficient documentation

## 2020-06-14 DIAGNOSIS — R2689 Other abnormalities of gait and mobility: Secondary | ICD-10-CM | POA: Insufficient documentation

## 2020-06-14 DIAGNOSIS — M545 Low back pain, unspecified: Secondary | ICD-10-CM

## 2020-06-14 DIAGNOSIS — R2681 Unsteadiness on feet: Secondary | ICD-10-CM

## 2020-06-14 NOTE — Therapy (Signed)
[facility] [address]  [place], [place], [address] Phone: [phone]   Fax:  [phone]  Physical Therapy Treatment  Patient Details  Name: [name] MRN: [id number] Date of Birth: [birth year] Referring Provider (PT): [name], MD   Encounter Date: 06/14/2020   PT End of Session - 06/14/20 1048    Visit Number 2    Number of Visits 12    Date for PT Re-Evaluation 06/22/20    Authorization Type [employer]    PT Start Time 1020    PT Stop Time 1102    PT Time Calculation (min) 42 min    Activity Tolerance Patient tolerated treatment well;Patient limited by pain    Behavior During Therapy [facility] for tasks assessed/performed           Past Medical History:  Diagnosis Date   Allergy    Anemia    Anxiety    past hx    Arthritis    Asthma    Atrial flutter ([place])    past history- not current   CAD (coronary artery disease) CARDIOLOGIST--  DR Angelena Form   mild non-obstructive cad   Cancer ([place])    right   Cataract    bilaterally removed    Chronic constipation    Chronic kidney disease    interstitial cystitis   COPD (chronic obstructive pulmonary disease) ([place])    Depression    past hx    Family history of malignant hyperthermia    father had this   Fibromyalgia    Frequency of urination    GERD (gastroesophageal reflux disease)    H/O hiatal hernia    History of basal cell carcinoma excision    X2   History of breast cancer ONCOLOGIST-- DR Jana Hakim---  NO RECURRANCE   DX 07/2012;  LOW GRADE DCIS  ER+PR+  ----  S/P RIGHT LUMPECTOMY WITH NEGATIVE MARGINS/   RADIATION ENDED 11/2012   History of chronic bronchitis    History of colonic polyps    History of tachycardia    CONTROLLED  WITH ATENOLOL   Hyperlipidemia    Hypertension    Neuromuscular disorder ([place])    fibromyalgia   Osteoporosis 01/2019   T score -2.2 stable/improved from prior study   Pelvic  pain    Personal history of radiation therapy    S/P radiation therapy 11/12/12 - 12/05/12   right Breast   Sepsis ([place]) 2014   from UTI    Sinus headache    Urgency of urination     Past Surgical History:  Procedure Laterality Date   78 HOUR [place] STUDY N/A 04/03/2016   Procedure: 24 HOUR [place] STUDY;  Surgeon: Manus Gunning, MD;  Location: [facility];  Service: Gastroenterology;  Laterality: N/A;   BREAST LUMPECTOMY Right 10-11-2012   W/ SLN BX   CARDIAC CATHETERIZATION  09-13-2007  DR Lia Foyer   WELL-PRESERVED LVF/  DIFFUSE SCATTERED CORONARY CALCIFACATION AND ATHEROSCLEROSIS WITHOUT OBSTRUCTION   CARDIAC CATHETERIZATION  08-04-2010  DR MCALHANY   NON-OBSTRUCTIVE CAD/  pLAD 40%/  oLAD 30%/  mLAD 30%/  pRCA 30%/  EF 60%   CARDIOVASCULAR STRESS TEST  06-18-2012  DR McALHANY   LOW RISK NUCLEAR STUDY/  SMALL FIXED AREA OF MODERATELY DECREASED UPTAKE IN ANTEROSEPTAL WALL WHICH MAY BE ARTIFACTUAL/  NO ISCHEMIA/  EF 68%   COLONOSCOPY  09-29-2010   CYSTOSCOPY     CYSTOSCOPY WITH HYDRODISTENSION AND BIOPSY N/A 03/06/2014  Procedure: CYSTOSCOPY/HYDRODISTENSION/ INSTILATION OF MARCAINE AND PYRIDIUM;  Surgeon: Ailene Rud, MD;  Location: [facility];  Service: Urology;  Laterality: N/A;   ESOPHAGEAL MANOMETRY N/A 04/03/2016   Procedure: ESOPHAGEAL MANOMETRY (EM);  Surgeon: Manus Gunning, MD;  Location: [facility];  Service: Gastroenterology;  Laterality: N/A;   EXTRACORPOREAL SHOCK WAVE LITHOTRIPSY Left 02/06/2019   Procedure: EXTRACORPOREAL SHOCK WAVE LITHOTRIPSY (ESWL);  Surgeon: Kathie Rhodes, MD;  Location: [facility];  Service: Urology;  Laterality: Left;   NASAL SINUS SURGERY  1985   ORIF RIGHT ANKLE  FX  2006   POLYPECTOMY     REMOVAL VOCAL CORD CYST  FEB 2014   RIGHT BREAST BX  08-23-2012   RIGHT HAND SURGERY  X3  LAST ONE 2009   INCLUDES  ORIF RIGHT 5TH FINGER AND REVISION TWICE   SKIN CANCER EXCISION     TONSILLECTOMY AND  ADENOIDECTOMY  AGE 1   TOTAL ABDOMINAL HYSTERECTOMY W/ BILATERAL SALPINGOOPHORECTOMY  1982   W/  APPENDECTOMY   TRANSTHORACIC ECHOCARDIOGRAM  06-24-2012   GRADE I DIASTOLIC DYSFUNCTION/  EF 55-60%/  MILD MR   UPPER GASTROINTESTINAL ENDOSCOPY      There were no vitals filed for this visit.   Subjective Assessment - 06/14/20 1029    Subjective Pt reports she has been tested for COVID due to a persistent "crupey" cough, but she is negative and cleared for PT. She has been walkin gfor 0.5 miles most days and gets really tired from it and feels like she trips over her own feet. She reports she has neuropathy and is taking Gabapentin, but thinks maybe she is developing a tolerance. She did lifting at church in the nursery yesterday, so she is having more back pain today.    Pertinent History Recurrent falls - 2 in past 6 months (past 5 weeks), 6 major falls in past 5-6 yrs (2 concussions, finger fracture); costochondritis, cellulitis of R breast, fibromyalgia, chronic LBP since ~age 71, DDD, sciatica, osteoporosis, degenerative scoliosis, peripheral neuropathy, vertigo, R knee pain, CAD, COPD, HTN, h/o lumpectomy for R breast cancer 2013, ORIF R ankle s/p fall 2006    Limitations Standing;Walking    How long can you stand comfortably? 10-15 minutes - limited by back/sacral pain    How long can you walk comfortably? 1.5 blocks    Patient Stated Goals "Be able to gain some strength in my legs to help with my back. To be able stand w/o feeling like I'm swaying and walk w/o tripping."    Currently in Pain? Yes    Pain Score 7     Pain Location Back    Pain Orientation Lower    Pain Descriptors / Indicators Sore    Pain Type Acute pain;Chronic pain    Pain Onset Other (comment)   since ~age 55                            [facility] Adult PT Treatment/Exercise - 06/14/20 0001      Exercises   Exercises Knee/Hip      Knee/Hip Exercises: Seated   Sit to Sand 10 reps;with UE  support;Other (comment)   YTB above knees, max VCs     Knee/Hip Exercises: Supine   Other Supine Knee/Hip Exercises H/L clams with iso TrA YTB above knees 10 x 5"   D/C today due to LBP     Knee/Hip Exercises: Sidelying   Clams 1 x 10 x  5" YTB above knees                    PT Short Term Goals - 05/11/20 1026      PT SHORT TERM GOAL #1   Title Patient will be independent with initial HEP    Status New    Target Date 05/25/20      PT SHORT TERM GOAL #2   Title Complete FGA assessment    Status New    Target Date 05/25/20             PT Long Term Goals - 05/11/20 1232      PT LONG TERM GOAL #1   Title Patient will be independent with ongoing/advanced HEP    Status New    Target Date 06/22/20      PT LONG TERM GOAL #2   Title Patient will demonstrate improved B LE strength to >/= 4/5 to 4+/5 for improved stability and ease of mobility    Status New    Target Date 06/22/20      PT LONG TERM GOAL #3   Title Patient will improve Berg score to >/= 52/56 to improve safety stability with ADLs in standing and reduce risk for falls    Status New    Target Date 06/22/20      PT LONG TERM GOAL #4   Title Patient will report no further instances of falls    Status New    Target Date 06/22/20                 Plan - 06/14/20 1048    Clinical Impression Statement Pt presents to PT after prolonged absence. She has not had any further falls, but does report feeling like her feet hit each other when she ambulated 0.5 miles, which is also very fatiguing. Pt had low back pain with neutral spine hip abduction/ER in supine, as well as with pelvic tilts that decreased with S/L clams. Max cueing provided for proper posture and form with eccentric sit to stands. She will continue to benefit from progressions of LE strength. endurance, and balance.    Personal Factors and Comorbidities Comorbidity 3+;Age;Fitness;Past/Current Experience    Comorbidities Recurrent falls - 2  in past 6 months (past 5 weeks), 6 major falls in past 5-6 yrs (2 concussions, finger fracture); costochondritis, cellulitis of R breast, fibromyalgia, chronic LBP since ~age 41, DDD, sciatica, osteoporosis, degenerative scoliosis, peripheral neuropathy, vertigo, R knee pain, CAD, COPD, HTN, h/o lumpectomy for R breast cancer 2013, ORIF R ankle s/p fall 2006    Examination-Activity Limitations Bathing;Bend;Carry;Dressing;Lift;Locomotion Level;Stand    Examination-Participation Restrictions Church;Community Activity;Interpersonal Relationship;Laundry;Meal Prep;Shop;Yard Work    [employer] Evolving/Moderate complexity    Rehab Potential Good    PT Frequency 2x / week    PT Duration 6 weeks    PT Treatment/Interventions ADLs/Self Care Home Management;Cryotherapy;Electrical Stimulation;Iontophoresis 4mg /ml Dexamethasone;Moist Heat;Ultrasound;DME Instruction;Gait training;Stair training;Functional mobility training;Therapeutic activities;Therapeutic exercise;Balance training;Neuromuscular re-education;Patient/family education;Manual techniques;Passive range of motion;Dry needling;Taping;Vasopneumatic Device;Vestibular;Visual/perceptual remediation/compensation;Spinal Manipulations    PT Next Visit Plan Progress LE strength, postural alignment for functional strengthening, initiate balance    PT Home Exercise Plan see pt instructions    Consulted and Agree with Plan of Care Patient           Patient will benefit from skilled therapeutic intervention in order to improve the following deficits and impairments:  Abnormal gait, Decreased activity tolerance, Decreased balance, Decreased coordination, Decreased endurance, Decreased knowledge of precautions, Decreased  mobility, Decreased safety awareness, Decreased strength, Difficulty walking, Dizziness, Increased fascial restricitons, Increased muscle spasms, Impaired perceived functional ability, Impaired flexibility, Improper body  mechanics, Postural dysfunction, Pain  Visit Diagnosis: Repeated falls  Unsteadiness on feet  Muscle weakness (generalized)  Other abnormalities of gait and mobility  Dizziness and giddiness  Chronic bilateral low back pain without sciatica     Problem List Patient Active Problem List   Diagnosis Date Noted   OSA and COPD overlap syndrome ([place]) 06/10/2020   Serotonin withdrawal syndrome without complication 54/98/2641   Recurrent falls 05/02/2020   Statin intolerance 02/29/2020   RLS (restless legs syndrome) 11/09/2019   Kidney stone 02/26/2019   Recurrent sinusitis 02/25/2019   Hx of colonic polyp 02/25/2019   DDD (degenerative disc disease), cervical 09/17/2018   Degenerative scoliosis 04/22/2018   Primary insomnia 06/25/2017   Chronic interstitial cystitis 02/01/2017   Cough 01/09/2017   Right arm pain 07/20/2016   Deviated septum 05/12/2016   Nasal turbinate hypertrophy 05/12/2016   Dysphagia    Allergic rhinitis 01/25/2016   Other and combined forms of senile cataract 07/15/2015   Dyspnea    Left hip pain 04/30/2015   Sciatica 04/30/2015   Knee pain, right 04/30/2015   Bilateral carpal tunnel syndrome 03/04/2015   Hyperlipidemia 03/01/2015   Pulmonary nodules 02/12/2015   External hemorrhoids 02/05/2015   Chronic night sweats 01/08/2015   Atypical chest pain 01/01/2015   Renal insufficiency 07/16/2014   Memory loss 05/22/2014   Peripheral neuropathy 05/22/2014   Abdominal pain 10/26/2013   Headache 10/13/2013   Arthralgia 08/18/2013   ANA positive 07/29/2013   Depression 02/05/2013   Family history of malignant hyperthermia 02/05/2013   Malignant neoplasm of lower-outer quadrant of right breast of female, estrogen receptor positive ([place]) 01/03/2013   Splenic lesion 09/02/2012   Asthma, allergic 08/05/2012   GERD (gastroesophageal reflux disease) 06/03/2012   Edema 04/08/2012   Stricture and stenosis of  esophagus 10/19/2011   Functional constipation 07/21/2011   Nonspecific (abnormal) findings on radiological and other examination of biliary tract 07/21/2011   Osteoporosis 04/17/2011   Leg pain 02/24/2011   FIBROCYSTIC BREAST DISEASE, HX OF 10/18/2010   Essential hypertension 08/25/2010   CAD in native artery 08/25/2010   TOBACCO ABUSE, HX OF 08/25/2010   GENERALIZED ANXIETY DISORDER 08/03/2010   Fibromyalgia 01/11/2010    Izell [place], PT, DPT 06/14/2020, 12:11 PM  [facility] [address]  [place], [place], [address] Phone: [phone]   Fax:  [phone]  Name: [name] MRN: [id number] Date of Birth: 05-26-1946

## 2020-06-16 ENCOUNTER — Telehealth: Payer: Self-pay | Admitting: *Deleted

## 2020-06-16 NOTE — Telephone Encounter (Addendum)
Deductible N/A  OOP MAX [id number])  Annual exam 11/07/2019 TF  Calcium  9.0           Date 05/13/2020  Upcoming dental procedures   Prior Authorization needed  YES faxed information to [phone] per [employer] AWATING APPROVAL/DENIAL   Approved from 06/07/2020-6/25-2022  Pt estimated Cost $213   APPT 06/30/2020   Coverage Details: Per [employer]bs 20% co-insurance,admin covered at 100%

## 2020-06-17 ENCOUNTER — Encounter: Payer: Self-pay | Admitting: Physical Therapy

## 2020-06-17 ENCOUNTER — Other Ambulatory Visit: Payer: Self-pay

## 2020-06-17 ENCOUNTER — Ambulatory Visit: Payer: Medicare Other | Admitting: Physical Therapy

## 2020-06-17 DIAGNOSIS — G8929 Other chronic pain: Secondary | ICD-10-CM

## 2020-06-17 DIAGNOSIS — R2689 Other abnormalities of gait and mobility: Secondary | ICD-10-CM | POA: Diagnosis not present

## 2020-06-17 DIAGNOSIS — M545 Low back pain: Secondary | ICD-10-CM | POA: Diagnosis not present

## 2020-06-17 DIAGNOSIS — R2681 Unsteadiness on feet: Secondary | ICD-10-CM

## 2020-06-17 DIAGNOSIS — R296 Repeated falls: Secondary | ICD-10-CM | POA: Diagnosis not present

## 2020-06-17 DIAGNOSIS — M6281 Muscle weakness (generalized): Secondary | ICD-10-CM

## 2020-06-17 DIAGNOSIS — R42 Dizziness and giddiness: Secondary | ICD-10-CM | POA: Diagnosis not present

## 2020-06-17 NOTE — Therapy (Signed)
[facility] [place] [address]  [place], [place], [address] Phone: [phone]   Fax:  [phone]  Physical Therapy Treatment  Patient Details  Name: [name] MRN: [id number] Date of Birth: 22-Nov-1946 Referring Provider (PT): [name], MD   Encounter Date: 06/17/2020   PT End of Session - 06/17/20 1316    Visit Number 3    Number of Visits 12    Date for PT Re-Evaluation 06/22/20    Authorization Type [employer]    PT Start Time 1316    PT Stop Time 1406    PT Time Calculation (min) 50 min    Activity Tolerance Patient tolerated treatment well    Behavior During Therapy [facility] for tasks assessed/performed           Past Medical History:  Diagnosis Date  . Allergy   . Anemia   . Anxiety    past hx   . Arthritis   . Asthma   . Atrial flutter ([place])    past history- not current  . CAD (coronary artery disease) CARDIOLOGIST--  DR Angelena Form   mild non-obstructive cad  . Cancer ([place])    right  . Cataract    bilaterally removed   . Chronic constipation   . Chronic kidney disease    interstitial cystitis  . COPD (chronic obstructive pulmonary disease) ([place])   . Depression    past hx   . Family history of malignant hyperthermia    father had this  . Fibromyalgia   . Frequency of urination   . GERD (gastroesophageal reflux disease)   . H/O hiatal hernia   . History of basal cell carcinoma excision    X2  . History of breast cancer ONCOLOGIST-- DR Jana Hakim---  NO RECURRANCE   DX 07/2012;  LOW GRADE DCIS  ER+PR+  ----  S/P RIGHT LUMPECTOMY WITH NEGATIVE MARGINS/   RADIATION ENDED 11/2012  . History of chronic bronchitis   . History of colonic polyps   . History of tachycardia    CONTROLLED  WITH ATENOLOL  . Hyperlipidemia   . Hypertension   . Neuromuscular disorder (H[facility])    fibromyalgia  . Osteoporosis 01/2019   T score -2.2 stable/improved from prior study  . Pelvic pain   . Personal history  of radiation therapy   . S/P radiation therapy 11/12/12 - 12/05/12   right Breast  . Sepsis ([place]) 2014   from UTI   . Sinus headache   . Urgency of urination     Past Surgical History:  Procedure Laterality Date  . [place] STUDY N/A 04/03/2016   Procedure: [place] STUDY;  Surgeon: Manus Gunning, MD;  Location: [facility];  Service: Gastroenterology;  Laterality: N/A;  . BREAST LUMPECTOMY Right 10-11-2012   W/ SLN BX  . CARDIAC CATHETERIZATION  09-13-2007  DR Lia Foyer   WELL-PRESERVED LVF/  DIFFUSE SCATTERED CORONARY CALCIFACATION AND ATHEROSCLEROSIS WITHOUT OBSTRUCTION  . CARDIAC CATHETERIZATION  08-04-2010  DR Angelena Form   NON-OBSTRUCTIVE CAD/  pLAD 40%/  oLAD 30%/  mLAD 30%/  pRCA 30%/  EF 60%  . CARDIOVASCULAR STRESS TEST  06-18-2012  DR McALHANY   LOW RISK NUCLEAR STUDY/  SMALL FIXED AREA OF MODERATELY DECREASED UPTAKE IN ANTEROSEPTAL WALL WHICH MAY BE ARTIFACTUAL/  NO ISCHEMIA/  EF 68%  . COLONOSCOPY  09-29-2010  . CYSTOSCOPY    . CYSTOSCOPY WITH HYDRODISTENSION AND BIOPSY N/A 03/06/2014   Procedure: CYSTOSCOPY/HYDRODISTENSION/  INSTILATION OF MARCAINE AND PYRIDIUM;  Surgeon: Ailene Rud, MD;  Location: [facility];  Service: Urology;  Laterality: N/A;  . ESOPHAGEAL MANOMETRY N/A 04/03/2016   Procedure: ESOPHAGEAL MANOMETRY (EM);  Surgeon: Manus Gunning, MD;  Location: [facility];  Service: Gastroenterology;  Laterality: N/A;  . EXTRACORPOREAL SHOCK WAVE LITHOTRIPSY Left 02/06/2019   Procedure: EXTRACORPOREAL SHOCK WAVE LITHOTRIPSY (ESWL);  Surgeon: Kathie Rhodes, MD;  Location: [facility];  Service: Urology;  Laterality: Left;  . NASAL SINUS SURGERY  1985  . ORIF RIGHT ANKLE  FX  2006  . POLYPECTOMY    . REMOVAL VOCAL CORD CYST  FEB 2014  . RIGHT BREAST BX  08-23-2012  . RIGHT HAND SURGERY  X3  LAST ONE 2009   INCLUDES  ORIF RIGHT 5TH FINGER AND REVISION TWICE  . SKIN CANCER EXCISION    . TONSILLECTOMY AND ADENOIDECTOMY  AGE 46  .  TOTAL ABDOMINAL HYSTERECTOMY W/ BILATERAL SALPINGOOPHORECTOMY  1982   W/  APPENDECTOMY  . TRANSTHORACIC ECHOCARDIOGRAM  06-24-2012   GRADE I DIASTOLIC DYSFUNCTION/  EF 55-60%/  MILD MR  . UPPER GASTROINTESTINAL ENDOSCOPY      There were no vitals filed for this visit.   Subjective Assessment - 06/17/20 1319    Subjective Pt reports increased soreness following last visit and attempts at HEP caused her increased LBP. Pt also noting increase in her pain since she has started taking care of her dtr's dog as the dog pulls on her when she tries to walk him.    Pertinent History Recurrent falls - 2 in past 6 months (past 5 weeks), 6 major falls in past 5-6 yrs (2 concussions, finger fracture); costochondritis, cellulitis of R breast, fibromyalgia, chronic LBP since ~age 49, DDD, sciatica, osteoporosis, degenerative scoliosis, peripheral neuropathy, vertigo, R knee pain, CAD, COPD, HTN, h/o lumpectomy for R breast cancer 2013, ORIF R ankle s/p fall 2006    Patient Stated Goals "Be able to gain some strength in my legs to help with my back. To be able stand w/o feeling like I'm swaying and walk w/o tripping."    Currently in Pain? Yes    Pain Score 8     Pain Location Back    Pain Orientation Lower    Pain Descriptors / Indicators Sore;Stabbing    Pain Type Chronic pain    Pain Onset Other (comment)   since ~age 49   Pain Frequency Constant    Pain Onset --                             [facility] Adult PT Treatment/Exercise - 06/17/20 1316      Knee/Hip Exercises: Aerobic   Recumbent Bike L1 x 6 min      Knee/Hip Exercises: Standing   Functional Squat 10 reps    Functional Squat Limitations eccentric lowering to chair with UE support on counter + yellow TB hip ABD isometric      Knee/Hip Exercises: Seated   Clamshell with TheraBand Yellow   Bil x 10; alt hip ABD/ER x 10   Marching Both;10 reps    Marching Limitations yellow TB at knees - cues to avoid IR at hips while  maintaining slight pressure into hip ABD    Sit to Sand 5 reps;with UE support   YTB above knees, max VCs                 PT Education -  06/17/20 1406    Education Details Initial HEP review & modification - supported squat/eccentric stand>sit with yellow TB hip ABD isometric, seated yellow TB clam & marching    Person(s) Educated Patient    Methods Explanation;Demonstration;Handout;Verbal cues;Tactile cues    Comprehension Verbalized understanding;Returned demonstration;Verbal cues required;Tactile cues required;Need further instruction            PT Short Term Goals - 06/17/20 1323      PT SHORT TERM GOAL #1   Title Patient will be independent with initial HEP    Status On-going    Target Date 05/25/20      PT SHORT TERM GOAL #2   Title Complete FGA assessment    Status On-going             PT Long Term Goals - 05/11/20 1232      PT LONG TERM GOAL #1   Title Patient will be independent with ongoing/advanced HEP    Status New    Target Date 06/22/20      PT LONG TERM GOAL #2   Title Patient will demonstrate improved B LE strength to >/= 4/5 to 4+/5 for improved stability and ease of mobility    Status New    Target Date 06/22/20      PT LONG TERM GOAL #3   Title Patient will improve Berg score to >/= 52/56 to improve safety stability with ADLs in standing and reduce risk for falls    Status New    Target Date 06/22/20      PT LONG TERM GOAL #4   Title Patient will report no further instances of falls    Status New    Target Date 06/22/20                 Plan - 06/17/20 1323    Clinical Impression Statement [name] reports increased soreness following last PT session and states her attempts at working on the HEP have increased her LBP, although she also notes increased pain from straining against the dog pulling on the lease while walking her dtr's dog that she has recently started taking care of. HEP reviewed and modified with updated instructions  provided after extensive cueing and training during session with patient reporting better understanding of and tolerance for exercises. Given extended delay between initial eval and first treatment visit due to persistent "croupy" cough and testing for COVID (negative), her POC is about to expire, therefore she will require recert as of next visit at which time we will plan to complete dynamic gait assessment with FGA to further evaluate fall risk.    Personal Factors and Comorbidities Comorbidity 3+;Age;Fitness;Past/Current Experience    Comorbidities Recurrent falls - 2 in past 6 months (past 5 weeks), 6 major falls in past 5-6 yrs (2 concussions, finger fracture); costochondritis, cellulitis of R breast, fibromyalgia, chronic LBP since ~age 61, DDD, sciatica, osteoporosis, degenerative scoliosis, peripheral neuropathy, vertigo, R knee pain, CAD, COPD, HTN, h/o lumpectomy for R breast cancer 2013, ORIF R ankle s/p fall 2006    Examination-Activity Limitations Bathing;Bend;Carry;Dressing;Lift;Locomotion Level;Stand    Examination-Participation Restrictions Church;Community Activity;Interpersonal Relationship;Laundry;Meal Prep;Shop;Yard Work    Rehab Potential Good    PT Frequency 2x / week    PT Duration 6 weeks    PT Treatment/Interventions ADLs/Self Care Home Management;Cryotherapy;Electrical Stimulation;Iontophoresis 4mg /ml Dexamethasone;Moist Heat;Ultrasound;DME Instruction;Gait training;Stair training;Functional mobility training;Therapeutic activities;Therapeutic exercise;Balance training;Neuromuscular re-education;Patient/family education;Manual techniques;Passive range of motion;Dry needling;Taping;Vasopneumatic Device;Vestibular;Visual/perceptual remediation/compensation;Spinal Manipulations    PT Next Visit Plan Recert including  FGA; progress LE strength, postural alignment for functional strengthening, initiate balance    PT Home Exercise Plan 6/24 - supported squat/eccentric stand>sit with  yellow TB hip ABD isometric, seated yellow TB clam & marching    Consulted and Agree with Plan of Care Patient           Patient will benefit from skilled therapeutic intervention in order to improve the following deficits and impairments:  Abnormal gait, Decreased activity tolerance, Decreased balance, Decreased coordination, Decreased endurance, Decreased knowledge of precautions, Decreased mobility, Decreased safety awareness, Decreased strength, Difficulty walking, Dizziness, Increased fascial restricitons, Increased muscle spasms, Impaired perceived functional ability, Impaired flexibility, Improper body mechanics, Postural dysfunction, Pain  Visit Diagnosis: Repeated falls  Unsteadiness on feet  Muscle weakness (generalized)  Other abnormalities of gait and mobility  Dizziness and giddiness  Chronic bilateral low back pain without sciatica     Problem List Patient Active Problem List   Diagnosis Date Noted  . OSA and COPD overlap syndrome ([place]) 06/10/2020  . Serotonin withdrawal syndrome without complication 31/59/4585  . Recurrent falls 05/02/2020  . Statin intolerance 02/29/2020  . RLS (restless legs syndrome) 11/09/2019  . Kidney stone 02/26/2019  . Recurrent sinusitis 02/25/2019  . Hx of colonic polyp 02/25/2019  . DDD (degenerative disc disease), cervical 09/17/2018  . Degenerative scoliosis 04/22/2018  . Primary insomnia 06/25/2017  . Chronic interstitial cystitis 02/01/2017  . Cough 01/09/2017  . Right arm pain 07/20/2016  . Deviated septum 05/12/2016  . Nasal turbinate hypertrophy 05/12/2016  . Dysphagia   . Allergic rhinitis 01/25/2016  . Other and combined forms of senile cataract 07/15/2015  . Dyspnea   . Left hip pain 04/30/2015  . Sciatica 04/30/2015  . Knee pain, right 04/30/2015  . Bilateral carpal tunnel syndrome 03/04/2015  . Hyperlipidemia 03/01/2015  . Pulmonary nodules 02/12/2015  . External hemorrhoids 02/05/2015  . Chronic night  sweats 01/08/2015  . Atypical chest pain 01/01/2015  . Renal insufficiency 07/16/2014  . Memory loss 05/22/2014  . Peripheral neuropathy 05/22/2014  . Abdominal pain 10/26/2013  . Headache 10/13/2013  . Arthralgia 08/18/2013  . ANA positive 07/29/2013  . Depression 02/05/2013  . Family history of malignant hyperthermia 02/05/2013  . Malignant neoplasm of lower-outer quadrant of right breast of female, estrogen receptor positive ([place]) 01/03/2013  . Splenic lesion 09/02/2012  . Asthma, allergic 08/05/2012  . GERD (gastroesophageal reflux disease) 06/03/2012  . Edema 04/08/2012  . Stricture and stenosis of esophagus 10/19/2011  . Functional constipation 07/21/2011  . Nonspecific (abnormal) findings on radiological and other examination of biliary tract 07/21/2011  . Osteoporosis 04/17/2011  . Leg pain 02/24/2011  . FIBROCYSTIC BREAST DISEASE, HX OF 10/18/2010  . Essential hypertension 08/25/2010  . CAD in native artery 08/25/2010  . TOBACCO ABUSE, HX OF 08/25/2010  . GENERALIZED ANXIETY DISORDER 08/03/2010  . Fibromyalgia 01/11/2010    Percival Spanish, PT, MPT 06/17/2020, 8:15 PM  [facility] [address]  [place], [place], [address] Phone: [phone]   Fax:  [phone]  Name: [name] MRN: [id number] Date of Birth: [birth year]

## 2020-06-17 NOTE — Patient Instructions (Signed)
    Home exercise program created by Lowry Bala, PT.  For questions, please contact Laban Orourke via phone at [phone] or email at [email or web address]   [facility] [place] [address]  [address] [place], [place], [address] Phone: [phone]   Fax:  [phone]    

## 2020-06-22 ENCOUNTER — Other Ambulatory Visit: Payer: Self-pay

## 2020-06-22 ENCOUNTER — Encounter: Payer: Self-pay | Admitting: Physical Therapy

## 2020-06-22 ENCOUNTER — Ambulatory Visit: Payer: Medicare Other | Admitting: Physical Therapy

## 2020-06-22 DIAGNOSIS — R42 Dizziness and giddiness: Secondary | ICD-10-CM | POA: Diagnosis not present

## 2020-06-22 DIAGNOSIS — R296 Repeated falls: Secondary | ICD-10-CM | POA: Diagnosis not present

## 2020-06-22 DIAGNOSIS — G8929 Other chronic pain: Secondary | ICD-10-CM | POA: Diagnosis not present

## 2020-06-22 DIAGNOSIS — M6281 Muscle weakness (generalized): Secondary | ICD-10-CM | POA: Diagnosis not present

## 2020-06-22 DIAGNOSIS — R2681 Unsteadiness on feet: Secondary | ICD-10-CM

## 2020-06-22 DIAGNOSIS — R2689 Other abnormalities of gait and mobility: Secondary | ICD-10-CM

## 2020-06-22 DIAGNOSIS — M545 Low back pain, unspecified: Secondary | ICD-10-CM

## 2020-06-22 NOTE — Therapy (Addendum)
[facility] [address]  [place], [place], [address] Phone: [phone]   Fax:  [phone]  Physical Therapy Treatment / Recert  Patient Details  Name: [name] MRN: [id number] Date of Birth: Aug 11, 1946 Referring Provider (PT): [name], MD  Progress Note  Reporting Period 05/11/2020 to 06/22/2020  See note below for Objective Data and Assessment of Progress/Goals.     Encounter Date: 06/22/2020   PT End of Session - 06/22/20 0941    Visit Number 4    Number of Visits 16    Date for PT Re-Evaluation 08/10/20    Authorization Type [employer]    PT Start Time [phone]    PT Stop Time 1028    PT Time Calculation (min) 47 min    Activity Tolerance Patient tolerated treatment well    Behavior During Therapy [facility] for tasks assessed/performed           Past Medical History:  Diagnosis Date  . Allergy   . Anemia   . Anxiety    past hx   . Arthritis   . Asthma   . Atrial flutter ([place])    past history- not current  . CAD (coronary artery disease) CARDIOLOGIST--  DR Angelena Form   mild non-obstructive cad  . Cancer ([place])    right  . Cataract    bilaterally removed   . Chronic constipation   . Chronic kidney disease    interstitial cystitis  . COPD (chronic obstructive pulmonary disease) ([place])   . Depression    past hx   . Family history of malignant hyperthermia    father had this  . Fibromyalgia   . Frequency of urination   . GERD (gastroesophageal reflux disease)   . H/O hiatal hernia   . History of basal cell carcinoma excision    X2  . History of breast cancer ONCOLOGIST-- DR Jana Hakim---  NO RECURRANCE   DX 07/2012;  LOW GRADE DCIS  ER+PR+  ----  S/P RIGHT LUMPECTOMY WITH NEGATIVE MARGINS/   RADIATION ENDED 11/2012  . History of chronic bronchitis   . History of colonic polyps   . History of tachycardia    CONTROLLED  WITH ATENOLOL  . Hyperlipidemia   . Hypertension   . Neuromuscular  disorder ([facility])    fibromyalgia  . Osteoporosis 01/2019   T score -2.2 stable/improved from prior study  . Pelvic pain   . Personal history of radiation therapy   . S/P radiation therapy 11/12/12 - 12/05/12   right Breast  . Sepsis ([place]) 2014   from UTI   . Sinus headache   . Urgency of urination     Past Surgical History:  Procedure Laterality Date  . [place] STUDY N/A 04/03/2016   Procedure: [place] STUDY;  Surgeon: Manus Gunning, MD;  Location: [facility];  Service: Gastroenterology;  Laterality: N/A;  . BREAST LUMPECTOMY Right 10-11-2012   W/ SLN BX  . CARDIAC CATHETERIZATION  09-13-2007  DR Lia Foyer   WELL-PRESERVED LVF/  DIFFUSE SCATTERED CORONARY CALCIFACATION AND ATHEROSCLEROSIS WITHOUT OBSTRUCTION  . CARDIAC CATHETERIZATION  08-04-2010  DR Angelena Form   NON-OBSTRUCTIVE CAD/  pLAD 40%/  oLAD 30%/  mLAD 30%/  pRCA 30%/  EF 60%  . CARDIOVASCULAR STRESS TEST  06-18-2012  DR McALHANY   LOW RISK NUCLEAR STUDY/  SMALL FIXED AREA OF MODERATELY DECREASED UPTAKE IN ANTEROSEPTAL WALL WHICH MAY BE ARTIFACTUAL/  NO ISCHEMIA/  EF  68%  . COLONOSCOPY  09-29-2010  . CYSTOSCOPY    . CYSTOSCOPY WITH HYDRODISTENSION AND BIOPSY N/A 03/06/2014   Procedure: CYSTOSCOPY/HYDRODISTENSION/ INSTILATION OF MARCAINE AND PYRIDIUM;  Surgeon: Ailene Rud, MD;  Location: [facility];  Service: Urology;  Laterality: N/A;  . ESOPHAGEAL MANOMETRY N/A 04/03/2016   Procedure: ESOPHAGEAL MANOMETRY (EM);  Surgeon: Manus Gunning, MD;  Location: [facility];  Service: Gastroenterology;  Laterality: N/A;  . EXTRACORPOREAL SHOCK WAVE LITHOTRIPSY Left 02/06/2019   Procedure: EXTRACORPOREAL SHOCK WAVE LITHOTRIPSY (ESWL);  Surgeon: Kathie Rhodes, MD;  Location: [facility];  Service: Urology;  Laterality: Left;  . NASAL SINUS SURGERY  1985  . ORIF RIGHT ANKLE  FX  2006  . POLYPECTOMY    . REMOVAL VOCAL CORD CYST  FEB 2014  . RIGHT BREAST BX  08-23-2012  . RIGHT HAND SURGERY  X3   LAST ONE 2009   INCLUDES  ORIF RIGHT 5TH FINGER AND REVISION TWICE  . SKIN CANCER EXCISION    . TONSILLECTOMY AND ADENOIDECTOMY  AGE 75  . TOTAL ABDOMINAL HYSTERECTOMY W/ BILATERAL SALPINGOOPHORECTOMY  1982   W/  APPENDECTOMY  . TRANSTHORACIC ECHOCARDIOGRAM  06-24-2012   GRADE I DIASTOLIC DYSFUNCTION/  EF 55-60%/  MILD MR  . UPPER GASTROINTESTINAL ENDOSCOPY      There were no vitals filed for this visit.   Subjective Assessment - 06/22/20 0943    Subjective Pt reports she has been trying to walk 1/2 - 3/4 mile 3x/day - notes increased LBP and LE pain but also limited by breathing and heat/temperature.    Pertinent History Recurrent falls - 2 in past 6 months (past 5 weeks), 6 major falls in past 5-6 yrs (2 concussions, finger fracture); costochondritis, cellulitis of R breast, fibromyalgia, chronic LBP since ~age 67, DDD, sciatica, osteoporosis, degenerative scoliosis, peripheral neuropathy, vertigo, R knee pain, CAD, COPD, HTN, h/o lumpectomy for R breast cancer 2013, ORIF R ankle s/p fall 2006    Patient Stated Goals "Be able to gain some strength in my legs to help with my back. To be able stand w/o feeling like I'm swaying and walk w/o tripping."    Currently in Pain? Yes    Pain Score 6    5-6/10   Pain Location Back    Pain Orientation Lower    Pain Descriptors / Indicators Other (Comment)   "feels like somebody punched you"   Pain Type Chronic pain    Pain Onset Other (comment)   since ~age 67   Pain Frequency Constant              [facility] PT Assessment - 06/22/20 0941      Assessment   Medical Diagnosis Multiple falls    Referring Provider (PT) [name], MD    Hand Dominance Right    Next MD Visit 09/07/20      Strength   Right Hip Flexion 4-/5    Right Hip Extension 3+/5    Right Hip External Rotation  4-/5    Right Hip Internal Rotation 4-/5    Right Hip ABduction 3+/5    Right Hip ADduction 3/5   pain with lying on R greater trochanter   Left Hip Flexion 4-/5     Left Hip Extension 3+/5    Left Hip External Rotation 4-/5    Left Hip Internal Rotation 3+/5    Left Hip ABduction 3+/5    Left Hip ADduction 3+/5    Right Knee Flexion 4/5  Right Knee Extension 4/5    Left Knee Flexion 4-/5    Left Knee Extension 4-/5    Right Ankle Dorsiflexion 3+/5    Right Ankle Plantar Flexion 3/5   cramp on top of foot   Left Ankle Dorsiflexion 3+/5    Left Ankle Plantar Flexion 3-/5      Ambulation/Gait   Ambulation/Gait Assistance 5: Supervision    Assistive device None    Gait Pattern Step-through pattern;Decreased hip/knee flexion - right;Decreased hip/knee flexion - left;Poor foot clearance - left;Poor foot clearance - right;Lateral hip instability;Scissoring    Gait velocity 3.14 ft/sec    Stairs Yes    Stairs Assistance 5: Supervision    Stair Management Technique One rail Right;Alternating pattern;Forwards    Number of Stairs 14    Height of Stairs 7      Standardized Balance Assessment   10 Meter Walk 10.43 sec      Berg Balance Test   Sit to Stand Able to stand without using hands and stabilize independently    Standing Unsupported Able to stand safely 2 minutes    Sitting with Back Unsupported but Feet Supported on Floor or Stool Able to sit safely and securely 2 minutes    Stand to Sit Sits safely with minimal use of hands    Transfers Able to transfer safely, minor use of hands    Standing Unsupported with Eyes Closed Able to stand 10 seconds with supervision    Standing Unsupported with Feet Together Able to place feet together independently and stand for 1 minute with supervision    From Standing, Reach Forward with Outstretched Arm Can reach forward >12 cm safely (5")    From Standing Position, Pick up Object from [place] to pick up shoe safely and easily    From Standing Position, Turn to Look Behind Over each Shoulder Looks behind one side only/other side shows less weight shift    Turn 360 Degrees Able to turn 360 degrees  safely one side only in 4 seconds or less    Standing Unsupported, Alternately Place Feet on Step/Stool Able to stand independently and complete 8 steps >20 seconds    Standing Unsupported, One Foot in Front Able to take small step independently and hold 30 seconds    Standing on One Leg Tries to lift leg/unable to hold 3 seconds but remains standing independently    Total Score 45    Berg comment: 37-45 significant risk for falls  (>80%)      Functional Gait  Assessment   Gait assessed  Yes    Gait Level Surface Walks 20 ft in less than 7 sec but greater than 5.5 sec, uses assistive device, slower speed, mild gait deviations, or deviates 6-10 in outside of the 12 in walkway width.    Change in Gait Speed Able to change speed, demonstrates mild gait deviations, deviates 6-10 in outside of the 12 in walkway width, or no gait deviations, unable to achieve a major change in velocity, or uses a change in velocity, or uses an assistive device.    Gait with Horizontal Head Turns Performs head turns with moderate changes in gait velocity, slows down, deviates 10-15 in outside 12 in walkway width but recovers, can continue to walk.    Gait with Vertical Head Turns Performs task with moderate change in gait velocity, slows down, deviates 10-15 in outside 12 in walkway width but recovers, can continue to walk.    Gait and Pivot Turn  Pivot turns safely in greater than 3 sec and stops with no loss of balance, or pivot turns safely within 3 sec and stops with mild imbalance, requires small steps to catch balance.    Step Over Obstacle Is able to step over one shoe box (4.5 in total height) without changing gait speed. No evidence of imbalance.    Gait with Narrow Base of Support Ambulates less than 4 steps heel to toe or cannot perform without assistance.    Gait with Eyes Closed Cannot walk 20 ft without assistance, severe gait deviations or imbalance, deviates greater than 15 in outside 12 in walkway width or  will not attempt task.    Ambulating Backwards Walks 20 ft, slow speed, abnormal gait pattern, evidence for imbalance, deviates 10-15 in outside 12 in walkway width.    Steps Alternating feet, must use rail.    Total Score 13    FGA comment: < 19 = high risk fall                         [facility] Adult PT Treatment/Exercise - 06/22/20 0941      Exercises   Exercises Knee/Hip      Knee/Hip Exercises: Aerobic   Recumbent Bike L1 x 4 min                    PT Short Term Goals - 06/22/20 1015      PT SHORT TERM GOAL #1   Title Patient will be independent with initial HEP    Status Achieved   06/22/20     PT SHORT TERM GOAL #2   Title Complete FGA assessment    Status Achieved   06/22/20            PT Long Term Goals - 06/22/20 1028      PT LONG TERM GOAL #1   Title Patient will be independent with ongoing/advanced HEP    Status On-going    Target Date 08/10/20      PT LONG TERM GOAL #2   Title Patient will demonstrate improved B LE strength to >/= 4/5 to 4+/5 for improved stability and ease of mobility    Status On-going    Target Date 08/10/20      PT LONG TERM GOAL #3   Title Patient will improve Berg score to >/= 52/56 to improve safety stability with ADLs in standing and reduce risk for falls    Status On-going    Target Date 08/10/20      PT LONG TERM GOAL #4   Title Patient will report no further instances of falls    Status Partially Met    Target Date 08/10/20      PT LONG TERM GOAL #5   Title Patient will improve FGA score to >/= 20/30 to improve gait stability and reduce risk for falls    Status New    Target Date 08/10/20                 Plan - 06/22/20 0951    Clinical Impression Statement [name] reports no recent falls but continues to note unsteadiness on feet. Initial therapy slow to get started as pt was sidelined for over month following the initial eval due to persistent "croupy" cough and associated COVID-19 workup  (negative). Recent visits since return to PT have focused on LE strengthening with modifications necessary due to ongoing LBP. Slight improvement in LE strength noted  and 1-point improvement in Berg Balance test to 45/56 noted since initial eval but otherwise minimal change due to illness and limited visits thus far. Dynamic gait assessment with FGA revealing a high risk for falls at 13/30. [name] continues to have good potential to further benefit from skilled PT to address above strength and balance deficits to increase safety/stability with transfers, gait and ADLs to decrease risk for further falls, while addressing low back and hip/LE pain as needed to improve exercise and activity tolerance.    Personal Factors and Comorbidities Comorbidity 3+;Age;Fitness;Past/Current Experience    Comorbidities Recurrent falls - 2 in past 6 months (past 5 weeks), 6 major falls in past 5-6 yrs (2 concussions, finger fracture); costochondritis, cellulitis of R breast, fibromyalgia, chronic LBP since ~age 3, DDD, sciatica, osteoporosis, degenerative scoliosis, peripheral neuropathy, vertigo, R knee pain, CAD, COPD, HTN, h/o lumpectomy for R breast cancer 2013, ORIF R ankle s/p fall 2006    Examination-Activity Limitations Bathing;Bend;Carry;Dressing;Lift;Locomotion Level;Stand    Examination-Participation Restrictions Church;Community Activity;Interpersonal Relationship;Laundry;Meal Prep;Shop;Yard Work    Rehab Potential Good    PT Frequency 2x / week    PT Duration 6 weeks    PT Treatment/Interventions ADLs/Self Care Home Management;Cryotherapy;Electrical Stimulation;Iontophoresis 59m/ml Dexamethasone;Moist Heat;Ultrasound;DME Instruction;Gait training;Stair training;Functional mobility training;Therapeutic activities;Therapeutic exercise;Balance training;Neuromuscular re-education;Patient/family education;Manual techniques;Passive range of motion;Dry needling;Taping;Vasopneumatic Device;Vestibular;Visual/perceptual  remediation/compensation;Spinal Manipulations    PT Next Visit Plan progress LE strength, postural alignment for functional strengthening, initiate balance, possible ionto patch for L hip/greater trochanter pain    PT Home Exercise Plan 6/24 - supported squat/eccentric stand>sit with yellow TB hip ABD isometric, seated yellow TB clam & marching    Consulted and Agree with Plan of Care Patient           Patient will benefit from skilled therapeutic intervention in order to improve the following deficits and impairments:  Abnormal gait, Decreased activity tolerance, Decreased balance, Decreased coordination, Decreased endurance, Decreased knowledge of precautions, Decreased mobility, Decreased safety awareness, Decreased strength, Difficulty walking, Dizziness, Increased fascial restricitons, Increased muscle spasms, Impaired perceived functional ability, Impaired flexibility, Improper body mechanics, Postural dysfunction, Pain  Visit Diagnosis: Repeated falls  Unsteadiness on feet  Muscle weakness (generalized)  Other abnormalities of gait and mobility  Dizziness and giddiness  Chronic bilateral low back pain without sciatica     Problem List Patient Active Problem List   Diagnosis Date Noted  . OSA and COPD overlap syndrome ([place] 06/10/2020  . Serotonin withdrawal syndrome without complication [id number] . Recurrent falls 05/02/2020  . Statin intolerance 02/29/2020  . RLS (restless legs syndrome) 11/09/2019  . Kidney stone 02/26/2019  . Recurrent sinusitis 02/25/2019  . Hx of colonic polyp 02/25/2019  . DDD (degenerative disc disease), cervical 09/17/2018  . Degenerative scoliosis 04/22/2018  . Primary insomnia 06/25/2017  . Chronic interstitial cystitis 02/01/2017  . Cough 01/09/2017  . Right arm pain 07/20/2016  . Deviated septum 05/12/2016  . Nasal turbinate hypertrophy 05/12/2016  . Dysphagia   . Allergic rhinitis 01/25/2016  . Other and combined forms of senile  cataract 07/15/2015  . Dyspnea   . Left hip pain 04/30/2015  . Sciatica 04/30/2015  . Knee pain, right 04/30/2015  . Bilateral carpal tunnel syndrome 03/04/2015  . Hyperlipidemia 03/01/2015  . Pulmonary nodules 02/12/2015  . External hemorrhoids 02/05/2015  . Chronic night sweats 01/08/2015  . Atypical chest pain 01/01/2015  . Renal insufficiency 07/16/2014  . Memory loss 05/22/2014  . Peripheral neuropathy 05/22/2014  . Abdominal pain 10/26/2013  . Headache 10/13/2013  .  Arthralgia 08/18/2013  . ANA positive 07/29/2013  . Depression 02/05/2013  . Family history of malignant hyperthermia 02/05/2013  . Malignant neoplasm of lower-outer quadrant of right breast of female, estrogen receptor positive ([facility]) 01/03/2013  . Splenic lesion 09/02/2012  . Asthma, allergic 08/05/2012  . GERD (gastroesophageal reflux disease) 06/03/2012  . Edema 04/08/2012  . Stricture and stenosis of esophagus 10/19/2011  . Functional constipation 07/21/2011  . Nonspecific (abnormal) findings on radiological and other examination of biliary tract 07/21/2011  . Osteoporosis 04/17/2011  . Leg pain 02/24/2011  . FIBROCYSTIC BREAST DISEASE, HX OF 10/18/2010  . Essential hypertension 08/25/2010  . CAD in native artery 08/25/2010  . TOBACCO ABUSE, HX OF 08/25/2010  . GENERALIZED ANXIETY DISORDER 08/03/2010  . Fibromyalgia 01/11/2010    Percival Spanish 06/22/2020, 1:43 PM  [facility] [address]  [place], [place], [address] Phone: [phone]   Fax:  [phone]  Name: AYDA TANCREDI MRN: [id number] Date of Birth: 02/17/1946

## 2020-06-29 ENCOUNTER — Ambulatory Visit: Payer: Medicare Other | Admitting: Physical Therapy

## 2020-06-30 ENCOUNTER — Ambulatory Visit (INDEPENDENT_AMBULATORY_CARE_PROVIDER_SITE_OTHER): Payer: Medicare Other | Admitting: Gynecology

## 2020-06-30 ENCOUNTER — Telehealth: Payer: Self-pay | Admitting: *Deleted

## 2020-06-30 ENCOUNTER — Other Ambulatory Visit: Payer: Self-pay | Admitting: *Deleted

## 2020-06-30 ENCOUNTER — Other Ambulatory Visit: Payer: Self-pay

## 2020-06-30 DIAGNOSIS — M810 Age-related osteoporosis without current pathological fracture: Secondary | ICD-10-CM

## 2020-06-30 DIAGNOSIS — Z122 Encounter for screening for malignant neoplasm of respiratory organs: Secondary | ICD-10-CM

## 2020-06-30 MED ORDER — DENOSUMAB 60 MG/ML ~~LOC~~ SOSY
60.0000 mg | PREFILLED_SYRINGE | Freq: Once | SUBCUTANEOUS | Status: AC
  Administered 2020-06-30: 60 mg via SUBCUTANEOUS

## 2020-06-30 NOTE — Telephone Encounter (Signed)
Low dose lung cancer screening ct scan ordered

## 2020-07-02 ENCOUNTER — Ambulatory Visit (HOSPITAL_BASED_OUTPATIENT_CLINIC_OR_DEPARTMENT_OTHER)
Admission: RE | Admit: 2020-07-02 | Discharge: 2020-07-02 | Disposition: A | Discharge status: Home / Self Care | Payer: Medicare Other | Source: Ambulatory Visit | Attending: Family Medicine | Admitting: Family Medicine

## 2020-07-02 ENCOUNTER — Other Ambulatory Visit: Payer: Self-pay

## 2020-07-02 DIAGNOSIS — J439 Emphysema, unspecified: Secondary | ICD-10-CM | POA: Diagnosis not present

## 2020-07-02 DIAGNOSIS — Z122 Encounter for screening for malignant neoplasm of respiratory organs: Secondary | ICD-10-CM | POA: Diagnosis not present

## 2020-07-02 DIAGNOSIS — K449 Diaphragmatic hernia without obstruction or gangrene: Secondary | ICD-10-CM | POA: Diagnosis not present

## 2020-07-02 DIAGNOSIS — I2510 Atherosclerotic heart disease of native coronary artery without angina pectoris: Secondary | ICD-10-CM | POA: Diagnosis not present

## 2020-07-02 DIAGNOSIS — Z87891 Personal history of nicotine dependence: Secondary | ICD-10-CM | POA: Diagnosis not present

## 2020-07-02 DIAGNOSIS — I700 Atherosclerosis of aorta: Secondary | ICD-10-CM | POA: Insufficient documentation

## 2020-07-07 ENCOUNTER — Other Ambulatory Visit: Payer: Self-pay

## 2020-07-07 ENCOUNTER — Ambulatory Visit: Payer: Medicare Other | Attending: Family Medicine

## 2020-07-07 DIAGNOSIS — M545 Low back pain: Secondary | ICD-10-CM | POA: Insufficient documentation

## 2020-07-07 DIAGNOSIS — G8929 Other chronic pain: Secondary | ICD-10-CM

## 2020-07-07 DIAGNOSIS — R42 Dizziness and giddiness: Secondary | ICD-10-CM

## 2020-07-07 DIAGNOSIS — R2689 Other abnormalities of gait and mobility: Secondary | ICD-10-CM | POA: Diagnosis not present

## 2020-07-07 DIAGNOSIS — R296 Repeated falls: Secondary | ICD-10-CM | POA: Insufficient documentation

## 2020-07-07 DIAGNOSIS — R2681 Unsteadiness on feet: Secondary | ICD-10-CM | POA: Diagnosis not present

## 2020-07-07 DIAGNOSIS — M6281 Muscle weakness (generalized): Secondary | ICD-10-CM | POA: Insufficient documentation

## 2020-07-07 NOTE — Therapy (Signed)
[facility] [address]  [place], [place], [address] Phone: [phone]   Fax:  [phone]  Physical Therapy Treatment  Patient Details  Name: [name] MRN: [id number] Date of Birth: 1946-02-24 Referring Provider (PT): [name], MD   Encounter Date: 07/07/2020   PT End of Session - 07/07/20 1029    Visit Number 5    Number of Visits 16    Date for PT Re-Evaluation 08/10/20    Authorization Type [employer]    PT Start Time 1017    PT Stop Time 1057    PT Time Calculation (min) 40 min    Activity Tolerance Patient tolerated treatment well    Behavior During Therapy [facility] for tasks assessed/performed           Past Medical History:  Diagnosis Date  . Allergy   . Anemia   . Anxiety    past hx   . Arthritis   . Asthma   . Atrial flutter ([place])    past history- not current  . CAD (coronary artery disease) CARDIOLOGIST--  DR Angelena Form   mild non-obstructive cad  . Cancer ([place])    right  . Cataract    bilaterally removed   . Chronic constipation   . Chronic kidney disease    interstitial cystitis  . COPD (chronic obstructive pulmonary disease) ([place])   . Depression    past hx   . Family history of malignant hyperthermia    father had this  . Fibromyalgia   . Frequency of urination   . GERD (gastroesophageal reflux disease)   . H/O hiatal hernia   . History of basal cell carcinoma excision    X2  . History of breast cancer ONCOLOGIST-- DR Jana Hakim---  NO RECURRANCE   DX 07/2012;  LOW GRADE DCIS  ER+PR+  ----  S/P RIGHT LUMPECTOMY WITH NEGATIVE MARGINS/   RADIATION ENDED 11/2012  . History of chronic bronchitis   . History of colonic polyps   . History of tachycardia    CONTROLLED  WITH ATENOLOL  . Hyperlipidemia   . Hypertension   . Neuromuscular disorder (HCC)    fibromyalgia  . Osteoporosis 01/2019   T score -2.2 stable/improved from prior study  . Pelvic pain   . Personal history  of radiation therapy   . S/P radiation therapy 11/12/12 - 12/05/12   right Breast  . Sepsis ([place]) 2014   from UTI   . Sinus headache   . Urgency of urination     Past Surgical History:  Procedure Laterality Date  . [place] STUDY N/A 04/03/2016   Procedure: [place] STUDY;  Surgeon: Manus Gunning, MD;  Location: [facility];  Service: Gastroenterology;  Laterality: N/A;  . BREAST LUMPECTOMY Right 10-11-2012   W/ SLN BX  . CARDIAC CATHETERIZATION  09-13-2007  DR Lia Foyer   WELL-PRESERVED LVF/  DIFFUSE SCATTERED CORONARY CALCIFACATION AND ATHEROSCLEROSIS WITHOUT OBSTRUCTION  . CARDIAC CATHETERIZATION  08-04-2010  DR Angelena Form   NON-OBSTRUCTIVE CAD/  pLAD 40%/  oLAD 30%/  mLAD 30%/  pRCA 30%/  EF 60%  . CARDIOVASCULAR STRESS TEST  06-18-2012  DR McALHANY   LOW RISK NUCLEAR STUDY/  SMALL FIXED AREA OF MODERATELY DECREASED UPTAKE IN ANTEROSEPTAL WALL WHICH MAY BE ARTIFACTUAL/  NO ISCHEMIA/  EF 68%  . COLONOSCOPY  09-29-2010  . CYSTOSCOPY    . CYSTOSCOPY WITH HYDRODISTENSION AND BIOPSY N/A 03/06/2014   Procedure: CYSTOSCOPY/HYDRODISTENSION/  INSTILATION OF MARCAINE AND PYRIDIUM;  Surgeon: Kathi Ludwig, MD;  Location: [facility];  Service: Urology;  Laterality: N/A;  . ESOPHAGEAL MANOMETRY N/A 04/03/2016   Procedure: ESOPHAGEAL MANOMETRY (EM);  Surgeon: Ruffin Frederick, MD;  [facility];  Service: Gastroenterology;  Laterality: N/A;  . EXTRACORPOREAL SHOCK WAVE LITHOTRIPSY Left 02/06/2019   Procedure: EXTRACORPOREAL SHOCK WAVE LITHOTRIPSY (ESWL);  Surgeon: Ihor Gully, MD;  Location: [facility];  Service: Urology;  Laterality: Left;  . NASAL SINUS SURGERY  1985  . ORIF RIGHT ANKLE  FX  2006  . POLYPECTOMY    . REMOVAL VOCAL CORD CYST  FEB 2014  . RIGHT BREAST BX  08-23-2012  . RIGHT HAND SURGERY  X3  LAST ONE 2009   INCLUDES  ORIF RIGHT 5TH FINGER AND REVISION TWICE  . SKIN CANCER EXCISION    . TONSILLECTOMY AND ADENOIDECTOMY  AGE 40  .  TOTAL ABDOMINAL HYSTERECTOMY W/ BILATERAL SALPINGOOPHORECTOMY  1982   W/  APPENDECTOMY  . TRANSTHORACIC ECHOCARDIOGRAM  06-24-2012   GRADE I DIASTOLIC DYSFUNCTION/  EF 55-60%/  MILD MR  . UPPER GASTROINTESTINAL ENDOSCOPY      There were no vitals filed for this visit.   Subjective Assessment - 07/07/20 1026    Subjective Doing ok.    Pertinent History Recurrent falls - 2 in past 6 months (past 5 weeks), 6 major falls in past 5-6 yrs (2 concussions, finger fracture); costochondritis, cellulitis of R breast, fibromyalgia, chronic LBP since ~age 42, DDD, sciatica, osteoporosis, degenerative scoliosis, peripheral neuropathy, vertigo, R knee pain, CAD, COPD, HTN, h/o lumpectomy for R breast cancer 2013, ORIF R ankle s/p fall 2006    Patient Stated Goals "Be able to gain some strength in my legs to help with my back. To be able stand w/o feeling like I'm swaying and walk w/o tripping."    Currently in Pain? Yes    Pain Score 4     Pain Location Leg   B thighs and shins   Pain Orientation Right;Left;Anterior    Pain Descriptors / Indicators Burning;Tender   "muscular", "burning"   Multiple Pain Sites No                             [facility] Adult PT Treatment/Exercise - 07/07/20 0001      Neuro Re-ed    Neuro Re-ed Details  At counter with intermittent hand support: tandem walk forward/backwards x 3 laps       Knee/Hip Exercises: Aerobic   Recumbent Bike L1 x 6 min      Knee/Hip Exercises: Standing   Heel Raises Both;10 reps    Heel Raises Limitations counter     Hip Abduction Right;Left;10 reps;Knee straight;Stengthening    Abduction Limitations counter     Forward Step Up Right;Left;10 reps;Step Height: 6";Hand Hold: 1    Forward Step Up Limitations holding counter     Functional Squat 10 reps   cueing for proper technique    Functional Squat Limitations counter to chair       Knee/Hip Exercises: Seated   Clamshell with TheraBand Red   x 10 reps    Marching Both;10  reps    Marching Limitations red TB                    PT Short Term Goals - 06/22/20 1015      PT SHORT TERM GOAL #1   Title  Patient will be independent with initial HEP    Status Achieved   06/22/20     PT SHORT TERM GOAL #2   Title Complete FGA assessment    Status Achieved   06/22/20            PT Long Term Goals - 07/07/20 1029      PT LONG TERM GOAL #1   Title Patient will be independent with ongoing/advanced HEP    Status On-going      PT LONG TERM GOAL #2   Title Patient will demonstrate improved B LE strength to >/= 4/5 to 4+/5 for improved stability and ease of mobility    Status On-going      PT LONG TERM GOAL #3   Title Patient will improve Berg score to >/= 52/56 to improve safety stability with ADLs in standing and reduce risk for falls    Status On-going      PT LONG TERM GOAL #4   Title Patient will report no further instances of falls    Status Partially Met      PT LONG TERM GOAL #5   Title Patient will improve FGA score to >/= 20/30 to improve gait stability and reduce risk for falls    Status On-going                 Plan - 07/07/20 1030    Clinical Impression Statement Latondra reports she is performing HEP daily.  Does note she modified her counter squat to avoid back pain.  Demonstration revealed pt. with excessive forward translation of knees over toes which pt. admits may have caused her knee pain.  Pt. able to perform counter squat without knee or back pain after instruction today and denies need to review HEP further.  Tolerated standing balance and LE strengthening activities well today.  Ended visit with pain free thus modalities deferred.    Comorbidities Recurrent falls - 2 in past 6 months (past 5 weeks), 6 major falls in past 5-6 yrs (2 concussions, finger fracture); costochondritis, cellulitis of R breast, fibromyalgia, chronic LBP since ~age 34, DDD, sciatica, osteoporosis, degenerative scoliosis, peripheral neuropathy,  vertigo, R knee pain, CAD, COPD, HTN, h/o lumpectomy for R breast cancer 2013, ORIF R ankle s/p fall 2006    Rehab Potential Good    PT Frequency 2x / week    PT Treatment/Interventions ADLs/Self Care Home Management;Cryotherapy;Electrical Stimulation;Iontophoresis 83m/ml Dexamethasone;Moist Heat;Ultrasound;DME Instruction;Gait training;Stair training;Functional mobility training;Therapeutic activities;Therapeutic exercise;Balance training;Neuromuscular re-education;Patient/family education;Manual techniques;Passive range of motion;Dry needling;Taping;Vasopneumatic Device;Vestibular;Visual/perceptual remediation/compensation;Spinal Manipulations    PT Next Visit Plan progress LE strength, postural alignment for functional strengthening, initiate balance, possible ionto patch for L hip/greater trochanter pain    PT Home Exercise Plan 6/24 - supported squat/eccentric stand>sit with yellow TB hip ABD isometric, seated yellow TB clam & marching    Consulted and Agree with Plan of Care Patient           Patient will benefit from skilled therapeutic intervention in order to improve the following deficits and impairments:  Abnormal gait, Decreased activity tolerance, Decreased balance, Decreased coordination, Decreased endurance, Decreased knowledge of precautions, Decreased mobility, Decreased safety awareness, Decreased strength, Difficulty walking, Dizziness, Increased fascial restricitons, Increased muscle spasms, Impaired perceived functional ability, Impaired flexibility, Improper body mechanics, Postural dysfunction, Pain  Visit Diagnosis: Repeated falls  Unsteadiness on feet  Muscle weakness (generalized)  Other abnormalities of gait and mobility  Dizziness and giddiness  Chronic bilateral low back pain without sciatica  Problem List Patient Active Problem List   Diagnosis Date Noted  . OSA and COPD overlap syndrome ([place]) 06/10/2020  . Serotonin withdrawal syndrome without  complication 69/45/0388  . Recurrent falls 05/02/2020  . Statin intolerance 02/29/2020  . RLS (restless legs syndrome) 11/09/2019  . Kidney stone 02/26/2019  . Recurrent sinusitis 02/25/2019  . Hx of colonic polyp 02/25/2019  . DDD (degenerative disc disease), cervical 09/17/2018  . Degenerative scoliosis 04/22/2018  . Primary insomnia 06/25/2017  . Chronic interstitial cystitis 02/01/2017  . Cough 01/09/2017  . Right arm pain 07/20/2016  . Deviated septum 05/12/2016  . Nasal turbinate hypertrophy 05/12/2016  . Dysphagia   . Allergic rhinitis 01/25/2016  . Other and combined forms of senile cataract 07/15/2015  . Dyspnea   . Left hip pain 04/30/2015  . Sciatica 04/30/2015  . Knee pain, right 04/30/2015  . Bilateral carpal tunnel syndrome 03/04/2015  . Hyperlipidemia 03/01/2015  . Pulmonary nodules 02/12/2015  . External hemorrhoids 02/05/2015  . Chronic night sweats 01/08/2015  . Atypical chest pain 01/01/2015  . Renal insufficiency 07/16/2014  . Memory loss 05/22/2014  . Peripheral neuropathy 05/22/2014  . Abdominal pain 10/26/2013  . Headache 10/13/2013  . Arthralgia 08/18/2013  . ANA positive 07/29/2013  . Depression 02/05/2013  . Family history of malignant hyperthermia 02/05/2013  . Malignant neoplasm of lower-outer quadrant of right breast of female, estrogen receptor positive ([place]) 01/03/2013  . Splenic lesion 09/02/2012  . Asthma, allergic 08/05/2012  . GERD (gastroesophageal reflux disease) 06/03/2012  . Edema 04/08/2012  . Stricture and stenosis of esophagus 10/19/2011  . Functional constipation 07/21/2011  . Nonspecific (abnormal) findings on radiological and other examination of biliary tract 07/21/2011  . Osteoporosis 04/17/2011  . Leg pain 02/24/2011  . FIBROCYSTIC BREAST DISEASE, HX OF 10/18/2010  . Essential hypertension 08/25/2010  . CAD in native artery 08/25/2010  . TOBACCO ABUSE, HX OF 08/25/2010  . GENERALIZED ANXIETY DISORDER 08/03/2010  .  Fibromyalgia 01/11/2010    Bess Harvest, PTA 07/07/20 1:51 PM   [facility] [address]  [place], [place], [address] Phone: [phone]   Fax:  [phone]  Name: MARCY SOOKDEO MRN: [id number] Date of Birth: Mar 16, 1946

## 2020-07-09 ENCOUNTER — Ambulatory Visit: Payer: Medicare Other

## 2020-07-09 ENCOUNTER — Other Ambulatory Visit: Payer: Self-pay

## 2020-07-09 ENCOUNTER — Other Ambulatory Visit: Payer: Self-pay | Admitting: Family Medicine

## 2020-07-09 DIAGNOSIS — R296 Repeated falls: Secondary | ICD-10-CM

## 2020-07-09 DIAGNOSIS — M545 Low back pain: Secondary | ICD-10-CM | POA: Diagnosis not present

## 2020-07-09 DIAGNOSIS — R2681 Unsteadiness on feet: Secondary | ICD-10-CM | POA: Diagnosis not present

## 2020-07-09 DIAGNOSIS — M6281 Muscle weakness (generalized): Secondary | ICD-10-CM

## 2020-07-09 DIAGNOSIS — R2689 Other abnormalities of gait and mobility: Secondary | ICD-10-CM | POA: Diagnosis not present

## 2020-07-09 DIAGNOSIS — R42 Dizziness and giddiness: Secondary | ICD-10-CM | POA: Diagnosis not present

## 2020-07-09 DIAGNOSIS — G8929 Other chronic pain: Secondary | ICD-10-CM | POA: Diagnosis not present

## 2020-07-09 NOTE — Therapy (Signed)
[facility] [address]  [place], [place], [address] Phone: [phone]   Fax:  [phone]  Physical Therapy Treatment  Patient Details  Name: [name] MRN: [id number] Date of Birth: Oct 23, 1946 Referring Provider (PT): [name], MD   Encounter Date: 07/09/2020   PT End of Session - 07/09/20 1023    Visit Number 6    Number of Visits 16    Date for PT Re-Evaluation 08/10/20    Authorization Type Blue Medicare    PT Start Time 1016    PT Stop Time 1105    PT Time Calculation (min) 49 min    Activity Tolerance Patient tolerated treatment well    Behavior During Therapy [facility] for tasks assessed/performed           Past Medical History:  Diagnosis Date  . Allergy   . Anemia   . Anxiety    past hx   . Arthritis   . Asthma   . Atrial flutter ([place])    past history- not current  . CAD (coronary artery disease) CARDIOLOGIST--  DR Angelena Form   mild non-obstructive cad  . Cancer ([place])    right  . Cataract    bilaterally removed   . Chronic constipation   . Chronic kidney disease    interstitial cystitis  . COPD (chronic obstructive pulmonary disease) ([place])   . Depression    past hx   . Family history of malignant hyperthermia    father had this  . Fibromyalgia   . Frequency of urination   . GERD (gastroesophageal reflux disease)   . H/O hiatal hernia   . History of basal cell carcinoma excision    X2  . History of breast cancer ONCOLOGIST-- DR Jana Hakim---  NO RECURRANCE   DX 07/2012;  LOW GRADE DCIS  ER+PR+  ----  S/P RIGHT LUMPECTOMY WITH NEGATIVE MARGINS/   RADIATION ENDED 11/2012  . History of chronic bronchitis   . History of colonic polyps   . History of tachycardia    CONTROLLED  WITH ATENOLOL  . Hyperlipidemia   . Hypertension   . Neuromuscular disorder (HCC)    fibromyalgia  . Osteoporosis 01/2019   T score -2.2 stable/improved from prior study  . Pelvic pain   . Personal history  of radiation therapy   . S/P radiation therapy 11/12/12 - 12/05/12   right Breast  . Sepsis ([place]) 2014   from UTI   . Sinus headache   . Urgency of urination     Past Surgical History:  Procedure Laterality Date  . [place] STUDY N/A 04/03/2016   Procedure: [place] STUDY;  Surgeon: Manus Gunning, MD;  [facility];  Service: Gastroenterology;  Laterality: N/A;  . BREAST LUMPECTOMY Right 10-11-2012   W/ SLN BX  . CARDIAC CATHETERIZATION  09-13-2007  DR Lia Foyer   WELL-PRESERVED LVF/  DIFFUSE SCATTERED CORONARY CALCIFACATION AND ATHEROSCLEROSIS WITHOUT OBSTRUCTION  . CARDIAC CATHETERIZATION  08-04-2010  DR Angelena Form   NON-OBSTRUCTIVE CAD/  pLAD 40%/  oLAD 30%/  mLAD 30%/  pRCA 30%/  EF 60%  . CARDIOVASCULAR STRESS TEST  06-18-2012  DR McALHANY   LOW RISK NUCLEAR STUDY/  SMALL FIXED AREA OF MODERATELY DECREASED UPTAKE IN ANTEROSEPTAL WALL WHICH MAY BE ARTIFACTUAL/  NO ISCHEMIA/  EF 68%  . COLONOSCOPY  09-29-2010  . CYSTOSCOPY    . CYSTOSCOPY WITH HYDRODISTENSION AND BIOPSY N/A 03/06/2014   Procedure: CYSTOSCOPY/HYDRODISTENSION/  INSTILATION OF MARCAINE AND PYRIDIUM;  Surgeon: Ailene Rud, MD;  Location: [facility];  Service: Urology;  Laterality: N/A;  . ESOPHAGEAL MANOMETRY N/A 04/03/2016   Procedure: ESOPHAGEAL MANOMETRY (EM);  Surgeon: Manus Gunning, MD;  [facility];  Service: Gastroenterology;  Laterality: N/A;  . EXTRACORPOREAL SHOCK WAVE LITHOTRIPSY Left 02/06/2019   Procedure: EXTRACORPOREAL SHOCK WAVE LITHOTRIPSY (ESWL);  Surgeon: Kathie Rhodes, MD;  [facility];  Service: Urology;  Laterality: Left;  . NASAL SINUS SURGERY  1985  . ORIF RIGHT ANKLE  FX  2006  . POLYPECTOMY    . REMOVAL VOCAL CORD CYST  FEB 2014  . RIGHT BREAST BX  08-23-2012  . RIGHT HAND SURGERY  X3  LAST ONE 2009   INCLUDES  ORIF RIGHT 5TH FINGER AND REVISION TWICE  . SKIN CANCER EXCISION    . TONSILLECTOMY AND ADENOIDECTOMY  AGE 9  .  TOTAL ABDOMINAL HYSTERECTOMY W/ BILATERAL SALPINGOOPHORECTOMY  1982   W/  APPENDECTOMY  . TRANSTHORACIC ECHOCARDIOGRAM  06-24-2012   GRADE I DIASTOLIC DYSFUNCTION/  EF 55-60%/  MILD MR  . UPPER GASTROINTESTINAL ENDOSCOPY      There were no vitals filed for this visit.   Subjective Assessment - 07/09/20 1021    Subjective Pt. reporting some muscular soreness after last session in anterior thighs.    Pertinent History Recurrent falls - 2 in past 6 months (past 5 weeks), 6 major falls in past 5-6 yrs (2 concussions, finger fracture); costochondritis, cellulitis of R breast, fibromyalgia, chronic LBP since ~age 38, DDD, sciatica, osteoporosis, degenerative scoliosis, peripheral neuropathy, vertigo, R knee pain, CAD, COPD, HTN, h/o lumpectomy for R breast cancer 2013, ORIF R ankle s/p fall 2006    Patient Stated Goals "Be able to gain some strength in my legs to help with my back. To be able stand w/o feeling like I'm swaying and walk w/o tripping."    Currently in Pain? Yes    Pain Score 2     Pain Location Leg    Pain Orientation Right;Left;Anterior   R>L   Pain Descriptors / Indicators Sore    Multiple Pain Sites No                             [facility] Adult PT Treatment/Exercise - 07/09/20 0001      Neuro Re-ed    Neuro Re-ed Details  at counter: forward tandem eyes closed/backwards tandem x 3 laps at counter; B SLS 3 x 10 sec at counter with finger tip support      Lumbar Exercises: Stretches   Single Knee to Chest Stretch Right;Left;2 reps;20 seconds    Single Knee to Chest Stretch Limitations B supine     Quadruped Mid Back Stretch 3 reps;20 seconds    Quadruped Mid Back Stretch Limitations R/L, forward lumbar stretch with green p-ball rollouts      Knee/Hip Exercises: Aerobic   Recumbent Bike L1 x 6 min      Knee/Hip Exercises: Standing   Functional Squat 10 reps   at counter    Functional Squat Limitations counter to chair       Knee/Hip Exercises: Supine    Bridges with Clamshell Both;10 reps;Strengthening   B hip abd/ER into yellow TB at knees; limited ROM     Modalities   Modalities --      Manual Therapy   Kinesiotex Create Space      Kinesiotix  Create Space R knee chondromalacia patella taping pattern: (30% stretch on all strips)                    PT Short Term Goals - 06/22/20 1015      PT SHORT TERM GOAL #1   Title Patient will be independent with initial HEP    Status Achieved   06/22/20     PT SHORT TERM GOAL #2   Title Complete FGA assessment    Status Achieved   06/22/20            PT Long Term Goals - 07/07/20 1029      PT LONG TERM GOAL #1   Title Patient will be independent with ongoing/advanced HEP    Status On-going      PT LONG TERM GOAL #2   Title Patient will demonstrate improved B LE strength to >/= 4/5 to 4+/5 for improved stability and ease of mobility    Status On-going      PT LONG TERM GOAL #3   Title Patient will improve Berg score to >/= 52/56 to improve safety stability with ADLs in standing and reduce risk for falls    Status On-going      PT LONG TERM GOAL #4   Title Patient will report no further instances of falls    Status Partially Met      PT LONG TERM GOAL #5   Title Patient will improve FGA score to >/= 20/30 to improve gait stability and reduce risk for falls    Status On-going                 Plan - 07/09/20 1026    Clinical Impression Statement Pt. noting one day of muscular soreness after last session which has subsided this morning.  Progressed standing balance and LE strengthening activities today which were well tolerated with exception back pain with mini squat.  Able to reduce complaint of LBP after seated p-ball rollouts stretch and trialed K-taping to R knee as pt. with complaint of intermittent R knee pain at home.  Sweta reports compliance with HEP and seems motivated in therapy sessions.  Has not complained of L hip pain over last two sessions.  Will  consider HEP update in coming sessions.    Comorbidities Recurrent falls - 2 in past 6 months (past 5 weeks), 6 major falls in past 5-6 yrs (2 concussions, finger fracture); costochondritis, cellulitis of R breast, fibromyalgia, chronic LBP since ~age 26, DDD, sciatica, osteoporosis, degenerative scoliosis, peripheral neuropathy, vertigo, R knee pain, CAD, COPD, HTN, h/o lumpectomy for R breast cancer 2013, ORIF R ankle s/p fall 2006    Rehab Potential Good    PT Frequency 2x / week    PT Treatment/Interventions ADLs/Self Care Home Management;Cryotherapy;Electrical Stimulation;Iontophoresis 73m/ml Dexamethasone;Moist Heat;Ultrasound;DME Instruction;Gait training;Stair training;Functional mobility training;Therapeutic activities;Therapeutic exercise;Balance training;Neuromuscular re-education;Patient/family education;Manual techniques;Passive range of motion;Dry needling;Taping;Vasopneumatic Device;Vestibular;Visual/perceptual remediation/compensation;Spinal Manipulations    PT Next Visit Plan progress LE strength, postural alignment for functional strengthening, initiate balance, possible ionto patch for L hip/greater trochanter pain    PT Home Exercise Plan 6/24 - supported squat/eccentric stand>sit with yellow TB hip ABD isometric, seated yellow TB clam & marching    Consulted and Agree with Plan of Care Patient           Patient will benefit from skilled therapeutic intervention in order to improve the following deficits and impairments:  Abnormal gait, Decreased activity tolerance, Decreased balance, Decreased coordination, Decreased  endurance, Decreased knowledge of precautions, Decreased mobility, Decreased safety awareness, Decreased strength, Difficulty walking, Dizziness, Increased fascial restricitons, Increased muscle spasms, Impaired perceived functional ability, Impaired flexibility, Improper body mechanics, Postural dysfunction, Pain  Visit Diagnosis: Repeated falls  Unsteadiness on  feet  Muscle weakness (generalized)  Other abnormalities of gait and mobility  Dizziness and giddiness     Problem List Patient Active Problem List   Diagnosis Date Noted  . OSA and COPD overlap syndrome ([place]) 06/10/2020  . Serotonin withdrawal syndrome without complication 26/37/8588  . Recurrent falls 05/02/2020  . Statin intolerance 02/29/2020  . RLS (restless legs syndrome) 11/09/2019  . Kidney stone 02/26/2019  . Recurrent sinusitis 02/25/2019  . Hx of colonic polyp 02/25/2019  . DDD (degenerative disc disease), cervical 09/17/2018  . Degenerative scoliosis 04/22/2018  . Primary insomnia 06/25/2017  . Chronic interstitial cystitis 02/01/2017  . Cough 01/09/2017  . Right arm pain 07/20/2016  . Deviated septum 05/12/2016  . Nasal turbinate hypertrophy 05/12/2016  . Dysphagia   . Allergic rhinitis 01/25/2016  . Other and combined forms of senile cataract 07/15/2015  . Dyspnea   . Left hip pain 04/30/2015  . Sciatica 04/30/2015  . Knee pain, right 04/30/2015  . Bilateral carpal tunnel syndrome 03/04/2015  . Hyperlipidemia 03/01/2015  . Pulmonary nodules 02/12/2015  . External hemorrhoids 02/05/2015  . Chronic night sweats 01/08/2015  . Atypical chest pain 01/01/2015  . Renal insufficiency 07/16/2014  . Memory loss 05/22/2014  . Peripheral neuropathy 05/22/2014  . Abdominal pain 10/26/2013  . Headache 10/13/2013  . Arthralgia 08/18/2013  . ANA positive 07/29/2013  . Depression 02/05/2013  . Family history of malignant hyperthermia 02/05/2013  . Malignant neoplasm of lower-outer quadrant of right breast of female, estrogen receptor positive ([place]) 01/03/2013  . Splenic lesion 09/02/2012  . Asthma, allergic 08/05/2012  . GERD (gastroesophageal reflux disease) 06/03/2012  . Edema 04/08/2012  . Stricture and stenosis of esophagus 10/19/2011  . Functional constipation 07/21/2011  . Nonspecific (abnormal) findings on radiological and other examination of biliary  tract 07/21/2011  . Osteoporosis 04/17/2011  . Leg pain 02/24/2011  . FIBROCYSTIC BREAST DISEASE, HX OF 10/18/2010  . Essential hypertension 08/25/2010  . CAD in native artery 08/25/2010  . TOBACCO ABUSE, HX OF 08/25/2010  . GENERALIZED ANXIETY DISORDER 08/03/2010  . Fibromyalgia 01/11/2010    Bess Harvest, PTA 07/09/20 11:29 AM   [facility] [address]  [place], [place], [address] Phone: [phone]   Fax:  [phone]  Name: IYANIA DENNE MRN: [id number] Date of Birth: Mar 17, 1946

## 2020-07-14 ENCOUNTER — Ambulatory Visit: Payer: Medicare Other

## 2020-07-14 ENCOUNTER — Other Ambulatory Visit: Payer: Self-pay

## 2020-07-14 DIAGNOSIS — R2689 Other abnormalities of gait and mobility: Secondary | ICD-10-CM | POA: Diagnosis not present

## 2020-07-14 DIAGNOSIS — R42 Dizziness and giddiness: Secondary | ICD-10-CM

## 2020-07-14 DIAGNOSIS — G8929 Other chronic pain: Secondary | ICD-10-CM

## 2020-07-14 DIAGNOSIS — R296 Repeated falls: Secondary | ICD-10-CM | POA: Diagnosis not present

## 2020-07-14 DIAGNOSIS — M545 Low back pain, unspecified: Secondary | ICD-10-CM

## 2020-07-14 DIAGNOSIS — R2681 Unsteadiness on feet: Secondary | ICD-10-CM

## 2020-07-14 DIAGNOSIS — M6281 Muscle weakness (generalized): Secondary | ICD-10-CM

## 2020-07-14 NOTE — Therapy (Signed)
[facility] [address]  [place], [place], [address] Phone: [phone]   Fax:  [phone]  Physical Therapy Treatment  Patient Details  Name: [name] MRN: [id number] Date of Birth: 1946/09/24 Referring Provider (PT): [name], MD   Encounter Date: 07/14/2020   PT End of Session - 07/14/20 1024    Visit Number 7    Number of Visits 16    Date for PT Re-Evaluation 08/10/20    Authorization Type [employer]    PT Start Time 1017    PT Stop Time 1103    PT Time Calculation (min) 46 min    Activity Tolerance Patient tolerated treatment well    Behavior During Therapy [facility] for tasks assessed/performed           Past Medical History:  Diagnosis Date  . Allergy   . Anemia   . Anxiety    past hx   . Arthritis   . Asthma   . Atrial flutter ([place])    past history- not current  . CAD (coronary artery disease) CARDIOLOGIST--  DR Angelena Form   mild non-obstructive cad  . Cancer ([place])    right  . Cataract    bilaterally removed   . Chronic constipation   . Chronic kidney disease    interstitial cystitis  . COPD (chronic obstructive pulmonary disease) ([place])   . Depression    past hx   . Family history of malignant hyperthermia    father had this  . Fibromyalgia   . Frequency of urination   . GERD (gastroesophageal reflux disease)   . H/O hiatal hernia   . History of basal cell carcinoma excision    X2  . History of breast cancer ONCOLOGIST-- DR Jana Hakim---  NO RECURRANCE   DX 07/2012;  LOW GRADE DCIS  ER+PR+  ----  S/P RIGHT LUMPECTOMY WITH NEGATIVE MARGINS/   RADIATION ENDED 11/2012  . History of chronic bronchitis   . History of colonic polyps   . History of tachycardia    CONTROLLED  WITH ATENOLOL  . Hyperlipidemia   . Hypertension   . Neuromuscular disorder (HCC)    fibromyalgia  . Osteoporosis 01/2019   T score -2.2 stable/improved from prior study  . Pelvic pain   . Personal history  of radiation therapy   . S/P radiation therapy 11/12/12 - 12/05/12   right Breast  . Sepsis ([place]) 2014   from UTI   . Sinus headache   . Urgency of urination     Past Surgical History:  Procedure Laterality Date  . [place] STUDY N/A 04/03/2016   Procedure: [place] STUDY;  Surgeon: Manus Gunning, MD;  Location: [facility];  Service: Gastroenterology;  Laterality: N/A;  . BREAST LUMPECTOMY Right 10-11-2012   W/ SLN BX  . CARDIAC CATHETERIZATION  09-13-2007  DR Lia Foyer   WELL-PRESERVED LVF/  DIFFUSE SCATTERED CORONARY CALCIFACATION AND ATHEROSCLEROSIS WITHOUT OBSTRUCTION  . CARDIAC CATHETERIZATION  08-04-2010  DR Angelena Form   NON-OBSTRUCTIVE CAD/  pLAD 40%/  oLAD 30%/  mLAD 30%/  pRCA 30%/  EF 60%  . CARDIOVASCULAR STRESS TEST  06-18-2012  DR McALHANY   LOW RISK NUCLEAR STUDY/  SMALL FIXED AREA OF MODERATELY DECREASED UPTAKE IN ANTEROSEPTAL WALL WHICH MAY BE ARTIFACTUAL/  NO ISCHEMIA/  EF 68%  . COLONOSCOPY  09-29-2010  . CYSTOSCOPY    . CYSTOSCOPY WITH HYDRODISTENSION AND BIOPSY N/A 03/06/2014   Procedure: CYSTOSCOPY/HYDRODISTENSION/  INSTILATION OF MARCAINE AND PYRIDIUM;  Surgeon: Ailene Rud, MD;  Location: [facility];  Service: Urology;  Laterality: N/A;  . ESOPHAGEAL MANOMETRY N/A 04/03/2016   Procedure: ESOPHAGEAL MANOMETRY (EM);  Surgeon: Manus Gunning, MD;  Location: [facility];  Service: Gastroenterology;  Laterality: N/A;  . EXTRACORPOREAL SHOCK WAVE LITHOTRIPSY Left 02/06/2019   Procedure: EXTRACORPOREAL SHOCK WAVE LITHOTRIPSY (ESWL);  Surgeon: Kathie Rhodes, MD;  Location: [facility];  Service: Urology;  Laterality: Left;  . NASAL SINUS SURGERY  1985  . ORIF RIGHT ANKLE  FX  2006  . POLYPECTOMY    . REMOVAL VOCAL CORD CYST  FEB 2014  . RIGHT BREAST BX  08-23-2012  . RIGHT HAND SURGERY  X3  LAST ONE 2009   INCLUDES  ORIF RIGHT 5TH FINGER AND REVISION TWICE  . SKIN CANCER EXCISION    . TONSILLECTOMY AND ADENOIDECTOMY  AGE 2  .  TOTAL ABDOMINAL HYSTERECTOMY W/ BILATERAL SALPINGOOPHORECTOMY  1982   W/  APPENDECTOMY  . TRANSTHORACIC ECHOCARDIOGRAM  06-24-2012   GRADE I DIASTOLIC DYSFUNCTION/  EF 55-60%/  MILD MR  . UPPER GASTROINTESTINAL ENDOSCOPY      There were no vitals filed for this visit.   Subjective Assessment - 07/14/20 1025    Subjective Has been doing the HEP.    Pertinent History Recurrent falls - 2 in past 6 months (past 5 weeks), 6 major falls in past 5-6 yrs (2 concussions, finger fracture); costochondritis, cellulitis of R breast, fibromyalgia, chronic LBP since ~age 53, DDD, sciatica, osteoporosis, degenerative scoliosis, peripheral neuropathy, vertigo, R knee pain, CAD, COPD, HTN, h/o lumpectomy for R breast cancer 2013, ORIF R ankle s/p fall 2006    Patient Stated Goals "Be able to gain some strength in my legs to help with my back. To be able stand w/o feeling like I'm swaying and walk w/o tripping."    Currently in Pain? No/denies    Pain Score 0-No pain    Multiple Pain Sites No                             [facility] Adult PT Treatment/Exercise - 07/14/20 0001      Ambulation/Gait   Stairs Yes    Stairs Assistance 5: Supervision    Stairs Assistance Details (indicate cue type and reason) cued patient to navigate with alternating step-pattern - pt. able to without excessive UE support on rail     Stair Management Technique One rail Right;Alternating pattern;Step to pattern    Number of Stairs 14    Height of Stairs 7      Neuro Re-ed    Neuro Re-ed Details  balance training on red floor mat; forward bolster step-overs 6 x 3 bolsters, lateral bolster step-overs 6 x 3 bolsters, standard stance eyes closed balance 2 x 20 sec, standard stance eyes closed lateral head turns balance 2 x 20 sec       Knee/Hip Exercises: Aerobic   Recumbent Bike L1,2 x 6 min   3 min lvl 2, 3 min lvl 1     Manual Therapy   Kinesiotex Create Space      Kinesiotix   Create Space L knee  chondromalacia patella taping pattern: (30% stretch on all strips)                    PT Short Term Goals - 06/22/20 1015      PT SHORT TERM GOAL #  1   Title Patient will be independent with initial HEP    Status Achieved   06/22/20     PT SHORT TERM GOAL #2   Title Complete FGA assessment    Status Achieved   06/22/20            PT Long Term Goals - 07/07/20 1029      PT LONG TERM GOAL #1   Title Patient will be independent with ongoing/advanced HEP    Status On-going      PT LONG TERM GOAL #2   Title Patient will demonstrate improved B LE strength to >/= 4/5 to 4+/5 for improved stability and ease of mobility    Status On-going      PT LONG TERM GOAL #3   Title Patient will improve Berg score to >/= 52/56 to improve safety stability with ADLs in standing and reduce risk for falls    Status On-going      PT LONG TERM GOAL #4   Title Patient will report no further instances of falls    Status Partially Met      PT LONG TERM GOAL #5   Title Patient will improve FGA score to >/= 20/30 to improve gait stability and reduce risk for falls    Status On-going                 Plan - 07/14/20 1214    Clinical Impression Statement [place] reporting improved ability to navigate stairs with less need for UE assistance.  Feels the K-taping trialed last visit on knee helped to reduce routine knee pain throughout day.    Comorbidities Recurrent falls - 2 in past 6 months (past 5 weeks), 6 major falls in past 5-6 yrs (2 concussions, finger fracture); costochondritis, cellulitis of R breast, fibromyalgia, chronic LBP since ~age 54, DDD, sciatica, osteoporosis, degenerative scoliosis, peripheral neuropathy, vertigo, R knee pain, CAD, COPD, HTN, h/o lumpectomy for R breast cancer 2013, ORIF R ankle s/p fall 2006    Rehab Potential Good    PT Frequency 2x / week    PT Treatment/Interventions ADLs/Self Care Home Management;Cryotherapy;Electrical Stimulation;Iontophoresis 6m/ml  Dexamethasone;Moist Heat;Ultrasound;DME Instruction;Gait training;Stair training;Functional mobility training;Therapeutic activities;Therapeutic exercise;Balance training;Neuromuscular re-education;Patient/family education;Manual techniques;Passive range of motion;Dry needling;Taping;Vasopneumatic Device;Vestibular;Visual/perceptual remediation/compensation;Spinal Manipulations    PT Next Visit Plan progress LE strength, postural alignment for functional strengthening, continue balance training, possible ionto patch for L hip/greater trochanter pain    PT Home Exercise Plan 6/24 - supported squat/eccentric stand>sit with yellow TB hip ABD isometric, seated yellow TB clam & marching    Consulted and Agree with Plan of Care Patient           Patient will benefit from skilled therapeutic intervention in order to improve the following deficits and impairments:  Abnormal gait, Decreased activity tolerance, Decreased balance, Decreased coordination, Decreased endurance, Decreased knowledge of precautions, Decreased mobility, Decreased safety awareness, Decreased strength, Difficulty walking, Dizziness, Increased fascial restricitons, Increased muscle spasms, Impaired perceived functional ability, Impaired flexibility, Improper body mechanics, Postural dysfunction, Pain  Visit Diagnosis: Repeated falls  Unsteadiness on feet  Muscle weakness (generalized)  Other abnormalities of gait and mobility  Dizziness and giddiness  Chronic bilateral low back pain without sciatica     Problem List Patient Active Problem List   Diagnosis Date Noted  . OSA and COPD overlap syndrome ([place] 06/10/2020  . Serotonin withdrawal syndrome without complication 077/10/6578 . Recurrent falls 05/02/2020  . Statin intolerance 02/29/2020  . RLS (restless legs syndrome)  11/09/2019  . Kidney stone 02/26/2019  . Recurrent sinusitis 02/25/2019  . Hx of colonic polyp 02/25/2019  . DDD (degenerative disc disease),  cervical 09/17/2018  . Degenerative scoliosis 04/22/2018  . Primary insomnia 06/25/2017  . Chronic interstitial cystitis 02/01/2017  . Cough 01/09/2017  . Right arm pain 07/20/2016  . Deviated septum 05/12/2016  . Nasal turbinate hypertrophy 05/12/2016  . Dysphagia   . Allergic rhinitis 01/25/2016  . Other and combined forms of senile cataract 07/15/2015  . Dyspnea   . Left hip pain 04/30/2015  . Sciatica 04/30/2015  . Knee pain, right 04/30/2015  . Bilateral carpal tunnel syndrome 03/04/2015  . Hyperlipidemia 03/01/2015  . Pulmonary nodules 02/12/2015  . External hemorrhoids 02/05/2015  . Chronic night sweats 01/08/2015  . Atypical chest pain 01/01/2015  . Renal insufficiency 07/16/2014  . Memory loss 05/22/2014  . Peripheral neuropathy 05/22/2014  . Abdominal pain 10/26/2013  . Headache 10/13/2013  . Arthralgia 08/18/2013  . ANA positive 07/29/2013  . Depression 02/05/2013  . Family history of malignant hyperthermia 02/05/2013  . Malignant neoplasm of lower-outer quadrant of right breast of female, estrogen receptor positive ([place]) 01/03/2013  . Splenic lesion 09/02/2012  . Asthma, allergic 08/05/2012  . GERD (gastroesophageal reflux disease) 06/03/2012  . Edema 04/08/2012  . Stricture and stenosis of esophagus 10/19/2011  . Functional constipation 07/21/2011  . Nonspecific (abnormal) findings on radiological and other examination of biliary tract 07/21/2011  . Osteoporosis 04/17/2011  . Leg pain 02/24/2011  . FIBROCYSTIC BREAST DISEASE, HX OF 10/18/2010  . Essential hypertension 08/25/2010  . CAD in native artery 08/25/2010  . TOBACCO ABUSE, HX OF 08/25/2010  . GENERALIZED ANXIETY DISORDER 08/03/2010  . Fibromyalgia 01/11/2010    Bess Harvest, PTA 07/14/20 12:24 PM   [facility] [address]  [place], [place], [address] Phone: [phone]   Fax:  [phone]  Name: ARIANNIE PENALOZA MRN:  [id number] Date of Birth: 1946/10/23

## 2020-07-16 ENCOUNTER — Ambulatory Visit: Payer: Medicare Other | Admitting: Physical Therapy

## 2020-07-16 ENCOUNTER — Other Ambulatory Visit: Payer: Self-pay

## 2020-07-16 ENCOUNTER — Encounter: Payer: Self-pay | Admitting: Physical Therapy

## 2020-07-16 DIAGNOSIS — M6281 Muscle weakness (generalized): Secondary | ICD-10-CM | POA: Diagnosis not present

## 2020-07-16 DIAGNOSIS — R2689 Other abnormalities of gait and mobility: Secondary | ICD-10-CM | POA: Diagnosis not present

## 2020-07-16 DIAGNOSIS — R42 Dizziness and giddiness: Secondary | ICD-10-CM | POA: Diagnosis not present

## 2020-07-16 DIAGNOSIS — G8929 Other chronic pain: Secondary | ICD-10-CM | POA: Diagnosis not present

## 2020-07-16 DIAGNOSIS — R296 Repeated falls: Secondary | ICD-10-CM | POA: Diagnosis not present

## 2020-07-16 DIAGNOSIS — R2681 Unsteadiness on feet: Secondary | ICD-10-CM

## 2020-07-16 DIAGNOSIS — M545 Low back pain: Secondary | ICD-10-CM | POA: Diagnosis not present

## 2020-07-16 NOTE — Therapy (Signed)
[facility] [address]  [place], [place], [address] Phone: [phone]   Fax:  [phone]  Physical Therapy Treatment  Patient Details  Name: [name] MRN: [id number] Date of Birth: [birth year] Referring Provider (PT): [name], MD   Encounter Date: 07/16/2020   PT End of Session - 07/16/20 1017    Visit Number 8    Number of Visits 16    Date for PT Re-Evaluation 08/10/20    Authorization Type [employer]    PT Start Time 1017    PT Stop Time 1102    PT Time Calculation (min) 45 min    Activity Tolerance Patient tolerated treatment well    Behavior During Therapy [facility] for tasks assessed/performed           Past Medical History:  Diagnosis Date  . Allergy   . Anemia   . Anxiety    past hx   . Arthritis   . Asthma   . Atrial flutter ([place])    past history- not current  . CAD (coronary artery disease) CARDIOLOGIST--  DR Angelena Form   mild non-obstructive cad  . Cancer ([place])    right  . Cataract    bilaterally removed   . Chronic constipation   . Chronic kidney disease    interstitial cystitis  . COPD (chronic obstructive pulmonary disease) ([place])   . Depression    past hx   . Family history of malignant hyperthermia    father had this  . Fibromyalgia   . Frequency of urination   . GERD (gastroesophageal reflux disease)   . H/O hiatal hernia   . History of basal cell carcinoma excision    X2  . History of breast cancer ONCOLOGIST-- DR Jana Hakim---  NO RECURRANCE   DX 07/2012;  LOW GRADE DCIS  ER+PR+  ----  S/P RIGHT LUMPECTOMY WITH NEGATIVE MARGINS/   RADIATION ENDED 11/2012  . History of chronic bronchitis   . History of colonic polyps   . History of tachycardia    CONTROLLED  WITH ATENOLOL  . Hyperlipidemia   . Hypertension   . Neuromuscular disorder (HCC)    fibromyalgia  . Osteoporosis 01/2019   T score -2.2 stable/improved from prior study  . Pelvic pain   . Personal history  of radiation therapy   . S/P radiation therapy 11/12/12 - 12/05/12   right Breast  . Sepsis ([place]) 2014   from UTI   . Sinus headache   . Urgency of urination     Past Surgical History:  Procedure Laterality Date  . [place] STUDY N/A 04/03/2016   Procedure: [place] STUDY;  Surgeon: Manus Gunning, MD;  Location: [facility];  Service: Gastroenterology;  Laterality: N/A;  . BREAST LUMPECTOMY Right 10-11-2012   W/ SLN BX  . CARDIAC CATHETERIZATION  09-13-2007  DR Lia Foyer   WELL-PRESERVED LVF/  DIFFUSE SCATTERED CORONARY CALCIFACATION AND ATHEROSCLEROSIS WITHOUT OBSTRUCTION  . CARDIAC CATHETERIZATION  08-04-2010  DR Angelena Form   NON-OBSTRUCTIVE CAD/  pLAD 40%/  oLAD 30%/  mLAD 30%/  pRCA 30%/  EF 60%  . CARDIOVASCULAR STRESS TEST  06-18-2012  DR McALHANY   LOW RISK NUCLEAR STUDY/  SMALL FIXED AREA OF MODERATELY DECREASED UPTAKE IN ANTEROSEPTAL WALL WHICH MAY BE ARTIFACTUAL/  NO ISCHEMIA/  EF 68%  . COLONOSCOPY  09-29-2010  . CYSTOSCOPY    . CYSTOSCOPY WITH HYDRODISTENSION AND BIOPSY N/A 03/06/2014   Procedure: CYSTOSCOPY/HYDRODISTENSION/  INSTILATION OF MARCAINE AND PYRIDIUM;  Surgeon: Ailene Rud, MD;  Location: [facility];  Service: Urology;  Laterality: N/A;  . ESOPHAGEAL MANOMETRY N/A 04/03/2016   Procedure: ESOPHAGEAL MANOMETRY (EM);  Surgeon: Manus Gunning, MD;  Location: [facility];  Service: Gastroenterology;  Laterality: N/A;  . EXTRACORPOREAL SHOCK WAVE LITHOTRIPSY Left 02/06/2019   Procedure: EXTRACORPOREAL SHOCK WAVE LITHOTRIPSY (ESWL);  Surgeon: Kathie Rhodes, MD;  Location: [facility];  Service: Urology;  Laterality: Left;  . NASAL SINUS SURGERY  1985  . ORIF RIGHT ANKLE  FX  2006  . POLYPECTOMY    . REMOVAL VOCAL CORD CYST  FEB 2014  . RIGHT BREAST BX  08-23-2012  . RIGHT HAND SURGERY  X3  LAST ONE 2009   INCLUDES  ORIF RIGHT 5TH FINGER AND REVISION TWICE  . SKIN CANCER EXCISION    . TONSILLECTOMY AND ADENOIDECTOMY  AGE 9  .  TOTAL ABDOMINAL HYSTERECTOMY W/ BILATERAL SALPINGOOPHORECTOMY  1982   W/  APPENDECTOMY  . TRANSTHORACIC ECHOCARDIOGRAM  06-24-2012   GRADE I DIASTOLIC DYSFUNCTION/  EF 55-60%/  MILD MR  . UPPER GASTROINTESTINAL ENDOSCOPY      There were no vitals filed for this visit.   Subjective Assessment - 07/16/20 1020    Subjective Pt reports she is not having a good day today - hurting all over, esp headache and LBP but also notes lateral hip pain limiting her tolerance for laying on her side while sleeping. Feels like her balance is getting bettter but still has trouble with quick turns.    Pertinent History Recurrent falls - 2 in past 6 months (past 5 weeks), 6 major falls in past 5-6 yrs (2 concussions, finger fracture); costochondritis, cellulitis of R breast, fibromyalgia, chronic LBP since ~age 67, DDD, sciatica, osteoporosis, degenerative scoliosis, peripheral neuropathy, vertigo, R knee pain, CAD, COPD, HTN, h/o lumpectomy for R breast cancer 2013, ORIF R ankle s/p fall 2006    Patient Stated Goals "Be able to gain some strength in my legs to help with my back. To be able stand w/o feeling like I'm swaying and walk w/o tripping."    Currently in Pain? Yes    Pain Score 6     Pain Location Head    Pain Descriptors / Indicators Aching    Pain Score 6    Pain Location Back    Pain Orientation Lower    Pain Descriptors / Indicators Stabbing    Pain Type Chronic pain    Pain Frequency Intermittent                             [facility] Adult PT Treatment/Exercise - 07/16/20 1017      Lumbar Exercises: Stretches   Passive Hamstring Stretch Right;Left;30 seconds;2 reps    Passive Hamstring Stretch Limitations supine with strap    Single Knee to Chest Stretch Right;Left;30 seconds;2 reps    Hip Flexor Stretch Right;Left;30 seconds;2 reps    Hip Flexor Stretch Limitations mod thomas with strap    ITB Stretch Right;Left;30 seconds;3 reps    ITB Stretch Limitations supine  cross-body with strap    Piriformis Stretch Right;Left;30 seconds;1 rep    Piriformis Stretch Limitations supine KTOS    Figure 4 Stretch 30 seconds;1 rep;Supine;With overpressure   bilateral     Knee/Hip Exercises: Aerobic   Recumbent Bike L2 x 6 min      Modalities   Modalities Iontophoresis  Iontophoresis   Type of Iontophoresis Dexamethasone    Location R greater trochanter    Dose 80 mA-min, 1.0 mL    Time 4-6 hr patch (#1 of 6)      Manual Therapy   Manual Therapy Taping    Kinesiotex Create Space      Kinesiotix   Create Space L greater trochanter: 30-50% star                   PT Education - 07/16/20 1102    Education Details HEP update - proximal LE stretching; Ionto patch instructions; Kinesiotape instructions    Person(s) Educated Patient    Methods Explanation;Demonstration;Verbal cues;Handout    Comprehension Verbalized understanding;Returned demonstration;Verbal cues required;Need further instruction            PT Short Term Goals - 06/22/20 1015      PT SHORT TERM GOAL #1   Title Patient will be independent with initial HEP    Status Achieved   06/22/20     PT SHORT TERM GOAL #2   Title Complete FGA assessment    Status Achieved   06/22/20            PT Long Term Goals - 07/16/20 1025      PT LONG TERM GOAL #1   Title Patient will be independent with ongoing/advanced HEP    Status On-going    Target Date 08/10/20      PT LONG TERM GOAL #2   Title Patient will demonstrate improved B LE strength to >/= 4/5 to 4+/5 for improved stability and ease of mobility    Status On-going    Target Date 08/10/20      PT LONG TERM GOAL #3   Title Patient will improve Berg score to >/= 52/56 to improve safety stability with ADLs in standing and reduce risk for falls    Status On-going    Target Date 08/10/20      PT LONG TERM GOAL #4   Title Patient will report no further instances of falls    Status Partially Met    Target Date 08/10/20       PT LONG TERM GOAL #5   Title Patient will improve FGA score to >/= 20/30 to improve gait stability and reduce risk for falls    Status On-going    Target Date 08/10/20                 Plan - 07/16/20 1102    Clinical Impression Statement [name] states she is not having a good day today due to pain "all over" especially in head (headache) and low back. Session initiated with stretches to help reduce muscle tension contributing to LBP during which pt also reported continued pain over B greater trochanter which prevents her from sleeping well due to inability to turn over on her side. Pt reporting decreased LBP following stretches but remains very ttp over B GT, therefore trialed ionto patch to R GT and Ktape for L GT with pt to report back what seemed to help best on next visit. Instructions for stretches added to HEP. Will plan to continue to address LBP and lateral hip pain as needed with emphasis on proximal and core flexibility and strengthening/stabilization before resuming balance training. She may also benefit from vestibular assessment/training to address reported unsteadiness/LOB with turning.    Comorbidities Recurrent falls - 2 in past 6 months (past 5 weeks), 6 major falls in past 5-6 yrs (2  concussions, finger fracture); costochondritis, cellulitis of R breast, fibromyalgia, chronic LBP since ~age 67, DDD, sciatica, osteoporosis, degenerative scoliosis, peripheral neuropathy, vertigo, R knee pain, CAD, COPD, HTN, h/o lumpectomy for R breast cancer 2013, ORIF R ankle s/p fall 2006    Rehab Potential Good    PT Frequency 2x / week    PT Duration 6 weeks    PT Treatment/Interventions ADLs/Self Care Home Management;Cryotherapy;Electrical Stimulation;Iontophoresis 44m/ml Dexamethasone;Moist Heat;Ultrasound;DME Instruction;Gait training;Stair training;Functional mobility training;Therapeutic activities;Therapeutic exercise;Balance training;Neuromuscular re-education;Patient/family  education;Manual techniques;Passive range of motion;Dry needling;Taping;Vasopneumatic Device;Vestibular;Visual/perceptual remediation/compensation;Spinal Manipulations    PT Next Visit Plan assess response to ionto patch to R GT & Ktape to L GT; progress LE strength, postural alignment for functional strengthening, continue balance training, manual therapy & modalities for pain including ionto patch as benefit noted for B hip/greater trochanter pain    PT Home Exercise Plan 6/24 - supported squat/eccentric stand>sit with yellow TB hip ABD isometric, seated yellow TB clam & marching; 7/23 - piriformis, ITB, hip flexor & HS stretches    Consulted and Agree with Plan of Care Patient           Patient will benefit from skilled therapeutic intervention in order to improve the following deficits and impairments:  Abnormal gait, Decreased activity tolerance, Decreased balance, Decreased coordination, Decreased endurance, Decreased knowledge of precautions, Decreased mobility, Decreased safety awareness, Decreased strength, Difficulty walking, Dizziness, Increased fascial restricitons, Increased muscle spasms, Impaired perceived functional ability, Impaired flexibility, Improper body mechanics, Postural dysfunction, Pain  Visit Diagnosis: Repeated falls  Unsteadiness on feet  Muscle weakness (generalized)  Other abnormalities of gait and mobility     Problem List Patient Active Problem List   Diagnosis Date Noted  . OSA and COPD overlap syndrome ([place] 06/10/2020  . Serotonin withdrawal syndrome without complication 050/93/2671 . Recurrent falls 05/02/2020  . Statin intolerance 02/29/2020  . RLS (restless legs syndrome) 11/09/2019  . Kidney stone 02/26/2019  . Recurrent sinusitis 02/25/2019  . Hx of colonic polyp 02/25/2019  . DDD (degenerative disc disease), cervical 09/17/2018  . Degenerative scoliosis 04/22/2018  . Primary insomnia 06/25/2017  . Chronic interstitial cystitis 02/01/2017   . Cough 01/09/2017  . Right arm pain 07/20/2016  . Deviated septum 05/12/2016  . Nasal turbinate hypertrophy 05/12/2016  . Dysphagia   . Allergic rhinitis 01/25/2016  . Other and combined forms of senile cataract 07/15/2015  . Dyspnea   . Left hip pain 04/30/2015  . Sciatica 04/30/2015  . Knee pain, right 04/30/2015  . Bilateral carpal tunnel syndrome 03/04/2015  . Hyperlipidemia 03/01/2015  . Pulmonary nodules 02/12/2015  . External hemorrhoids 02/05/2015  . Chronic night sweats 01/08/2015  . Atypical chest pain 01/01/2015  . Renal insufficiency 07/16/2014  . Memory loss 05/22/2014  . Peripheral neuropathy 05/22/2014  . Abdominal pain 10/26/2013  . Headache 10/13/2013  . Arthralgia 08/18/2013  . ANA positive 07/29/2013  . Depression 02/05/2013  . Family history of malignant hyperthermia 02/05/2013  . Malignant neoplasm of lower-outer quadrant of right breast of female, estrogen receptor positive ([place] 01/03/2013  . Splenic lesion 09/02/2012  . Asthma, allergic 08/05/2012  . GERD (gastroesophageal reflux disease) 06/03/2012  . Edema 04/08/2012  . Stricture and stenosis of esophagus 10/19/2011  . Functional constipation 07/21/2011  . Nonspecific (abnormal) findings on radiological and other examination of biliary tract 07/21/2011  . Osteoporosis 04/17/2011  . Leg pain 02/24/2011  . FIBROCYSTIC BREAST DISEASE, HX OF 10/18/2010  . Essential hypertension 08/25/2010  . CAD in native artery 08/25/2010  .  TOBACCO ABUSE, HX OF 08/25/2010  . GENERALIZED ANXIETY DISORDER 08/03/2010  . Fibromyalgia 01/11/2010    Percival Spanish, PT, MPT 07/16/2020, 1:02 PM  [facility] [address]  [place], [place], [address] Phone: [phone]   Fax:  [phone]  Name: [name] MRN: [id number] Date of Birth: 1946/07/03

## 2020-07-16 NOTE — Patient Instructions (Signed)
Home exercise program created by Annie Paras, PT.  For questions, please contact Eliza Grissinger via phone at [phone] or email at [email or web address]  [facility] [address]  [place], [place], [address] Phone: [phone]   Fax:  [phone]       IONTOPHORESIS PATIENT PRECAUTIONS & CONTRAINDICATIONS:  . Redness under one or both electrodes can occur.  This characterized by a uniform redness that usually disappears within 12 hours of treatment. . Small pinhead size blisters may result in response to the drug.  Contact your physician if the problem persists more than 24 hours. . On rare occasions, iontophoresis therapy can result in temporary skin reactions such as rash, inflammation, irritation or burns.  The skin reactions may be the result of individual sensitivity to the ionic solution used, the condition of the skin at the start of treatment, reaction to the materials in the electrodes, allergies or sensitivity to dexamethasone, or a poor connection between the patch and your skin.  Discontinue using iontophoresis if you have any of these reactions and report to your therapist. . Remove the Patch or electrodes if you have any undue sensation of pain or burning during the treatment and report discomfort to your therapist. . Tell your Therapist if you have had known adverse reactions to the application of electrical current. Marland Kitchen Approximate treatment time is 4-6 hours.  Remove the patch after 6 hours. . The Patch can be worn during normal activity, however excessive motion where the electrodes have been placed can cause poor contact between the skin and the electrode or uneven electrical current resulting in greater risk of skin irritation. Marland Kitchen Keep out of the reach of children.   . DO NOT use if you have a cardiac pacemaker or any other electrically sensitive implanted device. . DO NOT use if you have a known  sensitivity to dexamethasone. . DO NOT use during Magnetic Resonance Imaging (MRI). . DO NOT use over broken or compromised skin (e.g. sunburn, cuts, or acne) due to the increased risk of skin reaction. . DO NOT SHAVE over the area to be treated:  To establish good contact between the Patch and the skin, excessive hair may be clipped. . DO NOT place the Patch or electrodes on or over your eyes, directly over your heart, or brain. . DO NOT reuse the Patch or electrodes as this may cause burns to occur.   For questions, please contact your therapist at:  [facility] [address]  [place], [place], [address] Phone: [phone]   Fax:  [phone]     Kinesiology tape  What is kinesiology tape?  There are many brands of kinesiology tape. KTape, [employer], [employer], Dynamic tape, to name a few.  It is an elasticized tape designed to support the body's natural healing process. This tape provides stability and support to muscles and joints without restricting motion.  It can also help decrease swelling in the area of application.  How does it work?  The tape microscopically lifts and decompresses the skin to allow for drainage of lymph (swelling) to flow away from area, reducing inflammation. The tape has the ability to help re-educate the neuromuscular system by targeting specific receptors in the skin. The presence of the tape increases the body's awareness of posture and body mechanics.  Do not use with:  . Open wounds . Skin lesions . Adhesive allergies  In some  rare cases, mild/moderate skin irritation can occur. This can include redness, itchiness, or hives. If this occurs, immediately remove tape and consult your primary care physician if symptoms are severe or do not resolve within 2 days.  Safe removal of the tape:  To remove tape safely, hold nearby skin with one hand and gentle roll tape down with other  hand. You can apply oil or conditioner to tape while in shower prior to removal to loosen adhesive. DO NOT swiftly rip tape off like a band-aid, as this could cause skin tears and additional skin irritation.     For questions, please contact your therapist at:  [facility] [address]  [place], [place], [address] Phone: [phone]   Fax:  [phone]

## 2020-07-21 ENCOUNTER — Other Ambulatory Visit: Payer: Self-pay

## 2020-07-21 ENCOUNTER — Ambulatory Visit: Payer: Medicare Other

## 2020-07-21 DIAGNOSIS — R2681 Unsteadiness on feet: Secondary | ICD-10-CM

## 2020-07-21 DIAGNOSIS — R296 Repeated falls: Secondary | ICD-10-CM | POA: Diagnosis not present

## 2020-07-21 DIAGNOSIS — M545 Low back pain: Secondary | ICD-10-CM | POA: Diagnosis not present

## 2020-07-21 DIAGNOSIS — R2689 Other abnormalities of gait and mobility: Secondary | ICD-10-CM

## 2020-07-21 DIAGNOSIS — H52223 Regular astigmatism, bilateral: Secondary | ICD-10-CM | POA: Diagnosis not present

## 2020-07-21 DIAGNOSIS — M6281 Muscle weakness (generalized): Secondary | ICD-10-CM | POA: Diagnosis not present

## 2020-07-21 DIAGNOSIS — R42 Dizziness and giddiness: Secondary | ICD-10-CM | POA: Diagnosis not present

## 2020-07-21 DIAGNOSIS — G8929 Other chronic pain: Secondary | ICD-10-CM | POA: Diagnosis not present

## 2020-07-21 NOTE — Therapy (Signed)
[facility] [address]  [place] [address], [place], [address] Phone: [phone]   Fax:  [phone]  Physical Therapy Treatment  Patient Details  Name: [name] MRN: [id number] Date of Birth: 1946/04/09 Referring Provider (PT): [name], MD   Encounter Date: 07/21/2020   PT End of Session - 07/21/20 1025    Visit Number 9    Number of Visits 16    Date for PT Re-Evaluation 08/10/20    Authorization Type [employer]    PT Start Time 1023    PT Stop Time 1101    PT Time Calculation (min) 38 min    Activity Tolerance Patient tolerated treatment well    Behavior During Therapy [facility] for tasks assessed/performed           Past Medical History:  Diagnosis Date  . Allergy   . Anemia   . Anxiety    past hx   . Arthritis   . Asthma   . Atrial flutter ([place])    past history- not current  . CAD (coronary artery disease) CARDIOLOGIST--  DR Angelena Form   mild non-obstructive cad  . Cancer ([place])    right  . Cataract    bilaterally removed   . Chronic constipation   . Chronic kidney disease    interstitial cystitis  . COPD (chronic obstructive pulmonary disease) ([place])   . Depression    past hx   . Family history of malignant hyperthermia    father had this  . Fibromyalgia   . Frequency of urination   . GERD (gastroesophageal reflux disease)   . H/O hiatal hernia   . History of basal cell carcinoma excision    X2  . History of breast cancer ONCOLOGIST-- DR Jana Hakim---  NO RECURRANCE   DX 07/2012;  LOW GRADE DCIS  ER+PR+  ----  S/P RIGHT LUMPECTOMY WITH NEGATIVE MARGINS/   RADIATION ENDED 11/2012  . History of chronic bronchitis   . History of colonic polyps   . History of tachycardia    CONTROLLED  WITH ATENOLOL  . Hyperlipidemia   . Hypertension   . Neuromuscular disorder (HCC)    fibromyalgia  . Osteoporosis 01/2019   T score -2.2 stable/improved from prior study  . Pelvic pain   . Personal history  of radiation therapy   . S/P radiation therapy 11/12/12 - 12/05/12   right Breast  . Sepsis ([place]) 2014   from UTI   . Sinus headache   . Urgency of urination     Past Surgical History:  Procedure Laterality Date  . [place] STUDY N/A 04/03/2016   Procedure: [place] STUDY;  Surgeon: Manus Gunning, MD;  Location: [facility];  Service: Gastroenterology;  Laterality: N/A;  . BREAST LUMPECTOMY Right 10-11-2012   W/ SLN BX  . CARDIAC CATHETERIZATION  09-13-2007  DR Lia Foyer   WELL-PRESERVED LVF/  DIFFUSE SCATTERED CORONARY CALCIFACATION AND ATHEROSCLEROSIS WITHOUT OBSTRUCTION  . CARDIAC CATHETERIZATION  08-04-2010  DR Angelena Form   NON-OBSTRUCTIVE CAD/  pLAD 40%/  oLAD 30%/  mLAD 30%/  pRCA 30%/  EF 60%  . CARDIOVASCULAR STRESS TEST  06-18-2012  DR McALHANY   LOW RISK NUCLEAR STUDY/  SMALL FIXED AREA OF MODERATELY DECREASED UPTAKE IN ANTEROSEPTAL WALL WHICH MAY BE ARTIFACTUAL/  NO ISCHEMIA/  EF 68%  . COLONOSCOPY  09-29-2010  . CYSTOSCOPY    . CYSTOSCOPY WITH HYDRODISTENSION AND BIOPSY N/A 03/06/2014   Procedure: CYSTOSCOPY/HYDRODISTENSION/  INSTILATION OF MARCAINE AND PYRIDIUM;  Surgeon: Ailene Rud, MD;  Location: [facility];  Service: Urology;  Laterality: N/A;  . ESOPHAGEAL MANOMETRY N/A 04/03/2016   Procedure: ESOPHAGEAL MANOMETRY (EM);  Surgeon: Manus Gunning, MD;  Location: [facility];  Service: Gastroenterology;  Laterality: N/A;  . EXTRACORPOREAL SHOCK WAVE LITHOTRIPSY Left 02/06/2019   Procedure: EXTRACORPOREAL SHOCK WAVE LITHOTRIPSY (ESWL);  Surgeon: Kathie Rhodes, MD;  Location: [facility];  Service: Urology;  Laterality: Left;  . NASAL SINUS SURGERY  1985  . ORIF RIGHT ANKLE  FX  2006  . POLYPECTOMY    . REMOVAL VOCAL CORD CYST  FEB 2014  . RIGHT BREAST BX  08-23-2012  . RIGHT HAND SURGERY  X3  LAST ONE 2009   INCLUDES  ORIF RIGHT 5TH FINGER AND REVISION TWICE  . SKIN CANCER EXCISION    . TONSILLECTOMY AND ADENOIDECTOMY  AGE 45  .  TOTAL ABDOMINAL HYSTERECTOMY W/ BILATERAL SALPINGOOPHORECTOMY  1982   W/  APPENDECTOMY  . TRANSTHORACIC ECHOCARDIOGRAM  06-24-2012   GRADE I DIASTOLIC DYSFUNCTION/  EF 55-60%/  MILD MR  . UPPER GASTROINTESTINAL ENDOSCOPY      There were no vitals filed for this visit.   Subjective Assessment - 07/21/20 1032    Subjective notes benefit from ionto patch and taping with good resolution of hip pain and improved LBP.    Pertinent History Recurrent falls - 2 in past 6 months (past 5 weeks), 6 major falls in past 5-6 yrs (2 concussions, finger fracture); costochondritis, cellulitis of R breast, fibromyalgia, chronic LBP since ~age 48, DDD, sciatica, osteoporosis, degenerative scoliosis, peripheral neuropathy, vertigo, R knee pain, CAD, COPD, HTN, h/o lumpectomy for R breast cancer 2013, ORIF R ankle s/p fall 2006    Patient Stated Goals "Be able to gain some strength in my legs to help with my back. To be able stand w/o feeling like I'm swaying and walk w/o tripping."    Currently in Pain? --    Pain Score --    Pain Score 3    Pain Location Back    Pain Orientation Left;Lower    Pain Descriptors / Indicators Stabbing    Pain Type Chronic pain    Pain Radiating Towards into L buttocks    Pain Relieving Factors taping and ionto patch                             [facility] Adult PT Treatment/Exercise - 07/21/20 0001      Lumbar Exercises: Stretches   Passive Hamstring Stretch Right;Left;30 seconds;2 reps    Passive Hamstring Stretch Limitations supine with strap    Single Knee to Chest Stretch Right;Left;30 seconds;1 rep    Single Knee to Chest Stretch Limitations B supine     Lower Trunk Rotation Limitations 5" x 10     Piriformis Stretch Right;Left;30 seconds;1 rep    Piriformis Stretch Limitations supine KTOS    Figure 4 Stretch 30 seconds;1 rep;Supine;With overpressure    Figure 4 Stretch Limitations B      Knee/Hip Exercises: Aerobic   Recumbent Bike L2 x 6 min       Knee/Hip Exercises: Supine   Bridges Both;10 reps    Bridges Limitations 3" hold     Other Supine Knee/Hip Exercises Alternating clam shell in hookling yellow TB at knees x 10       Iontophoresis   Type of Iontophoresis Dexamethasone    Location  R greater trochanter    Dose 80 mA-min, 1.0 mL    Time 4-6 hr patch (#2 of 6)                  PT Education - 07/21/20 1206    Education Details HEP update; yellow TB hooklying clam shell    Person(s) Educated Patient    Methods Explanation;Demonstration;Verbal cues;Handout    Comprehension Verbalized understanding;Returned demonstration;Verbal cues required            PT Short Term Goals - 06/22/20 1015      PT SHORT TERM GOAL #1   Title Patient will be independent with initial HEP    Status Achieved   06/22/20     PT SHORT TERM GOAL #2   Title Complete FGA assessment    Status Achieved   06/22/20            PT Long Term Goals - 07/16/20 1025      PT LONG TERM GOAL #1   Title Patient will be independent with ongoing/advanced HEP    Status On-going    Target Date 08/10/20      PT LONG TERM GOAL #2   Title Patient will demonstrate improved B LE strength to >/= 4/5 to 4+/5 for improved stability and ease of mobility    Status On-going    Target Date 08/10/20      PT LONG TERM GOAL #3   Title Patient will improve Berg score to >/= 52/56 to improve safety stability with ADLs in standing and reduce risk for falls    Status On-going    Target Date 08/10/20      PT LONG TERM GOAL #4   Title Patient will report no further instances of falls    Status Partially Met    Target Date 08/10/20      PT LONG TERM GOAL #5   Title Patient will improve FGA score to >/= 20/30 to improve gait stability and reduce risk for falls    Status On-going    Target Date 08/10/20                 Plan - 07/21/20 1025    Clinical Impression Statement Pt. reporting good resolution of R hip pain flare-up and LBP after last  session.  Unsure of whether K-taping or ionto patch helped most.  Did reapply ionto patch #2/6 to R hip at [facility] as pt. still ttp with no visible skin irritation and pt. verbalizing good understanding of proper wear time.  Session focused on LE flexibility and proximal hip strengthening activities which were tolerate well without increased pain.  Pt. feels recent pain flare-up is most connected to long car ride and expressed concern over upcoming trip to [place].  Will continue to progress proximal hip/LE strengthening for hopeful improvement in hip stability in coming sessions.    Comorbidities Recurrent falls - 2 in past 6 months (past 5 weeks), 6 major falls in past 5-6 yrs (2 concussions, finger fracture); costochondritis, cellulitis of R breast, fibromyalgia, chronic LBP since ~age 53, DDD, sciatica, osteoporosis, degenerative scoliosis, peripheral neuropathy, vertigo, R knee pain, CAD, COPD, HTN, h/o lumpectomy for R breast cancer 2013, ORIF R ankle s/p fall 2006    Rehab Potential Good    PT Frequency 2x / week    PT Duration 6 weeks    PT Treatment/Interventions ADLs/Self Care Home Management;Cryotherapy;Electrical Stimulation;Iontophoresis '4mg'$ /ml Dexamethasone;Moist Heat;Ultrasound;DME Instruction;Gait training;Stair training;Functional mobility training;Therapeutic activities;Therapeutic exercise;Balance training;Neuromuscular re-education;Patient/family education;Manual  techniques;Passive range of motion;Dry needling;Taping;Vasopneumatic Device;Vestibular;Visual/perceptual remediation/compensation;Spinal Manipulations    PT Next Visit Plan Progress LE strength, postural alignment for functional strengthening, continue balance training, manual therapy & modalities for pain including ionto patch as benefit noted for B hip/greater trochanter pain    PT Home Exercise Plan 6/24 - supported squat/eccentric stand>sit with yellow TB hip ABD isometric, seated yellow TB clam & marching; 7/23 - piriformis, ITB,  hip flexor & HS stretches; 7/28 - yellow TBhooklying clm shell    Consulted and Agree with Plan of Care Patient           Patient will benefit from skilled therapeutic intervention in order to improve the following deficits and impairments:  Abnormal gait, Decreased activity tolerance, Decreased balance, Decreased coordination, Decreased endurance, Decreased knowledge of precautions, Decreased mobility, Decreased safety awareness, Decreased strength, Difficulty walking, Dizziness, Increased fascial restricitons, Increased muscle spasms, Impaired perceived functional ability, Impaired flexibility, Improper body mechanics, Postural dysfunction, Pain  Visit Diagnosis: Repeated falls  Unsteadiness on feet  Muscle weakness (generalized)  Other abnormalities of gait and mobility     Problem List Patient Active Problem List   Diagnosis Date Noted  . OSA and COPD overlap syndrome ([place]) 06/10/2020  . Serotonin withdrawal syndrome without complication 56/25/6389  . Recurrent falls 05/02/2020  . Statin intolerance 02/29/2020  . RLS (restless legs syndrome) 11/09/2019  . Kidney stone 02/26/2019  . Recurrent sinusitis 02/25/2019  . Hx of colonic polyp 02/25/2019  . DDD (degenerative disc disease), cervical 09/17/2018  . Degenerative scoliosis 04/22/2018  . Primary insomnia 06/25/2017  . Chronic interstitial cystitis 02/01/2017  . Cough 01/09/2017  . Right arm pain 07/20/2016  . Deviated septum 05/12/2016  . Nasal turbinate hypertrophy 05/12/2016  . Dysphagia   . Allergic rhinitis 01/25/2016  . Other and combined forms of senile cataract 07/15/2015  . Dyspnea   . Left hip pain 04/30/2015  . Sciatica 04/30/2015  . Knee pain, right 04/30/2015  . Bilateral carpal tunnel syndrome 03/04/2015  . Hyperlipidemia 03/01/2015  . Pulmonary nodules 02/12/2015  . External hemorrhoids 02/05/2015  . Chronic night sweats 01/08/2015  . Atypical chest pain 01/01/2015  . Renal insufficiency  07/16/2014  . Memory loss 05/22/2014  . Peripheral neuropathy 05/22/2014  . Abdominal pain 10/26/2013  . Headache 10/13/2013  . Arthralgia 08/18/2013  . ANA positive 07/29/2013  . Depression 02/05/2013  . Family history of malignant hyperthermia 02/05/2013  . Malignant neoplasm of lower-outer quadrant of right breast of female, estrogen receptor positive ([place]) 01/03/2013  . Splenic lesion 09/02/2012  . Asthma, allergic 08/05/2012  . GERD (gastroesophageal reflux disease) 06/03/2012  . Edema 04/08/2012  . Stricture and stenosis of esophagus 10/19/2011  . Functional constipation 07/21/2011  . Nonspecific (abnormal) findings on radiological and other examination of biliary tract 07/21/2011  . Osteoporosis 04/17/2011  . Leg pain 02/24/2011  . FIBROCYSTIC BREAST DISEASE, HX OF 10/18/2010  . Essential hypertension 08/25/2010  . CAD in native artery 08/25/2010  . TOBACCO ABUSE, HX OF 08/25/2010  . GENERALIZED ANXIETY DISORDER 08/03/2010  . Fibromyalgia 01/11/2010    Bess Harvest, PTA 07/21/20 12:07 PM    [facility] [address]  [place], [place], [address] Phone: [phone]   Fax:  [phone]  Name: ANOKHI SHANNON MRN: [id number] Date of Birth: [birth year]

## 2020-07-23 ENCOUNTER — Encounter: Payer: Self-pay | Admitting: Physical Therapy

## 2020-07-23 ENCOUNTER — Other Ambulatory Visit: Payer: Self-pay

## 2020-07-23 ENCOUNTER — Ambulatory Visit: Payer: Medicare Other | Admitting: Physical Therapy

## 2020-07-23 DIAGNOSIS — R296 Repeated falls: Secondary | ICD-10-CM

## 2020-07-23 DIAGNOSIS — R42 Dizziness and giddiness: Secondary | ICD-10-CM

## 2020-07-23 DIAGNOSIS — M6281 Muscle weakness (generalized): Secondary | ICD-10-CM

## 2020-07-23 DIAGNOSIS — R2689 Other abnormalities of gait and mobility: Secondary | ICD-10-CM

## 2020-07-23 DIAGNOSIS — R2681 Unsteadiness on feet: Secondary | ICD-10-CM | POA: Diagnosis not present

## 2020-07-23 DIAGNOSIS — M545 Low back pain: Secondary | ICD-10-CM | POA: Diagnosis not present

## 2020-07-23 DIAGNOSIS — G8929 Other chronic pain: Secondary | ICD-10-CM

## 2020-07-23 NOTE — Therapy (Signed)
[facility] [address]  [address] [place], [place], [address] Phone: [phone]   Fax:  [phone]  Physical Therapy Treatment / Progress Note  Patient Details  Name: [name] MRN: [id number] Date of Birth: 12/04/1946 Referring Provider (PT): Danise Edge, MD  Progress Note  Reporting Period 06/22/2020 to 07/23/2020  See note below for Objective Data and Assessment of Progress/Goals.      Encounter Date: 07/23/2020   PT End of Session - 07/23/20 1017    Visit Number 10    Number of Visits 16    Date for PT Re-Evaluation 08/10/20    Authorization Type [employer]    PT Start Time 1017    PT Stop Time 1112    PT Time Calculation (min) 55 min    Activity Tolerance Patient tolerated treatment well    Behavior During Therapy WFL for tasks assessed/performed           Past Medical History:  Diagnosis Date  . Allergy   . Anemia   . Anxiety    past hx   . Arthritis   . Asthma   . Atrial flutter (HCC)    past history- not current  . CAD (coronary artery disease) CARDIOLOGIST--  DR Clifton James   mild non-obstructive cad  . Cancer (HCC)    right  . Cataract    bilaterally removed   . Chronic constipation   . Chronic kidney disease    interstitial cystitis  . COPD (chronic obstructive pulmonary disease) (HCC)   . Depression    past hx   . Family history of malignant hyperthermia    father had this  . Fibromyalgia   . Frequency of urination   . GERD (gastroesophageal reflux disease)   . H/O hiatal hernia   . History of basal cell carcinoma excision    X2  . History of breast cancer ONCOLOGIST-- DR Darnelle Catalan---  NO RECURRANCE   DX 07/2012;  LOW GRADE DCIS  ER+PR+  ----  S/P RIGHT LUMPECTOMY WITH NEGATIVE MARGINS/   RADIATION ENDED 11/2012  . History of chronic bronchitis   . History of colonic polyps   . History of tachycardia    CONTROLLED  WITH ATENOLOL  . Hyperlipidemia   . Hypertension   .  Neuromuscular disorder (HCC)    fibromyalgia  . Osteoporosis 01/2019   T score -2.2 stable/improved from prior study  . Pelvic pain   . Personal history of radiation therapy   . S/P radiation therapy 11/12/12 - 12/05/12   right Breast  . Sepsis (HCC) 2014   from UTI   . Sinus headache   . Urgency of urination     Past Surgical History:  Procedure Laterality Date  . 24 HOUR PH STUDY N/A 04/03/2016   Procedure: 24 HOUR PH STUDY;  Surgeon: Ruffin Frederick, MD;  Location: [facility];  Service: Gastroenterology;  Laterality: N/A;  . BREAST LUMPECTOMY Right 10-11-2012   W/ SLN BX  . CARDIAC CATHETERIZATION  09-13-2007  DR Riley Kill   WELL-PRESERVED LVF/  DIFFUSE SCATTERED CORONARY CALCIFACATION AND ATHEROSCLEROSIS WITHOUT OBSTRUCTION  . CARDIAC CATHETERIZATION  08-04-2010  DR Clifton James   NON-OBSTRUCTIVE CAD/  pLAD 40%/  oLAD 30%/  mLAD 30%/  pRCA 30%/  EF 60%  . CARDIOVASCULAR STRESS TEST  06-18-2012  DR McALHANY   LOW RISK NUCLEAR STUDY/  SMALL FIXED AREA OF MODERATELY DECREASED UPTAKE IN ANTEROSEPTAL WALL WHICH MAY BE ARTIFACTUAL/  NO ISCHEMIA/  EF 68%  . COLONOSCOPY  09-29-2010  . CYSTOSCOPY    . CYSTOSCOPY WITH HYDRODISTENSION AND BIOPSY N/A 03/06/2014   Procedure: CYSTOSCOPY/HYDRODISTENSION/ INSTILATION OF MARCAINE AND PYRIDIUM;  Surgeon: Ailene Rud, MD;  Location: [facility];  Service: Urology;  Laterality: N/A;  . ESOPHAGEAL MANOMETRY N/A 04/03/2016   Procedure: ESOPHAGEAL MANOMETRY (EM);  Surgeon: Manus Gunning, MD;  Location: [facility];  Service: Gastroenterology;  Laterality: N/A;  . EXTRACORPOREAL SHOCK WAVE LITHOTRIPSY Left 02/06/2019   Procedure: EXTRACORPOREAL SHOCK WAVE LITHOTRIPSY (ESWL);  Surgeon: Kathie Rhodes, MD;  Location: [facility];  Service: Urology;  Laterality: Left;  . NASAL SINUS SURGERY  1985  . ORIF RIGHT ANKLE  FX  2006  . POLYPECTOMY    . REMOVAL VOCAL CORD CYST  FEB 2014  . RIGHT BREAST BX  08-23-2012  . RIGHT HAND  SURGERY  X3  LAST ONE 2009   INCLUDES  ORIF RIGHT 5TH FINGER AND REVISION TWICE  . SKIN CANCER EXCISION    . TONSILLECTOMY AND ADENOIDECTOMY  AGE 80  . TOTAL ABDOMINAL HYSTERECTOMY W/ BILATERAL SALPINGOOPHORECTOMY  1982   W/  APPENDECTOMY  . TRANSTHORACIC ECHOCARDIOGRAM  06-24-2012   GRADE I DIASTOLIC DYSFUNCTION/  EF 55-60%/  MILD MR  . UPPER GASTROINTESTINAL ENDOSCOPY      There were no vitals filed for this visit.   Subjective Assessment - 07/23/20 1021    Subjective Pt reports "everything" hurts today. She does note benefit from ionto patches and taping with improving tolerance for laying on her sides while sleeping.    Pertinent History Recurrent falls - 2 in past 6 months (past 5 weeks), 6 major falls in past 5-6 yrs (2 concussions, finger fracture); costochondritis, cellulitis of R breast, fibromyalgia, chronic LBP since ~age 53, DDD, sciatica, osteoporosis, degenerative scoliosis, peripheral neuropathy, vertigo, R knee pain, CAD, COPD, HTN, h/o lumpectomy for R breast cancer 2013, ORIF R ankle s/p fall 2006    Patient Stated Goals "Be able to gain some strength in my legs to help with my back. To be able stand w/o feeling like I'm swaying and walk w/o tripping."    Currently in Pain? Yes    Pain Score 6    6-7/10   Pain Location Leg    Pain Orientation Right;Left;Anterior;Upper;Lower    Pain Descriptors / Indicators Burning    Pain Type Chronic pain    Pain Frequency Intermittent    Pain Score 7   7-8/10   Pain Location Back    Pain Orientation Lower    Pain Descriptors / Indicators Pressure;Tightness    Pain Type Chronic pain    Pain Frequency Intermittent              [facility] PT Assessment - 07/23/20 1017      Assessment   Medical Diagnosis Multiple falls    Referring Provider (PT) [name], MD    Hand Dominance Right    Next MD Visit 09/07/20      Strength   Right Hip Flexion 4-/5    Right Hip Extension 4/5    Right Hip External Rotation  4/5    Right Hip  Internal Rotation 4+/5    Right Hip ABduction 4/5    Right Hip ADduction 4/5    Left Hip Flexion 4-/5    Left Hip Extension 4/5    Left Hip External Rotation 4/5    Left Hip Internal Rotation 4/5   pain in L buttock   Left Hip  ABduction 4/5    Left Hip ADduction 4-/5    Right Knee Flexion 4+/5    Right Knee Extension 4+/5    Left Knee Flexion 4+/5    Left Knee Extension 4+/5    Right Ankle Dorsiflexion 4-/5    Right Ankle Plantar Flexion 4/5    Left Ankle Dorsiflexion 4-/5    Left Ankle Plantar Flexion 4/5      Ambulation/Gait   Gait velocity 3.63 ft/sec      Standardized Balance Assessment   10 Meter Walk 9.04 sec      Berg Balance Test   Sit to Stand Able to stand without using hands and stabilize independently    Standing Unsupported Able to stand safely 2 minutes    Sitting with Back Unsupported but Feet Supported on Floor or Stool Able to sit safely and securely 2 minutes    Stand to Sit Sits safely with minimal use of hands    Transfers Able to transfer safely, minor use of hands    Standing Unsupported with Eyes Closed Able to stand 10 seconds safely    Standing Unsupported with Feet Together Able to place feet together independently and stand 1 minute safely    From Standing, Reach Forward with Outstretched Arm Can reach confidently >25 cm (10")    From Standing Position, Pick up Object from Floor Able to pick up shoe safely and easily    From Standing Position, Turn to Look Behind Over each Shoulder Looks behind from both sides and weight shifts well    Turn 360 Degrees Able to turn 360 degrees safely one side only in 4 seconds or less    Standing Unsupported, Alternately Place Feet on Step/Stool Able to stand independently and complete 8 steps >20 seconds    Standing Unsupported, One Foot in Front Able to take small step independently and hold 30 seconds    Standing on One Leg Tries to lift leg/unable to hold 3 seconds but remains standing independently    Total Score  49    Berg comment: 46-51 = moderate fall risk (>50%)       Functional Gait  Assessment   Gait Level Surface Walks 20 ft in less than 7 sec but greater than 5.5 sec, uses assistive device, slower speed, mild gait deviations, or deviates 6-10 in outside of the 12 in walkway width.    Change in Gait Speed Able to smoothly change walking speed without loss of balance or gait deviation. Deviate no more than 6 in outside of the 12 in walkway width.    Gait with Horizontal Head Turns Performs head turns smoothly with slight change in gait velocity (eg, minor disruption to smooth gait path), deviates 6-10 in outside 12 in walkway width, or uses an assistive device.    Gait with Vertical Head Turns Performs task with slight change in gait velocity (eg, minor disruption to smooth gait path), deviates 6 - 10 in outside 12 in walkway width or uses assistive device    Gait and Pivot Turn Pivot turns safely within 3 sec and stops quickly with no loss of balance.    Step Over Obstacle Is able to step over one shoe box (4.5 in total height) but must slow down and adjust steps to clear box safely. May require verbal cueing.    Gait with Narrow Base of Support Ambulates less than 4 steps heel to toe or cannot perform without assistance.    Gait with Eyes Closed Walks 20  ft, slow speed, abnormal gait pattern, evidence for imbalance, deviates 10-15 in outside 12 in walkway width. Requires more than 9 sec to ambulate 20 ft.    Ambulating Backwards Walks 20 ft, slow speed, abnormal gait pattern, evidence for imbalance, deviates 10-15 in outside 12 in walkway width.    Steps Alternating feet, must use rail.    Total Score 17    FGA comment: < 19 = high risk fall                         [facility] Adult PT Treatment/Exercise - 07/23/20 1017      Knee/Hip Exercises: Aerobic   Recumbent Bike L2 x 6 min      Iontophoresis   Type of Iontophoresis Dexamethasone    Location R & L greater trochanter    Dose 80  mA-min, 1.0 mL    Time 4-6 hr patch (R - #3 of 6, L- #1 of 6)                    PT Short Term Goals - 06/22/20 1015      PT SHORT TERM GOAL #1   Title Patient will be independent with initial HEP    Status Achieved   06/22/20     PT SHORT TERM GOAL #2   Title Complete FGA assessment    Status Achieved   06/22/20            PT Long Term Goals - 07/23/20 1027      PT LONG TERM GOAL #1   Title Patient will be independent with ongoing/advanced HEP    Status Partially Met    Target Date 08/10/20      PT LONG TERM GOAL #2   Title Patient will demonstrate improved B LE strength to >/= 4/5 to 4+/5 for improved stability and ease of mobility    Status Partially Met    Target Date 08/10/20      PT LONG TERM GOAL #3   Title Patient will improve Berg score to >/= 52/56 to improve safety stability with ADLs in standing and reduce risk for falls    Status On-going    Target Date 08/10/20      PT LONG TERM GOAL #4   Title Patient will report no further instances of falls    Status Partially Met    Target Date 08/10/20      PT LONG TERM GOAL #5   Title Patient will improve FGA score to >/= 20/30 to improve gait stability and reduce risk for falls    Status On-going    Target Date 08/10/20                 Plan - 07/23/20 1112    Clinical Impression Statement [name] reports improvement in her hip pain along with overall strength and states she feels steadier on her feet with no falls since starting PT. Overall B LE strength now >4-/5, with many strength grades improved by 2-3 MMT grades. Balance per standardized testing revealing decreasing risk for falls with gait speed improved to 3.63 ft/sec, Berg improved from 44/56 to 49/56, and FGA improved 13/30 to 17/30. Back and leg pain continue to fluctuate in relation to her fibromyalgia but B lateral hip pain improving allowing for improved tolerance for sleeping on her sides. All STGs met and progress observed toward all  LTGs with LTGs #1, 2 and 4 partially met. Two weeks  remaining in current POC before pt will be leaving on 10-day vacation - pt would like to focus on getting her back pain better managed prior to her trip as they will be doing a lot of driving which often exacerbates her pain, and would like to plan to return to PT after her vacation to further address pain and balance issues.    Comorbidities Recurrent falls - 2 in past 6 months (past 5 weeks), 6 major falls in past 5-6 yrs (2 concussions, finger fracture); costochondritis, cellulitis of R breast, fibromyalgia, chronic LBP since ~age 32, DDD, sciatica, osteoporosis, degenerative scoliosis, peripheral neuropathy, vertigo, R knee pain, CAD, COPD, HTN, h/o lumpectomy for R breast cancer 2013, ORIF R ankle s/p fall 2006    Examination-Activity Limitations Bathing;Bend;Carry;Dressing;Lift;Locomotion Level;Stand    Examination-Participation Restrictions Church;Community Activity;Interpersonal Relationship;Laundry;Meal Prep;Shop;Yard Work    Rehab Potential Good    PT Frequency 2x / week    PT Duration 6 weeks    PT Treatment/Interventions ADLs/Self Care Home Management;Cryotherapy;Electrical Stimulation;Iontophoresis '4mg'$ /ml Dexamethasone;Moist Heat;Ultrasound;DME Instruction;Gait training;Stair training;Functional mobility training;Therapeutic activities;Therapeutic exercise;Balance training;Neuromuscular re-education;Patient/family education;Manual techniques;Passive range of motion;Dry needling;Taping;Vasopneumatic Device;Vestibular;Visual/perceptual remediation/compensation;Spinal Manipulations    PT Next Visit Plan lumbopelvic strengthening and stabilization; manual therapy & modalities for pain including ionto patch or taping as benefit noted for B hip/greater trochanter pain; balance training    PT Home Exercise Plan 6/24 - supported squat/eccentric stand>sit with yellow TB hip ABD isometric, seated yellow TB clam & marching; 7/23 - piriformis, ITB, hip  flexor & HS stretches; 7/28 - yellow TBhooklying clm shell    Consulted and Agree with Plan of Care Patient           Patient will benefit from skilled therapeutic intervention in order to improve the following deficits and impairments:  Abnormal gait, Decreased activity tolerance, Decreased balance, Decreased coordination, Decreased endurance, Decreased knowledge of precautions, Decreased mobility, Decreased safety awareness, Decreased strength, Difficulty walking, Dizziness, Increased fascial restricitons, Increased muscle spasms, Impaired perceived functional ability, Impaired flexibility, Improper body mechanics, Postural dysfunction, Pain  Visit Diagnosis: Repeated falls  Unsteadiness on feet  Muscle weakness (generalized)  Other abnormalities of gait and mobility  Dizziness and giddiness  Chronic bilateral low back pain without sciatica     Problem List Patient Active Problem List   Diagnosis Date Noted  . OSA and COPD overlap syndrome ([place]) 06/10/2020  . Serotonin withdrawal syndrome without complication 84/66/5993  . Recurrent falls 05/02/2020  . Statin intolerance 02/29/2020  . RLS (restless legs syndrome) 11/09/2019  . Kidney stone 02/26/2019  . Recurrent sinusitis 02/25/2019  . Hx of colonic polyp 02/25/2019  . DDD (degenerative disc disease), cervical 09/17/2018  . Degenerative scoliosis 04/22/2018  . Primary insomnia 06/25/2017  . Chronic interstitial cystitis 02/01/2017  . Cough 01/09/2017  . Right arm pain 07/20/2016  . Deviated septum 05/12/2016  . Nasal turbinate hypertrophy 05/12/2016  . Dysphagia   . Allergic rhinitis 01/25/2016  . Other and combined forms of senile cataract 07/15/2015  . Dyspnea   . Left hip pain 04/30/2015  . Sciatica 04/30/2015  . Knee pain, right 04/30/2015  . Bilateral carpal tunnel syndrome 03/04/2015  . Hyperlipidemia 03/01/2015  . Pulmonary nodules 02/12/2015  . External hemorrhoids 02/05/2015  . Chronic night sweats  01/08/2015  . Atypical chest pain 01/01/2015  . Renal insufficiency 07/16/2014  . Memory loss 05/22/2014  . Peripheral neuropathy 05/22/2014  . Abdominal pain 10/26/2013  . Headache 10/13/2013  . Arthralgia 08/18/2013  . ANA positive 07/29/2013  . Depression  02/05/2013  . Family history of malignant hyperthermia 02/05/2013  . Malignant neoplasm of lower-outer quadrant of right breast of female, estrogen receptor positive ([place]) 01/03/2013  . Splenic lesion 09/02/2012  . Asthma, allergic 08/05/2012  . GERD (gastroesophageal reflux disease) 06/03/2012  . Edema 04/08/2012  . Stricture and stenosis of esophagus 10/19/2011  . Functional constipation 07/21/2011  . Nonspecific (abnormal) findings on radiological and other examination of biliary tract 07/21/2011  . Osteoporosis 04/17/2011  . Leg pain 02/24/2011  . FIBROCYSTIC BREAST DISEASE, HX OF 10/18/2010  . Essential hypertension 08/25/2010  . CAD in native artery 08/25/2010  . TOBACCO ABUSE, HX OF 08/25/2010  . GENERALIZED ANXIETY DISORDER 08/03/2010  . Fibromyalgia 01/11/2010    Percival Spanish, PT, MPT 07/23/2020, 12:07 PM  [facility] [address]  [place], [place], [address] Phone: [phone]   Fax:  [phone]  Name: [name] MRN: [id number] Date of Birth: [birth year]

## 2020-07-28 ENCOUNTER — Ambulatory Visit: Payer: Medicare Other

## 2020-07-28 ENCOUNTER — Ambulatory Visit (INDEPENDENT_AMBULATORY_CARE_PROVIDER_SITE_OTHER): Payer: Medicare Other | Admitting: Internal Medicine

## 2020-07-28 ENCOUNTER — Encounter: Payer: Self-pay | Admitting: Internal Medicine

## 2020-07-28 ENCOUNTER — Other Ambulatory Visit: Payer: Self-pay

## 2020-07-28 VITALS — BP 130/79 | HR 73 | Temp 98.2°F | Resp 16 | Ht 65.0 in | Wt 135.2 lb

## 2020-07-28 DIAGNOSIS — R197 Diarrhea, unspecified: Secondary | ICD-10-CM | POA: Diagnosis not present

## 2020-07-28 DIAGNOSIS — R1032 Left lower quadrant pain: Secondary | ICD-10-CM | POA: Diagnosis not present

## 2020-07-28 LAB — CBC WITH DIFFERENTIAL/PLATELET
Basophils Absolute: 0.1 10*3/uL (ref 0.0–0.1)
Basophils Relative: 1 % (ref 0.0–3.0)
Eosinophils Absolute: 0.2 10*3/uL (ref 0.0–0.7)
Eosinophils Relative: 3.2 % (ref 0.0–5.0)
HCT: 38.3 % (ref 36.0–46.0)
Hemoglobin: 12.3 g/dL (ref 12.0–15.0)
Lymphocytes Relative: 19.1 % (ref 12.0–46.0)
Lymphs Abs: 1.4 10*3/uL (ref 0.7–4.0)
MCHC: 32 g/dL (ref 30.0–36.0)
MCV: 94 fl (ref 78.0–100.0)
Monocytes Absolute: 0.6 10*3/uL (ref 0.1–1.0)
Monocytes Relative: 8.9 % (ref 3.0–12.0)
Neutro Abs: 4.9 10*3/uL (ref 1.4–7.7)
Neutrophils Relative %: 67.8 % (ref 43.0–77.0)
Platelets: 292 10*3/uL (ref 150.0–400.0)
RBC: 4.08 Mil/uL (ref 3.87–5.11)
RDW: 15.4 % (ref 11.5–15.5)
WBC: 7.2 10*3/uL (ref 4.0–10.5)

## 2020-07-28 LAB — URINALYSIS, ROUTINE W REFLEX MICROSCOPIC
Bilirubin Urine: NEGATIVE
Hgb urine dipstick: NEGATIVE
Ketones, ur: NEGATIVE
Leukocytes,Ua: NEGATIVE
Nitrite: NEGATIVE
Specific Gravity, Urine: 1.02 (ref 1.000–1.030)
Total Protein, Urine: NEGATIVE
Urine Glucose: NEGATIVE
Urobilinogen, UA: 1 (ref 0.0–1.0)
pH: 6 (ref 5.0–8.0)

## 2020-07-28 LAB — POC URINALSYSI DIPSTICK (AUTOMATED)
Blood, UA: NEGATIVE
Glucose, UA: NEGATIVE
Ketones, UA: NEGATIVE
Leukocytes, UA: NEGATIVE
Nitrite, UA: NEGATIVE
Protein, UA: POSITIVE — AB
Spec Grav, UA: 1.025 (ref 1.010–1.025)
Urobilinogen, UA: 0.2 E.U./dL
pH, UA: 6 (ref 5.0–8.0)

## 2020-07-28 LAB — BASIC METABOLIC PANEL
BUN: 15 mg/dL (ref 6–23)
CO2: 30 mEq/L (ref 19–32)
Calcium: 9 mg/dL (ref 8.4–10.5)
Chloride: 104 mEq/L (ref 96–112)
Creatinine, Ser: 1.02 mg/dL (ref 0.40–1.20)
GFR: 52.82 mL/min — ABNORMAL LOW (ref >60.00)
Glucose, Bld: 93 mg/dL (ref 70–99)
Potassium: 4.7 mEq/L (ref 3.5–5.1)
Sodium: 139 mEq/L (ref 135–145)

## 2020-07-28 NOTE — Progress Notes (Signed)
Subjective:    Patient ID: [name], female    DOB: 1946/03/11, 74 y.o.   MRN: [id number]  DOS:  07/28/2020 Type of visit - description: Acute Symptoms of started 3 days ago with pain at the left   abdomen it was persistent and radiate to her back. At the same time developed diarrhea, multiple episodes, stools were either watery, loose even she saw some "pellets". Last episode of diarrhea was yesterday at 4 AM.  She had similar episodes before.  Today she is feeling better, is a still sore at the LLQ.    Review of Systems Reports she checked her temperature: T-max 99.7 Had some nausea, no vomiting, no blood in the stools. No dysuria or gross hematuria No GERD type of symptoms  Past Medical History:  Diagnosis Date  . Allergy   . Anemia   . Anxiety    past hx   . Arthritis   . Asthma   . Atrial flutter ([place])    past history- not current  . CAD (coronary artery disease) CARDIOLOGIST--  DR Angelena Form   mild non-obstructive cad  . Cancer ([place])    right  . Cataract    bilaterally removed   . Chronic constipation   . Chronic kidney disease    interstitial cystitis  . COPD (chronic obstructive pulmonary disease) ([place])   . Depression    past hx   . Family history of malignant hyperthermia    father had this  . Fibromyalgia   . Frequency of urination   . GERD (gastroesophageal reflux disease)   . H/O hiatal hernia   . History of basal cell carcinoma excision    X2  . History of breast cancer ONCOLOGIST-- DR Jana Hakim---  NO RECURRANCE   DX 07/2012;  LOW GRADE DCIS  ER+PR+  ----  S/P RIGHT LUMPECTOMY WITH NEGATIVE MARGINS/   RADIATION ENDED 11/2012  . History of chronic bronchitis   . History of colonic polyps   . History of tachycardia    CONTROLLED  WITH ATENOLOL  . Hyperlipidemia   . Hypertension   . Neuromuscular disorder ([facility])    fibromyalgia  . Osteoporosis 01/2019   T score -2.2 stable/improved from prior study  . Pelvic pain   . Personal history of  radiation therapy   . S/P radiation therapy 11/12/12 - 12/05/12   right Breast  . Sepsis ([place]) 2014   from UTI   . Sinus headache   . Urgency of urination     Past Surgical History:  Procedure Laterality Date  . [place] STUDY N/A 04/03/2016   Procedure: [place] STUDY;  Surgeon: Manus Gunning, MD;  Location: [facility];  Service: Gastroenterology;  Laterality: N/A;  . BREAST LUMPECTOMY Right 10-11-2012   W/ SLN BX  . CARDIAC CATHETERIZATION  09-13-2007  DR Lia Foyer   WELL-PRESERVED LVF/  DIFFUSE SCATTERED CORONARY CALCIFACATION AND ATHEROSCLEROSIS WITHOUT OBSTRUCTION  . CARDIAC CATHETERIZATION  08-04-2010  DR Angelena Form   NON-OBSTRUCTIVE CAD/  pLAD 40%/  oLAD 30%/  mLAD 30%/  pRCA 30%/  EF 60%  . CARDIOVASCULAR STRESS TEST  06-18-2012  DR McALHANY   LOW RISK NUCLEAR STUDY/  SMALL FIXED AREA OF MODERATELY DECREASED UPTAKE IN ANTEROSEPTAL WALL WHICH MAY BE ARTIFACTUAL/  NO ISCHEMIA/  EF 68%  . COLONOSCOPY  09-29-2010  . CYSTOSCOPY    . CYSTOSCOPY WITH HYDRODISTENSION AND BIOPSY N/A 03/06/2014   Procedure: CYSTOSCOPY/HYDRODISTENSION/ INSTILATION OF MARCAINE AND PYRIDIUM;  Surgeon: Shane Crutch  Gaynelle Arabian, MD;  [facility];  Service: Urology;  Laterality: N/A;  . ESOPHAGEAL MANOMETRY N/A 04/03/2016   Procedure: ESOPHAGEAL MANOMETRY (EM);  Surgeon: Manus Gunning, MD;  Location: [facility];  Service: Gastroenterology;  Laterality: N/A;  . EXTRACORPOREAL SHOCK WAVE LITHOTRIPSY Left 02/06/2019   Procedure: EXTRACORPOREAL SHOCK WAVE LITHOTRIPSY (ESWL);  Surgeon: Kathie Rhodes, MD;  Location: [facility];  Service: Urology;  Laterality: Left;  . NASAL SINUS SURGERY  1985  . ORIF RIGHT ANKLE  FX  2006  . POLYPECTOMY    . REMOVAL VOCAL CORD CYST  FEB 2014  . RIGHT BREAST BX  08-23-2012  . RIGHT HAND SURGERY  X3  LAST ONE 2009   INCLUDES  ORIF RIGHT 5TH FINGER AND REVISION TWICE  . SKIN CANCER EXCISION    . TONSILLECTOMY AND ADENOIDECTOMY  AGE 70  . TOTAL  ABDOMINAL HYSTERECTOMY W/ BILATERAL SALPINGOOPHORECTOMY  1982   W/  APPENDECTOMY  . TRANSTHORACIC ECHOCARDIOGRAM  06-24-2012   GRADE I DIASTOLIC DYSFUNCTION/  EF 55-60%/  MILD MR  . UPPER GASTROINTESTINAL ENDOSCOPY      Allergies as of 07/28/2020      Reactions   Clindamycin Hcl Shortness Of Breath, Rash   Penicillins Anaphylaxis   Prednisone Shortness Of Breath, Rash   Rosuvastatin Anaphylaxis   Baclofen Other (See Comments), Rash   Cortisone Other (See Comments)   Lincomycin Other (See Comments)   Codeine Hives, Other (See Comments)   headache   Erythromycin Base Other (See Comments)   other   Fluzone [flu Virus Vaccine] Other (See Comments)   Local reaction at the site   Haemophilus Influenzae Other (See Comments)   Local reaction at the site Local reaction at the site   Latex Hives   Pentazocine Lactate Other (See Comments)   HALLUCINATION   Pneumococcal Vaccine Polyvalent Hives, Swelling, Other (See Comments)   REACTION: redness, swelling, and hives at injection site   Tamoxifen Nausea And Vomiting, Other (See Comments)   HEADACHE      Medication List       Accurate as of July 28, 2020 11:42 AM. If you have any questions, ask your nurse or doctor.        acetaminophen 500 MG tablet Commonly known as: TYLENOL Take 1,000 mg by mouth every 6 (six) hours as needed for mild pain or headache.   albuterol 108 (90 Base) MCG/ACT inhaler Commonly known as: VENTOLIN HFA Inhale 2 puffs into the lungs every 4 (four) hours as needed.   amLODipine 10 MG tablet Commonly known as: NORVASC Take 1 tablet (10 mg total) by mouth daily.   aspirin EC 81 MG tablet Take 1 tablet (81 mg total) by mouth daily.   atenolol 50 MG tablet Commonly known as: TENORMIN Take 1 tablet (50 mg total) by mouth 2 (two) times daily.   benzonatate 100 MG capsule Commonly known as: TESSALON Take 1 capsule (100 mg total) by mouth 2 (two) times daily as needed for cough.   DULoxetine 60 MG  capsule Commonly known as: CYMBALTA Take 2 capsules by mouth once daily   Estradiol 10 MCG Tabs vaginal tablet Place 1 tablet (10 mcg total) vaginally 2 (two) times a week.   fluticasone 50 MCG/ACT nasal spray Commonly known as: FLONASE Place 2 sprays into both nostrils daily.   furosemide 20 MG tablet Commonly known as: LASIX Take 1 tablet (20 mg total) by mouth as needed for edema.   gabapentin 300 MG capsule Commonly known  as: NEURONTIN Take 300 mg by mouth. 1cap in the morning, 1cap at noon, and 2caps at bedtime   hyoscyamine 0.125 MG SL tablet Commonly known as: LEVSIN SL Place 1 tablet (0.125 mg total) under the tongue every 4 (four) hours as needed.   linaclotide 145 MCG Caps capsule Commonly known as: Linzess Take 1 capsule (145 mcg total) by mouth daily before breakfast.   loratadine 10 MG tablet Commonly known as: CLARITIN Take 1 tablet (10 mg total) by mouth daily.   losartan 100 MG tablet Commonly known as: COZAAR Take 1 tablet by mouth once daily   mupirocin ointment 2 % Commonly known as: Bactroban Apply thin film twice daily.   nitroGLYCERIN 0.4 MG SL tablet Commonly known as: NITROSTAT Place 1 tablet (0.4 mg total) under the tongue every 5 (five) minutes as needed for chest pain.   omeprazole 40 MG capsule Commonly known as: PRILOSEC Take 1 capsule by mouth twice daily   pentosan polysulfate 100 MG capsule Commonly known as: Elmiron Take 1 capsule (100 mg total) by mouth 3 (three) times daily. Reported on 01/05/2016 What changed:   when to take this  reasons to take this   pramipexole 0.125 MG tablet Commonly known as: Mirapex Take 1-2 tablets (0.125-0.25 mg total) by mouth 3 (three) times daily.   Prolia 60 MG/ML Sosy injection Generic drug: denosumab Inject 60 mg into the skin every 6 (six) months.   Repatha SureClick 979 MG/ML Soaj Generic drug: Evolocumab INJECT 140 MG SUBCUTANEOUSLY EVERY 14 DAYS   rOPINIRole 0.25 MG  tablet Commonly known as: REQUIP Take 1 tablet (0.25 mg total) by mouth 3 (three) times daily.   traZODone 100 MG tablet Commonly known as: DESYREL TAKE 1 TABLET BY MOUTH AT BEDTIME          Objective:   Physical Exam Abdominal:      BP 130/79 (BP Location: Left Arm, Patient Position: Sitting, Cuff Size: Small)   Pulse 73   Temp 98.2 F (36.8 C) (Oral)   Resp 16   Ht 5\' 5"  (1.651 m)   Wt 135 lb 4 oz (61.3 kg)   SpO2 98%   BMI 22.51 kg/m       General:   Well developed, NAD, BMI noted.  HEENT:  Normocephalic . Face symmetric, atraumatic Lungs:  CTA B Normal respiratory effort, no intercostal retractions, no accessory muscle use. Heart: RRR,  no murmur.  Abdomen:  Not distended, soft, minimal tenderness without mass or rebound at the left abdomen.  See graphic Skin: Not pale. Not jaundice Lower extremities: no pretibial edema bilaterally  Neurologic:  alert & oriented X3.  Speech normal, gait appropriate for age and unassisted Psych--  Cognition and judgment appear intact.  Cooperative with normal attention span and concentration.  Behavior appropriate. No anxious or depressed appearing.    Assessment     74 year old female, PMH includes CAD, HTN, asthma, Breast cancer, s/p R breast Bx and lumpectomy 2013, noninvasive, estrogen positive, XRT completed 12/05/2012, on multiple medications, present with   Diarrhea, LLQ abdominal pain: Symptoms started 3 days ago she is much better. Chart reviewed: History of hysterectomy and BSO. Sigmoidoscopy 03-2018, no diverticuli Colonoscopy 12/2015 no diverticuli Colonoscopy 11/2018: Polyps, no diverticula described. US abdomen 02-2020 for similar symptoms (LLQ abdominal pain and diarrhea) : essentially normal At this point, I doubt an acute abdomen, probably she had acute self resolving diarrhea associated with abdominal pain, doubt diverticulitis. Plan: CBC, BMP, Levsin as she is already doing,  call if not gradually  better, CT? UA urine culture  This visit occurred during the SARS-CoV-2 public health emergency.  Safety protocols were in place, including screening questions prior to the visit, additional usage of staff PPE, and extensive cleaning of exam room while observing appropriate contact time as indicated for disinfecting solutions.

## 2020-07-28 NOTE — Patient Instructions (Signed)
Drink plenty of fluids  Bland diet  Continue Levsin as needed  Call if symptoms not gradually better  Call if severe pain, fever, chills, increased abdominal pain, blood in the stools.  GO TO THE LAB : Get the blood work

## 2020-07-28 NOTE — Progress Notes (Signed)
Pre visit review using our clinic review tool, if applicable. No additional management support is needed unless otherwise documented below in the visit note. 

## 2020-07-29 ENCOUNTER — Other Ambulatory Visit: Payer: Self-pay | Admitting: Family Medicine

## 2020-07-29 LAB — URINE CULTURE
MICRO NUMBER:: 10786537
Result:: NO GROWTH
SPECIMEN QUALITY:: ADEQUATE

## 2020-07-30 ENCOUNTER — Encounter: Payer: Self-pay | Admitting: Physical Therapy

## 2020-07-30 ENCOUNTER — Ambulatory Visit: Payer: Medicare Other | Attending: Family Medicine | Admitting: Physical Therapy

## 2020-07-30 ENCOUNTER — Other Ambulatory Visit: Payer: Self-pay

## 2020-07-30 DIAGNOSIS — R42 Dizziness and giddiness: Secondary | ICD-10-CM | POA: Diagnosis not present

## 2020-07-30 DIAGNOSIS — R296 Repeated falls: Secondary | ICD-10-CM

## 2020-07-30 DIAGNOSIS — G8929 Other chronic pain: Secondary | ICD-10-CM | POA: Diagnosis not present

## 2020-07-30 DIAGNOSIS — M545 Low back pain: Secondary | ICD-10-CM | POA: Insufficient documentation

## 2020-07-30 DIAGNOSIS — R2681 Unsteadiness on feet: Secondary | ICD-10-CM | POA: Diagnosis not present

## 2020-07-30 DIAGNOSIS — R2689 Other abnormalities of gait and mobility: Secondary | ICD-10-CM

## 2020-07-30 DIAGNOSIS — M6281 Muscle weakness (generalized): Secondary | ICD-10-CM | POA: Insufficient documentation

## 2020-07-30 NOTE — Therapy (Signed)
[facility] [address]  [place], [place], [address] Phone: [phone]   Fax:  [phone]  Physical Therapy Treatment  Patient Details  Name: [name] MRN: [id number] Date of Birth: 05-Nov-1946 Referring Provider (PT): [name], MD   Encounter Date: 07/30/2020   PT End of Session - 07/30/20 1022    Visit Number 11    Number of Visits 16    Date for PT Re-Evaluation 08/10/20    Authorization Type [employer]    PT Start Time 1022    PT Stop Time 1105    PT Time Calculation (min) 43 min    Activity Tolerance Patient tolerated treatment well    Behavior During Therapy [facility] for tasks assessed/performed           Past Medical History:  Diagnosis Date  . Allergy   . Anemia   . Anxiety    past hx   . Arthritis   . Asthma   . Atrial flutter ([place])    past history- not current  . CAD (coronary artery disease) CARDIOLOGIST--  DR Angelena Form   mild non-obstructive cad  . Cancer ([place])    right  . Cataract    bilaterally removed   . Chronic constipation   . Chronic kidney disease    interstitial cystitis  . COPD (chronic obstructive pulmonary disease) ([place])   . Depression    past hx   . Family history of malignant hyperthermia    father had this  . Fibromyalgia   . Frequency of urination   . GERD (gastroesophageal reflux disease)   . H/O hiatal hernia   . History of basal cell carcinoma excision    X2  . History of breast cancer ONCOLOGIST-- DR Jana Hakim---  NO RECURRANCE   DX 07/2012;  LOW GRADE DCIS  ER+PR+  ----  S/P RIGHT LUMPECTOMY WITH NEGATIVE MARGINS/   RADIATION ENDED 11/2012  . History of chronic bronchitis   . History of colonic polyps   . History of tachycardia    CONTROLLED  WITH ATENOLOL  . Hyperlipidemia   . Hypertension   . Neuromuscular disorder (H[facility])    fibromyalgia  . Osteoporosis 01/2019   T score -2.2 stable/improved from prior study  . Pelvic pain   . Personal history  of radiation therapy   . S/P radiation therapy 11/12/12 - 12/05/12   right Breast  . Sepsis ([place]) 2014   from UTI   . Sinus headache   . Urgency of urination     Past Surgical History:  Procedure Laterality Date  . [place] STUDY N/A 04/03/2016   Procedure: [place] STUDY;  Surgeon: Manus Gunning, MD;  Location: [facility];  Service: Gastroenterology;  Laterality: N/A;  . BREAST LUMPECTOMY Right 10-11-2012   W/ SLN BX  . CARDIAC CATHETERIZATION  09-13-2007  DR Lia Foyer   WELL-PRESERVED LVF/  DIFFUSE SCATTERED CORONARY CALCIFACATION AND ATHEROSCLEROSIS WITHOUT OBSTRUCTION  . CARDIAC CATHETERIZATION  08-04-2010  DR Angelena Form   NON-OBSTRUCTIVE CAD/  pLAD 40%/  oLAD 30%/  mLAD 30%/  pRCA 30%/  EF 60%  . CARDIOVASCULAR STRESS TEST  06-18-2012  DR McALHANY   LOW RISK NUCLEAR STUDY/  SMALL FIXED AREA OF MODERATELY DECREASED UPTAKE IN ANTEROSEPTAL WALL WHICH MAY BE ARTIFACTUAL/  NO ISCHEMIA/  EF 68%  . COLONOSCOPY  09-29-2010  . CYSTOSCOPY    . CYSTOSCOPY WITH HYDRODISTENSION AND BIOPSY N/A 03/06/2014   Procedure: CYSTOSCOPY/HYDRODISTENSION/  INSTILATION OF MARCAINE AND PYRIDIUM;  Surgeon: Ailene Rud, MD;  Location: [facility];  Service: Urology;  Laterality: N/A;  . ESOPHAGEAL MANOMETRY N/A 04/03/2016   Procedure: ESOPHAGEAL MANOMETRY (EM);  Surgeon: Manus Gunning, MD;  Location: [facility];  Service: Gastroenterology;  Laterality: N/A;  . EXTRACORPOREAL SHOCK WAVE LITHOTRIPSY Left 02/06/2019   Procedure: EXTRACORPOREAL SHOCK WAVE LITHOTRIPSY (ESWL);  Surgeon: Kathie Rhodes, MD;  Location: [facility];  Service: Urology;  Laterality: Left;  . NASAL SINUS SURGERY  1985  . ORIF RIGHT ANKLE  FX  2006  . POLYPECTOMY    . REMOVAL VOCAL CORD CYST  FEB 2014  . RIGHT BREAST BX  08-23-2012  . RIGHT HAND SURGERY  X3  LAST ONE 2009   INCLUDES  ORIF RIGHT 5TH FINGER AND REVISION TWICE  . SKIN CANCER EXCISION    . TONSILLECTOMY AND ADENOIDECTOMY  AGE 74  .  TOTAL ABDOMINAL HYSTERECTOMY W/ BILATERAL SALPINGOOPHORECTOMY  1982   W/  APPENDECTOMY  . TRANSTHORACIC ECHOCARDIOGRAM  06-24-2012   GRADE I DIASTOLIC DYSFUNCTION/  EF 55-60%/  MILD MR  . UPPER GASTROINTESTINAL ENDOSCOPY      There were no vitals filed for this visit.   Subjective Assessment - 07/30/20 1025    Subjective Pt reports she saw the MD who thinks she might have a kidney stone. Notes her back is still really hurting but pain seems to have moved into her L buttock. She expresses worry about tolerating the car ride for her upcoming vacation travel.    Pertinent History Recurrent falls - 2 in past 6 months (past 5 weeks), 6 major falls in past 5-6 yrs (2 concussions, finger fracture); costochondritis, cellulitis of R breast, fibromyalgia, chronic LBP since ~age 44, DDD, sciatica, osteoporosis, degenerative scoliosis, peripheral neuropathy, vertigo, R knee pain, CAD, COPD, HTN, h/o lumpectomy for R breast cancer 2013, ORIF R ankle s/p fall 2006    Patient Stated Goals "Be able to gain some strength in my legs to help with my back. To be able stand w/o feeling like I'm swaying and walk w/o tripping."    Currently in Pain? Yes    Pain Score 5    4-5/10   Pain Location Buttocks    Pain Orientation Left    Pain Type Chronic pain    Pain Radiating Towards pain and weakness in B LEs    Pain Frequency Intermittent                             [facility] Adult PT Treatment/Exercise - 07/30/20 1022      Lumbar Exercises: Stretches   Hip Flexor Stretch Right;Left;30 seconds;2 reps    Hip Flexor Stretch Limitations mod thomas with strap      Knee/Hip Exercises: Aerobic   Recumbent Bike L2 x 6 min      Manual Therapy   Manual Therapy Soft tissue mobilization;Myofascial release    Manual therapy comments skilled palpation and monitoring during DN    Soft tissue mobilization STM/DTM to L glutes, pirformis and B lumbar parapsinals    Myofascial Release manual TPR to L glute  medius/minimus and piriformis, pin & stretch to B lumbar paraspinals            Trigger Point Dry Needling - 07/30/20 1022    Consent Given? Yes    Education Handout Provided Previously provided   pt familiar from prior PT episode   Muscles Treated Back/Hip  Gluteus minimus;Gluteus medius;Piriformis;Erector spinae;Lumbar multifidi   Lt   Gluteus Minimus Response Twitch response elicited;Palpable increased muscle length    Gluteus Medius Response Twitch response elicited;Palpable increased muscle length    Piriformis Response Twitch response elicited;Palpable increased muscle length    Erector spinae Response Twitch response elicited;Palpable increased muscle length    Lumbar multifidi Response Twitch response elicited;Palpable increased muscle length                PT Education - 07/30/20 1032    Education Details Review of role of DN    Person(s) Educated Patient    Methods Explanation    Comprehension Verbalized understanding            PT Short Term Goals - 06/22/20 1015      PT SHORT TERM GOAL #1   Title Patient will be independent with initial HEP    Status Achieved   06/22/20     PT SHORT TERM GOAL #2   Title Complete FGA assessment    Status Achieved   06/22/20            PT Long Term Goals - 07/23/20 1027      PT LONG TERM GOAL #1   Title Patient will be independent with ongoing/advanced HEP    Status Partially Met    Target Date 08/10/20      PT LONG TERM GOAL #2   Title Patient will demonstrate improved B LE strength to >/= 4/5 to 4+/5 for improved stability and ease of mobility    Status Partially Met    Target Date 08/10/20      PT LONG TERM GOAL #3   Title Patient will improve Berg score to >/= 52/56 to improve safety stability with ADLs in standing and reduce risk for falls    Status On-going    Target Date 08/10/20      PT LONG TERM GOAL #4   Title Patient will report no further instances of falls    Status Partially Met    Target Date  08/10/20      PT LONG TERM GOAL #5   Title Patient will improve FGA score to >/= 20/30 to improve gait stability and reduce risk for falls    Status On-going    Target Date 08/10/20                 Plan - 07/30/20 1030    Clinical Impression Statement [name] missed her last PT visit due to GI issues (vomiting and diarrhea) - states MD told her it was likely just a virus but also noted crystals in her urine which may be indicative of a kidney stone which could be contributing to her LBP. She notes ongoing increased LBP which seems more localized to her L buttock today with pain and weakness extending into her LEs which has her concerned regarding her tolerance for her upcoming vacation where she will be spending extended periods riding in a car to [place]. Pt very ttp with jump sign in response to palpation of L glutes and piriformis as well as B lumbar paraspinals (L>R) with increased muscle tension and TPs noted t/o these muscles. Discussion of treatment options revealed positive response to DN in a prior PT episode, therefore proceeded with MT incorporating DN upon informed pt consent - good twitch responses elicited with palpable reduction in muscle tension and ttp noted. Hip flexor stretch reviewed for clarification per pt request with pt encouraged to also perform stretches  targeting glutes/piriformis as well as self-STM using tennis ball on wall to further reduce pain and abnormal muscle tension. Will plan for further review and update of HEP next visit to hopefully allow pt to better manage her pain while on vacation.    Comorbidities Recurrent falls - 2 in past 6 months (past 5 weeks), 6 major falls in past 5-6 yrs (2 concussions, finger fracture); costochondritis, cellulitis of R breast, fibromyalgia, chronic LBP since ~age 84, DDD, sciatica, osteoporosis, degenerative scoliosis, peripheral neuropathy, vertigo, R knee pain, CAD, COPD, HTN, h/o lumpectomy for R breast cancer 2013, ORIF R  ankle s/p fall 2006    Examination-Activity Limitations Bathing;Bend;Carry;Dressing;Lift;Locomotion Level;Stand    Examination-Participation Restrictions Church;Community Activity;Interpersonal Relationship;Laundry;Meal Prep;Shop;Yard Work    Rehab Potential Good    PT Frequency 2x / week    PT Duration 6 weeks    PT Treatment/Interventions ADLs/Self Care Home Management;Cryotherapy;Electrical Stimulation;Iontophoresis 54m/ml Dexamethasone;Moist Heat;Ultrasound;DME Instruction;Gait training;Stair training;Functional mobility training;Therapeutic activities;Therapeutic exercise;Balance training;Neuromuscular re-education;Patient/family education;Manual techniques;Passive range of motion;Dry needling;Taping;Vasopneumatic Device;Vestibular;Visual/perceptual remediation/compensation;Spinal Manipulations    PT Next Visit Plan assess response to DN; lumbopelvic strengthening and stabilization - HEP review/update PRN; manual therapy & modalities for pain including ionto patch and/or taping as benefit noted for B hip/greater trochanter pain; balance training    PT Home Exercise Plan 6/24 - supported squat/eccentric stand>sit with yellow TB hip ABD isometric, seated yellow TB clam & marching; 7/23 - piriformis, ITB, hip flexor & HS stretches; 7/28 - yellow TBhooklying clm shell    Consulted and Agree with Plan of Care Patient           Patient will benefit from skilled therapeutic intervention in order to improve the following deficits and impairments:  Abnormal gait, Decreased activity tolerance, Decreased balance, Decreased coordination, Decreased endurance, Decreased knowledge of precautions, Decreased mobility, Decreased safety awareness, Decreased strength, Difficulty walking, Dizziness, Increased fascial restricitons, Increased muscle spasms, Impaired perceived functional ability, Impaired flexibility, Improper body mechanics, Postural dysfunction, Pain  Visit Diagnosis: Repeated falls  Unsteadiness  on feet  Muscle weakness (generalized)  Other abnormalities of gait and mobility  Dizziness and giddiness  Chronic bilateral low back pain without sciatica     Problem List Patient Active Problem List   Diagnosis Date Noted  . OSA and COPD overlap syndrome ([place] 06/10/2020  . Serotonin withdrawal syndrome without complication 011/[id number] . Recurrent falls 05/02/2020  . Statin intolerance 02/29/2020  . RLS (restless legs syndrome) 11/09/2019  . Kidney stone 02/26/2019  . Recurrent sinusitis 02/25/2019  . Hx of colonic polyp 02/25/2019  . DDD (degenerative disc disease), cervical 09/17/2018  . Degenerative scoliosis 04/22/2018  . Primary insomnia 06/25/2017  . Chronic interstitial cystitis 02/01/2017  . Cough 01/09/2017  . Right arm pain 07/20/2016  . Deviated septum 05/12/2016  . Nasal turbinate hypertrophy 05/12/2016  . Dysphagia   . Allergic rhinitis 01/25/2016  . Other and combined forms of senile cataract 07/15/2015  . Dyspnea   . Left hip pain 04/30/2015  . Sciatica 04/30/2015  . Knee pain, right 04/30/2015  . Bilateral carpal tunnel syndrome 03/04/2015  . Hyperlipidemia 03/01/2015  . Pulmonary nodules 02/12/2015  . External hemorrhoids 02/05/2015  . Chronic night sweats 01/08/2015  . Atypical chest pain 01/01/2015  . Renal insufficiency 07/16/2014  . Memory loss 05/22/2014  . Peripheral neuropathy 05/22/2014  . Abdominal pain 10/26/2013  . Headache 10/13/2013  . Arthralgia 08/18/2013  . ANA positive 07/29/2013  . Depression 02/05/2013  . Family history of malignant hyperthermia 02/05/2013  . Malignant  neoplasm of lower-outer quadrant of right breast of female, estrogen receptor positive ([place]) 01/03/2013  . Splenic lesion 09/02/2012  . Asthma, allergic 08/05/2012  . GERD (gastroesophageal reflux disease) 06/03/2012  . Edema 04/08/2012  . Stricture and stenosis of esophagus 10/19/2011  . Functional constipation 07/21/2011  . Nonspecific (abnormal)  findings on radiological and other examination of biliary tract 07/21/2011  . Osteoporosis 04/17/2011  . Leg pain 02/24/2011  . FIBROCYSTIC BREAST DISEASE, HX OF 10/18/2010  . Essential hypertension 08/25/2010  . CAD in native artery 08/25/2010  . TOBACCO ABUSE, HX OF 08/25/2010  . GENERALIZED ANXIETY DISORDER 08/03/2010  . Fibromyalgia 01/11/2010    Percival Spanish, PT, MPT 07/30/2020, 1:12 PM  [facility] [address]  [place], [place], [address] Phone: [phone]   Fax:  [phone]  Name: [name] MRN: [id number] Date of Birth: 06-Nov-1946

## 2020-07-30 NOTE — Patient Instructions (Signed)

## 2020-08-03 ENCOUNTER — Encounter: Payer: Self-pay | Admitting: Physical Therapy

## 2020-08-03 ENCOUNTER — Ambulatory Visit: Payer: Medicare Other | Admitting: Physical Therapy

## 2020-08-03 ENCOUNTER — Encounter: Payer: Self-pay | Admitting: Internal Medicine

## 2020-08-03 ENCOUNTER — Other Ambulatory Visit: Payer: Self-pay

## 2020-08-03 VITALS — BP 102/62 | HR 78

## 2020-08-03 DIAGNOSIS — R2681 Unsteadiness on feet: Secondary | ICD-10-CM

## 2020-08-03 DIAGNOSIS — R42 Dizziness and giddiness: Secondary | ICD-10-CM

## 2020-08-03 DIAGNOSIS — R2689 Other abnormalities of gait and mobility: Secondary | ICD-10-CM | POA: Diagnosis not present

## 2020-08-03 DIAGNOSIS — M6281 Muscle weakness (generalized): Secondary | ICD-10-CM

## 2020-08-03 DIAGNOSIS — G8929 Other chronic pain: Secondary | ICD-10-CM | POA: Diagnosis not present

## 2020-08-03 DIAGNOSIS — M545 Low back pain: Secondary | ICD-10-CM | POA: Diagnosis not present

## 2020-08-03 DIAGNOSIS — R296 Repeated falls: Secondary | ICD-10-CM | POA: Diagnosis not present

## 2020-08-03 NOTE — Therapy (Signed)
[facility] [address]  [place], [place], [address] Phone: [phone]   Fax:  [phone]  Physical Therapy Treatment  Patient Details  Name: [name] MRN: [id number] Date of Birth: Aug 11, 1946 Referring Provider (PT): [name], MD   Encounter Date: 08/03/2020   PT End of Session - 08/03/20 1421    Visit Number 12    Number of Visits 16    Date for PT Re-Evaluation 08/10/20    Authorization Type [employer]    PT Start Time 1314    PT Stop Time 1408    PT Time Calculation (min) 54 min    Activity Tolerance Patient tolerated treatment well;Other (comment)   dizziness   Behavior During Therapy WFL for tasks assessed/performed           Past Medical History:  Diagnosis Date  . Allergy   . Anemia   . Anxiety    past hx   . Arthritis   . Asthma   . Atrial flutter ([place])    past history- not current  . CAD (coronary artery disease) CARDIOLOGIST--  DR Angelena Form   mild non-obstructive cad  . Cancer ([place])    right  . Cataract    bilaterally removed   . Chronic constipation   . Chronic kidney disease    interstitial cystitis  . COPD (chronic obstructive pulmonary disease) ([place])   . Depression    past hx   . Family history of malignant hyperthermia    father had this  . Fibromyalgia   . Frequency of urination   . GERD (gastroesophageal reflux disease)   . H/O hiatal hernia   . History of basal cell carcinoma excision    X2  . History of breast cancer ONCOLOGIST-- DR Jana Hakim---  NO RECURRANCE   DX 07/2012;  LOW GRADE DCIS  ER+PR+  ----  S/P RIGHT LUMPECTOMY WITH NEGATIVE MARGINS/   RADIATION ENDED 11/2012  . History of chronic bronchitis   . History of colonic polyps   . History of tachycardia    CONTROLLED  WITH ATENOLOL  . Hyperlipidemia   . Hypertension   . Neuromuscular disorder (H[facility])    fibromyalgia  . Osteoporosis 01/2019   T score -2.2 stable/improved from prior study  . Pelvic  pain   . Personal history of radiation therapy   . S/P radiation therapy 11/12/12 - 12/05/12   right Breast  . Sepsis ([place]) 2014   from UTI   . Sinus headache   . Urgency of urination     Past Surgical History:  Procedure Laterality Date  . [place] STUDY N/A 04/03/2016   Procedure: [place] STUDY;  Surgeon: Manus Gunning, MD;  Location: [facility];  Service: Gastroenterology;  Laterality: N/A;  . BREAST LUMPECTOMY Right 10-11-2012   W/ SLN BX  . CARDIAC CATHETERIZATION  09-13-2007  DR Lia Foyer   WELL-PRESERVED LVF/  DIFFUSE SCATTERED CORONARY CALCIFACATION AND ATHEROSCLEROSIS WITHOUT OBSTRUCTION  . CARDIAC CATHETERIZATION  08-04-2010  DR Angelena Form   NON-OBSTRUCTIVE CAD/  pLAD 40%/  oLAD 30%/  mLAD 30%/  pRCA 30%/  EF 60%  . CARDIOVASCULAR STRESS TEST  06-18-2012  DR McALHANY   LOW RISK NUCLEAR STUDY/  SMALL FIXED AREA OF MODERATELY DECREASED UPTAKE IN ANTEROSEPTAL WALL WHICH MAY BE ARTIFACTUAL/  NO ISCHEMIA/  EF 68%  . COLONOSCOPY  09-29-2010  . CYSTOSCOPY    . CYSTOSCOPY WITH HYDRODISTENSION AND BIOPSY N/A 03/06/2014  Procedure: CYSTOSCOPY/HYDRODISTENSION/ INSTILATION OF MARCAINE AND PYRIDIUM;  Surgeon: Ailene Rud, MD;  Location: [facility];  Service: Urology;  Laterality: N/A;  . ESOPHAGEAL MANOMETRY N/A 04/03/2016   Procedure: ESOPHAGEAL MANOMETRY (EM);  Surgeon: Manus Gunning, MD;  Location: [facility];  Service: Gastroenterology;  Laterality: N/A;  . EXTRACORPOREAL SHOCK WAVE LITHOTRIPSY Left 02/06/2019   Procedure: EXTRACORPOREAL SHOCK WAVE LITHOTRIPSY (ESWL);  Surgeon: Kathie Rhodes, MD;  Location: [facility];  Service: Urology;  Laterality: Left;  . NASAL SINUS SURGERY  1985  . ORIF RIGHT ANKLE  FX  2006  . POLYPECTOMY    . REMOVAL VOCAL CORD CYST  FEB 2014  . RIGHT BREAST BX  08-23-2012  . RIGHT HAND SURGERY  X3  LAST ONE 2009   INCLUDES  ORIF RIGHT 5TH FINGER AND REVISION TWICE  . SKIN CANCER EXCISION    . TONSILLECTOMY AND  ADENOIDECTOMY  AGE 54  . TOTAL ABDOMINAL HYSTERECTOMY W/ BILATERAL SALPINGOOPHORECTOMY  1982   W/  APPENDECTOMY  . TRANSTHORACIC ECHOCARDIOGRAM  06-24-2012   GRADE I DIASTOLIC DYSFUNCTION/  EF 55-60%/  MILD MR  . UPPER GASTROINTESTINAL ENDOSCOPY      Vitals:   08/03/20 1322  BP: 102/62  Pulse: 78  SpO2: 95%     Subjective Assessment - 08/03/20 1315    Subjective Patient reporting that she has had abdominal issues that have continued on for the past 10 days and still unsure of its origin. Notes that dizziness started this AM when she opened her eyes and was looking at the light fixture- noticed that it was spinning. Had trouble getting dressed and showering d/t imbalance. Noticed a "bubble" out of the corner of her R eye  which came and went and feels that this is different from floaters she has had in the past. Denies N/T, weakness, dysphagia, dysarthria. Denies hearing loss or otalgia. Dizziness comes in waves, lasting minutes.    Pertinent History Recurrent falls - 2 in past 6 months (past 5 weeks), 6 major falls in past 5-6 yrs (2 concussions, finger fracture); costochondritis, cellulitis of R breast, fibromyalgia, chronic LBP since ~age 61, DDD, sciatica, osteoporosis, degenerative scoliosis, peripheral neuropathy, vertigo, R knee pain, CAD, COPD, HTN, h/o lumpectomy for R breast cancer 2013, ORIF R ankle s/p fall 2006    Limitations Lifting;Standing;Walking;House hold activities    Patient Stated Goals "Be able to gain some strength in my legs to help with my back. To be able stand w/o feeling like I'm swaying and walk w/o tripping."    Currently in Pain? Yes    Pain Score 5     Pain Location Back    Pain Orientation Left;Right    Pain Descriptors / Indicators Dull    Pain Type Chronic pain              [facility] PT Assessment - 08/03/20 0001      Ambulation/Gait   Assistive device Straight cane    Gait Pattern Step-through pattern;Decreased hip/knee flexion - right;Decreased  hip/knee flexion - left;Poor foot clearance - left;Poor foot clearance - right;Lateral hip instability;Scissoring    Ambulation Surface Level;Indoor    Gait velocity decreased               Vestibular Assessment - 08/03/20 1324      Oculomotor Exam   Oculomotor Alignment Normal    Ocular ROM intact    Spontaneous Absent    Gaze-induced  Absent    Smooth Pursuits Intact  c/o dizziness, worse in vertical direction   Saccades Undershoots   towards R; dizziness   Comment convergence insufficent in L eye- ~8 inches away      Oculomotor Exam-Fixation Suppressed    Left Head Impulse positive for corrective saccade   fixation not suppressed   Right Head Impulse negative   fixation not suppressed     Vestibulo-Ocular Reflex   VOR 1 Head Only (x 1 viewing) Increase in symptoms, worse to L    VOR Cancellation Corrective saccades   B but worse to L     Positional Testing   Dix-Hallpike Dix-Hallpike Right;Dix-Hallpike Left    Horizontal Canal Testing Horizontal Canal Right;Horizontal Canal Left      Dix-Hallpike Right   Dix-Hallpike Right Duration 10 sec    Dix-Hallpike Right Symptoms --   unable to discern direction of nystagmus, very slight moveme     Dix-Hallpike Left   Dix-Hallpike Left Duration 1 min    Dix-Hallpike Left Symptoms Upbeat, left rotatory nystagmus   c/o dizziness     Horizontal Canal Right   Horizontal Canal Right Duration none    Horizontal Canal Right Symptoms Normal   report of "bubble" in her vision which came and went quickly     Horizontal Canal Left   Horizontal Canal Left Duration 20 sec    Horizontal Canal Left Symptoms Geotrophic   mild dizziness                    Vestibular Treatment/Exercise - 08/03/20 0001      Vestibular Treatment/Exercise   Vestibular Treatment Provided Canalith Repositioning    Canalith Repositioning Epley Manuever Left       EPLEY MANUEVER LEFT   Number of Reps  1    Overall Response  Symptoms Resolved       RESPONSE DETAILS LEFT dizziness in 3rd position of maneuver, no dizziness upon sitting                 PT Education - 08/03/20 1421    Education Details edu on BPPV and post-epley instructions    Person(s) Educated Patient    Methods Explanation;Demonstration;Tactile cues;Verbal cues;Handout    Comprehension Verbalized understanding;Returned demonstration            PT Short Term Goals - 06/22/20 1015      PT SHORT TERM GOAL #1   Title Patient will be independent with initial HEP    Status Achieved   06/22/20     PT SHORT TERM GOAL #2   Title Complete FGA assessment    Status Achieved   06/22/20            PT Long Term Goals - 08/03/20 1439      PT LONG TERM GOAL #1   Title Patient will be independent with ongoing/advanced HEP    Status Partially Met      PT LONG TERM GOAL #2   Title Patient will demonstrate improved B LE strength to >/= 4/5 to 4+/5 for improved stability and ease of mobility    Status Partially Met      PT LONG TERM GOAL #3   Title Patient will improve Berg score to >/= 52/56 to improve safety stability with ADLs in standing and reduce risk for falls    Status On-going      PT LONG TERM GOAL #4   Title Patient will report no further instances of falls    Status Partially Met  PT LONG TERM GOAL #5   Title Patient will improve FGA score to >/= 20/30 to improve gait stability and reduce risk for falls    Status On-going      Additional Long Term Goals   Additional Long Term Goals Yes      PT LONG TERM GOAL #6   Title Patient to report no dizziness remaining with bed mobility.    Time 1    Period Weeks    Status New    Target Date 08/10/20                 Plan - 08/03/20 1437    Clinical Impression Statement Patient arrived to session for assessment of acute dizziness that began this AM. Patient noting a sensation of spinning when opening her eyes this AM with associated imbalance- using SPC today. Dizziness occurs in  episodes lasting minutes. Denies N/T, weakness, dysphagia, dysarthria, hearing loss or otalgia. Oculomotor exam revealed dizziness with smooth pursuits, undershooting with saccades to the R, L convergence insufficiency, positive L HIT, dizziness with VOR, and corrective saccades with VOR cancellation. Patient also with positive L DH and possible positive L roll test and R DH as nystagmus was difficult to discern. Patient tolerated L Epley with no dizziness upon sitting. Patient reported feeling slightly better at end of session and with more confidence with ambulation. Patient's dizziness likely a combination of L posterior canal BPPV and L hypofunction, which is consistent with her previous episodes of vertigo. Plan to treat for dizziness as needed while patient continues to be symptomatic.    Comorbidities Recurrent falls - 2 in past 6 months (past 5 weeks), 6 major falls in past 5-6 yrs (2 concussions, finger fracture); costochondritis, cellulitis of R breast, fibromyalgia, chronic LBP since ~age 18, DDD, sciatica, osteoporosis, degenerative scoliosis, peripheral neuropathy, vertigo, R knee pain, CAD, COPD, HTN, h/o lumpectomy for R breast cancer 2013, ORIF R ankle s/p fall 2006    Examination-Activity Limitations Bathing;Bend;Carry;Dressing;Lift;Locomotion Level;Stand    Examination-Participation Restrictions Church;Community Activity;Interpersonal Relationship;Laundry;Meal Prep;Shop;Yard Work    Rehab Potential Good    PT Frequency 2x / week    PT Duration 6 weeks    PT Treatment/Interventions ADLs/Self Care Home Management;Cryotherapy;Electrical Stimulation;Iontophoresis '4mg'$ /ml Dexamethasone;Moist Heat;Ultrasound;DME Instruction;Gait training;Stair training;Functional mobility training;Therapeutic activities;Therapeutic exercise;Balance training;Neuromuscular re-education;Patient/family education;Manual techniques;Passive range of motion;Dry needling;Taping;Vasopneumatic  Device;Vestibular;Visual/perceptual remediation/compensation;Spinal Manipulations    PT Next Visit Plan assess response to DN; lumbopelvic strengthening and stabilization - HEP review/update PRN; manual therapy & modalities for pain including ionto patch and/or taping as benefit noted for B hip/greater trochanter pain; balance training    PT Home Exercise Plan 6/24 - supported squat/eccentric stand>sit with yellow TB hip ABD isometric, seated yellow TB clam & marching; 7/23 - piriformis, ITB, hip flexor & HS stretches; 7/28 - yellow TBhooklying clm shell    Consulted and Agree with Plan of Care Patient           Patient will benefit from skilled therapeutic intervention in order to improve the following deficits and impairments:  Abnormal gait, Decreased activity tolerance, Decreased balance, Decreased coordination, Decreased endurance, Decreased knowledge of precautions, Decreased mobility, Decreased safety awareness, Decreased strength, Difficulty walking, Dizziness, Increased fascial restricitons, Increased muscle spasms, Impaired perceived functional ability, Impaired flexibility, Improper body mechanics, Postural dysfunction, Pain  Visit Diagnosis: Repeated falls  Unsteadiness on feet  Muscle weakness (generalized)  Other abnormalities of gait and mobility  Dizziness and giddiness  Chronic bilateral low back pain without sciatica  Problem List Patient Active Problem List   Diagnosis Date Noted  . OSA and COPD overlap syndrome ([place]) 06/10/2020  . Serotonin withdrawal syndrome without complication 97/01/6377  . Recurrent falls 05/02/2020  . Statin intolerance 02/29/2020  . RLS (restless legs syndrome) 11/09/2019  . Kidney stone 02/26/2019  . Recurrent sinusitis 02/25/2019  . Hx of colonic polyp 02/25/2019  . DDD (degenerative disc disease), cervical 09/17/2018  . Degenerative scoliosis 04/22/2018  . Primary insomnia 06/25/2017  . Chronic interstitial cystitis 02/01/2017   . Cough 01/09/2017  . Right arm pain 07/20/2016  . Deviated septum 05/12/2016  . Nasal turbinate hypertrophy 05/12/2016  . Dysphagia   . Allergic rhinitis 01/25/2016  . Other and combined forms of senile cataract 07/15/2015  . Dyspnea   . Left hip pain 04/30/2015  . Sciatica 04/30/2015  . Knee pain, right 04/30/2015  . Bilateral carpal tunnel syndrome 03/04/2015  . Hyperlipidemia 03/01/2015  . Pulmonary nodules 02/12/2015  . External hemorrhoids 02/05/2015  . Chronic night sweats 01/08/2015  . Atypical chest pain 01/01/2015  . Renal insufficiency 07/16/2014  . Memory loss 05/22/2014  . Peripheral neuropathy 05/22/2014  . Abdominal pain 10/26/2013  . Headache 10/13/2013  . Arthralgia 08/18/2013  . ANA positive 07/29/2013  . Depression 02/05/2013  . Family history of malignant hyperthermia 02/05/2013  . Malignant neoplasm of lower-outer quadrant of right breast of female, estrogen receptor positive ([place]) 01/03/2013  . Splenic lesion 09/02/2012  . Asthma, allergic 08/05/2012  . GERD (gastroesophageal reflux disease) 06/03/2012  . Edema 04/08/2012  . Stricture and stenosis of esophagus 10/19/2011  . Functional constipation 07/21/2011  . Nonspecific (abnormal) findings on radiological and other examination of biliary tract 07/21/2011  . Osteoporosis 04/17/2011  . Leg pain 02/24/2011  . FIBROCYSTIC BREAST DISEASE, HX OF 10/18/2010  . Essential hypertension 08/25/2010  . CAD in native artery 08/25/2010  . TOBACCO ABUSE, HX OF 08/25/2010  . GENERALIZED ANXIETY DISORDER 08/03/2010  . Fibromyalgia 01/11/2010     Janene Harvey, PT, DPT 08/03/20 2:41 PM   [facility] [address]  [place], [place], [address] Phone: [phone]   Fax:  [phone]  Name: [name] MRN: [id number] Date of Birth: [birth year]

## 2020-08-04 ENCOUNTER — Telehealth: Payer: Self-pay | Admitting: Internal Medicine

## 2020-08-04 DIAGNOSIS — R197 Diarrhea, unspecified: Secondary | ICD-10-CM

## 2020-08-04 DIAGNOSIS — R1032 Left lower quadrant pain: Secondary | ICD-10-CM

## 2020-08-04 NOTE — Telephone Encounter (Signed)
Spoke with the patient. Since the last visit she continue with on and off diarrhea (watery, with clumps of brown stools, no blood). Continue with LLQ abdominal pain, mild. Continue with low-grade fevers. She is able to drink plenty of fluids Plan: CT abdomen with and without, rule out diverticulitis, colitis and others.

## 2020-08-06 ENCOUNTER — Other Ambulatory Visit: Payer: Self-pay

## 2020-08-06 ENCOUNTER — Ambulatory Visit (HOSPITAL_BASED_OUTPATIENT_CLINIC_OR_DEPARTMENT_OTHER)
Admission: RE | Admit: 2020-08-06 | Discharge: 2020-08-06 | Disposition: A | Discharge status: Home / Self Care | Payer: Medicare Other | Source: Ambulatory Visit | Attending: Internal Medicine | Admitting: Internal Medicine

## 2020-08-06 ENCOUNTER — Ambulatory Visit: Payer: Medicare Other | Admitting: Physical Therapy

## 2020-08-06 DIAGNOSIS — G8929 Other chronic pain: Secondary | ICD-10-CM

## 2020-08-06 DIAGNOSIS — M6281 Muscle weakness (generalized): Secondary | ICD-10-CM | POA: Diagnosis not present

## 2020-08-06 DIAGNOSIS — R296 Repeated falls: Secondary | ICD-10-CM

## 2020-08-06 DIAGNOSIS — M545 Low back pain, unspecified: Secondary | ICD-10-CM

## 2020-08-06 DIAGNOSIS — I739 Peripheral vascular disease, unspecified: Secondary | ICD-10-CM | POA: Diagnosis not present

## 2020-08-06 DIAGNOSIS — R2681 Unsteadiness on feet: Secondary | ICD-10-CM

## 2020-08-06 DIAGNOSIS — R2689 Other abnormalities of gait and mobility: Secondary | ICD-10-CM

## 2020-08-06 DIAGNOSIS — K7689 Other specified diseases of liver: Secondary | ICD-10-CM | POA: Diagnosis not present

## 2020-08-06 DIAGNOSIS — R42 Dizziness and giddiness: Secondary | ICD-10-CM | POA: Diagnosis not present

## 2020-08-06 DIAGNOSIS — K449 Diaphragmatic hernia without obstruction or gangrene: Secondary | ICD-10-CM | POA: Diagnosis not present

## 2020-08-06 DIAGNOSIS — M47814 Spondylosis without myelopathy or radiculopathy, thoracic region: Secondary | ICD-10-CM | POA: Diagnosis not present

## 2020-08-06 DIAGNOSIS — R1032 Left lower quadrant pain: Secondary | ICD-10-CM | POA: Diagnosis not present

## 2020-08-06 DIAGNOSIS — R197 Diarrhea, unspecified: Secondary | ICD-10-CM | POA: Diagnosis not present

## 2020-08-06 MED ORDER — IOHEXOL 300 MG/ML  SOLN
100.0000 mL | Freq: Once | INTRAMUSCULAR | Status: AC | PRN
  Administered 2020-08-06: 100 mL via INTRAVENOUS

## 2020-08-06 NOTE — Therapy (Signed)
[facility] [place] [address]  [place], [place], [address] Phone: [phone]   Fax:  [phone]  Physical Therapy Treatment / Recert  Patient Details  Name: [name] MRN: [id number] Date of Birth: 26-Apr-1946 Referring Provider (PT): [name], MD  Progress Note  Reporting Period 07/23/2020 to 08/06/2020  See note below for Objective Data and Assessment of Progress/Goals.      Encounter Date: 08/06/2020   PT End of Session - 08/06/20 1017    Visit Number 13    Number of Visits 25    Date for PT Re-Evaluation 09/17/20    Authorization Type [employer]    Progress Note Due on Visit 23   Recert on visit #70 - 1/77/93   PT Start Time 1017    PT Stop Time 1113    PT Time Calculation (min) 56 min    Activity Tolerance Patient tolerated treatment well;Other (comment)   dizziness   Behavior During Therapy WFL for tasks assessed/performed           Past Medical History:  Diagnosis Date  . Allergy   . Anemia   . Anxiety    past hx   . Arthritis   . Asthma   . Atrial flutter ([place])    past history- not current  . CAD (coronary artery disease) CARDIOLOGIST--  DR Angelena Form   mild non-obstructive cad  . Cancer ([place])    right  . Cataract    bilaterally removed   . Chronic constipation   . Chronic kidney disease    interstitial cystitis  . COPD (chronic obstructive pulmonary disease) ([place])   . Depression    past hx   . Family history of malignant hyperthermia    father had this  . Fibromyalgia   . Frequency of urination   . GERD (gastroesophageal reflux disease)   . H/O hiatal hernia   . History of basal cell carcinoma excision    X2  . History of breast cancer ONCOLOGIST-- DR Jana Hakim---  NO RECURRANCE   DX 07/2012;  LOW GRADE DCIS  ER+PR+  ----  S/P RIGHT LUMPECTOMY WITH NEGATIVE MARGINS/   RADIATION ENDED 11/2012  . History of chronic bronchitis   . History of colonic polyps   . History of  tachycardia    CONTROLLED  WITH ATENOLOL  . Hyperlipidemia   . Hypertension   . Neuromuscular disorder (HCC)    fibromyalgia  . Osteoporosis 01/2019   T score -2.2 stable/improved from prior study  . Pelvic pain   . Personal history of radiation therapy   . S/P radiation therapy 11/12/12 - 12/05/12   right Breast  . Sepsis ([place]) 2014   from UTI   . Sinus headache   . Urgency of urination     Past Surgical History:  Procedure Laterality Date  . Center STUDY N/A 04/03/2016   Procedure: [place] STUDY;  Surgeon: Manus Gunning, MD;  Location: [facility];  Service: Gastroenterology;  Laterality: N/A;  . BREAST LUMPECTOMY Right 10-11-2012   W/ SLN BX  . CARDIAC CATHETERIZATION  09-13-2007  DR Lia Foyer   WELL-PRESERVED LVF/  DIFFUSE SCATTERED CORONARY CALCIFACATION AND ATHEROSCLEROSIS WITHOUT OBSTRUCTION  . CARDIAC CATHETERIZATION  08-04-2010  DR Angelena Form   NON-OBSTRUCTIVE CAD/  pLAD 40%/  oLAD 30%/  mLAD 30%/  pRCA 30%/  EF 60%  . CARDIOVASCULAR STRESS TEST  06-18-2012  DR Angelena Form   LOW RISK NUCLEAR STUDY/  SMALL FIXED AREA OF MODERATELY DECREASED UPTAKE IN ANTEROSEPTAL WALL WHICH MAY BE ARTIFACTUAL/  NO ISCHEMIA/  EF 68%  . COLONOSCOPY  09-29-2010  . CYSTOSCOPY    . CYSTOSCOPY WITH HYDRODISTENSION AND BIOPSY N/A 03/06/2014   Procedure: CYSTOSCOPY/HYDRODISTENSION/ INSTILATION OF MARCAINE AND PYRIDIUM;  Surgeon: Ailene Rud, MD;  Location[facility];  Service: Urology;  Laterality: N/A;  . ESOPHAGEAL MANOMETRY N/A 04/03/2016   Procedure: ESOPHAGEAL MANOMETRY (EM);  Surgeon: Manus Gunning, MD;  Location: [facility];  Service: Gastroenterology;  Laterality: N/A;  . EXTRACORPOREAL SHOCK WAVE LITHOTRIPSY Left 02/06/2019   Procedure: EXTRACORPOREAL SHOCK WAVE LITHOTRIPSY (ESWL);  Surgeon: Kathie Rhodes, MD;  Location: [facility];  Service: Urology;  Laterality: Left;  . NASAL SINUS SURGERY  1985  . ORIF RIGHT ANKLE  FX  2006  . POLYPECTOMY      . REMOVAL VOCAL CORD CYST  FEB 2014  . RIGHT BREAST BX  08-23-2012  . RIGHT HAND SURGERY  X3  LAST ONE 2009   INCLUDES  ORIF RIGHT 5TH FINGER AND REVISION TWICE  . SKIN CANCER EXCISION    . TONSILLECTOMY AND ADENOIDECTOMY  AGE 63  . TOTAL ABDOMINAL HYSTERECTOMY W/ BILATERAL SALPINGOOPHORECTOMY  1982   W/  APPENDECTOMY  . TRANSTHORACIC ECHOCARDIOGRAM  06-24-2012   GRADE I DIASTOLIC DYSFUNCTION/  EF 55-60%/  MILD MR  . UPPER GASTROINTESTINAL ENDOSCOPY      There were no vitals filed for this visit.   Subjective Assessment - 08/06/20 1021    Subjective Pt reports dizziness has been better since last visit but she continues to be careful with head motions. Did note increased sensitivity to elevator ride up to PT. Pt also reporting she had her dates confused for her vacation and will be leaving 1 week later than originally thought.    Pertinent History Recurrent falls - 2 in past 6 months (past 5 weeks), 6 major falls in past 5-6 yrs (2 concussions, finger fracture); costochondritis, cellulitis of R breast, fibromyalgia, chronic LBP since ~age 52, DDD, sciatica, osteoporosis, degenerative scoliosis, peripheral neuropathy, vertigo, R knee pain, CAD, COPD, HTN, h/o lumpectomy for R breast cancer 2013, ORIF R ankle s/p fall 2006    Limitations Lifting;Standing;Walking;House hold activities    Patient Stated Goals "Be able to gain some strength in my legs to help with my back. To be able stand w/o feeling like I'm swaying and walk w/o tripping."    Currently in Pain? Yes    Pain Score 4     Pain Location Back    Pain Orientation Lower;Left;Right   L>R   Pain Descriptors / Indicators Constant   "gnawing"   Pain Type Chronic pain    Pain Radiating Towards none today    Pain Frequency Constant    Aggravating Factors  walking up stairs              [facility] PT Assessment - 08/06/20 1017      Assessment   Medical Diagnosis Multiple falls    Referring Provider (PT) [name], MD    Next MD  Visit 09/07/20      Strength   Right Hip Flexion 4-/5    Right Hip Extension 4-/5    Right Hip External Rotation  4-/5   pain in buttock & low back   Right Hip Internal Rotation 4+/5    Right Hip ABduction 4-/5    Right Hip ADduction 4-/5    Left Hip Flexion 4-/5  Left Hip Extension 4-/5   pain in buttock & low back   Left Hip External Rotation 4-/5    Left Hip Internal Rotation 4/5    Left Hip ABduction 4/5    Left Hip ADduction 4-/5   pain over greater trochanter   Right Knee Flexion 4/5    Right Knee Extension 4/5    Left Knee Flexion 4/5    Left Knee Extension 4/5    Right Ankle Dorsiflexion 4/5    Right Ankle Plantar Flexion 4-/5    Left Ankle Dorsiflexion 4/5    Left Ankle Plantar Flexion 4-/5      Standardized Balance Assessment   10 Meter Walk 8.48 sec      Berg Balance Test   Sit to Stand Able to stand without using hands and stabilize independently    Standing Unsupported Able to stand safely 2 minutes    Sitting with Back Unsupported but Feet Supported on Floor or Stool Able to sit safely and securely 2 minutes    Stand to Sit Sits safely with minimal use of hands    Transfers Able to transfer safely, minor use of hands    Standing Unsupported with Eyes Closed Able to stand 10 seconds with supervision    Standing Unsupported with Feet Together Able to place feet together independently and stand 1 minute safely    From Standing, Reach Forward with Outstretched Arm Can reach forward >12 cm safely (5")    From Standing Position, Pick up Object from Floor Able to pick up shoe safely and easily    From Standing Position, Turn to Look Behind Over each Shoulder Looks behind from both sides and weight shifts well    Turn 360 Degrees Able to turn 360 degrees safely one side only in 4 seconds or less    Standing Unsupported, Alternately Place Feet on Step/Stool Able to stand independently and safely and complete 8 steps in 20 seconds    Standing Unsupported, One Foot in  Front Able to plae foot ahead of the other independently and hold 30 seconds    Standing on One Leg Tries to lift leg/unable to hold 3 seconds but remains standing independently    Total Score 49    Berg comment: 46-51 = moderate fall risk (>50%)       Functional Gait  Assessment   Gait Level Surface Walks 20 ft in less than 5.5 sec, no assistive devices, good speed, no evidence for imbalance, normal gait pattern, deviates no more than 6 in outside of the 12 in walkway width.    Change in Gait Speed Able to smoothly change walking speed without loss of balance or gait deviation. Deviate no more than 6 in outside of the 12 in walkway width.    Gait with Horizontal Head Turns Performs head turns with moderate changes in gait velocity, slows down, deviates 10-15 in outside 12 in walkway width but recovers, can continue to walk.    Gait with Vertical Head Turns Performs task with slight change in gait velocity (eg, minor disruption to smooth gait path), deviates 6 - 10 in outside 12 in walkway width or uses assistive device    Gait and Pivot Turn Pivot turns safely within 3 sec and stops quickly with no loss of balance.    Step Over Obstacle Is able to step over one shoe box (4.5 in total height) without changing gait speed. No evidence of imbalance.    Gait with Narrow Base of Support  Ambulates less than 4 steps heel to toe or cannot perform without assistance.    Gait with Eyes Closed Walks 20 ft, slow speed, abnormal gait pattern, evidence for imbalance, deviates 10-15 in outside 12 in walkway width. Requires more than 9 sec to ambulate 20 ft.    Ambulating Backwards Walks 20 ft, uses assistive device, slower speed, mild gait deviations, deviates 6-10 in outside 12 in walkway width.    Steps Alternating feet, must use rail.    Total Score 19    FGA comment: 19-24 = medium risk fall                         [facility] Adult PT Treatment/Exercise - 08/06/20 1017      Knee/Hip Exercises:  Aerobic   Recumbent Bike L2 x 6 min                    PT Short Term Goals - 06/22/20 1015      PT SHORT TERM GOAL #1   Title Patient will be independent with initial HEP    Status Achieved   06/22/20     PT SHORT TERM GOAL #2   Title Complete FGA assessment    Status Achieved   06/22/20            PT Long Term Goals - 08/06/20 1034      PT LONG TERM GOAL #1   Title Patient will be independent with ongoing/advanced HEP    Status Partially Met    Target Date 09/17/20      PT LONG TERM GOAL #2   Title Patient will demonstrate improved B LE strength to >/= 4/5 to 4+/5 for improved stability and ease of mobility    Status Partially Met    Target Date 09/17/20      PT LONG TERM GOAL #3   Title Patient will improve Berg score to >/= 52/56 to improve safety stability with ADLs in standing and reduce risk for falls    Status On-going   08/06/20 Merrilee Jansky = 49/56   Target Date 09/17/20      PT LONG TERM GOAL #4   Title Patient will report no further instances of falls    Status Partially Met   08/06/20 - No falls since start of PT, but continues to experience R ankle instability and vestibular issues which increase her risk for falls   Target Date 09/17/20      PT LONG TERM GOAL #5   Title Patient will improve FGA score to >/= 20/30 to improve gait stability and reduce risk for falls    Status On-going   08/06/20 - FGA = 19/30   Target Date 09/17/20      PT LONG TERM GOAL #6   Title Patient to report no dizziness remaining with bed mobility.    Time 1    Period Weeks    Status On-going    Target Date 08/10/20                 Plan - 08/06/20 1113    Clinical Impression Statement Vaughan Basta notes significant improvement in acute dizziness following vestibular assessment and intervention on last PT visit but does not feel that she is fully back to normal - pt encouraged to attempt her VOR exercises as tolerated and will f/u next visit for further vestibular  reassessment. Recent flare-up of back pain also seems to be improving today with  pt noting back pain less intense and no hip or LE pain today. Strength has fluctuated since most recent assessment due to decreased activity from increased pain and recurrence of her vestibular issues with MMT today demonstrating mild decline in global LE strength. Balance testing also revealing some fluctuation in Berg items with net score unchanged but continued improvement noted from 17/30 (13/30 on eval) to 19/30 on FGA. Current balance deficits related to ongoing vestibular issues, LE weakness and instability related to weakness and pain. Giving ongoing pain, balance, vestibular and weakness deficits, with current LTGs only partially met or ongoing, will recommend recert for continued PT 2x/wk for up to 6 weeks (pt will miss 1 week due to traveling on vacation).    Personal Factors and Comorbidities Comorbidity 3+;Age;Fitness;Past/Current Experience    Comorbidities Recurrent falls - 2 in past 6 months, 6 major falls in past 5-6 yrs (2 concussions, finger fracture); costochondritis, cellulitis of R breast, fibromyalgia, chronic LBP since ~age 50, DDD, sciatica, osteoporosis, degenerative scoliosis, peripheral neuropathy, vertigo, R knee pain, CAD, COPD, HTN, h/o lumpectomy for R breast cancer 2013, ORIF R ankle s/p fall 2006    Examination-Activity Limitations Bathing;Bend;Carry;Dressing;Lift;Locomotion Level;Stand    Examination-Participation Restrictions Church;Community Activity;Interpersonal Relationship;Laundry;Meal Prep;Shop;Yard Work    Rehab Potential Good    PT Frequency 2x / week    PT Duration 6 weeks    PT Treatment/Interventions ADLs/Self Care Home Management;Cryotherapy;Electrical Stimulation;Iontophoresis 44m/ml Dexamethasone;Moist Heat;Ultrasound;DME Instruction;Gait training;Stair training;Functional mobility training;Therapeutic activities;Therapeutic exercise;Balance training;Neuromuscular  re-education;Patient/family education;Manual techniques;Passive range of motion;Dry needling;Taping;Vasopneumatic Device;Vestibular;Visual/perceptual remediation/compensation;Spinal Manipulations    PT Next Visit Plan vestibular reassessment & treatment as indicated; lumbopelvic strengthening and stabilization - HEP review/update PRN; manual therapy & modalities for pain including ionto patch and/or taping as benefit noted for B hip/greater trochanter pain; balance training    PT Home Exercise Plan 6/24 - supported squat/eccentric stand>sit with yellow TB hip ABD isometric, seated yellow TB clam & marching; 7/23 - piriformis, ITB, hip flexor & HS stretches; 7/28 - yellow TB hook lying clam shell    Consulted and Agree with Plan of Care Patient           Patient will benefit from skilled therapeutic intervention in order to improve the following deficits and impairments:  Abnormal gait, Decreased activity tolerance, Decreased balance, Decreased coordination, Decreased endurance, Decreased knowledge of precautions, Decreased mobility, Decreased safety awareness, Decreased strength, Difficulty walking, Dizziness, Increased fascial restricitons, Increased muscle spasms, Impaired perceived functional ability, Impaired flexibility, Improper body mechanics, Postural dysfunction, Pain  Visit Diagnosis: Repeated falls  Unsteadiness on feet  Muscle weakness (generalized)  Other abnormalities of gait and mobility  Dizziness and giddiness  Chronic bilateral low back pain without sciatica     Problem List Patient Active Problem List   Diagnosis Date Noted  . OSA and COPD overlap syndrome ([place] 06/10/2020  . Serotonin withdrawal syndrome without complication 027/02/5008 . Recurrent falls 05/02/2020  . Statin intolerance 02/29/2020  . RLS (restless legs syndrome) 11/09/2019  . Kidney stone 02/26/2019  . Recurrent sinusitis 02/25/2019  . Hx of colonic polyp 02/25/2019  . DDD (degenerative disc  disease), cervical 09/17/2018  . Degenerative scoliosis 04/22/2018  . Primary insomnia 06/25/2017  . Chronic interstitial cystitis 02/01/2017  . Cough 01/09/2017  . Right arm pain 07/20/2016  . Deviated septum 05/12/2016  . Nasal turbinate hypertrophy 05/12/2016  . Dysphagia   . Allergic rhinitis 01/25/2016  . Other and combined forms of senile cataract 07/15/2015  . Dyspnea   . Left hip  pain 04/30/2015  . Sciatica 04/30/2015  . Knee pain, right 04/30/2015  . Bilateral carpal tunnel syndrome 03/04/2015  . Hyperlipidemia 03/01/2015  . Pulmonary nodules 02/12/2015  . External hemorrhoids 02/05/2015  . Chronic night sweats 01/08/2015  . Atypical chest pain 01/01/2015  . Renal insufficiency 07/16/2014  . Memory loss 05/22/2014  . Peripheral neuropathy 05/22/2014  . Abdominal pain 10/26/2013  . Headache 10/13/2013  . Arthralgia 08/18/2013  . ANA positive 07/29/2013  . Depression 02/05/2013  . Family history of malignant hyperthermia 02/05/2013  . Malignant neoplasm of lower-outer quadrant of right breast of female, estrogen receptor positive ([place]) 01/03/2013  . Splenic lesion 09/02/2012  . Asthma, allergic 08/05/2012  . GERD (gastroesophageal reflux disease) 06/03/2012  . Edema 04/08/2012  . Stricture and stenosis of esophagus 10/19/2011  . Functional constipation 07/21/2011  . Nonspecific (abnormal) findings on radiological and other examination of biliary tract 07/21/2011  . Osteoporosis 04/17/2011  . Leg pain 02/24/2011  . FIBROCYSTIC BREAST DISEASE, HX OF 10/18/2010  . Essential hypertension 08/25/2010  . CAD in native artery 08/25/2010  . TOBACCO ABUSE, HX OF 08/25/2010  . GENERALIZED ANXIETY DISORDER 08/03/2010  . Fibromyalgia 01/11/2010    Percival Spanish, PT, MPT 08/06/2020, 12:08 PM  [facility] [address]  [place], [place], [address] Phone: [phone]   Fax:  [phone]  Name: EDRA RICCARDI MRN: [id number] Date of Birth: 1946-03-07

## 2020-08-09 ENCOUNTER — Other Ambulatory Visit: Payer: Self-pay

## 2020-08-09 ENCOUNTER — Encounter: Payer: Self-pay | Admitting: Physical Therapy

## 2020-08-09 ENCOUNTER — Telehealth: Payer: Self-pay | Admitting: Gastroenterology

## 2020-08-09 DIAGNOSIS — R109 Unspecified abdominal pain: Secondary | ICD-10-CM

## 2020-08-09 DIAGNOSIS — R197 Diarrhea, unspecified: Secondary | ICD-10-CM

## 2020-08-09 MED ORDER — DICYCLOMINE HCL 10 MG PO CAPS: 10.0000 mg | ORAL_CAPSULE | Freq: Three times a day (TID) | ORAL | 1 refills | Status: DC

## 2020-08-09 NOTE — Telephone Encounter (Signed)
Spoke with patient, she reports LLQ pain for the last 6 months "almost in the pelvic area" will go across her stomach, pt states that she has been having diarrhea that has "chocolate covered peas" sometimes watery diarrhea. Pt states that the last 6 weeks it has been more consistent. No n/v, pt states that she will take Imodium once and then wont have a BM for a couple of days, pt denies any fever or use of antibiotics. Pt is scheduled to see Carl Best, NP on 09/15/20 at 10 am, any recommendations until appt?

## 2020-08-09 NOTE — Telephone Encounter (Signed)
Patient states she is having a funky kind of diarrhea along with abdominal pain please advise

## 2020-08-09 NOTE — Telephone Encounter (Signed)
Spoke with patient she is aware that she should try a daily fiber supplement like Citrucel, pt aware that we have sent in Bentyl to her pharmacy for abdominal pain, pt advised to keep appt on 09/15/20 to further discuss.  RX sent to pharmacy.

## 2020-08-09 NOTE — Telephone Encounter (Signed)
She has had a relatively recent colonoscopy without any significant pathology. She may want to try a daily fiber supplement such as Citrucel and we can give her bentyl 10mg  to use every 8 hours to see if that will help. Keep appointment on 9/22 otherwise

## 2020-08-11 ENCOUNTER — Other Ambulatory Visit: Payer: Self-pay

## 2020-08-11 ENCOUNTER — Encounter: Payer: Self-pay | Admitting: Physical Therapy

## 2020-08-11 ENCOUNTER — Telehealth: Payer: Self-pay | Admitting: Gastroenterology

## 2020-08-11 ENCOUNTER — Ambulatory Visit: Payer: Medicare Other | Admitting: Physical Therapy

## 2020-08-11 DIAGNOSIS — R42 Dizziness and giddiness: Secondary | ICD-10-CM

## 2020-08-11 DIAGNOSIS — M6281 Muscle weakness (generalized): Secondary | ICD-10-CM | POA: Diagnosis not present

## 2020-08-11 DIAGNOSIS — R296 Repeated falls: Secondary | ICD-10-CM | POA: Diagnosis not present

## 2020-08-11 DIAGNOSIS — R2681 Unsteadiness on feet: Secondary | ICD-10-CM

## 2020-08-11 DIAGNOSIS — R2689 Other abnormalities of gait and mobility: Secondary | ICD-10-CM

## 2020-08-11 DIAGNOSIS — M545 Low back pain: Secondary | ICD-10-CM | POA: Diagnosis not present

## 2020-08-11 DIAGNOSIS — G8929 Other chronic pain: Secondary | ICD-10-CM | POA: Diagnosis not present

## 2020-08-11 NOTE — Telephone Encounter (Signed)
Daryl with [employer] calling in reference to prescription for Dycliclomine, he needs to know if pt has abd pain and IBS. Pls call him [phone] option 5.

## 2020-08-11 NOTE — Patient Instructions (Signed)
    Home exercise program created by Sriansh Farra, PT.  For questions, please contact Michalle Rademaker via phone at [phone] or email at [email or web address]  [facility] [place] [address]  [address] [place], [place], [address] Phone: [phone]   Fax:  [phone]    

## 2020-08-11 NOTE — Telephone Encounter (Signed)
Called and spoke to [place].  All questions answered. PA approved for Bentyl.

## 2020-08-11 NOTE — Therapy (Signed)
[facility] [address]  [place], [place], [address] Phone: [phone]   Fax:  [phone]  Physical Therapy Treatment  Patient Details  Name: [name] MRN: [id number] Date of Birth: 11/28/1946 Referring Provider (PT): [name], MD   Encounter Date: 08/11/2020   PT End of Session - 08/11/20 0848    Visit Number 14    Number of Visits 25    Date for PT Re-Evaluation 09/17/20    Authorization Type [employer]    Progress Note Due on Visit 23   Recert on visit #69 - [birth year]   PT Start Time 0848    PT Stop Time 0932    PT Time Calculation (min) 44 min    Activity Tolerance Patient tolerated treatment well;Other (comment)   dizziness   Behavior During Therapy WFL for tasks assessed/performed           Past Medical History:  Diagnosis Date  . Allergy   . Anemia   . Anxiety    past hx   . Arthritis   . Asthma   . Atrial flutter ([place])    past history- not current  . CAD (coronary artery disease) CARDIOLOGIST--  DR Angelena Form   mild non-obstructive cad  . Cancer ([place])    right  . Cataract    bilaterally removed   . Chronic constipation   . Chronic kidney disease    interstitial cystitis  . COPD (chronic obstructive pulmonary disease) ([place])   . Depression    past hx   . Family history of malignant hyperthermia    father had this  . Fibromyalgia   . Frequency of urination   . GERD (gastroesophageal reflux disease)   . H/O hiatal hernia   . History of basal cell carcinoma excision    X2  . History of breast cancer ONCOLOGIST-- DR Jana Hakim---  NO RECURRANCE   DX 07/2012;  LOW GRADE DCIS  ER+PR+  ----  S/P RIGHT LUMPECTOMY WITH NEGATIVE MARGINS/   RADIATION ENDED 11/2012  . History of chronic bronchitis   . History of colonic polyps   . History of tachycardia    CONTROLLED  WITH ATENOLOL  . Hyperlipidemia   . Hypertension   . Neuromuscular disorder (H[facility])    fibromyalgia  . Osteoporosis  01/2019   T score -2.2 stable/improved from prior study  . Pelvic pain   . Personal history of radiation therapy   . S/P radiation therapy 11/12/12 - 12/05/12   right Breast  . Sepsis ([place]) 2014   from UTI   . Sinus headache   . Urgency of urination     Past Surgical History:  Procedure Laterality Date  . [place] STUDY N/A 04/03/2016   Procedure: [place] STUDY;  Surgeon: Manus Gunning, MD;  Location: [facility];  Service: Gastroenterology;  Laterality: N/A;  . BREAST LUMPECTOMY Right 10-11-2012   W/ SLN BX  . CARDIAC CATHETERIZATION  09-13-2007  DR Lia Foyer   WELL-PRESERVED LVF/  DIFFUSE SCATTERED CORONARY CALCIFACATION AND ATHEROSCLEROSIS WITHOUT OBSTRUCTION  . CARDIAC CATHETERIZATION  08-04-2010  DR Angelena Form   NON-OBSTRUCTIVE CAD/  pLAD 40%/  oLAD 30%/  mLAD 30%/  pRCA 30%/  EF 60%  . CARDIOVASCULAR STRESS TEST  06-18-2012  DR McALHANY   LOW RISK NUCLEAR STUDY/  SMALL FIXED AREA OF MODERATELY DECREASED UPTAKE IN ANTEROSEPTAL WALL WHICH MAY BE ARTIFACTUAL/  NO ISCHEMIA/  EF 68%  . COLONOSCOPY  09-29-2010  . CYSTOSCOPY    . CYSTOSCOPY WITH HYDRODISTENSION AND BIOPSY N/A 03/06/2014   Procedure: CYSTOSCOPY/HYDRODISTENSION/ INSTILATION OF MARCAINE AND PYRIDIUM;  Surgeon: Ailene Rud, MD;  Location: [name];  Service: Urology;  Laterality: N/A;  . ESOPHAGEAL MANOMETRY N/A 04/03/2016   Procedure: ESOPHAGEAL MANOMETRY (EM);  Surgeon: Manus Gunning, MD;  Location: [facility];  Service: Gastroenterology;  Laterality: N/A;  . EXTRACORPOREAL SHOCK WAVE LITHOTRIPSY Left 02/06/2019   Procedure: EXTRACORPOREAL SHOCK WAVE LITHOTRIPSY (ESWL);  Surgeon: Kathie Rhodes, MD;  Location: [facility];  Service: Urology;  Laterality: Left;  . NASAL SINUS SURGERY  1985  . ORIF RIGHT ANKLE  FX  2006  . POLYPECTOMY    . REMOVAL VOCAL CORD CYST  FEB 2014  . RIGHT BREAST BX  08-23-2012  . RIGHT HAND SURGERY  X3  LAST ONE 2009   INCLUDES  ORIF RIGHT 5TH FINGER  AND REVISION TWICE  . SKIN CANCER EXCISION    . TONSILLECTOMY AND ADENOIDECTOMY  AGE 79  . TOTAL ABDOMINAL HYSTERECTOMY W/ BILATERAL SALPINGOOPHORECTOMY  1982   W/  APPENDECTOMY  . TRANSTHORACIC ECHOCARDIOGRAM  06-24-2012   GRADE I DIASTOLIC DYSFUNCTION/  EF 55-60%/  MILD MR  . UPPER GASTROINTESTINAL ENDOSCOPY      There were no vitals filed for this visit.   Subjective Assessment - 08/11/20 0852    Subjective Pt reportis she is still experiencing some of the spinning feeling at times and noted difficulty focusing while shopping at [facility] yesterday. She denies back pain today but notes a headache which she thinks is related to the vertigo.    Pertinent History Recurrent falls - 2 in past 6 months (past 5 weeks), 6 major falls in past 5-6 yrs (2 concussions, finger fracture); costochondritis, cellulitis of R breast, fibromyalgia, chronic LBP since ~age 45, DDD, sciatica, osteoporosis, degenerative scoliosis, peripheral neuropathy, vertigo, R knee pain, CAD, COPD, HTN, h/o lumpectomy for R breast cancer 2013, ORIF R ankle s/p fall 2006    Patient Stated Goals "Be able to gain some strength in my legs to help with my back. To be able stand w/o feeling like I'm swaying and walk w/o tripping."    Currently in Pain? Yes    Pain Score 0-No pain    Pain Location Back    Pain Score 5    Pain Location Head    Pain Orientation Posterior;Lower    Pain Descriptors / Indicators Aching                             [facility] Adult PT Treatment/Exercise - 08/11/20 0848      Neck Exercises: Seated   Neck Retraction 10 reps;5 secs    Other Seated Exercise Scap retraction + depression 10 x 5"      Knee/Hip Exercises: Aerobic   Recumbent Bike L2 x 6 min      Manual Therapy   Manual Therapy Soft tissue mobilization;Myofascial release;Manual Traction    Manual therapy comments supine    Soft tissue mobilization STM/DTM/XFM to B suboccipitals, cerivcal paraspinals, SCM, scalenes and UT     Myofascial Release B suboccipital release, manual TPR to R UT and SCM, pin & stretch to R SCM and UT    Manual Traction gentle cervical distraction 5 x 30 sec      Neck Exercises: Stretches   Upper Trapezius Stretch Right;Left;30 seconds;1 rep    Upper Trapezius Stretch Limitations cues for  chin tuck prior to lateral flexion    Neck Stretch 30 seconds;2 reps    Neck Stretch Limitations SCM stretch           Vestibular Treatment/Exercise - 08/11/20 0848      X1 Viewing Horizontal   Foot Position seated    Reps 10    Comments dizziness noted at end ROM      X1 Viewing Vertical   Foot Position seated    Reps 10    Comments no dizziness                 PT Education - 08/11/20 0932    Education Details Review of postural awareness + HEP update: postural stretches/strengthening for neck    Person(s) Educated Patient    Methods Explanation;Demonstration;Verbal cues;Tactile cues;Handout    Comprehension Verbalized understanding;Verbal cues required;Tactile cues required;Returned demonstration;Need further instruction            PT Short Term Goals - 06/22/20 1015      PT SHORT TERM GOAL #1   Title Patient will be independent with initial HEP    Status Achieved   06/22/20     PT SHORT TERM GOAL #2   Title Complete FGA assessment    Status Achieved   06/22/20            PT Long Term Goals - 08/06/20 1034      PT LONG TERM GOAL #1   Title Patient will be independent with ongoing/advanced HEP    Status Partially Met    Target Date 09/17/20      PT LONG TERM GOAL #2   Title Patient will demonstrate improved B LE strength to >/= 4/5 to 4+/5 for improved stability and ease of mobility    Status Partially Met    Target Date 09/17/20      PT LONG TERM GOAL #3   Title Patient will improve Berg score to >/= 52/56 to improve safety stability with ADLs in standing and reduce risk for falls    Status On-going   08/06/20 Merrilee Jansky = 49/56   Target Date 09/17/20      PT  LONG TERM GOAL #4   Title Patient will report no further instances of falls    Status Partially Met   08/06/20 - No falls since start of PT, but continues to experience R ankle instability and vestibular issues which increase her risk for falls   Target Date 09/17/20      PT LONG TERM GOAL #5   Title Patient will improve FGA score to >/= 20/30 to improve gait stability and reduce risk for falls    Status On-going   08/06/20 - FGA = 19/30   Target Date 09/17/20      PT LONG TERM GOAL #6   Title Patient to report no dizziness remaining with bed mobility.    Time 1    Period Weeks    Status On-going    Target Date 08/10/20                 Plan - 08/11/20 0932    Clinical Impression Statement Candise reports ongoing issues with vertigo, noting difficulty focusing while trying to shop in [place] yesterday and reporting headache with dizziness today indicating potential cervicogenic component to dizziness in addition to vestibular deficits. Able to provide relief from headache with manual therapy with pt noting less dizziness as a result along with better ability to focus, although still noting mild  dizziness with end range horizontal VOR exercises (no issues with vertical VOR). HEP updated to include stretches and postural correction exercises targeting neck and upper back with good tolerance.    Personal Factors and Comorbidities Comorbidity 3+;Age;Fitness;Past/Current Experience    Comorbidities Recurrent falls - 2 in past 6 months, 6 major falls in past 5-6 yrs (2 concussions, finger fracture); costochondritis, cellulitis of R breast, fibromyalgia, chronic LBP since ~age 12, DDD, sciatica, osteoporosis, degenerative scoliosis, peripheral neuropathy, vertigo, R knee pain, CAD, COPD, HTN, h/o lumpectomy for R breast cancer 2013, ORIF R ankle s/p fall 2006    Examination-Activity Limitations Bathing;Bend;Carry;Dressing;Lift;Locomotion Level;Stand    Examination-Participation Restrictions  Church;Community Activity;Interpersonal Relationship;Laundry;Meal Prep;Shop;Yard Work    Rehab Potential Good    PT Frequency 2x / week    PT Duration 6 weeks    PT Treatment/Interventions ADLs/Self Care Home Management;Cryotherapy;Electrical Stimulation;Iontophoresis 4mg /ml Dexamethasone;Moist Heat;Ultrasound;DME Instruction;Gait training;Stair training;Functional mobility training;Therapeutic activities;Therapeutic exercise;Balance training;Neuromuscular re-education;Patient/family education;Manual techniques;Passive range of motion;Dry needling;Taping;Vasopneumatic Device;Vestibular;Visual/perceptual remediation/compensation;Spinal Manipulations    PT Next Visit Plan vestibular reassessment & treatment as indicated; lumbopelvic strengthening and stabilization - HEP review/update PRN; manual therapy & modalities for pain including ionto patch and/or taping as benefit noted for B hip/greater trochanter pain; balance training    PT Home Exercise Plan 6/24 - supported squat/eccentric stand>sit with yellow TB hip ABD isometric, seated yellow TB clam & marching; 7/23 - piriformis, ITB, hip flexor & HS stretches; 7/28 - yellow TB hook lying clam shell    Consulted and Agree with Plan of Care Patient           Patient will benefit from skilled therapeutic intervention in order to improve the following deficits and impairments:  Abnormal gait, Decreased activity tolerance, Decreased balance, Decreased coordination, Decreased endurance, Decreased knowledge of precautions, Decreased mobility, Decreased safety awareness, Decreased strength, Difficulty walking, Dizziness, Increased fascial restricitons, Increased muscle spasms, Impaired perceived functional ability, Impaired flexibility, Improper body mechanics, Postural dysfunction, Pain  Visit Diagnosis: Repeated falls  Unsteadiness on feet  Muscle weakness (generalized)  Other abnormalities of gait and mobility  Dizziness and giddiness  Chronic  bilateral low back pain without sciatica     Problem List Patient Active Problem List   Diagnosis Date Noted  . OSA and COPD overlap syndrome ([place]) 06/10/2020  . Serotonin withdrawal syndrome without complication 43/15/4008  . Recurrent falls 05/02/2020  . Statin intolerance 02/29/2020  . RLS (restless legs syndrome) 11/09/2019  . Kidney stone 02/26/2019  . Recurrent sinusitis 02/25/2019  . Hx of colonic polyp 02/25/2019  . DDD (degenerative disc disease), cervical 09/17/2018  . Degenerative scoliosis 04/22/2018  . Primary insomnia 06/25/2017  . Chronic interstitial cystitis 02/01/2017  . Cough 01/09/2017  . Right arm pain 07/20/2016  . Deviated septum 05/12/2016  . Nasal turbinate hypertrophy 05/12/2016  . Dysphagia   . Allergic rhinitis 01/25/2016  . Other and combined forms of senile cataract 07/15/2015  . Dyspnea   . Left hip pain 04/30/2015  . Sciatica 04/30/2015  . Knee pain, right 04/30/2015  . Bilateral carpal tunnel syndrome 03/04/2015  . Hyperlipidemia 03/01/2015  . Pulmonary nodules 02/12/2015  . External hemorrhoids 02/05/2015  . Chronic night sweats 01/08/2015  . Atypical chest pain 01/01/2015  . Renal insufficiency 07/16/2014  . Memory loss 05/22/2014  . Peripheral neuropathy 05/22/2014  . Abdominal pain 10/26/2013  . Headache 10/13/2013  . Arthralgia 08/18/2013  . ANA positive 07/29/2013  . Depression 02/05/2013  . Family history of malignant hyperthermia 02/05/2013  . Malignant neoplasm of  lower-outer quadrant of right breast of female, estrogen receptor positive ([place]) 01/03/2013  . Splenic lesion 09/02/2012  . Asthma, allergic 08/05/2012  . GERD (gastroesophageal reflux disease) 06/03/2012  . Edema 04/08/2012  . Stricture and stenosis of esophagus 10/19/2011  . Functional constipation 07/21/2011  . Nonspecific (abnormal) findings on radiological and other examination of biliary tract 07/21/2011  . Osteoporosis 04/17/2011  . Leg pain 02/24/2011    . FIBROCYSTIC BREAST DISEASE, HX OF 10/18/2010  . Essential hypertension 08/25/2010  . CAD in native artery 08/25/2010  . TOBACCO ABUSE, HX OF 08/25/2010  . GENERALIZED ANXIETY DISORDER 08/03/2010  . Fibromyalgia 01/11/2010    Percival Spanish, PT, MPT 08/11/2020, 10:07 AM  [facility] [address]  [place], [place], [address] Phone: [phone]   Fax:  [phone]  Name: CAMARIA GERALD MRN: [id number] Date of Birth: [birth year]

## 2020-08-12 ENCOUNTER — Ambulatory Visit: Payer: Medicare Other | Admitting: Physical Therapy

## 2020-08-12 ENCOUNTER — Encounter: Payer: Self-pay | Admitting: Physical Therapy

## 2020-08-12 DIAGNOSIS — R42 Dizziness and giddiness: Secondary | ICD-10-CM | POA: Diagnosis not present

## 2020-08-12 DIAGNOSIS — M6281 Muscle weakness (generalized): Secondary | ICD-10-CM

## 2020-08-12 DIAGNOSIS — M545 Low back pain: Secondary | ICD-10-CM | POA: Diagnosis not present

## 2020-08-12 DIAGNOSIS — R2681 Unsteadiness on feet: Secondary | ICD-10-CM

## 2020-08-12 DIAGNOSIS — R296 Repeated falls: Secondary | ICD-10-CM | POA: Diagnosis not present

## 2020-08-12 DIAGNOSIS — G8929 Other chronic pain: Secondary | ICD-10-CM | POA: Diagnosis not present

## 2020-08-12 DIAGNOSIS — R2689 Other abnormalities of gait and mobility: Secondary | ICD-10-CM

## 2020-08-12 NOTE — Therapy (Signed)
[facility] [address]  [place], [place], [address] Phone: [phone]   Fax:  [phone]  Physical Therapy Treatment  Patient Details  Name: [name] MRN: [id number] Date of Birth: [birth year] Referring Provider (PT): [name], MD   Encounter Date: 08/12/2020   PT End of Session - 08/12/20 1535    Visit Number 15    Number of Visits 25    Date for PT Re-Evaluation 09/17/20    Authorization Type [employer]    Progress Note Due on Visit 23   Recert on visit #58 - 8/50/27   PT Start Time 1445    PT Stop Time 1529    PT Time Calculation (min) 44 min    Activity Tolerance Patient tolerated treatment well    Behavior During Therapy [facility] for tasks assessed/performed           Past Medical History:  Diagnosis Date  . Allergy   . Anemia   . Anxiety    past hx   . Arthritis   . Asthma   . Atrial flutter ([place])    past history- not current  . CAD (coronary artery disease) CARDIOLOGIST--  DR Angelena Form   mild non-obstructive cad  . Cancer ([place])    right  . Cataract    bilaterally removed   . Chronic constipation   . Chronic kidney disease    interstitial cystitis  . COPD (chronic obstructive pulmonary disease) ([place])   . Depression    past hx   . Family history of malignant hyperthermia    father had this  . Fibromyalgia   . Frequency of urination   . GERD (gastroesophageal reflux disease)   . H/O hiatal hernia   . History of basal cell carcinoma excision    X2  . History of breast cancer ONCOLOGIST-- DR Jana Hakim---  NO RECURRANCE   DX 07/2012;  LOW GRADE DCIS  ER+PR+  ----  S/P RIGHT LUMPECTOMY WITH NEGATIVE MARGINS/   RADIATION ENDED 11/2012  . History of chronic bronchitis   . History of colonic polyps   . History of tachycardia    CONTROLLED  WITH ATENOLOL  . Hyperlipidemia   . Hypertension   . Neuromuscular disorder (HCC)    fibromyalgia  . Osteoporosis 01/2019   T score -2.2  stable/improved from prior study  . Pelvic pain   . Personal history of radiation therapy   . S/P radiation therapy 11/12/12 - 12/05/12   right Breast  . Sepsis ([place]) 2014   from UTI   . Sinus headache   . Urgency of urination     Past Surgical History:  Procedure Laterality Date  . [place] STUDY N/A 04/03/2016   Procedure: [place] STUDY;  Surgeon: Manus Gunning, MD;  Location: [facility];  Service: Gastroenterology;  Laterality: N/A;  . BREAST LUMPECTOMY Right 10-11-2012   W/ SLN BX  . CARDIAC CATHETERIZATION  09-13-2007  DR Lia Foyer   WELL-PRESERVED LVF/  DIFFUSE SCATTERED CORONARY CALCIFACATION AND ATHEROSCLEROSIS WITHOUT OBSTRUCTION  . CARDIAC CATHETERIZATION  08-04-2010  DR Angelena Form   NON-OBSTRUCTIVE CAD/  pLAD 40%/  oLAD 30%/  mLAD 30%/  pRCA 30%/  EF 60%  . CARDIOVASCULAR STRESS TEST  06-18-2012  DR McALHANY   LOW RISK NUCLEAR STUDY/  SMALL FIXED AREA OF MODERATELY DECREASED UPTAKE IN ANTEROSEPTAL WALL WHICH MAY BE ARTIFACTUAL/  NO ISCHEMIA/  EF 68%  . COLONOSCOPY  09-29-2010  .  CYSTOSCOPY    . CYSTOSCOPY WITH HYDRODISTENSION AND BIOPSY N/A 03/06/2014   Procedure: CYSTOSCOPY/HYDRODISTENSION/ INSTILATION OF MARCAINE AND PYRIDIUM;  Surgeon: Ailene Rud, MD;  [facility];  Service: Urology;  Laterality: N/A;  . ESOPHAGEAL MANOMETRY N/A 04/03/2016   Procedure: ESOPHAGEAL MANOMETRY (EM);  Surgeon: Manus Gunning, MD;  Location: [facility];  Service: Gastroenterology;  Laterality: N/A;  . EXTRACORPOREAL SHOCK WAVE LITHOTRIPSY Left 02/06/2019   Procedure: EXTRACORPOREAL SHOCK WAVE LITHOTRIPSY (ESWL);  Surgeon: Kathie Rhodes, MD;  Location: [facility];  Service: Urology;  Laterality: Left;  . NASAL SINUS SURGERY  1985  . ORIF RIGHT ANKLE  FX  2006  . POLYPECTOMY    . REMOVAL VOCAL CORD CYST  FEB 2014  . RIGHT BREAST BX  08-23-2012  . RIGHT HAND SURGERY  X3  LAST ONE 2009   INCLUDES  ORIF RIGHT 5TH FINGER AND REVISION TWICE  .  SKIN CANCER EXCISION    . TONSILLECTOMY AND ADENOIDECTOMY  AGE 60  . TOTAL ABDOMINAL HYSTERECTOMY W/ BILATERAL SALPINGOOPHORECTOMY  1982   W/  APPENDECTOMY  . TRANSTHORACIC ECHOCARDIOGRAM  06-24-2012   GRADE I DIASTOLIC DYSFUNCTION/  EF 55-60%/  MILD MR  . UPPER GASTROINTESTINAL ENDOSCOPY      There were no vitals filed for this visit.   Subjective Assessment - 08/12/20 1446    Subjective Felt better after the previous BPPV manuever, but this AM when she woke up she felt dizzy. This has gotten worse throughout the day- worse when turning from the washer to the dryer and bending down when doing laundry.    Pertinent History Recurrent falls - 2 in past 6 months (past 5 weeks), 6 major falls in past 5-6 yrs (2 concussions, finger fracture); costochondritis, cellulitis of R breast, fibromyalgia, chronic LBP since ~age 61, DDD, sciatica, osteoporosis, degenerative scoliosis, peripheral neuropathy, vertigo, R knee pain, CAD, COPD, HTN, h/o lumpectomy for R breast cancer 2013, ORIF R ankle s/p fall 2006    Patient Stated Goals "Be able to gain some strength in my legs to help with my back. To be able stand w/o feeling like I'm swaying and walk w/o tripping."    Currently in Pain? Yes    Pain Score 3    Pain Location Neck    Pain Orientation Left    Pain Descriptors / Indicators Aching    Pain Type Chronic pain                   Vestibular Assessment - 08/12/20 0001      Positional Testing   Sidelying Test Sidelying Right;Sidelying Left    Horizontal Canal Testing Horizontal Canal Right;Horizontal Canal Left      Dix-Hallpike Right   Dix-Hallpike Right Symptoms No nystagmus      Dix-Hallpike Left   Dix-Hallpike Left Duration 1 min    Dix-Hallpike Left Symptoms --   unable to discern geotrophic or L upbeat/torsion     Sidelying Right   Sidelying Right Duration none    Sidelying Right Symptoms No nystagmus      Sidelying Left   Sidelying Left Duration none    Sidelying Left  Symptoms No nystagmus      Horizontal Canal Right   Horizontal Canal Right Duration 1 minute+    Horizontal Canal Right Symptoms Geotrophic      Horizontal Canal Left   Horizontal Canal Left Duration 1 minute+    Horizontal Canal Left Symptoms Geotrophic  Vestibular Treatment/Exercise - 08/12/20 0001      Vestibular Treatment/Exercise   Canalith Repositioning Comment   modified L BBQ Roll                PT Education - 08/12/20 1534    Education Details update and edu on proper HEP performance, edu on vestibular hypofunction and horizontal canal BPPV    Person(s) Educated Patient    Methods Explanation;Demonstration;Tactile cues;Verbal cues;Handout    Comprehension Verbalized understanding;Returned demonstration            PT Short Term Goals - 06/22/20 1015      PT SHORT TERM GOAL #1   Title Patient will be independent with initial HEP    Status Achieved   06/22/20     PT SHORT TERM GOAL #2   Title Complete FGA assessment    Status Achieved   06/22/20            PT Long Term Goals - 08/06/20 1034      PT LONG TERM GOAL #1   Title Patient will be independent with ongoing/advanced HEP    Status Partially Met    Target Date 09/17/20      PT LONG TERM GOAL #2   Title Patient will demonstrate improved B LE strength to >/= 4/5 to 4+/5 for improved stability and ease of mobility    Status Partially Met    Target Date 09/17/20      PT LONG TERM GOAL #3   Title Patient will improve Berg score to >/= 52/56 to improve safety stability with ADLs in standing and reduce risk for falls    Status On-going   08/06/20 Merrilee Jansky = 49/56   Target Date 09/17/20      PT LONG TERM GOAL #4   Title Patient will report no further instances of falls    Status Partially Met   08/06/20 - No falls since start of PT, but continues to experience R ankle instability and vestibular issues which increase her risk for falls   Target Date 09/17/20      PT  LONG TERM GOAL #5   Title Patient will improve FGA score to >/= 20/30 to improve gait stability and reduce risk for falls    Status On-going   08/06/20 - FGA = 19/30   Target Date 09/17/20      PT LONG TERM GOAL #6   Title Patient to report no dizziness remaining with bed mobility.    Time 1    Period Weeks    Status On-going    Target Date 08/10/20                 Plan - 08/12/20 1536    Clinical Impression Statement Patient arrived with report of feeling better after the previous canalith repositioning maneuver, but this AM when she woke up she felt dizzy. This has gotten worse throughout the day- worse when turning from the washer to the dryer and bending down when doing laundry. Patient with slight geotropic nystagmus to R and L roll test and with indiscernible nystagmus with L DH. However, patient asymptomatic with all testing. Patient without spontaneous nystagmus at rest, thus treated patient with L modified BBQ roll. No c/o dizziness after maneuver. D/t patient's hx of concussion and positive oculomotor exam, believe a component of patient's dizziness is hypofunction-related. Discussed proper technique and intensity of VOR exercise in sitting for safety. Patient reported understanding and without complaints at end of session.  Personal Factors and Comorbidities Comorbidity 3+;Age;Fitness;Past/Current Experience    Comorbidities Recurrent falls - 2 in past 6 months, 6 major falls in past 5-6 yrs (2 concussions, finger fracture); costochondritis, cellulitis of R breast, fibromyalgia, chronic LBP since ~age 26, DDD, sciatica, osteoporosis, degenerative scoliosis, peripheral neuropathy, vertigo, R knee pain, CAD, COPD, HTN, h/o lumpectomy for R breast cancer 2013, ORIF R ankle s/p fall 2006    Examination-Activity Limitations Bathing;Bend;Carry;Dressing;Lift;Locomotion Level;Stand    Examination-Participation Restrictions Church;Community Activity;Interpersonal  Relationship;Laundry;Meal Prep;Shop;Yard Work    Rehab Potential Good    PT Frequency 2x / week    PT Duration 6 weeks    PT Treatment/Interventions ADLs/Self Care Home Management;Cryotherapy;Electrical Stimulation;Iontophoresis 4mg /ml Dexamethasone;Moist Heat;Ultrasound;DME Instruction;Gait training;Stair training;Functional mobility training;Therapeutic activities;Therapeutic exercise;Balance training;Neuromuscular re-education;Patient/family education;Manual techniques;Passive range of motion;Dry needling;Taping;Vasopneumatic Device;Vestibular;Visual/perceptual remediation/compensation;Spinal Manipulations    PT Next Visit Plan vestibular reassessment & treatment as indicated; lumbopelvic strengthening and stabilization - HEP review/update PRN; manual therapy & modalities for pain including ionto patch and/or taping as benefit noted for B hip/greater trochanter pain; balance training    PT Home Exercise Plan 6/24 - supported squat/eccentric stand>sit with yellow TB hip ABD isometric, seated yellow TB clam & marching; 7/23 - piriformis, ITB, hip flexor & HS stretches; 7/28 - yellow TB hook lying clam shell    Consulted and Agree with Plan of Care Patient           Patient will benefit from skilled therapeutic intervention in order to improve the following deficits and impairments:  Abnormal gait, Decreased activity tolerance, Decreased balance, Decreased coordination, Decreased endurance, Decreased knowledge of precautions, Decreased mobility, Decreased safety awareness, Decreased strength, Difficulty walking, Dizziness, Increased fascial restricitons, Increased muscle spasms, Impaired perceived functional ability, Impaired flexibility, Improper body mechanics, Postural dysfunction, Pain  Visit Diagnosis: Repeated falls  Unsteadiness on feet  Muscle weakness (generalized)  Other abnormalities of gait and mobility  Dizziness and giddiness  Chronic bilateral low back pain without  sciatica     Problem List Patient Active Problem List   Diagnosis Date Noted  . OSA and COPD overlap syndrome ([place]) 06/10/2020  . Serotonin withdrawal syndrome without complication 23/55/7322  . Recurrent falls 05/02/2020  . Statin intolerance 02/29/2020  . RLS (restless legs syndrome) 11/09/2019  . Kidney stone 02/26/2019  . Recurrent sinusitis 02/25/2019  . Hx of colonic polyp 02/25/2019  . DDD (degenerative disc disease), cervical 09/17/2018  . Degenerative scoliosis 04/22/2018  . Primary insomnia 06/25/2017  . Chronic interstitial cystitis 02/01/2017  . Cough 01/09/2017  . Right arm pain 07/20/2016  . Deviated septum 05/12/2016  . Nasal turbinate hypertrophy 05/12/2016  . Dysphagia   . Allergic rhinitis 01/25/2016  . Other and combined forms of senile cataract 07/15/2015  . Dyspnea   . Left hip pain 04/30/2015  . Sciatica 04/30/2015  . Knee pain, right 04/30/2015  . Bilateral carpal tunnel syndrome 03/04/2015  . Hyperlipidemia 03/01/2015  . Pulmonary nodules 02/12/2015  . External hemorrhoids 02/05/2015  . Chronic night sweats 01/08/2015  . Atypical chest pain 01/01/2015  . Renal insufficiency 07/16/2014  . Memory loss 05/22/2014  . Peripheral neuropathy 05/22/2014  . Abdominal pain 10/26/2013  . Headache 10/13/2013  . Arthralgia 08/18/2013  . ANA positive 07/29/2013  . Depression 02/05/2013  . Family history of malignant hyperthermia 02/05/2013  . Malignant neoplasm of lower-outer quadrant of right breast of female, estrogen receptor positive ([place]) 01/03/2013  . Splenic lesion 09/02/2012  . Asthma, allergic 08/05/2012  . GERD (gastroesophageal reflux disease) 06/03/2012  . Edema  04/08/2012  . Stricture and stenosis of esophagus 10/19/2011  . Functional constipation 07/21/2011  . Nonspecific (abnormal) findings on radiological and other examination of biliary tract 07/21/2011  . Osteoporosis 04/17/2011  . Leg pain 02/24/2011  . FIBROCYSTIC BREAST DISEASE,  HX OF 10/18/2010  . Essential hypertension 08/25/2010  . CAD in native artery 08/25/2010  . TOBACCO ABUSE, HX OF 08/25/2010  . GENERALIZED ANXIETY DISORDER 08/03/2010  . Fibromyalgia 01/11/2010     Janene Harvey, PT, DPT 08/12/20 4:18 PM   [facility] [address]  [place], [place], [address] Phone: [phone]   Fax:  [phone]  Name: [name] MRN: [id number] Date of Birth: November 06, 1946

## 2020-08-24 ENCOUNTER — Encounter: Payer: Self-pay | Admitting: Physical Therapy

## 2020-08-24 ENCOUNTER — Ambulatory Visit: Payer: Medicare Other | Admitting: Physical Therapy

## 2020-08-24 ENCOUNTER — Other Ambulatory Visit: Payer: Self-pay

## 2020-08-24 VITALS — BP 132/72 | HR 81

## 2020-08-24 DIAGNOSIS — R42 Dizziness and giddiness: Secondary | ICD-10-CM

## 2020-08-24 DIAGNOSIS — G8929 Other chronic pain: Secondary | ICD-10-CM | POA: Diagnosis not present

## 2020-08-24 DIAGNOSIS — R2681 Unsteadiness on feet: Secondary | ICD-10-CM

## 2020-08-24 DIAGNOSIS — M6281 Muscle weakness (generalized): Secondary | ICD-10-CM

## 2020-08-24 DIAGNOSIS — R296 Repeated falls: Secondary | ICD-10-CM | POA: Diagnosis not present

## 2020-08-24 DIAGNOSIS — M545 Low back pain, unspecified: Secondary | ICD-10-CM

## 2020-08-24 DIAGNOSIS — R2689 Other abnormalities of gait and mobility: Secondary | ICD-10-CM | POA: Diagnosis not present

## 2020-08-24 NOTE — Therapy (Signed)
[name] [address]  [place], [place], [address] Phone: [phone]   Fax:  [phone]  Physical Therapy Treatment  Patient Details  Name: [name] MRN: [id number] Date of Birth: 1946/12/14 Referring Provider (PT): [name], MD   Encounter Date: 08/24/2020   PT End of Session - 08/24/20 0847    Visit Number 16    Number of Visits 25    Date for PT Re-Evaluation 09/17/20    Authorization Type [employer]    Progress Note Due on Visit 23   Recert on visit #73 - 07/03/61   PT Start Time 0847    PT Stop Time 0929    PT Time Calculation (min) 42 min    Activity Tolerance Patient tolerated treatment well    Behavior During Therapy [facility] for tasks assessed/performed           Past Medical History:  Diagnosis Date  . Allergy   . Anemia   . Anxiety    past hx   . Arthritis   . Asthma   . Atrial flutter ([place])    past history- not current  . CAD (coronary artery disease) CARDIOLOGIST--  DR Angelena Form   mild non-obstructive cad  . Cancer ([place])    right  . Cataract    bilaterally removed   . Chronic constipation   . Chronic kidney disease    interstitial cystitis  . COPD (chronic obstructive pulmonary disease) ([place])   . Depression    past hx   . Family history of malignant hyperthermia    father had this  . Fibromyalgia   . Frequency of urination   . GERD (gastroesophageal reflux disease)   . H/O hiatal hernia   . History of basal cell carcinoma excision    X2  . History of breast cancer ONCOLOGIST-- DR Jana Hakim---  NO RECURRANCE   DX 07/2012;  LOW GRADE DCIS  ER+PR+  ----  S/P RIGHT LUMPECTOMY WITH NEGATIVE MARGINS/   RADIATION ENDED 11/2012  . History of chronic bronchitis   . History of colonic polyps   . History of tachycardia    CONTROLLED  WITH ATENOLOL  . Hyperlipidemia   . Hypertension   . Neuromuscular disorder (HCC)    fibromyalgia  . Osteoporosis 01/2019   T score -2.2  stable/improved from prior study  . Pelvic pain   . Personal history of radiation therapy   . S/P radiation therapy 11/12/12 - 12/05/12   right Breast  . Sepsis ([place]) 2014   from UTI   . Sinus headache   . Urgency of urination     Past Surgical History:  Procedure Laterality Date  . [place] STUDY N/A 04/03/2016   Procedure: [place] STUDY;  Surgeon: Manus Gunning, MD;  Location: [facility];  Service: Gastroenterology;  Laterality: N/A;  . BREAST LUMPECTOMY Right 10-11-2012   W/ SLN BX  . CARDIAC CATHETERIZATION  09-13-2007  DR Lia Foyer   WELL-PRESERVED LVF/  DIFFUSE SCATTERED CORONARY CALCIFACATION AND ATHEROSCLEROSIS WITHOUT OBSTRUCTION  . CARDIAC CATHETERIZATION  08-04-2010  DR Angelena Form   NON-OBSTRUCTIVE CAD/  pLAD 40%/  oLAD 30%/  mLAD 30%/  pRCA 30%/  EF 60%  . CARDIOVASCULAR STRESS TEST  06-18-2012  DR McALHANY   LOW RISK NUCLEAR STUDY/  SMALL FIXED AREA OF MODERATELY DECREASED UPTAKE IN ANTEROSEPTAL WALL WHICH MAY BE ARTIFACTUAL/  NO ISCHEMIA/  EF 68%  . COLONOSCOPY  09-29-2010  .  CYSTOSCOPY    . CYSTOSCOPY WITH HYDRODISTENSION AND BIOPSY N/A 03/06/2014   Procedure: CYSTOSCOPY/HYDRODISTENSION/ INSTILATION OF MARCAINE AND PYRIDIUM;  Surgeon: Ailene Rud, MD;  Location: [facility];  Service: Urology;  Laterality: N/A;  . ESOPHAGEAL MANOMETRY N/A 04/03/2016   Procedure: ESOPHAGEAL MANOMETRY (EM);  Surgeon: Manus Gunning, MD;  Location: [facility];  Service: Gastroenterology;  Laterality: N/A;  . EXTRACORPOREAL SHOCK WAVE LITHOTRIPSY Left 02/06/2019   Procedure: EXTRACORPOREAL SHOCK WAVE LITHOTRIPSY (ESWL);  Surgeon: Kathie Rhodes, MD;  Location: [facility];  Service: Urology;  Laterality: Left;  . NASAL SINUS SURGERY  1985  . ORIF RIGHT ANKLE  FX  2006  . POLYPECTOMY    . REMOVAL VOCAL CORD CYST  FEB 2014  . RIGHT BREAST BX  08-23-2012  . RIGHT HAND SURGERY  X3  LAST ONE 2009   INCLUDES  ORIF RIGHT 5TH FINGER AND REVISION TWICE  .  SKIN CANCER EXCISION    . TONSILLECTOMY AND ADENOIDECTOMY  AGE 33  . TOTAL ABDOMINAL HYSTERECTOMY W/ BILATERAL SALPINGOOPHORECTOMY  1982   W/  APPENDECTOMY  . TRANSTHORACIC ECHOCARDIOGRAM  06-24-2012   GRADE I DIASTOLIC DYSFUNCTION/  EF 55-60%/  MILD MR  . UPPER GASTROINTESTINAL ENDOSCOPY      Vitals:   08/24/20 0852  BP: 132/72  Pulse: 81  SpO2: 97%     Subjective Assessment - 08/24/20 0852    Subjective Pt returning from her trip last week - 1900 miles in the car. Reports she managed well with her back during the trip. Reports a little dizziness the first of last week but none since. She reports she fell into doorframe and caught her knee on the door at home prior to leaving on vacation - did not hit the floor.    Pertinent History Recurrent falls - 2 in past 6 months (past 5 weeks), 6 major falls in past 5-6 yrs (2 concussions, finger fracture); costochondritis, cellulitis of R breast, fibromyalgia, chronic LBP since ~age 67, DDD, sciatica, osteoporosis, degenerative scoliosis, peripheral neuropathy, vertigo, R knee pain, CAD, COPD, HTN, h/o lumpectomy for R breast cancer 2013, ORIF R ankle s/p fall 2006    Patient Stated Goals "Be able to gain some strength in my legs to help with my back. To be able stand w/o feeling like I'm swaying and walk w/o tripping."    Currently in Pain? Yes    Pain Score 4    4-5/10   Pain Location Back    Pain Orientation Lower    Pain Descriptors / Indicators Constant;Pressure;Aching    Pain Type Chronic pain    Pain Frequency Constant                             [facility] Adult PT Treatment/Exercise - 08/24/20 0847      Knee/Hip Exercises: Aerobic   Tread Mill 2.1 mph x 6 min               Balance Exercises - 08/24/20 0847      Balance Exercises: Standing   Standing Eyes Opened Narrow base of support (BOS);Solid surface;30 secs;Head turns;5 reps   romberg stance   Standing Eyes Closed Narrow base of support (BOS);Solid  surface;20 secs;Head turns;5 reps   romberg stance   Partial Tandem Stance 30 secs;Eyes open;Cognitive challenge;5 reps;Intermittent upper extremity support;Eyes closed;10 secs   head turns - side/side, up/down  PT Education - 08/24/20 0929    Education Details HEP update - static corner balance balance progression    Person(s) Educated Patient    Methods Explanation;Demonstration;Verbal cues;Handout    Comprehension Verbalized understanding;Verbal cues required;Returned demonstration;Need further instruction            PT Short Term Goals - 06/22/20 1015      PT SHORT TERM GOAL #1   Title Patient will be independent with initial HEP    Status Achieved   06/22/20     PT SHORT TERM GOAL #2   Title Complete FGA assessment    Status Achieved   06/22/20            PT Long Term Goals - 08/24/20 0855      PT LONG TERM GOAL #1   Title Patient will be independent with ongoing/advanced HEP    Status Partially Met      PT LONG TERM GOAL #2   Title Patient will demonstrate improved B LE strength to >/= 4/5 to 4+/5 for improved stability and ease of mobility    Status Partially Met    Target Date 09/17/20      PT LONG TERM GOAL #3   Title Patient will improve Berg score to >/= 52/56 to improve safety stability with ADLs in standing and reduce risk for falls    Status On-going   08/06/20 Merrilee Jansky = 49/56   Target Date 09/17/20      PT LONG TERM GOAL #4   Title Patient will report no further instances of falls    Status Partially Met   08/24/20 - reports a fall walking in home prior to leaving on vacation   Target Date 09/17/20      PT LONG TERM GOAL #5   Title Patient will improve FGA score to >/= 20/30 to improve gait stability and reduce risk for falls    Status On-going   08/06/20 - FGA = 19/30   Target Date 09/17/20      PT LONG TERM GOAL #6   Title Patient to report no dizziness remaining with bed mobility.    Status Achieved   08/24/20                 Plan - 08/24/20 0904    Clinical Impression Statement [name] reports she did well with her vestibular issues and low back pain while traveling last week but did note a partial fall in her home prior to leaving on vacation (fell into doorframe and door). She does report LBP today but feels that it is at baseline and would like to focus on balance activities today, therefore introduced static corner balance progression with narrow BOS and semi-tandem stances. She was able to complete horizontal and vertical head in Romberg stance with both eyes open and closed but had more difficulty with progression in semi-tandem stance. HEP instructions provided for progression of corner balance activities but cautioned pt on safe set-up with chair in front of her and instructed her to limit progression just to levels attempted during therapy session.    Personal Factors and Comorbidities Comorbidity 3+;Age;Fitness;Past/Current Experience    Comorbidities Recurrent falls - 2 in past 6 months, 6 major falls in past 5-6 yrs (2 concussions, finger fracture); costochondritis, cellulitis of R breast, fibromyalgia, chronic LBP since ~age 65, DDD, sciatica, osteoporosis, degenerative scoliosis, peripheral neuropathy, vertigo, R knee pain, CAD, COPD, HTN, h/o lumpectomy for R breast cancer 2013, ORIF R ankle s/p fall 2006  Examination-Activity Limitations Bathing;Bend;Carry;Dressing;Lift;Locomotion Level;Stand    Examination-Participation Restrictions Church;Community Activity;Interpersonal Relationship;Laundry;Meal Prep;Shop;Yard Work    Rehab Potential Good    PT Frequency 2x / week    PT Duration 6 weeks    PT Treatment/Interventions ADLs/Self Care Home Management;Cryotherapy;Electrical Stimulation;Iontophoresis 4mg /ml Dexamethasone;Moist Heat;Ultrasound;DME Instruction;Gait training;Stair training;Functional mobility training;Therapeutic activities;Therapeutic exercise;Balance training;Neuromuscular  re-education;Patient/family education;Manual techniques;Passive range of motion;Dry needling;Taping;Vasopneumatic Device;Vestibular;Visual/perceptual remediation/compensation;Spinal Manipulations    PT Next Visit Plan lumbopelvic strengthening and stabilization - HEP review/update PRN; balance training; manual therapy & modalities for pain including ionto patch and/or taping as benefit noted for B hip/greater trochanter pain; balance training; vestibular reassessment & treatment as indicated    PT Home Exercise Plan 6/24 - supported squat/eccentric stand>sit with yellow TB hip ABD isometric, seated yellow TB clam & marching; 7/23 - piriformis, ITB, hip flexor & HS stretches; 7/28 - yellow TB hook lying clam shell    Consulted and Agree with Plan of Care Patient           Patient will benefit from skilled therapeutic intervention in order to improve the following deficits and impairments:  Abnormal gait, Decreased activity tolerance, Decreased balance, Decreased coordination, Decreased endurance, Decreased knowledge of precautions, Decreased mobility, Decreased safety awareness, Decreased strength, Difficulty walking, Dizziness, Increased fascial restricitons, Increased muscle spasms, Impaired perceived functional ability, Impaired flexibility, Improper body mechanics, Postural dysfunction, Pain  Visit Diagnosis: Repeated falls  Unsteadiness on feet  Muscle weakness (generalized)  Other abnormalities of gait and mobility  Dizziness and giddiness  Chronic bilateral low back pain without sciatica     Problem List Patient Active Problem List   Diagnosis Date Noted  . OSA and COPD overlap syndrome ([place]) 06/10/2020  . Serotonin withdrawal syndrome without complication 35/57/3220  . Recurrent falls 05/02/2020  . Statin intolerance 02/29/2020  . RLS (restless legs syndrome) 11/09/2019  . Kidney stone 02/26/2019  . Recurrent sinusitis 02/25/2019  . Hx of colonic polyp 02/25/2019  . DDD  (degenerative disc disease), cervical 09/17/2018  . Degenerative scoliosis 04/22/2018  . Primary insomnia 06/25/2017  . Chronic interstitial cystitis 02/01/2017  . Cough 01/09/2017  . Right arm pain 07/20/2016  . Deviated septum 05/12/2016  . Nasal turbinate hypertrophy 05/12/2016  . Dysphagia   . Allergic rhinitis 01/25/2016  . Other and combined forms of senile cataract 07/15/2015  . Dyspnea   . Left hip pain 04/30/2015  . Sciatica 04/30/2015  . Knee pain, right 04/30/2015  . Bilateral carpal tunnel syndrome 03/04/2015  . Hyperlipidemia 03/01/2015  . Pulmonary nodules 02/12/2015  . External hemorrhoids 02/05/2015  . Chronic night sweats 01/08/2015  . Atypical chest pain 01/01/2015  . Renal insufficiency 07/16/2014  . Memory loss 05/22/2014  . Peripheral neuropathy 05/22/2014  . Abdominal pain 10/26/2013  . Headache 10/13/2013  . Arthralgia 08/18/2013  . ANA positive 07/29/2013  . Depression 02/05/2013  . Family history of malignant hyperthermia 02/05/2013  . Malignant neoplasm of lower-outer quadrant of right breast of female, estrogen receptor positive ([place]) 01/03/2013  . Splenic lesion 09/02/2012  . Asthma, allergic 08/05/2012  . GERD (gastroesophageal reflux disease) 06/03/2012  . Edema 04/08/2012  . Stricture and stenosis of esophagus 10/19/2011  . Functional constipation 07/21/2011  . Nonspecific (abnormal) findings on radiological and other examination of biliary tract 07/21/2011  . Osteoporosis 04/17/2011  . Leg pain 02/24/2011  . FIBROCYSTIC BREAST DISEASE, HX OF 10/18/2010  . Essential hypertension 08/25/2010  . CAD in native artery 08/25/2010  . TOBACCO ABUSE, HX OF 08/25/2010  . GENERALIZED ANXIETY DISORDER  08/03/2010  . Fibromyalgia 01/11/2010    Percival Spanish, PT, MPT 08/24/2020, 3:54 PM  [facility] [address]  [place], [place], [address] Phone: [phone]   Fax:   [phone]  Name: [name] MRN: [id number] Date of Birth: 1946/10/29

## 2020-08-24 NOTE — Patient Instructions (Signed)
    Home exercise program created by Modean Mccullum, PT.  For questions, please contact Labrittany Wechter via phone at [phone] or email at [email or web address]  [facility] [place] [address]  [address] [place], [place], [address] Phone: [phone]   Fax:  [phone]    

## 2020-08-27 ENCOUNTER — Other Ambulatory Visit: Payer: Self-pay

## 2020-08-27 ENCOUNTER — Other Ambulatory Visit: Payer: Self-pay | Admitting: Family Medicine

## 2020-08-27 ENCOUNTER — Encounter: Payer: Self-pay | Admitting: Physical Therapy

## 2020-08-27 ENCOUNTER — Ambulatory Visit: Payer: Medicare Other | Attending: Family Medicine | Admitting: Physical Therapy

## 2020-08-27 VITALS — BP 138/72 | HR 90

## 2020-08-27 DIAGNOSIS — M6281 Muscle weakness (generalized): Secondary | ICD-10-CM | POA: Diagnosis not present

## 2020-08-27 DIAGNOSIS — R42 Dizziness and giddiness: Secondary | ICD-10-CM | POA: Diagnosis not present

## 2020-08-27 DIAGNOSIS — G44311 Acute post-traumatic headache, intractable: Secondary | ICD-10-CM | POA: Insufficient documentation

## 2020-08-27 DIAGNOSIS — R2689 Other abnormalities of gait and mobility: Secondary | ICD-10-CM

## 2020-08-27 DIAGNOSIS — R2681 Unsteadiness on feet: Secondary | ICD-10-CM | POA: Insufficient documentation

## 2020-08-27 DIAGNOSIS — G8929 Other chronic pain: Secondary | ICD-10-CM | POA: Insufficient documentation

## 2020-08-27 DIAGNOSIS — M545 Low back pain: Secondary | ICD-10-CM | POA: Insufficient documentation

## 2020-08-27 DIAGNOSIS — R296 Repeated falls: Secondary | ICD-10-CM | POA: Diagnosis not present

## 2020-08-27 NOTE — Therapy (Signed)
[facility] [address]  [place], [place], [address] Phone: [phone]   Fax:  [phone]  Physical Therapy Treatment  Patient Details  Name: [name] MRN: [id number] Date of Birth: 04-28-1946 Referring Provider (PT): Sheri Homans, MD   Encounter Date: 08/27/2020   PT End of Session - 08/27/20 0850    Visit Number 17    Number of Visits 25    Date for PT Re-Evaluation 09/17/20    Authorization Type [employer]    Progress Note Due on Visit 23   Recert on visit #64 - 3/32/95   PT Start Time 0850    PT Stop Time 0931    PT Time Calculation (min) 41 min    Activity Tolerance Patient tolerated treatment well    Behavior During Therapy [facility] for tasks assessed/performed           Past Medical History:  Diagnosis Date  . Allergy   . Anemia   . Anxiety    past hx   . Arthritis   . Asthma   . Atrial flutter ([place])    past history- not current  . CAD (coronary artery disease) CARDIOLOGIST--  DR Angelena Form   mild non-obstructive cad  . Cancer ([place])    right  . Cataract    bilaterally removed   . Chronic constipation   . Chronic kidney disease    interstitial cystitis  . COPD (chronic obstructive pulmonary disease) ([place])   . Depression    past hx   . Family history of malignant hyperthermia    father had this  . Fibromyalgia   . Frequency of urination   . GERD (gastroesophageal reflux disease)   . H/O hiatal hernia   . History of basal cell carcinoma excision    X2  . History of breast cancer ONCOLOGIST-- DR Jana Hakim---  NO RECURRANCE   DX 07/2012;  LOW GRADE DCIS  ER+PR+  ----  S/P RIGHT LUMPECTOMY WITH NEGATIVE MARGINS/   RADIATION ENDED 11/2012  . History of chronic bronchitis   . History of colonic polyps   . History of tachycardia    CONTROLLED  WITH ATENOLOL  . Hyperlipidemia   . Hypertension   . Neuromuscular disorder (HCC)    fibromyalgia  . Osteoporosis 01/2019   T score -2.2  stable/improved from prior study  . Pelvic pain   . Personal history of radiation therapy   . S/P radiation therapy 11/12/12 - 12/05/12   right Breast  . Sepsis ([place]) 2014   from UTI   . Sinus headache   . Urgency of urination     Past Surgical History:  Procedure Laterality Date  . [place] STUDY N/A 04/03/2016   Procedure: [place] STUDY;  Surgeon: Manus Gunning, MD;  Location: [facility];  Service: Gastroenterology;  Laterality: N/A;  . BREAST LUMPECTOMY Right 10-11-2012   W/ SLN BX  . CARDIAC CATHETERIZATION  09-13-2007  DR Lia Foyer   WELL-PRESERVED LVF/  DIFFUSE SCATTERED CORONARY CALCIFACATION AND ATHEROSCLEROSIS WITHOUT OBSTRUCTION  . CARDIAC CATHETERIZATION  08-04-2010  DR Angelena Form   NON-OBSTRUCTIVE CAD/  pLAD 40%/  oLAD 30%/  mLAD 30%/  pRCA 30%/  EF 60%  . CARDIOVASCULAR STRESS TEST  06-18-2012  DR McALHANY   LOW RISK NUCLEAR STUDY/  SMALL FIXED AREA OF MODERATELY DECREASED UPTAKE IN ANTEROSEPTAL WALL WHICH MAY BE ARTIFACTUAL/  NO ISCHEMIA/  EF 68%  . COLONOSCOPY  09-29-2010  .  CYSTOSCOPY    . CYSTOSCOPY WITH HYDRODISTENSION AND BIOPSY N/A 03/06/2014   Procedure: CYSTOSCOPY/HYDRODISTENSION/ INSTILATION OF MARCAINE AND PYRIDIUM;  Surgeon: Kathi Ludwig, MD;  Location: [facility];  Service: Urology;  Laterality: N/A;  . ESOPHAGEAL MANOMETRY N/A 04/03/2016   Procedure: ESOPHAGEAL MANOMETRY (EM);  Surgeon: Ruffin Frederick, MD;  Location: [facility];  Service: Gastroenterology;  Laterality: N/A;  . EXTRACORPOREAL SHOCK WAVE LITHOTRIPSY Left 02/06/2019   Procedure: EXTRACORPOREAL SHOCK WAVE LITHOTRIPSY (ESWL);  Surgeon: Ihor Gully, MD;  Location: [facility];  Service: Urology;  Laterality: Left;  . NASAL SINUS SURGERY  1985  . ORIF RIGHT ANKLE  FX  2006  . POLYPECTOMY    . REMOVAL VOCAL CORD CYST  FEB 2014  . RIGHT BREAST BX  08-23-2012  . RIGHT HAND SURGERY  X3  LAST ONE 2009   INCLUDES  ORIF RIGHT 5TH FINGER AND REVISION TWICE  .  SKIN CANCER EXCISION    . TONSILLECTOMY AND ADENOIDECTOMY  AGE 56  . TOTAL ABDOMINAL HYSTERECTOMY W/ BILATERAL SALPINGOOPHORECTOMY  1982   W/  APPENDECTOMY  . TRANSTHORACIC ECHOCARDIOGRAM  06-24-2012   GRADE I DIASTOLIC DYSFUNCTION/  EF 55-60%/  MILD MR  . UPPER GASTROINTESTINAL ENDOSCOPY      Vitals:   08/27/20 0902  BP: 138/72  Pulse: 90  SpO2: 98%     Subjective Assessment - 08/27/20 0852    Subjective [name] she misplaced the handout from last visit & requested reprint. She notes increased SOB during warmup today - VS WNL.    Pertinent History Recurrent falls - 2 in past 6 months (past 5 weeks), 6 major falls in past 5-6 yrs (2 concussions, finger fracture); costochondritis, cellulitis of R breast, fibromyalgia, chronic LBP since ~age 1, DDD, sciatica, osteoporosis, degenerative scoliosis, peripheral neuropathy, vertigo, R knee pain, CAD, COPD, HTN, h/o lumpectomy for R breast cancer 2013, ORIF R ankle s/p fall 2006    Patient Stated Goals "Be able to gain some strength in my legs to help with my back. To be able stand w/o feeling like I'm swaying and walk w/o tripping."    Currently in Pain? No/denies    Pain Score 4     Pain Location Back    Pain Orientation Lower    Pain Descriptors / Indicators Constant;Aching    Pain Type Chronic pain                             [facility] Adult PT Treatment/Exercise - 08/27/20 0850      High Level Balance   High Level Balance Activities Backward walking      Knee/Hip Exercises: Aerobic   Recumbent Bike L2 x 6 min               Balance Exercises - 08/27/20 0850      Balance Exercises: Standing   Tandem Stance Eyes open;4 reps;30 secs;Intermittent upper extremity support    SLS Eyes open;10 secs;3 reps;Intermittent upper extremity support   TTWB on opp LE   Partial Tandem Stance 30 secs;Eyes open;Cognitive challenge;5 reps;Intermittent upper extremity support;Eyes closed;10 secs   head turns - side/side,  up/down   Retro Gait 2 reps   40 ft - cues for increased stride length   Other Standing Exercises Environmental scanning while walking around gym for 11 cone retrieval - no LOB               PT Short Term  Goals - 06/22/20 1015      PT SHORT TERM GOAL #1   Title Patient will be independent with initial HEP    Status Achieved   06/22/20     PT SHORT TERM GOAL #2   Title Complete FGA assessment    Status Achieved   06/22/20            PT Long Term Goals - 08/24/20 0855      PT LONG TERM GOAL #1   Title Patient will be independent with ongoing/advanced HEP    Status Partially Met      PT LONG TERM GOAL #2   Title Patient will demonstrate improved B LE strength to >/= 4/5 to 4+/5 for improved stability and ease of mobility    Status Partially Met    Target Date 09/17/20      PT LONG TERM GOAL #3   Title Patient will improve Berg score to >/= 52/56 to improve safety stability with ADLs in standing and reduce risk for falls    Status On-going   08/06/20 Merrilee Jansky = 49/56   Target Date 09/17/20      PT LONG TERM GOAL #4   Title Patient will report no further instances of falls    Status Partially Met   08/24/20 - [name] a fall walking in home prior to leaving on vacation   Target Date 09/17/20      PT LONG TERM GOAL #5   Title Patient will improve FGA score to >/= 20/30 to improve gait stability and reduce risk for falls    Status On-going   08/06/20 - FGA = 19/30   Target Date 09/17/20      PT LONG TERM GOAL #6   Title Patient to report no dizziness remaining with bed mobility.    Status Achieved   08/24/20                Plan - 08/27/20 0931    Clinical Impression Statement [name] she misplaced the HEP handout from last visit but has been trying to perform activities from memory. Handout reprinted and activity reviewed with pt able to complete full progression of balance activities through semi-tandem stance but struggling with static balance in tandem and  SLS (better with opp LE TTWB), therefore encouraged pt to just work on static control in these positions and defer balance progression until able to maintain good control for ~30 sec. Introduced visual scanning of environment while searching for cones placed t/o gym - pt able to retrieve all cones w/o LOB while demonstrating improved body mechanics with squatting for cone retrieval from floor so as not to trigger vertigo or exacerbate LBP. Encouraged increased stride length with retro gait to improve stability with posterior weight shift with pt noting increased confidence with walking backward when utilizing longer stride.    Personal Factors and Comorbidities Comorbidity 3+;Age;Fitness;Past/Current Experience    Comorbidities Recurrent falls - 2 in past 6 months, 6 major falls in past 5-6 yrs (2 concussions, finger fracture); costochondritis, cellulitis of R breast, fibromyalgia, chronic LBP since ~age 57, DDD, sciatica, osteoporosis, degenerative scoliosis, peripheral neuropathy, vertigo, R knee pain, CAD, COPD, HTN, h/o lumpectomy for R breast cancer 2013, ORIF R ankle s/p fall 2006    Examination-Activity Limitations Bathing;Bend;Carry;Dressing;Lift;Locomotion Level;Stand    Examination-Participation Restrictions Church;Community Activity;Interpersonal Relationship;Laundry;Meal Prep;Shop;Yard Work    Rehab Potential Good    PT Frequency 2x / week    PT Duration 6 weeks  PT Treatment/Interventions ADLs/Self Care Home Management;Cryotherapy;Electrical Stimulation;Iontophoresis 43m/ml Dexamethasone;Moist Heat;Ultrasound;DME Instruction;Gait training;Stair training;Functional mobility training;Therapeutic activities;Therapeutic exercise;Balance training;Neuromuscular re-education;Patient/family education;Manual techniques;Passive range of motion;Dry needling;Taping;Vasopneumatic Device;Vestibular;Visual/perceptual remediation/compensation;Spinal Manipulations    PT Next Visit Plan lumbopelvic  strengthening and stabilization - HEP review/update PRN; balance training; manual therapy & modalities for pain including ionto patch and/or taping as benefit noted for B hip/greater trochanter pain; balance training; vestibular reassessment & treatment as indicated    PT Home Exercise Plan 6/24 - supported squat/eccentric stand>sit with yellow TB hip ABD isometric, seated yellow TB clam & marching; 7/23 - piriformis, ITB, hip flexor & HS stretches; 7/28 - yellow TB hook lying clam shell    Consulted and Agree with Plan of Care Patient           Patient will benefit from skilled therapeutic intervention in order to improve the following deficits and impairments:  Abnormal gait, Decreased activity tolerance, Decreased balance, Decreased coordination, Decreased endurance, Decreased knowledge of precautions, Decreased mobility, Decreased safety awareness, Decreased strength, Difficulty walking, Dizziness, Increased fascial restricitons, Increased muscle spasms, Impaired perceived functional ability, Impaired flexibility, Improper body mechanics, Postural dysfunction, Pain  Visit Diagnosis: Repeated falls  Unsteadiness on feet  Muscle weakness (generalized)  Other abnormalities of gait and mobility  Dizziness and giddiness  Chronic bilateral low back pain without sciatica     Problem List Patient Active Problem List   Diagnosis Date Noted  . OSA and COPD overlap syndrome ([place] 06/10/2020  . Serotonin withdrawal syndrome without complication 065/99/3570 . Recurrent falls 05/02/2020  . Statin intolerance 02/29/2020  . RLS (restless legs syndrome) 11/09/2019  . Kidney stone 02/26/2019  . Recurrent sinusitis 02/25/2019  . Hx of colonic polyp 02/25/2019  . DDD (degenerative disc disease), cervical 09/17/2018  . Degenerative scoliosis 04/22/2018  . Primary insomnia 06/25/2017  . Chronic interstitial cystitis 02/01/2017  . Cough 01/09/2017  . Right arm pain 07/20/2016  . Deviated  septum 05/12/2016  . Nasal turbinate hypertrophy 05/12/2016  . Dysphagia   . Allergic rhinitis 01/25/2016  . Other and combined forms of senile cataract 07/15/2015  . Dyspnea   . Left hip pain 04/30/2015  . Sciatica 04/30/2015  . Knee pain, right 04/30/2015  . Bilateral carpal tunnel syndrome 03/04/2015  . Hyperlipidemia 03/01/2015  . Pulmonary nodules 02/12/2015  . External hemorrhoids 02/05/2015  . Chronic night sweats 01/08/2015  . Atypical chest pain 01/01/2015  . Renal insufficiency 07/16/2014  . Memory loss 05/22/2014  . Peripheral neuropathy 05/22/2014  . Abdominal pain 10/26/2013  . Headache 10/13/2013  . Arthralgia 08/18/2013  . ANA positive 07/29/2013  . Depression 02/05/2013  . Family history of malignant hyperthermia 02/05/2013  . Malignant neoplasm of lower-outer quadrant of right breast of female, estrogen receptor positive ([place] 01/03/2013  . Splenic lesion 09/02/2012  . Asthma, allergic 08/05/2012  . GERD (gastroesophageal reflux disease) 06/03/2012  . Edema 04/08/2012  . Stricture and stenosis of esophagus 10/19/2011  . Functional constipation 07/21/2011  . Nonspecific (abnormal) findings on radiological and other examination of biliary tract 07/21/2011  . Osteoporosis 04/17/2011  . Leg pain 02/24/2011  . FIBROCYSTIC BREAST DISEASE, HX OF 10/18/2010  . Essential hypertension 08/25/2010  . CAD in native artery 08/25/2010  . TOBACCO ABUSE, HX OF 08/25/2010  . GENERALIZED ANXIETY DISORDER 08/03/2010  . Fibromyalgia 01/11/2010    JPercival Spanish9/02/2020, 9:58 AM  [facility] [place] [address]Phone: [phone]  Fax:  [phone]  Name: [name] MRN: [id number] Date of Birth: Oct 11, 1946

## 2020-08-31 ENCOUNTER — Ambulatory Visit: Payer: Medicare Other | Admitting: Physical Therapy

## 2020-08-31 ENCOUNTER — Other Ambulatory Visit: Payer: Self-pay

## 2020-08-31 VITALS — BP 114/62 | HR 76

## 2020-08-31 NOTE — Therapy (Addendum)
[facility] [address]  [place], [place], [address] Phone: [phone]   Fax:  [phone]  Patient Details  Name: [name] MRN: [id number] Date of Birth: Mar 22, 1946 Referring Provider:  Mosie Lukes, MD  Encounter Date: 08/31/2020  Patient arriving to PT stating she feels very weak today after episode of severe diarrhea over the weekend - symptoms resolved as of yesterday morning but still not feeling back to normal. Upon attempting to rise from seat in waiting room she staggered multiple times and notes mild dizziness (not like her vertigo dizziness).  BP 114/62 (BP Location: Left Arm, Patient Position: Sitting, Cuff Size: Normal)   Pulse 76   SpO2 95%   Patient reports BP ~20 points lower than normal for her. PT treatment held today and pt accompanied out of the building.   Percival Spanish, PT, MPT 08/31/2020, 9:27 AM  [facility] [address]  [place], [place], [address] Phone: [phone]   Fax:  [phone]

## 2020-09-02 DIAGNOSIS — R52 Pain, unspecified: Secondary | ICD-10-CM | POA: Diagnosis not present

## 2020-09-02 DIAGNOSIS — G4489 Other headache syndrome: Secondary | ICD-10-CM | POA: Diagnosis not present

## 2020-09-02 DIAGNOSIS — Z888 Allergy status to other drugs, medicaments and biological substances status: Secondary | ICD-10-CM | POA: Diagnosis not present

## 2020-09-02 DIAGNOSIS — Z885 Allergy status to narcotic agent status: Secondary | ICD-10-CM | POA: Diagnosis not present

## 2020-09-02 DIAGNOSIS — Z87891 Personal history of nicotine dependence: Secondary | ICD-10-CM | POA: Diagnosis not present

## 2020-09-02 DIAGNOSIS — I10 Essential (primary) hypertension: Secondary | ICD-10-CM | POA: Diagnosis not present

## 2020-09-02 DIAGNOSIS — Z880 Allergy status to penicillin: Secondary | ICD-10-CM | POA: Diagnosis not present

## 2020-09-02 DIAGNOSIS — G8911 Acute pain due to trauma: Secondary | ICD-10-CM | POA: Diagnosis not present

## 2020-09-02 DIAGNOSIS — I959 Hypotension, unspecified: Secondary | ICD-10-CM | POA: Diagnosis not present

## 2020-09-02 DIAGNOSIS — Z91040 Latex allergy status: Secondary | ICD-10-CM | POA: Diagnosis not present

## 2020-09-02 DIAGNOSIS — Z887 Allergy status to serum and vaccine status: Secondary | ICD-10-CM | POA: Diagnosis not present

## 2020-09-02 DIAGNOSIS — E785 Hyperlipidemia, unspecified: Secondary | ICD-10-CM | POA: Diagnosis not present

## 2020-09-02 DIAGNOSIS — Z79899 Other long term (current) drug therapy: Secondary | ICD-10-CM | POA: Diagnosis not present

## 2020-09-02 DIAGNOSIS — S0003XA Contusion of scalp, initial encounter: Secondary | ICD-10-CM | POA: Diagnosis not present

## 2020-09-02 DIAGNOSIS — G894 Chronic pain syndrome: Secondary | ICD-10-CM | POA: Diagnosis not present

## 2020-09-02 DIAGNOSIS — Z7951 Long term (current) use of inhaled steroids: Secondary | ICD-10-CM | POA: Diagnosis not present

## 2020-09-02 DIAGNOSIS — J45909 Unspecified asthma, uncomplicated: Secondary | ICD-10-CM | POA: Diagnosis not present

## 2020-09-02 DIAGNOSIS — Z881 Allergy status to other antibiotic agents status: Secondary | ICD-10-CM | POA: Diagnosis not present

## 2020-09-02 DIAGNOSIS — M797 Fibromyalgia: Secondary | ICD-10-CM | POA: Diagnosis not present

## 2020-09-02 DIAGNOSIS — S161XXA Strain of muscle, fascia and tendon at neck level, initial encounter: Secondary | ICD-10-CM | POA: Diagnosis not present

## 2020-09-02 DIAGNOSIS — M1990 Unspecified osteoarthritis, unspecified site: Secondary | ICD-10-CM | POA: Diagnosis not present

## 2020-09-02 DIAGNOSIS — S0990XA Unspecified injury of head, initial encounter: Secondary | ICD-10-CM | POA: Diagnosis not present

## 2020-09-02 DIAGNOSIS — R519 Headache, unspecified: Secondary | ICD-10-CM | POA: Diagnosis not present

## 2020-09-03 ENCOUNTER — Ambulatory Visit: Payer: Medicare Other

## 2020-09-07 ENCOUNTER — Other Ambulatory Visit: Payer: Self-pay

## 2020-09-07 ENCOUNTER — Ambulatory Visit (INDEPENDENT_AMBULATORY_CARE_PROVIDER_SITE_OTHER): Payer: Medicare Other | Admitting: Family Medicine

## 2020-09-07 ENCOUNTER — Encounter: Payer: Self-pay | Admitting: *Deleted

## 2020-09-07 ENCOUNTER — Ambulatory Visit: Payer: Medicare Other | Admitting: Physical Therapy

## 2020-09-07 DIAGNOSIS — R296 Repeated falls: Secondary | ICD-10-CM

## 2020-09-07 DIAGNOSIS — G4733 Obstructive sleep apnea (adult) (pediatric): Secondary | ICD-10-CM | POA: Diagnosis not present

## 2020-09-07 DIAGNOSIS — I10 Essential (primary) hypertension: Secondary | ICD-10-CM | POA: Diagnosis not present

## 2020-09-07 DIAGNOSIS — M810 Age-related osteoporosis without current pathological fracture: Secondary | ICD-10-CM

## 2020-09-07 DIAGNOSIS — M545 Low back pain, unspecified: Secondary | ICD-10-CM

## 2020-09-07 DIAGNOSIS — G2581 Restless legs syndrome: Secondary | ICD-10-CM

## 2020-09-07 DIAGNOSIS — N289 Disorder of kidney and ureter, unspecified: Secondary | ICD-10-CM

## 2020-09-07 DIAGNOSIS — J449 Chronic obstructive pulmonary disease, unspecified: Secondary | ICD-10-CM

## 2020-09-07 DIAGNOSIS — E785 Hyperlipidemia, unspecified: Secondary | ICD-10-CM | POA: Diagnosis not present

## 2020-09-07 DIAGNOSIS — F5101 Primary insomnia: Secondary | ICD-10-CM

## 2020-09-07 DIAGNOSIS — K5904 Chronic idiopathic constipation: Secondary | ICD-10-CM

## 2020-09-07 DIAGNOSIS — G6289 Other specified polyneuropathies: Secondary | ICD-10-CM

## 2020-09-07 MED ORDER — PRAMIPEXOLE DIHYDROCHLORIDE 0.125 MG PO TABS: 0.1250 mg | ORAL_TABLET | Freq: Two times a day (BID) | ORAL | 5 refills | Status: DC

## 2020-09-07 MED ORDER — TIZANIDINE HCL 2 MG PO TABS: 1.0000 mg | ORAL_TABLET | Freq: Three times a day (TID) | ORAL | 1 refills | Status: DC | PRN

## 2020-09-07 NOTE — Patient Instructions (Signed)
Restless Legs Syndrome Restless legs syndrome is a condition that causes uncomfortable feelings or sensations in the legs, especially while sitting or lying down. The sensations usually cause an overwhelming urge to move the legs. The arms can also sometimes be affected. The condition can range from mild to severe. The symptoms often interfere with a person's ability to sleep. What are the causes? The cause of this condition is not known. What increases the risk? The following factors may make you more likely to develop this condition:  Being older than 50.  Pregnancy.  Being a woman. In general, the condition is more common in women than in men.  A family history of the condition.  Having iron deficiency.  Overuse of caffeine, nicotine, or alcohol.  Certain medical conditions, such as kidney disease, Parkinson's disease, or nerve damage.  Certain medicines, such as those for high blood pressure, nausea, colds, allergies, depression, and some heart conditions. What are the signs or symptoms? The main symptom of this condition is uncomfortable sensations in the legs, such as:  Pulling.  Tingling.  Prickling.  Throbbing.  Crawling.  Burning. Usually, the sensations:  Affect both sides of the body.  Are worse when you sit or lie down.  Are worse at night. These may wake you up or make it difficult to fall asleep.  Make you have a strong urge to move your legs.  Are temporarily relieved by moving your legs. The arms can also be affected, but this is rare. People who have this condition often have tiredness during the day because of their lack of sleep at night. How is this diagnosed? This condition may be diagnosed based on:  Your symptoms.  Blood tests. In some cases, you may be monitored in a sleep lab by a specialist (a sleep study). This can detect any disruptions in your sleep. How is this treated? This condition is treated by managing the symptoms. This may  include:  Lifestyle changes, such as exercising, using relaxation techniques, and avoiding caffeine, alcohol, or tobacco.  Medicines. Anti-seizure medicines may be tried first. Follow these instructions at home:     General instructions  Take over-the-counter and prescription medicines only as told by your health care provider.  Use methods to help relieve the uncomfortable sensations, such as: ? Massaging your legs. ? Walking or stretching. ? Taking a cold or hot bath.  Keep all follow-up visits as told by your health care provider. This is important. Lifestyle  Practice good sleep habits. For example, go to bed and get up at the same time every day. Most adults should get 7-9 hours of sleep each night.  Exercise regularly. Try to get at least 30 minutes of exercise most days of the week.  Practice ways of relaxing, such as yoga or meditation.  Avoid caffeine and alcohol.  Do not use any products that contain nicotine or tobacco, such as cigarettes and e-cigarettes. If you need help quitting, ask your health care provider. Contact a health care provider if:  Your symptoms get worse or they do not improve with treatment. Summary  Restless legs syndrome is a condition that causes uncomfortable feelings or sensations in the legs, especially while sitting or lying down.  The symptoms often interfere with a person's ability to sleep.  This condition is treated by managing the symptoms. You may need to make lifestyle changes or take medicines. This information is not intended to replace advice given to you by your health care provider. Make sure   you discuss any questions you have with your health care provider. Document Revised: 12/31/2017 Document Reviewed: 12/31/2017 Elsevier Patient Education  2020 [employer]  

## 2020-09-07 NOTE — Assessment & Plan Note (Signed)
Continues with PT and

## 2020-09-08 DIAGNOSIS — M545 Low back pain, unspecified: Secondary | ICD-10-CM | POA: Insufficient documentation

## 2020-09-08 HISTORY — DX: Low back pain, unspecified: M54.50

## 2020-09-08 NOTE — Assessment & Plan Note (Signed)
Has hadsome recent diarrhea but has improved. She is going to keep her appt with gastroenterology later this month

## 2020-09-08 NOTE — Assessment & Plan Note (Signed)
mirapex has been much more effective than requip

## 2020-09-08 NOTE — Progress Notes (Signed)
Subjective:    Patient ID: [name], female    DOB: 1946-10-09, 74 y.o.   MRN: [id number]  Chief Complaint  Patient presents with  . Hospitalization Follow-up    4 months-room 22    HPI Patient is in today for follow up on chronic medical concerns. She has suffered a recent fall in which she hit her head and hurt her low back . No loss of consciousness or neurologic concerns. Her low back pain persists but no incontinence. No other recent trauma. Her RLS is better with Amitriptyline. Her peripheral neuropathy continues to bother her. Denies CP/palp/SOB/HA/congestion/fevers/GI or GU c/o. Taking meds as prescribed  Past Medical History:  Diagnosis Date  . Allergy   . Anemia   . Anxiety    past hx   . Arthritis   . Asthma   . Atrial flutter ([place])    past history- not current  . CAD (coronary artery disease) CARDIOLOGIST--  DR Angelena Form   mild non-obstructive cad  . Cancer ([place])    right  . Cataract    bilaterally removed   . Chronic constipation   . Chronic kidney disease    interstitial cystitis  . COPD (chronic obstructive pulmonary disease) ([place])   . Depression    past hx   . Family history of malignant hyperthermia    father had this  . Fibromyalgia   . Frequency of urination   . GERD (gastroesophageal reflux disease)   . H/O hiatal hernia   . History of basal cell carcinoma excision    X2  . History of breast cancer ONCOLOGIST-- DR Jana Hakim---  NO RECURRANCE   DX 07/2012;  LOW GRADE DCIS  ER+PR+  ----  S/P RIGHT LUMPECTOMY WITH NEGATIVE MARGINS/   RADIATION ENDED 11/2012  . History of chronic bronchitis   . History of colonic polyps   . History of tachycardia    CONTROLLED  WITH ATENOLOL  . Hyperlipidemia   . Hypertension   . Neuromuscular disorder (HCC)    fibromyalgia  . Osteoporosis 01/2019   T score -2.2 stable/improved from prior study  . Pelvic pain   . Personal history of radiation therapy   . S/P radiation therapy 11/12/12 - 12/05/12   right  Breast  . Sepsis ([place]) 2014   from UTI   . Sinus headache   . Urgency of urination     Past Surgical History:  Procedure Laterality Date  . [place] STUDY N/A 04/03/2016   Procedure: [place] STUDY;  Surgeon: Manus Gunning, MD;  Location: [facility];  Service: Gastroenterology;  Laterality: N/A;  . BREAST LUMPECTOMY Right 10-11-2012   W/ SLN BX  . CARDIAC CATHETERIZATION  09-13-2007  DR Lia Foyer   WELL-PRESERVED LVF/  DIFFUSE SCATTERED CORONARY CALCIFACATION AND ATHEROSCLEROSIS WITHOUT OBSTRUCTION  . CARDIAC CATHETERIZATION  08-04-2010  DR Angelena Form   NON-OBSTRUCTIVE CAD/  pLAD 40%/  oLAD 30%/  mLAD 30%/  pRCA 30%/  EF 60%  . CARDIOVASCULAR STRESS TEST  06-18-2012  DR McALHANY   LOW RISK NUCLEAR STUDY/  SMALL FIXED AREA OF MODERATELY DECREASED UPTAKE IN ANTEROSEPTAL WALL WHICH MAY BE ARTIFACTUAL/  NO ISCHEMIA/  EF 68%  . COLONOSCOPY  09-29-2010  . CYSTOSCOPY    . CYSTOSCOPY WITH HYDRODISTENSION AND BIOPSY N/A 03/06/2014   Procedure: CYSTOSCOPY/HYDRODISTENSION/ INSTILATION OF MARCAINE AND PYRIDIUM;  Surgeon: Ailene Rud, MD;  Location: [facility];  Service: Urology;  Laterality: N/A;  . ESOPHAGEAL MANOMETRY N/A 04/03/2016  Procedure: ESOPHAGEAL MANOMETRY (EM);  Surgeon: Ruffin Frederick, MD;  Location: [facility];  Service: Gastroenterology;  Laterality: N/A;  . EXTRACORPOREAL SHOCK WAVE LITHOTRIPSY Left 02/06/2019   Procedure: EXTRACORPOREAL SHOCK WAVE LITHOTRIPSY (ESWL);  Surgeon: Ihor Gully, MD;  Location: [facility];  Service: Urology;  Laterality: Left;  . NASAL SINUS SURGERY  1985  . ORIF RIGHT ANKLE  FX  2006  . POLYPECTOMY    . REMOVAL VOCAL CORD CYST  FEB 2014  . RIGHT BREAST BX  08-23-2012  . RIGHT HAND SURGERY  X3  LAST ONE 2009   INCLUDES  ORIF RIGHT 5TH FINGER AND REVISION TWICE  . SKIN CANCER EXCISION    . TONSILLECTOMY AND ADENOIDECTOMY  AGE 52  . TOTAL ABDOMINAL HYSTERECTOMY W/ BILATERAL SALPINGOOPHORECTOMY  1982   W/   APPENDECTOMY  . TRANSTHORACIC ECHOCARDIOGRAM  06-24-2012   GRADE I DIASTOLIC DYSFUNCTION/  EF 55-60%/  MILD MR  . UPPER GASTROINTESTINAL ENDOSCOPY      Family History  Problem Relation Age of Onset  . Rectal cancer Mother   . Colon cancer Mother   . Pancreatic cancer Mother   . Diabetes Mother   . Breast cancer Mother 46  . Breast cancer Maternal Aunt        breast  . Irritable bowel syndrome Son   . Heart disease Son        CAD, MV replacement  . Allergic Disorder Daughter   . Colon cancer Father   . Colon polyps Father   . Diabetes Father   . Stroke Father   . Heart disease Father        CHF  . Hyperlipidemia Father   . Hypertension Father   . Arthritis Father   . Breast cancer Paternal Aunt   . Breast cancer Paternal Aunt   . Arthritis Paternal Uncle   . Allergic Disorder Daughter   . Esophageal cancer Neg Hx   . Stomach cancer Neg Hx     Social History   Socioeconomic History  . Marital status: Married    Spouse name: Windy Fast   . Number of children: 3  . Years of education: BA  . Highest education level: Not on file  Occupational History  . Occupation: Retired Teacher, adult education: RETIRED  Tobacco Use  . Smoking status: Former Smoker    Packs/day: 1.00    Years: 15.00    Pack years: 15.00    Types: Cigarettes    Quit date: 05/27/2005    Years since quitting: 15.2  . Smokeless tobacco: Never Used  Vaping Use  . Vaping Use: Never used  Substance and Sexual Activity  . Alcohol use: No    Alcohol/week: 0.0 standard drinks  . Drug use: No  . Sexual activity: Yes    Partners: Male    Comment: 1st intercourse 74 yo-Fewer than 5 partners  Other Topics Concern  . Not on file  Social History Narrative   Lives with husband.   Caffeine use: 1/2 cup per day   Exercise-- 2days a week [employer],  Water aerobics, walking       Retired Engineer, civil (consulting)   No dietary restrictions, tries to maintain a heart healthy diet   Social Determinants of Corporate investment banker Strain:  Low Risk   . Difficulty of Paying Living Expenses: Not hard at all  Food Insecurity: No Food Insecurity  . Worried About Programme researcher, broadcasting/film/video in the Last Year: Never true  . Ran Out  of Food in the Last Year: Never true  Transportation Needs: No Transportation Needs  . Lack of Transportation (Medical): No  . Lack of Transportation (Non-Medical): No  Physical Activity:   . Days of Exercise per Week: Not on file  . Minutes of Exercise per Session: Not on file  Stress:   . Feeling of Stress : Not on file  Social Connections:   . Frequency of Communication with Friends and Family: Not on file  . Frequency of Social Gatherings with Friends and Family: Not on file  . Attends Religious Services: Not on file  . Active Member of Clubs or Organizations: Not on file  . Attends Banker Meetings: Not on file  . Marital Status: Not on file  Intimate Partner Violence:   . Fear of Current or Ex-Partner: Not on file  . Emotionally Abused: Not on file  . Physically Abused: Not on file  . Sexually Abused: Not on file    Outpatient Medications Prior to Visit  Medication Sig Dispense Refill  . acetaminophen (TYLENOL) 500 MG tablet Take 1,000 mg by mouth every 6 (six) hours as needed for mild pain or headache.     . albuterol (PROVENTIL HFA;VENTOLIN HFA) 108 (90 BASE) MCG/ACT inhaler Inhale 2 puffs into the lungs every 4 (four) hours as needed. 1 Inhaler 5  . amLODipine (NORVASC) 10 MG tablet Take 1 tablet (10 mg total) by mouth daily. 90 tablet 3  . aspirin EC 81 MG tablet Take 1 tablet (81 mg total) by mouth daily. 90 tablet 3  . atenolol (TENORMIN) 50 MG tablet Take 1 tablet (50 mg total) by mouth 2 (two) times daily. 180 tablet 1  . benzonatate (TESSALON) 100 MG capsule Take 1 capsule (100 mg total) by mouth 2 (two) times daily as needed for cough. 20 capsule 0  . denosumab (PROLIA) 60 MG/ML SOSY injection Inject 60 mg into the skin every 6 (six) months.    . dicyclomine (BENTYL) 10 MG  capsule Take 1 capsule (10 mg total) by mouth every 8 (eight) hours. 90 capsule 1  . DULoxetine (CYMBALTA) 60 MG capsule Take 2 capsules (120 mg total) by mouth daily. 180 capsule 0  . Estradiol 10 MCG TABS vaginal tablet Place 1 tablet (10 mcg total) vaginally 2 (two) times a week. 24 tablet 4  . fluticasone (FLONASE) 50 MCG/ACT nasal spray Use 2 spray(s) in each nostril once daily 16 g 0  . furosemide (LASIX) 20 MG tablet Take 1 tablet (20 mg total) by mouth as needed for edema. 90 tablet 3  . gabapentin (NEURONTIN) 300 MG capsule Take 300 mg by mouth. 1cap in the morning, 1cap at noon, and 2caps at bedtime    . hyoscyamine (LEVSIN SL) 0.125 MG SL tablet Place 1 tablet (0.125 mg total) under the tongue every 4 (four) hours as needed. 30 tablet 0  . loratadine (CLARITIN) 10 MG tablet Take 1 tablet (10 mg total) by mouth daily. 30 tablet 11  . losartan (COZAAR) 100 MG tablet Take 1 tablet by mouth once daily 90 tablet 2  . mupirocin ointment (BACTROBAN) 2 % Apply thin film twice daily. 22 g 0  . nitroGLYCERIN (NITROSTAT) 0.4 MG SL tablet Place 1 tablet (0.4 mg total) under the tongue every 5 (five) minutes as needed for chest pain. 25 tablet 1  . omeprazole (PRILOSEC) 40 MG capsule Take 1 capsule by mouth twice daily 90 capsule 0  . pentosan polysulfate (ELMIRON) 100 MG capsule Take  1 capsule (100 mg total) by mouth 3 (three) times daily. Reported on 01/05/2016 (Patient taking differently: Take 100 mg by mouth 3 (three) times daily with meals as needed. Reported on 01/05/2016) 90 capsule 11  . REPATHA SURECLICK 140 MG/ML SOAJ INJECT 140 MG SUBCUTANEOUSLY EVERY 14 DAYS 2 pen 11  . traZODone (DESYREL) 100 MG tablet TAKE 1 TABLET BY MOUTH AT BEDTIME 90 tablet 0  . pramipexole (MIRAPEX) 0.125 MG tablet Take 1-2 tablets (0.125-0.25 mg total) by mouth 3 (three) times daily. 60 tablet 1  . rOPINIRole (REQUIP) 0.25 MG tablet Take 1 tablet (0.25 mg total) by mouth 3 (three) times daily. 30 tablet 5    Facility-Administered Medications Prior to Visit  Medication Dose Route Frequency Provider Last Rate Last Admin  . bupivacaine (MARCAINE) 0.5 % 10 mL, triamcinolone acetonide (KENALOG-40) 40 mg injection   Subcutaneous Once Jethro Bolus, MD      . bupivacaine (MARCAINE) 0.5 % 15 mL, phenazopyridine (PYRIDIUM) 400 mg bladder mixture   Bladder Instillation Once Jethro Bolus, MD        Allergies  Allergen Reactions  . Clindamycin Hcl Shortness Of Breath and Rash  . Penicillins Anaphylaxis  . Prednisone Shortness Of Breath and Rash  . Rosuvastatin Anaphylaxis  . Baclofen Other (See Comments) and Rash  . Cortisone Other (See Comments)  . Lincomycin Other (See Comments)  . Codeine Hives and Other (See Comments)    headache  . Erythromycin Base Other (See Comments)    other  . Fluzone [Flu Virus Vaccine] Other (See Comments)    Local reaction at the site  . Haemophilus Influenzae Other (See Comments)    Local reaction at the site Local reaction at the site  . Latex Hives  . Pentazocine Lactate Other (See Comments)    HALLUCINATION  . Pneumococcal Vaccine Polyvalent Hives, Swelling and Other (See Comments)    REACTION: redness, swelling, and hives at injection site  . Tamoxifen Nausea And Vomiting and Other (See Comments)    HEADACHE    ROS     Objective:    Physical Exam Vitals and nursing note reviewed.  Constitutional:      General: She is not in acute distress.    Appearance: She is well-developed.  HENT:     Head: Normocephalic and atraumatic.     Nose: Nose normal.  Eyes:     General:        Right eye: No discharge.        Left eye: No discharge.  Cardiovascular:     Rate and Rhythm: Normal rate and regular rhythm.     Heart sounds: No murmur heard.   Pulmonary:     Effort: Pulmonary effort is normal.     Breath sounds: Normal breath sounds.  Abdominal:     General: Bowel sounds are normal.     Palpations: Abdomen is soft.     Tenderness:  There is no abdominal tenderness.  Musculoskeletal:     Cervical back: Normal range of motion and neck supple.  Skin:    General: Skin is warm and dry.  Neurological:     Mental Status: She is alert and oriented to person, place, and time.     BP (!) 124/59 (BP Location: Left Arm, Patient Position: Sitting, Cuff Size: Small)   Pulse 70   Temp (!) 97.4 F (36.3 C) (Oral)   Resp 12   Ht 5\' 5"  (1.651 m)   Wt 137 lb 3.2 oz (62.2 kg)  SpO2 96%   BMI 22.83 kg/m  Wt Readings from Last 3 Encounters:  09/07/20 137 lb 3.2 oz (62.2 kg)  07/28/20 135 lb 4 oz (61.3 kg)  06/10/20 135 lb 9.6 oz (61.5 kg)    Diabetic Foot Exam - Simple   No data filed     Lab Results  Component Value Date   WBC 7.2 07/28/2020   HGB 12.3 07/28/2020   HCT 38.3 07/28/2020   PLT 292.0 07/28/2020   GLUCOSE 93 07/28/2020   CHOL 129 02/26/2020   TRIG 124.0 02/26/2020   HDL 44.40 02/26/2020   LDLDIRECT 117.3 04/08/2012   LDLCALC 60 02/26/2020   ALT 8 05/13/2020   AST 16 05/13/2020   NA 139 07/28/2020   K 4.7 07/28/2020   CL 104 07/28/2020   CREATININE 1.02 07/28/2020   BUN 15 07/28/2020   CO2 30 07/28/2020   TSH 1.76 02/26/2020   INR 1.05 08/03/2010   HGBA1C 5.8 03/01/2015   MICROALBUR 0.2 07/10/2013    Lab Results  Component Value Date   TSH 1.76 02/26/2020   Lab Results  Component Value Date   WBC 7.2 07/28/2020   HGB 12.3 07/28/2020   HCT 38.3 07/28/2020   MCV 94.0 07/28/2020   PLT 292.0 07/28/2020   Lab Results  Component Value Date   NA 139 07/28/2020   K 4.7 07/28/2020   CHLORIDE 102 04/20/2015   CO2 30 07/28/2020   GLUCOSE 93 07/28/2020   BUN 15 07/28/2020   CREATININE 1.02 07/28/2020   BILITOT 0.5 05/13/2020   ALKPHOS 80 05/13/2020   AST 16 05/13/2020   ALT 8 05/13/2020   PROT 6.7 05/13/2020   ALBUMIN 4.0 05/13/2020   CALCIUM 9.0 07/28/2020   ANIONGAP 12 (H) 04/20/2015   EGFR 49 (L) 04/20/2015   GFR 52.82 (L) 07/28/2020   Lab Results  Component Value Date    CHOL 129 02/26/2020   Lab Results  Component Value Date   HDL 44.40 02/26/2020   Lab Results  Component Value Date   LDLCALC 60 02/26/2020   Lab Results  Component Value Date   TRIG 124.0 02/26/2020   Lab Results  Component Value Date   CHOLHDL 3 02/26/2020   Lab Results  Component Value Date   HGBA1C 5.8 03/01/2015       Assessment & Plan:   Problem List Items Addressed This Visit    Essential hypertension    Well controlled, no changes to meds. Encouraged heart healthy diet such as the DASH diet and exercise as tolerated.       Osteoporosis    Encouraged to get adequate exercise, calcium and vitamin d intake      Functional constipation    Has hadsome recent diarrhea but has improved. She is going to keep her appt with gastroenterology later this month      Renal insufficiency    Hydrate and monitor      Hyperlipidemia    Encouraged heart healthy diet, increase exercise, avoid trans fats, consider a krill oil cap daily      Peripheral neuropathy    Gabapentin has helped partially, no concerning side effects will try and increase dose to see if she gets further relief.       Relevant Medications   tiZANidine (ZANAFLEX) 2 MG tablet   pramipexole (MIRAPEX) 0.125 MG tablet   Primary insomnia    Encouraged good sleep hygiene such as dark, quiet room. No blue/green glowing lights such as computer screens in  bedroom. No alcohol or stimulants in evening. Cut down on caffeine as able. Regular exercise is helpful but not just prior to bed time. Can continue Trazodone      RLS (restless legs syndrome)    mirapex has been much more effective than requip      Recurrent falls    Continues with PT and       OSA and COPD overlap syndrome (HCC)   Low back pain    Worse with recent fall. Encouraged moist heat and gentle stretching as tolerated. May try NSAIDs and prescription meds as directed and report if symptoms worsen or seek immediate care. Prescribed  Tizanidine to take 1-4 mg as needed for  pain      Relevant Medications   tiZANidine (ZANAFLEX) 2 MG tablet      I have discontinued Christean Grief. Hamor's rOPINIRole. I have also changed her pramipexole. Additionally, I am having her start on tiZANidine. Lastly, I am having her maintain her acetaminophen, aspirin EC, albuterol, loratadine, pentosan polysulfate, denosumab, mupirocin ointment, nitroGLYCERIN, furosemide, Estradiol, losartan, hyoscyamine, Repatha SureClick, gabapentin, atenolol, amLODipine, benzonatate, traZODone, omeprazole, fluticasone, dicyclomine, and DULoxetine.  Meds ordered this encounter  Medications  . tiZANidine (ZANAFLEX) 2 MG tablet    Sig: Take 0.5-2 tablets (1-4 mg total) by mouth every 8 (eight) hours as needed for muscle spasms.    Dispense:  40 tablet    Refill:  1  . pramipexole (MIRAPEX) 0.125 MG tablet    Sig: Take 1-2 tablets (0.125-0.25 mg total) by mouth in the morning and at bedtime.    Dispense:  60 tablet    Refill:  5     [name], MD

## 2020-09-08 NOTE — Assessment & Plan Note (Signed)
Worse with recent fall. Encouraged moist heat and gentle stretching as tolerated. May try NSAIDs and prescription meds as directed and report if symptoms worsen or seek immediate care. Prescribed Tizanidine to take 1-4 mg as needed for  pain

## 2020-09-08 NOTE — Assessment & Plan Note (Signed)
Encouraged heart healthy diet, increase exercise, avoid trans fats, consider a krill oil cap daily 

## 2020-09-08 NOTE — Assessment & Plan Note (Signed)
Hydrate and monitor 

## 2020-09-08 NOTE — Assessment & Plan Note (Signed)
Well controlled, no changes to meds. Encouraged heart healthy diet such as the DASH diet and exercise as tolerated.  °

## 2020-09-08 NOTE — Assessment & Plan Note (Signed)
Encouraged good sleep hygiene such as dark, quiet room. No blue/green glowing lights such as computer screens in bedroom. No alcohol or stimulants in evening. Cut down on caffeine as able. Regular exercise is helpful but not just prior to bed time. Can continue Trazodone

## 2020-09-08 NOTE — Assessment & Plan Note (Signed)
Encouraged to get adequate exercise, calcium and vitamin d intake 

## 2020-09-08 NOTE — Assessment & Plan Note (Signed)
Gabapentin has helped partially, no concerning side effects will try and increase dose to see if she gets further relief.

## 2020-09-10 ENCOUNTER — Ambulatory Visit: Payer: Medicare Other | Admitting: Physical Therapy

## 2020-09-10 ENCOUNTER — Other Ambulatory Visit: Payer: Self-pay

## 2020-09-10 ENCOUNTER — Encounter: Payer: Self-pay | Admitting: Physical Therapy

## 2020-09-10 VITALS — BP 134/82 | HR 67

## 2020-09-10 DIAGNOSIS — M6281 Muscle weakness (generalized): Secondary | ICD-10-CM | POA: Diagnosis not present

## 2020-09-10 DIAGNOSIS — R2681 Unsteadiness on feet: Secondary | ICD-10-CM | POA: Diagnosis not present

## 2020-09-10 DIAGNOSIS — G44311 Acute post-traumatic headache, intractable: Secondary | ICD-10-CM | POA: Diagnosis not present

## 2020-09-10 DIAGNOSIS — R42 Dizziness and giddiness: Secondary | ICD-10-CM | POA: Diagnosis not present

## 2020-09-10 DIAGNOSIS — G8929 Other chronic pain: Secondary | ICD-10-CM

## 2020-09-10 DIAGNOSIS — R296 Repeated falls: Secondary | ICD-10-CM

## 2020-09-10 DIAGNOSIS — M545 Low back pain, unspecified: Secondary | ICD-10-CM

## 2020-09-10 DIAGNOSIS — R2689 Other abnormalities of gait and mobility: Secondary | ICD-10-CM | POA: Diagnosis not present

## 2020-09-10 NOTE — Therapy (Addendum)
[facility] [address]  [place], [place], [address] Phone: [phone]   Fax:  [phone]  Physical Therapy Treatment  Patient Details  Name: [name] MRN: [id number] Date of Birth: 07/08/1946 Referring Provider (PT): [name], MD   Encounter Date: 09/10/2020   PT End of Session - 09/10/20 0846    Visit Number 18    Number of Visits 25    Date for PT Re-Evaluation 09/17/20    Authorization Type [employer]    Progress Note Due on Visit 23   Recert on visit #84 - 5/36/46   PT Start Time 0846    PT Stop Time 0949    PT Time Calculation (min) 63 min    Activity Tolerance Patient limited by fatigue;Treatment limited secondary to medical complications (Comment)    Behavior During Therapy [name] for tasks assessed/performed           Past Medical History:  Diagnosis Date  . Allergy   . Anemia   . Anxiety    past hx   . Arthritis   . Asthma   . Atrial flutter ([place])    past history- not current  . CAD (coronary artery disease) CARDIOLOGIST--  DR Angelena Form   mild non-obstructive cad  . Cancer ([place])    right  . Cataract    bilaterally removed   . Chronic constipation   . Chronic kidney disease    interstitial cystitis  . COPD (chronic obstructive pulmonary disease) ([place])   . Depression    past hx   . Family history of malignant hyperthermia    father had this  . Fibromyalgia   . Frequency of urination   . GERD (gastroesophageal reflux disease)   . H/O hiatal hernia   . History of basal cell carcinoma excision    X2  . History of breast cancer ONCOLOGIST-- DR Jana Hakim---  NO RECURRANCE   DX 07/2012;  LOW GRADE DCIS  ER+PR+  ----  S/P RIGHT LUMPECTOMY WITH NEGATIVE MARGINS/   RADIATION ENDED 11/2012  . History of chronic bronchitis   . History of colonic polyps   . History of tachycardia    CONTROLLED  WITH ATENOLOL  . Hyperlipidemia   . Hypertension   . Neuromuscular disorder (H[facility])     fibromyalgia  . Osteoporosis 01/2019   T score -2.2 stable/improved from prior study  . Pelvic pain   . Personal history of radiation therapy   . S/P radiation therapy 11/12/12 - 12/05/12   right Breast  . Sepsis ([place]) 2014   from UTI   . Sinus headache   . Urgency of urination     Past Surgical History:  Procedure Laterality Date  . [place] STUDY N/A 04/03/2016   Procedure: [place] STUDY;  Surgeon: Manus Gunning, MD;  Location: [facility];  Service: Gastroenterology;  Laterality: N/A;  . BREAST LUMPECTOMY Right 10-11-2012   W/ SLN BX  . CARDIAC CATHETERIZATION  09-13-2007  DR Lia Foyer   WELL-PRESERVED LVF/  DIFFUSE SCATTERED CORONARY CALCIFACATION AND ATHEROSCLEROSIS WITHOUT OBSTRUCTION  . CARDIAC CATHETERIZATION  08-04-2010  DR Angelena Form   NON-OBSTRUCTIVE CAD/  pLAD 40%/  oLAD 30%/  mLAD 30%/  pRCA 30%/  EF 60%  . CARDIOVASCULAR STRESS TEST  06-18-2012  DR McALHANY   LOW RISK NUCLEAR STUDY/  SMALL FIXED AREA OF MODERATELY DECREASED UPTAKE IN ANTEROSEPTAL WALL WHICH MAY BE ARTIFACTUAL/  NO ISCHEMIA/  EF 68%  .  COLONOSCOPY  09-29-2010  . CYSTOSCOPY    . CYSTOSCOPY WITH HYDRODISTENSION AND BIOPSY N/A 03/06/2014   Procedure: CYSTOSCOPY/HYDRODISTENSION/ INSTILATION OF MARCAINE AND PYRIDIUM;  Surgeon: Ailene Rud, MD;  Location: [facility];  Service: Urology;  Laterality: N/A;  . ESOPHAGEAL MANOMETRY N/A 04/03/2016   Procedure: ESOPHAGEAL MANOMETRY (EM);  Surgeon: Manus Gunning, MD;  Location: [facility];  Service: Gastroenterology;  Laterality: N/A;  . EXTRACORPOREAL SHOCK WAVE LITHOTRIPSY Left 02/06/2019   Procedure: EXTRACORPOREAL SHOCK WAVE LITHOTRIPSY (ESWL);  Surgeon: Kathie Rhodes, MD;  Location: [facility];  Service: Urology;  Laterality: Left;  . NASAL SINUS SURGERY  1985  . ORIF RIGHT ANKLE  FX  2006  . POLYPECTOMY    . REMOVAL VOCAL CORD CYST  FEB 2014  . RIGHT BREAST BX  08-23-2012  . RIGHT HAND SURGERY  X3  LAST ONE 2009    INCLUDES  ORIF RIGHT 5TH FINGER AND REVISION TWICE  . SKIN CANCER EXCISION    . TONSILLECTOMY AND ADENOIDECTOMY  AGE 52  . TOTAL ABDOMINAL HYSTERECTOMY W/ BILATERAL SALPINGOOPHORECTOMY  1982   W/  APPENDECTOMY  . TRANSTHORACIC ECHOCARDIOGRAM  06-24-2012   GRADE I DIASTOLIC DYSFUNCTION/  EF 55-60%/  MILD MR  . UPPER GASTROINTESTINAL ENDOSCOPY      Vitals:   09/10/20 0903  BP: 134/82  Pulse: 67  SpO2: 94%     Subjective Assessment - 09/10/20 0903    Subjective Pt reports she had a fall coming out of the grocery store on 09/02/20 landing on her back and hitting her head causing a concussion (this is her 4th concussion). Notes "fog" from concussion but denies worsening of her dizziness.    Pertinent History Recurrent falls - 2 in past 6 months (past 5 weeks), 6 major falls in past 5-6 yrs (2 concussions, finger fracture); costochondritis, cellulitis of R breast, fibromyalgia, chronic LBP since ~age 42, DDD, sciatica, osteoporosis, degenerative scoliosis, peripheral neuropathy, vertigo, R knee pain, CAD, COPD, HTN, h/o lumpectomy for R breast cancer 2013, ORIF R ankle s/p fall 2006    Patient Stated Goals "Be able to gain some strength in my legs to help with my back. To be able stand w/o feeling like I'm swaying and walk w/o tripping."     Currently in Pain? Yes    Pain Score 7     Pain Location Back    Pain Orientation Lower;Left    Pain Descriptors / Indicators Sharp   "bruised"   Pain Type Acute pain;Chronic pain    Pain Onset In the past 7 days   fall on 09/02/20   Pain Frequency Constant    Aggravating Factors  fall on 09/02/20; up/down stairs; sitting, esp on toilet    Pain Score 8    Pain Location Head    Pain Orientation Left;Posterior   + frontal headache   Pain Descriptors / Indicators Aching;Throbbing    Pain Type Acute pain                             [facility] Adult PT Treatment/Exercise - 09/10/20 0846      Lumbar Exercises: Supine   Pelvic Tilt 10 reps;5  seconds      Modalities   Modalities Electrical Stimulation;Moist Heat      Moist Heat Therapy   Number Minutes Moist Heat 15 Minutes    Moist Heat Location Lumbar Spine      Electrical Stimulation   Electrical  Stimulation Location B lumbar paraspinals and SIJ    Electrical Stimulation Action IFC    Electrical Stimulation Parameters 80-150 Hz, intensity to pt tolerance x 15'    Electrical Stimulation Goals Pain;Tone      Manual Therapy   Manual Therapy Muscle Energy Technique;Joint mobilization;Soft tissue mobilization;Myofascial release    Joint Mobilization grade II-III sacral P/A mobs and sacral gapping    Soft tissue mobilization STM/DTM to lumbar parapsinals and L upper medial glutes    Myofascial Release manual TPR to L upper medial glutes    Muscle Energy Technique MET for correction of Lposterior innominate rotation of pelvis on sacrum followed by pelvic shotgun -                     PT Short Term Goals - 06/22/20 1015      PT SHORT TERM GOAL #1   Title Patient will be independent with initial HEP    Status Achieved   06/22/20     PT SHORT TERM GOAL #2   Title Complete FGA assessment    Status Achieved   06/22/20            PT Long Term Goals - 08/24/20 0855      PT LONG TERM GOAL #1   Title Patient will be independent with ongoing/advanced HEP    Status Partially Met      PT LONG TERM GOAL #2   Title Patient will demonstrate improved B LE strength to >/= 4/5 to 4+/5 for improved stability and ease of mobility    Status Partially Met    Target Date 09/17/20      PT LONG TERM GOAL #3   Title Patient will improve Berg score to >/= 52/56 to improve safety stability with ADLs in standing and reduce risk for falls    Status On-going   08/06/20 Merrilee Jansky = 49/56   Target Date 09/17/20      PT LONG TERM GOAL #4   Title Patient will report no further instances of falls    Status Partially Met   08/24/20 - reports a fall walking in home prior to leaving on  vacation   Target Date 09/17/20      PT LONG TERM GOAL #5   Title Patient will improve FGA score to >/= 20/30 to improve gait stability and reduce risk for falls    Status On-going   08/06/20 - FGA = 19/30   Target Date 09/17/20      PT LONG TERM GOAL #6   Title Patient to report no dizziness remaining with bed mobility.    Status Achieved   08/24/20                Plan - 09/10/20 0930    Clinical Impression Statement [name] reports she had a fall on 09/02/20 while leaving the grocery store - thinks she may have tripped over her feet initially feeling like she was falling forward but somehow landing on her L side and back, hitting the back of her head. Went to ED and was told she had a concussion. She notes fogginess from concussion but denies dizziness or worsening of her vestibular symptoms at this time.  She saw her PCP yesterday who thought her neuropathy may have contributed to the fall and has referred her to a neurologist, but she will not see the MD until October. VS checked and WNL today. Given recent trauma from fall and MD instruction to avoid  strenuous activity due to post-concussion, focused on addressing increased LBP from fall. SIJ assessment revealed apparent L posterior innominate rotation which was partially reducible with MET. Exercises focusing on gentle lumbopelvic motion to reduce pain. Session concluded with estim and moist heat to lumbar spine to promote further pain reduction.    Personal Factors and Comorbidities Comorbidity 3+;Age;Fitness;Past/Current Experience    Comorbidities Recurrent falls - 3 in past 6 months, 7 major falls in past 5-6 yrs (4 concussions, finger fracture); costochondritis, cellulitis of R breast, fibromyalgia, chronic LBP since ~age 72, DDD, sciatica, osteoporosis, degenerative scoliosis, peripheral neuropathy, vertigo, R knee pain, CAD, COPD, HTN, h/o lumpectomy for R breast cancer 2013, ORIF R ankle s/p fall 2006    Examination-Activity  Limitations Bathing;Bend;Carry;Dressing;Lift;Locomotion Level;Stand    Examination-Participation Restrictions Church;Community Activity;Interpersonal Relationship;Laundry;Meal Prep;Shop;Yard Work    Rehab Potential Good    PT Frequency 2x / week    PT Duration 6 weeks    PT Treatment/Interventions ADLs/Self Care Home Management;Cryotherapy;Electrical Stimulation;Iontophoresis 37m/ml Dexamethasone;Moist Heat;Ultrasound;DME Instruction;Gait training;Stair training;Functional mobility training;Therapeutic activities;Therapeutic exercise;Balance training;Neuromuscular re-education;Patient/family education;Manual techniques;Passive range of motion;Dry needling;Taping;Vasopneumatic Device;Vestibular;Visual/perceptual remediation/compensation;Spinal Manipulations    PT Next Visit Plan lumbopelvic strengthening and stabilization - HEP review/update PRN; balance training; manual therapy & modalities for pain including ionto patch and/or taping as benefit noted for B hip/greater trochanter pain; balance training; vestibular reassessment & treatment as indicated    PT Home Exercise Plan 6/24 - supported squat/eccentric stand>sit with yellow TB hip ABD isometric, seated yellow TB clam & marching; 7/23 - piriformis, ITB, hip flexor & HS stretches; 7/28 - yellow TB hook lying clam shell    Consulted and Agree with Plan of Care Patient           Patient will benefit from skilled therapeutic intervention in order to improve the following deficits and impairments:  Abnormal gait, Decreased activity tolerance, Decreased balance, Decreased coordination, Decreased endurance, Decreased knowledge of precautions, Decreased mobility, Decreased safety awareness, Decreased strength, Difficulty walking, Dizziness, Increased fascial restricitons, Increased muscle spasms, Impaired perceived functional ability, Impaired flexibility, Improper body mechanics, Postural dysfunction, Pain  Visit Diagnosis: Repeated  falls  Unsteadiness on feet  Muscle weakness (generalized)  Other abnormalities of gait and mobility  Dizziness and giddiness  Chronic bilateral low back pain without sciatica     Problem List Patient Active Problem List   Diagnosis Date Noted  . Low back pain 09/08/2020  . OSA and COPD overlap syndrome ([place] 06/10/2020  . Serotonin withdrawal syndrome without complication 063/84/5364 . Recurrent falls 05/02/2020  . Statin intolerance 02/29/2020  . RLS (restless legs syndrome) 11/09/2019  . Kidney stone 02/26/2019  . Recurrent sinusitis 02/25/2019  . Hx of colonic polyp 02/25/2019  . DDD (degenerative disc disease), cervical 09/17/2018  . Degenerative scoliosis 04/22/2018  . Primary insomnia 06/25/2017  . Chronic interstitial cystitis 02/01/2017  . Cough 01/09/2017  . Right arm pain 07/20/2016  . Deviated septum 05/12/2016  . Nasal turbinate hypertrophy 05/12/2016  . Dysphagia   . Allergic rhinitis 01/25/2016  . Other and combined forms of senile cataract 07/15/2015  . Dyspnea   . Left hip pain 04/30/2015  . Sciatica 04/30/2015  . Knee pain, right 04/30/2015  . Bilateral carpal tunnel syndrome 03/04/2015  . Hyperlipidemia 03/01/2015  . Pulmonary nodules 02/12/2015  . External hemorrhoids 02/05/2015  . Chronic night sweats 01/08/2015  . Atypical chest pain 01/01/2015  . Renal insufficiency 07/16/2014  . Memory loss 05/22/2014  . Peripheral neuropathy 05/22/2014  . Abdominal pain 10/26/2013  .  Headache 10/13/2013  . Arthralgia 08/18/2013  . ANA positive 07/29/2013  . Depression 02/05/2013  . Family history of malignant hyperthermia 02/05/2013  . Malignant neoplasm of lower-outer quadrant of right breast of female, estrogen receptor positive ([place]) 01/03/2013  . Splenic lesion 09/02/2012  . Asthma, allergic 08/05/2012  . GERD (gastroesophageal reflux disease) 06/03/2012  . Edema 04/08/2012  . Stricture and stenosis of esophagus 10/19/2011  . Functional  constipation 07/21/2011  . Nonspecific (abnormal) findings on radiological and other examination of biliary tract 07/21/2011  . Osteoporosis 04/17/2011  . Leg pain 02/24/2011  . FIBROCYSTIC BREAST DISEASE, HX OF 10/18/2010  . Essential hypertension 08/25/2010  . CAD in native artery 08/25/2010  . TOBACCO ABUSE, HX OF 08/25/2010  . GENERALIZED ANXIETY DISORDER 08/03/2010  . Fibromyalgia 01/11/2010    Percival Spanish, PT, MPT 09/10/2020, 12:34 PM  [facility] [address]  [place], [place], [address] Phone: [phone]   Fax:  [phone]  Name: KINJAL NEITZKE MRN: [id number] Date of Birth: 08/16/1946

## 2020-09-13 NOTE — Progress Notes (Signed)
09/13/2020 [name] [id number] 1946/09/12   Chief Complaint: Diarrhea, LLQ pain   History of Present Illness: [name] is a 74 year old female with a past medical history of anxiety, depression, arthritis, asthma, COPD, coronary artery disease, atrial flutter, breast cancer s/p right breast lumpectomy and radiation in 2013 interstitial cystitis and colon polyps.   She presents today for further evaluation regarding left lower quadrant abdominal pain and diarrhea which started 8 months ago.  She reported the onset of her left lower quadrant abdominal pain and diarrhea were abrupt.  No associated antibiotic use.  No obvious food or stress triggers.  She was previously constipated which she described as going 12 to 14 days without passing a bowel movement.  However, for the past 8 months she is passing 1-2 watery diarrhea stools with solid bits of brown stool daily.  No rectal bleeding or mucus per the rectum.  No melena.  She was seen by Dr. Larose Kells on 07/28/2020.  Urine culture was negative.  WBC 7.2.  Hemoglobin 12.3.  An abdominal/pelvic CT scan 08/06/2020 was normal, no acute process identified within the abdomen or pelvis.  She was prescribed Levsin for her left lower quadrant abdominal pain.  She stated her left lower quadrant abdominal pain abated 1 week ago.  She is  now having left lower back pain.  She has intermittent abdominal bloat.  No fever.  She reports having intermittent night sweats for the past 5 years which started after her diagnosis of breast cancer.  No weight loss.  She has gained 25 pounds over the past 1-1/2 years.  Her most recent colonoscopy was 12/24/2018 and 4 tubular adenomatous polyps were removed. No colitis. Father with history of diverticular disease which required colon surgery. Father with history of malignant hyperthermia.  Mother with history of diverticulosis, rectal and colon cancer then died from pancreatic cancer the age of 10.  Her daughter and son also  had diverticulosis.  She presented to [facility] on 09/02/2020 for she tripped and fell and hit the back of her head.  CT of the head without acute intracranial abnormalities.  She ws neurologically stable and she was discharged home on Norco for neck and back strain.   Abdominal/Pelvic CT with contrast 08/06/2020: Lower chest: Normal heart size. Lung bases are clear. No pleural effusion. Hepatobiliary: Liver is normal in size and contour. Subcentimeter too small to characterize low-attenuation lesions within the hepatic dome and left hepatic lobe. Gallbladder is unremarkable. No intrahepatic or extrahepatic biliary ductal dilatation. Pancreas: Unremarkable Spleen: Unremarkable Adrenals/Urinary Tract: Normal adrenal glands. Kidneys enhance symmetrically with contrast. No hydronephrosis. Urinary bladder is unremarkable. Stomach/Bowel: Small hiatal hernia. Normal morphology of the stomach. No evidence for bowel obstruction. No free fluid or free intraperitoneal air. Vascular/Lymphatic: Normal caliber abdominal aorta. Peripheral calcified atherosclerotic plaque. No retroperitoneal lymphadenopathy. Reproductive: Prior hysterectomy. Musculoskeletal: Lower thoracic and lumbar spine degenerative changes. No aggressive or acute appearing osseous lesions.   EGD 12/24/2018:  - Esophagogastric landmarks identified. - Normal esophagus - empiric dilation performed to 75mm with small mucosal wrent at the UES - Erythematous mucosa in the gastric body. - Normal duodenal bulb and second portion of the duodenum. - Biopsies were taken with a cold forceps for Helicobacter pylori testing.  Colonoscopy 12/24/2018: - One 4 mm polyp in the ascending colon, removed with a cold snare. Resected and retrieved. - One 4 mm polyp in the transverse colon, removed with a cold snare. Resected and retrieved. -  One 4 mm polyp in the descending colon, removed with a cold snare. Resected  and retrieved. - One 3 mm polyp in the sigmoid colon, removed with a cold snare. Resected and retrieved. - Anal papilla(e) were hypertrophied. Biopsied. - Tortuous colon, which prohibited ileal intubation. - Internal hemorrhoids. - Mostly adequate prep, fair in the cecum. - The examination was otherwise normal. No evidence of IBD or fistula. Suspect rectal bleeding is due to hemorrhoids in the setting of constipation.  1. Surgical [P], gastric antrum and gastric body - MILD REACTIVE GASTROPATHY. - NEGATIVE FOR HELICOBACTER PYLORI. - NO INTESTINAL METAPLASIA, DYSPLASIA, OR MALIGNANCY. 2. Surgical [P], transverse, ascending, sigmoid, polyp (2) - TUBULAR ADENOMA. - SESSILE SERRATED POLYP WITHOUT DYSPLASIA (X2 FRAGMENTS). - NO HIGH GRADE DYSPLASIA OR MALIGNANCY. 3. Surgical [P], anal papilla, polyp - POLYPOID FRAGMENT OF BENIGN SQUAMOUS MUCOSA. - NO DYSPLASIA OR MALIGNANCY.  Current Outpatient Medications on File Prior to Visit  Medication Sig Dispense Refill   acetaminophen (TYLENOL) 500 MG tablet Take 1,000 mg by mouth every 6 (six) hours as needed for mild pain or headache.      albuterol (PROVENTIL HFA;VENTOLIN HFA) 108 (90 BASE) MCG/ACT inhaler Inhale 2 puffs into the lungs every 4 (four) hours as needed. 1 Inhaler 5   amLODipine (NORVASC) 10 MG tablet Take 1 tablet (10 mg total) by mouth daily. 90 tablet 3   aspirin EC 81 MG tablet Take 1 tablet (81 mg total) by mouth daily. 90 tablet 3   atenolol (TENORMIN) 50 MG tablet Take 1 tablet (50 mg total) by mouth 2 (two) times daily. 180 tablet 1   denosumab (PROLIA) 60 MG/ML SOSY injection Inject 60 mg into the skin every 6 (six) months.     dicyclomine (BENTYL) 10 MG capsule Take 1 capsule (10 mg total) by mouth every 8 (eight) hours. 90 capsule 1   DULoxetine (CYMBALTA) 60 MG capsule Take 2 capsules (120 mg total) by mouth daily. 180 capsule 0   Estradiol 10 MCG TABS vaginal tablet Place 1 tablet (10 mcg total) vaginally 2  (two) times a week. 24 tablet 4   fluticasone (FLONASE) 50 MCG/ACT nasal spray Use 2 spray(s) in each nostril once daily 16 g 0   furosemide (LASIX) 20 MG tablet Take 1 tablet (20 mg total) by mouth as needed for edema. 90 tablet 3   gabapentin (NEURONTIN) 300 MG capsule Take 300 mg by mouth. 1cap in the morning, 1cap at noon, and 2caps at bedtime     hyoscyamine (LEVSIN SL) 0.125 MG SL tablet Place 1 tablet (0.125 mg total) under the tongue every 4 (four) hours as needed. 30 tablet 0   loratadine (CLARITIN) 10 MG tablet Take 1 tablet (10 mg total) by mouth daily. 30 tablet 11   losartan (COZAAR) 100 MG tablet Take 1 tablet by mouth once daily 90 tablet 2   mupirocin ointment (BACTROBAN) 2 % Apply thin film twice daily. 22 g 0   nitroGLYCERIN (NITROSTAT) 0.4 MG SL tablet Place 1 tablet (0.4 mg total) under the tongue every 5 (five) minutes as needed for chest pain. 25 tablet 1   omeprazole (PRILOSEC) 40 MG capsule Take 1 capsule by mouth twice daily 90 capsule 0   pentosan polysulfate (ELMIRON) 100 MG capsule Take 1 capsule (100 mg total) by mouth 3 (three) times daily. Reported on 01/05/2016 (Patient taking differently: Take 100 mg by mouth 3 (three) times daily with meals as needed. Reported on 01/05/2016) 90 capsule 11   pramipexole (  MIRAPEX) 0.125 MG tablet Take 1-2 tablets (0.125-0.25 mg total) by mouth in the morning and at bedtime. 60 tablet 5   REPATHA SURECLICK 818 MG/ML SOAJ INJECT 140 MG SUBCUTANEOUSLY EVERY 14 DAYS 2 pen 11   tiZANidine (ZANAFLEX) 2 MG tablet Take 0.5-2 tablets (1-4 mg total) by mouth every 8 (eight) hours as needed for muscle spasms. 40 tablet 1   traMADol (ULTRAM) 50 MG tablet Take 50-100 mg by mouth 2 (two) times daily as needed.     traZODone (DESYREL) 100 MG tablet TAKE 1 TABLET BY MOUTH AT BEDTIME 90 tablet 0   Current Facility-Administered Medications on File Prior to Visit  Medication Dose Route Frequency Provider Last Rate Last Admin    bupivacaine (MARCAINE) 0.5 % 10 mL, triamcinolone acetonide (KENALOG-40) 40 mg injection   Subcutaneous Once Carolan Clines, MD       bupivacaine (MARCAINE) 0.5 % 15 mL, phenazopyridine (PYRIDIUM) 400 mg bladder mixture   Bladder Instillation Once Carolan Clines, MD       Allergies  Allergen Reactions   Clindamycin Hcl Shortness Of Breath and Rash   Penicillins Anaphylaxis   Prednisone Shortness Of Breath and Rash   Rosuvastatin Anaphylaxis   Baclofen Other (See Comments) and Rash   Cortisone Other (See Comments)   Lincomycin Other (See Comments)   Codeine Hives and Other (See Comments)    headache   Erythromycin Base Other (See Comments)    other   Fluzone [Flu Virus Vaccine] Other (See Comments)    Local reaction at the site   Haemophilus Influenzae Other (See Comments)    Local reaction at the site Local reaction at the site   Latex Hives   Pentazocine Lactate Other (See Comments)    HALLUCINATION   Pneumococcal Vaccine Polyvalent Hives, Swelling and Other (See Comments)    REACTION: redness, swelling, and hives at injection site   Tamoxifen Nausea And Vomiting and Other (See Comments)    HEADACHE        Current Medications, Allergies, Past Medical History, Past Surgical History, Family History and Social History were reviewed in [employer] record.   Review of Systems:   Constitutional: Negative for fever, sweats, chills or weight loss.  Respiratory: Negative for shortness of breath.   Cardiovascular: Negative for chest pain, palpitations and leg swelling.  Gastrointestinal: See HPI.  Musculoskeletal: + back pain.  Neurological: Recent concussion.    Physical Exam: There were no vitals taken for this visit.  BP 110/68 (BP Location: Left Arm, Patient Position: Sitting, Cuff Size: Normal)    Pulse 76    Ht 5' 3.5" (1.613 m) Comment: height measured without shoes   Wt 137 lb (62.1 kg)    BMI 23.89 kg/m   General:  Well developed 74 year old female in no acute distress. Head: Normocephalic and atraumatic. Eyes: No scleral icterus. Conjunctiva pink . Ears: Normal auditory acuity. Mouth: Dentition intact. No ulcers or lesions.  Lungs: Clear throughout to auscultation. Heart: Regular rate and rhythm, no murmur. Abdomen: Soft, nondistended.  Mild generalized tenderness throughout the lower abdomen without rebound or guarding.  No masses or hepatomegaly. Normal bowel sounds x 4 quadrants.  Rectal: Deferred.  Musculoskeletal: Symmetrical with no gross deformities. Extremities: No edema. Neurological: Alert oriented x 4. No focal deficits.  Psychological: Alert and cooperative. Normal mood and affect  Assessment and Recommendations:  28.  74 year old female with diarrhea x 8 months. -GI pathogen panel. CBC, CMP and CRP. IgA and TTG.  -Florastor  probiotic 1 p.o. twice daily.  Ok to continue bacteria probiotic of choice as well. -Consider scheduling a diagnostic colonoscopy if GI pathogen panel negative and diarrhea persists.  2.  Left lower quadrant abdominal pain resolved 1 week ago, however, on exam she has generalized lower abdominal tenderness without rebound or guarding. -Await the above lab results and GI pathogen result.  Consider repeat abdominal/pelvic CT scan if her abdominal pain worsens. -IBgard 1 p.o. twice daily as needed abdominal pain, gas bloat  3. History of colon polyps. -Recall colonoscopy 11/2021   4. History of CAD. Last seen by Dr. Stanford Breed 05/25/2020, assessed to have atypical chest pain, patient preferred conservative measures, no further cardiac testing ordered.   5. History of COPD

## 2020-09-14 ENCOUNTER — Other Ambulatory Visit: Payer: Self-pay

## 2020-09-14 ENCOUNTER — Encounter: Payer: Self-pay | Admitting: Physical Therapy

## 2020-09-14 ENCOUNTER — Ambulatory Visit: Payer: Medicare Other | Admitting: Physical Therapy

## 2020-09-14 DIAGNOSIS — M6281 Muscle weakness (generalized): Secondary | ICD-10-CM | POA: Diagnosis not present

## 2020-09-14 DIAGNOSIS — R2681 Unsteadiness on feet: Secondary | ICD-10-CM

## 2020-09-14 DIAGNOSIS — M545 Low back pain: Secondary | ICD-10-CM | POA: Diagnosis not present

## 2020-09-14 DIAGNOSIS — R2689 Other abnormalities of gait and mobility: Secondary | ICD-10-CM

## 2020-09-14 DIAGNOSIS — R296 Repeated falls: Secondary | ICD-10-CM

## 2020-09-14 DIAGNOSIS — R42 Dizziness and giddiness: Secondary | ICD-10-CM | POA: Diagnosis not present

## 2020-09-14 DIAGNOSIS — G8929 Other chronic pain: Secondary | ICD-10-CM | POA: Diagnosis not present

## 2020-09-14 DIAGNOSIS — G44311 Acute post-traumatic headache, intractable: Secondary | ICD-10-CM | POA: Diagnosis not present

## 2020-09-14 NOTE — Therapy (Signed)
[facility] [address]  [place], [place], [address] Phone: [phone]   Fax:  [phone]  Physical Therapy Treatment  Patient Details  Name: [name] MRN: [id number] Date of Birth: 1946/10/25 Referring Provider (PT): [name], MD   Encounter Date: 09/14/2020   PT End of Session - 09/14/20 0848    Visit Number 19    Number of Visits 25    Date for PT Re-Evaluation 09/17/20    Authorization Type [employer]    Progress Note Due on Visit 23   Recert on visit #08 - 1/44/81   PT Start Time 0848    PT Stop Time 1000    PT Time Calculation (min) 72 min    Activity Tolerance Patient tolerated treatment well    Behavior During Therapy [facility] for tasks assessed/performed           Past Medical History:  Diagnosis Date  . Allergy   . Anemia   . Anxiety    past hx   . Arthritis   . Asthma   . Atrial flutter ([place])    past history- not current  . CAD (coronary artery disease) CARDIOLOGIST--  DR Angelena Form   mild non-obstructive cad  . Cancer ([place])    right  . Cataract    bilaterally removed   . Chronic constipation   . Chronic kidney disease    interstitial cystitis  . COPD (chronic obstructive pulmonary disease) ([place])   . Depression    past hx   . Family history of malignant hyperthermia    father had this  . Fibromyalgia   . Frequency of urination   . GERD (gastroesophageal reflux disease)   . H/O hiatal hernia   . History of basal cell carcinoma excision    X2  . History of breast cancer ONCOLOGIST-- DR Jana Hakim---  NO RECURRANCE   DX 07/2012;  LOW GRADE DCIS  ER+PR+  ----  S/P RIGHT LUMPECTOMY WITH NEGATIVE MARGINS/   RADIATION ENDED 11/2012  . History of chronic bronchitis   . History of colonic polyps   . History of tachycardia    CONTROLLED  WITH ATENOLOL  . Hyperlipidemia   . Hypertension   . Neuromuscular disorder (H[facility])    fibromyalgia  . Osteoporosis 01/2019   T score -2.2  stable/improved from prior study  . Pelvic pain   . Personal history of radiation therapy   . S/P radiation therapy 11/12/12 - 12/05/12   right Breast  . Sepsis ([place]) 2014   from UTI   . Sinus headache   . Urgency of urination     Past Surgical History:  Procedure Laterality Date  . [place] STUDY N/A 04/03/2016   Procedure: [place] STUDY;  Surgeon: Manus Gunning, MD;  Location: [facility];  Service: Gastroenterology;  Laterality: N/A;  . BREAST LUMPECTOMY Right 10-11-2012   W/ SLN BX  . CARDIAC CATHETERIZATION  09-13-2007  DR Lia Foyer   WELL-PRESERVED LVF/  DIFFUSE SCATTERED CORONARY CALCIFACATION AND ATHEROSCLEROSIS WITHOUT OBSTRUCTION  . CARDIAC CATHETERIZATION  08-04-2010  DR Angelena Form   NON-OBSTRUCTIVE CAD/  pLAD 40%/  oLAD 30%/  mLAD 30%/  pRCA 30%/  EF 60%  . CARDIOVASCULAR STRESS TEST  06-18-2012  DR McALHANY   LOW RISK NUCLEAR STUDY/  SMALL FIXED AREA OF MODERATELY DECREASED UPTAKE IN ANTEROSEPTAL WALL WHICH MAY BE ARTIFACTUAL/  NO ISCHEMIA/  EF 68%  . COLONOSCOPY  09-29-2010  .  CYSTOSCOPY    . CYSTOSCOPY WITH HYDRODISTENSION AND BIOPSY N/A 03/06/2014   Procedure: CYSTOSCOPY/HYDRODISTENSION/ INSTILATION OF MARCAINE AND PYRIDIUM;  Surgeon: Ailene Rud, MD;  Location: [facility];  Service: Urology;  Laterality: N/A;  . ESOPHAGEAL MANOMETRY N/A 04/03/2016   Procedure: ESOPHAGEAL MANOMETRY (EM);  Surgeon: Manus Gunning, MD;  Location: [facility];  Service: Gastroenterology;  Laterality: N/A;  . EXTRACORPOREAL SHOCK WAVE LITHOTRIPSY Left 02/06/2019   Procedure: EXTRACORPOREAL SHOCK WAVE LITHOTRIPSY (ESWL);  Surgeon: Kathie Rhodes, MD;  Location: [facility];  Service: Urology;  Laterality: Left;  . NASAL SINUS SURGERY  1985  . ORIF RIGHT ANKLE  FX  2006  . POLYPECTOMY    . REMOVAL VOCAL CORD CYST  FEB 2014  . RIGHT BREAST BX  08-23-2012  . RIGHT HAND SURGERY  X3  LAST ONE 2009   INCLUDES  ORIF RIGHT 5TH FINGER AND REVISION TWICE  .  SKIN CANCER EXCISION    . TONSILLECTOMY AND ADENOIDECTOMY  AGE 54  . TOTAL ABDOMINAL HYSTERECTOMY W/ BILATERAL SALPINGOOPHORECTOMY  1982   W/  APPENDECTOMY  . TRANSTHORACIC ECHOCARDIOGRAM  06-24-2012   GRADE I DIASTOLIC DYSFUNCTION/  EF 55-60%/  MILD MR  . UPPER GASTROINTESTINAL ENDOSCOPY      There were no vitals filed for this visit.   Subjective Assessment - 09/14/20 0853    Subjective Notes ongoing "fog" from concussion with blurred vision and occasional dizziness - reports episode last night where she turned too fast and had to grab the couch to stabilize herself. Back pain also remains flared up.    Pertinent History Recurrent falls - 3 in past 6 months, 7 major falls in past 5-6 yrs (4 concussions, finger fracture); costochondritis, cellulitis of R breast, fibromyalgia, chronic LBP since ~age 2, DDD, sciatica, osteoporosis, degenerative scoliosis, peripheral neuropathy, vertigo, R knee pain, CAD, COPD, HTN, h/o lumpectomy for R breast cancer 2013, ORIF R ankle s/p fall 2006    Patient Stated Goals "Be able to gain some strength in my legs to help with my back. To be able stand w/o feeling like I'm swaying and walk w/o tripping."    Currently in Pain? Yes    Pain Score 7     Pain Location Back    Pain Descriptors / Indicators Other (Comment)   "grinding"   Pain Type Acute pain;Chronic pain    Pain Onset --   fall on 09/02/20   Pain Frequency Constant    Pain Score 7    Pain Location Head    Pain Orientation Left;Posterior;Lateral    Pain Descriptors / Indicators Burning;Pressure;Other (Comment)   "sparklers"   Pain Type Acute pain    Pain Frequency Constant                             [facility] Adult PT Treatment/Exercise - 09/14/20 0848      Knee/Hip Exercises: Aerobic   Recumbent Bike L2 x 6 min      Electrical Stimulation   Electrical Stimulation Location L lumbar paraspinals & glutes, B UT    Electrical Stimulation Action Premod    Electrical Stimulation  Parameters intensity to pt tolerance x 15'    Electrical Stimulation Goals Pain;Tone      Manual Therapy   Manual Therapy Joint mobilization;Soft tissue mobilization;Myofascial release;Manual Traction;Passive ROM    Joint Mobilization L SIJ gapping    Soft tissue mobilization STM/DTM to B UT, LS, scalenes, cervical paraspinals,  subocciptals and temporalis muscles; STM/DTM to lumbar parapsinals and L upper medial glutes    Myofascial Release manual TPR to B UT & scalenes; suboccipital release; manual TPR to L upper medial glutes    Passive ROM manual B UT & LS stretches 2 x 30 sec    Manual Traction gentle cervical distraction 3 x 30 sec                    PT Short Term Goals - 06/22/20 1015      PT SHORT TERM GOAL #1   Title Patient will be independent with initial HEP    Status Achieved   06/22/20     PT SHORT TERM GOAL #2   Title Complete FGA assessment    Status Achieved   06/22/20            PT Long Term Goals - 08/24/20 0855      PT LONG TERM GOAL #1   Title Patient will be independent with ongoing/advanced HEP    Status Partially Met      PT LONG TERM GOAL #2   Title Patient will demonstrate improved B LE strength to >/= 4/5 to 4+/5 for improved stability and ease of mobility    Status Partially Met    Target Date 09/17/20      PT LONG TERM GOAL #3   Title Patient will improve Berg score to >/= 52/56 to improve safety stability with ADLs in standing and reduce risk for falls    Status On-going   08/06/20 Merrilee Jansky = 49/56   Target Date 09/17/20      PT LONG TERM GOAL #4   Title Patient will report no further instances of falls    Status Partially Met   08/24/20 - reports a fall walking in home prior to leaving on vacation   Target Date 09/17/20      PT LONG TERM GOAL #5   Title Patient will improve FGA score to >/= 20/30 to improve gait stability and reduce risk for falls    Status On-going   08/06/20 - FGA = 19/30   Target Date 09/17/20      PT LONG TERM  GOAL #6   Title Patient to report no dizziness remaining with bed mobility.    Status Achieved   08/24/20                Plan - 09/14/20 1000    Clinical Impression Statement [name] continues to report increased LBP as well as headache and fogginess/blurred vision from recent fall. She denies neck pain but upon palpation several TPs and areas of increased muscle tension identified - addressed with manual STM/MFR/TPR with pt noting significant reduction in headache along with lessening of the blurriness with her vision. Remainder of session focusing on manual therapy to address LBP including STM/MFR and TPR with pt noting decreased low back/L buttock pain and increased ability to lift/flex her hip (she had previously noted difficulty lifting her L leg when climbing stairs). Session concluded with estim to promote further muscle relaxation and pain reduction. Patient leaving clinic reporting she is feeling much better than on arrival. Given recent issues related to recent fall, will plan for recert next visit.    Personal Factors and Comorbidities Comorbidity 3+;Age;Fitness;Past/Current Experience    Comorbidities Recurrent falls - 3 in past 6 months, 7 major falls in past 5-6 yrs (4 concussions, finger fracture); costochondritis, cellulitis of R breast, fibromyalgia, chronic LBP since ~  age 42, DDD, sciatica, osteoporosis, degenerative scoliosis, peripheral neuropathy, vertigo, R knee pain, CAD, COPD, HTN, h/o lumpectomy for R breast cancer 2013, ORIF R ankle s/p fall 2006    Examination-Activity Limitations Bathing;Bend;Carry;Dressing;Lift;Locomotion Level;Stand    Examination-Participation Restrictions Church;Community Activity;Interpersonal Relationship;Laundry;Meal Prep;Shop;Yard Work    Rehab Potential Good    PT Frequency 2x / week    PT Duration 6 weeks    PT Treatment/Interventions ADLs/Self Care Home Management;Cryotherapy;Electrical Stimulation;Iontophoresis 4mg /ml Dexamethasone;Moist  Heat;Ultrasound;DME Instruction;Gait training;Stair training;Functional mobility training;Therapeutic activities;Therapeutic exercise;Balance training;Neuromuscular re-education;Patient/family education;Manual techniques;Passive range of motion;Dry needling;Taping;Vasopneumatic Device;Vestibular;Visual/perceptual remediation/compensation;Spinal Manipulations    PT Next Visit Plan lumbopelvic strengthening and stabilization - HEP review/update PRN; balance training; manual therapy & modalities for pain including ionto patch and/or taping as benefit noted for B hip/greater trochanter pain; balance training; vestibular reassessment & treatment as indicated    PT Home Exercise Plan 6/24 - supported squat/eccentric stand>sit with yellow TB hip ABD isometric, seated yellow TB clam & marching; 7/23 - piriformis, ITB, hip flexor & HS stretches; 7/28 - yellow TB hook lying clam shell    Consulted and Agree with Plan of Care Patient           Patient will benefit from skilled therapeutic intervention in order to improve the following deficits and impairments:  Abnormal gait, Decreased activity tolerance, Decreased balance, Decreased coordination, Decreased endurance, Decreased knowledge of precautions, Decreased mobility, Decreased safety awareness, Decreased strength, Difficulty walking, Dizziness, Increased fascial restricitons, Increased muscle spasms, Impaired perceived functional ability, Impaired flexibility, Improper body mechanics, Postural dysfunction, Pain  Visit Diagnosis: Repeated falls  Unsteadiness on feet  Muscle weakness (generalized)  Other abnormalities of gait and mobility  Dizziness and giddiness  Chronic bilateral low back pain without sciatica     Problem List Patient Active Problem List   Diagnosis Date Noted  . Low back pain 09/08/2020  . OSA and COPD overlap syndrome ([place]) 06/10/2020  . Serotonin withdrawal syndrome without complication 57/12/7791  . Recurrent falls  05/02/2020  . Statin intolerance 02/29/2020  . RLS (restless legs syndrome) 11/09/2019  . Kidney stone 02/26/2019  . Recurrent sinusitis 02/25/2019  . Hx of colonic polyp 02/25/2019  . DDD (degenerative disc disease), cervical 09/17/2018  . Degenerative scoliosis 04/22/2018  . Primary insomnia 06/25/2017  . Chronic interstitial cystitis 02/01/2017  . Cough 01/09/2017  . Right arm pain 07/20/2016  . Deviated septum 05/12/2016  . Nasal turbinate hypertrophy 05/12/2016  . Dysphagia   . Allergic rhinitis 01/25/2016  . Other and combined forms of senile cataract 07/15/2015  . Dyspnea   . Left hip pain 04/30/2015  . Sciatica 04/30/2015  . Knee pain, right 04/30/2015  . Bilateral carpal tunnel syndrome 03/04/2015  . Hyperlipidemia 03/01/2015  . Pulmonary nodules 02/12/2015  . External hemorrhoids 02/05/2015  . Chronic night sweats 01/08/2015  . Atypical chest pain 01/01/2015  . Renal insufficiency 07/16/2014  . Memory loss 05/22/2014  . Peripheral neuropathy 05/22/2014  . Abdominal pain 10/26/2013  . Headache 10/13/2013  . Arthralgia 08/18/2013  . ANA positive 07/29/2013  . Depression 02/05/2013  . Family history of malignant hyperthermia 02/05/2013  . Malignant neoplasm of lower-outer quadrant of right breast of female, estrogen receptor positive ([place]) 01/03/2013  . Splenic lesion 09/02/2012  . Asthma, allergic 08/05/2012  . GERD (gastroesophageal reflux disease) 06/03/2012  . Edema 04/08/2012  . Stricture and stenosis of esophagus 10/19/2011  . Functional constipation 07/21/2011  . Nonspecific (abnormal) findings on radiological and other examination of biliary tract 07/21/2011  . Osteoporosis  04/17/2011  . Leg pain 02/24/2011  . FIBROCYSTIC BREAST DISEASE, HX OF 10/18/2010  . Essential hypertension 08/25/2010  . CAD in native artery 08/25/2010  . TOBACCO ABUSE, HX OF 08/25/2010  . GENERALIZED ANXIETY DISORDER 08/03/2010  . Fibromyalgia 01/11/2010    Percival Spanish,  PT, MPT 09/14/2020, 10:15 AM  [facility] [address]  [place], [place], [address] Phone: [phone]   Fax:  [phone]  Name: [name] MRN: [id number] Date of Birth: January 29, 1946

## 2020-09-15 ENCOUNTER — Encounter: Payer: Self-pay | Admitting: Nurse Practitioner

## 2020-09-15 ENCOUNTER — Ambulatory Visit: Payer: Medicare Other | Admitting: Nurse Practitioner

## 2020-09-15 ENCOUNTER — Other Ambulatory Visit (INDEPENDENT_AMBULATORY_CARE_PROVIDER_SITE_OTHER): Payer: Medicare Other

## 2020-09-15 ENCOUNTER — Other Ambulatory Visit: Payer: Self-pay | Admitting: Family Medicine

## 2020-09-15 ENCOUNTER — Encounter: Payer: Self-pay | Admitting: Family Medicine

## 2020-09-15 VITALS — BP 110/68 | HR 76 | Ht 63.5 in | Wt 137.0 lb

## 2020-09-15 DIAGNOSIS — M5030 Other cervical disc degeneration, unspecified cervical region: Secondary | ICD-10-CM

## 2020-09-15 DIAGNOSIS — R296 Repeated falls: Secondary | ICD-10-CM

## 2020-09-15 DIAGNOSIS — R197 Diarrhea, unspecified: Secondary | ICD-10-CM

## 2020-09-15 DIAGNOSIS — R1032 Left lower quadrant pain: Secondary | ICD-10-CM | POA: Diagnosis not present

## 2020-09-15 DIAGNOSIS — M545 Low back pain, unspecified: Secondary | ICD-10-CM

## 2020-09-15 DIAGNOSIS — G6289 Other specified polyneuropathies: Secondary | ICD-10-CM

## 2020-09-15 LAB — COMPREHENSIVE METABOLIC PANEL
ALT: 11 U/L (ref 0–35)
AST: 17 U/L (ref 0–37)
Albumin: 4.1 g/dL (ref 3.5–5.2)
Alkaline Phosphatase: 104 U/L (ref 39–117)
BUN: 13 mg/dL (ref 6–23)
CO2: 32 mEq/L (ref 19–32)
Calcium: 8.9 mg/dL (ref 8.4–10.5)
Chloride: 104 mEq/L (ref 96–112)
Creatinine, Ser: 1.03 mg/dL (ref 0.40–1.20)
GFR: 52.21 mL/min — ABNORMAL LOW (ref >60.00)
Glucose, Bld: 101 mg/dL — ABNORMAL HIGH (ref 70–99)
Potassium: 4.8 mEq/L (ref 3.5–5.1)
Sodium: 139 mEq/L (ref 135–145)
Total Bilirubin: 0.5 mg/dL (ref 0.2–1.2)
Total Protein: 7 g/dL (ref 6.0–8.3)

## 2020-09-15 LAB — CBC WITH DIFFERENTIAL/PLATELET
Basophils Absolute: 0 10*3/uL (ref 0.0–0.1)
Basophils Relative: 0.4 % (ref 0.0–3.0)
Eosinophils Absolute: 0.3 10*3/uL (ref 0.0–0.7)
Eosinophils Relative: 2.9 % (ref 0.0–5.0)
HCT: 39 % (ref 36.0–46.0)
Hemoglobin: 12.8 g/dL (ref 12.0–15.0)
Lymphocytes Relative: 19.3 % (ref 12.0–46.0)
Lymphs Abs: 1.7 10*3/uL (ref 0.7–4.0)
MCHC: 32.8 g/dL (ref 30.0–36.0)
MCV: 92.4 fl (ref 78.0–100.0)
Monocytes Absolute: 0.7 10*3/uL (ref 0.1–1.0)
Monocytes Relative: 8.3 % (ref 3.0–12.0)
Neutro Abs: 6.1 10*3/uL (ref 1.4–7.7)
Neutrophils Relative %: 69.1 % (ref 43.0–77.0)
Platelets: 281 10*3/uL (ref 150.0–400.0)
RBC: 4.22 Mil/uL (ref 3.87–5.11)
RDW: 15.3 % (ref 11.5–15.5)
WBC: 8.8 10*3/uL (ref 4.0–10.5)

## 2020-09-15 LAB — IGA: IgA: 168 mg/dL (ref 68–378)

## 2020-09-15 LAB — C-REACTIVE PROTEIN: CRP: 1.7 mg/dL (ref 0.5–20.0)

## 2020-09-15 NOTE — Progress Notes (Signed)
Agree with assessment and plan as outlined.  

## 2020-09-15 NOTE — Patient Instructions (Signed)
If you are age 75 or older, your body mass index should be between 23-30. Your Body mass index is 23.89 kg/m. If this is out of the aforementioned range listed, please consider follow up with your Primary Care Provider.  If you are age 25 or younger, your body mass index should be between 19-25. Your Body mass index is 23.89 kg/m. If this is out of the aformentioned range listed, please consider follow up with your Primary Care Provider.   Your provider has requested that you go to the basement level for lab work before leaving today. Press "B" on the elevator. The lab is located at the first door on the left as you exit the elevator.  Please purchase the following medications over the counter and take as directed:  Florastor probiotic 1 tablet twice daily, for 2-4 weeks. IBguard 1 capsule twice daily for abdominal bloat/gas  Call the office if your symptoms worsen. Follow up in 2 months.  Due to recent changes in healthcare laws, you may see the results of your imaging and laboratory studies on MyChart before your provider has had a chance to review them.  We understand that in some cases there may be results that are confusing or concerning to you. Not all laboratory results come back in the same time frame and the provider may be waiting for multiple results in order to interpret others.  Please give [place] 48 hours in order for your provider to thoroughly review all the results before contacting the office for clarification of your results.   Thank you for choosing [facility] Noralyn Pick, CRNP  [phone]

## 2020-09-16 ENCOUNTER — Ambulatory Visit (HOSPITAL_BASED_OUTPATIENT_CLINIC_OR_DEPARTMENT_OTHER)
Admission: RE | Admit: 2020-09-16 | Discharge: 2020-09-16 | Disposition: A | Discharge status: Home / Self Care | Payer: Medicare Other | Source: Ambulatory Visit | Attending: Family Medicine | Admitting: Family Medicine

## 2020-09-16 ENCOUNTER — Other Ambulatory Visit: Payer: Self-pay

## 2020-09-16 ENCOUNTER — Encounter: Payer: Self-pay | Admitting: Physical Therapy

## 2020-09-16 ENCOUNTER — Ambulatory Visit: Payer: Medicare Other | Admitting: Physical Therapy

## 2020-09-16 DIAGNOSIS — G44311 Acute post-traumatic headache, intractable: Secondary | ICD-10-CM | POA: Diagnosis not present

## 2020-09-16 DIAGNOSIS — R2681 Unsteadiness on feet: Secondary | ICD-10-CM | POA: Diagnosis not present

## 2020-09-16 DIAGNOSIS — G8929 Other chronic pain: Secondary | ICD-10-CM

## 2020-09-16 DIAGNOSIS — R2689 Other abnormalities of gait and mobility: Secondary | ICD-10-CM

## 2020-09-16 DIAGNOSIS — M6281 Muscle weakness (generalized): Secondary | ICD-10-CM | POA: Diagnosis not present

## 2020-09-16 DIAGNOSIS — M545 Low back pain, unspecified: Secondary | ICD-10-CM

## 2020-09-16 DIAGNOSIS — M5030 Other cervical disc degeneration, unspecified cervical region: Secondary | ICD-10-CM

## 2020-09-16 DIAGNOSIS — R42 Dizziness and giddiness: Secondary | ICD-10-CM

## 2020-09-16 DIAGNOSIS — R296 Repeated falls: Secondary | ICD-10-CM

## 2020-09-16 LAB — TISSUE TRANSGLUTAMINASE, IGA: (tTG) Ab, IgA: <1 U/mL

## 2020-09-16 NOTE — Therapy (Signed)
[facility] [address]  [place], [place], [address] Phone: [phone]   Fax:  [phone]  Physical Therapy Treatment / Recert  Patient Details  Name: [name] MRN: [id number] Date of Birth: [birth year] Referring Provider (PT): [name], MD  Progress Note  Reporting Period 08/06/2020 to 09/16/2020  See note below for Objective Data and Assessment of Progress/Goals.      Encounter Date: 09/16/2020   PT End of Session - 09/16/20 0849    Visit Number 20    Number of Visits 28    Date for PT Re-Evaluation 10/14/20    Authorization Type [employer]    PT Start Time 1324    PT Stop Time 0954    PT Time Calculation (min) 65 min    Activity Tolerance Patient tolerated treatment well;Patient limited by pain    Behavior During Therapy WFL for tasks assessed/performed           Past Medical History:  Diagnosis Date   Allergy    Anemia    Anxiety    past hx    Arthritis    Asthma    Atrial flutter ([place])    past history- not current   CAD (coronary artery disease) CARDIOLOGIST--  DR Angelena Form   mild non-obstructive cad   Cancer ([place])    right   Cataract    bilaterally removed    Chronic constipation    Chronic kidney disease    interstitial cystitis   Concussion    x 3   COPD (chronic obstructive pulmonary disease) (HCC)    Depression    past hx    Family history of malignant hyperthermia    father had this   Fibromyalgia    Frequency of urination    GERD (gastroesophageal reflux disease)    H/O hiatal hernia    History of basal cell carcinoma excision    X2   History of breast cancer ONCOLOGIST-- DR Jana Hakim---  NO RECURRANCE   DX 07/2012;  LOW GRADE DCIS  ER+PR+  ----  S/P RIGHT LUMPECTOMY WITH NEGATIVE MARGINS/   RADIATION ENDED 11/2012   History of chronic bronchitis    History of colonic polyps    History of tachycardia    CONTROLLED  WITH ATENOLOL    Hyperlipidemia    Hypertension    Neuromuscular disorder (HCC)    fibromyalgia   Osteoporosis 01/2019   T score -2.2 stable/improved from prior study   Pelvic pain    Personal history of radiation therapy    S/P radiation therapy 11/12/12 - 12/05/12   right Breast   Sepsis ([place]) 2014   from UTI    Sinus headache    Urgency of urination     Past Surgical History:  Procedure Laterality Date   66 HOUR [place] STUDY N/A 04/03/2016   Procedure: 24 HOUR PH STUDY;  Surgeon: Manus Gunning, MD;  Location: [facility];  Service: Gastroenterology;  Laterality: N/A;   BREAST LUMPECTOMY Right 10-11-2012   W/ SLN BX   CARDIAC CATHETERIZATION  09-13-2007  DR Lia Foyer   WELL-PRESERVED LVF/  DIFFUSE SCATTERED CORONARY CALCIFACATION AND ATHEROSCLEROSIS WITHOUT OBSTRUCTION   CARDIAC CATHETERIZATION  08-04-2010  DR MCALHANY   NON-OBSTRUCTIVE CAD/  pLAD 40%/  oLAD 30%/  mLAD 30%/  pRCA 30%/  EF 60%   CARDIOVASCULAR STRESS TEST  06-18-2012  DR McALHANY   LOW RISK NUCLEAR STUDY/  SMALL FIXED AREA OF MODERATELY DECREASED UPTAKE  IN ANTEROSEPTAL WALL WHICH MAY BE ARTIFACTUAL/  NO ISCHEMIA/  EF 68%   COLONOSCOPY  09-29-2010   CYSTOSCOPY     CYSTOSCOPY WITH HYDRODISTENSION AND BIOPSY N/A 03/06/2014   Procedure: CYSTOSCOPY/HYDRODISTENSION/ INSTILATION OF MARCAINE AND PYRIDIUM;  Surgeon: Ailene Rud, MD;  Location: [facility];  Service: Urology;  Laterality: N/A;   ESOPHAGEAL MANOMETRY N/A 04/03/2016   Procedure: ESOPHAGEAL MANOMETRY (EM);  Surgeon: Manus Gunning, MD;  Location: [facility];  Service: Gastroenterology;  Laterality: N/A;   EXTRACORPOREAL SHOCK WAVE LITHOTRIPSY Left 02/06/2019   Procedure: EXTRACORPOREAL SHOCK WAVE LITHOTRIPSY (ESWL);  Surgeon: Kathie Rhodes, MD;  Location: [facility];  Service: Urology;  Laterality: Left;   NASAL SINUS SURGERY  1985   ORIF RIGHT ANKLE  FX  2006   POLYPECTOMY     REMOVAL VOCAL CORD CYST  FEB 2014   RIGHT  BREAST BX  08-23-2012   RIGHT HAND SURGERY  X3  LAST ONE 2009   INCLUDES  ORIF RIGHT 5TH FINGER AND REVISION TWICE   SKIN CANCER EXCISION     TONSILLECTOMY AND ADENOIDECTOMY  AGE 29   TOTAL ABDOMINAL HYSTERECTOMY W/ BILATERAL SALPINGOOPHORECTOMY  1982   W/  APPENDECTOMY   TRANSTHORACIC ECHOCARDIOGRAM  06-24-2012   GRADE I DIASTOLIC DYSFUNCTION/  EF 55-60%/  MILD MR   UPPER GASTROINTESTINAL ENDOSCOPY      There were no vitals filed for this visit.   Subjective Assessment - 09/16/20 0857    Subjective Pt reports good relief of headache and reduction in LBP following last visit lasting until the next morning but pain has now returned with headache less intense but LBP more severe. Pt states she had an x-ray of her lumbar spine this morning due to ongoing LBP.    Pertinent History Recurrent falls - 3 in past 6 months, 7 major falls in past 5-6 yrs (4 concussions, finger fracture); costochondritis, cellulitis of R breast, fibromyalgia, chronic LBP since ~age 18, DDD, sciatica, osteoporosis, degenerative scoliosis, peripheral neuropathy, vertigo, R knee pain, CAD, COPD, HTN, h/o lumpectomy for R breast cancer 2013, ORIF R ankle s/p fall 2006    Patient Stated Goals "Be able to gain some strength in my legs to help with my back. To be able stand w/o feeling like I'm swaying and walk w/o tripping."    Currently in Pain? Yes    Pain Score 8    7-8/10   Pain Location Back   & SIJ   Pain Orientation Lower;Left    Pain Descriptors / Indicators Sharp;Burning;Sore    Pain Type Acute pain;Chronic pain    Pain Onset --   fall on 09/02/20   Pain Frequency Constant    Aggravating Factors  fall on 09/02/20; up/down stairs; sitting, esp on toilet    Pain Score 6   5-6/10   Pain Location Head    Pain Orientation Posterior;Lower    Pain Descriptors / Indicators Sore;Burning;Shooting    Pain Type Acute pain    Pain Frequency Constant              [facility] PT Assessment - 09/16/20 0849      Assessment    Medical Diagnosis Multiple falls    Referring Provider (PT) [name], MD      Prior Function   Level of Independence Independent    Vocation Retired    [employer] retired [employer], reading, sewing, being outdoors (feels uncomfortable in back yard due to slope), walking  1/2 mile 2x/wk      Strength   Overall Strength Comments MMT deferred today d/t severe LBP from recent fall      Standardized Balance Assessment   10 Meter Walk 8.97 sec      Berg Balance Test   Sit to Stand Able to stand without using hands and stabilize independently    Standing Unsupported Able to stand safely 2 minutes    Sitting with Back Unsupported but Feet Supported on Floor or Stool Able to sit safely and securely 2 minutes    Stand to Sit Sits safely with minimal use of hands    Transfers Able to transfer safely, minor use of hands    Standing Unsupported with Eyes Closed Able to stand 10 seconds safely    Standing Unsupported with Feet Together Able to place feet together independently and stand 1 minute safely    From Standing, Reach Forward with Outstretched Arm Can reach confidently >25 cm (10")    From Standing Position, Pick up Object from Floor Able to pick up shoe safely and easily    From Standing Position, Turn to Look Behind Over each Shoulder Looks behind from both sides and weight shifts well    Turn 360 Degrees Able to turn 360 degrees safely in 4 seconds or less   dizzy following turn   Standing Unsupported, Alternately Place Feet on Step/Stool Able to stand independently and safely and complete 8 steps in 20 seconds    Standing Unsupported, One Foot in Front Able to plae foot ahead of the other independently and hold 30 seconds    Standing on One Leg Tries to lift leg/unable to hold 3 seconds but remains standing independently    Total Score 52    Berg comment: 52-55 lower risk for falls (> 25%)      Functional Gait  Assessment   Gait Level Surface Walks 20 ft  in less than 5.5 sec, no assistive devices, good speed, no evidence for imbalance, normal gait pattern, deviates no more than 6 in outside of the 12 in walkway width.    Change in Gait Speed Able to change speed, demonstrates mild gait deviations, deviates 6-10 in outside of the 12 in walkway width, or no gait deviations, unable to achieve a major change in velocity, or uses a change in velocity, or uses an assistive device.    Gait with Horizontal Head Turns Performs head turns with moderate changes in gait velocity, slows down, deviates 10-15 in outside 12 in walkway width but recovers, can continue to walk.    Gait with Vertical Head Turns Performs task with slight change in gait velocity (eg, minor disruption to smooth gait path), deviates 6 - 10 in outside 12 in walkway width or uses assistive device    Gait and Pivot Turn Pivot turns safely within 3 sec and stops quickly with no loss of balance.    Step Over Obstacle Is able to step over 2 stacked shoe boxes taped together (9 in total height) without changing gait speed. No evidence of imbalance.    Gait with Narrow Base of Support Ambulates less than 4 steps heel to toe or cannot perform without assistance.    Gait with Eyes Closed Walks 20 ft, uses assistive device, slower speed, mild gait deviations, deviates 6-10 in outside 12 in walkway width. Ambulates 20 ft in less than 9 sec but greater than 7 sec.    Ambulating Backwards Walks 20 ft, uses assistive device, slower  speed, mild gait deviations, deviates 6-10 in outside 12 in walkway width.    Steps Alternating feet, must use rail.    Total Score 20    FGA comment: 19-24 = medium risk fall                         [facility] Adult PT Treatment/Exercise - 09/16/20 0849      Lumbar Exercises: Stretches   Piriformis Stretch Right;Left;30 seconds;2 reps    Piriformis Stretch Limitations supine KTOS    Figure 4 Stretch 30 seconds;2 reps;With overpressure;Supine    Figure 4 Stretch  Limitations B single leg figure-4 to chest      Manual Therapy   Manual Therapy Joint mobilization;Soft tissue mobilization;Myofascial release;Muscle Energy Technique    Joint Mobilization L hip A/P mobs to promote correction SIJ alignment - pt noting feeling of something "shifting" with alignment normalized upon reassessment    Soft tissue mobilization STM/DTM to B UT, LS, scalenes, cervical paraspinals and subocciptals; STM/DTM to lumbar parapsinals and L upper medial glutes/piriformis    Myofascial Release manual TPR to R>L UT & scalenes; suboccipital release; manual TPR to L glutes & piriformis    Muscle Energy Technique MET for correction of SIJ alignment d/t apparent L downslip +/- posterior innominate rotation of pelvis on sacrum - alignment appreared normalized following MET/MT                    PT Short Term Goals - 06/22/20 1015      PT SHORT TERM GOAL #1   Title Patient will be independent with initial HEP    Status Achieved   06/22/20     PT SHORT TERM GOAL #2   Title Complete FGA assessment    Status Achieved   06/22/20            PT Long Term Goals - 09/16/20 0920      PT LONG TERM GOAL #1   Title Patient will be independent with ongoing/advanced HEP    Status Partially Met   09/16/20 - met for current HEP   Target Date 10/14/20      PT LONG TERM GOAL #2   Title Patient will demonstrate improved B LE strength to >/= 4/5 to 4+/5 for improved stability and ease of mobility    Status Unable to assess   09/16/20 - MMT deferred d/t severe LBP from recent fall   Target Date 10/14/20      PT LONG TERM GOAL #3   Title Patient will improve Berg score to >/= 52/56 to improve safety stability with ADLs in standing and reduce risk for falls    Status Achieved   09/16/20 [name] = 52/56     PT LONG TERM GOAL #4   Title Patient will report no further instances of falls    Status On-going   09/16/20 - most recent fall on 09/02/20   Target Date 10/14/20      PT LONG  TERM GOAL #5   Title Patient will improve FGA score to >/= 24/30 to improve gait stability and reduce risk for falls    Baseline Patient will improve FGA score to >/= 20/30 to improve gait stability and reduce risk for falls - met as of 09/16/20 & revised    Status Revised   09/16/20 - FGA = 20/30   Target Date 10/14/20      PT LONG TERM GOAL #6   Title Patient  to report no dizziness remaining with bed mobility.    Status Achieved   08/24/20                Plan - 09/16/20 0954    Clinical Impression Statement [name] reports good pain relief following last visit lasting until the next morning but then noting return of LBP and headache which has been exacerbated since fall on 09/02/20 at which time she landed on the left side of her low back and suffered a concussion. Increased pain since fall has limited her ability to participate in therapy sessions and fogginess from concussion has exacerbated her balance issues. Despite this she has continued to demonstrate progress with balance per standardized testing from most recent assessment with Berg increased from 49/56 to 52/56 (44/56 as of eval) and FGA improved from 19/30 to 20/30 (13/30 as of eval) - associated LTGs met (FGA revised). Remaining goals partially met or ongoing with strength testing deferred due to increased LBP today. Aside from issues related to recent fall, [name] notes significant improvement with PT with overall decreased pain, increased strength, resolution of prior BPPV/vertigo symptoms and improving balance, however given recent fall and new deficits resulting from this will recommend recert for additional 2x/wk for up to 4 weeks to allow for PT to address post-concussion related deficits, LBP and ongoing balance related deficits.    Personal Factors and Comorbidities Comorbidity 3+;Age;Fitness;Past/Current Experience    Comorbidities Recurrent falls - 3 in past 6 months, 7 major falls in past 5-6 yrs (4 concussions, finger  fracture); costochondritis, cellulitis of R breast, fibromyalgia, chronic LBP since ~age 31, DDD, sciatica, osteoporosis, degenerative scoliosis, peripheral neuropathy, vertigo, R knee pain, CAD, COPD, HTN, h/o lumpectomy for R breast cancer 2013, ORIF R ankle s/p fall 2006    Examination-Activity Limitations Bathing;Bend;Carry;Dressing;Lift;Locomotion Level;Stand    Examination-Participation Restrictions Church;Community Activity;Interpersonal Relationship;Laundry;Meal Prep;Shop;Yard Work    Rehab Potential Good    PT Frequency 2x / week    PT Duration 4 weeks    PT Treatment/Interventions ADLs/Self Care Home Management;Cryotherapy;Electrical Stimulation;Iontophoresis 4mg /ml Dexamethasone;Moist Heat;Ultrasound;DME Instruction;Gait training;Stair training;Functional mobility training;Therapeutic activities;Therapeutic exercise;Balance training;Neuromuscular re-education;Patient/family education;Manual techniques;Passive range of motion;Dry needling;Taping;Vasopneumatic Device;Vestibular;Visual/perceptual remediation/compensation;Spinal Manipulations    PT Next Visit Plan lumbopelvic strengthening and stabilization - HEP review/update PRN; balance training; post-concussion management; manual therapy & modalities for pain including ionto patch and/or taping as benefit noted for B hip/greater trochanter pain; balance training; vestibular reassessment & treatment as indicated    PT Home Exercise Plan 6/24 - supported squat/eccentric stand>sit with yellow TB hip ABD isometric, seated yellow TB clam & marching; 7/23 - piriformis, ITB, hip flexor & HS stretches; 7/28 - yellow TB hook lying clam shell; 8/18 - chin tuck, UT & SCM stretches, scap retraction; 8/19 - seated horiz & vertical gaze stabilization; 8/31 - corner balance progression    Consulted and Agree with Plan of Care Patient           Patient will benefit from skilled therapeutic intervention in order to improve the following deficits and  impairments:  Abnormal gait, Decreased activity tolerance, Decreased balance, Decreased coordination, Decreased endurance, Decreased knowledge of precautions, Decreased mobility, Decreased safety awareness, Decreased strength, Difficulty walking, Dizziness, Increased fascial restricitons, Increased muscle spasms, Impaired perceived functional ability, Impaired flexibility, Improper body mechanics, Postural dysfunction, Pain  Visit Diagnosis: Repeated falls - Plan: PT plan of care cert/re-cert  Unsteadiness on feet - Plan: PT plan of care cert/re-cert  Muscle weakness (generalized) - Plan: PT plan of care cert/re-cert  Other abnormalities of gait and mobility - Plan: PT plan of care cert/re-cert  Dizziness and giddiness - Plan: PT plan of care cert/re-cert  Chronic bilateral low back pain without sciatica - Plan: PT plan of care cert/re-cert  Intractable acute post-traumatic headache - Plan: PT plan of care cert/re-cert     Problem List Patient Active Problem List   Diagnosis Date Noted   Low back pain 09/08/2020   OSA and COPD overlap syndrome ([place]) 06/10/2020   Serotonin withdrawal syndrome without complication 58/59/2924   Recurrent falls 05/02/2020   Statin intolerance 02/29/2020   RLS (restless legs syndrome) 11/09/2019   Kidney stone 02/26/2019   Recurrent sinusitis 02/25/2019   Hx of colonic polyp 02/25/2019   DDD (degenerative disc disease), cervical 09/17/2018   Degenerative scoliosis 04/22/2018   Primary insomnia 06/25/2017   Chronic interstitial cystitis 02/01/2017   Cough 01/09/2017   Right arm pain 07/20/2016   Deviated septum 05/12/2016   Nasal turbinate hypertrophy 05/12/2016   Dysphagia    Allergic rhinitis 01/25/2016   Other and combined forms of senile cataract 07/15/2015   Dyspnea    Left hip pain 04/30/2015   Sciatica 04/30/2015   Knee pain, right 04/30/2015   Bilateral carpal tunnel syndrome 03/04/2015   Hyperlipidemia  03/01/2015   Pulmonary nodules 02/12/2015   External hemorrhoids 02/05/2015   Chronic night sweats 01/08/2015   Atypical chest pain 01/01/2015   Renal insufficiency 07/16/2014   Memory loss 05/22/2014   Peripheral neuropathy 05/22/2014   Abdominal pain 10/26/2013   Headache 10/13/2013   Arthralgia 08/18/2013   ANA positive 07/29/2013   Depression 02/05/2013   Family history of malignant hyperthermia 02/05/2013   Malignant neoplasm of lower-outer quadrant of right breast of female, estrogen receptor positive ([place]) 01/03/2013   Splenic lesion 09/02/2012   Asthma, allergic 08/05/2012   GERD (gastroesophageal reflux disease) 06/03/2012   Edema 04/08/2012   Stricture and stenosis of esophagus 10/19/2011   Functional constipation 07/21/2011   Nonspecific (abnormal) findings on radiological and other examination of biliary tract 07/21/2011   Osteoporosis 04/17/2011   Leg pain 02/24/2011   FIBROCYSTIC BREAST DISEASE, HX OF 10/18/2010   Essential hypertension 08/25/2010   CAD in native artery 08/25/2010   TOBACCO ABUSE, HX OF 08/25/2010   GENERALIZED ANXIETY DISORDER 08/03/2010   Fibromyalgia 01/11/2010    [name] 09/16/2020, 12:42 PM  [facility] [address]  [place], [place], [address] Phone: [phone]   Fax:  [phone]  Name: [name] MRN: [id number] Date of Birth: [birth year]

## 2020-09-17 ENCOUNTER — Telehealth: Payer: Self-pay

## 2020-09-17 NOTE — Telephone Encounter (Signed)
Pt. Has been notified and is currently going to [place] across from [place], and would like to continue to use their services.

## 2020-09-20 ENCOUNTER — Ambulatory Visit: Payer: Medicare Other

## 2020-09-20 ENCOUNTER — Other Ambulatory Visit: Payer: Self-pay

## 2020-09-20 DIAGNOSIS — G44311 Acute post-traumatic headache, intractable: Secondary | ICD-10-CM | POA: Diagnosis not present

## 2020-09-20 DIAGNOSIS — R2689 Other abnormalities of gait and mobility: Secondary | ICD-10-CM | POA: Diagnosis not present

## 2020-09-20 DIAGNOSIS — M6281 Muscle weakness (generalized): Secondary | ICD-10-CM

## 2020-09-20 DIAGNOSIS — R2681 Unsteadiness on feet: Secondary | ICD-10-CM

## 2020-09-20 DIAGNOSIS — G8929 Other chronic pain: Secondary | ICD-10-CM | POA: Diagnosis not present

## 2020-09-20 DIAGNOSIS — R42 Dizziness and giddiness: Secondary | ICD-10-CM | POA: Diagnosis not present

## 2020-09-20 DIAGNOSIS — R296 Repeated falls: Secondary | ICD-10-CM

## 2020-09-20 DIAGNOSIS — M545 Low back pain, unspecified: Secondary | ICD-10-CM

## 2020-09-20 NOTE — Therapy (Signed)
[facility] [address]  [place], [place], [address] Phone: [phone]   Fax:  [phone]  Physical Therapy Treatment  Patient Details  Name: [name] MRN: [id number] Date of Birth: 06-05-1946 Referring Provider (PT): [name], MD   Encounter Date: 09/20/2020   PT End of Session - 09/20/20 1105    Visit Number 21    Number of Visits 28    Date for PT Re-Evaluation 10/14/20    Authorization Type [employer]    PT Start Time 1058    PT Stop Time 1150    PT Time Calculation (min) 52 min    Activity Tolerance Patient tolerated treatment well    Behavior During Therapy [facility] for tasks assessed/performed           Past Medical History:  Diagnosis Date  . Allergy   . Anemia   . Anxiety    past hx   . Arthritis   . Asthma   . Atrial flutter ([place])    past history- not current  . CAD (coronary artery disease) CARDIOLOGIST--  DR Angelena Form   mild non-obstructive cad  . Cancer ([place])    right  . Cataract    bilaterally removed   . Chronic constipation   . Chronic kidney disease    interstitial cystitis  . Concussion    x 3  . COPD (chronic obstructive pulmonary disease) ([place])   . Depression    past hx   . Family history of malignant hyperthermia    father had this  . Fibromyalgia   . Frequency of urination   . GERD (gastroesophageal reflux disease)   . H/O hiatal hernia   . History of basal cell carcinoma excision    X2  . History of breast cancer ONCOLOGIST-- DR Jana Hakim---  NO RECURRANCE   DX 07/2012;  LOW GRADE DCIS  ER+PR+  ----  S/P RIGHT LUMPECTOMY WITH NEGATIVE MARGINS/   RADIATION ENDED 11/2012  . History of chronic bronchitis   . History of colonic polyps   . History of tachycardia    CONTROLLED  WITH ATENOLOL  . Hyperlipidemia   . Hypertension   . Neuromuscular disorder (HCC)    fibromyalgia  . Osteoporosis 01/2019   T score -2.2 stable/improved from prior study  . Pelvic  pain   . Personal history of radiation therapy   . S/P radiation therapy 11/12/12 - 12/05/12   right Breast  . Sepsis ([place]) 2014   from UTI   . Sinus headache   . Urgency of urination     Past Surgical History:  Procedure Laterality Date  . [place] STUDY N/A 04/03/2016   Procedure: [place] STUDY;  Surgeon: Manus Gunning, MD;  Location: [facility];  Service: Gastroenterology;  Laterality: N/A;  . BREAST LUMPECTOMY Right 10-11-2012   W/ SLN BX  . CARDIAC CATHETERIZATION  09-13-2007  DR Lia Foyer   WELL-PRESERVED LVF/  DIFFUSE SCATTERED CORONARY CALCIFACATION AND ATHEROSCLEROSIS WITHOUT OBSTRUCTION  . CARDIAC CATHETERIZATION  08-04-2010  DR Angelena Form   NON-OBSTRUCTIVE CAD/  pLAD 40%/  oLAD 30%/  mLAD 30%/  pRCA 30%/  EF 60%  . CARDIOVASCULAR STRESS TEST  06-18-2012  DR McALHANY   LOW RISK NUCLEAR STUDY/  SMALL FIXED AREA OF MODERATELY DECREASED UPTAKE IN ANTEROSEPTAL WALL WHICH MAY BE ARTIFACTUAL/  NO ISCHEMIA/  EF 68%  . COLONOSCOPY  09-29-2010  . CYSTOSCOPY    . CYSTOSCOPY WITH HYDRODISTENSION  AND BIOPSY N/A 03/06/2014   Procedure: CYSTOSCOPY/HYDRODISTENSION/ INSTILATION OF MARCAINE AND PYRIDIUM;  Surgeon: Ailene Rud, MD;  Location: [facility];  Service: Urology;  Laterality: N/A;  . ESOPHAGEAL MANOMETRY N/A 04/03/2016   Procedure: ESOPHAGEAL MANOMETRY (EM);  Surgeon: Manus Gunning, MD;  Location: [facility];  Service: Gastroenterology;  Laterality: N/A;  . EXTRACORPOREAL SHOCK WAVE LITHOTRIPSY Left 02/06/2019   Procedure: EXTRACORPOREAL SHOCK WAVE LITHOTRIPSY (ESWL);  Surgeon: Kathie Rhodes, MD;  Location: [facility];  Service: Urology;  Laterality: Left;  . NASAL SINUS SURGERY  1985  . ORIF RIGHT ANKLE  FX  2006  . POLYPECTOMY    . REMOVAL VOCAL CORD CYST  FEB 2014  . RIGHT BREAST BX  08-23-2012  . RIGHT HAND SURGERY  X3  LAST ONE 2009   INCLUDES  ORIF RIGHT 5TH FINGER AND REVISION TWICE  . SKIN CANCER EXCISION    . TONSILLECTOMY AND  ADENOIDECTOMY  AGE 70  . TOTAL ABDOMINAL HYSTERECTOMY W/ BILATERAL SALPINGOOPHORECTOMY  1982   W/  APPENDECTOMY  . TRANSTHORACIC ECHOCARDIOGRAM  06-24-2012   GRADE I DIASTOLIC DYSFUNCTION/  EF 55-60%/  MILD MR  . UPPER GASTROINTESTINAL ENDOSCOPY      There were no vitals filed for this visit.   Subjective Assessment - 09/20/20 1103    Subjective Pt. noting L glute and neck pain today which has had good and bad days since last visit however pt. noted some relief after last session MT.    Pertinent History Recurrent falls - 3 in past 6 months, 7 major falls in past 5-6 yrs (4 concussions, finger fracture); costochondritis, cellulitis of R breast, fibromyalgia, chronic LBP since ~age 77, DDD, sciatica, osteoporosis, degenerative scoliosis, peripheral neuropathy, vertigo, R knee pain, CAD, COPD, HTN, h/o lumpectomy for R breast cancer 2013, ORIF R ankle s/p fall 2006    Patient Stated Goals "Be able to gain some strength in my legs to help with my back. To be able stand w/o feeling like I'm swaying and walk w/o tripping."    Currently in Pain? Yes    Pain Score 4     Pain Location Buttocks    Pain Orientation Left    Pain Descriptors / Indicators Stabbing    Pain Type Acute pain;Chronic pain    Pain Frequency Constant    Aggravating Factors  prolonged standing and walking    Pain Relieving Factors rest    Multiple Pain Sites No    Pain Score 5    Pain Location Neck    Pain Orientation Right    Pain Descriptors / Indicators Sore    Pain Type Acute pain    Pain Radiating Towards into upper shoulder    Pain Frequency Constant    Aggravating Factors  pain into head and into R upper shoulder                             [facility] Adult PT Treatment/Exercise - 09/20/20 0001      Neuro Re-ed    Neuro Re-ed Details  With therapist CGA: B tandem stance balance 3 x 10 sec, B SLS balance 3 x 10 sec; alternating cone knock over/righting 2 x 7 cones each LE      Knee/Hip Exercises:  Aerobic   Recumbent Bike L2 x 7 min      Manual Therapy   Manual Therapy Soft tissue mobilization;Myofascial release    Manual therapy comments sidelying and  seated     Soft tissue mobilization STM/DTM to B UT, LS, cervical paraspinals; STM/DTM to L glutes, piriformis     Myofascial Release TPR to R/L UT, TPR to L piriformis, inferior glute max, glute medius                    PT Short Term Goals - 06/22/20 1015      PT SHORT TERM GOAL #1   Title Patient will be independent with initial HEP    Status Achieved   06/22/20     PT SHORT TERM GOAL #2   Title Complete FGA assessment    Status Achieved   06/22/20            PT Long Term Goals - 09/16/20 0920      PT LONG TERM GOAL #1   Title Patient will be independent with ongoing/advanced HEP    Status Partially Met   09/16/20 - met for current HEP   Target Date 10/14/20      PT LONG TERM GOAL #2   Title Patient will demonstrate improved B LE strength to >/= 4/5 to 4+/5 for improved stability and ease of mobility    Status Unable to assess   09/16/20 - MMT deferred d/t severe LBP from recent fall   Target Date 10/14/20      PT LONG TERM GOAL #3   Title Patient will improve Berg score to >/= 52/56 to improve safety stability with ADLs in standing and reduce risk for falls    Status Achieved   09/16/20 Merrilee Jansky = 52/56     PT LONG TERM GOAL #4   Title Patient will report no further instances of falls    Status On-going   09/16/20 - most recent fall on 09/02/20   Target Date 10/14/20      PT LONG TERM GOAL #5   Title Patient will improve FGA score to >/= 24/30 to improve gait stability and reduce risk for falls    Baseline Patient will improve FGA score to >/= 20/30 to improve gait stability and reduce risk for falls - met as of 09/16/20 & revised    Status Revised   09/16/20 - FGA = 20/30   Target Date 10/14/20      PT LONG TERM GOAL #6   Title Patient to report no dizziness remaining with bed mobility.    Status  Achieved   08/24/20                Plan - 09/20/20 1106    Clinical Impression Statement Pt. reporting intermittent L buttocks and R-sided neck pain over these past few days with good improvement in back/hip pain after last session MT.  Does still feel residual "cloudiness" which she attributes to fall/concussion earlier this month.  Continued DTM/TPR to multiple areas in L glutes, piriformis, B UT, LS with reduction in pain and tone noted following MT today.  Duration of session focused on dynamic and static balance training with CGA required from therapist for safety.  [name] leaving session noting improved L buttocks pain and reports she is performing HEP daily.    Comorbidities Recurrent falls - 3 in past 6 months, 7 major falls in past 5-6 yrs (4 concussions, finger fracture); costochondritis, cellulitis of R breast, fibromyalgia, chronic LBP since ~age 3, DDD, sciatica, osteoporosis, degenerative scoliosis, peripheral neuropathy, vertigo, R knee pain, CAD, COPD, HTN, h/o lumpectomy for R breast cancer 2013, ORIF R ankle s/p fall  2006    Rehab Potential Good    PT Frequency 2x / week    PT Duration 4 weeks    PT Treatment/Interventions ADLs/Self Care Home Management;Cryotherapy;Electrical Stimulation;Iontophoresis 5m/ml Dexamethasone;Moist Heat;Ultrasound;DME Instruction;Gait training;Stair training;Functional mobility training;Therapeutic activities;Therapeutic exercise;Balance training;Neuromuscular re-education;Patient/family education;Manual techniques;Passive range of motion;Dry needling;Taping;Vasopneumatic Device;Vestibular;Visual/perceptual remediation/compensation;Spinal Manipulations    PT Next Visit Plan lumbopelvic strengthening and stabilization - HEP review/update PRN; balance training; post-concussion management; manual therapy & modalities for pain including ionto patch and/or taping as benefit noted for B hip/greater trochanter pain; balance training; vestibular reassessment &  treatment as indicated    PT Home Exercise Plan 6/24 - supported squat/eccentric stand>sit with yellow TB hip ABD isometric, seated yellow TB clam & marching; 7/23 - piriformis, ITB, hip flexor & HS stretches; 7/28 - yellow TB hook lying clam shell; 8/18 - chin tuck, UT & SCM stretches, scap retraction; 8/19 - seated horiz & vertical gaze stabilization; 8/31 - corner balance progression    Consulted and Agree with Plan of Care Patient           Patient will benefit from skilled therapeutic intervention in order to improve the following deficits and impairments:  Abnormal gait, Decreased activity tolerance, Decreased balance, Decreased coordination, Decreased endurance, Decreased knowledge of precautions, Decreased mobility, Decreased safety awareness, Decreased strength, Difficulty walking, Dizziness, Increased fascial restricitons, Increased muscle spasms, Impaired perceived functional ability, Impaired flexibility, Improper body mechanics, Postural dysfunction, Pain  Visit Diagnosis: Repeated falls  Unsteadiness on feet  Muscle weakness (generalized)  Other abnormalities of gait and mobility  Dizziness and giddiness  Chronic bilateral low back pain without sciatica  Intractable acute post-traumatic headache     Problem List Patient Active Problem List   Diagnosis Date Noted  . Low back pain 09/08/2020  . OSA and COPD overlap syndrome ([place] 06/10/2020  . Serotonin withdrawal syndrome without complication [id number] . Recurrent falls 05/02/2020  . Statin intolerance 02/29/2020  . RLS (restless legs syndrome) 11/09/2019  . Kidney stone 02/26/2019  . Recurrent sinusitis 02/25/2019  . Hx of colonic polyp 02/25/2019  . DDD (degenerative disc disease), cervical 09/17/2018  . Degenerative scoliosis 04/22/2018  . Primary insomnia 06/25/2017  . Chronic interstitial cystitis 02/01/2017  . Cough 01/09/2017  . Right arm pain 07/20/2016  . Deviated septum 05/12/2016  . Nasal  turbinate hypertrophy 05/12/2016  . Dysphagia   . Allergic rhinitis 01/25/2016  . Other and combined forms of senile cataract 07/15/2015  . Dyspnea   . Left hip pain 04/30/2015  . Sciatica 04/30/2015  . Knee pain, right 04/30/2015  . Bilateral carpal tunnel syndrome 03/04/2015  . Hyperlipidemia 03/01/2015  . Pulmonary nodules 02/12/2015  . External hemorrhoids 02/05/2015  . Chronic night sweats 01/08/2015  . Atypical chest pain 01/01/2015  . Renal insufficiency 07/16/2014  . Memory loss 05/22/2014  . Peripheral neuropathy 05/22/2014  . Abdominal pain 10/26/2013  . Headache 10/13/2013  . Arthralgia 08/18/2013  . ANA positive 07/29/2013  . Depression 02/05/2013  . Family history of malignant hyperthermia 02/05/2013  . Malignant neoplasm of lower-outer quadrant of right breast of female, estrogen receptor positive ([place] 01/03/2013  . Splenic lesion 09/02/2012  . Asthma, allergic 08/05/2012  . GERD (gastroesophageal reflux disease) 06/03/2012  . Edema 04/08/2012  . Stricture and stenosis of esophagus 10/19/2011  . Functional constipation 07/21/2011  . Nonspecific (abnormal) findings on radiological and other examination of biliary tract 07/21/2011  . Osteoporosis 04/17/2011  . Leg pain 02/24/2011  . FIBROCYSTIC BREAST DISEASE, HX OF 10/18/2010  .  Essential hypertension 08/25/2010  . CAD in native artery 08/25/2010  . TOBACCO ABUSE, HX OF 08/25/2010  . GENERALIZED ANXIETY DISORDER 08/03/2010  . Fibromyalgia 01/11/2010    Bess Harvest, PTA 09/20/20 12:10 PM   [facility] [address]  [place], [place], [address] Phone: [phone]   Fax:  [phone]  Name: [name] MRN: [id number] Date of Birth: 1946-08-25

## 2020-09-22 ENCOUNTER — Ambulatory Visit: Payer: Medicare Other

## 2020-09-22 ENCOUNTER — Telehealth: Payer: Self-pay | Admitting: Family Medicine

## 2020-09-22 ENCOUNTER — Other Ambulatory Visit: Payer: Self-pay

## 2020-09-22 ENCOUNTER — Other Ambulatory Visit: Payer: Self-pay | Admitting: Family Medicine

## 2020-09-22 ENCOUNTER — Other Ambulatory Visit: Payer: Self-pay | Admitting: Cardiology

## 2020-09-22 DIAGNOSIS — R42 Dizziness and giddiness: Secondary | ICD-10-CM

## 2020-09-22 DIAGNOSIS — R2681 Unsteadiness on feet: Secondary | ICD-10-CM | POA: Diagnosis not present

## 2020-09-22 DIAGNOSIS — M6281 Muscle weakness (generalized): Secondary | ICD-10-CM

## 2020-09-22 DIAGNOSIS — R2689 Other abnormalities of gait and mobility: Secondary | ICD-10-CM

## 2020-09-22 DIAGNOSIS — R296 Repeated falls: Secondary | ICD-10-CM | POA: Diagnosis not present

## 2020-09-22 DIAGNOSIS — I10 Essential (primary) hypertension: Secondary | ICD-10-CM

## 2020-09-22 DIAGNOSIS — G8929 Other chronic pain: Secondary | ICD-10-CM | POA: Diagnosis not present

## 2020-09-22 DIAGNOSIS — M545 Low back pain: Secondary | ICD-10-CM | POA: Diagnosis not present

## 2020-09-22 DIAGNOSIS — G44311 Acute post-traumatic headache, intractable: Secondary | ICD-10-CM | POA: Diagnosis not present

## 2020-09-22 NOTE — Therapy (Signed)
[facility] Outpatient Rehabilitation [facility] [address]  [address] [place], [place], [address] Phone: [phone]   Fax:  [phone]  Physical Therapy Treatment  Patient Details  Name: [name] MRN: [id number] Date of Birth: 1946-09-04 Referring Provider (PT): Danise Edge, MD   Encounter Date: 09/22/2020   PT End of Session - 09/22/20 1408    Visit Number 22    Number of Visits 28    Date for PT Re-Evaluation 10/14/20    Authorization Type [employer]    PT Start Time 1404    PT Stop Time 1509    PT Time Calculation (min) 65 min    Activity Tolerance Patient tolerated treatment well    Behavior During Therapy [facility] for tasks assessed/performed           Past Medical History:  Diagnosis Date  . Allergy   . Anemia   . Anxiety    past hx   . Arthritis   . Asthma   . Atrial flutter (HCC)    past history- not current  . CAD (coronary artery disease) CARDIOLOGIST--  DR Clifton James   mild non-obstructive cad  . Cancer (HCC)    right  . Cataract    bilaterally removed   . Chronic constipation   . Chronic kidney disease    interstitial cystitis  . Concussion    x 3  . COPD (chronic obstructive pulmonary disease) (HCC)   . Depression    past hx   . Family history of malignant hyperthermia    father had this  . Fibromyalgia   . Frequency of urination   . GERD (gastroesophageal reflux disease)   . H/O hiatal hernia   . History of basal cell carcinoma excision    X2  . History of breast cancer ONCOLOGIST-- DR Darnelle Catalan---  NO RECURRANCE   DX 07/2012;  LOW GRADE DCIS  ER+PR+  ----  S/P RIGHT LUMPECTOMY WITH NEGATIVE MARGINS/   RADIATION ENDED 11/2012  . History of chronic bronchitis   . History of colonic polyps   . History of tachycardia    CONTROLLED  WITH ATENOLOL  . Hyperlipidemia   . Hypertension   . Neuromuscular disorder (HCC)    fibromyalgia  . Osteoporosis 01/2019   T score -2.2 stable/improved from prior study  . Pelvic  pain   . Personal history of radiation therapy   . S/P radiation therapy 11/12/12 - 12/05/12   right Breast  . Sepsis (HCC) 2014   from UTI   . Sinus headache   . Urgency of urination     Past Surgical History:  Procedure Laterality Date  . 24 HOUR PH STUDY N/A 04/03/2016   Procedure: 24 HOUR PH STUDY;  Surgeon: Ruffin Frederick, MD;  Location: [facility];  Service: Gastroenterology;  Laterality: N/A;  . BREAST LUMPECTOMY Right 10-11-2012   W/ SLN BX  . CARDIAC CATHETERIZATION  09-13-2007  DR Riley Kill   WELL-PRESERVED LVF/  DIFFUSE SCATTERED CORONARY CALCIFACATION AND ATHEROSCLEROSIS WITHOUT OBSTRUCTION  . CARDIAC CATHETERIZATION  08-04-2010  DR Clifton James   NON-OBSTRUCTIVE CAD/  pLAD 40%/  oLAD 30%/  mLAD 30%/  pRCA 30%/  EF 60%  . CARDIOVASCULAR STRESS TEST  06-18-2012  DR McALHANY   LOW RISK NUCLEAR STUDY/  SMALL FIXED AREA OF MODERATELY DECREASED UPTAKE IN ANTEROSEPTAL WALL WHICH MAY BE ARTIFACTUAL/  NO ISCHEMIA/  EF 68%  . COLONOSCOPY  09-29-2010  . CYSTOSCOPY    . CYSTOSCOPY WITH HYDRODISTENSION  AND BIOPSY N/A 03/06/2014   Procedure: CYSTOSCOPY/HYDRODISTENSION/ INSTILATION OF MARCAINE AND PYRIDIUM;  Surgeon: Ailene Rud, MD;  Location: [facility];  Service: Urology;  Laterality: N/A;  . ESOPHAGEAL MANOMETRY N/A 04/03/2016   Procedure: ESOPHAGEAL MANOMETRY (EM);  Surgeon: Manus Gunning, MD;  Location: [facility];  Service: Gastroenterology;  Laterality: N/A;  . EXTRACORPOREAL SHOCK WAVE LITHOTRIPSY Left 02/06/2019   Procedure: EXTRACORPOREAL SHOCK WAVE LITHOTRIPSY (ESWL);  Surgeon: Kathie Rhodes, MD;  Location: [facility];  Service: Urology;  Laterality: Left;  . NASAL SINUS SURGERY  1985  . ORIF RIGHT ANKLE  FX  2006  . POLYPECTOMY    . REMOVAL VOCAL CORD CYST  FEB 2014  . RIGHT BREAST BX  08-23-2012  . RIGHT HAND SURGERY  X3  LAST ONE 2009   INCLUDES  ORIF RIGHT 5TH FINGER AND REVISION TWICE  . SKIN CANCER EXCISION    . TONSILLECTOMY AND  ADENOIDECTOMY  AGE 43  . TOTAL ABDOMINAL HYSTERECTOMY W/ BILATERAL SALPINGOOPHORECTOMY  1982   W/  APPENDECTOMY  . TRANSTHORACIC ECHOCARDIOGRAM  06-24-2012   GRADE I DIASTOLIC DYSFUNCTION/  EF 55-60%/  MILD MR  . UPPER GASTROINTESTINAL ENDOSCOPY      There were no vitals filed for this visit.   Subjective Assessment - 09/22/20 1409    Subjective Pt. noting increased LBP today    Pertinent History Recurrent falls - 3 in past 6 months, 7 major falls in past 5-6 yrs (4 concussions, finger fracture); costochondritis, cellulitis of R breast, fibromyalgia, chronic LBP since ~age 21, DDD, sciatica, osteoporosis, degenerative scoliosis, peripheral neuropathy, vertigo, R knee pain, CAD, COPD, HTN, h/o lumpectomy for R breast cancer 2013, ORIF R ankle s/p fall 2006    Patient Stated Goals "Be able to gain some strength in my legs to help with my back. To be able stand w/o feeling like I'm swaying and walk w/o tripping."    Currently in Pain? Yes    Pain Score 7     Pain Location Back    Pain Orientation Left;Right                             [facility] Adult PT Treatment/Exercise - 09/22/20 0001      Knee/Hip Exercises: Aerobic   Nustep Lvl 4, 6 min (UE/LE)      Knee/Hip Exercises: Supine   Bridges Both;10 reps;2 sets;Strengthening    Bridges Limitations 3" hold     Other Supine Knee/Hip Exercises Hooklying alteranting red TB clam shell x 10      Moist Heat Therapy   Number Minutes Moist Heat 15 Minutes    Moist Heat Location Lumbar Spine   and B buttocks      Electrical Stimulation   Electrical Stimulation Location R-buttocks     Electrical Stimulation Action IFC    Electrical Stimulation Parameters 80-150Hz , intensity to pt., 15'    Electrical Stimulation Goals Pain;Tone      Manual Therapy   Manual Therapy Muscle Energy Technique;Soft tissue mobilization;Myofascial release    Manual therapy comments sidelying      Soft tissue mobilization STM/DTM to B buttocks,  piriformis, glute medius     Myofascial Release TPR to R/L glute med, R/L piriformis, R/L superior glute max    Muscle Energy Technique MET for correction of SIJ alignment d/t apparent R posterior innominate rotation of pelvis on sacrum - alignment appreared normalized following MET  PT Short Term Goals - 06/22/20 1015      PT SHORT TERM GOAL #1   Title Patient will be independent with initial HEP    Status Achieved   06/22/20     PT SHORT TERM GOAL #2   Title Complete FGA assessment    Status Achieved   06/22/20            PT Long Term Goals - 09/16/20 0920      PT LONG TERM GOAL #1   Title Patient will be independent with ongoing/advanced HEP    Status Partially Met   09/16/20 - met for current HEP   Target Date 10/14/20      PT LONG TERM GOAL #2   Title Patient will demonstrate improved B LE strength to >/= 4/5 to 4+/5 for improved stability and ease of mobility    Status Unable to assess   09/16/20 - MMT deferred d/t severe LBP from recent fall   Target Date 10/14/20      PT LONG TERM GOAL #3   Title Patient will improve Berg score to >/= 52/56 to improve safety stability with ADLs in standing and reduce risk for falls    Status Achieved   09/16/20 Merrilee Jansky = 52/56     PT LONG TERM GOAL #4   Title Patient will report no further instances of falls    Status On-going   09/16/20 - most recent fall on 09/02/20   Target Date 10/14/20      PT LONG TERM GOAL #5   Title Patient will improve FGA score to >/= 24/30 to improve gait stability and reduce risk for falls    Baseline Patient will improve FGA score to >/= 20/30 to improve gait stability and reduce risk for falls - met as of 09/16/20 & revised    Status Revised   09/16/20 - FGA = 20/30   Target Date 10/14/20      PT LONG TERM GOAL #6   Title Patient to report no dizziness remaining with bed mobility.    Status Achieved   08/24/20                Plan - 09/22/20 1411    Clinical Impression  Statement [name] reporting increased LBP without known trigger when intensified this morning. Did present with complaint of R-sided LBP local to R SIJ painful with palpation.  DTM/TPR performed to B buttocks, piriformis, glute Medius with improved tissue quality and comfort noted following this.  MET used to correct apparent R innominate rotation of pelvis on sacrum with normalized SIJ positioning noted following correction.  Duration of session focused on proximal hip strengthening to glutes to pt. tolerance and ended session with moist heat/E-stim to lumbar spine/buttocks.  Pt. leaving session with notable improvement in posture, gait pattern and noting she was pain free.    Comorbidities Recurrent falls - 3 in past 6 months, 7 major falls in past 5-6 yrs (4 concussions, finger fracture); costochondritis, cellulitis of R breast, fibromyalgia, chronic LBP since ~age 31, DDD, sciatica, osteoporosis, degenerative scoliosis, peripheral neuropathy, vertigo, R knee pain, CAD, COPD, HTN, h/o lumpectomy for R breast cancer 2013, ORIF R ankle s/p fall 2006    Rehab Potential Good    PT Frequency 2x / week    PT Duration 4 weeks    PT Treatment/Interventions ADLs/Self Care Home Management;Cryotherapy;Electrical Stimulation;Iontophoresis 4mg /ml Dexamethasone;Moist Heat;Ultrasound;DME Instruction;Gait training;Stair training;Functional mobility training;Therapeutic activities;Therapeutic exercise;Balance training;Neuromuscular re-education;Patient/family education;Manual techniques;Passive range of motion;Dry needling;Taping;Vasopneumatic  Device;Vestibular;Visual/perceptual remediation/compensation;Spinal Manipulations    PT Next Visit Plan lumbopelvic strengthening and stabilization - HEP review/update PRN; balance training; post-concussion management; manual therapy & modalities for pain including ionto patch and/or taping as benefit noted for B hip/greater trochanter pain; balance training; vestibular reassessment &  treatment as indicated    PT Home Exercise Plan 6/24 - supported squat/eccentric stand>sit with yellow TB hip ABD isometric, seated yellow TB clam & marching; 7/23 - piriformis, ITB, hip flexor & HS stretches; 7/28 - yellow TB hook lying clam shell; 8/18 - chin tuck, UT & SCM stretches, scap retraction; 8/19 - seated horiz & vertical gaze stabilization; 8/31 - corner balance progression    Consulted and Agree with Plan of Care Patient           Patient will benefit from skilled therapeutic intervention in order to improve the following deficits and impairments:  Abnormal gait, Decreased activity tolerance, Decreased balance, Decreased coordination, Decreased endurance, Decreased knowledge of precautions, Decreased mobility, Decreased safety awareness, Decreased strength, Difficulty walking, Dizziness, Increased fascial restricitons, Increased muscle spasms, Impaired perceived functional ability, Impaired flexibility, Improper body mechanics, Postural dysfunction, Pain  Visit Diagnosis: Repeated falls  Unsteadiness on feet  Muscle weakness (generalized)  Other abnormalities of gait and mobility  Dizziness and giddiness  Chronic bilateral low back pain without sciatica     Problem List Patient Active Problem List   Diagnosis Date Noted  . Low back pain 09/08/2020  . OSA and COPD overlap syndrome ([place]) 06/10/2020  . Serotonin withdrawal syndrome without complication 16/09/9603  . Recurrent falls 05/02/2020  . Statin intolerance 02/29/2020  . RLS (restless legs syndrome) 11/09/2019  . Kidney stone 02/26/2019  . Recurrent sinusitis 02/25/2019  . Hx of colonic polyp 02/25/2019  . DDD (degenerative disc disease), cervical 09/17/2018  . Degenerative scoliosis 04/22/2018  . Primary insomnia 06/25/2017  . Chronic interstitial cystitis 02/01/2017  . Cough 01/09/2017  . Right arm pain 07/20/2016  . Deviated septum 05/12/2016  . Nasal turbinate hypertrophy 05/12/2016  . Dysphagia     . Allergic rhinitis 01/25/2016  . Other and combined forms of senile cataract 07/15/2015  . Dyspnea   . Left hip pain 04/30/2015  . Sciatica 04/30/2015  . Knee pain, right 04/30/2015  . Bilateral carpal tunnel syndrome 03/04/2015  . Hyperlipidemia 03/01/2015  . Pulmonary nodules 02/12/2015  . External hemorrhoids 02/05/2015  . Chronic night sweats 01/08/2015  . Atypical chest pain 01/01/2015  . Renal insufficiency 07/16/2014  . Memory loss 05/22/2014  . Peripheral neuropathy 05/22/2014  . Abdominal pain 10/26/2013  . Headache 10/13/2013  . Arthralgia 08/18/2013  . ANA positive 07/29/2013  . Depression 02/05/2013  . Family history of malignant hyperthermia 02/05/2013  . Malignant neoplasm of lower-outer quadrant of right breast of female, estrogen receptor positive ([place]) 01/03/2013  . Splenic lesion 09/02/2012  . Asthma, allergic 08/05/2012  . GERD (gastroesophageal reflux disease) 06/03/2012  . Edema 04/08/2012  . Stricture and stenosis of esophagus 10/19/2011  . Functional constipation 07/21/2011  . Nonspecific (abnormal) findings on radiological and other examination of biliary tract 07/21/2011  . Osteoporosis 04/17/2011  . Leg pain 02/24/2011  . FIBROCYSTIC BREAST DISEASE, HX OF 10/18/2010  . Essential hypertension 08/25/2010  . CAD in native artery 08/25/2010  . TOBACCO ABUSE, HX OF 08/25/2010  . GENERALIZED ANXIETY DISORDER 08/03/2010  . Fibromyalgia 01/11/2010    [name], [name] 09/22/20 3:49 PM   [facility] [address]  [address] [place]  [place], [place], [address] Phone: [phone]   Fax:  [phone]  Name: [name] MRN: [id number] Date of Birth: 1946/10/05

## 2020-09-22 NOTE — Telephone Encounter (Signed)
Pt left voicemail requesting an appointment for a f/u to a fall she had 10 days ago.  Pt was called back to inform she would have to speak with billing about her bad debt balance with [employer] and how she would need to speak with billing before scheduling an appointment, but her voicemail came on.  Pt was asked to call office re: her request for an appointment, the office # was left (no mention of the bad debt balance mentioned on voicemail).  This is FYI.

## 2020-09-22 NOTE — Telephone Encounter (Signed)
Pt is calling to make an appointment with Dr. Jaynee Eagles only, not agreeing with diagnosis by Amy. Please call.

## 2020-09-23 NOTE — Telephone Encounter (Signed)
Please do not schedule, I will call patient

## 2020-09-24 NOTE — Telephone Encounter (Signed)
I discussed with Dr. Charlett Blake through email, we have seen patient tin the past and addressed multiple concerns and at this time I would prefer if she sees another neurology group to see if another pair of eyes may see something we missed.

## 2020-09-27 ENCOUNTER — Ambulatory Visit: Payer: Medicare Other | Attending: Family Medicine | Admitting: Physical Therapy

## 2020-09-27 ENCOUNTER — Encounter: Payer: Self-pay | Admitting: Physical Therapy

## 2020-09-27 ENCOUNTER — Telehealth: Payer: Self-pay | Admitting: *Deleted

## 2020-09-27 ENCOUNTER — Other Ambulatory Visit: Payer: Self-pay

## 2020-09-27 DIAGNOSIS — R42 Dizziness and giddiness: Secondary | ICD-10-CM | POA: Diagnosis not present

## 2020-09-27 DIAGNOSIS — R2681 Unsteadiness on feet: Secondary | ICD-10-CM | POA: Diagnosis not present

## 2020-09-27 DIAGNOSIS — R296 Repeated falls: Secondary | ICD-10-CM

## 2020-09-27 DIAGNOSIS — G8929 Other chronic pain: Secondary | ICD-10-CM | POA: Diagnosis not present

## 2020-09-27 DIAGNOSIS — M545 Low back pain, unspecified: Secondary | ICD-10-CM

## 2020-09-27 DIAGNOSIS — G44311 Acute post-traumatic headache, intractable: Secondary | ICD-10-CM | POA: Diagnosis not present

## 2020-09-27 DIAGNOSIS — M6281 Muscle weakness (generalized): Secondary | ICD-10-CM | POA: Diagnosis not present

## 2020-09-27 DIAGNOSIS — R2689 Other abnormalities of gait and mobility: Secondary | ICD-10-CM | POA: Diagnosis not present

## 2020-09-27 NOTE — Telephone Encounter (Signed)
Both places manage headaches but [facility] probably more so. Can refer there. Please let her know and place referral

## 2020-09-27 NOTE — Telephone Encounter (Signed)
-----   Message from Mosie Lukes, MD sent at 09/23/2020  7:38 PM EDT ----- Regarding: RE: Patient referral to higher level of care, Neuro at [facility] or [facility] Please check with patient and see if she would like to go to [facility] vs [facility] for neurology referral as the local neurolgy group thinks she needs a higher level of care and second opinion.  ----- Message ----- From: Melvenia Beam, MD Sent: 09/23/2020   5:03 PM EDT To: Mosie Lukes, MD Subject: Patient referral to higher level of care, Ne#  Dr. Charlett Blake, we have been seeing this patient for a few years years. We have done multiple testing. Not sure what else to do for her. Would you be willing to refer patient to [facility] or [facility] neurology? I'm not sure we can help her any further here. Let me know what you think, thanks

## 2020-09-27 NOTE — Telephone Encounter (Signed)
Contacted pt for preferences of Neuro provider and she wasn't sure which would be a better service for her care. She has heard great things about [facility] but would like the providers opinion on the services each facility renders. Was not too familiar with [facility] and Neuro. Please contact via mychart to provide pt with information known about each facility.  Pt reports she is active on mychart.Thanks

## 2020-09-27 NOTE — Therapy (Signed)
[facility] [address]  [place], [place], [address] Phone: [phone]   Fax:  [phone]  Physical Therapy Treatment  Patient Details  Name: [name] MRN: [id number] Date of Birth: 1946/09/11 Referring Provider (PT): [name], MD   Encounter Date: 09/27/2020   PT End of Session - 09/27/20 1352    Visit Number 23    Number of Visits 28    Date for PT Re-Evaluation 10/14/20    Authorization Type [employer]    PT Start Time 1352    PT Stop Time 1444    PT Time Calculation (min) 52 min    Equipment Utilized During Treatment Gait belt    Activity Tolerance Patient tolerated treatment well    Behavior During Therapy WFL for tasks assessed/performed           Past Medical History:  Diagnosis Date  . Allergy   . Anemia   . Anxiety    past hx   . Arthritis   . Asthma   . Atrial flutter ([place])    past history- not current  . CAD (coronary artery disease) CARDIOLOGIST--  DR Angelena Form   mild non-obstructive cad  . Cancer ([place])    right  . Cataract    bilaterally removed   . Chronic constipation   . Chronic kidney disease    interstitial cystitis  . Concussion    x 3  . COPD (chronic obstructive pulmonary disease) ([place])   . Depression    past hx   . Family history of malignant hyperthermia    father had this  . Fibromyalgia   . Frequency of urination   . GERD (gastroesophageal reflux disease)   . H/O hiatal hernia   . History of basal cell carcinoma excision    X2  . History of breast cancer ONCOLOGIST-- DR Jana Hakim---  NO RECURRANCE   DX 07/2012;  LOW GRADE DCIS  ER+PR+  ----  S/P RIGHT LUMPECTOMY WITH NEGATIVE MARGINS/   RADIATION ENDED 11/2012  . History of chronic bronchitis   . History of colonic polyps   . History of tachycardia    CONTROLLED  WITH ATENOLOL  . Hyperlipidemia   . Hypertension   . Neuromuscular disorder (H[facility])    fibromyalgia  . Osteoporosis 01/2019   T score  -2.2 stable/improved from prior study  . Pelvic pain   . Personal history of radiation therapy   . S/P radiation therapy 11/12/12 - 12/05/12   right Breast  . Sepsis ([place]) 2014   from UTI   . Sinus headache   . Urgency of urination     Past Surgical History:  Procedure Laterality Date  . [place] STUDY N/A 04/03/2016   Procedure: [place] STUDY;  Surgeon: Manus Gunning, MD;  Location: [facility];  Service: Gastroenterology;  Laterality: N/A;  . BREAST LUMPECTOMY Right 10-11-2012   W/ SLN BX  . CARDIAC CATHETERIZATION  09-13-2007  DR Lia Foyer   WELL-PRESERVED LVF/  DIFFUSE SCATTERED CORONARY CALCIFACATION AND ATHEROSCLEROSIS WITHOUT OBSTRUCTION  . CARDIAC CATHETERIZATION  08-04-2010  DR Angelena Form   NON-OBSTRUCTIVE CAD/  pLAD 40%/  oLAD 30%/  mLAD 30%/  pRCA 30%/  EF 60%  . CARDIOVASCULAR STRESS TEST  06-18-2012  DR McALHANY   LOW RISK NUCLEAR STUDY/  SMALL FIXED AREA OF MODERATELY DECREASED UPTAKE IN ANTEROSEPTAL WALL WHICH MAY BE ARTIFACTUAL/  NO ISCHEMIA/  EF 68%  . COLONOSCOPY  09-29-2010  .  CYSTOSCOPY    . CYSTOSCOPY WITH HYDRODISTENSION AND BIOPSY N/A 03/06/2014   Procedure: CYSTOSCOPY/HYDRODISTENSION/ INSTILATION OF MARCAINE AND PYRIDIUM;  Surgeon: Ailene Rud, MD;  Location: [facility];  Service: Urology;  Laterality: N/A;  . ESOPHAGEAL MANOMETRY N/A 04/03/2016   Procedure: ESOPHAGEAL MANOMETRY (EM);  Surgeon: Manus Gunning, MD;  Location: [facility];  Service: Gastroenterology;  Laterality: N/A;  . EXTRACORPOREAL SHOCK WAVE LITHOTRIPSY Left 02/06/2019   Procedure: EXTRACORPOREAL SHOCK WAVE LITHOTRIPSY (ESWL);  Surgeon: Kathie Rhodes, MD;  Location: [facility];  Service: Urology;  Laterality: Left;  . NASAL SINUS SURGERY  1985  . ORIF RIGHT ANKLE  FX  2006  . POLYPECTOMY    . REMOVAL VOCAL CORD CYST  FEB 2014  . RIGHT BREAST BX  08-23-2012  . RIGHT HAND SURGERY  X3  LAST ONE 2009   INCLUDES  ORIF RIGHT 5TH FINGER AND REVISION TWICE    . SKIN CANCER EXCISION    . TONSILLECTOMY AND ADENOIDECTOMY  AGE 67  . TOTAL ABDOMINAL HYSTERECTOMY W/ BILATERAL SALPINGOOPHORECTOMY  1982   W/  APPENDECTOMY  . TRANSTHORACIC ECHOCARDIOGRAM  06-24-2012   GRADE I DIASTOLIC DYSFUNCTION/  EF 55-60%/  MILD MR  . UPPER GASTROINTESTINAL ENDOSCOPY      There were no vitals filed for this visit.   Subjective Assessment - 09/27/20 1356    Subjective Pt thinks some of her pain last week may have be related to a fibromyalgia flare-up. States she rested over the weekend and worked on self-STM to her TPs and feels much better. She denies head or neck pain today.    Pertinent History Recurrent falls - 3 in past 6 months, 7 major falls in past 5-6 yrs (4 concussions, finger fracture); costochondritis, cellulitis of R breast, fibromyalgia, chronic LBP since ~age 14, DDD, sciatica, osteoporosis, degenerative scoliosis, peripheral neuropathy, vertigo, R knee pain, CAD, COPD, HTN, h/o lumpectomy for R breast cancer 2013, ORIF R ankle s/p fall 2006    Patient Stated Goals "Be able to gain some strength in my legs to help with my back. To be able stand w/o feeling like I'm swaying and walk w/o tripping."    Currently in Pain? Yes    Pain Score 5     Pain Location Back    Pain Orientation Lower    Pain Type Chronic pain    Pain Radiating Towards none today                             [facility] Adult PT Treatment/Exercise - 09/27/20 1352      Knee/Hip Exercises: Aerobic   Nustep L5 x 6 min (UE/LE)               Balance Exercises - 09/27/20 1352      Balance Exercises: Standing   Standing Eyes Opened Wide (BOA);Foam/compliant surface;Cognitive challenge   bean bag reach & toss x 15 bean bags   Cone Rotation Solid surface;A/P;R/L;Right turn;Left turn   4-square stepping around cone +/- 90 turn   Other Standing Exercises R/L fwd cone knock down & righting + large side step btw cones x 6 cones    Other Standing Exercises Comments Step  & reach to contralateral side (cone atop 36" FR) on solid surface and foam Airex pad x ~10 each side               PT Short Term Goals - 06/22/20 1015  PT SHORT TERM GOAL #1   Title Patient will be independent with initial HEP    Status Achieved   06/22/20     PT SHORT TERM GOAL #2   Title Complete FGA assessment    Status Achieved   06/22/20            PT Long Term Goals - 09/16/20 0920      PT LONG TERM GOAL #1   Title Patient will be independent with ongoing/advanced HEP    Status Partially Met   09/16/20 - met for current HEP   Target Date 10/14/20      PT LONG TERM GOAL #2   Title Patient will demonstrate improved B LE strength to >/= 4/5 to 4+/5 for improved stability and ease of mobility    Status Unable to assess   09/16/20 - MMT deferred d/t severe LBP from recent fall   Target Date 10/14/20      PT LONG TERM GOAL #3   Title Patient will improve Berg score to >/= 52/56 to improve safety stability with ADLs in standing and reduce risk for falls    Status Achieved   09/16/20 [name] = 52/56     PT LONG TERM GOAL #4   Title Patient will report no further instances of falls    Status On-going   09/16/20 - most recent fall on 09/02/20   Target Date 10/14/20      PT LONG TERM GOAL #5   Title Patient will improve FGA score to >/= 24/30 to improve gait stability and reduce risk for falls    Baseline Patient will improve FGA score to >/= 20/30 to improve gait stability and reduce risk for falls - met as of 09/16/20 & revised    Status Revised   09/16/20 - FGA = 20/30   Target Date 10/14/20      PT LONG TERM GOAL #6   Title Patient to report no dizziness remaining with bed mobility.    Status Achieved   08/24/20                Plan - 09/27/20 1444    Clinical Impression Statement [name] reports her head and neck pain is essentially resolved today and her back pain is much better, therefore pt wishing to return to working on balance today. Challenged balance with  stepping activities in multiple directions incorporating turns and reaching activities on firm and soft surfaces as pt has noted that she most frequently loses her balance while changing directions or turning while PT providing CGA with gait belt - pt experiencing intermittent LOB, some self-corrected but PT needing to assist with stabilization/balance correction multiple times.    Comorbidities Recurrent falls - 3 in past 6 months, 7 major falls in past 5-6 yrs (4 concussions, finger fracture); costochondritis, cellulitis of R breast, fibromyalgia, chronic LBP since ~age 58, DDD, sciatica, osteoporosis, degenerative scoliosis, peripheral neuropathy, vertigo, R knee pain, CAD, COPD, HTN, h/o lumpectomy for R breast cancer 2013, ORIF R ankle s/p fall 2006    Rehab Potential Good    PT Frequency 2x / week    PT Duration 4 weeks    PT Treatment/Interventions ADLs/Self Care Home Management;Cryotherapy;Electrical Stimulation;Iontophoresis 4mg /ml Dexamethasone;Moist Heat;Ultrasound;DME Instruction;Gait training;Stair training;Functional mobility training;Therapeutic activities;Therapeutic exercise;Balance training;Neuromuscular re-education;Patient/family education;Manual techniques;Passive range of motion;Dry needling;Taping;Vasopneumatic Device;Vestibular;Visual/perceptual remediation/compensation;Spinal Manipulations    PT Next Visit Plan lumbopelvic strengthening and stabilization - HEP review/update PRN; balance training; post-concussion management; manual therapy & modalities for pain including  ionto patch and/or taping as benefit noted for B hip/greater trochanter pain; balance training; vestibular reassessment & treatment as indicated    PT Home Exercise Plan 6/24 - supported squat/eccentric stand>sit with yellow TB hip ABD isometric, seated yellow TB clam & marching; 7/23 - piriformis, ITB, hip flexor & HS stretches; 7/28 - yellow TB hook lying clam shell; 8/18 - chin tuck, UT & SCM stretches, scap  retraction; 8/19 - seated horiz & vertical gaze stabilization; 8/31 - corner balance progression    Consulted and Agree with Plan of Care Patient           Patient will benefit from skilled therapeutic intervention in order to improve the following deficits and impairments:  Abnormal gait, Decreased activity tolerance, Decreased balance, Decreased coordination, Decreased endurance, Decreased knowledge of precautions, Decreased mobility, Decreased safety awareness, Decreased strength, Difficulty walking, Dizziness, Increased fascial restricitons, Increased muscle spasms, Impaired perceived functional ability, Impaired flexibility, Improper body mechanics, Postural dysfunction, Pain  Visit Diagnosis: Repeated falls  Unsteadiness on feet  Muscle weakness (generalized)  Other abnormalities of gait and mobility  Dizziness and giddiness  Chronic bilateral low back pain without sciatica  Intractable acute post-traumatic headache     Problem List Patient Active Problem List   Diagnosis Date Noted  . Low back pain 09/08/2020  . OSA and COPD overlap syndrome ([place]) 06/10/2020  . Serotonin withdrawal syndrome without complication 50/53/9767  . Recurrent falls 05/02/2020  . Statin intolerance 02/29/2020  . RLS (restless legs syndrome) 11/09/2019  . Kidney stone 02/26/2019  . Recurrent sinusitis 02/25/2019  . Hx of colonic polyp 02/25/2019  . DDD (degenerative disc disease), cervical 09/17/2018  . Degenerative scoliosis 04/22/2018  . Primary insomnia 06/25/2017  . Chronic interstitial cystitis 02/01/2017  . Cough 01/09/2017  . Right arm pain 07/20/2016  . Deviated septum 05/12/2016  . Nasal turbinate hypertrophy 05/12/2016  . Dysphagia   . Allergic rhinitis 01/25/2016  . Other and combined forms of senile cataract 07/15/2015  . Dyspnea   . Left hip pain 04/30/2015  . Sciatica 04/30/2015  . Knee pain, right 04/30/2015  . Bilateral carpal tunnel syndrome 03/04/2015  .  Hyperlipidemia 03/01/2015  . Pulmonary nodules 02/12/2015  . External hemorrhoids 02/05/2015  . Chronic night sweats 01/08/2015  . Atypical chest pain 01/01/2015  . Renal insufficiency 07/16/2014  . Memory loss 05/22/2014  . Peripheral neuropathy 05/22/2014  . Abdominal pain 10/26/2013  . Headache 10/13/2013  . Arthralgia 08/18/2013  . ANA positive 07/29/2013  . Depression 02/05/2013  . Family history of malignant hyperthermia 02/05/2013  . Malignant neoplasm of lower-outer quadrant of right breast of female, estrogen receptor positive ([place]) 01/03/2013  . Splenic lesion 09/02/2012  . Asthma, allergic 08/05/2012  . GERD (gastroesophageal reflux disease) 06/03/2012  . Edema 04/08/2012  . Stricture and stenosis of esophagus 10/19/2011  . Functional constipation 07/21/2011  . Nonspecific (abnormal) findings on radiological and other examination of biliary tract 07/21/2011  . Osteoporosis 04/17/2011  . Leg pain 02/24/2011  . FIBROCYSTIC BREAST DISEASE, HX OF 10/18/2010  . Essential hypertension 08/25/2010  . CAD in native artery 08/25/2010  . TOBACCO ABUSE, HX OF 08/25/2010  . GENERALIZED ANXIETY DISORDER 08/03/2010  . Fibromyalgia 01/11/2010    Percival Spanish, PT, MPT 09/27/2020, 3:54 PM  [facility] [address]  [place], [place], [address] Phone: [phone]   Fax:  [phone]  Name: [name] MRN: [id number] Date of Birth: August 01, 1946

## 2020-09-28 ENCOUNTER — Other Ambulatory Visit: Payer: Self-pay | Admitting: Family Medicine

## 2020-09-28 DIAGNOSIS — R519 Headache, unspecified: Secondary | ICD-10-CM

## 2020-09-28 DIAGNOSIS — R296 Repeated falls: Secondary | ICD-10-CM

## 2020-09-28 DIAGNOSIS — G6289 Other specified polyneuropathies: Secondary | ICD-10-CM

## 2020-09-28 NOTE — Telephone Encounter (Signed)
Put in referral used all 3 diagnosis

## 2020-09-28 NOTE — Telephone Encounter (Signed)
Patient would like to go to [facility], but for the dx would you like to use polyneuropathy and frequent falls along with headaches?

## 2020-09-29 ENCOUNTER — Telehealth: Payer: Self-pay | Admitting: Pharmacist

## 2020-09-29 DIAGNOSIS — M238X1 Other internal derangements of right knee: Secondary | ICD-10-CM | POA: Diagnosis not present

## 2020-09-29 NOTE — Telephone Encounter (Signed)
Patient out of medication requesting sample  Medication Samples have been provided to the patient.  Drug name: Repatha SureClick   Strength: 140mg         Qty: 1  LOT: [id number] Exp.Date: 04/23

## 2020-09-30 ENCOUNTER — Ambulatory Visit: Payer: Medicare Other | Admitting: Physical Therapy

## 2020-10-04 ENCOUNTER — Other Ambulatory Visit: Payer: Self-pay

## 2020-10-04 ENCOUNTER — Ambulatory Visit: Payer: Medicare Other

## 2020-10-04 DIAGNOSIS — G44311 Acute post-traumatic headache, intractable: Secondary | ICD-10-CM

## 2020-10-04 DIAGNOSIS — M6281 Muscle weakness (generalized): Secondary | ICD-10-CM

## 2020-10-04 DIAGNOSIS — G8929 Other chronic pain: Secondary | ICD-10-CM | POA: Diagnosis not present

## 2020-10-04 DIAGNOSIS — M545 Low back pain, unspecified: Secondary | ICD-10-CM | POA: Diagnosis not present

## 2020-10-04 DIAGNOSIS — R2681 Unsteadiness on feet: Secondary | ICD-10-CM | POA: Diagnosis not present

## 2020-10-04 DIAGNOSIS — R2689 Other abnormalities of gait and mobility: Secondary | ICD-10-CM

## 2020-10-04 DIAGNOSIS — R42 Dizziness and giddiness: Secondary | ICD-10-CM

## 2020-10-04 DIAGNOSIS — R296 Repeated falls: Secondary | ICD-10-CM

## 2020-10-04 NOTE — Therapy (Signed)
[facility] [address]  [address] [place], [place], [address] Phone: [phone]   Fax:  [phone]  Physical Therapy Treatment  Patient Details  Name: [name] MRN: [id number] Date of Birth: Dec 13, 1946 Referring Provider (PT): Danise Edge, MD   Encounter Date: 10/04/2020   PT End of Session - 10/04/20 1412    Visit Number 24    Number of Visits 28    Date for PT Re-Evaluation 10/14/20    Authorization Type [employer]    PT Start Time 1403    PT Stop Time 1453   Ended visit with 10 min moist heat   PT Time Calculation (min) 50 min    Equipment Utilized During Treatment --    Activity Tolerance Patient tolerated treatment well    Behavior During Therapy [facility] for tasks assessed/performed           Past Medical History:  Diagnosis Date  . Allergy   . Anemia   . Anxiety    past hx   . Arthritis   . Asthma   . Atrial flutter (HCC)    past history- not current  . CAD (coronary artery disease) CARDIOLOGIST--  DR Clifton James   mild non-obstructive cad  . Cancer (HCC)    right  . Cataract    bilaterally removed   . Chronic constipation   . Chronic kidney disease    interstitial cystitis  . Concussion    x 3  . COPD (chronic obstructive pulmonary disease) (HCC)   . Depression    past hx   . Family history of malignant hyperthermia    father had this  . Fibromyalgia   . Frequency of urination   . GERD (gastroesophageal reflux disease)   . H/O hiatal hernia   . History of basal cell carcinoma excision    X2  . History of breast cancer ONCOLOGIST-- DR Darnelle Catalan---  NO RECURRANCE   DX 07/2012;  LOW GRADE DCIS  ER+PR+  ----  S/P RIGHT LUMPECTOMY WITH NEGATIVE MARGINS/   RADIATION ENDED 11/2012  . History of chronic bronchitis   . History of colonic polyps   . History of tachycardia    CONTROLLED  WITH ATENOLOL  . Hyperlipidemia   . Hypertension   . Neuromuscular disorder (HCC)    fibromyalgia  .  Osteoporosis 01/2019   T score -2.2 stable/improved from prior study  . Pelvic pain   . Personal history of radiation therapy   . S/P radiation therapy 11/12/12 - 12/05/12   right Breast  . Sepsis (HCC) 2014   from UTI   . Sinus headache   . Urgency of urination     Past Surgical History:  Procedure Laterality Date  . 24 HOUR PH STUDY N/A 04/03/2016   Procedure: 24 HOUR PH STUDY;  Surgeon: Ruffin Frederick, MD;  Location: [facility];  Service: Gastroenterology;  Laterality: N/A;  . BREAST LUMPECTOMY Right 10-11-2012   W/ SLN BX  . CARDIAC CATHETERIZATION  09-13-2007  DR Riley Kill   WELL-PRESERVED LVF/  DIFFUSE SCATTERED CORONARY CALCIFACATION AND ATHEROSCLEROSIS WITHOUT OBSTRUCTION  . CARDIAC CATHETERIZATION  08-04-2010  DR Clifton James   NON-OBSTRUCTIVE CAD/  pLAD 40%/  oLAD 30%/  mLAD 30%/  pRCA 30%/  EF 60%  . CARDIOVASCULAR STRESS TEST  06-18-2012  DR McALHANY   LOW RISK NUCLEAR STUDY/  SMALL FIXED AREA OF MODERATELY DECREASED UPTAKE IN ANTEROSEPTAL WALL WHICH MAY BE ARTIFACTUAL/  NO ISCHEMIA/  EF  68%  . COLONOSCOPY  09-29-2010  . CYSTOSCOPY    . CYSTOSCOPY WITH HYDRODISTENSION AND BIOPSY N/A 03/06/2014   Procedure: CYSTOSCOPY/HYDRODISTENSION/ INSTILATION OF MARCAINE AND PYRIDIUM;  Surgeon: Kathi Ludwig, MD;  [facility];  Service: Urology;  Laterality: N/A;  . ESOPHAGEAL MANOMETRY N/A 04/03/2016   Procedure: ESOPHAGEAL MANOMETRY (EM);  Surgeon: Ruffin Frederick, MD;  Location: [facility];  Service: Gastroenterology;  Laterality: N/A;  . EXTRACORPOREAL SHOCK WAVE LITHOTRIPSY Left 02/06/2019   Procedure: EXTRACORPOREAL SHOCK WAVE LITHOTRIPSY (ESWL);  Surgeon: Ihor Gully, MD;  Location: [facility];  Service: Urology;  Laterality: Left;  . NASAL SINUS SURGERY  1985  . ORIF RIGHT ANKLE  FX  2006  . POLYPECTOMY    . REMOVAL VOCAL CORD CYST  FEB 2014  . RIGHT BREAST BX  08-23-2012  . RIGHT HAND SURGERY  X3  LAST ONE 2009   INCLUDES  ORIF RIGHT  5TH FINGER AND REVISION TWICE  . SKIN CANCER EXCISION    . TONSILLECTOMY AND ADENOIDECTOMY  AGE 89  . TOTAL ABDOMINAL HYSTERECTOMY W/ BILATERAL SALPINGOOPHORECTOMY  1982   W/  APPENDECTOMY  . TRANSTHORACIC ECHOCARDIOGRAM  06-24-2012   GRADE I DIASTOLIC DYSFUNCTION/  EF 55-60%/  MILD MR  . UPPER GASTROINTESTINAL ENDOSCOPY      There were no vitals filed for this visit.   Subjective Assessment - 10/04/20 1411    Subjective Pt. with complaint of signficant head ache.    Pertinent History Recurrent falls - 3 in past 6 months, 7 major falls in past 5-6 yrs (4 concussions, finger fracture); costochondritis, cellulitis of R breast, fibromyalgia, chronic LBP since ~age 48, DDD, sciatica, osteoporosis, degenerative scoliosis, peripheral neuropathy, vertigo, R knee pain, CAD, COPD, HTN, h/o lumpectomy for R breast cancer 2013, ORIF R ankle s/p fall 2006    Patient Stated Goals "Be able to gain some strength in my legs to help with my back. To be able stand w/o feeling like I'm swaying and walk w/o tripping."    Currently in Pain? Yes    Pain Score 7     Pain Location Hand    Pain Orientation Lateral;Medial;Posterior    Pain Descriptors / Indicators Aching    Pain Type Chronic pain;Acute pain    Multiple Pain Sites No                             [facility] Adult PT Treatment/Exercise - 10/04/20 0001      Knee/Hip Exercises: Aerobic   Nustep L5 x 7 min (UE/LE)      Knee/Hip Exercises: Standing   Hip Flexion Right;Left;10 reps;Knee straight;Stengthening    Hip Flexion Limitations alternating at counter     Hip Abduction Right;Left;10 reps;Knee straight;Stengthening    Abduction Limitations alternating at counter     Hip Extension Right;Left;10 reps;Knee straight;Stengthening    Extension Limitations alternating at counter       Knee/Hip Exercises: Supine   Bridges Both;Strengthening   x 12   Bridges Limitations 3" hold      Moist Heat Therapy   Number Minutes Moist Heat 10  Minutes    Moist Heat Location Cervical   upper back and cervical spine      Manual Therapy   Manual Therapy Soft tissue mobilization;Myofascial release    Manual therapy comments seated     Soft tissue mobilization STM/DTM to B UT, LS, rhomboids, cervical paraspinals, scalenes     Myofascial  Release TPR to B UT, R scalenes, R LS      Neck Exercises: Stretches   Upper Trapezius Stretch Right;Left;30 seconds;1 rep    Levator Stretch Right;Left;1 rep;30 seconds                    PT Short Term Goals - 06/22/20 1015      PT SHORT TERM GOAL #1   Title Patient will be independent with initial HEP    Status Achieved   06/22/20     PT SHORT TERM GOAL #2   Title Complete FGA assessment    Status Achieved   06/22/20            PT Long Term Goals - 09/16/20 0920      PT LONG TERM GOAL #1   Title Patient will be independent with ongoing/advanced HEP    Status Partially Met   09/16/20 - met for current HEP   Target Date 10/14/20      PT LONG TERM GOAL #2   Title Patient will demonstrate improved B LE strength to >/= 4/5 to 4+/5 for improved stability and ease of mobility    Status Unable to assess   09/16/20 - MMT deferred d/t severe LBP from recent fall   Target Date 10/14/20      PT LONG TERM GOAL #3   Title Patient will improve Berg score to >/= 52/56 to improve safety stability with ADLs in standing and reduce risk for falls    Status Achieved   09/16/20 Merrilee Jansky = 52/56     PT LONG TERM GOAL #4   Title Patient will report no further instances of falls    Status On-going   09/16/20 - most recent fall on 09/02/20   Target Date 10/14/20      PT LONG TERM GOAL #5   Title Patient will improve FGA score to >/= 24/30 to improve gait stability and reduce risk for falls    Baseline Patient will improve FGA score to >/= 20/30 to improve gait stability and reduce risk for falls - met as of 09/16/20 & revised    Status Revised   09/16/20 - FGA = 20/30   Target Date 10/14/20       PT LONG TERM GOAL #6   Title Patient to report no dizziness remaining with bed mobility.    Status Achieved   08/24/20                Plan - 10/04/20 1413    Clinical Impression Statement [name] primary complaint today was "intense" headache that she feels started from tense neck/shoulder muscles.  MT addressed neck/shoulder tension with much relief noted from patient allowing session to pivot focus to standing balance/strengthening activities.  Ended visit with moist heat applied to upper shoulders and cervical spine for continued relief with pt. leaving session reporting she was pain free.    Comorbidities Recurrent falls - 3 in past 6 months, 7 major falls in past 5-6 yrs (4 concussions, finger fracture); costochondritis, cellulitis of R breast, fibromyalgia, chronic LBP since ~age 6, DDD, sciatica, osteoporosis, degenerative scoliosis, peripheral neuropathy, vertigo, R knee pain, CAD, COPD, HTN, h/o lumpectomy for R breast cancer 2013, ORIF R ankle s/p fall 2006    Rehab Potential Good    PT Frequency 2x / week    PT Duration 4 weeks    PT Treatment/Interventions ADLs/Self Care Home Management;Cryotherapy;Electrical Stimulation;Iontophoresis 4mg /ml Dexamethasone;Moist Heat;Ultrasound;DME Instruction;Gait training;Stair training;Functional mobility training;Therapeutic activities;Therapeutic  exercise;Balance training;Neuromuscular re-education;Patient/family education;Manual techniques;Passive range of motion;Dry needling;Taping;Vasopneumatic Device;Vestibular;Visual/perceptual remediation/compensation;Spinal Manipulations    PT Next Visit Plan lumbopelvic strengthening and stabilization - HEP review/update PRN; balance training; post-concussion management; manual therapy & modalities for pain including ionto patch and/or taping as benefit noted for B hip/greater trochanter pain; balance training; vestibular reassessment & treatment as indicated    PT Home Exercise Plan 6/24 - supported  squat/eccentric stand>sit with yellow TB hip ABD isometric, seated yellow TB clam & marching; 7/23 - piriformis, ITB, hip flexor & HS stretches; 7/28 - yellow TB hook lying clam shell; 8/18 - chin tuck, UT & SCM stretches, scap retraction; 8/19 - seated horiz & vertical gaze stabilization; 8/31 - corner balance progression    Consulted and Agree with Plan of Care Patient           Patient will benefit from skilled therapeutic intervention in order to improve the following deficits and impairments:  Abnormal gait, Decreased activity tolerance, Decreased balance, Decreased coordination, Decreased endurance, Decreased knowledge of precautions, Decreased mobility, Decreased safety awareness, Decreased strength, Difficulty walking, Dizziness, Increased fascial restricitons, Increased muscle spasms, Impaired perceived functional ability, Impaired flexibility, Improper body mechanics, Postural dysfunction, Pain  Visit Diagnosis: Repeated falls  Unsteadiness on feet  Muscle weakness (generalized)  Other abnormalities of gait and mobility  Dizziness and giddiness  Chronic bilateral low back pain without sciatica  Intractable acute post-traumatic headache     Problem List Patient Active Problem List   Diagnosis Date Noted  . Low back pain 09/08/2020  . OSA and COPD overlap syndrome ([place]) 06/10/2020  . Serotonin withdrawal syndrome without complication 25/95/6387  . Recurrent falls 05/02/2020  . Statin intolerance 02/29/2020  . RLS (restless legs syndrome) 11/09/2019  . Kidney stone 02/26/2019  . Recurrent sinusitis 02/25/2019  . Hx of colonic polyp 02/25/2019  . DDD (degenerative disc disease), cervical 09/17/2018  . Degenerative scoliosis 04/22/2018  . Primary insomnia 06/25/2017  . Chronic interstitial cystitis 02/01/2017  . Cough 01/09/2017  . Right arm pain 07/20/2016  . Deviated septum 05/12/2016  . Nasal turbinate hypertrophy 05/12/2016  . Dysphagia   . Allergic rhinitis  01/25/2016  . Other and combined forms of senile cataract 07/15/2015  . Dyspnea   . Left hip pain 04/30/2015  . Sciatica 04/30/2015  . Knee pain, right 04/30/2015  . Bilateral carpal tunnel syndrome 03/04/2015  . Hyperlipidemia 03/01/2015  . Pulmonary nodules 02/12/2015  . External hemorrhoids 02/05/2015  . Chronic night sweats 01/08/2015  . Atypical chest pain 01/01/2015  . Renal insufficiency 07/16/2014  . Memory loss 05/22/2014  . Peripheral neuropathy 05/22/2014  . Abdominal pain 10/26/2013  . Headache 10/13/2013  . Arthralgia 08/18/2013  . ANA positive 07/29/2013  . Depression 02/05/2013  . Family history of malignant hyperthermia 02/05/2013  . Malignant neoplasm of lower-outer quadrant of right breast of female, estrogen receptor positive ([place]) 01/03/2013  . Splenic lesion 09/02/2012  . Asthma, allergic 08/05/2012  . GERD (gastroesophageal reflux disease) 06/03/2012  . Edema 04/08/2012  . Stricture and stenosis of esophagus 10/19/2011  . Functional constipation 07/21/2011  . Nonspecific (abnormal) findings on radiological and other examination of biliary tract 07/21/2011  . Osteoporosis 04/17/2011  . Leg pain 02/24/2011  . FIBROCYSTIC BREAST DISEASE, HX OF 10/18/2010  . Essential hypertension 08/25/2010  . CAD in native artery 08/25/2010  . TOBACCO ABUSE, HX OF 08/25/2010  . GENERALIZED ANXIETY DISORDER 08/03/2010  . Fibromyalgia 01/11/2010    Bess Harvest, PTA 10/04/20 6:18 PM   [place]  [facility] [address]  [place], [place], [address] Phone: [phone]   Fax:  [phone]  Name: [name] MRN: [id number] Date of Birth: 09/05/1946

## 2020-10-06 ENCOUNTER — Other Ambulatory Visit: Payer: Self-pay | Admitting: Family Medicine

## 2020-10-07 ENCOUNTER — Other Ambulatory Visit: Payer: Self-pay

## 2020-10-07 ENCOUNTER — Ambulatory Visit: Payer: Medicare Other | Admitting: Physical Therapy

## 2020-10-07 DIAGNOSIS — M545 Low back pain, unspecified: Secondary | ICD-10-CM | POA: Diagnosis not present

## 2020-10-07 DIAGNOSIS — M6281 Muscle weakness (generalized): Secondary | ICD-10-CM | POA: Diagnosis not present

## 2020-10-07 DIAGNOSIS — R296 Repeated falls: Secondary | ICD-10-CM | POA: Diagnosis not present

## 2020-10-07 DIAGNOSIS — R42 Dizziness and giddiness: Secondary | ICD-10-CM

## 2020-10-07 DIAGNOSIS — R2681 Unsteadiness on feet: Secondary | ICD-10-CM

## 2020-10-07 DIAGNOSIS — G44311 Acute post-traumatic headache, intractable: Secondary | ICD-10-CM

## 2020-10-07 DIAGNOSIS — G8929 Other chronic pain: Secondary | ICD-10-CM | POA: Diagnosis not present

## 2020-10-07 DIAGNOSIS — R2689 Other abnormalities of gait and mobility: Secondary | ICD-10-CM

## 2020-10-07 NOTE — Therapy (Signed)
[facility] [address]  [place], [place], [address] Phone: [phone]   Fax:  [phone]  Physical Therapy Treatment  Patient Details  Name: [name] MRN: [id number] Date of Birth: 1946-07-05 Referring Provider (PT): [name], MD   Encounter Date: 10/07/2020   PT End of Session - 10/07/20 1314    Visit Number 25    Number of Visits 28    Date for PT Re-Evaluation 10/14/20    Authorization Type [employer]    PT Start Time 1314    PT Stop Time 1401    PT Time Calculation (min) 47 min    Activity Tolerance Patient tolerated treatment well    Behavior During Therapy [facility] for tasks assessed/performed           Past Medical History:  Diagnosis Date  . Allergy   . Anemia   . Anxiety    past hx   . Arthritis   . Asthma   . Atrial flutter ([place])    past history- not current  . CAD (coronary artery disease) CARDIOLOGIST--  DR Angelena Form   mild non-obstructive cad  . Cancer ([place])    right  . Cataract    bilaterally removed   . Chronic constipation   . Chronic kidney disease    interstitial cystitis  . Concussion    x 3  . COPD (chronic obstructive pulmonary disease) ([place])   . Depression    past hx   . Family history of malignant hyperthermia    father had this  . Fibromyalgia   . Frequency of urination   . GERD (gastroesophageal reflux disease)   . H/O hiatal hernia   . History of basal cell carcinoma excision    X2  . History of breast cancer ONCOLOGIST-- DR Jana Hakim---  NO RECURRANCE   DX 07/2012;  LOW GRADE DCIS  ER+PR+  ----  S/P RIGHT LUMPECTOMY WITH NEGATIVE MARGINS/   RADIATION ENDED 11/2012  . History of chronic bronchitis   . History of colonic polyps   . History of tachycardia    CONTROLLED  WITH ATENOLOL  . Hyperlipidemia   . Hypertension   . Neuromuscular disorder (H[facility])    fibromyalgia  . Osteoporosis 01/2019   T score -2.2 stable/improved from prior study  . Pelvic  pain   . Personal history of radiation therapy   . S/P radiation therapy 11/12/12 - 12/05/12   right Breast  . Sepsis ([place]) 2014   from UTI   . Sinus headache   . Urgency of urination     Past Surgical History:  Procedure Laterality Date  . [place] STUDY N/A 04/03/2016   Procedure: [place] STUDY;  Surgeon: Manus Gunning, MD;  Location: [facility];  Service: Gastroenterology;  Laterality: N/A;  . BREAST LUMPECTOMY Right 10-11-2012   W/ SLN BX  . CARDIAC CATHETERIZATION  09-13-2007  DR Lia Foyer   WELL-PRESERVED LVF/  DIFFUSE SCATTERED CORONARY CALCIFACATION AND ATHEROSCLEROSIS WITHOUT OBSTRUCTION  . CARDIAC CATHETERIZATION  08-04-2010  DR Angelena Form   NON-OBSTRUCTIVE CAD/  pLAD 40%/  oLAD 30%/  mLAD 30%/  pRCA 30%/  EF 60%  . CARDIOVASCULAR STRESS TEST  06-18-2012  DR McALHANY   LOW RISK NUCLEAR STUDY/  SMALL FIXED AREA OF MODERATELY DECREASED UPTAKE IN ANTEROSEPTAL WALL WHICH MAY BE ARTIFACTUAL/  NO ISCHEMIA/  EF 68%  . COLONOSCOPY  09-29-2010  . CYSTOSCOPY    . CYSTOSCOPY WITH HYDRODISTENSION  AND BIOPSY N/A 03/06/2014   Procedure: CYSTOSCOPY/HYDRODISTENSION/ INSTILATION OF MARCAINE AND PYRIDIUM;  Surgeon: Ailene Rud, MD;  Location: [facility];  Service: Urology;  Laterality: N/A;  . ESOPHAGEAL MANOMETRY N/A 04/03/2016   Procedure: ESOPHAGEAL MANOMETRY (EM);  Surgeon: Manus Gunning, MD;  Location: [facility];  Service: Gastroenterology;  Laterality: N/A;  . EXTRACORPOREAL SHOCK WAVE LITHOTRIPSY Left 02/06/2019   Procedure: EXTRACORPOREAL SHOCK WAVE LITHOTRIPSY (ESWL);  Surgeon: Kathie Rhodes, MD;  Location: [facility];  Service: Urology;  Laterality: Left;  . NASAL SINUS SURGERY  1985  . ORIF RIGHT ANKLE  FX  2006  . POLYPECTOMY    . REMOVAL VOCAL CORD CYST  FEB 2014  . RIGHT BREAST BX  08-23-2012  . RIGHT HAND SURGERY  X3  LAST ONE 2009   INCLUDES  ORIF RIGHT 5TH FINGER AND REVISION TWICE  . SKIN CANCER EXCISION    . TONSILLECTOMY AND  ADENOIDECTOMY  AGE 20  . TOTAL ABDOMINAL HYSTERECTOMY W/ BILATERAL SALPINGOOPHORECTOMY  1982   W/  APPENDECTOMY  . TRANSTHORACIC ECHOCARDIOGRAM  06-24-2012   GRADE I DIASTOLIC DYSFUNCTION/  EF 55-60%/  MILD MR  . UPPER GASTROINTESTINAL ENDOSCOPY      There were no vitals filed for this visit.   Subjective Assessment - 10/07/20 1319    Subjective Pt reports MT last visit helped significantly with her headache but now has pain in L upper arm.    Pertinent History Recurrent falls - 3 in past 6 months, 7 major falls in past 5-6 yrs (4 concussions, finger fracture); costochondritis, cellulitis of R breast, fibromyalgia, chronic LBP since ~age 31, DDD, sciatica, osteoporosis, degenerative scoliosis, peripheral neuropathy, vertigo, R knee pain, CAD, COPD, HTN, h/o lumpectomy for R breast cancer 2013, ORIF R ankle s/p fall 2006    Patient Stated Goals "Be able to gain some strength in my legs to help with my back. To be able stand w/o feeling like I'm swaying and walk w/o tripping."    Currently in Pain? Yes    Pain Score 6    5-6/10   Pain Location Arm    Pain Orientation Left;Upper    Pain Type Acute pain    Pain Frequency Intermittent    Aggravating Factors  picking up things with L hand                             [facility] Adult PT Treatment/Exercise - 10/07/20 1314      High Level Balance   High Level Balance Activities Head turns;Direction changes;Figure 8 turns    High Level Balance Comments 4-square stepping around cone +/- 90 turn, figure 8 with fwd gaze matching motion & fixed gaze with fwd/lateral/backward stepping       Knee/Hip Exercises: Aerobic   Nustep L5 x 6 min (UE/LE)      Shoulder Exercises: 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 Yes  Muscles Treated Upper Quadrant Deltoid;Biceps;Coracobrachialis   Lt   Deltoid Response Twitch response elicited;Palpable increased muscle length    Biceps Response Twitch response elicited;Palpable increased muscle length    Coracobrachialis Response Twitch response elicited;Palpable increased muscle length                  PT Short Term Goals - 06/22/20 1015      PT SHORT TERM GOAL #1   Title Patient will be independent with initial HEP    Status Achieved   06/22/20     PT SHORT TERM GOAL #2   Title Complete FGA assessment    Status Achieved   06/22/20            PT Long Term Goals - 09/16/20 0920      PT LONG TERM GOAL #1   Title Patient will be independent with ongoing/advanced HEP    Status Partially Met   09/16/20 - met for current HEP   Target Date 10/14/20      PT LONG TERM GOAL #2   Title Patient will demonstrate improved B LE strength to >/= 4/5 to 4+/5 for improved stability and ease of mobility    Status Unable to assess   09/16/20 - MMT deferred d/t severe LBP from recent fall   Target Date 10/14/20      PT LONG TERM GOAL #3   Title Patient will improve Berg score to >/= 52/56 to improve safety stability with ADLs in standing and reduce risk for falls    Status Achieved   09/16/20 Merrilee Jansky = 52/56     PT LONG TERM GOAL #4   Title Patient will report no further instances of falls    Status On-going   09/16/20 - most recent fall on 09/02/20   Target Date 10/14/20      PT LONG TERM GOAL #5   Title Patient will improve FGA score to >/= 24/30 to improve gait stability and reduce risk for falls    Baseline Patient will improve FGA score to >/= 20/30 to improve gait stability and reduce risk for falls - met as of  09/16/20 & revised    Status Revised   09/16/20 - FGA = 20/30   Target Date 10/14/20      PT LONG TERM GOAL #6   Title Patient to report no dizziness remaining with bed mobility.    Status Achieved   08/24/20                Plan - 10/07/20 1346    Clinical Impression Statement Dewanna reports headache resolved following MT last visit but now noting L upper arm pain w/o unknown trigger. Multiple tender taut bands/TPs noted in L anterior deltoid, biceps and coracobrachialis which were addressed with MT incorporating DN with good twitch responses elicited resulting in palpable reduction in muscle tension and ttp. Pt instructed in stretch for anterior shoulder/upper arm at doorframe with pt noting good stretch over area where pain had been. Remainder of session focusing on balance activities with emphasis on turning motions as pt continues to note greatest tendency for LOB when turning - pt having some difficulty coordinating movement patterns and stumbled a few times but no major LOB. Will continue focus on balance and dynamic gait activities as musculoskeletal complaints allow.    Comorbidities Recurrent falls - 3 in past 6 months, 7 major falls in past 5-6 yrs (4 concussions, finger fracture); costochondritis, cellulitis of R breast, fibromyalgia, chronic LBP  since ~age 1, DDD, sciatica, osteoporosis, degenerative scoliosis, peripheral neuropathy, vertigo, R knee pain, CAD, COPD, HTN, h/o lumpectomy for R breast cancer 2013, ORIF R ankle s/p fall 2006    Rehab Potential Good    PT Frequency 2x / week    PT Duration 4 weeks    PT Treatment/Interventions ADLs/Self Care Home Management;Cryotherapy;Electrical Stimulation;Iontophoresis 62m/ml Dexamethasone;Moist Heat;Ultrasound;DME Instruction;Gait training;Stair training;Functional mobility training;Therapeutic activities;Therapeutic exercise;Balance training;Neuromuscular re-education;Patient/family education;Manual techniques;Passive range of  motion;Dry needling;Taping;Vasopneumatic Device;Vestibular;Visual/perceptual remediation/compensation;Spinal Manipulations    PT Next Visit Plan lumbopelvic strengthening and stabilization - HEP review/update PRN; balance and dynamic gait training; post-concussion management; manual therapy & modalities for pain including ionto patch and/or taping as benefit noted for B hip/greater trochanter pain; balance training; vestibular reassessment & treatment as indicated    PT Home Exercise Plan 6/24 - supported squat/eccentric stand>sit with yellow TB hip ABD isometric, seated yellow TB clam & marching; 7/23 - piriformis, ITB, hip flexor & HS stretches; 7/28 - yellow TB hook lying clam shell; 8/18 - chin tuck, UT & SCM stretches, scap retraction; 8/19 - seated horiz & vertical gaze stabilization; 8/31 - corner balance progression    Consulted and Agree with Plan of Care Patient           Patient will benefit from skilled therapeutic intervention in order to improve the following deficits and impairments:  Abnormal gait, Decreased activity tolerance, Decreased balance, Decreased coordination, Decreased endurance, Decreased knowledge of precautions, Decreased mobility, Decreased safety awareness, Decreased strength, Difficulty walking, Dizziness, Increased fascial restricitons, Increased muscle spasms, Impaired perceived functional ability, Impaired flexibility, Improper body mechanics, Postural dysfunction, Pain  Visit Diagnosis: Repeated falls  Unsteadiness on feet  Muscle weakness (generalized)  Other abnormalities of gait and mobility  Dizziness and giddiness  Chronic bilateral low back pain without sciatica  Intractable acute post-traumatic headache     Problem List Patient Active Problem List   Diagnosis Date Noted  . Low back pain 09/08/2020  . OSA and COPD overlap syndrome ([place] 06/10/2020  . Serotonin withdrawal syndrome without complication 015/17/6160 . Recurrent falls  05/02/2020  . Statin intolerance 02/29/2020  . RLS (restless legs syndrome) 11/09/2019  . Kidney stone 02/26/2019  . Recurrent sinusitis 02/25/2019  . Hx of colonic polyp 02/25/2019  . DDD (degenerative disc disease), cervical 09/17/2018  . Degenerative scoliosis 04/22/2018  . Primary insomnia 06/25/2017  . Chronic interstitial cystitis 02/01/2017  . Cough 01/09/2017  . Right arm pain 07/20/2016  . Deviated septum 05/12/2016  . Nasal turbinate hypertrophy 05/12/2016  . Dysphagia   . Allergic rhinitis 01/25/2016  . Other and combined forms of senile cataract 07/15/2015  . Dyspnea   . Left hip pain 04/30/2015  . Sciatica 04/30/2015  . Knee pain, right 04/30/2015  . Bilateral carpal tunnel syndrome 03/04/2015  . Hyperlipidemia 03/01/2015  . Pulmonary nodules 02/12/2015  . External hemorrhoids 02/05/2015  . Chronic night sweats 01/08/2015  . Atypical chest pain 01/01/2015  . Renal insufficiency 07/16/2014  . Memory loss 05/22/2014  . Peripheral neuropathy 05/22/2014  . Abdominal pain 10/26/2013  . Headache 10/13/2013  . Arthralgia 08/18/2013  . ANA positive 07/29/2013  . Depression 02/05/2013  . Family history of malignant hyperthermia 02/05/2013  . Malignant neoplasm of lower-outer quadrant of right breast of female, estrogen receptor positive ([place] 01/03/2013  . Splenic lesion 09/02/2012  . Asthma, allergic 08/05/2012  . GERD (gastroesophageal reflux disease) 06/03/2012  . Edema 04/08/2012  . Stricture and stenosis of esophagus 10/19/2011  . Functional constipation 07/21/2011  .  Nonspecific (abnormal) findings on radiological and other examination of biliary tract 07/21/2011  . Osteoporosis 04/17/2011  . Leg pain 02/24/2011  . FIBROCYSTIC BREAST DISEASE, HX OF 10/18/2010  . Essential hypertension 08/25/2010  . CAD in native artery 08/25/2010  . TOBACCO ABUSE, HX OF 08/25/2010  . GENERALIZED ANXIETY DISORDER 08/03/2010  . Fibromyalgia 01/11/2010    Percival Spanish,  PT, MPT 10/07/2020, 2:17 PM  [facility] [address]  [place], [place], [address] Phone: [phone]   Fax:  [phone]  Name: RENEE ERB MRN: [id number] Date of Birth: 1945/12/26

## 2020-10-11 ENCOUNTER — Other Ambulatory Visit: Payer: Self-pay

## 2020-10-11 ENCOUNTER — Ambulatory Visit: Payer: Medicare Other

## 2020-10-11 ENCOUNTER — Telehealth: Payer: Self-pay | Admitting: Cardiology

## 2020-10-11 DIAGNOSIS — G8929 Other chronic pain: Secondary | ICD-10-CM | POA: Diagnosis not present

## 2020-10-11 DIAGNOSIS — R2681 Unsteadiness on feet: Secondary | ICD-10-CM

## 2020-10-11 DIAGNOSIS — R42 Dizziness and giddiness: Secondary | ICD-10-CM | POA: Diagnosis not present

## 2020-10-11 DIAGNOSIS — R2689 Other abnormalities of gait and mobility: Secondary | ICD-10-CM

## 2020-10-11 DIAGNOSIS — G44311 Acute post-traumatic headache, intractable: Secondary | ICD-10-CM | POA: Diagnosis not present

## 2020-10-11 DIAGNOSIS — R296 Repeated falls: Secondary | ICD-10-CM

## 2020-10-11 DIAGNOSIS — M6281 Muscle weakness (generalized): Secondary | ICD-10-CM

## 2020-10-11 DIAGNOSIS — M545 Low back pain, unspecified: Secondary | ICD-10-CM | POA: Diagnosis not present

## 2020-10-11 NOTE — Therapy (Signed)
[facility] [address]  [place], [place], [address] Phone: [phone]   Fax:  [phone]  Physical Therapy Treatment  Patient Details  Name: [name] MRN: [id number] Date of Birth: April 19, 1946 Referring Provider (PT): [name], MD   Encounter Date: 10/11/2020   PT End of Session - 10/11/20 1409    Visit Number 26    Number of Visits 28    Date for PT Re-Evaluation 10/14/20    Authorization Type [employer]    PT Start Time 1359    PT Stop Time 1445    PT Time Calculation (min) 46 min    Activity Tolerance Patient tolerated treatment well    Behavior During Therapy [facility] for tasks assessed/performed           Past Medical History:  Diagnosis Date  . Allergy   . Anemia   . Anxiety    past hx   . Arthritis   . Asthma   . Atrial flutter ([place])    past history- not current  . CAD (coronary artery disease) CARDIOLOGIST--  DR Angelena Form   mild non-obstructive cad  . Cancer ([place])    right  . Cataract    bilaterally removed   . Chronic constipation   . Chronic kidney disease    interstitial cystitis  . Concussion    x 3  . COPD (chronic obstructive pulmonary disease) ([place])   . Depression    past hx   . Family history of malignant hyperthermia    father had this  . Fibromyalgia   . Frequency of urination   . GERD (gastroesophageal reflux disease)   . H/O hiatal hernia   . History of basal cell carcinoma excision    X2  . History of breast cancer ONCOLOGIST-- DR Jana Hakim---  NO RECURRANCE   DX 07/2012;  LOW GRADE DCIS  ER+PR+  ----  S/P RIGHT LUMPECTOMY WITH NEGATIVE MARGINS/   RADIATION ENDED 11/2012  . History of chronic bronchitis   . History of colonic polyps   . History of tachycardia    CONTROLLED  WITH ATENOLOL  . Hyperlipidemia   . Hypertension   . Neuromuscular disorder (HCC)    fibromyalgia  . Osteoporosis 01/2019   T score -2.2 stable/improved from prior study  . Pelvic  pain   . Personal history of radiation therapy   . S/P radiation therapy 11/12/12 - 12/05/12   right Breast  . Sepsis ([employer]) 2014   from UTI   . Sinus headache   . Urgency of urination     Past Surgical History:  Procedure Laterality Date  . [place] STUDY N/A 04/03/2016   Procedure: [place] STUDY;  Surgeon: Manus Gunning, MD;  Location: [facility];  Service: Gastroenterology;  Laterality: N/A;  . BREAST LUMPECTOMY Right 10-11-2012   W/ SLN BX  . CARDIAC CATHETERIZATION  09-13-2007  DR Lia Foyer   WELL-PRESERVED LVF/  DIFFUSE SCATTERED CORONARY CALCIFACATION AND ATHEROSCLEROSIS WITHOUT OBSTRUCTION  . CARDIAC CATHETERIZATION  08-04-2010  DR Angelena Form   NON-OBSTRUCTIVE CAD/  pLAD 40%/  oLAD 30%/  mLAD 30%/  pRCA 30%/  EF 60%  . CARDIOVASCULAR STRESS TEST  06-18-2012  DR McALHANY   LOW RISK NUCLEAR STUDY/  SMALL FIXED AREA OF MODERATELY DECREASED UPTAKE IN ANTEROSEPTAL WALL WHICH MAY BE ARTIFACTUAL/  NO ISCHEMIA/  EF 68%  . COLONOSCOPY  09-29-2010  . CYSTOSCOPY    . CYSTOSCOPY WITH HYDRODISTENSION  AND BIOPSY N/A 03/06/2014   Procedure: CYSTOSCOPY/HYDRODISTENSION/ INSTILATION OF MARCAINE AND PYRIDIUM;  Surgeon: Ailene Rud, MD;  Location: [facility];  Service: Urology;  Laterality: N/A;  . ESOPHAGEAL MANOMETRY N/A 04/03/2016   Procedure: ESOPHAGEAL MANOMETRY (EM);  Surgeon: Manus Gunning, MD;  Location: [facility];  Service: Gastroenterology;  Laterality: N/A;  . EXTRACORPOREAL SHOCK WAVE LITHOTRIPSY Left 02/06/2019   Procedure: EXTRACORPOREAL SHOCK WAVE LITHOTRIPSY (ESWL);  Surgeon: Kathie Rhodes, MD;  Location: [facility];  Service: Urology;  Laterality: Left;  . NASAL SINUS SURGERY  1985  . ORIF RIGHT ANKLE  FX  2006  . POLYPECTOMY    . REMOVAL VOCAL CORD CYST  FEB 2014  . RIGHT BREAST BX  08-23-2012  . RIGHT HAND SURGERY  X3  LAST ONE 2009   INCLUDES  ORIF RIGHT 5TH FINGER AND REVISION TWICE  . SKIN CANCER EXCISION    . TONSILLECTOMY AND  ADENOIDECTOMY  AGE 74  . TOTAL ABDOMINAL HYSTERECTOMY W/ BILATERAL SALPINGOOPHORECTOMY  1982   W/  APPENDECTOMY  . TRANSTHORACIC ECHOCARDIOGRAM  06-24-2012   GRADE I DIASTOLIC DYSFUNCTION/  EF 55-60%/  MILD MR  . UPPER GASTROINTESTINAL ENDOSCOPY      There were no vitals filed for this visit.   Subjective Assessment - 10/11/20 1422    Subjective Pt. noting L biceps pain today.    Pertinent History Recurrent falls - 3 in past 6 months, 7 major falls in past 5-6 yrs (4 concussions, finger fracture); costochondritis, cellulitis of R breast, fibromyalgia, chronic LBP since ~age 74, DDD, sciatica, osteoporosis, degenerative scoliosis, peripheral neuropathy, vertigo, R knee pain, CAD, COPD, HTN, h/o lumpectomy for R breast cancer 2013, ORIF R ankle s/p fall 2006    Patient Stated Goals "Be able to gain some strength in my legs to help with my back. To be able stand w/o feeling like I'm swaying and walk w/o tripping."    Currently in Pain? Yes    Pain Score 5     Pain Location Arm    Pain Orientation Left;Mid;Upper    Pain Descriptors / Indicators --   "warm"   Pain Type Acute pain    Pain Frequency Intermittent              [facility] PT Assessment - 10/11/20 0001      Functional Gait  Assessment   Gait assessed  Yes    Gait Level Surface Walks 20 ft in less than 5.5 sec, no assistive devices, good speed, no evidence for imbalance, normal gait pattern, deviates no more than 6 in outside of the 12 in walkway width.    Change in Gait Speed Able to change speed, demonstrates mild gait deviations, deviates 6-10 in outside of the 12 in walkway width, or no gait deviations, unable to achieve a major change in velocity, or uses a change in velocity, or uses an assistive device.    Gait with Horizontal Head Turns Performs head turns smoothly with slight change in gait velocity (eg, minor disruption to smooth gait path), deviates 6-10 in outside 12 in walkway width, or uses an assistive device.    Gait  with Vertical Head Turns Performs task with slight change in gait velocity (eg, minor disruption to smooth gait path), deviates 6 - 10 in outside 12 in walkway width or uses assistive device    Gait and Pivot Turn Pivot turns safely within 3 sec and stops quickly with no loss of balance.    Step Over  Obstacle Is able to step over 2 stacked shoe boxes taped together (9 in total height) without changing gait speed. No evidence of imbalance.    Gait with Narrow Base of Support Ambulates less than 4 steps heel to toe or cannot perform without assistance.    Gait with Eyes Closed Walks 20 ft, uses assistive device, slower speed, mild gait deviations, deviates 6-10 in outside 12 in walkway width. Ambulates 20 ft in less than 9 sec but greater than 7 sec.    Ambulating Backwards Walks 20 ft, uses assistive device, slower speed, mild gait deviations, deviates 6-10 in outside 12 in walkway width.    Steps Alternating feet, must use rail.    Total Score 21                         [facility] Adult PT Treatment/Exercise - 10/11/20 0001      Lumbar Exercises: Supine   Bridge 10 reps;3 seconds    Bridge Limitations cues for proper hold times       Knee/Hip Exercises: Aerobic   Nustep L5 x 6 min (UE/LE)      Manual Therapy   Soft tissue mobilization STM to L proximal biceps, L lateral deltoid, L mid biceps belly     Myofascial Release TPR to L lateral deltoid in area of complaint, L prox biceps       Neck Exercises: Stretches   Upper Trapezius Stretch Right;Left;30 seconds;1 rep    Levator Stretch Right;Left;1 rep;30 seconds                    PT Short Term Goals - 06/22/20 1015      PT SHORT TERM GOAL #1   Title Patient will be independent with initial HEP    Status Achieved   06/22/20     PT SHORT TERM GOAL #2   Title Complete FGA assessment    Status Achieved   06/22/20            PT Long Term Goals - 10/11/20 1444      PT LONG TERM GOAL #1   Title Patient will be  independent with ongoing/advanced HEP    Status Partially Met   09/16/20 - met for current HEP     PT LONG TERM GOAL #2   Title Patient will demonstrate improved B LE strength to >/= 4/5 to 4+/5 for improved stability and ease of mobility    Status Unable to assess   09/16/20 - MMT deferred d/t severe LBP from recent fall     PT LONG TERM GOAL #3   Title Patient will improve Berg score to >/= 52/56 to improve safety stability with ADLs in standing and reduce risk for falls    Status Achieved   09/16/20 Merrilee Jansky = 52/56     PT LONG TERM GOAL #4   Title Patient will report no further instances of falls    Status On-going   09/16/20 - most recent fall on 09/02/20     PT LONG TERM GOAL #5   Title Patient will improve FGA score to >/= 24/30 to improve gait stability and reduce risk for falls    Baseline Patient will improve FGA score to >/= 20/30 to improve gait stability and reduce risk for falls - met as of 09/16/20 & revised    Status Partially Met   10/11/20 - FGA = 21/30     PT LONG TERM GOAL #  6   Title Patient to report no dizziness remaining with bed mobility.    Status Achieved   08/24/20                Plan - 10/11/20 1410    Clinical Impression Statement Prest[place] doing ok.  With complaint of L biceps tenderness which "hurts all the time".  MT revealing multiple palpable TPs in L biceps, deltoid which were addressed with STM/TPR with some relief noted.  FGA score increased to 21/30 scoring today demonstrating mild reduction in fall risk with horizontal head turns during gait however still most balance deficit noted in tandem walk, up/down head motions requiring therapist supervision for safety.    Comorbidities Recurrent falls - 3 in past 6 months, 7 major falls in past 5-6 yrs (4 concussions, finger fracture); costochondritis, cellulitis of R breast, fibromyalgia, chronic LBP since ~age 19, DDD, sciatica, osteoporosis, degenerative scoliosis, peripheral neuropathy, vertigo, R knee pain,  CAD, COPD, HTN, h/o lumpectomy for R breast cancer 2013, ORIF R ankle s/p fall 2006    Rehab Potential Good    PT Frequency 2x / week    PT Duration 4 weeks    PT Treatment/Interventions ADLs/Self Care Home Management;Cryotherapy;Electrical Stimulation;Iontophoresis 4mg /ml Dexamethasone;Moist Heat;Ultrasound;DME Instruction;Gait training;Stair training;Functional mobility training;Therapeutic activities;Therapeutic exercise;Balance training;Neuromuscular re-education;Patient/family education;Manual techniques;Passive range of motion;Dry needling;Taping;Vasopneumatic Device;Vestibular;Visual/perceptual remediation/compensation;Spinal Manipulations    PT Next Visit Plan lumbopelvic strengthening and stabilization - HEP review/update PRN; balance and dynamic gait training; post-concussion management; manual therapy & modalities for pain including ionto patch and/or taping as benefit noted for B hip/greater trochanter pain; balance training; vestibular reassessment & treatment as indicated    PT Home Exercise Plan 6/24 - supported squat/eccentric stand>sit with yellow TB hip ABD isometric, seated yellow TB clam & marching; 7/23 - piriformis, ITB, hip flexor & HS stretches; 7/28 - yellow TB hook lying clam shell; 8/18 - chin tuck, UT & SCM stretches, scap retraction; 8/19 - seated horiz & vertical gaze stabilization; 8/31 - corner balance progression    Consulted and Agree with Plan of Care Patient           Patient will benefit from skilled therapeutic intervention in order to improve the following deficits and impairments:  Abnormal gait, Decreased activity tolerance, Decreased balance, Decreased coordination, Decreased endurance, Decreased knowledge of precautions, Decreased mobility, Decreased safety awareness, Decreased strength, Difficulty walking, Dizziness, Increased fascial restricitons, Increased muscle spasms, Impaired perceived functional ability, Impaired flexibility, Improper body mechanics,  Postural dysfunction, Pain  Visit Diagnosis: Repeated falls  Unsteadiness on feet  Muscle weakness (generalized)  Other abnormalities of gait and mobility  Dizziness and giddiness  Chronic bilateral low back pain without sciatica  Intractable acute post-traumatic headache     Problem List Patient Active Problem List   Diagnosis Date Noted  . Low back pain 09/08/2020  . OSA and COPD overlap syndrome ([place]) 06/10/2020  . Serotonin withdrawal syndrome without complication 88/41/6606  . Recurrent falls 05/02/2020  . Statin intolerance 02/29/2020  . RLS (restless legs syndrome) 11/09/2019  . Kidney stone 02/26/2019  . Recurrent sinusitis 02/25/2019  . Hx of colonic polyp 02/25/2019  . DDD (degenerative disc disease), cervical 09/17/2018  . Degenerative scoliosis 04/22/2018  . Primary insomnia 06/25/2017  . Chronic interstitial cystitis 02/01/2017  . Cough 01/09/2017  . Right arm pain 07/20/2016  . Deviated septum 05/12/2016  . Nasal turbinate hypertrophy 05/12/2016  . Dysphagia   . Allergic rhinitis 01/25/2016  . Other and combined forms of senile cataract 07/15/2015  .  Dyspnea   . Left hip pain 04/30/2015  . Sciatica 04/30/2015  . Knee pain, right 04/30/2015  . Bilateral carpal tunnel syndrome 03/04/2015  . Hyperlipidemia 03/01/2015  . Pulmonary nodules 02/12/2015  . External hemorrhoids 02/05/2015  . Chronic night sweats 01/08/2015  . Atypical chest pain 01/01/2015  . Renal insufficiency 07/16/2014  . Memory loss 05/22/2014  . Peripheral neuropathy 05/22/2014  . Abdominal pain 10/26/2013  . Headache 10/13/2013  . Arthralgia 08/18/2013  . ANA positive 07/29/2013  . Depression 02/05/2013  . Family history of malignant hyperthermia 02/05/2013  . Malignant neoplasm of lower-outer quadrant of right breast of female, estrogen receptor positive ([place]) 01/03/2013  . Splenic lesion 09/02/2012  . Asthma, allergic 08/05/2012  . GERD (gastroesophageal reflux disease)  06/03/2012  . Edema 04/08/2012  . Stricture and stenosis of esophagus 10/19/2011  . Functional constipation 07/21/2011  . Nonspecific (abnormal) findings on radiological and other examination of biliary tract 07/21/2011  . Osteoporosis 04/17/2011  . Leg pain 02/24/2011  . FIBROCYSTIC BREAST DISEASE, HX OF 10/18/2010  . Essential hypertension 08/25/2010  . CAD in native artery 08/25/2010  . TOBACCO ABUSE, HX OF 08/25/2010  . GENERALIZED ANXIETY DISORDER 08/03/2010  . Fibromyalgia 01/11/2010    Bess Harvest, PTA 10/11/20 6:34 PM   [facility] [address]  [place], [place], [address] Phone: [phone]   Fax:  [phone]  Name: KARIANNA GUSMAN MRN: [id number] Date of Birth: [birth year]

## 2020-10-11 NOTE — Telephone Encounter (Signed)
    Patient calling the office for samples of medication:   1.  What medication and dosage are you requesting samples for?  REPATHA SURECLICK 756 MG/ML SOAJ     2.  Are you currently out of this medication? Yes  Pt said Erasmo Downer told her to call after 2 weeks to check if there's a repatha samples she can get

## 2020-10-14 ENCOUNTER — Encounter: Payer: Self-pay | Admitting: Physical Therapy

## 2020-10-14 ENCOUNTER — Ambulatory Visit: Payer: Medicare Other | Admitting: Physical Therapy

## 2020-10-14 ENCOUNTER — Other Ambulatory Visit: Payer: Self-pay

## 2020-10-14 DIAGNOSIS — G44311 Acute post-traumatic headache, intractable: Secondary | ICD-10-CM | POA: Diagnosis not present

## 2020-10-14 DIAGNOSIS — G8929 Other chronic pain: Secondary | ICD-10-CM

## 2020-10-14 DIAGNOSIS — R42 Dizziness and giddiness: Secondary | ICD-10-CM | POA: Diagnosis not present

## 2020-10-14 DIAGNOSIS — R2681 Unsteadiness on feet: Secondary | ICD-10-CM | POA: Diagnosis not present

## 2020-10-14 DIAGNOSIS — M545 Low back pain, unspecified: Secondary | ICD-10-CM | POA: Diagnosis not present

## 2020-10-14 DIAGNOSIS — R296 Repeated falls: Secondary | ICD-10-CM | POA: Diagnosis not present

## 2020-10-14 DIAGNOSIS — R2689 Other abnormalities of gait and mobility: Secondary | ICD-10-CM

## 2020-10-14 DIAGNOSIS — M6281 Muscle weakness (generalized): Secondary | ICD-10-CM | POA: Diagnosis not present

## 2020-10-14 NOTE — Telephone Encounter (Signed)
Sample in [place]

## 2020-10-14 NOTE — Therapy (Signed)
[facility] [place] [facility] [address]  [place], [place], [address] Phone: [phone]   Fax:  [phone]  Physical Therapy Treatment / Recert  Patient Details  Name: [name] MRN: [id number] Date of Birth: Apr 27, 1946 Referring Provider (PT): [name], MD  Progress Note  Reporting Period 09/16/2020 to 10/14/2020  See note below for Objective Data and Assessment of Progress/Goals.      Encounter Date: 10/14/2020   PT End of Session - 10/14/20 1406    Visit Number 27    Number of Visits 35    Date for PT Re-Evaluation 11/11/20    Authorization Type [employer]    PT Start Time 1406    PT Stop Time 1450    PT Time Calculation (min) 44 min    Activity Tolerance Patient tolerated treatment well    Behavior During Therapy WFL for tasks assessed/performed           Past Medical History:  Diagnosis Date  . Allergy   . Anemia   . Anxiety    past hx   . Arthritis   . Asthma   . Atrial flutter ([place])    past history- not current  . CAD (coronary artery disease) CARDIOLOGIST--  DR Angelena Form   mild non-obstructive cad  . Cancer ([employer])    right  . Cataract    bilaterally removed   . Chronic constipation   . Chronic kidney disease    interstitial cystitis  . Concussion    x 3  . COPD (chronic obstructive pulmonary disease) ([place])   . Depression    past hx   . Family history of malignant hyperthermia    father had this  . Fibromyalgia   . Frequency of urination   . GERD (gastroesophageal reflux disease)   . H/O hiatal hernia   . History of basal cell carcinoma excision    X2  . History of breast cancer ONCOLOGIST-- DR Jana Hakim---  NO RECURRANCE   DX 07/2012;  LOW GRADE DCIS  ER+PR+  ----  S/P RIGHT LUMPECTOMY WITH NEGATIVE MARGINS/   RADIATION ENDED 11/2012  . History of chronic bronchitis   . History of colonic polyps   . History of tachycardia    CONTROLLED  WITH ATENOLOL  . Hyperlipidemia   .  Hypertension   . Neuromuscular disorder (HCC)    fibromyalgia  . Osteoporosis 01/2019   T score -2.2 stable/improved from prior study  . Pelvic pain   . Personal history of radiation therapy   . S/P radiation therapy 11/12/12 - 12/05/12   right Breast  . Sepsis ([place]) 2014   from UTI   . Sinus headache   . Urgency of urination     Past Surgical History:  Procedure Laterality Date  . [place] STUDY N/A 04/03/2016   Procedure: [place] STUDY;  Surgeon: Manus Gunning, MD;  Location: [facility];  Service: Gastroenterology;  Laterality: N/A;  . BREAST LUMPECTOMY Right 10-11-2012   W/ SLN BX  . CARDIAC CATHETERIZATION  09-13-2007  DR Lia Foyer   WELL-PRESERVED LVF/  DIFFUSE SCATTERED CORONARY CALCIFACATION AND ATHEROSCLEROSIS WITHOUT OBSTRUCTION  . CARDIAC CATHETERIZATION  08-04-2010  DR Angelena Form   NON-OBSTRUCTIVE CAD/  pLAD 40%/  oLAD 30%/  mLAD 30%/  pRCA 30%/  EF 60%  . CARDIOVASCULAR STRESS TEST  06-18-2012  DR McALHANY   LOW RISK NUCLEAR STUDY/  SMALL FIXED AREA OF MODERATELY DECREASED UPTAKE IN ANTEROSEPTAL WALL  WHICH MAY BE ARTIFACTUAL/  NO ISCHEMIA/  EF 68%  . COLONOSCOPY  09-29-2010  . CYSTOSCOPY    . CYSTOSCOPY WITH HYDRODISTENSION AND BIOPSY N/A 03/06/2014   Procedure: CYSTOSCOPY/HYDRODISTENSION/ INSTILATION OF MARCAINE AND PYRIDIUM;  Surgeon: Ailene Rud, MD;  Location[facility];  Service: Urology;  Laterality: N/A;  . ESOPHAGEAL MANOMETRY N/A 04/03/2016   Procedure: ESOPHAGEAL MANOMETRY (EM);  Surgeon: Manus Gunning, MD;  Location: [facility];  Service: Gastroenterology;  Laterality: N/A;  . EXTRACORPOREAL SHOCK WAVE LITHOTRIPSY Left 02/06/2019   Procedure: EXTRACORPOREAL SHOCK WAVE LITHOTRIPSY (ESWL);  Surgeon: Kathie Rhodes, MD;  Location: [facility];  Service: Urology;  Laterality: Left;  . NASAL SINUS SURGERY  1985  . ORIF RIGHT ANKLE  FX  2006  . POLYPECTOMY    . REMOVAL VOCAL CORD CYST  FEB 2014  . RIGHT BREAST BX   08-23-2012  . RIGHT HAND SURGERY  X3  LAST ONE 2009   INCLUDES  ORIF RIGHT 5TH FINGER AND REVISION TWICE  . SKIN CANCER EXCISION    . TONSILLECTOMY AND ADENOIDECTOMY  AGE 23  . TOTAL ABDOMINAL HYSTERECTOMY W/ BILATERAL SALPINGOOPHORECTOMY  1982   W/  APPENDECTOMY  . TRANSTHORACIC ECHOCARDIOGRAM  06-24-2012   GRADE I DIASTOLIC DYSFUNCTION/  EF 55-60%/  MILD MR  . UPPER GASTROINTESTINAL ENDOSCOPY      There were no vitals filed for this visit.   Subjective Assessment - 10/14/20 1409    Subjective Pt reports no pain all week until yesterday when she had onset of LBP after increased walking. Woke up this morning and pain was gone, but then pain returned about 30 minutes before she left to come to PT.    Pertinent History Recurrent falls - 3 in past 6 months, 7 major falls in past 5-6 yrs (4 concussions, finger fracture); costochondritis, cellulitis of R breast, fibromyalgia, chronic LBP since ~age 17, DDD, sciatica, osteoporosis, degenerative scoliosis, peripheral neuropathy, vertigo, R knee pain, CAD, COPD, HTN, h/o lumpectomy for R breast cancer 2013, ORIF R ankle s/p fall 2006    Patient Stated Goals "Be able to gain some strength in my legs to help with my back. To be able stand w/o feeling like I'm swaying and walk w/o tripping."    Currently in Pain? Yes    Pain Score 5     Pain Location Back    Pain Orientation Right    Pain Descriptors / Indicators Burning;Pressure    Pain Type Acute pain    Pain Frequency Intermittent              [facility] PT Assessment - 10/14/20 1406      Assessment   Medical Diagnosis Multiple falls    Referring Provider (PT) [name], MD    Next MD Visit 03/08/21      Prior Function   Level of Independence Independent    Vocation Retired    [employer] retired Therapist, sports    Leisure gardening, reading, sewing, being outdoors (feels uncomfortable in back yard due to slope), walking 1/2 mile 2x/wk      Strength   Right Hip Flexion 4/5    Right Hip  Extension 4+/5    Right Hip External Rotation  4/5    Right Hip Internal Rotation 4+/5    Right Hip ABduction 4+/5    Right Hip ADduction 4+/5    Left Hip Flexion 4/5    Left Hip Extension 4+/5    Left Hip External Rotation 4/5  Left Hip Internal Rotation 4+/5    Left Hip ABduction 4+/5    Left Hip ADduction 4+/5    Right Knee Flexion 5/5    Right Knee Extension 5/5    Left Knee Flexion 5/5    Left Knee Extension 5/5    Right Ankle Dorsiflexion 4-/5    Right Ankle Plantar Flexion 4-/5    Right Ankle Inversion 4-/5    Right Ankle Eversion 4/5    Left Ankle Dorsiflexion 4-/5    Left Ankle Plantar Flexion 4-/5    Left Ankle Inversion 4-/5    Left Ankle Eversion 4/5      Functional Gait  Assessment   Gait assessed  --   testing as of 10/11/20   Gait Level Surface Walks 20 ft in less than 5.5 sec, no assistive devices, good speed, no evidence for imbalance, normal gait pattern, deviates no more than 6 in outside of the 12 in walkway width.    Change in Gait Speed Able to change speed, demonstrates mild gait deviations, deviates 6-10 in outside of the 12 in walkway width, or no gait deviations, unable to achieve a major change in velocity, or uses a change in velocity, or uses an assistive device.    Gait with Horizontal Head Turns Performs head turns smoothly with slight change in gait velocity (eg, minor disruption to smooth gait path), deviates 6-10 in outside 12 in walkway width, or uses an assistive device.    Gait with Vertical Head Turns Performs task with slight change in gait velocity (eg, minor disruption to smooth gait path), deviates 6 - 10 in outside 12 in walkway width or uses assistive device    Gait and Pivot Turn Pivot turns safely within 3 sec and stops quickly with no loss of balance.    Step Over Obstacle Is able to step over 2 stacked shoe boxes taped together (9 in total height) without changing gait speed. No evidence of imbalance.    Gait with Narrow Base of  Support Ambulates less than 4 steps heel to toe or cannot perform without assistance.    Gait with Eyes Closed Walks 20 ft, uses assistive device, slower speed, mild gait deviations, deviates 6-10 in outside 12 in walkway width. Ambulates 20 ft in less than 9 sec but greater than 7 sec.    Ambulating Backwards Walks 20 ft, uses assistive device, slower speed, mild gait deviations, deviates 6-10 in outside 12 in walkway width.    Steps Alternating feet, must use rail.    Total Score 21    FGA comment: 19-24 = medium risk fall                         [facility] Adult PT Treatment/Exercise - 10/14/20 1406      Knee/Hip Exercises: Aerobic   Recumbent Bike L2 x 6 min      Manual Therapy   Manual Therapy Muscle Energy Technique    Muscle Energy Technique MET for correction of SIJ alignment d/t apparent R anterior innominate rotation of pelvis on sacrum - alignment appreared normalized following MET                    PT Short Term Goals - 06/22/20 1015      PT SHORT TERM GOAL #1   Title Patient will be independent with initial HEP    Status Achieved   06/22/20     PT SHORT TERM GOAL #2  Title Complete FGA assessment    Status Achieved   06/22/20            PT Long Term Goals - 10/14/20 1427      PT LONG TERM GOAL #1   Title Patient will be independent with ongoing/advanced HEP    Status Partially Met   10/14/20 - met for current HEP   Target Date 11/11/20      PT LONG TERM GOAL #2   Title Patient will demonstrate improved B LE strength to >/= 4/5 to 4+/5 for improved stability and ease of mobility    Status Partially Met    Target Date 11/11/20      PT LONG TERM GOAL #3   Title Patient will improve Berg score to >/= 52/56 to improve safety stability with ADLs in standing and reduce risk for falls    Status Achieved   09/16/20 Merrilee Jansky = 52/56     PT LONG TERM GOAL #4   Title Patient will report no further instances of falls    Status Achieved   10/14/20 -  no falls since 09/02/20     PT LONG TERM GOAL #5   Title Patient will improve FGA score to >/= 24/30 to improve gait stability and reduce risk for falls    Baseline Patient will improve FGA score to >/= 20/30 to improve gait stability and reduce risk for falls - met as of 09/16/20 & revised    Status Partially Met   10/11/20 - FGA = 21/30   Target Date 11/11/20      PT LONG TERM GOAL #6   Title Patient to report no dizziness remaining with bed mobility.    Status Achieved   08/24/20                Plan - 10/14/20 1450    Clinical Impression Statement [name] reports she had been pain-free all week until new onset of R sided LBP after doing a lot of walking yesterday with further conversation revealing that she had a misstep on the stairs where she landed hard on one foot while trying to catch her balance (no fall). Given this and localization of pain to R SIJ, SIJ assessment revealing apparent R anterior innominate rotation of pelvis on sacrum with resultant shortening of R leg - able to correct alignment with MET with pt noting some reduction in pain. Progress with PT has been somewhat slower due to intermittent pain exacerbations such as above or fall last month leading to concussion limiting tolerance for mobility and therapeutic activities related to her weakness and balance deficits, hence these goals remain only partially met although improvements noted toward all goals. [name] is pleased with her progress, but she and I agree that there is potential for additional improvement, therefore will recert for additional 8-4X/LK x 4 weeks.    Comorbidities Recurrent falls - 3 in past 6 months, 7 major falls in past 5-6 yrs (4 concussions, finger fracture); costochondritis, cellulitis of R breast, fibromyalgia, chronic LBP since ~age 15, DDD, sciatica, osteoporosis, degenerative scoliosis, peripheral neuropathy, vertigo, R knee pain, CAD, COPD, HTN, h/o lumpectomy for R breast cancer 2013, ORIF R ankle  s/p fall 2006    Rehab Potential Good    PT Frequency 2x / week   1-2x/wk   PT Duration 4 weeks    PT Treatment/Interventions ADLs/Self Care Home Management;Cryotherapy;Electrical Stimulation;Iontophoresis 45m/ml Dexamethasone;Moist Heat;Ultrasound;DME Instruction;Gait training;Stair training;Functional mobility training;Therapeutic activities;Therapeutic exercise;Balance training;Neuromuscular re-education;Patient/family education;Manual techniques;Passive range of  motion;Dry needling;Taping;Vasopneumatic Device;Vestibular;Visual/perceptual remediation/compensation;Spinal Manipulations    PT Next Visit Plan lumbopelvic strengthening and stabilization - HEP review/update PRN; balance and dynamic gait training - possible [facility] Fall Prevention Program; manual therapy & modalities for pain including ionto patch and/or taping as benefit noted for B hip/greater trochanter pain; balance training; vestibular reassessment & treatment as indicated    PT Home Exercise Plan 6/24 - supported squat/eccentric stand>sit with yellow TB hip ABD isometric, seated yellow TB clam & marching; 7/23 - piriformis, ITB, hip flexor & HS stretches; 7/28 - yellow TB hook lying clam shell; 8/18 - chin tuck, UT & SCM stretches, scap retraction; 8/19 - seated horiz & vertical gaze stabilization; 8/31 - corner balance progression; 10/21 - heel/toe raises    Consulted and Agree with Plan of Care Patient           Patient will benefit from skilled therapeutic intervention in order to improve the following deficits and impairments:  Abnormal gait, Decreased activity tolerance, Decreased balance, Decreased coordination, Decreased endurance, Decreased knowledge of precautions, Decreased mobility, Decreased safety awareness, Decreased strength, Difficulty walking, Dizziness, Increased fascial restricitons, Increased muscle spasms, Impaired perceived functional ability, Impaired flexibility, Improper body mechanics, Postural dysfunction,  Pain  Visit Diagnosis: Repeated falls - Plan: PT plan of care cert/re-cert  Unsteadiness on feet - Plan: PT plan of care cert/re-cert  Muscle weakness (generalized) - Plan: PT plan of care cert/re-cert  Other abnormalities of gait and mobility - Plan: PT plan of care cert/re-cert  Dizziness and giddiness - Plan: PT plan of care cert/re-cert  Chronic bilateral low back pain without sciatica - Plan: PT plan of care cert/re-cert     Problem List Patient Active Problem List   Diagnosis Date Noted  . Low back pain 09/08/2020  . OSA and COPD overlap syndrome ([place]) 06/10/2020  . Serotonin withdrawal syndrome without complication 42/68/3419  . Recurrent falls 05/02/2020  . Statin intolerance 02/29/2020  . RLS (restless legs syndrome) 11/09/2019  . Kidney stone 02/26/2019  . Recurrent sinusitis 02/25/2019  . Hx of colonic polyp 02/25/2019  . DDD (degenerative disc disease), cervical 09/17/2018  . Degenerative scoliosis 04/22/2018  . Primary insomnia 06/25/2017  . Chronic interstitial cystitis 02/01/2017  . Cough 01/09/2017  . Right arm pain 07/20/2016  . Deviated septum 05/12/2016  . Nasal turbinate hypertrophy 05/12/2016  . Dysphagia   . Allergic rhinitis 01/25/2016  . Other and combined forms of senile cataract 07/15/2015  . Dyspnea   . Left hip pain 04/30/2015  . Sciatica 04/30/2015  . Knee pain, right 04/30/2015  . Bilateral carpal tunnel syndrome 03/04/2015  . Hyperlipidemia 03/01/2015  . Pulmonary nodules 02/12/2015  . External hemorrhoids 02/05/2015  . Chronic night sweats 01/08/2015  . Atypical chest pain 01/01/2015  . Renal insufficiency 07/16/2014  . Memory loss 05/22/2014  . Peripheral neuropathy 05/22/2014  . Abdominal pain 10/26/2013  . Headache 10/13/2013  . Arthralgia 08/18/2013  . ANA positive 07/29/2013  . Depression 02/05/2013  . Family history of malignant hyperthermia 02/05/2013  . Malignant neoplasm of lower-outer quadrant of right breast of  female, estrogen receptor positive ([place]) 01/03/2013  . Splenic lesion 09/02/2012  . Asthma, allergic 08/05/2012  . GERD (gastroesophageal reflux disease) 06/03/2012  . Edema 04/08/2012  . Stricture and stenosis of esophagus 10/19/2011  . Functional constipation 07/21/2011  . Nonspecific (abnormal) findings on radiological and other examination of biliary tract 07/21/2011  . Osteoporosis 04/17/2011  . Leg pain 02/24/2011  . FIBROCYSTIC BREAST DISEASE, HX OF 10/18/2010  .  Essential hypertension 08/25/2010  . CAD in native artery 08/25/2010  . TOBACCO ABUSE, HX OF 08/25/2010  . GENERALIZED ANXIETY DISORDER 08/03/2010  . Fibromyalgia 01/11/2010    Percival Spanish, PT, MPT 10/14/2020, 6:56 PM  [facility] [address]  [place], [place], [address] Phone: [phone]   Fax:  [phone]  Name: REIGHLYNN SWINEY MRN: [id number] Date of Birth: [birth year]

## 2020-10-14 NOTE — Patient Instructions (Signed)
    Home exercise program created by Clester Chlebowski, PT.  For questions, please contact Ericha Whittingham via phone at [phone] or email at [email or web address]  [facility] [address]  [address] [place], [place], [address] Phone: [phone]   Fax:  [phone]    

## 2020-10-18 NOTE — Telephone Encounter (Signed)
    Pt said she is coming by today to pick up repatha samples

## 2020-10-20 ENCOUNTER — Encounter: Payer: Self-pay | Admitting: Physical Therapy

## 2020-10-20 ENCOUNTER — Ambulatory Visit: Payer: Medicare Other | Admitting: Physical Therapy

## 2020-10-20 ENCOUNTER — Other Ambulatory Visit: Payer: Self-pay

## 2020-10-20 DIAGNOSIS — R2689 Other abnormalities of gait and mobility: Secondary | ICD-10-CM | POA: Diagnosis not present

## 2020-10-20 DIAGNOSIS — G8929 Other chronic pain: Secondary | ICD-10-CM | POA: Diagnosis not present

## 2020-10-20 DIAGNOSIS — R42 Dizziness and giddiness: Secondary | ICD-10-CM

## 2020-10-20 DIAGNOSIS — R296 Repeated falls: Secondary | ICD-10-CM | POA: Diagnosis not present

## 2020-10-20 DIAGNOSIS — G44311 Acute post-traumatic headache, intractable: Secondary | ICD-10-CM | POA: Diagnosis not present

## 2020-10-20 DIAGNOSIS — M6281 Muscle weakness (generalized): Secondary | ICD-10-CM | POA: Diagnosis not present

## 2020-10-20 DIAGNOSIS — M545 Low back pain, unspecified: Secondary | ICD-10-CM

## 2020-10-20 DIAGNOSIS — R2681 Unsteadiness on feet: Secondary | ICD-10-CM

## 2020-10-20 NOTE — Therapy (Signed)
[facility] [address]  [place], [place], [address] Phone: [phone]   Fax:  [phone]  Physical Therapy Treatment  Patient Details  Name: [name] MRN: [id number] Date of Birth: 02-Dec-1946 Referring Provider (PT): [name], MD   Encounter Date: 10/20/2020   PT End of Session - 10/20/20 1015    Visit Number 28    Number of Visits 35    Date for PT Re-Evaluation 11/11/20    Authorization Type [employer]    PT Start Time 6962    PT Stop Time 1104    PT Time Calculation (min) 49 min    Activity Tolerance Patient tolerated treatment well    Behavior During Therapy [facility] for tasks assessed/performed           Past Medical History:  Diagnosis Date  . Allergy   . Anemia   . Anxiety    past hx   . Arthritis   . Asthma   . Atrial flutter ([place])    past history- not current  . CAD (coronary artery disease) CARDIOLOGIST--  DR Angelena Form   mild non-obstructive cad  . Cancer ([place])    right  . Cataract    bilaterally removed   . Chronic constipation   . Chronic kidney disease    interstitial cystitis  . Concussion    x 3  . COPD (chronic obstructive pulmonary disease) ([place])   . Depression    past hx   . Family history of malignant hyperthermia    father had this  . Fibromyalgia   . Frequency of urination   . GERD (gastroesophageal reflux disease)   . H/O hiatal hernia   . History of basal cell carcinoma excision    X2  . History of breast cancer ONCOLOGIST-- DR Jana Hakim---  NO RECURRANCE   DX 07/2012;  LOW GRADE DCIS  ER+PR+  ----  S/P RIGHT LUMPECTOMY WITH NEGATIVE MARGINS/   RADIATION ENDED 11/2012  . History of chronic bronchitis   . History of colonic polyps   . History of tachycardia    CONTROLLED  WITH ATENOLOL  . Hyperlipidemia   . Hypertension   . Neuromuscular disorder (HCC)    fibromyalgia  . Osteoporosis 01/2019   T score -2.2 stable/improved from prior study  . Pelvic  pain   . Personal history of radiation therapy   . S/P radiation therapy 11/12/12 - 12/05/12   right Breast  . Sepsis ([place]) 2014   from UTI   . Sinus headache   . Urgency of urination     Past Surgical History:  Procedure Laterality Date  . [place] STUDY N/A 04/03/2016   Procedure: [place] STUDY;  Surgeon: Manus Gunning, MD;  Location: [facility];  Service: Gastroenterology;  Laterality: N/A;  . BREAST LUMPECTOMY Right 10-11-2012   W/ SLN BX  . CARDIAC CATHETERIZATION  09-13-2007  DR Lia Foyer   WELL-PRESERVED LVF/  DIFFUSE SCATTERED CORONARY CALCIFACATION AND ATHEROSCLEROSIS WITHOUT OBSTRUCTION  . CARDIAC CATHETERIZATION  08-04-2010  DR Angelena Form   NON-OBSTRUCTIVE CAD/  pLAD 40%/  oLAD 30%/  mLAD 30%/  pRCA 30%/  EF 60%  . CARDIOVASCULAR STRESS TEST  06-18-2012  DR McALHANY   LOW RISK NUCLEAR STUDY/  SMALL FIXED AREA OF MODERATELY DECREASED UPTAKE IN ANTEROSEPTAL WALL WHICH MAY BE ARTIFACTUAL/  NO ISCHEMIA/  EF 68%  . COLONOSCOPY  09-29-2010  . CYSTOSCOPY    . CYSTOSCOPY WITH HYDRODISTENSION  AND BIOPSY N/A 03/06/2014   Procedure: CYSTOSCOPY/HYDRODISTENSION/ INSTILATION OF MARCAINE AND PYRIDIUM;  Surgeon: Ailene Rud, MD;  Location: [facility];  Service: Urology;  Laterality: N/A;  . ESOPHAGEAL MANOMETRY N/A 04/03/2016   Procedure: ESOPHAGEAL MANOMETRY (EM);  Surgeon: Manus Gunning, MD;  Location: [facility];  Service: Gastroenterology;  Laterality: N/A;  . EXTRACORPOREAL SHOCK WAVE LITHOTRIPSY Left 02/06/2019   Procedure: EXTRACORPOREAL SHOCK WAVE LITHOTRIPSY (ESWL);  Surgeon: Kathie Rhodes, MD;  Location: [facility];  Service: Urology;  Laterality: Left;  . NASAL SINUS SURGERY  1985  . ORIF RIGHT ANKLE  FX  2006  . POLYPECTOMY    . REMOVAL VOCAL CORD CYST  FEB 2014  . RIGHT BREAST BX  08-23-2012  . RIGHT HAND SURGERY  X3  LAST ONE 2009   INCLUDES  ORIF RIGHT 5TH FINGER AND REVISION TWICE  . SKIN CANCER EXCISION    . TONSILLECTOMY AND  ADENOIDECTOMY  AGE 45  . TOTAL ABDOMINAL HYSTERECTOMY W/ BILATERAL SALPINGOOPHORECTOMY  1982   W/  APPENDECTOMY  . TRANSTHORACIC ECHOCARDIOGRAM  06-24-2012   GRADE I DIASTOLIC DYSFUNCTION/  EF 55-60%/  MILD MR  . UPPER GASTROINTESTINAL ENDOSCOPY      There were no vitals filed for this visit.   Subjective Assessment - 10/20/20 1017    Subjective Pt stating "I'm almost afraid to say it, but I'm doing pretty good for the past few days". Has her appt to see the neuro specialist at [facility].    Pertinent History Recurrent falls - 3 in past 6 months, 7 major falls in past 5-6 yrs (4 concussions, finger fracture); costochondritis, cellulitis of R breast, fibromyalgia, chronic LBP since ~age 88, DDD, sciatica, osteoporosis, degenerative scoliosis, peripheral neuropathy, vertigo, R knee pain, CAD, COPD, HTN, h/o lumpectomy for R breast cancer 2013, ORIF R ankle s/p fall 2006    Patient Stated Goals "Be able to gain some strength in my legs to help with my back. To be able stand w/o feeling like I'm swaying and walk w/o tripping."    Currently in Pain? No/denies                             [facility] Adult PT Treatment/Exercise - 10/20/20 1015      Knee/Hip Exercises: Aerobic   Recumbent Bike L2 x 6 min               Balance Exercises - 10/20/20 1015      [place] PROGRAM   Head Movements 5 reps;Standing    Neck Movements 5 reps;Standing    Back Extension 5 reps;Standing    Trunk Movements 5 reps;Standing    Ankle Movements 10 reps;Standing    Knee Extensor 10 reps    Knee Flexor 10 reps    Hip ABductor 10 reps    Ankle Plantorflexors 20 reps, no support    Ankle Dorsiflexors 20 reps, support    Knee Bends 10 reps, support    Backwards Walking Support    Walking and Turning Around No assistive device    Sideways Walking No assistive device    Tandem Stance 10 seconds, support    Tandem Walk Support    One Leg Stand 10 seconds, support    Heel Walking Support    Toe Walk  Support    Heel Toe Walking Backward --   Support on counter   Sit to Stand 10 reps, no support    Overall  OTAGO Comments [place] HEP booklet provided with exercises to be performed at home highlighted               PT Short Term Goals - 06/22/20 1015      PT SHORT TERM GOAL #1   Title Patient will be independent with initial HEP    Status Achieved   06/22/20     PT SHORT TERM GOAL #2   Title Complete FGA assessment    Status Achieved   06/22/20            PT Long Term Goals - 10/14/20 1427      PT LONG TERM GOAL #1   Title Patient will be independent with ongoing/advanced HEP    Status Partially Met   10/14/20 - met for current HEP   Target Date 11/11/20      PT LONG TERM GOAL #2   Title Patient will demonstrate improved B LE strength to >/= 4/5 to 4+/5 for improved stability and ease of mobility    Status Partially Met    Target Date 11/11/20      PT LONG TERM GOAL #3   Title Patient will improve Berg score to >/= 52/56 to improve safety stability with ADLs in standing and reduce risk for falls    Status Achieved   09/16/20 Merrilee Jansky = 52/56     PT LONG TERM GOAL #4   Title Patient will report no further instances of falls    Status Achieved   10/14/20 - no falls since 09/02/20     PT LONG TERM GOAL #5   Title Patient will improve FGA score to >/= 24/30 to improve gait stability and reduce risk for falls    Baseline Patient will improve FGA score to >/= 20/30 to improve gait stability and reduce risk for falls - met as of 09/16/20 & revised    Status Partially Met   10/11/20 - FGA = 21/30   Target Date 11/11/20      PT LONG TERM GOAL #6   Title Patient to report no dizziness remaining with bed mobility.    Status Achieved   08/24/20                Plan - 10/20/20 1021    Clinical Impression Statement [name] reports she is having a good day with "no aches or pains for the first time in a while". States she was able to travel 7 hrs in the car (2 - 3.5 hr drives)  over the weekend without increased back pain by managing pain with rest breaks, walking around and stretching - she notes that this was the first time in several years that she has been able to travel in the car w/o increased back pain. Introduced [employer] as part on ongoing HEP for strengthening and balance training to reduce fall risk - pt able to perform all activities, although majority of balance activities requiring "hold support" currently. Exercises and activities for home level performance highlighted with guidance provided for working on lessening need for UE support.    Comorbidities Recurrent falls - 3 in past 6 months, 7 major falls in past 5-6 yrs (4 concussions, finger fracture); costochondritis, cellulitis of R breast, fibromyalgia, chronic LBP since ~age 42, DDD, sciatica, osteoporosis, degenerative scoliosis, peripheral neuropathy, vertigo, R knee pain, CAD, COPD, HTN, h/o lumpectomy for R breast cancer 2013, ORIF R ankle s/p fall 2006    Rehab Potential Good  PT Frequency 2x / week   1-2x/wk   PT Duration 4 weeks    PT Treatment/Interventions ADLs/Self Care Home Management;Cryotherapy;Electrical Stimulation;Iontophoresis 85m/ml Dexamethasone;Moist Heat;Ultrasound;DME Instruction;Gait training;Stair training;Functional mobility training;Therapeutic activities;Therapeutic exercise;Balance training;Neuromuscular re-education;Patient/family education;Manual techniques;Passive range of motion;Dry needling;Taping;Vasopneumatic Device;Vestibular;Visual/perceptual remediation/compensation;Spinal Manipulations    PT Next Visit Plan review [place] Fall Prevention Program PRN; balance and dynamic gait training;lumbopelvic strengthening and stabilization - HEP review/update PRN; manual therapy & modalities for pain including ionto patch and/or taping as benefit noted for B hip/greater trochanter pain; balance training; vestibular reassessment & treatment as indicated    PT Home  Exercise Plan 6/24 - supported squat/eccentric stand>sit with yellow TB hip ABD isometric, seated yellow TB clam & marching; 7/23 - piriformis, ITB, hip flexor & HS stretches; 7/28 - yellow TB hook lying clam shell; 8/18 - chin tuck, UT & SCM stretches, scap retraction; 8/19 - seated horiz & vertical gaze stabilization; 8/31 - corner balance progression; 10/21 - heel/toe raises; 10/27 - [place]    Consulted and Agree with Plan of Care Patient           Patient will benefit from skilled therapeutic intervention in order to improve the following deficits and impairments:  Abnormal gait, Decreased activity tolerance, Decreased balance, Decreased coordination, Decreased endurance, Decreased knowledge of precautions, Decreased mobility, Decreased safety awareness, Decreased strength, Difficulty walking, Dizziness, Increased fascial restricitons, Increased muscle spasms, Impaired perceived functional ability, Impaired flexibility, Improper body mechanics, Postural dysfunction, Pain  Visit Diagnosis: Repeated falls  Unsteadiness on feet  Muscle weakness (generalized)  Other abnormalities of gait and mobility  Dizziness and giddiness  Chronic bilateral low back pain without sciatica     Problem List Patient Active Problem List   Diagnosis Date Noted  . Low back pain 09/08/2020  . OSA and COPD overlap syndrome ([place] 06/10/2020  . Serotonin withdrawal syndrome without complication 081/[id number] . Recurrent falls 05/02/2020  . Statin intolerance 02/29/2020  . RLS (restless legs syndrome) 11/09/2019  . Kidney stone 02/26/2019  . Recurrent sinusitis 02/25/2019  . Hx of colonic polyp 02/25/2019  . DDD (degenerative disc disease), cervical 09/17/2018  . Degenerative scoliosis 04/22/2018  . Primary insomnia 06/25/2017  . Chronic interstitial cystitis 02/01/2017  . Cough 01/09/2017  . Right arm pain 07/20/2016  . Deviated septum 05/12/2016  . Nasal turbinate hypertrophy  05/12/2016  . Dysphagia   . Allergic rhinitis 01/25/2016  . Other and combined forms of senile cataract 07/15/2015  . Dyspnea   . Left hip pain 04/30/2015  . Sciatica 04/30/2015  . Knee pain, right 04/30/2015  . Bilateral carpal tunnel syndrome 03/04/2015  . Hyperlipidemia 03/01/2015  . Pulmonary nodules 02/12/2015  . External hemorrhoids 02/05/2015  . Chronic night sweats 01/08/2015  . Atypical chest pain 01/01/2015  . Renal insufficiency 07/16/2014  . Memory loss 05/22/2014  . Peripheral neuropathy 05/22/2014  . Abdominal pain 10/26/2013  . Headache 10/13/2013  . Arthralgia 08/18/2013  . ANA positive 07/29/2013  . Depression 02/05/2013  . Family history of malignant hyperthermia 02/05/2013  . Malignant neoplasm of lower-outer quadrant of right breast of female, estrogen receptor positive ([place] 01/03/2013  . Splenic lesion 09/02/2012  . Asthma, allergic 08/05/2012  . GERD (gastroesophageal reflux disease) 06/03/2012  . Edema 04/08/2012  . Stricture and stenosis of esophagus 10/19/2011  . Functional constipation 07/21/2011  . Nonspecific (abnormal) findings on radiological and other examination of biliary tract 07/21/2011  . Osteoporosis 04/17/2011  . Leg pain 02/24/2011  . FIBROCYSTIC BREAST DISEASE, HX  OF 10/18/2010  . Essential hypertension 08/25/2010  . CAD in native artery 08/25/2010  . TOBACCO ABUSE, HX OF 08/25/2010  . GENERALIZED ANXIETY DISORDER 08/03/2010  . Fibromyalgia 01/11/2010    Percival Spanish, PT, MPT 10/20/2020, 1:38 PM  [facility] [address]  [place], [place], [address] Phone: [phone]   Fax:  [phone]  Name: [name] MRN: [id number] Date of Birth: 1946/08/25

## 2020-10-28 ENCOUNTER — Ambulatory Visit: Payer: Medicare Other | Admitting: Physical Therapy

## 2020-10-28 DIAGNOSIS — R531 Weakness: Secondary | ICD-10-CM | POA: Diagnosis not present

## 2020-10-28 DIAGNOSIS — G6289 Other specified polyneuropathies: Secondary | ICD-10-CM | POA: Diagnosis not present

## 2020-10-28 DIAGNOSIS — G629 Polyneuropathy, unspecified: Secondary | ICD-10-CM | POA: Diagnosis not present

## 2020-10-28 DIAGNOSIS — R269 Unspecified abnormalities of gait and mobility: Secondary | ICD-10-CM | POA: Diagnosis not present

## 2020-10-28 DIAGNOSIS — Z87891 Personal history of nicotine dependence: Secondary | ICD-10-CM | POA: Diagnosis not present

## 2020-11-01 DIAGNOSIS — G629 Polyneuropathy, unspecified: Secondary | ICD-10-CM | POA: Diagnosis not present

## 2020-11-04 ENCOUNTER — Ambulatory Visit: Payer: Medicare Other | Attending: Family Medicine | Admitting: Physical Therapy

## 2020-11-04 ENCOUNTER — Other Ambulatory Visit: Payer: Self-pay

## 2020-11-04 ENCOUNTER — Encounter: Payer: Self-pay | Admitting: Physical Therapy

## 2020-11-04 DIAGNOSIS — R2689 Other abnormalities of gait and mobility: Secondary | ICD-10-CM

## 2020-11-04 DIAGNOSIS — M545 Low back pain, unspecified: Secondary | ICD-10-CM | POA: Diagnosis not present

## 2020-11-04 DIAGNOSIS — M6281 Muscle weakness (generalized): Secondary | ICD-10-CM | POA: Diagnosis not present

## 2020-11-04 DIAGNOSIS — G8929 Other chronic pain: Secondary | ICD-10-CM | POA: Diagnosis not present

## 2020-11-04 DIAGNOSIS — R42 Dizziness and giddiness: Secondary | ICD-10-CM | POA: Insufficient documentation

## 2020-11-04 DIAGNOSIS — R2681 Unsteadiness on feet: Secondary | ICD-10-CM | POA: Diagnosis not present

## 2020-11-04 DIAGNOSIS — R296 Repeated falls: Secondary | ICD-10-CM | POA: Diagnosis not present

## 2020-11-04 NOTE — Therapy (Signed)
[facility] [address]  [place], [place], [address] Phone: [phone]   Fax:  [phone]  Physical Therapy Treatment  Patient Details  Name: [name] MRN: [id number] Date of Birth: [birth year] Referring Provider (PT): [name], MD   Encounter Date: 11/04/2020   PT End of Session - 11/04/20 1447    Visit Number 29    Number of Visits 35    Date for PT Re-Evaluation 11/11/20    Authorization Type [employer]    PT Start Time 1447    PT Stop Time 1552    PT Time Calculation (min) 65 min    Activity Tolerance Patient tolerated treatment well    Behavior During Therapy [facility] for tasks assessed/performed           Past Medical History:  Diagnosis Date  . Allergy   . Anemia   . Anxiety    past hx   . Arthritis   . Asthma   . Atrial flutter ([place])    past history- not current  . CAD (coronary artery disease) CARDIOLOGIST--  DR Angelena Form   mild non-obstructive cad  . Cancer ([place])    right  . Cataract    bilaterally removed   . Chronic constipation   . Chronic kidney disease    interstitial cystitis  . Concussion    x 3  . COPD (chronic obstructive pulmonary disease) ([place])   . Depression    past hx   . Family history of malignant hyperthermia    father had this  . Fibromyalgia   . Frequency of urination   . GERD (gastroesophageal reflux disease)   . H/O hiatal hernia   . History of basal cell carcinoma excision    X2  . History of breast cancer ONCOLOGIST-- DR Jana Hakim---  NO RECURRANCE   DX 07/2012;  LOW GRADE DCIS  ER+PR+  ----  S/P RIGHT LUMPECTOMY WITH NEGATIVE MARGINS/   RADIATION ENDED 11/2012  . History of chronic bronchitis   . History of colonic polyps   . History of tachycardia    CONTROLLED  WITH ATENOLOL  . Hyperlipidemia   . Hypertension   . Neuromuscular disorder (HCC)    fibromyalgia  . Osteoporosis 01/2019   T score -2.2 stable/improved from prior study  . Pelvic  pain   . Personal history of radiation therapy   . S/P radiation therapy 11/12/12 - 12/05/12   right Breast  . Sepsis ([place]) 2014   from UTI   . Sinus headache   . Urgency of urination     Past Surgical History:  Procedure Laterality Date  . [place] STUDY N/A 04/03/2016   Procedure: [place] STUDY;  Surgeon: Manus Gunning, MD;  Location: [facility];  Service: Gastroenterology;  Laterality: N/A;  . BREAST LUMPECTOMY Right 10-11-2012   W/ SLN BX  . CARDIAC CATHETERIZATION  09-13-2007  DR Lia Foyer   WELL-PRESERVED LVF/  DIFFUSE SCATTERED CORONARY CALCIFACATION AND ATHEROSCLEROSIS WITHOUT OBSTRUCTION  . CARDIAC CATHETERIZATION  08-04-2010  DR Angelena Form   NON-OBSTRUCTIVE CAD/  pLAD 40%/  oLAD 30%/  mLAD 30%/  pRCA 30%/  EF 60%  . CARDIOVASCULAR STRESS TEST  06-18-2012  DR McALHANY   LOW RISK NUCLEAR STUDY/  SMALL FIXED AREA OF MODERATELY DECREASED UPTAKE IN ANTEROSEPTAL WALL WHICH MAY BE ARTIFACTUAL/  NO ISCHEMIA/  EF 68%  . COLONOSCOPY  09-29-2010  . CYSTOSCOPY    . CYSTOSCOPY WITH HYDRODISTENSION  AND BIOPSY N/A 03/06/2014   Procedure: CYSTOSCOPY/HYDRODISTENSION/ INSTILATION OF MARCAINE AND PYRIDIUM;  Surgeon: Ailene Rud, MD;  Location: [facility];  Service: Urology;  Laterality: N/A;  . ESOPHAGEAL MANOMETRY N/A 04/03/2016   Procedure: ESOPHAGEAL MANOMETRY (EM);  Surgeon: Manus Gunning, MD;  Location: [facility];  Service: Gastroenterology;  Laterality: N/A;  . EXTRACORPOREAL SHOCK WAVE LITHOTRIPSY Left 02/06/2019   Procedure: EXTRACORPOREAL SHOCK WAVE LITHOTRIPSY (ESWL);  Surgeon: Kathie Rhodes, MD;  Location: [facility];  Service: Urology;  Laterality: Left;  . NASAL SINUS SURGERY  1985  . ORIF RIGHT ANKLE  FX  2006  . POLYPECTOMY    . REMOVAL VOCAL CORD CYST  FEB 2014  . RIGHT BREAST BX  08-23-2012  . RIGHT HAND SURGERY  X3  LAST ONE 2009   INCLUDES  ORIF RIGHT 5TH FINGER AND REVISION TWICE  . SKIN CANCER EXCISION    . TONSILLECTOMY AND  ADENOIDECTOMY  AGE 74  . TOTAL ABDOMINAL HYSTERECTOMY W/ BILATERAL SALPINGOOPHORECTOMY  1982   W/  APPENDECTOMY  . TRANSTHORACIC ECHOCARDIOGRAM  06-24-2012   GRADE I DIASTOLIC DYSFUNCTION/  EF 55-60%/  MILD MR  . UPPER GASTROINTESTINAL ENDOSCOPY      There were no vitals filed for this visit.   Subjective Assessment - 11/04/20 1457    Subjective Pt reports she the neuro specialist at [facility] last week - NCV and EMG revealed peripheral neuropathy in all extremities with numbness from malleoli down in B feet. She reports further testing pending with several blood tests still outstanding. Pt notes her back pain has flared up since testing, but prior to testing pain had remained well-controlled. New referral received for neuropathy - eval & treat for gait training/fall risk. Pt also reporting MD would like her to use a walker.    Pertinent History Recurrent falls - 3 in past 6 months, 7 major falls in past 5-6 yrs (4 concussions, finger fracture); costochondritis, cellulitis of R breast, fibromyalgia, chronic LBP since ~age 74, DDD, sciatica, osteoporosis, degenerative scoliosis, peripheral neuropathy, vertigo, R knee pain, CAD, COPD, HTN, h/o lumpectomy for R breast cancer 2013, ORIF R ankle s/p fall 2006    Patient Stated Goals "Be able to gain some strength in my legs to help with my back. To be able stand w/o feeling like I'm swaying and walk w/o tripping."    Currently in Pain? Yes    Pain Score 8    7-8/10   Pain Location Back    Pain Orientation Lower;Mid;Right    Pain Descriptors / Indicators Aching;Pressure    Pain Type Acute pain;Chronic pain;Neuropathic pain    Aggravating Factors  EMG & NCV testing last week                             [facility] Adult PT Treatment/Exercise - 11/04/20 1447      Transfers   Transfers Sit to Stand;Stand to Sit   with rollator   Sit to Stand 5: Supervision    Sit to Stand Details Verbal cues for safe use of DME/AE    Stand to Sit 5:  Supervision    Stand to Sit Details (indicate cue type and reason) Verbal cues for safe use of DME/AE      Ambulation/Gait   Ambulation/Gait Yes    Ambulation/Gait Assistance 5: Supervision    Ambulation Distance (Feet) 180 Feet    Assistive device Rollator    Gait Pattern Within Functional  Limits;Step-through pattern    Ambulation Surface Level;Indoor    Gait Comments Provided instruction in safe use of rollator for transfers and gait, emphasizing proper use of rollator brakes, correct hand placement during transfers, and good posture and alignment of rollator with step-through pattern targeting heel strike/foot placement between rear wheels of rollator for good rollator proximity.       Knee/Hip Exercises: Aerobic   Recumbent Bike L2 x 6 min      Modalities   Modalities Electrical Stimulation;Moist Heat      Moist Heat Therapy   Number Minutes Moist Heat 15 Minutes    Moist Heat Location Lumbar Spine      Electrical Stimulation   Electrical Stimulation Location B lumbar paraspinals and SIJ    Electrical Stimulation Action IFC    Electrical Stimulation Parameters 80-150 Hz, intensity to pt tolerance x 15'    Electrical Stimulation Goals Pain;Tone                    PT Short Term Goals - 06/22/20 1015      PT SHORT TERM GOAL #1   Title Patient will be independent with initial HEP    Status Achieved   06/22/20     PT SHORT TERM GOAL #2   Title Complete FGA assessment    Status Achieved   06/22/20            PT Long Term Goals - 11/04/20 1537      PT LONG TERM GOAL #1   Title Patient will be independent with ongoing/advanced HEP    Status Partially Met   10/14/20 - met for current HEP   Target Date 11/11/20      PT LONG TERM GOAL #2   Title Patient will demonstrate improved B LE strength to >/= 4/5 to 4+/5 for improved stability and ease of mobility    Status Partially Met    Target Date 11/11/20      PT LONG TERM GOAL #3   Title Patient will improve  Berg score to >/= 52/56 to improve safety stability with ADLs in standing and reduce risk for falls    Status Achieved   09/16/20 Merrilee Jansky = 52/56     PT LONG TERM GOAL #4   Title Patient will report no further instances of falls    Status Achieved   10/14/20 - no falls since 09/02/20     PT LONG TERM GOAL #5   Title Patient will improve FGA score to >/= 24/30 to improve gait stability and reduce risk for falls    Baseline Patient will improve FGA score to >/= 20/30 to improve gait stability and reduce risk for falls - met as of 09/16/20 & revised    Status Partially Met   10/11/20 - FGA = 21/30   Target Date 11/11/20      PT LONG TERM GOAL #6   Title Patient to report no dizziness remaining with bed mobility.    Status Achieved   08/24/20     PT LONG TERM GOAL #7   Title Patient will demonstrate safe gait pattern >500 ft with rollator on all surfaces to reduce risk for falls    Status New                 Plan - 11/04/20 1537    Clinical Impression Statement Daija returns to PT with new referral for neuropathy from Darryl Nestle, MD at the [facility] with orders  for eval & treat for gait training/fall risk - current POC already addressing h/o falls/fall risk but will incorporate training with RW as pt reports MD diagnosed B UE and LE neuropathy with numbness in B feet from malleoli distally and states MD wants her to use a walker. Discussed options for 2-wheel RW vs 4-wheel rollator style RW including pros and cons of each - rollator style RW determined to be best option with initial training provided in safe use of rollator during transfer and gait on level/indoor surfaces with inclement weather prohibiting training outdoors. Pt requiring repeated cues for proper use of brakes and hand placement during transfers but able to maintain good rollator proximity with only one minor loss of control while turning - further training indicated. Pt also noting exacerbation of her back  pain since the NCV and EMG testing at her MD appointment last week, therefore session concluded with estim and moist heat to help alleviate pain.    Comorbidities Recurrent falls - 3 in past 6 months, 7 major falls in past 5-6 yrs (4 concussions, finger fracture); costochondritis, cellulitis of R breast, fibromyalgia, chronic LBP since ~age 81, DDD, sciatica, osteoporosis, degenerative scoliosis, peripheral neuropathy, vertigo, R knee pain, CAD, COPD, HTN, h/o lumpectomy for R breast cancer 2013, ORIF R ankle s/p fall 2006    Rehab Potential Good    PT Frequency 2x / week   1-2x/wk   PT Duration 4 weeks    PT Treatment/Interventions ADLs/Self Care Home Management;Cryotherapy;Electrical Stimulation;Iontophoresis 4mg /ml Dexamethasone;Moist Heat;Ultrasound;DME Instruction;Gait training;Stair training;Functional mobility training;Therapeutic activities;Therapeutic exercise;Balance training;Neuromuscular re-education;Patient/family education;Manual techniques;Passive range of motion;Dry needling;Taping;Vasopneumatic Device;Vestibular;Visual/perceptual remediation/compensation;Spinal Manipulations    PT Next Visit Plan Recert - adding gait training with rollator and continuing to address fall risk with review of [place] Fall Prevention Program PRN; balance and dynamic gait training; lumbopelvic strengthening and stabilization - HEP review/update PRN; manual therapy & modalities for pain    PT Home Exercise Plan 6/24 - supported squat/eccentric stand>sit with yellow TB hip ABD isometric, seated yellow TB clam & marching; 7/23 - piriformis, ITB, hip flexor & HS stretches; 7/28 - yellow TB hook lying clam shell; 8/18 - chin tuck, UT & SCM stretches, scap retraction; 8/19 - seated horiz & vertical gaze stabilization; 8/31 - corner balance progression; 10/21 - heel/toe raises; 10/27 - [place] Fall Prevention Program    Consulted and Agree with Plan of Care Patient           Patient will benefit from skilled  therapeutic intervention in order to improve the following deficits and impairments:  Abnormal gait, Decreased activity tolerance, Decreased balance, Decreased coordination, Decreased endurance, Decreased knowledge of precautions, Decreased mobility, Decreased safety awareness, Decreased strength, Difficulty walking, Dizziness, Increased fascial restricitons, Increased muscle spasms, Impaired perceived functional ability, Impaired flexibility, Improper body mechanics, Postural dysfunction, Pain  Visit Diagnosis: Repeated falls  Unsteadiness on feet  Muscle weakness (generalized)  Other abnormalities of gait and mobility  Dizziness and giddiness  Chronic bilateral low back pain without sciatica     Problem List Patient Active Problem List   Diagnosis Date Noted  . Low back pain 09/08/2020  . OSA and COPD overlap syndrome ([place]) 06/10/2020  . Serotonin withdrawal syndrome without complication 37/85/8850  . Recurrent falls 05/02/2020  . Statin intolerance 02/29/2020  . RLS (restless legs syndrome) 11/09/2019  . Kidney stone 02/26/2019  . Recurrent sinusitis 02/25/2019  . Hx of colonic polyp 02/25/2019  . DDD (degenerative disc disease), cervical 09/17/2018  . Degenerative scoliosis 04/22/2018  .  Primary insomnia 06/25/2017  . Chronic interstitial cystitis 02/01/2017  . Cough 01/09/2017  . Right arm pain 07/20/2016  . Deviated septum 05/12/2016  . Nasal turbinate hypertrophy 05/12/2016  . Dysphagia   . Allergic rhinitis 01/25/2016  . Other and combined forms of senile cataract 07/15/2015  . Dyspnea   . Left hip pain 04/30/2015  . Sciatica 04/30/2015  . Knee pain, right 04/30/2015  . Bilateral carpal tunnel syndrome 03/04/2015  . Hyperlipidemia 03/01/2015  . Pulmonary nodules 02/12/2015  . External hemorrhoids 02/05/2015  . Chronic night sweats 01/08/2015  . Atypical chest pain 01/01/2015  . Renal insufficiency 07/16/2014  . Memory loss 05/22/2014  . Peripheral  neuropathy 05/22/2014  . Abdominal pain 10/26/2013  . Headache 10/13/2013  . Arthralgia 08/18/2013  . ANA positive 07/29/2013  . Depression 02/05/2013  . Family history of malignant hyperthermia 02/05/2013  . Malignant neoplasm of lower-outer quadrant of right breast of female, estrogen receptor positive ([place]) 01/03/2013  . Splenic lesion 09/02/2012  . Asthma, allergic 08/05/2012  . GERD (gastroesophageal reflux disease) 06/03/2012  . Edema 04/08/2012  . Stricture and stenosis of esophagus 10/19/2011  . Functional constipation 07/21/2011  . Nonspecific (abnormal) findings on radiological and other examination of biliary tract 07/21/2011  . Osteoporosis 04/17/2011  . Leg pain 02/24/2011  . FIBROCYSTIC BREAST DISEASE, HX OF 10/18/2010  . Essential hypertension 08/25/2010  . CAD in native artery 08/25/2010  . TOBACCO ABUSE, HX OF 08/25/2010  . GENERALIZED ANXIETY DISORDER 08/03/2010  . Fibromyalgia 01/11/2010    Percival Spanish, PT, MPT 11/04/2020, 7:46 PM  [facility] [address]  [place], [place], [address] Phone: [phone]   Fax:  [phone]  Name: DESTYNEE STRINGFELLOW MRN: [id number] Date of Birth: 1946/06/18

## 2020-11-09 ENCOUNTER — Encounter: Payer: Medicare Other | Admitting: Gynecology

## 2020-11-09 ENCOUNTER — Encounter: Payer: Medicare Other | Admitting: Obstetrics and Gynecology

## 2020-11-10 ENCOUNTER — Ambulatory Visit: Payer: Medicare Other | Admitting: Gastroenterology

## 2020-11-10 ENCOUNTER — Encounter: Payer: Self-pay | Admitting: Gastroenterology

## 2020-11-10 VITALS — BP 90/60 | HR 72 | Ht 63.5 in | Wt 135.4 lb

## 2020-11-10 DIAGNOSIS — R140 Abdominal distension (gaseous): Secondary | ICD-10-CM

## 2020-11-10 DIAGNOSIS — K219 Gastro-esophageal reflux disease without esophagitis: Secondary | ICD-10-CM | POA: Diagnosis not present

## 2020-11-10 DIAGNOSIS — R194 Change in bowel habit: Secondary | ICD-10-CM | POA: Diagnosis not present

## 2020-11-10 MED ORDER — POLYETHYLENE GLYCOL 3350 17 G PO PACK: 17.0000 g | PACK | Freq: Every day | ORAL | 0 refills | Status: DC

## 2020-11-10 MED ORDER — DEXLANSOPRAZOLE 60 MG PO CPDR: 60.0000 mg | DELAYED_RELEASE_CAPSULE | Freq: Every day | ORAL | 0 refills | Status: DC

## 2020-11-10 NOTE — Patient Instructions (Signed)
If you are age 75 or older, your body mass index should be between 23-30. Your Body mass index is 23.6 kg/m. If this is out of the aforementioned range listed, please consider follow up with your Primary Care Provider.  If you are age 40 or younger, your body mass index should be between 19-25. Your Body mass index is 23.6 kg/m. If this is out of the aformentioned range listed, please consider follow up with your Primary Care Provider.   Please purchase the following medications over the counter and take as directed: Miralax: Take once daily and titrate as needed  We have given you samples of the following medication to take: Dexilant 60 mg: Take once daily  Discontinue omeprazole  Hold Bentyl for now.  We are giving you a Low [place]od-Map diet to follow.  Thank you for entrusting me with your care and for choosing [facility], Dr. La Fontaine Cellar

## 2020-11-10 NOTE — Progress Notes (Signed)
HPI :  74 year old female, here for a follow-up visit for altered bowel habits, bloating gas, GERD.  She was seen in our office on September 22 by Carl Best.  At that point time she had actually reported ongoing abdominal discomfort and loose stools for the past 8 months or so.  She had a normal blood count. Had a CT scan on August 13 which did not show any problems with her bowels.  She had a serologic test for celiac disease which was negative.  She was started on probiotic twice daily.  She was sent for a GI pathogen panel but never submitted that it appears.  She was also given Bentyl to take as needed.  She states that the Bentyl has completely resolved her abdominal pain/cramping.  She states when she gets this it reliably relieves it quite well.  The problem with the Bentyl is that when she takes it it causes uncontrollable diarrhea.  She states in general her diarrhea has resolved and she does not have it unless she takes the Bentyl.  In fact she now has constipation which she has had in the past.  She has been using Bentyl as needed to treat the constipation, however finds this regimen challenging as again she has a lot of urgency with stooling when she takes the Bentyl.  She has changed her diet significantly which she thinks has helped the diarrhea.  She is eating much more fruits and vegetables.  Along with this she states she has had significant amount of gas and bloating which has bothered her.  He has tried a variety of over-the-counter regimens for her gas which has not helped at all.  Discussed options for this.  She is not seeing any blood in the stool.  She had a colonoscopy about 2 years ago which did not show anything concerning.  She otherwise has had longstanding GERD, initially was treated with low-dose omeprazole which worked okay, eventually required higher dosing to control symptoms.  She has had an EGD in the past without evidence of Barrett's esophagus, done in  2019.  She states despite using omeprazole twice daily she is having a lot of problems with ongoing pyrosis and regurgitation.  She tries to avoid eating prior to bedtime but still has nocturnal symptoms as well as symptoms postprandially during the day.  She inquires about other options.  Prior workup: Abdominal/Pelvic CT with contrast 08/06/2020: Lower chest: Normal heart size. Lung bases are clear. No pleural effusion. Hepatobiliary: Liver is normal in size and contour. Subcentimeter too small to characterize low-attenuation lesions within the hepatic dome and left hepatic lobe. Gallbladder is unremarkable. No intrahepatic or extrahepatic biliary ductal dilatation. Pancreas: Unremarkable Spleen: Unremarkable Adrenals/Urinary Tract: Normal adrenal glands. Kidneys enhance symmetrically with contrast. No hydronephrosis. Urinary bladder is unremarkable. Stomach/Bowel: Small hiatal hernia. Normal morphology of the stomach. No evidence for bowel obstruction. No free fluid or free intraperitoneal air. Vascular/Lymphatic: Normal caliber abdominal aorta. Peripheral calcified atherosclerotic plaque. No retroperitoneal lymphadenopathy. Reproductive: Prior hysterectomy. Musculoskeletal: Lower thoracic and lumbar spine degenerative changes. No aggressive or acute appearing osseous lesions.   EGD 12/24/2018:  - Esophagogastric landmarks identified. - Normal esophagus - empiric dilation performed to 62mm with small mucosal wrent at the UES - Erythematous mucosa in the gastric body. - Normal duodenal bulb and second portion of the duodenum. - Biopsies were taken with a cold forceps for Helicobacter pylori testing.  Colonoscopy 12/24/2018: - One 4 mm polyp in the ascending colon, removed with  a cold snare. Resected and retrieved. - One 4 mm polyp in the transverse colon, removed with a cold snare. Resected and retrieved. - One 4 mm polyp in the descending colon, removed with a cold  snare. Resected and retrieved. - One 3 mm polyp in the sigmoid colon, removed with a cold snare. Resected and retrieved. - Anal papilla(e) were hypertrophied. Biopsied. - Tortuous colon, which prohibited ileal intubation. - Internal hemorrhoids. - Mostly adequate prep, fair in the cecum. - The examination was otherwise normal. No evidence of IBD or fistula. Suspect rectal bleeding is due to hemorrhoids in the setting of constipation.  1. Surgical [P], gastric antrum and gastric body - MILD REACTIVE GASTROPATHY. - NEGATIVE FOR HELICOBACTER PYLORI. - NO INTESTINAL METAPLASIA, DYSPLASIA, OR MALIGNANCY. 2. Surgical [P], transverse, ascending, sigmoid, polyp (2) - TUBULAR ADENOMA. - SESSILE SERRATED POLYP WITHOUT DYSPLASIA (X2 FRAGMENTS). - NO HIGH GRADE DYSPLASIA OR MALIGNANCY. 3. Surgical [P], anal papilla, polyp - POLYPOID FRAGMENT OF BENIGN SQUAMOUS MUCOSA. - NO DYSPLASIA OR MALIGNANCY.   Past Medical History:  Diagnosis Date  . Allergy   . Anemia   . Anxiety    past hx   . Arthritis   . Asthma   . Atrial flutter ([place])    past history- not current  . CAD (coronary artery disease) CARDIOLOGIST--  DR Angelena Form   mild non-obstructive cad  . Cancer ([place])    right  . Cataract    bilaterally removed   . Chronic constipation   . Chronic kidney disease    interstitial cystitis  . Concussion    x 3  . COPD (chronic obstructive pulmonary disease) ([place])   . Depression    past hx   . Family history of malignant hyperthermia    father had this  . Fibromyalgia   . Frequency of urination   . GERD (gastroesophageal reflux disease)   . H/O hiatal hernia   . History of basal cell carcinoma excision    X2  . History of breast cancer ONCOLOGIST-- DR Jana Hakim---  NO RECURRANCE   DX 07/2012;  LOW GRADE DCIS  ER+PR+  ----  S/P RIGHT LUMPECTOMY WITH NEGATIVE MARGINS/   RADIATION ENDED 11/2012  . History of chronic bronchitis   . History of colonic polyps   . History of tachycardia      CONTROLLED  WITH ATENOLOL  . Hyperlipidemia   . Hypertension   . Neuromuscular disorder ([facility])    fibromyalgia  . Neuropathy   . Osteoporosis 01/2019   T score -2.2 stable/improved from prior study  . Pelvic pain   . Personal history of radiation therapy   . S/P radiation therapy 11/12/12 - 12/05/12   right Breast  . Sepsis ([place]) 2014   from UTI   . Sinus headache   . Urgency of urination      Past Surgical History:  Procedure Laterality Date  . [place] STUDY N/A 04/03/2016   Procedure: [place] STUDY;  Surgeon: Manus Gunning, MD;  Location: [facility];  Service: Gastroenterology;  Laterality: N/A;  . BREAST LUMPECTOMY Right 10-11-2012   W/ SLN BX  . CARDIAC CATHETERIZATION  09-13-2007  DR Lia Foyer   WELL-PRESERVED LVF/  DIFFUSE SCATTERED CORONARY CALCIFACATION AND ATHEROSCLEROSIS WITHOUT OBSTRUCTION  . CARDIAC CATHETERIZATION  08-04-2010  DR Angelena Form   NON-OBSTRUCTIVE CAD/  pLAD 40%/  oLAD 30%/  mLAD 30%/  pRCA 30%/  EF 60%  . CARDIOVASCULAR STRESS TEST  06-18-2012  DR Angelena Form  LOW RISK NUCLEAR STUDY/  SMALL FIXED AREA OF MODERATELY DECREASED UPTAKE IN ANTEROSEPTAL WALL WHICH MAY BE ARTIFACTUAL/  NO ISCHEMIA/  EF 68%  . COLONOSCOPY  09-29-2010  . CYSTOSCOPY    . CYSTOSCOPY WITH HYDRODISTENSION AND BIOPSY N/A 03/06/2014   Procedure: CYSTOSCOPY/HYDRODISTENSION/ INSTILATION OF MARCAINE AND PYRIDIUM;  Surgeon: Ailene Rud, MD;  Location: [facility];  Service: Urology;  Laterality: N/A;  . ESOPHAGEAL MANOMETRY N/A 04/03/2016   Procedure: ESOPHAGEAL MANOMETRY (EM);  Surgeon: Manus Gunning, MD;  Location: [facility];  Service: Gastroenterology;  Laterality: N/A;  . EXTRACORPOREAL SHOCK WAVE LITHOTRIPSY Left 02/06/2019   Procedure: EXTRACORPOREAL SHOCK WAVE LITHOTRIPSY (ESWL);  Surgeon: Kathie Rhodes, MD;  Location: [facility];  Service: Urology;  Laterality: Left;  . NASAL SINUS SURGERY  1985  . ORIF RIGHT ANKLE  FX  2006  .  POLYPECTOMY    . REMOVAL VOCAL CORD CYST  FEB 2014  . RIGHT BREAST BX  08-23-2012  . RIGHT HAND SURGERY  X3  LAST ONE 2009   INCLUDES  ORIF RIGHT 5TH FINGER AND REVISION TWICE  . SKIN CANCER EXCISION    . TONSILLECTOMY AND ADENOIDECTOMY  AGE 17  . TOTAL ABDOMINAL HYSTERECTOMY W/ BILATERAL SALPINGOOPHORECTOMY  1982   W/  APPENDECTOMY  . TRANSTHORACIC ECHOCARDIOGRAM  06-24-2012   GRADE I DIASTOLIC DYSFUNCTION/  EF 55-60%/  MILD MR  . UPPER GASTROINTESTINAL ENDOSCOPY     Family History  Problem Relation Age of Onset  . Rectal cancer Mother   . Colon cancer Mother   . Pancreatic cancer Mother   . Diabetes Mother   . Breast cancer Mother 72  . Breast cancer Maternal Aunt        breast  . Irritable bowel syndrome Son   . Heart disease Son        CAD, MV replacement  . Allergic Disorder Daughter   . Colon cancer Father   . Colon polyps Father   . Diabetes Father   . Stroke Father   . Heart disease Father        CHF  . Hyperlipidemia Father   . Hypertension Father   . Arthritis Father   . Breast cancer Paternal Aunt   . Breast cancer Paternal Aunt   . Arthritis Paternal Uncle   . Allergic Disorder Daughter   . Esophageal cancer Neg Hx   . Stomach cancer Neg Hx    Social History   Tobacco Use  . Smoking status: Former Smoker    Packs/day: 1.00    Years: 15.00    Pack years: 15.00    Types: Cigarettes    Quit date: 05/27/2005    Years since quitting: 15.4  . Smokeless tobacco: Never Used  Vaping Use  . Vaping Use: Never used  Substance Use Topics  . Alcohol use: No    Alcohol/week: 0.0 standard drinks  . Drug use: No   Current Outpatient Medications  Medication Sig Dispense Refill  . acetaminophen (TYLENOL) 500 MG tablet Take 1,000 mg by mouth every 6 (six) hours as needed for mild pain or headache.     . albuterol (PROVENTIL HFA;VENTOLIN HFA) 108 (90 BASE) MCG/ACT inhaler Inhale 2 puffs into the lungs every 4 (four) hours as needed. 1 Inhaler 5  . amLODipine  (NORVASC) 10 MG tablet Take 1 tablet (10 mg total) by mouth daily. 90 tablet 3  . aspirin EC 81 MG tablet Take 1 tablet (81 mg total) by mouth daily. 90 tablet  3  . atenolol (TENORMIN) 50 MG tablet Take 1 tablet (50 mg total) by mouth 2 (two) times daily. 180 tablet 1  . denosumab (PROLIA) 60 MG/ML SOSY injection Inject 60 mg into the skin every 6 (six) months.    . dicyclomine (BENTYL) 10 MG capsule Take 1 capsule (10 mg total) by mouth every 8 (eight) hours. (Patient taking differently: Take 10 mg by mouth every other day. ) 90 capsule 1  . DULoxetine (CYMBALTA) 60 MG capsule Take 2 capsules (120 mg total) by mouth daily. 180 capsule 1  . Estradiol 10 MCG TABS vaginal tablet Place 1 tablet (10 mcg total) vaginally 2 (two) times a week. 24 tablet 4  . fluticasone (FLONASE) 50 MCG/ACT nasal spray Use 2 spray(s) in each nostril once daily 16 g 0  . furosemide (LASIX) 20 MG tablet Take 1 tablet (20 mg total) by mouth as needed for edema. 90 tablet 3  . gabapentin (NEURONTIN) 300 MG capsule Take 300 mg by mouth. 1cap in the morning, 1cap at noon, and 2caps at bedtime    . hyoscyamine (LEVSIN SL) 0.125 MG SL tablet Place 1 tablet (0.125 mg total) under the tongue every 4 (four) hours as needed. 30 tablet 0  . loratadine (CLARITIN) 10 MG tablet Take 1 tablet (10 mg total) by mouth daily. 30 tablet 11  . losartan (COZAAR) 100 MG tablet Take 1 tablet by mouth once daily 90 tablet 3  . mupirocin ointment (BACTROBAN) 2 % Apply thin film twice daily. 22 g 0  . nitroGLYCERIN (NITROSTAT) 0.4 MG SL tablet Place 1 tablet (0.4 mg total) under the tongue every 5 (five) minutes as needed for chest pain. 25 tablet 1  . omeprazole (PRILOSEC) 40 MG capsule Take 1 capsule (40 mg total) by mouth 2 (two) times daily. 180 capsule 3  . pentosan polysulfate (ELMIRON) 100 MG capsule Take 1 capsule (100 mg total) by mouth 3 (three) times daily. Reported on 01/05/2016 (Patient taking differently: Take 100 mg by mouth 3 (three)  times daily with meals as needed. Reported on 01/05/2016) 90 capsule 11  . pramipexole (MIRAPEX) 0.125 MG tablet Take 1-2 tablets (0.125-0.25 mg total) by mouth in the morning and at bedtime. 60 tablet 5  . REPATHA SURECLICK 503 MG/ML SOAJ INJECT 140 MG SUBCUTANEOUSLY EVERY 14 DAYS 2 pen 11  . tiZANidine (ZANAFLEX) 2 MG tablet Take 0.5-2 tablets (1-4 mg total) by mouth every 8 (eight) hours as needed for muscle spasms. 40 tablet 1  . traMADol (ULTRAM) 50 MG tablet Take 50-100 mg by mouth 2 (two) times daily as needed.    . traZODone (DESYREL) 100 MG tablet TAKE 1 TABLET BY MOUTH AT BEDTIME 90 tablet 0   No current facility-administered medications for this visit.   Facility-Administered Medications Ordered in Other Visits  Medication Dose Route Frequency Provider Last Rate Last Admin  . bupivacaine (MARCAINE) 0.5 % 10 mL, triamcinolone acetonide (KENALOG-40) 40 mg injection   Subcutaneous Once Carolan Clines, MD      . bupivacaine (MARCAINE) 0.5 % 15 mL, phenazopyridine (PYRIDIUM) 400 mg bladder mixture   Bladder Instillation Once Carolan Clines, MD       Allergies  Allergen Reactions  . Clindamycin Hcl Shortness Of Breath and Rash  . Penicillins Anaphylaxis  . Prednisone Shortness Of Breath and Rash  . Rosuvastatin Anaphylaxis  . Baclofen Other (See Comments) and Rash  . Cortisone Other (See Comments)  . Lincomycin Other (See Comments)  . Codeine Hives and Other (  See Comments)    headache  . Erythromycin Base Other (See Comments)    other  . Fluzone [Flu Virus Vaccine] Other (See Comments)    Local reaction at the site  . Haemophilus Influenzae Other (See Comments)    Local reaction at the site Local reaction at the site  . Latex Hives  . Pentazocine Lactate Other (See Comments)    HALLUCINATION  . Pneumococcal Vaccine Polyvalent Hives, Swelling and Other (See Comments)    REACTION: redness, swelling, and hives at injection site  . Tamoxifen Nausea And Vomiting and  Other (See Comments)    HEADACHE     Review of Systems: All systems reviewed and negative except where noted in HPI.   Lab Results  Component Value Date   WBC 8.8 09/15/2020   HGB 12.8 09/15/2020   HCT 39.0 09/15/2020   MCV 92.4 09/15/2020   PLT 281.0 09/15/2020    Lab Results  Component Value Date   CREATININE 1.03 09/15/2020   BUN 13 09/15/2020   NA 139 09/15/2020   K 4.8 09/15/2020   CL 104 09/15/2020   CO2 32 09/15/2020    Lab Results  Component Value Date   ALT 11 09/15/2020   AST 17 09/15/2020   ALKPHOS 104 09/15/2020   BILITOT 0.5 09/15/2020     Physical Exam: BP 90/60 (BP Location: Left Arm, Patient Position: Sitting, Cuff Size: Normal)   Pulse 72   Ht 5' 3.5" (1.613 m)   Wt 135 lb 6 oz (61.4 kg)   BMI 23.60 kg/m  Constitutional: Pleasant,well-developed, female in no acute distress. Abdominal: Soft, nondistended, nontender.There are no masses palpable.  Extremities: no edema Lymphadenopathy: No cervical adenopathy noted. Neurological: Alert and oriented to person place and time. Skin: Skin is warm and dry. No rashes noted. Psychiatric: Normal mood and affect. Behavior is normal.   ASSESSMENT AND PLAN: 74 year old female here for reassessment the following:  Altered bowel habits / increased bloating and gas - as above, she had problems with persistent loose stools for several months, she changed her diet significantly which has helped with this, diarrhea at baseline has resolved.  Now having constipation with significant gas and bloating.  She takes Bentyl as needed for cramps which works quite well for this, however she has very strong urgency and loose stools whenever she takes Bentyl.  Discussed options with her.  Would treat her constipation light of her increased bloating and gas with this, will start MiraLAX once daily and can titrate up to twice daily and increase further as needed for goal bowel movement or 2 every day.  Hopefully with a better  bowel regimen she will have less gas.  I wonder if her increased bloating and gas could be a change of her diet to a lot of fruits and vegetables which can do this.  I gave her a copy of a low FODMAP diet to see if that will help at all as well, will try to avoid any high risk or trigger foods which can do this.  She will try to hold off on the Bentyl and see how she does on this regimen.  If her significant gas and bloating persist we can consider an empiric trial of rifaximin but hopefully will respond to measures as outlined above.  She will contact me in a few weeks if no improvement.  GERD - prior EGD without any high risk pathology.  Previously was doing quite well on omeprazole, unfortunately despite escalating doses having  now persistent symptoms despite this.  Discussed options with her.  We will switch her to a trial of Dexilant 60 mg a day for 2 weeks and see if that helps.  Free sample provided.  If this works for her we can refill it, if not we will need to consider other testing. Long-term want to use the lowest dose of PPI needed to control symptoms due to potential risks.  [place] Cellar, MD [facility]

## 2020-11-11 ENCOUNTER — Ambulatory Visit: Payer: Medicare Other | Admitting: Physical Therapy

## 2020-11-12 NOTE — Telephone Encounter (Signed)
[place] 06/30/2020 NEXT INJECTION 02/01/2021

## 2020-11-15 ENCOUNTER — Other Ambulatory Visit: Payer: Self-pay | Admitting: Family Medicine

## 2020-11-24 MED ORDER — REPATHA SURECLICK 140 MG/ML ~~LOC~~ SOAJ: 140.0000 mg | SUBCUTANEOUS | 0 refills | Status: DC

## 2020-11-24 NOTE — Telephone Encounter (Signed)
Patient calling the office for samples of medication:   1.  What medication and dosage are you requesting samples for? REPATHA SURECLICK 669 MG/ML SOAJ  2.  Are you currently out of this medication? Yes

## 2020-11-24 NOTE — Addendum Note (Signed)
Addended by: Harrington Challenger on: 11/24/2020 02:38 PM   Modules accepted: Orders

## 2020-11-26 ENCOUNTER — Ambulatory Visit: Payer: Medicare Other | Admitting: Neurology

## 2020-12-02 DIAGNOSIS — E538 Deficiency of other specified B group vitamins: Secondary | ICD-10-CM | POA: Diagnosis not present

## 2020-12-02 DIAGNOSIS — G629 Polyneuropathy, unspecified: Secondary | ICD-10-CM | POA: Diagnosis not present

## 2020-12-02 DIAGNOSIS — G894 Chronic pain syndrome: Secondary | ICD-10-CM | POA: Diagnosis not present

## 2020-12-02 DIAGNOSIS — Z79891 Long term (current) use of opiate analgesic: Secondary | ICD-10-CM | POA: Diagnosis not present

## 2020-12-06 ENCOUNTER — Encounter: Payer: Self-pay | Admitting: Physical Therapy

## 2020-12-06 ENCOUNTER — Other Ambulatory Visit: Payer: Self-pay

## 2020-12-06 ENCOUNTER — Ambulatory Visit: Payer: Medicare Other | Attending: Family Medicine | Admitting: Physical Therapy

## 2020-12-06 DIAGNOSIS — R2681 Unsteadiness on feet: Secondary | ICD-10-CM | POA: Diagnosis not present

## 2020-12-06 DIAGNOSIS — R42 Dizziness and giddiness: Secondary | ICD-10-CM | POA: Diagnosis not present

## 2020-12-06 DIAGNOSIS — G44311 Acute post-traumatic headache, intractable: Secondary | ICD-10-CM | POA: Diagnosis not present

## 2020-12-06 DIAGNOSIS — M6281 Muscle weakness (generalized): Secondary | ICD-10-CM | POA: Diagnosis not present

## 2020-12-06 DIAGNOSIS — R29818 Other symptoms and signs involving the nervous system: Secondary | ICD-10-CM

## 2020-12-06 DIAGNOSIS — G8929 Other chronic pain: Secondary | ICD-10-CM | POA: Diagnosis not present

## 2020-12-06 DIAGNOSIS — R2689 Other abnormalities of gait and mobility: Secondary | ICD-10-CM | POA: Insufficient documentation

## 2020-12-06 DIAGNOSIS — R296 Repeated falls: Secondary | ICD-10-CM

## 2020-12-06 DIAGNOSIS — M545 Low back pain, unspecified: Secondary | ICD-10-CM | POA: Diagnosis not present

## 2020-12-06 NOTE — Therapy (Signed)
[facility] [place] [facility] [address]  [place], [place], [address] Phone: [phone]   Fax:  [phone]  Physical Therapy Re-Eval / Recert  Patient Details  Name: [name] MRN: [id number] Date of Birth: 03-17-1946 Referring Provider (PT): [name], MD & Darryl Nestle, MD   Encounter Date: 12/06/2020   PT End of Session - 12/06/20 0938    Visit Number 30    Number of Visits 61    Date for PT Re-Evaluation 01/31/21    Authorization Type [employer]    PT Start Time [phone]    PT Stop Time 1029    PT Time Calculation (min) 51 min    Activity Tolerance Patient tolerated treatment well    Behavior During Therapy [facility] for tasks assessed/performed           Past Medical History:  Diagnosis Date  . Allergy   . Anemia   . Anxiety    past hx   . Arthritis   . Asthma   . Atrial flutter ([place])    past history- not current  . CAD (coronary artery disease) CARDIOLOGIST--  DR Angelena Form   mild non-obstructive cad  . Cancer ([place])    right  . Cataract    bilaterally removed   . Chronic constipation   . Chronic kidney disease    interstitial cystitis  . Concussion    x 3  . COPD (chronic obstructive pulmonary disease) ([place])   . Depression    past hx   . Family history of malignant hyperthermia    father had this  . Fibromyalgia   . Frequency of urination   . GERD (gastroesophageal reflux disease)   . H/O hiatal hernia   . History of basal cell carcinoma excision    X2  . History of breast cancer ONCOLOGIST-- DR Jana Hakim---  NO RECURRANCE   DX 07/2012;  LOW GRADE DCIS  ER+PR+  ----  S/P RIGHT LUMPECTOMY WITH NEGATIVE MARGINS/   RADIATION ENDED 11/2012  . History of chronic bronchitis   . History of colonic polyps   . History of tachycardia    CONTROLLED  WITH ATENOLOL  . Hyperlipidemia   . Hypertension   . Neuromuscular disorder (HCC)    fibromyalgia  . Neuropathy   . Osteoporosis 01/2019   T score -2.2  stable/improved from prior study  . Pelvic pain   . Personal history of radiation therapy   . S/P radiation therapy 11/12/12 - 12/05/12   right Breast  . Sepsis ([place]) 2014   from UTI   . Sinus headache   . Urgency of urination     Past Surgical History:  Procedure Laterality Date  . [place] STUDY N/A 04/03/2016   Procedure: [place] STUDY;  Surgeon: Manus Gunning, MD;  Location: [facility];  Service: Gastroenterology;  Laterality: N/A;  . BREAST LUMPECTOMY Right 10-11-2012   W/ SLN BX  . CARDIAC CATHETERIZATION  09-13-2007  DR Lia Foyer   WELL-PRESERVED LVF/  DIFFUSE SCATTERED CORONARY CALCIFACATION AND ATHEROSCLEROSIS WITHOUT OBSTRUCTION  . CARDIAC CATHETERIZATION  08-04-2010  DR Angelena Form   NON-OBSTRUCTIVE CAD/  pLAD 40%/  oLAD 30%/  mLAD 30%/  pRCA 30%/  EF 60%  . CARDIOVASCULAR STRESS TEST  06-18-2012  DR McALHANY   LOW RISK NUCLEAR STUDY/  SMALL FIXED AREA OF MODERATELY DECREASED UPTAKE IN ANTEROSEPTAL WALL WHICH MAY BE ARTIFACTUAL/  NO ISCHEMIA/  EF 68%  . COLONOSCOPY  09-29-2010  .  CYSTOSCOPY    . CYSTOSCOPY WITH HYDRODISTENSION AND BIOPSY N/A 03/06/2014   Procedure: CYSTOSCOPY/HYDRODISTENSION/ INSTILATION OF MARCAINE AND PYRIDIUM;  Surgeon: Ailene Rud, MD;  Location[facility];  Service: Urology;  Laterality: N/A;  . ESOPHAGEAL MANOMETRY N/A 04/03/2016   Procedure: ESOPHAGEAL MANOMETRY (EM);  Surgeon: Manus Gunning, MD;  Location: [facility];  Service: Gastroenterology;  Laterality: N/A;  . EXTRACORPOREAL SHOCK WAVE LITHOTRIPSY Left 02/06/2019   Procedure: EXTRACORPOREAL SHOCK WAVE LITHOTRIPSY (ESWL);  Surgeon: Kathie Rhodes, MD;  Location: [facility];  Service: Urology;  Laterality: Left;  . NASAL SINUS SURGERY  1985  . ORIF RIGHT ANKLE  FX  2006  . POLYPECTOMY    . REMOVAL VOCAL CORD CYST  FEB 2014  . RIGHT BREAST BX  08-23-2012  . RIGHT HAND SURGERY  X3  LAST ONE 2009   INCLUDES  ORIF RIGHT 5TH FINGER AND REVISION TWICE  .  SKIN CANCER EXCISION    . TONSILLECTOMY AND ADENOIDECTOMY  AGE 55  . TOTAL ABDOMINAL HYSTERECTOMY W/ BILATERAL SALPINGOOPHORECTOMY  1982   W/  APPENDECTOMY  . TRANSTHORACIC ECHOCARDIOGRAM  06-24-2012   GRADE I DIASTOLIC DYSFUNCTION/  EF 55-60%/  MILD MR  . UPPER GASTROINTESTINAL ENDOSCOPY      There were no vitals filed for this visit.   Subjective Assessment - 12/06/20 0942    Subjective Pt returning for 1st time in >30 days due to a death in the family and states she is still grieving the loss of her cousin (feels like she lost a sister). Pt reports only changes in her meds are that the neurologist wants her to take vitamin B12 and start taking her Tramadol more regularly. Pt reports she has been mostly doing well getting around with the cane but did report a stumble yesterday where she had to catch herself on the fireplace which jarred her back. She states she continues to have chronic headaches since her fall on 08/28/20 where she hit her head on the pavement. Also notes another vertigo episode last week lasting ~3 days. Fidela admits to poor compliance with HEP since last PT visit.    Pertinent History Recurrent falls - 3 in past 6 months, 7 major falls in past 5-6 yrs (4 concussions, finger fracture); costochondritis, cellulitis of R breast, fibromyalgia, chronic LBP since ~age 36, DDD, sciatica, osteoporosis, degenerative scoliosis, peripheral neuropathy, vertigo, R knee pain, CAD, COPD, HTN, h/o lumpectomy for R breast cancer 2013, ORIF R ankle s/p fall 2006    Patient Stated Goals "Be able to gain some strength in my legs to help with my back. To be able stand w/o feeling like I'm swaying and walk w/o tripping."    Currently in Pain? Yes    Pain Score 7     Pain Location Back    Pain Orientation Lower;Mid;Upper    Pain Descriptors / Indicators Burning;Shooting;Sharp    Pain Type Chronic pain    Pain Frequency Constant    Effect of Pain on Daily Activities difficulty taking a deep breath     Pain Score 7   6-7/10   Pain Location Head   temporal, parietal and occipital   Pain Orientation Left    Pain Descriptors / Indicators Sore;Headache    Pain Type Acute pain;Chronic pain    Pain Onset More than a month ago   since fall 4 months ago   Pain Frequency Constant    Effect of Pain on Daily Activities causes "foggy" vision  Pain Score 6    Pain Location Leg   & arms   Pain Orientation Left;Right   LE R>L, UE L>R   Pain Descriptors / Indicators Constant;Tingling;Burning    Pain Type Neuropathic pain    Pain Radiating Towards legs - knee down; arms - elbows to hands    Pain Frequency Constant              [facility] PT Assessment - 12/06/20 0938      Assessment   Medical Diagnosis Multiple falls, axonal neuropathy    Referring Provider (PT) [name], MD & Darryl Nestle, MD    Next MD Visit 01/28/21 - Dr. Patsey Berthold & 03/08/21 - Dr. Charlett Blake      Precautions   Precautions Fall      Balance Screen   Has the patient fallen in the past 6 months Yes    How many times? 5   8 or 9 since the 1st of the year   Has the patient had a decrease in activity level because of a fear of falling?  Yes    Is the patient reluctant to leave their home because of a fear of falling?  Yes      Prior Function   Level of Independence Independent    Vocation Retired    [employer] retired Therapist, sports    Leisure gardening, reading, sewing, being outdoors (feels uncomfortable in back yard due to slope), walking 1/2 mile 2x/wk      Sensation   Light Touch Impaired by gross assessment    Additional Comments neuropathy      Strength   Right Hip Flexion 3+/5    Right Hip Extension 3-/5    Right Hip External Rotation  3+/5    Right Hip Internal Rotation 4-/5    Right Hip ABduction 4-/5    Right Hip ADduction 3-/5    Left Hip Flexion 3+/5    Left Hip Extension 3-/5    Left Hip External Rotation 3+/5    Left Hip Internal Rotation 4-/5    Left Hip ABduction 3+/5    Left Hip ADduction  3+/5    Right Knee Flexion 4/5    Right Knee Extension 4+/5    Left Knee Flexion 4/5    Left Knee Extension 4+/5    Right Ankle Dorsiflexion 4-/5    Right Ankle Plantar Flexion 3+/5    Right Ankle Inversion 3+/5    Right Ankle Eversion 4-/5    Left Ankle Dorsiflexion 3+/5    Left Ankle Plantar Flexion 3+/5    Left Ankle Inversion 3+/5    Left Ankle Eversion 4-/5                         [facility] Adult PT Treatment/Exercise - 12/06/20 0938      Lumbar Exercises: Stretches   Quadruped Mid Back Stretch 30 seconds;3 reps    Quadruped Mid Back Stretch Limitations seated 3-way prayer stretch with cane                  PT Education - 12/06/20 1029    Education Details Resume [place] HEP at supported level + 3-way prayer stretch for current back pain    Person(s) Educated Patient    Methods Explanation;Demonstration;Handout    Comprehension Verbalized understanding;Returned demonstration;Need further instruction            PT Short Term Goals - 06/22/20 1015      PT SHORT  TERM GOAL #1   Title Patient will be independent with initial HEP    Status Achieved   06/22/20     PT SHORT TERM GOAL #2   Title Complete FGA assessment    Status Achieved   06/22/20            PT Long Term Goals - 12/06/20 1029      PT LONG TERM GOAL #1   Title Patient will be independent with ongoing/advanced HEP    Status On-going    Target Date 01/31/21      PT LONG TERM GOAL #2   Title Patient will demonstrate improved B LE strength to >/= 4/5 to 4+/5 for improved stability and ease of mobility    Status On-going    Target Date 01/31/21      PT LONG TERM GOAL #3   Title Patient will improve Berg score to >/= 52/56 to improve safety stability with ADLs in standing and reduce risk for falls    Status Unable to assess   09/16/20 - Berg = 52/56; Will reassess next visit   Target Date 01/31/21      PT LONG TERM GOAL #4   Title Patient will report no further instances of falls     Status Partially Met   10/14/20 - no falls since 09/02/20, but continued LOB   Target Date 01/31/21      PT LONG TERM GOAL #5   Title Patient will improve FGA score to >/= 24/30 to improve gait stability and reduce risk for falls    Status Unable to assess   10/11/20 - FGA = 21/30; Will reassess next visit     PT LONG TERM GOAL #6   Title Patient to report no dizziness remaining with bed mobility.    Status Achieved   met as of 08/24/20; Will reassess as indicated     PT LONG TERM GOAL #7   Title Patient will demonstrate safe gait pattern >500 ft with rollator on all surfaces to reduce risk for falls    Status New    Target Date 01/31/21                 Plan - 12/06/20 1029    Clinical Impression Statement Kimberly has been seen on and off since May 2021 for multiple issues including recurrent falls, back and neck pain, headaches and vestibular dysfunction. Plan for next visit as of last visit on 93/79/02 was for recert following new referral for neuropathy from Darryl Nestle, MD at the [facility] with orders for eval & treat for gait training/fall risk, incorporating training with RW as pt reported MD diagnosed B UE and LE axonal neuropathy and stated MD wants her to use a walker. Re-eval/recert delayed by pt inability to return to PT due to a death in the family. Upon reassessment today, she reports continued unsteadiness and LOB despite use of SPC, recent vestibular episode last week, increased overall weakness, along with ongoing back pain (exacerbated by LOB yesterday), headache and neuropathic pain and loss of sensation. Strength testing revealing overall decline in all LE MMT. Anticipate significant decline in balance but formal testing deferred until next visit tomorrow due to time constraints. Provided brief instruction in stretching to address current back pain and encouraged pt to resume [place] Program exercises as tolerated with further HEP to be  addressed upon completion of further testing.    Personal Factors and Comorbidities Comorbidity 3+;Age;Fitness;Past/Current Experience;Time since onset of  injury/illness/exacerbation    Comorbidities Recurrent falls - at least 5 falls in past 6 months, 8-9 since start of year & 7 major falls in past 5-6 yrs (4 concussions, finger fracture); costochondritis, cellulitis of R breast, fibromyalgia, chronic LBP since ~age 10, DDD, sciatica, osteoporosis, degenerative scoliosis, peripheral neuropathy, vertigo, R knee pain, CAD, COPD, HTN, h/o lumpectomy for R breast cancer 2013, ORIF R ankle s/p fall 2006    Examination-Activity Limitations Bathing;Bend;Carry;Dressing;Lift;Locomotion Level;Stand;Transfers;Stairs    Examination-Participation Restrictions Church;Community Activity;Interpersonal Relationship;Laundry;Meal Prep;Shop;Yard Work    Rehab Potential Good    PT Frequency 2x / week    PT Duration 8 weeks    PT Treatment/Interventions ADLs/Self Care Home Management;Cryotherapy;Electrical Stimulation;Iontophoresis 36m/ml Dexamethasone;Moist Heat;Ultrasound;DME Instruction;Gait training;Stair training;Functional mobility training;Therapeutic activities;Therapeutic exercise;Balance training;Neuromuscular re-education;Patient/family education;Manual techniques;Passive range of motion;Dry needling;Taping;Vasopneumatic Device;Vestibular;Visual/perceptual remediation/compensation;Spinal Manipulations    PT Next Visit Plan Formal balance reassessment; gait training with rollator and continuing to address fall risk with review of [place] Fall Prevention Program PRN; balance and dynamic gait training; lumbopelvic strengthening and stabilization - HEP review/update PRN; manual therapy & modalities for pain    PT Home Exercise Plan 10/27 - [place] [facility]; 12/13 - seated 3-way prayer stretch    Consulted and Agree with Plan of Care Patient           Patient will benefit from skilled therapeutic  intervention in order to improve the following deficits and impairments:  Abnormal gait,Decreased activity tolerance,Decreased balance,Decreased coordination,Decreased endurance,Decreased knowledge of precautions,Decreased mobility,Decreased safety awareness,Decreased strength,Difficulty walking,Dizziness,Increased fascial restricitons,Increased muscle spasms,Impaired perceived functional ability,Impaired flexibility,Improper body mechanics,Postural dysfunction,Pain  Visit Diagnosis: Repeated falls  Unsteadiness on feet  Muscle weakness (generalized)  Other abnormalities of gait and mobility  Dizziness and giddiness  Chronic bilateral low back pain without sciatica  Intractable acute post-traumatic headache  Other symptoms and signs involving the nervous system     Problem List Patient Active Problem List   Diagnosis Date Noted  . Low back pain 09/08/2020  . OSA and COPD overlap syndrome ([place] 06/10/2020  . Serotonin withdrawal syndrome without complication [id number] . Recurrent falls 05/02/2020  . Statin intolerance 02/29/2020  . RLS (restless legs syndrome) 11/09/2019  . Kidney stone 02/26/2019  . Recurrent sinusitis 02/25/2019  . Hx of colonic polyp 02/25/2019  . DDD (degenerative disc disease), cervical 09/17/2018  . Degenerative scoliosis 04/22/2018  . Primary insomnia 06/25/2017  . Chronic interstitial cystitis 02/01/2017  . Cough 01/09/2017  . Right arm pain 07/20/2016  . Deviated septum 05/12/2016  . Nasal turbinate hypertrophy 05/12/2016  . Dysphagia   . Allergic rhinitis 01/25/2016  . Other and combined forms of senile cataract 07/15/2015  . Dyspnea   . Left hip pain 04/30/2015  . Sciatica 04/30/2015  . Knee pain, right 04/30/2015  . Bilateral carpal tunnel syndrome 03/04/2015  . Hyperlipidemia 03/01/2015  . Pulmonary nodules 02/12/2015  . External hemorrhoids 02/05/2015  . Chronic night sweats 01/08/2015  . Atypical chest pain 01/01/2015  . Renal  insufficiency 07/16/2014  . Memory loss 05/22/2014  . Peripheral neuropathy 05/22/2014  . Abdominal pain 10/26/2013  . Headache 10/13/2013  . Arthralgia 08/18/2013  . ANA positive 07/29/2013  . Depression 02/05/2013  . Family history of malignant hyperthermia 02/05/2013  . Malignant neoplasm of lower-outer quadrant of right breast of female, estrogen receptor positive ([place] 01/03/2013  . Splenic lesion 09/02/2012  . Asthma, allergic 08/05/2012  . GERD (gastroesophageal reflux disease) 06/03/2012  . Edema 04/08/2012  . Stricture and stenosis of esophagus 10/19/2011  .  Functional constipation 07/21/2011  . Nonspecific (abnormal) findings on radiological and other examination of biliary tract 07/21/2011  . Osteoporosis 04/17/2011  . Leg pain 02/24/2011  . FIBROCYSTIC BREAST DISEASE, HX OF 10/18/2010  . Essential hypertension 08/25/2010  . CAD in native artery 08/25/2010  . TOBACCO ABUSE, HX OF 08/25/2010  . GENERALIZED ANXIETY DISORDER 08/03/2010  . Fibromyalgia 01/11/2010    Percival Spanish, PT, MPT 12/06/2020, 12:48 PM  [facility] [address]  [place], [place], [address] Phone: [phone]   Fax:  [phone]  Name: TAMARRA GEISELMAN MRN: [id number] Date of Birth: 1946-07-08

## 2020-12-06 NOTE — Patient Instructions (Signed)
    Home exercise program created by Avarie Tavano, PT.  For questions, please contact Keaton Stirewalt via phone at [phone] or email at [email or web address]  [facility] [address]  [address] [place], [place], [address] Phone: [phone]   Fax:  [phone]    

## 2020-12-07 ENCOUNTER — Ambulatory Visit: Payer: Medicare Other | Admitting: Physical Therapy

## 2020-12-09 ENCOUNTER — Other Ambulatory Visit: Payer: Self-pay

## 2020-12-09 ENCOUNTER — Encounter: Payer: Self-pay | Admitting: Physical Therapy

## 2020-12-09 ENCOUNTER — Ambulatory Visit: Payer: Medicare Other | Admitting: Physical Therapy

## 2020-12-09 DIAGNOSIS — R2681 Unsteadiness on feet: Secondary | ICD-10-CM

## 2020-12-09 DIAGNOSIS — M6281 Muscle weakness (generalized): Secondary | ICD-10-CM | POA: Diagnosis not present

## 2020-12-09 DIAGNOSIS — G8929 Other chronic pain: Secondary | ICD-10-CM

## 2020-12-09 DIAGNOSIS — R42 Dizziness and giddiness: Secondary | ICD-10-CM | POA: Diagnosis not present

## 2020-12-09 DIAGNOSIS — G44311 Acute post-traumatic headache, intractable: Secondary | ICD-10-CM | POA: Diagnosis not present

## 2020-12-09 DIAGNOSIS — R296 Repeated falls: Secondary | ICD-10-CM

## 2020-12-09 DIAGNOSIS — R29818 Other symptoms and signs involving the nervous system: Secondary | ICD-10-CM | POA: Diagnosis not present

## 2020-12-09 DIAGNOSIS — R2689 Other abnormalities of gait and mobility: Secondary | ICD-10-CM

## 2020-12-09 DIAGNOSIS — M545 Low back pain, unspecified: Secondary | ICD-10-CM | POA: Diagnosis not present

## 2020-12-09 NOTE — Therapy (Signed)
[facility] [place] [address]  [address] [place], [place], [address] Phone: [phone]   Fax:  [phone]  Physical Therapy Treatment  Patient Details  Name: [name] MRN: [id number] Date of Birth: 05/05/1946 Referring Provider (PT): Stacey Blyth, MD & Natalia Gonzalez, MD   Encounter Date: 12/09/2020   PT End of Session - 12/09/20 1312    Visit Number 31    Number of Visits 46    Date for PT Re-Evaluation 01/31/21    Authorization Type [employer]    PT Start Time 1312    PT Stop Time 1406    PT Time Calculation (min) 54 min    Activity Tolerance Patient tolerated treatment well    Behavior During Therapy WFL for tasks assessed/performed           Past Medical History:  Diagnosis Date  . Allergy   . Anemia   . Anxiety    past hx   . Arthritis   . Asthma   . Atrial flutter (HCC)    past history- not current  . CAD (coronary artery disease) CARDIOLOGIST--  DR MCALHANY   mild non-obstructive cad  . Cancer (HCC)    right  . Cataract    bilaterally removed   . Chronic constipation   . Chronic kidney disease    interstitial cystitis  . Concussion    x 3  . COPD (chronic obstructive pulmonary disease) (HCC)   . Depression    past hx   . Family history of malignant hyperthermia    father had this  . Fibromyalgia   . Frequency of urination   . GERD (gastroesophageal reflux disease)   . H/O hiatal hernia   . History of basal cell carcinoma excision    X2  . History of breast cancer ONCOLOGIST-- DR MAGRINAT---  NO RECURRANCE   DX 07/2012;  LOW GRADE DCIS  ER+PR+  ----  S/P RIGHT LUMPECTOMY WITH NEGATIVE MARGINS/   RADIATION ENDED 11/2012  . History of chronic bronchitis   . History of colonic polyps   . History of tachycardia    CONTROLLED  WITH ATENOLOL  . Hyperlipidemia   . Hypertension   . Neuromuscular disorder (HCC)    fibromyalgia  . Neuropathy   . Osteoporosis 01/2019   T score -2.2  stable/improved from prior study  . Pelvic pain   . Personal history of radiation therapy   . S/P radiation therapy 11/12/12 - 12/05/12   right Breast  . Sepsis (HCC) 2014   from UTI   . Sinus headache   . Urgency of urination     Past Surgical History:  Procedure Laterality Date  . 24 HOUR PH STUDY N/A 04/03/2016   Procedure: 24 HOUR PH STUDY;  Surgeon: Steven Paul Armbruster, MD;  Location: [facility];  Service: Gastroenterology;  Laterality: N/A;  . BREAST LUMPECTOMY Right 10-11-2012   W/ SLN BX  . CARDIAC CATHETERIZATION  09-13-2007  DR STUCKEY   WELL-PRESERVED LVF/  DIFFUSE SCATTERED CORONARY CALCIFACATION AND ATHEROSCLEROSIS WITHOUT OBSTRUCTION  . CARDIAC CATHETERIZATION  08-04-2010  DR MCALHANY   NON-OBSTRUCTIVE CAD/  pLAD 40%/  oLAD 30%/  mLAD 30%/  pRCA 30%/  EF 60%  . CARDIOVASCULAR STRESS TEST  06-18-2012  DR McALHANY   LOW RISK NUCLEAR STUDY/  SMALL FIXED AREA OF MODERATELY DECREASED UPTAKE IN ANTEROSEPTAL WALL WHICH MAY BE ARTIFACTUAL/  NO ISCHEMIA/  EF 68%  . COLONOSCOPY  09-29-2010  .   CYSTOSCOPY    . CYSTOSCOPY WITH HYDRODISTENSION AND BIOPSY N/A 03/06/2014   Procedure: CYSTOSCOPY/HYDRODISTENSION/ INSTILATION OF MARCAINE AND PYRIDIUM;  Surgeon: Ailene Rud, MD;  Location: [facility];  Service: Urology;  Laterality: N/A;  . ESOPHAGEAL MANOMETRY N/A 04/03/2016   Procedure: ESOPHAGEAL MANOMETRY (EM);  Surgeon: Manus Gunning, MD;  Location: [facility];  Service: Gastroenterology;  Laterality: N/A;  . EXTRACORPOREAL SHOCK WAVE LITHOTRIPSY Left 02/06/2019   Procedure: EXTRACORPOREAL SHOCK WAVE LITHOTRIPSY (ESWL);  Surgeon: Kathie Rhodes, MD;  Location: [facility];  Service: Urology;  Laterality: Left;  . NASAL SINUS SURGERY  1985  . ORIF RIGHT ANKLE  FX  2006  . POLYPECTOMY    . REMOVAL VOCAL CORD CYST  FEB 2014  . RIGHT BREAST BX  08-23-2012  . RIGHT HAND SURGERY  X3  LAST ONE 2009   INCLUDES  ORIF RIGHT 5TH FINGER AND REVISION TWICE  .  SKIN CANCER EXCISION    . TONSILLECTOMY AND ADENOIDECTOMY  AGE 23  . TOTAL ABDOMINAL HYSTERECTOMY W/ BILATERAL SALPINGOOPHORECTOMY  1982   W/  APPENDECTOMY  . TRANSTHORACIC ECHOCARDIOGRAM  06-24-2012   GRADE I DIASTOLIC DYSFUNCTION/  EF 55-60%/  MILD MR  . UPPER GASTROINTESTINAL ENDOSCOPY      There were no vitals filed for this visit.   Subjective Assessment - 12/09/20 1320    Subjective Pt reports the new stretches from last visit really helped with her back pain. Overall pain not too bad today with main issues being her headache and her neuropathy and her back only hurting from how she twisted when getting out of the car.    Pertinent History Recurrent falls - 3 in past 6 months, 7 major falls in past 5-6 yrs (4 concussions, finger fracture); costochondritis, cellulitis of R breast, fibromyalgia, chronic LBP since ~age 44, DDD, sciatica, osteoporosis, degenerative scoliosis, peripheral neuropathy, vertigo, R knee pain, CAD, COPD, HTN, h/o lumpectomy for R breast cancer 2013, ORIF R ankle s/p fall 2006    Patient Stated Goals "Be able to gain some strength in my legs to help with my back. To be able stand w/o feeling like I'm swaying and walk w/o tripping."    Currently in Pain? Yes    Pain Score 3     Pain Location Back    Pain Orientation Lower    Pain Descriptors / Indicators Burning    Pain Type Chronic pain    Pain Score 6    Pain Location Head    Pain Descriptors / Indicators Headache;Sore    Pain Type Chronic pain    Pain Onset --   since fall 4 months ago   Pain Score 5    Pain Location Generalized   lower legs and hands/elbows   Pain Descriptors / Indicators Constant;Burning;Tingling    Pain Type Neuropathic pain              [facility] PT Assessment - 12/09/20 1312      Ambulation/Gait   Gait velocity 3.03 ft/sec      Standardized Balance Assessment   10 Meter Walk 10.81 sec      Berg Balance Test   Sit to Stand Able to stand without using hands and stabilize  independently    Standing Unsupported Able to stand safely 2 minutes    Sitting with Back Unsupported but Feet Supported on Floor or Stool Able to sit safely and securely 2 minutes    Stand to Sit Sits safely with minimal use of  hands    Transfers Able to transfer safely, minor use of hands    Standing Unsupported with Eyes Closed Able to stand 10 seconds with supervision    Standing Unsupported with Feet Together Able to place feet together independently and stand 1 minute safely    From Standing, Reach Forward with Outstretched Arm Can reach forward >12 cm safely (5")    From Standing Position, Pick up Object from Floor Able to pick up shoe, needs supervision    From Standing Position, Turn to Look Behind Over each Shoulder Looks behind one side only/other side shows less weight shift    Turn 360 Degrees Able to turn 360 degrees safely one side only in 4 seconds or less    Standing Unsupported, Alternately Place Feet on Step/Stool Able to stand independently and complete 8 steps >20 seconds    Standing Unsupported, One Foot in Front Able to plae foot ahead of the other independently and hold 30 seconds    Standing on One Leg Tries to lift leg/unable to hold 3 seconds but remains standing independently    Total Score 46    Berg comment: 46-51 moderate fall risk (>50%)      Functional Gait  Assessment   Gait Level Surface Walks 20 ft in less than 7 sec but greater than 5.5 sec, uses assistive device, slower speed, mild gait deviations, or deviates 6-10 in outside of the 12 in walkway width.    Change in Gait Speed Able to change speed, demonstrates mild gait deviations, deviates 6-10 in outside of the 12 in walkway width, or no gait deviations, unable to achieve a major change in velocity, or uses a change in velocity, or uses an assistive device.    Gait with Horizontal Head Turns Performs head turns with moderate changes in gait velocity, slows down, deviates 10-15 in outside 12 in walkway  width but recovers, can continue to walk.    Gait with Vertical Head Turns Performs task with slight change in gait velocity (eg, minor disruption to smooth gait path), deviates 6 - 10 in outside 12 in walkway width or uses assistive device    Gait and Pivot Turn Pivot turns safely in greater than 3 sec and stops with no loss of balance, or pivot turns safely within 3 sec and stops with mild imbalance, requires small steps to catch balance.    Step Over Obstacle Is able to step over one shoe box (4.5 in total height) without changing gait speed. No evidence of imbalance.    Gait with Narrow Base of Support Ambulates less than 4 steps heel to toe or cannot perform without assistance.    Gait with Eyes Closed Walks 20 ft, slow speed, abnormal gait pattern, evidence for imbalance, deviates 10-15 in outside 12 in walkway width. Requires more than 9 sec to ambulate 20 ft.    Ambulating Backwards Walks 20 ft, slow speed, abnormal gait pattern, evidence for imbalance, deviates 10-15 in outside 12 in walkway width.    Steps Alternating feet, must use rail.    Total Score 15    FGA comment: < 19 = high risk fall                         [facility] Adult PT Treatment/Exercise - 12/09/20 1312      Knee/Hip Exercises: Aerobic   Nustep L4 x 6 min (UE/LE)  PT Short Term Goals - 06/22/20 1015      PT SHORT TERM GOAL #1   Title Patient will be independent with initial HEP    Status Achieved   06/22/20     PT SHORT TERM GOAL #2   Title Complete FGA assessment    Status Achieved   06/22/20            PT Long Term Goals - 12/09/20 1406      PT LONG TERM GOAL #1   Title Patient will be independent with ongoing/advanced HEP    Status On-going    Target Date 01/31/21      PT LONG TERM GOAL #2   Title Patient will demonstrate improved B LE strength to >/= 4/5 to 4+/5 for improved stability and ease of mobility    Status On-going    Target Date 01/31/21       PT LONG TERM GOAL #3   Title Patient will improve Berg score to >/= 52/56 to improve safety stability with ADLs in standing and reduce risk for falls    Baseline perviously met on 09/16/20 - Berg = 52/56, but pt has regressed to 46/56 as of 12/09/20    Status On-going    Target Date 01/31/21      PT LONG TERM GOAL #4   Title Patient will report no further instances of falls    Status Partially Met   10/14/20 - no falls since 09/02/20, but continued LOB   Target Date 01/31/21      PT LONG TERM GOAL #5   Title Patient will improve FGA score to >/= 24/30 to improve gait stability and reduce risk for falls    Baseline perviously met on 10/11/20 - FGA = 21/30, but pt has regressed to 15/30 as of 12/09/20    Status On-going    Target Date 01/31/21      PT LONG TERM GOAL #6   Title Patient to report no dizziness remaining with bed mobility.    Status Achieved   met as of 08/24/20; Will reassess as indicated     PT LONG TERM GOAL #7   Title Patient will demonstrate safe gait pattern >500 ft with rollator on all surfaces to reduce risk for falls    Status On-going    Target Date 01/31/21                 Plan - 12/09/20 1406    Clinical Impression Statement [name] reports stretches provided last visit for her back pain have really helped, only noting pain today from transferring out of her car - reviewed proper car transfer technique to avoid back pain. Formal balance reassessment completed today with declines noted in all tests including reduction in gait speed to 3.03 ft/sec from 3.63 ft/sec at last testing, as well as declines in Berg from 52/56 to 46/56 and FGA from 21/30 to 15/30. Testing indicating moderate to high fall risk and reinforcing need for additional support for safety with ambulation such as rollator or RW as recommended by neurologist. [name] to resume [place] [facility] exercises over holiday break and will resume gait training with rollator vs RW as well as  strengthening and balance training to reduce fall risk upon return to PT.    Personal Factors and Comorbidities Comorbidity 3+;Age;Fitness;Past/Current Experience;Time since onset of injury/illness/exacerbation    Comorbidities Recurrent falls - at least 5 falls in past 6 months, 8-9 since start of year & 7 major falls   in past 5-6 yrs (4 concussions, finger fracture); costochondritis, cellulitis of R breast, fibromyalgia, chronic LBP since ~age 70, DDD, sciatica, osteoporosis, degenerative scoliosis, peripheral neuropathy, vertigo, R knee pain, CAD, COPD, HTN, h/o lumpectomy for R breast cancer 2013, ORIF R ankle s/p fall 2006    Examination-Activity Limitations Bathing;Bend;Carry;Dressing;Lift;Locomotion Level;Stand;Transfers;Stairs    Examination-Participation Restrictions Church;Community Activity;Interpersonal Relationship;Laundry;Meal Prep;Shop;Yard Work    Rehab Potential Good    PT Frequency 2x / week    PT Duration 8 weeks    PT Treatment/Interventions ADLs/Self Care Home Management;Cryotherapy;Electrical Stimulation;Iontophoresis 40m/ml Dexamethasone;Moist Heat;Ultrasound;DME Instruction;Gait training;Stair training;Functional mobility training;Therapeutic activities;Therapeutic exercise;Balance training;Neuromuscular re-education;Patient/family education;Manual techniques;Passive range of motion;Dry needling;Taping;Vasopneumatic Device;Vestibular;Visual/perceptual remediation/compensation;Spinal Manipulations    PT Next Visit Plan Gait training with rollator vs RW, Continue to address fall risk with balance and dynamic gait training along with review of [place] [facility] PRN; Lumbopelvic strengthening and stabilization - HEP review/update PRN; Manual therapy & modalities for pain    PT Home Exercise Plan 10/27 - [place]; 12/13 - seated 3-way prayer stretch    Consulted and Agree with Plan of Care Patient           Patient will benefit from skilled  therapeutic intervention in order to improve the following deficits and impairments:  Abnormal gait,Decreased activity tolerance,Decreased balance,Decreased coordination,Decreased endurance,Decreased knowledge of precautions,Decreased mobility,Decreased safety awareness,Decreased strength,Difficulty walking,Dizziness,Increased fascial restricitons,Increased muscle spasms,Impaired perceived functional ability,Impaired flexibility,Improper body mechanics,Postural dysfunction,Pain  Visit Diagnosis: Repeated falls  Unsteadiness on feet  Muscle weakness (generalized)  Other abnormalities of gait and mobility  Dizziness and giddiness  Chronic bilateral low back pain without sciatica  Intractable acute post-traumatic headache  Other symptoms and signs involving the nervous system     Problem List Patient Active Problem List   Diagnosis Date Noted  . Low back pain 09/08/2020  . OSA and COPD overlap syndrome ([place] 06/10/2020  . Serotonin withdrawal syndrome without complication [id number]/29/[id number] . Recurrent falls 05/02/2020  . Statin intolerance 02/29/2020  . RLS (restless legs syndrome) 11/09/2019  . Kidney stone 02/26/2019  . Recurrent sinusitis 02/25/2019  . Hx of colonic polyp 02/25/2019  . DDD (degenerative disc disease), cervical 09/17/2018  . Degenerative scoliosis 04/22/2018  . Primary insomnia 06/25/2017  . Chronic interstitial cystitis 02/01/2017  . Cough 01/09/2017  . Right arm pain 07/20/2016  . Deviated septum 05/12/2016  . Nasal turbinate hypertrophy 05/12/2016  . Dysphagia   . Allergic rhinitis 01/25/2016  . Other and combined forms of senile cataract 07/15/2015  . Dyspnea   . Left hip pain 04/30/2015  . Sciatica 04/30/2015  . Knee pain, right 04/30/2015  . Bilateral carpal tunnel syndrome 03/04/2015  . Hyperlipidemia 03/01/2015  . Pulmonary nodules 02/12/2015  . External hemorrhoids 02/05/2015  . Chronic night sweats 01/08/2015  . Atypical chest pain 01/01/2015   . Renal insufficiency 07/16/2014  . Memory loss 05/22/2014  . Peripheral neuropathy 05/22/2014  . Abdominal pain 10/26/2013  . Headache 10/13/2013  . Arthralgia 08/18/2013  . ANA positive 07/29/2013  . Depression 02/05/2013  . Family history of malignant hyperthermia 02/05/2013  . Malignant neoplasm of lower-outer quadrant of right breast of female, estrogen receptor positive ([place] 01/03/2013  . Splenic lesion 09/02/2012  . Asthma, allergic 08/05/2012  . GERD (gastroesophageal reflux disease) 06/03/2012  . Edema 04/08/2012  . Stricture and stenosis of esophagus 10/19/2011  . Functional constipation 07/21/2011  . Nonspecific (abnormal) findings on radiological and other examination of biliary tract 07/21/2011  . Osteoporosis 04/17/2011  . Leg pain  02/24/2011  . FIBROCYSTIC BREAST DISEASE, HX OF 10/18/2010  . Essential hypertension 08/25/2010  . CAD in native artery 08/25/2010  . TOBACCO ABUSE, HX OF 08/25/2010  . GENERALIZED ANXIETY DISORDER 08/03/2010  . Fibromyalgia 01/11/2010    Percival Spanish, PT, MPT 12/09/2020, 7:46 PM  [facility] [address]  [place], [place], [address] Phone: [phone]   Fax:  [phone]  Name: [name] MRN: [id number] Date of Birth: 1946-09-26

## 2020-12-20 ENCOUNTER — Ambulatory Visit: Payer: Medicare Other | Admitting: Physical Therapy

## 2020-12-23 ENCOUNTER — Other Ambulatory Visit: Payer: Self-pay | Admitting: Family Medicine

## 2020-12-27 ENCOUNTER — Ambulatory Visit: Payer: Medicare Other | Admitting: Physical Therapy

## 2020-12-30 ENCOUNTER — Ambulatory Visit: Payer: Medicare Other | Attending: Family Medicine

## 2020-12-30 ENCOUNTER — Other Ambulatory Visit: Payer: Self-pay

## 2020-12-30 DIAGNOSIS — R2689 Other abnormalities of gait and mobility: Secondary | ICD-10-CM | POA: Diagnosis not present

## 2020-12-30 DIAGNOSIS — R2681 Unsteadiness on feet: Secondary | ICD-10-CM

## 2020-12-30 DIAGNOSIS — M545 Low back pain, unspecified: Secondary | ICD-10-CM | POA: Diagnosis not present

## 2020-12-30 DIAGNOSIS — R42 Dizziness and giddiness: Secondary | ICD-10-CM

## 2020-12-30 DIAGNOSIS — G44311 Acute post-traumatic headache, intractable: Secondary | ICD-10-CM | POA: Diagnosis not present

## 2020-12-30 DIAGNOSIS — R296 Repeated falls: Secondary | ICD-10-CM | POA: Insufficient documentation

## 2020-12-30 DIAGNOSIS — R29818 Other symptoms and signs involving the nervous system: Secondary | ICD-10-CM | POA: Diagnosis not present

## 2020-12-30 DIAGNOSIS — M6281 Muscle weakness (generalized): Secondary | ICD-10-CM | POA: Insufficient documentation

## 2020-12-30 DIAGNOSIS — G8929 Other chronic pain: Secondary | ICD-10-CM | POA: Insufficient documentation

## 2020-12-30 NOTE — Therapy (Signed)
[facility] [name]  [place], [place], [address] Phone: [phone]   Fax:  [phone]  Physical Therapy Treatment  Patient Details  Name: [name] MRN: [id number] Date of Birth: [birth year] Referring Provider (PT): [name], MD & Darryl Nestle, MD   Encounter Date: 12/30/2020   PT End of Session - 12/30/20 1025    Visit Number [id number]    Number of Visits 36    Date for PT Re-Evaluation 01/31/21    Authorization Type [employer]    PT Start Time 1022    PT Stop Time 1100    PT Time Calculation (min) 38 min    Activity Tolerance Patient tolerated treatment well    Behavior During Therapy [facility] for tasks assessed/performed           Past Medical History:  Diagnosis Date  . Allergy   . Anemia   . Anxiety    past hx   . Arthritis   . Asthma   . Atrial flutter ([place])    past history- not current  . CAD (coronary artery disease) CARDIOLOGIST--  DR Angelena Form   mild non-obstructive cad  . Cancer ([place])    right  . Cataract    bilaterally removed   . Chronic constipation   . Chronic kidney disease    interstitial cystitis  . Concussion    x 3  . COPD (chronic obstructive pulmonary disease) ([place])   . Depression    past hx   . Family history of malignant hyperthermia    father had this  . Fibromyalgia   . Frequency of urination   . GERD (gastroesophageal reflux disease)   . H/O hiatal hernia   . History of basal cell carcinoma excision    X2  . History of breast cancer ONCOLOGIST-- DR Jana Hakim---  NO RECURRANCE   DX 07/2012;  LOW GRADE DCIS  ER+PR+  ----  S/P RIGHT LUMPECTOMY WITH NEGATIVE MARGINS/   RADIATION ENDED 11/2012  . History of chronic bronchitis   . History of colonic polyps   . History of tachycardia    CONTROLLED  WITH ATENOLOL  . Hyperlipidemia   . Hypertension   . Neuromuscular disorder (HCC)    fibromyalgia  . Neuropathy   . Osteoporosis 01/2019   T score -2.2  stable/improved from prior study  . Pelvic pain   . Personal history of radiation therapy   . S/P radiation therapy 11/12/12 - 12/05/12   right Breast  . Sepsis ([place]) 2014   from UTI   . Sinus headache   . Urgency of urination     Past Surgical History:  Procedure Laterality Date  . [place] STUDY N/A 04/03/2016   Procedure: [place] STUDY;  Surgeon: Manus Gunning, MD;  Location: [facility];  Service: Gastroenterology;  Laterality: N/A;  . BREAST LUMPECTOMY Right 10-11-2012   W/ SLN BX  . CARDIAC CATHETERIZATION  09-13-2007  DR Lia Foyer   WELL-PRESERVED LVF/  DIFFUSE SCATTERED CORONARY CALCIFACATION AND ATHEROSCLEROSIS WITHOUT OBSTRUCTION  . CARDIAC CATHETERIZATION  08-04-2010  DR Angelena Form   NON-OBSTRUCTIVE CAD/  pLAD 40%/  oLAD 30%/  mLAD 30%/  pRCA 30%/  EF 60%  . CARDIOVASCULAR STRESS TEST  06-18-2012  DR McALHANY   LOW RISK NUCLEAR STUDY/  SMALL FIXED AREA OF MODERATELY DECREASED UPTAKE IN ANTEROSEPTAL WALL WHICH MAY BE ARTIFACTUAL/  NO ISCHEMIA/  EF 68%  . COLONOSCOPY  09-29-2010  .  CYSTOSCOPY    . CYSTOSCOPY WITH HYDRODISTENSION AND BIOPSY N/A 03/06/2014   Procedure: CYSTOSCOPY/HYDRODISTENSION/ INSTILATION OF MARCAINE AND PYRIDIUM;  Surgeon: Ailene Rud, MD;  Location: [facility];  Service: Urology;  Laterality: N/A;  . ESOPHAGEAL MANOMETRY N/A 04/03/2016   Procedure: ESOPHAGEAL MANOMETRY (EM);  Surgeon: Manus Gunning, MD;  Location: [facility];  Service: Gastroenterology;  Laterality: N/A;  . EXTRACORPOREAL SHOCK WAVE LITHOTRIPSY Left 02/06/2019   Procedure: EXTRACORPOREAL SHOCK WAVE LITHOTRIPSY (ESWL);  Surgeon: Kathie Rhodes, MD;  Location: [facility];  Service: Urology;  Laterality: Left;  . NASAL SINUS SURGERY  1985  . ORIF RIGHT ANKLE  FX  2006  . POLYPECTOMY    . REMOVAL VOCAL CORD CYST  FEB 2014  . RIGHT BREAST BX  08-23-2012  . RIGHT HAND SURGERY  X3  LAST ONE 2009   INCLUDES  ORIF RIGHT 5TH FINGER AND REVISION TWICE  .  SKIN CANCER EXCISION    . TONSILLECTOMY AND ADENOIDECTOMY  AGE 52  . TOTAL ABDOMINAL HYSTERECTOMY W/ BILATERAL SALPINGOOPHORECTOMY  1982   W/  APPENDECTOMY  . TRANSTHORACIC ECHOCARDIOGRAM  06-24-2012   GRADE I DIASTOLIC DYSFUNCTION/  EF 55-60%/  MILD MR  . UPPER GASTROINTESTINAL ENDOSCOPY      There were no vitals filed for this visit.   Subjective Assessment - 12/30/20 1030    Subjective Pt. noting head ache is her biggest concern today.    Pertinent History Recurrent falls - 3 in past 6 months, 7 major falls in past 5-6 yrs (4 concussions, finger fracture); costochondritis, cellulitis of R breast, fibromyalgia, chronic LBP since ~age 42, DDD, sciatica, osteoporosis, degenerative scoliosis, peripheral neuropathy, vertigo, R knee pain, CAD, COPD, HTN, h/o lumpectomy for R breast cancer 2013, ORIF R ankle s/p fall 2006    Patient Stated Goals "Be able to gain some strength in my legs to help with my back. To be able stand w/o feeling like I'm swaying and walk w/o tripping."    Currently in Pain? Yes    Pain Score 0-No pain    Pain Location Back    Pain Frequency Intermittent    Aggravating Factors  unsure    Multiple Pain Sites Yes    Pain Score 5    Pain Location Head    Pain Orientation Left    Pain Descriptors / Indicators Headache;Sore    Pain Type Chronic pain    Pain Score 0   wakes her up in the night   Pain Location Arm    Pain Orientation Left;Right   L more often than Right   Pain Type Neuropathic pain    Pain Frequency Intermittent    Aggravating Factors  nights and reaching overhead                             [facility] Adult PT Treatment/Exercise - 12/30/20 0001      Ambulation/Gait   Ambulation/Gait Yes    Ambulation/Gait Assistance 5: Supervision    Ambulation/Gait Assistance Details cues for upright posture and increased gait speed    Ambulation Distance (Feet) 360 Feet   4 trials of 90 ft ambulation with rollator with training on proper transfer  safety to mat table sit<>stand between each trial   Assistive device Rollator    Gait Pattern Within Functional Limits;Step-through pattern    Ambulation Surface Level;Indoor      Knee/Hip Exercises: Aerobic   Nustep L5 x 6 min (UE/LE)  Knee/Hip Exercises: Standing   Heel Raises Both;15 reps    Heel Raises Limitations toe/heel raise    Hip Abduction Right;Left;10 reps;Knee straight;Stengthening    Abduction Limitations holding onto RW      Knee/Hip Exercises: Seated   Sit to Sand 10 reps;without UE support   from mat table with cueing for slow descending                   PT Short Term Goals - 06/22/20 1015      PT SHORT TERM GOAL #1   Title Patient will be independent with initial HEP    Status Achieved   06/22/20     PT SHORT TERM GOAL #2   Title Complete FGA assessment    Status Achieved   06/22/20            PT Long Term Goals - 12/09/20 1406      PT LONG TERM GOAL #1   Title Patient will be independent with ongoing/advanced HEP    Status On-going    Target Date 01/31/21      PT LONG TERM GOAL #2   Title Patient will demonstrate improved B LE strength to >/= 4/5 to 4+/5 for improved stability and ease of mobility    Status On-going    Target Date 01/31/21      PT LONG TERM GOAL #3   Title Patient will improve Berg score to >/= 52/56 to improve safety stability with ADLs in standing and reduce risk for falls    Baseline perviously met on 09/16/20 - Berg = 52/56, but pt has regressed to 46/56 as of 12/09/20    Status On-going    Target Date 01/31/21      PT LONG TERM GOAL #4   Title Patient will report no further instances of falls    Status Partially Met   10/14/20 - no falls since 09/02/20, but continued LOB   Target Date 01/31/21      PT LONG TERM GOAL #5   Title Patient will improve FGA score to >/= 24/30 to improve gait stability and reduce risk for falls    Baseline perviously met on 10/11/20 - FGA = 21/30, but pt has regressed to 15/30 as  of 12/09/20    Status On-going    Target Date 01/31/21      PT LONG TERM GOAL #6   Title Patient to report no dizziness remaining with bed mobility.    Status Achieved   met as of 08/24/20; Will reassess as indicated     PT LONG TERM GOAL #7   Title Patient will demonstrate safe gait pattern >500 ft with rollator on all surfaces to reduce risk for falls    Status On-going    Target Date 01/31/21                 Plan - 12/30/20 1250    Clinical Impression Statement [name] denies questions regarding OTAGO HEP.  Session focused on safety training with rollator with transfers along with cueing for upright posture and increased gait speed.  Duration of session focused on LE strengthening/balance training.  [name] reporting intermittent dizziness while standing and sitting throughout session which did not seem to have a pattern and subsided quickly.  Close supervising from therapist provided throughout session for safety and supervising PT reinforcing recommendation of purchase of rollator for safety with pt. making plans to purchase.    Comorbidities Recurrent falls - at least 5 falls in  past 6 months, 8-9 since start of year & 7 major falls in past 5-6 yrs (4 concussions, finger fracture); costochondritis, cellulitis of R breast, fibromyalgia, chronic LBP since ~age 85, DDD, sciatica, osteoporosis, degenerative scoliosis, peripheral neuropathy, vertigo, R knee pain, CAD, COPD, HTN, h/o lumpectomy for R breast cancer 2013, ORIF R ankle s/p fall 2006    Stability/Clinical Decision Making Evolving/Moderate complexity    Rehab Potential Good    PT Frequency 2x / week    PT Duration 8 weeks    PT Treatment/Interventions ADLs/Self Care Home Management;Cryotherapy;Electrical Stimulation;Iontophoresis 58m/ml Dexamethasone;Moist Heat;Ultrasound;DME Instruction;Gait training;Stair training;Functional mobility training;Therapeutic activities;Therapeutic exercise;Balance training;Neuromuscular  re-education;Patient/family education;Manual techniques;Passive range of motion;Dry needling;Taping;Vasopneumatic Device;Vestibular;Visual/perceptual remediation/compensation;Spinal Manipulations    PT Next Visit Plan Gait training with rollator vs RW, Continue to address fall risk with balance and dynamic gait training along with review of [place] Fall Prevention Program PRN; Lumbopelvic strengthening and stabilization - HEP review/update PRN; Manual therapy & modalities for pain    PT Home Exercise Plan 10/27 - [place]; 12/13 - seated 3-way prayer stretch    Consulted and Agree with Plan of Care Patient           Patient will benefit from skilled therapeutic intervention in order to improve the following deficits and impairments:  Abnormal gait,Decreased activity tolerance,Decreased balance,Decreased coordination,Decreased endurance,Decreased knowledge of precautions,Decreased mobility,Decreased safety awareness,Decreased strength,Difficulty walking,Dizziness,Increased fascial restricitons,Increased muscle spasms,Impaired perceived functional ability,Impaired flexibility,Improper body mechanics,Postural dysfunction,Pain  Visit Diagnosis: Repeated falls  Unsteadiness on feet  Muscle weakness (generalized)  Other abnormalities of gait and mobility  Dizziness and giddiness  Chronic bilateral low back pain without sciatica  Intractable acute post-traumatic headache  Other symptoms and signs involving the nervous system     Problem List Patient Active Problem List   Diagnosis Date Noted  . Low back pain 09/08/2020  . OSA and COPD overlap syndrome ([place] 06/10/2020  . Serotonin withdrawal syndrome without complication 029/47/6546 . Recurrent falls 05/02/2020  . Statin intolerance 02/29/2020  . RLS (restless legs syndrome) 11/09/2019  . Kidney stone 02/26/2019  . Recurrent sinusitis 02/25/2019  . Hx of colonic polyp 02/25/2019  . DDD (degenerative disc  disease), cervical 09/17/2018  . Degenerative scoliosis 04/22/2018  . Primary insomnia 06/25/2017  . Chronic interstitial cystitis 02/01/2017  . Cough 01/09/2017  . Right arm pain 07/20/2016  . Deviated septum 05/12/2016  . Nasal turbinate hypertrophy 05/12/2016  . Dysphagia   . Allergic rhinitis 01/25/2016  . Other and combined forms of senile cataract 07/15/2015  . Dyspnea   . Left hip pain 04/30/2015  . Sciatica 04/30/2015  . Knee pain, right 04/30/2015  . Bilateral carpal tunnel syndrome 03/04/2015  . Hyperlipidemia 03/01/2015  . Pulmonary nodules 02/12/2015  . External hemorrhoids 02/05/2015  . Chronic night sweats 01/08/2015  . Atypical chest pain 01/01/2015  . Renal insufficiency 07/16/2014  . Memory loss 05/22/2014  . Peripheral neuropathy 05/22/2014  . Abdominal pain 10/26/2013  . Headache 10/13/2013  . Arthralgia 08/18/2013  . ANA positive 07/29/2013  . Depression 02/05/2013  . Family history of malignant hyperthermia 02/05/2013  . Malignant neoplasm of lower-outer quadrant of right breast of female, estrogen receptor positive ([place] 01/03/2013  . Splenic lesion 09/02/2012  . Asthma, allergic 08/05/2012  . GERD (gastroesophageal reflux disease) 06/03/2012  . Edema 04/08/2012  . Stricture and stenosis of esophagus 10/19/2011  . Functional constipation 07/21/2011  . Nonspecific (abnormal) findings on radiological and other examination of biliary tract 07/21/2011  . Osteoporosis 04/17/2011  .  Leg pain 02/24/2011  . FIBROCYSTIC BREAST DISEASE, HX OF 10/18/2010  . Essential hypertension 08/25/2010  . CAD in native artery 08/25/2010  . TOBACCO ABUSE, HX OF 08/25/2010  . GENERALIZED ANXIETY DISORDER 08/03/2010  . Fibromyalgia 01/11/2010    Bess Harvest, PTA 12/30/20 12:58 PM   [facility] [address]  [place], [place], [address] Phone: [phone]   Fax:  [phone]  Name: [name] MRN:  [id number] Date of Birth: [birth year]

## 2021-01-03 ENCOUNTER — Ambulatory Visit: Payer: Medicare Other | Admitting: Physical Therapy

## 2021-01-03 ENCOUNTER — Encounter: Payer: Self-pay | Admitting: Physical Therapy

## 2021-01-03 ENCOUNTER — Other Ambulatory Visit: Payer: Self-pay

## 2021-01-03 DIAGNOSIS — G44311 Acute post-traumatic headache, intractable: Secondary | ICD-10-CM

## 2021-01-03 DIAGNOSIS — R2681 Unsteadiness on feet: Secondary | ICD-10-CM

## 2021-01-03 DIAGNOSIS — R42 Dizziness and giddiness: Secondary | ICD-10-CM

## 2021-01-03 DIAGNOSIS — M6281 Muscle weakness (generalized): Secondary | ICD-10-CM | POA: Diagnosis not present

## 2021-01-03 DIAGNOSIS — R29818 Other symptoms and signs involving the nervous system: Secondary | ICD-10-CM

## 2021-01-03 DIAGNOSIS — M545 Low back pain, unspecified: Secondary | ICD-10-CM | POA: Diagnosis not present

## 2021-01-03 DIAGNOSIS — G8929 Other chronic pain: Secondary | ICD-10-CM

## 2021-01-03 DIAGNOSIS — R296 Repeated falls: Secondary | ICD-10-CM

## 2021-01-03 DIAGNOSIS — R2689 Other abnormalities of gait and mobility: Secondary | ICD-10-CM | POA: Diagnosis not present

## 2021-01-03 NOTE — Therapy (Signed)
[facility] [address]  [place], [place], [address] Phone: [phone]   Fax:  [phone]  Physical Therapy Treatment  Patient Details  Name: [name] MRN: [id number] Date of Birth: 1946-06-25 Referring Provider (PT): [name], MD & Darryl Nestle, MD   Encounter Date: 01/03/2021   PT End of Session - 01/03/21 1024    Visit Number 33    Number of Visits 62    Date for PT Re-Evaluation 01/31/21    Authorization Type [employer]    PT Start Time 1024    PT Stop Time 1116    PT Time Calculation (min) 52 min    Activity Tolerance Patient tolerated treatment well    Behavior During Therapy [facility] for tasks assessed/performed           Past Medical History:  Diagnosis Date  . Allergy   . Anemia   . Anxiety    past hx   . Arthritis   . Asthma   . Atrial flutter ([place])    past history- not current  . CAD (coronary artery disease) CARDIOLOGIST--  DR Angelena Form   mild non-obstructive cad  . Cancer ([place])    right  . Cataract    bilaterally removed   . Chronic constipation   . Chronic kidney disease    interstitial cystitis  . Concussion    x 3  . COPD (chronic obstructive pulmonary disease) ([place])   . Depression    past hx   . Family history of malignant hyperthermia    father had this  . Fibromyalgia   . Frequency of urination   . GERD (gastroesophageal reflux disease)   . H/O hiatal hernia   . History of basal cell carcinoma excision    X2  . History of breast cancer ONCOLOGIST-- DR Jana Hakim---  NO RECURRANCE   DX 07/2012;  LOW GRADE DCIS  ER+PR+  ----  S/P RIGHT LUMPECTOMY WITH NEGATIVE MARGINS/   RADIATION ENDED 11/2012  . History of chronic bronchitis   . History of colonic polyps   . History of tachycardia    CONTROLLED  WITH ATENOLOL  . Hyperlipidemia   . Hypertension   . Neuromuscular disorder (HCC)    fibromyalgia  . Neuropathy   . Osteoporosis 01/2019   T score -2.2  stable/improved from prior study  . Pelvic pain   . Personal history of radiation therapy   . S/P radiation therapy 11/12/12 - 12/05/12   right Breast  . Sepsis ([place]) 2014   from UTI   . Sinus headache   . Urgency of urination     Past Surgical History:  Procedure Laterality Date  . [place] STUDY N/A 04/03/2016   Procedure: [place] STUDY;  Surgeon: Manus Gunning, MD;  Location: [facility];  Service: Gastroenterology;  Laterality: N/A;  . BREAST LUMPECTOMY Right 10-11-2012   W/ SLN BX  . CARDIAC CATHETERIZATION  09-13-2007  DR Lia Foyer   WELL-PRESERVED LVF/  DIFFUSE SCATTERED CORONARY CALCIFACATION AND ATHEROSCLEROSIS WITHOUT OBSTRUCTION  . CARDIAC CATHETERIZATION  08-04-2010  DR Angelena Form   NON-OBSTRUCTIVE CAD/  pLAD 40%/  oLAD 30%/  mLAD 30%/  pRCA 30%/  EF 60%  . CARDIOVASCULAR STRESS TEST  06-18-2012  DR McALHANY   LOW RISK NUCLEAR STUDY/  SMALL FIXED AREA OF MODERATELY DECREASED UPTAKE IN ANTEROSEPTAL WALL WHICH MAY BE ARTIFACTUAL/  NO ISCHEMIA/  EF 68%  . COLONOSCOPY  09-29-2010  .  CYSTOSCOPY    . CYSTOSCOPY WITH HYDRODISTENSION AND BIOPSY N/A 03/06/2014   Procedure: CYSTOSCOPY/HYDRODISTENSION/ INSTILATION OF MARCAINE AND PYRIDIUM;  Surgeon: Ailene Rud, MD;  Location: [facility];  Service: Urology;  Laterality: N/A;  . ESOPHAGEAL MANOMETRY N/A 04/03/2016   Procedure: ESOPHAGEAL MANOMETRY (EM);  Surgeon: Manus Gunning, MD;  Location: [facility];  Service: Gastroenterology;  Laterality: N/A;  . EXTRACORPOREAL SHOCK WAVE LITHOTRIPSY Left 02/06/2019   Procedure: EXTRACORPOREAL SHOCK WAVE LITHOTRIPSY (ESWL);  Surgeon: Kathie Rhodes, MD;  Location: [facility];  Service: Urology;  Laterality: Left;  . NASAL SINUS SURGERY  1985  . ORIF RIGHT ANKLE  FX  2006  . POLYPECTOMY    . REMOVAL VOCAL CORD CYST  FEB 2014  . RIGHT BREAST BX  08-23-2012  . RIGHT HAND SURGERY  X3  LAST ONE 2009   INCLUDES  ORIF RIGHT 5TH FINGER AND REVISION TWICE  .  SKIN CANCER EXCISION    . TONSILLECTOMY AND ADENOIDECTOMY  AGE 60  . TOTAL ABDOMINAL HYSTERECTOMY W/ BILATERAL SALPINGOOPHORECTOMY  1982   W/  APPENDECTOMY  . TRANSTHORACIC ECHOCARDIOGRAM  06-24-2012   GRADE I DIASTOLIC DYSFUNCTION/  EF 55-60%/  MILD MR  . UPPER GASTROINTESTINAL ENDOSCOPY      There were no vitals filed for this visit.   Subjective Assessment - 01/03/21 1028    Subjective Pt reports she has found a rollator that she likes but is waiting for the MD prescription so that she can check with her insurance regarding coverage for the rollator before ordering it.    Pertinent History Recurrent falls - 3 in past 6 months, 7 major falls in past 5-6 yrs (4 concussions, finger fracture); costochondritis, cellulitis of R breast, fibromyalgia, chronic LBP since ~age 74, DDD, sciatica, osteoporosis, degenerative scoliosis, peripheral neuropathy, vertigo, R knee pain, CAD, COPD, HTN, h/o lumpectomy for R breast cancer 2013, ORIF R ankle s/p fall 2006    Patient Stated Goals "Be able to gain some strength in my legs to help with my back. To be able stand w/o feeling like I'm swaying and walk w/o tripping."    Currently in Pain? Yes    Pain Score 4     Pain Location Back    Pain Orientation Lower    Pain Descriptors / Indicators Sore    Pain Score 5    Pain Location Head    Pain Orientation Left    Pain Descriptors / Indicators Headache    Pain Type Chronic pain              [facility] PT Assessment - 01/03/21 0001      Strength   Right Hip Flexion 4-/5    Right Hip Extension 3+/5    Right Hip External Rotation  4-/5    Right Hip Internal Rotation 4/5    Right Hip ABduction 4/5    Right Hip ADduction 3+/5    Left Hip Flexion 4-/5    Left Hip Extension 4-/5    Left Hip External Rotation 4-/5    Left Hip Internal Rotation 4/5    Left Hip ABduction 4/5    Left Hip ADduction 4-/5    Right Knee Flexion 4+/5    Right Knee Extension 4+/5    Left Knee Flexion 4+/5    Left Knee  Extension 4+/5    Right Ankle Dorsiflexion 4-/5    Right Ankle Plantar Flexion 4-/5    Right Ankle Inversion 4/5    Right Ankle Eversion  4/5    Left Ankle Dorsiflexion 4-/5    Left Ankle Plantar Flexion 4-/5    Left Ankle Inversion 4/5    Left Ankle Eversion 4/5                         [facility] Adult PT Treatment/Exercise - 01/03/21 1024      Transfers   Transfers Sit to Stand;Stand to Sit    Sit to Stand 5: Supervision;With upper extremity assist   w & w/o srmrests   Sit to Stand Details Verbal cues for safe use of DME/AE;Verbal cues for technique;Verbal cues for precautions/safety    Sit to Stand Details (indicate cue type and reason) cues for proper hand placement pushing up from seat    Stand to Sit 5: Supervision    Stand to Sit Details (indicate cue type and reason) Verbal cues for safe use of DME/AE;Verbal cues for precautions/safety;Verbal cues for technique    Stand to Sit Details pt demonstrating good awareness of locking brakes but cues necessary to release rollator and reach back for seat      Ambulation/Gait   Ambulation/Gait Assistance 5: Supervision    Ambulation Distance (Feet) 800 Feet    Assistive device Rollator    Gait Pattern Within Functional Limits;Step-through pattern    Ambulation Surface Level;Indoor;Outdoor;Unlevel;Paved;Gravel;Grass    Door Management 5: Supervision    Door Managment Details (indicate cue type and reason) cues to position rollator to side of door and swing walker around as door opens    Ramp 5: Supervision    Ramp Details (indicate cue type and reason) cues for rollator proximity and use of rollator brakes to slow descent as needed    Curb 5: Supervision    Curb Details (indicate cue type and reason) cues for proper alignment of rollator relative to curb as well as use of brakes while stepping off/onto curb      High Level Balance   High Level Balance Activities Backward walking;Tandem walking   using rollator for support    High Level Balance Comments Walking with eyes closed with rollator - tendency to veer significantly to R on 1st attempt, mildly to L on 2nd attempt      Knee/Hip Exercises: Aerobic   Nustep L5 x 6 min (UE/LE)      Knee/Hip Exercises: Standing   Heel Raises Both;20 reps;3 seconds    Heel Raises Limitations alt single leg eccentirc lowering                  PT Education - 01/03/21 1116    Education Details [place] progression - B heel raises to include alt single LE eccentric lowering; Rollator safety with outdoor ambulation    Person(s) Educated Patient    Methods Explanation;Demonstration;Verbal cues;Handout    Comprehension Verbalized understanding;Verbal cues required;Returned demonstration;Need further instruction            PT Short Term Goals - 06/22/20 1015      PT SHORT TERM GOAL #1   Title Patient will be independent with initial HEP    Status Achieved   06/22/20     PT SHORT TERM GOAL #2   Title Complete FGA assessment    Status Achieved   06/22/20            PT Long Term Goals - 01/03/21 1100      PT LONG TERM GOAL #1   Title Patient will be independent with ongoing/advanced HEP  Status Partially Met    Target Date 01/31/21      PT LONG TERM GOAL #2   Title Patient will demonstrate improved B LE strength to >/= 4/5 to 4+/5 for improved stability and ease of mobility    Status Partially Met    Target Date 01/31/21      PT LONG TERM GOAL #3   Title Patient will improve Berg score to >/= 52/56 to improve safety stability with ADLs in standing and reduce risk for falls    Baseline perviously met on 09/16/20 - Berg = 52/56, but pt has regressed to 46/56 as of 12/09/20    Status On-going    Target Date 01/31/21      PT LONG TERM GOAL #4   Title Patient will report no further instances of falls    Status Partially Met   10/14/20 - no falls since 09/02/20, but continued LOB   Target Date 01/31/21      PT LONG TERM GOAL #5   Title Patient will improve  FGA score to >/= 24/30 to improve gait stability and reduce risk for falls    Baseline perviously met on 10/11/20 - FGA = 21/30, but pt has regressed to 15/30 as of 12/09/20    Status On-going    Target Date 01/31/21      PT LONG TERM GOAL #6   Title Patient to report no dizziness remaining with bed mobility.    Status Achieved   met as of 08/24/20; Will reassess as indicated     PT LONG TERM GOAL #7   Title Patient will demonstrate safe gait pattern >500 ft with rollator on all surfaces to reduce risk for falls    Status Achieved   01/03/21                Plan - 01/03/21 1116    Clinical Impression Statement [name] reports she has decided to proceed with rollator purchase and has picked out the style she likes but is awaiting an MD prescription to verify insurance coverage. Reviewed rollator safety with transfers and gait both on level surfaces indoors including door management as well as level and uneven surfaces outdoors including inclines, curbs, grass and gravel with pt able to safely navigate all surfaces (LTG #7 met). Rollator also used to work on tandem gait, walking backwards and gait with eyes closed with improved stability noted in all activities. Overall LE strength improving per MMT (LTG #2 partially met), however pt still noting fatigue with prolonged standing activities such as with singing in the choir at church therefore progress heel raises to alternating single leg eccentric lowering and will further reevaluate [name] program to address progression of strengthening activities.    Comorbidities Recurrent falls - at least 5 falls in past 6 months, 8-9 since start of year & 7 major falls in past 5-6 yrs (4 concussions, finger fracture); costochondritis, cellulitis of R breast, fibromyalgia, chronic LBP since ~age 23, DDD, sciatica, osteoporosis, degenerative scoliosis, peripheral neuropathy, vertigo, R knee pain, CAD, COPD, HTN, h/o lumpectomy for R breast cancer 2013, ORIF R  ankle s/p fall 2006    Stability/Clinical Decision Making Evolving/Moderate complexity    Rehab Potential Good    PT Frequency 2x / week    PT Duration 8 weeks    PT Treatment/Interventions ADLs/Self Care Home Management;Cryotherapy;Electrical Stimulation;Iontophoresis 4mg /ml Dexamethasone;Moist Heat;Ultrasound;DME Instruction;Gait training;Stair training;Functional mobility training;Therapeutic activities;Therapeutic exercise;Balance training;Neuromuscular re-education;Patient/family education;Manual techniques;Passive range of motion;Dry needling;Taping;Vasopneumatic Device;Vestibular;Visual/perceptual remediation/compensation;Spinal Manipulations    PT  Next Visit Plan Progress strengthening component of [place] program; Continue to address fall risk with balance and dynamic gait training along with review of [place] Fall Prevention Program PRN; Gait training with rollator as indicated, Lumbopelvic strengthening and stabilization - HEP review/update PRN; Manual therapy & modalities for pain    PT Home Exercise Plan 10/27 - [place] Prevention Program; 12/13 - seated 3-way prayer stretch; 01/03/21 - [place] progression - B heel raises to include alt single LE eccentric lowering    Consulted and Agree with Plan of Care Patient           Patient will benefit from skilled therapeutic intervention in order to improve the following deficits and impairments:  Abnormal gait,Decreased activity tolerance,Decreased balance,Decreased coordination,Decreased endurance,Decreased knowledge of precautions,Decreased mobility,Decreased safety awareness,Decreased strength,Difficulty walking,Dizziness,Increased fascial restricitons,Increased muscle spasms,Impaired perceived functional ability,Impaired flexibility,Improper body mechanics,Postural dysfunction,Pain  Visit Diagnosis: Repeated falls  Unsteadiness on feet  Muscle weakness (generalized)  Other abnormalities of gait and mobility  Dizziness and  giddiness  Chronic bilateral low back pain without sciatica  Intractable acute post-traumatic headache  Other symptoms and signs involving the nervous system     Problem List Patient Active Problem List   Diagnosis Date Noted  . Low back pain 09/08/2020  . OSA and COPD overlap syndrome ([place]) 06/10/2020  . Serotonin withdrawal syndrome without complication 09/73/5329  . Recurrent falls 05/02/2020  . Statin intolerance 02/29/2020  . RLS (restless legs syndrome) 11/09/2019  . Kidney stone 02/26/2019  . Recurrent sinusitis 02/25/2019  . Hx of colonic polyp 02/25/2019  . DDD (degenerative disc disease), cervical 09/17/2018  . Degenerative scoliosis 04/22/2018  . Primary insomnia 06/25/2017  . Chronic interstitial cystitis 02/01/2017  . Cough 01/09/2017  . Right arm pain 07/20/2016  . Deviated septum 05/12/2016  . Nasal turbinate hypertrophy 05/12/2016  . Dysphagia   . Allergic rhinitis 01/25/2016  . Other and combined forms of senile cataract 07/15/2015  . Dyspnea   . Left hip pain 04/30/2015  . Sciatica 04/30/2015  . Knee pain, right 04/30/2015  . Bilateral carpal tunnel syndrome 03/04/2015  . Hyperlipidemia 03/01/2015  . Pulmonary nodules 02/12/2015  . External hemorrhoids 02/05/2015  . Chronic night sweats 01/08/2015  . Atypical chest pain 01/01/2015  . Renal insufficiency 07/16/2014  . Memory loss 05/22/2014  . Peripheral neuropathy 05/22/2014  . Abdominal pain 10/26/2013  . Headache 10/13/2013  . Arthralgia 08/18/2013  . ANA positive 07/29/2013  . Depression 02/05/2013  . Family history of malignant hyperthermia 02/05/2013  . Malignant neoplasm of lower-outer quadrant of right breast of female, estrogen receptor positive ([place]) 01/03/2013  . Splenic lesion 09/02/2012  . Asthma, allergic 08/05/2012  . GERD (gastroesophageal reflux disease) 06/03/2012  . Edema 04/08/2012  . Stricture and stenosis of esophagus 10/19/2011  . Functional constipation 07/21/2011  .  Nonspecific (abnormal) findings on radiological and other examination of biliary tract 07/21/2011  . Osteoporosis 04/17/2011  . Leg pain 02/24/2011  . FIBROCYSTIC BREAST DISEASE, HX OF 10/18/2010  . Essential hypertension 08/25/2010  . CAD in native artery 08/25/2010  . TOBACCO ABUSE, HX OF 08/25/2010  . GENERALIZED ANXIETY DISORDER 08/03/2010  . Fibromyalgia 01/11/2010    Percival Spanish, PT, MPT 01/03/2021, 11:51 AM  [facility] [address]  [place], [place], [address] Phone: [phone]   Fax:  [phone]  Name: [name] MRN: [id number] Date of Birth: 06-03-1946

## 2021-01-03 NOTE — Patient Instructions (Signed)
[place] B heel raises modified as below:    Home exercise program created by Annie Paras, PT.  For questions, please contact Lauren Modisette via phone at [phone] or email at [email or web address]  [facility] [place], [place], [address] Phone: [phone]   Fax:  [phone]

## 2021-01-06 ENCOUNTER — Encounter: Payer: Self-pay | Admitting: Physical Therapy

## 2021-01-06 ENCOUNTER — Ambulatory Visit: Payer: Medicare Other | Admitting: Physical Therapy

## 2021-01-06 ENCOUNTER — Other Ambulatory Visit: Payer: Self-pay

## 2021-01-06 DIAGNOSIS — R42 Dizziness and giddiness: Secondary | ICD-10-CM | POA: Diagnosis not present

## 2021-01-06 DIAGNOSIS — M6281 Muscle weakness (generalized): Secondary | ICD-10-CM | POA: Diagnosis not present

## 2021-01-06 DIAGNOSIS — R2681 Unsteadiness on feet: Secondary | ICD-10-CM | POA: Diagnosis not present

## 2021-01-06 DIAGNOSIS — R296 Repeated falls: Secondary | ICD-10-CM | POA: Diagnosis not present

## 2021-01-06 DIAGNOSIS — R2689 Other abnormalities of gait and mobility: Secondary | ICD-10-CM

## 2021-01-06 DIAGNOSIS — R29818 Other symptoms and signs involving the nervous system: Secondary | ICD-10-CM

## 2021-01-06 DIAGNOSIS — G8929 Other chronic pain: Secondary | ICD-10-CM

## 2021-01-06 DIAGNOSIS — M545 Low back pain, unspecified: Secondary | ICD-10-CM

## 2021-01-06 DIAGNOSIS — G44311 Acute post-traumatic headache, intractable: Secondary | ICD-10-CM

## 2021-01-06 NOTE — Therapy (Signed)
[facility] [place] [address]  [place], [place], [address] Phone: [phone]   Fax:  [phone]  Physical Therapy Treatment  Patient Details  Name: [name] MRN: [id number] Date of Birth: [birth year] Referring Provider (PT): [name], MD & Darryl Nestle, MD   Encounter Date: 01/06/2021   PT End of Session - 01/06/21 1015    Visit Number [id number]    Number of Visits 89    Date for PT Re-Evaluation 01/31/21    Authorization Type [employer]    PT Start Time 6962    PT Stop Time 1102    PT Time Calculation (min) 47 min    Activity Tolerance Patient tolerated treatment well    Behavior During Therapy [facility] for tasks assessed/performed           Past Medical History:  Diagnosis Date  . Allergy   . Anemia   . Anxiety    past hx   . Arthritis   . Asthma   . Atrial flutter ([place])    past history- not current  . CAD (coronary artery disease) CARDIOLOGIST--  DR Angelena Form   mild non-obstructive cad  . Cancer ([place])    right  . Cataract    bilaterally removed   . Chronic constipation   . Chronic kidney disease    interstitial cystitis  . Concussion    x 3  . COPD (chronic obstructive pulmonary disease) ([place])   . Depression    past hx   . Family history of malignant hyperthermia    father had this  . Fibromyalgia   . Frequency of urination   . GERD (gastroesophageal reflux disease)   . H/O hiatal hernia   . History of basal cell carcinoma excision    X2  . History of breast cancer ONCOLOGIST-- DR Jana Hakim---  NO RECURRANCE   DX 07/2012;  LOW GRADE DCIS  ER+PR+  ----  S/P RIGHT LUMPECTOMY WITH NEGATIVE MARGINS/   RADIATION ENDED 11/2012  . History of chronic bronchitis   . History of colonic polyps   . History of tachycardia    CONTROLLED  WITH ATENOLOL  . Hyperlipidemia   . Hypertension   . Neuromuscular disorder (HCC)    fibromyalgia  . Neuropathy   . Osteoporosis 01/2019   T score -2.2  stable/improved from prior study  . Pelvic pain   . Personal history of radiation therapy   . S/P radiation therapy 11/12/12 - 12/05/12   right Breast  . Sepsis ([place]) 2014   from UTI   . Sinus headache   . Urgency of urination     Past Surgical History:  Procedure Laterality Date  . [place] STUDY N/A 04/03/2016   Procedure: [place] STUDY;  Surgeon: Manus Gunning, MD;  Location: [facility];  Service: Gastroenterology;  Laterality: N/A;  . BREAST LUMPECTOMY Right 10-11-2012   W/ SLN BX  . CARDIAC CATHETERIZATION  09-13-2007  DR Lia Foyer   WELL-PRESERVED LVF/  DIFFUSE SCATTERED CORONARY CALCIFACATION AND ATHEROSCLEROSIS WITHOUT OBSTRUCTION  . CARDIAC CATHETERIZATION  08-04-2010  DR Angelena Form   NON-OBSTRUCTIVE CAD/  pLAD 40%/  oLAD 30%/  mLAD 30%/  pRCA 30%/  EF 60%  . CARDIOVASCULAR STRESS TEST  06-18-2012  DR McALHANY   LOW RISK NUCLEAR STUDY/  SMALL FIXED AREA OF MODERATELY DECREASED UPTAKE IN ANTEROSEPTAL WALL WHICH MAY BE ARTIFACTUAL/  NO ISCHEMIA/  EF 68%  . COLONOSCOPY  09-29-2010  .  CYSTOSCOPY    . CYSTOSCOPY WITH HYDRODISTENSION AND BIOPSY N/A 03/06/2014   Procedure: CYSTOSCOPY/HYDRODISTENSION/ INSTILATION OF MARCAINE AND PYRIDIUM;  Surgeon: Ailene Rud, MD;  Location: [facility];  Service: Urology;  Laterality: N/A;  . ESOPHAGEAL MANOMETRY N/A 04/03/2016   Procedure: ESOPHAGEAL MANOMETRY (EM);  Surgeon: Manus Gunning, MD;  Location: [facility];  Service: Gastroenterology;  Laterality: N/A;  . EXTRACORPOREAL SHOCK WAVE LITHOTRIPSY Left 02/06/2019   Procedure: EXTRACORPOREAL SHOCK WAVE LITHOTRIPSY (ESWL);  Surgeon: Kathie Rhodes, MD;  Location: [facility];  Service: Urology;  Laterality: Left;  . NASAL SINUS SURGERY  1985  . ORIF RIGHT ANKLE  FX  2006  . POLYPECTOMY    . REMOVAL VOCAL CORD CYST  FEB 2014  . RIGHT BREAST BX  08-23-2012  . RIGHT HAND SURGERY  X3  LAST ONE 2009   INCLUDES  ORIF RIGHT 5TH FINGER AND REVISION TWICE  .  SKIN CANCER EXCISION    . TONSILLECTOMY AND ADENOIDECTOMY  AGE 16  . TOTAL ABDOMINAL HYSTERECTOMY W/ BILATERAL SALPINGOOPHORECTOMY  1982   W/  APPENDECTOMY  . TRANSTHORACIC ECHOCARDIOGRAM  06-24-2012   GRADE I DIASTOLIC DYSFUNCTION/  EF 55-60%/  MILD MR  . UPPER GASTROINTESTINAL ENDOSCOPY      There were no vitals filed for this visit.   Subjective Assessment - 01/06/21 1019    Subjective Pt reports she still feels weak and doesn't feel like the B-12 shots have kicked in yet. She notes her ankles have been giving way more recently.    Pertinent History Recurrent falls - 3 in past 6 months, 7 major falls in past 5-6 yrs (4 concussions, finger fracture); costochondritis, cellulitis of R breast, fibromyalgia, chronic LBP since ~age 26, DDD, sciatica, osteoporosis, degenerative scoliosis, peripheral neuropathy, vertigo, R knee pain, CAD, COPD, HTN, h/o lumpectomy for R breast cancer 2013, ORIF R ankle s/p fall 2006    Patient Stated Goals "Be able to gain some strength in my legs to help with my back. To be able stand w/o feeling like I'm swaying and walk w/o tripping."    Currently in Pain? No/denies                             [facility] Adult PT Treatment/Exercise - 01/06/21 1015      Lumbar Exercises: Stretches   Passive Hamstring Stretch Right;Left;30 seconds;2 reps    Passive Hamstring Stretch Limitations seated hip hinge      Knee/Hip Exercises: Aerobic   Nustep L5 x 6 min (UE/LE)      Knee/Hip Exercises: Standing   Knee Flexion Right;Left;10 reps;Strengthening    Knee Flexion Limitations looped yellow TB at ankles; UE support on back of chair    Hip Flexion Right;Left;10 reps;Stengthening;Knee straight    Hip Flexion Limitations looped yellow TB at ankles; UE support on back of chair    Hip Abduction Right;Left;10 reps;Stengthening;Knee straight    Abduction Limitations looped yellow TB at ankles; UE support on back of chair    Hip Extension Right;Left;10  reps;Stengthening;Knee straight    Extension Limitations looped yellow TB at ankles; UE support on back of chair    Functional Squat 10 reps;5 seconds    Functional Squat Limitations looped yellow TB at knees; UE support on back of chair      Knee/Hip Exercises: Seated   Sit to Sand 10 reps;without UE support   + yellow TB hip ABD isometic; from mat table with  cueing for slow motion and avoiding IR/ADD at hips              Balance Exercises - 01/06/21 1015      [place] PROGRAM   Knee Extensor 20 reps;Weight (comment)   2#   Knee Flexor 20 reps;Weight (comment)   2#   Hip ABductor 10 reps;Weight (comment)   2#              PT Short Term Goals - 06/22/20 1015      PT SHORT TERM GOAL #1   Title Patient will be independent with initial HEP    Status Achieved   06/22/20     PT SHORT TERM GOAL #2   Title Complete FGA assessment    Status Achieved   06/22/20            PT Long Term Goals - 01/03/21 1100      PT LONG TERM GOAL #1   Title Patient will be independent with ongoing/advanced HEP    Status Partially Met    Target Date 01/31/21      PT LONG TERM GOAL #2   Title Patient will demonstrate improved B LE strength to >/= 4/5 to 4+/5 for improved stability and ease of mobility    Status Partially Met    Target Date 01/31/21      PT LONG TERM GOAL #3   Title Patient will improve Berg score to >/= 52/56 to improve safety stability with ADLs in standing and reduce risk for falls    Baseline perviously met on 09/16/20 - Berg = 52/56, but pt has regressed to 46/56 as of 12/09/20    Status On-going    Target Date 01/31/21      PT LONG TERM GOAL #4   Title Patient will report no further instances of falls    Status Partially Met   10/14/20 - no falls since 09/02/20, but continued LOB   Target Date 01/31/21      PT LONG TERM GOAL #5   Title Patient will improve FGA score to >/= 24/30 to improve gait stability and reduce risk for falls    Baseline perviously met on  10/11/20 - FGA = 21/30, but pt has regressed to 15/30 as of 12/09/20    Status On-going    Target Date 01/31/21      PT LONG TERM GOAL #6   Title Patient to report no dizziness remaining with bed mobility.    Status Achieved   met as of 08/24/20; Will reassess as indicated     PT LONG TERM GOAL #7   Title Patient will demonstrate safe gait pattern >500 ft with rollator on all surfaces to reduce risk for falls    Status Achieved   01/03/21                Plan - 01/06/21 [place] denies any pain or headache today, noting that she has been successfully managing her back pain with her HEP stretches and exercises. Today's session focusing on progression of strengthening component of [facility] with options provided for use of cuff weights and resistance bands (yellow) with pt preferring the cuff weights but stating she has both available at home.    Comorbidities Recurrent falls - at least 5 falls in past 6 months, 8-9 since start of year & 7 major falls in past 5-6 yrs (4 concussions, finger fracture); costochondritis, cellulitis of R breast, fibromyalgia, chronic  LBP since ~age 27, DDD, sciatica, osteoporosis, degenerative scoliosis, peripheral neuropathy, vertigo, R knee pain, CAD, COPD, HTN, h/o lumpectomy for R breast cancer 2013, ORIF R ankle s/p fall 2006    Stability/Clinical Decision Making Evolving/Moderate complexity    Rehab Potential Good    PT Frequency 2x / week    PT Duration 8 weeks    PT Treatment/Interventions ADLs/Self Care Home Management;Cryotherapy;Electrical Stimulation;Iontophoresis 4mg /ml Dexamethasone;Moist Heat;Ultrasound;DME Instruction;Gait training;Stair training;Functional mobility training;Therapeutic activities;Therapeutic exercise;Balance training;Neuromuscular re-education;Patient/family education;Manual techniques;Passive range of motion;Dry needling;Taping;Vasopneumatic Device;Vestibular;Visual/perceptual  remediation/compensation;Spinal Manipulations    PT Next Visit Plan Continue to address fall risk with balance and dynamic gait training along with review/progression of balance portion of [place] Fall Prevention Program PRN; Gait training with rollator as indicated, Lumbopelvic strengthening and stabilization - HEP review/update PRN; Manual therapy & modalities for pain    PT Home Exercise Plan 10/27 - [place] Prevention Program; 12/13 - seated 3-way prayer stretch; 01/03/21 - [place] progression - B heel raises to include alt single LE eccentric lowering; 1/13 - addition of yellow TB or cuff weight to [place] strengthening exercises    Consulted and Agree with Plan of Care Patient           Patient will benefit from skilled therapeutic intervention in order to improve the following deficits and impairments:  Abnormal gait,Decreased activity tolerance,Decreased balance,Decreased coordination,Decreased endurance,Decreased knowledge of precautions,Decreased mobility,Decreased safety awareness,Decreased strength,Difficulty walking,Dizziness,Increased fascial restricitons,Increased muscle spasms,Impaired perceived functional ability,Impaired flexibility,Improper body mechanics,Postural dysfunction,Pain  Visit Diagnosis: Repeated falls  Unsteadiness on feet  Muscle weakness (generalized)  Other abnormalities of gait and mobility  Dizziness and giddiness  Chronic bilateral low back pain without sciatica  Intractable acute post-traumatic headache  Other symptoms and signs involving the nervous system     Problem List Patient Active Problem List   Diagnosis Date Noted  . Low back pain 09/08/2020  . OSA and COPD overlap syndrome ([place]) 06/10/2020  . Serotonin withdrawal syndrome without complication 28/76/8115  . Recurrent falls 05/02/2020  . Statin intolerance 02/29/2020  . RLS (restless legs syndrome) 11/09/2019  . Kidney stone 02/26/2019  . Recurrent sinusitis 02/25/2019  . Hx of  colonic polyp 02/25/2019  . DDD (degenerative disc disease), cervical 09/17/2018  . Degenerative scoliosis 04/22/2018  . Primary insomnia 06/25/2017  . Chronic interstitial cystitis 02/01/2017  . Cough 01/09/2017  . Right arm pain 07/20/2016  . Deviated septum 05/12/2016  . Nasal turbinate hypertrophy 05/12/2016  . Dysphagia   . Allergic rhinitis 01/25/2016  . Other and combined forms of senile cataract 07/15/2015  . Dyspnea   . Left hip pain 04/30/2015  . Sciatica 04/30/2015  . Knee pain, right 04/30/2015  . Bilateral carpal tunnel syndrome 03/04/2015  . Hyperlipidemia 03/01/2015  . Pulmonary nodules 02/12/2015  . External hemorrhoids 02/05/2015  . Chronic night sweats 01/08/2015  . Atypical chest pain 01/01/2015  . Renal insufficiency 07/16/2014  . Memory loss 05/22/2014  . Peripheral neuropathy 05/22/2014  . Abdominal pain 10/26/2013  . Headache 10/13/2013  . Arthralgia 08/18/2013  . ANA positive 07/29/2013  . Depression 02/05/2013  . Family history of malignant hyperthermia 02/05/2013  . Malignant neoplasm of lower-outer quadrant of right breast of female, estrogen receptor positive ([place]) 01/03/2013  . Splenic lesion 09/02/2012  . Asthma, allergic 08/05/2012  . GERD (gastroesophageal reflux disease) 06/03/2012  . Edema 04/08/2012  . Stricture and stenosis of esophagus 10/19/2011  . Functional constipation 07/21/2011  . Nonspecific (abnormal) findings on radiological and other examination of biliary tract 07/21/2011  .  Osteoporosis 04/17/2011  . Leg pain 02/24/2011  . FIBROCYSTIC BREAST DISEASE, HX OF 10/18/2010  . Essential hypertension 08/25/2010  . CAD in native artery 08/25/2010  . TOBACCO ABUSE, HX OF 08/25/2010  . GENERALIZED ANXIETY DISORDER 08/03/2010  . Fibromyalgia 01/11/2010    Percival Spanish, PT, MPT 01/06/2021, 1:36 PM  [facility] [address]  [place], [place], [address] Phone:  [phone]   Fax:  [phone]  Name: MATTISYN CARDONA MRN: [id number] Date of Birth: 08/31/1946

## 2021-01-10 ENCOUNTER — Ambulatory Visit: Payer: Medicare Other

## 2021-01-11 ENCOUNTER — Ambulatory Visit: Payer: Medicare Other | Admitting: Obstetrics and Gynecology

## 2021-01-11 ENCOUNTER — Telehealth: Payer: Self-pay | Admitting: *Deleted

## 2021-01-11 NOTE — Telephone Encounter (Signed)
Prolia insurance verification has been sent awaiting Summary of benefits  

## 2021-01-13 ENCOUNTER — Ambulatory Visit: Payer: Medicare Other | Admitting: Physical Therapy

## 2021-01-13 ENCOUNTER — Encounter: Payer: Self-pay | Admitting: Physical Therapy

## 2021-01-13 ENCOUNTER — Other Ambulatory Visit: Payer: Self-pay

## 2021-01-13 DIAGNOSIS — R296 Repeated falls: Secondary | ICD-10-CM

## 2021-01-13 DIAGNOSIS — R42 Dizziness and giddiness: Secondary | ICD-10-CM | POA: Diagnosis not present

## 2021-01-13 DIAGNOSIS — G44311 Acute post-traumatic headache, intractable: Secondary | ICD-10-CM | POA: Diagnosis not present

## 2021-01-13 DIAGNOSIS — R2681 Unsteadiness on feet: Secondary | ICD-10-CM | POA: Diagnosis not present

## 2021-01-13 DIAGNOSIS — R29818 Other symptoms and signs involving the nervous system: Secondary | ICD-10-CM | POA: Diagnosis not present

## 2021-01-13 DIAGNOSIS — M545 Low back pain, unspecified: Secondary | ICD-10-CM | POA: Diagnosis not present

## 2021-01-13 DIAGNOSIS — M6281 Muscle weakness (generalized): Secondary | ICD-10-CM | POA: Diagnosis not present

## 2021-01-13 DIAGNOSIS — R2689 Other abnormalities of gait and mobility: Secondary | ICD-10-CM

## 2021-01-13 DIAGNOSIS — G8929 Other chronic pain: Secondary | ICD-10-CM | POA: Diagnosis not present

## 2021-01-13 NOTE — Patient Instructions (Signed)
    Home exercise program created by Sterling Ucci, PT.  For questions, please contact Patti Shorb via phone at [phone] or email at [email or web address]  [facility] [place] [address]  [address] [place], , [address] Phone: [phone]   Fax:  [phone]    

## 2021-01-13 NOTE — Therapy (Addendum)
[facility] [address]  [place], [place], [address] Phone: [phone]   Fax:  [phone]  Physical Therapy Treatment  Patient Details  Name: [name] MRN: [id number] Date of Birth: 01-01-1946 Referring Provider (PT): [name], MD & Darryl Nestle, MD   Encounter Date: 01/13/2021   PT End of Session - 01/13/21 1018    Visit Number 35    Number of Visits 46    Date for PT Re-Evaluation 01/31/21    Authorization Type [employer]    PT Start Time 1018    PT Stop Time 1102    PT Time Calculation (min) 44 min    Activity Tolerance Patient tolerated treatment well    Behavior During Therapy [facility] for tasks assessed/performed           Past Medical History:  Diagnosis Date  . Allergy   . Anemia   . Anxiety    past hx   . Arthritis   . Asthma   . Atrial flutter ([employer])    past history- not current  . CAD (coronary artery disease) CARDIOLOGIST--  DR Angelena Form   mild non-obstructive cad  . Cancer ([place])    right  . Cataract    bilaterally removed   . Chronic constipation   . Chronic kidney disease    interstitial cystitis  . Concussion    x 3  . COPD (chronic obstructive pulmonary disease) ([place])   . Depression    past hx   . Family history of malignant hyperthermia    father had this  . Fibromyalgia   . Frequency of urination   . GERD (gastroesophageal reflux disease)   . H/O hiatal hernia   . History of basal cell carcinoma excision    X2  . History of breast cancer ONCOLOGIST-- DR Jana Hakim---  NO RECURRANCE   DX 07/2012;  LOW GRADE DCIS  ER+PR+  ----  S/P RIGHT LUMPECTOMY WITH NEGATIVE MARGINS/   RADIATION ENDED 11/2012  . History of chronic bronchitis   . History of colonic polyps   . History of tachycardia    CONTROLLED  WITH ATENOLOL  . Hyperlipidemia   . Hypertension   . Neuromuscular disorder (HCC)    fibromyalgia  . Neuropathy   . Osteoporosis 01/2019   T score -2.2  stable/improved from prior study  . Pelvic pain   . Personal history of radiation therapy   . S/P radiation therapy 11/12/12 - 12/05/12   right Breast  . Sepsis ([place]) 2014   from UTI   . Sinus headache   . Urgency of urination     Past Surgical History:  Procedure Laterality Date  . [place] STUDY N/A 04/03/2016   Procedure: [place] STUDY;  Surgeon: Manus Gunning, MD;  Location: [facility];  Service: Gastroenterology;  Laterality: N/A;  . BREAST LUMPECTOMY Right 10-11-2012   W/ SLN BX  . CARDIAC CATHETERIZATION  09-13-2007  DR Lia Foyer   WELL-PRESERVED LVF/  DIFFUSE SCATTERED CORONARY CALCIFACATION AND ATHEROSCLEROSIS WITHOUT OBSTRUCTION  . CARDIAC CATHETERIZATION  08-04-2010  DR Angelena Form   NON-OBSTRUCTIVE CAD/  pLAD 40%/  oLAD 30%/  mLAD 30%/  pRCA 30%/  EF 60%  . CARDIOVASCULAR STRESS TEST  06-18-2012  DR McALHANY   LOW RISK NUCLEAR STUDY/  SMALL FIXED AREA OF MODERATELY DECREASED UPTAKE IN ANTEROSEPTAL WALL WHICH MAY BE ARTIFACTUAL/  NO ISCHEMIA/  EF 68%  . COLONOSCOPY  09-29-2010  .  CYSTOSCOPY    . CYSTOSCOPY WITH HYDRODISTENSION AND BIOPSY N/A 03/06/2014   Procedure: CYSTOSCOPY/HYDRODISTENSION/ INSTILATION OF MARCAINE AND PYRIDIUM;  Surgeon: Ailene Rud, MD;  Location[facility];  Service: Urology;  Laterality: N/A;  . ESOPHAGEAL MANOMETRY N/A 04/03/2016   Procedure: ESOPHAGEAL MANOMETRY (EM);  Surgeon: Manus Gunning, MD;  Location: [facility];  Service: Gastroenterology;  Laterality: N/A;  . EXTRACORPOREAL SHOCK WAVE LITHOTRIPSY Left 02/06/2019   Procedure: EXTRACORPOREAL SHOCK WAVE LITHOTRIPSY (ESWL);  Surgeon: Kathie Rhodes, MD;  Location: [facility];  Service: Urology;  Laterality: Left;  . NASAL SINUS SURGERY  1985  . ORIF RIGHT ANKLE  FX  2006  . POLYPECTOMY    . REMOVAL VOCAL CORD CYST  FEB 2014  . RIGHT BREAST BX  08-23-2012  . RIGHT HAND SURGERY  X3  LAST ONE 2009   INCLUDES  ORIF RIGHT 5TH FINGER AND REVISION TWICE  .  SKIN CANCER EXCISION    . TONSILLECTOMY AND ADENOIDECTOMY  AGE 75  . TOTAL ABDOMINAL HYSTERECTOMY W/ BILATERAL SALPINGOOPHORECTOMY  1982   W/  APPENDECTOMY  . TRANSTHORACIC ECHOCARDIOGRAM  06-24-2012   GRADE I DIASTOLIC DYSFUNCTION/  EF 55-60%/  MILD MR  . UPPER GASTROINTESTINAL ENDOSCOPY      There were no vitals filed for this visit.   Subjective Assessment - 01/13/21 1021    Subjective Pt hurting "all over" today and thinks it may be weather related.    Pertinent History Recurrent falls - 3 in past 6 months, 7 major falls in past 5-6 yrs (4 concussions, finger fracture); costochondritis, cellulitis of R breast, fibromyalgia, chronic LBP since ~age 72, DDD, sciatica, osteoporosis, degenerative scoliosis, peripheral neuropathy, vertigo, R knee pain, CAD, COPD, HTN, h/o lumpectomy for R breast cancer 2013, ORIF R ankle s/p fall 2006    Patient Stated Goals "Be able to gain some strength in my legs to help with my back. To be able stand w/o feeling like I'm swaying and walk w/o tripping."    Currently in Pain? Yes    Pain Score 4    3-4/10   Pain Location Back    Pain Orientation Lower    Pain Descriptors / Indicators Aching    Pain Type Chronic pain    Pain Radiating Towards pain, tingling and numbness in to B LE    Aggravating Factors  cleaning out under cabinets and pushing [place] trunk extension too far                             [facility] Adult PT Treatment/Exercise - 01/13/21 1018      Transfers   Transfers Floor to Transfer    Floor to Transfer 4: Min guard;4: Min assist    Floor to Transfer Details (indicate cue type and reason) provided instructions in safe return to standing from sitting on low stool as pt reporting difficulty getting up after cleaning out her cabinets    Floor to Transfer Details Verbal cues for technique;Manual facilitation for weight shifting      Lumbar Exercises: Stretches   Hip Flexor Stretch Right;Left;30 seconds;2 reps    Hip Flexor  Stretch Limitations mod thomas + opp SKTC    Standing Extension 5 seconds;5 reps   2 sets   Standing Extension Limitations increased pain on 1st set; pain resolved on 2nd set following hip flexor stretching    Quadruped Mid Back Stretch 30 seconds;3 reps    Quadruped Mid Back  Stretch Limitations seated 3-way prayer stretch with cane      Lumbar Exercises: Standing   Other Standing Lumbar Exercises Abd bracing - increased pain noted when pt attempting abd bracing in standing both isolated and with attempted LE exercises - pain reolved following hip flexor stretching      Lumbar Exercises: Supine   Ab Set 10 reps;3 seconds    AB Set Limitations no pain reported following hip flexor stretching                  PT Education - 01/13/21 1100    Education Details HEP update - alternative hip flexor stretch    Person(s) Educated Patient    Methods Explanation;Demonstration;Verbal cues;Handout    Comprehension Verbalized understanding;Verbal cues required;Returned demonstration;Need further instruction            PT Short Term Goals - 06/22/20 1015      PT SHORT TERM GOAL #1   Title Patient will be independent with initial HEP    Status Achieved   06/22/20     PT SHORT TERM GOAL #2   Title Complete FGA assessment    Status Achieved   06/22/20            PT Long Term Goals - 01/03/21 1100      PT LONG TERM GOAL #1   Title Patient will be independent with ongoing/advanced HEP    Status Partially Met    Target Date 01/31/21      PT LONG TERM GOAL #2   Title Patient will demonstrate improved B LE strength to >/= 4/5 to 4+/5 for improved stability and ease of mobility    Status Partially Met    Target Date 01/31/21      PT LONG TERM GOAL #3   Title Patient will improve Berg score to >/= 52/56 to improve safety stability with ADLs in standing and reduce risk for falls    Baseline perviously met on 09/16/20 - Berg = 52/56, but pt has regressed to 46/56 as of 12/09/20     Status On-going    Target Date 01/31/21      PT LONG TERM GOAL #4   Title Patient will report no further instances of falls    Status Partially Met   10/14/20 - no falls since 09/02/20, but continued LOB   Target Date 01/31/21      PT LONG TERM GOAL #5   Title Patient will improve FGA score to >/= 24/30 to improve gait stability and reduce risk for falls    Baseline perviously met on 10/11/20 - FGA = 21/30, but pt has regressed to 15/30 as of 12/09/20    Status On-going    Target Date 01/31/21      PT LONG TERM GOAL #6   Title Patient to report no dizziness remaining with bed mobility.    Status Achieved   met as of 08/24/20; Will reassess as indicated     PT LONG TERM GOAL #7   Title Patient will demonstrate safe gait pattern >500 ft with rollator on all surfaces to reduce risk for falls    Status Achieved   01/03/21                Plan - 01/13/21 1102    Clinical Impression Statement [name] reports increased back and LE after overextending her back during trunk extension as part of the [place] program as well as spending a prolonged time sitting on a low stool  while cleaning and reorganizing her lower kitchen cabinets. Increased hip flexor tightness appears to be contributing to the low back strain with pain resolved and pt able to perform trunk extension and abdominal bracing exercises w/o pain following hip flexor stretching. She reports good tolerance for progression of strengthening component of [place] program with addition of 2# cuff weights and denies need for further review of these exercises. Pt also reporting continued delay in obtaining her rollator due to lack of response from MD in regards to pt request for MD order for rollator via MyChart, therefore faxed a paper order for a rollator to MD office to facilitate order process.    Comorbidities Recurrent falls - at least 5 falls in past 6 months, 8-9 since start of year & 7 major falls in past 5-6 yrs (4 concussions, finger  fracture); costochondritis, cellulitis of R breast, fibromyalgia, chronic LBP since ~age 46, DDD, sciatica, osteoporosis, degenerative scoliosis, peripheral neuropathy, vertigo, R knee pain, CAD, COPD, HTN, h/o lumpectomy for R breast cancer 2013, ORIF R ankle s/p fall 2006    Stability/Clinical Decision Making Evolving/Moderate complexity    Rehab Potential Good    PT Frequency 2x / week    PT Duration 8 weeks    PT Treatment/Interventions ADLs/Self Care Home Management;Cryotherapy;Electrical Stimulation;Iontophoresis 4mg /ml Dexamethasone;Moist Heat;Ultrasound;DME Instruction;Gait training;Stair training;Functional mobility training;Therapeutic activities;Therapeutic exercise;Balance training;Neuromuscular re-education;Patient/family education;Manual techniques;Passive range of motion;Dry needling;Taping;Vasopneumatic Device;Vestibular;Visual/perceptual remediation/compensation;Spinal Manipulations    PT Next Visit Plan Continue to address fall risk with balance and dynamic gait training along with review/progression of balance portion of [place] Fall Prevention Program PRN; Gait training with rollator as indicated, Lumbopelvic strengthening and stabilization - HEP review/update PRN; Manual therapy & modalities for pain    PT Home Exercise Plan 10/27 - [place] Prevention Program; 12/13 - seated 3-way prayer stretch; 01/03/21 - [place] progression - B heel raises to include alt single LE eccentric lowering; 1/13 - addition of yellow TB or cuff weight to [place] strengthening exercises    Consulted and Agree with Plan of Care Patient           Patient will benefit from skilled therapeutic intervention in order to improve the following deficits and impairments:  Abnormal gait,Decreased activity tolerance,Decreased balance,Decreased coordination,Decreased endurance,Decreased knowledge of precautions,Decreased mobility,Decreased safety awareness,Decreased strength,Difficulty walking,Dizziness,Increased fascial  restricitons,Increased muscle spasms,Impaired perceived functional ability,Impaired flexibility,Improper body mechanics,Postural dysfunction,Pain  Visit Diagnosis: Repeated falls  Unsteadiness on feet  Muscle weakness (generalized)  Other abnormalities of gait and mobility  Dizziness and giddiness  Chronic bilateral low back pain without sciatica  Intractable acute post-traumatic headache  Other symptoms and signs involving the nervous system     Problem List Patient Active Problem List   Diagnosis Date Noted  . Low back pain 09/08/2020  . OSA and COPD overlap syndrome ([place]) 06/10/2020  . Serotonin withdrawal syndrome without complication 40/98/1191  . Recurrent falls 05/02/2020  . Statin intolerance 02/29/2020  . RLS (restless legs syndrome) 11/09/2019  . Kidney stone 02/26/2019  . Recurrent sinusitis 02/25/2019  . Hx of colonic polyp 02/25/2019  . DDD (degenerative disc disease), cervical 09/17/2018  . Degenerative scoliosis 04/22/2018  . Primary insomnia 06/25/2017  . Chronic interstitial cystitis 02/01/2017  . Cough 01/09/2017  . Right arm pain 07/20/2016  . Deviated septum 05/12/2016  . Nasal turbinate hypertrophy 05/12/2016  . Dysphagia   . Allergic rhinitis 01/25/2016  . Other and combined forms of senile cataract 07/15/2015  . Dyspnea   . Left hip pain 04/30/2015  . Sciatica 04/30/2015  .  Knee pain, right 04/30/2015  . Bilateral carpal tunnel syndrome 03/04/2015  . Hyperlipidemia 03/01/2015  . Pulmonary nodules 02/12/2015  . External hemorrhoids 02/05/2015  . Chronic night sweats 01/08/2015  . Atypical chest pain 01/01/2015  . Renal insufficiency 07/16/2014  . Memory loss 05/22/2014  . Peripheral neuropathy 05/22/2014  . Abdominal pain 10/26/2013  . Headache 10/13/2013  . Arthralgia 08/18/2013  . ANA positive 07/29/2013  . Depression 02/05/2013  . Family history of malignant hyperthermia 02/05/2013  . Malignant neoplasm of lower-outer quadrant  of right breast of female, estrogen receptor positive ([place]) 01/03/2013  . Splenic lesion 09/02/2012  . Asthma, allergic 08/05/2012  . GERD (gastroesophageal reflux disease) 06/03/2012  . Edema 04/08/2012  . Stricture and stenosis of esophagus 10/19/2011  . Functional constipation 07/21/2011  . Nonspecific (abnormal) findings on radiological and other examination of biliary tract 07/21/2011  . Osteoporosis 04/17/2011  . Leg pain 02/24/2011  . FIBROCYSTIC BREAST DISEASE, HX OF 10/18/2010  . Essential hypertension 08/25/2010  . CAD in native artery 08/25/2010  . TOBACCO ABUSE, HX OF 08/25/2010  . GENERALIZED ANXIETY DISORDER 08/03/2010  . Fibromyalgia 01/11/2010    Percival Spanish, PT, MPT 01/13/2021, 6:00 PM  [facility] [address]  [place], [place], [address] Phone: [phone]   Fax:  [phone]  Name: [name] MRN: [id number] Date of Birth: Oct 13, 1946

## 2021-01-17 ENCOUNTER — Ambulatory Visit: Payer: Medicare Other | Admitting: Physical Therapy

## 2021-01-17 ENCOUNTER — Encounter: Payer: Self-pay | Admitting: Physical Therapy

## 2021-01-17 ENCOUNTER — Other Ambulatory Visit: Payer: Self-pay

## 2021-01-17 DIAGNOSIS — G8929 Other chronic pain: Secondary | ICD-10-CM | POA: Diagnosis not present

## 2021-01-17 DIAGNOSIS — R29818 Other symptoms and signs involving the nervous system: Secondary | ICD-10-CM | POA: Diagnosis not present

## 2021-01-17 DIAGNOSIS — M6281 Muscle weakness (generalized): Secondary | ICD-10-CM

## 2021-01-17 DIAGNOSIS — R2681 Unsteadiness on feet: Secondary | ICD-10-CM | POA: Diagnosis not present

## 2021-01-17 DIAGNOSIS — M545 Low back pain, unspecified: Secondary | ICD-10-CM

## 2021-01-17 DIAGNOSIS — R2689 Other abnormalities of gait and mobility: Secondary | ICD-10-CM | POA: Diagnosis not present

## 2021-01-17 DIAGNOSIS — R296 Repeated falls: Secondary | ICD-10-CM | POA: Diagnosis not present

## 2021-01-17 DIAGNOSIS — R42 Dizziness and giddiness: Secondary | ICD-10-CM | POA: Diagnosis not present

## 2021-01-17 DIAGNOSIS — G44311 Acute post-traumatic headache, intractable: Secondary | ICD-10-CM | POA: Diagnosis not present

## 2021-01-17 NOTE — Therapy (Signed)
[facility] [address]  [place], [place], [address] Phone: [phone]   Fax:  [phone]  Physical Therapy Treatment  Patient Details  Name: [name] MRN: [id number] Date of Birth: June 27, 1946 Referring Provider (PT): [name], MD & Darryl Nestle, MD   Encounter Date: 01/17/2021   PT End of Session - 01/17/21 1019    Visit Number [id number]    Number of Visits 23    Date for PT Re-Evaluation 01/31/21    Authorization Type [employer]    PT Start Time 1019    PT Stop Time 1105    PT Time Calculation (min) 46 min    Activity Tolerance Patient tolerated treatment well    Behavior During Therapy [facility] for tasks assessed/performed           Past Medical History:  Diagnosis Date  . Allergy   . Anemia   . Anxiety    past hx   . Arthritis   . Asthma   . Atrial flutter ([place])    past history- not current  . CAD (coronary artery disease) CARDIOLOGIST--  DR Angelena Form   mild non-obstructive cad  . Cancer ([place])    right  . Cataract    bilaterally removed   . Chronic constipation   . Chronic kidney disease    interstitial cystitis  . Concussion    x 3  . COPD (chronic obstructive pulmonary disease) ([place])   . Depression    past hx   . Family history of malignant hyperthermia    father had this  . Fibromyalgia   . Frequency of urination   . GERD (gastroesophageal reflux disease)   . H/O hiatal hernia   . History of basal cell carcinoma excision    X2  . History of breast cancer ONCOLOGIST-- DR Jana Hakim---  NO RECURRANCE   DX 07/2012;  LOW GRADE DCIS  ER+PR+  ----  S/P RIGHT LUMPECTOMY WITH NEGATIVE MARGINS/   RADIATION ENDED 11/2012  . History of chronic bronchitis   . History of colonic polyps   . History of tachycardia    CONTROLLED  WITH ATENOLOL  . Hyperlipidemia   . Hypertension   . Neuromuscular disorder (HCC)    fibromyalgia  . Neuropathy   . Osteoporosis 01/2019   T score -2.2  stable/improved from prior study  . Pelvic pain   . Personal history of radiation therapy   . S/P radiation therapy 11/12/12 - 12/05/12   right Breast  . Sepsis ([place]) 2014   from UTI   . Sinus headache   . Urgency of urination     Past Surgical History:  Procedure Laterality Date  . [place] STUDY N/A 04/03/2016   Procedure: [place] STUDY;  Surgeon: Manus Gunning, MD;  Location: [facility];  Service: Gastroenterology;  Laterality: N/A;  . BREAST LUMPECTOMY Right 10-11-2012   W/ SLN BX  . CARDIAC CATHETERIZATION  09-13-2007  DR Lia Foyer   WELL-PRESERVED LVF/  DIFFUSE SCATTERED CORONARY CALCIFACATION AND ATHEROSCLEROSIS WITHOUT OBSTRUCTION  . CARDIAC CATHETERIZATION  08-04-2010  DR Angelena Form   NON-OBSTRUCTIVE CAD/  pLAD 40%/  oLAD 30%/  mLAD 30%/  pRCA 30%/  EF 60%  . CARDIOVASCULAR STRESS TEST  06-18-2012  DR McALHANY   LOW RISK NUCLEAR STUDY/  SMALL FIXED AREA OF MODERATELY DECREASED UPTAKE IN ANTEROSEPTAL WALL WHICH MAY BE ARTIFACTUAL/  NO ISCHEMIA/  EF 68%  . COLONOSCOPY  09-29-2010  .  CYSTOSCOPY    . CYSTOSCOPY WITH HYDRODISTENSION AND BIOPSY N/A 03/06/2014   Procedure: CYSTOSCOPY/HYDRODISTENSION/ INSTILATION OF MARCAINE AND PYRIDIUM;  Surgeon: Ailene Rud, MD;  Location: [facility];  Service: Urology;  Laterality: N/A;  . ESOPHAGEAL MANOMETRY N/A 04/03/2016   Procedure: ESOPHAGEAL MANOMETRY (EM);  Surgeon: Manus Gunning, MD;  Location: [facility];  Service: Gastroenterology;  Laterality: N/A;  . EXTRACORPOREAL SHOCK WAVE LITHOTRIPSY Left 02/06/2019   Procedure: EXTRACORPOREAL SHOCK WAVE LITHOTRIPSY (ESWL);  Surgeon: Kathie Rhodes, MD;  Location: [facility];  Service: Urology;  Laterality: Left;  . NASAL SINUS SURGERY  1985  . ORIF RIGHT ANKLE  FX  2006  . POLYPECTOMY    . REMOVAL VOCAL CORD CYST  FEB 2014  . RIGHT BREAST BX  08-23-2012  . RIGHT HAND SURGERY  X3  LAST ONE 2009   INCLUDES  ORIF RIGHT 5TH FINGER AND REVISION TWICE  .  SKIN CANCER EXCISION    . TONSILLECTOMY AND ADENOIDECTOMY  AGE 28  . TOTAL ABDOMINAL HYSTERECTOMY W/ BILATERAL SALPINGOOPHORECTOMY  1982   W/  APPENDECTOMY  . TRANSTHORACIC ECHOCARDIOGRAM  06-24-2012   GRADE I DIASTOLIC DYSFUNCTION/  EF 55-60%/  MILD MR  . UPPER GASTROINTESTINAL ENDOSCOPY      There were no vitals filed for this visit.   Subjective Assessment - 01/17/21 1023    Subjective Pt reports R hip/groin pain better today but still notes minor pain. Still awaiting orders for rollator (no return fax received from MD).    Pertinent History Recurrent falls - 3 in past 6 months, 7 major falls in past 5-6 yrs (4 concussions, finger fracture); costochondritis, cellulitis of R breast, fibromyalgia, chronic LBP since ~age 27, DDD, sciatica, osteoporosis, degenerative scoliosis, peripheral neuropathy, vertigo, R knee pain, CAD, COPD, HTN, h/o lumpectomy for R breast cancer 2013, ORIF R ankle s/p fall 2006    Patient Stated Goals "Be able to gain some strength in my legs to help with my back. To be able stand w/o feeling like I'm swaying and walk w/o tripping."    Currently in Pain? Yes    Pain Score 4     Pain Location Hip    Pain Orientation Right    Pain Descriptors / Indicators Dull    Pain Type Chronic pain    Pain Frequency Intermittent                             [facility] Adult PT Treatment/Exercise - 01/17/21 1019      Transfers   Transfers Floor to Transfer    Floor to Transfer 5: Supervision;With upper extremity assist    Floor to Transfer Details (indicate cue type and reason) via 1/2 kneel simulating return to standing after collecting clothes from dryer      Self-Care   Self-Care Posture    Posture Provided education on proper posture and body mechaincs with typical daily activities and tasks to reduce strain on lumbar spine and provide improved stability.      Therapeutic Activites    Therapeutic Activities ADL's    ADL's Simulated typical household  tasks such as laundry - loading and emptying washer (bean bags from wooden box to counter height using RDL-style lift) and dryer (1/2 kneel at edge of mat table) using proper posture and body mechanics techniques      Knee/Hip Exercises: Aerobic   Nustep L5 x 6 min (UE/LE)      Knee/Hip Exercises:  Standing   SLS SLS + B UE RDL reach to mat table and counter heights x 10 each                    PT Short Term Goals - 06/22/20 1015      PT SHORT TERM GOAL #1   Title Patient will be independent with initial HEP    Status Achieved   06/22/20     PT SHORT TERM GOAL #2   Title Complete FGA assessment    Status Achieved   06/22/20            PT Long Term Goals - 01/03/21 1100      PT LONG TERM GOAL #1   Title Patient will be independent with ongoing/advanced HEP    Status Partially Met    Target Date 01/31/21      PT LONG TERM GOAL #2   Title Patient will demonstrate improved B LE strength to >/= 4/5 to 4+/5 for improved stability and ease of mobility    Status Partially Met    Target Date 01/31/21      PT LONG TERM GOAL #3   Title Patient will improve Berg score to >/= 52/56 to improve safety stability with ADLs in standing and reduce risk for falls    Baseline perviously met on 09/16/20 - Berg = 52/56, but pt has regressed to 46/56 as of 12/09/20    Status On-going    Target Date 01/31/21      PT LONG TERM GOAL #4   Title Patient will report no further instances of falls    Status Partially Met   10/14/20 - no falls since 09/02/20, but continued LOB   Target Date 01/31/21      PT LONG TERM GOAL #5   Title Patient will improve FGA score to >/= 24/30 to improve gait stability and reduce risk for falls    Baseline perviously met on 10/11/20 - FGA = 21/30, but pt has regressed to 15/30 as of 12/09/20    Status On-going    Target Date 01/31/21      PT LONG TERM GOAL #6   Title Patient to report no dizziness remaining with bed mobility.    Status Achieved   met as of  08/24/20; Will reassess as indicated     PT LONG TERM GOAL #7   Title Patient will demonstrate safe gait pattern >500 ft with rollator on all surfaces to reduce risk for falls    Status Achieved   01/03/21                Plan - 01/17/21 1105    Clinical Impression Statement [name] notes R hip/groin pain much better than last week and feels that stretches and self-STM are taking care of the problem - she denies need for PT to further address this today. Her balance issues and pain continue to impact her ability to perform her typical daily ADLs and household chores, therefore session focusing on postural awareness and good body mechanics along with safe positioning to complete these tasks - posture and body mechanics handout reviewed with therapeutic exercises and activities simulating household chores. Pt able to demonstrate good stability and transitional movement with  kneel simulation of dryer task, but increased tendency for LOB noted with RDL style reach for simulation of retrieving clothes from washer.    Comorbidities Recurrent falls - at least 5 falls in past 6 months, 8-9 since start of year &  7 major falls in past 5-6 yrs (4 concussions, finger fracture); costochondritis, cellulitis of R breast, fibromyalgia, chronic LBP since ~age 40, DDD, sciatica, osteoporosis, degenerative scoliosis, peripheral neuropathy, vertigo, R knee pain, CAD, COPD, HTN, h/o lumpectomy for R breast cancer 2013, ORIF R ankle s/p fall 2006    Stability/Clinical Decision Making Evolving/Moderate complexity    Rehab Potential Good    PT Frequency 2x / week    PT Duration 8 weeks    PT Treatment/Interventions ADLs/Self Care Home Management;Cryotherapy;Electrical Stimulation;Iontophoresis 4mg /ml Dexamethasone;Moist Heat;Ultrasound;DME Instruction;Gait training;Stair training;Functional mobility training;Therapeutic activities;Therapeutic exercise;Balance training;Neuromuscular re-education;Patient/family  education;Manual techniques;Passive range of motion;Dry needling;Taping;Vasopneumatic Device;Vestibular;Visual/perceptual remediation/compensation;Spinal Manipulations    PT Next Visit Plan Continue to address fall risk with balance and dynamic gait training along with review/progression of balance portion of [place] Fall Prevention Program PRN; Gait training with rollator as indicated, Lumbopelvic strengthening and stabilization - HEP review/update PRN; Manual therapy & modalities for pain    PT Home Exercise Plan 10/27 - [place] Prevention Program; 12/13 - seated 3-way prayer stretch; 01/03/21 - [place] progression - B heel raises to include alt single LE eccentric lowering; 1/13 - addition of yellow TB or cuff weight to [place] strengthening exercises    Consulted and Agree with Plan of Care Patient           Patient will benefit from skilled therapeutic intervention in order to improve the following deficits and impairments:  Abnormal gait,Decreased activity tolerance,Decreased balance,Decreased coordination,Decreased endurance,Decreased knowledge of precautions,Decreased mobility,Decreased safety awareness,Decreased strength,Difficulty walking,Dizziness,Increased fascial restricitons,Increased muscle spasms,Impaired perceived functional ability,Impaired flexibility,Improper body mechanics,Postural dysfunction,Pain  Visit Diagnosis: Repeated falls  Unsteadiness on feet  Muscle weakness (generalized)  Other abnormalities of gait and mobility  Dizziness and giddiness  Chronic bilateral low back pain without sciatica  Intractable acute post-traumatic headache  Other symptoms and signs involving the nervous system     Problem List Patient Active Problem List   Diagnosis Date Noted  . Low back pain 09/08/2020  . OSA and COPD overlap syndrome ([place]) 06/10/2020  . Serotonin withdrawal syndrome without complication 25/85/2778  . Recurrent falls 05/02/2020  . Statin intolerance 02/29/2020   . RLS (restless legs syndrome) 11/09/2019  . Kidney stone 02/26/2019  . Recurrent sinusitis 02/25/2019  . Hx of colonic polyp 02/25/2019  . DDD (degenerative disc disease), cervical 09/17/2018  . Degenerative scoliosis 04/22/2018  . Primary insomnia 06/25/2017  . Chronic interstitial cystitis 02/01/2017  . Cough 01/09/2017  . Right arm pain 07/20/2016  . Deviated septum 05/12/2016  . Nasal turbinate hypertrophy 05/12/2016  . Dysphagia   . Allergic rhinitis 01/25/2016  . Other and combined forms of senile cataract 07/15/2015  . Dyspnea   . Left hip pain 04/30/2015  . Sciatica 04/30/2015  . Knee pain, right 04/30/2015  . Bilateral carpal tunnel syndrome 03/04/2015  . Hyperlipidemia 03/01/2015  . Pulmonary nodules 02/12/2015  . External hemorrhoids 02/05/2015  . Chronic night sweats 01/08/2015  . Atypical chest pain 01/01/2015  . Renal insufficiency 07/16/2014  . Memory loss 05/22/2014  . Peripheral neuropathy 05/22/2014  . Abdominal pain 10/26/2013  . Headache 10/13/2013  . Arthralgia 08/18/2013  . ANA positive 07/29/2013  . Depression 02/05/2013  . Family history of malignant hyperthermia 02/05/2013  . Malignant neoplasm of lower-outer quadrant of right breast of female, estrogen receptor positive ([place]) 01/03/2013  . Splenic lesion 09/02/2012  . Asthma, allergic 08/05/2012  . GERD (gastroesophageal reflux disease) 06/03/2012  . Edema 04/08/2012  . Stricture and stenosis of esophagus 10/19/2011  .  Functional constipation 07/21/2011  . Nonspecific (abnormal) findings on radiological and other examination of biliary tract 07/21/2011  . Osteoporosis 04/17/2011  . Leg pain 02/24/2011  . FIBROCYSTIC BREAST DISEASE, HX OF 10/18/2010  . Essential hypertension 08/25/2010  . CAD in native artery 08/25/2010  . TOBACCO ABUSE, HX OF 08/25/2010  . GENERALIZED ANXIETY DISORDER 08/03/2010  . Fibromyalgia 01/11/2010    Percival Spanish, PT, MPT 01/17/2021, 12:17 PM  [facility] [address]  [place], [place], [address] Phone: [phone]   Fax:  [phone]  Name: [name] MRN: [id number] Date of Birth: Oct 18, 1946

## 2021-01-17 NOTE — Patient Instructions (Signed)

## 2021-01-18 ENCOUNTER — Telehealth: Payer: Self-pay | Admitting: Family Medicine

## 2021-01-18 NOTE — Telephone Encounter (Signed)
Patient called back

## 2021-01-20 ENCOUNTER — Encounter: Payer: Self-pay | Admitting: Physical Therapy

## 2021-01-20 ENCOUNTER — Other Ambulatory Visit: Payer: Self-pay

## 2021-01-20 ENCOUNTER — Ambulatory Visit: Payer: Medicare Other | Admitting: Physical Therapy

## 2021-01-20 DIAGNOSIS — R2681 Unsteadiness on feet: Secondary | ICD-10-CM

## 2021-01-20 DIAGNOSIS — R29818 Other symptoms and signs involving the nervous system: Secondary | ICD-10-CM | POA: Diagnosis not present

## 2021-01-20 DIAGNOSIS — G8929 Other chronic pain: Secondary | ICD-10-CM | POA: Diagnosis not present

## 2021-01-20 DIAGNOSIS — R296 Repeated falls: Secondary | ICD-10-CM | POA: Diagnosis not present

## 2021-01-20 DIAGNOSIS — M545 Low back pain, unspecified: Secondary | ICD-10-CM | POA: Diagnosis not present

## 2021-01-20 DIAGNOSIS — M6281 Muscle weakness (generalized): Secondary | ICD-10-CM | POA: Diagnosis not present

## 2021-01-20 DIAGNOSIS — R2689 Other abnormalities of gait and mobility: Secondary | ICD-10-CM

## 2021-01-20 DIAGNOSIS — G44311 Acute post-traumatic headache, intractable: Secondary | ICD-10-CM

## 2021-01-20 DIAGNOSIS — R42 Dizziness and giddiness: Secondary | ICD-10-CM | POA: Diagnosis not present

## 2021-01-20 NOTE — Therapy (Signed)
[facility] [address]  [place], [place], [address] Phone: [phone]   Fax:  [phone]  Physical Therapy Treatment  Patient Details  Name: [name] MRN: [id number] Date of Birth: 07-Oct-1946 Referring Provider (PT): [name], MD & Darryl Nestle, MD   Encounter Date: 01/20/2021   PT End of Session - 01/20/21 1018    Visit Number [id number]    Number of Visits 46    Date for PT Re-Evaluation 01/31/21    Authorization Type [employer]    PT Start Time 1018    PT Stop Time 1104    PT Time Calculation (min) 46 min    Activity Tolerance Patient tolerated treatment well    Behavior During Therapy [facility] for tasks assessed/performed           Past Medical History:  Diagnosis Date  . Allergy   . Anemia   . Anxiety    past hx   . Arthritis   . Asthma   . Atrial flutter ([place])    past history- not current  . CAD (coronary artery disease) CARDIOLOGIST--  DR Angelena Form   mild non-obstructive cad  . Cancer ([place])    right  . Cataract    bilaterally removed   . Chronic constipation   . Chronic kidney disease    interstitial cystitis  . Concussion    x 3  . COPD (chronic obstructive pulmonary disease) ([place])   . Depression    past hx   . Family history of malignant hyperthermia    father had this  . Fibromyalgia   . Frequency of urination   . GERD (gastroesophageal reflux disease)   . H/O hiatal hernia   . History of basal cell carcinoma excision    X2  . History of breast cancer ONCOLOGIST-- DR Jana Hakim---  NO RECURRANCE   DX 07/2012;  LOW GRADE DCIS  ER+PR+  ----  S/P RIGHT LUMPECTOMY WITH NEGATIVE MARGINS/   RADIATION ENDED 11/2012  . History of chronic bronchitis   . History of colonic polyps   . History of tachycardia    CONTROLLED  WITH ATENOLOL  . Hyperlipidemia   . Hypertension   . Neuromuscular disorder (HCC)    fibromyalgia  . Neuropathy   . Osteoporosis 01/2019   T score -2.2  stable/improved from prior study  . Pelvic pain   . Personal history of radiation therapy   . S/P radiation therapy 11/12/12 - 12/05/12   right Breast  . Sepsis ([place]) 2014   from UTI   . Sinus headache   . Urgency of urination     Past Surgical History:  Procedure Laterality Date  . [place] STUDY N/A 04/03/2016   Procedure: [place] STUDY;  Surgeon: Manus Gunning, MD;  Location: [facility];  Service: Gastroenterology;  Laterality: N/A;  . BREAST LUMPECTOMY Right 10-11-2012   W/ SLN BX  . CARDIAC CATHETERIZATION  09-13-2007  DR Lia Foyer   WELL-PRESERVED LVF/  DIFFUSE SCATTERED CORONARY CALCIFACATION AND ATHEROSCLEROSIS WITHOUT OBSTRUCTION  . CARDIAC CATHETERIZATION  08-04-2010  DR Angelena Form   NON-OBSTRUCTIVE CAD/  pLAD 40%/  oLAD 30%/  mLAD 30%/  pRCA 30%/  EF 60%  . CARDIOVASCULAR STRESS TEST  06-18-2012  DR McALHANY   LOW RISK NUCLEAR STUDY/  SMALL FIXED AREA OF MODERATELY DECREASED UPTAKE IN ANTEROSEPTAL WALL WHICH MAY BE ARTIFACTUAL/  NO ISCHEMIA/  EF 68%  . COLONOSCOPY  09-29-2010  .  CYSTOSCOPY    . CYSTOSCOPY WITH HYDRODISTENSION AND BIOPSY N/A 03/06/2014   Procedure: CYSTOSCOPY/HYDRODISTENSION/ INSTILATION OF MARCAINE AND PYRIDIUM;  Surgeon: Ailene Rud, MD;  Location: [facility];  Service: Urology;  Laterality: N/A;  . ESOPHAGEAL MANOMETRY N/A 04/03/2016   Procedure: ESOPHAGEAL MANOMETRY (EM);  Surgeon: Manus Gunning, MD;  Location: [facility];  Service: Gastroenterology;  Laterality: N/A;  . EXTRACORPOREAL SHOCK WAVE LITHOTRIPSY Left 02/06/2019   Procedure: EXTRACORPOREAL SHOCK WAVE LITHOTRIPSY (ESWL);  Surgeon: Kathie Rhodes, MD;  Location: [facility];  Service: Urology;  Laterality: Left;  . NASAL SINUS SURGERY  1985  . ORIF RIGHT ANKLE  FX  2006  . POLYPECTOMY    . REMOVAL VOCAL CORD CYST  FEB 2014  . RIGHT BREAST BX  08-23-2012  . RIGHT HAND SURGERY  X3  LAST ONE 2009   INCLUDES  ORIF RIGHT 5TH FINGER AND REVISION TWICE  .  SKIN CANCER EXCISION    . TONSILLECTOMY AND ADENOIDECTOMY  AGE 7  . TOTAL ABDOMINAL HYSTERECTOMY W/ BILATERAL SALPINGOOPHORECTOMY  1982   W/  APPENDECTOMY  . TRANSTHORACIC ECHOCARDIOGRAM  06-24-2012   GRADE I DIASTOLIC DYSFUNCTION/  EF 55-60%/  MILD MR  . UPPER GASTROINTESTINAL ENDOSCOPY      There were no vitals filed for this visit.   Subjective Assessment - 01/20/21 1023    Subjective Pt reports not feeling well today - feels weak all over and legs feel heavy.    Pertinent History Recurrent falls - 3 in past 6 months, 7 major falls in past 5-6 yrs (4 concussions, finger fracture); costochondritis, cellulitis of R breast, fibromyalgia, chronic LBP since ~age 36, DDD, sciatica, osteoporosis, degenerative scoliosis, peripheral neuropathy, vertigo, R knee pain, CAD, COPD, HTN, h/o lumpectomy for R breast cancer 2013, ORIF R ankle s/p fall 2006    Patient Stated Goals "Be able to gain some strength in my legs to help with my back. To be able stand w/o feeling like I'm swaying and walk w/o tripping."    Currently in Pain? Yes   generalized "all over"                            [facility] Adult PT Treatment/Exercise - 01/20/21 1018      Neuro Re-ed    Neuro Re-ed Details  Alt fwd step/lunge with contralateral reach to cone atop 36" FR 2 x 10 reps, adding Airex pad under fwd foot for 2nd; Standing on Airex pad with reaching outside of BOS for bean bags, then tossing bean bags to knock down cones   CGA/SBA of PT for all activities     Knee/Hip Exercises: Aerobic   Nustep L5 x 6 min (UE/LE)               Balance Exercises - 01/20/21 1018      Balance Exercises: Standing   Balance Beam Fwd and B side-stepping on 6' foam balance beam x 6 reps each - CGA of PT    Tandem Gait Forward;Foam/compliant surface   CGA of PT              PT Short Term Goals - 06/22/20 1015      PT SHORT TERM GOAL #1   Title Patient will be independent with initial HEP    Status Achieved    06/22/20     PT SHORT TERM GOAL #2   Title Complete FGA assessment    Status Achieved  06/22/20            PT Long Term Goals - 01/03/21 1100      PT LONG TERM GOAL #1   Title Patient will be independent with ongoing/advanced HEP    Status Partially Met    Target Date 01/31/21      PT LONG TERM GOAL #2   Title Patient will demonstrate improved B LE strength to >/= 4/5 to 4+/5 for improved stability and ease of mobility    Status Partially Met    Target Date 01/31/21      PT LONG TERM GOAL #3   Title Patient will improve Berg score to >/= 52/56 to improve safety stability with ADLs in standing and reduce risk for falls    Baseline perviously met on 09/16/20 - Berg = 52/56, but pt has regressed to 46/56 as of 12/09/20    Status On-going    Target Date 01/31/21      PT LONG TERM GOAL #4   Title Patient will report no further instances of falls    Status Partially Met   10/14/20 - no falls since 09/02/20, but continued LOB   Target Date 01/31/21      PT LONG TERM GOAL #5   Title Patient will improve FGA score to >/= 24/30 to improve gait stability and reduce risk for falls    Baseline perviously met on 10/11/20 - FGA = 21/30, but pt has regressed to 15/30 as of 12/09/20    Status On-going    Target Date 01/31/21      PT LONG TERM GOAL #6   Title Patient to report no dizziness remaining with bed mobility.    Status Achieved   met as of 08/24/20; Will reassess as indicated     PT LONG TERM GOAL #7   Title Patient will demonstrate safe gait pattern >500 ft with rollator on all surfaces to reduce risk for falls    Status Achieved   01/03/21                Plan - 01/20/21 1026    Clinical Impression Statement Bonita Quin notes she has tried some of the posture and body mechanics techniques, especially the laundry related tasks, and feels that they really do help her back. Pt noting some recent increased "spinning" dizziness upon sit to stand which is associated with "rushing"  feeling in the side of her neck (no symptoms with supine <> sit but has been careful with movement patterns during supine <> sit transitions) - she states her carotid arteries have been checked w/o issues, but she has not seen the ENT as was recommended. Encouraged pt to f/u with ENT as well as keep working on her previously provided vestibular exercises - she denies need for review of these exercises at this time. Majority of session focusing on statis and dynamic balance and stepping activities including incorporating unstable surfaces to facilitate improved balance reactions - PT providing CGA/SBA t/o activities.    Comorbidities Recurrent falls - at least 5 falls in past 6 months, 8-9 since start of year & 7 major falls in past 5-6 yrs (4 concussions, finger fracture); costochondritis, cellulitis of R breast, fibromyalgia, chronic LBP since ~age 29, DDD, sciatica, osteoporosis, degenerative scoliosis, peripheral neuropathy, vertigo, R knee pain, CAD, COPD, HTN, h/o lumpectomy for R breast cancer 2013, ORIF R ankle s/p fall 2006    Stability/Clinical Decision Making Evolving/Moderate complexity    Rehab Potential Good    PT  Frequency 2x / week    PT Duration 8 weeks    PT Treatment/Interventions ADLs/Self Care Home Management;Cryotherapy;Electrical Stimulation;Iontophoresis 4mg /ml Dexamethasone;Moist Heat;Ultrasound;DME Instruction;Gait training;Stair training;Functional mobility training;Therapeutic activities;Therapeutic exercise;Balance training;Neuromuscular re-education;Patient/family education;Manual techniques;Passive range of motion;Dry needling;Taping;Vasopneumatic Device;Vestibular;Visual/perceptual remediation/compensation;Spinal Manipulations    PT Next Visit Plan Continue to address fall risk with balance and dynamic gait training along with review/progression of balance portion of [place] Fall Prevention Program PRN; Gait training with rollator as indicated, Lumbopelvic strengthening and  stabilization - HEP review/update PRN; Manual therapy & modalities for pain    PT Home Exercise Plan 10/27 - [place] Prevention Program; 12/13 - seated 3-way prayer stretch; 01/03/21 - [place] progression - B heel raises to include alt single LE eccentric lowering; 1/13 - addition of yellow TB or cuff weight to [place] strengthening exercises    Consulted and Agree with Plan of Care Patient           Patient will benefit from skilled therapeutic intervention in order to improve the following deficits and impairments:  Abnormal gait,Decreased activity tolerance,Decreased balance,Decreased coordination,Decreased endurance,Decreased knowledge of precautions,Decreased mobility,Decreased safety awareness,Decreased strength,Difficulty walking,Dizziness,Increased fascial restricitons,Increased muscle spasms,Impaired perceived functional ability,Impaired flexibility,Improper body mechanics,Postural dysfunction,Pain  Visit Diagnosis: Repeated falls  Unsteadiness on feet  Muscle weakness (generalized)  Other abnormalities of gait and mobility  Dizziness and giddiness  Chronic bilateral low back pain without sciatica  Intractable acute post-traumatic headache  Other symptoms and signs involving the nervous system     Problem List Patient Active Problem List   Diagnosis Date Noted  . Low back pain 09/08/2020  . OSA and COPD overlap syndrome ([place]) 06/10/2020  . Serotonin withdrawal syndrome without complication 53/97/6734  . Recurrent falls 05/02/2020  . Statin intolerance 02/29/2020  . RLS (restless legs syndrome) 11/09/2019  . Kidney stone 02/26/2019  . Recurrent sinusitis 02/25/2019  . Hx of colonic polyp 02/25/2019  . DDD (degenerative disc disease), cervical 09/17/2018  . Degenerative scoliosis 04/22/2018  . Primary insomnia 06/25/2017  . Chronic interstitial cystitis 02/01/2017  . Cough 01/09/2017  . Right arm pain 07/20/2016  . Deviated septum 05/12/2016  . Nasal turbinate  hypertrophy 05/12/2016  . Dysphagia   . Allergic rhinitis 01/25/2016  . Other and combined forms of senile cataract 07/15/2015  . Dyspnea   . Left hip pain 04/30/2015  . Sciatica 04/30/2015  . Knee pain, right 04/30/2015  . Bilateral carpal tunnel syndrome 03/04/2015  . Hyperlipidemia 03/01/2015  . Pulmonary nodules 02/12/2015  . External hemorrhoids 02/05/2015  . Chronic night sweats 01/08/2015  . Atypical chest pain 01/01/2015  . Renal insufficiency 07/16/2014  . Memory loss 05/22/2014  . Peripheral neuropathy 05/22/2014  . Abdominal pain 10/26/2013  . Headache 10/13/2013  . Arthralgia 08/18/2013  . ANA positive 07/29/2013  . Depression 02/05/2013  . Family history of malignant hyperthermia 02/05/2013  . Malignant neoplasm of lower-outer quadrant of right breast of female, estrogen receptor positive ([place]) 01/03/2013  . Splenic lesion 09/02/2012  . Asthma, allergic 08/05/2012  . GERD (gastroesophageal reflux disease) 06/03/2012  . Edema 04/08/2012  . Stricture and stenosis of esophagus 10/19/2011  . Functional constipation 07/21/2011  . Nonspecific (abnormal) findings on radiological and other examination of biliary tract 07/21/2011  . Osteoporosis 04/17/2011  . Leg pain 02/24/2011  . FIBROCYSTIC BREAST DISEASE, HX OF 10/18/2010  . Essential hypertension 08/25/2010  . CAD in native artery 08/25/2010  . TOBACCO ABUSE, HX OF 08/25/2010  . GENERALIZED ANXIETY DISORDER 08/03/2010  . Fibromyalgia 01/11/2010    Adriana Lina  Nolon Rod, PT, MPT 01/20/2021, 12:37 PM  [facility] [address]  [place], [place], [address] Phone: [phone]   Fax:  [phone]  Name: [name] MRN: [id number] Date of Birth: [birth year]

## 2021-01-25 ENCOUNTER — Ambulatory Visit: Payer: Medicare Other | Attending: Family Medicine | Admitting: Physical Therapy

## 2021-01-25 ENCOUNTER — Encounter: Payer: Self-pay | Admitting: Physical Therapy

## 2021-01-25 ENCOUNTER — Other Ambulatory Visit: Payer: Self-pay

## 2021-01-25 DIAGNOSIS — R2689 Other abnormalities of gait and mobility: Secondary | ICD-10-CM | POA: Diagnosis not present

## 2021-01-25 DIAGNOSIS — G44311 Acute post-traumatic headache, intractable: Secondary | ICD-10-CM | POA: Insufficient documentation

## 2021-01-25 DIAGNOSIS — R42 Dizziness and giddiness: Secondary | ICD-10-CM | POA: Diagnosis not present

## 2021-01-25 DIAGNOSIS — M6281 Muscle weakness (generalized): Secondary | ICD-10-CM | POA: Diagnosis not present

## 2021-01-25 DIAGNOSIS — R296 Repeated falls: Secondary | ICD-10-CM | POA: Insufficient documentation

## 2021-01-25 DIAGNOSIS — M545 Low back pain, unspecified: Secondary | ICD-10-CM | POA: Insufficient documentation

## 2021-01-25 DIAGNOSIS — R29818 Other symptoms and signs involving the nervous system: Secondary | ICD-10-CM | POA: Diagnosis not present

## 2021-01-25 DIAGNOSIS — G8929 Other chronic pain: Secondary | ICD-10-CM

## 2021-01-25 DIAGNOSIS — R2681 Unsteadiness on feet: Secondary | ICD-10-CM

## 2021-01-25 NOTE — Therapy (Signed)
[facility] [address]  [place], [place], [address] Phone: [phone]   Fax:  [phone]  Physical Therapy Treatment  Patient Details  Name: [name] MRN: [id number] Date of Birth: 11-24-1946 Referring Provider (PT): [name], MD & Darryl Nestle, MD   Encounter Date: 01/25/2021   PT End of Session - 01/25/21 1018    Visit Number [id number]    Number of Visits 46    Date for PT Re-Evaluation 01/31/21    Authorization Type [employer]    PT Start Time 1018    PT Stop Time 1106    PT Time Calculation (min) 48 min    Activity Tolerance Patient tolerated treatment well    Behavior During Therapy [name] for tasks assessed/performed           Past Medical History:  Diagnosis Date  . Allergy   . Anemia   . Anxiety    past hx   . Arthritis   . Asthma   . Atrial flutter ([place])    past history- not current  . CAD (coronary artery disease) CARDIOLOGIST--  DR Angelena Form   mild non-obstructive cad  . Cancer ([place])    right  . Cataract    bilaterally removed   . Chronic constipation   . Chronic kidney disease    interstitial cystitis  . Concussion    x 3  . COPD (chronic obstructive pulmonary disease) ([place])   . Depression    past hx   . Family history of malignant hyperthermia    father had this  . Fibromyalgia   . Frequency of urination   . GERD (gastroesophageal reflux disease)   . H/O hiatal hernia   . History of basal cell carcinoma excision    X2  . History of breast cancer ONCOLOGIST-- DR Jana Hakim---  NO RECURRANCE   DX 07/2012;  LOW GRADE DCIS  ER+PR+  ----  S/P RIGHT LUMPECTOMY WITH NEGATIVE MARGINS/   RADIATION ENDED 11/2012  . History of chronic bronchitis   . History of colonic polyps   . History of tachycardia    CONTROLLED  WITH ATENOLOL  . Hyperlipidemia   . Hypertension   . Neuromuscular disorder (HCC)    fibromyalgia  . Neuropathy   . Osteoporosis 01/2019   T score -2.2  stable/improved from prior study  . Pelvic pain   . Personal history of radiation therapy   . S/P radiation therapy 11/12/12 - 12/05/12   right Breast  . Sepsis ([place]) 2014   from UTI   . Sinus headache   . Urgency of urination     Past Surgical History:  Procedure Laterality Date  . [place] STUDY N/A 04/03/2016   Procedure: [place] STUDY;  Surgeon: Manus Gunning, MD;  Location: [facility];  Service: Gastroenterology;  Laterality: N/A;  . BREAST LUMPECTOMY Right 10-11-2012   W/ SLN BX  . CARDIAC CATHETERIZATION  09-13-2007  DR Lia Foyer   WELL-PRESERVED LVF/  DIFFUSE SCATTERED CORONARY CALCIFACATION AND ATHEROSCLEROSIS WITHOUT OBSTRUCTION  . CARDIAC CATHETERIZATION  08-04-2010  DR Angelena Form   NON-OBSTRUCTIVE CAD/  pLAD 40%/  oLAD 30%/  mLAD 30%/  pRCA 30%/  EF 60%  . CARDIOVASCULAR STRESS TEST  06-18-2012  DR McALHANY   LOW RISK NUCLEAR STUDY/  SMALL FIXED AREA OF MODERATELY DECREASED UPTAKE IN ANTEROSEPTAL WALL WHICH MAY BE ARTIFACTUAL/  NO ISCHEMIA/  EF 68%  . COLONOSCOPY  09-29-2010  .  CYSTOSCOPY    . CYSTOSCOPY WITH HYDRODISTENSION AND BIOPSY N/A 03/06/2014   Procedure: CYSTOSCOPY/HYDRODISTENSION/ INSTILATION OF MARCAINE AND PYRIDIUM;  Surgeon: Ailene Rud, MD;  Location: [facility];  Service: Urology;  Laterality: N/A;  . ESOPHAGEAL MANOMETRY N/A 04/03/2016   Procedure: ESOPHAGEAL MANOMETRY (EM);  Surgeon: Manus Gunning, MD;  Location: [facility];  Service: Gastroenterology;  Laterality: N/A;  . EXTRACORPOREAL SHOCK WAVE LITHOTRIPSY Left 02/06/2019   Procedure: EXTRACORPOREAL SHOCK WAVE LITHOTRIPSY (ESWL);  Surgeon: Kathie Rhodes, MD;  Location: [facility];  Service: Urology;  Laterality: Left;  . NASAL SINUS SURGERY  1985  . ORIF RIGHT ANKLE  FX  2006  . POLYPECTOMY    . REMOVAL VOCAL CORD CYST  FEB 2014  . RIGHT BREAST BX  08-23-2012  . RIGHT HAND SURGERY  X3  LAST ONE 2009   INCLUDES  ORIF RIGHT 5TH FINGER AND REVISION TWICE  .  SKIN CANCER EXCISION    . TONSILLECTOMY AND ADENOIDECTOMY  AGE 89  . TOTAL ABDOMINAL HYSTERECTOMY W/ BILATERAL SALPINGOOPHORECTOMY  1982   W/  APPENDECTOMY  . TRANSTHORACIC ECHOCARDIOGRAM  06-24-2012   GRADE I DIASTOLIC DYSFUNCTION/  EF 55-60%/  MILD MR  . UPPER GASTROINTESTINAL ENDOSCOPY      There were no vitals filed for this visit.   Subjective Assessment - 01/25/21 1022    Subjective Pt noting more pain in R foot and ankle in the mornings - not sure if it the cold or the beginnings of OA from her h/o prior fracture with ORIF.    Pertinent History Recurrent falls - 3 in past 6 months, 7 major falls in past 5-6 yrs (4 concussions, finger fracture); costochondritis, cellulitis of R breast, fibromyalgia, chronic LBP since ~age 41, DDD, sciatica, osteoporosis, degenerative scoliosis, peripheral neuropathy, vertigo, R knee pain, CAD, COPD, HTN, h/o lumpectomy for R breast cancer 2013, ORIF R ankle s/p fall 2006    Patient Stated Goals "Be able to gain some strength in my legs to help with my back. To be able stand w/o feeling like I'm swaying and walk w/o tripping."    Currently in Pain? Yes    Pain Score 0-No pain    Pain Location Hip    Pain Score 6    Pain Location Ankle    Pain Orientation Right    Pain Descriptors / Indicators Aching    Pain Type Chronic pain    Pain Frequency Intermittent                             [facility] Adult PT Treatment/Exercise - 01/25/21 1018      Lumbar Exercises: Standing   Row Both;10 reps;Strengthening;Theraband    Theraband Level (Row) Level 2 (Red)    Row Limitations cues for abdominal bracing and scap retraction, narrow BOS to promote increased balance reactions    Shoulder Extension Both;5 reps;Strengthening;Theraband    Theraband Level (Shoulder Extension) Level 2 (Red)    Shoulder Extension Limitations cues for abdominal bracing and scap retraction - discontinued d/t increased L upper shoulder pain      Knee/Hip Exercises:  Stretches   Gastroc Stretch Right;Left;2 reps;30 seconds    Gastroc Stretch Limitations runner's stretch at [employer] rail    Soleus Stretch Right;Left;2 reps;30 seconds    Soleus Stretch Limitations runner's stretch at [employer] rail      Knee/Hip Exercises: Aerobic   Nustep L5 x 6 min (UE/LE)  Knee/Hip Exercises: Standing   Heel Raises Both;20 reps;3 seconds    Heel Raises Limitations ball btw ankles to reduce ankle/foot supination    Other Standing Knee Exercises B toe raises 20 x 3"                  PT Education - 01/25/21 1106    Education Details Review of gastroc/soleus stretches + additon of ball btw ankles for heel raises to reduce ankle/foot supination    Person(s) Educated Patient    Methods Explanation;Demonstration;Verbal cues;Handout    Comprehension Verbalized understanding;Verbal cues required;Returned demonstration            PT Short Term Goals - 06/22/20 1015      PT SHORT TERM GOAL #1   Title Patient will be independent with initial HEP    Status Achieved   06/22/20     PT SHORT TERM GOAL #2   Title Complete FGA assessment    Status Achieved   06/22/20            PT Long Term Goals - 01/03/21 1100      PT LONG TERM GOAL #1   Title Patient will be independent with ongoing/advanced HEP    Status Partially Met    Target Date 01/31/21      PT LONG TERM GOAL #2   Title Patient will demonstrate improved B LE strength to >/= 4/5 to 4+/5 for improved stability and ease of mobility    Status Partially Met    Target Date 01/31/21      PT LONG TERM GOAL #3   Title Patient will improve Berg score to >/= 52/56 to improve safety stability with ADLs in standing and reduce risk for falls    Baseline perviously met on 09/16/20 - Berg = 52/56, but pt has regressed to 46/56 as of 12/09/20    Status On-going    Target Date 01/31/21      PT LONG TERM GOAL #4   Title Patient will report no further instances of falls    Status Partially Met   10/14/20 - no falls  since 09/02/20, but continued LOB   Target Date 01/31/21      PT LONG TERM GOAL #5   Title Patient will improve FGA score to >/= 24/30 to improve gait stability and reduce risk for falls    Baseline perviously met on 10/11/20 - FGA = 21/30, but pt has regressed to 15/30 as of 12/09/20    Status On-going    Target Date 01/31/21      PT LONG TERM GOAL #6   Title Patient to report no dizziness remaining with bed mobility.    Status Achieved   met as of 08/24/20; Will reassess as indicated     PT LONG TERM GOAL #7   Title Patient will demonstrate safe gait pattern >500 ft with rollator on all surfaces to reduce risk for falls    Status Achieved   01/03/21                Plan - 01/25/21 1026    Clinical Impression Statement [name] reports R dorsal lateral ankle pain today which she states may be related to remote h/o R ankle fracture with ORIF in 2006. R ankle briefly assessed with ROM and strength WFL/WNL. Review of B heel raises from [place] program revealing tendency for R>L ankle/foot supination which may be causing stress at site of prior injury, therefore added ball between ankles to promote  more neutral alignment. Also reviewed gastroc/soleus stretches and suggested pt try stretches before attempting toe raises to allow for improved ROM with decreased strain at ankle. [name] noting ankle pain resolved following above stretching and modifications to HEP. Pt also inquiring if there would be any additional exercises she could do to help with her upper body strength beyond bicep curls with small weight - introduced scapular strengthening with red TB rows to promote upper body and postural strengthening while also promoting core activation to facilitate improved balance reactions.    Comorbidities Recurrent falls - at least 5 falls in past 6 months, 8-9 since start of year & 7 major falls in past 5-6 yrs (4 concussions, finger fracture); costochondritis, cellulitis of R breast, fibromyalgia, chronic  LBP since ~age 35, DDD, sciatica, osteoporosis, degenerative scoliosis, peripheral neuropathy, vertigo, R knee pain, CAD, COPD, HTN, h/o lumpectomy for R breast cancer 2013, ORIF R ankle s/p fall 2006    Stability/Clinical Decision Making Evolving/Moderate complexity    Rehab Potential Good    PT Frequency 2x / week    PT Duration 8 weeks    PT Treatment/Interventions ADLs/Self Care Home Management;Cryotherapy;Electrical Stimulation;Iontophoresis 4mg /ml Dexamethasone;Moist Heat;Ultrasound;DME Instruction;Gait training;Stair training;Functional mobility training;Therapeutic activities;Therapeutic exercise;Balance training;Neuromuscular re-education;Patient/family education;Manual techniques;Passive range of motion;Dry needling;Taping;Vasopneumatic Device;Vestibular;Visual/perceptual remediation/compensation;Spinal Manipulations    PT Next Visit Plan MD PN for appt 01/28/21; HEP/[place] program review in prep for transition to HEP at end of current POC; review of gait training with rollator    PT Home Exercise Plan 10/27 - [place] Prevention Program; 12/13 - seated 3-way prayer stretch; 01/03/21 - [place] progression - B heel raises to include alt single LE eccentric lowering; 1/13 - addition of yellow TB or cuff weight to [place] strengthening exercises; 1/20 - mod thomas hip flexor stretch; 2/1 - review of gastroc/soleus stretches prior to toe raises and additon of ball btw ankles with heel raises for [employer], red TB rows    Consulted and Agree with Plan of Care Patient           Patient will benefit from skilled therapeutic intervention in order to improve the following deficits and impairments:  Abnormal gait,Decreased activity tolerance,Decreased balance,Decreased coordination,Decreased endurance,Decreased knowledge of precautions,Decreased mobility,Decreased safety awareness,Decreased strength,Difficulty walking,Dizziness,Increased fascial restricitons,Increased muscle spasms,Impaired perceived  functional ability,Impaired flexibility,Improper body mechanics,Postural dysfunction,Pain  Visit Diagnosis: Repeated falls  Unsteadiness on feet  Muscle weakness (generalized)  Other abnormalities of gait and mobility  Dizziness and giddiness  Chronic bilateral low back pain without sciatica  Intractable acute post-traumatic headache  Other symptoms and signs involving the nervous system     Problem List Patient Active Problem List   Diagnosis Date Noted  . Low back pain 09/08/2020  . OSA and COPD overlap syndrome ([place]) 06/10/2020  . Serotonin withdrawal syndrome without complication 99/83/3825  . Recurrent falls 05/02/2020  . Statin intolerance 02/29/2020  . RLS (restless legs syndrome) 11/09/2019  . Kidney stone 02/26/2019  . Recurrent sinusitis 02/25/2019  . Hx of colonic polyp 02/25/2019  . DDD (degenerative disc disease), cervical 09/17/2018  . Degenerative scoliosis 04/22/2018  . Primary insomnia 06/25/2017  . Chronic interstitial cystitis 02/01/2017  . Cough 01/09/2017  . Right arm pain 07/20/2016  . Deviated septum 05/12/2016  . Nasal turbinate hypertrophy 05/12/2016  . Dysphagia   . Allergic rhinitis 01/25/2016  . Other and combined forms of senile cataract 07/15/2015  . Dyspnea   . Left hip pain 04/30/2015  . Sciatica 04/30/2015  . Knee pain, right 04/30/2015  .  Bilateral carpal tunnel syndrome 03/04/2015  . Hyperlipidemia 03/01/2015  . Pulmonary nodules 02/12/2015  . External hemorrhoids 02/05/2015  . Chronic night sweats 01/08/2015  . Atypical chest pain 01/01/2015  . Renal insufficiency 07/16/2014  . Memory loss 05/22/2014  . Peripheral neuropathy 05/22/2014  . Abdominal pain 10/26/2013  . Headache 10/13/2013  . Arthralgia 08/18/2013  . ANA positive 07/29/2013  . Depression 02/05/2013  . Family history of malignant hyperthermia 02/05/2013  . Malignant neoplasm of lower-outer quadrant of right breast of female, estrogen receptor positive  ([place]) 01/03/2013  . Splenic lesion 09/02/2012  . Asthma, allergic 08/05/2012  . GERD (gastroesophageal reflux disease) 06/03/2012  . Edema 04/08/2012  . Stricture and stenosis of esophagus 10/19/2011  . Functional constipation 07/21/2011  . Nonspecific (abnormal) findings on radiological and other examination of biliary tract 07/21/2011  . Osteoporosis 04/17/2011  . Leg pain 02/24/2011  . FIBROCYSTIC BREAST DISEASE, HX OF 10/18/2010  . Essential hypertension 08/25/2010  . CAD in native artery 08/25/2010  . TOBACCO ABUSE, HX OF 08/25/2010  . GENERALIZED ANXIETY DISORDER 08/03/2010  . Fibromyalgia 01/11/2010    Percival Spanish, PT, MPT 01/25/2021, 6:44 PM  [facility] [address]  [place], [place], [address] Phone: [phone]   Fax:  [phone]  Name: BASILIA STUCKERT MRN: [id number] Date of Birth: 11-29-1946

## 2021-01-27 ENCOUNTER — Encounter: Payer: Self-pay | Admitting: Physical Therapy

## 2021-01-27 ENCOUNTER — Ambulatory Visit: Payer: Medicare Other | Admitting: Physical Therapy

## 2021-01-27 ENCOUNTER — Other Ambulatory Visit: Payer: Self-pay

## 2021-01-27 DIAGNOSIS — G8929 Other chronic pain: Secondary | ICD-10-CM

## 2021-01-27 DIAGNOSIS — R42 Dizziness and giddiness: Secondary | ICD-10-CM

## 2021-01-27 DIAGNOSIS — G44311 Acute post-traumatic headache, intractable: Secondary | ICD-10-CM

## 2021-01-27 DIAGNOSIS — R2689 Other abnormalities of gait and mobility: Secondary | ICD-10-CM | POA: Diagnosis not present

## 2021-01-27 DIAGNOSIS — R29818 Other symptoms and signs involving the nervous system: Secondary | ICD-10-CM

## 2021-01-27 DIAGNOSIS — R296 Repeated falls: Secondary | ICD-10-CM

## 2021-01-27 DIAGNOSIS — R2681 Unsteadiness on feet: Secondary | ICD-10-CM | POA: Diagnosis not present

## 2021-01-27 DIAGNOSIS — M6281 Muscle weakness (generalized): Secondary | ICD-10-CM | POA: Diagnosis not present

## 2021-01-27 DIAGNOSIS — M545 Low back pain, unspecified: Secondary | ICD-10-CM | POA: Diagnosis not present

## 2021-01-27 NOTE — Therapy (Addendum)
[facility] [address]  [place], [place], [address] Phone: [phone]   Fax:  [phone]  Physical Therapy Treatment / Progress Note / Discharge Summary  Patient Details  Name: [name] MRN: [id number] Date of Birth: 1946-08-29 Referring Provider (PT): [name], MD & Darryl Nestle, MD   Progress Note  Reporting Period 12/06/20 to 01/27/21  See note below for Objective Data and Assessment of Progress/Goals.      Encounter Date: 01/27/2021   PT End of Session - 01/27/21 1105    Visit Number 39    Number of Visits 61    Date for PT Re-Evaluation 01/31/21    Authorization Type [employer]    PT Start Time 1105    PT Stop Time 1212    PT Time Calculation (min) 67 min    Activity Tolerance Patient tolerated treatment well    Behavior During Therapy WFL for tasks assessed/performed           Past Medical History:  Diagnosis Date  . Allergy   . Anemia   . Anxiety    past hx   . Arthritis   . Asthma   . Atrial flutter ([place])    past history- not current  . CAD (coronary artery disease) CARDIOLOGIST--  DR Angelena Form   mild non-obstructive cad  . Cancer ([place])    right  . Cataract    bilaterally removed   . Chronic constipation   . Chronic kidney disease    interstitial cystitis  . Concussion    x 3  . COPD (chronic obstructive pulmonary disease) ([place])   . Depression    past hx   . Family history of malignant hyperthermia    father had this  . Fibromyalgia   . Frequency of urination   . GERD (gastroesophageal reflux disease)   . H/O hiatal hernia   . History of basal cell carcinoma excision    X2  . History of breast cancer ONCOLOGIST-- DR Jana Hakim---  NO RECURRANCE   DX 07/2012;  LOW GRADE DCIS  ER+PR+  ----  S/P RIGHT LUMPECTOMY WITH NEGATIVE MARGINS/   RADIATION ENDED 11/2012  . History of chronic bronchitis   . History of colonic polyps   . History of tachycardia    CONTROLLED   WITH ATENOLOL  . Hyperlipidemia   . Hypertension   . Neuromuscular disorder (HCC)    fibromyalgia  . Neuropathy   . Osteoporosis 01/2019   T score -2.2 stable/improved from prior study  . Pelvic pain   . Personal history of radiation therapy   . S/P radiation therapy 11/12/12 - 12/05/12   right Breast  . Sepsis ([place]) 2014   from UTI   . Sinus headache   . Urgency of urination     Past Surgical History:  Procedure Laterality Date  . [place] STUDY N/A 04/03/2016   Procedure: [place] STUDY;  Surgeon: Manus Gunning, MD;  Location: [facility];  Service: Gastroenterology;  Laterality: N/A;  . BREAST LUMPECTOMY Right 10-11-2012   W/ SLN BX  . CARDIAC CATHETERIZATION  09-13-2007  DR Lia Foyer   WELL-PRESERVED LVF/  DIFFUSE SCATTERED CORONARY CALCIFACATION AND ATHEROSCLEROSIS WITHOUT OBSTRUCTION  . CARDIAC CATHETERIZATION  08-04-2010  DR Angelena Form   NON-OBSTRUCTIVE CAD/  pLAD 40%/  oLAD 30%/  mLAD 30%/  pRCA 30%/  EF 60%  . CARDIOVASCULAR STRESS TEST  06-18-2012  DR Angelena Form   LOW RISK  NUCLEAR STUDY/  SMALL FIXED AREA OF MODERATELY DECREASED UPTAKE IN ANTEROSEPTAL WALL WHICH MAY BE ARTIFACTUAL/  NO ISCHEMIA/  EF 68%  . COLONOSCOPY  09-29-2010  . CYSTOSCOPY    . CYSTOSCOPY WITH HYDRODISTENSION AND BIOPSY N/A 03/06/2014   Procedure: CYSTOSCOPY/HYDRODISTENSION/ INSTILATION OF MARCAINE AND PYRIDIUM;  Surgeon: Ailene Rud, MD;  Location: [facility];  Service: Urology;  Laterality: N/A;  . ESOPHAGEAL MANOMETRY N/A 04/03/2016   Procedure: ESOPHAGEAL MANOMETRY (EM);  Surgeon: Manus Gunning, MD;  Location: [facility];  Service: Gastroenterology;  Laterality: N/A;  . EXTRACORPOREAL SHOCK WAVE LITHOTRIPSY Left 02/06/2019   Procedure: EXTRACORPOREAL SHOCK WAVE LITHOTRIPSY (ESWL);  Surgeon: Kathie Rhodes, MD;  Location: [facility];  Service: Urology;  Laterality: Left;  . NASAL SINUS SURGERY  1985  . ORIF RIGHT ANKLE  FX  2006  . POLYPECTOMY    . REMOVAL  VOCAL CORD CYST  FEB 2014  . RIGHT BREAST BX  08-23-2012  . RIGHT HAND SURGERY  X3  LAST ONE 2009   INCLUDES  ORIF RIGHT 5TH FINGER AND REVISION TWICE  . SKIN CANCER EXCISION    . TONSILLECTOMY AND ADENOIDECTOMY  AGE 37  . TOTAL ABDOMINAL HYSTERECTOMY W/ BILATERAL SALPINGOOPHORECTOMY  1982   W/  APPENDECTOMY  . TRANSTHORACIC ECHOCARDIOGRAM  06-24-2012   GRADE I DIASTOLIC DYSFUNCTION/  EF 55-60%/  MILD MR  . UPPER GASTROINTESTINAL ENDOSCOPY      There were no vitals filed for this visit.   Subjective Assessment - 01/27/21 1109    Subjective Pt reports her numbness in her feet seems worse today.    Pertinent History Recurrent falls - 3 in past 6 months, 7 major falls in past 5-6 yrs (4 concussions, finger fracture); costochondritis, cellulitis of R breast, fibromyalgia, chronic LBP since ~age 59, DDD, sciatica, osteoporosis, degenerative scoliosis, peripheral neuropathy, vertigo, R knee pain, CAD, COPD, HTN, h/o lumpectomy for R breast cancer 2013, ORIF R ankle s/p fall 2006    Patient Stated Goals "Be able to gain some strength in my legs to help with my back. To be able stand w/o feeling like I'm swaying and walk w/o tripping."    Currently in Pain? Yes    Pain Score 5    4-5/10   Pain Location Leg    Pain Orientation Right;Left   R>L   Pain Descriptors / Indicators Pins and needles;Burning;Numbness   "prickly"   Pain Type Chronic pain    Pain Radiating Towards numbness from feet extending up to knees    Pain Frequency Constant              [facility] PT Assessment - 01/27/21 1105      Assessment   Medical Diagnosis Multiple falls, axonal neuropathy    Referring Provider (PT) [name], MD & Darryl Nestle, MD    Next MD Visit 01/28/21 - Dr. Patsey Berthold & 03/08/21 - Dr. Charlett Blake      Strength   Right Hip Flexion 4-/5    Right Hip Extension 4/5    Right Hip External Rotation  4/5    Right Hip Internal Rotation 4+/5    Right Hip ABduction 4+/5    Right Hip ADduction 4-/5    Left  Hip Flexion 4/5    Left Hip Extension 4/5    Left Hip External Rotation 4/5    Left Hip Internal Rotation 4+/5    Left Hip ABduction 4+/5    Left Hip ADduction 4-/5    Right Knee Flexion  4+/5    Right Knee Extension 5/5    Left Knee Flexion 4+/5    Left Knee Extension 5/5    Right Ankle Dorsiflexion 4/5    Right Ankle Plantar Flexion 3/5   limited by lateral ankle pain today   Right Ankle Inversion 4/5    Right Ankle Eversion 4/5    Left Ankle Dorsiflexion 4/5    Left Ankle Plantar Flexion 4-/5    Left Ankle Inversion 4+/5    Left Ankle Eversion 4+/5      Berg Balance Test   Sit to Stand Able to stand without using hands and stabilize independently    Standing Unsupported Able to stand safely 2 minutes    Sitting with Back Unsupported but Feet Supported on Floor or Stool Able to sit safely and securely 2 minutes    Stand to Sit Sits safely with minimal use of hands    Transfers Able to transfer safely, minor use of hands    Standing Unsupported with Eyes Closed Able to stand 10 seconds with supervision    Standing Unsupported with Feet Together Able to place feet together independently and stand 1 minute safely    From Standing, Reach Forward with Outstretched Arm Can reach confidently >25 cm (10")    From Standing Position, Pick up Object from Floor Able to pick up shoe safely and easily    From Standing Position, Turn to Look Behind Over each Shoulder Looks behind from both sides and weight shifts well    Turn 360 Degrees Able to turn 360 degrees safely in 4 seconds or less    Standing Unsupported, Alternately Place Feet on Step/Stool Able to stand independently and safely and complete 8 steps in 20 seconds    Standing Unsupported, One Foot in Front Able to plae foot ahead of the other independently and hold 30 seconds    Standing on One Leg Tries to lift leg/unable to hold 3 seconds but remains standing independently    Total Score 51    Berg comment: 46-51 moderate fall risk  (>50%)      Functional Gait  Assessment   Gait Level Surface Walks 20 ft in less than 5.5 sec, no assistive devices, good speed, no evidence for imbalance, normal gait pattern, deviates no more than 6 in outside of the 12 in walkway width.    Change in Gait Speed Able to change speed, demonstrates mild gait deviations, deviates 6-10 in outside of the 12 in walkway width, or no gait deviations, unable to achieve a major change in velocity, or uses a change in velocity, or uses an assistive device.    Gait with Horizontal Head Turns Performs head turns smoothly with slight change in gait velocity (eg, minor disruption to smooth gait path), deviates 6-10 in outside 12 in walkway width, or uses an assistive device.    Gait with Vertical Head Turns Performs task with slight change in gait velocity (eg, minor disruption to smooth gait path), deviates 6 - 10 in outside 12 in walkway width or uses assistive device    Gait and Pivot Turn Pivot turns safely within 3 sec and stops quickly with no loss of balance.    Step Over Obstacle Is able to step over one shoe box (4.5 in total height) without changing gait speed. No evidence of imbalance.    Gait with Narrow Base of Support Ambulates 4-7 steps.    Gait with Eyes Closed Walks 20 ft, slow speed, abnormal gait pattern,  evidence for imbalance, deviates 10-15 in outside 12 in walkway width. Requires more than 9 sec to ambulate 20 ft.    Ambulating Backwards Walks 20 ft, uses assistive device, slower speed, mild gait deviations, deviates 6-10 in outside 12 in walkway width.    Steps Alternating feet, must use rail.    Total Score 20    FGA comment: 19-24 = medium risk fall                         [facility] Adult PT Treatment/Exercise - 01/27/21 1105      Ambulation/Gait   Ambulation/Gait Assistance 5: Supervision;6: Modified independent (Device/Increase time)    Ambulation Distance (Feet) 270 Feet    Assistive device Rollator    Gait Pattern  Within Functional Limits;Step-through pattern    Ambulation Surface Level;Indoor   Unable to reassess outdoor mobility today due to inclement weather.   Gait Comments Pt able to demonstrate good recall of safety and proper gait pattern with rollator. She notes much more confidence and sense of security with rollator vs with cane or no AD.      Knee/Hip Exercises: Aerobic   Nustep L5 x 6 min (UE/LE)                    PT Short Term Goals - 06/22/20 1015      PT SHORT TERM GOAL #1   Title Patient will be independent with initial HEP    Status Achieved   06/22/20     PT SHORT TERM GOAL #2   Title Complete FGA assessment    Status Achieved   06/22/20            PT Long Term Goals - 01/27/21 1117      PT LONG TERM GOAL #1   Title Patient will be independent with ongoing/advanced HEP    Status Partially Met   01/27/21: Pt reports no concerns with HEP but will plan for final review next visit   Target Date 01/31/21      PT LONG TERM GOAL #2   Title Patient will demonstrate improved B LE strength to >/= 4/5 to 4+/5 for improved stability and ease of mobility    Status Partially Met   01/27/21: met except B hip adduction & R hip flexion 4-/5, R ankle 3/5 (d/t lateral ankle pain with attempt at single leg heel raise)   Target Date 01/31/21      PT LONG TERM GOAL #3   Title Patient will improve Berg score to >/= 52/56 to improve safety stability with ADLs in standing and reduce risk for falls    Baseline previously met on 09/16/20 - Berg = 52/56, but pt had regressed to 46/56 as of 12/09/20    Status On-going   01/27/21: Merrilee Jansky = 51/56     PT LONG TERM GOAL #4   Title Patient will report no further instances of falls    Status Achieved   01/27/21 - no falls since 09/02/20, but continued occasional LOB (better when using cane but has not purchased rollator due to still awaiting MD order)     PT LONG TERM GOAL #5   Title Patient will improve FGA score to >/= 24/30 to improve gait  stability and reduce risk for falls    Baseline previously met on 10/11/20 - FGA = 21/30, but pt had regressed to 15/30 as of 12/09/20    Status On-going   01/27/21: FGA =  20/30   Target Date 01/31/21      PT LONG TERM GOAL #6   Title Patient to report no dizziness remaining with bed mobility.    Status Achieved   met as of 08/24/20; Will reassess as indicated     PT LONG TERM GOAL #7   Title Patient will demonstrate safe gait pattern >500 ft with rollator on all surfaces to reduce risk for falls    Status Achieved   01/03/21                Plan - 01/27/21 1212    Clinical Impression Statement Genene has demonstrated good recovery from her setback in December 2021 where declines had occurred with strength, balance and gait safety/stability after extended absence from PT due to a death in the family. Her overall B LE strength is now grossly >/= 4/5 with exceptions of B hip adduction & R hip flexion 4+/5 as well as weightbearing R ankle PF limited by lateral ankle pain today. Her standardized balance testing scores have recovered to 51/56 for Berg (previously 52/56 at best as of 09/16/20 but had declined to 46/56 as of 12/09/20) and 20/30 for FGA (previously 21/30 at best as of 10/11/20 but had declined to 15/30 as of 12/09/20). Despite improvements in strength and balance with no falls since September 2021, Ceilidh continues to be anxious about falling and feels much more confident and secure with use of rollator style walker for ambulation and plans to f/u with MD to request order for rollator to allow her to obtain one using her insurance. LTGs met or nearly met and Mahina is nearing the end of her current POC with one visit remaining - will plan for final review and clarification of recommended ongoing HEP for strengthening and balance related activities with transition to HEP following final visit unless further issues identified at MD visit on 01/28/21.    Comorbidities Recurrent falls - at least 5  falls in past 6 months, 8-9 since start of year & 7 major falls in past 5-6 yrs (4 concussions, finger fracture); costochondritis, cellulitis of R breast, fibromyalgia, chronic LBP since ~age 6, DDD, sciatica, osteoporosis, degenerative scoliosis, peripheral neuropathy, vertigo, R knee pain, CAD, COPD, HTN, h/o lumpectomy for R breast cancer 2013, ORIF R ankle s/p fall 2006    Stability/Clinical Decision Making Evolving/Moderate complexity    Rehab Potential Good    PT Frequency 2x / week    PT Duration 8 weeks    PT Treatment/Interventions ADLs/Self Care Home Management;Cryotherapy;Electrical Stimulation;Iontophoresis 1m/ml Dexamethasone;Moist Heat;Ultrasound;DME Instruction;Gait training;Stair training;Functional mobility training;Therapeutic activities;Therapeutic exercise;Balance training;Neuromuscular re-education;Patient/family education;Manual techniques;Passive range of motion;Dry needling;Taping;Vasopneumatic Device;Vestibular;Visual/perceptual remediation/compensation;Spinal Manipulations    PT Next Visit Plan HEP/[place] program review in prep for transition to HEP    PT Home Exercise Plan 10/27 - [facility]; 12/13 - seated 3-way prayer stretch; 01/03/21 - [place] progression - B heel raises to include alt single LE eccentric lowering; 1/13 - addition of yellow TB or cuff weight to [place] exercises; 1/20 - mod thomas hip flexor stretch; 2/1 - review of gastroc/soleus stretches prior to toe raises and additon of ball btw ankles with heel raises for [employer] red TB rows    Consulted and Agree with Plan of Care Patient           Patient will benefit from skilled therapeutic intervention in order to improve the following deficits and impairments:  Abnormal gait,Decreased activity tolerance,Decreased balance,Decreased coordination,Decreased endurance,Decreased knowledge of precautions,Decreased mobility,Decreased  safety awareness,Decreased strength,Difficulty  walking,Dizziness,Increased fascial restricitons,Increased muscle spasms,Impaired perceived functional ability,Impaired flexibility,Improper body mechanics,Postural dysfunction,Pain  Visit Diagnosis: Repeated falls  Unsteadiness on feet  Muscle weakness (generalized)  Other abnormalities of gait and mobility  Dizziness and giddiness  Chronic bilateral low back pain without sciatica  Intractable acute post-traumatic headache  Other symptoms and signs involving the nervous system     Problem List Patient Active Problem List   Diagnosis Date Noted  . Low back pain 09/08/2020  . OSA and COPD overlap syndrome ([place]) 06/10/2020  . Serotonin withdrawal syndrome without complication 37/34/2876  . Recurrent falls 05/02/2020  . Statin intolerance 02/29/2020  . RLS (restless legs syndrome) 11/09/2019  . Kidney stone 02/26/2019  . Recurrent sinusitis 02/25/2019  . Hx of colonic polyp 02/25/2019  . DDD (degenerative disc disease), cervical 09/17/2018  . Degenerative scoliosis 04/22/2018  . Primary insomnia 06/25/2017  . Chronic interstitial cystitis 02/01/2017  . Cough 01/09/2017  . Right arm pain 07/20/2016  . Deviated septum 05/12/2016  . Nasal turbinate hypertrophy 05/12/2016  . Dysphagia   . Allergic rhinitis 01/25/2016  . Other and combined forms of senile cataract 07/15/2015  . Dyspnea   . Left hip pain 04/30/2015  . Sciatica 04/30/2015  . Knee pain, right 04/30/2015  . Bilateral carpal tunnel syndrome 03/04/2015  . Hyperlipidemia 03/01/2015  . Pulmonary nodules 02/12/2015  . External hemorrhoids 02/05/2015  . Chronic night sweats 01/08/2015  . Atypical chest pain 01/01/2015  . Renal insufficiency 07/16/2014  . Memory loss 05/22/2014  . Peripheral neuropathy 05/22/2014  . Abdominal pain 10/26/2013  . Headache 10/13/2013  . Arthralgia 08/18/2013  . ANA positive 07/29/2013  . Depression 02/05/2013  . Family history of malignant hyperthermia 02/05/2013  .  Malignant neoplasm of lower-outer quadrant of right breast of female, estrogen receptor positive ([place]) 01/03/2013  . Splenic lesion 09/02/2012  . Asthma, allergic 08/05/2012  . GERD (gastroesophageal reflux disease) 06/03/2012  . Edema 04/08/2012  . Stricture and stenosis of esophagus 10/19/2011  . Functional constipation 07/21/2011  . Nonspecific (abnormal) findings on radiological and other examination of biliary tract 07/21/2011  . Osteoporosis 04/17/2011  . Leg pain 02/24/2011  . FIBROCYSTIC BREAST DISEASE, HX OF 10/18/2010  . Essential hypertension 08/25/2010  . CAD in native artery 08/25/2010  . TOBACCO ABUSE, HX OF 08/25/2010  . GENERALIZED ANXIETY DISORDER 08/03/2010  . Fibromyalgia 01/11/2010    Percival Spanish, PT, MPT 01/27/2021, 12:43 PM  [facility] [address]  [place], [place], [address] Phone: [phone]   Fax:  [phone]  Name: [name] MRN: [id number] Date of Birth: 1946-12-20   PHYSICAL THERAPY DISCHARGE SUMMARY  Visits from Start of Care: 39  Current functional level related to goals / functional outcomes:   Refer to above clinical impression for status as of last visit on 01/27/2021. Patient cancelled her last 2 appointments due to inclement weather and illness. She opted to be placed on hold for 30 days rather than rescheduling and has not needed to return to PT, therefore will proceed with discharge from PT for this episode.   Remaining deficits:   As above.   Education / Equipment:   HEP  Plan: Patient agrees to discharge.  Patient goals were not met. Patient is being discharged due to being pleased with the current functional level.  ?????     Percival Spanish, PT, MPT 04/11/21, 11:51 AM  [facility]  [place], [place], [address] Phone: [phone]   Fax:  [phone]

## 2021-01-28 DIAGNOSIS — G629 Polyneuropathy, unspecified: Secondary | ICD-10-CM | POA: Diagnosis not present

## 2021-01-31 ENCOUNTER — Other Ambulatory Visit: Payer: Self-pay

## 2021-01-31 ENCOUNTER — Telehealth: Payer: Self-pay | Admitting: Gastroenterology

## 2021-01-31 ENCOUNTER — Ambulatory Visit: Payer: Medicare Other | Admitting: Physical Therapy

## 2021-01-31 MED ORDER — DEXLANSOPRAZOLE 60 MG PO CPDR
60.0000 mg | DELAYED_RELEASE_CAPSULE | Freq: Every day | ORAL | 2 refills | Status: DC
Start: 2021-01-31 — End: 2021-02-22

## 2021-01-31 NOTE — Telephone Encounter (Signed)
New Script sent.

## 2021-01-31 NOTE — Telephone Encounter (Signed)
Pt is requesting a prescription for her Dexilant.  [place]

## 2021-02-07 DIAGNOSIS — D1801 Hemangioma of skin and subcutaneous tissue: Secondary | ICD-10-CM | POA: Diagnosis not present

## 2021-02-07 DIAGNOSIS — D225 Melanocytic nevi of trunk: Secondary | ICD-10-CM | POA: Diagnosis not present

## 2021-02-07 DIAGNOSIS — L814 Other melanin hyperpigmentation: Secondary | ICD-10-CM | POA: Diagnosis not present

## 2021-02-07 DIAGNOSIS — L820 Inflamed seborrheic keratosis: Secondary | ICD-10-CM | POA: Diagnosis not present

## 2021-02-07 NOTE — Telephone Encounter (Addendum)
Deductible  n/a  OOP MAX $4200 $42met)  Annual exam NEEDS ANNUAL WILL HAVE PROLIA SAME DAY 02/22/2021  Calcium 8.9            Date 09/15/2020  Upcoming dental procedures NO  Prior Authorization needed YES on file until 06/18/2021  Pt estimated Cost $213    PROLIA SAME DAY AS APPT 02/22/2021   Coverage Details:20% of one dose,, $0 admin fee

## 2021-02-09 ENCOUNTER — Ambulatory Visit: Payer: Medicare Other | Admitting: Physical Therapy

## 2021-02-10 ENCOUNTER — Telehealth: Payer: Self-pay

## 2021-02-10 NOTE — Telephone Encounter (Signed)
Sorry to hear this, why don't we try her on protonix 40mg  BID for 1 month to see if that works, in light of [place] not being covered. Thanks

## 2021-02-10 NOTE — Telephone Encounter (Signed)
Prior Auth for Dexilant has been denied. Good Rx price is over $200 a month.  Patient has tried and failed omeprazole 20 mg and 40 mg BID. Please advise

## 2021-02-11 MED ORDER — PANTOPRAZOLE SODIUM 40 MG PO TBEC: 40.0000 mg | DELAYED_RELEASE_TABLET | Freq: Two times a day (BID) | ORAL | 0 refills | Status: DC

## 2021-02-11 NOTE — Telephone Encounter (Signed)
Called and spoke with patient about trying Pantoprazole 40 mg twice daily. She is willing to try for 1 month as discussed over the phone. She will contact the office and let us know how she is doing and how we will proceed.

## 2021-02-18 DIAGNOSIS — M1711 Unilateral primary osteoarthritis, right knee: Secondary | ICD-10-CM | POA: Diagnosis not present

## 2021-02-18 DIAGNOSIS — M25561 Pain in right knee: Secondary | ICD-10-CM | POA: Diagnosis not present

## 2021-02-22 ENCOUNTER — Ambulatory Visit: Payer: Medicare Other | Admitting: Obstetrics & Gynecology

## 2021-02-22 ENCOUNTER — Other Ambulatory Visit: Payer: Self-pay

## 2021-02-22 ENCOUNTER — Encounter: Payer: Self-pay | Admitting: Obstetrics & Gynecology

## 2021-02-22 VITALS — BP 130/80 | Ht 63.25 in | Wt 139.0 lb

## 2021-02-22 DIAGNOSIS — Z853 Personal history of malignant neoplasm of breast: Secondary | ICD-10-CM

## 2021-02-22 DIAGNOSIS — Z01419 Encounter for gynecological examination (general) (routine) without abnormal findings: Secondary | ICD-10-CM | POA: Diagnosis not present

## 2021-02-22 DIAGNOSIS — M810 Age-related osteoporosis without current pathological fracture: Secondary | ICD-10-CM

## 2021-02-22 DIAGNOSIS — Z780 Asymptomatic menopausal state: Secondary | ICD-10-CM | POA: Diagnosis not present

## 2021-02-22 DIAGNOSIS — Z90722 Acquired absence of ovaries, bilateral: Secondary | ICD-10-CM

## 2021-02-22 DIAGNOSIS — Z90710 Acquired absence of both cervix and uterus: Secondary | ICD-10-CM | POA: Diagnosis not present

## 2021-02-22 DIAGNOSIS — Z9079 Acquired absence of other genital organ(s): Secondary | ICD-10-CM

## 2021-02-22 DIAGNOSIS — R300 Dysuria: Secondary | ICD-10-CM | POA: Diagnosis not present

## 2021-02-22 MED ORDER — DENOSUMAB 60 MG/ML ~~LOC~~ SOSY
60.0000 mg | PREFILLED_SYRINGE | Freq: Once | SUBCUTANEOUS | Status: AC
  Administered 2021-02-22: 60 mg via SUBCUTANEOUS

## 2021-02-22 NOTE — Addendum Note (Signed)
Addended by: Thurnell Garbe A on: 02/22/2021 01:45 PM   Modules accepted: Orders

## 2021-02-22 NOTE — Progress Notes (Signed)
[name] 05-Jul-1946 [id number]   History:    75 y.o. G3P3L3 Married  RP:  Established patient presenting for annual gyn exam   HPI: S/P Total Hysterectomy/BSO.  No pelvic pain.  Sexually active.  Normal Paps, last one 10/2018.  No previous cervical treatment.  H/O Rt Breast Ca.  Breasts normal currently.  3 recent falls with no fracture.  H/O Osteoporosis, improved to Osteopenia on last BD 01/2019 on Prolia.  Colono 2019.  BMI 24.43.  Health labs with Fam MD.  Past medical history,surgical history, family history and social history were all reviewed and documented in the EPIC chart.  Gynecologic History No LMP recorded. Patient has had a hysterectomy.  Obstetric History OB History  Gravida Para Term Preterm AB Living  3 3       3   SAB IAB Ectopic Multiple Live Births               # Outcome Date GA Lbr Len/2nd Weight Sex Delivery Anes PTL Lv  3 Para           2 Para           1 Para              ROS: A ROS was performed and pertinent positives and negatives are included in the history.  GENERAL: No fevers or chills. HEENT: No change in vision, no earache, sore throat or sinus congestion. NECK: No pain or stiffness. CARDIOVASCULAR: No chest pain or pressure. No palpitations. PULMONARY: No shortness of breath, cough or wheeze. GASTROINTESTINAL: No abdominal pain, nausea, vomiting or diarrhea, melena or bright red blood per rectum. GENITOURINARY: No urinary frequency, urgency, hesitancy or dysuria. MUSCULOSKELETAL: No joint or muscle pain, no back pain, no recent trauma. DERMATOLOGIC: No rash, no itching, no lesions. ENDOCRINE: No polyuria, polydipsia, no heat or cold intolerance. No recent change in weight. HEMATOLOGICAL: No anemia or easy bruising or bleeding. NEUROLOGIC: No headache, seizures, numbness, tingling or weakness. PSYCHIATRIC: No depression, no loss of interest in normal activity or change in sleep pattern.     Exam:   BP 130/80    Ht 5' 3.25" (1.607 m)    Wt 139  lb (63 kg)    BMI 24.43 kg/m   Body mass index is 24.43 kg/m.  General appearance : Well developed well nourished female. No acute distress HEENT: Eyes: no retinal hemorrhage or exudates,  Neck supple, trachea midline, no carotid bruits, no thyroidmegaly Lungs: Clear to auscultation, no rhonchi or wheezes, or rib retractions  Heart: Regular rate and rhythm, no murmurs or gallops Breast:Examined in sitting and supine position were symmetrical in appearance, no palpable masses or tenderness,  no skin retraction, no nipple inversion, no nipple discharge, no skin discoloration, no axillary or supraclavicular lymphadenopathy Abdomen: no palpable masses or tenderness, no rebound or guarding Extremities: no edema or skin discoloration or tenderness  Pelvic: Vulva: Normal             Vagina: No gross lesions or discharge  Cervix/Uterus absent  Adnexa  Without masses or tenderness  Anus: Normal   Assessment/Plan:  75 y.o. female for annual exam   1. Encounter for gynecological examination without abnormal finding Gynecologic exam status post total hysterectomy with BSO.  Last Pap test November 2019 was negative, no indication to repeat a Pap test at this time.  Breast exam normal status post right lumpectomy for breast cancer.  Colonoscopy 2019.  Good body mass index at 24.43.  Health labs with family physician.  2. Status post total hysterectomy and bilateral salpingo-oophorectomy (BSO)  3. Postmenopause Well on no hormone replacement therapy.  4. Age-related osteoporosis without current pathological fracture History of osteoporosis on Prolia.  Last bone density February 2020 showed improvement with osteopenia.  Repeat a bone density now.  Continue on Prolia, vitamin D supplements, calcium intake total of 1.5 g/day and regular weightbearing physical activities. - DG Bone Density; Future  5. History of breast cancer Normal breast exam.  Mammo Neg 03/2020,repeat screening mammo 03/2021.  6.  Dysuria Pending urine analysis and culture.  Management per results. - Urinalysis,Complete w/RFL Culture   Princess Bruins MD, 12:30 PM 02/22/2021

## 2021-02-24 LAB — URINE CULTURE
MICRO NUMBER:: 11592404
Result:: NO GROWTH
SPECIMEN QUALITY:: ADEQUATE

## 2021-02-24 LAB — URINALYSIS, COMPLETE W/RFL CULTURE
Bilirubin Urine: NEGATIVE
Glucose, UA: NEGATIVE
Hyaline Cast: NONE SEEN /LPF
Ketones, ur: NEGATIVE
Nitrites, Initial: NEGATIVE
Protein, ur: NEGATIVE
Specific Gravity, Urine: 1.015 (ref 1.001–1.03)
pH: 7 (ref 5.0–8.0)

## 2021-02-24 LAB — CULTURE INDICATED

## 2021-02-25 ENCOUNTER — Encounter: Payer: Self-pay | Admitting: *Deleted

## 2021-03-03 DIAGNOSIS — Z79899 Other long term (current) drug therapy: Secondary | ICD-10-CM | POA: Diagnosis not present

## 2021-03-07 ENCOUNTER — Other Ambulatory Visit: Payer: Self-pay

## 2021-03-08 ENCOUNTER — Encounter: Payer: Self-pay | Admitting: Family Medicine

## 2021-03-08 ENCOUNTER — Ambulatory Visit (INDEPENDENT_AMBULATORY_CARE_PROVIDER_SITE_OTHER): Payer: Medicare Other | Admitting: Family Medicine

## 2021-03-08 ENCOUNTER — Other Ambulatory Visit: Payer: Self-pay | Admitting: Family Medicine

## 2021-03-08 VITALS — BP 116/64 | HR 85 | Temp 98.2°F | Resp 16 | Ht 63.0 in | Wt 139.2 lb

## 2021-03-08 DIAGNOSIS — E538 Deficiency of other specified B group vitamins: Secondary | ICD-10-CM

## 2021-03-08 DIAGNOSIS — N289 Disorder of kidney and ureter, unspecified: Secondary | ICD-10-CM

## 2021-03-08 DIAGNOSIS — Z789 Other specified health status: Secondary | ICD-10-CM

## 2021-03-08 DIAGNOSIS — E785 Hyperlipidemia, unspecified: Secondary | ICD-10-CM | POA: Diagnosis not present

## 2021-03-08 DIAGNOSIS — R300 Dysuria: Secondary | ICD-10-CM

## 2021-03-08 DIAGNOSIS — Z0000 Encounter for general adult medical examination without abnormal findings: Secondary | ICD-10-CM | POA: Insufficient documentation

## 2021-03-08 DIAGNOSIS — R739 Hyperglycemia, unspecified: Secondary | ICD-10-CM

## 2021-03-08 DIAGNOSIS — I10 Essential (primary) hypertension: Secondary | ICD-10-CM | POA: Diagnosis not present

## 2021-03-08 DIAGNOSIS — M810 Age-related osteoporosis without current pathological fracture: Secondary | ICD-10-CM

## 2021-03-08 DIAGNOSIS — F32A Depression, unspecified: Secondary | ICD-10-CM

## 2021-03-08 DIAGNOSIS — Z1231 Encounter for screening mammogram for malignant neoplasm of breast: Secondary | ICD-10-CM

## 2021-03-08 HISTORY — DX: Hyperglycemia, unspecified: R73.9

## 2021-03-08 HISTORY — DX: Deficiency of other specified B group vitamins: E53.8

## 2021-03-08 LAB — CBC WITH DIFFERENTIAL/PLATELET
Basophils Absolute: 0.1 10*3/uL (ref 0.0–0.1)
Basophils Relative: 0.7 % (ref 0.0–3.0)
Eosinophils Absolute: 0.2 10*3/uL (ref 0.0–0.7)
Eosinophils Relative: 1.6 % (ref 0.0–5.0)
HCT: 37.6 % (ref 36.0–46.0)
Hemoglobin: 12 g/dL (ref 12.0–15.0)
Lymphocytes Relative: 15.2 % (ref 12.0–46.0)
Lymphs Abs: 1.5 10*3/uL (ref 0.7–4.0)
MCHC: 31.8 g/dL (ref 30.0–36.0)
MCV: 93.1 fl (ref 78.0–100.0)
Monocytes Absolute: 0.8 10*3/uL (ref 0.1–1.0)
Monocytes Relative: 8 % (ref 3.0–12.0)
Neutro Abs: 7.3 10*3/uL (ref 1.4–7.7)
Neutrophils Relative %: 74.5 % (ref 43.0–77.0)
Platelets: 297 10*3/uL (ref 150.0–400.0)
RBC: 4.04 Mil/uL (ref 3.87–5.11)
RDW: 15.8 % — ABNORMAL HIGH (ref 11.5–15.5)
WBC: 9.8 10*3/uL (ref 4.0–10.5)

## 2021-03-08 LAB — COMPREHENSIVE METABOLIC PANEL
ALT: 9 U/L (ref 0–35)
AST: 12 U/L (ref 0–37)
Albumin: 3.6 g/dL (ref 3.5–5.2)
Alkaline Phosphatase: 113 U/L (ref 39–117)
BUN: 16 mg/dL (ref 6–23)
CO2: 29 mEq/L (ref 19–32)
Calcium: 8.7 mg/dL (ref 8.4–10.5)
Chloride: 104 mEq/L (ref 96–112)
Creatinine, Ser: 0.99 mg/dL (ref 0.40–1.20)
GFR: 55.71 mL/min — ABNORMAL LOW (ref >60.00)
Glucose, Bld: 87 mg/dL (ref 70–99)
Potassium: 4.7 mEq/L (ref 3.5–5.1)
Sodium: 139 mEq/L (ref 135–145)
Total Bilirubin: 0.4 mg/dL (ref 0.2–1.2)
Total Protein: 6.2 g/dL (ref 6.0–8.3)

## 2021-03-08 LAB — URINALYSIS
Bilirubin Urine: NEGATIVE
Hgb urine dipstick: NEGATIVE
Leukocytes,Ua: NEGATIVE
Nitrite: NEGATIVE
Specific Gravity, Urine: 1.025 (ref 1.000–1.030)
Total Protein, Urine: NEGATIVE
Urine Glucose: NEGATIVE
Urobilinogen, UA: 1 (ref 0.0–1.0)
pH: 6 (ref 5.0–8.0)

## 2021-03-08 LAB — LIPID PANEL
Cholesterol: 132 mg/dL (ref 0–200)
HDL: 46.5 mg/dL (ref >39.00)
LDL Cholesterol: 63 mg/dL (ref 0–99)
NonHDL: 85.27
Total CHOL/HDL Ratio: 3
Triglycerides: 112 mg/dL (ref 0.0–149.0)
VLDL: 22.4 mg/dL (ref 0.0–40.0)

## 2021-03-08 LAB — TSH: TSH: 3.67 u[IU]/mL (ref 0.35–4.50)

## 2021-03-08 LAB — HEMOGLOBIN A1C: Hgb A1c MFr Bld: 5.8 % (ref 4.6–6.5)

## 2021-03-08 LAB — VITAMIN B12: Vitamin B-12: 829 pg/mL (ref 211–911)

## 2021-03-08 NOTE — Patient Instructions (Addendum)
[employer] they have their own website, Paediatric nurse Health   Mylanta for reflux and chest pain  Need appts with Gastroenterology, cardiology and urology Add Benefiber twice a day with a probiotic 60-80 ounces of water NOW company probiotic and more    Preventive Care 76 Years and Older, Female Preventive care refers to lifestyle choices and visits with your health care provider that can promote health and wellness. This includes:  A yearly physical exam. This is also called an annual wellness visit.  Regular dental and eye exams.  Immunizations.  Screening for certain conditions.  Healthy lifestyle choices, such as: ? Eating a healthy diet. ? Getting regular exercise. ? Not using drugs or products that contain nicotine and tobacco. ? Limiting alcohol use. What can I expect for my preventive care visit? Physical exam Your health care provider will check your:  Height and weight. These may be used to calculate your BMI (body mass index). BMI is a measurement that tells if you are at a healthy weight.  Heart rate and blood pressure.  Body temperature.  Skin for abnormal spots. Counseling Your health care provider may ask you questions about your:  Past medical problems.  Family's medical history.  Alcohol, tobacco, and drug use.  Emotional well-being.  Home life and relationship well-being.  Sexual activity.  Diet, exercise, and sleep habits.  History of falls.  Memory and ability to understand (cognition).  Work and work Astronomer.  Pregnancy and menstrual history.  Access to firearms. What immunizations do I need? Vaccines are usually given at various ages, according to a schedule. Your health care provider will recommend vaccines for you based on your age, medical history, and lifestyle or other factors, such as travel or where you work.   What tests do I need? Blood tests  Lipid and cholesterol levels. These may be  checked every 5 years, or more often depending on your overall health.  Hepatitis C test.  Hepatitis B test. Screening  Lung cancer screening. You may have this screening every year starting at age 76 if you have a 30-pack-year history of smoking and currently smoke or have quit within the past 15 years.  Colorectal cancer screening. ? All adults should have this screening starting at age 76 and continuing until age 76. ? Your health care provider may recommend screening at age 76 if you are at increased risk. ? You will have tests every 1-10 years, depending on your results and the type of screening test.  Diabetes screening. ? This is done by checking your blood sugar (glucose) after you have not eaten for a while (fasting). ? You may have this done every 1-3 years.  Mammogram. ? This may be done every 1-2 years. ? Talk with your health care provider about how often you should have regular mammograms.  Abdominal aortic aneurysm (AAA) screening. You may need this if you are a current or former smoker.  BRCA-related cancer screening. This may be done if you have a family history of breast, ovarian, tubal, or peritoneal cancers. Other tests  STD (sexually transmitted disease) testing, if you are at risk.  Bone density scan. This is done to screen for osteoporosis. You may have this done starting at age 76. Talk with your health care provider about your test results, treatment options, and if necessary, the need for more tests. Follow these instructions at home: Eating and drinking  Eat a diet that includes fresh fruits and vegetables, whole grains,  lean protein, and low-fat dairy products. Limit your intake of foods with high amounts of sugar, saturated fats, and salt.  Take vitamin and mineral supplements as recommended by your health care provider.  Do not drink alcohol if your health care provider tells you not to drink.  If you drink alcohol: ? Limit how much you have to  0-1 drink a day. ? Be aware of how much alcohol is in your drink. In the [place] one drink equals one 12 oz bottle of beer (355 mL), one 5 oz glass of wine (148 mL), or one 1 oz glass of hard liquor (44 mL).   Lifestyle  Take daily care of your teeth and gums. Brush your teeth every morning and night with fluoride toothpaste. Floss one time each day.  Stay active. Exercise for at least 30 minutes 5 or more days each week.  Do not use any products that contain nicotine or tobacco, such as cigarettes, e-cigarettes, and chewing tobacco. If you need help quitting, ask your health care provider.  Do not use drugs.  If you are sexually active, practice safe sex. Use a condom or other form of protection in order to prevent STIs (sexually transmitted infections).  Talk with your health care provider about taking a low-dose aspirin or statin.  Find healthy ways to cope with stress, such as: ? Meditation, yoga, or listening to music. ? Journaling. ? Talking to a trusted person. ? Spending time with friends and family. Safety  Always wear your seat belt while driving or riding in a vehicle.  Do not drive: ? If you have been drinking alcohol. Do not ride with someone who has been drinking. ? When you are tired or distracted. ? While texting.  Wear a helmet and other protective equipment during sports activities.  If you have firearms in your house, make sure you follow all gun safety procedures. What's next?  Visit your health care provider once a year for an annual wellness visit.  Ask your health care provider how often you should have your eyes and teeth checked.  Stay up to date on all vaccines. This information is not intended to replace advice given to you by your health care provider. Make sure you discuss any questions you have with your health care provider. Document Revised: 12/01/2020 Document Reviewed: 12/05/2018 Elsevier Patient Education  2021 [place].  Seasonal  Affective Disorder Seasonal affective disorder (SAD) is a type of depression. It is when you feel depressed at specific times of the year. SAD is most common during late fall and winter when the days are shorter and most people spend less time outdoors. This is why SAD is also known as the "winter blues." SAD occurs less commonly in the spring or summer. SAD can be mild to severe, and it can interfere with work, school, relationships, and normal daily activities. What are the causes? The cause of this condition is not known. It may be related to changes in brain chemistry that are caused by having less exposure to daylight. What increases the risk? You are more likely to develop this condition if:  You are female.  You live far [place] or far [place] of the equ[place]. These areas get less sunlight and have longer winter seasons.  You have a personal history of depression or bipolar disorder.  You have a family history of mental health conditions. What are the signs or symptoms? Symptoms of this condition include:  Depressed mood, which may involve: ? Feeling  sad or teary. ? Having crying spells.  Irritability.  Trouble sleeping, or sleeping more than usual.  Loss of interest in activities that you usually enjoy.  Feelings of guilt or worthlessness.  Restlessness or loss of energy.  Difficulty concentrating, remembering, or making decisions.  Significant change in appetite or weight.  Thinking about self-harm or attempting suicide. Symptoms associated with the winter pattern of SAD include:  Overeating or craving sweet foods.  Weight gain.  Avoiding social situations (social withdrawal), or feeling like "hibernating."  Sleeping more than usual. Symptoms associated with the less common summer pattern of SAD include:  Loss of appetite.  Weight loss.  Trouble sleeping.  Episodes of violent behavior (in severe cases). How is this diagnosed? This condition is usually  diagnosed through an assessment with your health care provider. You will be asked about your moods, thoughts, and behaviors. You will also be asked about your medical history, any major life changes, and any medicines and substances that you use. You may have a physical exam and blood tests to rule out other possible causes of your symptoms. You may be referred to a mental health specialist for more evaluation. How is this treated? Treatment for this condition may include:  Light therapy. This therapy involves sitting in front of a light source for 15-30 minutes every day. The light source may be: ? A light box. ? A dawn simulator or sunrise clock. This is a timer-activated light source that copies the sunrise by slowly becoming brighter. This can help to activate your body's internal clock.  Antidepressant medicine.  Cognitive behavioral therapy (CBT). CBT is a form of talk therapy that helps to identify and change negative thoughts that are associated with SAD.  Changes to your dietary, exercise, or sleeping habits. A healthy lifestyle may help to prevent or relieve symptoms.   Follow these instructions at home: Medicines  Take over-the-counter and prescription medicines only as told by your healthcare provider.  If you are taking antidepressant medicines, ask your health care provider what side effects you should be aware of.  Talk with your health care provider before you start taking any new prescription or over-the-counter medicines, herbs, or supplements. Lifestyle  Eat a healthy diet that includes fruits and vegetables, whole grains, and lean proteins.  Get plenty of sleep. To improve your sleep, make sure you: ? Keep your bedroom dark and cool. ? Go to sleep and wake up at about the same time every day. ? Do not keep screens (such as a TV or smartphone) in your bedroom. Limit your screen time starting a few hours before bedtime.  Exercise regularly.  Limit alcohol and  caffeine as told by your health care provider. General instructions  Make your home and work environment as sunny or bright as possible. Open window blinds and move furniture closer to windows.  Spend as much time outside as possible.  Use light therapy for 15-30 minutes every day, or as often as directed.  Attend CBT therapy sessions as directed.  Keep all follow-up visits as told by your health care provider and therapist. This is important. Contact a health care provider if:  Your symptoms do not get better or they get worse.  You have trouble taking care of yourself.  You are using drugs or alcohol to cope with your symptoms.  You have side effects from medicines. Get help right away if:  You have thoughts about hurting yourself or others. If you ever feel like you may  hurt yourself or others, or have thoughts about taking your own life, get help right away. You can go to your nearest emergency department or call:  Your local emergency services (911 in the [place]  A suicide crisis helpline, such as the [place] at (226)552-8535. This is open 24 hours a day. Summary  Seasonal affective disorder (SAD) is a type of depression that is associated with specific times of the year (usually fall and winter).  This condition may be treated with light therapy, talk therapy, and antidepressant medicines.  To help treat your condition, take good care of yourself and make home and work as sunny and bright as possible.  Seek help right away if you have thoughts about hurting yourself or others. This information is not intended to replace advice given to you by your health care provider. Make sure you discuss any questions you have with your health care provider. Document Revised: 06/03/2020 Document Reviewed: 06/03/2020 Elsevier Patient Education  [place].

## 2021-03-08 NOTE — Assessment & Plan Note (Signed)
Encouraged to get adequate exercise, calcium and vitamin d intake 

## 2021-03-08 NOTE — Assessment & Plan Note (Signed)
Hydrate and monitor 

## 2021-03-08 NOTE — Assessment & Plan Note (Signed)
[id number] acceptable, minimize simple carbs. Increase exercise as tolerated.  

## 2021-03-08 NOTE — Assessment & Plan Note (Signed)
Encouraged heart healthy diet, increase exercise, avoid trans fats, consider a krill oil cap daily 

## 2021-03-08 NOTE — Assessment & Plan Note (Signed)
Unable to tolerate statins 

## 2021-03-08 NOTE — Assessment & Plan Note (Signed)
Well controlled, no changes to meds. Encouraged heart healthy diet such as the DASH diet and exercise as tolerated.  °

## 2021-03-08 NOTE — Assessment & Plan Note (Signed)
Deficiency being treated at [facility] with neurology. On oral Vitamin B12 1000 mcg daily

## 2021-03-08 NOTE — Progress Notes (Signed)
Patient ID: [name], female    DOB: 11-Oct-1946  Age: 75 y.o. MRN: [id number]    Subjective:  Subjective  HPI [name] presents for comprehensive physical exam today. She reports that her neuropathy is still bothering her, and she hasn't been able to solve this issue yet. She plans to receive a rollator walker to help with her symptoms. She states that she has been managing well for past 6 months. Her neurologist completed blood work and found neuropathy in both her arms and legs. The patient has a PMHX of fibromyalgia and neuropathy. She reports that her mental health has declined secondary to her fibromyalgia. However, she is hopeful for better days. She has a B12 def, she was given instructions to take 1 tablet of B12 supplements daily.   She complains of L ear pain that has progressively worsened. She explains that changing her head positions makes her hear bubbles forming in her left ear. She is requesting it be checked.   She endorses having bowel incontinence that has worsened. She states that she has 4-5 blow outs  a day and has stopped her from participating in activities(going to church). She has to wear a pad daily. She denies seeing any blood in stool. She aslo complains of having urination burning. She is also complains of burning chest, and lying down worsens the pain. She develops sweats, palpitations, and chest pain with this symptom at night. She was taking omeprazole but she ran out. She denies any  abdominal pain, fever, HA, SOB, or chills at this time.  She reports that she loss her close cousin last year and she miss her dearly. She is manage her mood but it is affecting her mental health.   Review of Systems  Constitutional: Negative for chills, fatigue and fever.       (+)sweats  HENT: Positive for ear pain (left). Negative for congestion, rhinorrhea, sinus pain and sore throat.   Eyes: Negative for pain.  Respiratory: Negative for cough and shortness of breath.    Cardiovascular: Positive for chest pain (chest burning) and palpitations. Negative for leg swelling.  Gastrointestinal: Negative for abdominal pain, blood in stool, nausea and vomiting.       (+)Loose stools (+)Bowel incontinence   Genitourinary: Positive for dysuria (burning with urination). Negative for hematuria, urgency, vaginal bleeding, vaginal discharge and vaginal pain.  Musculoskeletal: Positive for myalgias. Negative for back pain.  Skin: Negative for rash.  Allergic/Immunologic: Negative for environmental allergies.  Neurological: Negative for dizziness and headaches.  Psychiatric/Behavioral:       (+)Depression    History Past Medical History:  Diagnosis Date  . Allergy   . Anemia   . Anxiety    past hx   . Arthritis   . Asthma   . Atrial flutter ([place])    past history- not current  . CAD (coronary artery disease) CARDIOLOGIST--  DR Angelena Form   mild non-obstructive cad  . Cancer ([place])    right  . Cataract    bilaterally removed   . Chronic constipation   . Chronic kidney disease    interstitial cystitis  . Concussion    x 3  . COPD (chronic obstructive pulmonary disease) ([place])   . Depression    past hx   . Family history of malignant hyperthermia    father had this  . Fibromyalgia   . Frequency of urination   . GERD (gastroesophageal reflux disease)   . H/O hiatal hernia   .  History of basal cell carcinoma excision    X2  . History of breast cancer ONCOLOGIST-- DR Jana Hakim---  NO RECURRANCE   DX 07/2012;  LOW GRADE DCIS  ER+PR+  ----  S/P RIGHT LUMPECTOMY WITH NEGATIVE MARGINS/   RADIATION ENDED 11/2012  . History of chronic bronchitis   . History of colonic polyps   . History of tachycardia    CONTROLLED  WITH ATENOLOL  . Hyperlipidemia   . Hypertension   . Neuromuscular disorder ([facility])    fibromyalgia  . Neuropathy   . Osteoporosis 01/2019   T score -2.2 stable/improved from prior study  . Pelvic pain   . Personal history of radiation therapy    . S/P radiation therapy 11/12/12 - 12/05/12   right Breast  . Sepsis ([place]) 2014   from UTI   . Sinus headache   . Urgency of urination     She has a past surgical history that includes Nasal sinus surgery (1985); Cardiovascular stress test (06-18-2012  DR Angelena Form); transthoracic echocardiogram (06-24-2012); RIGHT BREAST BX (08-23-2012); REMOVAL VOCAL CORD CYST (FEB 2014); Cardiac catheterization (09-13-2007  DR Lia Foyer); Cardiac catheterization (08-04-2010  [facility]); Colonoscopy (09-29-2010); Total abdominal hysterectomy w/ bilateral salpingoophorectomy (1982); Tonsillectomy and adenoidectomy (AGE 56); ORIF RIGHT ANKLE  FX (2006); RIGHT HAND SURGERY (X3  LAST ONE 2009); Cystoscopy with hydrodistension and biopsy (N/A, 03/06/2014); Esophageal manometry (N/A, 04/03/2016); 24 hour ph study (N/A, 04/03/2016); Cystoscopy; Polypectomy; Upper gastrointestinal endoscopy; Extracorporeal shock wave lithotripsy (Left, 02/06/2019); Skin cancer excision; and Breast lumpectomy (Right, 10-11-2012).   Her family history includes Allergic Disorder in her daughter and daughter; Arthritis in her father and paternal uncle; Birth defects in her maternal aunt; Breast cancer in her maternal aunt, paternal aunt, and paternal aunt; Breast cancer (age of onset: 36) in her mother; Colon cancer in her father and mother; Colon polyps in her father; Diabetes in her daughter, father, and mother; Heart disease in her cousin, father, and son; Hyperlipidemia in her father; Hypertension in her father; Irritable bowel syndrome in her son; Pancreatic cancer in her mother; Rectal cancer in her mother; Stroke in her father.She reports that she quit smoking about 15 years ago. Her smoking use included cigarettes. She has a 15.00 pack-year smoking history. She has never used smokeless tobacco. She reports that she does not drink alcohol and does not use drugs.  Current Outpatient Medications on File Prior to Visit  Medication Sig Dispense  Refill  . acetaminophen (TYLENOL) 500 MG tablet Take 1,000 mg by mouth every 6 (six) hours as needed for mild pain or headache.     . albuterol (PROVENTIL HFA;VENTOLIN HFA) 108 (90 BASE) MCG/ACT inhaler Inhale 2 puffs into the lungs every 4 (four) hours as needed. 1 Inhaler 5  . amLODipine (NORVASC) 10 MG tablet Take 1 tablet (10 mg total) by mouth daily. 90 tablet 3  . aspirin EC 81 MG tablet Take 1 tablet (81 mg total) by mouth daily. 90 tablet 3  . atenolol (TENORMIN) 50 MG tablet Take 1 tablet by mouth twice daily 180 tablet 1  . Cyanocobalamin (VITAMIN B-12) 5000 MCG TBDP Take 1 tablet by mouth daily with breakfast.    . denosumab (PROLIA) 60 MG/ML SOSY injection Inject 60 mg into the skin every 6 (six) months.    . dicyclomine (BENTYL) 10 MG capsule Take 1 capsule (10 mg total) by mouth every 8 (eight) hours. (Patient taking differently: Take 10 mg by mouth every other day.) 90 capsule 1  .  DULoxetine (CYMBALTA) 60 MG capsule Take 2 capsules (120 mg total) by mouth daily. 180 capsule 1  . Estradiol 10 MCG TABS vaginal tablet Place 1 tablet (10 mcg total) vaginally 2 (two) times a week. 24 tablet 4  . Evolocumab (REPATHA SURECLICK) 124 MG/ML SOAJ Inject 140 mg into the skin every 14 (fourteen) days. 1 mL 0  . fluticasone (FLONASE) 50 MCG/ACT nasal spray Use 2 spray(s) in each nostril once daily 16 g 5  . furosemide (LASIX) 20 MG tablet Take 1 tablet (20 mg total) by mouth as needed for edema. 90 tablet 3  . gabapentin (NEURONTIN) 300 MG capsule Take 300 mg by mouth. 2cap in the morning, 2cap at noon, and 3caps at bedtime    . hyoscyamine (LEVSIN SL) 0.125 MG SL tablet Place 1 tablet (0.125 mg total) under the tongue every 4 (four) hours as needed. 30 tablet 0  . loratadine (CLARITIN) 10 MG tablet Take 1 tablet (10 mg total) by mouth daily. 30 tablet 11  . losartan (COZAAR) 100 MG tablet Take 1 tablet by mouth once daily 90 tablet 3  . mupirocin ointment (BACTROBAN) 2 % Apply thin film twice  daily. 22 g 0  . nitroGLYCERIN (NITROSTAT) 0.4 MG SL tablet Place 1 tablet (0.4 mg total) under the tongue every 5 (five) minutes as needed for chest pain. 25 tablet 1  . pentosan polysulfate (ELMIRON) 100 MG capsule Take 1 capsule (100 mg total) by mouth 3 (three) times daily. Reported on 01/05/2016 (Patient taking differently: Take 100 mg by mouth 3 (three) times daily with meals as needed. Reported on 01/05/2016) 90 capsule 11  . polyethylene glycol (MIRALAX) 17 g packet Take 17 g by mouth daily. 14 each 0  . pramipexole (MIRAPEX) 0.125 MG tablet Take 1-2 tablets (0.125-0.25 mg total) by mouth in the morning and at bedtime. 60 tablet 5  . tiZANidine (ZANAFLEX) 2 MG tablet Take 0.5-2 tablets (1-4 mg total) by mouth every 8 (eight) hours as needed for muscle spasms. 40 tablet 1  . traMADol (ULTRAM) 50 MG tablet Take 50-100 mg by mouth 2 (two) times daily as needed.    . traZODone (DESYREL) 100 MG tablet Take 1 tablet (100 mg total) by mouth at bedtime. 90 tablet 1  . pantoprazole (PROTONIX) 40 MG tablet Take 1 tablet (40 mg total) by mouth 2 (two) times daily. 60 tablet 0   Current Facility-Administered Medications on File Prior to Visit  Medication Dose Route Frequency Provider Last Rate Last Admin  . bupivacaine (MARCAINE) 0.5 % 10 mL, triamcinolone acetonide (KENALOG-40) 40 mg injection   Subcutaneous Once Carolan Clines, MD      . bupivacaine (MARCAINE) 0.5 % 15 mL, phenazopyridine (PYRIDIUM) 400 mg bladder mixture   Bladder Instillation Once Carolan Clines, MD         Objective:  Objective  Physical Exam Vitals and nursing note reviewed.  Constitutional:      General: She is not in acute distress.    Appearance: Normal appearance. She is well-developed. She is not ill-appearing.  HENT:     Head: Normocephalic and atraumatic.     Right Ear: Tympanic membrane, ear canal and external ear normal.     Left Ear: External ear normal.     Ears:     Comments: Left TM appears cloudy     Nose: Nose normal.  Eyes:     General:        Right eye: No discharge.  Left eye: No discharge.     Extraocular Movements: Extraocular movements intact.     Conjunctiva/sclera: Conjunctivae normal.     Pupils: Pupils are equal, round, and reactive to light.  Neck:     Thyroid: No thyromegaly.     Vascular: No JVD.  Cardiovascular:     Rate and Rhythm: Normal rate and regular rhythm.     Pulses: Normal pulses.     Heart sounds: Normal heart sounds. No murmur heard.   Pulmonary:     Effort: Pulmonary effort is normal. No respiratory distress.     Breath sounds: Normal breath sounds. No wheezing or rales.  Chest:     Chest wall: No tenderness.  Abdominal:     General: Bowel sounds are normal. There is no distension.     Palpations: Abdomen is soft. There is no mass.     Tenderness: There is no abdominal tenderness. There is no guarding or rebound.  Genitourinary:    Vagina: Normal.  Musculoskeletal:        General: No tenderness. Normal range of motion.     Cervical back: Normal range of motion and neck supple.     Comments: 5/5 strength in bilateral UE and LE   Skin:    General: Skin is warm and dry.     Findings: No erythema or rash.  Neurological:     Mental Status: She is alert and oriented to person, place, and time.     Cranial Nerves: No cranial nerve deficit.     Deep Tendon Reflexes: Reflexes are normal and symmetric.  Psychiatric:        Behavior: Behavior normal.    BP 116/64   Pulse 85   Temp 98.2 F (36.8 C)   Resp 16   Ht 5\' 3"  (1.6 m)   Wt 139 lb 3.2 oz (63.1 kg)   SpO2 94%   BMI 24.66 kg/m  Wt Readings from Last 3 Encounters:  03/08/21 139 lb 3.2 oz (63.1 kg)  02/22/21 139 lb (63 kg)  11/10/20 135 lb 6 oz (61.4 kg)     Lab Results  Component Value Date   WBC 9.8 03/08/2021   HGB 12.0 03/08/2021   HCT 37.6 03/08/2021   PLT 297.0 03/08/2021   GLUCOSE 87 03/08/2021   CHOL 132 03/08/2021   TRIG 112.0 03/08/2021   HDL 46.50  03/08/2021   LDLDIRECT 117.3 04/08/2012   LDLCALC 63 03/08/2021   ALT 9 03/08/2021   AST 12 03/08/2021   NA 139 03/08/2021   K 4.7 03/08/2021   CL 104 03/08/2021   CREATININE 0.99 03/08/2021   BUN 16 03/08/2021   CO2 29 03/08/2021   TSH 3.67 03/08/2021   INR 1.05 08/03/2010   HGBA1C 5.8 03/08/2021   MICROALBUR 0.2 07/10/2013    DG Lumbar Spine 2-3 Views  Result Date: 09/16/2020 CLINICAL DATA:  Low back pain. EXAM: LUMBAR SPINE - 2-3 VIEW COMPARISON:  CT abdomen pelvis 08/06/2020 FINDINGS: Five non-rib-bearing lumbar vertebral bodies. Levocurvature of the lumbar spine, centered at L2-L3. No substantial subluxation. No evidence of acute fracture. Moderate degenerative disc disease at L2-L3. Otherwise, mild degenerative disc disease. Moderate lower lumbar facet arthropathy. IMPRESSION: 1. No evidence of acute fracture. 2. Levocurvature of the lumbar spine. 3. Moderate degenerative disc disease at L2-L3. Electronically Signed   By: Margaretha Sheffield MD   On: 09/16/2020 15:27     Assessment & Plan:  Plan    No orders of the defined types were  placed in this encounter.   Problem List Items Addressed This Visit    Essential hypertension    Well controlled, no changes to meds. Encouraged heart healthy diet such as the DASH diet and exercise as tolerated.       Osteoporosis    Encouraged to get adequate exercise, calcium and vitamin d intake      Renal insufficiency    Hydrate and monitor      Hyperlipidemia    Encouraged heart healthy diet, increase exercise, avoid trans fats, consider a krill oil cap daily      Depression    Worsening but she declines psychiatry referral or counseling at this time. Continue current meds.      Statin intolerance    Unable to tolerate statins      Vitamin B12 deficiency    Deficiency being treated at [facility] with neurology. On oral Vitamin B12 1000 mcg daily      Preventative health care - Primary    Patient encouraged to maintain heart  healthy diet, regular exercise, adequate sleep. Consider daily probiotics.. Take medications as prescribed. Dexa scan due this year at [facility]. MGM April 2021, colonoscopy due this year      Relevant Orders   Hemoglobin A1c (Completed)   CBC with Differential/Platelet (Completed)   Comprehensive metabolic panel (Completed)   Vitamin B12 (Completed)   Vitamin D 1,25 dihydroxy   Lipid panel (Completed)   TSH (Completed)   Hyperglycemia    hgba1c acceptable, minimize simple carbs. Increase exercise as tolerated      Dysuria    Check UA and culture      Relevant Orders   Urinalysis (Completed)   Urine Culture (Completed)    PAP Smear- Last complete on 11/05/2018, results were normal, repeat in 2 years. She plans to receive this at [place] this year.  Mammo-Last completed on 04/20/2020, results were normal, repeat in 1 year  Dexa-Last completed on 02/04/2019, Osteoporsis was noted, Repeat in 2 years. She states that she plans to receive another scan this year at [place]   Colonoscopy- Last completed on 12/24/2018, Adenomatous polyps noted, repeat in 3 year  Follow-up: Return in about 3 months (around 06/08/2021).   I,Alexis Bryant,acting as a Education administrator for [name], MD.,have documented all relevant documentation on the behalf of [name], MD,as directed by  [name], MD while in the presence of [name], MD.  Medical screening examination/treatment was performed by qualified clinical staff member and as supervising physician I was immediately available for consultation/collaboration. I have reviewed documentation and agree with assessment and plan.  [name], MD

## 2021-03-08 NOTE — Assessment & Plan Note (Signed)
Worsening but she declines psychiatry referral or counseling at this time. Continue current meds.

## 2021-03-08 NOTE — Assessment & Plan Note (Signed)
Patient encouraged to maintain heart healthy diet, regular exercise, adequate sleep. Consider daily probiotics.. Take medications as prescribed. Dexa scan due this year at [facility]. MGM April 2021, colonoscopy due this year

## 2021-03-09 ENCOUNTER — Telehealth: Payer: Self-pay | Admitting: Family Medicine

## 2021-03-09 DIAGNOSIS — R300 Dysuria: Secondary | ICD-10-CM | POA: Insufficient documentation

## 2021-03-09 HISTORY — DX: Dysuria: R30.0

## 2021-03-09 NOTE — Telephone Encounter (Signed)
Pt would like her appt yesterday listed as a wellness visit for insurance.

## 2021-03-09 NOTE — Assessment & Plan Note (Signed)
Check UA and culture 

## 2021-03-10 NOTE — Telephone Encounter (Signed)
Hi again, also , if she needs to have the Medicare AWV, she can set up with [facility] in person or virtual.  Just a thought.  Thanks, [employer]

## 2021-03-10 NOTE — Telephone Encounter (Signed)
Hello,   I have removed the additional visit.  Thank you Dawn

## 2021-03-10 NOTE — Telephone Encounter (Signed)
So unfortunately an Annual wellness visit has very specific components to it and if we do not do even one step we cannot bill this so it is too late, I did submit an annual physical for insurance, that is separate and covered most of the time. Dawn I am willing to remove the [id number] is that possible?

## 2021-03-14 LAB — URINE CULTURE
MICRO NUMBER:: 11648861
Result:: NO GROWTH
SPECIMEN QUALITY:: ADEQUATE

## 2021-03-14 LAB — VITAMIN D 1,25 DIHYDROXY
Vitamin D 1, 25 (OH)2 Total: 104 pg/mL — ABNORMAL HIGH (ref 18–72)
Vitamin D2 1, 25 (OH)2: <8 pg/mL
Vitamin D3 1, 25 (OH)2: 104 pg/mL

## 2021-03-16 ENCOUNTER — Encounter: Payer: Self-pay | Admitting: Family Medicine

## 2021-03-16 NOTE — Progress Notes (Signed)
HPI: FU CAD.Cardiac catheterization in August 2011 showed a 40% proximal LAD, 30% mid LAD, 30% RCA and her ejection fraction was 60%. Nuclear study in June 2013 showed no ischemia with an ejection fraction of 68%. Nuclear study March 2016 showed ejection fraction 67%. Breast attenuation but no ischemia.MRA of the headOctober 2018 normal.Renal dopplers 1/19 showed no RAS.Echocardiogram repeated July 2020 and showed normal LV function. Since last seen  she has some dyspnea on exertion but no orthopnea, PND or syncope.  Occasional minimal pedal edema.  She occasionally has some chest pressure when lying down at night lasting approximately 1 minute.  She does not have exertional chest pain.   Current Outpatient Medications  Medication Sig Dispense Refill  . acetaminophen (TYLENOL) 500 MG tablet Take 1,000 mg by mouth every 6 (six) hours as needed for mild pain or headache.     . albuterol (PROVENTIL HFA;VENTOLIN HFA) 108 (90 BASE) MCG/ACT inhaler Inhale 2 puffs into the lungs every 4 (four) hours as needed. 1 Inhaler 5  . amLODipine (NORVASC) 10 MG tablet Take 1 tablet (10 mg total) by mouth daily. 90 tablet 3  . aspirin EC 81 MG tablet Take 1 tablet (81 mg total) by mouth daily. 90 tablet 3  . atenolol (TENORMIN) 50 MG tablet Take 1 tablet by mouth twice daily 180 tablet 1  . Cyanocobalamin (VITAMIN B-12) 5000 MCG TBDP Take 1 tablet by mouth daily with breakfast.    . denosumab (PROLIA) 60 MG/ML SOSY injection Inject 60 mg into the skin every 6 (six) months.    . DULoxetine (CYMBALTA) 60 MG capsule Take 2 capsules (120 mg total) by mouth daily. 180 capsule 1  . Estradiol 10 MCG TABS vaginal tablet Place 1 tablet (10 mcg total) vaginally 2 (two) times a week. 24 tablet 4  . Evolocumab (REPATHA SURECLICK) 025 MG/ML SOAJ Inject 140 mg into the skin every 14 (fourteen) days. 1 mL 0  . fluticasone (FLONASE) 50 MCG/ACT nasal spray Use 2 spray(s) in each nostril once daily 16 g 5  . furosemide  (LASIX) 20 MG tablet Take 1 tablet (20 mg total) by mouth as needed for edema. 90 tablet 3  . gabapentin (NEURONTIN) 300 MG capsule Take 300 mg by mouth. 2cap in the morning, 2cap at noon, and 3caps at bedtime    . hyoscyamine (LEVSIN SL) 0.125 MG SL tablet Place 1 tablet (0.125 mg total) under the tongue every 4 (four) hours as needed. 30 tablet 0  . loratadine (CLARITIN) 10 MG tablet Take 1 tablet (10 mg total) by mouth daily. 30 tablet 11  . losartan (COZAAR) 100 MG tablet Take 1 tablet by mouth once daily 90 tablet 3  . mupirocin ointment (BACTROBAN) 2 % Apply thin film twice daily. 22 g 0  . nitroGLYCERIN (NITROSTAT) 0.4 MG SL tablet Place 1 tablet (0.4 mg total) under the tongue every 5 (five) minutes as needed for chest pain. 25 tablet 1  . pentosan polysulfate (ELMIRON) 100 MG capsule Take 1 capsule (100 mg total) by mouth 3 (three) times daily. Reported on 01/05/2016 (Patient taking differently: Take 100 mg by mouth 3 (three) times daily with meals as needed. Reported on 01/05/2016) 90 capsule 11  . polyethylene glycol (MIRALAX) 17 g packet Take 17 g by mouth daily. 14 each 0  . pramipexole (MIRAPEX) 0.125 MG tablet Take 1-2 tablets (0.125-0.25 mg total) by mouth in the morning and at bedtime. 60 tablet 5  . traZODone (DESYREL) 100  MG tablet Take 1 tablet (100 mg total) by mouth at bedtime. 90 tablet 1   No current facility-administered medications for this visit.   Facility-Administered Medications Ordered in Other Visits  Medication Dose Route Frequency Provider Last Rate Last Admin  . bupivacaine (MARCAINE) 0.5 % 10 mL, triamcinolone acetonide (KENALOG-40) 40 mg injection   Subcutaneous Once Carolan Clines, MD      . bupivacaine (MARCAINE) 0.5 % 15 mL, phenazopyridine (PYRIDIUM) 400 mg bladder mixture   Bladder Instillation Once Carolan Clines, MD         Past Medical History:  Diagnosis Date  . Allergy   . Anemia   . Anxiety    past hx   . Arthritis   . Asthma   .  Atrial flutter ([place])    past history- not current  . CAD (coronary artery disease) CARDIOLOGIST--  DR Angelena Form   mild non-obstructive cad  . Cancer ([place])    right  . Cataract    bilaterally removed   . Chronic constipation   . Chronic kidney disease    interstitial cystitis  . Concussion    x 3  . COPD (chronic obstructive pulmonary disease) ([place])   . Depression    past hx   . Family history of malignant hyperthermia    father had this  . Fibromyalgia   . Frequency of urination   . GERD (gastroesophageal reflux disease)   . H/O hiatal hernia   . History of basal cell carcinoma excision    X2  . History of breast cancer ONCOLOGIST-- DR Jana Hakim---  NO RECURRANCE   DX 07/2012;  LOW GRADE DCIS  ER+PR+  ----  S/P RIGHT LUMPECTOMY WITH NEGATIVE MARGINS/   RADIATION ENDED 11/2012  . History of chronic bronchitis   . History of colonic polyps   . History of tachycardia    CONTROLLED  WITH ATENOLOL  . Hyperlipidemia   . Hypertension   . Neuromuscular disorder ([facility])    fibromyalgia  . Neuropathy   . Osteoporosis 01/2019   T score -2.2 stable/improved from prior study  . Pelvic pain   . Personal history of radiation therapy   . S/P radiation therapy 11/12/12 - 12/05/12   right Breast  . Sepsis ([place]) 2014   from UTI   . Sinus headache   . Urgency of urination     Past Surgical History:  Procedure Laterality Date  . [place] STUDY N/A 04/03/2016   Procedure: [place] STUDY;  Surgeon: Manus Gunning, MD;  Location: [facility];  Service: Gastroenterology;  Laterality: N/A;  . BREAST LUMPECTOMY Right 10-11-2012   W/ SLN BX  . CARDIAC CATHETERIZATION  09-13-2007  DR Lia Foyer   WELL-PRESERVED LVF/  DIFFUSE SCATTERED CORONARY CALCIFACATION AND ATHEROSCLEROSIS WITHOUT OBSTRUCTION  . CARDIAC CATHETERIZATION  08-04-2010  DR Angelena Form   NON-OBSTRUCTIVE CAD/  pLAD 40%/  oLAD 30%/  mLAD 30%/  pRCA 30%/  EF 60%  . CARDIOVASCULAR STRESS TEST  06-18-2012  DR McALHANY   LOW  RISK NUCLEAR STUDY/  SMALL FIXED AREA OF MODERATELY DECREASED UPTAKE IN ANTEROSEPTAL WALL WHICH MAY BE ARTIFACTUAL/  NO ISCHEMIA/  EF 68%  . COLONOSCOPY  09-29-2010  . CYSTOSCOPY    . CYSTOSCOPY WITH HYDRODISTENSION AND BIOPSY N/A 03/06/2014   Procedure: CYSTOSCOPY/HYDRODISTENSION/ INSTILATION OF MARCAINE AND PYRIDIUM;  Surgeon: Ailene Rud, MD;  Location: [facility];  Service: Urology;  Laterality: N/A;  . ESOPHAGEAL MANOMETRY N/A 04/03/2016   Procedure: ESOPHAGEAL  MANOMETRY (EM);  Surgeon: Manus Gunning, MD;  Location: [facility];  Service: Gastroenterology;  Laterality: N/A;  . EXTRACORPOREAL SHOCK WAVE LITHOTRIPSY Left 02/06/2019   Procedure: EXTRACORPOREAL SHOCK WAVE LITHOTRIPSY (ESWL);  Surgeon: Kathie Rhodes, MD;  Location: [facility];  Service: Urology;  Laterality: Left;  . NASAL SINUS SURGERY  1985  . ORIF RIGHT ANKLE  FX  2006  . POLYPECTOMY    . REMOVAL VOCAL CORD CYST  FEB 2014  . RIGHT BREAST BX  08-23-2012  . RIGHT HAND SURGERY  X3  LAST ONE 2009   INCLUDES  ORIF RIGHT 5TH FINGER AND REVISION TWICE  . SKIN CANCER EXCISION    . TONSILLECTOMY AND ADENOIDECTOMY  AGE 30  . TOTAL ABDOMINAL HYSTERECTOMY W/ BILATERAL SALPINGOOPHORECTOMY  1982   W/  APPENDECTOMY  . TRANSTHORACIC ECHOCARDIOGRAM  06-24-2012   GRADE I DIASTOLIC DYSFUNCTION/  EF 55-60%/  MILD MR  . UPPER GASTROINTESTINAL ENDOSCOPY      Social History   Socioeconomic History  . Marital status: Married    Spouse name: Jori Moll   . Number of children: 3  . Years of education: BA  . Highest education level: Not on file  Occupational History  . Occupation: Retired Programmer, multimedia: RETIRED  Tobacco Use  . Smoking status: Former Smoker    Packs/day: 1.00    Years: 15.00    Pack years: 15.00    Types: Cigarettes    Quit date: 05/27/2005    Years since quitting: 15.8  . Smokeless tobacco: Never Used  Vaping Use  . Vaping Use: Never used  Substance and Sexual Activity  . Alcohol use: No     Alcohol/week: 0.0 standard drinks  . Drug use: No  . Sexual activity: Yes    Partners: Male    Comment: 1st intercourse 75 yo-Fewer than 5 partners  Other Topics Concern  . Not on file  Social History Narrative   Lives with husband.   Caffeine use: 1/2 cup per day   Exercise-- 2days a week [employer],  Water aerobics, walking       Retired Marine scientist   No dietary restrictions, tries to maintain a heart healthy diet   Social Determinants of Radio broadcast assistant Strain: Not on file  Food Insecurity: Not on file  Transportation Needs: Not on file  Physical Activity: Not on file  Stress: Not on file  Social Connections: Not on file  Intimate Partner Violence: Not on file    Family History  Problem Relation Age of Onset  . Rectal cancer Mother   . Colon cancer Mother   . Pancreatic cancer Mother   . Diabetes Mother   . Breast cancer Mother 62  . Breast cancer Maternal Aunt        breast  . Birth defects Maternal Aunt   . Irritable bowel syndrome Son   . Heart disease Son        CAD, MV replacement  . Allergic Disorder Daughter   . Diabetes Daughter   . Colon cancer Father   . Colon polyps Father   . Diabetes Father   . Stroke Father   . Heart disease Father        CHF  . Hyperlipidemia Father   . Hypertension Father   . Arthritis Father   . Breast cancer Paternal Aunt   . Breast cancer Paternal Aunt   . Arthritis Paternal Uncle   . Allergic Disorder Daughter   . Heart disease  Cousin        CAD, had a blood clot after stenting  . Esophageal cancer Neg Hx   . Stomach cancer Neg Hx     ROS: no fevers or chills, productive cough, hemoptysis, dysphasia, odynophagia, melena, hematochezia, dysuria, hematuria, rash, seizure activity, orthopnea, PND, pedal edema, claudication. Remaining systems are negative.  Physical Exam: Well-developed well-nourished in no acute distress.  Skin is warm and dry.  HEENT is normal.  Neck is supple.  Chest is clear to auscultation  with normal expansion.  Cardiovascular exam is regular rate and rhythm.  Abdominal exam nontender or distended. No masses palpated. Extremities show no edema. neuro grossly intact  ECG-normal sinus rhythm, no ST changes.  Personally reviewed  A/P  1 coronary artery disease-patient denies chest pain.  Continue aspirin.  Intolerant to statins.  2 hyperlipidemia-intolerant to statins.  Continue Repatha.  3 hypertension-blood pressure controlled.  Continue present medications and follow.  Kirk Ruths, MD

## 2021-03-22 ENCOUNTER — Ambulatory Visit: Payer: Medicare Other | Admitting: *Deleted

## 2021-03-23 ENCOUNTER — Encounter: Payer: Self-pay | Admitting: Cardiology

## 2021-03-23 ENCOUNTER — Ambulatory Visit (INDEPENDENT_AMBULATORY_CARE_PROVIDER_SITE_OTHER): Payer: Medicare Other | Admitting: Cardiology

## 2021-03-23 ENCOUNTER — Other Ambulatory Visit: Payer: Self-pay

## 2021-03-23 VITALS — BP 125/79 | HR 74 | Ht 64.0 in | Wt 139.1 lb

## 2021-03-23 DIAGNOSIS — I2510 Atherosclerotic heart disease of native coronary artery without angina pectoris: Secondary | ICD-10-CM | POA: Diagnosis not present

## 2021-03-23 DIAGNOSIS — E785 Hyperlipidemia, unspecified: Secondary | ICD-10-CM

## 2021-03-23 DIAGNOSIS — I10 Essential (primary) hypertension: Secondary | ICD-10-CM

## 2021-03-23 NOTE — Patient Instructions (Signed)

## 2021-04-06 ENCOUNTER — Other Ambulatory Visit: Payer: Self-pay

## 2021-04-06 ENCOUNTER — Other Ambulatory Visit: Payer: Self-pay | Admitting: Obstetrics & Gynecology

## 2021-04-06 ENCOUNTER — Ambulatory Visit (INDEPENDENT_AMBULATORY_CARE_PROVIDER_SITE_OTHER): Payer: Medicare Other

## 2021-04-06 DIAGNOSIS — M8589 Other specified disorders of bone density and structure, multiple sites: Secondary | ICD-10-CM

## 2021-04-06 DIAGNOSIS — Z780 Asymptomatic menopausal state: Secondary | ICD-10-CM

## 2021-04-06 DIAGNOSIS — M810 Age-related osteoporosis without current pathological fracture: Secondary | ICD-10-CM

## 2021-04-25 ENCOUNTER — Ambulatory Visit: Payer: Medicare Other | Admitting: Gastroenterology

## 2021-04-25 ENCOUNTER — Encounter: Payer: Self-pay | Admitting: Gastroenterology

## 2021-04-25 ENCOUNTER — Other Ambulatory Visit: Payer: Self-pay

## 2021-04-25 VITALS — BP 118/74 | HR 66 | Ht 63.5 in | Wt 137.0 lb

## 2021-04-25 DIAGNOSIS — K219 Gastro-esophageal reflux disease without esophagitis: Secondary | ICD-10-CM | POA: Diagnosis not present

## 2021-04-25 DIAGNOSIS — R1310 Dysphagia, unspecified: Secondary | ICD-10-CM | POA: Diagnosis not present

## 2021-04-25 DIAGNOSIS — R194 Change in bowel habit: Secondary | ICD-10-CM

## 2021-04-25 MED ORDER — FAMOTIDINE 20 MG PO TABS: 20.0000 mg | ORAL_TABLET | Freq: Two times a day (BID) | ORAL | 3 refills | Status: DC

## 2021-04-25 NOTE — Progress Notes (Signed)
HPI :  75 year old female, here for a follow-up visit for altered bowel habits, GERD, dysphagia  See prior clinic visits for details of her case.  She has had some intermittent loose stools in the past as well as some abdominal discomfort.  CT scan last year did not show any concerning problems, she tested negative for celiac disease.  She was taking Bentyl for some period of time which helped with her pain but caused additional problems with loose stools.  She states she had changed her diet and loose stools had improved, then she became constipated.  We treated that and then over time her loose stools have come back in recent months.  She states she was having frequent loose stools with urgency.  I had previously recommended a low FODMAP diet but she never tried it.  She tried that diet and cut out dairy and gluten and has been feeling much better in general.  Her diarrhea has completely resolved.  She has a bowel movement once daily to every other day states her stomach is significantly improved at this time.  She is been on her dietary change for 6 weeks and asked about long-term plan in this regard.  No blood in the stool, she has had some noted on the toilet paper which she thinks is due to hemorrhoids.  Her last colonoscopy was within the past few years.  She has had longstanding reflux which has bothered her intermittently.  She was on low-dose omeprazole for some time which she tolerated well but unfortunately stopped working despite high dosing.  We transitioned her to [place] which she states worked great for her however insurance would not cover it long-term.  We transitioned her back to Protonix twice daily and recently months and states she did not tolerate this at all.  She felt quite nauseated on it and it caused headaches.  When she stopped taking it her nausea and headaches proved.  She has not been on anything for the past week and states her reflux has come back and really bothering  her.  She inquires about long-term options.  She recently had a DEXA scan which showed that she has osteopenia she does have a prior history of osteoporosis.  We discussed long-term risks of PPI and her options.  She has not been on an H2 blocker recently.  She does have a history of bone fractures.  Otherwise reports her dysphagia has recurred over the past month.  She has swallowing difficulty both solids and liquids, localizes this to her throat.  She states coming off PPI seems to have made this worse. She has had an EGD in the past without evidence of Barrett's esophagus, done in 2019.    I performed empiric dilation for her at that time for symptoms of dysphagia and states her dysphagia resolved for a long time.  She had a superficial mucosal wrent at the UES thought to be potentially the cause of her symptoms.   Prior workup: Abdominal/Pelvic CT with contrast 08/06/2020: Lower chest: Normal heart size. Lung bases are clear. No pleural effusion. Hepatobiliary: Liver is normal in size and contour. Subcentimeter too small to characterize low-attenuation lesions within the hepatic dome and left hepatic lobe. Gallbladder is unremarkable. No intrahepatic or extrahepatic biliary ductal dilatation. Pancreas: Unremarkable Spleen: Unremarkable Adrenals/Urinary Tract: Normal adrenal glands. Kidneys enhance symmetrically with contrast. No hydronephrosis. Urinary bladder is unremarkable. Stomach/Bowel: Small hiatal hernia. Normal morphology of the stomach. No evidence for bowel obstruction. No free fluid  or free intraperitoneal air. Vascular/Lymphatic: Normal caliber abdominal aorta. Peripheral calcified atherosclerotic plaque. No retroperitoneal lymphadenopathy. Reproductive: Prior hysterectomy. Musculoskeletal: Lower thoracic and lumbar spine degenerative changes. No aggressive or acute appearing osseous lesions.   EGD 12/24/2018: - Esophagogastric landmarks identified. - Normal  esophagus - empiric dilation performed to 27mm with small mucosal wrent at the UES - Erythematous mucosa in the gastric body. - Normal duodenal bulb and second portion of the duodenum. - Biopsies were taken with a cold forceps for Helicobacter pylori testing.  Colonoscopy 12/24/2018: - One 4 mm polyp in the ascending colon, removed with a cold snare. Resected and retrieved. - One 4 mm polyp in the transverse colon, removed with a cold snare. Resected and retrieved. - One 4 mm polyp in the descending colon, removed with a cold snare. Resected and retrieved. - One 3 mm polyp in the sigmoid colon, removed with a cold snare. Resected and retrieved. - Anal papilla(e) were hypertrophied. Biopsied. - Tortuous colon, which prohibited ileal intubation. - Internal hemorrhoids. - Mostly adequate prep, fair in the cecum. - The examination was otherwise normal. No evidence of IBD or fistula. Suspect rectal bleeding is due to hemorrhoids in the setting of constipation.  1. Surgical [P], gastric antrum and gastric body - MILD REACTIVE GASTROPATHY. - NEGATIVE FOR HELICOBACTER PYLORI. - NO INTESTINAL METAPLASIA, DYSPLASIA, OR MALIGNANCY. 2. Surgical [P], transverse, ascending, sigmoid, polyp (2) - TUBULAR ADENOMA. - SESSILE SERRATED POLYP WITHOUT DYSPLASIA (X2 FRAGMENTS). - NO HIGH GRADE DYSPLASIA OR MALIGNANCY. 3. Surgical [P], anal papilla, polyp - POLYPOID FRAGMENT OF BENIGN SQUAMOUS MUCOSA. - NO DYSPLASIA OR MALIGNANCY.   Past Medical History:  Diagnosis Date  . Allergy   . Anemia   . Anxiety    past hx   . Arthritis   . Asthma   . Atrial flutter ([place])    past history- not current  . CAD (coronary artery disease) CARDIOLOGIST--  DR Angelena Form   mild non-obstructive cad  . Cancer ([place])    right  . Cataract    bilaterally removed   . Chronic constipation   . Chronic kidney disease    interstitial cystitis  . Concussion    x 3  . COPD (chronic obstructive pulmonary disease)  ([place])   . Depression    past hx   . Family history of malignant hyperthermia    father had this  . Fibromyalgia   . Frequency of urination   . GERD (gastroesophageal reflux disease)   . H/O hiatal hernia   . History of basal cell carcinoma excision    X2  . History of breast cancer ONCOLOGIST-- DR Jana Hakim---  NO RECURRANCE   DX 07/2012;  LOW GRADE DCIS  ER+PR+  ----  S/P RIGHT LUMPECTOMY WITH NEGATIVE MARGINS/   RADIATION ENDED 11/2012  . History of chronic bronchitis   . History of colonic polyps   . History of tachycardia    CONTROLLED  WITH ATENOLOL  . Hyperlipidemia   . Hypertension   . Neuromuscular disorder (H[facility])    fibromyalgia  . Neuropathy   . Osteoporosis 01/2019   T score -2.2 stable/improved from prior study  . Pelvic pain   . Personal history of radiation therapy   . S/P radiation therapy 11/12/12 - 12/05/12   right Breast  . Sepsis ([place]) 2014   from UTI   . Sinus headache   . Urgency of urination      Past Surgical History:  Procedure Laterality Date  . 24 HOUR  [place] STUDY N/A 04/03/2016   Procedure: 24 HOUR PH STUDY;  Surgeon: Manus Gunning, MD;  Location: [facility];  Service: Gastroenterology;  Laterality: N/A;  . BREAST LUMPECTOMY Right 10-11-2012   W/ SLN BX  . CARDIAC CATHETERIZATION  09-13-2007  DR Lia Foyer   WELL-PRESERVED LVF/  DIFFUSE SCATTERED CORONARY CALCIFACATION AND ATHEROSCLEROSIS WITHOUT OBSTRUCTION  . CARDIAC CATHETERIZATION  08-04-2010  DR Angelena Form   NON-OBSTRUCTIVE CAD/  pLAD 40%/  oLAD 30%/  mLAD 30%/  pRCA 30%/  EF 60%  . CARDIOVASCULAR STRESS TEST  06-18-2012  DR McALHANY   LOW RISK NUCLEAR STUDY/  SMALL FIXED AREA OF MODERATELY DECREASED UPTAKE IN ANTEROSEPTAL WALL WHICH MAY BE ARTIFACTUAL/  NO ISCHEMIA/  EF 68%  . COLONOSCOPY  09-29-2010  . CYSTOSCOPY    . CYSTOSCOPY WITH HYDRODISTENSION AND BIOPSY N/A 03/06/2014   Procedure: CYSTOSCOPY/HYDRODISTENSION/ INSTILATION OF MARCAINE AND PYRIDIUM;  Surgeon: Ailene Rud,  MD;  [facility];  Service: Urology;  Laterality: N/A;  . ESOPHAGEAL MANOMETRY N/A 04/03/2016   Procedure: ESOPHAGEAL MANOMETRY (EM);  Surgeon: Manus Gunning, MD;  Location: [facility];  Service: Gastroenterology;  Laterality: N/A;  . EXTRACORPOREAL SHOCK WAVE LITHOTRIPSY Left 02/06/2019   Procedure: EXTRACORPOREAL SHOCK WAVE LITHOTRIPSY (ESWL);  Surgeon: Kathie Rhodes, MD;  Location: [facility];  Service: Urology;  Laterality: Left;  . NASAL SINUS SURGERY  1985  . ORIF RIGHT ANKLE  FX  2006  . POLYPECTOMY    . REMOVAL VOCAL CORD CYST  FEB 2014  . RIGHT BREAST BX  08-23-2012  . RIGHT HAND SURGERY  X3  LAST ONE 2009   INCLUDES  ORIF RIGHT 5TH FINGER AND REVISION TWICE  . SKIN CANCER EXCISION    . TONSILLECTOMY AND ADENOIDECTOMY  AGE 62  . TOTAL ABDOMINAL HYSTERECTOMY W/ BILATERAL SALPINGOOPHORECTOMY  1982   W/  APPENDECTOMY  . TRANSTHORACIC ECHOCARDIOGRAM  06-24-2012   GRADE I DIASTOLIC DYSFUNCTION/  EF 55-60%/  MILD MR  . UPPER GASTROINTESTINAL ENDOSCOPY     Family History  Problem Relation Age of Onset  . Rectal cancer Mother   . Colon cancer Mother   . Pancreatic cancer Mother   . Diabetes Mother   . Breast cancer Mother 65  . Breast cancer Maternal Aunt        breast  . Birth defects Maternal Aunt   . Irritable bowel syndrome Son   . Heart disease Son        CAD, MV replacement  . Allergic Disorder Daughter   . Diabetes Daughter   . Colon cancer Father   . Colon polyps Father   . Diabetes Father   . Stroke Father   . Heart disease Father        CHF  . Hyperlipidemia Father   . Hypertension Father   . Arthritis Father   . Breast cancer Paternal Aunt   . Breast cancer Paternal Aunt   . Arthritis Paternal Uncle   . Allergic Disorder Daughter   . Heart disease Cousin        CAD, had a blood clot after stenting  . Esophageal cancer Neg Hx   . Stomach cancer Neg Hx    Social History   Tobacco Use  . Smoking status: Former Smoker     Packs/day: 1.00    Years: 15.00    Pack years: 15.00    Types: Cigarettes    Quit date: 05/27/2005    Years since quitting: 15.9  . Smokeless tobacco:  Never Used  Vaping Use  . Vaping Use: Never used  Substance Use Topics  . Alcohol use: No    Alcohol/week: 0.0 standard drinks  . Drug use: No   Current Outpatient Medications  Medication Sig Dispense Refill  . acetaminophen (TYLENOL) 500 MG tablet Take 1,000 mg by mouth every 6 (six) hours as needed for mild pain or headache.     . albuterol (PROVENTIL HFA;VENTOLIN HFA) 108 (90 BASE) MCG/ACT inhaler Inhale 2 puffs into the lungs every 4 (four) hours as needed. 1 Inhaler 5  . amLODipine (NORVASC) 10 MG tablet Take 1 tablet (10 mg total) by mouth daily. 90 tablet 3  . aspirin EC 81 MG tablet Take 1 tablet (81 mg total) by mouth daily. 90 tablet 3  . atenolol (TENORMIN) 50 MG tablet Take 1 tablet by mouth twice daily 180 tablet 1  . Cyanocobalamin (VITAMIN B-12) 5000 MCG TBDP Take 1 tablet by mouth daily with breakfast.    . denosumab (PROLIA) 60 MG/ML SOSY injection Inject 60 mg into the skin every 6 (six) months.    . DULoxetine (CYMBALTA) 60 MG capsule Take 2 capsules (120 mg total) by mouth daily. 180 capsule 1  . Estradiol 10 MCG TABS vaginal tablet Place 1 tablet (10 mcg total) vaginally 2 (two) times a week. 24 tablet 4  . Evolocumab (REPATHA SURECLICK) 893 MG/ML SOAJ Inject 140 mg into the skin every 14 (fourteen) days. 1 mL 0  . fluticasone (FLONASE) 50 MCG/ACT nasal spray Use 2 spray(s) in each nostril once daily 16 g 5  . furosemide (LASIX) 20 MG tablet Take 1 tablet (20 mg total) by mouth as needed for edema. 90 tablet 3  . gabapentin (NEURONTIN) 300 MG capsule Take 300 mg by mouth. 2cap in the morning, 2cap at noon, and 3caps at bedtime    . hyoscyamine (LEVSIN SL) 0.125 MG SL tablet Place 1 tablet (0.125 mg total) under the tongue every 4 (four) hours as needed. 30 tablet 0  . loratadine (CLARITIN) 10 MG tablet Take 1 tablet (10  mg total) by mouth daily. 30 tablet 11  . losartan (COZAAR) 100 MG tablet Take 1 tablet by mouth once daily 90 tablet 3  . mupirocin ointment (BACTROBAN) 2 % Apply thin film twice daily. 22 g 0  . nitroGLYCERIN (NITROSTAT) 0.4 MG SL tablet Place 1 tablet (0.4 mg total) under the tongue every 5 (five) minutes as needed for chest pain. 25 tablet 1  . pentosan polysulfate (ELMIRON) 100 MG capsule Take 1 capsule (100 mg total) by mouth 3 (three) times daily. Reported on 01/05/2016 (Patient taking differently: Take 100 mg by mouth 3 (three) times daily with meals as needed. Reported on 01/05/2016) 90 capsule 11  . pramipexole (MIRAPEX) 0.125 MG tablet Take 1-2 tablets (0.125-0.25 mg total) by mouth in the morning and at bedtime. 60 tablet 5  . traZODone (DESYREL) 100 MG tablet Take 1 tablet (100 mg total) by mouth at bedtime. 90 tablet 1  . polyethylene glycol (MIRALAX) 17 g packet Take 17 g by mouth daily. (Patient not taking: Reported on 04/25/2021) 14 each 0   No current facility-administered medications for this visit.   Facility-Administered Medications Ordered in Other Visits  Medication Dose Route Frequency Provider Last Rate Last Admin  . bupivacaine (MARCAINE) 0.5 % 10 mL, triamcinolone acetonide (KENALOG-40) 40 mg injection   Subcutaneous Once Carolan Clines, MD      . bupivacaine (MARCAINE) 0.5 % 15 mL, phenazopyridine (PYRIDIUM) 400 mg  bladder mixture   Bladder Instillation Once Carolan Clines, MD       Allergies  Allergen Reactions  . Clindamycin Rash and Shortness Of Breath  . Clindamycin Hcl Shortness Of Breath and Rash  . Penicillins Anaphylaxis  . Prednisone Shortness Of Breath and Rash    Other reaction(s): SOB, Rash, Hives  . Rosuvastatin Anaphylaxis  . Baclofen Other (See Comments) and Rash  . Cortisone Other (See Comments), Hives and Swelling  . Lincomycin Other (See Comments)  . Clindamycin Hcl     Other reaction(s): Shortness of Breath, Rash  . Codeine Hives and  Other (See Comments)    headache Other reaction(s): Headache, Nausea, headache  . Erythromycin Hives  . Erythromycin Base Other (See Comments)    other  . Fluzone [Influenza Virus Vaccine] Other (See Comments)    Local reaction at the site  . Haemophilus Influenzae Other (See Comments)    Local reaction at the site Local reaction at the site  . Haemophilus Influenzae Vaccines Other (See Comments)    Local reaction at site  . Latex Hives  . Other Other (See Comments)    Local reaction at site  . Pentazocine Other (See Comments)  . Pentazocine Lactate Other (See Comments)    HALLUCINATION  . Pneumococcal Vaccine Hives and Swelling    Other reaction(s): Hives, Shortness of Breath  . Pneumococcal Vaccine Polyvalent Hives, Swelling and Other (See Comments)    REACTION: redness, swelling, and hives at injection site  . Tamoxifen Nausea And Vomiting and Other (See Comments)    HEADACHE Other reaction(s): Headache, Nausea     Review of Systems: All systems reviewed and negative except where noted in HPI.    Lab Results  Component Value Date   WBC 9.8 03/08/2021   HGB 12.0 03/08/2021   HCT 37.6 03/08/2021   MCV 93.1 03/08/2021   PLT 297.0 03/08/2021    Lab Results  Component Value Date   CREATININE 0.99 03/08/2021   BUN 16 03/08/2021   NA 139 03/08/2021   K 4.7 03/08/2021   CL 104 03/08/2021   CO2 29 03/08/2021    Lab Results  Component Value Date   ALT 9 03/08/2021   AST 12 03/08/2021   ALKPHOS 113 03/08/2021   BILITOT 0.4 03/08/2021    Physical Exam: BP 118/74   Pulse 66   Ht 5' 3.5" (1.613 m)   Wt 137 lb (62.1 kg)   BMI 23.89 kg/m  Constitutional: Pleasant,well-developed, female in no acute distress. Neurological: Alert and oriented to person place and time. Psychiatric: Normal mood and affect. Behavior is normal.   ASSESSMENT AND PLAN: 75 year old female here for reassessment of following:  Dysphagia GERD Altered bowel habits  As above,  history of dysphagia that was treated with empiric dilation few years ago which resolved her symptoms, now have recurred over the past month or so and bothering her fairly frequently.  She also has a history of reflux that was responsive to omeprazole in the past but then worsened and not responsive to high-dose omeprazole.  Dexilant worked however not covered by [employer], trial of Protonix not successful she had side effects from that.  We discussed options.  She does have a history of osteoporosis with bone fractures, reviewed long-term risks of chronic PPI use with her.  She has not tried an H2 blocker in the past and has no history of Barrett's esophagus, we will try her on Pepcid 20 mg twice daily to see if that will  work well enough for her.  Given her history it may not be strong enough but she does want to try it prior to escalating her therapy.  Otherwise, I offered her an EGD with repeat dilation in regards to her recurrent dysphagia.  I discussed risks benefits of endoscopy and anesthesia with her and she wants to proceed.  Further recommendations pending that result.  Finally, regards to her bowel habit changes, I do think this is likely precipitated by her diet.  She has tested negative for celiac disease but responds quite well to cutting out dairy and trial of low FODMAP diet.  She has had complete resolution of symptoms and doing quite well right now.  I reviewed dietary advice with her, I think okay to slowly add back foods that she really enjoys, 1 at a time, to see if she tolerates some better than others.  She will do this and monitor her response.  All questions answered.  Plan: - trial of Pepcid 20 mg twice daily (given intolerance of Protonix recently and long-term risks of PPI, she wants to try alternative) - EGD with dilation of the esophagus to treat dysphagia - discussed dietary changes she had made recently and I think okay to slowly reintroduce foods and monitor her response, try  to clarify clear triggers which she would need to avoid moving forward  [name] Cellar, MD [facility] Gastroenterology

## 2021-04-25 NOTE — Patient Instructions (Addendum)
If you are age 76 or older, your body mass index should be between 23-30. Your Body mass index is 23.89 kg/m. If this is out of the aforementioned range listed, please consider follow up with your Primary Care Provider.  If you are age 62 or younger, your body mass index should be between 19-25. Your Body mass index is 23.89 kg/m. If this is out of the aformentioned range listed, please consider follow up with your Primary Care Provider.   You have been scheduled for an endoscopy. Please follow written instructions given to you at your visit today. If you use inhalers (even only as needed), please bring them with you on the day of your procedure.  We have sent the following medications to your pharmacy for you to pick up at your convenience: Pepcid 20 mg: Take twice daily  Thank you for entrusting me with your care and for choosing [facility], Dr. [place] Cellar

## 2021-04-26 ENCOUNTER — Other Ambulatory Visit: Payer: Self-pay

## 2021-04-26 ENCOUNTER — Encounter: Payer: Self-pay | Admitting: Gastroenterology

## 2021-04-26 ENCOUNTER — Ambulatory Visit (AMBULATORY_SURGERY_CENTER): Payer: Medicare Other | Admitting: Gastroenterology

## 2021-04-26 VITALS — BP 137/74 | HR 64 | Temp 96.8°F | Resp 16 | Ht 63.0 in | Wt 137.0 lb

## 2021-04-26 DIAGNOSIS — K2100 Gastro-esophageal reflux disease with esophagitis, without bleeding: Secondary | ICD-10-CM | POA: Diagnosis not present

## 2021-04-26 DIAGNOSIS — R1310 Dysphagia, unspecified: Secondary | ICD-10-CM | POA: Diagnosis not present

## 2021-04-26 DIAGNOSIS — K449 Diaphragmatic hernia without obstruction or gangrene: Secondary | ICD-10-CM

## 2021-04-26 MED ORDER — SODIUM CHLORIDE 0.9 % IV SOLN: 500.0000 mL | Freq: Once | INTRAVENOUS | Status: DC

## 2021-04-26 NOTE — Patient Instructions (Signed)
Thank you for allowing [place] to care for you today!  Begin Pepcid 20 mg twice daily as previous discussed.  Soft foods today.  Post dilation handout given.  Resume normal diet tomorrow.  Resume other medications today.  Warm and cold compresses for TMJ symptoms.  Notify us or dentist if this does not improve today.     YOU HAD AN ENDOSCOPIC PROCEDURE TODAY AT [facility]:   Refer to the procedure report that was given to you for any specific questions about what was found during the examination.  If the procedure report does not answer your questions, please call your gastroenterologist to clarify.  If you requested that your care partner not be given the details of your procedure findings, then the procedure report has been included in a sealed envelope for you to review at your convenience later.  YOU SHOULD EXPECT: Some feelings of bloating in the abdomen. Passage of more gas than usual.  Walking can help get rid of the air that was put into your GI tract during the procedure and reduce the bloating. If you had a lower endoscopy (such as a colonoscopy or flexible sigmoidoscopy) you may notice spotting of blood in your stool or on the toilet paper. If you underwent a bowel prep for your procedure, you may not have a normal bowel movement for a few days.  Please Note:  You might notice some irritation and congestion in your nose or some drainage.  This is from the oxygen used during your procedure.  There is no need for concern and it should clear up in a day or so.  SYMPTOMS TO REPORT IMMEDIATELY:     Following upper endoscopy (EGD)  Vomiting of blood or coffee ground material  New chest pain or pain under the shoulder blades  Painful or persistently difficult swallowing  New shortness of breath  Fever of 100F or higher  Black, tarry-looking stools  For urgent or emergent issues, a gastroenterologist can be reached at any hour by calling [phone]. Do not use  MyChart messaging for urgent concerns.    DIET:  We do recommend a small meal at first, but then you may proceed to your regular diet.  Drink plenty of fluids but you should avoid alcoholic beverages for 24 hours.  ACTIVITY:  You should plan to take it easy for the rest of today and you should NOT DRIVE or use heavy machinery until tomorrow (because of the sedation medicines used during the test).    FOLLOW UP: Our staff will call the number listed on your records 48-72 hours following your procedure to check on you and address any questions or concerns that you may have regarding the information given to you following your procedure. If we do not reach you, we will leave a message.  We will attempt to reach you two times.  During this call, we will ask if you have developed any symptoms of COVID 19. If you develop any symptoms (ie: fever, flu-like symptoms, shortness of breath, cough etc.) before then, please call [phone].  If you test positive for Covid 19 in the 2 weeks post procedure, please call and report this information to [place].    If any biopsies were taken you will be contacted by phone or by letter within the next 1-3 weeks.  Please call us at [phone] if you have not heard about the biopsies in 3 weeks.    SIGNATURES/CONFIDENTIALITY: You and/or your care partner have  signed paperwork which will be entered into your electronic medical record.  These signatures attest to the fact that that the information above on your After Visit Summary has been reviewed and is understood.  Full responsibility of the confidentiality of this discharge information lies with you and/or your care-partner.

## 2021-04-26 NOTE — Progress Notes (Signed)
Called to room to assist during endoscopic procedure.  Patient ID and intended procedure confirmed with present staff. Received instructions for my participation in the procedure from the performing physician.  

## 2021-04-26 NOTE — Progress Notes (Signed)
VS by MO   I have reviewed the patient's medical history in detail and updated the computerized patient record.  

## 2021-04-26 NOTE — Progress Notes (Signed)
PT taken to PACU. Monitors in place. VSS. Report given to RN. 

## 2021-04-26 NOTE — Op Note (Signed)
[facility] Patient Name: [name] Procedure Date: 04/26/2021 12:09 PM MRN: [id number] Endoscopist: Remo Lipps P. Havery Moros , MD Age: 75 Referring MD:  Date of Birth: 07/22/1946 Gender: Female Account #: [id number] Procedure:                Upper GI endoscopy Indications:              Dysphagia, history of subtle stricture at UES that                            has responded well to dilation in the past, history                            of GERD, intolerance to protonix recently, off                            therapy, to start pepcid Medicines:                Monitored Anesthesia Care Procedure:                Pre-Anesthesia Assessment:                           - Prior to the procedure, a History and Physical                            was performed, and patient medications and                            allergies were reviewed. The patient's tolerance of                            previous anesthesia was also reviewed. The risks                            and benefits of the procedure and the sedation                            options and risks were discussed with the patient.                            All questions were answered, and informed consent                            was obtained. Prior Anticoagulants: The patient has                            taken no previous anticoagulant or antiplatelet                            agents. ASA Grade Assessment: II - A patient with                            mild systemic disease. After reviewing the risks  and benefits, the patient was deemed in                            satisfactory condition to undergo the procedure.                           After obtaining informed consent, the endoscope was                            passed under direct vision. Throughout the                            procedure, the patient's blood pressure, pulse, and                            oxygen saturations were  monitored continuously. The                            Endoscope was introduced through the mouth, and                            advanced to the second part of duodenum. The upper                            GI endoscopy was accomplished without difficulty.                            The patient tolerated the procedure well. Scope In: Scope Out: Findings:                 Esophagogastric landmarks were identified: the                            Z-line was found at 36 cm, the gastroesophageal                            junction was found at 36 cm and the upper extent of                            the gastric folds was found at 38 cm from the                            incisors.                           A 2 cm hiatal hernia was present.                           LA Grade A esophagitis was found 36 cm from the                            incisors.                           The exam of  the esophagus was otherwise normal.                           A guidewire was placed and the scope was withdrawn.                            Dilation was performed in the entire esophagus with                            a Savary dilator with mild resistance at 17 mm.                            Relook endoscopy showed an appropriate mucosal                            wrent just inferior to the UES.                           The entire examined stomach was normal.                           The duodenal bulb and second portion of the                            duodenum were normal. Complications:            No immediate complications. Estimated blood loss:                            Minimal. Estimated Blood Loss:     Estimated blood loss was minimal. Impression:               - Esophagogastric landmarks identified.                           - 2 cm hiatal hernia.                           - LA Grade A reflux esophagitis.                           - Normal esophagus otherwise - dilation performed                             to 67mm with good result as outlined.                           - Normal stomach.                           - Normal duodenal bulb and second portion of the                            duodenum. Recommendation:           - Patient has a contact number available for  emergencies. The signs and symptoms of potential                            delayed complications were discussed with the                            patient. Return to normal activities tomorrow.                            Written discharge instructions were provided to the                            patient.                           - Post dilation diet.                           - Continue present medications.                           - Start Pepcid 20mg  twice daily as previously                            discussed                           - Follow up as needed [place]. Embree Brawley, MD 04/26/2021 12:28:05 PM This report has been signed electronically.

## 2021-04-28 ENCOUNTER — Telehealth: Payer: Self-pay

## 2021-04-28 NOTE — Telephone Encounter (Signed)
  Follow up Call-  Call back number 04/26/2021 12/24/2018  Post procedure Call Back phone  # [phone]  Permission to leave phone message Yes Yes  Some recent data might be hidden     Patient questions:  Do you have a fever, pain , or abdominal swelling? No. Pain Score  0 *  Have you tolerated food without any problems? Yes.    Have you been able to return to your normal activities? Yes.    Do you have any questions about your discharge instructions: Diet   No. Medications  No. Follow up visit  No.  Do you have questions or concerns about your Care? No.  Actions: * If pain score is 4 or above: No action needed, pain <4.  1. Have you developed a fever since your procedure? no  2.   Have you had an respiratory symptoms (SOB or cough) since your procedure? no  3.   Have you tested positive for COVID 19 since your procedure no  4.   Have you had any family members/close contacts diagnosed with the COVID 19 since your procedure?  no   If yes to any of these questions please route to Joylene John, RN and Joella Prince, RN

## 2021-04-29 ENCOUNTER — Other Ambulatory Visit: Payer: Self-pay

## 2021-04-29 ENCOUNTER — Ambulatory Visit
Admission: RE | Admit: 2021-04-29 | Discharge: 2021-04-29 | Disposition: A | Discharge status: Home / Self Care | Payer: Medicare Other | Source: Ambulatory Visit | Attending: Family Medicine | Admitting: Family Medicine

## 2021-04-29 DIAGNOSIS — Z1231 Encounter for screening mammogram for malignant neoplasm of breast: Secondary | ICD-10-CM | POA: Diagnosis not present

## 2021-04-29 HISTORY — DX: Malignant neoplasm of unspecified site of unspecified female breast: C50.919

## 2021-05-05 ENCOUNTER — Telehealth (INDEPENDENT_AMBULATORY_CARE_PROVIDER_SITE_OTHER): Payer: Medicare Other | Admitting: Family Medicine

## 2021-05-05 DIAGNOSIS — G4733 Obstructive sleep apnea (adult) (pediatric): Secondary | ICD-10-CM

## 2021-05-05 DIAGNOSIS — R0981 Nasal congestion: Secondary | ICD-10-CM | POA: Diagnosis not present

## 2021-05-05 DIAGNOSIS — R519 Headache, unspecified: Secondary | ICD-10-CM

## 2021-05-05 DIAGNOSIS — J449 Chronic obstructive pulmonary disease, unspecified: Secondary | ICD-10-CM

## 2021-05-05 DIAGNOSIS — R059 Cough, unspecified: Secondary | ICD-10-CM | POA: Diagnosis not present

## 2021-05-05 MED ORDER — ALBUTEROL SULFATE HFA 108 (90 BASE) MCG/ACT IN AERS: 2.0000 | INHALATION_SPRAY | RESPIRATORY_TRACT | 0 refills | Status: DC | PRN

## 2021-05-05 MED ORDER — DOXYCYCLINE HYCLATE 100 MG PO TABS: 100.0000 mg | ORAL_TABLET | Freq: Two times a day (BID) | ORAL | 0 refills | Status: DC

## 2021-05-05 MED ORDER — BENZONATATE 100 MG PO CAPS: 100.0000 mg | ORAL_CAPSULE | Freq: Three times a day (TID) | ORAL | 0 refills | Status: DC | PRN

## 2021-05-05 NOTE — Patient Instructions (Signed)
-  I sent the medication(s) we discussed to your pharmacy: Meds ordered this encounter  Medications  . doxycycline (VIBRA-TABS) 100 MG tablet    Sig: Take 1 tablet (100 mg total) by mouth 2 (two) times daily.    Dispense:  20 tablet    Refill:  0  . albuterol (VENTOLIN HFA) 108 (90 Base) MCG/ACT inhaler    Sig: Inhale 2 puffs into the lungs every 4 (four) hours as needed.    Dispense:  1 each    Refill:  0  . benzonatate (TESSALON PERLES) 100 MG capsule    Sig: Take 1 capsule (100 mg total) by mouth 3 (three) times daily as needed.    Dispense:  20 capsule    Refill:  0   Do a test for covid.   I hope you are feeling better soon!  Seek in person care promptly if your symptoms worsen, new concerns arise or you are not improving with treatment.  It was nice to meet you today. I help [facility] out with telemedicine visits on Tuesdays and Thursdays and am available for visits on those days. If you have any concerns or questions following this visit please schedule a follow up visit with your Primary Care doctor or seek care at a local urgent care clinic to avoid delays in care.

## 2021-05-05 NOTE — Progress Notes (Signed)
Virtual Visit via Video Note  I connected with [name]  on 05/05/21 at 12:20 PM EDT by a video enabled telemedicine application and verified that I am speaking with the correct person using two identifiers.  Location patient: home, [place] Location provider:work or home office Persons participating in the virtual visit: patient, provider, husband  I discussed the limitations of evaluation and management by telemedicine and the availability of in person appointments. The patient expressed understanding and agreed to proceed.   HPI:  Acute telemedicine visit for sinus issues: -Onset: has had issues with sinuses the last few month, but worse the last 10 days -Symptoms include: sinus congestion, sinus discomfort, teeth hurt, thick mucus, cough, low grade temp in the 99 range -Denies: fevers, CP, SOB, NVD, inability to eat/drink/get out of bed -Has tried:allergy meds, tylenol -Pertinent past medical history: see below - extensive -Pertinent medication allergies: many - reviewed; she has taken doxy in the past without serious reaction per her report -COVID-19 vaccine status: fully vaccinated and boosted -requests refill on albuterol as reports has copd/asthma and uses when sick at times - not SOB currently  ROS: See pertinent positives and negatives per HPI.  Past Medical History:  Diagnosis Date  . Allergy   . Anemia   . Anxiety    past hx   . Arthritis   . Asthma   . Atrial flutter ([place])    past history- not current  . Breast cancer ([place])   . CAD (coronary artery disease) CARDIOLOGIST--  DR Angelena Form   mild non-obstructive cad  . Cancer ([place])    right  . Cataract    bilaterally removed   . Chronic constipation   . Chronic kidney disease    interstitial cystitis  . Concussion    x 3  . COPD (chronic obstructive pulmonary disease) ([place])   . Depression    past hx   . Family history of malignant hyperthermia    father had this  . Fibromyalgia   . Frequency of urination   . GERD  (gastroesophageal reflux disease)   . H/O hiatal hernia   . History of basal cell carcinoma excision    X2  . History of breast cancer ONCOLOGIST-- DR Jana Hakim---  NO RECURRANCE   DX 07/2012;  LOW GRADE DCIS  ER+PR+  ----  S/P RIGHT LUMPECTOMY WITH NEGATIVE MARGINS/   RADIATION ENDED 11/2012  . History of chronic bronchitis   . History of colonic polyps   . History of tachycardia    CONTROLLED  WITH ATENOLOL  . Hyperlipidemia   . Hypertension   . Neuromuscular disorder (HCC)    fibromyalgia  . Neuropathy   . Osteoporosis 01/2019   T score -2.2 stable/improved from prior study  . Pelvic pain   . Personal history of radiation therapy   . S/P radiation therapy 11/12/12 - 12/05/12   right Breast  . Sepsis ([place]) 2014   from UTI   . Sinus headache   . Urgency of urination     Past Surgical History:  Procedure Laterality Date  . [place] STUDY N/A 04/03/2016   Procedure: [place] STUDY;  Surgeon: Manus Gunning, MD;  Location: [facility];  Service: Gastroenterology;  Laterality: N/A;  . BREAST BIOPSY Right 08/23/2012   ADH  . BREAST BIOPSY Right 10/11/2012   Ductal Carcinoma  . BREAST EXCISIONAL BIOPSY Right 09/04/2013   benign  . BREAST LUMPECTOMY Right 10-11-2012   W/ SLN BX  . CARDIAC CATHETERIZATION  09-13-2007  DR Lia Foyer   WELL-PRESERVED LVF/  DIFFUSE SCATTERED CORONARY CALCIFACATION AND ATHEROSCLEROSIS WITHOUT OBSTRUCTION  . CARDIAC CATHETERIZATION  08-04-2010  DR Angelena Form   NON-OBSTRUCTIVE CAD/  pLAD 40%/  oLAD 30%/  mLAD 30%/  pRCA 30%/  EF 60%  . CARDIOVASCULAR STRESS TEST  06-18-2012  DR McALHANY   LOW RISK NUCLEAR STUDY/  SMALL FIXED AREA OF MODERATELY DECREASED UPTAKE IN ANTEROSEPTAL WALL WHICH MAY BE ARTIFACTUAL/  NO ISCHEMIA/  EF 68%  . COLONOSCOPY  09-29-2010  . CYSTOSCOPY    . CYSTOSCOPY WITH HYDRODISTENSION AND BIOPSY N/A 03/06/2014   Procedure: CYSTOSCOPY/HYDRODISTENSION/ INSTILATION OF MARCAINE AND PYRIDIUM;  Surgeon: Ailene Rud, MD;   Location: [facility];  Service: Urology;  Laterality: N/A;  . ESOPHAGEAL MANOMETRY N/A 04/03/2016   Procedure: ESOPHAGEAL MANOMETRY (EM);  Surgeon: Manus Gunning, MD;  Location: [facility];  Service: Gastroenterology;  Laterality: N/A;  . EXTRACORPOREAL SHOCK WAVE LITHOTRIPSY Left 02/06/2019   Procedure: EXTRACORPOREAL SHOCK WAVE LITHOTRIPSY (ESWL);  Surgeon: Kathie Rhodes, MD;  Location: [facility];  Service: Urology;  Laterality: Left;  . NASAL SINUS SURGERY  1985  . ORIF RIGHT ANKLE  FX  2006  . POLYPECTOMY    . REMOVAL VOCAL CORD CYST  FEB 2014  . RIGHT BREAST BX  08-23-2012  . RIGHT HAND SURGERY  X3  LAST ONE 2009   INCLUDES  ORIF RIGHT 5TH FINGER AND REVISION TWICE  . SKIN CANCER EXCISION    . TONSILLECTOMY AND ADENOIDECTOMY  AGE 42  . TOTAL ABDOMINAL HYSTERECTOMY W/ BILATERAL SALPINGOOPHORECTOMY  1982   W/  APPENDECTOMY  . TRANSTHORACIC ECHOCARDIOGRAM  06-24-2012   GRADE I DIASTOLIC DYSFUNCTION/  EF 55-60%/  MILD MR  . UPPER GASTROINTESTINAL ENDOSCOPY       Current Outpatient Medications:  .  benzonatate (TESSALON PERLES) 100 MG capsule, Take 1 capsule (100 mg total) by mouth 3 (three) times daily as needed., Disp: 20 capsule, Rfl: 0 .  doxycycline (VIBRA-TABS) 100 MG tablet, Take 1 tablet (100 mg total) by mouth 2 (two) times daily., Disp: 20 tablet, Rfl: 0 .  acetaminophen (TYLENOL) 500 MG tablet, Take 1,000 mg by mouth every 6 (six) hours as needed for mild pain or headache. , Disp: , Rfl:  .  albuterol (VENTOLIN HFA) 108 (90 Base) MCG/ACT inhaler, Inhale 2 puffs into the lungs every 4 (four) hours as needed., Disp: 1 each, Rfl: 0 .  amLODipine (NORVASC) 10 MG tablet, Take 1 tablet (10 mg total) by mouth daily., Disp: 90 tablet, Rfl: 3 .  aspirin EC 81 MG tablet, Take 1 tablet (81 mg total) by mouth daily., Disp: 90 tablet, Rfl: 3 .  atenolol (TENORMIN) 50 MG tablet, Take 1 tablet by mouth twice daily, Disp: 180 tablet, Rfl: 1 .  Cyanocobalamin (VITAMIN  B-12) 5000 MCG TBDP, Take 1 tablet by mouth daily with breakfast., Disp: , Rfl:  .  denosumab (PROLIA) 60 MG/ML SOSY injection, Inject 60 mg into the skin every 6 (six) months., Disp: , Rfl:  .  DULoxetine (CYMBALTA) 60 MG capsule, Take 2 capsules (120 mg total) by mouth daily., Disp: 180 capsule, Rfl: 1 .  Estradiol 10 MCG TABS vaginal tablet, Place 1 tablet (10 mcg total) vaginally 2 (two) times a week., Disp: 24 tablet, Rfl: 4 .  Evolocumab (REPATHA SURECLICK) 176 MG/ML SOAJ, Inject 140 mg into the skin every 14 (fourteen) days., Disp: 1 mL, Rfl: 0 .  famotidine (PEPCID) 20 MG tablet, Take 1 tablet (20  mg total) by mouth 2 (two) times daily., Disp: 60 tablet, Rfl: 3 .  fluticasone (FLONASE) 50 MCG/ACT nasal spray, Use 2 spray(s) in each nostril once daily, Disp: 16 g, Rfl: 5 .  furosemide (LASIX) 20 MG tablet, Take 1 tablet (20 mg total) by mouth as needed for edema., Disp: 90 tablet, Rfl: 3 .  gabapentin (NEURONTIN) 300 MG capsule, Take 300 mg by mouth. 2cap in the morning, 2cap at noon, and 3caps at bedtime, Disp: , Rfl:  .  hyoscyamine (LEVSIN SL) 0.125 MG SL tablet, Place 1 tablet (0.125 mg total) under the tongue every 4 (four) hours as needed., Disp: 30 tablet, Rfl: 0 .  loratadine (CLARITIN) 10 MG tablet, Take 1 tablet (10 mg total) by mouth daily., Disp: 30 tablet, Rfl: 11 .  losartan (COZAAR) 100 MG tablet, Take 1 tablet by mouth once daily, Disp: 90 tablet, Rfl: 3 .  mupirocin ointment (BACTROBAN) 2 %, Apply thin film twice daily., Disp: 22 g, Rfl: 0 .  nitroGLYCERIN (NITROSTAT) 0.4 MG SL tablet, Place 1 tablet (0.4 mg total) under the tongue every 5 (five) minutes as needed for chest pain., Disp: 25 tablet, Rfl: 1 .  pentosan polysulfate (ELMIRON) 100 MG capsule, Take 1 capsule (100 mg total) by mouth 3 (three) times daily. Reported on 01/05/2016 (Patient taking differently: Take 100 mg by mouth 3 (three) times daily with meals as needed. Reported on 01/05/2016), Disp: 90 capsule, Rfl:  11 .  polyethylene glycol (MIRALAX) 17 g packet, Take 17 g by mouth daily. (Patient not taking: Reported on 04/25/2021), Disp: 14 each, Rfl: 0 .  pramipexole (MIRAPEX) 0.125 MG tablet, Take 1-2 tablets (0.125-0.25 mg total) by mouth in the morning and at bedtime., Disp: 60 tablet, Rfl: 5 .  traZODone (DESYREL) 100 MG tablet, Take 1 tablet (100 mg total) by mouth at bedtime., Disp: 90 tablet, Rfl: 1  Current Facility-Administered Medications:  .  0.9 %  sodium chloride infusion, 500 mL, Intravenous, Once, [place], Carlota Raspberry, MD .  0.9 %  sodium chloride infusion, 500 mL, Intravenous, Once, Armbruster, Carlota Raspberry, MD  Facility-Administered Medications Ordered in Other Visits:  .  bupivacaine (MARCAINE) 0.5 % 10 mL, triamcinolone acetonide (KENALOG-40) 40 mg injection, , Subcutaneous, Once, Carolan Clines, MD .  bupivacaine (MARCAINE) 0.5 % 15 mL, phenazopyridine (PYRIDIUM) 400 mg bladder mixture, , Bladder Instillation, Once, Carolan Clines, MD  EXAMTonette Bihari per patient if applicable:  GENERAL: alert, oriented, appears well and in no acute distress  HEENT: atraumatic, conjunttiva clear, no obvious abnormalities on inspection of external nose and ears  NECK: normal movements of the head and neck  LUNGS: on inspection no signs of respiratory distress, breathing rate appears normal, no obvious gross SOB, gasping or wheezing  CV: no obvious cyanosis  MS: moves all visible extremities without noticeable abnormality  PSYCH/NEURO: pleasant and cooperative, no obvious depression or anxiety, speech and thought processing grossly intact  ASSESSMENT AND PLAN:  Discussed the following assessment and plan:  Nasal congestion  Facial discomfort  Cough  OSA and COPD overlap syndrome (HCC)  -we discussed possible serious and likely etiologies, options for evaluation and workup, limitations of telemedicine visit vs in person visit, treatment, treatment risks and precautions. Pt  prefers to treat via telemedicine empirically rather than in person at this moment. Query Viral resp illnes versus, sinusitis, LRI vs other. Advised covid testing - though 10 days in and would not likely change acute treatment at this time. She opted for starting  doxy. She also requested refill on her albuterol for chronic bronchitis. Discussed potential complications and precautions.  Scheduled follow up with PCP offered:agrees to follow up as needed Advised to seek prompt in person care if worsening, new symptoms arise, or if is not improving with treatment. Discussed options for inperson care if PCP office not available. Did let this patient know that I only do telemedicine on Tuesdays and Thursdays for [place]. Advised to schedule follow up visit with PCP or [facility] if any further questions or concerns to avoid delays in care.   I discussed the assessment and treatment plan with the patient. The patient was provided an opportunity to ask questions and all were answered. The patient agreed with the plan and demonstrated an understanding of the instructions.     Lucretia Kern, DO

## 2021-05-06 ENCOUNTER — Other Ambulatory Visit: Payer: Self-pay | Admitting: Cardiology

## 2021-05-09 ENCOUNTER — Telehealth: Payer: Self-pay | Admitting: Family Medicine

## 2021-05-09 ENCOUNTER — Other Ambulatory Visit: Payer: Self-pay | Admitting: Family Medicine

## 2021-05-09 DIAGNOSIS — R059 Cough, unspecified: Secondary | ICD-10-CM

## 2021-05-09 MED ORDER — PROMETHAZINE-DM 6.25-15 MG/5ML PO SYRP: 5.0000 mL | ORAL_SOLUTION | Freq: Three times a day (TID) | ORAL | 0 refills | Status: DC | PRN

## 2021-05-09 NOTE — Telephone Encounter (Signed)
Patient states she still feeling bad. She as prescribed an antiboitc and an in inhaler and cough still hurts. She suppose to take for  10 days and she only day 6 in but is not feeling better wanted to know if she should be concerned or should she maybe get an xray on her chest for better relief.

## 2021-05-09 NOTE — Telephone Encounter (Signed)
Pt seen by Dr. Maudie Mercury at [facility] virtually- she is out of the office until 05/17/21. Please advise.

## 2021-05-09 NOTE — Telephone Encounter (Signed)
Please let her know I have sent her in Promethazine DM liquid to take a tsp up to 3 x a day to minimize the cough and I have ordered a CXR at the [facility] to have done at her discretion. Confirm she has had covid testing done. Remember the antibiotic will only help if this is a bacterial infection. Viral infection just has to run it's course over several weeks.

## 2021-05-10 NOTE — Telephone Encounter (Signed)
Advised patient about note below.  She will do CXR.  Patient had neg covid test last week.

## 2021-05-11 ENCOUNTER — Ambulatory Visit (HOSPITAL_BASED_OUTPATIENT_CLINIC_OR_DEPARTMENT_OTHER)
Admission: RE | Admit: 2021-05-11 | Discharge: 2021-05-11 | Disposition: A | Discharge status: Home / Self Care | Payer: Medicare Other | Source: Ambulatory Visit | Attending: Family Medicine | Admitting: Family Medicine

## 2021-05-11 ENCOUNTER — Other Ambulatory Visit: Payer: Self-pay

## 2021-05-11 DIAGNOSIS — R059 Cough, unspecified: Secondary | ICD-10-CM

## 2021-05-16 ENCOUNTER — Telehealth: Payer: Self-pay | Admitting: *Deleted

## 2021-05-16 NOTE — Telephone Encounter (Signed)
[name], [name]  05/16/2021 12:40 PM EDT      Lvm and asked if her cough has gotten worse, stable or improved   Mosie Lukes, MD  05/14/2021 1:40 PM EDT      I just checked and her next Ct scan for surveillance purposes is due in July. Do not want to put her through too much radiation. Is her cough worsening, stable or improving?    [name], CMA  05/13/2021 4:36 PM EDT      Pt would like to proceed with the CT scan. Pt was smoking on for about 10 years on and off, started in her 73's and quit 2004    Mosie Lukes, MD  05/13/2021 3:18 PM EDT      If her cough is persistent does she want to proceed with CT scan? If so confirm how many years did she smoke at what age did she start, when did she stop.   Mosie Lukes, MD  05/13/2021 3:05 PM EDT      Normal, no new concerns, no changes

## 2021-05-16 NOTE — Telephone Encounter (Signed)
Spoke with patient and she stated that her cough is a hair better.  She still feels fatigue, has all over body aches, she also now has drainage coming from her sinuses.  She would like for someone to listen to her chest.  She took another COVID test last night and it was negative.

## 2021-05-16 NOTE — Telephone Encounter (Signed)
See if she can come in at 1 to be seen 5/24 or on Thursday at some point and I will listen to her.

## 2021-05-17 ENCOUNTER — Other Ambulatory Visit: Payer: Self-pay

## 2021-05-17 ENCOUNTER — Ambulatory Visit (INDEPENDENT_AMBULATORY_CARE_PROVIDER_SITE_OTHER): Payer: Medicare Other | Admitting: Family Medicine

## 2021-05-17 ENCOUNTER — Encounter: Payer: Self-pay | Admitting: Family Medicine

## 2021-05-17 VITALS — BP 102/64 | HR 58 | Temp 98.1°F | Resp 16 | Wt 137.0 lb

## 2021-05-17 DIAGNOSIS — E538 Deficiency of other specified B group vitamins: Secondary | ICD-10-CM

## 2021-05-17 DIAGNOSIS — R509 Fever, unspecified: Secondary | ICD-10-CM | POA: Diagnosis not present

## 2021-05-17 DIAGNOSIS — I10 Essential (primary) hypertension: Secondary | ICD-10-CM | POA: Diagnosis not present

## 2021-05-17 DIAGNOSIS — K219 Gastro-esophageal reflux disease without esophagitis: Secondary | ICD-10-CM

## 2021-05-17 DIAGNOSIS — R7989 Other specified abnormal findings of blood chemistry: Secondary | ICD-10-CM

## 2021-05-17 DIAGNOSIS — Z87891 Personal history of nicotine dependence: Secondary | ICD-10-CM

## 2021-05-17 DIAGNOSIS — J329 Chronic sinusitis, unspecified: Secondary | ICD-10-CM

## 2021-05-17 DIAGNOSIS — F32A Depression, unspecified: Secondary | ICD-10-CM

## 2021-05-17 DIAGNOSIS — Z789 Other specified health status: Secondary | ICD-10-CM

## 2021-05-17 DIAGNOSIS — R059 Cough, unspecified: Secondary | ICD-10-CM

## 2021-05-17 DIAGNOSIS — R1310 Dysphagia, unspecified: Secondary | ICD-10-CM

## 2021-05-17 DIAGNOSIS — J45909 Unspecified asthma, uncomplicated: Secondary | ICD-10-CM

## 2021-05-17 DIAGNOSIS — J383 Other diseases of vocal cords: Secondary | ICD-10-CM

## 2021-05-17 DIAGNOSIS — N289 Disorder of kidney and ureter, unspecified: Secondary | ICD-10-CM

## 2021-05-17 MED ORDER — ATENOLOL 50 MG PO TABS: 50.0000 mg | ORAL_TABLET | Freq: Two times a day (BID) | ORAL | 1 refills | Status: DC

## 2021-05-17 MED ORDER — DOXYCYCLINE HYCLATE 100 MG PO TABS: 100.0000 mg | ORAL_TABLET | Freq: Two times a day (BID) | ORAL | 0 refills | Status: DC

## 2021-05-17 MED ORDER — PANTOPRAZOLE SODIUM 40 MG PO TBEC: 40.0000 mg | DELAYED_RELEASE_TABLET | Freq: Every day | ORAL | 1 refills | Status: DC

## 2021-05-17 MED ORDER — NYSTATIN 100000 UNIT/GM EX CREA: 1.0000 "application " | TOPICAL_CREAM | Freq: Two times a day (BID) | CUTANEOUS | 2 refills | Status: AC

## 2021-05-17 MED ORDER — DULOXETINE HCL 60 MG PO CPEP: 120.0000 mg | ORAL_CAPSULE | Freq: Every day | ORAL | 1 refills | Status: DC

## 2021-05-17 MED ORDER — FAMOTIDINE 40 MG PO TABS: 40.0000 mg | ORAL_TABLET | Freq: Every day | ORAL | 1 refills | Status: DC

## 2021-05-17 NOTE — Patient Instructions (Signed)
Cough, Adult Coughing is a reflex that clears your throat and your airways (respiratory system). Coughing helps to heal and protect your lungs. It is normal to cough occasionally, but a cough that happens with other symptoms or lasts a long time may be a sign of a condition that needs treatment. An acute cough may only last 2-3 weeks, while a chronic cough may last 8 or more weeks. Coughing is commonly caused by:  Infection of the respiratory systemby viruses or bacteria.  Breathing in substances that irritate your lungs.  Allergies.  Asthma.  Mucus that runs down the back of your throat (postnasal drip).  Smoking.  Acid backing up from the stomach into the esophagus (gastroesophageal reflux).  Certain medicines.  Chronic lung problems.  Other medical conditions such as heart failure or a blood clot in the lung (pulmonary embolism). Follow these instructions at home: Medicines  Take over-the-counter and prescription medicines only as told by your health care provider.  Talk with your health care provider before you take a cough suppressant medicine. Lifestyle  Avoid cigarette smoke. Do not use any products that contain nicotine or tobacco, such as cigarettes, e-cigarettes, and chewing tobacco. If you need help quitting, ask your health care provider.  Drink enough fluid to keep your urine pale yellow.  Avoid caffeine.  Do not drink alcohol if your health care provider tells you not to drink.   General instructions  Pay close attention to changes in your cough. Tell your health care provider about them.  Always cover your mouth when you cough.  Avoid things that make you cough, such as perfume, candles, cleaning products, or campfire or tobacco smoke.  If the air is dry, use a cool mist vaporizer or humidifier in your bedroom or your home to help loosen secretions.  If your cough is worse at night, try to sleep in a semi-upright position.  Rest as needed.  Keep all  follow-up visits as told by your health care provider. This is important.   Contact a health care provider if you:  Have new symptoms.  Cough up pus.  Have a cough that does not get better after 2-3 weeks or gets worse.  Cannot control your cough with cough suppressant medicines and you are losing sleep.  Have pain that gets worse or pain that is not helped with medicine.  Have a fever.  Have unexplained weight loss.  Have night sweats. Get help right away if:  You cough up blood.  You have difficulty breathing.  Your heartbeat is very fast. These symptoms may represent a serious problem that is an emergency. Do not wait to see if the symptoms will go away. Get medical help right away. Call your local emergency services (911 in the [place] Do not drive yourself to the hospital. Summary  Coughing is a reflex that clears your throat and your airways. It is normal to cough occasionally, but a cough that happens with other symptoms or lasts a long time may be a sign of a condition that needs treatment.  Take over-the-counter and prescription medicines only as told by your health care provider.  Always cover your mouth when you cough.  Contact a health care provider if you have new symptoms or a cough that does not get better after 2-3 weeks or gets worse. This information is not intended to replace advice given to you by your health care provider. Make sure you discuss any questions you have with your health care provider. Document   Revised: 12/30/2018 Document Reviewed: 12/30/2018 Elsevier Patient Education  2021 [employer]  

## 2021-05-17 NOTE — Progress Notes (Signed)
Patient ID: [name], female    DOB: 1946/04/28  Age: 75 y.o. MRN: [id number]    Subjective:  Subjective  HPI  [name] presents for office visit today for follow up on HTN and coughing. She reports that when she is sitting up, she is able to cough green, yellowish-brownish mucus. She states that with the coughing, she is experiencing a low-grade fever. She denies any chest pain, SOB, abdominal pain, chills, sore throat, dysuria, urinary incontinence, back pain, HA, or N/VD.  She reports that due to her excess coughing, her quality of sleep has decreased which might be contributing to her fatigue. She endorses checking her SPO2 regularly and she states that it is usually around 95%. She reports that the fatigue is bad to the point that when she takes a shower, she feels exhausted and states that she cannot do anything for the rest of the day. She reports that her heartburn symptoms are getting worse. She reports that her teeth has been in pain for about 2-3 weeks that she states is due to her sinus infection. She reports having TMJ strain and endorses having bilateral ear pain.   Review of Systems  Constitutional: Positive for fatigue (excessive, secondary to coughing) and fever (low grade). Negative for chills.       (+) trouble with sleep  HENT: Positive for congestion, ear pain (secondary to TMJ strain), sinus pressure and sinus pain. Negative for rhinorrhea and sore throat.        (+) teeth pain  Eyes: Negative for pain.  Respiratory: Positive for cough. Negative for shortness of breath.   Cardiovascular: Negative for chest pain, palpitations and leg swelling.  Gastrointestinal: Negative for abdominal pain, blood in stool, diarrhea, nausea and vomiting.       (+) heartburn  Genitourinary: Negative for decreased urine volume, flank pain, frequency, vaginal bleeding and vaginal discharge.  Musculoskeletal: Negative for back pain.  Neurological: Negative for headaches.     History Past Medical History:  Diagnosis Date  . Allergy   . Anemia   . Anxiety    past hx   . Arthritis   . Asthma   . Atrial flutter ([place])    past history- not current  . Breast cancer ([place])   . CAD (coronary artery disease) CARDIOLOGIST--  DR Angelena Form   mild non-obstructive cad  . Cancer ([place])    right  . Cataract    bilaterally removed   . Chronic constipation   . Chronic kidney disease    interstitial cystitis  . Concussion    x 3  . COPD (chronic obstructive pulmonary disease) ([place])   . Depression    past hx   . Family history of malignant hyperthermia    father had this  . Fibromyalgia   . Frequency of urination   . GERD (gastroesophageal reflux disease)   . H/O hiatal hernia   . History of basal cell carcinoma excision    X2  . History of breast cancer ONCOLOGIST-- DR Jana Hakim---  NO RECURRANCE   DX 07/2012;  LOW GRADE DCIS  ER+PR+  ----  S/P RIGHT LUMPECTOMY WITH NEGATIVE MARGINS/   RADIATION ENDED 11/2012  . History of chronic bronchitis   . History of colonic polyps   . History of tachycardia    CONTROLLED  WITH ATENOLOL  . Hyperlipidemia   . Hypertension   . Neuromuscular disorder ([facility])    fibromyalgia  . Neuropathy   . Osteoporosis 01/2019   T  score -2.2 stable/improved from prior study  . Pelvic pain   . Personal history of radiation therapy   . S/P radiation therapy 11/12/12 - 12/05/12   right Breast  . Sepsis ([place]) 2014   from UTI   . Sinus headache   . Urgency of urination     She has a past surgical history that includes Nasal sinus surgery (1985); Cardiovascular stress test (06-18-2012  DR Angelena Form); transthoracic echocardiogram (06-24-2012); RIGHT BREAST BX (08-23-2012); REMOVAL VOCAL CORD CYST (FEB 2014); Cardiac catheterization (09-13-2007  DR Lia Foyer); Cardiac catheterization (08-04-2010  [name]); Colonoscopy (09-29-2010); Total abdominal hysterectomy w/ bilateral salpingoophorectomy (1982); Tonsillectomy and adenoidectomy (AGE  38); ORIF RIGHT ANKLE  FX (2006); RIGHT HAND SURGERY (X3  LAST ONE 2009); Cystoscopy with hydrodistension and biopsy (N/A, 03/06/2014); Esophageal manometry (N/A, 04/03/2016); 24 hour ph study (N/A, 04/03/2016); Cystoscopy; Polypectomy; Upper gastrointestinal endoscopy; Extracorporeal shock wave lithotripsy (Left, 02/06/2019); Skin cancer excision; Breast lumpectomy (Right, 10-11-2012); Breast biopsy (Right, 08/23/2012); Breast biopsy (Right, 10/11/2012); and Breast excisional biopsy (Right, 09/04/2013).   Her family history includes Allergic Disorder in her daughter and daughter; Arthritis in her father and paternal uncle; Birth defects in her maternal aunt; Breast cancer in her maternal aunt, paternal aunt, and paternal aunt; Breast cancer (age of onset: 18) in her mother; Colon cancer in her father and mother; Colon polyps in her father; Diabetes in her daughter, father, and mother; Heart disease in her cousin, father, and son; Hyperlipidemia in her father; Hypertension in her father; Irritable bowel syndrome in her son; Pancreatic cancer in her mother; Rectal cancer in her mother; Stroke in her father.She reports that she quit smoking about 15 years ago. Her smoking use included cigarettes. She has a 15.00 pack-year smoking history. She has never used smokeless tobacco. She reports that she does not drink alcohol and does not use drugs.  Current Outpatient Medications on File Prior to Visit  Medication Sig Dispense Refill  . acetaminophen (TYLENOL) 500 MG tablet Take 1,000 mg by mouth every 6 (six) hours as needed for mild pain or headache.     . albuterol (VENTOLIN HFA) 108 (90 Base) MCG/ACT inhaler Inhale 2 puffs into the lungs every 4 (four) hours as needed. 1 each 0  . amLODipine (NORVASC) 10 MG tablet Take 1 tablet (10 mg total) by mouth daily. 90 tablet 3  . aspirin EC 81 MG tablet Take 1 tablet (81 mg total) by mouth daily. 90 tablet 3  . benzonatate (TESSALON PERLES) 100 MG capsule Take 1 capsule  (100 mg total) by mouth 3 (three) times daily as needed. 20 capsule 0  . Cyanocobalamin (VITAMIN B-12) 5000 MCG TBDP Take 1 tablet by mouth daily with breakfast.    . denosumab (PROLIA) 60 MG/ML SOSY injection Inject 60 mg into the skin every 6 (six) months.    . Estradiol 10 MCG TABS vaginal tablet Place 1 tablet (10 mcg total) vaginally 2 (two) times a week. 24 tablet 4  . fluticasone (FLONASE) 50 MCG/ACT nasal spray Use 2 spray(s) in each nostril once daily 16 g 5  . furosemide (LASIX) 20 MG tablet Take 1 tablet (20 mg total) by mouth as needed for edema. 90 tablet 3  . gabapentin (NEURONTIN) 300 MG capsule Take 300 mg by mouth. 2cap in the morning, 2cap at noon, and 3caps at bedtime    . hyoscyamine (LEVSIN SL) 0.125 MG SL tablet Place 1 tablet (0.125 mg total) under the tongue every 4 (four) hours as needed. 30 tablet  0  . loratadine (CLARITIN) 10 MG tablet Take 1 tablet (10 mg total) by mouth daily. 30 tablet 11  . losartan (COZAAR) 100 MG tablet Take 1 tablet by mouth once daily 90 tablet 3  . mupirocin ointment (BACTROBAN) 2 % Apply thin film twice daily. 22 g 0  . nitroGLYCERIN (NITROSTAT) 0.4 MG SL tablet Place 1 tablet (0.4 mg total) under the tongue every 5 (five) minutes as needed for chest pain. 25 tablet 1  . pentosan polysulfate (ELMIRON) 100 MG capsule Take 1 capsule (100 mg total) by mouth 3 (three) times daily. Reported on 01/05/2016 (Patient taking differently: Take 100 mg by mouth 3 (three) times daily with meals as needed. Reported on 01/05/2016) 90 capsule 11  . polyethylene glycol (MIRALAX) 17 g packet Take 17 g by mouth daily. (Patient not taking: Reported on 04/25/2021) 14 each 0  . pramipexole (MIRAPEX) 0.125 MG tablet Take 1-2 tablets (0.125-0.25 mg total) by mouth in the morning and at bedtime. 60 tablet 5  . promethazine-dextromethorphan (PROMETHAZINE-DM) 6.25-15 MG/5ML syrup Take 5 mLs by mouth 3 (three) times daily as needed for cough. 240 mL 0  . REPATHA SURECLICK 759  MG/ML SOAJ INJECT 140 MG SUBCUTANEOUSLY EVERY 14 DAYS 2 mL 2  . traZODone (DESYREL) 100 MG tablet Take 1 tablet (100 mg total) by mouth at bedtime. 90 tablet 1   Current Facility-Administered Medications on File Prior to Visit  Medication Dose Route Frequency Provider Last Rate Last Admin  . bupivacaine (MARCAINE) 0.5 % 10 mL, triamcinolone acetonide (KENALOG-40) 40 mg injection   Subcutaneous Once Carolan Clines, MD      . bupivacaine (MARCAINE) 0.5 % 15 mL, phenazopyridine (PYRIDIUM) 400 mg bladder mixture   Bladder Instillation Once Carolan Clines, MD         Objective:  Objective  Physical Exam Constitutional:      General: She is not in acute distress.    Appearance: Normal appearance. She is not ill-appearing or toxic-appearing.  HENT:     Head: Normocephalic and atraumatic.     Right Ear: Tympanic membrane, ear canal and external ear normal.     Left Ear: Tympanic membrane, ear canal and external ear normal.     Nose: No congestion or rhinorrhea.  Eyes:     Extraocular Movements: Extraocular movements intact.     Pupils: Pupils are equal, round, and reactive to light.  Cardiovascular:     Rate and Rhythm: Normal rate and regular rhythm.     Pulses: Normal pulses.     Heart sounds: Normal heart sounds. No murmur heard.   Pulmonary:     Effort: Pulmonary effort is normal. No respiratory distress.     Breath sounds: Normal breath sounds. No wheezing, rhonchi or rales.  Abdominal:     General: Bowel sounds are normal.     Palpations: Abdomen is soft. There is no mass.     Tenderness: There is no abdominal tenderness. There is no guarding.     Hernia: No hernia is present.  Musculoskeletal:        General: Normal range of motion.     Cervical back: Normal range of motion and neck supple.  Skin:    General: Skin is warm and dry.  Neurological:     Mental Status: She is alert and oriented to person, place, and time.  Psychiatric:        Behavior: Behavior  normal.    BP 102/64   Pulse (!) 58  Temp 98.1 F (36.7 C)   Resp 16   Wt 137 lb (62.1 kg)   SpO2 98%   BMI 24.27 kg/m  Wt Readings from Last 3 Encounters:  05/17/21 137 lb (62.1 kg)  04/26/21 137 lb (62.1 kg)  04/25/21 137 lb (62.1 kg)     Lab Results  Component Value Date   WBC 9.8 03/08/2021   HGB 12.0 03/08/2021   HCT 37.6 03/08/2021   PLT 297.0 03/08/2021   GLUCOSE 87 03/08/2021   CHOL 132 03/08/2021   TRIG 112.0 03/08/2021   HDL 46.50 03/08/2021   LDLDIRECT 117.3 04/08/2012   LDLCALC 63 03/08/2021   ALT 9 03/08/2021   AST 12 03/08/2021   NA 139 03/08/2021   K 4.7 03/08/2021   CL 104 03/08/2021   CREATININE 0.99 03/08/2021   BUN 16 03/08/2021   CO2 29 03/08/2021   TSH 3.67 03/08/2021   INR 1.05 08/03/2010   HGBA1C 5.8 03/08/2021   MICROALBUR 0.2 07/10/2013    DG Chest 2 View  Result Date: 05/13/2021 CLINICAL DATA:  75 year old female with history of cough for the past several weeks. Former smoker. EXAM: CHEST - 2 VIEW COMPARISON:  Chest x-ray 06/10/2020. FINDINGS: Lung volumes are normal. No consolidative airspace disease. No pleural effusions. No pneumothorax. No pulmonary nodule or mass noted. Pulmonary vasculature and the cardiomediastinal silhouette are within normal limits. IMPRESSION: No radiographic evidence of acute cardiopulmonary disease. Electronically Signed   By: Vinnie Langton M.D.   On: 05/13/2021 09:30     Assessment & Plan:  Plan    Meds ordered this encounter  Medications  . famotidine (PEPCID) 40 MG tablet    Sig: Take 1 tablet (40 mg total) by mouth at bedtime.    Dispense:  90 tablet    Refill:  1  . pantoprazole (PROTONIX) 40 MG tablet    Sig: Take 1 tablet (40 mg total) by mouth daily.    Dispense:  90 tablet    Refill:  1  . atenolol (TENORMIN) 50 MG tablet    Sig: Take 1 tablet (50 mg total) by mouth 2 (two) times daily.    Dispense:  180 tablet    Refill:  1  . DULoxetine (CYMBALTA) 60 MG capsule    Sig: Take 2  capsules (120 mg total) by mouth daily.    Dispense:  180 capsule    Refill:  1  . doxycycline (VIBRA-TABS) 100 MG tablet    Sig: Take 1 tablet (100 mg total) by mouth 2 (two) times daily.    Dispense:  20 tablet    Refill:  0  . nystatin cream (MYCOSTATIN)    Sig: Apply 1 application topically 2 (two) times daily.    Dispense:  30 g    Refill:  2    Problem List Items Addressed This Visit    GERD (gastroesophageal reflux disease) (Chronic)    Worse and could be contributing to her persistnet cough over these last few months. Will change her Famotidine to 40 mg qhs and add Pantoprazole 40 mg in am and if no improvement we will refer back to gastroenterology      Relevant Medications   famotidine (PEPCID) 40 MG tablet   pantoprazole (PROTONIX) 40 MG tablet   Essential hypertension    Well controlled, no changes to meds. Encouraged heart healthy diet such as the DASH diet and exercise as tolerated.       Relevant Medications   atenolol (TENORMIN) 50 MG  tablet   Other Relevant Orders   Comprehensive metabolic panel   TOBACCO ABUSE, HX OF    She is enroled in the low dose CT scan surveillance program and her next scan is due in July.       Asthma, allergic    Symptoms worsening after recent illness so she is referred back to pulmonology for further evalaution      Renal insufficiency   Dysphagia    She has had her esophagus stretched recently with GI but symptoms persist.  Will add Protonix and reevaluate      Cough - Primary    Multifactorial, she is referred to ENT given her h/o vocal cord cysts. Increase treatment of her reflux and treat her sinusitis. If no improvement she will also be referred backt o gastroenterology      Relevant Orders   CBC with Differential/Platelet   CMV IgM   Monospot   Ambulatory referral to ENT   Ambulatory referral to Pulmonology   Depression    Doing well, given refill on Duloxetine.       Relevant Medications   DULoxetine  (CYMBALTA) 60 MG capsule   Recurrent sinusitis    She currently has incresaed congestion, subjective fevers. Describes her upper teeth as hurting, increased sinus congestion and thick green/brown sputum noted. She notes malaise and fatigue c/w previous sinus infections. She has had some improvement with Doxycycline will extend treatment and continue to monitor      Relevant Medications   doxycycline (VIBRA-TABS) 100 MG tablet   Statin intolerance    She is tolerating Repatha.       Vitamin B12 deficiency    Supplement and monitor      Vocal cord cyst    Patient reports a history of cysts being removed twice in the past but her ENT doctor has retired so she is referred to a new provider for evaluation due to her worsening cough.       Relevant Orders   Ambulatory referral to ENT   High serum vitamin D    Continue to monitor      Relevant Orders   VITAMIN D 25 Hydroxy (Vit-D Deficiency, Fractures)   Fever   Relevant Orders   CBC with Differential/Platelet   CMV IgM   Monospot      Follow-up: No follow-ups on file.   I,David Hanna,acting as a scribe for [name], MD.,have documented all relevant documentation on the behalf of [name], MD,as directed by  [name], MD while in the presence of [name], MD.  I, Mosie Lukes, MD personally performed the services described in this documentation. All medical record entries made by the scribe were at my direction and in my presence. I have reviewed the chart and agree that the record reflects my personal performance and is accurate and complete

## 2021-05-17 NOTE — Telephone Encounter (Signed)
Patient notified and is scheduled for 1pm

## 2021-05-18 DIAGNOSIS — E559 Vitamin D deficiency, unspecified: Secondary | ICD-10-CM | POA: Insufficient documentation

## 2021-05-18 DIAGNOSIS — R7989 Other specified abnormal findings of blood chemistry: Secondary | ICD-10-CM | POA: Insufficient documentation

## 2021-05-18 DIAGNOSIS — J383 Other diseases of vocal cords: Secondary | ICD-10-CM | POA: Insufficient documentation

## 2021-05-18 DIAGNOSIS — R509 Fever, unspecified: Secondary | ICD-10-CM | POA: Insufficient documentation

## 2021-05-18 HISTORY — DX: Other specified abnormal findings of blood chemistry: R79.89

## 2021-05-18 HISTORY — DX: Other diseases of vocal cords: J38.3

## 2021-05-18 LAB — CBC WITH DIFFERENTIAL/PLATELET
Basophils Absolute: 0.1 10*3/uL (ref 0.0–0.1)
Basophils Relative: 0.9 % (ref 0.0–3.0)
Eosinophils Absolute: 0.1 10*3/uL (ref 0.0–0.7)
Eosinophils Relative: 1 % (ref 0.0–5.0)
HCT: 39.1 % (ref 36.0–46.0)
Hemoglobin: 12.6 g/dL (ref 12.0–15.0)
Lymphocytes Relative: 13.3 % (ref 12.0–46.0)
Lymphs Abs: 1.7 10*3/uL (ref 0.7–4.0)
MCHC: 32.1 g/dL (ref 30.0–36.0)
MCV: 91.8 fl (ref 78.0–100.0)
Monocytes Absolute: 0.8 10*3/uL (ref 0.1–1.0)
Monocytes Relative: 5.9 % (ref 3.0–12.0)
Neutro Abs: 10.1 10*3/uL — ABNORMAL HIGH (ref 1.4–7.7)
Neutrophils Relative %: 78.9 % — ABNORMAL HIGH (ref 43.0–77.0)
Platelets: 335 10*3/uL (ref 150.0–400.0)
RBC: 4.26 Mil/uL (ref 3.87–5.11)
RDW: 16.5 % — ABNORMAL HIGH (ref 11.5–15.5)
WBC: 12.9 10*3/uL — ABNORMAL HIGH (ref 4.0–10.5)

## 2021-05-18 LAB — COMPREHENSIVE METABOLIC PANEL
ALT: 9 U/L (ref 0–35)
AST: 15 U/L (ref 0–37)
Albumin: 3.5 g/dL (ref 3.5–5.2)
Alkaline Phosphatase: 123 U/L — ABNORMAL HIGH (ref 39–117)
BUN: 14 mg/dL (ref 6–23)
CO2: 29 mEq/L (ref 19–32)
Calcium: 8.6 mg/dL (ref 8.4–10.5)
Chloride: 106 mEq/L (ref 96–112)
Creatinine, Ser: 0.92 mg/dL (ref 0.40–1.20)
GFR: 60.75 mL/min (ref >60.00)
Glucose, Bld: 89 mg/dL (ref 70–99)
Potassium: 4.6 mEq/L (ref 3.5–5.1)
Sodium: 141 mEq/L (ref 135–145)
Total Bilirubin: 0.5 mg/dL (ref 0.2–1.2)
Total Protein: 6.1 g/dL (ref 6.0–8.3)

## 2021-05-18 LAB — VITAMIN D 25 HYDROXY (VIT D DEFICIENCY, FRACTURES): VITD: 28.6 ng/mL — ABNORMAL LOW (ref 30.00–100.00)

## 2021-05-18 LAB — MONONUCLEOSIS SCREEN: Mono Screen: NEGATIVE

## 2021-05-18 NOTE — Assessment & Plan Note (Signed)
She is tolerating Repatha.

## 2021-05-18 NOTE — Assessment & Plan Note (Signed)
Patient reports a history of cysts being removed twice in the past but her ENT doctor has retired so she is referred to a new provider for evaluation due to her worsening cough.

## 2021-05-18 NOTE — Assessment & Plan Note (Signed)
Supplement and monitor 

## 2021-05-18 NOTE — Assessment & Plan Note (Signed)
Continue to monitor

## 2021-05-18 NOTE — Assessment & Plan Note (Signed)
Doing well, given refill on Duloxetine.

## 2021-05-18 NOTE — Assessment & Plan Note (Signed)
Multifactorial, she is referred to ENT given her h/o vocal cord cysts. Increase treatment of her reflux and treat her sinusitis. If no improvement she will also be referred backt o gastroenterology

## 2021-05-18 NOTE — Assessment & Plan Note (Signed)
She is enroled in the low dose CT scan surveillance program and her next scan is due in July.

## 2021-05-18 NOTE — Assessment & Plan Note (Signed)
Well controlled, no changes to meds. Encouraged heart healthy diet such as the DASH diet and exercise as tolerated.  °

## 2021-05-18 NOTE — Assessment & Plan Note (Signed)
Symptoms worsening after recent illness so she is referred back to pulmonology for further evalaution

## 2021-05-18 NOTE — Assessment & Plan Note (Signed)
Worse and could be contributing to her persistnet cough over these last few months. Will change her Famotidine to 40 mg qhs and add Pantoprazole 40 mg in am and if no improvement we will refer back to gastroenterology

## 2021-05-18 NOTE — Assessment & Plan Note (Signed)
She has had her esophagus stretched recently with GI but symptoms persist.  Will add Protonix and reevaluate

## 2021-05-18 NOTE — Assessment & Plan Note (Addendum)
She currently has incresaed congestion, subjective fevers. Describes her upper teeth as hurting, increased sinus congestion and thick green/brown sputum noted. She notes malaise and fatigue c/w previous sinus infections. She has had some improvement with Doxycycline will extend treatment and continue to monitor

## 2021-05-20 ENCOUNTER — Other Ambulatory Visit: Payer: Self-pay

## 2021-05-20 DIAGNOSIS — E538 Deficiency of other specified B group vitamins: Secondary | ICD-10-CM

## 2021-05-20 DIAGNOSIS — I10 Essential (primary) hypertension: Secondary | ICD-10-CM

## 2021-05-20 DIAGNOSIS — D72829 Elevated white blood cell count, unspecified: Secondary | ICD-10-CM

## 2021-05-20 LAB — CMV IGM: CMV IgM: <30 [AU]/ml

## 2021-05-31 ENCOUNTER — Ambulatory Visit (INDEPENDENT_AMBULATORY_CARE_PROVIDER_SITE_OTHER): Payer: Medicare Other | Admitting: Family

## 2021-05-31 ENCOUNTER — Other Ambulatory Visit: Payer: Self-pay

## 2021-05-31 VITALS — BP 120/54 | HR 80 | Temp 98.0°F | Resp 16 | Ht 64.0 in | Wt 136.6 lb

## 2021-05-31 DIAGNOSIS — H109 Unspecified conjunctivitis: Secondary | ICD-10-CM | POA: Insufficient documentation

## 2021-05-31 DIAGNOSIS — R053 Chronic cough: Secondary | ICD-10-CM | POA: Diagnosis not present

## 2021-05-31 MED ORDER — PREDNISONE 10 MG PO TABS: ORAL_TABLET | ORAL | 0 refills | Status: DC

## 2021-05-31 MED ORDER — TOBRAMYCIN-DEXAMETHASONE 0.3-0.1 % OP SUSP: 1.0000 [drp] | Freq: Four times a day (QID) | OPHTHALMIC | 0 refills | Status: AC

## 2021-05-31 NOTE — Assessment & Plan Note (Addendum)
She has not responded to antibiotics for her sinus pressure or her cough. I think that there is likely a reactive component and that she would benefit from a steroid taper for both cough and sinus. She has not heard back yet about her ENT referral or her pulmonology referral placed last visit by PCP. I gave her both numbers to call and get an appointment. In the meantime, she will let me know if she has any reaction to the prednisone (I took it off her allergy list- see discussion above), or if her symptoms fail to improve.  Pt verbalizes understanding.

## 2021-05-31 NOTE — Progress Notes (Signed)
Acute Office Visit  Subjective:    Patient ID: [name], female    DOB: 1946-11-05, 75 y.o.   MRN: [id number]  Chief Complaint  Patient presents with  . Cough    Persistent cough for weeks.   . Facial Pain    Complains of sinus pain    HPI Patient is in today for   Reports that she developed a cough 7 weeks ago.  She then developed sinus drainage about 5 weeks ago.  She was treated with antibiotic and had a chest x-ray. Reports that symptoms did not return, so she got a refill on her doxycycline.  Reports that She continues to cough. She also reports + fatigue. She reports that she has daily GERD symptoms, despite addition of protonix on 05/19/21.    Reports that she had a stye on her right eye recently which has resolved.  Has been waking up with matted shut eyes.    She reports that she had cortisone shots and oral steroids 3 years ago and tolerated without difficulty.  They are both listed on her allergy list but she tells me that she did not have any new symptoms after using the steroid medication- only the hives that she was prescribed the medication for. She feels that she does not have a true allergy to steroids.    Past Medical History:  Diagnosis Date  . Allergy   . Anemia   . Anxiety    past hx   . Arthritis   . Asthma   . Atrial flutter ([place])    past history- not current  . Breast cancer ([place])   . CAD (coronary artery disease) CARDIOLOGIST--  DR Angelena Form   mild non-obstructive cad  . Cancer ([place])    right  . Cataract    bilaterally removed   . Chronic constipation   . Chronic kidney disease    interstitial cystitis  . Concussion    x 3  . COPD (chronic obstructive pulmonary disease) ([place])   . Depression    past hx   . Family history of malignant hyperthermia    father had this  . Fibromyalgia   . Frequency of urination   . GERD (gastroesophageal reflux disease)   . H/O hiatal hernia   . History of basal cell carcinoma excision    X2  . History  of breast cancer ONCOLOGIST-- DR Jana Hakim---  NO RECURRANCE   DX 07/2012;  LOW GRADE DCIS  ER+PR+  ----  S/P RIGHT LUMPECTOMY WITH NEGATIVE MARGINS/   RADIATION ENDED 11/2012  . History of chronic bronchitis   . History of colonic polyps   . History of tachycardia    CONTROLLED  WITH ATENOLOL  . Hyperlipidemia   . Hypertension   . Neuromuscular disorder ([facility])    fibromyalgia  . Neuropathy   . Osteoporosis 01/2019   T score -2.2 stable/improved from prior study  . Pelvic pain   . Personal history of radiation therapy   . S/P radiation therapy 11/12/12 - 12/05/12   right Breast  . Sepsis ([place]) 2014   from UTI   . Sinus headache   . Urgency of urination     Past Surgical History:  Procedure Laterality Date  . [place] STUDY N/A 04/03/2016   Procedure: [place] STUDY;  Surgeon: Manus Gunning, MD;  Location: [facility];  Service: Gastroenterology;  Laterality: N/A;  . BREAST BIOPSY Right 08/23/2012   ADH  . BREAST BIOPSY  Right 10/11/2012   Ductal Carcinoma  . BREAST EXCISIONAL BIOPSY Right 09/04/2013   benign  . BREAST LUMPECTOMY Right 10-11-2012   W/ SLN BX  . CARDIAC CATHETERIZATION  09-13-2007  DR Lia Foyer   WELL-PRESERVED LVF/  DIFFUSE SCATTERED CORONARY CALCIFACATION AND ATHEROSCLEROSIS WITHOUT OBSTRUCTION  . CARDIAC CATHETERIZATION  08-04-2010  DR Angelena Form   NON-OBSTRUCTIVE CAD/  pLAD 40%/  oLAD 30%/  mLAD 30%/  pRCA 30%/  EF 60%  . CARDIOVASCULAR STRESS TEST  06-18-2012  DR McALHANY   LOW RISK NUCLEAR STUDY/  SMALL FIXED AREA OF MODERATELY DECREASED UPTAKE IN ANTEROSEPTAL WALL WHICH MAY BE ARTIFACTUAL/  NO ISCHEMIA/  EF 68%  . COLONOSCOPY  09-29-2010  . CYSTOSCOPY    . CYSTOSCOPY WITH HYDRODISTENSION AND BIOPSY N/A 03/06/2014   Procedure: CYSTOSCOPY/HYDRODISTENSION/ INSTILATION OF MARCAINE AND PYRIDIUM;  Surgeon: Ailene Rud, MD;  Location: [facility];  Service: Urology;  Laterality: N/A;  . ESOPHAGEAL MANOMETRY N/A 04/03/2016    Procedure: ESOPHAGEAL MANOMETRY (EM);  Surgeon: Manus Gunning, MD;  Location: [facility];  Service: Gastroenterology;  Laterality: N/A;  . EXTRACORPOREAL SHOCK WAVE LITHOTRIPSY Left 02/06/2019   Procedure: EXTRACORPOREAL SHOCK WAVE LITHOTRIPSY (ESWL);  Surgeon: Kathie Rhodes, MD;  Location: [facility];  Service: Urology;  Laterality: Left;  . NASAL SINUS SURGERY  1985  . ORIF RIGHT ANKLE  FX  2006  . POLYPECTOMY    . REMOVAL VOCAL CORD CYST  FEB 2014  . RIGHT BREAST BX  08-23-2012  . RIGHT HAND SURGERY  X3  LAST ONE 2009   INCLUDES  ORIF RIGHT 5TH FINGER AND REVISION TWICE  . SKIN CANCER EXCISION    . TONSILLECTOMY AND ADENOIDECTOMY  AGE 65  . TOTAL ABDOMINAL HYSTERECTOMY W/ BILATERAL SALPINGOOPHORECTOMY  1982   W/  APPENDECTOMY  . TRANSTHORACIC ECHOCARDIOGRAM  06-24-2012   GRADE I DIASTOLIC DYSFUNCTION/  EF 55-60%/  MILD MR  . UPPER GASTROINTESTINAL ENDOSCOPY      Family History  Problem Relation Age of Onset  . Rectal cancer Mother   . Colon cancer Mother   . Pancreatic cancer Mother   . Diabetes Mother   . Breast cancer Mother 68  . Breast cancer Maternal Aunt        breast  . Birth defects Maternal Aunt   . Irritable bowel syndrome Son   . Heart disease Son        CAD, MV replacement  . Allergic Disorder Daughter   . Diabetes Daughter   . Colon cancer Father   . Colon polyps Father   . Diabetes Father   . Stroke Father   . Heart disease Father        CHF  . Hyperlipidemia Father   . Hypertension Father   . Arthritis Father   . Breast cancer Paternal Aunt   . Breast cancer Paternal Aunt   . Arthritis Paternal Uncle   . Allergic Disorder Daughter   . Heart disease Cousin        CAD, had a blood clot after stenting  . Esophageal cancer Neg Hx   . Stomach cancer Neg Hx     Social History   Socioeconomic History  . Marital status: Married    Spouse name: Jori Moll   . Number of children: 3  . Years of education: BA  . Highest education level: Not on file   Occupational History  . Occupation: Retired Programmer, multimedia: RETIRED  Tobacco Use  . Smoking status: Former  Smoker    Packs/day: 1.00    Years: 15.00    Pack years: 15.00    Types: Cigarettes    Quit date: 05/27/2005    Years since quitting: 16.0  . Smokeless tobacco: Never Used  Vaping Use  . Vaping Use: Never used  Substance and Sexual Activity  . Alcohol use: No    Alcohol/week: 0.0 standard drinks  . Drug use: No  . Sexual activity: Yes    Partners: Male    Comment: 1st intercourse 75 yo-Fewer than 5 partners  Other Topics Concern  . Not on file  Social History Narrative   Lives with husband.   Caffeine use: 1/2 cup per day   Exercise-- 2days a week [employer],  Water aerobics, walking       Retired Marine scientist   No dietary restrictions, tries to maintain a heart healthy diet   Social Determinants of Radio broadcast assistant Strain: Not on file  Food Insecurity: Not on file  Transportation Needs: Not on file  Physical Activity: Not on file  Stress: Not on file  Social Connections: Not on file  Intimate Partner Violence: Not on file    Outpatient Medications Prior to Visit  Medication Sig Dispense Refill  . acetaminophen (TYLENOL) 500 MG tablet Take 1,000 mg by mouth every 6 (six) hours as needed for mild pain or headache.     . albuterol (VENTOLIN HFA) 108 (90 Base) MCG/ACT inhaler Inhale 2 puffs into the lungs every 4 (four) hours as needed. 1 each 0  . amLODipine (NORVASC) 10 MG tablet Take 1 tablet (10 mg total) by mouth daily. 90 tablet 3  . aspirin EC 81 MG tablet Take 1 tablet (81 mg total) by mouth daily. 90 tablet 3  . atenolol (TENORMIN) 50 MG tablet Take 1 tablet (50 mg total) by mouth 2 (two) times daily. 180 tablet 1  . benzonatate (TESSALON PERLES) 100 MG capsule Take 1 capsule (100 mg total) by mouth 3 (three) times daily as needed. 20 capsule 0  . Cyanocobalamin (VITAMIN B-12) 5000 MCG TBDP Take 1 tablet by mouth daily with breakfast.    . denosumab  (PROLIA) 60 MG/ML SOSY injection Inject 60 mg into the skin every 6 (six) months.    . DULoxetine (CYMBALTA) 60 MG capsule Take 2 capsules (120 mg total) by mouth daily. 180 capsule 1  . Estradiol 10 MCG TABS vaginal tablet Place 1 tablet (10 mcg total) vaginally 2 (two) times a week. 24 tablet 4  . famotidine (PEPCID) 40 MG tablet Take 1 tablet (40 mg total) by mouth at bedtime. 90 tablet 1  . fluticasone (FLONASE) 50 MCG/ACT nasal spray Use 2 spray(s) in each nostril once daily 16 g 5  . furosemide (LASIX) 20 MG tablet Take 1 tablet (20 mg total) by mouth as needed for edema. 90 tablet 3  . gabapentin (NEURONTIN) 300 MG capsule Take 300 mg by mouth. 2cap in the morning, 2cap at noon, and 3caps at bedtime    . hyoscyamine (LEVSIN SL) 0.125 MG SL tablet Place 1 tablet (0.125 mg total) under the tongue every 4 (four) hours as needed. 30 tablet 0  . loratadine (CLARITIN) 10 MG tablet Take 1 tablet (10 mg total) by mouth daily. 30 tablet 11  . losartan (COZAAR) 100 MG tablet Take 1 tablet by mouth once daily 90 tablet 3  . mupirocin ointment (BACTROBAN) 2 % Apply thin film twice daily. 22 g 0  . nitroGLYCERIN (NITROSTAT) 0.4 MG  SL tablet Place 1 tablet (0.4 mg total) under the tongue every 5 (five) minutes as needed for chest pain. 25 tablet 1  . nystatin cream (MYCOSTATIN) Apply 1 application topically 2 (two) times daily. 30 g 2  . pantoprazole (PROTONIX) 40 MG tablet Take 1 tablet (40 mg total) by mouth daily. 90 tablet 1  . pentosan polysulfate (ELMIRON) 100 MG capsule Take 1 capsule (100 mg total) by mouth 3 (three) times daily. Reported on 01/05/2016 (Patient taking differently: Take 100 mg by mouth 3 (three) times daily with meals as needed. Reported on 01/05/2016) 90 capsule 11  . polyethylene glycol (MIRALAX) 17 g packet Take 17 g by mouth daily. (Patient not taking: Reported on 04/25/2021) 14 each 0  . pramipexole (MIRAPEX) 0.125 MG tablet Take 1-2 tablets (0.125-0.25 mg total) by mouth in the  morning and at bedtime. 60 tablet 5  . promethazine-dextromethorphan (PROMETHAZINE-DM) 6.25-15 MG/5ML syrup Take 5 mLs by mouth 3 (three) times daily as needed for cough. 240 mL 0  . REPATHA SURECLICK 093 MG/ML SOAJ INJECT 140 MG SUBCUTANEOUSLY EVERY 14 DAYS 2 mL 2  . traZODone (DESYREL) 100 MG tablet Take 1 tablet (100 mg total) by mouth at bedtime. 90 tablet 1  . doxycycline (VIBRA-TABS) 100 MG tablet Take 1 tablet (100 mg total) by mouth 2 (two) times daily. 20 tablet 0   Facility-Administered Medications Prior to Visit  Medication Dose Route Frequency Provider Last Rate Last Admin  . bupivacaine (MARCAINE) 0.5 % 10 mL, triamcinolone acetonide (KENALOG-40) 40 mg injection   Subcutaneous Once Carolan Clines, MD      . bupivacaine (MARCAINE) 0.5 % 15 mL, phenazopyridine (PYRIDIUM) 400 mg bladder mixture   Bladder Instillation Once Carolan Clines, MD        Allergies  Allergen Reactions  . Clindamycin Rash and Shortness Of Breath  . Clindamycin Hcl Shortness Of Breath and Rash  . Penicillins Anaphylaxis  . Rosuvastatin Anaphylaxis  . Baclofen Other (See Comments) and Rash  . Lincomycin Other (See Comments)  . Clindamycin Hcl     Other reaction(s): Shortness of Breath, Rash  . Codeine Hives and Other (See Comments)    headache Other reaction(s): Headache, Nausea, headache  . Erythromycin Hives  . Erythromycin Base Other (See Comments)    other  . Fluzone [Influenza Virus Vaccine] Other (See Comments)    Local reaction at the site  . Haemophilus Influenzae Other (See Comments)    Local reaction at the site Local reaction at the site  . Haemophilus Influenzae Vaccines Other (See Comments)    Local reaction at site  . Latex Hives  . Other Other (See Comments)    Local reaction at site  . Pentazocine Other (See Comments)  . Pentazocine Lactate Other (See Comments)    HALLUCINATION  . Pneumococcal Vaccine Hives and Swelling    Other reaction(s): Hives, Shortness of  Breath  . Pneumococcal Vaccine Polyvalent Hives, Swelling and Other (See Comments)    REACTION: redness, swelling, and hives at injection site  . Tamoxifen Nausea And Vomiting and Other (See Comments)    HEADACHE Other reaction(s): Headache, Nausea    Review of Systems    see HPI  Objective:    Physical Exam Constitutional:      Appearance: Normal appearance.  HENT:     Right Ear: Tympanic membrane and ear canal normal.     Left Ear: Tympanic membrane and ear canal normal.     Nose:     Right Sinus:  Maxillary sinus tenderness and frontal sinus tenderness present.     Left Sinus: Maxillary sinus tenderness and frontal sinus tenderness present.  Eyes:     Extraocular Movements: Extraocular movements intact.     Conjunctiva/sclera:     Right eye: Right conjunctiva is not injected. No exudate.    Left eye: Left conjunctiva is not injected. No exudate. Neck:     Thyroid: No thyromegaly.  Cardiovascular:     Rate and Rhythm: Regular rhythm.  Pulmonary:     Effort: Pulmonary effort is normal.  Musculoskeletal:        General: No swelling.  Lymphadenopathy:     Cervical: No cervical adenopathy.  Neurological:     Mental Status: She is oriented to person, place, and time.     BP (!) 120/54 (BP Location: Left Arm, Patient Position: Sitting, Cuff Size: Small)   Pulse 80   Temp 98 F (36.7 C) (Oral)   Resp 16   Ht 5\' 4"  (1.626 m)   Wt 136 lb 9.6 oz (62 kg)   SpO2 100%   BMI 23.45 kg/m  Wt Readings from Last 3 Encounters:  05/31/21 136 lb 9.6 oz (62 kg)  05/17/21 137 lb (62.1 kg)  04/26/21 137 lb (62.1 kg)       Assessment & Plan:   Problem List Items Addressed This Visit      Unprioritized   Conjunctivitis    Normal eye exam but hx consistent with conjunctivitis. Will give trial of Tobradex.        Chronic cough - Primary    She has not responded to antibiotics for her sinus pressure or her cough. I think that there is likely a reactive component and that she  would benefit from a steroid taper for both cough and sinus. She has not heard back yet about her ENT referral or her pulmonology referral placed last visit by PCP. I gave her both numbers to call and get an appointment. In the meantime, she will let me know if she has any reaction to the prednisone (I took it off her allergy list- see discussion above), or if her symptoms fail to improve.  Pt verbalizes understanding.           Meds ordered this encounter  Medications  . predniSONE (DELTASONE) 10 MG tablet    Sig: 4 tabs by mouth once daily for 2 days, then 3 tabs daily x 2 days, then 2 tabs daily x 2 days, then 1 tab daily x 2 days    Dispense:  20 tablet    Refill:  0    Order Specific Question:   Supervising Provider    Answer:   [name] A [[id number]]  . tobramycin-dexamethasone (TOBRADEX) ophthalmic solution    Sig: Place 1 drop into both eyes every 6 (six) hours for 7 days.    Dispense:  5 mL    Refill:  0    Order Specific Question:   Supervising Provider    Answer:   [name] A [[id number]]     Nance Pear, NP

## 2021-05-31 NOTE — Assessment & Plan Note (Signed)
Normal eye exam but hx consistent with conjunctivitis. Will give trial of Tobradex.

## 2021-05-31 NOTE — Patient Instructions (Addendum)
Please Call Dr. Deeann Saint office to schedule an appointment- [phone].  Please call Pulmonology to schedule your appointment [phone]. Please begin prednisone and eye drops. Call if your symptoms worsen or if not improved in 2-3 days.

## 2021-06-02 DIAGNOSIS — G894 Chronic pain syndrome: Secondary | ICD-10-CM | POA: Diagnosis not present

## 2021-06-03 ENCOUNTER — Telehealth: Payer: Self-pay | Admitting: Family Medicine

## 2021-06-03 ENCOUNTER — Other Ambulatory Visit: Payer: Self-pay

## 2021-06-03 ENCOUNTER — Other Ambulatory Visit (INDEPENDENT_AMBULATORY_CARE_PROVIDER_SITE_OTHER): Payer: Medicare Other

## 2021-06-03 ENCOUNTER — Other Ambulatory Visit: Payer: Self-pay | Admitting: Family Medicine

## 2021-06-03 DIAGNOSIS — I10 Essential (primary) hypertension: Secondary | ICD-10-CM | POA: Diagnosis not present

## 2021-06-03 DIAGNOSIS — D72829 Elevated white blood cell count, unspecified: Secondary | ICD-10-CM

## 2021-06-03 DIAGNOSIS — E538 Deficiency of other specified B group vitamins: Secondary | ICD-10-CM | POA: Diagnosis not present

## 2021-06-03 LAB — CBC WITH DIFFERENTIAL/PLATELET
Basophils Absolute: 0.1 10*3/uL (ref 0.0–0.1)
Basophils Relative: 0.7 % (ref 0.0–3.0)
Eosinophils Absolute: 0 10*3/uL (ref 0.0–0.7)
Eosinophils Relative: 0 % (ref 0.0–5.0)
HCT: 35.3 % — ABNORMAL LOW (ref 36.0–46.0)
Hemoglobin: 11.4 g/dL — ABNORMAL LOW (ref 12.0–15.0)
Lymphocytes Relative: 7.7 % — ABNORMAL LOW (ref 12.0–46.0)
Lymphs Abs: 1.4 10*3/uL (ref 0.7–4.0)
MCHC: 32.3 g/dL (ref 30.0–36.0)
MCV: 91.2 fl (ref 78.0–100.0)
Monocytes Absolute: 0.9 10*3/uL (ref 0.1–1.0)
Monocytes Relative: 4.8 % (ref 3.0–12.0)
Neutro Abs: 15.5 10*3/uL — ABNORMAL HIGH (ref 1.4–7.7)
Neutrophils Relative %: 86.8 % — ABNORMAL HIGH (ref 43.0–77.0)
Platelets: 382 10*3/uL (ref 150.0–400.0)
RBC: 3.87 Mil/uL (ref 3.87–5.11)
RDW: 16.6 % — ABNORMAL HIGH (ref 11.5–15.5)
WBC: 17.8 10*3/uL — ABNORMAL HIGH (ref 4.0–10.5)

## 2021-06-03 LAB — COMPREHENSIVE METABOLIC PANEL
ALT: 10 U/L (ref 0–35)
AST: 12 U/L (ref 0–37)
Albumin: 3.7 g/dL (ref 3.5–5.2)
Alkaline Phosphatase: 101 U/L (ref 39–117)
BUN: 18 mg/dL (ref 6–23)
CO2: 28 mEq/L (ref 19–32)
Calcium: 10 mg/dL (ref 8.4–10.5)
Chloride: 107 mEq/L (ref 96–112)
Creatinine, Ser: 1.26 mg/dL — ABNORMAL HIGH (ref 0.40–1.20)
GFR: 41.64 mL/min — ABNORMAL LOW (ref >60.00)
Glucose, Bld: 91 mg/dL (ref 70–99)
Potassium: 4.8 mEq/L (ref 3.5–5.1)
Sodium: 142 mEq/L (ref 135–145)
Total Bilirubin: 0.4 mg/dL (ref 0.2–1.2)
Total Protein: 6.2 g/dL (ref 6.0–8.3)

## 2021-06-03 MED ORDER — POLYMYXIN B-TRIMETHOPRIM 10000-0.1 UNIT/ML-% OP SOLN: 1.0000 [drp] | Freq: Four times a day (QID) | OPHTHALMIC | 0 refills | Status: DC

## 2021-06-03 NOTE — Telephone Encounter (Signed)
Pt aware.

## 2021-06-03 NOTE — Telephone Encounter (Signed)
Pt,stated the pharmacy called her  about her  prescription she was giving on 06/02/21 Tobramycin-dexamethasone, pt said she is allergic to prednisone and Tobramycin has  prednisone in it she would like to know what can she do now    Please advice

## 2021-06-07 ENCOUNTER — Ambulatory Visit (INDEPENDENT_AMBULATORY_CARE_PROVIDER_SITE_OTHER): Payer: Medicare Other | Admitting: Family Medicine

## 2021-06-07 ENCOUNTER — Encounter: Payer: Self-pay | Admitting: Family Medicine

## 2021-06-07 ENCOUNTER — Other Ambulatory Visit: Payer: Self-pay

## 2021-06-07 VITALS — BP 125/68 | HR 80 | Temp 98.1°F | Resp 12 | Ht 63.0 in | Wt 137.6 lb

## 2021-06-07 DIAGNOSIS — R194 Change in bowel habit: Secondary | ICD-10-CM

## 2021-06-07 DIAGNOSIS — R739 Hyperglycemia, unspecified: Secondary | ICD-10-CM

## 2021-06-07 DIAGNOSIS — K2100 Gastro-esophageal reflux disease with esophagitis, without bleeding: Secondary | ICD-10-CM | POA: Diagnosis not present

## 2021-06-07 DIAGNOSIS — I10 Essential (primary) hypertension: Secondary | ICD-10-CM

## 2021-06-07 DIAGNOSIS — J309 Allergic rhinitis, unspecified: Secondary | ICD-10-CM

## 2021-06-07 DIAGNOSIS — D72829 Elevated white blood cell count, unspecified: Secondary | ICD-10-CM

## 2021-06-07 DIAGNOSIS — K625 Hemorrhage of anus and rectum: Secondary | ICD-10-CM | POA: Diagnosis not present

## 2021-06-07 DIAGNOSIS — R053 Chronic cough: Secondary | ICD-10-CM

## 2021-06-07 DIAGNOSIS — R7989 Other specified abnormal findings of blood chemistry: Secondary | ICD-10-CM

## 2021-06-07 LAB — CBC WITH DIFFERENTIAL/PLATELET
Basophils Absolute: 0.1 10*3/uL (ref 0.0–0.1)
Basophils Relative: 0.5 % (ref 0.0–3.0)
Eosinophils Absolute: 0.2 10*3/uL (ref 0.0–0.7)
Eosinophils Relative: 1.5 % (ref 0.0–5.0)
HCT: 37.8 % (ref 36.0–46.0)
Hemoglobin: 12.1 g/dL (ref 12.0–15.0)
Lymphocytes Relative: 16.3 % (ref 12.0–46.0)
Lymphs Abs: 1.9 10*3/uL (ref 0.7–4.0)
MCHC: 32.1 g/dL (ref 30.0–36.0)
MCV: 91.1 fl (ref 78.0–100.0)
Monocytes Absolute: 0.8 10*3/uL (ref 0.1–1.0)
Monocytes Relative: 6.9 % (ref 3.0–12.0)
Neutro Abs: 8.8 10*3/uL — ABNORMAL HIGH (ref 1.4–7.7)
Neutrophils Relative %: 74.8 % (ref 43.0–77.0)
Platelets: 319 10*3/uL (ref 150.0–400.0)
RBC: 4.15 Mil/uL (ref 3.87–5.11)
RDW: 16.8 % — ABNORMAL HIGH (ref 11.5–15.5)
WBC: 11.8 10*3/uL — ABNORMAL HIGH (ref 4.0–10.5)

## 2021-06-07 MED ORDER — SULFAMETHOXAZOLE-TRIMETHOPRIM 800-160 MG PO TABS: 1.0000 | ORAL_TABLET | Freq: Two times a day (BID) | ORAL | 0 refills | Status: DC

## 2021-06-07 MED ORDER — METHYLPREDNISOLONE 4 MG PO TABS: ORAL_TABLET | ORAL | 0 refills | Status: DC

## 2021-06-07 MED ORDER — SUCRALFATE 1 GM/10ML PO SUSP: 1.0000 g | Freq: Three times a day (TID) | ORAL | 1 refills | Status: DC

## 2021-06-07 NOTE — Progress Notes (Signed)
Patient ID: [name], female    DOB: 1946-09-05  Age: 75 y.o. MRN: [id number]    Subjective:  Subjective  HPI [name] presents for office visit today for follow up on a persistent cough and BM troubles. She reports that she has been experiencing several episodes of blood in the stool that have been intermittent. She states that she has experienced an episode 2 months ago. She denies any chest pain, SOB, fever, abdominal pain,  chills, sore throat, dysuria, urinary incontinence, back pain, HA, or N/VD.    Review of Systems  Constitutional:  Positive for fatigue. Negative for chills and fever.  HENT:  Positive for sinus pressure and sinus pain. Negative for congestion, rhinorrhea and sore throat.        (+) TMJ strain  Eyes:  Positive for pain.  Respiratory:  Positive for cough. Negative for shortness of breath.   Cardiovascular:  Negative for chest pain, palpitations and leg swelling.  Gastrointestinal:  Positive for blood in stool. Negative for abdominal pain, diarrhea, nausea and vomiting.  Genitourinary:  Negative for decreased urine volume, flank pain, frequency, vaginal bleeding and vaginal discharge.  Musculoskeletal:  Negative for back pain.  Neurological:  Negative for headaches.   History Past Medical History:  Diagnosis Date   Allergy    Anemia    Anxiety    past hx    Arthritis    Asthma    Atrial flutter ([place])    past history- not current   Breast cancer ([place])    CAD (coronary artery disease) CARDIOLOGIST--  DR Angelena Form   mild non-obstructive cad   Cancer ([employer])    right   Cataract    bilaterally removed    Chronic constipation    Chronic kidney disease    interstitial cystitis   Concussion    x 3   COPD (chronic obstructive pulmonary disease) ([place])    Depression    past hx    Family history of malignant hyperthermia    father had this   Fibromyalgia    Frequency of urination    GERD (gastroesophageal reflux disease)    H/O hiatal hernia     History of basal cell carcinoma excision    X2   History of breast cancer ONCOLOGIST-- DR Jana Hakim---  NO RECURRANCE   DX 07/2012;  LOW GRADE DCIS  ER+PR+  ----  S/P RIGHT LUMPECTOMY WITH NEGATIVE MARGINS/   RADIATION ENDED 11/2012   History of chronic bronchitis    History of colonic polyps    History of tachycardia    CONTROLLED  WITH ATENOLOL   Hyperlipidemia    Hypertension    Neuromuscular disorder ([facility])    fibromyalgia   Neuropathy    Osteoporosis 01/2019   T score -2.2 stable/improved from prior study   Pelvic pain    Personal history of radiation therapy    S/P radiation therapy 11/12/12 - 12/05/12   right Breast   Sepsis ([place]) 2014   from UTI    Sinus headache    Urgency of urination     She has a past surgical history that includes Nasal sinus surgery (1985); Cardiovascular stress test (06-18-2012  DR Angelena Form); transthoracic echocardiogram (06-24-2012); RIGHT BREAST BX (08-23-2012); REMOVAL VOCAL CORD CYST (FEB 2014); Cardiac catheterization (09-13-2007  DR Lia Foyer); Cardiac catheterization (08-04-2010  DR [facility]); Colonoscopy (09-29-2010); Total abdominal hysterectomy w/ bilateral salpingoophorectomy (1982); Tonsillectomy and adenoidectomy (AGE 46); ORIF RIGHT ANKLE  FX (2006); RIGHT HAND SURGERY (X3  LAST  ONE 2009); Cystoscopy with hydrodistension and biopsy (N/A, 03/06/2014); Esophageal manometry (N/A, 04/03/2016); 24 hour ph study (N/A, 04/03/2016); Cystoscopy; Polypectomy; Upper gastrointestinal endoscopy; Extracorporeal shock wave lithotripsy (Left, 02/06/2019); Skin cancer excision; Breast lumpectomy (Right, 10-11-2012); Breast biopsy (Right, 08/23/2012); Breast biopsy (Right, 10/11/2012); and Breast excisional biopsy (Right, 09/04/2013).   Her family history includes Allergic Disorder in her daughter and daughter; Arthritis in her father and paternal uncle; Birth defects in her maternal aunt; Breast cancer in her maternal aunt, paternal aunt, and paternal aunt; Breast cancer  (age of onset: 55) in her mother; Colon cancer in her father and mother; Colon polyps in her father; Diabetes in her daughter, father, and mother; Heart disease in her cousin, father, and son; Hyperlipidemia in her father; Hypertension in her father; Irritable bowel syndrome in her son; Pancreatic cancer in her mother; Rectal cancer in her mother; Stroke in her father.She reports that she quit smoking about 16 years ago. Her smoking use included cigarettes. She has a 15.00 pack-year smoking history. She has never used smokeless tobacco. She reports that she does not drink alcohol and does not use drugs.  Current Outpatient Medications on File Prior to Visit  Medication Sig Dispense Refill   acetaminophen (TYLENOL) 500 MG tablet Take 1,000 mg by mouth every 6 (six) hours as needed for mild pain or headache.      albuterol (VENTOLIN HFA) 108 (90 Base) MCG/ACT inhaler Inhale 2 puffs into the lungs every 4 (four) hours as needed. 1 each 0   amLODipine (NORVASC) 10 MG tablet Take 1 tablet (10 mg total) by mouth daily. 90 tablet 3   aspirin EC 81 MG tablet Take 1 tablet (81 mg total) by mouth daily. 90 tablet 3   atenolol (TENORMIN) 50 MG tablet Take 1 tablet (50 mg total) by mouth 2 (two) times daily. 180 tablet 1   benzonatate (TESSALON PERLES) 100 MG capsule Take 1 capsule (100 mg total) by mouth 3 (three) times daily as needed. 20 capsule 0   Cyanocobalamin (VITAMIN B-12) 5000 MCG TBDP Take 1 tablet by mouth daily with breakfast.     denosumab (PROLIA) 60 MG/ML SOSY injection Inject 60 mg into the skin every 6 (six) months.     DULoxetine (CYMBALTA) 60 MG capsule Take 2 capsules (120 mg total) by mouth daily. 180 capsule 1   Estradiol 10 MCG TABS vaginal tablet Place 1 tablet (10 mcg total) vaginally 2 (two) times a week. 24 tablet 4   famotidine (PEPCID) 40 MG tablet Take 1 tablet (40 mg total) by mouth at bedtime. 90 tablet 1   fluticasone (FLONASE) 50 MCG/ACT nasal spray Use 2 spray(s) in each  nostril once daily 16 g 5   furosemide (LASIX) 20 MG tablet Take 1 tablet (20 mg total) by mouth as needed for edema. 90 tablet 3   gabapentin (NEURONTIN) 300 MG capsule Take 300 mg by mouth. 2cap in the morning, 2cap at noon, and 3caps at bedtime     hyoscyamine (LEVSIN SL) 0.125 MG SL tablet Place 1 tablet (0.125 mg total) under the tongue every 4 (four) hours as needed. 30 tablet 0   loratadine (CLARITIN) 10 MG tablet Take 1 tablet (10 mg total) by mouth daily. 30 tablet 11   losartan (COZAAR) 100 MG tablet Take 1 tablet by mouth once daily 90 tablet 3   mupirocin ointment (BACTROBAN) 2 % Apply thin film twice daily. 22 g 0   nitroGLYCERIN (NITROSTAT) 0.4 MG SL tablet Place 1 tablet (0.4  mg total) under the tongue every 5 (five) minutes as needed for chest pain. 25 tablet 1   nystatin cream (MYCOSTATIN) Apply 1 application topically 2 (two) times daily. 30 g 2   pantoprazole (PROTONIX) 40 MG tablet Take 1 tablet (40 mg total) by mouth daily. 90 tablet 1   pentosan polysulfate (ELMIRON) 100 MG capsule Take 1 capsule (100 mg total) by mouth 3 (three) times daily. Reported on 01/05/2016 (Patient taking differently: Take 100 mg by mouth 3 (three) times daily with meals as needed. Reported on 01/05/2016) 90 capsule 11   polyethylene glycol (MIRALAX) 17 g packet Take 17 g by mouth daily. 14 each 0   pramipexole (MIRAPEX) 0.125 MG tablet Take 1-2 tablets (0.125-0.25 mg total) by mouth in the morning and at bedtime. 60 tablet 5   REPATHA SURECLICK 454 MG/ML SOAJ INJECT 140 MG SUBCUTANEOUSLY EVERY 14 DAYS 2 mL 2   traZODone (DESYREL) 100 MG tablet Take 1 tablet (100 mg total) by mouth at bedtime. 90 tablet 1   trimethoprim-polymyxin b (POLYTRIM) ophthalmic solution Place 1 drop into both eyes every 6 (six) hours. 10 mL 0   predniSONE (DELTASONE) 10 MG tablet 4 tabs by mouth once daily for 2 days, then 3 tabs daily x 2 days, then 2 tabs daily x 2 days, then 1 tab daily x 2 days (Patient not taking: Reported  on 06/07/2021) 20 tablet 0   promethazine-dextromethorphan (PROMETHAZINE-DM) 6.25-15 MG/5ML syrup Take 5 mLs by mouth 3 (three) times daily as needed for cough. (Patient not taking: Reported on 06/07/2021) 240 mL 0   Current Facility-Administered Medications on File Prior to Visit  Medication Dose Route Frequency Provider Last Rate Last Admin   bupivacaine (MARCAINE) 0.5 % 10 mL, triamcinolone acetonide (KENALOG-40) 40 mg injection   Subcutaneous Once Carolan Clines, MD       bupivacaine (MARCAINE) 0.5 % 15 mL, phenazopyridine (PYRIDIUM) 400 mg bladder mixture   Bladder Instillation Once Carolan Clines, MD         Objective:  Objective  Physical Exam Constitutional:      General: She is not in acute distress.    Appearance: Normal appearance. She is not ill-appearing or toxic-appearing.  HENT:     Head: Normocephalic and atraumatic.     Right Ear: Tympanic membrane, ear canal and external ear normal.     Left Ear: Tympanic membrane, ear canal and external ear normal.     Nose: No congestion or rhinorrhea.  Eyes:     Extraocular Movements: Extraocular movements intact.     Pupils: Pupils are equal, round, and reactive to light.  Cardiovascular:     Rate and Rhythm: Normal rate and regular rhythm.     Pulses: Normal pulses.     Heart sounds: Normal heart sounds. No murmur heard. Pulmonary:     Effort: Pulmonary effort is normal. No respiratory distress.     Breath sounds: Normal breath sounds. No wheezing, rhonchi or rales.  Abdominal:     General: Bowel sounds are normal.     Palpations: Abdomen is soft. There is no mass.     Tenderness: no abdominal tenderness There is no guarding.     Hernia: No hernia is present.  Musculoskeletal:        General: Normal range of motion.     Cervical back: Normal range of motion and neck supple.  Skin:    General: Skin is warm and dry.  Neurological:     Mental Status: She is  alert and oriented to person, place, and time.   Psychiatric:        Behavior: Behavior normal.   BP 125/68 (BP Location: Left Arm, Cuff Size: Normal)   Pulse 80   Temp 98.1 F (36.7 C) (Oral)   Resp 12   Ht 5\' 3"  (1.6 m)   Wt 137 lb 9.6 oz (62.4 kg)   SpO2 100%   BMI 24.37 kg/m  Wt Readings from Last 3 Encounters:  06/07/21 137 lb 9.6 oz (62.4 kg)  05/31/21 136 lb 9.6 oz (62 kg)  05/17/21 137 lb (62.1 kg)     Lab Results  Component Value Date   WBC 11.8 (H) 06/07/2021   HGB 12.1 06/07/2021   HCT 37.8 06/07/2021   PLT 319.0 06/07/2021   GLUCOSE 91 06/03/2021   CHOL 132 03/08/2021   TRIG 112.0 03/08/2021   HDL 46.50 03/08/2021   LDLDIRECT 117.3 04/08/2012   LDLCALC 63 03/08/2021   ALT 10 06/03/2021   AST 12 06/03/2021   NA 142 06/03/2021   K 4.8 06/03/2021   CL 107 06/03/2021   CREATININE 1.26 (H) 06/03/2021   BUN 18 06/03/2021   CO2 28 06/03/2021   TSH 3.67 03/08/2021   INR 1.05 08/03/2010   HGBA1C 5.8 03/08/2021   MICROALBUR 0.2 07/10/2013    DG Chest 2 View  Result Date: 05/13/2021 CLINICAL DATA:  75 year old female with history of cough for the past several weeks. Former smoker. EXAM: CHEST - 2 VIEW COMPARISON:  Chest x-ray 06/10/2020. FINDINGS: Lung volumes are normal. No consolidative airspace disease. No pleural effusions. No pneumothorax. No pulmonary nodule or mass noted. Pulmonary vasculature and the cardiomediastinal silhouette are within normal limits. IMPRESSION: No radiographic evidence of acute cardiopulmonary disease. Electronically Signed   By: Vinnie Langton M.D.   On: 05/13/2021 09:30     Assessment & Plan:  Plan    Meds ordered this encounter  Medications   sucralfate (CARAFATE) 1 GM/10ML suspension    Sig: Take 10 mLs (1 g total) by mouth 4 (four) times daily -  with meals and at bedtime.    Dispense:  420 mL    Refill:  1   sulfamethoxazole-trimethoprim (BACTRIM DS) 800-160 MG tablet    Sig: Take 1 tablet by mouth 2 (two) times daily.    Dispense:  20 tablet    Refill:  0    methylPREDNISolone (MEDROL) 4 MG tablet    Sig: 5 tabs po daily x 3 days then 4 tabs po daily x 3 days then 2 tabs po daily x 3 days then 1 tab po daily x 3 days then 1/2 po daily x 3 days and stop    Dispense:  38 tablet    Refill:  0    Problem List Items Addressed This Visit     GERD (gastroesophageal reflux disease) (Chronic)    Avoid offending foods, start probiotics. Do not eat large meals in late evening and consider raising head of bed. Sucralfate, pepcid consult with GI       Relevant Medications   sucralfate (CARAFATE) 1 GM/10ML suspension   Other Relevant Orders   Ambulatory referral to Gastroenterology   Essential hypertension    Well controlled, no changes to meds. Encouraged heart healthy diet such as the DASH diet and exercise as tolerated.        Allergic rhinitis    Zyrtec bid       Chronic cough    multifactorial, started on Medrol and  Bactrim to see if improvement occurs. She has been feeling ill, fatigued, myalgias, congested       Hyperglycemia    hgba1c acceptable, minimize simple carbs. Increase exercise as tolerated. Continue current meds       High serum vitamin D    monitor       Other Visit Diagnoses     Rectal bleeding    -  Primary   Relevant Orders   Ambulatory referral to Gastroenterology   CBC with Differential/Platelet   Change in bowel habits       Relevant Orders   Ambulatory referral to Gastroenterology   Leukocytosis, unspecified type       Relevant Orders   CBC w/Diff (Completed)   CBC with Differential/Platelet       Follow-up: Return lab appt 6/27.   I,David Hanna,acting as a scribe for [name], MD.,have documented all relevant documentation on the behalf of [name], MD,as directed by  [name], MD while in the presence of [name], MD.  I, Mosie Lukes, MD personally performed the services described in this documentation. All medical record entries made by the scribe were at my direction and  in my presence. I have reviewed the chart and agree that the record reflects my personal performance and is accurate and complete

## 2021-06-07 NOTE — Patient Instructions (Signed)
Cough, Adult °Coughing is a reflex that clears your throat and your airways (respiratory system). Coughing helps to heal and protect your lungs. It is normal to cough occasionally, but a cough that happens with other symptoms or lasts a long time may be a sign of a condition that needs treatment. An acute cough may only last 2-3 weeks, while a chronic cough may last 8 or more weeks. °Coughing is commonly caused by: °Infection of the respiratory systemby viruses or bacteria. °Breathing in substances that irritate your lungs. °Allergies. °Asthma. °Mucus that runs down the back of your throat (postnasal drip). °Smoking. °Acid backing up from the stomach into the esophagus (gastroesophageal reflux). °Certain medicines. °Chronic lung problems. °Other medical conditions such as heart failure or a blood clot in the lung (pulmonary embolism). °Follow these instructions at home: °Medicines °Take over-the-counter and prescription medicines only as told by your health care provider. °Talk with your health care provider before you take a cough suppressant medicine. °Lifestyle ° °Avoid cigarette smoke. Do not use any products that contain nicotine or tobacco, such as cigarettes, e-cigarettes, and chewing tobacco. If you need help quitting, ask your health care provider. °Drink enough fluid to keep your urine pale yellow. °Avoid caffeine. °Do not drink alcohol if your health care provider tells you not to drink. °General instructions ° °Pay close attention to changes in your cough. Tell your health care provider about them. °Always cover your mouth when you cough. °Avoid things that make you cough, such as perfume, candles, cleaning products, or campfire or tobacco smoke. °If the air is dry, use a cool mist vaporizer or humidifier in your bedroom or your home to help loosen secretions. °If your cough is worse at night, try to sleep in a semi-upright position. °Rest as needed. °Keep all follow-up visits as told by your health care  provider. This is important. °Contact a health care provider if you: °Have new symptoms. °Cough up pus. °Have a cough that does not get better after 2-3 weeks or gets worse. °Cannot control your cough with cough suppressant medicines and you are losing sleep. °Have pain that gets worse or pain that is not helped with medicine. °Have a fever. °Have unexplained weight loss. °Have night sweats. °Get help right away if: °You cough up blood. °You have difficulty breathing. °Your heartbeat is very fast. °These symptoms may represent a serious problem that is an emergency. Do not wait to see if the symptoms will go away. Get medical help right away. Call your local emergency services (911 in the [place] Do not drive yourself to the hospital. °Summary °Coughing is a reflex that clears your throat and your airways. It is normal to cough occasionally, but a cough that happens with other symptoms or lasts a long time may be a sign of a condition that needs treatment. °Take over-the-counter and prescription medicines only as told by your health care provider. °Always cover your mouth when you cough. °Contact a health care provider if you have new symptoms or a cough that does not get better after 2-3 weeks or gets worse. °This information is not intended to replace advice given to you by your health care provider. Make sure you discuss any questions you have with your health care provider. °Document Revised: 12/30/2018 Document Reviewed: 12/30/2018 °Elsevier Patient Education © 2022 [employer] ° °

## 2021-06-08 NOTE — Assessment & Plan Note (Signed)
Zyrtec bid

## 2021-06-08 NOTE — Assessment & Plan Note (Signed)
multifactorial, started on Medrol and Bactrim to see if improvement occurs. She has been feeling ill, fatigued, myalgias, congested

## 2021-06-08 NOTE — Assessment & Plan Note (Signed)
monitor

## 2021-06-08 NOTE — Assessment & Plan Note (Addendum)
Avoid offending foods, start probiotics. Do not eat large meals in late evening and consider raising head of bed. Sucralfate, pepcid consult with GI

## 2021-06-08 NOTE — Assessment & Plan Note (Signed)
Well controlled, no changes to meds. Encouraged heart healthy diet such as the DASH diet and exercise as tolerated.  °

## 2021-06-08 NOTE — Assessment & Plan Note (Signed)
hgba1c acceptable, minimize simple carbs. Increase exercise as tolerated. Continue current meds 

## 2021-06-24 ENCOUNTER — Other Ambulatory Visit: Payer: Self-pay | Admitting: Family Medicine

## 2021-07-05 DIAGNOSIS — H04123 Dry eye syndrome of bilateral lacrimal glands: Secondary | ICD-10-CM | POA: Diagnosis not present

## 2021-07-05 DIAGNOSIS — H43813 Vitreous degeneration, bilateral: Secondary | ICD-10-CM | POA: Diagnosis not present

## 2021-07-05 DIAGNOSIS — Z961 Presence of intraocular lens: Secondary | ICD-10-CM | POA: Diagnosis not present

## 2021-07-06 ENCOUNTER — Ambulatory Visit: Payer: Medicare Other | Admitting: Cardiology

## 2021-07-13 DIAGNOSIS — H903 Sensorineural hearing loss, bilateral: Secondary | ICD-10-CM | POA: Diagnosis not present

## 2021-07-13 DIAGNOSIS — J310 Chronic rhinitis: Secondary | ICD-10-CM | POA: Diagnosis not present

## 2021-07-13 DIAGNOSIS — H6983 Other specified disorders of Eustachian tube, bilateral: Secondary | ICD-10-CM | POA: Diagnosis not present

## 2021-07-13 DIAGNOSIS — J343 Hypertrophy of nasal turbinates: Secondary | ICD-10-CM | POA: Diagnosis not present

## 2021-07-14 ENCOUNTER — Ambulatory Visit: Payer: Medicare Other | Admitting: Emergency Medicine

## 2021-07-14 ENCOUNTER — Other Ambulatory Visit: Payer: Self-pay | Admitting: Otolaryngology

## 2021-07-14 ENCOUNTER — Encounter: Payer: Self-pay | Admitting: Emergency Medicine

## 2021-07-14 ENCOUNTER — Other Ambulatory Visit: Payer: Self-pay

## 2021-07-14 DIAGNOSIS — R053 Chronic cough: Secondary | ICD-10-CM | POA: Diagnosis not present

## 2021-07-14 DIAGNOSIS — J45909 Unspecified asthma, uncomplicated: Secondary | ICD-10-CM

## 2021-07-14 DIAGNOSIS — J309 Allergic rhinitis, unspecified: Secondary | ICD-10-CM | POA: Diagnosis not present

## 2021-07-14 DIAGNOSIS — K219 Gastro-esophageal reflux disease without esophagitis: Secondary | ICD-10-CM

## 2021-07-14 DIAGNOSIS — J329 Chronic sinusitis, unspecified: Secondary | ICD-10-CM

## 2021-07-14 NOTE — Patient Instructions (Signed)
We will perform pulmonary function testing in next office visit. Okay to keep albuterol available to use 2 puffs if needed for shortness of breath, chest tightness, wheezing, spells of coughing. Agree with getting your CT scan of the sinuses as planned by Dr Benjamine Mola. We will try to get copies of your ENT notes to review next time Stop loratadine (Claritin) and start cetirizine (generic Zyrtec) 10 mg once daily. Continue fluticasone nasal spray.  Use 2 sprays each nostril once daily.  You can increase this to twice a day if you continue to have drainage.  Try to avoid taking it right before bedtime because we do not want it to drain to your throat. Temporarily increase your pantoprazole (Protonix) to 40 mg twice a day.  Take this medication 1 hour around food. Continue your Carafate as you have been taking it. Follow with Dr. Lamonte Sakai next available with full pulmonary function testing on the same day.

## 2021-07-14 NOTE — Assessment & Plan Note (Signed)
Try changing the loratadine to cetirizine.  Try increasing the fluticasone nasal spray to twice a day.  We will avoid taking this right before bedtime to prevent drainage of the medication to her throat

## 2021-07-14 NOTE — Assessment & Plan Note (Signed)
Fairly severe breakthrough GERD even on the Protonix and Carafate that were recently added.  I will double her Protonix temporarily and follow for any interval improvement.

## 2021-07-14 NOTE — Progress Notes (Signed)
Subjective:    Patient ID: [name], female    DOB: 01-11-1946, 75 y.o.   MRN: [id number]  HPI 75 year old woman with former tobacco use (10-15 pack years), hypertension, CAD, allergic rhinitis, GERD, breast cancer.  Have seen her in the distant past for chronic cough and asthma.  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  We had also been remotely following a stable 5 mm right upper lobe pulmonary nodule.  She is here to reestablish care, reports today that she has been dealing with chronic cough since, flaring symptoms since she had COVID in 02/2021. Her nasosinus pressure, drainage, cough all increased significantly. Severe cough at the time, improving some but not back to her usual baseline - coughing more and through the day, has mid anterior chest discomfort with the cough, sounds musculoskeletal.  Cough can happen at any time, can wake her at night. She has a coarse voice, some hoarseness.  She saw Dr Benjamine Mola >> No real comment about seeing a cord abnormality. A Ct sinuses is pending. She has albuterol available that she can use if needed - can help with some exposure related SOB. May also help her cough.   She was treated empirically with antibiotics for possible sinusitis as a contributor, also recent steroid taper in June. Added protonix qd, carafate. CXR 05/11/21 was clear. Still having a lot of GERD. She is on flonase and loratadine.    Review of Systems As per HPI  Past Medical History:  Diagnosis Date   Allergy    Anemia    Anxiety    past hx    Arthritis    Asthma    Atrial flutter (HCC)    past history- not current   Breast cancer ([place])    CAD (coronary artery disease) CARDIOLOGIST--  DR Angelena Form   mild non-obstructive cad   Cancer ([place])    right   Cataract    bilaterally removed    Chronic constipation    Chronic kidney disease    interstitial cystitis   Concussion    x 3   COPD (chronic obstructive pulmonary disease) (HCC)    Depression     past hx    Family history of malignant hyperthermia    father had this   Fibromyalgia    Frequency of urination    GERD (gastroesophageal reflux disease)    H/O hiatal hernia    History of basal cell carcinoma excision    X2   History of breast cancer ONCOLOGIST-- DR Jana Hakim---  NO RECURRANCE   DX 07/2012;  LOW GRADE DCIS  ER+PR+  ----  S/P RIGHT LUMPECTOMY WITH NEGATIVE MARGINS/   RADIATION ENDED 11/2012   History of chronic bronchitis    History of colonic polyps    History of tachycardia    CONTROLLED  WITH ATENOLOL   Hyperlipidemia    Hypertension    Neuromuscular disorder (HCC)    fibromyalgia   Neuropathy    Osteoporosis 01/2019   T score -2.2 stable/improved from prior study   Pelvic pain    Personal history of radiation therapy    S/P radiation therapy 11/12/12 - 12/05/12   right Breast   Sepsis ([place]) 2014   from UTI    Sinus headache    Urgency of urination      Family History  Problem Relation Age of Onset   Rectal cancer Mother    Colon cancer Mother    Pancreatic cancer Mother  Diabetes Mother    Breast cancer Mother 7   Breast cancer Maternal Aunt        breast   Birth defects Maternal Aunt    Irritable bowel syndrome Son    Heart disease Son        CAD, MV replacement   Allergic Disorder Daughter    Diabetes Daughter    Colon cancer Father    Colon polyps Father    Diabetes Father    Stroke Father    Heart disease Father        CHF   Hyperlipidemia Father    Hypertension Father    Arthritis Father    Breast cancer Paternal Aunt    Breast cancer Paternal Aunt    Arthritis Paternal Uncle    Allergic Disorder Daughter    Heart disease Cousin        CAD, had a blood clot after stenting   Esophageal cancer Neg Hx    Stomach cancer Neg Hx      Social History   Socioeconomic History   Marital status: Married    Spouse name: Jori Moll    Number of children: 3   Years of education: BA   Highest education level: Not on file   Occupational History   Occupation: Retired Programmer, multimedia: RETIRED  Tobacco Use   Smoking status: Former    Packs/day: 1.00    Years: 15.00    Pack years: 15.00    Types: Cigarettes    Quit date: 05/27/2005    Years since quitting: 16.1   Smokeless tobacco: Never  Vaping Use   Vaping Use: Never used  Substance and Sexual Activity   Alcohol use: No    Alcohol/week: 0.0 standard drinks   Drug use: No   Sexual activity: Yes    Partners: Male    Comment: 1st intercourse 75 yo-Fewer than 5 partners  Other Topics Concern   Not on file  Social History Narrative   Lives with husband.   Caffeine use: 1/2 cup per day   Exercise-- 2days a week [employer],  Water aerobics, walking       Retired Marine scientist   No dietary restrictions, tries to maintain a heart healthy diet   Social Determinants of Radio broadcast assistant Strain: Not on file  Food Insecurity: Not on file  Transportation Needs: Not on file  Physical Activity: Not on file  Stress: Not on file  Social Connections: Not on file  Intimate Partner Violence: Not on file     Allergies  Allergen Reactions   Clindamycin Rash and Shortness Of Breath   Clindamycin Hcl Shortness Of Breath and Rash   Penicillins Anaphylaxis   Rosuvastatin Anaphylaxis   Baclofen Other (See Comments) and Rash   Lincomycin Other (See Comments)   Clindamycin Hcl     Other reaction(s): Shortness of Breath, Rash   Codeine Hives and Other (See Comments)    headache Other reaction(s): Headache, Nausea, headache   Erythromycin Hives   Erythromycin Base Other (See Comments)    other   Fluzone [Influenza Virus Vaccine] Other (See Comments)    Local reaction at the site   Haemophilus Influenzae Other (See Comments)    Local reaction at the site Local reaction at the site   Haemophilus Influenzae Vaccines Other (See Comments)    Local reaction at site   Latex Hives   Other Other (See Comments)    Local reaction at site   Pentazocine Other (  See  Comments)   Pentazocine Lactate Other (See Comments)    HALLUCINATION   Pneumococcal Vaccine Hives and Swelling    Other reaction(s): Hives, Shortness of Breath   Pneumococcal Vaccine Polyvalent Hives, Swelling and Other (See Comments)    REACTION: redness, swelling, and hives at injection site   Tamoxifen Nausea And Vomiting and Other (See Comments)    HEADACHE Other reaction(s): Headache, Nausea     Outpatient Medications Prior to Visit  Medication Sig Dispense Refill   acetaminophen (TYLENOL) 500 MG tablet Take 1,000 mg by mouth every 6 (six) hours as needed for mild pain or headache.      albuterol (VENTOLIN HFA) 108 (90 Base) MCG/ACT inhaler Inhale 2 puffs into the lungs every 4 (four) hours as needed. 1 each 0   amLODipine (NORVASC) 10 MG tablet Take 1 tablet (10 mg total) by mouth daily. 90 tablet 3   aspirin EC 81 MG tablet Take 1 tablet (81 mg total) by mouth daily. 90 tablet 3   atenolol (TENORMIN) 50 MG tablet Take 1 tablet (50 mg total) by mouth 2 (two) times daily. 180 tablet 1   benzonatate (TESSALON PERLES) 100 MG capsule Take 1 capsule (100 mg total) by mouth 3 (three) times daily as needed. 20 capsule 0   Cyanocobalamin (VITAMIN B-12) 5000 MCG TBDP Take 1 tablet by mouth daily with breakfast.     denosumab (PROLIA) 60 MG/ML SOSY injection Inject 60 mg into the skin every 6 (six) months.     DULoxetine (CYMBALTA) 60 MG capsule Take 2 capsules (120 mg total) by mouth daily. 180 capsule 1   Estradiol 10 MCG TABS vaginal tablet Place 1 tablet (10 mcg total) vaginally 2 (two) times a week. 24 tablet 4   famotidine (PEPCID) 40 MG tablet Take 1 tablet (40 mg total) by mouth at bedtime. 90 tablet 1   fluticasone (FLONASE) 50 MCG/ACT nasal spray Use 2 spray(s) in each nostril once daily 16 g 5   furosemide (LASIX) 20 MG tablet Take 1 tablet (20 mg total) by mouth as needed for edema. 90 tablet 3   gabapentin (NEURONTIN) 300 MG capsule Take 300 mg by mouth. 2cap in the morning, 2cap  at noon, and 3caps at bedtime     hyoscyamine (LEVSIN SL) 0.125 MG SL tablet Place 1 tablet (0.125 mg total) under the tongue every 4 (four) hours as needed. (Patient not taking: Reported on 07/14/2021) 30 tablet 0   loratadine (CLARITIN) 10 MG tablet Take 1 tablet (10 mg total) by mouth daily. 30 tablet 11   losartan (COZAAR) 100 MG tablet Take 1 tablet by mouth once daily 90 tablet 3   methylPREDNISolone (MEDROL) 4 MG tablet 5 tabs po daily x 3 days then 4 tabs po daily x 3 days then 2 tabs po daily x 3 days then 1 tab po daily x 3 days then 1/2 po daily x 3 days and stop 38 tablet 0   mupirocin ointment (BACTROBAN) 2 % Apply thin film twice daily. 22 g 0   nitroGLYCERIN (NITROSTAT) 0.4 MG SL tablet Place 1 tablet (0.4 mg total) under the tongue every 5 (five) minutes as needed for chest pain. 25 tablet 1   nystatin cream (MYCOSTATIN) Apply 1 application topically 2 (two) times daily. 30 g 2   pantoprazole (PROTONIX) 40 MG tablet Take 1 tablet (40 mg total) by mouth daily. 90 tablet 1   pentosan polysulfate (ELMIRON) 100 MG capsule Take 1 capsule (100 mg total) by  mouth 3 (three) times daily. Reported on 01/05/2016 (Patient taking differently: Take 100 mg by mouth 3 (three) times daily with meals as needed. Reported on 01/05/2016) 90 capsule 11   polyethylene glycol (MIRALAX) 17 g packet Take 17 g by mouth daily. 14 each 0   pramipexole (MIRAPEX) 0.125 MG tablet Take 1-2 tablets (0.125-0.25 mg total) by mouth in the morning and at bedtime. (Patient not taking: Reported on 07/14/2021) 60 tablet 5   predniSONE (DELTASONE) 10 MG tablet 4 tabs by mouth once daily for 2 days, then 3 tabs daily x 2 days, then 2 tabs daily x 2 days, then 1 tab daily x 2 days 20 tablet 0   promethazine-dextromethorphan (PROMETHAZINE-DM) 6.25-15 MG/5ML syrup Take 5 mLs by mouth 3 (three) times daily as needed for cough. (Patient not taking: Reported on 06/07/2021) 240 mL 0   REPATHA SURECLICK 902 MG/ML SOAJ INJECT 140 MG  SUBCUTANEOUSLY EVERY 14 DAYS 2 mL 2   sucralfate (CARAFATE) 1 GM/10ML suspension Take 10 mLs (1 g total) by mouth 4 (four) times daily -  with meals and at bedtime. (Patient not taking: Reported on 07/14/2021) 420 mL 1   sulfamethoxazole-trimethoprim (BACTRIM DS) 800-160 MG tablet Take 1 tablet by mouth 2 (two) times daily. 20 tablet 0   traZODone (DESYREL) 100 MG tablet TAKE 1 TABLET BY MOUTH AT BEDTIME 90 tablet 0   trimethoprim-polymyxin b (POLYTRIM) ophthalmic solution Place 1 drop into both eyes every 6 (six) hours. (Patient not taking: Reported on 07/14/2021) 10 mL 0   Facility-Administered Medications Prior to Visit  Medication Dose Route Frequency Provider Last Rate Last Admin   bupivacaine (MARCAINE) 0.5 % 10 mL, triamcinolone acetonide (KENALOG-40) 40 mg injection   Subcutaneous Once Carolan Clines, MD       bupivacaine (MARCAINE) 0.5 % 15 mL, phenazopyridine (PYRIDIUM) 400 mg bladder mixture   Bladder Instillation Once Carolan Clines, MD              Objective:   Physical Exam  Vitals:   07/14/21 1138  BP: 114/68  Pulse: 69  Temp: 98.1 F (36.7 C)  TempSrc: Oral  SpO2: 98%  Weight: 139 lb 12.8 oz (63.4 kg)  Height: 5' 4.5" (1.638 m)   Gen: Pleasant, well-nourished, in no distress,  normal affect  ENT: No lesions,  mouth clear,  oropharynx clear, no postnasal drip, somewhat coarse voice  Neck: No JVD, no stridor  Lungs: No use of accessory muscles, no crackles or wheezing on normal respiration, no wheeze on forced expiration  Cardiovascular: RRR, late systolic murmur with intact S2  Musculoskeletal: No deformities, no cyanosis or clubbing.  Tender to palpation lower extremities  Neuro: alert, awake, non focal  Skin: Warm, no lesions or rashes     Assessment & Plan:  Chronic cough She has kept some baseline cough for years, but clearly has been accentuated and has been worse since she had COVID-19 earlier this year.  Some slow improvement but not back  to her usual baseline.  Suspect that this is driven mainly by her upper airway irritation but note that she does also have asthma which could be a contributor.  She does state that albuterol is sometimes helpful.  Sustaining factors both for upper airway irritation syndrome and asthma include GERD and chronic rhinitis.  We will try to treat both more aggressively.  I will obtain PFT to quantify her degree of obstruction.  She may benefit from ICS/LABA, although starting this may be complicated because she  has history of medication intolerances.  Consider also any impact of chronic sinusitis or her vocal cord hemangioma.  She was recently evaluated by Dr. Benjamine Mola and I will try to get copies of his notes.  Asthma, allergic Not currently on scheduled bronchodilator regimen.  She has albuterol that she uses as needed.  As above may benefit from ICS/LABA depending on PFT results, depending on how well cough improves with treatment of her contributing factors GERD and allergic rhinitis.  Allergic rhinitis Try changing the loratadine to cetirizine.  Try increasing the fluticasone nasal spray to twice a day.  We will avoid taking this right before bedtime to prevent drainage of the medication to her throat  GERD (gastroesophageal reflux disease) Fairly severe breakthrough GERD even on the Protonix and Carafate that were recently added.  I will double her Protonix temporarily and follow for any interval improvement.  Baltazar Apo, MD, PhD 07/14/2021, 12:23 PM [facility] [phone] or if no answer before 7:00PM call [phone] For any issues after 7:00PM please call eLink [phone]

## 2021-07-14 NOTE — Assessment & Plan Note (Signed)
She has kept some baseline cough for years, but clearly has been accentuated and has been worse since she had COVID-19 earlier this year.  Some slow improvement but not back to her usual baseline.  Suspect that this is driven mainly by her upper airway irritation but note that she does also have asthma which could be a contributor.  She does state that albuterol is sometimes helpful.  Sustaining factors both for upper airway irritation syndrome and asthma include GERD and chronic rhinitis.  We will try to treat both more aggressively.  I will obtain PFT to quantify her degree of obstruction.  She may benefit from ICS/LABA, although starting this may be complicated because she has history of medication intolerances.  Consider also any impact of chronic sinusitis or her vocal cord hemangioma.  She was recently evaluated by Dr. Benjamine Mola and I will try to get copies of his notes.

## 2021-07-14 NOTE — Assessment & Plan Note (Signed)
Not currently on scheduled bronchodilator regimen.  She has albuterol that she uses as needed.  As above may benefit from ICS/LABA depending on PFT results, depending on how well cough improves with treatment of her contributing factors GERD and allergic rhinitis.

## 2021-07-21 ENCOUNTER — Ambulatory Visit (INDEPENDENT_AMBULATORY_CARE_PROVIDER_SITE_OTHER): Payer: Medicare Other | Admitting: Family Medicine

## 2021-07-21 ENCOUNTER — Other Ambulatory Visit: Payer: Self-pay

## 2021-07-21 ENCOUNTER — Encounter: Payer: Self-pay | Admitting: Family Medicine

## 2021-07-21 ENCOUNTER — Other Ambulatory Visit: Payer: Self-pay | Admitting: Cardiology

## 2021-07-21 VITALS — BP 114/68 | HR 51 | Temp 98.8°F | Resp 16 | Wt 140.2 lb

## 2021-07-21 DIAGNOSIS — R739 Hyperglycemia, unspecified: Secondary | ICD-10-CM

## 2021-07-21 DIAGNOSIS — M2550 Pain in unspecified joint: Secondary | ICD-10-CM

## 2021-07-21 DIAGNOSIS — K219 Gastro-esophageal reflux disease without esophagitis: Secondary | ICD-10-CM

## 2021-07-21 DIAGNOSIS — Z789 Other specified health status: Secondary | ICD-10-CM

## 2021-07-21 DIAGNOSIS — N289 Disorder of kidney and ureter, unspecified: Secondary | ICD-10-CM

## 2021-07-21 DIAGNOSIS — E785 Hyperlipidemia, unspecified: Secondary | ICD-10-CM

## 2021-07-21 DIAGNOSIS — E538 Deficiency of other specified B group vitamins: Secondary | ICD-10-CM

## 2021-07-21 DIAGNOSIS — I10 Essential (primary) hypertension: Secondary | ICD-10-CM

## 2021-07-21 DIAGNOSIS — J45909 Unspecified asthma, uncomplicated: Secondary | ICD-10-CM

## 2021-07-21 DIAGNOSIS — D72829 Elevated white blood cell count, unspecified: Secondary | ICD-10-CM

## 2021-07-21 DIAGNOSIS — J449 Chronic obstructive pulmonary disease, unspecified: Secondary | ICD-10-CM

## 2021-07-21 DIAGNOSIS — G4733 Obstructive sleep apnea (adult) (pediatric): Secondary | ICD-10-CM

## 2021-07-21 DIAGNOSIS — J329 Chronic sinusitis, unspecified: Secondary | ICD-10-CM

## 2021-07-21 LAB — CBC WITH DIFFERENTIAL/PLATELET
Basophils Absolute: 0.1 10*3/uL (ref 0.0–0.1)
Basophils Relative: 0.9 % (ref 0.0–3.0)
Eosinophils Absolute: 0.3 10*3/uL (ref 0.0–0.7)
Eosinophils Relative: 4.3 % (ref 0.0–5.0)
HCT: 37.2 % (ref 36.0–46.0)
Hemoglobin: 12.1 g/dL (ref 12.0–15.0)
Lymphocytes Relative: 15.5 % (ref 12.0–46.0)
Lymphs Abs: 1.2 10*3/uL (ref 0.7–4.0)
MCHC: 32.4 g/dL (ref 30.0–36.0)
MCV: 93.3 fl (ref 78.0–100.0)
Monocytes Absolute: 0.6 10*3/uL (ref 0.1–1.0)
Monocytes Relative: 7.4 % (ref 3.0–12.0)
Neutro Abs: 5.6 10*3/uL (ref 1.4–7.7)
Neutrophils Relative %: 71.9 % (ref 43.0–77.0)
Platelets: 294 10*3/uL (ref 150.0–400.0)
RBC: 3.98 Mil/uL (ref 3.87–5.11)
RDW: 16.2 % — ABNORMAL HIGH (ref 11.5–15.5)
WBC: 7.8 10*3/uL (ref 4.0–10.5)

## 2021-07-21 LAB — COMPREHENSIVE METABOLIC PANEL
ALT: 10 U/L (ref 0–35)
AST: 16 U/L (ref 0–37)
Albumin: 3.8 g/dL (ref 3.5–5.2)
Alkaline Phosphatase: 103 U/L (ref 39–117)
BUN: 12 mg/dL (ref 6–23)
CO2: 31 mEq/L (ref 19–32)
Calcium: 8.9 mg/dL (ref 8.4–10.5)
Chloride: 103 mEq/L (ref 96–112)
Creatinine, Ser: 0.99 mg/dL (ref 0.40–1.20)
GFR: 55.56 mL/min — ABNORMAL LOW (ref >60.00)
Glucose, Bld: 98 mg/dL (ref 70–99)
Potassium: 3.9 mEq/L (ref 3.5–5.1)
Sodium: 140 mEq/L (ref 135–145)
Total Bilirubin: 0.4 mg/dL (ref 0.2–1.2)
Total Protein: 6.2 g/dL (ref 6.0–8.3)

## 2021-07-21 LAB — TSH: TSH: 3.77 u[IU]/mL (ref 0.35–5.50)

## 2021-07-21 LAB — SEDIMENTATION RATE: Sed Rate: 39 mm/hr — ABNORMAL HIGH (ref 0–30)

## 2021-07-21 LAB — HEMOGLOBIN A1C: Hgb A1c MFr Bld: 5.9 % (ref 4.6–6.5)

## 2021-07-21 MED ORDER — PROMETHAZINE-DM 6.25-15 MG/5ML PO SYRP: 5.0000 mL | ORAL_SOLUTION | Freq: Three times a day (TID) | ORAL | 0 refills | Status: DC | PRN

## 2021-07-21 NOTE — Progress Notes (Signed)
Subjective:    Patient ID: [name], female    DOB: [birth year], 75 y.o.   MRN: [id number]  Chief Complaint  Patient presents with   Follow-up    HPI Patient is in today for follow up on chronic medical concerns. She is still struggling with cough, congestion, malaise. She has now been seen by ENT and pulmonary and is due to get a CT of the sinuses and CT of the chest to work up her persistent symptoms. She continues to note fatigue as her worst symptom. She continues to follow with urology as well. No recent febrile illness or hospitalizations. Denies CP/palp/fevers/GI or GU c/o. Taking meds as prescribed   Past Medical History:  Diagnosis Date   Allergy    Anemia    Anxiety    past hx    Arthritis    Asthma    Atrial flutter ([place])    past history- not current   Breast cancer ([place])    CAD (coronary artery disease) CARDIOLOGIST--  DR Angelena Form   mild non-obstructive cad   Cancer ([place])    right   Cataract    bilaterally removed    Chronic constipation    Chronic kidney disease    interstitial cystitis   Concussion    x 3   COPD (chronic obstructive pulmonary disease) (HCC)    Depression    past hx    Family history of malignant hyperthermia    father had this   Fibromyalgia    Frequency of urination    GERD (gastroesophageal reflux disease)    H/O hiatal hernia    History of basal cell carcinoma excision    X2   History of breast cancer ONCOLOGIST-- DR Jana Hakim---  NO RECURRANCE   DX 07/2012;  LOW GRADE DCIS  ER+PR+  ----  S/P RIGHT LUMPECTOMY WITH NEGATIVE MARGINS/   RADIATION ENDED 11/2012   History of chronic bronchitis    History of colonic polyps    History of tachycardia    CONTROLLED  WITH ATENOLOL   Hyperlipidemia    Hypertension    Neuromuscular disorder (HCC)    fibromyalgia   Neuropathy    Osteoporosis 01/2019   T score -2.2 stable/improved from prior study   Pelvic pain    Personal history of radiation therapy    S/P radiation therapy  11/12/12 - 12/05/12   right Breast   Sepsis ([place]) 2014   from UTI    Sinus headache    Urgency of urination     Past Surgical History:  Procedure Laterality Date   80 HOUR [place] STUDY N/A 04/03/2016   Procedure: 24 HOUR [place] STUDY;  Surgeon: Manus Gunning, MD;  Location: [facility];  Service: Gastroenterology;  Laterality: N/A;   BREAST BIOPSY Right 08/23/2012   ADH   BREAST BIOPSY Right 10/11/2012   Ductal Carcinoma   BREAST EXCISIONAL BIOPSY Right 09/04/2013   benign   BREAST LUMPECTOMY Right 10-11-2012   W/ SLN BX   CARDIAC CATHETERIZATION  09-13-2007  DR Lia Foyer   WELL-PRESERVED LVF/  DIFFUSE SCATTERED CORONARY CALCIFACATION AND ATHEROSCLEROSIS WITHOUT OBSTRUCTION   CARDIAC CATHETERIZATION  08-04-2010  DR MCALHANY   NON-OBSTRUCTIVE CAD/  pLAD 40%/  oLAD 30%/  mLAD 30%/  pRCA 30%/  EF 60%   CARDIOVASCULAR STRESS TEST  06-18-2012  DR McALHANY   LOW RISK NUCLEAR STUDY/  SMALL FIXED AREA OF MODERATELY DECREASED UPTAKE IN ANTEROSEPTAL WALL WHICH MAY BE ARTIFACTUAL/  NO ISCHEMIA/  EF 68%  COLONOSCOPY  09-29-2010   CYSTOSCOPY     CYSTOSCOPY WITH HYDRODISTENSION AND BIOPSY N/A 03/06/2014   Procedure: CYSTOSCOPY/HYDRODISTENSION/ INSTILATION OF MARCAINE AND PYRIDIUM;  Surgeon: Ailene Rud, MD;  Location: [facility];  Service: Urology;  Laterality: N/A;   ESOPHAGEAL MANOMETRY N/A 04/03/2016   Procedure: ESOPHAGEAL MANOMETRY (EM);  Surgeon: Manus Gunning, MD;  Location: [facility];  Service: Gastroenterology;  Laterality: N/A;   EXTRACORPOREAL SHOCK WAVE LITHOTRIPSY Left 02/06/2019   Procedure: EXTRACORPOREAL SHOCK WAVE LITHOTRIPSY (ESWL);  Surgeon: Kathie Rhodes, MD;  Location: [facility];  Service: Urology;  Laterality: Left;   NASAL SINUS SURGERY  1985   ORIF RIGHT ANKLE  FX  2006   POLYPECTOMY     REMOVAL VOCAL CORD CYST  FEB 2014   RIGHT BREAST BX  08-23-2012   RIGHT HAND SURGERY  X3  LAST ONE 2009   INCLUDES  ORIF RIGHT 5TH FINGER AND REVISION  TWICE   SKIN CANCER EXCISION     TONSILLECTOMY AND ADENOIDECTOMY  AGE 62   TOTAL ABDOMINAL HYSTERECTOMY W/ BILATERAL SALPINGOOPHORECTOMY  1982   W/  APPENDECTOMY   TRANSTHORACIC ECHOCARDIOGRAM  06-24-2012   GRADE I DIASTOLIC DYSFUNCTION/  EF 55-60%/  MILD MR   UPPER GASTROINTESTINAL ENDOSCOPY      Family History  Problem Relation Age of Onset   Rectal cancer Mother    Colon cancer Mother    Pancreatic cancer Mother    Diabetes Mother    Breast cancer Mother 50   Breast cancer Maternal Aunt        breast   Birth defects Maternal Aunt    Irritable bowel syndrome Son    Heart disease Son        CAD, MV replacement   Allergic Disorder Daughter    Diabetes Daughter    Colon cancer Father    Colon polyps Father    Diabetes Father    Stroke Father    Heart disease Father        CHF   Hyperlipidemia Father    Hypertension Father    Arthritis Father    Breast cancer Paternal Aunt    Breast cancer Paternal Aunt    Arthritis Paternal Uncle    Allergic Disorder Daughter    Heart disease Cousin        CAD, had a blood clot after stenting   Esophageal cancer Neg Hx    Stomach cancer Neg Hx     Social History   Socioeconomic History   Marital status: Married    Spouse name: Jori Moll    Number of children: 3   Years of education: BA   Highest education level: Not on file  Occupational History   Occupation: Retired Programmer, multimedia: RETIRED  Tobacco Use   Smoking status: Former    Packs/day: 1.00    Years: 15.00    Pack years: 15.00    Types: Cigarettes    Quit date: 05/27/2005    Years since quitting: 16.1   Smokeless tobacco: Never  Vaping Use   Vaping Use: Never used  Substance and Sexual Activity   Alcohol use: No    Alcohol/week: 0.0 standard drinks   Drug use: No   Sexual activity: Yes    Partners: Male    Comment: 1st intercourse 75 yo-Fewer than 5 partners  Other Topics Concern   Not on file  Social History Narrative   Lives with husband.   Caffeine use:  1/2 cup per  day   Exercise-- 2days a week [employer],  [employer], walking       Retired Marine scientist   No dietary restrictions, tries to maintain a heart healthy diet   Social Determinants of Radio broadcast assistant Strain: Not on file  Food Insecurity: Not on file  Transportation Needs: Not on file  Physical Activity: Not on file  Stress: Not on file  Social Connections: Not on file  Intimate Partner Violence: Not on file    Outpatient Medications Prior to Visit  Medication Sig Dispense Refill   acetaminophen (TYLENOL) 500 MG tablet Take 1,000 mg by mouth every 6 (six) hours as needed for mild pain or headache.      albuterol (VENTOLIN HFA) 108 (90 Base) MCG/ACT inhaler Inhale 2 puffs into the lungs every 4 (four) hours as needed. 1 each 0   amLODipine (NORVASC) 10 MG tablet Take 1 tablet (10 mg total) by mouth daily. 90 tablet 3   aspirin EC 81 MG tablet Take 1 tablet (81 mg total) by mouth daily. 90 tablet 3   atenolol (TENORMIN) 50 MG tablet Take 1 tablet (50 mg total) by mouth 2 (two) times daily. 180 tablet 1   Cyanocobalamin (VITAMIN B-12) 5000 MCG TBDP Take 1 tablet by mouth daily with breakfast.     denosumab (PROLIA) 60 MG/ML SOSY injection Inject 60 mg into the skin every 6 (six) months.     DULoxetine (CYMBALTA) 60 MG capsule Take 2 capsules (120 mg total) by mouth daily. 180 capsule 1   Estradiol 10 MCG TABS vaginal tablet Place 1 tablet (10 mcg total) vaginally 2 (two) times a week. 24 tablet 4   fluticasone (FLONASE) 50 MCG/ACT nasal spray Use 2 spray(s) in each nostril once daily 16 g 5   furosemide (LASIX) 20 MG tablet Take 1 tablet (20 mg total) by mouth as needed for edema. 90 tablet 3   gabapentin (NEURONTIN) 300 MG capsule Take 300 mg by mouth. 2cap in the morning, 2cap at noon, and 3caps at bedtime     mupirocin ointment (BACTROBAN) 2 % Apply thin film twice daily. 22 g 0   nitroGLYCERIN (NITROSTAT) 0.4 MG SL tablet Place 1 tablet (0.4 mg total) under the tongue  every 5 (five) minutes as needed for chest pain. 25 tablet 1   nystatin cream (MYCOSTATIN) Apply 1 application topically 2 (two) times daily. 30 g 2   pantoprazole (PROTONIX) 40 MG tablet Take 1 tablet (40 mg total) by mouth daily. 90 tablet 1   pentosan polysulfate (ELMIRON) 100 MG capsule Take 1 capsule (100 mg total) by mouth 3 (three) times daily. Reported on 01/05/2016 (Patient taking differently: Take 100 mg by mouth 3 (three) times daily with meals as needed. Reported on 01/05/2016) 90 capsule 11   polyethylene glycol (MIRALAX) 17 g packet Take 17 g by mouth daily. 14 each 0   sucralfate (CARAFATE) 1 GM/10ML suspension Take 10 mLs (1 g total) by mouth 4 (four) times daily -  with meals and at bedtime. 420 mL 1   traZODone (DESYREL) 100 MG tablet TAKE 1 TABLET BY MOUTH AT BEDTIME 90 tablet 0   REPATHA SURECLICK 195 MG/ML SOAJ INJECT 140 MG SUBCUTANEOUSLY EVERY 14 DAYS 2 mL 2   hyoscyamine (LEVSIN SL) 0.125 MG SL tablet Place 1 tablet (0.125 mg total) under the tongue every 4 (four) hours as needed. (Patient not taking: Reported on 07/14/2021) 30 tablet 0   losartan (COZAAR) 100 MG tablet Take 1 tablet  by mouth once daily 90 tablet 3   pramipexole (MIRAPEX) 0.125 MG tablet Take 1-2 tablets (0.125-0.25 mg total) by mouth in the morning and at bedtime. (Patient not taking: No sig reported) 60 tablet 5   benzonatate (TESSALON PERLES) 100 MG capsule Take 1 capsule (100 mg total) by mouth 3 (three) times daily as needed. 20 capsule 0   famotidine (PEPCID) 40 MG tablet Take 1 tablet (40 mg total) by mouth at bedtime. 90 tablet 1   loratadine (CLARITIN) 10 MG tablet Take 1 tablet (10 mg total) by mouth daily. 30 tablet 11   methylPREDNISolone (MEDROL) 4 MG tablet 5 tabs po daily x 3 days then 4 tabs po daily x 3 days then 2 tabs po daily x 3 days then 1 tab po daily x 3 days then 1/2 po daily x 3 days and stop 38 tablet 0   predniSONE (DELTASONE) 10 MG tablet 4 tabs by mouth once daily for 2 days, then 3  tabs daily x 2 days, then 2 tabs daily x 2 days, then 1 tab daily x 2 days 20 tablet 0   promethazine-dextromethorphan (PROMETHAZINE-DM) 6.25-15 MG/5ML syrup Take 5 mLs by mouth 3 (three) times daily as needed for cough. (Patient not taking: No sig reported) 240 mL 0   sulfamethoxazole-trimethoprim (BACTRIM DS) 800-160 MG tablet Take 1 tablet by mouth 2 (two) times daily. 20 tablet 0   trimethoprim-polymyxin b (POLYTRIM) ophthalmic solution Place 1 drop into both eyes every 6 (six) hours. (Patient not taking: Reported on 07/14/2021) 10 mL 0   bupivacaine (MARCAINE) 0.5 % 10 mL, triamcinolone acetonide (KENALOG-40) 40 mg injection      bupivacaine (MARCAINE) 0.5 % 15 mL, phenazopyridine (PYRIDIUM) 400 mg bladder mixture      No facility-administered medications prior to visit.    Allergies  Allergen Reactions   Clindamycin Rash and Shortness Of Breath   Clindamycin Hcl Shortness Of Breath and Rash   Penicillins Anaphylaxis   Rosuvastatin Anaphylaxis   Baclofen Other (See Comments) and Rash   Lincomycin Other (See Comments)   Clindamycin Hcl     Other reaction(s): Shortness of Breath, Rash   Codeine Hives and Other (See Comments)    headache Other reaction(s): Headache, Nausea, headache   Erythromycin Hives   Erythromycin Base Other (See Comments)    other   Fluzone [Influenza Virus Vaccine] Other (See Comments)    Local reaction at the site   Haemophilus Influenzae Other (See Comments)    Local reaction at the site Local reaction at the site   Haemophilus Influenzae Vaccines Other (See Comments)    Local reaction at site   Latex Hives   Other Other (See Comments)    Local reaction at site   Pentazocine Other (See Comments)   Pentazocine Lactate Other (See Comments)    HALLUCINATION   Pneumococcal Vaccine Hives and Swelling    Other reaction(s): Hives, Shortness of Breath   Pneumococcal Vaccine Polyvalent Hives, Swelling and Other (See Comments)    REACTION: redness, swelling,  and hives at injection site   Tamoxifen Nausea And Vomiting and Other (See Comments)    HEADACHE Other reaction(s): Headache, Nausea    Review of Systems  Constitutional:  Positive for malaise/fatigue. Negative for fever.  HENT:  Positive for congestion and sinus pain.   Eyes:  Negative for blurred vision.  Respiratory:  Positive for cough and sputum production. Negative for shortness of breath.   Cardiovascular:  Negative for chest pain, palpitations and  leg swelling.  Gastrointestinal:  Negative for abdominal pain, blood in stool and nausea.  Genitourinary:  Positive for frequency. Negative for dysuria.  Musculoskeletal:  Positive for myalgias. Negative for falls.  Skin:  Negative for rash.  Neurological:  Positive for headaches. Negative for dizziness and loss of consciousness.  Endo/Heme/Allergies:  Negative for environmental allergies.  Psychiatric/Behavioral:  Negative for depression. The patient is not nervous/anxious.       Objective:    Physical Exam Constitutional:      General: She is not in acute distress.    Appearance: She is well-developed.  HENT:     Head: Normocephalic and atraumatic.  Eyes:     Conjunctiva/sclera: Conjunctivae normal.  Neck:     Thyroid: No thyromegaly.  Cardiovascular:     Rate and Rhythm: Normal rate and regular rhythm.     Heart sounds: Normal heart sounds. No murmur heard. Pulmonary:     Effort: Pulmonary effort is normal. No respiratory distress.     Breath sounds: Normal breath sounds.  Abdominal:     General: Bowel sounds are normal. There is no distension.     Palpations: Abdomen is soft. There is no mass.     Tenderness: There is no abdominal tenderness.  Musculoskeletal:     Cervical back: Neck supple.  Lymphadenopathy:     Cervical: No cervical adenopathy.  Skin:    General: Skin is warm and dry.  Neurological:     Mental Status: She is alert and oriented to person, place, and time.  Psychiatric:        Behavior:  Behavior normal.    BP 114/68   Pulse (!) 51   Temp 98.8 F (37.1 C)   Resp 16   Wt 140 lb 3.2 oz (63.6 kg)   SpO2 90%   BMI 23.69 kg/m  Wt Readings from Last 3 Encounters:  07/21/21 140 lb 3.2 oz (63.6 kg)  07/14/21 139 lb 12.8 oz (63.4 kg)  06/07/21 137 lb 9.6 oz (62.4 kg)    Diabetic Foot Exam - Simple   No data filed    Lab Results  Component Value Date   WBC 7.8 07/21/2021   HGB 12.1 07/21/2021   HCT 37.2 07/21/2021   PLT 294.0 07/21/2021   GLUCOSE 98 07/21/2021   CHOL 132 03/08/2021   TRIG 112.0 03/08/2021   HDL 46.50 03/08/2021   LDLDIRECT 117.3 04/08/2012   LDLCALC 63 03/08/2021   ALT 10 07/21/2021   AST 16 07/21/2021   NA 140 07/21/2021   K 3.9 07/21/2021   CL 103 07/21/2021   CREATININE 0.99 07/21/2021   BUN 12 07/21/2021   CO2 31 07/21/2021   TSH 3.77 07/21/2021   INR 1.05 08/03/2010   HGBA1C 5.9 07/21/2021   MICROALBUR 0.2 07/10/2013    Lab Results  Component Value Date   TSH 3.77 07/21/2021   Lab Results  Component Value Date   WBC 7.8 07/21/2021   HGB 12.1 07/21/2021   HCT 37.2 07/21/2021   MCV 93.3 07/21/2021   PLT 294.0 07/21/2021   Lab Results  Component Value Date   NA 140 07/21/2021   K 3.9 07/21/2021   CHLORIDE 102 04/20/2015   CO2 31 07/21/2021   GLUCOSE 98 07/21/2021   BUN 12 07/21/2021   CREATININE 0.99 07/21/2021   BILITOT 0.4 07/21/2021   ALKPHOS 103 07/21/2021   AST 16 07/21/2021   ALT 10 07/21/2021   PROT 6.2 07/21/2021   ALBUMIN 3.8 07/21/2021  CALCIUM 8.9 07/21/2021   ANIONGAP 12 (H) 04/20/2015   EGFR 49 (L) 04/20/2015   GFR 55.56 (L) 07/21/2021   Lab Results  Component Value Date   CHOL 132 03/08/2021   Lab Results  Component Value Date   HDL 46.50 03/08/2021   Lab Results  Component Value Date   LDLCALC 63 03/08/2021   Lab Results  Component Value Date   TRIG 112.0 03/08/2021   Lab Results  Component Value Date   CHOLHDL 3 03/08/2021   Lab Results  Component Value Date   HGBA1C 5.9  07/21/2021       Assessment & Plan:   Problem List Items Addressed This Visit     GERD (gastroesophageal reflux disease) (Chronic)    Avoid offending foods, start probiotics. Do not eat large meals in late evening and consider raising head of bed. Improved with Sucralfate and esophagus was stretched about 3 months ago and she is already having some trouble swallowing again.        Essential hypertension    Well controlled, no changes to meds. Encouraged heart healthy diet such as the DASH diet and exercise as tolerated.        Relevant Orders   TSH (Completed)   Comprehensive metabolic panel (Completed)   Sedimentation rate (Completed)   Asthma, allergic    Very pleased with her visit with pulmonology, Dr Lamonte Sakai was very complete in his history and they have scheduled her for a CT chest and some PFTs and then a follow up appointment. Still coughing but she has moved her nasal steroid to the morning.       Arthralgia    Left fifth finger arthritic changes recently       Renal insufficiency    Hydrate and monitor       Hyperlipidemia    Statin intolerant. Encourage heart healthy diet such as MIND or DASH diet, increase exercise, avoid trans fats, simple carbohydrates and processed foods, consider a krill or fish or flaxseed oil cap daily.        Recurrent sinusitis    Saw Dr Benjamine Mola awaiting CT of sinuses to decide on further treatment. Given a round of treatment with Doxycycline and Medrol dose.        Relevant Medications   promethazine-dextromethorphan (PROMETHAZINE-DM) 6.25-15 MG/5ML syrup   Statin intolerance    Does not tolerate statins so will continue to monitor       OSA and COPD overlap syndrome ([place])    She is established with Dr Lamonte Sakai of pulmonary and is now due for a CT chest to further investigate. She is very pleased with the pro0gress of her work up.        Relevant Medications   promethazine-dextromethorphan (PROMETHAZINE-DM) 6.25-15 MG/5ML syrup    Vitamin B12 deficiency    Supplement and monitor       Hyperglycemia    hgba1c acceptable, minimize simple carbs. Increase exercise as tolerated.        Relevant Orders   Hemoglobin A1c (Completed)   Other Visit Diagnoses     Leukocytosis, unspecified type    -  Primary   Relevant Orders   CBC with Differential/Platelet (Completed)   Comprehensive metabolic panel (Completed)   Sedimentation rate (Completed)       I have discontinued Christean Grief. Orozco's loratadine, benzonatate, famotidine, predniSONE, trimethoprim-polymyxin b, sulfamethoxazole-trimethoprim, and methylPREDNISolone. I am also having her maintain her acetaminophen, aspirin EC, pentosan polysulfate, denosumab, mupirocin ointment, nitroGLYCERIN, furosemide, Estradiol, hyoscyamine, gabapentin, amLODipine,  pramipexole, losartan, polyethylene glycol, fluticasone, Vitamin B-12, albuterol, pantoprazole, atenolol, DULoxetine, nystatin cream, sucralfate, traZODone, and promethazine-dextromethorphan. We will stop administering (bupivacaine (MARCAINE) 0.5 % 15 mL, phenazopyridine (PYRIDIUM) 400 mg bladder mixture) and (bupivacaine (MARCAINE) 0.5 % 10 mL, triamcinolone acetonide (KENALOG-40) 40 mg injection).  Meds ordered this encounter  Medications   promethazine-dextromethorphan (PROMETHAZINE-DM) 6.25-15 MG/5ML syrup    Sig: Take 5 mLs by mouth 3 (three) times daily as needed for cough.    Dispense:  240 mL    Refill:  0     [name], MD

## 2021-07-21 NOTE — Assessment & Plan Note (Signed)
Left fifth finger arthritic changes recently

## 2021-07-21 NOTE — Assessment & Plan Note (Signed)
Supplement and monitor 

## 2021-07-21 NOTE — Assessment & Plan Note (Addendum)
Saw Dr Benjamine Mola awaiting CT of sinuses to decide on further treatment. Given a round of treatment with Doxycycline and Medrol dose.

## 2021-07-21 NOTE — Assessment & Plan Note (Signed)
Hydrate and monitor 

## 2021-07-21 NOTE — Assessment & Plan Note (Signed)
Well controlled, no changes to meds. Encouraged heart healthy diet such as the DASH diet and exercise as tolerated.  °

## 2021-07-21 NOTE — Assessment & Plan Note (Signed)
Does not tolerate statins so will continue to monitor

## 2021-07-21 NOTE — Assessment & Plan Note (Signed)
Avoid offending foods, start probiotics. Do not eat large meals in late evening and consider raising head of bed. Improved with Sucralfate and esophagus was stretched about 3 months ago and she is already having some trouble swallowing again.

## 2021-07-21 NOTE — Patient Instructions (Signed)
Heartburn Heartburn is a type of pain or discomfort that can happen in the throat or chest. It is often described as a burning pain. It may also cause a bad, acid-like taste in the mouth. Heartburn may feel worse when you lie down or bend over, and it is often worse at night. Heartburn may be caused by stomach contents that move back up into the esophagus (reflux). Follow these instructions at home: Eating and drinking  Avoid certain foods and drinks as told by your health care provider. This may include: Coffee and tea, with or without caffeine. Drinks that contain alcohol. Energy drinks and sports drinks. Carbonated drinks or sodas. Chocolate and cocoa. Peppermint and mint flavorings. Garlic and onions. Horseradish. Spicy and acidic foods, including peppers, chili powder, curry powder, vinegar, hot sauces, and barbecue sauce. Citrus fruit juices and citrus fruits, such as oranges, lemons, and limes. Tomato-based foods, such as red sauce, chili, salsa, and pizza with red sauce. Fried and fatty foods, such as donuts, french fries, potato chips, and high-fat dressings. High-fat meats, such as hot dogs and fatty cuts of red and white meats, such as rib eye steak, sausage, ham, and bacon. High-fat dairy items, such as whole milk, butter, and cream cheese. Eat small, frequent meals instead of large meals. Avoid drinking large amounts of liquid with your meals. Avoid eating meals during the 2-3 hours before bedtime. Avoid lying down right after you eat. Do not exercise right after you eat.  Lifestyle     If you are overweight, reduce your weight to an amount that is healthy for you. Ask your health care provider for guidance about a safe weight loss goal. Do not use any products that contain nicotine or tobacco. These products include cigarettes, chewing tobacco, and vaping devices, such as e-cigarettes. These can make symptoms worse. If you need help quitting, ask your health care  provider. Wear loose-fitting clothing. Do not wear anything tight around your waist that causes pressure on your abdomen. Raise (elevate) the head of your bed about 6 inches (15 cm) when you sleep. You can use a wedge to do this. Try to reduce your stress, such as with yoga or meditation. If you need help reducing stress, ask your health care provider. Medicines Take over-the-counter and prescription medicines only as told by your health care provider. Do not take aspirin or NSAIDs, such as ibuprofen, unless your health care provider told you to do so. Stop medicines only as told by your health care provider. If you stop taking some medicines too quickly, your symptoms may get worse. General instructions Pay attention to any changes in your symptoms. Keep all follow-up visits. This is important. Contact a health care provider if: You have new symptoms. You have unexplained weight loss. You have difficulty swallowing, or it hurts to swallow. You have wheezing or a persistent cough. Your symptoms do not improve with treatment. You have frequent heartburn for more than 2 weeks. Get help right away if: You suddenly have pain in your arms, neck, jaw, teeth, or back. You suddenly feel sweaty, dizzy, or light-headed. You have chest pain or shortness of breath. You vomit and your vomit looks like blood or coffee grounds. Your stool is bloody or black. These symptoms may represent a serious problem that is an emergency. Do not wait to see if the symptoms will go away. Get medical help right away. Call your local emergency services (911 in the [place] Do not drive yourself to the hospital. Summary  Heartburn is a type of pain or discomfort that can happen in the throat or chest. It is often described as a burning pain. It may also cause a bad, acid-like taste in the mouth. Avoid certain foods and drinks as told by your health care provider. Take over-the-counter and prescription medicines only as  told by your health care provider. Do not take aspirin or NSAIDs, such as ibuprofen, unless your health care provider told you to do so. Contact a health care provider if your symptoms do not improve or they get worse. This information is not intended to replace advice given to you by your health care provider. Make sure you discuss any questions you have with your healthcare provider. Document Revised: 06/16/2020 Document Reviewed: 06/16/2020 Elsevier Patient Education  [place].

## 2021-07-21 NOTE — Assessment & Plan Note (Addendum)
Very pleased with her visit with pulmonology, Dr Lamonte Sakai was very complete in his history and they have scheduled her for a CT chest and some PFTs and then a follow up appointment. Still coughing but she has moved her nasal steroid to the morning.

## 2021-07-22 ENCOUNTER — Other Ambulatory Visit: Payer: Self-pay | Admitting: Family Medicine

## 2021-07-22 MED ORDER — METHYLPREDNISOLONE 4 MG PO TABS: ORAL_TABLET | ORAL | 0 refills | Status: DC

## 2021-07-22 MED ORDER — DOXYCYCLINE HYCLATE 100 MG PO TABS: 100.0000 mg | ORAL_TABLET | Freq: Two times a day (BID) | ORAL | 0 refills | Status: DC

## 2021-07-24 NOTE — Assessment & Plan Note (Signed)
Statin intolerant. Encourage heart healthy diet such as MIND or DASH diet, increase exercise, avoid trans fats, simple carbohydrates and processed foods, consider a krill or fish or flaxseed oil cap daily.

## 2021-07-24 NOTE — Assessment & Plan Note (Signed)
[id number] acceptable, minimize simple carbs. Increase exercise as tolerated.  

## 2021-07-24 NOTE — Assessment & Plan Note (Signed)
She is established with Dr Lamonte Sakai of pulmonary and is now due for a CT chest to further investigate. She is very pleased with the pro0gress of her work up.

## 2021-08-03 ENCOUNTER — Ambulatory Visit
Admission: RE | Admit: 2021-08-03 | Discharge: 2021-08-03 | Disposition: A | Discharge status: Home / Self Care | Payer: Medicare Other | Source: Ambulatory Visit | Attending: Otolaryngology | Admitting: Otolaryngology

## 2021-08-03 ENCOUNTER — Other Ambulatory Visit: Payer: Self-pay

## 2021-08-03 ENCOUNTER — Telehealth: Payer: Self-pay | Admitting: *Deleted

## 2021-08-03 DIAGNOSIS — J329 Chronic sinusitis, unspecified: Secondary | ICD-10-CM | POA: Diagnosis not present

## 2021-08-03 DIAGNOSIS — J342 Deviated nasal septum: Secondary | ICD-10-CM | POA: Diagnosis not present

## 2021-08-03 NOTE — Telephone Encounter (Signed)
Prolia Given 02/22/2021 Next injection 08/26/2021

## 2021-08-03 NOTE — Telephone Encounter (Addendum)
Deductible n/a  OOP MAX $4200 [phone])  Annual exam 02/22/2021  Calcium  8.9           Date 07/21/2021  Upcoming dental procedures NO  Prior Authorization needed No PA needed per representative reference [id number]  Pt estimated Cost $213       Coverage Details: 20% one dose   SPOKE WITH PATIENT SHE STATES SHE IS DECIDING ON IF SHE WILL STILL WANTS TO USE PROLIA DUE TO COST/ RESEARCH PATIENT STATES SHE WILL CALL us BACK IF SHE WANTS TO PROCEED. I WILL CLOSE ENCOUNTER

## 2021-08-08 ENCOUNTER — Telehealth: Payer: Self-pay

## 2021-08-08 DIAGNOSIS — J310 Chronic rhinitis: Secondary | ICD-10-CM | POA: Diagnosis not present

## 2021-08-08 DIAGNOSIS — J342 Deviated nasal septum: Secondary | ICD-10-CM | POA: Diagnosis not present

## 2021-08-08 DIAGNOSIS — J343 Hypertrophy of nasal turbinates: Secondary | ICD-10-CM | POA: Diagnosis not present

## 2021-08-08 NOTE — Telephone Encounter (Signed)
Calling regarding PA for Prolia '60mg'$ .  There are questions that need to be answered to complete the PA.  Please call her.

## 2021-08-10 NOTE — Telephone Encounter (Signed)
No PA needed per representative reference [id number]

## 2021-08-10 NOTE — Telephone Encounter (Signed)
[name] with [employer] Medicare Appeal calling about expedited appeal. Has questions:  #1  Is patient's calcium level normal?  #2 What is patient's calcium level?  #3 Will patient be using Prolia along with a list of of other meds one of which was Forteo. I was not familiar with the others and she spoke the list very quickly.  If you get her voice mail you can leave detailed message.

## 2021-08-18 ENCOUNTER — Ambulatory Visit (INDEPENDENT_AMBULATORY_CARE_PROVIDER_SITE_OTHER): Payer: Medicare Other | Admitting: Emergency Medicine

## 2021-08-18 ENCOUNTER — Other Ambulatory Visit: Payer: Self-pay

## 2021-08-18 ENCOUNTER — Encounter: Payer: Self-pay | Admitting: Emergency Medicine

## 2021-08-18 ENCOUNTER — Ambulatory Visit: Payer: Medicare Other | Admitting: Emergency Medicine

## 2021-08-18 DIAGNOSIS — J45909 Unspecified asthma, uncomplicated: Secondary | ICD-10-CM

## 2021-08-18 DIAGNOSIS — R053 Chronic cough: Secondary | ICD-10-CM

## 2021-08-18 DIAGNOSIS — J309 Allergic rhinitis, unspecified: Secondary | ICD-10-CM

## 2021-08-18 DIAGNOSIS — K219 Gastro-esophageal reflux disease without esophagitis: Secondary | ICD-10-CM | POA: Diagnosis not present

## 2021-08-18 MED ORDER — ALBUTEROL SULFATE HFA 108 (90 BASE) MCG/ACT IN AERS: 2.0000 | INHALATION_SPRAY | RESPIRATORY_TRACT | 0 refills | Status: DC | PRN

## 2021-08-18 NOTE — Assessment & Plan Note (Signed)
Continue cetirizine and fluticasone nasal spray

## 2021-08-18 NOTE — Progress Notes (Signed)
Subjective:    Patient ID: [name], female    DOB: [birth year], 75 y.o.   MRN: [id number]  HPI 75 year old woman with former tobacco use (10-15 pack years), hypertension, CAD, allergic rhinitis, GERD, breast cancer.  Have seen her in the distant past for chronic cough and asthma.  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  We had also been remotely following a stable 5 mm right upper lobe pulmonary nodule.  She is here to reestablish care, reports today that she has been dealing with chronic cough since, flaring symptoms since she had COVID in 02/2021. Her nasosinus pressure, drainage, cough all increased significantly. Severe cough at the time, improving some but not back to her usual baseline - coughing more and through the day, has mid anterior chest discomfort with the cough, sounds musculoskeletal.  Cough can happen at any time, can wake her at night. She has a coarse voice, some hoarseness.  She saw Dr Benjamine Mola >> No real comment about seeing a cord abnormality. A Ct sinuses is pending. She has albuterol available that she can use if needed - can help with some exposure related SOB. May also help her cough.   She was treated empirically with antibiotics for possible sinusitis as a contributor, also recent steroid taper in June. Added protonix qd, carafate. CXR 05/11/21 was clear. Still having a lot of GERD. She is on flonase and loratadine.    ROV 08/18/21 --[name] is 21 and returns today to discuss her chronic cough which has been flaring since she had COVID-19 in March 2022.  Question whether she was also dealing with contribution of chronic sinusitis.  I changed her loratadine to cetirizine, continue fluticasone nasal spray and increased her pantoprazole temporarily to twice daily.  She underwent pulmonary function testing today.  She reports that she continues to cough but that it has decreased in freq. Seems to bother her more in am and at night when supine. Her GERD is a  lot better, not gone. Very little nasal congestion > better. Some hoarseness. Rare albuterol use, sometimes w exertion. Walking upstairs. She gets jittery with albuterol, wants to avoid scheduled BD if possible.    Pulmonary function testing performed today and reviewed by me shows severe obstruction with a vigorous bronchodilator response, normal lung volumes, decreased diffusion capacity that corrects to normal range when adjusted for alveolar volume.  Her FEV1 is decreased to 0.96 L (45% predicted) from 1.31 L (60% predicted) in October 2013.   Review of Systems As per HPI  Past Medical History:  Diagnosis Date   Allergy    Anemia    Anxiety    past hx    Arthritis    Asthma    Atrial flutter ([place])    past history- not current   Breast cancer ([place])    CAD (coronary artery disease) CARDIOLOGIST--  DR Angelena Form   mild non-obstructive cad   Cancer ([place])    right   Cataract    bilaterally removed    Chronic constipation    Chronic kidney disease    interstitial cystitis   Concussion    x 3   COPD (chronic obstructive pulmonary disease) ([facility])    Depression    past hx    Family history of malignant hyperthermia    father had this   Fibromyalgia    Frequency of urination    GERD (gastroesophageal reflux disease)    H/O hiatal hernia    History  of basal cell carcinoma excision    X2   History of breast cancer ONCOLOGIST-- DR Jana Hakim---  NO RECURRANCE   DX 07/2012;  LOW GRADE DCIS  ER+PR+  ----  S/P RIGHT LUMPECTOMY WITH NEGATIVE MARGINS/   RADIATION ENDED 11/2012   History of chronic bronchitis    History of colonic polyps    History of tachycardia    CONTROLLED  WITH ATENOLOL   Hyperlipidemia    Hypertension    Neuromuscular disorder (H[facility])    fibromyalgia   Neuropathy    Osteoporosis 01/2019   T score -2.2 stable/improved from prior study   Pelvic pain    Personal history of radiation therapy    S/P radiation therapy 11/12/12 - 12/05/12   right Breast   Sepsis  ([place]) 2014   from UTI    Sinus headache    Urgency of urination      Family History  Problem Relation Age of Onset   Rectal cancer Mother    Colon cancer Mother    Pancreatic cancer Mother    Diabetes Mother    Breast cancer Mother 75   Breast cancer Maternal Aunt        breast   Birth defects Maternal Aunt    Irritable bowel syndrome Son    Heart disease Son        CAD, MV replacement   Allergic Disorder Daughter    Diabetes Daughter    Colon cancer Father    Colon polyps Father    Diabetes Father    Stroke Father    Heart disease Father        CHF   Hyperlipidemia Father    Hypertension Father    Arthritis Father    Breast cancer Paternal Aunt    Breast cancer Paternal Aunt    Arthritis Paternal Uncle    Allergic Disorder Daughter    Heart disease Cousin        CAD, had a blood clot after stenting   Esophageal cancer Neg Hx    Stomach cancer Neg Hx      Social History   Socioeconomic History   Marital status: Married    Spouse name: Jori Moll    Number of children: 3   Years of education: BA   Highest education level: Not on file  Occupational History   Occupation: Retired Programmer, multimedia: RETIRED  Tobacco Use   Smoking status: Former    Packs/day: 1.00    Years: 15.00    Pack years: 15.00    Types: Cigarettes    Quit date: 05/27/2005    Years since quitting: 16.2   Smokeless tobacco: Never  Vaping Use   Vaping Use: Never used  Substance and Sexual Activity   Alcohol use: No    Alcohol/week: 0.0 standard drinks   Drug use: No   Sexual activity: Yes    Partners: Male    Comment: 1st intercourse 75 yo-Fewer than 5 partners  Other Topics Concern   Not on file  Social History Narrative   Lives with husband.   Caffeine use: 1/2 cup per day   Exercise-- 2days a week [employer],  Water aerobics, walking       Retired Marine scientist   No dietary restrictions, tries to maintain a heart healthy diet   Social Determinants of Radio broadcast assistant Strain: Not  on file  Food Insecurity: Not on file  Transportation Needs: Not on file  Physical Activity: Not on file  Stress: Not on file  Social Connections: Not on file  Intimate Partner Violence: Not on file     Allergies  Allergen Reactions   Clindamycin Rash and Shortness Of Breath   Clindamycin Hcl Shortness Of Breath and Rash   Penicillins Anaphylaxis   Rosuvastatin Anaphylaxis   Baclofen Other (See Comments) and Rash   Lincomycin Other (See Comments)   Clindamycin Hcl     Other reaction(s): Shortness of Breath, Rash   Codeine Hives and Other (See Comments)    headache Other reaction(s): Headache, Nausea, headache   Erythromycin Hives   Erythromycin Base Other (See Comments)    other   Fluzone [Influenza Virus Vaccine] Other (See Comments)    Local reaction at the site   Haemophilus Influenzae Other (See Comments)    Local reaction at the site Local reaction at the site   Haemophilus Influenzae Vaccines Other (See Comments)    Local reaction at site   Latex Hives   Other Other (See Comments)    Local reaction at site   Pentazocine Other (See Comments)   Pentazocine Lactate Other (See Comments)    HALLUCINATION   Pneumococcal Vaccine Hives and Swelling    Other reaction(s): Hives, Shortness of Breath   Pneumococcal Vaccine Polyvalent Hives, Swelling and Other (See Comments)    REACTION: redness, swelling, and hives at injection site   Tamoxifen Nausea And Vomiting and Other (See Comments)    HEADACHE Other reaction(s): Headache, Nausea     Outpatient Medications Prior to Visit  Medication Sig Dispense Refill   acetaminophen (TYLENOL) 500 MG tablet Take 1,000 mg by mouth every 6 (six) hours as needed for mild pain or headache.      albuterol (VENTOLIN HFA) 108 (90 Base) MCG/ACT inhaler Inhale 2 puffs into the lungs every 4 (four) hours as needed. 1 each 0   amLODipine (NORVASC) 10 MG tablet Take 1 tablet (10 mg total) by mouth daily. 90 tablet 3   aspirin EC 81 MG tablet  Take 1 tablet (81 mg total) by mouth daily. 90 tablet 3   atenolol (TENORMIN) 50 MG tablet Take 1 tablet (50 mg total) by mouth 2 (two) times daily. 180 tablet 1   Cyanocobalamin (VITAMIN B-12) 5000 MCG TBDP Take 1 tablet by mouth daily with breakfast.     denosumab (PROLIA) 60 MG/ML SOSY injection Inject 60 mg into the skin every 6 (six) months.     DULoxetine (CYMBALTA) 60 MG capsule Take 2 capsules (120 mg total) by mouth daily. 180 capsule 1   Estradiol 10 MCG TABS vaginal tablet Place 1 tablet (10 mcg total) vaginally 2 (two) times a week. 24 tablet 4   fluticasone (FLONASE) 50 MCG/ACT nasal spray Use 2 spray(s) in each nostril once daily 16 g 5   furosemide (LASIX) 20 MG tablet Take 1 tablet (20 mg total) by mouth as needed for edema. 90 tablet 3   gabapentin (NEURONTIN) 300 MG capsule Take 300 mg by mouth. 2cap in the morning, 2cap at noon, and 3caps at bedtime     hyoscyamine (LEVSIN SL) 0.125 MG SL tablet Place 1 tablet (0.125 mg total) under the tongue every 4 (four) hours as needed. 30 tablet 0   losartan (COZAAR) 100 MG tablet Take 1 tablet by mouth once daily 90 tablet 3   mupirocin ointment (BACTROBAN) 2 % Apply thin film twice daily. 22 g 0   nitroGLYCERIN (NITROSTAT) 0.4 MG SL tablet Place 1 tablet (0.4 mg total) under the tongue  every 5 (five) minutes as needed for chest pain. 25 tablet 1   nystatin cream (MYCOSTATIN) Apply 1 application topically 2 (two) times daily. 30 g 2   pantoprazole (PROTONIX) 40 MG tablet Take 1 tablet (40 mg total) by mouth daily. 90 tablet 1   pentosan polysulfate (ELMIRON) 100 MG capsule Take 1 capsule (100 mg total) by mouth 3 (three) times daily. Reported on 01/05/2016 (Patient taking differently: Take 100 mg by mouth 3 (three) times daily with meals as needed. Reported on 01/05/2016) 90 capsule 11   polyethylene glycol (MIRALAX) 17 g packet Take 17 g by mouth daily. 14 each 0   pramipexole (MIRAPEX) 0.125 MG tablet Take 1-2 tablets (0.125-0.25 mg total)  by mouth in the morning and at bedtime. 60 tablet 5   promethazine-dextromethorphan (PROMETHAZINE-DM) 6.25-15 MG/5ML syrup Take 5 mLs by mouth 3 (three) times daily as needed for cough. 240 mL 0   REPATHA SURECLICK [id number] MG/ML SOAJ INJECT 140 MG SUBCUTANEOUSLY EVERY 14 DAYS 2 mL 11   sucralfate (CARAFATE) 1 GM/10ML suspension Take 10 mLs (1 g total) by mouth 4 (four) times daily -  with meals and at bedtime. 420 mL 1   traZODone (DESYREL) 100 MG tablet TAKE 1 TABLET BY MOUTH AT BEDTIME 90 tablet 0   doxycycline (VIBRA-TABS) 100 MG tablet Take 1 tablet (100 mg total) by mouth 2 (two) times daily. 20 tablet 0   methylPREDNISolone (MEDROL) 4 MG tablet 5 tabs po x 1 day then 4 tabs po x 1 day then 3 tabs po x 1 day then 2 tabs po x 1 day then 1 tab po x 1 day and stop 15 tablet 0   No facility-administered medications prior to visit.          Objective:   Physical Exam  Vitals:   08/18/21 1020  BP: 124/72  Pulse: 72  Temp: 97.8 F (36.6 C)  TempSrc: Oral  SpO2: 94%  Weight: 139 lb (63 kg)  Height: 5' 4.5" (1.638 m)   Gen: Pleasant, well-nourished, in no distress,  normal affect  ENT: No lesions,  mouth clear,  oropharynx clear, no postnasal drip, somewhat hoarse voice  Neck: No JVD, no stridor  Lungs: No use of accessory muscles, no crackles or wheezing on normal respiration, no wheeze on forced expiration  Cardiovascular: RRR, late systolic murmur with intact S2  Musculoskeletal: No deformities, no cyanosis or clubbing.    Neuro: alert, awake, non focal  Skin: Warm, no lesions or rashes     Assessment & Plan:  Asthma, allergic Her obstruction and bronchodilator response on pulmonary function testing today is impressive.  She does have some exertional symptoms.  Discussed possibly starting scheduled bronchodilator therapy, ICS with her.  She is concerned about possible side effects, jitteriness.  Also given her upper airway irritation schedule medication might exacerbate her  cough.  For now we agreed to avoid scheduled BD, continue albuterol as needed.  Allergic rhinitis Continue cetirizine and fluticasone nasal spray  GERD (gastroesophageal reflux disease) Her GERD is better as is her cough.  Both are still happening some.  Talked her today about the potential side effects associated with long-term PPI use.  For now we will continue pantoprazole twice daily but at some point it may be feasible to decrease back down to once daily.  Chronic cough Treating GERD, reflux as above.  Avoiding scheduled BD/ICS for now.  She would do poorly with a powdered formulation if we do need to start  one.  Baltazar Apo, MD, PhD 08/18/2021, 10:49 AM [facility] [phone] or if no answer before 7:00PM call [phone] For any issues after 7:00PM please call eLink [phone]

## 2021-08-18 NOTE — Assessment & Plan Note (Signed)
Her obstruction and bronchodilator response on pulmonary function testing today is impressive.  She does have some exertional symptoms.  Discussed possibly starting scheduled bronchodilator therapy, ICS with her.  She is concerned about possible side effects, jitteriness.  Also given her upper airway irritation schedule medication might exacerbate her cough.  For now we agreed to avoid scheduled BD, continue albuterol as needed.

## 2021-08-18 NOTE — Patient Instructions (Signed)
Full PFT performed today. °

## 2021-08-18 NOTE — Assessment & Plan Note (Signed)
Treating GERD, reflux as above.  Avoiding scheduled BD/ICS for now.  She would do poorly with a powdered formulation if we do need to start one.

## 2021-08-18 NOTE — Addendum Note (Signed)
Addended by: Gavin Potters R on: 08/18/2021 11:15 AM   Modules accepted: Orders

## 2021-08-18 NOTE — Assessment & Plan Note (Signed)
Her GERD is better as is her cough.  Both are still happening some.  Talked her today about the potential side effects associated with long-term PPI use.  For now we will continue pantoprazole twice daily but at some point it may be feasible to decrease back down to once daily.

## 2021-08-18 NOTE — Patient Instructions (Signed)
Continue cetirizine once daily. Continue your fluticasone nasal spray, 2 sprays each nostril once daily.  Take this medication in the morning. Believe that your active reflux is contributing to your daily cough.  Continue your pantoprazole 40 mg twice a day for now.  Depending on how your cough does over the next several months it may be possible to decrease this back down to 1 time a day. We will hold off on starting a scheduled inhaled medication for now. Keep your albuterol available use 2 puffs if needed for shortness of breath, chest tightness, wheezing. Follow with Dr Lamonte Sakai in 6 months or sooner if you have any problems

## 2021-08-18 NOTE — Progress Notes (Signed)
Full PFT performed today. °

## 2021-08-22 ENCOUNTER — Encounter: Payer: Self-pay | Admitting: Family Medicine

## 2021-08-26 ENCOUNTER — Other Ambulatory Visit: Payer: Self-pay

## 2021-08-26 DIAGNOSIS — M2550 Pain in unspecified joint: Secondary | ICD-10-CM

## 2021-08-26 DIAGNOSIS — M810 Age-related osteoporosis without current pathological fracture: Secondary | ICD-10-CM

## 2021-08-26 DIAGNOSIS — E785 Hyperlipidemia, unspecified: Secondary | ICD-10-CM

## 2021-08-26 DIAGNOSIS — I10 Essential (primary) hypertension: Secondary | ICD-10-CM

## 2021-08-26 DIAGNOSIS — R739 Hyperglycemia, unspecified: Secondary | ICD-10-CM

## 2021-08-26 DIAGNOSIS — E538 Deficiency of other specified B group vitamins: Secondary | ICD-10-CM

## 2021-08-26 DIAGNOSIS — M791 Myalgia, unspecified site: Secondary | ICD-10-CM

## 2021-08-26 DIAGNOSIS — R7989 Other specified abnormal findings of blood chemistry: Secondary | ICD-10-CM

## 2021-08-26 LAB — PULMONARY FUNCTION TEST
DL/VA % pred: 115 %
DL/VA: 4.71 ml/min/mmHg/L
DLCO cor % pred: 73 %
DLCO cor: 14.33 ml/min/mmHg
DLCO unc % pred: 70 %
DLCO unc: 13.72 ml/min/mmHg
FEF 25-75 Post: 1.18 L/sec
FEF 25-75 Pre: 0.62 L/sec
FEF2575-%Change-Post: 88 %
FEF2575-%Pred-Post: 71 %
FEF2575-%Pred-Pre: 38 %
FEV1-%Change-Post: 26 %
FEV1-%Pred-Post: 57 %
FEV1-%Pred-Pre: 45 %
FEV1-Post: 1.22 L
FEV1-Pre: 0.96 L
FEV1FVC-%Change-Post: -6 %
FEV1FVC-%Pred-Pre: 90 %
FEV6-%Change-Post: 36 %
FEV6-%Pred-Post: 71 %
FEV6-%Pred-Pre: 52 %
FEV6-Post: 1.94 L
FEV6-Pre: 1.42 L
FEV6FVC-%Change-Post: 0 %
FEV6FVC-%Pred-Post: 105 %
FEV6FVC-%Pred-Pre: 104 %
FVC-%Change-Post: 36 %
FVC-%Pred-Post: 68 %
FVC-%Pred-Pre: 50 %
FVC-Post: 1.94 L
FVC-Pre: 1.42 L
Post FEV1/FVC ratio: 63 %
Post FEV6/FVC ratio: 100 %
Pre FEV1/FVC ratio: 68 %
Pre FEV6/FVC Ratio: 100 %
RV % pred: 127 %
RV: 2.99 L
TLC % pred: 93 %
TLC: 4.8 L

## 2021-08-26 NOTE — Telephone Encounter (Signed)
Pt is scheduled for lab appointment. I was wondering what days would work best for the 1 or 4pm. I told her I would call back to schedule that appointment.

## 2021-08-31 NOTE — Telephone Encounter (Signed)
/  Pt scheduled for 09/06/21

## 2021-09-02 ENCOUNTER — Other Ambulatory Visit: Payer: Self-pay

## 2021-09-02 ENCOUNTER — Other Ambulatory Visit (INDEPENDENT_AMBULATORY_CARE_PROVIDER_SITE_OTHER): Payer: Medicare Other

## 2021-09-02 DIAGNOSIS — M2550 Pain in unspecified joint: Secondary | ICD-10-CM

## 2021-09-02 DIAGNOSIS — M810 Age-related osteoporosis without current pathological fracture: Secondary | ICD-10-CM | POA: Diagnosis not present

## 2021-09-02 DIAGNOSIS — R739 Hyperglycemia, unspecified: Secondary | ICD-10-CM | POA: Diagnosis not present

## 2021-09-02 DIAGNOSIS — E538 Deficiency of other specified B group vitamins: Secondary | ICD-10-CM

## 2021-09-02 DIAGNOSIS — I10 Essential (primary) hypertension: Secondary | ICD-10-CM | POA: Diagnosis not present

## 2021-09-02 DIAGNOSIS — M791 Myalgia, unspecified site: Secondary | ICD-10-CM

## 2021-09-02 DIAGNOSIS — R7989 Other specified abnormal findings of blood chemistry: Secondary | ICD-10-CM

## 2021-09-02 LAB — CBC WITH DIFFERENTIAL/PLATELET
Basophils Absolute: 0.1 10*3/uL (ref 0.0–0.1)
Basophils Relative: 1.2 % (ref 0.0–3.0)
Eosinophils Absolute: 0.3 10*3/uL (ref 0.0–0.7)
Eosinophils Relative: 3.7 % (ref 0.0–5.0)
HCT: 36.4 % (ref 36.0–46.0)
Hemoglobin: 11.6 g/dL — ABNORMAL LOW (ref 12.0–15.0)
Lymphocytes Relative: 16.9 % (ref 12.0–46.0)
Lymphs Abs: 1.2 10*3/uL (ref 0.7–4.0)
MCHC: 31.8 g/dL (ref 30.0–36.0)
MCV: 93.8 fl (ref 78.0–100.0)
Monocytes Absolute: 0.6 10*3/uL (ref 0.1–1.0)
Monocytes Relative: 7.7 % (ref 3.0–12.0)
Neutro Abs: 5.1 10*3/uL (ref 1.4–7.7)
Neutrophils Relative %: 70.5 % (ref 43.0–77.0)
Platelets: 268 10*3/uL (ref 150.0–400.0)
RBC: 3.88 Mil/uL (ref 3.87–5.11)
RDW: 15.9 % — ABNORMAL HIGH (ref 11.5–15.5)
WBC: 7.2 10*3/uL (ref 4.0–10.5)

## 2021-09-02 LAB — SEDIMENTATION RATE: Sed Rate: 28 mm/hr (ref 0–30)

## 2021-09-02 LAB — COMPREHENSIVE METABOLIC PANEL
ALT: 9 U/L (ref 0–35)
AST: 16 U/L (ref 0–37)
Albumin: 3.5 g/dL (ref 3.5–5.2)
Alkaline Phosphatase: 85 U/L (ref 39–117)
BUN: 11 mg/dL (ref 6–23)
CO2: 32 mEq/L (ref 19–32)
Calcium: 8.7 mg/dL (ref 8.4–10.5)
Chloride: 104 mEq/L (ref 96–112)
Creatinine, Ser: 1.04 mg/dL (ref 0.40–1.20)
GFR: 52.33 mL/min — ABNORMAL LOW (ref >60.00)
Glucose, Bld: 109 mg/dL — ABNORMAL HIGH (ref 70–99)
Potassium: 3.9 mEq/L (ref 3.5–5.1)
Sodium: 143 mEq/L (ref 135–145)
Total Bilirubin: 0.4 mg/dL (ref 0.2–1.2)
Total Protein: 5.9 g/dL — ABNORMAL LOW (ref 6.0–8.3)

## 2021-09-02 LAB — LIPID PANEL
Cholesterol: 129 mg/dL (ref 0–200)
HDL: 40.9 mg/dL (ref >39.00)
LDL Cholesterol: 55 mg/dL (ref 0–99)
NonHDL: 87.67
Total CHOL/HDL Ratio: 3
Triglycerides: 163 mg/dL — ABNORMAL HIGH (ref 0.0–149.0)
VLDL: 32.6 mg/dL (ref 0.0–40.0)

## 2021-09-02 LAB — HEMOGLOBIN A1C: Hgb A1c MFr Bld: 5.8 % (ref 4.6–6.5)

## 2021-09-02 LAB — TSH: TSH: 5.1 u[IU]/mL (ref 0.35–5.50)

## 2021-09-02 LAB — VITAMIN B12: Vitamin B-12: >1550 pg/mL — ABNORMAL HIGH (ref 211–911)

## 2021-09-06 ENCOUNTER — Other Ambulatory Visit: Payer: Self-pay

## 2021-09-06 ENCOUNTER — Ambulatory Visit (INDEPENDENT_AMBULATORY_CARE_PROVIDER_SITE_OTHER): Payer: Medicare Other | Admitting: Family Medicine

## 2021-09-06 ENCOUNTER — Encounter: Payer: Self-pay | Admitting: Family Medicine

## 2021-09-06 ENCOUNTER — Ambulatory Visit (HOSPITAL_BASED_OUTPATIENT_CLINIC_OR_DEPARTMENT_OTHER)
Admission: RE | Admit: 2021-09-06 | Discharge: 2021-09-06 | Disposition: A | Discharge status: Home / Self Care | Payer: Medicare Other | Source: Ambulatory Visit | Attending: Family Medicine | Admitting: Family Medicine

## 2021-09-06 VITALS — BP 112/62 | HR 66 | Temp 97.7°F | Resp 16 | Wt 141.2 lb

## 2021-09-06 DIAGNOSIS — R739 Hyperglycemia, unspecified: Secondary | ICD-10-CM | POA: Diagnosis not present

## 2021-09-06 DIAGNOSIS — M79604 Pain in right leg: Secondary | ICD-10-CM

## 2021-09-06 DIAGNOSIS — M7989 Other specified soft tissue disorders: Secondary | ICD-10-CM | POA: Diagnosis not present

## 2021-09-06 DIAGNOSIS — N289 Disorder of kidney and ureter, unspecified: Secondary | ICD-10-CM

## 2021-09-06 DIAGNOSIS — M2550 Pain in unspecified joint: Secondary | ICD-10-CM

## 2021-09-06 DIAGNOSIS — M25521 Pain in right elbow: Secondary | ICD-10-CM | POA: Insufficient documentation

## 2021-09-06 DIAGNOSIS — J329 Chronic sinusitis, unspecified: Secondary | ICD-10-CM

## 2021-09-06 DIAGNOSIS — E538 Deficiency of other specified B group vitamins: Secondary | ICD-10-CM

## 2021-09-06 DIAGNOSIS — E785 Hyperlipidemia, unspecified: Secondary | ICD-10-CM

## 2021-09-06 LAB — VITAMIN D 1,25 DIHYDROXY
Vitamin D 1, 25 (OH)2 Total: 40 pg/mL (ref 18–72)
Vitamin D2 1, 25 (OH)2: <8 pg/mL
Vitamin D3 1, 25 (OH)2: 40 pg/mL

## 2021-09-06 LAB — ANA: Anti Nuclear Antibody (ANA): NEGATIVE

## 2021-09-06 LAB — RHEUMATOID FACTOR: Rheumatoid fact SerPl-aCnc: <14 IU/mL (ref <14)

## 2021-09-06 MED ORDER — SULFAMETHOXAZOLE-TRIMETHOPRIM 800-160 MG PO TABS: 1.0000 | ORAL_TABLET | Freq: Two times a day (BID) | ORAL | 0 refills | Status: DC

## 2021-09-06 MED ORDER — GABAPENTIN 800 MG PO TABS: 800.0000 mg | ORAL_TABLET | Freq: Three times a day (TID) | ORAL | 1 refills | Status: DC

## 2021-09-06 NOTE — Patient Instructions (Addendum)
CBD daily cream, at [employer] to see if that helps the pain   Chia, sesame seeds, collagen, hemp powder, whey powder, yellow split pea powder Eggs, dairy, tofu, nuts, beans and seeds  Multivitamin with minerals daily

## 2021-09-06 NOTE — Progress Notes (Signed)
Subjective:   By signing my name below, I, [name], attest that this documentation has been prepared under the direction and in the presence of [name] 09/06/2021   Patient ID: [name], female    DOB: [birth year], 75 y.o.   MRN: [id number]  Chief Complaint  Patient presents with   lump on extremities    And also headaches    HPI Patient is in today for office visit and other chronic medical concerns. She reports to have lumps in extremities. At both knees and both elbows. The most tender area is the medial aspect of the right elbow. She also notes a tender spot over her left bicep as well. No recent febrile illness or hospitalization but she is experiencing increased congestion and facial pain and imaging confirms a sinus infection.   Patient denies any blood in stool, chest pains, palpitations, shortness of breath, congestion, fever, of GI or GU complaints.  Past Medical History:  Diagnosis Date   Allergy    Anemia    Anxiety    past hx    Arthritis    Asthma    Atrial flutter ([place])    past history- not current   Breast cancer ([place])    CAD (coronary artery disease) CARDIOLOGIST--  DR Angelena Form   mild non-obstructive cad   Cancer ([place])    right   Cataract    bilaterally removed    Chronic constipation    Chronic kidney disease    interstitial cystitis   Concussion    x 3   COPD (chronic obstructive pulmonary disease) (HCC)    Depression    past hx    Family history of malignant hyperthermia    father had this   Fibromyalgia    Frequency of urination    GERD (gastroesophageal reflux disease)    H/O hiatal hernia    History of basal cell carcinoma excision    X2   History of breast cancer ONCOLOGIST-- DR Jana Hakim---  NO RECURRANCE   DX 07/2012;  LOW GRADE DCIS  ER+PR+  ----  S/P RIGHT LUMPECTOMY WITH NEGATIVE MARGINS/   RADIATION ENDED 11/2012   History of chronic bronchitis    History of colonic polyps    History of tachycardia    CONTROLLED   WITH ATENOLOL   Hyperlipidemia    Hypertension    Neuromuscular disorder (HCC)    fibromyalgia   Neuropathy    Osteoporosis 01/2019   T score -2.2 stable/improved from prior study   Pelvic pain    Personal history of radiation therapy    S/P radiation therapy 11/12/12 - 12/05/12   right Breast   Sepsis ([place]) 2014   from UTI    Sinus headache    Urgency of urination     Past Surgical History:  Procedure Laterality Date   52 HOUR [place] STUDY N/A 04/03/2016   Procedure: 24 HOUR [place] STUDY;  Surgeon: Manus Gunning, MD;  Location: [facility];  Service: Gastroenterology;  Laterality: N/A;   BREAST BIOPSY Right 08/23/2012   ADH   BREAST BIOPSY Right 10/11/2012   Ductal Carcinoma   BREAST EXCISIONAL BIOPSY Right 09/04/2013   benign   BREAST LUMPECTOMY Right 10-11-2012   W/ SLN BX   CARDIAC CATHETERIZATION  09-13-2007  DR Lia Foyer   WELL-PRESERVED LVF/  DIFFUSE SCATTERED CORONARY CALCIFACATION AND ATHEROSCLEROSIS WITHOUT OBSTRUCTION   CARDIAC CATHETERIZATION  08-04-2010  DR Angelena Form   NON-OBSTRUCTIVE CAD/  pLAD 40%/  oLAD 30%/  mLAD 30%/  pRCA 30%/  EF 60%   CARDIOVASCULAR STRESS TEST  06-18-2012  DR McALHANY   LOW RISK NUCLEAR STUDY/  SMALL FIXED AREA OF MODERATELY DECREASED UPTAKE IN ANTEROSEPTAL WALL WHICH MAY BE ARTIFACTUAL/  NO ISCHEMIA/  EF 68%   COLONOSCOPY  09-29-2010   CYSTOSCOPY     CYSTOSCOPY WITH HYDRODISTENSION AND BIOPSY N/A 03/06/2014   Procedure: CYSTOSCOPY/HYDRODISTENSION/ INSTILATION OF MARCAINE AND PYRIDIUM;  Surgeon: Ailene Rud, MD;  Location: [facility];  Service: Urology;  Laterality: N/A;   ESOPHAGEAL MANOMETRY N/A 04/03/2016   Procedure: ESOPHAGEAL MANOMETRY (EM);  Surgeon: Manus Gunning, MD;  Location: [facility];  Service: Gastroenterology;  Laterality: N/A;   EXTRACORPOREAL SHOCK WAVE LITHOTRIPSY Left 02/06/2019   Procedure: EXTRACORPOREAL SHOCK WAVE LITHOTRIPSY (ESWL);  Surgeon: Kathie Rhodes, MD;  Location: [facility];   Service: Urology;  Laterality: Left;   NASAL SINUS SURGERY  1985   ORIF RIGHT ANKLE  FX  2006   POLYPECTOMY     REMOVAL VOCAL CORD CYST  FEB 2014   RIGHT BREAST BX  08-23-2012   RIGHT HAND SURGERY  X3  LAST ONE 2009   INCLUDES  ORIF RIGHT 5TH FINGER AND REVISION TWICE   SKIN CANCER EXCISION     TONSILLECTOMY AND ADENOIDECTOMY  AGE 3   TOTAL ABDOMINAL HYSTERECTOMY W/ BILATERAL SALPINGOOPHORECTOMY  1982   W/  APPENDECTOMY   TRANSTHORACIC ECHOCARDIOGRAM  06-24-2012   GRADE I DIASTOLIC DYSFUNCTION/  EF 55-60%/  MILD MR   UPPER GASTROINTESTINAL ENDOSCOPY      Family History  Problem Relation Age of Onset   Rectal cancer Mother    Colon cancer Mother    Pancreatic cancer Mother    Diabetes Mother    Breast cancer Mother 60   Breast cancer Maternal Aunt        breast   Birth defects Maternal Aunt    Irritable bowel syndrome Son    Heart disease Son        CAD, MV replacement   Allergic Disorder Daughter    Diabetes Daughter    Colon cancer Father    Colon polyps Father    Diabetes Father    Stroke Father    Heart disease Father        CHF   Hyperlipidemia Father    Hypertension Father    Arthritis Father    Breast cancer Paternal Aunt    Breast cancer Paternal Aunt    Arthritis Paternal Uncle    Allergic Disorder Daughter    Heart disease Cousin        CAD, had a blood clot after stenting   Esophageal cancer Neg Hx    Stomach cancer Neg Hx     Social History   Socioeconomic History   Marital status: Married    Spouse name: [name]    Number of children: 3   Years of education: BA   Highest education level: Not on file  Occupational History   Occupation: Retired Programmer, multimedia: RETIRED  Tobacco Use   Smoking status: Former    Packs/day: 1.00    Years: 15.00    Pack years: 15.00    Types: Cigarettes    Quit date: 05/27/2005    Years since quitting: 16.2   Smokeless tobacco: Never  Vaping Use   Vaping Use: Never used  Substance and Sexual Activity    Alcohol use: No    Alcohol/week: 0.0 standard drinks   Drug use:  No   Sexual activity: Yes    Partners: Male    Comment: 1st intercourse 75 yo-Fewer than 5 partners  Other Topics Concern   Not on file  Social History Narrative   Lives with husband.   Caffeine use: 1/2 cup per day   Exercise-- 2days a week [employer],  Water aerobics, walking       Retired Marine scientist   No dietary restrictions, tries to maintain a heart healthy diet   Social Determinants of Radio broadcast assistant Strain: Not on file  Food Insecurity: Not on file  Transportation Needs: Not on file  Physical Activity: Not on file  Stress: Not on file  Social Connections: Not on file  Intimate Partner Violence: Not on file    Outpatient Medications Prior to Visit  Medication Sig Dispense Refill   acetaminophen (TYLENOL) 500 MG tablet Take 1,000 mg by mouth every 6 (six) hours as needed for mild pain or headache.      albuterol (VENTOLIN HFA) 108 (90 Base) MCG/ACT inhaler Inhale 2 puffs into the lungs every 4 (four) hours as needed. 1 each 0   amLODipine (NORVASC) 10 MG tablet Take 1 tablet (10 mg total) by mouth daily. 90 tablet 3   aspirin EC 81 MG tablet Take 1 tablet (81 mg total) by mouth daily. 90 tablet 3   atenolol (TENORMIN) 50 MG tablet Take 1 tablet (50 mg total) by mouth 2 (two) times daily. 180 tablet 1   Cyanocobalamin (VITAMIN B-12) 5000 MCG TBDP Take 1 tablet by mouth daily with breakfast.     denosumab (PROLIA) 60 MG/ML SOSY injection Inject 60 mg into the skin every 6 (six) months.     DULoxetine (CYMBALTA) 60 MG capsule Take 2 capsules (120 mg total) by mouth daily. 180 capsule 1   Estradiol 10 MCG TABS vaginal tablet Place 1 tablet (10 mcg total) vaginally 2 (two) times a week. 24 tablet 4   fluticasone (FLONASE) 50 MCG/ACT nasal spray Use 2 spray(s) in each nostril once daily 16 g 5   furosemide (LASIX) 20 MG tablet Take 1 tablet (20 mg total) by mouth as needed for edema. 90 tablet 3   hyoscyamine  (LEVSIN SL) 0.125 MG SL tablet Place 1 tablet (0.125 mg total) under the tongue every 4 (four) hours as needed. 30 tablet 0   losartan (COZAAR) 100 MG tablet Take 1 tablet by mouth once daily 90 tablet 3   mupirocin ointment (BACTROBAN) 2 % Apply thin film twice daily. 22 g 0   nitroGLYCERIN (NITROSTAT) 0.4 MG SL tablet Place 1 tablet (0.4 mg total) under the tongue every 5 (five) minutes as needed for chest pain. 25 tablet 1   nystatin cream (MYCOSTATIN) Apply 1 application topically 2 (two) times daily. 30 g 2   pantoprazole (PROTONIX) 40 MG tablet Take 1 tablet (40 mg total) by mouth daily. 90 tablet 1   pentosan polysulfate (ELMIRON) 100 MG capsule Take 1 capsule (100 mg total) by mouth 3 (three) times daily. Reported on 01/05/2016 (Patient taking differently: Take 100 mg by mouth 3 (three) times daily with meals as needed. Reported on 01/05/2016) 90 capsule 11   polyethylene glycol (MIRALAX) 17 g packet Take 17 g by mouth daily. 14 each 0   promethazine-dextromethorphan (PROMETHAZINE-DM) 6.25-15 MG/5ML syrup Take 5 mLs by mouth 3 (three) times daily as needed for cough. 240 mL 0   REPATHA SURECLICK 614 MG/ML SOAJ INJECT 140 MG SUBCUTANEOUSLY EVERY 14 DAYS 2 mL 11  sucralfate (CARAFATE) 1 GM/10ML suspension Take 10 mLs (1 g total) by mouth 4 (four) times daily -  with meals and at bedtime. 420 mL 1   traZODone (DESYREL) 100 MG tablet TAKE 1 TABLET BY MOUTH AT BEDTIME 90 tablet 0   gabapentin (NEURONTIN) 300 MG capsule Take 300 mg by mouth. 2cap in the morning, 2cap at noon, and 3caps at bedtime     pramipexole (MIRAPEX) 0.125 MG tablet Take 1-2 tablets (0.125-0.25 mg total) by mouth in the morning and at bedtime. 60 tablet 5   doxycycline (VIBRA-TABS) 100 MG tablet Take 1 tablet (100 mg total) by mouth 2 (two) times daily. 20 tablet 0   methylPREDNISolone (MEDROL) 4 MG tablet 5 tabs po x 1 day then 4 tabs po x 1 day then 3 tabs po x 1 day then 2 tabs po x 1 day then 1 tab po x 1 day and stop 15  tablet 0   No facility-administered medications prior to visit.    Allergies  Allergen Reactions   Clindamycin Rash and Shortness Of Breath   Clindamycin Hcl Shortness Of Breath and Rash   Penicillins Anaphylaxis   Rosuvastatin Anaphylaxis   Baclofen Other (See Comments) and Rash   Lincomycin Other (See Comments)   Clindamycin Hcl     Other reaction(s): Shortness of Breath, Rash   Codeine Hives and Other (See Comments)    headache Other reaction(s): Headache, Nausea, headache   Erythromycin Hives   Erythromycin Base Other (See Comments)    other   Fluzone [Influenza Virus Vaccine] Other (See Comments)    Local reaction at the site   Haemophilus Influenzae Other (See Comments)    Local reaction at the site Local reaction at the site   Haemophilus Influenzae Vaccines Other (See Comments)    Local reaction at site   Latex Hives   Other Other (See Comments)    Local reaction at site   Pentazocine Other (See Comments)   Pentazocine Lactate Other (See Comments)    HALLUCINATION   Pneumococcal Vaccine Hives and Swelling    Other reaction(s): Hives, Shortness of Breath   Pneumococcal Vaccine Polyvalent Hives, Swelling and Other (See Comments)    REACTION: redness, swelling, and hives at injection site   Tamoxifen Nausea And Vomiting and Other (See Comments)    HEADACHE Other reaction(s): Headache, Nausea    Review of Systems  Constitutional:  Negative for chills, fever and malaise/fatigue.  HENT:  Negative for congestion and sore throat.   Respiratory:  Negative for cough, shortness of breath and wheezing.   Cardiovascular:  Negative for chest pain, palpitations and leg swelling.  Gastrointestinal:  Negative for blood in stool, nausea and vomiting.  Genitourinary:  Negative for frequency and urgency.  Musculoskeletal:  Positive for joint pain and myalgias. Negative for falls.  Neurological:  Negative for dizziness, weakness and headaches.  Psychiatric/Behavioral:  Negative  for memory loss. The patient does not have insomnia.       Objective:    Physical Exam Constitutional:      General: She is not in acute distress.    Appearance: Normal appearance. She is not ill-appearing.  HENT:     Head: Normocephalic and atraumatic.     Right Ear: Tympanic membrane and external ear normal.     Left Ear: Tympanic membrane and external ear normal.     Nose: Nose normal.  Eyes:     Extraocular Movements: Extraocular movements intact.     Pupils: Pupils are  equal, round, and reactive to light.  Cardiovascular:     Rate and Rhythm: Normal rate and regular rhythm.     Pulses: Normal pulses.     Heart sounds: Normal heart sounds. No murmur heard. Pulmonary:     Effort: Pulmonary effort is normal.     Breath sounds: Normal breath sounds.  Abdominal:     General: Abdomen is flat.     Palpations: Abdomen is soft. There is no mass.  Musculoskeletal:        General: No swelling or tenderness. Normal range of motion.     Cervical back: Normal range of motion and neck supple.     Comments: Swelling below medial aspect of both knees and both elbows. More discrete at elbows  Skin:    General: Skin is warm and dry.  Neurological:     Mental Status: She is alert and oriented to person, place, and time.     Deep Tendon Reflexes: Reflexes normal.  Psychiatric:        Mood and Affect: Mood normal.        Behavior: Behavior normal.    BP 112/62   Pulse 66   Temp 97.7 F (36.5 C)   Resp 16   Wt 141 lb 3.2 oz (64 kg)   SpO2 97%   BMI 23.86 kg/m  Wt Readings from Last 3 Encounters:  09/06/21 141 lb 3.2 oz (64 kg)  08/18/21 139 lb (63 kg)  07/21/21 140 lb 3.2 oz (63.6 kg)    Diabetic Foot Exam - Simple   No data filed    Lab Results  Component Value Date   WBC 7.2 09/02/2021   HGB 11.6 (L) 09/02/2021   HCT 36.4 09/02/2021   PLT 268.0 09/02/2021   GLUCOSE 109 (H) 09/02/2021   CHOL 129 09/02/2021   TRIG 163.0 (H) 09/02/2021   HDL 40.90 09/02/2021    LDLDIRECT 117.3 04/08/2012   LDLCALC 55 09/02/2021   ALT 9 09/02/2021   AST 16 09/02/2021   NA 143 09/02/2021   K 3.9 09/02/2021   CL 104 09/02/2021   CREATININE 1.04 09/02/2021   BUN 11 09/02/2021   CO2 32 09/02/2021   TSH 5.10 09/02/2021   INR 1.05 08/03/2010   HGBA1C 5.8 09/02/2021   MICROALBUR 0.2 07/10/2013    Lab Results  Component Value Date   TSH 5.10 09/02/2021   Lab Results  Component Value Date   WBC 7.2 09/02/2021   HGB 11.6 (L) 09/02/2021   HCT 36.4 09/02/2021   MCV 93.8 09/02/2021   PLT 268.0 09/02/2021   Lab Results  Component Value Date   NA 143 09/02/2021   K 3.9 09/02/2021   CHLORIDE 102 04/20/2015   CO2 32 09/02/2021   GLUCOSE 109 (H) 09/02/2021   BUN 11 09/02/2021   CREATININE 1.04 09/02/2021   BILITOT 0.4 09/02/2021   ALKPHOS 85 09/02/2021   AST 16 09/02/2021   ALT 9 09/02/2021   PROT 5.9 (L) 09/02/2021   ALBUMIN 3.5 09/02/2021   CALCIUM 8.7 09/02/2021   ANIONGAP 12 (H) 04/20/2015   EGFR 49 (L) 04/20/2015   GFR 52.33 (L) 09/02/2021   Lab Results  Component Value Date   CHOL 129 09/02/2021   Lab Results  Component Value Date   HDL 40.90 09/02/2021   Lab Results  Component Value Date   LDLCALC 55 09/02/2021   Lab Results  Component Value Date   TRIG 163.0 (H) 09/02/2021   Lab Results  Component  Value Date   CHOLHDL 3 09/02/2021   Lab Results  Component Value Date   HGBA1C 5.8 09/02/2021       Assessment & Plan:   Problem List Items Addressed This Visit     Leg pain - Primary   Arthralgia    She has persistent pain over medial elbows and knees. No redness or warm but there is some swelling which is more circumscribed at elbows vs the knees. Tender to palpation. Will order xray and then ultrasound of right elbow to investigate.       Renal insufficiency    Hydrate and monitor      Hyperlipidemia    Encourage heart healthy diet such as MIND or DASH diet, increase exercise, avoid trans fats, simple carbohydrates  and processed foods, consider a krill or fish or flaxseed oil cap daily.       Recurrent sinusitis    Imaging from [facility] confirms sinusitis. Started on Bactrim DS bid      Relevant Medications   sulfamethoxazole-trimethoprim (BACTRIM DS) 800-160 MG tablet   Vitamin B12 deficiency    Supplement and monitor      Hyperglycemia    hgba1c acceptable, minimize simple carbs. Increase exercise as tolerated.       Other Visit Diagnoses     Right elbow pain       Relevant Orders   DG Elbow 2 Views Right       F/U in 1-2 months  Meds ordered this encounter  Medications   gabapentin (NEURONTIN) 800 MG tablet    Sig: Take 1 tablet (800 mg total) by mouth 3 (three) times daily.    Dispense:  270 tablet    Refill:  1   sulfamethoxazole-trimethoprim (BACTRIM DS) 800-160 MG tablet    Sig: Take 1 tablet by mouth 2 (two) times daily.    Dispense:  28 tablet    Refill:  0    I, Sheri Homans, MD, personally preformed the services described in this documentation.  All medical record entries made by the scribe were at my direction and in my presence.  I have reviewed the chart and discharge instructions (if applicable) and agree that the record reflects my personal performance and is accurate and complete. [name] 09/06/2021   I,Jada Bradford,acting as a scribe for Sheri Homans, MD.,have documented all relevant documentation on the behalf of Sheri Homans, MD,as directed by  Sheri Homans, MD while in the presence of Sheri Homans, MD.  I, Mosie Lukes, MD personally performed the services described in this documentation. All medical record entries made by the scribe were at my direction and in my presence. I have reviewed the chart and agree that the record reflects my personal performance and is accurate and complete       Sheri Homans, MD

## 2021-09-07 ENCOUNTER — Encounter: Payer: Self-pay | Admitting: Family Medicine

## 2021-09-07 NOTE — Assessment & Plan Note (Signed)
[id number] acceptable, minimize simple carbs. Increase exercise as tolerated.  

## 2021-09-07 NOTE — Assessment & Plan Note (Signed)
Imaging from [facility] confirms sinusitis. Started on Bactrim DS bid

## 2021-09-07 NOTE — Assessment & Plan Note (Signed)
Hydrate and monitor 

## 2021-09-07 NOTE — Assessment & Plan Note (Signed)
She has persistent pain over medial elbows and knees. No redness or warm but there is some swelling which is more circumscribed at elbows vs the knees. Tender to palpation. Will order xray and then ultrasound of right elbow to investigate.

## 2021-09-07 NOTE — Assessment & Plan Note (Signed)
Supplement and monitor 

## 2021-09-07 NOTE — Assessment & Plan Note (Signed)
Encourage heart healthy diet such as MIND or DASH diet, increase exercise, avoid trans fats, simple carbohydrates and processed foods, consider a krill or fish or flaxseed oil cap daily.  °

## 2021-09-08 ENCOUNTER — Other Ambulatory Visit: Payer: Self-pay | Admitting: Family Medicine

## 2021-09-08 DIAGNOSIS — M25521 Pain in right elbow: Secondary | ICD-10-CM

## 2021-09-08 DIAGNOSIS — M25421 Effusion, right elbow: Secondary | ICD-10-CM

## 2021-09-08 NOTE — Progress Notes (Unsigned)
c 

## 2021-09-12 ENCOUNTER — Ambulatory Visit (HOSPITAL_BASED_OUTPATIENT_CLINIC_OR_DEPARTMENT_OTHER): Payer: Medicare Other

## 2021-09-22 ENCOUNTER — Other Ambulatory Visit: Payer: Self-pay

## 2021-09-22 ENCOUNTER — Ambulatory Visit (HOSPITAL_BASED_OUTPATIENT_CLINIC_OR_DEPARTMENT_OTHER)
Admission: RE | Admit: 2021-09-22 | Discharge: 2021-09-22 | Disposition: A | Discharge status: Home / Self Care | Payer: Medicare Other | Source: Ambulatory Visit | Attending: Family Medicine | Admitting: Family Medicine

## 2021-09-22 DIAGNOSIS — G609 Hereditary and idiopathic neuropathy, unspecified: Secondary | ICD-10-CM | POA: Diagnosis not present

## 2021-09-22 DIAGNOSIS — M25521 Pain in right elbow: Secondary | ICD-10-CM | POA: Insufficient documentation

## 2021-09-22 DIAGNOSIS — M25421 Effusion, right elbow: Secondary | ICD-10-CM | POA: Insufficient documentation

## 2021-09-23 ENCOUNTER — Other Ambulatory Visit: Payer: Self-pay | Admitting: Family Medicine

## 2021-09-23 ENCOUNTER — Other Ambulatory Visit: Payer: Self-pay | Admitting: Cardiology

## 2021-09-23 DIAGNOSIS — I10 Essential (primary) hypertension: Secondary | ICD-10-CM

## 2021-09-27 ENCOUNTER — Telehealth: Payer: Self-pay

## 2021-09-27 DIAGNOSIS — M064 Inflammatory polyarthropathy: Secondary | ICD-10-CM | POA: Diagnosis not present

## 2021-09-27 DIAGNOSIS — G894 Chronic pain syndrome: Secondary | ICD-10-CM | POA: Diagnosis not present

## 2021-09-27 NOTE — Telephone Encounter (Signed)
I called the pt because she dropped at note at the front desk stating repatha in the donut whole need samples. I had no samples to give but I called and lmom pt that I would mail pt assistance forms

## 2021-10-19 ENCOUNTER — Ambulatory Visit: Payer: Medicare Other | Admitting: Cardiology

## 2021-10-19 ENCOUNTER — Encounter: Payer: Self-pay | Admitting: Cardiology

## 2021-10-19 ENCOUNTER — Other Ambulatory Visit: Payer: Self-pay

## 2021-10-19 VITALS — BP 120/62 | HR 68 | Ht 64.5 in | Wt 137.2 lb

## 2021-10-19 DIAGNOSIS — R0602 Shortness of breath: Secondary | ICD-10-CM

## 2021-10-19 DIAGNOSIS — E785 Hyperlipidemia, unspecified: Secondary | ICD-10-CM | POA: Diagnosis not present

## 2021-10-19 DIAGNOSIS — I10 Essential (primary) hypertension: Secondary | ICD-10-CM

## 2021-10-19 DIAGNOSIS — I2510 Atherosclerotic heart disease of native coronary artery without angina pectoris: Secondary | ICD-10-CM

## 2021-10-19 MED ORDER — FUROSEMIDE 20 MG PO TABS: 20.0000 mg | ORAL_TABLET | ORAL | 3 refills | Status: DC | PRN

## 2021-10-19 MED ORDER — LOSARTAN POTASSIUM 100 MG PO TABS: 100.0000 mg | ORAL_TABLET | Freq: Every day | ORAL | 0 refills | Status: DC

## 2021-10-19 NOTE — Progress Notes (Signed)
HPI: FU CAD. Cardiac catheterization in August 2011 showed a 40% proximal LAD, 30% mid LAD, 30% RCA and her ejection fraction was 60%. Nuclear study in June 2013 showed no ischemia with an ejection fraction of 68%. Nuclear study March 2016 showed ejection fraction 67%. Breast attenuation but no ischemia. MRA of the head October 2018 normal. Renal dopplers 1/19 showed no RAS. Echocardiogram repeated July 2020 and showed normal LV function. Since last seen she has some dyspnea on exertion.  No orthopnea, PND, exertional chest pain or syncope.  She has had occasional intermittent pedal edema.  It is bilateral.  Current Outpatient Medications  Medication Sig Dispense Refill   acetaminophen (TYLENOL) 500 MG tablet Take 1,000 mg by mouth every 6 (six) hours as needed for mild pain or headache.      albuterol (VENTOLIN HFA) 108 (90 Base) MCG/ACT inhaler Inhale 2 puffs into the lungs every 4 (four) hours as needed. 1 each 0   amLODipine (NORVASC) 10 MG tablet Take 1 tablet (10 mg total) by mouth daily. 90 tablet 3   aspirin EC 81 MG tablet Take 1 tablet (81 mg total) by mouth daily. 90 tablet 3   atenolol (TENORMIN) 50 MG tablet Take 1 tablet (50 mg total) by mouth 2 (two) times daily. 180 tablet 1   Cyanocobalamin (VITAMIN B-12) 5000 MCG TBDP Take 1 tablet by mouth daily with breakfast.     denosumab (PROLIA) 60 MG/ML SOSY injection Inject 60 mg into the skin every 6 (six) months.     DULoxetine (CYMBALTA) 60 MG capsule Take 2 capsules (120 mg total) by mouth daily. 180 capsule 1   Estradiol 10 MCG TABS vaginal tablet Place 1 tablet (10 mcg total) vaginally 2 (two) times a week. 24 tablet 4   fluticasone (FLONASE) 50 MCG/ACT nasal spray Use 2 spray(s) in each nostril once daily 16 g 5   furosemide (LASIX) 20 MG tablet Take 1 tablet (20 mg total) by mouth as needed for edema. 90 tablet 3   gabapentin (NEURONTIN) 800 MG tablet Take 1 tablet (800 mg total) by mouth 3 (three) times daily. 270 tablet 1    hyoscyamine (LEVSIN SL) 0.125 MG SL tablet Place 1 tablet (0.125 mg total) under the tongue every 4 (four) hours as needed. 30 tablet 0   losartan (COZAAR) 100 MG tablet Take 1 tablet by mouth once daily 60 tablet 0   mupirocin ointment (BACTROBAN) 2 % Apply thin film twice daily. 22 g 0   nitroGLYCERIN (NITROSTAT) 0.4 MG SL tablet Place 1 tablet (0.4 mg total) under the tongue every 5 (five) minutes as needed for chest pain. 25 tablet 1   nystatin cream (MYCOSTATIN) Apply 1 application topically 2 (two) times daily. 30 g 2   pantoprazole (PROTONIX) 40 MG tablet Take 1 tablet (40 mg total) by mouth daily. 90 tablet 1   pentosan polysulfate (ELMIRON) 100 MG capsule Take 1 capsule (100 mg total) by mouth 3 (three) times daily. Reported on 01/05/2016 (Patient taking differently: Take 100 mg by mouth 3 (three) times daily with meals as needed. Reported on 01/05/2016) 90 capsule 11   polyethylene glycol (MIRALAX) 17 g packet Take 17 g by mouth daily. 14 each 0   promethazine-dextromethorphan (PROMETHAZINE-DM) 6.25-15 MG/5ML syrup Take 5 mLs by mouth 3 (three) times daily as needed for cough. 240 mL 0   REPATHA SURECLICK 952 MG/ML SOAJ INJECT 140 MG SUBCUTANEOUSLY EVERY 14 DAYS 2 mL 11   sucralfate (CARAFATE)  1 GM/10ML suspension Take 10 mLs (1 g total) by mouth 4 (four) times daily -  with meals and at bedtime. 420 mL 1   sulfamethoxazole-trimethoprim (BACTRIM DS) 800-160 MG tablet Take 1 tablet by mouth 2 (two) times daily. 28 tablet 0   traZODone (DESYREL) 100 MG tablet TAKE 1 TABLET BY MOUTH AT BEDTIME 90 tablet 1   No current facility-administered medications for this visit.     Past Medical History:  Diagnosis Date   Allergy    Anemia    Anxiety    past hx    Arthritis    Asthma    Atrial flutter ([place])    past history- not current   Breast cancer ([place])    CAD (coronary artery disease) CARDIOLOGIST--  DR Angelena Form   mild non-obstructive cad   Cancer ([place])    right   Cataract     bilaterally removed    Chronic constipation    Chronic kidney disease    interstitial cystitis   Concussion    x 3   COPD (chronic obstructive pulmonary disease) (HCC)    Depression    past hx    Family history of malignant hyperthermia    father had this   Fibromyalgia    Frequency of urination    GERD (gastroesophageal reflux disease)    H/O hiatal hernia    History of basal cell carcinoma excision    X2   History of breast cancer ONCOLOGIST-- DR Jana Hakim---  NO RECURRANCE   DX 07/2012;  LOW GRADE DCIS  ER+PR+  ----  S/P RIGHT LUMPECTOMY WITH NEGATIVE MARGINS/   RADIATION ENDED 11/2012   History of chronic bronchitis    History of colonic polyps    History of tachycardia    CONTROLLED  WITH ATENOLOL   Hyperlipidemia    Hypertension    Neuromuscular disorder (HCC)    fibromyalgia   Neuropathy    Osteoporosis 01/2019   T score -2.2 stable/improved from prior study   Pelvic pain    Personal history of radiation therapy    S/P radiation therapy 11/12/12 - 12/05/12   right Breast   Sepsis ([place]) 2014   from UTI    Sinus headache    Urgency of urination     Past Surgical History:  Procedure Laterality Date   62 HOUR [place] STUDY N/A 04/03/2016   Procedure: 24 HOUR [place] STUDY;  Surgeon: Manus Gunning, MD;  Location: [facility];  Service: Gastroenterology;  Laterality: N/A;   BREAST BIOPSY Right 08/23/2012   ADH   BREAST BIOPSY Right 10/11/2012   Ductal Carcinoma   BREAST EXCISIONAL BIOPSY Right 09/04/2013   benign   BREAST LUMPECTOMY Right 10-11-2012   W/ SLN BX   CARDIAC CATHETERIZATION  09-13-2007  DR Lia Foyer   WELL-PRESERVED LVF/  DIFFUSE SCATTERED CORONARY CALCIFACATION AND ATHEROSCLEROSIS WITHOUT OBSTRUCTION   CARDIAC CATHETERIZATION  08-04-2010  DR MCALHANY   NON-OBSTRUCTIVE CAD/  pLAD 40%/  oLAD 30%/  mLAD 30%/  pRCA 30%/  EF 60%   CARDIOVASCULAR STRESS TEST  06-18-2012  DR McALHANY   LOW RISK NUCLEAR STUDY/  SMALL FIXED AREA OF MODERATELY DECREASED UPTAKE  IN ANTEROSEPTAL WALL WHICH MAY BE ARTIFACTUAL/  NO ISCHEMIA/  EF 68%   COLONOSCOPY  09-29-2010   CYSTOSCOPY     CYSTOSCOPY WITH HYDRODISTENSION AND BIOPSY N/A 03/06/2014   Procedure: CYSTOSCOPY/HYDRODISTENSION/ INSTILATION OF MARCAINE AND PYRIDIUM;  Surgeon: Ailene Rud, MD;  Location: [facility];  Service: Urology;  Laterality: N/A;   ESOPHAGEAL MANOMETRY N/A 04/03/2016   Procedure: ESOPHAGEAL MANOMETRY (EM);  Surgeon: Manus Gunning, MD;  Location: [facility];  Service: Gastroenterology;  Laterality: N/A;   EXTRACORPOREAL SHOCK WAVE LITHOTRIPSY Left 02/06/2019   Procedure: EXTRACORPOREAL SHOCK WAVE LITHOTRIPSY (ESWL);  Surgeon: Kathie Rhodes, MD;  Location: [facility];  Service: Urology;  Laterality: Left;   NASAL SINUS SURGERY  1985   ORIF RIGHT ANKLE  FX  2006   POLYPECTOMY     REMOVAL VOCAL CORD CYST  FEB 2014   RIGHT BREAST BX  08-23-2012   RIGHT HAND SURGERY  X3  LAST ONE 2009   INCLUDES  ORIF RIGHT 5TH FINGER AND REVISION TWICE   SKIN CANCER EXCISION     TONSILLECTOMY AND ADENOIDECTOMY  AGE 75   TOTAL ABDOMINAL HYSTERECTOMY W/ BILATERAL SALPINGOOPHORECTOMY  1982   W/  APPENDECTOMY   TRANSTHORACIC ECHOCARDIOGRAM  06-24-2012   GRADE I DIASTOLIC DYSFUNCTION/  EF 55-60%/  MILD MR   UPPER GASTROINTESTINAL ENDOSCOPY      Social History   Socioeconomic History   Marital status: Married    Spouse name: Jori Moll    Number of children: 3   Years of education: BA   Highest education level: Not on file  Occupational History   Occupation: Retired Programmer, multimedia: RETIRED  Tobacco Use   Smoking status: Former    Packs/day: 1.00    Years: 15.00    Pack years: 15.00    Types: Cigarettes    Quit date: 05/27/2005    Years since quitting: 16.4   Smokeless tobacco: Never  Vaping Use   Vaping Use: Never used  Substance and Sexual Activity   Alcohol use: No    Alcohol/week: 0.0 standard drinks   Drug use: No   Sexual activity: Yes    Partners: Male     Comment: 1st intercourse 75 yo-Fewer than 5 partners  Other Topics Concern   Not on file  Social History Narrative   Lives with husband.   Caffeine use: 1/2 cup per day   Exercise-- 2days a week [employer],  Water aerobics, walking       Retired Marine scientist   No dietary restrictions, tries to maintain a heart healthy diet   Social Determinants of Radio broadcast assistant Strain: Not on file  Food Insecurity: Not on file  Transportation Needs: Not on file  Physical Activity: Not on file  Stress: Not on file  Social Connections: Not on file  Intimate Partner Violence: Not on file    Family History  Problem Relation Age of Onset   Rectal cancer Mother    Colon cancer Mother    Pancreatic cancer Mother    Diabetes Mother    Breast cancer Mother 42   Breast cancer Maternal Aunt        breast   Birth defects Maternal Aunt    Irritable bowel syndrome Son    Heart disease Son        CAD, MV replacement   Allergic Disorder Daughter    Diabetes Daughter    Colon cancer Father    Colon polyps Father    Diabetes Father    Stroke Father    Heart disease Father        CHF   Hyperlipidemia Father    Hypertension Father    Arthritis Father    Breast cancer Paternal Aunt    Breast cancer Paternal Aunt    Arthritis Paternal Uncle  Allergic Disorder Daughter    Heart disease Cousin        CAD, had a blood clot after stenting   Esophageal cancer Neg Hx    Stomach cancer Neg Hx     ROS: no fevers or chills, productive cough, hemoptysis, dysphasia, odynophagia, melena, hematochezia, dysuria, hematuria, rash, seizure activity, orthopnea, PND, pedal edema, claudication. Remaining systems are negative.  Physical Exam: Well-developed well-nourished in no acute distress.  Skin is warm and dry.  HEENT is normal.  Neck is supple.  Chest is clear to auscultation with normal expansion.  Cardiovascular exam is regular rate and rhythm.  Abdominal exam nontender or distended. No masses  palpated. Extremities show no edema. neuro grossly intact  ECG-normal sinus rhythm at a rate of 68, nonspecific ST changes.  Personally reviewed  A/P  1 coronary artery disease-patient doing well from a symptomatic standpoint with no chest pain.  Continue aspirin.  She is intolerant to statins.  2 hyperlipidemia-continue Repatha.  She is intolerant to statins.  3 hypertension-patient's blood pressure is controlled.  Continue present medical regimen.  4 dyspnea/lower extremity edema-not significantly volume overloaded on examination.  We will repeat echocardiogram to reassess LV function.  She will take Lasix as needed.  Also asked her to keep her feet elevated in the evenings.  Kirk Ruths, MD

## 2021-10-19 NOTE — Patient Instructions (Signed)
  Testing/Procedures:  Your physician has requested that you have an echocardiogram. Echocardiography is a painless test that uses sound waves to create images of your heart. It provides your doctor with information about the size and shape of your heart and how well your heart's chambers and valves are working. This procedure takes approximately one hour. There are no restrictions for this procedure.[address]     Follow-Up: At [facility], you and your health needs are our priority.  As part of our continuing mission to provide you with exceptional heart care, we have created designated Provider Care Teams.  These Care Teams include your primary Cardiologist (physician) and Advanced Practice Providers (APPs -  Physician Assistants and Nurse Practitioners) who all work together to provide you with the care you need, when you need it.  We recommend signing up for the patient portal called "MyChart".  Sign up information is provided on this After Visit Summary.  MyChart is used to connect with patients for Virtual Visits (Telemedicine).  Patients are able to view lab/test results, encounter notes, upcoming appointments, etc.  Non-urgent messages can be sent to your provider as well.   To learn more about what you can do with MyChart, go to [email or web address].    Your next appointment:   12 month(s)  The format for your next appointment:   In Person  Provider:   Brian Crenshaw, MD    

## 2021-11-07 ENCOUNTER — Telehealth: Payer: Self-pay

## 2021-11-07 NOTE — Telephone Encounter (Signed)
Patient's follow up for 11/10/21 is overbooked. Called to offer patient appt today at 4 pm with Dr. Havery Moros, or 11/08/21 at 10:10 am. I left detailed vm with this information. I asked that patient give me a call back as soon as she can.

## 2021-11-07 NOTE — Telephone Encounter (Signed)
Schedule has been adjusted, another patient was able to switch their appt time. No need to change appt.

## 2021-11-10 ENCOUNTER — Encounter: Payer: Self-pay | Admitting: Gastroenterology

## 2021-11-10 ENCOUNTER — Ambulatory Visit: Payer: Medicare Other | Admitting: Gastroenterology

## 2021-11-10 VITALS — BP 113/68 | HR 89 | Ht 64.0 in | Wt 134.0 lb

## 2021-11-10 DIAGNOSIS — R1310 Dysphagia, unspecified: Secondary | ICD-10-CM

## 2021-11-10 DIAGNOSIS — K219 Gastro-esophageal reflux disease without esophagitis: Secondary | ICD-10-CM | POA: Diagnosis not present

## 2021-11-10 DIAGNOSIS — R194 Change in bowel habit: Secondary | ICD-10-CM

## 2021-11-10 DIAGNOSIS — Z79899 Other long term (current) drug therapy: Secondary | ICD-10-CM

## 2021-11-10 DIAGNOSIS — R1030 Lower abdominal pain, unspecified: Secondary | ICD-10-CM

## 2021-11-10 DIAGNOSIS — Z86010 Personal history of colonic polyps: Secondary | ICD-10-CM

## 2021-11-10 DIAGNOSIS — K625 Hemorrhage of anus and rectum: Secondary | ICD-10-CM

## 2021-11-10 MED ORDER — SUTAB 1479-225-188 MG PO TABS: 1.0000 | ORAL_TABLET | Freq: Once | ORAL | 0 refills | Status: AC

## 2021-11-10 MED ORDER — CITRUCEL PO POWD: ORAL | Status: DC

## 2021-11-10 NOTE — Patient Instructions (Addendum)
If you are age 76 or older, your body mass index should be between 23-30. Your Body mass index is 23 kg/m. If this is out of the aforementioned range listed, please consider follow up with your Primary Care Provider.  If you are age 70 or younger, your body mass index should be between 19-25. Your Body mass index is 23 kg/m. If this is out of the aformentioned range listed, please consider follow up with your Primary Care Provider.   ________________________________________________________  The [facility] providers would like to encourage you to use [facility] to communicate with providers for non-urgent requests or questions.  Due to long hold times on the telephone, sending your provider a message by [facility] may be a faster and more efficient way to get a response.  Please allow 48 business hours for a response.  Please remember that this is for non-urgent requests.  _______________________________________________________   Dennis Bast have been scheduled for an endoscopy and colonoscopy on Thursday, 12-29-21. Please follow the written instructions given to you at your visit today. Please pick up your prep supplies at the pharmacy within the next 1-3 days. If you use inhalers (even only as needed), please bring them with you on the day of your procedure Please purchase the following medications over the counter and take as directed: Citrucel: Take as directed once daily.  If this is not effective, use Miralax once daily and titrate as needed  Make sure your bowels are moving nicely the week before you start prepping for your procedure and increase your Miralax dosing as needed.  Please purchase the following medications over the counter and take as directed:  Citrucel: Take as directed  If this is not effective, use Miralax once to twice daily as needed   Thank you for entrusting me with your care and for choosing [facility], Dr. [place] Cellar

## 2021-11-10 NOTE — Progress Notes (Signed)
HPI :  75 year old female, here for a follow-up visit for altered bowel habits, GERD, dysphagia   See prior clinic notes for details of her case.  Historically she has had some intermittent loose stools, has taken Bentyl in the past as needed.  Has had some constipation at times as well.  At the last time I saw her she had more looser stools and had cut out dairy and gluten.  Tested negative for celiac disease in the past.  She endorses some rectal bleeding since have last seen her.  Happens about once per week, mostly blood just noted on the toilet paper itself however has seen some slight blood in the stool as well.  She states this often will happen if she has not had a bowel movement for few days and is on the constipated side.  She has alternating bowel habits from constipation and she will have a bowel movement for 2 to 3 days, and then she will have which he thinks is a buildup of stool and a "explosion" with loose stool.  She has been using MiraLAX as needed recently.  Has not tried fiber supplementation recently.  Her last colonoscopy was in December 2019, she had 4 adenomas/sessile serrated polyps removed at that time.  We discussed if she wanted to have any future surveillance colonoscopy.  Her mother had rectal cancer at age 12.  She does have some lower abdominal discomfort, she states it is rather stable over time and having a bowel movement can make this somewhat better for about a day or so until it recurs.  She has some baseline tenderness to the area.  She had a CT scan last year for abdominal pain which did not show a clear cause.  She otherwise has a history of dysphagia due to mild stenosis just inferior to the UES that has been noted on prior endoscopies and dilated with success.  I dilated her last in May of this year to 17 mm.  She had an appropriate mucosal rent in her upper esophagus.  She states it completely resolved all of her swallowing problems for about 5 months, however it  since has slowly come back.  She is having dysphagia fairly frequently with solids and rarely liquids at times as well.  She tries to maneuver her head and neck in different positions to help get food down.  She inquires about management of that.  She takes Protonix for GERD, 40 mg once daily.  She had mild esophagitis on her last EGD, had tried Pepcid but did not work as well.  She does have a history of osteopenia/osteoporosis.  Otherwise has been feeling okay.  On review of systems she does endorse some dyspnea on exertion and shortness of breath when climbing stairs and rare chest pain.  She has been seen by cardiologist and has a plan to have an echocardiogram tomorrow.  That is currently pending.   Prior workup:  Abdominal/Pelvic CT with contrast 08/06/2020: Lower chest: Normal heart size. Lung bases are clear. No pleural effusion.  Hepatobiliary: Liver is normal in size and contour. Subcentimeter too small to characterize low-attenuation lesions within the hepatic dome and left hepatic lobe. Gallbladder is unremarkable. No intrahepatic or extrahepatic biliary ductal dilatation.  Pancreas: Unremarkable Spleen: Unremarkable  Adrenals/Urinary Tract: Normal adrenal glands. Kidneys enhance symmetrically with contrast. No hydronephrosis. Urinary bladder is unremarkable.  Stomach/Bowel: Small hiatal hernia. Normal morphology of the stomach. No evidence for bowel obstruction. No free fluid or free intraperitoneal  air.  Vascular/Lymphatic: Normal caliber abdominal aorta. Peripheral calcified atherosclerotic plaque. No retroperitoneal lymphadenopathy.  Reproductive: Prior hysterectomy.  Musculoskeletal: Lower thoracic and lumbar spine degenerative changes. No aggressive or acute appearing osseous lesions.     EGD 12/24/2018:  - Esophagogastric landmarks identified. - Normal esophagus - empiric dilation performed to 76mm with small mucosal wrent at the UES - Erythematous mucosa in the  gastric body. - Normal duodenal bulb and second portion of the duodenum. - Biopsies were taken with a cold forceps for Helicobacter pylori testing.   Colonoscopy 12/24/2018: - One 4 mm polyp in the ascending colon, removed with a cold snare. Resected and retrieved. - One 4 mm polyp in the transverse colon, removed with a cold snare. Resected and retrieved. - One 4 mm polyp in the descending colon, removed with a cold snare. Resected and retrieved. - One 3 mm polyp in the sigmoid colon, removed with a cold snare. Resected and retrieved. - Anal papilla(e) were hypertrophied. Biopsied. - Tortuous colon, which prohibited ileal intubation. - Internal hemorrhoids. - Mostly adequate prep, fair in the cecum. - The examination was otherwise normal. No evidence of IBD or fistula. Suspect rectal bleeding is due to hemorrhoids in the setting of constipation.   1. Surgical [P], gastric antrum and gastric body - MILD REACTIVE GASTROPATHY. - NEGATIVE FOR HELICOBACTER PYLORI. - NO INTESTINAL METAPLASIA, DYSPLASIA, OR MALIGNANCY. 2. Surgical [P], transverse, ascending, sigmoid, polyp (2) - TUBULAR ADENOMA. - SESSILE SERRATED POLYP WITHOUT DYSPLASIA (X2 FRAGMENTS). - NO HIGH GRADE DYSPLASIA OR MALIGNANCY. 3. Surgical [P], anal papilla, polyp - POLYPOID FRAGMENT OF BENIGN SQUAMOUS MUCOSA. - NO DYSPLASIA OR MALIGNANCY.    EGD 04/26/21 -  - A 2 cm hiatal hernia was present. - LA Grade A esophagitis was found 36 cm from the incisors. - The exam of the esophagus was otherwise normal. - A guidewire was placed and the scope was withdrawn. Dilation was performed in the entire esophagus with a Savary dilator with mild resistance at 17 mm. Relook endoscopy showed an appropriate mucosal wrent just inferior to the UES. - The entire examined stomach was normal. - The duodenal bulb and second portion of the duodenum were normal.   Past Medical History:  Diagnosis Date   Allergy    Anemia    Anxiety     past hx    Arthritis    Asthma    Atrial flutter ([place])    past history- not current   Breast cancer (HCC)    CAD (coronary artery disease) CARDIOLOGIST--  DR Angelena Form   mild non-obstructive cad   Cancer ([place])    right   Cataract    bilaterally removed    Chronic constipation    Chronic kidney disease    interstitial cystitis   Concussion    x 3   COPD (chronic obstructive pulmonary disease) (HCC)    Depression    past hx    Family history of malignant hyperthermia    father had this   Fibromyalgia    Frequency of urination    GERD (gastroesophageal reflux disease)    H/O hiatal hernia    History of basal cell carcinoma excision    X2   History of breast cancer ONCOLOGIST-- DR Jana Hakim---  NO RECURRANCE   DX 07/2012;  LOW GRADE DCIS  ER+PR+  ----  S/P RIGHT LUMPECTOMY WITH NEGATIVE MARGINS/   RADIATION ENDED 11/2012   History of chronic bronchitis    History of colonic polyps  History of tachycardia    CONTROLLED  WITH ATENOLOL   Hyperlipidemia    Hypertension    Neuromuscular disorder (HCC)    fibromyalgia   Neuropathy    Osteoporosis 01/2019   T score -2.2 stable/improved from prior study   Pelvic pain    Personal history of radiation therapy    S/P radiation therapy 11/12/12 - 12/05/12   right Breast   Sepsis ([place]) 2014   from UTI    Sinus headache    Urgency of urination      Past Surgical History:  Procedure Laterality Date   83 HOUR [place] STUDY N/A 04/03/2016   Procedure: 24 HOUR PH STUDY;  Surgeon: Manus Gunning, MD;  Location: [facility];  Service: Gastroenterology;  Laterality: N/A;   BREAST BIOPSY Right 08/23/2012   ADH   BREAST BIOPSY Right 10/11/2012   Ductal Carcinoma   BREAST EXCISIONAL BIOPSY Right 09/04/2013   benign   BREAST LUMPECTOMY Right 10-11-2012   W/ [facility] BX   CARDIAC CATHETERIZATION  09-13-2007  DR Lia Foyer   WELL-PRESERVED LVF/  DIFFUSE SCATTERED CORONARY CALCIFACATION AND ATHEROSCLEROSIS WITHOUT OBSTRUCTION   CARDIAC  CATHETERIZATION  08-04-2010  DR MCALHANY   NON-OBSTRUCTIVE CAD/  pLAD 40%/  oLAD 30%/  mLAD 30%/  pRCA 30%/  EF 60%   CARDIOVASCULAR STRESS TEST  06-18-2012  DR McALHANY   LOW RISK NUCLEAR STUDY/  SMALL FIXED AREA OF MODERATELY DECREASED UPTAKE IN ANTEROSEPTAL WALL WHICH MAY BE ARTIFACTUAL/  NO ISCHEMIA/  EF 68%   COLONOSCOPY  09-29-2010   CYSTOSCOPY     CYSTOSCOPY WITH HYDRODISTENSION AND BIOPSY N/A 03/06/2014   Procedure: CYSTOSCOPY/HYDRODISTENSION/ INSTILATION OF MARCAINE AND PYRIDIUM;  Surgeon: Ailene Rud, MD;  Location: [facility];  Service: Urology;  Laterality: N/A;   ESOPHAGEAL MANOMETRY N/A 04/03/2016   Procedure: ESOPHAGEAL MANOMETRY (EM);  Surgeon: Manus Gunning, MD;  Location: [facility];  Service: Gastroenterology;  Laterality: N/A;   EXTRACORPOREAL SHOCK WAVE LITHOTRIPSY Left 02/06/2019   Procedure: EXTRACORPOREAL SHOCK WAVE LITHOTRIPSY (ESWL);  Surgeon: Kathie Rhodes, MD;  Location: [facility];  Service: Urology;  Laterality: Left;   NASAL SINUS SURGERY  1985   ORIF RIGHT ANKLE  FX  2006   POLYPECTOMY     REMOVAL VOCAL CORD CYST  FEB 2014   RIGHT BREAST BX  08-23-2012   RIGHT HAND SURGERY  X3  LAST ONE 2009   INCLUDES  ORIF RIGHT 5TH FINGER AND REVISION TWICE   SKIN CANCER EXCISION     TONSILLECTOMY AND ADENOIDECTOMY  AGE 2   TOTAL ABDOMINAL HYSTERECTOMY W/ BILATERAL SALPINGOOPHORECTOMY  1982   W/  APPENDECTOMY   TRANSTHORACIC ECHOCARDIOGRAM  06-24-2012   GRADE I DIASTOLIC DYSFUNCTION/  EF 55-60%/  MILD MR   UPPER GASTROINTESTINAL ENDOSCOPY     Family History  Problem Relation Age of Onset   Rectal cancer Mother    Colon cancer Mother    Pancreatic cancer Mother    Diabetes Mother    Breast cancer Mother 64   Breast cancer Maternal Aunt        breast   Birth defects Maternal Aunt    Irritable bowel syndrome Son    Heart disease Son        CAD, MV replacement   Allergic Disorder Daughter    Diabetes Daughter    Colon cancer Father     Colon polyps Father    Diabetes Father    Stroke Father    Heart disease Father  CHF   Hyperlipidemia Father    Hypertension Father    Arthritis Father    Breast cancer Paternal Aunt    Breast cancer Paternal Aunt    Arthritis Paternal Uncle    Allergic Disorder Daughter    Heart disease Cousin        CAD, had a blood clot after stenting   Esophageal cancer Neg Hx    Stomach cancer Neg Hx    Social History   Tobacco Use   Smoking status: Former    Packs/day: 1.00    Years: 15.00    Pack years: 15.00    Types: Cigarettes    Quit date: 05/27/2005    Years since quitting: 16.4   Smokeless tobacco: Never  Vaping Use   Vaping Use: Never used  Substance Use Topics   Alcohol use: No    Alcohol/week: 0.0 standard drinks   Drug use: No   Current Outpatient Medications  Medication Sig Dispense Refill   acetaminophen (TYLENOL) 500 MG tablet Take 1,000 mg by mouth every 6 (six) hours as needed for mild pain or headache.      albuterol (VENTOLIN HFA) 108 (90 Base) MCG/ACT inhaler Inhale 2 puffs into the lungs every 4 (four) hours as needed. 1 each 0   amLODipine (NORVASC) 10 MG tablet Take 1 tablet (10 mg total) by mouth daily. 90 tablet 3   aspirin EC 81 MG tablet Take 1 tablet (81 mg total) by mouth daily. 90 tablet 3   atenolol (TENORMIN) 50 MG tablet Take 1 tablet (50 mg total) by mouth 2 (two) times daily. 180 tablet 1   Cyanocobalamin (VITAMIN B-12) 5000 MCG TBDP Take 1 tablet by mouth daily with breakfast.     denosumab (PROLIA) 60 MG/ML SOSY injection Inject 60 mg into the skin every 6 (six) months.     DULoxetine (CYMBALTA) 60 MG capsule Take 2 capsules (120 mg total) by mouth daily. 180 capsule 1   Estradiol 10 MCG TABS vaginal tablet Place 1 tablet (10 mcg total) vaginally 2 (two) times a week. 24 tablet 4   fluticasone (FLONASE) 50 MCG/ACT nasal spray Use 2 spray(s) in each nostril once daily 16 g 5   furosemide (LASIX) 20 MG tablet Take 1 tablet (20 mg total) by  mouth as needed for edema. 90 tablet 3   gabapentin (NEURONTIN) 800 MG tablet Take 1 tablet (800 mg total) by mouth 3 (three) times daily. 270 tablet 1   hyoscyamine (LEVSIN SL) 0.125 MG SL tablet Place 1 tablet (0.125 mg total) under the tongue every 4 (four) hours as needed. 30 tablet 0   losartan (COZAAR) 100 MG tablet Take 1 tablet (100 mg total) by mouth daily. 60 tablet 0   mupirocin ointment (BACTROBAN) 2 % Apply thin film twice daily. 22 g 0   nitroGLYCERIN (NITROSTAT) 0.4 MG SL tablet Place 1 tablet (0.4 mg total) under the tongue every 5 (five) minutes as needed for chest pain. 25 tablet 1   nystatin cream (MYCOSTATIN) Apply 1 application topically 2 (two) times daily. 30 g 2   pantoprazole (PROTONIX) 40 MG tablet Take 1 tablet (40 mg total) by mouth daily. 90 tablet 1   pentosan polysulfate (ELMIRON) 100 MG capsule Take 1 capsule (100 mg total) by mouth 3 (three) times daily. Reported on 01/05/2016 (Patient taking differently: Take 100 mg by mouth 3 (three) times daily with meals as needed. Reported on 01/05/2016) 90 capsule 11   polyethylene glycol (MIRALAX) 17 g packet  Take 17 g by mouth daily. 14 each 0   REPATHA SURECLICK 209 MG/ML SOAJ INJECT 140 MG SUBCUTANEOUSLY EVERY 14 DAYS 2 mL 11   sucralfate (CARAFATE) 1 GM/10ML suspension Take 10 mLs (1 g total) by mouth 4 (four) times daily -  with meals and at bedtime. 420 mL 1   traZODone (DESYREL) 100 MG tablet TAKE 1 TABLET BY MOUTH AT BEDTIME 90 tablet 1   No current facility-administered medications for this visit.   Allergies  Allergen Reactions   Clindamycin Rash and Shortness Of Breath   Clindamycin Hcl Shortness Of Breath and Rash   Penicillins Anaphylaxis   Rosuvastatin Anaphylaxis   Baclofen Other (See Comments) and Rash   Lincomycin Other (See Comments)   Clindamycin Hcl     Other reaction(s): Shortness of Breath, Rash   Codeine Hives and Other (See Comments)    headache Other reaction(s): Headache, Nausea, headache    Erythromycin Hives   Erythromycin Base Other (See Comments)    other   Fluzone [Influenza Virus Vaccine] Other (See Comments)    Local reaction at the site   Haemophilus Influenzae Other (See Comments)    Local reaction at the site Local reaction at the site   Haemophilus Influenzae Vaccines Other (See Comments)    Local reaction at site   Latex Hives   Other Other (See Comments)    Local reaction at site   Pentazocine Other (See Comments)   Pentazocine Lactate Other (See Comments)    HALLUCINATION   Pneumococcal Vaccine Hives and Swelling    Other reaction(s): Hives, Shortness of Breath   Pneumococcal Vaccine Polyvalent Hives, Swelling and Other (See Comments)    REACTION: redness, swelling, and hives at injection site   Tamoxifen Nausea And Vomiting and Other (See Comments)    HEADACHE Other reaction(s): Headache, Nausea     Review of Systems: All systems reviewed and negative except where noted in HPI.   Lab Results  Component Value Date   WBC 7.2 09/02/2021   HGB 11.6 (L) 09/02/2021   HCT 36.4 09/02/2021   MCV 93.8 09/02/2021   PLT 268.0 09/02/2021    Lab Results  Component Value Date   CREATININE 1.04 09/02/2021   BUN 11 09/02/2021   NA 143 09/02/2021   K 3.9 09/02/2021   CL 104 09/02/2021   CO2 32 09/02/2021    Lab Results  Component Value Date   ALT 9 09/02/2021   AST 16 09/02/2021   ALKPHOS 85 09/02/2021   BILITOT 0.4 09/02/2021     Physical Exam: BP 113/68   Pulse 89   Ht 5\' 4"  (1.626 m)   Wt 134 lb (60.8 kg)   SpO2 92%   BMI 23.00 kg/m  Constitutional: Pleasant,well-developed, female in no acute distress. Abdominal: Soft, nondistended, some mild lower abdominal TTP, no rebound or guarding. There are no masses palpable.  Extremities: no edema Neurological: Alert and oriented to person place and time. Skin: Skin is warm and dry. No rashes noted. Psychiatric: Normal mood and affect. Behavior is normal.   ASSESSMENT AND PLAN: 75 year old  female here for reassessment following:  Dysphagia GERD Long term use of PPI Rectal bleeding Altered bowel habits History of colon polyps Lower abdominal pain  We discussed all of these issues as above.  She has a subtle stenosis just inferior to the UES causing her dysphagia.  This has been dilated twice with immediate relief of her symptoms.  Unfortunately the last exam in May did not  provide long-lasting relief, symptoms occurred after about 5 months or so.  She is having what she states is significant dysphagia.  I offered her another EGD with dilation to see if we can open this up again as it has reliably relieved her symptoms.  We discussed risks and benefits and she wants to proceed.  Of note she is having an echocardiogram tomorrow for some exertional dyspnea and we discussed that she needs to complete her cardiac work-up prior to proceeding with an EGD.  We will get her on the schedule however and if her cardiac work-up is still ongoing that will need to be completed prior to her endoscopy.  We discussed her reflux in general.  Protonix appears to be controlling this fairly well at 40 mg once daily however she is a bit leery about trying a lower dose as she has had problems with reflux in the past, failed Pepcid.  We discussed long-term risks and benefits, she does have a history of osteopenia, understands increased risk for fracture but wishes to continue present dosing of Protonix.  Otherwise she is having some intermittent rectal bleeding which I suspect is due to internal hemorrhoids based on her prior work-up, she had this at the time of her last colonoscopy.  She is having some ongoing altered bowel habits with constipation and then loose stools.  Query whether she has constipation with overflow, we discussed using MiraLAX more frequently although she is weary of trying this that she has had diarrhea in the past.  We discussed trying to use Citrucel once daily as this will try to provide  some regularity, if this is not working she can use MiraLAX as needed as well.  We discussed if she wished to have any further colonoscopy exams in light of her age as she is due for surveillance in light of her polyp history.  She does have a family history of colon cancer in light of her polyp and she wants to do 1 more exam prior to stopping screening.  This will be coordinated at the same time as her endoscopy.  We will confirm her bleeding source at this exam and provided reassurance.  We will see if the Citrucel helps her abdominal discomfort if her bowels become more regular.  Reassured her of her CT scan done last year  Plan: - Echocardiogram tomorrow  - EGD and colonoscopy - once cardiac workup completed - start Citrucel once daily, and if no help would switch to use Miralax daily and titrate up as needed - continue protonix at present dose, discussed long term risks  Jolly Mango, MD [facility]

## 2021-11-11 ENCOUNTER — Ambulatory Visit (HOSPITAL_COMMUNITY): Payer: Medicare Other | Attending: Internal Medicine

## 2021-11-11 ENCOUNTER — Other Ambulatory Visit: Payer: Self-pay

## 2021-11-11 DIAGNOSIS — R0602 Shortness of breath: Secondary | ICD-10-CM | POA: Diagnosis not present

## 2021-11-11 LAB — ECHOCARDIOGRAM COMPLETE
Area-P 1/2: 3.42 cm2
S' Lateral: 3.1 cm

## 2021-11-15 ENCOUNTER — Other Ambulatory Visit: Payer: Self-pay | Admitting: Family Medicine

## 2021-11-21 DIAGNOSIS — M2550 Pain in unspecified joint: Secondary | ICD-10-CM | POA: Diagnosis not present

## 2021-11-21 DIAGNOSIS — M1990 Unspecified osteoarthritis, unspecified site: Secondary | ICD-10-CM | POA: Diagnosis not present

## 2021-11-21 DIAGNOSIS — M79642 Pain in left hand: Secondary | ICD-10-CM | POA: Diagnosis not present

## 2021-11-21 DIAGNOSIS — M25511 Pain in right shoulder: Secondary | ICD-10-CM | POA: Diagnosis not present

## 2021-11-21 DIAGNOSIS — M25551 Pain in right hip: Secondary | ICD-10-CM | POA: Diagnosis not present

## 2021-11-21 DIAGNOSIS — M353 Polymyalgia rheumatica: Secondary | ICD-10-CM | POA: Diagnosis not present

## 2021-11-21 DIAGNOSIS — M791 Myalgia, unspecified site: Secondary | ICD-10-CM | POA: Diagnosis not present

## 2021-11-21 DIAGNOSIS — M25512 Pain in left shoulder: Secondary | ICD-10-CM | POA: Diagnosis not present

## 2021-11-24 ENCOUNTER — Ambulatory Visit: Payer: Medicare Other | Admitting: Family Medicine

## 2021-11-29 ENCOUNTER — Other Ambulatory Visit: Payer: Self-pay | Admitting: Family Medicine

## 2021-11-29 DIAGNOSIS — R102 Pelvic and perineal pain: Secondary | ICD-10-CM | POA: Diagnosis not present

## 2021-11-29 DIAGNOSIS — N200 Calculus of kidney: Secondary | ICD-10-CM | POA: Diagnosis not present

## 2021-11-29 DIAGNOSIS — N1330 Unspecified hydronephrosis: Secondary | ICD-10-CM | POA: Diagnosis not present

## 2021-12-01 DIAGNOSIS — R1084 Generalized abdominal pain: Secondary | ICD-10-CM | POA: Diagnosis not present

## 2021-12-01 DIAGNOSIS — K449 Diaphragmatic hernia without obstruction or gangrene: Secondary | ICD-10-CM | POA: Diagnosis not present

## 2021-12-01 DIAGNOSIS — R109 Unspecified abdominal pain: Secondary | ICD-10-CM | POA: Diagnosis not present

## 2021-12-01 DIAGNOSIS — Z87442 Personal history of urinary calculi: Secondary | ICD-10-CM | POA: Diagnosis not present

## 2021-12-01 DIAGNOSIS — N3289 Other specified disorders of bladder: Secondary | ICD-10-CM | POA: Diagnosis not present

## 2021-12-05 DIAGNOSIS — H52223 Regular astigmatism, bilateral: Secondary | ICD-10-CM | POA: Diagnosis not present

## 2021-12-06 ENCOUNTER — Other Ambulatory Visit: Payer: Self-pay | Admitting: Family Medicine

## 2021-12-06 ENCOUNTER — Telehealth: Payer: Self-pay | Admitting: Family Medicine

## 2021-12-06 DIAGNOSIS — M2550 Pain in unspecified joint: Secondary | ICD-10-CM | POA: Diagnosis not present

## 2021-12-06 DIAGNOSIS — M1990 Unspecified osteoarthritis, unspecified site: Secondary | ICD-10-CM | POA: Diagnosis not present

## 2021-12-06 DIAGNOSIS — M791 Myalgia, unspecified site: Secondary | ICD-10-CM | POA: Diagnosis not present

## 2021-12-06 DIAGNOSIS — N289 Disorder of kidney and ureter, unspecified: Secondary | ICD-10-CM

## 2021-12-06 DIAGNOSIS — M3501 Sicca syndrome with keratoconjunctivitis: Secondary | ICD-10-CM | POA: Diagnosis not present

## 2021-12-06 NOTE — Telephone Encounter (Signed)
Pt called and wanted to give an update: Seen Dr.Syad her rheumatologist and found out GFR dropped to 3. She is also in the process of passing a kidney stone and is dealing with really bad swelling. She stated the provider suggest she be seen by a nephrologist due to her kidney function and it suggested that the PCP refer her to one.

## 2021-12-07 NOTE — Telephone Encounter (Signed)
Spoke with pt, she will contact the rheumatologist to fax over labs

## 2021-12-12 ENCOUNTER — Encounter: Payer: Self-pay | Admitting: Family Medicine

## 2021-12-12 ENCOUNTER — Ambulatory Visit (INDEPENDENT_AMBULATORY_CARE_PROVIDER_SITE_OTHER): Payer: Medicare Other | Admitting: Family Medicine

## 2021-12-12 DIAGNOSIS — R1310 Dysphagia, unspecified: Secondary | ICD-10-CM

## 2021-12-12 DIAGNOSIS — N289 Disorder of kidney and ureter, unspecified: Secondary | ICD-10-CM | POA: Diagnosis not present

## 2021-12-12 DIAGNOSIS — I10 Essential (primary) hypertension: Secondary | ICD-10-CM

## 2021-12-12 DIAGNOSIS — R739 Hyperglycemia, unspecified: Secondary | ICD-10-CM

## 2021-12-12 DIAGNOSIS — M810 Age-related osteoporosis without current pathological fracture: Secondary | ICD-10-CM | POA: Diagnosis not present

## 2021-12-12 DIAGNOSIS — E785 Hyperlipidemia, unspecified: Secondary | ICD-10-CM | POA: Diagnosis not present

## 2021-12-12 DIAGNOSIS — E538 Deficiency of other specified B group vitamins: Secondary | ICD-10-CM

## 2021-12-12 DIAGNOSIS — R053 Chronic cough: Secondary | ICD-10-CM

## 2021-12-12 DIAGNOSIS — N3010 Interstitial cystitis (chronic) without hematuria: Secondary | ICD-10-CM

## 2021-12-12 DIAGNOSIS — M3500 Sicca syndrome, unspecified: Secondary | ICD-10-CM

## 2021-12-12 DIAGNOSIS — M2550 Pain in unspecified joint: Secondary | ICD-10-CM

## 2021-12-12 HISTORY — DX: Sjogren syndrome, unspecified: M35.00

## 2021-12-12 LAB — LIPID PANEL
Cholesterol: 177 mg/dL (ref 0–200)
HDL: 46.1 mg/dL (ref >39.00)
LDL Cholesterol: 96 mg/dL (ref 0–99)
NonHDL: 130.5
Total CHOL/HDL Ratio: 4
Triglycerides: 173 mg/dL — ABNORMAL HIGH (ref 0.0–149.0)
VLDL: 34.6 mg/dL (ref 0.0–40.0)

## 2021-12-12 LAB — COMPREHENSIVE METABOLIC PANEL
ALT: 11 U/L (ref 0–35)
AST: 19 U/L (ref 0–37)
Albumin: 4 g/dL (ref 3.5–5.2)
Alkaline Phosphatase: 119 U/L — ABNORMAL HIGH (ref 39–117)
BUN: 15 mg/dL (ref 6–23)
CO2: 30 mEq/L (ref 19–32)
Calcium: 9.5 mg/dL (ref 8.4–10.5)
Chloride: 102 mEq/L (ref 96–112)
Creatinine, Ser: 1.14 mg/dL (ref 0.40–1.20)
GFR: 46.78 mL/min — ABNORMAL LOW (ref >60.00)
Glucose, Bld: 100 mg/dL — ABNORMAL HIGH (ref 70–99)
Potassium: 4.6 mEq/L (ref 3.5–5.1)
Sodium: 140 mEq/L (ref 135–145)
Total Bilirubin: 0.5 mg/dL (ref 0.2–1.2)
Total Protein: 6.6 g/dL (ref 6.0–8.3)

## 2021-12-12 LAB — CBC
HCT: 38.6 % (ref 36.0–46.0)
Hemoglobin: 12.5 g/dL (ref 12.0–15.0)
MCHC: 32.3 g/dL (ref 30.0–36.0)
MCV: 92.7 fl (ref 78.0–100.0)
Platelets: 327 10*3/uL (ref 150.0–400.0)
RBC: 4.17 Mil/uL (ref 3.87–5.11)
RDW: 15.9 % — ABNORMAL HIGH (ref 11.5–15.5)
WBC: 8.9 10*3/uL (ref 4.0–10.5)

## 2021-12-12 LAB — VITAMIN B12: Vitamin B-12: >1550 pg/mL — ABNORMAL HIGH (ref 211–911)

## 2021-12-12 LAB — TSH: TSH: 1.23 u[IU]/mL (ref 0.35–5.50)

## 2021-12-12 LAB — HEMOGLOBIN A1C: Hgb A1c MFr Bld: 5.9 % (ref 4.6–6.5)

## 2021-12-12 LAB — VITAMIN D 25 HYDROXY (VIT D DEFICIENCY, FRACTURES): VITD: 35.77 ng/mL (ref 30.00–100.00)

## 2021-12-12 NOTE — Assessment & Plan Note (Signed)
Had been doing better but started again last night.

## 2021-12-12 NOTE — Progress Notes (Signed)
Subjective:   By signing my name below, I, [name], attest that this documentation has been prepared under the direction and in the presence of Sheri Lukes, MD. 12/12/2021   Patient ID: [name], female    DOB: November 10, 1946, 75 y.o.   MRN: [id number]  Chief Complaint  Patient presents with   4 months up    HPI Patient is in today for an office visit.  She recently saw a rheumatologist and labs showed an increase in the GFR levels. She was recently diagnosed with Sjogren's syndrome and mild rheumatoid arthritis.She was started on 200 mg plaquenil to manage her body pain and is tolerating it. She is trying to change her diet.   She reports swelling in her feet, right worse than left. She stayed home yesterday and the swelling has reduced a little. Normally around lunch, her feet are very swollen.    She saw a nephrologist 3 weeks ago and was diagnosed with a kidney stone in her right kidney. The symptoms have not worsened but endorses dysuria. She drinks about 64 oz of water daily and does not drink tea or carbonated drinks.  She has had erythematous skin lesions on her lower legs for the past 6 months. They are non-tender and have not spread to other parts of the legs.  She will be having an upper endoscopy and colonoscopy to evaluate her dysphagia. She reports it is worsening and her voice has become deeper. Her GERD is also getting worse.  CAT scan done by neurology was clear.  Past Medical History:  Diagnosis Date   Allergy    Anemia    Anxiety    past hx    Arthritis    Asthma    Atrial flutter ([place])    past history- not current   Breast cancer ([place])    CAD (coronary artery disease) CARDIOLOGIST--  DR Angelena Form   mild non-obstructive cad   Cancer ([place])    right   Cataract    bilaterally removed    Chronic constipation    Chronic kidney disease    interstitial cystitis   Concussion    x 3   COPD (chronic obstructive pulmonary disease) ([place])    Depression     past hx    Family history of malignant hyperthermia    father had this   Fibromyalgia    Frequency of urination    GERD (gastroesophageal reflux disease)    H/O hiatal hernia    History of basal cell carcinoma excision    X2   History of breast cancer ONCOLOGIST-- DR Jana Hakim---  NO RECURRANCE   DX 07/2012;  LOW GRADE DCIS  ER+PR+  ----  S/P RIGHT LUMPECTOMY WITH NEGATIVE MARGINS/   RADIATION ENDED 11/2012   History of chronic bronchitis    History of colonic polyps    History of tachycardia    CONTROLLED  WITH ATENOLOL   Hyperlipidemia    Hypertension    Neuromuscular disorder (HCC)    fibromyalgia   Neuropathy    Osteoporosis 01/2019   T score -2.2 stable/improved from prior study   Pelvic pain    Personal history of radiation therapy    S/P radiation therapy 11/12/12 - 12/05/12   right Breast   Sepsis ([place]) 2014   from UTI    Sinus headache    Urgency of urination     Past Surgical History:  Procedure Laterality Date   24 HOUR [place] STUDY N/A 04/03/2016  Procedure: [place] STUDY;  Surgeon: Manus Gunning, MD;  Location: [facility];  Service: Gastroenterology;  Laterality: N/A;   BREAST BIOPSY Right 08/23/2012   ADH   BREAST BIOPSY Right 10/11/2012   Ductal Carcinoma   BREAST EXCISIONAL BIOPSY Right 09/04/2013   benign   BREAST LUMPECTOMY Right 10-11-2012   W/ [facility] BX   CARDIAC CATHETERIZATION  09-13-2007  DR Lia Foyer   WELL-PRESERVED LVF/  DIFFUSE SCATTERED CORONARY CALCIFACATION AND ATHEROSCLEROSIS WITHOUT OBSTRUCTION   CARDIAC CATHETERIZATION  08-04-2010  DR MCALHANY   NON-OBSTRUCTIVE CAD/  pLAD 40%/  oLAD 30%/  mLAD 30%/  pRCA 30%/  EF 60%   CARDIOVASCULAR STRESS TEST  06-18-2012  DR McALHANY   LOW RISK NUCLEAR STUDY/  SMALL FIXED AREA OF MODERATELY DECREASED UPTAKE IN ANTEROSEPTAL WALL WHICH MAY BE ARTIFACTUAL/  NO ISCHEMIA/  EF 68%   COLONOSCOPY  09-29-2010   CYSTOSCOPY     CYSTOSCOPY WITH HYDRODISTENSION AND BIOPSY N/A 03/06/2014   Procedure:  CYSTOSCOPY/HYDRODISTENSION/ INSTILATION OF MARCAINE AND PYRIDIUM;  Surgeon: Ailene Rud, MD;  Location: [facility];  Service: Urology;  Laterality: N/A;   ESOPHAGEAL MANOMETRY N/A 04/03/2016   Procedure: ESOPHAGEAL MANOMETRY (EM);  Surgeon: Manus Gunning, MD;  [facility];  Service: Gastroenterology;  Laterality: N/A;   EXTRACORPOREAL SHOCK WAVE LITHOTRIPSY Left 02/06/2019   Procedure: EXTRACORPOREAL SHOCK WAVE LITHOTRIPSY (ESWL);  Surgeon: Kathie Rhodes, MD;  Location: [facility];  Service: Urology;  Laterality: Left;   NASAL SINUS SURGERY  1985   ORIF RIGHT ANKLE  FX  2006   POLYPECTOMY     REMOVAL VOCAL CORD CYST  FEB 2014   RIGHT BREAST BX  08-23-2012   RIGHT HAND SURGERY  X3  LAST ONE 2009   INCLUDES  ORIF RIGHT 5TH FINGER AND REVISION TWICE   SKIN CANCER EXCISION     TONSILLECTOMY AND ADENOIDECTOMY  AGE 51   TOTAL ABDOMINAL HYSTERECTOMY W/ BILATERAL SALPINGOOPHORECTOMY  1982   W/  APPENDECTOMY   TRANSTHORACIC ECHOCARDIOGRAM  06-24-2012   GRADE I DIASTOLIC DYSFUNCTION/  EF 55-60%/  MILD MR   UPPER GASTROINTESTINAL ENDOSCOPY      Family History  Problem Relation Age of Onset   Rectal cancer Mother    Colon cancer Mother    Pancreatic cancer Mother    Diabetes Mother    Breast cancer Mother 26   Breast cancer Maternal Aunt        breast   Birth defects Maternal Aunt    Irritable bowel syndrome Son    Heart disease Son        CAD, MV replacement   Allergic Disorder Daughter    Diabetes Daughter    Colon cancer Father    Colon polyps Father    Diabetes Father    Stroke Father    Heart disease Father        CHF   Hyperlipidemia Father    Hypertension Father    Arthritis Father    Breast cancer Paternal Aunt    Breast cancer Paternal Aunt    Arthritis Paternal Uncle    Allergic Disorder Daughter    Heart disease Cousin        CAD, had a blood clot after stenting   Esophageal cancer Neg Hx    Stomach cancer Neg Hx     Social  History   Socioeconomic History   Marital status: Married    Spouse name: [name]    Number of children: 3  Years of education: BA   Highest education level: Not on file  Occupational History   Occupation: Retired Programmer, multimedia: RETIRED  Tobacco Use   Smoking status: Former    Packs/day: 1.00    Years: 15.00    Pack years: 15.00    Types: Cigarettes    Quit date: 05/27/2005    Years since quitting: 16.5   Smokeless tobacco: Never  Vaping Use   Vaping Use: Never used  Substance and Sexual Activity   Alcohol use: No    Alcohol/week: 0.0 standard drinks   Drug use: No   Sexual activity: Yes    Partners: Male    Comment: 1st intercourse 75 yo-Fewer than 5 partners  Other Topics Concern   Not on file  Social History Narrative   Lives with husband.   Caffeine use: 1/2 cup per day   Exercise-- 2days a week [employer],  Water aerobics, walking       Retired Marine scientist   No dietary restrictions, tries to maintain a heart healthy diet   Social Determinants of Radio broadcast assistant Strain: Not on file  Food Insecurity: Not on file  Transportation Needs: Not on file  Physical Activity: Not on file  Stress: Not on file  Social Connections: Not on file  Intimate Partner Violence: Not on file    Outpatient Medications Prior to Visit  Medication Sig Dispense Refill   acetaminophen (TYLENOL) 500 MG tablet Take 1,000 mg by mouth every 6 (six) hours as needed for mild pain or headache.      albuterol (VENTOLIN HFA) 108 (90 Base) MCG/ACT inhaler Inhale 2 puffs into the lungs every 4 (four) hours as needed. 1 each 0   amLODipine (NORVASC) 10 MG tablet Take 1 tablet (10 mg total) by mouth daily. 90 tablet 3   aspirin EC 81 MG tablet Take 1 tablet (81 mg total) by mouth daily. 90 tablet 3   atenolol (TENORMIN) 50 MG tablet Take 1 tablet by mouth twice daily 180 tablet 0   Cyanocobalamin (VITAMIN B-12) 5000 MCG TBDP Take 1 tablet by mouth daily with breakfast.     denosumab (PROLIA) 60  MG/ML SOSY injection Inject 60 mg into the skin every 6 (six) months.     DULoxetine (CYMBALTA) 60 MG capsule Take 2 capsules by mouth once daily 180 capsule 1   Estradiol 10 MCG TABS vaginal tablet Place 1 tablet (10 mcg total) vaginally 2 (two) times a week. 24 tablet 4   fluticasone (FLONASE) 50 MCG/ACT nasal spray Use 2 spray(s) in each nostril once daily 16 g 5   furosemide (LASIX) 20 MG tablet Take 1 tablet (20 mg total) by mouth as needed for edema. 90 tablet 3   gabapentin (NEURONTIN) 800 MG tablet Take 1 tablet (800 mg total) by mouth 3 (three) times daily. 270 tablet 1   hydroxychloroquine (PLAQUENIL) 200 MG tablet 1 tablet with food or milk     losartan (COZAAR) 100 MG tablet Take 1 tablet (100 mg total) by mouth daily. 60 tablet 0   methylcellulose (CITRUCEL) oral powder Take as directed once daily     mupirocin ointment (BACTROBAN) 2 % Apply thin film twice daily. 22 g 0   nitroGLYCERIN (NITROSTAT) 0.4 MG SL tablet Place 1 tablet (0.4 mg total) under the tongue every 5 (five) minutes as needed for chest pain. 25 tablet 1   nystatin cream (MYCOSTATIN) Apply 1 application topically 2 (two) times daily. 30 g 2  pantoprazole (PROTONIX) 40 MG tablet Take 1 tablet (40 mg total) by mouth daily. 90 tablet 1   pentosan polysulfate (ELMIRON) 100 MG capsule Take 1 capsule (100 mg total) by mouth 3 (three) times daily. Reported on 01/05/2016 (Patient taking differently: Take 100 mg by mouth 3 (three) times daily with meals as needed. Reported on 01/05/2016) 90 capsule 11   polyethylene glycol (MIRALAX) 17 g packet Take 17 g by mouth daily. 14 each 0   REPATHA SURECLICK 333 MG/ML SOAJ INJECT 140 MG SUBCUTANEOUSLY EVERY 14 DAYS 2 mL 11   sucralfate (CARAFATE) 1 GM/10ML suspension Take 10 mLs (1 g total) by mouth 4 (four) times daily -  with meals and at bedtime. 420 mL 1   traZODone (DESYREL) 100 MG tablet TAKE 1 TABLET BY MOUTH AT BEDTIME 90 tablet 1   hyoscyamine (LEVSIN SL) 0.125 MG SL tablet  Place 1 tablet (0.125 mg total) under the tongue every 4 (four) hours as needed. 30 tablet 0   No facility-administered medications prior to visit.    Allergies  Allergen Reactions   Clindamycin Rash and Shortness Of Breath   Clindamycin Hcl Shortness Of Breath and Rash   Penicillins Anaphylaxis   Rosuvastatin Anaphylaxis   Baclofen Other (See Comments) and Rash   Lincomycin Other (See Comments)   Clindamycin Hcl     Other reaction(s): Shortness of Breath, Rash   Codeine Hives and Other (See Comments)    headache Other reaction(s): Headache, Nausea, headache   Erythromycin Hives   Erythromycin Base Other (See Comments)    other   Fluzone [Influenza Virus Vaccine] Other (See Comments)    Local reaction at the site   Haemophilus Influenzae Other (See Comments)    Local reaction at the site Local reaction at the site   Haemophilus Influenzae Vaccines Other (See Comments)    Local reaction at site   Latex Hives   Other Other (See Comments)    Local reaction at site   Pentazocine Other (See Comments)   Pentazocine Lactate Other (See Comments)    HALLUCINATION   Pneumococcal Vaccine Hives and Swelling    Other reaction(s): Hives, Shortness of Breath   Pneumococcal Vaccine Polyvalent Hives, Swelling and Other (See Comments)    REACTION: redness, swelling, and hives at injection site   Tamoxifen Nausea And Vomiting and Other (See Comments)    HEADACHE Other reaction(s): Headache, Nausea    Review of Systems  Constitutional:  Negative for fever and malaise/fatigue.  HENT:  Negative for congestion.        (+) dysphagia  Eyes:  Negative for redness.  Respiratory:  Positive for cough. Negative for shortness of breath.   Cardiovascular:  Negative for chest pain, palpitations and leg swelling.  Gastrointestinal:  Negative for abdominal pain, blood in stool and nausea.  Genitourinary:  Positive for dysuria. Negative for frequency.  Musculoskeletal:  Positive for myalgias.  Negative for falls.       (+) swelling in feet bilaterally  Skin:  Negative for rash.  Neurological:  Negative for dizziness, loss of consciousness and headaches.  Endo/Heme/Allergies:  Negative for polydipsia.  Psychiatric/Behavioral:  Negative for depression. The patient is not nervous/anxious.       Objective:    Physical Exam Constitutional:      General: She is not in acute distress.    Appearance: She is well-developed.  HENT:     Head: Normocephalic and atraumatic.  Eyes:     Conjunctiva/sclera: Conjunctivae normal.  Neck:  Thyroid: No thyromegaly.  Cardiovascular:     Rate and Rhythm: Normal rate and regular rhythm.     Heart sounds: Normal heart sounds. No murmur heard. Pulmonary:     Effort: Pulmonary effort is normal. No respiratory distress.     Breath sounds: Normal breath sounds.  Abdominal:     General: Bowel sounds are normal. There is no distension.     Palpations: Abdomen is soft. There is no mass.     Tenderness: There is no abdominal tenderness.  Musculoskeletal:     Cervical back: Neck supple.  Lymphadenopathy:     Cervical: No cervical adenopathy.  Skin:    General: Skin is warm and dry.     Comments: Red skin lesions on legs  Neurological:     Mental Status: She is alert and oriented to person, place, and time.  Psychiatric:        Behavior: Behavior normal.    BP 108/62    Pulse 85    Temp 98.7 F (37.1 C)    Resp 16    Ht _0  (1.676 m)    Wt 135 lb 3.2 oz (61.3 kg)    SpO2 97%    BMI 21.82 kg/m  Wt Readings from Last 3 Encounters:  12/12/21 135 lb 3.2 oz (61.3 kg)  11/10/21 134 lb (60.8 kg)  10/19/21 137 lb 3.2 oz (62.2 kg)    Diabetic Foot Exam - Simple   No data filed    Lab Results  Component Value Date   WBC 7.2 09/02/2021   HGB 11.6 (L) 09/02/2021   HCT 36.4 09/02/2021   PLT 268.0 09/02/2021   GLUCOSE 109 (H) 09/02/2021   CHOL 129 09/02/2021   TRIG 163.0 (H) 09/02/2021   HDL 40.90 09/02/2021   LDLDIRECT 117.3  04/08/2012   LDLCALC 55 09/02/2021   ALT 9 09/02/2021   AST 16 09/02/2021   NA 143 09/02/2021   K 3.9 09/02/2021   CL 104 09/02/2021   CREATININE 1.04 09/02/2021   BUN 11 09/02/2021   CO2 32 09/02/2021   TSH 5.10 09/02/2021   INR 1.05 08/03/2010   HGBA1C 5.8 09/02/2021   MICROALBUR 0.2 07/10/2013    Lab Results  Component Value Date   TSH 5.10 09/02/2021   Lab Results  Component Value Date   WBC 7.2 09/02/2021   HGB 11.6 (L) 09/02/2021   HCT 36.4 09/02/2021   MCV 93.8 09/02/2021   PLT 268.0 09/02/2021   Lab Results  Component Value Date   NA 143 09/02/2021   K 3.9 09/02/2021   CHLORIDE 102 04/20/2015   CO2 32 09/02/2021   GLUCOSE 109 (H) 09/02/2021   BUN 11 09/02/2021   CREATININE 1.04 09/02/2021   BILITOT 0.4 09/02/2021   ALKPHOS 85 09/02/2021   AST 16 09/02/2021   ALT 9 09/02/2021   PROT 5.9 (L) 09/02/2021   ALBUMIN 3.5 09/02/2021   CALCIUM 8.7 09/02/2021   ANIONGAP 12 (H) 04/20/2015   EGFR 49 (L) 04/20/2015   GFR 52.33 (L) 09/02/2021   Lab Results  Component Value Date   CHOL 129 09/02/2021   Lab Results  Component Value Date   HDL 40.90 09/02/2021   Lab Results  Component Value Date   LDLCALC 55 09/02/2021   Lab Results  Component Value Date   TRIG 163.0 (H) 09/02/2021   Lab Results  Component Value Date   CHOLHDL 3 09/02/2021   Lab Results  Component Value Date   HGBA1C 5.8  09/02/2021       Assessment & Plan:   Problem List Items Addressed This Visit     Essential hypertension    Well controlled, no changes to meds. Encouraged heart healthy diet such as the DASH diet and exercise as tolerated.       Relevant Orders   CBC   Comprehensive metabolic panel   TSH   Osteoporosis    Encouraged to get adequate exercise, calcium and vitamin d intake      Relevant Orders   VITAMIN D 25 Hydroxy (Vit-D Deficiency, Fractures)   Arthralgia    Is now following with Dr Dossie Der rheumatology, they have diagnosed Sjogren's Rheumatoid  arthritis, has a positive ANA so possible autoimmune lupus picture as well as her fibromyalgia. She has been started on Hydroxychloroquine just one a day will increase to 2 a day. Stay as active as possible      Renal insufficiency    Worsening repeat cmp, has already been referred to [facility] and is waiting on an appt. She is hydrating well. About 65 oz a day      Hyperlipidemia    Encourage heart healthy diet such as MIND or DASH diet, increase exercise, avoid trans fats, simple carbohydrates and processed foods, consider a krill or fish or flaxseed oil cap daily.       Relevant Orders   Lipid panel   Dysphagia    Is following with Dr Havery Moros and is now noting some hoarseness and so she is having an upper and lower endoscopy soon.       Chronic interstitial cystitis    Following with [facility] and they found a kidney stone as well      Chronic cough    Had been doing better but started again last night.       Vitamin B12 deficiency    Supplement and monitor      Relevant Orders   Vitamin B12   Hyperglycemia    hgba1c acceptable, minimize simple carbs. Increase exercise as tolerated      Relevant Orders   Hemoglobin A1c   Sjogren's disease ([place])    Following with rheumatolgy now        No orders of the defined types were placed in this encounter.   I,[name],acting as a Education administrator for [name], MD.,have documented all relevant documentation on the behalf of [name], MD,as directed by  [name], MD while in the presence of [name], MD.   I, Sheri Lukes, MD. , personally preformed the services described in this documentation.  All medical record entries made by the scribe were at my direction and in my presence.  I have reviewed the chart and discharge instructions (if applicable) and agree that the record reflects my personal performance and is accurate and complete. 12/12/2021

## 2021-12-12 NOTE — Assessment & Plan Note (Signed)
Following with rheumatolgy now

## 2021-12-12 NOTE — Assessment & Plan Note (Signed)
Is following with Dr Havery Moros and is now noting some hoarseness and so she is having an upper and lower endoscopy soon.

## 2021-12-12 NOTE — Patient Instructions (Addendum)
Kidney Stones Kidney stones are solid, rock-like deposits that form inside of the kidneys. The kidneys are a pair of organs that make urine. A kidney stone may form in a kidney and move into other parts of the urinary tract, including the tubes that connect the kidneys to the bladder (ureters), the bladder, and the tube that carries urine out of the body (urethra). As the stone moves through these areas, it can cause intense pain and block the flow of urine. Kidney stones are created when high levels of certain minerals are found in the urine. The stones are usually passed out of the body through urination, but in some cases, medical treatment may be needed to remove them. What are the causes? Kidney stones may be caused by: A condition in which certain glands produce too much parathyroid hormone (primary hyperparathyroidism), which causes too much calcium buildup in the blood. A buildup of uric acid crystals in the bladder (hyperuricosuria). Uric acid is a chemical that the body produces when you eat certain foods. It usually exits the body in the urine. Narrowing (stricture) of one or both of the ureters. A kidney blockage that is present at birth (congenital obstruction). Past surgery on the kidney or the ureters, such as gastric bypass surgery. What increases the risk? The following factors may make you more likely to develop this condition: Having had a kidney stone in the past. Having a family history of kidney stones. Not drinking enough water. Eating a diet that is high in protein, salt (sodium), or sugar. Being overweight or obese. What are the signs or symptoms? Symptoms of a kidney stone may include: Pain in the side of the abdomen, right below the ribs (flank pain). Pain usually spreads (radiates) to the groin. Needing to urinate frequently or urgently. Painful urination. Blood in the urine (hematuria). Nausea. Vomiting. Fever and chills. How is this diagnosed? This condition  may be diagnosed based on: Your symptoms and medical history. A physical exam. Blood tests. Urine tests. These may be done before and after the stone passes out of your body through urination. Imaging tests, such as a CT scan, abdominal X-ray, or ultrasound. A procedure to examine the inside of the bladder (cystoscopy). How is this treated? Treatment for kidney stones depends on the size, location, and makeup of the stones. Kidney stones will often pass out of the body through urination. You may need to: Increase your fluid intake to help pass the stone. In some cases, you may be given fluids through an IV and may need to be monitored at the hospital. Take medicine for pain. Make changes in your diet to help prevent kidney stones from coming back. Sometimes, medical procedures are needed to remove a kidney stone. This may involve: A procedure to break up kidney stones using: A focused beam of light (laser therapy). Shock waves (extracorporeal shock wave lithotripsy). Surgery to remove kidney stones. This may be needed if you have severe pain or have stones that block your urinary tract. Follow these instructions at home: Medicines Take over-the-counter and prescription medicines only as told by your health care provider. Ask your health care provider if the medicine prescribed to you requires you to avoid driving or using heavy machinery. Eating and drinking Drink enough fluid to keep your urine pale yellow. You may be instructed to drink at least 8-10 glasses of water each day. This will help you pass the kidney stone. If directed, change your diet. This may include: Limiting how much sodium  you eat. Eating more fruits and vegetables. Limiting how much animal protein--such as red meat, poultry, fish, and eggs--you eat. Follow instructions from your health care provider about eating or drinking restrictions. General instructions Collect urine samples as told by your health care provider.  You may need to collect a urine sample: 24 hours after you pass the stone. 8-12 weeks after passing the kidney stone, and every 6-12 months after that. Strain your urine every time you urinate, for as long as directed. Use the strainer that your health care provider recommends. Do not throw out the kidney stone after passing it. Keep the stone so it can be tested by your health care provider. Testing the makeup of your kidney stone may help prevent you from getting kidney stones in the future. Keep all follow-up visits as told by your health care provider. This is important. You may need follow-up X-rays or ultrasounds to make sure that your stone has passed. How is this prevented? To prevent another kidney stone: Drink enough fluid to keep your urine pale yellow. This is the best way to prevent kidney stones. Eat a healthy diet and follow recommendations from your health care provider about foods to avoid. You may be instructed to eat a low-protein diet. Recommendations vary depending on the type of kidney stone that you have. Maintain a healthy weight. Where to find more information [place] [facility]): www.kidney.[facility]): www.urologyhealth.org Contact a health care provider if: You have pain that gets worse or does not get better with medicine. Get help right away if: You have a fever or chills. You develop severe pain. You develop new abdominal pain. You faint. You are unable to urinate. Summary Kidney stones are solid, rock-like deposits that form inside of the kidneys. Kidney stones can cause nausea, vomiting, blood in the urine, abdominal pain, and the urge to urinate frequently. Treatment for kidney stones depends on the size, location, and makeup of the stones. Kidney stones will often pass out of the body through urination. Kidney stones can be prevented by drinking enough fluids, eating a healthy diet, and maintaining a healthy weight. This  information is not intended to replace advice given to you by your health care provider. Make sure you discuss any questions you have with your health care provider. Document Revised: 04/25/2019 Document Reviewed: 04/29/2019 Elsevier Patient Education  [place].

## 2021-12-12 NOTE — Assessment & Plan Note (Signed)
Encourage heart healthy diet such as MIND or DASH diet, increase exercise, avoid trans fats, simple carbohydrates and processed foods, consider a krill or fish or flaxseed oil cap daily.  °

## 2021-12-12 NOTE — Assessment & Plan Note (Signed)
[id number] acceptable, minimize simple carbs. Increase exercise as tolerated.  

## 2021-12-12 NOTE — Assessment & Plan Note (Signed)
Worsening repeat cmp, has already been referred to [facility] and is waiting on an appt. She is hydrating well. About 65 oz a day

## 2021-12-12 NOTE — Assessment & Plan Note (Addendum)
Is now following with Dr Dossie Der rheumatology, they have diagnosed Sjogren's Rheumatoid arthritis, has a positive ANA so possible autoimmune lupus picture as well as her fibromyalgia. She has been started on Hydroxychloroquine just one a day will increase to 2 a day. Stay as active as possible

## 2021-12-12 NOTE — Assessment & Plan Note (Signed)
Supplement and monitor 

## 2021-12-12 NOTE — Assessment & Plan Note (Signed)
Encouraged to get adequate exercise, calcium and vitamin d intake 

## 2021-12-12 NOTE — Assessment & Plan Note (Signed)
Well controlled, no changes to meds. Encouraged heart healthy diet such as the DASH diet and exercise as tolerated.  °

## 2021-12-12 NOTE — Assessment & Plan Note (Signed)
Following with [facility] and they found a kidney stone as well

## 2021-12-14 NOTE — Progress Notes (Signed)
ID: [name]   DOB: 20-Nov-1946  MR#: [id number]  GNO#:[id number]  Patient Care Team: Sheri Lukes, MD as PCP - General (Family Medicine) Sheri Brownie, MD as Consulting Physician (Dermatology) Sheri Gobble, MD as Consulting Physician (Pulmonary Disease) [name], Virgie Dad, MD as Consulting Physician (Oncology) Sheri Breed Denice Bors, MD as Consulting Physician (Cardiology) [name], Sheri Raspberry, MD as Consulting Physician (Gastroenterology) Sheri Broad, MD as Consulting Physician (Physical Medicine and Rehabilitation) [name], Sheri Block, MD (Inactive) as Consulting Physician (Gynecology) Sheri Belfast, MD as Consulting Physician (Otolaryngology) Sheri Cancel, MD as Consulting Physician (Orthopedic Surgery) Sheri Rhodes, MD (Inactive) as Consulting Physician (Urology) OTHER MD:   CHIEF COMPLAINT: noninvasive breast cancer  CURRENT TREATMENT: observation   INTERVAL HISTORY: [name] returns today for follow-up of her noninvasive breast cancer. She is accompanied by her husband. I last saw her on 07/10/2019.  Her most recent bilateral screening mammography was performed on 04/29/2021 and showed: breast density category C; no evidence of malignancy.  She receives prolia through her gynecologist's office. Her most recent bone density screening on 04/06/2021 showed a T-score of -2.1, which is considered osteopenic.   REVIEW OF SYSTEMS: [name] tells me she consinutes to have problems with sciatica, neuropathy and fibromyalgia. While that is generally stable, she is concerned about variable renal function and has been scheduled for nephrology evaluation. She denies excessive NSAID use. She does some home excercises but has balance problems and fell recently. She went through rehab and is now using a cane outside the home.   COVID 19 VACCINATION STATUS: Pfizer x4, last 07/2021    HISTORY OF PRESENT ILLNESS: From the original intake note:  [name] noted a minimal discharge from the  right breast early and 2013. She did not think much of this until it became more copious him a and then she brought it to Dr. Ashley [name] attention and bilateral breast MRI 07/29/2012 showed 2 areas of concern in the right breast. Right breast biopsy x3 was obtained 08/23/2012, and showed ([id number]) atypical ductal hyperplasia partially involving a papillary lesion. Accordingly, on 10/11/2012 the patient underwent right lumpectomy for what proved to be ([id number] ductal carcinoma in situ, low-grade, measuring approximately 3.2 cm, with negative margins (0.15 cm), 100% estrogen receptor positive, 61% progesterone receptor positive.  With his results the patient was referred to radiation oncology and she is currently receiving of radiation treatments under Dr. Isidore [name], scheduled to be completed mid December 2013.  The patient's subsequent history is as detailed below.   PAST MEDICAL HISTORY: Past Medical History:  Diagnosis Date   Allergy    Anemia    Anxiety    past hx    Arthritis    Asthma    Atrial flutter ([place])    past history- not current   Breast cancer ([place])    CAD (coronary artery disease) CARDIOLOGIST--  DR [name]   mild non-obstructive cad   Cancer ([place])    right   Cataract    bilaterally removed    Chronic constipation    Chronic kidney disease    interstitial cystitis   Concussion    x 3   COPD (chronic obstructive pulmonary disease) (HCC)    Depression    past hx    Family history of malignant hyperthermia    father had this   Fibromyalgia    Frequency of urination    GERD (gastroesophageal reflux disease)    H/O hiatal hernia    History of basal cell carcinoma excision  X2   History of breast cancer ONCOLOGIST-- DR [name]---  NO RECURRANCE   DX 07/2012;  LOW GRADE DCIS  ER+PR+  ----  S/P RIGHT LUMPECTOMY WITH NEGATIVE MARGINS/   RADIATION ENDED 11/2012   History of chronic bronchitis    History of colonic polyps    History of tachycardia     CONTROLLED  WITH ATENOLOL   Hyperlipidemia    Hypertension    Neuromuscular disorder (HCC)    fibromyalgia   Neuropathy    Osteoporosis 01/2019   T score -2.2 stable/improved from prior study   Pelvic pain    Personal history of radiation therapy    S/P radiation therapy 11/12/12 - 12/05/12   right Breast   Sepsis ([place]) 2014   from UTI    Sinus headache    Urgency of urination     PAST SURGICAL HISTORY: Past Surgical History:  Procedure Laterality Date   25 HOUR [place] STUDY N/A 04/03/2016   Procedure: 24 HOUR [place] STUDY;  Surgeon: Sheri Gunning, MD;  Location: [facility];  Service: Gastroenterology;  Laterality: N/A;   BREAST BIOPSY Right 08/23/2012   ADH   BREAST BIOPSY Right 10/11/2012   Ductal Carcinoma   BREAST EXCISIONAL BIOPSY Right 09/04/2013   benign   BREAST LUMPECTOMY Right 10-11-2012   W/ SLN BX   CARDIAC CATHETERIZATION  09-13-2007  DR [name]   WELL-PRESERVED LVF/  DIFFUSE SCATTERED CORONARY CALCIFACATION AND ATHEROSCLEROSIS WITHOUT OBSTRUCTION   CARDIAC CATHETERIZATION  08-04-2010  DR [name]   NON-OBSTRUCTIVE CAD/  pLAD 40%/  oLAD 30%/  mLAD 30%/  pRCA 30%/  EF 60%   CARDIOVASCULAR STRESS TEST  06-18-2012  DR [name]   LOW RISK NUCLEAR STUDY/  SMALL FIXED AREA OF MODERATELY DECREASED UPTAKE IN ANTEROSEPTAL WALL WHICH MAY BE ARTIFACTUAL/  NO ISCHEMIA/  EF 68%   COLONOSCOPY  09-29-2010   CYSTOSCOPY     CYSTOSCOPY WITH HYDRODISTENSION AND BIOPSY N/A 03/06/2014   Procedure: CYSTOSCOPY/HYDRODISTENSION/ INSTILATION OF MARCAINE AND PYRIDIUM;  Surgeon: Sheri Rud, MD;  Location: [facility];  Service: Urology;  Laterality: N/A;   ESOPHAGEAL MANOMETRY N/A 04/03/2016   Procedure: ESOPHAGEAL MANOMETRY (EM);  Surgeon: Sheri Gunning, MD;  Location: [facility];  Service: Gastroenterology;  Laterality: N/A;   EXTRACORPOREAL SHOCK WAVE LITHOTRIPSY Left 02/06/2019   Procedure: EXTRACORPOREAL SHOCK WAVE LITHOTRIPSY (ESWL);  Surgeon:  Sheri Rhodes, MD;  Location: [facility];  Service: Urology;  Laterality: Left;   NASAL SINUS SURGERY  1985   ORIF RIGHT ANKLE  FX  2006   POLYPECTOMY     REMOVAL VOCAL CORD CYST  FEB 2014   RIGHT BREAST BX  08-23-2012   RIGHT HAND SURGERY  X3  LAST ONE 2009   INCLUDES  ORIF RIGHT 5TH FINGER AND REVISION TWICE   SKIN CANCER EXCISION     TONSILLECTOMY AND ADENOIDECTOMY  AGE 52   TOTAL ABDOMINAL HYSTERECTOMY W/ BILATERAL SALPINGOOPHORECTOMY  1982   W/  APPENDECTOMY   TRANSTHORACIC ECHOCARDIOGRAM  06-24-2012   GRADE I DIASTOLIC DYSFUNCTION/  EF 55-60%/  MILD MR   UPPER GASTROINTESTINAL ENDOSCOPY      FAMILY HISTORY Family History  Problem Relation Age of Onset   Rectal cancer Mother    Colon cancer Mother    Pancreatic cancer Mother    Diabetes Mother    Breast cancer Mother 52   Breast cancer Maternal Aunt        breast   Birth defects Maternal Aunt  Irritable bowel syndrome Son    Heart disease Son        CAD, MV replacement   Allergic Disorder Daughter    Diabetes Daughter    Colon cancer Father    Colon polyps Father    Diabetes Father    Stroke Father    Heart disease Father        CHF   Hyperlipidemia Father    Hypertension Father    Arthritis Father    Breast cancer Paternal Aunt    Breast cancer Paternal Aunt    Arthritis Paternal Uncle    Allergic Disorder Daughter    Heart disease Cousin        CAD, had a blood clot after stenting   Esophageal cancer Neg Hx    Stomach cancer Neg Hx     GYNECOLOGIC HISTORY: Menarche age 24, first live birth age 43, TAH/BSO in 65. She was on hormone replacement between 1982 and 2013, at the time of her breast cancer diagnosis.   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 Her husband [name] used to be Insurance underwriter for [employer], now is retired and has helped manage a friend's car lot, something he sees as an extension of his  ministry. Their children are [name], who is an Chief Financial Officer with [employer] in [place]; [name], who works as an Radio producer for [employer] and lives in [place]; and [name], who works as a Radio producer (fourth-grade) in [place].    ADVANCED DIRECTIVES: In place   HEALTH MAINTENANCE: Social History   Tobacco Use   Smoking status: Former    Packs/day: 1.00    Years: 15.00    Pack years: 15.00    Types: Cigarettes    Quit date: 05/27/2005    Years since quitting: 16.5   Smokeless tobacco: Never  Vaping Use   Vaping Use: Never used  Substance Use Topics   Alcohol use: No    Alcohol/week: 0.0 standard drinks   Drug use: No     Colonoscopy: 12/24/2018, [name]  PAP: JAN 2013 ([place])  Bone density: 03/2021, -2.1  Lipid panel:  Allergies  Allergen Reactions   Clindamycin Rash and Shortness Of Breath   Clindamycin Hcl Shortness Of Breath and Rash   Penicillins Anaphylaxis   Rosuvastatin Anaphylaxis   Baclofen Other (See Comments) and Rash   Lincomycin Other (See Comments)   Clindamycin Hcl     Other reaction(s): Shortness of Breath, Rash   Codeine Hives and Other (See Comments)    headache Other reaction(s): Headache, Nausea, headache   Erythromycin Hives   Erythromycin Base Other (See Comments)    other   Fluzone [Influenza Virus Vaccine] Other (See Comments)    Local reaction at the site   Haemophilus Influenzae Other (See Comments)    Local reaction at the site Local reaction at the site   Haemophilus Influenzae Vaccines Other (See Comments)    Local reaction at site   Latex Hives   Other Other (See Comments)    Local reaction at site   Pentazocine Other (See Comments)   Pentazocine Lactate Other (See Comments)    HALLUCINATION   Pneumococcal Vaccine Hives and Swelling    Other reaction(s): Hives, Shortness of Breath   Pneumococcal Vaccine Polyvalent Hives, Swelling and Other (See Comments)    REACTION: redness, swelling, and  hives at injection site   Tamoxifen  Nausea And Vomiting and Other (See Comments)    HEADACHE Other reaction(s): Headache, Nausea    Current Outpatient Medications  Medication Sig Dispense Refill   acetaminophen (TYLENOL) 500 MG tablet Take 1,000 mg by mouth every 6 (six) hours as needed for mild pain or headache.      albuterol (VENTOLIN HFA) 108 (90 Base) MCG/ACT inhaler Inhale 2 puffs into the lungs every 4 (four) hours as needed. 1 each 0   amLODipine (NORVASC) 10 MG tablet Take 1 tablet (10 mg total) by mouth daily. 90 tablet 3   aspirin EC 81 MG tablet Take 1 tablet (81 mg total) by mouth daily. 90 tablet 3   atenolol (TENORMIN) 50 MG tablet Take 1 tablet by mouth twice daily 180 tablet 0   Cyanocobalamin (VITAMIN B-12) 5000 MCG TBDP Take 1 tablet by mouth daily with breakfast.     denosumab (PROLIA) 60 MG/ML SOSY injection Inject 60 mg into the skin every 6 (six) months.     DULoxetine (CYMBALTA) 60 MG capsule Take 2 capsules by mouth once daily 180 capsule 1   Estradiol 10 MCG TABS vaginal tablet Place 1 tablet (10 mcg total) vaginally 2 (two) times a week. 24 tablet 4   fluticasone (FLONASE) 50 MCG/ACT nasal spray Use 2 spray(s) in each nostril once daily 16 g 5   furosemide (LASIX) 20 MG tablet Take 1 tablet (20 mg total) by mouth as needed for edema. 90 tablet 3   gabapentin (NEURONTIN) 800 MG tablet Take 1 tablet (800 mg total) by mouth 3 (three) times daily. 270 tablet 1   hydroxychloroquine (PLAQUENIL) 200 MG tablet 1 tablet with food or milk     hyoscyamine (LEVSIN SL) 0.125 MG SL tablet Place 1 tablet (0.125 mg total) under the tongue every 4 (four) hours as needed. 30 tablet 0   losartan (COZAAR) 100 MG tablet Take 1 tablet (100 mg total) by mouth daily. 60 tablet 0   methylcellulose (CITRUCEL) oral powder Take as directed once daily     mupirocin ointment (BACTROBAN) 2 % Apply thin film twice daily. 22 g 0   nitroGLYCERIN (NITROSTAT) 0.4 MG SL tablet Place 1 tablet (0.4 mg  total) under the tongue every 5 (five) minutes as needed for chest pain. 25 tablet 1   nystatin cream (MYCOSTATIN) Apply 1 application topically 2 (two) times daily. 30 g 2   pantoprazole (PROTONIX) 40 MG tablet Take 1 tablet (40 mg total) by mouth daily. 90 tablet 1   pentosan polysulfate (ELMIRON) 100 MG capsule Take 1 capsule (100 mg total) by mouth 3 (three) times daily. Reported on 01/05/2016 (Patient taking differently: Take 100 mg by mouth 3 (three) times daily with meals as needed. Reported on 01/05/2016) 90 capsule 11   polyethylene glycol (MIRALAX) 17 g packet Take 17 g by mouth daily. 14 each 0   REPATHA SURECLICK 099 MG/ML SOAJ INJECT 140 MG SUBCUTANEOUSLY EVERY 14 DAYS 2 mL 11   sucralfate (CARAFATE) 1 GM/10ML suspension Take 10 mLs (1 g total) by mouth 4 (four) times daily -  with meals and at bedtime. 420 mL 1   traZODone (DESYREL) 100 MG tablet TAKE 1 TABLET BY MOUTH AT BEDTIME 90 tablet 1   No current facility-administered medications for this visit.    OBJECTIVE: middle-aged white woman in no acute distress  Vitals:   12/15/21 1454  BP: (!) 118/51  Pulse: 78  Resp: 18  Temp: 97.7 F (36.5 C)  SpO2: 99%  Body mass index is 21.68 kg/m.    ECOG FS: 1  Sclerae unicteric, EOMs intact Wearing a mask No cervical or supraclavicular adenopathy Lungs no rales or rhonchi Heart regular rate and rhythm Abd soft, nontender, positive bowel sounds MSK no focal spinal tenderness, no upper extremity lymphedema Neuro: nonfocal, well oriented, appropriate affect Breasts: the right breast is s/p lumpectomy and radiation; there is no evidence of  local recurrence. The left breast and both axillae are benign   LAB RESULTS: Lab Results  Component Value Date   WBC 8.9 12/12/2021   NEUTROABS 5.1 09/02/2021   HGB 12.5 12/12/2021   HCT 38.6 12/12/2021   MCV 92.7 12/12/2021   PLT 327.0 12/12/2021      Chemistry      Component Value Date/Time   NA 140 12/12/2021 1152   NA 139  09/03/2018 1114   NA 140 04/20/2015 1050   K 4.6 12/12/2021 1152   K 3.9 04/20/2015 1050   CL 102 12/12/2021 1152   CL 103 11/12/2012 1549   CO2 30 12/12/2021 1152   CO2 26 04/20/2015 1050   BUN 15 12/12/2021 1152   BUN 15 09/03/2018 1114   BUN 13.1 04/20/2015 1050   CREATININE 1.14 12/12/2021 1152   CREATININE 1.1 04/20/2015 1050      Component Value Date/Time   CALCIUM 9.5 12/12/2021 1152   CALCIUM 8.9 04/20/2015 1050   ALKPHOS 119 (H) 12/12/2021 1152   ALKPHOS 87 04/20/2015 1050   AST 19 12/12/2021 1152   AST 23 04/20/2015 1050   ALT 11 12/12/2021 1152   ALT 16 04/20/2015 1050   BILITOT 0.5 12/12/2021 1152   BILITOT 0.3 09/03/2018 1114   BILITOT 0.26 04/20/2015 1050       No results found for: LABCA2  No components found for: LABCA125  No results for input(s): INR in the last 168 hours.  Urinalysis    Component Value Date/Time   COLORURINE YELLOW 03/08/2021 0919   APPEARANCEUR CLEAR 03/08/2021 0919   LABSPEC 1.025 03/08/2021 0919   PHURINE 6.0 03/08/2021 0919   GLUCOSEU NEGATIVE 03/08/2021 0919   HGBUR NEGATIVE 03/08/2021 0919   BILIRUBINUR NEGATIVE 03/08/2021 0919   BILIRUBINUR Small (1+) 07/28/2020 1133   KETONESUR TRACE (A) 03/08/2021 0919   PROTEINUR NEGATIVE 02/22/2021 1258   UROBILINOGEN 1.0 03/08/2021 0919   NITRITE NEGATIVE 03/08/2021 0919   LEUKOCYTESUR NEGATIVE 03/08/2021 0919    STUDIES: No results found.   ASSESSMENT: 76 y.o. [name] woman s/p Right lumpectomy 10/11/2012 for low-grade ductal carcinoma in situ, estrogen and progesterone receptor positive, with negative though close margins  (1) radiation therapy completed 12/05/2012  (2) tamoxifen started at the completion of radiation, tolerated poorly, discontinued 03/10/2013  (3) breast density category D  (a) breast density decreased to category C as of December 2018 mammography   PLAN:  Nalanie is now a little over nine years out from definitive surgery for her non-invasive  breast cancer. This is very favorable.  We discussed her "graduating" vs participating in our survivorship program, but at this point she feels ready to graduate and I am comfortable releasing her to her primary care physicians. All she will need as far as breast cancer is concerned is her yearly mammography and a yearly physician breast exam.  We will be happy to see Shakerra again at any time in the future if and when the need arises but at this point we are not making her any amore routine appointments here.  Total encounter time  25 minutes.*    Denard Tuminello, Virgie Dad, MD  12/15/21 9:14 PM Medical Oncology and Hematology [facility] [place], [place] [address] Tel. [phone]    Fax. [phone]   I, Wilburn Mylar, am acting as scribe for Dr. Virgie Dad. Nautica Hotz.  I, Lurline Del MD, have reviewed the above documentation for accuracy and completeness, and I agree with the above.   *Total Encounter Time as defined by the Centers for Medicare and Medicaid Services includes, in addition to the face-to-face time of a patient visit (documented in the note above) non-face-to-face time: obtaining and reviewing outside history, ordering and reviewing medications, tests or procedures, care coordination (communications with other health care professionals or caregivers) and documentation in the medical record.

## 2021-12-15 ENCOUNTER — Inpatient Hospital Stay: Payer: Medicare Other | Attending: Oncology | Admitting: Oncology

## 2021-12-15 ENCOUNTER — Other Ambulatory Visit: Payer: Self-pay

## 2021-12-15 VITALS — BP 118/51 | HR 78 | Temp 97.7°F | Resp 18 | Ht 66.0 in | Wt 134.3 lb

## 2021-12-15 DIAGNOSIS — Z170 Estrogen receptor positive status [ER+]: Secondary | ICD-10-CM | POA: Diagnosis not present

## 2021-12-15 DIAGNOSIS — C50511 Malignant neoplasm of lower-outer quadrant of right female breast: Secondary | ICD-10-CM

## 2021-12-15 DIAGNOSIS — Z853 Personal history of malignant neoplasm of breast: Secondary | ICD-10-CM | POA: Insufficient documentation

## 2021-12-26 DIAGNOSIS — L578 Other skin changes due to chronic exposure to nonionizing radiation: Secondary | ICD-10-CM | POA: Diagnosis not present

## 2021-12-26 DIAGNOSIS — D485 Neoplasm of uncertain behavior of skin: Secondary | ICD-10-CM | POA: Diagnosis not present

## 2021-12-26 DIAGNOSIS — D225 Melanocytic nevi of trunk: Secondary | ICD-10-CM | POA: Diagnosis not present

## 2021-12-26 DIAGNOSIS — C44319 Basal cell carcinoma of skin of other parts of face: Secondary | ICD-10-CM | POA: Diagnosis not present

## 2021-12-26 DIAGNOSIS — L814 Other melanin hyperpigmentation: Secondary | ICD-10-CM | POA: Diagnosis not present

## 2021-12-26 DIAGNOSIS — L300 Nummular dermatitis: Secondary | ICD-10-CM | POA: Diagnosis not present

## 2021-12-27 ENCOUNTER — Telehealth: Payer: Self-pay | Admitting: Gastroenterology

## 2021-12-27 NOTE — Telephone Encounter (Signed)
Sorry to hear this, thanks for letting me know 

## 2021-12-27 NOTE — Telephone Encounter (Signed)
Patient called to cancel colon/endo scheduled for 12/29/21.  She is running a fever of over 100, has a cough, and congestion.  She hasn't taken a COVID test as of yet, but is going to.  Her husband (care partner) is sick as well.  Just felt it best to reschedule at this time. Procedures rescheduled for 01/31/22 at 2:30 p.m.  Thank you.

## 2021-12-29 ENCOUNTER — Encounter: Payer: Medicare Other | Admitting: Gastroenterology

## 2021-12-30 ENCOUNTER — Other Ambulatory Visit: Payer: Self-pay | Admitting: Family Medicine

## 2021-12-30 ENCOUNTER — Telehealth: Payer: Self-pay | Admitting: Family Medicine

## 2021-12-30 MED ORDER — MOLNUPIRAVIR EUA 200MG CAPSULE: 4.0000 | ORAL_CAPSULE | Freq: Two times a day (BID) | ORAL | 0 refills | Status: AC

## 2021-12-30 NOTE — Telephone Encounter (Signed)
Pt called and tested covid + yesterday. She started presenting symptoms Tuesday. Her symptoms include cough, back pain, sore throat, and 102 fever. She was wondering if she could get covid medication as soon as possible. Pt does not feel like getting out of bed for an appointment. Please advise.   [place] [place] [facility] [place]  [address], [place] [address]  Phone:  [phone]  Fax:  [phone]

## 2022-01-02 ENCOUNTER — Ambulatory Visit: Payer: Medicare Other | Admitting: Family Medicine

## 2022-01-02 ENCOUNTER — Telehealth: Payer: Self-pay | Admitting: Cardiology

## 2022-01-02 NOTE — Telephone Encounter (Signed)
[employer] was calling to get a prior auth for the patient's Repatha.   They are requesting updates as to whether or not the patient has had clinical benefits with the requested medication.  Please call [phone] option 5

## 2022-01-02 NOTE — Telephone Encounter (Signed)
Pa submitted  [name] (Key: [id number]) Repatha SureClick 140MG /ML auto-injectors Status: PA Request

## 2022-01-02 NOTE — Telephone Encounter (Signed)
Routed to [place] for assistance with PA

## 2022-01-05 ENCOUNTER — Other Ambulatory Visit: Payer: Self-pay | Admitting: Cardiology

## 2022-01-05 DIAGNOSIS — I10 Essential (primary) hypertension: Secondary | ICD-10-CM

## 2022-01-09 ENCOUNTER — Telehealth: Payer: Self-pay | Admitting: Family Medicine

## 2022-01-09 NOTE — Telephone Encounter (Signed)
Pt states she is still having problems post covid. She stated she has been coughing up a red/brown color. Tried to triage her but she denied, so she was scheduled tomorrow virtually with Caleen Jobs. She would like to know if Dr. Charlett Blake could prescribe something or if she should just wait till tomorrow. Please advise if pt can get something called in.

## 2022-01-10 ENCOUNTER — Encounter: Payer: Self-pay | Admitting: Family Medicine

## 2022-01-10 ENCOUNTER — Telehealth (INDEPENDENT_AMBULATORY_CARE_PROVIDER_SITE_OTHER): Payer: Medicare Other | Admitting: Family Medicine

## 2022-01-10 VITALS — BP 115/75 | HR 71 | Temp 98.3°F | Wt 127.0 lb

## 2022-01-10 DIAGNOSIS — J988 Other specified respiratory disorders: Secondary | ICD-10-CM

## 2022-01-10 DIAGNOSIS — R062 Wheezing: Secondary | ICD-10-CM | POA: Diagnosis not present

## 2022-01-10 DIAGNOSIS — R052 Subacute cough: Secondary | ICD-10-CM | POA: Diagnosis not present

## 2022-01-10 MED ORDER — DOXYCYCLINE HYCLATE 100 MG PO TABS: 100.0000 mg | ORAL_TABLET | Freq: Two times a day (BID) | ORAL | 0 refills | Status: AC

## 2022-01-10 MED ORDER — FLUTICASONE PROPIONATE HFA 44 MCG/ACT IN AERO: 2.0000 | INHALATION_SPRAY | Freq: Two times a day (BID) | RESPIRATORY_TRACT | 12 refills | Status: DC

## 2022-01-10 NOTE — Patient Instructions (Signed)
Chest xray ordered - come by when you can and we will let you know when results are in.  Start antibiotics (doxycycline) - take with food and water Adding steroid inhaler. You can still use your albuterol inhaler as needed.  Try to monitor your oxygen level at home and let us know if starting to drop. If consistently less than 88%, you need to go to the hospital.  Continue supportive measures including rest, hydration, humidifier use, steam showers, warm compresses to sinuses, warm liquids with lemon and honey, and over-the-counter cough, cold, and analgesics as needed.

## 2022-01-10 NOTE — Progress Notes (Addendum)
Virtual Video Visit via MyChart Note  I connected with  [name] on 01/10/22 at  9:00 AM EST by the video enabled telemedicine application for MyChart, and verified that I am speaking with the correct person using two identifiers.   I introduced myself as a Designer, jewellery with the practice. We discussed the limitations of evaluation and management by telemedicine and the availability of in person appointments. The patient expressed understanding and agreed to proceed.  Participating parties in this visit include: The patient and the nurse practitioner listed.  The patient is: At home I am: In the office - [facility] at [facility]:    CC:  Chief Complaint  Patient presents with   Cough    "Looked like chocolate stuff coming up"    HPI: [name] is a 76 y.o. year old female presenting today via MyChart today for cough.  Patient reports she was COVID-positive about 4 weeks ago.  States infection started with upper chest congestion, coughing, diarrhea, fever, sore throat.  She took antivirals which seemed to help.  Reports cough persisted.  She has been getting some relief with DayQuil, NyQuil, Coricidin, throat lozenges.  Last Wednesday she was coughing/hacking and was able to get up a good amount of mucus that was mixed with " dark chocolate looking stuff" -she is estimating it may have been a couple tablespoons worth.  States she feels like she has a lot of mucus in her upper chest and has trouble getting it all the way up.  She has not had any more dark sputum since this episode.  However for the past 2 mornings she has had some pink-tinged to her brownish-yellow sputum.  Reports history of esophageal strictures.  Additionally cold symptoms have returned, rhinorrhea, nasal congestion, sinus pressure, fatigue.  States she is dyspneic on exertion but thinks she may be close to baseline.  She is having some occasional wheezing.  Her chest is sore from  coughing but otherwise no chest pain.  No other signs of bleeding.  No GI/GU symptoms, body aches, fevers. She has not yet had to use her albuterol inhaler. She has not been monitoring her oxygen levels at home.      Past medical history, Surgical history, Family history not pertinant except as noted below, Social history, Allergies, and medications have been entered into the medical record, reviewed, and corrections made.   Review of Systems:  All review of systems negative except what is listed in the HPI   Objective:    General:  Speaking clearly in complete sentences. Mild shortness of breath noted, but able to carry on conversation appropriately Alert and oriented x3.   Normal judgment.  Absent acute distress.   Impression and Recommendations:    1. Respiratory infection 2. Subacute cough 3. Wheezing Concern for secondary infection, potentially pneumonia and sinusitis.  Chest xray ordered - come by when you can and we will let you know when results are in.  Start antibiotics (doxycycline) - take with food and water Adding steroid inhaler. You can still use your albuterol inhaler as needed. - Patient prefers avoiding oral steroids if possible due to side effects. Will try ICS first. Try to monitor your oxygen level at home and let us know if starting to drop. If consistently less than 88%, you need to go to the hospital.  Continue supportive measures including rest, hydration, humidifier use, steam showers, warm compresses to sinuses, warm liquids with lemon and honey,  and over-the-counter cough, cold, and analgesics as needed.    - fluticasone (FLOVENT HFA) 44 MCG/ACT inhaler; Inhale 2 puffs into the lungs in the morning and at bedtime.  Dispense: 1 each; Refill: 12 - doxycycline (VIBRA-TABS) 100 MG tablet; Take 1 tablet (100 mg total) by mouth 2 (two) times daily for 10 days.  Dispense: 20 tablet; Refill: 0 - DG Chest 2 View; Future   Follow-up in 1 week or sooner if  symptoms worsen or fail to improve.     I discussed the assessment and treatment plan with the patient. The patient was provided an opportunity to ask questions and all were answered. The patient agreed with the plan and demonstrated an understanding of the instructions.   The patient was advised to call back or seek an in-person evaluation if the symptoms worsen or if the condition fails to improve as anticipated.  I spent 20 minutes dedicated to the care of this patient on the date of this encounter to include pre-visit chart review of prior notes and results, face-to-face time with the patient, and post-visit ordering of testing as indicated.   Terrilyn Saver, NP

## 2022-01-13 ENCOUNTER — Ambulatory Visit (HOSPITAL_BASED_OUTPATIENT_CLINIC_OR_DEPARTMENT_OTHER)
Admission: RE | Admit: 2022-01-13 | Discharge: 2022-01-13 | Disposition: A | Discharge status: Home / Self Care | Payer: Medicare Other | Source: Ambulatory Visit | Attending: Family Medicine | Admitting: Family Medicine

## 2022-01-13 ENCOUNTER — Other Ambulatory Visit: Payer: Self-pay

## 2022-01-13 DIAGNOSIS — R062 Wheezing: Secondary | ICD-10-CM | POA: Diagnosis not present

## 2022-01-13 DIAGNOSIS — R052 Subacute cough: Secondary | ICD-10-CM | POA: Insufficient documentation

## 2022-01-13 DIAGNOSIS — J988 Other specified respiratory disorders: Secondary | ICD-10-CM | POA: Insufficient documentation

## 2022-01-13 DIAGNOSIS — R059 Cough, unspecified: Secondary | ICD-10-CM | POA: Diagnosis not present

## 2022-01-17 ENCOUNTER — Encounter: Payer: Self-pay | Admitting: Family Medicine

## 2022-01-17 ENCOUNTER — Other Ambulatory Visit: Payer: Self-pay | Admitting: Cardiology

## 2022-01-17 ENCOUNTER — Ambulatory Visit (INDEPENDENT_AMBULATORY_CARE_PROVIDER_SITE_OTHER): Payer: Medicare Other | Admitting: Family Medicine

## 2022-01-17 VITALS — BP 111/58 | HR 80 | Temp 98.2°F | Resp 12 | Ht 66.0 in | Wt 132.2 lb

## 2022-01-17 DIAGNOSIS — J988 Other specified respiratory disorders: Secondary | ICD-10-CM

## 2022-01-17 MED ORDER — PREDNISONE 20 MG PO TABS: 20.0000 mg | ORAL_TABLET | Freq: Every day | ORAL | 0 refills | Status: AC

## 2022-01-17 MED ORDER — GUAIFENESIN ER 600 MG PO TB12: 1200.0000 mg | ORAL_TABLET | Freq: Two times a day (BID) | ORAL | 2 refills | Status: DC

## 2022-01-17 NOTE — Progress Notes (Signed)
Acute Office Visit  Subjective:    Patient ID: [name], female    DOB: [birth year], 76 y.o.   MRN: [id number]  Chief Complaint  Patient presents with   Follow-up    Has phlegm in throat when waking up.    HPI Patient is in today for cough follow-up.   01/10/22 virtual visit for cough with discolored sputum. She was 4 weeks post-COVID infection and continuing to have productive cough and dyspnea on exertion with occasional wheezing. She was started on doxycycline and Flovent inhaler (preferred to hold off on oral prednisone initially). CXR was negative.   Today she reports she is feeling much better, but does continue to have some lingering symptoms. She continues with a productive cough, though mostly just first thing in the morning. Sputum is now yellow, and will occasionally have some pink tinge if she is coughing a lot. She does have some areas of pinpoint tenderness on her chest and back from all of the coughing. Breathing has improved, but she still gets short of breath and wheezy with exertion - she reports that she would like to try the prednisone now. She still has a few days left of her antibiotic. She denies any other chest pain, fevers, nausea, vomiting, diarrhea, loss of taste/smell.     Past Medical History:  Diagnosis Date   Allergy    Anemia    Anxiety    past hx    Arthritis    Asthma    Atrial flutter ([place])    past history- not current   Breast cancer ([place])    CAD (coronary artery disease) CARDIOLOGIST--  DR Angelena Form   mild non-obstructive cad   Cancer ([place])    right   Cataract    bilaterally removed    Chronic constipation    Chronic kidney disease    interstitial cystitis   Concussion    x 3   COPD (chronic obstructive pulmonary disease) (HCC)    Depression    past hx    Family history of malignant hyperthermia    father had this   Fibromyalgia    Frequency of urination    GERD (gastroesophageal reflux disease)    H/O hiatal hernia     History of basal cell carcinoma excision    X2   History of breast cancer ONCOLOGIST-- DR Jana Hakim---  NO RECURRANCE   DX 07/2012;  LOW GRADE DCIS  ER+PR+  ----  S/P RIGHT LUMPECTOMY WITH NEGATIVE MARGINS/   RADIATION ENDED 11/2012   History of chronic bronchitis    History of colonic polyps    History of tachycardia    CONTROLLED  WITH ATENOLOL   Hyperlipidemia    Hypertension    Neuromuscular disorder (HCC)    fibromyalgia   Neuropathy    Osteoporosis 01/2019   T score -2.2 stable/improved from prior study   Pelvic pain    Personal history of radiation therapy    S/P radiation therapy 11/12/12 - 12/05/12   right Breast   Sepsis ([place]) 2014   from UTI    Sinus headache    Urgency of urination     Past Surgical History:  Procedure Laterality Date   26 HOUR [place] STUDY N/A 04/03/2016   Procedure: 24 HOUR [place] STUDY;  Surgeon: Manus Gunning, MD;  Location: [facility];  Service: Gastroenterology;  Laterality: N/A;   BREAST BIOPSY Right 08/23/2012   ADH   BREAST BIOPSY Right 10/11/2012   Ductal Carcinoma  BREAST EXCISIONAL BIOPSY Right 09/04/2013   benign   BREAST LUMPECTOMY Right 10-11-2012   W/ SLN BX   CARDIAC CATHETERIZATION  09-13-2007  DR Lia Foyer   WELL-PRESERVED LVF/  DIFFUSE SCATTERED CORONARY CALCIFACATION AND ATHEROSCLEROSIS WITHOUT OBSTRUCTION   CARDIAC CATHETERIZATION  08-04-2010  DR MCALHANY   NON-OBSTRUCTIVE CAD/  pLAD 40%/  oLAD 30%/  mLAD 30%/  pRCA 30%/  EF 60%   CARDIOVASCULAR STRESS TEST  06-18-2012  DR McALHANY   LOW RISK NUCLEAR STUDY/  SMALL FIXED AREA OF MODERATELY DECREASED UPTAKE IN ANTEROSEPTAL WALL WHICH MAY BE ARTIFACTUAL/  NO ISCHEMIA/  EF 68%   COLONOSCOPY  09-29-2010   CYSTOSCOPY     CYSTOSCOPY WITH HYDRODISTENSION AND BIOPSY N/A 03/06/2014   Procedure: CYSTOSCOPY/HYDRODISTENSION/ INSTILATION OF MARCAINE AND PYRIDIUM;  Surgeon: Ailene Rud, MD;  Location: [facility];  Service: Urology;  Laterality: N/A;   ESOPHAGEAL  MANOMETRY N/A 04/03/2016   Procedure: ESOPHAGEAL MANOMETRY (EM);  Surgeon: Manus Gunning, MD;  Location: [facility];  Service: Gastroenterology;  Laterality: N/A;   EXTRACORPOREAL SHOCK WAVE LITHOTRIPSY Left 02/06/2019   Procedure: EXTRACORPOREAL SHOCK WAVE LITHOTRIPSY (ESWL);  Surgeon: Kathie Rhodes, MD;  Location: [facility];  Service: Urology;  Laterality: Left;   NASAL SINUS SURGERY  1985   ORIF RIGHT ANKLE  FX  2006   POLYPECTOMY     REMOVAL VOCAL CORD CYST  FEB 2014   RIGHT BREAST BX  08-23-2012   RIGHT HAND SURGERY  X3  LAST ONE 2009   INCLUDES  ORIF RIGHT 5TH FINGER AND REVISION TWICE   SKIN CANCER EXCISION     TONSILLECTOMY AND ADENOIDECTOMY  AGE 13   TOTAL ABDOMINAL HYSTERECTOMY W/ BILATERAL SALPINGOOPHORECTOMY  1982   W/  APPENDECTOMY   TRANSTHORACIC ECHOCARDIOGRAM  06-24-2012   GRADE I DIASTOLIC DYSFUNCTION/  EF 55-60%/  MILD MR   UPPER GASTROINTESTINAL ENDOSCOPY      Family History  Problem Relation Age of Onset   Rectal cancer Mother    Colon cancer Mother    Pancreatic cancer Mother    Diabetes Mother    Breast cancer Mother 66   Breast cancer Maternal Aunt        breast   Birth defects Maternal Aunt    Irritable bowel syndrome Son    Heart disease Son        CAD, MV replacement   Allergic Disorder Daughter    Diabetes Daughter    Colon cancer Father    Colon polyps Father    Diabetes Father    Stroke Father    Heart disease Father        CHF   Hyperlipidemia Father    Hypertension Father    Arthritis Father    Breast cancer Paternal Aunt    Breast cancer Paternal Aunt    Arthritis Paternal Uncle    Allergic Disorder Daughter    Heart disease Cousin        CAD, had a blood clot after stenting   Esophageal cancer Neg Hx    Stomach cancer Neg Hx     Social History   Socioeconomic History   Marital status: Married    Spouse name: Jori Moll    Number of children: 3   Years of education: BA   Highest education level: Not on file  Occupational  History   Occupation: Retired Programmer, multimedia: RETIRED  Tobacco Use   Smoking status: Former    Packs/day: 1.00  Years: 15.00    Pack years: 15.00    Types: Cigarettes    Quit date: 05/27/2005    Years since quitting: 16.6   Smokeless tobacco: Never  Vaping Use   Vaping Use: Never used  Substance and Sexual Activity   Alcohol use: No    Alcohol/week: 0.0 standard drinks   Drug use: No   Sexual activity: Yes    Partners: Male    Comment: 1st intercourse 76 yo-Fewer than 5 partners  Other Topics Concern   Not on file  Social History Narrative   Lives with husband.   Caffeine use: 1/2 cup per day   Exercise-- 2days a week [employer],  Water aerobics, walking       Retired Marine scientist   No dietary restrictions, tries to maintain a heart healthy diet   Social Determinants of Radio broadcast assistant Strain: Not on file  Food Insecurity: Not on file  Transportation Needs: Not on file  Physical Activity: Not on file  Stress: Not on file  Social Connections: Not on file  Intimate Partner Violence: Not on file    Outpatient Medications Prior to Visit  Medication Sig Dispense Refill   acetaminophen (TYLENOL) 500 MG tablet Take 1,000 mg by mouth every 6 (six) hours as needed for mild pain or headache.      albuterol (VENTOLIN HFA) 108 (90 Base) MCG/ACT inhaler Inhale 2 puffs into the lungs every 4 (four) hours as needed. 1 each 0   aspirin EC 81 MG tablet Take 1 tablet (81 mg total) by mouth daily. 90 tablet 3   atenolol (TENORMIN) 50 MG tablet Take 1 tablet by mouth twice daily 180 tablet 0   Cyanocobalamin (VITAMIN B-12) 5000 MCG TBDP Take 1 tablet by mouth daily with breakfast.     denosumab (PROLIA) 60 MG/ML SOSY injection Inject 60 mg into the skin every 6 (six) months.     doxycycline (VIBRA-TABS) 100 MG tablet Take 1 tablet (100 mg total) by mouth 2 (two) times daily for 10 days. 20 tablet 0   DULoxetine (CYMBALTA) 60 MG capsule Take 2 capsules by mouth once daily 180 capsule 1    Estradiol 10 MCG TABS vaginal tablet Place 1 tablet (10 mcg total) vaginally 2 (two) times a week. 24 tablet 4   fluticasone (FLONASE) 50 MCG/ACT nasal spray Use 2 spray(s) in each nostril once daily 16 g 5   fluticasone (FLOVENT HFA) 44 MCG/ACT inhaler Inhale 2 puffs into the lungs in the morning and at bedtime. 1 each 12   furosemide (LASIX) 20 MG tablet Take 1 tablet (20 mg total) by mouth as needed for edema. 90 tablet 3   gabapentin (NEURONTIN) 800 MG tablet Take 1 tablet (800 mg total) by mouth 3 (three) times daily. 270 tablet 1   hydroxychloroquine (PLAQUENIL) 200 MG tablet 1 tablet with food or milk     losartan (COZAAR) 100 MG tablet TAKE 1 TABLET BY MOUTH ONCE DAILY . APPOINTMENT REQUIRED FOR FUTURE REFILLS 60 tablet 0   methylcellulose (CITRUCEL) oral powder Take as directed once daily     mupirocin ointment (BACTROBAN) 2 % Apply thin film twice daily. 22 g 0   nitroGLYCERIN (NITROSTAT) 0.4 MG SL tablet Place 1 tablet (0.4 mg total) under the tongue every 5 (five) minutes as needed for chest pain. 25 tablet 1   nystatin cream (MYCOSTATIN) Apply 1 application topically 2 (two) times daily. 30 g 2   pantoprazole (PROTONIX) 40 MG tablet Take 1  tablet (40 mg total) by mouth daily. 90 tablet 1   pentosan polysulfate (ELMIRON) 100 MG capsule Take 1 capsule (100 mg total) by mouth 3 (three) times daily. Reported on 01/05/2016 (Patient taking differently: Take 100 mg by mouth 3 (three) times daily with meals as needed. Reported on 01/05/2016) 90 capsule 11   polyethylene glycol (MIRALAX) 17 g packet Take 17 g by mouth daily. 14 each 0   REPATHA SURECLICK 158 MG/ML SOAJ INJECT 140 MG SUBCUTANEOUSLY EVERY 14 DAYS 2 mL 11   sucralfate (CARAFATE) 1 GM/10ML suspension Take 10 mLs (1 g total) by mouth 4 (four) times daily -  with meals and at bedtime. 420 mL 1   traZODone (DESYREL) 100 MG tablet TAKE 1 TABLET BY MOUTH AT BEDTIME 90 tablet 1   amLODipine (NORVASC) 10 MG tablet Take 1 tablet (10 mg  total) by mouth daily. 90 tablet 3   No facility-administered medications prior to visit.    Allergies  Allergen Reactions   Clindamycin Rash and Shortness Of Breath   Clindamycin Hcl Shortness Of Breath and Rash   Penicillins Anaphylaxis   Rosuvastatin Anaphylaxis   Baclofen Other (See Comments) and Rash   Lincomycin Other (See Comments)   Clindamycin Hcl     Other reaction(s): Shortness of Breath, Rash   Codeine Hives and Other (See Comments)    headache Other reaction(s): Headache, Nausea, headache   Erythromycin Hives   Erythromycin Base Other (See Comments)    other   Fluzone [Influenza Virus Vaccine] Other (See Comments)    Local reaction at the site   Haemophilus Influenzae Other (See Comments)    Local reaction at the site Local reaction at the site   Haemophilus Influenzae Vaccines Other (See Comments)    Local reaction at site   Latex Hives   Other Other (See Comments)    Local reaction at site   Pentazocine Other (See Comments)   Pentazocine Lactate Other (See Comments)    HALLUCINATION   Pneumococcal Vaccine Hives and Swelling    Other reaction(s): Hives, Shortness of Breath   Pneumococcal Vaccine Polyvalent Hives, Swelling and Other (See Comments)    REACTION: redness, swelling, and hives at injection site   Tamoxifen Nausea And Vomiting and Other (See Comments)    HEADACHE Other reaction(s): Headache, Nausea    Review of Systems All review of systems negative except what is listed in the HPI     Objective:    Physical Exam Vitals reviewed.  Constitutional:      Appearance: Normal appearance. She is normal weight.  HENT:     Head: Normocephalic and atraumatic.     Left Ear: Tympanic membrane normal.     Mouth/Throat:     Mouth: Mucous membranes are moist.     Pharynx: Oropharynx is clear. No oropharyngeal exudate or posterior oropharyngeal erythema.  Cardiovascular:     Rate and Rhythm: Normal rate and regular rhythm.  Pulmonary:     Effort:  Pulmonary effort is normal.     Breath sounds: Normal breath sounds. No wheezing, rhonchi or rales.  Musculoskeletal:     Cervical back: Normal range of motion and neck supple.  Skin:    General: Skin is warm and dry.  Neurological:     General: No focal deficit present.     Mental Status: She is alert and oriented to person, place, and time.  Psychiatric:        Mood and Affect: Mood normal.  Behavior: Behavior normal.        Thought Content: Thought content normal.        Judgment: Judgment normal.    BP (!) 111/58 (BP Location: Left Arm, Cuff Size: Normal)    Pulse 80    Temp 98.2 F (36.8 C) (Oral)    Resp 12    Ht _0  (1.676 m)    Wt 132 lb 3.2 oz (60 kg)    SpO2 100%    BMI 21.34 kg/m  Wt Readings from Last 3 Encounters:  01/17/22 132 lb 3.2 oz (60 kg)  01/10/22 127 lb (57.6 kg)  12/15/21 134 lb 4.8 oz (60.9 kg)    Health Maintenance Due  Topic Date Due   PAP SMEAR-Modifier  11/05/2020   COVID-19 Vaccine (5 - Booster for Pfizer series) 09/19/2021    There are no preventive care reminders to display for this patient.   Lab Results  Component Value Date   TSH 1.23 12/12/2021   Lab Results  Component Value Date   WBC 8.9 12/12/2021   HGB 12.5 12/12/2021   HCT 38.6 12/12/2021   MCV 92.7 12/12/2021   PLT 327.0 12/12/2021   Lab Results  Component Value Date   NA 140 12/12/2021   K 4.6 12/12/2021   CHLORIDE 102 04/20/2015   CO2 30 12/12/2021   GLUCOSE 100 (H) 12/12/2021   BUN 15 12/12/2021   CREATININE 1.14 12/12/2021   BILITOT 0.5 12/12/2021   ALKPHOS 119 (H) 12/12/2021   AST 19 12/12/2021   ALT 11 12/12/2021   PROT 6.6 12/12/2021   ALBUMIN 4.0 12/12/2021   CALCIUM 9.5 12/12/2021   ANIONGAP 12 (H) 04/20/2015   EGFR 49 (L) 04/20/2015   GFR 46.78 (L) 12/12/2021   Lab Results  Component Value Date   CHOL 177 12/12/2021   Lab Results  Component Value Date   HDL 46.10 12/12/2021   Lab Results  Component Value Date   LDLCALC 96 12/12/2021    Lab Results  Component Value Date   TRIG 173.0 (H) 12/12/2021   Lab Results  Component Value Date   CHOLHDL 4 12/12/2021   Lab Results  Component Value Date   HGBA1C 5.9 12/12/2021       Assessment & Plan:    1. Respiratory infection Starting to improve. Adding a short burst of steroids to see if this will help per patient request (states she tolerates fine if she takes with benadryl). Also starting Mucinex. Finish the antibiotics as prescribed. Continue supportive measures including rest, hydration, humidifier use, steam showers, warm compresses to sinuses, warm liquids with lemon and honey, and over-the-counter cough, cold, and analgesics as needed.  Patient aware of signs/symptoms requiring further/urgent evaluation.   - predniSONE (DELTASONE) 20 MG tablet; Take 1 tablet (20 mg total) by mouth daily with breakfast for 5 days.  Dispense: 5 tablet; Refill: 0 - guaiFENesin (MUCINEX) 600 MG 12 hr tablet; Take 2 tablets (1,200 mg total) by mouth 2 (two) times daily.  Dispense: 30 tablet; Refill: 2   Follow-up if symptoms worsen or fail to improve.   Terrilyn Saver, NP

## 2022-01-17 NOTE — Patient Instructions (Signed)
So nice to meet you in-person today! I'm glad to hear we are making progress. Let's try a short course of steroids and adding some Mucinex while you finish out your antibiotics. If not continuing to improve, please let us know. Continue supportive measures including rest, hydration, humidifier use, steam showers, warm compresses to sinuses, warm liquids with lemon and honey, and over-the-counter cough, cold, and analgesics as needed.

## 2022-01-19 DIAGNOSIS — N2581 Secondary hyperparathyroidism of renal origin: Secondary | ICD-10-CM | POA: Diagnosis not present

## 2022-01-19 DIAGNOSIS — I129 Hypertensive chronic kidney disease with stage 1 through stage 4 chronic kidney disease, or unspecified chronic kidney disease: Secondary | ICD-10-CM | POA: Diagnosis not present

## 2022-01-19 DIAGNOSIS — N183 Chronic kidney disease, stage 3 unspecified: Secondary | ICD-10-CM | POA: Diagnosis not present

## 2022-01-19 DIAGNOSIS — N189 Chronic kidney disease, unspecified: Secondary | ICD-10-CM | POA: Diagnosis not present

## 2022-01-19 DIAGNOSIS — D631 Anemia in chronic kidney disease: Secondary | ICD-10-CM | POA: Diagnosis not present

## 2022-01-23 ENCOUNTER — Other Ambulatory Visit: Payer: Self-pay | Admitting: Nephrology

## 2022-01-23 DIAGNOSIS — N183 Chronic kidney disease, stage 3 unspecified: Secondary | ICD-10-CM

## 2022-01-24 ENCOUNTER — Other Ambulatory Visit: Payer: Self-pay | Admitting: Family Medicine

## 2022-01-25 ENCOUNTER — Telehealth: Payer: Self-pay

## 2022-01-25 NOTE — Telephone Encounter (Signed)
-----   Message from Yetta Flock, MD sent at 01/25/2022 10:12 AM EST ----- Got it Jenny Reichmann, thanks for letting me know.  Nance Mccombs can you please reach out to Ms. Bramhall and let her know that her procedure needs to be delayed by 6 weeks given her recent URI / history of asthma. Can you help reschedule her and take her off the schedule for 2/7? Thanks  Dr. Loni Muse ----- Message ----- From: Osvaldo Angst, CRNA Sent: 01/25/2022   5:57 AM EST To: Yetta Flock, MD  Dr. Havery Moros,  This pt is scheduled with you on 2/7.  She is an asthmatic and is currently being tx'd for a URI.  Her procedure will need to be delayed until six weeks after the tx for this illness is completed.  Thanks,  Osvaldo Angst

## 2022-01-25 NOTE — Telephone Encounter (Signed)
Called and spoke with patient regarding her upcoming procedures on 2/7. Pt is aware that we need to delay her procedure for 6-weeks due to URI and hx of asthma. Pt was a little disappointed but she understands why we need to delay the procedures. Her EGD/colon has been rescheduled to Wednesday, 03/22/22 at 2:30 pm. She is aware that she will need to arrive by 1:30 pm with a care partner. Pt knows that she can call in the interim if she has any worsening symptoms. She will seek ED evaluation if she notices significant bleeding and worsening symptoms. Pt verbalized understanding and had no concerns at the end of the call.

## 2022-01-27 ENCOUNTER — Telehealth: Payer: Self-pay | Admitting: Cardiology

## 2022-01-27 NOTE — Telephone Encounter (Signed)
New Message:     Hala from [facility] called. She wanted you to know that the patient's Repatha have been approved.

## 2022-01-30 ENCOUNTER — Ambulatory Visit
Admission: RE | Admit: 2022-01-30 | Discharge: 2022-01-30 | Disposition: A | Discharge status: Home / Self Care | Payer: Medicare Other | Source: Ambulatory Visit | Attending: Nephrology | Admitting: Nephrology

## 2022-01-30 DIAGNOSIS — N281 Cyst of kidney, acquired: Secondary | ICD-10-CM | POA: Diagnosis not present

## 2022-01-30 DIAGNOSIS — N183 Chronic kidney disease, stage 3 unspecified: Secondary | ICD-10-CM

## 2022-01-30 DIAGNOSIS — N3289 Other specified disorders of bladder: Secondary | ICD-10-CM | POA: Diagnosis not present

## 2022-01-31 ENCOUNTER — Encounter: Payer: Medicare Other | Admitting: Gastroenterology

## 2022-02-14 ENCOUNTER — Encounter: Payer: Self-pay | Admitting: Obstetrics & Gynecology

## 2022-02-14 ENCOUNTER — Other Ambulatory Visit: Payer: Self-pay

## 2022-02-14 ENCOUNTER — Ambulatory Visit: Payer: Medicare Other | Admitting: Obstetrics & Gynecology

## 2022-02-14 VITALS — BP 112/64 | HR 70

## 2022-02-14 DIAGNOSIS — K591 Functional diarrhea: Secondary | ICD-10-CM

## 2022-02-14 DIAGNOSIS — N898 Other specified noninflammatory disorders of vagina: Secondary | ICD-10-CM | POA: Diagnosis not present

## 2022-02-14 LAB — WET PREP FOR TRICH, YEAST, CLUE

## 2022-02-14 MED ORDER — TINIDAZOLE 500 MG PO TABS: 1000.0000 mg | ORAL_TABLET | Freq: Two times a day (BID) | ORAL | 1 refills | Status: AC

## 2022-02-14 NOTE — Progress Notes (Signed)
° ° °  [name] Nov 27, 1946 [id number]        76 y.o.  G3P3L3  RP: Vaginal odor intermittently  HPI: C/O of intermittent vaginal odors.  No vaginal bleeding.  IBS with diarrhea currently. Urine normal. No pelvic pain.  No fever.   OB History  Gravida Para Term Preterm AB Living  3 3       3   SAB IAB Ectopic Multiple Live Births               # Outcome Date GA Lbr Len/2nd Weight Sex Delivery Anes PTL Lv  3 Para           2 Para           1 Para             Past medical history,surgical history, problem list, medications, allergies, family history and social history were all reviewed and documented in the EPIC chart.   Directed ROS with pertinent positives and negatives documented in the history of present illness/assessment and plan.  Exam:  Vitals:   02/14/22 1012  BP: 112/64  Pulse: 70   General appearance:  Normal  Gynecologic exam: Vulva normal.  Wet prep done in the vagina.  Wet prep:  Many bacteria.   Assessment/Plan:  76 y.o. G3P3   1. Vaginal odor C/O of intermittent vaginal odors.  No vaginal bleeding.  IBS with diarrhea currently. Urine normal. No pelvic pain.  No fever. Many bacteria on the Wet prep.  Will treat with Tinidazole.  Usage reviewed and prescription sent to pharmacy. - WET PREP FOR [place], YEAST, CLUE  2. Functional diarrhea Possible IBS.  Will f/u with her Gastro.  Other orders - gabapentin (NEURONTIN) 300 MG capsule; Take by mouth. - tinidazole (TINDAMAX) 500 MG tablet; Take 2 tablets (1,000 mg total) by mouth 2 (two) times daily for 2 days.   Princess Bruins MD, 10:32 AM 02/14/2022

## 2022-02-16 ENCOUNTER — Other Ambulatory Visit: Payer: Self-pay | Admitting: Family Medicine

## 2022-02-23 DIAGNOSIS — L570 Actinic keratosis: Secondary | ICD-10-CM | POA: Diagnosis not present

## 2022-02-24 NOTE — Progress Notes (Signed)
Subjective:   [name] is a 76 y.o. female who presents for Medicare Annual (Subsequent) preventive examination.  I connected with  [name] on 02/28/2022 by a audio enabled telemedicine application and verified that I am speaking with the correct person using two identifiers.  Patient Location: Home  Provider Location: Office/Clinic  I discussed the limitations of evaluation and management by telemedicine. The patient expressed understanding and agreed to proceed.   Review of Systems     Cardiac Risk Factors include: advanced age (>42men, >63 women);hypertension;dyslipidemia     Objective:    Today's Vitals   02/28/22 0954  PainSc: 7    There is no height or weight on file to calculate BMI.  Advanced Directives 02/28/2022 05/11/2020 03/19/2020 08/28/2019 02/06/2019 08/28/2018 02/25/2018  Does Patient Have a Medical Advance Directive? Yes Yes Yes Yes Yes Yes Yes  Type of Paramedic of [place];Living will;Out of facility DNR (pink MOST or yellow [name]) [place];Living will [place];Living will [place];Living will [place];Living will [place];Living will [place]  Does patient want to make changes to medical advance directive? - No - Patient declined No - Patient declined - No - Patient declined - No - Patient declined  Copy of [place] in Chart? No - copy requested Yes - validated most recent copy scanned in chart (See row information) No - copy requested - No - copy requested - Yes    Current Medications (verified) Outpatient Encounter Medications as of 02/28/2022  Medication Sig   acetaminophen (TYLENOL) 500 MG tablet Take 1,000 mg by mouth every 6 (six) hours as needed for mild pain or headache.    albuterol (VENTOLIN HFA) 108 (90 Base) MCG/ACT inhaler Inhale 2 puffs into the lungs every 4 (four) hours as needed.    amLODipine (NORVASC) 10 MG tablet Take 1 tablet by mouth once daily   aspirin EC 81 MG tablet Take 1 tablet (81 mg total) by mouth daily.   atenolol (TENORMIN) 50 MG tablet Take 1 tablet by mouth twice daily   Cyanocobalamin (VITAMIN B-12) 5000 MCG TBDP Take 1 tablet by mouth daily with breakfast.   denosumab (PROLIA) 60 MG/ML SOSY injection Inject 60 mg into the skin every 6 (six) months.   DULoxetine (CYMBALTA) 60 MG capsule Take 2 capsules by mouth once daily   Estradiol 10 MCG TABS vaginal tablet Place 1 tablet (10 mcg total) vaginally 2 (two) times a week.   fluticasone (FLONASE) 50 MCG/ACT nasal spray Use 2 spray(s) in each nostril once daily   furosemide (LASIX) 20 MG tablet Take 1 tablet (20 mg total) by mouth as needed for edema.   gabapentin (NEURONTIN) 300 MG capsule Take by mouth.   hydroxychloroquine (PLAQUENIL) 200 MG tablet 1 tablet with food or milk   losartan (COZAAR) 100 MG tablet TAKE 1 TABLET BY MOUTH ONCE DAILY . APPOINTMENT REQUIRED FOR FUTURE REFILLS   mupirocin ointment (BACTROBAN) 2 % Apply thin film twice daily.   nitroGLYCERIN (NITROSTAT) 0.4 MG SL tablet Place 1 tablet (0.4 mg total) under the tongue every 5 (five) minutes as needed for chest pain.   nystatin cream (MYCOSTATIN) Apply 1 application topically 2 (two) times daily.   pantoprazole (PROTONIX) 40 MG tablet Take 1 tablet by mouth once daily   pentosan polysulfate (ELMIRON) 100 MG capsule Take 1 capsule (100 mg total) by mouth 3 (three) times daily.  Reported on 01/05/2016 (Patient taking differently: Take 100 mg by mouth 3 (three) times daily with meals as needed. Reported on 01/05/2016)   polyethylene glycol (MIRALAX) 17 g packet Take 17 g by mouth daily.   REPATHA SURECLICK 694 MG/ML SOAJ INJECT 140 MG SUBCUTANEOUSLY EVERY 14 DAYS   sucralfate (CARAFATE) 1 GM/10ML suspension Take 10 mLs (1 g total) by mouth 4 (four) times daily -  with meals and at bedtime.   traZODone (DESYREL) 100 MG tablet TAKE 1 TABLET BY  MOUTH AT BEDTIME   [DISCONTINUED] guaiFENesin (MUCINEX) 600 MG 12 hr tablet Take 2 tablets (1,200 mg total) by mouth 2 (two) times daily.   No facility-administered encounter medications on file as of 02/28/2022.    Allergies (verified) Clindamycin, Clindamycin hcl, Penicillins, Rosuvastatin, Baclofen, Lincomycin, Prednisone, Clindamycin hcl, Codeine, Erythromycin, Erythromycin base, Fluzone [influenza virus vaccine], Haemophilus influenzae, Haemophilus influenzae vaccines, Latex, Levofloxacin, Other, Pentazocine, Pentazocine lactate, Pneumococcal vaccine, Pneumococcal vaccine polyvalent, and Tamoxifen   History: Past Medical History:  Diagnosis Date   Allergy    Anemia    Anxiety    past hx    Arthritis    Asthma    Atrial flutter (HCC)    past history- not current   Breast cancer ([place])    CAD (coronary artery disease) CARDIOLOGIST--  [name]   mild non-obstructive cad   Cancer ([place])    right   Cataract    bilaterally removed    Chronic constipation    Chronic kidney disease    stage 3 ckd, interstitial cystitis   Concussion    x 3   COPD (chronic obstructive pulmonary disease) ([place])    Depression    past hx    Family history of malignant hyperthermia    father had this   Fibromyalgia    Frequency of urination    GERD (gastroesophageal reflux disease)    H/O hiatal hernia    History of basal cell carcinoma excision    X2   History of breast cancer ONCOLOGIST-- [name]---  NO RECURRANCE   DX 07/2012;  LOW GRADE DCIS  ER+PR+  ----  S/P RIGHT LUMPECTOMY WITH NEGATIVE MARGINS/   RADIATION ENDED 11/2012   History of chronic bronchitis    History of colonic polyps    History of tachycardia    CONTROLLED  WITH ATENOLOL   Hyperlipidemia    Hypertension    Neuromuscular disorder (HCC)    fibromyalgia   Neuropathy    Osteoporosis 01/2019   T score -2.2 stable/improved from prior study   Pelvic pain    Personal history of radiation therapy    S/P radiation therapy  11/12/12 - 12/05/12   right Breast   Sepsis ([place]) 2014   from UTI    Sinus headache    Sjogren's disease ([place])    Urgency of urination    Past Surgical History:  Procedure Laterality Date   55 HOUR [place] STUDY N/A 04/03/2016   Procedure: 24 HOUR [place] STUDY;  Surgeon: Sheri Gunning, MD;  Location: [facility];  Service: Gastroenterology;  Laterality: N/A;   BREAST BIOPSY Right 08/23/2012   ADH   BREAST BIOPSY Right 10/11/2012   Ductal Carcinoma   BREAST EXCISIONAL BIOPSY Right 09/04/2013   benign   BREAST LUMPECTOMY Right 10-11-2012   W/ SLN BX   CARDIAC CATHETERIZATION  09-13-2007  Sheri Lia Foyer   WELL-PRESERVED LVF/  DIFFUSE SCATTERED CORONARY CALCIFACATION AND ATHEROSCLEROSIS WITHOUT OBSTRUCTION   CARDIAC CATHETERIZATION  08-04-2010  Sheri  MCALHANY   NON-OBSTRUCTIVE CAD/  pLAD 40%/  oLAD 30%/  mLAD 30%/  pRCA 30%/  EF 60%   CARDIOVASCULAR STRESS TEST  06-18-2012  Sheri McALHANY   LOW RISK NUCLEAR STUDY/  SMALL FIXED AREA OF MODERATELY DECREASED UPTAKE IN ANTEROSEPTAL WALL WHICH MAY BE ARTIFACTUAL/  NO ISCHEMIA/  EF 68%   COLONOSCOPY  09-29-2010   CYSTOSCOPY     CYSTOSCOPY WITH HYDRODISTENSION AND BIOPSY N/A 03/06/2014   Procedure: CYSTOSCOPY/HYDRODISTENSION/ INSTILATION OF MARCAINE AND PYRIDIUM;  Surgeon: Ailene Rud, MD;  Location: [facility];  Service: Urology;  Laterality: N/A;   ESOPHAGEAL MANOMETRY N/A 04/03/2016   Procedure: ESOPHAGEAL MANOMETRY (EM);  Surgeon: Sheri Gunning, MD;  Location: [facility];  Service: Gastroenterology;  Laterality: N/A;   EXTRACORPOREAL SHOCK WAVE LITHOTRIPSY Left 02/06/2019   Procedure: EXTRACORPOREAL SHOCK WAVE LITHOTRIPSY (ESWL);  Surgeon: Kathie Rhodes, MD;  Location: [facility];  Service: Urology;  Laterality: Left;   NASAL SINUS SURGERY  1985   ORIF RIGHT ANKLE  FX  2006   POLYPECTOMY     REMOVAL VOCAL CORD CYST  FEB 2014   RIGHT BREAST BX  08-23-2012   RIGHT HAND SURGERY  X3  LAST ONE 2009   INCLUDES  ORIF  RIGHT 5TH FINGER AND REVISION TWICE   SKIN CANCER EXCISION     TONSILLECTOMY AND ADENOIDECTOMY  AGE 22   TOTAL ABDOMINAL HYSTERECTOMY W/ BILATERAL SALPINGOOPHORECTOMY  1982   W/  APPENDECTOMY   TRANSTHORACIC ECHOCARDIOGRAM  06-24-2012   GRADE I DIASTOLIC DYSFUNCTION/  EF 55-60%/  MILD MR   UPPER GASTROINTESTINAL ENDOSCOPY     Family History  Problem Relation Age of Onset   Rectal cancer Mother    Colon cancer Mother    Pancreatic cancer Mother    Diabetes Mother    Breast cancer Mother 30   Breast cancer Maternal Aunt        breast   Birth defects Maternal Aunt    Irritable bowel syndrome Son    Heart disease Son        CAD, MV replacement   Allergic Disorder Daughter    Diabetes Daughter    Colon cancer Father    Colon polyps Father    Diabetes Father    Stroke Father    Heart disease Father        CHF   Hyperlipidemia Father    Hypertension Father    Arthritis Father    Breast cancer Paternal Aunt    Breast cancer Paternal Aunt    Arthritis Paternal Uncle    Allergic Disorder Daughter    Heart disease Cousin        CAD, had a blood clot after stenting   Esophageal cancer Neg Hx    Stomach cancer Neg Hx    Social History   Socioeconomic History   Marital status: Married    Spouse name: Jori Moll    Number of children: 3   Years of education: BA   Highest education level: Not on file  Occupational History   Occupation: Retired Programmer, multimedia: RETIRED  Tobacco Use   Smoking status: Former    Packs/day: 1.00    Years: 15.00    Pack years: 15.00    Types: Cigarettes    Quit date: 05/27/2005    Years since quitting: 16.7   Smokeless tobacco: Never  Vaping Use   Vaping Use: Never used  Substance and Sexual Activity   Alcohol use: No  Alcohol/week: 0.0 standard drinks   Drug use: No   Sexual activity: Not Currently    Partners: Male    Birth control/protection: Surgical    Comment: 1st intercourse 76 yo-Fewer than 5 partners, hysterectomy  Other Topics  Concern   Not on file  Social History Narrative   Lives with husband.   Caffeine use: 1/2 cup per day   Exercise-- 2days a week [employer],  Water aerobics, walking       Retired Marine scientist   No dietary restrictions, tries to maintain a heart healthy diet   Social Determinants of Radio broadcast assistant Strain: Low Risk    Difficulty of Paying Living Expenses: Not hard at all  Food Insecurity: No Food Insecurity   Worried About Charity fundraiser in the Last Year: Never true   Arboriculturist in the Last Year: Never true  Transportation Needs: No Transportation Needs   Lack of Transportation (Medical): No   Lack of Transportation (Non-Medical): No  Physical Activity: Not on file  Stress: Not on file  Social Connections: Socially Integrated   Frequency of Communication with Friends and Family: More than three times a week   Frequency of Social Gatherings with Friends and Family: More than three times a week   Attends Religious Services: More than 4 times per year   Active Member of [employer] or Organizations: Yes   Attends Music therapist: More than 4 times per year   Marital Status: Married    Tobacco Counseling Counseling given: Not Answered   Clinical Intake:  Pre-visit preparation completed: Yes  Pain : 0-10 Pain Score: 7  Pain Type: Chronic pain Pain Location: Other (Comment) Pain Orientation: Other (Comment) Pain Descriptors / Indicators: Sore Pain Onset: More than a month ago Pain Frequency: Several days a week     Nutritional Risks: None Diabetes: No  How often do you need to have someone help you when you read instructions, pamphlets, or other written materials from your doctor or pharmacy?: 1 - Never  Diabetic?No  Interpreter Needed?: No  Information entered by :: [place] of Daily Living In your present state of health, do you have any difficulty performing the following activities: 02/28/2022 05/17/2021  Hearing? N N   Vision? N N  Difficulty concentrating or making decisions? N N  Walking or climbing stairs? N N  Dressing or bathing? N N  Doing errands, shopping? N N  Preparing Food and eating ? N -  Using the Toilet? N -  In the past six months, have you accidently leaked urine? Y -  Do you have problems with loss of bowel control? Y -  Managing your Medications? N -  Managing your Finances? N -  Housekeeping or managing your Housekeeping? N -  Some recent data might be hidden    Patient Care Team: Mosie Lukes, MD as PCP - General (Family Medicine) Druscilla Brownie, MD as Consulting Physician (Dermatology) Collene Gobble, MD as Consulting Physician (Pulmonary Disease) Magrinat, Virgie Dad, MD (Inactive) as Consulting Physician (Oncology) Lelon Perla, MD as Consulting Physician (Cardiology) Armbruster, Carlota Raspberry, MD as Consulting Physician (Gastroenterology) Suella Broad, MD as Consulting Physician (Physical Medicine and Rehabilitation) Fontaine, Belinda Block, MD (Inactive) as Consulting Physician (Gynecology) Jerrell Belfast, MD as Consulting Physician (Otolaryngology) Paralee Cancel, MD as Consulting Physician (Orthopedic Surgery) Kathie Rhodes, MD (Inactive) as Consulting Physician (Urology)  Indicate any recent Medical Services you may have received from other  than [facility] providers in the past year (date may be approximate).     Assessment:   This is a routine wellness examination for [name].  Hearing/Vision screen No results found.  Dietary issues and exercise activities discussed: Current Exercise Habits: Home exercise routine, Type of exercise: walking, Time (Minutes): 60, Frequency (Times/Week): 7, Weekly Exercise (Minutes/Week): 420, Intensity: Mild, Exercise limited by: None identified   Goals Addressed   None    Depression Screen PHQ 2/9 Scores 02/28/2022 05/17/2021 03/08/2021 07/16/2014 12/29/2013  PHQ - 2 Score 0 0 6 0 6  PHQ- 9 Score - - 17 - 14    Fall Risk Fall  Risk  02/28/2022 05/17/2021 03/19/2020 09/25/2019 07/16/2014  Falls in the past year? 0 0 0 1 No  Number falls in past yr: 0 0 0 0 -  Injury with Fall? 0 0 0 0 -  Risk for fall due to : No Fall Risks - - - -  Follow up Falls evaluation completed - Education provided;Falls prevention discussed - -    FALL RISK PREVENTION PERTAINING TO THE HOME:  Any stairs in or around the home? Yes  If so, are there any without handrails? No Home free of loose throw rugs in walkways, pet beds, electrical cords, etc? Yes  Adequate lighting in your home to reduce risk of falls? Yes   ASSISTIVE DEVICES UTILIZED TO PREVENT FALLS:  Life alert? No  Use of a cane, walker or w/c? Yes  Grab bars in the bathroom? Yes  Shower chair or bench in shower? Yes  Elevated toilet seat or a handicapped toilet? Yes   TIMED UP AND GO:  Was the test performed? No , phone visit      Cognitive Function:   MMSE - Mini Mental State Exam 07/26/2016  Orientation to time 4  Orientation to Place 4  Registration 3  Attention/ Calculation 5  Recall 2  Language- name 2 objects 2  Language- repeat 1  Language- follow 3 step command 2  Language- read & follow direction 1  Write a sentence 1  Copy design 0  Total score 25     6CIT Screen 02/28/2022  What Year? 0 points  What month? 0 points  What time? 0 points  Count back from 20 0 points  Months in reverse 0 points  Repeat phrase 0 points  Total Score 0    Immunizations Immunization History  Administered Date(s) Administered   Influenza Split 10/10/2011, 09/24/2012   Influenza Whole 10/18/2010   Influenza,inj,Quad PF,6+ Mos 10/13/2013, 09/25/2014   PFIZER(Purple Top)SARS-COV-2 Vaccination 01/29/2020, 02/23/2020, 09/18/2020, 07/25/2021   Pneumococcal Polysaccharide-23 01/27/2011   Tdap 10/21/2013, 06/01/2018   Zoster Recombinat (Shingrix) 07/14/2019, 10/16/2019    TDAP status: Up to date  Flu Vaccine status: Due, Education has been provided regarding the  importance of this vaccine. Advised may receive this vaccine at local pharmacy or Health Dept. Aware to provide a copy of the vaccination record if obtained from local pharmacy or Health Dept. Verbalized acceptance and understanding.  Pneumococcal vaccine status: Due, Education has been provided regarding the importance of this vaccine. Advised may receive this vaccine at local pharmacy or Health Dept. Aware to provide a copy of the vaccination record if obtained from local pharmacy or Health Dept. Verbalized acceptance and understanding.  Covid-19 vaccine status: Declined, Education has been provided regarding the importance of this vaccine but patient still declined. Advised may receive this vaccine at local pharmacy or Health Dept.or vaccine clinic. Aware to provide  a copy of the vaccination record if obtained from local pharmacy or Health Dept. Verbalized acceptance and understanding.  Qualifies for Shingles Vaccine? Yes   Zostavax completed No   Shingrix Completed?: Yes  Screening Tests Health Maintenance  Topic Date Due   PAP SMEAR-Modifier  11/05/2020   COVID-19 Vaccine (5 - Booster for Pfizer series) 09/19/2021   MAMMOGRAM  04/29/2022   HEMOGLOBIN A1C  06/12/2022   COLONOSCOPY (Pts 45-53yrs Insurance coverage will need to be confirmed)  12/25/2023   TETANUS/TDAP  06/01/2028   DEXA SCAN  Completed   Hepatitis C Screening  Completed   Zoster Vaccines- Shingrix  Completed   HPV VACCINES  Aged Out   Pneumonia Vaccine 68+ Years old  Discontinued   INFLUENZA VACCINE  Discontinued   FOOT EXAM  Discontinued   [place]  Discontinued    Health Maintenance  Health Maintenance Due  Topic Date Due   PAP SMEAR-Modifier  11/05/2020   COVID-19 Vaccine (5 - Booster for [place] series) 09/19/2021    Colorectal cancer screening: Type of screening: Colonoscopy. Completed 12/24/2018. Repeat every 5 years  Mammogram status: Completed 04/29/21. Repeat every year  Bone Density status:  Completed 04/06/21. Results reflect: Bone density results: OSTEOPOROSIS. Repeat every 2 years.  Lung Cancer Screening: (Low Dose CT Chest recommended if Age 35-80 years, 30 pack-year currently smoking OR have quit w/in 15years.) does not qualify.   Lung Cancer Screening Referral: N/A  Additional Screening:  Hepatitis C Screening: does qualify; Completed 07/15/2019  Vision Screening: Recommended annual ophthalmology exams for early detection of glaucoma and other disorders of the eye. Is the patient up to date with their annual eye exam?  Yes  Who is the provider or what is the name of the office in which the patient attends annual eye exams? [facility] If pt is not established with a provider, would they like to be referred to a provider to establish care? No .   Dental Screening: Recommended annual dental exams for proper oral hygiene  Community Resource Referral / Chronic Care Management: CRR required this visit?  No   CCM required this visit?  No      Plan:     I have personally reviewed and noted the following in the patients chart:   Medical and social history Use of alcohol, tobacco or illicit drugs  Current medications and supplements including opioid prescriptions.  Functional ability and status Nutritional status Physical activity Advanced directives List of other physicians Hospitalizations, surgeries, and ER visits in previous 12 months Vitals Screenings to include cognitive, depression, and falls Referrals and appointments  In addition, I have reviewed and discussed with patient certain preventive protocols, quality metrics, and best practice recommendations. A written personalized care plan for preventive services as well as general preventive health recommendations were provided to patient.   Due to this being a telephonic visit, the after visit summary with patients personalized plan was offered to patient via mail or my-chart. Patient would like to access  on my-chart  Nicholaus Corolla, [place]   02/28/2022   Nurse Notes: None

## 2022-02-28 ENCOUNTER — Ambulatory Visit (INDEPENDENT_AMBULATORY_CARE_PROVIDER_SITE_OTHER): Payer: Medicare Other

## 2022-02-28 DIAGNOSIS — Z0000 Encounter for general adult medical examination without abnormal findings: Secondary | ICD-10-CM

## 2022-02-28 NOTE — Patient Instructions (Signed)
[name] , Thank you for taking time to come for your Medicare Wellness Visit. I appreciate your ongoing commitment to your health goals. Please review the following plan we discussed and let me know if I can assist you in the future.   Screening recommendations/referrals: Colonoscopy: 12/24/18 due 12/25/23 Mammogram: 04/29/21 due 04/29/22 Bone Density: 04/06/21 due 04/07/23 Recommended yearly ophthalmology/optometry visit for glaucoma screening and checkup Recommended yearly dental visit for hygiene and checkup  Vaccinations: Influenza vaccine: Due-May obtain vaccine at your local pharmacy.  Pneumococcal vaccine: Due-May obtain vaccine at your local pharmacy.  Tdap vaccine: up to date Shingles vaccine: up to date   Covid-19:Due-May obtain vaccine at your local pharmacy.   Advanced directives: Yes, I will bring at next visit  Conditions/risks identified: see problem list  Next appointment: Follow up in one year for your annual wellness visit    Preventive Care 77 Years and Older, Female Preventive care refers to lifestyle choices and visits with your health care provider that can promote health and wellness. What does preventive care include? A yearly physical exam. This is also called an annual well check. Dental exams once or twice a year. Routine eye exams. Ask your health care provider how often you should have your eyes checked. Personal lifestyle choices, including: Daily care of your teeth and gums. Regular physical activity. Eating a healthy diet. Avoiding tobacco and drug use. Limiting alcohol use. Practicing safe sex. Taking low-dose aspirin every day. Taking vitamin and mineral supplements as recommended by your health care provider. What happens during an annual well check? The services and screenings done by your health care provider during your annual well check will depend on your age, overall health, lifestyle risk factors, and family history of  disease. Counseling  Your health care provider may ask you questions about your: Alcohol use. Tobacco use. Drug use. Emotional well-being. Home and relationship well-being. Sexual activity. Eating habits. History of falls. Memory and ability to understand (cognition). Work and work Statistician. Reproductive health. Screening  You may have the following tests or measurements: Height, weight, and BMI. Blood pressure. Lipid and cholesterol levels. These may be checked every 5 years, or more frequently if you are over 30 years old. Skin check. Lung cancer screening. You may have this screening every year starting at age 5 if you have a 30-pack-year history of smoking and currently smoke or have quit within the past 15 years. Fecal occult blood test (FOBT) of the stool. You may have this test every year starting at age 25. Flexible sigmoidoscopy or colonoscopy. You may have a sigmoidoscopy every 5 years or a colonoscopy every 10 years starting at age 43. Hepatitis C blood test. Hepatitis B blood test. Sexually transmitted disease (STD) testing. Diabetes screening. This is done by checking your blood sugar (glucose) after you have not eaten for a while (fasting). You may have this done every 1-3 years. Bone density scan. This is done to screen for osteoporosis. You may have this done starting at age 53. Mammogram. This may be done every 1-2 years. Talk to your health care provider about how often you should have regular mammograms. Talk with your health care provider about your test results, treatment options, and if necessary, the need for more tests. Vaccines  Your health care provider may recommend certain vaccines, such as: Influenza vaccine. This is recommended every year. Tetanus, diphtheria, and acellular pertussis (Tdap, Td) vaccine. You may need a Td booster every 10 years. Zoster vaccine. You may need  this after age 76. Pneumococcal 13-valent conjugate (PCV13) vaccine. One  dose is recommended after age 48. Pneumococcal polysaccharide (PPSV23) vaccine. One dose is recommended after age 35. Talk to your health care provider about which screenings and vaccines you need and how often you need them. This information is not intended to replace advice given to you by your health care provider. Make sure you discuss any questions you have with your health care provider. Document Released: 01/07/2016 Document Revised: 08/30/2016 Document Reviewed: 10/12/2015 Elsevier Interactive Patient Education  2017 [employer] Prevention in the Home Falls can cause injuries. They can happen to people of all ages. There are many things you can do to make your home safe and to help prevent falls. What can I do on the outside of my home? Regularly fix the edges of walkways and driveways and fix any cracks. Remove anything that might make you trip as you walk through a door, such as a raised step or threshold. Trim any bushes or trees on the path to your home. Use bright outdoor lighting. Clear any walking paths of anything that might make someone trip, such as rocks or tools. Regularly check to see if handrails are loose or broken. Make sure that both sides of any steps have handrails. Any raised decks and porches should have guardrails on the edges. Have any leaves, snow, or ice cleared regularly. Use sand or salt on walking paths during winter. Clean up any spills in your garage right away. This includes oil or grease spills. What can I do in the bathroom? Use night lights. Install grab bars by the toilet and in the tub and shower. Do not use towel bars as grab bars. Use non-skid mats or decals in the tub or shower. If you need to sit down in the shower, use a plastic, non-slip stool. Keep the floor dry. Clean up any water that spills on the floor as soon as it happens. Remove soap buildup in the tub or shower regularly. Attach bath mats securely with double-sided  non-slip rug tape. Do not have throw rugs and other things on the floor that can make you trip. What can I do in the bedroom? Use night lights. Make sure that you have a light by your bed that is easy to reach. Do not use any sheets or blankets that are too big for your bed. They should not hang down onto the floor. Have a firm chair that has side arms. You can use this for support while you get dressed. Do not have throw rugs and other things on the floor that can make you trip. What can I do in the kitchen? Clean up any spills right away. Avoid walking on wet floors. Keep items that you use a lot in easy-to-reach places. If you need to reach something above you, use a strong step stool that has a grab bar. Keep electrical cords out of the way. Do not use floor polish or wax that makes floors slippery. If you must use wax, use non-skid floor wax. Do not have throw rugs and other things on the floor that can make you trip. What can I do with my stairs? Do not leave any items on the stairs. Make sure that there are handrails on both sides of the stairs and use them. Fix handrails that are broken or loose. Make sure that handrails are as long as the stairways. Check any carpeting to make sure that it is firmly attached to  the stairs. Fix any carpet that is loose or worn. Avoid having throw rugs at the top or bottom of the stairs. If you do have throw rugs, attach them to the floor with carpet tape. Make sure that you have a light switch at the top of the stairs and the bottom of the stairs. If you do not have them, ask someone to add them for you. What else can I do to help prevent falls? Wear shoes that: Do not have high heels. Have rubber bottoms. Are comfortable and fit you well. Are closed at the toe. Do not wear sandals. If you use a stepladder: Make sure that it is fully opened. Do not climb a closed stepladder. Make sure that both sides of the stepladder are locked into place. Ask  someone to hold it for you, if possible. Clearly mark and make sure that you can see: Any grab bars or handrails. First and last steps. Where the edge of each step is. Use tools that help you move around (mobility aids) if they are needed. These include: Canes. Walkers. Scooters. Crutches. Turn on the lights when you go into a dark area. Replace any light bulbs as soon as they burn out. Set up your furniture so you have a clear path. Avoid moving your furniture around. If any of your floors are uneven, fix them. If there are any pets around you, be aware of where they are. Review your medicines with your doctor. Some medicines can make you feel dizzy. This can increase your chance of falling. Ask your doctor what other things that you can do to help prevent falls. This information is not intended to replace advice given to you by your health care provider. Make sure you discuss any questions you have with your health care provider. Document Released: 10/07/2009 Document Revised: 05/18/2016 Document Reviewed: 01/15/2015 Elsevier Interactive Patient Education  2017 [employer].

## 2022-03-06 DIAGNOSIS — M25531 Pain in right wrist: Secondary | ICD-10-CM | POA: Diagnosis not present

## 2022-03-06 DIAGNOSIS — M25532 Pain in left wrist: Secondary | ICD-10-CM | POA: Diagnosis not present

## 2022-03-06 DIAGNOSIS — M791 Myalgia, unspecified site: Secondary | ICD-10-CM | POA: Diagnosis not present

## 2022-03-06 DIAGNOSIS — M79642 Pain in left hand: Secondary | ICD-10-CM | POA: Diagnosis not present

## 2022-03-06 DIAGNOSIS — M2550 Pain in unspecified joint: Secondary | ICD-10-CM | POA: Diagnosis not present

## 2022-03-06 DIAGNOSIS — M3501 Sicca syndrome with keratoconjunctivitis: Secondary | ICD-10-CM | POA: Diagnosis not present

## 2022-03-06 DIAGNOSIS — M1990 Unspecified osteoarthritis, unspecified site: Secondary | ICD-10-CM | POA: Diagnosis not present

## 2022-03-12 ENCOUNTER — Encounter: Payer: Self-pay | Admitting: Certified Registered Nurse Anesthetist

## 2022-03-16 DIAGNOSIS — N183 Chronic kidney disease, stage 3 unspecified: Secondary | ICD-10-CM | POA: Diagnosis not present

## 2022-03-16 DIAGNOSIS — R8279 Other abnormal findings on microbiological examination of urine: Secondary | ICD-10-CM | POA: Diagnosis not present

## 2022-03-18 ENCOUNTER — Other Ambulatory Visit: Payer: Self-pay | Admitting: Family Medicine

## 2022-03-18 ENCOUNTER — Other Ambulatory Visit: Payer: Self-pay | Admitting: Cardiology

## 2022-03-18 DIAGNOSIS — I10 Essential (primary) hypertension: Secondary | ICD-10-CM

## 2022-03-20 NOTE — Progress Notes (Signed)
? ?Subjective:  ? ? Patient ID: [name], female    DOB: 1946-07-24, 76 y.o.   MRN: [id number] ? ?Chief Complaint  ?Patient presents with  ? Follow-up  ? Stage 3 Chronic kidney disease  ? ? ?HPI ?Patient is in today for a follow up. She denies any recent febrile illness or hospitalizations. She has been seen by nephrology and urology and been advised that she hs stage 3 renal disease. She is concerned because she feels she was not given any concrete advice on how to manage this so it does not progress too quickly. She is trying to hydrate well and maintain a heart healthy diet. Denies CP/palp/SOB/HA/congestion/fevers/GI or GU c/o. Taking meds as prescribed  ? ?Past Medical History:  ?Diagnosis Date  ? Allergy   ? Anemia   ? Anxiety   ? past hx   ? Arthritis   ? Asthma   ? Atrial flutter ([place])   ? past history- not current  ? Breast cancer ([place])   ? CAD (coronary artery disease) CARDIOLOGIST--  DR Angelena Form  ? mild non-obstructive cad  ? Cancer [facility])   ? right  ? Cataract   ? bilaterally removed   ? Chronic constipation   ? Chronic kidney disease   ? stage 3 ckd, interstitial cystitis  ? Concussion   ? x 3  ? COPD (chronic obstructive pulmonary disease) ([place])   ? Depression   ? past hx   ? Family history of malignant hyperthermia   ? father had this  ? Fibromyalgia   ? Frequency of urination   ? GERD (gastroesophageal reflux disease)   ? H/O hiatal hernia   ? History of basal cell carcinoma excision   ? X2  ? History of breast cancer ONCOLOGIST-- DR Jana Hakim---  NO RECURRANCE  ? DX 07/2012;  LOW GRADE DCIS  ER+PR+  ----  S/P RIGHT LUMPECTOMY WITH NEGATIVE MARGINS/   RADIATION ENDED 11/2012  ? History of chronic bronchitis   ? History of colonic polyps   ? History of tachycardia   ? CONTROLLED  WITH ATENOLOL  ? Hyperlipidemia   ? Hypertension   ? Neuromuscular disorder ([place])   ? fibromyalgia  ? Neuropathy   ? Osteoporosis 01/2019  ? T score -2.2 stable/improved from prior study  ? Pelvic pain   ? Personal history of  radiation therapy   ? S/P radiation therapy 11/12/12 - 12/05/12  ? right Breast  ? Sepsis ([place]) 2014  ? from UTI   ? Sinus headache   ? Sjogren's disease ([place])   ? Urgency of urination   ? ? ?Past Surgical History:  ?Procedure Laterality Date  ? 59 HOUR [place] STUDY N/A 04/03/2016  ? Procedure: [place] STUDY;  Surgeon: Manus Gunning, MD;  Location: [facility];  Service: Gastroenterology;  Laterality: N/A;  ? BREAST BIOPSY Right 08/23/2012  ? ADH  ? BREAST BIOPSY Right 10/11/2012  ? Ductal Carcinoma  ? BREAST EXCISIONAL BIOPSY Right 09/04/2013  ? benign  ? BREAST LUMPECTOMY Right 10-11-2012  ? W/ SLN BX  ? CARDIAC CATHETERIZATION  09-13-2007  DR Lia Foyer  ? WELL-PRESERVED LVF/  DIFFUSE SCATTERED CORONARY CALCIFACATION AND ATHEROSCLEROSIS WITHOUT OBSTRUCTION  ? CARDIAC CATHETERIZATION  08-04-2010  DR Angelena Form  ? NON-OBSTRUCTIVE CAD/  pLAD 40%/  oLAD 30%/  mLAD 30%/  pRCA 30%/  EF 60%  ? CARDIOVASCULAR STRESS TEST  06-18-2012  DR Angelena Form  ? LOW RISK NUCLEAR STUDY/  SMALL FIXED AREA OF MODERATELY  DECREASED UPTAKE IN ANTEROSEPTAL WALL WHICH MAY BE ARTIFACTUAL/  NO ISCHEMIA/  EF 68%  ? COLONOSCOPY  09-29-2010  ? CYSTOSCOPY    ? CYSTOSCOPY WITH HYDRODISTENSION AND BIOPSY N/A 03/06/2014  ? Procedure: CYSTOSCOPY/HYDRODISTENSION/ INSTILATION OF MARCAINE AND PYRIDIUM;  Surgeon: Ailene Rud, MD;  Location: [facility];  Service: Urology;  Laterality: N/A;  ? ESOPHAGEAL MANOMETRY N/A 04/03/2016  ? Procedure: ESOPHAGEAL MANOMETRY (EM);  Surgeon: Manus Gunning, MD;  Location: [facility];  Service: Gastroenterology;  Laterality: N/A;  ? EXTRACORPOREAL SHOCK WAVE LITHOTRIPSY Left 02/06/2019  ? Procedure: EXTRACORPOREAL SHOCK WAVE LITHOTRIPSY (ESWL);  Surgeon: Kathie Rhodes, MD;  Location: [facility];  Service: Urology;  Laterality: Left;  ? NASAL SINUS SURGERY  1985  ? ORIF RIGHT ANKLE  FX  2006  ? POLYPECTOMY    ? REMOVAL VOCAL CORD CYST  FEB 2014  ? RIGHT BREAST BX  08-23-2012  ? RIGHT HAND  SURGERY  X3  LAST ONE 2009  ? INCLUDES  ORIF RIGHT 5TH FINGER AND REVISION TWICE  ? SKIN CANCER EXCISION    ? TONSILLECTOMY AND ADENOIDECTOMY  AGE 41  ? TOTAL ABDOMINAL HYSTERECTOMY W/ BILATERAL SALPINGOOPHORECTOMY  1982  ? W/  APPENDECTOMY  ? TRANSTHORACIC ECHOCARDIOGRAM  06-24-2012  ? GRADE I DIASTOLIC DYSFUNCTION/  EF 55-60%/  MILD MR  ? UPPER GASTROINTESTINAL ENDOSCOPY    ? ? ?Family History  ?Problem Relation Age of Onset  ? Rectal cancer Mother   ? Colon cancer Mother   ? Pancreatic cancer Mother   ? Diabetes Mother   ? Breast cancer Mother 26  ? Breast cancer Maternal Aunt   ?     breast  ? Birth defects Maternal Aunt   ? Irritable bowel syndrome Son   ? Heart disease Son   ?     CAD, MV replacement  ? Allergic Disorder Daughter   ? Diabetes Daughter   ? Colon cancer Father   ? Colon polyps Father   ? Diabetes Father   ? Stroke Father   ? Heart disease Father   ?     CHF  ? Hyperlipidemia Father   ? Hypertension Father   ? Arthritis Father   ? Breast cancer Paternal Aunt   ? Breast cancer Paternal Aunt   ? Arthritis Paternal Uncle   ? Allergic Disorder Daughter   ? Heart disease Cousin   ?     CAD, had a blood clot after stenting  ? Esophageal cancer Neg Hx   ? Stomach cancer Neg Hx   ? ? ?Social History  ? ?Socioeconomic History  ? Marital status: Married  ?  Spouse name: Jori Moll   ? Number of children: 3  ? Years of education: BA  ? Highest education level: Not on file  ?Occupational History  ? Occupation: Retired Therapist, sports  ?  Employer: RETIRED  ?Tobacco Use  ? Smoking status: Former  ?  Packs/day: 1.00  ?  Years: 15.00  ?  Pack years: 15.00  ?  Types: Cigarettes  ?  Quit date: 05/27/2005  ?  Years since quitting: 16.8  ? Smokeless tobacco: Never  ?Vaping Use  ? Vaping Use: Never used  ?Substance and Sexual Activity  ? Alcohol use: No  ?  Alcohol/week: 0.0 standard drinks  ? Drug use: No  ? Sexual activity: Not Currently  ?  Partners: Male  ?  Birth control/protection: Surgical  ?  Comment: 1st intercourse 76  yo-Fewer than 5 partners, hysterectomy  ?  Other Topics Concern  ? Not on file  ?Social History Narrative  ? Lives with husband.  ? Caffeine use: 1/2 cup per day  ? Exercise-- 2days a week [employer],  Water aerobics, walking   ?   ? Retired Marine scientist  ? No dietary restrictions, tries to maintain a heart healthy diet  ? ?Social Determinants of Health  ? ?Financial Resource Strain: Low Risk   ? Difficulty of Paying Living Expenses: Not hard at all  ?Food Insecurity: No Food Insecurity  ? Worried About Charity fundraiser in the Last Year: Never true  ? Ran Out of Food in the Last Year: Never true  ?Transportation Needs: No Transportation Needs  ? Lack of Transportation (Medical): No  ? Lack of Transportation (Non-Medical): No  ?Physical Activity: Not on file  ?Stress: Not on file  ?Social Connections: Socially Integrated  ? Frequency of Communication with Friends and Family: More than three times a week  ? Frequency of Social Gatherings with Friends and Family: More than three times a week  ? Attends Religious Services: More than 4 times per year  ? Active Member of Clubs or Organizations: Yes  ? Attends Archivist Meetings: More than 4 times per year  ? Marital Status: Married  ?Intimate Partner Violence: Not At Risk  ? Fear of Current or Ex-Partner: No  ? Emotionally Abused: No  ? Physically Abused: No  ? Sexually Abused: No  ? ? ?Outpatient Medications Prior to Visit  ?Medication Sig Dispense Refill  ? acetaminophen (TYLENOL) 500 MG tablet Take 1,000 mg by mouth every 6 (six) hours as needed for mild pain or headache.     ? albuterol (VENTOLIN HFA) 108 (90 Base) MCG/ACT inhaler Inhale 2 puffs into the lungs every 4 (four) hours as needed. 1 each 0  ? amLODipine (NORVASC) 10 MG tablet Take 1 tablet by mouth once daily 90 tablet 0  ? aspirin EC 81 MG tablet Take 1 tablet (81 mg total) by mouth daily. 90 tablet 3  ? atenolol (TENORMIN) 50 MG tablet Take 1 tablet by mouth twice daily 180 tablet 0  ? Cyanocobalamin  (VITAMIN B-12) 5000 MCG TBDP Take 1 tablet by mouth daily with breakfast.    ? denosumab (PROLIA) 60 MG/ML SOSY injection Inject 60 mg into the skin every 6 (six) months.    ? DULoxetine (CYMBALTA) 60 MG capsule Ta

## 2022-03-21 ENCOUNTER — Encounter: Payer: Self-pay | Admitting: Family Medicine

## 2022-03-21 ENCOUNTER — Ambulatory Visit (INDEPENDENT_AMBULATORY_CARE_PROVIDER_SITE_OTHER): Payer: Medicare Other | Admitting: Family Medicine

## 2022-03-21 VITALS — BP 110/60 | HR 67 | Resp 20 | Ht 66.0 in | Wt 125.0 lb

## 2022-03-21 DIAGNOSIS — N189 Chronic kidney disease, unspecified: Secondary | ICD-10-CM | POA: Diagnosis not present

## 2022-03-21 DIAGNOSIS — Z789 Other specified health status: Secondary | ICD-10-CM

## 2022-03-21 DIAGNOSIS — R7989 Other specified abnormal findings of blood chemistry: Secondary | ICD-10-CM | POA: Diagnosis not present

## 2022-03-21 DIAGNOSIS — M2550 Pain in unspecified joint: Secondary | ICD-10-CM | POA: Diagnosis not present

## 2022-03-21 DIAGNOSIS — E785 Hyperlipidemia, unspecified: Secondary | ICD-10-CM

## 2022-03-21 DIAGNOSIS — R1310 Dysphagia, unspecified: Secondary | ICD-10-CM

## 2022-03-21 DIAGNOSIS — M810 Age-related osteoporosis without current pathological fracture: Secondary | ICD-10-CM | POA: Diagnosis not present

## 2022-03-21 DIAGNOSIS — I10 Essential (primary) hypertension: Secondary | ICD-10-CM

## 2022-03-21 DIAGNOSIS — R053 Chronic cough: Secondary | ICD-10-CM

## 2022-03-21 DIAGNOSIS — R739 Hyperglycemia, unspecified: Secondary | ICD-10-CM | POA: Diagnosis not present

## 2022-03-21 DIAGNOSIS — N3010 Interstitial cystitis (chronic) without hematuria: Secondary | ICD-10-CM

## 2022-03-21 DIAGNOSIS — M3500 Sicca syndrome, unspecified: Secondary | ICD-10-CM

## 2022-03-21 DIAGNOSIS — N289 Disorder of kidney and ureter, unspecified: Secondary | ICD-10-CM | POA: Diagnosis not present

## 2022-03-21 DIAGNOSIS — Z0000 Encounter for general adult medical examination without abnormal findings: Secondary | ICD-10-CM

## 2022-03-21 DIAGNOSIS — E538 Deficiency of other specified B group vitamins: Secondary | ICD-10-CM | POA: Diagnosis not present

## 2022-03-21 LAB — CBC
HCT: 38.7 % (ref 36.0–46.0)
Hemoglobin: 12.5 g/dL (ref 12.0–15.0)
MCHC: 32.2 g/dL (ref 30.0–36.0)
MCV: 91.3 fl (ref 78.0–100.0)
Platelets: 339 10*3/uL (ref 150.0–400.0)
RBC: 4.24 Mil/uL (ref 3.87–5.11)
RDW: 16.2 % — ABNORMAL HIGH (ref 11.5–15.5)
WBC: 8.7 10*3/uL (ref 4.0–10.5)

## 2022-03-21 LAB — LIPID PANEL
Cholesterol: 144 mg/dL (ref 0–200)
HDL: 47.5 mg/dL (ref >39.00)
LDL Cholesterol: 79 mg/dL (ref 0–99)
NonHDL: 96.83
Total CHOL/HDL Ratio: 3
Triglycerides: 88 mg/dL (ref 0.0–149.0)
VLDL: 17.6 mg/dL (ref 0.0–40.0)

## 2022-03-21 LAB — COMPREHENSIVE METABOLIC PANEL
ALT: 8 U/L (ref 0–35)
AST: 13 U/L (ref 0–37)
Albumin: 4.1 g/dL (ref 3.5–5.2)
Alkaline Phosphatase: 156 U/L — ABNORMAL HIGH (ref 39–117)
BUN: 17 mg/dL (ref 6–23)
CO2: 28 mEq/L (ref 19–32)
Calcium: 9.4 mg/dL (ref 8.4–10.5)
Chloride: 103 mEq/L (ref 96–112)
Creatinine, Ser: 1.1 mg/dL (ref 0.40–1.20)
GFR: 48.74 mL/min — ABNORMAL LOW (ref >60.00)
Glucose, Bld: 88 mg/dL (ref 70–99)
Potassium: 4.9 mEq/L (ref 3.5–5.1)
Sodium: 138 mEq/L (ref 135–145)
Total Bilirubin: 0.5 mg/dL (ref 0.2–1.2)
Total Protein: 6.4 g/dL (ref 6.0–8.3)

## 2022-03-21 LAB — VITAMIN B12: Vitamin B-12: 679 pg/mL (ref 211–911)

## 2022-03-21 LAB — TSH: TSH: 3.22 u[IU]/mL (ref 0.35–5.50)

## 2022-03-21 LAB — HEMOGLOBIN A1C: Hgb A1c MFr Bld: 5.8 % (ref 4.6–6.5)

## 2022-03-21 LAB — VITAMIN D 25 HYDROXY (VIT D DEFICIENCY, FRACTURES): VITD: 32.14 ng/mL (ref 30.00–100.00)

## 2022-03-21 NOTE — Assessment & Plan Note (Signed)
Hydrate and monitor 

## 2022-03-21 NOTE — Assessment & Plan Note (Signed)
Continue tp ,pmotpr ?

## 2022-03-21 NOTE — Assessment & Plan Note (Signed)
Supplement and monitor 

## 2022-03-21 NOTE — Patient Instructions (Signed)
Chronic Kidney Disease, Adult Chronic kidney disease (CKD) occurs when the kidneys are slowly and permanently damaged over a long period of time. The kidneys are a pair of organs that do many important jobs in the body, including: Removing waste and extra fluid from the blood to make urine. Making hormones that maintain the amount of fluid in tissues and blood vessels. Maintaining the right amount of fluids and chemicals in the body. A small amount of kidney damage may not cause problems, but a large amount of damage may make it hard or impossible for the kidneys to work right. Steps must be taken to slow kidney damage or to stop it from getting worse. If steps are not taken, the kidneys may stop working permanently (end-stage renal disease, or ESRD). Most of the time, CKD does not go away, but it can often becontrolled. People who have CKD are usually able to live full lives. What are the causes? The most common causes of this condition are diabetes and high blood pressure (hypertension). Other causes include: Cardiovascular diseases. These affect the heart and blood vessels. Kidney diseases. These include: Glomerulonephritis, or inflammation of the tiny filters in the kidneys. Interstitial nephritis. This is swelling of the small tubes of the kidneys and of the surrounding structures. Polycystic kidney disease, in which clusters of fluid-filled sacs form within the kidneys. Renal vascular disease. This includes disorders that affect the arteries and veins of the kidneys. Diseases that affect the body's defense system (immune system). A problem with urine flow. This may be caused by: Kidney stones. Cancer. An enlarged prostate, in males. A kidney infection or urinary tract infection (UTI) that keeps coming back. Vasculitis. This is swelling or inflammation of the blood vessels. What increases the risk? Your chances of having kidney disease increase with age. The following factors may make  you more likely to develop this condition: A family history of kidney disease or kidney failure. Kidney failure means the kidneys can no longer work right. Certain genetic diseases. Taking medicines often that are damaging to the kidneys. Being around or being in contact with toxic substances. Obesity. A history of tobacco use. What are the signs or symptoms? Symptoms of this condition include: Feeling very tired (lethargic) and having less energy. Swelling, or edema, of the face, legs, ankles, or feet. Nausea or vomiting, or loss of appetite. Confusion or trouble concentrating. Muscle twitches and cramps, especially in the legs. Dry, itchy skin. A metallic taste in the mouth. Producing less urine, or producing more urine (especially at night). Shortness of breath. Trouble sleeping. CKD may also result in not having enough red blood cells or hemoglobin in the blood (anemia) or having weak bones (bone disease). Symptoms develop slowly and may not be obvious until the kidney damage becomessevere. It is possible to have kidney disease for years without having symptoms. How is this diagnosed? This condition may be diagnosed based on: Blood tests. Urine tests. Imaging tests, such as an ultrasound or a CT scan. A kidney biopsy. This involves removing a sample of kidney tissue to be looked at under a microscope. Results from these tests will help to determine how serious the CKD is. How is this treated? There is no cure for most cases of this condition, but treatment usually relieves symptoms and prevents or slows the worsening of the disease. Treatment may include: Diet changes, which may require you to avoid alcohol and foods that are high in salt, potassium, phosphorous, and protein. Medicines. These may: Lower blood   pressure. Control blood sugar (glucose). Relieve anemia. Relieve swelling. Protect your bones. Improve the balance of salts and minerals in your blood  (electrolytes). Dialysis, which is a type of treatment that removes toxic waste from the body. It may be needed if you have kidney failure. Managing any other conditions that are causing your CKD or making it worse. Follow these instructions at home: Medicines Take over-the-counter and prescription medicines only as told by your health care provider. The amount of some medicines that you take may need to be changed. Do not take any new medicines unless approved by your health care provider. Many medicines can make kidney damage worse. Do not take any vitamin and mineral supplements unless approved by your health care provider. Many nutritional supplements can make kidney damage worse. Lifestyle  Do not use any products that contain nicotine or tobacco, such as cigarettes, e-cigarettes, and chewing tobacco. If you need help quitting, ask your health care provider. If you drink alcohol: Limit how much you use to: 0-1 drink a day for women who are not pregnant. 0-2 drinks a day for men. Know how much alcohol is in your drink. In the [place] one drink equals one 12 oz bottle of beer (355 mL), one 5 oz glass of wine (148 mL), or one 1 oz glass of hard liquor (44 mL). Maintain a healthy weight. If you need help, ask your health care provider.  General instructions  Follow instructions from your health care provider about eating or drinking restrictions, including any prescribed diet. Track your blood pressure at home. Report changes in your blood pressure as told. If you are being treated for diabetes, track your blood glucose levels as told. Start or continue an exercise plan. Exercise at least 30 minutes a day, 5 days a week. Keep your immunizations up to date as told. Keep all follow-up visits. This is important.  Where to find more information American Association of Kidney Patients: www.aakp.org National Kidney Foundation: www.kidney.org American Kidney Fund: www.akfinc.org Life Options:  www.lifeoptions.org Kidney School: www.kidneyschool.org Contact a health care provider if: Your symptoms get worse. You develop new symptoms. Get help right away if: You develop symptoms of ESRD. These include: Headaches. Numbness in your hands or feet. Easy bruising. Frequent hiccups. Chest pain. Shortness of breath. Lack of menstrual periods, in women. You have a fever. You are producing less urine than usual. You have pain or bleeding when you urinate or when you have a bowel movement. These symptoms may represent a serious problem that is an emergency. Do not wait to see if the symptoms will go away. Get medical help right away. Call your local emergency services (911 in the [place] Do not drive yourself to the hospital. Summary Chronic kidney disease (CKD) occurs when the kidneys become damaged slowly over a long period of time. The most common causes of this condition are diabetes and high blood pressure (hypertension). There is no cure for most cases of CKD, but treatment usually relieves symptoms and prevents or slows the worsening of the disease. Treatment may include a combination of lifestyle changes, medicines, and dialysis. This information is not intended to replace advice given to you by your health care provider. Make sure you discuss any questions you have with your healthcare provider. Document Revised: 03/17/2020 Document Reviewed: 03/17/2020 Elsevier Patient Education  2022 [employer]vier Inc.  

## 2022-03-22 ENCOUNTER — Other Ambulatory Visit: Payer: Self-pay

## 2022-03-22 ENCOUNTER — Encounter: Payer: Self-pay | Admitting: Gastroenterology

## 2022-03-22 ENCOUNTER — Ambulatory Visit (AMBULATORY_SURGERY_CENTER): Payer: Medicare Other | Admitting: Gastroenterology

## 2022-03-22 ENCOUNTER — Telehealth: Payer: Self-pay

## 2022-03-22 VITALS — BP 117/61 | HR 78 | Temp 97.7°F | Resp 15 | Ht 64.0 in | Wt 134.0 lb

## 2022-03-22 DIAGNOSIS — K529 Noninfective gastroenteritis and colitis, unspecified: Secondary | ICD-10-CM | POA: Diagnosis not present

## 2022-03-22 DIAGNOSIS — R194 Change in bowel habit: Secondary | ICD-10-CM

## 2022-03-22 DIAGNOSIS — K648 Other hemorrhoids: Secondary | ICD-10-CM | POA: Diagnosis not present

## 2022-03-22 DIAGNOSIS — K449 Diaphragmatic hernia without obstruction or gangrene: Secondary | ICD-10-CM | POA: Diagnosis not present

## 2022-03-22 DIAGNOSIS — R1310 Dysphagia, unspecified: Secondary | ICD-10-CM | POA: Diagnosis not present

## 2022-03-22 DIAGNOSIS — K219 Gastro-esophageal reflux disease without esophagitis: Secondary | ICD-10-CM | POA: Diagnosis not present

## 2022-03-22 DIAGNOSIS — K625 Hemorrhage of anus and rectum: Secondary | ICD-10-CM

## 2022-03-22 DIAGNOSIS — R1030 Lower abdominal pain, unspecified: Secondary | ICD-10-CM

## 2022-03-22 DIAGNOSIS — K6289 Other specified diseases of anus and rectum: Secondary | ICD-10-CM

## 2022-03-22 DIAGNOSIS — J449 Chronic obstructive pulmonary disease, unspecified: Secondary | ICD-10-CM | POA: Diagnosis not present

## 2022-03-22 DIAGNOSIS — Z86010 Personal history of colonic polyps: Secondary | ICD-10-CM

## 2022-03-22 MED ORDER — SODIUM CHLORIDE 0.9 % IV SOLN: 500.0000 mL | Freq: Once | INTRAVENOUS | Status: DC

## 2022-03-22 NOTE — Progress Notes (Signed)
Called to room to assist during endoscopic procedure.  Patient ID and intended procedure confirmed with present staff. Received instructions for my participation in the procedure from the performing physician.  

## 2022-03-22 NOTE — Telephone Encounter (Signed)
-----   Message from Yetta Flock, MD sent at 03/22/2022  4:37 PM EDT ----- ?Regarding: MRI order ?Can you help coordinate an MRI pelvis with and without contrast for this patient, rule out rectovaginal fistula? Thanks ? ? ?

## 2022-03-22 NOTE — Assessment & Plan Note (Signed)
Patient does not tolerate statins. Continue to monitor ?

## 2022-03-22 NOTE — Assessment & Plan Note (Signed)
Encouraged to get adequate exercise, calcium and vitamin d intake 

## 2022-03-22 NOTE — Progress Notes (Signed)
VS by DT    

## 2022-03-22 NOTE — Progress Notes (Signed)
1455 Ephedrine 10 mg given IV due to low BP, MD updated.   ?

## 2022-03-22 NOTE — Patient Instructions (Addendum)
Handout on hemorrhoids and hiatal hernia  provided  ? ?Await pathology results.  ? ?Continue current medications.  ? ?No further colonoscopy is recommend due to age  ? ?Post dilation diet. Handout provided  ? ? ?YOU HAD AN ENDOSCOPIC PROCEDURE TODAY AT [facility]:   Refer to the procedure report that was given to you for any specific questions about what was found during the examination.  If the procedure report does not answer your questions, please call your gastroenterologist to clarify.  If you requested that your care partner not be given the details of your procedure findings, then the procedure report has been included in a sealed envelope for you to review at your convenience later. ? ?YOU SHOULD EXPECT: Some feelings of bloating in the abdomen. Passage of more gas than usual.  Walking can help get rid of the air that was put into your GI tract during the procedure and reduce the bloating. If you had a lower endoscopy (such as a colonoscopy or flexible sigmoidoscopy) you may notice spotting of blood in your stool or on the toilet paper. If you underwent a bowel prep for your procedure, you may not have a normal bowel movement for a few days. ? ?Please Note:  You might notice some irritation and congestion in your nose or some drainage.  This is from the oxygen used during your procedure.  There is no need for concern and it should clear up in a day or so. ? ?SYMPTOMS TO REPORT IMMEDIATELY: ? ?Following lower endoscopy (colonoscopy or flexible sigmoidoscopy): ? Excessive amounts of blood in the stool ? Significant tenderness or worsening of abdominal pains ? Swelling of the abdomen that is new, acute ? Fever of 100?F or higher ? ?Following upper endoscopy (EGD) ? Vomiting of blood or coffee ground material ? New chest pain or pain under the shoulder blades ? Painful or persistently difficult swallowing ? New shortness of breath ? Fever of 100?F or higher ? Black, tarry-looking stools ? ?For  urgent or emergent issues, a gastroenterologist can be reached at any hour by calling [phone]. ?Do not use MyChart messaging for urgent concerns.  ? ? ?DIET:  post dilation diet. See handout.  Drink plenty of fluids but you should avoid alcoholic beverages for 24 hours. ? ?ACTIVITY:  You should plan to take it easy for the rest of today and you should NOT DRIVE or use heavy machinery until tomorrow (because of the sedation medicines used during the test).   ? ?FOLLOW UP: ?Our staff will call the number listed on your records 48-72 hours following your procedure to check on you and address any questions or concerns that you may have regarding the information given to you following your procedure. If we do not reach you, we will leave a message.  We will attempt to reach you two times.  During this call, we will ask if you have developed any symptoms of COVID 19. If you develop any symptoms (ie: fever, flu-like symptoms, shortness of breath, cough etc.) before then, please call [phone].  If you test positive for Covid 19 in the 2 weeks post procedure, please call and report this information to [place].   ? ?If any biopsies were taken you will be contacted by phone or by letter within the next 1-3 weeks.  Please call us at [phone] if you have not heard about the biopsies in 3 weeks.  ? ? ?SIGNATURES/CONFIDENTIALITY: ?You and/or your care partner have  signed paperwork which will be entered into your electronic medical record.  These signatures attest to the fact that that the information above on your After Visit Summary has been reviewed and is understood.  Full responsibility of the confidentiality of this discharge information lies with you and/or your care-partner. ? ?

## 2022-03-22 NOTE — Assessment & Plan Note (Signed)
Stage 3, has been seen by nephrology and will follow with serial labs here for the time being, avoid NSAIDs, hydrate consistently throughout the day and avoid OTC meds without checking with [place].  ?

## 2022-03-22 NOTE — Progress Notes (Signed)
[facility] History and Physical ? ? ?Primary Care Physician:  Mosie Lukes, MD ? ? ?Reason for Procedure:   Dysphagia, history of colon polyps ? ?Plan:    EGD with dilation, colonoscopy ? ? ? ? ?HPI: [name] is a 76 y.o. female  here for colonoscopy surveillance for polyps and EGD with dilation to treat UES stricture. Patient endorses ongoing bloating and erratic bowel habits. No changes since I have last seen her in the office. Otherwise feels well without any cardiopulmonary symptoms. She does have TMJ and sore jaw, we discussed use of bite block that is need to dilate to 87m which she has had done in the past and feel she can do this today. Discussed risks / benefits of the procedures and she wants to proceed. ? ? ?Past Medical History:  ?Diagnosis Date  ? Allergy   ? Anemia   ? Anxiety   ? past hx   ? Arthritis   ? Asthma   ? [place] fl[place]   ? past history- not current  ? Breast cancer [place]   ? CAD (coronary artery disease) CARDIOLOGIST--  DR MAngelena Form ? mild non-obstructive cad  ? Cancer ([facility]   ? right  ? Cataract   ? bilaterally removed   ? Chronic constipation   ? Chronic kidney disease   ? stage 3 ckd, interstitial cystitis  ? Concussion   ? x 3  ? COPD (chronic obstructive pulmonary disease) ([place]   ? Depression   ? past hx   ? Family history of malignant hyperthermia   ? father had this  ? Fibromyalgia   ? Frequency of urination   ? GERD (gastroesophageal reflux disease)   ? H/O hiatal hernia   ? History of basal cell carcinoma excision   ? X2  ? History of breast cancer ONCOLOGIST-- DR MJana Hakim--  NO RECURRANCE  ? DX 07/2012;  LOW GRADE DCIS  ER+PR+  ----  S/P RIGHT LUMPECTOMY WITH NEGATIVE MARGINS/   RADIATION ENDED 11/2012  ? History of chronic bronchitis   ? History of colonic polyps   ? History of tachycardia   ? CONTROLLED  WITH ATENOLOL  ? Hyperlipidemia   ? Hypertension   ? Neuromuscular disorder ([facility]   ? fibromyalgia  ? Neuropathy   ? Osteoporosis 01/2019  ? T  score -2.2 stable/improved from prior study  ? Pelvic pain   ? Personal history of radiation therapy   ? S/P radiation therapy 11/12/12 - 12/05/12  ? right Breast  ? Sepsis ([facility]  ? from UTI   ? Sinus headache   ? Sjogren's disease ([place]   ? Urgency of urination   ? ? ?Past Surgical History:  ?Procedure Laterality Date  ? [place] N[place] 04/03/2016  ? Procedure: [place];  Surgeon: SManus Gunning MD;  Location: [facility];  Service: Gastroenterology;  Laterality: N/A;  ? BREAST BIOPSY Right 08/23/2012  ? ADH  ? BREAST BIOPSY Right 10/11/2012  ? Ductal Carcinoma  ? BREAST EXCISIONAL BIOPSY Right 09/04/2013  ? benign  ? BREAST LUMPECTOMY Right 10-11-2012  ? W/ SLN BX  ? CARDIAC CATHETERIZATION  09-13-2007  DR SLia Foyer ? WELL-PRESERVED LVF/  DIFFUSE SCATTERED CORONARY CALCIFACATION AND ATHEROSCLEROSIS WITHOUT OBSTRUCTION  ? CARDIAC CATHETERIZATION  08-04-2010  DR MAngelena Form ? NON-OBSTRUCTIVE CAD/  pLAD 40%/  oLAD 30%/  mLAD 30%/  pRCA 30%/  EF 60%  ? CARDIOVASCULAR STRESS TEST  06-18-2012  DR Angelena Form  ? LOW RISK NUCLEAR STUDY/  SMALL FIXED AREA OF MODERATELY DECREASED UPTAKE IN ANTEROSEPTAL WALL WHICH MAY BE ARTIFACTUAL/  NO ISCHEMIA/  EF 68%  ? COLONOSCOPY  09-29-2010  ? CYSTOSCOPY    ? CYSTOSCOPY WITH HYDRODISTENSION AND BIOPSY N/A 03/06/2014  ? Procedure: CYSTOSCOPY/HYDRODISTENSION/ INSTILATION OF MARCAINE AND PYRIDIUM;  Surgeon: Ailene Rud, MD;  Location: [facility];  Service: Urology;  Laterality: N/A;  ? ESOPHAGEAL MANOMETRY N/A 04/03/2016  ? Procedure: ESOPHAGEAL MANOMETRY (EM);  Surgeon: Manus Gunning, MD;  Location: [facility];  Service: Gastroenterology;  Laterality: N/A;  ? EXTRACORPOREAL SHOCK WAVE LITHOTRIPSY Left 02/06/2019  ? Procedure: EXTRACORPOREAL SHOCK WAVE LITHOTRIPSY (ESWL);  Surgeon: Kathie Rhodes, MD;  Location: [facility];  Service: Urology;  Laterality: Left;  ? NASAL SINUS SURGERY  1985  ? ORIF RIGHT ANKLE  FX  2006  ? POLYPECTOMY    ?  REMOVAL VOCAL CORD CYST  FEB 2014  ? RIGHT BREAST BX  08-23-2012  ? RIGHT HAND SURGERY  X3  LAST ONE 2009  ? INCLUDES  ORIF RIGHT 5TH FINGER AND REVISION TWICE  ? SKIN CANCER EXCISION    ? TONSILLECTOMY AND ADENOIDECTOMY  AGE 50  ? TOTAL ABDOMINAL HYSTERECTOMY W/ BILATERAL SALPINGOOPHORECTOMY  1982  ? W/  APPENDECTOMY  ? TRANSTHORACIC ECHOCARDIOGRAM  06-24-2012  ? GRADE I DIASTOLIC DYSFUNCTION/  EF 55-60%/  MILD MR  ? UPPER GASTROINTESTINAL ENDOSCOPY    ? ? ?Prior to Admission medications   ?Medication Sig Start Date End Date Taking? Authorizing Provider  ?amLODipine (NORVASC) 10 MG tablet Take 1 tablet by mouth once daily 01/17/22  Yes Crenshaw, Denice Bors, MD  ?aspirin EC 81 MG tablet Take 1 tablet (81 mg total) by mouth daily. 05/28/14  Yes Burnell Blanks, MD  ?atenolol (TENORMIN) 50 MG tablet Take 1 tablet by mouth twice daily 02/16/22  Yes Mosie Lukes, MD  ?Cyanocobalamin (VITAMIN B-12) 5000 MCG TBDP Take 1 tablet by mouth daily with breakfast.   Yes [provider]  ?DULoxetine (CYMBALTA) 60 MG capsule Take 2 capsules by mouth once daily 11/29/21  Yes Mosie Lukes, MD  ?Estradiol 10 MCG TABS vaginal tablet Place 1 tablet (10 mcg total) vaginally 2 (two) times a week. 11/10/19  Yes Fontaine, Belinda Block, MD  ?fluticasone (FLONASE) 50 MCG/ACT nasal spray Use 2 spray(s) in each nostril once daily 11/15/20  Yes Mosie Lukes, MD  ?furosemide (LASIX) 20 MG tablet Take 1 tablet (20 mg total) by mouth as needed for edema. 10/19/21  Yes Lelon Perla, MD  ?gabapentin (NEURONTIN) 300 MG capsule Take by mouth. 02/11/22  Yes [provider]  ?hydroxychloroquine (PLAQUENIL) 200 MG tablet 1 tablet with food or milk 12/06/21  Yes [provider]  ?losartan (COZAAR) 100 MG tablet Take 1 tablet (100 mg total) by mouth daily. 03/20/22  Yes Lelon Perla, MD  ?mupirocin ointment (BACTROBAN) 2 % Apply thin film twice daily. 03/10/19  Yes Saguier, Percell Miller, PA-C  ?nystatin cream (MYCOSTATIN)  Apply 1 application topically 2 (two) times daily. 05/17/21  Yes Mosie Lukes, MD  ?pantoprazole (PROTONIX) 40 MG tablet Take 1 tablet by mouth once daily 01/24/22  Yes Mosie Lukes, MD  ?traZODone (DESYREL) 100 MG tablet TAKE 1 TABLET BY MOUTH AT BEDTIME 03/20/22  Yes Mosie Lukes, MD  ?acetaminophen (TYLENOL) 500 MG tablet Take 1,000 mg by mouth every 6 (six) hours as needed for mild pain or headache.  [provider]  ?albuterol (VENTOLIN HFA) 108 (90 Base) MCG/ACT inhaler Inhale 2 puffs into the lungs every 4 (four) hours as needed. 08/18/21   Collene Gobble, MD  ?denosumab (PROLIA) 60 MG/ML SOSY injection Inject 60 mg into the skin every 6 (six) months.    [provider]  ?nitroGLYCERIN (NITROSTAT) 0.4 MG SL tablet Place 1 tablet (0.4 mg total) under the tongue every 5 (five) minutes as needed for chest pain. 07/06/19   Mosie Lukes, MD  ?pentosan polysulfate (ELMIRON) 100 MG capsule Take 1 capsule (100 mg total) by mouth 3 (three) times daily. Reported on 01/05/2016 ?Patient taking differently: Take 100 mg by mouth 3 (three) times daily with meals as needed. Reported on 01/05/2016 02/01/17   Terrance Mass, MD  ?polyethylene glycol (MIRALAX) 17 g packet Take 17 g by mouth daily. 11/10/20   Yetta Flock, MD  ?REPATHA SURECLICK 818 MG/ML SOAJ INJECT 140 MG SUBCUTANEOUSLY EVERY 14 DAYS 07/21/21   Lelon Perla, MD  ?sucralfate (CARAFATE) 1 GM/10ML suspension Take 10 mLs (1 g total) by mouth 4 (four) times daily -  with meals and at bedtime. 06/07/21   Mosie Lukes, MD  ? ? ?Current Outpatient Medications  ?Medication Sig Dispense Refill  ? amLODipine (NORVASC) 10 MG tablet Take 1 tablet by mouth once daily 90 tablet 0  ? aspirin EC 81 MG tablet Take 1 tablet (81 mg total) by mouth daily. 90 tablet 3  ? atenolol (TENORMIN) 50 MG tablet Take 1 tablet by mouth twice daily 180 tablet 0  ? Cyanocobalamin (VITAMIN B-12) 5000 MCG TBDP Take 1 tablet by mouth daily with breakfast.     ? DULoxetine (CYMBALTA) 60 MG capsule Take 2 capsules by mouth once daily 180 capsule 1  ? Estradiol 10 MCG TABS vaginal tablet Place 1 tablet (10 mcg total) vaginally 2 (two) times a week. 24 table

## 2022-03-22 NOTE — Op Note (Signed)
[facility] ?Patient Name: [name] ?Procedure Date: 03/22/2022 2:22 PM ?MRN: [id number] ?Endoscopist: Carlota Raspberry. Havery Moros , MD ?Age: 76 ?Referring MD:  ?Date of Birth: 02/19/1946 ?Gender: Female ?Account #: [id number] ?Procedure:                Colonoscopy ?Indications:              High risk colon cancer surveillance: Personal  ?                          history of colonic polyps, erratic bowel habits,  ?                          also with subjective concern for rectovaginal  ?                          fistula based on symptoms ?Medicines:                Monitored Anesthesia Care ?Procedure:                Pre-Anesthesia Assessment: ?                          - Prior to the procedure, a History and Physical  ?                          was performed, and patient medications and  ?                          allergies were reviewed. The patient's tolerance of  ?                          previous anesthesia was also reviewed. The risks  ?                          and benefits of the procedure and the sedation  ?                          options and risks were discussed with the patient.  ?                          All questions were answered, and informed consent  ?                          was obtained. Prior Anticoagulants: The patient has  ?                          taken no previous anticoagulant or antiplatelet  ?                          agents. ASA Grade Assessment: III - A patient with  ?                          severe systemic disease. After reviewing the risks  ?  and benefits, the patient was deemed in  ?                          satisfactory condition to undergo the procedure. ?                          After obtaining informed consent, the colonoscope  ?                          was passed under direct vision. Throughout the  ?                          procedure, the patient's blood pressure, pulse, and  ?                          oxygen saturations were monitored  continuously. The  ?                          Olympus Scope [id number] was introduced through the  ?                          anus and advanced to the the cecum, identified by  ?                          appendiceal orifice and ileocecal valve. The  ?                          colonoscopy was technically difficult and complex  ?                          due to significant looping. The patient tolerated  ?                          the procedure well. The quality of the bowel  ?                          preparation was adequate. The ileocecal valve,  ?                          appendiceal orifice, and rectum were photographed. ?Scope In: 2:52:52 PM ?Scope Out: 3:15:29 PM ?Scope Withdrawal Time: 0 hours 18 minutes 32 seconds  ?Total Procedure Duration: 0 hours 22 minutes 37 seconds  ?Findings:                 The perianal and digital rectal examinations were  ?                          normal. ?                          A large amount of semi-liquid stool was found in  ?                          the ascending colon and in the cecum, making  ?  visualization difficult initially. Lavage of the  ?                          area was performed using copious amounts of sterile  ?                          water, resulting in clearance with adequate  ?                          visualization. ?                          Anal papilla(e) were hypertrophied. ?                          Internal hemorrhoids were found during  ?                          retroflexion. The hemorrhoids were small. ?                          The colon was tortuous with looping. Cecal  ?                          intubation was technically challenging. ?                          The exam was otherwise without abnormality. No  ?                          overt evidence of fistula in the colon. No  ?                          inflammatory changes ?                          Biopsies for histology were taken with a cold  ?                           forceps from the right colon, left colon and  ?                          transverse colon for evaluation of microscopic  ?                          colitis. ?Complications:            No immediate complications. Estimated blood loss:  ?                          Minimal. ?Estimated Blood Loss:     Estimated blood loss was minimal. ?Impression:               - Stool in the ascending colon and in the cecum. ?                          - Anal papilla(e) were  hypertrophied. ?                          - Internal hemorrhoids. ?                          - Tortuous colon. ?                          - The examination was otherwise normal. ?                          - Biopsies were taken with a cold forceps from the  ?                          right colon, left colon and transverse colon for  ?                          evaluation of microscopic colitis. ?                          No polyps, no inflammatory changes, no evidence of  ?                          fistula. ?Recommendation:           - Patient has a contact number available for  ?                          emergencies. The signs and symptoms of potential  ?                          delayed complications were discussed with the  ?                          patient. Return to normal activities tomorrow.  ?                          Written discharge instructions were provided to the  ?                          patient. ?                          - Resume previous diet. ?                          - Continue present medications. ?                          - Await pathology results ?                          - No further surveillance colonoscopy is  ?                          recommended due to age ?                          -  Consider repeat MRI pelvis if continued concern  ?                          for rectovaginal fistula (last done in 2018 and  ?                          negative), follow up with colorectal surgery if  ?                          needed ?Remo Lipps P.  Havery Moros, MD ?03/22/2022 3:23:57 PM ?This report has been signed electronically. ?

## 2022-03-22 NOTE — Assessment & Plan Note (Signed)
Encourage heart healthy diet such as MIND or DASH diet, increase exercise, avoid trans fats, simple carbohydrates and processed foods, consider a krill or fish or flaxseed oil cap daily.  °

## 2022-03-22 NOTE — Assessment & Plan Note (Signed)
[id number] acceptable, minimize simple carbs. Increase exercise as tolerated.  

## 2022-03-22 NOTE — Op Note (Signed)
[facility] ?Patient Name: [name] ?Procedure Date: 03/22/2022 2:22 PM ?MRN: [id number] ?Endoscopist: Carlota Raspberry. Havery Moros , MD ?Age: 76 ?Referring MD:  ?Date of Birth: Feb 04, 1946 ?Gender: Female ?Account #: [id number] ?Procedure:                Upper GI endoscopy ?Indications:              Dysphagia - history of subtle stenosis just  ?                          inferior to the UES, dilated to 44m in the past  ?                          with good result ?Medicines:                Monitored Anesthesia Care ?Procedure:                Pre-Anesthesia Assessment: ?                          - Prior to the procedure, a History and Physical  ?                          was performed, and patient medications and  ?                          allergies were reviewed. The patient's tolerance of  ?                          previous anesthesia was also reviewed. The risks  ?                          and benefits of the procedure and the sedation  ?                          options and risks were discussed with the patient.  ?                          All questions were answered, and informed consent  ?                          was obtained. Prior Anticoagulants: The patient has  ?                          taken no previous anticoagulant or antiplatelet  ?                          agents. ASA Grade Assessment: III - A patient with  ?                          severe systemic disease. After reviewing the risks  ?                          and benefits, the patient was deemed in  ?  satisfactory condition to undergo the procedure. ?                          After obtaining informed consent, the endoscope was  ?                          passed under direct vision. Throughout the  ?                          procedure, the patient's blood pressure, pulse, and  ?                          oxygen saturations were monitored continuously. The  ?                          Endoscope was introduced through the  mouth, and  ?                          advanced to the second part of duodenum. The upper  ?                          GI endoscopy was accomplished without difficulty.  ?                          The patient tolerated the procedure well. ?Scope In: ?Scope Out: ?Findings:                 Esophagogastric landmarks were identified: the  ?                          Z-line was found at 39 cm, the gastroesophageal  ?                          junction was found at 39 cm and the upper extent of  ?                          the gastric folds was found at 40 cm from the  ?                          incisors. ?                          A 1 cm hiatal hernia was present. ?                          The exam of the esophagus was otherwise normal. ?                          A guidewire was placed and the scope was withdrawn.  ?                          Empiric dilation was performed in the entire  ?  esophagus with a Savary dilator with mild  ?                          resistance at 15 mm and then 17 mm (initially  ?                          passage of 33m felt tight, so started with 143m ?                          first). [place]ook endoscopy showed appropriate small  ?                          mucosal wrent just inferior to the UES. ?                          The entire examined stomach was normal. ?                          The duodenal bulb and second portion of the  ?                          duodenum were normal. ?Complications:            No immediate complications. Estimated blood loss:  ?                          Minimal. ?Estimated Blood Loss:     Estimated blood loss was minimal. ?Impression:               - Esophagogastric landmarks identified. ?                          - 1 cm hiatal hernia. ?                          - Normal esophagus otherwise ?                          - Dilation of the esophagus with 1553mnd 49m44m                          dilator as outlined with good result ?                           - Normal stomach. ?                          - Normal duodenal bulb and second portion of the  ?                          duodenum. ?Recommendation:           - Patient has a contact number available for  ?                          emergencies. The signs and symptoms of potential  ?  delayed complications were discussed with the  ?                          patient. Return to normal activities tomorrow.  ?                          Written discharge instructions were provided to the  ?                          patient. ?                          - Post dilation diet ?                          - Continue present medications. ?                          - Follow up as needed for dysphagia ?Remo Lipps P. Donyea Gafford, MD ?03/22/2022 3:28:07 PM ?This report has been signed electronically. ?

## 2022-03-22 NOTE — Telephone Encounter (Signed)
MRI pelvis order in epic. Secure staff message sent to radiology scheduling to contact pt to set up her appt. ?

## 2022-03-22 NOTE — Progress Notes (Signed)
1513 Ephedrine 10 mg given IV due to low BP, MD updated.   ?

## 2022-03-22 NOTE — Progress Notes (Signed)
Report given to PACU, vss 

## 2022-03-23 ENCOUNTER — Other Ambulatory Visit: Payer: Self-pay

## 2022-03-23 DIAGNOSIS — R748 Abnormal levels of other serum enzymes: Secondary | ICD-10-CM

## 2022-03-23 NOTE — Telephone Encounter (Signed)
Returned call to Kathlee Nations at [employer], she was wanting to know if we had an auth for the MRI. I told her that the pre-cert team usually starts that auth once the appt has been scheduled. She will work on getting pt scheduled for next week to allow time to receive auth. Kathlee Nations had no other concerns at the end of the call. ?

## 2022-03-23 NOTE — Telephone Encounter (Signed)
Inbound call from Kathlee Nations wanting to scheduled patients MRI. Stated that she needs patients  authorization number. Please advise. ? ?[phone]   ?

## 2022-03-24 ENCOUNTER — Telehealth: Payer: Self-pay

## 2022-03-24 ENCOUNTER — Telehealth: Payer: Self-pay | Admitting: *Deleted

## 2022-03-24 NOTE — Telephone Encounter (Signed)
?  Follow up Call- ? ? ?  03/22/2022  ?  1:45 PM 04/26/2021  ? 11:24 AM  ?Call back number  ?Post procedure Call Back phone  # [phone]  ?Permission to leave phone message Yes Yes  ?  ? ?First follow up call, LVM ? ? ?

## 2022-03-24 NOTE — Telephone Encounter (Signed)
?  Follow up Call- ? ? ?  03/22/2022  ?  1:45 PM 04/26/2021  ? 11:24 AM  ?Call back number  ?Post procedure Call Back phone  # [phone]  ?Permission to leave phone message Yes Yes  ?  ? ?Patient questions: ? ?Do you have a fever, pain , or abdominal swelling? No. ?Pain Score  0 * ? ?Have you tolerated food without any problems? Yes.   ? ?Have you been able to return to your normal activities? Yes.   ? ?Do you have any questions about your discharge instructions: ?Diet   No. ?Medications  No. ?Follow up visit  No. ? ?Do you have questions or concerns about your Care? No. ? ?Actions: ?* If pain score is 4 or above: ?No action needed, pain <4. ? ? ?

## 2022-03-30 ENCOUNTER — Ambulatory Visit (HOSPITAL_BASED_OUTPATIENT_CLINIC_OR_DEPARTMENT_OTHER)
Admission: RE | Admit: 2022-03-30 | Discharge: 2022-03-30 | Disposition: A | Discharge status: Home / Self Care | Payer: Medicare Other | Source: Ambulatory Visit | Attending: Gastroenterology | Admitting: Gastroenterology

## 2022-03-30 ENCOUNTER — Other Ambulatory Visit (INDEPENDENT_AMBULATORY_CARE_PROVIDER_SITE_OTHER): Payer: Medicare Other

## 2022-03-30 DIAGNOSIS — R748 Abnormal levels of other serum enzymes: Secondary | ICD-10-CM | POA: Diagnosis not present

## 2022-03-30 DIAGNOSIS — R194 Change in bowel habit: Secondary | ICD-10-CM | POA: Insufficient documentation

## 2022-03-30 DIAGNOSIS — K625 Hemorrhage of anus and rectum: Secondary | ICD-10-CM | POA: Diagnosis not present

## 2022-03-30 DIAGNOSIS — R1030 Lower abdominal pain, unspecified: Secondary | ICD-10-CM | POA: Diagnosis not present

## 2022-03-30 DIAGNOSIS — Z90710 Acquired absence of both cervix and uterus: Secondary | ICD-10-CM | POA: Insufficient documentation

## 2022-03-30 LAB — COMPREHENSIVE METABOLIC PANEL
ALT: 8 U/L (ref 0–35)
ALT: 9 U/L (ref 0–35)
AST: 14 U/L (ref 0–37)
AST: 15 U/L (ref 0–37)
Albumin: 3.9 g/dL (ref 3.5–5.2)
Albumin: 3.9 g/dL (ref 3.5–5.2)
Alkaline Phosphatase: 152 U/L — ABNORMAL HIGH (ref 39–117)
Alkaline Phosphatase: 153 U/L — ABNORMAL HIGH (ref 39–117)
BUN: 15 mg/dL (ref 6–23)
BUN: 15 mg/dL (ref 6–23)
CO2: 30 mEq/L (ref 19–32)
CO2: 30 mEq/L (ref 19–32)
Calcium: 9.6 mg/dL (ref 8.4–10.5)
Calcium: 9.7 mg/dL (ref 8.4–10.5)
Chloride: 104 mEq/L (ref 96–112)
Chloride: 104 mEq/L (ref 96–112)
Creatinine, Ser: 1.12 mg/dL (ref 0.40–1.20)
Creatinine, Ser: 1.14 mg/dL (ref 0.40–1.20)
GFR: 46.68 mL/min — ABNORMAL LOW (ref >60.00)
GFR: 47.68 mL/min — ABNORMAL LOW (ref >60.00)
Glucose, Bld: 89 mg/dL (ref 70–99)
Glucose, Bld: 90 mg/dL (ref 70–99)
Potassium: 4.1 mEq/L (ref 3.5–5.1)
Potassium: 4.1 mEq/L (ref 3.5–5.1)
Sodium: 142 mEq/L (ref 135–145)
Sodium: 143 mEq/L (ref 135–145)
Total Bilirubin: 0.4 mg/dL (ref 0.2–1.2)
Total Bilirubin: 0.4 mg/dL (ref 0.2–1.2)
Total Protein: 6.2 g/dL (ref 6.0–8.3)
Total Protein: 6.2 g/dL (ref 6.0–8.3)

## 2022-03-30 LAB — GAMMA GT: GGT: 10 U/L (ref 7–51)

## 2022-03-30 MED ORDER — GADOBUTROL 1 MMOL/ML IV SOLN
6.0000 mL | Freq: Once | INTRAVENOUS | Status: AC | PRN
  Administered 2022-03-30: 6 mL via INTRAVENOUS
  Filled 2022-03-30: qty 6

## 2022-03-30 NOTE — Addendum Note (Signed)
Addended by: Manuela Schwartz on: 03/30/2022 08:00 AM ? ? Modules accepted: Orders ? ?

## 2022-03-30 NOTE — Addendum Note (Signed)
Addended by: Manuela Schwartz on: 03/30/2022 07:59 AM ? ? Modules accepted: Orders ? ?

## 2022-04-02 LAB — ALKALINE PHOSPHATASE, ISOENZYMES
Alkaline Phosphatase: 171 IU/L — ABNORMAL HIGH (ref 44–121)
BONE FRACTION: 51 % (ref 14–68)
INTESTINAL FRAC.: 5 % (ref 0–18)
LIVER FRACTION: 44 % (ref 18–85)

## 2022-04-02 LAB — GAMMA GT: GGT: 11 IU/L (ref 0–60)

## 2022-04-03 ENCOUNTER — Other Ambulatory Visit: Payer: Self-pay

## 2022-04-06 DIAGNOSIS — M810 Age-related osteoporosis without current pathological fracture: Secondary | ICD-10-CM | POA: Diagnosis not present

## 2022-04-06 DIAGNOSIS — M353 Polymyalgia rheumatica: Secondary | ICD-10-CM | POA: Diagnosis not present

## 2022-04-06 DIAGNOSIS — M797 Fibromyalgia: Secondary | ICD-10-CM | POA: Diagnosis not present

## 2022-04-06 DIAGNOSIS — M3501 Sicca syndrome with keratoconjunctivitis: Secondary | ICD-10-CM | POA: Diagnosis not present

## 2022-04-06 DIAGNOSIS — M1990 Unspecified osteoarthritis, unspecified site: Secondary | ICD-10-CM | POA: Diagnosis not present

## 2022-04-11 ENCOUNTER — Encounter: Payer: Self-pay | Admitting: Family Medicine

## 2022-04-11 ENCOUNTER — Ambulatory Visit (INDEPENDENT_AMBULATORY_CARE_PROVIDER_SITE_OTHER): Payer: Medicare Other | Admitting: Family Medicine

## 2022-04-11 VITALS — BP 106/65 | HR 73 | Ht 64.0 in | Wt 129.2 lb

## 2022-04-11 DIAGNOSIS — R748 Abnormal levels of other serum enzymes: Secondary | ICD-10-CM | POA: Diagnosis not present

## 2022-04-11 DIAGNOSIS — M2550 Pain in unspecified joint: Secondary | ICD-10-CM

## 2022-04-11 DIAGNOSIS — R52 Pain, unspecified: Secondary | ICD-10-CM

## 2022-04-11 DIAGNOSIS — M898X9 Other specified disorders of bone, unspecified site: Secondary | ICD-10-CM | POA: Diagnosis not present

## 2022-04-11 MED ORDER — NITROGLYCERIN 0.4 MG SL SUBL: 0.4000 mg | SUBLINGUAL_TABLET | SUBLINGUAL | 1 refills | Status: DC | PRN

## 2022-04-11 MED ORDER — PANTOPRAZOLE SODIUM 40 MG PO TBEC: 40.0000 mg | DELAYED_RELEASE_TABLET | Freq: Every day | ORAL | 1 refills | Status: DC

## 2022-04-11 NOTE — Patient Instructions (Signed)
Discussed case with Dr. Charlett Blake. Follow-up labs with elevated alk phos levels were normal. We can see if insurance will approve a PET scan. If not, let's see if ortho has any further input on the bone/joint and we can keep trending labs. If you develop any new symptoms please let us know.  ?

## 2022-04-11 NOTE — Progress Notes (Signed)
? ?  Acute Office Visit ? ?Subjective:  ? ?  ?Patient ID: [name], female    DOB: 11/16/1946, 76 y.o.   MRN: [id number] ? ?CC: elevated alk phos  ? ? ?HPI ?Patient is in today for elevated alk phos.  ? ?Alk phos has been rising for the past several months. PCP added on GGT and isoenzymes last time which were all normal and recommended 2 month recheck. However, patient states that she saw rheumatology last week they recommended she get seen as soon as possible to further evaluate the rising levels. Patient denies any new symptoms - she continues to have her chronic joint/bone/stomach discomfort. Her worst joint/bone pain is right upper arm, right knee, right lower leg. Reports recent colonoscopy/endoscopy was good.  ? ? ? ? ? ? ?ROS ?All review of systems negative except what is listed in the HPI ? ? ?   ?Objective:  ?  ?BP 106/65   Pulse 73   Ht $R'5\' 4"'Uf$  (1.626 m)   Wt 129 lb 3.2 oz (58.6 kg)   BMI 22.18 kg/m?  ? ? ?Physical Exam ?Vitals reviewed.  ?Constitutional:   ?   Appearance: Normal appearance.  ?HENT:  ?   Head: Normocephalic and atraumatic.  ?Cardiovascular:  ?   Rate and Rhythm: Normal rate and regular rhythm.  ?Pulmonary:  ?   Effort: Pulmonary effort is normal.  ?   Breath sounds: Normal breath sounds.  ?Abdominal:  ?   General: Abdomen is flat. Bowel sounds are normal. There is no distension.  ?   Palpations: Abdomen is soft. There is no mass.  ?   Tenderness: There is no abdominal tenderness. There is no guarding or rebound.  ?Skin: ?   General: Skin is warm and dry.  ?Neurological:  ?   General: No focal deficit present.  ?   Mental Status: She is alert and oriented to person, place, and time. Mental status is at baseline.  ?Psychiatric:     ?   Mood and Affect: Mood normal.     ?   Behavior: Behavior normal.     ?   Thought Content: Thought content normal.     ?   Judgment: Judgment normal.  ? ? ?No results found for any visits on 04/11/22. ? ? ?   ?Assessment & Plan:  ? ?1. Generalized pain ?2.  Elevated alkaline phosphatase level ?3. Arthralgia, unspecified joint ?4. Bone pain ?Discussed case with Dr. Charlett Blake. Follow-up labs with elevated alk phos levels were normal. We can see if insurance will approve a PET scan. If not, let's see if ortho has any further input on the bone/joint and we can keep trending labs. If you develop any new symptoms please let us know.  ? ?- NM PET (CERIANNA) WHOLE BODY; Future ? ?Return for as scheduled; sooner if needed . ? ? ?I spent 30 minutes dedicated to the care of this patient on the date of this encounter to include pre-visit chart review of prior notes and results, face-to-face time with the patient performing a medically appropriate exam, counseling/education regarding elevated alk phos and history, and post-visit documentation and ordering of PET with consult of supervising physician/PCP.  ? ? ? ?Terrilyn Saver, NP ? ? ?

## 2022-04-12 NOTE — Addendum Note (Signed)
Addended by: Caleen Jobs B on: 04/12/2022 10:39 AM ? ? Modules accepted: Orders ? ?

## 2022-04-20 ENCOUNTER — Other Ambulatory Visit: Payer: Self-pay | Admitting: Family Medicine

## 2022-04-20 DIAGNOSIS — R52 Pain, unspecified: Secondary | ICD-10-CM

## 2022-04-20 DIAGNOSIS — M898X9 Other specified disorders of bone, unspecified site: Secondary | ICD-10-CM

## 2022-04-20 NOTE — Progress Notes (Signed)
PET scan not approved. We suspected this may happen. For now, continue to trend labs and will send ortho referral for further workup of the bone pain per previous discussion with PCP.  ? ?Purcell Nails. Olevia Bowens, DNP, FNP-C ? ?

## 2022-04-24 ENCOUNTER — Ambulatory Visit (HOSPITAL_COMMUNITY): Payer: Medicare Other

## 2022-05-01 DIAGNOSIS — L739 Follicular disorder, unspecified: Secondary | ICD-10-CM | POA: Diagnosis not present

## 2022-05-01 DIAGNOSIS — L728 Other follicular cysts of the skin and subcutaneous tissue: Secondary | ICD-10-CM | POA: Diagnosis not present

## 2022-05-05 ENCOUNTER — Other Ambulatory Visit: Payer: Self-pay | Admitting: Cardiology

## 2022-05-08 DIAGNOSIS — R159 Full incontinence of feces: Secondary | ICD-10-CM | POA: Diagnosis not present

## 2022-05-25 ENCOUNTER — Other Ambulatory Visit: Payer: Self-pay | Admitting: Family Medicine

## 2022-05-25 DIAGNOSIS — M353 Polymyalgia rheumatica: Secondary | ICD-10-CM | POA: Diagnosis not present

## 2022-05-25 DIAGNOSIS — M1990 Unspecified osteoarthritis, unspecified site: Secondary | ICD-10-CM | POA: Diagnosis not present

## 2022-05-25 DIAGNOSIS — M810 Age-related osteoporosis without current pathological fracture: Secondary | ICD-10-CM | POA: Diagnosis not present

## 2022-05-25 DIAGNOSIS — M3501 Sicca syndrome with keratoconjunctivitis: Secondary | ICD-10-CM | POA: Diagnosis not present

## 2022-05-30 ENCOUNTER — Other Ambulatory Visit: Payer: Self-pay | Admitting: Family Medicine

## 2022-05-30 DIAGNOSIS — Z1231 Encounter for screening mammogram for malignant neoplasm of breast: Secondary | ICD-10-CM

## 2022-06-01 DIAGNOSIS — N3020 Other chronic cystitis without hematuria: Secondary | ICD-10-CM | POA: Diagnosis not present

## 2022-06-01 DIAGNOSIS — R102 Pelvic and perineal pain: Secondary | ICD-10-CM | POA: Diagnosis not present

## 2022-06-05 ENCOUNTER — Ambulatory Visit: Payer: Medicare Other

## 2022-06-07 ENCOUNTER — Encounter: Payer: Self-pay | Admitting: Family Medicine

## 2022-06-07 ENCOUNTER — Ambulatory Visit (INDEPENDENT_AMBULATORY_CARE_PROVIDER_SITE_OTHER): Payer: Medicare Other | Admitting: Family Medicine

## 2022-06-07 VITALS — BP 95/49 | HR 67 | Ht 64.0 in | Wt 130.2 lb

## 2022-06-07 DIAGNOSIS — M545 Low back pain, unspecified: Secondary | ICD-10-CM

## 2022-06-07 DIAGNOSIS — G629 Polyneuropathy, unspecified: Secondary | ICD-10-CM

## 2022-06-07 DIAGNOSIS — R2241 Localized swelling, mass and lump, right lower limb: Secondary | ICD-10-CM | POA: Diagnosis not present

## 2022-06-07 DIAGNOSIS — R748 Abnormal levels of other serum enzymes: Secondary | ICD-10-CM

## 2022-06-07 LAB — CBC WITH DIFFERENTIAL/PLATELET
Basophils Absolute: 0.1 10*3/uL (ref 0.0–0.1)
Basophils Relative: 0.8 % (ref 0.0–3.0)
Eosinophils Absolute: 0.3 10*3/uL (ref 0.0–0.7)
Eosinophils Relative: 3 % (ref 0.0–5.0)
HCT: 37.8 % (ref 36.0–46.0)
Hemoglobin: 11.9 g/dL — ABNORMAL LOW (ref 12.0–15.0)
Lymphocytes Relative: 15.7 % (ref 12.0–46.0)
Lymphs Abs: 1.7 10*3/uL (ref 0.7–4.0)
MCHC: 31.4 g/dL (ref 30.0–36.0)
MCV: 91 fl (ref 78.0–100.0)
Monocytes Absolute: 0.7 10*3/uL (ref 0.1–1.0)
Monocytes Relative: 7 % (ref 3.0–12.0)
Neutro Abs: 7.9 10*3/uL — ABNORMAL HIGH (ref 1.4–7.7)
Neutrophils Relative %: 73.5 % (ref 43.0–77.0)
Platelets: 360 10*3/uL (ref 150.0–400.0)
RBC: 4.15 Mil/uL (ref 3.87–5.11)
RDW: 16.5 % — ABNORMAL HIGH (ref 11.5–15.5)
WBC: 10.7 10*3/uL — ABNORMAL HIGH (ref 4.0–10.5)

## 2022-06-07 LAB — COMPREHENSIVE METABOLIC PANEL
ALT: 7 U/L (ref 0–35)
AST: 14 U/L (ref 0–37)
Albumin: 4 g/dL (ref 3.5–5.2)
Alkaline Phosphatase: 159 U/L — ABNORMAL HIGH (ref 39–117)
BUN: 13 mg/dL (ref 6–23)
CO2: 29 mEq/L (ref 19–32)
Calcium: 9.6 mg/dL (ref 8.4–10.5)
Chloride: 103 mEq/L (ref 96–112)
Creatinine, Ser: 1.1 mg/dL (ref 0.40–1.20)
GFR: 48.66 mL/min — ABNORMAL LOW (ref >60.00)
Glucose, Bld: 98 mg/dL (ref 70–99)
Potassium: 5.2 mEq/L — ABNORMAL HIGH (ref 3.5–5.1)
Sodium: 138 mEq/L (ref 135–145)
Total Bilirubin: 0.4 mg/dL (ref 0.2–1.2)
Total Protein: 6.7 g/dL (ref 6.0–8.3)

## 2022-06-07 NOTE — Progress Notes (Signed)
Acute Office Visit  Subjective:     Patient ID: [name], female    DOB: [birth year], 76 y.o.   MRN: [id number]  CC:not feeling well, elevated alk phos   HPI Patient is in today for rising alk phos and generally not feeling well.  Last seen in our office on 04/11/22 for rising alk phos levels after rheumatology recommended she follow-up with PCP for this. Case was discussed with PCP and we attempted a PET scan, but given normal isoenzymes and other vague symptoms it was denied and she was referred to ortho to further workup generalized pain.  05/25/22 saw Rheumatology and was told to follow-up with PCP again- notes not yet available from visit (abnormal labs below, CMA to abstract into chart): WBC 14.2 Platelets 445 Lymphocytes 15.4 Neutrophils 10.7 Monocytes 1.1 Potassium 5.6 Cr 1.24 Alk Phos 199 GFR 42   Today patient reports she has gradually been feeling worse over the past few weeks.  - She feels like her neuropathy is flaring up. She was scheduled to see neuro later this month, but the appointment is at [facility] and this has become inconvenient for her so she would like a referral to a local team.  - Reports that she continues to hurt all over and has difficult sleeping due to the various bone/joint pains. She saw ortho recently, but did not seem pleased by their input/workup.She reports they told her there was nothing else they could do and suggested she consider surgery for scoliosis.  - Reports she has a mass/lump to her right upper leg which she was told in the past was a "fat cell," but she is concerned because it seems to be growing and is now slightly tender - she would like imaging to confirm it is benign.     ROS All review of systems negative except what is listed in the HPI      Objective:    BP (!) 95/49   Pulse 67   Ht $R'5\' 4"'eK$  (1.626 m)   Wt 130 lb 3.2 oz (59.1 kg)   BMI 22.35 kg/m    Physical Exam Vitals reviewed.  Constitutional:      Appearance:  Normal appearance.  Cardiovascular:     Rate and Rhythm: Normal rate and regular rhythm.  Pulmonary:     Effort: Pulmonary effort is normal.     Breath sounds: Normal breath sounds.  Skin:    General: Skin is warm and dry.     Findings: No erythema.     Comments: Small mass palpable to right upper leg, soft, mobile, consistent with lipoma however she is reporting mild tenderness to palpation  Neurological:     General: No focal deficit present.     Mental Status: She is alert and oriented to person, place, and time. Mental status is at baseline.  Psychiatric:        Mood and Affect: Mood normal.        Behavior: Behavior normal.        Thought Content: Thought content normal.        Judgment: Judgment normal.         Assessment & Plan:   1. Low back pain, unspecified back pain laterality, unspecified chronicity, unspecified whether sciatica present Keep following with your orthopedic doctor - sending you a handout of low back stretches to try for some relief.   2. Elevated alkaline phosphatase level Updating labs today to compare to results from rheumatology a few weeks ago.  Will discuss with PCP regarding next steps. Keep upcoming appointment with her next month.  - Comprehensive metabolic panel - Alkaline phosphatase, isoenzymes  3. Mass of right lower extremity Ordering an US of the knot on your lung to confirm this is just a lipoma (benign mass, fat accumulation). We will update you with results.  - CBC with Differential/Platelet - [place] RT LOWER EXTREM LTD SOFT TISSUE NON VASCULAR  4. Neuropathy Requesting new referral to a local neurologist as [facility] has become inconvenient for her.  - Ambulatory referral to Neurology   Blood pressure is soft today, but not dropping and no major symptoms at this time. Take it easy and make sure you are drinking plenty of fluids. We will check labs today and let you know results. If you start feeling dizzy, lightheaded, short of breath,  confused, etc then you need to go to the hospital for IV fluids.    Return for ; pending labs; keep upcoming PCP appointment .  Terrilyn Saver, NP

## 2022-06-07 NOTE — Patient Instructions (Signed)
Blood pressure is soft today, but not dropping and no major symptoms at this time. Take it easy and make sure you are drinking plenty of fluids. We will check labs today and let you know results. If you start feeling dizzy, lightheaded, short of breath, confused, etc then you need to go to the hospital for IV fluids.   Updating labs today to compare to the ones you just had done at rheumatology.   Ordering an US of the knot on your lung to confirm this is just a lipoma (benign mass, fat accumulation). We will update you with results.   Neurology referral placed for a more local provider.   Keep following with your orthopedic doctor - sending you a handout of low back stretches to try for some relief.   Keep all other specialist appointments.

## 2022-06-08 NOTE — Therapy (Unsigned)
OUTPATIENT PHYSICAL THERAPY FEMALE PELVIC EVALUATION   Patient Name: [name] MRN: [id number] DOB:Feb 07, 1946, 76 y.o., female Today's Date: 06/12/2022   PT End of Session - 06/12/22 1027     Visit Number 1    Date for PT Re-Evaluation 09/04/22    Authorization Type [employer] medicare    Authorization - Visit Number 1    Authorization - Number of Visits 10    PT Start Time 0920    PT Stop Time 1010    PT Time Calculation (min) 50 min    Activity Tolerance Patient tolerated treatment well    Behavior During Therapy WFL for tasks assessed/performed             Past Medical History:  Diagnosis Date   Allergy    Anemia    Anxiety    past hx    Arthritis    Asthma    Atrial flutter ([place])    past history- not current   Breast cancer ([place])    CAD (coronary artery disease) CARDIOLOGIST--  DR Angelena Form   mild non-obstructive cad   Cancer ([place])    right   Cataract    bilaterally removed    Chronic constipation    Chronic kidney disease    stage 3 ckd, interstitial cystitis   Concussion    x 3   COPD (chronic obstructive pulmonary disease) (HCC)    Depression    past hx    Family history of malignant hyperthermia    father had this   Fibromyalgia    Frequency of urination    GERD (gastroesophageal reflux disease)    H/O hiatal hernia    History of basal cell carcinoma excision    X2   History of breast cancer ONCOLOGIST-- DR Jana Hakim---  NO RECURRANCE   DX 07/2012;  LOW GRADE DCIS  ER+PR+  ----  S/P RIGHT LUMPECTOMY WITH NEGATIVE MARGINS/   RADIATION ENDED 11/2012   History of chronic bronchitis    History of colonic polyps    History of tachycardia    CONTROLLED  WITH ATENOLOL   Hyperlipidemia    Hypertension    Neuromuscular disorder (HCC)    fibromyalgia   Neuropathy    Osteoporosis 01/2019   T score -2.2 stable/improved from prior study   Pelvic pain    Personal history of radiation therapy    S/P radiation therapy 11/12/12 - 12/05/12   right Breast    Sepsis ([place]) 2014   from UTI    Sinus headache    Sjogren's disease ([place])    Urgency of urination    Past Surgical History:  Procedure Laterality Date   50 HOUR [place] STUDY N/A 04/03/2016   Procedure: 24 HOUR [place] STUDY;  Surgeon: Manus Gunning, MD;  Location: [facility];  Service: Gastroenterology;  Laterality: N/A;   BREAST BIOPSY Right 08/23/2012   ADH   BREAST BIOPSY Right 10/11/2012   Ductal Carcinoma   BREAST EXCISIONAL BIOPSY Right 09/04/2013   benign   BREAST LUMPECTOMY Right 10-11-2012   W/ SLN BX   CARDIAC CATHETERIZATION  09-13-2007  DR Lia Foyer   WELL-PRESERVED LVF/  DIFFUSE SCATTERED CORONARY CALCIFACATION AND ATHEROSCLEROSIS WITHOUT OBSTRUCTION   CARDIAC CATHETERIZATION  08-04-2010  DR MCALHANY   NON-OBSTRUCTIVE CAD/  pLAD 40%/  oLAD 30%/  mLAD 30%/  pRCA 30%/  EF 60%   CARDIOVASCULAR STRESS TEST  06-18-2012  DR McALHANY   LOW RISK NUCLEAR STUDY/  SMALL FIXED AREA OF MODERATELY DECREASED UPTAKE  IN ANTEROSEPTAL WALL WHICH MAY BE ARTIFACTUAL/  NO ISCHEMIA/  EF 68%   COLONOSCOPY  09-29-2010   CYSTOSCOPY     CYSTOSCOPY WITH HYDRODISTENSION AND BIOPSY N/A 03/06/2014   Procedure: CYSTOSCOPY/HYDRODISTENSION/ INSTILATION OF MARCAINE AND PYRIDIUM;  Surgeon: Ailene Rud, MD;  Location: [facility];  Service: Urology;  Laterality: N/A;   ESOPHAGEAL MANOMETRY N/A 04/03/2016   Procedure: ESOPHAGEAL MANOMETRY (EM);  Surgeon: Manus Gunning, MD;  Location: [facility];  Service: Gastroenterology;  Laterality: N/A;   EXTRACORPOREAL SHOCK WAVE LITHOTRIPSY Left 02/06/2019   Procedure: EXTRACORPOREAL SHOCK WAVE LITHOTRIPSY (ESWL);  Surgeon: Kathie Rhodes, MD;  Location: [facility];  Service: Urology;  Laterality: Left;   NASAL SINUS SURGERY  1985   ORIF RIGHT ANKLE  FX  2006   POLYPECTOMY     REMOVAL VOCAL CORD CYST  FEB 2014   RIGHT BREAST BX  08-23-2012   RIGHT HAND SURGERY  X3  LAST ONE 2009   INCLUDES  ORIF RIGHT 5TH FINGER AND REVISION TWICE   SKIN  CANCER EXCISION     TONSILLECTOMY AND ADENOIDECTOMY  AGE 26   TOTAL ABDOMINAL HYSTERECTOMY W/ BILATERAL SALPINGOOPHORECTOMY  1982   W/  APPENDECTOMY   TRANSTHORACIC ECHOCARDIOGRAM  06-24-2012   GRADE I DIASTOLIC DYSFUNCTION/  EF 55-60%/  MILD MR   UPPER GASTROINTESTINAL ENDOSCOPY     Patient Active Problem List   Diagnosis Date Noted   Chronic renal insufficiency 03/21/2022   Sjogren's disease ([place]) 12/12/2021   Conjunctivitis 05/31/2021   Vocal cord cyst 05/18/2021   High serum vitamin D 05/18/2021   Dysuria 03/09/2021   Vitamin B12 deficiency 03/08/2021   Preventative health care 03/08/2021   Hyperglycemia 03/08/2021   Low back pain 09/08/2020   OSA and COPD overlap syndrome ([place]) 06/10/2020   Serotonin withdrawal syndrome without complication 16/09/9603   Recurrent falls 05/02/2020   Statin intolerance 02/29/2020   RLS (restless legs syndrome) 11/09/2019   Kidney stone 02/26/2019   Recurrent sinusitis 02/25/2019   Hx of colonic polyp 02/25/2019   DDD (degenerative disc disease), cervical 09/17/2018   Degenerative scoliosis 04/22/2018   Primary insomnia 06/25/2017   Chronic interstitial cystitis 02/01/2017   Chronic cough 01/09/2017   Right arm pain 07/20/2016   Deviated septum 05/12/2016   Nasal turbinate hypertrophy 05/12/2016   Dysphagia    Allergic rhinitis 01/25/2016   Other and combined forms of senile cataract 07/15/2015   Dyspnea    Left hip pain 04/30/2015   Sciatica 04/30/2015   Knee pain, right 04/30/2015   Bilateral carpal tunnel syndrome 03/04/2015   Hyperlipidemia 03/01/2015   Pulmonary nodules 02/12/2015   External hemorrhoids 02/05/2015   Chronic night sweats 01/08/2015   Atypical chest pain 01/01/2015   Renal insufficiency 07/16/2014   Memory loss 05/22/2014   Peripheral neuropathy 05/22/2014   Abdominal pain 10/26/2013   Headache 10/13/2013   Arthralgia 08/18/2013   ANA positive 07/29/2013   Depression 02/05/2013   Family history of  malignant hyperthermia 02/05/2013   Malignant neoplasm of lower-outer quadrant of right breast of female, estrogen receptor positive ([place]) 01/03/2013   Splenic lesion 09/02/2012   Asthma, allergic 08/05/2012   GERD (gastroesophageal reflux disease) 06/03/2012   Edema 04/08/2012   Stricture and stenosis of esophagus 10/19/2011   Functional constipation 07/21/2011   Nonspecific (abnormal) findings on radiological and other examination of biliary tract 07/21/2011   Osteoporosis 04/17/2011   Leg pain 02/24/2011   FIBROCYSTIC BREAST DISEASE, HX OF 10/18/2010   Essential hypertension  08/25/2010   CAD in native artery 08/25/2010   TOBACCO ABUSE, HX OF 08/25/2010   GENERALIZED ANXIETY DISORDER 08/03/2010   Fibromyalgia 01/11/2010    PCP: Mosie Lukes, MD  REFERRING PROVIDER: Ileana Roup, MD  REFERRING DIAG: R15.9 (ICD-10-CM) - Full incontinence of feces  THERAPY DIAG:  Muscle weakness (generalized)  Other lack of coordination  Generalized abdominal pain  Rationale for Evaluation and Treatment Rehabilitation  ONSET DATE: 2019  SUBJECTIVE:                                                                                                                                                                                           SUBJECTIVE STATEMENT: I have bubbles in the vagina. The doctors were looking for a fistula. The determined she has fecal incontinence. When she has the urge to have a bowel movement and unable to hold it. Had her carpet cleaned 2 times due to the fecal leakage.  Fluid intake: Yes: water all day     PAIN:  Are you having pain? Yes NPRS scale: 5/10 Pain location:  abdomen  Pain type: stabbing, hot Pain description: constant   Aggravating factors: when stomach is bloated, touching the abdomen Relieving factors: when completely empty from stool,  PAIN:  Are you having pain? Yes: NPRS scale: 6/10 Pain location: low back Pain description:  intermittent Aggravating factors: movement ,standing up, supine to sitting Relieving factors: laying down   PRECAUTIONS: Other: Breast cancer with radiation  WEIGHT BEARING RESTRICTIONS No  FALLS:  Has patient fallen in last 6 months? No, cane is helping  LIVING ENVIRONMENT: Lives with: lives with their family  OCCUPATION: retired  PLOF: Independent  PATIENT GOALS reduce leakage and abdominal pain, increase strength of anal sphincter  PERTINENT HISTORY:  Breast cancer 07/2012 with radiation 11/12/2012-12.12.2013, CAD (coronary artery disease); Chronic kidney disease stage 3; Fibromyalgia; Sjogren's disease ; Osteoporosis; TOTAL ABDOMINAL HYSTERECTOMY W/ BILATERAL SALPINGOOPHORECTOMY 1982   BOWEL MOVEMENT Pain with bowel movement: Yes, due to hemorrhoids that bleed and are painful Type of bowel movement:Type (Bristol Stool Scale) Type 1, 4, 5, 7, Frequency 1-5 days, and Strain Yes by holding abdomen and leaning forward.  Fully empty rectum: No Leakage: Yes: 1 accident per day , gets fecal urgency Pads: Yes: 1 depends per day Fiber supplement: Yes: miralax  URINATION Pain with urination: Yes, due to the UTI Fully empty bladder: No, push pushes the suprapubic area and get 1/4 of a teacup Stream: Strong and Weak Urgency: yes when she is out  Frequency: urinate every 2-3 hours Leakage: Urge to void, Walking to the bathroom, Coughing, Sneezing, Laughing, Lifting, and  Bending forward Pads: Yes: 1 depends per day  INTERCOURSE Pain with intercourse: Initial Penetration, uses moisturizers around the vaginal canal Ability to have vaginal penetration:  No Climax: has not had a climax for a long time Marinoff Scale: 3/3  PREGNANCY Vaginal deliveries 3 Tearing episiotomy 3 times  PROLAPSE None    OBJECTIVE:    PATIENT SURVEYS:  PFIQ-7 171, UIQ-7 57, CRAIQ-7 52, POPIQ-7 62  COGNITION:  Overall cognitive status: Within functional limits for tasks  assessed       GAIT: Distance walked: walks with a cane slowly Assistive device utilized: Single point cane Level of assistance: Complete Independence               POSTURE: decreased lumbar lordosis, scoliosis, left ilium higher than right   PELVIC ALIGNMENT:  LUMBARAROM/PROM  A/PROM A/PROM  eval  Flexion Decreased by 50%  Extension Decreased by 50%  Right lateral flexion Decreased by 50%  Left lateral flexion Decreased by 50%  Right rotation Decreased by 50%  Left rotation Decreased by 50%   (Blank rows = not tested)  LOWER EXTREMITY ROM: bilateral hip ROM is full.     LOWER EXTREMITY MMT:  MMT Right eval Left eval  Hip flexion 4/5 4/5  Hip extension 4/5 4/5  Hip abduction 4/5 4/5    PALPATION:   General  tenderness throughout the abdomen                External Perineal Exam labia minora is fused to the labia majora,                              Internal Pelvic Floor redness located on the bottom half of the urethra, tenderness located on levator ani and obturator internist, along the right side of the introitus.   Patient confirms identification and approves PT to assess internal pelvic floor and treatment Yes  PELVIC MMT:   MMT eval  Vaginal 3/5  Internal Anal Sphincter 2/5  External Anal Sphincter 2/5  Puborectalis 2/5  (Blank rows = not tested) No emotional/communication barriers or cognitive limitation. Patient is motivated to learn. Patient understands and agrees with treatment goals and plan. PT explains patient will be examined in standing, sitting, and lying down to see how their muscles and joints work. When they are ready, they will be asked to remove their underwear so PT can examine their perineum. The patient is also given the option of providing their own chaperone as one is not provided in our facility. The patient also has the right and is explained the right to defer or refuse any part of the evaluation or treatment including the internal  exam. With the patient's consent, PT will use one gloved finger to gently assess the muscles of the pelvic floor, seeing how well it contracts and relaxes and if there is muscle symmetry. After, the patient will get dressed and PT and patient will discuss exam findings and plan of care. PT and patient discuss plan of care, schedule, attendance policy and HEP activities.        TONE: Increased tone vaginally  PROLAPSE: none  TODAY'S TREATMENT  EVAL Date:  HEP established-see below  Education on vaginal moisturizers Pelvic floor contraction    PATIENT EDUCATION:  06/12/2022 Education details: education on vaginal moisturizers, gave 2 samples of moisturizers, Access Code: [email or web address] Person educated: Patient Education method: Explanation, Demonstration, Tactile cues, Verbal cues, and Handouts Education comprehension: verbalized  understanding, returned demonstration, verbal cues required, tactile cues required, and needs further education   HOME EXERCISE PROGRAM: 06/12/2022 Access Code: [email or web address] URL: [email or web address] Date: 06/12/2022 Prepared by: Earlie Counts  Exercises - Supine Pelvic Floor Contraction  - 3 x daily - 7 x weekly - 1 sets - 10 reps - 5 sec hold  ASSESSMENT:  CLINICAL IMPRESSION: Patient is a 76 y.o. female who was seen today for physical therapy evaluation and treatment for fecal incontinence.  Patient reports abdominal pain and fecal leakage since 2019. Patient reports her fecal leakage came on suddenly. She wears 1 depends per day and has an average of 1 leak per day. Patient will have a bowel movement 1-5 days with Type 1, 4, 5, 7. She takes Miralax. She has pain with bowel movements and urination. Patient will hold her abdomen and lean forward to have a bowel movement and urinate. Patient leaks urine with urge to void, walking to the bathroom, coughing, sneezing, laughing, lifting, and bending forward. Patient does not fully empty her bladder and  will have to press on her abdomen to get more urine out. Abdominal pain is constant at level 5/10. Her back pain is intermittent at level 6/10 with activity. Lumbar ROM is limited by 50%. Bilateral hip strength is 4/5. She has tenderness throughout her abdomen, levator ani and obturator internist. Patient anal strength is 2/5 with tenderness in the rectum. Her vaginal strength is 3/5 with tenderness along the left side of the introitus. Posterior urethra is red and tender. Patient does not have penile penetration due to pain and dryness. Patient will benefit from skilled therapy to improve pelvic floor coordination, reduce pain and improve leakage.    OBJECTIVE IMPAIRMENTS decreased activity tolerance, decreased coordination, decreased endurance, decreased strength, increased fascial restrictions, increased muscle spasms, impaired tone, and pain.   ACTIVITY LIMITATIONS lifting, bending, continence, and toileting  PARTICIPATION LIMITATIONS: cleaning, interpersonal relationship, driving, and community activity  PERSONAL FACTORS Breast cancer 07/2012 with radiation 11/12/2012-12.12.2013, CAD (coronary artery disease); Chronic kidney disease stage 3; Fibromyalgia; Sjogren's disease ; Osteoporosis; TOTAL ABDOMINAL HYSTERECTOMY W/ BILATERAL SALPINGOOPHORECTOMY 1982 are also affecting patient's functional outcome.   REHAB POTENTIAL: Excellent  CLINICAL DECISION MAKING: Evolving/moderate complexity  EVALUATION COMPLEXITY: Moderate   GOALS: Goals reviewed with patient? Yes  SHORT TERM GOALS: Target date: 07/10/2022  Patient educated on vaginal moisturizers and lubricant to improve vaginal health.  Baseline: Goal status: INITIAL  2.  Patient independent with initial HEP for pelvic floor relaxation, contraction and abdominal work.  Baseline:  Goal status: INITIAL  3.  Patient reports her abdominal pain si </= 25%.  Baseline:  Goal status: INITIAL   LONG TERM GOALS: Target date: 09/04/2022    Patient independent with advanced HEP for pelvic floor, core, and hip strength.  Baseline:  Goal status: INITIAL  2.  Patient reports her urinary leakage has improved >/= 75% due to improved strength and reduction of pain.  Baseline:  Goal status: INITIAL  3.  Patient reports her fecal leakage has improved >/= 75% due to increased in rectal strength.  Baseline:  Goal status: INITIAL  4.  Patient reports her abdominal pain has improved >/= 50%.  Baseline:  Goal status: INITIAL  5.  Patient is able to have penile penetration with 50% less pain due to improve vaginal moisture and reduction of trigger points in the pelvic floor.  Baseline:  Goal status: INITIAL   PLAN: PT FREQUENCY: 1x/week  PT DURATION: 12 weeks  PLANNED INTERVENTIONS: Therapeutic exercises,  Therapeutic activity, Neuromuscular re-education, Patient/Family education, Joint mobilization, Electrical stimulation, Cryotherapy, Moist heat, Biofeedback, and Manual therapy  PLAN FOR NEXT SESSION: review vaginal moisturizers, manual work to abdomen and education on abdominal massage, diaphragmatic breathing   Earlie Counts, PT 06/12/22 10:58 AM

## 2022-06-11 ENCOUNTER — Other Ambulatory Visit: Payer: Self-pay | Admitting: Family Medicine

## 2022-06-11 LAB — ALKALINE PHOSPHATASE, ISOENZYMES
Alkaline Phosphatase: 177 IU/L — ABNORMAL HIGH (ref 44–121)
BONE FRACTION: 45 % (ref 14–68)
INTESTINAL FRAC.: 1 % (ref 0–18)
LIVER FRACTION: 54 % (ref 18–85)

## 2022-06-12 ENCOUNTER — Encounter: Payer: Self-pay | Admitting: Physical Therapy

## 2022-06-12 ENCOUNTER — Ambulatory Visit: Payer: Medicare Other | Attending: Surgery | Admitting: Physical Therapy

## 2022-06-12 DIAGNOSIS — R1084 Generalized abdominal pain: Secondary | ICD-10-CM | POA: Diagnosis not present

## 2022-06-12 DIAGNOSIS — R278 Other lack of coordination: Secondary | ICD-10-CM | POA: Insufficient documentation

## 2022-06-12 DIAGNOSIS — M6281 Muscle weakness (generalized): Secondary | ICD-10-CM | POA: Diagnosis not present

## 2022-06-12 NOTE — Patient Instructions (Signed)
Moisturizers They are used in the vagina to hydrate the mucous membrane that make up the vaginal canal. Designed to keep a more normal acid balance (ph) Once placed in the vagina, it will last between two to three days.  Use 2-3 times per week at bedtime  Ingredients to avoid is glycerin and fragrance, can increase chance of infection Should not be used just before sex due to causing irritation Most are gels administered either in a tampon-shaped applicator or as a vaginal suppository. They are non-hormonal.   Types of Moisturizers(internal use)  Vitamin E vaginal suppositories- Whole foods, Amazon Moist Again Coconut oil- can break down condoms Julva- (Do no use if on Tamoxifen) amazon Yes moisturizer- amazon NeuEve Silk , NeuEve Silver for menopausal or over 65 (if have severe vaginal atrophy or cancer treatments use NeuEve Silk for  1 month than move to [employer])- [employer], [place].com Olive and Bee intimate cream- www.oliveandbee.com.au Mae vaginal moisturizer- Amazon Aloe    Creams to use externally on the Vulva area [employer]'s (good for for cancer patients that had radiation to the area)- [place] or [employer].FlyingBasics.com.br V-magic cream - amazon Julva-amazon Vital "V [place] salve ( help moisturize and help with thinning vulvar area, does have [place] by Irwin Brakeman labial moisturizer (Amazon,  Coconut or olive oil aloe   Things to avoid in the vaginal area Do not use things to irritate the vulvar area No lotions just specialized creams for the vulva area- Neogyn, V-magic, No soaps; can use Aveeno or Calendula cleanser if needed. Must be gentle No deodorants No douches Good to sleep without underwear to let the vaginal area to air out No scrubbing: spread the lips to let warm water rinse over labias and pat dry  [facility] [address], [place], [place] [address] Phone # [phone] Fax [phone]

## 2022-06-13 ENCOUNTER — Other Ambulatory Visit: Payer: Self-pay | Admitting: Family Medicine

## 2022-06-13 DIAGNOSIS — N3020 Other chronic cystitis without hematuria: Secondary | ICD-10-CM | POA: Diagnosis not present

## 2022-06-14 ENCOUNTER — Telehealth: Payer: Self-pay

## 2022-06-14 DIAGNOSIS — Z79891 Long term (current) use of opiate analgesic: Secondary | ICD-10-CM | POA: Diagnosis not present

## 2022-06-14 DIAGNOSIS — M5136 Other intervertebral disc degeneration, lumbar region: Secondary | ICD-10-CM | POA: Diagnosis not present

## 2022-06-14 DIAGNOSIS — M5459 Other low back pain: Secondary | ICD-10-CM | POA: Diagnosis not present

## 2022-06-14 DIAGNOSIS — M5030 Other cervical disc degeneration, unspecified cervical region: Secondary | ICD-10-CM | POA: Diagnosis not present

## 2022-06-14 NOTE — Telephone Encounter (Signed)
[facility] called stating that there is a possible drug interaction between Trazodone and Uribel. Interaction could cause serotonin drop. Uribel was Rx'd by Urology. Pt told the pharmacy that she has only been taking the Uribel twice a day instead of three. Pt states having muscle weakness but that could be due to her fibromyalgia. Pharmacy would like to know what you would like the patient to do? Please advise

## 2022-06-16 ENCOUNTER — Other Ambulatory Visit: Payer: Self-pay | Admitting: Family Medicine

## 2022-06-16 ENCOUNTER — Telehealth: Payer: Self-pay | Admitting: Family Medicine

## 2022-06-16 ENCOUNTER — Ambulatory Visit (HOSPITAL_BASED_OUTPATIENT_CLINIC_OR_DEPARTMENT_OTHER)
Admission: RE | Admit: 2022-06-16 | Discharge: 2022-06-16 | Disposition: A | Discharge status: Home / Self Care | Payer: Medicare Other | Source: Ambulatory Visit | Attending: Family Medicine | Admitting: Family Medicine

## 2022-06-16 DIAGNOSIS — R2241 Localized swelling, mass and lump, right lower limb: Secondary | ICD-10-CM | POA: Insufficient documentation

## 2022-06-19 DIAGNOSIS — M545 Low back pain, unspecified: Secondary | ICD-10-CM

## 2022-06-19 DIAGNOSIS — R2241 Localized swelling, mass and lump, right lower limb: Secondary | ICD-10-CM

## 2022-06-19 MED ORDER — DULOXETINE HCL 30 MG PO CPEP: 30.0000 mg | ORAL_CAPSULE | Freq: Every day | ORAL | 1 refills | Status: DC

## 2022-06-21 ENCOUNTER — Telehealth (HOSPITAL_BASED_OUTPATIENT_CLINIC_OR_DEPARTMENT_OTHER): Payer: Self-pay

## 2022-06-21 NOTE — Addendum Note (Signed)
Addended by: Caleen Jobs B on: 06/21/2022 08:00 AM   Modules accepted: Orders

## 2022-06-28 ENCOUNTER — Ambulatory Visit: Payer: Medicare Other | Admitting: Physical Therapy

## 2022-06-30 DIAGNOSIS — M5416 Radiculopathy, lumbar region: Secondary | ICD-10-CM | POA: Diagnosis not present

## 2022-07-03 ENCOUNTER — Other Ambulatory Visit: Payer: Self-pay | Admitting: Family Medicine

## 2022-07-03 NOTE — Progress Notes (Unsigned)
 Subjective:    Patient ID: [name], female    DOB: 06/17/1946, 76 y.o.   MRN: [id number]  No chief complaint on file.   HPI Patient is in today for a follow up.  Past Medical History:  Diagnosis Date   Allergy    Anemia    Anxiety    past hx    Arthritis    Asthma    Atrial flutter (HCC)    past history- not current   Breast cancer (HCC)    CAD (coronary artery disease) CARDIOLOGIST--  DR MCALHANY   mild non-obstructive cad   Cancer (HCC)    right   Cataract    bilaterally removed    Chronic constipation    Chronic kidney disease    stage 3 ckd, interstitial cystitis   Concussion    x 3   COPD (chronic obstructive pulmonary disease) (HCC)    Depression    past hx    Family history of malignant hyperthermia    father had this   Fibromyalgia    Frequency of urination    GERD (gastroesophageal reflux disease)    H/O hiatal hernia    History of basal cell carcinoma excision    X2   History of breast cancer ONCOLOGIST-- DR MAGRINAT---  NO RECURRANCE   DX 07/2012;  LOW GRADE DCIS  ER+PR+  ----  S/P RIGHT LUMPECTOMY WITH NEGATIVE MARGINS/   RADIATION ENDED 11/2012   History of chronic bronchitis    History of colonic polyps    History of tachycardia    CONTROLLED  WITH ATENOLOL   Hyperlipidemia    Hypertension    Neuromuscular disorder (HCC)    fibromyalgia   Neuropathy    Osteoporosis 01/2019   T score -2.2 stable/improved from prior study   Pelvic pain    Personal history of radiation therapy    S/P radiation therapy 11/12/12 - 12/05/12   right Breast   Sepsis (HCC) 2014   from UTI    Sinus headache    Sjogren's disease (HCC)    Urgency of urination     Past Surgical History:  Procedure Laterality Date   24 HOUR PH STUDY N/A 04/03/2016   Procedure: 24 HOUR PH STUDY;  Surgeon: Steven Paul Armbruster, MD;  Location: [facility];  Service: Gastroenterology;  Laterality: N/A;   BREAST BIOPSY Right 08/23/2012   ADH   BREAST BIOPSY Right 10/11/2012    Ductal Carcinoma   BREAST EXCISIONAL BIOPSY Right 09/04/2013   benign   BREAST LUMPECTOMY Right 10-11-2012   W/ [facility] BX   CARDIAC CATHETERIZATION  09-13-2007  DR STUCKEY   WELL-PRESERVED LVF/  DIFFUSE SCATTERED CORONARY CALCIFACATION AND ATHEROSCLEROSIS WITHOUT OBSTRUCTION   CARDIAC CATHETERIZATION  08-04-2010  DR MCALHANY   NON-OBSTRUCTIVE CAD/  pLAD 40%/  oLAD 30%/  mLAD 30%/  pRCA 30%/  EF 60%   CARDIOVASCULAR STRESS TEST  06-18-2012  DR McALHANY   LOW RISK NUCLEAR STUDY/  SMALL FIXED AREA OF MODERATELY DECREASED UPTAKE IN ANTEROSEPTAL WALL WHICH MAY BE ARTIFACTUAL/  NO ISCHEMIA/  EF 68%   COLONOSCOPY  09-29-2010   CYSTOSCOPY     CYSTOSCOPY WITH HYDRODISTENSION AND BIOPSY N/A 03/06/2014   Procedure: CYSTOSCOPY/HYDRODISTENSION/ INSTILATION OF MARCAINE AND PYRIDIUM;  Surgeon: Sigmund I Tannenbaum, MD;  Location: [facility];  Service: Urology;  Laterality: N/A;   ESOPHAGEAL MANOMETRY N/A 04/03/2016   Procedure: ESOPHAGEAL MANOMETRY (EM);  Surgeon: Steven Paul Armbruster, MD;  Location: [facility];  Service:   Gastroenterology;  Laterality: N/A;   EXTRACORPOREAL SHOCK WAVE LITHOTRIPSY Left 02/06/2019   Procedure: EXTRACORPOREAL SHOCK WAVE LITHOTRIPSY (ESWL);  Surgeon: Kathie Rhodes, MD;  Location: [facility];  Service: Urology;  Laterality: Left;   NASAL SINUS SURGERY  1985   ORIF RIGHT ANKLE  FX  2006   POLYPECTOMY     REMOVAL VOCAL CORD CYST  FEB 2014   RIGHT BREAST BX  08-23-2012   RIGHT HAND SURGERY  X3  LAST ONE 2009   INCLUDES  ORIF RIGHT 5TH FINGER AND REVISION TWICE   SKIN CANCER EXCISION     TONSILLECTOMY AND ADENOIDECTOMY  AGE 68   TOTAL ABDOMINAL HYSTERECTOMY W/ BILATERAL SALPINGOOPHORECTOMY  1982   W/  APPENDECTOMY   TRANSTHORACIC ECHOCARDIOGRAM  06-24-2012   GRADE I DIASTOLIC DYSFUNCTION/  EF 55-60%/  MILD MR   UPPER GASTROINTESTINAL ENDOSCOPY      Family History  Problem Relation Age of Onset   Rectal cancer Mother    Colon cancer Mother    Pancreatic  cancer Mother    Diabetes Mother    Breast cancer Mother 75   Breast cancer Maternal Aunt        breast   Birth defects Maternal Aunt    Irritable bowel syndrome Son    Heart disease Son        CAD, MV replacement   Allergic Disorder Daughter    Diabetes Daughter    Colon cancer Father    Colon polyps Father    Diabetes Father    Stroke Father    Heart disease Father        CHF   Hyperlipidemia Father    Hypertension Father    Arthritis Father    Breast cancer Paternal Aunt    Breast cancer Paternal Aunt    Arthritis Paternal Uncle    Allergic Disorder Daughter    Heart disease Cousin        CAD, had a blood clot after stenting   Esophageal cancer Neg Hx    Stomach cancer Neg Hx     Social History   Socioeconomic History   Marital status: Married    Spouse name: Jori Moll    Number of children: 3   Years of education: BA   Highest education level: Not on file  Occupational History   Occupation: Retired Programmer, multimedia: RETIRED  Tobacco Use   Smoking status: Former    Packs/day: 1.00    Years: 15.00    Total pack years: 15.00    Types: Cigarettes    Quit date: 05/27/2005    Years since quitting: 17.1   Smokeless tobacco: Never  Vaping Use   Vaping Use: Never used  Substance and Sexual Activity   Alcohol use: No    Alcohol/week: 0.0 standard drinks of alcohol   Drug use: No   Sexual activity: Not Currently    Partners: Male    Birth control/protection: Surgical    Comment: 1st intercourse 76 yo-Fewer than 5 partners, hysterectomy  Other Topics Concern   Not on file  Social History Narrative   Lives with husband.   Caffeine use: 1/2 cup per day   Exercise-- 2days a week [employer],  Water aerobics, walking       Retired Marine scientist   No dietary restrictions, tries to maintain a heart healthy diet   Social Determinants of Health   Financial Resource Strain: Low Risk  (02/28/2022)   Overall Financial Resource Strain (CARDIA)    Difficulty  of Paying Living Expenses:  Not hard at all  Food Insecurity: No Food Insecurity (02/28/2022)   Hunger Vital Sign    Worried About Running Out of Food in the Last Year: Never true    Ran Out of Food in the Last Year: Never true  Transportation Needs: No Transportation Needs (02/28/2022)   PRAPARE - Hydrologist (Medical): No    Lack of Transportation (Non-Medical): No  Physical Activity: Not on file  Stress: Not on file  Social Connections: Socially Integrated (02/28/2022)   Social Connection and Isolation Panel [NHANES]    Frequency of Communication with Friends and Family: More than three times a week    Frequency of Social Gatherings with Friends and Family: More than three times a week    Attends Religious Services: More than 4 times per year    Active Member of [employer] or Organizations: Yes    Attends Music therapist: More than 4 times per year    Marital Status: Married  Human resources officer Violence: Not At Risk (02/28/2022)   Humiliation, Afraid, Rape, and Kick questionnaire    Fear of Current or Ex-Partner: No    Emotionally Abused: No    Physically Abused: No    Sexually Abused: No    Outpatient Medications Prior to Visit  Medication Sig Dispense Refill   acetaminophen (TYLENOL) 500 MG tablet Take 1,000 mg by mouth every 6 (six) hours as needed for mild pain or headache.      albuterol (VENTOLIN HFA) 108 (90 Base) MCG/ACT inhaler Inhale 2 puffs into the lungs every 4 (four) hours as needed. 1 each 0   amLODipine (NORVASC) 10 MG tablet Take 1 tablet by mouth once daily 90 tablet 1   aspirin EC 81 MG tablet Take 1 tablet (81 mg total) by mouth daily. 90 tablet 3   atenolol (TENORMIN) 50 MG tablet Take 1 tablet by mouth twice daily 180 tablet 0   Cyanocobalamin (VITAMIN B-12) 5000 MCG TBDP Take 1 tablet by mouth daily with breakfast.     denosumab (PROLIA) 60 MG/ML SOSY injection Inject 60 mg into the skin every 6 (six) months.     DULoxetine (CYMBALTA) 30 MG capsule Take 1  capsule (30 mg total) by mouth daily. 30 capsule 1   Estradiol 10 MCG TABS vaginal tablet Place 1 tablet (10 mcg total) vaginally 2 (two) times a week. 24 tablet 4   fluticasone (FLONASE) 50 MCG/ACT nasal spray Use 2 spray(s) in each nostril once daily 16 g 5   furosemide (LASIX) 20 MG tablet Take 1 tablet (20 mg total) by mouth as needed for edema. 90 tablet 3   gabapentin (NEURONTIN) 300 MG capsule Take by mouth.     hydroxychloroquine (PLAQUENIL) 200 MG tablet 1 tablet with food or milk     losartan (COZAAR) 100 MG tablet Take 1 tablet (100 mg total) by mouth daily. 90 tablet 2   Meth-Hyo-M Bl-Na Phos-Ph Sal (URIBEL) 118 MG CAPS Take by mouth.     mupirocin ointment (BACTROBAN) 2 % Apply thin film twice daily. 22 g 0   nitroGLYCERIN (NITROSTAT) 0.4 MG SL tablet Place 1 tablet (0.4 mg total) under the tongue every 5 (five) minutes as needed for chest pain. 25 tablet 1   nystatin cream (MYCOSTATIN) Apply 1 application topically 2 (two) times daily. 30 g 2   pantoprazole (PROTONIX) 40 MG tablet Take 1 tablet (40 mg total) by mouth daily. 90 tablet 1  pentosan polysulfate (ELMIRON) 100 MG capsule Take 1 capsule (100 mg total) by mouth 3 (three) times daily. Reported on 01/05/2016 (Patient taking differently: Take 100 mg by mouth 3 (three) times daily with meals as needed. Reported on 01/05/2016) 90 capsule 11   polyethylene glycol (MIRALAX) 17 g packet Take 17 g by mouth daily. 14 each 0   REPATHA SURECLICK 140 MG/ML SOAJ INJECT 140 MG SUBCUTANEOUSLY EVERY 14 DAYS 2 mL 11   sucralfate (CARAFATE) 1 GM/10ML suspension Take 10 mLs (1 g total) by mouth 4 (four) times daily -  with meals and at bedtime. 420 mL 1   traZODone (DESYREL) 100 MG tablet TAKE 1 TABLET BY MOUTH AT BEDTIME 90 tablet 0   No facility-administered medications prior to visit.    Allergies  Allergen Reactions   Clindamycin Rash and Shortness Of Breath   Clindamycin Hcl Shortness Of Breath and Rash   Penicillins Anaphylaxis    Rosuvastatin Anaphylaxis   Baclofen Other (See Comments) and Rash   Lincomycin Other (See Comments)   Prednisone Rash    Other reaction(s): Rash, Hives - can tolerate if she takes with benadryl    Clindamycin Hcl     Other reaction(s): Shortness of Breath, Rash   Codeine Hives and Other (See Comments)    headache Other reaction(s): Headache, Nausea, headache   Erythromycin Hives   Erythromycin Base Other (See Comments)    other   Fluzone [Influenza Virus Vaccine] Other (See Comments)    Local reaction at the site   Haemophilus Influenzae Other (See Comments)    Local reaction at the site Local reaction at the site   Haemophilus Influenzae Vaccines Other (See Comments)    Local reaction at site   Latex Hives   Levofloxacin     Other reaction(s): sick   Other Other (See Comments)    Local reaction at site   Pentazocine Other (See Comments)   Pentazocine Lactate Other (See Comments)    HALLUCINATION   Pneumococcal Vaccine Hives and Swelling    Other reaction(s): Hives, Shortness of Breath   Pneumococcal Vaccine Polyvalent Hives, Swelling and Other (See Comments)    REACTION: redness, swelling, and hives at injection site   Tamoxifen Nausea And Vomiting and Other (See Comments)    HEADACHE Other reaction(s): Headache, Nausea    ROS     Objective:    Physical Exam  There were no vitals taken for this visit. Wt Readings from Last 3 Encounters:  06/07/22 130 lb 3.2 oz (59.1 kg)  04/11/22 129 lb 3.2 oz (58.6 kg)  03/22/22 134 lb (60.8 kg)    Diabetic Foot Exam - Simple   No data filed    Lab Results  Component Value Date   WBC 10.7 (H) 06/07/2022   HGB 11.9 (L) 06/07/2022   HCT 37.8 06/07/2022   PLT 360.0 06/07/2022   GLUCOSE 98 06/07/2022   CHOL 144 03/21/2022   TRIG 88.0 03/21/2022   HDL 47.50 03/21/2022   LDLDIRECT 117.3 04/08/2012   LDLCALC 79 03/21/2022   ALT 7 06/07/2022   AST 14 06/07/2022   NA 138 06/07/2022   K 5.2 No hemolysis seen (H)  06/07/2022   CL 103 06/07/2022   CREATININE 1.10 06/07/2022   BUN 13 06/07/2022   CO2 29 06/07/2022   TSH 3.22 03/21/2022   INR 1.05 08/03/2010   HGBA1C 5.8 03/21/2022   MICROALBUR 0.2 07/10/2013    Lab Results  Component Value Date   TSH 3.22 03/21/2022     Lab Results  Component Value Date   WBC 10.7 (H) 06/07/2022   HGB 11.9 (L) 06/07/2022   HCT 37.8 06/07/2022   MCV 91.0 06/07/2022   PLT 360.0 06/07/2022   Lab Results  Component Value Date   NA 138 06/07/2022   K 5.2 No hemolysis seen (H) 06/07/2022   CHLORIDE 102 04/20/2015   CO2 29 06/07/2022   GLUCOSE 98 06/07/2022   BUN 13 06/07/2022   CREATININE 1.10 06/07/2022   BILITOT 0.4 06/07/2022   ALKPHOS 159 (H) 06/07/2022   ALKPHOS 177 (H) 06/07/2022   AST 14 06/07/2022   ALT 7 06/07/2022   PROT 6.7 06/07/2022   ALBUMIN 4.0 06/07/2022   CALCIUM 9.6 06/07/2022   ANIONGAP 12 (H) 04/20/2015   EGFR 49 (L) 04/20/2015   GFR 48.66 (L) 06/07/2022   Lab Results  Component Value Date   CHOL 144 03/21/2022   Lab Results  Component Value Date   HDL 47.50 03/21/2022   Lab Results  Component Value Date   LDLCALC 79 03/21/2022   Lab Results  Component Value Date   TRIG 88.0 03/21/2022   Lab Results  Component Value Date   CHOLHDL 3 03/21/2022   Lab Results  Component Value Date   HGBA1C 5.8 03/21/2022       Assessment & Plan:     Problem List Items Addressed This Visit   None   I am having [name] maintain her acetaminophen, aspirin EC, pentosan polysulfate, denosumab, mupirocin ointment, Estradiol, polyethylene glycol, fluticasone, Vitamin B-12, nystatin cream, sucralfate, Repatha SureClick, albuterol, furosemide, hydroxychloroquine, gabapentin, losartan, nitroGLYCERIN, pantoprazole, amLODipine, atenolol, traZODone, Uribel, and DULoxetine.  No orders of the defined types were placed in this encounter.    

## 2022-07-04 ENCOUNTER — Encounter: Payer: Self-pay | Admitting: Family Medicine

## 2022-07-04 ENCOUNTER — Ambulatory Visit (INDEPENDENT_AMBULATORY_CARE_PROVIDER_SITE_OTHER): Payer: Medicare Other | Admitting: Family Medicine

## 2022-07-04 VITALS — BP 102/70 | HR 83 | Resp 20 | Ht 64.0 in | Wt 134.0 lb

## 2022-07-04 DIAGNOSIS — E538 Deficiency of other specified B group vitamins: Secondary | ICD-10-CM

## 2022-07-04 DIAGNOSIS — D72829 Elevated white blood cell count, unspecified: Secondary | ICD-10-CM

## 2022-07-04 DIAGNOSIS — M810 Age-related osteoporosis without current pathological fracture: Secondary | ICD-10-CM

## 2022-07-04 DIAGNOSIS — R296 Repeated falls: Secondary | ICD-10-CM

## 2022-07-04 DIAGNOSIS — M545 Low back pain, unspecified: Secondary | ICD-10-CM

## 2022-07-04 DIAGNOSIS — R251 Tremor, unspecified: Secondary | ICD-10-CM

## 2022-07-04 DIAGNOSIS — J329 Chronic sinusitis, unspecified: Secondary | ICD-10-CM

## 2022-07-04 DIAGNOSIS — M3500 Sicca syndrome, unspecified: Secondary | ICD-10-CM

## 2022-07-04 DIAGNOSIS — Z789 Other specified health status: Secondary | ICD-10-CM

## 2022-07-04 DIAGNOSIS — I10 Essential (primary) hypertension: Secondary | ICD-10-CM | POA: Diagnosis not present

## 2022-07-04 DIAGNOSIS — R739 Hyperglycemia, unspecified: Secondary | ICD-10-CM | POA: Diagnosis not present

## 2022-07-04 DIAGNOSIS — R1011 Right upper quadrant pain: Secondary | ICD-10-CM

## 2022-07-04 DIAGNOSIS — N289 Disorder of kidney and ureter, unspecified: Secondary | ICD-10-CM | POA: Diagnosis not present

## 2022-07-04 DIAGNOSIS — R109 Unspecified abdominal pain: Secondary | ICD-10-CM

## 2022-07-04 MED ORDER — DOXYCYCLINE HYCLATE 100 MG PO TABS: 100.0000 mg | ORAL_TABLET | Freq: Two times a day (BID) | ORAL | 0 refills | Status: DC

## 2022-07-04 NOTE — Assessment & Plan Note (Signed)
Hydrate and monitor 

## 2022-07-04 NOTE — Patient Instructions (Signed)
Leukocytosis Leukocytosis means that a person has more white blood cells than normal. White blood cells are made in the bone marrow. Bone marrow is the spongy tissue inside bones. The main job of white blood cells is to fight infection. Having too many white blood cells is a common condition. It can develop as a result of many types of medical problems. What are the causes? Leukocytosis may be caused by various conditions. In some cases, the bone marrow is normal but is still making too many white blood cells. This could be the result of: Infection. Injury. Physical stress. Emotional stress. Surgery. Allergic reactions. Certain medicines. Other causes may include: A genetic or inherited disease. Chronic inflammatory conditions. Tumors that start in other areas of the body, but not in the blood or bone marrow. Pregnancy and labor. In other cases, a person may have a bone marrow disorder that is causing the body to make too many white blood cells. Bone marrow disorders include: Leukemia. This is a type of blood cancer. Myeloproliferative disorders. These disorders cause blood cells to grow abnormally. What are the signs or symptoms? Often, this condition causes no symptoms. Some people may have symptoms due to the medical condition that is causing their leukocytosis. These symptoms may include: Bleeding. Bruising. Fever. Night sweats. Weakness. Weight loss. Other symptoms may include: An enlarged spleen. Swollen lymph nodes. Repeated infections. How is this diagnosed? This condition is diagnosed with blood tests. It is often found when blood is tested as part of a routine physical exam. You may have other tests to help determine why you have too many white blood cells. These tests may include: A complete blood count (CBC). This test measures all the types of blood cells in your body. Chest X-rays, urine tests, or other tests to look for signs of infection. Bone marrow aspiration.  For this test, a needle is put into your bone. Cells from the bone marrow are removed through the needle and examined under a microscope. Other tests on the blood or bone marrow sample. CT scan, bone scan, or other imaging tests. How is this treated? Usually, treatment is not needed for leukocytosis. However, if an infection, cancer, bone marrow disorder, or other serious problem is causing your leukocytosis, it will need to be treated. Treatment may include: Regular monitoring of your white blood cell count to look for changes. Antibiotic medicine if you have a bacterial infection. Bone marrow transplant. This treatment replaces your diseased bone marrow with healthy cells that will grow new bone marrow. Chemotherapy or biological therapies such as the use of antibodies. These treatments may be used to kill cancer cells or to decrease the number of white blood cells. Follow these instructions at home: Medicines Take over-the-counter and prescription medicines only as told by your health care provider. If you were prescribed an antibiotic medicine, take it as told by your health care provider. Do not stop taking the antibiotic even if you start to feel better. Eating and drinking  Eat foods that are low in saturated fats and high in fiber. Eat plenty of fruits and vegetables. Drink enough fluid to keep your urine pale yellow. Limit your intake of caffeine and alcohol. General instructions Maintain a healthy weight. Ask your health care provider what weight is best for you. Do 30 minutes of exercise at least 5 times each week. Check with your health care provider before you start a new exercise routine. Follow any safety precautions as told by your health care provider. This   may be needed if your condition causes an increased risk for infection or bleeding. Do not use any products that contain nicotine or tobacco. These products include cigarettes, chewing tobacco, and vaping devices, such as  e-cigarettes. If you need help quitting, ask your health care provider. Keep all follow-up visits. This is important. Contact a health care provider if: You feel weak or more tired than usual. You develop chills, a cough, or nasal congestion. You have a fever. You lose weight without trying. You have night sweats. You bruise easily. You have new or worsening symptoms. Get help right away if: You bleed more than normal or your bleeding is difficult to stop. You have chest pain or trouble breathing. You have nausea or vomiting that does not stop. You feel dizzy or light-headed, or you lose consciousness. These symptoms may be an emergency. Get help right away. Call 911. Do not wait to see if the symptoms will go away. Do not drive yourself to the hospital. Summary Leukocytosis means that a person has more white blood cells than normal. This condition often causes no symptoms. This condition may be caused by various conditions. Keep all follow-up visits. This is important. This information is not intended to replace advice given to you by your health care provider. Make sure you discuss any questions you have with your health care provider. Document Revised: 07/17/2021 Document Reviewed: 07/17/2021 Elsevier Patient Education  2023 [employer] Inc.  

## 2022-07-04 NOTE — Assessment & Plan Note (Addendum)
Is seeing Dr Nelva Bush for her low back pain, had a recent MRI will request a copy, is hoping to avoid surgery for as long as possible. She is willing to take injections and [facility] [employer] and she is trying to exercise and get stronger.

## 2022-07-04 NOTE — Assessment & Plan Note (Addendum)
Does not tolerate statins. Repatha prescribed by cardiology

## 2022-07-04 NOTE — Assessment & Plan Note (Signed)
Follows with Dr Titus Mould of [facility] and is tolerating Plaquenil

## 2022-07-04 NOTE — Assessment & Plan Note (Signed)
Encouraged to start Miralax daily by GI encouraged to add Benefiber daily

## 2022-07-05 ENCOUNTER — Encounter: Payer: Self-pay | Admitting: Physical Therapy

## 2022-07-05 ENCOUNTER — Ambulatory Visit: Payer: Medicare Other | Attending: Surgery | Admitting: Physical Therapy

## 2022-07-05 ENCOUNTER — Ambulatory Visit (HOSPITAL_BASED_OUTPATIENT_CLINIC_OR_DEPARTMENT_OTHER)
Admission: RE | Admit: 2022-07-05 | Discharge: 2022-07-05 | Disposition: A | Discharge status: Home / Self Care | Payer: Medicare Other | Source: Ambulatory Visit | Attending: Family Medicine | Admitting: Family Medicine

## 2022-07-05 DIAGNOSIS — R1011 Right upper quadrant pain: Secondary | ICD-10-CM

## 2022-07-05 DIAGNOSIS — R2241 Localized swelling, mass and lump, right lower limb: Secondary | ICD-10-CM | POA: Insufficient documentation

## 2022-07-05 DIAGNOSIS — R251 Tremor, unspecified: Secondary | ICD-10-CM | POA: Insufficient documentation

## 2022-07-05 DIAGNOSIS — D72829 Elevated white blood cell count, unspecified: Secondary | ICD-10-CM | POA: Insufficient documentation

## 2022-07-05 DIAGNOSIS — R1084 Generalized abdominal pain: Secondary | ICD-10-CM | POA: Insufficient documentation

## 2022-07-05 DIAGNOSIS — M6281 Muscle weakness (generalized): Secondary | ICD-10-CM | POA: Insufficient documentation

## 2022-07-05 DIAGNOSIS — R278 Other lack of coordination: Secondary | ICD-10-CM | POA: Insufficient documentation

## 2022-07-05 HISTORY — DX: Elevated white blood cell count, unspecified: D72.829

## 2022-07-05 HISTORY — DX: Tremor, unspecified: R25.1

## 2022-07-05 HISTORY — DX: Right upper quadrant pain: R10.11

## 2022-07-05 LAB — CBC WITH DIFFERENTIAL/PLATELET
Basophils Absolute: 0.1 10*3/uL (ref 0.0–0.1)
Basophils Relative: 0.9 % (ref 0.0–3.0)
Eosinophils Absolute: 0.4 10*3/uL (ref 0.0–0.7)
Eosinophils Relative: 4.3 % (ref 0.0–5.0)
HCT: 36.5 % (ref 36.0–46.0)
Hemoglobin: 11.6 g/dL — ABNORMAL LOW (ref 12.0–15.0)
Lymphocytes Relative: 17.5 % (ref 12.0–46.0)
Lymphs Abs: 1.7 10*3/uL (ref 0.7–4.0)
MCHC: 31.6 g/dL (ref 30.0–36.0)
MCV: 89.2 fl (ref 78.0–100.0)
Monocytes Absolute: 0.8 10*3/uL (ref 0.1–1.0)
Monocytes Relative: 8.5 % (ref 3.0–12.0)
Neutro Abs: 6.8 10*3/uL (ref 1.4–7.7)
Neutrophils Relative %: 68.8 % (ref 43.0–77.0)
Platelets: 363 10*3/uL (ref 150.0–400.0)
RBC: 4.1 Mil/uL (ref 3.87–5.11)
RDW: 16.5 % — ABNORMAL HIGH (ref 11.5–15.5)
WBC: 9.9 10*3/uL (ref 4.0–10.5)

## 2022-07-05 MED ORDER — GADOBUTROL 1 MMOL/ML IV SOLN
6.0000 mL | Freq: Once | INTRAVENOUS | Status: AC | PRN
Start: 2022-07-05 — End: 2022-07-05
  Administered 2022-07-05: 6 mL via INTRAVENOUS

## 2022-07-05 NOTE — Assessment & Plan Note (Deleted)
Repatha

## 2022-07-05 NOTE — Assessment & Plan Note (Signed)
With elevated Alk phos and wbc, both slight but will proceed with abdominal ultrasound to further investigate

## 2022-07-05 NOTE — Assessment & Plan Note (Signed)
Supplement and monitor 

## 2022-07-05 NOTE — Assessment & Plan Note (Signed)
Prescribed Doxycycline.

## 2022-07-05 NOTE — Assessment & Plan Note (Signed)
[id number] acceptable, minimize simple carbs. Increase exercise as tolerated.  

## 2022-07-05 NOTE — Assessment & Plan Note (Signed)
With unsteady gait and recurrent falls. Referred to local neurologist at patient request. She has previously been driving up to [facility] but she no longer wants to make the drive.

## 2022-07-05 NOTE — Therapy (Signed)
OUTPATIENT PHYSICAL THERAPY TREATMENT NOTE   Patient Name: [name] MRN: [id number] DOB:[birth year], 76 y.o., female Today's Date: 07/05/2022  PCP: Mosie Lukes, MD REFERRING PROVIDER: Ileana Roup, MD  END OF SESSION:   PT End of Session - 07/05/22 0933     Visit Number 2    Date for PT Re-Evaluation 09/04/22    Authorization Type [employer] medicare    Authorization - Visit Number 2    Authorization - Number of Visits 10    PT Start Time 0930    PT Stop Time 1010    PT Time Calculation (min) 40 min    Activity Tolerance Patient tolerated treatment well    Behavior During Therapy WFL for tasks assessed/performed             Past Medical History:  Diagnosis Date   Allergy    Anemia    Anxiety    past hx    Arthritis    Asthma    Atrial flutter ([place])    past history- not current   Breast cancer ([place])    CAD (coronary artery disease) CARDIOLOGIST--  DR Angelena Form   mild non-obstructive cad   Cancer ([place])    right   Cataract    bilaterally removed    Chronic constipation    Chronic kidney disease    stage 3 ckd, interstitial cystitis   Concussion    x 3   COPD (chronic obstructive pulmonary disease) ([place])    Depression    past hx    Family history of malignant hyperthermia    father had this   Fibromyalgia    Frequency of urination    GERD (gastroesophageal reflux disease)    H/O hiatal hernia    History of basal cell carcinoma excision    X2   History of breast cancer ONCOLOGIST-- DR Jana Hakim---  NO RECURRANCE   DX 07/2012;  LOW GRADE DCIS  ER+PR+  ----  S/P RIGHT LUMPECTOMY WITH NEGATIVE MARGINS/   RADIATION ENDED 11/2012   History of chronic bronchitis    History of colonic polyps    History of tachycardia    CONTROLLED  WITH ATENOLOL   Hyperlipidemia    Hypertension    Neuromuscular disorder (HCC)    fibromyalgia   Neuropathy    Osteoporosis 01/2019   T score -2.2 stable/improved from prior study   Pelvic pain    Personal history of  radiation therapy    S/P radiation therapy 11/12/12 - 12/05/12   right Breast   Sepsis ([place]) 2014   from UTI    Sinus headache    Sjogren's disease ([place])    Urgency of urination    Past Surgical History:  Procedure Laterality Date   29 HOUR [place] STUDY N/A 04/03/2016   Procedure: 24 HOUR [place] STUDY;  Surgeon: Manus Gunning, MD;  Location: [facility];  Service: Gastroenterology;  Laterality: N/A;   BREAST BIOPSY Right 08/23/2012   ADH   BREAST BIOPSY Right 10/11/2012   Ductal Carcinoma   BREAST EXCISIONAL BIOPSY Right 09/04/2013   benign   BREAST LUMPECTOMY Right 10-11-2012   W/ SLN BX   CARDIAC CATHETERIZATION  09-13-2007  DR Lia Foyer   WELL-PRESERVED LVF/  DIFFUSE SCATTERED CORONARY CALCIFACATION AND ATHEROSCLEROSIS WITHOUT OBSTRUCTION   CARDIAC CATHETERIZATION  08-04-2010  DR MCALHANY   NON-OBSTRUCTIVE CAD/  pLAD 40%/  oLAD 30%/  mLAD 30%/  pRCA 30%/  EF 60%   CARDIOVASCULAR STRESS TEST  06-18-2012  DR  McALHANY   LOW RISK NUCLEAR STUDY/  SMALL FIXED AREA OF MODERATELY DECREASED UPTAKE IN ANTEROSEPTAL WALL WHICH MAY BE ARTIFACTUAL/  NO ISCHEMIA/  EF 68%   COLONOSCOPY  09-29-2010   CYSTOSCOPY     CYSTOSCOPY WITH HYDRODISTENSION AND BIOPSY N/A 03/06/2014   Procedure: CYSTOSCOPY/HYDRODISTENSION/ INSTILATION OF MARCAINE AND PYRIDIUM;  Surgeon: Ailene Rud, MD;  Location: [facility];  Service: Urology;  Laterality: N/A;   ESOPHAGEAL MANOMETRY N/A 04/03/2016   Procedure: ESOPHAGEAL MANOMETRY (EM);  Surgeon: Manus Gunning, MD;  Location: [facility];  Service: Gastroenterology;  Laterality: N/A;   EXTRACORPOREAL SHOCK WAVE LITHOTRIPSY Left 02/06/2019   Procedure: EXTRACORPOREAL SHOCK WAVE LITHOTRIPSY (ESWL);  Surgeon: Kathie Rhodes, MD;  Location: [facility];  Service: Urology;  Laterality: Left;   NASAL SINUS SURGERY  1985   ORIF RIGHT ANKLE  FX  2006   POLYPECTOMY     REMOVAL VOCAL CORD CYST  FEB 2014   RIGHT BREAST BX  08-23-2012   RIGHT HAND  SURGERY  X3  LAST ONE 2009   INCLUDES  ORIF RIGHT 5TH FINGER AND REVISION TWICE   SKIN CANCER EXCISION     TONSILLECTOMY AND ADENOIDECTOMY  AGE 53   TOTAL ABDOMINAL HYSTERECTOMY W/ BILATERAL SALPINGOOPHORECTOMY  1982   W/  APPENDECTOMY   TRANSTHORACIC ECHOCARDIOGRAM  06-24-2012   GRADE I DIASTOLIC DYSFUNCTION/  EF 55-60%/  MILD MR   UPPER GASTROINTESTINAL ENDOSCOPY     Patient Active Problem List   Diagnosis Date Noted   Chronic renal insufficiency 03/21/2022   Sjogren's disease ([place]) 12/12/2021   Vocal cord cyst 05/18/2021   High serum vitamin D 05/18/2021   Dysuria 03/09/2021   Vitamin B12 deficiency 03/08/2021   Preventative health care 03/08/2021   Hyperglycemia 03/08/2021   Low back pain 09/08/2020   OSA and COPD overlap syndrome ([place]) 06/10/2020   Serotonin withdrawal syndrome without complication 78/93/8101   Recurrent falls 05/02/2020   Statin intolerance 02/29/2020   RLS (restless legs syndrome) 11/09/2019   Kidney stone 02/26/2019   Recurrent sinusitis 02/25/2019   Hx of colonic polyp 02/25/2019   DDD (degenerative disc disease), cervical 09/17/2018   Degenerative scoliosis 04/22/2018   Primary insomnia 06/25/2017   Chronic interstitial cystitis 02/01/2017   Chronic cough 01/09/2017   Right arm pain 07/20/2016   Deviated septum 05/12/2016   Nasal turbinate hypertrophy 05/12/2016   Dysphagia    Allergic rhinitis 01/25/2016   Other and combined forms of senile cataract 07/15/2015   Dyspnea    Left hip pain 04/30/2015   Sciatica 04/30/2015   Knee pain, right 04/30/2015   Bilateral carpal tunnel syndrome 03/04/2015   Hyperlipidemia 03/01/2015   Pulmonary nodules 02/12/2015   External hemorrhoids 02/05/2015   Chronic night sweats 01/08/2015   Atypical chest pain 01/01/2015   Renal insufficiency 07/16/2014   Memory loss 05/22/2014   Peripheral neuropathy 05/22/2014   Abdominal pain 10/26/2013   Headache 10/13/2013   Arthralgia 08/18/2013   ANA positive  07/29/2013   Depression 02/05/2013   Family history of malignant hyperthermia 02/05/2013   Malignant neoplasm of lower-outer quadrant of right breast of female, estrogen receptor positive ([place]) 01/03/2013   Splenic lesion 09/02/2012   Asthma, allergic 08/05/2012   GERD (gastroesophageal reflux disease) 06/03/2012   Edema 04/08/2012   Stricture and stenosis of esophagus 10/19/2011   Functional constipation 07/21/2011   Nonspecific (abnormal) findings on radiological and other examination of biliary tract 07/21/2011   Osteoporosis 04/17/2011   Leg pain 02/24/2011  FIBROCYSTIC BREAST DISEASE, HX OF 10/18/2010   Essential hypertension 08/25/2010   CAD in native artery 08/25/2010   TOBACCO ABUSE, HX OF 08/25/2010   GENERALIZED ANXIETY DISORDER 08/03/2010   Fibromyalgia 01/11/2010   REFERRING DIAG: R15.9 (ICD-10-CM) - Full incontinence of feces   THERAPY DIAG:  Muscle weakness (generalized)   Other lack of coordination   Generalized abdominal pain   Rationale for Evaluation and Treatment Rehabilitation   ONSET DATE: 2019   SUBJECTIVE:                                                                                                                                                                                            SUBJECTIVE STATEMENT: I am doing well today. I had a MRI of my back and do not have the results. I am having an [place] of gall bladder and liver. I have not had any fecal leakage. I had a stool and was able to stop it. Then I relaxed and the stool came out.  The rectum is feeling tighter. No pads for several days now. I have been able to get to the bathroom at home without leakage. The vaginal moisturizers are helping.   PAIN:  Are you having pain? Yes NPRS scale: 5/10 Pain location:  abdomen   Pain type: stabbing, hot Pain description: constant    Aggravating factors: when stomach is bloated, touching the abdomen Relieving factors: when completely empty from  stool,  PAIN:  Are you having pain? Yes: NPRS scale: 6/10 Pain location: low back Pain description: intermittent Aggravating factors: movement ,standing up, supine to sitting Relieving factors: laying down     PRECAUTIONS: Other: Breast cancer with radiation   WEIGHT BEARING RESTRICTIONS No   FALLS:  Has patient fallen in last 6 months? No, cane is helping   LIVING ENVIRONMENT: Lives with: lives with their family   OCCUPATION: retired   PLOF: Independent   PATIENT GOALS reduce leakage and abdominal pain, increase strength of anal sphincter   PERTINENT HISTORY:  Breast cancer 07/2012 with radiation 11/12/2012-12.12.2013, CAD (coronary artery disease); Chronic kidney disease stage 3; Fibromyalgia; Sjogren's disease ; Osteoporosis; TOTAL ABDOMINAL HYSTERECTOMY W/ BILATERAL SALPINGOOPHORECTOMY 1982     BOWEL MOVEMENT Pain with bowel movement: Yes, due to hemorrhoids that bleed and are painful Type of bowel movement:Type (Bristol Stool Scale) Type 1, 4, 5, 7, Frequency 1-5 days, and Strain Yes by holding abdomen and leaning forward.  Fully empty rectum: No Leakage: Yes: 1 accident per day , gets fecal urgency Pads: None Fiber supplement: Yes: miralax   URINATION Pain with urination: Yes, due to the UTI Fully empty  bladder: No, push pushes the suprapubic area and get 1/4 of a teacup Stream: Strong and Weak Urgency: yes when she is out  Frequency: urinate every 2-3 hours Leakage: Urge to void, Walking to the bathroom, Coughing, Sneezing, Laughing, Lifting, and Bending forward Pads: Yes: 1 depends per day   INTERCOURSE Pain with intercourse: Initial Penetration, uses moisturizers around the vaginal canal Ability to have vaginal penetration:  No Climax: has not had a climax for a long time Marinoff Scale: 3/3   PREGNANCY Vaginal deliveries 3 Tearing episiotomy 3 times   PROLAPSE None       OBJECTIVE:      PATIENT SURVEYS:  PFIQ-7 171, UIQ-7 57, CRAIQ-7 52,  POPIQ-7 62   COGNITION:            Overall cognitive status: Within functional limits for tasks assessed                              GAIT: Distance walked: walks with a cane slowly Assistive device utilized: Single point cane Level of assistance: Complete Independence                POSTURE: decreased lumbar lordosis, scoliosis, left ilium higher than right               PELVIC ALIGNMENT:   LUMBARAROM/PROM   A/PROM A/PROM  eval  Flexion Decreased by 50%  Extension Decreased by 50%  Right lateral flexion Decreased by 50%  Left lateral flexion Decreased by 50%  Right rotation Decreased by 50%  Left rotation Decreased by 50%   (Blank rows = not tested)   LOWER EXTREMITY ROM: bilateral hip ROM is full.      LOWER EXTREMITY MMT:   MMT Right eval Left eval  Hip flexion 4/5 4/5  Hip extension 4/5 4/5  Hip abduction 4/5 4/5     PALPATION:   General  tenderness throughout the abdomen                 External Perineal Exam labia minora is fused to the labia majora,                              Internal Pelvic Floor redness located on the bottom half of the urethra, tenderness located on levator ani and obturator internist, along the right side of the introitus.    Patient confirms identification and approves PT to assess internal pelvic floor and treatment Yes   PELVIC MMT:   MMT eval  Vaginal 3/5  Internal Anal Sphincter 2/5  External Anal Sphincter 2/5  Puborectalis 2/5  (Blank rows = not tested) No emotional/communication barriers or cognitive limitation. Patient is motivated to learn. Patient understands and agrees with treatment goals and plan. PT explains patient will be examined in standing, sitting, and lying down to see how their muscles and joints work. When they are ready, they will be asked to remove their underwear so PT can examine their perineum. The patient is also given the option of providing their own chaperone as one is not provided in our facility.  The patient also has the right and is explained the right to defer or refuse any part of the evaluation or treatment including the internal exam. With the patient's consent, PT will use one gloved finger to gently assess the muscles of the pelvic floor, seeing how well it contracts and relaxes  and if there is muscle symmetry. After, the patient will get dressed and PT and patient will discuss exam findings and plan of care. PT and patient discuss plan of care, schedule, attendance policy and HEP activities.         TONE: Increased tone vaginally   PROLAPSE: none   TODAY'S TREATMENT  07/05/2022 Manual: Soft tissue mobilization:circular massage to the abdomen to improve peristalic motion of the intestines and improve swelling of the abdomen, manual work to the diaphragm Scar tissue mobilization:to the hysterectomy scar that is suprapubically Myofascial release: using the suction cup to the abdomen to release the fascia, release along the mesenteric root, lifting up the intestines off the bladder, tissue rolling of the abdomen, release along the bladder    EVAL Date:  HEP established-see below  Education on vaginal moisturizers Pelvic floor contraction       PATIENT EDUCATION:  06/12/2022 Education details: education on vaginal moisturizers, gave 2 samples of moisturizers, Access Code: [email or web address] Person educated: Patient Education method: Explanation, Demonstration, Tactile cues, Verbal cues, and Handouts Education comprehension: verbalized understanding, returned demonstration, verbal cues required, tactile cues required, and needs further education     HOME EXERCISE PROGRAM: 06/12/2022 Access Code: [email or web address] URL: [email or web address] Date: 06/12/2022 Prepared by: Earlie Counts   Exercises - Supine Pelvic Floor Contraction  - 3 x daily - 7 x weekly - 1 sets - 10 reps - 5 sec hold   ASSESSMENT:   CLINICAL IMPRESSION: Patient is a 76 y.o. female who was seen today for  physical therapy  treatment for fecal incontinence.  Patient has not had fecal leakage since the last visit. She has some rectal bleeding. Patient is able to have a full bowel movement. Patient is going regularly.  The vaginal moisturizers are helping. Patient had less pain in the abdomen after the manual work. She was able to take a deeper breath and stand taller by the end of the treatment. Patient will benefit from skilled therapy to improve pelvic floor coordination, reduce pain and improve leakage.      OBJECTIVE IMPAIRMENTS decreased activity tolerance, decreased coordination, decreased endurance, decreased strength, increased fascial restrictions, increased muscle spasms, impaired tone, and pain.    ACTIVITY LIMITATIONS lifting, bending, continence, and toileting   PARTICIPATION LIMITATIONS: cleaning, interpersonal relationship, driving, and community activity   PERSONAL FACTORS Breast cancer 07/2012 with radiation 11/12/2012-12.12.2013, CAD (coronary artery disease); Chronic kidney disease stage 3; Fibromyalgia; Sjogren's disease ; Osteoporosis; TOTAL ABDOMINAL HYSTERECTOMY W/ BILATERAL SALPINGOOPHORECTOMY 1982 are also affecting patient's functional outcome.    REHAB POTENTIAL: Excellent   CLINICAL DECISION MAKING: Evolving/moderate complexity   EVALUATION COMPLEXITY: Moderate     GOALS: Goals reviewed with patient? Yes   SHORT TERM GOALS: Target date: 07/10/2022   Patient educated on vaginal moisturizers and lubricant to improve vaginal health.  Baseline: Goal status: met 07/05/2022   2.  Patient independent with initial HEP for pelvic floor relaxation, contraction and abdominal work.  Baseline:  Goal status: INITIAL   3.  Patient reports her abdominal pain si </= 25%.  Baseline:  Goal status: INITIAL     LONG TERM GOALS: Target date: 09/04/2022    Patient independent with advanced HEP for pelvic floor, core, and hip strength.  Baseline:  Goal status: INITIAL   2.   Patient reports her urinary leakage has improved >/= 75% due to improved strength and reduction of pain.  Baseline:  Goal status: INITIAL   3.  Patient reports her fecal leakage has improved >/=  75% due to increased in rectal strength.  Baseline:  Goal status: INITIAL   4.  Patient reports her abdominal pain has improved >/= 50%.  Baseline:  Goal status: INITIAL   5.  Patient is able to have penile penetration with 50% less pain due to improve vaginal moisture and reduction of trigger points in the pelvic floor.  Baseline:  Goal status: INITIAL     PLAN: PT FREQUENCY: 1x/week   PT DURATION: 12 weeks   PLANNED INTERVENTIONS: Therapeutic exercises, Therapeutic activity, Neuromuscular re-education, Patient/Family education, Joint mobilization, Electrical stimulation, Cryotherapy, Moist heat, Biofeedback, and Manual therapy   PLAN FOR NEXT SESSION:  manual work to abdomen and education on abdominal massage, diaphragmatic breathing, scar massage, check on the fecal leakage, see how the ultrasound went        Earlie Counts, PT 07/05/22 10:14 AM

## 2022-07-05 NOTE — Assessment & Plan Note (Signed)
Hydrate and monitor 

## 2022-07-05 NOTE — Assessment & Plan Note (Signed)
Well controlled, no changes to meds. Encouraged heart healthy diet such as the DASH diet and exercise as tolerated.  °

## 2022-07-10 DIAGNOSIS — M5136 Other intervertebral disc degeneration, lumbar region: Secondary | ICD-10-CM | POA: Diagnosis not present

## 2022-07-10 DIAGNOSIS — M5416 Radiculopathy, lumbar region: Secondary | ICD-10-CM | POA: Diagnosis not present

## 2022-07-11 ENCOUNTER — Ambulatory Visit (HOSPITAL_BASED_OUTPATIENT_CLINIC_OR_DEPARTMENT_OTHER)
Admission: RE | Admit: 2022-07-11 | Discharge: 2022-07-11 | Disposition: A | Discharge status: Home / Self Care | Payer: Medicare Other | Source: Ambulatory Visit | Attending: Family Medicine | Admitting: Family Medicine

## 2022-07-11 ENCOUNTER — Other Ambulatory Visit: Payer: Self-pay | Admitting: Family Medicine

## 2022-07-11 DIAGNOSIS — R1011 Right upper quadrant pain: Secondary | ICD-10-CM | POA: Diagnosis not present

## 2022-07-11 DIAGNOSIS — N2889 Other specified disorders of kidney and ureter: Secondary | ICD-10-CM

## 2022-07-12 ENCOUNTER — Encounter: Payer: Self-pay | Admitting: Physical Therapy

## 2022-07-12 ENCOUNTER — Ambulatory Visit: Payer: Medicare Other | Admitting: Physical Therapy

## 2022-07-12 DIAGNOSIS — M6281 Muscle weakness (generalized): Secondary | ICD-10-CM | POA: Diagnosis not present

## 2022-07-12 DIAGNOSIS — R1084 Generalized abdominal pain: Secondary | ICD-10-CM

## 2022-07-12 DIAGNOSIS — R278 Other lack of coordination: Secondary | ICD-10-CM | POA: Diagnosis not present

## 2022-07-12 NOTE — Therapy (Signed)
OUTPATIENT PHYSICAL THERAPY TREATMENT NOTE   Patient Name: [name] MRN: [id number] DOB:1946/05/09, 76 y.o., female Today's Date: 07/12/2022  PCP: Mosie Lukes, MD REFERRING PROVIDER: Ileana Roup, MD  END OF SESSION:   PT End of Session - 07/12/22 0850     Visit Number 3    Date for PT Re-Evaluation 09/04/22    Authorization Type [employer] medicare    Authorization - Visit Number 3    Authorization - Number of Visits 10    PT Start Time 0845    PT Stop Time 0925    PT Time Calculation (min) 40 min    Activity Tolerance Patient tolerated treatment well    Behavior During Therapy WFL for tasks assessed/performed             Past Medical History:  Diagnosis Date   Allergy    Anemia    Anxiety    past hx    Arthritis    Asthma    Atrial flutter ([place])    past history- not current   Breast cancer ([place])    CAD (coronary artery disease) CARDIOLOGIST--  DR Angelena Form   mild non-obstructive cad   Cancer ([place])    right   Cataract    bilaterally removed    Chronic constipation    Chronic kidney disease    stage 3 ckd, interstitial cystitis   Concussion    x 3   COPD (chronic obstructive pulmonary disease) ([place])    Depression    past hx    Family history of malignant hyperthermia    father had this   Fibromyalgia    Frequency of urination    GERD (gastroesophageal reflux disease)    H/O hiatal hernia    History of basal cell carcinoma excision    X2   History of breast cancer ONCOLOGIST-- DR Jana Hakim---  NO RECURRANCE   DX 07/2012;  LOW GRADE DCIS  ER+PR+  ----  S/P RIGHT LUMPECTOMY WITH NEGATIVE MARGINS/   RADIATION ENDED 11/2012   History of chronic bronchitis    History of colonic polyps    History of tachycardia    CONTROLLED  WITH ATENOLOL   Hyperlipidemia    Hypertension    Neuromuscular disorder ([place])    fibromyalgia   Neuropathy    Osteoporosis 01/2019   T score -2.2 stable/improved from prior study   Pelvic pain    Personal history of  radiation therapy    S/P radiation therapy 11/12/12 - 12/05/12   right Breast   Sepsis ([place]) 2014   from UTI    Sinus headache    Sjogren's disease ([place])    Urgency of urination    Past Surgical History:  Procedure Laterality Date   99 HOUR [place] STUDY N/A 04/03/2016   Procedure: 24 HOUR PH STUDY;  Surgeon: Manus Gunning, MD;  Location: [facility];  Service: Gastroenterology;  Laterality: N/A;   BREAST BIOPSY Right 08/23/2012   ADH   BREAST BIOPSY Right 10/11/2012   Ductal Carcinoma   BREAST EXCISIONAL BIOPSY Right 09/04/2013   benign   BREAST LUMPECTOMY Right 10-11-2012   W/ SLN BX   CARDIAC CATHETERIZATION  09-13-2007  DR Lia Foyer   WELL-PRESERVED LVF/  DIFFUSE SCATTERED CORONARY CALCIFACATION AND ATHEROSCLEROSIS WITHOUT OBSTRUCTION   CARDIAC CATHETERIZATION  08-04-2010  DR MCALHANY   NON-OBSTRUCTIVE CAD/  pLAD 40%/  oLAD 30%/  mLAD 30%/  pRCA 30%/  EF 60%   CARDIOVASCULAR STRESS TEST  06-18-2012  DR  McALHANY   LOW RISK NUCLEAR STUDY/  SMALL FIXED AREA OF MODERATELY DECREASED UPTAKE IN ANTEROSEPTAL WALL WHICH MAY BE ARTIFACTUAL/  NO ISCHEMIA/  EF 68%   COLONOSCOPY  09-29-2010   CYSTOSCOPY     CYSTOSCOPY WITH HYDRODISTENSION AND BIOPSY N/A 03/06/2014   Procedure: CYSTOSCOPY/HYDRODISTENSION/ INSTILATION OF MARCAINE AND PYRIDIUM;  Surgeon: Ailene Rud, MD;  Location: [facility];  Service: Urology;  Laterality: N/A;   ESOPHAGEAL MANOMETRY N/A 04/03/2016   Procedure: ESOPHAGEAL MANOMETRY (EM);  Surgeon: Manus Gunning, MD;  Location: [facility];  Service: Gastroenterology;  Laterality: N/A;   EXTRACORPOREAL SHOCK WAVE LITHOTRIPSY Left 02/06/2019   Procedure: EXTRACORPOREAL SHOCK WAVE LITHOTRIPSY (ESWL);  Surgeon: Kathie Rhodes, MD;  Location: [facility];  Service: Urology;  Laterality: Left;   NASAL SINUS SURGERY  1985   ORIF RIGHT ANKLE  FX  2006   POLYPECTOMY     REMOVAL VOCAL CORD CYST  FEB 2014   RIGHT BREAST BX  08-23-2012   RIGHT HAND  SURGERY  X3  LAST ONE 2009   INCLUDES  ORIF RIGHT 5TH FINGER AND REVISION TWICE   SKIN CANCER EXCISION     TONSILLECTOMY AND ADENOIDECTOMY  AGE 32   TOTAL ABDOMINAL HYSTERECTOMY W/ BILATERAL SALPINGOOPHORECTOMY  1982   W/  APPENDECTOMY   TRANSTHORACIC ECHOCARDIOGRAM  06-24-2012   GRADE I DIASTOLIC DYSFUNCTION/  EF 55-60%/  MILD MR   UPPER GASTROINTESTINAL ENDOSCOPY     Patient Active Problem List   Diagnosis Date Noted   Tremor 07/05/2022   RUQ pain 07/05/2022   Leukocytosis 07/05/2022   Chronic renal insufficiency 03/21/2022   Sjogren's disease ([place]) 12/12/2021   Vocal cord cyst 05/18/2021   High serum vitamin D 05/18/2021   Dysuria 03/09/2021   Vitamin B12 deficiency 03/08/2021   Preventative health care 03/08/2021   Hyperglycemia 03/08/2021   Low back pain 09/08/2020   OSA and COPD overlap syndrome ([place]) 06/10/2020   Serotonin withdrawal syndrome without complication 33/54/5625   Recurrent falls 05/02/2020   Statin intolerance 02/29/2020   RLS (restless legs syndrome) 11/09/2019   Kidney stone 02/26/2019   Recurrent sinusitis 02/25/2019   Hx of colonic polyp 02/25/2019   DDD (degenerative disc disease), cervical 09/17/2018   Degenerative scoliosis 04/22/2018   Primary insomnia 06/25/2017   Chronic interstitial cystitis 02/01/2017   Chronic cough 01/09/2017   Right arm pain 07/20/2016   Deviated septum 05/12/2016   Nasal turbinate hypertrophy 05/12/2016   Dysphagia    Allergic rhinitis 01/25/2016   Other and combined forms of senile cataract 07/15/2015   Dyspnea    Left hip pain 04/30/2015   Sciatica 04/30/2015   Knee pain, right 04/30/2015   Bilateral carpal tunnel syndrome 03/04/2015   Hyperlipidemia 03/01/2015   Pulmonary nodules 02/12/2015   External hemorrhoids 02/05/2015   Chronic night sweats 01/08/2015   Atypical chest pain 01/01/2015   Renal insufficiency 07/16/2014   Memory loss 05/22/2014   Peripheral neuropathy 05/22/2014   Abdominal pain  10/26/2013   Headache 10/13/2013   Arthralgia 08/18/2013   ANA positive 07/29/2013   Depression 02/05/2013   Family history of malignant hyperthermia 02/05/2013   Malignant neoplasm of lower-outer quadrant of right breast of female, estrogen receptor positive ([place]) 01/03/2013   Splenic lesion 09/02/2012   Asthma, allergic 08/05/2012   GERD (gastroesophageal reflux disease) 06/03/2012   Edema 04/08/2012   Stricture and stenosis of esophagus 10/19/2011   Functional constipation 07/21/2011   Nonspecific (abnormal) findings on radiological and other examination of  biliary tract 07/21/2011   Osteoporosis 04/17/2011   Leg pain 02/24/2011   FIBROCYSTIC BREAST DISEASE, HX OF 10/18/2010   Essential hypertension 08/25/2010   CAD in native artery 08/25/2010   TOBACCO ABUSE, HX OF 08/25/2010   GENERALIZED ANXIETY DISORDER 08/03/2010   Fibromyalgia 01/11/2010   REFERRING DIAG: R15.9 (ICD-10-CM) - Full incontinence of feces   THERAPY DIAG:  Muscle weakness (generalized)   Other lack of coordination   Generalized abdominal pain   Rationale for Evaluation and Treatment Rehabilitation   ONSET DATE: 2019   SUBJECTIVE:                                                                                                                                                                                            SUBJECTIVE STATEMENT: I have not had fecal leakage since last visit. I lost 1.5 inches of my waist since last visit. The massage reduced my pain. 9/3 I will be getting an injection into my back. No urinary leakage since last visit.  PAIN:  Are you having pain? Yes NPRS scale: 3/10 Pain location:  abdomen   Pain type: stabbing, hot Pain description: constant    Aggravating factors: when stomach is bloated, touching the abdomen Relieving factors: when completely empty from stool,  PAIN:  Are you having pain? Yes: NPRS scale: 9/10 Pain location: low back Pain description:  intermittent Aggravating factors: movement ,standing up, supine to sitting Relieving factors: laying down     PRECAUTIONS: Other: Breast cancer with radiation   WEIGHT BEARING RESTRICTIONS No   FALLS:  Has patient fallen in last 6 months? No, cane is helping   LIVING ENVIRONMENT: Lives with: lives with their family   OCCUPATION: retired   PLOF: Independent   PATIENT GOALS reduce leakage and abdominal pain, increase strength of anal sphincter   PERTINENT HISTORY:  Breast cancer 07/2012 with radiation 11/12/2012-12.12.2013, CAD (coronary artery disease); Chronic kidney disease stage 3; Fibromyalgia; Sjogren's disease ; Osteoporosis; TOTAL ABDOMINAL HYSTERECTOMY W/ BILATERAL SALPINGOOPHORECTOMY 1982     BOWEL MOVEMENT Pain with bowel movement: Yes, due to hemorrhoids that bleed and are painful Type of bowel movement:Type (Bristol Stool Scale) Type 1, 4, 5, 7, Frequency 1-5 days, and Strain Yes by holding abdomen and leaning forward.  Fully empty rectum: No Leakage: Yes: 1 accident per day , gets fecal urgency Pads: None Fiber supplement: Yes: miralax   URINATION Pain with urination: Yes, due to the UTI Fully empty bladder: No, push pushes the suprapubic area and get 1/4 of a teacup Stream: Strong and Weak Urgency: yes when she is out  Frequency: urinate every 2-3 hours Leakage:  Urge to void, Walking to the bathroom, Coughing, Sneezing, Laughing, Lifting, and Bending forward Pads: Yes: 1 depends per day   INTERCOURSE Pain with intercourse: Initial Penetration, uses moisturizers around the vaginal canal Ability to have vaginal penetration:  No Climax: has not had a climax for a long time Marinoff Scale: 3/3   PREGNANCY Vaginal deliveries 3 Tearing episiotomy 3 times   PROLAPSE None       OBJECTIVE:      PATIENT SURVEYS:  PFIQ-7 171, UIQ-7 57, CRAIQ-7 52, POPIQ-7 62   COGNITION:            Overall cognitive status: Within functional limits for tasks assessed                               GAIT: Distance walked: walks with a cane slowly Assistive device utilized: Single point cane Level of assistance: Complete Independence                POSTURE: decreased lumbar lordosis, scoliosis, left ilium higher than right               PELVIC ALIGNMENT:   LUMBARAROM/PROM   A/PROM A/PROM  eval  Flexion Decreased by 50%  Extension Decreased by 50%  Right lateral flexion Decreased by 50%  Left lateral flexion Decreased by 50%  Right rotation Decreased by 50%  Left rotation Decreased by 50%   (Blank rows = not tested)   LOWER EXTREMITY ROM: bilateral hip ROM is full.      LOWER EXTREMITY MMT:   MMT Right eval Left eval  Hip flexion 4/5 4/5  Hip extension 4/5 4/5  Hip abduction 4/5 4/5     PALPATION:   General  tenderness throughout the abdomen                 External Perineal Exam labia minora is fused to the labia majora,                              Internal Pelvic Floor redness located on the bottom half of the urethra, tenderness located on levator ani and obturator internist, along the right side of the introitus.    Patient confirms identification and approves PT to assess internal pelvic floor and treatment Yes   PELVIC MMT:   MMT eval  Vaginal 3/5  Internal Anal Sphincter 2/5  External Anal Sphincter 2/5  Puborectalis 2/5  (Blank rows = not tested) No emotional/communication barriers or cognitive limitation. Patient is motivated to learn. Patient understands and agrees with treatment goals and plan. PT explains patient will be examined in standing, sitting, and lying down to see how their muscles and joints work. When they are ready, they will be asked to remove their underwear so PT can examine their perineum. The patient is also given the option of providing their own chaperone as one is not provided in our facility. The patient also has the right and is explained the right to defer or refuse any part of the evaluation or  treatment including the internal exam. With the patient's consent, PT will use one gloved finger to gently assess the muscles of the pelvic floor, seeing how well it contracts and relaxes and if there is muscle symmetry. After, the patient will get dressed and PT and patient will discuss exam findings and plan of care. PT and patient discuss plan of  care, schedule, attendance policy and HEP activities.         TONE: Increased tone vaginally   PROLAPSE: none   TODAY'S TREATMENT  07/12/2022 Manual: Soft tissue mobilization:circular massage to the abdomen to improve peristalic motion of the intestines and improve swelling of the abdomen, manual work to the diaphragm Scar tissue mobilization:mobilization:to the hysterectomy scar that is suprapubically Myofascial release:release along the mesenteric root, lifting up the intestines off the bladder, tissue rolling of the abdomen, release along the bladder; Patient in sidely with therapist working on the obliques and lower abdominal area to release the fascial restrictions; release of the inguinal canal  Neuromuscular re-education: Core retraining: diaphragmatic breathing with therapist giving tactile cues to the rib cage to expand correctly, breathing in supine, sidely and standing to open up the abdomen and lower rib cage.     07/05/2022 Manual: Soft tissue mobilization:circular massage to the abdomen to improve peristalic motion of the intestines and improve swelling of the abdomen, manual work to the diaphragm Scar tissue mobilization:to the hysterectomy scar that is suprapubically Myofascial release: using the suction cup to the abdomen to release the fascia, release along the mesenteric root, lifting up the intestines off the bladder, tissue rolling of the abdomen, release along the bladder     EVAL Date:  HEP established-see below  Education on vaginal moisturizers Pelvic floor contraction       PATIENT EDUCATION:  06/12/2022 Education  details: education on vaginal moisturizers, gave 2 samples of moisturizers, Access Code: [email or web address] Person educated: Patient Education method: Explanation, Demonstration, Tactile cues, Verbal cues, and Handouts Education comprehension: verbalized understanding, returned demonstration, verbal cues required, tactile cues required, and needs further education     HOME EXERCISE PROGRAM: 06/12/2022 Access Code: [email or web address] URL: [email or web address] Date: 06/12/2022 Prepared by: Earlie Counts   Exercises - Supine Pelvic Floor Contraction  - 3 x daily - 7 x weekly - 1 sets - 10 reps - 5 sec hold   ASSESSMENT:   CLINICAL IMPRESSION: Patient is a 76 y.o. female who was seen today for physical therapy  treatment for fecal incontinence.  No urinary or fecal leakage since last visit. She lost 1.5 inches of her abdominal. Patient did have gas come out of the vaginal 1 time since last visit. Patient is using the vaginal moisturizers and making it feeling better. The vulvar are does not feel as sore and feels softer. Patient is able to open up the lower rib cage with greater ease. Patient has increased softness of the abdominal tissue. Patient will benefit from skilled therapy to improve pelvic floor coordination, reduce pain and improve leakage.      OBJECTIVE IMPAIRMENTS decreased activity tolerance, decreased coordination, decreased endurance, decreased strength, increased fascial restrictions, increased muscle spasms, impaired tone, and pain.    ACTIVITY LIMITATIONS lifting, bending, continence, and toileting   PARTICIPATION LIMITATIONS: cleaning, interpersonal relationship, driving, and community activity   PERSONAL FACTORS Breast cancer 07/2012 with radiation 11/12/2012-12.12.2013, CAD (coronary artery disease); Chronic kidney disease stage 3; Fibromyalgia; Sjogren's disease ; Osteoporosis; TOTAL ABDOMINAL HYSTERECTOMY W/ BILATERAL SALPINGOOPHORECTOMY 1982 are also affecting patient's  functional outcome.    REHAB POTENTIAL: Excellent   CLINICAL DECISION MAKING: Evolving/moderate complexity   EVALUATION COMPLEXITY: Moderate     GOALS: Goals reviewed with patient? Yes   SHORT TERM GOALS: Target date: 07/10/2022   Patient educated on vaginal moisturizers and lubricant to improve vaginal health.  Baseline: Goal status: met 07/05/2022   2.  Patient independent with initial HEP for pelvic  floor relaxation, contraction and abdominal work.  Baseline:  Goal status: INITIAL   3.  Patient reports her abdominal pain si </= 25%.  Baseline:  Goal status: Met 07/12/2022    LONG TERM GOALS: Target date: 09/04/2022    Patient independent with advanced HEP for pelvic floor, core, and hip strength.  Baseline:  Goal status: INITIAL   2.  Patient reports her urinary leakage has improved >/= 75% due to improved strength and reduction of pain.  Baseline:  Goal status: INITIAL   3.  Patient reports her fecal leakage has improved >/= 75% due to increased in rectal strength.  Baseline:  Goal status: INITIAL   4.  Patient reports her abdominal pain has improved >/= 50%.  Baseline:  Goal status: INITIAL   5.  Patient is able to have penile penetration with 50% less pain due to improve vaginal moisture and reduction of trigger points in the pelvic floor.  Baseline:  Goal status: INITIAL     PLAN: PT FREQUENCY: 1x/week   PT DURATION: 12 weeks   PLANNED INTERVENTIONS: Therapeutic exercises, Therapeutic activity, Neuromuscular re-education, Patient/Family education, Joint mobilization, Electrical stimulation, Cryotherapy, Moist heat, Biofeedback, and Manual therapy   PLAN FOR NEXT SESSION:  manual work to abdomen and education on abdominal massage, work on abdominal contraction    Earlie Counts, PT 07/12/22 9:31 AM

## 2022-07-19 ENCOUNTER — Ambulatory Visit (HOSPITAL_BASED_OUTPATIENT_CLINIC_OR_DEPARTMENT_OTHER)
Admission: RE | Admit: 2022-07-19 | Discharge: 2022-07-19 | Disposition: A | Discharge status: Home / Self Care | Payer: Medicare Other | Source: Ambulatory Visit | Attending: Family Medicine | Admitting: Family Medicine

## 2022-07-19 DIAGNOSIS — N2889 Other specified disorders of kidney and ureter: Secondary | ICD-10-CM | POA: Diagnosis not present

## 2022-07-19 DIAGNOSIS — K449 Diaphragmatic hernia without obstruction or gangrene: Secondary | ICD-10-CM | POA: Diagnosis not present

## 2022-07-19 DIAGNOSIS — R1030 Lower abdominal pain, unspecified: Secondary | ICD-10-CM | POA: Diagnosis not present

## 2022-07-19 DIAGNOSIS — Z853 Personal history of malignant neoplasm of breast: Secondary | ICD-10-CM | POA: Diagnosis not present

## 2022-07-19 MED ORDER — GADOBUTROL 1 MMOL/ML IV SOLN
6.0000 mL | Freq: Once | INTRAVENOUS | Status: AC | PRN
Start: 2022-07-19 — End: 2022-07-19
  Administered 2022-07-19: 6 mL via INTRAVENOUS

## 2022-07-27 DIAGNOSIS — M5416 Radiculopathy, lumbar region: Secondary | ICD-10-CM | POA: Diagnosis not present

## 2022-07-28 ENCOUNTER — Encounter: Payer: Self-pay | Admitting: Physical Therapy

## 2022-07-28 ENCOUNTER — Ambulatory Visit: Payer: Medicare Other | Attending: Surgery | Admitting: Physical Therapy

## 2022-07-28 DIAGNOSIS — R1084 Generalized abdominal pain: Secondary | ICD-10-CM | POA: Diagnosis not present

## 2022-07-28 DIAGNOSIS — R278 Other lack of coordination: Secondary | ICD-10-CM | POA: Diagnosis not present

## 2022-07-28 DIAGNOSIS — M6281 Muscle weakness (generalized): Secondary | ICD-10-CM | POA: Diagnosis not present

## 2022-07-28 NOTE — Therapy (Signed)
OUTPATIENT PHYSICAL THERAPY TREATMENT NOTE   Patient Name: [name] MRN: [id number] DOB:[birth year], 76 y.o., female Today's Date: 07/28/2022  PCP: Mosie Lukes, MD REFERRING PROVIDER: Ileana Roup, MD  END OF SESSION:   PT End of Session - 07/28/22 1021     Visit Number 4    Date for PT Re-Evaluation 09/04/22    Authorization Type [employer] medicare    Authorization - Visit Number 4    Authorization - Number of Visits 10    PT Start Time 5462    PT Stop Time 1055    PT Time Calculation (min) 40 min    Activity Tolerance Patient tolerated treatment well    Behavior During Therapy WFL for tasks assessed/performed             Past Medical History:  Diagnosis Date   Allergy    Anemia    Anxiety    past hx    Arthritis    Asthma    Atrial flutter ([place])    past history- not current   Breast cancer ([place])    CAD (coronary artery disease) CARDIOLOGIST--  DR Angelena Form   mild non-obstructive cad   Cancer ([place])    right   Cataract    bilaterally removed    Chronic constipation    Chronic kidney disease    stage 3 ckd, interstitial cystitis   Concussion    x 3   COPD (chronic obstructive pulmonary disease) ([place])    Depression    past hx    Family history of malignant hyperthermia    father had this   Fibromyalgia    Frequency of urination    GERD (gastroesophageal reflux disease)    H/O hiatal hernia    History of basal cell carcinoma excision    X2   History of breast cancer ONCOLOGIST-- DR Jana Hakim---  NO RECURRANCE   DX 07/2012;  LOW GRADE DCIS  ER+PR+  ----  S/P RIGHT LUMPECTOMY WITH NEGATIVE MARGINS/   RADIATION ENDED 11/2012   History of chronic bronchitis    History of colonic polyps    History of tachycardia    CONTROLLED  WITH ATENOLOL   Hyperlipidemia    Hypertension    Neuromuscular disorder ([place])    fibromyalgia   Neuropathy    Osteoporosis 01/2019   T score -2.2 stable/improved from prior study   Pelvic pain    Personal history of  radiation therapy    S/P radiation therapy 11/12/12 - 12/05/12   right Breast   Sepsis ([place]) 2014   from UTI    Sinus headache    Sjogren's disease ([place])    Urgency of urination    Past Surgical History:  Procedure Laterality Date   44 HOUR [place] STUDY N/A 04/03/2016   Procedure: 24 HOUR [place] STUDY;  Surgeon: Manus Gunning, MD;  Location: [facility];  Service: Gastroenterology;  Laterality: N/A;   BREAST BIOPSY Right 08/23/2012   ADH   BREAST BIOPSY Right 10/11/2012   Ductal Carcinoma   BREAST EXCISIONAL BIOPSY Right 09/04/2013   benign   BREAST LUMPECTOMY Right 10-11-2012   W/ [facility] BX   CARDIAC CATHETERIZATION  09-13-2007  DR Lia Foyer   WELL-PRESERVED LVF/  DIFFUSE SCATTERED CORONARY CALCIFACATION AND ATHEROSCLEROSIS WITHOUT OBSTRUCTION   CARDIAC CATHETERIZATION  08-04-2010  DR MCALHANY   NON-OBSTRUCTIVE CAD/  pLAD 40%/  oLAD 30%/  mLAD 30%/  pRCA 30%/  EF 60%   CARDIOVASCULAR STRESS TEST  06-18-2012  DR  McALHANY   LOW RISK NUCLEAR STUDY/  SMALL FIXED AREA OF MODERATELY DECREASED UPTAKE IN ANTEROSEPTAL WALL WHICH MAY BE ARTIFACTUAL/  NO ISCHEMIA/  EF 68%   COLONOSCOPY  09-29-2010   CYSTOSCOPY     CYSTOSCOPY WITH HYDRODISTENSION AND BIOPSY N/A 03/06/2014   Procedure: CYSTOSCOPY/HYDRODISTENSION/ INSTILATION OF MARCAINE AND PYRIDIUM;  Surgeon: Ailene Rud, MD;  Location: [facility];  Service: Urology;  Laterality: N/A;   ESOPHAGEAL MANOMETRY N/A 04/03/2016   Procedure: ESOPHAGEAL MANOMETRY (EM);  Surgeon: Manus Gunning, MD;  Location: [facility];  Service: Gastroenterology;  Laterality: N/A;   EXTRACORPOREAL SHOCK WAVE LITHOTRIPSY Left 02/06/2019   Procedure: EXTRACORPOREAL SHOCK WAVE LITHOTRIPSY (ESWL);  Surgeon: Kathie Rhodes, MD;  Location: [facility];  Service: Urology;  Laterality: Left;   NASAL SINUS SURGERY  1985   ORIF RIGHT ANKLE  FX  2006   POLYPECTOMY     REMOVAL VOCAL CORD CYST  FEB 2014   RIGHT BREAST BX  08-23-2012   RIGHT HAND  SURGERY  X3  LAST ONE 2009   INCLUDES  ORIF RIGHT 5TH FINGER AND REVISION TWICE   SKIN CANCER EXCISION     TONSILLECTOMY AND ADENOIDECTOMY  AGE 32   TOTAL ABDOMINAL HYSTERECTOMY W/ BILATERAL SALPINGOOPHORECTOMY  1982   W/  APPENDECTOMY   TRANSTHORACIC ECHOCARDIOGRAM  06-24-2012   GRADE I DIASTOLIC DYSFUNCTION/  EF 55-60%/  MILD MR   UPPER GASTROINTESTINAL ENDOSCOPY     Patient Active Problem List   Diagnosis Date Noted   Tremor 07/05/2022   RUQ pain 07/05/2022   Leukocytosis 07/05/2022   Chronic renal insufficiency 03/21/2022   Sjogren's disease ([place]) 12/12/2021   Vocal cord cyst 05/18/2021   High serum vitamin D 05/18/2021   Dysuria 03/09/2021   Vitamin B12 deficiency 03/08/2021   Preventative health care 03/08/2021   Hyperglycemia 03/08/2021   Low back pain 09/08/2020   OSA and COPD overlap syndrome ([place]) 06/10/2020   Serotonin withdrawal syndrome without complication 33/54/5625   Recurrent falls 05/02/2020   Statin intolerance 02/29/2020   RLS (restless legs syndrome) 11/09/2019   Kidney stone 02/26/2019   Recurrent sinusitis 02/25/2019   Hx of colonic polyp 02/25/2019   DDD (degenerative disc disease), cervical 09/17/2018   Degenerative scoliosis 04/22/2018   Primary insomnia 06/25/2017   Chronic interstitial cystitis 02/01/2017   Chronic cough 01/09/2017   Right arm pain 07/20/2016   Deviated septum 05/12/2016   Nasal turbinate hypertrophy 05/12/2016   Dysphagia    Allergic rhinitis 01/25/2016   Other and combined forms of senile cataract 07/15/2015   Dyspnea    Left hip pain 04/30/2015   Sciatica 04/30/2015   Knee pain, right 04/30/2015   Bilateral carpal tunnel syndrome 03/04/2015   Hyperlipidemia 03/01/2015   Pulmonary nodules 02/12/2015   External hemorrhoids 02/05/2015   Chronic night sweats 01/08/2015   Atypical chest pain 01/01/2015   Renal insufficiency 07/16/2014   Memory loss 05/22/2014   Peripheral neuropathy 05/22/2014   Abdominal pain  10/26/2013   Headache 10/13/2013   Arthralgia 08/18/2013   ANA positive 07/29/2013   Depression 02/05/2013   Family history of malignant hyperthermia 02/05/2013   Malignant neoplasm of lower-outer quadrant of right breast of female, estrogen receptor positive ([place]) 01/03/2013   Splenic lesion 09/02/2012   Asthma, allergic 08/05/2012   GERD (gastroesophageal reflux disease) 06/03/2012   Edema 04/08/2012   Stricture and stenosis of esophagus 10/19/2011   Functional constipation 07/21/2011   Nonspecific (abnormal) findings on radiological and other examination of  biliary tract 07/21/2011   Osteoporosis 04/17/2011   Leg pain 02/24/2011   FIBROCYSTIC BREAST DISEASE, HX OF 10/18/2010   Essential hypertension 08/25/2010   CAD in native artery 08/25/2010   TOBACCO ABUSE, HX OF 08/25/2010   GENERALIZED ANXIETY DISORDER 08/03/2010   Fibromyalgia 01/11/2010  REFERRING DIAG: R15.9 (ICD-10-CM) - Full incontinence of feces   THERAPY DIAG:  Muscle weakness (generalized)   Other lack of coordination   Generalized abdominal pain   Rationale for Evaluation and Treatment Rehabilitation   ONSET DATE: 2019   SUBJECTIVE:                                                                                                                                                                                            SUBJECTIVE STATEMENT: I had 2 injections in my back. I have not been able to do the exercises in the past 5 days due to my back pain but now it is feeling better. The diaphragmatic breathing is helping me with my shortness of breath. Last night was the first time I had to sit on the toilet for 1 hour due to little pieces of stool coming out and wiping a lot. I will have to start the treatments of my interstitial cystitis.  PAIN:  Are you having pain? Yes NPRS scale: 3/10 Pain location:  abdomen   Pain type: stabbing, hot Pain description: constant    Aggravating factors: when stomach is  bloated, touching the abdomen Relieving factors: when completely empty from stool,  PAIN:  Are you having pain? Yes: NPRS scale: 5/10 Pain location: low back Pain description: intermittent Aggravating factors: movement ,standing up, supine to sitting Relieving factors: laying down     PRECAUTIONS: Other: Breast cancer with radiation   WEIGHT BEARING RESTRICTIONS No   FALLS:  Has patient fallen in last 6 months? No, cane is helping   LIVING ENVIRONMENT: Lives with: lives with their family   OCCUPATION: retired   PLOF: Independent   PATIENT GOALS reduce leakage and abdominal pain, increase strength of anal sphincter   PERTINENT HISTORY:  Breast cancer 07/2012 with radiation 11/12/2012-12.12.2013, CAD (coronary artery disease); Chronic kidney disease stage 3; Fibromyalgia; Sjogren's disease ; Osteoporosis; TOTAL ABDOMINAL HYSTERECTOMY W/ BILATERAL SALPINGOOPHORECTOMY 1982     BOWEL MOVEMENT Pain with bowel movement: Yes, due to hemorrhoids that bleed and are painful Type of bowel movement:Type (Bristol Stool Scale) Type 1, 4, 5, 7, Frequency 1-5 days, and Strain Yes by holding abdomen and leaning forward.  Fully empty rectum: No Leakage: Yes: 1 accident per day , gets fecal urgency Pads: None Fiber supplement: Yes: miralax  URINATION Pain with urination: Yes, due to the UTI Fully empty bladder: No, push pushes the suprapubic area and get 1/4 of a teacup Stream: Strong and Weak Urgency: yes when she is out  Frequency: urinate every 2-3 hours Leakage: Urge to void, Walking to the bathroom, Coughing, Sneezing, Laughing, Lifting, and Bending forward Pads: Yes: 1 depends per day   INTERCOURSE Pain with intercourse: Initial Penetration, uses moisturizers around the vaginal canal Ability to have vaginal penetration:  No Climax: has not had a climax for a long time Marinoff Scale: 3/3   PREGNANCY Vaginal deliveries 3 Tearing episiotomy 3 times   PROLAPSE None        OBJECTIVE:      PATIENT SURVEYS:  PFIQ-7 171, UIQ-7 57, CRAIQ-7 52, POPIQ-7 62   COGNITION:            Overall cognitive status: Within functional limits for tasks assessed                              GAIT: Distance walked: walks with a cane slowly Assistive device utilized: Single point cane Level of assistance: Complete Independence                POSTURE: decreased lumbar lordosis, scoliosis, left ilium higher than right               PELVIC ALIGNMENT:   LUMBARAROM/PROM   A/PROM A/PROM  eval  Flexion Decreased by 50%  Extension Decreased by 50%  Right lateral flexion Decreased by 50%  Left lateral flexion Decreased by 50%  Right rotation Decreased by 50%  Left rotation Decreased by 50%   (Blank rows = not tested)   LOWER EXTREMITY ROM: bilateral hip ROM is full.      LOWER EXTREMITY MMT:   MMT Right eval Left eval  Hip flexion 4/5 4/5  Hip extension 4/5 4/5  Hip abduction 4/5 4/5     PALPATION:   General  tenderness throughout the abdomen                 External Perineal Exam labia minora is fused to the labia majora,                              Internal Pelvic Floor redness located on the bottom half of the urethra, tenderness located on levator ani and obturator internist, along the right side of the introitus.    Patient confirms identification and approves PT to assess internal pelvic floor and treatment Yes   PELVIC MMT:   MMT eval  Vaginal 3/5  Internal Anal Sphincter 2/5  External Anal Sphincter 2/5  Puborectalis 2/5  (Blank rows = not tested) No emotional/communication barriers or cognitive limitation. Patient is motivated to learn. Patient understands and agrees with treatment goals and plan. PT explains patient will be examined in standing, sitting, and lying down to see how their muscles and joints work. When they are ready, they will be asked to remove their underwear so PT can examine their perineum. The patient is also given the  option of providing their own chaperone as one is not provided in our facility. The patient also has the right and is explained the right to defer or refuse any part of the evaluation or treatment including the internal exam. With the patient's consent, PT will use one gloved finger to gently assess the muscles  of the pelvic floor, seeing how well it contracts and relaxes and if there is muscle symmetry. After, the patient will get dressed and PT and patient will discuss exam findings and plan of care. PT and patient discuss plan of care, schedule, attendance policy and HEP activities.         TONE: Increased tone vaginally   PROLAPSE: none   TODAY'S TREATMENT  07/28/2022 Manual: Scar massage to lower abdomen with pulling up on the scar, moving the tissue, and going through the restrictions.  Manual work along the suprapubic bone to release the thick tissue Tissue rolling of the abdomen Using the suction cup on the scar and throughout the abdomen pulling the skin off the muscle Fascial release along the mesenteric root, along the upper abdomen, along the sides of the abdomen Manual work to the diaphragm  07/12/2022 Manual: Soft tissue mobilization:circular massage to the abdomen to improve peristalic motion of the intestines and improve swelling of the abdomen, manual work to the diaphragm Scar tissue mobilization:mobilization:to the hysterectomy scar that is suprapubically Myofascial release:release along the mesenteric root, lifting up the intestines off the bladder, tissue rolling of the abdomen, release along the bladder; Patient in sidely with therapist working on the obliques and lower abdominal area to release the fascial restrictions; release of the inguinal canal   Neuromuscular re-education: Core retraining: diaphragmatic breathing with therapist giving tactile cues to the rib cage to expand correctly, breathing in supine, sidely and standing to open up the abdomen and lower rib  cage.     07/05/2022 Manual: Soft tissue mobilization:circular massage to the abdomen to improve peristalic motion of the intestines and improve swelling of the abdomen, manual work to the diaphragm Scar tissue mobilization:to the hysterectomy scar that is suprapubically Myofascial release: using the suction cup to the abdomen to release the fascia, release along the mesenteric root, lifting up the intestines off the bladder, tissue rolling of the abdomen, release along the bladder     EVAL Date:  HEP established-see below  Education on vaginal moisturizers Pelvic floor contraction       PATIENT EDUCATION:  06/12/2022 Education details: education on vaginal moisturizers, gave 2 samples of moisturizers, Access Code: [email or web address] Person educated: Patient Education method: Explanation, Demonstration, Tactile cues, Verbal cues, and Handouts Education comprehension: verbalized understanding, returned demonstration, verbal cues required, tactile cues required, and needs further education     HOME EXERCISE PROGRAM: 06/12/2022 Access Code: [email or web address] URL: [email or web address] Date: 06/12/2022 Prepared by: Earlie Counts   Exercises - Supine Pelvic Floor Contraction  - 3 x daily - 7 x weekly - 1 sets - 10 reps - 5 sec hold   ASSESSMENT:   CLINICAL IMPRESSION: Patient is a 76 y.o. female who was seen today for physical therapy  treatment for fecal incontinence.  Patient has not had issue with fecal leakage until last night. She was on the commode for 1 hour and had to wipe frequently. She was not able to do her exercises for 5 days due to back pain. This is better since she had her shots in the back yesterday. Patient had increased tissue mobility after the manual work. She still has some restrictions on the scar suprapubically.  Patient has not had urinary leakage since last visit. Patient will benefit from skilled therapy to improve pelvic floor coordination, reduce pain and improve  leakage.      OBJECTIVE IMPAIRMENTS decreased activity tolerance, decreased coordination, decreased endurance, decreased strength, increased fascial restrictions, increased muscle spasms, impaired tone,  and pain.    ACTIVITY LIMITATIONS lifting, bending, continence, and toileting   PARTICIPATION LIMITATIONS: cleaning, interpersonal relationship, driving, and community activity   PERSONAL FACTORS Breast cancer 07/2012 with radiation 11/12/2012-12.12.2013, CAD (coronary artery disease); Chronic kidney disease stage 3; Fibromyalgia; Sjogren's disease ; Osteoporosis; TOTAL ABDOMINAL HYSTERECTOMY W/ BILATERAL SALPINGOOPHORECTOMY 1982 are also affecting patient's functional outcome.    REHAB POTENTIAL: Excellent   CLINICAL DECISION MAKING: Evolving/moderate complexity   EVALUATION COMPLEXITY: Moderate     GOALS: Goals reviewed with patient? Yes   SHORT TERM GOALS: Target date: 07/10/2022   Patient educated on vaginal moisturizers and lubricant to improve vaginal health.  Baseline: Goal status: met 07/05/2022   2.  Patient independent with initial HEP for pelvic floor relaxation, contraction and abdominal work.  Baseline:  Goal status: met 07/28/2022   3.  Patient reports her abdominal pain is </= 25%.  Baseline:  Goal status: Met 07/12/2022    LONG TERM GOALS: Target date: 09/04/2022    Patient independent with advanced HEP for pelvic floor, core, and hip strength.  Baseline:  Goal status:ongoing 07/28/2022  2.  Patient reports her urinary leakage has improved >/= 75% due to improved strength and reduction of pain.  Baseline: no leakage Goal status: met 07/28/2022   3.  Patient reports her fecal leakage has improved >/= 75% due to increased in rectal strength.  Baseline:  Goal status: ongoing 07/28/2022   4.  Patient reports her abdominal pain has improved >/= 50%.  Baseline:  Goal status: ongoing 07/28/2022  5.  Patient is able to have penile penetration with 50% less pain due to  improve vaginal moisture and reduction of trigger points in the pelvic floor.  Baseline:  Goal status: ongoing 07/28/2022     PLAN: PT FREQUENCY: 1x/week   PT DURATION: 12 weeks   PLANNED INTERVENTIONS: Therapeutic exercises, Therapeutic activity, Neuromuscular re-education, Patient/Family education, Joint mobilization, Electrical stimulation, Cryotherapy, Moist heat, Biofeedback, and Manual therapy   PLAN FOR NEXT SESSION:  manual work to abdomen and education on abdominal massage, work on abdominal contraction , update HEP   Earlie Counts, PT 07/28/22 11:13 AM

## 2022-08-01 DIAGNOSIS — N3010 Interstitial cystitis (chronic) without hematuria: Secondary | ICD-10-CM | POA: Diagnosis not present

## 2022-08-02 ENCOUNTER — Ambulatory Visit: Payer: Medicare Other | Admitting: Physical Therapy

## 2022-08-04 ENCOUNTER — Other Ambulatory Visit: Payer: Self-pay | Admitting: Cardiology

## 2022-08-04 DIAGNOSIS — E785 Hyperlipidemia, unspecified: Secondary | ICD-10-CM

## 2022-08-04 DIAGNOSIS — I2510 Atherosclerotic heart disease of native coronary artery without angina pectoris: Secondary | ICD-10-CM

## 2022-08-09 ENCOUNTER — Ambulatory Visit: Payer: Medicare Other | Admitting: Physical Therapy

## 2022-08-09 ENCOUNTER — Encounter: Payer: Self-pay | Admitting: Physical Therapy

## 2022-08-09 DIAGNOSIS — R278 Other lack of coordination: Secondary | ICD-10-CM | POA: Diagnosis not present

## 2022-08-09 DIAGNOSIS — R1084 Generalized abdominal pain: Secondary | ICD-10-CM

## 2022-08-09 DIAGNOSIS — M6281 Muscle weakness (generalized): Secondary | ICD-10-CM

## 2022-08-09 NOTE — Therapy (Signed)
OUTPATIENT PHYSICAL THERAPY TREATMENT NOTE   Patient Name: [name] MRN: [id number] DOB:1946-11-18, 76 y.o., female Today's Date: 08/09/2022  PCP: Mosie Lukes, MD REFERRING PROVIDER: Ileana Roup, MD  END OF SESSION:   PT End of Session - 08/09/22 0849     Visit Number 5    Date for PT Re-Evaluation 09/04/22    Authorization Type [employer] medicare    Authorization - Visit Number 5    Authorization - Number of Visits 10    PT Start Time 0845    PT Stop Time 0925    PT Time Calculation (min) 40 min    Activity Tolerance Patient tolerated treatment well    Behavior During Therapy WFL for tasks assessed/performed             Past Medical History:  Diagnosis Date   Allergy    Anemia    Anxiety    past hx    Arthritis    Asthma    Atrial flutter ([place])    past history- not current   Breast cancer ([place])    CAD (coronary artery disease) CARDIOLOGIST--  DR Angelena Form   mild non-obstructive cad   Cancer ([place])    right   Cataract    bilaterally removed    Chronic constipation    Chronic kidney disease    stage 3 ckd, interstitial cystitis   Concussion    x 3   COPD (chronic obstructive pulmonary disease) ([place])    Depression    past hx    Family history of malignant hyperthermia    father had this   Fibromyalgia    Frequency of urination    GERD (gastroesophageal reflux disease)    H/O hiatal hernia    History of basal cell carcinoma excision    X2   History of breast cancer ONCOLOGIST-- DR Jana Hakim---  NO RECURRANCE   DX 07/2012;  LOW GRADE DCIS  ER+PR+  ----  S/P RIGHT LUMPECTOMY WITH NEGATIVE MARGINS/   RADIATION ENDED 11/2012   History of chronic bronchitis    History of colonic polyps    History of tachycardia    CONTROLLED  WITH ATENOLOL   Hyperlipidemia    Hypertension    Neuromuscular disorder ([place])    fibromyalgia   Neuropathy    Osteoporosis 01/2019   T score -2.2 stable/improved from prior study   Pelvic pain    Personal history of  radiation therapy    S/P radiation therapy 11/12/12 - 12/05/12   right Breast   Sepsis ([place]) 2014   from UTI    Sinus headache    Sjogren's disease ([place])    Urgency of urination    Past Surgical History:  Procedure Laterality Date   22 HOUR [place] STUDY N/A 04/03/2016   Procedure: 24 HOUR [place] STUDY;  Surgeon: Manus Gunning, MD;  Location: [facility];  Service: Gastroenterology;  Laterality: N/A;   BREAST BIOPSY Right 08/23/2012   ADH   BREAST BIOPSY Right 10/11/2012   Ductal Carcinoma   BREAST EXCISIONAL BIOPSY Right 09/04/2013   benign   BREAST LUMPECTOMY Right 10-11-2012   W/ SLN BX   CARDIAC CATHETERIZATION  09-13-2007  DR Lia Foyer   WELL-PRESERVED LVF/  DIFFUSE SCATTERED CORONARY CALCIFACATION AND ATHEROSCLEROSIS WITHOUT OBSTRUCTION   CARDIAC CATHETERIZATION  08-04-2010  DR MCALHANY   NON-OBSTRUCTIVE CAD/  pLAD 40%/  oLAD 30%/  mLAD 30%/  pRCA 30%/  EF 60%   CARDIOVASCULAR STRESS TEST  06-18-2012  DR  McALHANY   LOW RISK NUCLEAR STUDY/  SMALL FIXED AREA OF MODERATELY DECREASED UPTAKE IN ANTEROSEPTAL WALL WHICH MAY BE ARTIFACTUAL/  NO ISCHEMIA/  EF 68%   COLONOSCOPY  09-29-2010   CYSTOSCOPY     CYSTOSCOPY WITH HYDRODISTENSION AND BIOPSY N/A 03/06/2014   Procedure: CYSTOSCOPY/HYDRODISTENSION/ INSTILATION OF MARCAINE AND PYRIDIUM;  Surgeon: Ailene Rud, MD;  Location: [facility];  Service: Urology;  Laterality: N/A;   ESOPHAGEAL MANOMETRY N/A 04/03/2016   Procedure: ESOPHAGEAL MANOMETRY (EM);  Surgeon: Manus Gunning, MD;  Location: [facility];  Service: Gastroenterology;  Laterality: N/A;   EXTRACORPOREAL SHOCK WAVE LITHOTRIPSY Left 02/06/2019   Procedure: EXTRACORPOREAL SHOCK WAVE LITHOTRIPSY (ESWL);  Surgeon: Kathie Rhodes, MD;  Location: [facility];  Service: Urology;  Laterality: Left;   NASAL SINUS SURGERY  1985   ORIF RIGHT ANKLE  FX  2006   POLYPECTOMY     REMOVAL VOCAL CORD CYST  FEB 2014   RIGHT BREAST BX  08-23-2012   RIGHT HAND  SURGERY  X3  LAST ONE 2009   INCLUDES  ORIF RIGHT 5TH FINGER AND REVISION TWICE   SKIN CANCER EXCISION     TONSILLECTOMY AND ADENOIDECTOMY  AGE 32   TOTAL ABDOMINAL HYSTERECTOMY W/ BILATERAL SALPINGOOPHORECTOMY  1982   W/  APPENDECTOMY   TRANSTHORACIC ECHOCARDIOGRAM  06-24-2012   GRADE I DIASTOLIC DYSFUNCTION/  EF 55-60%/  MILD MR   UPPER GASTROINTESTINAL ENDOSCOPY     Patient Active Problem List   Diagnosis Date Noted   Tremor 07/05/2022   RUQ pain 07/05/2022   Leukocytosis 07/05/2022   Chronic renal insufficiency 03/21/2022   Sjogren's disease ([place]) 12/12/2021   Vocal cord cyst 05/18/2021   High serum vitamin D 05/18/2021   Dysuria 03/09/2021   Vitamin B12 deficiency 03/08/2021   Preventative health care 03/08/2021   Hyperglycemia 03/08/2021   Low back pain 09/08/2020   OSA and COPD overlap syndrome ([place]) 06/10/2020   Serotonin withdrawal syndrome without complication 33/54/5625   Recurrent falls 05/02/2020   Statin intolerance 02/29/2020   RLS (restless legs syndrome) 11/09/2019   Kidney stone 02/26/2019   Recurrent sinusitis 02/25/2019   Hx of colonic polyp 02/25/2019   DDD (degenerative disc disease), cervical 09/17/2018   Degenerative scoliosis 04/22/2018   Primary insomnia 06/25/2017   Chronic interstitial cystitis 02/01/2017   Chronic cough 01/09/2017   Right arm pain 07/20/2016   Deviated septum 05/12/2016   Nasal turbinate hypertrophy 05/12/2016   Dysphagia    Allergic rhinitis 01/25/2016   Other and combined forms of senile cataract 07/15/2015   Dyspnea    Left hip pain 04/30/2015   Sciatica 04/30/2015   Knee pain, right 04/30/2015   Bilateral carpal tunnel syndrome 03/04/2015   Hyperlipidemia 03/01/2015   Pulmonary nodules 02/12/2015   External hemorrhoids 02/05/2015   Chronic night sweats 01/08/2015   Atypical chest pain 01/01/2015   Renal insufficiency 07/16/2014   Memory loss 05/22/2014   Peripheral neuropathy 05/22/2014   Abdominal pain  10/26/2013   Headache 10/13/2013   Arthralgia 08/18/2013   ANA positive 07/29/2013   Depression 02/05/2013   Family history of malignant hyperthermia 02/05/2013   Malignant neoplasm of lower-outer quadrant of right breast of female, estrogen receptor positive ([place]) 01/03/2013   Splenic lesion 09/02/2012   Asthma, allergic 08/05/2012   GERD (gastroesophageal reflux disease) 06/03/2012   Edema 04/08/2012   Stricture and stenosis of esophagus 10/19/2011   Functional constipation 07/21/2011   Nonspecific (abnormal) findings on radiological and other examination of  biliary tract 07/21/2011   Osteoporosis 04/17/2011   Leg pain 02/24/2011   FIBROCYSTIC BREAST DISEASE, HX OF 10/18/2010   Essential hypertension 08/25/2010   CAD in native artery 08/25/2010   TOBACCO ABUSE, HX OF 08/25/2010   GENERALIZED ANXIETY DISORDER 08/03/2010   Fibromyalgia 01/11/2010  REFERRING DIAG: R15.9 (ICD-10-CM) - Full incontinence of feces   THERAPY DIAG:  Muscle weakness (generalized)   Other lack of coordination   Generalized abdominal pain   Rationale for Evaluation and Treatment Rehabilitation   ONSET DATE: 2019   SUBJECTIVE:                                                                                                                                                                                            SUBJECTIVE STATEMENT:  I am bloated and need to go to the bathroom. My back pain is increased. The shot helped for several days.  PAIN:  Are you having pain? Yes NPRS scale: 7/10 Pain location:  abdomen   Pain type: stabbing, hot Pain description: constant    Aggravating factors: when stomach is bloated, touching the abdomen Relieving factors: when completely empty from stool,  PAIN:  Are you having pain? Yes: NPRS scale: 9/10 Pain location: low back Pain description: intermittent Aggravating factors: movement ,standing up, supine to sitting Relieving factors: laying down      PRECAUTIONS: Other: Breast cancer with radiation   WEIGHT BEARING RESTRICTIONS No   FALLS:  Has patient fallen in last 6 months? No, cane is helping   LIVING ENVIRONMENT: Lives with: lives with their family   OCCUPATION: retired   PLOF: Independent   PATIENT GOALS reduce leakage and abdominal pain, increase strength of anal sphincter   PERTINENT HISTORY:  Breast cancer 07/2012 with radiation 11/12/2012-12.12.2013, CAD (coronary artery disease); Chronic kidney disease stage 3; Fibromyalgia; Sjogren's disease ; Osteoporosis; TOTAL ABDOMINAL HYSTERECTOMY W/ BILATERAL SALPINGOOPHORECTOMY 1982     BOWEL MOVEMENT Pain with bowel movement: Yes, due to hemorrhoids that bleed and are painful Type of bowel movement:Type (Bristol Stool Scale) Type 1, 4, 5, 7, Frequency 1-5 days, and Strain Yes by holding abdomen and leaning forward.  Fully empty rectum: No Leakage: Yes: 1 accident per day , gets fecal urgency Pads: None Fiber supplement: Yes: miralax   URINATION Pain with urination: Yes, due to the UTI Fully empty bladder: No, push pushes the suprapubic area and get 1/4 of a teacup Stream: Strong and Weak Urgency: yes when she is out  Frequency: urinate every 2-3 hours Leakage: Urge to void, Walking to the bathroom, Coughing, Sneezing, Laughing, Lifting, and Bending forward Pads: Yes: 1 depends per  day   INTERCOURSE Pain with intercourse: Initial Penetration, uses moisturizers around the vaginal canal Ability to have vaginal penetration:  No Climax: has not had a climax for a long time Marinoff Scale: 3/3   PREGNANCY Vaginal deliveries 3 Tearing episiotomy 3 times   PROLAPSE None       OBJECTIVE:      PATIENT SURVEYS:  PFIQ-7 171, UIQ-7 57, CRAIQ-7 52, POPIQ-7 62   COGNITION:            Overall cognitive status: Within functional limits for tasks assessed                              GAIT: Distance walked: walks with a cane slowly Assistive device utilized:  Single point cane Level of assistance: Complete Independence                POSTURE: decreased lumbar lordosis, scoliosis, left ilium higher than right               PELVIC ALIGNMENT:   LUMBARAROM/PROM   A/PROM A/PROM  eval  Flexion Decreased by 50%  Extension Decreased by 50%  Right lateral flexion Decreased by 50%  Left lateral flexion Decreased by 50%  Right rotation Decreased by 50%  Left rotation Decreased by 50%   (Blank rows = not tested)   LOWER EXTREMITY ROM: bilateral hip ROM is full.      LOWER EXTREMITY MMT:   MMT Right eval Left eval Right 08/09/2022 Left 08/09/2022  Hip flexion 4/5 4/5 4/5 4/5  Hip extension 4/5 4/5 4/5 4/5  Hip abduction 4/5 4/5 4/5 4/5     PALPATION:   General  tenderness throughout the abdomen                 External Perineal Exam labia minora is fused to the labia majora,                              Internal Pelvic Floor redness located on the bottom half of the urethra, tenderness located on levator ani and obturator internist, along the right side of the introitus.    Patient confirms identification and approves PT to assess internal pelvic floor and treatment Yes   PELVIC MMT:   MMT eval  Vaginal 3/5  Internal Anal Sphincter 2/5  External Anal Sphincter 2/5  Puborectalis 2/5  (Blank rows = not tested) No emotional/communication barriers or cognitive limitation. Patient is motivated to learn. Patient understands and agrees with treatment goals and plan. PT explains patient will be examined in standing, sitting, and lying down to see how their muscles and joints work. When they are ready, they will be asked to remove their underwear so PT can examine their perineum. The patient is also given the option of providing their own chaperone as one is not provided in our facility. The patient also has the right and is explained the right to defer or refuse any part of the evaluation or treatment including the internal exam. With the  patient's consent, PT will use one gloved finger to gently assess the muscles of the pelvic floor, seeing how well it contracts and relaxes and if there is muscle symmetry. After, the patient will get dressed and PT and patient will discuss exam findings and plan of care. PT and patient discuss plan of care, schedule, attendance policy and HEP activities.  TONE: Increased tone vaginally   PROLAPSE: none   TODAY'S TREATMENT  08/09/2022 Manual: Soft tissue mobilization:Manual work along the suprapubic bone to release the thick tissue, manual work along the umbilicus and obliques.  Scar tissue mobilization:to lower abdomen with pulling up on the scar, moving the tissue, and going through the restrictions.  Myofascial release:Tissue rolling of the abdomen, Fascial release along the mesenteric root, along the upper abdomen, along the sides of the abdomen; release of the bladder and pubovesical ligament, release of the fascial along the midline of the abdomen.      07/28/2022 Manual: Scar massage to lower abdomen with pulling up on the scar, moving the tissue, and going through the restrictions.  Manual work along the suprapubic bone to release the thick tissue Tissue rolling of the abdomen Using the suction cup on the scar and throughout the abdomen pulling the skin off the muscle Fascial release along the mesenteric root, along the upper abdomen, along the sides of the abdomen Manual work to the diaphragm   07/12/2022 Manual: Soft tissue mobilization:circular massage to the abdomen to improve peristalic motion of the intestines and improve swelling of the abdomen, manual work to the diaphragm Scar tissue mobilization:mobilization:to the hysterectomy scar that is suprapubically Myofascial release:release along the mesenteric root, lifting up the intestines off the bladder, tissue rolling of the abdomen, release along the bladder; Patient in sidely with therapist working on the obliques  and lower abdominal area to release the fascial restrictions; release of the inguinal canal   Neuromuscular re-education: Core retraining: diaphragmatic breathing with therapist giving tactile cues to the rib cage to expand correctly, breathing in supine, sidely and standing to open up the abdomen and lower rib cage.        PATIENT EDUCATION:  06/12/2022 Education details: education on vaginal moisturizers, gave 2 samples of moisturizers, Access Code: [email or web address] Person educated: Patient Education method: Explanation, Demonstration, Tactile cues, Verbal cues, and Handouts Education comprehension: verbalized understanding, returned demonstration, verbal cues required, tactile cues required, and needs further education     HOME EXERCISE PROGRAM: 06/12/2022 Access Code: [email or web address] URL: [email or web address] Date: 06/12/2022 Prepared by: Eulis Foster   Exercises - Supine Pelvic Floor Contraction  - 3 x daily - 7 x weekly - 1 sets - 10 reps - 5 sec hold   ASSESSMENT:   CLINICAL IMPRESSION: Patient is a 76 y.o. female who was seen today for physical therapy  treatment for fecal incontinence.  Patient has had no fecal leakage since last visit. Today she is bloated and feels constipated. Patient abdominal bloating was reduced so her shirt was not as tight along the midsection. She had increased tissue mobility of the abdomen and reduction of pressure. Bilateral hip strength remains the same but difficult to exercise due to her back hurting. Patient will benefit from skilled therapy to improve pelvic floor coordination, reduce pain and improve leakage.      OBJECTIVE IMPAIRMENTS decreased activity tolerance, decreased coordination, decreased endurance, decreased strength, increased fascial restrictions, increased muscle spasms, impaired tone, and pain.    ACTIVITY LIMITATIONS lifting, bending, continence, and toileting   PARTICIPATION LIMITATIONS: cleaning, interpersonal relationship,  driving, and community activity   PERSONAL FACTORS Breast cancer 07/2012 with radiation 11/12/2012-12.12.2013, CAD (coronary artery disease); Chronic kidney disease stage 3; Fibromyalgia; Sjogren's disease ; Osteoporosis; TOTAL ABDOMINAL HYSTERECTOMY W/ BILATERAL SALPINGOOPHORECTOMY 1982 are also affecting patient's functional outcome.    REHAB POTENTIAL: Excellent   CLINICAL DECISION MAKING: Evolving/moderate complexity   EVALUATION COMPLEXITY: Moderate  GOALS: Goals reviewed with patient? Yes   SHORT TERM GOALS: Target date: 07/10/2022   Patient educated on vaginal moisturizers and lubricant to improve vaginal health.  Baseline: Goal status: met 07/05/2022   2.  Patient independent with initial HEP for pelvic floor relaxation, contraction and abdominal work.  Baseline:  Goal status: met 07/28/2022   3.  Patient reports her abdominal pain is </= 25%.  Baseline:  Goal status: Met 07/12/2022    LONG TERM GOALS: Target date: 09/04/2022    Patient independent with advanced HEP for pelvic floor, core, and hip strength.  Baseline:  Goal status:ongoing 07/28/2022  2.  Patient reports her urinary leakage has improved >/= 75% due to improved strength and reduction of pain.  Baseline: no leakage Goal status: met 07/28/2022   3.  Patient reports her fecal leakage has improved >/= 75% due to increased in rectal strength.  Baseline: not fecal leakage since last visit.  Goal status: ongoing 07/28/2022   4.  Patient reports her abdominal pain has improved >/= 50%.  Baseline:  Goal status: ongoing 07/28/2022   5.  Patient is able to have penile penetration with 50% less pain due to improve vaginal moisture and reduction of trigger points in the pelvic floor.  Baseline:  Goal status: ongoing 07/28/2022     PLAN: PT FREQUENCY: 1x/week   PT DURATION: 12 weeks   PLANNED INTERVENTIONS: Therapeutic exercises, Therapeutic activity, Neuromuscular re-education, Patient/Family education, Joint  mobilization, Electrical stimulation, Cryotherapy, Moist heat, Biofeedback, and Manual therapy   PLAN FOR NEXT SESSION:  manual work to abdomen and education on abdominal massage, work on abdominal contraction , update HEP   Earlie Counts, PT 08/09/22 9:29 AM

## 2022-08-10 DIAGNOSIS — G894 Chronic pain syndrome: Secondary | ICD-10-CM | POA: Diagnosis not present

## 2022-08-10 DIAGNOSIS — M5416 Radiculopathy, lumbar region: Secondary | ICD-10-CM | POA: Diagnosis not present

## 2022-08-10 DIAGNOSIS — M5136 Other intervertebral disc degeneration, lumbar region: Secondary | ICD-10-CM | POA: Diagnosis not present

## 2022-08-15 ENCOUNTER — Other Ambulatory Visit: Payer: Self-pay | Admitting: Family Medicine

## 2022-08-15 MED ORDER — DULOXETINE HCL 30 MG PO CPEP
30.0000 mg | ORAL_CAPSULE | Freq: Every day | ORAL | 1 refills | Status: DC
Start: 2022-08-15 — End: 2023-07-16

## 2022-08-16 ENCOUNTER — Ambulatory Visit: Payer: Medicare Other | Admitting: Physical Therapy

## 2022-08-16 ENCOUNTER — Encounter: Payer: Self-pay | Admitting: Physical Therapy

## 2022-08-16 DIAGNOSIS — M6281 Muscle weakness (generalized): Secondary | ICD-10-CM | POA: Diagnosis not present

## 2022-08-16 DIAGNOSIS — N183 Chronic kidney disease, stage 3 unspecified: Secondary | ICD-10-CM | POA: Diagnosis not present

## 2022-08-16 DIAGNOSIS — R1084 Generalized abdominal pain: Secondary | ICD-10-CM | POA: Diagnosis not present

## 2022-08-16 DIAGNOSIS — R278 Other lack of coordination: Secondary | ICD-10-CM | POA: Diagnosis not present

## 2022-08-16 NOTE — Therapy (Signed)
OUTPATIENT PHYSICAL THERAPY TREATMENT NOTE   Patient Name: [name] MRN: [id number] DOB:03-Sep-1946, 76 y.o., female Today's Date: 08/16/2022  PCP: Mosie Lukes, MD REFERRING PROVIDER: Ileana Roup, MD  END OF SESSION:   PT End of Session - 08/16/22 0854     Visit Number 6    Date for PT Re-Evaluation 09/04/22    Authorization Type [employer] medicare    Authorization - Visit Number 6    Authorization - Number of Visits 10    PT Start Time 5188    PT Stop Time 0928    PT Time Calculation (min) 38 min    Activity Tolerance Patient tolerated treatment well    Behavior During Therapy WFL for tasks assessed/performed             Past Medical History:  Diagnosis Date   Allergy    Anemia    Anxiety    past hx    Arthritis    Asthma    Atrial flutter ([place])    past history- not current   Breast cancer ([place])    CAD (coronary artery disease) CARDIOLOGIST--  DR Angelena Form   mild non-obstructive cad   Cancer ([place])    right   Cataract    bilaterally removed    Chronic constipation    Chronic kidney disease    stage 3 ckd, interstitial cystitis   Concussion    x 3   COPD (chronic obstructive pulmonary disease) ([place])    Depression    past hx    Family history of malignant hyperthermia    father had this   Fibromyalgia    Frequency of urination    GERD (gastroesophageal reflux disease)    H/O hiatal hernia    History of basal cell carcinoma excision    X2   History of breast cancer ONCOLOGIST-- DR Jana Hakim---  NO RECURRANCE   DX 07/2012;  LOW GRADE DCIS  ER+PR+  ----  S/P RIGHT LUMPECTOMY WITH NEGATIVE MARGINS/   RADIATION ENDED 11/2012   History of chronic bronchitis    History of colonic polyps    History of tachycardia    CONTROLLED  WITH ATENOLOL   Hyperlipidemia    Hypertension    Neuromuscular disorder ([place])    fibromyalgia   Neuropathy    Osteoporosis 01/2019   T score -2.2 stable/improved from prior study   Pelvic pain    Personal history of  radiation therapy    S/P radiation therapy 11/12/12 - 12/05/12   right Breast   Sepsis ([place]) 2014   from UTI    Sinus headache    Sjogren's disease ([place])    Urgency of urination    Past Surgical History:  Procedure Laterality Date   62 HOUR [place] STUDY N/A 04/03/2016   Procedure: 24 HOUR [place] STUDY;  Surgeon: Manus Gunning, MD;  Location: [facility];  Service: Gastroenterology;  Laterality: N/A;   BREAST BIOPSY Right 08/23/2012   ADH   BREAST BIOPSY Right 10/11/2012   Ductal Carcinoma   BREAST EXCISIONAL BIOPSY Right 09/04/2013   benign   BREAST LUMPECTOMY Right 10-11-2012   W/ SLN BX   CARDIAC CATHETERIZATION  09-13-2007  DR Lia Foyer   WELL-PRESERVED LVF/  DIFFUSE SCATTERED CORONARY CALCIFACATION AND ATHEROSCLEROSIS WITHOUT OBSTRUCTION   CARDIAC CATHETERIZATION  08-04-2010  DR MCALHANY   NON-OBSTRUCTIVE CAD/  pLAD 40%/  oLAD 30%/  mLAD 30%/  pRCA 30%/  EF 60%   CARDIOVASCULAR STRESS TEST  06-18-2012  DR  McALHANY   LOW RISK NUCLEAR STUDY/  SMALL FIXED AREA OF MODERATELY DECREASED UPTAKE IN ANTEROSEPTAL WALL WHICH MAY BE ARTIFACTUAL/  NO ISCHEMIA/  EF 68%   COLONOSCOPY  09-29-2010   CYSTOSCOPY     CYSTOSCOPY WITH HYDRODISTENSION AND BIOPSY N/A 03/06/2014   Procedure: CYSTOSCOPY/HYDRODISTENSION/ INSTILATION OF MARCAINE AND PYRIDIUM;  Surgeon: Ailene Rud, MD;  Location: [facility];  Service: Urology;  Laterality: N/A;   ESOPHAGEAL MANOMETRY N/A 04/03/2016   Procedure: ESOPHAGEAL MANOMETRY (EM);  Surgeon: Manus Gunning, MD;  Location: [facility];  Service: Gastroenterology;  Laterality: N/A;   EXTRACORPOREAL SHOCK WAVE LITHOTRIPSY Left 02/06/2019   Procedure: EXTRACORPOREAL SHOCK WAVE LITHOTRIPSY (ESWL);  Surgeon: Kathie Rhodes, MD;  Location: [facility];  Service: Urology;  Laterality: Left;   NASAL SINUS SURGERY  1985   ORIF RIGHT ANKLE  FX  2006   POLYPECTOMY     REMOVAL VOCAL CORD CYST  FEB 2014   RIGHT BREAST BX  08-23-2012   RIGHT HAND  SURGERY  X3  LAST ONE 2009   INCLUDES  ORIF RIGHT 5TH FINGER AND REVISION TWICE   SKIN CANCER EXCISION     TONSILLECTOMY AND ADENOIDECTOMY  AGE 32   TOTAL ABDOMINAL HYSTERECTOMY W/ BILATERAL SALPINGOOPHORECTOMY  1982   W/  APPENDECTOMY   TRANSTHORACIC ECHOCARDIOGRAM  06-24-2012   GRADE I DIASTOLIC DYSFUNCTION/  EF 55-60%/  MILD MR   UPPER GASTROINTESTINAL ENDOSCOPY     Patient Active Problem List   Diagnosis Date Noted   Tremor 07/05/2022   RUQ pain 07/05/2022   Leukocytosis 07/05/2022   Chronic renal insufficiency 03/21/2022   Sjogren's disease ([place]) 12/12/2021   Vocal cord cyst 05/18/2021   High serum vitamin D 05/18/2021   Dysuria 03/09/2021   Vitamin B12 deficiency 03/08/2021   Preventative health care 03/08/2021   Hyperglycemia 03/08/2021   Low back pain 09/08/2020   OSA and COPD overlap syndrome ([place]) 06/10/2020   Serotonin withdrawal syndrome without complication 33/54/5625   Recurrent falls 05/02/2020   Statin intolerance 02/29/2020   RLS (restless legs syndrome) 11/09/2019   Kidney stone 02/26/2019   Recurrent sinusitis 02/25/2019   Hx of colonic polyp 02/25/2019   DDD (degenerative disc disease), cervical 09/17/2018   Degenerative scoliosis 04/22/2018   Primary insomnia 06/25/2017   Chronic interstitial cystitis 02/01/2017   Chronic cough 01/09/2017   Right arm pain 07/20/2016   Deviated septum 05/12/2016   Nasal turbinate hypertrophy 05/12/2016   Dysphagia    Allergic rhinitis 01/25/2016   Other and combined forms of senile cataract 07/15/2015   Dyspnea    Left hip pain 04/30/2015   Sciatica 04/30/2015   Knee pain, right 04/30/2015   Bilateral carpal tunnel syndrome 03/04/2015   Hyperlipidemia 03/01/2015   Pulmonary nodules 02/12/2015   External hemorrhoids 02/05/2015   Chronic night sweats 01/08/2015   Atypical chest pain 01/01/2015   Renal insufficiency 07/16/2014   Memory loss 05/22/2014   Peripheral neuropathy 05/22/2014   Abdominal pain  10/26/2013   Headache 10/13/2013   Arthralgia 08/18/2013   ANA positive 07/29/2013   Depression 02/05/2013   Family history of malignant hyperthermia 02/05/2013   Malignant neoplasm of lower-outer quadrant of right breast of female, estrogen receptor positive ([place]) 01/03/2013   Splenic lesion 09/02/2012   Asthma, allergic 08/05/2012   GERD (gastroesophageal reflux disease) 06/03/2012   Edema 04/08/2012   Stricture and stenosis of esophagus 10/19/2011   Functional constipation 07/21/2011   Nonspecific (abnormal) findings on radiological and other examination of  biliary tract 07/21/2011   Osteoporosis 04/17/2011   Leg pain 02/24/2011   FIBROCYSTIC BREAST DISEASE, HX OF 10/18/2010   Essential hypertension 08/25/2010   CAD in native artery 08/25/2010   TOBACCO ABUSE, HX OF 08/25/2010   GENERALIZED ANXIETY DISORDER 08/03/2010   Fibromyalgia 01/11/2010   REFERRING DIAG: R15.9 (ICD-10-CM) - Full incontinence of feces   THERAPY DIAG:  Muscle weakness (generalized)   Other lack of coordination   Generalized abdominal pain   Rationale for Evaluation and Treatment Rehabilitation   ONSET DATE: 2019   SUBJECTIVE:                                                                                                                                                                                            SUBJECTIVE STATEMENT:  I am still having trouble going to the bathroom regularly. I take the Miralax When I pass gas I will pas liquid stool. I am taking a half of pain medication in the morning and at night.  PAIN:  Are you having pain? Yes NPRS scale: 7/10 Pain location:  abdomen   Pain type: stabbing, hot Pain description: constant    Aggravating factors: when stomach is bloated, touching the abdomen Relieving factors: when completely empty from stool,  PAIN:  Are you having pain? Yes: NPRS scale: 9/10 Pain location: low back Pain description: intermittent Aggravating factors:  movement ,standing up, supine to sitting Relieving factors: laying down     PRECAUTIONS: Other: Breast cancer with radiation   WEIGHT BEARING RESTRICTIONS No   FALLS:  Has patient fallen in last 6 months? No, cane is helping   LIVING ENVIRONMENT: Lives with: lives with their family   OCCUPATION: retired   PLOF: Independent   PATIENT GOALS reduce leakage and abdominal pain, increase strength of anal sphincter   PERTINENT HISTORY:  Breast cancer 07/2012 with radiation 11/12/2012-12.12.2013, CAD (coronary artery disease); Chronic kidney disease stage 3; Fibromyalgia; Sjogren's disease ; Osteoporosis; TOTAL ABDOMINAL HYSTERECTOMY W/ BILATERAL SALPINGOOPHORECTOMY 1982     BOWEL MOVEMENT Pain with bowel movement: Yes, due to hemorrhoids that bleed and are painful Type of bowel movement:Type (Bristol Stool Scale) Type 1, 4, 5, 7, Frequency 1-5 days, and Strain Yes by holding abdomen and leaning forward.  Fully empty rectum: No Leakage: Yes: 1 accident per day , gets fecal urgency Pads: None Fiber supplement: Yes: miralax   URINATION Pain with urination: Yes, due to the UTI Fully empty bladder: No, push pushes the suprapubic area and get 1/4 of a teacup Stream: Strong and Weak Urgency: yes when she is out  Frequency: urinate every 2-3 hours Leakage: Urge to  void, Walking to the bathroom, Coughing, Sneezing, Laughing, Lifting, and Bending forward Pads: Yes: 1 depends per day   INTERCOURSE Pain with intercourse: Initial Penetration, uses moisturizers around the vaginal canal Ability to have vaginal penetration:  No Climax: has not had a climax for a long time Marinoff Scale: 3/3   PREGNANCY Vaginal deliveries 3 Tearing episiotomy 3 times   PROLAPSE None       OBJECTIVE:      PATIENT SURVEYS:  PFIQ-7 171, UIQ-7 57, CRAIQ-7 52, POPIQ-7 62   COGNITION:            Overall cognitive status: Within functional limits for tasks assessed                               GAIT: Distance walked: walks with a cane slowly Assistive device utilized: Single point cane Level of assistance: Complete Independence                POSTURE: decreased lumbar lordosis, scoliosis, left ilium higher than right               PELVIC ALIGNMENT:   LUMBARAROM/PROM   A/PROM A/PROM  eval  Flexion Decreased by 50%  Extension Decreased by 50%  Right lateral flexion Decreased by 50%  Left lateral flexion Decreased by 50%  Right rotation Decreased by 50%  Left rotation Decreased by 50%   (Blank rows = not tested)   LOWER EXTREMITY ROM: bilateral hip ROM is full.      LOWER EXTREMITY MMT:   MMT Right eval Left eval Right 08/09/2022 Left 08/09/2022  Hip flexion 4/5 4/5 4/5 4/5  Hip extension 4/5 4/5 4/5 4/5  Hip abduction 4/5 4/5 4/5 4/5     PALPATION:   General  tenderness throughout the abdomen                 External Perineal Exam labia minora is fused to the labia majora,                              Internal Pelvic Floor redness located on the bottom half of the urethra, tenderness located on levator ani and obturator internist, along the right side of the introitus.    Patient confirms identification and approves PT to assess internal pelvic floor and treatment Yes   PELVIC MMT:   MMT eval 08/16/2022  Vaginal 3/5   Internal Anal Sphincter 2/5 3/5  External Anal Sphincter 2/5 3/5  Puborectalis 2/5 3/5  (Blank rows = not tested) No emotional/communication barriers or cognitive limitation. Patient is motivated to learn. Patient understands and agrees with treatment goals and plan. PT explains patient will be examined in standing, sitting, and lying down to see how their muscles and joints work. When they are ready, they will be asked to remove their underwear so PT can examine their perineum. The patient is also given the option of providing their own chaperone as one is not provided in our facility. The patient also has the right and is explained the right  to defer or refuse any part of the evaluation or treatment including the internal exam. With the patient's consent, PT will use one gloved finger to gently assess the muscles of the pelvic floor, seeing how well it contracts and relaxes and if there is muscle symmetry. After, the patient will get dressed and PT and patient will  discuss exam findings and plan of care. PT and patient discuss plan of care, schedule, attendance policy and HEP activities.         TONE: Increased tone vaginally   PROLAPSE: none   TODAY'S TREATMENT  08/16/2022 Manual: Internal pelvic floor techniques:No emotional/communication barriers or cognitive limitation. Patient is motivated to learn. Patient understands and agrees with treatment goals and plan. PT explains patient will be examined in standing, sitting, and lying down to see how their muscles and joints work. When they are ready, they will be asked to remove their underwear so PT can examine their perineum. The patient is also given the option of providing their own chaperone as one is not provided in our facility. The patient also has the right and is explained the right to defer or refuse any part of the evaluation or treatment including the internal exam. With the patient's consent, PT will use one gloved finger to gently assess the muscles of the pelvic floor, seeing how well it contracts and relaxes and if there is muscle symmetry. After, the patient will get dressed and PT and patient will discuss exam findings and plan of care. PT and patient discuss plan of care, schedule, attendance policy and HEP activities.     Going through the vagina performing manual work to the perineal body, along the anococcygeal ligament, along the anal sphincter and levator ani  Neuromuscular re-education: Pelvic floor contraction training:tactile cues to the anus to contract with a lift and not contract the gluteal. Holding 10 sec and fully relaxing then working on quick flicks.     08/09/2022 Manual: Soft tissue mobilization:Manual work along the suprapubic bone to release the thick tissue, manual work along the umbilicus and obliques.  Scar tissue mobilization:to lower abdomen with pulling up on the scar, moving the tissue, and going through the restrictions.  Myofascial release:Tissue rolling of the abdomen, Fascial release along the mesenteric root, along the upper abdomen, along the sides of the abdomen; release of the bladder and pubovesical ligament, release of the fascial along the midline of the abdomen.       07/28/2022 Manual: Scar massage to lower abdomen with pulling up on the scar, moving the tissue, and going through the restrictions.  Manual work along the suprapubic bone to release the thick tissue Tissue rolling of the abdomen Using the suction cup on the scar and throughout the abdomen pulling the skin off the muscle Fascial release along the mesenteric root, along the upper abdomen, along the sides of the abdomen Manual work to the diaphragm   07/12/2022 Manual: Soft tissue mobilization:circular massage to the abdomen to improve peristalic motion of the intestines and improve swelling of the abdomen, manual work to the diaphragm Scar tissue mobilization:mobilization:to the hysterectomy scar that is suprapubically Myofascial release:release along the mesenteric root, lifting up the intestines off the bladder, tissue rolling of the abdomen, release along the bladder; Patient in sidely with therapist working on the obliques and lower abdominal area to release the fascial restrictions; release of the inguinal canal   Neuromuscular re-education: Core retraining: diaphragmatic breathing with therapist giving tactile cues to the rib cage to expand correctly, breathing in supine, sidely and standing to open up the abdomen and lower rib cage.         PATIENT EDUCATION:  08/16/2022 Education details:  Access Code: [email or web address] Person educated:  Patient Education method: Explanation, Demonstration, Tactile cues, Verbal cues, and Handouts Education comprehension: verbalized understanding, returned demonstration, verbal cues required, tactile cues  required, and needs further education     HOME EXERCISE PROGRAM: 08/16/2022  Access Code: [facility] URL: [email or web address] Date: 08/16/2022 Prepared by: Earlie Counts  Exercises - Hooklying Isometric Hip Flexion  - 1 x daily - 7 x weekly - 5 sets - 10 reps - 5 sec hold - Supine Hip Adduction Isometric with Ball  - 1 x daily - 7 x weekly - 1 sets - 10 reps - Seated Pelvic Floor Contraction  - 3 x daily - 7 x weekly - 1 sets - 10 reps - 10 sec hold - Seated Quick Flick Pelvic Floor Contractions  - 3 x daily - 7 x weekly - 1 sets - 5 reps ASSESSMENT:   CLINICAL IMPRESSION: Patient is a 76 y.o. female who was seen today for physical therapy  treatment for fecal incontinence.  Pelvic floor strength increased to 3/5 but not a very strong hug of the index finger and lift her pelvic floor instead of contracting her gluteals. Patient did need tactile cues to contract her pelvic floor. Patient has had in issue with fecal leakage when she takes the Miralax and the stool is watery.  Patient will benefit from skilled therapy to improve pelvic floor coordination, reduce pain and improve leakage.      OBJECTIVE IMPAIRMENTS decreased activity tolerance, decreased coordination, decreased endurance, decreased strength, increased fascial restrictions, increased muscle spasms, impaired tone, and pain.    ACTIVITY LIMITATIONS lifting, bending, continence, and toileting   PARTICIPATION LIMITATIONS: cleaning, interpersonal relationship, driving, and community activity   PERSONAL FACTORS Breast cancer 07/2012 with radiation 11/12/2012-12.12.2013, CAD (coronary artery disease); Chronic kidney disease stage 3; Fibromyalgia; Sjogren's disease ; Osteoporosis; TOTAL ABDOMINAL HYSTERECTOMY W/ BILATERAL  SALPINGOOPHORECTOMY 1982 are also affecting patient's functional outcome.    REHAB POTENTIAL: Excellent   CLINICAL DECISION MAKING: Evolving/moderate complexity   EVALUATION COMPLEXITY: Moderate     GOALS: Goals reviewed with patient? Yes   SHORT TERM GOALS: Target date: 07/10/2022   Patient educated on vaginal moisturizers and lubricant to improve vaginal health.  Baseline: Goal status: met 07/05/2022   2.  Patient independent with initial HEP for pelvic floor relaxation, contraction and abdominal work.  Baseline:  Goal status: met 07/28/2022   3.  Patient reports her abdominal pain is </= 25%.  Baseline:  Goal status: Met 07/12/2022    LONG TERM GOALS: Target date: 09/04/2022    Patient independent with advanced HEP for pelvic floor, core, and hip strength.  Baseline:  Goal status:ongoing 07/28/2022  2.  Patient reports her urinary leakage has improved >/= 75% due to improved strength and reduction of pain.  Baseline: no leakage Goal status: met 07/28/2022   3.  Patient reports her fecal leakage has improved >/= 75% due to increased in rectal strength.  Baseline: not fecal leakage since last visit.  Goal status: ongoing 07/28/2022   4.  Patient reports her abdominal pain has improved >/= 50%.  Baseline:  Goal status: ongoing 07/28/2022    5.  Patient is able to have penile penetration with 50% less pain due to improve vaginal moisture and reduction of trigger points in the pelvic floor.  Baseline:  Goal status: ongoing 07/28/2022     PLAN: PT FREQUENCY: 1x/week   PT DURATION: 12 weeks   PLANNED INTERVENTIONS: Therapeutic exercises, Therapeutic activity, Neuromuscular re-education, Patient/Family education, Joint mobilization, Electrical stimulation, Cryotherapy, Moist heat, Biofeedback, and Manual therapy   PLAN FOR NEXT SESSION:  manual work to abdomen and education on abdominal massage, work on abdominal  contraction and rectal contraction Earlie Counts, PT 08/16/22 9:31  AM

## 2022-08-17 DIAGNOSIS — R8271 Bacteriuria: Secondary | ICD-10-CM | POA: Diagnosis not present

## 2022-08-17 DIAGNOSIS — N3020 Other chronic cystitis without hematuria: Secondary | ICD-10-CM | POA: Diagnosis not present

## 2022-08-21 ENCOUNTER — Ambulatory Visit (INDEPENDENT_AMBULATORY_CARE_PROVIDER_SITE_OTHER): Payer: Medicare Other | Admitting: Family Medicine

## 2022-08-21 VITALS — BP 126/78 | HR 56 | Temp 97.8°F | Resp 16 | Ht 64.0 in | Wt 134.4 lb

## 2022-08-21 DIAGNOSIS — N61 Mastitis without abscess: Secondary | ICD-10-CM

## 2022-08-21 MED ORDER — DULOXETINE HCL 60 MG PO CPEP
60.0000 mg | ORAL_CAPSULE | Freq: Two times a day (BID) | ORAL | 1 refills | Status: DC
Start: 2022-08-21 — End: 2023-02-13

## 2022-08-21 MED ORDER — SUCRALFATE 1 GM/10ML PO SUSP: 1.0000 g | Freq: Three times a day (TID) | ORAL | 1 refills | Status: AC

## 2022-08-21 MED ORDER — CEPHALEXIN 500 MG PO CAPS: 500.0000 mg | ORAL_CAPSULE | Freq: Two times a day (BID) | ORAL | 0 refills | Status: DC

## 2022-08-21 NOTE — Patient Instructions (Signed)
Cellulitis, Adult  Cellulitis is a skin infection. The infected area is usually warm, red, swollen, and tender. This condition occurs most often in the arms and lower legs. The infection can travel to the muscles, blood, and underlying tissue and become serious. It is very important to get treated for this condition. What are the causes? Cellulitis is caused by bacteria. The bacteria enter through a break in the skin, such as a cut, burn, insect bite, open sore, or crack. What increases the risk? This condition is more likely to occur in people who: Have a weak body defense system (immune system). Have open wounds on the skin, such as cuts, burns, bites, and scrapes. Bacteria can enter the body through these open wounds. Are older than 77 years of age. Have diabetes. Have a type of long-lasting (chronic) liver disease (cirrhosis) or kidney disease. Are obese. Have a skin condition such as: Itchy rash (eczema). Slow movement of blood in the veins (venous stasis). Fluid buildup below the skin (edema). Have had radiation therapy. Use IV drugs. What are the signs or symptoms? Symptoms of this condition include: Redness, streaking, or spotting on the skin. Swollen area of the skin. Tenderness or pain when an area of the skin is touched. Warm skin. A fever. Chills. Blisters. How is this diagnosed? This condition is diagnosed based on a medical history and physical exam. You may also have tests, including: Blood tests. Imaging tests. How is this treated? Treatment for this condition may include: Medicines, such as antibiotic medicines or medicines to treat allergies (antihistamines). Supportive care, such as rest and application of cold or warm cloths (compresses) to the skin. Hospital care, if the condition is severe. The infection usually starts to get better within 1-2 days of treatment. Follow these instructions at home:  Medicines Take over-the-counter and prescription  medicines only as told by your health care provider. If you were prescribed an antibiotic medicine, take it as told by your health care provider. Do not stop taking the antibiotic even if you start to feel better. General instructions Drink enough fluid to keep your urine pale yellow. Do not touch or rub the infected area. Raise (elevate) the infected area above the level of your heart while you are sitting or lying down. Apply warm or cold compresses to the affected area as told by your health care provider. Keep all follow-up visits as told by your health care provider. This is important. These visits let your health care provider make sure a more serious infection is not developing. Contact a health care provider if: You have a fever. Your symptoms do not begin to improve within 1-2 days of starting treatment. Your bone or joint underneath the infected area becomes painful after the skin has healed. Your infection returns in the same area or another area. You notice a swollen bump in the infected area. You develop new symptoms. You have a general ill feeling (malaise) with muscle aches and pains. Get help right away if: Your symptoms get worse. You feel very sleepy. You develop vomiting or diarrhea that persists. You notice red streaks coming from the infected area. Your red area gets larger or turns dark in color. These symptoms may represent a serious problem that is an emergency. Do not wait to see if the symptoms will go away. Get medical help right away. Call your local emergency services (911 in the [place] Do not drive yourself to the hospital. Summary Cellulitis is a skin infection. This condition occurs most   often in the arms and lower legs. Treatment for this condition may include medicines, such as antibiotic medicines or antihistamines. Take over-the-counter and prescription medicines only as told by your health care provider. If you were prescribed an antibiotic medicine, do  not stop taking the antibiotic even if you start to feel better. Contact a health care provider if your symptoms do not begin to improve within 1-2 days of starting treatment or your symptoms get worse. Keep all follow-up visits as told by your health care provider. This is important. These visits let your health care provider make sure that a more serious infection is not developing. This information is not intended to replace advice given to you by your health care provider. Make sure you discuss any questions you have with your health care provider. Document Revised: 09/21/2021 Document Reviewed: 09/22/2021 Elsevier Patient Education  2023 [employer]  

## 2022-08-21 NOTE — Progress Notes (Unsigned)
Established Patient Office Visit  Subjective   Patient ID: [name], female    DOB: 1946-06-14  Age: 76 y.o. MRN: [id number]  Chief Complaint  Patient presents with   Cellulitis    R breast?, redness, painful     HPI Pt is here c/o redness and pain R breast x few days.  No other complaints  Patient Active Problem List   Diagnosis Date Noted   Tremor 07/05/2022   RUQ pain 07/05/2022   Leukocytosis 07/05/2022   Chronic renal insufficiency 03/21/2022   Sjogren's disease ([place]) 12/12/2021   Vocal cord cyst 05/18/2021   High serum vitamin D 05/18/2021   Dysuria 03/09/2021   Vitamin B12 deficiency 03/08/2021   Preventative health care 03/08/2021   Hyperglycemia 03/08/2021   Low back pain 09/08/2020   OSA and COPD overlap syndrome ([place]) 06/10/2020   Serotonin withdrawal syndrome without complication 17/00/1749   Recurrent falls 05/02/2020   Statin intolerance 02/29/2020   RLS (restless legs syndrome) 11/09/2019   Kidney stone 02/26/2019   Recurrent sinusitis 02/25/2019   Hx of colonic polyp 02/25/2019   DDD (degenerative disc disease), cervical 09/17/2018   Degenerative scoliosis 04/22/2018   Primary insomnia 06/25/2017   Chronic interstitial cystitis 02/01/2017   Chronic cough 01/09/2017   Right arm pain 07/20/2016   Deviated septum 05/12/2016   Nasal turbinate hypertrophy 05/12/2016   Dysphagia    Allergic rhinitis 01/25/2016   Other and combined forms of senile cataract 07/15/2015   Dyspnea    Left hip pain 04/30/2015   Sciatica 04/30/2015   Knee pain, right 04/30/2015   Bilateral carpal tunnel syndrome 03/04/2015   Hyperlipidemia 03/01/2015   Pulmonary nodules 02/12/2015   External hemorrhoids 02/05/2015   Chronic night sweats 01/08/2015   Atypical chest pain 01/01/2015   Renal insufficiency 07/16/2014   Memory loss 05/22/2014   Peripheral neuropathy 05/22/2014   Abdominal pain 10/26/2013   Headache 10/13/2013   Arthralgia 08/18/2013   ANA positive  07/29/2013   Depression 02/05/2013   Family history of malignant hyperthermia 02/05/2013   Malignant neoplasm of lower-outer quadrant of right breast of female, estrogen receptor positive ([place]) 01/03/2013   Splenic lesion 09/02/2012   Asthma, allergic 08/05/2012   GERD (gastroesophageal reflux disease) 06/03/2012   Edema 04/08/2012   Stricture and stenosis of esophagus 10/19/2011   Functional constipation 07/21/2011   Nonspecific (abnormal) findings on radiological and other examination of biliary tract 07/21/2011   Osteoporosis 04/17/2011   Leg pain 02/24/2011   FIBROCYSTIC BREAST DISEASE, HX OF 10/18/2010   Essential hypertension 08/25/2010   CAD in native artery 08/25/2010   TOBACCO ABUSE, HX OF 08/25/2010   GENERALIZED ANXIETY DISORDER 08/03/2010   Fibromyalgia 01/11/2010   Past Medical History:  Diagnosis Date   Allergy    Anemia    Anxiety    past hx    Arthritis    Asthma    Atrial flutter ([place])    past history- not current   Breast cancer ([place])    CAD (coronary artery disease) CARDIOLOGIST--  DR Angelena Form   mild non-obstructive cad   Cancer ([place])    right   Cataract    bilaterally removed    Chronic constipation    Chronic kidney disease    stage 3 ckd, interstitial cystitis   Concussion    x 3   COPD (chronic obstructive pulmonary disease) ([place])    Depression    past hx    Family history of malignant hyperthermia  father had this   Fibromyalgia    Frequency of urination    GERD (gastroesophageal reflux disease)    H/O hiatal hernia    History of basal cell carcinoma excision    X2   History of breast cancer ONCOLOGIST-- DR Jana Hakim---  NO RECURRANCE   DX 07/2012;  LOW GRADE DCIS  ER+PR+  ----  S/P RIGHT LUMPECTOMY WITH NEGATIVE MARGINS/   RADIATION ENDED 11/2012   History of chronic bronchitis    History of colonic polyps    History of tachycardia    CONTROLLED  WITH ATENOLOL   Hyperlipidemia    Hypertension    Neuromuscular disorder (HCC)     fibromyalgia   Neuropathy    Osteoporosis 01/2019   T score -2.2 stable/improved from prior study   Pelvic pain    Personal history of radiation therapy    S/P radiation therapy 11/12/12 - 12/05/12   right Breast   Sepsis ([place]) 2014   from UTI    Sinus headache    Sjogren's disease ([place])    Urgency of urination    Past Surgical History:  Procedure Laterality Date   32 HOUR [place] STUDY N/A 04/03/2016   Procedure: 24 HOUR [place] STUDY;  Surgeon: Manus Gunning, MD;  Location: [facility];  Service: Gastroenterology;  Laterality: N/A;   BREAST BIOPSY Right 08/23/2012   ADH   BREAST BIOPSY Right 10/11/2012   Ductal Carcinoma   BREAST EXCISIONAL BIOPSY Right 09/04/2013   benign   BREAST LUMPECTOMY Right 10-11-2012   W/ [facility] BX   CARDIAC CATHETERIZATION  09-13-2007  DR Lia Foyer   WELL-PRESERVED LVF/  DIFFUSE SCATTERED CORONARY CALCIFACATION AND ATHEROSCLEROSIS WITHOUT OBSTRUCTION   CARDIAC CATHETERIZATION  08-04-2010  DR MCALHANY   NON-OBSTRUCTIVE CAD/  pLAD 40%/  oLAD 30%/  mLAD 30%/  pRCA 30%/  EF 60%   CARDIOVASCULAR STRESS TEST  06-18-2012  DR McALHANY   LOW RISK NUCLEAR STUDY/  SMALL FIXED AREA OF MODERATELY DECREASED UPTAKE IN ANTEROSEPTAL WALL WHICH MAY BE ARTIFACTUAL/  NO ISCHEMIA/  EF 68%   COLONOSCOPY  09-29-2010   CYSTOSCOPY     CYSTOSCOPY WITH HYDRODISTENSION AND BIOPSY N/A 03/06/2014   Procedure: CYSTOSCOPY/HYDRODISTENSION/ INSTILATION OF MARCAINE AND PYRIDIUM;  Surgeon: Ailene Rud, MD;  Location: [facility];  Service: Urology;  Laterality: N/A;   ESOPHAGEAL MANOMETRY N/A 04/03/2016   Procedure: ESOPHAGEAL MANOMETRY (EM);  Surgeon: Manus Gunning, MD;  Location: [facility];  Service: Gastroenterology;  Laterality: N/A;   EXTRACORPOREAL SHOCK WAVE LITHOTRIPSY Left 02/06/2019   Procedure: EXTRACORPOREAL SHOCK WAVE LITHOTRIPSY (ESWL);  Surgeon: Kathie Rhodes, MD;  Location: [facility];  Service: Urology;  Laterality: Left;   NASAL SINUS SURGERY   1985   ORIF RIGHT ANKLE  FX  2006   POLYPECTOMY     REMOVAL VOCAL CORD CYST  FEB 2014   RIGHT BREAST BX  08-23-2012   RIGHT HAND SURGERY  X3  LAST ONE 2009   INCLUDES  ORIF RIGHT 5TH FINGER AND REVISION TWICE   SKIN CANCER EXCISION     TONSILLECTOMY AND ADENOIDECTOMY  AGE 7   TOTAL ABDOMINAL HYSTERECTOMY W/ BILATERAL SALPINGOOPHORECTOMY  1982   W/  APPENDECTOMY   TRANSTHORACIC ECHOCARDIOGRAM  06-24-2012   GRADE I DIASTOLIC DYSFUNCTION/  EF 55-60%/  MILD MR   UPPER GASTROINTESTINAL ENDOSCOPY     Social History   Tobacco Use   Smoking status: Former    Packs/day: 1.00    Years: 15.00  Total pack years: 15.00    Types: Cigarettes    Quit date: 05/27/2005    Years since quitting: 17.2   Smokeless tobacco: Never  Vaping Use   Vaping Use: Never used  Substance Use Topics   Alcohol use: No    Alcohol/week: 0.0 standard drinks of alcohol   Drug use: No   Social History   Socioeconomic History   Marital status: Married    Spouse name: Jori Moll    Number of children: 3   Years of education: BA   Highest education level: Not on file  Occupational History   Occupation: Retired Programmer, multimedia: RETIRED  Tobacco Use   Smoking status: Former    Packs/day: 1.00    Years: 15.00    Total pack years: 15.00    Types: Cigarettes    Quit date: 05/27/2005    Years since quitting: 17.2   Smokeless tobacco: Never  Vaping Use   Vaping Use: Never used  Substance and Sexual Activity   Alcohol use: No    Alcohol/week: 0.0 standard drinks of alcohol   Drug use: No   Sexual activity: Not Currently    Partners: Male    Birth control/protection: Surgical    Comment: 1st intercourse 76 yo-Fewer than 5 partners, hysterectomy  Other Topics Concern   Not on file  Social History Narrative   Lives with husband.   Caffeine use: 1/2 cup per day   Exercise-- 2days a week [employer],  Water aerobics, walking       Retired Marine scientist   No dietary restrictions, tries to maintain a heart healthy diet    Social Determinants of Health   Financial Resource Strain: Low Risk  (02/28/2022)   Overall Financial Resource Strain (CARDIA)    Difficulty of Paying Living Expenses: Not hard at all  Food Insecurity: No Food Insecurity (02/28/2022)   Hunger Vital Sign    Worried About Running Out of Food in the Last Year: Never true    Ran Out of Food in the Last Year: Never true  Transportation Needs: No Transportation Needs (02/28/2022)   PRAPARE - Hydrologist (Medical): No    Lack of Transportation (Non-Medical): No  Physical Activity: Not on file  Stress: Not on file  Social Connections: Socially Integrated (02/28/2022)   Social Connection and Isolation Panel [NHANES]    Frequency of Communication with Friends and Family: More than three times a week    Frequency of Social Gatherings with Friends and Family: More than three times a week    Attends Religious Services: More than 4 times per year    Active Member of Clubs or Organizations: Yes    Attends Archivist Meetings: More than 4 times per year    Marital Status: Married  Human resources officer Violence: Not At Risk (02/28/2022)   Humiliation, Afraid, Rape, and Kick questionnaire    Fear of Current or Ex-Partner: No    Emotionally Abused: No    Physically Abused: No    Sexually Abused: No   Family Status  Relation Name Status   Mother 88 Deceased   Mat Aunt  Deceased   Sister 67 Alive   Son 74 Alive   Daughter kristy,46 Alive   Father 75 Deceased   Field seismologist  (Not Specified)   Field seismologist  (Not Specified)   Annamarie Major  (Not Specified)   MGM  Deceased   MGF  Deceased   PGM  Deceased  PGF  Deceased   Daughter shelly,55 Alive   Cousin paternal,62 Deceased   Neg Hx  (Not Specified)   Family History  Problem Relation Age of Onset   Rectal cancer Mother    Colon cancer Mother    Pancreatic cancer Mother    Diabetes Mother    Breast cancer Mother 22   Breast cancer Maternal Aunt        breast   Birth  defects Maternal Aunt    Irritable bowel syndrome Son    Heart disease Son        CAD, MV replacement   Allergic Disorder Daughter    Diabetes Daughter    Colon cancer Father    Colon polyps Father    Diabetes Father    Stroke Father    Heart disease Father        CHF   Hyperlipidemia Father    Hypertension Father    Arthritis Father    Breast cancer Paternal Aunt    Breast cancer Paternal Aunt    Arthritis Paternal Uncle    Allergic Disorder Daughter    Heart disease Cousin        CAD, had a blood clot after stenting   Esophageal cancer Neg Hx    Stomach cancer Neg Hx    Allergies  Allergen Reactions   Clindamycin Rash and Shortness Of Breath   Clindamycin Hcl Shortness Of Breath and Rash   Penicillins Anaphylaxis   Rosuvastatin Anaphylaxis   Baclofen Other (See Comments) and Rash   Lincomycin Other (See Comments)   Prednisone Rash    Other reaction(s): Rash, Hives - can tolerate if she takes with benadryl    Clindamycin Hcl     Other reaction(s): Shortness of Breath, Rash   Codeine Hives and Other (See Comments)    headache Other reaction(s): Headache, Nausea, headache   Erythromycin Hives   Erythromycin Base Other (See Comments)    other   Fluzone [Influenza Virus Vaccine] Other (See Comments)    Local reaction at the site   Haemophilus Influenzae Other (See Comments)    Local reaction at the site Local reaction at the site   Haemophilus Influenzae Vaccines Other (See Comments)    Local reaction at site   Latex Hives   Levofloxacin     Other reaction(s): sick   Other Other (See Comments)    Local reaction at site   Pentazocine Other (See Comments)   Pentazocine Lactate Other (See Comments)    HALLUCINATION   Pneumococcal Vaccine Hives and Swelling    Other reaction(s): Hives, Shortness of Breath   Pneumococcal Vaccine Polyvalent Hives, Swelling and Other (See Comments)    REACTION: redness, swelling, and hives at injection site   Tamoxifen Nausea And  Vomiting and Other (See Comments)    HEADACHE Other reaction(s): Headache, Nausea      Review of Systems  Constitutional:  Negative for fever and malaise/fatigue.  HENT:  Negative for congestion.   Eyes:  Negative for blurred vision.  Respiratory:  Negative for shortness of breath.   Cardiovascular:  Negative for chest pain, palpitations and leg swelling.  Gastrointestinal:  Negative for abdominal pain, blood in stool and nausea.  Genitourinary:  Negative for dysuria and frequency.  Musculoskeletal:  Negative for falls.  Skin:  Positive for itching. Negative for rash.  Neurological:  Negative for dizziness, loss of consciousness and headaches.  Endo/Heme/Allergies:  Negative for environmental allergies.  Psychiatric/Behavioral:  Negative for depression. The patient is not  nervous/anxious.       Objective:     BP 126/78   Pulse (!) 56   Temp 97.8 F (36.6 C) (Oral)   Resp 16   Ht '5\' 4"'$  (1.626 m)   Wt 134 lb 6 oz (61 kg)   SpO2 96%   BMI 23.07 kg/m  BP Readings from Last 3 Encounters:  08/21/22 126/78  07/04/22 102/70  06/07/22 (!) 95/49   Wt Readings from Last 3 Encounters:  08/21/22 134 lb 6 oz (61 kg)  07/04/22 134 lb (60.8 kg)  06/07/22 130 lb 3.2 oz (59.1 kg)   SpO2 Readings from Last 3 Encounters:  08/21/22 96%  07/04/22 90%  03/22/22 100%      Physical Exam Vitals and nursing note reviewed.  Constitutional:      Appearance: Normal appearance.  Cardiovascular:     Rate and Rhythm: Normal rate and regular rhythm.  Pulmonary:     Effort: Pulmonary effort is normal.     Breath sounds: Normal breath sounds.  Chest:     Chest wall: No tenderness.  Breasts:    Right: Skin change and tenderness present. No swelling, bleeding, inverted nipple, mass or nipple discharge.     Left: No skin change or tenderness.       Comments: + hot to touch and errythema  Musculoskeletal:     Cervical back: Normal range of motion and neck supple.  Skin:    General:  Skin is warm.     Findings: Erythema and rash present.  Neurological:     Mental Status: She is alert.     No results found for any visits on 08/21/22.  Last CBC Lab Results  Component Value Date   WBC 9.9 07/04/2022   HGB 11.6 (L) 07/04/2022   HCT 36.5 07/04/2022   MCV 89.2 07/04/2022   MCH 30.0 09/10/2017   RDW 16.5 (H) 07/04/2022   PLT 363.0 18/29/9371   Last metabolic panel Lab Results  Component Value Date   GLUCOSE 98 06/07/2022   NA 138 06/07/2022   K 5.2 No hemolysis seen (H) 06/07/2022   CL 103 06/07/2022   CO2 29 06/07/2022   BUN 13 06/07/2022   CREATININE 1.10 06/07/2022   GFRNONAA 57 (L) 09/03/2018   CALCIUM 9.6 06/07/2022   PROT 6.7 06/07/2022   ALBUMIN 4.0 06/07/2022   LABGLOB 3.1 09/10/2017   LABGLOB 2.4 09/10/2017   AGRATIO 1.8 09/10/2017   BILITOT 0.4 06/07/2022   ALKPHOS 159 (H) 06/07/2022   ALKPHOS 177 (H) 06/07/2022   AST 14 06/07/2022   ALT 7 06/07/2022   ANIONGAP 12 (H) 04/20/2015   Last lipids Lab Results  Component Value Date   CHOL 144 03/21/2022   HDL 47.50 03/21/2022   LDLCALC 79 03/21/2022   LDLDIRECT 117.3 04/08/2012   TRIG 88.0 03/21/2022   CHOLHDL 3 03/21/2022   Last hemoglobin A1c Lab Results  Component Value Date   HGBA1C 5.8 03/21/2022   Last thyroid functions Lab Results  Component Value Date   TSH 3.22 03/21/2022   T4TOTAL 6.6 09/10/2017   Last vitamin D Lab Results  Component Value Date   VD25OH 32.14 03/21/2022   Last vitamin B12 and Folate Lab Results  Component Value Date   IRCVELFY10 175 03/21/2022   FOLATE 7.0 09/20/2017      The 10-year ASCVD risk score (Arnett DK, et al., 2019) is: 42.1%    Assessment & Plan:   Problem List Items Addressed This Visit  None Visit Diagnoses     Cellulitis of right breast    -  Primary   Relevant Medications   cephALEXin (KEFLEX) 500 MG capsule   Other Relevant Orders   US BREAST LTD UNI RIGHT INC AXILLA   MM DIAG BREAST TOMO BILATERAL        Return if symptoms worsen or fail to improve.    Ann Held, DO

## 2022-08-22 ENCOUNTER — Other Ambulatory Visit: Payer: Self-pay | Admitting: Family Medicine

## 2022-08-22 ENCOUNTER — Encounter: Payer: Self-pay | Admitting: Family Medicine

## 2022-08-22 DIAGNOSIS — D631 Anemia in chronic kidney disease: Secondary | ICD-10-CM | POA: Diagnosis not present

## 2022-08-22 DIAGNOSIS — N2581 Secondary hyperparathyroidism of renal origin: Secondary | ICD-10-CM | POA: Diagnosis not present

## 2022-08-22 DIAGNOSIS — N61 Mastitis without abscess: Secondary | ICD-10-CM

## 2022-08-22 DIAGNOSIS — N183 Chronic kidney disease, stage 3 unspecified: Secondary | ICD-10-CM | POA: Diagnosis not present

## 2022-08-22 DIAGNOSIS — I129 Hypertensive chronic kidney disease with stage 1 through stage 4 chronic kidney disease, or unspecified chronic kidney disease: Secondary | ICD-10-CM | POA: Diagnosis not present

## 2022-08-22 NOTE — Telephone Encounter (Signed)
error 

## 2022-08-23 ENCOUNTER — Ambulatory Visit: Payer: Medicare Other | Admitting: Physical Therapy

## 2022-08-23 ENCOUNTER — Encounter: Payer: Self-pay | Admitting: Physical Therapy

## 2022-08-23 DIAGNOSIS — R278 Other lack of coordination: Secondary | ICD-10-CM | POA: Diagnosis not present

## 2022-08-23 DIAGNOSIS — R1084 Generalized abdominal pain: Secondary | ICD-10-CM | POA: Diagnosis not present

## 2022-08-23 DIAGNOSIS — M6281 Muscle weakness (generalized): Secondary | ICD-10-CM | POA: Diagnosis not present

## 2022-08-23 NOTE — Therapy (Signed)
OUTPATIENT PHYSICAL THERAPY TREATMENT NOTE   Patient Name: [name] MRN: [id number] DOB:1946/08/31, 76 y.o., female Today's Date: 08/23/2022  PCP: Mosie Lukes, MD REFERRING PROVIDER: Ileana Roup, MD  END OF SESSION:   PT End of Session - 08/23/22 0839     Visit Number 7    Date for PT Re-Evaluation 09/04/22    Authorization Type [employer] medicare    Authorization - Visit Number 7    Authorization - Number of Visits 10    PT Start Time 0845    PT Stop Time 0925    PT Time Calculation (min) 40 min    Activity Tolerance Patient tolerated treatment well    Behavior During Therapy WFL for tasks assessed/performed             Past Medical History:  Diagnosis Date   Allergy    Anemia    Anxiety    past hx    Arthritis    Asthma    Atrial flutter ([place])    past history- not current   Breast cancer ([place])    CAD (coronary artery disease) CARDIOLOGIST--  DR Angelena Form   mild non-obstructive cad   Cancer ([place])    right   Cataract    bilaterally removed    Chronic constipation    Chronic kidney disease    stage 3 ckd, interstitial cystitis   Concussion    x 3   COPD (chronic obstructive pulmonary disease) ([place])    Depression    past hx    Family history of malignant hyperthermia    father had this   Fibromyalgia    Frequency of urination    GERD (gastroesophageal reflux disease)    H/O hiatal hernia    History of basal cell carcinoma excision    X2   History of breast cancer ONCOLOGIST-- DR Jana Hakim---  NO RECURRANCE   DX 07/2012;  LOW GRADE DCIS  ER+PR+  ----  S/P RIGHT LUMPECTOMY WITH NEGATIVE MARGINS/   RADIATION ENDED 11/2012   History of chronic bronchitis    History of colonic polyps    History of tachycardia    CONTROLLED  WITH ATENOLOL   Hyperlipidemia    Hypertension    Neuromuscular disorder ([place])    fibromyalgia   Neuropathy    Osteoporosis 01/2019   T score -2.2 stable/improved from prior study   Pelvic pain    Personal history of  radiation therapy    S/P radiation therapy 11/12/12 - 12/05/12   right Breast   Sepsis ([place]) 2014   from UTI    Sinus headache    Sjogren's disease ([place])    Urgency of urination    Past Surgical History:  Procedure Laterality Date   35 HOUR [place] STUDY N/A 04/03/2016   Procedure: 24 HOUR [place] STUDY;  Surgeon: Manus Gunning, MD;  Location: [facility];  Service: Gastroenterology;  Laterality: N/A;   BREAST BIOPSY Right 08/23/2012   ADH   BREAST BIOPSY Right 10/11/2012   Ductal Carcinoma   BREAST EXCISIONAL BIOPSY Right 09/04/2013   benign   BREAST LUMPECTOMY Right 10-11-2012   W/ SLN BX   CARDIAC CATHETERIZATION  09-13-2007  DR Lia Foyer   WELL-PRESERVED LVF/  DIFFUSE SCATTERED CORONARY CALCIFACATION AND ATHEROSCLEROSIS WITHOUT OBSTRUCTION   CARDIAC CATHETERIZATION  08-04-2010  DR MCALHANY   NON-OBSTRUCTIVE CAD/  pLAD 40%/  oLAD 30%/  mLAD 30%/  pRCA 30%/  EF 60%   CARDIOVASCULAR STRESS TEST  06-18-2012  DR  McALHANY   LOW RISK NUCLEAR STUDY/  SMALL FIXED AREA OF MODERATELY DECREASED UPTAKE IN ANTEROSEPTAL WALL WHICH MAY BE ARTIFACTUAL/  NO ISCHEMIA/  EF 68%   COLONOSCOPY  09-29-2010   CYSTOSCOPY     CYSTOSCOPY WITH HYDRODISTENSION AND BIOPSY N/A 03/06/2014   Procedure: CYSTOSCOPY/HYDRODISTENSION/ INSTILATION OF MARCAINE AND PYRIDIUM;  Surgeon: Ailene Rud, MD;  Location: [facility];  Service: Urology;  Laterality: N/A;   ESOPHAGEAL MANOMETRY N/A 04/03/2016   Procedure: ESOPHAGEAL MANOMETRY (EM);  Surgeon: Manus Gunning, MD;  Location: [facility];  Service: Gastroenterology;  Laterality: N/A;   EXTRACORPOREAL SHOCK WAVE LITHOTRIPSY Left 02/06/2019   Procedure: EXTRACORPOREAL SHOCK WAVE LITHOTRIPSY (ESWL);  Surgeon: Kathie Rhodes, MD;  Location: [facility];  Service: Urology;  Laterality: Left;   NASAL SINUS SURGERY  1985   ORIF RIGHT ANKLE  FX  2006   POLYPECTOMY     REMOVAL VOCAL CORD CYST  FEB 2014   RIGHT BREAST BX  08-23-2012   RIGHT HAND  SURGERY  X3  LAST ONE 2009   INCLUDES  ORIF RIGHT 5TH FINGER AND REVISION TWICE   SKIN CANCER EXCISION     TONSILLECTOMY AND ADENOIDECTOMY  AGE 32   TOTAL ABDOMINAL HYSTERECTOMY W/ BILATERAL SALPINGOOPHORECTOMY  1982   W/  APPENDECTOMY   TRANSTHORACIC ECHOCARDIOGRAM  06-24-2012   GRADE I DIASTOLIC DYSFUNCTION/  EF 55-60%/  MILD MR   UPPER GASTROINTESTINAL ENDOSCOPY     Patient Active Problem List   Diagnosis Date Noted   Tremor 07/05/2022   RUQ pain 07/05/2022   Leukocytosis 07/05/2022   Chronic renal insufficiency 03/21/2022   Sjogren's disease ([place]) 12/12/2021   Vocal cord cyst 05/18/2021   High serum vitamin D 05/18/2021   Dysuria 03/09/2021   Vitamin B12 deficiency 03/08/2021   Preventative health care 03/08/2021   Hyperglycemia 03/08/2021   Low back pain 09/08/2020   OSA and COPD overlap syndrome ([place]) 06/10/2020   Serotonin withdrawal syndrome without complication 33/54/5625   Recurrent falls 05/02/2020   Statin intolerance 02/29/2020   RLS (restless legs syndrome) 11/09/2019   Kidney stone 02/26/2019   Recurrent sinusitis 02/25/2019   Hx of colonic polyp 02/25/2019   DDD (degenerative disc disease), cervical 09/17/2018   Degenerative scoliosis 04/22/2018   Primary insomnia 06/25/2017   Chronic interstitial cystitis 02/01/2017   Chronic cough 01/09/2017   Right arm pain 07/20/2016   Deviated septum 05/12/2016   Nasal turbinate hypertrophy 05/12/2016   Dysphagia    Allergic rhinitis 01/25/2016   Other and combined forms of senile cataract 07/15/2015   Dyspnea    Left hip pain 04/30/2015   Sciatica 04/30/2015   Knee pain, right 04/30/2015   Bilateral carpal tunnel syndrome 03/04/2015   Hyperlipidemia 03/01/2015   Pulmonary nodules 02/12/2015   External hemorrhoids 02/05/2015   Chronic night sweats 01/08/2015   Atypical chest pain 01/01/2015   Renal insufficiency 07/16/2014   Memory loss 05/22/2014   Peripheral neuropathy 05/22/2014   Abdominal pain  10/26/2013   Headache 10/13/2013   Arthralgia 08/18/2013   ANA positive 07/29/2013   Depression 02/05/2013   Family history of malignant hyperthermia 02/05/2013   Malignant neoplasm of lower-outer quadrant of right breast of female, estrogen receptor positive ([place]) 01/03/2013   Splenic lesion 09/02/2012   Asthma, allergic 08/05/2012   GERD (gastroesophageal reflux disease) 06/03/2012   Edema 04/08/2012   Stricture and stenosis of esophagus 10/19/2011   Functional constipation 07/21/2011   Nonspecific (abnormal) findings on radiological and other examination of  biliary tract 07/21/2011   Osteoporosis 04/17/2011   Leg pain 02/24/2011   FIBROCYSTIC BREAST DISEASE, HX OF 10/18/2010   Essential hypertension 08/25/2010   CAD in native artery 08/25/2010   TOBACCO ABUSE, HX OF 08/25/2010   GENERALIZED ANXIETY DISORDER 08/03/2010   Fibromyalgia 01/11/2010   REFERRING DIAG: R15.9 (ICD-10-CM) - Full incontinence of feces   THERAPY DIAG:  Muscle weakness (generalized)   Other lack of coordination   Generalized abdominal pain   Rationale for Evaluation and Treatment Rehabilitation   ONSET DATE: 2019   SUBJECTIVE:                                                                                                                                                                                            SUBJECTIVE STATEMENT:  I had some stool leakage the past 2 days. I have had trouble doing my exercises due to sinuses and I have cellulitis in my breast where I had cancer. I do not feel good to do anything right now. My abdomen feels better. If I am full with poop my back hurts. Patient is not straining to have a bowel movement.  PAIN:  Are you having pain? Yes NPRS scale: 5/10 Pain location:  abdomen   Pain type: stabbing, hot Pain description: constant    Aggravating factors: when stomach is bloated, touching the abdomen Relieving factors: when completely empty from stool,  PAIN:  Are  you having pain? Yes: NPRS scale: 7/10 Pain location: low back Pain description: intermittent Aggravating factors: movement ,standing up, supine to sitting Relieving factors: laying down     PRECAUTIONS: Other: Breast cancer with radiation   WEIGHT BEARING RESTRICTIONS No   FALLS:  Has patient fallen in last 6 months? No, cane is helping   LIVING ENVIRONMENT: Lives with: lives with their family   OCCUPATION: retired   PLOF: Independent   PATIENT GOALS reduce leakage and abdominal pain, increase strength of anal sphincter   PERTINENT HISTORY:  Breast cancer 07/2012 with radiation 11/12/2012-12.12.2013, CAD (coronary artery disease); Chronic kidney disease stage 3; Fibromyalgia; Sjogren's disease ; Osteoporosis; TOTAL ABDOMINAL HYSTERECTOMY W/ BILATERAL SALPINGOOPHORECTOMY 1982     BOWEL MOVEMENT Pain with bowel movement: Yes, due to hemorrhoids that bleed and are painful Type of bowel movement:Type (Bristol Stool Scale) Type 1, 4, 5, 7, Frequency 1-5 days, and Strain no by holding abdomen and leaning forward.  Fully empty rectum: No Leakage: Yes: 1 accident per day , gets fecal urgency Pads: None Fiber supplement: Yes: miralax   URINATION Pain with urination: Yes, due to the UTI Fully empty bladder: No, push pushes the suprapubic area  and get 1/4 of a teacup Stream: Strong and Weak Urgency: yes when she is out  Frequency: urinate every 2-3 hours Leakage: Urge to void, Walking to the bathroom, Coughing, Sneezing, Laughing, Lifting, and Bending forward Pads: Yes: 1 depends per day   INTERCOURSE Pain with intercourse: Initial Penetration, uses moisturizers around the vaginal canal Ability to have vaginal penetration:  No Climax: has not had a climax for a long time Marinoff Scale: 3/3   PREGNANCY Vaginal deliveries 3 Tearing episiotomy 3 times   PROLAPSE None       OBJECTIVE:      PATIENT SURVEYS:  PFIQ-7 171, UIQ-7 57, CRAIQ-7 52, POPIQ-7 62   COGNITION:             Overall cognitive status: Within functional limits for tasks assessed                              GAIT: Distance walked: walks with a cane slowly Assistive device utilized: Single point cane Level of assistance: Complete Independence                POSTURE: decreased lumbar lordosis, scoliosis, left ilium higher than right               PELVIC ALIGNMENT:   LUMBARAROM/PROM   A/PROM A/PROM  eval  Flexion Decreased by 50%  Extension Decreased by 50%  Right lateral flexion Decreased by 50%  Left lateral flexion Decreased by 50%  Right rotation Decreased by 50%  Left rotation Decreased by 50%   (Blank rows = not tested)   LOWER EXTREMITY ROM: bilateral hip ROM is full.      LOWER EXTREMITY MMT:   MMT Right eval Left eval Right 08/09/2022 Left 08/09/2022  Hip flexion 4/5 4/5 4/5 4/5  Hip extension 4/5 4/5 4/5 4/5  Hip abduction 4/5 4/5 4/5 4/5     PALPATION:   General  tenderness throughout the abdomen                 External Perineal Exam labia minora is fused to the labia majora,                              Internal Pelvic Floor redness located on the bottom half of the urethra, tenderness located on levator ani and obturator internist, along the right side of the introitus.    Patient confirms identification and approves PT to assess internal pelvic floor and treatment Yes   PELVIC MMT:   MMT eval 08/16/2022 08/23/2022  Vaginal 3/5     Internal Anal Sphincter 2/5 3/5 3/5  External Anal Sphincter 2/5 3/5 3/5  Puborectalis 2/5 3/5 3/5  (Blank rows = not tested) No emotional/communication barriers or cognitive limitation. Patient is motivated to learn. Patient understands and agrees with treatment goals and plan. PT explains patient will be examined in standing, sitting, and lying down to see how their muscles and joints work. When they are ready, they will be asked to remove their underwear so PT can examine their perineum. The patient is also given the  option of providing their own chaperone as one is not provided in our facility. The patient also has the right and is explained the right to defer or refuse any part of the evaluation or treatment including the internal exam. With the patient's consent, PT will use one gloved finger to gently  assess the muscles of the pelvic floor, seeing how well it contracts and relaxes and if there is muscle symmetry. After, the patient will get dressed and PT and patient will discuss exam findings and plan of care. PT and patient discuss plan of care, schedule, attendance policy and HEP activities.         TONE: Increased tone vaginally   PROLAPSE: none   TODAY'S TREATMENT  08/23/2022 Manual: Internal pelvic floor techniques:No emotional/communication barriers or cognitive limitation. Patient is motivated to learn. Patient understands and agrees with treatment goals and plan. PT explains patient will be examined in standing, sitting, and lying down to see how their muscles and joints work. When they are ready, they will be asked to remove their underwear so PT can examine their perineum. The patient is also given the option of providing their own chaperone as one is not provided in our facility. The patient also has the right and is explained the right to defer or refuse any part of the evaluation or treatment including the internal exam. With the patient's consent, PT will use one gloved finger to gently assess the muscles of the pelvic floor, seeing how well it contracts and relaxes and if there is muscle symmetry. After, the patient will get dressed and PT and patient will discuss exam findings and plan of care. PT and patient discuss plan of care, schedule, attendance policy and HEP activities.    Going through the anus working on the left internal sphincter and anterior wall of the rectum, manual work on the puborectalis to improve the forward motion   Tactile cues to contract the anus in a circular motion and  lift   Manual work to the perineal body internally and externally to improve the anterior motion of the rectum Neuromuscular re-education: Core retraining:hip flexion isometric holding 5 seconds with pelvic floor contraction 5x each side Hip adduction with ball squeeze holding 5 sec with anal contraction 10x Sitting anal contraction holding for 5 sec 10x Sit to stand with pelvic floor contraction 5x without holding on Pelvic floor contraction training:tactile cues to the rectum to improve contraction    08/16/2022 Manual: Internal pelvic floor techniques:No emotional/communication barriers or cognitive limitation. Patient is motivated to learn. Patient understands and agrees with treatment goals and plan. PT explains patient will be examined in standing, sitting, and lying down to see how their muscles and joints work. When they are ready, they will be asked to remove their underwear so PT can examine their perineum. The patient is also given the option of providing their own chaperone as one is not provided in our facility. The patient also has the right and is explained the right to defer or refuse any part of the evaluation or treatment including the internal exam. With the patient's consent, PT will use one gloved finger to gently assess the muscles of the pelvic floor, seeing how well it contracts and relaxes and if there is muscle symmetry. After, the patient will get dressed and PT and patient will discuss exam findings and plan of care. PT and patient discuss plan of care, schedule, attendance policy and HEP activities.                                   Going through the vagina performing manual work to the perineal body, along the anococcygeal ligament, along the anal sphincter and levator ani   Neuromuscular re-education:  Pelvic floor contraction training:tactile cues to the anus to contract with a lift and not contract the gluteal. Holding 10 sec and fully relaxing then working on quick  flicks.      08/09/2022 Manual: Soft tissue mobilization:Manual work along the suprapubic bone to release the thick tissue, manual work along the umbilicus and obliques.  Scar tissue mobilization:to lower abdomen with pulling up on the scar, moving the tissue, and going through the restrictions.  Myofascial release:Tissue rolling of the abdomen, Fascial release along the mesenteric root, along the upper abdomen, along the sides of the abdomen; release of the bladder and pubovesical ligament, release of the fascial along the midline of the abdomen.       07/28/2022 Manual: Scar massage to lower abdomen with pulling up on the scar, moving the tissue, and going through the restrictions.  Manual work along the suprapubic bone to release the thick tissue Tissue rolling of the abdomen Using the suction cup on the scar and throughout the abdomen pulling the skin off the muscle Fascial release along the mesenteric root, along the upper abdomen, along the sides of the abdomen Manual work to the diaphragm   PATIENT EDUCATION:  08/16/2022 Education details:  Access Code: [email or web address] Person educated: Patient Education method: Explanation, Demonstration, Tactile cues, Verbal cues, and Handouts Education comprehension: verbalized understanding, returned demonstration, verbal cues required, tactile cues required, and needs further education     HOME EXERCISE PROGRAM: 08/16/2022  Access Code: [facility] URL: [email or web address] Date: 08/16/2022 Prepared by: Earlie Counts   Exercises - Hooklying Isometric Hip Flexion  - 1 x daily - 7 x weekly - 5 sets - 10 reps - 5 sec hold - Supine Hip Adduction Isometric with Ball  - 1 x daily - 7 x weekly - 1 sets - 10 reps - Seated Pelvic Floor Contraction  - 3 x daily - 7 x weekly - 1 sets - 10 reps - 10 sec hold - Seated Quick Flick Pelvic Floor Contractions  - 3 x daily - 7 x weekly - 1 sets - 5 reps ASSESSMENT:   CLINICAL IMPRESSION: Patient is a  75 y.o. female who was seen today for physical therapy  treatment for fecal incontinence. I am not straining to have a bowel movement. Abdominal pain is 7/10 instead of 9/10. Patient is feel lethargic right now.   Patient reports her abdominal pain has decreased by 50%. Patient is able to feel her anal contraction to be tighter and she was able to hug the therapist finger better. Patient had some restrictions in the perineal body that were released. Patient will benefit from skilled therapy to improve pelvic floor coordination, reduce pain and improve leakage.      OBJECTIVE IMPAIRMENTS decreased activity tolerance, decreased coordination, decreased endurance, decreased strength, increased fascial restrictions, increased muscle spasms, impaired tone, and pain.    ACTIVITY LIMITATIONS lifting, bending, continence, and toileting   PARTICIPATION LIMITATIONS: cleaning, interpersonal relationship, driving, and community activity   PERSONAL FACTORS Breast cancer 07/2012 with radiation 11/12/2012-12.12.2013, CAD (coronary artery disease); Chronic kidney disease stage 3; Fibromyalgia; Sjogren's disease ; Osteoporosis; TOTAL ABDOMINAL HYSTERECTOMY W/ BILATERAL SALPINGOOPHORECTOMY 1982 are also affecting patient's functional outcome.    REHAB POTENTIAL: Excellent   CLINICAL DECISION MAKING: Evolving/moderate complexity   EVALUATION COMPLEXITY: Moderate     GOALS: Goals reviewed with patient? Yes   SHORT TERM GOALS: Target date: 07/10/2022   Patient educated on vaginal moisturizers and lubricant to improve vaginal health.  Baseline: Goal status: met 07/05/2022  2.  Patient independent with initial HEP for pelvic floor relaxation, contraction and abdominal work.  Baseline:  Goal status: met 07/28/2022   3.  Patient reports her abdominal pain is </= 25%.  Baseline:  Goal status: Met 07/12/2022    LONG TERM GOALS: Target date: 09/04/2022    Patient independent with advanced HEP for pelvic floor,  core, and hip strength.  Baseline:  Goal status:ongoing 07/28/2022  2.  Patient reports her urinary leakage has improved >/= 75% due to improved strength and reduction of pain.  Baseline: no leakage Goal status: met 07/28/2022   3.  Patient reports her fecal leakage has improved >/= 75% due to increased in rectal strength.  Baseline: not fecal leakage since last visit.  Goal status: ongoing 07/28/2022   4.  Patient reports her abdominal pain has improved >/= 50%.  Baseline:  Goal status: met 08/24/3031   5.  Patient is able to have penile penetration with 50% less pain due to improve vaginal moisture and reduction of trigger points in the pelvic floor.  Baseline:  Goal status: ongoing 07/28/2022     PLAN: PT FREQUENCY: 1x/week   PT DURATION: 12 weeks   PLANNED INTERVENTIONS: Therapeutic exercises, Therapeutic activity, Neuromuscular re-education, Patient/Family education, Joint mobilization, Electrical stimulation, Cryotherapy, Moist heat, Biofeedback, and Manual therapy   PLAN FOR NEXT SESSION:  manual work to abdomen and education on abdominal massage, work on abdominal contraction and rectal contraction; progress exercises in prone  Earlie Counts, PT 08/23/22 9:24 AM

## 2022-08-25 ENCOUNTER — Ambulatory Visit: Payer: Medicare Other

## 2022-08-25 ENCOUNTER — Ambulatory Visit
Admission: RE | Admit: 2022-08-25 | Discharge: 2022-08-25 | Disposition: A | Discharge status: Home / Self Care | Payer: Medicare Other | Source: Ambulatory Visit | Attending: Family Medicine | Admitting: Family Medicine

## 2022-08-25 DIAGNOSIS — N61 Mastitis without abscess: Secondary | ICD-10-CM

## 2022-08-25 DIAGNOSIS — R922 Inconclusive mammogram: Secondary | ICD-10-CM | POA: Diagnosis not present

## 2022-08-30 ENCOUNTER — Encounter: Payer: Self-pay | Admitting: Physical Therapy

## 2022-08-30 ENCOUNTER — Ambulatory Visit: Payer: Medicare Other | Attending: Surgery | Admitting: Physical Therapy

## 2022-08-30 DIAGNOSIS — R1084 Generalized abdominal pain: Secondary | ICD-10-CM | POA: Insufficient documentation

## 2022-08-30 DIAGNOSIS — R278 Other lack of coordination: Secondary | ICD-10-CM | POA: Insufficient documentation

## 2022-08-30 DIAGNOSIS — M6281 Muscle weakness (generalized): Secondary | ICD-10-CM | POA: Insufficient documentation

## 2022-08-30 NOTE — Therapy (Signed)
OUTPATIENT PHYSICAL THERAPY TREATMENT NOTE   Patient Name: [name] MRN: [id number] DOB:[birth year], 76 y.o., female Today's Date: 08/30/2022  PCP: Bradd Canary, MD REFERRING PROVIDER: Andria Meuse, MD  END OF SESSION:   PT End of Session - 08/30/22 1153     Visit Number 8    Date for PT Re-Evaluation 11/27/22    Authorization Type [employer] medicare    Authorization - Visit Number 8    Authorization - Number of Visits 10    PT Start Time 1154   came late due to getting lost   PT Stop Time 1225    PT Time Calculation (min) 31 min    Activity Tolerance Patient tolerated treatment well    Behavior During Therapy WFL for tasks assessed/performed             Past Medical History:  Diagnosis Date   Allergy    Anemia    Anxiety    past hx    Arthritis    Asthma    Atrial flutter (HCC)    past history- not current   Breast cancer (HCC)    CAD (coronary artery disease) CARDIOLOGIST--  DR Clifton James   mild non-obstructive cad   Cancer (HCC)    right   Cataract    bilaterally removed    Chronic constipation    Chronic kidney disease    stage 3 ckd, interstitial cystitis   Concussion    x 3   COPD (chronic obstructive pulmonary disease) (HCC)    Depression    past hx    Family history of malignant hyperthermia    father had this   Fibromyalgia    Frequency of urination    GERD (gastroesophageal reflux disease)    H/O hiatal hernia    History of basal cell carcinoma excision    X2   History of breast cancer ONCOLOGIST-- DR Darnelle Catalan---  NO RECURRANCE   DX 07/2012;  LOW GRADE DCIS  ER+PR+  ----  S/P RIGHT LUMPECTOMY WITH NEGATIVE MARGINS/   RADIATION ENDED 11/2012   History of chronic bronchitis    History of colonic polyps    History of tachycardia    CONTROLLED  WITH ATENOLOL   Hyperlipidemia    Hypertension    Neuromuscular disorder (HCC)    fibromyalgia   Neuropathy    Osteoporosis 01/2019   T score -2.2 stable/improved from prior study   Pelvic  pain    Personal history of radiation therapy    S/P radiation therapy 11/12/12 - 12/05/12   right Breast   Sepsis (HCC) 2014   from UTI    Sinus headache    Sjogren's disease (HCC)    Urgency of urination    Past Surgical History:  Procedure Laterality Date   1 HOUR PH STUDY N/A 04/03/2016   Procedure: 24 HOUR PH STUDY;  Surgeon: Ruffin Frederick, MD;  Location: [facility];  Service: Gastroenterology;  Laterality: N/A;   BREAST BIOPSY Right 08/23/2012   ADH   BREAST BIOPSY Right 10/11/2012   Ductal Carcinoma   BREAST EXCISIONAL BIOPSY Right 09/04/2013   benign   BREAST LUMPECTOMY Right 10-11-2012   W/ [facility] BX   CARDIAC CATHETERIZATION  09-13-2007  DR Riley Kill   WELL-PRESERVED LVF/  DIFFUSE SCATTERED CORONARY CALCIFACATION AND ATHEROSCLEROSIS WITHOUT OBSTRUCTION   CARDIAC CATHETERIZATION  08-04-2010  DR Clifton James   NON-OBSTRUCTIVE CAD/  pLAD 40%/  oLAD 30%/  mLAD 30%/  pRCA 30%/  EF 60%  CARDIOVASCULAR STRESS TEST  06-18-2012  DR McALHANY   LOW RISK NUCLEAR STUDY/  SMALL FIXED AREA OF MODERATELY DECREASED UPTAKE IN ANTEROSEPTAL WALL WHICH MAY BE ARTIFACTUAL/  NO ISCHEMIA/  EF 68%   COLONOSCOPY  09-29-2010   CYSTOSCOPY     CYSTOSCOPY WITH HYDRODISTENSION AND BIOPSY N/A 03/06/2014   Procedure: CYSTOSCOPY/HYDRODISTENSION/ INSTILATION OF MARCAINE AND PYRIDIUM;  Surgeon: Ailene Rud, MD;  Location: [facility];  Service: Urology;  Laterality: N/A;   ESOPHAGEAL MANOMETRY N/A 04/03/2016   Procedure: ESOPHAGEAL MANOMETRY (EM);  Surgeon: Manus Gunning, MD;  Location: [facility];  Service: Gastroenterology;  Laterality: N/A;   EXTRACORPOREAL SHOCK WAVE LITHOTRIPSY Left 02/06/2019   Procedure: EXTRACORPOREAL SHOCK WAVE LITHOTRIPSY (ESWL);  Surgeon: Kathie Rhodes, MD;  Location: [facility];  Service: Urology;  Laterality: Left;   NASAL SINUS SURGERY  1985   ORIF RIGHT ANKLE  FX  2006   POLYPECTOMY     REMOVAL VOCAL CORD CYST  FEB 2014   RIGHT BREAST BX   08-23-2012   RIGHT HAND SURGERY  X3  LAST ONE 2009   INCLUDES  ORIF RIGHT 5TH FINGER AND REVISION TWICE   SKIN CANCER EXCISION     TONSILLECTOMY AND ADENOIDECTOMY  AGE 43   TOTAL ABDOMINAL HYSTERECTOMY W/ BILATERAL SALPINGOOPHORECTOMY  1982   W/  APPENDECTOMY   TRANSTHORACIC ECHOCARDIOGRAM  06-24-2012   GRADE I DIASTOLIC DYSFUNCTION/  EF 55-60%/  MILD MR   UPPER GASTROINTESTINAL ENDOSCOPY     Patient Active Problem List   Diagnosis Date Noted   Tremor 07/05/2022   RUQ pain 07/05/2022   Leukocytosis 07/05/2022   Chronic renal insufficiency 03/21/2022   Sjogren's disease ([place]) 12/12/2021   Vocal cord cyst 05/18/2021   High serum vitamin D 05/18/2021   Dysuria 03/09/2021   Vitamin B12 deficiency 03/08/2021   Preventative health care 03/08/2021   Hyperglycemia 03/08/2021   Low back pain 09/08/2020   OSA and COPD overlap syndrome ([place]) 06/10/2020   Serotonin withdrawal syndrome without complication 62/22/9798   Recurrent falls 05/02/2020   Statin intolerance 02/29/2020   RLS (restless legs syndrome) 11/09/2019   Kidney stone 02/26/2019   Recurrent sinusitis 02/25/2019   Hx of colonic polyp 02/25/2019   DDD (degenerative disc disease), cervical 09/17/2018   Degenerative scoliosis 04/22/2018   Primary insomnia 06/25/2017   Chronic interstitial cystitis 02/01/2017   Chronic cough 01/09/2017   Right arm pain 07/20/2016   Deviated septum 05/12/2016   Nasal turbinate hypertrophy 05/12/2016   Dysphagia    Allergic rhinitis 01/25/2016   Other and combined forms of senile cataract 07/15/2015   Dyspnea    Left hip pain 04/30/2015   Sciatica 04/30/2015   Knee pain, right 04/30/2015   Bilateral carpal tunnel syndrome 03/04/2015   Hyperlipidemia 03/01/2015   Pulmonary nodules 02/12/2015   External hemorrhoids 02/05/2015   Chronic night sweats 01/08/2015   Atypical chest pain 01/01/2015   Renal insufficiency 07/16/2014   Memory loss 05/22/2014   Peripheral neuropathy 05/22/2014    Abdominal pain 10/26/2013   Headache 10/13/2013   Arthralgia 08/18/2013   ANA positive 07/29/2013   Depression 02/05/2013   Family history of malignant hyperthermia 02/05/2013   Malignant neoplasm of lower-outer quadrant of right breast of female, estrogen receptor positive ([place]) 01/03/2013   Splenic lesion 09/02/2012   Asthma, allergic 08/05/2012   GERD (gastroesophageal reflux disease) 06/03/2012   Edema 04/08/2012   Stricture and stenosis of esophagus 10/19/2011   Functional constipation 07/21/2011   Nonspecific (abnormal)  findings on radiological and other examination of biliary tract 07/21/2011   Osteoporosis 04/17/2011   Leg pain 02/24/2011   FIBROCYSTIC BREAST DISEASE, HX OF 10/18/2010   Essential hypertension 08/25/2010   CAD in native artery 08/25/2010   TOBACCO ABUSE, HX OF 08/25/2010   GENERALIZED ANXIETY DISORDER 08/03/2010   Fibromyalgia 01/11/2010   REFERRING DIAG: R15.9 (ICD-10-CM) - Full incontinence of feces   THERAPY DIAG:  Muscle weakness (generalized)   Other lack of coordination   Generalized abdominal pain   Rationale for Evaluation and Treatment Rehabilitation   ONSET DATE: 2019   SUBJECTIVE:                                                                                                                                                                                            SUBJECTIVE STATEMENT:  Abdominal pain is 60% better. Stool leakage is 1x per week instead of daily. Fecal urgency improved by 50%.   PAIN:  Are you having pain? Yes NPRS scale: 5/10 Pain location:  abdomen   Pain type: stabbing, hot Pain description: constant    Aggravating factors: when stomach is bloated, touching the abdomen Relieving factors: when completely empty from stool,  PAIN:  Are you having pain? Yes: NPRS scale: 7/10 Pain location: low back Pain description: intermittent Aggravating factors: movement ,standing up, supine to sitting Relieving factors:  laying down     PRECAUTIONS: Other: Breast cancer with radiation   WEIGHT BEARING RESTRICTIONS No   FALLS:  Has patient fallen in last 6 months? No, cane is helping   LIVING ENVIRONMENT: Lives with: lives with their family   OCCUPATION: retired   PLOF: Independent   PATIENT GOALS reduce leakage and abdominal pain, increase strength of anal sphincter   PERTINENT HISTORY:  Breast cancer 07/2012 with radiation 11/12/2012-12.12.2013, CAD (coronary artery disease); Chronic kidney disease stage 3; Fibromyalgia; Sjogren's disease ; Osteoporosis; TOTAL ABDOMINAL HYSTERECTOMY W/ BILATERAL SALPINGOOPHORECTOMY 1982     BOWEL MOVEMENT Pain with bowel movement: Yes, due to hemorrhoids that bleed and are painful Type of bowel movement:Type (Bristol Stool Scale) Type 1, 4, 5, 7, Frequency 1-5 days, and Strain no by holding abdomen and leaning forward.  Fully empty rectum: No Leakage: Yes: 1 accident per week , gets fecal urgency Pads: None Fiber supplement: Yes: miralax   URINATION Pain with urination: Yes, due to the UTI Fully empty bladder: No,  Stream: Strong and Weak Urgency: yes when she is out  Frequency: urinate every 2-3 hours Leakage: just in the morning if she does not go first thing in the morning.  Pads: Yes: 1 depends per day  INTERCOURSE Pain with intercourse: Initial Penetration, uses moisturizers around the vaginal canal Ability to have vaginal penetration:  No Climax: has not had a climax for a long time Marinoff Scale: 3/3   PREGNANCY Vaginal deliveries 3 Tearing episiotomy 3 times   PROLAPSE None       OBJECTIVE:      PATIENT SURVEYS:  PFIQ-7 43, UIQ-7 0, CRAIQ-7 14, POPIQ-7 29   COGNITION:            Overall cognitive status: Within functional limits for tasks assessed                              GAIT: Distance walked: walks with a cane slowly Assistive device utilized: Single point cane Level of assistance: Complete Independence                 POSTURE: decreased lumbar lordosis, scoliosis, left ilium higher than right               PELVIC ALIGNMENT:   LUMBARAROM/PROM   A/PROM A/PROM  eval  Flexion Decreased by 50%  Extension Decreased by 50%  Right lateral flexion Decreased by 50%  Left lateral flexion Decreased by 50%  Right rotation Decreased by 50%  Left rotation Decreased by 50%   (Blank rows = not tested)   LOWER EXTREMITY ROM: bilateral hip ROM is full.      LOWER EXTREMITY MMT:   MMT Right eval Left eval Right 08/09/2022 Left 08/09/2022 Right 08/30/2022 Left  08/30/2022  Hip flexion 4/5 4/5 4/5 4/5 4+/5 4+/5  Hip extension 4/5 4/5 4/5 4/5 4/5 4/5  Hip abduction 4/5 4/5 4/5 4/5 4/5 4/5     PALPATION:   General  tenderness throughout the abdomen                 External Perineal Exam labia minora is fused to the labia majora,                              Internal Pelvic Floor redness located on the bottom half of the urethra, tenderness located on levator ani and obturator internist, along the right side of the introitus.    Patient confirms identification and approves PT to assess internal pelvic floor and treatment Yes   PELVIC MMT:   MMT eval 08/16/2022 08/23/2022  Vaginal 3/5   3/5   Internal Anal Sphincter 2/5 3/5 3/5  External Anal Sphincter 2/5 3/5 3/5  Puborectalis 2/5 3/5 3/5  (Blank rows = not tested) No emotional/communication barriers or cognitive limitation. Patient is motivated to learn. Patient understands and agrees with treatment goals and plan. PT explains patient will be examined in standing, sitting, and lying down to see how their muscles and joints work. When they are ready, they will be asked to remove their underwear so PT can examine their perineum. The patient is also given the option of providing their own chaperone as one is not provided in our facility. The patient also has the right and is explained the right to defer or refuse any part of the evaluation or treatment including  the internal exam. With the patient's consent, PT will use one gloved finger to gently assess the muscles of the pelvic floor, seeing how well it contracts and relaxes and if there is muscle symmetry. After, the patient will get dressed and PT and patient will discuss exam  findings and plan of care. PT and patient discuss plan of care, schedule, attendance policy and HEP activities.         TONE: Increased tone vaginally   PROLAPSE: none   TODAY'S TREATMENT  08/30/2022 Exercises: Stretches/mobility: Strengthening:supine ball squeeze contract the lower abdominal and pelvic floor 15x Hooklying Isometric Hip Flexion  hold 5 sec 10x each side with abdominal contraction and pelvic floor contraction.  Hookly press arms and trunk into mat with abdominal and anal contraction hold 5 sec 10x Hookly squeeze ball between hands contraction the anus and abdomen with shoulder flexion 15x    08/23/2022 Manual: Internal pelvic floor techniques:No emotional/communication barriers or cognitive limitation. Patient is motivated to learn. Patient understands and agrees with treatment goals and plan. PT explains patient will be examined in standing, sitting, and lying down to see how their muscles and joints work. When they are ready, they will be asked to remove their underwear so PT can examine their perineum. The patient is also given the option of providing their own chaperone as one is not provided in our facility. The patient also has the right and is explained the right to defer or refuse any part of the evaluation or treatment including the internal exam. With the patient's consent, PT will use one gloved finger to gently assess the muscles of the pelvic floor, seeing how well it contracts and relaxes and if there is muscle symmetry. After, the patient will get dressed and PT and patient will discuss exam findings and plan of care. PT and patient discuss plan of care, schedule, attendance policy and HEP  activities.                     Going through the anus working on the left internal sphincter and anterior wall of the rectum, manual work on the puborectalis to improve the forward motion                    Tactile cues to contract the anus in a circular motion and lift                    Manual work to the perineal body internally and externally to improve the anterior motion of the rectum Neuromuscular re-education: Core retraining:hip flexion isometric holding 5 seconds with pelvic floor contraction 5x each side Hip adduction with ball squeeze holding 5 sec with anal contraction 10x Sitting anal contraction holding for 5 sec 10x Sit to stand with pelvic floor contraction 5x without holding on Pelvic floor contraction training:tactile cues to the rectum to improve contraction    08/16/2022 Manual: Internal pelvic floor techniques:No emotional/communication barriers or cognitive limitation. Patient is motivated to learn. Patient understands and agrees with treatment goals and plan. PT explains patient will be examined in standing, sitting, and lying down to see how their muscles and joints work. When they are ready, they will be asked to remove their underwear so PT can examine their perineum. The patient is also given the option of providing their own chaperone as one is not provided in our facility. The patient also has the right and is explained the right to defer or refuse any part of the evaluation or treatment including the internal exam. With the patient's consent, PT will use one gloved finger to gently assess the muscles of the pelvic floor, seeing how well it contracts and relaxes and if there is muscle symmetry. After, the patient will get dressed and  PT and patient will discuss exam findings and plan of care. PT and patient discuss plan of care, schedule, attendance policy and HEP activities.                                   Going through the vagina performing manual work to the perineal  body, along the anococcygeal ligament, along the anal sphincter and levator ani   Neuromuscular re-education: Pelvic floor contraction training:tactile cues to the anus to contract with a lift and not contract the gluteal. Holding 10 sec and fully relaxing then working on quick flicks.      PATIENT EDUCATION:  08/16/2022 Education details:  Access Code: [email or web address] Person educated: Patient Education method: Explanation, Demonstration, Tactile cues, Verbal cues, and Handouts Education comprehension: verbalized understanding, returned demonstration, verbal cues required, tactile cues required, and needs further education     HOME EXERCISE PROGRAM: 08/16/2022  Access Code: [facility] URL: [email or web address] Date: 08/16/2022 Prepared by: Earlie Counts   Exercises - Hooklying Isometric Hip Flexion  - 1 x daily - 7 x weekly - 5 sets - 10 reps - 5 sec hold - Supine Hip Adduction Isometric with Ball  - 1 x daily - 7 x weekly - 1 sets - 10 reps - Seated Pelvic Floor Contraction  - 3 x daily - 7 x weekly - 1 sets - 10 reps - 10 sec hold - Seated Quick Flick Pelvic Floor Contractions  - 3 x daily - 7 x weekly - 1 sets - 5 reps ASSESSMENT:   CLINICAL IMPRESSION: Patient is a 76 y.o. female who was seen today for physical therapy  treatment for fecal incontinence.  Abdominal pain is 60% better.Stool leakage is 1x per week instead of daily.Fecal urgency improved by 50%. Patient does not have to push on the suprapubic area anymore so she is able to empty her bladder now. Patient will only leak urine if she does not go to the bathroom first thing in the morning and does not leak with cough, sneeze laugh and lift. She is not having urge to void with urine just with bowels. PFIQ-7 43 improved from 171. Pelvic floor strength is 3/5. Patient will benefit from skilled therapy to improve pelvic floor coordination, reduce pain and improve leakage.      OBJECTIVE IMPAIRMENTS decreased activity  tolerance, decreased coordination, decreased endurance, decreased strength, increased fascial restrictions, increased muscle spasms, impaired tone, and pain.    ACTIVITY LIMITATIONS lifting, bending, continence, and toileting   PARTICIPATION LIMITATIONS: cleaning, interpersonal relationship, driving, and community activity   PERSONAL FACTORS Breast cancer 07/2012 with radiation 11/12/2012-12.12.2013, CAD (coronary artery disease); Chronic kidney disease stage 3; Fibromyalgia; Sjogren's disease ; Osteoporosis; TOTAL ABDOMINAL HYSTERECTOMY W/ BILATERAL SALPINGOOPHORECTOMY 1982 are also affecting patient's functional outcome.    REHAB POTENTIAL: Excellent   CLINICAL DECISION MAKING: Evolving/moderate complexity   EVALUATION COMPLEXITY: Moderate     GOALS: Goals reviewed with patient? Yes   SHORT TERM GOALS: Target date: 07/10/2022   Patient educated on vaginal moisturizers and lubricant to improve vaginal health.  Baseline: Goal status: met 07/05/2022   2.  Patient independent with initial HEP for pelvic floor relaxation, contraction and abdominal work.  Baseline:  Goal status: met 07/28/2022   3.  Patient reports her abdominal pain is </= 25%.  Baseline:  Goal status: Met 07/12/2022    LONG TERM GOALS: Target date: 09/04/2022    Patient independent with advanced HEP  for pelvic floor, core, and hip strength.  Baseline:  Goal status:ongoing 07/28/2022  2.  Patient reports her urinary leakage has improved >/= 75% due to improved strength and reduction of pain.  Baseline: no leakage Goal status: met 07/28/2022   3.  Patient reports her fecal leakage has improved >/= 75% due to increased in rectal strength.  Baseline: improved 50%.  Goal status: ongoing 07/28/2022   4.  Patient reports her abdominal pain has improved >/= 50%.  Baseline:  Goal status: met 08/24/3031   5.  Patient is able to have penile penetration with 50% less pain due to improve vaginal moisture and reduction of trigger  points in the pelvic floor.  Baseline:  Goal status: ongoing 07/28/2022     PLAN: PT FREQUENCY: 1x/week   PT DURATION: 12 weeks   PLANNED INTERVENTIONS: Therapeutic exercises, Therapeutic activity, Neuromuscular re-education, Patient/Family education, Joint mobilization, Electrical stimulation, Cryotherapy, Moist heat, Biofeedback, and Manual therapy   PLAN FOR NEXT SESSION:  manual work to abdomen and education on abdominal massage, work on abdominal contraction and rectal contraction; nustep  Earlie Counts, PT 08/30/22 12:25 PM

## 2022-08-31 DIAGNOSIS — N3010 Interstitial cystitis (chronic) without hematuria: Secondary | ICD-10-CM | POA: Diagnosis not present

## 2022-09-04 DIAGNOSIS — M353 Polymyalgia rheumatica: Secondary | ICD-10-CM | POA: Diagnosis not present

## 2022-09-04 DIAGNOSIS — M1990 Unspecified osteoarthritis, unspecified site: Secondary | ICD-10-CM | POA: Diagnosis not present

## 2022-09-04 DIAGNOSIS — M898X9 Other specified disorders of bone, unspecified site: Secondary | ICD-10-CM | POA: Diagnosis not present

## 2022-09-04 DIAGNOSIS — M810 Age-related osteoporosis without current pathological fracture: Secondary | ICD-10-CM | POA: Diagnosis not present

## 2022-09-04 DIAGNOSIS — M3501 Sicca syndrome with keratoconjunctivitis: Secondary | ICD-10-CM | POA: Diagnosis not present

## 2022-09-07 ENCOUNTER — Other Ambulatory Visit: Payer: Self-pay | Admitting: Family Medicine

## 2022-09-07 DIAGNOSIS — M5416 Radiculopathy, lumbar region: Secondary | ICD-10-CM | POA: Diagnosis not present

## 2022-09-08 ENCOUNTER — Telehealth: Payer: Self-pay | Admitting: Cardiology

## 2022-09-08 NOTE — Telephone Encounter (Signed)
Patient can print out and sign patient assistance application from [employer] or we can print it out and she can come into office and sign and we can submit to company

## 2022-09-08 NOTE — Telephone Encounter (Signed)
Pt c/o medication issue:  1. Name of Medication:   Evolocumab (REPATHA SURECLICK) 499 MG/ML SOAJ  2. How are you currently taking this medication (dosage and times per day)? As prescribed  3. Are you having a reaction (difficulty breathing--STAT)? No  4. What is your medication issue?   Patient stated she is in the donut hole and will need help getting this medication through the end of the year.

## 2022-09-14 DIAGNOSIS — N183 Chronic kidney disease, stage 3 unspecified: Secondary | ICD-10-CM | POA: Diagnosis not present

## 2022-09-14 DIAGNOSIS — R102 Pelvic and perineal pain: Secondary | ICD-10-CM | POA: Diagnosis not present

## 2022-09-21 ENCOUNTER — Ambulatory Visit (INDEPENDENT_AMBULATORY_CARE_PROVIDER_SITE_OTHER): Payer: Medicare Other

## 2022-09-21 ENCOUNTER — Encounter: Payer: Self-pay | Admitting: Adult Health

## 2022-09-21 ENCOUNTER — Ambulatory Visit: Payer: Medicare Other | Admitting: Adult Health

## 2022-09-21 VITALS — BP 98/60 | HR 74 | Temp 98.3°F | Ht 64.5 in | Wt 134.0 lb

## 2022-09-21 DIAGNOSIS — J309 Allergic rhinitis, unspecified: Secondary | ICD-10-CM

## 2022-09-21 DIAGNOSIS — J45909 Unspecified asthma, uncomplicated: Secondary | ICD-10-CM

## 2022-09-21 DIAGNOSIS — J45901 Unspecified asthma with (acute) exacerbation: Secondary | ICD-10-CM | POA: Diagnosis not present

## 2022-09-21 DIAGNOSIS — J449 Chronic obstructive pulmonary disease, unspecified: Secondary | ICD-10-CM | POA: Diagnosis not present

## 2022-09-21 DIAGNOSIS — Z87891 Personal history of nicotine dependence: Secondary | ICD-10-CM

## 2022-09-21 DIAGNOSIS — R059 Cough, unspecified: Secondary | ICD-10-CM | POA: Diagnosis not present

## 2022-09-21 MED ORDER — FLUTICASONE PROPIONATE HFA 110 MCG/ACT IN AERO: 2.0000 | INHALATION_SPRAY | Freq: Two times a day (BID) | RESPIRATORY_TRACT | 5 refills | Status: DC

## 2022-09-21 MED ORDER — PREDNISONE 20 MG PO TABS: 20.0000 mg | ORAL_TABLET | Freq: Every day | ORAL | 0 refills | Status: DC

## 2022-09-21 MED ORDER — BENZONATATE 200 MG PO CAPS: 200.0000 mg | ORAL_CAPSULE | Freq: Three times a day (TID) | ORAL | 1 refills | Status: DC | PRN

## 2022-09-21 NOTE — Assessment & Plan Note (Signed)
Acute asthmatic bronchitic exacerbation.-Hold on additional antibiotics at this time.  Check chest x-ray today.  Begin maintenance regimen for asthma Flovent twice daily.  Continue on trigger prevention and cough control.  Plan  Patient Instructions  Begin Flovent 2 puffs Twice daily  , rinse after use  Albuterol inhaler As needed   Chest xray today  Continue on Flonase and Claritin daily  Begin Delsym 2 tsp Twice daily  for cough As needed   Begin Tessalon Three times a day  for cough as needed.  Try not to cough or clear throat , use sips of water to soothe throat .  Robitussin cough syrup Three times a day  for thick mucus  Follow up with Dr. Lamonte Sakai in 4-6 weeks or As needed   Please contact office for sooner follow up if symptoms do not improve or worsen or seek emergency care

## 2022-09-21 NOTE — Progress Notes (Signed)
$'@Patient'J$  ID: [name], female    DOB: [birth year], 76 y.o.   MRN: [id number]  Chief Complaint  Patient presents with   Follow-up    Referring provider: Mosie Lukes, MD  HPI: 76 year old female former smoker followed for asthma with an allergic phenotype, allergic rhinitis  Medical history significant for hypertension, coronary artery disease, Sjogren's, breast cancer, interstitial cystitis  TEST/EVENTS :  PFT August 18, 2021 showed severe airflow obstruction with FEV1 at 57%, ratio 63, FVC 68%, significant bronchodilator response with a 26% change, DLCO 70%  09/21/2022 Follow up : Asthma  Patient presents for a follow-up visit.  Patient complains that her asthma has not been doing as well over the last few months.  Patient says that she got COVID-19 infection in January and since then has had intermittent episodes of cough and congestion.  She says she has been treated with a few courses of antibiotics.   She complains over the last week that she has had increased dry cough and wheezing.  She denies any fever, discolored mucus.  Has nasal congestion and drainage. Has previously tried Advair and a few other inhalers in the past but had to stop due to increased tremors.  She says she uses albuterol usually once or twice a day. Patient says she has been having difficulty with balance issues and frequent falls this has been going on for a while now.        Allergies  Allergen Reactions   Clindamycin Rash and Shortness Of Breath   Clindamycin Hcl Shortness Of Breath and Rash   Penicillins Anaphylaxis   Rosuvastatin Anaphylaxis   Baclofen Other (See Comments) and Rash   Lincomycin Other (See Comments)   Prednisone Rash    Other reaction(s): Rash, Hives - can tolerate if she takes with benadryl    Clindamycin Hcl     Other reaction(s): Shortness of Breath, Rash   Codeine Hives and Other (See Comments)    headache Other reaction(s): Headache, Nausea, headache   Erythromycin  Hives   Erythromycin Base Other (See Comments)    other   Fluzone [Influenza Virus Vaccine] Other (See Comments)    Local reaction at the site   Haemophilus Influenzae Other (See Comments)    Local reaction at the site Local reaction at the site   Haemophilus Influenzae Vaccines Other (See Comments)    Local reaction at site   Latex Hives   Levofloxacin     Other reaction(s): sick   Other Other (See Comments)    Local reaction at site   Pentazocine Other (See Comments)   Pentazocine Lactate Other (See Comments)    HALLUCINATION   Pneumococcal Vaccine Hives and Swelling    Other reaction(s): Hives, Shortness of Breath   Pneumococcal Vaccine Polyvalent Hives, Swelling and Other (See Comments)    REACTION: redness, swelling, and hives at injection site   Tamoxifen Nausea And Vomiting and Other (See Comments)    HEADACHE Other reaction(s): Headache, Nausea    Immunization History  Administered Date(s) Administered   Influenza Split 10/10/2011, 09/24/2012   Influenza Whole 10/18/2010   Influenza,inj,Quad PF,6+ Mos 10/13/2013, 09/25/2014   PFIZER(Purple Top)SARS-COV-2 Vaccination 01/29/2020, 02/23/2020, 09/18/2020, 07/25/2021   Pneumococcal Polysaccharide-23 01/27/2011   Tdap 10/21/2013, 06/01/2018   Zoster Recombinat (Shingrix) 07/14/2019, 10/16/2019    Past Medical History:  Diagnosis Date   Allergy    Anemia    Anxiety    past hx    Arthritis    Asthma  Atrial flutter ([place])    past history- not current   Breast cancer ([place])    CAD (coronary artery disease) CARDIOLOGIST--  DR Angelena Form   mild non-obstructive cad   Cancer ([place])    right   Cataract    bilaterally removed    Chronic constipation    Chronic kidney disease    stage 3 ckd, interstitial cystitis   Concussion    x 3   COPD (chronic obstructive pulmonary disease) (HCC)    Depression    past hx    Family history of malignant hyperthermia    father had this   Fibromyalgia    Frequency of urination     GERD (gastroesophageal reflux disease)    H/O hiatal hernia    History of basal cell carcinoma excision    X2   History of breast cancer ONCOLOGIST-- DR Jana Hakim---  NO RECURRANCE   DX 07/2012;  LOW GRADE DCIS  ER+PR+  ----  S/P RIGHT LUMPECTOMY WITH NEGATIVE MARGINS/   RADIATION ENDED 11/2012   History of chronic bronchitis    History of colonic polyps    History of tachycardia    CONTROLLED  WITH ATENOLOL   Hyperlipidemia    Hypertension    Neuromuscular disorder (HCC)    fibromyalgia   Neuropathy    Osteoporosis 01/2019   T score -2.2 stable/improved from prior study   Pelvic pain    Personal history of radiation therapy    S/P radiation therapy 11/12/12 - 12/05/12   right Breast   Sepsis ([place]) 2014   from UTI    Sinus headache    Sjogren's disease ([place])    Urgency of urination     Tobacco History: Social History   Tobacco Use  Smoking Status Former   Packs/day: 1.00   Years: 15.00   Total pack years: 15.00   Types: Cigarettes   Quit date: 05/27/2005   Years since quitting: 17.3  Smokeless Tobacco Never   Counseling given: Not Answered   Outpatient Medications Prior to Visit  Medication Sig Dispense Refill   acetaminophen (TYLENOL) 500 MG tablet Take 1,000 mg by mouth every 6 (six) hours as needed for mild pain or headache.      albuterol (VENTOLIN HFA) 108 (90 Base) MCG/ACT inhaler Inhale 2 puffs into the lungs every 4 (four) hours as needed. 1 each 0   amLODipine (NORVASC) 10 MG tablet Take 1 tablet by mouth once daily 90 tablet 1   aspirin EC 81 MG tablet Take 1 tablet (81 mg total) by mouth daily. 90 tablet 3   atenolol (TENORMIN) 50 MG tablet Take 1 tablet (50 mg total) by mouth 2 (two) times daily. 180 tablet 1   cephALEXin (KEFLEX) 500 MG capsule Take 1 capsule (500 mg total) by mouth 2 (two) times daily. 20 capsule 0   Cyanocobalamin (VITAMIN B-12) 5000 MCG TBDP Take 1 tablet by mouth daily with breakfast.     denosumab (PROLIA) 60 MG/ML SOSY injection  Inject 60 mg into the skin every 6 (six) months.     DULoxetine (CYMBALTA) 30 MG capsule Take 1 capsule (30 mg total) by mouth daily. 30 capsule 1   DULoxetine (CYMBALTA) 60 MG capsule Take 1 capsule (60 mg total) by mouth 2 (two) times daily. 180 capsule 1   Evolocumab (REPATHA SURECLICK) 630 MG/ML SOAJ INJECT 140 MG SUBCUTANEOUSLY EVERY 14 DAYS 6 mL 0   fluticasone (FLONASE) 50 MCG/ACT nasal spray Use 2 spray(s) in each nostril once daily  16 g 5   furosemide (LASIX) 20 MG tablet Take 1 tablet (20 mg total) by mouth as needed for edema. 90 tablet 3   gabapentin (NEURONTIN) 300 MG capsule Take by mouth.     hydroxychloroquine (PLAQUENIL) 200 MG tablet 1 tablet with food or milk     losartan (COZAAR) 100 MG tablet Take 1 tablet (100 mg total) by mouth daily. 90 tablet 2   Meth-Hyo-M Bl-Na Phos-Ph Sal (URIBEL) 118 MG CAPS Take by mouth.     nitroGLYCERIN (NITROSTAT) 0.4 MG SL tablet Place 1 tablet (0.4 mg total) under the tongue every 5 (five) minutes as needed for chest pain. 25 tablet 1   nystatin cream (MYCOSTATIN) Apply 1 application topically 2 (two) times daily. 30 g 2   pantoprazole (PROTONIX) 40 MG tablet Take 1 tablet (40 mg total) by mouth daily. 90 tablet 1   pentosan polysulfate (ELMIRON) 100 MG capsule Take 1 capsule (100 mg total) by mouth 3 (three) times daily. Reported on 01/05/2016 (Patient taking differently: Take 100 mg by mouth 3 (three) times daily with meals as needed. Reported on 01/05/2016) 90 capsule 11   polyethylene glycol (MIRALAX) 17 g packet Take 17 g by mouth daily. 14 each 0   sucralfate (CARAFATE) 1 GM/10ML suspension Take 10 mLs (1 g total) by mouth 4 (four) times daily -  with meals and at bedtime. 420 mL 1   traZODone (DESYREL) 100 MG tablet Take 1 tablet (100 mg total) by mouth at bedtime. 90 tablet 0   doxycycline (VIBRA-TABS) 100 MG tablet Take 1 tablet (100 mg total) by mouth 2 (two) times daily. 20 tablet 0   No facility-administered medications prior to visit.      Review of Systems:   Constitutional:   No  weight loss, night sweats,  Fevers, chills,  +fatigue, or  lassitude.  HEENT:   No headaches,  Difficulty swallowing,  Tooth/dental problems, or  Sore throat,                No sneezing, itching, ear ache,  +nasal congestion, post nasal drip,   CV:  No chest pain,  Orthopnea, PND, swelling in lower extremities, anasarca, dizziness, palpitations, syncope.   GI  No heartburn, indigestion, abdominal pain, nausea, vomiting, diarrhea, change in bowel habits, loss of appetite, bloody stools.   Resp:   No chest wall deformity  Skin: no rash or lesions.  GU: no dysuria, change in color of urine, no urgency or frequency.  No flank pain, no hematuria   MS:  No joint pain or swelling.  No decreased range of motion.  No back pain.    Physical Exam  BP 98/60 (BP Location: Left Arm, Patient Position: Sitting, Cuff Size: Normal)   Pulse 74   Temp 98.3 F (36.8 C) (Oral)   Ht 5' 4.5" (1.638 m)   Wt 134 lb (60.8 kg)   SpO2 98%   BMI 22.65 kg/m   GEN: A/Ox3; pleasant , NAD, well nourished    HEENT:  [place]/AT,  NOSE-clear, THROAT-clear, no lesions, no postnasal drip or exudate noted.   NECK:  Supple w/ fair ROM; no JVD; normal carotid impulses w/o bruits; no thyromegaly or nodules palpated; no lymphadenopathy.    RESP  Clear  P & A; w/o, wheezes/ rales/ or rhonchi. no accessory muscle use, no dullness to percussion  CARD:  RRR, no m/r/g, no peripheral edema, pulses intact, no cyanosis or clubbing.  GI:   Soft & nt; nml bowel  sounds; no organomegaly or masses detected.   Musco: Warm bil, no deformities or joint swelling noted.   Neuro: alert, no focal deficits noted.    Skin: Warm, no lesions or rashes    Lab Results:   BMET   BNP No results found for: "BNP"  ProBNP        Latest Ref Rng & Units 08/18/2021    8:39 AM  PFT Results  FVC-Pre L 1.42   FVC-Predicted Pre % 50   FVC-Post L 1.94   FVC-Predicted Post % 68    Pre FEV1/FVC % % 68   Post FEV1/FCV % % 63   FEV1-Pre L 0.96   FEV1-Predicted Pre % 45   FEV1-Post L 1.22   DLCO uncorrected ml/min/mmHg 13.72   DLCO UNC% % 70   DLCO corrected ml/min/mmHg 14.33   DLCO COR %Predicted % 73   DLVA Predicted % 115   TLC L 4.80   TLC % Predicted % 93   RV % Predicted % 127     No results found for: "NITRICOXIDE"      Assessment & Plan:   Asthma, allergic Acute asthmatic bronchitic exacerbation.-Hold on additional antibiotics at this time.  Check chest x-ray today.  Begin maintenance regimen for asthma Flovent twice daily.  Continue on trigger prevention and cough control.  Plan  Patient Instructions  Begin Flovent 2 puffs Twice daily  , rinse after use  Albuterol inhaler As needed   Chest xray today  Continue on Flonase and Claritin daily  Begin Delsym 2 tsp Twice daily  for cough As needed   Begin Tessalon Three times a day  for cough as needed.  Try not to cough or clear throat , use sips of water to soothe throat .  Robitussin cough syrup Three times a day  for thick mucus  Follow up with Dr. Lamonte Sakai in 4-6 weeks or As needed   Please contact office for sooner follow up if symptoms do not improve or worsen or seek emergency care        Allergic rhinitis Continue on maintenance regimen     Ricka Westra, NP 09/21/2022

## 2022-09-21 NOTE — Patient Instructions (Addendum)
Begin Flovent 2 puffs Twice daily  , rinse after use  Albuterol inhaler As needed   Chest xray today  Continue on Flonase and Claritin daily  Begin Delsym 2 tsp Twice daily  for cough As needed   Begin Tessalon Three times a day  for cough as needed.  Try not to cough or clear throat , use sips of water to soothe throat .  Robitussin cough syrup Three times a day  for thick mucus  Follow up with Dr. Lamonte Sakai in 4-6 weeks or As needed   Please contact office for sooner follow up if symptoms do not improve or worsen or seek emergency care

## 2022-09-21 NOTE — Assessment & Plan Note (Signed)
Continue on maintenance regimen 

## 2022-09-26 NOTE — Progress Notes (Signed)
Called and spoke with patient, advised of results/recommendations per Tammy Parrett NP.  She verbalized understanding.  Nothing further needed.

## 2022-09-27 ENCOUNTER — Ambulatory Visit: Payer: Medicare Other | Admitting: Physical Therapy

## 2022-09-28 ENCOUNTER — Encounter: Payer: Self-pay | Admitting: *Deleted

## 2022-09-28 ENCOUNTER — Telehealth (INDEPENDENT_AMBULATORY_CARE_PROVIDER_SITE_OTHER): Payer: Medicare Other | Admitting: Family Medicine

## 2022-09-28 DIAGNOSIS — R296 Repeated falls: Secondary | ICD-10-CM

## 2022-09-28 DIAGNOSIS — E785 Hyperlipidemia, unspecified: Secondary | ICD-10-CM | POA: Diagnosis not present

## 2022-09-28 DIAGNOSIS — W19XXXD Unspecified fall, subsequent encounter: Secondary | ICD-10-CM

## 2022-09-28 DIAGNOSIS — I10 Essential (primary) hypertension: Secondary | ICD-10-CM

## 2022-09-28 DIAGNOSIS — R739 Hyperglycemia, unspecified: Secondary | ICD-10-CM | POA: Diagnosis not present

## 2022-09-28 DIAGNOSIS — D649 Anemia, unspecified: Secondary | ICD-10-CM

## 2022-09-28 DIAGNOSIS — Z789 Other specified health status: Secondary | ICD-10-CM

## 2022-09-28 DIAGNOSIS — E538 Deficiency of other specified B group vitamins: Secondary | ICD-10-CM

## 2022-09-28 DIAGNOSIS — E875 Hyperkalemia: Secondary | ICD-10-CM | POA: Insufficient documentation

## 2022-09-28 DIAGNOSIS — Z0000 Encounter for general adult medical examination without abnormal findings: Secondary | ICD-10-CM

## 2022-09-28 DIAGNOSIS — M810 Age-related osteoporosis without current pathological fracture: Secondary | ICD-10-CM

## 2022-09-28 HISTORY — DX: Hyperkalemia: E87.5

## 2022-09-28 MED ORDER — LOSARTAN POTASSIUM 25 MG PO TABS: 25.0000 mg | ORAL_TABLET | Freq: Every day | ORAL | 1 refills | Status: DC

## 2022-09-28 NOTE — Assessment & Plan Note (Signed)
Encouraged to get adequate exercise, calcium and vitamin d intake. Tolerating Prolia 

## 2022-09-28 NOTE — Assessment & Plan Note (Signed)
Unable to tolerate statins, will continue to monitor lipids

## 2022-09-28 NOTE — Assessment & Plan Note (Signed)
Mild, asymptomatic, monitor

## 2022-09-28 NOTE — Assessment & Plan Note (Signed)
Supplement and monitor 

## 2022-09-28 NOTE — Assessment & Plan Note (Signed)
[id number] acceptable, minimize simple carbs. Increase exercise as tolerated.  

## 2022-09-28 NOTE — Assessment & Plan Note (Addendum)
Monitor and report any concerns, no changes to meds. Encouraged heart healthy diet such as the DASH diet and exercise as tolerated. She notes her Losartan was decreased from 100 to 50 mg because her blood pressure was running low and now it is still running low so we will drop her to 25 mg daily. reassess

## 2022-09-28 NOTE — Assessment & Plan Note (Signed)
Mild, asymptomatic, continue to monitor

## 2022-09-28 NOTE — Assessment & Plan Note (Signed)
RSV (respiratory syncitial virus) vaccine at pharmacy, Arexvy Covid booster new version At pharmacy High dose flu shot

## 2022-09-28 NOTE — Assessment & Plan Note (Signed)
Encourage heart healthy diet such as MIND or DASH diet, increase exercise, avoid trans fats, simple carbohydrates and processed foods, consider a krill or fish or flaxseed oil cap daily.  °

## 2022-09-29 ENCOUNTER — Other Ambulatory Visit: Payer: Medicare Other

## 2022-09-29 DIAGNOSIS — R296 Repeated falls: Secondary | ICD-10-CM | POA: Insufficient documentation

## 2022-09-29 DIAGNOSIS — W19XXXA Unspecified fall, initial encounter: Secondary | ICD-10-CM | POA: Insufficient documentation

## 2022-09-29 NOTE — Assessment & Plan Note (Signed)
She fell in October 2022 and did not fall again until October of 2023. She has no broken bones and she is working with Dr Nelva Bush to manage her flare in pain since the fall. The pain is largely on the left side and she is managing. Has completed PT and may consider again in the future if falls become more recurrent.

## 2022-09-29 NOTE — Progress Notes (Signed)
  MyChart Video Visit    Virtual Visit via Video Note   This visit type was conducted due to national recommendations for restrictions regarding the COVID-19 Pandemic (e.g. social distancing) in an effort to limit this patient's exposure and mitigate transmission in our community. This patient is at least at moderate risk for complications without adequate follow up. This format is felt to be most appropriate for this patient at this time. Physical exam was limited by quality of the video and audio technology used for the visit. Shamaine, CMA was able to get the patient set up on a video visit.  Patient location: home Patient and provider in visit Provider location: Office  I discussed the limitations of evaluation and management by telemedicine and the availability of in person appointments. The patient expressed understanding and agreed to proceed.  Visit Date: 09/28/2022  Today's healthcare provider: Stacey Blyth, MD     Subjective:    Patient ID: [name], female    DOB: 07/14/1946, 76 y.o.   MRN: [id number]  Chief Complaint  Patient presents with   Follow-up    Follow up     HPI Patient is in today for follow-up on chronic medical concerns.  No recent febrile illness or hospitalizations but she has had a recent fall.  The first 1 in a year.  She is following with Dr. Ramo's at [facility] and they have been trying to manage her increase in pain on her left side since the fall.  She is still functioning.  She continues to follow with her rheumatologist, Dr Syed.  She reports that they have recommended she follow-up with endocrinology but she is unclear why.  She Continues to see nephrology as well.  Also follows with dermatology.  She is noting that itchiness on her scalp and in her ears has returned and she is unclear why.  No other places are particularly itchy and they are not red or painful.  She notes her losartan has been decreased from 100-50 because of low blood  pressures and she continues to see blood pressures as low as 87/52 and feel weak.  Her pulse is ranging from 68-98. Denies CP/palp/SOB/HA/congestion/fevers/GI or GU c/o. Taking meds as prescribed   Past Medical History:  Diagnosis Date   Allergy    Anemia    Anxiety    past hx    Arthritis    Asthma    Atrial flutter (HCC)    past history- not current   Breast cancer (HCC)    CAD (coronary artery disease) CARDIOLOGIST--  DR MCALHANY   mild non-obstructive cad   Cancer (HCC)    right   Cataract    bilaterally removed    Chronic constipation    Chronic kidney disease    stage 3 ckd, interstitial cystitis   Concussion    x 3   COPD (chronic obstructive pulmonary disease) (HCC)    Depression    past hx    Family history of malignant hyperthermia    father had this   Fibromyalgia    Frequency of urination    GERD (gastroesophageal reflux disease)    H/O hiatal hernia    History of basal cell carcinoma excision    X2   History of breast cancer ONCOLOGIST-- DR MAGRINAT---  NO RECURRANCE   DX 07/2012;  LOW GRADE DCIS  ER+PR+  ----  S/P RIGHT LUMPECTOMY WITH NEGATIVE MARGINS/   RADIATION ENDED 11/2012   History of chronic bronchitis      History of colonic polyps    History of tachycardia    CONTROLLED  WITH ATENOLOL   Hyperlipidemia    Hypertension    Neuromuscular disorder (HCC)    fibromyalgia   Neuropathy    Osteoporosis 01/2019   T score -2.2 stable/improved from prior study   Pelvic pain    Personal history of radiation therapy    S/P radiation therapy 11/12/12 - 12/05/12   right Breast   Sepsis ([place]) 2014   from UTI    Sinus headache    Sjogren's disease ([place])    Urgency of urination     Past Surgical History:  Procedure Laterality Date   69 HOUR [place] STUDY N/A 04/03/2016   Procedure: 24 HOUR PH STUDY;  Surgeon: Manus Gunning, MD;  Location: [facility];  Service: Gastroenterology;  Laterality: N/A;   BREAST BIOPSY Right 08/23/2012   ADH   BREAST  BIOPSY Right 10/11/2012   Ductal Carcinoma   BREAST EXCISIONAL BIOPSY Right 09/04/2013   benign   BREAST LUMPECTOMY Right 10-11-2012   W/ SLN BX   CARDIAC CATHETERIZATION  09-13-2007  DR Lia Foyer   WELL-PRESERVED LVF/  DIFFUSE SCATTERED CORONARY CALCIFACATION AND ATHEROSCLEROSIS WITHOUT OBSTRUCTION   CARDIAC CATHETERIZATION  08-04-2010  DR MCALHANY   NON-OBSTRUCTIVE CAD/  pLAD 40%/  oLAD 30%/  mLAD 30%/  pRCA 30%/  EF 60%   CARDIOVASCULAR STRESS TEST  06-18-2012  DR McALHANY   LOW RISK NUCLEAR STUDY/  SMALL FIXED AREA OF MODERATELY DECREASED UPTAKE IN ANTEROSEPTAL WALL WHICH MAY BE ARTIFACTUAL/  NO ISCHEMIA/  EF 68%   COLONOSCOPY  09-29-2010   CYSTOSCOPY     CYSTOSCOPY WITH HYDRODISTENSION AND BIOPSY N/A 03/06/2014   Procedure: CYSTOSCOPY/HYDRODISTENSION/ INSTILATION OF MARCAINE AND PYRIDIUM;  Surgeon: Ailene Rud, MD;  Location: [facility];  Service: Urology;  Laterality: N/A;   ESOPHAGEAL MANOMETRY N/A 04/03/2016   Procedure: ESOPHAGEAL MANOMETRY (EM);  Surgeon: Manus Gunning, MD;  Location: [facility];  Service: Gastroenterology;  Laterality: N/A;   EXTRACORPOREAL SHOCK WAVE LITHOTRIPSY Left 02/06/2019   Procedure: EXTRACORPOREAL SHOCK WAVE LITHOTRIPSY (ESWL);  Surgeon: Kathie Rhodes, MD;  Location: [facility];  Service: Urology;  Laterality: Left;   NASAL SINUS SURGERY  1985   ORIF RIGHT ANKLE  FX  2006   POLYPECTOMY     REMOVAL VOCAL CORD CYST  FEB 2014   RIGHT BREAST BX  08-23-2012   RIGHT HAND SURGERY  X3  LAST ONE 2009   INCLUDES  ORIF RIGHT 5TH FINGER AND REVISION TWICE   SKIN CANCER EXCISION     TONSILLECTOMY AND ADENOIDECTOMY  AGE 9   TOTAL ABDOMINAL HYSTERECTOMY W/ BILATERAL SALPINGOOPHORECTOMY  1982   W/  APPENDECTOMY   TRANSTHORACIC ECHOCARDIOGRAM  06-24-2012   GRADE I DIASTOLIC DYSFUNCTION/  EF 55-60%/  MILD MR   UPPER GASTROINTESTINAL ENDOSCOPY      Family History  Problem Relation Age of Onset   Rectal cancer Mother    Colon cancer  Mother    Pancreatic cancer Mother    Diabetes Mother    Breast cancer Mother 38   Breast cancer Maternal Aunt        breast   Birth defects Maternal Aunt    Irritable bowel syndrome Son    Heart disease Son        CAD, MV replacement   Allergic Disorder Daughter    Diabetes Daughter    Colon cancer Father    Colon polyps Father  Diabetes Father    Stroke Father    Heart disease Father        CHF   Hyperlipidemia Father    Hypertension Father    Arthritis Father    Breast cancer Paternal Aunt    Breast cancer Paternal Aunt    Arthritis Paternal Uncle    Allergic Disorder Daughter    Heart disease Cousin        CAD, had a blood clot after stenting   Esophageal cancer Neg Hx    Stomach cancer Neg Hx     Social History   Socioeconomic History   Marital status: Married    Spouse name: Ronald    Number of children: 3   Years of education: BA   Highest education level: Not on file  Occupational History   Occupation: Retired RN    Employer: RETIRED  Tobacco Use   Smoking status: Former    Packs/day: 1.00    Years: 15.00    Total pack years: 15.00    Types: Cigarettes    Quit date: 05/27/2005    Years since quitting: 17.3   Smokeless tobacco: Never  Vaping Use   Vaping Use: Never used  Substance and Sexual Activity   Alcohol use: No    Alcohol/week: 0.0 standard drinks of alcohol   Drug use: No   Sexual activity: Not Currently    Partners: Male    Birth control/protection: Surgical    Comment: 1st intercourse 76 yo-Fewer than 5 partners, hysterectomy  Other Topics Concern   Not on file  Social History Narrative   Lives with husband.   Caffeine use: 1/2 cup per day   Exercise-- 2days a week [employer],  Water aerobics, walking       Retired nurse   No dietary restrictions, tries to maintain a heart healthy diet   Social Determinants of Health   Financial Resource Strain: Low Risk  (02/28/2022)   Overall Financial Resource Strain (CARDIA)    Difficulty of  Paying Living Expenses: Not hard at all  Food Insecurity: No Food Insecurity (02/28/2022)   Hunger Vital Sign    Worried About Running Out of Food in the Last Year: Never true    Ran Out of Food in the Last Year: Never true  Transportation Needs: No Transportation Needs (02/28/2022)   PRAPARE - Transportation    Lack of Transportation (Medical): No    Lack of Transportation (Non-Medical): No  Physical Activity: Not on file  Stress: Not on file  Social Connections: Socially Integrated (02/28/2022)   Social Connection and Isolation Panel [NHANES]    Frequency of Communication with Friends and Family: More than three times a week    Frequency of Social Gatherings with Friends and Family: More than three times a week    Attends Religious Services: More than 4 times per year    Active Member of Clubs or Organizations: Yes    Attends Club or Organization Meetings: More than 4 times per year    Marital Status: Married  Intimate Partner Violence: Not At Risk (02/28/2022)   Humiliation, Afraid, Rape, and Kick questionnaire    Fear of Current or Ex-Partner: No    Emotionally Abused: No    Physically Abused: No    Sexually Abused: No    Outpatient Medications Prior to Visit  Medication Sig Dispense Refill   acetaminophen (TYLENOL) 500 MG tablet Take 1,000 mg by mouth every 6 (six) hours as needed for mild pain or headache.        albuterol (VENTOLIN HFA) 108 (90 Base) MCG/ACT inhaler Inhale 2 puffs into the lungs every 4 (four) hours as needed. 1 each 0   amLODipine (NORVASC) 10 MG tablet Take 1 tablet by mouth once daily 90 tablet 1   aspirin EC 81 MG tablet Take 1 tablet (81 mg total) by mouth daily. 90 tablet 3   atenolol (TENORMIN) 50 MG tablet Take 1 tablet (50 mg total) by mouth 2 (two) times daily. 180 tablet 1   benzonatate (TESSALON) 200 MG capsule Take 1 capsule (200 mg total) by mouth 3 (three) times daily as needed. 45 capsule 1   Cyanocobalamin (VITAMIN B-12) 5000 MCG TBDP Take 1 tablet by  mouth daily with breakfast.     denosumab (PROLIA) 60 MG/ML SOSY injection Inject 60 mg into the skin every 6 (six) months.     DULoxetine (CYMBALTA) 30 MG capsule Take 1 capsule (30 mg total) by mouth daily. 30 capsule 1   DULoxetine (CYMBALTA) 60 MG capsule Take 1 capsule (60 mg total) by mouth 2 (two) times daily. 180 capsule 1   Evolocumab (REPATHA SURECLICK) 376 MG/ML SOAJ INJECT 140 MG SUBCUTANEOUSLY EVERY 14 DAYS 6 mL 0   fluticasone (FLONASE) 50 MCG/ACT nasal spray Use 2 spray(s) in each nostril once daily 16 g 5   fluticasone (FLOVENT HFA) 110 MCG/ACT inhaler Inhale 2 puffs into the lungs 2 (two) times daily. 1 each 5   furosemide (LASIX) 20 MG tablet Take 1 tablet (20 mg total) by mouth as needed for edema. 90 tablet 3   gabapentin (NEURONTIN) 300 MG capsule Take 1-2 capsules (300-600 mg total) by mouth 3 (three) times daily.     hydroxychloroquine (PLAQUENIL) 200 MG tablet 1 tablet with food or milk     Meth-Hyo-M Bl-Na Phos-Ph Sal (URIBEL) 118 MG CAPS Take by mouth.     nitroGLYCERIN (NITROSTAT) 0.4 MG SL tablet Place 1 tablet (0.4 mg total) under the tongue every 5 (five) minutes as needed for chest pain. 25 tablet 1   nystatin cream (MYCOSTATIN) Apply 1 application topically 2 (two) times daily. 30 g 2   pantoprazole (PROTONIX) 40 MG tablet Take 1 tablet (40 mg total) by mouth daily. 90 tablet 1   pentosan polysulfate (ELMIRON) 100 MG capsule Take 1 capsule (100 mg total) by mouth 3 (three) times daily. Reported on 01/05/2016 (Patient taking differently: Take 100 mg by mouth 3 (three) times daily with meals as needed. Reported on 01/05/2016) 90 capsule 11   polyethylene glycol (MIRALAX) 17 g packet Take 17 g by mouth daily. 14 each 0   predniSONE (DELTASONE) 20 MG tablet Take 1 tablet (20 mg total) by mouth daily with breakfast. 5 tablet 0   sucralfate (CARAFATE) 1 GM/10ML suspension Take 10 mLs (1 g total) by mouth 4 (four) times daily -  with meals and at bedtime. 420 mL 1   traZODone  (DESYREL) 100 MG tablet Take 1 tablet (100 mg total) by mouth at bedtime. 90 tablet 0   cephALEXin (KEFLEX) 500 MG capsule Take 1 capsule (500 mg total) by mouth 2 (two) times daily. 20 capsule 0   gabapentin (NEURONTIN) 300 MG capsule Take by mouth.     losartan (COZAAR) 100 MG tablet Take 1 tablet (100 mg total) by mouth daily. 90 tablet 2   No facility-administered medications prior to visit.    Allergies  Allergen Reactions   Clindamycin Rash and Shortness Of Breath   Clindamycin Hcl Shortness Of Breath and Rash  Penicillins Anaphylaxis   Rosuvastatin Anaphylaxis   Baclofen Other (See Comments) and Rash   Lincomycin Other (See Comments)   Prednisone Rash    Other reaction(s): Rash, Hives - can tolerate if she takes with benadryl    Clindamycin Hcl     Other reaction(s): Shortness of Breath, Rash   Codeine Hives and Other (See Comments)    headache Other reaction(s): Headache, Nausea, headache   Erythromycin Hives   Erythromycin Base Other (See Comments)    other   Fluzone [Influenza Virus Vaccine] Other (See Comments)    Local reaction at the site   Haemophilus Influenzae Other (See Comments)    Local reaction at the site Local reaction at the site   Haemophilus Influenzae Vaccines Other (See Comments)    Local reaction at site   Latex Hives   Levofloxacin     Other reaction(s): sick   Other Other (See Comments)    Local reaction at site   Pentazocine Other (See Comments)   Pentazocine Lactate Other (See Comments)    HALLUCINATION   Pneumococcal Vaccine Hives and Swelling    Other reaction(s): Hives, Shortness of Breath   Pneumococcal Vaccine Polyvalent Hives, Swelling and Other (See Comments)    REACTION: redness, swelling, and hives at injection site   Tamoxifen Nausea And Vomiting and Other (See Comments)    HEADACHE Other reaction(s): Headache, Nausea    Review of Systems  Constitutional:  Negative for fever and malaise/fatigue.  HENT:  Negative for  congestion.   Eyes:  Negative for blurred vision.  Respiratory:  Negative for shortness of breath.   Cardiovascular:  Negative for chest pain, palpitations and leg swelling.  Gastrointestinal:  Negative for abdominal pain, blood in stool and nausea.  Genitourinary:  Negative for dysuria and frequency.  Musculoskeletal:  Positive for back pain, falls, joint pain and myalgias.  Skin:  Positive for itching. Negative for rash.       She notes the worst itching is in her ears and on her head. No obvious rash.   Neurological:  Negative for dizziness, loss of consciousness and headaches.  Endo/Heme/Allergies:  Negative for environmental allergies.  Psychiatric/Behavioral:  Negative for depression. The patient is nervous/anxious.        Objective:    Physical Exam Constitutional:      General: She is not in acute distress.    Appearance: Normal appearance. She is not ill-appearing or toxic-appearing.  HENT:     Head: Normocephalic and atraumatic.     Right Ear: External ear normal.     Left Ear: External ear normal.     Nose: Nose normal.  Eyes:     General:        Right eye: No discharge.        Left eye: No discharge.  Pulmonary:     Effort: Pulmonary effort is normal.  Skin:    Findings: No rash.  Neurological:     Mental Status: She is alert and oriented to person, place, and time.  Psychiatric:        Behavior: Behavior normal.     BP (!) 90/57 (BP Location: Right Arm, Patient Position: Sitting, Cuff Size: Normal)  Wt Readings from Last 3 Encounters:  09/21/22 134 lb (60.8 kg)  08/21/22 134 lb 6 oz (61 kg)  07/04/22 134 lb (60.8 kg)    Diabetic Foot Exam - Simple   No data filed    Lab Results  Component Value Date   WBC 9.9   07/04/2022   HGB 11.6 (L) 07/04/2022   HCT 36.5 07/04/2022   PLT 363.0 07/04/2022   GLUCOSE 98 06/07/2022   CHOL 144 03/21/2022   TRIG 88.0 03/21/2022   HDL 47.50 03/21/2022   LDLDIRECT 117.3 04/08/2012   LDLCALC 79 03/21/2022   ALT 7  06/07/2022   AST 14 06/07/2022   NA 138 06/07/2022   K 5.2 No hemolysis seen (H) 06/07/2022   CL 103 06/07/2022   CREATININE 1.10 06/07/2022   BUN 13 06/07/2022   CO2 29 06/07/2022   TSH 3.22 03/21/2022   INR 1.05 08/03/2010   HGBA1C 5.8 03/21/2022   MICROALBUR 0.2 07/10/2013    Lab Results  Component Value Date   TSH 3.22 03/21/2022   Lab Results  Component Value Date   WBC 9.9 07/04/2022   HGB 11.6 (L) 07/04/2022   HCT 36.5 07/04/2022   MCV 89.2 07/04/2022   PLT 363.0 07/04/2022   Lab Results  Component Value Date   NA 138 06/07/2022   K 5.2 No hemolysis seen (H) 06/07/2022   CHLORIDE 102 04/20/2015   CO2 29 06/07/2022   GLUCOSE 98 06/07/2022   BUN 13 06/07/2022   CREATININE 1.10 06/07/2022   BILITOT 0.4 06/07/2022   ALKPHOS 159 (H) 06/07/2022   ALKPHOS 177 (H) 06/07/2022   AST 14 06/07/2022   ALT 7 06/07/2022   PROT 6.7 06/07/2022   ALBUMIN 4.0 06/07/2022   CALCIUM 9.6 06/07/2022   ANIONGAP 12 (H) 04/20/2015   EGFR 49 (L) 04/20/2015   GFR 48.66 (L) 06/07/2022   Lab Results  Component Value Date   CHOL 144 03/21/2022   Lab Results  Component Value Date   HDL 47.50 03/21/2022   Lab Results  Component Value Date   LDLCALC 79 03/21/2022   Lab Results  Component Value Date   TRIG 88.0 03/21/2022   Lab Results  Component Value Date   CHOLHDL 3 03/21/2022   Lab Results  Component Value Date   HGBA1C 5.8 03/21/2022       Assessment & Plan:   Problem List Items Addressed This Visit     Essential hypertension    Monitor and report any concerns, no changes to meds. Encouraged heart healthy diet such as the DASH diet and exercise as tolerated. She notes her Losartan was decreased from 100 to 50 mg because her blood pressure was running low and now it is still running low so we will drop her to 25 mg daily. reassess      Relevant Medications   losartan (COZAAR) 25 MG tablet   Other Relevant Orders   CBC   Comprehensive metabolic panel    Osteoporosis    Encouraged to get adequate exercise, calcium and vitamin d intake. Tolerating Prolia      Hyperlipidemia    Encourage heart healthy diet such as MIND or DASH diet, increase exercise, avoid trans fats, simple carbohydrates and processed foods, consider a krill or fish or flaxseed oil cap daily.       Relevant Medications   losartan (COZAAR) 25 MG tablet   Other Relevant Orders   Lipid panel   Statin intolerance    Unable to tolerate statins, will continue to monitor lipids      Recurrent falls    She fell in October 2022 and did not fall again until October of 2023. She has no broken bones and she is working with Dr Ramos to manage her flare in pain since the fall. The pain   is largely on the left side and she is managing. Has completed PT and may consider again in the future if falls become more recurrent.       Vitamin B12 deficiency    Supplement and monitor      Preventative health care    RSV (respiratory syncitial virus) vaccine at pharmacy, Arexvy Covid booster new version At pharmacy High dose flu shot       Hyperglycemia    hgba1c acceptable, minimize simple carbs. Increase exercise as tolerated.       Relevant Orders   Hemoglobin A1c   Hyperkalemia    Mild, asymptomatic, monitor      Anemia    Mild, asymptomatic, continue to monitor      RESOLVED: Falls    She notes she had a fall in October of 2022 and then she just had her next fall in October of 2023       I have discontinued Malva Diesing. Swiss's losartan and cephALEXin. I have also changed her gabapentin. Additionally, I am having her start on losartan. Lastly, I am having her maintain her acetaminophen, aspirin EC, pentosan polysulfate, denosumab, polyethylene glycol, fluticasone, Vitamin B-12, nystatin cream, albuterol, furosemide, hydroxychloroquine, nitroGLYCERIN, pantoprazole, amLODipine, Uribel, Repatha SureClick, atenolol, DULoxetine, DULoxetine, sucralfate, traZODone, fluticasone,  predniSONE, and benzonatate.  Meds ordered this encounter  Medications   losartan (COZAAR) 25 MG tablet    Sig: Take 1 tablet (25 mg total) by mouth daily.    Dispense:  90 tablet    Refill:  1    I discussed the assessment and treatment plan with the patient. The patient was provided an opportunity to ask questions and all were answered. The patient agreed with the plan and demonstrated an understanding of the instructions.   The patient was advised to call back or seek an in-person evaluation if the symptoms worsen or if the condition fails to improve as anticipated.    [name], MD [facility] [phone] (phone) [phone] (fax)  [place]

## 2022-09-29 NOTE — Assessment & Plan Note (Signed)
She notes she had a fall in October of 2022 and then she just had her next fall in October of 2023

## 2022-10-02 ENCOUNTER — Ambulatory Visit: Payer: Medicare Other | Attending: Surgery | Admitting: Physical Therapy

## 2022-10-02 ENCOUNTER — Encounter: Payer: Self-pay | Admitting: Physical Therapy

## 2022-10-02 DIAGNOSIS — R278 Other lack of coordination: Secondary | ICD-10-CM | POA: Diagnosis not present

## 2022-10-02 DIAGNOSIS — R1084 Generalized abdominal pain: Secondary | ICD-10-CM

## 2022-10-02 DIAGNOSIS — M6281 Muscle weakness (generalized): Secondary | ICD-10-CM | POA: Diagnosis not present

## 2022-10-02 NOTE — Therapy (Signed)
OUTPATIENT PHYSICAL THERAPY TREATMENT NOTE   Patient Name: [name] MRN: [id number] DOB:12/24/1946, 76 y.o., female Today's Date: 10/02/2022  PCP: Blyth, Stacey A, MD REFERRING PROVIDER: White, Christopher M, MD  END OF SESSION:   PT End of Session - 10/02/22 1136     Visit Number 9    Date for PT Re-Evaluation 11/27/22    Authorization Type [employer] medicare    Authorization - Visit Number 9    Authorization - Number of Visits 10    PT Start Time 1100    PT Stop Time 1138    PT Time Calculation (min) 38 min    Activity Tolerance Patient tolerated treatment well    Behavior During Therapy WFL for tasks assessed/performed             Past Medical History:  Diagnosis Date   Allergy    Anemia    Anxiety    past hx    Arthritis    Asthma    Atrial flutter (HCC)    past history- not current   Breast cancer (HCC)    CAD (coronary artery disease) CARDIOLOGIST--  DR MCALHANY   mild non-obstructive cad   Cancer (HCC)    right   Cataract    bilaterally removed    Chronic constipation    Chronic kidney disease    stage 3 ckd, interstitial cystitis   Concussion    x 3   COPD (chronic obstructive pulmonary disease) (HCC)    Depression    past hx    Family history of malignant hyperthermia    father had this   Fibromyalgia    Frequency of urination    GERD (gastroesophageal reflux disease)    H/O hiatal hernia    History of basal cell carcinoma excision    X2   History of breast cancer ONCOLOGIST-- DR MAGRINAT---  NO RECURRANCE   DX 07/2012;  LOW GRADE DCIS  ER+PR+  ----  S/P RIGHT LUMPECTOMY WITH NEGATIVE MARGINS/   RADIATION ENDED 11/2012   History of chronic bronchitis    History of colonic polyps    History of tachycardia    CONTROLLED  WITH ATENOLOL   Hyperlipidemia    Hypertension    Neuromuscular disorder (HCC)    fibromyalgia   Neuropathy    Osteoporosis 01/2019   T score -2.2 stable/improved from prior study   Pelvic pain    Personal history of  radiation therapy    S/P radiation therapy 11/12/12 - 12/05/12   right Breast   Sepsis (HCC) 2014   from UTI    Sinus headache    Sjogren's disease (HCC)    Urgency of urination    Past Surgical History:  Procedure Laterality Date   24 HOUR PH STUDY N/A 04/03/2016   Procedure: 24 HOUR PH STUDY;  Surgeon: Steven Paul Armbruster, MD;  Location: [facility];  Service: Gastroenterology;  Laterality: N/A;   BREAST BIOPSY Right 08/23/2012   ADH   BREAST BIOPSY Right 10/11/2012   Ductal Carcinoma   BREAST EXCISIONAL BIOPSY Right 09/04/2013   benign   BREAST LUMPECTOMY Right 10-11-2012   W/ SLN BX   CARDIAC CATHETERIZATION  09-13-2007  DR STUCKEY   WELL-PRESERVED LVF/  DIFFUSE SCATTERED CORONARY CALCIFACATION AND ATHEROSCLEROSIS WITHOUT OBSTRUCTION   CARDIAC CATHETERIZATION  08-04-2010  DR MCALHANY   NON-OBSTRUCTIVE CAD/  pLAD 40%/  oLAD 30%/  mLAD 30%/  pRCA 30%/  EF 60%   CARDIOVASCULAR STRESS TEST  06-18-2012  DR   McALHANY   LOW RISK NUCLEAR STUDY/  SMALL FIXED AREA OF MODERATELY DECREASED UPTAKE IN ANTEROSEPTAL WALL WHICH MAY BE ARTIFACTUAL/  NO ISCHEMIA/  EF 68%   COLONOSCOPY  09-29-2010   CYSTOSCOPY     CYSTOSCOPY WITH HYDRODISTENSION AND BIOPSY N/A 03/06/2014   Procedure: CYSTOSCOPY/HYDRODISTENSION/ INSTILATION OF MARCAINE AND PYRIDIUM;  Surgeon: Ailene Rud, MD;  Location: [facility];  Service: Urology;  Laterality: N/A;   ESOPHAGEAL MANOMETRY N/A 04/03/2016   Procedure: ESOPHAGEAL MANOMETRY (EM);  Surgeon: Manus Gunning, MD;  Location: [facility];  Service: Gastroenterology;  Laterality: N/A;   EXTRACORPOREAL SHOCK WAVE LITHOTRIPSY Left 02/06/2019   Procedure: EXTRACORPOREAL SHOCK WAVE LITHOTRIPSY (ESWL);  Surgeon: Kathie Rhodes, MD;  Location: [facility];  Service: Urology;  Laterality: Left;   NASAL SINUS SURGERY  1985   ORIF RIGHT ANKLE  FX  2006   POLYPECTOMY     REMOVAL VOCAL CORD CYST  FEB 2014   RIGHT BREAST BX  08-23-2012   RIGHT HAND  SURGERY  X3  LAST ONE 2009   INCLUDES  ORIF RIGHT 5TH FINGER AND REVISION TWICE   SKIN CANCER EXCISION     TONSILLECTOMY AND ADENOIDECTOMY  AGE 73   TOTAL ABDOMINAL HYSTERECTOMY W/ BILATERAL SALPINGOOPHORECTOMY  1982   W/  APPENDECTOMY   TRANSTHORACIC ECHOCARDIOGRAM  06-24-2012   GRADE I DIASTOLIC DYSFUNCTION/  EF 55-60%/  MILD MR   UPPER GASTROINTESTINAL ENDOSCOPY     Patient Active Problem List   Diagnosis Date Noted   Hyperkalemia 09/28/2022   Anemia 09/28/2022   Tremor 07/05/2022   RUQ pain 07/05/2022   Leukocytosis 07/05/2022   Chronic renal insufficiency 03/21/2022   Sjogren's disease ([place]) 12/12/2021   Vocal cord cyst 05/18/2021   High serum vitamin D 05/18/2021   Dysuria 03/09/2021   Vitamin B12 deficiency 03/08/2021   Preventative health care 03/08/2021   Hyperglycemia 03/08/2021   Low back pain 09/08/2020   OSA and COPD overlap syndrome ([place]) 06/10/2020   Recurrent falls 05/02/2020   Statin intolerance 02/29/2020   RLS (restless legs syndrome) 11/09/2019   Kidney stone 02/26/2019   Recurrent sinusitis 02/25/2019   Hx of colonic polyp 02/25/2019   DDD (degenerative disc disease), cervical 09/17/2018   Degenerative scoliosis 04/22/2018   Primary insomnia 06/25/2017   Chronic interstitial cystitis 02/01/2017   Chronic cough 01/09/2017   Right arm pain 07/20/2016   Deviated septum 05/12/2016   Nasal turbinate hypertrophy 05/12/2016   Dysphagia    Allergic rhinitis 01/25/2016   Other and combined forms of senile cataract 07/15/2015   Dyspnea    Left hip pain 04/30/2015   Sciatica 04/30/2015   Knee pain, right 04/30/2015   Bilateral carpal tunnel syndrome 03/04/2015   Hyperlipidemia 03/01/2015   Pulmonary nodules 02/12/2015   External hemorrhoids 02/05/2015   Chronic night sweats 01/08/2015   Atypical chest pain 01/01/2015   Renal insufficiency 07/16/2014   Memory loss 05/22/2014   Peripheral neuropathy 05/22/2014   Abdominal pain 10/26/2013   Headache  10/13/2013   Arthralgia 08/18/2013   ANA positive 07/29/2013   Depression 02/05/2013   Family history of malignant hyperthermia 02/05/2013   Malignant neoplasm of lower-outer quadrant of right breast of female, estrogen receptor positive ([name]) 01/03/2013   Splenic lesion 09/02/2012   Asthma, allergic 08/05/2012   GERD (gastroesophageal reflux disease) 06/03/2012   Edema 04/08/2012   Stricture and stenosis of esophagus 10/19/2011   Functional constipation 07/21/2011   Nonspecific (abnormal) findings on radiological and other examination of  biliary tract 07/21/2011   Osteoporosis 04/17/2011   Leg pain 02/24/2011   FIBROCYSTIC BREAST DISEASE, HX OF 10/18/2010   Essential hypertension 08/25/2010   CAD in native artery 08/25/2010   TOBACCO ABUSE, HX OF 08/25/2010   GENERALIZED ANXIETY DISORDER 08/03/2010   Fibromyalgia 01/11/2010   REFERRING DIAG: R15.9 (ICD-10-CM) - Full incontinence of feces   THERAPY DIAG:  Muscle weakness (generalized)   Other lack of coordination   Generalized abdominal pain   Rationale for Evaluation and Treatment Rehabilitation   ONSET DATE: 2019   SUBJECTIVE:                                                                                                                                                                                           SUBJECTIVE STATEMENT:  I fell onto my coccyx  3 weeks ago. I had trouble having a bowel movement  I only had 1 bowel movement in 2 weeks and I am passing good.   PAIN:  Are you having pain? Yes NPRS scale: 5/10 Pain location:  abdomen   Pain type: stabbing, hot Pain description: constant    Aggravating factors: when stomach is bloated, touching the abdomen Relieving factors: when completely empty from stool,  PAIN:  Are you having pain? Yes: NPRS scale: 6/10 Pain location: low back Pain description: intermittent Aggravating factors: movement ,standing up, supine to sitting Relieving factors: laying down      PRECAUTIONS: Other: Breast cancer with radiation   WEIGHT BEARING RESTRICTIONS No   FALLS:  Has patient fallen in last 6 months? No, cane is helping   LIVING ENVIRONMENT: Lives with: lives with their family   OCCUPATION: retired   PLOF: Independent   PATIENT GOALS reduce leakage and abdominal pain, increase strength of anal sphincter   PERTINENT HISTORY:  Breast cancer 07/2012 with radiation 11/12/2012-12.12.2013, CAD (coronary artery disease); Chronic kidney disease stage 3; Fibromyalgia; Sjogren's disease ; Osteoporosis; TOTAL ABDOMINAL HYSTERECTOMY W/ BILATERAL SALPINGOOPHORECTOMY 1982     BOWEL MOVEMENT Pain with bowel movement: Yes, due to hemorrhoids that bleed and are painful Type of bowel movement:Type (Bristol Stool Scale) Type 1, 4, 5, 7, Frequency 1-5 days, and Strain no by holding abdomen and leaning forward.  Fully empty rectum: No Leakage: Yes: 1 accident per week , gets fecal urgency Pads: None Fiber supplement: Yes: miralax   URINATION Pain with urination: Yes, due to the UTI Fully empty bladder: No,  Stream: Strong and Weak Urgency: yes when she is out  Frequency: urinate every 2-3 hours Leakage: just in the morning if she does not go first thing in the morning.  Pads: Yes: 1   depends per day   INTERCOURSE Pain with intercourse: Initial Penetration, uses moisturizers around the vaginal canal Ability to have vaginal penetration:  No Climax: has not had a climax for a long time Marinoff Scale: 3/3   PREGNANCY Vaginal deliveries 3 Tearing episiotomy 3 times   PROLAPSE None       OBJECTIVE:      PATIENT SURVEYS:  PFIQ-7 43, UIQ-7 0, CRAIQ-7 14, POPIQ-7 29   COGNITION:            Overall cognitive status: Within functional limits for tasks assessed                              GAIT: Distance walked: walks with a cane slowly Assistive device utilized: Single point cane Level of assistance: Complete Independence                POSTURE:  decreased lumbar lordosis, scoliosis, left ilium higher than right               PELVIC ALIGNMENT:   LUMBARAROM/PROM   A/PROM A/PROM  eval  Flexion Decreased by 50%  Extension Decreased by 50%  Right lateral flexion Decreased by 50%  Left lateral flexion Decreased by 50%  Right rotation Decreased by 50%  Left rotation Decreased by 50%   (Blank rows = not tested)   LOWER EXTREMITY ROM: bilateral hip ROM is full.      LOWER EXTREMITY MMT:   MMT Right eval Left eval Right 08/09/2022 Left 08/09/2022 Right 08/30/2022 Left  08/30/2022  Hip flexion 4/5 4/5 4/5 4/5 4+/5 4+/5  Hip extension 4/5 4/5 4/5 4/5 4/5 4/5  Hip abduction 4/5 4/5 4/5 4/5 4/5 4/5     PALPATION:   General  tenderness throughout the abdomen; 10/02/2022 ASIS are equal                 External Perineal Exam labia minora is fused to the labia majora,                              Internal Pelvic Floor redness located on the bottom half of the urethra, tenderness located on levator ani and obturator internist, along the right side of the introitus.    Patient confirms identification and approves PT to assess internal pelvic floor and treatment Yes   PELVIC MMT:   MMT eval 08/16/2022 08/23/2022  Vaginal 3/5   3/5   Internal Anal Sphincter 2/5 3/5 3/5  External Anal Sphincter 2/5 3/5 3/5  Puborectalis 2/5 3/5 3/5  (Blank rows = not tested) No emotional/communication barriers or cognitive limitation. Patient is motivated to learn. Patient understands and agrees with treatment goals and plan. PT explains patient will be examined in standing, sitting, and lying down to see how their muscles and joints work. When they are ready, they will be asked to remove their underwear so PT can examine their perineum. The patient is also given the option of providing their own chaperone as one is not provided in our facility. The patient also has the right and is explained the right to defer or refuse any part of the evaluation or  treatment including the internal exam. With the patient's consent, PT will use one gloved finger to gently assess the muscles of the pelvic floor, seeing how well it contracts and relaxes and if there is muscle symmetry. After, the patient will   get dressed and PT and patient will discuss exam findings and plan of care. PT and patient discuss plan of care, schedule, attendance policy and HEP activities.         TONE: Increased tone vaginally   PROLAPSE: none   TODAY'S TREATMENT  10/02/2022 Manual: Soft tissue mobilization:circular motion of the abdomen to  promote peristalic motions Manual work to the pelvic floor in sidely  through her pants  Scar tissue mobilization:scar work to the lower abdomen Myofascial release:using the suction cup to release the abdominal tissue Tissue rolling of the abdomen Release of the mesenteric root Release of the intestines off the bladder    08/30/2022 Exercises: Stretches/mobility: Strengthening:supine ball squeeze contract the lower abdominal and pelvic floor 15x Hooklying Isometric Hip Flexion  hold 5 sec 10x each side with abdominal contraction and pelvic floor contraction.  Hookly press arms and trunk into mat with abdominal and anal contraction hold 5 sec 10x Hookly squeeze ball between hands contraction the anus and abdomen with shoulder flexion 15x    08/23/2022 Manual: Internal pelvic floor techniques:No emotional/communication barriers or cognitive limitation. Patient is motivated to learn. Patient understands and agrees with treatment goals and plan. PT explains patient will be examined in standing, sitting, and lying down to see how their muscles and joints work. When they are ready, they will be asked to remove their underwear so PT can examine their perineum. The patient is also given the option of providing their own chaperone as one is not provided in our facility. The patient also has the right and is explained the right to defer or refuse  any part of the evaluation or treatment including the internal exam. With the patient's consent, PT will use one gloved finger to gently assess the muscles of the pelvic floor, seeing how well it contracts and relaxes and if there is muscle symmetry. After, the patient will get dressed and PT and patient will discuss exam findings and plan of care. PT and patient discuss plan of care, schedule, attendance policy and HEP activities.                     Going through the anus working on the left internal sphincter and anterior wall of the rectum, manual work on the puborectalis to improve the forward motion                    Tactile cues to contract the anus in a circular motion and lift                    Manual work to the perineal body internally and externally to improve the anterior motion of the rectum Neuromuscular re-education: Core retraining:hip flexion isometric holding 5 seconds with pelvic floor contraction 5x each side Hip adduction with ball squeeze holding 5 sec with anal contraction 10x Sitting anal contraction holding for 5 sec 10x Sit to stand with pelvic floor contraction 5x without holding on Pelvic floor contraction training:tactile cues to the rectum to improve contraction    PATIENT EDUCATION:  08/16/2022 Education details:  Access Code: [email or web address] Person educated: Patient Education method: Explanation, Demonstration, Tactile cues, Verbal cues, and Handouts Education comprehension: verbalized understanding, returned demonstration, verbal cues required, tactile cues required, and needs further education     HOME EXERCISE PROGRAM: 08/16/2022  Access Code: [email or web address] URL: [email or web address] Date: 08/16/2022 Prepared by: Cheryl Gray   Exercises - Hooklying Isometric Hip Flexion  - 1 x daily - 7   x weekly - 5 sets - 10 reps - 5 sec hold - Supine Hip Adduction Isometric with Ball  - 1 x daily - 7 x weekly - 1 sets - 10 reps - Seated Pelvic Floor Contraction  -  3 x daily - 7 x weekly - 1 sets - 10 reps - 10 sec hold - Seated Quick Flick Pelvic Floor Contractions  - 3 x daily - 7 x weekly - 1 sets - 5 reps ASSESSMENT:   CLINICAL IMPRESSION: Patient is a 75 y.o. female who was seen today for physical therapy  treatment for fecal incontinence. Patient fell on her coccyx on the stairs. Since then she has only had 1 bowel movement in 2.5 weeks She has trouble with coccyx pain. Her pelvis in correct alignment. Patient was able to pass gas after therapy. She had increased mobility of the abdomen after therapy. . Patient will benefit from skilled therapy to improve pelvic floor coordination, reduce pain and improve leakage.      OBJECTIVE IMPAIRMENTS decreased activity tolerance, decreased coordination, decreased endurance, decreased strength, increased fascial restrictions, increased muscle spasms, impaired tone, and pain.    ACTIVITY LIMITATIONS lifting, bending, continence, and toileting   PARTICIPATION LIMITATIONS: cleaning, interpersonal relationship, driving, and community activity   PERSONAL FACTORS Breast cancer 07/2012 with radiation 11/12/2012-12.12.2013, CAD (coronary artery disease); Chronic kidney disease stage 3; Fibromyalgia; Sjogren's disease ; Osteoporosis; TOTAL ABDOMINAL HYSTERECTOMY W/ BILATERAL SALPINGOOPHORECTOMY 1982 are also affecting patient's functional outcome.    REHAB POTENTIAL: Excellent   CLINICAL DECISION MAKING: Evolving/moderate complexity   EVALUATION COMPLEXITY: Moderate     GOALS: Goals reviewed with patient? Yes   SHORT TERM GOALS: Target date: 07/10/2022   Patient educated on vaginal moisturizers and lubricant to improve vaginal health.  Baseline: Goal status: met 07/05/2022   2.  Patient independent with initial HEP for pelvic floor relaxation, contraction and abdominal work.  Baseline:  Goal status: met 07/28/2022   3.  Patient reports her abdominal pain is </= 25%.  Baseline:  Goal status: Met 07/12/2022     LONG TERM GOALS: Target date: 09/04/2022    Patient independent with advanced HEP for pelvic floor, core, and hip strength.  Baseline:  Goal status:ongoing 07/28/2022  2.  Patient reports her urinary leakage has improved >/= 75% due to improved strength and reduction of pain.  Baseline: no leakage Goal status: met 07/28/2022   3.  Patient reports her fecal leakage has improved >/= 75% due to increased in rectal strength.  Baseline: improved 50%.  Goal status: ongoing 07/28/2022   4.  Patient reports her abdominal pain has improved >/= 50%.  Baseline:  Goal status: met 08/24/3031   5.  Patient is able to have penile penetration with 50% less pain due to improve vaginal moisture and reduction of trigger points in the pelvic floor.  Baseline:  Goal status: ongoing 07/28/2022     PLAN: PT FREQUENCY: 1x/week   PT DURATION: 12 weeks   PLANNED INTERVENTIONS: Therapeutic exercises, Therapeutic activity, Neuromuscular re-education, Patient/Family education, Joint mobilization, Electrical stimulation, Cryotherapy, Moist heat, Biofeedback, and Manual therapy   PLAN FOR NEXT SESSION:  manual work to abdomen and education on abdominal massage, work on abdominal contraction and rectal contraction; nustep; write 10 th note  Cheryl Gray, PT 10/02/22 11:39 AM      

## 2022-10-04 DIAGNOSIS — R102 Pelvic and perineal pain: Secondary | ICD-10-CM | POA: Diagnosis not present

## 2022-10-09 ENCOUNTER — Telehealth: Payer: Self-pay | Admitting: Cardiology

## 2022-10-09 ENCOUNTER — Ambulatory Visit: Payer: Medicare Other | Admitting: Physical Therapy

## 2022-10-09 NOTE — Telephone Encounter (Signed)
,  Pt c/o BP issue: STAT if pt c/o blurred vision, one-sided weakness or slurred speech  1. What are your last 5 BP readings? 90/60  2. Are you having any other symptoms (ex. Dizziness, headache, blurred vision, passed out)? Dizziness. Fatigue,  3. What is your BP issue?  Bp is running low

## 2022-10-09 NOTE — Telephone Encounter (Signed)
Left a message for the patient to call back.  

## 2022-10-10 DIAGNOSIS — M4180 Other forms of scoliosis, site unspecified: Secondary | ICD-10-CM | POA: Diagnosis not present

## 2022-10-10 DIAGNOSIS — M545 Low back pain, unspecified: Secondary | ICD-10-CM | POA: Diagnosis not present

## 2022-10-10 DIAGNOSIS — M5416 Radiculopathy, lumbar region: Secondary | ICD-10-CM | POA: Diagnosis not present

## 2022-10-10 DIAGNOSIS — R296 Repeated falls: Secondary | ICD-10-CM | POA: Diagnosis not present

## 2022-10-11 ENCOUNTER — Telehealth: Payer: Self-pay

## 2022-10-11 ENCOUNTER — Telehealth: Payer: Self-pay | Admitting: Family Medicine

## 2022-10-11 DIAGNOSIS — R0789 Other chest pain: Secondary | ICD-10-CM | POA: Diagnosis not present

## 2022-10-11 DIAGNOSIS — Z87891 Personal history of nicotine dependence: Secondary | ICD-10-CM | POA: Diagnosis not present

## 2022-10-11 DIAGNOSIS — F419 Anxiety disorder, unspecified: Secondary | ICD-10-CM | POA: Diagnosis not present

## 2022-10-11 DIAGNOSIS — H52223 Regular astigmatism, bilateral: Secondary | ICD-10-CM | POA: Diagnosis not present

## 2022-10-11 DIAGNOSIS — R059 Cough, unspecified: Secondary | ICD-10-CM | POA: Diagnosis not present

## 2022-10-11 NOTE — Telephone Encounter (Signed)
Nurse Assessment Nurse: Cherre Robins, RN, Ria Comment Date/Time (Eastern Time): 10/11/2022 2:30:15 PM Confirm and document reason for call. If symptomatic, describe symptoms. ---Caller states she's feeling heart fluttering. Sx started today around 11am/12pm. Also c/o left side chest "buzzing" or "electrical" feeling. Does the patient have any new or worsening symptoms? ---Yes Will a triage be completed? ---Yes Related visit to physician within the last 2 weeks? ---No Does the PT have any chronic conditions? (i.e. diabetes, asthma, this includes High risk factors for pregnancy, etc.) ---Yes List chronic conditions. ---asthma, diabetes, hx of breast cancer, CAD Is this a behavioral health or substance abuse call? ---No Guidelines Guideline Title Affirmed Question Affirmed Notes Nurse Date/Time (Eastern Time) Chest Pain Heart beating < 50 beats per minute OR > 140 beats per minute Weiss-Hilton, RN, Ria Comment 10/11/2022 2:33:01 PM Disp. Time Eilene Ghazi Time) Disposition Final User 10/11/2022 2:37:22 PM Call EMS 911 Now Yes Weiss-Hilton, RN, Ria Comment 10/11/2022 2:43:05 PM 911 Outcome Documentation Weiss-Hilton, RN, Ria Comment PLEASE NOTE: All timestamps contained within this report are represented as [place] Standard Time. CONFIDENTIALTY NOTICE: This fax transmission is intended only for the addressee. It contains information that is legally privileged, confidential or otherwise protected from use or disclosure. If you are not the intended recipient, you are strictly prohibited from reviewing, disclosing, copying using or disseminating any of this information or taking any action in reliance on or regarding this information. If you have received this fax in error, please notify us immediately by telephone so that we can arrange for its return to [place]. Phone: [phone], Toll-Free: [phone], Fax: [phone] Page: 2 of 2 Call Id: [id number] [place]. Time (Eastern Time) Disposition Final  User Reason: Call rang and went to voicemail. Final Disposition 10/11/2022 2:37:22 PM Call EMS 911 Now Yes Weiss-Hilton, RN, Ivonne Andrew Disagree/Comply Comply Caller Understands Yes PreDisposition InappropriateToAsk Care Advice Given Per Guideline CALL EMS 911 NOW: * Immediate medical attention is needed. You need to hang up and call 911 (or an ambulance). * Triager Discretion: I'll call you back in a few minutes to be sure you were able to reach them. CARE ADVICE given per Chest Pain (Adult) guideline. Comments User: Carolan Clines, RN Date/Time Eilene Ghazi Time): 10/11/2022 2:37:21 PM States current pulse is 45

## 2022-10-11 NOTE — Telephone Encounter (Signed)
Pt called stating she was experiencing the following symptoms:  -Heart Flutter - similar to slight shock   -Lower BP over the last couple of weeks - as low as 80/50  -No Headaches  -Tired overall  Pt was transferred to triage nurse for further eval.

## 2022-10-11 NOTE — Telephone Encounter (Signed)
Pt called back said she went to ER because of something going  With her heart and chest. Pt been seen right now at  ER.

## 2022-10-11 NOTE — Telephone Encounter (Signed)
Tried calling pt back keep going to [facility] lvm to go ER if not feel better

## 2022-10-11 NOTE — Telephone Encounter (Signed)
Called pt back regarding her experiencing the following symptoms of low Bp concern. After reviewing pt chart her Cardiology call Lvm regarding Bp. I spoke to DOD was advised pt needs to follow back up with the cardiology. Lvm for pt to call us back and if feeling worsen go to ER.

## 2022-10-12 NOTE — Telephone Encounter (Signed)
LMTCB

## 2022-10-13 NOTE — Telephone Encounter (Signed)
Patient has been seen in the ER 

## 2022-10-16 ENCOUNTER — Ambulatory Visit: Payer: Medicare Other | Admitting: Physical Therapy

## 2022-10-16 ENCOUNTER — Encounter: Payer: Self-pay | Admitting: Physical Therapy

## 2022-10-16 DIAGNOSIS — R1084 Generalized abdominal pain: Secondary | ICD-10-CM | POA: Diagnosis not present

## 2022-10-16 DIAGNOSIS — R278 Other lack of coordination: Secondary | ICD-10-CM | POA: Diagnosis not present

## 2022-10-16 DIAGNOSIS — M6281 Muscle weakness (generalized): Secondary | ICD-10-CM

## 2022-10-16 NOTE — Therapy (Signed)
OUTPATIENT PHYSICAL THERAPY TREATMENT NOTE  Progress Note Reporting Period 6/19 to 10/16/2022  See note below for Objective Data and Assessment of Progress/Goals.      Patient Name: [name] MRN: [id number] DOB:02/07/1946, 76 y.o., female Today's Date: 10/16/2022  PCP: Blyth, Stacey A, MD REFERRING PROVIDER: White, Christopher M, MD  END OF SESSION:   PT End of Session - 10/16/22 1113     Visit Number 10    Date for PT Re-Evaluation 11/27/22    Authorization Type [employer] medicare    Authorization - Visit Number 10    Authorization - Number of Visits 10    PT Start Time 1113   came late   PT Stop Time 1140    PT Time Calculation (min) 27 min    Activity Tolerance Patient tolerated treatment well    Behavior During Therapy WFL for tasks assessed/performed             Past Medical History:  Diagnosis Date   Allergy    Anemia    Anxiety    past hx    Arthritis    Asthma    Atrial flutter (HCC)    past history- not current   Breast cancer (HCC)    CAD (coronary artery disease) CARDIOLOGIST--  DR MCALHANY   mild non-obstructive cad   Cancer (HCC)    right   Cataract    bilaterally removed    Chronic constipation    Chronic kidney disease    stage 3 ckd, interstitial cystitis   Concussion    x 3   COPD (chronic obstructive pulmonary disease) (HCC)    Depression    past hx    Family history of malignant hyperthermia    father had this   Fibromyalgia    Frequency of urination    GERD (gastroesophageal reflux disease)    H/O hiatal hernia    History of basal cell carcinoma excision    X2   History of breast cancer ONCOLOGIST-- DR MAGRINAT---  NO RECURRANCE   DX 07/2012;  LOW GRADE DCIS  ER+PR+  ----  S/P RIGHT LUMPECTOMY WITH NEGATIVE MARGINS/   RADIATION ENDED 11/2012   History of chronic bronchitis    History of colonic polyps    History of tachycardia    CONTROLLED  WITH ATENOLOL   Hyperlipidemia    Hypertension    Neuromuscular disorder (HCC)     fibromyalgia   Neuropathy    Osteoporosis 01/2019   T score -2.2 stable/improved from prior study   Pelvic pain    Personal history of radiation therapy    S/P radiation therapy 11/12/12 - 12/05/12   right Breast   Sepsis (HCC) 2014   from UTI    Sinus headache    Sjogren's disease (HCC)    Urgency of urination    Past Surgical History:  Procedure Laterality Date   24 HOUR PH STUDY N/A 04/03/2016   Procedure: 24 HOUR PH STUDY;  Surgeon: Steven Paul Armbruster, MD;  Location: [facility];  Service: Gastroenterology;  Laterality: N/A;   BREAST BIOPSY Right 08/23/2012   ADH   BREAST BIOPSY Right 10/11/2012   Ductal Carcinoma   BREAST EXCISIONAL BIOPSY Right 09/04/2013   benign   BREAST LUMPECTOMY Right 10-11-2012   W/ SLN BX   CARDIAC CATHETERIZATION  09-13-2007  DR STUCKEY   WELL-PRESERVED LVF/  DIFFUSE SCATTERED CORONARY CALCIFACATION AND ATHEROSCLEROSIS WITHOUT OBSTRUCTION   CARDIAC CATHETERIZATION  08-04-2010  DR MCALHANY   NON-OBSTRUCTIVE   CAD/  pLAD 40%/  oLAD 30%/  mLAD 30%/  pRCA 30%/  EF 60%   CARDIOVASCULAR STRESS TEST  06-18-2012  DR McALHANY   LOW RISK NUCLEAR STUDY/  SMALL FIXED AREA OF MODERATELY DECREASED UPTAKE IN ANTEROSEPTAL WALL WHICH MAY BE ARTIFACTUAL/  NO ISCHEMIA/  EF 68%   COLONOSCOPY  09-29-2010   CYSTOSCOPY     CYSTOSCOPY WITH HYDRODISTENSION AND BIOPSY N/A 03/06/2014   Procedure: CYSTOSCOPY/HYDRODISTENSION/ INSTILATION OF MARCAINE AND PYRIDIUM;  Surgeon: Sigmund I Tannenbaum, MD;  Location: [facility];  Service: Urology;  Laterality: N/A;   ESOPHAGEAL MANOMETRY N/A 04/03/2016   Procedure: ESOPHAGEAL MANOMETRY (EM);  Surgeon: Steven Paul Armbruster, MD;  Location: [facility];  Service: Gastroenterology;  Laterality: N/A;   EXTRACORPOREAL SHOCK WAVE LITHOTRIPSY Left 02/06/2019   Procedure: EXTRACORPOREAL SHOCK WAVE LITHOTRIPSY (ESWL);  Surgeon: Ottelin, Mark, MD;  Location: [facility];  Service: Urology;  Laterality: Left;   NASAL SINUS  SURGERY  1985   ORIF RIGHT ANKLE  FX  2006   POLYPECTOMY     REMOVAL VOCAL CORD CYST  FEB 2014   RIGHT BREAST BX  08-23-2012   RIGHT HAND SURGERY  X3  LAST ONE 2009   INCLUDES  ORIF RIGHT 5TH FINGER AND REVISION TWICE   SKIN CANCER EXCISION     TONSILLECTOMY AND ADENOIDECTOMY  AGE 5   TOTAL ABDOMINAL HYSTERECTOMY W/ BILATERAL SALPINGOOPHORECTOMY  1982   W/  APPENDECTOMY   TRANSTHORACIC ECHOCARDIOGRAM  06-24-2012   GRADE I DIASTOLIC DYSFUNCTION/  EF 55-60%/  MILD MR   UPPER GASTROINTESTINAL ENDOSCOPY     Patient Active Problem List   Diagnosis Date Noted   Hyperkalemia 09/28/2022   Anemia 09/28/2022   Tremor 07/05/2022   RUQ pain 07/05/2022   Leukocytosis 07/05/2022   Chronic renal insufficiency 03/21/2022   Sjogren's disease (HCC) 12/12/2021   Vocal cord cyst 05/18/2021   High serum vitamin D 05/18/2021   Dysuria 03/09/2021   Vitamin B12 deficiency 03/08/2021   Preventative health care 03/08/2021   Hyperglycemia 03/08/2021   Low back pain 09/08/2020   OSA and COPD overlap syndrome (HCC) 06/10/2020   Recurrent falls 05/02/2020   Statin intolerance 02/29/2020   RLS (restless legs syndrome) 11/09/2019   Kidney stone 02/26/2019   Recurrent sinusitis 02/25/2019   Hx of colonic polyp 02/25/2019   DDD (degenerative disc disease), cervical 09/17/2018   Degenerative scoliosis 04/22/2018   Primary insomnia 06/25/2017   Chronic interstitial cystitis 02/01/2017   Chronic cough 01/09/2017   Right arm pain 07/20/2016   Deviated septum 05/12/2016   Nasal turbinate hypertrophy 05/12/2016   Dysphagia    Allergic rhinitis 01/25/2016   Other and combined forms of senile cataract 07/15/2015   Dyspnea    Left hip pain 04/30/2015   Sciatica 04/30/2015   Knee pain, right 04/30/2015   Bilateral carpal tunnel syndrome 03/04/2015   Hyperlipidemia 03/01/2015   Pulmonary nodules 02/12/2015   External hemorrhoids 02/05/2015   Chronic night sweats 01/08/2015   Atypical chest pain  01/01/2015   Renal insufficiency 07/16/2014   Memory loss 05/22/2014   Peripheral neuropathy 05/22/2014   Abdominal pain 10/26/2013   Headache 10/13/2013   Arthralgia 08/18/2013   ANA positive 07/29/2013   Depression 02/05/2013   Family history of malignant hyperthermia 02/05/2013   Malignant neoplasm of lower-outer quadrant of right breast of female, estrogen receptor positive (HCC) 01/03/2013   Splenic lesion 09/02/2012   Asthma, allergic 08/05/2012   GERD (gastroesophageal reflux disease) 06/03/2012   Edema   04/08/2012   Stricture and stenosis of esophagus 10/19/2011   Functional constipation 07/21/2011   Nonspecific (abnormal) findings on radiological and other examination of biliary tract 07/21/2011   Osteoporosis 04/17/2011   Leg pain 02/24/2011   FIBROCYSTIC BREAST DISEASE, HX OF 10/18/2010   Essential hypertension 08/25/2010   CAD in native artery 08/25/2010   TOBACCO ABUSE, HX OF 08/25/2010   GENERALIZED ANXIETY DISORDER 08/03/2010   Fibromyalgia 01/11/2010   REFERRING DIAG: R15.9 (ICD-10-CM) - Full incontinence of feces   THERAPY DIAG:  Muscle weakness (generalized)   Other lack of coordination   Generalized abdominal pain   Rationale for Evaluation and Treatment Rehabilitation   ONSET DATE: 2019   SUBJECTIVE:                                                                                                                                                                                           SUBJECTIVE STATEMENT: Used the smooth move tea and it helped me have a bowel movement and last for 2 days. I have had a little stool coming out and did not feel it come out.    PAIN:  Are you having pain? Yes NPRS scale: 5/10 Pain location:  abdomen   Pain type: stabbing, hot Pain description: constant    Aggravating factors: when stomach is bloated, touching the abdomen Relieving factors: when completely empty from stool,  PAIN:  Are you having pain? Yes: NPRS  scale: 6/10 Pain location: low back Pain description: intermittent Aggravating factors: movement ,standing up, supine to sitting Relieving factors: laying down     PRECAUTIONS: Other: Breast cancer with radiation   WEIGHT BEARING RESTRICTIONS No   FALLS:  Has patient fallen in last 6 months? No, cane is helping   LIVING ENVIRONMENT: Lives with: lives with their family   OCCUPATION: retired   PLOF: Independent   PATIENT GOALS reduce leakage and abdominal pain, increase strength of anal sphincter   PERTINENT HISTORY:  Breast cancer 07/2012 with radiation 11/12/2012-12.12.2013, CAD (coronary artery disease); Chronic kidney disease stage 3; Fibromyalgia; Sjogren's disease ; Osteoporosis; TOTAL ABDOMINAL HYSTERECTOMY W/ BILATERAL SALPINGOOPHORECTOMY 1982     BOWEL MOVEMENT Pain with bowel movement: Yes, due to hemorrhoids that bleed and are painful Type of bowel movement:Type (Bristol Stool Scale) Type 1, 4, 5, 7, Frequency 1-5 days, and Strain no by holding abdomen and leaning forward.  Fully empty rectum: No Leakage: Yes: 1 accident per week , gets fecal urgency Pads: None Fiber supplement: Yes: miralax   URINATION Pain with urination: Yes, due to the UTI Fully empty bladder: No,  Stream: Strong and Weak Urgency: yes when she  is out  Frequency: urinate every 2-3 hours Leakage: just in the morning if she does not go first thing in the morning.  Pads: Yes: 1 depends per day   INTERCOURSE Pain with intercourse: Initial Penetration, uses moisturizers around the vaginal canal Ability to have vaginal penetration:  No Climax: has not had a climax for a long time Marinoff Scale: 3/3   PREGNANCY Vaginal deliveries 3 Tearing episiotomy 3 times   PROLAPSE None       OBJECTIVE:      PATIENT SURVEYS:  PFIQ-7 43, UIQ-7 0, CRAIQ-7 14, POPIQ-7 29 10/16/2022  PFIQ-7 28, UIQ-7 0, CRAIQ-7 29, POPIQ-7 0   COGNITION:            Overall cognitive status: Within functional  limits for tasks assessed                              GAIT: Distance walked: walks with a cane slowly Assistive device utilized: Single point cane Level of assistance: Complete Independence                POSTURE: decreased lumbar lordosis, scoliosis, left ilium higher than right               PELVIC ALIGNMENT:   LUMBARAROM/PROM   A/PROM A/PROM  eval  Flexion Decreased by 50%  Extension Decreased by 50%  Right lateral flexion Decreased by 50%  Left lateral flexion Decreased by 50%  Right rotation Decreased by 50%  Left rotation Decreased by 50%   (Blank rows = not tested)   LOWER EXTREMITY ROM: bilateral hip ROM is full.      LOWER EXTREMITY MMT:   MMT Right eval Left eval Right 08/09/2022 Left 08/09/2022 Right 08/30/2022 Left  08/30/2022 Right and left 10/16/2022  Hip flexion 4/5 4/5 4/5 4/5 4+/5 4+/5 4+/5  Hip extension 4/5 4/5 4/5 4/5 4/5 4/5 4/5  Hip abduction 4/5 4/5 4/5 4/5 4/5 4/5 4/5     PALPATION:   General  tenderness throughout the abdomen; 10/02/2022 ASIS are equal                 External Perineal Exam labia minora is fused to the labia majora,                              Internal Pelvic Floor redness located on the bottom half of the urethra, tenderness located on levator ani and obturator internist, along the right side of the introitus.    Patient confirms identification and approves PT to assess internal pelvic floor and treatment Yes   PELVIC MMT:   MMT eval 08/16/2022 08/23/2022 10/16/2022  Vaginal 3/5   3/5  3/5  Internal Anal Sphincter 2/5 3/5 3/5 3/5  External Anal Sphincter 2/5 3/5 3/5 3/5  Puborectalis 2/5 3/5 3/5 3/5  (Blank rows = not tested) No emotional/communication barriers or cognitive limitation. Patient is motivated to learn. Patient understands and agrees with treatment goals and plan. PT explains patient will be examined in standing, sitting, and lying down to see how their muscles and joints work. When they are ready, they will be  asked to remove their underwear so PT can examine their perineum. The patient is also given the option of providing their own chaperone as one is not provided in our facility. The patient also has the right and is explained the right to defer   or refuse any part of the evaluation or treatment including the internal exam. With the patient's consent, PT will use one gloved finger to gently assess the muscles of the pelvic floor, seeing how well it contracts and relaxes and if there is muscle symmetry. After, the patient will get dressed and PT and patient will discuss exam findings and plan of care. PT and patient discuss plan of care, schedule, attendance policy and HEP activities.         TONE: Increased tone vaginally   PROLAPSE: none   TODAY'S TREATMENT  10/16/2022 Exercises: Strengthening:supine ball squeeze contract the lower abdominal and pelvic floor 15x Hooklying Isometric Hip Flexion  hold 5 sec 10x each side with abdominal contraction and pelvic floor contraction.  Hookly press arms and trunk into mat with abdominal and anal contraction hold 5 sec 10x Hookly squeeze ball between hands contraction the anus and abdomen with shoulder flexion 15x    10/02/2022 Manual: Soft tissue mobilization:circular motion of the abdomen to  promote peristalic motions Manual work to the pelvic floor in sidely  through her pants  Scar tissue mobilization:scar work to the lower abdomen Myofascial release:using the suction cup to release the abdominal tissue Tissue rolling of the abdomen Release of the mesenteric root Release of the intestines off the bladder    08/30/2022 Exercises: Stretches/mobility: Strengthening:supine ball squeeze contract the lower abdominal and pelvic floor 15x Hooklying Isometric Hip Flexion  hold 5 sec 10x each side with abdominal contraction and pelvic floor contraction.  Hookly press arms and trunk into mat with abdominal and anal contraction hold 5 sec 10x Hookly  squeeze ball between hands contraction the anus and abdomen with shoulder flexion 15x       PATIENT EDUCATION:  08/16/2022 Education details:  Access Code: [email or web address] Person educated: Patient Education method: Explanation, Demonstration, Tactile cues, Verbal cues, and Handouts Education comprehension: verbalized understanding, returned demonstration, verbal cues required, tactile cues required, and needs further education     HOME EXERCISE PROGRAM: 08/16/2022  Access Code: [facility] URL: [email or web address] Date: 08/16/2022 Prepared by: Earlie Counts   Exercises - Hooklying Isometric Hip Flexion  - 1 x daily - 7 x weekly - 5 sets - 10 reps - 5 sec hold - Supine Hip Adduction Isometric with Ball  - 1 x daily - 7 x weekly - 1 sets - 10 reps - Seated Pelvic Floor Contraction  - 3 x daily - 7 x weekly - 1 sets - 10 reps - 10 sec hold - Seated Quick Flick Pelvic Floor Contractions  - 3 x daily - 7 x weekly - 1 sets - 5 reps ASSESSMENT:   CLINICAL IMPRESSION: Patient is a 76 y.o. female who was seen today for physical therapy  treatment for fecal incontinence. Patient has improved her pelvic floor strength. Patient is not having urinary leakage. Her stool leakage is 75% better. Patient has her coccyx checked out found out her scoliosis is worse and spine is bone on bone.  Patient is able to have penile penetration vaginally with pain on deep penetration. Patient has to move slowly due to back pain. Patient has improved functional score for PFIQ-7  and reduced from 43 to 28.    OBJECTIVE IMPAIRMENTS decreased activity tolerance, decreased coordination, decreased endurance, decreased strength, increased fascial restrictions, increased muscle spasms, impaired tone, and pain.    ACTIVITY LIMITATIONS lifting, bending, continence, and toileting   PARTICIPATION LIMITATIONS: cleaning, interpersonal relationship, driving, and community activity   PERSONAL FACTORS Breast cancer 07/2012  with  radiation 11/12/2012-12.12.2013, CAD (coronary artery disease); Chronic kidney disease stage 3; Fibromyalgia; Sjogren's disease ; Osteoporosis; TOTAL ABDOMINAL HYSTERECTOMY W/ BILATERAL SALPINGOOPHORECTOMY 1982 are also affecting patient's functional outcome.    REHAB POTENTIAL: Excellent   CLINICAL DECISION MAKING: Evolving/moderate complexity   EVALUATION COMPLEXITY: Moderate     GOALS: Goals reviewed with patient? Yes   SHORT TERM GOALS: Target date: 07/10/2022   Patient educated on vaginal moisturizers and lubricant to improve vaginal health.  Baseline: Goal status: met 07/05/2022   2.  Patient independent with initial HEP for pelvic floor relaxation, contraction and abdominal work.  Baseline:  Goal status: met 07/28/2022   3.  Patient reports her abdominal pain is </= 25%.  Baseline:  Goal status: Met 07/12/2022    LONG TERM GOALS: Target date: 09/04/2022    Patient independent with advanced HEP for pelvic floor, core, and hip strength.  Baseline:  Goal status:Met 10/16/2022        2.  Patient reports her urinary leakage has improved >/= 75% due to improved strength and reduction of pain.  Baseline: no leakage Goal status: met 07/28/2022   3.  Patient reports her fecal leakage has improved >/= 75% due to increased in rectal strength.  Baseline: improved 50%.  Goal status: Met 10/16/2022   4.  Patient reports her abdominal pain has improved >/= 50%.  Baseline:  Goal status: met 08/24/3031   5.  Patient is able to have penile penetration with 50% less pain due to improve vaginal moisture and reduction of trigger points in the pelvic floor.  Baseline:  Goal status: Met 10/16/2022     PLAN: PLAN FOR NEXT SESSION:  Discharge to [facility], PT 10/16/22 11:45 AM   PHYSICAL THERAPY DISCHARGE SUMMARY  Visits from Start of Care: 10  Current functional level related to goals / functional outcomes: See above.    Remaining deficits: See above.    Education /  Equipment: HEP   Patient agrees to discharge. Patient goals were met. Patient is being discharged due to meeting the stated rehab goals. Thank you for the referral. Earlie Counts, PT 10/16/22 11:45 AM

## 2022-10-17 DIAGNOSIS — M546 Pain in thoracic spine: Secondary | ICD-10-CM | POA: Diagnosis not present

## 2022-10-23 DIAGNOSIS — M5416 Radiculopathy, lumbar region: Secondary | ICD-10-CM | POA: Diagnosis not present

## 2022-10-25 ENCOUNTER — Encounter: Payer: Medicare Other | Admitting: Physical Therapy

## 2022-10-25 NOTE — Progress Notes (Signed)
HPI:  FU CAD. Cardiac catheterization in August 2011 showed a 40% proximal LAD, 30% mid LAD, 30% RCA and her ejection fraction was 60%. Nuclear study in June 2013 showed no ischemia with an ejection fraction of 68%. Nuclear study March 2016 showed ejection fraction 67%. Breast attenuation but no ischemia. MRA of the head October 2018 normal. Renal dopplers 1/19 showed no RAS.  Echocardiogram repeated November 2022 and showed normal LV function, grade 1 diastolic dysfunction, mild mitral regurgitation.  Since last seen she notes some dyspnea on exertion.  She has had intermittent chest pain which has been longstanding and she states is unchanged.  She denies syncope though she does state her blood pressure runs low and she feels dizzy at times.  Her systolic came to the mid 95A.  Current Outpatient Medications  Medication Sig Dispense Refill   acetaminophen (TYLENOL) 500 MG tablet Take 1,000 mg by mouth every 6 (six) hours as needed for mild pain or headache.      albuterol (VENTOLIN HFA) 108 (90 Base) MCG/ACT inhaler Inhale 2 puffs into the lungs every 4 (four) hours as needed. 1 each 0   ALPRAZolam (XANAX) 0.25 MG tablet Take 1 tablet by mouth 3 (three) times daily as needed.     amLODipine (NORVASC) 10 MG tablet Take 1 tablet by mouth once daily 90 tablet 0   aspirin EC 81 MG tablet Take 1 tablet (81 mg total) by mouth daily. 90 tablet 3   atenolol (TENORMIN) 50 MG tablet Take 1 tablet (50 mg total) by mouth 2 (two) times daily. 180 tablet 1   Cyanocobalamin (VITAMIN B-12) 5000 MCG TBDP Take 1 tablet by mouth daily with breakfast.     denosumab (PROLIA) 60 MG/ML SOSY injection Inject 60 mg into the skin every 6 (six) months.     DULoxetine (CYMBALTA) 30 MG capsule Take 1 capsule (30 mg total) by mouth daily. 30 capsule 1   DULoxetine (CYMBALTA) 60 MG capsule Take 1 capsule (60 mg total) by mouth 2 (two) times daily. 180 capsule 1   Evolocumab (REPATHA SURECLICK) 213 MG/ML SOAJ INJECT 140 MG  SUBCUTANEOUSLY EVERY 14 DAYS 6 mL 0   fluticasone (FLONASE) 50 MCG/ACT nasal spray Use 2 spray(s) in each nostril once daily 16 g 5   fluticasone (FLOVENT HFA) 110 MCG/ACT inhaler Inhale 2 puffs into the lungs 2 (two) times daily. 1 each 5   furosemide (LASIX) 20 MG tablet Take 1 tablet (20 mg total) by mouth as needed for edema. 90 tablet 3   gabapentin (NEURONTIN) 300 MG capsule Take 1-2 capsules (300-600 mg total) by mouth 3 (three) times daily.     hydroxychloroquine (PLAQUENIL) 200 MG tablet 1 tablet with food or milk     losartan (COZAAR) 25 MG tablet Take 1 tablet (25 mg total) by mouth daily. 90 tablet 1   Meth-Hyo-M Bl-Na Phos-Ph Sal (URIBEL) 118 MG CAPS Take by mouth.     Multiple Vitamins-Minerals (MULTIVITAMIN WITH MINERALS) tablet Take 1 tablet by mouth daily.     nitroGLYCERIN (NITROSTAT) 0.4 MG SL tablet Place 1 tablet (0.4 mg total) under the tongue every 5 (five) minutes as needed for chest pain. 25 tablet 1   nystatin cream (MYCOSTATIN) Apply 1 application topically 2 (two) times daily. 30 g 2   pantoprazole (PROTONIX) 40 MG tablet Take 1 tablet (40 mg total) by mouth daily. 90 tablet 1   pentosan polysulfate (ELMIRON) 100 MG capsule Take 1 capsule (100 mg total)  by mouth 3 (three) times daily. Reported on 01/05/2016 (Patient taking differently: Take 100 mg by mouth 3 (three) times daily with meals as needed. Reported on 01/05/2016) 90 capsule 11   polyethylene glycol (MIRALAX) 17 g packet Take 17 g by mouth daily. 14 each 0   sucralfate (CARAFATE) 1 GM/10ML suspension Take 10 mLs (1 g total) by mouth 4 (four) times daily -  with meals and at bedtime. 420 mL 1   traZODone (DESYREL) 100 MG tablet Take 1 tablet (100 mg total) by mouth at bedtime. 90 tablet 0   No current facility-administered medications for this visit.     Past Medical History:  Diagnosis Date   Allergy    Anemia    Anxiety    past hx    Arthritis    Asthma    Atrial flutter ([place])    past history- not  current   Breast cancer ([place])    CAD (coronary artery disease) CARDIOLOGIST--  DR Angelena Form   mild non-obstructive cad   Cancer ([place])    right   Cataract    bilaterally removed    Chronic constipation    Chronic kidney disease    stage 3 ckd, interstitial cystitis   Concussion    x 3   COPD (chronic obstructive pulmonary disease) ([place])    Depression    past hx    Family history of malignant hyperthermia    father had this   Fibromyalgia    Frequency of urination    GERD (gastroesophageal reflux disease)    H/O hiatal hernia    History of basal cell carcinoma excision    X2   History of breast cancer ONCOLOGIST-- DR Jana Hakim---  NO RECURRANCE   DX 07/2012;  LOW GRADE DCIS  ER+PR+  ----  S/P RIGHT LUMPECTOMY WITH NEGATIVE MARGINS/   RADIATION ENDED 11/2012   History of chronic bronchitis    History of colonic polyps    History of tachycardia    CONTROLLED  WITH ATENOLOL   Hyperlipidemia    Hypertension    Neuromuscular disorder (H[facility])    fibromyalgia   Neuropathy    Osteoporosis 01/2019   T score -2.2 stable/improved from prior study   Pelvic pain    Personal history of radiation therapy    S/P radiation therapy 11/12/12 - 12/05/12   right Breast   Sepsis ([place]) 2014   from UTI    Sinus headache    Sjogren's disease ([place])    Urgency of urination     Past Surgical History:  Procedure Laterality Date   35 HOUR [place] STUDY N/A 04/03/2016   Procedure: 24 HOUR [place] STUDY;  Surgeon: Manus Gunning, MD;  Location: [facility];  Service: Gastroenterology;  Laterality: N/A;   BREAST BIOPSY Right 08/23/2012   ADH   BREAST BIOPSY Right 10/11/2012   Ductal Carcinoma   BREAST EXCISIONAL BIOPSY Right 09/04/2013   benign   BREAST LUMPECTOMY Right 10-11-2012   W/ [facility]N BX   CARDIAC CATHETERIZATION  09-13-2007  DR Lia Foyer   WELL-PRESERVED LVF/  DIFFUSE SCATTERED CORONARY CALCIFACATION AND ATHEROSCLEROSIS WITHOUT OBSTRUCTION   CARDIAC CATHETERIZATION  08-04-2010  DR Angelena Form    NON-OBSTRUCTIVE CAD/  pLAD 40%/  oLAD 30%/  mLAD 30%/  pRCA 30%/  EF 60%   CARDIOVASCULAR STRESS TEST  06-18-2012  DR McALHANY   LOW RISK NUCLEAR STUDY/  SMALL FIXED AREA OF MODERATELY DECREASED UPTAKE IN ANTEROSEPTAL WALL WHICH MAY BE ARTIFACTUAL/  NO ISCHEMIA/  EF  68%   COLONOSCOPY  09-29-2010   CYSTOSCOPY     CYSTOSCOPY WITH HYDRODISTENSION AND BIOPSY N/A 03/06/2014   Procedure: CYSTOSCOPY/HYDRODISTENSION/ INSTILATION OF MARCAINE AND PYRIDIUM;  Surgeon: Ailene Rud, MD;  Location: [facility];  Service: Urology;  Laterality: N/A;   ESOPHAGEAL MANOMETRY N/A 04/03/2016   Procedure: ESOPHAGEAL MANOMETRY (EM);  Surgeon: Manus Gunning, MD;  Location: [facility];  Service: Gastroenterology;  Laterality: N/A;   EXTRACORPOREAL SHOCK WAVE LITHOTRIPSY Left 02/06/2019   Procedure: EXTRACORPOREAL SHOCK WAVE LITHOTRIPSY (ESWL);  Surgeon: Kathie Rhodes, MD;  Location: [facility];  Service: Urology;  Laterality: Left;   NASAL SINUS SURGERY  1985   ORIF RIGHT ANKLE  FX  2006   POLYPECTOMY     REMOVAL VOCAL CORD CYST  FEB 2014   RIGHT BREAST BX  08-23-2012   RIGHT HAND SURGERY  X3  LAST ONE 2009   INCLUDES  ORIF RIGHT 5TH FINGER AND REVISION TWICE   SKIN CANCER EXCISION     TONSILLECTOMY AND ADENOIDECTOMY  AGE 18   TOTAL ABDOMINAL HYSTERECTOMY W/ BILATERAL SALPINGOOPHORECTOMY  1982   W/  APPENDECTOMY   TRANSTHORACIC ECHOCARDIOGRAM  06-24-2012   GRADE I DIASTOLIC DYSFUNCTION/  EF 55-60%/  MILD MR   UPPER GASTROINTESTINAL ENDOSCOPY      Social History   Socioeconomic History   Marital status: Married    Spouse name: Jori Moll    Number of children: 3   Years of education: BA   Highest education level: Not on file  Occupational History   Occupation: Retired Programmer, multimedia: RETIRED  Tobacco Use   Smoking status: Former    Packs/day: 1.00    Years: 15.00    Total pack years: 15.00    Types: Cigarettes    Quit date: 05/27/2005    Years since quitting: 17.4   Smokeless  tobacco: Never  Vaping Use   Vaping Use: Never used  Substance and Sexual Activity   Alcohol use: No    Alcohol/week: 0.0 standard drinks of alcohol   Drug use: No   Sexual activity: Not Currently    Partners: Male    Birth control/protection: Surgical    Comment: 1st intercourse 76 yo-Fewer than 5 partners, hysterectomy  Other Topics Concern   Not on file  Social History Narrative   Lives with husband.   Caffeine use: 1/2 cup per day   Exercise-- 2days a week [employer],  Water aerobics, walking       Retired Marine scientist   No dietary restrictions, tries to maintain a heart healthy diet   Social Determinants of Health   Financial Resource Strain: Low Risk  (02/28/2022)   Overall Financial Resource Strain (CARDIA)    Difficulty of Paying Living Expenses: Not hard at all  Food Insecurity: No Food Insecurity (02/28/2022)   Hunger Vital Sign    Worried About Running Out of Food in the Last Year: Never true    Ran Out of Food in the Last Year: Never true  Transportation Needs: No Transportation Needs (02/28/2022)   PRAPARE - Hydrologist (Medical): No    Lack of Transportation (Non-Medical): No  Physical Activity: Not on file  Stress: Not on file  Social Connections: Socially Integrated (02/28/2022)   Social Connection and Isolation Panel [NHANES]    Frequency of Communication with Friends and Family: More than three times a week    Frequency of Social Gatherings with Friends and Family: More than three times  a week    Attends Religious Services: More than 4 times per year    Active Member of Clubs or Organizations: Yes    Attends Archivist Meetings: More than 4 times per year    Marital Status: Married  Human resources officer Violence: Not At Risk (02/28/2022)   Humiliation, Afraid, Rape, and Kick questionnaire    Fear of Current or Ex-Partner: No    Emotionally Abused: No    Physically Abused: No    Sexually Abused: No    Family History  Problem Relation  Age of Onset   Rectal cancer Mother    Colon cancer Mother    Pancreatic cancer Mother    Diabetes Mother    Breast cancer Mother 15   Breast cancer Maternal Aunt        breast   Birth defects Maternal Aunt    Irritable bowel syndrome Son    Heart disease Son        CAD, MV replacement   Allergic Disorder Daughter    Diabetes Daughter    Colon cancer Father    Colon polyps Father    Diabetes Father    Stroke Father    Heart disease Father        CHF   Hyperlipidemia Father    Hypertension Father    Arthritis Father    Breast cancer Paternal Aunt    Breast cancer Paternal Aunt    Arthritis Paternal Uncle    Allergic Disorder Daughter    Heart disease Cousin        CAD, had a blood clot after stenting   Esophageal cancer Neg Hx    Stomach cancer Neg Hx     ROS: no fevers or chills, productive cough, hemoptysis, dysphasia, odynophagia, melena, hematochezia, dysuria, hematuria, rash, seizure activity, orthopnea, PND, pedal edema, claudication. Remaining systems are negative.  Physical Exam: Well-developed well-nourished in no acute distress.  Skin is warm and dry.  HEENT is normal.  Neck is supple.  Chest is clear to auscultation with normal expansion.  Cardiovascular exam is regular rate and rhythm.  Abdominal exam nontender or distended. No masses palpated. Extremities show no edema. neuro grossly intact  ECG-normal sinus rhythm at a rate of 71, normal axis, nonspecific ST changes.  Personally reviewed  A/P  1 coronary artery disease-Continue medical therapy.  Continue aspirin.  She is intolerant to statins.  She continues to have occasional chest pain and unchanged.  2 hyperlipidemia-patient is intolerant to statins.  We will continue Repatha.  3 hypertension-blood pressure controlled.  However her blood pressure is running low.  We will start decrease amlodipine 5 mg daily.  Follow blood pressure and adjust medications as needed.  4 dyspnea-follow-up  echocardiogram showed normal LV function.  Symptoms unchanged. Continue Lasix as needed.  Kirk Ruths, MD

## 2022-10-27 ENCOUNTER — Telehealth: Payer: Self-pay | Admitting: Family

## 2022-10-27 ENCOUNTER — Encounter: Payer: Self-pay | Admitting: Family

## 2022-10-27 ENCOUNTER — Telehealth: Payer: Self-pay | Admitting: *Deleted

## 2022-10-27 ENCOUNTER — Encounter: Payer: Self-pay | Admitting: *Deleted

## 2022-10-27 ENCOUNTER — Ambulatory Visit (INDEPENDENT_AMBULATORY_CARE_PROVIDER_SITE_OTHER): Payer: Medicare Other | Admitting: Family

## 2022-10-27 VITALS — BP 123/70 | HR 82 | Temp 97.5°F | Ht 64.0 in | Wt 132.2 lb

## 2022-10-27 DIAGNOSIS — R296 Repeated falls: Secondary | ICD-10-CM

## 2022-10-27 DIAGNOSIS — Z79899 Other long term (current) drug therapy: Secondary | ICD-10-CM

## 2022-10-27 DIAGNOSIS — D649 Anemia, unspecified: Secondary | ICD-10-CM

## 2022-10-27 DIAGNOSIS — R739 Hyperglycemia, unspecified: Secondary | ICD-10-CM | POA: Diagnosis not present

## 2022-10-27 DIAGNOSIS — H8113 Benign paroxysmal vertigo, bilateral: Secondary | ICD-10-CM

## 2022-10-27 LAB — HEMOGLOBIN A1C: Hgb A1c MFr Bld: 5.9 % (ref 4.6–6.5)

## 2022-10-27 LAB — CBC WITH DIFFERENTIAL/PLATELET
Basophils Absolute: 0.1 10*3/uL (ref 0.0–0.1)
Basophils Relative: 0.7 % (ref 0.0–3.0)
Eosinophils Absolute: 0.2 10*3/uL (ref 0.0–0.7)
Eosinophils Relative: 2.2 % (ref 0.0–5.0)
HCT: 37.8 % (ref 36.0–46.0)
Hemoglobin: 12 g/dL (ref 12.0–15.0)
Lymphocytes Relative: 16.1 % (ref 12.0–46.0)
Lymphs Abs: 1.6 10*3/uL (ref 0.7–4.0)
MCHC: 31.8 g/dL (ref 30.0–36.0)
MCV: 87.5 fl (ref 78.0–100.0)
Monocytes Absolute: 0.9 10*3/uL (ref 0.1–1.0)
Monocytes Relative: 8.7 % (ref 3.0–12.0)
Neutro Abs: 7.1 10*3/uL (ref 1.4–7.7)
Neutrophils Relative %: 72.3 % (ref 43.0–77.0)
Platelets: 386 10*3/uL (ref 150.0–400.0)
RBC: 4.32 Mil/uL (ref 3.87–5.11)
RDW: 17.4 % — ABNORMAL HIGH (ref 11.5–15.5)
WBC: 9.9 10*3/uL (ref 4.0–10.5)

## 2022-10-27 LAB — COMPREHENSIVE METABOLIC PANEL
ALT: 7 U/L (ref 0–35)
AST: 13 U/L (ref 0–37)
Albumin: 4 g/dL (ref 3.5–5.2)
Alkaline Phosphatase: 125 U/L — ABNORMAL HIGH (ref 39–117)
BUN: 16 mg/dL (ref 6–23)
CO2: 30 mEq/L (ref 19–32)
Calcium: 9.2 mg/dL (ref 8.4–10.5)
Chloride: 100 mEq/L (ref 96–112)
Creatinine, Ser: 1.05 mg/dL (ref 0.40–1.20)
GFR: 51.32 mL/min — ABNORMAL LOW (ref >60.00)
Glucose, Bld: 86 mg/dL (ref 70–99)
Potassium: 4.3 mEq/L (ref 3.5–5.1)
Sodium: 138 mEq/L (ref 135–145)
Total Bilirubin: 0.4 mg/dL (ref 0.2–1.2)
Total Protein: 6.4 g/dL (ref 6.0–8.3)

## 2022-10-27 LAB — VITAMIN B12: Vitamin B-12: 1097 pg/mL — ABNORMAL HIGH (ref 211–911)

## 2022-10-27 NOTE — Progress Notes (Addendum)
Subjective:   By signing my name below, I, Carylon Perches, attest that this documentation has been prepared under the direction and in the presence of Sheri Chimera, NP 10/27/2022   Patient ID: [name], female    DOB: Sep 07, 1946, 76 y.o.   MRN: [id number]  Chief Complaint  Patient presents with   Follow-up    From the ER    Dizziness    Continue. Foggy not clear     HPI Patient is in today for an office visit   ED Visit (Heart Palpitations): She was admitted to the ED on 10/11/2022 for heart palpitations. She stated that two days before her admission, her blood pressure was dropping, causing her to become dizzy and fall down the stairs. She reports bruises from the encounter. Two days after, she developed a tightness, fluttering/sharp pain that radiated from her right chest to her left arm. Symptoms were ongoing for about five hours. She took an Aspirin and felt better. She was then advised to visit the ED. She has a follow up appointment with Dr.Crenshaw on 11/01/2022. She notes that her husband believes that her medications are causing her to become dizzy. She was evaluated for dizziness by physical therapy about 3 years ago, symptoms improved while in therapy. However, as of recently she noticed a downhill in terms of her symptoms. She notes that she is beginning to start on 10/21/2022 for back pain therapy. She does not take her Tramadol medication. Tylenol makes her nauseated now which is abnormal for her. She has a history of Bell's Palsy  Blood Work: She reports that her blood work done at her time in the ED was not all of the recommended blood work Dr.Blyth had advised the patient to receive. She reports that during her time in the ED her blood sugars was was about 166 mg/dL which is abnormal for her  Lab Results  Component Value Date   HGBA1C 5.8 03/21/2022   Endocrinology: She was informed that Dr.Syed has advised Dr.Blyth to be referred to an endocrinologist. The  patient is not sure why she should schedule an appointment with an endocrinologist.   Immunizations: She states that she is allergic to the influenza vaccine.   Health Maintenance Due  Topic Date Due   Diabetic kidney evaluation - Urine ACR  07/10/2014   COVID-19 Vaccine (5 - Pfizer risk series) 09/19/2021   HEMOGLOBIN A1C  09/21/2022    Past Medical History:  Diagnosis Date   Allergy    Anemia    Anxiety    past hx    Arthritis    Asthma    Atrial flutter ([place])    past history- not current   Breast cancer ([place])    CAD (coronary artery disease) CARDIOLOGIST--  DR Angelena Form   mild non-obstructive cad   Cancer ([place])    right   Cataract    bilaterally removed    Chronic constipation    Chronic kidney disease    stage 3 ckd, interstitial cystitis   Concussion    x 3   COPD (chronic obstructive pulmonary disease) (HCC)    Depression    past hx    Family history of malignant hyperthermia    father had this   Fibromyalgia    Frequency of urination    GERD (gastroesophageal reflux disease)    H/O hiatal hernia    History of basal cell carcinoma excision    X2   History of breast cancer ONCOLOGIST-- DR  MAGRINAT---  NO RECURRANCE   DX 07/2012;  LOW GRADE DCIS  ER+PR+  ----  S/P RIGHT LUMPECTOMY WITH NEGATIVE MARGINS/   RADIATION ENDED 11/2012   History of chronic bronchitis    History of colonic polyps    History of tachycardia    CONTROLLED  WITH ATENOLOL   Hyperlipidemia    Hypertension    Neuromuscular disorder (HCC)    fibromyalgia   Neuropathy    Osteoporosis 01/2019   T score -2.2 stable/improved from prior study   Pelvic pain    Personal history of radiation therapy    S/P radiation therapy 11/12/12 - 12/05/12   right Breast   Sepsis ([place]) 2014   from UTI    Sinus headache    Sjogren's disease ([place])    Urgency of urination     Past Surgical History:  Procedure Laterality Date   60 HOUR [place] STUDY N/A 04/03/2016   Procedure: 24 HOUR PH STUDY;  Surgeon:  Manus Gunning, MD;  Location: [facility];  Service: Gastroenterology;  Laterality: N/A;   BREAST BIOPSY Right 08/23/2012   ADH   BREAST BIOPSY Right 10/11/2012   Ductal Carcinoma   BREAST EXCISIONAL BIOPSY Right 09/04/2013   benign   BREAST LUMPECTOMY Right 10-11-2012   W/ SLN BX   CARDIAC CATHETERIZATION  09-13-2007  DR Lia Foyer   WELL-PRESERVED LVF/  DIFFUSE SCATTERED CORONARY CALCIFACATION AND ATHEROSCLEROSIS WITHOUT OBSTRUCTION   CARDIAC CATHETERIZATION  08-04-2010  DR MCALHANY   NON-OBSTRUCTIVE CAD/  pLAD 40%/  oLAD 30%/  mLAD 30%/  pRCA 30%/  EF 60%   CARDIOVASCULAR STRESS TEST  06-18-2012  DR McALHANY   LOW RISK NUCLEAR STUDY/  SMALL FIXED AREA OF MODERATELY DECREASED UPTAKE IN ANTEROSEPTAL WALL WHICH MAY BE ARTIFACTUAL/  NO ISCHEMIA/  EF 68%   COLONOSCOPY  09-29-2010   CYSTOSCOPY     CYSTOSCOPY WITH HYDRODISTENSION AND BIOPSY N/A 03/06/2014   Procedure: CYSTOSCOPY/HYDRODISTENSION/ INSTILATION OF MARCAINE AND PYRIDIUM;  Surgeon: Ailene Rud, MD;  Location: [facility];  Service: Urology;  Laterality: N/A;   ESOPHAGEAL MANOMETRY N/A 04/03/2016   Procedure: ESOPHAGEAL MANOMETRY (EM);  Surgeon: Manus Gunning, MD;  Location: [facility];  Service: Gastroenterology;  Laterality: N/A;   EXTRACORPOREAL SHOCK WAVE LITHOTRIPSY Left 02/06/2019   Procedure: EXTRACORPOREAL SHOCK WAVE LITHOTRIPSY (ESWL);  Surgeon: Kathie Rhodes, MD;  Location: [facility];  Service: Urology;  Laterality: Left;   NASAL SINUS SURGERY  1985   ORIF RIGHT ANKLE  FX  2006   POLYPECTOMY     REMOVAL VOCAL CORD CYST  FEB 2014   RIGHT BREAST BX  08-23-2012   RIGHT HAND SURGERY  X3  LAST ONE 2009   INCLUDES  ORIF RIGHT 5TH FINGER AND REVISION TWICE   SKIN CANCER EXCISION     TONSILLECTOMY AND ADENOIDECTOMY  AGE 10   TOTAL ABDOMINAL HYSTERECTOMY W/ BILATERAL SALPINGOOPHORECTOMY  1982   W/  APPENDECTOMY   TRANSTHORACIC ECHOCARDIOGRAM  06-24-2012   GRADE I DIASTOLIC DYSFUNCTION/  EF  55-60%/  MILD MR   UPPER GASTROINTESTINAL ENDOSCOPY      Family History  Problem Relation Age of Onset   Rectal cancer Mother    Colon cancer Mother    Pancreatic cancer Mother    Diabetes Mother    Breast cancer Mother 71   Breast cancer Maternal Aunt        breast   Birth defects Maternal Aunt    Irritable bowel syndrome Son    Heart  disease Son        CAD, MV replacement   Allergic Disorder Daughter    Diabetes Daughter    Colon cancer Father    Colon polyps Father    Diabetes Father    Stroke Father    Heart disease Father        CHF   Hyperlipidemia Father    Hypertension Father    Arthritis Father    Breast cancer Paternal Aunt    Breast cancer Paternal Aunt    Arthritis Paternal Uncle    Allergic Disorder Daughter    Heart disease Cousin        CAD, had a blood clot after stenting   Esophageal cancer Neg Hx    Stomach cancer Neg Hx     Social History   Socioeconomic History   Marital status: Married    Spouse name: Jori Moll    Number of children: 3   Years of education: BA   Highest education level: Not on file  Occupational History   Occupation: Retired Programmer, multimedia: RETIRED  Tobacco Use   Smoking status: Former    Packs/day: 1.00    Years: 15.00    Total pack years: 15.00    Types: Cigarettes    Quit date: 05/27/2005    Years since quitting: 17.4   Smokeless tobacco: Never  Vaping Use   Vaping Use: Never used  Substance and Sexual Activity   Alcohol use: No    Alcohol/week: 0.0 standard drinks of alcohol   Drug use: No   Sexual activity: Not Currently    Partners: Male    Birth control/protection: Surgical    Comment: 1st intercourse 76 yo-Fewer than 5 partners, hysterectomy  Other Topics Concern   Not on file  Social History Narrative   Lives with husband.   Caffeine use: 1/2 cup per day   Exercise-- 2days a week [employer],  Water aerobics, walking       Retired Marine scientist   No dietary restrictions, tries to maintain a heart healthy diet    Social Determinants of Health   Financial Resource Strain: Low Risk  (02/28/2022)   Overall Financial Resource Strain (CARDIA)    Difficulty of Paying Living Expenses: Not hard at all  Food Insecurity: No Food Insecurity (02/28/2022)   Hunger Vital Sign    Worried About Running Out of Food in the Last Year: Never true    Ran Out of Food in the Last Year: Never true  Transportation Needs: No Transportation Needs (02/28/2022)   PRAPARE - Hydrologist (Medical): No    Lack of Transportation (Non-Medical): No  Physical Activity: Not on file  Stress: Not on file  Social Connections: Socially Integrated (02/28/2022)   Social Connection and Isolation Panel [NHANES]    Frequency of Communication with Friends and Family: More than three times a week    Frequency of Social Gatherings with Friends and Family: More than three times a week    Attends Religious Services: More than 4 times per year    Active Member of [employer] or Organizations: Yes    Attends Archivist Meetings: More than 4 times per year    Marital Status: Married  Human resources officer Violence: Not At Risk (02/28/2022)   Humiliation, Afraid, Rape, and Kick questionnaire    Fear of Current or Ex-Partner: No    Emotionally Abused: No    Physically Abused: No    Sexually Abused: No  Outpatient Medications Prior to Visit  Medication Sig Dispense Refill   acetaminophen (TYLENOL) 500 MG tablet Take 1,000 mg by mouth every 6 (six) hours as needed for mild pain or headache.      albuterol (VENTOLIN HFA) 108 (90 Base) MCG/ACT inhaler Inhale 2 puffs into the lungs every 4 (four) hours as needed. 1 each 0   amLODipine (NORVASC) 10 MG tablet Take 1 tablet by mouth once daily 90 tablet 1   aspirin EC 81 MG tablet Take 1 tablet (81 mg total) by mouth daily. 90 tablet 3   atenolol (TENORMIN) 50 MG tablet Take 1 tablet (50 mg total) by mouth 2 (two) times daily. 180 tablet 1   Cyanocobalamin (VITAMIN B-12) 5000  MCG TBDP Take 1 tablet by mouth daily with breakfast.     denosumab (PROLIA) 60 MG/ML SOSY injection Inject 60 mg into the skin every 6 (six) months.     DULoxetine (CYMBALTA) 30 MG capsule Take 1 capsule (30 mg total) by mouth daily. 30 capsule 1   DULoxetine (CYMBALTA) 60 MG capsule Take 1 capsule (60 mg total) by mouth 2 (two) times daily. 180 capsule 1   Evolocumab (REPATHA SURECLICK) 443 MG/ML SOAJ INJECT 140 MG SUBCUTANEOUSLY EVERY 14 DAYS 6 mL 0   fluticasone (FLONASE) 50 MCG/ACT nasal spray Use 2 spray(s) in each nostril once daily 16 g 5   fluticasone (FLOVENT HFA) 110 MCG/ACT inhaler Inhale 2 puffs into the lungs 2 (two) times daily. 1 each 5   furosemide (LASIX) 20 MG tablet Take 1 tablet (20 mg total) by mouth as needed for edema. 90 tablet 3   gabapentin (NEURONTIN) 300 MG capsule Take 1-2 capsules (300-600 mg total) by mouth 3 (three) times daily.     hydroxychloroquine (PLAQUENIL) 200 MG tablet 1 tablet with food or milk     losartan (COZAAR) 25 MG tablet Take 1 tablet (25 mg total) by mouth daily. 90 tablet 1   nitroGLYCERIN (NITROSTAT) 0.4 MG SL tablet Place 1 tablet (0.4 mg total) under the tongue every 5 (five) minutes as needed for chest pain. 25 tablet 1   nystatin cream (MYCOSTATIN) Apply 1 application topically 2 (two) times daily. 30 g 2   pantoprazole (PROTONIX) 40 MG tablet Take 1 tablet (40 mg total) by mouth daily. 90 tablet 1   pentosan polysulfate (ELMIRON) 100 MG capsule Take 1 capsule (100 mg total) by mouth 3 (three) times daily. Reported on 01/05/2016 (Patient taking differently: Take 100 mg by mouth 3 (three) times daily with meals as needed. Reported on 01/05/2016) 90 capsule 11   polyethylene glycol (MIRALAX) 17 g packet Take 17 g by mouth daily. 14 each 0   sucralfate (CARAFATE) 1 GM/10ML suspension Take 10 mLs (1 g total) by mouth 4 (four) times daily -  with meals and at bedtime. 420 mL 1   traZODone (DESYREL) 100 MG tablet Take 1 tablet (100 mg total) by mouth  at bedtime. 90 tablet 0   Meth-Hyo-M Bl-Na Phos-Ph Sal (URIBEL) 118 MG CAPS Take by mouth.     benzonatate (TESSALON) 200 MG capsule Take 1 capsule (200 mg total) by mouth 3 (three) times daily as needed. 45 capsule 1   predniSONE (DELTASONE) 20 MG tablet Take 1 tablet (20 mg total) by mouth daily with breakfast. 5 tablet 0   No facility-administered medications prior to visit.    Allergies  Allergen Reactions   Clindamycin Rash and Shortness Of Breath   Clindamycin Hcl Shortness Of Breath  and Rash   Penicillins Anaphylaxis   Rosuvastatin Anaphylaxis   Baclofen Other (See Comments) and Rash   Lincomycin Other (See Comments)   Prednisone Rash    Other reaction(s): Rash, Hives - can tolerate if she takes with benadryl    Clindamycin Hcl     Other reaction(s): Shortness of Breath, Rash   Codeine Hives and Other (See Comments)    headache Other reaction(s): Headache, Nausea, headache   Erythromycin Hives   Erythromycin Base Other (See Comments)    other   Fluzone [Influenza Virus Vaccine] Other (See Comments)    Local reaction at the site   Haemophilus Influenzae Other (See Comments)    Local reaction at the site Local reaction at the site   Haemophilus Influenzae Vaccines Other (See Comments)    Local reaction at site   Latex Hives   Levofloxacin     Other reaction(s): sick   Other Other (See Comments)    Local reaction at site   Pentazocine Other (See Comments)   Pentazocine Lactate Other (See Comments)    HALLUCINATION   Pneumococcal Vaccine Hives and Swelling    Other reaction(s): Hives, Shortness of Breath   Pneumococcal Vaccine Polyvalent Hives, Swelling and Other (See Comments)    REACTION: redness, swelling, and hives at injection site   Tamoxifen Nausea And Vomiting and Other (See Comments)    HEADACHE Other reaction(s): Headache, Nausea    ROS See HPI    Objective:    Physical Exam Constitutional:      General: She is not in acute distress.     Appearance: Normal appearance. She is not ill-appearing.  HENT:     Head: Normocephalic and atraumatic.     Right Ear: External ear normal.     Left Ear: External ear normal.  Eyes:     Extraocular Movements: Extraocular movements intact.     Pupils: Pupils are equal, round, and reactive to light.  Cardiovascular:     Rate and Rhythm: Normal rate and regular rhythm.     Heart sounds: Normal heart sounds. No murmur heard.    No gallop.  Pulmonary:     Effort: Pulmonary effort is normal. No respiratory distress.     Breath sounds: Normal breath sounds. No wheezing or rales.  Musculoskeletal:     Comments: 5/5 strength in both upper and lower extremities  Skin:    General: Skin is warm and dry.  Neurological:     Mental Status: She is alert and oriented to person, place, and time.  Psychiatric:        Mood and Affect: Mood normal.        Behavior: Behavior normal.        Judgment: Judgment normal.     BP 123/70   Pulse 82   Temp (!) 97.5 F (36.4 C) (Oral)   Ht _0  (1.626 m)   Wt 132 lb 3.2 oz (60 kg)   SpO2 96%   BMI 22.69 kg/m  Wt Readings from Last 3 Encounters:  10/27/22 132 lb 3.2 oz (60 kg)  09/21/22 134 lb (60.8 kg)  08/21/22 134 lb 6 oz (61 kg)       Assessment & Plan:   Problem List Items Addressed This Visit       Unprioritized   Recurrent falls    Notes increased dizziness which is similar to previous issues with BPPV. Reports that she responded well to Vestibular rehab 3 years ago.  Will refer her back  to PT for Vestibular rehab.       Hyperglycemia   Relevant Orders   Hemoglobin A1c   Comp Met (CMET)   Anemia - Primary   Relevant Orders   B12   Iron, TIBC and Ferritin Panel   CBC with Differential/Platelet   Other Visit Diagnoses     Benign paroxysmal positional vertigo due to bilateral vestibular disorder       Relevant Orders   Ambulatory referral to Physical Therapy   Polypharmacy       Relevant Orders   AMB Referral to [place]      No orders of the defined types were placed in this encounter.   I, Nance Pear, NP, personally preformed the services described in this documentation.  All medical record entries made by the scribe were at my direction and in my presence.  I have reviewed the chart and discharge instructions (if applicable) and agree that the record reflects my personal performance and is accurate and complete. 10/27/2022   I,Amber Collins,acting as a scribe for Nance Pear, NP.,have documented all relevant documentation on the behalf of Nance Pear, NP,as directed by  Nance Pear, NP while in the presence of Nance Pear, NP.    Nance Pear, NP

## 2022-10-27 NOTE — Progress Notes (Signed)
  Care Coordination   Note   10/27/2022 Name: [name] MRN: [id number] DOB: [birth year]  [name] is a 76 y.o. year old female who sees Mosie Lukes, MD for primary care. I reached out to [name] by phone today to offer care coordination services.  Ms. Harbor was given information about Care Coordination services today including:   The Care Coordination services include support from the care team which includes your Nurse Coordinator, Clinical Social Worker, or Pharmacist.  The Care Coordination team is here to help remove barriers to the health concerns and goals most important to you. Care Coordination services are voluntary, and the patient may decline or stop services at any time by request to their care team member.   Care Coordination Consent Status: Patient agreed to services and verbal consent obtained.   Follow up plan:  Telephone appointment with care coordination team member scheduled for:  11/02/2022  Encounter Outcome:  Pt. Scheduled from referral   Julian Hy, [place] Direct Dial: [phone]

## 2022-10-27 NOTE — Assessment & Plan Note (Signed)
Notes increased dizziness which is similar to previous issues with BPPV. Reports that she responded well to Vestibular rehab 3 years ago.  Will refer her back to PT for Vestibular rehab.

## 2022-10-27 NOTE — Telephone Encounter (Signed)
requested

## 2022-10-27 NOTE — Telephone Encounter (Signed)
Can you please call Dr. Audelia Hives office and request a copy of her last office visit.

## 2022-10-28 LAB — IRON,TIBC AND FERRITIN PANEL
%SAT: 16 % (calc) (ref 16–45)
Ferritin: 6 ng/mL — ABNORMAL LOW (ref 16–288)
Iron: 52 ug/dL (ref 45–160)
TIBC: 328 mcg/dL (calc) (ref 250–450)

## 2022-10-29 ENCOUNTER — Telehealth: Payer: Self-pay | Admitting: Family

## 2022-10-29 MED ORDER — MULTI-VITAMIN/MINERALS PO TABS: 1.0000 | ORAL_TABLET | Freq: Every day | ORAL | Status: DC

## 2022-10-29 NOTE — Telephone Encounter (Signed)
Iron level is a little low. Please add multivitamin with minerals once daily. Other labs look good.

## 2022-10-30 ENCOUNTER — Encounter: Payer: Medicare Other | Admitting: Physical Therapy

## 2022-10-30 NOTE — Telephone Encounter (Signed)
Patient advised of results and provider's recommendations.

## 2022-10-31 ENCOUNTER — Other Ambulatory Visit: Payer: Self-pay | Admitting: Cardiology

## 2022-11-01 ENCOUNTER — Ambulatory Visit: Payer: Medicare Other | Admitting: Emergency Medicine

## 2022-11-01 ENCOUNTER — Encounter: Payer: Self-pay | Admitting: Cardiology

## 2022-11-01 ENCOUNTER — Ambulatory Visit: Payer: Medicare Other | Attending: Cardiology | Admitting: Cardiology

## 2022-11-01 VITALS — BP 108/76 | HR 71 | Ht 64.0 in | Wt 133.8 lb

## 2022-11-01 DIAGNOSIS — I2510 Atherosclerotic heart disease of native coronary artery without angina pectoris: Secondary | ICD-10-CM | POA: Diagnosis not present

## 2022-11-01 DIAGNOSIS — I10 Essential (primary) hypertension: Secondary | ICD-10-CM

## 2022-11-01 DIAGNOSIS — E785 Hyperlipidemia, unspecified: Secondary | ICD-10-CM | POA: Diagnosis not present

## 2022-11-01 DIAGNOSIS — R0602 Shortness of breath: Secondary | ICD-10-CM

## 2022-11-01 MED ORDER — AMLODIPINE BESYLATE 5 MG PO TABS: 5.0000 mg | ORAL_TABLET | Freq: Every day | ORAL | 3 refills | Status: DC

## 2022-11-01 NOTE — Patient Instructions (Signed)
Medication Instructions:   STOP LOSARTAN  DECREASE AMLODIPINE TO 5 MG ONCE DAILY=1/2 OF THE 10 MG TABLETS ONCE DAILY  *If you need a refill on your cardiac medications before your next appointment, please call your pharmacy*   Follow-Up: At [facility], you and your health needs are our priority.  As part of our continuing mission to provide you with exceptional heart care, we have created designated Provider Care Teams.  These Care Teams include your primary Cardiologist (physician) and Advanced Practice Providers (APPs -  Physician Assistants and Nurse Practitioners) who all work together to provide you with the care you need, when you need it.  We recommend signing up for the patient portal called "MyChart".  Sign up information is provided on this After Visit Summary.  MyChart is used to connect with patients for Virtual Visits (Telemedicine).  Patients are able to view lab/test results, encounter notes, upcoming appointments, etc.  Non-urgent messages can be sent to your provider as well.   To learn more about what you can do with MyChart, go to [email or web address].    Your next appointment:   6 month(s)  The format for your next appointment:   In Person  Provider:   Kirk Ruths MD

## 2022-11-02 ENCOUNTER — Ambulatory Visit: Payer: Self-pay

## 2022-11-02 ENCOUNTER — Other Ambulatory Visit: Payer: Self-pay | Admitting: Family Medicine

## 2022-11-02 DIAGNOSIS — E785 Hyperlipidemia, unspecified: Secondary | ICD-10-CM

## 2022-11-02 DIAGNOSIS — M810 Age-related osteoporosis without current pathological fracture: Secondary | ICD-10-CM

## 2022-11-02 NOTE — Patient Outreach (Signed)
  Care Coordination   Initial Visit Note   11/02/2022 Name: [name] MRN: [id number] DOB: 02-25-1946  [name] is a 76 y.o. year old female who sees Sheri Lukes, MD for primary care. I spoke with  [name] by phone today.  What matters to the patients health and wellness today?  [name] states she has a history of falls and has seen primary care provider (10/27/22) and is scheduled for vestibular rehab at the end of the month. She also reports recent low blood pressures. She reports medications are being adjusted. Cardiology visit on 11/01/22. She states she is to check blood pressure readings and report to cardiologist in three weeks. [name] also expresses she get affected emotionally related to different events that happen in the world such as the war or events that happen in her children's life. Recent ED visit on 10/11/22 related to anxiety. She reports she has her medications, but states she is in the "donut hole". She adds her husband works to help pay for her medications.   Goals Addressed             This Visit's Progress    Health Management       Care Coordination Interventions: Social Work referral for related life events, anxiety Discussed fall prevention strategies-encouraged to change position slowly and make sure she is steady before stepping to move  Reviewed upcoming appointment and encouraged to attend as scheduled/recommended Encouraged to take medications as prescribed Encouraged patient to check and record blood pressure and follow up with cardiologist per cardiologist recommendation to report readings in 3 weeks Encouraged patient to notify provider if condition worsens or does not improve Encouraged patient to attend Vestibular rehab as scheduled Collaborate with clinical pharmacist to see if there are any medication assistance program available to patient       SDOH assessments and interventions completed:  Yes  SDOH Interventions Today     Flowsheet Row Most Recent Value  SDOH Interventions   Food Insecurity Interventions Intervention Not Indicated  Housing Interventions Intervention Not Indicated  Transportation Interventions Intervention Not Indicated  Utilities Interventions Intervention Not Indicated     Care Coordination Interventions Activated:  Yes  Care Coordination Interventions:  Yes, provided   Follow up plan: Follow up call scheduled for 11/28/22    Encounter Outcome:  Pt. Visit Completed   Thea Silversmith, RN, MSN, BSN, [place] Coordinator [phone]

## 2022-11-02 NOTE — Patient Instructions (Signed)
Visit Information  Thank you for taking time to visit with me today. Please don't hesitate to contact me if I can be of assistance to you.   Following are the goals we discussed today:   Goals Addressed             This Visit's Progress    Health Management       Care Coordination Interventions: Social Work referral for related life events, anxiety Discussed fall prevention strategies-encouraged to change position slowly and make sure she is steady before stepping to move  Reviewed upcoming appointment and encouraged to attend as scheduled/recommended Encouraged to take medications as prescribed Encouraged patient to check and record blood pressure and follow up with cardiologist per cardiologist recommendation to report readings in 3 weeks Encouraged patient to notify provider if condition worsens or does not improve Encouraged patient to attend Vestibular rehab as scheduled Collaborate with clinical pharmacist to see if there are any medication assistance program available to patient       Our next appointment is by telephone on 11/28/22 at 9:30 am  Please call the care guide team at [phone] if you need to cancel or reschedule your appointment.   If you are experiencing a Mental Health or [place] or need someone to talk to, please call the Suicide and Crisis Lifeline: 988  Patient verbalizes understanding of instructions and care plan provided today and agrees to view in [place]. Active MyChart status and patient understanding of how to access instructions and care plan via MyChart confirmed with patient.     Thea Silversmith, RN, MSN, BSN, [place] Coordinator [phone]

## 2022-11-07 ENCOUNTER — Telehealth: Payer: Self-pay | Admitting: Pharmacist

## 2022-11-07 ENCOUNTER — Telehealth: Payer: Medicare Other

## 2022-11-07 DIAGNOSIS — E785 Hyperlipidemia, unspecified: Secondary | ICD-10-CM

## 2022-11-07 DIAGNOSIS — I2510 Atherosclerotic heart disease of native coronary artery without angina pectoris: Secondary | ICD-10-CM

## 2022-11-07 NOTE — Telephone Encounter (Signed)
Spoke with patient today about options to help with cost of [place]. I think she would qualify for a [employer] Grant. Discussed process with patient today. She would like to talk to her husband about program and will call me back in the next few days.

## 2022-11-07 NOTE — Telephone Encounter (Signed)
-----   Message from Mosie Lukes, MD sent at 11/02/2022  1:07 PM EST ----- Regarding: RE: Question re: medication assistance Thanks all, I have placed the referral. Interesting side note yesterday I was told 2016 was not up and running yet but it worked. Regards, Dr B ----- Message ----- From: Cherre Robins, RPH-CPP Sent: 11/02/2022   1:01 PM EST To: Luretha Rued, RN; Mosie Lukes, MD Subject: RE: Question re: medication assistance         Will need ok by PCP to assist but I can meet with her and explore possible options and do a financial assessment.  Going forward when you send a request just CC PCP so they can review and make official referral.  Dr Charlett Blake - Can you put in a REF 2016 for pharmacy medication assistance?  Pike Scantlebury ----- Message ----- From: Luretha Rued, RN Sent: 11/02/2022  12:05 PM EST To: Cherre Robins, RPH-CPP Subject: Question re: medication assistance             Hi Theador Jezewski,  Mrs. Darsey informed me that she is in the "donut hole" she reports the medications "Prolia and Repatha" are the medications that are most costly. She says her husband has gone back to work to help her pay for her medications. Is there any available resource that can help her with the cost of her medications?  Thea Silversmith, RN, MSN, BSN, [place] Coordinator [phone]

## 2022-11-08 ENCOUNTER — Other Ambulatory Visit: Payer: Self-pay | Admitting: Cardiology

## 2022-11-08 ENCOUNTER — Encounter: Payer: Self-pay | Admitting: Pharmacist

## 2022-11-08 DIAGNOSIS — E785 Hyperlipidemia, unspecified: Secondary | ICD-10-CM

## 2022-11-08 DIAGNOSIS — I2510 Atherosclerotic heart disease of native coronary artery without angina pectoris: Secondary | ICD-10-CM

## 2022-11-08 MED ORDER — REPATHA SURECLICK 140 MG/ML ~~LOC~~ SOAJ: 140.0000 mg | SUBCUTANEOUS | 1 refills | Status: DC

## 2022-11-08 NOTE — Telephone Encounter (Signed)
Patient approved for [facility] for $2500 10/09/2022 thru 10/10/2023.  Updated Rx sent to [facility] and provided them with information below to process medication with [employer].

## 2022-11-08 NOTE — Telephone Encounter (Signed)
Patient called requesting a call back from [facility] whenever possible. Please advise.

## 2022-11-14 ENCOUNTER — Ambulatory Visit: Payer: Self-pay | Admitting: Licensed Clinical Social Worker

## 2022-11-14 DIAGNOSIS — F411 Generalized anxiety disorder: Secondary | ICD-10-CM

## 2022-11-14 DIAGNOSIS — F32A Depression, unspecified: Secondary | ICD-10-CM

## 2022-11-14 NOTE — Patient Outreach (Signed)
  Care Coordination  Initial Visit Note   11/14/2022 Name: [name] MRN: [id number] DOB: March 20, 1946  [name] is a 76 y.o. year old female who sees Mosie Lukes, MD for primary care. I spoke with  [name] by phone today.  What matters to the patients health and wellness today?  Managing her emotions  Patient is experiencing symptoms of  anxiety depression which seems to be exacerbated by finding her new normal with health decline.. Patient has a history of depression, family history of depression with her mother. Traumatic life transitions when she was younger    Recommendation: Patient may benefit from, and is in agreement to Go to therapy to work on establishing her new normal. She would also benefit from CBT  .    Goals Addressed             This Visit's Progress    Care Coordination Connect for therapy       Care Coordination Interventions: Motivational Interviewing employed Solution-Focused Strategies employed:  Active listening / Reflection utilized  Emotional Support Provided Problem [place] strategies reviewed Provided general psycho-education for mental health needs  Reviewed mental health medications and discussed importance of compliance: reports no missed does with Cymbalta &  TraZODone . Has only taken about 5 xanax Participation in counseling encouraged :   Provided EMMI education information on :Depression Discussed referral options to connect for ongoing therapy: based on need and insurance Made referral to [facility] assessments and interventions completed:  Yes  SDOH Interventions Today    Flowsheet Row Most Recent Value  SDOH Interventions   Stress Interventions Provide Counseling       Care Coordination Interventions Activated:  Yes  Care Coordination Interventions:  Yes, provided   Follow up plan:  Referral made to [facility] Follow up call scheduled for 11/27/22     Encounter Outcome:  Pt. Visit Completed   [name], [place] [phone]

## 2022-11-14 NOTE — Patient Instructions (Signed)
Visit Information  Thank you for taking time to visit with me today. Please don't hesitate to contact me if I can be of assistance to you.   Following are the goals we discussed today:   Goals Addressed             This Visit's Progress    Care Coordination Connect for therapy       Care Coordination Interventions: Motivational Interviewing employed Solution-Focused Strategies employed:  Active listening / Reflection utilized  Emotional Support Provided Problem [place] strategies reviewed Provided general psycho-education for mental health needs  Reviewed mental health medications and discussed importance of compliance: reports no missed does with Cymbalta &  TraZODone . Has only taken about 5 xanax Participation in counseling encouraged :   Provided EMMI education information on :Depression Discussed referral options to connect for ongoing therapy: based on need and insurance Made referral to [place] next appointment is by telephone on 11/27/22 at 10:00  Please call the care guide team at [phone] if you need to cancel or reschedule your appointment.   If you are experiencing a Mental Health or [place] or need someone to talk to, please call the Suicide and Crisis Lifeline: 988 call the [place] National Suicide Prevention Lifeline: 626 107 1073 or TTY: 559 547 2686 TTY (980) 406-8347) to talk to a trained counselor call 1-800-273-TALK (toll free, 24 hour hotline)   Patient verbalizes understanding of instructions and care plan provided today and agrees to view in [place]. Active MyChart status and patient understanding of how to access instructions and care plan via MyChart confirmed with patient.     [name], [place] [phone]

## 2022-11-21 ENCOUNTER — Encounter: Payer: Self-pay | Admitting: Physical Therapy

## 2022-11-21 ENCOUNTER — Ambulatory Visit: Payer: Medicare Other | Attending: Family Medicine | Admitting: Physical Therapy

## 2022-11-21 DIAGNOSIS — G8929 Other chronic pain: Secondary | ICD-10-CM | POA: Insufficient documentation

## 2022-11-21 DIAGNOSIS — M6281 Muscle weakness (generalized): Secondary | ICD-10-CM | POA: Diagnosis not present

## 2022-11-21 DIAGNOSIS — R252 Cramp and spasm: Secondary | ICD-10-CM | POA: Diagnosis not present

## 2022-11-21 DIAGNOSIS — R42 Dizziness and giddiness: Secondary | ICD-10-CM | POA: Diagnosis not present

## 2022-11-21 DIAGNOSIS — M542 Cervicalgia: Secondary | ICD-10-CM | POA: Diagnosis not present

## 2022-11-21 DIAGNOSIS — R2681 Unsteadiness on feet: Secondary | ICD-10-CM | POA: Insufficient documentation

## 2022-11-21 DIAGNOSIS — M5459 Other low back pain: Secondary | ICD-10-CM | POA: Insufficient documentation

## 2022-11-21 DIAGNOSIS — M545 Low back pain, unspecified: Secondary | ICD-10-CM | POA: Diagnosis not present

## 2022-11-21 NOTE — Therapy (Signed)
OUTPATIENT PHYSICAL THERAPY EVALUATION   Patient Name: [name] MRN: [id number] DOB:[birth year], 76 y.o., female Today's Date: 11/21/2022  END OF SESSION:  PT End of Session - 11/21/22 1825     Visit Number 1    Number of Visits 16    Date for PT Re-Evaluation 01/16/23    Authorization Type BCBS medicare    Progress Note Due on Visit 10    PT Start Time 1450    PT Stop Time 1535    PT Time Calculation (min) 45 min    Activity Tolerance Patient tolerated treatment well;Patient limited by pain    Behavior During Therapy WFL for tasks assessed/performed             Past Medical History:  Diagnosis Date   Allergy    Anemia    Anxiety    past hx    Arthritis    Asthma    Atrial flutter ([place])    past history- not current   Breast cancer ([place])    CAD (coronary artery disease) CARDIOLOGIST--  DR Angelena Form   mild non-obstructive cad   Cancer ([place])    right   Cataract    bilaterally removed    Chronic constipation    Chronic kidney disease    stage 3 ckd, interstitial cystitis   Concussion    x 3   COPD (chronic obstructive pulmonary disease) ([place])    Depression    past hx    Family history of malignant hyperthermia    father had this   Fibromyalgia    Frequency of urination    GERD (gastroesophageal reflux disease)    H/O hiatal hernia    History of basal cell carcinoma excision    X2   History of breast cancer ONCOLOGIST-- DR Jana Hakim---  NO RECURRANCE   DX 07/2012;  LOW GRADE DCIS  ER+PR+  ----  S/P RIGHT LUMPECTOMY WITH NEGATIVE MARGINS/   RADIATION ENDED 11/2012   History of chronic bronchitis    History of colonic polyps    History of tachycardia    CONTROLLED  WITH ATENOLOL   Hyperlipidemia    Hypertension    Neuromuscular disorder (HCC)    fibromyalgia   Neuropathy    Osteoporosis 01/2019   T score -2.2 stable/improved from prior study   Pelvic pain    Personal history of radiation therapy    S/P radiation therapy 11/12/12 - 12/05/12    right Breast   Sepsis ([place]) 2014   from UTI    Sinus headache    Sjogren's disease ([place])    Urgency of urination    Past Surgical History:  Procedure Laterality Date   66 HOUR [place] STUDY N/A 04/03/2016   Procedure: 24 HOUR PH STUDY;  Surgeon: Manus Gunning, MD;  Location: [facility];  Service: Gastroenterology;  Laterality: N/A;   BREAST BIOPSY Right 08/23/2012   ADH   BREAST BIOPSY Right 10/11/2012   Ductal Carcinoma   BREAST EXCISIONAL BIOPSY Right 09/04/2013   benign   BREAST LUMPECTOMY Right 10-11-2012   W/ SLN BX   CARDIAC CATHETERIZATION  09-13-2007  DR Lia Foyer   WELL-PRESERVED LVF/  DIFFUSE SCATTERED CORONARY CALCIFACATION AND ATHEROSCLEROSIS WITHOUT OBSTRUCTION   CARDIAC CATHETERIZATION  08-04-2010  DR MCALHANY   NON-OBSTRUCTIVE CAD/  pLAD 40%/  oLAD 30%/  mLAD 30%/  pRCA 30%/  EF 60%   CARDIOVASCULAR STRESS TEST  06-18-2012  DR McALHANY   LOW RISK NUCLEAR STUDY/  SMALL FIXED AREA OF  MODERATELY DECREASED UPTAKE IN ANTEROSEPTAL WALL WHICH MAY BE ARTIFACTUAL/  NO ISCHEMIA/  EF 68%   COLONOSCOPY  09-29-2010   CYSTOSCOPY     CYSTOSCOPY WITH HYDRODISTENSION AND BIOPSY N/A 03/06/2014   Procedure: CYSTOSCOPY/HYDRODISTENSION/ INSTILATION OF MARCAINE AND PYRIDIUM;  Surgeon: Ailene Rud, MD;  [facility];  Service: Urology;  Laterality: N/A;   ESOPHAGEAL MANOMETRY N/A 04/03/2016   Procedure: ESOPHAGEAL MANOMETRY (EM);  Surgeon: Manus Gunning, MD;  Location: [facility];  Service: Gastroenterology;  Laterality: N/A;   EXTRACORPOREAL SHOCK WAVE LITHOTRIPSY Left 02/06/2019   Procedure: EXTRACORPOREAL SHOCK WAVE LITHOTRIPSY (ESWL);  Surgeon: Kathie Rhodes, MD;  Location: [facility];  Service: Urology;  Laterality: Left;   NASAL SINUS SURGERY  1985   ORIF RIGHT ANKLE  FX  2006   POLYPECTOMY     REMOVAL VOCAL CORD CYST  FEB 2014   RIGHT BREAST BX  08-23-2012   RIGHT HAND SURGERY  X3  LAST ONE 2009   INCLUDES  ORIF RIGHT 5TH FINGER AND  REVISION TWICE   SKIN CANCER EXCISION     TONSILLECTOMY AND ADENOIDECTOMY  AGE 56   TOTAL ABDOMINAL HYSTERECTOMY W/ BILATERAL SALPINGOOPHORECTOMY  1982   W/  APPENDECTOMY   TRANSTHORACIC ECHOCARDIOGRAM  06-24-2012   GRADE I DIASTOLIC DYSFUNCTION/  EF 55-60%/  MILD MR   UPPER GASTROINTESTINAL ENDOSCOPY     Patient Active Problem List   Diagnosis Date Noted   Hyperkalemia 09/28/2022   Anemia 09/28/2022   Tremor 07/05/2022   RUQ pain 07/05/2022   Leukocytosis 07/05/2022   Chronic renal insufficiency 03/21/2022   Sjogren's disease ([place]) 12/12/2021   Vocal cord cyst 05/18/2021   High serum vitamin D 05/18/2021   Dysuria 03/09/2021   Vitamin B12 deficiency 03/08/2021   Preventative health care 03/08/2021   Hyperglycemia 03/08/2021   Low back pain 09/08/2020   OSA and COPD overlap syndrome ([place]) 06/10/2020   Recurrent falls 05/02/2020   Statin intolerance 02/29/2020   RLS (restless legs syndrome) 11/09/2019   Kidney stone 02/26/2019   Recurrent sinusitis 02/25/2019   Hx of colonic polyp 02/25/2019   DDD (degenerative disc disease), cervical 09/17/2018   Degenerative scoliosis 04/22/2018   Primary insomnia 06/25/2017   Chronic interstitial cystitis 02/01/2017   Chronic cough 01/09/2017   Right arm pain 07/20/2016   Deviated septum 05/12/2016   Nasal turbinate hypertrophy 05/12/2016   Dysphagia    Allergic rhinitis 01/25/2016   Other and combined forms of senile cataract 07/15/2015   Dyspnea    Left hip pain 04/30/2015   Sciatica 04/30/2015   Knee pain, right 04/30/2015   Bilateral carpal tunnel syndrome 03/04/2015   Hyperlipidemia 03/01/2015   Pulmonary nodules 02/12/2015   External hemorrhoids 02/05/2015   Chronic night sweats 01/08/2015   Atypical chest pain 01/01/2015   Renal insufficiency 07/16/2014   Memory loss 05/22/2014   Peripheral neuropathy 05/22/2014   Abdominal pain 10/26/2013   Headache 10/13/2013   Arthralgia 08/18/2013   ANA positive 07/29/2013    Depression 02/05/2013   Family history of malignant hyperthermia 02/05/2013   Malignant neoplasm of lower-outer quadrant of right breast of female, estrogen receptor positive ([place]) 01/03/2013   Splenic lesion 09/02/2012   Asthma, allergic 08/05/2012   GERD (gastroesophageal reflux disease) 06/03/2012   Edema 04/08/2012   Stricture and stenosis of esophagus 10/19/2011   Functional constipation 07/21/2011   Nonspecific (abnormal) findings on radiological and other examination of biliary tract 07/21/2011   Osteoporosis 04/17/2011   Leg pain 02/24/2011  FIBROCYSTIC BREAST DISEASE, HX OF 10/18/2010   Essential hypertension 08/25/2010   CAD in native artery 08/25/2010   TOBACCO ABUSE, HX OF 08/25/2010   GENERALIZED ANXIETY DISORDER 08/03/2010   Fibromyalgia 01/11/2010    PCP: Mosie Lukes, MD REFERRING PROVIDER: Reed Pandy, MD  REFERRING DIAG: ***  THERAPY DIAG:  Dizziness and giddiness  Chronic bilateral low back pain without sciatica  Unsteadiness on feet  Muscle weakness (generalized)  Cervicalgia  Other low back pain  Cramp and spasm  ONSET DATE: ***  Rationale for Evaluation and Treatment: Rehabilitation  SUBJECTIVE:   SUBJECTIVE STATEMENT: Patient reports she is having blood pressure issues which she thinks she is related to her dizziness.  She also has dizziness rolling over.  She has had vertigo before and tried to do [place] and wasn't successful doing anything.  She fell down her stairs.  She also has orders for thoracic and back pain, she has arthritis in both "bone on bone and I refuse to take pain medication."   Pt accompanied by: self  PERTINENT HISTORY: GERD, acid reflux, chronic neck and back pain, breast cancer (10 years clear), neuropathy,   PAIN:  Are you having pain? Yes: NPRS scale: 7/10 Pain location: neck to both shoulders Pain description: mostly just ache, sometimes sharp, numbness, tingling Aggravating factors: sleeping on 2+ pillows  (needed for breathing Relieving factors: ***  Are you having pain? Yes: NPRS scale: 8-9/10 Pain location: thoracic and low back pain Pain description: ache Aggravating factors: *** Relieving factors: ***  PRECAUTIONS: Fall  WEIGHT BEARING RESTRICTIONS: No  FALLS: Has patient fallen in last 6 months? Yes. Number of falls 1 fell down 5 steps on tailbone, pelvic fracture  LIVING ENVIRONMENT: Lives with: lives with their spouse Lives in: House/apartment Stairs: Yes: Internal: 14+ 15 steps; on right going up, on left going up, and can reach both and External: 2 steps; on right going up Has following equipment at home: Single point cane and Walker - 2 wheeled  PLOF: Independent retired Marine scientist  PATIENT GOALS: relief of pain  OBJECTIVE:   DIAGNOSTIC FINDINGS: ***  COGNITION: Overall cognitive status: {cognition:24006}   SENSATION: {sensation:27233}  EDEMA:  {edema:24020}  MUSCLE TONE:  {LE tone:25568}  DTRs:  {DTR SITE:24025}  POSTURE:  {posture:25561}  Cervical ROM:    Active AROM (deg) eval  Flexion 30  Extension 10  Right lateral flexion 15  Left lateral flexion 15  Right rotation 55  Left rotation 50  (Blank rows = not tested)  STRENGTH: 4+/5 bil UE   LOWER EXTREMITY MMT:   MMT Right eval Left eval  Hip flexion 4+ 4+  Hip abduction 4+ 4+  Hip adduction 4+ 4+  Hip internal rotation    Hip external rotation    Knee flexion 5 5  Knee extension    Ankle dorsiflexion 3+ 3+  Ankle plantarflexion 3+ 3+  Ankle inversion    Ankle eversion    (Blank rows = not tested)  BED MOBILITY:  {Bed mobility:24027}  TRANSFERS: Assistive device utilized: {Assistive devices:23999}  Sit to stand: {Levels of assistance:24026} Stand to sit: {Levels of assistance:24026} Chair to chair: {Levels of assistance:24026} Floor: {Levels of assistance:24026}  RAMP: {Levels of assistance:24026}  CURB: {Levels of assistance:24026}  GAIT: Gait pattern: {gait  characteristics:25376} Distance walked: *** Assistive device utilized: {Assistive devices:23999} Level of assistance: {Levels of assistance:24026} Comments: ***  FUNCTIONAL TESTS:  {Functional tests:24029}  PATIENT SURVEYS:  {rehab surveys:24030}  VESTIBULAR ASSESSMENT:  GENERAL OBSERVATION: ***  SYMPTOM BEHAVIOR:  Subjective history: Started 2 months ago.  She gets dizzy if looks up real fast, gets up to fast, turning (left or right), has almost fallen into doors because misjudges distance  Non-Vestibular symptoms: changes in vision, neck pain, headaches, and nausea/vomiting  Type of dizziness: Blurred Vision, Imbalance (Disequilibrium), Spinning/Vertigo, and Unsteady with head/body turns  Frequency: daily    Duration: couple of minutes  Aggravating factors: Induced by position change: rolling to the right and sit to stand and Induced by motion: looking up at the ceiling, turning body quickly, turning head quickly, and driving  Relieving factors: head stationary, slow movements, and just stand still  Progression of symptoms: worse  OCULOMOTOR EXAM:  Ocular Alignment: normal  Ocular ROM: ***  Spontaneous Nystagmus: absent  Gaze-Induced Nystagmus: age appropriate nystagmus at end range  Smooth Pursuits: saccades  Saccades: hypometric/undershoots  Convergence/Divergence: *** cm   VESTIBULAR - OCULAR REFLEX:   Slow VOR: {slow VOR:25290}  VOR Cancellation: {vor cancellation:25291}  Head-Impulse Test: {head impulse test:25272}  Dynamic Visual Acuity: {dynamic visual acuity:25273}   POSITIONAL TESTING: {Positional tests:25271}  MOTION SENSITIVITY:  Motion Sensitivity Quotient Intensity: 0 = none, 1 = Lightheaded, 2 = Mild, 3 = Moderate, 4 = Severe, 5 = Vomiting  Intensity  1. Sitting to supine   2. Supine to L side   3. Supine to R side   4. Supine to sitting   5. L Hallpike-Dix   6. Up from L    7. R Hallpike-Dix   8. Up from R    9. Sitting, head tipped to L knee    10. Head up from L knee   11. Sitting, head tipped to R knee   12. Head up from R knee   13. Sitting head turns x5   14.Sitting head nods x5   15. In stance, 180 turn to L    16. In stance, 180 turn to R     OTHOSTATICS: {Exam; orthostatics:31331}  FUNCTIONAL GAIT: {Functional tests:24029}   VESTIBULAR TREATMENT:                                                                                                   DATE: ***  Canalith Repositioning:  {Canalith Repositioning:25283} Gaze Adaptation:  {gaze adaptation:25286} Habituation:  {habituation:25288} Other: ***  PATIENT EDUCATION: Education details: *** Person educated: {Person educated:25204} Education method: {Education Method:25205} Education comprehension: {Education Comprehension:25206}  HOME EXERCISE PROGRAM:  GOALS: Goals reviewed with patient? {yes/no:20286}  SHORT TERM GOALS: Target date: ***  *** Baseline: Goal status: {GOALSTATUS:25110}  2.  *** Baseline:  Goal status: {GOALSTATUS:25110}  3.  *** Baseline:  Goal status: {GOALSTATUS:25110}  4.  *** Baseline:  Goal status: {GOALSTATUS:25110}  5.  *** Baseline:  Goal status: {GOALSTATUS:25110}  6.  *** Baseline:  Goal status: {GOALSTATUS:25110}  LONG TERM GOALS: Target date: ***  *** Baseline:  Goal status: {GOALSTATUS:25110}  2.  *** Baseline:  Goal status: {GOALSTATUS:25110}  3.  *** Baseline:  Goal status: {GOALSTATUS:25110}  4.  *** Baseline:  Goal status: {GOALSTATUS:25110}  5.  *** Baseline:  Goal status: {  GOALSTATUS:25110}  6.  *** Baseline:  Goal status: {GOALSTATUS:25110}  ASSESSMENT:  CLINICAL IMPRESSION: Patient is a *** y.o. *** who was seen today for physical therapy evaluation and treatment for ***.   OBJECTIVE IMPAIRMENTS: {opptimpairments:25111}.   ACTIVITY LIMITATIONS: {activitylimitations:27494}  PARTICIPATION LIMITATIONS: {participationrestrictions:25113}  PERSONAL FACTORS: {Personal  factors:25162} are also affecting patient's functional outcome.   REHAB POTENTIAL: {rehabpotential:25112}  CLINICAL DECISION MAKING: {clinical decision making:25114}  EVALUATION COMPLEXITY: {Evaluation complexity:25115}   PLAN:  PT FREQUENCY: {rehab frequency:25116}  PT DURATION: {rehab duration:25117}  PLANNED INTERVENTIONS: {rehab planned interventions:25118::"Therapeutic exercises","Therapeutic activity","Neuromuscular re-education","Balance training","Gait training","Patient/Family education","Self Care","Joint mobilization"}  PLAN FOR NEXT SESSION: ***   Rennie Natter, PT 11/21/2022, 6:28 PM

## 2022-11-23 ENCOUNTER — Ambulatory Visit: Payer: Medicare Other | Admitting: Physical Therapy

## 2022-11-23 ENCOUNTER — Encounter: Payer: Self-pay | Admitting: Physical Therapy

## 2022-11-23 DIAGNOSIS — M5459 Other low back pain: Secondary | ICD-10-CM | POA: Diagnosis not present

## 2022-11-23 DIAGNOSIS — M6281 Muscle weakness (generalized): Secondary | ICD-10-CM | POA: Diagnosis not present

## 2022-11-23 DIAGNOSIS — R252 Cramp and spasm: Secondary | ICD-10-CM | POA: Diagnosis not present

## 2022-11-23 DIAGNOSIS — R42 Dizziness and giddiness: Secondary | ICD-10-CM

## 2022-11-23 DIAGNOSIS — G8929 Other chronic pain: Secondary | ICD-10-CM

## 2022-11-23 DIAGNOSIS — R2681 Unsteadiness on feet: Secondary | ICD-10-CM

## 2022-11-23 DIAGNOSIS — M545 Low back pain, unspecified: Secondary | ICD-10-CM | POA: Diagnosis not present

## 2022-11-23 DIAGNOSIS — M542 Cervicalgia: Secondary | ICD-10-CM | POA: Diagnosis not present

## 2022-11-23 NOTE — Therapy (Signed)
OUTPATIENT PHYSICAL THERAPY TREATMENT   Patient Name: [name] MRN: [id number] DOB:07/13/1946, 76 y.o., female Today's Date: 11/24/2022  END OF SESSION:  PT End of Session - 11/23/22 1535     Visit Number 2    Number of Visits 16    Date for PT Re-Evaluation 01/16/23    Authorization Type BCBS medicare    Progress Note Due on Visit 10    PT Start Time 3500    PT Stop Time 1625    PT Time Calculation (min) 50 min    Equipment Utilized During Treatment Gait belt    Activity Tolerance Patient tolerated treatment well    Behavior During Therapy WFL for tasks assessed/performed              Past Medical History:  Diagnosis Date   Allergy    Anemia    Anxiety    past hx    Arthritis    Asthma    Atrial flutter ([place])    past history- not current   Breast cancer ([place])    CAD (coronary artery disease) CARDIOLOGIST--  DR Angelena Form   mild non-obstructive cad   Cancer ([place])    right   Cataract    bilaterally removed    Chronic constipation    Chronic kidney disease    stage 3 ckd, interstitial cystitis   Concussion    x 3   COPD (chronic obstructive pulmonary disease) ([place])    Depression    past hx    Family history of malignant hyperthermia    father had this   Fibromyalgia    Frequency of urination    GERD (gastroesophageal reflux disease)    H/O hiatal hernia    History of basal cell carcinoma excision    X2   History of breast cancer ONCOLOGIST-- DR Jana Hakim---  NO RECURRANCE   DX 07/2012;  LOW GRADE DCIS  ER+PR+  ----  S/P RIGHT LUMPECTOMY WITH NEGATIVE MARGINS/   RADIATION ENDED 11/2012   History of chronic bronchitis    History of colonic polyps    History of tachycardia    CONTROLLED  WITH ATENOLOL   Hyperlipidemia    Hypertension    Neuromuscular disorder (HCC)    fibromyalgia   Neuropathy    Osteoporosis 01/2019   T score -2.2 stable/improved from prior study   Pelvic pain    Personal history of radiation therapy    S/P radiation therapy  11/12/12 - 12/05/12   right Breast   Sepsis ([place]) 2014   from UTI    Sinus headache    Sjogren's disease ([place])    Urgency of urination    Past Surgical History:  Procedure Laterality Date   8 HOUR [facility] STUDY N/A 04/03/2016   Procedure: 24 HOUR [facility] STUDY;  Surgeon: Manus Gunning, MD;  Location: [facility];  Service: Gastroenterology;  Laterality: N/A;   BREAST BIOPSY Right 08/23/2012   ADH   BREAST BIOPSY Right 10/11/2012   Ductal Carcinoma   BREAST EXCISIONAL BIOPSY Right 09/04/2013   benign   BREAST LUMPECTOMY Right 10-11-2012   W/ SLN BX   CARDIAC CATHETERIZATION  09-13-2007  DR Lia Foyer   WELL-PRESERVED LVF/  DIFFUSE SCATTERED CORONARY CALCIFACATION AND ATHEROSCLEROSIS WITHOUT OBSTRUCTION   CARDIAC CATHETERIZATION  08-04-2010  DR MCALHANY   NON-OBSTRUCTIVE CAD/  pLAD 40%/  oLAD 30%/  mLAD 30%/  pRCA 30%/  EF 60%   CARDIOVASCULAR STRESS TEST  06-18-2012  DR McALHANY   LOW RISK  NUCLEAR STUDY/  SMALL FIXED AREA OF MODERATELY DECREASED UPTAKE IN ANTEROSEPTAL WALL WHICH MAY BE ARTIFACTUAL/  NO ISCHEMIA/  EF 68%   COLONOSCOPY  09-29-2010   CYSTOSCOPY     CYSTOSCOPY WITH HYDRODISTENSION AND BIOPSY N/A 03/06/2014   Procedure: CYSTOSCOPY/HYDRODISTENSION/ INSTILATION OF MARCAINE AND PYRIDIUM;  Surgeon: Ailene Rud, MD;  Location: [facility];  Service: Urology;  Laterality: N/A;   ESOPHAGEAL MANOMETRY N/A 04/03/2016   Procedure: ESOPHAGEAL MANOMETRY (EM);  Surgeon: Manus Gunning, MD;  [facility]: [facility];  Service: Gastroenterology;  Laterality: N/A;   EXTRACORPOREAL SHOCK WAVE LITHOTRIPSY Left 02/06/2019   Procedure: EXTRACORPOREAL SHOCK WAVE LITHOTRIPSY (ESWL);  Surgeon: Kathie Rhodes, MD;  Location: [facility];  Service: Urology;  Laterality: Left;   NASAL SINUS SURGERY  1985   ORIF RIGHT ANKLE  FX  2006   POLYPECTOMY     REMOVAL VOCAL CORD CYST  FEB 2014   RIGHT BREAST BX  08-23-2012   RIGHT HAND SURGERY  X3  LAST ONE 2009   INCLUDES  ORIF  RIGHT 5TH FINGER AND REVISION TWICE   SKIN CANCER EXCISION     TONSILLECTOMY AND ADENOIDECTOMY  AGE 31   TOTAL ABDOMINAL HYSTERECTOMY W/ BILATERAL SALPINGOOPHORECTOMY  1982   W/  APPENDECTOMY   TRANSTHORACIC ECHOCARDIOGRAM  06-24-2012   GRADE I DIASTOLIC DYSFUNCTION/  EF 55-60%/  MILD MR   UPPER GASTROINTESTINAL ENDOSCOPY     Patient Active Problem List   Diagnosis Date Noted   Hyperkalemia 09/28/2022   Anemia 09/28/2022   Tremor 07/05/2022   RUQ pain 07/05/2022   Leukocytosis 07/05/2022   Chronic renal insufficiency 03/21/2022   Sjogren's disease ([place]) 12/12/2021   Vocal cord cyst 05/18/2021   High serum vitamin D 05/18/2021   Dysuria 03/09/2021   Vitamin B12 deficiency 03/08/2021   Preventative health care 03/08/2021   Hyperglycemia 03/08/2021   Low back pain 09/08/2020   OSA and COPD overlap syndrome ([place]) 06/10/2020   Recurrent falls 05/02/2020   Statin intolerance 02/29/2020   RLS (restless legs syndrome) 11/09/2019   Kidney stone 02/26/2019   Recurrent sinusitis 02/25/2019   Hx of colonic polyp 02/25/2019   DDD (degenerative disc disease), cervical 09/17/2018   Degenerative scoliosis 04/22/2018   Primary insomnia 06/25/2017   Chronic interstitial cystitis 02/01/2017   Chronic cough 01/09/2017   Right arm pain 07/20/2016   Deviated septum 05/12/2016   Nasal turbinate hypertrophy 05/12/2016   Dysphagia    Allergic rhinitis 01/25/2016   Other and combined forms of senile cataract 07/15/2015   Dyspnea    Left hip pain 04/30/2015   Sciatica 04/30/2015   Knee pain, right 04/30/2015   Bilateral carpal tunnel syndrome 03/04/2015   Hyperlipidemia 03/01/2015   Pulmonary nodules 02/12/2015   External hemorrhoids 02/05/2015   Chronic night sweats 01/08/2015   Atypical chest pain 01/01/2015   Renal insufficiency 07/16/2014   Memory loss 05/22/2014   Peripheral neuropathy 05/22/2014   Abdominal pain 10/26/2013   Headache 10/13/2013   Arthralgia 08/18/2013   ANA  positive 07/29/2013   Depression 02/05/2013   Family history of malignant hyperthermia 02/05/2013   Malignant neoplasm of lower-outer quadrant of right breast of female, estrogen receptor positive ([place]) 01/03/2013   Splenic lesion 09/02/2012   Asthma, allergic 08/05/2012   GERD (gastroesophageal reflux disease) 06/03/2012   Edema 04/08/2012   Stricture and stenosis of esophagus 10/19/2011   Functional constipation 07/21/2011   Nonspecific (abnormal) findings on radiological and other examination of biliary tract 07/21/2011  Osteoporosis 04/17/2011   Leg pain 02/24/2011   FIBROCYSTIC BREAST DISEASE, HX OF 10/18/2010   Essential hypertension 08/25/2010   CAD in native artery 08/25/2010   TOBACCO ABUSE, HX OF 08/25/2010   GENERALIZED ANXIETY DISORDER 08/03/2010   Fibromyalgia 01/11/2010    PCP: Mosie Lukes, MD  REFERRING PROVIDER:  1) Debbrah Alar NP 2)Whittemore, Stanton Kidney, MD; Ferdie Ping NP  REFERRING DIAG:  1) H81.13 (ICD-10-CM) - Benign paroxysmal positional vertigo due to bilateral vestibular disorder 2) M54.16 lumbar radiculopathy 3) M54.6 Pain in thoracic spine.   THERAPY DIAG:  Dizziness and giddiness  Chronic bilateral low back pain without sciatica  Unsteadiness on feet  Muscle weakness (generalized)  Cervicalgia  Other low back pain  Cramp and spasm  ONSET DATE: chronic neck/back pain, dizziness x 2 months.   Rationale for Evaluation and Treatment: Rehabilitation  SUBJECTIVE:   SUBJECTIVE STATEMENT: Patient feels like she is going to get some help so mentally feeling better.  She also reports 3 recent concussions.  Her head is feeling much better.   No new falls.  Pt accompanied by: self  PERTINENT HISTORY: GERD, acid reflux, chronic neck and back pain, history of breast cancer (10 years clear), neuropathy, osteoporosis, anemia, arthritis, CKD, COPD, fibromyalgia, Sjogren's disease  PAIN:  Are you having pain? Yes: NPRS scale: 6-7/10 Pain  location: neck to both shoulders Pain description: mostly just ache, sometimes sharp, numbness, tingling Aggravating factors: sleeping on 2+ pillows (needed for breathing Relieving factors: none  Are you having pain? Yes: NPRS scale: 7-8/10 Pain location: thoracic and low back pain Pain description: ache Aggravating factors: prolonged sitting, walking, lifting, standing Relieving factors: none  PRECAUTIONS: Fall  WEIGHT BEARING RESTRICTIONS: No  FALLS: Has patient fallen in last 6 months? Yes. Number of falls 1 fell down 5 steps on tailbone, pelvic fracture  LIVING ENVIRONMENT: Lives with: lives with their spouse Lives in: House/apartment Stairs: Yes: Internal: 14+ 15 steps; on right going up, on left going up, and can reach both and External: 2 steps; on right going up Has following equipment at home: Single point cane and Walker - 2 wheeled  PLOF: Independent retired Marine scientist  PATIENT GOALS: relief of pain  OBJECTIVE:   DIAGNOSTIC FINDINGS: 09/02/20 CT Spine Cervical  There is mild multilevel intervertebral disc height loss with associated mild disc osteophyte formation throughout the cervical spine. There are scattered facet hypertrophy. No high-grade spinal canal or neural foraminal narrowing is identified.   COGNITION: Overall cognitive status:  reporting difficulty with short term memory, difficulty with finding words   SENSATION: Not tested  POSTURE:  rounded shoulders, forward head, and increased thoracic kyphosis  Cervical ROM:    Active Tested in sitting AROM (deg) eval  Flexion 30*  Extension 10*  Right lateral flexion 15*  Left lateral flexion 15*  Right rotation 55*  Left rotation 50*  (Blank rows = not tested) *increased pain  STRENGTH: 4+/5 bil UE, increased pain  LOWER EXTREMITY MMT:   MMT (Tested in sitting) Right eval Left eval  Hip flexion 4+ 4+  Hip abduction 4+ 4+  Hip adduction 4+ 4+  Knee flexion 5 5  Knee extension 4+ 4+  Ankle  dorsiflexion 3 3  Ankle plantarflexion 3 3  Ankle inversion 3 3  Ankle eversion 3 3  (Blank rows = not tested)  LUMBAR ROM:   Active  AROM    Flexion   Extension   Right lateral flexion   Left lateral flexion   Right  rotation   Left rotation    (Blank rows = not tested)   PALPATION:  GAIT: Gait pattern: step through pattern Distance walked: 50' Assistive device utilized: Single point cane Level of assistance: Modified independence Comments: decreased gait speed.  Staggers, loses balance, stops with head position changes.   FUNCTIONAL TESTS:  MCTSIB: Condition 1: Avg of 3 trials: 30 sec, Condition 2: Avg of 3 trials: 30 sec, Condition 3: Avg of 3 trials: 0 sec, Condition 4: Avg of 3 trials: 0 sec, and Total Score: 60/120 5x STS 11/23/22= 25 seconds - intermittant UE  support, rapid fatigue, dizziness DGI  11/23/22  6/24 high risk of falls.   PATIENT SURVEYS:  Modified Oswestry 39/50 = 68% severe disability   DHI 88% -severe disability    VESTIBULAR ASSESSMENT:  GENERAL OBSERVATION: Patient has black eye on right, reports bumping it against microwave door.  She was having a headache and frequently wincing and touching face, lights were turned off in exam room to decrease discomfort.  She was obviously in discomfort due to LBP as well, and needed to stand up several times to relieve pain.  She was also distressed when having difficulty finding words, and needed gentle redirection to stay on topic.      SYMPTOM BEHAVIOR:  Subjective history: Started 2 months ago.  She gets dizzy if looks up quickly, gets up too fast, turning (left or right), has almost fallen into doors because misjudges distance.  Rolling over in bed causes severe spinning and nausea.  Non-Vestibular symptoms: changes in vision, neck pain, headaches, and nausea/vomiting  Type of dizziness: Blurred Vision, Imbalance (Disequilibrium), Spinning/Vertigo, and Unsteady with head/body turns  Frequency: daily     Duration: couple of minutes  Aggravating factors: Induced by position change: rolling to the right and sit to stand and Induced by motion: looking up at the ceiling, turning body quickly, turning head quickly, and driving  Relieving factors: head stationary, slow movements, and just stand still  Progression of symptoms: worse  OCULOMOTOR EXAM:  Ocular Alignment: normal  Ocular ROM:  unable to test vertical excursion today due to headache.   Spontaneous Nystagmus: absent  Gaze-Induced Nystagmus: age appropriate nystagmus at end range  Smooth Pursuits: saccades  Saccades: hypometric/undershoots  Convergence/Divergence:  cm   VESTIBULAR - OCULAR REFLEX:    Slow VOR:  saccades.   VOR Cancellation:   Head-Impulse Test: corrective saccades  Dynamic Visual Acuity:  NT   POSITIONAL TESTING: 11/23/22 - no nystagmus or dizziness with positional testing for horizontal and posterior canals.   MOTION SENSITIVITY:  Motion Sensitivity Quotient Intensity: 0 = none, 1 = Lightheaded, 2 = Mild, 3 = Moderate, 4 = Severe, 5 = Vomiting  Intensity  1. Sitting to supine 3  2. Supine to L side   3. Supine to R side 3  4. Supine to sitting   5. L Hallpike-Dix 0  6. Up from L  0  7. R Hallpike-Dix 0  8. Up from R  0  9. Sitting, head tipped to L knee 4  10. Head up from L knee 0  11. Sitting, head tipped to R knee 0  12. Head up from R knee 0  13. Sitting head turns x5 3 - saccades  14.Sitting head nods x5 1- but limited extension  15. In stance, 180 turn to L    16. In stance, 180 turn to R     OTHOSTATICS: 11/23/22  Lying downs - 122/72 HR 74  Sitting - 110/68 HR 74  Standing 110/68 HR 74    TREATMENT:                                                                                                   DATE:   11/24/2022 - Therapeutic Activity:  further assessment of vestibular system including orthostatics, positional testing, DGI. See objective.  Patient education and VOR exercises.    11/22/22- see patient education  PATIENT EDUCATION: Education details: education on plan of care and causes of dizziness.  Person educated: Patient Education method: Explanation Education comprehension: verbalized understanding  HOME EXERCISE PROGRAM:  Access Code: [email or web address] URL: [email or web address] Date: 11/23/2022 Prepared by: Glenetta Hew  Exercises - Seated Gaze Stabilization with Head Rotation  - 3 x daily - 7 x weekly - 3 sets - 10 reps - Seated Gaze Stabilization with Head Nod  - 3 x daily - 7 x weekly - 3 sets - 10 reps   GOALS: Goals reviewed with patient? Yes  SHORT TERM GOALS: Target date: 12/13/2022   Patient will be independent with initial HEP.  Baseline: Too be given Goal status: IN PROGRESS 11/23/22- given VOR exercises. Needs review.   2.  Patient will complete vestibular assessment including DGI  Baseline: not finished Goal status: MET  3.  Patient will complete assessment for neck and back pain.  Baseline: not finished.  Goal status: IN PROGRESS   LONG TERM GOALS: Target date: 01/17/2023   Patient will be independent with advanced/ongoing HEP to improve outcomes and carryover.  Baseline:  Goal status: IN PROGRESS  2.  Patient will report 50% improvement in neck pain to improve QOL.  Baseline:  Goal status: IN PROGRESS  3.  Patient will demonstrate full pain free cervical ROM for safety with driving.  Baseline: see objective  Goal status: IN PROGRESS  4.  Patient will report <50% disability on modified Oswestry to demonstrate improved functional ability.  Baseline: 68% disability  Goal status: IN PROGRESS  5.  Patient will report 18 points improvement on DHI to demonstrate improved dizziness and QOL.  Baseline: 88% disability - severe  Goal status: IN PROGRESS  6. Patient will score at least 19/24 on DGI to decrease risk of falls.     Baseline: TBA Goal status: IN PROGRESS   7. Patient will report 50% improvement  in LBP to improve QOL.  Baseline: severe, constant Goal status: IN PROGRESS  8. Patient will be able to roll over in bed without dizziness.   Baseline: spinning, nausea Goal status: IN PROGRESS  ASSESSMENT:  CLINICAL IMPRESSION: RIDA LOUDIN reports continued dizziness, but improved headache today, so was able to tolerate completion of vestibular assessment.  On [place] she reports 88% impairment which is severe.   She did have a drop in her blood pressure from supine to sitting and standing, which is one of the causes of her dizziness - reviewed recommendations for orthostatic hypotension.  While she has significant dizziness with changes in position and rolling over in bed, she did not have any dizziness or nystagmus with positional testing for BPPV.  When turning her head noted significant impairment in her VOR, with multiple corrective saccades resulting in dizziness.  She also reports 3 recent concussions, but could not recall when these occurred.  Her symptoms suggest a combination of factors causing dizziness including orthostatic hypotension, cervicogenic dizziness, and vestibular hypofunction, and possibly post concussion syndrome and persistent postural perceptual dizziness.  She was given VOR exercises to be performed in sitting for safety.  Will further assess back pain next visit.  BREINDY MEADOW continues to demonstrate potential for improvement and would benefit from continued skilled therapy to address impairments.       OBJECTIVE IMPAIRMENTS: Abnormal gait, decreased balance, decreased endurance, decreased mobility, difficulty walking, decreased ROM, decreased strength, decreased safety awareness, dizziness, increased fascial restrictions, impaired perceived functional ability, increased muscle spasms, impaired sensation, impaired vision/preception, postural dysfunction, and pain.   ACTIVITY LIMITATIONS: carrying, lifting, bending, sitting, standing, squatting, sleeping, stairs, bed  mobility, reach over head, locomotion level, and caring for others  PARTICIPATION LIMITATIONS: meal prep, cleaning, laundry, driving, shopping, and community activity  PERSONAL FACTORS: Age, Time since onset of injury/illness/exacerbation, and 3+ comorbidities: GERD, acid reflux, chronic neck and back pain, history of breast cancer (10 years clear), neuropathy, osteoporosis, anemia, arthritis, CKD, COPD, fibromyalgia, Sjogren's disease  are also affecting patient's functional outcome.   REHAB POTENTIAL: Good  CLINICAL DECISION MAKING: Unstable/unpredictable  EVALUATION COMPLEXITY: High   PLAN:  PT FREQUENCY: 1-2x/week  PT DURATION: 8 weeks  PLANNED INTERVENTIONS: Therapeutic exercises, Therapeutic activity, Neuromuscular re-education, Balance training, Gait training, Patient/Family education, Self Care, Joint mobilization, Stair training, Vestibular training, Canalith repositioning, Dry Needling, Electrical stimulation, Spinal mobilization, Cryotherapy, Moist heat, Traction, Ultrasound, Manual therapy, and Re-evaluation  PLAN FOR NEXT SESSION: assess and treat low back pain.  Interested in dry needling.    Rennie Natter, PT, DPT  11/24/2022, 9:22 AM

## 2022-11-27 ENCOUNTER — Ambulatory Visit: Payer: Self-pay | Admitting: Licensed Clinical Social Worker

## 2022-11-27 NOTE — Patient Instructions (Signed)
Visit Information  Thank you for taking time to visit with me today. Please don't hesitate to contact me if I can be of assistance to you.   Following are the goals we discussed today:   Goals Addressed             This Visit's Progress    Care Coordination Connect for therapy       Care Coordination Interventions: Motivational Interviewing employed Solution-Focused Strategies employed:  Active listening / Reflection utilized  Emotional Support Provided Problem [place] strategies reviewed Provided general psycho-education for mental health needs  Participation in counseling encouraged : has initial appointment with [facility] 12/13/22 Provided EMMI education information on :Depression  ( will review again) Working on Self-Care Goals           Our next appointment is by telephone on 12/04/22 at 9:30  Please call the care guide team at [phone] if you need to cancel or reschedule your appointment.   Patient verbalizes understanding of instructions and care plan provided today and agrees to view in [place]. Active MyChart status and patient understanding of how to access instructions and care plan via MyChart confirmed with patient.      [name], [place] [phone]

## 2022-11-27 NOTE — Patient Outreach (Signed)
  Care Coordination   Follow Up Visit Note   11/27/2022 Name: [name] MRN: [id number] DOB: [birth year]  [name] is a 76 y.o. year old female who sees Mosie Lukes, MD for primary care. I spoke with  [name] by phone today.  What matters to the patients health and wellness today?    Patient is making progress with steps towards managing her mental health. She is looking forward to starting counseling.  .   Recommendation: Patient may benefit from, and is in agreement to To work on self-care goals and review educational information on depression.     Goals Addressed             This Visit's Progress    Care Coordination Connect for therapy       Care Coordination Interventions: Motivational Interviewing employed Solution-Focused Strategies employed:  Active listening / Reflection utilized  Emotional Support Provided Problem [place] strategies reviewed Provided general psycho-education for mental health needs  Participation in counseling encouraged : has initial appointment with [facility] 12/13/22 Provided EMMI education information on :Depression  ( will review again) Working on Self-Care Goals           SDOH assessments and interventions completed:  No   Care Coordination Interventions:  Yes, provided   Follow up plan: Follow up call scheduled for 12/04/22    Encounter Outcome:  Pt. Visit Completed   [name], [place] [phone]

## 2022-11-28 ENCOUNTER — Ambulatory Visit: Payer: Medicare Other | Admitting: Family Medicine

## 2022-11-28 ENCOUNTER — Ambulatory Visit: Payer: Medicare Other | Admitting: Physical Therapy

## 2022-11-28 ENCOUNTER — Ambulatory Visit: Payer: Self-pay

## 2022-11-28 NOTE — Patient Instructions (Addendum)
Visit Information  Thank you for taking time to visit with me today. Please don't hesitate to contact me if I can be of assistance to you.   Following are the goals we discussed today:   Goals Addressed             This Visit's Progress    Health Management       Care Coordination Interventions: Encouraged to continue to attend physical therapy sessions as scheduled Reviewed upcoming appointment and encouraged to attend as scheduled/recommended Encouraged to take medications as prescribed Encouraged patient to check and record blood pressure and follow up with cardiologist per cardiologist recommendation to report readings in 3 weeks Encouraged patient to notify provider if condition worsens or does not improve Encouraged patient to attend Vestibular rehab as scheduled Confirmed patient active clinical pharmacist re: medication assistance Reviewed upcoming appointments and encouraged to attend visits as scheduled.      Our next appointment is by telephone on 12/29/22 at 9:30 am  Please call the care guide team at [phone] if you need to cancel or reschedule your appointment.   If you are experiencing a Mental Health or [place] or need someone to talk to, please call the Suicide and Crisis Lifeline: 988  Patient verbalizes understanding of instructions and care plan provided today and agrees to view in [place]. Active MyChart status and patient understanding of how to access instructions and care plan via MyChart confirmed with patient.     Thea Silversmith, RN, MSN, BSN, [place] Coordinator [phone]

## 2022-11-28 NOTE — Patient Outreach (Signed)
  Care Coordination   Follow Up Visit Note   11/28/2022 Name: MONEA PESANTEZ MRN: [id number] DOB: 1946-11-13  SHEALA DOSH is a 76 y.o. year old female who sees Mosie Lukes, MD for primary care. I spoke with  [name] by phone today.  What matters to the patients health and wellness today?  Mrs. Azpeitia is in a good mood and very positive today. She states she has a new outlook and feeling better. She is reports speaking with LCSW on yesterday who helped change her to a more positive outlook. She has started doing some things for herself. She reports easily looses balance with turns. She states she lost balance and fell hitting the microwave bruising the right side of her face(eye/cheekbone); and another loss of balance falling onto the wall TV.  She reports continues to have very supportive husband and church community. Also acknowledges that clinical pharmacist has helped her tremendously with medication assistance and medication education. She states, "I am doing real good".    Goals Addressed             This Visit's Progress    Health Management       Care Coordination Interventions: Encouraged to continue to attend Vest[facility] rehab as scheduled Reviewed upcoming appointment and encouraged to attend as scheduled/recommended Encouraged to take medications as prescribed Encouraged to notify provider of fall.  Discussed fall prevention and recommend padding corners in the home Encouraged continue to keep track of blood pressure per provider recommendations  Confirmed patient active clinical pharmacist re: medication assistance      SDOH assessments and interventions completed:  No  Care Coordination Interventions:  Yes, provided   Follow up plan: Follow up call scheduled for 12/29/21    Encounter Outcome:  Pt. Visit Completed   Thea Silversmith, RN, MSN, BSN, [place] Coordinator [phone]

## 2022-11-29 ENCOUNTER — Encounter: Payer: Self-pay | Admitting: Family Medicine

## 2022-11-29 ENCOUNTER — Ambulatory Visit: Payer: Medicare Other | Admitting: Family Medicine

## 2022-11-29 ENCOUNTER — Ambulatory Visit (INDEPENDENT_AMBULATORY_CARE_PROVIDER_SITE_OTHER): Payer: Medicare Other | Admitting: Family Medicine

## 2022-11-29 VITALS — BP 118/64 | HR 86 | Temp 97.8°F | Ht 64.0 in | Wt 136.0 lb

## 2022-11-29 DIAGNOSIS — J3489 Other specified disorders of nose and nasal sinuses: Secondary | ICD-10-CM | POA: Diagnosis not present

## 2022-11-29 DIAGNOSIS — H8112 Benign paroxysmal vertigo, left ear: Secondary | ICD-10-CM

## 2022-11-29 DIAGNOSIS — R58 Hemorrhage, not elsewhere classified: Secondary | ICD-10-CM | POA: Diagnosis not present

## 2022-11-29 NOTE — Patient Instructions (Addendum)
OK to take Tylenol 1000 mg (2 extra strength tabs) or 975 mg (3 regular strength tabs) every 6 hours as needed.  Fish oil 3 grams daily for 10 days then 2 grams daily  Vitamin D 4000 IU daily  CoQ10 '200mg'$  daily for headaches  Tart cherry extract any dose at night  To help reduce HEADACHES: Coenzyme Q10 '160mg'$  ONCE DAILY Riboflavin/Vitamin B2 '400mg'$  ONCE DAILY Magnesium oxide 400 mg ONE-TWO TIMES DAILY May stop after headaches are resolved.    Try to drink 55-60 oz of water daily outside of exercise.  Let us know if you need anything.

## 2022-11-29 NOTE — Progress Notes (Signed)
Chief Complaint  Patient presents with   Eye Injury    Patient fell into microwave door due to dizziness    Subjective: Patient is a 76 y.o. female here for eye injury.  Few days ago, the patient was putting soap in a microwave and quickly turned hitting her right eye/face on the door the microwave.  There is a curved plastic hook on the microwave door that latches when it shots.  It scraped her eye.  That pain is getting better.  She does not have any vision changes.  She does continue to have a headache, bruising over her right sinus, and sinus pain/dental pain.  She has taken Tylenol with some relief.  Over the past several years, the patient has been falling due to poor balance.  She will sometimes have dizziness.  She is eating and drinking normally.  She denies any palpitations leading into the episodes.  She is seeing physical therapy to help with her strength.  She is fallen 3 times fairly recently.  When she moves her head she will spin.  No recent medication changes.  Past Medical History:  Diagnosis Date   Allergy    Anemia    Anxiety    past hx    Arthritis    Asthma    Atrial flutter ([place])    past history- not current   Breast cancer ([place])    CAD (coronary artery disease) CARDIOLOGIST--  DR Angelena Form   mild non-obstructive cad   Cancer ([place])    right   Cataract    bilaterally removed    Chronic constipation    Chronic kidney disease    stage 3 ckd, interstitial cystitis   Concussion    x 3   COPD (chronic obstructive pulmonary disease) ([place])    Depression    past hx    Family history of malignant hyperthermia    father had this   Fibromyalgia    Frequency of urination    GERD (gastroesophageal reflux disease)    H/O hiatal hernia    History of basal cell carcinoma excision    X2   History of breast cancer ONCOLOGIST-- DR Jana Hakim---  NO RECURRANCE   DX 07/2012;  LOW GRADE DCIS  ER+PR+  ----  S/P RIGHT LUMPECTOMY WITH NEGATIVE MARGINS/   RADIATION ENDED  11/2012   History of chronic bronchitis    History of colonic polyps    History of tachycardia    CONTROLLED  WITH ATENOLOL   Hyperlipidemia    Hypertension    Neuromuscular disorder ([facility])    fibromyalgia   Neuropathy    Osteoporosis 01/2019   T score -2.2 stable/improved from prior study   Pelvic pain    Personal history of radiation therapy    S/P radiation therapy 11/12/12 - 12/05/12   right Breast   Sepsis ([place]) 2014   from UTI    Sinus headache    Sjogren's disease ([place])    Urgency of urination     Objective: BP 118/64 (BP Location: Left Arm, Patient Position: Sitting, Cuff Size: Normal)   Pulse 86   Temp 97.8 F (36.6 C) (Oral)   Ht '5\' 4"'$  (1.626 m)   Wt 136 lb (61.7 kg)   SpO2 93%   BMI 23.34 kg/m  General: Awake, appears stated age Heart: RRR, no bruits, no murmurs Skin: Ecchymosis noted over the superior lateral portion of the right maxillary sinus Nose: TTP over the right maxillary sinus without fluctuance or deformity Lungs:  CTAB, no rales, wheezes or rhonchi. No accessory muscle use Neuro: DTRs equal and symmetric throughout, no clonus, cerebellar signs, 5/5 strength throughout, Dix-Hallpike positive on the left, did not perform the right due to this. Psych: Age appropriate judgment and insight, normal affect and mood  Assessment and Plan: Benign paroxysmal positional vertigo of left ear  Pain of maxillary sinus  Ecchymosis  She does have a history of this but has not done the Epley maneuver in several years.  Information provided today, suggested she utilize YouTube if no improvement.  She will also bring up with her physical therapy team.  Her neuroexam is unremarkable otherwise.  I do not think further imaging at this time is warranted but if she does not do well with the Epley maneuver, that is something we could consider.  She is already working with physical therapy.  She will follow-up with her regular PCP in 3 weeks. Stay hydrated.  Secondary to  trauma.  Tylenol, ice.  Headache could be from this or mild concussion from hitting her head against the microwave.  Supplements to help with headache due to concussion listed in her AVS. This can take up to a month to resolve. The patient voiced understanding and agreement to the plan.  I spent 30 min with the patient discussing the above plans in addition to reviewing her chart on the same day of the visit.   [place], DO 11/29/22  4:41 PM

## 2022-12-01 ENCOUNTER — Encounter: Payer: Self-pay | Admitting: Physical Therapy

## 2022-12-01 ENCOUNTER — Ambulatory Visit: Payer: Medicare Other | Attending: Family Medicine | Admitting: Physical Therapy

## 2022-12-01 DIAGNOSIS — M545 Low back pain, unspecified: Secondary | ICD-10-CM | POA: Insufficient documentation

## 2022-12-01 DIAGNOSIS — M5459 Other low back pain: Secondary | ICD-10-CM | POA: Diagnosis not present

## 2022-12-01 DIAGNOSIS — G8929 Other chronic pain: Secondary | ICD-10-CM | POA: Insufficient documentation

## 2022-12-01 DIAGNOSIS — H8112 Benign paroxysmal vertigo, left ear: Secondary | ICD-10-CM | POA: Diagnosis not present

## 2022-12-01 DIAGNOSIS — R2681 Unsteadiness on feet: Secondary | ICD-10-CM | POA: Insufficient documentation

## 2022-12-01 DIAGNOSIS — R252 Cramp and spasm: Secondary | ICD-10-CM | POA: Insufficient documentation

## 2022-12-01 DIAGNOSIS — M6281 Muscle weakness (generalized): Secondary | ICD-10-CM | POA: Diagnosis not present

## 2022-12-01 DIAGNOSIS — R42 Dizziness and giddiness: Secondary | ICD-10-CM | POA: Diagnosis not present

## 2022-12-01 DIAGNOSIS — M542 Cervicalgia: Secondary | ICD-10-CM | POA: Insufficient documentation

## 2022-12-01 NOTE — Therapy (Signed)
OUTPATIENT PHYSICAL THERAPY TREATMENT   Patient Name: [name] MRN: [id number] DOB:1946-03-18, 76 y.o., female Today's Date: 12/01/2022  END OF SESSION:  PT End of Session - 12/01/22 0938     Visit Number 3    Number of Visits 16    Date for PT Re-Evaluation 01/16/23    Authorization Type BCBS medicare    Progress Note Due on Visit 10    PT Start Time 0932    Equipment Utilized During Treatment Gait belt    Activity Tolerance Patient tolerated treatment well    Behavior During Therapy WFL for tasks assessed/performed              Past Medical History:  Diagnosis Date   Allergy    Anemia    Anxiety    past hx    Arthritis    Asthma    Atrial flutter ([place])    past history- not current   Breast cancer ([place])    CAD (coronary artery disease) CARDIOLOGIST--  DR Angelena Form   mild non-obstructive cad   Cancer ([place])    right   Cataract    bilaterally removed    Chronic constipation    Chronic kidney disease    stage 3 ckd, interstitial cystitis   Concussion    x 3   COPD (chronic obstructive pulmonary disease) ([place])    Depression    past hx    Family history of malignant hyperthermia    father had this   Fibromyalgia    Frequency of urination    GERD (gastroesophageal reflux disease)    H/O hiatal hernia    History of basal cell carcinoma excision    X2   History of breast cancer ONCOLOGIST-- DR Jana Hakim---  NO RECURRANCE   DX 07/2012;  LOW GRADE DCIS  ER+PR+  ----  S/P RIGHT LUMPECTOMY WITH NEGATIVE MARGINS/   RADIATION ENDED 11/2012   History of chronic bronchitis    History of colonic polyps    History of tachycardia    CONTROLLED  WITH ATENOLOL   Hyperlipidemia    Hypertension    Neuromuscular disorder (HCC)    fibromyalgia   Neuropathy    Osteoporosis 01/2019   T score -2.2 stable/improved from prior study   Pelvic pain    Personal history of radiation therapy    S/P radiation therapy 11/12/12 - 12/05/12   right Breast   Sepsis ([place]) 2014    from UTI    Sinus headache    Sjogren's disease ([place])    Urgency of urination    Past Surgical History:  Procedure Laterality Date   52 HOUR [place] STUDY N/A 04/03/2016   Procedure: 24 HOUR PH STUDY;  Surgeon: Manus Gunning, MD;  Location: [facility];  Service: Gastroenterology;  Laterality: N/A;   BREAST BIOPSY Right 08/23/2012   ADH   BREAST BIOPSY Right 10/11/2012   Ductal Carcinoma   BREAST EXCISIONAL BIOPSY Right 09/04/2013   benign   BREAST LUMPECTOMY Right 10-11-2012   W/ SLN BX   CARDIAC CATHETERIZATION  09-13-2007  DR Lia Foyer   WELL-PRESERVED LVF/  DIFFUSE SCATTERED CORONARY CALCIFACATION AND ATHEROSCLEROSIS WITHOUT OBSTRUCTION   CARDIAC CATHETERIZATION  08-04-2010  DR MCALHANY   NON-OBSTRUCTIVE CAD/  pLAD 40%/  oLAD 30%/  mLAD 30%/  pRCA 30%/  EF 60%   CARDIOVASCULAR STRESS TEST  06-18-2012  DR McALHANY   LOW RISK NUCLEAR STUDY/  SMALL FIXED AREA OF MODERATELY DECREASED UPTAKE IN ANTEROSEPTAL WALL WHICH MAY BE  ARTIFACTUAL/  NO ISCHEMIA/  EF 68%   COLONOSCOPY  09-29-2010   CYSTOSCOPY     CYSTOSCOPY WITH HYDRODISTENSION AND BIOPSY N/A 03/06/2014   Procedure: CYSTOSCOPY/HYDRODISTENSION/ INSTILATION OF MARCAINE AND PYRIDIUM;  Surgeon: Ailene Rud, MD;  Location: [facility];  Service: Urology;  Laterality: N/A;   ESOPHAGEAL MANOMETRY N/A 04/03/2016   Procedure: ESOPHAGEAL MANOMETRY (EM);  Surgeon: Manus Gunning, MD;  [facility];  Service: Gastroenterology;  Laterality: N/A;   EXTRACORPOREAL SHOCK WAVE LITHOTRIPSY Left 02/06/2019   Procedure: EXTRACORPOREAL SHOCK WAVE LITHOTRIPSY (ESWL);  Surgeon: Kathie Rhodes, MD;  Location: [facility];  Service: Urology;  Laterality: Left;   NASAL SINUS SURGERY  1985   ORIF RIGHT ANKLE  FX  2006   POLYPECTOMY     REMOVAL VOCAL CORD CYST  FEB 2014   RIGHT BREAST BX  08-23-2012   RIGHT HAND SURGERY  X3  LAST ONE 2009   INCLUDES  ORIF RIGHT 5TH FINGER AND REVISION TWICE   SKIN CANCER EXCISION      TONSILLECTOMY AND ADENOIDECTOMY  AGE 37   TOTAL ABDOMINAL HYSTERECTOMY W/ BILATERAL SALPINGOOPHORECTOMY  1982   W/  APPENDECTOMY   TRANSTHORACIC ECHOCARDIOGRAM  06-24-2012   GRADE I DIASTOLIC DYSFUNCTION/  EF 55-60%/  MILD MR   UPPER GASTROINTESTINAL ENDOSCOPY     Patient Active Problem List   Diagnosis Date Noted   Hyperkalemia 09/28/2022   Anemia 09/28/2022   Tremor 07/05/2022   RUQ pain 07/05/2022   Leukocytosis 07/05/2022   Chronic renal insufficiency 03/21/2022   Sjogren's disease ([place]) 12/12/2021   Vocal cord cyst 05/18/2021   High serum vitamin D 05/18/2021   Dysuria 03/09/2021   Vitamin B12 deficiency 03/08/2021   Preventative health care 03/08/2021   Hyperglycemia 03/08/2021   Low back pain 09/08/2020   OSA and COPD overlap syndrome ([place]) 06/10/2020   Recurrent falls 05/02/2020   Statin intolerance 02/29/2020   RLS (restless legs syndrome) 11/09/2019   Kidney stone 02/26/2019   Recurrent sinusitis 02/25/2019   Hx of colonic polyp 02/25/2019   DDD (degenerative disc disease), cervical 09/17/2018   Degenerative scoliosis 04/22/2018   Primary insomnia 06/25/2017   Chronic interstitial cystitis 02/01/2017   Chronic cough 01/09/2017   Right arm pain 07/20/2016   Deviated septum 05/12/2016   Nasal turbinate hypertrophy 05/12/2016   Dysphagia    Allergic rhinitis 01/25/2016   Other and combined forms of senile cataract 07/15/2015   Dyspnea    Left hip pain 04/30/2015   Sciatica 04/30/2015   Knee pain, right 04/30/2015   Bilateral carpal tunnel syndrome 03/04/2015   Hyperlipidemia 03/01/2015   Pulmonary nodules 02/12/2015   External hemorrhoids 02/05/2015   Chronic night sweats 01/08/2015   Atypical chest pain 01/01/2015   Renal insufficiency 07/16/2014   Memory loss 05/22/2014   Peripheral neuropathy 05/22/2014   Abdominal pain 10/26/2013   Headache 10/13/2013   Arthralgia 08/18/2013   ANA positive 07/29/2013   Depression 02/05/2013   Family history of  malignant hyperthermia 02/05/2013   Malignant neoplasm of lower-outer quadrant of right breast of female, estrogen receptor positive ([place]) 01/03/2013   Splenic lesion 09/02/2012   Asthma, allergic 08/05/2012   GERD (gastroesophageal reflux disease) 06/03/2012   Edema 04/08/2012   Stricture and stenosis of esophagus 10/19/2011   Functional constipation 07/21/2011   Nonspecific (abnormal) findings on radiological and other examination of biliary tract 07/21/2011   Osteoporosis 04/17/2011   Leg pain 02/24/2011   FIBROCYSTIC BREAST DISEASE, HX OF 10/18/2010  Essential hypertension 08/25/2010   CAD in native artery 08/25/2010   TOBACCO ABUSE, HX OF 08/25/2010   GENERALIZED ANXIETY DISORDER 08/03/2010   Fibromyalgia 01/11/2010    PCP: Mosie Lukes, MD  REFERRING PROVIDER:  1) Debbrah Alar NP 2)Whittemore, Stanton Kidney, MD; Ferdie Ping NP  REFERRING DIAG:  1) H81.13 (ICD-10-CM) - Benign paroxysmal positional vertigo due to bilateral vestibular disorder 2) M54.16 lumbar radiculopathy 3) M54.6 Pain in thoracic spine.   THERAPY DIAG:  Dizziness and giddiness  Chronic bilateral low back pain without sciatica  Unsteadiness on feet  Muscle weakness (generalized)  Cervicalgia  ONSET DATE: chronic neck/back pain, dizziness x 2 months.   Rationale for Evaluation and Treatment: Rehabilitation  SUBJECTIVE:   SUBJECTIVE STATEMENT: Patient saw Dr. Nani Ravens 2 days ago, she had a very positive dix-hallpike on Left, she tried to do epley at home but was not able.  She reports her back is very painful.  Her head is feeling better but still has headache.   Pt accompanied by: self  PERTINENT HISTORY: GERD, acid reflux, chronic neck and back pain, history of breast cancer (10 years clear), neuropathy, osteoporosis, anemia, arthritis, CKD, COPD, fibromyalgia, Sjogren's disease  PAIN:  Are you having pain? Yes: NPRS scale: 6-7/10 Pain location: neck to both shoulders Pain description:  mostly just ache, sometimes sharp, numbness, tingling Aggravating factors: sleeping on 2+ pillows (needed for breathing Relieving factors: none  Are you having pain? Yes: NPRS scale: 7-8/10 Pain location: thoracic and low back pain Pain description: ache Aggravating factors: prolonged sitting, walking, lifting, standing Relieving factors: none  PRECAUTIONS: Fall  WEIGHT BEARING RESTRICTIONS: No  FALLS: Has patient fallen in last 6 months? Yes. Number of falls 1 fell down 5 steps on tailbone, pelvic fracture  LIVING ENVIRONMENT: Lives with: lives with their spouse Lives in: House/apartment Stairs: Yes: Internal: 14+ 15 steps; on right going up, on left going up, and can reach both and External: 2 steps; on right going up Has following equipment at home: Single point cane and Walker - 2 wheeled  PLOF: Independent retired Marine scientist  PATIENT GOALS: relief of pain  OBJECTIVE:   DIAGNOSTIC FINDINGS: 09/02/20 CT Spine Cervical  There is mild multilevel intervertebral disc height loss with associated mild disc osteophyte formation throughout the cervical spine. There are scattered facet hypertrophy. No high-grade spinal canal or neural foraminal narrowing is identified.   COGNITION: Overall cognitive status:  reporting difficulty with short term memory, difficulty with finding words   SENSATION: Not tested  POSTURE:  rounded shoulders, forward head, and increased thoracic kyphosis  Cervical ROM:    Active Tested in sitting AROM (deg) eval  Flexion 30*  Extension 10*  Right lateral flexion 15*  Left lateral flexion 15*  Right rotation 55*  Left rotation 50*  (Blank rows = not tested) *increased pain  STRENGTH: 4+/5 bil UE, increased pain  LOWER EXTREMITY MMT:   MMT (Tested in sitting) Right eval Left eval  Hip flexion 4+ 4+  Hip abduction 4+ 4+  Hip adduction 4+ 4+  Knee flexion 5 5  Knee extension 4+ 4+  Ankle dorsiflexion 3 3  Ankle plantarflexion 3 3  Ankle  inversion 3 3  Ankle eversion 3 3  (Blank rows = not tested)  LUMBAR ROM:   Active  AROM    Flexion To knees, pain radiates R anterior thigh  Extension 75% limited *  Right lateral flexion Mid thigh  Left lateral flexion Mid thigh   Right rotation SNL  Left rotation Sharp pain    (Blank rows = not tested)   PALPATION:  tenderness throughout lumbar spine, QL, R glut and piriformis.   GAIT: Gait pattern: step through pattern Distance walked: 50' Assistive device utilized: Single point cane Level of assistance: Modified independence Comments: decreased gait speed.  Staggers, loses balance, stops with head position changes.   FUNCTIONAL TESTS:  MCTSIB: Condition 1: Avg of 3 trials: 30 sec, Condition 2: Avg of 3 trials: 30 sec, Condition 3: Avg of 3 trials: 0 sec, Condition 4: Avg of 3 trials: 0 sec, and Total Score: 60/120 5x STS 11/23/22= 25 seconds - intermittant UE  support, rapid fatigue, dizziness DGI  11/23/22  6/24 high risk of falls.   PATIENT SURVEYS:  Modified Oswestry 39/50 = 68% severe disability   DHI 88% -severe disability    VESTIBULAR ASSESSMENT:  GENERAL OBSERVATION: Patient has black eye on right, reports bumping it against microwave door.  She was having a headache and frequently wincing and touching face, lights were turned off in exam room to decrease discomfort.  She was obviously in discomfort due to LBP as well, and needed to stand up several times to relieve pain.  She was also distressed when having difficulty finding words, and needed gentle redirection to stay on topic.      SYMPTOM BEHAVIOR:  Subjective history: Started 2 months ago.  She gets dizzy if looks up quickly, gets up too fast, turning (left or right), has almost fallen into doors because misjudges distance.  Rolling over in bed causes severe spinning and nausea.  Non-Vestibular symptoms: changes in vision, neck pain, headaches, and nausea/vomiting  Type of dizziness: Blurred Vision,  Imbalance (Disequilibrium), Spinning/Vertigo, and Unsteady with head/body turns  Frequency: daily    Duration: couple of minutes  Aggravating factors: Induced by position change: rolling to the right and sit to stand and Induced by motion: looking up at the ceiling, turning body quickly, turning head quickly, and driving  Relieving factors: head stationary, slow movements, and just stand still  Progression of symptoms: worse  OCULOMOTOR EXAM:  Ocular Alignment: normal  Ocular ROM:  unable to test vertical excursion today due to headache.   Spontaneous Nystagmus: absent  Gaze-Induced Nystagmus: age appropriate nystagmus at end range  Smooth Pursuits: saccades  Saccades: hypometric/undershoots  Convergence/Divergence:  cm   VESTIBULAR - OCULAR REFLEX:    Slow VOR:  saccades.   VOR Cancellation:   Head-Impulse Test: corrective saccades  Dynamic Visual Acuity:  NT   POSITIONAL TESTING: 11/23/22 - no nystagmus or dizziness with positional testing for horizontal and posterior canals.  12/01/22- Dix Hallpike Left - no nystagmus but did report dizziness and blurred vision with testing position.   MOTION SENSITIVITY:  Motion Sensitivity Quotient Intensity: 0 = none, 1 = Lightheaded, 2 = Mild, 3 = Moderate, 4 = Severe, 5 = Vomiting  Intensity  1. Sitting to supine 3  2. Supine to L side   3. Supine to R side 3  4. Supine to sitting   5. L Hallpike-Dix 0  6. Up from L  0  7. R Hallpike-Dix 0  8. Up from R  0  9. Sitting, head tipped to L knee 4  10. Head up from L knee 0  11. Sitting, head tipped to R knee 0  12. Head up from R knee 0  13. Sitting head turns x5 3 - saccades  14.Sitting head nods x5 1- but limited extension  15. In stance,  180 turn to L    16. In stance, 180 turn to R     OTHOSTATICS: 11/23/22  Lying downs - 122/72 HR 74  Sitting - 110/68 HR 74  Standing 110/68 HR 74    TREATMENT:                                                                                                    DATE:  12/01/2022 Canalith Repositioning: L dix hallpike followed by L epley maneuver x 1.  Reported dizziness throughout maneuver, unable to retest today due to neck pain and dizziness.  To avoid bending over today, and continue sleeping on extra pillows.  Manual Therapy: to decrease muscle spasm and pain and improve mobility STM/TPR to lumbar paraspinals, QL, glutes and glut med.  skilled palpation and monitoring during dry needling. Trigger Point Dry-Needling  Treatment instructions: Expect mild to moderate muscle soreness. Patient verbalized understanding of these instructions and education. Patient Consent Given: Yes Education handout provided: Previously provided Muscles treated: bil lumbar multifidi L2-5, R glut med, R piriformis.  Treatment response/outcome: Twitch Response Elicited and Palpable Increase in Muscle Length   11/24/2022 - Therapeutic Activity:  further assessment of vestibular system including orthostatics, positional testing, DGI. See objective.  Patient education and VOR exercises.   11/22/22- see patient education  PATIENT EDUCATION: Education details: education on plan of care and causes of dizziness.  Person educated: Patient Education method: Explanation Education comprehension: verbalized understanding  HOME EXERCISE PROGRAM:  Access Code: [email or web address] URL: [email or web address] Date: 11/23/2022 Prepared by: Glenetta Hew  Exercises - Seated Gaze Stabilization with Head Rotation  - 3 x daily - 7 x weekly - 3 sets - 10 reps - Seated Gaze Stabilization with Head Nod  - 3 x daily - 7 x weekly - 3 sets - 10 reps   GOALS: Goals reviewed with patient? Yes  SHORT TERM GOALS: Target date: 12/13/2022   Patient will be independent with initial HEP.  Baseline: Too be given Goal status: IN PROGRESS 11/23/22- given VOR exercises. Needs review.   2.  Patient will complete vestibular assessment including DGI  Baseline: not  finished Goal status: MET  3.  Patient will complete assessment for neck and back pain.  Baseline: not finished.  Goal status: IN PROGRESS 12/01/22- back assessed.    LONG TERM GOALS: Target date: 01/17/2023   Patient will be independent with advanced/ongoing HEP to improve outcomes and carryover.  Baseline:  Goal status: IN PROGRESS  2.  Patient will report 50% improvement in neck pain to improve QOL.  Baseline:  Goal status: IN PROGRESS  3.  Patient will demonstrate full pain free cervical ROM for safety with driving.  Baseline: see objective  Goal status: IN PROGRESS  4.  Patient will report <50% disability on modified Oswestry to demonstrate improved functional ability.  Baseline: 68% disability  Goal status: IN PROGRESS  5.  Patient will report 18 points improvement on DHI to demonstrate improved dizziness and QOL.  Baseline: 88% disability - severe  Goal status: IN PROGRESS  6. Patient will score at least 19/24  on DGI to decrease risk of falls.     Baseline: TBA Goal status: IN PROGRESS   7. Patient will report 50% improvement in LBP to improve QOL.  Baseline: severe, constant Goal status: IN PROGRESS  8. Patient will be able to roll over in bed without dizziness.   Baseline: spinning, nausea Goal status: IN PROGRESS  ASSESSMENT:  CLINICAL IMPRESSION: [name] had positive dix hallpike on L at MD office, but was not able to perform Epley at home.  Her back pain is still very severe, so first focused on decreasing pain.  After explanation of DN rational, procedures, outcomes and potential side effects, patient verbalized consent to DN treatment in conjunction with manual STM/DTM and TPR to reduce ttp/muscle tension. Muscles treated as indicated above. DN produced normal response with good twitches elicited resulting in palpable reduction in pain/ttp and muscle tension, with patient noting less pain upon initiation of movement following DN. Pt educated to expect  mild to moderate muscle soreness for up to 24-48 hrs.  After manual therapy performed canalith repositioning - she reported dizziness with dix hallpike this session but did not have any nystagmus.   Noted increased dizziness and instability after repositioning, instructed to hold VOR exercises today and not bend over, continue to sleep with head elevated and will reassess next session.  CP applied to low back in waiting room while resting until dizziness subsided.   KARN DERK continues to demonstrate potential for improvement and would benefit from continued skilled therapy to address impairments.       OBJECTIVE IMPAIRMENTS: Abnormal gait, decreased balance, decreased endurance, decreased mobility, difficulty walking, decreased ROM, decreased strength, decreased safety awareness, dizziness, increased fascial restrictions, impaired perceived functional ability, increased muscle spasms, impaired sensation, impaired vision/preception, postural dysfunction, and pain.   ACTIVITY LIMITATIONS: carrying, lifting, bending, sitting, standing, squatting, sleeping, stairs, bed mobility, reach over head, locomotion level, and caring for others  PARTICIPATION LIMITATIONS: meal prep, cleaning, laundry, driving, shopping, and community activity  PERSONAL FACTORS: Age, Time since onset of injury/illness/exacerbation, and 3+ comorbidities: GERD, acid reflux, chronic neck and back pain, history of breast cancer (10 years clear), neuropathy, osteoporosis, anemia, arthritis, CKD, COPD, fibromyalgia, Sjogren's disease  are also affecting patient's functional outcome.   REHAB POTENTIAL: Good  CLINICAL DECISION MAKING: Unstable/unpredictable  EVALUATION COMPLEXITY: High   PLAN:  PT FREQUENCY: 1-2x/week  PT DURATION: 8 weeks  PLANNED INTERVENTIONS: Therapeutic exercises, Therapeutic activity, Neuromuscular re-education, Balance training, Gait training, Patient/Family education, Self Care, Joint mobilization,  Stair training, Vestibular training, Canalith repositioning, Dry Needling, Electrical stimulation, Spinal mobilization, Cryotherapy, Moist heat, Traction, Ultrasound, Manual therapy, and Re-evaluation  PLAN FOR NEXT SESSION: continue to address vestibular, neck and back pain as tolerated.      Rennie Natter, PT, DPT  12/01/2022, 9:39 AM

## 2022-12-04 ENCOUNTER — Ambulatory Visit: Payer: Self-pay | Admitting: Licensed Clinical Social Worker

## 2022-12-04 NOTE — Patient Instructions (Signed)
Visit Information  Thank you for taking time to visit with me today. Please don't hesitate to contact me if I can be of assistance to you.   Following are the goals we discussed today:   Goals Addressed             This Visit's Progress    Care Coordination Connect for therapy       Care Coordination Interventions: Motivational Interviewing employed Solution-Focused Strategies employed:  Active listening / Reflection utilized  Emotional Support Provided Provided general psycho-education for mental health needs  Participation in counseling encouraged : has initial appointment with [facility] 12/13/22 Provided EMMI education information on :Managing Anxiety Around Daily Tasks and Relieving Stress Worked on Self-Care Goals (completed goals of self-care)  Will continue with Self care goals and engaging with family /friends          Our next appointment is by telephone on 12/21/22 at 9:30  Please call the care guide team at [phone] if you need to cancel or reschedule your appointment.   If you are experiencing a Mental Health or [place] or need someone to talk to, please call the Suicide and Crisis Lifeline: 988 call the [place] National Suicide Prevention Lifeline: 939-717-0809 or TTY: (985) 595-1949 TTY 234-290-4202) to talk to a trained counselor call 1-800-273-TALK (toll free, 24 hour hotline)   Patient verbalizes understanding of instructions and care plan provided today and agrees to view in [place]. Active MyChart status and patient understanding of how to access instructions and care plan via MyChart confirmed with patient.     [name], [place] [phone]

## 2022-12-04 NOTE — Patient Outreach (Signed)
  Care Coordination  Follow Up Visit Note   12/04/2022 Name: [name] MRN: [id number] DOB: 26-May-1946  [name] is a 76 y.o. year old female who sees Mosie Lukes, MD for primary care. I spoke with  [name] by phone today.  What matters to the patients health and wellness today?    Patient is making progress with self-care and behavioral activation for managing her mental health  Recommendation: Patient may benefit from, and is in agreement to :  Keep appointment with Dr. Michail Sermon Continue with Self-Care goals And review EMMI educational information     Goals Addressed             This Visit's Progress    Care Coordination Connect for therapy       Care Coordination Interventions: Motivational Interviewing employed Solution-Focused Strategies employed:  Active listening / Reflection utilized  Emotional Support Provided Provided general psycho-education for mental health needs  Participation in counseling encouraged : has initial appointment with [facility] 12/13/22 Provided EMMI education information on :Managing Anxiety Around Daily Tasks and Relieving Stress Worked on Self-Care Goals (completed goals of self-care)  Will continue with Self care goals and engaging with family /friends          SDOH assessments and interventions completed:  No   Care Coordination Interventions:  Yes, provided   Follow up plan: Follow up call scheduled for 12/21/22    Encounter Outcome:  Pt. Visit Completed   [name], [place] [phone]

## 2022-12-05 ENCOUNTER — Encounter: Payer: Self-pay | Admitting: Emergency Medicine

## 2022-12-05 ENCOUNTER — Ambulatory Visit: Payer: Medicare Other | Admitting: Emergency Medicine

## 2022-12-05 ENCOUNTER — Encounter: Payer: Medicare Other | Admitting: Physical Therapy

## 2022-12-05 VITALS — BP 124/76 | HR 70 | Temp 97.9°F | Ht 64.5 in | Wt 133.8 lb

## 2022-12-05 DIAGNOSIS — J45909 Unspecified asthma, uncomplicated: Secondary | ICD-10-CM

## 2022-12-05 DIAGNOSIS — J309 Allergic rhinitis, unspecified: Secondary | ICD-10-CM

## 2022-12-05 NOTE — Patient Instructions (Addendum)
We will try to stop Flovent.  You will need to avoid exposures that trigger your asthma including cold air, perfumes, air fresheners. Keep your albuterol available to use 2 puffs if needed for shortness of breath, chest tightness, wheezing. Continue your fluticasone nasal spray and loratadine as you have been taking them Continue your Protonix as you have been taking it Follow Dr. Lamonte Sakai in 1 year or sooner if you have any problems or changes with your breathing.

## 2022-12-05 NOTE — Assessment & Plan Note (Signed)
She was able to come off Flovent but recently had recurrence of shortness of breath, flaring symptoms after being exposed to air freshener at home.  She restarted the Flovent 4 days ago, has not used any albuterol.  She remains on a good allergy regimen.  She would like to be off scheduled inhaled medicine if possible.  I think that this may be possible if she can avoid triggers.  We will try stopping the Flovent, use albuterol as needed.  Avoid triggers and continue her allergy routine.  If she has persistent symptoms then we will consider reimaging

## 2022-12-05 NOTE — Assessment & Plan Note (Signed)
Loratadine, Flonase twice daily

## 2022-12-05 NOTE — Progress Notes (Signed)
Subjective:    Patient ID: [name], female    DOB: 14-May-1946, 76 y.o.   MRN: [id number]  HPI 76 year old woman with former tobacco use (10-15 pack years), hypertension, CAD, allergic rhinitis, GERD, breast cancer.  I have seen her for chronic cough and asthma (confirmed by pulmonary function testing 07/2021).  She has a vocal cord hemangioma.  I last saw her in 07/2021 when she was flaring following COVID-19 in March 2022. She is seen by Dr Titus Mould with [facility] - is on plaquenil.   Last seen here 08/2022.  She was having persistent symptoms and was started on Flovent, maintained on her allergy regimen.  Today she reports that she improved, and was able to stop the flovent. Was doing welll until she was exposed to air freshener at home. She restarted the flovent 4 days ago. She has not used any albuterol. She remains on loratadine, flonase bid.    Review of Systems As per HPI  Past Medical History:  Diagnosis Date   Allergy    Anemia    Anxiety    past hx    Arthritis    Asthma    Atrial flutter (HCC)    past history- not current   Breast cancer (HCC)    CAD (coronary artery disease) CARDIOLOGIST--  DR Angelena Form   mild non-obstructive cad   Cancer ([place])    right   Cataract    bilaterally removed    Chronic constipation    Chronic kidney disease    stage 3 ckd, interstitial cystitis   Concussion    x 3   COPD (chronic obstructive pulmonary disease) (HCC)    Depression    past hx    Family history of malignant hyperthermia    father had this   Fibromyalgia    Frequency of urination    GERD (gastroesophageal reflux disease)    H/O hiatal hernia    History of basal cell carcinoma excision    X2   History of breast cancer ONCOLOGIST-- DR Jana Hakim---  NO RECURRANCE   DX 07/2012;  LOW GRADE DCIS  ER+PR+  ----  S/P RIGHT LUMPECTOMY WITH NEGATIVE MARGINS/   RADIATION ENDED 11/2012   History of chronic bronchitis    History of colonic polyps    History of tachycardia     CONTROLLED  WITH ATENOLOL   Hyperlipidemia    Hypertension    Neuromuscular disorder (HCC)    fibromyalgia   Neuropathy    Osteoporosis 01/2019   T score -2.2 stable/improved from prior study   Pelvic pain    Personal history of radiation therapy    S/P radiation therapy 11/12/12 - 12/05/12   right Breast   Sepsis ([place]) 2014   from UTI    Sinus headache    Sjogren's disease ([place])    Urgency of urination      Family History  Problem Relation Age of Onset   Rectal cancer Mother    Colon cancer Mother    Pancreatic cancer Mother    Diabetes Mother    Breast cancer Mother 75   Breast cancer Maternal Aunt        breast   Birth defects Maternal Aunt    Irritable bowel syndrome Son    Heart disease Son        CAD, MV replacement   Allergic Disorder Daughter    Diabetes Daughter    Colon cancer Father    Colon polyps Father    Diabetes  Father    Stroke Father    Heart disease Father        CHF   Hyperlipidemia Father    Hypertension Father    Arthritis Father    Breast cancer Paternal Aunt    Breast cancer Paternal Aunt    Arthritis Paternal Uncle    Allergic Disorder Daughter    Heart disease Cousin        CAD, had a blood clot after stenting   Esophageal cancer Neg Hx    Stomach cancer Neg Hx      Social History   Socioeconomic History   Marital status: Married    Spouse name: Jori Moll    Number of children: 3   Years of education: BA   Highest education level: Not on file  Occupational History   Occupation: Retired Programmer, multimedia: RETIRED  Tobacco Use   Smoking status: Former    Packs/day: 1.00    Years: 15.00    Total pack years: 15.00    Types: Cigarettes    Quit date: 05/27/2005    Years since quitting: 17.5   Smokeless tobacco: Never  Vaping Use   Vaping Use: Never used  Substance and Sexual Activity   Alcohol use: No    Alcohol/week: 0.0 standard drinks of alcohol   Drug use: No   Sexual activity: Not Currently    Partners: Male    Birth  control/protection: Surgical    Comment: 1st intercourse 76 yo-Fewer than 5 partners, hysterectomy  Other Topics Concern   Not on file  Social History Narrative   Lives with husband.   Caffeine use: 1/2 cup per day   Exercise-- 2days a week [employer],  Water aerobics, walking       Retired Marine scientist   No dietary restrictions, tries to maintain a heart healthy diet   Social Determinants of Health   Financial Resource Strain: Low Risk  (02/28/2022)   Overall Financial Resource Strain (CARDIA)    Difficulty of Paying Living Expenses: Not hard at all  Food Insecurity: No Food Insecurity (11/02/2022)   Hunger Vital Sign    Worried About Running Out of Food in the Last Year: Never true    Ran Out of Food in the Last Year: Never true  Transportation Needs: No Transportation Needs (11/02/2022)   PRAPARE - Hydrologist (Medical): No    Lack of Transportation (Non-Medical): No  Physical Activity: Not on file  Stress: Stress Concern Present (11/14/2022)   [place]    Feeling of Stress : To some extent  Social Connections: Socially Integrated (02/28/2022)   Social Connection and Isolation Panel [NHANES]    Frequency of Communication with Friends and Family: More than three times a week    Frequency of Social Gatherings with Friends and Family: More than three times a week    Attends Religious Services: More than 4 times per year    Active Member of [employer] or Organizations: Yes    Attends Music therapist: More than 4 times per year    Marital Status: Married  Human resources officer Violence: Not At Risk (02/28/2022)   Humiliation, Afraid, Rape, and Kick questionnaire    Fear of Current or Ex-Partner: No    Emotionally Abused: No    Physically Abused: No    Sexually Abused: No     Allergies  Allergen Reactions   Clindamycin Rash and Shortness Of Breath  Clindamycin Hcl Shortness Of Breath and Rash    Penicillins Anaphylaxis   Rosuvastatin Anaphylaxis   Baclofen Other (See Comments) and Rash   Lincomycin Other (See Comments)   Prednisone Rash    Other reaction(s): Rash, Hives - can tolerate if she takes with benadryl    Clindamycin Hcl     Other reaction(s): Shortness of Breath, Rash   Codeine Hives and Other (See Comments)    headache Other reaction(s): Headache, Nausea, headache   Erythromycin Hives   Erythromycin Base Other (See Comments)    other   Fluzone [Influenza Virus Vaccine] Other (See Comments)    Local reaction at the site   Haemophilus Influenzae Other (See Comments)    Local reaction at the site Local reaction at the site   Haemophilus Influenzae Vaccines Other (See Comments)    Local reaction at site   Latex Hives   Levofloxacin     Other reaction(s): sick   Other Other (See Comments)    Local reaction at site   Pentazocine Other (See Comments)   Pentazocine Lactate Other (See Comments)    HALLUCINATION   Pneumococcal Vaccine Hives and Swelling    Other reaction(s): Hives, Shortness of Breath   Pneumococcal Vaccine Polyvalent Hives, Swelling and Other (See Comments)    REACTION: redness, swelling, and hives at injection site   Tamoxifen Nausea And Vomiting and Other (See Comments)    HEADACHE Other reaction(s): Headache, Nausea     Outpatient Medications Prior to Visit  Medication Sig Dispense Refill   acetaminophen (TYLENOL) 500 MG tablet Take 1,000 mg by mouth every 6 (six) hours as needed for mild pain or headache.      albuterol (VENTOLIN HFA) 108 (90 Base) MCG/ACT inhaler Inhale 2 puffs into the lungs every 4 (four) hours as needed. 1 each 0   ALPRAZolam (XANAX) 0.25 MG tablet Take 1 tablet by mouth 3 (three) times daily as needed.     amLODipine (NORVASC) 5 MG tablet Take 1 tablet (5 mg total) by mouth daily. 90 tablet 3   aspirin EC 81 MG tablet Take 1 tablet (81 mg total) by mouth daily. 90 tablet 3   atenolol (TENORMIN) 50 MG tablet Take 1  tablet (50 mg total) by mouth 2 (two) times daily. 180 tablet 1   Cyanocobalamin (VITAMIN B-12) 5000 MCG TBDP Take 1 tablet by mouth daily with breakfast.     denosumab (PROLIA) 60 MG/ML SOSY injection Inject 60 mg into the skin every 6 (six) months.     DULoxetine (CYMBALTA) 30 MG capsule Take 1 capsule (30 mg total) by mouth daily. 30 capsule 1   DULoxetine (CYMBALTA) 60 MG capsule Take 1 capsule (60 mg total) by mouth 2 (two) times daily. 180 capsule 1   Evolocumab (REPATHA SURECLICK) 578 MG/ML SOAJ Inject 140 mg into the skin every 14 (fourteen) days. 6 mL 1   fluticasone (FLONASE) 50 MCG/ACT nasal spray Use 2 spray(s) in each nostril once daily 16 g 5   fluticasone (FLOVENT HFA) 110 MCG/ACT inhaler Inhale 2 puffs into the lungs 2 (two) times daily. 1 each 5   furosemide (LASIX) 20 MG tablet Take 1 tablet (20 mg total) by mouth as needed for edema. 90 tablet 3   gabapentin (NEURONTIN) 300 MG capsule Take 1-2 capsules (300-600 mg total) by mouth 3 (three) times daily.     hydroxychloroquine (PLAQUENIL) 200 MG tablet 1 tablet with food or milk     Meth-Hyo-M Bl-Na Phos-Ph Sal (URIBEL) 118  MG CAPS Take by mouth.     Multiple Vitamins-Minerals (MULTIVITAMIN WITH MINERALS) tablet Take 1 tablet by mouth daily.     nitroGLYCERIN (NITROSTAT) 0.4 MG SL tablet Place 1 tablet (0.4 mg total) under the tongue every 5 (five) minutes as needed for chest pain. 25 tablet 1   nystatin cream (MYCOSTATIN) Apply 1 application topically 2 (two) times daily. 30 g 2   pantoprazole (PROTONIX) 40 MG tablet Take 1 tablet (40 mg total) by mouth daily. 90 tablet 1   pentosan polysulfate (ELMIRON) 100 MG capsule Take 1 capsule (100 mg total) by mouth 3 (three) times daily. Reported on 01/05/2016 (Patient taking differently: Take 100 mg by mouth 3 (three) times daily with meals as needed. Reported on 01/05/2016) 90 capsule 11   polyethylene glycol (MIRALAX) 17 g packet Take 17 g by mouth daily. 14 each 0   sucralfate  (CARAFATE) 1 GM/10ML suspension Take 10 mLs (1 g total) by mouth 4 (four) times daily -  with meals and at bedtime. 420 mL 1   traZODone (DESYREL) 100 MG tablet Take 1 tablet (100 mg total) by mouth at bedtime. 90 tablet 0   No facility-administered medications prior to visit.          Objective:   Physical Exam  Vitals:   12/05/22 0840  BP: 124/76  Pulse: 70  Temp: 97.9 F (36.6 C)  TempSrc: Oral  SpO2: 97%  Weight: 133 lb 12.8 oz (60.7 kg)  Height: 5' 4.5" (1.638 m)   Gen: Pleasant, well-nourished, in no distress,  normal affect  ENT: No lesions,  mouth clear,  oropharynx clear, no postnasal drip, somewhat hoarse voice  Neck: No JVD, no stridor  Lungs: No use of accessory muscles, no crackles or wheezing on normal respiration, no wheeze on forced expiration  Cardiovascular: RRR, late systolic murmur with intact S2  Musculoskeletal: No deformities, no cyanosis or clubbing.    Neuro: alert, awake, non focal  Skin: Warm, no lesions or rashes     Assessment & Plan:  Allergic rhinitis She was able to come off Flovent but recently had recurrence of shortness of breath, flaring symptoms after being exposed to air freshener at home.  She restarted the Flovent 4 days ago, has not used any albuterol.  She remains on a good allergy regimen.  She would like to be off scheduled inhaled medicine if possible.  I think that this may be possible if she can avoid triggers.  We will try stopping the Flovent, use albuterol as needed.  Avoid triggers and continue her allergy routine.  If she has persistent symptoms then we will consider reimaging  Asthma, allergic Loratadine, Flonase twice daily  Baltazar Apo, MD, PhD 12/05/2022, 4:45 PM [facility] [phone] or if no answer before 7:00PM call [phone] For any issues after 7:00PM please call eLink [phone]

## 2022-12-08 ENCOUNTER — Ambulatory Visit: Payer: Medicare Other | Admitting: Physical Therapy

## 2022-12-08 ENCOUNTER — Other Ambulatory Visit: Payer: Self-pay | Admitting: Family Medicine

## 2022-12-08 ENCOUNTER — Encounter: Payer: Self-pay | Admitting: Physical Therapy

## 2022-12-08 DIAGNOSIS — M6281 Muscle weakness (generalized): Secondary | ICD-10-CM

## 2022-12-08 DIAGNOSIS — R252 Cramp and spasm: Secondary | ICD-10-CM | POA: Diagnosis not present

## 2022-12-08 DIAGNOSIS — M5459 Other low back pain: Secondary | ICD-10-CM | POA: Diagnosis not present

## 2022-12-08 DIAGNOSIS — M545 Low back pain, unspecified: Secondary | ICD-10-CM | POA: Diagnosis not present

## 2022-12-08 DIAGNOSIS — R2681 Unsteadiness on feet: Secondary | ICD-10-CM | POA: Diagnosis not present

## 2022-12-08 DIAGNOSIS — R42 Dizziness and giddiness: Secondary | ICD-10-CM

## 2022-12-08 DIAGNOSIS — M542 Cervicalgia: Secondary | ICD-10-CM

## 2022-12-08 DIAGNOSIS — G8929 Other chronic pain: Secondary | ICD-10-CM | POA: Diagnosis not present

## 2022-12-08 DIAGNOSIS — H8112 Benign paroxysmal vertigo, left ear: Secondary | ICD-10-CM | POA: Diagnosis not present

## 2022-12-08 NOTE — Therapy (Signed)
OUTPATIENT PHYSICAL THERAPY TREATMENT   Patient Name: [name] MRN: [id number] DOB:1946/07/13, 76 y.o., female Today's Date: 12/08/2022  END OF SESSION:  PT End of Session - 12/08/22 1034     Visit Number 4    Number of Visits 16    Date for PT Re-Evaluation 01/16/23    Authorization Type [employer]BS medicare    Progress Note Due on Visit 10    PT Start Time 1324    PT Stop Time 1102    PT Time Calculation (min) 47 min    Activity Tolerance Patient tolerated treatment well    Behavior During Therapy WFL for tasks assessed/performed              Past Medical History:  Diagnosis Date   Allergy    Anemia    Anxiety    past hx    Arthritis    Asthma    Atrial flutter ([place])    past history- not current   Breast cancer ([place])    CAD (coronary artery disease) CARDIOLOGIST--  DR Angelena Form   mild non-obstructive cad   Cancer ([place])    right   Cataract    bilaterally removed    Chronic constipation    Chronic kidney disease    stage 3 ckd, interstitial cystitis   Concussion    x 3   COPD (chronic obstructive pulmonary disease) ([place])    Depression    past hx    Family history of malignant hyperthermia    father had this   Fibromyalgia    Frequency of urination    GERD (gastroesophageal reflux disease)    H/O hiatal hernia    History of basal cell carcinoma excision    X2   History of breast cancer ONCOLOGIST-- DR Jana Hakim---  NO RECURRANCE   DX 07/2012;  LOW GRADE DCIS  ER+PR+  ----  S/P RIGHT LUMPECTOMY WITH NEGATIVE MARGINS/   RADIATION ENDED 11/2012   History of chronic bronchitis    History of colonic polyps    History of tachycardia    CONTROLLED  WITH ATENOLOL   Hyperlipidemia    Hypertension    Neuromuscular disorder (HCC)    fibromyalgia   Neuropathy    Osteoporosis 01/2019   T score -2.2 stable/improved from prior study   Pelvic pain    Personal history of radiation therapy    S/P radiation therapy 11/12/12 - 12/05/12   right Breast   Sepsis  ([place]) 2014   from UTI    Sinus headache    Sjogren's disease ([place])    Urgency of urination    Past Surgical History:  Procedure Laterality Date   6 HOUR [place] STUDY N/A 04/03/2016   Procedure: 24 HOUR PH STUDY;  Surgeon: Manus Gunning, MD;  Location: [facility];  Service: Gastroenterology;  Laterality: N/A;   BREAST BIOPSY Right 08/23/2012   ADH   BREAST BIOPSY Right 10/11/2012   Ductal Carcinoma   BREAST EXCISIONAL BIOPSY Right 09/04/2013   benign   BREAST LUMPECTOMY Right 10-11-2012   W/ SLN BX   CARDIAC CATHETERIZATION  09-13-2007  DR Lia Foyer   WELL-PRESERVED LVF/  DIFFUSE SCATTERED CORONARY CALCIFACATION AND ATHEROSCLEROSIS WITHOUT OBSTRUCTION   CARDIAC CATHETERIZATION  08-04-2010  DR MCALHANY   NON-OBSTRUCTIVE CAD/  pLAD 40%/  oLAD 30%/  mLAD 30%/  pRCA 30%/  EF 60%   CARDIOVASCULAR STRESS TEST  06-18-2012  DR McALHANY   LOW RISK NUCLEAR STUDY/  SMALL FIXED AREA OF MODERATELY DECREASED  UPTAKE IN ANTEROSEPTAL WALL WHICH MAY BE ARTIFACTUAL/  NO ISCHEMIA/  EF 68%   COLONOSCOPY  09-29-2010   CYSTOSCOPY     CYSTOSCOPY WITH HYDRODISTENSION AND BIOPSY N/A 03/06/2014   Procedure: CYSTOSCOPY/HYDRODISTENSION/ INSTILATION OF MARCAINE AND PYRIDIUM;  Surgeon: Sigmund I Tannenbaum, MD;  Location: [facility];  Service: Urology;  Laterality: N/A;   ESOPHAGEAL MANOMETRY N/A 04/03/2016   Procedure: ESOPHAGEAL MANOMETRY (EM);  Surgeon: Steven Paul Armbruster, MD;  Location: [facility];  Service: Gastroenterology;  Laterality: N/A;   EXTRACORPOREAL SHOCK WAVE LITHOTRIPSY Left 02/06/2019   Procedure: EXTRACORPOREAL SHOCK WAVE LITHOTRIPSY (ESWL);  Surgeon: Ottelin, Mark, MD;  Location: [facility];  Service: Urology;  Laterality: Left;   NASAL SINUS SURGERY  1985   ORIF RIGHT ANKLE  FX  2006   POLYPECTOMY     REMOVAL VOCAL CORD CYST  FEB 2014   RIGHT BREAST BX  08-23-2012   RIGHT HAND SURGERY  X3  LAST ONE 2009   INCLUDES  ORIF RIGHT 5TH FINGER AND REVISION TWICE   SKIN CANCER  EXCISION     TONSILLECTOMY AND ADENOIDECTOMY  AGE 5   TOTAL ABDOMINAL HYSTERECTOMY W/ BILATERAL SALPINGOOPHORECTOMY  1982   W/  APPENDECTOMY   TRANSTHORACIC ECHOCARDIOGRAM  06-24-2012   GRADE I DIASTOLIC DYSFUNCTION/  EF 55-60%/  MILD MR   UPPER GASTROINTESTINAL ENDOSCOPY     Patient Active Problem List   Diagnosis Date Noted   Hyperkalemia 09/28/2022   Anemia 09/28/2022   Tremor 07/05/2022   RUQ pain 07/05/2022   Leukocytosis 07/05/2022   Chronic renal insufficiency 03/21/2022   Sjogren's disease (HCC) 12/12/2021   Vocal cord cyst 05/18/2021   High serum vitamin D 05/18/2021   Dysuria 03/09/2021   Vitamin B12 deficiency 03/08/2021   Preventative health care 03/08/2021   Hyperglycemia 03/08/2021   Low back pain 09/08/2020   OSA and COPD overlap syndrome (HCC) 06/10/2020   Recurrent falls 05/02/2020   Statin intolerance 02/29/2020   RLS (restless legs syndrome) 11/09/2019   Kidney stone 02/26/2019   Recurrent sinusitis 02/25/2019   Hx of colonic polyp 02/25/2019   DDD (degenerative disc disease), cervical 09/17/2018   Degenerative scoliosis 04/22/2018   Primary insomnia 06/25/2017   Chronic interstitial cystitis 02/01/2017   Chronic cough 01/09/2017   Right arm pain 07/20/2016   Deviated septum 05/12/2016   Nasal turbinate hypertrophy 05/12/2016   Dysphagia    Allergic rhinitis 01/25/2016   Other and combined forms of senile cataract 07/15/2015   Dyspnea    Left hip pain 04/30/2015   Sciatica 04/30/2015   Knee pain, right 04/30/2015   Bilateral carpal tunnel syndrome 03/04/2015   Hyperlipidemia 03/01/2015   Pulmonary nodules 02/12/2015   External hemorrhoids 02/05/2015   Chronic night sweats 01/08/2015   Atypical chest pain 01/01/2015   Renal insufficiency 07/16/2014   Memory loss 05/22/2014   Peripheral neuropathy 05/22/2014   Abdominal pain 10/26/2013   Headache 10/13/2013   Arthralgia 08/18/2013   ANA positive 07/29/2013   Depression 02/05/2013   Family  history of malignant hyperthermia 02/05/2013   Malignant neoplasm of lower-outer quadrant of right breast of female, estrogen receptor positive (HCC) 01/03/2013   Splenic lesion 09/02/2012   Asthma, allergic 08/05/2012   GERD (gastroesophageal reflux disease) 06/03/2012   Edema 04/08/2012   Stricture and stenosis of esophagus 10/19/2011   Functional constipation 07/21/2011   Nonspecific (abnormal) findings on radiological and other examination of biliary tract 07/21/2011   Osteoporosis 04/17/2011   Leg pain 02/24/2011     FIBROCYSTIC BREAST DISEASE, HX OF 10/18/2010   Essential hypertension 08/25/2010   CAD in native artery 08/25/2010   TOBACCO ABUSE, HX OF 08/25/2010   GENERALIZED ANXIETY DISORDER 08/03/2010   Fibromyalgia 01/11/2010    PCP: Blyth, Stacey A, MD  REFERRING PROVIDER:  1) O'Sullivan, Melissa NP 2)Whittemore, Mary, MD; 3)Meyran, Kimberly NP  REFERRING DIAG:  1) H81.13 (ICD-10-CM) - Benign paroxysmal positional vertigo due to bilateral vestibular disorder 2) M54.16 lumbar radiculopathy 3) M54.6 Pain in thoracic spine.   THERAPY DIAG:  Dizziness and giddiness  Chronic bilateral low back pain without sciatica  Unsteadiness on feet  Muscle weakness (generalized)  Cervicalgia  Cramp and spasm  ONSET DATE: chronic neck/back pain, dizziness x 2 months.   Rationale for Evaluation and Treatment: Rehabilitation  SUBJECTIVE:   SUBJECTIVE STATEMENT: [name] reports that her back felt a lot better after dry needling, she was good until yesterday, when she was running errands and went to a party.  Still having a lot of neck pain.  Dizziness is mostly when moves too fast, especially standing up.   Working with counseling to improve depression.   Pt accompanied by: self  PERTINENT HISTORY: GERD, acid reflux, chronic neck and back pain, history of breast cancer (10 years clear), neuropathy, osteoporosis, anemia, arthritis, CKD, COPD, fibromyalgia, Sjogren's disease  PAIN:   Are you having pain? Yes: NPRS scale: 6/10 Pain location: neck to both shoulders Pain description: mostly just ache, sometimes sharp, numbness, tingling Aggravating factors: sleeping on 2+ pillows (needed for breathing Relieving factors: none  Are you having pain? Yes: NPRS scale: 8/10 Pain location: thoracic and low back pain Pain description: ache Aggravating factors: prolonged sitting, walking, lifting, standing Relieving factors: none  PRECAUTIONS: Fall  WEIGHT BEARING RESTRICTIONS: No  FALLS: Has patient fallen in last 6 months? Yes. Number of falls 1 fell down 5 steps on tailbone, pelvic fracture  LIVING ENVIRONMENT: Lives with: lives with their spouse Lives in: House/apartment Stairs: Yes: Internal: 14+ 15 steps; on right going up, on left going up, and can reach both and External: 2 steps; on right going up Has following equipment at home: Single point cane and Walker - 2 wheeled  PLOF: Independent retired nurse  PATIENT GOALS: relief of pain  OBJECTIVE:   DIAGNOSTIC FINDINGS: 09/02/20 CT Spine Cervical  There is mild multilevel intervertebral disc height loss with associated mild disc osteophyte formation throughout the cervical spine. There are scattered facet hypertrophy. No high-grade spinal canal or neural foraminal narrowing is identified.   COGNITION: Overall cognitive status:  reporting difficulty with short term memory, difficulty with finding words   SENSATION: Not tested  POSTURE:  rounded shoulders, forward head, and increased thoracic kyphosis  Cervical ROM:    Active Tested in sitting AROM (deg) eval  Flexion 30*  Extension 10*  Right lateral flexion 15*  Left lateral flexion 15*  Right rotation 55*  Left rotation 50*  (Blank rows = not tested) *increased pain  STRENGTH: 4+/5 bil UE, increased pain  LOWER EXTREMITY MMT:   MMT (Tested in sitting) Right eval Left eval  Hip flexion 4+ 4+  Hip abduction 4+ 4+  Hip adduction 4+ 4+   Knee flexion 5 5  Knee extension 4+ 4+  Ankle dorsiflexion 3 3  Ankle plantarflexion 3 3  Ankle inversion 3 3  Ankle eversion 3 3  (Blank rows = not tested)  LUMBAR ROM:   Active  AROM    Flexion To knees, pain radiates R anterior thigh    Extension 75% limited *  Right lateral flexion Mid thigh  Left lateral flexion Mid thigh   Right rotation SNL  Left rotation Sharp pain    (Blank rows = not tested)   PALPATION:  tenderness throughout lumbar spine, QL, R glut and piriformis.   GAIT: Gait pattern: step through pattern Distance walked: 50' Assistive device utilized: Single point cane Level of assistance: Modified independence Comments: decreased gait speed.  Staggers, loses balance, stops with head position changes.   FUNCTIONAL TESTS:  MCTSIB: Condition 1: Avg of 3 trials: 30 sec, Condition 2: Avg of 3 trials: 30 sec, Condition 3: Avg of 3 trials: 0 sec, Condition 4: Avg of 3 trials: 0 sec, and Total Score: 60/120 5x STS 11/23/22= 25 seconds - intermittant UE  support, rapid fatigue, dizziness DGI  11/23/22  6/24 high risk of falls.   PATIENT SURVEYS:  Modified Oswestry 39/50 = 68% severe disability   DHI 88% -severe disability    VESTIBULAR ASSESSMENT:  GENERAL OBSERVATION: Patient has black eye on right, reports bumping it against microwave door.  She was having a headache and frequently wincing and touching face, lights were turned off in exam room to decrease discomfort.  She was obviously in discomfort due to LBP as well, and needed to stand up several times to relieve pain.  She was also distressed when having difficulty finding words, and needed gentle redirection to stay on topic.      SYMPTOM BEHAVIOR:  Subjective history: Started 2 months ago.  She gets dizzy if looks up quickly, gets up too fast, turning (left or right), has almost fallen into doors because misjudges distance.  Rolling over in bed causes severe spinning and nausea.  Non-Vestibular symptoms:  changes in vision, neck pain, headaches, and nausea/vomiting  Type of dizziness: Blurred Vision, Imbalance (Disequilibrium), Spinning/Vertigo, and Unsteady with head/body turns  Frequency: daily    Duration: couple of minutes  Aggravating factors: Induced by position change: rolling to the right and sit to stand and Induced by motion: looking up at the ceiling, turning body quickly, turning head quickly, and driving  Relieving factors: head stationary, slow movements, and just stand still  Progression of symptoms: worse  OCULOMOTOR EXAM:  Ocular Alignment: normal  Ocular ROM:  unable to test vertical excursion today due to headache.   Spontaneous Nystagmus: absent  Gaze-Induced Nystagmus: age appropriate nystagmus at end range  Smooth Pursuits: saccades  Saccades: hypometric/undershoots  Convergence/Divergence:  cm   VESTIBULAR - OCULAR REFLEX:    Slow VOR:  saccades.   VOR Cancellation:   Head-Impulse Test: corrective saccades  Dynamic Visual Acuity:  NT   POSITIONAL TESTING: 11/23/22 - no nystagmus or dizziness with positional testing for horizontal and posterior canals.  12/01/22- Dix Hallpike Left - no nystagmus but did report dizziness and blurred vision with testing position.   MOTION SENSITIVITY:  Motion Sensitivity Quotient Intensity: 0 = none, 1 = Lightheaded, 2 = Mild, 3 = Moderate, 4 = Severe, 5 = Vomiting  Intensity  1. Sitting to supine 3  2. Supine to L side   3. Supine to R side 3  4. Supine to sitting   5. L Hallpike-Dix 0  6. Up from L  0  7. R Hallpike-Dix 0  8. Up from R  0  9. Sitting, head tipped to L knee 4  10. Head up from L knee 0  11. Sitting, head tipped to R knee 0  12. Head up from R knee 0    13. Sitting head turns x5 3 - saccades  14.Sitting head nods x5 1- but limited extension  15. In stance, 180 turn to L    16. In stance, 180 turn to R     OTHOSTATICS: 11/23/22  Lying downs - 122/72 HR 74  Sitting - 110/68 HR 74  Standing 110/68 HR  74    TREATMENT:                                                                                                   DATE:  12/08/2022 Therapeutic Exercise: to improve strength and mobility.   Nustep L4 x 7 min  Manual Therapy: to decrease muscle spasm and pain and improve mobility STM/TPR to bil UT, levator scapulae, and cervical paraspinals.  IASTM to glutes with foam roller, UPA mobs L lumbar spine grade 1-2, skilled palpation and monitoring during dry needling. Trigger Point Dry-Needling  Treatment instructions: Expect mild to moderate muscle soreness. S/S of pneumothorax if dry needled over a lung field, and to seek immediate medical attention should they occur. Patient verbalized understanding of these instructions and education. Patient Consent Given: Yes Education handout provided: Previously provided Muscles treated: bil lumbar multifidi L2-5, R glut med, R piriformis.  Electrical stimulation performed: No Parameters: N/A Treatment response/outcome: Twitch Response Elicited and Palpable Increase in Muscle Length Neuromuscular Reeducation: to improve balance and dizziness.  VOR x 1 exercises - seated, horizontal 3 x 30 sec with rest breaks between, vertical 3 x 30 sec rest breaks between.  No dizziness but does report blurred vision.     12/01/2022 Canalith Repositioning: L dix hallpike followed by L epley maneuver x 1.  Reported dizziness throughout maneuver, unable to retest today due to neck pain and dizziness.  To avoid bending over today, and continue sleeping on extra pillows.  Manual Therapy: to decrease muscle spasm and pain and improve mobility STM/TPR to lumbar paraspinals, QL, glutes and glut med.  skilled palpation and monitoring during dry needling. Trigger Point Dry-Needling  Treatment instructions: Expect mild to moderate muscle soreness. Patient verbalized understanding of these instructions and education. Patient Consent Given: Yes Education handout provided:  Previously provided Muscles treated: bil lumbar multifidi L2-5, R glut med, R piriformis.  Treatment response/outcome: Twitch Response Elicited and Palpable Increase in Muscle Length   11/24/2022 - Therapeutic Activity:  further assessment of vestibular system including orthostatics, positional testing, DGI. See objective.  Patient education and VOR exercises.   PATIENT EDUCATION: Education details: reviewed VOR exercises.  Person educated: Patient Education method: Explanation Education comprehension: verbalized understanding  HOME EXERCISE PROGRAM:  Access Code: [email or web address] URL: [email or web address] Date: 11/23/2022 Prepared by: Elizabeth Whitman  Exercises - Seated Gaze Stabilization with Head Rotation  - 3 x daily - 7 x weekly - 3 sets - 10 reps - Seated Gaze Stabilization with Head Nod  - 3 x daily - 7 x weekly - 3 sets - 10 reps   GOALS: Goals reviewed with patient? Yes  SHORT TERM GOALS: Target date: 12/13/2022   Patient will be independent with initial HEP.  Baseline: Too be given Goal   status: IN PROGRESS 11/23/22- given VOR exercises. Needs review.   2.  Patient will complete vestibular assessment including DGI  Baseline: not finished Goal status: MET  3.  Patient will complete assessment for neck and back pain.  Baseline: not finished.  Goal status: MET 12/01/22- back assessed.    LONG TERM GOALS: Target date: 01/17/2023   Patient will be independent with advanced/ongoing HEP to improve outcomes and carryover.  Baseline:  Goal status: IN PROGRESS  2.  Patient will report 50% improvement in neck pain to improve QOL.  Baseline:  Goal status: IN PROGRESS  3.  Patient will demonstrate full pain free cervical ROM for safety with driving.  Baseline: see objective  Goal status: IN PROGRESS  4.  Patient will report <50% disability on modified Oswestry to demonstrate improved functional ability.  Baseline: 68% disability  Goal status: IN  PROGRESS  5.  Patient will report 18 points improvement on DHI to demonstrate improved dizziness and QOL.  Baseline: 88% disability - severe  Goal status: IN PROGRESS  6. Patient will score at least 19/24 on DGI to decrease risk of falls.     Baseline: TBA Goal status: IN PROGRESS   7. Patient will report 50% improvement in LBP to improve QOL.  Baseline: severe, constant Goal status: IN PROGRESS  8. Patient will be able to roll over in bed without dizziness.   Baseline: spinning, nausea Goal status: IN PROGRESS  ASSESSMENT:  CLINICAL IMPRESSION: [name] reports significant relief for back pain after dry needling last session, and minimal soreness.  She reports dizziness in primarily with fast movements and neck turns.  Neck was worse after Epley maneuver last session, so focused today on neck and back pain, but did review VOR exercises.  She had significant trigger points in cervical paraspinals and suboccipitals, reported decreased pain following interventions.  Dicussed that some of the dizziness may be cervicogenic in nature, especially considering that the tests for BPPV while reproducing dizziness were negative for any nystagmus, so addressing neck pain first then reassessing dizziness may be more productive.  She was in agreement with this plan.    [name] continues to demonstrate potential for improvement and would benefit from continued skilled therapy to address impairments.       OBJECTIVE IMPAIRMENTS: Abnormal gait, decreased balance, decreased endurance, decreased mobility, difficulty walking, decreased ROM, decreased strength, decreased safety awareness, dizziness, increased fascial restrictions, impaired perceived functional ability, increased muscle spasms, impaired sensation, impaired vision/preception, postural dysfunction, and pain.   ACTIVITY LIMITATIONS: carrying, lifting, bending, sitting, standing, squatting, sleeping, stairs, bed mobility, reach over  head, locomotion level, and caring for others  PARTICIPATION LIMITATIONS: meal prep, cleaning, laundry, driving, shopping, and community activity  PERSONAL FACTORS: Age, Time since onset of injury/illness/exacerbation, and 3+ comorbidities: GERD, acid reflux, chronic neck and back pain, history of breast cancer (10 years clear), neuropathy, osteoporosis, anemia, arthritis, CKD, COPD, fibromyalgia, Sjogren's disease  are also affecting patient's functional outcome.   REHAB POTENTIAL: Good  CLINICAL DECISION MAKING: Unstable/unpredictable  EVALUATION COMPLEXITY: High   PLAN:  PT FREQUENCY: 1-2x/week  PT DURATION: 8 weeks  PLANNED INTERVENTIONS: Therapeutic exercises, Therapeutic activity, Neuromuscular re-education, Balance training, Gait training, Patient/Family education, Self Care, Joint mobilization, Stair training, Vestibular training, Canalith repositioning, Dry Needling, Electrical stimulation, Spinal mobilization, Cryotherapy, Moist heat, Traction, Ultrasound, Manual therapy, and Re-evaluation  PLAN FOR NEXT SESSION: Focus on neck and back pain as tolerated.   Add postural exercises - spending long periods at   computer.   Readdress vestibular as neck pain improves to rule out cervicogenic causes.     Rennie Natter, PT, DPT  12/08/2022, 12:14 PM

## 2022-12-11 DIAGNOSIS — M3501 Sicca syndrome with keratoconjunctivitis: Secondary | ICD-10-CM | POA: Diagnosis not present

## 2022-12-11 DIAGNOSIS — M810 Age-related osteoporosis without current pathological fracture: Secondary | ICD-10-CM | POA: Diagnosis not present

## 2022-12-11 DIAGNOSIS — Z79899 Other long term (current) drug therapy: Secondary | ICD-10-CM | POA: Diagnosis not present

## 2022-12-11 DIAGNOSIS — M353 Polymyalgia rheumatica: Secondary | ICD-10-CM | POA: Diagnosis not present

## 2022-12-11 NOTE — Therapy (Signed)
OUTPATIENT PHYSICAL THERAPY TREATMENT   Patient Name: [name] MRN: [id number] DOB:June 24, 1946, 76 y.o., female Today's Date: 12/12/2022  END OF SESSION:  PT End of Session - 12/12/22 1016     Visit Number 5    Number of Visits 16    Date for PT Re-Evaluation 01/16/23    Authorization Type BCBS medicare    Progress Note Due on Visit 10    PT Start Time 1016    PT Stop Time 1100    PT Time Calculation (min) 44 min    Activity Tolerance Patient tolerated treatment well    Behavior During Therapy WFL for tasks assessed/performed              Past Medical History:  Diagnosis Date   Allergy    Anemia    Anxiety    past hx    Arthritis    Asthma    Atrial flutter ([place])    past history- not current   Breast cancer ([place])    CAD (coronary artery disease) CARDIOLOGIST--  DR Angelena Form   mild non-obstructive cad   Cancer ([place])    right   Cataract    bilaterally removed    Chronic constipation    Chronic kidney disease    stage 3 ckd, interstitial cystitis   Concussion    x 3   COPD (chronic obstructive pulmonary disease) (HCC)    Depression    past hx    Family history of malignant hyperthermia    father had this   Fibromyalgia    Frequency of urination    GERD (gastroesophageal reflux disease)    H/O hiatal hernia    History of basal cell carcinoma excision    X2   History of breast cancer ONCOLOGIST-- DR Jana Hakim---  NO RECURRANCE   DX 07/2012;  LOW GRADE DCIS  ER+PR+  ----  S/P RIGHT LUMPECTOMY WITH NEGATIVE MARGINS/   RADIATION ENDED 11/2012   History of chronic bronchitis    History of colonic polyps    History of tachycardia    CONTROLLED  WITH ATENOLOL   Hyperlipidemia    Hypertension    Neuromuscular disorder (HCC)    fibromyalgia   Neuropathy    Osteoporosis 01/2019   T score -2.2 stable/improved from prior study   Pelvic pain    Personal history of radiation therapy    S/P radiation therapy 11/12/12 - 12/05/12   right Breast   Sepsis  ([place]) 2014   from UTI    Sinus headache    Sjogren's disease ([place])    Urgency of urination    Past Surgical History:  Procedure Laterality Date   44 HOUR [employer] [facility] [employer] STUDY N/A 04/03/2016   Procedure: 24 HOUR PH STUDY;  Surgeon: Manus Gunning, MD;  Location: [facility];  Service: Gastroenterology;  Laterality: N/A;   BREAST BIOPSY Right 08/23/2012   ADH   BREAST BIOPSY Right 10/11/2012   Ductal Carcinoma   BREAST EXCISIONAL BIOPSY Right 09/04/2013   benign   BREAST LUMPECTOMY Right 10-11-2012   W/ SLN BX   CARDIAC CATHETERIZATION  09-13-2007  DR Lia Foyer   WELL-PRESERVED LVF/  DIFFUSE SCATTERED CORONARY CALCIFACATION AND ATHEROSCLEROSIS WITHOUT OBSTRUCTION   CARDIAC CATHETERIZATION  08-04-2010  DR MCALHANY   NON-OBSTRUCTIVE CAD/  pLAD 40%/  oLAD 30%/  mLAD 30%/  pRCA 30%/  EF 60%   CARDIOVASCULAR STRESS TEST  06-18-2012  DR McALHANY   LOW RISK NUCLEAR STUDY/  SMALL FIXED AREA OF MODERATELY DECREASED  UPTAKE IN ANTEROSEPTAL WALL WHICH MAY BE ARTIFACTUAL/  NO ISCHEMIA/  EF 68%   COLONOSCOPY  09-29-2010   CYSTOSCOPY     CYSTOSCOPY WITH HYDRODISTENSION AND BIOPSY N/A 03/06/2014   Procedure: CYSTOSCOPY/HYDRODISTENSION/ INSTILATION OF MARCAINE AND PYRIDIUM;  Surgeon: Sigmund I Tannenbaum, MD;  Location: [facility];  Service: Urology;  Laterality: N/A;   ESOPHAGEAL MANOMETRY N/A 04/03/2016   Procedure: ESOPHAGEAL MANOMETRY (EM);  Surgeon: Steven Paul Armbruster, MD;  Location: [facility];  Service: Gastroenterology;  Laterality: N/A;   EXTRACORPOREAL SHOCK WAVE LITHOTRIPSY Left 02/06/2019   Procedure: EXTRACORPOREAL SHOCK WAVE LITHOTRIPSY (ESWL);  Surgeon: Ottelin, Mark, MD;  Location: [facility];  Service: Urology;  Laterality: Left;   NASAL SINUS SURGERY  1985   ORIF RIGHT ANKLE  FX  2006   POLYPECTOMY     REMOVAL VOCAL CORD CYST  FEB 2014   RIGHT BREAST BX  08-23-2012   RIGHT HAND SURGERY  X3  LAST ONE 2009   INCLUDES  ORIF RIGHT 5TH FINGER AND REVISION TWICE   SKIN CANCER  EXCISION     TONSILLECTOMY AND ADENOIDECTOMY  AGE 5   TOTAL ABDOMINAL HYSTERECTOMY W/ BILATERAL SALPINGOOPHORECTOMY  1982   W/  APPENDECTOMY   TRANSTHORACIC ECHOCARDIOGRAM  06-24-2012   GRADE I DIASTOLIC DYSFUNCTION/  EF 55-60%/  MILD MR   UPPER GASTROINTESTINAL ENDOSCOPY     Patient Active Problem List   Diagnosis Date Noted   Hyperkalemia 09/28/2022   Anemia 09/28/2022   Tremor 07/05/2022   RUQ pain 07/05/2022   Leukocytosis 07/05/2022   Chronic renal insufficiency 03/21/2022   Sjogren's disease (HCC) 12/12/2021   Vocal cord cyst 05/18/2021   High serum vitamin D 05/18/2021   Dysuria 03/09/2021   Vitamin B12 deficiency 03/08/2021   Preventative health care 03/08/2021   Hyperglycemia 03/08/2021   Low back pain 09/08/2020   OSA and COPD overlap syndrome (HCC) 06/10/2020   Recurrent falls 05/02/2020   Statin intolerance 02/29/2020   RLS (restless legs syndrome) 11/09/2019   Kidney stone 02/26/2019   Recurrent sinusitis 02/25/2019   Hx of colonic polyp 02/25/2019   DDD (degenerative disc disease), cervical 09/17/2018   Degenerative scoliosis 04/22/2018   Primary insomnia 06/25/2017   Chronic interstitial cystitis 02/01/2017   Chronic cough 01/09/2017   Right arm pain 07/20/2016   Deviated septum 05/12/2016   Nasal turbinate hypertrophy 05/12/2016   Dysphagia    Allergic rhinitis 01/25/2016   Other and combined forms of senile cataract 07/15/2015   Dyspnea    Left hip pain 04/30/2015   Sciatica 04/30/2015   Knee pain, right 04/30/2015   Bilateral carpal tunnel syndrome 03/04/2015   Hyperlipidemia 03/01/2015   Pulmonary nodules 02/12/2015   External hemorrhoids 02/05/2015   Chronic night sweats 01/08/2015   Atypical chest pain 01/01/2015   Renal insufficiency 07/16/2014   Memory loss 05/22/2014   Peripheral neuropathy 05/22/2014   Abdominal pain 10/26/2013   Headache 10/13/2013   Arthralgia 08/18/2013   ANA positive 07/29/2013   Depression 02/05/2013   Family  history of malignant hyperthermia 02/05/2013   Malignant neoplasm of lower-outer quadrant of right breast of female, estrogen receptor positive (HCC) 01/03/2013   Splenic lesion 09/02/2012   Asthma, allergic 08/05/2012   GERD (gastroesophageal reflux disease) 06/03/2012   Edema 04/08/2012   Stricture and stenosis of esophagus 10/19/2011   Functional constipation 07/21/2011   Nonspecific (abnormal) findings on radiological and other examination of biliary tract 07/21/2011   Osteoporosis 04/17/2011   Leg pain 02/24/2011     FIBROCYSTIC BREAST DISEASE, HX OF 10/18/2010   Essential hypertension 08/25/2010   CAD in native artery 08/25/2010   TOBACCO ABUSE, HX OF 08/25/2010   GENERALIZED ANXIETY DISORDER 08/03/2010   Fibromyalgia 01/11/2010    PCP: Mosie Lukes, MD  REFERRING PROVIDER:  1) Debbrah Alar NP 2)Whittemore, Stanton Kidney, MD; Ferdie Ping NP  REFERRING DIAG:  1) H81.13 (ICD-10-CM) - Benign paroxysmal positional vertigo due to bilateral vestibular disorder 2) M54.16 lumbar radiculopathy 3) M54.6 Pain in thoracic spine.   THERAPY DIAG:  Dizziness and giddiness  Chronic bilateral low back pain without sciatica  Unsteadiness on feet  Muscle weakness (generalized)  Cervicalgia  Cramp and spasm  BPPV (benign paroxysmal positional vertigo), left  Other low back pain  ONSET DATE: chronic neck/back pain, dizziness x 2 months.   Rationale for Evaluation and Treatment: Rehabilitation  SUBJECTIVE:   SUBJECTIVE STATEMENT: [name] reports her back is doing much better, and the dizziness in better as well. She is able to move her neck a lot better, still having the headache.  Has pain tip of tailbone, worried about a pilonidal cyst.  Pt accompanied by: self  PERTINENT HISTORY: GERD, acid reflux, chronic neck and back pain, history of breast cancer (10 years clear), neuropathy, osteoporosis, anemia, arthritis, CKD, COPD, fibromyalgia, Sjogren's disease  PAIN:  Are you  having pain? Yes: NPRS scale: 5/10 Pain location: neck to both shoulders Pain description: mostly just ache, sometimes sharp, numbness, tingling Aggravating factors: sleeping on 2+ pillows (needed for breathing Relieving factors: none  Are you having pain? Yes: NPRS scale: 5/10 Pain location: tailbone Pain description: ache Aggravating factors: prolonged sitting, walking, lifting, standing Relieving factors: none  PRECAUTIONS: Fall  WEIGHT BEARING RESTRICTIONS: No  FALLS: Has patient fallen in last 6 months? Yes. Number of falls 1 fell down 5 steps on tailbone, pelvic fracture  LIVING ENVIRONMENT: Lives with: lives with their spouse Lives in: House/apartment Stairs: Yes: Internal: 14+ 15 steps; on right going up, on left going up, and can reach both and External: 2 steps; on right going up Has following equipment at home: Single point cane and Walker - 2 wheeled  PLOF: Independent retired Marine scientist  PATIENT GOALS: relief of pain  OBJECTIVE:   DIAGNOSTIC FINDINGS: 09/02/20 CT Spine Cervical  There is mild multilevel intervertebral disc height loss with associated mild disc osteophyte formation throughout the cervical spine. There are scattered facet hypertrophy. No high-grade spinal canal or neural foraminal narrowing is identified.   COGNITION: Overall cognitive status:  reporting difficulty with short term memory, difficulty with finding words   SENSATION: Not tested  POSTURE:  rounded shoulders, forward head, and increased thoracic kyphosis  Cervical ROM:    Active Tested in sitting AROM (deg) eval  Flexion 30*  Extension 10*  Right lateral flexion 15*  Left lateral flexion 15*  Right rotation 55*  Left rotation 50*  (Blank rows = not tested) *increased pain  STRENGTH: 4+/5 bil UE, increased pain  LOWER EXTREMITY MMT:   MMT (Tested in sitting) Right eval Left eval  Hip flexion 4+ 4+  Hip abduction 4+ 4+  Hip adduction 4+ 4+  Knee flexion 5 5  Knee  extension 4+ 4+  Ankle dorsiflexion 3 3  Ankle plantarflexion 3 3  Ankle inversion 3 3  Ankle eversion 3 3  (Blank rows = not tested)  LUMBAR ROM:   Active  AROM    Flexion To knees, pain radiates R anterior thigh  Extension 75% limited *  Right lateral  flexion Mid thigh  Left lateral flexion Mid thigh   Right rotation SNL  Left rotation Sharp pain    (Blank rows = not tested)   PALPATION:  tenderness throughout lumbar spine, QL, R glut and piriformis.   GAIT: Gait pattern: step through pattern Distance walked: 50' Assistive device utilized: Single point cane Level of assistance: Modified independence Comments: decreased gait speed.  Staggers, loses balance, stops with head position changes.   FUNCTIONAL TESTS:  MCTSIB: Condition 1: Avg of 3 trials: 30 sec, Condition 2: Avg of 3 trials: 30 sec, Condition 3: Avg of 3 trials: 0 sec, Condition 4: Avg of 3 trials: 0 sec, and Total Score: 60/120 5x STS 11/23/22= 25 seconds - intermittant UE  support, rapid fatigue, dizziness DGI  11/23/22  6/24 high risk of falls.   PATIENT SURVEYS:  Modified Oswestry 39/50 = 68% severe disability   DHI 88% -severe disability    VESTIBULAR ASSESSMENT:  GENERAL OBSERVATION: Patient has black eye on right, reports bumping it against microwave door.  She was having a headache and frequently wincing and touching face, lights were turned off in exam room to decrease discomfort.  She was obviously in discomfort due to LBP as well, and needed to stand up several times to relieve pain.  She was also distressed when having difficulty finding words, and needed gentle redirection to stay on topic.      SYMPTOM BEHAVIOR:  Subjective history: Started 2 months ago.  She gets dizzy if looks up quickly, gets up too fast, turning (left or right), has almost fallen into doors because misjudges distance.  Rolling over in bed causes severe spinning and nausea.  Non-Vestibular symptoms: changes in vision, neck pain,  headaches, and nausea/vomiting  Type of dizziness: Blurred Vision, Imbalance (Disequilibrium), Spinning/Vertigo, and Unsteady with head/body turns  Frequency: daily    Duration: couple of minutes  Aggravating factors: Induced by position change: rolling to the right and sit to stand and Induced by motion: looking up at the ceiling, turning body quickly, turning head quickly, and driving  Relieving factors: head stationary, slow movements, and just stand still  Progression of symptoms: worse  OCULOMOTOR EXAM:  Ocular Alignment: normal  Ocular ROM:  unable to test vertical excursion today due to headache.   Spontaneous Nystagmus: absent  Gaze-Induced Nystagmus: age appropriate nystagmus at end range  Smooth Pursuits: saccades  Saccades: hypometric/undershoots  Convergence/Divergence:  cm   VESTIBULAR - OCULAR REFLEX:    Slow VOR:  saccades.   VOR Cancellation:   Head-Impulse Test: corrective saccades  Dynamic Visual Acuity:  NT   POSITIONAL TESTING: 11/23/22 - no nystagmus or dizziness with positional testing for horizontal and posterior canals.  12/01/22- Dix Hallpike Left - no nystagmus but did report dizziness and blurred vision with testing position.   MOTION SENSITIVITY:  Motion Sensitivity Quotient Intensity: 0 = none, 1 = Lightheaded, 2 = Mild, 3 = Moderate, 4 = Severe, 5 = Vomiting  Intensity  1. Sitting to supine 3  2. Supine to L side   3. Supine to R side 3  4. Supine to sitting   5. L Hallpike-Dix 0  6. Up from L  0  7. R Hallpike-Dix 0  8. Up from R  0  9. Sitting, head tipped to L knee 4  10. Head up from L knee 0  11. Sitting, head tipped to R knee 0  12. Head up from R knee 0  13. Sitting head turns x5 3 -  saccades  14.Sitting head nods x5 1- but limited extension  15. In stance, 180 turn to L    16. In stance, 180 turn to R     OTHOSTATICS: 11/23/22  Lying downs - 122/72 HR 74  Sitting - 110/68 HR 74  Standing 110/68 HR 74    TREATMENT:                                                                                                    DATE:  12/12/2022  Manual Therapy: to decrease muscle spasm and pain and improve mobility STM/TPR to bil UT, levator scapulae,  suboccipitals and cervical paraspinals (R>L).   UPA mobs bil lumbar paraspinals, skilled palpation and monitoring during dry needling. Trigger Point Dry-Needling  Treatment instructions: Expect mild to moderate muscle soreness. S/S of pneumothorax if dry needled over a lung field, and to seek immediate medical attention should they occur. Patient verbalized understanding of these instructions and education. Patient Consent Given: Yes Education handout provided: Previously provided Muscles treated: bil lumbar multifidi L2-5 Electrical stimulation performed: No Parameters: N/A Treatment response/outcome: Twitch Response Elicited and Palpable Increase in Muscle Length Ultrasound: x 8 min to R proximal UT 1 MHz, 1.2 w/cm2 cont to decrease inflammation/pain   12/08/2022 Therapeutic Exercise: to improve strength and mobility.   Nustep L4 x 7 min  Manual Therapy: to decrease muscle spasm and pain and improve mobility STM/TPR to bil UT, levator scapulae, and cervical paraspinals.  IASTM to glutes with foam roller, UPA mobs L lumbar parspinals grade 1-2, skilled palpation and monitoring during dry needling. Trigger Point Dry-Needling  Treatment instructions: Expect mild to moderate muscle soreness. S/S of pneumothorax if dry needled over a lung field, and to seek immediate medical attention should they occur. Patient verbalized understanding of these instructions and education. Patient Consent Given: Yes Education handout provided: Previously provided Muscles treated: bil lumbar multifidi L2-5, R glut med, R piriformis.  Electrical stimulation performed: No Parameters: N/A Treatment response/outcome: Twitch Response Elicited and Palpable Increase in Muscle Length Neuromuscular  Reeducation: to improve balance and dizziness.            VOR x 1 exercises - seated, horizontal 3 x 30 sec with rest breaks between, vertical 3 x 30 sec rest breaks between.  No dizziness but does report blurred vision.    12/01/2022 Canalith Repositioning: L dix hallpike followed by L epley maneuver x 1.  Reported dizziness throughout maneuver, unable to retest today due to neck pain and dizziness.  To avoid bending over today, and continue sleeping on extra pillows.  Manual Therapy: to decrease muscle spasm and pain and improve mobility STM/TPR to lumbar paraspinals, QL, glutes and glut med.  skilled palpation and monitoring during dry needling. Trigger Point Dry-Needling  Treatment instructions: Expect mild to moderate muscle soreness. Patient verbalized understanding of these instructions and education. Patient Consent Given: Yes Education handout provided: Previously provided Muscles treated: bil lumbar multifidi L2-5, R glut med, R piriformis.  Treatment response/outcome: Twitch Response Elicited and Palpable Increase in Muscle Length  PATIENT EDUCATION: Education details: continue exercises  Person educated: Patient Education method: Explanation Education comprehension: verbalized understanding  HOME EXERCISE PROGRAM:  Access Code: [email or web address] URL: [email or web address] Date: 11/23/2022 Prepared by: Glenetta Hew  Exercises - Seated Gaze Stabilization with Head Rotation  - 3 x daily - 7 x weekly - 3 sets - 10 reps - Seated Gaze Stabilization with Head Nod  - 3 x daily - 7 x weekly - 3 sets - 10 reps   GOALS: Goals reviewed with patient? Yes  SHORT TERM GOALS: Target date: 12/13/2022   Patient will be independent with initial HEP.  Baseline: Too be given Goal status: MET 11/23/22- given VOR exercises. Needs review. 12/12/22- reports good consistency.   2.  Patient will complete vestibular assessment including DGI  Baseline: not finished Goal status:  MET  3.  Patient will complete assessment for neck and back pain.  Baseline: not finished.  Goal status: MET 12/01/22- back assessed.    LONG TERM GOALS: Target date: 01/17/2023   Patient will be independent with advanced/ongoing HEP to improve outcomes and carryover.  Baseline:  Goal status: IN PROGRESS  2.  Patient will report 50% improvement in neck pain to improve QOL.  Baseline:  Goal status: IN PROGRESS  3.  Patient will demonstrate full pain free cervical ROM for safety with driving.  Baseline: see objective  Goal status: IN PROGRESS  4.  Patient will report <50% disability on modified Oswestry to demonstrate improved functional ability.  Baseline: 68% disability  Goal status: IN PROGRESS  5.  Patient will report 18 points improvement on DHI to demonstrate improved dizziness and QOL.  Baseline: 88% disability - severe  Goal status: IN PROGRESS  6. Patient will score at least 19/24 on DGI to decrease risk of falls.     Baseline: TBA Goal status: IN PROGRESS   7. Patient will report 50% improvement in LBP to improve QOL.  Baseline: severe, constant Goal status: IN PROGRESS  8. Patient will be able to roll over in bed without dizziness.   Baseline: spinning, nausea Goal status: IN PROGRESS  ASSESSMENT:  CLINICAL IMPRESSION: [name] reports dizziness is improving, even in bed now.  She still has a lot of neck pain, feels some of it is her sleeping position as she has to start the night laying on 3 pillows for her breathing and her GERD.  Noted significant tightness still in her R UT/levator scapulae, applied [place] to area afterwards significant improvement in muscle extensibility.   Decreased neck and back pain reported after manual therapy, noted no complaint of dizziness with transfer from prone to sitting today.    [name] continues to demonstrate potential for improvement and would benefit from continued skilled therapy to address impairments.        OBJECTIVE IMPAIRMENTS: Abnormal gait, decreased balance, decreased endurance, decreased mobility, difficulty walking, decreased ROM, decreased strength, decreased safety awareness, dizziness, increased fascial restrictions, impaired perceived functional ability, increased muscle spasms, impaired sensation, impaired vision/preception, postural dysfunction, and pain.   ACTIVITY LIMITATIONS: carrying, lifting, bending, sitting, standing, squatting, sleeping, stairs, bed mobility, reach over head, locomotion level, and caring for others  PARTICIPATION LIMITATIONS: meal prep, cleaning, laundry, driving, shopping, and community activity  PERSONAL FACTORS: Age, Time since onset of injury/illness/exacerbation, and 3+ comorbidities: GERD, acid reflux, chronic neck and back pain, history of breast cancer (10 years clear), neuropathy, osteoporosis, anemia, arthritis, CKD, COPD, fibromyalgia, Sjogren's disease  are also affecting patient's functional outcome.   REHAB POTENTIAL: Good  CLINICAL DECISION MAKING: Unstable/unpredictable  EVALUATION COMPLEXITY: High   PLAN:  PT FREQUENCY: 1-2x/week  PT DURATION: 8 weeks  PLANNED INTERVENTIONS: Therapeutic exercises, Therapeutic activity, Neuromuscular re-education, Balance training, Gait training, Patient/Family education, Self Care, Joint mobilization, Stair training, Vestibular training, Canalith repositioning, Dry Needling, Electrical stimulation, Spinal mobilization, Cryotherapy, Moist heat, Traction, Ultrasound, Manual therapy, and Re-evaluation  PLAN FOR NEXT SESSION: Focus on neck and back pain as tolerated.   Add postural exercises - spending long periods at computer.   Readdress vestibular as neck pain improves to rule out cervicogenic causes.     Rennie Natter, PT, DPT  12/12/2022, 1:00 PM

## 2022-12-12 ENCOUNTER — Ambulatory Visit: Payer: Medicare Other | Admitting: Physical Therapy

## 2022-12-12 DIAGNOSIS — R2681 Unsteadiness on feet: Secondary | ICD-10-CM | POA: Diagnosis not present

## 2022-12-12 DIAGNOSIS — M5459 Other low back pain: Secondary | ICD-10-CM | POA: Diagnosis not present

## 2022-12-12 DIAGNOSIS — R252 Cramp and spasm: Secondary | ICD-10-CM | POA: Diagnosis not present

## 2022-12-12 DIAGNOSIS — M545 Low back pain, unspecified: Secondary | ICD-10-CM | POA: Diagnosis not present

## 2022-12-12 DIAGNOSIS — R42 Dizziness and giddiness: Secondary | ICD-10-CM | POA: Diagnosis not present

## 2022-12-12 DIAGNOSIS — G8929 Other chronic pain: Secondary | ICD-10-CM

## 2022-12-12 DIAGNOSIS — M6281 Muscle weakness (generalized): Secondary | ICD-10-CM

## 2022-12-12 DIAGNOSIS — M542 Cervicalgia: Secondary | ICD-10-CM

## 2022-12-12 DIAGNOSIS — H8112 Benign paroxysmal vertigo, left ear: Secondary | ICD-10-CM

## 2022-12-13 ENCOUNTER — Ambulatory Visit (INDEPENDENT_AMBULATORY_CARE_PROVIDER_SITE_OTHER): Payer: Medicare Other | Admitting: Family

## 2022-12-13 ENCOUNTER — Ambulatory Visit (INDEPENDENT_AMBULATORY_CARE_PROVIDER_SITE_OTHER): Payer: Medicare Other | Admitting: Psychologist

## 2022-12-13 VITALS — BP 120/60 | HR 79 | Temp 97.7°F | Resp 18 | Ht 64.5 in | Wt 136.4 lb

## 2022-12-13 DIAGNOSIS — F330 Major depressive disorder, recurrent, mild: Secondary | ICD-10-CM

## 2022-12-13 DIAGNOSIS — L304 Erythema intertrigo: Secondary | ICD-10-CM

## 2022-12-13 DIAGNOSIS — F411 Generalized anxiety disorder: Secondary | ICD-10-CM | POA: Diagnosis not present

## 2022-12-13 HISTORY — DX: Erythema intertrigo: L30.4

## 2022-12-13 NOTE — Patient Instructions (Signed)
Please apply lotrimin cream twice daily to affected area.  Call if your symptoms worsen or if not improved in 1 week.

## 2022-12-13 NOTE — Progress Notes (Signed)
[place] Counselor Initial Adult Exam  Name: [name] Date: 12/13/2022 MRN: [id number] DOB: [birth year] PCP: Mosie Lukes, MD  Time spent: 01:07 pm to 01:42 pm; total time: 35 minutes  This session was held via phone teletherapy due to the coronavirus risk at this time. The patient consented to phone teletherapy and was located at her home during this session. She is aware it is the responsibility of the patient to secure confidentiality on her end of the session. The provider was in a private home office for the duration of this session. Limits of confidentiality were discussed with the patient.   Guardian/Payee:  NA    Paperwork requested: No   Reason for Visit /Presenting Problem: Depression and anxiety  Mental Status Exam: Appearance:   Well Groomed     Behavior:  Appropriate  Motor:  Normal  Speech/Language:   Clear and Coherent  Affect:  Appropriate  Mood:  normal  Thought process:  normal  Thought content:    WNL  Sensory/Perceptual disturbances:    WNL  Orientation:  oriented to person, place, and time/date  Attention:  Good  Concentration:  Good  Memory:  WNL  Fund of knowledge:   Good  Insight:    Fair  Judgment:   Good  Impulse Control:  Good   Reported Symptoms:  The patient endorsed experiencing the following: feeling down, sad, tearful, social isolation, avoiding pleasurable activities, rumination of thoughts, fatigue, and lack of motivation. She denied suicidal and homicidal ideation.   The patient endorsed experiencing the following: racing thoughts, tremors, feeling on edge, feeling restless, and unable to control worries. She denied suicidal and homicidal ideation.   Risk Assessment: Danger to Self:  No Self-injurious Behavior: No Danger to Others: No Duty to Warn:no Physical Aggression / Violence:No  Access to Firearms a concern: No  Gang Involvement:No  Patient / guardian was educated about steps to take if suicide or  homicide risk level increases between visits: no While future psychiatric events cannot be accurately predicted, the patient does not currently require acute inpatient psychiatric care and does not currently meet [facility] involuntary commitment criteria.  Substance Abuse History: Current substance abuse: No     Past Psychiatric History:   Previous psychological history is significant for depression Outpatient Providers:NA History of Psych Hospitalization: No  Psychological Testing:  NA    Abuse History:  Victim of: Yes.  , emotional   Report needed: No. Victim of Neglect:No. Perpetrator of  NA   Witness / Exposure to Domestic Violence: No   Protective Services Involvement: No  Witness to [employer] Violence:  No   Family History:  Family History  Problem Relation Age of Onset   Rectal cancer Mother    Colon cancer Mother    Pancreatic cancer Mother    Diabetes Mother    Breast cancer Mother 76   Breast cancer Maternal Aunt        breast   Birth defects Maternal Aunt    Irritable bowel syndrome Son    Heart disease Son        CAD, MV replacement   Allergic Disorder Daughter    Diabetes Daughter    Colon cancer Father    Colon polyps Father    Diabetes Father    Stroke Father    Heart disease Father        CHF   Hyperlipidemia Father    Hypertension Father    Arthritis Father    Breast  cancer Paternal Aunt    Breast cancer Paternal Aunt    Arthritis Paternal Uncle    Allergic Disorder Daughter    Heart disease Cousin        CAD, had a blood clot after stenting   Esophageal cancer Neg Hx    Stomach cancer Neg Hx     Living situation: the patient lives with their spouse  Sexual Orientation: Straight  Relationship Status: married  Name of spouse / other:[name]. They have been married 14 years.  If a parent, number of children / ages:Patient has three children. 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 History of legal issue / charges:  NA  Medical History/Surgical History: reviewed Past Medical History:  Diagnosis Date   Allergy    Anemia    Anxiety    past hx    Arthritis    Asthma    Atrial flutter ([place])    past history- not current   Breast cancer ([place])    CAD (coronary artery disease) CARDIOLOGIST--  DR Angelena Form   mild non-obstructive cad   Cancer ([place])    right   Cataract    bilaterally removed    Chronic constipation    Chronic kidney disease    stage 3 ckd, interstitial cystitis   Concussion    x 3   COPD (chronic obstructive pulmonary disease) (HCC)    Depression    past hx    Family history of malignant hyperthermia    father had this   Fibromyalgia    Frequency of urination    GERD (gastroesophageal reflux disease)    H/O hiatal hernia    History of basal cell carcinoma excision    X2   History of breast cancer ONCOLOGIST-- DR Jana Hakim---  NO RECURRANCE   DX 07/2012;  LOW GRADE DCIS  ER+PR+  ----  S/P RIGHT LUMPECTOMY WITH NEGATIVE MARGINS/   RADIATION ENDED 11/2012   History of chronic bronchitis    History of colonic polyps    History of tachycardia    CONTROLLED  WITH ATENOLOL   Hyperlipidemia    Hypertension    Neuromuscular disorder (HCC)    fibromyalgia   Neuropathy    Osteoporosis 01/2019   T score -2.2 stable/improved from prior study   Pelvic pain    Personal history of radiation  therapy    S/P radiation therapy 11/12/12 - 12/05/12   right Breast   Sepsis ([place]) 2014   from UTI    Sinus headache    Sjogren's disease ([place])    Urgency of urination     Past Surgical History:  Procedure Laterality Date   29 HOUR [place] STUDY N/A 04/03/2016   Procedure: 24 HOUR PH STUDY;  Surgeon: Manus Gunning, MD;  Location: [facility];  Service: Gastroenterology;  Laterality: N/A;   BREAST BIOPSY Right 08/23/2012   ADH   BREAST BIOPSY Right 10/11/2012   Ductal Carcinoma   BREAST EXCISIONAL BIOPSY Right  09/04/2013   benign   BREAST LUMPECTOMY Right 10-11-2012   W/ [facility] BX   CARDIAC CATHETERIZATION  09-13-2007  DR Lia Foyer   WELL-PRESERVED LVF/  DIFFUSE SCATTERED CORONARY CALCIFACATION AND ATHEROSCLEROSIS WITHOUT OBSTRUCTION   CARDIAC CATHETERIZATION  08-04-2010  DR MCALHANY   NON-OBSTRUCTIVE CAD/  pLAD 40%/  oLAD 30%/  mLAD 30%/  pRCA 30%/  EF 60%   CARDIOVASCULAR STRESS TEST  06-18-2012  DR McALHANY   LOW RISK NUCLEAR STUDY/  SMALL FIXED AREA OF MODERATELY DECREASED UPTAKE IN ANTEROSEPTAL WALL WHICH MAY BE ARTIFACTUAL/  NO ISCHEMIA/  EF 68%   COLONOSCOPY  09-29-2010   CYSTOSCOPY     CYSTOSCOPY WITH HYDRODISTENSION AND BIOPSY N/A 03/06/2014   Procedure: CYSTOSCOPY/HYDRODISTENSION/ INSTILATION OF MARCAINE AND PYRIDIUM;  Surgeon: Ailene Rud, MD;  Location: [facility];  Service: Urology;  Laterality: N/A;   ESOPHAGEAL MANOMETRY N/A 04/03/2016   Procedure: ESOPHAGEAL MANOMETRY (EM);  Surgeon: Manus Gunning, MD;  Location: [facility];  Service: Gastroenterology;  Laterality: N/A;   EXTRACORPOREAL SHOCK WAVE LITHOTRIPSY Left 02/06/2019   Procedure: EXTRACORPOREAL SHOCK WAVE LITHOTRIPSY (ESWL);  Surgeon: Kathie Rhodes, MD;  Location: [facility];  Service: Urology;  Laterality: Left;   NASAL SINUS SURGERY  1985   ORIF RIGHT ANKLE  FX  2006   POLYPECTOMY     REMOVAL VOCAL CORD CYST  FEB 2014   RIGHT BREAST BX  08-23-2012   RIGHT HAND SURGERY  X3   LAST ONE 2009   INCLUDES  ORIF RIGHT 5TH FINGER AND REVISION TWICE   SKIN CANCER EXCISION     TONSILLECTOMY AND ADENOIDECTOMY  AGE 11   TOTAL ABDOMINAL HYSTERECTOMY W/ BILATERAL SALPINGOOPHORECTOMY  1982   W/  APPENDECTOMY   TRANSTHORACIC ECHOCARDIOGRAM  06-24-2012   GRADE I DIASTOLIC DYSFUNCTION/  EF 55-60%/  MILD MR   UPPER GASTROINTESTINAL ENDOSCOPY      Medications: Current Outpatient Medications  Medication Sig Dispense Refill   acetaminophen (TYLENOL) 500 MG tablet Take 1,000 mg by mouth every 6 (six) hours as needed for mild pain or headache.      albuterol (VENTOLIN HFA) 108 (90 Base) MCG/ACT inhaler Inhale 2 puffs into the lungs every 4 (four) hours as needed. 1 each 0   ALPRAZolam (XANAX) 0.25 MG tablet Take 1 tablet by mouth 3 (three) times daily as needed.     amLODipine (NORVASC) 5 MG tablet Take 1 tablet (5 mg total) by mouth daily. 90 tablet 3   aspirin EC 81 MG tablet Take 1 tablet (81 mg total) by mouth daily. 90 tablet 3   atenolol (TENORMIN) 50 MG tablet Take 1 tablet (50 mg total) by mouth 2 (two) times daily. 180 tablet 1   Cyanocobalamin (VITAMIN B-12) 5000 MCG TBDP Take 1 tablet by mouth daily with breakfast.     denosumab (PROLIA) 60 MG/ML SOSY injection Inject 60 mg into the skin every 6 (six) months.     DULoxetine (CYMBALTA) 30 MG capsule Take 1 capsule (30 mg total) by mouth daily. 30 capsule 1   DULoxetine (CYMBALTA) 60 MG capsule Take 1 capsule (60 mg total) by mouth 2 (two) times daily. 180 capsule 1   Evolocumab (REPATHA SURECLICK) 902 MG/ML SOAJ Inject 140 mg into the skin every 14 (fourteen) days. 6 mL 1   fluticasone (FLONASE) 50 MCG/ACT nasal spray Use 2 spray(s) in each nostril once daily 16 g 5   fluticasone (FLOVENT HFA) 110 MCG/ACT inhaler Inhale 2 puffs into the lungs 2 (two)  times daily. 1 each 5   furosemide (LASIX) 20 MG tablet Take 1 tablet (20 mg total) by mouth as needed for edema. 90 tablet 3   gabapentin (NEURONTIN) 300 MG capsule Take 1-2  capsules (300-600 mg total) by mouth 3 (three) times daily.     hydroxychloroquine (PLAQUENIL) 200 MG tablet 1 tablet with food or milk     Meth-Hyo-M Bl-Na Phos-Ph Sal (URIBEL) 118 MG CAPS Take by mouth.     Multiple Vitamins-Minerals (MULTIVITAMIN WITH MINERALS) tablet Take 1 tablet by mouth daily.     nitroGLYCERIN (NITROSTAT) 0.4 MG SL tablet Place 1 tablet (0.4 mg total) under the tongue every 5 (five) minutes as needed for chest pain. 25 tablet 1   nystatin cream (MYCOSTATIN) Apply 1 application topically 2 (two) times daily. 30 g 2   pantoprazole (PROTONIX) 40 MG tablet Take 1 tablet (40 mg total) by mouth daily. 90 tablet 1   pentosan polysulfate (ELMIRON) 100 MG capsule Take 1 capsule (100 mg total) by mouth 3 (three) times daily. Reported on 01/05/2016 (Patient taking differently: Take 100 mg by mouth 3 (three) times daily with meals as needed. Reported on 01/05/2016) 90 capsule 11   polyethylene glycol (MIRALAX) 17 g packet Take 17 g by mouth daily. 14 each 0   sucralfate (CARAFATE) 1 GM/10ML suspension Take 10 mLs (1 g total) by mouth 4 (four) times daily -  with meals and at bedtime. 420 mL 1   traZODone (DESYREL) 100 MG tablet Take 1 tablet (100 mg total) by mouth at bedtime. 90 tablet 1   No current facility-administered medications for this visit.    Allergies  Allergen Reactions   Clindamycin Rash and Shortness Of Breath   Clindamycin Hcl Shortness Of Breath and Rash   Penicillins Anaphylaxis   Rosuvastatin Anaphylaxis   Baclofen Other (See Comments) and Rash   Lincomycin Other (See Comments)   Prednisone Rash    Other reaction(s): Rash, Hives - can tolerate if she takes with benadryl    Clindamycin Hcl     Other reaction(s): Shortness of Breath, Rash   Codeine Hives and Other (See Comments)    headache Other reaction(s): Headache, Nausea, headache   Erythromycin Hives   Erythromycin Base Other (See Comments)    other   Fluzone [Influenza Virus Vaccine] Other (See  Comments)    Local reaction at the site   Haemophilus Influenzae Other (See Comments)    Local reaction at the site Local reaction at the site   Haemophilus Influenzae Vaccines Other (See Comments)    Local reaction at site   Latex Hives   Levofloxacin     Other reaction(s): sick   Other Other (See Comments)    Local reaction at site   Pentazocine Other (See Comments)   Pentazocine Lactate Other (See Comments)    HALLUCINATION   Pneumococcal Vaccine Hives and Swelling    Other reaction(s): Hives, Shortness of Breath   Pneumococcal Vaccine Polyvalent Hives, Swelling and Other (See Comments)    REACTION: redness, swelling, and hives at injection site   Tamoxifen Nausea And Vomiting and Other (See Comments)    HEADACHE Other reaction(s): Headache, Nausea    Diagnoses:  F33.0 major depressive affective disorder, recurrent, mild and F41.1 generalized anxiety disorder  Plan of Care: The patient is a 58 Caucasian female who was referred due to experiencing depression and anxiety. The patient lives at home with her husband. The patient meets criteria for a diagnosis of F33.0 major depressive  affective disorder, recurrent, mild based off of the following: feeling down, sad, tearful, social isolation, avoiding pleasurable activities, rumination of thoughts, fatigue, and lack of motivation. She denied suicidal and homicidal ideation. The patient meets criteria for a diagnosis of F41.1 generalized anxiety disorder based off of the following: racing thoughts, tremors, feeling on edge, feeling restless, and unable to control worries. She denied suicidal and homicidal ideation.   The patient stated that she wants to get back to the activities she once was doing  This psychologist makes the recommendation that the patient participate in therapy at least once a month.    Conception Chancy, PsyD

## 2022-12-13 NOTE — Assessment & Plan Note (Signed)
New. Trial of lotrisone cream. Pt  is advised to call if symptoms worsen or if not improved in 1 week.

## 2022-12-13 NOTE — Progress Notes (Signed)
Subjective:     Patient ID: [name], female    DOB: 1946-01-15, 76 y.o.   MRN: [id number]  Chief Complaint  Patient presents with   skin check    Area on lower back -- warmth , tender , redness    HPI Patient is in today for skin problem. States that she developed drainage at the top of her gluteal fold. Area is tender and started about 5 days ago.    Health Maintenance Due  Topic Date Due   Diabetic kidney evaluation - Urine ACR  07/10/2014   COVID-19 Vaccine (5 - 2023-24 season) 08/25/2022    Past Medical History:  Diagnosis Date   Allergy    Anemia    Anxiety    past hx    Arthritis    Asthma    Atrial flutter ([place])    past history- not current   Breast cancer ([place])    CAD (coronary artery disease) CARDIOLOGIST--  DR Angelena Form   mild non-obstructive cad   Cancer ([place])    right   Cataract    bilaterally removed    Chronic constipation    Chronic kidney disease    stage 3 ckd, interstitial cystitis   Concussion    x 3   COPD (chronic obstructive pulmonary disease) (HCC)    Depression    past hx    Family history of malignant hyperthermia    father had this   Fibromyalgia    Frequency of urination    GERD (gastroesophageal reflux disease)    H/O hiatal hernia    History of basal cell carcinoma excision    X2   History of breast cancer ONCOLOGIST-- DR Jana Hakim---  NO RECURRANCE   DX 07/2012;  LOW GRADE DCIS  ER+PR+  ----  S/P RIGHT LUMPECTOMY WITH NEGATIVE MARGINS/   RADIATION ENDED 11/2012   History of chronic bronchitis    History of colonic polyps    History of tachycardia    CONTROLLED  WITH ATENOLOL   Hyperlipidemia    Hypertension    Neuromuscular disorder (HCC)    fibromyalgia   Neuropathy    Osteoporosis 01/2019   T score -2.2 stable/improved from prior study   Pelvic pain    Personal history of radiation therapy    S/P radiation therapy 11/12/12 - 12/05/12   right Breast   Sepsis ([place]) 2014   from UTI    Sinus headache     Sjogren's disease ([place])    Urgency of urination     Past Surgical History:  Procedure Laterality Date   73 HOUR [place] STUDY N/A 04/03/2016   Procedure: 24 HOUR [place] STUDY;  Surgeon: Manus Gunning, MD;  Location: [facility];  Service: Gastroenterology;  Laterality: N/A;   BREAST BIOPSY Right 08/23/2012   ADH   BREAST BIOPSY Right 10/11/2012   Ductal Carcinoma   BREAST EXCISIONAL BIOPSY Right 09/04/2013   benign   BREAST LUMPECTOMY Right 10-11-2012   W/ SLN BX   CARDIAC CATHETERIZATION  09-13-2007  DR Lia Foyer   WELL-PRESERVED LVF/  DIFFUSE SCATTERED CORONARY CALCIFACATION AND ATHEROSCLEROSIS WITHOUT OBSTRUCTION   CARDIAC CATHETERIZATION  08-04-2010  DR Angelena Form   NON-OBSTRUCTIVE CAD/  pLAD 40%/  oLAD 30%/  mLAD 30%/  pRCA 30%/  EF 60%   CARDIOVASCULAR STRESS TEST  06-18-2012  DR McALHANY   LOW RISK NUCLEAR STUDY/  SMALL FIXED AREA OF MODERATELY DECREASED UPTAKE IN ANTEROSEPTAL WALL WHICH MAY BE ARTIFACTUAL/  NO ISCHEMIA/  EF  68%   COLONOSCOPY  09-29-2010   CYSTOSCOPY     CYSTOSCOPY WITH HYDRODISTENSION AND BIOPSY N/A 03/06/2014   Procedure: CYSTOSCOPY/HYDRODISTENSION/ INSTILATION OF MARCAINE AND PYRIDIUM;  Surgeon: Ailene Rud, MD;  Location: [facility];  Service: Urology;  Laterality: N/A;   ESOPHAGEAL MANOMETRY N/A 04/03/2016   Procedure: ESOPHAGEAL MANOMETRY (EM);  Surgeon: Manus Gunning, MD;  Location: [facility];  Service: Gastroenterology;  Laterality: N/A;   EXTRACORPOREAL SHOCK WAVE LITHOTRIPSY Left 02/06/2019   Procedure: EXTRACORPOREAL SHOCK WAVE LITHOTRIPSY (ESWL);  Surgeon: Kathie Rhodes, MD;  Location: [facility];  Service: Urology;  Laterality: Left;   NASAL SINUS SURGERY  1985   ORIF RIGHT ANKLE  FX  2006   POLYPECTOMY     REMOVAL VOCAL CORD CYST  FEB 2014   RIGHT BREAST BX  08-23-2012   RIGHT HAND SURGERY  X3  LAST ONE 2009   INCLUDES  ORIF RIGHT 5TH FINGER AND REVISION TWICE   SKIN CANCER EXCISION     TONSILLECTOMY AND  ADENOIDECTOMY  AGE 68   TOTAL ABDOMINAL HYSTERECTOMY W/ BILATERAL SALPINGOOPHORECTOMY  1982   W/  APPENDECTOMY   TRANSTHORACIC ECHOCARDIOGRAM  06-24-2012   GRADE I DIASTOLIC DYSFUNCTION/  EF 55-60%/  MILD MR   UPPER GASTROINTESTINAL ENDOSCOPY      Family History  Problem Relation Age of Onset   Rectal cancer Mother    Colon cancer Mother    Pancreatic cancer Mother    Diabetes Mother    Breast cancer Mother 59   Breast cancer Maternal Aunt        breast   Birth defects Maternal Aunt    Irritable bowel syndrome Son    Heart disease Son        CAD, MV replacement   Allergic Disorder Daughter    Diabetes Daughter    Colon cancer Father    Colon polyps Father    Diabetes Father    Stroke Father    Heart disease Father        CHF   Hyperlipidemia Father    Hypertension Father    Arthritis Father    Breast cancer Paternal Aunt    Breast cancer Paternal Aunt    Arthritis Paternal Uncle    Allergic Disorder Daughter    Heart disease Cousin        CAD, had a blood clot after stenting   Esophageal cancer Neg Hx    Stomach cancer Neg Hx     Social History   Socioeconomic History   Marital status: Married    Spouse name: Jori Moll    Number of children: 3   Years of education: BA   Highest education level: Not on file  Occupational History   Occupation: Retired Programmer, multimedia: RETIRED  Tobacco Use   Smoking status: Former    Packs/day: 1.00    Years: 15.00    Total pack years: 15.00    Types: Cigarettes    Quit date: 05/27/2005    Years since quitting: 17.5   Smokeless tobacco: Never  Vaping Use   Vaping Use: Never used  Substance and Sexual Activity   Alcohol use: No    Alcohol/week: 0.0 standard drinks of alcohol   Drug use: No   Sexual activity: Not Currently    Partners: Male    Birth control/protection: Surgical    Comment: 1st intercourse 76 yo-Fewer than 5 partners, hysterectomy  Other Topics Concern   Not on file  Social History  Narrative   Lives with  husband.   Caffeine use: 1/2 cup per day   Exercise-- 2days a week [employer],  Water aerobics, walking       Retired Marine scientist   No dietary restrictions, tries to maintain a heart healthy diet   Social Determinants of Health   Financial Resource Strain: Low Risk  (02/28/2022)   Overall Financial Resource Strain (CARDIA)    Difficulty of Paying Living Expenses: Not hard at all  Food Insecurity: No Food Insecurity (11/02/2022)   Hunger Vital Sign    Worried About Running Out of Food in the Last Year: Never true    Ran Out of Food in the Last Year: Never true  Transportation Needs: No Transportation Needs (11/02/2022)   PRAPARE - Hydrologist (Medical): No    Lack of Transportation (Non-Medical): No  Physical Activity: Not on file  Stress: Stress Concern Present (11/14/2022)   [place]    Feeling of Stress : To some extent  Social Connections: Socially Integrated (02/28/2022)   Social Connection and Isolation Panel [NHANES]    Frequency of Communication with Friends and Family: More than three times a week    Frequency of Social Gatherings with Friends and Family: More than three times a week    Attends Religious Services: More than 4 times per year    Active Member of [employer] or Organizations: Yes    Attends Music therapist: More than 4 times per year    Marital Status: Married  Human resources officer Violence: Not At Risk (02/28/2022)   Humiliation, Afraid, Rape, and Kick questionnaire    Fear of Current or Ex-Partner: No    Emotionally Abused: No    Physically Abused: No    Sexually Abused: No    Outpatient Medications Prior to Visit  Medication Sig Dispense Refill   acetaminophen (TYLENOL) 500 MG tablet Take 1,000 mg by mouth every 6 (six) hours as needed for mild pain or headache.      albuterol (VENTOLIN HFA) 108 (90 Base) MCG/ACT inhaler Inhale 2 puffs into the lungs every 4 (four)  hours as needed. 1 each 0   ALPRAZolam (XANAX) 0.25 MG tablet Take 1 tablet by mouth 3 (three) times daily as needed.     amLODipine (NORVASC) 5 MG tablet Take 1 tablet (5 mg total) by mouth daily. 90 tablet 3   aspirin EC 81 MG tablet Take 1 tablet (81 mg total) by mouth daily. 90 tablet 3   atenolol (TENORMIN) 50 MG tablet Take 1 tablet (50 mg total) by mouth 2 (two) times daily. 180 tablet 1   Cyanocobalamin (VITAMIN B-12) 5000 MCG TBDP Take 1 tablet by mouth daily with breakfast.     denosumab (PROLIA) 60 MG/ML SOSY injection Inject 60 mg into the skin every 6 (six) months.     DULoxetine (CYMBALTA) 30 MG capsule Take 1 capsule (30 mg total) by mouth daily. 30 capsule 1   DULoxetine (CYMBALTA) 60 MG capsule Take 1 capsule (60 mg total) by mouth 2 (two) times daily. 180 capsule 1   Evolocumab (REPATHA SURECLICK) 694 MG/ML SOAJ Inject 140 mg into the skin every 14 (fourteen) days. 6 mL 1   fluticasone (FLONASE) 50 MCG/ACT nasal spray Use 2 spray(s) in each nostril once daily 16 g 5   fluticasone (FLOVENT HFA) 110 MCG/ACT inhaler Inhale 2 puffs into the lungs 2 (two) times daily. 1 each 5  furosemide (LASIX) 20 MG tablet Take 1 tablet (20 mg total) by mouth as needed for edema. 90 tablet 3   gabapentin (NEURONTIN) 300 MG capsule Take 1-2 capsules (300-600 mg total) by mouth 3 (three) times daily.     hydroxychloroquine (PLAQUENIL) 200 MG tablet 1 tablet with food or milk     Meth-Hyo-M Bl-Na Phos-Ph Sal (URIBEL) 118 MG CAPS Take by mouth.     Multiple Vitamins-Minerals (MULTIVITAMIN WITH MINERALS) tablet Take 1 tablet by mouth daily.     nitroGLYCERIN (NITROSTAT) 0.4 MG SL tablet Place 1 tablet (0.4 mg total) under the tongue every 5 (five) minutes as needed for chest pain. 25 tablet 1   nystatin cream (MYCOSTATIN) Apply 1 application topically 2 (two) times daily. 30 g 2   pantoprazole (PROTONIX) 40 MG tablet Take 1 tablet (40 mg total) by mouth daily. 90 tablet 1   pentosan polysulfate  (ELMIRON) 100 MG capsule Take 1 capsule (100 mg total) by mouth 3 (three) times daily. Reported on 01/05/2016 (Patient taking differently: Take 100 mg by mouth 3 (three) times daily with meals as needed. Reported on 01/05/2016) 90 capsule 11   polyethylene glycol (MIRALAX) 17 g packet Take 17 g by mouth daily. 14 each 0   sucralfate (CARAFATE) 1 GM/10ML suspension Take 10 mLs (1 g total) by mouth 4 (four) times daily -  with meals and at bedtime. 420 mL 1   traZODone (DESYREL) 100 MG tablet Take 1 tablet (100 mg total) by mouth at bedtime. 90 tablet 1   No facility-administered medications prior to visit.    Allergies  Allergen Reactions   Clindamycin Rash and Shortness Of Breath   Clindamycin Hcl Shortness Of Breath and Rash   Penicillins Anaphylaxis   Rosuvastatin Anaphylaxis   Baclofen Other (See Comments) and Rash   Lincomycin Other (See Comments)   Prednisone Rash    Other reaction(s): Rash, Hives - can tolerate if she takes with benadryl    Clindamycin Hcl     Other reaction(s): Shortness of Breath, Rash   Codeine Hives and Other (See Comments)    headache Other reaction(s): Headache, Nausea, headache   Erythromycin Hives   Erythromycin Base Other (See Comments)    other   Fluzone [Influenza Virus Vaccine] Other (See Comments)    Local reaction at the site   Haemophilus Influenzae Other (See Comments)    Local reaction at the site Local reaction at the site   Haemophilus Influenzae Vaccines Other (See Comments)    Local reaction at site   Latex Hives   Levofloxacin     Other reaction(s): sick   Other Other (See Comments)    Local reaction at site   Pentazocine Other (See Comments)   Pentazocine Lactate Other (See Comments)    HALLUCINATION   Pneumococcal Vaccine Hives and Swelling    Other reaction(s): Hives, Shortness of Breath   Pneumococcal Vaccine Polyvalent Hives, Swelling and Other (See Comments)    REACTION: redness, swelling, and hives at injection site    Tamoxifen Nausea And Vomiting and Other (See Comments)    HEADACHE Other reaction(s): Headache, Nausea    ROS     Objective:    Physical Exam Constitutional:      Appearance: Normal appearance.  HENT:     Head: Normocephalic and atraumatic.  Cardiovascular:     Rate and Rhythm: Normal rate.  Skin:    General: Skin is warm and dry.     Comments: Erythematous rash noted between  buttocks  Neurological:     Mental Status: She is alert.      BP 120/60   Pulse 79   Temp 97.7 F (36.5 C)   Resp 18   Ht 5' 4.5" (1.638 m)   Wt 136 lb 6.4 oz (61.9 kg)   SpO2 99%   BMI 23.05 kg/m  Wt Readings from Last 3 Encounters:  12/13/22 136 lb 6.4 oz (61.9 kg)  12/05/22 133 lb 12.8 oz (60.7 kg)  11/29/22 136 lb (61.7 kg)       Assessment & Plan:   Problem List Items Addressed This Visit       Unprioritized   Intertrigo - Primary    New. Trial of lotrisone cream. Pt  is advised to call if symptoms worsen or if not improved in 1 week.        I am having [name] maintain her acetaminophen, aspirin EC, pentosan polysulfate, denosumab, polyethylene glycol, fluticasone, Vitamin B-12, nystatin cream, albuterol, furosemide, hydroxychloroquine, nitroGLYCERIN, pantoprazole, Uribel, atenolol, DULoxetine, DULoxetine, sucralfate, fluticasone, gabapentin, multivitamin with minerals, ALPRAZolam, amLODipine, Repatha SureClick, and traZODone.  No orders of the defined types were placed in this encounter.

## 2022-12-13 NOTE — Progress Notes (Signed)
                Abrahm Mancia, PsyD 

## 2022-12-15 ENCOUNTER — Ambulatory Visit: Payer: Medicare Other | Admitting: Physical Therapy

## 2022-12-19 ENCOUNTER — Ambulatory Visit: Payer: Medicare Other

## 2022-12-19 DIAGNOSIS — M545 Low back pain, unspecified: Secondary | ICD-10-CM

## 2022-12-19 DIAGNOSIS — H8112 Benign paroxysmal vertigo, left ear: Secondary | ICD-10-CM | POA: Diagnosis not present

## 2022-12-19 DIAGNOSIS — R42 Dizziness and giddiness: Secondary | ICD-10-CM

## 2022-12-19 DIAGNOSIS — M6281 Muscle weakness (generalized): Secondary | ICD-10-CM

## 2022-12-19 DIAGNOSIS — M5459 Other low back pain: Secondary | ICD-10-CM | POA: Diagnosis not present

## 2022-12-19 DIAGNOSIS — G8929 Other chronic pain: Secondary | ICD-10-CM | POA: Diagnosis not present

## 2022-12-19 DIAGNOSIS — M542 Cervicalgia: Secondary | ICD-10-CM | POA: Diagnosis not present

## 2022-12-19 DIAGNOSIS — R252 Cramp and spasm: Secondary | ICD-10-CM

## 2022-12-19 DIAGNOSIS — R2681 Unsteadiness on feet: Secondary | ICD-10-CM | POA: Diagnosis not present

## 2022-12-19 NOTE — Therapy (Signed)
OUTPATIENT PHYSICAL THERAPY TREATMENT   Patient Name: [name] MRN: [id number] DOB:02/18/1946, 76 y.o., female Today's Date: 12/19/2022  END OF SESSION:  PT End of Session - 12/19/22 1431     Visit Number 6    Number of Visits 16    Date for PT Re-Evaluation 01/16/23    Authorization Type BCBS medicare    Progress Note Due on Visit 10    PT Start Time 1316    PT Stop Time 1400    PT Time Calculation (min) 44 min    Activity Tolerance Patient tolerated treatment well    Behavior During Therapy WFL for tasks assessed/performed               Past Medical History:  Diagnosis Date   Allergy    Anemia    Anxiety    past hx    Arthritis    Asthma    Atrial flutter ([place])    past history- not current   Breast cancer ([place])    CAD (coronary artery disease) CARDIOLOGIST--  DR Angelena Form   mild non-obstructive cad   Cancer ([place])    right   Cataract    bilaterally removed    Chronic constipation    Chronic kidney disease    stage 3 ckd, interstitial cystitis   Concussion    x 3   COPD (chronic obstructive pulmonary disease) (HCC)    Depression    past hx    Family history of malignant hyperthermia    father had this   Fibromyalgia    Frequency of urination    GERD (gastroesophageal reflux disease)    H/O hiatal hernia    History of basal cell carcinoma excision    X2   History of breast cancer ONCOLOGIST-- DR Jana Hakim---  NO RECURRANCE   DX 07/2012;  LOW GRADE DCIS  ER+PR+  ----  S/P RIGHT LUMPECTOMY WITH NEGATIVE MARGINS/   RADIATION ENDED 11/2012   History of chronic bronchitis    History of colonic polyps    History of tachycardia    CONTROLLED  WITH ATENOLOL   Hyperlipidemia    Hypertension    Neuromuscular disorder (HCC)    fibromyalgia   Neuropathy    Osteoporosis 01/2019   T score -2.2 stable/improved from prior study   Pelvic pain    Personal history of radiation therapy    S/P radiation therapy 11/12/12 - 12/05/12   right Breast   Sepsis  ([place]) 2014   from UTI    Sinus headache    Sjogren's disease ([place])    Urgency of urination    Past Surgical History:  Procedure Laterality Date   42 HOUR [place] STUDY N/A 04/03/2016   Procedure: 24 HOUR [place] STUDY;  Surgeon: Manus Gunning, MD;  Location: [facility];  Service: Gastroenterology;  Laterality: N/A;   BREAST BIOPSY Right 08/23/2012   ADH   BREAST BIOPSY Right 10/11/2012   Ductal Carcinoma   BREAST EXCISIONAL BIOPSY Right 09/04/2013   benign   BREAST LUMPECTOMY Right 10-11-2012   W/ SLN BX   CARDIAC CATHETERIZATION  09-13-2007  DR Lia Foyer   WELL-PRESERVED LVF/  DIFFUSE SCATTERED CORONARY CALCIFACATION AND ATHEROSCLEROSIS WITHOUT OBSTRUCTION   CARDIAC CATHETERIZATION  08-04-2010  DR MCALHANY   NON-OBSTRUCTIVE CAD/  pLAD 40%/  oLAD 30%/  mLAD 30%/  pRCA 30%/  EF 60%   CARDIOVASCULAR STRESS TEST  06-18-2012  DR McALHANY   LOW RISK NUCLEAR STUDY/  SMALL FIXED AREA OF MODERATELY  DECREASED UPTAKE IN ANTEROSEPTAL WALL WHICH MAY BE ARTIFACTUAL/  NO ISCHEMIA/  EF 68%   COLONOSCOPY  09-29-2010   CYSTOSCOPY     CYSTOSCOPY WITH HYDRODISTENSION AND BIOPSY N/A 03/06/2014   Procedure: CYSTOSCOPY/HYDRODISTENSION/ INSTILATION OF MARCAINE AND PYRIDIUM;  Surgeon: Ailene Rud, MD;  Location: [facility];  Service: Urology;  Laterality: N/A;   ESOPHAGEAL MANOMETRY N/A 04/03/2016   Procedure: ESOPHAGEAL MANOMETRY (EM);  Surgeon: Manus Gunning, MD;  Location: [facility];  Service: Gastroenterology;  Laterality: N/A;   EXTRACORPOREAL SHOCK WAVE LITHOTRIPSY Left 02/06/2019   Procedure: EXTRACORPOREAL SHOCK WAVE LITHOTRIPSY (ESWL);  Surgeon: Kathie Rhodes, MD;  Location: [facility];  Service: Urology;  Laterality: Left;   NASAL SINUS SURGERY  1985   ORIF RIGHT ANKLE  FX  2006   POLYPECTOMY     REMOVAL VOCAL CORD CYST  FEB 2014   RIGHT BREAST BX  08-23-2012   RIGHT HAND SURGERY  X3  LAST ONE 2009   INCLUDES  ORIF RIGHT 5TH FINGER AND REVISION TWICE   SKIN CANCER  EXCISION     TONSILLECTOMY AND ADENOIDECTOMY  AGE 17   TOTAL ABDOMINAL HYSTERECTOMY W/ BILATERAL SALPINGOOPHORECTOMY  1982   W/  APPENDECTOMY   TRANSTHORACIC ECHOCARDIOGRAM  06-24-2012   GRADE I DIASTOLIC DYSFUNCTION/  EF 55-60%/  MILD MR   UPPER GASTROINTESTINAL ENDOSCOPY     Patient Active Problem List   Diagnosis Date Noted   Intertrigo 12/13/2022   Hyperkalemia 09/28/2022   Anemia 09/28/2022   Tremor 07/05/2022   RUQ pain 07/05/2022   Leukocytosis 07/05/2022   Chronic renal insufficiency 03/21/2022   Sjogren's disease ([place]) 12/12/2021   Vocal cord cyst 05/18/2021   High serum vitamin D 05/18/2021   Dysuria 03/09/2021   Vitamin B12 deficiency 03/08/2021   Preventative health care 03/08/2021   Hyperglycemia 03/08/2021   Low back pain 09/08/2020   OSA and COPD overlap syndrome ([place]) 06/10/2020   Recurrent falls 05/02/2020   Statin intolerance 02/29/2020   RLS (restless legs syndrome) 11/09/2019   Kidney stone 02/26/2019   Recurrent sinusitis 02/25/2019   Hx of colonic polyp 02/25/2019   DDD (degenerative disc disease), cervical 09/17/2018   Degenerative scoliosis 04/22/2018   Primary insomnia 06/25/2017   Chronic interstitial cystitis 02/01/2017   Chronic cough 01/09/2017   Right arm pain 07/20/2016   Deviated septum 05/12/2016   Nasal turbinate hypertrophy 05/12/2016   Dysphagia    Allergic rhinitis 01/25/2016   Other and combined forms of senile cataract 07/15/2015   Dyspnea    Left hip pain 04/30/2015   Sciatica 04/30/2015   Knee pain, right 04/30/2015   Bilateral carpal tunnel syndrome 03/04/2015   Hyperlipidemia 03/01/2015   Pulmonary nodules 02/12/2015   External hemorrhoids 02/05/2015   Chronic night sweats 01/08/2015   Atypical chest pain 01/01/2015   Renal insufficiency 07/16/2014   Memory loss 05/22/2014   Peripheral neuropathy 05/22/2014   Abdominal pain 10/26/2013   Headache 10/13/2013   Arthralgia 08/18/2013   ANA positive 07/29/2013    Depression 02/05/2013   Family history of malignant hyperthermia 02/05/2013   Malignant neoplasm of lower-outer quadrant of right breast of female, estrogen receptor positive ([place]) 01/03/2013   Splenic lesion 09/02/2012   Asthma, allergic 08/05/2012   GERD (gastroesophageal reflux disease) 06/03/2012   Edema 04/08/2012   Stricture and stenosis of esophagus 10/19/2011   Functional constipation 07/21/2011   Nonspecific (abnormal) findings on radiological and other examination of biliary tract 07/21/2011   Osteoporosis 04/17/2011  Leg pain 02/24/2011   FIBROCYSTIC BREAST DISEASE, HX OF 10/18/2010   Essential hypertension 08/25/2010   CAD in native artery 08/25/2010   TOBACCO ABUSE, HX OF 08/25/2010   GENERALIZED ANXIETY DISORDER 08/03/2010   Fibromyalgia 01/11/2010    PCP: Mosie Lukes, MD  REFERRING PROVIDER:  1) Debbrah Alar NP 2)Whittemore, Stanton Kidney, MD; Ferdie Ping NP  REFERRING DIAG:  1) H81.13 (ICD-10-CM) - Benign paroxysmal positional vertigo due to bilateral vestibular disorder 2) M54.16 lumbar radiculopathy 3) M54.6 Pain in thoracic spine.   THERAPY DIAG:  Dizziness and giddiness  Chronic bilateral low back pain without sciatica  Unsteadiness on feet  Muscle weakness (generalized)  Cervicalgia  Cramp and spasm  ONSET DATE: chronic neck/back pain, dizziness x 2 months.   Rationale for Evaluation and Treatment: Rehabilitation  SUBJECTIVE:   SUBJECTIVE STATEMENT: Karn reports continued LBP and neck pain, felt good after DN but yesterday had a lot of pain. Pt accompanied by: self  PERTINENT HISTORY: GERD, acid reflux, chronic neck and back pain, history of breast cancer (10 years clear), neuropathy, osteoporosis, anemia, arthritis, CKD, COPD, fibromyalgia, Sjogren's disease  PAIN:  Are you having pain? Yes: NPRS scale: 5/10 Pain location: neck to both shoulders Pain description: mostly just ache, sometimes sharp, numbness, tingling Aggravating  factors: sleeping on 2+ pillows (needed for breathing Relieving factors: none  Are you having pain? Yes: NPRS scale: 5/10 Pain location: tailbone Pain description: ache Aggravating factors: prolonged sitting, walking, lifting, standing Relieving factors: none  PRECAUTIONS: Fall  WEIGHT BEARING RESTRICTIONS: No  FALLS: Has patient fallen in last 6 months? Yes. Number of falls 1 fell down 5 steps on tailbone, pelvic fracture  LIVING ENVIRONMENT: Lives with: lives with their spouse Lives in: House/apartment Stairs: Yes: Internal: 14+ 15 steps; on right going up, on left going up, and can reach both and External: 2 steps; on right going up Has following equipment at home: Single point cane and Walker - 2 wheeled  PLOF: Independent retired Marine scientist  PATIENT GOALS: relief of pain  OBJECTIVE:   DIAGNOSTIC FINDINGS: 09/02/20 CT Spine Cervical  There is mild multilevel intervertebral disc height loss with associated mild disc osteophyte formation throughout the cervical spine. There are scattered facet hypertrophy. No high-grade spinal canal or neural foraminal narrowing is identified.   COGNITION: Overall cognitive status:  reporting difficulty with short term memory, difficulty with finding words   SENSATION: Not tested  POSTURE:  rounded shoulders, forward head, and increased thoracic kyphosis  Cervical ROM:    Active Tested in sitting AROM (deg) eval  Flexion 30*  Extension 10*  Right lateral flexion 15*  Left lateral flexion 15*  Right rotation 55*  Left rotation 50*  (Blank rows = not tested) *increased pain  STRENGTH: 4+/5 bil UE, increased pain  LOWER EXTREMITY MMT:   MMT (Tested in sitting) Right eval Left eval  Hip flexion 4+ 4+  Hip abduction 4+ 4+  Hip adduction 4+ 4+  Knee flexion 5 5  Knee extension 4+ 4+  Ankle dorsiflexion 3 3  Ankle plantarflexion 3 3  Ankle inversion 3 3  Ankle eversion 3 3  (Blank rows = not tested)  LUMBAR ROM:   Active   AROM    Flexion To knees, pain radiates R anterior thigh  Extension 75% limited *  Right lateral flexion Mid thigh  Left lateral flexion Mid thigh   Right rotation SNL  Left rotation Sharp pain    (Blank rows = not tested)   PALPATION:  tenderness throughout lumbar spine, QL, R glut and piriformis.   GAIT: Gait pattern: step through pattern Distance walked: 50' Assistive device utilized: Single point cane Level of assistance: Modified independence Comments: decreased gait speed.  Staggers, loses balance, stops with head position changes.   FUNCTIONAL TESTS:  MCTSIB: Condition 1: Avg of 3 trials: 30 sec, Condition 2: Avg of 3 trials: 30 sec, Condition 3: Avg of 3 trials: 0 sec, Condition 4: Avg of 3 trials: 0 sec, and Total Score: 60/120 5x STS 11/23/22= 25 seconds - intermittant UE  support, rapid fatigue, dizziness DGI  11/23/22  6/24 high risk of falls.   PATIENT SURVEYS:  Modified Oswestry 39/50 = 68% severe disability   DHI 88% -severe disability    VESTIBULAR ASSESSMENT:  GENERAL OBSERVATION: Patient has black eye on right, reports bumping it against microwave door.  She was having a headache and frequently wincing and touching face, lights were turned off in exam room to decrease discomfort.  She was obviously in discomfort due to LBP as well, and needed to stand up several times to relieve pain.  She was also distressed when having difficulty finding words, and needed gentle redirection to stay on topic.      SYMPTOM BEHAVIOR:  Subjective history: Started 2 months ago.  She gets dizzy if looks up quickly, gets up too fast, turning (left or right), has almost fallen into doors because misjudges distance.  Rolling over in bed causes severe spinning and nausea.  Non-Vestibular symptoms: changes in vision, neck pain, headaches, and nausea/vomiting  Type of dizziness: Blurred Vision, Imbalance (Disequilibrium), Spinning/Vertigo, and Unsteady with head/body turns  Frequency:  daily    Duration: couple of minutes  Aggravating factors: Induced by position change: rolling to the right and sit to stand and Induced by motion: looking up at the ceiling, turning body quickly, turning head quickly, and driving  Relieving factors: head stationary, slow movements, and just stand still  Progression of symptoms: worse  OCULOMOTOR EXAM:  Ocular Alignment: normal  Ocular ROM:  unable to test vertical excursion today due to headache.   Spontaneous Nystagmus: absent  Gaze-Induced Nystagmus: age appropriate nystagmus at end range  Smooth Pursuits: saccades  Saccades: hypometric/undershoots  Convergence/Divergence:  cm   VESTIBULAR - OCULAR REFLEX:    Slow VOR:  saccades.   VOR Cancellation:   Head-Impulse Test: corrective saccades  Dynamic Visual Acuity:  NT   POSITIONAL TESTING: 11/23/22 - no nystagmus or dizziness with positional testing for horizontal and posterior canals.  12/01/22- Dix Hallpike Left - no nystagmus but did report dizziness and blurred vision with testing position.   MOTION SENSITIVITY:  Motion Sensitivity Quotient Intensity: 0 = none, 1 = Lightheaded, 2 = Mild, 3 = Moderate, 4 = Severe, 5 = Vomiting  Intensity  1. Sitting to supine 3  2. Supine to L side   3. Supine to R side 3  4. Supine to sitting   5. L Hallpike-Dix 0  6. Up from L  0  7. R Hallpike-Dix 0  8. Up from R  0  9. Sitting, head tipped to L knee 4  10. Head up from L knee 0  11. Sitting, head tipped to R knee 0  12. Head up from R knee 0  13. Sitting head turns x5 3 - saccades  14.Sitting head nods x5 1- but limited extension  15. In stance, 180 turn to L    16. In stance, 180 turn to R  OTHOSTATICS: 11/23/22  Lying [name] - 122/72 HR 74  Sitting - 110/68 HR 74  Standing 110/68 HR 74    TREATMENT:                                                                                                   DATE:   12/19/22 Therapeutic Exercise: to improve strength and  mobility.  Nustep L3x37mn Seated shoulder rolls x 10 both ways Chin tucks x 10 3 sec Standing rows RTB x 10 3 sec hold Standing shoulder ext RTB x 10 3 sec hold  Ultrasound: x 8 min to bil proximal UT 1 MHz, 1.2 w/cm2 cont to decrease inflammation/pain  12/12/2022  Manual Therapy: to decrease muscle spasm and pain and improve mobility STM/TPR to bil UT, levator scapulae,  suboccipitals and cervical paraspinals (R>L).   UPA mobs bil lumbar paraspinals, skilled palpation and monitoring during dry needling. Trigger Point Dry-Needling  Treatment instructions: Expect mild to moderate muscle soreness. S/S of pneumothorax if dry needled over a lung field, and to seek immediate medical attention should they occur. Patient verbalized understanding of these instructions and education. Patient Consent Given: Yes Education handout provided: Previously provided Muscles treated: bil lumbar multifidi L2-5 Electrical stimulation performed: No Parameters: N/A Treatment response/outcome: Twitch Response Elicited and Palpable Increase in Muscle Length Ultrasound: x 8 min to R proximal UT 1 MHz, 1.2 w/cm2 cont to decrease inflammation/pain   12/08/2022 Therapeutic Exercise: to improve strength and mobility.   Nustep L4 x 7 min  Manual Therapy: to decrease muscle spasm and pain and improve mobility STM/TPR to bil UT, levator scapulae, and cervical paraspinals.  IASTM to glutes with foam roller, UPA mobs L lumbar parspinals grade 1-2, skilled palpation and monitoring during dry needling. Trigger Point Dry-Needling  Treatment instructions: Expect mild to moderate muscle soreness. S/S of pneumothorax if dry needled over a lung field, and to seek immediate medical attention should they occur. Patient verbalized understanding of these instructions and education. Patient Consent Given: Yes Education handout provided: Previously provided Muscles treated: bil lumbar multifidi L2-5, R glut med, R piriformis.   Electrical stimulation performed: No Parameters: N/A Treatment response/outcome: Twitch Response Elicited and Palpable Increase in Muscle Length Neuromuscular Reeducation: to improve balance and dizziness.            VOR x 1 exercises - seated, horizontal 3 x 30 sec with rest breaks between, vertical 3 x 30 sec rest breaks between.  No dizziness but does report blurred vision.    12/01/2022 Canalith Repositioning: L dix hallpike followed by L epley maneuver x 1.  Reported dizziness throughout maneuver, unable to retest today due to neck pain and dizziness.  To avoid bending over today, and continue sleeping on extra pillows.  Manual Therapy: to decrease muscle spasm and pain and improve mobility STM/TPR to lumbar paraspinals, QL, glutes and glut med.  skilled palpation and monitoring during dry needling. Trigger Point Dry-Needling  Treatment instructions: Expect mild to moderate muscle soreness. Patient verbalized understanding of these instructions and education. Patient Consent Given: Yes Education handout provided: Previously provided Muscles treated:  bil lumbar multifidi L2-5, R glut med, R piriformis.  Treatment response/outcome: Twitch Response Elicited and Palpable Increase in Muscle Length  PATIENT EDUCATION: Education details: continue exercises  Person educated: Patient Education method: Explanation Education comprehension: verbalized understanding  HOME EXERCISE PROGRAM:  Access Code: [email or web address] URL: [email or web address] Date: 11/23/2022 Prepared by: Glenetta Hew  Exercises - Seated Gaze Stabilization with Head Rotation  - 3 x daily - 7 x weekly - 3 sets - 10 reps - Seated Gaze Stabilization with Head Nod  - 3 x daily - 7 x weekly - 3 sets - 10 reps   GOALS: Goals reviewed with patient? Yes  SHORT TERM GOALS: Target date: 12/13/2022   Patient will be independent with initial HEP.  Baseline: Too be given Goal status: MET 11/23/22- given VOR  exercises. Needs review. 12/12/22- reports good consistency.   2.  Patient will complete vestibular assessment including DGI  Baseline: not finished Goal status: MET  3.  Patient will complete assessment for neck and back pain.  Baseline: not finished.  Goal status: MET 12/01/22- back assessed.    LONG TERM GOALS: Target date: 01/17/2023   Patient will be independent with advanced/ongoing HEP to improve outcomes and carryover.  Baseline:  Goal status: IN PROGRESS  2.  Patient will report 50% improvement in neck pain to improve QOL.  Baseline:  Goal status: IN PROGRESS  3.  Patient will demonstrate full pain free cervical ROM for safety with driving.  Baseline: see objective  Goal status: IN PROGRESS  4.  Patient will report <50% disability on modified Oswestry to demonstrate improved functional ability.  Baseline: 68% disability  Goal status: IN PROGRESS  5.  Patient will report 18 points improvement on DHI to demonstrate improved dizziness and QOL.  Baseline: 88% disability - severe  Goal status: IN PROGRESS  6. Patient will score at least 19/24 on DGI to decrease risk of falls.     Baseline: TBA Goal status: IN PROGRESS   7. Patient will report 50% improvement in LBP to improve QOL.  Baseline: severe, constant Goal status: IN PROGRESS  8. Patient will be able to roll over in bed without dizziness.   Baseline: spinning, nausea Goal status: IN PROGRESS  ASSESSMENT:  CLINICAL IMPRESSION: Pt continues to have intermittent dizziness and consistent neck and back pain. Did [place] to decrease muscle tension, pain, and promote healing. Progressed postural strengthening exercises to promote normal posture and decrease strain on neck and back. No increased pain but fatigue noted in postural muscles with TB exercises.     OBJECTIVE IMPAIRMENTS: Abnormal gait, decreased balance, decreased endurance, decreased mobility, difficulty walking, decreased ROM, decreased strength, decreased  safety awareness, dizziness, increased fascial restrictions, impaired perceived functional ability, increased muscle spasms, impaired sensation, impaired vision/preception, postural dysfunction, and pain.   ACTIVITY LIMITATIONS: carrying, lifting, bending, sitting, standing, squatting, sleeping, stairs, bed mobility, reach over head, locomotion level, and caring for others  PARTICIPATION LIMITATIONS: meal prep, cleaning, laundry, driving, shopping, and community activity  PERSONAL FACTORS: Age, Time since onset of injury/illness/exacerbation, and 3+ comorbidities: GERD, acid reflux, chronic neck and back pain, history of breast cancer (10 years clear), neuropathy, osteoporosis, anemia, arthritis, CKD, COPD, fibromyalgia, Sjogren's disease  are also affecting patient's functional outcome.   REHAB POTENTIAL: Good  CLINICAL DECISION MAKING: Unstable/unpredictable  EVALUATION COMPLEXITY: High   PLAN:  PT FREQUENCY: 1-2x/week  PT DURATION: 8 weeks  PLANNED INTERVENTIONS: Therapeutic exercises, Therapeutic activity, Neuromuscular re-education, Balance training, Gait training, Patient/Family education, Self Care, Joint  mobilization, Stair training, Vestibular training, Canalith repositioning, Dry Needling, Electrical stimulation, Spinal mobilization, Cryotherapy, Moist heat, Traction, Ultrasound, Manual therapy, and Re-evaluation  PLAN FOR NEXT SESSION: Focus on neck and back pain as tolerated.   Add postural exercises - spending long periods at computer.   Readdress vestibular as neck pain improves to rule out cervicogenic causes.     Artist Pais, PTA 12/19/2022, 2:32 PM

## 2022-12-20 ENCOUNTER — Encounter: Payer: Self-pay | Admitting: Family Medicine

## 2022-12-20 ENCOUNTER — Ambulatory Visit (INDEPENDENT_AMBULATORY_CARE_PROVIDER_SITE_OTHER): Payer: Medicare Other | Admitting: Family Medicine

## 2022-12-20 VITALS — BP 101/60 | HR 73 | Temp 98.0°F | Resp 16 | Wt 133.0 lb

## 2022-12-20 DIAGNOSIS — R42 Dizziness and giddiness: Secondary | ICD-10-CM | POA: Diagnosis not present

## 2022-12-20 DIAGNOSIS — H6992 Unspecified Eustachian tube disorder, left ear: Secondary | ICD-10-CM

## 2022-12-20 NOTE — Patient Instructions (Signed)
Claritin (loratadine), Allegra (fexofenadine), Zyrtec (cetirizine) which is also equivalent to Xyzal (levocetirizine); these are listed in order from weakest to strongest. Generic, and therefore cheaper, options are in the parentheses.   Flonase (fluticasone); nasal spray that is over the counter. 2 sprays each nostril, once daily. Aim towards the same side eye when you spray.  There are available OTC, and the generic versions, which may be cheaper, are in parentheses. Show this to a pharmacist if you have trouble finding any of these items.  I do not think you need the Epley maneuver anymore.   Let us know if you need anything.

## 2022-12-20 NOTE — Progress Notes (Signed)
Chief Complaint  Patient presents with   Eye Injury    Here for follow up   Dizziness    Still having some dizziness    Subjective: Patient is a 76 y.o. female here for f/u.  Seen 3 weeks ago for vertigo and has been doing the Epley maneuver with little relief (saw vest rehab). They are working on rehabbing her shoulder/neck region now. Has associated fullness/clicking in her L ear. Takes INCS 2-3 times per week. Also takes Claritin. Eating/drinking normally.   Past Medical History:  Diagnosis Date   Allergy    Anemia    Anxiety    past hx    Arthritis    Asthma    Atrial flutter ([place])    past history- not current   Breast cancer ([place])    CAD (coronary artery disease) CARDIOLOGIST--  DR Angelena Form   mild non-obstructive cad   Cancer ([place])    right   Cataract    bilaterally removed    Chronic constipation    Chronic kidney disease    stage 3 ckd, interstitial cystitis   Concussion    x 3   COPD (chronic obstructive pulmonary disease) ([place])    Depression    past hx    Family history of malignant hyperthermia    father had this   Fibromyalgia    Frequency of urination    GERD (gastroesophageal reflux disease)    H/O hiatal hernia    History of basal cell carcinoma excision    X2   History of breast cancer ONCOLOGIST-- DR Jana Hakim---  NO RECURRANCE   DX 07/2012;  LOW GRADE DCIS  ER+PR+  ----  S/P RIGHT LUMPECTOMY WITH NEGATIVE MARGINS/   RADIATION ENDED 11/2012   History of chronic bronchitis    History of colonic polyps    History of tachycardia    CONTROLLED  WITH ATENOLOL   Hyperlipidemia    Hypertension    Neuromuscular disorder (HCC)    fibromyalgia   Neuropathy    Osteoporosis 01/2019   T score -2.2 stable/improved from prior study   Pelvic pain    Personal history of radiation therapy    S/P radiation therapy 11/12/12 - 12/05/12   right Breast   Sepsis ([place]) 2014   from UTI    Sinus headache    Sjogren's disease (HCC)    Urgency of urination      Objective: BP 101/60 (BP Location: Left Arm, Patient Position: Sitting, Cuff Size: Small)   Pulse 73   Temp 98 F (36.7 C) (Oral)   Resp 16   Wt 133 lb (60.3 kg)   SpO2 100%   BMI 22.48 kg/m  General: Awake, appears stated age Heart: RRR, no LE edema Lungs: CTAB, no rales, wheezes or rhonchi. No accessory muscle use Neuro: DHP neg b/l (different from last time) HEENT: Canals patent b/l, TM's neg b/l, nares patent w/o rhinorrhea, MMM Psych: Age appropriate judgment and insight, normal affect and mood  Assessment and Plan: Dysfunction of left eustachian tube  Dizziness  Change Claritin to Allegra or Zyrtec. Take INCS daily. If no improvement, would consider ENT.  She is not longer having s/s's of BPPV, no longer requires Epley maneuver. Focus on rehabbing back/neck regions. Ice, heat, Tylenol. Hopefully tx'ing #1 will resolve this issue.  F/u w reg PCP next mo as scheduled.  The patient voiced understanding and agreement to the plan.  I spent 30 min w pt discussing the above plans in addition to  reviewing her chart on the same day of the visit.   [place], DO 12/20/22  12:26 PM

## 2022-12-21 ENCOUNTER — Ambulatory Visit: Payer: Self-pay | Admitting: Licensed Clinical Social Worker

## 2022-12-21 NOTE — Patient Instructions (Signed)
Visit Information  Thank you for taking time to visit with me today. Please don't hesitate to contact me if I can be of assistance to you.   Following are the goals we discussed today:   Goals Addressed             This Visit's Progress    COMPLETED: Care Coordination Connect for therapy       Care Coordination Interventions: Active listening / Reflection utilized  Emotional Support Provided Participation in counseling encouraged : completed initial appointment with [facility] 12/13/22 Worked on Self-Care Goals  Will continue with Self care goals and engaging with family /friends Reviewed all upcoming appointments          Please call the care guide team at [phone] if you need to cancel or reschedule your appointment.   If you are experiencing a Mental Health or [place] or need someone to talk to, please call the Suicide and Crisis Lifeline: 988 call the [place] National Suicide Prevention Lifeline: 365-096-6484 or TTY: 212 314 6015 TTY 838-090-3385) to talk to a trained counselor call 1-800-273-TALK (toll free, 24 hour hotline)   Patient verbalizes understanding of instructions and care plan provided today and agrees to view in [place]. Active MyChart status and patient understanding of how to access instructions and care plan via MyChart confirmed with patient.     No further follow up required: By Social Work at this time  [name], [place] [phone]

## 2022-12-21 NOTE — Patient Outreach (Signed)
  Care Coordination  Follow Up Visit Note   12/21/2022 Name: [name] MRN: [id number] DOB: February 11, 1946  [name] is a 76 y.o. year old female who sees Sheri Lukes, MD for primary care. I spoke with  [name] by phone today.  What matters to the patients health and wellness today?    Patient is making progress with reducing symptoms of anxiety and depression.  She had her initial therapy appointment and reports things went well.    Recommendation: Patient may benefit from, and is in agreement to :  Keep next appointment with [name] Continue with Self-care goals and behavioral activation   Goals Addressed             This Visit's Progress    COMPLETED: Care Coordination Connect for therapy       Care Coordination Interventions: Active listening / Reflection utilized  Emotional Support Provided Participation in counseling encouraged : completed initial appointment with [name] 12/13/22 Worked on Self-Care Goals  Will continue with Self care goals and engaging with family /friends Reviewed all upcoming appointments          SDOH assessments and interventions completed:  No   Care Coordination Interventions:  Yes, provided   Follow up plan:  No further intervention required by [name] RN Care Coordinator will continue to with patient  Encounter Outcome:  Pt. Visit Completed   [name], [place] [phone]

## 2022-12-26 ENCOUNTER — Telehealth: Payer: Self-pay | Admitting: *Deleted

## 2022-12-26 NOTE — Progress Notes (Signed)
  Care Coordination Note  12/26/2022 Name: [name] MRN: [id number] DOB: 02/08/1946  [name] is a 77 y.o. year old female who is a primary care patient of Mosie Lukes, MD and is actively engaged with the care management team. I reached out to [name] by phone today to assist with re-scheduling a follow up visit with the RN Case Manager  Follow up plan: Unsuccessful telephone outreach attempt made. A HIPAA compliant phone message was left for the patient providing contact information and requesting a return call.   [place]  Direct Dial: [phone]

## 2022-12-27 ENCOUNTER — Ambulatory Visit: Payer: Medicare Other | Attending: Family Medicine | Admitting: Physical Therapy

## 2022-12-27 ENCOUNTER — Encounter: Payer: Self-pay | Admitting: Physical Therapy

## 2022-12-27 DIAGNOSIS — M542 Cervicalgia: Secondary | ICD-10-CM

## 2022-12-27 DIAGNOSIS — M6281 Muscle weakness (generalized): Secondary | ICD-10-CM | POA: Diagnosis not present

## 2022-12-27 DIAGNOSIS — R252 Cramp and spasm: Secondary | ICD-10-CM

## 2022-12-27 DIAGNOSIS — G8929 Other chronic pain: Secondary | ICD-10-CM

## 2022-12-27 DIAGNOSIS — M545 Low back pain, unspecified: Secondary | ICD-10-CM | POA: Diagnosis not present

## 2022-12-27 DIAGNOSIS — R2681 Unsteadiness on feet: Secondary | ICD-10-CM

## 2022-12-27 DIAGNOSIS — R42 Dizziness and giddiness: Secondary | ICD-10-CM
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
## 2022-12-27 NOTE — Progress Notes (Signed)
  Care Coordination Note  12/27/2022 Name: [name] MRN: [id number] DOB: [birth year]  [name] is a 77 y.o. year old female who is a primary care patient of Mosie Lukes, MD and is actively engaged with the care management team. I reached out to [name] by phone today to assist with re-scheduling a follow up visit with the RN Case Manager  Follow up plan: Telephone appointment with care management team member scheduled for:12/29/22 at [place]: [phone]

## 2022-12-29 ENCOUNTER — Telehealth: Payer: Self-pay

## 2022-12-29 NOTE — Patient Outreach (Addendum)
  Care Coordination   12/29/2022 Name: [name] MRN: [id number] DOB: [birth year]   Care Coordination Outreach Attempts:  An unsuccessful telephone outreach was attempted for a scheduled appointment today.  Follow Up Plan:  Additional outreach attempts will be made to offer the patient care coordination information and services.   Encounter Outcome:  No Answer   Care Coordination Interventions:  No, not indicated    Thea Silversmith, RN, MSN, BSN, CCM [facility] Care Coordinator [phone]   Addendum: [facility] received notification from care guide that patient called and left message requesting to reschedule telephone call. Care guide to reschedule call next week.

## 2023-01-01 ENCOUNTER — Telehealth: Payer: Self-pay | Admitting: *Deleted

## 2023-01-01 DIAGNOSIS — L578 Other skin changes due to chronic exposure to nonionizing radiation: Secondary | ICD-10-CM | POA: Diagnosis not present

## 2023-01-01 DIAGNOSIS — L821 Other seborrheic keratosis: Secondary | ICD-10-CM | POA: Diagnosis not present

## 2023-01-01 DIAGNOSIS — L570 Actinic keratosis: Secondary | ICD-10-CM | POA: Diagnosis not present

## 2023-01-01 DIAGNOSIS — L814 Other melanin hyperpigmentation: Secondary | ICD-10-CM | POA: Diagnosis not present

## 2023-01-01 DIAGNOSIS — D225 Melanocytic nevi of trunk: Secondary | ICD-10-CM | POA: Diagnosis not present

## 2023-01-01 NOTE — Progress Notes (Signed)
  Care Coordination Note  01/01/2023 Name: [name] MRN: [id number] DOB: [birth year]  [name] is a 77 y.o. year old female who is a primary care patient of Mosie Lukes, MD and is actively engaged with the care management team. I reached out to [name] by phone today to assist with re-scheduling a follow up visit with the RN Case Manager  Follow up plan: Unsuccessful telephone outreach attempt made. A HIPAA compliant phone message was left for the patient providing contact information and requesting a return call.   Julian Hy, [place] Direct Dial: [phone]

## 2023-01-02 ENCOUNTER — Encounter: Payer: Self-pay | Admitting: Physical Therapy

## 2023-01-02 ENCOUNTER — Ambulatory Visit: Payer: Medicare Other | Admitting: Physical Therapy

## 2023-01-02 DIAGNOSIS — G8929 Other chronic pain: Secondary | ICD-10-CM | POA: Diagnosis not present

## 2023-01-02 DIAGNOSIS — M545 Low back pain, unspecified: Secondary | ICD-10-CM

## 2023-01-02 DIAGNOSIS — R252 Cramp and spasm: Secondary | ICD-10-CM

## 2023-01-02 DIAGNOSIS — M6281 Muscle weakness (generalized): Secondary | ICD-10-CM

## 2023-01-02 DIAGNOSIS — M542 Cervicalgia: Secondary | ICD-10-CM

## 2023-01-02 DIAGNOSIS — R2681 Unsteadiness on feet: Secondary | ICD-10-CM | POA: Diagnosis not present

## 2023-01-02 DIAGNOSIS — R42 Dizziness and giddiness: Secondary | ICD-10-CM
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
## 2023-01-04 DIAGNOSIS — M546 Pain in thoracic spine: Secondary | ICD-10-CM | POA: Diagnosis not present

## 2023-01-05 ENCOUNTER — Ambulatory Visit: Payer: Medicare Other | Admitting: Physical Therapy

## 2023-01-09 ENCOUNTER — Ambulatory Visit: Payer: Medicare Other | Admitting: Physical Therapy

## 2023-01-09 ENCOUNTER — Encounter: Payer: Self-pay | Admitting: Physical Therapy

## 2023-01-09 DIAGNOSIS — R2681 Unsteadiness on feet: Secondary | ICD-10-CM

## 2023-01-09 DIAGNOSIS — M6281 Muscle weakness (generalized): Secondary | ICD-10-CM

## 2023-01-09 DIAGNOSIS — R42 Dizziness and giddiness: Secondary | ICD-10-CM | POA: Diagnosis not present

## 2023-01-09 DIAGNOSIS — G8929 Other chronic pain: Secondary | ICD-10-CM

## 2023-01-09 DIAGNOSIS — M542 Cervicalgia: Secondary | ICD-10-CM

## 2023-01-09 DIAGNOSIS — R252 Cramp and spasm: Secondary | ICD-10-CM | POA: Diagnosis not present

## 2023-01-09 DIAGNOSIS — M545 Low back pain, unspecified: Secondary | ICD-10-CM | POA: Diagnosis not present

## 2023-01-09 NOTE — Therapy (Signed)
OUTPATIENT PHYSICAL THERAPY TREATMENT   Patient Name: [name] MRN: [id number] DOB:1946-11-15, 77 y.o., female Today's Date: 01/09/2023  END OF SESSION:  PT End of Session - 01/09/23 1102     Visit Number 9    Number of Visits 16    Date for PT Re-Evaluation 01/16/23    Authorization Type BCBS medicare    Progress Note Due on Visit 10    PT Start Time 1102    Activity Tolerance Patient tolerated treatment well    Behavior During Therapy WFL for tasks assessed/performed               Past Medical History:  Diagnosis Date   Allergy    Anemia    Anxiety    past hx    Arthritis    Asthma    Atrial flutter ([place])    past history- not current   Breast cancer ([place])    CAD (coronary artery disease) CARDIOLOGIST--  DR Angelena Form   mild non-obstructive cad   Cancer ([place])    right   Cataract    bilaterally removed    Chronic constipation    Chronic kidney disease    stage 3 ckd, interstitial cystitis   Concussion    x 3   COPD (chronic obstructive pulmonary disease) (HCC)    Depression    past hx    Family history of malignant hyperthermia    father had this   Fibromyalgia    Frequency of urination    GERD (gastroesophageal reflux disease)    H/O hiatal hernia    History of basal cell carcinoma excision    X2   History of breast cancer ONCOLOGIST-- DR Jana Hakim---  NO RECURRANCE   DX 07/2012;  LOW GRADE DCIS  ER+PR+  ----  S/P RIGHT LUMPECTOMY WITH NEGATIVE MARGINS/   RADIATION ENDED 11/2012   History of chronic bronchitis    History of colonic polyps    History of tachycardia    CONTROLLED  WITH ATENOLOL   Hyperlipidemia    Hypertension    Neuromuscular disorder (HCC)    fibromyalgia   Neuropathy    Osteoporosis 01/2019   T score -2.2 stable/improved from prior study   Pelvic pain    Personal history of radiation therapy    S/P radiation therapy 11/12/12 - 12/05/12   right Breast   Sepsis ([place]) 2014   from UTI    Sinus headache    Sjogren's  disease ([place])    Urgency of urination    Past Surgical History:  Procedure Laterality Date   32 HOUR [place] STUDY N/A 04/03/2016   Procedure: 24 HOUR PH STUDY;  Surgeon: Manus Gunning, MD;  Location: [facility];  Service: Gastroenterology;  Laterality: N/A;   BREAST BIOPSY Right 08/23/2012   ADH   BREAST BIOPSY Right 10/11/2012   Ductal Carcinoma   BREAST EXCISIONAL BIOPSY Right 09/04/2013   benign   BREAST LUMPECTOMY Right 10-11-2012   W/ SLN BX   CARDIAC CATHETERIZATION  09-13-2007  DR Lia Foyer   WELL-PRESERVED LVF/  DIFFUSE SCATTERED CORONARY CALCIFACATION AND ATHEROSCLEROSIS WITHOUT OBSTRUCTION   CARDIAC CATHETERIZATION  08-04-2010  DR MCALHANY   NON-OBSTRUCTIVE CAD/  pLAD 40%/  oLAD 30%/  mLAD 30%/  pRCA 30%/  EF 60%   CARDIOVASCULAR STRESS TEST  06-18-2012  DR McALHANY   LOW RISK NUCLEAR STUDY/  SMALL FIXED AREA OF MODERATELY DECREASED UPTAKE IN ANTEROSEPTAL WALL WHICH MAY BE ARTIFACTUAL/  NO ISCHEMIA/  EF 68%  COLONOSCOPY  09-29-2010   CYSTOSCOPY     CYSTOSCOPY WITH HYDRODISTENSION AND BIOPSY N/A 03/06/2014   Procedure: CYSTOSCOPY/HYDRODISTENSION/ INSTILATION OF MARCAINE AND PYRIDIUM;  Surgeon: Ailene Rud, MD;  Location: [facility];  Service: Urology;  Laterality: N/A;   ESOPHAGEAL MANOMETRY N/A 04/03/2016   Procedure: ESOPHAGEAL MANOMETRY (EM);  Surgeon: Manus Gunning, MD;  Location: [facility];  Service: Gastroenterology;  Laterality: N/A;   EXTRACORPOREAL SHOCK WAVE LITHOTRIPSY Left 02/06/2019   Procedure: EXTRACORPOREAL SHOCK WAVE LITHOTRIPSY (ESWL);  Surgeon: Kathie Rhodes, MD;  Location: [facility];  Service: Urology;  Laterality: Left;   NASAL SINUS SURGERY  1985   ORIF RIGHT ANKLE  FX  2006   POLYPECTOMY     REMOVAL VOCAL CORD CYST  FEB 2014   RIGHT BREAST BX  08-23-2012   RIGHT HAND SURGERY  X3  LAST ONE 2009   INCLUDES  ORIF RIGHT 5TH FINGER AND REVISION TWICE   SKIN CANCER EXCISION     TONSILLECTOMY AND ADENOIDECTOMY  AGE 61    TOTAL ABDOMINAL HYSTERECTOMY W/ BILATERAL SALPINGOOPHORECTOMY  1982   W/  APPENDECTOMY   TRANSTHORACIC ECHOCARDIOGRAM  06-24-2012   GRADE I DIASTOLIC DYSFUNCTION/  EF 55-60%/  MILD MR   UPPER GASTROINTESTINAL ENDOSCOPY     Patient Active Problem List   Diagnosis Date Noted   Intertrigo 12/13/2022   Hyperkalemia 09/28/2022   Anemia 09/28/2022   Tremor 07/05/2022   RUQ pain 07/05/2022   Leukocytosis 07/05/2022   Chronic renal insufficiency 03/21/2022   Sjogren's disease ([place]) 12/12/2021   Vocal cord cyst 05/18/2021   High serum vitamin D 05/18/2021   Dysuria 03/09/2021   Vitamin B12 deficiency 03/08/2021   Preventative health care 03/08/2021   Hyperglycemia 03/08/2021   Low back pain 09/08/2020   OSA and COPD overlap syndrome ([place]) 06/10/2020   Recurrent falls 05/02/2020   Statin intolerance 02/29/2020   RLS (restless legs syndrome) 11/09/2019   Kidney stone 02/26/2019   Recurrent sinusitis 02/25/2019   Hx of colonic polyp 02/25/2019   DDD (degenerative disc disease), cervical 09/17/2018   Degenerative scoliosis 04/22/2018   Primary insomnia 06/25/2017   Chronic interstitial cystitis 02/01/2017   Chronic cough 01/09/2017   Right arm pain 07/20/2016   Deviated septum 05/12/2016   Nasal turbinate hypertrophy 05/12/2016   Dysphagia    Allergic rhinitis 01/25/2016   Other and combined forms of senile cataract 07/15/2015   Dyspnea    Left hip pain 04/30/2015   Sciatica 04/30/2015   Knee pain, right 04/30/2015   Bilateral carpal tunnel syndrome 03/04/2015   Hyperlipidemia 03/01/2015   Pulmonary nodules 02/12/2015   External hemorrhoids 02/05/2015   Chronic night sweats 01/08/2015   Atypical chest pain 01/01/2015   Renal insufficiency 07/16/2014   Memory loss 05/22/2014   Peripheral neuropathy 05/22/2014   Abdominal pain 10/26/2013   Headache 10/13/2013   Arthralgia 08/18/2013   ANA positive 07/29/2013   Depression 02/05/2013   Family history of malignant  hyperthermia 02/05/2013   Malignant neoplasm of lower-outer quadrant of right breast of female, estrogen receptor positive ([place]) 01/03/2013   Splenic lesion 09/02/2012   Asthma, allergic 08/05/2012   GERD (gastroesophageal reflux disease) 06/03/2012   Edema 04/08/2012   Stricture and stenosis of esophagus 10/19/2011   Functional constipation 07/21/2011   Nonspecific (abnormal) findings on radiological and other examination of biliary tract 07/21/2011   Osteoporosis 04/17/2011   Leg pain 02/24/2011   FIBROCYSTIC BREAST DISEASE, HX OF 10/18/2010   Essential hypertension 08/25/2010  CAD in native artery 08/25/2010   TOBACCO ABUSE, HX OF 08/25/2010   GENERALIZED ANXIETY DISORDER 08/03/2010   Fibromyalgia 01/11/2010    PCP: Mosie Lukes, MD  REFERRING PROVIDER:  1) Debbrah Alar NP 2)Whittemore, Stanton Kidney, MD; Ferdie Ping NP  REFERRING DIAG:  1) H81.13 (ICD-10-CM) - Benign paroxysmal positional vertigo due to bilateral vestibular disorder 2) M54.16 lumbar radiculopathy 3) M54.6 Pain in thoracic spine.   THERAPY DIAG:  Dizziness and giddiness  Chronic bilateral low back pain without sciatica  Muscle weakness (generalized)  Unsteadiness on feet  Cervicalgia  Cramp and spasm  ONSET DATE: chronic neck/back pain, dizziness x 2 months.   Rationale for Evaluation and Treatment: Rehabilitation  SUBJECTIVE:   SUBJECTIVE STATEMENT: Patient feels like everything doesn't feel right today, feels foggy, dizziness has come back, was in and out of bed yesterday, everything hurts due to rain.  Friday she didn't come in because had running nose.       Pt accompanied by: self  PERTINENT HISTORY: GERD, acid reflux, chronic neck and back pain, history of breast cancer (10 years clear), neuropathy, osteoporosis, anemia, arthritis, CKD, COPD, fibromyalgia, Sjogren's disease  PAIN:  Are you having pain? Yes: NPRS scale: 8/10 Pain location: neck to both shoulders Pain description:  mostly just ache, sometimes sharp, numbness, tingling Aggravating factors: sleeping on 2+ pillows (needed for breathing Relieving factors: none  Are you having pain? Yes: NPRS scale: 10/10 Pain location: back Pain description: ache Aggravating factors: prolonged sitting, walking, lifting, standing Relieving factors: none  PRECAUTIONS: Fall  WEIGHT BEARING RESTRICTIONS: No  FALLS: Has patient fallen in last 6 months? Yes. Number of falls 1 fell down 5 steps on tailbone, pelvic fracture  LIVING ENVIRONMENT: Lives with: lives with their spouse Lives in: House/apartment Stairs: Yes: Internal: 14+ 15 steps; on right going up, on left going up, and can reach both and External: 2 steps; on right going up Has following equipment at home: Single point cane and Walker - 2 wheeled  PLOF: Independent retired Marine scientist  PATIENT GOALS: relief of pain  OBJECTIVE:   DIAGNOSTIC FINDINGS: 09/02/20 CT Spine Cervical  There is mild multilevel intervertebral disc height loss with associated mild disc osteophyte formation throughout the cervical spine. There are scattered facet hypertrophy. No high-grade spinal canal or neural foraminal narrowing is identified.   COGNITION: Overall cognitive status:  reporting difficulty with short term memory, difficulty with finding words   SENSATION: Not tested  POSTURE:  rounded shoulders, forward head, and increased thoracic kyphosis  Cervical ROM:    Active Tested in sitting AROM (deg) eval  Flexion 30*  Extension 10*  Right lateral flexion 15*  Left lateral flexion 15*  Right rotation 55*  Left rotation 50*  (Blank rows = not tested) *increased pain  STRENGTH: 4+/5 bil UE, increased pain  LOWER EXTREMITY MMT:   MMT (Tested in sitting) Right eval Left eval  Hip flexion 4+ 4+  Hip abduction 4+ 4+  Hip adduction 4+ 4+  Knee flexion 5 5  Knee extension 4+ 4+  Ankle dorsiflexion 3 3  Ankle plantarflexion 3 3  Ankle inversion 3 3  Ankle  eversion 3 3  (Blank rows = not tested)  LUMBAR ROM:   Active  AROM    Flexion To knees, pain radiates R anterior thigh  Extension 75% limited *  Right lateral flexion Mid thigh  Left lateral flexion Mid thigh   Right rotation SNL  Left rotation Sharp pain    (Blank rows =  not tested)   PALPATION:  tenderness throughout lumbar spine, QL, R glut and piriformis.   GAIT: Gait pattern: step through pattern Distance walked: 50' Assistive device utilized: Single point cane Level of assistance: Modified independence Comments: decreased gait speed.  Staggers, loses balance, stops with head position changes.   FUNCTIONAL TESTS:  MCTSIB: Condition 1: Avg of 3 trials: 30 sec, Condition 2: Avg of 3 trials: 30 sec, Condition 3: Avg of 3 trials: 0 sec, Condition 4: Avg of 3 trials: 0 sec, and Total Score: 60/120 5x STS 11/23/22= 25 seconds - intermittant UE  support, rapid fatigue, dizziness DGI  11/23/22  6/24 high risk of falls.   PATIENT SURVEYS:  Modified Oswestry 39/50 = 68% severe disability   DHI 88% -severe disability    VESTIBULAR ASSESSMENT:  GENERAL OBSERVATION: Patient has black eye on right, reports bumping it against microwave door.  She was having a headache and frequently wincing and touching face, lights were turned off in exam room to decrease discomfort.  She was obviously in discomfort due to LBP as well, and needed to stand up several times to relieve pain.  She was also distressed when having difficulty finding words, and needed gentle redirection to stay on topic.      SYMPTOM BEHAVIOR:  Subjective history: Started 2 months ago.  She gets dizzy if looks up quickly, gets up too fast, turning (left or right), has almost fallen into doors because misjudges distance.  Rolling over in bed causes severe spinning and nausea.  Non-Vestibular symptoms: changes in vision, neck pain, headaches, and nausea/vomiting  Type of dizziness: Blurred Vision, Imbalance (Disequilibrium),  Spinning/Vertigo, and Unsteady with head/body turns  Frequency: daily    Duration: couple of minutes  Aggravating factors: Induced by position change: rolling to the right and sit to stand and Induced by motion: looking up at the ceiling, turning body quickly, turning head quickly, and driving  Relieving factors: head stationary, slow movements, and just stand still  Progression of symptoms: worse  OCULOMOTOR EXAM:  Ocular Alignment: normal  Ocular ROM:  unable to test vertical excursion today due to headache.   Spontaneous Nystagmus: absent  Gaze-Induced Nystagmus: age appropriate nystagmus at end range  Smooth Pursuits: saccades  Saccades: hypometric/undershoots  Convergence/Divergence:  cm   VESTIBULAR - OCULAR REFLEX:    Slow VOR:  saccades.   VOR Cancellation:   Head-Impulse Test: corrective saccades  Dynamic Visual Acuity:  NT   POSITIONAL TESTING: 11/23/22 - no nystagmus or dizziness with positional testing for horizontal and posterior canals.  12/01/22- Dix Hallpike Left - no nystagmus but did report dizziness and blurred vision with testing position.   MOTION SENSITIVITY:  Motion Sensitivity Quotient Intensity: 0 = none, 1 = Lightheaded, 2 = Mild, 3 = Moderate, 4 = Severe, 5 = Vomiting  Intensity  1. Sitting to supine 3  2. Supine to L side   3. Supine to R side 3  4. Supine to sitting   5. L Hallpike-Dix 0  6. Up from L  0  7. R Hallpike-Dix 0  8. Up from R  0  9. Sitting, head tipped to L knee 4  10. Head up from L knee 0  11. Sitting, head tipped to R knee 0  12. Head up from R knee 0  13. Sitting head turns x5 3 - saccades  14.Sitting head nods x5 1- but limited extension  15. In stance, 180 turn to L    16. In stance,  180 turn to R     OTHOSTATICS: 11/23/22  Lying downs - 122/72 HR 74  Sitting - 110/68 HR 74  Standing 110/68 HR 74    TREATMENT:                                                                                                   DATE:    01/09/2023 Therapeutic Exercise: to improve strength and mobility.  Demo, verbal and tactile cues throughout for technique. Nustep L5 x 6 min  Manual Therapy: to decrease muscle spasm and pain and improve mobility STM/TPR to R levator scapulae Trigger Point Dry-Needling  Treatment instructions: Expect mild to moderate muscle soreness. S/S of pneumothorax if dry needled over a lung field, and to seek immediate medical attention should they occur. Patient verbalized understanding of these instructions and education. Patient Consent Given: Yes Education handout provided: Previously provided Muscles treated: R Upper Trapezius, Levator Scapulae, R cervical multifidi, bil lumbar multifidi L3-5, bil glut med, bil piriformis Electrical stimulation performed: No Parameters: N/A Treatment response/outcome: Twitch Response Elicited and Palpable Increase in Muscle Length  01/02/2023 Therapeutic Exercise: to improve strength and mobility.  Demo, verbal and tactile cues throughout for technique. Review of HEP - chin tucks, shoulder rolls - added levator stretch 2 x 15 sec hold, scap squeezes 10 x 5 sec hold, hold doorway stretch for now.  Manual Therapy: to decrease muscle spasm and pain and improve mobility STM/TPR to R levator scapulae, UT, lumbar paraspinals, UPA mobs lumbar spine grade 2-3, skilled palpation and monitoring during dry needling. Trigger Point Dry-Needling  Treatment instructions: Expect mild to moderate muscle soreness. S/S of pneumothorax if dry needled over a lung field, and to seek immediate medical attention should they occur. Patient verbalized understanding of these instructions and education. Patient Consent Given: Yes Education handout provided: Previously provided Muscles treated: bil glut med, piriformis, lumbar multifidi L3-5 Electrical stimulation performed: No Parameters: N/A Treatment response/outcome: Twitch Response Elicited and Palpable Increase in Muscle  Length  12/27/22 THEREX Seated shoulder rolls x10 Seated cervical rotations x10 Seated shoulder ER 10x3 sec Chin tuck x10 Doorway pec stretch low, mid, high x30 sec each  MANUAL THERAPY STM & TPR for UTs, cervical paraspinals Skilled assessment and palpation for TPDN Trigger Point Dry-Needling  Treatment instructions: Expect mild to moderate muscle soreness. S/S of pneumothorax if dry needled over a lung field, and to seek immediate medical attention should they occur. Patient verbalized understanding of these instructions and education.  Patient Consent Given: Yes Education handout provided: Previously provided Muscles treated: Bilat UT, cervical paraspinals, suboccipitals, pec major Electrical stimulation performed: No Parameters: N/A Treatment response/outcome: Twitch response ilicited, palpable increase in muscle length, decrease in tight feeling    PATIENT EDUCATION: Education details: continue HEP, heat/cold as needed.  Person educated: Patient Education method: Explanation Education comprehension: verbalized understanding  HOME EXERCISE PROGRAM:  Access Code: [email or web address] URL: [email or web address] Date: 12/27/2022 Prepared by: Estill Bamberg April Thurnell Garbe  Exercises - Seated Gaze Stabilization with Head Rotation  - 3 x daily - 7 x weekly - 3 sets - 10 reps -  Seated Gaze Stabilization with Head Nod  - 3 x daily - 7 x weekly - 3 sets - 10 reps - Seated Cervical Retraction  - 1 x daily - 7 x weekly - 2 sets - 10 reps - Standing Shoulder Row with Anchored Resistance  - 1 x daily - 7 x weekly - 2 sets - 10 reps - Doorway Pec Stretch at 60 Elevation  - 1 x daily - 7 x weekly - 2 sets - 30 sec hold - Doorway Pec Stretch at 90 Degrees Abduction  - 1 x daily - 7 x weekly - 2 sets - 30 sec hold - Doorway Pec Stretch at 120 Degrees Abduction  - 1 x daily - 7 x weekly - 2 sets - 30 sec hold - Corner Pec Minor Stretch  - 1 x daily - 7 x weekly - 2 sets - 30 sec hold -  Shoulder External Rotation and Scapular Retraction  - 1 x daily - 7 x weekly - 2 sets - 10 reps   GOALS: Goals reviewed with patient? Yes  SHORT TERM GOALS: Target date: 12/13/2022   Patient will be independent with initial HEP.  Baseline: Too be given Goal status: MET 11/23/22- given VOR exercises. Needs review. 12/12/22- reports good consistency.   2.  Patient will complete vestibular assessment including DGI  Baseline: not finished Goal status: MET  3.  Patient will complete assessment for neck and back pain.  Baseline: not finished.  Goal status: MET 12/01/22- back assessed.    LONG TERM GOALS: Target date: 01/17/2023   Patient will be independent with advanced/ongoing HEP to improve outcomes and carryover.  Baseline:  Goal status: IN PROGRESS  2.  Patient will report 50% improvement in neck pain to improve QOL.  Baseline:  Goal status: IN PROGRESS  3.  Patient will demonstrate full pain free cervical ROM for safety with driving.  Baseline: see objective  Goal status: IN PROGRESS  4.  Patient will report <50% disability on modified Oswestry to demonstrate improved functional ability.  Baseline: 68% disability  Goal status: IN PROGRESS  5.  Patient will report 18 points improvement on DHI to demonstrate improved dizziness and QOL.  Baseline: 88% disability - severe  Goal status: IN PROGRESS  6. Patient will score at least 19/24 on DGI to decrease risk of falls.     Baseline: TBA Goal status: IN PROGRESS   7. Patient will report 50% improvement in LBP to improve QOL.  Baseline: severe, constant Goal status: IN PROGRESS  8. Patient will be able to roll over in bed without dizziness.   Baseline: spinning, nausea Goal status: IN PROGRESS  ASSESSMENT:  CLINICAL IMPRESSION: [name] reports pain in her neck and dizziness in the recent days. Pt feels that the bad weather has been impacting her overall pain and discomfort. Pt still continues to be motivated to  continue POC and get better with the exercises she is doing at home. Pt benefited immensely with interventions including TrDN session with immediate relief with neck pain, decreased dizziness, decreased fogginess, and also improved cervical ROM.   [name] continues to demonstrate potential for improvement and would benefit from continued skilled therapy to address impairments.    OBJECTIVE IMPAIRMENTS: Abnormal gait, decreased balance, decreased endurance, decreased mobility, difficulty walking, decreased ROM, decreased strength, decreased safety awareness, dizziness, increased fascial restrictions, impaired perceived functional ability, increased muscle spasms, impaired sensation, impaired vision/preception, postural dysfunction, and pain.   ACTIVITY LIMITATIONS: carrying,  lifting, bending, sitting, standing, squatting, sleeping, stairs, bed mobility, reach over head, locomotion level, and caring for others  PARTICIPATION LIMITATIONS: meal prep, cleaning, laundry, driving, shopping, and community activity  PERSONAL FACTORS: Age, Time since onset of injury/illness/exacerbation, and 3+ comorbidities: GERD, acid reflux, chronic neck and back pain, history of breast cancer (10 years clear), neuropathy, osteoporosis, anemia, arthritis, CKD, COPD, fibromyalgia, Sjogren's disease  are also affecting patient's functional outcome.   REHAB POTENTIAL: Good  CLINICAL DECISION MAKING: Unstable/unpredictable  EVALUATION COMPLEXITY: High   PLAN:  PT FREQUENCY: 1-2x/week  PT DURATION: 8 weeks  PLANNED INTERVENTIONS: Therapeutic exercises, Therapeutic activity, Neuromuscular re-education, Balance training, Gait training, Patient/Family education, Self Care, Joint mobilization, Stair training, Vestibular training, Canalith repositioning, Dry Needling, Electrical stimulation, Spinal mobilization, Cryotherapy, Moist heat, Traction, Ultrasound, Manual therapy, and Re-evaluation  PLAN FOR NEXT SESSION:  Focus on neck and back pain as tolerated. Readdress vestibular as neck pain improves to rule out cervicogenic causes. Continue to check in and see how she felt after DN.      Rennie Natter, PT, DPT  01/09/2023, 11:46 AM

## 2023-01-10 ENCOUNTER — Other Ambulatory Visit: Payer: Self-pay | Admitting: Family Medicine

## 2023-01-12 ENCOUNTER — Encounter: Payer: Self-pay | Admitting: Physical Therapy

## 2023-01-12 ENCOUNTER — Ambulatory Visit: Payer: Medicare Other | Admitting: Physical Therapy

## 2023-01-12 DIAGNOSIS — M545 Low back pain, unspecified: Secondary | ICD-10-CM

## 2023-01-12 DIAGNOSIS — M542 Cervicalgia: Secondary | ICD-10-CM | POA: Diagnosis not present

## 2023-01-12 DIAGNOSIS — G8929 Other chronic pain: Secondary | ICD-10-CM | POA: Diagnosis not present

## 2023-01-12 DIAGNOSIS — R2681 Unsteadiness on feet: Secondary | ICD-10-CM | POA: Diagnosis not present

## 2023-01-12 DIAGNOSIS — R252 Cramp and spasm: Secondary | ICD-10-CM

## 2023-01-12 DIAGNOSIS — M6281 Muscle weakness (generalized): Secondary | ICD-10-CM

## 2023-01-12 DIAGNOSIS — R42 Dizziness and giddiness: Secondary | ICD-10-CM
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
## 2023-01-15 ENCOUNTER — Ambulatory Visit (INDEPENDENT_AMBULATORY_CARE_PROVIDER_SITE_OTHER): Payer: Medicare Other | Admitting: Psychologist

## 2023-01-15 DIAGNOSIS — F330 Major depressive disorder, recurrent, mild: Secondary | ICD-10-CM

## 2023-01-15 DIAGNOSIS — F411 Generalized anxiety disorder: Secondary | ICD-10-CM

## 2023-01-15 NOTE — Progress Notes (Signed)
[facility] Counselor/Therapist Progress Note  Patient ID: [name], MRN: [id number],    Date: 01/15/2023  Time Spent: 09:05 am to 09:45 am; total time: 40 minutes   This session was held via in person. The patient consented to in-person therapy and was in the clinician's office. Limits of confidentiality were discussed with the patient.    Treatment Type: Individual Therapy  Reported Symptoms: Sadness, guilt, and anxiety  Mental Status Exam: Appearance:  Well Groomed     Behavior: Appropriate  Motor: Normal  Speech/Language:  Clear and Coherent  Affect: Appropriate  Mood: normal  Thought process: normal  Thought content:   WNL  Sensory/Perceptual disturbances:   WNL  Orientation: oriented to person, place, and time/date  Attention: Good  Concentration: Good  Memory: WNL  Fund of knowledge:  Good  Insight:   Fair  Judgment:  Good  Impulse Control: Good   Risk Assessment: Danger to Self:  No Self-injurious Behavior: No Danger to Others: No Duty to Warn:no Physical Aggression / Violence:No  Access to Firearms a concern: No  Gang Involvement:No   Subjective: Beginning the session, patient described herself as okay. After reviewing the treatment plan, patient reflected on the holidays. From there, she spent time exploring the etiology of part of the challenges that she experiences. Specifically, she reflected on her relationship with her parents as well as her two daughters Adonis Brook and Lollie Marrow) and granddaughter Maylon Peppers). She identified with the theme of guilt and processed this idea. She processed thoughts and emotions. She was agreeable to homework and following up. She denied suicidal and homicidal ideation.    Interventions:  Worked on developing a therapeutic relationship with the patient using active listening and reflective statements. Provided emotional support using empathy and validation. Reviewed the treatment plan with the patient. Reviewed  events since the intake. Identified goals for the session. Processed thoughts and emotions. Explored the etiology of some of the symptoms endorsed. Processed the relationship patient maintains with her daughters. Challenged some of the thoughts expressed by the patient. Processed thoughts and emotions. Validated some of the patient that the patient experienced. Began to sit with the feeling of "guilt" and explore this feeling. Validated the patient's experience. Processed the therapy session. Assigned homework. Assessed for suicidal and homicidal ideation.   Homework: Write letter to the emotion of guilt   Next Session: Review homework and coping skills. Emotional support  Diagnosis: F33.0 major depressive affective disorder, recurrent, mild and F41.1 generalized anxiety disorder  Plan:   Goals Alleviate depressive symptoms Recognize, accept, and cope with depressive feelings Develop healthy thinking patterns Develop healthy interpersonal relationships Reduce overall frequency, intensity, and duration of anxiety Stabilize anxiety level wile increasing ability to function Enhance ability to effectively cope with full variety of stressors Learn and implement coping skills that result in a reduction of anxiety   Objectives target date for all objectives is 12/14/2023 Verbalize an understanding of the cognitive, physiological, and behavioral components of anxiety Learning and implement calming skills to reduce overall anxiety Verbalize an understanding of the role that cognitive biases play in excessive irrational worry and persistent anxiety symptoms Identify, challenge, and replace based fearful talk Learn and implement problem solving strategies Identify and engage in pleasant activities Learning and implement personal and interpersonal skills to reduce anxiety and improve interpersonal relationships Learn to accept limitations in life and commit to tolerating, rather than avoiding,  unpleasant emotions while accomplishing meaningful goals Identify major life conflicts from the past and present that  form the basis for present anxiety Maintain involvement in work, family, and social activities Reestablish a consistent sleep-wake cycle Cooperate with a medical evaluation  Cooperate with a medication evaluation by a physician Verbalize an accurate understanding of depression Verbalize an understanding of the treatment Identify and replace thoughts that support depression Learn and implement behavioral strategies Verbalize an understanding and resolution of current interpersonal problems Learn and implement problem solving and decision making skills Learn and implement conflict resolution skills to resolve interpersonal problems Verbalize an understanding of healthy and unhealthy emotions verbalize insight into how past relationships may be influence current experiences with depression Use mindfulness and acceptance strategies and increase value based behavior  Increase hopeful statements about the future.  Interventions Engage the patient in behavioral activation Use instruction, modeling, and role-playing to build the client's general social, communication, and/or conflict resolution skills Use Acceptance and Commitment Therapy to help client accept uncomfortable realities in order to accomplish value-consistent goals Reinforce the client's insight into the role of his/her past emotional pain and present anxiety  Support the client in following through with work, family, and social activities Teach and implement sleep hygiene practices  Refer the patient to a physician for a psychotropic medication consultation Monito the clint's psychotropic medication compliance Discuss how anxiety typically involves excessive worry, various bodily expressions of tension, and avoidance of what is threatening that interact to maintain the problem  Teach the patient relaxation  skills Assign the patient homework Discuss examples demonstrating that unrealistic worry overestimates the probability of threats and underestimates patient's ability  Assist the patient in analyzing his or her worries Help patient understand that avoidance is reinforcing  Consistent with treatment model, discuss how change in cognitive, behavioral, and interpersonal can help client alleviate depression CBT Behavioral activation help the client explore the relationship, nature of the dispute,  Help the client develop new interpersonal skills and relationships Conduct Problem solving therapy Teach conflict resolution skills Use a process-experiential approach Conduct TLDP Conduct ACT Evaluate need for psychotropic medication Monitor adherence to medication  The patient and clinician reviewed the treatment plan on 01/15/2023. The patient approved of the treatment plan.   Conception Chancy, PsyD

## 2023-01-15 NOTE — Progress Notes (Signed)
                Ovetta Bazzano, PsyD 

## 2023-01-16 ENCOUNTER — Encounter: Payer: Self-pay | Admitting: Physical Therapy

## 2023-01-16 ENCOUNTER — Ambulatory Visit: Payer: Medicare Other | Admitting: Physical Therapy

## 2023-01-16 DIAGNOSIS — M545 Low back pain, unspecified: Secondary | ICD-10-CM

## 2023-01-16 DIAGNOSIS — M6281 Muscle weakness (generalized): Secondary | ICD-10-CM | POA: Diagnosis not present

## 2023-01-16 DIAGNOSIS — G8929 Other chronic pain: Secondary | ICD-10-CM | POA: Diagnosis not present

## 2023-01-16 DIAGNOSIS — R2681 Unsteadiness on feet: Secondary | ICD-10-CM

## 2023-01-16 DIAGNOSIS — R42 Dizziness and giddiness: Secondary | ICD-10-CM

## 2023-01-16 DIAGNOSIS — R252 Cramp and spasm: Secondary | ICD-10-CM

## 2023-01-16 DIAGNOSIS — M542 Cervicalgia: Secondary | ICD-10-CM | POA: Diagnosis not present
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
## 2023-01-17 DIAGNOSIS — M40204 Unspecified kyphosis, thoracic region: Secondary | ICD-10-CM | POA: Diagnosis not present

## 2023-01-17 DIAGNOSIS — M546 Pain in thoracic spine: Secondary | ICD-10-CM | POA: Diagnosis not present

## 2023-01-18 DIAGNOSIS — M546 Pain in thoracic spine: Secondary | ICD-10-CM | POA: Diagnosis not present

## 2023-01-19 ENCOUNTER — Ambulatory Visit: Payer: Medicare Other | Admitting: Physical Therapy

## 2023-01-19 ENCOUNTER — Other Ambulatory Visit (HOSPITAL_COMMUNITY): Payer: Self-pay

## 2023-01-19 ENCOUNTER — Telehealth: Payer: Self-pay

## 2023-01-19 NOTE — Telephone Encounter (Signed)
Pharmacy Patient Advocate Encounter   Prior Authorization for Repatha '140mg'$ /ml has been approved.     key# [id number] Effective dates: 01/19/23 through 01/20/2024

## 2023-01-21 NOTE — Assessment & Plan Note (Signed)
Encourage heart healthy diet such as MIND or DASH diet, increase exercise, avoid trans fats, simple carbohydrates and processed foods, consider a krill or fish or flaxseed oil cap daily. Statin intolerant

## 2023-01-21 NOTE — Assessment & Plan Note (Signed)
Tolerating Repatha °

## 2023-01-21 NOTE — Assessment & Plan Note (Signed)
Monitor and report any concerns, no changes to meds. Encouraged heart healthy diet such as the DASH diet and exercise as tolerated.  ?

## 2023-01-21 NOTE — Assessment & Plan Note (Signed)
Supplement and monitor 

## 2023-01-21 NOTE — Assessment & Plan Note (Signed)
[id number] acceptable, minimize simple carbs. Increase exercise as tolerated.  

## 2023-01-21 NOTE — Assessment & Plan Note (Signed)
Hydrate and monitor 

## 2023-01-22 ENCOUNTER — Telehealth: Payer: Self-pay | Admitting: Family Medicine

## 2023-01-22 ENCOUNTER — Other Ambulatory Visit: Payer: Self-pay

## 2023-01-22 ENCOUNTER — Ambulatory Visit (HOSPITAL_BASED_OUTPATIENT_CLINIC_OR_DEPARTMENT_OTHER)
Admission: RE | Admit: 2023-01-22 | Discharge: 2023-01-22 | Disposition: A | Discharge status: Home / Self Care | Payer: Medicare Other | Source: Ambulatory Visit | Attending: Family Medicine | Admitting: Family Medicine

## 2023-01-22 ENCOUNTER — Other Ambulatory Visit: Payer: Self-pay | Admitting: Family Medicine

## 2023-01-22 ENCOUNTER — Ambulatory Visit (INDEPENDENT_AMBULATORY_CARE_PROVIDER_SITE_OTHER): Payer: Medicare Other | Admitting: Family Medicine

## 2023-01-22 VITALS — BP 110/64 | HR 118 | Temp 97.5°F | Resp 16 | Ht 64.0 in | Wt 133.0 lb

## 2023-01-22 DIAGNOSIS — M545 Low back pain, unspecified: Secondary | ICD-10-CM

## 2023-01-22 DIAGNOSIS — Z789 Other specified health status: Secondary | ICD-10-CM

## 2023-01-22 DIAGNOSIS — N189 Chronic kidney disease, unspecified: Secondary | ICD-10-CM | POA: Diagnosis not present

## 2023-01-22 DIAGNOSIS — R739 Hyperglycemia, unspecified: Secondary | ICD-10-CM

## 2023-01-22 DIAGNOSIS — S0993XA Unspecified injury of face, initial encounter: Secondary | ICD-10-CM

## 2023-01-22 DIAGNOSIS — R42 Dizziness and giddiness: Secondary | ICD-10-CM

## 2023-01-22 DIAGNOSIS — S0993XD Unspecified injury of face, subsequent encounter: Secondary | ICD-10-CM

## 2023-01-22 DIAGNOSIS — E538 Deficiency of other specified B group vitamins: Secondary | ICD-10-CM

## 2023-01-22 DIAGNOSIS — M810 Age-related osteoporosis without current pathological fracture: Secondary | ICD-10-CM | POA: Diagnosis not present

## 2023-01-22 DIAGNOSIS — E785 Hyperlipidemia, unspecified: Secondary | ICD-10-CM | POA: Diagnosis not present

## 2023-01-22 DIAGNOSIS — I10 Essential (primary) hypertension: Secondary | ICD-10-CM

## 2023-01-22 DIAGNOSIS — R748 Abnormal levels of other serum enzymes: Secondary | ICD-10-CM

## 2023-01-22 HISTORY — DX: Dizziness and giddiness: R42

## 2023-01-22 HISTORY — DX: Unspecified injury of face, initial encounter: S09.93XA

## 2023-01-22 LAB — CBC WITH DIFFERENTIAL/PLATELET
Basophils Absolute: 0.1 10*3/uL (ref 0.0–0.1)
Basophils Relative: 1.1 % (ref 0.0–3.0)
Eosinophils Absolute: 0.2 10*3/uL (ref 0.0–0.7)
Eosinophils Relative: 2 % (ref 0.0–5.0)
HCT: 36 % (ref 36.0–46.0)
Hemoglobin: 11.6 g/dL — ABNORMAL LOW (ref 12.0–15.0)
Lymphocytes Relative: 19.3 % (ref 12.0–46.0)
Lymphs Abs: 1.6 10*3/uL (ref 0.7–4.0)
MCHC: 32.1 g/dL (ref 30.0–36.0)
MCV: 86.6 fl (ref 78.0–100.0)
Monocytes Absolute: 0.7 10*3/uL (ref 0.1–1.0)
Monocytes Relative: 8.2 % (ref 3.0–12.0)
Neutro Abs: 5.9 10*3/uL (ref 1.4–7.7)
Neutrophils Relative %: 69.4 % (ref 43.0–77.0)
Platelets: 343 10*3/uL (ref 150.0–400.0)
RBC: 4.16 Mil/uL (ref 3.87–5.11)
RDW: 17.5 % — ABNORMAL HIGH (ref 11.5–15.5)
WBC: 8.5 10*3/uL (ref 4.0–10.5)

## 2023-01-22 LAB — COMPREHENSIVE METABOLIC PANEL
ALT: 7 U/L (ref 0–35)
AST: 14 U/L (ref 0–37)
Albumin: 3.8 g/dL (ref 3.5–5.2)
Alkaline Phosphatase: 108 U/L (ref 39–117)
BUN: 11 mg/dL (ref 6–23)
CO2: 28 mEq/L (ref 19–32)
Calcium: 8.7 mg/dL (ref 8.4–10.5)
Chloride: 102 mEq/L (ref 96–112)
Creatinine, Ser: 1 mg/dL (ref 0.40–1.20)
GFR: 54.32 mL/min — ABNORMAL LOW (ref >60.00)
Glucose, Bld: 92 mg/dL (ref 70–99)
Potassium: 4.3 mEq/L (ref 3.5–5.1)
Sodium: 138 mEq/L (ref 135–145)
Total Bilirubin: 0.4 mg/dL (ref 0.2–1.2)
Total Protein: 6.2 g/dL (ref 6.0–8.3)

## 2023-01-22 LAB — LIPID PANEL
Cholesterol: 124 mg/dL (ref 0–200)
HDL: 45 mg/dL (ref >39.00)
LDL Cholesterol: 48 mg/dL (ref 0–99)
NonHDL: 78.84
Total CHOL/HDL Ratio: 3
Triglycerides: 152 mg/dL — ABNORMAL HIGH (ref 0.0–149.0)
VLDL: 30.4 mg/dL (ref 0.0–40.0)

## 2023-01-22 LAB — VITAMIN D 25 HYDROXY (VIT D DEFICIENCY, FRACTURES): VITD: 21.83 ng/mL — ABNORMAL LOW (ref 30.00–100.00)

## 2023-01-22 LAB — VITAMIN B12: Vitamin B-12: 453 pg/mL (ref 211–911)

## 2023-01-22 LAB — TSH: TSH: 2.03 u[IU]/mL (ref 0.35–5.50)

## 2023-01-22 MED ORDER — HYOSCYAMINE SULFATE SL 0.125 MG SL SUBL: 0.1250 mg | SUBLINGUAL_TABLET | Freq: Three times a day (TID) | SUBLINGUAL | 2 refills | Status: DC | PRN

## 2023-01-22 MED ORDER — DOXYCYCLINE HYCLATE 100 MG PO TABS: 100.0000 mg | ORAL_TABLET | Freq: Two times a day (BID) | ORAL | 0 refills | Status: DC

## 2023-01-22 NOTE — Assessment & Plan Note (Signed)
Right cheek ran into microwave door 2 months ago and pain persists and increased congestion and yellow drainage persists. Check xray of face if no improvement after doxy, mucinex and probiotic

## 2023-01-22 NOTE — Telephone Encounter (Signed)
Pt called stating that the paperwork that was filled out for her handicap placard today (1.29.24) was for a temporary one instead of permanent. Pt would like to have this filled out and to call her when ready for pickup.

## 2023-01-22 NOTE — Assessment & Plan Note (Addendum)
She is working with PT with vestibular rehab and that has helped. It happens upon arising and the room starts spinning. Notes almost a year of intermittent symptoms. Will refer to neurology for further evaluation.

## 2023-01-22 NOTE — Progress Notes (Signed)
Subjective:    Patient ID: [name], female    DOB: June 08, 1946, 77 y.o.   MRN: [id number]  Chief Complaint  Patient presents with  . Follow-up    Follow up    HPI Patient is in today for follow up on chronic medical concerns. No recent febrile illness or acute hospitalizations. She is working with PT on her neck and back and that is doing well. Her family is doing well. Denies palp/SOB/HA/congestion/fevers/GI or GU c/o. Taking meds as prescribed. She is being referred back to Dr Nelva Bush for pain management on low back due to bone on bone and pain. She is trying to avoid surgery as it may not help enough any way. Dry needling has helped some. She notes some occassional chest pain but has been evaluated by cardiology and the consensus is it is GI related. It happens after she eats especially fasat.  Past Medical History:  Diagnosis Date  . Allergy   . Anemia   . Anxiety    past hx   . Arthritis   . Asthma   . Atrial flutter ([place])    past history- not current  . Breast cancer ([place])   . CAD (coronary artery disease) CARDIOLOGIST--  DR Angelena Form   mild non-obstructive cad  . Cancer ([place])    right  . Cataract    bilaterally removed   . Chronic constipation   . Chronic kidney disease    stage 3 ckd, interstitial cystitis  . Concussion    x 3  . COPD (chronic obstructive pulmonary disease) ([place])   . Depression    past hx   . Family history of malignant hyperthermia    father had this  . Fibromyalgia   . Frequency of urination   . GERD (gastroesophageal reflux disease)   . H/O hiatal hernia   . History of basal cell carcinoma excision    X2  . History of breast cancer ONCOLOGIST-- DR Jana Hakim---  NO RECURRANCE   DX 07/2012;  LOW GRADE DCIS  ER+PR+  ----  S/P RIGHT LUMPECTOMY WITH NEGATIVE MARGINS/   RADIATION ENDED 11/2012  . History of chronic bronchitis   . History of colonic polyps   . History of tachycardia    CONTROLLED  WITH ATENOLOL  . Hyperlipidemia   .  Hypertension   . Neuromuscular disorder (H[facility])    fibromyalgia  . Neuropathy   . Osteoporosis 01/2019   T score -2.2 stable/improved from prior study  . Pelvic pain   . Personal history of radiation therapy   . S/P radiation therapy 11/12/12 - 12/05/12   right Breast  . Sepsis ([place]) 2014   from UTI   . Sinus headache   . Sjogren's disease ([place])   . Urgency of urination     Past Surgical History:  Procedure Laterality Date  . [place] STUDY N/A 04/03/2016   Procedure: [place] STUDY;  Surgeon: Manus Gunning, MD;  Location: [facility];  Service: Gastroenterology;  Laterality: N/A;  . BREAST BIOPSY Right 08/23/2012   ADH  . BREAST BIOPSY Right 10/11/2012   Ductal Carcinoma  . BREAST EXCISIONAL BIOPSY Right 09/04/2013   benign  . BREAST LUMPECTOMY Right 10-11-2012   W/ SLN BX  . CARDIAC CATHETERIZATION  09-13-2007  DR Lia Foyer   WELL-PRESERVED LVF/  DIFFUSE SCATTERED CORONARY CALCIFACATION AND ATHEROSCLEROSIS WITHOUT OBSTRUCTION  . CARDIAC CATHETERIZATION  08-04-2010  DR Angelena Form   NON-OBSTRUCTIVE CAD/  pLAD 40%/  oLAD  30%/  mLAD 30%/  pRCA 30%/  EF 60%  . CARDIOVASCULAR STRESS TEST  06-18-2012  DR McALHANY   LOW RISK NUCLEAR STUDY/  SMALL FIXED AREA OF MODERATELY DECREASED UPTAKE IN ANTEROSEPTAL WALL WHICH MAY BE ARTIFACTUAL/  NO ISCHEMIA/  EF 68%  . COLONOSCOPY  09-29-2010  . CYSTOSCOPY    . CYSTOSCOPY WITH HYDRODISTENSION AND BIOPSY N/A 03/06/2014   Procedure: CYSTOSCOPY/HYDRODISTENSION/ INSTILATION OF MARCAINE AND PYRIDIUM;  Surgeon: Ailene Rud, MD;  Location[facility];  Service: Urology;  Laterality: N/A;  . ESOPHAGEAL MANOMETRY N/A 04/03/2016   Procedure: ESOPHAGEAL MANOMETRY (EM);  Surgeon: Manus Gunning, MD;  Location: [facility];  Service: Gastroenterology;  Laterality: N/A;  . EXTRACORPOREAL SHOCK WAVE LITHOTRIPSY Left 02/06/2019   Procedure: EXTRACORPOREAL SHOCK WAVE LITHOTRIPSY (ESWL);  Surgeon: Kathie Rhodes, MD;   Location: [facility];  Service: Urology;  Laterality: Left;  . NASAL SINUS SURGERY  1985  . ORIF RIGHT ANKLE  FX  2006  . POLYPECTOMY    . REMOVAL VOCAL CORD CYST  FEB 2014  . RIGHT BREAST BX  08-23-2012  . RIGHT HAND SURGERY  X3  LAST ONE 2009   INCLUDES  ORIF RIGHT 5TH FINGER AND REVISION TWICE  . SKIN CANCER EXCISION    . TONSILLECTOMY AND ADENOIDECTOMY  AGE 6  . TOTAL ABDOMINAL HYSTERECTOMY W/ BILATERAL SALPINGOOPHORECTOMY  1982   W/  APPENDECTOMY  . TRANSTHORACIC ECHOCARDIOGRAM  06-24-2012   GRADE I DIASTOLIC DYSFUNCTION/  EF 55-60%/  MILD MR  . UPPER GASTROINTESTINAL ENDOSCOPY      Family History  Problem Relation Age of Onset  . Rectal cancer Mother   . Colon cancer Mother   . Pancreatic cancer Mother   . Diabetes Mother   . Breast cancer Mother 71  . Breast cancer Maternal Aunt        breast  . Birth defects Maternal Aunt   . Irritable bowel syndrome Son   . Heart disease Son        CAD, MV replacement  . Allergic Disorder Daughter   . Diabetes Daughter   . Colon cancer Father   . Colon polyps Father   . Diabetes Father   . Stroke Father   . Heart disease Father        CHF  . Hyperlipidemia Father   . Hypertension Father   . Arthritis Father   . Breast cancer Paternal Aunt   . Breast cancer Paternal Aunt   . Arthritis Paternal Uncle   . Allergic Disorder Daughter   . Heart disease Cousin        CAD, had a blood clot after stenting  . Esophageal cancer Neg Hx   . Stomach cancer Neg Hx     Social History   Socioeconomic History  . Marital status: Married    Spouse name: Jori Moll   . Number of children: 3  . Years of education: BA  . Highest education level: Not on file  Occupational History  . Occupation: Retired Programmer, multimedia: RETIRED  Tobacco Use  . Smoking status: Former    Packs/day: 1.00    Years: 15.00    Total pack years: 15.00    Types: Cigarettes    Quit date: 05/27/2005    Years since quitting: 17.6  . Smokeless tobacco: Never  Vaping  Use  . Vaping Use: Never used  Substance and Sexual Activity  . Alcohol use: No    Alcohol/week: 0.0 standard drinks of  alcohol  . Drug use: No  . Sexual activity: Not Currently    Partners: Male    Birth control/protection: Surgical    Comment: 1st intercourse 77 yo-Fewer than 5 partners, hysterectomy  Other Topics Concern  . Not on file  Social History Narrative   Lives with husband.   Caffeine use: 1/2 cup per day   Exercise-- 2days a week [employer],  Water aerobics, walking       Retired Marine scientist   No dietary restrictions, tries to maintain a heart healthy diet   Social Determinants of Health   Financial Resource Strain: Low Risk  (02/28/2022)   Overall Financial Resource Strain (CARDIA)   . Difficulty of Paying Living Expenses: Not hard at all  Food Insecurity: No Food Insecurity (11/02/2022)   Hunger Vital Sign   . Worried About Charity fundraiser in the Last Year: Never true   . Ran Out of Food in the Last Year: Never true  Transportation Needs: No Transportation Needs (11/02/2022)   PRAPARE - Transportation   . Lack of Transportation (Medical): No   . Lack of Transportation (Non-Medical): No  Physical Activity: Not on file  Stress: Stress Concern Present (11/14/2022)   [place]   . Feeling of Stress : To some extent  Social Connections: Socially Integrated (02/28/2022)   Social Connection and Isolation Panel [NHANES]   . Frequency of Communication with Friends and Family: More than three times a week   . Frequency of Social Gatherings with Friends and Family: More than three times a week   . Attends Religious Services: More than 4 times per year   . Active Member of Clubs or Organizations: Yes   . Attends Archivist Meetings: More than 4 times per year   . Marital Status: Married  Human resources officer Violence: Not At Risk (02/28/2022)   Humiliation, Afraid, Rape, and Kick questionnaire   . Fear of  Current or Ex-Partner: No   . Emotionally Abused: No   . Physically Abused: No   . Sexually Abused: No    Outpatient Medications Prior to Visit  Medication Sig Dispense Refill  . acetaminophen (TYLENOL) 500 MG tablet Take 1,000 mg by mouth every 6 (six) hours as needed for mild pain or headache.     . albuterol (VENTOLIN HFA) 108 (90 Base) MCG/ACT inhaler Inhale 2 puffs into the lungs every 4 (four) hours as needed. 1 each 0  . ALPRAZolam (XANAX) 0.25 MG tablet Take 1 tablet by mouth 3 (three) times daily as needed.    Marland Kitchen amLODipine (NORVASC) 5 MG tablet Take 1 tablet (5 mg total) by mouth daily. 90 tablet 3  . aspirin EC 81 MG tablet Take 1 tablet (81 mg total) by mouth daily. 90 tablet 3  . atenolol (TENORMIN) 50 MG tablet Take 1 tablet (50 mg total) by mouth 2 (two) times daily. 180 tablet 1  . Cyanocobalamin (VITAMIN B-12) 5000 MCG TBDP Take 1 tablet by mouth daily with breakfast.    . denosumab (PROLIA) 60 MG/ML SOSY injection Inject 60 mg into the skin every 6 (six) months.    . DULoxetine (CYMBALTA) 30 MG capsule Take 1 capsule (30 mg total) by mouth daily. 30 capsule 1  . DULoxetine (CYMBALTA) 60 MG capsule Take 1 capsule (60 mg total) by mouth 2 (two) times daily. 180 capsule 1  . Evolocumab (REPATHA SURECLICK) 161 MG/ML SOAJ Inject 140 mg into the skin every 14 (fourteen)  days. 6 mL 1  . fluticasone (FLONASE) 50 MCG/ACT nasal spray Use 2 spray(s) in each nostril once daily 16 g 5  . fluticasone (FLOVENT HFA) 110 MCG/ACT inhaler Inhale 2 puffs into the lungs 2 (two) times daily. 1 each 5  . furosemide (LASIX) 20 MG tablet Take 1 tablet (20 mg total) by mouth as needed for edema. 90 tablet 3  . gabapentin (NEURONTIN) 300 MG capsule Take 1-2 capsules (300-600 mg total) by mouth 3 (three) times daily.    . hydroxychloroquine (PLAQUENIL) 200 MG tablet 1 tablet with food or milk    . Meth-Hyo-M Bl-Na Phos-Ph Sal (URIBEL) 118 MG CAPS Take by mouth.    . Multiple Vitamins-Minerals  (MULTIVITAMIN WITH MINERALS) tablet Take 1 tablet by mouth daily.    . nitroGLYCERIN (NITROSTAT) 0.4 MG SL tablet Place 1 tablet (0.4 mg total) under the tongue every 5 (five) minutes as needed for chest pain. 25 tablet 1  . nystatin cream (MYCOSTATIN) Apply 1 application topically 2 (two) times daily. 30 g 2  . pantoprazole (PROTONIX) 40 MG tablet Take 1 tablet by mouth once daily 90 tablet 1  . pentosan polysulfate (ELMIRON) 100 MG capsule Take 1 capsule (100 mg total) by mouth 3 (three) times daily. Reported on 01/05/2016 (Patient taking differently: Take 100 mg by mouth 3 (three) times daily with meals as needed. Reported on 01/05/2016) 90 capsule 11  . polyethylene glycol (MIRALAX) 17 g packet Take 17 g by mouth daily. 14 each 0  . sucralfate (CARAFATE) 1 GM/10ML suspension Take 10 mLs (1 g total) by mouth 4 (four) times daily -  with meals and at bedtime. 420 mL 1  . traZODone (DESYREL) 100 MG tablet Take 1 tablet (100 mg total) by mouth at bedtime. 90 tablet 1   No facility-administered medications prior to visit.    Allergies  Allergen Reactions  . Clindamycin Rash and Shortness Of Breath  . Clindamycin Hcl Shortness Of Breath and Rash  . Penicillins Anaphylaxis  . Rosuvastatin Anaphylaxis  . Baclofen Other (See Comments) and Rash  . Lincomycin Other (See Comments)  . Prednisone Rash    Other reaction(s): Rash, Hives - can tolerate if she takes with benadryl   . Clindamycin Hcl     Other reaction(s): Shortness of Breath, Rash  . Codeine Hives and Other (See Comments)    headache Other reaction(s): Headache, Nausea, headache  . Erythromycin Hives  . Erythromycin Base Other (See Comments)    other  . Fluzone [Influenza Virus Vaccine] Other (See Comments)    Local reaction at the site  . Haemophilus Influenzae Other (See Comments)    Local reaction at the site Local reaction at the site  . Haemophilus Influenzae Vaccines Other (See Comments)    Local reaction at site  . Latex  Hives  . Levofloxacin     Other reaction(s): sick  . Other Other (See Comments)    Local reaction at site  . Pentazocine Other (See Comments)  . Pentazocine Lactate Other (See Comments)    HALLUCINATION  . Pneumococcal Vaccine Hives and Swelling    Other reaction(s): Hives, Shortness of Breath  . Pneumococcal Vaccine Polyvalent Hives, Swelling and Other (See Comments)    REACTION: redness, swelling, and hives at injection site  . Tamoxifen Nausea And Vomiting and Other (See Comments)    HEADACHE Other reaction(s): Headache, Nausea    Review of Systems  Constitutional:  Negative for fever and malaise/fatigue.  HENT:  Negative for congestion.  Eyes:  Negative for blurred vision.  Respiratory:  Negative for shortness of breath.   Cardiovascular:  Negative for chest pain, palpitations and leg swelling.  Gastrointestinal:  Negative for abdominal pain, blood in stool and nausea.  Genitourinary:  Negative for dysuria and frequency.  Musculoskeletal:  Negative for falls.  Skin:  Negative for rash.  Neurological:  Negative for dizziness, loss of consciousness and headaches.  Endo/Heme/Allergies:  Negative for environmental allergies.  Psychiatric/Behavioral:  Negative for depression. The patient is not nervous/anxious.        Objective:    Physical Exam Constitutional:      General: She is not in acute distress.    Appearance: Normal appearance. She is well-developed. She is not toxic-appearing.  HENT:     Head: Normocephalic and atraumatic.     Right Ear: External ear normal.     Left Ear: External ear normal.     Nose: Nose normal.  Eyes:     General:        Right eye: No discharge.        Left eye: No discharge.     Conjunctiva/sclera: Conjunctivae normal.  Neck:     Thyroid: No thyromegaly.  Cardiovascular:     Rate and Rhythm: Normal rate and regular rhythm.     Heart sounds: Normal heart sounds. No murmur heard. Pulmonary:     Effort: Pulmonary effort is normal.  No respiratory distress.     Breath sounds: Normal breath sounds.  Abdominal:     General: Bowel sounds are normal.     Palpations: Abdomen is soft.     Tenderness: There is no abdominal tenderness. There is no guarding.  Musculoskeletal:        General: Normal range of motion.     Cervical back: Neck supple.  Lymphadenopathy:     Cervical: No cervical adenopathy.  Skin:    General: Skin is warm and dry.  Neurological:     Mental Status: She is alert and oriented to person, place, and time.  Psychiatric:        Mood and Affect: Mood normal.        Behavior: Behavior normal.        Thought Content: Thought content normal.        Judgment: Judgment normal.    BP 110/64 (BP Location: Right Arm, Patient Position: Sitting, Cuff Size: Normal)   Pulse (!) 118   Temp (!) 97.5 F (36.4 C) (Oral)   Resp 16   Ht '5\' 4"'$  (1.626 m)   Wt 133 lb (60.3 kg)   SpO2 95%   BMI 22.83 kg/m  Wt Readings from Last 3 Encounters:  01/22/23 133 lb (60.3 kg)  12/20/22 133 lb (60.3 kg)  12/13/22 136 lb 6.4 oz (61.9 kg)    Diabetic Foot Exam - Simple   No data filed    Lab Results  Component Value Date   WBC 9.9 10/27/2022   HGB 12.0 10/27/2022   HCT 37.8 10/27/2022   PLT 386.0 10/27/2022   GLUCOSE 86 10/27/2022   CHOL 144 03/21/2022   TRIG 88.0 03/21/2022   HDL 47.50 03/21/2022   LDLDIRECT 117.3 04/08/2012   LDLCALC 79 03/21/2022   ALT 7 10/27/2022   AST 13 10/27/2022   NA 138 10/27/2022   K 4.3 10/27/2022   CL 100 10/27/2022   CREATININE 1.05 10/27/2022   BUN 16 10/27/2022   CO2 30 10/27/2022   TSH 3.22 03/21/2022   INR 1.05 08/03/2010  HGBA1C 5.9 10/27/2022   MICROALBUR 0.2 07/10/2013    Lab Results  Component Value Date   TSH 3.22 03/21/2022   Lab Results  Component Value Date   WBC 9.9 10/27/2022   HGB 12.0 10/27/2022   HCT 37.8 10/27/2022   MCV 87.5 10/27/2022   PLT 386.0 10/27/2022   Lab Results  Component Value Date   NA 138 10/27/2022   K 4.3 10/27/2022    CHLORIDE 102 04/20/2015   CO2 30 10/27/2022   GLUCOSE 86 10/27/2022   BUN 16 10/27/2022   CREATININE 1.05 10/27/2022   BILITOT 0.4 10/27/2022   ALKPHOS 125 (H) 10/27/2022   AST 13 10/27/2022   ALT 7 10/27/2022   PROT 6.4 10/27/2022   ALBUMIN 4.0 10/27/2022   CALCIUM 9.2 10/27/2022   ANIONGAP 12 (H) 04/20/2015   EGFR 49 (L) 04/20/2015   GFR 51.32 (L) 10/27/2022   Lab Results  Component Value Date   CHOL 144 03/21/2022   Lab Results  Component Value Date   HDL 47.50 03/21/2022   Lab Results  Component Value Date   LDLCALC 79 03/21/2022   Lab Results  Component Value Date   TRIG 88.0 03/21/2022   Lab Results  Component Value Date   CHOLHDL 3 03/21/2022   Lab Results  Component Value Date   HGBA1C 5.9 10/27/2022       Assessment & Plan:  Chronic renal impairment, unspecified CKD stage Assessment & Plan: Hydrate and monitor   Essential hypertension Assessment & Plan: Monitor and report any concerns, no changes to meds. Encouraged heart healthy diet such as the DASH diet and exercise as tolerated   Hyperglycemia Assessment & Plan: hgba1c acceptable, minimize simple carbs. Increase exercise as tolerated.    Hyperlipidemia, unspecified hyperlipidemia type Assessment & Plan: Encourage heart healthy diet such as MIND or DASH diet, increase exercise, avoid trans fats, simple carbohydrates and processed foods, consider a krill or fish or flaxseed oil cap daily. Statin intolerant   Statin intolerance Assessment & Plan: Tolerating Repatha   Vitamin B12 deficiency Assessment & Plan: Supplement and monitor     [name], MD

## 2023-01-22 NOTE — Assessment & Plan Note (Signed)
Working with PT and ortho, tramadol not working as well has added back Tylenol and that has helped

## 2023-01-22 NOTE — Patient Instructions (Signed)
Mucinex twice daily Probiotic Miralax add Beneifiber daily and after a few days if bowels not improving do that twice daily and let us know results  Encouraged increased hydration and fiber in diet. Daily probiotics. If bowels not moving can use MOM 2 tbls po in 4 oz of warm prune juice by mouth every 3 days. If no results then repeat in 4 hours with  Dulcolax suppository pr, may repeat again in 4 more hours as needed. Seek care if symptoms worsen. Consider daily Miralax and/or Dulcolax if symptoms persist.

## 2023-01-23 ENCOUNTER — Other Ambulatory Visit: Payer: Self-pay | Admitting: Family Medicine

## 2023-01-23 ENCOUNTER — Other Ambulatory Visit: Payer: Self-pay

## 2023-01-23 ENCOUNTER — Ambulatory Visit: Payer: Medicare Other | Admitting: Physical Therapy

## 2023-01-23 ENCOUNTER — Encounter: Payer: Self-pay | Admitting: Physical Therapy

## 2023-01-23 DIAGNOSIS — R2681 Unsteadiness on feet: Secondary | ICD-10-CM

## 2023-01-23 DIAGNOSIS — R252 Cramp and spasm: Secondary | ICD-10-CM

## 2023-01-23 DIAGNOSIS — M6281 Muscle weakness (generalized): Secondary | ICD-10-CM | POA: Diagnosis not present

## 2023-01-23 DIAGNOSIS — M542 Cervicalgia: Secondary | ICD-10-CM | POA: Diagnosis not present

## 2023-01-23 DIAGNOSIS — R42 Dizziness and giddiness: Secondary | ICD-10-CM

## 2023-01-23 DIAGNOSIS — S0993XD Unspecified injury of face, subsequent encounter: Secondary | ICD-10-CM

## 2023-01-23 DIAGNOSIS — M545 Low back pain, unspecified: Secondary | ICD-10-CM | POA: Diagnosis not present

## 2023-01-23 DIAGNOSIS — G8929 Other chronic pain: Secondary | ICD-10-CM

## 2023-01-23 MED ORDER — VITAMIN D (ERGOCALCIFEROL) 1.25 MG (50000 UNIT) PO CAPS: 50000.0000 [IU] | ORAL_CAPSULE | ORAL | 3 refills | Status: DC

## 2023-01-23 NOTE — Telephone Encounter (Signed)
Pt came in picked up
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
## 2023-01-25 ENCOUNTER — Ambulatory Visit (HOSPITAL_BASED_OUTPATIENT_CLINIC_OR_DEPARTMENT_OTHER): Payer: Medicare Other

## 2023-01-25 DIAGNOSIS — R748 Abnormal levels of other serum enzymes: Secondary | ICD-10-CM

## 2023-01-25 HISTORY — DX: Abnormal levels of other serum enzymes: R74.8

## 2023-01-25 NOTE — Progress Notes (Signed)
  Care Coordination Note  01/25/2023 Name: [name] MRN: [id number] DOB: 1946/12/01  [name] is a 77 y.o. year old female who is a primary care patient of Mosie Lukes, MD and is actively engaged with the care management team. I reached out to [name] by phone today to assist with re-scheduling a follow up visit with the RN Case Manager  Follow up plan: We have been unable to make contact with the patient for follow up.    Julian Hy, [place] Direct Dial: [phone]

## 2023-01-25 NOTE — Assessment & Plan Note (Signed)
Fractionated segments normal likely due to trauma

## 2023-01-26 ENCOUNTER — Encounter: Payer: Self-pay | Admitting: Physical Therapy

## 2023-01-26 ENCOUNTER — Ambulatory Visit (INDEPENDENT_AMBULATORY_CARE_PROVIDER_SITE_OTHER): Payer: Medicare Other

## 2023-01-26 ENCOUNTER — Ambulatory Visit: Payer: Medicare Other | Attending: Family Medicine | Admitting: Physical Therapy

## 2023-01-26 DIAGNOSIS — M542 Cervicalgia: Secondary | ICD-10-CM | POA: Diagnosis not present

## 2023-01-26 DIAGNOSIS — R2681 Unsteadiness on feet: Secondary | ICD-10-CM | POA: Diagnosis not present

## 2023-01-26 DIAGNOSIS — G8929 Other chronic pain: Secondary | ICD-10-CM | POA: Diagnosis not present

## 2023-01-26 DIAGNOSIS — S0993XD Unspecified injury of face, subsequent encounter: Secondary | ICD-10-CM | POA: Diagnosis not present

## 2023-01-26 DIAGNOSIS — R42 Dizziness and giddiness: Secondary | ICD-10-CM | POA: Insufficient documentation

## 2023-01-26 DIAGNOSIS — M545 Low back pain, unspecified: Secondary | ICD-10-CM | POA: Diagnosis not present

## 2023-01-26 DIAGNOSIS — R252 Cramp and spasm: Secondary | ICD-10-CM | POA: Diagnosis not present

## 2023-01-26 DIAGNOSIS — M6281 Muscle weakness (generalized): Secondary | ICD-10-CM | POA: Insufficient documentation

## 2023-01-26 DIAGNOSIS — S0993XA Unspecified injury of face, initial encounter: Secondary | ICD-10-CM | POA: Diagnosis not present
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
## 2023-01-30 ENCOUNTER — Ambulatory Visit: Payer: Medicare Other

## 2023-02-01 ENCOUNTER — Ambulatory Visit: Payer: Medicare Other | Admitting: Psychologist

## 2023-02-05 ENCOUNTER — Ambulatory Visit (INDEPENDENT_AMBULATORY_CARE_PROVIDER_SITE_OTHER): Payer: Medicare Other | Admitting: Family

## 2023-02-05 VITALS — BP 126/65 | HR 72 | Temp 99.0°F | Resp 16 | Wt 132.0 lb

## 2023-02-05 DIAGNOSIS — B002 Herpesviral gingivostomatitis and pharyngotonsillitis: Secondary | ICD-10-CM | POA: Diagnosis not present

## 2023-02-05 DIAGNOSIS — M5416 Radiculopathy, lumbar region: Secondary | ICD-10-CM | POA: Diagnosis not present

## 2023-02-05 DIAGNOSIS — M5136 Other intervertebral disc degeneration, lumbar region: Secondary | ICD-10-CM | POA: Diagnosis not present

## 2023-02-05 DIAGNOSIS — T50905A Adverse effect of unspecified drugs, medicaments and biological substances, initial encounter: Secondary | ICD-10-CM | POA: Diagnosis not present

## 2023-02-05 DIAGNOSIS — M47896 Other spondylosis, lumbar region: Secondary | ICD-10-CM | POA: Diagnosis not present

## 2023-02-05 DIAGNOSIS — N644 Mastodynia: Secondary | ICD-10-CM | POA: Diagnosis not present

## 2023-02-05 DIAGNOSIS — N61 Mastitis without abscess: Secondary | ICD-10-CM

## 2023-02-05 DIAGNOSIS — K148 Other diseases of tongue: Secondary | ICD-10-CM

## 2023-02-05 HISTORY — DX: Other diseases of tongue: K14.8

## 2023-02-05 HISTORY — DX: Herpesviral gingivostomatitis and pharyngotonsillitis: B00.2

## 2023-02-05 HISTORY — DX: Mastitis without abscess: N61.0

## 2023-02-05 MED ORDER — VALACYCLOVIR HCL 1 G PO TABS: ORAL_TABLET | ORAL | 0 refills | Status: DC

## 2023-02-05 MED ORDER — SULFAMETHOXAZOLE-TRIMETHOPRIM 800-160 MG PO TABS: 1.0000 | ORAL_TABLET | Freq: Two times a day (BID) | ORAL | 0 refills | Status: DC

## 2023-02-05 NOTE — Progress Notes (Signed)
Subjective:     Patient ID: [name], female    DOB: [birth year], 77 y.o.   MRN: [id number]  Chief Complaint  Patient presents with   Cellulitis    Complains of skin redness throughout. Started on right breast.     HPI Patient is in today to discuss rash.  She reports that over the last 6 months she had a fracture. Notes head itches, itching, "itches like crazy" at night.  Not much itching during the day.  Notes that she developed a fever blister 2 weeks ago and it is not improved.   10 days after that she developed a dime sized lesion on the right side of her tongue.  Notes that it has continued to get larger. It is not painful.    Notes that on Friday she developed rash on right breast, then left breast now notes rash "all over."  Notes that she feels drained and nauseated.   Health Maintenance Due  Topic Date Due   Diabetic kidney evaluation - Urine ACR  07/10/2014   COVID-19 Vaccine (5 - 2023-24 season) 08/25/2022   Medicare Annual Wellness (AWV)  03/01/2023    Past Medical History:  Diagnosis Date   Allergy    Anemia    Anxiety    past hx    Arthritis    Asthma    Atrial flutter ([place])    past history- not current   Breast cancer ([place])    CAD (coronary artery disease) CARDIOLOGIST--  DR Angelena Form   mild non-obstructive cad   Cancer ([place])    right   Cataract    bilaterally removed    Chronic constipation    Chronic kidney disease    stage 3 ckd, interstitial cystitis   Concussion    x 3   COPD (chronic obstructive pulmonary disease) ([place])    Depression    past hx    Family history of malignant hyperthermia    father had this   Fibromyalgia    Frequency of urination    GERD (gastroesophageal reflux disease)    H/O hiatal hernia    History of basal cell carcinoma excision    X2   History of breast cancer ONCOLOGIST-- DR Jana Hakim---  NO RECURRANCE   DX 07/2012;  LOW GRADE DCIS  ER+PR+  ----  S/P RIGHT LUMPECTOMY WITH NEGATIVE MARGINS/   RADIATION ENDED  11/2012   History of chronic bronchitis    History of colonic polyps    History of tachycardia    CONTROLLED  WITH ATENOLOL   Hyperlipidemia    Hypertension    Neuromuscular disorder (HCC)    fibromyalgia   Neuropathy    Osteoporosis 01/2019   T score -2.2 stable/improved from prior study   Pelvic pain    Personal history of radiation therapy    S/P radiation therapy 11/12/12 - 12/05/12   right Breast   Sepsis ([place]) 2014   from UTI    Sinus headache    Sjogren's disease ([place])    Urgency of urination     Past Surgical History:  Procedure Laterality Date   56 HOUR [place] STUDY N/A 04/03/2016   Procedure: 24 HOUR [place] STUDY;  Surgeon: Manus Gunning, MD;  Location: [facility];  Service: Gastroenterology;  Laterality: N/A;   BREAST BIOPSY Right 08/23/2012   ADH   BREAST BIOPSY Right 10/11/2012   Ductal Carcinoma   BREAST EXCISIONAL BIOPSY Right 09/04/2013   benign   BREAST LUMPECTOMY Right 10-11-2012  W/ SLN BX   CARDIAC CATHETERIZATION  09-13-2007  DR Lia Foyer   WELL-PRESERVED LVF/  DIFFUSE SCATTERED CORONARY CALCIFACATION AND ATHEROSCLEROSIS WITHOUT OBSTRUCTION   CARDIAC CATHETERIZATION  08-04-2010  DR MCALHANY   NON-OBSTRUCTIVE CAD/  pLAD 40%/  oLAD 30%/  mLAD 30%/  pRCA 30%/  EF 60%   CARDIOVASCULAR STRESS TEST  06-18-2012  DR McALHANY   LOW RISK NUCLEAR STUDY/  SMALL FIXED AREA OF MODERATELY DECREASED UPTAKE IN ANTEROSEPTAL WALL WHICH MAY BE ARTIFACTUAL/  NO ISCHEMIA/  EF 68%   COLONOSCOPY  09-29-2010   CYSTOSCOPY     CYSTOSCOPY WITH HYDRODISTENSION AND BIOPSY N/A 03/06/2014   Procedure: CYSTOSCOPY/HYDRODISTENSION/ INSTILATION OF MARCAINE AND PYRIDIUM;  Surgeon: Ailene Rud, MD;  Location: [facility];  Service: Urology;  Laterality: N/A;   ESOPHAGEAL MANOMETRY N/A 04/03/2016   Procedure: ESOPHAGEAL MANOMETRY (EM);  Surgeon: Manus Gunning, MD;  Location: [facility];  Service: Gastroenterology;  Laterality: N/A;   EXTRACORPOREAL SHOCK  WAVE LITHOTRIPSY Left 02/06/2019   Procedure: EXTRACORPOREAL SHOCK WAVE LITHOTRIPSY (ESWL);  Surgeon: Kathie Rhodes, MD;  Location: [facility];  Service: Urology;  Laterality: Left;   NASAL SINUS SURGERY  1985   ORIF RIGHT ANKLE  FX  2006   POLYPECTOMY     REMOVAL VOCAL CORD CYST  FEB 2014   RIGHT BREAST BX  08-23-2012   RIGHT HAND SURGERY  X3  LAST ONE 2009   INCLUDES  ORIF RIGHT 5TH FINGER AND REVISION TWICE   SKIN CANCER EXCISION     TONSILLECTOMY AND ADENOIDECTOMY  AGE 34   TOTAL ABDOMINAL HYSTERECTOMY W/ BILATERAL SALPINGOOPHORECTOMY  1982   W/  APPENDECTOMY   TRANSTHORACIC ECHOCARDIOGRAM  06-24-2012   GRADE I DIASTOLIC DYSFUNCTION/  EF 55-60%/  MILD MR   UPPER GASTROINTESTINAL ENDOSCOPY      Family History  Problem Relation Age of Onset   Rectal cancer Mother    Colon cancer Mother    Pancreatic cancer Mother    Diabetes Mother    Breast cancer Mother 43   Breast cancer Maternal Aunt        breast   Birth defects Maternal Aunt    Irritable bowel syndrome Son    Heart disease Son        CAD, MV replacement   Allergic Disorder Daughter    Diabetes Daughter    Colon cancer Father    Colon polyps Father    Diabetes Father    Stroke Father    Heart disease Father        CHF   Hyperlipidemia Father    Hypertension Father    Arthritis Father    Breast cancer Paternal Aunt    Breast cancer Paternal Aunt    Arthritis Paternal Uncle    Allergic Disorder Daughter    Heart disease Cousin        CAD, had a blood clot after stenting   Esophageal cancer Neg Hx    Stomach cancer Neg Hx     Social History   Socioeconomic History   Marital status: Married    Spouse name: Jori Moll    Number of children: 3   Years of education: BA   Highest education level: Not on file  Occupational History   Occupation: Retired Programmer, multimedia: RETIRED  Tobacco Use   Smoking status: Former    Packs/day: 1.00    Years: 15.00    Total pack years: 15.00    Types: Cigarettes  Quit date:  05/27/2005    Years since quitting: 17.7   Smokeless tobacco: Never  Vaping Use   Vaping Use: Never used  Substance and Sexual Activity   Alcohol use: No    Alcohol/week: 0.0 standard drinks of alcohol   Drug use: No   Sexual activity: Not Currently    Partners: Male    Birth control/protection: Surgical    Comment: 1st intercourse 77 yo-Fewer than 5 partners, hysterectomy  Other Topics Concern   Not on file  Social History Narrative   Lives with husband.   Caffeine use: 1/2 cup per day   Exercise-- 2days a week [employer],  Water aerobics, walking       Retired Marine scientist   No dietary restrictions, tries to maintain a heart healthy diet   Social Determinants of Health   Financial Resource Strain: Low Risk  (02/28/2022)   Overall Financial Resource Strain (CARDIA)    Difficulty of Paying Living Expenses: Not hard at all  Food Insecurity: No Food Insecurity (11/02/2022)   Hunger Vital Sign    Worried About Running Out of Food in the Last Year: Never true    Ran Out of Food in the Last Year: Never true  Transportation Needs: No Transportation Needs (11/02/2022)   PRAPARE - Hydrologist (Medical): No    Lack of Transportation (Non-Medical): No  Physical Activity: Not on file  Stress: Stress Concern Present (11/14/2022)   [place]    Feeling of Stress : To some extent  Social Connections: Socially Integrated (02/28/2022)   Social Connection and Isolation Panel [NHANES]    Frequency of Communication with Friends and Family: More than three times a week    Frequency of Social Gatherings with Friends and Family: More than three times a week    Attends Religious Services: More than 4 times per year    Active Member of [employer] or Organizations: Yes    Attends Music therapist: More than 4 times per year    Marital Status: Married  Human resources officer Violence: Not At Risk (02/28/2022)    Humiliation, Afraid, Rape, and Kick questionnaire    Fear of Current or Ex-Partner: No    Emotionally Abused: No    Physically Abused: No    Sexually Abused: No    Outpatient Medications Prior to Visit  Medication Sig Dispense Refill   acetaminophen (TYLENOL) 500 MG tablet Take 1,000 mg by mouth every 6 (six) hours as needed for mild pain or headache.      albuterol (VENTOLIN HFA) 108 (90 Base) MCG/ACT inhaler Inhale 2 puffs into the lungs every 4 (four) hours as needed. 1 each 0   amLODipine (NORVASC) 5 MG tablet Take 1 tablet (5 mg total) by mouth daily. 90 tablet 3   aspirin EC 81 MG tablet Take 1 tablet (81 mg total) by mouth daily. 90 tablet 3   atenolol (TENORMIN) 50 MG tablet Take 1 tablet (50 mg total) by mouth 2 (two) times daily. 180 tablet 1   Cyanocobalamin (VITAMIN B-12) 5000 MCG TBDP Take 1 tablet by mouth daily with breakfast.     denosumab (PROLIA) 60 MG/ML SOSY injection Inject 60 mg into the skin every 6 (six) months.     DULoxetine (CYMBALTA) 30 MG capsule Take 1 capsule (30 mg total) by mouth daily. 30 capsule 1   DULoxetine (CYMBALTA) 60 MG capsule Take 1 capsule (60 mg total) by mouth 2 (  two) times daily. 180 capsule 1   Evolocumab (REPATHA SURECLICK) XX123456 MG/ML SOAJ Inject 140 mg into the skin every 14 (fourteen) days. 6 mL 1   fluticasone (FLONASE) 50 MCG/ACT nasal spray Use 2 spray(s) in each nostril once daily 16 g 5   fluticasone (FLOVENT HFA) 110 MCG/ACT inhaler Inhale 2 puffs into the lungs 2 (two) times daily. 1 each 5   furosemide (LASIX) 20 MG tablet Take 1 tablet (20 mg total) by mouth as needed for edema. 90 tablet 3   gabapentin (NEURONTIN) 300 MG capsule Take 1-2 capsules (300-600 mg total) by mouth 3 (three) times daily.     hydroxychloroquine (PLAQUENIL) 200 MG tablet 1 tablet with food or milk     Hyoscyamine Sulfate SL 0.125 MG SUBL Place 1 tablet (0.125 mg total) under the tongue 3 (three) times daily as needed. 90 tablet 2   Multiple Vitamins-Minerals  (MULTIVITAMIN WITH MINERALS) tablet Take 1 tablet by mouth daily.     nitroGLYCERIN (NITROSTAT) 0.4 MG SL tablet Place 1 tablet (0.4 mg total) under the tongue every 5 (five) minutes as needed for chest pain. 25 tablet 1   nystatin cream (MYCOSTATIN) Apply 1 application topically 2 (two) times daily. 30 g 2   pantoprazole (PROTONIX) 40 MG tablet Take 1 tablet by mouth once daily 90 tablet 1   pentosan polysulfate (ELMIRON) 100 MG capsule Take 1 capsule (100 mg total) by mouth 3 (three) times daily. Reported on 01/05/2016 (Patient taking differently: Take 100 mg by mouth 3 (three) times daily with meals as needed. Reported on 01/05/2016) 90 capsule 11   polyethylene glycol (MIRALAX) 17 g packet Take 17 g by mouth daily. 14 each 0   sucralfate (CARAFATE) 1 GM/10ML suspension Take 10 mLs (1 g total) by mouth 4 (four) times daily -  with meals and at bedtime. 420 mL 1   traZODone (DESYREL) 100 MG tablet Take 1 tablet (100 mg total) by mouth at bedtime. 90 tablet 1   Vitamin D, Ergocalciferol, (DRISDOL) 1.25 MG (50000 UNIT) CAPS capsule Take 1 capsule (50,000 Units total) by mouth every 7 (seven) days. 12x Weeks 5 capsule 3   doxycycline (VIBRA-TABS) 100 MG tablet Take 1 tablet (100 mg total) by mouth 2 (two) times daily. 28 tablet 0   ALPRAZolam (XANAX) 0.25 MG tablet Take 1 tablet by mouth 3 (three) times daily as needed.     No facility-administered medications prior to visit.    Allergies  Allergen Reactions   Clindamycin Rash and Shortness Of Breath   Clindamycin Hcl Shortness Of Breath and Rash   Penicillins Anaphylaxis   Rosuvastatin Anaphylaxis   Baclofen Other (See Comments) and Rash   Lincomycin Other (See Comments)   Prednisone Rash    Other reaction(s): Rash, Hives - can tolerate if she takes with benadryl    Clindamycin Hcl     Other reaction(s): Shortness of Breath, Rash   Codeine Hives and Other (See Comments)    headache Other reaction(s): Headache, Nausea, headache    Doxycycline     Skin rash   Erythromycin Hives   Erythromycin Base Other (See Comments)    other   Fluzone [Influenza Virus Vaccine] Other (See Comments)    Local reaction at the site   Haemophilus Influenzae Other (See Comments)    Local reaction at the site Local reaction at the site   Haemophilus Influenzae Vaccines Other (See Comments)    Local reaction at site   Latex Hives  Levofloxacin     Other reaction(s): sick   Other Other (See Comments)    Local reaction at site   Pentazocine Other (See Comments)   Pentazocine Lactate Other (See Comments)    HALLUCINATION   Pneumococcal Vaccine Hives and Swelling    Other reaction(s): Hives, Shortness of Breath   Pneumococcal Vaccine Polyvalent Hives, Swelling and Other (See Comments)    REACTION: redness, swelling, and hives at injection site   Tamoxifen Nausea And Vomiting and Other (See Comments)    HEADACHE Other reaction(s): Headache, Nausea    ROS See HPI    Objective:    Physical Exam Constitutional:      General: She is not in acute distress.    Appearance: Normal appearance. She is well-developed.  HENT:     Head: Normocephalic and atraumatic.     Comments: Glossly lesion right distal lateral tonue (absence of visible taste buds)  Small herpes lesion right upper lip    Right Ear: External ear normal.     Left Ear: External ear normal.  Eyes:     General: No scleral icterus. Neck:     Thyroid: No thyromegaly.  Cardiovascular:     Rate and Rhythm: Normal rate and regular rhythm.     Heart sounds: Normal heart sounds. No murmur heard. Pulmonary:     Effort: Pulmonary effort is normal. No respiratory distress.     Breath sounds: Normal breath sounds. No wheezing.  Chest:     Comments: Right breast surgical changes noted. Some thickening noted overlying surgical scars but no discreet masses palpated.  She is very tender to palpation at 9 oclock.  Musculoskeletal:     Cervical back: Neck supple.  Skin:     General: Skin is warm and dry.     Comments: Erythema right breast Diffuse rash noted on arms/legs/trunk  Neurological:     Mental Status: She is alert and oriented to person, place, and time.  Psychiatric:        Mood and Affect: Mood normal.        Behavior: Behavior normal.        Thought Content: Thought content normal.        Judgment: Judgment normal.     BP 126/65 (BP Location: Left Arm, Patient Position: Sitting, Cuff Size: Small)   Pulse 72   Temp 99 F (37.2 C) (Oral)   Resp 16   Wt 132 lb (59.9 kg)   SpO2 99%   BMI 22.66 kg/m  Wt Readings from Last 3 Encounters:  02/05/23 132 lb (59.9 kg)  01/22/23 133 lb (60.3 kg)  12/20/22 133 lb (60.3 kg)      Assessment & Plan:   Problem List Items Addressed This Visit       Unprioritized   Tongue lesion    New. Refer to ENT for further evaluation.       Relevant Orders   Ambulatory referral to ENT   Oral herpes - Primary    Persistent lesion right upper lip. Will rx with valtrex. Last BUN/Cr WNL.       Relevant Medications   valACYclovir (VALTREX) 1000 MG tablet   sulfamethoxazole-trimethoprim (BACTRIM DS) 800-160 MG tablet   Drug reaction    New. I believe rash is due to doxy reaction. D/c doxy. Also likely cause for her c/o nausea. Recommended claritin or zyrtec prn during the day. Can take benadryl as needed at night for itching.       Cellulitis of breast  New.  She is intolerant to doxycline and has multiple other allergies. Will rx with bactrim and send for diagnostic mammo/US.        Relevant Medications   sulfamethoxazole-trimethoprim (BACTRIM DS) 800-160 MG tablet   Other Relevant Orders   MM Digital Diagnostic Unilat R   US BREAST LTD UNI RIGHT INC AXILLA   Breast pain    New. Previous hx of breast cancer right breast with lumpectomy. Will refer for diagnostic mammo and [place].       40 minutes spent on today's visit. Time was spent interviewing patient and formulating medical plan.  I have  discontinued Christean Grief. Galyon's ALPRAZolam and doxycycline. I am also having her start on valACYclovir and sulfamethoxazole-trimethoprim. Additionally, I am having her maintain her acetaminophen, aspirin EC, pentosan polysulfate, denosumab, polyethylene glycol, fluticasone, Vitamin B-12, nystatin cream, albuterol, furosemide, hydroxychloroquine, nitroGLYCERIN, atenolol, DULoxetine, DULoxetine, sucralfate, fluticasone, gabapentin, multivitamin with minerals, amLODipine, Repatha SureClick, traZODone, pantoprazole, Hyoscyamine Sulfate SL, and Vitamin D (Ergocalciferol).  Meds ordered this encounter  Medications   valACYclovir (VALTREX) 1000 MG tablet    Sig: Take 2 tabs by mouth now and again  in 12 hours.    Dispense:  4 tablet    Refill:  0    Order Specific Question:   Supervising Provider    Answer:   [name] A [[id number]]   sulfamethoxazole-trimethoprim (BACTRIM DS) 800-160 MG tablet    Sig: Take 1 tablet by mouth 2 (two) times daily.    Dispense:  14 tablet    Refill:  0    Order Specific Question:   Supervising Provider    Answer:   [name] A [id number]

## 2023-02-05 NOTE — Patient Instructions (Signed)
Stop doxycycline. Start Bactrim for right breast possible cellulitis. Begin Valtrex for cold sore. You should be contacted about your referral to ENT. Stop Doxycycline due to rash and nausea. You may use claritin or zyrtec during the day and benadryl as needed at night for itching.

## 2023-02-05 NOTE — Assessment & Plan Note (Signed)
New. Previous hx of breast cancer right breast with lumpectomy. Will refer for diagnostic mammo and [place].

## 2023-02-05 NOTE — Assessment & Plan Note (Addendum)
New. I believe rash is due to doxy reaction. D/c doxy. Also likely cause for her c/o nausea. Recommended claritin or zyrtec prn during the day. Can take benadryl as needed at night for itching.

## 2023-02-05 NOTE — Assessment & Plan Note (Signed)
New.  She is intolerant to doxycline and has multiple other allergies. Will rx with bactrim and send for diagnostic mammo/US.

## 2023-02-05 NOTE — Assessment & Plan Note (Signed)
New. Refer to ENT for further evaluation.

## 2023-02-05 NOTE — Assessment & Plan Note (Signed)
Persistent lesion right upper lip. Will rx with valtrex. Last BUN/Cr WNL.

## 2023-02-06 ENCOUNTER — Ambulatory Visit: Payer: Medicare Other | Admitting: Physical Therapy

## 2023-02-08 ENCOUNTER — Ambulatory Visit (INDEPENDENT_AMBULATORY_CARE_PROVIDER_SITE_OTHER): Payer: Medicare Other | Admitting: Family Medicine

## 2023-02-08 ENCOUNTER — Encounter: Payer: Self-pay | Admitting: Family Medicine

## 2023-02-08 VITALS — BP 128/73 | HR 74 | Temp 98.1°F | Resp 16 | Wt 131.0 lb

## 2023-02-08 DIAGNOSIS — R1011 Right upper quadrant pain: Secondary | ICD-10-CM | POA: Diagnosis not present

## 2023-02-08 NOTE — Progress Notes (Signed)
   Acute Office Visit  Subjective:     Patient ID: [name], female    DOB: Mar 05, 1946, 77 y.o.   MRN: [id number]  Chief Complaint  Patient presents with   green stool   Back Pain    HPI Patient is in today for bowel changes and nausea.   Patient reports that for the past 3-4 days, she has noticed her stools appear more green and odorous than usual. States BMs are formed and soft, but narrow. She has been having some associated RUQ pain and nausea causing poor appetite. States her back has been bothering her as well, but she is following with Dr. Nelva Bush for lumbar spondylosis and planning to have a procedure in a few weeks. She denies any fevers, chills, constipation, diarrhea, urinary changes.    ROS All review of systems negative except what is listed in the HPI      Objective:    BP 128/73   Pulse 74   Temp 98.1 F (36.7 C)   Resp 16   Wt 131 lb (59.4 kg)   SpO2 100%   BMI 22.49 kg/m    Physical Exam Vitals reviewed.  Constitutional:      Appearance: Normal appearance.  Cardiovascular:     Rate and Rhythm: Normal rate and regular rhythm.     Pulses: Normal pulses.     Heart sounds: Normal heart sounds.  Pulmonary:     Effort: Pulmonary effort is normal.     Breath sounds: Normal breath sounds.  Abdominal:     General: Abdomen is flat. Bowel sounds are increased.     Palpations: Abdomen is soft.     Tenderness: There is abdominal tenderness in the right upper quadrant. There is no right CVA tenderness, left CVA tenderness or rebound. Positive signs include Murphy's sign. Negative signs include McBurney's sign.  Skin:    General: Skin is warm and dry.  Neurological:     Mental Status: She is alert and oriented to person, place, and time.  Psychiatric:        Mood and Affect: Mood normal.        Behavior: Behavior normal.        Thought Content: Thought content normal.        Judgment: Judgment normal.       No results found for any visits on  02/08/23.      Assessment & Plan:   Problem List Items Addressed This Visit       Other   RUQ pain - Primary Starting with labs and [place] RUQ - we will update her with results Recommend gentle/bland diet for now Patient aware of signs/symptoms requiring further/urgent evaluation.    Relevant Orders   Comprehensive metabolic panel   CBC with Differential/Platelet   Lipase   US Abdomen Limited RUQ (LIVER/GB)    No orders of the defined types were placed in this encounter.   Return if symptoms worsen or fail to improve.  Terrilyn Saver, NP

## 2023-02-08 NOTE — Patient Instructions (Signed)
Blood work and ultrasound ordered Recommend gentle diet for now We will let you know results and plan

## 2023-02-09 ENCOUNTER — Ambulatory Visit (HOSPITAL_BASED_OUTPATIENT_CLINIC_OR_DEPARTMENT_OTHER)
Admission: RE | Admit: 2023-02-09 | Discharge: 2023-02-09 | Disposition: A | Discharge status: Home / Self Care | Payer: Medicare Other | Source: Ambulatory Visit | Attending: Family Medicine | Admitting: Family Medicine

## 2023-02-09 ENCOUNTER — Ambulatory Visit: Payer: Medicare Other | Admitting: Physical Therapy

## 2023-02-09 DIAGNOSIS — R1011 Right upper quadrant pain: Secondary | ICD-10-CM | POA: Diagnosis not present

## 2023-02-09 LAB — CBC WITH DIFFERENTIAL/PLATELET
Basophils Absolute: 0.1 10*3/uL (ref 0.0–0.1)
Basophils Relative: 1.2 % (ref 0.0–3.0)
Eosinophils Absolute: 0.1 10*3/uL (ref 0.0–0.7)
Eosinophils Relative: 1.4 % (ref 0.0–5.0)
HCT: 38 % (ref 36.0–46.0)
Hemoglobin: 12.3 g/dL (ref 12.0–15.0)
Lymphocytes Relative: 20.7 % (ref 12.0–46.0)
Lymphs Abs: 1.9 10*3/uL (ref 0.7–4.0)
MCHC: 32.4 g/dL (ref 30.0–36.0)
MCV: 87.2 fl (ref 78.0–100.0)
Monocytes Absolute: 0.9 10*3/uL (ref 0.1–1.0)
Monocytes Relative: 9.3 % (ref 3.0–12.0)
Neutro Abs: 6.3 10*3/uL (ref 1.4–7.7)
Neutrophils Relative %: 67.4 % (ref 43.0–77.0)
Platelets: 365 10*3/uL (ref 150.0–400.0)
RBC: 4.36 Mil/uL (ref 3.87–5.11)
RDW: 18 % — ABNORMAL HIGH (ref 11.5–15.5)
WBC: 9.4 10*3/uL (ref 4.0–10.5)

## 2023-02-09 LAB — COMPREHENSIVE METABOLIC PANEL
ALT: 8 U/L (ref 0–35)
AST: 16 U/L (ref 0–37)
Albumin: 3.9 g/dL (ref 3.5–5.2)
Alkaline Phosphatase: 141 U/L — ABNORMAL HIGH (ref 39–117)
BUN: 15 mg/dL (ref 6–23)
CO2: 28 mEq/L (ref 19–32)
Calcium: 9.4 mg/dL (ref 8.4–10.5)
Chloride: 101 mEq/L (ref 96–112)
Creatinine, Ser: 1.15 mg/dL (ref 0.40–1.20)
GFR: 45.92 mL/min — ABNORMAL LOW (ref >60.00)
Glucose, Bld: 84 mg/dL (ref 70–99)
Potassium: 4.7 mEq/L (ref 3.5–5.1)
Sodium: 137 mEq/L (ref 135–145)
Total Bilirubin: 0.5 mg/dL (ref 0.2–1.2)
Total Protein: 6.5 g/dL (ref 6.0–8.3)

## 2023-02-09 LAB — LIPASE: Lipase: 14 U/L (ref 11.0–59.0)

## 2023-02-12 ENCOUNTER — Telehealth: Payer: Self-pay | Admitting: Gastroenterology

## 2023-02-12 DIAGNOSIS — H9203 Otalgia, bilateral: Secondary | ICD-10-CM | POA: Diagnosis not present

## 2023-02-12 DIAGNOSIS — R0982 Postnasal drip: Secondary | ICD-10-CM | POA: Diagnosis not present

## 2023-02-12 DIAGNOSIS — H608X3 Other otitis externa, bilateral: Secondary | ICD-10-CM | POA: Diagnosis not present

## 2023-02-12 DIAGNOSIS — R519 Headache, unspecified: Secondary | ICD-10-CM | POA: Diagnosis not present

## 2023-02-12 NOTE — Telephone Encounter (Signed)
Inbound call from patient stating that she called our practice this morning and made an appointment for 3/19 at 9:00 with [facility]. Patient stated that she wanted to speak to the nurse to see if it was okay if she waited that long because she is having right upper abdominal pains and green stools. Patient is requesting a call back to discuss. Please advise.

## 2023-02-12 NOTE — Addendum Note (Signed)
Addended by: Caleen Jobs B on: 02/12/2023 08:28 AM   Modules accepted: Orders

## 2023-02-12 NOTE — Progress Notes (Signed)
Subjective:   By signing my name below, I, Marlana Latus, attest that this documentation has been prepared under the direction and in the presence of Nance Pear, NP 02/13/23   Patient ID: [name], female    DOB: 1946/01/21, 77 y.o.   MRN: [id number]  Chief Complaint  Patient presents with   Cellulitis    Here for follow up on cellulitis of the breast    HPI Patient is in today for a follow up.   Tongue lesion: She reports the lesion on the right side of her tongue is still there. She denies any pain, but she continues to feel it.   Oral herpes: She continues to have a persistent lesion on her right upper lip. She endorses pain and feels as if it "pulls" her lip a bit.   Breast skin irritation: She reports her rash on her right breast has improved. She is meant to have a mammogram soon but was not able to schedule one due to issues with referral.   GI: She endorses pain associated with eating. She has been eating smaller portion sizes and has noticed this tends to help avoid pain.   ENT referral: She has not been able to schedule an appointment with the ENT. She reports she has not heard from them.   Past Medical History:  Diagnosis Date   Allergy    Anemia    Anxiety    past hx    Arthritis    Asthma    Atrial flutter ([place])    past history- not current   Breast cancer ([place])    CAD (coronary artery disease) CARDIOLOGIST--  DR Angelena Form   mild non-obstructive cad   Cancer ([place])    right   Cataract    bilaterally removed    Chronic constipation    Chronic kidney disease    stage 3 ckd, interstitial cystitis   Concussion    x 3   COPD (chronic obstructive pulmonary disease) ([place])    Depression    past hx    Family history of malignant hyperthermia    father had this   Fibromyalgia    Frequency of urination    GERD (gastroesophageal reflux disease)    H/O hiatal hernia    History of basal cell carcinoma excision    X2   History of breast cancer  ONCOLOGIST-- DR Jana Hakim---  NO RECURRANCE   DX 07/2012;  LOW GRADE DCIS  ER+PR+  ----  S/P RIGHT LUMPECTOMY WITH NEGATIVE MARGINS/   RADIATION ENDED 11/2012   History of chronic bronchitis    History of colonic polyps    History of tachycardia    CONTROLLED  WITH ATENOLOL   Hyperlipidemia    Hypertension    Neuromuscular disorder (HCC)    fibromyalgia   Neuropathy    Osteoporosis 01/2019   T score -2.2 stable/improved from prior study   Pelvic pain    Personal history of radiation therapy    S/P radiation therapy 11/12/12 - 12/05/12   right Breast   Sepsis ([place]) 2014   from UTI    Sinus headache    Sjogren's disease ([place])    Urgency of urination     Past Surgical History:  Procedure Laterality Date   97 HOUR [place] STUDY N/A 04/03/2016   Procedure: 24 HOUR [place] STUDY;  Surgeon: Manus Gunning, MD;  Location: [facility];  Service: Gastroenterology;  Laterality: N/A;   BREAST BIOPSY Right 08/23/2012   ADH  BREAST BIOPSY Right 10/11/2012   Ductal Carcinoma   BREAST EXCISIONAL BIOPSY Right 09/04/2013   benign   BREAST LUMPECTOMY Right 10-11-2012   W/ SLN BX   CARDIAC CATHETERIZATION  09-13-2007  DR Lia Foyer   WELL-PRESERVED LVF/  DIFFUSE SCATTERED CORONARY CALCIFACATION AND ATHEROSCLEROSIS WITHOUT OBSTRUCTION   CARDIAC CATHETERIZATION  08-04-2010  DR MCALHANY   NON-OBSTRUCTIVE CAD/  pLAD 40%/  oLAD 30%/  mLAD 30%/  pRCA 30%/  EF 60%   CARDIOVASCULAR STRESS TEST  06-18-2012  DR McALHANY   LOW RISK NUCLEAR STUDY/  SMALL FIXED AREA OF MODERATELY DECREASED UPTAKE IN ANTEROSEPTAL WALL WHICH MAY BE ARTIFACTUAL/  NO ISCHEMIA/  EF 68%   COLONOSCOPY  09-29-2010   CYSTOSCOPY     CYSTOSCOPY WITH HYDRODISTENSION AND BIOPSY N/A 03/06/2014   Procedure: CYSTOSCOPY/HYDRODISTENSION/ INSTILATION OF MARCAINE AND PYRIDIUM;  Surgeon: Ailene Rud, MD;  Location: [facility];  Service: Urology;  Laterality: N/A;   ESOPHAGEAL MANOMETRY N/A 04/03/2016   Procedure:  ESOPHAGEAL MANOMETRY (EM);  Surgeon: Manus Gunning, MD;  Location: [facility];  Service: Gastroenterology;  Laterality: N/A;   EXTRACORPOREAL SHOCK WAVE LITHOTRIPSY Left 02/06/2019   Procedure: EXTRACORPOREAL SHOCK WAVE LITHOTRIPSY (ESWL);  Surgeon: Kathie Rhodes, MD;  Location: [facility];  Service: Urology;  Laterality: Left;   NASAL SINUS SURGERY  1985   ORIF RIGHT ANKLE  FX  2006   POLYPECTOMY     REMOVAL VOCAL CORD CYST  FEB 2014   RIGHT BREAST BX  08-23-2012   RIGHT HAND SURGERY  X3  LAST ONE 2009   INCLUDES  ORIF RIGHT 5TH FINGER AND REVISION TWICE   SKIN CANCER EXCISION     TONSILLECTOMY AND ADENOIDECTOMY  AGE 34   TOTAL ABDOMINAL HYSTERECTOMY W/ BILATERAL SALPINGOOPHORECTOMY  1982   W/  APPENDECTOMY   TRANSTHORACIC ECHOCARDIOGRAM  06-24-2012   GRADE I DIASTOLIC DYSFUNCTION/  EF 55-60%/  MILD MR   UPPER GASTROINTESTINAL ENDOSCOPY      Family History  Problem Relation Age of Onset   Rectal cancer Mother    Colon cancer Mother    Pancreatic cancer Mother    Diabetes Mother    Breast cancer Mother 70   Breast cancer Maternal Aunt        breast   Birth defects Maternal Aunt    Irritable bowel syndrome Son    Heart disease Son        CAD, MV replacement   Allergic Disorder Daughter    Diabetes Daughter    Colon cancer Father    Colon polyps Father    Diabetes Father    Stroke Father    Heart disease Father        CHF   Hyperlipidemia Father    Hypertension Father    Arthritis Father    Breast cancer Paternal Aunt    Breast cancer Paternal Aunt    Arthritis Paternal Uncle    Allergic Disorder Daughter    Heart disease Cousin        CAD, had a blood clot after stenting   Esophageal cancer Neg Hx    Stomach cancer Neg Hx     Social History   Socioeconomic History   Marital status: Married    Spouse name: Jori Moll    Number of children: 3   Years of education: BA   Highest education level: Not on file  Occupational History   Occupation: Retired Facilities manager: RETIRED  Tobacco Use   Smoking  status: Former    Packs/day: 1.00    Years: 15.00    Total pack years: 15.00    Types: Cigarettes    Quit date: 05/27/2005    Years since quitting: 17.7   Smokeless tobacco: Never  Vaping Use   Vaping Use: Never used  Substance and Sexual Activity   Alcohol use: No    Alcohol/week: 0.0 standard drinks of alcohol   Drug use: No   Sexual activity: Not Currently    Partners: Male    Birth control/protection: Surgical    Comment: 1st intercourse 77 yo-Fewer than 5 partners, hysterectomy  Other Topics Concern   Not on file  Social History Narrative   Lives with husband.   Caffeine use: 1/2 cup per day   Exercise-- 2days a week [employer],  Water aerobics, walking       Retired Marine scientist   No dietary restrictions, tries to maintain a heart healthy diet   Social Determinants of Health   Financial Resource Strain: Low Risk  (02/28/2022)   Overall Financial Resource Strain (CARDIA)    Difficulty of Paying Living Expenses: Not hard at all  Food Insecurity: No Food Insecurity (11/02/2022)   Hunger Vital Sign    Worried About Running Out of Food in the Last Year: Never true    Ran Out of Food in the Last Year: Never true  Transportation Needs: No Transportation Needs (11/02/2022)   PRAPARE - Hydrologist (Medical): No    Lack of Transportation (Non-Medical): No  Physical Activity: Not on file  Stress: Stress Concern Present (11/14/2022)   [place]    Feeling of Stress : To some extent  Social Connections: Socially Integrated (02/28/2022)   Social Connection and Isolation Panel [NHANES]    Frequency of Communication with Friends and Family: More than three times a week    Frequency of Social Gatherings with Friends and Family: More than three times a week    Attends Religious Services: More than 4 times per year    Active Member of [employer] or Organizations: Yes     Attends Music therapist: More than 4 times per year    Marital Status: Married  Human resources officer Violence: Not At Risk (02/28/2022)   Humiliation, Afraid, Rape, and Kick questionnaire    Fear of Current or Ex-Partner: No    Emotionally Abused: No    Physically Abused: No    Sexually Abused: No    Outpatient Medications Prior to Visit  Medication Sig Dispense Refill   acetaminophen (TYLENOL) 500 MG tablet Take 1,000 mg by mouth every 6 (six) hours as needed for mild pain or headache.      albuterol (VENTOLIN HFA) 108 (90 Base) MCG/ACT inhaler Inhale 2 puffs into the lungs every 4 (four) hours as needed. 1 each 0   amLODipine (NORVASC) 5 MG tablet Take 1 tablet (5 mg total) by mouth daily. 90 tablet 3   aspirin EC 81 MG tablet Take 1 tablet (81 mg total) by mouth daily. 90 tablet 3   atenolol (TENORMIN) 50 MG tablet Take 1 tablet (50 mg total) by mouth 2 (two) times daily. 180 tablet 1   Cyanocobalamin (VITAMIN B-12) 5000 MCG TBDP Take 1 tablet by mouth daily with breakfast.     denosumab (PROLIA) 60 MG/ML SOSY injection Inject 60 mg into the skin every 6 (six) months.     DULoxetine (CYMBALTA) 30 MG capsule Take 1  capsule (30 mg total) by mouth daily. 30 capsule 1   DULoxetine (CYMBALTA) 60 MG capsule Take 1 capsule (60 mg total) by mouth 2 (two) times daily. 180 capsule 1   Evolocumab (REPATHA SURECLICK) XX123456 MG/ML SOAJ Inject 140 mg into the skin every 14 (fourteen) days. 6 mL 1   fluticasone (FLONASE) 50 MCG/ACT nasal spray Use 2 spray(s) in each nostril once daily 16 g 5   fluticasone (FLOVENT HFA) 110 MCG/ACT inhaler Inhale 2 puffs into the lungs 2 (two) times daily. 1 each 5   furosemide (LASIX) 20 MG tablet Take 1 tablet (20 mg total) by mouth as needed for edema. 90 tablet 3   gabapentin (NEURONTIN) 300 MG capsule Take 1-2 capsules (300-600 mg total) by mouth 3 (three) times daily.     hydroxychloroquine (PLAQUENIL) 200 MG tablet 1 tablet with food or milk      Hyoscyamine Sulfate SL 0.125 MG SUBL Place 1 tablet (0.125 mg total) under the tongue 3 (three) times daily as needed. 90 tablet 2   Multiple Vitamins-Minerals (MULTIVITAMIN WITH MINERALS) tablet Take 1 tablet by mouth daily.     nitroGLYCERIN (NITROSTAT) 0.4 MG SL tablet Place 1 tablet (0.4 mg total) under the tongue every 5 (five) minutes as needed for chest pain. 25 tablet 1   nystatin cream (MYCOSTATIN) Apply 1 application topically 2 (two) times daily. 30 g 2   pantoprazole (PROTONIX) 40 MG tablet Take 1 tablet by mouth once daily 90 tablet 1   pentosan polysulfate (ELMIRON) 100 MG capsule Take 1 capsule (100 mg total) by mouth 3 (three) times daily. Reported on 01/05/2016 (Patient taking differently: Take 100 mg by mouth 3 (three) times daily with meals as needed. Reported on 01/05/2016) 90 capsule 11   polyethylene glycol (MIRALAX) 17 g packet Take 17 g by mouth daily. 14 each 0   sucralfate (CARAFATE) 1 GM/10ML suspension Take 10 mLs (1 g total) by mouth 4 (four) times daily -  with meals and at bedtime. 420 mL 1   traZODone (DESYREL) 100 MG tablet Take 1 tablet (100 mg total) by mouth at bedtime. 90 tablet 1   valACYclovir (VALTREX) 1000 MG tablet Take 2 tabs by mouth now and again  in 12 hours. 4 tablet 0   Vitamin D, Ergocalciferol, (DRISDOL) 1.25 MG (50000 UNIT) CAPS capsule Take 1 capsule (50,000 Units total) by mouth every 7 (seven) days. 12x Weeks 5 capsule 3   sulfamethoxazole-trimethoprim (BACTRIM DS) 800-160 MG tablet Take 1 tablet by mouth 2 (two) times daily. 14 tablet 0   No facility-administered medications prior to visit.    Allergies  Allergen Reactions   Clindamycin Rash and Shortness Of Breath   Clindamycin Hcl Shortness Of Breath and Rash   Penicillins Anaphylaxis   Rosuvastatin Anaphylaxis   Baclofen Other (See Comments) and Rash   Lincomycin Other (See Comments)   Prednisone Rash    Other reaction(s): Rash, Hives - can tolerate if she takes with benadryl     Clindamycin Hcl     Other reaction(s): Shortness of Breath, Rash   Codeine Hives and Other (See Comments)    headache Other reaction(s): Headache, Nausea, headache   Doxycycline     Skin rash   Erythromycin Hives   Erythromycin Base Other (See Comments)    other   Fluzone [Influenza Virus Vaccine] Other (See Comments)    Local reaction at the site   Haemophilus Influenzae Other (See Comments)    Local reaction at the site  Local reaction at the site   Haemophilus Influenzae Vaccines Other (See Comments)    Local reaction at site   Latex Hives   Levofloxacin     Other reaction(s): sick   Other Other (See Comments)    Local reaction at site   Pentazocine Other (See Comments)   Pentazocine Lactate Other (See Comments)    HALLUCINATION   Pneumococcal Vaccine Hives and Swelling    Other reaction(s): Hives, Shortness of Breath   Pneumococcal Vaccine Polyvalent Hives, Swelling and Other (See Comments)    REACTION: redness, swelling, and hives at injection site   Tamoxifen Nausea And Vomiting and Other (See Comments)    HEADACHE Other reaction(s): Headache, Nausea    ROS    See HPI Objective:    Physical Exam Constitutional:      Appearance: Normal appearance.  HENT:     Head:     Comments: Annular lesion right lateral tongue  Mild erythematous spot above right lip    Mouth/Throat:     Mouth: Mucous membranes are moist.  Pulmonary:     Effort: Pulmonary effort is normal.  Chest:     Comments: Mild skin irritation on right breast  Skin:    General: Skin is warm and dry.  Neurological:     Mental Status: She is alert and oriented to person, place, and time.  Psychiatric:        Mood and Affect: Mood normal.        Behavior: Behavior normal.        Thought Content: Thought content normal.        Judgment: Judgment normal.     BP 118/62 (BP Location: Left Arm, Patient Position: Sitting, Cuff Size: Small)   Pulse 70   Temp 97.8 F (36.6 C) (Oral)   Resp 16   Wt  132 lb (59.9 kg)   SpO2 99%   BMI 22.66 kg/m  Wt Readings from Last 3 Encounters:  02/13/23 132 lb (59.9 kg)  02/08/23 131 lb (59.4 kg)  02/05/23 132 lb (59.9 kg)       Assessment & Plan:  Elevated alkaline phosphatase level Assessment & Plan: Chronic, check isoenzymes for further evaluation.   Orders: -     Alkaline phosphatase, isoenzymes; Future  Common bile duct dilatation Assessment & Plan: Incidental finding on [place]. She has a GI consultation pending for tomorrow. She does note some post prandial abdominal pain.    Cellulitis of breast Assessment & Plan: Improved, I still would like for her to follow through with breast imaging ordered last visit. I have given her the number to call to schedule. She is advised to call if recurrent redness/warmth.    Tongue lesion Assessment & Plan: Unchanged. She has not heard back from ENT. I gave her number to call to schedule.    Oral herpes Assessment & Plan: I am not sure if this is a definite herpes lesion versus other skin irritation of the lip.  I have advised her to discuss with ENT at her visit if it does not resolve.       I,Rachel Rivera,acting as a Education administrator for Nance Pear, NP.,have documented all relevant documentation on the behalf of Nance Pear, NP,as directed by  Nance Pear, NP while in the presence of Nance Pear, NP.   I, Nance Pear, NP, personally preformed the services described in this documentation.  All medical record entries made by the scribe were at my direction and  in my presence.  I have reviewed the chart and discharge instructions (if applicable) and agree that the record reflects my personal performance and is accurate and complete. 02/13/23   Nance Pear, NP

## 2023-02-12 NOTE — Telephone Encounter (Signed)
Reviewed pt's chart, pt seen by PCP on 02/08/23 - recent labs and [place] completed. Lipase was normal. Called and spoke with patient and reassured her that since lipase is normal it is not suggestive that she has any pancreatic inflammation. Pt is concerned because her mother passed away from pancreatic cancer. Pt also reports "green stools, which she has never had before". Patient's appt has been moved up to Wednesday, 02/14/23 at 1:30 pm. Pt verbalized understanding and had no concerns at the end of the call.

## 2023-02-13 ENCOUNTER — Other Ambulatory Visit: Payer: Self-pay | Admitting: *Deleted

## 2023-02-13 ENCOUNTER — Ambulatory Visit: Payer: Medicare Other | Admitting: Physical Therapy

## 2023-02-13 ENCOUNTER — Other Ambulatory Visit: Payer: Self-pay | Admitting: Family Medicine

## 2023-02-13 ENCOUNTER — Ambulatory Visit (INDEPENDENT_AMBULATORY_CARE_PROVIDER_SITE_OTHER): Payer: Medicare Other | Admitting: Family

## 2023-02-13 ENCOUNTER — Other Ambulatory Visit: Payer: Medicare Other

## 2023-02-13 VITALS — BP 118/62 | HR 70 | Temp 97.8°F | Resp 16 | Wt 132.0 lb

## 2023-02-13 DIAGNOSIS — M542 Cervicalgia: Secondary | ICD-10-CM

## 2023-02-13 DIAGNOSIS — K838 Other specified diseases of biliary tract: Secondary | ICD-10-CM

## 2023-02-13 DIAGNOSIS — R42 Dizziness and giddiness: Secondary | ICD-10-CM

## 2023-02-13 DIAGNOSIS — N61 Mastitis without abscess: Secondary | ICD-10-CM

## 2023-02-13 DIAGNOSIS — R2681 Unsteadiness on feet: Secondary | ICD-10-CM

## 2023-02-13 DIAGNOSIS — M6281 Muscle weakness (generalized): Secondary | ICD-10-CM

## 2023-02-13 DIAGNOSIS — R252 Cramp and spasm: Secondary | ICD-10-CM | POA: Diagnosis not present

## 2023-02-13 DIAGNOSIS — K148 Other diseases of tongue: Secondary | ICD-10-CM | POA: Diagnosis not present

## 2023-02-13 DIAGNOSIS — G8929 Other chronic pain: Secondary | ICD-10-CM

## 2023-02-13 DIAGNOSIS — B002 Herpesviral gingivostomatitis and pharyngotonsillitis: Secondary | ICD-10-CM

## 2023-02-13 DIAGNOSIS — R748 Abnormal levels of other serum enzymes: Secondary | ICD-10-CM

## 2023-02-13 DIAGNOSIS — M545 Low back pain, unspecified: Secondary | ICD-10-CM | POA: Diagnosis not present

## 2023-02-13 HISTORY — DX: Other specified diseases of biliary tract: K83.8

## 2023-02-13 NOTE — Assessment & Plan Note (Signed)
Improved, I still would like for her to follow through with breast imaging ordered last visit. I have given her the number to call to schedule. She is advised to call if recurrent redness/warmth.

## 2023-02-13 NOTE — Assessment & Plan Note (Signed)
Chronic, check isoenzymes for further evaluation.
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
## 2023-02-13 NOTE — Assessment & Plan Note (Signed)
I am not sure if this is a definite herpes lesion versus other skin irritation of the lip.  I have advised her to discuss with ENT at her visit if it does not resolve.

## 2023-02-13 NOTE — Assessment & Plan Note (Signed)
Unchanged. She has not heard back from ENT. I gave her number to call to schedule.

## 2023-02-13 NOTE — Assessment & Plan Note (Addendum)
Incidental finding on [place]. She has a GI consultation pending for tomorrow. She does note some post prandial abdominal pain.

## 2023-02-13 NOTE — Progress Notes (Addendum)
02/13/2023 [name] [id number] Apr 14, 1946   Chief Complaint: [name] stools, abdominal pain   History of Present Illness: [name] is a 77 year old female with a past medical history of of anxiety, depression, arthritis, asthma, COPD, coronary artery disease, atrial flutter, breast cancer s/p right breast lumpectomy and radiation in 2013 interstitial cystitis and colon polyps. She is followed by Dr. Havery [name].  She presents today with complaints of variable abdominal pain and a change in bowel pattern.  She endorses having LLQ pain which comes and goes for the past 8 to 9 months which is sometimes more noticeable after eating but does not improve or worsen after defecation.  Sometimes, the lower abdominal pain radiates to the RL Q area which is not severe.  She developed RUQ pain about 10 days ago which wraps around to her upper back with nausea and no vomiting.  She is passing dark green small finger sized stools for the past 2 weeks, previously passed normal formed brown stools.  No rectal bleeding or black stools.  She was prescribed doxycycline 3 weeks ago for sinus infection then Bactrim for breast cellulitis 2 weeks ago.  She was seen by her PCP and RUQ sonogram was negative for gallstones but showed the CBD was mildly dilated to 9 mm which could be physiologic and was unchanged from prior image studies.  Labs 02/08/2023 showed an elevated alk phos level 141 with normal total bili 0.5, AST 16 and ALT 8. Mother was diagnosed with pancreatic cancer at the age of 67.  She is a retired Therapist, sports.      Latest Ref Rng & Units 02/08/2023    3:01 PM 01/22/2023    9:42 AM 10/27/2022   10:09 AM  CBC  WBC 4.0 - 10.5 K/uL 9.4  8.5  9.9   Hemoglobin 12.0 - 15.0 g/dL 12.3  11.6  12.0   Hematocrit 36.0 - 46.0 % 38.0  36.0  37.8   Platelets 150.0 - 400.0 K/uL 365.0  343.0  386.0        Latest Ref Rng & Units 02/08/2023    3:01 PM 01/22/2023    9:42 AM 10/27/2022   10:09 AM  CMP  Glucose 70 - 99  mg/dL 84  92  86   BUN 6 - 23 mg/dL '15  11  16   '$ Creatinine 0.40 - 1.20 mg/dL 1.15  1.00  1.05   Sodium 135 - 145 mEq/L 137  138  138   Potassium 3.5 - 5.1 mEq/L 4.7  4.3  4.3   Chloride 96 - 112 mEq/L 101  102  100   CO2 19 - 32 mEq/L '28  28  30   '$ Calcium 8.4 - 10.5 mg/dL 9.4  8.7  9.2   Total Protein 6.0 - 8.3 g/dL 6.5  6.2  6.4   Total Bilirubin 0.2 - 1.2 mg/dL 0.5  0.4  0.4   Alkaline Phos 39 - 117 U/L 141  108  125   AST 0 - 37 U/L '16  14  13   '$ ALT 0 - 35 U/L '8  7  7      '$ RUQ 02/11/2023: FINDINGS: Gallbladder: No gallstones or wall thickening visualized. No sonographic Murphy sign noted by sonographer.   Common bile duct: Diameter: 9 mm, dilated.   Liver: No focal lesion identified. Within normal limits in parenchymal echogenicity. Portal vein is patent on color Doppler imaging with normal direction of blood flow towards the  liver.    IMPRESSION: 1. No cholelithiasis or sonographic evidence for acute cholecystitis. 2. Common bile duct is dilated measuring up to 9 mm. This may be physiologic given patient's age and appears similar when compared with prior exams. Recommend correlation with LFTs to assess for biliary obstruction. If abnormal, recommend MRI/MRCP.  PAST GI PROCEDURES:    EGD 12/24/2018:  - Esophagogastric landmarks identified. - Normal esophagus - empiric dilation performed to 85m with small mucosal wrent at the UES - Erythematous mucosa in the gastric body. - Normal duodenal bulb and second portion of the duodenum. - Biopsies were taken with a cold forceps for Helicobacter pylori testing.   Colonoscopy 12/24/2018: - One 4 mm polyp in the ascending colon, removed with a cold snare. Resected and retrieved. - One 4 mm polyp in the transverse colon, removed with a cold snare. Resected and retrieved. - One 4 mm polyp in the descending colon, removed with a cold snare. Resected and retrieved. - One 3 mm polyp in the sigmoid colon, removed with a cold  snare. Resected and retrieved. - Anal papilla(e) were hypertrophied. Biopsied. - Tortuous colon, which prohibited ileal intubation. - Internal hemorrhoids. - Mostly adequate prep, fair in the cecum. - The examination was otherwise normal. No evidence of IBD or fistula. Suspect rectal bleeding is due to hemorrhoids in the setting of constipation. -3-year colonoscopy recall to be considered as the patient would be 75 at that point   1. Surgical [P], gastric antrum and gastric body - MILD REACTIVE GASTROPATHY. - NEGATIVE FOR HELICOBACTER PYLORI. - NO INTESTINAL METAPLASIA, DYSPLASIA, OR MALIGNANCY. 2. Surgical [P], transverse, ascending, sigmoid, polyp (2) - TUBULAR ADENOMA. - SESSILE SERRATED POLYP WITHOUT DYSPLASIA (X2 FRAGMENTS). - NO HIGH GRADE DYSPLASIA OR MALIGNANCY. 3. Surgical [P], anal papilla, polyp - POLYPOID FRAGMENT OF BENIGN SQUAMOUS MUCOSA. - NO DYSPLASIA OR MALIGNANCY.  Current Outpatient Medications on File Prior to Visit  Medication Sig Dispense Refill   acetaminophen (TYLENOL) 500 MG tablet Take 1,000 mg by mouth every 6 (six) hours as needed for mild pain or headache.      albuterol (VENTOLIN HFA) 108 (90 Base) MCG/ACT inhaler Inhale 2 puffs into the lungs every 4 (four) hours as needed. 1 each 0   amLODipine (NORVASC) 5 MG tablet Take 1 tablet (5 mg total) by mouth daily. 90 tablet 3   aspirin EC 81 MG tablet Take 1 tablet (81 mg total) by mouth daily. 90 tablet 3   atenolol (TENORMIN) 25 MG tablet Take 25 mg by mouth 2 (two) times daily.     Cyanocobalamin (VITAMIN B-12) 5000 MCG TBDP Take 1 tablet by mouth daily with breakfast.     denosumab (PROLIA) 60 MG/ML SOSY injection Inject 60 mg into the skin every 6 (six) months.     diclofenac Sodium (VOLTAREN) 1 % GEL Apply 2 g topically as needed.     Docusate Sodium (DSS) 100 MG CAPS Take 1 capsule by mouth as needed.     DULoxetine (CYMBALTA) 30 MG capsule Take 1 capsule (30 mg total) by mouth daily. 30 capsule 1    DULoxetine (CYMBALTA) 60 MG capsule Take 1 capsule by mouth twice daily 180 capsule 0   Estradiol 10 MCG TABS vaginal tablet Place 1 tablet vaginally as needed.     Evolocumab (REPATHA SURECLICK) 1XX123456MG/ML SOAJ Inject 140 mg into the skin every 14 (fourteen) days. 6 mL 1   fluticasone (FLONASE) 50 MCG/ACT nasal spray Use 2 spray(s) in each nostril once daily  16 g 5   fluticasone (FLOVENT HFA) 110 MCG/ACT inhaler Inhale 2 puffs into the lungs 2 (two) times daily. 1 each 5   furosemide (LASIX) 20 MG tablet Take 1 tablet (20 mg total) by mouth as needed for edema. 90 tablet 3   gabapentin (NEURONTIN) 300 MG capsule Take 1-2 capsules (300-600 mg total) by mouth 3 (three) times daily.     hydroxychloroquine (PLAQUENIL) 200 MG tablet 1 tablet with food or milk     loratadine (CLARITIN) 10 MG tablet Take 1 tablet by mouth 2 (two) times daily.     mometasone (ELOCON) 0.1 % cream Apply 1 Application topically as needed.     Multiple Vitamins-Minerals (MULTIVITAMIN WITH MINERALS) tablet Take 1 tablet by mouth daily.     nystatin cream (MYCOSTATIN) Apply 1 application topically 2 (two) times daily. 30 g 2   pantoprazole (PROTONIX) 40 MG tablet Take 1 tablet by mouth once daily (Patient taking differently: Take 40 mg by mouth 2 (two) times daily.) 90 tablet 1   pentosan polysulfate (ELMIRON) 100 MG capsule Take 1 capsule (100 mg total) by mouth 3 (three) times daily. Reported on 01/05/2016 (Patient taking differently: Take 100 mg by mouth as needed. Reported on 01/05/2016) 90 capsule 11   polyethylene glycol (MIRALAX) 17 g packet Take 17 g by mouth daily. 14 each 0   rOPINIRole (REQUIP) 0.25 MG tablet Take 0.25 mg by mouth as needed.     sucralfate (CARAFATE) 1 GM/10ML suspension Take 10 mLs (1 g total) by mouth 4 (four) times daily -  with meals and at bedtime. 420 mL 1   traMADol (ULTRAM) 50 MG tablet Take 25 mg by mouth at bedtime.     traZODone (DESYREL) 100 MG tablet Take 1 tablet (100 mg total) by mouth at  bedtime. 90 tablet 1   valACYclovir (VALTREX) 1000 MG tablet Take 2 tabs by mouth now and again  in 12 hours. 4 tablet 0   Vitamin D, Ergocalciferol, (DRISDOL) 1.25 MG (50000 UNIT) CAPS capsule Take 1 capsule (50,000 Units total) by mouth every 7 (seven) days. 12x Weeks 5 capsule 3   ALPRAZolam (XANAX) 0.5 MG tablet Take 0.5 mg by mouth as needed. (Patient not taking: Reported on 02/14/2023)     nitroGLYCERIN (NITROSTAT) 0.4 MG SL tablet Place 1 tablet (0.4 mg total) under the tongue every 5 (five) minutes as needed for chest pain. (Patient not taking: Reported on 02/14/2023) 25 tablet 1   No current facility-administered medications on file prior to visit.   Allergies  Allergen Reactions   Clindamycin Rash and Shortness Of Breath   Clindamycin Hcl Shortness Of Breath and Rash   Penicillins Anaphylaxis   Rosuvastatin Anaphylaxis   Baclofen Other (See Comments) and Rash   Lincomycin Other (See Comments)   Prednisone Rash    Other reaction(s): Rash, Hives - can tolerate if she takes with benadryl    Clindamycin Hcl     Other reaction(s): Shortness of Breath, Rash   Codeine Hives and Other (See Comments)    headache Other reaction(s): Headache, Nausea, headache   Doxycycline     Skin rash   Erythromycin Hives   Erythromycin Base Other (See Comments)    other   Fluzone [Influenza Virus Vaccine] Other (See Comments)    Local reaction at the site   Haemophilus Influenzae Other (See Comments)    Local reaction at the site Local reaction at the site   Haemophilus Influenzae Vaccines Other (See Comments)  Local reaction at site   Latex Hives   Levofloxacin     Other reaction(s): sick   Other Other (See Comments)    Local reaction at site   Pentazocine Other (See Comments)   Pentazocine Lactate Other (See Comments)    HALLUCINATION   Pneumococcal Vaccine Hives and Swelling    Other reaction(s): Hives, Shortness of Breath   Pneumococcal Vaccine Polyvalent Hives, Swelling and Other  (See Comments)    REACTION: redness, swelling, and hives at injection site   Tamoxifen Nausea And Vomiting and Other (See Comments)    HEADACHE Other reaction(s): Headache, Nausea   Current Medications, Allergies, Past Medical History, Past Surgical History, Family History and Social History were reviewed in [employer] record.  Review of Systems:   Constitutional: Negative for fever, sweats, chills or weight loss.  Respiratory: Negative for shortness of breath.   Cardiovascular: Negative for chest pain, palpitations and leg swelling.  Gastrointestinal: See HPI.  Musculoskeletal: Negative for back pain or muscle aches.  Neurological: Negative for dizziness, headaches or paresthesias.   Physical Exam: BP 102/70 (BP Location: Left Arm, Patient Position: Sitting, Cuff Size: Normal)   Pulse 76   Ht 5' 3.5" (1.613 m)   Wt 132 lb 8 oz (60.1 kg)   BMI 23.10 kg/m   General: 77 year old female in no acute distress. Head: Normocephalic and atraumatic. Eyes: No scleral icterus. Conjunctiva pink . Ears: Normal auditory acuity. Mouth: Dentition intact. No ulcers or lesions.  Lungs: Clear throughout to auscultation. Heart: Regular rate and rhythm, no murmur. Abdomen: Soft, nontender and nondistended. No masses or hepatomegaly. Normal bowel sounds x 4 quadrants.  Rectal: Deferred.  Musculoskeletal: Symmetrical with no gross deformities. Extremities: No edema. Neurological: Alert oriented x 4. No focal deficits.  Psychological: Alert and cooperative. Normal mood and affect  Assessment and Recommendations:  36) 77 year old female with RUQ pain and green stools x 2 weeks. Elevated Alk phos level with normal total bili, AST/ALT levels.  RUQ sonogram without gallstones, CBD measured 9 mm. -Hepatic panel, GGT -CTAP with contrast   2) LLQ and RLQ pain -CTAP as ordered above  -CBC, BMP -Dicyclomine 10 mg 1 p.o. twice daily as needed, stop if dizziness or lightheadedness  occurs -IBgard 1 p.o. twice daily for abdominal pain OTC  3) History of tubular adenomatous/sessile serrated colon polyps per colonoscopy 11/2018.  3-year recall considered. -Await CTAP and lab results, defer recommendations regarding any future colon polyp surveillance colonoscopy to Dr. Havery [name]  Patient to contact office if symptoms worsen.  Further recommendations to be determined after the above lab and CTAP results reviewed.

## 2023-02-13 NOTE — Patient Instructions (Addendum)
Please Call ENT to schedule your appointment:   Sent to [facility] [place], [place], [place] [address] Phone: [phone]   Please call the [facility] to schedule your mammogram/ultrasound.   [phone]

## 2023-02-14 ENCOUNTER — Encounter: Payer: Self-pay | Admitting: Nurse Practitioner

## 2023-02-14 ENCOUNTER — Other Ambulatory Visit: Payer: Self-pay | Admitting: Family Medicine

## 2023-02-14 ENCOUNTER — Other Ambulatory Visit (INDEPENDENT_AMBULATORY_CARE_PROVIDER_SITE_OTHER): Payer: Medicare Other

## 2023-02-14 ENCOUNTER — Ambulatory Visit: Payer: Medicare Other | Admitting: Nurse Practitioner

## 2023-02-14 VITALS — BP 102/70 | HR 76 | Ht 63.5 in | Wt 132.5 lb

## 2023-02-14 DIAGNOSIS — R1011 Right upper quadrant pain: Secondary | ICD-10-CM

## 2023-02-14 DIAGNOSIS — R1031 Right lower quadrant pain: Secondary | ICD-10-CM

## 2023-02-14 DIAGNOSIS — R748 Abnormal levels of other serum enzymes: Secondary | ICD-10-CM

## 2023-02-14 LAB — CBC WITH DIFFERENTIAL/PLATELET
Basophils Absolute: 0.1 10*3/uL (ref 0.0–0.1)
Basophils Relative: 1.2 % (ref 0.0–3.0)
Eosinophils Absolute: 0.2 10*3/uL (ref 0.0–0.7)
Eosinophils Relative: 2.4 % (ref 0.0–5.0)
HCT: 38.7 % (ref 36.0–46.0)
Hemoglobin: 12.3 g/dL (ref 12.0–15.0)
Lymphocytes Relative: 18.4 % (ref 12.0–46.0)
Lymphs Abs: 1.8 10*3/uL (ref 0.7–4.0)
MCHC: 31.9 g/dL (ref 30.0–36.0)
MCV: 87.8 fl (ref 78.0–100.0)
Monocytes Absolute: 0.7 10*3/uL (ref 0.1–1.0)
Monocytes Relative: 7.6 % (ref 3.0–12.0)
Neutro Abs: 6.7 10*3/uL (ref 1.4–7.7)
Neutrophils Relative %: 70.4 % (ref 43.0–77.0)
Platelets: 369 10*3/uL (ref 150.0–400.0)
RBC: 4.41 Mil/uL (ref 3.87–5.11)
RDW: 17.9 % — ABNORMAL HIGH (ref 11.5–15.5)
WBC: 9.6 10*3/uL (ref 4.0–10.5)

## 2023-02-14 LAB — GAMMA GT: GGT: 11 U/L (ref 3–65)

## 2023-02-14 MED ORDER — DICYCLOMINE HCL 10 MG PO CAPS: 10.0000 mg | ORAL_CAPSULE | Freq: Two times a day (BID) | ORAL | 0 refills | Status: DC | PRN

## 2023-02-14 NOTE — Progress Notes (Signed)
Agree with assessment / plan as outlined.  

## 2023-02-14 NOTE — Patient Instructions (Signed)
Your provider has requested that you go to the basement level for lab work before leaving today. Press "B" on the elevator. The lab is located at the first door on the left as you exit the elevator.  You have been scheduled for a CT scan of the abdomen and pelvis at [facility], 1st floor Radiology. You are scheduled on 02/19/23 at 2:00 pm. You should arrive 15 minutes prior to your appointment time for registration. Make sure your have nothing to eat or drink 4 hours prior.  We have sent the following medications to your pharmacy for you to pick up at your convenience: Dicyclomine 10 mg  Ibguard- 1 tablet by mouth twice daily   If you have any questions regarding your exam or if you need to reschedule, you may call [facility] at [phone] between the hours of 8:00 am and 5:00 pm, Monday-Friday.   Stop Dicyclomine if dizziness or lightheadedness occurs.    Due to recent changes in healthcare laws, you may see the results of your imaging and laboratory studies on MyChart before your provider has had a chance to review them.  We understand that in some cases there may be results that are confusing or concerning to you. Not all laboratory results come back in the same time frame and the provider may be waiting for multiple results in order to interpret others.  Please give [place] 48 hours in order for your provider to thoroughly review all the results before contacting the office for clarification of your results.    Thank you for trusting me with your gastrointestinal care!   Carl Best, CRNP

## 2023-02-15 ENCOUNTER — Encounter: Payer: Self-pay | Admitting: Family

## 2023-02-15 DIAGNOSIS — N61 Mastitis without abscess: Secondary | ICD-10-CM

## 2023-02-15 LAB — COMPREHENSIVE METABOLIC PANEL
ALT: 9 U/L (ref 0–35)
AST: 18 U/L (ref 0–37)
Albumin: 4 g/dL (ref 3.5–5.2)
Alkaline Phosphatase: 116 U/L (ref 39–117)
BUN: 12 mg/dL (ref 6–23)
CO2: 29 mEq/L (ref 19–32)
Calcium: 9.1 mg/dL (ref 8.4–10.5)
Chloride: 102 mEq/L (ref 96–112)
Creatinine, Ser: 1 mg/dL (ref 0.40–1.20)
GFR: 54.3 mL/min — ABNORMAL LOW (ref >60.00)
Glucose, Bld: 95 mg/dL (ref 70–99)
Potassium: 4.4 mEq/L (ref 3.5–5.1)
Sodium: 139 mEq/L (ref 135–145)
Total Bilirubin: 0.4 mg/dL (ref 0.2–1.2)
Total Protein: 6.9 g/dL (ref 6.0–8.3)

## 2023-02-15 LAB — HEPATIC FUNCTION PANEL
ALT: 9 U/L (ref 0–35)
AST: 18 U/L (ref 0–37)
Albumin: 4 g/dL (ref 3.5–5.2)
Alkaline Phosphatase: 116 U/L (ref 39–117)
Bilirubin, Direct: 0.1 mg/dL (ref 0.0–0.3)
Total Bilirubin: 0.4 mg/dL (ref 0.2–1.2)
Total Protein: 6.9 g/dL (ref 6.0–8.3)

## 2023-02-15 LAB — GAMMA GT: GGT: 10 U/L (ref 7–51)

## 2023-02-16 ENCOUNTER — Other Ambulatory Visit (HOSPITAL_COMMUNITY): Payer: Self-pay

## 2023-02-16 ENCOUNTER — Telehealth: Payer: Self-pay | Admitting: Pharmacy Technician

## 2023-02-16 ENCOUNTER — Ambulatory Visit: Payer: Medicare Other | Admitting: Physical Therapy

## 2023-02-16 NOTE — Telephone Encounter (Signed)
Patient Advocate Encounter  Prior Authorization for DICYCLOMINE '10MG'$  has been approved.    PA# --- Effective dates: 2.23.24 through 2.23.25

## 2023-02-16 NOTE — Telephone Encounter (Signed)
Patient Advocate Encounter  Received notification from [facility] that prior authorization for DICYCLOMINE '10MG'$  is required.   PA submitted on 2.23.24 Key [id number] Status is pending

## 2023-02-19 ENCOUNTER — Other Ambulatory Visit: Payer: Medicare Other

## 2023-02-20 ENCOUNTER — Ambulatory Visit: Payer: Medicare Other | Admitting: Physical Therapy

## 2023-02-20 LAB — ALKALINE PHOSPHATASE, ISOENZYMES
Alkaline Phosphatase: 132 IU/L — ABNORMAL HIGH (ref 44–121)
BONE FRACTION: 45 % (ref 14–68)
INTESTINAL FRAC.: 6 % (ref 0–18)
LIVER FRACTION: 49 % (ref 18–85)

## 2023-02-20 LAB — SPECIMEN STATUS REPORT

## 2023-02-21 DIAGNOSIS — M353 Polymyalgia rheumatica: Secondary | ICD-10-CM | POA: Diagnosis not present

## 2023-02-21 DIAGNOSIS — M25561 Pain in right knee: Secondary | ICD-10-CM | POA: Diagnosis not present

## 2023-02-21 DIAGNOSIS — Z79899 Other long term (current) drug therapy: Secondary | ICD-10-CM | POA: Diagnosis not present

## 2023-02-21 DIAGNOSIS — M810 Age-related osteoporosis without current pathological fracture: Secondary | ICD-10-CM | POA: Diagnosis not present

## 2023-02-21 DIAGNOSIS — M3501 Sicca syndrome with keratoconjunctivitis: Secondary | ICD-10-CM | POA: Diagnosis not present

## 2023-02-21 DIAGNOSIS — M79604 Pain in right leg: Secondary | ICD-10-CM | POA: Diagnosis not present

## 2023-02-21 DIAGNOSIS — M25562 Pain in left knee: Secondary | ICD-10-CM | POA: Diagnosis not present

## 2023-02-21 DIAGNOSIS — M79605 Pain in left leg: Secondary | ICD-10-CM | POA: Diagnosis not present

## 2023-02-22 ENCOUNTER — Ambulatory Visit
Admission: RE | Admit: 2023-02-22 | Discharge: 2023-02-22 | Disposition: A | Discharge status: Home / Self Care | Payer: Medicare Other | Source: Ambulatory Visit | Attending: Family | Admitting: Family

## 2023-02-22 ENCOUNTER — Ambulatory Visit: Payer: Medicare Other

## 2023-02-22 DIAGNOSIS — N61 Mastitis without abscess: Secondary | ICD-10-CM

## 2023-02-22 DIAGNOSIS — N644 Mastodynia: Secondary | ICD-10-CM | POA: Diagnosis not present

## 2023-02-22 DIAGNOSIS — Z853 Personal history of malignant neoplasm of breast: Secondary | ICD-10-CM | POA: Diagnosis not present

## 2023-02-26 ENCOUNTER — Ambulatory Visit (INDEPENDENT_AMBULATORY_CARE_PROVIDER_SITE_OTHER): Payer: Medicare Other

## 2023-02-26 DIAGNOSIS — R748 Abnormal levels of other serum enzymes: Secondary | ICD-10-CM | POA: Diagnosis not present

## 2023-02-26 DIAGNOSIS — R1032 Left lower quadrant pain: Secondary | ICD-10-CM

## 2023-02-26 DIAGNOSIS — R1011 Right upper quadrant pain: Secondary | ICD-10-CM | POA: Diagnosis not present

## 2023-02-26 DIAGNOSIS — I700 Atherosclerosis of aorta: Secondary | ICD-10-CM | POA: Diagnosis not present

## 2023-02-26 DIAGNOSIS — R1031 Right lower quadrant pain: Secondary | ICD-10-CM

## 2023-02-26 DIAGNOSIS — R109 Unspecified abdominal pain: Secondary | ICD-10-CM | POA: Diagnosis not present

## 2023-02-26 MED ORDER — IOHEXOL 300 MG/ML  SOLN
100.0000 mL | Freq: Once | INTRAMUSCULAR | Status: AC | PRN
  Administered 2023-02-26: 85 mL via INTRAVENOUS

## 2023-02-27 ENCOUNTER — Encounter: Payer: Self-pay | Admitting: Physical Therapy

## 2023-02-27 DIAGNOSIS — M47816 Spondylosis without myelopathy or radiculopathy, lumbar region: Secondary | ICD-10-CM | POA: Diagnosis not present

## 2023-03-01 ENCOUNTER — Encounter: Payer: Self-pay | Admitting: Physical Therapy

## 2023-03-01 ENCOUNTER — Ambulatory Visit: Payer: Medicare Other | Attending: Family Medicine | Admitting: Physical Therapy

## 2023-03-01 DIAGNOSIS — M6281 Muscle weakness (generalized): Secondary | ICD-10-CM | POA: Diagnosis not present

## 2023-03-01 DIAGNOSIS — R2681 Unsteadiness on feet: Secondary | ICD-10-CM | POA: Diagnosis not present

## 2023-03-01 DIAGNOSIS — R42 Dizziness and giddiness: Secondary | ICD-10-CM

## 2023-03-01 DIAGNOSIS — R252 Cramp and spasm: Secondary | ICD-10-CM | POA: Insufficient documentation

## 2023-03-01 DIAGNOSIS — M545 Low back pain, unspecified: Secondary | ICD-10-CM | POA: Diagnosis not present

## 2023-03-01 DIAGNOSIS — G8929 Other chronic pain: Secondary | ICD-10-CM | POA: Diagnosis not present

## 2023-03-01 DIAGNOSIS — M542 Cervicalgia: Secondary | ICD-10-CM
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
## 2023-03-05 ENCOUNTER — Ambulatory Visit (INDEPENDENT_AMBULATORY_CARE_PROVIDER_SITE_OTHER): Payer: Medicare Other | Admitting: *Deleted

## 2023-03-05 ENCOUNTER — Telehealth: Payer: Self-pay | Admitting: Nurse Practitioner

## 2023-03-05 DIAGNOSIS — Z0000 Encounter for general adult medical examination without abnormal findings: Secondary | ICD-10-CM | POA: Diagnosis not present

## 2023-03-05 NOTE — Progress Notes (Signed)
Subjective:  Pt completed ADLs, Fall risk, and SDOH during e-check in on 02/26/23. Answers verified with pt.    [name] is a 77 y.o. female who presents for Medicare Annual (Subsequent) preventive examination.  I connected with  [name] on 03/05/23 by a audio enabled telemedicine application and verified that I am speaking with the correct person using two identifiers.  Patient Location: Home  Provider Location: Office/Clinic  I discussed the limitations of evaluation and management by telemedicine. The patient expressed understanding and agreed to proceed.   Review of Systems     Cardiac Risk Factors include: advanced age (>42mn, >>9women);dyslipidemia;hypertension     Objective:    There were no vitals filed for this visit. There is no height or weight on file to calculate BMI.     03/05/2023   10:24 AM 11/21/2022    6:23 PM 06/12/2022    9:20 AM 02/28/2022    9:46 AM 05/11/2020    9:22 AM 03/19/2020   11:56 AM 08/28/2019   11:44 AM  Advanced Directives  Does Patient Have a Medical Advance Directive? Yes Yes Yes Yes Yes Yes Yes  Type of AParamedicof AReddickLiving will HDouglasLiving will HBagnellLiving will HWillistonLiving will;Out of facility DNR (pink MOST or yellow form) HStartupLiving will HChurch HillLiving will HIsland CityLiving will  Does patient want to make changes to medical advance directive?  No - Patient declined No - Patient declined  No - Patient declined No - Patient declined   Copy of HSelmerin Chart? No - copy requested No - copy requested No - copy requested No - copy requested Yes - validated most recent copy scanned in chart (See row information) No - copy requested     Current Medications (verified) Outpatient Encounter Medications as of 03/05/2023  Medication Sig    acetaminophen (TYLENOL) 500 MG tablet Take 1,000 mg by mouth every 6 (six) hours as needed for mild pain or headache.    albuterol (VENTOLIN HFA) 108 (90 Base) MCG/ACT inhaler Inhale 2 puffs into the lungs every 4 (four) hours as needed.   amLODipine (NORVASC) 5 MG tablet Take 1 tablet (5 mg total) by mouth daily.   aspirin EC 81 MG tablet Take 1 tablet (81 mg total) by mouth daily.   atenolol (TENORMIN) 25 MG tablet Take 25 mg by mouth 2 (two) times daily.   Cyanocobalamin (VITAMIN B-12) 5000 MCG TBDP Take 1 tablet by mouth daily with breakfast.   denosumab (PROLIA) 60 MG/ML SOSY injection Inject 60 mg into the skin every 6 (six) months.   diclofenac Sodium (VOLTAREN) 1 % GEL Apply 2 g topically as needed.   dicyclomine (BENTYL) 10 MG capsule Take 1 capsule (10 mg total) by mouth 2 (two) times daily as needed for spasms.   Docusate Sodium (DSS) 100 MG CAPS Take 1 capsule by mouth as needed.   DULoxetine (CYMBALTA) 30 MG capsule Take 1 capsule (30 mg total) by mouth daily.   DULoxetine (CYMBALTA) 60 MG capsule Take 1 capsule by mouth twice daily   Evolocumab (REPATHA SURECLICK) [id number]/ML SOAJ Inject 140 mg into the skin every 14 (fourteen) days.   fluticasone (FLONASE) 50 MCG/ACT nasal spray Use 2 spray(s) in each nostril once daily   fluticasone (FLOVENT HFA) 110 MCG/ACT inhaler Inhale 2 puffs into the lungs 2 (two) times daily.  furosemide (LASIX) 20 MG tablet Take 1 tablet (20 mg total) by mouth as needed for edema.   gabapentin (NEURONTIN) 300 MG capsule Take 1-2 capsules (300-600 mg total) by mouth 3 (three) times daily.   hydroxychloroquine (PLAQUENIL) 200 MG tablet 1 tablet with food or milk   loratadine (CLARITIN) 10 MG tablet Take 1 tablet by mouth 2 (two) times daily.   mometasone (ELOCON) 0.1 % cream Apply 1 Application topically as needed.   Multiple Vitamins-Minerals (MULTIVITAMIN WITH MINERALS) tablet Take 1 tablet by mouth daily.   nitroGLYCERIN (NITROSTAT) 0.4 MG SL tablet Place  1 tablet (0.4 mg total) under the tongue every 5 (five) minutes as needed for chest pain. (Patient not taking: Reported on 02/14/2023)   nystatin cream (MYCOSTATIN) Apply 1 application topically 2 (two) times daily.   pantoprazole (PROTONIX) 40 MG tablet Take 1 tablet by mouth once daily (Patient taking differently: Take 40 mg by mouth 2 (two) times daily.)   pentosan polysulfate (ELMIRON) 100 MG capsule Take 1 capsule (100 mg total) by mouth 3 (three) times daily. Reported on 01/05/2016 (Patient taking differently: Take 100 mg by mouth as needed. Reported on 01/05/2016)   polyethylene glycol (MIRALAX) 17 g packet Take 17 g by mouth daily.   rOPINIRole (REQUIP) 0.25 MG tablet Take 0.25 mg by mouth as needed.   sucralfate (CARAFATE) 1 GM/10ML suspension Take 10 mLs (1 g total) by mouth 4 (four) times daily -  with meals and at bedtime.   traMADol (ULTRAM) 50 MG tablet Take 25 mg by mouth at bedtime.   traZODone (DESYREL) 100 MG tablet Take 1 tablet (100 mg total) by mouth at bedtime.   Vitamin D, Ergocalciferol, (DRISDOL) 1.25 MG (50000 UNIT) CAPS capsule Take 1 capsule (50,000 Units total) by mouth every 7 (seven) days. 12x Weeks   [DISCONTINUED] ALPRAZolam (XANAX) 0.5 MG tablet Take 0.5 mg by mouth as needed. (Patient not taking: Reported on 02/14/2023)   [DISCONTINUED] Estradiol 10 MCG TABS vaginal tablet Place 1 tablet vaginally as needed.   [DISCONTINUED] valACYclovir (VALTREX) 1000 MG tablet Take 2 tabs by mouth now and again  in 12 hours.   No facility-administered encounter medications on file as of 03/05/2023.    Allergies (verified) Clindamycin, Clindamycin hcl, Penicillins, Rosuvastatin, Baclofen, Lincomycin, Prednisone, Clindamycin hcl, Codeine, Doxycycline, Erythromycin, Erythromycin base, Fluzone [influenza virus vaccine], Haemophilus influenzae, Haemophilus influenzae vaccines, Latex, Levofloxacin, Other, Pentazocine, Pentazocine lactate, Pneumococcal vaccine, Pneumococcal vaccine  polyvalent, and Tamoxifen   History: Past Medical History:  Diagnosis Date   Allergy    Anemia    Anxiety    past hx    Arthritis    Asthma    Atrial flutter (HCC)    past history- not current   Breast cancer (HCC)    CAD (coronary artery disease) CARDIOLOGIST--  DR Angelena Form   mild non-obstructive cad   Cancer ([place])    right   Cataract    bilaterally removed    Chronic constipation    Chronic kidney disease    stage 3 ckd, interstitial cystitis   Concussion    x 3   COPD (chronic obstructive pulmonary disease) (HCC)    Depression    past hx    Family history of malignant hyperthermia    father had this   Fibromyalgia    Frequency of urination    GERD (gastroesophageal reflux disease)    H/O hiatal hernia    History of basal cell carcinoma excision    X2   History  of breast cancer ONCOLOGIST-- DR Jana Hakim---  NO RECURRANCE   DX 07/2012;  LOW GRADE DCIS  ER+PR+  ----  S/P RIGHT LUMPECTOMY WITH NEGATIVE MARGINS/   RADIATION ENDED 11/2012   History of chronic bronchitis    History of colonic polyps    History of tachycardia    CONTROLLED  WITH ATENOLOL   Hyperlipidemia    Hypertension    Neuromuscular disorder (HCC)    fibromyalgia   Neuropathy    Osteoporosis 01/2019   T score -2.2 stable/improved from prior study   Pelvic pain    Personal history of radiation therapy    S/P radiation therapy 11/12/12 - 12/05/12   right Breast   Sepsis ([place]) 2014   from UTI    Sinus headache    Sjogren's disease ([place])    Urgency of urination    Past Surgical History:  Procedure Laterality Date   31 HOUR [place] STUDY N/A 04/03/2016   Procedure: 24 HOUR PH STUDY;  Surgeon: Manus Gunning, MD;  Location: [facility];  Service: Gastroenterology;  Laterality: N/A;   BREAST BIOPSY Right 08/23/2012   ADH   BREAST BIOPSY Right 10/11/2012   Ductal Carcinoma   BREAST EXCISIONAL BIOPSY Right 09/04/2013   benign   BREAST LUMPECTOMY Right 10-11-2012   W/ SLN BX   CARDIAC  CATHETERIZATION  09-13-2007  DR Lia Foyer   WELL-PRESERVED LVF/  DIFFUSE SCATTERED CORONARY CALCIFACATION AND ATHEROSCLEROSIS WITHOUT OBSTRUCTION   CARDIAC CATHETERIZATION  08-04-2010  DR MCALHANY   NON-OBSTRUCTIVE CAD/  pLAD 40%/  oLAD 30%/  mLAD 30%/  pRCA 30%/  EF 60%   CARDIOVASCULAR STRESS TEST  06-18-2012  DR McALHANY   LOW RISK NUCLEAR STUDY/  SMALL FIXED AREA OF MODERATELY DECREASED UPTAKE IN ANTEROSEPTAL WALL WHICH MAY BE ARTIFACTUAL/  NO ISCHEMIA/  EF 68%   COLONOSCOPY  09-29-2010   CYSTOSCOPY     CYSTOSCOPY WITH HYDRODISTENSION AND BIOPSY N/A 03/06/2014   Procedure: CYSTOSCOPY/HYDRODISTENSION/ INSTILATION OF MARCAINE AND PYRIDIUM;  Surgeon: Ailene Rud, MD;  Location: [facility];  Service: Urology;  Laterality: N/A;   ESOPHAGEAL MANOMETRY N/A 04/03/2016   Procedure: ESOPHAGEAL MANOMETRY (EM);  Surgeon: Manus Gunning, MD;  Location: [facility];  Service: Gastroenterology;  Laterality: N/A;   EXTRACORPOREAL SHOCK WAVE LITHOTRIPSY Left 02/06/2019   Procedure: EXTRACORPOREAL SHOCK WAVE LITHOTRIPSY (ESWL);  Surgeon: Kathie Rhodes, MD;  Location: [facility];  Service: Urology;  Laterality: Left;   NASAL SINUS SURGERY  1985   ORIF RIGHT ANKLE  FX  2006   POLYPECTOMY     REMOVAL VOCAL CORD CYST  FEB 2014   RIGHT BREAST BX  08-23-2012   RIGHT HAND SURGERY  X3  LAST ONE 2009   INCLUDES  ORIF RIGHT 5TH FINGER AND REVISION TWICE   SKIN CANCER EXCISION     TONSILLECTOMY AND ADENOIDECTOMY  AGE 51   TOTAL ABDOMINAL HYSTERECTOMY W/ BILATERAL SALPINGOOPHORECTOMY  1982   W/  APPENDECTOMY   TRANSTHORACIC ECHOCARDIOGRAM  06-24-2012   GRADE I DIASTOLIC DYSFUNCTION/  EF 55-60%/  MILD MR   UPPER GASTROINTESTINAL ENDOSCOPY     Family History  Problem Relation Age of Onset   Rectal cancer Mother    Colon cancer Mother    Pancreatic cancer Mother    Diabetes Mother    Breast cancer Mother 75   Breast cancer Maternal Aunt        breast   Birth defects Maternal Aunt     Irritable bowel syndrome Son  Heart disease Son        CAD, MV replacement   Allergic Disorder Daughter    Diabetes Daughter    Colon cancer Father    Colon polyps Father    Diabetes Father    Stroke Father    Heart disease Father        CHF   Hyperlipidemia Father    Hypertension Father    Arthritis Father    Breast cancer Paternal Aunt    Breast cancer Paternal Aunt    Arthritis Paternal Uncle    Allergic Disorder Daughter    Heart disease Cousin        CAD, had a blood clot after stenting   Esophageal cancer Neg Hx    Stomach cancer Neg Hx    Social History   Socioeconomic History   Marital status: Married    Spouse name: Jori Moll    Number of children: 3   Years of education: BA   Highest education level: Not on file  Occupational History   Occupation: Retired Programmer, multimedia: RETIRED  Tobacco Use   Smoking status: Former    Packs/day: 1.00    Years: 15.00    Total pack years: 15.00    Types: Cigarettes    Quit date: 05/27/2005    Years since quitting: 17.7   Smokeless tobacco: Never  Vaping Use   Vaping Use: Never used  Substance and Sexual Activity   Alcohol use: No    Alcohol/week: 0.0 standard drinks of alcohol   Drug use: No   Sexual activity: Not Currently    Partners: Male    Birth control/protection: Surgical    Comment: 1st intercourse 77 yo-Fewer than 5 partners, hysterectomy  Other Topics Concern   Not on file  Social History Narrative   Lives with husband.   Caffeine use: 1/2 cup per day   Exercise-- 2days a week [employer],  Water aerobics, walking       Retired Marine scientist   No dietary restrictions, tries to maintain a heart healthy diet   Social Determinants of Health   Financial Resource Strain: Low Risk  (02/26/2023)   Overall Financial Resource Strain (CARDIA)    Difficulty of Paying Living Expenses: Not hard at all  Food Insecurity: No Food Insecurity (02/26/2023)   Hunger Vital Sign    Worried About Running Out of Food in the Last Year: Never  true    Ran Out of Food in the Last Year: Never true  Transportation Needs: No Transportation Needs (02/26/2023)   PRAPARE - Hydrologist (Medical): No    Lack of Transportation (Non-Medical): No  Physical Activity: Inactive (02/26/2023)   Exercise Vital Sign    Days of Exercise per Week: 0 days    Minutes of Exercise per Session: 10 min  Stress: No Stress Concern Present (02/26/2023)   [place]    Feeling of Stress : Only a little  Social Connections: Unknown (02/26/2023)   Social Connection and Isolation Panel [NHANES]    Frequency of Communication with Friends and Family: More than three times a week    Frequency of Social Gatherings with Friends and Family: Patient refused    Attends Religious Services: Not on Diplomatic Services operational officer of Clubs or Organizations: Yes    Attends Archivist Meetings: More than 4 times per year    Marital Status: Married    Tobacco Counseling  Counseling given: Not Answered   Clinical Intake:  Pre-visit preparation completed: Yes  Pain : No/denies pain  Diabetes: No  How often do you need to have someone help you when you read instructions, pamphlets, or other written materials from your doctor or pharmacy?: 2 - Rarely   Activities of Daily Living    02/26/2023    9:29 AM  In your present state of health, do you have any difficulty performing the following activities:  Hearing? 0  Vision? 0  Difficulty concentrating or making decisions? 1  Walking or climbing stairs? 1  Dressing or bathing? 0  Doing errands, shopping? 1  Comment husband Restaurant manager, fast food and eating ? Y  Using the Toilet? N  In the past six months, have you accidently leaked urine? N  Do you have problems with loss of bowel control? Y  Managing your Medications? N  Managing your Finances? N  Housekeeping or managing your Housekeeping? N    Patient Care  Team: Mosie Lukes, MD as PCP - General (Family Medicine) Stanford Breed Denice Bors, MD as PCP - Cardiology (Cardiology) Druscilla Brownie, MD as Consulting Physician (Dermatology) Collene Gobble, MD as Consulting Physician (Pulmonary Disease) Magrinat, Virgie Dad, MD (Inactive) as Consulting Physician (Oncology) Lelon Perla, MD as Consulting Physician (Cardiology) Armbruster, Carlota Raspberry, MD as Consulting Physician (Gastroenterology) Suella Broad, MD as Consulting Physician (Physical Medicine and Rehabilitation) Fontaine, Belinda Block, MD (Inactive) as Consulting Physician (Gynecology) Jerrell Belfast, MD as Consulting Physician (Otolaryngology) Paralee Cancel, MD as Consulting Physician (Orthopedic Surgery) Kathie Rhodes, MD (Inactive) as Consulting Physician (Urology) Valinda Party, MD (Rheumatology)  Indicate any recent Medical Services you may have received from other than [facility] providers in the past year (date may be approximate).     Assessment:   This is a routine wellness examination for [place].  Hearing/Vision screen No results found.  Dietary issues and exercise activities discussed: Current Exercise Habits: Home exercise routine, Type of exercise: Other - see comments (chair yoga), Time (Minutes): 20, Frequency (Times/Week): 3, Weekly Exercise (Minutes/Week): 60, Intensity: Mild, Exercise limited by: orthopedic condition(s)   Goals Addressed   None    Depression Screen    03/05/2023   10:28 AM 01/22/2023    8:21 AM 10/27/2022    9:49 AM 09/28/2022   11:27 AM 07/04/2022    3:20 PM 03/21/2022   11:41 AM 02/28/2022    9:47 AM  PHQ 2/9 Scores  PHQ - 2 Score 0 0 0 0 1 1 0  PHQ- 9 Score  0 0 '2 6 8     '$ Fall Risk    02/26/2023    9:29 AM 01/22/2023    8:20 AM 10/27/2022    9:48 AM 09/28/2022   11:26 AM 07/04/2022    3:20 PM  Fall Risk   Falls in the past year? 1 0 1  0  Number falls in past yr: 0 0 1 0 0  Injury with Fall? 0 0 1 1 0  Risk for fall due to : History of  fall(s)  History of fall(s);Impaired balance/gait;Impaired mobility  Impaired mobility  Follow up Falls evaluation completed Falls evaluation completed Falls prevention discussed;Falls evaluation completed Falls evaluation completed Falls evaluation completed    FALL RISK PREVENTION PERTAINING TO THE HOME:  Any stairs in or around the home? Yes  If so, are there any without handrails? No  Home free of loose throw rugs in walkways, pet beds, electrical cords, etc? Yes  Adequate  lighting in your home to reduce risk of falls? Yes   ASSISTIVE DEVICES UTILIZED TO PREVENT FALLS:  Life alert? No  Use of a cane, walker or w/c? Yes  Grab bars in the bathroom? Yes  Shower chair or bench in shower? Yes  Elevated toilet seat or a handicapped toilet? Yes   TIMED UP AND GO:  Was the test performed?  No, audio visit .   Cognitive Function:    07/26/2016   10:10 AM  MMSE - Mini Mental State Exam  Orientation to time 4  Orientation to Place 4  Registration 3  Attention/ Calculation 5  Recall 2  Language- name 2 objects 2  Language- repeat 1  Language- follow 3 step command 2  Language- read & follow direction 1  Write a sentence 1  Copy design 0  Total score 25        03/05/2023   10:41 AM 02/28/2022    9:51 AM  6CIT Screen  What Year? 0 points 0 points  What month? 0 points 0 points  What time? 0 points 0 points  Count back from 20 0 points 0 points  Months in reverse 0 points 0 points  Repeat phrase 0 points 0 points  Total Score 0 points 0 points    Immunizations Immunization History  Administered Date(s) Administered   Influenza Split 10/10/2011, 09/24/2012   Influenza Whole 10/18/2010   Influenza,inj,Quad PF,6+ Mos 10/13/2013, 09/25/2014   PFIZER(Purple Top)SARS-COV-2 Vaccination 01/29/2020, 02/23/2020, 09/18/2020, 07/25/2021   Pneumococcal Polysaccharide-23 01/27/2011   Tdap 10/21/2013, 06/01/2018   Zoster Recombinat (Shingrix) 07/14/2019, 10/16/2019    TDAP  status: Up to date  Flu Vaccine status: Declined, Education has been provided regarding the importance of this vaccine but patient still declined. Advised may receive this vaccine at local pharmacy or Health Dept. Aware to provide a copy of the vaccination record if obtained from local pharmacy or Health Dept. Verbalized acceptance and understanding.  Pneumococcal vaccine status: Declined,  Education has been provided regarding the importance of this vaccine but patient still declined. Advised may receive this vaccine at local pharmacy or Health Dept. Aware to provide a copy of the vaccination record if obtained from local pharmacy or Health Dept. Verbalized acceptance and understanding.   Covid-19 vaccine status: Information provided on how to obtain vaccines.   Qualifies for Shingles Vaccine? Yes   Zostavax completed No   Shingrix Completed?: Yes  Screening Tests Health Maintenance  Topic Date Due   Diabetic kidney evaluation - Urine ACR  07/10/2014   COVID-19 Vaccine (5 - 2023-24 season) 08/25/2022   Medicare Annual Wellness (AWV)  03/01/2023   INFLUENZA VACCINE  03/25/2023 (Originally 07/25/2022)   HEMOGLOBIN A1C  04/27/2023   MAMMOGRAM  08/26/2023   Diabetic kidney evaluation - eGFR measurement  02/15/2024   DTaP/Tdap/Td (3 - Td or Tdap) 06/01/2028   DEXA SCAN  Completed   Hepatitis C Screening  Completed   Zoster Vaccines- Shingrix  Completed   HPV VACCINES  Aged Out   Pneumonia Vaccine 62+ Years old  Discontinued   FOOT EXAM  Discontinued   OPHTHALMOLOGY EXAM  Discontinued   COLONOSCOPY (Pts 45-61yr Insurance coverage will need to be confirmed)  Discontinued    Health Maintenance  Health Maintenance Due  Topic Date Due   Diabetic kidney evaluation - Urine ACR  07/10/2014   COVID-19 Vaccine (5 - 2023-24 season) 08/25/2022   Medicare Annual Wellness (AWV)  03/01/2023    Colorectal cancer screening: Type of screening:  Colonoscopy. Completed 03/22/22. Repeat every N/a  years  Mammogram status: Completed 08/25/22. Repeat every year  Bone Density status: Completed 04/06/21. Results reflect: Bone density results: OSTEOPOROSIS. Repeat every 2 years.  Lung Cancer Screening: (Low Dose CT Chest recommended if Age 20-80 years, 30 pack-year currently smoking OR have quit w/in 15years.) does not qualify.   Additional Screening:  Hepatitis C Screening: does qualify; Completed 07/15/19  Vision Screening: Recommended annual ophthalmology exams for early detection of glaucoma and other disorders of the eye. Is the patient up to date with their annual eye exam?  Yes  Who is the provider or what is the name of the office in which the patient attends annual eye exams? Dr. Rudene Christians If pt is not established with a provider, would they like to be referred to a provider to establish care? No .   Dental Screening: Recommended annual dental exams for proper oral hygiene  Community Resource Referral / Chronic Care Management: CRR required this visit?  No   CCM required this visit?  No      Plan:     I have personally reviewed and noted the following in the patient's chart:   Medical and social history Use of alcohol, tobacco or illicit drugs  Current medications and supplements including opioid prescriptions. Patient is not currently taking opioid prescriptions. Functional ability and status Nutritional status Physical activity Advanced directives List of other physicians Hospitalizations, surgeries, and ER visits in previous 12 months Vitals Screenings to include cognitive, depression, and falls Referrals and appointments  In addition, I have reviewed and discussed with patient certain preventive protocols, quality metrics, and best practice recommendations. A written personalized care plan for preventive services as well as general preventive health recommendations were provided to patient.   Due to this being a telephonic visit, the after visit  summary with patients personalized plan was offered to patient via mail or my-chart. Patient would like to access on my-chart.Rebeca Alert, Dane Bloch, [place]   03/05/2023   Nurse Notes: None

## 2023-03-05 NOTE — Patient Instructions (Signed)
[name] , Thank you for taking time to come for your Medicare Wellness Visit. I appreciate your ongoing commitment to your health goals. Please review the following plan we discussed and let me know if I can assist you in the future.     This is a list of the screening recommended for you and due dates:  Health Maintenance  Topic Date Due   Yearly kidney health urinalysis for diabetes  07/10/2014   COVID-19 Vaccine (5 - 2023-24 season) 08/25/2022   Flu Shot  03/25/2023*   Hemoglobin A1C  04/27/2023   Mammogram  08/26/2023   Yearly kidney function blood test for diabetes  02/15/2024   Medicare Annual Wellness Visit  03/04/2024   DTaP/Tdap/Td vaccine (3 - Td or Tdap) 06/01/2028   DEXA scan (bone density measurement)  Completed   Hepatitis C Screening: USPSTF Recommendation to screen - Ages 64-79 yo.  Completed   Zoster (Shingles) Vaccine  Completed   HPV Vaccine  Aged Out   Pneumonia Vaccine  Discontinued   Complete foot exam   Discontinued   Eye exam for diabetics  Discontinued   Colon Cancer Screening  Discontinued  *Topic was postponed. The date shown is not the original due date.     Next appointment: Follow up in one year for your annual wellness visit.   Preventive Care 66 Years and Older, Female Preventive care refers to lifestyle choices and visits with your health care provider that can promote health and wellness. What does preventive care include? A yearly physical exam. This is also called an annual well check. Dental exams once or twice a year. Routine eye exams. Ask your health care provider how often you should have your eyes checked. Personal lifestyle choices, including: Daily care of your teeth and gums. Regular physical activity. Eating a healthy diet. Avoiding tobacco and drug use. Limiting alcohol use. Practicing safe sex. Taking low-dose aspirin every day. Taking vitamin and mineral supplements as recommended by your health care provider. What  happens during an annual well check? The services and screenings done by your health care provider during your annual well check will depend on your age, overall health, lifestyle risk factors, and family history of disease. Counseling  Your health care provider may ask you questions about your: Alcohol use. Tobacco use. Drug use. Emotional well-being. Home and relationship well-being. Sexual activity. Eating habits. History of falls. Memory and ability to understand (cognition). Work and work Statistician. Reproductive health. Screening  You may have the following tests or measurements: Height, weight, and BMI. Blood pressure. Lipid and cholesterol levels. These may be checked every 5 years, or more frequently if you are over 79 years old. Skin check. Lung cancer screening. You may have this screening every year starting at age 78 if you have a 30-pack-year history of smoking and currently smoke or have quit within the past 15 years. Fecal occult blood test (FOBT) of the stool. You may have this test every year starting at age 34. Flexible sigmoidoscopy or colonoscopy. You may have a sigmoidoscopy every 5 years or a colonoscopy every 10 years starting at age 55. Hepatitis C blood test. Hepatitis B blood test. Sexually transmitted disease (STD) testing. Diabetes screening. This is done by checking your blood sugar (glucose) after you have not eaten for a while (fasting). You may have this done every 1-3 years. Bone density scan. This is done to screen for osteoporosis. You may have this done starting at age 10. Mammogram. This may be  done every 1-2 years. Talk to your health care provider about how often you should have regular mammograms. Talk with your health care provider about your test results, treatment options, and if necessary, the need for more tests. Vaccines  Your health care provider may recommend certain vaccines, such as: Influenza vaccine. This is recommended every  year. Tetanus, diphtheria, and acellular pertussis (Tdap, Td) vaccine. You may need a Td booster every 10 years. Zoster vaccine. You may need this after age 48. Pneumococcal 13-valent conjugate (PCV13) vaccine. One dose is recommended after age 64. Pneumococcal polysaccharide (PPSV23) vaccine. One dose is recommended after age 83. Talk to your health care provider about which screenings and vaccines you need and how often you need them. This information is not intended to replace advice given to you by your health care provider. Make sure you discuss any questions you have with your health care provider. Document Released: 01/07/2016 Document Revised: 08/30/2016 Document Reviewed: 10/12/2015 Elsevier Interactive Patient Education  2017 [place] Prevention in the Home Falls can cause injuries. They can happen to people of all ages. There are many things you can do to make your home safe and to help prevent falls. What can I do on the outside of my home? Regularly fix the edges of walkways and driveways and fix any cracks. Remove anything that might make you trip as you walk through a door, such as a raised step or threshold. Trim any bushes or trees on the path to your home. Use bright outdoor lighting. Clear any walking paths of anything that might make someone trip, such as rocks or tools. Regularly check to see if handrails are loose or broken. Make sure that both sides of any steps have handrails. Any raised decks and porches should have guardrails on the edges. Have any leaves, snow, or ice cleared regularly. Use sand or salt on walking paths during winter. Clean up any spills in your garage right away. This includes oil or grease spills. What can I do in the bathroom? Use night lights. Install grab bars by the toilet and in the tub and shower. Do not use towel bars as grab bars. Use non-skid mats or decals in the tub or shower. If you need to sit down in the shower, use a  plastic, non-slip stool. Keep the floor dry. Clean up any water that spills on the floor as soon as it happens. Remove soap buildup in the tub or shower regularly. Attach bath mats securely with double-sided non-slip rug tape. Do not have throw rugs and other things on the floor that can make you trip. What can I do in the bedroom? Use night lights. Make sure that you have a light by your bed that is easy to reach. Do not use any sheets or blankets that are too big for your bed. They should not hang down onto the floor. Have a firm chair that has side arms. You can use this for support while you get dressed. Do not have throw rugs and other things on the floor that can make you trip. What can I do in the kitchen? Clean up any spills right away. Avoid walking on wet floors. Keep items that you use a lot in easy-to-reach places. If you need to reach something above you, use a strong step stool that has a grab bar. Keep electrical cords out of the way. Do not use floor polish or wax that makes floors slippery. If you must use wax,  use non-skid floor wax. Do not have throw rugs and other things on the floor that can make you trip. What can I do with my stairs? Do not leave any items on the stairs. Make sure that there are handrails on both sides of the stairs and use them. Fix handrails that are broken or loose. Make sure that handrails are as long as the stairways. Check any carpeting to make sure that it is firmly attached to the stairs. Fix any carpet that is loose or worn. Avoid having throw rugs at the top or bottom of the stairs. If you do have throw rugs, attach them to the floor with carpet tape. Make sure that you have a light switch at the top of the stairs and the bottom of the stairs. If you do not have them, ask someone to add them for you. What else can I do to help prevent falls? Wear shoes that: Do not have high heels. Have rubber bottoms. Are comfortable and fit you  well. Are closed at the toe. Do not wear sandals. If you use a stepladder: Make sure that it is fully opened. Do not climb a closed stepladder. Make sure that both sides of the stepladder are locked into place. Ask someone to hold it for you, if possible. Clearly mark and make sure that you can see: Any grab bars or handrails. First and last steps. Where the edge of each step is. Use tools that help you move around (mobility aids) if they are needed. These include: Canes. Walkers. Scooters. Crutches. Turn on the lights when you go into a dark area. Replace any light bulbs as soon as they burn out. Set up your furniture so you have a clear path. Avoid moving your furniture around. If any of your floors are uneven, fix them. If there are any pets around you, be aware of where they are. Review your medicines with your doctor. Some medicines can make you feel dizzy. This can increase your chance of falling. Ask your doctor what other things that you can do to help prevent falls. This information is not intended to replace advice given to you by your health care provider. Make sure you discuss any questions you have with your health care provider. Document Released: 10/07/2009 Document Revised: 05/18/2016 Document Reviewed: 01/15/2015 Elsevier Interactive Patient Education  2017 [employer].

## 2023-03-05 NOTE — Telephone Encounter (Signed)
PT is returning call to discuss CT results. Please advise.

## 2023-03-06 NOTE — Telephone Encounter (Signed)
Spoke with pt.  Documented in result notes. Pt verbalized understanding with all questions answered.   

## 2023-03-08 ENCOUNTER — Ambulatory Visit: Payer: Medicare Other | Admitting: Physical Therapy

## 2023-03-08 ENCOUNTER — Encounter: Payer: Self-pay | Admitting: Physical Therapy

## 2023-03-08 DIAGNOSIS — M545 Low back pain, unspecified: Secondary | ICD-10-CM | POA: Diagnosis not present

## 2023-03-08 DIAGNOSIS — M6281 Muscle weakness (generalized): Secondary | ICD-10-CM

## 2023-03-08 DIAGNOSIS — R2681 Unsteadiness on feet: Secondary | ICD-10-CM

## 2023-03-08 DIAGNOSIS — G8929 Other chronic pain: Secondary | ICD-10-CM | POA: Diagnosis not present

## 2023-03-08 DIAGNOSIS — R252 Cramp and spasm: Secondary | ICD-10-CM | POA: Diagnosis not present

## 2023-03-08 DIAGNOSIS — R42 Dizziness and giddiness: Secondary | ICD-10-CM

## 2023-03-08 DIAGNOSIS — M542 Cervicalgia: Secondary | ICD-10-CM | POA: Diagnosis not present
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
## 2023-03-12 NOTE — Telephone Encounter (Signed)
Can we talk to [name] about a place to fit her in at

## 2023-03-13 ENCOUNTER — Ambulatory Visit: Payer: Medicare Other | Admitting: Nurse Practitioner

## 2023-03-15 ENCOUNTER — Ambulatory Visit: Payer: Medicare Other | Admitting: Physical Therapy

## 2023-03-15 ENCOUNTER — Encounter: Payer: Self-pay | Admitting: Physical Therapy

## 2023-03-15 DIAGNOSIS — M6281 Muscle weakness (generalized): Secondary | ICD-10-CM

## 2023-03-15 DIAGNOSIS — R252 Cramp and spasm: Secondary | ICD-10-CM | POA: Diagnosis not present

## 2023-03-15 DIAGNOSIS — G8929 Other chronic pain: Secondary | ICD-10-CM

## 2023-03-15 DIAGNOSIS — M545 Low back pain, unspecified: Secondary | ICD-10-CM | POA: Diagnosis not present

## 2023-03-15 DIAGNOSIS — M542 Cervicalgia: Secondary | ICD-10-CM | POA: Diagnosis not present

## 2023-03-15 DIAGNOSIS — R2681 Unsteadiness on feet: Secondary | ICD-10-CM | POA: Diagnosis not present

## 2023-03-15 DIAGNOSIS — R42 Dizziness and giddiness: Secondary | ICD-10-CM
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
## 2023-03-16 ENCOUNTER — Telehealth: Payer: Self-pay | Admitting: *Deleted

## 2023-03-16 ENCOUNTER — Telehealth: Payer: Self-pay | Admitting: Family Medicine

## 2023-03-16 ENCOUNTER — Other Ambulatory Visit: Payer: Self-pay | Admitting: Urology

## 2023-03-16 ENCOUNTER — Telehealth: Payer: Self-pay | Admitting: Cardiology

## 2023-03-16 DIAGNOSIS — N201 Calculus of ureter: Secondary | ICD-10-CM | POA: Diagnosis not present

## 2023-03-16 DIAGNOSIS — N3020 Other chronic cystitis without hematuria: Secondary | ICD-10-CM | POA: Diagnosis not present

## 2023-03-16 NOTE — Telephone Encounter (Signed)
Left message with Janett Billow at [facility] that it is okay for pt to hold Aspirin until her procedure.

## 2023-03-16 NOTE — Telephone Encounter (Signed)
Okay to continue holding aspirin for procedure. Restart as soon as okayed by urology ===== 77 year old female, history of CAD, the problem has been silent for many years per chart review.  Okay to hold aspirin for needed procedure

## 2023-03-16 NOTE — Telephone Encounter (Signed)
   Pre-operative Risk Assessment    Patient Name: [name]  DOB: 01-24-1946 MRN: [id number]      Request for Surgical Clearance    Procedure:   CYSTOSCOPY LEFT RETROGRADE PYELOGRAM, LEFT URETEROSCOPY, HOLMIUM LASER LITHOTRIPSYM, AND LEFT URETERAL STENT PLACEMENT  Date of Surgery:  Clearance 03/21/23                                 Surgeon:  Dr. Lovena Neighbours Surgeon's Group or Practice Name:  [facility]  Phone number:  [phone]  Fax number:  [phone]    Type of Clearance Requested:   - Medical  - Pharmacy:  Hold Aspirin 5 days prior    Type of Anesthesia:  General    Additional requests/questions:   SHE STATED PT NEEDS TO START HOLDING ASPIRIN TODAY   Signed, Foye Clock   03/16/2023, 4:03 PM

## 2023-03-16 NOTE — Telephone Encounter (Signed)
   Name: [name]  DOB: 01-22-1946  MRN: [id number]  Primary Cardiologist: Kirk Ruths, MD  Chart reviewed as part of pre-operative protocol coverage. Because of Karene Schnittker Supinski's past medical history and time since last visit, she will require a follow-up telephone visit in order to better assess preoperative cardiovascular risk.  Pre-op covering staff: - Please schedule appointment and call patient to inform them. If patient already had an upcoming appointment within acceptable timeframe, please add "pre-op clearance" to the appointment notes so provider is aware. - Please contact requesting surgeon's office via preferred method (i.e, phone, fax) to inform them of need for appointment prior to surgery.  Should be fine to hold ASA x 5-7 days as long as no symptoms prior to procedure. Restart when medically safe to do so.   Elgie Collard, PA-C  03/16/2023, 4:18 PM

## 2023-03-16 NOTE — Telephone Encounter (Signed)
Pt has been scheduled for tele pre op 03/19/23 @ 11 am add on due to med hold and procedure date. Med rec and consent are done.     Patient Consent for Virtual Visit        [name] has provided verbal consent on 03/16/2023 for a virtual visit (video or telephone).   CONSENT FOR VIRTUAL VISIT FOR:  [name]  By participating in this virtual visit I agree to the following:  I hereby voluntarily request, consent and authorize [facility] and its employed or contracted physicians, physician assistants, nurse practitioners or other licensed health care professionals (the Practitioner), to provide me with telemedicine health care services (the "Services") as deemed necessary by the treating Practitioner. I acknowledge and consent to receive the Services by the Practitioner via telemedicine. I understand that the telemedicine visit will involve communicating with the Practitioner through live audiovisual communication technology and the disclosure of certain medical information by electronic transmission. I acknowledge that I have been given the opportunity to request an in-person assessment or other available alternative prior to the telemedicine visit and am voluntarily participating in the telemedicine visit.  I understand that I have the right to withhold or withdraw my consent to the use of telemedicine in the course of my care at any time, without affecting my right to future care or treatment, and that the Practitioner or I may terminate the telemedicine visit at any time. I understand that I have the right to inspect all information obtained and/or recorded in the course of the telemedicine visit and may receive copies of available information for a reasonable fee.  I understand that some of the potential risks of receiving the Services via telemedicine include:  Delay or interruption in medical evaluation due to technological equipment failure or disruption; Information  transmitted may not be sufficient (e.g. poor resolution of images) to allow for appropriate medical decision making by the Practitioner; and/or  In rare instances, security protocols could fail, causing a breach of personal health information.  Furthermore, I acknowledge that it is my responsibility to provide information about my medical history, conditions and care that is complete and accurate to the best of my ability. I acknowledge that Practitioner's advice, recommendations, and/or decision may be based on factors not within their control, such as incomplete or inaccurate data provided by me or distortions of diagnostic images or specimens that may result from electronic transmissions. I understand that the practice of medicine is not an exact science and that Practitioner makes no warranties or guarantees regarding treatment outcomes. I acknowledge that a copy of this consent can be made available to me via my patient portal ([facility]), or I can request a printed copy by calling the office of [facility].    I understand that my insurance will be billed for this visit.   I have read or had this consent read to me. I understand the contents of this consent, which adequately explains the benefits and risks of the Services being provided via telemedicine.  I have been provided ample opportunity to ask questions regarding this consent and the Services and have had my questions answered to my satisfaction. I give my informed consent for the services to be provided through the use of telemedicine in my medical care

## 2023-03-16 NOTE — Telephone Encounter (Signed)
[name] from [facility] called to find out if pt can withhold aspirin until her surgery next Wednesday. Pt has UPJ stones. Urgent request will be faxed to [place] but she can be reached at [phone] ext [phone]. Pt said she hasn't taken aspirin since Wednesday.

## 2023-03-16 NOTE — Telephone Encounter (Signed)
Pt has been scheduled for tele pre op 03/19/23 @ 11 am add on due to med hold and procedure date. Med rec and consent are done.

## 2023-03-19 ENCOUNTER — Encounter: Payer: Self-pay | Admitting: Nurse Practitioner

## 2023-03-19 ENCOUNTER — Ambulatory Visit: Payer: Medicare Other | Attending: Cardiology | Admitting: Nurse Practitioner

## 2023-03-19 ENCOUNTER — Other Ambulatory Visit: Payer: Self-pay

## 2023-03-19 DIAGNOSIS — Z01810 Encounter for preprocedural cardiovascular examination: Secondary | ICD-10-CM | POA: Diagnosis not present

## 2023-03-19 NOTE — Progress Notes (Signed)
Virtual Visit via Telephone Note   Because of Julianna Zengel Crotteau's co-morbid illnesses, she is at least at moderate risk for complications without adequate follow up.  This format is felt to be most appropriate for this patient at this time.  The patient did not have access to video technology/had technical difficulties with video requiring transitioning to audio format only (telephone).  All issues noted in this document were discussed and addressed.  No physical exam could be performed with this format.  Please refer to the patient's chart for her consent to telehealth for [facility].  Evaluation Performed:  Preoperative cardiovascular risk assessment _____________   Date:  03/19/2023   Patient ID:  KEYONI LINDENMEYER, DOB [birth year], MRN [id number] Patient Location:  Home Provider location:   Office  Primary Care Provider:  Mosie Lukes, MD Primary Cardiologist:  Kirk Ruths, MD  Chief Complaint / Patient Profile   77 y.o. y/o female with a h/o nonobstructive CAD by cath 2011, nuclear study 2013 showed no ischemia with ejection fraction of 68%, nuclear study March 2016 showed ejection fraction 67%, no ischemia, mildly reduced LVEF, G1 DD, mild MR on echo 10/2021, HLD, statin intolerance, HTN who is pending cystoscopy left retrograde pyelogram, left ureteroscopy, holmium laser lithotripsy and left ureteral stent placement and presents today for telephonic preoperative cardiovascular risk assessment.  History of Present Illness    [place] WENTZELL is a 77 y.o. female who presents via audio/video conferencing for a telehealth visit today.  Pt was last seen in cardiology clinic on 11/01/2022 by Dr. Stanford Breed.  At that time PEG SALMINEN was doing well.  The patient is now pending procedure as outlined above. Since her last visit, she denies chest pain, shortness of breath, lower extremity edema, fatigue, palpitations, melena, hematuria, hemoptysis, diaphoresis, presyncope, syncope,  orthopnea, and PND. She is limited by back pain but remains active with gentle standing exercises and chair exercises. Also participates in PT. She uses a cane for ambulation assistance. She is able to achieve > 4 METS activity without concerning cardiac symptoms. Has pain in her chest after eating late or eating food that does not agree with her that she feels is GERD.   Past Medical History    Past Medical History:  Diagnosis Date   Allergy    Anemia    Anxiety    past hx    Arthritis    Asthma    Atrial flutter ([place])    past history- not current   Breast cancer ([place])    CAD (coronary artery disease) CARDIOLOGIST--  DR Angelena Form   mild non-obstructive cad   Cancer ([place])    right   Cataract    bilaterally removed    Chronic constipation    Chronic kidney disease    stage 3 ckd, interstitial cystitis   Concussion    x 3   COPD (chronic obstructive pulmonary disease) (HCC)    Depression    past hx    Family history of malignant hyperthermia    father had this   Fibromyalgia    Frequency of urination    GERD (gastroesophageal reflux disease)    H/O hiatal hernia    History of basal cell carcinoma excision    X2   History of breast cancer ONCOLOGIST-- DR Jana Hakim---  NO RECURRANCE   DX 07/2012;  LOW GRADE DCIS  ER+PR+  ----  S/P RIGHT LUMPECTOMY WITH NEGATIVE MARGINS/   RADIATION ENDED 11/2012   History of  chronic bronchitis    History of colonic polyps    History of tachycardia    CONTROLLED  WITH ATENOLOL   Hyperlipidemia    Hypertension    Neuromuscular disorder (HCC)    fibromyalgia   Neuropathy    Osteoporosis 01/2019   T score -2.2 stable/improved from prior study   Pelvic pain    Personal history of radiation therapy    S/P radiation therapy 11/12/12 - 12/05/12   right Breast   Sepsis ([place]) 2014   from UTI    Sinus headache    Sjogren's disease ([place])    Urgency of urination    Past Surgical History:  Procedure Laterality Date   18 HOUR [place] STUDY N/A  04/03/2016   Procedure: 24 HOUR PH STUDY;  Surgeon: Manus Gunning, MD;  Location: [facility];  Service: Gastroenterology;  Laterality: N/A;   BREAST BIOPSY Right 08/23/2012   ADH   BREAST BIOPSY Right 10/11/2012   Ductal Carcinoma   BREAST EXCISIONAL BIOPSY Right 09/04/2013   benign   BREAST LUMPECTOMY Right 10-11-2012   W/ SLN BX   CARDIAC CATHETERIZATION  09-13-2007  DR Lia Foyer   WELL-PRESERVED LVF/  DIFFUSE SCATTERED CORONARY CALCIFACATION AND ATHEROSCLEROSIS WITHOUT OBSTRUCTION   CARDIAC CATHETERIZATION  08-04-2010  DR MCALHANY   NON-OBSTRUCTIVE CAD/  pLAD 40%/  oLAD 30%/  mLAD 30%/  pRCA 30%/  EF 60%   CARDIOVASCULAR STRESS TEST  06-18-2012  DR McALHANY   LOW RISK NUCLEAR STUDY/  SMALL FIXED AREA OF MODERATELY DECREASED UPTAKE IN ANTEROSEPTAL WALL WHICH MAY BE ARTIFACTUAL/  NO ISCHEMIA/  EF 68%   COLONOSCOPY  09-29-2010   CYSTOSCOPY     CYSTOSCOPY WITH HYDRODISTENSION AND BIOPSY N/A 03/06/2014   Procedure: CYSTOSCOPY/HYDRODISTENSION/ INSTILATION OF MARCAINE AND PYRIDIUM;  Surgeon: Ailene Rud, MD;  Location: [facility];  Service: Urology;  Laterality: N/A;   ESOPHAGEAL MANOMETRY N/A 04/03/2016   Procedure: ESOPHAGEAL MANOMETRY (EM);  Surgeon: Manus Gunning, MD;  Location: [facility];  Service: Gastroenterology;  Laterality: N/A;   EXTRACORPOREAL SHOCK WAVE LITHOTRIPSY Left 02/06/2019   Procedure: EXTRACORPOREAL SHOCK WAVE LITHOTRIPSY (ESWL);  Surgeon: Kathie Rhodes, MD;  Location: [facility];  Service: Urology;  Laterality: Left;   NASAL SINUS SURGERY  1985   ORIF RIGHT ANKLE  FX  2006   POLYPECTOMY     REMOVAL VOCAL CORD CYST  FEB 2014   RIGHT BREAST BX  08-23-2012   RIGHT HAND SURGERY  X3  LAST ONE 2009   INCLUDES  ORIF RIGHT 5TH FINGER AND REVISION TWICE   SKIN CANCER EXCISION     TONSILLECTOMY AND ADENOIDECTOMY  AGE 64   TOTAL ABDOMINAL HYSTERECTOMY W/ BILATERAL SALPINGOOPHORECTOMY  1982   W/  APPENDECTOMY   TRANSTHORACIC  ECHOCARDIOGRAM  06-24-2012   GRADE I DIASTOLIC DYSFUNCTION/  EF 55-60%/  MILD MR   UPPER GASTROINTESTINAL ENDOSCOPY      Allergies  Allergies  Allergen Reactions   Clindamycin Rash and Shortness Of Breath   Clindamycin Hcl Shortness Of Breath and Rash   Penicillins Anaphylaxis   Rosuvastatin Anaphylaxis   Baclofen Other (See Comments) and Rash   Lincomycin Other (See Comments)   Prednisone Rash    Other reaction(s): Rash, Hives - can tolerate if she takes with benadryl    Clindamycin Hcl     Other reaction(s): Shortness of Breath, Rash   Codeine Hives and Other (See Comments)    headache Other reaction(s): Headache, Nausea, headache   Doxycycline  Skin rash   Erythromycin Hives   Erythromycin Base Other (See Comments)    other   Fluzone [Influenza Virus Vaccine] Other (See Comments)    Local reaction at the site   Haemophilus Influenzae Other (See Comments)    Local reaction at the site Local reaction at the site   Haemophilus Influenzae Vaccines Other (See Comments)    Local reaction at site   Latex Hives   Levofloxacin     Other reaction(s): sick   Other Other (See Comments)    Local reaction at site   Pentazocine Other (See Comments)   Pentazocine Lactate Other (See Comments)    HALLUCINATION   Pneumococcal Vaccine Hives and Swelling    Other reaction(s): Hives, Shortness of Breath   Pneumococcal Vaccine Polyvalent Hives, Swelling and Other (See Comments)    REACTION: redness, swelling, and hives at injection site   Tamoxifen Nausea And Vomiting and Other (See Comments)    HEADACHE Other reaction(s): Headache, Nausea    Home Medications    Prior to Admission medications   Medication Sig Start Date End Date Taking? Authorizing Provider  acetaminophen (TYLENOL) 500 MG tablet Take 1,000 mg by mouth every 6 (six) hours as needed for mild pain or headache.     [provider]  albuterol (VENTOLIN HFA) 108 (90 Base) MCG/ACT inhaler Inhale 2 puffs into  the lungs every 4 (four) hours as needed. 08/18/21   Collene Gobble, MD  amLODipine (NORVASC) 5 MG tablet Take 1 tablet (5 mg total) by mouth daily. 11/01/22   Lelon Perla, MD  aspirin EC 81 MG tablet Take 1 tablet (81 mg total) by mouth daily. 05/28/14   Burnell Blanks, MD  atenolol (TENORMIN) 25 MG tablet Take 25 mg by mouth 2 (two) times daily. 12/15/17   [provider]  Cyanocobalamin (VITAMIN B-12) 5000 MCG TBDP Take 1 tablet by mouth daily with breakfast.    [provider]  denosumab (PROLIA) 60 MG/ML SOSY injection Inject 60 mg into the skin every 6 (six) months. Patient not taking: Reported on 03/16/2023    [provider]  diclofenac Sodium (VOLTAREN) 1 % GEL Apply 2 g topically as needed. 02/25/15   [provider]  dicyclomine (BENTYL) 10 MG capsule Take 1 capsule (10 mg total) by mouth 2 (two) times daily as needed for spasms. 02/14/23   Noralyn Pick, NP  Docusate Sodium (DSS) 100 MG CAPS Take 1 capsule by mouth as needed.    [provider]  DULoxetine (CYMBALTA) 30 MG capsule Take 1 capsule (30 mg total) by mouth daily. 08/15/22   Mosie Lukes, MD  DULoxetine (CYMBALTA) 60 MG capsule Take 1 capsule by mouth twice daily 02/13/23   Mosie Lukes, MD  Evolocumab (REPATHA SURECLICK) [id number] MG/ML SOAJ Inject 140 mg into the skin every 14 (fourteen) days. 11/08/22   Mosie Lukes, MD  fluticasone Asencion Islam) 50 MCG/ACT nasal spray Use 2 spray(s) in each nostril once daily 11/15/20   Mosie Lukes, MD  fluticasone (FLOVENT HFA) 110 MCG/ACT inhaler Inhale 2 puffs into the lungs 2 (two) times daily. 09/21/22   Parrett, Fonnie Mu, NP  furosemide (LASIX) 20 MG tablet Take 1 tablet (20 mg total) by mouth as needed for edema. 10/19/21   Lelon Perla, MD  gabapentin (NEURONTIN) 300 MG capsule Take 1-2 capsules (300-600 mg total) by mouth 3 (three) times daily. 09/28/22   Mosie Lukes, MD  hydroxychloroquine (PLAQUENIL) 200 MG  tablet 1 tablet with food or milk 12/06/21   [provider]  loratadine (CLARITIN) 10 MG tablet Take 1 tablet by mouth 2 (two) times daily.    [provider]  mometasone (ELOCON) 0.1 % cream Apply 1 Application topically as needed. Patient not taking: Reported on 03/16/2023 02/12/23   [provider]  Multiple Vitamins-Minerals (MULTIVITAMIN WITH MINERALS) tablet Take 1 tablet by mouth daily. 10/29/22   Debbrah Alar, NP  nitroGLYCERIN (NITROSTAT) 0.4 MG SL tablet Place 1 tablet (0.4 mg total) under the tongue every 5 (five) minutes as needed for chest pain. Patient not taking: Reported on 02/14/2023 04/11/22   Caleen Jobs B, NP  nystatin cream (MYCOSTATIN) Apply 1 application topically 2 (two) times daily. 05/17/21   Mosie Lukes, MD  pantoprazole (PROTONIX) 40 MG tablet Take 1 tablet by mouth once daily Patient taking differently: Take 40 mg by mouth 2 (two) times daily. 01/10/23   Mosie Lukes, MD  pentosan polysulfate (ELMIRON) 100 MG capsule Take 1 capsule (100 mg total) by mouth 3 (three) times daily. Reported on 01/05/2016 Patient taking differently: Take 100 mg by mouth as needed. Reported on 01/05/2016 02/01/17   Terrance Mass, MD  polyethylene glycol (MIRALAX) 17 g packet Take 17 g by mouth daily. 11/10/20   Armbruster, Carlota Raspberry, MD  rOPINIRole (REQUIP) 0.25 MG tablet Take 0.25 mg by mouth as needed.    [provider]  sucralfate (CARAFATE) 1 GM/10ML suspension Take 10 mLs (1 g total) by mouth 4 (four) times daily -  with meals and at bedtime. 08/21/22   Ann Held, DO  traMADol (ULTRAM) 50 MG tablet Take 25 mg by mouth 2 (two) times daily. 10/12/22   [provider]  traZODone (DESYREL) 100 MG tablet Take 1 tablet (100 mg total) by mouth at bedtime. 12/08/22   Mosie Lukes, MD  Vitamin D, Ergocalciferol, (DRISDOL) 1.25 MG (50000 UNIT) CAPS capsule Take 1 capsule (50,000 Units total) by mouth every 7 (seven) days. 12x Weeks  01/23/23   Mosie Lukes, MD    Physical Exam    Vital Signs:  REGINA TENER does not have vital signs available for review today.  Given telephonic nature of communication, physical exam is limited. AAOx3. NAD. Normal affect.  Speech and respirations are unlabored.  Accessory Clinical Findings    None  Assessment & Plan    1.  Preoperative Cardiovascular Risk Assessment: According to the Revised Cardiac Risk Index (RCRI), her Perioperative Risk of Major Cardiac Event is (%): 6.6. Her Functional Capacity in METs is: 5.19 according to the [facility] Activity Status Index (DASI). The patient is doing well from a cardiac perspective. Therefore, based on ACC/AHA guidelines, the patient would be at acceptable risk for the planned procedure without further cardiovascular testing.   The patient was advised that if she develops new symptoms prior to surgery to contact our office to arrange for a follow-up visit, and she verbalized understanding.  Per office protocol, he may hold aspirin for 7 days prior to procedure and should resume as soon as hemodynamically stable postoperatively.  A copy of this note will be routed to requesting surgeon.  Time:   Today, I have spent 10 minutes with the patient with telehealth technology discussing medical history, symptoms, and management plan.     Emmaline Life, NP-C  03/19/2023, 10:56 AM [address], [address] Office [phone] Fax [phone]

## 2023-03-19 NOTE — Patient Instructions (Signed)
DUE TO COVID-19 ONLY TWO VISITORS  (aged 78 and older)  ARE ALLOWED TO COME WITH YOU AND STAY IN THE WAITING ROOM ONLY DURING PRE OP AND PROCEDURE.   **NO VISITORS ARE ALLOWED IN THE SHORT STAY AREA OR RECOVERY ROOM!!**  IF YOU WILL BE ADMITTED INTO THE HOSPITAL YOU ARE ALLOWED ONLY FOUR SUPPORT PEOPLE DURING VISITATION HOURS ONLY (7 AM -8PM)   The support person(s) must pass our screening, gel in and out, and wear a mask at all times, including in the patient's room. Patients must also wear a mask when staff or their support person are in the room. Visitors GUEST BADGE MUST BE WORN VISIBLY  One adult visitor may remain with you overnight and MUST be in the room by 8 P.M.     Your procedure is scheduled on: 03/21/23   Report to [facility] Main Entrance    Report to admitting at : 10:00 AM   Call this number if you have problems the morning of surgery [phone]   Do not eat food :After Midnight.   After Midnight you may have the following liquids until : 9:00 AM DAY OF SURGERY  Water Black Coffee (sugar ok, NO MILK/CREAM OR CREAMERS)  Tea (sugar ok, NO MILK/CREAM OR CREAMERS) regular and decaf                             Plain Jell-O (NO RED)                                           Fruit ices (not with fruit pulp, NO RED)                                     Popsicles (NO RED)                                                                  Juice: apple, WHITE grape, WHITE cranberry Sports drinks like Gatorade (NO RED)               Oral Hygiene is also important to reduce your risk of infection.                                    Remember - BRUSH YOUR TEETH THE MORNING OF SURGERY WITH YOUR REGULAR TOOTHPASTE  DENTURES WILL BE REMOVED PRIOR TO SURGERY PLEASE DO NOT APPLY "Poly grip" OR ADHESIVES!!!   Do NOT smoke after Midnight   Take these medicines the morning of surgery with A SIP OF WATER: atenelol,amlodipine,gabapentin,duloxetine,pantoprazole,Carafate.Use  inhalers as usual.Tylenol,loratadine as needed.                              You may not have any metal on your body including hair pins, jewelry, and body piercing             Do not wear make-up, lotions, powders,  perfumes/cologne, or deodorant  Do not wear nail polish including gel and S&S, artificial/acrylic nails, or any other type of covering on natural nails including finger and toenails. If you have artificial nails, gel coating, etc. that needs to be removed by a nail salon please have this removed prior to surgery or surgery may need to be canceled/ delayed if the surgeon/ anesthesia feels like they are unable to be safely monitored.   Do not shave  48 hours prior to surgery.    Do not bring valuables to the hospital. [place].   Contacts, glasses, or bridgework may not be worn into surgery.   Bring small overnight bag day of surgery.   DO NOT [facility]. PHARMACY WILL DISPENSE MEDICATIONS LISTED ON YOUR MEDICATION LIST TO YOU DURING YOUR ADMISSION [facility]!    Patients discharged on the day of surgery will not be allowed to drive home.  Someone NEEDS to stay with you for the first 24 hours after anesthesia.   Special Instructions: Bring a copy of your healthcare power of attorney and living will documents         the day of surgery if you haven't scanned them before.              Please read over the following fact sheets you were given: IF YOU HAVE QUESTIONS ABOUT YOUR PRE-OP INSTRUCTIONS PLEASE CALL [phone]    [facility] - Preparing for Surgery Before surgery, you can play an important role.  Because skin is not sterile, your skin needs to be as free of germs as possible.  You can reduce the number of germs on your skin by washing with CHG (chlorahexidine gluconate) soap before surgery.  CHG is an antiseptic cleaner which kills germs and bonds with the skin to continue killing germs  even after washing. Please DO NOT use if you have an allergy to CHG or antibacterial soaps.  If your skin becomes reddened/irritated stop using the CHG and inform your nurse when you arrive at Short Stay. Do not shave (including legs and underarms) for at least 48 hours prior to the first CHG shower.  You may shave your face/neck. Please follow these instructions carefully:  1.  Shower with CHG Soap the night before surgery and the  morning of Surgery.  2.  If you choose to wash your hair, wash your hair first as usual with your  normal  shampoo.  3.  After you shampoo, rinse your hair and body thoroughly to remove the  shampoo.                           4.  Use CHG as you would any other liquid soap.  You can apply chg directly  to the skin and wash                       Gently with a scrungie or clean washcloth.  5.  Apply the CHG Soap to your body ONLY FROM THE NECK DOWN.   Do not use on face/ open                           Wound or open sores. Avoid contact with eyes, ears mouth and genitals (private parts).  Wash face,  Genitals (private parts) with your normal soap.             6.  Wash thoroughly, paying special attention to the area where your surgery  will be performed.  7.  Thoroughly rinse your body with warm water from the neck down.  8.  DO NOT shower/wash with your normal soap after using and rinsing off  the CHG Soap.                9.  Pat yourself dry with a clean towel.            10.  Wear clean pajamas.            11.  Place clean sheets on your bed the night of your first shower and do not  sleep with pets. Day of Surgery : Do not apply any lotions/deodorants the morning of surgery.  Please wear clean clothes to the hospital/surgery center.  FAILURE TO FOLLOW THESE INSTRUCTIONS MAY RESULT IN THE CANCELLATION OF YOUR SURGERY PATIENT SIGNATURE_________________________________  NURSE  SIGNATURE__________________________________  ________________________________________________________________________

## 2023-03-20 ENCOUNTER — Encounter (HOSPITAL_COMMUNITY)
Admission: RE | Admit: 2023-03-20 | Discharge: 2023-03-20 | Disposition: A | Discharge status: Home / Self Care | Payer: Medicare Other | Source: Ambulatory Visit | Attending: Urology | Admitting: Urology

## 2023-03-20 ENCOUNTER — Other Ambulatory Visit: Payer: Self-pay

## 2023-03-20 ENCOUNTER — Encounter (HOSPITAL_COMMUNITY): Payer: Self-pay

## 2023-03-20 VITALS — BP 127/69 | HR 63 | Temp 98.3°F | Ht 63.5 in | Wt 128.0 lb

## 2023-03-20 DIAGNOSIS — N183 Chronic kidney disease, stage 3 unspecified: Secondary | ICD-10-CM | POA: Diagnosis not present

## 2023-03-20 DIAGNOSIS — Z87891 Personal history of nicotine dependence: Secondary | ICD-10-CM | POA: Insufficient documentation

## 2023-03-20 DIAGNOSIS — I2510 Atherosclerotic heart disease of native coronary artery without angina pectoris: Secondary | ICD-10-CM | POA: Insufficient documentation

## 2023-03-20 DIAGNOSIS — I129 Hypertensive chronic kidney disease with stage 1 through stage 4 chronic kidney disease, or unspecified chronic kidney disease: Secondary | ICD-10-CM | POA: Insufficient documentation

## 2023-03-20 DIAGNOSIS — K219 Gastro-esophageal reflux disease without esophagitis: Secondary | ICD-10-CM | POA: Insufficient documentation

## 2023-03-20 DIAGNOSIS — Z01812 Encounter for preprocedural laboratory examination: Secondary | ICD-10-CM | POA: Diagnosis not present

## 2023-03-20 DIAGNOSIS — C50912 Malignant neoplasm of unspecified site of left female breast: Secondary | ICD-10-CM | POA: Insufficient documentation

## 2023-03-20 DIAGNOSIS — J449 Chronic obstructive pulmonary disease, unspecified: Secondary | ICD-10-CM | POA: Insufficient documentation

## 2023-03-20 DIAGNOSIS — I10 Essential (primary) hypertension: Secondary | ICD-10-CM

## 2023-03-20 HISTORY — DX: Pneumonia, unspecified organism: J18.9

## 2023-03-20 HISTORY — DX: Cardiac arrhythmia, unspecified: I49.9

## 2023-03-20 HISTORY — DX: Family history of other specified conditions: Z84.89

## 2023-03-20 HISTORY — DX: Dyspnea, unspecified: R06.00

## 2023-03-20 LAB — BASIC METABOLIC PANEL
Anion gap: 9 (ref 5–15)
BUN: 12 mg/dL (ref 8–23)
CO2: 28 mmol/L (ref 22–32)
Calcium: 8.9 mg/dL (ref 8.9–10.3)
Chloride: 101 mmol/L (ref 98–111)
Creatinine, Ser: 0.98 mg/dL (ref 0.44–1.00)
GFR, Estimated: 59 mL/min — ABNORMAL LOW (ref >60)
Glucose, Bld: 105 mg/dL — ABNORMAL HIGH (ref 70–99)
Potassium: 4.4 mmol/L (ref 3.5–5.1)
Sodium: 138 mmol/L (ref 135–145)

## 2023-03-20 LAB — CBC
HCT: 39.5 % (ref 36.0–46.0)
Hemoglobin: 12 g/dL (ref 12.0–15.0)
MCH: 27.5 pg (ref 26.0–34.0)
MCHC: 30.4 g/dL (ref 30.0–36.0)
MCV: 90.6 fL (ref 80.0–100.0)
Platelets: 335 10*3/uL (ref 150–400)
RBC: 4.36 MIL/uL (ref 3.87–5.11)
RDW: 16.3 % — ABNORMAL HIGH (ref 11.5–15.5)
WBC: 9.3 10*3/uL (ref 4.0–10.5)
nRBC: 0 % (ref 0.0–0.2)

## 2023-03-20 NOTE — Progress Notes (Signed)
For Short Stay: [place] appointment date:  Bowel Prep reminder:   For Anesthesia: PCP - Dr. [name] Cardiologist - Dr. Sherryll Burger. Clearance: 03/19/23: Christen Bame: NP Chest x-ray - 09/22/22 EKG - 11/01/22 Stress Test - 2013 ECHO -  Cardiac Cath - 08/04/10 Pacemaker/ICD device last checked: Pacemaker orders received: Device Rep notified:  Spinal Cord Stimulator:  Sleep Study - Yes CPAP - NO  Fasting Blood Sugar - N/A Checks Blood Sugar _____ times a day Date and result of last Hgb A1c-  Last dose of GLP1 agonist-  GLP1 instructions:   Last dose of SGLT-2 inhibitors-  SGLT-2 instructions:   Blood Thinner Instructions: Aspirin Instructions: On hold. Last Dose: 03/13/23  Activity level: Can go up a flight of stairs and activities of daily living without stopping and without chest pain and/or shortness of breath   Able to exercise without chest pain and/or shortness of breath  Anesthesia review: Hx: HTN,Aflutter,CAD,COPD,CKD III,OSA(NO CPAP)  Patient denies shortness of breath, fever, cough and chest pain at PAT appointment   Patient verbalized understanding of instructions that were given to them at the PAT appointment. Patient was also instructed that they will need to review over the PAT instructions again at home before surgery.

## 2023-03-20 NOTE — Progress Notes (Addendum)
Anesthesia Chart Review   Case: [id number] Date/Time: 03/21/23 1150   Procedure: CYSTOSCOPY LEFT RETROGRADE PYELOGRAM, LEFT URETEROSCOPY, HOLMIUM LASER LITHOTRIPSYM, AND LEFT URETERAL STENT PLACEMENT (Left) - 20 MINUTES   Anesthesia type: Choice   Pre-op diagnosis: LEFT URETEROPELVIC JUNCTION CACLCULI   Location: [facility] / [facility]   Surgeons: Ceasar Mons, MD       DISCUSSION:76 y.o. former smoker with ho HTN, GERD, CAD, COPD, CKD Stage III, breast cancer, left ureteropelvic junction calculi scheduled for above procedure 03/21/2023 with Dr. Harrell Gave Lovena Neighbours.   Pt seen by cardiology 03/19/2023. Per OV note, "Preoperative Cardiovascular Risk Assessment: According to the Revised Cardiac Risk Index (RCRI), her Perioperative Risk of Major Cardiac Event is (%): 6.6. Her Functional Capacity in METs is: 5.19 according to the Duke Activity Status Index (DASI). The patient is doing well from a cardiac perspective. Therefore, based on ACC/AHA guidelines, the patient would be at acceptable risk for the planned procedure without further cardiovascular testing.    The patient was advised that if she develops new symptoms prior to surgery to contact our office to arrange for a follow-up visit, and she verbalized understanding.   Per office protocol, he may hold aspirin for 7 days prior to procedure and should resume as soon as hemodynamically stable postoperatively."  Anticipate pt can proceed with planned procedure barring acute status change.   VS: BP 127/69   Pulse 63   Temp 36.8 C (Oral)   Ht 5' 3.5" (1.613 m)   Wt 58.1 kg   SpO2 98%   BMI 22.32 kg/m   PROVIDERS: Mosie Lukes, MD is PCP   Primary Cardiologist:  Kirk Ruths, MD  LABS: Labs reviewed: Acceptable for surgery. (all labs ordered are listed, but only abnormal results are displayed)  Labs Reviewed  CBC - Abnormal; Notable for the following components:      Result Value   RDW 16.3 (*)    All other  components within normal limits  BASIC METABOLIC PANEL - Abnormal; Notable for the following components:   Glucose, Bld 105 (*)    GFR, Estimated 59 (*)    All other components within normal limits     IMAGES:   EKG:   CV: Echo 11/11/2021  1. Left ventricular ejection fraction, by estimation, is 60 to 65%. Left  ventricular ejection fraction by 3D volume is 65 %. The left ventricle has  normal function. The left ventricle has no regional wall motion  abnormalities. Left ventricular diastolic   parameters are consistent with Grade I diastolic dysfunction (impaired  relaxation). The average left ventricular global longitudinal strain is  -20.4 %. The global longitudinal strain is normal.   2. Right ventricular systolic function is normal. The right ventricular  size is normal. There is normal pulmonary artery systolic pressure.   3. The mitral valve is grossly normal. Mild mitral valve regurgitation.   4. The aortic valve is tricuspid. Aortic valve regurgitation is not  visualized. Aortic valve sclerosis is present, with no evidence of aortic  valve stenosis.   5. The inferior vena cava is normal in size with greater than 50%  respiratory variability, suggesting right atrial pressure of 3 mmHg.   Comparison(s): 07/14/19 EF 60-65%.  Past Medical History:  Diagnosis Date   Allergy    Anemia    Anxiety    past hx    Arthritis    Asthma    Atrial flutter ([place])    past history- not current  Breast cancer ([place])    CAD (coronary artery disease) CARDIOLOGIST--  DR Angelena Form   mild non-obstructive cad   Cancer ([place])    right   Cataract    bilaterally removed    Chronic constipation    Chronic kidney disease    stage 3 ckd, interstitial cystitis   Concussion    x 3   COPD (chronic obstructive pulmonary disease) (HCC)    Depression    past hx    Dyspnea    Dysrhythmia    Family history of adverse reaction to anesthesia    Family history of malignant hyperthermia     father had this   Fibromyalgia    Frequency of urination    GERD (gastroesophageal reflux disease)    H/O hiatal hernia    History of basal cell carcinoma excision    X2   History of breast cancer ONCOLOGIST-- DR Jana Hakim---  NO RECURRANCE   DX 07/2012;  LOW GRADE DCIS  ER+PR+  ----  S/P RIGHT LUMPECTOMY WITH NEGATIVE MARGINS/   RADIATION ENDED 11/2012   History of chronic bronchitis    History of colonic polyps    History of tachycardia    CONTROLLED  WITH ATENOLOL   Hyperlipidemia    Hypertension    Neuromuscular disorder (HCC)    fibromyalgia   Neuropathy    Osteoporosis 01/2019   T score -2.2 stable/improved from prior study   Pelvic pain    Personal history of radiation therapy    Pneumonia    S/P radiation therapy 11/12/12 - 12/05/12   right Breast   Sepsis ([place]) 2014   from UTI    Sinus headache    Sjogren's disease ([place])    Urgency of urination     Past Surgical History:  Procedure Laterality Date   42 HOUR [place] STUDY N/A 04/03/2016   Procedure: 24 HOUR [place] STUDY;  Surgeon: Manus Gunning, MD;  Location: [facility];  Service: Gastroenterology;  Laterality: N/A;   BREAST BIOPSY Right 08/23/2012   ADH   BREAST BIOPSY Right 10/11/2012   Ductal Carcinoma   BREAST EXCISIONAL BIOPSY Right 09/04/2013   benign   BREAST LUMPECTOMY Right 10/11/2012   W/ SLN BX   CARDIAC CATHETERIZATION  09-13-2007  DR Lia Foyer   WELL-PRESERVED LVF/  DIFFUSE SCATTERED CORONARY CALCIFACATION AND ATHEROSCLEROSIS WITHOUT OBSTRUCTION   CARDIAC CATHETERIZATION  08-04-2010  DR MCALHANY   NON-OBSTRUCTIVE CAD/  pLAD 40%/  oLAD 30%/  mLAD 30%/  pRCA 30%/  EF 60%   CARDIOVASCULAR STRESS TEST  06-18-2012  DR McALHANY   LOW RISK NUCLEAR STUDY/  SMALL FIXED AREA OF MODERATELY DECREASED UPTAKE IN ANTEROSEPTAL WALL WHICH MAY BE ARTIFACTUAL/  NO ISCHEMIA/  EF 68%   COLONOSCOPY  09/29/2010   CYSTOSCOPY     CYSTOSCOPY WITH HYDRODISTENSION AND BIOPSY N/A 03/06/2014   Procedure:  CYSTOSCOPY/HYDRODISTENSION/ INSTILATION OF MARCAINE AND PYRIDIUM;  Surgeon: Ailene Rud, MD;  Location: [facility];  Service: Urology;  Laterality: N/A;   ESOPHAGEAL MANOMETRY N/A 04/03/2016   Procedure: ESOPHAGEAL MANOMETRY (EM);  Surgeon: Manus Gunning, MD;  Location[facility];  Service: Gastroenterology;  Laterality: N/A;   EXTRACORPOREAL SHOCK WAVE LITHOTRIPSY Left 02/06/2019   Procedure: EXTRACORPOREAL SHOCK WAVE LITHOTRIPSY (ESWL);  Surgeon: Kathie Rhodes, MD;  Location[facility];  Service: Urology;  Laterality: Left;   NASAL SINUS SURGERY  1985   ORIF RIGHT ANKLE  FX  2006   POLYPECTOMY     REMOVAL VOCAL CORD CYST  01/2013   RIGHT BREAST BX  08/23/2012   RIGHT HAND SURGERY  X3  LAST ONE 2009   INCLUDES  ORIF RIGHT 5TH FINGER AND REVISION TWICE   SKIN CANCER EXCISION     face,arms   TONSILLECTOMY AND ADENOIDECTOMY  AGE 20   TOTAL ABDOMINAL HYSTERECTOMY W/ BILATERAL SALPINGOOPHORECTOMY  1982   W/  APPENDECTOMY   TRANSTHORACIC ECHOCARDIOGRAM  06/24/2012   GRADE I DIASTOLIC DYSFUNCTION/  EF 55-60%/  MILD MR   UPPER GASTROINTESTINAL ENDOSCOPY      MEDICATIONS:  acetaminophen (TYLENOL) 500 MG tablet   albuterol (VENTOLIN HFA) 108 (90 Base) MCG/ACT inhaler   amLODipine (NORVASC) 5 MG tablet   aspirin EC 81 MG tablet   atenolol (TENORMIN) 25 MG tablet   CALCIUM PO   carboxymethylcellul-glycerin (OPTIVE) 0.5-0.9 % ophthalmic solution   Cholecalciferol (VITAMIN D3) 50 MCG (2000 UT) CAPS   cyanocobalamin (VITAMIN B12) 1000 MCG tablet   diclofenac Sodium (VOLTAREN) 1 % GEL   dicyclomine (BENTYL) 10 MG capsule   diphenhydrAMINE (BENADRYL) 25 mg capsule   Docusate Sodium (DSS) 100 MG CAPS   DULoxetine (CYMBALTA) 30 MG capsule   DULoxetine (CYMBALTA) 60 MG capsule   Evolocumab (REPATHA SURECLICK) [id number] MG/ML SOAJ   fluticasone (FLONASE) 50 MCG/ACT nasal spray   fluticasone (FLOVENT HFA) 110 MCG/ACT inhaler   furosemide (LASIX) 20 MG tablet    gabapentin (NEURONTIN) 300 MG capsule   hydroxychloroquine (PLAQUENIL) 200 MG tablet   loratadine (CLARITIN) 10 MG tablet   Multiple Vitamins-Minerals (MULTIVITAMIN WITH MINERALS) tablet   nitroGLYCERIN (NITROSTAT) 0.4 MG SL tablet   nystatin cream (MYCOSTATIN)   pantoprazole (PROTONIX) 40 MG tablet   pentosan polysulfate (ELMIRON) 100 MG capsule   polyethylene glycol (MIRALAX) 17 g packet   sucralfate (CARAFATE) 1 GM/10ML suspension   traMADol (ULTRAM) 50 MG tablet   traZODone (DESYREL) 100 MG tablet   Vitamin D, Ergocalciferol, (DRISDOL) 1.25 MG (50000 UNIT) CAPS capsule   No current facility-administered medications for this encounter.     Konrad Felix Ward, PA-C [facility] [phone]

## 2023-03-20 NOTE — Anesthesia Preprocedure Evaluation (Signed)
Anesthesia Evaluation  Patient identified by MRN, date of birth, ID band Patient awake    Reviewed: Allergy & Precautions, NPO status , Patient's Chart, lab work & pertinent test results  History of Anesthesia Complications (+) MALIGNANT HYPERTHERMIA, Family history of anesthesia reaction and history of anesthetic complications (Father MH, pt relates her father had life-threatening LOW temperature following heart surgery, unclear if was actually [place])  Airway Mallampati: I  TM Distance: >3 FB Neck ROM: Full    Dental  (+) Dental Advisory Given, Caps   Pulmonary asthma , sleep apnea , COPD, former smoker   breath sounds clear to auscultation       Cardiovascular hypertension, Pt. on medications and Pt. on home beta blockers + CAD (non-obstructive, mild)   Rhythm:Regular Rate:Normal  '22 ECHO: EF 60-65%, normal LVF with Grade 1 DD, normal RVF, mild MR   Neuro/Psych  Headaches  Anxiety Depression       GI/Hepatic Neg liver ROS,GERD  Medicated and Controlled,,  Endo/Other  Sjogren's  Renal/GU negative Renal ROS     Musculoskeletal  (+) Arthritis ,  Fibromyalgia -  Abdominal   Peds  Hematology negative hematology ROS (+)   Anesthesia Other Findings H/o breast cancer  Reproductive/Obstetrics                             Anesthesia Physical Anesthesia Plan  ASA: 3  Anesthesia Plan: General   Post-op Pain Management: Tylenol PO (pre-op)*   Induction:   PONV Risk Score and Plan: 3 and Ondansetron, Dexamethasone and TIVA  Airway Management Planned: LMA  Additional Equipment: None  Intra-op Plan:   Post-operative Plan:   Informed Consent: I have reviewed the patients History and Physical, chart, labs and discussed the procedure including the risks, benefits and alternatives for the proposed anesthesia with the patient or authorized representative who has indicated his/her understanding and  acceptance.     Dental advisory given  Plan Discussed with: CRNA and Surgeon  Anesthesia Plan Comments: (See PAT note 03/20/2023 Pt counseled regarding muscle biopsy/genetic testing for MH to define further)       Anesthesia Quick Evaluation

## 2023-03-21 ENCOUNTER — Encounter (HOSPITAL_COMMUNITY)
Admission: RE | Disposition: A | Discharge status: Home / Self Care | Payer: Self-pay | Source: Home / Self Care | Attending: Urology

## 2023-03-21 ENCOUNTER — Encounter (HOSPITAL_COMMUNITY): Payer: Self-pay | Admitting: Urology

## 2023-03-21 ENCOUNTER — Ambulatory Visit (HOSPITAL_COMMUNITY): Payer: Medicare Other

## 2023-03-21 ENCOUNTER — Ambulatory Visit (HOSPITAL_COMMUNITY): Payer: Medicare Other | Admitting: Physician Assistant

## 2023-03-21 ENCOUNTER — Ambulatory Visit (HOSPITAL_BASED_OUTPATIENT_CLINIC_OR_DEPARTMENT_OTHER): Payer: Medicare Other

## 2023-03-21 ENCOUNTER — Ambulatory Visit (HOSPITAL_COMMUNITY)
Admission: RE | Admit: 2023-03-21 | Discharge: 2023-03-21 | Disposition: A | Discharge status: Home / Self Care | Payer: Medicare Other | Attending: Urology | Admitting: Urology

## 2023-03-21 DIAGNOSIS — Z09 Encounter for follow-up examination after completed treatment for conditions other than malignant neoplasm: Secondary | ICD-10-CM | POA: Insufficient documentation

## 2023-03-21 DIAGNOSIS — Z87891 Personal history of nicotine dependence: Secondary | ICD-10-CM

## 2023-03-21 DIAGNOSIS — I10 Essential (primary) hypertension: Secondary | ICD-10-CM | POA: Diagnosis not present

## 2023-03-21 DIAGNOSIS — M797 Fibromyalgia: Secondary | ICD-10-CM | POA: Insufficient documentation

## 2023-03-21 DIAGNOSIS — I2510 Atherosclerotic heart disease of native coronary artery without angina pectoris: Secondary | ICD-10-CM

## 2023-03-21 DIAGNOSIS — K219 Gastro-esophageal reflux disease without esophagitis: Secondary | ICD-10-CM | POA: Insufficient documentation

## 2023-03-21 DIAGNOSIS — N200 Calculus of kidney: Secondary | ICD-10-CM | POA: Diagnosis not present

## 2023-03-21 DIAGNOSIS — Z79899 Other long term (current) drug therapy: Secondary | ICD-10-CM | POA: Insufficient documentation

## 2023-03-21 DIAGNOSIS — J449 Chronic obstructive pulmonary disease, unspecified: Secondary | ICD-10-CM

## 2023-03-21 DIAGNOSIS — N202 Calculus of kidney with calculus of ureter: Secondary | ICD-10-CM | POA: Insufficient documentation

## 2023-03-21 HISTORY — PX: CYSTOSCOPY/URETEROSCOPY/HOLMIUM LASER/STENT PLACEMENT: SHX6546

## 2023-03-21 SURGERY — CYSTOSCOPY/URETEROSCOPY/HOLMIUM LASER/STENT PLACEMENT
Anesthesia: General | Laterality: Left

## 2023-03-21 MED ORDER — KETOROLAC TROMETHAMINE 15 MG/ML IJ SOLN
INTRAMUSCULAR | Status: DC | PRN
  Administered 2023-03-21: 15 mg via INTRAVENOUS

## 2023-03-21 MED ORDER — FENTANYL CITRATE (PF) 100 MCG/2ML IJ SOLN
INTRAMUSCULAR | Status: AC
  Filled 2023-03-21: qty 2

## 2023-03-21 MED ORDER — KETOROLAC TROMETHAMINE 10 MG PO TABS: 10.0000 mg | ORAL_TABLET | Freq: Four times a day (QID) | ORAL | 0 refills | Status: DC | PRN

## 2023-03-21 MED ORDER — MIDAZOLAM HCL 2 MG/2ML IJ SOLN: 0.5000 mg | Freq: Once | INTRAMUSCULAR | Status: DC | PRN

## 2023-03-21 MED ORDER — ONDANSETRON HCL 4 MG/2ML IJ SOLN
INTRAMUSCULAR | Status: DC | PRN
  Administered 2023-03-21: 4 mg via INTRAVENOUS

## 2023-03-21 MED ORDER — OXYCODONE HCL 5 MG PO TABS: 5.0000 mg | ORAL_TABLET | Freq: Once | ORAL | Status: DC | PRN

## 2023-03-21 MED ORDER — LIDOCAINE 2% (20 MG/ML) 5 ML SYRINGE
INTRAMUSCULAR | Status: DC | PRN
  Administered 2023-03-21: 20 mg via INTRAVENOUS

## 2023-03-21 MED ORDER — PROMETHAZINE HCL 25 MG/ML IJ SOLN: 6.2500 mg | INTRAMUSCULAR | Status: DC | PRN

## 2023-03-21 MED ORDER — IOHEXOL 300 MG/ML  SOLN
INTRAMUSCULAR | Status: DC | PRN
  Administered 2023-03-21: 5 mL via URETHRAL

## 2023-03-21 MED ORDER — LACTATED RINGERS IV SOLN: INTRAVENOUS | Status: DC

## 2023-03-21 MED ORDER — ACETAMINOPHEN 500 MG PO TABS
ORAL_TABLET | ORAL | Status: AC
  Filled 2023-03-21: qty 2

## 2023-03-21 MED ORDER — MEPERIDINE HCL 50 MG/ML IJ SOLN: 6.2500 mg | INTRAMUSCULAR | Status: DC | PRN

## 2023-03-21 MED ORDER — GENTAMICIN SULFATE 40 MG/ML IJ SOLN
5.0000 mg/kg | INTRAVENOUS | Status: AC
  Administered 2023-03-21: 300 mg via INTRAVENOUS
  Filled 2023-03-21: qty 7.5

## 2023-03-21 MED ORDER — ONDANSETRON HCL 4 MG/2ML IJ SOLN
INTRAMUSCULAR | Status: AC
  Filled 2023-03-21: qty 2

## 2023-03-21 MED ORDER — ONDANSETRON HCL 4 MG PO TABS: 4.0000 mg | ORAL_TABLET | Freq: Every day | ORAL | 1 refills | Status: DC | PRN

## 2023-03-21 MED ORDER — PROPOFOL 1000 MG/100ML IV EMUL
INTRAVENOUS | Status: AC
  Filled 2023-03-21: qty 100

## 2023-03-21 MED ORDER — KETOROLAC TROMETHAMINE 30 MG/ML IJ SOLN
INTRAMUSCULAR | Status: AC
  Filled 2023-03-21: qty 1

## 2023-03-21 MED ORDER — ACETAMINOPHEN 500 MG PO TABS
1000.0000 mg | ORAL_TABLET | Freq: Once | ORAL | Status: AC
  Administered 2023-03-21: 1000 mg via ORAL

## 2023-03-21 MED ORDER — PROPOFOL 10 MG/ML IV BOLUS
INTRAVENOUS | Status: DC | PRN
  Administered 2023-03-21: 125 ug/kg/min via INTRAVENOUS
  Administered 2023-03-21: 100 mg via INTRAVENOUS

## 2023-03-21 MED ORDER — FENTANYL CITRATE PF 50 MCG/ML IJ SOSY: 25.0000 ug | PREFILLED_SYRINGE | INTRAMUSCULAR | Status: DC | PRN

## 2023-03-21 MED ORDER — FENTANYL CITRATE (PF) 100 MCG/2ML IJ SOLN
INTRAMUSCULAR | Status: DC | PRN
  Administered 2023-03-21: 50 ug via INTRAVENOUS

## 2023-03-21 MED ORDER — SODIUM CHLORIDE 0.9 % IR SOLN
Status: DC | PRN
  Administered 2023-03-21: 3000 mL via INTRAVESICAL

## 2023-03-21 MED ORDER — ORAL CARE MOUTH RINSE: 15.0000 mL | Freq: Once | OROMUCOSAL | Status: DC

## 2023-03-21 MED ORDER — PHENAZOPYRIDINE HCL 200 MG PO TABS: 200.0000 mg | ORAL_TABLET | Freq: Three times a day (TID) | ORAL | 0 refills | Status: DC | PRN

## 2023-03-21 MED ORDER — PROPOFOL 10 MG/ML IV BOLUS
INTRAVENOUS | Status: AC
  Filled 2023-03-21: qty 20

## 2023-03-21 MED ORDER — CHLORHEXIDINE GLUCONATE 0.12 % MT SOLN: 15.0000 mL | Freq: Once | OROMUCOSAL | Status: DC

## 2023-03-21 MED ORDER — OXYCODONE HCL 5 MG/5ML PO SOLN: 5.0000 mg | Freq: Once | ORAL | Status: DC | PRN

## 2023-03-21 SURGICAL SUPPLY — 22 items
BAG URO CATCHER STRL LF (MISCELLANEOUS) ×1 IMPLANT
BASKET ZERO TIP NITINOL 2.4FR (BASKET) IMPLANT
BSKT STON RTRVL ZERO TP 2.4FR (BASKET)
CATH URETL OPEN 5X70 (CATHETERS) ×1 IMPLANT
CLOTH BEACON ORANGE TIMEOUT ST (SAFETY) ×1 IMPLANT
EXTRACTOR STONE NITINOL NGAGE (UROLOGICAL SUPPLIES) IMPLANT
GLOVE SURG LX STRL 7.5 STRW (GLOVE) ×1 IMPLANT
GOWN SRG XL LVL 4 BRTHBL STRL (GOWNS) ×1 IMPLANT
GOWN STRL NON-REIN XL LVL4 (GOWNS) ×3
GUIDEWIRE STR DUAL SENSOR (WIRE) IMPLANT
GUIDEWIRE ZIPWRE .038 STRAIGHT (WIRE) ×1 IMPLANT
KIT TURNOVER KIT A (KITS) IMPLANT
LASER FIB FLEXIVA PULSE ID 365 (Laser) IMPLANT
MANIFOLD NEPTUNE II (INSTRUMENTS) ×1 IMPLANT
PACK CYSTO (CUSTOM PROCEDURE TRAY) ×1 IMPLANT
SHEATH NAVIGATOR HD 11/13X36 (SHEATH) IMPLANT
STENT URET 6FRX24 CONTOUR (STENTS) IMPLANT
STENT URET 6FRX26 CONTOUR (STENTS) IMPLANT
TRACTIP FLEXIVA PULS ID 200XHI (Laser) IMPLANT
TRACTIP FLEXIVA PULSE ID 200 (Laser) ×1
TUBING CONNECTING 10 (TUBING) ×1 IMPLANT
TUBING UROLOGY SET (TUBING) ×1 IMPLANT

## 2023-03-21 NOTE — Op Note (Signed)
Operative Note  Preoperative diagnosis:  1.  3 mm left UPJ stone  Postoperative diagnosis: 1.  3 mm left renal stone  Procedure(s): 1.  Cystoscopy with left ureteroscopy, holmium laser lithotripsy and left JJ stent placement 2.  Left retrograde pyelogram with intraoperative interpretation of fluoroscopic imaging  Surgeon: Ellison Hughs, MD  Assistants:  None  Anesthesia:  General  Complications:  None  EBL: Less than 5 mL  Specimens: 1.  None  Drains/Catheters: 1.  Left 6 French, 24 cm JJ stent without tether  Intraoperative findings:   Solitary left collecting system with no filling defects or dilation involving the left ureter or left renal pelvis seen on retrograde pyelogram Her left UPJ stone migrated into the renal pelvis following wire manipulation  Indication:  [name] is a 77 y.o. female with a 3 mm left UPJ stone associated with intermittent episodes of left-sided flank pain.  She is here today for definitive stone treatment.  She has been consented for the above procedures, voices understanding and wishes to proceed.  Description of procedure:  After informed consent was obtained, the patient was brought to the operating room and general LMA anesthesia was administered. The patient was then placed in the dorsolithotomy position and prepped and draped in the usual sterile fashion. A timeout was performed. A 23 French rigid cystoscope was then inserted into the urethral meatus and advanced into the bladder under direct vision. A complete bladder survey revealed no intravesical pathology.  A 5 French ureteral catheter was then inserted into the left ureteral orifice and a retrograde pyelogram was obtained, with the findings listed above.  A Glidewire was then used to intubate the lumen of the ureteral catheter and was advanced up to the left renal pelvis, under fluoroscopic guidance.  The catheter was then removed, leaving the wire in place.  A flexible  ureteroscope was then advanced alongside the wire and up to the proximal aspects of the left ureter where her stone was found to have migrated into the renal pelvis.  A 200 m holmium laser was then used to dust the stone into 2 mm or less fragments.  The flexible ureteroscope was then removed, identifying no luminal stone burden or evidence of ureteral trauma.  A 6 French, 24 cm JJ stent was then advanced over the wire and into good position within the left collecting system, confirming placement via fluoroscopy.  The patient's bladder was drained.  She tolerated the procedure well and was transferred to the postanesthesia in stable condition.  Plan: Follow-up in 1 week for office cystoscopy and stent removal

## 2023-03-21 NOTE — H&P (Signed)
PRE-OP H&P  Office Visit Report     03/16/2023   --------------------------------------------------------------------------------   [name]  MRN: [id number]  DOB: 08-01-1946, 77 year old Female  PRIMARY CARE:  [name], MD  PRIMARY CARE FAX:  [phone]  REFERRING:  Jan Fireman, MD  PROVIDER:  Ellison Hughs, M.D.  LOCATION:  [facility] 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   01/17/19: [name] has above noted history. She presents today with several complaint of gross hematuria, which has been occurring intermittently for Sheri past several weeks. However, she has noted progression of this over Sheri past few days. She is having hematuria today. She denies clots of difficulties voiding. She also complains of dysuria and increased urgency and frequency. She complains of bilateral flank and back pain, as well. She feels that Sheri pain is R > L. She does have some associated nausea today. She denies fevers or chills. She originally thought noted blood may be rectal, but she had complete GI evaluation on 12/31, which was normal. She has had a hysterectomy. She does use Tramadol every 12 hours for hx of fibromyalgia. She has been using some Uribel, which does help.   01/27/19: [name] returns today for follow up. CT imaging at prior OV revealed an approximately 4 X 6 mm calculus (approximately 653 HU with skin to stone distance 10 cm) noted at Sheri left renal pelvis. There was not a significant amount of hydronephrosis noted at Sheri time. Today she denies seeing a stone pass in Sheri interim. She continues to complain of ongoing flank and pelvic pain. Sheri flank pain is now occurring primarily on Sheri left. Sheri pelvic  pain will be bilaterally. She will have episodes of what she describes as a sharp, stabbing pain that will radiate to her LLQ. Pain has been well managed with Tramadol, which she uses daily for hx of Fibromyalgia. She denies interval episodes of hematuria. She does have some increased dysuria today, but denies exacerbation of voiding symptoms. She continues to have some nausea at times, but no vomiting. No fevers or chills.   02/25/19: She underwent left ESWL on 2/13 and returns today for follow up. She states that she has passed several fragments since her procedure, but she forgot to bring these with her today. Overall, she states that she is doing well since Sheri procedure. She denies any current flank or abdominal pain. She denies exacerbation of lower urinary tract symptoms. No dysuria, gross hematuria, fevers, chills, nausea, or vomiting. She did have some bruising following Sheri procedure, which has now resolved.   03/24/20: [name] is here today for a routine follow-up and KUB. She has been evaluated by Dr. Dema Severin for a colovesical fistula that is currently being observed. She reports sporadic episodes of foul odor and a scant amount of discharge coming from her vagina. Denies interval UTIs, dysuria or hematuria. She brought several stone fragments that she passed following her ESWL last year. Denies interval episodes of flank pain or stone passage. Not currently on taking Elmiron and Myrbetriq, but continues to use topical vaginal estrogen. Having regular BMs. Doing well.   11/29/2021: 77 year old woman who presents today with concerns of leg swelling, lower back pain, dribbling with urination, frequent small urination. She denies fevers and chills.  She recently went to see her cardiologist and her rheumatologist. She was placed on Lasix at this time but reports that her GFR function decreased down into Sheri 30s and her rheumatologist told her to follow-up here. She is not currently having any gross  hematuria and today reports that her legs are back down to a more normal for her size. She does have some lower back pain and discomfort but this is also chronic for her. She has not passed any stone fragments. She denies interval UTIs. She feels she is emptying appropriately.   03/16/2022: [name] is here today for routine follow-up. Above history noted. [name] was found to have a 1 mm bladder stone with no other upper tract abnormalities on CT from December 2022. He biggest complaints today is fecal incontinence along with gas and mucus-like discharge per vagina. She is also frustrated that her nephrologist is not addressing her chronic kidney disease to what she feels like is an appropriate level of care. From a urinary standpoint, she reports small-volume voids with some hesitancy and urgency, but feels like she is adequately emptying her bladder. PVR today is 0 mL. She denies interval UTIs, dysuria or hematuria.   06/01/2022: 77 year old female here today for concerns of suprapubic pain associated with back discomfort. She denies gross hematuria, fevers and chills. She is unsure if this is a urinary tract infection or her IC acting up.   08/01/22: 77 year old female who presents today for follow-up office visit regarding suprapubic pain and back discomfort. She currently uses Uribel as needed. She underwent a anesthetic bladder cocktail at last office visit and reports that it was very beneficial in controlling her pain and discomfort. She is interested in receiving an 1 today. She denies fevers and chills. She denies gross hematuria.   08/17/2022: This is a 77 year old female who presents today for an nurse visit for bladder instillation. Unfortunately, her urine is concerning for an infection and she is feeling poorly. She denies fevers and chills but does endorse suprapubic pain with back discomfort and dysuria.   09/14/2029: 77 year old female who presents today with concerns of suprapubic pain and  pressure. She also had a fall at home and landed on her coccyx. She has been getting intermittent bladder installations and reports this does help with her discomfort. She denies fevers and chills. She reports urinary frequency every 2-3 hours. Her pain is described as a stabbing pelvic pain and is relieved after she urinates. Sheri pain is worse prior to urination. She denies gross hematuria and incontinence. She does have some fecal incontinence she is in pelvic floor physical therapy for this. She does feel that pelvic floor physical therapy does benefit her with some of her pelvic discomfort.   10/04/22: [name] presents today for a follow up. She is seeing a great deal of benefit from Sheri vaginal valium and intermittent bladder instillations. She states she ia almost back to normal. She denies any fevers or chills. She is still having some pain to her lower back where she was injured after a fall. Doing well with PFPT.   03/16/23: [name] is here today for a routine follow-up. Above history noted. She presents today after being diagnosed with a 3 mm left UPJ stone (w/o hydro) on CT from 02/26/23 during an evaluation for RIGHT lower quadrant pain. She states that her pain is intermittent and dull in nature. She denies nausea/vomiting, fever/chills, dysuria or hematuria.     ALLERGIES: Clindamycin HCl CAPS - Hives Codeine  Derivatives - Itching Doxycycline Influenza (Split PF) - Hives, Itching Latex - Skin Rash Penicillins - Skin Rash, Trouble Breathing pentazocine - Other Reaction, hallucination Pneumococcal Vaccines - Swelling, Hives, Skin Rash PredniSONE (Pak) TABS - Trouble Breathing, Skin Rash, Hives tamoxifen - Headache, Nausea    MEDICATIONS: Atenolol 50 mg tablet Oral  Calcium Citrate- Vitamin D 315 mg calcium-6.25 mcg (250 unit) tablet Oral  Cymbalta 60 mg capsule,delayed release Oral  Fluticasone Propionate 50 mcg/actuation spray, suspension Nasal  Furosemide 20 mg tablet Oral   Gabapentin 300 mg capsule Oral  Methotrexate  Multiple Vitamins tablet Oral  Pantoprazole Sodium  Stool Softener 240 mg capsule Oral  Tramadol Hcl  Trazodone Hcl 100 mg tablet Oral  Uribel 118 MG Oral Capsule 1 capsule PO Q 8 H PRN     GU PSH: Cystoscopy - 2018, 2017 Cystoscopy Hydrodistention - 2015 ESWL, Left - 2020 Hysterectomy Unilat SO - 2015 Locm 300-399Mg /Ml Iodine,1Ml - 2019     NON-GU PSH: Bmi<30 And >=22 Calc & Docu - 2017 Doc Meds Verified W/Pt Or Re - 2017 Foot surgery (unspecified) Hand/finger Surgery Nose Surgery (Unspecified) Other Pt/Ot Current Status - 2017 Other Pt/Ot Goal Status - 2017 Pain Doc Pos And Plan - 2017 Remove Adenoids - 2015 Remove Breast Lesion - 2015 Remove Tonsils - 2015     GU PMH: Dorsalgia, Unspec - 10/04/2022, (Worsening, Chronic), F/U with Dr. Rolena Infante for back pain, - 2019 Pelvic/perineal pain - 10/04/2022, - 09/14/2022, - 06/01/2022, - 11/29/2021, - 2017 Interstitial Cystitis (w/o hematuria) - 08/31/2022, - 08/01/2022, - 2018, - 2018, - 2018 (Stable), - 2017, Chronic interstitial cystitis without hematuria, - 2015 Chronic cystitis (w/o hematuria) - 08/17/2022, - 08/01/2022, - 06/13/2022, - 06/01/2022, - 2021 Chronic kidney disease stage 3 (GFR 30-60) - 06/01/2022, - 03/16/2022 Flank Pain - 12/01/2021, - 2020 Hydronephrosis Unspec, Left - 11/29/2021, (Acute), Left, Will proceed with CT abd/pelvis w/wo contrast. BUN/creatnine today. , - 2019 Renal calculus - 11/29/2021, - 2020 Ureteral calculus, Left, 4 mm left UVJ - 2019 Incomplete bladder emptying (Stable, Chronic), PVR: 0 ml. Will continue with PRN Uribel. Will check kidney function today. 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 Send urine for cytology Will have pt RTC for cysto, - 2017 Suprapubic pain - 2017 Dysuria, Dysuria - 2015 Urinary Tract Inf, Unspec  site, Urinary tract infection - 2015 Other microscopic hematuria, Microhematuria - 2015      PMH Notes: Reports pelvic fracture and points to Sheri left pubic ramus On Sheri pelvic model in July 2017 and has used a cane ever since. Chronic kidney disease stage 3   NON-GU PMH: Fecal urgency - 03/16/2022 Pyuria/other UA findings - 03/16/2022 Constipation, unspecified - 2017, - 2017 Muscle weakness (generalized) - 2017, - 2017 Other muscle spasm - 2017, - 2017, Levator spasm, - 2015 Other lack of coordination - 2017 Unspecified dyspareunia, Dyspareunia - 2015 Encounter for general adult medical examination without abnormal findings, Encounter for preventive health examination - 2015 Anxiety, Anxiety - 2015 Breast Cancer, History, History of breast cancer - 2015 Obstructive sleep apnea (adult) (pediatric), Obstructive sleep apnea, adult - 2015 Personal history of other diseases of Sheri circulatory system, History of hypertension - 2015, History of cardiac arrhythmia, - 2015, History of cardiac murmur, - 2015 Personal history of other diseases of Sheri digestive system, History of gastroesophageal reflux (GERD) - 2015 Personal history  of other diseases of Sheri musculoskeletal system and connective tissue, History of arthritis - 2015 Personal history of other endocrine, nutritional and metabolic disease, History of hypercholesterolemia - 2015 Personal history of other mental and behavioral disorders, History of depression - 2015    FAMILY HISTORY: cardiac disorder - Runs In Family Heart Attack - Son Hematuria - Runs In Family Kidney Stones - Runs In Family pancreatic cancer - Runs In Family Strokes - Runs In Family   SOCIAL HISTORY: Marital Status: Married Preferred Language: English; Ethnicity: Not Hispanic Or Latino; Race: White Current Smoking Status: [name] does not smoke anymore.  Does not use smokeless tobacco. Had 1 drink per year. Does not use drugs. Does not drink caffeine. Has not  had a blood transfusion.    REVIEW OF SYSTEMS:    GU Review Female:   [name] denies frequent urination, hard to postpone urination, burning /pain with urination, get up at night to urinate, leakage of urine, stream starts and stops, trouble starting your stream, have to strain to urinate, and being pregnant.  Gastrointestinal (Upper):   [name] denies nausea, vomiting, and indigestion/ heartburn.  Gastrointestinal (Lower):   [name] denies diarrhea and constipation.  Constitutional:   [name] denies fever, night sweats, weight loss, and fatigue.  Skin:   [name] denies skin rash/ lesion and itching.  Eyes:   [name] denies blurred vision and double vision.  Ears/ Nose/ Throat:   [name] denies sore throat and sinus problems.  Hematologic/Lymphatic:   [name] denies swollen glands and easy bruising.  Cardiovascular:   [name] denies chest pains and leg swelling.  Respiratory:   [name] denies cough and shortness of breath.  Endocrine:   [name] denies excessive thirst.  Musculoskeletal:   [name] denies back pain and joint pain.  Neurological:   [name] denies headaches and dizziness.  Psychologic:   [name] denies depression and anxiety.   VITAL SIGNS:      03/16/2023 08:58 AM  Weight 126 lb / 57.15 kg  Height 65 in / 165.1 cm  BP 103/67 mmHg  Pulse 76 /min  Temperature 97.5 F / 36.3 C  BMI 21.0 kg/m   MULTI-SYSTEM PHYSICAL EXAMINATION:    Constitutional: Well-nourished. No physical deformities. Normally developed. Good grooming.  Neurologic / Psychiatric: Oriented to time, oriented to place, oriented to person. No depression, no anxiety, no agitation.     Complexity of Data:  Records Review:   Previous Hospital Records, Previous [name] Records  X-Ray Review: C.T. Abdomen/Pelvis: Reviewed Films. Reviewed Report. Discussed With [name].     PROCEDURES:          Visit Complexity - G2211          Urinalysis w/Scope Dipstick Dipstick Cont'd Micro  Color: Amber Bilirubin:  Neg mg/dL WBC/hpf: 0 - 5/hpf  Appearance: Slightly Cloudy Ketones: Trace mg/dL RBC/hpf: NS (Not Seen)  Specific Gravity: 1.025 Blood: Neg ery/uL Bacteria: Few (10-25/hpf)  pH: 6.0 Protein: 1+ mg/dL Cystals: NS (Not Seen)  Glucose: Neg mg/dL Urobilinogen: 2.0 mg/dL Casts: Hyaline    Nitrites: Neg Trichomonas: Not Present    Leukocyte Esterase: Trace leu/uL Mucous: Present      Epithelial Cells: 0 - 5/hpf      Yeast: NS (Not Seen)      Sperm: Not Present    Notes: QNS for spun micro    ASSESSMENT:      ICD-10 Details  1 GU:   Chronic cystitis (w/o hematuria) - N30.20 Chronic, Stable  2   Ureteral calculus - N20.1  Left, Acute, Uncomplicated  3   RLQ pain - [id number] Acute, Uncomplicated   PLAN:           Orders Labs Urine Culture          Schedule Return Visit/Planned Activity: Next Available Appointment - Schedule Surgery          Document Letter(s):  Created for [name]: Clinical Summary   Created for [name], MD         Notes:   Sheri risks, benefits and alternatives of cystoscopy with LEFT ureteroscopy, laser lithotripsy and ureteral stent placement was discussed [name]. Risks included, but are not limited to: bleeding, urinary tract infection, ureteral injury/avulsion, ureteral stricture formation, retained stone fragments, Sheri possibility that multiple surgeries may be required to treat Sheri stone(s), MI, stroke, PE and Sheri inherent risks of general anesthesia. [name] voices understanding and wishes to proceed.

## 2023-03-21 NOTE — Anesthesia Postprocedure Evaluation (Signed)
Anesthesia Post Note  Patient: [name]  Procedure(s) Performed: CYSTOSCOPY LEFT RETROGRADE PYELOGRAM, LEFT URETEROSCOPY, HOLMIUM LASER LITHOTRIPSYM, AND LEFT URETERAL STENT PLACEMENT (Left)     Patient location during evaluation: PACU Anesthesia Type: General Level of consciousness: awake and alert, patient cooperative and oriented Pain management: pain level controlled Vital Signs Assessment: post-procedure vital signs reviewed and stable Respiratory status: spontaneous breathing, nonlabored ventilation and respiratory function stable Cardiovascular status: blood pressure returned to baseline and stable Postop Assessment: no apparent nausea or vomiting and able to ambulate Anesthetic complications: no   No notable events documented.  Last Vitals:  Vitals:   03/21/23 1330 03/21/23 1343  BP: 119/67 122/66  Pulse: 75 78  Resp: 12 18  Temp: 36.6 C   SpO2: 98% 98%    Last Pain:  Vitals:   03/21/23 1343  TempSrc:   PainSc: 0-No pain                 Storm Sovine,E. Tomasz Steeves

## 2023-03-21 NOTE — Transfer of Care (Signed)
Immediate Anesthesia Transfer of Care Note  Patient: BIANCO DICENZO  Procedure(s) Performed: CYSTOSCOPY LEFT RETROGRADE PYELOGRAM, LEFT URETEROSCOPY, HOLMIUM LASER LITHOTRIPSYM, AND LEFT URETERAL STENT PLACEMENT (Left)  Patient Location: PACU  Anesthesia Type:General  Level of Consciousness: awake, alert , oriented, and patient cooperative  Airway & Oxygen Therapy: Patient Spontanous Breathing and Patient connected to face mask oxygen  Post-op Assessment: Report given to RN, Post -op Vital signs reviewed and stable, and Patient moving all extremities  Post vital signs: Reviewed and stable  Last Vitals:  Vitals Value Taken Time  BP 142/78 03/21/23 1302  Temp    Pulse 73 03/21/23 1305  Resp 12 03/21/23 1305  SpO2 100 % 03/21/23 1305  Vitals shown include unvalidated device data.  Last Pain:  Vitals:   03/21/23 1050  TempSrc: Oral  PainSc: 8       Patients Stated Pain Goal: 5 ([phone] [id number])  Complications: No notable events documented.

## 2023-03-21 NOTE — Progress Notes (Addendum)
Called spouse 3 times and left voicemail.  Surgery went routinely.

## 2023-03-21 NOTE — Anesthesia Procedure Notes (Signed)
Procedure Name: LMA Insertion Date/Time: 03/21/2023 12:18 PM  Performed by: Victoriano Lain, CRNAPre-anesthesia Checklist: Patient identified, Emergency Drugs available, Patient being monitored, Suction available and Timeout performed Patient Re-evaluated:Patient Re-evaluated prior to induction Oxygen Delivery Method: Circle system utilized Preoxygenation: Pre-oxygenation with 100% oxygen Induction Type: IV induction LMA: LMA with gastric port inserted LMA Size: 4.0 Number of attempts: 1 Placement Confirmation: positive ETCO2 and breath sounds checked- equal and bilateral Tube secured with: Tape Dental Injury: Teeth and Oropharynx as per pre-operative assessment

## 2023-03-22 ENCOUNTER — Encounter: Payer: Medicare Other | Admitting: Family

## 2023-03-22 ENCOUNTER — Encounter (HOSPITAL_COMMUNITY): Payer: Self-pay | Admitting: Urology

## 2023-03-27 ENCOUNTER — Encounter: Payer: Medicare Other | Admitting: Physical Therapy

## 2023-03-27 DIAGNOSIS — M47816 Spondylosis without myelopathy or radiculopathy, lumbar region: Secondary | ICD-10-CM | POA: Diagnosis not present

## 2023-03-29 DIAGNOSIS — N201 Calculus of ureter: Secondary | ICD-10-CM | POA: Diagnosis not present

## 2023-04-02 ENCOUNTER — Ambulatory Visit: Payer: Medicare Other | Admitting: Physical Therapy

## 2023-04-05 ENCOUNTER — Ambulatory Visit: Payer: Medicare Other | Attending: Family Medicine | Admitting: Physical Therapy

## 2023-04-05 ENCOUNTER — Encounter: Payer: Medicare Other | Admitting: Family Medicine

## 2023-04-09 ENCOUNTER — Ambulatory Visit (INDEPENDENT_AMBULATORY_CARE_PROVIDER_SITE_OTHER): Payer: Medicare Other | Admitting: Family

## 2023-04-09 ENCOUNTER — Telehealth: Payer: Self-pay | Admitting: Family

## 2023-04-09 ENCOUNTER — Ambulatory Visit (HOSPITAL_BASED_OUTPATIENT_CLINIC_OR_DEPARTMENT_OTHER)
Admission: RE | Admit: 2023-04-09 | Discharge: 2023-04-09 | Disposition: A | Discharge status: Home / Self Care | Payer: Medicare Other | Source: Ambulatory Visit | Attending: Family | Admitting: Family

## 2023-04-09 VITALS — BP 128/77 | HR 72 | Temp 98.0°F | Resp 16 | Wt 127.0 lb

## 2023-04-09 DIAGNOSIS — J440 Chronic obstructive pulmonary disease with acute lower respiratory infection: Secondary | ICD-10-CM

## 2023-04-09 DIAGNOSIS — R062 Wheezing: Secondary | ICD-10-CM | POA: Diagnosis not present

## 2023-04-09 DIAGNOSIS — J449 Chronic obstructive pulmonary disease, unspecified: Secondary | ICD-10-CM | POA: Diagnosis not present

## 2023-04-09 DIAGNOSIS — R059 Cough, unspecified: Secondary | ICD-10-CM | POA: Diagnosis not present

## 2023-04-09 DIAGNOSIS — J209 Acute bronchitis, unspecified: Secondary | ICD-10-CM

## 2023-04-09 LAB — COMPREHENSIVE METABOLIC PANEL
ALT: 9 U/L (ref 0–35)
AST: 19 U/L (ref 0–37)
Albumin: 3.8 g/dL (ref 3.5–5.2)
Alkaline Phosphatase: 104 U/L (ref 39–117)
BUN: 11 mg/dL (ref 6–23)
CO2: 28 mEq/L (ref 19–32)
Calcium: 8.9 mg/dL (ref 8.4–10.5)
Chloride: 102 mEq/L (ref 96–112)
Creatinine, Ser: 1.14 mg/dL (ref 0.40–1.20)
GFR: 46.35 mL/min — ABNORMAL LOW (ref >60.00)
Glucose, Bld: 107 mg/dL — ABNORMAL HIGH (ref 70–99)
Potassium: 3.9 mEq/L (ref 3.5–5.1)
Sodium: 140 mEq/L (ref 135–145)
Total Bilirubin: 0.5 mg/dL (ref 0.2–1.2)
Total Protein: 6.3 g/dL (ref 6.0–8.3)

## 2023-04-09 LAB — CBC WITH DIFFERENTIAL/PLATELET
Basophils Absolute: 0 10*3/uL (ref 0.0–0.1)
Basophils Relative: 0.7 % (ref 0.0–3.0)
Eosinophils Absolute: 0.1 10*3/uL (ref 0.0–0.7)
Eosinophils Relative: 1.7 % (ref 0.0–5.0)
HCT: 38 % (ref 36.0–46.0)
Hemoglobin: 12.2 g/dL (ref 12.0–15.0)
Lymphocytes Relative: 19.9 % (ref 12.0–46.0)
Lymphs Abs: 1.1 10*3/uL (ref 0.7–4.0)
MCHC: 32.1 g/dL (ref 30.0–36.0)
MCV: 87.4 fl (ref 78.0–100.0)
Monocytes Absolute: 0.5 10*3/uL (ref 0.1–1.0)
Monocytes Relative: 8.2 % (ref 3.0–12.0)
Neutro Abs: 4 10*3/uL (ref 1.4–7.7)
Neutrophils Relative %: 69.5 % (ref 43.0–77.0)
Platelets: 369 10*3/uL (ref 150.0–400.0)
RBC: 4.35 Mil/uL (ref 3.87–5.11)
RDW: 17.2 % — ABNORMAL HIGH (ref 11.5–15.5)
WBC: 5.8 10*3/uL (ref 4.0–10.5)

## 2023-04-09 MED ORDER — PANTOPRAZOLE SODIUM 40 MG PO TBEC: 40.0000 mg | DELAYED_RELEASE_TABLET | Freq: Every day | ORAL | 1 refills | Status: DC

## 2023-04-09 MED ORDER — DOXYCYCLINE HYCLATE 100 MG PO TABS: 100.0000 mg | ORAL_TABLET | Freq: Two times a day (BID) | ORAL | 0 refills | Status: DC

## 2023-04-09 MED ORDER — PREDNISONE 10 MG PO TABS: ORAL_TABLET | ORAL | 0 refills | Status: DC

## 2023-04-09 NOTE — Progress Notes (Addendum)
Subjective:   By signing my name below, I, Shehryar Baig, attest that this documentation has been prepared under the direction and in the presence of Sandford Craze, NP. 04/09/2023   Patient ID: [name], female    DOB: 10-26-1946, 77 y.o.   MRN: [id number]  Chief Complaint  Patient presents with   Sore Throat    Complains of sore throat for about 8 days   Cough    Complains of productive cough    Generalized Body Aches    Sore Throat  Associated symptoms include congestion and coughing.  Cough Associated symptoms include a fever (99-98.6 degree F), a sore throat and wheezing.   Patient is in today for a office visit.   Cough: She complains of cough, wheezing, sore throat, low grade fever(99-98.6), congestion and fatigue for the past week. She's been bedridden and finds it difficult to leave her bed due to fatigue. Her husband showed similar symptoms prior to her. She has not tested for Covid-19. She is keeping up her fluid intake. She has an albuterol inhaler and is willing to start using it regularly until her symptoms improve.   Protonix: She is requesting a refill for 40 mg Protonix.   Kidney stone: She reports having a procedure to have her kidney stone removed recently.   Back ablation: She is planning on scheduling her back ablation after her current symptoms improve.    Past Medical History:  Diagnosis Date   Allergy    Anemia    Anxiety    past hx    Arthritis    Asthma    Atrial flutter (HCC)    past history- not current   Breast cancer (HCC)    CAD (coronary artery disease) CARDIOLOGIST--  DR Clifton James   mild non-obstructive cad   Cancer (HCC)    right   Cataract    bilaterally removed    Chronic constipation    Chronic kidney disease    stage 3 ckd, interstitial cystitis   Concussion    x 3   COPD (chronic obstructive pulmonary disease) (HCC)    Depression    past hx    Dyspnea    Dysrhythmia    Family history of adverse reaction to  anesthesia    Family history of malignant hyperthermia    father had this   Fibromyalgia    Frequency of urination    GERD (gastroesophageal reflux disease)    H/O hiatal hernia    History of basal cell carcinoma excision    X2   History of breast cancer ONCOLOGIST-- DR Darnelle Catalan---  NO RECURRANCE   DX 07/2012;  LOW GRADE DCIS  ER+PR+  ----  S/P RIGHT LUMPECTOMY WITH NEGATIVE MARGINS/   RADIATION ENDED 11/2012   History of chronic bronchitis    History of colonic polyps    History of tachycardia    CONTROLLED  WITH ATENOLOL   Hyperlipidemia    Hypertension    Neuromuscular disorder (HCC)    fibromyalgia   Neuropathy    Osteoporosis 01/2019   T score -2.2 stable/improved from prior study   Pelvic pain    Personal history of radiation therapy    Pneumonia    S/P radiation therapy 11/12/12 - 12/05/12   right Breast   Sepsis (HCC) 2014   from UTI    Sinus headache    Sjogren's disease (HCC)    Urgency of urination     Past Surgical History:  Procedure Laterality Date  24 HOUR PH STUDY N/A 04/03/2016   Procedure: 24 HOUR PH STUDY;  Surgeon: Ruffin Frederick, MD;  Location: [facility];  Service: Gastroenterology;  Laterality: N/A;   BREAST BIOPSY Right 08/23/2012   ADH   BREAST BIOPSY Right 10/11/2012   Ductal Carcinoma   BREAST EXCISIONAL BIOPSY Right 09/04/2013   benign   BREAST LUMPECTOMY Right 10/11/2012   W/ [facility]N BX   CARDIAC CATHETERIZATION  09-13-2007  DR Riley Kill   WELL-PRESERVED LVF/  DIFFUSE SCATTERED CORONARY CALCIFACATION AND ATHEROSCLEROSIS WITHOUT OBSTRUCTION   CARDIAC CATHETERIZATION  08-04-2010  DR MCALHANY   NON-OBSTRUCTIVE CAD/  pLAD 40%/  oLAD 30%/  mLAD 30%/  pRCA 30%/  EF 60%   CARDIOVASCULAR STRESS TEST  06-18-2012  DR McALHANY   LOW RISK NUCLEAR STUDY/  SMALL FIXED AREA OF MODERATELY DECREASED UPTAKE IN ANTEROSEPTAL WALL WHICH MAY BE ARTIFACTUAL/  NO ISCHEMIA/  EF 68%   COLONOSCOPY  09/29/2010   CYSTOSCOPY     CYSTOSCOPY WITH HYDRODISTENSION  AND BIOPSY N/A 03/06/2014   Procedure: CYSTOSCOPY/HYDRODISTENSION/ INSTILATION OF MARCAINE AND PYRIDIUM;  Surgeon: Kathi Ludwig, MD;  Location: [facility];  Service: Urology;  Laterality: N/A;   CYSTOSCOPY/URETEROSCOPY/HOLMIUM LASER/STENT PLACEMENT Left 03/21/2023   Procedure: CYSTOSCOPY LEFT RETROGRADE PYELOGRAM, LEFT URETEROSCOPY, HOLMIUM LASER LITHOTRIPSYM, AND LEFT URETERAL STENT PLACEMENT;  Surgeon: Rene Paci, MD;  Location: [facility];  Service: Urology;  Laterality: Left;  60 MINUTES   ESOPHAGEAL MANOMETRY N/A 04/03/2016   Procedure: ESOPHAGEAL MANOMETRY (EM);  Surgeon: Ruffin Frederick, MD;  Location: [facility];  Service: Gastroenterology;  Laterality: N/A;   EXTRACORPOREAL SHOCK WAVE LITHOTRIPSY Left 02/06/2019   Procedure: EXTRACORPOREAL SHOCK WAVE LITHOTRIPSY (ESWL);  Surgeon: Ihor Gully, MD;  Location: [facility];  Service: Urology;  Laterality: Left;   NASAL SINUS SURGERY  1985   ORIF RIGHT ANKLE  FX  2006   POLYPECTOMY     REMOVAL VOCAL CORD CYST  01/2013   RIGHT BREAST BX  08/23/2012   RIGHT HAND SURGERY  X3  LAST ONE 2009   INCLUDES  ORIF RIGHT 5TH FINGER AND REVISION TWICE   SKIN CANCER EXCISION     face,arms   TONSILLECTOMY AND ADENOIDECTOMY  AGE 84   TOTAL ABDOMINAL HYSTERECTOMY W/ BILATERAL SALPINGOOPHORECTOMY  1982   W/  APPENDECTOMY   TRANSTHORACIC ECHOCARDIOGRAM  06/24/2012   GRADE I DIASTOLIC DYSFUNCTION/  EF 55-60%/  MILD MR   UPPER GASTROINTESTINAL ENDOSCOPY      Family History  Problem Relation Age of Onset   Rectal cancer Mother    Colon cancer Mother    Pancreatic cancer Mother    Diabetes Mother    Breast cancer Mother 31   Breast cancer Maternal Aunt        breast   Birth defects Maternal Aunt    Irritable bowel syndrome Son    Heart disease Son        CAD, MV replacement   Allergic Disorder Daughter    Diabetes Daughter    Colon cancer Father    Colon polyps Father    Diabetes Father    Stroke Father     Heart disease Father        CHF   Hyperlipidemia Father    Hypertension Father    Arthritis Father    Breast cancer Paternal Aunt    Breast cancer Paternal Aunt    Arthritis Paternal Uncle    Allergic Disorder Daughter    Heart disease Cousin  CAD, had a blood clot after stenting   Esophageal cancer Neg Hx    Stomach cancer Neg Hx     Social History   Socioeconomic History   Marital status: Married    Spouse name: Windy Fast    Number of children: 3   Years of education: BA   Highest education level: Not on file  Occupational History   Occupation: Retired Teacher, adult education: RETIRED  Tobacco Use   Smoking status: Former    Packs/day: 1.00    Years: 15.00    Additional pack years: 0.00    Total pack years: 15.00    Types: Cigarettes    Quit date: 05/27/2005    Years since quitting: 17.8   Smokeless tobacco: Never  Vaping Use   Vaping Use: Never used  Substance and Sexual Activity   Alcohol use: No    Alcohol/week: 0.0 standard drinks of alcohol   Drug use: No   Sexual activity: Not Currently    Partners: Male    Birth control/protection: Surgical    Comment: 1st intercourse 77 yo-Fewer than 5 partners, hysterectomy  Other Topics Concern   Not on file  Social History Narrative   Lives with husband.   Caffeine use: 1/2 cup per day   Exercise-- 2days a week [employer],  Water aerobics, walking       Retired Engineer, civil (consulting)   No dietary restrictions, tries to maintain a heart healthy diet   Social Determinants of Health   Financial Resource Strain: Low Risk  (02/26/2023)   Overall Financial Resource Strain (CARDIA)    Difficulty of Paying Living Expenses: Not hard at all  Food Insecurity: No Food Insecurity (02/26/2023)   Hunger Vital Sign    Worried About Running Out of Food in the Last Year: Never true    Ran Out of Food in the Last Year: Never true  Transportation Needs: No Transportation Needs (02/26/2023)   PRAPARE - Administrator, Civil Service (Medical): No     Lack of Transportation (Non-Medical): No  Physical Activity: Inactive (02/26/2023)   Exercise Vital Sign    Days of Exercise per Week: 0 days    Minutes of Exercise per Session: 10 min  Stress: No Stress Concern Present (02/26/2023)   [employer] of Occupational Health - Occupational Stress Questionnaire    Feeling of Stress : Only a little  Social Connections: Unknown (02/26/2023)   Social Connection and Isolation Panel [NHANES]    Frequency of Communication with Friends and Family: More than three times a week    Frequency of Social Gatherings with Friends and Family: Patient declined    Attends Religious Services: Not on Insurance claims handler of Clubs or Organizations: Yes    Attends Banker Meetings: More than 4 times per year    Marital Status: Married  Catering manager Violence: Not At Risk (03/05/2023)   Humiliation, Afraid, Rape, and Kick questionnaire    Fear of Current or Ex-Partner: No    Emotionally Abused: No    Physically Abused: No    Sexually Abused: No    Outpatient Medications Prior to Visit  Medication Sig Dispense Refill   acetaminophen (TYLENOL) 500 MG tablet Take 1,000 mg by mouth every 6 (six) hours as needed for mild pain or headache.      albuterol (VENTOLIN HFA) 108 (90 Base) MCG/ACT inhaler Inhale 2 puffs into the lungs every 4 (four) hours as needed. 1 each 0  amLODipine (NORVASC) 5 MG tablet Take 1 tablet (5 mg total) by mouth daily. 90 tablet 3   aspirin EC 81 MG tablet Take 1 tablet (81 mg total) by mouth daily. 90 tablet 3   atenolol (TENORMIN) 25 MG tablet Take 50 mg by mouth 2 (two) times daily.     CALCIUM PO Take 500 mg by mouth daily.     carboxymethylcellul-glycerin (OPTIVE) 0.5-0.9 % ophthalmic solution Place 1 drop into both eyes daily as needed for dry eyes.     Cholecalciferol (VITAMIN D3) 50 MCG (2000 UT) CAPS Take 2,000 Units by mouth daily.     cyanocobalamin (VITAMIN B12) 1000 MCG tablet Take 1,000 mg by mouth daily with  breakfast.     diclofenac Sodium (VOLTAREN) 1 % GEL Apply 2 g topically daily as needed (Back pain).     dicyclomine (BENTYL) 10 MG capsule Take 1 capsule (10 mg total) by mouth 2 (two) times daily as needed for spasms. (Patient taking differently: Take 10 mg by mouth 3 (three) times a week.) 30 capsule 0   diphenhydrAMINE (BENADRYL) 25 mg capsule Take 25 mg by mouth every 6 (six) hours as needed for itching.     Docusate Sodium (DSS) 100 MG CAPS Take 100 mg by mouth daily as needed (Constipation).     DULoxetine (CYMBALTA) 30 MG capsule Take 1 capsule (30 mg total) by mouth daily. 30 capsule 1   DULoxetine (CYMBALTA) 60 MG capsule Take 1 capsule by mouth twice daily 180 capsule 0   Evolocumab (REPATHA SURECLICK) 140 MG/ML SOAJ Inject 140 mg into the skin every 14 (fourteen) days. 6 mL 1   fluticasone (FLONASE) 50 MCG/ACT nasal spray Use 2 spray(s) in each nostril once daily (Patient taking differently: Place 2 sprays into both nostrils daily as needed for allergies or rhinitis.) 16 g 5   fluticasone (FLOVENT HFA) 110 MCG/ACT inhaler Inhale 2 puffs into the lungs 2 (two) times daily. (Patient taking differently: Inhale 2 puffs into the lungs daily as needed (Short of breath).) 1 each 5   furosemide (LASIX) 20 MG tablet Take 1 tablet (20 mg total) by mouth as needed for edema. 90 tablet 3   gabapentin (NEURONTIN) 300 MG capsule Take 300-900 mg by mouth See admin instructions. Take 600 mg in the morning 300 mg at lunch time and 900 mg at bedtime     hydroxychloroquine (PLAQUENIL) 200 MG tablet Take 200 mg by mouth daily.     loratadine (CLARITIN) 10 MG tablet Take 10 mg by mouth daily as needed for rhinitis.     Multiple Vitamins-Minerals (MULTIVITAMIN WITH MINERALS) tablet Take 1 tablet by mouth daily. (Patient taking differently: Take 1 tablet by mouth daily. 50+)     nitroGLYCERIN (NITROSTAT) 0.4 MG SL tablet Place 1 tablet (0.4 mg total) under the tongue every 5 (five) minutes as needed for chest  pain. 25 tablet 1   nystatin cream (MYCOSTATIN) Apply 1 application topically 2 (two) times daily. (Patient taking differently: Apply 1 application  topically daily as needed (vaginal yeast).) 30 g 2   ondansetron (ZOFRAN) 4 MG tablet Take 1 tablet (4 mg total) by mouth daily as needed for nausea or vomiting. 30 tablet 1   pentosan polysulfate (ELMIRON) 100 MG capsule Take 1 capsule (100 mg total) by mouth 3 (three) times daily. Reported on 01/05/2016 (Patient taking differently: Take 100 mg by mouth daily as needed (burning and pain). Reported on 01/05/2016) 90 capsule 11   phenazopyridine (PYRIDIUM) 200 MG  tablet Take 1 tablet (200 mg total) by mouth 3 (three) times daily as needed (for pain with urination). 30 tablet 0   polyethylene glycol (MIRALAX) 17 g packet Take 17 g by mouth daily. (Patient taking differently: Take 17 g by mouth daily as needed for mild constipation or moderate constipation.) 14 each 0   sucralfate (CARAFATE) 1 GM/10ML suspension Take 10 mLs (1 g total) by mouth 4 (four) times daily -  with meals and at bedtime. (Patient taking differently: Take 1 g by mouth 2 (two) times daily.) 420 mL 1   traMADol (ULTRAM) 50 MG tablet Take 25 mg by mouth 2 (two) times daily.     traZODone (DESYREL) 100 MG tablet Take 1 tablet (100 mg total) by mouth at bedtime. 90 tablet 1   Vitamin D, Ergocalciferol, (DRISDOL) 1.25 MG (50000 UNIT) CAPS capsule Take 1 capsule (50,000 Units total) by mouth every 7 (seven) days. 12x Weeks 5 capsule 3   pantoprazole (PROTONIX) 40 MG tablet Take 1 tablet by mouth once daily (Patient taking differently: Take 40 mg by mouth 2 (two) times daily.) 90 tablet 1   ketorolac (TORADOL) 10 MG tablet Take 1 tablet (10 mg total) by mouth every 6 (six) hours as needed. 20 tablet 0   No facility-administered medications prior to visit.    Allergies  Allergen Reactions   Clindamycin Rash and Shortness Of Breath   Clindamycin Hcl Shortness Of Breath and Rash   Penicillins  Anaphylaxis   Rosuvastatin Anaphylaxis   Baclofen Other (See Comments) and Rash   Lincomycin Other (See Comments)   Prednisone Rash    Other reaction(s): Rash, Hives - can tolerate if she takes with benadryl    Clindamycin Hcl     Other reaction(s): Shortness of Breath, Rash   Codeine Hives and Other (See Comments)    headache Other reaction(s): Headache, Nausea, headache   Doxycycline     Skin rash   Erythromycin Hives   Erythromycin Base Other (See Comments)    other   Haemophilus Influenzae Other (See Comments)    Local reaction at the site Local reaction at the site   Haemophilus Influenzae Vaccines Other (See Comments)    Local reaction at site   Latex Hives   Levofloxacin     Other reaction(s): sick   Other Other (See Comments)    Father had malignant hyperthermia   Pentazocine Other (See Comments)   Pentazocine Lactate Other (See Comments)    HALLUCINATION   Pneumococcal Vaccine Hives and Swelling    Other reaction(s): Hives, Shortness of Breath   Pneumococcal Vaccine Polyvalent Hives, Swelling and Other (See Comments)    REACTION: redness, swelling, and hives at injection site   Tamoxifen Nausea And Vomiting and Other (See Comments)    HEADACHE Other reaction(s): Headache, Nausea   Fluzone [Influenza Virus Vaccine] Hives    Local reaction at the site    Review of Systems  Constitutional:  Positive for fever (99-98.6 degree F) and malaise/fatigue.  HENT:  Positive for congestion and sore throat.   Respiratory:  Positive for cough and wheezing.        Objective:    Physical Exam Constitutional:      General: She is not in acute distress.    Appearance: Normal appearance. She is not ill-appearing.  HENT:     Head: Normocephalic and atraumatic.     Right Ear: External ear normal.     Left Ear: External ear normal.  Eyes:  Extraocular Movements: Extraocular movements intact.     Pupils: Pupils are equal, round, and reactive to light.  Cardiovascular:      Rate and Rhythm: Normal rate and regular rhythm.     Heart sounds: Normal heart sounds. No murmur heard.    No gallop.  Pulmonary:     Effort: Pulmonary effort is normal. No respiratory distress.     Breath sounds: Wheezing present. No rales.     Comments: Decreased breath sounds to bases  Lymphadenopathy:     Cervical: No cervical adenopathy.  Skin:    General: Skin is warm and dry.  Neurological:     Mental Status: She is alert and oriented to person, place, and time.  Psychiatric:        Judgment: Judgment normal.     BP 128/77 (BP Location: Right Arm, Patient Position: Sitting, Cuff Size: Small)   Pulse 72   Temp 98 F (36.7 C) (Oral)   Resp 16   Wt 127 lb (57.6 kg)   SpO2 96%   BMI 21.13 kg/m  Wt Readings from Last 3 Encounters:  04/09/23 127 lb (57.6 kg)  03/21/23 128 lb (58.1 kg)  03/20/23 128 lb (58.1 kg)       Assessment & Plan:  Acute bronchitis with COPD Assessment & Plan: New. Uncontrolled. Will obtain a stat CXR.    Addendum: CXR negative for PNA. Will begin prednisone taper, antibiotic coverage with doxycycline.  She is advised to call if symptoms worsen or if symptoms do not improve.   Orders: -     Comprehensive metabolic panel -     CBC with Differential/Platelet -     predniSONE; 4 tabs by mouth once daily for 2 days, then 3 tabs daily x 2 days, then 2 tabs daily x 2 days, then 1 tab daily x 2 days  Dispense: 20 tablet; Refill: 0 -     Pantoprazole Sodium; Take 1 tablet (40 mg total) by mouth daily.  Dispense: 90 tablet; Refill: 1 -     DG Chest 2 View; Future  Other orders -     Doxycycline Hyclate; Take 1 tablet (100 mg total) by mouth 2 (two) times daily.  Dispense: 14 tablet; Refill: 0    I, Lemont Fillers, NP, personally preformed the services described in this documentation.  All medical record entries made by the scribe were at my direction and in my presence.  I have reviewed the chart and discharge instructions (if  applicable) and agree that the record reflects my personal performance and is accurate and complete. 04/09/2023   I,Shehryar Baig,acting as a scribe for Lemont Fillers, NP.,have documented all relevant documentation on the behalf of Lemont Fillers, NP,as directed by  Lemont Fillers, NP while in the presence of Lemont Fillers, NP.   Lemont Fillers, NP

## 2023-04-09 NOTE — Assessment & Plan Note (Addendum)
New. Uncontrolled. Will obtain a stat CXR.    Addendum: CXR negative for PNA. Will begin prednisone taper, antibiotic coverage with doxycycline.  She is advised to call if symptoms worsen or if symptoms do not improve.

## 2023-04-09 NOTE — Telephone Encounter (Signed)
Patient notified of results and new prescription.

## 2023-04-09 NOTE — Telephone Encounter (Signed)
Please advise pt that her CXR is negative for pneumonia. I did go ahead and send doxycycline antibiotic to her pharmacy though for her to begin for her bronchitis. This is in addition to the prednisone.

## 2023-04-10 ENCOUNTER — Other Ambulatory Visit: Payer: Self-pay | Admitting: Cardiology

## 2023-04-10 ENCOUNTER — Ambulatory Visit: Payer: Medicare Other | Admitting: Physical Therapy

## 2023-04-12 ENCOUNTER — Encounter: Payer: Medicare Other | Admitting: Physical Therapy

## 2023-04-16 ENCOUNTER — Other Ambulatory Visit: Payer: Self-pay | Admitting: Family Medicine

## 2023-04-16 ENCOUNTER — Telehealth: Payer: Self-pay

## 2023-04-16 MED ORDER — BENZONATATE 100 MG PO CAPS: 100.0000 mg | ORAL_CAPSULE | Freq: Three times a day (TID) | ORAL | 0 refills | Status: DC | PRN

## 2023-04-16 NOTE — Telephone Encounter (Signed)
Pt called back stating she still had a bad cough and she is supposed to go out of town for a wedding Wednesday. She stated she finished her antibiotics but does not know what else to do. Please advise.

## 2023-04-16 NOTE — Telephone Encounter (Signed)
done

## 2023-04-16 NOTE — Telephone Encounter (Signed)
Sent pt mychart message

## 2023-04-23 ENCOUNTER — Telehealth: Payer: Self-pay | Admitting: Family Medicine

## 2023-04-23 NOTE — Telephone Encounter (Signed)
Pt said that she is still not better. She is coughing a lot. There is brownish-green mucus and she has a low grade fever. Pt wanted to see what Dr. Abner Greenspan thought first before scheduling appt since she has been seen for this. Please call her to advise.

## 2023-04-23 NOTE — Telephone Encounter (Signed)
Called pt was advised she will need to  Be seen. Appointment was made for tomorrow.

## 2023-04-24 ENCOUNTER — Ambulatory Visit (INDEPENDENT_AMBULATORY_CARE_PROVIDER_SITE_OTHER): Payer: Medicare Other | Admitting: Family Medicine

## 2023-04-24 VITALS — BP 122/68 | HR 72 | Temp 98.2°F | Resp 16 | Ht 64.0 in | Wt 126.8 lb

## 2023-04-24 DIAGNOSIS — R739 Hyperglycemia, unspecified: Secondary | ICD-10-CM

## 2023-04-24 DIAGNOSIS — J209 Acute bronchitis, unspecified: Secondary | ICD-10-CM | POA: Diagnosis not present

## 2023-04-24 DIAGNOSIS — K219 Gastro-esophageal reflux disease without esophagitis: Secondary | ICD-10-CM | POA: Diagnosis not present

## 2023-04-24 DIAGNOSIS — J020 Streptococcal pharyngitis: Secondary | ICD-10-CM | POA: Diagnosis not present

## 2023-04-24 DIAGNOSIS — R42 Dizziness and giddiness: Secondary | ICD-10-CM

## 2023-04-24 DIAGNOSIS — M810 Age-related osteoporosis without current pathological fracture: Secondary | ICD-10-CM | POA: Diagnosis not present

## 2023-04-24 DIAGNOSIS — R531 Weakness: Secondary | ICD-10-CM

## 2023-04-24 DIAGNOSIS — J440 Chronic obstructive pulmonary disease with acute lower respiratory infection: Secondary | ICD-10-CM | POA: Diagnosis not present

## 2023-04-24 DIAGNOSIS — Z889 Allergy status to unspecified drugs, medicaments and biological substances status: Secondary | ICD-10-CM

## 2023-04-24 LAB — POCT INFLUENZA A/B: Influenza B, POC: NEGATIVE

## 2023-04-24 LAB — POCT RAPID STREP A (OFFICE): Rapid Strep A Screen: POSITIVE — AB

## 2023-04-24 LAB — POC COVID19 BINAXNOW: SARS Coronavirus 2 Ag: NEGATIVE

## 2023-04-24 MED ORDER — METHYLPREDNISOLONE 4 MG PO TBPK: ORAL_TABLET | ORAL | 0 refills | Status: DC

## 2023-04-24 MED ORDER — AZITHROMYCIN 250 MG PO TABS: ORAL_TABLET | ORAL | 0 refills | Status: AC

## 2023-04-24 NOTE — Assessment & Plan Note (Signed)
Started on Azithromycin and Medrol increase rest and hydration. Report any concerns

## 2023-04-24 NOTE — Progress Notes (Signed)
Subjective:   By signing my name below, I, Vickey Sages, attest that this documentation has been prepared under the direction and in the presence of Bradd Canary, MD., 04/24/2023.   Patient ID: [name], female    DOB: [birth year], 77 y.o.   MRN: [id number]  Chief Complaint  Patient presents with   Cough    Here for Cough    HPI Patient is in today for an office visit.  Streptococcal Pharyngitis Patient presents today with sore throat, myalgias, fatigue, and headache, and has tested positive for Streptococcal pharyngitis. She reports that she has been feeling sick for the past 9 weeks with her condition fluctuating between improving and worsening, though she had an elevated fever which started on 04/21/2023. She was seen by Sandford Craze, NP, on 04/09/2023 who prescribed Doxycycline Hyclate 100 mg. Today she is interested in starting another antibiotic and steroid.  Dizziness/Falls Patient is requesting a renewal on physical therapy to manage her dizziness and falls.   Past Medical History:  Diagnosis Date   Allergy    Anemia    Anxiety    past hx    Arthritis    Asthma    Atrial flutter (HCC)    past history- not current   Breast cancer (HCC)    CAD (coronary artery disease) CARDIOLOGIST--  DR Clifton James   mild non-obstructive cad   Cancer (HCC)    right   Cataract    bilaterally removed    Chronic constipation    Chronic kidney disease    stage 3 ckd, interstitial cystitis   Concussion    x 3   COPD (chronic obstructive pulmonary disease) (HCC)    Depression    past hx    Dyspnea    Dysrhythmia    Family history of adverse reaction to anesthesia    Family history of malignant hyperthermia    father had this   Fibromyalgia    Frequency of urination    GERD (gastroesophageal reflux disease)    H/O hiatal hernia    History of basal cell carcinoma excision    X2   History of breast cancer ONCOLOGIST-- DR Darnelle Catalan---  NO RECURRANCE   DX 07/2012;  LOW  GRADE DCIS  ER+PR+  ----  S/P RIGHT LUMPECTOMY WITH NEGATIVE MARGINS/   RADIATION ENDED 11/2012   History of chronic bronchitis    History of colonic polyps    History of tachycardia    CONTROLLED  WITH ATENOLOL   Hyperlipidemia    Hypertension    Neuromuscular disorder (HCC)    fibromyalgia   Neuropathy    Osteoporosis 01/2019   T score -2.2 stable/improved from prior study   Pelvic pain    Personal history of radiation therapy    Pneumonia    S/P radiation therapy 11/12/12 - 12/05/12   right Breast   Sepsis (HCC) 2014   from UTI    Sinus headache    Sjogren's disease (HCC)    Urgency of urination     Past Surgical History:  Procedure Laterality Date   2 HOUR PH STUDY N/A 04/03/2016   Procedure: 24 HOUR PH STUDY;  Surgeon: Ruffin Frederick, MD;  Location: [facility];  Service: Gastroenterology;  Laterality: N/A;   BREAST BIOPSY Right 08/23/2012   ADH   BREAST BIOPSY Right 10/11/2012   Ductal Carcinoma   BREAST EXCISIONAL BIOPSY Right 09/04/2013   benign   BREAST LUMPECTOMY Right 10/11/2012   W/ [facility] BX  CARDIAC CATHETERIZATION  09-13-2007  DR Riley Kill   WELL-PRESERVED LVF/  DIFFUSE SCATTERED CORONARY CALCIFACATION AND ATHEROSCLEROSIS WITHOUT OBSTRUCTION   CARDIAC CATHETERIZATION  08-04-2010  DR Clifton James   NON-OBSTRUCTIVE CAD/  pLAD 40%/  oLAD 30%/  mLAD 30%/  pRCA 30%/  EF 60%   CARDIOVASCULAR STRESS TEST  06-18-2012  DR McALHANY   LOW RISK NUCLEAR STUDY/  SMALL FIXED AREA OF MODERATELY DECREASED UPTAKE IN ANTEROSEPTAL WALL WHICH MAY BE ARTIFACTUAL/  NO ISCHEMIA/  EF 68%   COLONOSCOPY  09/29/2010   CYSTOSCOPY     CYSTOSCOPY WITH HYDRODISTENSION AND BIOPSY N/A 03/06/2014   Procedure: CYSTOSCOPY/HYDRODISTENSION/ INSTILATION OF MARCAINE AND PYRIDIUM;  Surgeon: Kathi Ludwig, MD;  Location: [facility];  Service: Urology;  Laterality: N/A;   CYSTOSCOPY/URETEROSCOPY/HOLMIUM LASER/STENT PLACEMENT Left 03/21/2023   Procedure: CYSTOSCOPY LEFT  RETROGRADE PYELOGRAM, LEFT URETEROSCOPY, HOLMIUM LASER LITHOTRIPSYM, AND LEFT URETERAL STENT PLACEMENT;  Surgeon: Rene Paci, MD;  Location: [facility];  Service: Urology;  Laterality: Left;  60 MINUTES   ESOPHAGEAL MANOMETRY N/A 04/03/2016   Procedure: ESOPHAGEAL MANOMETRY (EM);  Surgeon: Ruffin Frederick, MD;  Location: [facility];  Service: Gastroenterology;  Laterality: N/A;   EXTRACORPOREAL SHOCK WAVE LITHOTRIPSY Left 02/06/2019   Procedure: EXTRACORPOREAL SHOCK WAVE LITHOTRIPSY (ESWL);  Surgeon: Ihor Gully, MD;  Location: [facility];  Service: Urology;  Laterality: Left;   NASAL SINUS SURGERY  1985   ORIF RIGHT ANKLE  FX  2006   POLYPECTOMY     REMOVAL VOCAL CORD CYST  01/2013   RIGHT BREAST BX  08/23/2012   RIGHT HAND SURGERY  X3  LAST ONE 2009   INCLUDES  ORIF RIGHT 5TH FINGER AND REVISION TWICE   SKIN CANCER EXCISION     face,arms   TONSILLECTOMY AND ADENOIDECTOMY  AGE 72   TOTAL ABDOMINAL HYSTERECTOMY W/ BILATERAL SALPINGOOPHORECTOMY  1982   W/  APPENDECTOMY   TRANSTHORACIC ECHOCARDIOGRAM  06/24/2012   GRADE I DIASTOLIC DYSFUNCTION/  EF 55-60%/  MILD MR   UPPER GASTROINTESTINAL ENDOSCOPY      Family History  Problem Relation Age of Onset   Rectal cancer Mother    Colon cancer Mother    Pancreatic cancer Mother    Diabetes Mother    Breast cancer Mother 16   Breast cancer Maternal Aunt        breast   Birth defects Maternal Aunt    Irritable bowel syndrome Son    Heart disease Son        CAD, MV replacement   Allergic Disorder Daughter    Diabetes Daughter    Colon cancer Father    Colon polyps Father    Diabetes Father    Stroke Father    Heart disease Father        CHF   Hyperlipidemia Father    Hypertension Father    Arthritis Father    Breast cancer Paternal Aunt    Breast cancer Paternal Aunt    Arthritis Paternal Uncle    Allergic Disorder Daughter    Heart disease Cousin        CAD, had a blood clot after stenting   Esophageal  cancer Neg Hx    Stomach cancer Neg Hx     Social History   Socioeconomic History   Marital status: Married    Spouse name: Windy Fast    Number of children: 3   Years of education: BA   Highest education level: Not on file  Occupational History  Occupation: Retired Teacher, adult education: RETIRED  Tobacco Use   Smoking status: Former    Packs/day: 1.00    Years: 15.00    Additional pack years: 0.00    Total pack years: 15.00    Types: Cigarettes    Quit date: 05/27/2005    Years since quitting: 17.9   Smokeless tobacco: Never  Vaping Use   Vaping Use: Never used  Substance and Sexual Activity   Alcohol use: No    Alcohol/week: 0.0 standard drinks of alcohol   Drug use: No   Sexual activity: Not Currently    Partners: Male    Birth control/protection: Surgical    Comment: 1st intercourse 77 yo-Fewer than 5 partners, hysterectomy  Other Topics Concern   Not on file  Social History Narrative   Lives with husband.   Caffeine use: 1/2 cup per day   Exercise-- 2days a week [employer],  Water aerobics, walking       Retired Engineer, civil (consulting)   No dietary restrictions, tries to maintain a heart healthy diet   Social Determinants of Health   Financial Resource Strain: Low Risk  (02/26/2023)   Overall Financial Resource Strain (CARDIA)    Difficulty of Paying Living Expenses: Not hard at all  Food Insecurity: No Food Insecurity (02/26/2023)   Hunger Vital Sign    Worried About Running Out of Food in the Last Year: Never true    Ran Out of Food in the Last Year: Never true  Transportation Needs: No Transportation Needs (02/26/2023)   PRAPARE - Administrator, Civil Service (Medical): No    Lack of Transportation (Non-Medical): No  Physical Activity: Inactive (02/26/2023)   Exercise Vital Sign    Days of Exercise per Week: 0 days    Minutes of Exercise per Session: 10 min  Stress: No Stress Concern Present (02/26/2023)   [employer] of Occupational Health - Occupational Stress  Questionnaire    Feeling of Stress : Only a little  Social Connections: Unknown (02/26/2023)   Social Connection and Isolation Panel [NHANES]    Frequency of Communication with Friends and Family: More than three times a week    Frequency of Social Gatherings with Friends and Family: Patient declined    Attends Religious Services: Not on Insurance claims handler of Clubs or Organizations: Yes    Attends Banker Meetings: More than 4 times per year    Marital Status: Married  Catering manager Violence: Not At Risk (03/05/2023)   Humiliation, Afraid, Rape, and Kick questionnaire    Fear of Current or Ex-Partner: No    Emotionally Abused: No    Physically Abused: No    Sexually Abused: No    Outpatient Medications Prior to Visit  Medication Sig Dispense Refill   benzonatate (TESSALON PERLES) 100 MG capsule Take 1-2 capsules (100-200 mg total) by mouth 3 (three) times daily as needed for cough. 60 capsule 0   acetaminophen (TYLENOL) 500 MG tablet Take 1,000 mg by mouth every 6 (six) hours as needed for mild pain or headache.      albuterol (VENTOLIN HFA) 108 (90 Base) MCG/ACT inhaler Inhale 2 puffs into the lungs every 4 (four) hours as needed. 1 each 0   amLODipine (NORVASC) 5 MG tablet Take 1 tablet (5 mg total) by mouth daily. 90 tablet 3   aspirin EC 81 MG tablet Take 1 tablet (81 mg total) by mouth daily. 90 tablet 3   atenolol (TENORMIN)  25 MG tablet Take 50 mg by mouth 2 (two) times daily.     CALCIUM PO Take 500 mg by mouth daily.     carboxymethylcellul-glycerin (OPTIVE) 0.5-0.9 % ophthalmic solution Place 1 drop into both eyes daily as needed for dry eyes.     Cholecalciferol (VITAMIN D3) 50 MCG (2000 UT) CAPS Take 2,000 Units by mouth daily.     cyanocobalamin (VITAMIN B12) 1000 MCG tablet Take 1,000 mg by mouth daily with breakfast.     diclofenac Sodium (VOLTAREN) 1 % GEL Apply 2 g topically daily as needed (Back pain).     dicyclomine (BENTYL) 10 MG capsule Take 1  capsule (10 mg total) by mouth 2 (two) times daily as needed for spasms. (Patient taking differently: Take 10 mg by mouth 3 (three) times a week.) 30 capsule 0   diphenhydrAMINE (BENADRYL) 25 mg capsule Take 25 mg by mouth every 6 (six) hours as needed for itching.     Docusate Sodium (DSS) 100 MG CAPS Take 100 mg by mouth daily as needed (Constipation).     doxycycline (VIBRA-TABS) 100 MG tablet Take 1 tablet (100 mg total) by mouth 2 (two) times daily. 14 tablet 0   DULoxetine (CYMBALTA) 30 MG capsule Take 1 capsule (30 mg total) by mouth daily. 30 capsule 1   DULoxetine (CYMBALTA) 60 MG capsule Take 1 capsule by mouth twice daily 180 capsule 0   Evolocumab (REPATHA SURECLICK) 140 MG/ML SOAJ Inject 140 mg into the skin every 14 (fourteen) days. 6 mL 1   fluticasone (FLONASE) 50 MCG/ACT nasal spray Use 2 spray(s) in each nostril once daily (Patient taking differently: Place 2 sprays into both nostrils daily as needed for allergies or rhinitis.) 16 g 5   fluticasone (FLOVENT HFA) 110 MCG/ACT inhaler Inhale 2 puffs into the lungs 2 (two) times daily. (Patient taking differently: Inhale 2 puffs into the lungs daily as needed (Short of breath).) 1 each 5   furosemide (LASIX) 20 MG tablet Take 1 tablet (20 mg total) by mouth as needed for edema. 90 tablet 3   gabapentin (NEURONTIN) 300 MG capsule Take 300-900 mg by mouth See admin instructions. Take 600 mg in the morning 300 mg at lunch time and 900 mg at bedtime     hydroxychloroquine (PLAQUENIL) 200 MG tablet Take 200 mg by mouth daily.     loratadine (CLARITIN) 10 MG tablet Take 10 mg by mouth daily as needed for rhinitis.     Multiple Vitamins-Minerals (MULTIVITAMIN WITH MINERALS) tablet Take 1 tablet by mouth daily. (Patient taking differently: Take 1 tablet by mouth daily. 50+)     nitroGLYCERIN (NITROSTAT) 0.4 MG SL tablet Place 1 tablet (0.4 mg total) under the tongue every 5 (five) minutes as needed for chest pain. 25 tablet 1   nystatin cream  (MYCOSTATIN) Apply 1 application topically 2 (two) times daily. (Patient taking differently: Apply 1 application  topically daily as needed (vaginal yeast).) 30 g 2   ondansetron (ZOFRAN) 4 MG tablet Take 1 tablet (4 mg total) by mouth daily as needed for nausea or vomiting. 30 tablet 1   pantoprazole (PROTONIX) 40 MG tablet Take 1 tablet (40 mg total) by mouth daily. 90 tablet 1   pentosan polysulfate (ELMIRON) 100 MG capsule Take 1 capsule (100 mg total) by mouth 3 (three) times daily. Reported on 01/05/2016 (Patient taking differently: Take 100 mg by mouth daily as needed (burning and pain). Reported on 01/05/2016) 90 capsule 11   phenazopyridine (PYRIDIUM) 200  MG tablet Take 1 tablet (200 mg total) by mouth 3 (three) times daily as needed (for pain with urination). 30 tablet 0   polyethylene glycol (MIRALAX) 17 g packet Take 17 g by mouth daily. (Patient taking differently: Take 17 g by mouth daily as needed for mild constipation or moderate constipation.) 14 each 0   predniSONE (DELTASONE) 10 MG tablet 4 tabs by mouth once daily for 2 days, then 3 tabs daily x 2 days, then 2 tabs daily x 2 days, then 1 tab daily x 2 days 20 tablet 0   sucralfate (CARAFATE) 1 GM/10ML suspension Take 10 mLs (1 g total) by mouth 4 (four) times daily -  with meals and at bedtime. (Patient taking differently: Take 1 g by mouth 2 (two) times daily.) 420 mL 1   traMADol (ULTRAM) 50 MG tablet Take 25 mg by mouth 2 (two) times daily.     traZODone (DESYREL) 100 MG tablet Take 1 tablet (100 mg total) by mouth at bedtime. 90 tablet 1   Vitamin D, Ergocalciferol, (DRISDOL) 1.25 MG (50000 UNIT) CAPS capsule Take 1 capsule (50,000 Units total) by mouth every 7 (seven) days. 12x Weeks 5 capsule 3   No facility-administered medications prior to visit.    Allergies  Allergen Reactions   Clindamycin Rash and Shortness Of Breath   Clindamycin Hcl Shortness Of Breath and Rash   Penicillins Anaphylaxis   Rosuvastatin Anaphylaxis    Baclofen Other (See Comments) and Rash   Lincomycin Other (See Comments)   Prednisone Rash    Other reaction(s): Rash, Hives - can tolerate if she takes with benadryl    Clindamycin Hcl     Other reaction(s): Shortness of Breath, Rash   Codeine Hives and Other (See Comments)    headache Other reaction(s): Headache, Nausea, headache   Doxycycline     Skin rash   Erythromycin Hives   Erythromycin Base Other (See Comments)    other   Haemophilus Influenzae Other (See Comments)    Local reaction at the site Local reaction at the site   Haemophilus Influenzae Vaccines Other (See Comments)    Local reaction at site   Latex Hives   Levofloxacin     Other reaction(s): sick   Other Other (See Comments)    Father had malignant hyperthermia   Pentazocine Other (See Comments)   Pentazocine Lactate Other (See Comments)    HALLUCINATION   Pneumococcal Vaccine Hives and Swelling    Other reaction(s): Hives, Shortness of Breath   Pneumococcal Vaccine Polyvalent Hives, Swelling and Other (See Comments)    REACTION: redness, swelling, and hives at injection site   Tamoxifen Nausea And Vomiting and Other (See Comments)    HEADACHE Other reaction(s): Headache, Nausea   Fluzone [Influenza Virus Vaccine] Hives    Local reaction at the site    Review of Systems  Constitutional:  Positive for malaise/fatigue. Negative for chills and fever.  Respiratory:  Negative for shortness of breath.   Cardiovascular:  Negative for chest pain and palpitations.  Gastrointestinal:  Negative for abdominal pain, blood in stool, constipation, diarrhea, nausea and vomiting.  Genitourinary:  Negative for dysuria, frequency, hematuria and urgency.  Musculoskeletal:  Positive for myalgias.  Skin:           Neurological:  Positive for headaches.       Objective:    Physical Exam Constitutional:      General: She is not in acute distress.    Appearance: Normal appearance. She  is not ill-appearing.  HENT:      Head: Normocephalic and atraumatic.     Right Ear: External ear normal.     Left Ear: External ear normal.     Nose: Nose normal.     Mouth/Throat:     Mouth: Mucous membranes are moist.     Pharynx: Oropharynx is clear. Posterior oropharyngeal erythema present.     Comments: Pharynx is erythematic but there are no lesions or pus. Eyes:     General:        Right eye: No discharge.        Left eye: No discharge.     Extraocular Movements: Extraocular movements intact.     Conjunctiva/sclera: Conjunctivae normal.     Pupils: Pupils are equal, round, and reactive to light.  Cardiovascular:     Rate and Rhythm: Normal rate and regular rhythm.     Pulses: Normal pulses.     Heart sounds: Normal heart sounds. No murmur heard.    No gallop.  Pulmonary:     Effort: Pulmonary effort is normal. No respiratory distress.     Breath sounds: Normal breath sounds. No wheezing or rales.  Abdominal:     General: Bowel sounds are normal.     Palpations: Abdomen is soft.     Tenderness: There is no abdominal tenderness. There is no guarding.  Musculoskeletal:        General: Normal range of motion.     Cervical back: Normal range of motion.     Right lower leg: No edema.     Left lower leg: No edema.  Skin:    General: Skin is warm and dry.  Neurological:     Mental Status: She is alert and oriented to person, place, and time.  Psychiatric:        Mood and Affect: Mood normal.        Behavior: Behavior normal.        Judgment: Judgment normal.     BP 122/68 (BP Location: Right Arm, Patient Position: Sitting, Cuff Size: Normal)   Pulse 72   Temp 98.2 F (36.8 C) (Oral)   Resp 16   Ht 5\' 4"  (1.626 m)   Wt 126 lb 12.8 oz (57.5 kg)   SpO2 95%   BMI 21.77 kg/m  Wt Readings from Last 3 Encounters:  04/24/23 126 lb 12.8 oz (57.5 kg)  04/09/23 127 lb (57.6 kg)  03/21/23 128 lb (58.1 kg)    Diabetic Foot Exam - Simple   No data filed    Lab Results  Component Value Date    WBC 5.8 04/09/2023   HGB 12.2 04/09/2023   HCT 38.0 04/09/2023   PLT 369.0 04/09/2023   GLUCOSE 107 (H) 04/09/2023   CHOL 124 01/22/2023   TRIG 152.0 (H) 01/22/2023   HDL 45.00 01/22/2023   LDLDIRECT 117.3 04/08/2012   LDLCALC 48 01/22/2023   ALT 9 04/09/2023   AST 19 04/09/2023   NA 140 04/09/2023   K 3.9 04/09/2023   CL 102 04/09/2023   CREATININE 1.14 04/09/2023   BUN 11 04/09/2023   CO2 28 04/09/2023   TSH 2.03 01/22/2023   INR 1.05 08/03/2010   HGBA1C 5.9 10/27/2022   MICROALBUR 0.2 07/10/2013    Lab Results  Component Value Date   TSH 2.03 01/22/2023   Lab Results  Component Value Date   WBC 5.8 04/09/2023   HGB 12.2 04/09/2023   HCT 38.0 04/09/2023   MCV 87.4  04/09/2023   PLT 369.0 04/09/2023   Lab Results  Component Value Date   NA 140 04/09/2023   K 3.9 04/09/2023   CHLORIDE 102 04/20/2015   CO2 28 04/09/2023   GLUCOSE 107 (H) 04/09/2023   BUN 11 04/09/2023   CREATININE 1.14 04/09/2023   BILITOT 0.5 04/09/2023   ALKPHOS 104 04/09/2023   AST 19 04/09/2023   ALT 9 04/09/2023   PROT 6.3 04/09/2023   ALBUMIN 3.8 04/09/2023   CALCIUM 8.9 04/09/2023   ANIONGAP 9 03/20/2023   EGFR 49 (L) 04/20/2015   GFR 46.35 (L) 04/09/2023   Lab Results  Component Value Date   CHOL 124 01/22/2023   Lab Results  Component Value Date   HDL 45.00 01/22/2023   Lab Results  Component Value Date   LDLCALC 48 01/22/2023   Lab Results  Component Value Date   TRIG 152.0 (H) 01/22/2023   Lab Results  Component Value Date   CHOLHDL 3 01/22/2023   Lab Results  Component Value Date   HGBA1C 5.9 10/27/2022      Assessment & Plan:  Dizziness/Falls: Referral placed to [facility] physical therapy.  Streptococcal Pharyngitis: Encouraged hydration and protein. Prescribed Azithromycin 250 mg and Methylprednisolone 4 mg. Referral placed to allergist due to multiple drug allergies.  Problem List Items Addressed This Visit     Acute bronchitis with COPD (HCC) -  Primary    Continues to struggle with cough despite recent treatment and Tessalon Perles      Relevant Medications   azithromycin (ZITHROMAX) 250 MG tablet   methylPREDNISolone (MEDROL DOSEPAK) 4 MG TBPK tablet   Other Relevant Orders   POCT Influenza A/B (Completed)   POCT rapid strep A (Completed)   POC COVID-19 (Completed)   GERD (gastroesophageal reflux disease) (Chronic)    Avoid offending foods, start probiotics. Do not eat large meals in late evening and consider raising head of bed.        Hyperglycemia    hgba1c acceptable, minimize simple carbs. Increase exercise as tolerated.       Osteoporosis    Encouraged to get adequate exercise, calcium and vitamin d intake       Strep pharyngitis    Started on Azithromycin and Medrol increase rest and hydration. Report any concerns      Relevant Medications   azithromycin (ZITHROMAX) 250 MG tablet   Other Relevant Orders   POCT rapid strep A (Completed)   Other Visit Diagnoses     Dizzy spells       Relevant Orders   Ambulatory referral to Physical Therapy   Weakness       Relevant Orders   Ambulatory referral to Physical Therapy   Multiple drug allergies       Relevant Orders   Ambulatory referral to Allergy      Meds ordered this encounter  Medications   azithromycin (ZITHROMAX) 250 MG tablet    Sig: Take 2 tablets on day 1, then 1 tablet daily on days 2 through 5    Dispense:  6 tablet    Refill:  0   methylPREDNISolone (MEDROL DOSEPAK) 4 MG TBPK tablet    Sig: 5 tabs po x 1 day then 4 tabs po x 1 day then 3 tabs po x 1 day then 2 tabs po x 1 day then 1 tab po x 1 day and stop    Dispense:  15 tablet    Refill:  0   I, Danise Edge, MD,  personally preformed the services described in this documentation.  All medical record entries made by the scribe were at my direction and in my presence.  I have reviewed the chart and discharge instructions (if applicable) and agree that the record reflects my personal  performance and is accurate and complete. 04/24/2023  I,Mohammed Iqbal,acting as a scribe for Danise Edge, MD.,have documented all relevant documentation on the behalf of Danise Edge, MD,as directed by  Danise Edge, MD while in the presence of Danise Edge, MD.  Danise Edge, MD

## 2023-04-24 NOTE — Assessment & Plan Note (Signed)
Avoid offending foods, start probiotics. Do not eat large meals in late evening and consider raising head of bed.  

## 2023-04-24 NOTE — Patient Instructions (Signed)

## 2023-04-24 NOTE — Assessment & Plan Note (Signed)
[id number] acceptable, minimize simple carbs. Increase exercise as tolerated.  

## 2023-04-24 NOTE — Assessment & Plan Note (Signed)
Encouraged to get adequate exercise, calcium and vitamin d intake 

## 2023-04-24 NOTE — Assessment & Plan Note (Signed)
Continues to struggle with cough despite recent treatment and Tessalon Perles

## 2023-04-25 ENCOUNTER — Other Ambulatory Visit: Payer: Self-pay | Admitting: Family Medicine

## 2023-04-25 ENCOUNTER — Telehealth: Payer: Self-pay | Admitting: Family Medicine

## 2023-04-25 ENCOUNTER — Telehealth: Payer: Self-pay

## 2023-04-25 MED ORDER — HYDROXYZINE HCL 10 MG PO TABS: 10.0000 mg | ORAL_TABLET | Freq: Three times a day (TID) | ORAL | 1 refills | Status: DC | PRN

## 2023-04-25 MED ORDER — FAMOTIDINE 20 MG PO TABS: 20.0000 mg | ORAL_TABLET | Freq: Two times a day (BID) | ORAL | 1 refills | Status: DC | PRN

## 2023-04-25 NOTE — Telephone Encounter (Signed)
PA initiated via Covermymeds; KEY: [id number]. Awaiting determination.

## 2023-04-25 NOTE — Telephone Encounter (Signed)
PA approved.   Approved. Authorization Expiration Date: 04/24/2024

## 2023-04-25 NOTE — Telephone Encounter (Signed)
Pt called stating that she had taken her first dose of methylpredniolone and she had broken out in a surface rash. Pt stated she has taken benadryl. Pt is wanting to know if she should take her next dose with benadryl.

## 2023-04-25 NOTE — Progress Notes (Signed)
HPI:FU CAD. Cardiac catheterization in August 2011 showed a 40% proximal LAD, 30% mid LAD, 30% RCA and her ejection fraction was 60%. Nuclear study in June 2013 showed no ischemia with an ejection fraction of 68%. Nuclear study March 2016 showed ejection fraction 67%. Breast attenuation but no ischemia. MRA of the head October 2018 normal. Renal dopplers 1/19 showed no RAS.  Echocardiogram repeated November 2022 and showed normal LV function, grade 1 diastolic dysfunction, mild mitral regurgitation.  Since last seen she denies dyspnea, chest pain, palpitations, syncope or pedal edema.  Current Outpatient Medications  Medication Sig Dispense Refill   acetaminophen (TYLENOL) 500 MG tablet Take 1,000 mg by mouth every 6 (six) hours as needed for mild pain or headache.      albuterol (VENTOLIN HFA) 108 (90 Base) MCG/ACT inhaler Inhale 2 puffs into the lungs every 4 (four) hours as needed. 1 each 0   amLODipine (NORVASC) 5 MG tablet Take 1 tablet (5 mg total) by mouth daily. 90 tablet 3   aspirin EC 81 MG tablet Take 1 tablet (81 mg total) by mouth daily. 90 tablet 3   atenolol (TENORMIN) 25 MG tablet Take 25 mg by mouth 2 (two) times daily. Take 1 tablet by mouth 2 time a day     benzonatate (TESSALON PERLES) 100 MG capsule Take 1-2 capsules (100-200 mg total) by mouth 3 (three) times daily as needed for cough. 60 capsule 0   CALCIUM PO Take 500 mg by mouth daily.     carboxymethylcellul-glycerin (OPTIVE) 0.5-0.9 % ophthalmic solution Place 1 drop into both eyes daily as needed for dry eyes.     cefdinir (OMNICEF) 300 MG capsule Take 1 capsule (300 mg total) by mouth 2 (two) times daily for 7 days. 14 capsule 0   Cholecalciferol (VITAMIN D3) 50 MCG (2000 UT) CAPS Take 2,000 Units by mouth daily.     cyanocobalamin (VITAMIN B12) 1000 MCG tablet Take 1,000 mg by mouth daily with breakfast.     diclofenac Sodium (VOLTAREN) 1 % GEL Apply 2 g topically daily as needed (Back pain).     dicyclomine  (BENTYL) 10 MG capsule Take 1 capsule (10 mg total) by mouth 2 (two) times daily as needed for spasms. (Patient taking differently: Take 10 mg by mouth 3 (three) times a week.) 30 capsule 0   diphenhydrAMINE (BENADRYL) 25 mg capsule Take 25 mg by mouth every 6 (six) hours as needed for itching.     Docusate Sodium (DSS) 100 MG CAPS Take 100 mg by mouth daily as needed (Constipation).     DULoxetine (CYMBALTA) 30 MG capsule Take 1 capsule (30 mg total) by mouth daily. 30 capsule 1   DULoxetine (CYMBALTA) 60 MG capsule Take 1 capsule by mouth twice daily 180 capsule 0   famotidine (PEPCID) 20 MG tablet Take 1 tablet (20 mg total) by mouth 2 (two) times daily as needed for heartburn or indigestion. 60 tablet 1   fluticasone (FLONASE) 50 MCG/ACT nasal spray Use 2 spray(s) in each nostril once daily (Patient taking differently: Place 2 sprays into both nostrils daily as needed for allergies or rhinitis.) 16 g 5   fluticasone (FLOVENT HFA) 110 MCG/ACT inhaler Inhale 2 puffs into the lungs 2 (two) times daily. (Patient taking differently: Inhale 2 puffs into the lungs daily as needed (Short of breath).) 1 each 5   furosemide (LASIX) 20 MG tablet Take 1 tablet (20 mg total) by mouth as needed for edema. 90 tablet  3   gabapentin (NEURONTIN) 300 MG capsule Take 300-900 mg by mouth See admin instructions. Take 600 mg in the morning 300 mg at lunch time and 900 mg at bedtime     hydroxychloroquine (PLAQUENIL) 200 MG tablet Take 200 mg by mouth daily.     hydrOXYzine (ATARAX) 10 MG tablet Take 1 tablet (10 mg total) by mouth 3 (three) times daily as needed. 30 tablet 1   loratadine (CLARITIN) 10 MG tablet Take 10 mg by mouth daily as needed for rhinitis.     Multiple Vitamins-Minerals (MULTIVITAMIN WITH MINERALS) tablet Take 1 tablet by mouth daily. (Patient taking differently: Take 1 tablet by mouth daily. 50+)     nitroGLYCERIN (NITROSTAT) 0.4 MG SL tablet Place 1 tablet (0.4 mg total) under the tongue every 5  (five) minutes as needed for chest pain. 25 tablet 1   nystatin cream (MYCOSTATIN) Apply 1 application topically 2 (two) times daily. (Patient taking differently: Apply 1 application  topically daily as needed (vaginal yeast).) 30 g 2   pantoprazole (PROTONIX) 40 MG tablet Take 1 tablet (40 mg total) by mouth daily. 90 tablet 1   pentosan polysulfate (ELMIRON) 100 MG capsule Take 1 capsule (100 mg total) by mouth 3 (three) times daily. Reported on 01/05/2016 (Patient taking differently: Take 100 mg by mouth daily as needed (burning and pain). Reported on 01/05/2016) 90 capsule 11   polyethylene glycol (MIRALAX) 17 g packet Take 17 g by mouth daily. (Patient taking differently: Take 17 g by mouth daily as needed for mild constipation or moderate constipation.) 14 each 0   REPATHA SURECLICK 140 MG/ML SOAJ INJECT 140 MG INTO THE SKIN EVERY 14 DAYS 6 mL 0   sucralfate (CARAFATE) 1 GM/10ML suspension Take 10 mLs (1 g total) by mouth 4 (four) times daily -  with meals and at bedtime. (Patient taking differently: Take 1 g by mouth 2 (two) times daily.) 420 mL 1   traMADol (ULTRAM) 50 MG tablet Take 25 mg by mouth 2 (two) times daily.     traZODone (DESYREL) 100 MG tablet Take 1 tablet (100 mg total) by mouth at bedtime. 90 tablet 1   Vitamin D, Ergocalciferol, (DRISDOL) 1.25 MG (50000 UNIT) CAPS capsule Take 1 capsule (50,000 Units total) by mouth every 7 (seven) days. 12x Weeks 5 capsule 3   No current facility-administered medications for this visit.     Past Medical History:  Diagnosis Date   Allergy    Anemia    Anxiety    past hx    Arthritis    Asthma    Atrial flutter (HCC)    past history- not current   Breast cancer (HCC)    CAD (coronary artery disease) CARDIOLOGIST--  DR Clifton James   mild non-obstructive cad   Cancer (HCC)    right   Cataract    bilaterally removed    Chronic constipation    Chronic kidney disease    stage 3 ckd, interstitial cystitis   Concussion    x 3   COPD  (chronic obstructive pulmonary disease) (HCC)    Depression    past hx    Dyspnea    Dysrhythmia    Family history of adverse reaction to anesthesia    Family history of malignant hyperthermia    father had this   Fibromyalgia    Frequency of urination    GERD (gastroesophageal reflux disease)    H/O hiatal hernia    History of basal cell carcinoma  excision    X2   History of breast cancer ONCOLOGIST-- DR Darnelle Catalan---  NO RECURRANCE   DX 07/2012;  LOW GRADE DCIS  ER+PR+  ----  S/P RIGHT LUMPECTOMY WITH NEGATIVE MARGINS/   RADIATION ENDED 11/2012   History of chronic bronchitis    History of colonic polyps    History of tachycardia    CONTROLLED  WITH ATENOLOL   Hyperlipidemia    Hypertension    Neuromuscular disorder (HCC)    fibromyalgia   Neuropathy    Osteoporosis 01/2019   T score -2.2 stable/improved from prior study   Pelvic pain    Personal history of radiation therapy    Pneumonia    S/P radiation therapy 11/12/12 - 12/05/12   right Breast   Sepsis (HCC) 2014   from UTI    Sinus headache    Sjogren's disease (HCC)    Urgency of urination     Past Surgical History:  Procedure Laterality Date   57 HOUR PH STUDY N/A 04/03/2016   Procedure: 24 HOUR PH STUDY;  Surgeon: Ruffin Frederick, MD;  Location: [facility];  Service: Gastroenterology;  Laterality: N/A;   BREAST BIOPSY Right 08/23/2012   ADH   BREAST BIOPSY Right 10/11/2012   Ductal Carcinoma   BREAST EXCISIONAL BIOPSY Right 09/04/2013   benign   BREAST LUMPECTOMY Right 10/11/2012   W/ [facility] BX   CARDIAC CATHETERIZATION  09-13-2007  DR Riley Kill   WELL-PRESERVED LVF/  DIFFUSE SCATTERED CORONARY CALCIFACATION AND ATHEROSCLEROSIS WITHOUT OBSTRUCTION   CARDIAC CATHETERIZATION  08-04-2010  DR MCALHANY   NON-OBSTRUCTIVE CAD/  pLAD 40%/  oLAD 30%/  mLAD 30%/  pRCA 30%/  EF 60%   CARDIOVASCULAR STRESS TEST  06-18-2012  DR McALHANY   LOW RISK NUCLEAR STUDY/  SMALL FIXED AREA OF MODERATELY DECREASED UPTAKE IN  ANTEROSEPTAL WALL WHICH MAY BE ARTIFACTUAL/  NO ISCHEMIA/  EF 68%   COLONOSCOPY  09/29/2010   CYSTOSCOPY     CYSTOSCOPY WITH HYDRODISTENSION AND BIOPSY N/A 03/06/2014   Procedure: CYSTOSCOPY/HYDRODISTENSION/ INSTILATION OF MARCAINE AND PYRIDIUM;  Surgeon: Kathi Ludwig, MD;  Location: [facility] [place];  Service: Urology;  Laterality: N/A;   CYSTOSCOPY/URETEROSCOPY/HOLMIUM LASER/STENT PLACEMENT Left 03/21/2023   Procedure: CYSTOSCOPY LEFT RETROGRADE PYELOGRAM, LEFT URETEROSCOPY, HOLMIUM LASER LITHOTRIPSYM, AND LEFT URETERAL STENT PLACEMENT;  Surgeon: Rene Paci, MD;  Location: [facility];  Service: Urology;  Laterality: Left;  60 MINUTES   ESOPHAGEAL MANOMETRY N/A 04/03/2016   Procedure: ESOPHAGEAL MANOMETRY (EM);  Surgeon: Ruffin Frederick, MD;  Location: [facility];  Service: Gastroenterology;  Laterality: N/A;   EXTRACORPOREAL SHOCK WAVE LITHOTRIPSY Left 02/06/2019   Procedure: EXTRACORPOREAL SHOCK WAVE LITHOTRIPSY (ESWL);  Surgeon: Ihor Gully, MD;  Location: [facility];  Service: Urology;  Laterality: Left;   NASAL SINUS SURGERY  1985   ORIF RIGHT ANKLE  FX  2006   POLYPECTOMY     REMOVAL VOCAL CORD CYST  01/2013   RIGHT BREAST BX  08/23/2012   RIGHT HAND SURGERY  X3  LAST ONE 2009   INCLUDES  ORIF RIGHT 5TH FINGER AND REVISION TWICE   SKIN CANCER EXCISION     face,arms   TONSILLECTOMY AND ADENOIDECTOMY  AGE 84   TOTAL ABDOMINAL HYSTERECTOMY W/ BILATERAL SALPINGOOPHORECTOMY  1982   W/  APPENDECTOMY   TRANSTHORACIC ECHOCARDIOGRAM  06/24/2012   GRADE I DIASTOLIC DYSFUNCTION/  EF 55-60%/  MILD MR   UPPER GASTROINTESTINAL ENDOSCOPY      Social History   Socioeconomic History  Marital status: Married    Spouse name: Windy Fast    Number of children: 3   Years of education: BA   Highest education level: Bachelor's degree (e.g., BA, AB, BS)  Occupational History   Occupation: Retired Teacher, adult education: RETIRED  Tobacco Use   Smoking status: Former     Packs/day: 1.00    Years: 15.00    Additional pack years: 0.00    Total pack years: 15.00    Types: Cigarettes    Quit date: 05/27/2005    Years since quitting: 17.9   Smokeless tobacco: Never  Vaping Use   Vaping Use: Never used  Substance and Sexual Activity   Alcohol use: No    Alcohol/week: 0.0 standard drinks of alcohol   Drug use: No   Sexual activity: Not Currently    Partners: Male    Birth control/protection: Surgical    Comment: 1st intercourse 77 yo-Fewer than 5 partners, hysterectomy  Other Topics Concern   Not on file  Social History Narrative   Lives with husband.   Caffeine use: 1/2 cup per day   Exercise-- 2days a week [employer],  Water aerobics, walking       Retired Engineer, civil (consulting)   No dietary restrictions, tries to maintain a heart healthy diet   Social Determinants of Health   Financial Resource Strain: Low Risk  (05/01/2023)   Overall Financial Resource Strain (CARDIA)    Difficulty of Paying Living Expenses: Not hard at all  Food Insecurity: No Food Insecurity (05/01/2023)   Hunger Vital Sign    Worried About Running Out of Food in the Last Year: Never true    Ran Out of Food in the Last Year: Never true  Transportation Needs: No Transportation Needs (05/01/2023)   PRAPARE - Administrator, Civil Service (Medical): No    Lack of Transportation (Non-Medical): No  Physical Activity: Insufficiently Active (05/01/2023)   Exercise Vital Sign    Days of Exercise per Week: 1 day    Minutes of Exercise per Session: 30 min  Stress: Stress Concern Present (05/01/2023)   [employer] of Occupational Health - Occupational Stress Questionnaire    Feeling of Stress : To some extent  Social Connections: Unknown (05/01/2023)   Social Connection and Isolation Panel [NHANES]    Frequency of Communication with Friends and Family: More than three times a week    Frequency of Social Gatherings with Friends and Family: Patient declined    Attends Religious Services: Not on  Insurance claims handler of Clubs or Organizations: Yes    Attends Banker Meetings: More than 4 times per year    Marital Status: Married  Catering manager Violence: Not At Risk (03/05/2023)   Humiliation, Afraid, Rape, and Kick questionnaire    Fear of Current or Ex-Partner: No    Emotionally Abused: No    Physically Abused: No    Sexually Abused: No    Family History  Problem Relation Age of Onset   Rectal cancer Mother    Colon cancer Mother    Pancreatic cancer Mother    Diabetes Mother    Breast cancer Mother 62   Breast cancer Maternal Aunt        breast   Birth defects Maternal Aunt    Irritable bowel syndrome Son    Heart disease Son        CAD, MV replacement   Allergic Disorder Daughter    Diabetes Daughter  Colon cancer Father    Colon polyps Father    Diabetes Father    Stroke Father    Heart disease Father        CHF   Hyperlipidemia Father    Hypertension Father    Arthritis Father    Breast cancer Paternal Aunt    Breast cancer Paternal Aunt    Arthritis Paternal Uncle    Allergic Disorder Daughter    Heart disease Cousin        CAD, had a blood clot after stenting   Esophageal cancer Neg Hx    Stomach cancer Neg Hx     ROS: Some fatigue from recent URI and arthralgias but no fevers or chills, productive cough, hemoptysis, dysphasia, odynophagia, melena, hematochezia, dysuria, hematuria, rash, seizure activity, orthopnea, PND, pedal edema, claudication. Remaining systems are negative.  Physical Exam: Well-developed well-nourished in no acute distress.  Skin is warm and dry.  HEENT is normal.  Neck is supple.  Chest is clear to auscultation with normal expansion.  Cardiovascular exam is regular rate and rhythm.  Abdominal exam nontender or distended. No masses palpated. Extremities show no edema. neuro grossly intact   A/P  1 coronary artery disease-plan to continue medical therapy.  Continue aspirin.  Intolerant to statins.  She  denies chest pain or dyspnea.  2 hyperlipidemia-continue Repatha.  She is intolerant to statins.  3 hypertension-patient's blood pressure is controlled.  Continue present medical regimen.  Olga Millers, MD

## 2023-04-25 NOTE — Telephone Encounter (Signed)
Pt was advised medication has been sent Hydroxyzine 10 mg to take 2-3 x a day for the next week and should hold Loratadine and Benadryl while taking the Hydroxyzine. If the Hydroxyzine makes her too sleepy she can take one of the other two meds in morning and Hydroxyzine at night instead of Hydroxyzine tid. Also sent in Famotidine 20 mg to take twice a day for a week and then as needed after that for rash, itching . Those meds together control an allergic reaction faster. Remind pt if she gets into any distress to seek care and was advised if her breathing gets unmanageable  will need to present to ED as we are out of oral options.Pt was advise has already been referred to allergist to discuss options and She stated understand.

## 2023-04-26 ENCOUNTER — Other Ambulatory Visit: Payer: Self-pay | Admitting: Family Medicine

## 2023-04-26 DIAGNOSIS — E785 Hyperlipidemia, unspecified: Secondary | ICD-10-CM

## 2023-04-26 DIAGNOSIS — I2510 Atherosclerotic heart disease of native coronary artery without angina pectoris: Secondary | ICD-10-CM

## 2023-04-27 ENCOUNTER — Emergency Department (HOSPITAL_BASED_OUTPATIENT_CLINIC_OR_DEPARTMENT_OTHER): Payer: Medicare Other

## 2023-04-27 ENCOUNTER — Emergency Department (HOSPITAL_BASED_OUTPATIENT_CLINIC_OR_DEPARTMENT_OTHER)
Admission: EM | Admit: 2023-04-27 | Discharge: 2023-04-27 | Disposition: A | Discharge status: Home / Self Care | Payer: Medicare Other | Attending: Emergency Medicine | Admitting: Emergency Medicine

## 2023-04-27 ENCOUNTER — Encounter (HOSPITAL_BASED_OUTPATIENT_CLINIC_OR_DEPARTMENT_OTHER): Payer: Self-pay | Admitting: Urology

## 2023-04-27 ENCOUNTER — Other Ambulatory Visit: Payer: Self-pay

## 2023-04-27 DIAGNOSIS — Z79899 Other long term (current) drug therapy: Secondary | ICD-10-CM | POA: Diagnosis not present

## 2023-04-27 DIAGNOSIS — J449 Chronic obstructive pulmonary disease, unspecified: Secondary | ICD-10-CM | POA: Insufficient documentation

## 2023-04-27 DIAGNOSIS — Z7982 Long term (current) use of aspirin: Secondary | ICD-10-CM | POA: Insufficient documentation

## 2023-04-27 DIAGNOSIS — S0990XA Unspecified injury of head, initial encounter: Secondary | ICD-10-CM | POA: Diagnosis not present

## 2023-04-27 DIAGNOSIS — Z853 Personal history of malignant neoplasm of breast: Secondary | ICD-10-CM | POA: Diagnosis not present

## 2023-04-27 DIAGNOSIS — Z91040 Latex allergy status: Secondary | ICD-10-CM | POA: Insufficient documentation

## 2023-04-27 DIAGNOSIS — I10 Essential (primary) hypertension: Secondary | ICD-10-CM | POA: Insufficient documentation

## 2023-04-27 DIAGNOSIS — J0130 Acute sphenoidal sinusitis, unspecified: Secondary | ICD-10-CM | POA: Diagnosis not present

## 2023-04-27 DIAGNOSIS — R4182 Altered mental status, unspecified: Secondary | ICD-10-CM | POA: Diagnosis not present

## 2023-04-27 DIAGNOSIS — R410 Disorientation, unspecified: Secondary | ICD-10-CM | POA: Diagnosis not present

## 2023-04-27 DIAGNOSIS — J323 Chronic sphenoidal sinusitis: Secondary | ICD-10-CM

## 2023-04-27 DIAGNOSIS — R059 Cough, unspecified: Secondary | ICD-10-CM | POA: Diagnosis not present

## 2023-04-27 LAB — COMPREHENSIVE METABOLIC PANEL
ALT: 8 U/L (ref 0–44)
AST: 14 U/L — ABNORMAL LOW (ref 15–41)
Albumin: 3 g/dL — ABNORMAL LOW (ref 3.5–5.0)
Alkaline Phosphatase: 100 U/L (ref 38–126)
Anion gap: 7 (ref 5–15)
BUN: 12 mg/dL (ref 8–23)
CO2: 28 mmol/L (ref 22–32)
Calcium: 8.5 mg/dL — ABNORMAL LOW (ref 8.9–10.3)
Chloride: 101 mmol/L (ref 98–111)
Creatinine, Ser: 1.01 mg/dL — ABNORMAL HIGH (ref 0.44–1.00)
GFR, Estimated: 57 mL/min — ABNORMAL LOW (ref >60)
Glucose, Bld: 109 mg/dL — ABNORMAL HIGH (ref 70–99)
Potassium: 3.2 mmol/L — ABNORMAL LOW (ref 3.5–5.1)
Sodium: 136 mmol/L (ref 135–145)
Total Bilirubin: 0.7 mg/dL (ref 0.3–1.2)
Total Protein: 6.3 g/dL — ABNORMAL LOW (ref 6.5–8.1)

## 2023-04-27 LAB — URINALYSIS, ROUTINE W REFLEX MICROSCOPIC
Glucose, UA: NEGATIVE mg/dL
Hgb urine dipstick: NEGATIVE
Ketones, ur: NEGATIVE mg/dL
Leukocytes,Ua: NEGATIVE
Nitrite: NEGATIVE
Protein, ur: 30 mg/dL — AB
Specific Gravity, Urine: 1.015 (ref 1.005–1.030)
pH: 7.5 (ref 5.0–8.0)

## 2023-04-27 LAB — CBC WITH DIFFERENTIAL/PLATELET
Abs Immature Granulocytes: 0.08 10*3/uL — ABNORMAL HIGH (ref 0.00–0.07)
Basophils Absolute: 0.1 10*3/uL (ref 0.0–0.1)
Basophils Relative: 1 %
Eosinophils Absolute: 0.1 10*3/uL (ref 0.0–0.5)
Eosinophils Relative: 1 %
HCT: 36.2 % (ref 36.0–46.0)
Hemoglobin: 11.1 g/dL — ABNORMAL LOW (ref 12.0–15.0)
Immature Granulocytes: 1 %
Lymphocytes Relative: 12 %
Lymphs Abs: 1.3 10*3/uL (ref 0.7–4.0)
MCH: 27.4 pg (ref 26.0–34.0)
MCHC: 30.7 g/dL (ref 30.0–36.0)
MCV: 89.4 fL (ref 80.0–100.0)
Monocytes Absolute: 1.1 10*3/uL — ABNORMAL HIGH (ref 0.1–1.0)
Monocytes Relative: 11 %
Neutro Abs: 7.9 10*3/uL — ABNORMAL HIGH (ref 1.7–7.7)
Neutrophils Relative %: 74 %
Platelets: 289 10*3/uL (ref 150–400)
RBC: 4.05 MIL/uL (ref 3.87–5.11)
RDW: 16.3 % — ABNORMAL HIGH (ref 11.5–15.5)
WBC: 10.6 10*3/uL — ABNORMAL HIGH (ref 4.0–10.5)
nRBC: 0 % (ref 0.0–0.2)

## 2023-04-27 LAB — URINALYSIS, MICROSCOPIC (REFLEX): WBC, UA: NONE SEEN WBC/hpf (ref 0–5)

## 2023-04-27 LAB — CBG MONITORING, ED: Glucose-Capillary: 74 mg/dL (ref 70–99)

## 2023-04-27 LAB — MAGNESIUM: Magnesium: 1.8 mg/dL (ref 1.7–2.4)

## 2023-04-27 MED ORDER — POTASSIUM CHLORIDE CRYS ER 20 MEQ PO TBCR
40.0000 meq | EXTENDED_RELEASE_TABLET | Freq: Once | ORAL | Status: AC
  Administered 2023-04-27: 40 meq via ORAL
  Filled 2023-04-27: qty 2

## 2023-04-27 MED ORDER — CEFDINIR 300 MG PO CAPS: 300.0000 mg | ORAL_CAPSULE | Freq: Two times a day (BID) | ORAL | 0 refills | Status: AC

## 2023-04-27 MED ORDER — CEFDINIR 300 MG PO CAPS
300.0000 mg | ORAL_CAPSULE | Freq: Two times a day (BID) | ORAL | Status: DC
  Administered 2023-04-27: 300 mg via ORAL
  Filled 2023-04-27: qty 1

## 2023-04-27 NOTE — Telephone Encounter (Signed)
Pt called because she said that she still has low grade fever and her chest hurts continually and there is a sharp pain in her chest when she breathes. Pt said she hasn't felt this way before and is unsure of what to do. She had to stop the other medication due to the allergic reaction. Please call to advise what she can do to help relieve the other symptoms or what she should do for the chest pain.

## 2023-04-27 NOTE — ED Triage Notes (Signed)
Per pt family was dx with covid  4 weeks ago and bronchitis  States has been worsening since past week, +for strep at pcp 3 days ago  States cough getting worse and having some confusion per family  Not taking antibiotics due to allergic reaction (rash)  H/o sepsis

## 2023-04-27 NOTE — Telephone Encounter (Signed)
Patient called back stating she's been having trouble with her memory and feels off. She would like to get a call back today. Please advise.

## 2023-04-27 NOTE — Telephone Encounter (Signed)
Called pt back regarding chest pain and low grade fever symptom. Told pt she was advised  the other day that if  She started having any chest pain or breathing problems to go ER are call 911. Spoke with Dr.Paz the DOD regarding pt symptom And he stated Yes she should go to ER. Pt stated she understand and that her husband would take [facility].

## 2023-04-27 NOTE — Discharge Instructions (Addendum)
Follow-up with your primary care provider regarding recent symptoms and ER visit.  Today the CT scan showed that you have bilateral sinusitis and your sphenoid sinuses in your head which could be explaining your symptoms.  Your potassium was also low and we replenished it with potassium pill today.  The rest of your labs and imaging were reassuring.  You are given a dose of cefdinir here in the ER and given a prescription for it as well as you have tolerated similar medications in the past upon reviewing her chart.  If you begin to have worsening symptoms please return to ER.

## 2023-04-27 NOTE — ED Provider Notes (Signed)
[facility] Provider Note   CSN: [id number] Arrival date & time: 04/27/23  1720     History  Chief Complaint  Patient presents with   Cough   Altered Mental Status    [name] is a 77 y.o. female history of COPD, breast cancer, hypertension, anxiety, pericystic disease presented for falls and altered mental status.  Husband was present to assist history and stated that over the past few days patient has been missed remembering basic knowledge and believes it is related to her current illness.  Patient has had multiple falls in which she is hit her head but not loss consciousness.  Patient states these falls are mechanical in nature but are unwitnessed.  Patient states that last month she had bronchitis and then 3 days ago was diagnosed with strep throat however she is unable to take any antibiotic or steroid is she has these reactions to all antibiotics and steroids.   Cough Altered Mental Status      Home Medications Prior to Admission medications   Medication Sig Start Date End Date Taking? Authorizing Provider  benzonatate (TESSALON PERLES) 100 MG capsule Take 1-2 capsules (100-200 mg total) by mouth 3 (three) times daily as needed for cough. 04/16/23   Bradd Canary, MD  cefdinir (OMNICEF) 300 MG capsule Take 1 capsule (300 mg total) by mouth 2 (two) times daily for 7 days. 04/27/23 05/04/23 Yes Marl Seago, Beverly Gust, PA-C  famotidine (PEPCID) 20 MG tablet Take 1 tablet (20 mg total) by mouth 2 (two) times daily as needed for heartburn or indigestion. 04/25/23   Bradd Canary, MD  hydrOXYzine (ATARAX) 10 MG tablet Take 1 tablet (10 mg total) by mouth 3 (three) times daily as needed. 04/25/23   Bradd Canary, MD  acetaminophen (TYLENOL) 500 MG tablet Take 1,000 mg by mouth every 6 (six) hours as needed for mild pain or headache.     [provider]  albuterol (VENTOLIN HFA) 108 (90 Base) MCG/ACT inhaler Inhale 2 puffs into the  lungs every 4 (four) hours as needed. 08/18/21   Leslye Peer, MD  amLODipine (NORVASC) 5 MG tablet Take 1 tablet (5 mg total) by mouth daily. 11/01/22   Lewayne Bunting, MD  aspirin EC 81 MG tablet Take 1 tablet (81 mg total) by mouth daily. 05/28/14   Kathleene Hazel, MD  atenolol (TENORMIN) 25 MG tablet Take 50 mg by mouth 2 (two) times daily. 12/15/17   [provider]  azithromycin (ZITHROMAX) 250 MG tablet Take 2 tablets on day 1, then 1 tablet daily on days 2 through 5 04/24/23 04/29/23  Bradd Canary, MD  CALCIUM PO Take 500 mg by mouth daily.    [provider]  carboxymethylcellul-glycerin (OPTIVE) 0.5-0.9 % ophthalmic solution Place 1 drop into both eyes daily as needed for dry eyes.    [provider]  Cholecalciferol (VITAMIN D3) 50 MCG (2000 UT) CAPS Take 2,000 Units by mouth daily.    [provider]  cyanocobalamin (VITAMIN B12) 1000 MCG tablet Take 1,000 mg by mouth daily with breakfast.    [provider]  diclofenac Sodium (VOLTAREN) 1 % GEL Apply 2 g topically daily as needed (Back pain). 02/25/15   [provider]  dicyclomine (BENTYL) 10 MG capsule Take 1 capsule (10 mg total) by mouth 2 (two) times daily as needed for spasms. Patient taking differently: Take 10 mg by mouth 3 (three) times a  week. 02/14/23   Arnaldo Natal, NP  diphenhydrAMINE (BENADRYL) 25 mg capsule Take 25 mg by mouth every 6 (six) hours as needed for itching.    [provider]  Docusate Sodium (DSS) 100 MG CAPS Take 100 mg by mouth daily as needed (Constipation).    [provider]  DULoxetine (CYMBALTA) 30 MG capsule Take 1 capsule (30 mg total) by mouth daily. 08/15/22   Bradd Canary, MD  DULoxetine (CYMBALTA) 60 MG capsule Take 1 capsule by mouth twice daily 02/13/23   Bradd Canary, MD  fluticasone [facility]) 50 MCG/ACT nasal spray Use 2 spray(s) in each nostril once daily Patient taking differently: Place 2 sprays  into both nostrils daily as needed for allergies or rhinitis. 11/15/20   Bradd Canary, MD  fluticasone (FLOVENT HFA) 110 MCG/ACT inhaler Inhale 2 puffs into the lungs 2 (two) times daily. Patient taking differently: Inhale 2 puffs into the lungs daily as needed (Short of breath). 09/21/22   Parrett, Virgel Bouquet, NP  furosemide (LASIX) 20 MG tablet Take 1 tablet (20 mg total) by mouth as needed for edema. 10/19/21   Lewayne Bunting, MD  gabapentin (NEURONTIN) 300 MG capsule Take 300-900 mg by mouth See admin instructions. Take 600 mg in the morning 300 mg at lunch time and 900 mg at bedtime 09/28/22   Bradd Canary, MD  hydroxychloroquine (PLAQUENIL) 200 MG tablet Take 200 mg by mouth daily. 12/06/21   [provider]  loratadine (CLARITIN) 10 MG tablet Take 10 mg by mouth daily as needed for rhinitis.    [provider]  methylPREDNISolone (MEDROL DOSEPAK) 4 MG TBPK tablet 5 tabs po x 1 day then 4 tabs po x 1 day then 3 tabs po x 1 day then 2 tabs po x 1 day then 1 tab po x 1 day and stop 04/24/23   Bradd Canary, MD  Multiple Vitamins-Minerals (MULTIVITAMIN WITH MINERALS) tablet Take 1 tablet by mouth daily. Patient taking differently: Take 1 tablet by mouth daily. 50+ 10/29/22   Sandford Craze, NP  nitroGLYCERIN (NITROSTAT) 0.4 MG SL tablet Place 1 tablet (0.4 mg total) under the tongue every 5 (five) minutes as needed for chest pain. 04/11/22   Clayborne Dana, NP  nystatin cream (MYCOSTATIN) Apply 1 application topically 2 (two) times daily. Patient taking differently: Apply 1 application  topically daily as needed (vaginal yeast). 05/17/21   Bradd Canary, MD  pantoprazole (PROTONIX) 40 MG tablet Take 1 tablet (40 mg total) by mouth daily. 04/09/23   Sandford Craze, NP  pentosan polysulfate (ELMIRON) 100 MG capsule Take 1 capsule (100 mg total) by mouth 3 (three) times daily. Reported on 01/05/2016 Patient taking differently: Take 100 mg by mouth daily as needed (burning  and pain). Reported on 01/05/2016 02/01/17   Ok Edwards, MD  polyethylene glycol (MIRALAX) 17 g packet Take 17 g by mouth daily. Patient taking differently: Take 17 g by mouth daily as needed for mild constipation or moderate constipation. 11/10/20   Benancio Deeds, MD  REPATHA SURECLICK 140 MG/ML SOAJ INJECT 140 MG INTO THE SKIN EVERY 14 DAYS 04/26/23   Bradd Canary, MD  sucralfate (CARAFATE) 1 GM/10ML suspension Take 10 mLs (1 g total) by mouth 4 (four) times daily -  with meals and at bedtime. Patient taking differently: Take 1 g by mouth 2 (two) times daily. 08/21/22   Donato Schultz, DO  traMADol (ULTRAM) 50 MG tablet Take  25 mg by mouth 2 (two) times daily. 10/12/22   [provider]  traZODone (DESYREL) 100 MG tablet Take 1 tablet (100 mg total) by mouth at bedtime. 12/08/22   Bradd Canary, MD  Vitamin D, Ergocalciferol, (DRISDOL) 1.25 MG (50000 UNIT) CAPS capsule Take 1 capsule (50,000 Units total) by mouth every 7 (seven) days. 12x Weeks 01/23/23   Bradd Canary, MD      Allergies    Clindamycin, Clindamycin hcl, Penicillins, Rosuvastatin, Baclofen, Lincomycin, Prednisone, Clindamycin hcl, Codeine, Doxycycline, Erythromycin, Erythromycin base, Haemophilus influenzae, Haemophilus influenzae vaccines, Latex, Levofloxacin, Other, Pentazocine, Pentazocine lactate, Pneumococcal vaccine, Pneumococcal vaccine polyvalent, Tamoxifen, and Fluzone [influenza virus vaccine]    Review of Systems   Review of Systems  Respiratory:  Positive for cough.     Physical Exam Updated Vital Signs BP 123/66   Pulse 70   Temp 98.7 F (37.1 C) (Oral)   Resp 13   Ht 5\' 4"  (1.626 m)   Wt 57.5 kg   SpO2 97%   BMI 21.76 kg/m  Physical Exam  ED Results / Procedures / Treatments   Labs (all labs ordered are listed, but only abnormal results are displayed) Labs Reviewed  CBC WITH DIFFERENTIAL/PLATELET - Abnormal; Notable for the following components:      Result Value    WBC 10.6 (*)    Hemoglobin 11.1 (*)    RDW 16.3 (*)    Neutro Abs 7.9 (*)    Monocytes Absolute 1.1 (*)    Abs Immature Granulocytes 0.08 (*)    All other components within normal limits  COMPREHENSIVE METABOLIC PANEL - Abnormal; Notable for the following components:   Potassium 3.2 (*)    Glucose, Bld 109 (*)    Creatinine, Ser 1.01 (*)    Calcium 8.5 (*)    Total Protein 6.3 (*)    Albumin 3.0 (*)    AST 14 (*)    GFR, Estimated 57 (*)    All other components within normal limits  URINALYSIS, ROUTINE W REFLEX MICROSCOPIC - Abnormal; Notable for the following components:   Bilirubin Urine SMALL (*)    Protein, ur 30 (*)    All other components within normal limits  URINALYSIS, MICROSCOPIC (REFLEX) - Abnormal; Notable for the following components:   Bacteria, UA RARE (*)    All other components within normal limits  MAGNESIUM  CBG MONITORING, ED    EKG EKG Interpretation  Date/Time:  Friday Apr 27 2023 18:28:57 EDT Ventricular Rate:  71 PR Interval:  119 QRS Duration: 104 QT Interval:  418 QTC Calculation: 455 R Axis:   73 Text Interpretation: Sinus rhythm Borderline short PR interval Nonspecific ST abnormality No significant change since last tracing Confirmed by Alvino Blood ([id number]) on 04/27/2023 6:47:37 PM  Radiology DG Chest 2 View  Result Date: 04/27/2023 CLINICAL DATA:  COVID diagnosed 4 weeks ago, worsening cough and confusion EXAM: CHEST - 2 VIEW COMPARISON:  04/09/2023 FINDINGS: Frontal and lateral views of the chest demonstrate an unremarkable cardiac silhouette. No acute airspace disease, effusion, or pneumothorax. There are no acute bony abnormalities. IMPRESSION: 1. No acute intrathoracic process. Electronically Signed   By: Sharlet Salina M.D.   On: 04/27/2023 18:40   CT Head Wo Contrast  Result Date: 04/27/2023 CLINICAL DATA:  Head trauma, minor. EXAM: CT HEAD WITHOUT CONTRAST TECHNIQUE: Contiguous axial images were obtained from the base of the skull  through the vertex without intravenous contrast. RADIATION DOSE REDUCTION: This exam was performed according to  the departmental dose-optimization program which includes automated exposure control, adjustment of the mA and/or kV according to patient size and/or use of iterative reconstruction technique. COMPARISON:  Head CT 05/11/2009.  MRI brain 10/10/2017. FINDINGS: Brain: No acute intracranial hemorrhage. Patchy hypoattenuation of the cerebral white matter, most consistent with mild chronic small-vessel disease. Cortical gray-white differentiation is preserved. No hydrocephalus or extra-axial collection. No mass effect or midline shift. Vascular: No hyperdense vessel or unexpected calcification. Skull: No calvarial fracture or suspicious bone lesion. Skull base is unremarkable. Sinuses/Orbits: Near-complete opacification of the bilateral sphenoid sinuses with air-fluid levels. Orbits are unremarkable. Other: None. IMPRESSION: 1. No acute intracranial abnormality. 2. Mild chronic small-vessel disease. 3. Bilateral sphenoid sinusitis. Electronically Signed   By: Orvan Falconer M.D.   On: 04/27/2023 18:37    Procedures Procedures    Medications Ordered in ED Medications  cefdinir (OMNICEF) capsule 300 mg (300 mg Oral Given 04/27/23 2053)  potassium chloride SA (KLOR-CON M) CR tablet 40 mEq (40 mEq Oral Given 04/27/23 2052)    ED Course/ Medical Decision Making/ A&P                             Medical Decision Making Amount and/or Complexity of Data Reviewed Labs: ordered. Radiology: ordered.   [name] 77 y.o. presented today for AMS. Working DDx that I considered at this time includes, but not limited to, CVA, ICH, intracranial mass, critical dehydration, heptatic dysfunction, uremia, hypercarbia, intoxication, endocrine abnormality, toxidrome.  Review of prior external notes: 04/24/2023 office visit  Unique Tests and My Interpretation:  CT Head wo Contrast: Bilateral sphenoid  sinusitis CBC: Unremarkable CMP: Unremarkable UA: Unremarkable CXR: No acute cardiopulmonary changes Magnesium: Unremarkable EKG: Rate, rhythm, axis, intervals all examined and without medically relevant abnormality. ST segments without concerns for elevations  Discussion with Independent Historian:  Husband  Discussion of Management of Tests: None  Risk: Medium: prescription drug management  Risk Stratification Score: none  Staffed with Sche[place], MD  R/o DDx: CVA, ICH, intracranial mass, critical dehydration, heptatic dysfunction, uremia, hypercarbia, intoxication, endocrine abnormality, toxidrome: These diagnoses are not consistent with patient presentation, history, lab/imaging  Plan: Patient presented for altered mental status and falls.  On exam patient is stable vitals and was in no acute distress.  Patient had unremarkable physical exam.  Patient did vocalize multiple concerns over the past few months and states that she is unable to take any antibiotics or steroids as she will have a reaction to them.  Labs and imaging will be ordered to assess for patient's altered mental status however when I speak with her patient does appear alert and oriented x 3 and mentating well.  Patient stable at this time.  Suezanne Jacquet, MD spoke with patient and stated that we could try cefdinir as patient is tolerated Rocephin in the past.  Patient was given 1 dose of cefdinir here in the ED and given a prescription.  Patient's labs came back significant for hypokalemia 3.2.  Patient began potassium pill and a magnesium will be evaluated as well.  Patient stable at this time.  CT did show bilateral sphenoid sinusitis which could explain patient symptoms.  On reevaluation patient stated that she was relieved to have an answer for her symptoms and is willing to try cefdinir.  Patient be discharged with a prescription and given return precautions.  Patient was given return precautions. Patient stable for  discharge at this time.  Patient verbalized understanding  of plan.         Final Clinical Impression(s) / ED Diagnoses Final diagnoses:  Altered mental status, unspecified altered mental status type  Sphenoid sinusitis, unspecified chronicity    Rx / DC Orders ED Discharge Orders          Ordered    cefdinir (OMNICEF) 300 MG capsule  2 times daily        04/27/23 2040              Remi Deter 04/27/23 2101    Lonell Grandchild, MD 04/28/23 1221

## 2023-05-01 ENCOUNTER — Ambulatory Visit (INDEPENDENT_AMBULATORY_CARE_PROVIDER_SITE_OTHER): Payer: Medicare Other | Admitting: Family

## 2023-05-01 DIAGNOSIS — R634 Abnormal weight loss: Secondary | ICD-10-CM | POA: Diagnosis not present

## 2023-05-01 DIAGNOSIS — D72829 Elevated white blood cell count, unspecified: Secondary | ICD-10-CM | POA: Diagnosis not present

## 2023-05-01 DIAGNOSIS — E876 Hypokalemia: Secondary | ICD-10-CM | POA: Diagnosis not present

## 2023-05-01 DIAGNOSIS — R413 Other amnesia: Secondary | ICD-10-CM | POA: Diagnosis not present

## 2023-05-01 LAB — CBC WITH DIFFERENTIAL/PLATELET
Basophils Absolute: 0.1 10*3/uL (ref 0.0–0.1)
Basophils Relative: 1 % (ref 0.0–3.0)
Eosinophils Absolute: 0.2 10*3/uL (ref 0.0–0.7)
Eosinophils Relative: 2.4 % (ref 0.0–5.0)
HCT: 37.4 % (ref 36.0–46.0)
Hemoglobin: 12 g/dL (ref 12.0–15.0)
Lymphocytes Relative: 16.2 % (ref 12.0–46.0)
Lymphs Abs: 1.3 10*3/uL (ref 0.7–4.0)
MCHC: 32.2 g/dL (ref 30.0–36.0)
MCV: 87.3 fl (ref 78.0–100.0)
Monocytes Absolute: 0.6 10*3/uL (ref 0.1–1.0)
Monocytes Relative: 7.4 % (ref 3.0–12.0)
Neutro Abs: 5.7 10*3/uL (ref 1.4–7.7)
Neutrophils Relative %: 73 % (ref 43.0–77.0)
Platelets: 402 10*3/uL — ABNORMAL HIGH (ref 150.0–400.0)
RBC: 4.28 Mil/uL (ref 3.87–5.11)
RDW: 17.3 % — ABNORMAL HIGH (ref 11.5–15.5)
WBC: 7.8 10*3/uL (ref 4.0–10.5)

## 2023-05-01 LAB — MAGNESIUM: Magnesium: 2 mg/dL (ref 1.5–2.5)

## 2023-05-01 LAB — COMPREHENSIVE METABOLIC PANEL
ALT: 7 U/L (ref 0–35)
AST: 13 U/L (ref 0–37)
Albumin: 3.4 g/dL — ABNORMAL LOW (ref 3.5–5.2)
Alkaline Phosphatase: 110 U/L (ref 39–117)
BUN: 13 mg/dL (ref 6–23)
CO2: 28 mEq/L (ref 19–32)
Calcium: 8.8 mg/dL (ref 8.4–10.5)
Chloride: 103 mEq/L (ref 96–112)
Creatinine, Ser: 1.05 mg/dL (ref 0.40–1.20)
GFR: 51.13 mL/min — ABNORMAL LOW (ref >60.00)
Glucose, Bld: 84 mg/dL (ref 70–99)
Potassium: 4.7 mEq/L (ref 3.5–5.1)
Sodium: 138 mEq/L (ref 135–145)
Total Bilirubin: 0.4 mg/dL (ref 0.2–1.2)
Total Protein: 6.1 g/dL (ref 6.0–8.3)

## 2023-05-01 LAB — B12 AND FOLATE PANEL
Folate: 4.2 ng/mL — ABNORMAL LOW (ref >5.9)
Vitamin B-12: 497 pg/mL (ref 211–911)

## 2023-05-01 LAB — TSH: TSH: 1.46 u[IU]/mL (ref 0.35–5.50)

## 2023-05-01 NOTE — Assessment & Plan Note (Signed)
New.  Pt is advised to focus on 3 meals/day. Could be related to her sinus infection which may have been brewing for some time? Will plan to bring her back in 6 weeks for re-evaluation of her weight.

## 2023-05-01 NOTE — Assessment & Plan Note (Signed)
New. Will order labs as below to exclude underling medical cause. It could be that she has some underlying memory loss which was worsened by recent sinusitis.  Will also refer to neurology for further memory evaluation.

## 2023-05-01 NOTE — Progress Notes (Signed)
Subjective:   By signing my name below, I, [name], attest that this documentation has been prepared under the direction and in the presence of Lemont Fillers, NP 05/01/23   Patient ID: [name], female    DOB: 04/06/1946, 77 y.o.   MRN: [id number]  Chief Complaint  Patient presents with   Follow-up    Here for hospital follow up   Fatigue    Complains of fatigue   Facial Pain    Complains of sinus pain    HPI Patient is in today for an ED follow up. She was accompanied by her husband.   Memory loss: She presented to the ED on 04/27/2023 reporting multiple falls and difficulties with memory. Her husband endorses an altered mental status and states she has not been remembering conversations. Work up in the ER included a negative cxr, lab work revealing mild leukocytosis/anemia, hypokalemia (repleted in ED) and Sinusitis noted on head CT.  She was discharged home with cefdinir. Her husband notes a slight improvement in her memory since her visit to the ED however she is not at baseline. She is interested in a referral to neurology.   Sinus Infection: Sinus infection was confirmed during her ED visit. She states her throat has been hurting this morning. She reports an intolerance to Doxycycline, which caused a skin rash. She is currently compliant with 300 mg Cefdinir twice daily. She reports a persistent productive cough and chest congestion. (Negative CXR in ED). She reports lately she has been coughing up a clear mucus with salty taste.   Weight loss: She reports having lost about 15 lbs in the last 3 months without trying. Her appetite has recently decreased more than usual, though her husband notes she has always been a light eater.   Past Medical History:  Diagnosis Date   Allergy    Anemia    Anxiety    past hx    Arthritis    Asthma    Atrial flutter (HCC)    past history- not current   Breast cancer (HCC)    CAD (coronary artery disease) CARDIOLOGIST--  DR  Clifton James   mild non-obstructive cad   Cancer (HCC)    right   Cataract    bilaterally removed    Chronic constipation    Chronic kidney disease    stage 3 ckd, interstitial cystitis   Concussion    x 3   COPD (chronic obstructive pulmonary disease) (HCC)    Depression    past hx    Dyspnea    Dysrhythmia    Family history of adverse reaction to anesthesia    Family history of malignant hyperthermia    father had this   Fibromyalgia    Frequency of urination    GERD (gastroesophageal reflux disease)    H/O hiatal hernia    History of basal cell carcinoma excision    X2   History of breast cancer ONCOLOGIST-- DR Darnelle Catalan---  NO RECURRANCE   DX 07/2012;  LOW GRADE DCIS  ER+PR+  ----  S/P RIGHT LUMPECTOMY WITH NEGATIVE MARGINS/   RADIATION ENDED 11/2012   History of chronic bronchitis    History of colonic polyps    History of tachycardia    CONTROLLED  WITH ATENOLOL   Hyperlipidemia    Hypertension    Neuromuscular disorder (HCC)    fibromyalgia   Neuropathy    Osteoporosis 01/2019   T score -2.2 stable/improved from prior study   Pelvic  pain    Personal history of radiation therapy    Pneumonia    S/P radiation therapy 11/12/12 - 12/05/12   right Breast   Sepsis (HCC) 2014   from UTI    Sinus headache    Sjogren's disease (HCC)    Urgency of urination     Past Surgical History:  Procedure Laterality Date   9 HOUR PH STUDY N/A 04/03/2016   Procedure: 24 HOUR PH STUDY;  Surgeon: Ruffin Frederick, MD;  Location: [facility];  Service: Gastroenterology;  Laterality: N/A;   BREAST BIOPSY Right 08/23/2012   ADH   BREAST BIOPSY Right 10/11/2012   Ductal Carcinoma   BREAST EXCISIONAL BIOPSY Right 09/04/2013   benign   BREAST LUMPECTOMY Right 10/11/2012   W/ [facility] BX   CARDIAC CATHETERIZATION  09-13-2007  DR Riley Kill   WELL-PRESERVED LVF/  DIFFUSE SCATTERED CORONARY CALCIFACATION AND ATHEROSCLEROSIS WITHOUT OBSTRUCTION   CARDIAC CATHETERIZATION  08-04-2010  DR  MCALHANY   NON-OBSTRUCTIVE CAD/  pLAD 40%/  oLAD 30%/  mLAD 30%/  pRCA 30%/  EF 60%   CARDIOVASCULAR STRESS TEST  06-18-2012  DR McALHANY   LOW RISK NUCLEAR STUDY/  SMALL FIXED AREA OF MODERATELY DECREASED UPTAKE IN ANTEROSEPTAL WALL WHICH MAY BE ARTIFACTUAL/  NO ISCHEMIA/  EF 68%   COLONOSCOPY  09/29/2010   CYSTOSCOPY     CYSTOSCOPY WITH HYDRODISTENSION AND BIOPSY N/A 03/06/2014   Procedure: CYSTOSCOPY/HYDRODISTENSION/ INSTILATION OF MARCAINE AND PYRIDIUM;  Surgeon: Kathi Ludwig, MD;  Location: [facility];  Service: Urology;  Laterality: N/A;   CYSTOSCOPY/URETEROSCOPY/HOLMIUM LASER/STENT PLACEMENT Left 03/21/2023   Procedure: CYSTOSCOPY LEFT RETROGRADE PYELOGRAM, LEFT URETEROSCOPY, HOLMIUM LASER LITHOTRIPSYM, AND LEFT URETERAL STENT PLACEMENT;  Surgeon: Rene Paci, MD;  Location: [facility];  Service: Urology;  Laterality: Left;  60 MINUTES   ESOPHAGEAL MANOMETRY N/A 04/03/2016   Procedure: ESOPHAGEAL MANOMETRY (EM);  Surgeon: Ruffin Frederick, MD;  Location: [facility];  Service: Gastroenterology;  Laterality: N/A;   EXTRACORPOREAL SHOCK WAVE LITHOTRIPSY Left 02/06/2019   Procedure: EXTRACORPOREAL SHOCK WAVE LITHOTRIPSY (ESWL);  Surgeon: Ihor Gully, MD;  Location: [facility];  Service: Urology;  Laterality: Left;   NASAL SINUS SURGERY  1985   ORIF RIGHT ANKLE  FX  2006   POLYPECTOMY     REMOVAL VOCAL CORD CYST  01/2013   RIGHT BREAST BX  08/23/2012   RIGHT HAND SURGERY  X3  LAST ONE 2009   INCLUDES  ORIF RIGHT 5TH FINGER AND REVISION TWICE   SKIN CANCER EXCISION     face,arms   TONSILLECTOMY AND ADENOIDECTOMY  AGE 45   TOTAL ABDOMINAL HYSTERECTOMY W/ BILATERAL SALPINGOOPHORECTOMY  1982   W/  APPENDECTOMY   TRANSTHORACIC ECHOCARDIOGRAM  06/24/2012   GRADE I DIASTOLIC DYSFUNCTION/  EF 55-60%/  MILD MR   UPPER GASTROINTESTINAL ENDOSCOPY      Family History  Problem Relation Age of Onset   Rectal cancer Mother    Colon cancer Mother     Pancreatic cancer Mother    Diabetes Mother    Breast cancer Mother 55   Breast cancer Maternal Aunt        breast   Birth defects Maternal Aunt    Irritable bowel syndrome Son    Heart disease Son        CAD, MV replacement   Allergic Disorder Daughter    Diabetes Daughter    Colon cancer Father    Colon polyps Father    Diabetes Father  Stroke Father    Heart disease Father        CHF   Hyperlipidemia Father    Hypertension Father    Arthritis Father    Breast cancer Paternal Aunt    Breast cancer Paternal Aunt    Arthritis Paternal Uncle    Allergic Disorder Daughter    Heart disease Cousin        CAD, had a blood clot after stenting   Esophageal cancer Neg Hx    Stomach cancer Neg Hx     Social History   Socioeconomic History   Marital status: Married    Spouse name: Windy Fast    Number of children: 3   Years of education: BA   Highest education level: Bachelor's degree (e.g., BA, AB, BS)  Occupational History   Occupation: Retired Teacher, adult education: RETIRED  Tobacco Use   Smoking status: Former    Packs/day: 1.00    Years: 15.00    Additional pack years: 0.00    Total pack years: 15.00    Types: Cigarettes    Quit date: 05/27/2005    Years since quitting: 17.9   Smokeless tobacco: Never  Vaping Use   Vaping Use: Never used  Substance and Sexual Activity   Alcohol use: No    Alcohol/week: 0.0 standard drinks of alcohol   Drug use: No   Sexual activity: Not Currently    Partners: Male    Birth control/protection: Surgical    Comment: 1st intercourse 77 yo-Fewer than 5 partners, hysterectomy  Other Topics Concern   Not on file  Social History Narrative   Lives with husband.   Caffeine use: 1/2 cup per day   Exercise-- 2days a week [employer],  Water aerobics, walking       Retired Engineer, civil (consulting)   No dietary restrictions, tries to maintain a heart healthy diet   Social Determinants of Health   Financial Resource Strain: Low Risk  (05/01/2023)   Overall Financial  Resource Strain (CARDIA)    Difficulty of Paying Living Expenses: Not hard at all  Food Insecurity: No Food Insecurity (05/01/2023)   Hunger Vital Sign    Worried About Running Out of Food in the Last Year: Never true    Ran Out of Food in the Last Year: Never true  Transportation Needs: No Transportation Needs (05/01/2023)   PRAPARE - Administrator, Civil Service (Medical): No    Lack of Transportation (Non-Medical): No  Physical Activity: Insufficiently Active (05/01/2023)   Exercise Vital Sign    Days of Exercise per Week: 1 day    Minutes of Exercise per Session: 30 min  Stress: Stress Concern Present (05/01/2023)   [employer] of Occupational Health - Occupational Stress Questionnaire    Feeling of Stress : To some extent  Social Connections: Unknown (05/01/2023)   Social Connection and Isolation Panel [NHANES]    Frequency of Communication with Friends and Family: More than three times a week    Frequency of Social Gatherings with Friends and Family: Patient declined    Attends Religious Services: Not on Insurance claims handler of Clubs or Organizations: Yes    Attends Banker Meetings: More than 4 times per year    Marital Status: Married  Catering manager Violence: Not At Risk (03/05/2023)   Humiliation, Afraid, Rape, and Kick questionnaire    Fear of Current or Ex-Partner: No    Emotionally Abused: No    Physically Abused: No  Sexually Abused: No    Outpatient Medications Prior to Visit  Medication Sig Dispense Refill   acetaminophen (TYLENOL) 500 MG tablet Take 1,000 mg by mouth every 6 (six) hours as needed for mild pain or headache.      albuterol (VENTOLIN HFA) 108 (90 Base) MCG/ACT inhaler Inhale 2 puffs into the lungs every 4 (four) hours as needed. 1 each 0   amLODipine (NORVASC) 5 MG tablet Take 1 tablet (5 mg total) by mouth daily. 90 tablet 3   aspirin EC 81 MG tablet Take 1 tablet (81 mg total) by mouth daily. 90 tablet 3   atenolol  (TENORMIN) 25 MG tablet Take 50 mg by mouth 2 (two) times daily.     benzonatate (TESSALON PERLES) 100 MG capsule Take 1-2 capsules (100-200 mg total) by mouth 3 (three) times daily as needed for cough. 60 capsule 0   CALCIUM PO Take 500 mg by mouth daily.     carboxymethylcellul-glycerin (OPTIVE) 0.5-0.9 % ophthalmic solution Place 1 drop into both eyes daily as needed for dry eyes.     cefdinir (OMNICEF) 300 MG capsule Take 1 capsule (300 mg total) by mouth 2 (two) times daily for 7 days. 14 capsule 0   Cholecalciferol (VITAMIN D3) 50 MCG (2000 UT) CAPS Take 2,000 Units by mouth daily.     cyanocobalamin (VITAMIN B12) 1000 MCG tablet Take 1,000 mg by mouth daily with breakfast.     diclofenac Sodium (VOLTAREN) 1 % GEL Apply 2 g topically daily as needed (Back pain).     dicyclomine (BENTYL) 10 MG capsule Take 1 capsule (10 mg total) by mouth 2 (two) times daily as needed for spasms. (Patient taking differently: Take 10 mg by mouth 3 (three) times a week.) 30 capsule 0   diphenhydrAMINE (BENADRYL) 25 mg capsule Take 25 mg by mouth every 6 (six) hours as needed for itching.     Docusate Sodium (DSS) 100 MG CAPS Take 100 mg by mouth daily as needed (Constipation).     DULoxetine (CYMBALTA) 30 MG capsule Take 1 capsule (30 mg total) by mouth daily. 30 capsule 1   DULoxetine (CYMBALTA) 60 MG capsule Take 1 capsule by mouth twice daily 180 capsule 0   famotidine (PEPCID) 20 MG tablet Take 1 tablet (20 mg total) by mouth 2 (two) times daily as needed for heartburn or indigestion. 60 tablet 1   fluticasone (FLONASE) 50 MCG/ACT nasal spray Use 2 spray(s) in each nostril once daily (Patient taking differently: Place 2 sprays into both nostrils daily as needed for allergies or rhinitis.) 16 g 5   fluticasone (FLOVENT HFA) 110 MCG/ACT inhaler Inhale 2 puffs into the lungs 2 (two) times daily. (Patient taking differently: Inhale 2 puffs into the lungs daily as needed (Short of breath).) 1 each 5   furosemide  (LASIX) 20 MG tablet Take 1 tablet (20 mg total) by mouth as needed for edema. 90 tablet 3   gabapentin (NEURONTIN) 300 MG capsule Take 300-900 mg by mouth See admin instructions. Take 600 mg in the morning 300 mg at lunch time and 900 mg at bedtime     hydroxychloroquine (PLAQUENIL) 200 MG tablet Take 200 mg by mouth daily.     hydrOXYzine (ATARAX) 10 MG tablet Take 1 tablet (10 mg total) by mouth 3 (three) times daily as needed. 30 tablet 1   loratadine (CLARITIN) 10 MG tablet Take 10 mg by mouth daily as needed for rhinitis.     Multiple Vitamins-Minerals (MULTIVITAMIN WITH  MINERALS) tablet Take 1 tablet by mouth daily. (Patient taking differently: Take 1 tablet by mouth daily. 50+)     nitroGLYCERIN (NITROSTAT) 0.4 MG SL tablet Place 1 tablet (0.4 mg total) under the tongue every 5 (five) minutes as needed for chest pain. 25 tablet 1   nystatin cream (MYCOSTATIN) Apply 1 application topically 2 (two) times daily. (Patient taking differently: Apply 1 application  topically daily as needed (vaginal yeast).) 30 g 2   pantoprazole (PROTONIX) 40 MG tablet Take 1 tablet (40 mg total) by mouth daily. 90 tablet 1   pentosan polysulfate (ELMIRON) 100 MG capsule Take 1 capsule (100 mg total) by mouth 3 (three) times daily. Reported on 01/05/2016 (Patient taking differently: Take 100 mg by mouth daily as needed (burning and pain). Reported on 01/05/2016) 90 capsule 11   polyethylene glycol (MIRALAX) 17 g packet Take 17 g by mouth daily. (Patient taking differently: Take 17 g by mouth daily as needed for mild constipation or moderate constipation.) 14 each 0   REPATHA SURECLICK 140 MG/ML SOAJ INJECT 140 MG INTO THE SKIN EVERY 14 DAYS 6 mL 0   sucralfate (CARAFATE) 1 GM/10ML suspension Take 10 mLs (1 g total) by mouth 4 (four) times daily -  with meals and at bedtime. (Patient taking differently: Take 1 g by mouth 2 (two) times daily.) 420 mL 1   traMADol (ULTRAM) 50 MG tablet Take 25 mg by mouth 2 (two) times  daily.     traZODone (DESYREL) 100 MG tablet Take 1 tablet (100 mg total) by mouth at bedtime. 90 tablet 1   Vitamin D, Ergocalciferol, (DRISDOL) 1.25 MG (50000 UNIT) CAPS capsule Take 1 capsule (50,000 Units total) by mouth every 7 (seven) days. 12x Weeks 5 capsule 3   methylPREDNISolone (MEDROL DOSEPAK) 4 MG TBPK tablet 5 tabs po x 1 day then 4 tabs po x 1 day then 3 tabs po x 1 day then 2 tabs po x 1 day then 1 tab po x 1 day and stop 15 tablet 0   No facility-administered medications prior to visit.    Allergies  Allergen Reactions   Clindamycin Rash and Shortness Of Breath   Clindamycin Hcl Shortness Of Breath and Rash   Penicillins Anaphylaxis   Rosuvastatin Anaphylaxis   Baclofen Other (See Comments) and Rash   Lincomycin Other (See Comments)   Prednisone Rash    Other reaction(s): Rash, Hives - can tolerate if she takes with benadryl    Clindamycin Hcl     Other reaction(s): Shortness of Breath, Rash   Codeine Hives and Other (See Comments)    headache Other reaction(s): Headache, Nausea, headache   Doxycycline     Skin rash   Erythromycin Hives   Erythromycin Base Other (See Comments)    other   Haemophilus Influenzae Other (See Comments)    Local reaction at the site Local reaction at the site   Haemophilus Influenzae Vaccines Other (See Comments)    Local reaction at site   Latex Hives   Levofloxacin     Other reaction(s): sick   Other Other (See Comments)    Father had malignant hyperthermia   Pentazocine Other (See Comments)   Pentazocine Lactate Other (See Comments)    HALLUCINATION   Pneumococcal Vaccine Hives and Swelling    Other reaction(s): Hives, Shortness of Breath   Pneumococcal Vaccine Polyvalent Hives, Swelling and Other (See Comments)    REACTION: redness, swelling, and hives at injection site   Tamoxifen Nausea  And Vomiting and Other (See Comments)    HEADACHE Other reaction(s): Headache, Nausea   Fluzone [Influenza Virus Vaccine] Hives     Local reaction at the site    Review of Systems  Constitutional:  Positive for weight loss.  HENT:  Positive for congestion (chest) and sore throat (this morning).   Respiratory:  Positive for cough (persistent and productive) and sputum production.   Psychiatric/Behavioral:  Positive for memory loss.        Objective:    Physical Exam Constitutional:      General: She is not in acute distress.    Appearance: Normal appearance. She is well-developed.  HENT:     Head: Normocephalic and atraumatic.     Right Ear: External ear normal.     Left Ear: External ear normal.  Eyes:     General: No scleral icterus. Neck:     Thyroid: No thyromegaly.  Cardiovascular:     Rate and Rhythm: Normal rate and regular rhythm.     Heart sounds: Normal heart sounds. No murmur heard. Pulmonary:     Effort: Pulmonary effort is normal. No respiratory distress.     Breath sounds: Normal breath sounds. No wheezing.  Musculoskeletal:     Cervical back: Neck supple.  Skin:    General: Skin is warm and dry.  Neurological:     Mental Status: She is alert and oriented to person, place, and time.     Cranial Nerves: Cranial nerves 2-12 are intact.     Deep Tendon Reflexes:     Reflex Scores:      Bicep reflexes are 2+ on the right side and 2+ on the left side.      Brachioradialis reflexes are 2+ on the right side and 2+ on the left side.      Patellar reflexes are 2+ on the right side and 2+ on the left side.    Comments: Some trouble with word finding noted  Psychiatric:        Mood and Affect: Mood normal.        Behavior: Behavior normal.        Thought Content: Thought content normal.        Judgment: Judgment normal.     There were no vitals taken for this visit. Wt Readings from Last 3 Encounters:  04/27/23 126 lb 12.2 oz (57.5 kg)  04/24/23 126 lb 12.8 oz (57.5 kg)  04/09/23 127 lb (57.6 kg)       Assessment & Plan:  Leukocytosis, unspecified type -     CBC with  Differential/Platelet  Memory loss Assessment & Plan: New. Will order labs as below to exclude underling medical cause. It could be that she has some underlying memory loss which was worsened by recent sinusitis.  Will also refer to neurology for further memory evaluation.   Orders: -     TSH -     B12 and Folate Panel -     RPR -     Ambulatory referral to Neurology -     Urine Culture  Hypokalemia -     Comprehensive metabolic panel -     Magnesium  Weight loss Assessment & Plan: New.  Pt is advised to focus on 3 meals/day. Could be related to her sinus infection which may have been brewing for some time? Will plan to bring her back in 6 weeks for re-evaluation of her weight.       I,Rachel Rivera,acting as a Neurosurgeon  for Lemont Fillers, NP.,have documented all relevant documentation on the behalf of Lemont Fillers, NP,as directed by  Lemont Fillers, NP while in the presence of Lemont Fillers, NP.   I, Lemont Fillers, NP, personally preformed the services described in this documentation.  All medical record entries made by the scribe were at my direction and in my presence.  I have reviewed the chart and discharge instructions (if applicable) and agree that the record reflects my personal performance and is accurate and complete. 05/01/23   Lemont Fillers, NP

## 2023-05-02 ENCOUNTER — Ambulatory Visit: Payer: Medicare Other | Attending: Cardiology | Admitting: Cardiology

## 2023-05-02 ENCOUNTER — Telehealth: Payer: Self-pay | Admitting: Family

## 2023-05-02 ENCOUNTER — Encounter: Payer: Self-pay | Admitting: Cardiology

## 2023-05-02 VITALS — BP 114/62 | HR 70 | Ht 65.0 in | Wt 125.4 lb

## 2023-05-02 DIAGNOSIS — I10 Essential (primary) hypertension: Secondary | ICD-10-CM

## 2023-05-02 DIAGNOSIS — I2510 Atherosclerotic heart disease of native coronary artery without angina pectoris: Secondary | ICD-10-CM | POA: Diagnosis not present

## 2023-05-02 DIAGNOSIS — E785 Hyperlipidemia, unspecified: Secondary | ICD-10-CM

## 2023-05-02 LAB — URINE CULTURE
MICRO NUMBER:: 14923610
Result:: NO GROWTH
SPECIMEN QUALITY:: ADEQUATE

## 2023-05-02 LAB — RPR: RPR Ser Ql: NONREACTIVE

## 2023-05-02 MED ORDER — AMLODIPINE BESYLATE 5 MG PO TABS: 5.0000 mg | ORAL_TABLET | Freq: Every day | ORAL | 3 refills | Status: DC

## 2023-05-02 MED ORDER — ATENOLOL 25 MG PO TABS: 25.0000 mg | ORAL_TABLET | Freq: Two times a day (BID) | ORAL | 3 refills | Status: DC

## 2023-05-02 NOTE — Patient Instructions (Signed)
    Follow-Up: At [facility], you and your health needs are our priority.  As part of our continuing mission to provide you with exceptional heart care, we have created designated Provider Care Teams.  These Care Teams include your primary Cardiologist (physician) and Advanced Practice Providers (APPs -  Physician Assistants and Nurse Practitioners) who all work together to provide you with the care you need, when you need it.  We recommend signing up for the patient portal called "MyChart".  Sign up information is provided on this After Visit Summary.  MyChart is used to connect with patients for Virtual Visits (Telemedicine).  Patients are able to view lab/test results, encounter notes, upcoming appointments, etc.  Non-urgent messages can be sent to your provider as well.   To learn more about what you can do with MyChart, go to [email or web address].    Your next appointment:   12 month(s)  Provider:   Brian Crenshaw, MD     

## 2023-05-03 ENCOUNTER — Encounter: Payer: Self-pay | Admitting: Physician Assistant

## 2023-05-03 ENCOUNTER — Telehealth: Payer: Self-pay | Admitting: Family

## 2023-05-03 MED ORDER — FOLIC ACID 1 MG PO TABS: 1.0000 mg | ORAL_TABLET | Freq: Every day | ORAL | Status: AC

## 2023-05-03 NOTE — Telephone Encounter (Signed)
Neurology reached out to me about her referral.  She has previously been to [facility] and [facility] for reasons other than her memory loss. Does she wish to return to one of those offices or does she prefer to transfer her care to [place]?    Please contact husband due to pt's memory loss.

## 2023-05-03 NOTE — Telephone Encounter (Signed)
Opened in error

## 2023-05-07 ENCOUNTER — Encounter: Payer: Self-pay | Admitting: Rehabilitative and Restorative Service Providers"

## 2023-05-07 ENCOUNTER — Ambulatory Visit: Payer: Medicare Other | Attending: Family Medicine | Admitting: Rehabilitative and Restorative Service Providers"

## 2023-05-07 ENCOUNTER — Other Ambulatory Visit: Payer: Self-pay

## 2023-05-07 DIAGNOSIS — H8112 Benign paroxysmal vertigo, left ear: Secondary | ICD-10-CM | POA: Insufficient documentation

## 2023-05-07 DIAGNOSIS — R2681 Unsteadiness on feet: Secondary | ICD-10-CM | POA: Insufficient documentation

## 2023-05-07 DIAGNOSIS — M542 Cervicalgia: Secondary | ICD-10-CM | POA: Diagnosis not present

## 2023-05-07 DIAGNOSIS — M5459 Other low back pain: Secondary | ICD-10-CM | POA: Insufficient documentation

## 2023-05-07 DIAGNOSIS — R531 Weakness: Secondary | ICD-10-CM | POA: Diagnosis not present

## 2023-05-07 DIAGNOSIS — R1084 Generalized abdominal pain: Secondary | ICD-10-CM | POA: Diagnosis not present

## 2023-05-07 DIAGNOSIS — M545 Low back pain, unspecified: Secondary | ICD-10-CM | POA: Insufficient documentation

## 2023-05-07 DIAGNOSIS — G8929 Other chronic pain: Secondary | ICD-10-CM | POA: Diagnosis not present

## 2023-05-07 DIAGNOSIS — R278 Other lack of coordination: Secondary | ICD-10-CM | POA: Insufficient documentation

## 2023-05-07 DIAGNOSIS — R252 Cramp and spasm: Secondary | ICD-10-CM | POA: Insufficient documentation

## 2023-05-07 DIAGNOSIS — R42 Dizziness and giddiness: Secondary | ICD-10-CM

## 2023-05-07 DIAGNOSIS — M6281 Muscle weakness (generalized): Secondary | ICD-10-CM | POA: Diagnosis not present

## 2023-05-07 NOTE — Progress Notes (Unsigned)
New Patient Note  RE: [name] MRN: [id number] DOB: 1946/11/24 Date of Office Visit: 05/08/2023  Consult requested by: Bradd Canary, MD Primary care provider: Bradd Canary, MD  Chief Complaint: No chief complaint on file.  History of Present Illness: I had the pleasure of seeing [name] for initial evaluation at the [facility] on 05/07/2023. She is a 77 y.o. female, who is referred here by Bradd Canary, MD for the evaluation of multiple drug allergies.  Drug reaction: Reaction to *** which occurred *** ago.  Patient was being treated for ***. Symptoms started after taking ***. Symptoms include: *** Symptoms persisted for ***.  Treatment included: ***. Mucosal involvement: {Blank single:19197::"yes ***","no"}.  Denies any *** fevers, chills, changes in medications, foods, personal care products or recent infections. she has tried the following therapies: *** with *** benefit. Systemic steroids ***.  Previous work up includes: ***. Previous history of rash/hives: ***. Previous history of drug reactions: ***.  Assessment and Plan: Marelyn is a 77 y.o. female with: No problem-specific Assessment & Plan notes found for this encounter.  No follow-ups on file.  No orders of the defined types were placed in this encounter.  Lab Orders  No laboratory test(s) ordered today    Other allergy screening: Asthma: {Blank single:19197::"yes","no"} Rhino conjunctivitis: {Blank single:19197::"yes","no"} Food allergy: {Blank single:19197::"yes","no"} Medication allergy: {Blank single:19197::"yes","no"} Hymenoptera allergy: {Blank single:19197::"yes","no"} Urticaria: {Blank single:19197::"yes","no"} Eczema:{Blank single:19197::"yes","no"} History of recurrent infections suggestive of immunodeficency: {Blank single:19197::"yes","no"}  Diagnostics: Spirometry:  Tracings reviewed. Her effort: {Blank single:19197::"Good reproducible efforts.","It was  hard to get consistent efforts and there is a question as to whether this reflects a maximal maneuver.","Poor effort, data can not be interpreted."} FVC: ***L FEV1: ***L, ***% predicted FEV1/FVC ratio: ***% Interpretation: {Blank single:19197::"Spirometry consistent with mild obstructive disease","Spirometry consistent with moderate obstructive disease","Spirometry consistent with severe obstructive disease","Spirometry consistent with possible restrictive disease","Spirometry consistent with mixed obstructive and restrictive disease","Spirometry uninterpretable due to technique","Spirometry consistent with normal pattern","No overt abnormalities noted given today's efforts"}.  Please see scanned spirometry results for details.  Skin Testing: {Blank single:19197::"Select foods","Environmental allergy panel","Environmental allergy panel and select foods","Food allergy panel","None","Deferred due to recent antihistamines use"}. *** Results discussed with patient/family.   Past Medical History: Patient Active Problem List   Diagnosis Date Noted  . Weight loss 05/01/2023  . Strep pharyngitis 04/24/2023  . Common bile duct dilatation 02/13/2023  . Oral herpes 02/05/2023  . Tongue lesion 02/05/2023  . Drug reaction 02/05/2023  . Cellulitis of breast 02/05/2023  . Elevated alkaline phosphatase level 01/25/2023  . Facial trauma 01/22/2023  . Vertigo 01/22/2023  . Intertrigo 12/13/2022  . Hyperkalemia 09/28/2022  . Anemia 09/28/2022  . Tremor 07/05/2022  . RUQ pain 07/05/2022  . Leukocytosis 07/05/2022  . Chronic renal insufficiency 03/21/2022  . Sjogren's disease (HCC) 12/12/2021  . Vocal cord cyst 05/18/2021  . High serum vitamin D 05/18/2021  . Dysuria 03/09/2021  . Vitamin B12 deficiency 03/08/2021  . Preventative health care 03/08/2021  . Hyperglycemia 03/08/2021  . Low back pain 09/08/2020  . OSA and COPD overlap syndrome (HCC) 06/10/2020  . Recurrent falls 05/02/2020  . Statin  intolerance 02/29/2020  . RLS (restless legs syndrome) 11/09/2019  . Kidney stone 02/26/2019  . Recurrent sinusitis 02/25/2019  . Hx of colonic polyp 02/25/2019  . DDD (degenerative disc disease), cervical 09/17/2018  . Degenerative scoliosis 04/22/2018  . Primary insomnia 06/25/2017  . Chronic interstitial cystitis 02/01/2017  . Chronic cough 01/09/2017  .  Right arm pain 07/20/2016  . Deviated septum 05/12/2016  . Nasal turbinate hypertrophy 05/12/2016  . Dysphagia   . Allergic rhinitis 01/25/2016  . Other and combined forms of senile cataract 07/15/2015  . Dyspnea   . Left hip pain 04/30/2015  . Sciatica 04/30/2015  . Knee pain, right 04/30/2015  . Bilateral carpal tunnel syndrome 03/04/2015  . Hyperlipidemia 03/01/2015  . Pulmonary nodules 02/12/2015  . External hemorrhoids 02/05/2015  . Chronic night sweats 01/08/2015  . Atypical chest pain 01/01/2015  . Memory loss 05/22/2014  . Peripheral neuropathy 05/22/2014  . Abdominal pain 10/26/2013  . Headache 10/13/2013  . Arthralgia 08/18/2013  . ANA positive 07/29/2013  . Depression 02/05/2013  . Family history of malignant hyperthermia 02/05/2013  . Malignant neoplasm of lower-outer quadrant of right breast of female, estrogen receptor positive (HCC) 01/03/2013  . Splenic lesion 09/02/2012  . Asthma, allergic 08/05/2012  . Acute bronchitis with COPD (HCC) 06/03/2012  . GERD (gastroesophageal reflux disease) 06/03/2012  . Edema 04/08/2012  . Stricture and stenosis of esophagus 10/19/2011  . Functional constipation 07/21/2011  . Nonspecific (abnormal) findings on radiological and other examination of biliary tract 07/21/2011  . Osteoporosis 04/17/2011  . Leg pain 02/24/2011  . FIBROCYSTIC BREAST DISEASE, HX OF 10/18/2010  . Essential hypertension 08/25/2010  . CAD in native artery 08/25/2010  . TOBACCO ABUSE, HX OF 08/25/2010  . GENERALIZED ANXIETY DISORDER 08/03/2010  . Breast pain 01/11/2010  . Fibromyalgia  01/11/2010   Past Medical History:  Diagnosis Date  . Allergy   . Anemia   . Anxiety    past hx   . Arthritis   . Asthma   . Atrial flutter (HCC)    past history- not current  . Breast cancer (HCC)   . CAD (coronary artery disease) CARDIOLOGIST--  DR Clifton James   mild non-obstructive cad  . Cancer (HCC)    right  . Cataract    bilaterally removed   . Chronic constipation   . Chronic kidney disease    stage 3 ckd, interstitial cystitis  . Concussion    x 3  . COPD (chronic obstructive pulmonary disease) (HCC)   . Depression    past hx   . Dyspnea   . Dysrhythmia   . Family history of adverse reaction to anesthesia   . Family history of malignant hyperthermia    father had this  . Fibromyalgia   . Frequency of urination   . GERD (gastroesophageal reflux disease)   . H/O hiatal hernia   . History of basal cell carcinoma excision    X2  . History of breast cancer ONCOLOGIST-- DR Darnelle Catalan---  NO RECURRANCE   DX 07/2012;  LOW GRADE DCIS  ER+PR+  ----  S/P RIGHT LUMPECTOMY WITH NEGATIVE MARGINS/   RADIATION ENDED 11/2012  . History of chronic bronchitis   . History of colonic polyps   . History of tachycardia    CONTROLLED  WITH ATENOLOL  . Hyperlipidemia   . Hypertension   . Neuromuscular disorder (HCC)    fibromyalgia  . Neuropathy   . Osteoporosis 01/2019   T score -2.2 stable/improved from prior study  . Pelvic pain   . Personal history of radiation therapy   . Pneumonia   . S/P radiation therapy 11/12/12 - 12/05/12   right Breast  . Sepsis (HCC) 2014   from UTI   . Sinus headache   . Sjogren's disease (HCC)   . Urgency of urination    Past  Surgical History: Past Surgical History:  Procedure Laterality Date  . 24 HOUR PH STUDY N/A 04/03/2016   Procedure: 24 HOUR PH STUDY;  Surgeon: Ruffin Frederick, MD;  Location: [facility];  Service: Gastroenterology;  Laterality: N/A;  . BREAST BIOPSY Right 08/23/2012   ADH  . BREAST BIOPSY Right 10/11/2012    Ductal Carcinoma  . BREAST EXCISIONAL BIOPSY Right 09/04/2013   benign  . BREAST LUMPECTOMY Right 10/11/2012   W/ SLN BX  . CARDIAC CATHETERIZATION  09-13-2007  DR Riley Kill   WELL-PRESERVED LVF/  DIFFUSE SCATTERED CORONARY CALCIFACATION AND ATHEROSCLEROSIS WITHOUT OBSTRUCTION  . CARDIAC CATHETERIZATION  08-04-2010  DR Clifton James   NON-OBSTRUCTIVE CAD/  pLAD 40%/  oLAD 30%/  mLAD 30%/  pRCA 30%/  EF 60%  . CARDIOVASCULAR STRESS TEST  06-18-2012  DR McALHANY   LOW RISK NUCLEAR STUDY/  SMALL FIXED AREA OF MODERATELY DECREASED UPTAKE IN ANTEROSEPTAL WALL WHICH MAY BE ARTIFACTUAL/  NO ISCHEMIA/  EF 68%  . COLONOSCOPY  09/29/2010  . CYSTOSCOPY    . CYSTOSCOPY WITH HYDRODISTENSION AND BIOPSY N/A 03/06/2014   Procedure: CYSTOSCOPY/HYDRODISTENSION/ INSTILATION OF MARCAINE AND PYRIDIUM;  Surgeon: Kathi Ludwig, MD;  Location: [facility];  Service: Urology;  Laterality: N/A;  . CYSTOSCOPY/URETEROSCOPY/HOLMIUM LASER/STENT PLACEMENT Left 03/21/2023   Procedure: CYSTOSCOPY LEFT RETROGRADE PYELOGRAM, LEFT URETEROSCOPY, HOLMIUM LASER LITHOTRIPSYM, AND LEFT URETERAL STENT PLACEMENT;  Surgeon: Rene Paci, MD;  Location: [facility];  Service: Urology;  Laterality: Left;  60 MINUTES  . ESOPHAGEAL MANOMETRY N/A 04/03/2016   Procedure: ESOPHAGEAL MANOMETRY (EM);  Surgeon: Ruffin Frederick, MD;  Location: [facility];  Service: Gastroenterology;  Laterality: N/A;  . EXTRACORPOREAL SHOCK WAVE LITHOTRIPSY Left 02/06/2019   Procedure: EXTRACORPOREAL SHOCK WAVE LITHOTRIPSY (ESWL);  Surgeon: Ihor Gully, MD;  Location: [facility];  Service: Urology;  Laterality: Left;  . NASAL SINUS SURGERY  1985  . ORIF RIGHT ANKLE  FX  2006  . POLYPECTOMY    . REMOVAL VOCAL CORD CYST  01/2013  . RIGHT BREAST BX  08/23/2012  . RIGHT HAND SURGERY  X3  LAST ONE 2009   INCLUDES  ORIF RIGHT 5TH FINGER AND REVISION TWICE  . SKIN CANCER EXCISION     face,arms  . TONSILLECTOMY AND ADENOIDECTOMY  AGE 54   . TOTAL ABDOMINAL HYSTERECTOMY W/ BILATERAL SALPINGOOPHORECTOMY  1982   W/  APPENDECTOMY  . TRANSTHORACIC ECHOCARDIOGRAM  06/24/2012   GRADE I DIASTOLIC DYSFUNCTION/  EF 55-60%/  MILD MR  . UPPER GASTROINTESTINAL ENDOSCOPY     Medication List:  Current Outpatient Medications  Medication Sig Dispense Refill  . acetaminophen (TYLENOL) 500 MG tablet Take 1,000 mg by mouth every 6 (six) hours as needed for mild pain or headache.     . albuterol (VENTOLIN HFA) 108 (90 Base) MCG/ACT inhaler Inhale 2 puffs into the lungs every 4 (four) hours as needed. 1 each 0  . amLODipine (NORVASC) 5 MG tablet Take 1 tablet (5 mg total) by mouth daily. 90 tablet 3  . aspirin EC 81 MG tablet Take 1 tablet (81 mg total) by mouth daily. 90 tablet 3  . atenolol (TENORMIN) 25 MG tablet Take 1 tablet (25 mg total) by mouth 2 (two) times daily. Take 1 tablet by mouth 2 time a day 180 tablet 3  . benzonatate (TESSALON PERLES) 100 MG capsule Take 1-2 capsules (100-200 mg total) by mouth 3 (three) times daily as needed for cough. 60 capsule 0  . CALCIUM PO Take 500 mg  by mouth daily.    . carboxymethylcellul-glycerin (OPTIVE) 0.5-0.9 % ophthalmic solution Place 1 drop into both eyes daily as needed for dry eyes.    . Cholecalciferol (VITAMIN D3) 50 MCG (2000 UT) CAPS Take 2,000 Units by mouth daily.    . cyanocobalamin (VITAMIN B12) 1000 MCG tablet Take 1,000 mg by mouth daily with breakfast.    . diclofenac Sodium (VOLTAREN) 1 % GEL Apply 2 g topically daily as needed (Back pain).    Marland Kitchen dicyclomine (BENTYL) 10 MG capsule Take 1 capsule (10 mg total) by mouth 2 (two) times daily as needed for spasms. (Patient taking differently: Take 10 mg by mouth 3 (three) times a week.) 30 capsule 0  . diphenhydrAMINE (BENADRYL) 25 mg capsule Take 25 mg by mouth every 6 (six) hours as needed for itching.    Tery Sanfilippo Sodium (DSS) 100 MG CAPS Take 100 mg by mouth daily as needed (Constipation).    . DULoxetine (CYMBALTA) 30 MG capsule  Take 1 capsule (30 mg total) by mouth daily. 30 capsule 1  . DULoxetine (CYMBALTA) 60 MG capsule Take 1 capsule by mouth twice daily 180 capsule 0  . famotidine (PEPCID) 20 MG tablet Take 1 tablet (20 mg total) by mouth 2 (two) times daily as needed for heartburn or indigestion. 60 tablet 1  . fluticasone (FLONASE) 50 MCG/ACT nasal spray Use 2 spray(s) in each nostril once daily (Patient taking differently: Place 2 sprays into both nostrils daily as needed for allergies or rhinitis.) 16 g 5  . fluticasone (FLOVENT HFA) 110 MCG/ACT inhaler Inhale 2 puffs into the lungs 2 (two) times daily. (Patient taking differently: Inhale 2 puffs into the lungs daily as needed (Short of breath).) 1 each 5  . folic acid (FOLVITE) 1 MG tablet Take 1 tablet (1 mg total) by mouth daily.    . furosemide (LASIX) 20 MG tablet Take 1 tablet (20 mg total) by mouth as needed for edema. 90 tablet 3  . gabapentin (NEURONTIN) 300 MG capsule Take 300-900 mg by mouth See admin instructions. Take 600 mg in the morning 300 mg at lunch time and 900 mg at bedtime    . hydroxychloroquine (PLAQUENIL) 200 MG tablet Take 200 mg by mouth daily.    . hydrOXYzine (ATARAX) 10 MG tablet Take 1 tablet (10 mg total) by mouth 3 (three) times daily as needed. 30 tablet 1  . loratadine (CLARITIN) 10 MG tablet Take 10 mg by mouth daily as needed for rhinitis.    . Multiple Vitamins-Minerals (MULTIVITAMIN WITH MINERALS) tablet Take 1 tablet by mouth daily. (Patient taking differently: Take 1 tablet by mouth daily. 50+)    . nitroGLYCERIN (NITROSTAT) 0.4 MG SL tablet Place 1 tablet (0.4 mg total) under the tongue every 5 (five) minutes as needed for chest pain. 25 tablet 1  . nystatin cream (MYCOSTATIN) Apply 1 application topically 2 (two) times daily. (Patient taking differently: Apply 1 application  topically daily as needed (vaginal yeast).) 30 g 2  . pantoprazole (PROTONIX) 40 MG tablet Take 1 tablet (40 mg total) by mouth daily. 90 tablet 1  .  pentosan polysulfate (ELMIRON) 100 MG capsule Take 1 capsule (100 mg total) by mouth 3 (three) times daily. Reported on 01/05/2016 (Patient taking differently: Take 100 mg by mouth daily as needed (burning and pain). Reported on 01/05/2016) 90 capsule 11  . polyethylene glycol (MIRALAX) 17 g packet Take 17 g by mouth daily. (Patient taking differently: Take 17 g by mouth daily  as needed for mild constipation or moderate constipation.) 14 each 0  . REPATHA SURECLICK 140 MG/ML SOAJ INJECT 140 MG INTO THE SKIN EVERY 14 DAYS 6 mL 0  . sucralfate (CARAFATE) 1 GM/10ML suspension Take 10 mLs (1 g total) by mouth 4 (four) times daily -  with meals and at bedtime. (Patient taking differently: Take 1 g by mouth 2 (two) times daily.) 420 mL 1  . traMADol (ULTRAM) 50 MG tablet Take 25 mg by mouth 2 (two) times daily.    . traZODone (DESYREL) 100 MG tablet Take 1 tablet (100 mg total) by mouth at bedtime. 90 tablet 1  . Vitamin D, Ergocalciferol, (DRISDOL) 1.25 MG (50000 UNIT) CAPS capsule Take 1 capsule (50,000 Units total) by mouth every 7 (seven) days. 12x Weeks 5 capsule 3   No current facility-administered medications for this visit.   Allergies: Allergies  Allergen Reactions  . Clindamycin Rash and Shortness Of Breath  . Clindamycin Hcl Shortness Of Breath and Rash  . Penicillins Anaphylaxis  . Rosuvastatin Anaphylaxis  . Baclofen Other (See Comments) and Rash  . Lincomycin Other (See Comments)  . Prednisone Rash    Other reaction(s): Rash, Hives - can tolerate if she takes with benadryl   . Clindamycin Hcl     Other reaction(s): Shortness of Breath, Rash  . Codeine Hives and Other (See Comments)    headache Other reaction(s): Headache, Nausea, headache  . Doxycycline     Skin rash  . Erythromycin Hives  . Erythromycin Base Other (See Comments)    other  . Haemophilus Influenzae Other (See Comments)    Local reaction at the site Local reaction at the site  . Haemophilus Influenzae Vaccines  Other (See Comments)    Local reaction at site  . Latex Hives  . Levofloxacin     Other reaction(s): sick  . Other Other (See Comments)    Father had malignant hyperthermia  . Pentazocine Other (See Comments)  . Pentazocine Lactate Other (See Comments)    HALLUCINATION  . Pneumococcal Vaccine Hives and Swelling    Other reaction(s): Hives, Shortness of Breath  . Pneumococcal Vaccine Polyvalent Hives, Swelling and Other (See Comments)    REACTION: redness, swelling, and hives at injection site  . Tamoxifen Nausea And Vomiting and Other (See Comments)    HEADACHE Other reaction(s): Headache, Nausea  . Fluzone [Influenza Virus Vaccine] Hives    Local reaction at the site   Social History: Social History   Socioeconomic History  . Marital status: Married    Spouse name: Windy Fast   . Number of children: 3  . Years of education: BA  . Highest education level: Bachelor's degree (e.g., BA, AB, BS)  Occupational History  . Occupation: Retired Teacher, adult education: RETIRED  Tobacco Use  . Smoking status: Former    Packs/day: 1.00    Years: 15.00    Additional pack years: 0.00    Total pack years: 15.00    Types: Cigarettes    Quit date: 05/27/2005    Years since quitting: 17.9  . Smokeless tobacco: Never  Vaping Use  . Vaping Use: Never used  Substance and Sexual Activity  . Alcohol use: No    Alcohol/week: 0.0 standard drinks of alcohol  . Drug use: No  . Sexual activity: Not Currently    Partners: Male    Birth control/protection: Surgical    Comment: 1st intercourse 77 yo-Fewer than 5 partners, hysterectomy  Other Topics Concern  .  Not on file  Social History Narrative   Lives with husband.   Caffeine use: 1/2 cup per day   Exercise-- 2days a week [employer],  Water aerobics, walking       Retired Engineer, civil (consulting)   No dietary restrictions, tries to maintain a heart healthy diet   Social Determinants of Health   Financial Resource Strain: Low Risk  (05/01/2023)   Overall Financial  Resource Strain (CARDIA)   . Difficulty of Paying Living Expenses: Not hard at all  Food Insecurity: No Food Insecurity (05/01/2023)   Hunger Vital Sign   . Worried About Programme researcher, broadcasting/film/video in the Last Year: Never true   . Ran Out of Food in the Last Year: Never true  Transportation Needs: No Transportation Needs (05/01/2023)   PRAPARE - Transportation   . Lack of Transportation (Medical): No   . Lack of Transportation (Non-Medical): No  Physical Activity: Insufficiently Active (05/01/2023)   Exercise Vital Sign   . Days of Exercise per Week: 1 day   . Minutes of Exercise per Session: 30 min  Stress: Stress Concern Present (05/01/2023)   [employer] of Occupational Health - Occupational Stress Questionnaire   . Feeling of Stress : To some extent  Social Connections: Unknown (05/01/2023)   Social Connection and Isolation Panel [NHANES]   . Frequency of Communication with Friends and Family: More than three times a week   . Frequency of Social Gatherings with Friends and Family: Patient declined   . Attends Religious Services: Not on file   . Active Member of Clubs or Organizations: Yes   . Attends Banker Meetings: More than 4 times per year   . Marital Status: Married   Lives in a ***. Smoking: *** Occupation: ***  Environmental HistorySurveyor, minerals in the house: Copywriter, advertising in the family room: {Blank single:19197::"yes","no"} Carpet in the bedroom: {Blank single:19197::"yes","no"} Heating: {Blank single:19197::"electric","gas","heat pump"} Cooling: {Blank single:19197::"central","window","heat pump"} Pet: {Blank single:19197::"yes ***","no"}  Family History: Family History  Problem Relation Age of Onset  . Rectal cancer Mother   . Colon cancer Mother   . Pancreatic cancer Mother   . Diabetes Mother   . Breast cancer Mother 40  . Breast cancer Maternal Aunt        breast  . Birth defects Maternal Aunt   . Irritable bowel  syndrome Son   . Heart disease Son        CAD, MV replacement  . Allergic Disorder Daughter   . Diabetes Daughter   . Colon cancer Father   . Colon polyps Father   . Diabetes Father   . Stroke Father   . Heart disease Father        CHF  . Hyperlipidemia Father   . Hypertension Father   . Arthritis Father   . Breast cancer Paternal Aunt   . Breast cancer Paternal Aunt   . Arthritis Paternal Uncle   . Allergic Disorder Daughter   . Heart disease Cousin        CAD, had a blood clot after stenting  . Esophageal cancer Neg Hx   . Stomach cancer Neg Hx    Problem                               Relation Asthma                                   ***  Eczema                                *** Food allergy                          *** Allergic rhino conjunctivitis     ***  Review of Systems  Constitutional:  Negative for appetite change, chills, fever and unexpected weight change.  HENT:  Negative for congestion and rhinorrhea.   Eyes:  Negative for itching.  Respiratory:  Negative for cough, chest tightness, shortness of breath and wheezing.   Cardiovascular:  Negative for chest pain.  Gastrointestinal:  Negative for abdominal pain.  Genitourinary:  Negative for difficulty urinating.  Skin:  Negative for rash.  Neurological:  Negative for headaches.   Objective: There were no vitals taken for this visit. There is no height or weight on file to calculate BMI. Physical Exam Vitals and nursing note reviewed.  Constitutional:      Appearance: Normal appearance. She is well-developed.  HENT:     Head: Normocephalic and atraumatic.     Right Ear: Tympanic membrane and external ear normal.     Left Ear: Tympanic membrane and external ear normal.     Nose: Nose normal.     Mouth/Throat:     Mouth: Mucous membranes are moist.     Pharynx: Oropharynx is clear.  Eyes:     Conjunctiva/sclera: Conjunctivae normal.  Cardiovascular:     Rate and Rhythm: Normal rate and regular rhythm.      Heart sounds: Normal heart sounds. No murmur heard.    No friction rub. No gallop.  Pulmonary:     Effort: Pulmonary effort is normal.     Breath sounds: Normal breath sounds. No wheezing, rhonchi or rales.  Musculoskeletal:     Cervical back: Neck supple.  Skin:    General: Skin is warm.     Findings: No rash.  Neurological:     Mental Status: She is alert and oriented to person, place, and time.  Psychiatric:        Behavior: Behavior normal.  The plan was reviewed with the patient/family, and all questions/concerned were addressed.  It was my pleasure to see Stevi today and participate in her care. Please feel free to contact me with any questions or concerns.  Sincerely,  Wyline Mood, DO Allergy & Immunology  [facility] office: [phone] [facility] office: [phone]

## 2023-05-07 NOTE — Therapy (Unsigned)
OUTPATIENT PHYSICAL THERAPY NEURO EVALUATION   Patient Name: [name] MRN: [id number] DOB:08-Jun-1946, 77 y.o., female Today's Date: 05/07/2023  PCP: Danise Edge, MD REFERRING PROVIDER: Danise Edge, MD  END OF SESSION:  PT End of Session - 05/07/23 1110     Visit Number 1    Number of Visits 16    Date for PT Re-Evaluation 07/05/23    Authorization Type [employer] medicare    Progress Note Due on Visit 10    PT Start Time 1108    PT Stop Time 1148    PT Time Calculation (min) 40 min    Activity Tolerance Patient tolerated treatment well    Behavior During Therapy WFL for tasks assessed/performed             Past Medical History:  Diagnosis Date   Allergy    Anemia    Anxiety    past hx    Arthritis    Asthma    Atrial flutter (HCC)    past history- not current   Breast cancer (HCC)    CAD (coronary artery disease) CARDIOLOGIST--  DR Clifton James   mild non-obstructive cad   Cancer (HCC)    right   Cataract    bilaterally removed    Chronic constipation    Chronic kidney disease    stage 3 ckd, interstitial cystitis   Concussion    x 3   COPD (chronic obstructive pulmonary disease) (HCC)    Depression    past hx    Dyspnea    Dysrhythmia    Family history of adverse reaction to anesthesia    Family history of malignant hyperthermia    father had this   Fibromyalgia    Frequency of urination    GERD (gastroesophageal reflux disease)    H/O hiatal hernia    History of basal cell carcinoma excision    X2   History of breast cancer ONCOLOGIST-- DR Darnelle Catalan---  NO RECURRANCE   DX 07/2012;  LOW GRADE DCIS  ER+PR+  ----  S/P RIGHT LUMPECTOMY WITH NEGATIVE MARGINS/   RADIATION ENDED 11/2012   History of chronic bronchitis    History of colonic polyps    History of tachycardia    CONTROLLED  WITH ATENOLOL   Hyperlipidemia    Hypertension    Neuromuscular disorder (HCC)    fibromyalgia   Neuropathy    Osteoporosis 01/2019   T score -2.2 stable/improved  from prior study   Pelvic pain    Personal history of radiation therapy    Pneumonia    S/P radiation therapy 11/12/12 - 12/05/12   right Breast   Sepsis (HCC) 2014   from UTI    Sinus headache    Sjogren's disease (HCC)    Urgency of urination    Past Surgical History:  Procedure Laterality Date   14 HOUR PH STUDY N/A 04/03/2016   Procedure: 24 HOUR PH STUDY;  Surgeon: Ruffin Frederick, MD;  Location: [facility];  Service: Gastroenterology;  Laterality: N/A;   BREAST BIOPSY Right 08/23/2012   ADH   BREAST BIOPSY Right 10/11/2012   Ductal Carcinoma   BREAST EXCISIONAL BIOPSY Right 09/04/2013   benign   BREAST LUMPECTOMY Right 10/11/2012   W/ [facility] BX   CARDIAC CATHETERIZATION  09-13-2007  DR Riley Kill   WELL-PRESERVED LVF/  DIFFUSE SCATTERED CORONARY CALCIFACATION AND ATHEROSCLEROSIS WITHOUT OBSTRUCTION   CARDIAC CATHETERIZATION  08-04-2010  DR Clifton James   NON-OBSTRUCTIVE CAD/  pLAD 40%/  oLAD 30%/  mLAD 30%/  pRCA 30%/  EF 60%   CARDIOVASCULAR STRESS TEST  06-18-2012  DR McALHANY   LOW RISK NUCLEAR STUDY/  SMALL FIXED AREA OF MODERATELY DECREASED UPTAKE IN ANTEROSEPTAL WALL WHICH MAY BE ARTIFACTUAL/  NO ISCHEMIA/  EF 68%   COLONOSCOPY  09/29/2010   CYSTOSCOPY     CYSTOSCOPY WITH HYDRODISTENSION AND BIOPSY N/A 03/06/2014   Procedure: CYSTOSCOPY/HYDRODISTENSION/ INSTILATION OF MARCAINE AND PYRIDIUM;  Surgeon: Kathi Ludwig, MD;  Location: [facility];  Service: Urology;  Laterality: N/A;   CYSTOSCOPY/URETEROSCOPY/HOLMIUM LASER/STENT PLACEMENT Left 03/21/2023   Procedure: CYSTOSCOPY LEFT RETROGRADE PYELOGRAM, LEFT URETEROSCOPY, HOLMIUM LASER LITHOTRIPSYM, AND LEFT URETERAL STENT PLACEMENT;  Surgeon: Rene Paci, MD;  Location: [facility];  Service: Urology;  Laterality: Left;  60 MINUTES   ESOPHAGEAL MANOMETRY N/A 04/03/2016   Procedure: ESOPHAGEAL MANOMETRY (EM);  Surgeon: Ruffin Frederick, MD;  Location: [facility];  Service:  Gastroenterology;  Laterality: N/A;   EXTRACORPOREAL SHOCK WAVE LITHOTRIPSY Left 02/06/2019   Procedure: EXTRACORPOREAL SHOCK WAVE LITHOTRIPSY (ESWL);  Surgeon: Ihor Gully, MD;  Location: [facility];  Service: Urology;  Laterality: Left;   NASAL SINUS SURGERY  1985   ORIF RIGHT ANKLE  FX  2006   POLYPECTOMY     REMOVAL VOCAL CORD CYST  01/2013   RIGHT BREAST BX  08/23/2012   RIGHT HAND SURGERY  X3  LAST ONE 2009   INCLUDES  ORIF RIGHT 5TH FINGER AND REVISION TWICE   SKIN CANCER EXCISION     face,arms   TONSILLECTOMY AND ADENOIDECTOMY  AGE 38   TOTAL ABDOMINAL HYSTERECTOMY W/ BILATERAL SALPINGOOPHORECTOMY  1982   W/  APPENDECTOMY   TRANSTHORACIC ECHOCARDIOGRAM  06/24/2012   GRADE I DIASTOLIC DYSFUNCTION/  EF 55-60%/  MILD MR   UPPER GASTROINTESTINAL ENDOSCOPY     Patient Active Problem List   Diagnosis Date Noted   Weight loss 05/01/2023   Strep pharyngitis 04/24/2023   Common bile duct dilatation 02/13/2023   Oral herpes 02/05/2023   Tongue lesion 02/05/2023   Drug reaction 02/05/2023   Cellulitis of breast 02/05/2023   Elevated alkaline phosphatase level 01/25/2023   Facial trauma 01/22/2023   Vertigo 01/22/2023   Intertrigo 12/13/2022   Hyperkalemia 09/28/2022   Anemia 09/28/2022   Tremor 07/05/2022   RUQ pain 07/05/2022   Leukocytosis 07/05/2022   Chronic renal insufficiency 03/21/2022   Sjogren's disease (HCC) 12/12/2021   Vocal cord cyst 05/18/2021   High serum vitamin D 05/18/2021   Dysuria 03/09/2021   Vitamin B12 deficiency 03/08/2021   Preventative health care 03/08/2021   Hyperglycemia 03/08/2021   Low back pain 09/08/2020   OSA and COPD overlap syndrome (HCC) 06/10/2020   Recurrent falls 05/02/2020   Statin intolerance 02/29/2020   RLS (restless legs syndrome) 11/09/2019   Kidney stone 02/26/2019   Recurrent sinusitis 02/25/2019   Hx of colonic polyp 02/25/2019   DDD (degenerative disc disease), cervical 09/17/2018   Degenerative scoliosis 04/22/2018    Primary insomnia 06/25/2017   Chronic interstitial cystitis 02/01/2017   Chronic cough 01/09/2017   Right arm pain 07/20/2016   Deviated septum 05/12/2016   Nasal turbinate hypertrophy 05/12/2016   Dysphagia    Allergic rhinitis 01/25/2016   Other and combined forms of senile cataract 07/15/2015   Dyspnea    Left hip pain 04/30/2015   Sciatica 04/30/2015   Knee pain, right 04/30/2015   Bilateral carpal tunnel syndrome 03/04/2015   Hyperlipidemia 03/01/2015   Pulmonary nodules 02/12/2015   External hemorrhoids  02/05/2015   Chronic night sweats 01/08/2015   Atypical chest pain 01/01/2015   Memory loss 05/22/2014   Peripheral neuropathy 05/22/2014   Abdominal pain 10/26/2013   Headache 10/13/2013   Arthralgia 08/18/2013   ANA positive 07/29/2013   Depression 02/05/2013   Family history of malignant hyperthermia 02/05/2013   Malignant neoplasm of lower-outer quadrant of right breast of female, estrogen receptor positive (HCC) 01/03/2013   Splenic lesion 09/02/2012   Asthma, allergic 08/05/2012   Acute bronchitis with COPD (HCC) 06/03/2012   GERD (gastroesophageal reflux disease) 06/03/2012   Edema 04/08/2012   Stricture and stenosis of esophagus 10/19/2011   Functional constipation 07/21/2011   Nonspecific (abnormal) findings on radiological and other examination of biliary tract 07/21/2011   Osteoporosis 04/17/2011   Leg pain 02/24/2011   FIBROCYSTIC BREAST DISEASE, HX OF 10/18/2010   Essential hypertension 08/25/2010   CAD in native artery 08/25/2010   TOBACCO ABUSE, HX OF 08/25/2010   GENERALIZED ANXIETY DISORDER 08/03/2010   Breast pain 01/11/2010   Fibromyalgia 01/11/2010    ONSET DATE: 04/24/23 REFERRING DIAG:  R42 (ICD-10-CM) - Dizzy spells  R53.1 (ICD-10-CM) - Weakness   THERAPY DIAG:  Dizziness and giddiness  Unsteadiness on feet  BPPV (benign paroxysmal positional vertigo), left  Rationale for Evaluation and Treatment: Rehabilitation  SUBJECTIVE:                                                                                                                                                                                            SUBJECTIVE STATEMENT: The patient notices that a year and a half ago, she had spinning vertigo with getting up in the morning. She notes running into walls, doors, due to imbalance. She also has back pain that limits how she moves. She reports multiple falls, more frequently when outdoors on concrete.  Pt accompanied by: self PERTINENT HISTORY: chronic back pain, (scheduled for back ablation in June 2024), h/o vertigo PAIN:  Are you having pain? Yes: NPRS scale: regular pain in low back/10 Pain location: low back  Pain description: chronic Aggravating factors: PT will monitor Relieving factors: *has upcoming intervention scheduled PRECAUTIONS: Fall WEIGHT BEARING RESTRICTIONS: No FALLS: Has patient fallen in last 6 months? Yes. Number of falls 1.  LIVING ENVIRONMENT: Lives with: lives with their spouse Lives in: House/apartment Stairs: One step with one rail PLOF: Independent with basic ADLs-- notes declining mobility.  PATIENT GOALS: "stop the spinning and be able to walk and not falter."  OBJECTIVE:   COGNITION: Overall cognitive status:  Patient mentions memory changes since first fall where she hit her head -- she is unable to recall  date   SENSATION:  Patient reports numbness and cold sensations in feet.  POSTURE:  WFLs  LOWER EXTREMITY ROM:   WFLs   LOWER EXTREMITY MMT:    MMT Right Eval Left Eval  Hip flexion 3+/5 4/5  Knee flexion 4/5 4/5  Knee extension 5/5 5/5  Ankle dorsiflexion 3+/5 3+/5  Ankle plantarflexion    Ankle inversion    Ankle eversion    (Blank rows = not tested)  BED MOBILITY:  Sit to supine Complete Independence Supine to sit Complete Independence Rolling to Right Complete Independence Rolling to Left Complete Independence *notes "I'm in a fog" with returning to  sitting  GAIT: Gait pattern:  slowed pace with SPC and wide BOS Distance walked: 100 ft Assistive device utilized: Single point cane  FUNCTIONAL TESTS:  Berg= TBA 5 time sit to stand=20.46 seconds with Ues on legs 27M=0.64 m/sec or 2.10 ft/sec  PATIENT SURVEYS:  FOTO n/a--not measured  due to vertigo/balance  VESTIBULAR ASSESSMENT:  GENERAL OBSERVATION: Gets intermittent dizziness   SYMPTOM BEHAVIOR:  Subjective history: Has intermittent h/o vertigo  Non-Vestibular symptoms:  none  Type of dizziness: Spinning/Vertigo, Unsteady with head/body turns, and Lightheadedness/Faint  Frequency: daily   Duration: seconds to minutes  Aggravating factors: Worse in the morning and with getting up after sitting for longer periods  Relieving factors: unsure  Progression of symptoms: unchanged  OCULOMOTOR EXAM:  Ocular Alignment: normal  Ocular ROM: No Limitations  Spontaneous Nystagmus: absent  Gaze-Induced Nystagmus: absent  Smooth Pursuits: intact but notes target seems "out of reach"   Saccades: intact   VESTIBULAR - OCULAR REFLEX:   Slow VOR: Comment: 2 reps provokes a sensation of bad spinning  Head-Impulse Test:  Did not test today due to inability to tolerate slow VOR  Dynamic Visual Acuity: to be assessed   POSITIONAL TESTING: Right Dix-Hallpike: no nystagmus Left Dix-Hallpike: no nystagmus Right Roll Test: TBA Left Roll Test: TBA Right Sidelying: no nystagmus Left Sidelying: no nystagmus *Notes symptoms with return to sitting.   PATIENT EDUCATION: Education details: goals of physical therapy Person educated: Patient Education method: Explanation Education comprehension: verbalized understanding  HOME EXERCISE PROGRAM: To be established at next visit  GOALS: Goals reviewed with patient? Yes  SHORT TERM GOALS: Target date: 06/06/23  Patient will be independent with initial HEP.  Baseline: To be given Goal status: INITIAL  2.  Patient will complete Berg, and  FGA if indicated. Baseline: not done at eval Goal status: INITIAL   3.  Patient will report decreased dizziness by 50%. Baseline: not finished.  Goal status: INITIAL  LONG TERM GOALS:  Target date: 07/05/23  Patient will be independent with advanced/ongoing HEP to improve outcomes and carryover.  Baseline: to be established. Goal status: INITIAL  2.  Patient will improve Berg balance score by 5 points to demo improved functional balance.  Baseline: to be assessed  Goal status: INITIAL  3. Patient will demo floor<>stand with UE support mod indep for improved functional strength.  Baseline: has h/o falls.  Goal status: INITIAL  4. Patient will improve gait speed to 2.8 ft/sec to demo improving mobility and dec'd risk for falls.  Baseline: 2.10 ft/sec  Goal status: INITIAL  ASSESSMENT:  CLINICAL IMPRESSION: The patient is a 77 y.o. female presenting to OP Rehab with bilateral LE weakness, h/o vertigo, h/o chronic low back pain, and h/o falls. She presents with impairments in LE muscle strength, use of vestibular ocular reflex, slowed gait, and pain limiting function. PT  to further assess balance to establish baselines. Patient at risk for falls and will benefit from skilled physical therapy to decrease pain, improve QOL, decrease dizziness, and improve balance and safety.  Will continue to assessment in next session.    OBJECTIVE IMPAIRMENTS: Abnormal gait, decreased activity tolerance, decreased balance, decreased strength, dizziness, impaired sensation, and pain.   ACTIVITY LIMITATIONS: bending, squatting, and locomotion level  PARTICIPATION LIMITATIONS: meal prep, cleaning, and community activity  PERSONAL FACTORS: 3+ comorbidities: h/o falls, h/o chronic low back pain, h/vertigo  are also affecting patient's functional outcome.   REHAB POTENTIAL: Good  CLINICAL DECISION MAKING: Stable/uncomplicated  EVALUATION COMPLEXITY: Low  PLAN:  PT FREQUENCY: 2x/week  PT  DURATION: 8 weeks  PLANNED INTERVENTIONS: Therapeutic exercises, Therapeutic activity, Neuromuscular re-education, Balance training, Gait training, Patient/Family education, Self Care, Joint mobilization, Vestibular training, Canalith repositioning, Aquatic Therapy, Dry Needling, Electrical stimulation, Moist heat, Taping, and Manual therapy  PLAN FOR NEXT SESSION: Assess Berg, motion sensitivity and consider FGA. Plan to establish HEP consisting of VOR gaze x 1 viewing seated/standing, balance activities, and functional strength. Progress gait activities to tolerance and habituation as indicated.   Armoni Depass, PT 05/07/2023, 4:24 PM

## 2023-05-08 ENCOUNTER — Encounter: Payer: Self-pay | Admitting: Allergy

## 2023-05-08 ENCOUNTER — Ambulatory Visit: Payer: Medicare Other | Admitting: Allergy

## 2023-05-08 VITALS — BP 110/72 | HR 62 | Temp 98.1°F | Resp 16 | Ht 64.5 in | Wt 126.8 lb

## 2023-05-08 DIAGNOSIS — B999 Unspecified infectious disease: Secondary | ICD-10-CM | POA: Diagnosis not present

## 2023-05-08 DIAGNOSIS — T63481A Toxic effect of venom of other arthropod, accidental (unintentional), initial encounter: Secondary | ICD-10-CM

## 2023-05-08 DIAGNOSIS — R21 Rash and other nonspecific skin eruption: Secondary | ICD-10-CM

## 2023-05-08 DIAGNOSIS — Z889 Allergy status to unspecified drugs, medicaments and biological substances status: Secondary | ICD-10-CM

## 2023-05-08 DIAGNOSIS — N1330 Unspecified hydronephrosis: Secondary | ICD-10-CM | POA: Diagnosis not present

## 2023-05-08 DIAGNOSIS — J3089 Other allergic rhinitis: Secondary | ICD-10-CM

## 2023-05-08 DIAGNOSIS — J4540 Moderate persistent asthma, uncomplicated: Secondary | ICD-10-CM

## 2023-05-08 DIAGNOSIS — L309 Dermatitis, unspecified: Secondary | ICD-10-CM

## 2023-05-08 DIAGNOSIS — R3914 Feeling of incomplete bladder emptying: Secondary | ICD-10-CM | POA: Diagnosis not present

## 2023-05-08 DIAGNOSIS — T63481D Toxic effect of venom of other arthropod, accidental (unintentional), subsequent encounter: Secondary | ICD-10-CM

## 2023-05-08 DIAGNOSIS — T781XXD Other adverse food reactions, not elsewhere classified, subsequent encounter: Secondary | ICD-10-CM

## 2023-05-08 DIAGNOSIS — T7840XD Allergy, unspecified, subsequent encounter: Secondary | ICD-10-CM

## 2023-05-08 MED ORDER — ALBUTEROL SULFATE HFA 108 (90 BASE) MCG/ACT IN AERS: 2.0000 | INHALATION_SPRAY | RESPIRATORY_TRACT | 1 refills | Status: DC | PRN

## 2023-05-08 MED ORDER — EPINEPHRINE 0.3 MG/0.3ML IJ SOAJ: 0.3000 mg | INTRAMUSCULAR | 1 refills | Status: AC | PRN

## 2023-05-08 NOTE — Patient Instructions (Addendum)
Skin prick and intradermal testing today to penicillin-G, PrePen were negative.  Drug allergies Continue to avoid drugs on your allergy list. Schedule for amoxacillin drug challenge next. You must be off antihistamines for 3-5 days before. Must be in good health and not ill. No vaccines/injections/antibiotics within the past 7 days. Plan on being in the office for 2-3 hours and must bring in the drug you want to do the oral challenge for - will send in prescription to pick up a few days before. You must call to schedule an appointment and specify it's for a drug challenge.   Rash/allergic reactions: Keep track of episodes. Get bloodwork We are ordering labs, so please allow 1-2 weeks for the results to come back. With the newly implemented Cures Act, the labs might be visible to you at the same time that they become visible to me. However, I will not address the results until all of the results are back, so please be patient.  In the meantime, continue recommendations in your patient instructions, including avoidance measures (if applicable), until you hear from me. I have prescribed epinephrine injectable device and demonstrated proper use. For mild symptoms you can take over the counter antihistamines such as Benadryl 1-2 tablets = 25-50mg  and monitor symptoms closely. If symptoms worsen or if you have severe symptoms including breathing issues, throat closure, significant swelling, whole body hives, severe diarrhea and vomiting, lightheadedness then inject epinephrine and seek immediate medical care afterwards. Emergency action plan given.  Infections Keep track of infections and antibiotics use. If persistent will get bloodwork next to look at immune system.   Breathing Concerning for asthma. Daily controller medication(s): start Flovent 2 puffs twice a day with spacer and rinse mouth afterwards. This is the ORANGE inhaler.  Spacer given and demonstrated proper use with inhaler.  Patient understood technique and all questions/concerned were addressed.  May use albuterol rescue inhaler 2 puffs every 4 to 6 hours as needed for shortness of breath, chest tightness, coughing, and wheezing.  Monitor frequency of use - if you need to use it more than twice per week on a consistent basis let us know.  Breathing control goals:  Full participation in all desired activities (may need albuterol before activity) Albuterol use two times or less a week on average (not counting use with activity) Cough interfering with sleep two times or less a month Oral steroids no more than once a year No hospitalizations   Food Keep a food diary. Get bloodwork.  Rhinitis Get bloodwork. Use over the counter antihistamines such as Zyrtec (cetirizine), Claritin (loratadine), Allegra (fexofenadine), or Xyzal (levocetirizine) daily as needed. May switch antihistamines every few months.  Bee stings Continue to avoid. Get bloodwork.  Follow up for amoxicillin drug challenge.

## 2023-05-09 ENCOUNTER — Encounter: Payer: Self-pay | Admitting: Allergy

## 2023-05-09 ENCOUNTER — Ambulatory Visit: Payer: Medicare Other

## 2023-05-09 ENCOUNTER — Telehealth: Payer: Self-pay | Admitting: Allergy

## 2023-05-09 DIAGNOSIS — G8929 Other chronic pain: Secondary | ICD-10-CM | POA: Diagnosis not present

## 2023-05-09 DIAGNOSIS — R1084 Generalized abdominal pain: Secondary | ICD-10-CM

## 2023-05-09 DIAGNOSIS — R350 Frequency of micturition: Secondary | ICD-10-CM | POA: Diagnosis not present

## 2023-05-09 DIAGNOSIS — M542 Cervicalgia: Secondary | ICD-10-CM

## 2023-05-09 DIAGNOSIS — H8112 Benign paroxysmal vertigo, left ear: Secondary | ICD-10-CM

## 2023-05-09 DIAGNOSIS — R2681 Unsteadiness on feet: Secondary | ICD-10-CM

## 2023-05-09 DIAGNOSIS — R252 Cramp and spasm: Secondary | ICD-10-CM

## 2023-05-09 DIAGNOSIS — M545 Low back pain, unspecified: Secondary | ICD-10-CM | POA: Diagnosis not present

## 2023-05-09 DIAGNOSIS — J4540 Moderate persistent asthma, uncomplicated: Secondary | ICD-10-CM | POA: Insufficient documentation

## 2023-05-09 DIAGNOSIS — M6281 Muscle weakness (generalized): Secondary | ICD-10-CM | POA: Diagnosis not present

## 2023-05-09 DIAGNOSIS — T63481A Toxic effect of venom of other arthropod, accidental (unintentional), initial encounter: Secondary | ICD-10-CM | POA: Insufficient documentation

## 2023-05-09 DIAGNOSIS — M5459 Other low back pain: Secondary | ICD-10-CM | POA: Diagnosis not present

## 2023-05-09 DIAGNOSIS — R21 Rash and other nonspecific skin eruption: Secondary | ICD-10-CM

## 2023-05-09 DIAGNOSIS — R42 Dizziness and giddiness: Secondary | ICD-10-CM | POA: Diagnosis not present

## 2023-05-09 DIAGNOSIS — B999 Unspecified infectious disease: Secondary | ICD-10-CM | POA: Insufficient documentation

## 2023-05-09 DIAGNOSIS — T7840XA Allergy, unspecified, initial encounter: Secondary | ICD-10-CM | POA: Insufficient documentation

## 2023-05-09 DIAGNOSIS — J3089 Other allergic rhinitis: Secondary | ICD-10-CM | POA: Insufficient documentation

## 2023-05-09 DIAGNOSIS — R531 Weakness: Secondary | ICD-10-CM | POA: Diagnosis not present

## 2023-05-09 DIAGNOSIS — Z889 Allergy status to unspecified drugs, medicaments and biological substances status: Secondary | ICD-10-CM | POA: Insufficient documentation

## 2023-05-09 DIAGNOSIS — T781XXD Other adverse food reactions, not elsewhere classified, subsequent encounter: Secondary | ICD-10-CM | POA: Insufficient documentation

## 2023-05-09 DIAGNOSIS — R278 Other lack of coordination: Secondary | ICD-10-CM

## 2023-05-09 HISTORY — DX: Toxic effect of venom of other arthropod, accidental (unintentional), initial encounter: T63.481A

## 2023-05-09 HISTORY — DX: Rash and other nonspecific skin eruption: R21

## 2023-05-09 NOTE — Assessment & Plan Note (Signed)
Takes Claritin daily which helps with PND. No prior allergy testing. Had sinus surgery x 2.  Get bloodwork. Use over the counter antihistamines such as Zyrtec (cetirizine), Claritin (loratadine), Allegra (fexofenadine), or Xyzal (levocetirizine) daily as needed. May switch antihistamines every few months.

## 2023-05-09 NOTE — Assessment & Plan Note (Signed)
Milk causes bloating and eggs make her nauseous. Keep a food diary. Get bloodwork. Limit egg and dairy.

## 2023-05-09 NOTE — Assessment & Plan Note (Signed)
Follows with pulmonology and now only using Flovent and albuterol prn. Today's spirometry showed: mixed obstructive and restrictive disease with 30% improvement in FEV1 post bronchodilator treatment. Clinically feeling improved.  Daily controller medication(s): start Flovent 2 puffs twice a day with spacer and rinse mouth afterwards. This is the ORANGE inhaler.  Spacer given and demonstrated proper use with inhaler. Patient understood technique and all questions/concerned were addressed.  May use albuterol rescue inhaler 2 puffs every 4 to 6 hours as needed for shortness of breath, chest tightness, coughing, and wheezing.  Monitor frequency of use - if you need to use it more than twice per week on a consistent basis let us know.

## 2023-05-09 NOTE — Assessment & Plan Note (Signed)
Keep track of infections and antibiotics use. If persistent will get bloodwork next to look at immune system.  

## 2023-05-09 NOTE — Assessment & Plan Note (Signed)
History of frequent sinopulmonary infections with multiple antibiotic reactions. Tolerates omnicef and azithromycin. Sometimes has random rashes without taking any antibiotics. Medical history significant for breast cancer and stage 3 kidney disease. Penicillin - anaphylactic reaction in 1978 (airway tightness, hives, itching requiring epi and ER visit). Previously tolerated with no issues. Clindamycin - rash; lincomycin - rash; prednisone - hives but no issues if she takes it with benadryl; doxycycline - possibly rash; erythromycin - possibly rash; flu/pneumonia vaccine - localized reactions; latex - rash; levofloxacin - not sure.  Today's skin prick and intradermal testing today to penicillin-G, PrePen were negative. Continue to avoid drugs on the allergy list. Schedule for amoxicillin drug challenge next.

## 2023-05-09 NOTE — Assessment & Plan Note (Signed)
Keep track of episodes. Get bloodwork I have prescribed epinephrine injectable device and demonstrated proper use. For mild symptoms you can take over the counter antihistamines such as Benadryl 1-2 tablets = 25-50mg  and monitor symptoms closely. If symptoms worsen or if you have severe symptoms including breathing issues, throat closure, significant swelling, whole body hives, severe diarrhea and vomiting, lightheadedness then inject epinephrine and seek immediate medical care afterwards. Emergency action plan given.

## 2023-05-09 NOTE — Assessment & Plan Note (Signed)
Large localized reactions in the past.  Continue to avoid. Get bloodwork.

## 2023-05-09 NOTE — Telephone Encounter (Signed)
Please call patient and have her move her amoxicillin challenge to a later date.  I would like for her to get the bloodwork drawn - did she get it drawn yet?  And I also need her to take the Flovent and have her asthma be a little better before the drug challenge due to her past history of reactions to the penicillin.   If possible move to end of June and schedule under me.  Thank you.

## 2023-05-09 NOTE — Therapy (Signed)
OUTPATIENT PHYSICAL THERAPY NEURO TREATMENT   Patient Name: [name] MRN: [id number] DOB:[birth year], 77 y.o., female Today's Date: 05/09/2023  PCP: Danise Edge, MD REFERRING PROVIDER: Danise Edge, MD  END OF SESSION:  PT End of Session - 05/09/23 1101     Visit Number 2    Number of Visits 16    Date for PT Re-Evaluation 07/05/23    Authorization Type [employer] medicare    Progress Note Due on Visit 10    PT Start Time 1102    PT Stop Time 1140    PT Time Calculation (min) 38 min    Activity Tolerance Patient tolerated treatment well    Behavior During Therapy WFL for tasks assessed/performed             Past Medical History:  Diagnosis Date   Allergy    Anemia    Anxiety    past hx    Arthritis    Asthma    Atrial flutter (HCC)    past history- not current   Breast cancer (HCC)    CAD (coronary artery disease) CARDIOLOGIST--  DR Clifton James   mild non-obstructive cad   Cancer (HCC)    right   Cataract    bilaterally removed    Chronic constipation    Chronic kidney disease    stage 3 ckd, interstitial cystitis   Concussion    x 3   COPD (chronic obstructive pulmonary disease) (HCC)    Depression    past hx    Dyspnea    Dysrhythmia    Family history of adverse reaction to anesthesia    Family history of malignant hyperthermia    father had this   Fibromyalgia    Frequency of urination    GERD (gastroesophageal reflux disease)    H/O hiatal hernia    History of basal cell carcinoma excision    X2   History of breast cancer ONCOLOGIST-- DR Darnelle Catalan---  NO RECURRANCE   DX 07/2012;  LOW GRADE DCIS  ER+PR+  ----  S/P RIGHT LUMPECTOMY WITH NEGATIVE MARGINS/   RADIATION ENDED 11/2012   History of chronic bronchitis    History of colonic polyps    History of tachycardia    CONTROLLED  WITH ATENOLOL   Hyperlipidemia    Hypertension    Neuromuscular disorder (HCC)    fibromyalgia   Neuropathy    Osteoporosis 01/2019   T score -2.2 stable/improved  from prior study   Pelvic pain    Personal history of radiation therapy    Pneumonia    S/P radiation therapy 11/12/12 - 12/05/12   right Breast   Sepsis (HCC) 2014   from UTI    Sinus headache    Sjogren's disease (HCC)    Urgency of urination    Past Surgical History:  Procedure Laterality Date   73 HOUR PH STUDY N/A 04/03/2016   Procedure: 24 HOUR PH STUDY;  Surgeon: Ruffin Frederick, MD;  Location: [facility];  Service: Gastroenterology;  Laterality: N/A;   BREAST BIOPSY Right 08/23/2012   ADH   BREAST BIOPSY Right 10/11/2012   Ductal Carcinoma   BREAST EXCISIONAL BIOPSY Right 09/04/2013   benign   BREAST LUMPECTOMY Right 10/11/2012   W/ SLN BX   CARDIAC CATHETERIZATION  09-13-2007  DR Riley Kill   WELL-PRESERVED LVF/  DIFFUSE SCATTERED CORONARY CALCIFACATION AND ATHEROSCLEROSIS WITHOUT OBSTRUCTION   CARDIAC CATHETERIZATION  08-04-2010  DR Clifton James   NON-OBSTRUCTIVE CAD/  pLAD 40%/  oLAD 30%/  mLAD 30%/  pRCA 30%/  EF 60%   CARDIOVASCULAR STRESS TEST  06-18-2012  DR McALHANY   LOW RISK NUCLEAR STUDY/  SMALL FIXED AREA OF MODERATELY DECREASED UPTAKE IN ANTEROSEPTAL WALL WHICH MAY BE ARTIFACTUAL/  NO ISCHEMIA/  EF 68%   COLONOSCOPY  09/29/2010   CYSTOSCOPY     CYSTOSCOPY WITH HYDRODISTENSION AND BIOPSY N/A 03/06/2014   Procedure: CYSTOSCOPY/HYDRODISTENSION/ INSTILATION OF MARCAINE AND PYRIDIUM;  Surgeon: Kathi Ludwig, MD;  Location: [facility] [place];  Service: Urology;  Laterality: N/A;   CYSTOSCOPY/URETEROSCOPY/HOLMIUM LASER/STENT PLACEMENT Left 03/21/2023   Procedure: CYSTOSCOPY LEFT RETROGRADE PYELOGRAM, LEFT URETEROSCOPY, HOLMIUM LASER LITHOTRIPSYM, AND LEFT URETERAL STENT PLACEMENT;  Surgeon: Rene Paci, MD;  Location: [facility];  Service: Urology;  Laterality: Left;  60 MINUTES   ESOPHAGEAL MANOMETRY N/A 04/03/2016   Procedure: ESOPHAGEAL MANOMETRY (EM);  Surgeon: Ruffin Frederick, MD;  Location: [facility];  Service:  Gastroenterology;  Laterality: N/A;   EXTRACORPOREAL SHOCK WAVE LITHOTRIPSY Left 02/06/2019   Procedure: EXTRACORPOREAL SHOCK WAVE LITHOTRIPSY (ESWL);  Surgeon: Ihor Gully, MD;  Location: [facility];  Service: Urology;  Laterality: Left;   NASAL SINUS SURGERY  1985   ORIF RIGHT ANKLE  FX  2006   POLYPECTOMY     REMOVAL VOCAL CORD CYST  01/2013   RIGHT BREAST BX  08/23/2012   RIGHT HAND SURGERY  X3  LAST ONE 2009   INCLUDES  ORIF RIGHT 5TH FINGER AND REVISION TWICE   SKIN CANCER EXCISION     face,arms   TONSILLECTOMY AND ADENOIDECTOMY  AGE 71   TOTAL ABDOMINAL HYSTERECTOMY W/ BILATERAL SALPINGOOPHORECTOMY  1982   W/  APPENDECTOMY   TRANSTHORACIC ECHOCARDIOGRAM  06/24/2012   GRADE I DIASTOLIC DYSFUNCTION/  EF 55-60%/  MILD MR   UPPER GASTROINTESTINAL ENDOSCOPY     Patient Active Problem List   Diagnosis Date Noted   Weight loss 05/01/2023   Strep pharyngitis 04/24/2023   Common bile duct dilatation 02/13/2023   Oral herpes 02/05/2023   Tongue lesion 02/05/2023   Drug reaction 02/05/2023   Cellulitis of breast 02/05/2023   Elevated alkaline phosphatase level 01/25/2023   Facial trauma 01/22/2023   Vertigo 01/22/2023   Intertrigo 12/13/2022   Hyperkalemia 09/28/2022   Anemia 09/28/2022   Tremor 07/05/2022   RUQ pain 07/05/2022   Leukocytosis 07/05/2022   Chronic renal insufficiency 03/21/2022   Sjogren's disease (HCC) 12/12/2021   Vocal cord cyst 05/18/2021   High serum vitamin D 05/18/2021   Dysuria 03/09/2021   Vitamin B12 deficiency 03/08/2021   Preventative health care 03/08/2021   Hyperglycemia 03/08/2021   Low back pain 09/08/2020   OSA and COPD overlap syndrome (HCC) 06/10/2020   Recurrent falls 05/02/2020   Statin intolerance 02/29/2020   RLS (restless legs syndrome) 11/09/2019   Kidney stone 02/26/2019   Recurrent sinusitis 02/25/2019   Hx of colonic polyp 02/25/2019   DDD (degenerative disc disease), cervical 09/17/2018   Degenerative scoliosis 04/22/2018    Primary insomnia 06/25/2017   Chronic interstitial cystitis 02/01/2017   Chronic cough 01/09/2017   Right arm pain 07/20/2016   Deviated septum 05/12/2016   Nasal turbinate hypertrophy 05/12/2016   Dysphagia    Allergic rhinitis 01/25/2016   Other and combined forms of senile cataract 07/15/2015   Dyspnea    Left hip pain 04/30/2015   Sciatica 04/30/2015   Knee pain, right 04/30/2015   Bilateral carpal tunnel syndrome 03/04/2015   Hyperlipidemia 03/01/2015   Pulmonary nodules 02/12/2015   External hemorrhoids  02/05/2015   Chronic night sweats 01/08/2015   Atypical chest pain 01/01/2015   Memory loss 05/22/2014   Peripheral neuropathy 05/22/2014   Abdominal pain 10/26/2013   Headache 10/13/2013   Arthralgia 08/18/2013   ANA positive 07/29/2013   Depression 02/05/2013   Family history of malignant hyperthermia 02/05/2013   Malignant neoplasm of lower-outer quadrant of right breast of female, estrogen receptor positive (HCC) 01/03/2013   Splenic lesion 09/02/2012   Asthma, allergic 08/05/2012   Acute bronchitis with COPD (HCC) 06/03/2012   GERD (gastroesophageal reflux disease) 06/03/2012   Edema 04/08/2012   Stricture and stenosis of esophagus 10/19/2011   Functional constipation 07/21/2011   Nonspecific (abnormal) findings on radiological and other examination of biliary tract 07/21/2011   Osteoporosis 04/17/2011   Leg pain 02/24/2011   FIBROCYSTIC BREAST DISEASE, HX OF 10/18/2010   Essential hypertension 08/25/2010   CAD in native artery 08/25/2010   TOBACCO ABUSE, HX OF 08/25/2010   GENERALIZED ANXIETY DISORDER 08/03/2010   Breast pain 01/11/2010   Fibromyalgia 01/11/2010    ONSET DATE: 04/24/23 REFERRING DIAG:  R42 (ICD-10-CM) - Dizzy spells  R53.1 (ICD-10-CM) - Weakness   THERAPY DIAG:  Dizziness and giddiness  Unsteadiness on feet  BPPV (benign paroxysmal positional vertigo), left  Chronic bilateral low back pain without sciatica  Muscle weakness  (generalized)  Cervicalgia  Cramp and spasm  Other low back pain  Other lack of coordination  Generalized abdominal pain  Rationale for Evaluation and Treatment: Rehabilitation  SUBJECTIVE:                                                                                                                                                                                           SUBJECTIVE STATEMENT: Patient reports she "jumped out of bed" and had a rushed morning due to multiple appointments, states she was very dizzy due to rushing but now she just feels "foggy". Patient states her low back as been hurting since Monday.   Pt accompanied by: self  PERTINENT HISTORY: chronic back pain, (scheduled for back ablation in June 2024), h/o vertigo PAIN:  Are you having pain? Yes: NPRS scale: regular pain in low back/10 Pain location: low back  Pain description: chronic Aggravating factors: PT will monitor Relieving factors: *has upcoming intervention scheduled PRECAUTIONS: Fall WEIGHT BEARING RESTRICTIONS: No FALLS: Has patient fallen in last 6 months? Yes. Number of falls 1.  LIVING ENVIRONMENT: Lives with: lives with their spouse Lives in: House/apartment Stairs: One step with one rail PLOF: Independent with basic ADLs-- notes declining mobility.  PATIENT GOALS: "stop the spinning and be able to walk and not falter."  OBJECTIVE:  COGNITION: Overall cognitive status:  Patient mentions memory changes since first fall where she hit her head -- she is unable to recall date   SENSATION:  Patient reports numbness and cold sensations in feet.  POSTURE:  WFLs  LOWER EXTREMITY ROM:   WFLs   LOWER EXTREMITY MMT:    MMT Right Eval Left Eval  Hip flexion 3+/5 4/5  Knee flexion 4/5 4/5  Knee extension 5/5 5/5  Ankle dorsiflexion 3+/5 3+/5  Ankle plantarflexion    Ankle inversion    Ankle eversion    (Blank rows = not tested)  BED MOBILITY:  Sit to supine Complete  Independence Supine to sit Complete Independence Rolling to Right Complete Independence Rolling to Left Complete Independence *notes "I'm in a fog" with returning to sitting  GAIT: Gait pattern:  slowed pace with SPC and wide BOS Distance walked: 100 ft Assistive device utilized: Single point cane  FUNCTIONAL TESTS:  Berg= 40/56 5 time sit to stand=20.46 seconds with Ues on legs 32M=0.64 m/sec or 2.10 ft/sec  PATIENT SURVEYS:  FOTO n/a--not measured  due to vertigo/balance  VESTIBULAR ASSESSMENT:  [facility] Adult PT Treatment:                                                DATE: 05/09/2023 Neuromuscular re-ed: Seated slow VORx1 (yaw)  Therapeutic Activity: Berg balance test (see above)    GENERAL OBSERVATION: Gets intermittent dizziness   SYMPTOM BEHAVIOR:  Subjective history: Has intermittent h/o vertigo  Non-Vestibular symptoms:  none  Type of dizziness: Spinning/Vertigo, Unsteady with head/body turns, and Lightheadedness/Faint  Frequency: daily   Duration: seconds to minutes  Aggravating factors: Worse in the morning and with getting up after sitting for longer periods  Relieving factors: unsure  Progression of symptoms: unchanged  OCULOMOTOR EXAM:  Ocular Alignment: normal  Ocular ROM: No Limitations  Spontaneous Nystagmus: absent  Gaze-Induced Nystagmus: absent  Smooth Pursuits: intact but notes target seems "out of reach"   Saccades: intact   VESTIBULAR - OCULAR REFLEX:   Slow VOR: Comment: 2 reps provokes a sensation of bad spinning  Head-Impulse Test:  Did not test today due to inability to tolerate slow VOR  Dynamic Visual Acuity: to be assessed   POSITIONAL TESTING: Right Dix-Hallpike: no nystagmus Left Dix-Hallpike: no nystagmus Right Roll Test: TBA Left Roll Test: TBA Right Sidelying: no nystagmus Left Sidelying: no nystagmus *Notes symptoms with return to sitting.   PATIENT EDUCATION: Education details: goals of physical therapy Person educated:  Patient Education method: Explanation Education comprehension: verbalized understanding  HOME EXERCISE PROGRAM:   GOALS: Goals reviewed with patient? Yes  SHORT TERM GOALS: Target date: 06/06/23  Patient will be independent with initial HEP.  Baseline: To be given Goal status: INITIAL  2.  Patient will complete Berg, and FGA if indicated. Baseline: not done at eval Goal status: INITIAL   3.  Patient will report decreased dizziness by 50%. Baseline: not finished.  Goal status: INITIAL  LONG TERM GOALS:  Target date: 07/05/23  Patient will be independent with advanced/ongoing HEP to improve outcomes and carryover.  Baseline: to be established. Goal status: INITIAL  2.  Patient will improve Berg balance score by 5 points to demo improved functional balance.  Baseline: to be assessed  Goal status: INITIAL  3. Patient will demo floor<>stand with UE support mod indep for improved  functional strength.  Baseline: has h/o falls.  Goal status: INITIAL  4. Patient will improve gait speed to 2.8 ft/sec to demo improving mobility and dec'd risk for falls.  Baseline: 2.10 ft/sec  Goal status: INITIAL  ASSESSMENT:  CLINICAL IMPRESSION: Berg balance testing completed (see above); patient most challenged with single leg balance and NBOS standing. CGA provided with NBOS standing tasks due to instability. Increased symptoms (nausea) during slow VORx1 with head turns in yaw plane; symptoms decreased with seated rest break and water.    OBJECTIVE IMPAIRMENTS: Abnormal gait, decreased activity tolerance, decreased balance, decreased strength, dizziness, impaired sensation, and pain.   ACTIVITY LIMITATIONS: bending, squatting, and locomotion level  PARTICIPATION LIMITATIONS: meal prep, cleaning, and community activity  PERSONAL FACTORS: 3+ comorbidities: h/o falls, h/o chronic low back pain, h/vertigo  are also affecting patient's functional outcome.   REHAB POTENTIAL: Good  CLINICAL  DECISION MAKING: Stable/uncomplicated  EVALUATION COMPLEXITY: Low  PLAN:  PT FREQUENCY: 2x/week  PT DURATION: 8 weeks  PLANNED INTERVENTIONS: Therapeutic exercises, Therapeutic activity, Neuromuscular re-education, Balance training, Gait training, Patient/Family education, Self Care, Joint mobilization, Vestibular training, Canalith repositioning, Aquatic Therapy, Dry Needling, Electrical stimulation, Moist heat, Taping, and Manual therapy  PLAN FOR NEXT SESSION: Assess motion sensitivity and consider FGA. Plan to establish HEP consisting of VOR gaze x 1 viewing seated/standing, balance activities, and functional strength. Progress gait activities to tolerance and habituation as indicated.   Sanjuana Mae, PTA 05/09/2023, 11:40 AM

## 2023-05-10 NOTE — Telephone Encounter (Signed)
Lm for pt to call us back to get appt changed

## 2023-05-10 NOTE — Telephone Encounter (Signed)
She has not had the blood work done yet, she is not doing the [employer] inhaler.

## 2023-05-15 ENCOUNTER — Other Ambulatory Visit: Payer: Self-pay | Admitting: Family Medicine

## 2023-05-16 ENCOUNTER — Ambulatory Visit: Payer: Medicare Other | Admitting: Rehabilitative and Restorative Service Providers"

## 2023-05-16 ENCOUNTER — Encounter: Payer: Self-pay | Admitting: Rehabilitative and Restorative Service Providers"

## 2023-05-16 DIAGNOSIS — R2681 Unsteadiness on feet: Secondary | ICD-10-CM | POA: Diagnosis not present

## 2023-05-16 DIAGNOSIS — R1084 Generalized abdominal pain: Secondary | ICD-10-CM | POA: Diagnosis not present

## 2023-05-16 DIAGNOSIS — M542 Cervicalgia: Secondary | ICD-10-CM | POA: Diagnosis not present

## 2023-05-16 DIAGNOSIS — G8929 Other chronic pain: Secondary | ICD-10-CM | POA: Diagnosis not present

## 2023-05-16 DIAGNOSIS — H8112 Benign paroxysmal vertigo, left ear: Secondary | ICD-10-CM | POA: Diagnosis not present

## 2023-05-16 DIAGNOSIS — R42 Dizziness and giddiness: Secondary | ICD-10-CM | POA: Diagnosis not present

## 2023-05-16 DIAGNOSIS — R531 Weakness: Secondary | ICD-10-CM | POA: Diagnosis not present

## 2023-05-16 DIAGNOSIS — M545 Low back pain, unspecified: Secondary | ICD-10-CM | POA: Diagnosis not present

## 2023-05-16 DIAGNOSIS — R252 Cramp and spasm: Secondary | ICD-10-CM | POA: Diagnosis not present

## 2023-05-16 DIAGNOSIS — M6281 Muscle weakness (generalized): Secondary | ICD-10-CM | POA: Diagnosis not present

## 2023-05-16 DIAGNOSIS — M5459 Other low back pain: Secondary | ICD-10-CM | POA: Diagnosis not present

## 2023-05-16 DIAGNOSIS — R278 Other lack of coordination: Secondary | ICD-10-CM | POA: Diagnosis not present

## 2023-05-16 NOTE — Therapy (Signed)
OUTPATIENT PHYSICAL THERAPY NEURO TREATMENT   Patient Name: [name] MRN: [id number] DOB:1946/09/21, 77 y.o., female Today's Date: 05/16/2023  PCP: Danise Edge, MD REFERRING PROVIDER: Danise Edge, MD  END OF SESSION:  PT End of Session - 05/16/23 0929     Visit Number 3    Number of Visits 16    Date for PT Re-Evaluation 07/05/23    Authorization Type [employer] medicare    Progress Note Due on Visit 10    PT Start Time 0930    PT Stop Time 1015    PT Time Calculation (min) 45 min    Activity Tolerance Patient tolerated treatment well    Behavior During Therapy WFL for tasks assessed/performed            Past Medical History:  Diagnosis Date   Allergy    Anemia    Anxiety    past hx    Arthritis    Asthma    Atrial flutter (HCC)    past history- not current   Breast cancer (HCC)    CAD (coronary artery disease) CARDIOLOGIST--  DR Clifton James   mild non-obstructive cad   Cancer (HCC)    right   Cataract    bilaterally removed    Chronic constipation    Chronic kidney disease    stage 3 ckd, interstitial cystitis   Concussion    x 3   COPD (chronic obstructive pulmonary disease) (HCC)    Depression    past hx    Dyspnea    Dysrhythmia    Family history of adverse reaction to anesthesia    Family history of malignant hyperthermia    father had this   Fibromyalgia    Frequency of urination    GERD (gastroesophageal reflux disease)    H/O hiatal hernia    History of basal cell carcinoma excision    X2   History of breast cancer ONCOLOGIST-- DR Darnelle Catalan---  NO RECURRANCE   DX 07/2012;  LOW GRADE DCIS  ER+PR+  ----  S/P RIGHT LUMPECTOMY WITH NEGATIVE MARGINS/   RADIATION ENDED 11/2012   History of chronic bronchitis    History of colonic polyps    History of tachycardia    CONTROLLED  WITH ATENOLOL   Hyperlipidemia    Hypertension    Neuromuscular disorder (HCC)    fibromyalgia   Neuropathy    Osteoporosis 01/2019   T score -2.2 stable/improved from  prior study   Pelvic pain    Personal history of radiation therapy    Pneumonia    S/P radiation therapy 11/12/12 - 12/05/12   right Breast   Sepsis (HCC) 2014   from UTI    Sinus headache    Sjogren's disease (HCC)    Urgency of urination    Past Surgical History:  Procedure Laterality Date   98 HOUR PH STUDY N/A 04/03/2016   Procedure: 24 HOUR PH STUDY;  Surgeon: Ruffin Frederick, MD;  Location: [facility];  Service: Gastroenterology;  Laterality: N/A;   BREAST BIOPSY Right 08/23/2012   ADH   BREAST BIOPSY Right 10/11/2012   Ductal Carcinoma   BREAST EXCISIONAL BIOPSY Right 09/04/2013   benign   BREAST LUMPECTOMY Right 10/11/2012   W/ SLN BX   CARDIAC CATHETERIZATION  09-13-2007  DR Riley Kill   WELL-PRESERVED LVF/  DIFFUSE SCATTERED CORONARY CALCIFACATION AND ATHEROSCLEROSIS WITHOUT OBSTRUCTION   CARDIAC CATHETERIZATION  08-04-2010  DR Clifton James   NON-OBSTRUCTIVE CAD/  pLAD 40%/  oLAD 30%/  mLAD 30%/  pRCA 30%/  EF 60%   CARDIOVASCULAR STRESS TEST  06-18-2012  DR McALHANY   LOW RISK NUCLEAR STUDY/  SMALL FIXED AREA OF MODERATELY DECREASED UPTAKE IN ANTEROSEPTAL WALL WHICH MAY BE ARTIFACTUAL/  NO ISCHEMIA/  EF 68%   COLONOSCOPY  09/29/2010   CYSTOSCOPY     CYSTOSCOPY WITH HYDRODISTENSION AND BIOPSY N/A 03/06/2014   Procedure: CYSTOSCOPY/HYDRODISTENSION/ INSTILATION OF MARCAINE AND PYRIDIUM;  Surgeon: Kathi Ludwig, MD;  Location: [facility] [place];  Service: Urology;  Laterality: N/A;   CYSTOSCOPY/URETEROSCOPY/HOLMIUM LASER/STENT PLACEMENT Left 03/21/2023   Procedure: CYSTOSCOPY LEFT RETROGRADE PYELOGRAM, LEFT URETEROSCOPY, HOLMIUM LASER LITHOTRIPSYM, AND LEFT URETERAL STENT PLACEMENT;  Surgeon: Rene Paci, MD;  Location: [facility];  Service: Urology;  Laterality: Left;  60 MINUTES   ESOPHAGEAL MANOMETRY N/A 04/03/2016   Procedure: ESOPHAGEAL MANOMETRY (EM);  Surgeon: Ruffin Frederick, MD;  Location: [facility];  Service: Gastroenterology;   Laterality: N/A;   EXTRACORPOREAL SHOCK WAVE LITHOTRIPSY Left 02/06/2019   Procedure: EXTRACORPOREAL SHOCK WAVE LITHOTRIPSY (ESWL);  Surgeon: Ihor Gully, MD;  Location: [facility];  Service: Urology;  Laterality: Left;   NASAL SINUS SURGERY  1985   ORIF RIGHT ANKLE  FX  2006   POLYPECTOMY     REMOVAL VOCAL CORD CYST  01/2013   RIGHT BREAST BX  08/23/2012   RIGHT HAND SURGERY  X3  LAST ONE 2009   INCLUDES  ORIF RIGHT 5TH FINGER AND REVISION TWICE   SKIN CANCER EXCISION     face,arms   TONSILLECTOMY AND ADENOIDECTOMY  AGE 66   TOTAL ABDOMINAL HYSTERECTOMY W/ BILATERAL SALPINGOOPHORECTOMY  1982   W/  APPENDECTOMY   TRANSTHORACIC ECHOCARDIOGRAM  06/24/2012   GRADE I DIASTOLIC DYSFUNCTION/  EF 55-60%/  MILD MR   UPPER GASTROINTESTINAL ENDOSCOPY     Patient Active Problem List   Diagnosis Date Noted   Multiple drug allergies 05/09/2023   Recurrent infections 05/09/2023   Rash and other nonspecific skin eruption 05/09/2023   Local reaction to hymenoptera sting 05/09/2023   Allergic reaction 05/09/2023   Other adverse food reactions, not elsewhere classified, subsequent encounter 05/09/2023   Moderate persistent asthma without complication 05/09/2023   Other allergic rhinitis 05/09/2023   Weight loss 05/01/2023   Strep pharyngitis 04/24/2023   Common bile duct dilatation 02/13/2023   Oral herpes 02/05/2023   Tongue lesion 02/05/2023   Drug reaction 02/05/2023   Cellulitis of breast 02/05/2023   Elevated alkaline phosphatase level 01/25/2023   Facial trauma 01/22/2023   Vertigo 01/22/2023   Intertrigo 12/13/2022   Hyperkalemia 09/28/2022   Anemia 09/28/2022   Tremor 07/05/2022   RUQ pain 07/05/2022   Leukocytosis 07/05/2022   Chronic renal insufficiency 03/21/2022   Sjogren's disease (HCC) 12/12/2021   Vocal cord cyst 05/18/2021   High serum vitamin D 05/18/2021   Dysuria 03/09/2021   Vitamin B12 deficiency 03/08/2021   Preventative health care 03/08/2021   Hyperglycemia  03/08/2021   Low back pain 09/08/2020   OSA and COPD overlap syndrome (HCC) 06/10/2020   Recurrent falls 05/02/2020   Statin intolerance 02/29/2020   RLS (restless legs syndrome) 11/09/2019   Kidney stone 02/26/2019   Recurrent sinusitis 02/25/2019   Hx of colonic polyp 02/25/2019   DDD (degenerative disc disease), cervical 09/17/2018   Degenerative scoliosis 04/22/2018   Primary insomnia 06/25/2017   Chronic interstitial cystitis 02/01/2017   Chronic cough 01/09/2017   Right arm pain 07/20/2016   Deviated septum 05/12/2016   Nasal turbinate hypertrophy 05/12/2016  Dysphagia    Allergic rhinitis 01/25/2016   Other and combined forms of senile cataract 07/15/2015   Dyspnea    Left hip pain 04/30/2015   Sciatica 04/30/2015   Knee pain, right 04/30/2015   Bilateral carpal tunnel syndrome 03/04/2015   Hyperlipidemia 03/01/2015   Pulmonary nodules 02/12/2015   External hemorrhoids 02/05/2015   Chronic night sweats 01/08/2015   Atypical chest pain 01/01/2015   Memory loss 05/22/2014   Peripheral neuropathy 05/22/2014   Abdominal pain 10/26/2013   Headache 10/13/2013   Arthralgia 08/18/2013   ANA positive 07/29/2013   Depression 02/05/2013   Family history of malignant hyperthermia 02/05/2013   Malignant neoplasm of lower-outer quadrant of right breast of female, estrogen receptor positive (HCC) 01/03/2013   Splenic lesion 09/02/2012   Asthma, allergic 08/05/2012   Acute bronchitis with COPD (HCC) 06/03/2012   GERD (gastroesophageal reflux disease) 06/03/2012   Edema 04/08/2012   Stricture and stenosis of esophagus 10/19/2011   Functional constipation 07/21/2011   Nonspecific (abnormal) findings on radiological and other examination of biliary tract 07/21/2011   Osteoporosis 04/17/2011   Leg pain 02/24/2011   FIBROCYSTIC BREAST DISEASE, HX OF 10/18/2010   Essential hypertension 08/25/2010   CAD in native artery 08/25/2010   TOBACCO ABUSE, HX OF 08/25/2010   GENERALIZED  ANXIETY DISORDER 08/03/2010   Breast pain 01/11/2010   Fibromyalgia 01/11/2010    ONSET DATE: 04/24/23 REFERRING DIAG:  R42 (ICD-10-CM) - Dizzy spells  R53.1 (ICD-10-CM) - Weakness   THERAPY DIAG:  Dizziness and giddiness  Unsteadiness on feet  BPPV (benign paroxysmal positional vertigo), left  Rationale for Evaluation and Treatment: Rehabilitation  SUBJECTIVE:                                                                                                                                                                                           SUBJECTIVE STATEMENT: Patient reports she went on a long trip and had increased pain in her mid to low back. She also dropped a pork chop on her left foot and has some mild bruising.  Pt accompanied by: self PERTINENT HISTORY: chronic back pain, (scheduled for back ablation in June 2024), h/o vertigo PAIN:  Are you having pain? Yes: NPRS scale: 7/10 Pain location: low back  Pain description: chronic Aggravating factors: PT will monitor Relieving factors: *has upcoming intervention scheduled PRECAUTIONS: Fall WEIGHT BEARING RESTRICTIONS: No FALLS: Has patient fallen in last 6 months? Yes. Number of falls 1.  LIVING ENVIRONMENT: Lives with: lives with their spouse Lives in: House/apartment Stairs: One step with one rail PLOF: Independent with basic ADLs-- notes declining mobility.  PATIENT GOALS: "stop  the spinning and be able to walk and not falter."  OBJECTIVE:  (Measures in this section from initial evaluation unless otherwise noted) COGNITION: Overall cognitive status:  Patient mentions memory changes since first fall where she hit her head -- she is unable to recall date SENSATION:  Patient reports numbness and cold sensations in feet. LOWER EXTREMITY MMT:   MMT Right Eval Left Eval  Hip flexion 3+/5 4/5  Knee flexion 4/5 4/5  Knee extension 5/5 5/5  Ankle dorsiflexion 3+/5 3+/5  Ankle plantarflexion    Ankle inversion     Ankle eversion    (Blank rows = not tested) BED MOBILITY:  Sit to supine Complete Independence Supine to sit Complete Independence Rolling to Right Complete Independence Rolling to Left Complete Independence *notes "I'm in a fog" with returning to sitting GAIT: Gait pattern:  slowed pace with SPC and wide BOS Distance walked: 100 ft Assistive device utilized: Single point cane FUNCTIONAL TESTS:  Berg= 40/56 5 time sit to stand=20.46 seconds with Ues on legs 70M=0.64 m/sec or 2.10 ft/sec PATIENT SURVEYS:  FOTO n/a--not measured  due to vertigo/balance  VESTIBULAR ASSESSMENT:  GENERAL OBSERVATION: Gets intermittent dizziness SYMPTOM BEHAVIOR:  Subjective history: Has intermittent h/o vertigo  Non-Vestibular symptoms:  none  Type of dizziness: Spinning/Vertigo, Unsteady with head/body turns, and Lightheadedness/Faint  Frequency: daily   Duration: seconds to minutes  Aggravating factors: Worse in the morning and with getting up after sitting for longer periods  Relieving factors: unsure  Progression of symptoms: unchanged OCULOMOTOR EXAM:  Ocular Alignment: normal  Ocular ROM: No Limitations  Spontaneous Nystagmus: absent  Gaze-Induced Nystagmus: absent  Smooth Pursuits: intact but notes target seems "out of reach"   Saccades: intact VESTIBULAR - OCULAR REFLEX:   Slow VOR: Comment: 2 reps provokes a sensation of bad spinning  Head-Impulse Test:  Did not test today due to inability to tolerate slow VOR  Dynamic Visual Acuity: to be assessed POSITIONAL TESTING: Right Dix-Hallpike: no nystagmus Left Dix-Hallpike: no nystagmus Right Roll Test: TBA Left Roll Test: TBA Right Sidelying: no nystagmus Left Sidelying: no nystagmus *Notes symptoms with return to sitting.   [facility] Adult PT Treatment:                                                DATE: 05/16/23 Therapeutic Exercise: Supine Towel roll under sacral region for gentle stretch Marching x 12 reps Gentle rocking  lateral rotation Bent knee fallouts with pain L hip Decompression exercises with ball push x 5 second holds x 5 reps Sitting Sit<>stand x 5 reps with cues to power up x 3 sets Neuromuscular re-ed: Notes some spinning with return to sitting after therapeutic ex Habituation: with seated horiz head turns x 3 reps before getting shooting HA Habituation vertical head turns Gaze x 1 attempted with difficulty-- switched to seated slow habituation with eyes and head moving Standing wall bumps x 5 reps Heel raises Reaching in stride position for target on wall Self Care: Patient uses "Better Me" wellness platform and is doing chair yoga   [facility] Adult PT Treatment:                                                DATE:  05/09/2023 Neuromuscular re-ed: Seated slow VORx1 (yaw)  Therapeutic Activity: Berg balance test (see above)  PATIENT EDUCATION: Education details: goals of physical therapy Person educated: Patient Education method: Explanation Education comprehension: verbalized understanding  HOME EXERCISE PROGRAM: Access Code: [email or web address] URL: [email or web address] Date: 05/16/2023 Prepared by: Margretta Ditty  Exercises - Sit to Stand with Armchair  - 2 x daily - 7 x weekly - 1 sets - 5 reps - Seated habituation head turns  - 2 x daily - 7 x weekly - 1 sets - 3 reps  GOALS: Goals reviewed with patient? Yes  SHORT TERM GOALS: Target date: 06/06/23  Patient will be independent with initial HEP.  Baseline: To be given Goal status: INITIAL  2.  Patient will complete Berg, and FGA if indicated. Baseline: not done at eval Goal status: INITIAL   3.  Patient will report decreased dizziness by 50%. Baseline: not finished.  Goal status: INITIAL  LONG TERM GOALS:  Target date: 07/05/23  Patient will be independent with advanced/ongoing HEP to improve outcomes and carryover.  Baseline: to be established. Goal status: INITIAL  2.  Patient will improve Berg balance  score by 5 points to demo improved functional balance.  Baseline: to be assessed  Goal status: INITIAL  3. Patient will demo floor<>stand with UE support mod indep for improved functional strength.  Baseline: has h/o falls.  Goal status: INITIAL  4. Patient will improve gait speed to 2.8 ft/sec to demo improving mobility and dec'd risk for falls.  Baseline: 2.10 ft/sec  Goal status: INITIAL  ASSESSMENT:  CLINICAL IMPRESSION: The patient tolerated more activity today. Focused on habituation, balance, and low back activities. Plan to continue to progress to patient tolerance.   OBJECTIVE IMPAIRMENTS: Abnormal gait, decreased activity tolerance, decreased balance, decreased strength, dizziness, impaired sensation, and pain.   PLAN:  PT FREQUENCY: 2x/week  PT DURATION: 8 weeks  PLANNED INTERVENTIONS: Therapeutic exercises, Therapeutic activity, Neuromuscular re-education, Balance training, Gait training, Patient/Family education, Self Care, Joint mobilization, Vestibular training, Canalith repositioning, Aquatic Therapy, Dry Needling, Electrical stimulation, Moist heat, Taping, and Manual therapy  PLAN FOR NEXT SESSION: Assess motion sensitivity and consider FGA. Plan to establish HEP consisting of VOR gaze x 1 viewing seated/standing, balance activities, and functional strength. Progress gait activities to tolerance and habituation as indicated.  Vanellope Passmore, PT 05/16/2023

## 2023-05-17 ENCOUNTER — Encounter: Payer: Medicare Other | Admitting: Family

## 2023-05-18 ENCOUNTER — Ambulatory Visit: Payer: Medicare Other

## 2023-05-18 DIAGNOSIS — G8929 Other chronic pain: Secondary | ICD-10-CM

## 2023-05-18 DIAGNOSIS — M542 Cervicalgia: Secondary | ICD-10-CM

## 2023-05-18 DIAGNOSIS — R2681 Unsteadiness on feet: Secondary | ICD-10-CM

## 2023-05-18 DIAGNOSIS — M5459 Other low back pain: Secondary | ICD-10-CM

## 2023-05-18 DIAGNOSIS — R42 Dizziness and giddiness: Secondary | ICD-10-CM

## 2023-05-18 DIAGNOSIS — R252 Cramp and spasm: Secondary | ICD-10-CM

## 2023-05-18 DIAGNOSIS — H8112 Benign paroxysmal vertigo, left ear: Secondary | ICD-10-CM

## 2023-05-18 DIAGNOSIS — R278 Other lack of coordination: Secondary | ICD-10-CM

## 2023-05-18 DIAGNOSIS — R1084 Generalized abdominal pain: Secondary | ICD-10-CM

## 2023-05-18 DIAGNOSIS — M545 Low back pain, unspecified: Secondary | ICD-10-CM | POA: Diagnosis not present

## 2023-05-18 DIAGNOSIS — M6281 Muscle weakness (generalized): Secondary | ICD-10-CM

## 2023-05-18 DIAGNOSIS — R531 Weakness: Secondary | ICD-10-CM | POA: Diagnosis not present

## 2023-05-18 NOTE — Therapy (Signed)
OUTPATIENT PHYSICAL THERAPY NEURO TREATMENT   Patient Name: [name] MRN: [id number] DOB:[birth year], 77 y.o., female Today's Date: 05/18/2023  PCP: Danise Edge, MD REFERRING PROVIDER: Danise Edge, MD  END OF SESSION:  PT End of Session - 05/18/23 0932     Visit Number 4    Number of Visits 16    Date for PT Re-Evaluation 07/05/23    Authorization Type [employer] medicare    Progress Note Due on Visit 10    PT Start Time 0933    PT Stop Time 1017    PT Time Calculation (min) 44 min    Activity Tolerance Patient tolerated treatment well    Behavior During Therapy WFL for tasks assessed/performed            Past Medical History:  Diagnosis Date   Allergy    Anemia    Anxiety    past hx    Arthritis    Asthma    Atrial flutter (HCC)    past history- not current   Breast cancer (HCC)    CAD (coronary artery disease) CARDIOLOGIST--  DR Clifton James   mild non-obstructive cad   Cancer (HCC)    right   Cataract    bilaterally removed    Chronic constipation    Chronic kidney disease    stage 3 ckd, interstitial cystitis   Concussion    x 3   COPD (chronic obstructive pulmonary disease) (HCC)    Depression    past hx    Dyspnea    Dysrhythmia    Family history of adverse reaction to anesthesia    Family history of malignant hyperthermia    father had this   Fibromyalgia    Frequency of urination    GERD (gastroesophageal reflux disease)    H/O hiatal hernia    History of basal cell carcinoma excision    X2   History of breast cancer ONCOLOGIST-- DR Darnelle Catalan---  NO RECURRANCE   DX 07/2012;  LOW GRADE DCIS  ER+PR+  ----  S/P RIGHT LUMPECTOMY WITH NEGATIVE MARGINS/   RADIATION ENDED 11/2012   History of chronic bronchitis    History of colonic polyps    History of tachycardia    CONTROLLED  WITH ATENOLOL   Hyperlipidemia    Hypertension    Neuromuscular disorder (HCC)    fibromyalgia   Neuropathy    Osteoporosis 01/2019   T score -2.2 stable/improved from  prior study   Pelvic pain    Personal history of radiation therapy    Pneumonia    S/P radiation therapy 11/12/12 - 12/05/12   right Breast   Sepsis (HCC) 2014   from UTI    Sinus headache    Sjogren's disease (HCC)    Urgency of urination    Past Surgical History:  Procedure Laterality Date   71 HOUR PH STUDY N/A 04/03/2016   Procedure: 24 HOUR PH STUDY;  Surgeon: Ruffin Frederick, MD;  Location: [facility];  Service: Gastroenterology;  Laterality: N/A;   BREAST BIOPSY Right 08/23/2012   ADH   BREAST BIOPSY Right 10/11/2012   Ductal Carcinoma   BREAST EXCISIONAL BIOPSY Right 09/04/2013   benign   BREAST LUMPECTOMY Right 10/11/2012   W/ SLN BX   CARDIAC CATHETERIZATION  09-13-2007  DR Riley Kill   WELL-PRESERVED LVF/  DIFFUSE SCATTERED CORONARY CALCIFACATION AND ATHEROSCLEROSIS WITHOUT OBSTRUCTION   CARDIAC CATHETERIZATION  08-04-2010  DR Clifton James   NON-OBSTRUCTIVE CAD/  pLAD 40%/  oLAD 30%/  mLAD 30%/  pRCA 30%/  EF 60%   CARDIOVASCULAR STRESS TEST  06-18-2012  DR McALHANY   LOW RISK NUCLEAR STUDY/  SMALL FIXED AREA OF MODERATELY DECREASED UPTAKE IN ANTEROSEPTAL WALL WHICH MAY BE ARTIFACTUAL/  NO ISCHEMIA/  EF 68%   COLONOSCOPY  09/29/2010   CYSTOSCOPY     CYSTOSCOPY WITH HYDRODISTENSION AND BIOPSY N/A 03/06/2014   Procedure: CYSTOSCOPY/HYDRODISTENSION/ INSTILATION OF MARCAINE AND PYRIDIUM;  Surgeon: Kathi Ludwig, MD;  Location: [facility] [place];  Service: Urology;  Laterality: N/A;   CYSTOSCOPY/URETEROSCOPY/HOLMIUM LASER/STENT PLACEMENT Left 03/21/2023   Procedure: CYSTOSCOPY LEFT RETROGRADE PYELOGRAM, LEFT URETEROSCOPY, HOLMIUM LASER LITHOTRIPSYM, AND LEFT URETERAL STENT PLACEMENT;  Surgeon: Rene Paci, MD;  Location: [facility];  Service: Urology;  Laterality: Left;  60 MINUTES   ESOPHAGEAL MANOMETRY N/A 04/03/2016   Procedure: ESOPHAGEAL MANOMETRY (EM);  Surgeon: Ruffin Frederick, MD;  Location: [facility];  Service: Gastroenterology;   Laterality: N/A;   EXTRACORPOREAL SHOCK WAVE LITHOTRIPSY Left 02/06/2019   Procedure: EXTRACORPOREAL SHOCK WAVE LITHOTRIPSY (ESWL);  Surgeon: Ihor Gully, MD;  Location: [facility];  Service: Urology;  Laterality: Left;   NASAL SINUS SURGERY  1985   ORIF RIGHT ANKLE  FX  2006   POLYPECTOMY     REMOVAL VOCAL CORD CYST  01/2013   RIGHT BREAST BX  08/23/2012   RIGHT HAND SURGERY  X3  LAST ONE 2009   INCLUDES  ORIF RIGHT 5TH FINGER AND REVISION TWICE   SKIN CANCER EXCISION     face,arms   TONSILLECTOMY AND ADENOIDECTOMY  AGE 78   TOTAL ABDOMINAL HYSTERECTOMY W/ BILATERAL SALPINGOOPHORECTOMY  1982   W/  APPENDECTOMY   TRANSTHORACIC ECHOCARDIOGRAM  06/24/2012   GRADE I DIASTOLIC DYSFUNCTION/  EF 55-60%/  MILD MR   UPPER GASTROINTESTINAL ENDOSCOPY     Patient Active Problem List   Diagnosis Date Noted   Multiple drug allergies 05/09/2023   Recurrent infections 05/09/2023   Rash and other nonspecific skin eruption 05/09/2023   Local reaction to hymenoptera sting 05/09/2023   Allergic reaction 05/09/2023   Other adverse food reactions, not elsewhere classified, subsequent encounter 05/09/2023   Moderate persistent asthma without complication 05/09/2023   Other allergic rhinitis 05/09/2023   Weight loss 05/01/2023   Strep pharyngitis 04/24/2023   Common bile duct dilatation 02/13/2023   Oral herpes 02/05/2023   Tongue lesion 02/05/2023   Drug reaction 02/05/2023   Cellulitis of breast 02/05/2023   Elevated alkaline phosphatase level 01/25/2023   Facial trauma 01/22/2023   Vertigo 01/22/2023   Intertrigo 12/13/2022   Hyperkalemia 09/28/2022   Anemia 09/28/2022   Tremor 07/05/2022   RUQ pain 07/05/2022   Leukocytosis 07/05/2022   Chronic renal insufficiency 03/21/2022   Sjogren's disease (HCC) 12/12/2021   Vocal cord cyst 05/18/2021   High serum vitamin D 05/18/2021   Dysuria 03/09/2021   Vitamin B12 deficiency 03/08/2021   Preventative health care 03/08/2021   Hyperglycemia  03/08/2021   Low back pain 09/08/2020   OSA and COPD overlap syndrome (HCC) 06/10/2020   Recurrent falls 05/02/2020   Statin intolerance 02/29/2020   RLS (restless legs syndrome) 11/09/2019   Kidney stone 02/26/2019   Recurrent sinusitis 02/25/2019   Hx of colonic polyp 02/25/2019   DDD (degenerative disc disease), cervical 09/17/2018   Degenerative scoliosis 04/22/2018   Primary insomnia 06/25/2017   Chronic interstitial cystitis 02/01/2017   Chronic cough 01/09/2017   Right arm pain 07/20/2016   Deviated septum 05/12/2016   Nasal turbinate hypertrophy 05/12/2016  Dysphagia    Allergic rhinitis 01/25/2016   Other and combined forms of senile cataract 07/15/2015   Dyspnea    Left hip pain 04/30/2015   Sciatica 04/30/2015   Knee pain, right 04/30/2015   Bilateral carpal tunnel syndrome 03/04/2015   Hyperlipidemia 03/01/2015   Pulmonary nodules 02/12/2015   External hemorrhoids 02/05/2015   Chronic night sweats 01/08/2015   Atypical chest pain 01/01/2015   Memory loss 05/22/2014   Peripheral neuropathy 05/22/2014   Abdominal pain 10/26/2013   Headache 10/13/2013   Arthralgia 08/18/2013   ANA positive 07/29/2013   Depression 02/05/2013   Family history of malignant hyperthermia 02/05/2013   Malignant neoplasm of lower-outer quadrant of right breast of female, estrogen receptor positive (HCC) 01/03/2013   Splenic lesion 09/02/2012   Asthma, allergic 08/05/2012   Acute bronchitis with COPD (HCC) 06/03/2012   GERD (gastroesophageal reflux disease) 06/03/2012   Edema 04/08/2012   Stricture and stenosis of esophagus 10/19/2011   Functional constipation 07/21/2011   Nonspecific (abnormal) findings on radiological and other examination of biliary tract 07/21/2011   Osteoporosis 04/17/2011   Leg pain 02/24/2011   FIBROCYSTIC BREAST DISEASE, HX OF 10/18/2010   Essential hypertension 08/25/2010   CAD in native artery 08/25/2010   TOBACCO ABUSE, HX OF 08/25/2010   GENERALIZED  ANXIETY DISORDER 08/03/2010   Breast pain 01/11/2010   Fibromyalgia 01/11/2010    ONSET DATE: 04/24/23 REFERRING DIAG:  R42 (ICD-10-CM) - Dizzy spells  R53.1 (ICD-10-CM) - Weakness   THERAPY DIAG:  Dizziness and giddiness  Unsteadiness on feet  BPPV (benign paroxysmal positional vertigo), left  Chronic bilateral low back pain without sciatica  Muscle weakness (generalized)  Cervicalgia  Cramp and spasm  Other low back pain  Other lack of coordination  Generalized abdominal pain  Rationale for Evaluation and Treatment: Rehabilitation  SUBJECTIVE:                                                                                                                                                                                           SUBJECTIVE STATEMENT: Patient reports she has not had any dizziness since Wednesday, states no dizziness today. Patient states she is still tender along outside of L foot with someswelling but it is much better. Patient states she continues to have pain along R posterior hip/low back.   Pt accompanied by: self   PERTINENT HISTORY: chronic back pain, (scheduled for back ablation in June 2024), h/o vertigo PAIN:  Are you having pain? Yes: NPRS scale: 7/10 Pain location: low back  Pain description: chronic Aggravating factors: PT will monitor Relieving factors: *has upcoming intervention scheduled PRECAUTIONS: Fall WEIGHT  BEARING RESTRICTIONS: No FALLS: Has patient fallen in last 6 months? Yes. Number of falls 1.  LIVING ENVIRONMENT: Lives with: lives with their spouse Lives in: House/apartment Stairs: One step with one rail PLOF: Independent with basic ADLs-- notes declining mobility.  PATIENT GOALS: "stop the spinning and be able to walk and not falter."  OBJECTIVE:  (Measures in this section from initial evaluation unless otherwise noted) COGNITION: Overall cognitive status:  Patient mentions memory changes since first fall where she  hit her head -- she is unable to recall date SENSATION:  Patient reports numbness and cold sensations in feet. LOWER EXTREMITY MMT:   MMT Right Eval Left Eval  Hip flexion 3+/5 4/5  Knee flexion 4/5 4/5  Knee extension 5/5 5/5  Ankle dorsiflexion 3+/5 3+/5  Ankle plantarflexion    Ankle inversion    Ankle eversion    (Blank rows = not tested) BED MOBILITY:  Sit to supine Complete Independence Supine to sit Complete Independence Rolling to Right Complete Independence Rolling to Left Complete Independence *notes "I'm in a fog" with returning to sitting GAIT: Gait pattern:  slowed pace with SPC and wide BOS Distance walked: 100 ft Assistive device utilized: Single point cane FUNCTIONAL TESTS:  Berg= 40/56 5 time sit to stand=20.46 seconds with Ues on legs 37M=0.64 m/sec or 2.10 ft/sec PATIENT SURVEYS:  FOTO n/a--not measured  due to vertigo/balance  VESTIBULAR ASSESSMENT:  GENERAL OBSERVATION: Gets intermittent dizziness SYMPTOM BEHAVIOR:  Subjective history: Has intermittent h/o vertigo  Non-Vestibular symptoms:  none  Type of dizziness: Spinning/Vertigo, Unsteady with head/body turns, and Lightheadedness/Faint  Frequency: daily   Duration: seconds to minutes  Aggravating factors: Worse in the morning and with getting up after sitting for longer periods  Relieving factors: unsure  Progression of symptoms: unchanged OCULOMOTOR EXAM:  Ocular Alignment: normal  Ocular ROM: No Limitations  Spontaneous Nystagmus: absent  Gaze-Induced Nystagmus: absent  Smooth Pursuits: intact but notes target seems "out of reach"   Saccades: intact VESTIBULAR - OCULAR REFLEX:   Slow VOR: Comment: 2 reps provokes a sensation of bad spinning  Head-Impulse Test:  Did not test today due to inability to tolerate slow VOR  Dynamic Visual Acuity: to be assessed POSITIONAL TESTING: Right Dix-Hallpike: no nystagmus Left Dix-Hallpike: no nystagmus Right Roll Test: TBA Left Roll Test:  TBA Right Sidelying: no nystagmus Left Sidelying: no nystagmus *Notes symptoms with return to sitting.   [facility] Adult PT Treatment:                                                DATE: 05/18/2023 Therapeutic Exercise: STS (knee valgus) Seated unilateral clamshell 2x10 RTB Neuromuscular re-ed: Habituation: seated yaw & pitch HT 5x30" each Walking + yaw HT every 3 steps (dizzy with turning directions towards R) Standing 180 degree turns L/R (dizzy w/turns to R) Habituation: sit <--> supine rolling to R (discontinued d/t hip pain)    [facility] Adult PT Treatment:                                                DATE: 05/16/23 Therapeutic Exercise: Supine Towel roll under sacral region for gentle stretch Marching x 12 reps Gentle rocking lateral rotation Bent knee fallouts with pain  L hip Decompression exercises with ball push x 5 second holds x 5 reps Sitting Sit<>stand x 5 reps with cues to power up x 3 sets Neuromuscular re-ed: Notes some spinning with return to sitting after therapeutic ex Habituation: with seated horiz head turns x 3 reps before getting shooting HA Habituation vertical head turns Gaze x 1 attempted with difficulty-- switched to seated slow habituation with eyes and head moving Standing wall bumps x 5 reps Heel raises Reaching in stride position for target on wall Self Care: Patient uses "Better Me" wellness platform and is doing chair yoga  PATIENT EDUCATION: Education details: goals of physical therapy Person educated: Patient Education method: Explanation Education comprehension: verbalized understanding  HOME EXERCISE PROGRAM: Access Code: [email or web address] URL: [email or web address] Date: 05/18/2023 Prepared by: Carlynn Herald  Exercises - Sit to Stand with Armchair  - 2 x daily - 7 x weekly - 1 sets - 5 reps - Seated habituation head turns  - 2 x daily - 7 x weekly - 1 sets - 3 reps - Seated Single Leg Hip Abduction with Resistance  - 1 x daily - 7  x weekly - 3 sets - 10 reps  GOALS: Goals reviewed with patient? Yes  SHORT TERM GOALS: Target date: 06/06/23  Patient will be independent with initial HEP.  Baseline: To be given Goal status: INITIAL  2.  Patient will complete Berg, and FGA if indicated. Baseline: 40/56 (Berg) Goal status: MET   3.  Patient will report decreased dizziness by 50%. Baseline: not finished.  Goal status: INITIAL  LONG TERM GOALS:  Target date: 07/05/23  Patient will be independent with advanced/ongoing HEP to improve outcomes and carryover.  Baseline: to be established. Goal status: INITIAL  2.  Patient will improve Berg balance score by 5 points to demo improved functional balance.  Baseline: 40/56  Goal status: INITIAL  3. Patient will demo floor<>stand with UE support mod indep for improved functional strength.  Baseline: has h/o falls.  Goal status: INITIAL  4. Patient will improve gait speed to 2.8 ft/sec to demo improving mobility and dec'd risk for falls.  Baseline: 2.10 ft/sec  Goal status: INITIAL  ASSESSMENT:  CLINICAL IMPRESSION: Habituation exercises continued; initial increased dizziness with repetitive head turns in yaw plane, however over time symptoms decreased to minimal/none. Noted knee vlagus during sit to stand exercise; resisted unilateral hip abd performed to address weakness deficits.    OBJECTIVE IMPAIRMENTS: Abnormal gait, decreased activity tolerance, decreased balance, decreased strength, dizziness, impaired sensation, and pain.   PLAN:  PT FREQUENCY: 2x/week  PT DURATION: 8 weeks  PLANNED INTERVENTIONS: Therapeutic exercises, Therapeutic activity, Neuromuscular re-education, Balance training, Gait training, Patient/Family education, Self Care, Joint mobilization, Vestibular training, Canalith repositioning, Aquatic Therapy, Dry Needling, Electrical stimulation, Moist heat, Taping, and Manual therapy  PLAN FOR NEXT SESSION: Progress gait activities to  tolerance and habituation as indicated.  Sanjuana Mae, PTA 05/18/2023 10:18 AM

## 2023-05-21 NOTE — Progress Notes (Signed)
HPI :  77 year old female here for a follow-up visit.  Recall she has a history of chronic constipation, abdominal pain for which she follows with [place].  She is also has a history of COPD, CAD, a flutter, breast cancer status post right breast lumpectomy and radiation in 2013, interstitial cystitis.  She is here today for abdominal pain and constipation.  She states she has had chronic constipation but lately she has been moving her bowels perhaps once every 7 to 8 days.  In between then she has a lot of spasms and soreness and bloating in her lower abdomen to left lower quadrant.  She does have a lot of bloating and gas that can bother her with this.  She currently takes MiraLAX once daily.  Despite this she still has infrequent bowel movements.  She does have hard stool and can strain, had to use manual maneuvers once to help get stool out.  She states that is very rare.  She has used some enemas in the past.  She recalls she has seen pelvic floor PT in the past for this which she did find helpful but then COVID happened and she stopped going to them.  She would be interested in seeing them again.  She does think that Bentyl can help with some of the spasms however she takes it routinely a few times daily.  She has been on chronic Cymbalta and also has used some IBgard as needed which she does state can help with her pain as well.  She had a CT scan on March 4 for some of the symptoms which did not show any concerning pathology.  Small renal stone.  She has had some basic lab work in May which otherwise looked okay.    PAST GI PROCEDURES:    EGD 12/24/2018:  - Esophagogastric landmarks identified. - Normal esophagus - empiric dilation performed to 17mm with small mucosal wrent at the UES - Erythematous mucosa in the gastric body. - Normal duodenal bulb and second portion of the duodenum. - Biopsies were taken with a cold forceps for Helicobacter pylori testing.   Colonoscopy 12/24/2018: - One  4 mm polyp in the ascending colon, removed with a cold snare. Resected and retrieved. - One 4 mm polyp in the transverse colon, removed with a cold snare. Resected and retrieved. - One 4 mm polyp in the descending colon, removed with a cold snare. Resected and retrieved. - One 3 mm polyp in the sigmoid colon, removed with a cold snare. Resected and retrieved. - Anal papilla(e) were hypertrophied. Biopsied. - Tortuous colon, which prohibited ileal intubation. - Internal hemorrhoids. - Mostly adequate prep, fair in the cecum. - The examination was otherwise normal. No evidence of IBD or fistula. Suspect rectal bleeding is due to hemorrhoids in the setting of constipation. -3-year colonoscopy recall to be considered as the patient would be 75 at that point   1. Surgical [P], gastric antrum and gastric body - MILD REACTIVE GASTROPATHY. - NEGATIVE FOR HELICOBACTER PYLORI. - NO INTESTINAL METAPLASIA, DYSPLASIA, OR MALIGNANCY. 2. Surgical [P], transverse, ascending, sigmoid, polyp (2) - TUBULAR ADENOMA. - SESSILE SERRATED POLYP WITHOUT DYSPLASIA (X2 FRAGMENTS). - NO HIGH GRADE DYSPLASIA OR MALIGNANCY. 3. Surgical [P], anal papilla, polyp - POLYPOID FRAGMENT OF BENIGN SQUAMOUS MUCOSA. - NO DYSPLASIA OR MALIGNANCY.   EGD 03/22/22: - Esophagogastric landmarks were identified: the Z-line was found at 39 cm, the gastroesophageal junction was found at 39 cm and the upper extent of the gastric folds  was found at 40 cm from the incisors. Findings: - A 1 cm hiatal hernia was present. - The exam of the esophagus was otherwise normal. - A guidewire was placed and the scope was withdrawn. Empiric dilation was performed in the entire esophagus with a Savary dilator with mild resistance at 15 mm and then 17 mm (initially passage of 17mm felt tight, so started with 15mm first). Relook endoscopy showed appropriate small mucosal wrent just inferior to the UES. - The entire examined stomach was normal. - The  duodenal bulb and second portion of the duodenum were normal.  Colonoscopy 03/02/22: - The perianal and digital rectal examinations were normal. - A large amount of semi-liquid stool was found in the ascending colon and in the cecum, making visualization difficult initially. Lavage of the area was performed using copious amounts of sterile water, resulting in clearance with adequate visualization. - Anal papilla(e) were hypertrophied. - Internal hemorrhoids were found during retroflexion. The hemorrhoids were small. - The colon was tortuous with looping. Cecal intubation was technically challenging. - The exam was otherwise without abnormality. No overt evidence of fistula in the colon. No inflammatory changes - Biopsies for histology were taken with a cold forceps from the right colon, left colon and transverse colon for evaluation of microscopic colitis.   CT abdomen / pelvis 02/27/2023: IMPRESSION: 3 mm proximal left ureteral calculus, without hydronephrosis. No other acute findings.   Aortic Atherosclerosis (ICD10-I70.0).   Past Medical History:  Diagnosis Date   Allergy    Anemia    Anxiety    past hx    Arthritis    Asthma    Atrial flutter (HCC)    past history- not current   Breast cancer (HCC)    CAD (coronary artery disease) CARDIOLOGIST--  DR Clifton James   mild non-obstructive cad   Cancer (HCC)    right   Cataract    bilaterally removed    Chronic constipation    Chronic kidney disease    stage 3 ckd, interstitial cystitis   Concussion    x 3   COPD (chronic obstructive pulmonary disease) (HCC)    Depression    past hx    Dyspnea    Dysrhythmia    Family history of adverse reaction to anesthesia    Family history of malignant hyperthermia    father had this   Fibromyalgia    Frequency of urination    GERD (gastroesophageal reflux disease)    H/O hiatal hernia    History of basal cell carcinoma excision    X2   History of breast cancer ONCOLOGIST-- DR Darnelle Catalan---   NO RECURRANCE   DX 07/2012;  LOW GRADE DCIS  ER+PR+  ----  S/P RIGHT LUMPECTOMY WITH NEGATIVE MARGINS/   RADIATION ENDED 11/2012   History of chronic bronchitis    History of colonic polyps    History of tachycardia    CONTROLLED  WITH ATENOLOL   Hyperlipidemia    Hypertension    Neuromuscular disorder (HCC)    fibromyalgia   Neuropathy    Osteoporosis 01/2019   T score -2.2 stable/improved from prior study   Pelvic pain    Personal history of radiation therapy    Pneumonia    S/P radiation therapy 11/12/12 - 12/05/12   right Breast   Sepsis (HCC) 2014   from UTI    Sinus headache    Sjogren's disease (HCC)    Urgency of urination      Past Surgical History:  Procedure Laterality Date   91 HOUR PH STUDY N/A 04/03/2016   Procedure: 24 HOUR PH STUDY;  Surgeon: Ruffin Frederick, MD;  Location: [facility];  Service: Gastroenterology;  Laterality: N/A;   BREAST BIOPSY Right 08/23/2012   ADH   BREAST BIOPSY Right 10/11/2012   Ductal Carcinoma   BREAST EXCISIONAL BIOPSY Right 09/04/2013   benign   BREAST LUMPECTOMY Right 10/11/2012   W/ SLN BX   CARDIAC CATHETERIZATION  09-13-2007  DR Riley Kill   WELL-PRESERVED LVF/  DIFFUSE SCATTERED CORONARY CALCIFACATION AND ATHEROSCLEROSIS WITHOUT OBSTRUCTION   CARDIAC CATHETERIZATION  08-04-2010  DR MCALHANY   NON-OBSTRUCTIVE CAD/  pLAD 40%/  oLAD 30%/  mLAD 30%/  pRCA 30%/  EF 60%   CARDIOVASCULAR STRESS TEST  06-18-2012  DR McALHANY   LOW RISK NUCLEAR STUDY/  SMALL FIXED AREA OF MODERATELY DECREASED UPTAKE IN ANTEROSEPTAL WALL WHICH MAY BE ARTIFACTUAL/  NO ISCHEMIA/  EF 68%   COLONOSCOPY  09/29/2010   CYSTOSCOPY     CYSTOSCOPY WITH HYDRODISTENSION AND BIOPSY N/A 03/06/2014   Procedure: CYSTOSCOPY/HYDRODISTENSION/ INSTILATION OF MARCAINE AND PYRIDIUM;  Surgeon: Kathi Ludwig, MD;  Location: [facility];  Service: Urology;  Laterality: N/A;   CYSTOSCOPY/URETEROSCOPY/HOLMIUM LASER/STENT PLACEMENT Left 03/21/2023    Procedure: CYSTOSCOPY LEFT RETROGRADE PYELOGRAM, LEFT URETEROSCOPY, HOLMIUM LASER LITHOTRIPSYM, AND LEFT URETERAL STENT PLACEMENT;  Surgeon: Rene Paci, MD;  Location: [facility];  Service: Urology;  Laterality: Left;  60 MINUTES   ESOPHAGEAL MANOMETRY N/A 04/03/2016   Procedure: ESOPHAGEAL MANOMETRY (EM);  Surgeon: Ruffin Frederick, MD;  Location: [facility];  Service: Gastroenterology;  Laterality: N/A;   EXTRACORPOREAL SHOCK WAVE LITHOTRIPSY Left 02/06/2019   Procedure: EXTRACORPOREAL SHOCK WAVE LITHOTRIPSY (ESWL);  Surgeon: Ihor Gully, MD;  Location: [facility];  Service: Urology;  Laterality: Left;   NASAL SINUS SURGERY  1985   ORIF RIGHT ANKLE  FX  2006   POLYPECTOMY     REMOVAL VOCAL CORD CYST  01/2013   RIGHT BREAST BX  08/23/2012   RIGHT HAND SURGERY  X3  LAST ONE 2009   INCLUDES  ORIF RIGHT 5TH FINGER AND REVISION TWICE   SKIN CANCER EXCISION     face,arms   TONSILLECTOMY AND ADENOIDECTOMY  AGE 34   TOTAL ABDOMINAL HYSTERECTOMY W/ BILATERAL SALPINGOOPHORECTOMY  1982   W/  APPENDECTOMY   TRANSTHORACIC ECHOCARDIOGRAM  06/24/2012   GRADE I DIASTOLIC DYSFUNCTION/  EF 55-60%/  MILD MR   UPPER GASTROINTESTINAL ENDOSCOPY     Family History  Problem Relation Age of Onset   Rectal cancer Mother    Colon cancer Mother    Pancreatic cancer Mother    Diabetes Mother    Breast cancer Mother 32   Breast cancer Maternal Aunt        breast   Birth defects Maternal Aunt    Irritable bowel syndrome Son    Heart disease Son        CAD, MV replacement   Allergic Disorder Daughter    Diabetes Daughter    Colon cancer Father    Colon polyps Father    Diabetes Father    Stroke Father    Heart disease Father        CHF   Hyperlipidemia Father    Hypertension Father    Arthritis Father    Breast cancer Paternal Aunt    Breast cancer Paternal Aunt    Arthritis Paternal Uncle    Allergic Disorder Daughter    Heart disease Cousin  CAD, had a blood clot after  stenting   Esophageal cancer Neg Hx    Stomach cancer Neg Hx    Social History   Tobacco Use   Smoking status: Former    Packs/day: 1.00    Years: 15.00    Additional pack years: 0.00    Total pack years: 15.00    Types: Cigarettes    Quit date: 05/27/2005    Years since quitting: 17.9   Smokeless tobacco: Never  Vaping Use   Vaping Use: Never used  Substance Use Topics   Alcohol use: No    Alcohol/week: 0.0 standard drinks of alcohol   Drug use: No   Current Outpatient Medications  Medication Sig Dispense Refill   acetaminophen (TYLENOL) 500 MG tablet Take 1,000 mg by mouth every 6 (six) hours as needed for mild pain or headache.      albuterol (VENTOLIN HFA) 108 (90 Base) MCG/ACT inhaler Inhale 2 puffs into the lungs every 4 (four) hours as needed for wheezing or shortness of breath (coughing fits). 18 g 1   amLODipine (NORVASC) 5 MG tablet Take 1 tablet (5 mg total) by mouth daily. 90 tablet 3   aspirin EC 81 MG tablet Take 1 tablet (81 mg total) by mouth daily. 90 tablet 3   atenolol (TENORMIN) 25 MG tablet Take 1 tablet (25 mg total) by mouth 2 (two) times daily. Take 1 tablet by mouth 2 time a day 180 tablet 3   benzonatate (TESSALON PERLES) 100 MG capsule Take 1-2 capsules (100-200 mg total) by mouth 3 (three) times daily as needed for cough. 60 capsule 0   CALCIUM PO Take 500 mg by mouth daily.     carboxymethylcellul-glycerin (OPTIVE) 0.5-0.9 % ophthalmic solution Place 1 drop into both eyes daily as needed for dry eyes.     Cholecalciferol (VITAMIN D3) 50 MCG (2000 UT) CAPS Take 2,000 Units by mouth daily.     cyanocobalamin (VITAMIN B12) 1000 MCG tablet Take 1,000 mg by mouth daily with breakfast.     diclofenac Sodium (VOLTAREN) 1 % GEL Apply 2 g topically daily as needed (Back pain).     dicyclomine (BENTYL) 10 MG capsule Take 1 capsule (10 mg total) by mouth 2 (two) times daily as needed for spasms. (Patient taking differently: Take 10 mg by mouth 3 (three) times a  week.) 30 capsule 0   diphenhydrAMINE (BENADRYL) 25 mg capsule Take 25 mg by mouth every 6 (six) hours as needed for itching.     Docusate Sodium (DSS) 100 MG CAPS Take 100 mg by mouth daily as needed (Constipation).     DULoxetine (CYMBALTA) 30 MG capsule Take 1 capsule (30 mg total) by mouth daily. 30 capsule 1   DULoxetine (CYMBALTA) 60 MG capsule Take 1 capsule by mouth twice daily 180 capsule 0   EPINEPHrine 0.3 mg/0.3 mL IJ SOAJ injection Inject 0.3 mg into the muscle as needed for anaphylaxis. 2 each 1   famotidine (PEPCID) 20 MG tablet Take 1 tablet (20 mg total) by mouth 2 (two) times daily as needed for heartburn or indigestion. 60 tablet 1   fluticasone (FLONASE) 50 MCG/ACT nasal spray Use 2 spray(s) in each nostril once daily (Patient taking differently: Place 2 sprays into both nostrils daily as needed for allergies or rhinitis.) 16 g 5   fluticasone (FLOVENT HFA) 110 MCG/ACT inhaler Inhale 2 puffs into the lungs 2 (two) times daily. (Patient taking differently: Inhale 2 puffs into the lungs daily as needed (  Short of breath).) 1 each 5   folic acid (FOLVITE) 1 MG tablet Take 1 tablet (1 mg total) by mouth daily.     furosemide (LASIX) 20 MG tablet Take 1 tablet (20 mg total) by mouth as needed for edema. 90 tablet 3   gabapentin (NEURONTIN) 300 MG capsule Take 300-900 mg by mouth See admin instructions. Take 600 mg in the morning 300 mg at lunch time and 900 mg at bedtime     hydroxychloroquine (PLAQUENIL) 200 MG tablet Take 200 mg by mouth daily.     hydrOXYzine (ATARAX) 10 MG tablet Take 1 tablet (10 mg total) by mouth 3 (three) times daily as needed. 30 tablet 1   loratadine (CLARITIN) 10 MG tablet Take 10 mg by mouth daily as needed for rhinitis.     Multiple Vitamins-Minerals (MULTIVITAMIN WITH MINERALS) tablet Take 1 tablet by mouth daily. (Patient taking differently: Take 1 tablet by mouth daily. 50+)     nitroGLYCERIN (NITROSTAT) 0.4 MG SL tablet Place 1 tablet (0.4 mg total)  under the tongue every 5 (five) minutes as needed for chest pain. 25 tablet 1   nystatin cream (MYCOSTATIN) Apply 1 application topically 2 (two) times daily. (Patient taking differently: Apply 1 application  topically daily as needed (vaginal yeast).) 30 g 2   pantoprazole (PROTONIX) 40 MG tablet Take 1 tablet (40 mg total) by mouth daily. 90 tablet 1   pentosan polysulfate (ELMIRON) 100 MG capsule Take 1 capsule (100 mg total) by mouth 3 (three) times daily. Reported on 01/05/2016 (Patient taking differently: Take 100 mg by mouth daily as needed (burning and pain). Reported on 01/05/2016) 90 capsule 11   polyethylene glycol (MIRALAX) 17 g packet Take 17 g by mouth daily. (Patient taking differently: Take 17 g by mouth daily as needed for mild constipation or moderate constipation.) 14 each 0   REPATHA SURECLICK 140 MG/ML SOAJ INJECT 140 MG INTO THE SKIN EVERY 14 DAYS 6 mL 0   sucralfate (CARAFATE) 1 GM/10ML suspension Take 10 mLs (1 g total) by mouth 4 (four) times daily -  with meals and at bedtime. (Patient taking differently: Take 1 g by mouth 2 (two) times daily.) 420 mL 1   traMADol (ULTRAM) 50 MG tablet Take 25 mg by mouth 2 (two) times daily.     traZODone (DESYREL) 100 MG tablet Take 1 tablet (100 mg total) by mouth at bedtime. 90 tablet 1   Vitamin D, Ergocalciferol, (DRISDOL) 1.25 MG (50000 UNIT) CAPS capsule Take 1 capsule (50,000 Units total) by mouth every 7 (seven) days. 12x Weeks 5 capsule 3   No current facility-administered medications for this visit.   Allergies  Allergen Reactions   Clindamycin Hcl Shortness Of Breath and Rash   Penicillins Anaphylaxis   Rosuvastatin Anaphylaxis   Baclofen Other (See Comments) and Rash   Lincomycin Other (See Comments)   Prednisone Rash    Other reaction(s): Rash, Hives - can tolerate if she takes with benadryl    Codeine Hives and Other (See Comments)    headache Other reaction(s): Headache, Nausea, headache   Doxycycline     Skin rash    Erythromycin Hives   Haemophilus Influenzae Other (See Comments)    Local reaction at the site Local reaction at the site   Haemophilus Influenzae Vaccines Other (See Comments)    Local reaction at site   Latex Hives   Levofloxacin     Other reaction(s): sick   Other Other (See Comments)  Father had malignant hyperthermia   Pentazocine Lactate Other (See Comments)    HALLUCINATION   Pneumococcal Vaccine Polyvalent Hives, Swelling and Other (See Comments)    REACTION: redness, swelling, and hives at injection site   Tamoxifen Nausea And Vomiting and Other (See Comments)    HEADACHE Other reaction(s): Headache, Nausea   Fluzone [Influenza Virus Vaccine] Hives    Local reaction at the site     Review of Systems: All systems reviewed and negative except where noted in HPI.   Lab Results  Component Value Date   WBC 7.8 05/01/2023   HGB 12.0 05/01/2023   HCT 37.4 05/01/2023   MCV 87.3 05/01/2023   PLT 402.0 (H) 05/01/2023    Lab Results  Component Value Date   CREATININE 1.05 05/01/2023   BUN 13 05/01/2023   NA 138 05/01/2023   K 4.7 05/01/2023   CL 103 05/01/2023   CO2 28 05/01/2023    Lab Results  Component Value Date   ALT 7 05/01/2023   AST 13 05/01/2023   GGT 11 03/30/2022   ALKPHOS 110 05/01/2023   BILITOT 0.4 05/01/2023     Physical Exam: BP 106/74   Pulse 88   Ht 5' 4.5" (1.638 m)   Wt 126 lb (57.2 kg)   BMI 21.29 kg/m  Constitutional: Pleasant,well-developed, female in no acute distress. Abdominal: Soft, nondistended, nontender.  There are no masses palpable. Neurological: Alert and oriented to person place and time. Psychiatric: Normal mood and affect. Behavior is normal.   ASSESSMENT: 77 y.o. female here for assessment of the following  1. Chronic constipation   2. Lower abdominal pain    Recent labs and CT scan reassuring.  Colonoscopy is up-to-date.  Her lower abdominal pain and bloating is very likely secondary to her chronic  constipation which has worsened now compared to previous.  We discussed her regimen and options.  I would like to increase her MiraLAX to twice daily and she can titrate that up or down as tolerated.  In addition, I will give her some samples of Linzess, 145 mcg/day to use as needed.  If she finds this is useful then she can contact us for a prescription.  She reminds me she has seen pelvic floor PT in the past for this remotely and it did help, but she was lost to follow-up during COVID.  I will refer her back to pelvic floor PT and will see if this helps as well.  Otherwise I provided some reassurance to her in regards to her recent imaging, colonoscopy, and labs.  In addition, she is using Bentyl routinely which could be worsening constipation.  Recommend she stop that.  She should use IBgard as needed for bloating/pain, I think if she starts moving her bowels more frequently she will start feeling better soon.  She will keep me updated on her progress, we may need to tweak her regimen over time pending her response/results. She agreed,   PLAN: - increase miralax to BID - samples of Linzess 155mcg/ day - to use PRN. Call if she wants to have a prescription. - refer her to pelvic floor PT - has seen remotely and helped in the past - stop Bentyl/ dicyclomine, could be making constipation worse - use IB gard PRN or scheduled  - reassured her of normal CT, labs, and colonoscopy  Harlin Rain, MD [facility]

## 2023-05-22 ENCOUNTER — Telehealth: Payer: Self-pay

## 2023-05-22 ENCOUNTER — Encounter: Payer: Self-pay | Admitting: Gastroenterology

## 2023-05-22 ENCOUNTER — Ambulatory Visit: Payer: Medicare Other | Admitting: Gastroenterology

## 2023-05-22 ENCOUNTER — Other Ambulatory Visit: Payer: Self-pay | Admitting: Family Medicine

## 2023-05-22 VITALS — BP 106/74 | HR 88 | Ht 64.5 in | Wt 126.0 lb

## 2023-05-22 DIAGNOSIS — R1030 Lower abdominal pain, unspecified: Secondary | ICD-10-CM | POA: Diagnosis not present

## 2023-05-22 DIAGNOSIS — K5909 Other constipation: Secondary | ICD-10-CM

## 2023-05-22 MED ORDER — POLYETHYLENE GLYCOL 3350 17 G PO PACK: 17.0000 g | PACK | Freq: Two times a day (BID) | ORAL | 0 refills | Status: AC

## 2023-05-22 MED ORDER — SULFAMETHOXAZOLE-TRIMETHOPRIM 800-160 MG PO TABS
1.0000 | ORAL_TABLET | Freq: Two times a day (BID) | ORAL | 0 refills | Status: DC
Start: 2023-05-22 — End: 2023-06-04

## 2023-05-22 MED ORDER — LINACLOTIDE 145 MCG PO CAPS: 145.0000 ug | ORAL_CAPSULE | Freq: Every day | ORAL | 0 refills | Status: DC | PRN

## 2023-05-22 NOTE — Telephone Encounter (Signed)
Called pt was advised medication was sent. Pt stated understand

## 2023-05-22 NOTE — Telephone Encounter (Signed)
Initial Comment Caller states she finished her medication for strep and states she is still not feeling better. States she has a sore throat, congestion, cough. Translation No Nurse Assessment Nurse: Doylene Canard, RN, Lesa Date/Time (Eastern Time): 05/21/2023 8:38:52 AM Confirm and document reason for call. If symptomatic, describe symptoms. ---Caller states she has a sore throat and a productive cough. She finished Cefdinir on 05/11/23 Does the patient have any new or worsening symptoms? ---Yes Will a triage be completed? ---Yes Related visit to physician within the last 2 weeks? ---No Does the PT have any chronic conditions? (i.e. diabetes, asthma, this includes High risk factors for pregnancy, etc.) ---Yes List chronic conditions. ---sinus problems, constipation and hx of breast CA Is this a behavioral health or substance abuse call? ---No Guidelines Guideline Title Affirmed Question Affirmed Notes Nurse Date/Time Lamount Cohen Time) Sore Throat Earache also present Conner, RN, Lesa 05/21/2023 8:42:25 AM Disp. Time Lamount Cohen Time) Disposition Final User 05/21/2023 8:45:56 AM See PCP within 24 Hours Yes Doylene Canard, RN, Gean Maidens Final Disposition 05/21/2023 8:45:56 AM See PCP within 24 Hours Yes Conner, RN, Lesa PLEASE NOTE: All timestamps contained within this report are represented as [place] Standard Time. CONFIDENTIALTY NOTICE: This fax transmission is intended only for the addressee. It contains information that is legally privileged, confidential or otherwise protected from use or disclosure. If you are not the intended recipient, you are strictly prohibited from reviewing, disclosing, copying using or disseminating any of this information or taking any action in reliance on or regarding this information. If you have received this fax in error, please notify us immediately by telephone so that we can arrange for its return to [place]. Phone: [phone], Toll-Free: [phone], Fax: [phone] Page:  2 of 2 Call Id: [id number] Caller Disagree/Comply Comply Caller Understands Yes PreDisposition Home Care Care Advice Given Per Guideline SEE PCP WITHIN 24 HOURS: * IF OFFICE WILL BE OPEN: You need to be examined within the next 24 hours. Call your doctor (or NP/PA) when the office opens and make an appointment. * Sip warm chicken broth or apple juice. SORE THROAT: * Suck on hard candy or an over-the-counter throat lozenge. * Gargle with warm salt water four times a day. To make salt water, put 1/2 teaspoon of salt in 8 oz (240 ml) of warm water. * Here are some simple things you can do to treat and reduce sore throat pain. * Avoid cigarette smoke. PAIN AND FEVER MEDICINES: * For pain or fever relief, take either acetaminophen or ibuprofen. * ACETAMINOPHEN - REGULAR STRENGTH TYLENOL: Take 650 mg (two 325 mg pills) by mouth every 4 to 6 hours as needed. Each Regular Strength Tylenol pill has 325 mg of acetaminophen. The most you should take is 10 pills a day (3,250 mg total). Note: In [place], the maximum is 12 pills a day (3,900 mg total). * Treat fevers above 101 F (38.3 C). The goal of fever therapy is to bring the fever down to a comfortable level. Remember that fever medicine usually lowers fever 2 degrees F (1 - 1 1/2 degrees C). PAIN AND FEVER MEDICINES - EXTRA NOTES AND WARNINGS: * Follow these dosing instructions unless your doctor (or NP/PA) has told you to take a different dose. * Before taking any medicine, read all the instructions on the package. SOFT DIET: * Eat a soft diet. * Cold drinks, popsicles, and milk shakes are especially good. Avoid citrus fruits. CALL BACK IF: * You become worse CARE ADVICE given per Sore Throat (Adult) guideline. Referrals  REFERRED TO PCP OFFICE

## 2023-05-22 NOTE — Telephone Encounter (Signed)
Pt called to advise that she finished her antibiotic and since Saturday she has started coughing again and has a sore throat again. Pt wants to know if something else can be called in for her or if she needs to come back in.

## 2023-05-22 NOTE — Patient Instructions (Addendum)
If your blood pressure at your visit was 140/90 or greater, please contact your primary care physician to follow up on this. ______________________________________________________  If you are age 78 or older, your body mass index should be between 23-30. Your Body mass index is 21.29 kg/m. If this is out of the aforementioned range listed, please consider follow up with your Primary Care Provider.  If you are age 76 or younger, your body mass index should be between 19-25. Your Body mass index is 21.29 kg/m. If this is out of the aformentioned range listed, please consider follow up with your Primary Care Provider.  ________________________________________________________  The [place] [facility] providers would like to encourage you to use [facility] to communicate with providers for non-urgent requests or questions.  Due to long hold times on the telephone, sending your provider a message by [facility] may be a faster and more efficient way to get a response.  Please allow 48 business hours for a response.  Please remember that this is for non-urgent requests.  _______________________________________________________  Due to recent changes in healthcare laws, you may see the results of your imaging and laboratory studies on MyChart before your provider has had a chance to review them.  We understand that in some cases there may be results that are confusing or concerning to you. Not all laboratory results come back in the same time frame and the provider may be waiting for multiple results in order to interpret others.  Please give [place] 48 hours in order for your provider to thoroughly review all the results before contacting the office for clarification of your results.   Stop Bentyl (dicyclomine).  Increase Miralax to twice a day  We have given you samples of the following medication to take: Linzess 145 mcg: Take daily 30 minutes before a meal as needed  Use IBgard over-the-counter as needed  We are  referring you to [facility].  They will contact you directly to schedule an appointment.  It may take a week or more before you hear from them.  Please feel free to contact us if you have not heard from them within 2 weeks and we will follow up on the referral.   Thank you for entrusting me with your care and for choosing [facility], Dr. Ileene Patrick

## 2023-05-23 ENCOUNTER — Encounter: Payer: Self-pay | Admitting: Rehabilitative and Restorative Service Providers"

## 2023-05-23 ENCOUNTER — Ambulatory Visit: Payer: Medicare Other | Admitting: Rehabilitative and Restorative Service Providers"

## 2023-05-23 DIAGNOSIS — R278 Other lack of coordination: Secondary | ICD-10-CM | POA: Diagnosis not present

## 2023-05-23 DIAGNOSIS — G8929 Other chronic pain: Secondary | ICD-10-CM | POA: Diagnosis not present

## 2023-05-23 DIAGNOSIS — H8112 Benign paroxysmal vertigo, left ear: Secondary | ICD-10-CM

## 2023-05-23 DIAGNOSIS — M5459 Other low back pain: Secondary | ICD-10-CM | POA: Diagnosis not present

## 2023-05-23 DIAGNOSIS — R531 Weakness: Secondary | ICD-10-CM | POA: Diagnosis not present

## 2023-05-23 DIAGNOSIS — M6281 Muscle weakness (generalized): Secondary | ICD-10-CM

## 2023-05-23 DIAGNOSIS — R1084 Generalized abdominal pain: Secondary | ICD-10-CM | POA: Diagnosis not present

## 2023-05-23 DIAGNOSIS — R42 Dizziness and giddiness: Secondary | ICD-10-CM | POA: Diagnosis not present

## 2023-05-23 DIAGNOSIS — M545 Low back pain, unspecified: Secondary | ICD-10-CM | POA: Diagnosis not present

## 2023-05-23 DIAGNOSIS — R252 Cramp and spasm: Secondary | ICD-10-CM | POA: Diagnosis not present

## 2023-05-23 DIAGNOSIS — M542 Cervicalgia: Secondary | ICD-10-CM | POA: Diagnosis not present

## 2023-05-23 DIAGNOSIS — R2681 Unsteadiness on feet: Secondary | ICD-10-CM

## 2023-05-23 NOTE — Therapy (Signed)
OUTPATIENT PHYSICAL THERAPY NEURO TREATMENT   Patient Name: [name] MRN: [id number] DOB:09-15-1946, 77 y.o., female Today's Date: 05/23/2023  PCP: Danise Edge, MD REFERRING PROVIDER: Danise Edge, MD  END OF SESSION:  PT End of Session - 05/23/23 1005     Visit Number 5    Number of Visits 16    Date for PT Re-Evaluation 07/05/23    Authorization Type BCBS medicare    Progress Note Due on Visit 10    PT Start Time 1011    PT Stop Time 1055    PT Time Calculation (min) 44 min    Equipment Utilized During Treatment Gait belt    Activity Tolerance Patient tolerated treatment well    Behavior During Therapy WFL for tasks assessed/performed            Past Medical History:  Diagnosis Date   Allergy    Anemia    Anxiety    past hx    Arthritis    Asthma    Atrial flutter (HCC)    past history- not current   Breast cancer (HCC)    CAD (coronary artery disease) CARDIOLOGIST--  DR Clifton James   mild non-obstructive cad   Cancer (HCC)    right   Cataract    bilaterally removed    Chronic constipation    Chronic kidney disease    stage 3 ckd, interstitial cystitis   Concussion    x 3   COPD (chronic obstructive pulmonary disease) (HCC)    Depression    past hx    Dyspnea    Dysrhythmia    Family history of adverse reaction to anesthesia    Family history of malignant hyperthermia    father had this   Fibromyalgia    Frequency of urination    GERD (gastroesophageal reflux disease)    H/O hiatal hernia    History of basal cell carcinoma excision    X2   History of breast cancer ONCOLOGIST-- DR Darnelle Catalan---  NO RECURRANCE   DX 07/2012;  LOW GRADE DCIS  ER+PR+  ----  S/P RIGHT LUMPECTOMY WITH NEGATIVE MARGINS/   RADIATION ENDED 11/2012   History of chronic bronchitis    History of colonic polyps    History of tachycardia    CONTROLLED  WITH ATENOLOL   Hyperlipidemia    Hypertension    Neuromuscular disorder (HCC)    fibromyalgia   Neuropathy     Osteoporosis 01/2019   T score -2.2 stable/improved from prior study   Pelvic pain    Personal history of radiation therapy    Pneumonia    S/P radiation therapy 11/12/12 - 12/05/12   right Breast   Sepsis (HCC) 2014   from UTI    Sinus headache    Sjogren's disease (HCC)    Urgency of urination    Past Surgical History:  Procedure Laterality Date   44 HOUR PH STUDY N/A 04/03/2016   Procedure: 24 HOUR PH STUDY;  Surgeon: Ruffin Frederick, MD;  Location: [facility];  Service: Gastroenterology;  Laterality: N/A;   BREAST BIOPSY Right 08/23/2012   ADH   BREAST BIOPSY Right 10/11/2012   Ductal Carcinoma   BREAST EXCISIONAL BIOPSY Right 09/04/2013   benign   BREAST LUMPECTOMY Right 10/11/2012   W/ SLN BX   CARDIAC CATHETERIZATION  09-13-2007  DR Riley Kill   WELL-PRESERVED LVF/  DIFFUSE SCATTERED CORONARY CALCIFACATION AND ATHEROSCLEROSIS WITHOUT OBSTRUCTION   CARDIAC CATHETERIZATION  08-04-2010  DR Clifton James  NON-OBSTRUCTIVE CAD/  pLAD 40%/  oLAD 30%/  mLAD 30%/  pRCA 30%/  EF 60%   CARDIOVASCULAR STRESS TEST  06-18-2012  DR McALHANY   LOW RISK NUCLEAR STUDY/  SMALL FIXED AREA OF MODERATELY DECREASED UPTAKE IN ANTEROSEPTAL WALL WHICH MAY BE ARTIFACTUAL/  NO ISCHEMIA/  EF 68%   COLONOSCOPY  09/29/2010   CYSTOSCOPY     CYSTOSCOPY WITH HYDRODISTENSION AND BIOPSY N/A 03/06/2014   Procedure: CYSTOSCOPY/HYDRODISTENSION/ INSTILATION OF MARCAINE AND PYRIDIUM;  Surgeon: Kathi Ludwig, MD;  Location: [facility];  Service: Urology;  Laterality: N/A;   CYSTOSCOPY/URETEROSCOPY/HOLMIUM LASER/STENT PLACEMENT Left 03/21/2023   Procedure: CYSTOSCOPY LEFT RETROGRADE PYELOGRAM, LEFT URETEROSCOPY, HOLMIUM LASER LITHOTRIPSYM, AND LEFT URETERAL STENT PLACEMENT;  Surgeon: Rene Paci, MD;  Location: [facility];  Service: Urology;  Laterality: Left;  60 MINUTES   ESOPHAGEAL MANOMETRY N/A 04/03/2016   Procedure: ESOPHAGEAL MANOMETRY (EM);  Surgeon: Ruffin Frederick, MD;  Location: [facility];  Service: Gastroenterology;  Laterality: N/A;   EXTRACORPOREAL SHOCK WAVE LITHOTRIPSY Left 02/06/2019   Procedure: EXTRACORPOREAL SHOCK WAVE LITHOTRIPSY (ESWL);  Surgeon: Ihor Gully, MD;  Location: [facility];  Service: Urology;  Laterality: Left;   NASAL SINUS SURGERY  1985   ORIF RIGHT ANKLE  FX  2006   POLYPECTOMY     REMOVAL VOCAL CORD CYST  01/2013   RIGHT BREAST BX  08/23/2012   RIGHT HAND SURGERY  X3  LAST ONE 2009   INCLUDES  ORIF RIGHT 5TH FINGER AND REVISION TWICE   SKIN CANCER EXCISION     face,arms   TONSILLECTOMY AND ADENOIDECTOMY  AGE 54   TOTAL ABDOMINAL HYSTERECTOMY W/ BILATERAL SALPINGOOPHORECTOMY  1982   W/  APPENDECTOMY   TRANSTHORACIC ECHOCARDIOGRAM  06/24/2012   GRADE I DIASTOLIC DYSFUNCTION/  EF 55-60%/  MILD MR   UPPER GASTROINTESTINAL ENDOSCOPY     Patient Active Problem List   Diagnosis Date Noted   Multiple drug allergies 05/09/2023   Recurrent infections 05/09/2023   Rash and other nonspecific skin eruption 05/09/2023   Local reaction to hymenoptera sting 05/09/2023   Allergic reaction 05/09/2023   Other adverse food reactions, not elsewhere classified, subsequent encounter 05/09/2023   Moderate persistent asthma without complication 05/09/2023   Other allergic rhinitis 05/09/2023   Weight loss 05/01/2023   Strep pharyngitis 04/24/2023   Common bile duct dilatation 02/13/2023   Oral herpes 02/05/2023   Tongue lesion 02/05/2023   Drug reaction 02/05/2023   Cellulitis of breast 02/05/2023   Elevated alkaline phosphatase level 01/25/2023   Facial trauma 01/22/2023   Vertigo 01/22/2023   Intertrigo 12/13/2022   Hyperkalemia 09/28/2022   Anemia 09/28/2022   Tremor 07/05/2022   RUQ pain 07/05/2022   Leukocytosis 07/05/2022   Chronic renal insufficiency 03/21/2022   Sjogren's disease (HCC) 12/12/2021   Vocal cord cyst 05/18/2021   High serum vitamin D 05/18/2021   Dysuria 03/09/2021   Vitamin B12 deficiency  03/08/2021   Preventative health care 03/08/2021   Hyperglycemia 03/08/2021   Low back pain 09/08/2020   OSA and COPD overlap syndrome (HCC) 06/10/2020   Recurrent falls 05/02/2020   Statin intolerance 02/29/2020   RLS (restless legs syndrome) 11/09/2019   Kidney stone 02/26/2019   Recurrent sinusitis 02/25/2019   Hx of colonic polyp 02/25/2019   DDD (degenerative disc disease), cervical 09/17/2018   Degenerative scoliosis 04/22/2018   Primary insomnia 06/25/2017   Chronic interstitial cystitis 02/01/2017   Chronic cough 01/09/2017   Right arm pain 07/20/2016  Deviated septum 05/12/2016   Nasal turbinate hypertrophy 05/12/2016   Dysphagia    Allergic rhinitis 01/25/2016   Other and combined forms of senile cataract 07/15/2015   Dyspnea    Left hip pain 04/30/2015   Sciatica 04/30/2015   Knee pain, right 04/30/2015   Bilateral carpal tunnel syndrome 03/04/2015   Hyperlipidemia 03/01/2015   Pulmonary nodules 02/12/2015   External hemorrhoids 02/05/2015   Chronic night sweats 01/08/2015   Atypical chest pain 01/01/2015   Memory loss 05/22/2014   Peripheral neuropathy 05/22/2014   Abdominal pain 10/26/2013   Headache 10/13/2013   Arthralgia 08/18/2013   ANA positive 07/29/2013   Depression 02/05/2013   Family history of malignant hyperthermia 02/05/2013   Malignant neoplasm of lower-outer quadrant of right breast of female, estrogen receptor positive (HCC) 01/03/2013   Splenic lesion 09/02/2012   Asthma, allergic 08/05/2012   Acute bronchitis with COPD (HCC) 06/03/2012   GERD (gastroesophageal reflux disease) 06/03/2012   Edema 04/08/2012   Stricture and stenosis of esophagus 10/19/2011   Functional constipation 07/21/2011   Nonspecific (abnormal) findings on radiological and other examination of biliary tract 07/21/2011   Osteoporosis 04/17/2011   Leg pain 02/24/2011   FIBROCYSTIC BREAST DISEASE, HX OF 10/18/2010   Essential hypertension 08/25/2010   CAD in native  artery 08/25/2010   TOBACCO ABUSE, HX OF 08/25/2010   GENERALIZED ANXIETY DISORDER 08/03/2010   Breast pain 01/11/2010   Fibromyalgia 01/11/2010    ONSET DATE: 04/24/23 REFERRING DIAG:  R42 (ICD-10-CM) - Dizzy spells  R53.1 (ICD-10-CM) - Weakness   THERAPY DIAG:  Dizziness and giddiness  Unsteadiness on feet  BPPV (benign paroxysmal positional vertigo), left  Muscle weakness (generalized)  Rationale for Evaluation and Treatment: Rehabilitation  SUBJECTIVE:                                                                                                                                                                                           SUBJECTIVE STATEMENT: The patient is doing HEP and has not had any episodes of dizziness this week. Her balance is improving per subjective reports. Patient continues with L lateral foot pain. Patient states she continues to have pain along R posterior hip/low back. She mentioned recent GI doctor visit-- gets constipation x 7-10 days and feels this contributes.  Pt accompanied by: self  PERTINENT HISTORY: chronic back pain, (scheduled for back ablation in June 2024), h/o vertigo PAIN:  Are you having pain? Yes: NPRS scale: 6-7/10 Pain location: low back and also L lateral foot Pain description: chronic Aggravating factors: PT will monitor Relieving factors: *has upcoming intervention scheduled PRECAUTIONS: Fall WEIGHT BEARING RESTRICTIONS: No  FALLS: Has patient fallen in last 6 months? Yes. Number of falls 1.  LIVING ENVIRONMENT: Lives with: lives with their spouse Lives in: House/apartment Stairs: One step with one rail PLOF: Independent with basic ADLs-- notes declining mobility.  PATIENT GOALS: "stop the spinning and be able to walk and not falter."  OBJECTIVE:  (Measures in this section from initial evaluation unless otherwise noted) COGNITION: Overall cognitive status:  Patient mentions memory changes since first fall where she  hit her head -- she is unable to recall date SENSATION:  Patient reports numbness and cold sensations in feet. LOWER EXTREMITY MMT:   MMT Right Eval Left Eval  Hip flexion 3+/5 4/5  Knee flexion 4/5 4/5  Knee extension 5/5 5/5  Ankle dorsiflexion 3+/5 3+/5  Ankle plantarflexion    Ankle inversion    Ankle eversion    (Blank rows = not tested) BED MOBILITY:  Sit to supine Complete Independence Supine to sit Complete Independence Rolling to Right Complete Independence Rolling to Left Complete Independence *notes "I'm in a fog" with returning to sitting GAIT: Gait pattern:  slowed pace with SPC and wide BOS Distance walked: 100 ft Assistive device utilized: Single point cane FUNCTIONAL TESTS:  Berg= 40/56 5 time sit to stand=20.46 seconds with Ues on legs 57M=0.64 m/sec or 2.10 ft/sec PATIENT SURVEYS:  FOTO n/a--not measured  due to vertigo/balance  VESTIBULAR ASSESSMENT:  GENERAL OBSERVATION: Gets intermittent dizziness SYMPTOM BEHAVIOR:  Subjective history: Has intermittent h/o vertigo  Non-Vestibular symptoms:  none  Type of dizziness: Spinning/Vertigo, Unsteady with head/body turns, and Lightheadedness/Faint  Frequency: daily   Duration: seconds to minutes  Aggravating factors: Worse in the morning and with getting up after sitting for longer periods  Relieving factors: unsure  Progression of symptoms: unchanged OCULOMOTOR EXAM:  Ocular Alignment: normal  Ocular ROM: No Limitations  Spontaneous Nystagmus: absent  Gaze-Induced Nystagmus: absent  Smooth Pursuits: intact but notes target seems "out of reach"   Saccades: intact VESTIBULAR - OCULAR REFLEX:   Slow VOR: Comment: 2 reps provokes a sensation of bad spinning  Head-Impulse Test:  Did not test today due to inability to tolerate slow VOR  Dynamic Visual Acuity: to be assessed POSITIONAL TESTING: Right Dix-Hallpike: no nystagmus Left Dix-Hallpike: no nystagmus Right Roll Test: TBA Left Roll Test:  TBA Right Sidelying: no nystagmus Left Sidelying: no nystagmus *Notes symptoms with return to sitting.   [facility] Adult PT Treatment:                                                DATE: 05/23/23 Therapeutic Exercise: Supine Bent knee fallouts x 10 reps R and L sides Marching alternating x 10 reps Hip adductor isometrics with ball squeeze Pelvic tilts x 5 reps SLR x 5 reps R and L  Gentle lumbar rocking with arms in T position x 10 reps Sitting Sit to stand x 10 reps dec'ing UE support "stomp and stand" to encourage powering up Yellow t-band scap retraction x 10 reps x 2 sets Rib flare with tactile cues for deep breathing (patient noting she is having coughing spells and feels tight) Neuromuscular re-ed: Sitting Gaze x 1 adaptation x 10 reps with increased dizziness in horizontal plane Gaze x 1 adaptation x 10 reps with increased dizziness in vertical plane (worse vertical for symptom rating) Habituation with horizontal head turns x 5 reps and  rest Standing Marching alternating with cues for core engagement x 10 reps Single leg standing Wall bumps-- increased pain, therefore held activity Heel raises for balance Alternating foot taps to 4" step Up/up, down/down x 5 reps R and L sides leading Gait: Without Device x 30 feet x 3 reps  [facility] Adult PT Treatment:                                                DATE: 05/18/2023 Therapeutic Exercise: STS (knee valgus) Seated unilateral clamshell 2x10 RTB Neuromuscular re-ed: Habituation: seated yaw & pitch HT 5x30" each Walking + yaw HT every 3 steps (dizzy with turning directions towards R) Standing 180 degree turns L/R (dizzy w/turns to R) Habituation: sit <--> supine rolling to R (discontinued d/t hip pain)  [facility] Adult PT Treatment:                                                DATE: 05/16/23 Therapeutic Exercise: Supine Towel roll under sacral region for gentle stretch Marching x 12 reps Gentle rocking lateral rotation Bent knee  fallouts with pain L hip Decompression exercises with ball push x 5 second holds x 5 reps Sitting Sit<>stand x 5 reps with cues to power up x 3 sets Neuromuscular re-ed: Notes some spinning with return to sitting after therapeutic ex Habituation: with seated horiz head turns x 3 reps before getting shooting HA Habituation vertical head turns Gaze x 1 attempted with difficulty-- switched to seated slow habituation with eyes and head moving Standing wall bumps x 5 reps Heel raises Reaching in stride position for target on wall Self Care: Patient uses "[employer]" wellness platform and is doing chair yoga  PATIENT EDUCATION: Education details: HEP Person educated: Patient Education method: Explanation Education comprehension: verbalized understanding  HOME EXERCISE PROGRAM: Access Code: [email or web address] URL: [email or web address] Date: 05/18/2023 Prepared by: Carlynn Herald  Exercises - Sit to Stand with Armchair  - 2 x daily - 7 x weekly - 1 sets - 5 reps - Seated habituation head turns  - 2 x daily - 7 x weekly - 1 sets - 3 reps - Seated Single Leg Hip Abduction with Resistance  - 1 x daily - 7 x weekly - 3 sets - 10 reps  GOALS: Goals reviewed with patient? Yes  SHORT TERM GOALS: Target date: 06/06/23  Patient will be independent with initial HEP.  Baseline: To be given Goal status: INITIAL  2.  Patient will complete Berg, and FGA if indicated. Baseline: 40/56 (Berg) Goal status: MET   3.  Patient will report decreased dizziness by 50%. Baseline: n/a Goal status: INITIAL  LONG TERM GOALS:  Target date: 07/05/23  Patient will be independent with advanced/ongoing HEP to improve outcomes and carryover.  Baseline: to be established. Goal status: INITIAL  2.  Patient will improve Berg balance score by 5 points to demo improved functional balance.  Baseline: 40/56  Goal status: INITIAL  3. Patient will demo floor<>stand with UE support mod indep for improved  functional strength.  Baseline: has h/o falls.  Goal status: INITIAL  4. Patient will improve gait speed to 2.8 ft/sec to demo improving mobility and dec'd risk for falls.  Baseline: 2.10 ft/sec  Goal status: INITIAL  ASSESSMENT:  CLINICAL IMPRESSION: Patient has increased tolerance of gaze x 1 viewing today and speed of habituation.  She is also able to increase sit ot stand to 10 reps dec'ing UE input. Patient tolerating increased activities today.  OBJECTIVE IMPAIRMENTS: Abnormal gait, decreased activity tolerance, decreased balance, decreased strength, dizziness, impaired sensation, and pain.   PLAN:  PT FREQUENCY: 2x/week  PT DURATION: 8 weeks  PLANNED INTERVENTIONS: Therapeutic exercises, Therapeutic activity, Neuromuscular re-education, Balance training, Gait training, Patient/Family education, Self Care, Joint mobilization, Vestibular training, Canalith repositioning, Aquatic Therapy, Dry Needling, Electrical stimulation, Moist heat, Taping, and Manual therapy  PLAN FOR NEXT SESSION: Progress gait activities to tolerance and habituation as indicated.   Christos Mixson, PT 05/23/2023 11:04 AM

## 2023-05-25 ENCOUNTER — Ambulatory Visit: Payer: Medicare Other | Admitting: Rehabilitative and Restorative Service Providers"

## 2023-05-25 ENCOUNTER — Encounter: Payer: Self-pay | Admitting: Rehabilitative and Restorative Service Providers"

## 2023-05-25 DIAGNOSIS — H8112 Benign paroxysmal vertigo, left ear: Secondary | ICD-10-CM | POA: Diagnosis not present

## 2023-05-25 DIAGNOSIS — T63481A Toxic effect of venom of other arthropod, accidental (unintentional), initial encounter: Secondary | ICD-10-CM | POA: Diagnosis not present

## 2023-05-25 DIAGNOSIS — M5459 Other low back pain: Secondary | ICD-10-CM | POA: Diagnosis not present

## 2023-05-25 DIAGNOSIS — M6281 Muscle weakness (generalized): Secondary | ICD-10-CM

## 2023-05-25 DIAGNOSIS — R2681 Unsteadiness on feet: Secondary | ICD-10-CM | POA: Diagnosis not present

## 2023-05-25 DIAGNOSIS — R278 Other lack of coordination: Secondary | ICD-10-CM | POA: Diagnosis not present

## 2023-05-25 DIAGNOSIS — G8929 Other chronic pain: Secondary | ICD-10-CM | POA: Diagnosis not present

## 2023-05-25 DIAGNOSIS — L309 Dermatitis, unspecified: Secondary | ICD-10-CM | POA: Diagnosis not present

## 2023-05-25 DIAGNOSIS — J3089 Other allergic rhinitis: Secondary | ICD-10-CM | POA: Diagnosis not present

## 2023-05-25 DIAGNOSIS — R42 Dizziness and giddiness: Secondary | ICD-10-CM

## 2023-05-25 DIAGNOSIS — R531 Weakness: Secondary | ICD-10-CM | POA: Diagnosis not present

## 2023-05-25 DIAGNOSIS — T7840XD Allergy, unspecified, subsequent encounter: Secondary | ICD-10-CM | POA: Diagnosis not present

## 2023-05-25 DIAGNOSIS — R1084 Generalized abdominal pain: Secondary | ICD-10-CM | POA: Diagnosis not present

## 2023-05-25 DIAGNOSIS — R21 Rash and other nonspecific skin eruption: Secondary | ICD-10-CM | POA: Diagnosis not present

## 2023-05-25 DIAGNOSIS — M545 Low back pain, unspecified: Secondary | ICD-10-CM | POA: Diagnosis not present

## 2023-05-25 DIAGNOSIS — R252 Cramp and spasm: Secondary | ICD-10-CM | POA: Diagnosis not present

## 2023-05-25 DIAGNOSIS — M542 Cervicalgia: Secondary | ICD-10-CM | POA: Diagnosis not present

## 2023-05-25 NOTE — Therapy (Signed)
OUTPATIENT PHYSICAL THERAPY NEURO TREATMENT   Patient Name: [name] MRN: [id number] DOB:[birth year], 77 y.o., female Today's Date: 05/25/2023  PCP: Danise Edge, MD REFERRING PROVIDER: Danise Edge, MD  END OF SESSION:  PT End of Session - 05/25/23 1019     Visit Number 6    Number of Visits 16    Date for PT Re-Evaluation 07/05/23    Authorization Type [employer] medicare    Progress Note Due on Visit 10    PT Start Time 1019    PT Stop Time 1102    PT Time Calculation (min) 43 min    Equipment Utilized During Treatment Gait belt    Activity Tolerance Patient tolerated treatment well    Behavior During Therapy WFL for tasks assessed/performed            Past Medical History:  Diagnosis Date   Allergy    Anemia    Anxiety    past hx    Arthritis    Asthma    Atrial flutter (HCC)    past history- not current   Breast cancer (HCC)    CAD (coronary artery disease) CARDIOLOGIST--  DR Clifton James   mild non-obstructive cad   Cancer (HCC)    right   Cataract    bilaterally removed    Chronic constipation    Chronic kidney disease    stage 3 ckd, interstitial cystitis   Concussion    x 3   COPD (chronic obstructive pulmonary disease) (HCC)    Depression    past hx    Dyspnea    Dysrhythmia    Family history of adverse reaction to anesthesia    Family history of malignant hyperthermia    father had this   Fibromyalgia    Frequency of urination    GERD (gastroesophageal reflux disease)    H/O hiatal hernia    History of basal cell carcinoma excision    X2   History of breast cancer ONCOLOGIST-- DR Darnelle Catalan---  NO RECURRANCE   DX 07/2012;  LOW GRADE DCIS  ER+PR+  ----  S/P RIGHT LUMPECTOMY WITH NEGATIVE MARGINS/   RADIATION ENDED 11/2012   History of chronic bronchitis    History of colonic polyps    History of tachycardia    CONTROLLED  WITH ATENOLOL   Hyperlipidemia    Hypertension    Neuromuscular disorder (HCC)    fibromyalgia   Neuropathy     Osteoporosis 01/2019   T score -2.2 stable/improved from prior study   Pelvic pain    Personal history of radiation therapy    Pneumonia    S/P radiation therapy 11/12/12 - 12/05/12   right Breast   Sepsis (HCC) 2014   from UTI    Sinus headache    Sjogren's disease (HCC)    Urgency of urination    Past Surgical History:  Procedure Laterality Date   17 HOUR PH STUDY N/A 04/03/2016   Procedure: 24 HOUR PH STUDY;  Surgeon: Ruffin Frederick, MD;  Location: [facility];  Service: Gastroenterology;  Laterality: N/A;   BREAST BIOPSY Right 08/23/2012   ADH   BREAST BIOPSY Right 10/11/2012   Ductal Carcinoma   BREAST EXCISIONAL BIOPSY Right 09/04/2013   benign   BREAST LUMPECTOMY Right 10/11/2012   W/ SLN BX   CARDIAC CATHETERIZATION  09-13-2007  DR Riley Kill   WELL-PRESERVED LVF/  DIFFUSE SCATTERED CORONARY CALCIFACATION AND ATHEROSCLEROSIS WITHOUT OBSTRUCTION   CARDIAC CATHETERIZATION  08-04-2010  DR Clifton James  NON-OBSTRUCTIVE CAD/  pLAD 40%/  oLAD 30%/  mLAD 30%/  pRCA 30%/  EF 60%   CARDIOVASCULAR STRESS TEST  06-18-2012  DR McALHANY   LOW RISK NUCLEAR STUDY/  SMALL FIXED AREA OF MODERATELY DECREASED UPTAKE IN ANTEROSEPTAL WALL WHICH MAY BE ARTIFACTUAL/  NO ISCHEMIA/  EF 68%   COLONOSCOPY  09/29/2010   CYSTOSCOPY     CYSTOSCOPY WITH HYDRODISTENSION AND BIOPSY N/A 03/06/2014   Procedure: CYSTOSCOPY/HYDRODISTENSION/ INSTILATION OF MARCAINE AND PYRIDIUM;  Surgeon: Kathi Ludwig, MD;  Location: [facility];  Service: Urology;  Laterality: N/A;   CYSTOSCOPY/URETEROSCOPY/HOLMIUM LASER/STENT PLACEMENT Left 03/21/2023   Procedure: CYSTOSCOPY LEFT RETROGRADE PYELOGRAM, LEFT URETEROSCOPY, HOLMIUM LASER LITHOTRIPSYM, AND LEFT URETERAL STENT PLACEMENT;  Surgeon: Rene Paci, MD;  Location: [facility];  Service: Urology;  Laterality: Left;  60 MINUTES   ESOPHAGEAL MANOMETRY N/A 04/03/2016   Procedure: ESOPHAGEAL MANOMETRY (EM);  Surgeon: Ruffin Frederick, MD;  Location: [facility];  Service: Gastroenterology;  Laterality: N/A;   EXTRACORPOREAL SHOCK WAVE LITHOTRIPSY Left 02/06/2019   Procedure: EXTRACORPOREAL SHOCK WAVE LITHOTRIPSY (ESWL);  Surgeon: Ihor Gully, MD;  Location: [facility];  Service: Urology;  Laterality: Left;   NASAL SINUS SURGERY  1985   ORIF RIGHT ANKLE  FX  2006   POLYPECTOMY     REMOVAL VOCAL CORD CYST  01/2013   RIGHT BREAST BX  08/23/2012   RIGHT HAND SURGERY  X3  LAST ONE 2009   INCLUDES  ORIF RIGHT 5TH FINGER AND REVISION TWICE   SKIN CANCER EXCISION     face,arms   TONSILLECTOMY AND ADENOIDECTOMY  AGE 37   TOTAL ABDOMINAL HYSTERECTOMY W/ BILATERAL SALPINGOOPHORECTOMY  1982   W/  APPENDECTOMY   TRANSTHORACIC ECHOCARDIOGRAM  06/24/2012   GRADE I DIASTOLIC DYSFUNCTION/  EF 55-60%/  MILD MR   UPPER GASTROINTESTINAL ENDOSCOPY     Patient Active Problem List   Diagnosis Date Noted   Multiple drug allergies 05/09/2023   Recurrent infections 05/09/2023   Rash and other nonspecific skin eruption 05/09/2023   Local reaction to hymenoptera sting 05/09/2023   Allergic reaction 05/09/2023   Other adverse food reactions, not elsewhere classified, subsequent encounter 05/09/2023   Moderate persistent asthma without complication 05/09/2023   Other allergic rhinitis 05/09/2023   Weight loss 05/01/2023   Strep pharyngitis 04/24/2023   Common bile duct dilatation 02/13/2023   Oral herpes 02/05/2023   Tongue lesion 02/05/2023   Drug reaction 02/05/2023   Cellulitis of breast 02/05/2023   Elevated alkaline phosphatase level 01/25/2023   Facial trauma 01/22/2023   Vertigo 01/22/2023   Intertrigo 12/13/2022   Hyperkalemia 09/28/2022   Anemia 09/28/2022   Tremor 07/05/2022   RUQ pain 07/05/2022   Leukocytosis 07/05/2022   Chronic renal insufficiency 03/21/2022   Sjogren's disease (HCC) 12/12/2021   Vocal cord cyst 05/18/2021   High serum vitamin D 05/18/2021   Dysuria 03/09/2021   Vitamin B12 deficiency  03/08/2021   Preventative health care 03/08/2021   Hyperglycemia 03/08/2021   Low back pain 09/08/2020   OSA and COPD overlap syndrome (HCC) 06/10/2020   Recurrent falls 05/02/2020   Statin intolerance 02/29/2020   RLS (restless legs syndrome) 11/09/2019   Kidney stone 02/26/2019   Recurrent sinusitis 02/25/2019   Hx of colonic polyp 02/25/2019   DDD (degenerative disc disease), cervical 09/17/2018   Degenerative scoliosis 04/22/2018   Primary insomnia 06/25/2017   Chronic interstitial cystitis 02/01/2017   Chronic cough 01/09/2017   Right arm pain 07/20/2016  Deviated septum 05/12/2016   Nasal turbinate hypertrophy 05/12/2016   Dysphagia    Allergic rhinitis 01/25/2016   Other and combined forms of senile cataract 07/15/2015   Dyspnea    Left hip pain 04/30/2015   Sciatica 04/30/2015   Knee pain, right 04/30/2015   Bilateral carpal tunnel syndrome 03/04/2015   Hyperlipidemia 03/01/2015   Pulmonary nodules 02/12/2015   External hemorrhoids 02/05/2015   Chronic night sweats 01/08/2015   Atypical chest pain 01/01/2015   Memory loss 05/22/2014   Peripheral neuropathy 05/22/2014   Abdominal pain 10/26/2013   Headache 10/13/2013   Arthralgia 08/18/2013   ANA positive 07/29/2013   Depression 02/05/2013   Family history of malignant hyperthermia 02/05/2013   Malignant neoplasm of lower-outer quadrant of right breast of female, estrogen receptor positive (HCC) 01/03/2013   Splenic lesion 09/02/2012   Asthma, allergic 08/05/2012   Acute bronchitis with COPD (HCC) 06/03/2012   GERD (gastroesophageal reflux disease) 06/03/2012   Edema 04/08/2012   Stricture and stenosis of esophagus 10/19/2011   Functional constipation 07/21/2011   Nonspecific (abnormal) findings on radiological and other examination of biliary tract 07/21/2011   Osteoporosis 04/17/2011   Leg pain 02/24/2011   FIBROCYSTIC BREAST DISEASE, HX OF 10/18/2010   Essential hypertension 08/25/2010   CAD in native  artery 08/25/2010   TOBACCO ABUSE, HX OF 08/25/2010   GENERALIZED ANXIETY DISORDER 08/03/2010   Breast pain 01/11/2010   Fibromyalgia 01/11/2010    ONSET DATE: 04/24/23 REFERRING DIAG:  R42 (ICD-10-CM) - Dizzy spells  R53.1 (ICD-10-CM) - Weakness   THERAPY DIAG:  Dizziness and giddiness  Unsteadiness on feet  Muscle weakness (generalized)  Rationale for Evaluation and Treatment: Rehabilitation  SUBJECTIVE:                                                                                                                                                                                           SUBJECTIVE STATEMENT: The patient is feeling stronger and balance is better, Her back is continuing to limit her. She is doing HEP regularly.  No episodes of dizziness.  Pt accompanied by: self PERTINENT HISTORY: chronic back pain, (scheduled for back ablation in June 2024), h/o vertigo PAIN:  Are you having pain? Yes: NPRS scale: 6-7/10 Pain location: low back and also L lateral foot Pain description: chronic Aggravating factors: PT will monitor Relieving factors: *has upcoming intervention scheduled PRECAUTIONS: Fall WEIGHT BEARING RESTRICTIONS: No FALLS: Has patient fallen in last 6 months? Yes. Number of falls 1.  LIVING ENVIRONMENT: Lives with: lives with their spouse Lives in: House/apartment Stairs: One step with one rail PLOF: Independent with basic ADLs-- notes declining  mobility.  PATIENT GOALS: "stop the spinning and be able to walk and not falter."  OBJECTIVE:  (Measures in this section from initial evaluation unless otherwise noted) COGNITION: Overall cognitive status:  Patient mentions memory changes since first fall where she hit her head -- she is unable to recall date SENSATION:  Patient reports numbness and cold sensations in feet. LOWER EXTREMITY MMT:   MMT Right Eval Left Eval  Hip flexion 3+/5 4/5  Knee flexion 4/5 4/5  Knee extension 5/5 5/5  Ankle  dorsiflexion 3+/5 3+/5  Ankle plantarflexion    Ankle inversion    Ankle eversion    (Blank rows = not tested) BED MOBILITY:  Sit to supine Complete Independence Supine to sit Complete Independence Rolling to Right Complete Independence Rolling to Left Complete Independence *notes "I'm in a fog" with returning to sitting GAIT: Gait pattern:  slowed pace with SPC and wide BOS Distance walked: 100 ft Assistive device utilized: Single point cane FUNCTIONAL TESTS:  Berg= 40/56 5 time sit to stand=20.46 seconds with Ues on legs 45M=0.64 m/sec or 2.10 ft/sec PATIENT SURVEYS:  FOTO n/a--not measured  due to vertigo/balance  VESTIBULAR ASSESSMENT: GENERAL OBSERVATION: Gets intermittent dizziness SYMPTOM BEHAVIOR:  Subjective history: Has intermittent h/o vertigo  Non-Vestibular symptoms:  none  Type of dizziness: Spinning/Vertigo, Unsteady with head/body turns, and Lightheadedness/Faint  Frequency: daily   Duration: seconds to minutes  Aggravating factors: Worse in the morning and with getting up after sitting for longer periods  Relieving factors: unsure  Progression of symptoms: unchanged OCULOMOTOR EXAM:  Ocular Alignment: normal  Ocular ROM: No Limitations  Spontaneous Nystagmus: absent  Gaze-Induced Nystagmus: absent  Smooth Pursuits: intact but notes target seems "out of reach"   Saccades: intact VESTIBULAR - OCULAR REFLEX:   Slow VOR: Comment: 2 reps provokes a sensation of bad spinning  Head-Impulse Test:  Did not test today due to inability to tolerate slow VOR  Dynamic Visual Acuity: to be assessed POSITIONAL TESTING: Right Dix-Hallpike: no nystagmus Left Dix-Hallpike: no nystagmus Right Roll Test: TBA Left Roll Test: TBA Right Sidelying: no nystagmus Left Sidelying: no nystagmus *Notes symptoms with return to sitting.   [facility] Adult PT Treatment:                                                DATE: 05/25/23  Therapeutic Exercise: Standing Wall squats with ball  between knees x 10 reps Wall squats mini without ball x 10 reps Palloff press with green band with CGA x 10 reps R and L sides Seated Red band with pelvic rocking anterior/posterior tilting Sit<>stand x 5 reps without UE support Supine Legs supported on physioball Lumbar rotation Manual Therapy: Gentle PA mobs lumbar spine and SI Neuromuscular re-ed: Seated Habituation Head rotation x 5 reps Gaze x 1 viewing x 10 reps with small head motion horizontal plane Gaze x 1 viewing x 10 reps vertical plane Standing Marching in place with cues to avoid lumbar posterior tilt x 10 reps without UE support with CGA Sidestepping x 10 feet x 4 reps with cues on core engagement Gait: Ambulation without SPC in clinic x 80 feet x 3 reps mod indep   [facility] Adult PT Treatment:  DATE: 05/23/23 Therapeutic Exercise: Supine Bent knee fallouts x 10 reps R and L sides Marching alternating x 10 reps Hip adductor isometrics with ball squeeze Pelvic tilts x 5 reps SLR x 5 reps R and L  Gentle lumbar rocking with arms in T position x 10 reps Sitting Sit to stand x 10 reps dec'ing UE support "stomp and stand" to encourage powering up Yellow t-band scap retraction x 10 reps x 2 sets Rib flare with tactile cues for deep breathing (patient noting she is having coughing spells and feels tight) Neuromuscular re-ed: Sitting Gaze x 1 adaptation x 10 reps with increased dizziness in horizontal plane Gaze x 1 adaptation x 10 reps with increased dizziness in vertical plane (worse vertical for symptom rating) Habituation with horizontal head turns x 5 reps and rest Standing Marching alternating with cues for core engagement x 10 reps Single leg standing Wall bumps-- increased pain, therefore held activity Heel raises for balance Alternating foot taps to 4" step Up/up, down/down x 5 reps R and L sides leading Gait: Without Device x 30 feet x 3 reps  [facility] Adult PT  Treatment:                                                DATE: 05/18/2023 Therapeutic Exercise: STS (knee valgus) Seated unilateral clamshell 2x10 RTB Neuromuscular re-ed: Habituation: seated yaw & pitch HT 5x30" each Walking + yaw HT every 3 steps (dizzy with turning directions towards R) Standing 180 degree turns L/R (dizzy w/turns to R) Habituation: sit <--> supine rolling to R (discontinued d/t hip pain)  PATIENT EDUCATION: Education details: HEP Person educated: Patient Education method: Explanation Education comprehension: verbalized understanding  HOME EXERCISE PROGRAM: Access Code: [email or web address] URL: [email or web address] Date: 05/25/2023 Prepared by: Margretta Ditty  Exercises - Sit to Stand with Armchair  - 2 x daily - 7 x weekly - 1 sets - 5 reps - Seated habituation head turns  - 2 x daily - 7 x weekly - 1 sets - 3 reps - Seated Gaze Stabilization with Head Rotation  - 2 x daily - 7 x weekly - 1 sets - 10 reps - Seated Single Leg Hip Abduction with Resistance  - 1 x daily - 7 x weekly - 3 sets - 10 reps - Supine Lower Trunk Rotation  - 2 x daily - 7 x weekly - 1 sets - 10 reps  GOALS: Goals reviewed with patient? Yes  SHORT TERM GOALS: Target date: 06/06/23  Patient will be independent with initial HEP.  Baseline: To be given Goal status: MET  2.  Patient will complete Berg, and FGA if indicated. Baseline: 40/56 (Berg) Goal status: MET   3.  Patient will report decreased dizziness by 50%. Baseline: n/a Goal status: MET  LONG TERM GOALS:  Target date: 07/05/23  Patient will be independent with advanced/ongoing HEP to improve outcomes and carryover.  Baseline: to be established. Goal status: INITIAL  2.  Patient will improve Berg balance score by 5 points to demo improved functional balance.  Baseline: 40/56  Goal status: INITIAL  3. Patient will demo floor<>stand with UE support mod indep for improved functional strength.  Baseline: has h/o  falls.  Goal status: INITIAL  4. Patient will improve gait speed to 2.8 ft/sec to demo improving mobility and dec'd risk for falls.  Baseline: 2.10 ft/sec  Goal status: INITIAL  ASSESSMENT:  CLINICAL IMPRESSION: Patient has met STGs for physical therapy. She is tolerating increased activity this week in therapy and notes a significant improvement in dizziness and balance. PT to continue to progress to LTGs within patient's tolerance and monitoring chronic low back pain.   OBJECTIVE IMPAIRMENTS: Abnormal gait, decreased activity tolerance, decreased balance, decreased strength, dizziness, impaired sensation, and pain.   PLAN:  PT FREQUENCY: 2x/week  PT DURATION: 8 weeks  PLANNED INTERVENTIONS: Therapeutic exercises, Therapeutic activity, Neuromuscular re-education, Balance training, Gait training, Patient/Family education, Self Care, Joint mobilization, Vestibular training, Canalith repositioning, Aquatic Therapy, Dry Needling, Electrical stimulation, Moist heat, Taping, and Manual therapy  PLAN FOR NEXT SESSION: Progress gait activities to tolerance, LE strength, balance.  Yeray Tomas, PT 05/25/2023 11:47 AM

## 2023-05-27 NOTE — Assessment & Plan Note (Signed)
Well controlled, no changes to meds. Encouraged heart healthy diet such as the DASH diet and exercise as tolerated.  °

## 2023-05-27 NOTE — Assessment & Plan Note (Signed)
Hydrate and monitor 

## 2023-05-27 NOTE — Assessment & Plan Note (Signed)
Does not tolerate statins.

## 2023-05-27 NOTE — Assessment & Plan Note (Signed)
hgba1c acceptable, minimize simple carbs. Increase exercise as tolerated. Continue current meds 

## 2023-05-28 ENCOUNTER — Ambulatory Visit (INDEPENDENT_AMBULATORY_CARE_PROVIDER_SITE_OTHER): Payer: Medicare Other | Admitting: Family Medicine

## 2023-05-28 VITALS — BP 110/68 | HR 73 | Temp 97.8°F | Resp 16 | Ht 64.0 in | Wt 126.8 lb

## 2023-05-28 DIAGNOSIS — D75839 Thrombocytosis, unspecified: Secondary | ICD-10-CM | POA: Diagnosis not present

## 2023-05-28 DIAGNOSIS — R059 Cough, unspecified: Secondary | ICD-10-CM

## 2023-05-28 DIAGNOSIS — Z122 Encounter for screening for malignant neoplasm of respiratory organs: Secondary | ICD-10-CM

## 2023-05-28 DIAGNOSIS — K219 Gastro-esophageal reflux disease without esophagitis: Secondary | ICD-10-CM

## 2023-05-28 DIAGNOSIS — E785 Hyperlipidemia, unspecified: Secondary | ICD-10-CM

## 2023-05-28 DIAGNOSIS — R0600 Dyspnea, unspecified: Secondary | ICD-10-CM

## 2023-05-28 DIAGNOSIS — R739 Hyperglycemia, unspecified: Secondary | ICD-10-CM

## 2023-05-28 DIAGNOSIS — E559 Vitamin D deficiency, unspecified: Secondary | ICD-10-CM | POA: Diagnosis not present

## 2023-05-28 DIAGNOSIS — N189 Chronic kidney disease, unspecified: Secondary | ICD-10-CM

## 2023-05-28 DIAGNOSIS — J209 Acute bronchitis, unspecified: Secondary | ICD-10-CM

## 2023-05-28 DIAGNOSIS — K5900 Constipation, unspecified: Secondary | ICD-10-CM

## 2023-05-28 DIAGNOSIS — J440 Chronic obstructive pulmonary disease with acute lower respiratory infection: Secondary | ICD-10-CM

## 2023-05-28 DIAGNOSIS — I10 Essential (primary) hypertension: Secondary | ICD-10-CM | POA: Diagnosis not present

## 2023-05-28 LAB — COMPREHENSIVE METABOLIC PANEL
ALT: 7 U/L (ref 0–35)
AST: 18 U/L (ref 0–37)
Albumin: 3.7 g/dL (ref 3.5–5.2)
Alkaline Phosphatase: 95 U/L (ref 39–117)
BUN: 13 mg/dL (ref 6–23)
CO2: 30 mEq/L (ref 19–32)
Calcium: 9 mg/dL (ref 8.4–10.5)
Chloride: 104 mEq/L (ref 96–112)
Creatinine, Ser: 0.96 mg/dL (ref 0.40–1.20)
GFR: 56.91 mL/min — ABNORMAL LOW (ref >60.00)
Glucose, Bld: 89 mg/dL (ref 70–99)
Potassium: 5.3 mEq/L — ABNORMAL HIGH (ref 3.5–5.1)
Sodium: 142 mEq/L (ref 135–145)
Total Bilirubin: 0.4 mg/dL (ref 0.2–1.2)
Total Protein: 6.3 g/dL (ref 6.0–8.3)

## 2023-05-28 LAB — CBC WITH DIFFERENTIAL/PLATELET
Basophils Absolute: 0.1 10*3/uL (ref 0.0–0.1)
Basophils Relative: 1 % (ref 0.0–3.0)
Eosinophils Absolute: 0.6 10*3/uL (ref 0.0–0.7)
Eosinophils Relative: 7.5 % — ABNORMAL HIGH (ref 0.0–5.0)
HCT: 36.2 % (ref 36.0–46.0)
Hemoglobin: 11.3 g/dL — ABNORMAL LOW (ref 12.0–15.0)
Lymphocytes Relative: 19.6 % (ref 12.0–46.0)
Lymphs Abs: 1.6 10*3/uL (ref 0.7–4.0)
MCHC: 31.1 g/dL (ref 30.0–36.0)
MCV: 88.4 fl (ref 78.0–100.0)
Monocytes Absolute: 0.6 10*3/uL (ref 0.1–1.0)
Monocytes Relative: 7.7 % (ref 3.0–12.0)
Neutro Abs: 5.3 10*3/uL (ref 1.4–7.7)
Neutrophils Relative %: 64.2 % (ref 43.0–77.0)
Platelets: 301 10*3/uL (ref 150.0–400.0)
RBC: 4.1 Mil/uL (ref 3.87–5.11)
RDW: 18.4 % — ABNORMAL HIGH (ref 11.5–15.5)
WBC: 8.2 10*3/uL (ref 4.0–10.5)

## 2023-05-28 LAB — HEMOGLOBIN A1C: Hgb A1c MFr Bld: 5.5 % (ref 4.6–6.5)

## 2023-05-28 LAB — VITAMIN D 25 HYDROXY (VIT D DEFICIENCY, FRACTURES): VITD: 40.59 ng/mL (ref 30.00–100.00)

## 2023-05-28 MED ORDER — PANTOPRAZOLE SODIUM 40 MG PO TBEC: 40.0000 mg | DELAYED_RELEASE_TABLET | Freq: Two times a day (BID) | ORAL | 1 refills | Status: DC

## 2023-05-28 NOTE — Patient Instructions (Signed)

## 2023-05-28 NOTE — Assessment & Plan Note (Signed)
Miralax 1/2 cap bid for a couple of days. Is following with Dr Adela Lank.

## 2023-05-29 ENCOUNTER — Other Ambulatory Visit: Payer: Self-pay

## 2023-05-29 ENCOUNTER — Other Ambulatory Visit: Payer: Self-pay | Admitting: Family Medicine

## 2023-05-29 DIAGNOSIS — D75839 Thrombocytosis, unspecified: Secondary | ICD-10-CM

## 2023-05-29 DIAGNOSIS — N189 Chronic kidney disease, unspecified: Secondary | ICD-10-CM

## 2023-05-29 NOTE — Progress Notes (Signed)
Subjective:    Patient ID: [name], female    DOB: [birth year], 77 y.o.   MRN: [id number]  Chief Complaint  Patient presents with   Follow-up    Follow up   Discussed the use of AI scribe software for clinical note transcription with the patient, who gave verbal consent to proceed.  History of Present Illness   The patient, with a history of smoking, presents with a persistent cough and shortness of breath. They report that the cough has not fully cleared up since a previous bout of strep throat and respiratory illness dating back roughly 6 months. The patient also mentions experiencing dizziness really feeling a bit woozey not true spinning, particularly when turning their head or corners, and has been attending physical therapy twice a week to manage this.  In addition to these symptoms, the patient reports worsening memory issues, often needing their sentences finished for them. They attribute this decline to a fall they had about six to eight months ago where they hit their head. They also report a decrease in energy levels and increased fatigue, often preferring to stay on the couch but forcing themselves to stay active.  The patient has also been experiencing weight loss, losing about 13-14 pounds in the last six to eight months. They have not made any significant changes to their diet or activity levels, but have started taking multiple vitamins and supplements around the same time the weight loss started.  Gastrointestinal issues are also a concern for the patient. They report that their gastroesophageal reflux disease (GERD) is getting worse and they are having major problems with breathing. They also report constipation, having not been able to pass stool for twelve days at one point, causing pain and bleeding from hemorrhoids.       Past Medical History:  Diagnosis Date   Allergy    Anemia    Anxiety    past hx    Arthritis    Asthma    Atrial flutter (HCC)    past  history- not current   Breast cancer (HCC)    CAD (coronary artery disease) CARDIOLOGIST--  DR Clifton James   mild non-obstructive cad   Cancer (HCC)    right   Cataract    bilaterally removed    Chronic constipation    Chronic kidney disease    stage 3 ckd, interstitial cystitis   Concussion    x 3   COPD (chronic obstructive pulmonary disease) (HCC)    Depression    past hx    Dyspnea    Dysrhythmia    Family history of adverse reaction to anesthesia    Family history of malignant hyperthermia    father had this   Fibromyalgia    Frequency of urination    GERD (gastroesophageal reflux disease)    H/O hiatal hernia    History of basal cell carcinoma excision    X2   History of breast cancer ONCOLOGIST-- DR Darnelle Catalan---  NO RECURRANCE   DX 07/2012;  LOW GRADE DCIS  ER+PR+  ----  S/P RIGHT LUMPECTOMY WITH NEGATIVE MARGINS/   RADIATION ENDED 11/2012   History of chronic bronchitis    History of colonic polyps    History of tachycardia    CONTROLLED  WITH ATENOLOL   Hyperlipidemia    Hypertension    Neuromuscular disorder (HCC)    fibromyalgia   Neuropathy    Osteoporosis 01/2019   T score -2.2 stable/improved from prior study  Pelvic pain    Personal history of radiation therapy    Pneumonia    S/P radiation therapy 11/12/12 - 12/05/12   right Breast   Sepsis (HCC) 2014   from UTI    Sinus headache    Sjogren's disease (HCC)    Urgency of urination     Past Surgical History:  Procedure Laterality Date   32 HOUR PH STUDY N/A 04/03/2016   Procedure: 24 HOUR PH STUDY;  Surgeon: Ruffin Frederick, MD;  Location: [facility];  Service: Gastroenterology;  Laterality: N/A;   BREAST BIOPSY Right 08/23/2012   ADH   BREAST BIOPSY Right 10/11/2012   Ductal Carcinoma   BREAST EXCISIONAL BIOPSY Right 09/04/2013   benign   BREAST LUMPECTOMY Right 10/11/2012   W/ [facility] BX   CARDIAC CATHETERIZATION  09-13-2007  DR Riley Kill   WELL-PRESERVED LVF/  DIFFUSE SCATTERED CORONARY  CALCIFACATION AND ATHEROSCLEROSIS WITHOUT OBSTRUCTION   CARDIAC CATHETERIZATION  08-04-2010  DR MCALHANY   NON-OBSTRUCTIVE CAD/  pLAD 40%/  oLAD 30%/  mLAD 30%/  pRCA 30%/  EF 60%   CARDIOVASCULAR STRESS TEST  06-18-2012  DR McALHANY   LOW RISK NUCLEAR STUDY/  SMALL FIXED AREA OF MODERATELY DECREASED UPTAKE IN ANTEROSEPTAL WALL WHICH MAY BE ARTIFACTUAL/  NO ISCHEMIA/  EF 68%   COLONOSCOPY  09/29/2010   CYSTOSCOPY     CYSTOSCOPY WITH HYDRODISTENSION AND BIOPSY N/A 03/06/2014   Procedure: CYSTOSCOPY/HYDRODISTENSION/ INSTILATION OF MARCAINE AND PYRIDIUM;  Surgeon: Kathi Ludwig, MD;  Location: [facility];  Service: Urology;  Laterality: N/A;   CYSTOSCOPY/URETEROSCOPY/HOLMIUM LASER/STENT PLACEMENT Left 03/21/2023   Procedure: CYSTOSCOPY LEFT RETROGRADE PYELOGRAM, LEFT URETEROSCOPY, HOLMIUM LASER LITHOTRIPSYM, AND LEFT URETERAL STENT PLACEMENT;  Surgeon: Rene Paci, MD;  Location: [facility];  Service: Urology;  Laterality: Left;  60 MINUTES   ESOPHAGEAL MANOMETRY N/A 04/03/2016   Procedure: ESOPHAGEAL MANOMETRY (EM);  Surgeon: Ruffin Frederick, MD;  Location: [facility];  Service: Gastroenterology;  Laterality: N/A;   EXTRACORPOREAL SHOCK WAVE LITHOTRIPSY Left 02/06/2019   Procedure: EXTRACORPOREAL SHOCK WAVE LITHOTRIPSY (ESWL);  Surgeon: Ihor Gully, MD;  Location: [facility];  Service: Urology;  Laterality: Left;   NASAL SINUS SURGERY  1985   ORIF RIGHT ANKLE  FX  2006   POLYPECTOMY     REMOVAL VOCAL CORD CYST  01/2013   RIGHT BREAST BX  08/23/2012   RIGHT HAND SURGERY  X3  LAST ONE 2009   INCLUDES  ORIF RIGHT 5TH FINGER AND REVISION TWICE   SKIN CANCER EXCISION     face,arms   TONSILLECTOMY AND ADENOIDECTOMY  AGE 47   TOTAL ABDOMINAL HYSTERECTOMY W/ BILATERAL SALPINGOOPHORECTOMY  1982   W/  APPENDECTOMY   TRANSTHORACIC ECHOCARDIOGRAM  06/24/2012   GRADE I DIASTOLIC DYSFUNCTION/  EF 55-60%/  MILD MR   UPPER GASTROINTESTINAL ENDOSCOPY      Family  History  Problem Relation Age of Onset   Rectal cancer Mother    Colon cancer Mother    Pancreatic cancer Mother    Diabetes Mother    Breast cancer Mother 67   Breast cancer Maternal Aunt        breast   Birth defects Maternal Aunt    Irritable bowel syndrome Son    Heart disease Son        CAD, MV replacement   Allergic Disorder Daughter    Diabetes Daughter    Colon cancer Father    Colon polyps Father    Diabetes Father  Stroke Father    Heart disease Father        CHF   Hyperlipidemia Father    Hypertension Father    Arthritis Father    Breast cancer Paternal Aunt    Breast cancer Paternal Aunt    Arthritis Paternal Uncle    Allergic Disorder Daughter    Heart disease Cousin        CAD, had a blood clot after stenting   Esophageal cancer Neg Hx    Stomach cancer Neg Hx     Social History   Socioeconomic History   Marital status: Married    Spouse name: Windy Fast    Number of children: 3   Years of education: BA   Highest education level: Bachelor's degree (e.g., BA, AB, BS)  Occupational History   Occupation: Retired Teacher, adult education: RETIRED  Tobacco Use   Smoking status: Former    Packs/day: 1.00    Years: 15.00    Additional pack years: 0.00    Total pack years: 15.00    Types: Cigarettes    Quit date: 05/27/2005    Years since quitting: 18.0   Smokeless tobacco: Never  Vaping Use   Vaping Use: Never used  Substance and Sexual Activity   Alcohol use: No    Alcohol/week: 0.0 standard drinks of alcohol   Drug use: No   Sexual activity: Not Currently    Partners: Male    Birth control/protection: Surgical    Comment: 1st intercourse 77 yo-Fewer than 5 partners, hysterectomy  Other Topics Concern   Not on file  Social History Narrative   Lives with husband.   Caffeine use: 1/2 cup per day   Exercise-- 2days a week [employer],  Water aerobics, walking       Retired Engineer, civil (consulting)   No dietary restrictions, tries to maintain a heart healthy diet   Social  Determinants of Health   Financial Resource Strain: Low Risk  (05/01/2023)   Overall Financial Resource Strain (CARDIA)    Difficulty of Paying Living Expenses: Not hard at all  Food Insecurity: No Food Insecurity (05/01/2023)   Hunger Vital Sign    Worried About Running Out of Food in the Last Year: Never true    Ran Out of Food in the Last Year: Never true  Transportation Needs: No Transportation Needs (05/01/2023)   PRAPARE - Administrator, Civil Service (Medical): No    Lack of Transportation (Non-Medical): No  Physical Activity: Insufficiently Active (05/01/2023)   Exercise Vital Sign    Days of Exercise per Week: 1 day    Minutes of Exercise per Session: 30 min  Stress: Stress Concern Present (05/01/2023)   [employer] of Occupational Health - Occupational Stress Questionnaire    Feeling of Stress : To some extent  Social Connections: Unknown (05/01/2023)   Social Connection and Isolation Panel [NHANES]    Frequency of Communication with Friends and Family: More than three times a week    Frequency of Social Gatherings with Friends and Family: Patient declined    Attends Religious Services: Not on Insurance claims handler of Clubs or Organizations: Yes    Attends Banker Meetings: More than 4 times per year    Marital Status: Married  Catering manager Violence: Not At Risk (03/05/2023)   Humiliation, Afraid, Rape, and Kick questionnaire    Fear of Current or Ex-Partner: No    Emotionally Abused: No    Physically Abused: No  Sexually Abused: No    Outpatient Medications Prior to Visit  Medication Sig Dispense Refill   acetaminophen (TYLENOL) 500 MG tablet Take 1,000 mg by mouth every 6 (six) hours as needed for mild pain or headache.      albuterol (VENTOLIN HFA) 108 (90 Base) MCG/ACT inhaler Inhale 2 puffs into the lungs every 4 (four) hours as needed for wheezing or shortness of breath (coughing fits). 18 g 1   amLODipine (NORVASC) 5 MG tablet Take 1  tablet (5 mg total) by mouth daily. 90 tablet 3   aspirin EC 81 MG tablet Take 1 tablet (81 mg total) by mouth daily. 90 tablet 3   atenolol (TENORMIN) 25 MG tablet Take 1 tablet (25 mg total) by mouth 2 (two) times daily. Take 1 tablet by mouth 2 time a day 180 tablet 3   benzonatate (TESSALON PERLES) 100 MG capsule Take 1-2 capsules (100-200 mg total) by mouth 3 (three) times daily as needed for cough. 60 capsule 0   CALCIUM PO Take 500 mg by mouth daily.     carboxymethylcellul-glycerin (OPTIVE) 0.5-0.9 % ophthalmic solution Place 1 drop into both eyes daily as needed for dry eyes.     Cholecalciferol (VITAMIN D3) 50 MCG (2000 UT) CAPS Take 2,000 Units by mouth daily.     cyanocobalamin (VITAMIN B12) 1000 MCG tablet Take 1,000 mg by mouth daily with breakfast.     diclofenac Sodium (VOLTAREN) 1 % GEL Apply 2 g topically daily as needed (Back pain).     diphenhydrAMINE (BENADRYL) 25 mg capsule Take 25 mg by mouth every 6 (six) hours as needed for itching.     Docusate Sodium (DSS) 100 MG CAPS Take 100 mg by mouth daily as needed (Constipation).     DULoxetine (CYMBALTA) 30 MG capsule Take 1 capsule (30 mg total) by mouth daily. 30 capsule 1   EPINEPHrine 0.3 mg/0.3 mL IJ SOAJ injection Inject 0.3 mg into the muscle as needed for anaphylaxis. 2 each 1   famotidine (PEPCID) 20 MG tablet Take 1 tablet (20 mg total) by mouth 2 (two) times daily as needed for heartburn or indigestion. 60 tablet 1   fluticasone (FLONASE) 50 MCG/ACT nasal spray Use 2 spray(s) in each nostril once daily (Patient taking differently: Place 2 sprays into both nostrils daily as needed for allergies or rhinitis.) 16 g 5   fluticasone (FLOVENT HFA) 110 MCG/ACT inhaler Inhale 2 puffs into the lungs 2 (two) times daily. (Patient taking differently: Inhale 2 puffs into the lungs daily as needed (Short of breath).) 1 each 5   folic acid (FOLVITE) 1 MG tablet Take 1 tablet (1 mg total) by mouth daily.     furosemide (LASIX) 20 MG  tablet Take 1 tablet (20 mg total) by mouth as needed for edema. 90 tablet 3   gabapentin (NEURONTIN) 300 MG capsule Take 300-900 mg by mouth See admin instructions. Take 600 mg in the morning 300 mg at lunch time and 900 mg at bedtime     hydroxychloroquine (PLAQUENIL) 200 MG tablet Take 200 mg by mouth daily.     hydrOXYzine (ATARAX) 10 MG tablet Take 1 tablet (10 mg total) by mouth 3 (three) times daily as needed. 30 tablet 1   linaclotide (LINZESS) 145 MCG CAPS capsule Take 1 capsule (145 mcg total) by mouth daily as needed. Lot: [id number]  Exp:11-2023 12 capsule 0   loratadine (CLARITIN) 10 MG tablet Take 10 mg by mouth daily as needed for rhinitis.  Multiple Vitamins-Minerals (MULTIVITAMIN WITH MINERALS) tablet Take 1 tablet by mouth daily. (Patient taking differently: Take 1 tablet by mouth daily. 50+)     nitroGLYCERIN (NITROSTAT) 0.4 MG SL tablet Place 1 tablet (0.4 mg total) under the tongue every 5 (five) minutes as needed for chest pain. 25 tablet 1   nystatin cream (MYCOSTATIN) Apply 1 application topically 2 (two) times daily. (Patient taking differently: Apply 1 application  topically daily as needed (vaginal yeast).) 30 g 2   pentosan polysulfate (ELMIRON) 100 MG capsule Take 1 capsule (100 mg total) by mouth 3 (three) times daily. Reported on 01/05/2016 (Patient taking differently: Take 100 mg by mouth daily as needed (burning and pain). Reported on 01/05/2016) 90 capsule 11   polyethylene glycol (MIRALAX) 17 g packet Take 17 g by mouth 2 (two) times daily. 14 each 0   REPATHA SURECLICK 140 MG/ML SOAJ INJECT 140 MG INTO THE SKIN EVERY 14 DAYS 6 mL 0   sucralfate (CARAFATE) 1 GM/10ML suspension Take 10 mLs (1 g total) by mouth 4 (four) times daily -  with meals and at bedtime. (Patient taking differently: Take 1 g by mouth 2 (two) times daily.) 420 mL 1   sulfamethoxazole-trimethoprim (BACTRIM DS) 800-160 MG tablet Take 1 tablet by mouth 2 (two) times daily. 14 tablet 0   traMADol  (ULTRAM) 50 MG tablet Take 25 mg by mouth 2 (two) times daily.     Vitamin D, Ergocalciferol, (DRISDOL) 1.25 MG (50000 UNIT) CAPS capsule Take 1 capsule (50,000 Units total) by mouth every 7 (seven) days. 12x Weeks 5 capsule 3   DULoxetine (CYMBALTA) 60 MG capsule Take 1 capsule by mouth twice daily 180 capsule 0   pantoprazole (PROTONIX) 40 MG tablet Take 1 tablet (40 mg total) by mouth daily. 90 tablet 1   traZODone (DESYREL) 100 MG tablet Take 1 tablet (100 mg total) by mouth at bedtime. 90 tablet 1   No facility-administered medications prior to visit.    Allergies  Allergen Reactions   Clindamycin Hcl Shortness Of Breath and Rash   Penicillins Anaphylaxis   Rosuvastatin Anaphylaxis   Baclofen Other (See Comments) and Rash   Lincomycin Other (See Comments)   Prednisone Rash    Other reaction(s): Rash, Hives - can tolerate if she takes with benadryl    Codeine Hives and Other (See Comments)    headache Other reaction(s): Headache, Nausea, headache   Doxycycline     Skin rash   Erythromycin Hives   Haemophilus Influenzae Other (See Comments)    Local reaction at the site Local reaction at the site   Haemophilus Influenzae Vaccines Other (See Comments)    Local reaction at site   Latex Hives   Levofloxacin     Other reaction(s): sick   Other Other (See Comments)    Father had malignant hyperthermia   Pentazocine Lactate Other (See Comments)    HALLUCINATION   Pneumococcal Vaccine Polyvalent Hives, Swelling and Other (See Comments)    REACTION: redness, swelling, and hives at injection site   Tamoxifen Nausea And Vomiting and Other (See Comments)    HEADACHE Other reaction(s): Headache, Nausea   Fluzone [Influenza Virus Vaccine] Hives    Local reaction at the site    Review of Systems  Constitutional:  Positive for malaise/fatigue and weight loss. Negative for fever.  HENT:  Negative for congestion.   Eyes:  Negative for blurred vision.  Respiratory:  Positive for  cough and shortness of breath.   Cardiovascular:  Negative for chest pain, palpitations and leg swelling.  Gastrointestinal:  Positive for constipation and heartburn. Negative for abdominal pain, blood in stool and nausea.  Genitourinary:  Negative for dysuria and frequency.  Musculoskeletal:  Negative for falls.  Skin:  Negative for rash.  Neurological:  Positive for dizziness. Negative for loss of consciousness and headaches.  Endo/Heme/Allergies:  Negative for environmental allergies.  Psychiatric/Behavioral:  Negative for depression. The patient is not nervous/anxious.        Objective:    Physical Exam Constitutional:      General: She is not in acute distress.    Appearance: Normal appearance. She is well-developed. She is not toxic-appearing or diaphoretic.  HENT:     Head: Normocephalic and atraumatic.     Right Ear: Tympanic membrane, ear canal and external ear normal.     Left Ear: Tympanic membrane, ear canal and external ear normal.     Nose: Nose normal.     Mouth/Throat:     Mouth: Mucous membranes are moist.     Pharynx: Oropharynx is clear. No oropharyngeal exudate.  Eyes:     General: No scleral icterus.       Right eye: No discharge.        Left eye: No discharge.     Conjunctiva/sclera: Conjunctivae normal.     Pupils: Pupils are equal, round, and reactive to light.  Neck:     Thyroid: No thyromegaly.  Cardiovascular:     Rate and Rhythm: Normal rate and regular rhythm.     Heart sounds: Normal heart sounds. No murmur heard. Pulmonary:     Effort: Pulmonary effort is normal. No respiratory distress.     Breath sounds: Normal breath sounds. No wheezing or rales.  Abdominal:     General: Bowel sounds are normal. There is no distension.     Palpations: Abdomen is soft. There is no mass.     Tenderness: There is no abdominal tenderness. There is no guarding.  Musculoskeletal:        General: No tenderness. Normal range of motion.     Cervical back: Normal  range of motion and neck supple.  Lymphadenopathy:     Cervical: No cervical adenopathy.  Skin:    General: Skin is warm and dry.     Findings: No rash.  Neurological:     General: No focal deficit present.     Mental Status: She is alert and oriented to person, place, and time.     Cranial Nerves: No cranial nerve deficit.     Coordination: Coordination normal.     Deep Tendon Reflexes: Reflexes are normal and symmetric. Reflexes normal.  Psychiatric:        Mood and Affect: Mood normal.        Behavior: Behavior normal.        Thought Content: Thought content normal.        Judgment: Judgment normal.     BP 110/68 (BP Location: Left Arm, Patient Position: Sitting, Cuff Size: Normal)   Pulse 73   Temp 97.8 F (36.6 C) (Oral)   Resp 16   Ht 5\' 4"  (1.626 m)   Wt 126 lb 12.8 oz (57.5 kg)   SpO2 95%   BMI 21.77 kg/m  Wt Readings from Last 3 Encounters:  05/30/23 126 lb (57.2 kg)  05/28/23 126 lb 12.8 oz (57.5 kg)  05/22/23 126 lb (57.2 kg)    Diabetic Foot Exam - Simple   No data filed  Lab Results  Component Value Date   WBC 8.2 05/28/2023   HGB 11.3 (L) 05/28/2023   HCT 36.2 05/28/2023   PLT 301.0 05/28/2023   GLUCOSE 92 05/30/2023   CHOL 124 01/22/2023   TRIG 152.0 (H) 01/22/2023   HDL 45.00 01/22/2023   LDLDIRECT 117.3 04/08/2012   LDLCALC 48 01/22/2023   ALT 8 05/30/2023   AST 17 05/30/2023   NA 139 05/30/2023   K 4.7 05/30/2023   CL 102 05/30/2023   CREATININE 0.98 05/30/2023   BUN 15 05/30/2023   CO2 25 05/30/2023   TSH 1.46 05/01/2023   INR 1.05 08/03/2010   HGBA1C 5.5 05/28/2023   MICROALBUR 0.2 07/10/2013    Lab Results  Component Value Date   TSH 1.46 05/01/2023   Lab Results  Component Value Date   WBC 8.2 05/28/2023   HGB 11.3 (L) 05/28/2023   HCT 36.2 05/28/2023   MCV 88.4 05/28/2023   PLT 301.0 05/28/2023   Lab Results  Component Value Date   NA 139 05/30/2023   K 4.7 05/30/2023   CHLORIDE 102 04/20/2015   CO2 25  05/30/2023   GLUCOSE 92 05/30/2023   BUN 15 05/30/2023   CREATININE 0.98 05/30/2023   BILITOT 0.4 05/30/2023   ALKPHOS 105 05/30/2023   AST 17 05/30/2023   ALT 8 05/30/2023   PROT 6.7 05/30/2023   ALBUMIN 4.0 05/30/2023   CALCIUM 9.3 05/30/2023   ANIONGAP 7 04/27/2023   EGFR 49 (L) 04/20/2015   GFR 55.52 (L) 05/30/2023   Lab Results  Component Value Date   CHOL 124 01/22/2023   Lab Results  Component Value Date   HDL 45.00 01/22/2023   Lab Results  Component Value Date   LDLCALC 48 01/22/2023   Lab Results  Component Value Date   TRIG 152.0 (H) 01/22/2023   Lab Results  Component Value Date   CHOLHDL 3 01/22/2023   Lab Results  Component Value Date   HGBA1C 5.5 05/28/2023       Assessment & Plan:  Chronic renal impairment, unspecified CKD stage Assessment & Plan: Hydrate and monitor   Orders: -     Comprehensive metabolic panel  Essential hypertension Assessment & Plan: Well controlled, no changes to meds. Encouraged heart healthy diet such as the DASH diet and exercise as tolerated.     Hyperglycemia Assessment & Plan: hgba1c acceptable, minimize simple carbs. Increase exercise as tolerated. Continue current meds   Orders: -     Hemoglobin A1c  Hyperlipidemia, unspecified hyperlipidemia type Assessment & Plan: Does not tolerate statins   Constipation, unspecified constipation type Assessment & Plan: Miralax 1/2 cap bid for a couple of days. Is following with Dr Adela Lank.    Acute bronchitis with COPD (HCC) -     Pantoprazole Sodium; Take 1 tablet (40 mg total) by mouth 2 (two) times daily.  Dispense: 90 tablet; Refill: 1  Encounter for screening for lung cancer -     Ambulatory referral to Pulmonology  Cough, unspecified type -     Ambulatory referral to Pulmonology -     Ambulatory referral to Pulmonology  Dyspnea, unspecified type Assessment & Plan: With cough that is persistent, is referred to pulmonology for further  consideration  Orders: -     Ambulatory referral to Pulmonology -     Ambulatory referral to Pulmonology  Thrombocytosis -     CBC with Differential/Platelet  Vitamin D deficiency -     VITAMIN D 25 Hydroxy (Vit-D Deficiency,  Fractures)  Gastroesophageal reflux disease, unspecified whether esophagitis present Assessment & Plan: Avoid offending foods, start probiotics. Do not eat large meals in late evening and consider raising head of bed.  Given rx for Pantoprazole   Assessment and Plan    Chronic Cough and Shortness of Breath: Persistent cough and dyspnea despite inhaler use. Oxygen saturation at 95% on room air. -Refer to pulmonology for further evaluation and management.  Gastroesophageal Reflux Disease (GERD): Worsening symptoms despite Pantoprazole 40mg  twice daily and Famotidine 20mg  twice daily. -Continue current medications. -Add Carafate as needed for symptom relief. Marland Kitchenavoid offending foods and large meals -Follow up with gastroenterologist for upper GI endoscopy.  Unintentional Weight Loss: Lost 13-14 pounds over the past 6-8 months. BMI stable at 21. -Encourage increased fluid intake (60-80 ounces daily). -Consider reducing vitamin intake to potentially improve appetite. -Order blood work including CBC with differential and hemoglobin A1c. -Order repeat lung cancer screening with low-dose chest CT.  Dizziness and Balance Issues: Worsening dizziness and balance issues despite physical therapy. -Continue physical therapy. -Follow up with neurology next week.  Ear Pain: Persistent ear pain with no hearing changes. Patient complains about irritation on her tongue on right but no lesion noted today, encouraged to follow up with her dentist  Lower Extremity Edema: Intermittent lower extremity swelling. -Encourage use of compression socks during prolonged periods of standing or walking.  Follow up in a few months to reassess symptoms and review results of ordered  tests.       Danise Edge, MD

## 2023-05-29 NOTE — Addendum Note (Signed)
Addended by: Kathi Ludwig on: 05/29/2023 10:38 AM   Modules accepted: Orders

## 2023-05-30 ENCOUNTER — Other Ambulatory Visit: Payer: Self-pay | Admitting: Family Medicine

## 2023-05-30 ENCOUNTER — Encounter: Payer: Self-pay | Admitting: Physician Assistant

## 2023-05-30 ENCOUNTER — Other Ambulatory Visit (INDEPENDENT_AMBULATORY_CARE_PROVIDER_SITE_OTHER): Payer: Medicare Other

## 2023-05-30 ENCOUNTER — Ambulatory Visit: Payer: Medicare Other | Admitting: Physician Assistant

## 2023-05-30 ENCOUNTER — Ambulatory Visit: Payer: Medicare Other

## 2023-05-30 VITALS — BP 110/60 | HR 70 | Resp 18 | Ht 64.0 in | Wt 126.0 lb

## 2023-05-30 DIAGNOSIS — F322 Major depressive disorder, single episode, severe without psychotic features: Secondary | ICD-10-CM

## 2023-05-30 DIAGNOSIS — N189 Chronic kidney disease, unspecified: Secondary | ICD-10-CM

## 2023-05-30 DIAGNOSIS — R413 Other amnesia: Secondary | ICD-10-CM

## 2023-05-30 NOTE — Patient Instructions (Addendum)
It was a pleasure to see you today at our office.   Recommendations:  Neurocognitive evaluation at our office   MRI of the brain, the radiology office will call you to arrange you appointment   Referral to psychotherapy  Follow up Sept 18 11:30      For assessment of decision of mental capacity and competency:  Call Dr. Erick Blinks, geriatric psychiatrist at [phone]   Whom to call: Memory  decline, memory medications: Call our office [phone]  For psychiatric meds, mood meds: Please have your primary care physician manage these medications.  If you have any severe symptoms of a stroke, or other severe issues such as confusion,severe chills or fever, etc call 911 or go to the ER as you may need to be evaluated further    RECOMMENDATIONS FOR ALL PATIENTS WITH MEMORY PROBLEMS: 1. Continue to exercise (Recommend 30 minutes of walking everyday, or 3 hours every week) 2. Increase social interactions - continue going to [place] and enjoy social gatherings with friends and family 3. Eat healthy, avoid fried foods and eat more fruits and vegetables 4. Maintain adequate blood pressure, blood sugar, and blood cholesterol level. Reducing the risk of stroke and cardiovascular disease also helps promoting better memory. 5. Avoid stressful situations. Live a simple life and avoid aggravations. Organize your time and prepare for the next day in anticipation. 6. Sleep well, avoid any interruptions of sleep and avoid any distractions in the bedroom that may interfere with adequate sleep quality 7. Avoid sugar, avoid sweets as there is a strong link between excessive sugar intake, diabetes, and cognitive impairment We discussed the Mediterranean diet, which has been shown to help patients reduce the risk of progressive memory disorders and reduces cardiovascular risk. This includes eating fish, eat fruits and green leafy vegetables, nuts like almonds and hazelnuts, walnuts, and also use olive oil.  Avoid fast foods and fried foods as much as possible. Avoid sweets and sugar as sugar use has been linked to worsening of memory function.  There is always a concern of gradual progression of memory problems. If this is the case, then we may need to adjust level of care according to patient needs. Support, both to the patient and caregiver, should then be put into place.      You have been referred for a neuropsychological evaluation (i.e., evaluation of memory and thinking abilities). Please bring someone with you to this appointment if possible, as it is helpful for the doctor to hear from both you and another adult who knows you well. Please bring eyeglasses and hearing aids if you wear them.    The evaluation will take approximately 3 hours and has two parts:   The first part is a clinical interview with the neuropsychologist (Dr. Milbert Coulter or Dr. Roseanne Reno). During the interview, the neuropsychologist will speak with you and the individual you brought to the appointment.    The second part of the evaluation is testing with the doctor's technician Annabelle Harman or Selena Batten). During the testing, the technician will ask you to remember different types of material, solve problems, and answer some questionnaires. Your family member will not be present for this portion of the evaluation.   Please note: We must reserve several hours of the neuropsychologist's time and the psychometrician's time for your evaluation appointment. As such, there is a No-Show fee of $100. If you are unable to attend any of your appointments, please contact our office as soon as possible to reschedule.  FALL PRECAUTIONS: Be cautious when walking. Scan the area for obstacles that may increase the risk of trips and falls. When getting up in the mornings, sit up at the edge of the bed for a few minutes before getting out of bed. Consider elevating the bed at the head end to avoid drop of blood pressure when getting up. Walk always in a  well-lit room (use night lights in the walls). Avoid area rugs or power cords from appliances in the middle of the walkways. Use a walker or a cane if necessary and consider physical therapy for balance exercise. Get your eyesight checked regularly.  FINANCIAL OVERSIGHT: Supervision, especially oversight when making financial decisions or transactions is also recommended.  HOME SAFETY: Consider the safety of the kitchen when operating appliances like stoves, microwave oven, and blender. Consider having supervision and share cooking responsibilities until no longer able to participate in those. Accidents with firearms and other hazards in the house should be identified and addressed as well.   ABILITY TO BE LEFT ALONE: If patient is unable to contact 911 operator, consider using [employer], or when the need is there, arrange for someone to stay with patients. Smoking is a fire hazard, consider supervision or cessation. Risk of wandering should be assessed by caregiver and if detected at any point, supervision and safe proof recommendations should be instituted.  MEDICATION SUPERVISION: Inability to self-administer medication needs to be constantly addressed. Implement a mechanism to ensure safe administration of the medications.   DRIVING: Regarding driving, in patients with progressive memory problems, driving will be impaired. We advise to have someone else do the driving if trouble finding directions or if minor accidents are reported. Independent driving assessment is available to determine safety of driving.   If you are interested in the driving assessment, you can contact the following:  [facility] in [place] [phone]  [facility] [phone]  [facility] [phone] or [phone]    Mediterranean Diet A Mediterranean diet refers to food and lifestyle choices that are based on the traditions of  countries located on the [employer]. This way of eating has been shown to help prevent certain conditions and improve outcomes for people who have chronic diseases, like kidney disease and heart disease. What are tips for following this plan? Lifestyle  Cook and eat meals together with your family, when possible. Drink enough fluid to keep your urine clear or pale yellow. Be physically active every day. This includes: Aerobic exercise like running or swimming. Leisure activities like gardening, walking, or housework. Get 7-8 hours of sleep each night. If recommended by your health care provider, drink red wine in moderation. This means 1 glass a day for nonpregnant women and 2 glasses a day for men. A glass of wine equals 5 oz (150 mL). Reading food labels  Check the serving size of packaged foods. For foods such as rice and pasta, the serving size refers to the amount of cooked product, not dry. Check the total fat in packaged foods. Avoid foods that have saturated fat or trans fats. Check the ingredients list for added sugars, such as corn syrup. Shopping  At the grocery store, buy most of your food from the areas near the walls of the store. This includes: Fresh fruits and vegetables (produce). Grains, beans, nuts, and seeds. Some of these may be available in unpackaged forms or large amounts (in bulk). Fresh seafood. Poultry and eggs. Low-fat dairy products. Buy whole  ingredients instead of prepackaged foods. Buy fresh fruits and vegetables in-season from local farmers markets. Buy frozen fruits and vegetables in resealable bags. If you do not have access to quality fresh seafood, buy precooked frozen shrimp or canned fish, such as tuna, salmon, or sardines. Buy small amounts of raw or cooked vegetables, salads, or olives from the deli or salad bar at your store. Stock your pantry so you always have certain foods on hand, such as olive oil, canned tuna, canned tomatoes, rice,  pasta, and beans. Cooking  Cook foods with extra-virgin olive oil instead of using butter or other vegetable oils. Have meat as a side dish, and have vegetables or grains as your main dish. This means having meat in small portions or adding small amounts of meat to foods like pasta or stew. Use beans or vegetables instead of meat in common dishes like chili or lasagna. Experiment with different cooking methods. Try roasting or broiling vegetables instead of steaming or sauteing them. Add frozen vegetables to soups, stews, pasta, or rice. Add nuts or seeds for added healthy fat at each meal. You can add these to yogurt, salads, or vegetable dishes. Marinate fish or vegetables using olive oil, lemon juice, garlic, and fresh herbs. Meal planning  Plan to eat 1 vegetarian meal one day each week. Try to work up to 2 vegetarian meals, if possible. Eat seafood 2 or more times a week. Have healthy snacks readily available, such as: Vegetable sticks with hummus. Greek yogurt. Fruit and nut trail mix. Eat balanced meals throughout the week. This includes: Fruit: 2-3 servings a day Vegetables: 4-5 servings a day Low-fat dairy: 2 servings a day Fish, poultry, or lean meat: 1 serving a day Beans and legumes: 2 or more servings a week Nuts and seeds: 1-2 servings a day Whole grains: 6-8 servings a day Extra-virgin olive oil: 3-4 servings a day Limit red meat and sweets to only a few servings a month What are my food choices? Mediterranean diet Recommended Grains: Whole-grain pasta. Brown rice. Bulgar wheat. Polenta. Couscous. Whole-wheat bread. Orpah Cobb. Vegetables: Artichokes. Beets. Broccoli. Cabbage. Carrots. Eggplant. Green beans. Chard. Kale. Spinach. Onions. Leeks. Peas. Squash. Tomatoes. Peppers. Radishes. Fruits: Apples. Apricots. Avocado. Berries. Bananas. Cherries. Dates. Figs. Grapes. Lemons. Melon. Oranges. Peaches. Plums. Pomegranate. Meats and other protein foods: Beans.  Almonds. Sunflower seeds. Pine nuts. Peanuts. Cod. Salmon. Scallops. Shrimp. Tuna. Tilapia. Clams. Oysters. Eggs. Dairy: Low-fat milk. Cheese. Greek yogurt. Beverages: Water. Red wine. Herbal tea. Fats and oils: Extra virgin olive oil. Avocado oil. Grape seed oil. Sweets and desserts: [place] yogurt with honey. Baked apples. Poached pears. Trail mix. Seasoning and other foods: Basil. Cilantro. Coriander. Cumin. Mint. Parsley. Sage. Rosemary. Tarragon. Garlic. Oregano. Thyme. Pepper. Balsalmic vinegar. Tahini. Hummus. Tomato sauce. Olives. Mushrooms. Limit these Grains: Prepackaged pasta or rice dishes. Prepackaged cereal with added sugar. Vegetables: Deep fried potatoes (french fries). Fruits: Fruit canned in syrup. Meats and other protein foods: Beef. Pork. Lamb. Poultry with skin. Hot dogs. Tomasa Blase. Dairy: Ice cream. Sour cream. Whole milk. Beverages: Juice. Sugar-sweetened soft drinks. Beer. Liquor and spirits. Fats and oils: Butter. Canola oil. Vegetable oil. Beef fat (tallow). Lard. Sweets and desserts: Cookies. Cakes. Pies. Candy. Seasoning and other foods: Mayonnaise. Premade sauces and marinades. The items listed may not be a complete list. Talk with your dietitian about what dietary choices are right for you. Summary The Mediterranean diet includes both food and lifestyle choices. Eat a variety of fresh fruits and vegetables, beans, nuts, seeds,  and whole grains. Limit the amount of red meat and sweets that you eat. Talk with your health care provider about whether it is safe for you to drink red wine in moderation. This means 1 glass a day for nonpregnant women and 2 glasses a day for men. A glass of wine equals 5 oz (150 mL). This information is not intended to replace advice given to you by your health care provider. Make sure you discuss any questions you have with your health care provider. Document Released: 08/03/2016 Document Revised: 09/05/2016 Document Reviewed:  08/03/2016 Elsevier Interactive Patient Education  2017 [employer].      [place] [phone]

## 2023-05-30 NOTE — Progress Notes (Signed)
Assessment/Plan:   [name] is a very pleasant 77 y.o. year old RH female with a history of hypertension, hyperlipidemia, multiple drug allergies, polypharmacy, osteoporosis, depression, right breast cancer status post radiation therapy 2013, chronic pain and fibromyalgia, Sjogren's syndrome, COPD, CAD, history of chronic BPPV, seen today for evaluation of memory loss.  In the past, she had a neurocognitive testing, which was negative for neurodegenerative disease. MoCA today is 25/30. She is able to participate on her ADLS as tolerated given her history of vertigo and chronic pain that might limit her mobility. We discussed polypharmacy, which can contribute to her memory difficulties. We also discussed the role that AD plays on cognitive issues. She is interested in psychotherapy . Workup is in progress     Memory Impairment  MRI brain without contrast to assess for underlying structural abnormality and assess vascular load  Neurocognitive testing to further evaluate cognitive concerns and determine other underlying cause of memory changes, including potential contribution from sleep, anxiety, or depression  Continue to control mood as per PCP Recommend good control of cardiovascular risk factors Monitor driving Continue vestibular therapy for BPPV  Continue following up at [facility] for paresthesias Recommend discussion with clinical pharmacist her polypharmacy, which can contribute to memory concerns. Referral to behavioral therapy for possible PTSD, depression and anxiety Follow up in 3 months  Subjective:   The patient is here alone  How long did patient have memory difficulties? For the last 2 years . She feels her symptoms began after sustaining a fall and hitting her head. She reports chronic difficulty pulling out words, and has difficulty spelling.  "My pronunciation is awful. My mind says something before I can think about it". She reports that might forget how to end the  sentence and her husband finish it for her.    repeats oneself?  Endorsed Disoriented when walking into a room?  Patient denies except occasionally not remembering what patient came to the room for  . She may become lost in the stores.  Leaving objects in unusual places? Endorsed for the last 1-2 years .  "I lose stuff", sometimes in weird places, for ex something in the fridge was supposed to go in the pantry   Wandering behavior?  denies   Any personality changes?  Patient denies   Any history of depression?:  She has a history of depression. "I lost my first husband, he was killed". She reports emotional trauma , never ha sought psychotherapy.  Lately she gets tearful  more often.  Hallucinations or paranoia?  Endorsed had =it for a while after the kidney stones, she think it was due to meds, none since March  Seizures?   Patient denies    Any sleep changes?   Most of the time I have bad dreams  Denies REM behavior or sleepwalking.    Sleep apnea? " Had a sleep study that showed that the brain never stops".   Any hygiene concerns?  Patient denies   Independent of bathing and dressing?  Endorsed  Does the patient needs help with medications?  Patient is in charge   Who is in charge of the finances? Patient  is in charge     Any changes in appetite?  denies     Patient have trouble swallowing? denies   Does the patient cook?   Any kitchen accidents such as leaving the stove on? denies   Any headaches?   denies   Chronic back pain ? denies  Ambulates with difficulty?  "Because of my vertigo, she reports problems with gait, balance, and she reports that tilting the head to the R may worsen the symptoms. She is doing PT and VT.  Recent falls or head injuries? Not recently. Had a head injury several years ago. Vision changes? denies   Unilateral weakness, numbness or tingling?  She has complaints of balance issues, vertigo, falls, tremor, with prior negative studies at [facility].  She also has  chronic paresthesias, but has undergone extensive neuropathy studies at [facility], all yielding normal results. Any tremors?   When anxious  Any anosmia?  denies   Any incontinence of urine? denies   Any bowel dysfunction?  She has a history of chronic constipation.     Patient lives  with her husband  History of heavy alcohol intake? denies   History of heavy tobacco use? denies   Family history of dementia? denies Does patient drive? Endorsed, recently "Cannot remember where I am when driving". Sometimes she forgets how to get to places "I lost it this morning could not find the place ".    Past Medical History:  Diagnosis Date   Allergy    Anemia    Anxiety    past hx    Arthritis    Asthma    Atrial flutter (HCC)    past history- not current   Breast cancer (HCC)    CAD (coronary artery disease) CARDIOLOGIST--  DR Clifton James   mild non-obstructive cad   Cancer (HCC)    right   Cataract    bilaterally removed    Chronic constipation    Chronic kidney disease    stage 3 ckd, interstitial cystitis   Concussion    x 3   COPD (chronic obstructive pulmonary disease) (HCC)    Depression    past hx    Dyspnea    Dysrhythmia    Family history of adverse reaction to anesthesia    Family history of malignant hyperthermia    father had this   Fibromyalgia    Frequency of urination    GERD (gastroesophageal reflux disease)    H/O hiatal hernia    History of basal cell carcinoma excision    X2   History of breast cancer ONCOLOGIST-- DR Darnelle Catalan---  NO RECURRANCE   DX 07/2012;  LOW GRADE DCIS  ER+PR+  ----  S/P RIGHT LUMPECTOMY WITH NEGATIVE MARGINS/   RADIATION ENDED 11/2012   History of chronic bronchitis    History of colonic polyps    History of tachycardia    CONTROLLED  WITH ATENOLOL   Hyperlipidemia    Hypertension    Neuromuscular disorder (HCC)    fibromyalgia   Neuropathy    Osteoporosis 01/2019   T score -2.2 stable/improved from prior study   Pelvic pain     Personal history of radiation therapy    Pneumonia    S/P radiation therapy 11/12/12 - 12/05/12   right Breast   Sepsis (HCC) 2014   from UTI    Sinus headache    Sjogren's disease (HCC)    Urgency of urination      Past Surgical History:  Procedure Laterality Date   33 HOUR PH STUDY N/A 04/03/2016   Procedure: 24 HOUR PH STUDY;  Surgeon: Ruffin Frederick, MD;  Location: [facility];  Service: Gastroenterology;  Laterality: N/A;   BREAST BIOPSY Right 08/23/2012   ADH   BREAST BIOPSY Right 10/11/2012   Ductal Carcinoma   BREAST EXCISIONAL  BIOPSY Right 09/04/2013   benign   BREAST LUMPECTOMY Right 10/11/2012   W/ [facility] BX   CARDIAC CATHETERIZATION  09-13-2007  DR Riley Kill   WELL-PRESERVED LVF/  DIFFUSE SCATTERED CORONARY CALCIFACATION AND ATHEROSCLEROSIS WITHOUT OBSTRUCTION   CARDIAC CATHETERIZATION  08-04-2010  DR MCALHANY   NON-OBSTRUCTIVE CAD/  pLAD 40%/  oLAD 30%/  mLAD 30%/  pRCA 30%/  EF 60%   CARDIOVASCULAR STRESS TEST  06-18-2012  DR McALHANY   LOW RISK NUCLEAR STUDY/  SMALL FIXED AREA OF MODERATELY DECREASED UPTAKE IN ANTEROSEPTAL WALL WHICH MAY BE ARTIFACTUAL/  NO ISCHEMIA/  EF 68%   COLONOSCOPY  09/29/2010   CYSTOSCOPY     CYSTOSCOPY WITH HYDRODISTENSION AND BIOPSY N/A 03/06/2014   Procedure: CYSTOSCOPY/HYDRODISTENSION/ INSTILATION OF MARCAINE AND PYRIDIUM;  Surgeon: Kathi Ludwig, MD;  Location: [facility];  Service: Urology;  Laterality: N/A;   CYSTOSCOPY/URETEROSCOPY/HOLMIUM LASER/STENT PLACEMENT Left 03/21/2023   Procedure: CYSTOSCOPY LEFT RETROGRADE PYELOGRAM, LEFT URETEROSCOPY, HOLMIUM LASER LITHOTRIPSYM, AND LEFT URETERAL STENT PLACEMENT;  Surgeon: Rene Paci, MD;  Location: [facility];  Service: Urology;  Laterality: Left;  60 MINUTES   ESOPHAGEAL MANOMETRY N/A 04/03/2016   Procedure: ESOPHAGEAL MANOMETRY (EM);  Surgeon: Ruffin Frederick, MD;  Location: [facility];  Service: Gastroenterology;  Laterality: N/A;    EXTRACORPOREAL SHOCK WAVE LITHOTRIPSY Left 02/06/2019   Procedure: EXTRACORPOREAL SHOCK WAVE LITHOTRIPSY (ESWL);  Surgeon: Ihor Gully, MD;  Location: [facility];  Service: Urology;  Laterality: Left;   NASAL SINUS SURGERY  1985   ORIF RIGHT ANKLE  FX  2006   POLYPECTOMY     REMOVAL VOCAL CORD CYST  01/2013   RIGHT BREAST BX  08/23/2012   RIGHT HAND SURGERY  X3  LAST ONE 2009   INCLUDES  ORIF RIGHT 5TH FINGER AND REVISION TWICE   SKIN CANCER EXCISION     face,arms   TONSILLECTOMY AND ADENOIDECTOMY  AGE 53   TOTAL ABDOMINAL HYSTERECTOMY W/ BILATERAL SALPINGOOPHORECTOMY  1982   W/  APPENDECTOMY   TRANSTHORACIC ECHOCARDIOGRAM  06/24/2012   GRADE I DIASTOLIC DYSFUNCTION/  EF 55-60%/  MILD MR   UPPER GASTROINTESTINAL ENDOSCOPY       Allergies  Allergen Reactions   Clindamycin Hcl Shortness Of Breath and Rash   Penicillins Anaphylaxis   Rosuvastatin Anaphylaxis   Baclofen Other (See Comments) and Rash   Lincomycin Other (See Comments)   Prednisone Rash    Other reaction(s): Rash, Hives - can tolerate if she takes with benadryl    Codeine Hives and Other (See Comments)    headache Other reaction(s): Headache, Nausea, headache   Doxycycline     Skin rash   Erythromycin Hives   Haemophilus Influenzae Other (See Comments)    Local reaction at the site Local reaction at the site   Haemophilus Influenzae Vaccines Other (See Comments)    Local reaction at site   Latex Hives   Levofloxacin     Other reaction(s): sick   Other Other (See Comments)    Father had malignant hyperthermia   Pentazocine Lactate Other (See Comments)    HALLUCINATION   Pneumococcal Vaccine Polyvalent Hives, Swelling and Other (See Comments)    REACTION: redness, swelling, and hives at injection site   Tamoxifen Nausea And Vomiting and Other (See Comments)    HEADACHE Other reaction(s): Headache, Nausea   Fluzone [Influenza Virus Vaccine] Hives    Local reaction at the site    Current Outpatient  Medications  Medication Instructions   acetaminophen (TYLENOL)  1,000 mg, Oral, Every 6 hours PRN   albuterol (VENTOLIN HFA) 108 (90 Base) MCG/ACT inhaler 2 puffs, Inhalation, Every 4 hours PRN   amLODipine (NORVASC) 5 mg, Oral, Daily   aspirin EC 81 mg, Oral, Daily   atenolol (TENORMIN) 25 mg, Oral, 2 times daily, Take 1 tablet by mouth 2 time a day   benzonatate (TESSALON PERLES) 100-200 mg, Oral, 3 times daily PRN   CALCIUM PO 500 mg, Oral, Daily   carboxymethylcellul-glycerin (OPTIVE) 0.5-0.9 % ophthalmic solution 1 drop, Both Eyes, Daily PRN   cyanocobalamin (VITAMIN B12) 1,000 mg, Oral, Daily with breakfast   diclofenac Sodium (VOLTAREN) 2 g, Topical, Daily PRN   diphenhydrAMINE (BENADRYL) 25 mg, Oral, Every 6 hours PRN   DSS 100 mg, Oral, Daily PRN   DULoxetine (CYMBALTA) 30 mg, Oral, Daily   DULoxetine (CYMBALTA) 60 mg, Oral, 2 times daily   EPINEPHrine (EPI-PEN) 0.3 mg, Intramuscular, As needed   famotidine (PEPCID) 20 mg, Oral, 2 times daily PRN   fluticasone (FLONASE) 50 MCG/ACT nasal spray Use 2 spray(s) in each nostril once daily   fluticasone (FLOVENT HFA) 110 MCG/ACT inhaler 2 puffs, Inhalation, 2 times daily   folic acid (FOLVITE) 1 mg, Oral, Daily   furosemide (LASIX) 20 mg, Oral, As needed   gabapentin (NEURONTIN) 300-900 mg, Oral, See admin instructions, Take 600 mg in the morning 300 mg at lunch time and 900 mg at bedtime   hydroxychloroquine (PLAQUENIL) 200 mg, Oral, Daily   hydrOXYzine (ATARAX) 10 mg, Oral, 3 times daily PRN   linaclotide (LINZESS) 145 mcg, Oral, Daily PRN, Lot: [id number]  Exp:11-2023   loratadine (CLARITIN) 10 mg, Oral, Daily PRN   Multiple Vitamins-Minerals (MULTIVITAMIN WITH MINERALS) tablet 1 tablet, Oral, Daily   nitroGLYCERIN (NITROSTAT) 0.4 mg, Sublingual, Every 5 min PRN   nystatin cream (MYCOSTATIN) 1 application , Topical, 2 times daily   pantoprazole (PROTONIX) 40 mg, Oral, 2 times daily   pentosan polysulfate (ELMIRON) 100 mg, Oral, 3  times daily, Reported on 01/05/2016   polyethylene glycol (MIRALAX) 17 g, Oral, 2 times daily   REPATHA SURECLICK 140 MG/ML SOAJ INJECT 140 MG INTO THE SKIN EVERY 14 DAYS   sucralfate (CARAFATE) 1 g, Oral, 3 times daily with meals & bedtime   sulfamethoxazole-trimethoprim (BACTRIM DS) 800-160 MG tablet 1 tablet, Oral, 2 times daily   traMADol (ULTRAM) 25 mg, Oral, 2 times daily   traZODone (DESYREL) 100 mg, Oral, Daily at bedtime   Vitamin D (Ergocalciferol) (DRISDOL) 50,000 Units, Oral, Every 7 days, 12x Weeks   Vitamin D3 2,000 Units, Oral, Daily     VITALS:   Vitals:   05/30/23 0934  BP: 110/60  Pulse: 70  Resp: 18  SpO2: 98%  Weight: 126 lb (57.2 kg)  Height: 5\' 4"  (1.626 m)      PHYSICAL EXAM   HEENT:  Normocephalic, atraumatic. The mucous membranes are moist. The superficial temporal arteries are without ropiness or tenderness. Cardiovascular: Regular rate and rhythm. Lungs: Clear to auscultation bilaterally. Neck: There are no carotid bruits noted bilaterally.  NEUROLOGICAL:    05/30/2023    5:00 PM  Montreal Cognitive Assessment   Visuospatial/ Executive (0/5) 3  Naming (0/3) 2  Attention: Read list of digits (0/2) 2  Attention: Read list of letters (0/1) 1  Attention: Serial 7 subtraction starting at 100 (0/3) 3  Language: Repeat phrase (0/2) 2  Language : Fluency (0/1) 1  Abstraction (0/2) 1  Delayed Recall (0/5) 4  Orientation (0/6) 6  Total 25  Adjusted Score (based on education) 25       07/26/2016   10:10 AM  MMSE - Mini Mental State Exam  Orientation to time 4  Orientation to Place 4  Registration 3  Attention/ Calculation 5  Recall 2  Language- name 2 objects 2  Language- repeat 1  Language- follow 3 step command 2  Language- read & follow direction 1  Write a sentence 1  Copy design 0  Total score 25     Orientation:  Alert and oriented to person, place and time. No aphasia or dysarthria.  Fund of knowledge is appropriate. Recent memory  impaired and remote memory intact.  Attention and concentration are normal.  Able to name objects and repeat phrases. Delayed recall 4/5 Cranial nerves: There is good facial symmetry. Extraocular muscles are intact and visual fields are full to confrontational testing. Speech is fluent and clear. No tongue deviation. Hearing is intact to conversational tone. Tone: Tone is good throughout. Sensation: Sensation is decreased to light touch and vibration at B feet Coordination: The patient has no difficulty with RAM's or FNF bilaterally. Normal finger to nose  Motor: Strength is 5/5 in the bilateral upper and lower extremities. There is no pronator drift. There are no fasciculations noted. DTR's: Deep tendon reflexes are 1/4 at the bilateral biceps, triceps, brachioradialis, patella and achilles.  Plantar responses are downgoing bilaterally. Gait and Station: The patient is able to ambulate with difficulty cautious and narrow gait unable to heel toe  walk and Romberg     Thank you for allowing [place] the opportunity to participate in the care of this nice patient. Please do not hesitate to contact us for any questions or concerns.   Total time spent on today's visit was 69 minutes dedicated to this patient today, preparing to see patient, examining the patient, ordering tests and/or medications and counseling the patient, documenting clinical information in the EHR or other health record, independently interpreting results and communicating results to the patient/family, discussing treatment and goals, answering patient's questions and coordinating care.  Cc:  Bradd Canary, MD  Marlowe Kays 05/30/2023 5:25 PM

## 2023-05-31 LAB — COMPREHENSIVE METABOLIC PANEL
ALT: 8 U/L (ref 0–35)
AST: 17 U/L (ref 0–37)
Albumin: 4 g/dL (ref 3.5–5.2)
Alkaline Phosphatase: 105 U/L (ref 39–117)
BUN: 15 mg/dL (ref 6–23)
CO2: 25 mEq/L (ref 19–32)
Calcium: 9.3 mg/dL (ref 8.4–10.5)
Chloride: 102 mEq/L (ref 96–112)
Creatinine, Ser: 0.98 mg/dL (ref 0.40–1.20)
GFR: 55.52 mL/min — ABNORMAL LOW (ref >60.00)
Glucose, Bld: 92 mg/dL (ref 70–99)
Potassium: 4.7 mEq/L (ref 3.5–5.1)
Sodium: 139 mEq/L (ref 135–145)
Total Bilirubin: 0.4 mg/dL (ref 0.2–1.2)
Total Protein: 6.7 g/dL (ref 6.0–8.3)

## 2023-06-01 ENCOUNTER — Encounter: Payer: Self-pay | Admitting: Rehabilitative and Restorative Service Providers"

## 2023-06-01 ENCOUNTER — Ambulatory Visit: Payer: Medicare Other | Attending: Family Medicine | Admitting: Rehabilitative and Restorative Service Providers"

## 2023-06-01 DIAGNOSIS — R2681 Unsteadiness on feet: Secondary | ICD-10-CM | POA: Diagnosis not present

## 2023-06-01 DIAGNOSIS — R42 Dizziness and giddiness: Secondary | ICD-10-CM | POA: Insufficient documentation

## 2023-06-01 DIAGNOSIS — M6281 Muscle weakness (generalized): Secondary | ICD-10-CM | POA: Diagnosis not present

## 2023-06-01 DIAGNOSIS — H8112 Benign paroxysmal vertigo, left ear: Secondary | ICD-10-CM | POA: Insufficient documentation

## 2023-06-01 NOTE — Therapy (Signed)
OUTPATIENT PHYSICAL THERAPY NEURO TREATMENT   Patient Name: [name] MRN: [id number] DOB:1946/03/06, 77 y.o., female Today's Date: 06/01/2023  PCP: Danise Edge, MD REFERRING PROVIDER: Danise Edge, MD END OF SESSION:  PT End of Session - 06/01/23 1017     Visit Number 7    Number of Visits 16    Date for PT Re-Evaluation 07/05/23    Authorization Type [employer] medicare    Progress Note Due on Visit 10    PT Start Time 1017    PT Stop Time 1055    PT Time Calculation (min) 38 min    Equipment Utilized During Treatment --    Activity Tolerance Patient tolerated treatment well    Behavior During Therapy WFL for tasks assessed/performed            Past Medical History:  Diagnosis Date   Allergy    Anemia    Anxiety    past hx    Arthritis    Asthma    Atrial flutter (HCC)    past history- not current   Breast cancer (HCC)    CAD (coronary artery disease) CARDIOLOGIST--  DR Clifton James   mild non-obstructive cad   Cancer (HCC)    right   Cataract    bilaterally removed    Chronic constipation    Chronic kidney disease    stage 3 ckd, interstitial cystitis   Concussion    x 3   COPD (chronic obstructive pulmonary disease) (HCC)    Depression    past hx    Dyspnea    Dysrhythmia    Family history of adverse reaction to anesthesia    Family history of malignant hyperthermia    father had this   Fibromyalgia    Frequency of urination    GERD (gastroesophageal reflux disease)    H/O hiatal hernia    History of basal cell carcinoma excision    X2   History of breast cancer ONCOLOGIST-- DR Darnelle Catalan---  NO RECURRANCE   DX 07/2012;  LOW GRADE DCIS  ER+PR+  ----  S/P RIGHT LUMPECTOMY WITH NEGATIVE MARGINS/   RADIATION ENDED 11/2012   History of chronic bronchitis    History of colonic polyps    History of tachycardia    CONTROLLED  WITH ATENOLOL   Hyperlipidemia    Hypertension    Neuromuscular disorder (HCC)    fibromyalgia   Neuropathy    Osteoporosis  01/2019   T score -2.2 stable/improved from prior study   Pelvic pain    Personal history of radiation therapy    Pneumonia    S/P radiation therapy 11/12/12 - 12/05/12   right Breast   Sepsis (HCC) 2014   from UTI    Sinus headache    Sjogren's disease (HCC)    Urgency of urination    Past Surgical History:  Procedure Laterality Date   7 HOUR PH STUDY N/A 04/03/2016   Procedure: 24 HOUR PH STUDY;  Surgeon: Ruffin Frederick, MD;  Location: [facility];  Service: Gastroenterology;  Laterality: N/A;   BREAST BIOPSY Right 08/23/2012   ADH   BREAST BIOPSY Right 10/11/2012   Ductal Carcinoma   BREAST EXCISIONAL BIOPSY Right 09/04/2013   benign   BREAST LUMPECTOMY Right 10/11/2012   W/ SLN BX   CARDIAC CATHETERIZATION  09-13-2007  DR Riley Kill   WELL-PRESERVED LVF/  DIFFUSE SCATTERED CORONARY CALCIFACATION AND ATHEROSCLEROSIS WITHOUT OBSTRUCTION   CARDIAC CATHETERIZATION  08-04-2010  DR Clifton James   NON-OBSTRUCTIVE CAD/  pLAD 40%/  oLAD 30%/  mLAD 30%/  pRCA 30%/  EF 60%   CARDIOVASCULAR STRESS TEST  06-18-2012  DR McALHANY   LOW RISK NUCLEAR STUDY/  SMALL FIXED AREA OF MODERATELY DECREASED UPTAKE IN ANTEROSEPTAL WALL WHICH MAY BE ARTIFACTUAL/  NO ISCHEMIA/  EF 68%   COLONOSCOPY  09/29/2010   CYSTOSCOPY     CYSTOSCOPY WITH HYDRODISTENSION AND BIOPSY N/A 03/06/2014   Procedure: CYSTOSCOPY/HYDRODISTENSION/ INSTILATION OF MARCAINE AND PYRIDIUM;  Surgeon: Kathi Ludwig, MD;  Location: [facility];  Service: Urology;  Laterality: N/A;   CYSTOSCOPY/URETEROSCOPY/HOLMIUM LASER/STENT PLACEMENT Left 03/21/2023   Procedure: CYSTOSCOPY LEFT RETROGRADE PYELOGRAM, LEFT URETEROSCOPY, HOLMIUM LASER LITHOTRIPSYM, AND LEFT URETERAL STENT PLACEMENT;  Surgeon: Rene Paci, MD;  Location: [facility];  Service: Urology;  Laterality: Left;  60 MINUTES   ESOPHAGEAL MANOMETRY N/A 04/03/2016   Procedure: ESOPHAGEAL MANOMETRY (EM);  Surgeon: Ruffin Frederick, MD;   Location: [facility];  Service: Gastroenterology;  Laterality: N/A;   EXTRACORPOREAL SHOCK WAVE LITHOTRIPSY Left 02/06/2019   Procedure: EXTRACORPOREAL SHOCK WAVE LITHOTRIPSY (ESWL);  Surgeon: Ihor Gully, MD;  Location: [facility];  Service: Urology;  Laterality: Left;   NASAL SINUS SURGERY  1985   ORIF RIGHT ANKLE  FX  2006   POLYPECTOMY     REMOVAL VOCAL CORD CYST  01/2013   RIGHT BREAST BX  08/23/2012   RIGHT HAND SURGERY  X3  LAST ONE 2009   INCLUDES  ORIF RIGHT 5TH FINGER AND REVISION TWICE   SKIN CANCER EXCISION     face,arms   TONSILLECTOMY AND ADENOIDECTOMY  AGE 66   TOTAL ABDOMINAL HYSTERECTOMY W/ BILATERAL SALPINGOOPHORECTOMY  1982   W/  APPENDECTOMY   TRANSTHORACIC ECHOCARDIOGRAM  06/24/2012   GRADE I DIASTOLIC DYSFUNCTION/  EF 55-60%/  MILD MR   UPPER GASTROINTESTINAL ENDOSCOPY     Patient Active Problem List   Diagnosis Date Noted   Multiple drug allergies 05/09/2023   Recurrent infections 05/09/2023   Rash and other nonspecific skin eruption 05/09/2023   Local reaction to hymenoptera sting 05/09/2023   Allergic reaction 05/09/2023   Other adverse food reactions, not elsewhere classified, subsequent encounter 05/09/2023   Moderate persistent asthma without complication 05/09/2023   Other allergic rhinitis 05/09/2023   Weight loss 05/01/2023   Strep pharyngitis 04/24/2023   Common bile duct dilatation 02/13/2023   Oral herpes 02/05/2023   Tongue lesion 02/05/2023   Drug reaction 02/05/2023   Cellulitis of breast 02/05/2023   Elevated alkaline phosphatase level 01/25/2023   Facial trauma 01/22/2023   Vertigo 01/22/2023   Intertrigo 12/13/2022   Hyperkalemia 09/28/2022   Anemia 09/28/2022   Tremor 07/05/2022   RUQ pain 07/05/2022   Leukocytosis 07/05/2022   Chronic renal insufficiency 03/21/2022   Sjogren's disease (HCC) 12/12/2021   Vocal cord cyst 05/18/2021   High serum vitamin D 05/18/2021   Dysuria 03/09/2021   Vitamin B12 deficiency 03/08/2021    Preventative health care 03/08/2021   Hyperglycemia 03/08/2021   Low back pain 09/08/2020   OSA and COPD overlap syndrome (HCC) 06/10/2020   Recurrent falls 05/02/2020   Statin intolerance 02/29/2020   RLS (restless legs syndrome) 11/09/2019   Kidney stone 02/26/2019   Recurrent sinusitis 02/25/2019   Hx of colonic polyp 02/25/2019   DDD (degenerative disc disease), cervical 09/17/2018   Degenerative scoliosis 04/22/2018   Primary insomnia 06/25/2017   Chronic interstitial cystitis 02/01/2017   Chronic cough 01/09/2017   Right arm pain 07/20/2016   Deviated septum 05/12/2016  Nasal turbinate hypertrophy 05/12/2016   Dysphagia    Allergic rhinitis 01/25/2016   Other and combined forms of senile cataract 07/15/2015   Dyspnea    Left hip pain 04/30/2015   Sciatica 04/30/2015   Knee pain, right 04/30/2015   Bilateral carpal tunnel syndrome 03/04/2015   Hyperlipidemia 03/01/2015   Pulmonary nodules 02/12/2015   External hemorrhoids 02/05/2015   Chronic night sweats 01/08/2015   Atypical chest pain 01/01/2015   Memory impairment 05/22/2014   Peripheral neuropathy 05/22/2014   Abdominal pain 10/26/2013   Headache 10/13/2013   Arthralgia 08/18/2013   ANA positive 07/29/2013   Depression 02/05/2013   Family history of malignant hyperthermia 02/05/2013   Malignant neoplasm of lower-outer quadrant of right breast of female, estrogen receptor positive (HCC) 01/03/2013   Splenic lesion 09/02/2012   Asthma, allergic 08/05/2012   Acute bronchitis with COPD (HCC) 06/03/2012   GERD (gastroesophageal reflux disease) 06/03/2012   Edema 04/08/2012   Stricture and stenosis of esophagus 10/19/2011   Constipation 07/21/2011   Nonspecific (abnormal) findings on radiological and other examination of biliary tract 07/21/2011   Osteoporosis 04/17/2011   Leg pain 02/24/2011   FIBROCYSTIC BREAST DISEASE, HX OF 10/18/2010   Essential hypertension 08/25/2010   CAD in native artery 08/25/2010    TOBACCO ABUSE, HX OF 08/25/2010   GENERALIZED ANXIETY DISORDER 08/03/2010   Breast pain 01/11/2010   Fibromyalgia 01/11/2010    ONSET DATE: 04/24/23 REFERRING DIAG:  R42 (ICD-10-CM) - Dizzy spells  R53.1 (ICD-10-CM) - Weakness   THERAPY DIAG:  Dizziness and giddiness  Unsteadiness on feet  Muscle weakness (generalized)  Rationale for Evaluation and Treatment: Rehabilitation  SUBJECTIVE:                                                                                                                                                                                           SUBJECTIVE STATEMENT: The patient reports her dizziness is improved, but her back is hurting. "I feel a little foggy with a HA, but no dizziness." Pt accompanied by: self PERTINENT HISTORY: chronic back pain, (scheduled for back ablation in June 2024), h/o vertigo PAIN:  Are you having pain? Yes: NPRS scale: notes mild back pain Pain location: low back  Pain description: chronic Aggravating factors: PT will monitor Relieving factors: *has upcoming intervention scheduled PRECAUTIONS: Fall WEIGHT BEARING RESTRICTIONS: No FALLS: Has patient fallen in last 6 months? Yes. Number of falls 1.  LIVING ENVIRONMENT: Lives with: lives with their spouse Lives in: House/apartment Stairs: One step with one rail PLOF: Independent with basic ADLs-- notes declining mobility.  PATIENT GOALS: "stop the spinning and be able to  walk and not falter."  OBJECTIVE:  (Measures in this section from initial evaluation unless otherwise noted) COGNITION: Overall cognitive status:  Patient mentions memory changes since first fall where she hit her head -- she is unable to recall date SENSATION:  Patient reports numbness and cold sensations in feet. LOWER EXTREMITY MMT:   MMT Right Eval Left Eval  Hip flexion 3+/5 4/5  Knee flexion 4/5 4/5  Knee extension 5/5 5/5  Ankle dorsiflexion 3+/5 3+/5  Ankle plantarflexion    Ankle  inversion    Ankle eversion    (Blank rows = not tested) BED MOBILITY:  Sit to supine Complete Independence Supine to sit Complete Independence Rolling to Right Complete Independence Rolling to Left Complete Independence *notes "I'm in a fog" with returning to sitting GAIT: Gait pattern:  slowed pace with SPC and wide BOS Distance walked: 100 ft Assistive device utilized: Single point cane FUNCTIONAL TESTS:  Berg= 40/56  UPDATE: 50/56 ON 06/01/23 5 time sit to stand=20.46 seconds with Ues on legs 74M=0.64 m/sec or 2.10 ft/sec  UPDATE: 3.44 ft/sec ON 06/01/23 PATIENT SURVEYS:  FOTO n/a--not measured  due to vertigo/balance  VESTIBULAR ASSESSMENT: GENERAL OBSERVATION: Gets intermittent dizziness SYMPTOM BEHAVIOR:  Subjective history: Has intermittent h/o vertigo  Non-Vestibular symptoms:  none  Type of dizziness: Spinning/Vertigo, Unsteady with head/body turns, and Lightheadedness/Faint  Frequency: daily   Duration: seconds to minutes  Aggravating factors: Worse in the morning and with getting up after sitting for longer periods  Relieving factors: unsure  Progression of symptoms: unchanged OCULOMOTOR EXAM:  Ocular Alignment: normal  Ocular ROM: No Limitations  Spontaneous Nystagmus: absent  Gaze-Induced Nystagmus: absent  Smooth Pursuits: intact but notes target seems "out of reach"   Saccades: intact VESTIBULAR - OCULAR REFLEX:   Slow VOR: Comment: 2 reps provokes a sensation of bad spinning  Head-Impulse Test:  Did not test today due to inability to tolerate slow VOR  Dynamic Visual Acuity: to be assessed POSITIONAL TESTING: Right Dix-Hallpike: no nystagmus Left Dix-Hallpike: no nystagmus Right Roll Test: TBA Left Roll Test: TBA Right Sidelying: no nystagmus Left Sidelying: no nystagmus *Notes symptoms with return to sitting.   [facility] Adult PT Treatment:                                                DATE: 06/01/23 Therapeutic Exercise: Sit<>stand without UE support x 10  reps Neuromuscular re-ed: Berg=50/56 today  Habituation with horizontal head turns in seated with minimal symptoms Habituation standing with horizontal head turns in standing with minimal dizziness Gait: 3.44 ft/sec With SPC x 100 ft  Self Care: Recommended/discussed beach trip and safety with walking-- using device and having family nearby.   [facility] Adult PT Treatment:                                                DATE: 05/25/23  Therapeutic Exercise: Standing Wall squats with ball between knees x 10 reps Wall squats mini without ball x 10 reps Palloff press with green band with CGA x 10 reps R and L sides Seated Red band with pelvic rocking anterior/posterior tilting Sit<>stand x 5 reps without UE support Supine Legs supported on physioball Lumbar rotation Manual Therapy: Gentle PA  mobs lumbar spine and SI Neuromuscular re-ed: Seated Habituation Head rotation x 5 reps Gaze x 1 viewing x 10 reps with small head motion horizontal plane Gaze x 1 viewing x 10 reps vertical plane Standing Marching in place with cues to avoid lumbar posterior tilt x 10 reps without UE support with CGA Sidestepping x 10 feet x 4 reps with cues on core engagement Gait: Ambulation without SPC in clinic x 80 feet x 3 reps mod indep   [facility] Adult PT Treatment:                                                DATE: 05/23/23 Therapeutic Exercise: Supine Bent knee fallouts x 10 reps R and L sides Marching alternating x 10 reps Hip adductor isometrics with ball squeeze Pelvic tilts x 5 reps SLR x 5 reps R and L  Gentle lumbar rocking with arms in T position x 10 reps Sitting Sit to stand x 10 reps dec'ing UE support "stomp and stand" to encourage powering up Yellow t-band scap retraction x 10 reps x 2 sets Rib flare with tactile cues for deep breathing (patient noting she is having coughing spells and feels tight) Neuromuscular re-ed: Sitting Gaze x 1 adaptation x 10 reps with increased dizziness in  horizontal plane Gaze x 1 adaptation x 10 reps with increased dizziness in vertical plane (worse vertical for symptom rating) Habituation with horizontal head turns x 5 reps and rest Standing Marching alternating with cues for core engagement x 10 reps Single leg standing Wall bumps-- increased pain, therefore held activity Heel raises for balance Alternating foot taps to 4" step Up/up, down/down x 5 reps R and L sides leading Gait: Without Device x 30 feet x 3 reps  PATIENT EDUCATION: Education details: HEP Person educated: Patient Education method: Explanation Education comprehension: verbalized understanding  HOME EXERCISE PROGRAM: Access Code: [email or web address] URL: [email or web address] Date: 06/01/2023 Prepared by: Margretta Ditty  Exercises - Sit to Stand with Armchair  - 2 x daily - 7 x weekly - 1 sets - 5 reps - Standing with Head Rotation  - 2 x daily - 7 x weekly - 1 sets - 10 reps - Seated Gaze Stabilization with Head Rotation  - 2 x daily - 7 x weekly - 1 sets - 10 reps - Seated Single Leg Hip Abduction with Resistance  - 1 x daily - 7 x weekly - 3 sets - 10 reps - Supine Lower Trunk Rotation  - 2 x daily - 7 x weekly - 1 sets - 10 reps  GOALS: Goals reviewed with patient? Yes  SHORT TERM GOALS: Target date: 06/06/23  Patient will be independent with initial HEP.  Baseline: To be given Goal status: MET  2.  Patient will complete Berg, and FGA if indicated. Baseline: 40/56 (Berg) Goal status: MET   3.  Patient will report decreased dizziness by 50%. Baseline: n/a Goal status: MET  LONG TERM GOALS:  Target date: 07/05/23  Patient will be independent with advanced/ongoing HEP to improve outcomes and carryover.  Baseline: to be established. Goal status: INITIAL  2.  Patient will improve Berg balance score by 5 points to demo improved functional balance.  Baseline: 40/56; Berg=50/56  Goal status: MET ON 06/01/23  3. Patient will demo floor<>stand  with UE support mod indep for improved functional strength.  Baseline:  has h/o falls.  Goal status: INITIAL  4. Patient will improve gait speed to 2.8 ft/sec to demo improving mobility and dec'd risk for falls.  Baseline: 3.44 ft/sec  Goal status: MET ON 06/01/23  ASSESSMENT:  CLINICAL IMPRESSION: Patient has met 2 LTGs for physical therapy. She continues with improvement in dizziness and balance-- also noted through objective measures. PT to continue to work on single leg stance control, trial floor transfers (on mat) with UE support and discuss in context of gardening. PT to continue to progress to LTGs within patient's tolerance and monitoring chronic low back pain.   OBJECTIVE IMPAIRMENTS: Abnormal gait, decreased activity tolerance, decreased balance, decreased strength, dizziness, impaired sensation, and pain.   PLAN:  PT FREQUENCY: 2x/week  PT DURATION: 8 weeks  PLANNED INTERVENTIONS: Therapeutic exercises, Therapeutic activity, Neuromuscular re-education, Balance training, Gait training, Patient/Family education, Self Care, Joint mobilization, Vestibular training, Canalith repositioning, Aquatic Therapy, Dry Needling, Electrical stimulation, Moist heat, Taping, and Manual therapy  PLAN FOR NEXT SESSION: Progress gait activities to tolerance, LE strength, balance. Check floor<>stand (does in gardening), work on standing corner balance to progress for home (narrowing BOS with head turns), single leg control, turning towards the right. After beach trip plan to renew with goals of turning, single leg standing, and high level balance tasks.  Jerri Hargadon, PT 06/01/2023 2:00 PM

## 2023-06-04 ENCOUNTER — Encounter: Payer: Self-pay | Admitting: Family Medicine

## 2023-06-04 NOTE — Assessment & Plan Note (Signed)
With cough that is persistent, is referred to pulmonology for further consideration

## 2023-06-04 NOTE — Assessment & Plan Note (Signed)
Avoid offending foods, start probiotics. Do not eat large meals in late evening and consider raising head of bed.  Given rx for Pantoprazole

## 2023-06-06 ENCOUNTER — Ambulatory Visit: Payer: Medicare Other

## 2023-06-06 LAB — ALLERGENS W/TOTAL IGE AREA 2
Alternaria Alternata IgE: <0.1 kU/L
Aspergillus Fumigatus IgE: <0.1 kU/L
Bermuda Grass IgE: 0.11 kU/L — AB
Cat Dander IgE: <0.1 kU/L
Cedar, Mountain IgE: 0.46 kU/L — AB
Cladosporium Herbarum IgE: <0.1 kU/L
Cockroach, German IgE: <0.1 kU/L
Common Silver Birch IgE: 0.18 kU/L — AB
Cottonwood IgE: 0.72 kU/L — AB
D Farinae IgE: <0.1 kU/L
D Pteronyssinus IgE: <0.1 kU/L
Dog Dander IgE: <0.1 kU/L
Elm, American IgE: 0.23 kU/L — AB
Johnson Grass IgE: 0.18 kU/L — AB
Maple/Box Elder IgE: 0.28 kU/L — AB
Mouse Urine IgE: <0.1 kU/L
Oak, White IgE: 0.16 kU/L — AB
Pecan, Hickory IgE: 0.2 kU/L — AB
Penicillium Chrysogen IgE: <0.1 kU/L
Pigweed, Rough IgE: 0.19 kU/L — AB
Ragweed, Short IgE: 0.13 kU/L — AB
Sheep Sorrel IgE Qn: <0.1 kU/L
Timothy Grass IgE: 0.27 kU/L — AB
White Mulberry IgE: 0.12 kU/L — AB

## 2023-06-06 LAB — CBC WITH DIFFERENTIAL/PLATELET
Basophils Absolute: 0.1 10*3/uL (ref 0.0–0.2)
Basos: 1 %
EOS (ABSOLUTE): 0.5 10*3/uL — ABNORMAL HIGH (ref 0.0–0.4)
Eos: 5 %
Hematocrit: 36.3 % (ref 34.0–46.6)
Hemoglobin: 11.8 g/dL (ref 11.1–15.9)
Immature Grans (Abs): 0.2 10*3/uL — ABNORMAL HIGH (ref 0.0–0.1)
Immature Granulocytes: 2 %
Lymphocytes Absolute: 2.1 10*3/uL (ref 0.7–3.1)
Lymphs: 21 %
MCH: 27.8 pg (ref 26.6–33.0)
MCHC: 32.5 g/dL (ref 31.5–35.7)
MCV: 85 fL (ref 79–97)
Monocytes Absolute: 0.8 10*3/uL (ref 0.1–0.9)
Monocytes: 8 %
Neutrophils Absolute: 6.2 10*3/uL (ref 1.4–7.0)
Neutrophils: 63 %
Platelets: 348 10*3/uL (ref 150–450)
RBC: 4.25 x10E6/uL (ref 3.77–5.28)
RDW: 15.8 % — ABNORMAL HIGH (ref 11.7–15.4)
WBC: 9.8 10*3/uL (ref 3.4–10.8)

## 2023-06-06 LAB — ALLERGEN HYMENOPTERA PANEL
Bumblebee: <0.1 kU/L
Honeybee IgE: 1.05 kU/L — AB
Hornet, White Face, IgE: 0.22 kU/L — AB
Hornet, Yellow, IgE: <0.1 kU/L
Paper Wasp IgE: 1.13 kU/L — AB
Yellow Jacket, IgE: 0.32 kU/L — AB

## 2023-06-06 LAB — ENA+DNA/DS+SJORGEN'S
ENA RNP Ab: <0.2 AI (ref 0.0–0.9)
ENA SM Ab Ser-aCnc: <0.2 AI (ref 0.0–0.9)
ENA SSA (RO) Ab: 6.1 AI — ABNORMAL HIGH (ref 0.0–0.9)
ENA SSB (LA) Ab: <0.2 AI (ref 0.0–0.9)
dsDNA Ab: <1 IU/mL (ref 0–9)

## 2023-06-06 LAB — FOOD ALLERGY PROFILE
Allergen Corn, IgE: 0.12 kU/L — AB
Clam IgE: 1.55 kU/L — AB
Codfish IgE: <0.1 kU/L
Egg White IgE: 3.86 kU/L — AB
Milk IgE: 1.09 kU/L — AB
Peanut IgE: 0.44 kU/L — AB
Scallop IgE: 0.52 kU/L — AB
Sesame Seed IgE: 0.35 kU/L — AB
Shrimp IgE: <0.1 kU/L
Soybean IgE: <0.1 kU/L
Walnut IgE: <0.1 kU/L
Wheat IgE: 2.23 kU/L — AB

## 2023-06-06 LAB — ALPHA-GAL PANEL
Allergen Lamb IgE: <0.1 kU/L
Beef IgE: <0.1 kU/L
IgE (Immunoglobulin E), Serum: 167 IU/mL (ref 6–495)
O215-IgE Alpha-Gal: <0.1 kU/L
Pork IgE: 0.44 kU/L — AB

## 2023-06-06 LAB — COMPREHENSIVE METABOLIC PANEL
ALT: 10 IU/L (ref 0–32)
AST: 19 IU/L (ref 0–40)
Albumin/Globulin Ratio: 1.6 (ref 1.2–2.2)
Albumin: 4.1 g/dL (ref 3.8–4.8)
Alkaline Phosphatase: 125 IU/L — ABNORMAL HIGH (ref 44–121)
BUN/Creatinine Ratio: 12 (ref 12–28)
BUN: 13 mg/dL (ref 8–27)
Bilirubin Total: 0.3 mg/dL (ref 0.0–1.2)
CO2: 23 mmol/L (ref 20–29)
Calcium: 9.1 mg/dL (ref 8.7–10.3)
Chloride: 100 mmol/L (ref 96–106)
Creatinine, Ser: 1.05 mg/dL — ABNORMAL HIGH (ref 0.57–1.00)
Globulin, Total: 2.5 g/dL (ref 1.5–4.5)
Glucose: 87 mg/dL (ref 70–99)
Potassium: 4.6 mmol/L (ref 3.5–5.2)
Sodium: 138 mmol/L (ref 134–144)
Total Protein: 6.6 g/dL (ref 6.0–8.5)
eGFR: 55 mL/min/{1.73_m2} — ABNORMAL LOW (ref >59)

## 2023-06-06 LAB — CHRONIC URTICARIA: cu index: <5 (ref <10)

## 2023-06-06 LAB — TRYPTASE: Tryptase: 7.6 ug/L (ref 2.2–13.2)

## 2023-06-06 LAB — THYROID CASCADE PROFILE: TSH: 1.77 u[IU]/mL (ref 0.450–4.500)

## 2023-06-06 LAB — SEDIMENTATION RATE: Sed Rate: 14 mm/hr (ref 0–40)

## 2023-06-06 LAB — C3 AND C4
Complement C3, Serum: 164 mg/dL (ref 82–167)
Complement C4, Serum: 15 mg/dL (ref 12–38)

## 2023-06-06 LAB — C-REACTIVE PROTEIN: CRP: 9 mg/L (ref 0–10)

## 2023-06-06 LAB — ANA W/REFLEX: Anti Nuclear Antibody (ANA): POSITIVE — AB

## 2023-06-06 LAB — ALLERGEN FIRE ANT: I070-IgE Fire Ant (Invicta): <0.1 kU/L

## 2023-06-07 ENCOUNTER — Encounter: Payer: Self-pay | Admitting: Family Medicine

## 2023-06-07 ENCOUNTER — Ambulatory Visit (INDEPENDENT_AMBULATORY_CARE_PROVIDER_SITE_OTHER): Payer: Medicare Other | Admitting: Family Medicine

## 2023-06-07 VITALS — BP 108/68 | HR 88 | Temp 97.9°F | Wt 125.2 lb

## 2023-06-07 DIAGNOSIS — J440 Chronic obstructive pulmonary disease with acute lower respiratory infection: Secondary | ICD-10-CM

## 2023-06-07 DIAGNOSIS — J209 Acute bronchitis, unspecified: Secondary | ICD-10-CM

## 2023-06-07 DIAGNOSIS — R059 Cough, unspecified: Secondary | ICD-10-CM

## 2023-06-07 LAB — POC COVID19 BINAXNOW: SARS Coronavirus 2 Ag: NEGATIVE

## 2023-06-07 LAB — POCT INFLUENZA A/B
Influenza A, POC: NEGATIVE
Influenza B, POC: NEGATIVE

## 2023-06-07 MED ORDER — METHYLPREDNISOLONE SODIUM SUCC 40 MG IJ SOLR
40.0000 mg | Freq: Once | INTRAMUSCULAR | Status: AC
Start: 2023-06-07 — End: 2023-06-07
  Administered 2023-06-07: 40 mg via INTRAMUSCULAR

## 2023-06-07 MED ORDER — PREDNISONE 20 MG PO TABS
40.0000 mg | ORAL_TABLET | Freq: Every day | ORAL | 0 refills | Status: AC
Start: 2023-06-07 — End: 2023-06-12

## 2023-06-07 MED ORDER — IPRATROPIUM-ALBUTEROL 0.5-2.5 (3) MG/3ML IN SOLN
3.0000 mL | Freq: Once | RESPIRATORY_TRACT | Status: AC
Start: 2023-06-07 — End: 2023-06-07
  Administered 2023-06-07: 3 mL via RESPIRATORY_TRACT

## 2023-06-07 NOTE — Progress Notes (Signed)
Assessment/Plan:  Total time spent caring for the patient today was 80 minutes. This includes time spent before the visit reviewing the chart, time spent during the visit, and time spent after the visit on documentation, etc.  Problem List Items Addressed This Visit       Respiratory   Acute bronchitis with COPD (HCC)    Patient experiencing severe exacerbation of COPD with recent worsening in symptoms, including wheezing, shortness of breath, and chest wall pain.  Plan:  Administered DuoNeb (albuterol/ipratropium) nebulizer treatment immediately. Administer methylprednisolone injection, 40 mg IM x1. Prescribe prednisone 40 mg PO daily for 5 days. Continue Flovent 2 puffs twice daily. Use albuterol inhaler every 4-6 hours as needed for wheezing. Monitor oxygen saturation; advised to seek medical attention if SpO2 drops below 92%. Schedule follow-up visit the next day with another physician if primary unavailable. Consider postponing travel to [place] until stabilization is confirmed.      Relevant Medications   predniSONE (DELTASONE) 20 MG tablet   Other Visit Diagnoses     Cough, unspecified type    -  Primary   Relevant Orders   POC COVID-19 BinaxNow (Completed)   POCT Influenza A/B (Completed)       There are no discontinued medications.  Return in 1 day (on 06/08/2023) for wheezing.    Subjective:   Encounter date: 06/07/2023  [name] is a 77 y.o. female who has GENERALIZED ANXIETY DISORDER; Essential hypertension; CAD in native artery; Breast pain; Fibromyalgia; FIBROCYSTIC BREAST DISEASE, HX OF; TOBACCO ABUSE, HX OF; Leg pain; Osteoporosis; Constipation; Nonspecific (abnormal) findings on radiological and other examination of biliary tract; Stricture and stenosis of esophagus; Edema; Acute bronchitis with COPD (HCC); GERD (gastroesophageal reflux disease); Asthma, allergic; Splenic lesion; Malignant neoplasm of lower-outer quadrant of right breast of  female, estrogen receptor positive (HCC); ANA positive; Arthralgia; Headache; Abdominal pain; Atypical chest pain; Chronic night sweats; External hemorrhoids; Pulmonary nodules; Hyperlipidemia; Left hip pain; Sciatica; Knee pain, right; Dyspnea; Allergic rhinitis; Dysphagia; Chronic interstitial cystitis; Bilateral carpal tunnel syndrome; Chronic cough; DDD (degenerative disc disease), cervical; Degenerative scoliosis; Depression; Deviated septum; Family history of malignant hyperthermia; Right arm pain; Memory impairment; Nasal turbinate hypertrophy; Other and combined forms of senile cataract; Peripheral neuropathy; Primary insomnia; Recurrent sinusitis; Hx of colonic polyp; Kidney stone; RLS (restless legs syndrome); Statin intolerance; Recurrent falls; OSA and COPD overlap syndrome (HCC); Low back pain; Vitamin B12 deficiency; Preventative health care; Hyperglycemia; Dysuria; Vocal cord cyst; High serum vitamin D; Sjogren's disease (HCC); Chronic renal insufficiency; Tremor; RUQ pain; Leukocytosis; Hyperkalemia; Anemia; Intertrigo; Facial trauma; Vertigo; Elevated alkaline phosphatase level; Oral herpes; Tongue lesion; Drug reaction; Cellulitis of breast; Common bile duct dilatation; Strep pharyngitis; Weight loss; Multiple drug allergies; Recurrent infections; Rash and other nonspecific skin eruption; Local reaction to hymenoptera sting; Allergic reaction; Other adverse food reactions, not elsewhere classified, subsequent encounter; Moderate persistent asthma without complication; and Other allergic rhinitis on their problem list..   She  has a past medical history of Allergy, Anemia, Anxiety, Arthritis, Asthma, Atrial flutter (HCC), Breast cancer (HCC), CAD (coronary artery disease) (CARDIOLOGIST--  DR Clifton James), Cancer (HCC), Cataract, Chronic constipation, Chronic kidney disease, Concussion, COPD (chronic obstructive pulmonary disease) (HCC), Depression, Dyspnea, Dysrhythmia, Family history of adverse  reaction to anesthesia, Family history of malignant hyperthermia, Fibromyalgia, Frequency of urination, GERD (gastroesophageal reflux disease), H/O hiatal hernia, History of basal cell carcinoma excision, History of breast cancer (ONCOLOGIST-- DR Darnelle Catalan---  NO RECURRANCE), History of chronic bronchitis, History of colonic polyps, History of  tachycardia, Hyperlipidemia, Hypertension, Neuromuscular disorder (HCC), Neuropathy, Osteoporosis (01/2019), Pelvic pain, Personal history of radiation therapy, Pneumonia, S/P radiation therapy (11/12/12 - 12/05/12), Sepsis (HCC) (2014), Sinus headache, Sjogren's disease (HCC), and Urgency of urination..   Chief Complaint: Severe cough and difficulty breathing.  History of Present Illness: Chronic bronchitis: Patient reports frequent episodes of bronchitis exacerbations. Currently, experiencing severe coughing and breathing difficulty, noticed since late Monday night. Symptoms rapidly worsened by early Tuesday morning. Reports significant wheezing this morning, alleviated slightly by using her Flovent inhaler. She irregularly uses Flovent at 2 puffs twice a day, but her condition persists despite usage. Additionally, she has albuterol as a rescue inhaler but has not used it during this episode until advised today.  Recent Illness: Patient had recent strep throat, currently reporting no sore throat but significant right-sided chest discomfort, radiating to her back. Pain worsens with coughing and noted wheezing at the bases upon examination.  Recent Treatment: Started seeing an allergist for allergy testing and management. Allergic to penicillin historically but prednisone use was tolerated previously with Benadryl, now replaced by hydroxyzine as needed for itchiness.   Travel Concerns: Planning to leave for [place] tomorrow morning to meet family; however, concerned about spreading illness or having respiratory issues during the trip.  Review of Systems   Constitutional:  Negative for chills, diaphoresis, fever, malaise/fatigue and weight loss.  HENT:  Negative for congestion, ear discharge, ear pain and hearing loss.   Eyes:  Negative for blurred vision, double vision, photophobia, pain, discharge and redness.  Respiratory:  Positive for cough, sputum production, shortness of breath and wheezing.   Cardiovascular:  Positive for chest pain. Negative for palpitations.  Gastrointestinal:  Negative for abdominal pain, blood in stool, constipation, diarrhea, heartburn, melena, nausea and vomiting.  Genitourinary:  Negative for dysuria, flank pain, frequency, hematuria and urgency.  Musculoskeletal:  Negative for myalgias.  Skin:  Negative for itching and rash.  Neurological:  Negative for dizziness, tingling, tremors, speech change, seizures, loss of consciousness, weakness and headaches.  Psychiatric/Behavioral:  Positive for memory loss. Negative for depression, hallucinations, substance abuse and suicidal ideas. The patient does not have insomnia.   All other systems reviewed and are negative.   Past Surgical History:  Procedure Laterality Date   22 HOUR PH STUDY N/A 04/03/2016   Procedure: 24 HOUR PH STUDY;  Surgeon: Ruffin Frederick, MD;  Location: [facility];  Service: Gastroenterology;  Laterality: N/A;   BREAST BIOPSY Right 08/23/2012   ADH   BREAST BIOPSY Right 10/11/2012   Ductal Carcinoma   BREAST EXCISIONAL BIOPSY Right 09/04/2013   benign   BREAST LUMPECTOMY Right 10/11/2012   W/ [facility] BX   CARDIAC CATHETERIZATION  09-13-2007  DR Riley Kill   WELL-PRESERVED LVF/  DIFFUSE SCATTERED CORONARY CALCIFACATION AND ATHEROSCLEROSIS WITHOUT OBSTRUCTION   CARDIAC CATHETERIZATION  08-04-2010  DR MCALHANY   NON-OBSTRUCTIVE CAD/  pLAD 40%/  oLAD 30%/  mLAD 30%/  pRCA 30%/  EF 60%   CARDIOVASCULAR STRESS TEST  06-18-2012  DR McALHANY   LOW RISK NUCLEAR STUDY/  SMALL FIXED AREA OF MODERATELY DECREASED UPTAKE IN ANTEROSEPTAL WALL WHICH MAY  BE ARTIFACTUAL/  NO ISCHEMIA/  EF 68%   COLONOSCOPY  09/29/2010   CYSTOSCOPY     CYSTOSCOPY WITH HYDRODISTENSION AND BIOPSY N/A 03/06/2014   Procedure: CYSTOSCOPY/HYDRODISTENSION/ INSTILATION OF MARCAINE AND PYRIDIUM;  Surgeon: Kathi Ludwig, MD;  Location: [facility];  Service: Urology;  Laterality: N/A;   CYSTOSCOPY/URETEROSCOPY/HOLMIUM LASER/STENT PLACEMENT Left 03/21/2023   Procedure:  CYSTOSCOPY LEFT RETROGRADE PYELOGRAM, LEFT URETEROSCOPY, HOLMIUM LASER LITHOTRIPSYM, AND LEFT URETERAL STENT PLACEMENT;  Surgeon: Rene Paci, MD;  Location: [facility];  Service: Urology;  Laterality: Left;  60 MINUTES   ESOPHAGEAL MANOMETRY N/A 04/03/2016   Procedure: ESOPHAGEAL MANOMETRY (EM);  Surgeon: Ruffin Frederick, MD;  Location: [facility];  Service: Gastroenterology;  Laterality: N/A;   EXTRACORPOREAL SHOCK WAVE LITHOTRIPSY Left 02/06/2019   Procedure: EXTRACORPOREAL SHOCK WAVE LITHOTRIPSY (ESWL);  Surgeon: Ihor Gully, MD;  Location: [facility];  Service: Urology;  Laterality: Left;   NASAL SINUS SURGERY  1985   ORIF RIGHT ANKLE  FX  2006   POLYPECTOMY     REMOVAL VOCAL CORD CYST  01/2013   RIGHT BREAST BX  08/23/2012   RIGHT HAND SURGERY  X3  LAST ONE 2009   INCLUDES  ORIF RIGHT 5TH FINGER AND REVISION TWICE   SKIN CANCER EXCISION     face,arms   TONSILLECTOMY AND ADENOIDECTOMY  AGE 73   TOTAL ABDOMINAL HYSTERECTOMY W/ BILATERAL SALPINGOOPHORECTOMY  1982   W/  APPENDECTOMY   TRANSTHORACIC ECHOCARDIOGRAM  06/24/2012   GRADE I DIASTOLIC DYSFUNCTION/  EF 55-60%/  MILD MR   UPPER GASTROINTESTINAL ENDOSCOPY      Outpatient Medications Prior to Visit  Medication Sig Dispense Refill   acetaminophen (TYLENOL) 500 MG tablet Take 1,000 mg by mouth every 6 (six) hours as needed for mild pain or headache.      albuterol (VENTOLIN HFA) 108 (90 Base) MCG/ACT inhaler Inhale 2 puffs into the lungs every 4 (four) hours as needed for wheezing or shortness of breath  (coughing fits). 18 g 1   amLODipine (NORVASC) 5 MG tablet Take 1 tablet (5 mg total) by mouth daily. 90 tablet 3   aspirin EC 81 MG tablet Take 1 tablet (81 mg total) by mouth daily. 90 tablet 3   atenolol (TENORMIN) 25 MG tablet Take 1 tablet (25 mg total) by mouth 2 (two) times daily. Take 1 tablet by mouth 2 time a day 180 tablet 3   CALCIUM PO Take 500 mg by mouth daily.     carboxymethylcellul-glycerin (OPTIVE) 0.5-0.9 % ophthalmic solution Place 1 drop into both eyes daily as needed for dry eyes.     Cholecalciferol (VITAMIN D3) 50 MCG (2000 UT) CAPS Take 2,000 Units by mouth daily.     cyanocobalamin (VITAMIN B12) 1000 MCG tablet Take 1,000 mg by mouth daily with breakfast.     diclofenac Sodium (VOLTAREN) 1 % GEL Apply 2 g topically daily as needed (Back pain).     diphenhydrAMINE (BENADRYL) 25 mg capsule Take 25 mg by mouth every 6 (six) hours as needed for itching.     Docusate Sodium (DSS) 100 MG CAPS Take 100 mg by mouth daily as needed (Constipation).     DULoxetine (CYMBALTA) 30 MG capsule Take 1 capsule (30 mg total) by mouth daily. 30 capsule 1   DULoxetine (CYMBALTA) 60 MG capsule Take 1 capsule by mouth twice daily 180 capsule 0   EPINEPHrine 0.3 mg/0.3 mL IJ SOAJ injection Inject 0.3 mg into the muscle as needed for anaphylaxis. (Patient not taking: Reported on 06/08/2023) 2 each 1   famotidine (PEPCID) 20 MG tablet Take 1 tablet (20 mg total) by mouth 2 (two) times daily as needed for heartburn or indigestion. 60 tablet 1   fluticasone (FLONASE) 50 MCG/ACT nasal spray Use 2 spray(s) in each nostril once daily (Patient taking differently: Place 2 sprays into both nostrils daily as needed  for allergies or rhinitis.) 16 g 5   fluticasone (FLOVENT HFA) 110 MCG/ACT inhaler Inhale 2 puffs into the lungs 2 (two) times daily. (Patient taking differently: Inhale 2 puffs into the lungs daily as needed (Short of breath).) 1 each 5   folic acid (FOLVITE) 1 MG tablet Take 1 tablet (1 mg  total) by mouth daily.     furosemide (LASIX) 20 MG tablet Take 1 tablet (20 mg total) by mouth as needed for edema. 90 tablet 3   gabapentin (NEURONTIN) 300 MG capsule Take 300-900 mg by mouth See admin instructions. Take 600 mg in the morning 300 mg at lunch time and 900 mg at bedtime     linaclotide (LINZESS) 145 MCG CAPS capsule Take 1 capsule (145 mcg total) by mouth daily as needed. Lot: [id number]  Exp:11-2023 12 capsule 0   loratadine (CLARITIN) 10 MG tablet Take 10 mg by mouth daily as needed for rhinitis.     Multiple Vitamins-Minerals (MULTIVITAMIN WITH MINERALS) tablet Take 1 tablet by mouth daily. (Patient taking differently: Take 1 tablet by mouth daily. 50+)     nitroGLYCERIN (NITROSTAT) 0.4 MG SL tablet Place 1 tablet (0.4 mg total) under the tongue every 5 (five) minutes as needed for chest pain. (Patient not taking: Reported on 06/08/2023) 25 tablet 1   nystatin cream (MYCOSTATIN) Apply 1 application topically 2 (two) times daily. (Patient taking differently: Apply 1 application  topically daily as needed (vaginal yeast).) 30 g 2   pantoprazole (PROTONIX) 40 MG tablet Take 1 tablet (40 mg total) by mouth 2 (two) times daily. 90 tablet 1   pentosan polysulfate (ELMIRON) 100 MG capsule Take 1 capsule (100 mg total) by mouth 3 (three) times daily. Reported on 01/05/2016 (Patient taking differently: Take 100 mg by mouth daily as needed (burning and pain). Reported on 01/05/2016) 90 capsule 11   polyethylene glycol (MIRALAX) 17 g packet Take 17 g by mouth 2 (two) times daily. 14 each 0   REPATHA SURECLICK 140 MG/ML SOAJ INJECT 140 MG INTO THE SKIN EVERY 14 DAYS 6 mL 0   sucralfate (CARAFATE) 1 GM/10ML suspension Take 10 mLs (1 g total) by mouth 4 (four) times daily -  with meals and at bedtime. (Patient taking differently: Take 1 g by mouth 2 (two) times daily.) 420 mL 1   traMADol (ULTRAM) 50 MG tablet Take 25 mg by mouth 2 (two) times daily.     traZODone (DESYREL) 100 MG tablet TAKE 1 TABLET  BY MOUTH AT BEDTIME 90 tablet 0   Vitamin D, Ergocalciferol, (DRISDOL) 1.25 MG (50000 UNIT) CAPS capsule Take 1 capsule (50,000 Units total) by mouth every 7 (seven) days. 12x Weeks 5 capsule 3   hydroxychloroquine (PLAQUENIL) 200 MG tablet Take 200 mg by mouth daily.     hydrOXYzine (ATARAX) 10 MG tablet Take 1 tablet (10 mg total) by mouth 3 (three) times daily as needed. 30 tablet 1   No facility-administered medications prior to visit.    Family History  Problem Relation Age of Onset   Rectal cancer Mother    Colon cancer Mother    Pancreatic cancer Mother    Diabetes Mother    Breast cancer Mother 72   Breast cancer Maternal Aunt        breast   Birth defects Maternal Aunt    Irritable bowel syndrome Son    Heart disease Son        CAD, MV replacement   Allergic Disorder Daughter  Diabetes Daughter    Colon cancer Father    Colon polyps Father    Diabetes Father    Stroke Father    Heart disease Father        CHF   Hyperlipidemia Father    Hypertension Father    Arthritis Father    Breast cancer Paternal Aunt    Breast cancer Paternal Aunt    Arthritis Paternal Uncle    Allergic Disorder Daughter    Heart disease Cousin        CAD, had a blood clot after stenting   Esophageal cancer Neg Hx    Stomach cancer Neg Hx     Social History   Socioeconomic History   Marital status: Married    Spouse name: Windy Fast    Number of children: 3   Years of education: BA   Highest education level: Bachelor's degree (e.g., BA, AB, BS)  Occupational History   Occupation: Retired Teacher, adult education: RETIRED  Tobacco Use   Smoking status: Former    Packs/day: 1.00    Years: 15.00    Additional pack years: 0.00    Total pack years: 15.00    Types: Cigarettes    Quit date: 05/27/2005    Years since quitting: 18.0   Smokeless tobacco: Never  Vaping Use   Vaping Use: Never used  Substance and Sexual Activity   Alcohol use: No    Alcohol/week: 0.0 standard drinks of alcohol    Drug use: No   Sexual activity: Not Currently    Partners: Male    Birth control/protection: Surgical    Comment: 1st intercourse 77 yo-Fewer than 5 partners, hysterectomy  Other Topics Concern   Not on file  Social History Narrative   Lives with husband.   Caffeine use: 1/2 cup per day   Exercise-- 2days a week [employer],  Water aerobics, walking       Retired Engineer, civil (consulting)   No dietary restrictions, tries to maintain a heart healthy diet   Social Determinants of Health   Financial Resource Strain: Low Risk  (05/01/2023)   Overall Financial Resource Strain (CARDIA)    Difficulty of Paying Living Expenses: Not hard at all  Food Insecurity: No Food Insecurity (05/01/2023)   Hunger Vital Sign    Worried About Running Out of Food in the Last Year: Never true    Ran Out of Food in the Last Year: Never true  Transportation Needs: No Transportation Needs (05/01/2023)   PRAPARE - Administrator, Civil Service (Medical): No    Lack of Transportation (Non-Medical): No  Physical Activity: Insufficiently Active (05/01/2023)   Exercise Vital Sign    Days of Exercise per Week: 1 day    Minutes of Exercise per Session: 30 min  Stress: Stress Concern Present (05/01/2023)   [employer] of Occupational Health - Occupational Stress Questionnaire    Feeling of Stress : To some extent  Social Connections: Unknown (05/01/2023)   Social Connection and Isolation Panel [NHANES]    Frequency of Communication with Friends and Family: More than three times a week    Frequency of Social Gatherings with Friends and Family: Patient declined    Attends Religious Services: Not on Insurance claims handler of Clubs or Organizations: Yes    Attends Banker Meetings: More than 4 times per year    Marital Status: Married  Catering manager Violence: Not At Risk (03/05/2023)   Humiliation, Afraid, Rape, and Kick  questionnaire    Fear of Current or Ex-Partner: No    Emotionally Abused: No    Physically  Abused: No    Sexually Abused: No                                                                                                  Objective:  Physical Exam: BP 108/68 (BP Location: Left Arm, Patient Position: Sitting, Cuff Size: Large)   Pulse 88 Comment: after nebulizer treatment  Temp 97.9 F (36.6 C) (Temporal)   Wt 125 lb 3.2 oz (56.8 kg)   SpO2 94% Comment: after nebulizer treatment  BMI 21.49 kg/m   @LASTSAO2 (3)@  Physical Exam Constitutional:      General: She is not in acute distress.    Appearance: Normal appearance. She is not ill-appearing or toxic-appearing.  HENT:     Head: Normocephalic and atraumatic.     Nose: Nose normal. No congestion.  Eyes:     General: No scleral icterus.    Extraocular Movements: Extraocular movements intact.  Cardiovascular:     Rate and Rhythm: Normal rate and regular rhythm.     Pulses: Normal pulses.     Heart sounds: Normal heart sounds.  Pulmonary:     Effort: Pulmonary effort is normal. No tachypnea or respiratory distress.     Breath sounds: No stridor. Examination of the right-middle field reveals wheezing. Examination of the right-lower field reveals wheezing. Examination of the left-lower field reveals wheezing. Wheezing present.  Abdominal:     General: Abdomen is flat. Bowel sounds are normal.     Palpations: Abdomen is soft.  Musculoskeletal:        General: Normal range of motion.  Lymphadenopathy:     Cervical: No cervical adenopathy.  Skin:    General: Skin is warm and dry.     Findings: No rash.  Neurological:     General: No focal deficit present.     Mental Status: She is alert and oriented to person, place, and time. Mental status is at baseline.  Psychiatric:        Mood and Affect: Mood normal.        Behavior: Behavior normal.        Thought Content: Thought content normal.        Judgment: Judgment normal.     DG Chest 2 View  Result Date: 06/08/2023 CLINICAL DATA:  Acute cough, wheezing, and  shortness of breath. EXAM: CHEST - 2 VIEW COMPARISON:  Chest radiographs 04/27/2023 and 04/09/2023 FINDINGS: Cardiac silhouette mediastinal contours are within normal limits. There is again flattening of the diaphragms and mild-to-moderate hyperinflation. There are again increased lucencies within the bilateral upper lungs with attenuation of the pulmonary vasculature, compatible with chronic emphysematous changes. Moderate biapical pleural scarring is unchanged. No acute airspace opacity. No pleural effusion pneumothorax. Mild multilevel degenerative disc changes of the thoracic spine. Mild levocurvature of the upper lumbar spine. IMPRESSION: 1. No acute cardiopulmonary process. 2. Chronic emphysematous changes and biapical pleural scarring, unchanged from prior. Electronically Signed   By: Neita Garnet M.D.   On: 06/08/2023  12:13   DG Chest 2 View  Result Date: 04/27/2023 CLINICAL DATA:  COVID diagnosed 4 weeks ago, worsening cough and confusion EXAM: CHEST - 2 VIEW COMPARISON:  04/09/2023 FINDINGS: Frontal and lateral views of the chest demonstrate an unremarkable cardiac silhouette. No acute airspace disease, effusion, or pneumothorax. There are no acute bony abnormalities. IMPRESSION: 1. No acute intrathoracic process. Electronically Signed   By: Sharlet Salina M.D.   On: 04/27/2023 18:40   CT Head Wo Contrast  Result Date: 04/27/2023 CLINICAL DATA:  Head trauma, minor. EXAM: CT HEAD WITHOUT CONTRAST TECHNIQUE: Contiguous axial images were obtained from the base of the skull through the vertex without intravenous contrast. RADIATION DOSE REDUCTION: This exam was performed according to the departmental dose-optimization program which includes automated exposure control, adjustment of the mA and/or kV according to patient size and/or use of iterative reconstruction technique. COMPARISON:  Head CT 05/11/2009.  MRI brain 10/10/2017. FINDINGS: Brain: No acute intracranial hemorrhage. Patchy hypoattenuation of  the cerebral white matter, most consistent with mild chronic small-vessel disease. Cortical gray-white differentiation is preserved. No hydrocephalus or extra-axial collection. No mass effect or midline shift. Vascular: No hyperdense vessel or unexpected calcification. Skull: No calvarial fracture or suspicious bone lesion. Skull base is unremarkable. Sinuses/Orbits: Near-complete opacification of the bilateral sphenoid sinuses with air-fluid levels. Orbits are unremarkable. Other: None. IMPRESSION: 1. No acute intracranial abnormality. 2. Mild chronic small-vessel disease. 3. Bilateral sphenoid sinusitis. Electronically Signed   By: Orvan Falconer M.D.   On: 04/27/2023 18:37   DG Chest 2 View  Result Date: 04/09/2023 CLINICAL DATA:  bilateral wheezing, cough, rule out PNA EXAM: CHEST - 2 VIEW COMPARISON:  Chest x-ray 09/21/2022. FINDINGS: Chronic hyperinflation. No consolidation. No visible pleural effusions or pneumothorax. Biapical pleuroparenchymal scarring. Cardiomediastinal silhouette is unchanged. Scoliosis. IMPRESSION: No active cardiopulmonary disease.  COPD. Electronically Signed   By: Feliberto Harts M.D.   On: 04/09/2023 11:16   DG C-Arm 1-60 Min-No Report  Result Date: 03/21/2023 Fluoroscopy was utilized by the requesting physician.  No radiographic interpretation.    Recent Results (from the past 2160 hour(s))  CBC per protocol     Status: Abnormal   Collection Time: 03/20/23 10:53 AM  Result Value Ref Range   WBC 9.3 4.0 - 10.5 K/uL   RBC 4.36 3.87 - 5.11 MIL/uL   Hemoglobin 12.0 12.0 - 15.0 g/dL   HCT 16.1 09.6 - 04.5 %   MCV 90.6 80.0 - 100.0 fL   MCH 27.5 26.0 - 34.0 pg   MCHC 30.4 30.0 - 36.0 g/dL   RDW 40.9 (H) 81.1 - 91.4 %   Platelets 335 150 - 400 K/uL   nRBC 0.0 0.0 - 0.2 %    Comment: Performed at [facility], [address], [place], [place] [address]  Basic metabolic panel per protocol     Status: Abnormal   Collection Time: 03/20/23 10:53  AM  Result Value Ref Range   Sodium 138 135 - 145 mmol/L   Potassium 4.4 3.5 - 5.1 mmol/L   Chloride 101 98 - 111 mmol/L   CO2 28 22 - 32 mmol/L   Glucose, Bld 105 (H) 70 - 99 mg/dL    Comment: Glucose reference range applies only to samples taken after fasting for at least 8 hours.   BUN 12 8 - 23 mg/dL   Creatinine, Ser 6.21 0.44 - 1.00 mg/dL   Calcium 8.9 8.9 - 30.8 mg/dL   GFR, Estimated 59 (L) >60 mL/min  Comment: (NOTE) Calculated using the CKD-EPI Creatinine Equation (2021)    Anion gap 9 5 - 15    Comment: Performed at [facility], [address] [place], [place] [address]  Comp Met (CMET)     Status: Abnormal   Collection Time: 04/09/23 10:33 AM  Result Value Ref Range   Sodium 140 135 - 145 mEq/L   Potassium 3.9 3.5 - 5.1 mEq/L   Chloride 102 96 - 112 mEq/L   CO2 28 19 - 32 mEq/L   Glucose, Bld 107 (H) 70 - 99 mg/dL   BUN 11 6 - 23 mg/dL   Creatinine, Ser 6.04 0.40 - 1.20 mg/dL   Total Bilirubin 0.5 0.2 - 1.2 mg/dL   Alkaline Phosphatase 104 39 - 117 U/L   AST 19 0 - 37 U/L   ALT 9 0 - 35 U/L   Total Protein 6.3 6.0 - 8.3 g/dL   Albumin 3.8 3.5 - 5.2 g/dL   GFR 54.09 (L) >81.19 mL/min    Comment: Calculated using the CKD-EPI Creatinine Equation (2021)   Calcium 8.9 8.4 - 10.5 mg/dL  CBC w/Diff     Status: Abnormal   Collection Time: 04/09/23 10:33 AM  Result Value Ref Range   WBC 5.8 4.0 - 10.5 K/uL   RBC 4.35 3.87 - 5.11 Mil/uL   Hemoglobin 12.2 12.0 - 15.0 g/dL   HCT 14.7 82.9 - 56.2 %   MCV 87.4 78.0 - 100.0 fl   MCHC 32.1 30.0 - 36.0 g/dL   RDW 13.0 (H) 86.5 - 78.4 %   Platelets 369.0 150.0 - 400.0 K/uL   Neutrophils Relative % 69.5 43.0 - 77.0 %   Lymphocytes Relative 19.9 12.0 - 46.0 %   Monocytes Relative 8.2 3.0 - 12.0 %   Eosinophils Relative 1.7 0.0 - 5.0 %   Basophils Relative 0.7 0.0 - 3.0 %   Neutro Abs 4.0 1.4 - 7.7 K/uL   Lymphs Abs 1.1 0.7 - 4.0 K/uL   Monocytes Absolute 0.5 0.1 - 1.0 K/uL   Eosinophils Absolute  0.1 0.0 - 0.7 K/uL   Basophils Absolute 0.0 0.0 - 0.1 K/uL  POC COVID-19     Status: Normal   Collection Time: 04/24/23  9:54 AM  Result Value Ref Range   SARS Coronavirus 2 Ag Negative Negative  POCT rapid strep A     Status: Abnormal   Collection Time: 04/24/23  9:55 AM  Result Value Ref Range   Rapid Strep A Screen Positive (A) Negative  POCT Influenza A/B     Status: Normal   Collection Time: 04/24/23  9:55 AM  Result Value Ref Range   Influenza A, POC     Influenza B, POC Negative Negative  CBG monitoring, ED     Status: None   Collection Time: 04/27/23  6:30 PM  Result Value Ref Range   Glucose-Capillary 74 70 - 99 mg/dL    Comment: Glucose reference range applies only to samples taken after fasting for at least 8 hours.  CBC with Differential     Status: Abnormal   Collection Time: 04/27/23  6:30 PM  Result Value Ref Range   WBC 10.6 (H) 4.0 - 10.5 K/uL   RBC 4.05 3.87 - 5.11 MIL/uL   Hemoglobin 11.1 (L) 12.0 - 15.0 g/dL   HCT 69.6 29.5 - 28.4 %   MCV 89.4 80.0 - 100.0 fL   MCH 27.4 26.0 - 34.0 pg   MCHC 30.7 30.0 -  36.0 g/dL   RDW 16.1 (H) 09.6 - 04.5 %   Platelets 289 150 - 400 K/uL   nRBC 0.0 0.0 - 0.2 %   Neutrophils Relative % 74 %   Neutro Abs 7.9 (H) 1.7 - 7.7 K/uL   Lymphocytes Relative 12 %   Lymphs Abs 1.3 0.7 - 4.0 K/uL   Monocytes Relative 11 %   Monocytes Absolute 1.1 (H) 0.1 - 1.0 K/uL   Eosinophils Relative 1 %   Eosinophils Absolute 0.1 0.0 - 0.5 K/uL   Basophils Relative 1 %   Basophils Absolute 0.1 0.0 - 0.1 K/uL   Immature Granulocytes 1 %   Abs Immature Granulocytes 0.08 (H) 0.00 - 0.07 K/uL    Comment: Performed at [facility], [address] [place], [place] [address]  Comprehensive metabolic panel     Status: Abnormal   Collection Time: 04/27/23  6:30 PM  Result Value Ref Range   Sodium 136 135 - 145 mmol/L   Potassium 3.2 (L) 3.5 - 5.1 mmol/L   Chloride 101 98 - 111 mmol/L   CO2 28 22 - 32 mmol/L   Glucose, Bld 109 (H)  70 - 99 mg/dL    Comment: Glucose reference range applies only to samples taken after fasting for at least 8 hours.   BUN 12 8 - 23 mg/dL   Creatinine, Ser 1.91 (H) 0.44 - 1.00 mg/dL   Calcium 8.5 (L) 8.9 - 10.3 mg/dL   Total Protein 6.3 (L) 6.5 - 8.1 g/dL   Albumin 3.0 (L) 3.5 - 5.0 g/dL   AST 14 (L) 15 - 41 U/L   ALT 8 0 - 44 U/L   Alkaline Phosphatase 100 38 - 126 U/L   Total Bilirubin 0.7 0.3 - 1.2 mg/dL   GFR, Estimated 57 (L) >60 mL/min    Comment: (NOTE) Calculated using the CKD-EPI Creatinine Equation (2021)    Anion gap 7 5 - 15    Comment: Performed at [facility], [address] [place], [place] [address]  Magnesium     Status: None   Collection Time: 04/27/23  6:30 PM  Result Value Ref Range   Magnesium 1.8 1.7 - 2.4 mg/dL    Comment: Performed at [facility], [address] [place], [place] [address]  Urinalysis, Routine w reflex microscopic -Urine, Clean Catch     Status: Abnormal   Collection Time: 04/27/23  6:32 PM  Result Value Ref Range   Color, Urine YELLOW YELLOW   APPearance CLEAR CLEAR   Specific Gravity, Urine 1.015 1.005 - 1.030   pH 7.5 5.0 - 8.0   Glucose, UA NEGATIVE NEGATIVE mg/dL   Hgb urine dipstick NEGATIVE NEGATIVE   Bilirubin Urine SMALL (A) NEGATIVE   Ketones, ur NEGATIVE NEGATIVE mg/dL   Protein, ur 30 (A) NEGATIVE mg/dL   Nitrite NEGATIVE NEGATIVE   Leukocytes,Ua NEGATIVE NEGATIVE    Comment: Performed at [facility], [address] [place], [place] [address]  Urinalysis, Microscopic (reflex)     Status: Abnormal   Collection Time: 04/27/23  6:32 PM  Result Value Ref Range   RBC / HPF 0-5 0 - 5 RBC/hpf   WBC, UA NONE SEEN 0 - 5 WBC/hpf   Bacteria, UA RARE (A) NONE SEEN   Squamous Epithelial / HPF 0-5 0 - 5 /HPF   Ca Oxalate Crys, UA PRESENT    Urine-Other LESS THAN 10 mL OF URINE SUBMITTED  Comment: MICROSCOPIC EXAM PERFORMED ON UNCONCENTRATED URINE Performed at [facility], [address] [place], [place] [address]   CBC w/Diff     Status: Abnormal   Collection Time: 05/01/23 12:00 PM  Result Value Ref Range   WBC 7.8 4.0 - 10.5 K/uL   RBC 4.28 3.87 - 5.11 Mil/uL   Hemoglobin 12.0 12.0 - 15.0 g/dL   HCT 19.1 47.8 - 29.5 %   MCV 87.3 78.0 - 100.0 fl   MCHC 32.2 30.0 - 36.0 g/dL   RDW 62.1 (H) 30.8 - 65.7 %   Platelets 402.0 (H) 150.0 - 400.0 K/uL   Neutrophils Relative % 73.0 43.0 - 77.0 %   Lymphocytes Relative 16.2 12.0 - 46.0 %   Monocytes Relative 7.4 3.0 - 12.0 %   Eosinophils Relative 2.4 0.0 - 5.0 %   Basophils Relative 1.0 0.0 - 3.0 %   Neutro Abs 5.7 1.4 - 7.7 K/uL   Lymphs Abs 1.3 0.7 - 4.0 K/uL   Monocytes Absolute 0.6 0.1 - 1.0 K/uL   Eosinophils Absolute 0.2 0.0 - 0.7 K/uL   Basophils Absolute 0.1 0.0 - 0.1 K/uL  Comp Met (CMET)     Status: Abnormal   Collection Time: 05/01/23 12:00 PM  Result Value Ref Range   Sodium 138 135 - 145 mEq/L   Potassium 4.7 3.5 - 5.1 mEq/L   Chloride 103 96 - 112 mEq/L   CO2 28 19 - 32 mEq/L   Glucose, Bld 84 70 - 99 mg/dL   BUN 13 6 - 23 mg/dL   Creatinine, Ser 8.46 0.40 - 1.20 mg/dL   Total Bilirubin 0.4 0.2 - 1.2 mg/dL   Alkaline Phosphatase 110 39 - 117 U/L   AST 13 0 - 37 U/L   ALT 7 0 - 35 U/L   Total Protein 6.1 6.0 - 8.3 g/dL   Albumin 3.4 (L) 3.5 - 5.2 g/dL   GFR 96.29 (L) >52.84 mL/min    Comment: Calculated using the CKD-EPI Creatinine Equation (2021)   Calcium 8.8 8.4 - 10.5 mg/dL  Magnesium     Status: None   Collection Time: 05/01/23 12:00 PM  Result Value Ref Range   Magnesium 2.0 1.5 - 2.5 mg/dL  TSH     Status: None   Collection Time: 05/01/23 12:00 PM  Result Value Ref Range   TSH 1.46 0.35 - 5.50 uIU/mL  B12 and Folate Panel     Status: Abnormal   Collection Time: 05/01/23 12:00 PM  Result Value Ref Range   Vitamin B-12 497 211 - 911 pg/mL   Folate 4.2 (L) >5.9 ng/mL  RPR     Status: None   Collection Time: 05/01/23 12:00 PM  Result Value Ref Range   RPR Ser Ql  NON-REACTIVE NON-REACTIVE    Comment: . No laboratory evidence of syphilis. If recent exposure is suspected, submit a new sample in 2-4 weeks. .   Urine Culture     Status: None   Collection Time: 05/01/23 12:01 PM   Specimen: Urine  Result Value Ref Range   MICRO NUMBER: [id number]    SPECIMEN QUALITY: Adequate    Sample Source NOT GIVEN    STATUS: FINAL    Result: No Growth   Allergen Hymenoptera Panel     Status: Abnormal   Collection Time: 05/25/23 12:31 PM  Result Value Ref Range   Honeybee IgE 1.05 (A) Class II kU/L   Hornet, White Face, IgE 0.22 (A)  Class 0/I kU/L   Yellow Jacket, IgE 0.32 (A) Class I kU/L   Paper Wasp IgE 1.13 (A) Class II kU/L   Hornet, Yellow, IgE <0.10 Class 0 kU/L   Bumblebee <0.10 Class 0 kU/L  Allergen Fire Ant     Status: None   Collection Time: 05/25/23 12:31 PM  Result Value Ref Range   [id number] Fire Ant (Invicta) <0.10 Class 0 kU/L  Alpha-Gal Panel     Status: Abnormal   Collection Time: 05/25/23 12:31 PM  Result Value Ref Range   Class Description Allergens Comment     Comment:     Levels of Specific IgE       Class  Description of Class     ---------------------------  -----  --------------------                    < 0.10         0         Negative            0.10 -    0.31         0/I       Equivocal/Low            0.32 -    0.55         I         Low            0.56 -    1.40         II        Moderate            1.41 -    3.90         III       High            3.91 -   19.00         IV        Very High           19.01 -  100.00         V         Very High                   >100.00         VI        Very High    IgE (Immunoglobulin E), Serum 167 6 - 495 IU/mL   [id number] Alpha-Gal <0.10 Class 0 kU/L   Beef IgE <0.10 Class 0 kU/L   Pork IgE 0.44 (A) Class I kU/L   Allergen Lamb IgE <0.10 Class 0 kU/L  ANA w/Reflex     Status: Abnormal   Collection Time: 05/25/23 12:31 PM  Result Value Ref Range   Anti Nuclear Antibody (ANA)  Positive (A) Negative  C3 and C4     Status: None   Collection Time: 05/25/23 12:31 PM  Result Value Ref Range   Complement C3, Serum 164 82 - 167 mg/dL   Complement C4, Serum 15 12 - 38 mg/dL  CBC with Differential/Platelet     Status: Abnormal   Collection Time: 05/25/23 12:31 PM  Result Value Ref Range   WBC 9.8 3.4 - 10.8 x10E3/uL   RBC 4.25 3.77 - 5.28 x10E6/uL   Hemoglobin 11.8 11.1 - 15.9 g/dL   Hematocrit 09.8 11.9 - 46.6 %   MCV 85 79 - 97 fL   MCH 27.8 26.6 - 33.0 pg  MCHC 32.5 31.5 - 35.7 g/dL   RDW 08.6 (H) 57.8 - 46.9 %   Platelets 348 150 - 450 x10E3/uL   Neutrophils 63 Not Estab. %   Lymphs 21 Not Estab. %   Monocytes 8 Not Estab. %   Eos 5 Not Estab. %   Basos 1 Not Estab. %   Neutrophils Absolute 6.2 1.4 - 7.0 x10E3/uL   Lymphocytes Absolute 2.1 0.7 - 3.1 x10E3/uL   Monocytes Absolute 0.8 0.1 - 0.9 x10E3/uL   EOS (ABSOLUTE) 0.5 (H) 0.0 - 0.4 x10E3/uL   Basophils Absolute 0.1 0.0 - 0.2 x10E3/uL   Immature Granulocytes 2 Not Estab. %   Immature Grans (Abs) 0.2 (H) 0.0 - 0.1 x10E3/uL    Comment: (An elevated percentage of Immature Granulocytes has not been found to be clinically significant as a sole clinical predictor of disease. Does NOT include bands or blast cells.  Pregnancy associated physiological leukocytosis may also show increased immature granulocytes without clinical significance.)   Chronic Urticaria     Status: None   Collection Time: 05/25/23 12:31 PM  Result Value Ref Range   cu index <5.0 <10    Comment: The CU Index(R) test is the second generation Functional Anti-FceR test.  Patients with a CU Index(R) greater than or equal to 10 have basophil reactive factors in their serum which supports an autoimmune basis for disease. *This test was developed and its performance characteristics determined by [employer]. It has not been cleared or approved by the U.S. Food and Drug Administration.  FLAG Interpretation: A = Abnormal, H = High,  L = Low   Comprehensive metabolic panel     Status: Abnormal   Collection Time: 05/25/23 12:31 PM  Result Value Ref Range   Glucose 87 70 - 99 mg/dL   BUN 13 8 - 27 mg/dL   Creatinine, Ser 6.29 (H) 0.57 - 1.00 mg/dL   eGFR 55 (L) >52 WU/XLK/4.40   BUN/Creatinine Ratio 12 12 - 28   Sodium 138 134 - 144 mmol/L   Potassium 4.6 3.5 - 5.2 mmol/L   Chloride 100 96 - 106 mmol/L   CO2 23 20 - 29 mmol/L   Calcium 9.1 8.7 - 10.3 mg/dL   Total Protein 6.6 6.0 - 8.5 g/dL   Albumin 4.1 3.8 - 4.8 g/dL   Globulin, Total 2.5 1.5 - 4.5 g/dL   Albumin/Globulin Ratio 1.6 1.2 - 2.2   Bilirubin Total 0.3 0.0 - 1.2 mg/dL   Alkaline Phosphatase 125 (H) 44 - 121 IU/L   AST 19 0 - 40 IU/L   ALT 10 0 - 32 IU/L  C-reactive protein     Status: None   Collection Time: 05/25/23 12:31 PM  Result Value Ref Range   CRP 9 0 - 10 mg/L  Thyroid Cascade Profile     Status: None   Collection Time: 05/25/23 12:31 PM  Result Value Ref Range   TSH 1.770 0.450 - 4.500 uIU/mL    Comment: No apparent thyroid disorder. Additional testing not indicated. In rare instances, Secondary Hypothyroidism as well as Subclinical Hypothyroidism have been reported in some patients with normal TSH values.   Tryptase     Status: None   Collection Time: 05/25/23 12:31 PM  Result Value Ref Range   Tryptase 7.6 2.2 - 13.2 ug/L  Sedimentation rate     Status: None   Collection Time: 05/25/23 12:31 PM  Result Value Ref Range   Sed Rate 14 0 - 40 mm/hr  Allergens w/Total IgE Area 2     Status: Abnormal   Collection Time: 05/25/23 12:31 PM  Result Value Ref Range   D Pteronyssinus IgE <0.10 Class 0 kU/L   D Farinae IgE <0.10 Class 0 kU/L   Cat Dander IgE <0.10 Class 0 kU/L   Dog Dander IgE <0.10 Class 0 kU/L   [place] Grass IgE 0.11 (A) Class 0/I kU/L   Timothy Grass IgE 0.27 (A) Class 0/I kU/L   Johnson Grass IgE 0.18 (A) Class 0/I kU/L   Cockroach, German IgE <0.10 Class 0 kU/L   Penicillium Chrysogen IgE <0.10 Class 0 kU/L    Cladosporium Herbarum IgE <0.10 Class 0 kU/L   Aspergillus Fumigatus IgE <0.10 Class 0 kU/L   Alternaria Alternata IgE <0.10 Class 0 kU/L   Maple/Box Elder IgE 0.28 (A) Class 0/I kU/L   Common Silver Charletta Cousin IgE 0.18 (A) Class 0/I kU/L   Cedar, [place] IgE 0.46 (A) Class I kU/L   Oak, White IgE 0.16 (A) Class 0/I kU/L   Elm, American IgE 0.23 (A) Class 0/I kU/L   Cottonwood IgE 0.72 (A) Class II kU/L   Pecan, Hickory IgE 0.20 (A) Class 0/I kU/L   White Mulberry IgE 0.12 (A) Class 0/I kU/L   Ragweed, Short IgE 0.13 (A) Class 0/I kU/L   Pigweed, Rough IgE 0.19 (A) Class 0/I kU/L   Sheep Sorrel IgE Qn <0.10 Class 0 kU/L   Mouse Urine IgE <0.10 Class 0 kU/L  Food Allergy Profile     Status: Abnormal   Collection Time: 05/25/23 12:31 PM  Result Value Ref Range   Egg White IgE 3.86 (A) Class III kU/L   Peanut IgE 0.44 (A) Class I kU/L   Soybean IgE <0.10 Class 0 kU/L   Milk IgE 1.09 (A) Class II kU/L   Clam IgE 1.55 (A) Class III kU/L   Shrimp IgE <0.10 Class 0 kU/L   Walnut IgE <0.10 Class 0 kU/L   Codfish IgE <0.10 Class 0 kU/L   Scallop IgE 0.52 (A) Class I kU/L   Wheat IgE 2.23 (A) Class III kU/L   Allergen Corn, IgE 0.12 (A) Class 0/I kU/L   Sesame Seed IgE 0.35 (A) Class I kU/L  ENA+DNA/DS+Sjorgen's     Status: Abnormal   Collection Time: 05/25/23 12:31 PM  Result Value Ref Range   dsDNA Ab <1 0 - 9 IU/mL    Comment:                                    Negative      <5                                    Equivocal  5 - 9                                    Positive      >9    ENA RNP Ab <0.2 0.0 - 0.9 AI   ENA SM Ab Ser-aCnc <0.2 0.0 - 0.9 AI   ENA SSA (RO) Ab 6.1 (H) 0.0 - 0.9 AI   ENA SSB (LA) Ab <0.2 0.0 - 0.9 AI   See below: Comment     Comment: Autoantibody  Disease Association ------------------------------------------------------------                         Condition                  Frequency ---------------------   ------------------------    --------- Antinuclear Antibody,    SLE, mixed connective Direct (ANA-D)           tissue diseases ---------------------   ------------------------   --------- dsDNA                    SLE                        40 - 60% ---------------------   ------------------------   --------- Chromatin                Drug induced SLE                90%                          SLE                        48 - 97% ---------------------   ------------------------   --------- SSA (Ro)                 SLE                        25 - 35%                          Sjogren's Syndrome         40 - 70%                          Neonatal Lupus                 100% ---------------------   ------------------------   --------- SSB (La)                 SLE                              10%                          Sjogren's Syndrome              30% ---------------------   -----------------------    --------- Sm (anti-Smith)          SLE                        15 - 30% ---------------------   -----------------------    --------- RNP                      Mixed Connective Tissue                          Disease                         95% (U1 nRNP,  SLE                        30 - 50% anti-ribonucleoprotein)  Polymyositis and/or                          Dermatomyositis                 20% ---------------------   ------------------------   --------- Scl-70 (antiDNA          Scleroderma (diffuse)      20 - 35% topoisomerase)           Crest                           13% ---------------------   ------------------------   --------- Jo-1                     Polymyositis and/or                          Dermatomyositis            20 - 40% ---------------------   ------------------------   --------- Centromere B             Scleroderma -  Crest                          variant                         80%   HgB A1c     Status: None   Collection Time: 05/28/23 10:50 AM  Result Value Ref Range   Hgb A1c MFr  Bld 5.5 4.6 - 6.5 %    Comment: Glycemic Control Guidelines for People with Diabetes:Non Diabetic:  <6%Goal of Therapy: <7%Additional Action Suggested:  >8%   CBC w/Diff     Status: Abnormal   Collection Time: 05/28/23 10:50 AM  Result Value Ref Range   WBC 8.2 4.0 - 10.5 K/uL   RBC 4.10 3.87 - 5.11 Mil/uL   Hemoglobin 11.3 (L) 12.0 - 15.0 g/dL   HCT 16.1 09.6 - 04.5 %   MCV 88.4 78.0 - 100.0 fl   MCHC 31.1 30.0 - 36.0 g/dL   RDW 40.9 (H) 81.1 - 91.4 %   Platelets 301.0 150.0 - 400.0 K/uL   Neutrophils Relative % 64.2 43.0 - 77.0 %   Lymphocytes Relative 19.6 12.0 - 46.0 %   Monocytes Relative 7.7 3.0 - 12.0 %   Eosinophils Relative 7.5 (H) 0.0 - 5.0 %   Basophils Relative 1.0 0.0 - 3.0 %   Neutro Abs 5.3 1.4 - 7.7 K/uL   Lymphs Abs 1.6 0.7 - 4.0 K/uL   Monocytes Absolute 0.6 0.1 - 1.0 K/uL   Eosinophils Absolute 0.6 0.0 - 0.7 K/uL   Basophils Absolute 0.1 0.0 - 0.1 K/uL  VITAMIN D 25 Hydroxy (Vit-D Deficiency, Fractures)     Status: None   Collection Time: 05/28/23 10:50 AM  Result Value Ref Range   VITD 40.59 30.00 - 100.00 ng/mL  Comp Met (CMET)     Status: Abnormal   Collection Time: 05/28/23 10:50 AM  Result Value Ref Range   Sodium 142 135 - 145 mEq/L   Potassium 5.3 No hemolysis seen (H) 3.5 - 5.1 mEq/L  Chloride 104 96 - 112 mEq/L   CO2 30 19 - 32 mEq/L   Glucose, Bld 89 70 - 99 mg/dL   BUN 13 6 - 23 mg/dL   Creatinine, Ser 9.60 0.40 - 1.20 mg/dL   Total Bilirubin 0.4 0.2 - 1.2 mg/dL   Alkaline Phosphatase 95 39 - 117 U/L   AST 18 0 - 37 U/L   ALT 7 0 - 35 U/L   Total Protein 6.3 6.0 - 8.3 g/dL   Albumin 3.7 3.5 - 5.2 g/dL   GFR 45.40 (L) >98.11 mL/min    Comment: Calculated using the CKD-EPI Creatinine Equation (2021)   Calcium 9.0 8.4 - 10.5 mg/dL  Comprehensive metabolic panel     Status: Abnormal   Collection Time: 05/30/23 12:09 PM  Result Value Ref Range   Sodium 139 135 - 145 mEq/L   Potassium 4.7 3.5 - 5.1 mEq/L   Chloride 102 96 - 112 mEq/L   CO2  25 19 - 32 mEq/L   Glucose, Bld 92 70 - 99 mg/dL   BUN 15 6 - 23 mg/dL   Creatinine, Ser 9.14 0.40 - 1.20 mg/dL   Total Bilirubin 0.4 0.2 - 1.2 mg/dL   Alkaline Phosphatase 105 39 - 117 U/L   AST 17 0 - 37 U/L   ALT 8 0 - 35 U/L   Total Protein 6.7 6.0 - 8.3 g/dL   Albumin 4.0 3.5 - 5.2 g/dL   GFR 78.29 (L) >56.21 mL/min    Comment: Calculated using the CKD-EPI Creatinine Equation (2021)   Calcium 9.3 8.4 - 10.5 mg/dL  POC [id number] BinaxNow     Status: None   Collection Time: 06/07/23 11:15 AM  Result Value Ref Range   SARS Coronavirus 2 Ag Negative Negative  POCT Influenza A/B     Status: None   Collection Time: 06/07/23 11:16 AM  Result Value Ref Range   Influenza A, POC Negative Negative   Influenza B, POC Negative Negative  CBC w/Diff     Status: Abnormal   Collection Time: 06/08/23 11:19 AM  Result Value Ref Range   WBC 12.5 (H) 4.0 - 10.5 K/uL   RBC 3.86 (L) 3.87 - 5.11 Mil/uL   Hemoglobin 10.7 (L) 12.0 - 15.0 g/dL   HCT 46.9 (L) 62.9 - 52.8 %   MCV 87.7 78.0 - 100.0 fl   MCHC 31.6 30.0 - 36.0 g/dL   RDW 41.3 (H) 24.4 - 01.0 %   Platelets 367.0 150.0 - 400.0 K/uL   Neutrophils Relative % 85.4 Repeated and verified X2. (H) 43.0 - 77.0 %   Lymphocytes Relative 7.4 (L) 12.0 - 46.0 %   Monocytes Relative 6.9 3.0 - 12.0 %   Eosinophils Relative 0.1 0.0 - 5.0 %   Basophils Relative 0.2 0.0 - 3.0 %   Neutro Abs 10.7 (H) 1.4 - 7.7 K/uL   Lymphs Abs 0.9 0.7 - 4.0 K/uL   Monocytes Absolute 0.9 0.1 - 1.0 K/uL   Eosinophils Absolute 0.0 0.0 - 0.7 K/uL   Basophils Absolute 0.0 0.0 - 0.1 K/uL        Garner Nash, MD, MS

## 2023-06-07 NOTE — Patient Instructions (Addendum)
Monitor pulse ox hourly over the next 4 hours. If oxygen saturation drops below 92%, seek medical attention. If oxygen saturation drops below 92% after the 4-hour window, use albuterol inhaler 2 puffs and recheck. If O2 saturation returns above 92%, continue monitoring hourly.2 puffs Watch for symptoms of respiratory distress, loss of consciousness, and chest pain. Seek emergency medical attention if these symptoms develop. You may also use albuterol inhaler for wheezing and cough independent of oxygen saturation.  Take oral prednisone: take 2 pills once a day for 5 days. Continue direct observation with the help of your husband during the monitoring period. Postpone any travel plans: Ensure your breathing and oxygen levels stabilize before considering travel; prioritize your health and safety. Monitor for any worsening symptoms such as chest pain, shortness of breath, dizziness, if worsening seek immediatel medical attention or emergency medical care

## 2023-06-08 ENCOUNTER — Ambulatory Visit (INDEPENDENT_AMBULATORY_CARE_PROVIDER_SITE_OTHER): Payer: Medicare Other | Admitting: Family Medicine

## 2023-06-08 ENCOUNTER — Other Ambulatory Visit: Payer: Self-pay | Admitting: Neurology

## 2023-06-08 ENCOUNTER — Encounter: Payer: Self-pay | Admitting: Family Medicine

## 2023-06-08 ENCOUNTER — Ambulatory Visit (HOSPITAL_BASED_OUTPATIENT_CLINIC_OR_DEPARTMENT_OTHER)
Admission: RE | Admit: 2023-06-08 | Discharge: 2023-06-08 | Disposition: A | Discharge status: Home / Self Care | Payer: Medicare Other | Source: Ambulatory Visit | Attending: Family Medicine | Admitting: Family Medicine

## 2023-06-08 ENCOUNTER — Telehealth: Payer: Self-pay

## 2023-06-08 ENCOUNTER — Encounter: Payer: Medicare Other | Admitting: Rehabilitative and Restorative Service Providers"

## 2023-06-08 VITALS — BP 108/69 | HR 84 | Temp 98.3°F | Ht 64.0 in | Wt 126.0 lb

## 2023-06-08 DIAGNOSIS — J439 Emphysema, unspecified: Secondary | ICD-10-CM | POA: Diagnosis not present

## 2023-06-08 DIAGNOSIS — R059 Cough, unspecified: Secondary | ICD-10-CM | POA: Diagnosis not present

## 2023-06-08 DIAGNOSIS — R0602 Shortness of breath: Secondary | ICD-10-CM | POA: Diagnosis not present

## 2023-06-08 DIAGNOSIS — R051 Acute cough: Secondary | ICD-10-CM

## 2023-06-08 DIAGNOSIS — D509 Iron deficiency anemia, unspecified: Secondary | ICD-10-CM

## 2023-06-08 DIAGNOSIS — R062 Wheezing: Secondary | ICD-10-CM | POA: Diagnosis not present

## 2023-06-08 LAB — CBC WITH DIFFERENTIAL/PLATELET
Basophils Absolute: 0 10*3/uL (ref 0.0–0.1)
Basophils Relative: 0.2 % (ref 0.0–3.0)
Eosinophils Absolute: 0 10*3/uL (ref 0.0–0.7)
Eosinophils Relative: 0.1 % (ref 0.0–5.0)
HCT: 33.9 % — ABNORMAL LOW (ref 36.0–46.0)
Hemoglobin: 10.7 g/dL — ABNORMAL LOW (ref 12.0–15.0)
Lymphocytes Relative: 7.4 % — ABNORMAL LOW (ref 12.0–46.0)
Lymphs Abs: 0.9 10*3/uL (ref 0.7–4.0)
MCHC: 31.6 g/dL (ref 30.0–36.0)
MCV: 87.7 fl (ref 78.0–100.0)
Monocytes Absolute: 0.9 10*3/uL (ref 0.1–1.0)
Monocytes Relative: 6.9 % (ref 3.0–12.0)
Neutro Abs: 10.7 10*3/uL — ABNORMAL HIGH (ref 1.4–7.7)
Neutrophils Relative %: 85.4 % — ABNORMAL HIGH (ref 43.0–77.0)
Platelets: 367 10*3/uL (ref 150.0–400.0)
RBC: 3.86 Mil/uL — ABNORMAL LOW (ref 3.87–5.11)
RDW: 17.7 % — ABNORMAL HIGH (ref 11.5–15.5)
WBC: 12.5 10*3/uL — ABNORMAL HIGH (ref 4.0–10.5)

## 2023-06-08 MED ORDER — AZITHROMYCIN 250 MG PO TABS
ORAL_TABLET | ORAL | 0 refills | Status: AC
Start: 2023-06-08 — End: 2023-06-13

## 2023-06-08 NOTE — Telephone Encounter (Signed)
[facility] called stating they would be unable to add b12 , folate and IBC to labs that were drawn today. Pt will need a lab visit

## 2023-06-08 NOTE — Assessment & Plan Note (Signed)
Patient experiencing severe exacerbation of COPD with recent worsening in symptoms, including wheezing, shortness of breath, and chest wall pain.  Plan:  Administered DuoNeb (albuterol/ipratropium) nebulizer treatment immediately. Administer methylprednisolone injection, 40 mg IM x1. Prescribe prednisone 40 mg PO daily for 5 days. Continue Flovent 2 puffs twice daily. Use albuterol inhaler every 4-6 hours as needed for wheezing. Monitor oxygen saturation; advised to seek medical attention if SpO2 drops below 92%. Schedule follow-up visit the next day with another physician if primary unavailable. Consider postponing travel to [place] until stabilization is confirmed.

## 2023-06-08 NOTE — Progress Notes (Signed)
   Acute Office Visit  Subjective:     Patient ID: [name], female    DOB: 07/10/1946, 77 y.o.   MRN: [id number]  Chief Complaint  Patient presents with   Wheezing    Patient is in today for cough, wheezing.    Patient reports she had been feeling fine, but 3 days ago she noticed feeling a little run down. Two days ago she woke up with a terrible cough, dyspnea, and wheezing. Yesterday she saw Dr. Janee Morn at [place] and he gave her neb treatment, steroid injection, and prednisone burst. Patient has continued her usual Flovent and PRN albuterol. States she may feel slightly better and O2 saturation has improved, staying around 93% at home, but she is still coughing and wheezing. She and her husband were hoping to be driving to [facility] today for a week long vacation with their children, but wanted to check in here first. She reports her ribs are sore from coughing so much. She is not getting up any sputum, and has felt feverish. She has not noticed any tachypnea or tachycardia.        All review of systems negative except what is listed in the HPI      Objective:    BP 108/69   Pulse 84   Temp 98.3 F (36.8 C) (Oral)   Ht 5\' 4"  (1.626 m)   Wt 126 lb (57.2 kg)   SpO2 95%   BMI 21.63 kg/m    Physical Exam Vitals reviewed.  Constitutional:      Appearance: Normal appearance.  Cardiovascular:     Rate and Rhythm: Normal rate and regular rhythm.     Pulses: Normal pulses.     Heart sounds: Normal heart sounds.  Pulmonary:     Effort: Pulmonary effort is normal.     Breath sounds: No rhonchi or rales.     Comments: Minimal wheeze Skin:    General: Skin is warm and dry.  Neurological:     Mental Status: She is alert and oriented to person, place, and time.  Psychiatric:        Mood and Affect: Mood normal.        Behavior: Behavior normal.        Thought Content: Thought content normal.        Judgment: Judgment normal.     No results found for any visits on  06/08/23.      Assessment & Plan:   Problem List Items Addressed This Visit   None Visit Diagnoses     Acute cough    -  Primary   Relevant Orders   CBC w/Diff   DG Chest 2 View     Check blood count and chest xray today.  Continue current medications - inhalers and prednisone Keep watching your oxygen levels and go to the ED if consistently below 92% I would ask that you stay in town until at least Monday before making a decision about travel - if not feeling back to baseline, would recommend you not travel at that time.   No orders of the defined types were placed in this encounter.   Return if symptoms worsen or fail to improve.  Clayborne Dana, NP

## 2023-06-08 NOTE — Telephone Encounter (Signed)
Tried to call patient to let her know and phone disconnected. Will try again later.

## 2023-06-08 NOTE — Patient Instructions (Addendum)
Check blood count and chest xray today.  Continue current medications - inhalers and prednisone Keep watching your oxygen levels and go to the ED if consistently below 92% I would ask that you stay in town until at least Monday before making a decision about travel - if not feeling back to baseline, would recommend you not travel at that time.

## 2023-06-11 NOTE — Telephone Encounter (Signed)
Lab appt made. Patient will come back to have this done.

## 2023-06-12 ENCOUNTER — Other Ambulatory Visit (INDEPENDENT_AMBULATORY_CARE_PROVIDER_SITE_OTHER): Payer: Medicare Other

## 2023-06-12 DIAGNOSIS — E785 Hyperlipidemia, unspecified: Secondary | ICD-10-CM | POA: Diagnosis not present

## 2023-06-12 DIAGNOSIS — D509 Iron deficiency anemia, unspecified: Secondary | ICD-10-CM

## 2023-06-12 DIAGNOSIS — I10 Essential (primary) hypertension: Secondary | ICD-10-CM

## 2023-06-12 LAB — IBC PANEL
Iron: 52 ug/dL (ref 42–145)
Saturation Ratios: 16.2 % — ABNORMAL LOW (ref 20.0–50.0)
TIBC: 320.6 ug/dL (ref 250.0–450.0)
Transferrin: 229 mg/dL (ref 212.0–360.0)

## 2023-06-12 LAB — CBC
HCT: 38 % (ref 36.0–46.0)
Hemoglobin: 11.9 g/dL — ABNORMAL LOW (ref 12.0–15.0)
MCHC: 31.4 g/dL (ref 30.0–36.0)
MCV: 86.7 fl (ref 78.0–100.0)
Platelets: 465 10*3/uL — ABNORMAL HIGH (ref 150.0–400.0)
RBC: 4.39 Mil/uL (ref 3.87–5.11)
RDW: 17.8 % — ABNORMAL HIGH (ref 11.5–15.5)
WBC: 15.4 10*3/uL — ABNORMAL HIGH (ref 4.0–10.5)

## 2023-06-12 LAB — LDL CHOLESTEROL, DIRECT: Direct LDL: 74 mg/dL

## 2023-06-12 LAB — LIPID PANEL
Cholesterol: 147 mg/dL (ref 0–200)
HDL: 48.7 mg/dL (ref >39.00)
NonHDL: 98.49
Total CHOL/HDL Ratio: 3
Triglycerides: 216 mg/dL — ABNORMAL HIGH (ref 0.0–149.0)
VLDL: 43.2 mg/dL — ABNORMAL HIGH (ref 0.0–40.0)

## 2023-06-12 LAB — B12 AND FOLATE PANEL
Folate: 6.3 ng/mL (ref >5.9)
Vitamin B-12: 423 pg/mL (ref 211–911)

## 2023-06-13 ENCOUNTER — Encounter: Payer: Medicare Other | Admitting: Rehabilitative and Restorative Service Providers"

## 2023-06-13 ENCOUNTER — Other Ambulatory Visit: Payer: Self-pay

## 2023-06-13 DIAGNOSIS — I10 Essential (primary) hypertension: Secondary | ICD-10-CM

## 2023-06-13 DIAGNOSIS — E785 Hyperlipidemia, unspecified: Secondary | ICD-10-CM

## 2023-06-14 ENCOUNTER — Other Ambulatory Visit: Payer: Self-pay

## 2023-06-14 ENCOUNTER — Emergency Department (HOSPITAL_BASED_OUTPATIENT_CLINIC_OR_DEPARTMENT_OTHER)
Admission: EM | Admit: 2023-06-14 | Discharge: 2023-06-14 | Disposition: A | Discharge status: Home / Self Care | Payer: Medicare Other | Attending: Emergency Medicine | Admitting: Emergency Medicine

## 2023-06-14 ENCOUNTER — Emergency Department (HOSPITAL_BASED_OUTPATIENT_CLINIC_OR_DEPARTMENT_OTHER): Payer: Medicare Other

## 2023-06-14 ENCOUNTER — Telehealth: Payer: Self-pay | Admitting: Family Medicine

## 2023-06-14 ENCOUNTER — Encounter (HOSPITAL_BASED_OUTPATIENT_CLINIC_OR_DEPARTMENT_OTHER): Payer: Self-pay

## 2023-06-14 DIAGNOSIS — I129 Hypertensive chronic kidney disease with stage 1 through stage 4 chronic kidney disease, or unspecified chronic kidney disease: Secondary | ICD-10-CM | POA: Insufficient documentation

## 2023-06-14 DIAGNOSIS — R0789 Other chest pain: Secondary | ICD-10-CM | POA: Diagnosis not present

## 2023-06-14 DIAGNOSIS — J9801 Acute bronchospasm: Secondary | ICD-10-CM | POA: Insufficient documentation

## 2023-06-14 DIAGNOSIS — I2510 Atherosclerotic heart disease of native coronary artery without angina pectoris: Secondary | ICD-10-CM | POA: Diagnosis not present

## 2023-06-14 DIAGNOSIS — Z7982 Long term (current) use of aspirin: Secondary | ICD-10-CM | POA: Insufficient documentation

## 2023-06-14 DIAGNOSIS — J4521 Mild intermittent asthma with (acute) exacerbation: Secondary | ICD-10-CM | POA: Insufficient documentation

## 2023-06-14 DIAGNOSIS — Z7951 Long term (current) use of inhaled steroids: Secondary | ICD-10-CM | POA: Insufficient documentation

## 2023-06-14 DIAGNOSIS — J189 Pneumonia, unspecified organism: Secondary | ICD-10-CM | POA: Diagnosis not present

## 2023-06-14 DIAGNOSIS — J168 Pneumonia due to other specified infectious organisms: Secondary | ICD-10-CM | POA: Diagnosis not present

## 2023-06-14 DIAGNOSIS — N189 Chronic kidney disease, unspecified: Secondary | ICD-10-CM | POA: Insufficient documentation

## 2023-06-14 DIAGNOSIS — Z91040 Latex allergy status: Secondary | ICD-10-CM | POA: Diagnosis not present

## 2023-06-14 DIAGNOSIS — M79602 Pain in left arm: Secondary | ICD-10-CM | POA: Insufficient documentation

## 2023-06-14 DIAGNOSIS — M25512 Pain in left shoulder: Secondary | ICD-10-CM | POA: Insufficient documentation

## 2023-06-14 DIAGNOSIS — J449 Chronic obstructive pulmonary disease, unspecified: Secondary | ICD-10-CM | POA: Diagnosis not present

## 2023-06-14 DIAGNOSIS — Z20822 Contact with and (suspected) exposure to covid-19: Secondary | ICD-10-CM | POA: Diagnosis not present

## 2023-06-14 DIAGNOSIS — R0602 Shortness of breath: Secondary | ICD-10-CM | POA: Diagnosis not present

## 2023-06-14 DIAGNOSIS — Z853 Personal history of malignant neoplasm of breast: Secondary | ICD-10-CM | POA: Insufficient documentation

## 2023-06-14 LAB — COMPREHENSIVE METABOLIC PANEL
ALT: 13 U/L (ref 0–44)
AST: 17 U/L (ref 15–41)
Albumin: 3.6 g/dL (ref 3.5–5.0)
Alkaline Phosphatase: 102 U/L (ref 38–126)
Anion gap: 10 (ref 5–15)
BUN: 17 mg/dL (ref 8–23)
CO2: 25 mmol/L (ref 22–32)
Calcium: 8.3 mg/dL — ABNORMAL LOW (ref 8.9–10.3)
Chloride: 100 mmol/L (ref 98–111)
Creatinine, Ser: 1.17 mg/dL — ABNORMAL HIGH (ref 0.44–1.00)
GFR, Estimated: 48 mL/min — ABNORMAL LOW (ref >60)
Glucose, Bld: 147 mg/dL — ABNORMAL HIGH (ref 70–99)
Potassium: 3.6 mmol/L (ref 3.5–5.1)
Sodium: 135 mmol/L (ref 135–145)
Total Bilirubin: 0.5 mg/dL (ref 0.3–1.2)
Total Protein: 6.7 g/dL (ref 6.5–8.1)

## 2023-06-14 LAB — CBC WITH DIFFERENTIAL/PLATELET
Abs Immature Granulocytes: 0.23 10*3/uL — ABNORMAL HIGH (ref 0.00–0.07)
Basophils Absolute: 0.1 10*3/uL (ref 0.0–0.1)
Basophils Relative: 0 %
Eosinophils Absolute: 0.3 10*3/uL (ref 0.0–0.5)
Eosinophils Relative: 2 %
HCT: 41.1 % (ref 36.0–46.0)
Hemoglobin: 12.8 g/dL (ref 12.0–15.0)
Immature Granulocytes: 2 %
Lymphocytes Relative: 18 %
Lymphs Abs: 2.8 10*3/uL (ref 0.7–4.0)
MCH: 27.6 pg (ref 26.0–34.0)
MCHC: 31.1 g/dL (ref 30.0–36.0)
MCV: 88.6 fL (ref 80.0–100.0)
Monocytes Absolute: 1.5 10*3/uL — ABNORMAL HIGH (ref 0.1–1.0)
Monocytes Relative: 10 %
Neutro Abs: 10.7 10*3/uL — ABNORMAL HIGH (ref 1.7–7.7)
Neutrophils Relative %: 68 %
Platelets: 437 10*3/uL — ABNORMAL HIGH (ref 150–400)
RBC: 4.64 MIL/uL (ref 3.87–5.11)
RDW: 16.5 % — ABNORMAL HIGH (ref 11.5–15.5)
WBC: 15.6 10*3/uL — ABNORMAL HIGH (ref 4.0–10.5)
nRBC: 0 % (ref 0.0–0.2)

## 2023-06-14 LAB — RESP PANEL BY RT-PCR (RSV, FLU A&B, COVID)  RVPGX2
Influenza A by PCR: NEGATIVE
Influenza B by PCR: NEGATIVE
Resp Syncytial Virus by PCR: NEGATIVE
SARS Coronavirus 2 by RT PCR: NEGATIVE

## 2023-06-14 LAB — D-DIMER, QUANTITATIVE: D-Dimer, Quant: 0.76 ug/mL-FEU — ABNORMAL HIGH (ref 0.00–0.50)

## 2023-06-14 LAB — TROPONIN I (HIGH SENSITIVITY)
Troponin I (High Sensitivity): 4 ng/L (ref <18)
Troponin I (High Sensitivity): 3 ng/L (ref <18)

## 2023-06-14 MED ORDER — MAGNESIUM SULFATE 2 GM/50ML IV SOLN
2.0000 g | Freq: Once | INTRAVENOUS | Status: AC
  Administered 2023-06-14: 2 g via INTRAVENOUS
  Filled 2023-06-14: qty 50

## 2023-06-14 MED ORDER — IPRATROPIUM-ALBUTEROL 0.5-2.5 (3) MG/3ML IN SOLN
3.0000 mL | Freq: Once | RESPIRATORY_TRACT | Status: DC
  Filled 2023-06-14: qty 3

## 2023-06-14 MED ORDER — DEXAMETHASONE SODIUM PHOSPHATE 10 MG/ML IJ SOLN
10.0000 mg | Freq: Once | INTRAMUSCULAR | Status: AC
  Administered 2023-06-14: 10 mg via INTRAVENOUS
  Filled 2023-06-14: qty 1

## 2023-06-14 MED ORDER — IPRATROPIUM-ALBUTEROL 0.5-2.5 (3) MG/3ML IN SOLN
3.0000 mL | Freq: Once | RESPIRATORY_TRACT | Status: AC
  Administered 2023-06-14: 3 mL via RESPIRATORY_TRACT

## 2023-06-14 MED ORDER — FLUTICASONE-SALMETEROL 250-50 MCG/ACT IN AEPB: 1.0000 | INHALATION_SPRAY | Freq: Two times a day (BID) | RESPIRATORY_TRACT | 2 refills | Status: DC

## 2023-06-14 MED ORDER — SODIUM CHLORIDE 0.9 % IV SOLN: INTRAVENOUS | Status: DC | PRN

## 2023-06-14 MED ORDER — IOHEXOL 350 MG/ML SOLN
75.0000 mL | Freq: Once | INTRAVENOUS | Status: AC | PRN
  Administered 2023-06-14: 75 mL via INTRAVENOUS

## 2023-06-14 MED ORDER — SODIUM CHLORIDE 0.9 % IV SOLN
1.0000 g | Freq: Once | INTRAVENOUS | Status: AC
  Administered 2023-06-14: 1 g via INTRAVENOUS
  Filled 2023-06-14: qty 10

## 2023-06-14 NOTE — ED Provider Notes (Signed)
[place] [facility] AT [facility] Provider Note   CSN: [id number] Arrival date & time: 06/14/23  1626     History  Chief Complaint  Patient presents with   Shortness of Breath   Chest Pain    [name] is a 77 y.o. female with an extensive past medical history not including CAD, asthma, history of breast cancer who presents to the emergency department with chief complaint of fatigue and shortness of breath.  She reports that last week she began with a barky cough that progressed into asthma exacerbation.  She had significant pain in her right side with breathing and coughing.  She was seen at her PCPs office treated with a steroid in the office and then a taper along with DuoNeb.  She was notably hypoxic but did not want to come to the ER.  She was discharged home with very close monitoring and her husband checked her oxygen saturations every 4 hours.  She did not drop below 90 and began to feel much better.  She states that she had return of symptoms few days ago and she began taking a Z-Pak.  She comes today because she continues to have coughing and wheezing and has not improved significantly.  She also feels extremely weak and tired.  She is complaining of pain in her left arm and shoulder region.   Shortness of Breath Associated symptoms: chest pain   Chest Pain Associated symptoms: shortness of breath        Home Medications Prior to Admission medications   Medication Sig Start Date End Date Taking? Authorizing Provider  fluticasone-salmeterol (ADVAIR) 250-50 MCG/ACT AEPB Inhale 1 puff into the lungs in the morning and at bedtime. 06/14/23  Yes Braedin Millhouse, PA-C  acetaminophen (TYLENOL) 500 MG tablet Take 1,000 mg by mouth every 6 (six) hours as needed for mild pain or headache.     [provider]  albuterol (VENTOLIN HFA) 108 (90 Base) MCG/ACT inhaler Inhale 2 puffs into the lungs every 4 (four) hours as needed for wheezing or shortness  of breath (coughing fits). 05/08/23   Ellamae Sia, DO  amLODipine (NORVASC) 5 MG tablet Take 1 tablet (5 mg total) by mouth daily. 05/02/23   Lewayne Bunting, MD  aspirin EC 81 MG tablet Take 1 tablet (81 mg total) by mouth daily. 05/28/14   Kathleene Hazel, MD  atenolol (TENORMIN) 25 MG tablet Take 1 tablet (25 mg total) by mouth 2 (two) times daily. Take 1 tablet by mouth 2 time a day 05/02/23   Lewayne Bunting, MD  CALCIUM PO Take 500 mg by mouth daily.    [provider]  carboxymethylcellul-glycerin (OPTIVE) 0.5-0.9 % ophthalmic solution Place 1 drop into both eyes daily as needed for dry eyes.    [provider]  Cholecalciferol (VITAMIN D3) 50 MCG (2000 UT) CAPS Take 2,000 Units by mouth daily.    [provider]  cyanocobalamin (VITAMIN B12) 1000 MCG tablet Take 1,000 mg by mouth daily with breakfast.    [provider]  diclofenac Sodium (VOLTAREN) 1 % GEL Apply 2 g topically daily as needed (Back pain). 02/25/15   [provider]  diphenhydrAMINE (BENADRYL) 25 mg capsule Take 25 mg by mouth every 6 (six) hours as needed for itching.    [provider]  Docusate Sodium (DSS) 100 MG CAPS Take 100 mg by mouth daily as needed (Constipation).    [provider]  DULoxetine (CYMBALTA)  30 MG capsule Take 1 capsule (30 mg total) by mouth daily. 08/15/22   Bradd Canary, MD  DULoxetine (CYMBALTA) 60 MG capsule Take 1 capsule by mouth twice daily 05/29/23   Bradd Canary, MD  EPINEPHrine 0.3 mg/0.3 mL IJ SOAJ injection Inject 0.3 mg into the muscle as needed for anaphylaxis. Patient not taking: Reported on 06/08/2023 05/08/23   Ellamae Sia, DO  famotidine (PEPCID) 20 MG tablet Take 1 tablet (20 mg total) by mouth 2 (two) times daily as needed for heartburn or indigestion. 04/25/23   Bradd Canary, MD  fluticasone (FLONASE) 50 MCG/ACT nasal spray Use 2 spray(s) in each nostril once daily Patient taking differently: Place 2 sprays into  both nostrils daily as needed for allergies or rhinitis. 11/15/20   Bradd Canary, MD  fluticasone (FLOVENT HFA) 110 MCG/ACT inhaler Inhale 2 puffs into the lungs 2 (two) times daily. Patient taking differently: Inhale 2 puffs into the lungs daily as needed (Short of breath). 09/21/22   Parrett, Virgel Bouquet, NP  folic acid (FOLVITE) 1 MG tablet Take 1 tablet (1 mg total) by mouth daily. 05/03/23   Sandford Craze, NP  furosemide (LASIX) 20 MG tablet Take 1 tablet (20 mg total) by mouth as needed for edema. 10/19/21   Lewayne Bunting, MD  gabapentin (NEURONTIN) 300 MG capsule Take 300-900 mg by mouth See admin instructions. Take 600 mg in the morning 300 mg at lunch time and 900 mg at bedtime 09/28/22   Bradd Canary, MD  hydroxychloroquine (PLAQUENIL) 200 MG tablet Take 200 mg by mouth daily. 12/06/21   [provider]  hydrOXYzine (ATARAX) 10 MG tablet Take 1 tablet (10 mg total) by mouth 3 (three) times daily as needed. 04/25/23   Bradd Canary, MD  linaclotide Karlene Einstein) 145 MCG CAPS capsule Take 1 capsule (145 mcg total) by mouth daily as needed. Lot: [id number]  Exp:11-2023 05/22/23   Benancio Deeds, MD  loratadine (CLARITIN) 10 MG tablet Take 10 mg by mouth daily as needed for rhinitis.    [provider]  Multiple Vitamins-Minerals (MULTIVITAMIN WITH MINERALS) tablet Take 1 tablet by mouth daily. Patient taking differently: Take 1 tablet by mouth daily. 50+ 10/29/22   Sandford Craze, NP  nitroGLYCERIN (NITROSTAT) 0.4 MG SL tablet Place 1 tablet (0.4 mg total) under the tongue every 5 (five) minutes as needed for chest pain. Patient not taking: Reported on 06/08/2023 04/11/22   Hyman Hopes B, NP  nystatin cream (MYCOSTATIN) Apply 1 application topically 2 (two) times daily. Patient taking differently: Apply 1 application  topically daily as needed (vaginal yeast). 05/17/21   Bradd Canary, MD  pantoprazole (PROTONIX) 40 MG tablet Take 1 tablet (40 mg total) by mouth 2  (two) times daily. 05/28/23   Bradd Canary, MD  pentosan polysulfate (ELMIRON) 100 MG capsule Take 1 capsule (100 mg total) by mouth 3 (three) times daily. Reported on 01/05/2016 Patient taking differently: Take 100 mg by mouth daily as needed (burning and pain). Reported on 01/05/2016 02/01/17   Ok Edwards, MD  polyethylene glycol (MIRALAX) 17 g packet Take 17 g by mouth 2 (two) times daily. 05/22/23   Benancio Deeds, MD  REPATHA SURECLICK 140 MG/ML SOAJ INJECT 140 MG INTO THE SKIN EVERY 14 DAYS 04/26/23   Bradd Canary, MD  sucralfate (CARAFATE) 1 GM/10ML suspension Take 10 mLs (1 g total) by mouth 4 (four) times daily -  with meals and at bedtime.  Patient taking differently: Take 1 g by mouth 2 (two) times daily. 08/21/22   Donato Schultz, DO  traMADol (ULTRAM) 50 MG tablet Take 25 mg by mouth 2 (two) times daily. 10/12/22   [provider]  traZODone (DESYREL) 100 MG tablet TAKE 1 TABLET BY MOUTH AT BEDTIME 05/30/23   Bradd Canary, MD  Vitamin D, Ergocalciferol, (DRISDOL) 1.25 MG (50000 UNIT) CAPS capsule Take 1 capsule (50,000 Units total) by mouth every 7 (seven) days. 12x Weeks 01/23/23   Bradd Canary, MD      Allergies    Clindamycin hcl, Penicillins, Rosuvastatin, Baclofen, Lincomycin, Prednisone, Codeine, Doxycycline, Erythromycin, Haemophilus influenzae, Haemophilus influenzae vaccines, Latex, Levofloxacin, Other, Pentazocine lactate, Pneumococcal vaccine polyvalent, Tamoxifen, and Fluzone [influenza virus vaccine]    Review of Systems   Review of Systems  Respiratory:  Positive for shortness of breath.   Cardiovascular:  Positive for chest pain.    Physical Exam Updated Vital Signs BP 122/74 (BP Location: Right Arm)   Pulse 71   Temp 98 F (36.7 C) (Oral)   Resp 17   Ht 5' 4.5" (1.638 m)   Wt 54.4 kg   SpO2 94%   BMI 20.28 kg/m  Physical Exam Vitals and nursing note reviewed.  Constitutional:      General: She is not in acute distress.     Appearance: She is well-developed. She is not diaphoretic.  HENT:     Head: Normocephalic and atraumatic.     Right Ear: External ear normal.     Left Ear: External ear normal.     Nose: Nose normal.     Mouth/Throat:     Mouth: Mucous membranes are moist.  Eyes:     General: No scleral icterus.    Conjunctiva/sclera: Conjunctivae normal.  Cardiovascular:     Rate and Rhythm: Normal rate and regular rhythm.     Heart sounds: Normal heart sounds. No murmur heard.    No friction rub. No gallop.  Pulmonary:     Effort: Pulmonary effort is normal. No respiratory distress.     Breath sounds: Examination of the right-upper field reveals wheezing. Examination of the left-upper field reveals wheezing. Examination of the right-middle field reveals wheezing. Examination of the left-middle field reveals wheezing. Examination of the right-lower field reveals wheezing. Examination of the left-lower field reveals wheezing. Wheezing present.     Comments: Speaking in full sentences, no respiratory distress. Prolonged expiratory phase with diffuse expiratory wheezes Abdominal:     General: Bowel sounds are normal. There is no distension.     Palpations: Abdomen is soft. There is no mass.     Tenderness: There is no abdominal tenderness. There is no guarding.  Musculoskeletal:     Cervical back: Normal range of motion.     Right lower leg: No edema.     Left lower leg: No edema.  Skin:    General: Skin is warm and dry.  Neurological:     Mental Status: She is alert and oriented to person, place, and time.  Psychiatric:        Behavior: Behavior normal.     ED Results / Procedures / Treatments   Labs (all labs ordered are listed, but only abnormal results are displayed) Labs Reviewed  CBC WITH DIFFERENTIAL/PLATELET - Abnormal; Notable for the following components:      Result Value   WBC 15.6 (*)    RDW 16.5 (*)    Platelets 437 (*)  Neutro Abs 10.7 (*)    Monocytes Absolute 1.5 (*)     Abs Immature Granulocytes 0.23 (*)    All other components within normal limits  COMPREHENSIVE METABOLIC PANEL - Abnormal; Notable for the following components:   Glucose, Bld 147 (*)    Creatinine, Ser 1.17 (*)    Calcium 8.3 (*)    GFR, Estimated 48 (*)    All other components within normal limits  D-DIMER, QUANTITATIVE - Abnormal; Notable for the following components:   D-Dimer, Quant 0.76 (*)    All other components within normal limits  RESP PANEL BY RT-PCR (RSV, FLU A&B, COVID)  [id number]  TROPONIN I (HIGH SENSITIVITY)  TROPONIN I (HIGH SENSITIVITY)    EKG EKG Interpretation  Date/Time:  Thursday June 14 2023 16:36:02 EDT Ventricular Rate:  85 PR Interval:  112 QRS Duration: 102 QT Interval:  329 QTC Calculation: 392 R Axis:   73 Text Interpretation: Sinus rhythm Borderline short PR interval Repol abnrm, severe global ischemia (LM/MVD) Confirmed by Glyn Ade [phone]) on 06/14/2023 5:24:47 PM  Radiology CT Angio Chest PE W and/or Wo Contrast  Result Date: 06/14/2023 CLINICAL DATA:  Shortness of breath EXAM: CT ANGIOGRAPHY CHEST WITH CONTRAST TECHNIQUE: Multidetector CT imaging of the chest was performed using the standard protocol during bolus administration of intravenous contrast. Multiplanar CT image reconstructions and MIPs were obtained to evaluate the vascular anatomy. RADIATION DOSE REDUCTION: This exam was performed according to the departmental dose-optimization program which includes automated exposure control, adjustment of the mA and/or kV according to patient size and/or use of iterative reconstruction technique. CONTRAST:  75mL OMNIPAQUE IOHEXOL 350 MG/ML SOLN COMPARISON:  Chest x-ray 06/14/2023 chest CT 07/02/2020 FINDINGS: Cardiovascular: Satisfactory opacification of the pulmonary arteries to the segmental level. No evidence of pulmonary embolism. Normal heart size. No pericardial effusion. Mild aortic atherosclerosis. No aneurysm. Coronary vascular  calcification Mediastinum/Nodes: Small hiatal hernia. Midline trachea. No thyroid mass. No suspicious lymph nodes Lungs/Pleura: No consolidation, pleural effusion, or pneumothorax. Mild clustered nodularity in the medial left lung base. Upper Abdomen: No acute abnormality. Musculoskeletal: No chest wall abnormality. No acute or significant osseous findings. Review of the MIP images confirms the above findings. IMPRESSION: 1. Negative for acute pulmonary embolus. 2. Mild clustered nodularity in the medial left lung base, consistent with infectious or inflammatory bronchiolitis. 3. Aortic atherosclerosis. Aortic Atherosclerosis (ICD10-I70.0). Electronically Signed   By: Jasmine Pang M.D.   On: 06/14/2023 18:03   DG Chest 2 View  Result Date: 06/14/2023 CLINICAL DATA:  Chest tightness. EXAM: CHEST - 2 VIEW COMPARISON:  Chest x-ray dated June 08, 2023. FINDINGS: The heart size and mediastinal contours are within normal limits. Normal pulmonary vascularity. The lungs remain hyperinflated with mild biapical pleuroparenchymal scarring. No focal consolidation, pleural effusion, or pneumothorax. No acute osseous abnormality. IMPRESSION: 1. No acute cardiopulmonary disease. COPD. Electronically Signed   By: Obie Dredge M.D.   On: 06/14/2023 16:58    Procedures Procedures    Medications Ordered in ED Medications  ipratropium-albuterol (DUONEB) 0.5-2.5 (3) MG/3ML nebulizer solution 3 mL (3 mLs Nebulization Not Given 06/14/23 1753)  0.9 %  sodium chloride infusion (has no administration in time range)  magnesium sulfate IVPB 2 g 50 mL (0 g Intravenous Stopped 06/14/23 1919)  ipratropium-albuterol (DUONEB) 0.5-2.5 (3) MG/3ML nebulizer solution 3 mL (3 mLs Nebulization Given 06/14/23 1752)  dexamethasone (DECADRON) injection 10 mg (10 mg Intravenous Given 06/14/23 1808)  iohexol (OMNIPAQUE) 350 MG/ML injection 75 mL (75 mLs Intravenous Contrast Given  06/14/23 1740)  cefTRIAXone (ROCEPHIN) 1 g in sodium chloride  0.9 % 100 mL IVPB (0 g Intravenous Stopped 06/14/23 2000)    ED Course/ Medical Decision Making/ A&P Clinical Course as of 06/14/23 2333  Thu Jun 14, 2023  1658 WBC(!): 15.6 [AH]  1724 Stable 76YOF with CP and SOB for 10 days. HX of Asthma was hypoxic with PCP and  [CC]  1807 WBC(!): 15.6 [AH]  1807 D-Dimer, Quant(!): 0.76 [AH]  1807 CT Angio Chest PE W and/or Wo Contrast [AH]    Clinical Course User Index [AH] Arthor Captain, PA-C [CC] Glyn Ade, MD                             Medical Decision Making Amount and/or Complexity of Data Reviewed Labs: ordered. Decision-making details documented in ED Course. Radiology: ordered and independent interpretation performed. Decision-making details documented in ED Course.  Risk Prescription drug management.   This patient presents to the ED for concern of sob, this involves an extensive number of treatment options, and is a complaint that carries with it a high risk of complications and morbidity.  The emergent differential diagnosis for shortness of breath includes, but is not limited to, Pulmonary edema, bronchoconstriction, Pneumonia, Pulmonary embolism, Pneumotherax/ Hemothorax, Dysrythmia, ACS.    Co morbidities:  has a past medical history of Allergy, Anemia, Anxiety, Arthritis, Asthma, Atrial flutter (HCC), Breast cancer (HCC), CAD (coronary artery disease) (CARDIOLOGIST--  DR Clifton James), Cancer (HCC), Cataract, Chronic constipation, Chronic kidney disease, Concussion, COPD (chronic obstructive pulmonary disease) (HCC), Depression, Dyspnea, Dysrhythmia, Family history of adverse reaction to anesthesia, Family history of malignant hyperthermia, Fibromyalgia, Frequency of urination, GERD (gastroesophageal reflux disease), H/O hiatal hernia, History of basal cell carcinoma excision, History of breast cancer (ONCOLOGIST-- DR Darnelle Catalan---  NO RECURRANCE), History of chronic bronchitis, History of colonic polyps, History of tachycardia,  Hyperlipidemia, Hypertension, Neuromuscular disorder (HCC), Neuropathy, Osteoporosis (01/2019), Pelvic pain, Personal history of radiation therapy, Pneumonia, S/P radiation therapy (11/12/12 - 12/05/12), Sepsis (HCC) (2014), Sinus headache, Sjogren's disease (HCC), and Urgency of urination.   Social Determinants of Health:   SDOH Screenings   Food Insecurity: No Food Insecurity (05/01/2023)  Housing: Low Risk  (05/01/2023)  Transportation Needs: No Transportation Needs (05/01/2023)  Utilities: Not At Risk (02/26/2023)  Alcohol Screen: Low Risk  (02/26/2023)  Depression (PHQ2-9): Low Risk  (05/28/2023)  Financial Resource Strain: Low Risk  (05/01/2023)  Physical Activity: Insufficiently Active (05/01/2023)  Social Connections: Unknown (05/01/2023)  Stress: Stress Concern Present (05/01/2023)  Tobacco Use: Medium Risk (06/14/2023)     Additional history:  {Additional history obtained from husband at bedside {External records from outside source obtained and reviewed including   Lab Tests:  I Ordered, and personally interpreted labs.  The pertinent results include:   Per ed course  Imaging Studies:  Per ed course  Cardiac Monitoring/ECG:  The patient was maintained on a cardiac monitor.  I personally viewed and interpreted the cardiac monitored which showed an underlying rhythm of: \\    EKG Interpretation  Date/Time:  Thursday June 14 2023 16:36:02 EDT Ventricular Rate:  85 PR Interval:  112 QRS Duration: 102 QT Interval:  329 QTC Calculation: 392 R Axis:   73 Text Interpretation: Sinus rhythm Borderline short PR interval Repol abnrm, severe global ischemia (LM/MVD) Confirmed by Glyn Ade [phone]) on 06/14/2023 5:24:47 PM        Medicines ordered and prescription drug management:  I ordered medication including  Medications  ipratropium-albuterol (DUONEB) 0.5-2.5 (3) MG/3ML nebulizer solution 3 mL (3 mLs Nebulization Not Given 06/14/23 1753)  0.9 %  sodium chloride infusion  (has no administration in time range)  magnesium sulfate IVPB 2 g 50 mL (0 g Intravenous Stopped 06/14/23 1919)  ipratropium-albuterol (DUONEB) 0.5-2.5 (3) MG/3ML nebulizer solution 3 mL (3 mLs Nebulization Given 06/14/23 1752)  dexamethasone (DECADRON) injection 10 mg (10 mg Intravenous Given 06/14/23 1808)  iohexol (OMNIPAQUE) 350 MG/ML injection 75 mL (75 mLs Intravenous Contrast Given 06/14/23 1740)  cefTRIAXone (ROCEPHIN) 1 g in sodium chloride 0.9 % 100 mL IVPB (0 g Intravenous Stopped 06/14/23 2000)   for sob/ cap Reevaluation of the patient after these medicines showed that the patient improved I have reviewed the patients home medicines and have made adjustments as needed  Test Considered:    Critical Interventions:    Consultations Obtained:   Problem List / ED Course:     ICD-10-CM   1. Community acquired pneumonia, unspecified laterality  J18.9     2. Mild intermittent reactive airway disease with wheezing with acute exacerbation  J45.21     3. Bronchospasm  J98.01       MDM: Patient here with shortness of breath and wheezing.  Appears to have community-acquired pneumonia likely causing bronchospasm and reactive airway.  She is improved with treatment here.  I will discharge with a long-acting beta agonist with steroid.  She may continue with her azithromycin.  She was given Rocephin here for broader coverage of community-acquired pneumonia.  She is feeling improved and has close follow-up with her PCP.  No hypoxia feeling better with ambulation and at rest with breathing and appears appropriate for discharge without evidence of ACS or PE.   Dispostion:  After consideration of the diagnostic results and the patients response to treatment, I feel that the patent would benefit from .         Final Clinical Impression(s) / ED Diagnoses Final diagnoses:  Community acquired pneumonia, unspecified laterality  Mild intermittent reactive airway disease with wheezing  with acute exacerbation  Bronchospasm    Rx / DC Orders ED Discharge Orders          Ordered    fluticasone-salmeterol (ADVAIR) 250-50 MCG/ACT AEPB  2 times daily        06/14/23 1954              Arthor Captain, PA-C 06/14/23 2345    Glyn Ade, MD 06/16/23 1250

## 2023-06-14 NOTE — Telephone Encounter (Signed)
Patient called and stated the latest antibiotic (Avithromycin) that she was placed on has made her feel worst and feels like she is going down hill. She would like a call back.

## 2023-06-14 NOTE — ED Triage Notes (Signed)
Pt reports asthma and acute bronchitis for 10 days, she reports feeling short of breath. She reports chills and checked her temp and it was 92 F. She also reports chest tightness and left arm pain.

## 2023-06-14 NOTE — Discharge Instructions (Addendum)
Please make sure to finish your azithromycin at home as prescribed by your doctor. I added on a long-acting beta agonist and inhaled steroid.  Please make sure to wash out your mouth afterward.   It may be helpful for your doctor to order a nebulizer at home.  Contact a health care provider if: You have a fever. You have trouble sleeping because you cannot control your cough with cough medicine. Get help right away if: Your shortness of breath becomes worse. Your chest pain increases. Your sickness becomes worse, especially if you are an older adult or have a weak immune system. You cough up blood. These symptoms may be an emergency. Get help right away. Call 911. Do not wait to see if the symptoms will go away. Do not drive yourself to the hospital.

## 2023-06-14 NOTE — ED Notes (Signed)
Patient transported to CT 

## 2023-06-15 ENCOUNTER — Encounter: Payer: Medicare Other | Admitting: Rehabilitative and Restorative Service Providers"

## 2023-06-19 ENCOUNTER — Ambulatory Visit (INDEPENDENT_AMBULATORY_CARE_PROVIDER_SITE_OTHER): Payer: Medicare Other | Admitting: Family

## 2023-06-19 ENCOUNTER — Encounter: Payer: Medicare Other | Admitting: Allergy

## 2023-06-19 ENCOUNTER — Encounter: Payer: Self-pay | Admitting: Family

## 2023-06-19 ENCOUNTER — Encounter: Payer: Self-pay | Admitting: Physician Assistant

## 2023-06-19 VITALS — BP 129/66 | HR 74 | Temp 98.4°F | Resp 20 | Wt 123.0 lb

## 2023-06-19 DIAGNOSIS — J219 Acute bronchiolitis, unspecified: Secondary | ICD-10-CM | POA: Diagnosis not present

## 2023-06-19 DIAGNOSIS — R634 Abnormal weight loss: Secondary | ICD-10-CM | POA: Diagnosis not present

## 2023-06-19 NOTE — Assessment & Plan Note (Signed)
Weight appears stable overall since last visit. Recommended that pt continue to monitor her weight.  Of not, she had a CT angio chest and a CT abd/pelvis both in the last 3 months and neither showed any sign of underlying malignancy.  Reassurance provided to patient.

## 2023-06-19 NOTE — Progress Notes (Signed)
Subjective:     Patient ID: [name], female    DOB: February 25, 1946, 77 y.o.   MRN: [id number]  Chief Complaint  Patient presents with   Weight Loss    HPI  Discussed the use of AI scribe software for clinical note transcription with the patient, who gave verbal consent to proceed.  History of Present Illness         She presents today for follow up on her weight loss.     Wt Readings from Last 3 Encounters:  06/19/23 123 lb (55.8 kg)  06/14/23 120 lb (54.4 kg)  06/08/23 126 lb (57.2 kg)   Since last visit she was also treated for COPD exacerbation and Bronchiolitis.  She was treated with Rocephin in the ED on 6/20 and azithromycin. She is still fatigued and feels that she has not gotten all of her strength back.    Health Maintenance Due  Topic Date Due   Diabetic kidney evaluation - Urine ACR  07/10/2014    Past Medical History:  Diagnosis Date   Allergy    Anemia    Anxiety    past hx    Arthritis    Asthma    Atrial flutter (HCC)    past history- not current   Breast cancer (HCC)    CAD (coronary artery disease) CARDIOLOGIST--  DR Clifton James   mild non-obstructive cad   Cancer (HCC)    right   Cataract    bilaterally removed    Chronic constipation    Chronic kidney disease    stage 3 ckd, interstitial cystitis   Concussion    x 3   COPD (chronic obstructive pulmonary disease) (HCC)    Depression    past hx    Dyspnea    Dysrhythmia    Family history of adverse reaction to anesthesia    Family history of malignant hyperthermia    father had this   Fibromyalgia    Frequency of urination    GERD (gastroesophageal reflux disease)    H/O hiatal hernia    History of basal cell carcinoma excision    X2   History of breast cancer ONCOLOGIST-- DR Darnelle Catalan---  NO RECURRANCE   DX 07/2012;  LOW GRADE DCIS  ER+PR+  ----  S/P RIGHT LUMPECTOMY WITH NEGATIVE MARGINS/   RADIATION ENDED 11/2012   History of chronic bronchitis    History of colonic polyps     History of tachycardia    CONTROLLED  WITH ATENOLOL   Hyperlipidemia    Hypertension    Neuromuscular disorder (HCC)    fibromyalgia   Neuropathy    Osteoporosis 01/2019   T score -2.2 stable/improved from prior study   Pelvic pain    Personal history of radiation therapy    Pneumonia    S/P radiation therapy 11/12/12 - 12/05/12   right Breast   Sepsis (HCC) 2014   from UTI    Sinus headache    Sjogren's disease (HCC)    Urgency of urination     Past Surgical History:  Procedure Laterality Date   39 HOUR PH STUDY N/A 04/03/2016   Procedure: 24 HOUR PH STUDY;  Surgeon: Ruffin Frederick, MD;  Location: [facility];  Service: Gastroenterology;  Laterality: N/A;   BREAST BIOPSY Right 08/23/2012   ADH   BREAST BIOPSY Right 10/11/2012   Ductal Carcinoma   BREAST EXCISIONAL BIOPSY Right 09/04/2013   benign   BREAST LUMPECTOMY Right 10/11/2012   W/ SLN  BX   CARDIAC CATHETERIZATION  09-13-2007  DR Riley Kill   WELL-PRESERVED LVF/  DIFFUSE SCATTERED CORONARY CALCIFACATION AND ATHEROSCLEROSIS WITHOUT OBSTRUCTION   CARDIAC CATHETERIZATION  08-04-2010  DR Clifton James   NON-OBSTRUCTIVE CAD/  pLAD 40%/  oLAD 30%/  mLAD 30%/  pRCA 30%/  EF 60%   CARDIOVASCULAR STRESS TEST  06-18-2012  DR McALHANY   LOW RISK NUCLEAR STUDY/  SMALL FIXED AREA OF MODERATELY DECREASED UPTAKE IN ANTEROSEPTAL WALL WHICH MAY BE ARTIFACTUAL/  NO ISCHEMIA/  EF 68%   COLONOSCOPY  09/29/2010   CYSTOSCOPY     CYSTOSCOPY WITH HYDRODISTENSION AND BIOPSY N/A 03/06/2014   Procedure: CYSTOSCOPY/HYDRODISTENSION/ INSTILATION OF MARCAINE AND PYRIDIUM;  Surgeon: Kathi Ludwig, MD;  Location: [facility];  Service: Urology;  Laterality: N/A;   CYSTOSCOPY/URETEROSCOPY/HOLMIUM LASER/STENT PLACEMENT Left 03/21/2023   Procedure: CYSTOSCOPY LEFT RETROGRADE PYELOGRAM, LEFT URETEROSCOPY, HOLMIUM LASER LITHOTRIPSYM, AND LEFT URETERAL STENT PLACEMENT;  Surgeon: Rene Paci, MD;  Location: [facility];   Service: Urology;  Laterality: Left;  60 MINUTES   ESOPHAGEAL MANOMETRY N/A 04/03/2016   Procedure: ESOPHAGEAL MANOMETRY (EM);  Surgeon: Ruffin Frederick, MD;  Location: [facility];  Service: Gastroenterology;  Laterality: N/A;   EXTRACORPOREAL SHOCK WAVE LITHOTRIPSY Left 02/06/2019   Procedure: EXTRACORPOREAL SHOCK WAVE LITHOTRIPSY (ESWL);  Surgeon: Ihor Gully, MD;  Location: [facility];  Service: Urology;  Laterality: Left;   NASAL SINUS SURGERY  1985   ORIF RIGHT ANKLE  FX  2006   POLYPECTOMY     REMOVAL VOCAL CORD CYST  01/2013   RIGHT BREAST BX  08/23/2012   RIGHT HAND SURGERY  X3  LAST ONE 2009   INCLUDES  ORIF RIGHT 5TH FINGER AND REVISION TWICE   SKIN CANCER EXCISION     face,arms   TONSILLECTOMY AND ADENOIDECTOMY  AGE 84   TOTAL ABDOMINAL HYSTERECTOMY W/ BILATERAL SALPINGOOPHORECTOMY  1982   W/  APPENDECTOMY   TRANSTHORACIC ECHOCARDIOGRAM  06/24/2012   GRADE I DIASTOLIC DYSFUNCTION/  EF 55-60%/  MILD MR   UPPER GASTROINTESTINAL ENDOSCOPY      Family History  Problem Relation Age of Onset   Rectal cancer Mother    Colon cancer Mother    Pancreatic cancer Mother    Diabetes Mother    Breast cancer Mother 70   Breast cancer Maternal Aunt        breast   Birth defects Maternal Aunt    Irritable bowel syndrome Son    Heart disease Son        CAD, MV replacement   Allergic Disorder Daughter    Diabetes Daughter    Colon cancer Father    Colon polyps Father    Diabetes Father    Stroke Father    Heart disease Father        CHF   Hyperlipidemia Father    Hypertension Father    Arthritis Father    Breast cancer Paternal Aunt    Breast cancer Paternal Aunt    Arthritis Paternal Uncle    Allergic Disorder Daughter    Heart disease Cousin        CAD, had a blood clot after stenting   Esophageal cancer Neg Hx    Stomach cancer Neg Hx     Social History   Socioeconomic History   Marital status: Married    Spouse name: Windy Fast    Number of children: 3    Years of education: BA   Highest education level: Bachelor's degree (e.g., BA, AB,  BS)  Occupational History   Occupation: Retired Teacher, adult education: RETIRED  Tobacco Use   Smoking status: Former    Packs/day: 1.00    Years: 15.00    Additional pack years: 0.00    Total pack years: 15.00    Types: Cigarettes    Quit date: 05/27/2005    Years since quitting: 18.0   Smokeless tobacco: Never  Vaping Use   Vaping Use: Never used  Substance and Sexual Activity   Alcohol use: No    Alcohol/week: 0.0 standard drinks of alcohol   Drug use: No   Sexual activity: Not Currently    Partners: Male    Birth control/protection: Surgical    Comment: 1st intercourse 77 yo-Fewer than 5 partners, hysterectomy  Other Topics Concern   Not on file  Social History Narrative   Lives with husband.   Caffeine use: 1/2 cup per day   Exercise-- 2days a week [employer],  Water aerobics, walking       Retired Engineer, civil (consulting)   No dietary restrictions, tries to maintain a heart healthy diet   Social Determinants of Health   Financial Resource Strain: Low Risk  (05/01/2023)   Overall Financial Resource Strain (CARDIA)    Difficulty of Paying Living Expenses: Not hard at all  Food Insecurity: No Food Insecurity (05/01/2023)   Hunger Vital Sign    Worried About Running Out of Food in the Last Year: Never true    Ran Out of Food in the Last Year: Never true  Transportation Needs: No Transportation Needs (05/01/2023)   PRAPARE - Administrator, Civil Service (Medical): No    Lack of Transportation (Non-Medical): No  Physical Activity: Insufficiently Active (05/01/2023)   Exercise Vital Sign    Days of Exercise per Week: 1 day    Minutes of Exercise per Session: 30 min  Stress: Stress Concern Present (05/01/2023)   [employer] of Occupational Health - Occupational Stress Questionnaire    Feeling of Stress : To some extent  Social Connections: Unknown (05/01/2023)   Social Connection and Isolation Panel [NHANES]     Frequency of Communication with Friends and Family: More than three times a week    Frequency of Social Gatherings with Friends and Family: Patient declined    Attends Religious Services: Not on Insurance claims handler of Clubs or Organizations: Yes    Attends Banker Meetings: More than 4 times per year    Marital Status: Married  Catering manager Violence: Not At Risk (03/05/2023)   Humiliation, Afraid, Rape, and Kick questionnaire    Fear of Current or Ex-Partner: No    Emotionally Abused: No    Physically Abused: No    Sexually Abused: No    Outpatient Medications Prior to Visit  Medication Sig Dispense Refill   acetaminophen (TYLENOL) 500 MG tablet Take 1,000 mg by mouth every 6 (six) hours as needed for mild pain or headache.      albuterol (VENTOLIN HFA) 108 (90 Base) MCG/ACT inhaler Inhale 2 puffs into the lungs every 4 (four) hours as needed for wheezing or shortness of breath (coughing fits). 18 g 1   amLODipine (NORVASC) 5 MG tablet Take 1 tablet (5 mg total) by mouth daily. 90 tablet 3   aspirin EC 81 MG tablet Take 1 tablet (81 mg total) by mouth daily. 90 tablet 3   atenolol (TENORMIN) 25 MG tablet Take 1 tablet (25 mg total) by mouth 2 (two)  times daily. Take 1 tablet by mouth 2 time a day 180 tablet 3   CALCIUM PO Take 500 mg by mouth daily.     carboxymethylcellul-glycerin (OPTIVE) 0.5-0.9 % ophthalmic solution Place 1 drop into both eyes daily as needed for dry eyes.     Cholecalciferol (VITAMIN D3) 50 MCG (2000 UT) CAPS Take 2,000 Units by mouth daily.     cyanocobalamin (VITAMIN B12) 1000 MCG tablet Take 1,000 mg by mouth daily with breakfast.     diclofenac Sodium (VOLTAREN) 1 % GEL Apply 2 g topically daily as needed (Back pain).     diphenhydrAMINE (BENADRYL) 25 mg capsule Take 25 mg by mouth every 6 (six) hours as needed for itching.     Docusate Sodium (DSS) 100 MG CAPS Take 100 mg by mouth daily as needed (Constipation).     DULoxetine (CYMBALTA) 30  MG capsule Take 1 capsule (30 mg total) by mouth daily. 30 capsule 1   DULoxetine (CYMBALTA) 60 MG capsule Take 1 capsule by mouth twice daily 180 capsule 0   EPINEPHrine 0.3 mg/0.3 mL IJ SOAJ injection Inject 0.3 mg into the muscle as needed for anaphylaxis. 2 each 1   famotidine (PEPCID) 20 MG tablet Take 1 tablet (20 mg total) by mouth 2 (two) times daily as needed for heartburn or indigestion. 60 tablet 1   fluticasone (FLONASE) 50 MCG/ACT nasal spray Use 2 spray(s) in each nostril once daily (Patient taking differently: Place 2 sprays into both nostrils daily as needed for allergies or rhinitis.) 16 g 5   fluticasone (FLOVENT HFA) 110 MCG/ACT inhaler Inhale 2 puffs into the lungs 2 (two) times daily. (Patient taking differently: Inhale 2 puffs into the lungs daily as needed (Short of breath).) 1 each 5   fluticasone-salmeterol (ADVAIR) 250-50 MCG/ACT AEPB Inhale 1 puff into the lungs in the morning and at bedtime. 1 each 2   folic acid (FOLVITE) 1 MG tablet Take 1 tablet (1 mg total) by mouth daily.     furosemide (LASIX) 20 MG tablet Take 1 tablet (20 mg total) by mouth as needed for edema. 90 tablet 3   gabapentin (NEURONTIN) 300 MG capsule Take 300-900 mg by mouth See admin instructions. Take 600 mg in the morning 300 mg at lunch time and 900 mg at bedtime     hydroxychloroquine (PLAQUENIL) 200 MG tablet Take 200 mg by mouth daily.     hydrOXYzine (ATARAX) 10 MG tablet Take 1 tablet (10 mg total) by mouth 3 (three) times daily as needed. 30 tablet 1   linaclotide (LINZESS) 145 MCG CAPS capsule Take 1 capsule (145 mcg total) by mouth daily as needed. Lot: [id number]  Exp:11-2023 12 capsule 0   loratadine (CLARITIN) 10 MG tablet Take 10 mg by mouth daily as needed for rhinitis.     Multiple Vitamins-Minerals (MULTIVITAMIN WITH MINERALS) tablet Take 1 tablet by mouth daily. (Patient taking differently: Take 1 tablet by mouth daily. 50+)     nitroGLYCERIN (NITROSTAT) 0.4 MG SL tablet Place 1 tablet  (0.4 mg total) under the tongue every 5 (five) minutes as needed for chest pain. 25 tablet 1   nystatin cream (MYCOSTATIN) Apply 1 application topically 2 (two) times daily. (Patient taking differently: Apply 1 application  topically daily as needed (vaginal yeast).) 30 g 2   pantoprazole (PROTONIX) 40 MG tablet Take 1 tablet (40 mg total) by mouth 2 (two) times daily. 90 tablet 1   pentosan polysulfate (ELMIRON) 100 MG capsule Take 1 capsule (  100 mg total) by mouth 3 (three) times daily. Reported on 01/05/2016 (Patient taking differently: Take 100 mg by mouth daily as needed (burning and pain). Reported on 01/05/2016) 90 capsule 11   polyethylene glycol (MIRALAX) 17 g packet Take 17 g by mouth 2 (two) times daily. 14 each 0   REPATHA SURECLICK 140 MG/ML SOAJ INJECT 140 MG INTO THE SKIN EVERY 14 DAYS 6 mL 0   sucralfate (CARAFATE) 1 GM/10ML suspension Take 10 mLs (1 g total) by mouth 4 (four) times daily -  with meals and at bedtime. (Patient taking differently: Take 1 g by mouth 2 (two) times daily.) 420 mL 1   traMADol (ULTRAM) 50 MG tablet Take 25 mg by mouth 2 (two) times daily.     traZODone (DESYREL) 100 MG tablet TAKE 1 TABLET BY MOUTH AT BEDTIME 90 tablet 0   Vitamin D, Ergocalciferol, (DRISDOL) 1.25 MG (50000 UNIT) CAPS capsule Take 1 capsule (50,000 Units total) by mouth every 7 (seven) days. 12x Weeks 5 capsule 3   No facility-administered medications prior to visit.    Allergies  Allergen Reactions   Clindamycin Hcl Shortness Of Breath and Rash   Penicillins Anaphylaxis   Rosuvastatin Anaphylaxis   Baclofen Other (See Comments) and Rash   Lincomycin Other (See Comments)   Prednisone Rash    Other reaction(s): Rash, Hives - can tolerate if she takes with benadryl    Codeine Hives and Other (See Comments)    headache Other reaction(s): Headache, Nausea, headache   Doxycycline     Skin rash   Erythromycin Hives   Haemophilus Influenzae Other (See Comments)    Local reaction at  the site Local reaction at the site   Haemophilus Influenzae Vaccines Other (See Comments)    Local reaction at site   Latex Hives   Levofloxacin     Other reaction(s): sick   Other Other (See Comments)    Father had malignant hyperthermia   Pentazocine Lactate Other (See Comments)    HALLUCINATION   Pneumococcal Vaccine Polyvalent Hives, Swelling and Other (See Comments)    REACTION: redness, swelling, and hives at injection site   Tamoxifen Nausea And Vomiting and Other (See Comments)    HEADACHE Other reaction(s): Headache, Nausea   Fluzone [Influenza Virus Vaccine] Hives    Local reaction at the site    ROS  See HPI     Objective:    Physical Exam Constitutional:      General: She is not in acute distress.    Appearance: Normal appearance. She is well-developed.  HENT:     Head: Normocephalic and atraumatic.     Right Ear: External ear normal.     Left Ear: External ear normal.  Eyes:     General: No scleral icterus. Neck:     Thyroid: No thyromegaly.  Cardiovascular:     Rate and Rhythm: Normal rate and regular rhythm.     Heart sounds: Normal heart sounds. No murmur heard. Pulmonary:     Effort: Pulmonary effort is normal. No respiratory distress.     Breath sounds: Normal breath sounds. No wheezing.  Musculoskeletal:     Cervical back: Neck supple.  Skin:    General: Skin is warm and dry.  Neurological:     Mental Status: She is alert and oriented to person, place, and time.  Psychiatric:        Mood and Affect: Mood normal.        Behavior: Behavior normal.  Thought Content: Thought content normal.        Judgment: Judgment normal.      BP 129/66 (BP Location: Left Arm, Patient Position: Sitting, Cuff Size: Normal)   Pulse 74   Temp 98.4 F (36.9 C) (Oral)   Resp 20   Wt 123 lb (55.8 kg)   SpO2 100%   BMI 20.79 kg/m  Wt Readings from Last 3 Encounters:  06/19/23 123 lb (55.8 kg)  06/14/23 120 lb (54.4 kg)  06/08/23 126 lb (57.2 kg)        Assessment & Plan:   Problem List Items Addressed This Visit       Unprioritized   Weight loss - Primary    Weight appears stable overall since last visit. Recommended that pt continue to monitor her weight.  Of not, she had a CT angio chest and a CT abd/pelvis both in the last 3 months and neither showed any sign of underlying malignancy.  Reassurance provided to patient.       Bronchiolitis    She is slowly improving clinically. She will continue to take it easy and let us know if her strength does not continue to improve in the coming weeks.  Lung exam is normal today. Oxygen saturation is 100% on RA.       30 minutes spent on today's visit. Time was spent counseling patient on weight loss and her pulmonary issues.   I am having [name] maintain her acetaminophen, aspirin EC, pentosan polysulfate, fluticasone, cyanocobalamin, nystatin cream, furosemide, hydroxychloroquine, nitroGLYCERIN, DULoxetine, sucralfate, fluticasone, gabapentin, multivitamin with minerals, Vitamin D (Ergocalciferol), diclofenac Sodium, DSS, loratadine, traMADol, Vitamin D3, CALCIUM PO, OPTIVE, diphenhydrAMINE, hydrOXYzine, famotidine, Repatha SureClick, amLODipine, atenolol, folic acid, albuterol, EPINEPHrine, polyethylene glycol, linaclotide, pantoprazole, DULoxetine, traZODone, and fluticasone-salmeterol.  No orders of the defined types were placed in this encounter.

## 2023-06-19 NOTE — Assessment & Plan Note (Signed)
She is slowly improving clinically. She will continue to take it easy and let us know if her strength does not continue to improve in the coming weeks.  Lung exam is normal today. Oxygen saturation is 100% on RA.

## 2023-06-20 ENCOUNTER — Ambulatory Visit
Admission: RE | Admit: 2023-06-20 | Discharge: 2023-06-20 | Disposition: A | Discharge status: Home / Self Care | Payer: Medicare Other | Source: Ambulatory Visit | Attending: Physician Assistant | Admitting: Physician Assistant

## 2023-06-20 ENCOUNTER — Ambulatory Visit: Payer: Medicare Other

## 2023-06-20 DIAGNOSIS — R410 Disorientation, unspecified: Secondary | ICD-10-CM | POA: Diagnosis not present

## 2023-06-20 DIAGNOSIS — H8112 Benign paroxysmal vertigo, left ear: Secondary | ICD-10-CM | POA: Diagnosis not present

## 2023-06-20 DIAGNOSIS — R262 Difficulty in walking, not elsewhere classified: Secondary | ICD-10-CM | POA: Diagnosis not present

## 2023-06-20 DIAGNOSIS — R479 Unspecified speech disturbances: Secondary | ICD-10-CM | POA: Diagnosis not present

## 2023-06-20 DIAGNOSIS — R2681 Unsteadiness on feet: Secondary | ICD-10-CM

## 2023-06-20 DIAGNOSIS — R42 Dizziness and giddiness: Secondary | ICD-10-CM

## 2023-06-20 DIAGNOSIS — M6281 Muscle weakness (generalized): Secondary | ICD-10-CM | POA: Diagnosis not present

## 2023-06-20 NOTE — Progress Notes (Signed)
MRI brain without acute findings. Mild chronic changes in the circulation, likely age related, mild atrophy in the brain, but no stroke, or masses or hemorrhages seen.  Thanks

## 2023-06-20 NOTE — Therapy (Signed)
OUTPATIENT PHYSICAL THERAPY NEURO TREATMENT   Patient Name: [name] MRN: [id number] DOB:1946-05-25, 77 y.o., female Today's Date: 06/20/2023  PCP: Danise Edge, MD REFERRING PROVIDER: Danise Edge, MD END OF SESSION:  PT End of Session - 06/20/23 1008     Visit Number 8    Number of Visits 16    Date for PT Re-Evaluation 07/05/23    Authorization Type [employer] medicare    Progress Note Due on Visit 10    PT Start Time 1015    PT Stop Time 1100    PT Time Calculation (min) 45 min    Activity Tolerance Patient tolerated treatment well    Behavior During Therapy WFL for tasks assessed/performed            Past Medical History:  Diagnosis Date   Allergy    Anemia    Anxiety    past hx    Arthritis    Asthma    Atrial flutter (HCC)    past history- not current   Breast cancer (HCC)    CAD (coronary artery disease) CARDIOLOGIST--  DR Clifton James   mild non-obstructive cad   Cancer (HCC)    right   Cataract    bilaterally removed    Chronic constipation    Chronic kidney disease    stage 3 ckd, interstitial cystitis   Concussion    x 3   COPD (chronic obstructive pulmonary disease) (HCC)    Depression    past hx    Dyspnea    Dysrhythmia    Family history of adverse reaction to anesthesia    Family history of malignant hyperthermia    father had this   Fibromyalgia    Frequency of urination    GERD (gastroesophageal reflux disease)    H/O hiatal hernia    History of basal cell carcinoma excision    X2   History of breast cancer ONCOLOGIST-- DR Darnelle Catalan---  NO RECURRANCE   DX 07/2012;  LOW GRADE DCIS  ER+PR+  ----  S/P RIGHT LUMPECTOMY WITH NEGATIVE MARGINS/   RADIATION ENDED 11/2012   History of chronic bronchitis    History of colonic polyps    History of tachycardia    CONTROLLED  WITH ATENOLOL   Hyperlipidemia    Hypertension    Neuromuscular disorder (HCC)    fibromyalgia   Neuropathy    Osteoporosis 01/2019   T score -2.2 stable/improved from  prior study   Pelvic pain    Personal history of radiation therapy    Pneumonia    S/P radiation therapy 11/12/12 - 12/05/12   right Breast   Sepsis (HCC) 2014   from UTI    Sinus headache    Sjogren's disease (HCC)    Urgency of urination    Past Surgical History:  Procedure Laterality Date   9 HOUR PH STUDY N/A 04/03/2016   Procedure: 24 HOUR PH STUDY;  Surgeon: Ruffin Frederick, MD;  Location: [facility];  Service: Gastroenterology;  Laterality: N/A;   BREAST BIOPSY Right 08/23/2012   ADH   BREAST BIOPSY Right 10/11/2012   Ductal Carcinoma   BREAST EXCISIONAL BIOPSY Right 09/04/2013   benign   BREAST LUMPECTOMY Right 10/11/2012   W/ SLN BX   CARDIAC CATHETERIZATION  09-13-2007  DR Riley Kill   WELL-PRESERVED LVF/  DIFFUSE SCATTERED CORONARY CALCIFACATION AND ATHEROSCLEROSIS WITHOUT OBSTRUCTION   CARDIAC CATHETERIZATION  08-04-2010  DR Clifton James   NON-OBSTRUCTIVE CAD/  pLAD 40%/  oLAD 30%/  mLAD  30%/  pRCA 30%/  EF 60%   CARDIOVASCULAR STRESS TEST  06-18-2012  DR McALHANY   LOW RISK NUCLEAR STUDY/  SMALL FIXED AREA OF MODERATELY DECREASED UPTAKE IN ANTEROSEPTAL WALL WHICH MAY BE ARTIFACTUAL/  NO ISCHEMIA/  EF 68%   COLONOSCOPY  09/29/2010   CYSTOSCOPY     CYSTOSCOPY WITH HYDRODISTENSION AND BIOPSY N/A 03/06/2014   Procedure: CYSTOSCOPY/HYDRODISTENSION/ INSTILATION OF MARCAINE AND PYRIDIUM;  Surgeon: Kathi Ludwig, MD;  Location: [facility];  Service: Urology;  Laterality: N/A;   CYSTOSCOPY/URETEROSCOPY/HOLMIUM LASER/STENT PLACEMENT Left 03/21/2023   Procedure: CYSTOSCOPY LEFT RETROGRADE PYELOGRAM, LEFT URETEROSCOPY, HOLMIUM LASER LITHOTRIPSYM, AND LEFT URETERAL STENT PLACEMENT;  Surgeon: Rene Paci, MD;  Location: [facility];  Service: Urology;  Laterality: Left;  60 MINUTES   ESOPHAGEAL MANOMETRY N/A 04/03/2016   Procedure: ESOPHAGEAL MANOMETRY (EM);  Surgeon: Ruffin Frederick, MD;  Location: [facility];  Service: Gastroenterology;   Laterality: N/A;   EXTRACORPOREAL SHOCK WAVE LITHOTRIPSY Left 02/06/2019   Procedure: EXTRACORPOREAL SHOCK WAVE LITHOTRIPSY (ESWL);  Surgeon: Ihor Gully, MD;  Location: [facility];  Service: Urology;  Laterality: Left;   NASAL SINUS SURGERY  1985   ORIF RIGHT ANKLE  FX  2006   POLYPECTOMY     REMOVAL VOCAL CORD CYST  01/2013   RIGHT BREAST BX  08/23/2012   RIGHT HAND SURGERY  X3  LAST ONE 2009   INCLUDES  ORIF RIGHT 5TH FINGER AND REVISION TWICE   SKIN CANCER EXCISION     face,arms   TONSILLECTOMY AND ADENOIDECTOMY  AGE 89   TOTAL ABDOMINAL HYSTERECTOMY W/ BILATERAL SALPINGOOPHORECTOMY  1982   W/  APPENDECTOMY   TRANSTHORACIC ECHOCARDIOGRAM  06/24/2012   GRADE I DIASTOLIC DYSFUNCTION/  EF 55-60%/  MILD MR   UPPER GASTROINTESTINAL ENDOSCOPY     Patient Active Problem List   Diagnosis Date Noted   Bronchiolitis 06/19/2023   Multiple drug allergies 05/09/2023   Recurrent infections 05/09/2023   Rash and other nonspecific skin eruption 05/09/2023   Local reaction to hymenoptera sting 05/09/2023   Allergic reaction 05/09/2023   Other adverse food reactions, not elsewhere classified, subsequent encounter 05/09/2023   Moderate persistent asthma without complication 05/09/2023   Other allergic rhinitis 05/09/2023   Weight loss 05/01/2023   Strep pharyngitis 04/24/2023   Common bile duct dilatation 02/13/2023   Oral herpes 02/05/2023   Tongue lesion 02/05/2023   Drug reaction 02/05/2023   Cellulitis of breast 02/05/2023   Elevated alkaline phosphatase level 01/25/2023   Facial trauma 01/22/2023   Vertigo 01/22/2023   Intertrigo 12/13/2022   Hyperkalemia 09/28/2022   Anemia 09/28/2022   Tremor 07/05/2022   RUQ pain 07/05/2022   Leukocytosis 07/05/2022   Chronic renal insufficiency 03/21/2022   Sjogren's disease (HCC) 12/12/2021   Vocal cord cyst 05/18/2021   High serum vitamin D 05/18/2021   Dysuria 03/09/2021   Vitamin B12 deficiency 03/08/2021   Preventative health care  03/08/2021   Hyperglycemia 03/08/2021   Low back pain 09/08/2020   OSA and COPD overlap syndrome (HCC) 06/10/2020   Recurrent falls 05/02/2020   Statin intolerance 02/29/2020   RLS (restless legs syndrome) 11/09/2019   Kidney stone 02/26/2019   Recurrent sinusitis 02/25/2019   Hx of colonic polyp 02/25/2019   DDD (degenerative disc disease), cervical 09/17/2018   Degenerative scoliosis 04/22/2018   Primary insomnia 06/25/2017   Chronic interstitial cystitis 02/01/2017   Chronic cough 01/09/2017   Right arm pain 07/20/2016   Deviated septum 05/12/2016   Nasal  turbinate hypertrophy 05/12/2016   Dysphagia    Allergic rhinitis 01/25/2016   Other and combined forms of senile cataract 07/15/2015   Dyspnea    Left hip pain 04/30/2015   Sciatica 04/30/2015   Knee pain, right 04/30/2015   Bilateral carpal tunnel syndrome 03/04/2015   Hyperlipidemia 03/01/2015   Pulmonary nodules 02/12/2015   External hemorrhoids 02/05/2015   Chronic night sweats 01/08/2015   Atypical chest pain 01/01/2015   Memory impairment 05/22/2014   Peripheral neuropathy 05/22/2014   Abdominal pain 10/26/2013   Headache 10/13/2013   Arthralgia 08/18/2013   ANA positive 07/29/2013   Depression 02/05/2013   Family history of malignant hyperthermia 02/05/2013   Malignant neoplasm of lower-outer quadrant of right breast of female, estrogen receptor positive (HCC) 01/03/2013   Splenic lesion 09/02/2012   Asthma, allergic 08/05/2012   Acute bronchitis with COPD (HCC) 06/03/2012   GERD (gastroesophageal reflux disease) 06/03/2012   Edema 04/08/2012   Stricture and stenosis of esophagus 10/19/2011   Constipation 07/21/2011   Nonspecific (abnormal) findings on radiological and other examination of biliary tract 07/21/2011   Osteoporosis 04/17/2011   Leg pain 02/24/2011   FIBROCYSTIC BREAST DISEASE, HX OF 10/18/2010   Essential hypertension 08/25/2010   CAD in native artery 08/25/2010   TOBACCO ABUSE, HX OF  08/25/2010   GENERALIZED ANXIETY DISORDER 08/03/2010   Breast pain 01/11/2010   Fibromyalgia 01/11/2010    ONSET DATE: 04/24/23 REFERRING DIAG:  R42 (ICD-10-CM) - Dizzy spells  R53.1 (ICD-10-CM) - Weakness   THERAPY DIAG:  Dizziness and giddiness  Unsteadiness on feet  Muscle weakness (generalized)  BPPV (benign paroxysmal positional vertigo), left  Rationale for Evaluation and Treatment: Rehabilitation  SUBJECTIVE:                                                                                                                                                                                           SUBJECTIVE STATEMENT: Patient reports she had pneumonia and is still feeling very weak today; states she has the "shakes" and nausea but didn't want to miss therapy because she think it will be good for her. Patient states her dizziness is back and she had an episode where she was bending forward to get something out of the oven and became very dizzy. Patient states she has had "a constant headache for almost 3 weeks". Pt accompanied by: self  PERTINENT HISTORY: chronic back pain, (scheduled for back ablation in June 2024), h/o vertigo PAIN:  Are you having pain? Yes: NPRS scale: notes mild back pain Pain location: low back  Pain description: chronic Aggravating factors: PT will monitor Relieving factors: *has upcoming  intervention scheduled PRECAUTIONS: Fall WEIGHT BEARING RESTRICTIONS: No FALLS: Has patient fallen in last 6 months? Yes. Number of falls 1.  LIVING ENVIRONMENT: Lives with: lives with their spouse Lives in: House/apartment Stairs: One step with one rail PLOF: Independent with basic ADLs-- notes declining mobility.  PATIENT GOALS: "stop the spinning and be able to walk and not falter."  OBJECTIVE:  (Measures in this section from initial evaluation unless otherwise noted) COGNITION: Overall cognitive status:  Patient mentions memory changes since first fall where  she hit her head -- she is unable to recall date SENSATION:  Patient reports numbness and cold sensations in feet. LOWER EXTREMITY MMT:   MMT Right Eval Left Eval  Hip flexion 3+/5 4/5  Knee flexion 4/5 4/5  Knee extension 5/5 5/5  Ankle dorsiflexion 3+/5 3+/5  Ankle plantarflexion    Ankle inversion    Ankle eversion    (Blank rows = not tested) BED MOBILITY:  Sit to supine Complete Independence Supine to sit Complete Independence Rolling to Right Complete Independence Rolling to Left Complete Independence *notes "I'm in a fog" with returning to sitting GAIT: Gait pattern:  slowed pace with SPC and wide BOS Distance walked: 100 ft Assistive device utilized: Single point cane FUNCTIONAL TESTS:  Berg= 40/56  UPDATE: 50/56 ON 06/01/23 5 time sit to stand=20.46 seconds with Ues on legs 70M=0.64 m/sec or 2.10 ft/sec  UPDATE: 3.44 ft/sec ON 06/01/23 PATIENT SURVEYS:  FOTO n/a--not measured  due to vertigo/balance  VESTIBULAR ASSESSMENT: GENERAL OBSERVATION: Gets intermittent dizziness SYMPTOM BEHAVIOR:  Subjective history: Has intermittent h/o vertigo  Non-Vestibular symptoms:  none  Type of dizziness: Spinning/Vertigo, Unsteady with head/body turns, and Lightheadedness/Faint  Frequency: daily   Duration: seconds to minutes  Aggravating factors: Worse in the morning and with getting up after sitting for longer periods  Relieving factors: unsure  Progression of symptoms: unchanged OCULOMOTOR EXAM:  Ocular Alignment: normal  Ocular ROM: No Limitations  Spontaneous Nystagmus: absent  Gaze-Induced Nystagmus: absent  Smooth Pursuits: intact but notes target seems "out of reach"   Saccades: intact VESTIBULAR - OCULAR REFLEX:   Slow VOR: Comment: 2 reps provokes a sensation of bad spinning  Head-Impulse Test:  Did not test today due to inability to tolerate slow VOR  Dynamic Visual Acuity: to be assessed POSITIONAL TESTING: Right Dix-Hallpike: no nystagmus Left Dix-Hallpike: no  nystagmus Right Roll Test: TBA Left Roll Test: TBA Right Sidelying: no nystagmus Left Sidelying: no nystagmus *Notes symptoms with return to sitting.    [facility] Adult PT Treatment:                                                DATE: 06/20/2023 Therapeutic Exercise: Seated: Dynamic side bend reaches (gradually increasing ROM/head movt) Green SB rollout (gradually increasing ROM/spinal flexion) LAQ 2#AW  Bicep curl + overhead press 2#DB STS no HHA x10    [facility] Adult PT Treatment:                                                DATE: 06/01/23 Therapeutic Exercise: Sit<>stand without UE support x 10 reps Neuromuscular re-ed: Berg=50/56 today  Habituation with horizontal head turns in seated with minimal symptoms Habituation standing with horizontal head turns  in standing with minimal dizziness Gait: 3.44 ft/sec With SPC x 100 ft  Self Care: Recommended/discussed beach trip and safety with walking-- using device and having family nearby.    [facility] Adult PT Treatment:                                                DATE: 05/25/23  Therapeutic Exercise: Standing Wall squats with ball between knees x 10 reps Wall squats mini without ball x 10 reps Palloff press with green band with CGA x 10 reps R and L sides Seated Red band with pelvic rocking anterior/posterior tilting Sit<>stand x 5 reps without UE support Supine Legs supported on physioball Lumbar rotation Manual Therapy: Gentle PA mobs lumbar spine and SI Neuromuscular re-ed: Seated Habituation Head rotation x 5 reps Gaze x 1 viewing x 10 reps with small head motion horizontal plane Gaze x 1 viewing x 10 reps vertical plane Standing Marching in place with cues to avoid lumbar posterior tilt x 10 reps without UE support with CGA Sidestepping x 10 feet x 4 reps with cues on core engagement Gait: Ambulation without SPC in clinic x 80 feet x 3 reps mod indep    PATIENT EDUCATION: Education details: HEP Person educated:  Patient Education method: Explanation Education comprehension: verbalized understanding  HOME EXERCISE PROGRAM: Access Code: [email or web address] URL: [email or web address] Date: 06/01/2023 Prepared by: Margretta Ditty  Exercises - Sit to Stand with Armchair  - 2 x daily - 7 x weekly - 1 sets - 5 reps - Standing with Head Rotation  - 2 x daily - 7 x weekly - 1 sets - 10 reps - Seated Gaze Stabilization with Head Rotation  - 2 x daily - 7 x weekly - 1 sets - 10 reps - Seated Single Leg Hip Abduction with Resistance  - 1 x daily - 7 x weekly - 3 sets - 10 reps - Supine Lower Trunk Rotation  - 2 x daily - 7 x weekly - 1 sets - 10 reps  GOALS: Goals reviewed with patient? Yes  SHORT TERM GOALS: Target date: 06/06/23  Patient will be independent with initial HEP.  Baseline: To be given Goal status: MET  2.  Patient will complete Berg, and FGA if indicated. Baseline: 40/56 (Berg) Goal status: MET   3.  Patient will report decreased dizziness by 50%. Baseline: n/a Goal status: MET  LONG TERM GOALS:  Target date: 07/05/23  Patient will be independent with advanced/ongoing HEP to improve outcomes and carryover.  Baseline: to be established. Goal status: INITIAL  2.  Patient will improve Berg balance score by 5 points to demo improved functional balance.  Baseline: 40/56; Berg=50/56  Goal status: MET ON 06/01/23  3. Patient will demo floor<>stand with UE support mod indep for improved functional strength.  Baseline: has h/o falls.  Goal status: INITIAL  4. Patient will improve gait speed to 2.8 ft/sec to demo improving mobility and dec'd risk for falls.  Baseline: 3.44 ft/sec  Goal status: MET ON 06/01/23  ASSESSMENT:  CLINICAL IMPRESSION: Focused on progressing mobility and endurance due to decreased tolerance for physical activity with recovering from pneumonia. Gradual increase in head movements with stretches added to increase habituation tolerance.   OBJECTIVE  IMPAIRMENTS: Abnormal gait, decreased activity tolerance, decreased balance, decreased strength, dizziness, impaired sensation, and pain.   PLAN:  PT FREQUENCY:  2x/week  PT DURATION: 8 weeks  PLANNED INTERVENTIONS: Therapeutic exercises, Therapeutic activity, Neuromuscular re-education, Balance training, Gait training, Patient/Family education, Self Care, Joint mobilization, Vestibular training, Canalith repositioning, Aquatic Therapy, Dry Needling, Electrical stimulation, Moist heat, Taping, and Manual therapy  PLAN FOR NEXT SESSION: Progress gait activities to tolerance, LE strength, balance. Check floor<>stand (does in gardening), work on standing corner balance to progress for home (narrowing BOS with head turns), single leg control, turning towards the right. After beach trip plan to renew with goals of turning, single leg standing, and high level balance tasks.  Sanjuana Mae, PTA 06/20/2023 11:02 AM

## 2023-06-22 ENCOUNTER — Ambulatory Visit: Payer: Medicare Other | Admitting: Rehabilitative and Restorative Service Providers"

## 2023-06-22 DIAGNOSIS — M47896 Other spondylosis, lumbar region: Secondary | ICD-10-CM | POA: Diagnosis not present

## 2023-06-22 DIAGNOSIS — M47816 Spondylosis without myelopathy or radiculopathy, lumbar region: Secondary | ICD-10-CM | POA: Diagnosis not present

## 2023-06-24 ENCOUNTER — Other Ambulatory Visit: Payer: Medicare Other

## 2023-06-25 ENCOUNTER — Ambulatory Visit (INDEPENDENT_AMBULATORY_CARE_PROVIDER_SITE_OTHER): Payer: Medicare Other | Admitting: Behavioral Health

## 2023-06-25 DIAGNOSIS — F331 Major depressive disorder, recurrent, moderate: Secondary | ICD-10-CM | POA: Diagnosis not present

## 2023-06-25 DIAGNOSIS — F411 Generalized anxiety disorder: Secondary | ICD-10-CM | POA: Diagnosis not present

## 2023-06-25 NOTE — Progress Notes (Unsigned)
[facility] Counselor Initial Adult Exam  Name: [name] Date: 06/25/2023 MRN: [id number] DOB: Apr 01, 1946 PCP: Bradd Canary, MD  Time spent: 60 minutes, 11 AM until 12 PM spent in person with the patient.  Guardian/Payee: Self  Paperwork requested: No   Reason for Visit /Presenting Problem: Anxiety, depression  Mental Status Exam: Appearance:   Well Groomed     Behavior:  Appropriate  Motor:  Normal  Speech/Language:   Normal Rate  Affect:  Appropriate  Mood:  normal  Thought process:  normal  Thought content:    WNL  Sensory/Perceptual disturbances:    WNL  Orientation:  oriented to person, place, time/date, situation, day of week, month of year, and year  Attention:  Good  Concentration:  Good  Memory:  WNL  Fund of knowledge:   Good  Insight:    Good  Judgment:   Good  Impulse Control:  Good    Reported Symptoms: Depression, anxiety  Risk Assessment: Danger to Self:  No Self-injurious Behavior: No Danger to Others: No Duty to Warn:no Physical Aggression / Violence:No  Access to Firearms a concern: No  Gang Involvement:No  Patient / guardian was educated about steps to take if suicide or homicide risk level increases between visits: n/a While future psychiatric events cannot be accurately predicted, the patient does not currently require acute inpatient psychiatric care and does not currently meet [facility] involuntary commitment criteria.  Substance Abuse History: Current substance abuse: No     Past Psychiatric History:   No previous psychological problems have been observed Outpatient Providers:*** History of Psych Hospitalization: No  Psychological Testing:  n/a    Abuse History:  Victim of: No.,  None reported    Report needed: No. Victim of Neglect:No. Perpetrator of none reported  Witness / Exposure to Domestic Violence: None reported  Protective Services Involvement: No  Witness to [employer] Violence:  No   Family  History:  Family History  Problem Relation Age of Onset   Rectal cancer Mother    Colon cancer Mother    Pancreatic cancer Mother    Diabetes Mother    Breast cancer Mother 28   Breast cancer Maternal Aunt        breast   Birth defects Maternal Aunt    Irritable bowel syndrome Son    Heart disease Son        CAD, MV replacement   Allergic Disorder Daughter    Diabetes Daughter    Colon cancer Father    Colon polyps Father    Diabetes Father    Stroke Father    Heart disease Father        CHF   Hyperlipidemia Father    Hypertension Father    Arthritis Father    Breast cancer Paternal Aunt    Breast cancer Paternal Aunt    Arthritis Paternal Uncle    Allergic Disorder Daughter    Heart disease Cousin        CAD, had a blood clot after stenting   Esophageal cancer Neg Hx    Stomach cancer Neg Hx     Living situation: the patient lives with their spouse  Sexual Orientation: Straight  Relationship Status: married  Name of spouse / other: [name] If a parent, number of children / ages: The the patient has 3 adult children  Support Systems: spouse friends Children, church  Financial Stress:  No   Income/Employment/Disability: Neurosurgeon: No  Educational History: Education: high school diploma/GED  Religion/Sprituality/World View: Protestant  Any cultural differences that may affect / interfere with treatment:  not applicable   Recreation/Hobbies: Time with husband  Stressors: Health problems    Strengths: Supportive Relationships, Church, Spirituality, Journalist, newspaper, and Able to Communicate Effectively  Barriers:     Legal History: Pending legal issue / charges: The patient has no significant history of legal issues. History of legal issue / charges:  None reported  Medical History/Surgical History: reviewed Past Medical History:  Diagnosis Date   Allergy    Anemia    Anxiety    past hx    Arthritis    Asthma     Atrial flutter (HCC)    past history- not current   Breast cancer (HCC)    CAD (coronary artery disease) CARDIOLOGIST--  DR Clifton James   mild non-obstructive cad   Cancer (HCC)    right   Cataract    bilaterally removed    Chronic constipation    Chronic kidney disease    stage 3 ckd, interstitial cystitis   Concussion    x 3   COPD (chronic obstructive pulmonary disease) (HCC)    Depression    past hx    Dyspnea    Dysrhythmia    Family history of adverse reaction to anesthesia    Family history of malignant hyperthermia    father had this   Fibromyalgia    Frequency of urination    GERD (gastroesophageal reflux disease)    H/O hiatal hernia    History of basal cell carcinoma excision    X2   History of breast cancer ONCOLOGIST-- DR Darnelle Catalan---  NO RECURRANCE   DX 07/2012;  LOW GRADE DCIS  ER+PR+  ----  S/P RIGHT LUMPECTOMY WITH NEGATIVE MARGINS/   RADIATION ENDED 11/2012   History of chronic bronchitis    History of colonic polyps    History of tachycardia    CONTROLLED  WITH ATENOLOL   Hyperlipidemia    Hypertension    Neuromuscular disorder (HCC)    fibromyalgia   Neuropathy    Osteoporosis 01/2019   T score -2.2 stable/improved from prior study   Pelvic pain    Personal history of radiation therapy    Pneumonia    S/P radiation therapy 11/12/12 - 12/05/12   right Breast   Sepsis (HCC) 2014   from UTI    Sinus headache    Sjogren's disease (HCC)    Urgency of urination     Past Surgical History:  Procedure Laterality Date   83 HOUR PH STUDY N/A 04/03/2016   Procedure: 24 HOUR PH STUDY;  Surgeon: Ruffin Frederick, MD;  Location: [facility];  Service: Gastroenterology;  Laterality: N/A;   BREAST BIOPSY Right 08/23/2012   ADH   BREAST BIOPSY Right 10/11/2012   Ductal Carcinoma   BREAST EXCISIONAL BIOPSY Right 09/04/2013   benign   BREAST LUMPECTOMY Right 10/11/2012   W/ [facility] BX   CARDIAC CATHETERIZATION  09-13-2007  DR Riley Kill   WELL-PRESERVED  LVF/  DIFFUSE SCATTERED CORONARY CALCIFACATION AND ATHEROSCLEROSIS WITHOUT OBSTRUCTION   CARDIAC CATHETERIZATION  08-04-2010  DR MCALHANY   NON-OBSTRUCTIVE CAD/  pLAD 40%/  oLAD 30%/  mLAD 30%/  pRCA 30%/  EF 60%   CARDIOVASCULAR STRESS TEST  06-18-2012  DR McALHANY   LOW RISK NUCLEAR STUDY/  SMALL FIXED AREA OF MODERATELY DECREASED UPTAKE IN ANTEROSEPTAL WALL WHICH MAY BE ARTIFACTUAL/  NO ISCHEMIA/  EF 68%  COLONOSCOPY  09/29/2010   CYSTOSCOPY     CYSTOSCOPY WITH HYDRODISTENSION AND BIOPSY N/A 03/06/2014   Procedure: CYSTOSCOPY/HYDRODISTENSION/ INSTILATION OF MARCAINE AND PYRIDIUM;  Surgeon: Kathi Ludwig, MD;  Location: [facility];  Service: Urology;  Laterality: N/A;   CYSTOSCOPY/URETEROSCOPY/HOLMIUM LASER/STENT PLACEMENT Left 03/21/2023   Procedure: CYSTOSCOPY LEFT RETROGRADE PYELOGRAM, LEFT URETEROSCOPY, HOLMIUM LASER LITHOTRIPSYM, AND LEFT URETERAL STENT PLACEMENT;  Surgeon: Rene Paci, MD;  Location: [facility];  Service: Urology;  Laterality: Left;  60 MINUTES   ESOPHAGEAL MANOMETRY N/A 04/03/2016   Procedure: ESOPHAGEAL MANOMETRY (EM);  Surgeon: Ruffin Frederick, MD;  Location: [facility];  Service: Gastroenterology;  Laterality: N/A;   EXTRACORPOREAL SHOCK WAVE LITHOTRIPSY Left 02/06/2019   Procedure: EXTRACORPOREAL SHOCK WAVE LITHOTRIPSY (ESWL);  Surgeon: Ihor Gully, MD;  Location: [facility];  Service: Urology;  Laterality: Left;   NASAL SINUS SURGERY  1985   ORIF RIGHT ANKLE  FX  2006   POLYPECTOMY     REMOVAL VOCAL CORD CYST  01/2013   RIGHT BREAST BX  08/23/2012   RIGHT HAND SURGERY  X3  LAST ONE 2009   INCLUDES  ORIF RIGHT 5TH FINGER AND REVISION TWICE   SKIN CANCER EXCISION     face,arms   TONSILLECTOMY AND ADENOIDECTOMY  AGE 3   TOTAL ABDOMINAL HYSTERECTOMY W/ BILATERAL SALPINGOOPHORECTOMY  1982   W/  APPENDECTOMY   TRANSTHORACIC ECHOCARDIOGRAM  06/24/2012   GRADE I DIASTOLIC DYSFUNCTION/  EF 55-60%/  MILD MR   UPPER  GASTROINTESTINAL ENDOSCOPY      Medications: Current Outpatient Medications  Medication Sig Dispense Refill   acetaminophen (TYLENOL) 500 MG tablet Take 1,000 mg by mouth every 6 (six) hours as needed for mild pain or headache.      albuterol (VENTOLIN HFA) 108 (90 Base) MCG/ACT inhaler Inhale 2 puffs into the lungs every 4 (four) hours as needed for wheezing or shortness of breath (coughing fits). 18 g 1   amLODipine (NORVASC) 5 MG tablet Take 1 tablet (5 mg total) by mouth daily. 90 tablet 3   aspirin EC 81 MG tablet Take 1 tablet (81 mg total) by mouth daily. 90 tablet 3   atenolol (TENORMIN) 25 MG tablet Take 1 tablet (25 mg total) by mouth 2 (two) times daily. Take 1 tablet by mouth 2 time a day 180 tablet 3   CALCIUM PO Take 500 mg by mouth daily.     carboxymethylcellul-glycerin (OPTIVE) 0.5-0.9 % ophthalmic solution Place 1 drop into both eyes daily as needed for dry eyes.     Cholecalciferol (VITAMIN D3) 50 MCG (2000 UT) CAPS Take 2,000 Units by mouth daily.     cyanocobalamin (VITAMIN B12) 1000 MCG tablet Take 1,000 mg by mouth daily with breakfast.     diclofenac Sodium (VOLTAREN) 1 % GEL Apply 2 g topically daily as needed (Back pain).     diphenhydrAMINE (BENADRYL) 25 mg capsule Take 25 mg by mouth every 6 (six) hours as needed for itching.     Docusate Sodium (DSS) 100 MG CAPS Take 100 mg by mouth daily as needed (Constipation).     DULoxetine (CYMBALTA) 30 MG capsule Take 1 capsule (30 mg total) by mouth daily. 30 capsule 1   DULoxetine (CYMBALTA) 60 MG capsule Take 1 capsule by mouth twice daily 180 capsule 0   EPINEPHrine 0.3 mg/0.3 mL IJ SOAJ injection Inject 0.3 mg into the muscle as needed for anaphylaxis. 2 each 1   famotidine (PEPCID) 20 MG tablet Take 1  tablet (20 mg total) by mouth 2 (two) times daily as needed for heartburn or indigestion. 60 tablet 1   fluticasone (FLONASE) 50 MCG/ACT nasal spray Use 2 spray(s) in each nostril once daily (Patient taking differently:  Place 2 sprays into both nostrils daily as needed for allergies or rhinitis.) 16 g 5   fluticasone (FLOVENT HFA) 110 MCG/ACT inhaler Inhale 2 puffs into the lungs 2 (two) times daily. (Patient taking differently: Inhale 2 puffs into the lungs daily as needed (Short of breath).) 1 each 5   fluticasone-salmeterol (ADVAIR) 250-50 MCG/ACT AEPB Inhale 1 puff into the lungs in the morning and at bedtime. 1 each 2   folic acid (FOLVITE) 1 MG tablet Take 1 tablet (1 mg total) by mouth daily.     furosemide (LASIX) 20 MG tablet Take 1 tablet (20 mg total) by mouth as needed for edema. 90 tablet 3   gabapentin (NEURONTIN) 300 MG capsule Take 300-900 mg by mouth See admin instructions. Take 600 mg in the morning 300 mg at lunch time and 900 mg at bedtime     hydroxychloroquine (PLAQUENIL) 200 MG tablet Take 200 mg by mouth daily.     hydrOXYzine (ATARAX) 10 MG tablet Take 1 tablet (10 mg total) by mouth 3 (three) times daily as needed. 30 tablet 1   linaclotide (LINZESS) 145 MCG CAPS capsule Take 1 capsule (145 mcg total) by mouth daily as needed. Lot: [id number]  Exp:11-2023 12 capsule 0   loratadine (CLARITIN) 10 MG tablet Take 10 mg by mouth daily as needed for rhinitis.     Multiple Vitamins-Minerals (MULTIVITAMIN WITH MINERALS) tablet Take 1 tablet by mouth daily. (Patient taking differently: Take 1 tablet by mouth daily. 50+)     nitroGLYCERIN (NITROSTAT) 0.4 MG SL tablet Place 1 tablet (0.4 mg total) under the tongue every 5 (five) minutes as needed for chest pain. 25 tablet 1   nystatin cream (MYCOSTATIN) Apply 1 application topically 2 (two) times daily. (Patient taking differently: Apply 1 application  topically daily as needed (vaginal yeast).) 30 g 2   pantoprazole (PROTONIX) 40 MG tablet Take 1 tablet (40 mg total) by mouth 2 (two) times daily. 90 tablet 1   pentosan polysulfate (ELMIRON) 100 MG capsule Take 1 capsule (100 mg total) by mouth 3 (three) times daily. Reported on 01/05/2016 (Patient taking  differently: Take 100 mg by mouth daily as needed (burning and pain). Reported on 01/05/2016) 90 capsule 11   polyethylene glycol (MIRALAX) 17 g packet Take 17 g by mouth 2 (two) times daily. 14 each 0   REPATHA SURECLICK 140 MG/ML SOAJ INJECT 140 MG INTO THE SKIN EVERY 14 DAYS 6 mL 0   sucralfate (CARAFATE) 1 GM/10ML suspension Take 10 mLs (1 g total) by mouth 4 (four) times daily -  with meals and at bedtime. (Patient taking differently: Take 1 g by mouth 2 (two) times daily.) 420 mL 1   traMADol (ULTRAM) 50 MG tablet Take 25 mg by mouth 2 (two) times daily.     traZODone (DESYREL) 100 MG tablet TAKE 1 TABLET BY MOUTH AT BEDTIME 90 tablet 0   Vitamin D, Ergocalciferol, (DRISDOL) 1.25 MG (50000 UNIT) CAPS capsule Take 1 capsule (50,000 Units total) by mouth every 7 (seven) days. 12x Weeks 5 capsule 3   No current facility-administered medications for this visit.    Allergies  Allergen Reactions   Clindamycin Hcl Shortness Of Breath and Rash   Penicillins Anaphylaxis   Rosuvastatin Anaphylaxis  Baclofen Other (See Comments) and Rash   Lincomycin Other (See Comments)   Prednisone Rash    Other reaction(s): Rash, Hives - can tolerate if she takes with benadryl    Codeine Hives and Other (See Comments)    headache Other reaction(s): Headache, Nausea, headache   Doxycycline     Skin rash   Erythromycin Hives   Haemophilus Influenzae Other (See Comments)    Local reaction at the site Local reaction at the site   Haemophilus Influenzae Vaccines Other (See Comments)    Local reaction at site   Latex Hives   Levofloxacin     Other reaction(s): sick   Other Other (See Comments)    Father had malignant hyperthermia   Pentazocine Lactate Other (See Comments)    HALLUCINATION   Pneumococcal Vaccine Polyvalent Hives, Swelling and Other (See Comments)    REACTION: redness, swelling, and hives at injection site   Tamoxifen Nausea And Vomiting and Other (See Comments)    HEADACHE Other  reaction(s): Headache, Nausea   Fluzone [Influenza Virus Vaccine] Hives    Local reaction at the site    Diagnoses:  Major depressive disorder, recurrent, moderate.  Generalized anxiety disorder  Plan of Care: I will meet with the patient every 2 weeks general plan is to work with the patient to reduce anxiety and depression as well as explore sources for depression and anxiety.  A formal treatment plan will be developed in the next session.   [place], [facility]

## 2023-06-25 NOTE — Progress Notes (Unsigned)
                Konnor Vondrasek M Santrice Muzio, LCMHC 

## 2023-06-26 ENCOUNTER — Ambulatory Visit: Payer: Medicare Other | Attending: Family Medicine

## 2023-06-26 DIAGNOSIS — M542 Cervicalgia: Secondary | ICD-10-CM | POA: Diagnosis not present

## 2023-06-26 DIAGNOSIS — G8929 Other chronic pain: Secondary | ICD-10-CM | POA: Insufficient documentation

## 2023-06-26 DIAGNOSIS — R278 Other lack of coordination: Secondary | ICD-10-CM | POA: Insufficient documentation

## 2023-06-26 DIAGNOSIS — H8112 Benign paroxysmal vertigo, left ear: Secondary | ICD-10-CM | POA: Diagnosis not present

## 2023-06-26 DIAGNOSIS — R2681 Unsteadiness on feet: Secondary | ICD-10-CM | POA: Diagnosis not present

## 2023-06-26 DIAGNOSIS — M545 Low back pain, unspecified: Secondary | ICD-10-CM | POA: Diagnosis not present

## 2023-06-26 DIAGNOSIS — R252 Cramp and spasm: Secondary | ICD-10-CM | POA: Diagnosis not present

## 2023-06-26 DIAGNOSIS — R42 Dizziness and giddiness: Secondary | ICD-10-CM | POA: Insufficient documentation

## 2023-06-26 DIAGNOSIS — M6281 Muscle weakness (generalized): Secondary | ICD-10-CM | POA: Insufficient documentation

## 2023-06-26 NOTE — Therapy (Signed)
OUTPATIENT PHYSICAL THERAPY NEURO TREATMENT   Patient Name: [name] MRN: [id number] DOB:[birth year], 77 y.o., female Today's Date: 06/26/2023  PCP: Danise Edge, MD REFERRING PROVIDER: Danise Edge, MD END OF SESSION:  PT End of Session - 06/26/23 1013     Visit Number 9    Number of Visits 16    Date for PT Re-Evaluation 07/05/23    Authorization Type [employer] medicare    Progress Note Due on Visit 10    PT Start Time 1015    PT Stop Time 1055    PT Time Calculation (min) 40 min    Activity Tolerance Patient tolerated treatment well    Behavior During Therapy WFL for tasks assessed/performed            Past Medical History:  Diagnosis Date   Allergy    Anemia    Anxiety    past hx    Arthritis    Asthma    Atrial flutter (HCC)    past history- not current   Breast cancer (HCC)    CAD (coronary artery disease) CARDIOLOGIST--  DR Clifton James   mild non-obstructive cad   Cancer (HCC)    right   Cataract    bilaterally removed    Chronic constipation    Chronic kidney disease    stage 3 ckd, interstitial cystitis   Concussion    x 3   COPD (chronic obstructive pulmonary disease) (HCC)    Depression    past hx    Dyspnea    Dysrhythmia    Family history of adverse reaction to anesthesia    Family history of malignant hyperthermia    father had this   Fibromyalgia    Frequency of urination    GERD (gastroesophageal reflux disease)    H/O hiatal hernia    History of basal cell carcinoma excision    X2   History of breast cancer ONCOLOGIST-- DR Darnelle Catalan---  NO RECURRANCE   DX 07/2012;  LOW GRADE DCIS  ER+PR+  ----  S/P RIGHT LUMPECTOMY WITH NEGATIVE MARGINS/   RADIATION ENDED 11/2012   History of chronic bronchitis    History of colonic polyps    History of tachycardia    CONTROLLED  WITH ATENOLOL   Hyperlipidemia    Hypertension    Neuromuscular disorder (HCC)    fibromyalgia   Neuropathy    Osteoporosis 01/2019   T score -2.2 stable/improved from  prior study   Pelvic pain    Personal history of radiation therapy    Pneumonia    S/P radiation therapy 11/12/12 - 12/05/12   right Breast   Sepsis (HCC) 2014   from UTI    Sinus headache    Sjogren's disease (HCC)    Urgency of urination    Past Surgical History:  Procedure Laterality Date   62 HOUR PH STUDY N/A 04/03/2016   Procedure: 24 HOUR PH STUDY;  Surgeon: Ruffin Frederick, MD;  Location: [facility];  Service: Gastroenterology;  Laterality: N/A;   BREAST BIOPSY Right 08/23/2012   ADH   BREAST BIOPSY Right 10/11/2012   Ductal Carcinoma   BREAST EXCISIONAL BIOPSY Right 09/04/2013   benign   BREAST LUMPECTOMY Right 10/11/2012   W/ SLN BX   CARDIAC CATHETERIZATION  09-13-2007  DR Riley Kill   WELL-PRESERVED LVF/  DIFFUSE SCATTERED CORONARY CALCIFACATION AND ATHEROSCLEROSIS WITHOUT OBSTRUCTION   CARDIAC CATHETERIZATION  08-04-2010  DR Clifton James   NON-OBSTRUCTIVE CAD/  pLAD 40%/  oLAD 30%/  mLAD  30%/  pRCA 30%/  EF 60%   CARDIOVASCULAR STRESS TEST  06-18-2012  DR McALHANY   LOW RISK NUCLEAR STUDY/  SMALL FIXED AREA OF MODERATELY DECREASED UPTAKE IN ANTEROSEPTAL WALL WHICH MAY BE ARTIFACTUAL/  NO ISCHEMIA/  EF 68%   COLONOSCOPY  09/29/2010   CYSTOSCOPY     CYSTOSCOPY WITH HYDRODISTENSION AND BIOPSY N/A 03/06/2014   Procedure: CYSTOSCOPY/HYDRODISTENSION/ INSTILATION OF MARCAINE AND PYRIDIUM;  Surgeon: Kathi Ludwig, MD;  Location: [facility];  Service: Urology;  Laterality: N/A;   CYSTOSCOPY/URETEROSCOPY/HOLMIUM LASER/STENT PLACEMENT Left 03/21/2023   Procedure: CYSTOSCOPY LEFT RETROGRADE PYELOGRAM, LEFT URETEROSCOPY, HOLMIUM LASER LITHOTRIPSYM, AND LEFT URETERAL STENT PLACEMENT;  Surgeon: Rene Paci, MD;  Location: [facility];  Service: Urology;  Laterality: Left;  60 MINUTES   ESOPHAGEAL MANOMETRY N/A 04/03/2016   Procedure: ESOPHAGEAL MANOMETRY (EM);  Surgeon: Ruffin Frederick, MD;  Location: [facility];  Service: Gastroenterology;   Laterality: N/A;   EXTRACORPOREAL SHOCK WAVE LITHOTRIPSY Left 02/06/2019   Procedure: EXTRACORPOREAL SHOCK WAVE LITHOTRIPSY (ESWL);  Surgeon: Ihor Gully, MD;  Location: [facility];  Service: Urology;  Laterality: Left;   NASAL SINUS SURGERY  1985   ORIF RIGHT ANKLE  FX  2006   POLYPECTOMY     REMOVAL VOCAL CORD CYST  01/2013   RIGHT BREAST BX  08/23/2012   RIGHT HAND SURGERY  X3  LAST ONE 2009   INCLUDES  ORIF RIGHT 5TH FINGER AND REVISION TWICE   SKIN CANCER EXCISION     face,arms   TONSILLECTOMY AND ADENOIDECTOMY  AGE 89   TOTAL ABDOMINAL HYSTERECTOMY W/ BILATERAL SALPINGOOPHORECTOMY  1982   W/  APPENDECTOMY   TRANSTHORACIC ECHOCARDIOGRAM  06/24/2012   GRADE I DIASTOLIC DYSFUNCTION/  EF 55-60%/  MILD MR   UPPER GASTROINTESTINAL ENDOSCOPY     Patient Active Problem List   Diagnosis Date Noted   Bronchiolitis 06/19/2023   Multiple drug allergies 05/09/2023   Recurrent infections 05/09/2023   Rash and other nonspecific skin eruption 05/09/2023   Local reaction to hymenoptera sting 05/09/2023   Allergic reaction 05/09/2023   Other adverse food reactions, not elsewhere classified, subsequent encounter 05/09/2023   Moderate persistent asthma without complication 05/09/2023   Other allergic rhinitis 05/09/2023   Weight loss 05/01/2023   Strep pharyngitis 04/24/2023   Common bile duct dilatation 02/13/2023   Oral herpes 02/05/2023   Tongue lesion 02/05/2023   Drug reaction 02/05/2023   Cellulitis of breast 02/05/2023   Elevated alkaline phosphatase level 01/25/2023   Facial trauma 01/22/2023   Vertigo 01/22/2023   Intertrigo 12/13/2022   Hyperkalemia 09/28/2022   Anemia 09/28/2022   Tremor 07/05/2022   RUQ pain 07/05/2022   Leukocytosis 07/05/2022   Chronic renal insufficiency 03/21/2022   Sjogren's disease (HCC) 12/12/2021   Vocal cord cyst 05/18/2021   High serum vitamin D 05/18/2021   Dysuria 03/09/2021   Vitamin B12 deficiency 03/08/2021   Preventative health care  03/08/2021   Hyperglycemia 03/08/2021   Low back pain 09/08/2020   OSA and COPD overlap syndrome (HCC) 06/10/2020   Recurrent falls 05/02/2020   Statin intolerance 02/29/2020   RLS (restless legs syndrome) 11/09/2019   Kidney stone 02/26/2019   Recurrent sinusitis 02/25/2019   Hx of colonic polyp 02/25/2019   DDD (degenerative disc disease), cervical 09/17/2018   Degenerative scoliosis 04/22/2018   Primary insomnia 06/25/2017   Chronic interstitial cystitis 02/01/2017   Chronic cough 01/09/2017   Right arm pain 07/20/2016   Deviated septum 05/12/2016   Nasal  turbinate hypertrophy 05/12/2016   Dysphagia    Allergic rhinitis 01/25/2016   Other and combined forms of senile cataract 07/15/2015   Dyspnea    Left hip pain 04/30/2015   Sciatica 04/30/2015   Knee pain, right 04/30/2015   Bilateral carpal tunnel syndrome 03/04/2015   Hyperlipidemia 03/01/2015   Pulmonary nodules 02/12/2015   External hemorrhoids 02/05/2015   Chronic night sweats 01/08/2015   Atypical chest pain 01/01/2015   Memory impairment 05/22/2014   Peripheral neuropathy 05/22/2014   Abdominal pain 10/26/2013   Headache 10/13/2013   Arthralgia 08/18/2013   ANA positive 07/29/2013   Depression 02/05/2013   Family history of malignant hyperthermia 02/05/2013   Malignant neoplasm of lower-outer quadrant of right breast of female, estrogen receptor positive (HCC) 01/03/2013   Splenic lesion 09/02/2012   Asthma, allergic 08/05/2012   Acute bronchitis with COPD (HCC) 06/03/2012   GERD (gastroesophageal reflux disease) 06/03/2012   Edema 04/08/2012   Stricture and stenosis of esophagus 10/19/2011   Constipation 07/21/2011   Nonspecific (abnormal) findings on radiological and other examination of biliary tract 07/21/2011   Osteoporosis 04/17/2011   Leg pain 02/24/2011   FIBROCYSTIC BREAST DISEASE, HX OF 10/18/2010   Essential hypertension 08/25/2010   CAD in native artery 08/25/2010   TOBACCO ABUSE, HX OF  08/25/2010   GENERALIZED ANXIETY DISORDER 08/03/2010   Breast pain 01/11/2010   Fibromyalgia 01/11/2010    ONSET DATE: 04/24/23 REFERRING DIAG:  R42 (ICD-10-CM) - Dizzy spells  R53.1 (ICD-10-CM) - Weakness   THERAPY DIAG:  Unsteadiness on feet  Dizziness and giddiness  Muscle weakness (generalized)  BPPV (benign paroxysmal positional vertigo), left  Rationale for Evaluation and Treatment: Rehabilitation  SUBJECTIVE:                                                                                                                                                                                           SUBJECTIVE STATEMENT: Patient reports her ablation procedure went well, states she overdid it over the weekend and her back is hurting 4/10 pain. Patient states she has been feeling dizzy and an ongoing headache since procedure, state she wonders if it is related to anesthesia.  Pt accompanied by: self  PERTINENT HISTORY: chronic back pain, (scheduled for back ablation in June 2024), h/o vertigo PAIN:  Are you having pain? Yes: NPRS scale: notes mild back pain Pain location: low back  Pain description: chronic Aggravating factors: PT will monitor Relieving factors: *has upcoming intervention scheduled PRECAUTIONS: Fall WEIGHT BEARING RESTRICTIONS: No FALLS: Has patient fallen in last 6 months? Yes. Number of falls 1.  LIVING ENVIRONMENT: Lives with: lives with their  spouse Lives in: House/apartment Stairs: One step with one rail PLOF: Independent with basic ADLs-- notes declining mobility.  PATIENT GOALS: "stop the spinning and be able to walk and not falter."  OBJECTIVE:  (Measures in this section from initial evaluation unless otherwise noted) COGNITION: Overall cognitive status:  Patient mentions memory changes since first fall where she hit her head -- she is unable to recall date SENSATION:  Patient reports numbness and cold sensations in feet. LOWER EXTREMITY MMT:    MMT Right Eval Left Eval  Hip flexion 3+/5 4/5  Knee flexion 4/5 4/5  Knee extension 5/5 5/5  Ankle dorsiflexion 3+/5 3+/5  Ankle plantarflexion    Ankle inversion    Ankle eversion    (Blank rows = not tested) BED MOBILITY:  Sit to supine Complete Independence Supine to sit Complete Independence Rolling to Right Complete Independence Rolling to Left Complete Independence *notes "I'm in a fog" with returning to sitting GAIT: Gait pattern:  slowed pace with SPC and wide BOS Distance walked: 100 ft Assistive device utilized: Single point cane FUNCTIONAL TESTS:  Berg= 40/56  UPDATE: 50/56 ON 06/01/23 5 time sit to stand=20.46 seconds with Ues on legs 73M=0.64 m/sec or 2.10 ft/sec  UPDATE: 3.44 ft/sec ON 06/01/23 PATIENT SURVEYS:  FOTO n/a--not measured  due to vertigo/balance  VESTIBULAR ASSESSMENT: GENERAL OBSERVATION: Gets intermittent dizziness SYMPTOM BEHAVIOR:  Subjective history: Has intermittent h/o vertigo  Non-Vestibular symptoms:  none  Type of dizziness: Spinning/Vertigo, Unsteady with head/body turns, and Lightheadedness/Faint  Frequency: daily   Duration: seconds to minutes  Aggravating factors: Worse in the morning and with getting up after sitting for longer periods  Relieving factors: unsure  Progression of symptoms: unchanged OCULOMOTOR EXAM:  Ocular Alignment: normal  Ocular ROM: No Limitations  Spontaneous Nystagmus: absent  Gaze-Induced Nystagmus: absent  Smooth Pursuits: intact but notes target seems "out of reach"   Saccades: intact VESTIBULAR - OCULAR REFLEX:   Slow VOR: Comment: 2 reps provokes a sensation of bad spinning  Head-Impulse Test:  Did not test today due to inability to tolerate slow VOR  Dynamic Visual Acuity: to be assessed POSITIONAL TESTING: Right Dix-Hallpike: no nystagmus Left Dix-Hallpike: no nystagmus Right Roll Test: TBA Left Roll Test: TBA Right Sidelying: no nystagmus Left Sidelying: no nystagmus *Notes symptoms with  return to sitting.    [facility] Adult PT Treatment:                                                DATE: 06/26/2023 Neuromuscular re-ed: Seated: Habituation with horizontal head turns 3" hold each way x 30" Habituation with vertical head turns 3" hold each way x 30" VORx1 yaw/pitch pane 3x30" each Walking slow head turns R/center/L --> gradual increase in speed R/L Therapeutic Activity: Fall recovery: transfer supine --> standing with UE support from mat table Eccentric eccentric step down (L SLS) Side stepping GTB crossed at ankles, gaze upright    [facility] Adult PT Treatment:                                                DATE: 06/20/2023 Therapeutic Exercise: Seated: Dynamic side bend reaches (gradually increasing ROM/head movt) Green SB rollout (gradually increasing ROM/spinal flexion) LAQ 2#AW  Bicep curl + overhead press 2#DB STS no HHA x10    [facility] Adult PT Treatment:                                                DATE: 06/01/23 Therapeutic Exercise: Sit<>stand without UE support x 10 reps Neuromuscular re-ed: Berg=50/56 today  Habituation with horizontal head turns in seated with minimal symptoms Habituation standing with horizontal head turns in standing with minimal dizziness Gait: 3.44 ft/sec With SPC x 100 ft  Self Care: Recommended/discussed beach trip and safety with walking-- using device and having family nearby.    PATIENT EDUCATION: Education details: HEP Person educated: Patient Education method: Explanation Education comprehension: verbalized understanding  HOME EXERCISE PROGRAM: Access Code: [email or web address] URL: [email or web address] Date: 06/01/2023 Prepared by: Margretta Ditty  Exercises - Sit to Stand with Armchair  - 2 x daily - 7 x weekly - 1 sets - 5 reps - Standing with Head Rotation  - 2 x daily - 7 x weekly - 1 sets - 10 reps - Seated Gaze Stabilization with Head Rotation  - 2 x daily - 7 x weekly - 1 sets - 10 reps - Seated Single Leg  Hip Abduction with Resistance  - 1 x daily - 7 x weekly - 3 sets - 10 reps - Supine Lower Trunk Rotation  - 2 x daily - 7 x weekly - 1 sets - 10 reps  GOALS: Goals reviewed with patient? Yes  SHORT TERM GOALS: Target date: 06/06/23  Patient will be independent with initial HEP.  Baseline: To be given Goal status: MET  2.  Patient will complete Berg, and FGA if indicated. Baseline: 40/56 (Berg) Goal status: MET   3.  Patient will report decreased dizziness by 50%. Baseline: n/a Goal status: MET  LONG TERM GOALS:  Target date: 07/05/23  Patient will be independent with advanced/ongoing HEP to improve outcomes and carryover.  Baseline: to be established. Goal status: INITIAL  2.  Patient will improve Berg balance score by 5 points to demo improved functional balance.  Baseline: 40/56; Berg=50/56  Goal status: MET ON 06/01/23  3. Patient will demo floor<>stand with UE support mod indep for improved functional strength.  Baseline: has h/o falls.  Goal status: MET  4. Patient will improve gait speed to 2.8 ft/sec to demo improving mobility and dec'd risk for falls.  Baseline: 3.44 ft/sec  Goal status: MET ON 06/01/23  ASSESSMENT:  CLINICAL IMPRESSION:  Patient demonstrated mod I with transfers from supine to standing with UE support from low mat table; noted mild LE weakness during transition from 1/2 kneel to standing. Decreased symptoms reported during walking with head turns right and left; patient tolerated gradual increase in head speed well with minimal increase in symptoms.    OBJECTIVE IMPAIRMENTS: Abnormal gait, decreased activity tolerance, decreased balance, decreased strength, dizziness, impaired sensation, and pain.   PLAN:  PT FREQUENCY: 2x/week  PT DURATION: 8 weeks  PLANNED INTERVENTIONS: Therapeutic exercises, Therapeutic activity, Neuromuscular re-education, Balance training, Gait training, Patient/Family education, Self Care, Joint mobilization, Vestibular  training, Canalith repositioning, Aquatic Therapy, Dry Needling, Electrical stimulation, Moist heat, Taping, and Manual therapy  PLAN FOR NEXT SESSION: Progress gait activities to tolerance, LE strength, balance. Check floor<>stand (does in gardening), work on standing corner balance to progress for home (narrowing BOS with head turns), single leg control, turning  towards the right. After beach trip plan to renew with goals of turning, single leg standing, and high level balance tasks.  Sanjuana Mae, PTA 06/26/2023 11:01 AM

## 2023-06-29 ENCOUNTER — Ambulatory Visit: Payer: Medicare Other

## 2023-06-29 DIAGNOSIS — G8929 Other chronic pain: Secondary | ICD-10-CM | POA: Diagnosis not present

## 2023-06-29 DIAGNOSIS — R252 Cramp and spasm: Secondary | ICD-10-CM | POA: Diagnosis not present

## 2023-06-29 DIAGNOSIS — H8112 Benign paroxysmal vertigo, left ear: Secondary | ICD-10-CM

## 2023-06-29 DIAGNOSIS — R2681 Unsteadiness on feet: Secondary | ICD-10-CM

## 2023-06-29 DIAGNOSIS — M542 Cervicalgia: Secondary | ICD-10-CM | POA: Diagnosis not present

## 2023-06-29 DIAGNOSIS — M6281 Muscle weakness (generalized): Secondary | ICD-10-CM | POA: Diagnosis not present

## 2023-06-29 DIAGNOSIS — M545 Low back pain, unspecified: Secondary | ICD-10-CM | POA: Diagnosis not present

## 2023-06-29 DIAGNOSIS — R278 Other lack of coordination: Secondary | ICD-10-CM | POA: Diagnosis not present

## 2023-06-29 DIAGNOSIS — R42 Dizziness and giddiness: Secondary | ICD-10-CM | POA: Diagnosis not present

## 2023-06-29 NOTE — Therapy (Signed)
OUTPATIENT PHYSICAL THERAPY NEURO TREATMENT   Patient Name: [name] MRN: [id number] DOB:Jan 08, 1946, 77 y.o., female Today's Date: 06/29/2023  PCP: Danise Edge, MD REFERRING PROVIDER: Danise Edge, MD END OF SESSION:  PT End of Session - 06/29/23 1018     Visit Number 10    Number of Visits 16    Date for PT Re-Evaluation 07/05/23    Authorization Type [employer] medicare    Progress Note Due on Visit 10    PT Start Time 1020    PT Stop Time 1100    PT Time Calculation (min) 40 min    Activity Tolerance Patient tolerated treatment well    Behavior During Therapy WFL for tasks assessed/performed            Past Medical History:  Diagnosis Date   Allergy    Anemia    Anxiety    past hx    Arthritis    Asthma    Atrial flutter (HCC)    past history- not current   Breast cancer (HCC)    CAD (coronary artery disease) CARDIOLOGIST--  DR Clifton James   mild non-obstructive cad   Cancer (HCC)    right   Cataract    bilaterally removed    Chronic constipation    Chronic kidney disease    stage 3 ckd, interstitial cystitis   Concussion    x 3   COPD (chronic obstructive pulmonary disease) (HCC)    Depression    past hx    Dyspnea    Dysrhythmia    Family history of adverse reaction to anesthesia    Family history of malignant hyperthermia    father had this   Fibromyalgia    Frequency of urination    GERD (gastroesophageal reflux disease)    H/O hiatal hernia    History of basal cell carcinoma excision    X2   History of breast cancer ONCOLOGIST-- DR Darnelle Catalan---  NO RECURRANCE   DX 07/2012;  LOW GRADE DCIS  ER+PR+  ----  S/P RIGHT LUMPECTOMY WITH NEGATIVE MARGINS/   RADIATION ENDED 11/2012   History of chronic bronchitis    History of colonic polyps    History of tachycardia    CONTROLLED  WITH ATENOLOL   Hyperlipidemia    Hypertension    Neuromuscular disorder (HCC)    fibromyalgia   Neuropathy    Osteoporosis 01/2019   T score -2.2 stable/improved from  prior study   Pelvic pain    Personal history of radiation therapy    Pneumonia    S/P radiation therapy 11/12/12 - 12/05/12   right Breast   Sepsis (HCC) 2014   from UTI    Sinus headache    Sjogren's disease (HCC)    Urgency of urination    Past Surgical History:  Procedure Laterality Date   77 HOUR PH STUDY N/A 04/03/2016   Procedure: 24 HOUR PH STUDY;  Surgeon: Ruffin Frederick, MD;  Location: [facility];  Service: Gastroenterology;  Laterality: N/A;   BREAST BIOPSY Right 08/23/2012   ADH   BREAST BIOPSY Right 10/11/2012   Ductal Carcinoma   BREAST EXCISIONAL BIOPSY Right 09/04/2013   benign   BREAST LUMPECTOMY Right 10/11/2012   W/ SLN BX   CARDIAC CATHETERIZATION  09-13-2007  DR Riley Kill   WELL-PRESERVED LVF/  DIFFUSE SCATTERED CORONARY CALCIFACATION AND ATHEROSCLEROSIS WITHOUT OBSTRUCTION   CARDIAC CATHETERIZATION  08-04-2010  DR Clifton James   NON-OBSTRUCTIVE CAD/  pLAD 40%/  oLAD 30%/  mLAD  30%/  pRCA 30%/  EF 60%   CARDIOVASCULAR STRESS TEST  06-18-2012  DR McALHANY   LOW RISK NUCLEAR STUDY/  SMALL FIXED AREA OF MODERATELY DECREASED UPTAKE IN ANTEROSEPTAL WALL WHICH MAY BE ARTIFACTUAL/  NO ISCHEMIA/  EF 68%   COLONOSCOPY  09/29/2010   CYSTOSCOPY     CYSTOSCOPY WITH HYDRODISTENSION AND BIOPSY N/A 03/06/2014   Procedure: CYSTOSCOPY/HYDRODISTENSION/ INSTILATION OF MARCAINE AND PYRIDIUM;  Surgeon: Kathi Ludwig, MD;  Location: [facility];  Service: Urology;  Laterality: N/A;   CYSTOSCOPY/URETEROSCOPY/HOLMIUM LASER/STENT PLACEMENT Left 03/21/2023   Procedure: CYSTOSCOPY LEFT RETROGRADE PYELOGRAM, LEFT URETEROSCOPY, HOLMIUM LASER LITHOTRIPSYM, AND LEFT URETERAL STENT PLACEMENT;  Surgeon: Rene Paci, MD;  Location: [facility];  Service: Urology;  Laterality: Left;  60 MINUTES   ESOPHAGEAL MANOMETRY N/A 04/03/2016   Procedure: ESOPHAGEAL MANOMETRY (EM);  Surgeon: Ruffin Frederick, MD;  Location: [facility];  Service: Gastroenterology;   Laterality: N/A;   EXTRACORPOREAL SHOCK WAVE LITHOTRIPSY Left 02/06/2019   Procedure: EXTRACORPOREAL SHOCK WAVE LITHOTRIPSY (ESWL);  Surgeon: Ihor Gully, MD;  Location: [facility];  Service: Urology;  Laterality: Left;   NASAL SINUS SURGERY  1985   ORIF RIGHT ANKLE  FX  2006   POLYPECTOMY     REMOVAL VOCAL CORD CYST  01/2013   RIGHT BREAST BX  08/23/2012   RIGHT HAND SURGERY  X3  LAST ONE 2009   INCLUDES  ORIF RIGHT 5TH FINGER AND REVISION TWICE   SKIN CANCER EXCISION     face,arms   TONSILLECTOMY AND ADENOIDECTOMY  AGE 84   TOTAL ABDOMINAL HYSTERECTOMY W/ BILATERAL SALPINGOOPHORECTOMY  1982   W/  APPENDECTOMY   TRANSTHORACIC ECHOCARDIOGRAM  06/24/2012   GRADE I DIASTOLIC DYSFUNCTION/  EF 55-60%/  MILD MR   UPPER GASTROINTESTINAL ENDOSCOPY     Patient Active Problem List   Diagnosis Date Noted   Bronchiolitis 06/19/2023   Multiple drug allergies 05/09/2023   Recurrent infections 05/09/2023   Rash and other nonspecific skin eruption 05/09/2023   Local reaction to hymenoptera sting 05/09/2023   Allergic reaction 05/09/2023   Other adverse food reactions, not elsewhere classified, subsequent encounter 05/09/2023   Moderate persistent asthma without complication 05/09/2023   Other allergic rhinitis 05/09/2023   Weight loss 05/01/2023   Strep pharyngitis 04/24/2023   Common bile duct dilatation 02/13/2023   Oral herpes 02/05/2023   Tongue lesion 02/05/2023   Drug reaction 02/05/2023   Cellulitis of breast 02/05/2023   Elevated alkaline phosphatase level 01/25/2023   Facial trauma 01/22/2023   Vertigo 01/22/2023   Intertrigo 12/13/2022   Hyperkalemia 09/28/2022   Anemia 09/28/2022   Tremor 07/05/2022   RUQ pain 07/05/2022   Leukocytosis 07/05/2022   Chronic renal insufficiency 03/21/2022   Sjogren's disease (HCC) 12/12/2021   Vocal cord cyst 05/18/2021   High serum vitamin D 05/18/2021   Dysuria 03/09/2021   Vitamin B12 deficiency 03/08/2021   Preventative health care  03/08/2021   Hyperglycemia 03/08/2021   Low back pain 09/08/2020   OSA and COPD overlap syndrome (HCC) 06/10/2020   Recurrent falls 05/02/2020   Statin intolerance 02/29/2020   RLS (restless legs syndrome) 11/09/2019   Kidney stone 02/26/2019   Recurrent sinusitis 02/25/2019   Hx of colonic polyp 02/25/2019   DDD (degenerative disc disease), cervical 09/17/2018   Degenerative scoliosis 04/22/2018   Primary insomnia 06/25/2017   Chronic interstitial cystitis 02/01/2017   Chronic cough 01/09/2017   Right arm pain 07/20/2016   Deviated septum 05/12/2016   Nasal  turbinate hypertrophy 05/12/2016   Dysphagia    Allergic rhinitis 01/25/2016   Other and combined forms of senile cataract 07/15/2015   Dyspnea    Left hip pain 04/30/2015   Sciatica 04/30/2015   Knee pain, right 04/30/2015   Bilateral carpal tunnel syndrome 03/04/2015   Hyperlipidemia 03/01/2015   Pulmonary nodules 02/12/2015   External hemorrhoids 02/05/2015   Chronic night sweats 01/08/2015   Atypical chest pain 01/01/2015   Memory impairment 05/22/2014   Peripheral neuropathy 05/22/2014   Abdominal pain 10/26/2013   Headache 10/13/2013   Arthralgia 08/18/2013   ANA positive 07/29/2013   Depression 02/05/2013   Family history of malignant hyperthermia 02/05/2013   Malignant neoplasm of lower-outer quadrant of right breast of female, estrogen receptor positive (HCC) 01/03/2013   Splenic lesion 09/02/2012   Asthma, allergic 08/05/2012   Acute bronchitis with COPD (HCC) 06/03/2012   GERD (gastroesophageal reflux disease) 06/03/2012   Edema 04/08/2012   Stricture and stenosis of esophagus 10/19/2011   Constipation 07/21/2011   Nonspecific (abnormal) findings on radiological and other examination of biliary tract 07/21/2011   Osteoporosis 04/17/2011   Leg pain 02/24/2011   FIBROCYSTIC BREAST DISEASE, HX OF 10/18/2010   Essential hypertension 08/25/2010   CAD in native artery 08/25/2010   TOBACCO ABUSE, HX OF  08/25/2010   GENERALIZED ANXIETY DISORDER 08/03/2010   Breast pain 01/11/2010   Fibromyalgia 01/11/2010    ONSET DATE: 04/24/23 REFERRING DIAG:  R42 (ICD-10-CM) - Dizzy spells  R53.1 (ICD-10-CM) - Weakness   THERAPY DIAG:  Unsteadiness on feet  Dizziness and giddiness  Muscle weakness (generalized)  BPPV (benign paroxysmal positional vertigo), left  Chronic bilateral low back pain without sciatica  Rationale for Evaluation and Treatment: Rehabilitation  SUBJECTIVE:                                                                                                                                                                                           SUBJECTIVE STATEMENT: Patient reports she used her resistance bands at home and her back has been hurting since exercising. Patient states she has had 3 episodes of dizziness when she stood up top fast and turned corner too fast. Patient states she was sore in her legs after last PT session but did more exercises with the band that same night. Patient states she feels off-balance when objects or people are close by when she is walking. Patient states she wants to return to church but is wary of people hugging her and throwing her off balance.   Pt accompanied by: self  PERTINENT HISTORY: chronic back pain, (scheduled for back ablation in June 2024),  h/o vertigo PAIN:  Are you having pain? Yes: NPRS scale: notes mild back pain Pain location: low back  Pain description: chronic Aggravating factors: PT will monitor Relieving factors: *has upcoming intervention scheduled PRECAUTIONS: Fall WEIGHT BEARING RESTRICTIONS: No FALLS: Has patient fallen in last 6 months? Yes. Number of falls 1.  LIVING ENVIRONMENT: Lives with: lives with their spouse Lives in: House/apartment Stairs: One step with one rail PLOF: Independent with basic ADLs-- notes declining mobility.  PATIENT GOALS: "stop the spinning and be able to walk and not  falter."  OBJECTIVE:  (Measures in this section from initial evaluation unless otherwise noted) COGNITION: Overall cognitive status:  Patient mentions memory changes since first fall where she hit her head -- she is unable to recall date SENSATION:  Patient reports numbness and cold sensations in feet. LOWER EXTREMITY MMT:   MMT Right Eval Left Eval  Hip flexion 3+/5 4/5  Knee flexion 4/5 4/5  Knee extension 5/5 5/5  Ankle dorsiflexion 3+/5 3+/5  Ankle plantarflexion    Ankle inversion    Ankle eversion    (Blank rows = not tested) BED MOBILITY:  Sit to supine Complete Independence Supine to sit Complete Independence Rolling to Right Complete Independence Rolling to Left Complete Independence *notes "I'm in a fog" with returning to sitting GAIT: Gait pattern:  slowed pace with SPC and wide BOS Distance walked: 100 ft Assistive device utilized: Single point cane FUNCTIONAL TESTS:  Berg= 40/56  UPDATE: 50/56 ON 06/01/23 5 time sit to stand=20.46 seconds with Ues on legs 22M=0.64 m/sec or 2.10 ft/sec  UPDATE: 3.44 ft/sec ON 06/01/23 PATIENT SURVEYS:  FOTO n/a--not measured  due to vertigo/balance  VESTIBULAR ASSESSMENT: GENERAL OBSERVATION: Gets intermittent dizziness SYMPTOM BEHAVIOR:  Subjective history: Has intermittent h/o vertigo  Non-Vestibular symptoms:  none  Type of dizziness: Spinning/Vertigo, Unsteady with head/body turns, and Lightheadedness/Faint  Frequency: daily   Duration: seconds to minutes  Aggravating factors: Worse in the morning and with getting up after sitting for longer periods  Relieving factors: unsure  Progression of symptoms: unchanged OCULOMOTOR EXAM:  Ocular Alignment: normal  Ocular ROM: No Limitations  Spontaneous Nystagmus: absent  Gaze-Induced Nystagmus: absent  Smooth Pursuits: intact but notes target seems "out of reach"   Saccades: intact VESTIBULAR - OCULAR REFLEX:   Slow VOR: Comment: 2 reps provokes a sensation of bad  spinning  Head-Impulse Test:  Did not test today due to inability to tolerate slow VOR  Dynamic Visual Acuity: to be assessed POSITIONAL TESTING: Right Dix-Hallpike: no nystagmus Left Dix-Hallpike: no nystagmus Right Roll Test: TBA Left Roll Test: TBA Right Sidelying: no nystagmus Left Sidelying: no nystagmus *Notes symptoms with return to sitting.    [facility] Adult PT Treatment:                                                DATE: 06/29/2023 Therapeutic Exercise: Seated: green SB roll out flexion stretch Dynamic side bend stretches --> gradually adding cervical flexion range  Neuromuscular re-ed: Habituation: Seated forward bending + sitting up looking up (discontinued d/t back pain) Walking + slow head turns  Standing in corner with chair in front: mREO yaw/pitch head turns --> head turns both planes mREC static standing 3x30"    [facility] Adult PT Treatment:  DATE: 06/26/2023 Neuromuscular re-ed: Seated: Habituation with horizontal head turns 3" hold each way x 30" Habituation with vertical head turns 3" hold each way x 30" VORx1 yaw/pitch pane 3x30" each Walking slow head turns R/center/L --> gradual increase in speed R/L Therapeutic Activity: Fall recovery: transfer supine --> standing with UE support from mat table Eccentric eccentric step down (L SLS) Side stepping GTB crossed at ankles, gaze upright    [facility] Adult PT Treatment:                                                DATE: 06/20/2023 Therapeutic Exercise: Seated: Dynamic side bend reaches (gradually increasing ROM/head movt) Green SB rollout (gradually increasing ROM/spinal flexion) LAQ 2#AW  Bicep curl + overhead press 2#DB STS no HHA x10    PATIENT EDUCATION: Education details: Updated HEP Person educated: Patient Education method: Explanation Education comprehension: verbalized understanding  HOME EXERCISE PROGRAM: Access Code: [email or web address] URL:  [email or web address] Date: 06/29/2023 Prepared by: Carlynn Herald  Exercises - Sit to Stand with Armchair  - 2 x daily - 7 x weekly - 1 sets - 5 reps - Standing with Head Rotation  - 2 x daily - 7 x weekly - 1 sets - 10 reps - Seated Gaze Stabilization with Head Rotation  - 2 x daily - 7 x weekly - 1 sets - 10 reps - Seated Single Leg Hip Abduction with Resistance  - 1 x daily - 7 x weekly - 3 sets - 10 reps - Supine Lower Trunk Rotation  - 2 x daily - 7 x weekly - 1 sets - 10 reps - Standing Balance in Corner  - 1 x daily - 7 x weekly - 3 sets - 10 reps - Standing Balance in Corner with Eyes Closed  - 1 x daily - 7 x weekly - 3 sets - 10 reps - Corner Balance Feet Apart: Eyes Open With Head Turns  - 1 x daily - 7 x weekly - 3 sets - 10 reps - Standing with Head Nod  - 1 x daily - 7 x weekly - 3 sets - 10 reps   GOALS: Goals reviewed with patient? Yes  SHORT TERM GOALS: Target date: 06/06/23  Patient will be independent with initial HEP.  Baseline: To be given Goal status: MET  2.  Patient will complete Berg, and FGA if indicated. Baseline: 40/56 (Berg) Goal status: MET   3.  Patient will report decreased dizziness by 50%. Baseline: n/a Goal status: MET  LONG TERM GOALS:  Target date: 07/05/23  Patient will be independent with advanced/ongoing HEP to improve outcomes and carryover.  Baseline: to be established. Goal status: INITIAL  2.  Patient will improve Berg balance score by 5 points to demo improved functional balance.  Baseline: 40/56; Berg=50/56  Goal status: MET ON 06/01/23  3. Patient will demo floor<>stand with UE support mod indep for improved functional strength.  Baseline: has h/o falls.  Goal status: MET  4. Patient will improve gait speed to 2.8 ft/sec to demo improving mobility and dec'd risk for falls.  Baseline: 3.44 ft/sec  Goal status: MET ON 06/01/23  ASSESSMENT:  CLINICAL IMPRESSION:  Patient continues to exhibit bouts of dizziness  with quick head movements (L>R) and directional changes; occaiosonal moderate postural sway demonstrated with head turns to the left or when patient navigated by  close object while walking (therapist SBA during walking activity). Balance training continued in corner with chair in front for additional support and safety as needed. Initial postural sway demonstrated with eyes closed standing balance, however stability improved over time. Initial mild dizziness and posterior postural sway with added head turns and nods in standing; symptoms decreased with repetition and overall stability improved. Patient continues to exhibit mild LOB/postural sway with directional turns and with multi-tasking when walking.    OBJECTIVE IMPAIRMENTS: Abnormal gait, decreased activity tolerance, decreased balance, decreased strength, dizziness, impaired sensation, and pain.   PLAN:  PT FREQUENCY: 2x/week  PT DURATION: 8 weeks  PLANNED INTERVENTIONS: Therapeutic exercises, Therapeutic activity, Neuromuscular re-education, Balance training, Gait training, Patient/Family education, Self Care, Joint mobilization, Vestibular training, Canalith repositioning, Aquatic Therapy, Dry Needling, Electrical stimulation, Moist heat, Taping, and Manual therapy  PLAN FOR NEXT SESSION: Multi-tasking activities. Progress standing corner balance (narrowing BOS with head turns, EC with static head turns), single leg control, turning towards the right. Progress gait activities to tolerance, LE strength, balance  Sanjuana Mae, PTA 06/29/2023 11:13 AM

## 2023-07-03 ENCOUNTER — Ambulatory Visit (INDEPENDENT_AMBULATORY_CARE_PROVIDER_SITE_OTHER): Payer: Medicare Other | Admitting: Family Medicine

## 2023-07-03 ENCOUNTER — Encounter: Payer: Self-pay | Admitting: Family Medicine

## 2023-07-03 VITALS — BP 125/69 | HR 80 | Ht 64.5 in | Wt 125.0 lb

## 2023-07-03 DIAGNOSIS — M79672 Pain in left foot: Secondary | ICD-10-CM | POA: Diagnosis not present

## 2023-07-03 NOTE — Progress Notes (Signed)
Acute Office Visit  Subjective:     Patient ID: [name], female    DOB: 1946/02/24, 77 y.o.   MRN: [id number]  Chief Complaint  Patient presents with   Foot Problem    HPI Patient is in today for left foot pain.   Discussed the use of AI scribe software for clinical note transcription with the patient, who gave verbal consent to proceed.  History of Present Illness   The patient, with a history of a previous tendon injury, presents with a left foot injury sustained on July 4th. They report rolling their ankle inward during a fall, resulting in a painful swelling on the top of the foot. The pain is described as shooting up to the knee when stepping on the foot in a certain way. The patient notes that the pain and swelling have improved since the incident, but the discomfort persists, particularly under the foot, despite the absence of visible bruising.  The patient expresses concern about a potential tendon injury, as the current pain is reportedly more severe than a previous tendon injury on the other foot. They have been managing the pain with a cane, but report a persistent slight limp. The patient has been attempting some foot stretching exercises at home for relief.  The patient is also concerned about the financial implications of further diagnostic procedures, expressing a preference for conservative management before resorting to imaging. They are willing to try a lace-up ankle brace and continue with home exercises for a couple of weeks before considering an x-ray.             ROS All review of systems negative except what is listed in the HPI      Objective:    BP 125/69   Pulse 80   Ht 5' 4.5" (1.638 m)   Wt 125 lb (56.7 kg)   SpO2 99%   BMI 21.12 kg/m    Physical Exam Vitals reviewed.  Constitutional:      Appearance: Normal appearance.  Musculoskeletal:       Feet:  Skin:    General: Skin is warm and dry.     Findings: No bruising or  erythema.  Neurological:     Mental Status: She is alert and oriented to person, place, and time.  Psychiatric:        Mood and Affect: Mood normal.        Behavior: Behavior normal.        Thought Content: Thought content normal.        Judgment: Judgment normal.     No results found for any visits on 07/03/23.      Assessment & Plan:   Problem List Items Addressed This Visit   None Visit Diagnoses     Left foot pain    -  Primary     Patient reports pain and swelling following an inversion injury on July 4th. No significant bruising noted. Pain radiates up to the knee when stepping on the foot. Patient is able to walk with a limp using a cane. -Advise patient to use an ankle brace for stabilization and to perform range of motion exercises when resting. -Recommend over-the-counter pain management with Tylenol or ibuprofen as needed. -If no improvement in a couple of weeks, plan for an x-ray and possible referral to sports medicine.       No orders of the defined types were placed in this encounter.   Return if symptoms worsen or fail  to improve.  Clayborne Dana, NP

## 2023-07-05 ENCOUNTER — Other Ambulatory Visit (INDEPENDENT_AMBULATORY_CARE_PROVIDER_SITE_OTHER): Payer: Medicare Other

## 2023-07-05 DIAGNOSIS — G894 Chronic pain syndrome: Secondary | ICD-10-CM | POA: Diagnosis not present

## 2023-07-05 DIAGNOSIS — M79672 Pain in left foot: Secondary | ICD-10-CM | POA: Diagnosis not present

## 2023-07-05 DIAGNOSIS — I10 Essential (primary) hypertension: Secondary | ICD-10-CM

## 2023-07-05 DIAGNOSIS — E785 Hyperlipidemia, unspecified: Secondary | ICD-10-CM

## 2023-07-05 DIAGNOSIS — M5136 Other intervertebral disc degeneration, lumbar region: Secondary | ICD-10-CM | POA: Diagnosis not present

## 2023-07-05 LAB — CBC WITH DIFFERENTIAL/PLATELET
Basophils Absolute: 0.1 10*3/uL (ref 0.0–0.1)
Basophils Relative: 1 % (ref 0.0–3.0)
Eosinophils Absolute: 0.4 10*3/uL (ref 0.0–0.7)
Eosinophils Relative: 5.7 % — ABNORMAL HIGH (ref 0.0–5.0)
HCT: 35.6 % — ABNORMAL LOW (ref 36.0–46.0)
Hemoglobin: 11 g/dL — ABNORMAL LOW (ref 12.0–15.0)
Lymphocytes Relative: 18.7 % (ref 12.0–46.0)
Lymphs Abs: 1.3 10*3/uL (ref 0.7–4.0)
MCHC: 31 g/dL (ref 30.0–36.0)
MCV: 88.7 fl (ref 78.0–100.0)
Monocytes Absolute: 0.5 10*3/uL (ref 0.1–1.0)
Monocytes Relative: 8.1 % (ref 3.0–12.0)
Neutro Abs: 4.5 10*3/uL (ref 1.4–7.7)
Neutrophils Relative %: 66.5 % (ref 43.0–77.0)
Platelets: 360 10*3/uL (ref 150.0–400.0)
RBC: 4.02 Mil/uL (ref 3.87–5.11)
RDW: 18.4 % — ABNORMAL HIGH (ref 11.5–15.5)
WBC: 6.7 10*3/uL (ref 4.0–10.5)

## 2023-07-05 LAB — LIPID PANEL
Cholesterol: 122 mg/dL (ref 0–200)
HDL: 41.1 mg/dL (ref >39.00)
LDL Cholesterol: 52 mg/dL (ref 0–99)
NonHDL: 80.51
Total CHOL/HDL Ratio: 3
Triglycerides: 141 mg/dL (ref 0.0–149.0)
VLDL: 28.2 mg/dL (ref 0.0–40.0)

## 2023-07-09 ENCOUNTER — Other Ambulatory Visit: Payer: Self-pay | Admitting: Family Medicine

## 2023-07-09 ENCOUNTER — Other Ambulatory Visit: Payer: Self-pay

## 2023-07-09 ENCOUNTER — Telehealth: Payer: Self-pay

## 2023-07-09 DIAGNOSIS — M79672 Pain in left foot: Secondary | ICD-10-CM

## 2023-07-09 DIAGNOSIS — D509 Iron deficiency anemia, unspecified: Secondary | ICD-10-CM

## 2023-07-10 ENCOUNTER — Telehealth: Payer: Self-pay | Admitting: Allergy

## 2023-07-10 MED ORDER — AMOXICILLIN 250 MG/5ML PO SUSR: ORAL | 0 refills | Status: DC

## 2023-07-10 NOTE — Telephone Encounter (Signed)
 Provider ordered

## 2023-07-10 NOTE — Telephone Encounter (Signed)
Called pt lvm Dr.Blyth has ordered Xray  She can call to make appt

## 2023-07-10 NOTE — Telephone Encounter (Signed)
I called the patient and she plans to pick up the amoxicillin for her challenge on Thursday. She verbalized understanding she is to keep it in the fridge until her appointment.

## 2023-07-10 NOTE — Telephone Encounter (Signed)
Please call patient and let her know that amoxicillin Rx was sent in to [facility] in [place].  Do NOT take at home. Bring to drug challenge on Thursday. Keep refrigerated.  Thank you.

## 2023-07-11 ENCOUNTER — Ambulatory Visit (INDEPENDENT_AMBULATORY_CARE_PROVIDER_SITE_OTHER): Payer: Medicare Other

## 2023-07-11 ENCOUNTER — Encounter: Payer: Self-pay | Admitting: Nurse Practitioner

## 2023-07-11 ENCOUNTER — Ambulatory Visit: Payer: Medicare Other | Admitting: Nurse Practitioner

## 2023-07-11 VITALS — BP 120/68 | HR 73 | Ht 65.0 in | Wt 124.4 lb

## 2023-07-11 DIAGNOSIS — S93409A Sprain of unspecified ligament of unspecified ankle, initial encounter: Secondary | ICD-10-CM | POA: Diagnosis not present

## 2023-07-11 DIAGNOSIS — N183 Chronic kidney disease, stage 3 unspecified: Secondary | ICD-10-CM | POA: Diagnosis not present

## 2023-07-11 DIAGNOSIS — J440 Chronic obstructive pulmonary disease with acute lower respiratory infection: Secondary | ICD-10-CM

## 2023-07-11 DIAGNOSIS — D509 Iron deficiency anemia, unspecified: Secondary | ICD-10-CM | POA: Diagnosis not present

## 2023-07-11 DIAGNOSIS — J189 Pneumonia, unspecified organism: Secondary | ICD-10-CM | POA: Diagnosis not present

## 2023-07-11 DIAGNOSIS — R058 Other specified cough: Secondary | ICD-10-CM | POA: Diagnosis not present

## 2023-07-11 DIAGNOSIS — J209 Acute bronchitis, unspecified: Secondary | ICD-10-CM

## 2023-07-11 DIAGNOSIS — J449 Chronic obstructive pulmonary disease, unspecified: Secondary | ICD-10-CM | POA: Diagnosis not present

## 2023-07-11 DIAGNOSIS — J383 Other diseases of vocal cords: Secondary | ICD-10-CM | POA: Diagnosis not present

## 2023-07-11 MED ORDER — METHYLPREDNISOLONE ACETATE 80 MG/ML IJ SUSP
80.0000 mg | Freq: Once | INTRAMUSCULAR | Status: AC
Start: 2023-07-11 — End: 2023-07-11
  Administered 2023-07-11: 80 mg via INTRAMUSCULAR

## 2023-07-11 MED ORDER — CEFDINIR 300 MG PO CAPS
300.0000 mg | ORAL_CAPSULE | Freq: Two times a day (BID) | ORAL | 0 refills | Status: AC
Start: 2023-07-11 — End: 2023-07-21

## 2023-07-11 MED ORDER — PREDNISONE 10 MG PO TABS
ORAL_TABLET | ORAL | 0 refills | Status: DC
Start: 2023-07-11 — End: 2023-07-24

## 2023-07-11 NOTE — Assessment & Plan Note (Signed)
Persistent infectious symptoms. See above plan. Strict return/ED precautions.

## 2023-07-11 NOTE — Assessment & Plan Note (Signed)
Unresolved exacerbation. VS stable on room air and non-toxic appearing. We will treat her with empiric cefdinir 10 day course. She has tolerated this in the past despite her allergy to pcn. Depo 80 mg inj x 1 and prednisone taper. Mucociliary clearance and maximize bronchodilator therapies. She will start maintenance regimen with ICS/LABA therapy. Educated on proper use and side effect profile. Reviewed necessity of scheduled inhaler medications at this point. Repeat CXR today. If no improvement, she will need sputum cultures and may need repeat CT imaging. Recent CBC without leukocytosis and elevated peripheral eosinophils. Action plan in place  Patient Instructions  Continue Albuterol inhaler 2 puffs or 3 mL every 6 hours as needed for shortness of breath or wheezing. Use neb treatments at least twice a day then use flutter valve ten times daily  Continue flonase nasal spray 2 sprays each nostril daily Stop flovent inhaler. Start Advair 1 puff Twice daily. Brush tongue and rinse mouth afterwards Continue claritin 1 tab daily Continue protonix 1 tab Twice daily   Cefdinir 1 tab Twice daily for 10 days. Take with food.  Prednisone taper. 4 tabs for 2 days, then 3 tabs for 2 days, 2 tabs for 2 days, then 1 tab for 2 days, then stop. Take in AM with food Guaifenesin 600 mg Twice daily for cough/chest congestion  Chest x ray today  Follow up in 2 weeks with Dr. Delton Coombes or Philis Nettle. Ok to double book. If symptoms do not improve or worsen, please contact office for sooner follow up or seek emergency care.

## 2023-07-11 NOTE — Progress Notes (Signed)
@Patient  ID: [name], female    DOB: April 16, 1946, 77 y.o.   MRN: [id number]  Chief Complaint  Patient presents with   Hospitalization Follow-up    Patient was in the hospital for Pneumonia-has been wheezing some  Coughing and SOB has improved     Referring provider: Bradd Canary, MD  HPI: 77 year old female, former smoker followed for asthma and allergies. She is a patient of Dr. Kavin Leech and last seen in office 12/05/2022. Past medical history significant HTN, GERD, vocal cord hemangioma, DDD, anxiety, breast cancer, HLD, insomnia, RLS, sjogren's disease on Plaquenil.   TEST/EVENTS:  08/18/2021 PFT: FVC 50, FEV1 45, ratio 63, TLC 93, DLCOcor 73. Severe obstruction with reversibility and moderate diffusing defect  06/14/2023 CTA chest: atherosclerosis. Small hiatal hernia. Mild clustered nodularity in the medial left lung.  07/05/2023 eos 400   12/05/2022: OV with Dr. Delton Coombes. Last seen 08/2022. Persistent symptoms so was started on Flovent. Improved today. Able to stop flovent. Doing well until she was exposed to air freshener so restarted the flovent again. On good allergy regimen. Would like to be off of scheduled inhaled medicine if possible. Ok to retry off and avoid triggers. If persistent symptoms remain, will consider imaging.  07/11/2023: Today - acute Patient presents today for acute visit. She had viral symptoms in the beginning of June and was treated for acute bronchitis by her PCP with steroid injection and prednisone burst 6/7. Flu and COVID testing at the time was negative. She went back to her PCP office on 6/14 with return of cough, dyspnea, and wheezing. CXR without acute process. She was treated with azithromycin for atypical pathogen. She failed to improve and on 6/20, felt like she couldn't breath and had low O2 levels down to 82%. She went to the ED for further evaluation. She had a positive d dimer so she had a CTA which was negative for PE but revealed mild clustered  nodularity in the medial left lung base. She was treated with rocephin x1, steroid injection, and advised to start high dose Advair.  Today, she tells me that the congestion in her chest is some better but her cough still hasn't improved. She's producing brown phlegm and sometimes it hurts to cough. She is still feeling short winded and noticing wheezing at times. She feels very run down. Can't seem to get her energy back. She denies any fevers, chills, night sweats, hemoptysis, leg swelling, orthopnea, calf pain. Eating and drinking well. She has not noticed any low O2 levels. She never started the Advair she was prescribed. She did feel better for a few days after her ED visit but she was also worried about starting a new inhaled medication without discussing with [place]. She has not been using her rescue inhaler. Has used her flovent sporadically. No OTC medications.   Allergies  Allergen Reactions   Clindamycin Hcl Shortness Of Breath and Rash   Penicillins Anaphylaxis   Rosuvastatin Anaphylaxis   Baclofen Other (See Comments) and Rash   Lincomycin Other (See Comments)   Prednisone Rash    Other reaction(s): Rash, Hives - can tolerate if she takes with benadryl    Codeine Hives and Other (See Comments)    headache Other reaction(s): Headache, Nausea, headache   Doxycycline     Skin rash   Erythromycin Hives   Haemophilus Influenzae Other (See Comments)    Local reaction at the site Local reaction at the site   Haemophilus Influenzae Vaccines Other (  See Comments)    Local reaction at site   Latex Hives   Levofloxacin     Other reaction(s): sick   Other Other (See Comments)    Father had malignant hyperthermia   Pentazocine Lactate Other (See Comments)    HALLUCINATION   Pneumococcal Vaccine Polyvalent Hives, Swelling and Other (See Comments)    REACTION: redness, swelling, and hives at injection site   Tamoxifen Nausea And Vomiting and Other (See Comments)    HEADACHE Other  reaction(s): Headache, Nausea   Fluzone [Influenza Virus Vaccine] Hives    Local reaction at the site    Immunization History  Administered Date(s) Administered   Influenza Split 10/10/2011, 09/24/2012   Influenza Whole 10/18/2010   Influenza,inj,Quad PF,6+ Mos 10/13/2013, 09/25/2014   PFIZER(Purple Top)SARS-COV-2 Vaccination 01/29/2020, 02/23/2020, 09/18/2020, 07/25/2021   Pneumococcal Polysaccharide-23 01/27/2011   Tdap 10/21/2013, 06/01/2018   Zoster Recombinant(Shingrix) 07/14/2019, 10/16/2019    Past Medical History:  Diagnosis Date   Allergy    Anemia    Anxiety    past hx    Arthritis    Asthma    Atrial flutter (HCC)    past history- not current   Breast cancer (HCC)    CAD (coronary artery disease) CARDIOLOGIST--  DR Clifton James   mild non-obstructive cad   Cancer (HCC)    right   Cataract    bilaterally removed    Chronic constipation    Chronic kidney disease    stage 3 ckd, interstitial cystitis   Concussion    x 3   COPD (chronic obstructive pulmonary disease) (HCC)    Depression    past hx    Dyspnea    Dysrhythmia    Family history of adverse reaction to anesthesia    Family history of malignant hyperthermia    father had this   Fibromyalgia    Frequency of urination    GERD (gastroesophageal reflux disease)    H/O hiatal hernia    History of basal cell carcinoma excision    X2   History of breast cancer ONCOLOGIST-- DR Darnelle Catalan---  NO RECURRANCE   DX 07/2012;  LOW GRADE DCIS  ER+PR+  ----  S/P RIGHT LUMPECTOMY WITH NEGATIVE MARGINS/   RADIATION ENDED 11/2012   History of chronic bronchitis    History of colonic polyps    History of tachycardia    CONTROLLED  WITH ATENOLOL   Hyperlipidemia    Hypertension    Neuromuscular disorder (HCC)    fibromyalgia   Neuropathy    Osteoporosis 01/2019   T score -2.2 stable/improved from prior study   Pelvic pain    Personal history of radiation therapy    Pneumonia    S/P radiation therapy 11/12/12 -  12/05/12   right Breast   Sepsis (HCC) 2014   from UTI    Sinus headache    Sjogren's disease (HCC)    Urgency of urination     Tobacco History: Social History   Tobacco Use  Smoking Status Former   Current packs/day: 0.00   Average packs/day: 1 pack/day for 15.0 years (15.0 ttl pk-yrs)   Types: Cigarettes   Start date: 05/27/1990   Quit date: 05/27/2005   Years since quitting: 18.1  Smokeless Tobacco Never   Counseling given: Not Answered   Outpatient Medications Prior to Visit  Medication Sig Dispense Refill   acetaminophen (TYLENOL) 500 MG tablet Take 1,000 mg by mouth every 6 (six) hours as needed for mild pain or headache.  albuterol (VENTOLIN HFA) 108 (90 Base) MCG/ACT inhaler Inhale 2 puffs into the lungs every 4 (four) hours as needed for wheezing or shortness of breath (coughing fits). 18 g 1   amLODipine (NORVASC) 5 MG tablet Take 1 tablet (5 mg total) by mouth daily. 90 tablet 3   aspirin EC 81 MG tablet Take 1 tablet (81 mg total) by mouth daily. 90 tablet 3   atenolol (TENORMIN) 25 MG tablet Take 1 tablet (25 mg total) by mouth 2 (two) times daily. Take 1 tablet by mouth 2 time a day 180 tablet 3   CALCIUM PO Take 500 mg by mouth daily.     carboxymethylcellul-glycerin (OPTIVE) 0.5-0.9 % ophthalmic solution Place 1 drop into both eyes daily as needed for dry eyes.     Cholecalciferol (VITAMIN D3) 50 MCG (2000 UT) CAPS Take 2,000 Units by mouth daily.     cyanocobalamin (VITAMIN B12) 1000 MCG tablet Take 1,000 mg by mouth daily with breakfast.     diclofenac Sodium (VOLTAREN) 1 % GEL Apply 2 g topically daily as needed (Back pain).     diphenhydrAMINE (BENADRYL) 25 mg capsule Take 25 mg by mouth every 6 (six) hours as needed for itching.     Docusate Sodium (DSS) 100 MG CAPS Take 100 mg by mouth daily as needed (Constipation).     DULoxetine (CYMBALTA) 30 MG capsule Take 1 capsule (30 mg total) by mouth daily. 30 capsule 1   DULoxetine (CYMBALTA) 60 MG capsule Take  1 capsule by mouth twice daily 180 capsule 0   EPINEPHrine 0.3 mg/0.3 mL IJ SOAJ injection Inject 0.3 mg into the muscle as needed for anaphylaxis. 2 each 1   famotidine (PEPCID) 20 MG tablet Take 1 tablet (20 mg total) by mouth 2 (two) times daily as needed for heartburn or indigestion. 60 tablet 1   fluticasone (FLONASE) 50 MCG/ACT nasal spray Use 2 spray(s) in each nostril once daily (Patient taking differently: Place 2 sprays into both nostrils daily as needed for allergies or rhinitis.) 16 g 5   fluticasone (FLOVENT HFA) 110 MCG/ACT inhaler Inhale 2 puffs into the lungs 2 (two) times daily. (Patient taking differently: Inhale 2 puffs into the lungs daily as needed (Short of breath).) 1 each 5   fluticasone-salmeterol (ADVAIR) 250-50 MCG/ACT AEPB Inhale 1 puff into the lungs in the morning and at bedtime. 1 each 2   folic acid (FOLVITE) 1 MG tablet Take 1 tablet (1 mg total) by mouth daily.     furosemide (LASIX) 20 MG tablet Take 1 tablet (20 mg total) by mouth as needed for edema. 90 tablet 3   gabapentin (NEURONTIN) 300 MG capsule Take 300-900 mg by mouth See admin instructions. Take 600 mg in the morning 300 mg at lunch time and 900 mg at bedtime     hydroxychloroquine (PLAQUENIL) 200 MG tablet Take 200 mg by mouth daily.     hydrOXYzine (ATARAX) 10 MG tablet Take 1 tablet (10 mg total) by mouth 3 (three) times daily as needed. 30 tablet 1   linaclotide (LINZESS) 145 MCG CAPS capsule Take 1 capsule (145 mcg total) by mouth daily as needed. Lot: [id number]  Exp:11-2023 12 capsule 0   loratadine (CLARITIN) 10 MG tablet Take 10 mg by mouth daily as needed for rhinitis.     Multiple Vitamins-Minerals (MULTIVITAMIN WITH MINERALS) tablet Take 1 tablet by mouth daily. (Patient taking differently: Take 1 tablet by mouth daily. 50+)     nitroGLYCERIN (NITROSTAT) 0.4 MG  SL tablet Place 1 tablet (0.4 mg total) under the tongue every 5 (five) minutes as needed for chest pain. 25 tablet 1   nystatin cream  (MYCOSTATIN) Apply 1 application topically 2 (two) times daily. (Patient taking differently: Apply 1 application  topically daily as needed (vaginal yeast).) 30 g 2   pantoprazole (PROTONIX) 40 MG tablet Take 1 tablet (40 mg total) by mouth 2 (two) times daily. 90 tablet 1   pentosan polysulfate (ELMIRON) 100 MG capsule Take 1 capsule (100 mg total) by mouth 3 (three) times daily. Reported on 01/05/2016 (Patient taking differently: Take 100 mg by mouth daily as needed (burning and pain). Reported on 01/05/2016) 90 capsule 11   polyethylene glycol (MIRALAX) 17 g packet Take 17 g by mouth 2 (two) times daily. 14 each 0   REPATHA SURECLICK 140 MG/ML SOAJ INJECT 140 MG INTO THE SKIN EVERY 14 DAYS 6 mL 0   sucralfate (CARAFATE) 1 GM/10ML suspension Take 10 mLs (1 g total) by mouth 4 (four) times daily -  with meals and at bedtime. (Patient taking differently: Take 1 g by mouth 2 (two) times daily.) 420 mL 1   traMADol (ULTRAM) 50 MG tablet Take 25 mg by mouth 2 (two) times daily.     traZODone (DESYREL) 100 MG tablet TAKE 1 TABLET BY MOUTH AT BEDTIME 90 tablet 0   Vitamin D, Ergocalciferol, (DRISDOL) 1.25 MG (50000 UNIT) CAPS capsule Take 1 capsule (50,000 Units total) by mouth every 7 (seven) days. 12x Weeks 5 capsule 3   amoxicillin (AMOXIL) 250 MG/5ML suspension Do NOT take at home. Bring to Dr. Elmyra Ricks office for drug challenge. 80 mL 0   No facility-administered medications prior to visit.     Review of Systems:   Constitutional: No weight loss or gain, night sweats, fevers, chills, or lassitude. +fatigue  HEENT: No headaches, difficulty swallowing, tooth/dental problems, or sore throat. No sneezing, itching, ear ache, nasal congestion, or post nasal drip CV:  No chest pain, orthopnea, PND, swelling in lower extremities, anasarca, dizziness, palpitations, syncope Resp: +shortness of breath with exertion; productive cough; wheezing. No hemoptysis.  No chest wall deformity GI:  No heartburn,  indigestion, abdominal pain, nausea, vomiting, diarrhea, change in bowel habits, loss of appetite, bloody stools.  GU: No dysuria, change in color of urine, urgency or frequency.   Skin: No rash, lesions, ulcerations MSK:  No joint pain or swelling.   Neuro: No dizziness or lightheadedness.  Psych: No depression or anxiety. Mood stable.     Physical Exam:  BP 120/68 (BP Location: Left Arm, Patient Position: Sitting, Cuff Size: Normal)   Pulse 73   Ht 5\' 5"  (1.651 m)   Wt 124 lb 6.4 oz (56.4 kg)   SpO2 95%   BMI 20.70 kg/m   GEN: Pleasant, interactive, well-kempt; in no acute distress. HEENT:  Normocephalic and atraumatic. PERRLA. Sclera white. Nasal turbinates pink, moist and patent bilaterally. No rhinorrhea present. Oropharynx pink and moist, without exudate or edema. No lesions, ulcerations, or postnasal drip.  NECK:  Supple w/ fair ROM. No JVD present. Normal carotid impulses w/o bruits. Thyroid symmetrical with no goiter or nodules palpated. No lymphadenopathy.   CV: RRR, no m/r/g, no peripheral edema. Pulses intact, +2 bilaterally. No cyanosis, pallor or clubbing. PULMONARY:  Unlabored, regular breathing. Diminished bibasilar airflow and end expiratory wheeze right posterior. No accessory muscle use.  GI: BS present and normoactive. Soft, non-tender to palpation. No organomegaly or masses detected. MSK: No erythema, warmth or  tenderness. Cap refil <2 sec all extrem. No deformities or joint swelling noted.  Neuro: A/Ox3. No focal deficits noted.   Skin: Warm, no lesions or rashe Psych: Normal affect and behavior. Judgement and thought content appropriate.     Lab Results:  CBC    Component Value Date/Time   WBC 6.7 07/05/2023 0904   RBC 4.02 07/05/2023 0904   HGB 11.0 (L) 07/05/2023 0904   HGB 11.8 05/25/2023 1231   HGB 11.3 (L) 04/20/2015 1050   HCT 35.6 (L) 07/05/2023 0904   HCT 36.3 05/25/2023 1231   HCT 36.0 04/20/2015 1050   PLT 360.0 07/05/2023 0904   PLT 348  05/25/2023 1231   MCV 88.7 07/05/2023 0904   MCV 85 05/25/2023 1231   MCV 90.9 04/20/2015 1050   MCH 27.6 06/14/2023 1643   MCHC 31.0 07/05/2023 0904   RDW 18.4 (H) 07/05/2023 0904   RDW 15.8 (H) 05/25/2023 1231   RDW 15.9 (H) 04/20/2015 1050   LYMPHSABS 1.3 07/05/2023 0904   LYMPHSABS 2.1 05/25/2023 1231   LYMPHSABS 1.3 04/20/2015 1050   MONOABS 0.5 07/05/2023 0904   MONOABS 0.5 04/20/2015 1050   EOSABS 0.4 07/05/2023 0904   EOSABS 0.5 (H) 05/25/2023 1231   BASOSABS 0.1 07/05/2023 0904   BASOSABS 0.1 05/25/2023 1231   BASOSABS 0.0 04/20/2015 1050    BMET    Component Value Date/Time   NA 135 06/14/2023 1643   NA 138 05/25/2023 1231   NA 140 04/20/2015 1050   K 3.6 06/14/2023 1643   K 3.9 04/20/2015 1050   CL 100 06/14/2023 1643   CL 103 11/12/2012 1549   CO2 25 06/14/2023 1643   CO2 26 04/20/2015 1050   GLUCOSE 147 (H) 06/14/2023 1643   GLUCOSE 82 04/20/2015 1050   GLUCOSE 103 (H) 11/12/2012 1549   BUN 17 06/14/2023 1643   BUN 13 05/25/2023 1231   BUN 13.1 04/20/2015 1050   CREATININE 1.17 (H) 06/14/2023 1643   CREATININE 1.1 04/20/2015 1050   CALCIUM 8.3 (L) 06/14/2023 1643   CALCIUM 8.9 04/20/2015 1050   GFRNONAA 48 (L) 06/14/2023 1643   GFRNONAA 51 (L) 07/27/2014 1227   GFRAA 66 09/03/2018 1114   GFRAA 59 (L) 07/27/2014 1227    BNP No results found for: "BNP"   Imaging:  DG Chest 2 View  Result Date: 07/11/2023 CLINICAL DATA:  Productive cough, concern for pneumonia EXAM: CHEST - 2 VIEW COMPARISON:  06/14/2023 FINDINGS: Cardiac and mediastinal contours are within normal limits. Hyperinflated lungs with mild biapical scarring. No focal pulmonary opacity. No pleural effusion or pneumothorax. No acute osseous abnormality. IMPRESSION: No acute cardiopulmonary process.  COPD. Electronically Signed   By: Wiliam Ke M.D.   On: 07/11/2023 12:47   MR BRAIN WO CONTRAST  Result Date: 06/20/2023 CLINICAL DATA:  Provided history: Memory impairment. Additional  history provided by the scanning technologist: Confusion, depression, difficulty walking, speech difficulty, prior falls. EXAM: MRI HEAD WITHOUT CONTRAST TECHNIQUE: Multiplanar, multiecho pulse sequences of the brain and surrounding structures were obtained without intravenous contrast. COMPARISON:  Brain MRI 10/10/2017. FINDINGS: Brain: Mild generalized cerebral atrophy. Multifocal T2 FLAIR hyperintense signal abnormality within the cerebral white matter, nonspecific but compatible with mild chronic small vessel ischemic disease. There is no acute infarct. No evidence of an intracranial mass. No chronic intracranial blood products. No extra-axial fluid collection. No midline shift. Vascular: Maintained flow voids within the proximal large arterial vessels. Skull and upper cervical spine: No focal suspicious marrow lesion. Sinuses/Orbits: No  mass or acute finding within the imaged orbits. Prior bilateral ocular lens replacement. No significant paranasal sinus disease. IMPRESSION: 1.  No evidence of an acute intracranial abnormality. 2. Mild chronic small vessel ischemic changes within the cerebral white matter, slightly progressed from the prior brain MRI of 10/10/2017. 3. Mild generalized cerebral atrophy. Electronically Signed   By: Jackey Loge D.O.   On: 06/20/2023 16:21   CT Angio Chest PE W and/or Wo Contrast  Result Date: 06/14/2023 CLINICAL DATA:  Shortness of breath EXAM: CT ANGIOGRAPHY CHEST WITH CONTRAST TECHNIQUE: Multidetector CT imaging of the chest was performed using the standard protocol during bolus administration of intravenous contrast. Multiplanar CT image reconstructions and MIPs were obtained to evaluate the vascular anatomy. RADIATION DOSE REDUCTION: This exam was performed according to the departmental dose-optimization program which includes automated exposure control, adjustment of the mA and/or kV according to patient size and/or use of iterative reconstruction technique. CONTRAST:   75mL OMNIPAQUE IOHEXOL 350 MG/ML SOLN COMPARISON:  Chest x-ray 06/14/2023 chest CT 07/02/2020 FINDINGS: Cardiovascular: Satisfactory opacification of the pulmonary arteries to the segmental level. No evidence of pulmonary embolism. Normal heart size. No pericardial effusion. Mild aortic atherosclerosis. No aneurysm. Coronary vascular calcification Mediastinum/Nodes: Small hiatal hernia. Midline trachea. No thyroid mass. No suspicious lymph nodes Lungs/Pleura: No consolidation, pleural effusion, or pneumothorax. Mild clustered nodularity in the medial left lung base. Upper Abdomen: No acute abnormality. Musculoskeletal: No chest wall abnormality. No acute or significant osseous findings. Review of the MIP images confirms the above findings. IMPRESSION: 1. Negative for acute pulmonary embolus. 2. Mild clustered nodularity in the medial left lung base, consistent with infectious or inflammatory bronchiolitis. 3. Aortic atherosclerosis. Aortic Atherosclerosis (ICD10-I70.0). Electronically Signed   By: Jasmine Pang M.D.   On: 06/14/2023 18:03   DG Chest 2 View  Result Date: 06/14/2023 CLINICAL DATA:  Chest tightness. EXAM: CHEST - 2 VIEW COMPARISON:  Chest x-ray dated June 08, 2023. FINDINGS: The heart size and mediastinal contours are within normal limits. Normal pulmonary vascularity. The lungs remain hyperinflated with mild biapical pleuroparenchymal scarring. No focal consolidation, pleural effusion, or pneumothorax. No acute osseous abnormality. IMPRESSION: 1. No acute cardiopulmonary disease. COPD. Electronically Signed   By: Obie Dredge M.D.   On: 06/14/2023 16:58    ipratropium-albuterol (DUONEB) 0.5-2.5 (3) MG/3ML nebulizer solution 3 mL     Date Action Dose Route User   Discharged on 06/14/2023   Admitted on 06/14/2023   06/07/2023 1140 Given 3 mL Nebulization Cheek, Yolanda R, CMA      methylPREDNISolone acetate (DEPO-MEDROL) injection 80 mg     Date Action Dose Route User   07/11/2023 1158  Given 80 mg Intramuscular (Right Upper Outer Quadrant) Christen Butter, CMA      methylPREDNISolone sodium succinate (SOLU-MEDROL) 40 mg/mL injection 40 mg     Date Action Dose Route User   Discharged on 06/14/2023   Admitted on 06/14/2023   06/07/2023 1140 Given 40 mg Intramuscular (Left Ventrogluteal) Cheek, Yolanda R, CMA          Latest Ref Rng & Units 08/18/2021    8:39 AM  PFT Results  FVC-Pre L 1.42   FVC-Predicted Pre % 50   FVC-Post L 1.94   FVC-Predicted Post % 68   Pre FEV1/FVC % % 68   Post FEV1/FCV % % 63   FEV1-Pre L 0.96   FEV1-Predicted Pre % 45   FEV1-Post L 1.22   DLCO uncorrected ml/min/mmHg 13.72   DLCO UNC% %  70   DLCO corrected ml/min/mmHg 14.33   DLCO COR %Predicted % 73   DLVA Predicted % 115   TLC L 4.80   TLC % Predicted % 93   RV % Predicted % 127     No results found for: "NITRICOXIDE"      Assessment & Plan:   Acute bronchitis with COPD (HCC) Unresolved exacerbation. VS stable on room air and non-toxic appearing. We will treat her with empiric cefdinir 10 day course. She has tolerated this in the past despite her allergy to pcn. Depo 80 mg inj x 1 and prednisone taper. Mucociliary clearance and maximize bronchodilator therapies. She will start maintenance regimen with ICS/LABA therapy. Educated on proper use and side effect profile. Reviewed necessity of scheduled inhaler medications at this point. Repeat CXR today. If no improvement, she will need sputum cultures and may need repeat CT imaging. Recent CBC without leukocytosis and elevated peripheral eosinophils. Action plan in place  Patient Instructions  Continue Albuterol inhaler 2 puffs or 3 mL every 6 hours as needed for shortness of breath or wheezing. Use neb treatments at least twice a day then use flutter valve ten times daily  Continue flonase nasal spray 2 sprays each nostril daily Stop flovent inhaler. Start Advair 1 puff Twice daily. Brush tongue and rinse mouth  afterwards Continue claritin 1 tab daily Continue protonix 1 tab Twice daily   Cefdinir 1 tab Twice daily for 10 days. Take with food.  Prednisone taper. 4 tabs for 2 days, then 3 tabs for 2 days, 2 tabs for 2 days, then 1 tab for 2 days, then stop. Take in AM with food Guaifenesin 600 mg Twice daily for cough/chest congestion  Chest x ray today  Follow up in 2 weeks with Dr. Delton Coombes or Philis Nettle. Ok to double book. If symptoms do not improve or worsen, please contact office for sooner follow up or seek emergency care.    CAP (community acquired pneumonia) Persistent infectious symptoms. See above plan. Strict return/ED precautions.    I spent 42 minutes of dedicated to the care of this patient on the date of this encounter to include pre-visit review of records, face-to-face time with the patient discussing conditions above, post visit ordering of testing, clinical documentation with the electronic health record, making appropriate referrals as documented, and communicating necessary findings to members of the patients care team.  Noemi Chapel, NP 07/11/2023  Pt aware and understands NP's role.

## 2023-07-11 NOTE — Patient Instructions (Addendum)
Continue Albuterol inhaler 2 puffs or 3 mL every 6 hours as needed for shortness of breath or wheezing. Use neb treatments at least twice a day then use flutter valve ten times daily  Continue flonase nasal spray 2 sprays each nostril daily Stop flovent inhaler. Start Advair 1 puff Twice daily. Brush tongue and rinse mouth afterwards Continue claritin 1 tab daily Continue protonix 1 tab Twice daily   Cefdinir 1 tab Twice daily for 10 days. Take with food.  Prednisone taper. 4 tabs for 2 days, then 3 tabs for 2 days, 2 tabs for 2 days, then 1 tab for 2 days, then stop. Take in AM with food Guaifenesin 600 mg Twice daily for cough/chest congestion  Chest x ray today  Follow up in 2 weeks with Dr. Delton Coombes or Philis Nettle. Ok to double book. If symptoms do not improve or worsen, please contact office for sooner follow up or seek emergency care.

## 2023-07-11 NOTE — Telephone Encounter (Signed)
Patient cx challenge appointment for tomorrow. Patient does apologize, she went and saw her pulmonologist due to breathing issues & was given an injection today. Her pulmonologist advised her to cancel her appointment as she is not feeling well. Patient states she has had to cancel this a few times and does feel bad about cancelling.   Patient was also unsure what to do about prescription as pharmacy has it. Advised patient to reach out to pharmacy and advise them of cancelled appointment, and to ask them to hold on to prescription until she reschedules and is able to pick it up. Patient verbalized understanding.   Advised patient Dr. Selena Batten does request for patients to be in good health and to not have any injections recently. Read out to patient Dr. Elmyra Ricks note  "You must be off antihistamines for 3-5 days before. Must be in good health and not ill. No vaccines/injections/antibiotics within the past 7 days."   Advised patient I would go ahead an cancel appointment. Patient verbalized understanding and expressed her gratitude. Patient will call back to reschedule once she sees her pulmonologist again (she has a follow up scheduled in 2 weeks).

## 2023-07-11 NOTE — Progress Notes (Deleted)
Follow Up Note  RE: Sheri MCERLEAN MRN: [id number] DOB: December 22, 1946 Date of Office Visit: 07/12/2023  Referring provider: Bradd Canary, MD Primary care provider: Bradd Canary, MD  Chief Complaint: No chief complaint on file.  Assessment and Plan: [name] is a 77 y.o. female with: No problem-specific Assessment & Plan notes found for this encounter.  No follow-ups on file.  Plan: Challenge drug: *** Challenge as per protocol: {Blank single:19197::"Passed","Failed"} Total time: ***  For next 24 hours monitor for hives, swelling, shortness of breath and dizziness. If you see these symptoms, use Benadryl for mild symptoms and for more severe symptoms go to ER/urgent care or call 911.  If no adverse symptoms in the next 24 hours then will take off drug from allergy list.  History of Present Illness: I had the pleasure of seeing [name] for a follow up visit at the [facility] on 07/11/2023. She is a 77 y.o. female, who is being followed for multiple drug allergies, recurrent infections, asthma, allergic rhinitis, adverse food reaction, local reaction to hymenoptera sting and allergic reactions. Her previous allergy office visit was on 05/08/2023 with Dr. Selena Batten. Today she is here for penicillin drug testing and challenge.   History of Reaction: Multiple drug allergies History of frequent sinopulmonary infections with multiple antibiotic reactions. Tolerates omnicef and azithromycin. Sometimes has random rashes without taking any antibiotics. Medical history significant for breast cancer and stage 3 kidney disease. Penicillin - anaphylactic reaction in 1978 (airway tightness, hives, itching requiring epi and ER visit). Previously tolerated with no issues. Clindamycin - rash; lincomycin - rash; prednisone - hives but no issues if she takes it with benadryl; doxycycline - possibly rash; erythromycin - possibly rash; flu/pneumonia vaccine - localized reactions; latex -  rash; levofloxacin - not sure.  Today's skin prick and intradermal testing today to penicillin-G, PrePen were negative. Continue to avoid drugs on the allergy list. Schedule for amoxicillin drug challenge next.   Recurrent infections Keep track of infections and antibiotics use. If persistent will get bloodwork next to look at immune system.    Moderate persistent asthma without complication Follows with pulmonology and now only using Flovent and albuterol prn. Today's spirometry showed: mixed obstructive and restrictive disease with 30% improvement in FEV1 post bronchodilator treatment. Clinically feeling improved.  Daily controller medication(s): start Flovent 2 puffs twice a day with spacer and rinse mouth afterwards. This is the ORANGE inhaler.  Spacer given and demonstrated proper use with inhaler. Patient understood technique and all questions/concerned were addressed.  May use albuterol rescue inhaler 2 puffs every 4 to 6 hours as needed for shortness of breath, chest tightness, coughing, and wheezing.  Monitor frequency of use - if you need to use it more than twice per week on a consistent basis let us know.    Other allergic rhinitis Takes Claritin daily which helps with PND. No prior allergy testing. Had sinus surgery x 2.  Get bloodwork. Use over the counter antihistamines such as Zyrtec (cetirizine), Claritin (loratadine), Allegra (fexofenadine), or Xyzal (levocetirizine) daily as needed. May switch antihistamines every few months.   Other adverse food reactions, not elsewhere classified, subsequent encounter Milk causes bloating and eggs make her nauseous. Keep a food diary. Get bloodwork. Limit egg and dairy.   Local reaction to hymenoptera sting Large localized reactions in the past.  Continue to avoid. Get bloodwork.   Allergic reaction Keep track of episodes. Get bloodwork I have prescribed epinephrine injectable  device and demonstrated proper use. For  mild symptoms you can take over the counter antihistamines such as Benadryl 1-2 tablets = 25-50mg  and monitor symptoms closely. If symptoms worsen or if you have severe symptoms including breathing issues, throat closure, significant swelling, whole body hives, severe diarrhea and vomiting, lightheadedness then inject epinephrine and seek immediate medical care afterwards. Emergency action plan given.   Return for Drug challenge.  Interval History: Patient has not been ill, she has not had any accidental exposures to the culprit medication.   Recent/Current History: Pulmonary disease: {Blank single:19197::"yes","no"} Cardiac disease: {Blank single:19197::"yes","no"} Respiratory infection: {Blank single:19197::"yes","no"} Rash: {Blank single:19197::"yes","no"} Itch: {Blank single:19197::"yes","no"} Swelling: {Blank single:19197::"yes","no"} Cough: {Blank single:19197::"yes","no"} Shortness of breath: {Blank single:19197::"yes","no"} Runny/stuffy nose: {Blank single:19197::"yes","no"} Itchy eyes: {Blank single:19197::"yes","no"} Beta-blocker use: {Blank single:19197::"yes","no"}  Patient/guardian was informed of the test procedure with verbalized understanding of the risk of anaphylaxis. Consent was signed.   Last antihistamine use: *** Last beta-blocker use: ***  Medication List:  Current Outpatient Medications  Medication Sig Dispense Refill   acetaminophen (TYLENOL) 500 MG tablet Take 1,000 mg by mouth every 6 (six) hours as needed for mild pain or headache.      albuterol (VENTOLIN HFA) 108 (90 Base) MCG/ACT inhaler Inhale 2 puffs into the lungs every 4 (four) hours as needed for wheezing or shortness of breath (coughing fits). 18 g 1   amLODipine (NORVASC) 5 MG tablet Take 1 tablet (5 mg total) by mouth daily. 90 tablet 3   amoxicillin (AMOXIL) 250 MG/5ML suspension Do NOT take at home. Bring to Dr. Elmyra Ricks office for drug challenge. 80 mL 0   aspirin EC 81 MG tablet Take 1 tablet  (81 mg total) by mouth daily. 90 tablet 3   atenolol (TENORMIN) 25 MG tablet Take 1 tablet (25 mg total) by mouth 2 (two) times daily. Take 1 tablet by mouth 2 time a day 180 tablet 3   CALCIUM PO Take 500 mg by mouth daily.     carboxymethylcellul-glycerin (OPTIVE) 0.5-0.9 % ophthalmic solution Place 1 drop into both eyes daily as needed for dry eyes.     Cholecalciferol (VITAMIN D3) 50 MCG (2000 UT) CAPS Take 2,000 Units by mouth daily.     cyanocobalamin (VITAMIN B12) 1000 MCG tablet Take 1,000 mg by mouth daily with breakfast.     diclofenac Sodium (VOLTAREN) 1 % GEL Apply 2 g topically daily as needed (Back pain).     diphenhydrAMINE (BENADRYL) 25 mg capsule Take 25 mg by mouth every 6 (six) hours as needed for itching.     Docusate Sodium (DSS) 100 MG CAPS Take 100 mg by mouth daily as needed (Constipation).     DULoxetine (CYMBALTA) 30 MG capsule Take 1 capsule (30 mg total) by mouth daily. 30 capsule 1   DULoxetine (CYMBALTA) 60 MG capsule Take 1 capsule by mouth twice daily 180 capsule 0   EPINEPHrine 0.3 mg/0.3 mL IJ SOAJ injection Inject 0.3 mg into the muscle as needed for anaphylaxis. (Patient not taking: Reported on 07/03/2023) 2 each 1   famotidine (PEPCID) 20 MG tablet Take 1 tablet (20 mg total) by mouth 2 (two) times daily as needed for heartburn or indigestion. 60 tablet 1   fluticasone (FLONASE) 50 MCG/ACT nasal spray Use 2 spray(s) in each nostril once daily (Patient taking differently: Place 2 sprays into both nostrils daily as needed for allergies or rhinitis.) 16 g 5   fluticasone (FLOVENT HFA) 110 MCG/ACT inhaler Inhale 2 puffs into the lungs 2 (two)  times daily. (Patient taking differently: Inhale 2 puffs into the lungs daily as needed (Short of breath).) 1 each 5   fluticasone-salmeterol (ADVAIR) 250-50 MCG/ACT AEPB Inhale 1 puff into the lungs in the morning and at bedtime. 1 each 2   folic acid (FOLVITE) 1 MG tablet Take 1 tablet (1 mg total) by mouth daily.     furosemide  (LASIX) 20 MG tablet Take 1 tablet (20 mg total) by mouth as needed for edema. 90 tablet 3   gabapentin (NEURONTIN) 300 MG capsule Take 300-900 mg by mouth See admin instructions. Take 600 mg in the morning 300 mg at lunch time and 900 mg at bedtime     hydroxychloroquine (PLAQUENIL) 200 MG tablet Take 200 mg by mouth daily.     hydrOXYzine (ATARAX) 10 MG tablet Take 1 tablet (10 mg total) by mouth 3 (three) times daily as needed. 30 tablet 1   linaclotide (LINZESS) 145 MCG CAPS capsule Take 1 capsule (145 mcg total) by mouth daily as needed. Lot: [id number]  Exp:11-2023 12 capsule 0   loratadine (CLARITIN) 10 MG tablet Take 10 mg by mouth daily as needed for rhinitis.     Multiple Vitamins-Minerals (MULTIVITAMIN WITH MINERALS) tablet Take 1 tablet by mouth daily. (Patient taking differently: Take 1 tablet by mouth daily. 50+)     nitroGLYCERIN (NITROSTAT) 0.4 MG SL tablet Place 1 tablet (0.4 mg total) under the tongue every 5 (five) minutes as needed for chest pain. 25 tablet 1   nystatin cream (MYCOSTATIN) Apply 1 application topically 2 (two) times daily. (Patient taking differently: Apply 1 application  topically daily as needed (vaginal yeast).) 30 g 2   pantoprazole (PROTONIX) 40 MG tablet Take 1 tablet (40 mg total) by mouth 2 (two) times daily. 90 tablet 1   pentosan polysulfate (ELMIRON) 100 MG capsule Take 1 capsule (100 mg total) by mouth 3 (three) times daily. Reported on 01/05/2016 (Patient taking differently: Take 100 mg by mouth daily as needed (burning and pain). Reported on 01/05/2016) 90 capsule 11   polyethylene glycol (MIRALAX) 17 g packet Take 17 g by mouth 2 (two) times daily. 14 each 0   REPATHA SURECLICK 140 MG/ML SOAJ INJECT 140 MG INTO THE SKIN EVERY 14 DAYS 6 mL 0   sucralfate (CARAFATE) 1 GM/10ML suspension Take 10 mLs (1 g total) by mouth 4 (four) times daily -  with meals and at bedtime. (Patient taking differently: Take 1 g by mouth 2 (two) times daily.) 420 mL 1   traMADol  (ULTRAM) 50 MG tablet Take 25 mg by mouth 2 (two) times daily.     traZODone (DESYREL) 100 MG tablet TAKE 1 TABLET BY MOUTH AT BEDTIME 90 tablet 0   Vitamin D, Ergocalciferol, (DRISDOL) 1.25 MG (50000 UNIT) CAPS capsule Take 1 capsule (50,000 Units total) by mouth every 7 (seven) days. 12x Weeks 5 capsule 3   No current facility-administered medications for this visit.   Allergies: Allergies  Allergen Reactions   Clindamycin Hcl Shortness Of Breath and Rash   Penicillins Anaphylaxis   Rosuvastatin Anaphylaxis   Baclofen Other (See Comments) and Rash   Lincomycin Other (See Comments)   Prednisone Rash    Other reaction(s): Rash, Hives - can tolerate if she takes with benadryl    Codeine Hives and Other (See Comments)    headache Other reaction(s): Headache, Nausea, headache   Doxycycline     Skin rash   Erythromycin Hives   Haemophilus Influenzae Other (See Comments)  Local reaction at the site Local reaction at the site   Haemophilus Influenzae Vaccines Other (See Comments)    Local reaction at site   Latex Hives   Levofloxacin     Other reaction(s): sick   Other Other (See Comments)    Father had malignant hyperthermia   Pentazocine Lactate Other (See Comments)    HALLUCINATION   Pneumococcal Vaccine Polyvalent Hives, Swelling and Other (See Comments)    REACTION: redness, swelling, and hives at injection site   Tamoxifen Nausea And Vomiting and Other (See Comments)    HEADACHE Other reaction(s): Headache, Nausea   Fluzone [Influenza Virus Vaccine] Hives    Local reaction at the site   I reviewed her past medical history, social history, family history, and environmental history and no significant changes have been reported from her previous visit.  Review of Systems  Constitutional:  Negative for appetite change, chills, fever and unexpected weight change.  HENT:  Positive for rhinorrhea and sore throat. Negative for congestion.   Eyes:  Negative for itching.   Respiratory:  Negative for cough, chest tightness, shortness of breath and wheezing.   Cardiovascular:  Negative for chest pain.  Gastrointestinal:  Positive for abdominal pain.  Genitourinary:  Negative for difficulty urinating.  Skin:  Negative for rash.  Neurological:  Negative for headaches.   Objective: There were no vitals taken for this visit. There is no height or weight on file to calculate BMI. Physical Exam Vitals and nursing note reviewed.  Constitutional:      Appearance: Normal appearance. She is well-developed.  HENT:     Head: Normocephalic and atraumatic.     Right Ear: Tympanic membrane and external ear normal.     Left Ear: Tympanic membrane and external ear normal.     Nose: Nose normal.     Mouth/Throat:     Mouth: Mucous membranes are moist.     Pharynx: Oropharynx is clear.  Eyes:     Conjunctiva/sclera: Conjunctivae normal.  Cardiovascular:     Rate and Rhythm: Normal rate and regular rhythm.     Heart sounds: Normal heart sounds. No murmur heard.    No friction rub. No gallop.  Pulmonary:     Effort: Pulmonary effort is normal.     Breath sounds: Normal breath sounds. No wheezing, rhonchi or rales.  Musculoskeletal:     Cervical back: Neck supple.  Skin:    General: Skin is warm.     Findings: No rash.  Neurological:     Mental Status: She is alert and oriented to person, place, and time.  Psychiatric:        Behavior: Behavior normal.     Diagnostics: Spirometry:  Tracings reviewed. Her effort: {Blank single:19197::"Good reproducible efforts.","It was hard to get consistent efforts and there is a question as to whether this reflects a maximal maneuver.","Poor effort, data can not be interpreted."} FVC: ***L FEV1: ***L, ***% predicted FEV1/FVC ratio: ***% Interpretation: {Blank single:19197::"Spirometry consistent with mild obstructive disease","Spirometry consistent with moderate obstructive disease","Spirometry consistent with severe  obstructive disease","Spirometry consistent with possible restrictive disease","Spirometry consistent with mixed obstructive and restrictive disease","Spirometry uninterpretable due to technique","Spirometry consistent with normal pattern","No overt abnormalities noted given today's efforts"}.  Please see scanned spirometry results for details.  Skin Testing: {Blank single:19197::"None","Deferred due to recent antihistamines use"}. Positive test to: ***. Negative test to: ***.  Results discussed with patient/family.   Previous notes and tests were reviewed. The plan was reviewed with the patient/family, and all questions/concerned  were addressed.  It was my pleasure to see Kaylin today and participate in her care. Please feel free to contact me with any questions or concerns.  Sincerely,  Wyline Mood, DO Allergy & Immunology  [facility] office: [phone] [facility] office: [phone]

## 2023-07-12 ENCOUNTER — Encounter: Payer: Medicare Other | Admitting: Allergy

## 2023-07-12 NOTE — Therapy (Deleted)
OUTPATIENT PHYSICAL THERAPY FEMALE PELVIC EVALUATION   Patient Name: [name] MRN: [id number] DOB:12-18-1946, 77 y.o., female Today's Date: 07/12/2023  END OF SESSION:   Past Medical History:  Diagnosis Date   Allergy    Anemia    Anxiety    past hx    Arthritis    Asthma    Atrial flutter (HCC)    past history- not current   Breast cancer (HCC)    CAD (coronary artery disease) CARDIOLOGIST--  DR Clifton James   mild non-obstructive cad   Cancer (HCC)    right   Cataract    bilaterally removed    Chronic constipation    Chronic kidney disease    stage 3 ckd, interstitial cystitis   Concussion    x 3   COPD (chronic obstructive pulmonary disease) (HCC)    Depression    past hx    Dyspnea    Dysrhythmia    Family history of adverse reaction to anesthesia    Family history of malignant hyperthermia    father had this   Fibromyalgia    Frequency of urination    GERD (gastroesophageal reflux disease)    H/O hiatal hernia    History of basal cell carcinoma excision    X2   History of breast cancer ONCOLOGIST-- DR Darnelle Catalan---  NO RECURRANCE   DX 07/2012;  LOW GRADE DCIS  ER+PR+  ----  S/P RIGHT LUMPECTOMY WITH NEGATIVE MARGINS/   RADIATION ENDED 11/2012   History of chronic bronchitis    History of colonic polyps    History of tachycardia    CONTROLLED  WITH ATENOLOL   Hyperlipidemia    Hypertension    Neuromuscular disorder (HCC)    fibromyalgia   Neuropathy    Osteoporosis 01/2019   T score -2.2 stable/improved from prior study   Pelvic pain    Personal history of radiation therapy    Pneumonia    S/P radiation therapy 11/12/12 - 12/05/12   right Breast   Sepsis (HCC) 2014   from UTI    Sinus headache    Sjogren's disease (HCC)    Urgency of urination    Past Surgical History:  Procedure Laterality Date   39 HOUR PH STUDY N/A 04/03/2016   Procedure: 24 HOUR PH STUDY;  Surgeon: Ruffin Frederick, MD;  Location: [facility];  Service:  Gastroenterology;  Laterality: N/A;   BREAST BIOPSY Right 08/23/2012   ADH   BREAST BIOPSY Right 10/11/2012   Ductal Carcinoma   BREAST EXCISIONAL BIOPSY Right 09/04/2013   benign   BREAST LUMPECTOMY Right 10/11/2012   W/ SLN BX   CARDIAC CATHETERIZATION  09-13-2007  DR Riley Kill   WELL-PRESERVED LVF/  DIFFUSE SCATTERED CORONARY CALCIFACATION AND ATHEROSCLEROSIS WITHOUT OBSTRUCTION   CARDIAC CATHETERIZATION  08-04-2010  DR MCALHANY   NON-OBSTRUCTIVE CAD/  pLAD 40%/  oLAD 30%/  mLAD 30%/  pRCA 30%/  EF 60%   CARDIOVASCULAR STRESS TEST  06-18-2012  DR McALHANY   LOW RISK NUCLEAR STUDY/  SMALL FIXED AREA OF MODERATELY DECREASED UPTAKE IN ANTEROSEPTAL WALL WHICH MAY BE ARTIFACTUAL/  NO ISCHEMIA/  EF 68%   COLONOSCOPY  09/29/2010   CYSTOSCOPY     CYSTOSCOPY WITH HYDRODISTENSION AND BIOPSY N/A 03/06/2014   Procedure: CYSTOSCOPY/HYDRODISTENSION/ INSTILATION OF MARCAINE AND PYRIDIUM;  Surgeon: Kathi Ludwig, MD;  Location: [facility];  Service: Urology;  Laterality: N/A;   CYSTOSCOPY/URETEROSCOPY/HOLMIUM LASER/STENT PLACEMENT Left 03/21/2023   Procedure: CYSTOSCOPY LEFT RETROGRADE PYELOGRAM, LEFT  URETEROSCOPY, HOLMIUM LASER LITHOTRIPSYM, AND LEFT URETERAL STENT PLACEMENT;  Surgeon: Rene Paci, MD;  Location: [facility];  Service: Urology;  Laterality: Left;  60 MINUTES   ESOPHAGEAL MANOMETRY N/A 04/03/2016   Procedure: ESOPHAGEAL MANOMETRY (EM);  Surgeon: Ruffin Frederick, MD;  Location: [facility];  Service: Gastroenterology;  Laterality: N/A;   EXTRACORPOREAL SHOCK WAVE LITHOTRIPSY Left 02/06/2019   Procedure: EXTRACORPOREAL SHOCK WAVE LITHOTRIPSY (ESWL);  Surgeon: Ihor Gully, MD;  Location: [facility];  Service: Urology;  Laterality: Left;   NASAL SINUS SURGERY  1985   ORIF RIGHT ANKLE  FX  2006   POLYPECTOMY     REMOVAL VOCAL CORD CYST  01/2013   RIGHT BREAST BX  08/23/2012   RIGHT HAND SURGERY  X3  LAST ONE 2009   INCLUDES  ORIF RIGHT 5TH FINGER AND  REVISION TWICE   SKIN CANCER EXCISION     face,arms   TONSILLECTOMY AND ADENOIDECTOMY  AGE 63   TOTAL ABDOMINAL HYSTERECTOMY W/ BILATERAL SALPINGOOPHORECTOMY  1982   W/  APPENDECTOMY   TRANSTHORACIC ECHOCARDIOGRAM  06/24/2012   GRADE I DIASTOLIC DYSFUNCTION/  EF 55-60%/  MILD MR   UPPER GASTROINTESTINAL ENDOSCOPY     Patient Active Problem List   Diagnosis Date Noted   Bronchiolitis 06/19/2023   Multiple drug allergies 05/09/2023   Recurrent infections 05/09/2023   Rash and other nonspecific skin eruption 05/09/2023   Local reaction to hymenoptera sting 05/09/2023   Allergic reaction 05/09/2023   Other adverse food reactions, not elsewhere classified, subsequent encounter 05/09/2023   Moderate persistent asthma without complication 05/09/2023   Other allergic rhinitis 05/09/2023   Weight loss 05/01/2023   Strep pharyngitis 04/24/2023   Common bile duct dilatation 02/13/2023   Oral herpes 02/05/2023   Tongue lesion 02/05/2023   Drug reaction 02/05/2023   Cellulitis of breast 02/05/2023   Elevated alkaline phosphatase level 01/25/2023   Facial trauma 01/22/2023   Vertigo 01/22/2023   Intertrigo 12/13/2022   Hyperkalemia 09/28/2022   Anemia 09/28/2022   Tremor 07/05/2022   RUQ pain 07/05/2022   Leukocytosis 07/05/2022   Chronic renal insufficiency 03/21/2022   Sjogren's disease (HCC) 12/12/2021   Vocal cord cyst 05/18/2021   High serum vitamin D 05/18/2021   Dysuria 03/09/2021   Vitamin B12 deficiency 03/08/2021   Preventative health care 03/08/2021   Hyperglycemia 03/08/2021   Low back pain 09/08/2020   OSA and COPD overlap syndrome (HCC) 06/10/2020   Recurrent falls 05/02/2020   Statin intolerance 02/29/2020   RLS (restless legs syndrome) 11/09/2019   Kidney stone 02/26/2019   Recurrent sinusitis 02/25/2019   Hx of colonic polyp 02/25/2019   DDD (degenerative disc disease), cervical 09/17/2018   Degenerative scoliosis 04/22/2018   Primary insomnia 06/25/2017    Chronic interstitial cystitis 02/01/2017   Chronic cough 01/09/2017   Right arm pain 07/20/2016   Deviated septum 05/12/2016   Nasal turbinate hypertrophy 05/12/2016   Dysphagia    Allergic rhinitis 01/25/2016   Other and combined forms of senile cataract 07/15/2015   Dyspnea    Left hip pain 04/30/2015   Sciatica 04/30/2015   Knee pain, right 04/30/2015   Bilateral carpal tunnel syndrome 03/04/2015   Hyperlipidemia 03/01/2015   Pulmonary nodules 02/12/2015   External hemorrhoids 02/05/2015   Chronic night sweats 01/08/2015   Atypical chest pain 01/01/2015   CAP (community acquired pneumonia) 01/01/2015   Memory impairment 05/22/2014   Peripheral neuropathy 05/22/2014   Abdominal pain 10/26/2013   Headache 10/13/2013   Arthralgia 08/18/2013  ANA positive 07/29/2013   Depression 02/05/2013   Family history of malignant hyperthermia 02/05/2013   Malignant neoplasm of lower-outer quadrant of right breast of female, estrogen receptor positive (HCC) 01/03/2013   Splenic lesion 09/02/2012   Asthma, allergic 08/05/2012   Acute bronchitis with COPD (HCC) 06/03/2012   GERD (gastroesophageal reflux disease) 06/03/2012   Edema 04/08/2012   Stricture and stenosis of esophagus 10/19/2011   Constipation 07/21/2011   Nonspecific (abnormal) findings on radiological and other examination of biliary tract 07/21/2011   Osteoporosis 04/17/2011   Leg pain 02/24/2011   FIBROCYSTIC BREAST DISEASE, HX OF 10/18/2010   Essential hypertension 08/25/2010   CAD in native artery 08/25/2010   TOBACCO ABUSE, HX OF 08/25/2010   GENERALIZED ANXIETY DISORDER 08/03/2010   Breast pain 01/11/2010   Fibromyalgia 01/11/2010    PCP: Bradd Canary, MD  REFERRING PROVIDER: Benancio Deeds, MD   REFERRING DIAG:  K59.09 (ICD-10-CM) - Chronic constipation  R10.30 (ICD-10-CM) - Lower abdominal pain    THERAPY DIAG:  No diagnosis found.  Rationale for Evaluation and Treatment:  Rehabilitation  ONSET DATE: ***  SUBJECTIVE:                                                                                                                                                                                           SUBJECTIVE STATEMENT: Colon is tortuous with looping Fluid intake: {Yes/No:304960894}   PAIN:  Are you having pain? {yes/no:20286} NPRS scale: ***/10 Pain location: {pelvic pain location:27098}  Pain type: {type:313116} Pain description: {PAIN DESCRIPTION:21022940}   Aggravating factors: *** Relieving factors: ***  PRECAUTIONS: {Therapy precautions:24002}  RED FLAGS: {PT Red Flags:29287}   WEIGHT BEARING RESTRICTIONS: {Yes ***/No:24003}  FALLS:  Has patient fallen in last 6 months? {fallsyesno:27318}  LIVING ENVIRONMENT: Lives with: {[facility] lives with:25569::"lives with their family"} Lives in: {Lives in:25570} Stairs: {opstairs:27293} Has following equipment at home: {Assistive devices:23999}  OCCUPATION: ***  PLOF: {PLOF:24004}  PATIENT GOALS: ***  PERTINENT HISTORY:  Sjorens disease; Total abdominal hysterectomy with bilateral salpingoophorectomy ; COPD; Breast cancer with radiation; osteoporosis;  Sexual abuse: {Yes/No:304960894}  BOWEL MOVEMENT: Pain with bowel movement: {yes/no:20286} Type of bowel movement:Type (Bristol Stool Scale) ***, Frequency ***, Strain Yes, and Splinting ***; manual maneuvers to get stool out Fully empty rectum: {Yes/No:304960894} Leakage: {Yes/No:304960894} Pads: {Yes/No:304960894} Fiber supplement: {Yes/No:304960894}  URINATION: Pain with urination: {yes/no:20286} Fully empty bladder: {Yes/No:304960894} Stream: {PT urination:27102} Urgency: {Yes/No:304960894} Frequency: *** Leakage: {PT leakage:27103} Pads: {Yes/No:304960894}  INTERCOURSE: Pain with intercourse: {pain with intercourse PA:27099} Ability to have vaginal penetration:  {Yes/No:304960894} Climax: *** Marinoff Scale:  ***/3  PREGNANCY: Vaginal deliveries *** Tearing {Yes***/No:304960894} C-section deliveries ***  Currently pregnant {Yes***/No:304960894}  PROLAPSE: {PT prolapse:27101}   OBJECTIVE:   DIAGNOSTIC FINDINGS:  ***  PATIENT SURVEYS:  {rehab surveys:24030}  PFIQ-7 ***  COGNITION: Overall cognitive status: {cognition:24006}     SENSATION: Light touch: {intact/deficits:24005} Proprioception: {intact/deficits:24005}  MUSCLE LENGTH: Hamstrings: Right *** deg; Left *** deg Thomas test: Right *** deg; Left *** deg  LUMBAR SPECIAL TESTS:  {lumbar special test:25242}  FUNCTIONAL TESTS:  {Functional tests:24029}  GAIT: Distance walked: *** Assistive device utilized: {Assistive devices:23999} Level of assistance: {Levels of assistance:24026} Comments: ***  POSTURE: {posture:25561}  PELVIC ALIGNMENT:  LUMBARAROM/PROM:  A/PROM A/PROM  eval  Flexion   Extension   Right lateral flexion   Left lateral flexion   Right rotation   Left rotation    (Blank rows = not tested)  LOWER EXTREMITY ROM:  {AROM/PROM:27142} ROM Right eval Left eval  Hip flexion    Hip extension    Hip abduction    Hip adduction    Hip internal rotation    Hip external rotation    Knee flexion    Knee extension    Ankle dorsiflexion    Ankle plantarflexion    Ankle inversion    Ankle eversion     (Blank rows = not tested)  LOWER EXTREMITY MMT:  MMT Right eval Left eval  Hip flexion    Hip extension    Hip abduction    Hip adduction    Hip internal rotation    Hip external rotation    Knee flexion    Knee extension    Ankle dorsiflexion    Ankle plantarflexion    Ankle inversion    Ankle eversion     PALPATION:   General  ***                External Perineal Exam ***                             Internal Pelvic Floor ***  Patient confirms identification and approves PT to assess internal pelvic floor and treatment {yes/no:20286}  PELVIC MMT:   MMT eval  Vaginal    Internal Anal Sphincter   External Anal Sphincter   Puborectalis   Diastasis Recti   (Blank rows = not tested)        TONE: ***  PROLAPSE: ***  TODAY'S TREATMENT:                                                                                                                              DATE: ***  EVAL ***   PATIENT EDUCATION:  Education details: *** Person educated: {Person educated:25204} Education method: {Education Method:25205} Education comprehension: {Education Comprehension:25206}  HOME EXERCISE PROGRAM: ***  ASSESSMENT:  CLINICAL IMPRESSION: Patient is a *** y.o. *** who was seen today for physical therapy evaluation and treatment for ***.   OBJECTIVE IMPAIRMENTS: {opptimpairments:25111}.   ACTIVITY LIMITATIONS: {activitylimitations:27494}  PARTICIPATION  LIMITATIONS: {participationrestrictions:25113}  PERSONAL FACTORS: {Personal factors:25162} are also affecting patient's functional outcome.   REHAB POTENTIAL: {rehabpotential:25112}  CLINICAL DECISION MAKING: {clinical decision making:25114}  EVALUATION COMPLEXITY: {Evaluation complexity:25115}   GOALS: Goals reviewed with patient? {yes/no:20286}  SHORT TERM GOALS: Target date: ***  *** Baseline: Goal status: INITIAL  2.  *** Baseline:  Goal status: INITIAL  3.  *** Baseline:  Goal status: INITIAL  4.  *** Baseline:  Goal status: INITIAL  5.  *** Baseline:  Goal status: INITIAL  6.  *** Baseline:  Goal status: INITIAL  LONG TERM GOALS: Target date: ***  *** Baseline:  Goal status: INITIAL  2.  *** Baseline:  Goal status: INITIAL  3.  *** Baseline:  Goal status: INITIAL  4.  *** Baseline:  Goal status: INITIAL  5.  *** Baseline:  Goal status: INITIAL  6.  *** Baseline:  Goal status: INITIAL  PLAN:  PT FREQUENCY: {rehab frequency:25116}  PT DURATION: {rehab duration:25117}  PLANNED INTERVENTIONS: {rehab planned interventions:25118::"Therapeutic  exercises","Therapeutic activity","Neuromuscular re-education","Balance training","Gait training","Patient/Family education","Self Care","Joint mobilization"}  PLAN FOR NEXT SESSION: ***   Lizvette Lightsey, PT 07/12/2023, 8:07 AM

## 2023-07-13 ENCOUNTER — Other Ambulatory Visit: Payer: Self-pay

## 2023-07-13 ENCOUNTER — Ambulatory Visit: Payer: Medicare Other | Attending: Gastroenterology | Admitting: Physical Therapy

## 2023-07-13 DIAGNOSIS — R252 Cramp and spasm: Secondary | ICD-10-CM | POA: Diagnosis not present

## 2023-07-13 DIAGNOSIS — M6281 Muscle weakness (generalized): Secondary | ICD-10-CM | POA: Insufficient documentation

## 2023-07-13 DIAGNOSIS — M545 Low back pain, unspecified: Secondary | ICD-10-CM | POA: Insufficient documentation

## 2023-07-13 DIAGNOSIS — R278 Other lack of coordination: Secondary | ICD-10-CM | POA: Insufficient documentation

## 2023-07-13 DIAGNOSIS — G8929 Other chronic pain: Secondary | ICD-10-CM | POA: Diagnosis not present
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
## 2023-07-16 ENCOUNTER — Telehealth: Payer: Self-pay | Admitting: Family Medicine

## 2023-07-16 ENCOUNTER — Ambulatory Visit (INDEPENDENT_AMBULATORY_CARE_PROVIDER_SITE_OTHER): Payer: Medicare Other | Admitting: Pharmacist

## 2023-07-16 DIAGNOSIS — E785 Hyperlipidemia, unspecified: Secondary | ICD-10-CM

## 2023-07-16 NOTE — Progress Notes (Signed)
07/16/2023 Name: [name] MRN: [id number] DOB: [birth year]  Chief Complaint  Patient presents with   Hyperlipidemia    Med assistance    [name] is a 77 y.o. year old female who presented for a telephone visit.   They were referred to the pharmacist by their Case Management Team  for assistance in managing medication access.    Subjective:  Medication Access/Adherence  Current Pharmacy:  [facility] [place], [place] - [address] [place] [address] Phone: [phone] Fax: [phone]  [facility] [address] [employer], [place] - [address] [place] [address] Phone: [phone] Fax: [phone]   Patient reports affordability concerns with their medications: Yes  Patient reports access/transportation concerns to their pharmacy: No  Patient reports adherence concerns with their medications:  No     Hyperlipidemia/ASCVD Risk Reduction  Current lipid lowering medications: Repatha 140mg  SQ every 14 days.  Medications tried in the past: rosuvastatin - anaphylaxis; atorvastatin - severe joint and muscle pains.   Antiplatelet regimen: aspirin 81mg  daily    Current medication access support: [employer] thru 09/2023 She has about $1900 left    Objective:  Lab Results  Component Value Date   HGBA1C 5.5 05/28/2023    Lab Results  Component Value Date   CREATININE 1.17 (H) 06/14/2023   BUN 17 06/14/2023   NA 135 06/14/2023   K 3.6 06/14/2023   CL 100 06/14/2023   CO2 25 06/14/2023    Lab Results  Component Value Date   CHOL 122 07/05/2023   HDL 41.10 07/05/2023   LDLCALC 52 07/05/2023   LDLDIRECT 74.0 06/12/2023   TRIG 141.0 07/05/2023   CHOLHDL 3 07/05/2023    Medications Reviewed Today     Reviewed by Henrene Pastor, RPH-CPP (Pharmacist) on 07/16/23 at 1644  Med List Status: <None>   Medication Order Taking?  Sig Documenting Provider Last Dose Status Informant  acetaminophen (TYLENOL) 500 MG tablet [id number] Yes Take 1,000 mg by mouth every 6 (six) hours as needed for mild pain or headache.  [provider] Taking Active Self  albuterol (VENTOLIN HFA) 108 (90 Base) MCG/ACT inhaler [id number] Yes Inhale 2 puffs into the lungs every 4 (four) hours as needed for wheezing or shortness of breath (coughing fits). Ellamae Sia, DO Taking Active   amLODipine (NORVASC) 5 MG tablet [id number] Yes Take 1 tablet (5 mg total) by mouth daily. Lewayne Bunting, MD Taking Active   amoxicillin (AMOXIL) 250 MG/5ML suspension [id number]  Do NOT take at home. Bring to Dr. Elmyra Ricks office for drug challenge. Ellamae Sia, DO  Active   aspirin EC 81 MG tablet [id number] Yes Take 1 tablet (81 mg total) by mouth daily. Kathleene Hazel, MD Taking Active Self  atenolol (TENORMIN) 25 MG tablet [id number] Yes Take 1 tablet (25 mg total) by mouth 2 (two) times daily. Take 1 tablet by mouth 2 time a day Lewayne Bunting, MD Taking Active   CALCIUM PO [id number] Yes Take 500 mg by mouth daily. [provider] Taking Active Self  carboxymethylcellul-glycerin (OPTIVE) 0.5-0.9 % ophthalmic solution [id number] Yes Place 1 drop into both eyes daily as needed for dry eyes. [provider] Taking Active Self  cefdinir (OMNICEF) 300 MG capsule [id number] Yes Take 1 capsule (300 mg total) by mouth 2 (two) times daily for 10 days. Noemi Chapel, NP Taking Active   Cholecalciferol (VITAMIN  D3) 50 MCG (2000 UT) CAPS [id number] Yes Take 2,000 Units by mouth daily. [provider] Taking Active Self  cyanocobalamin (VITAMIN B12) 1000 MCG tablet [id number] Yes Take 1,000 mg by mouth daily with breakfast. [provider] Taking Active Self  diclofenac Sodium (VOLTAREN) 1 % GEL [id number] Yes Apply 2 g topically daily as needed (Back pain). [provider] Taking Active Self  diphenhydrAMINE (BENADRYL)  25 mg capsule [id number] Yes Take 25 mg by mouth every 6 (six) hours as needed for itching. [provider] Taking Active Self  Docusate Sodium (DSS) 100 MG CAPS [id number] Yes Take 100 mg by mouth daily as needed (Constipation). [provider] Taking Active Self  DULoxetine (CYMBALTA) 60 MG capsule [id number] Yes Take 1 capsule by mouth twice daily Bradd Canary, MD Taking Active   EPINEPHrine 0.3 mg/0.3 mL IJ SOAJ injection [id number] Yes Inject 0.3 mg into the muscle as needed for anaphylaxis. Ellamae Sia, DO Taking Active   famotidine (PEPCID) 20 MG tablet [id number] Yes Take 1 tablet (20 mg total) by mouth 2 (two) times daily as needed for heartburn or indigestion. Bradd Canary, MD Taking Active   fluticasone Aleda Grana) 50 MCG/ACT nasal spray [id number] Yes Use 2 spray(s) in each nostril once daily  Patient taking differently: Place 2 sprays into both nostrils daily as needed for allergies or rhinitis.   Bradd Canary, MD Taking Active Self  fluticasone [facility] HFA) 110 MCG/ACT inhaler [id number] Yes Inhale 2 puffs into the lungs 2 (two) times daily.  Patient taking differently: Inhale 2 puffs into the lungs daily as needed (Short of breath).   Parrett, Virgel Bouquet, NP Taking Active Self  fluticasone-salmeterol (ADVAIR) 250-50 MCG/ACT AEPB [id number] Yes Inhale 1 puff into the lungs in the morning and at bedtime. Arthor Captain, PA-C Taking Active   folic acid (FOLVITE) 1 MG tablet [id number] Yes Take 1 tablet (1 mg total) by mouth daily. Sandford Craze, NP Taking Active   furosemide (LASIX) 20 MG tablet [id number]  Take 1 tablet (20 mg total) by mouth as needed for edema. Lewayne Bunting, MD  Active Self  gabapentin (NEURONTIN) 300 MG capsule [id number] Yes Take 300-900 mg by mouth See admin instructions. Take 600 mg in the morning 300 mg at lunch time and 900 mg at bedtime Bradd Canary, MD Taking Active Self  hydrOXYzine (ATARAX) 10 MG tablet [id number] Yes Take 1 tablet  (10 mg total) by mouth 3 (three) times daily as needed. Bradd Canary, MD Taking Active   linaclotide Karlene Einstein) 145 MCG CAPS capsule [id number] Yes Take 1 capsule (145 mcg total) by mouth daily as needed. Lot: [id number]  Exp:11-2023 Benancio Deeds, MD Taking Active   loratadine (CLARITIN) 10 MG tablet [id number] Yes Take 10 mg by mouth daily as needed for rhinitis. [provider] Taking Active Self  Multiple Vitamins-Minerals (MULTIVITAMIN WITH MINERALS) tablet [id number] Yes Take 1 tablet by mouth daily.  Patient taking differently: Take 1 tablet by mouth daily. 50+   Sandford Craze, NP Taking Active Self  nitroGLYCERIN (NITROSTAT) 0.4 MG SL tablet [id number] Yes Place 1 tablet (0.4 mg total) under the tongue every 5 (five) minutes as needed for chest pain. Clayborne Dana, NP Taking Active Self           Med Note Dareen Piano [facility]   Wed May 02, 2023 10:31 AM)    nystatin cream (MYCOSTATIN) [id number] Yes Apply 1 application topically 2 (two) times daily.  Patient taking differently: Apply  1 application  topically daily as needed (vaginal yeast).   Bradd Canary, MD Taking Active Self  pantoprazole (PROTONIX) 40 MG tablet [id number] Yes Take 1 tablet (40 mg total) by mouth 2 (two) times daily. Bradd Canary, MD Taking Active   pentosan polysulfate (ELMIRON) 100 MG capsule [id number]  Take 1 capsule (100 mg total) by mouth 3 (three) times daily. Reported on 01/05/2016  Patient taking differently: Take 100 mg by mouth daily as needed (burning and pain). Reported on 01/05/2016   Ok Edwards, MD  Active Self  polyethylene glycol [facility]) 17 g packet [id number] Yes Take 17 g by mouth 2 (two) times daily. Benancio Deeds, MD Taking Active   predniSONE (DELTASONE) 10 MG tablet [id number] Yes 4 tabs for 2 days, then 3 tabs for 2 days, 2 tabs for 2 days, then 1 tab for 2 days, then stop Noemi Chapel, NP Taking Active   REPATHA SURECLICK 140 MG/ML SOAJ [id number] Yes INJECT 140  MG INTO THE SKIN EVERY 14 DAYS Bradd Canary, MD Taking Active   sucralfate (CARAFATE) 1 GM/10ML suspension [id number]  Take 10 mLs (1 g total) by mouth 4 (four) times daily -  with meals and at bedtime.  Patient taking differently: Take 1 g by mouth 2 (two) times daily.   Donato Schultz, DO  Active Self  traMADol Janean Sark) 50 MG tablet [id number] Yes Take 25 mg by mouth 2 (two) times daily. [provider] Taking Active Self  traZODone (DESYREL) 100 MG tablet [id number] Yes TAKE 1 TABLET BY MOUTH AT BEDTIME Bradd Canary, MD Taking Active   Vitamin D, Ergocalciferol, (DRISDOL) 1.25 MG (50000 UNIT) CAPS capsule [id number] Yes Take 1 capsule (50,000 Units total) by mouth every 7 (seven) days. 12x Marilynn Latino, MD Taking Active Self              Assessment/Plan:   Hyperlipidemia/ASCVD Risk Reduction with history of statin myopathy - Currently controlled.  - Recommend to continue Repatha 140mg  every 14 days. - Coordinated with pharmacy to process Rx using patient's [employer].    Follow Up Plan: 2 to 3 months to start process for re-applying for [facility] Larwance Sachs, PharmD Clinical Pharmacist [facility]

## 2023-07-16 NOTE — Telephone Encounter (Signed)
Pt states a nurse from our office had helped her a few months ago with repatha injections and she would like to get that done again. She thinks it was Hospital doctor. Please advise.

## 2023-07-16 NOTE — Telephone Encounter (Signed)
See phone visit note for 07/16/2023

## 2023-07-17 ENCOUNTER — Ambulatory Visit: Payer: Medicare Other | Admitting: Behavioral Health

## 2023-07-18 ENCOUNTER — Ambulatory Visit: Payer: Medicare Other | Admitting: Physical Therapy

## 2023-07-19 ENCOUNTER — Encounter: Payer: Self-pay | Admitting: Physical Therapy

## 2023-07-19 ENCOUNTER — Ambulatory Visit: Payer: Medicare Other | Admitting: Physical Therapy

## 2023-07-19 DIAGNOSIS — H8112 Benign paroxysmal vertigo, left ear: Secondary | ICD-10-CM

## 2023-07-19 DIAGNOSIS — R2681 Unsteadiness on feet: Secondary | ICD-10-CM | POA: Diagnosis not present

## 2023-07-19 DIAGNOSIS — M542 Cervicalgia: Secondary | ICD-10-CM | POA: Diagnosis not present

## 2023-07-19 DIAGNOSIS — R252 Cramp and spasm: Secondary | ICD-10-CM

## 2023-07-19 DIAGNOSIS — R278 Other lack of coordination: Secondary | ICD-10-CM

## 2023-07-19 DIAGNOSIS — M545 Low back pain, unspecified: Secondary | ICD-10-CM | POA: Diagnosis not present

## 2023-07-19 DIAGNOSIS — R42 Dizziness and giddiness: Secondary | ICD-10-CM | POA: Diagnosis not present

## 2023-07-19 DIAGNOSIS — G8929 Other chronic pain: Secondary | ICD-10-CM | POA: Diagnosis not present

## 2023-07-19 DIAGNOSIS — M6281 Muscle weakness (generalized): Secondary | ICD-10-CM

## 2023-07-19 NOTE — Therapy (Addendum)
OUTPATIENT PHYSICAL THERAPY NEURO TREATMENT AND RE-CERT   Patient Name: [name] MRN: [id number] DOB:March 17, 1946, 77 y.o., female Today's Date: 07/19/2023  PCP: Danise Edge, MD REFERRING PROVIDER: Danise Edge, MD END OF SESSION:    Visit Number 11    Number of Visits 16     Date for PT Re-Evaluation 07/05/23     Authorization Type [employer] medicare     PT End of Session - 07/19/23 1310     Visit Number 11    Number of Visits 16    Date for PT Re-Evaluation --    Authorization Type [employer] medicare    PT Start Time 1315    PT Stop Time 1400    PT Time Calculation (min) 45 min    Behavior During Therapy WFL for tasks assessed/performed            Past Medical History:  Diagnosis Date   Allergy    Anemia    Anxiety    past hx    Arthritis    Asthma    Atrial flutter (HCC)    past history- not current   Breast cancer (HCC)    CAD (coronary artery disease) CARDIOLOGIST--  DR Clifton James   mild non-obstructive cad   Cancer (HCC)    right   Cataract    bilaterally removed    Chronic constipation    Chronic kidney disease    stage 3 ckd, interstitial cystitis   Concussion    x 3   COPD (chronic obstructive pulmonary disease) (HCC)    Depression    past hx    Dyspnea    Dysrhythmia    Family history of adverse reaction to anesthesia    Family history of malignant hyperthermia    father had this   Fibromyalgia    Frequency of urination    GERD (gastroesophageal reflux disease)    H/O hiatal hernia    History of basal cell carcinoma excision    X2   History of breast cancer ONCOLOGIST-- DR Darnelle Catalan---  NO RECURRANCE   DX 07/2012;  LOW GRADE DCIS  ER+PR+  ----  S/P RIGHT LUMPECTOMY WITH NEGATIVE MARGINS/   RADIATION ENDED 11/2012   History of chronic bronchitis    History of colonic polyps    History of tachycardia    CONTROLLED  WITH ATENOLOL   Hyperlipidemia    Hypertension    Neuromuscular disorder (HCC)    fibromyalgia   Neuropathy    Osteoporosis  01/2019   T score -2.2 stable/improved from prior study   Pelvic pain    Personal history of radiation therapy    Pneumonia    S/P radiation therapy 11/12/12 - 12/05/12   right Breast   Sepsis (HCC) 2014   from UTI    Sinus headache    Sjogren's disease (HCC)    Urgency of urination    Past Surgical History:  Procedure Laterality Date   11 HOUR PH STUDY N/A 04/03/2016   Procedure: 24 HOUR PH STUDY;  Surgeon: Ruffin Frederick, MD;  Location: [facility];  Service: Gastroenterology;  Laterality: N/A;   BREAST BIOPSY Right 08/23/2012   ADH   BREAST BIOPSY Right 10/11/2012   Ductal Carcinoma   BREAST EXCISIONAL BIOPSY Right 09/04/2013   benign   BREAST LUMPECTOMY Right 10/11/2012   W/ SLN BX   CARDIAC CATHETERIZATION  09-13-2007  DR Riley Kill   WELL-PRESERVED LVF/  DIFFUSE SCATTERED CORONARY CALCIFACATION AND ATHEROSCLEROSIS WITHOUT OBSTRUCTION   CARDIAC CATHETERIZATION  08-04-2010  DR MCALHANY   NON-OBSTRUCTIVE CAD/  pLAD 40%/  oLAD 30%/  mLAD 30%/  pRCA 30%/  EF 60%   CARDIOVASCULAR STRESS TEST  06-18-2012  DR McALHANY   LOW RISK NUCLEAR STUDY/  SMALL FIXED AREA OF MODERATELY DECREASED UPTAKE IN ANTEROSEPTAL WALL WHICH MAY BE ARTIFACTUAL/  NO ISCHEMIA/  EF 68%   COLONOSCOPY  09/29/2010   CYSTOSCOPY     CYSTOSCOPY WITH HYDRODISTENSION AND BIOPSY N/A 03/06/2014   Procedure: CYSTOSCOPY/HYDRODISTENSION/ INSTILATION OF MARCAINE AND PYRIDIUM;  Surgeon: Kathi Ludwig, MD;  Location: [facility];  Service: Urology;  Laterality: N/A;   CYSTOSCOPY/URETEROSCOPY/HOLMIUM LASER/STENT PLACEMENT Left 03/21/2023   Procedure: CYSTOSCOPY LEFT RETROGRADE PYELOGRAM, LEFT URETEROSCOPY, HOLMIUM LASER LITHOTRIPSYM, AND LEFT URETERAL STENT PLACEMENT;  Surgeon: Rene Paci, MD;  Location: [facility];  Service: Urology;  Laterality: Left;  60 MINUTES   ESOPHAGEAL MANOMETRY N/A 04/03/2016   Procedure: ESOPHAGEAL MANOMETRY (EM);  Surgeon: Ruffin Frederick, MD;   Location: [facility];  Service: Gastroenterology;  Laterality: N/A;   EXTRACORPOREAL SHOCK WAVE LITHOTRIPSY Left 02/06/2019   Procedure: EXTRACORPOREAL SHOCK WAVE LITHOTRIPSY (ESWL);  Surgeon: Ihor Gully, MD;  Location: [facility];  Service: Urology;  Laterality: Left;   NASAL SINUS SURGERY  1985   ORIF RIGHT ANKLE  FX  2006   POLYPECTOMY     REMOVAL VOCAL CORD CYST  01/2013   RIGHT BREAST BX  08/23/2012   RIGHT HAND SURGERY  X3  LAST ONE 2009   INCLUDES  ORIF RIGHT 5TH FINGER AND REVISION TWICE   SKIN CANCER EXCISION     face,arms   TONSILLECTOMY AND ADENOIDECTOMY  AGE 52   TOTAL ABDOMINAL HYSTERECTOMY W/ BILATERAL SALPINGOOPHORECTOMY  1982   W/  APPENDECTOMY   TRANSTHORACIC ECHOCARDIOGRAM  06/24/2012   GRADE I DIASTOLIC DYSFUNCTION/  EF 55-60%/  MILD MR   UPPER GASTROINTESTINAL ENDOSCOPY     Patient Active Problem List   Diagnosis Date Noted   Bronchiolitis 06/19/2023   Multiple drug allergies 05/09/2023   Recurrent infections 05/09/2023   Rash and other nonspecific skin eruption 05/09/2023   Local reaction to hymenoptera sting 05/09/2023   Allergic reaction 05/09/2023   Other adverse food reactions, not elsewhere classified, subsequent encounter 05/09/2023   Moderate persistent asthma without complication 05/09/2023   Other allergic rhinitis 05/09/2023   Weight loss 05/01/2023   Strep pharyngitis 04/24/2023   Common bile duct dilatation 02/13/2023   Oral herpes 02/05/2023   Tongue lesion 02/05/2023   Drug reaction 02/05/2023   Cellulitis of breast 02/05/2023   Elevated alkaline phosphatase level 01/25/2023   Facial trauma 01/22/2023   Vertigo 01/22/2023   Intertrigo 12/13/2022   Hyperkalemia 09/28/2022   Anemia 09/28/2022   Tremor 07/05/2022   RUQ pain 07/05/2022   Leukocytosis 07/05/2022   Chronic renal insufficiency 03/21/2022   Sjogren's disease (HCC) 12/12/2021   Vocal cord cyst 05/18/2021   High serum vitamin D 05/18/2021   Dysuria 03/09/2021   Vitamin B12  deficiency 03/08/2021   Preventative health care 03/08/2021   Hyperglycemia 03/08/2021   Low back pain 09/08/2020   OSA and COPD overlap syndrome (HCC) 06/10/2020   Recurrent falls 05/02/2020   Statin intolerance 02/29/2020   RLS (restless legs syndrome) 11/09/2019   Kidney stone 02/26/2019   Recurrent sinusitis 02/25/2019   Hx of colonic polyp 02/25/2019   DDD (degenerative disc disease), cervical 09/17/2018   Degenerative scoliosis 04/22/2018   Primary insomnia 06/25/2017   Chronic interstitial cystitis 02/01/2017   Chronic  cough 01/09/2017   Right arm pain 07/20/2016   Deviated septum 05/12/2016   Nasal turbinate hypertrophy 05/12/2016   Dysphagia    Allergic rhinitis 01/25/2016   Other and combined forms of senile cataract 07/15/2015   Dyspnea    Left hip pain 04/30/2015   Sciatica 04/30/2015   Knee pain, right 04/30/2015   Bilateral carpal tunnel syndrome 03/04/2015   Hyperlipidemia 03/01/2015   Pulmonary nodules 02/12/2015   External hemorrhoids 02/05/2015   Chronic night sweats 01/08/2015   Atypical chest pain 01/01/2015   CAP (community acquired pneumonia) 01/01/2015   Memory impairment 05/22/2014   Peripheral neuropathy 05/22/2014   Abdominal pain 10/26/2013   Headache 10/13/2013   Arthralgia 08/18/2013   ANA positive 07/29/2013   Depression 02/05/2013   Family history of malignant hyperthermia 02/05/2013   Malignant neoplasm of lower-outer quadrant of right breast of female, estrogen receptor positive (HCC) 01/03/2013   Splenic lesion 09/02/2012   Asthma, allergic 08/05/2012   Acute bronchitis with COPD (HCC) 06/03/2012   GERD (gastroesophageal reflux disease) 06/03/2012   Edema 04/08/2012   Stricture and stenosis of esophagus 10/19/2011   Constipation 07/21/2011   Nonspecific (abnormal) findings on radiological and other examination of biliary tract 07/21/2011   Osteoporosis 04/17/2011   Leg pain 02/24/2011   FIBROCYSTIC BREAST DISEASE, HX OF 10/18/2010    Essential hypertension 08/25/2010   CAD in native artery 08/25/2010   TOBACCO ABUSE, HX OF 08/25/2010   GENERALIZED ANXIETY DISORDER 08/03/2010   Breast pain 01/11/2010   Fibromyalgia 01/11/2010    ONSET DATE: 04/24/23 REFERRING DIAG:  R42 (ICD-10-CM) - Dizzy spells  R53.1 (ICD-10-CM) - Weakness   THERAPY DIAG:  Cramp and spasm  Other lack of coordination  Muscle weakness (generalized)  Chronic bilateral low back pain without sciatica  Unsteadiness on feet  Dizziness and giddiness  BPPV (benign paroxysmal positional vertigo), left  Rationale for Evaluation and Treatment: Rehabilitation  SUBJECTIVE:                                                                                                                                                                                           SUBJECTIVE STATEMENT: Pt states she twisted her foot/ankle since last visit. Pt states she fell and has been having lateral L foot pain. Pt had ablation on her back -- felt better until the fall. Pt reports her dizziness is not good today. Pt states she had pneumonia but it's cleared up now. Pt reports she woke up feeling fine but within a couple of hours she felt foggy and having a hard time balancing. 6 or 7 dizziness today. Has not been able  to keep up with her exercises. States dizziness has for the most part been well maintained except today.  Pt accompanied by: self  PERTINENT HISTORY: chronic back pain, (scheduled for back ablation in June 2024), h/o vertigo PAIN:  Are you having pain? Yes: NPRS scale: notes mild back pain Pain location: low back  Pain description: chronic Aggravating factors: PT will monitor Relieving factors: *has upcoming intervention scheduled PRECAUTIONS: Fall WEIGHT BEARING RESTRICTIONS: No FALLS: Has patient fallen in last 6 months? Yes. Number of falls 3 LIVING ENVIRONMENT: Lives with: lives with their spouse Lives in: House/apartment Stairs: One step with  one rail PLOF: Independent with basic ADLs-- notes declining mobility.  PATIENT GOALS: "stop the spinning and be able to walk and not falter."  OBJECTIVE:  (Measures in this section from initial evaluation unless otherwise noted) COGNITION: Overall cognitive status:  Patient mentions memory changes since first fall where she hit her head -- she is unable to recall date SENSATION:  Patient reports numbness and cold sensations in feet. LOWER EXTREMITY MMT:   MMT Right Eval Left Eval  Hip flexion 3+/5 4/5  Knee flexion 4/5 4/5  Knee extension 5/5 5/5  Ankle dorsiflexion 3+/5 3+/5  Ankle plantarflexion    Ankle inversion    Ankle eversion    (Blank rows = not tested) BED MOBILITY:  Sit to supine Complete Independence Supine to sit Complete Independence Rolling to Right Complete Independence Rolling to Left Complete Independence *notes "I'm in a fog" with returning to sitting GAIT: Gait pattern:  slowed pace with SPC and wide BOS Distance walked: 100 ft Assistive device utilized: Single point cane FUNCTIONAL TESTS:  Berg= 40/56  UPDATE: 50/56 ON 06/01/23 5 time sit to stand=20.46 seconds with Ues on legs 53M=0.64 m/sec or 2.10 ft/sec  UPDATE: 3.44 ft/sec ON 06/01/23 PATIENT SURVEYS:  FOTO n/a--not measured  due to vertigo/balance  VESTIBULAR ASSESSMENT: GENERAL OBSERVATION: Gets intermittent dizziness SYMPTOM BEHAVIOR:  Subjective history: Has intermittent h/o vertigo  Non-Vestibular symptoms:  none  Type of dizziness: Spinning/Vertigo, Unsteady with head/body turns, and Lightheadedness/Faint  Frequency: daily   Duration: seconds to minutes  Aggravating factors: Worse in the morning and with getting up after sitting for longer periods  Relieving factors: unsure  Progression of symptoms: unchanged OCULOMOTOR EXAM:  Ocular Alignment: normal  Ocular ROM: No Limitations  Spontaneous Nystagmus: absent  Gaze-Induced Nystagmus: absent  Smooth Pursuits: intact but notes target  seems "out of reach"   Saccades: intact VESTIBULAR - OCULAR REFLEX:   Slow VOR: Comment: 2 reps provokes a sensation of bad spinning  Head-Impulse Test:  Did not test today due to inability to tolerate slow VOR  Dynamic Visual Acuity: to be assessed POSITIONAL TESTING: Right Dix-Hallpike: no nystagmus Left Dix-Hallpike: no nystagmus Right Roll Test: TBA Left Roll Test: TBA Right Sidelying: no nystagmus Left Sidelying: no nystagmus *Notes symptoms with return to sitting.    [facility] Adult PT Treatment:                                                DATE: 07/19/23 Therapeutic Exercise: UT stretch x 30 sec B Levator scap stretch x 30 sec B Manual Therapy: STM  & TPR B suboccipitals, cervical paraspinals, upper traps Neuromuscular re-ed: Dix-hallpike (-) bilat Habituation Supine head turns x10  Supine head nods x10 Gaze stabilization Seated VOR x 1 horizontal and  vertical x30 sec each at 100 BPM and then 110 BPM Smooth pursuit 2x30 sec horizontal and vertical 100 - 110 BPM Saccades 2x30 sec horizontal and vertical Vitals: Seated: 114/67 BP, HR 67   [facility] Adult PT Treatment:                                                DATE: 06/29/2023 Therapeutic Exercise: Seated: green SB roll out flexion stretch Dynamic side bend stretches --> gradually adding cervical flexion range  Neuromuscular re-ed: Habituation: Seated forward bending + sitting up looking up (discontinued d/t back pain) Walking + slow head turns  Standing in corner with chair in front: mREO yaw/pitch head turns --> head turns both planes mREC static standing 3x30"    [facility] Adult PT Treatment:                                                DATE: 06/26/2023 Neuromuscular re-ed: Seated: Habituation with horizontal head turns 3" hold each way x 30" Habituation with vertical head turns 3" hold each way x 30" VORx1 yaw/pitch pane 3x30" each Walking slow head turns R/center/L --> gradual increase in speed R/L Therapeutic  Activity: Fall recovery: transfer supine --> standing with UE support from mat table Eccentric eccentric step down (L SLS) Side stepping GTB crossed at ankles, gaze upright   PATIENT EDUCATION: Education details: Updated HEP Person educated: Patient Education method: Explanation Education comprehension: verbalized understanding  HOME EXERCISE PROGRAM: Access Code: [email or web address] URL: [email or web address] Date: 06/29/2023 Prepared by: Carlynn Herald  Exercises - Sit to Stand with Armchair  - 2 x daily - 7 x weekly - 1 sets - 5 reps - Standing with Head Rotation  - 2 x daily - 7 x weekly - 1 sets - 10 reps - Seated Gaze Stabilization with Head Rotation  - 2 x daily - 7 x weekly - 1 sets - 10 reps - Seated Single Leg Hip Abduction with Resistance  - 1 x daily - 7 x weekly - 3 sets - 10 reps - Supine Lower Trunk Rotation  - 2 x daily - 7 x weekly - 1 sets - 10 reps - Standing Balance in Corner  - 1 x daily - 7 x weekly - 3 sets - 10 reps - Standing Balance in Corner with Eyes Closed  - 1 x daily - 7 x weekly - 3 sets - 10 reps - Corner Balance Feet Apart: Eyes Open With Head Turns  - 1 x daily - 7 x weekly - 3 sets - 10 reps - Standing with Head Nod  - 1 x daily - 7 x weekly - 3 sets - 10 reps  Access Code: [email or web address] URL: [email or web address] Date: 07/19/2023 Prepared by: Vernon Prey April Kirstie Peri  Exercises - Seated Horizontal Smooth Pursuit  - 1 x daily - 7 x weekly - 2 sets - 30 sec hold - Seated Vertical Smooth Pursuit  - 1 x daily - 7 x weekly - 2 sets - 30 sec hold - Seated Vertical Saccades  - 1 x daily - 7 x weekly - 2 sets - 30 sec hold - Seated Horizontal Saccades  - 1 x daily - 7 x weekly - 2 sets -  30 sec hold - Seated Upper Trapezius Stretch  - 1 x daily - 7 x weekly - 2 sets - 30 sec hold - Gentle Levator Scapulae Stretch  - 1 x daily - 7 x weekly - 2 sets - 30 sec hold  GOALS: Goals reviewed with patient? Yes  SHORT TERM GOALS: Target  date: 06/06/23  Patient will be independent with initial HEP.  Baseline: To be given Goal status: MET  2.  Patient will complete Berg, and FGA if indicated. Baseline: 40/56 (Berg) Goal status: MET   3.  Patient will report decreased dizziness by 50%. Baseline: n/a Goal status: MET  LONG TERM GOALS:  Target date: 08/30/2023   Patient will be independent with advanced/ongoing HEP to improve outcomes and carryover.  Baseline: to be established. Goal status: IN PROGRESS  2.  Patient will improve Berg balance score by 5 points to demo improved functional balance.  Baseline: 40/56; Berg=50/56  Goal status: MET ON 06/01/23  3. Patient will demo floor<>stand with UE support mod indep for improved functional strength.  Baseline: has h/o falls.  Goal status: MET  4. Patient will improve gait speed to 2.8 ft/sec to demo improving mobility and dec'd risk for falls.  Baseline: 3.44 ft/sec  Goal status: MET ON 06/01/23  ASSESSMENT:  CLINICAL IMPRESSION:  Pt with increased cervical tightness affecting head turns/head nods. Reports decreased dizziness with head turns by end of session after performing manual and stretching. Continued to work on improving pt's dizziness with habituation and gaze stabilization. Pt demos difficulty with performing smooth pursuit at higher amplitudes. Performed most of exercises in seated today due to pt with continued L foot/ankle pain. Pt has been meeting her LTGs. Pt would benefit from continued PT to work on her dizziness and unsteadiness.    OBJECTIVE IMPAIRMENTS: Abnormal gait, decreased activity tolerance, decreased balance, decreased strength, dizziness, impaired sensation, and pain.   PLAN:  PT FREQUENCY: 2x/week  PT DURATION: 6 weeks  PLANNED INTERVENTIONS: Therapeutic exercises, Therapeutic activity, Neuromuscular re-education, Balance training, Gait training, Patient/Family education, Self Care, Joint mobilization, Vestibular training, Canalith  repositioning, Aquatic Therapy, Dry Needling, Electrical stimulation, Moist heat, Taping, and Manual therapy  PLAN FOR NEXT SESSION: Multi-tasking activities. Progress standing corner balance (narrowing BOS with head turns, EC with static head turns), single leg control, turning towards the right. Progress gait activities to tolerance, LE strength, balance   April Dell Ponto, PT 07/19/2023 3:27 PM
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
## 2023-07-24 ENCOUNTER — Ambulatory Visit: Payer: Medicare Other | Admitting: Nurse Practitioner

## 2023-07-24 ENCOUNTER — Encounter: Payer: Self-pay | Admitting: Nurse Practitioner

## 2023-07-24 VITALS — BP 122/68 | HR 67 | Temp 98.1°F | Ht 65.0 in | Wt 122.2 lb

## 2023-07-24 DIAGNOSIS — J4489 Other specified chronic obstructive pulmonary disease: Secondary | ICD-10-CM

## 2023-07-24 DIAGNOSIS — J309 Allergic rhinitis, unspecified: Secondary | ICD-10-CM | POA: Diagnosis not present

## 2023-07-24 NOTE — Assessment & Plan Note (Addendum)
Resolved bronchitic symptoms. Clinically improved. Lengthy discussion surrounding the role of maintenance therapy and COPD/asthma. She was agreeable to continue with this. Action plan in place. Encouraged to remain active.  Patient Instructions  Continue Albuterol inhaler 2 puffs or 3 mL every 6 hours as needed for shortness of breath or wheezing. Use neb treatments at least twice a day then use flutter valve ten times daily  Continue flonase nasal spray 2 sprays each nostril daily Continue Advair 1 puff Twice daily. Brush tongue and rinse mouth afterwards Continue claritin 1 tab daily Continue protonix 1 tab Twice daily  Continue Guaifenesin 600 mg Twice daily for cough/chest congestion   Follow up in 3 months with [name] or [name]. If symptoms do not improve or worsen, please contact office for sooner follow up or seek emergency care.

## 2023-07-24 NOTE — Patient Instructions (Signed)
Continue Albuterol inhaler 2 puffs or 3 mL every 6 hours as needed for shortness of breath or wheezing. Use neb treatments at least twice a day then use flutter valve ten times daily  Continue flonase nasal spray 2 sprays each nostril daily Continue Advair 1 puff Twice daily. Brush tongue and rinse mouth afterwards Continue claritin 1 tab daily Continue protonix 1 tab Twice daily  Continue Guaifenesin 600 mg Twice daily for cough/chest congestion   Follow up in 3 months with Dr. Delton Coombes or Philis Nettle. If symptoms do not improve or worsen, please contact office for sooner follow up or seek emergency care.

## 2023-07-24 NOTE — Progress Notes (Signed)
@Patient  ID: [name], female    DOB: 1946-09-17, 77 y.o.   MRN: [id number]  Chief Complaint  Patient presents with   Follow-up    States feeling improved, doesn't understand why she's taking inhalers, only sob with stairs    Referring provider: Bradd Canary, MD  HPI: 77 year old female, former smoker followed for asthma and allergies. She is a patient of Dr. Kavin Leech and last seen in office 12/05/2022. Past medical history significant HTN, GERD, vocal cord hemangioma, DDD, anxiety, breast cancer, HLD, insomnia, RLS, sjogren's disease on Plaquenil.   TEST/EVENTS:  08/18/2021 PFT: FVC 50, FEV1 45, ratio 63, TLC 93, DLCOcor 73. Severe obstruction with reversibility and moderate diffusing defect  06/14/2023 CTA chest: atherosclerosis. Small hiatal hernia. Mild clustered nodularity in the medial left lung.  07/05/2023 eos 400   12/05/2022: OV with Dr. Delton Coombes. Last seen 08/2022. Persistent symptoms so was started on Flovent. Improved today. Able to stop flovent. Doing well until she was exposed to air freshener so restarted the flovent again. On good allergy regimen. Would like to be off of scheduled inhaled medicine if possible. Ok to retry off and avoid triggers. If persistent symptoms remain, will consider imaging.  07/11/2023: OV with Dania Marsan NP for acute visit. She had viral symptoms in the beginning of June and was treated for acute bronchitis by her PCP with steroid injection and prednisone burst 6/7. Flu and COVID testing at the time was negative. She went back to her PCP office on 6/14 with return of cough, dyspnea, and wheezing. CXR without acute process. She was treated with azithromycin for atypical pathogen. She failed to improve and on 6/20, felt like she couldn't breath and had low O2 levels down to 82%. She went to the ED for further evaluation. She had a positive d dimer so she had a CTA which was negative for PE but revealed mild clustered nodularity in the medial left lung base.  She was treated with rocephin x1, steroid injection, and advised to start high dose Advair.  Today, she tells me that the congestion in her chest is some better but her cough still hasn't improved. She's producing brown phlegm and sometimes it hurts to cough. She is still feeling short winded and noticing wheezing at times. She feels very run down. Can't seem to get her energy back. She denies any fevers, chills, night sweats, hemoptysis, leg swelling, orthopnea, calf pain. Eating and drinking well. She has not noticed any low O2 levels. She never started the Advair she was prescribed. She did feel better for a few days after her ED visit but she was also worried about starting a new inhaled medication without discussing with [place]. She has not been using her rescue inhaler. Has used her flovent sporadically. No OTC medications.   07/24/2023: Today - follow up Patient presents today for follow-up.  She was treated for unresolved acute bronchitis at her last visit with 10-day course of cefdinir and prednisone taper.  She was also started on maintenance therapy with Advair.  She does not that she is feeling significantly better today.  Her cough has resolved.  She finished prednisone a few days ago.  Feels like her breathing is back to her normal.  Not having any chest congestion or wheezing.  She wants to know if she needs to continue the Advair or not.  She denies any fevers, chills, hemoptysis.  Has not had to use her rescue inhaler over the past week or so.  Allergies  Allergen Reactions   Clindamycin Hcl Shortness Of Breath and Rash   Penicillins Anaphylaxis   Rosuvastatin Anaphylaxis   Baclofen Other (See Comments) and Rash   Lincomycin Other (See Comments)   Prednisone Rash    Other reaction(s): Rash, Hives - can tolerate if she takes with benadryl    Codeine Hives and Other (See Comments)    headache Other reaction(s): Headache, Nausea, headache   Doxycycline     Skin rash   Erythromycin Hives    Haemophilus Influenzae Other (See Comments)    Local reaction at the site Local reaction at the site   Haemophilus Influenzae Vaccines Other (See Comments)    Local reaction at site   Latex Hives   Levofloxacin     Other reaction(s): sick   Other Other (See Comments)    Father had malignant hyperthermia   Pentazocine Lactate Other (See Comments)    HALLUCINATION   Pneumococcal Vaccine Polyvalent Hives, Swelling and Other (See Comments)    REACTION: redness, swelling, and hives at injection site   Tamoxifen Nausea And Vomiting and Other (See Comments)    HEADACHE Other reaction(s): Headache, Nausea   Fluzone [Influenza Virus Vaccine] Hives    Local reaction at the site    Immunization History  Administered Date(s) Administered   Influenza Split 10/10/2011, 09/24/2012   Influenza Whole 10/18/2010   Influenza,inj,Quad PF,6+ Mos 10/13/2013, 09/25/2014   PFIZER(Purple Top)SARS-COV-2 Vaccination 01/29/2020, 02/23/2020, 09/18/2020, 07/25/2021   Pneumococcal Polysaccharide-23 01/27/2011   Tdap 10/21/2013, 06/01/2018   Zoster Recombinant(Shingrix) 07/14/2019, 10/16/2019    Past Medical History:  Diagnosis Date   Allergy    Anemia    Anxiety    past hx    Arthritis    Asthma    Atrial flutter (HCC)    past history- not current   Breast cancer (HCC)    CAD (coronary artery disease) CARDIOLOGIST--  DR Clifton James   mild non-obstructive cad   Cancer (HCC)    right   Cataract    bilaterally removed    Chronic constipation    Chronic kidney disease    stage 3 ckd, interstitial cystitis   Concussion    x 3   COPD (chronic obstructive pulmonary disease) (HCC)    Depression    past hx    Dyspnea    Dysrhythmia    Family history of adverse reaction to anesthesia    Family history of malignant hyperthermia    father had this   Fibromyalgia    Frequency of urination    GERD (gastroesophageal reflux disease)    H/O hiatal hernia    History of basal cell carcinoma excision     X2   History of breast cancer ONCOLOGIST-- DR Darnelle Catalan---  NO RECURRANCE   DX 07/2012;  LOW GRADE DCIS  ER+PR+  ----  S/P RIGHT LUMPECTOMY WITH NEGATIVE MARGINS/   RADIATION ENDED 11/2012   History of chronic bronchitis    History of colonic polyps    History of tachycardia    CONTROLLED  WITH ATENOLOL   Hyperlipidemia    Hypertension    Neuromuscular disorder (HCC)    fibromyalgia   Neuropathy    Osteoporosis 01/2019   T score -2.2 stable/improved from prior study   Pelvic pain    Personal history of radiation therapy    Pneumonia    S/P radiation therapy 11/12/12 - 12/05/12   right Breast   Sepsis (HCC) 2014   from UTI    Sinus headache  Sjogren's disease (HCC)    Urgency of urination     Tobacco History: Social History   Tobacco Use  Smoking Status Former   Current packs/day: 0.00   Average packs/day: 1 pack/day for 15.0 years (15.0 ttl pk-yrs)   Types: Cigarettes   Start date: 05/27/1990   Quit date: 05/27/2005   Years since quitting: 18.1  Smokeless Tobacco Never   Counseling given: Not Answered   Outpatient Medications Prior to Visit  Medication Sig Dispense Refill   acetaminophen (TYLENOL) 500 MG tablet Take 1,000 mg by mouth every 6 (six) hours as needed for mild pain or headache.      albuterol (VENTOLIN HFA) 108 (90 Base) MCG/ACT inhaler Inhale 2 puffs into the lungs every 4 (four) hours as needed for wheezing or shortness of breath (coughing fits). 18 g 1   amLODipine (NORVASC) 5 MG tablet Take 1 tablet (5 mg total) by mouth daily. 90 tablet 3   aspirin EC 81 MG tablet Take 1 tablet (81 mg total) by mouth daily. 90 tablet 3   atenolol (TENORMIN) 25 MG tablet Take 1 tablet (25 mg total) by mouth 2 (two) times daily. Take 1 tablet by mouth 2 time a day 180 tablet 3   CALCIUM PO Take 500 mg by mouth daily.     carboxymethylcellul-glycerin (OPTIVE) 0.5-0.9 % ophthalmic solution Place 1 drop into both eyes daily as needed for dry eyes.     Cholecalciferol  (VITAMIN D3) 50 MCG (2000 UT) CAPS Take 2,000 Units by mouth daily.     cyanocobalamin (VITAMIN B12) 1000 MCG tablet Take 1,000 mg by mouth daily with breakfast.     diclofenac Sodium (VOLTAREN) 1 % GEL Apply 2 g topically daily as needed (Back pain).     diphenhydrAMINE (BENADRYL) 25 mg capsule Take 25 mg by mouth every 6 (six) hours as needed for itching.     Docusate Sodium (DSS) 100 MG CAPS Take 100 mg by mouth daily as needed (Constipation).     DULoxetine (CYMBALTA) 60 MG capsule Take 1 capsule by mouth twice daily 180 capsule 0   famotidine (PEPCID) 20 MG tablet Take 1 tablet (20 mg total) by mouth 2 (two) times daily as needed for heartburn or indigestion. 60 tablet 1   fluticasone (FLONASE) 50 MCG/ACT nasal spray Use 2 spray(s) in each nostril once daily (Patient taking differently: Place 2 sprays into both nostrils daily as needed for allergies or rhinitis.) 16 g 5   fluticasone (FLOVENT HFA) 110 MCG/ACT inhaler Inhale 2 puffs into the lungs 2 (two) times daily. (Patient taking differently: Inhale 2 puffs into the lungs daily as needed (Short of breath).) 1 each 5   fluticasone-salmeterol (ADVAIR) 250-50 MCG/ACT AEPB Inhale 1 puff into the lungs in the morning and at bedtime. 1 each 2   folic acid (FOLVITE) 1 MG tablet Take 1 tablet (1 mg total) by mouth daily.     furosemide (LASIX) 20 MG tablet Take 1 tablet (20 mg total) by mouth as needed for edema. 90 tablet 3   gabapentin (NEURONTIN) 300 MG capsule Take 300-900 mg by mouth See admin instructions. Take 600 mg in the morning 300 mg at lunch time and 900 mg at bedtime     hydrOXYzine (ATARAX) 10 MG tablet Take 1 tablet (10 mg total) by mouth 3 (three) times daily as needed. 30 tablet 1   linaclotide (LINZESS) 145 MCG CAPS capsule Take 1 capsule (145 mcg total) by mouth daily as needed. Lot: [id number]  Exp:11-2023 12 capsule 0   loratadine (CLARITIN) 10 MG tablet Take 10 mg by mouth daily as needed for rhinitis.     Multiple  Vitamins-Minerals (MULTIVITAMIN WITH MINERALS) tablet Take 1 tablet by mouth daily. (Patient taking differently: Take 1 tablet by mouth daily. 50+)     nitroGLYCERIN (NITROSTAT) 0.4 MG SL tablet Place 1 tablet (0.4 mg total) under the tongue every 5 (five) minutes as needed for chest pain. 25 tablet 1   nystatin cream (MYCOSTATIN) Apply 1 application topically 2 (two) times daily. (Patient taking differently: Apply 1 application  topically daily as needed (vaginal yeast).) 30 g 2   pantoprazole (PROTONIX) 40 MG tablet Take 1 tablet (40 mg total) by mouth 2 (two) times daily. 90 tablet 1   pentosan polysulfate (ELMIRON) 100 MG capsule Take 1 capsule (100 mg total) by mouth 3 (three) times daily. Reported on 01/05/2016 (Patient taking differently: Take 100 mg by mouth daily as needed (burning and pain). Reported on 01/05/2016) 90 capsule 11   polyethylene glycol (MIRALAX) 17 g packet Take 17 g by mouth 2 (two) times daily. 14 each 0   REPATHA SURECLICK 140 MG/ML SOAJ INJECT 140 MG INTO THE SKIN EVERY 14 DAYS 6 mL 0   sucralfate (CARAFATE) 1 GM/10ML suspension Take 10 mLs (1 g total) by mouth 4 (four) times daily -  with meals and at bedtime. (Patient taking differently: Take 1 g by mouth 2 (two) times daily.) 420 mL 1   traMADol (ULTRAM) 50 MG tablet Take 25 mg by mouth 2 (two) times daily.     traZODone (DESYREL) 100 MG tablet TAKE 1 TABLET BY MOUTH AT BEDTIME 90 tablet 0   Vitamin D, Ergocalciferol, (DRISDOL) 1.25 MG (50000 UNIT) CAPS capsule Take 1 capsule (50,000 Units total) by mouth every 7 (seven) days. 12x Weeks 5 capsule 3   EPINEPHrine 0.3 mg/0.3 mL IJ SOAJ injection Inject 0.3 mg into the muscle as needed for anaphylaxis. (Patient not taking: Reported on 07/24/2023) 2 each 1   amoxicillin (AMOXIL) 250 MG/5ML suspension Do NOT take at home. Bring to Dr. Elmyra Ricks office for drug challenge. 80 mL 0   predniSONE (DELTASONE) 10 MG tablet 4 tabs for 2 days, then 3 tabs for 2 days, 2 tabs for 2 days, then 1  tab for 2 days, then stop 20 tablet 0   No facility-administered medications prior to visit.     Review of Systems:   Constitutional: No weight loss or gain, night sweats, fevers, chills, or lassitude, fatigue  HEENT: No headaches, difficulty swallowing, tooth/dental problems, or sore throat. No sneezing, itching, ear ache, nasal congestion, or post nasal drip CV:  No chest pain, orthopnea, PND, swelling in lower extremities, anasarca, dizziness, palpitations, syncope Resp: +shortness of breath with exertion (improved; baseline). No wheezing. No cough. No hemoptysis.  No chest wall deformity GI:  No heartburn, indigestion, abdominal pain, nausea, vomiting, diarrhea, change in bowel habits, loss of appetite, bloody stools.  GU: No dysuria, change in color of urine, urgency or frequency.   Skin: No rash, lesions, ulcerations MSK:  No joint pain or swelling.   Neuro: No dizziness or lightheadedness.  Psych: No depression or anxiety. Mood stable.     Physical Exam:  BP 122/68 (BP Location: Left Arm, Patient Position: Sitting, Cuff Size: Normal)   Pulse 67   Temp 98.1 F (36.7 C) (Oral)   Ht 5\' 5"  (1.651 m)   Wt 122 lb 3.2 oz (55.4 kg)   SpO2 98%  BMI 20.34 kg/m   GEN: Pleasant, interactive, well-kempt; in no acute distress. HEENT:  Normocephalic and atraumatic. PERRLA. Sclera white. Nasal turbinates pink, moist and patent bilaterally. No rhinorrhea present. Oropharynx pink and moist, without exudate or edema. No lesions, ulcerations, or postnasal drip.  NECK:  Supple w/ fair ROM. No JVD present. Normal carotid impulses w/o bruits. Thyroid symmetrical with no goiter or nodules palpated. No lymphadenopathy.   CV: RRR, no m/r/g, no peripheral edema. Pulses intact, +2 bilaterally. No cyanosis, pallor or clubbing. PULMONARY:  Unlabored, regular breathing. Clear bilaterally A&P w/o wheezes/rales/rhonchi. No accessory muscle use.  GI: BS present and normoactive. Soft, non-tender to  palpation. No organomegaly or masses detected. MSK: No erythema, warmth or tenderness. Cap refil <2 sec all extrem. No deformities or joint swelling noted.  Neuro: A/Ox3. No focal deficits noted.   Skin: Warm, no lesions or rashe Psych: Normal affect and behavior. Judgement and thought content appropriate.     Lab Results:  CBC    Component Value Date/Time   WBC 6.7 07/05/2023 0904   RBC 4.02 07/05/2023 0904   HGB 11.0 (L) 07/05/2023 0904   HGB 11.8 05/25/2023 1231   HGB 11.3 (L) 04/20/2015 1050   HCT 35.6 (L) 07/05/2023 0904   HCT 36.3 05/25/2023 1231   HCT 36.0 04/20/2015 1050   PLT 360.0 07/05/2023 0904   PLT 348 05/25/2023 1231   MCV 88.7 07/05/2023 0904   MCV 85 05/25/2023 1231   MCV 90.9 04/20/2015 1050   MCH 27.6 06/14/2023 1643   MCHC 31.0 07/05/2023 0904   RDW 18.4 (H) 07/05/2023 0904   RDW 15.8 (H) 05/25/2023 1231   RDW 15.9 (H) 04/20/2015 1050   LYMPHSABS 1.3 07/05/2023 0904   LYMPHSABS 2.1 05/25/2023 1231   LYMPHSABS 1.3 04/20/2015 1050   MONOABS 0.5 07/05/2023 0904   MONOABS 0.5 04/20/2015 1050   EOSABS 0.4 07/05/2023 0904   EOSABS 0.5 (H) 05/25/2023 1231   BASOSABS 0.1 07/05/2023 0904   BASOSABS 0.1 05/25/2023 1231   BASOSABS 0.0 04/20/2015 1050    BMET    Component Value Date/Time   NA 135 06/14/2023 1643   NA 138 05/25/2023 1231   NA 140 04/20/2015 1050   K 3.6 06/14/2023 1643   K 3.9 04/20/2015 1050   CL 100 06/14/2023 1643   CL 103 11/12/2012 1549   CO2 25 06/14/2023 1643   CO2 26 04/20/2015 1050   GLUCOSE 147 (H) 06/14/2023 1643   GLUCOSE 82 04/20/2015 1050   GLUCOSE 103 (H) 11/12/2012 1549   BUN 17 06/14/2023 1643   BUN 13 05/25/2023 1231   BUN 13.1 04/20/2015 1050   CREATININE 1.17 (H) 06/14/2023 1643   CREATININE 1.1 04/20/2015 1050   CALCIUM 8.3 (L) 06/14/2023 1643   CALCIUM 8.9 04/20/2015 1050   GFRNONAA 48 (L) 06/14/2023 1643   GFRNONAA 51 (L) 07/27/2014 1227   GFRAA 66 09/03/2018 1114   GFRAA 59 (L) 07/27/2014 1227     BNP No results found for: "BNP"   Imaging:  DG Chest 2 View  Result Date: 07/11/2023 CLINICAL DATA:  Productive cough, concern for pneumonia EXAM: CHEST - 2 VIEW COMPARISON:  06/14/2023 FINDINGS: Cardiac and mediastinal contours are within normal limits. Hyperinflated lungs with mild biapical scarring. No focal pulmonary opacity. No pleural effusion or pneumothorax. No acute osseous abnormality. IMPRESSION: No acute cardiopulmonary process.  COPD. Electronically Signed   By: Wiliam Ke M.D.   On: 07/11/2023 12:47    ipratropium-albuterol (DUONEB) 0.5-2.5 (3) MG/3ML  nebulizer solution 3 mL     Date Action Dose Route User   Discharged on 06/14/2023   Admitted on 06/14/2023   06/07/2023 1140 Given 3 mL Nebulization Cheek, Yolanda R, CMA      methylPREDNISolone acetate (DEPO-MEDROL) injection 80 mg     Date Action Dose Route User   07/11/2023 1158 Given 80 mg Intramuscular (Right Upper Outer Quadrant) Christen Butter, CMA      methylPREDNISolone sodium succinate (SOLU-MEDROL) 40 mg/mL injection 40 mg     Date Action Dose Route User   Discharged on 06/14/2023   Admitted on 06/14/2023   06/07/2023 1140 Given 40 mg Intramuscular (Left Ventrogluteal) Cheek, Yolanda R, CMA          Latest Ref Rng & Units 08/18/2021    8:39 AM  PFT Results  FVC-Pre L 1.42   FVC-Predicted Pre % 50   FVC-Post L 1.94   FVC-Predicted Post % 68   Pre FEV1/FVC % % 68   Post FEV1/FCV % % 63   FEV1-Pre L 0.96   FEV1-Predicted Pre % 45   FEV1-Post L 1.22   DLCO uncorrected ml/min/mmHg 13.72   DLCO UNC% % 70   DLCO corrected ml/min/mmHg 14.33   DLCO COR %Predicted % 73   DLVA Predicted % 115   TLC L 4.80   TLC % Predicted % 93   RV % Predicted % 127     No results found for: "NITRICOXIDE"      Assessment & Plan:   COPD with asthma Resolved bronchitic symptoms. Clinically improved. Lengthy discussion surrounding the role of maintenance therapy and COPD/asthma. She was agreeable to  continue with this. Action plan in place. Encouraged to remain active.  Patient Instructions  Continue Albuterol inhaler 2 puffs or 3 mL every 6 hours as needed for shortness of breath or wheezing. Use neb treatments at least twice a day then use flutter valve ten times daily  Continue flonase nasal spray 2 sprays each nostril daily Continue Advair 1 puff Twice daily. Brush tongue and rinse mouth afterwards Continue claritin 1 tab daily Continue protonix 1 tab Twice daily  Continue Guaifenesin 600 mg Twice daily for cough/chest congestion   Follow up in 3 months with Dr. Delton Coombes or Philis Nettle. If symptoms do not improve or worsen, please contact office for sooner follow up or seek emergency care.    Allergic rhinitis Compensated on current regimen     I spent 32 minutes of dedicated to the care of this patient on the date of this encounter to include pre-visit review of records, face-to-face time with the patient discussing conditions above, post visit ordering of testing, clinical documentation with the electronic health record, making appropriate referrals as documented, and communicating necessary findings to members of the patients care team.  Noemi Chapel, NP 07/24/2023  Pt aware and understands NP's role.

## 2023-07-24 NOTE — Assessment & Plan Note (Signed)
Compensated on current regimen

## 2023-07-25 ENCOUNTER — Encounter: Payer: Self-pay | Admitting: Physical Therapy

## 2023-07-25 ENCOUNTER — Ambulatory Visit: Payer: Medicare Other | Admitting: Physical Therapy

## 2023-07-25 DIAGNOSIS — H8112 Benign paroxysmal vertigo, left ear: Secondary | ICD-10-CM | POA: Diagnosis not present

## 2023-07-25 DIAGNOSIS — M6281 Muscle weakness (generalized): Secondary | ICD-10-CM | POA: Diagnosis not present

## 2023-07-25 DIAGNOSIS — M542 Cervicalgia: Secondary | ICD-10-CM | POA: Diagnosis not present

## 2023-07-25 DIAGNOSIS — R2681 Unsteadiness on feet: Secondary | ICD-10-CM

## 2023-07-25 DIAGNOSIS — G8929 Other chronic pain: Secondary | ICD-10-CM | POA: Diagnosis not present

## 2023-07-25 DIAGNOSIS — R278 Other lack of coordination: Secondary | ICD-10-CM | POA: Diagnosis not present

## 2023-07-25 DIAGNOSIS — R42 Dizziness and giddiness: Secondary | ICD-10-CM

## 2023-07-25 DIAGNOSIS — M545 Low back pain, unspecified: Secondary | ICD-10-CM | POA: Diagnosis not present

## 2023-07-25 DIAGNOSIS — R252 Cramp and spasm: Secondary | ICD-10-CM | POA: Diagnosis not present

## 2023-07-25 NOTE — Therapy (Signed)
OUTPATIENT PHYSICAL THERAPY NEURO TREATMENT AND RE-CERT   Patient Name: [name] MRN: [id number] DOB:1946/02/26, 77 y.o., female Today's Date: 07/25/2023  PCP: Danise Edge, MD REFERRING PROVIDER: Danise Edge, MD END OF SESSION:    Visit Number 12    Number of Visits 16     Date for PT Re-Evaluation 07/05/23     Authorization Type [employer] medicare     PT End of Session - 07/25/23 1317     Visit Number 12    Number of Visits 16    Authorization Type [employer] medicare    PT Start Time 1317    PT Stop Time 1400    PT Time Calculation (min) 43 min    Activity Tolerance Patient tolerated treatment well    Behavior During Therapy WFL for tasks assessed/performed            Past Medical History:  Diagnosis Date   Allergy    Anemia    Anxiety    past hx    Arthritis    Asthma    Atrial flutter (HCC)    past history- not current   Breast cancer (HCC)    CAD (coronary artery disease) CARDIOLOGIST--  DR Clifton James   mild non-obstructive cad   Cancer (HCC)    right   Cataract    bilaterally removed    Chronic constipation    Chronic kidney disease    stage 3 ckd, interstitial cystitis   Concussion    x 3   COPD (chronic obstructive pulmonary disease) (HCC)    Depression    past hx    Dyspnea    Dysrhythmia    Family history of adverse reaction to anesthesia    Family history of malignant hyperthermia    father had this   Fibromyalgia    Frequency of urination    GERD (gastroesophageal reflux disease)    H/O hiatal hernia    History of basal cell carcinoma excision    X2   History of breast cancer ONCOLOGIST-- DR Darnelle Catalan---  NO RECURRANCE   DX 07/2012;  LOW GRADE DCIS  ER+PR+  ----  S/P RIGHT LUMPECTOMY WITH NEGATIVE MARGINS/   RADIATION ENDED 11/2012   History of chronic bronchitis    History of colonic polyps    History of tachycardia    CONTROLLED  WITH ATENOLOL   Hyperlipidemia    Hypertension    Neuromuscular disorder (HCC)    fibromyalgia    Neuropathy    Osteoporosis 01/2019   T score -2.2 stable/improved from prior study   Pelvic pain    Personal history of radiation therapy    Pneumonia    S/P radiation therapy 11/12/12 - 12/05/12   right Breast   Sepsis (HCC) 2014   from UTI    Sinus headache    Sjogren's disease (HCC)    Urgency of urination    Past Surgical History:  Procedure Laterality Date   65 HOUR PH STUDY N/A 04/03/2016   Procedure: 24 HOUR PH STUDY;  Surgeon: Ruffin Frederick, MD;  Location: [facility];  Service: Gastroenterology;  Laterality: N/A;   BREAST BIOPSY Right 08/23/2012   ADH   BREAST BIOPSY Right 10/11/2012   Ductal Carcinoma   BREAST EXCISIONAL BIOPSY Right 09/04/2013   benign   BREAST LUMPECTOMY Right 10/11/2012   W/ SLN BX   CARDIAC CATHETERIZATION  09-13-2007  DR Riley Kill   WELL-PRESERVED LVF/  DIFFUSE SCATTERED CORONARY CALCIFACATION AND ATHEROSCLEROSIS WITHOUT OBSTRUCTION   CARDIAC  CATHETERIZATION  08-04-2010  DR MCALHANY   NON-OBSTRUCTIVE CAD/  pLAD 40%/  oLAD 30%/  mLAD 30%/  pRCA 30%/  EF 60%   CARDIOVASCULAR STRESS TEST  06-18-2012  DR McALHANY   LOW RISK NUCLEAR STUDY/  SMALL FIXED AREA OF MODERATELY DECREASED UPTAKE IN ANTEROSEPTAL WALL WHICH MAY BE ARTIFACTUAL/  NO ISCHEMIA/  EF 68%   COLONOSCOPY  09/29/2010   CYSTOSCOPY     CYSTOSCOPY WITH HYDRODISTENSION AND BIOPSY N/A 03/06/2014   Procedure: CYSTOSCOPY/HYDRODISTENSION/ INSTILATION OF MARCAINE AND PYRIDIUM;  Surgeon: Kathi Ludwig, MD;  Location: [facility] [place];  Service: Urology;  Laterality: N/A;   CYSTOSCOPY/URETEROSCOPY/HOLMIUM LASER/STENT PLACEMENT Left 03/21/2023   Procedure: CYSTOSCOPY LEFT RETROGRADE PYELOGRAM, LEFT URETEROSCOPY, HOLMIUM LASER LITHOTRIPSYM, AND LEFT URETERAL STENT PLACEMENT;  Surgeon: Rene Paci, MD;  Location: [facility];  Service: Urology;  Laterality: Left;  60 MINUTES   ESOPHAGEAL MANOMETRY N/A 04/03/2016   Procedure: ESOPHAGEAL MANOMETRY (EM);  Surgeon: Ruffin Frederick, MD;  Location: [facility];  Service: Gastroenterology;  Laterality: N/A;   EXTRACORPOREAL SHOCK WAVE LITHOTRIPSY Left 02/06/2019   Procedure: EXTRACORPOREAL SHOCK WAVE LITHOTRIPSY (ESWL);  Surgeon: Ihor Gully, MD;  Location: [facility];  Service: Urology;  Laterality: Left;   NASAL SINUS SURGERY  1985   ORIF RIGHT ANKLE  FX  2006   POLYPECTOMY     REMOVAL VOCAL CORD CYST  01/2013   RIGHT BREAST BX  08/23/2012   RIGHT HAND SURGERY  X3  LAST ONE 2009   INCLUDES  ORIF RIGHT 5TH FINGER AND REVISION TWICE   SKIN CANCER EXCISION     face,arms   TONSILLECTOMY AND ADENOIDECTOMY  AGE 52   TOTAL ABDOMINAL HYSTERECTOMY W/ BILATERAL SALPINGOOPHORECTOMY  1982   W/  APPENDECTOMY   TRANSTHORACIC ECHOCARDIOGRAM  06/24/2012   GRADE I DIASTOLIC DYSFUNCTION/  EF 55-60%/  MILD MR   UPPER GASTROINTESTINAL ENDOSCOPY     Patient Active Problem List   Diagnosis Date Noted   Multiple drug allergies 05/09/2023   Recurrent infections 05/09/2023   Rash and other nonspecific skin eruption 05/09/2023   Local reaction to hymenoptera sting 05/09/2023   Allergic reaction 05/09/2023   Other adverse food reactions, not elsewhere classified, subsequent encounter 05/09/2023   Weight loss 05/01/2023   Common bile duct dilatation 02/13/2023   Oral herpes 02/05/2023   Tongue lesion 02/05/2023   Drug reaction 02/05/2023   Cellulitis of breast 02/05/2023   Elevated alkaline phosphatase level 01/25/2023   Facial trauma 01/22/2023   Vertigo 01/22/2023   Intertrigo 12/13/2022   Hyperkalemia 09/28/2022   Anemia 09/28/2022   Tremor 07/05/2022   RUQ pain 07/05/2022   Leukocytosis 07/05/2022   Chronic renal insufficiency 03/21/2022   Sjogren's disease (HCC) 12/12/2021   Vocal cord cyst 05/18/2021   High serum vitamin D 05/18/2021   Dysuria 03/09/2021   Vitamin B12 deficiency 03/08/2021   Preventative health care 03/08/2021   Hyperglycemia 03/08/2021   Low back pain 09/08/2020   OSA and COPD  overlap syndrome (HCC) 06/10/2020   Recurrent falls 05/02/2020   Statin intolerance 02/29/2020   RLS (restless legs syndrome) 11/09/2019   Kidney stone 02/26/2019   Recurrent sinusitis 02/25/2019   Hx of colonic polyp 02/25/2019   DDD (degenerative disc disease), cervical 09/17/2018   Degenerative scoliosis 04/22/2018   Primary insomnia 06/25/2017   Chronic interstitial cystitis 02/01/2017   Chronic cough 01/09/2017   Right arm pain 07/20/2016   Deviated septum 05/12/2016   Nasal turbinate hypertrophy 05/12/2016  Dysphagia    Allergic rhinitis 01/25/2016   Other and combined forms of senile cataract 07/15/2015   Dyspnea    Left hip pain 04/30/2015   Sciatica 04/30/2015   Knee pain, right 04/30/2015   Bilateral carpal tunnel syndrome 03/04/2015   Hyperlipidemia 03/01/2015   Pulmonary nodules 02/12/2015   External hemorrhoids 02/05/2015   Chronic night sweats 01/08/2015   Atypical chest pain 01/01/2015   Memory impairment 05/22/2014   Peripheral neuropathy 05/22/2014   Abdominal pain 10/26/2013   Headache 10/13/2013   Arthralgia 08/18/2013   ANA positive 07/29/2013   Depression 02/05/2013   Family history of malignant hyperthermia 02/05/2013   Malignant neoplasm of lower-outer quadrant of right breast of female, estrogen receptor positive (HCC) 01/03/2013   Splenic lesion 09/02/2012   Asthma, allergic 08/05/2012   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   GERD (gastroesophageal reflux disease) 06/03/2012   Edema 04/08/2012   Stricture and stenosis of esophagus 10/19/2011   Constipation 07/21/2011   Nonspecific (abnormal) findings on radiological and other examination of biliary tract 07/21/2011   Osteoporosis 04/17/2011   Leg pain 02/24/2011   FIBROCYSTIC BREAST DISEASE, HX OF 10/18/2010   Essential hypertension 08/25/2010   CAD in native artery 08/25/2010   TOBACCO ABUSE, HX OF 08/25/2010   GENERALIZED ANXIETY DISORDER 08/03/2010   Breast pain 01/11/2010   Fibromyalgia  01/11/2010    ONSET DATE: 04/24/23 REFERRING DIAG:  R42 (ICD-10-CM) - Dizzy spells  R53.1 (ICD-10-CM) - Weakness   THERAPY DIAG:  Unsteadiness on feet  Dizziness and giddiness  BPPV (benign paroxysmal positional vertigo), left  Cervicalgia  Rationale for Evaluation and Treatment: Rehabilitation  SUBJECTIVE:                                                                                                                                                                                           SUBJECTIVE STATEMENT: Pt reports difficulty doing her eye exercises. Pt states she doesn't feel her head is right. Pt reports her vision is not clear and her head feels wobbly. Pt states today is not a good day. Pt reports her lungs are clear for pneumonia. Pt reports feeling very overstimulated and dizzy while going to church. Does note improvement in her neck pain. Pt accompanied by: self  PERTINENT HISTORY: chronic back pain, (scheduled for back ablation in June 2024), h/o vertigo PAIN:  Are you having pain? Yes: NPRS scale: notes mild back pain Pain location: low back  Pain description: chronic Aggravating factors: PT will monitor Relieving factors: *has upcoming intervention scheduled PRECAUTIONS: Fall WEIGHT BEARING RESTRICTIONS: No FALLS: Has patient fallen in last 6 months? Yes. Number of falls  3 LIVING ENVIRONMENT: Lives with: lives with their spouse Lives in: House/apartment Stairs: One step with one rail PLOF: Independent with basic ADLs-- notes declining mobility.  PATIENT GOALS: "stop the spinning and be able to walk and not falter."  OBJECTIVE:  (Measures in this section from initial evaluation unless otherwise noted) COGNITION: Overall cognitive status:  Patient mentions memory changes since first fall where she hit her head -- she is unable to recall date SENSATION:  Patient reports numbness and cold sensations in feet. LOWER EXTREMITY MMT:   MMT Right Eval Left Eval   Hip flexion 3+/5 4/5  Knee flexion 4/5 4/5  Knee extension 5/5 5/5  Ankle dorsiflexion 3+/5 3+/5  Ankle plantarflexion    Ankle inversion    Ankle eversion    (Blank rows = not tested) BED MOBILITY:  Sit to supine Complete Independence Supine to sit Complete Independence Rolling to Right Complete Independence Rolling to Left Complete Independence *notes "I'm in a fog" with returning to sitting GAIT: Gait pattern:  slowed pace with SPC and wide BOS Distance walked: 100 ft Assistive device utilized: Single point cane FUNCTIONAL TESTS:  Berg= 40/56  UPDATE: 50/56 ON 06/01/23 5 time sit to stand=20.46 seconds with Ues on legs 34M=0.64 m/sec or 2.10 ft/sec  UPDATE: 3.44 ft/sec ON 06/01/23 PATIENT SURVEYS:  FOTO n/a--not measured  due to vertigo/balance  VESTIBULAR ASSESSMENT: GENERAL OBSERVATION: Gets intermittent dizziness SYMPTOM BEHAVIOR:  Subjective history: Has intermittent h/o vertigo  Non-Vestibular symptoms:  none  Type of dizziness: Spinning/Vertigo, Unsteady with head/body turns, and Lightheadedness/Faint  Frequency: daily   Duration: seconds to minutes  Aggravating factors: Worse in the morning and with getting up after sitting for longer periods  Relieving factors: unsure  Progression of symptoms: unchanged OCULOMOTOR EXAM:  Ocular Alignment: normal  Ocular ROM: No Limitations  Spontaneous Nystagmus: absent  Gaze-Induced Nystagmus: absent  Smooth Pursuits: intact but notes target seems "out of reach"   Saccades: intact VESTIBULAR - OCULAR REFLEX:   Slow VOR: Comment: 2 reps provokes a sensation of bad spinning  Head-Impulse Test:  Did not test today due to inability to tolerate slow VOR  Dynamic Visual Acuity: to be assessed POSITIONAL TESTING: Right Dix-Hallpike: no nystagmus Left Dix-Hallpike: no nystagmus Right Roll Test: TBA Left Roll Test: TBA Right Sidelying: no nystagmus Left Sidelying: no nystagmus *Notes symptoms with return to sitting.   [facility]  Adult PT Treatment:                                                DATE: 07/25/23 Neuromuscular re-ed: Smooth pursuit seated 3x30 sec [email or web address] 2x30 sec [email or web address] 2x30 sec [email or web address] Saccades seated 2x30 sec horizontal and vertical (post it note on wall) Seated VOR x 1 at 100 BPM, 110 BPM, 120 BPM x30 sec each horizontal and then vertical Self Care: Discussed progressions for home   [facility] Adult PT Treatment:                                                DATE: 07/19/23 Therapeutic Exercise: UT stretch x 30 sec B Levator scap stretch x 30 sec B Manual Therapy: STM  & TPR B suboccipitals, cervical paraspinals, upper traps Neuromuscular re-ed: Dix-hallpike (-) bilat Habituation Supine  head turns x10  Supine head nods x10 Gaze stabilization Seated VOR x 1 horizontal and vertical x30 sec each at 100 BPM and then 110 BPM Smooth pursuit 2x30 sec horizontal and vertical 100 - 110 BPM Saccades 2x30 sec horizontal and vertical Vitals: Seated: 114/67 BP, HR 67   [facility] Adult PT Treatment:                                                DATE: 06/29/2023 Therapeutic Exercise: Seated: green SB roll out flexion stretch Dynamic side bend stretches --> gradually adding cervical flexion range  Neuromuscular re-ed: Habituation: Seated forward bending + sitting up looking up (discontinued d/t back pain) Walking + slow head turns  Standing in corner with chair in front: mREO yaw/pitch head turns --> head turns both planes mREC static standing 3x30"    [facility] Adult PT Treatment:                                                DATE: 06/26/2023 Neuromuscular re-ed: Seated: Habituation with horizontal head turns 3" hold each way x 30" Habituation with vertical head turns 3" hold each way x 30" VORx1 yaw/pitch  pane 3x30" each Walking slow head turns R/center/L --> gradual increase in speed R/L Therapeutic Activity: Fall recovery: transfer supine --> standing with UE support from mat table Eccentric eccentric step down (L SLS) Side stepping GTB crossed at ankles, gaze upright   PATIENT EDUCATION: Education details: Updated HEP Person educated: Patient Education method: Explanation Education comprehension: verbalized understanding  HOME EXERCISE PROGRAM: Access Code: [email or web address] URL: [email or web address] Date: 06/29/2023 Prepared by: Carlynn Herald  Exercises - Sit to Stand with Armchair  - 2 x daily - 7 x weekly - 1 sets - 5 reps - Standing with Head Rotation  - 2 x daily - 7 x weekly - 1 sets - 10 reps - Seated Gaze Stabilization with Head Rotation  - 2 x daily - 7 x weekly - 1 sets - 10 reps - Seated Single Leg Hip Abduction with Resistance  - 1 x daily - 7 x weekly - 3 sets - 10 reps - Supine Lower Trunk Rotation  - 2 x daily - 7 x weekly - 1 sets - 10 reps - Standing Balance in Corner  - 1 x daily - 7 x weekly - 3 sets - 10 reps - Standing Balance in Corner with Eyes Closed  - 1 x daily - 7 x weekly - 3 sets - 10 reps - Corner Balance Feet Apart: Eyes Open With Head Turns  - 1 x daily - 7 x weekly - 3 sets - 10 reps - Standing with Head Nod  - 1 x daily - 7 x weekly - 3 sets - 10 reps  Access Code: [email or web address] URL: [email or web address] Date: 07/19/2023 Prepared by: Vernon Prey April Kirstie Peri  Exercises - Seated Horizontal Smooth Pursuit  - 1 x daily - 7 x weekly - 2 sets - 30 sec hold - Seated Vertical Smooth Pursuit  - 1 x daily - 7 x weekly - 2 sets - 30 sec hold - Seated Vertical Saccades  - 1 x daily - 7 x weekly - 2 sets - 30 sec hold -  Seated Horizontal Saccades  - 1 x daily - 7 x weekly - 2 sets - 30 sec hold - Seated Upper Trapezius Stretch  - 1 x daily - 7 x weekly - 2 sets - 30 sec hold - Gentle Levator Scapulae Stretch  - 1 x daily - 7 x weekly  - 2 sets - 30 sec hold  GOALS: Goals reviewed with patient? Yes  SHORT TERM GOALS: Target date: 06/06/23  Patient will be independent with initial HEP.  Baseline: To be given Goal status: MET  2.  Patient will complete Berg, and FGA if indicated. Baseline: 40/56 (Berg) Goal status: MET   3.  Patient will report decreased dizziness by 50%. Baseline: n/a Goal status: MET  LONG TERM GOALS:  Target date: 08/30/2023   Patient will be independent with advanced/ongoing HEP to improve outcomes and carryover.  Baseline: to be established. Goal status: IN PROGRESS  2.  Patient will improve Berg balance score by 5 points to demo improved functional balance.  Baseline: 40/56; Berg=50/56  Goal status: MET ON 06/01/23  3. Patient will demo floor<>stand with UE support mod indep for improved functional strength.  Baseline: has h/o falls.  Goal status: MET  4. Patient will improve gait speed to 2.8 ft/sec to demo improving mobility and dec'd risk for falls.  Baseline: 3.44 ft/sec  Goal status: MET ON 06/01/23  ASSESSMENT:  CLINICAL IMPRESSION:  Pt reports improved symptoms by end of session today. Pt was frustrated with performing exercises at home -- notes her hands have tremors so holding up the targets were difficult. Reviewed HEP and discussed modifications to stick targets to a wall instead of holding with her arm. Discussed using youtube videos for smooth pursuit exercises instead of moving her arm. Pt notes neck pain has decreased.    OBJECTIVE IMPAIRMENTS: Abnormal gait, decreased activity tolerance, decreased balance, decreased strength, dizziness, impaired sensation, and pain.   PLAN:  PT FREQUENCY: 2x/week  PT DURATION: 6 weeks  PLANNED INTERVENTIONS: Therapeutic exercises, Therapeutic activity, Neuromuscular re-education, Balance training, Gait training, Patient/Family education, Self Care, Joint mobilization, Vestibular training, Canalith repositioning, Aquatic Therapy, Dry  Needling, Electrical stimulation, Moist heat, Taping, and Manual therapy  PLAN FOR NEXT SESSION: Multi-tasking activities. Progress standing corner balance (narrowing BOS with head turns, EC with static head turns), single leg control, turning towards the right. Progress gait activities to tolerance, LE strength, balance  Nickoles Gregori April Ma L Payden Docter, PT 07/25/2023 1:17 PM

## 2023-07-26 ENCOUNTER — Telehealth: Payer: Self-pay | Admitting: Physician Assistant

## 2023-07-26 ENCOUNTER — Ambulatory Visit: Payer: Medicare Other | Attending: Gastroenterology | Admitting: Physical Therapy

## 2023-07-26 DIAGNOSIS — M545 Low back pain, unspecified: Secondary | ICD-10-CM | POA: Insufficient documentation

## 2023-07-26 DIAGNOSIS — G8929 Other chronic pain: Secondary | ICD-10-CM | POA: Insufficient documentation

## 2023-07-26 DIAGNOSIS — R278 Other lack of coordination: Secondary | ICD-10-CM | POA: Insufficient documentation

## 2023-07-26 DIAGNOSIS — M6281 Muscle weakness (generalized): Secondary | ICD-10-CM | POA: Insufficient documentation

## 2023-07-26 DIAGNOSIS — R252 Cramp and spasm: Secondary | ICD-10-CM | POA: Insufficient documentation
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
## 2023-07-26 NOTE — Telephone Encounter (Signed)
No answer at 2:25 07/26/2023
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
## 2023-07-26 NOTE — Telephone Encounter (Signed)
Patient called and states that she is not feeling like herself and is not doing well at all. She has seen Huntley Dec in June and has a follow up in Sept with her. She would like to speak to someone about what is going on with her

## 2023-07-27 ENCOUNTER — Telehealth: Payer: Self-pay | Admitting: Physician Assistant

## 2023-07-27 NOTE — Telephone Encounter (Signed)
Pt called back about sooner appointment. New appointment on 08/03/23

## 2023-07-27 NOTE — Telephone Encounter (Signed)
Lmom to get her schedule for a sooner appt with Huntley Dec

## 2023-07-31 ENCOUNTER — Ambulatory Visit: Payer: Medicare Other | Admitting: Behavioral Health

## 2023-07-31 DIAGNOSIS — S93409A Sprain of unspecified ligament of unspecified ankle, initial encounter: Secondary | ICD-10-CM

## 2023-07-31 DIAGNOSIS — S93492A Sprain of other ligament of left ankle, initial encounter: Secondary | ICD-10-CM | POA: Diagnosis not present

## 2023-07-31 DIAGNOSIS — M25571 Pain in right ankle and joints of right foot: Secondary | ICD-10-CM | POA: Insufficient documentation

## 2023-07-31 DIAGNOSIS — M25572 Pain in left ankle and joints of left foot: Secondary | ICD-10-CM | POA: Diagnosis not present

## 2023-07-31 HISTORY — DX: Sprain of unspecified ligament of unspecified ankle, initial encounter: S93.409A

## 2023-08-01 ENCOUNTER — Encounter: Payer: Self-pay | Admitting: Physical Therapy

## 2023-08-01 ENCOUNTER — Ambulatory Visit: Payer: Medicare Other | Attending: Family Medicine | Admitting: Physical Therapy

## 2023-08-01 DIAGNOSIS — R2681 Unsteadiness on feet: Secondary | ICD-10-CM

## 2023-08-01 DIAGNOSIS — R252 Cramp and spasm: Secondary | ICD-10-CM | POA: Diagnosis not present

## 2023-08-01 DIAGNOSIS — M542 Cervicalgia: Secondary | ICD-10-CM

## 2023-08-01 DIAGNOSIS — R42 Dizziness and giddiness: Secondary | ICD-10-CM

## 2023-08-01 DIAGNOSIS — M6281 Muscle weakness (generalized): Secondary | ICD-10-CM | POA: Insufficient documentation

## 2023-08-01 DIAGNOSIS — H8112 Benign paroxysmal vertigo, left ear: Secondary | ICD-10-CM | POA: Diagnosis not present

## 2023-08-01 NOTE — Therapy (Signed)
OUTPATIENT PHYSICAL THERAPY NEURO TREATMENT   Patient Name: [name] MRN: [id number] DOB:[birth year], 77 y.o., female Today's Date: 08/01/2023  PCP: Danise Edge, MD REFERRING PROVIDER: Danise Edge, MD END OF SESSION:    Date for PT Re-Evaluation 07/05/23     Authorization Type [employer] medicare     PT End of Session - 08/01/23 1315     Visit Number 13    Number of Visits 16    Authorization Type [employer] medicare    PT Start Time 1315    PT Stop Time 1400    PT Time Calculation (min) 45 min    Activity Tolerance Patient tolerated treatment well    Behavior During Therapy WFL for tasks assessed/performed            Past Medical History:  Diagnosis Date   Allergy    Anemia    Anxiety    past hx    Arthritis    Asthma    Atrial flutter (HCC)    past history- not current   Breast cancer (HCC)    CAD (coronary artery disease) CARDIOLOGIST--  DR Clifton James   mild non-obstructive cad   Cancer (HCC)    right   Cataract    bilaterally removed    Chronic constipation    Chronic kidney disease    stage 3 ckd, interstitial cystitis   Concussion    x 3   COPD (chronic obstructive pulmonary disease) (HCC)    Depression    past hx    Dyspnea    Dysrhythmia    Family history of adverse reaction to anesthesia    Family history of malignant hyperthermia    father had this   Fibromyalgia    Frequency of urination    GERD (gastroesophageal reflux disease)    H/O hiatal hernia    History of basal cell carcinoma excision    X2   History of breast cancer ONCOLOGIST-- DR Darnelle Catalan---  NO RECURRANCE   DX 07/2012;  LOW GRADE DCIS  ER+PR+  ----  S/P RIGHT LUMPECTOMY WITH NEGATIVE MARGINS/   RADIATION ENDED 11/2012   History of chronic bronchitis    History of colonic polyps    History of tachycardia    CONTROLLED  WITH ATENOLOL   Hyperlipidemia    Hypertension    Neuromuscular disorder (HCC)    fibromyalgia   Neuropathy    Osteoporosis 01/2019   T score -2.2  stable/improved from prior study   Pelvic pain    Personal history of radiation therapy    Pneumonia    S/P radiation therapy 11/12/12 - 12/05/12   right Breast   Sepsis (HCC) 2014   from UTI    Sinus headache    Sjogren's disease (HCC)    Urgency of urination    Past Surgical History:  Procedure Laterality Date   63 HOUR PH STUDY N/A 04/03/2016   Procedure: 24 HOUR PH STUDY;  Surgeon: Ruffin Frederick, MD;  Location: [facility];  Service: Gastroenterology;  Laterality: N/A;   BREAST BIOPSY Right 08/23/2012   ADH   BREAST BIOPSY Right 10/11/2012   Ductal Carcinoma   BREAST EXCISIONAL BIOPSY Right 09/04/2013   benign   BREAST LUMPECTOMY Right 10/11/2012   W/ SLN BX   CARDIAC CATHETERIZATION  09-13-2007  DR Riley Kill   WELL-PRESERVED LVF/  DIFFUSE SCATTERED CORONARY CALCIFACATION AND ATHEROSCLEROSIS WITHOUT OBSTRUCTION   CARDIAC CATHETERIZATION  08-04-2010  DR Clifton James   NON-OBSTRUCTIVE CAD/  pLAD 40%/  oLAD 30%/  mLAD 30%/  pRCA 30%/  EF 60%   CARDIOVASCULAR STRESS TEST  06-18-2012  DR McALHANY   LOW RISK NUCLEAR STUDY/  SMALL FIXED AREA OF MODERATELY DECREASED UPTAKE IN ANTEROSEPTAL WALL WHICH MAY BE ARTIFACTUAL/  NO ISCHEMIA/  EF 68%   COLONOSCOPY  09/29/2010   CYSTOSCOPY     CYSTOSCOPY WITH HYDRODISTENSION AND BIOPSY N/A 03/06/2014   Procedure: CYSTOSCOPY/HYDRODISTENSION/ INSTILATION OF MARCAINE AND PYRIDIUM;  Surgeon: Kathi Ludwig, MD;  Location: [facility] [place];  Service: Urology;  Laterality: N/A;   CYSTOSCOPY/URETEROSCOPY/HOLMIUM LASER/STENT PLACEMENT Left 03/21/2023   Procedure: CYSTOSCOPY LEFT RETROGRADE PYELOGRAM, LEFT URETEROSCOPY, HOLMIUM LASER LITHOTRIPSYM, AND LEFT URETERAL STENT PLACEMENT;  Surgeon: Rene Paci, MD;  Location: [facility];  Service: Urology;  Laterality: Left;  60 MINUTES   ESOPHAGEAL MANOMETRY N/A 04/03/2016   Procedure: ESOPHAGEAL MANOMETRY (EM);  Surgeon: Ruffin Frederick, MD;  Location: [facility];   Service: Gastroenterology;  Laterality: N/A;   EXTRACORPOREAL SHOCK WAVE LITHOTRIPSY Left 02/06/2019   Procedure: EXTRACORPOREAL SHOCK WAVE LITHOTRIPSY (ESWL);  Surgeon: Ihor Gully, MD;  Location: [facility];  Service: Urology;  Laterality: Left;   NASAL SINUS SURGERY  1985   ORIF RIGHT ANKLE  FX  2006   POLYPECTOMY     REMOVAL VOCAL CORD CYST  01/2013   RIGHT BREAST BX  08/23/2012   RIGHT HAND SURGERY  X3  LAST ONE 2009   INCLUDES  ORIF RIGHT 5TH FINGER AND REVISION TWICE   SKIN CANCER EXCISION     face,arms   TONSILLECTOMY AND ADENOIDECTOMY  AGE 43   TOTAL ABDOMINAL HYSTERECTOMY W/ BILATERAL SALPINGOOPHORECTOMY  1982   W/  APPENDECTOMY   TRANSTHORACIC ECHOCARDIOGRAM  06/24/2012   GRADE I DIASTOLIC DYSFUNCTION/  EF 55-60%/  MILD MR   UPPER GASTROINTESTINAL ENDOSCOPY     Patient Active Problem List   Diagnosis Date Noted   Multiple drug allergies 05/09/2023   Recurrent infections 05/09/2023   Rash and other nonspecific skin eruption 05/09/2023   Local reaction to hymenoptera sting 05/09/2023   Allergic reaction 05/09/2023   Other adverse food reactions, not elsewhere classified, subsequent encounter 05/09/2023   Weight loss 05/01/2023   Common bile duct dilatation 02/13/2023   Oral herpes 02/05/2023   Tongue lesion 02/05/2023   Drug reaction 02/05/2023   Cellulitis of breast 02/05/2023   Elevated alkaline phosphatase level 01/25/2023   Facial trauma 01/22/2023   Vertigo 01/22/2023   Intertrigo 12/13/2022   Hyperkalemia 09/28/2022   Anemia 09/28/2022   Tremor 07/05/2022   RUQ pain 07/05/2022   Leukocytosis 07/05/2022   Chronic renal insufficiency 03/21/2022   Sjogren's disease (HCC) 12/12/2021   Vocal cord cyst 05/18/2021   High serum vitamin D 05/18/2021   Dysuria 03/09/2021   Vitamin B12 deficiency 03/08/2021   Preventative health care 03/08/2021   Hyperglycemia 03/08/2021   Low back pain 09/08/2020   OSA and COPD overlap syndrome (HCC) 06/10/2020   Recurrent falls  05/02/2020   Statin intolerance 02/29/2020   RLS (restless legs syndrome) 11/09/2019   Kidney stone 02/26/2019   Recurrent sinusitis 02/25/2019   Hx of colonic polyp 02/25/2019   DDD (degenerative disc disease), cervical 09/17/2018   Degenerative scoliosis 04/22/2018   Primary insomnia 06/25/2017   Chronic interstitial cystitis 02/01/2017   Chronic cough 01/09/2017   Right arm pain 07/20/2016   Deviated septum 05/12/2016   Nasal turbinate hypertrophy 05/12/2016   Dysphagia    Allergic rhinitis 01/25/2016   Other and combined forms of senile cataract 07/15/2015  Dyspnea    Left hip pain 04/30/2015   Sciatica 04/30/2015   Knee pain, right 04/30/2015   Bilateral carpal tunnel syndrome 03/04/2015   Hyperlipidemia 03/01/2015   Pulmonary nodules 02/12/2015   External hemorrhoids 02/05/2015   Chronic night sweats 01/08/2015   Atypical chest pain 01/01/2015   Memory impairment 05/22/2014   Peripheral neuropathy 05/22/2014   Abdominal pain 10/26/2013   Headache 10/13/2013   Arthralgia 08/18/2013   ANA positive 07/29/2013   Depression 02/05/2013   Family history of malignant hyperthermia 02/05/2013   Malignant neoplasm of lower-outer quadrant of right breast of female, estrogen receptor positive (HCC) 01/03/2013   Splenic lesion 09/02/2012   Asthma, allergic 08/05/2012   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   GERD (gastroesophageal reflux disease) 06/03/2012   Edema 04/08/2012   Stricture and stenosis of esophagus 10/19/2011   Constipation 07/21/2011   Nonspecific (abnormal) findings on radiological and other examination of biliary tract 07/21/2011   Osteoporosis 04/17/2011   Leg pain 02/24/2011   FIBROCYSTIC BREAST DISEASE, HX OF 10/18/2010   Essential hypertension 08/25/2010   CAD in native artery 08/25/2010   TOBACCO ABUSE, HX OF 08/25/2010   GENERALIZED ANXIETY DISORDER 08/03/2010   Breast pain 01/11/2010   Fibromyalgia 01/11/2010    ONSET DATE: 04/24/23 REFERRING DIAG:  R42  (ICD-10-CM) - Dizzy spells  R53.1 (ICD-10-CM) - Weakness   THERAPY DIAG:  Unsteadiness on feet  Dizziness and giddiness  BPPV (benign paroxysmal positional vertigo), left  Cervicalgia  Cramp and spasm  Rationale for Evaluation and Treatment: Rehabilitation  SUBJECTIVE:                                                                                                                                                                                           SUBJECTIVE STATEMENT: Pt states she went to the foot doctor. Reports she has a tear in her L foot ligament and maybe a small bone fracture. Just got an ankle brace yesterday from him. Vestibular exercises worked better. Back is hurting her a lot today and she is generally hurting all over. Pt states she did well on Sunday with her metronome. 3 or 4/10 dizziness.  Pt accompanied by: self  PERTINENT HISTORY: chronic back pain, (scheduled for back ablation in June 2024), h/o vertigo PAIN:  Are you having pain? Yes: NPRS scale: notes mild back pain Pain location: low back  Pain description: chronic Aggravating factors: PT will monitor Relieving factors: *has upcoming intervention scheduled PRECAUTIONS: Fall WEIGHT BEARING RESTRICTIONS: No FALLS: Has patient fallen in last 6 months? Yes. Number of falls 3 LIVING ENVIRONMENT: Lives with: lives with their spouse Lives in: House/apartment  Stairs: One step with one rail PLOF: Independent with basic ADLs-- notes declining mobility.  PATIENT GOALS: "stop the spinning and be able to walk and not falter."  OBJECTIVE:  (Measures in this section from initial evaluation unless otherwise noted) COGNITION: Overall cognitive status:  Patient mentions memory changes since first fall where she hit her head -- she is unable to recall date SENSATION:  Patient reports numbness and cold sensations in feet. LOWER EXTREMITY MMT:   MMT Right Eval Left Eval  Hip flexion 3+/5 4/5  Knee flexion 4/5  4/5  Knee extension 5/5 5/5  Ankle dorsiflexion 3+/5 3+/5  Ankle plantarflexion    Ankle inversion    Ankle eversion    (Blank rows = not tested) BED MOBILITY:  Sit to supine Complete Independence Supine to sit Complete Independence Rolling to Right Complete Independence Rolling to Left Complete Independence *notes "I'm in a fog" with returning to sitting GAIT: Gait pattern:  slowed pace with SPC and wide BOS Distance walked: 100 ft Assistive device utilized: Single point cane FUNCTIONAL TESTS:  Berg= 40/56  UPDATE: 50/56 ON 06/01/23 5 time sit to stand=20.46 seconds with Ues on legs 35M=0.64 m/sec or 2.10 ft/sec  UPDATE: 3.44 ft/sec ON 06/01/23 PATIENT SURVEYS:  FOTO n/a--not measured  due to vertigo/balance  VESTIBULAR ASSESSMENT: GENERAL OBSERVATION: Gets intermittent dizziness SYMPTOM BEHAVIOR:  Subjective history: Has intermittent h/o vertigo  Non-Vestibular symptoms:  none  Type of dizziness: Spinning/Vertigo, Unsteady with head/body turns, and Lightheadedness/Faint  Frequency: daily   Duration: seconds to minutes  Aggravating factors: Worse in the morning and with getting up after sitting for longer periods  Relieving factors: unsure  Progression of symptoms: unchanged OCULOMOTOR EXAM:  Ocular Alignment: normal  Ocular ROM: No Limitations  Spontaneous Nystagmus: absent  Gaze-Induced Nystagmus: absent  Smooth Pursuits: intact but notes target seems "out of reach"   Saccades: intact VESTIBULAR - OCULAR REFLEX:   Slow VOR: Comment: 2 reps provokes a sensation of bad spinning  Head-Impulse Test:  Did not test today due to inability to tolerate slow VOR  Dynamic Visual Acuity: to be assessed POSITIONAL TESTING: Right Dix-Hallpike: no nystagmus Left Dix-Hallpike: no nystagmus Right Roll Test: TBA Left Roll Test: TBA Right Sidelying: no nystagmus Left Sidelying: no nystagmus *Notes symptoms with return to sitting.   [facility] Adult PT Treatment:                                                 DATE: 08/01/23 Neuromuscular re-ed: Feet together smooth pursuit 2x30 sec [email or web address] Feet together saccades 2x30 sec [email or web address] Feet together VOR x 1 horizontal and then vertical metronome 120 BPM, 130 BPM 2x30 sec each [email or web address] Self Care: Donning/doffing ankle brace Discussed PT POC due to pt with multiple diagnoses and referrals for therapy    [facility] Adult PT Treatment:                                                DATE: 07/25/23 Neuromuscular re-ed: Smooth pursuit seated 3x30 sec [email or web address] 2x30 sec [email or web address] 2x30 sec [email or web address] Saccades seated 2x30 sec horizontal and vertical (post it note on wall) Seated VOR x 1 at 100 BPM, 110 BPM, 120 BPM x30 sec each horizontal and  then vertical Self Care: Discussed progressions for home   [facility] Adult PT Treatment:                                                DATE: 07/19/23 Therapeutic Exercise: UT stretch x 30 sec B Levator scap stretch x 30 sec B Manual Therapy: STM  & TPR B suboccipitals, cervical paraspinals, upper traps Neuromuscular re-ed: Dix-hallpike (-) bilat Habituation Supine head turns x10  Supine head nods x10 Gaze stabilization Seated VOR x 1 horizontal and vertical x30 sec each at 100 BPM and then 110 BPM Smooth pursuit 2x30 sec horizontal and vertical 100 - 110 BPM Saccades 2x30 sec horizontal and vertical Vitals: Seated: 114/67 BP, HR 67   [facility] Adult PT Treatment:                                                DATE: 06/29/2023 Therapeutic Exercise: Seated: green SB roll  out flexion stretch Dynamic side bend stretches --> gradually adding cervical flexion range  Neuromuscular re-ed: Habituation: Seated forward bending + sitting up looking up (discontinued d/t back pain) Walking + slow head turns  Standing in corner with chair in front: mREO yaw/pitch head turns --> head turns both planes mREC static standing 3x30"    [facility] Adult PT Treatment:                                                DATE: 06/26/2023 Neuromuscular re-ed: Seated: Habituation with horizontal head turns 3" hold each way x 30" Habituation with vertical head turns 3" hold each way x 30" VORx1 yaw/pitch pane 3x30" each Walking slow head turns R/center/L --> gradual increase in speed R/L Therapeutic Activity: Fall recovery: transfer supine --> standing with UE support from mat table Eccentric eccentric step down (L SLS) Side stepping GTB crossed at ankles, gaze upright   PATIENT EDUCATION: Education details: Updated HEP Person educated: Patient Education method: Explanation Education comprehension: verbalized understanding  HOME EXERCISE PROGRAM: Access Code: [email or web address] URL: [email or web address] Date: 06/29/2023 Prepared by: Carlynn Herald  Exercises - Sit to Stand with Armchair  - 2 x daily - 7 x weekly - 1 sets - 5 reps - Standing with Head Rotation  - 2 x daily - 7 x weekly - 1 sets - 10 reps - Seated Gaze Stabilization with Head Rotation  - 2 x daily - 7 x weekly - 1 sets - 10 reps - Seated Single Leg Hip Abduction with Resistance  - 1 x daily - 7 x weekly - 3 sets - 10 reps - Supine Lower Trunk Rotation  - 2 x daily - 7 x weekly - 1 sets - 10 reps - Standing Balance in Corner  - 1 x daily - 7 x weekly - 3 sets - 10 reps - Standing Balance in Corner with Eyes Closed  - 1 x daily - 7 x weekly - 3 sets - 10 reps - Corner Balance Feet Apart: Eyes Open With Head Turns  - 1 x daily - 7 x weekly - 3 sets - 10 reps - Standing  with Head Nod  - 1 x daily - 7 x weekly  - 3 sets - 10 reps  Access Code: [email or web address] URL: [email or web address] Date: 07/19/2023 Prepared by: Vernon Prey April Kirstie Peri  Exercises - Seated Horizontal Smooth Pursuit  - 1 x daily - 7 x weekly - 2 sets - 30 sec hold - Seated Vertical Smooth Pursuit  - 1 x daily - 7 x weekly - 2 sets - 30 sec hold - Seated Vertical Saccades  - 1 x daily - 7 x weekly - 2 sets - 30 sec hold - Seated Horizontal Saccades  - 1 x daily - 7 x weekly - 2 sets - 30 sec hold - Seated Upper Trapezius Stretch  - 1 x daily - 7 x weekly - 2 sets - 30 sec hold - Gentle Levator Scapulae Stretch  - 1 x daily - 7 x weekly - 2 sets - 30 sec hold  GOALS: Goals reviewed with patient? Yes  SHORT TERM GOALS: Target date: 06/06/23  Patient will be independent with initial HEP.  Baseline: To be given Goal status: MET  2.  Patient will complete Berg, and FGA if indicated. Baseline: 40/56 (Berg) Goal status: MET   3.  Patient will report decreased dizziness by 50%. Baseline: n/a Goal status: MET  LONG TERM GOALS:  Target date: 08/30/2023   Patient will be independent with advanced/ongoing HEP to improve outcomes and carryover.  Baseline: to be established. Goal status: IN PROGRESS  2.  Patient will improve Berg balance score by 5 points to demo improved functional balance.  Baseline: 40/56; Berg=50/56  Goal status: MET ON 06/01/23  3. Patient will demo floor<>stand with UE support mod indep for improved functional strength.  Baseline: has h/o falls.  Goal status: MET  4. Patient will improve gait speed to 2.8 ft/sec to demo improving mobility and dec'd risk for falls.  Baseline: 3.44 ft/sec  Goal status: MET ON 06/01/23  ASSESSMENT:  CLINICAL IMPRESSION: Able to progress pt's vestibular exercises into standing today. No LOB when feet were placed together. Time spent to stabilize ankle in her ankle brace to tolerate standing. Discussed POC since pt has multiple therapy diagnoses to work  on.   OBJECTIVE IMPAIRMENTS: Abnormal gait, decreased activity tolerance, decreased balance, decreased strength, dizziness, impaired sensation, and pain.   PLAN:  PT FREQUENCY: 2x/week  PT DURATION: 6 weeks  PLANNED INTERVENTIONS: Therapeutic exercises, Therapeutic activity, Neuromuscular re-education, Balance training, Gait training, Patient/Family education, Self Care, Joint mobilization, Vestibular training, Canalith repositioning, Aquatic Therapy, Dry Needling, Electrical stimulation, Moist heat, Taping, and Manual therapy  PLAN FOR NEXT SESSION: Multi-tasking activities. Progress standing corner balance (narrowing BOS with head turns, EC with static head turns), single leg control, turning towards the right. Progress gait activities to tolerance, LE strength, balance   April Randalyn Rhea Winkelman, PT 08/01/2023 4:06 PM

## 2023-08-03 ENCOUNTER — Encounter: Payer: Self-pay | Admitting: Physician Assistant

## 2023-08-03 ENCOUNTER — Ambulatory Visit: Payer: Medicare Other | Admitting: Physician Assistant

## 2023-08-03 VITALS — BP 118/67 | HR 84 | Resp 20 | Ht 65.0 in | Wt 123.0 lb

## 2023-08-03 DIAGNOSIS — F3289 Other specified depressive episodes: Secondary | ICD-10-CM | POA: Diagnosis not present

## 2023-08-03 DIAGNOSIS — R413 Other amnesia: Secondary | ICD-10-CM

## 2023-08-03 NOTE — Progress Notes (Addendum)
Assessment/Plan:   Memory difficulties, likely multifactorial  [name] is a very pleasant 77 y.o. RH female with extensive medical history including hypertension, hyperlipidemia, multiple drug allergies, polypharmacy, osteoporosis, depression, right breast cancer status post radiation therapy 2013, chronic pain and fibromyalgia, Sjogren's syndrome, COPD, CAD, history of chronic BPPV,  presenting today in follow-up prior to her scheduled visit with subjective complaints of memory loss.  In the past, she had been seen with an initial MoCA of 25/30.  Her MRI of the brain from June 2024, personally reviewed was without acute intracranial abnormalities, mild chronic small vessel ischemic disease not significantly changed since 2018, and mild cerebral atrophy.  It was discussed with her the role of polypharmacy which were to contribute to her memory difficulties among other issues.  Psychiatry psychotherapy was recommended at the time, however she has not yet been seen.  She is not on antidementia medication at this time.  MMSE today is 29/30.  Had a long discussion with the patient regarding the role of her other medical issues and polypharmacy, as well as the role of her emotional distress playing on her memory.  Patient has been tearful throughout the whole visit, but able to answer all the questions appropriately and follow the commands.  Neuropsychological evaluation near future for clarity of the diagnosis and to determine other causes of loss.   Recommendations:   Follow up in   months. Patient is scheduled for neuropsych evaluation to further determine other underlying causes of memory changes including potential contribution from sleep, anxiety, or depression. Referral to [facility] had been placed during her last visit in June 2024, for possible PTSD, depression, anxiety Recommend discussion with clinical pharmacy about polypharmacy, which can contribute to memory concerns.  Currently, she is on  Cymbalta, Neurontin, hydroxyzine, Benadryl as needed, Ultram, trazodone. Recommend good control of cardiovascular risk factors Continue to control mood as per PCP Continue following up at [facility] for paresthesias Continue vestibular therapy for BPPV Recommend following up on her sleep, consider repeating sleep studies visit with PCP and wants to address this with her. Monitor driving    Subjective:   This patient is here alone.  Previous records as well as any outside records available were reviewed prior to todays visit.   Patient was last seen on 05/30/2023, with a MoCA of 25/30.    Any changes in memory since last visit? " Memory is worse, I have a hard time focusing when he is worried that I have dementia".  She reports chronic difficulty pulling out words, difficulty spelling.  She reports that her pronunciation is "awful".  As before, she states that her mind says something before she can think about it.  As before, she might forget in the sentence finish the sentence for her. repeats oneself?  Endorsed Disoriented when walking into a room?  Patient denies except occasionally not remembering what patient came to the room for.  She may become lost in the stores Leaving objects in unusual places?  Endorsed, sometimes.   Wandering behavior?   denies   Any personality changes since last visit?   denies   Any worsening depression?:  She has a history of depression after her first husband had been killed.  She reports emotional trauma and never has sought psychotherapy for that Hallucinations or paranoia?  One-time I saw a spider but it was not there. Seizures?   denies    Any sleep changes? Does not sleep very well .  She reports vivid  dreams, "usually bad dreams ".  Denies vivid dreams, REM behavior or sleepwalking   Sleep apnea?  She had a sleep study in the past, "showing that the brain never stops "   Any hygiene concerns?   denies   Independent of bathing and dressing?  Endorsed  Does  the patient needs help with medications? Patient is in charge   Who is in charge of the finances?  Patient is in charge     Any changes in appetite?  denies     Patient have trouble swallowing?  denies   Does the patient cook?  Any kitchen accidents such as leaving the stove on?   denies   Any headaches?  Intermittent. Vision changes? denies Chronic pain?  Endorsed  Ambulates with difficulty?  She has a history of BPPV, which at times causes problems with gait and balance, and if she tilts the head to the right may worsen the symptoms.  She is doing PT and VT  Recent falls or head injuries?    denies      Unilateral weakness, numbness or tingling?  As before mentioned, she complains of balance issues, vertigo, falls, tremors with prior negative studies at [facility].  She has chronic paresthesias as well, undergoing extensive neuropathy studies at [facility] yielding normal results.  She is now followed at [facility] for neuropathy. Any tremors?  "When anxious " Any anosmia?    denies   Any incontinence of urine?  denies   Any bowel dysfunction?  She has a history of chronic constipation.     Patient lives with her husband "he is very overprotective, he is wonderful, but babies to be too much " Does the patient drive?  Sometimes she gets lost when driving, forgetting how to get to places.  It happened this morning when I was coming here, he almost got lost.  Past Medical History:  Diagnosis Date   Allergy    Anemia    Anxiety    past hx    Arthritis    Asthma    Atrial flutter (HCC)    past history- not current   Breast cancer (HCC)    CAD (coronary artery disease) CARDIOLOGIST--  DR Clifton James   mild non-obstructive cad   Cancer (HCC)    right   Cataract    bilaterally removed    Chronic constipation    Chronic kidney disease    stage 3 ckd, interstitial cystitis   Concussion    x 3   COPD (chronic obstructive pulmonary disease) (HCC)    Depression    past hx    Dyspnea    Dysrhythmia     Family history of adverse reaction to anesthesia    Family history of malignant hyperthermia    father had this   Fibromyalgia    Frequency of urination    GERD (gastroesophageal reflux disease)    H/O hiatal hernia    History of basal cell carcinoma excision    X2   History of breast cancer ONCOLOGIST-- DR Darnelle Catalan---  NO RECURRANCE   DX 07/2012;  LOW GRADE DCIS  ER+PR+  ----  S/P RIGHT LUMPECTOMY WITH NEGATIVE MARGINS/   RADIATION ENDED 11/2012   History of chronic bronchitis    History of colonic polyps    History of tachycardia    CONTROLLED  WITH ATENOLOL   Hyperlipidemia    Hypertension    Neuromuscular disorder (HCC)    fibromyalgia   Neuropathy    Osteoporosis 01/2019  T score -2.2 stable/improved from prior study   Pelvic pain    Personal history of radiation therapy    Pneumonia    S/P radiation therapy 11/12/12 - 12/05/12   right Breast   Sepsis (HCC) 2014   from UTI    Sinus headache    Sjogren's disease (HCC)    Urgency of urination      Past Surgical History:  Procedure Laterality Date   1 HOUR PH STUDY N/A 04/03/2016   Procedure: 24 HOUR PH STUDY;  Surgeon: Ruffin Frederick, MD;  Location: [facility];  Service: Gastroenterology;  Laterality: N/A;   BREAST BIOPSY Right 08/23/2012   ADH   BREAST BIOPSY Right 10/11/2012   Ductal Carcinoma   BREAST EXCISIONAL BIOPSY Right 09/04/2013   benign   BREAST LUMPECTOMY Right 10/11/2012   W/ [facility] BX   CARDIAC CATHETERIZATION  09-13-2007  DR Riley Kill   WELL-PRESERVED LVF/  DIFFUSE SCATTERED CORONARY CALCIFACATION AND ATHEROSCLEROSIS WITHOUT OBSTRUCTION   CARDIAC CATHETERIZATION  08-04-2010  DR MCALHANY   NON-OBSTRUCTIVE CAD/  pLAD 40%/  oLAD 30%/  mLAD 30%/  pRCA 30%/  EF 60%   CARDIOVASCULAR STRESS TEST  06-18-2012  DR McALHANY   LOW RISK NUCLEAR STUDY/  SMALL FIXED AREA OF MODERATELY DECREASED UPTAKE IN ANTEROSEPTAL WALL WHICH MAY BE ARTIFACTUAL/  NO ISCHEMIA/  EF 68%   COLONOSCOPY  09/29/2010    CYSTOSCOPY     CYSTOSCOPY WITH HYDRODISTENSION AND BIOPSY N/A 03/06/2014   Procedure: CYSTOSCOPY/HYDRODISTENSION/ INSTILATION OF MARCAINE AND PYRIDIUM;  Surgeon: Kathi Ludwig, MD;  Location: [facility];  Service: Urology;  Laterality: N/A;   CYSTOSCOPY/URETEROSCOPY/HOLMIUM LASER/STENT PLACEMENT Left 03/21/2023   Procedure: CYSTOSCOPY LEFT RETROGRADE PYELOGRAM, LEFT URETEROSCOPY, HOLMIUM LASER LITHOTRIPSYM, AND LEFT URETERAL STENT PLACEMENT;  Surgeon: Rene Paci, MD;  Location: [facility];  Service: Urology;  Laterality: Left;  60 MINUTES   ESOPHAGEAL MANOMETRY N/A 04/03/2016   Procedure: ESOPHAGEAL MANOMETRY (EM);  Surgeon: Ruffin Frederick, MD;  Location: [facility];  Service: Gastroenterology;  Laterality: N/A;   EXTRACORPOREAL SHOCK WAVE LITHOTRIPSY Left 02/06/2019   Procedure: EXTRACORPOREAL SHOCK WAVE LITHOTRIPSY (ESWL);  Surgeon: Ihor Gully, MD;  Location: [facility];  Service: Urology;  Laterality: Left;   NASAL SINUS SURGERY  1985   ORIF RIGHT ANKLE  FX  2006   POLYPECTOMY     REMOVAL VOCAL CORD CYST  01/2013   RIGHT BREAST BX  08/23/2012   RIGHT HAND SURGERY  X3  LAST ONE 2009   INCLUDES  ORIF RIGHT 5TH FINGER AND REVISION TWICE   SKIN CANCER EXCISION     face,arms   TONSILLECTOMY AND ADENOIDECTOMY  AGE 37   TOTAL ABDOMINAL HYSTERECTOMY W/ BILATERAL SALPINGOOPHORECTOMY  1982   W/  APPENDECTOMY   TRANSTHORACIC ECHOCARDIOGRAM  06/24/2012   GRADE I DIASTOLIC DYSFUNCTION/  EF 55-60%/  MILD MR   UPPER GASTROINTESTINAL ENDOSCOPY       PREVIOUS MEDICATIONS:   CURRENT MEDICATIONS:  Outpatient Encounter Medications as of 08/03/2023  Medication Sig   acetaminophen (TYLENOL) 500 MG tablet Take 1,000 mg by mouth every 6 (six) hours as needed for mild pain or headache.    albuterol (VENTOLIN HFA) 108 (90 Base) MCG/ACT inhaler Inhale 2 puffs into the lungs every 4 (four) hours as needed for wheezing or shortness of breath (coughing fits).   amLODipine  (NORVASC) 5 MG tablet Take 1 tablet (5 mg total) by mouth daily.   aspirin EC 81 MG tablet Take 1 tablet (81 mg  total) by mouth daily.   atenolol (TENORMIN) 25 MG tablet Take 1 tablet (25 mg total) by mouth 2 (two) times daily. Take 1 tablet by mouth 2 time a day   CALCIUM PO Take 500 mg by mouth daily.   carboxymethylcellul-glycerin (OPTIVE) 0.5-0.9 % ophthalmic solution Place 1 drop into both eyes daily as needed for dry eyes.   Cholecalciferol (VITAMIN D3) 50 MCG (2000 UT) CAPS Take 2,000 Units by mouth daily.   cyanocobalamin (VITAMIN B12) 1000 MCG tablet Take 1,000 mg by mouth daily with breakfast.   diclofenac Sodium (VOLTAREN) 1 % GEL Apply 2 g topically daily as needed (Back pain).   diphenhydrAMINE (BENADRYL) 25 mg capsule Take 25 mg by mouth every 6 (six) hours as needed for itching.   Docusate Sodium (DSS) 100 MG CAPS Take 100 mg by mouth daily as needed (Constipation).   DULoxetine (CYMBALTA) 60 MG capsule Take 1 capsule by mouth twice daily   EPINEPHrine 0.3 mg/0.3 mL IJ SOAJ injection Inject 0.3 mg into the muscle as needed for anaphylaxis.   famotidine (PEPCID) 20 MG tablet Take 1 tablet (20 mg total) by mouth 2 (two) times daily as needed for heartburn or indigestion.   fluticasone (FLONASE) 50 MCG/ACT nasal spray Use 2 spray(s) in each nostril once daily (Patient taking differently: Place 2 sprays into both nostrils daily as needed for allergies or rhinitis.)   fluticasone (FLOVENT HFA) 110 MCG/ACT inhaler Inhale 2 puffs into the lungs 2 (two) times daily. (Patient taking differently: Inhale 2 puffs into the lungs daily as needed (Short of breath).)   fluticasone-salmeterol (ADVAIR) 250-50 MCG/ACT AEPB Inhale 1 puff into the lungs in the morning and at bedtime.   folic acid (FOLVITE) 1 MG tablet Take 1 tablet (1 mg total) by mouth daily.   furosemide (LASIX) 20 MG tablet Take 1 tablet (20 mg total) by mouth as needed for edema.   gabapentin (NEURONTIN) 300 MG capsule Take 300-900  mg by mouth See admin instructions. Take 600 mg in the morning 300 mg at lunch time and 900 mg at bedtime   hydrOXYzine (ATARAX) 10 MG tablet Take 1 tablet (10 mg total) by mouth 3 (three) times daily as needed.   linaclotide (LINZESS) 145 MCG CAPS capsule Take 1 capsule (145 mcg total) by mouth daily as needed. Lot: [id number]  Exp:11-2023   loratadine (CLARITIN) 10 MG tablet Take 10 mg by mouth daily as needed for rhinitis.   Multiple Vitamins-Minerals (MULTIVITAMIN WITH MINERALS) tablet Take 1 tablet by mouth daily. (Patient taking differently: Take 1 tablet by mouth daily. 50+)   nitroGLYCERIN (NITROSTAT) 0.4 MG SL tablet Place 1 tablet (0.4 mg total) under the tongue every 5 (five) minutes as needed for chest pain.   nystatin cream (MYCOSTATIN) Apply 1 application topically 2 (two) times daily. (Patient taking differently: Apply 1 application  topically daily as needed (vaginal yeast).)   pantoprazole (PROTONIX) 40 MG tablet Take 1 tablet (40 mg total) by mouth 2 (two) times daily.   pentosan polysulfate (ELMIRON) 100 MG capsule Take 1 capsule (100 mg total) by mouth 3 (three) times daily. Reported on 01/05/2016 (Patient taking differently: Take 100 mg by mouth daily as needed (burning and pain). Reported on 01/05/2016)   polyethylene glycol (MIRALAX) 17 g packet Take 17 g by mouth 2 (two) times daily.   REPATHA SURECLICK 140 MG/ML SOAJ INJECT 140 MG INTO THE SKIN EVERY 14 DAYS   sucralfate (CARAFATE) 1 GM/10ML suspension Take 10 mLs (1 g total)  by mouth 4 (four) times daily -  with meals and at bedtime. (Patient taking differently: Take 1 g by mouth 2 (two) times daily.)   traMADol (ULTRAM) 50 MG tablet Take 25 mg by mouth 2 (two) times daily.   traZODone (DESYREL) 100 MG tablet TAKE 1 TABLET BY MOUTH AT BEDTIME   Vitamin D, Ergocalciferol, (DRISDOL) 1.25 MG (50000 UNIT) CAPS capsule Take 1 capsule (50,000 Units total) by mouth every 7 (seven) days. 12x Weeks   No facility-administered encounter  medications on file as of 08/03/2023.     Objective:     PHYSICAL EXAMINATION:    VITALS:   Vitals:   08/03/23 0929  BP: 118/67  Pulse: 84  Resp: 20  SpO2: 96%  Weight: 123 lb (55.8 kg)  Height: 5\' 5"  (1.651 m)    GEN:  The patient appears stated age and is in NAD. HEENT:  Normocephalic, atraumatic.   Neurological examination:  General: NAD, well-groomed, appears stated age. Orientation: The patient is alert. Oriented to person, place and date.  The patient has been tearful throughout the whole visit. Cranial nerves: There is good facial symmetry.very anxious appearing.  The speech is fluent and clear. No aphasia or dysarthria. Fund of knowledge is appropriate. Recent memory and remote memory is normal.  Attention and concentration are normal.  Able to name objects and repeat phrases.  Hearing is intact to conversational tone .   Delayed recall 3/3 Sensation: Sensation is intact to light touch throughout Motor: Strength is at least antigravity x4. DTR's 2/4 in UE/LE      05/30/2023    5:00 PM  Montreal Cognitive Assessment   Visuospatial/ Executive (0/5) 3  Naming (0/3) 2  Attention: Read list of digits (0/2) 2  Attention: Read list of letters (0/1) 1  Attention: Serial 7 subtraction starting at 100 (0/3) 3  Language: Repeat phrase (0/2) 2  Language : Fluency (0/1) 1  Abstraction (0/2) 1  Delayed Recall (0/5) 4  Orientation (0/6) 6  Total 25  Adjusted Score (based on education) 25       08/03/2023   12:00 PM 07/26/2016   10:10 AM  MMSE - Mini Mental State Exam  Orientation to time 5 4  Orientation to Place 5 4  Registration 3 3  Attention/ Calculation 5 5  Recall 3 2  Language- name 2 objects 2 2  Language- repeat 1 1  Language- follow 3 step command 3 2  Language- read & follow direction 1 1  Write a sentence 1 1  Copy design 0 0  Total score 29 25       Movement examination: Tone: There is normal tone in the UE/LE Abnormal movements:  no tremor.  No  myoclonus.  No asterixis.   Coordination:  There is no decremation with RAM's. Normal finger to nose  Gait and Station: The patient has no difficulty arising out of a deep-seated chair without the use of the hands. The patient's stride length is good.  Gait is cautious and narrow.   Thank you for allowing [place] the opportunity to participate in the care of this nice patient. Please do not hesitate to contact us for any questions or concerns.   Total time spent on today's visit was 36 minutes dedicated to this patient today, preparing to see patient, examining the patient, ordering tests and/or medications and counseling the patient, documenting clinical information in the EHR or other health record, independently interpreting results and communicating results to the patient/family,  discussing treatment and goals, answering patient's questions and coordinating care.  Cc:  Bradd Canary, MD  Marlowe Kays 08/03/2023 12:48 PM

## 2023-08-03 NOTE — Patient Instructions (Addendum)

## 2023-08-07 DIAGNOSIS — M5136 Other intervertebral disc degeneration, lumbar region: Secondary | ICD-10-CM | POA: Diagnosis not present

## 2023-08-07 DIAGNOSIS — M5030 Other cervical disc degeneration, unspecified cervical region: Secondary | ICD-10-CM | POA: Diagnosis not present

## 2023-08-07 DIAGNOSIS — M5416 Radiculopathy, lumbar region: Secondary | ICD-10-CM | POA: Diagnosis not present

## 2023-08-07 DIAGNOSIS — Z79891 Long term (current) use of opiate analgesic: Secondary | ICD-10-CM | POA: Diagnosis not present

## 2023-08-08 ENCOUNTER — Other Ambulatory Visit: Payer: Self-pay

## 2023-08-08 ENCOUNTER — Other Ambulatory Visit (INDEPENDENT_AMBULATORY_CARE_PROVIDER_SITE_OTHER): Payer: Medicare Other

## 2023-08-08 DIAGNOSIS — D509 Iron deficiency anemia, unspecified: Secondary | ICD-10-CM

## 2023-08-08 DIAGNOSIS — R300 Dysuria: Secondary | ICD-10-CM | POA: Diagnosis not present

## 2023-08-08 LAB — URINALYSIS
Bilirubin Urine: NEGATIVE
Hgb urine dipstick: NEGATIVE
Ketones, ur: NEGATIVE
Leukocytes,Ua: NEGATIVE
Nitrite: NEGATIVE
Specific Gravity, Urine: 1.02 (ref 1.000–1.030)
Total Protein, Urine: NEGATIVE
Urine Glucose: NEGATIVE
Urobilinogen, UA: 1 (ref 0.0–1.0)
pH: 6 (ref 5.0–8.0)

## 2023-08-08 LAB — CBC WITH DIFFERENTIAL/PLATELET
Basophils Absolute: 0.1 10*3/uL (ref 0.0–0.1)
Basophils Relative: 0.8 % (ref 0.0–3.0)
Eosinophils Absolute: 0.2 10*3/uL (ref 0.0–0.7)
Eosinophils Relative: 2.9 % (ref 0.0–5.0)
HCT: 35.9 % — ABNORMAL LOW (ref 36.0–46.0)
Hemoglobin: 11.3 g/dL — ABNORMAL LOW (ref 12.0–15.0)
Lymphocytes Relative: 14.9 % (ref 12.0–46.0)
Lymphs Abs: 1.2 10*3/uL (ref 0.7–4.0)
MCHC: 31.6 g/dL (ref 30.0–36.0)
MCV: 88.8 fl (ref 78.0–100.0)
Monocytes Absolute: 0.7 10*3/uL (ref 0.1–1.0)
Monocytes Relative: 8.4 % (ref 3.0–12.0)
Neutro Abs: 5.9 10*3/uL (ref 1.4–7.7)
Neutrophils Relative %: 73 % (ref 43.0–77.0)
Platelets: 300 10*3/uL (ref 150.0–400.0)
RBC: 4.04 Mil/uL (ref 3.87–5.11)
RDW: 18.5 % — ABNORMAL HIGH (ref 11.5–15.5)
WBC: 8.1 10*3/uL (ref 4.0–10.5)

## 2023-08-08 LAB — MICROALBUMIN / CREATININE URINE RATIO
Creatinine,U: 107.8 mg/dL
Microalb Creat Ratio: 0.6 mg/g (ref 0.0–30.0)
Microalb, Ur: <0.7 mg/dL (ref 0.0–1.9)

## 2023-08-08 LAB — FECAL OCCULT BLOOD, IMMUNOCHEMICAL: Fecal Occult Bld: NEGATIVE

## 2023-08-09 ENCOUNTER — Encounter (INDEPENDENT_AMBULATORY_CARE_PROVIDER_SITE_OTHER): Payer: Self-pay

## 2023-08-09 ENCOUNTER — Ambulatory Visit: Payer: Medicare Other | Admitting: Physical Therapy

## 2023-08-09 LAB — URINE CULTURE
MICRO NUMBER:: 15329746
Result:: NO GROWTH
SPECIMEN QUALITY:: ADEQUATE

## 2023-08-09 LAB — IRON,TIBC AND FERRITIN PANEL
%SAT: 9 % — ABNORMAL LOW (ref 16–45)
Ferritin: 8 ng/mL — ABNORMAL LOW (ref 16–288)
Iron: 27 ug/dL — ABNORMAL LOW (ref 45–160)
TIBC: 307 ug/dL (ref 250–450)

## 2023-08-15 ENCOUNTER — Ambulatory Visit: Payer: Medicare Other | Admitting: Rehabilitative and Restorative Service Providers"

## 2023-08-15 ENCOUNTER — Encounter: Payer: Self-pay | Admitting: Rehabilitative and Restorative Service Providers"

## 2023-08-15 ENCOUNTER — Encounter: Payer: Medicare Other | Admitting: Rehabilitative and Restorative Service Providers"

## 2023-08-15 DIAGNOSIS — D631 Anemia in chronic kidney disease: Secondary | ICD-10-CM | POA: Diagnosis not present

## 2023-08-15 DIAGNOSIS — R2681 Unsteadiness on feet: Secondary | ICD-10-CM | POA: Diagnosis not present

## 2023-08-15 DIAGNOSIS — I129 Hypertensive chronic kidney disease with stage 1 through stage 4 chronic kidney disease, or unspecified chronic kidney disease: Secondary | ICD-10-CM | POA: Diagnosis not present

## 2023-08-15 DIAGNOSIS — H8112 Benign paroxysmal vertigo, left ear: Secondary | ICD-10-CM | POA: Diagnosis not present

## 2023-08-15 DIAGNOSIS — N183 Chronic kidney disease, stage 3 unspecified: Secondary | ICD-10-CM | POA: Diagnosis not present

## 2023-08-15 DIAGNOSIS — M542 Cervicalgia: Secondary | ICD-10-CM | POA: Diagnosis not present

## 2023-08-15 DIAGNOSIS — M6281 Muscle weakness (generalized): Secondary | ICD-10-CM

## 2023-08-15 DIAGNOSIS — R42 Dizziness and giddiness: Secondary | ICD-10-CM

## 2023-08-15 DIAGNOSIS — E875 Hyperkalemia: Secondary | ICD-10-CM | POA: Diagnosis not present

## 2023-08-15 DIAGNOSIS — R252 Cramp and spasm: Secondary | ICD-10-CM | POA: Diagnosis not present

## 2023-08-15 LAB — LAB REPORT - SCANNED: EGFR: 50

## 2023-08-15 NOTE — Therapy (Unsigned)
OUTPATIENT PHYSICAL THERAPY NEURO TREATMENT   Patient Name: [name] MRN: [id number] DOB:04-19-1946, 77 y.o., female Today's Date: 08/15/2023  PCP: Danise Edge, MD REFERRING PROVIDER: Danise Edge, MD END OF SESSION:    PT End of Session - 08/15/23 0929     Visit Number 14    Number of Visits 16    Date for PT Re-Evaluation 08/30/23    Authorization Type [employer] medicare    PT Start Time 0930    PT Stop Time 1014    PT Time Calculation (min) 44 min    Activity Tolerance Patient tolerated treatment well    Behavior During Therapy WFL for tasks assessed/performed            Past Medical History:  Diagnosis Date   Allergy    Anemia    Anxiety    past hx    Arthritis    Asthma    Atrial flutter (HCC)    past history- not current   Breast cancer (HCC)    CAD (coronary artery disease) CARDIOLOGIST--  DR Clifton James   mild non-obstructive cad   Cancer (HCC)    right   Cataract    bilaterally removed    Chronic constipation    Chronic kidney disease    stage 3 ckd, interstitial cystitis   Concussion    x 3   COPD (chronic obstructive pulmonary disease) (HCC)    Depression    past hx    Dyspnea    Dysrhythmia    Family history of adverse reaction to anesthesia    Family history of malignant hyperthermia    father had this   Fibromyalgia    Frequency of urination    GERD (gastroesophageal reflux disease)    H/O hiatal hernia    History of basal cell carcinoma excision    X2   History of breast cancer ONCOLOGIST-- DR Darnelle Catalan---  NO RECURRANCE   DX 07/2012;  LOW GRADE DCIS  ER+PR+  ----  S/P RIGHT LUMPECTOMY WITH NEGATIVE MARGINS/   RADIATION ENDED 11/2012   History of chronic bronchitis    History of colonic polyps    History of tachycardia    CONTROLLED  WITH ATENOLOL   Hyperlipidemia    Hypertension    Neuromuscular disorder (HCC)    fibromyalgia   Neuropathy    Osteoporosis 01/2019   T score -2.2 stable/improved from prior study   Pelvic pain     Personal history of radiation therapy    Pneumonia    S/P radiation therapy 11/12/12 - 12/05/12   right Breast   Sepsis (HCC) 2014   from UTI    Sinus headache    Sjogren's disease (HCC)    Urgency of urination    Past Surgical History:  Procedure Laterality Date   72 HOUR PH STUDY N/A 04/03/2016   Procedure: 24 HOUR PH STUDY;  Surgeon: Ruffin Frederick, MD;  Location: [facility];  Service: Gastroenterology;  Laterality: N/A;   BREAST BIOPSY Right 08/23/2012   ADH   BREAST BIOPSY Right 10/11/2012   Ductal Carcinoma   BREAST EXCISIONAL BIOPSY Right 09/04/2013   benign   BREAST LUMPECTOMY Right 10/11/2012   W/ SLN BX   CARDIAC CATHETERIZATION  09-13-2007  DR Riley Kill   WELL-PRESERVED LVF/  DIFFUSE SCATTERED CORONARY CALCIFACATION AND ATHEROSCLEROSIS WITHOUT OBSTRUCTION   CARDIAC CATHETERIZATION  08-04-2010  DR Clifton James   NON-OBSTRUCTIVE CAD/  pLAD 40%/  oLAD 30%/  mLAD 30%/  pRCA 30%/  EF 60%  CARDIOVASCULAR STRESS TEST  06-18-2012  DR McALHANY   LOW RISK NUCLEAR STUDY/  SMALL FIXED AREA OF MODERATELY DECREASED UPTAKE IN ANTEROSEPTAL WALL WHICH MAY BE ARTIFACTUAL/  NO ISCHEMIA/  EF 68%   COLONOSCOPY  09/29/2010   CYSTOSCOPY     CYSTOSCOPY WITH HYDRODISTENSION AND BIOPSY N/A 03/06/2014   Procedure: CYSTOSCOPY/HYDRODISTENSION/ INSTILATION OF MARCAINE AND PYRIDIUM;  Surgeon: Kathi Ludwig, MD;  Location: [facility];  Service: Urology;  Laterality: N/A;   CYSTOSCOPY/URETEROSCOPY/HOLMIUM LASER/STENT PLACEMENT Left 03/21/2023   Procedure: CYSTOSCOPY LEFT RETROGRADE PYELOGRAM, LEFT URETEROSCOPY, HOLMIUM LASER LITHOTRIPSYM, AND LEFT URETERAL STENT PLACEMENT;  Surgeon: Rene Paci, MD;  Location: [facility];  Service: Urology;  Laterality: Left;  60 MINUTES   ESOPHAGEAL MANOMETRY N/A 04/03/2016   Procedure: ESOPHAGEAL MANOMETRY (EM);  Surgeon: Ruffin Frederick, MD;  Location: [facility];  Service: Gastroenterology;  Laterality: N/A;    EXTRACORPOREAL SHOCK WAVE LITHOTRIPSY Left 02/06/2019   Procedure: EXTRACORPOREAL SHOCK WAVE LITHOTRIPSY (ESWL);  Surgeon: Ihor Gully, MD;  Location: [facility];  Service: Urology;  Laterality: Left;   NASAL SINUS SURGERY  1985   ORIF RIGHT ANKLE  FX  2006   POLYPECTOMY     REMOVAL VOCAL CORD CYST  01/2013   RIGHT BREAST BX  08/23/2012   RIGHT HAND SURGERY  X3  LAST ONE 2009   INCLUDES  ORIF RIGHT 5TH FINGER AND REVISION TWICE   SKIN CANCER EXCISION     face,arms   TONSILLECTOMY AND ADENOIDECTOMY  AGE 80   TOTAL ABDOMINAL HYSTERECTOMY W/ BILATERAL SALPINGOOPHORECTOMY  1982   W/  APPENDECTOMY   TRANSTHORACIC ECHOCARDIOGRAM  06/24/2012   GRADE I DIASTOLIC DYSFUNCTION/  EF 55-60%/  MILD MR   UPPER GASTROINTESTINAL ENDOSCOPY     Patient Active Problem List   Diagnosis Date Noted   Multiple drug allergies 05/09/2023   Recurrent infections 05/09/2023   Rash and other nonspecific skin eruption 05/09/2023   Local reaction to hymenoptera sting 05/09/2023   Allergic reaction 05/09/2023   Other adverse food reactions, not elsewhere classified, subsequent encounter 05/09/2023   Weight loss 05/01/2023   Common bile duct dilatation 02/13/2023   Oral herpes 02/05/2023   Tongue lesion 02/05/2023   Drug reaction 02/05/2023   Cellulitis of breast 02/05/2023   Elevated alkaline phosphatase level 01/25/2023   Facial trauma 01/22/2023   Vertigo 01/22/2023   Intertrigo 12/13/2022   Hyperkalemia 09/28/2022   Anemia 09/28/2022   Tremor 07/05/2022   RUQ pain 07/05/2022   Leukocytosis 07/05/2022   Chronic renal insufficiency 03/21/2022   Sjogren's disease (HCC) 12/12/2021   Vocal cord cyst 05/18/2021   High serum vitamin D 05/18/2021   Dysuria 03/09/2021   Vitamin B12 deficiency 03/08/2021   Preventative health care 03/08/2021   Hyperglycemia 03/08/2021   Low back pain 09/08/2020   OSA and COPD overlap syndrome (HCC) 06/10/2020   Recurrent falls 05/02/2020   Statin intolerance 02/29/2020    RLS (restless legs syndrome) 11/09/2019   Kidney stone 02/26/2019   Recurrent sinusitis 02/25/2019   Hx of colonic polyp 02/25/2019   DDD (degenerative disc disease), cervical 09/17/2018   Degenerative scoliosis 04/22/2018   Primary insomnia 06/25/2017   Chronic interstitial cystitis 02/01/2017   Chronic cough 01/09/2017   Right arm pain 07/20/2016   Deviated septum 05/12/2016   Nasal turbinate hypertrophy 05/12/2016   Dysphagia    Allergic rhinitis 01/25/2016   Other and combined forms of senile cataract 07/15/2015   Dyspnea    Left hip pain 04/30/2015  Sciatica 04/30/2015   Knee pain, right 04/30/2015   Bilateral carpal tunnel syndrome 03/04/2015   Hyperlipidemia 03/01/2015   Pulmonary nodules 02/12/2015   External hemorrhoids 02/05/2015   Chronic night sweats 01/08/2015   Atypical chest pain 01/01/2015   Memory difficulties 05/22/2014   Peripheral neuropathy 05/22/2014   Abdominal pain 10/26/2013   Headache 10/13/2013   Arthralgia 08/18/2013   ANA positive 07/29/2013   Depression 02/05/2013   Family history of malignant hyperthermia 02/05/2013   Malignant neoplasm of lower-outer quadrant of right breast of female, estrogen receptor positive (HCC) 01/03/2013   Splenic lesion 09/02/2012   Asthma, allergic 08/05/2012   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   GERD (gastroesophageal reflux disease) 06/03/2012   Edema 04/08/2012   Stricture and stenosis of esophagus 10/19/2011   Constipation 07/21/2011   Nonspecific (abnormal) findings on radiological and other examination of biliary tract 07/21/2011   Osteoporosis 04/17/2011   Leg pain 02/24/2011   FIBROCYSTIC BREAST DISEASE, HX OF 10/18/2010   Essential hypertension 08/25/2010   CAD in native artery 08/25/2010   TOBACCO ABUSE, HX OF 08/25/2010   GENERALIZED ANXIETY DISORDER 08/03/2010   Breast pain 01/11/2010   Fibromyalgia 01/11/2010    ONSET DATE: 04/24/23 REFERRING DIAG:  R42 (ICD-10-CM) - Dizzy spells  R53.1  (ICD-10-CM) - Weakness   THERAPY DIAG:  Unsteadiness on feet  Dizziness and giddiness  Muscle weakness (generalized)  Rationale for Evaluation and Treatment: Rehabilitation  SUBJECTIVE:                                                                                                                                                                                           SUBJECTIVE STATEMENT: The patient reports she is doing better with dizziness and the exercises, but hasn't done the exercises a lot this week. Her head is hurting this week-- she denies hitting her head during recent fall. She feels soreness throughout all of her musculature-- reporting she is unsure if it is her fibromyalgia. She is wearing L foot brace today due to recent tear of foot ligament secondary to a fall. "Right now I'm hurting everywhere." She is questioning a kidney stone and seeing doctor today. She reports that turns are hard from dizziness.  Pt accompanied by: self PERTINENT HISTORY: chronic back pain, (scheduled for back ablation in June 2024), h/o vertigo, * new fall on July 4 with ligament injury in foot PAIN:  Are you having pain? Yes: NPRS scale: 8/10 headache, 8/10 "everywhere"  she can't sit, she can't sleep Pain location: "everywhere" and headache  Pain description: chronic Aggravating factors: PT will monitor Relieving factors: feels better when she moves around  during the day PRECAUTIONS: Fall WEIGHT BEARING RESTRICTIONS: No FALLS: Has patient fallen in last 6 months? Yes. Number of falls 3 LIVING ENVIRONMENT: Lives with: lives with their spouse Lives in: House/apartment Stairs: One step with one rail PLOF: Independent with basic ADLs-- notes declining mobility.  PATIENT GOALS: "stop the spinning and be able to walk and not falter."  OBJECTIVE:  (Measures in this section from initial evaluation unless otherwise noted) COGNITION: Overall cognitive status:  Patient mentions memory changes  since first fall where she hit her head -- she is unable to recall date SENSATION:  Patient reports numbness and cold sensations in feet. LOWER EXTREMITY MMT:   MMT Right Eval Left Eval  Hip flexion 3+/5 4/5  Knee flexion 4/5 4/5  Knee extension 5/5 5/5  Ankle dorsiflexion 3+/5 3+/5  Ankle plantarflexion    Ankle inversion    Ankle eversion    (Blank rows = not tested) BED MOBILITY:  Sit to supine Complete Independence Supine to sit Complete Independence Rolling to Right Complete Independence Rolling to Left Complete Independence *notes "I'm in a fog" with returning to sitting GAIT: Gait pattern:  slowed pace with SPC and wide BOS Distance walked: 100 ft Assistive device utilized: Single point cane FUNCTIONAL TESTS:  Berg= 40/56  UPDATE: 50/56 ON 06/01/23 5 time sit to stand=20.46 seconds with Ues on legs 76M=0.64 m/sec or 2.10 ft/sec  UPDATE: 3.44 ft/sec ON 06/01/23 PATIENT SURVEYS:  FOTO n/a--not measured  due to vertigo/balance  VESTIBULAR ASSESSMENT: GENERAL OBSERVATION: Gets intermittent dizziness SYMPTOM BEHAVIOR:  Subjective history: Has intermittent h/o vertigo  Non-Vestibular symptoms:  none  Type of dizziness: Spinning/Vertigo, Unsteady with head/body turns, and Lightheadedness/Faint  Frequency: daily   Duration: seconds to minutes  Aggravating factors: Worse in the morning and with getting up after sitting for longer periods  Relieving factors: unsure  Progression of symptoms: unchanged OCULOMOTOR EXAM:  Ocular Alignment: normal  Ocular ROM: No Limitations  Spontaneous Nystagmus: absent  Gaze-Induced Nystagmus: absent  Smooth Pursuits: intact but notes target seems "out of reach"   Saccades: intact VESTIBULAR - OCULAR REFLEX:   Slow VOR: Comment: 2 reps provokes a sensation of bad spinning  Head-Impulse Test:  Did not test today due to inability to tolerate slow VOR  Dynamic Visual Acuity: to be assessed POSITIONAL TESTING: Right Dix-Hallpike: no  nystagmus Left Dix-Hallpike: no nystagmus Right Roll Test: TBA Left Roll Test: TBA Right Sidelying: no nystagmus Left Sidelying: no nystagmus *Notes symptoms with return to sitting.   [facility] Adult PT Treatment:                                                DATE: 08/15/23 VITALS: BP: 104/68 HR: 78 Therapeutic Exercise: Seated Sit<>stand x 10 reps Standing ER x 1# x 10 reps Reaching overhead 1# bilat, x 3 reps with pain in upper traps A/ROM overhead reaching for general stretching Neuromuscular re-ed: Eye/head coordination with quarter turns, then half turns with SBA Standing near chair back Feet narrow Eyes closed + narrow base of support Wide base with head turns horiz and vertical Provided close supervision to CGA during standing tasks near a chair for support Gait: Figure 8 walking using visual cues for spotting objects and SPC with CGA Self Care: She is reporting she hurts and even walking to the door when the doorbell rings is challenging. She is  also laying down more due to headache. She has not been to church in 5 weeks.  [facility] Adult PT Treatment:                                                DATE: 08/01/23 Neuromuscular re-ed: Feet together smooth pursuit 2x30 sec [email or web address] Feet together saccades 2x30 sec [email or web address] Feet together VOR x 1 horizontal and then vertical metronome 120 BPM, 130 BPM 2x30 sec each [email or web address] Self Care: Donning/doffing ankle brace Discussed PT POC due to pt with multiple diagnoses and referrals for therapy   [facility] Adult PT Treatment:                                                DATE: 07/25/23 Neuromuscular re-ed: Smooth pursuit seated 3x30 sec [email or web address] 2x30 sec  [email or web address] 2x30 sec [email or web address] Saccades seated 2x30 sec horizontal and vertical (post it note on wall) Seated VOR x 1 at 100 BPM, 110 BPM, 120 BPM x30 sec each horizontal and then vertical Self Care: Discussed progressions for home   [facility] Adult PT Treatment:                                                DATE: 07/19/23 Therapeutic Exercise: UT stretch x 30 sec B Levator scap stretch x 30 sec B Manual Therapy: STM  & TPR B suboccipitals, cervical paraspinals, upper traps Neuromuscular re-ed: Dix-hallpike (-) bilat Habituation Supine head turns x10  Supine head nods x10 Gaze stabilization Seated VOR x 1 horizontal and vertical x30 sec each at 100 BPM and then 110 BPM Smooth pursuit 2x30 sec horizontal and vertical 100 - 110 BPM Saccades 2x30 sec horizontal and vertical Vitals: Seated: 114/67 BP, HR 67   PATIENT EDUCATION: Education details: Updated HEP Person educated: Patient Education method: Explanation Education comprehension: verbalized understanding  HOME EXERCISE PROGRAM: Access Code: [email or web address] URL: [email or web address] Date: 06/29/2023 Prepared by: Carlynn Herald  Exercises - Sit to Stand with Armchair  - 2 x daily - 7 x weekly - 1 sets - 5 reps - Standing with Head Rotation  - 2 x daily - 7 x weekly - 1 sets - 10 reps - Seated Gaze Stabilization with Head Rotation  - 2 x daily - 7 x weekly - 1 sets - 10 reps - Seated Single Leg Hip Abduction with Resistance  - 1 x daily - 7 x weekly - 3 sets - 10 reps - Supine Lower Trunk Rotation  - 2 x daily - 7 x weekly - 1 sets - 10 reps - Standing Balance in Corner  - 1 x daily - 7 x weekly - 3 sets - 10 reps - Standing Balance in Corner with Eyes Closed  - 1 x daily - 7 x weekly - 3 sets - 10 reps - Corner Balance Feet Apart: Eyes Open With Head Turns   - 1 x daily - 7 x weekly - 3 sets - 10 reps - Standing with Head Nod  - 1 x daily - 7 x weekly - 3 sets - 10 reps  Access  Code: [email or web address] URL: [email or web address] Date: 08/15/2023 Prepared by: Margretta Ditty  Exercises - Seated Horizontal Smooth Pursuit  - 1 x daily - 7 x weekly - 2 sets - 30 sec hold - Seated Vertical Smooth Pursuit  - 1 x daily - 7 x weekly - 2 sets - 30 sec hold - Seated Vertical Saccades  - 1 x daily - 7 x weekly - 2 sets - 30 sec hold - Seated Horizontal Saccades  - 1 x daily - 7 x weekly - 2 sets - 30 sec hold - Seated Upper Trapezius Stretch  - 1 x daily - 7 x weekly - 2 sets - 30 sec hold - Gentle Levator Scapulae Stretch  - 1 x daily - 7 x weekly - 2 sets - 30 sec hold - Seated Gaze Stabilization with Head Rotation  - 1 x daily - 7 x weekly - 2 sets - 30 sec hold - Seated Gaze Stabilization with Head Nod  - 1 x daily - 7 x weekly - 2 sets - 30 sec hold - Sit to Stand with Armchair  - 2 x daily - 7 x weekly - 1 sets - 10 reps  GOALS: Goals reviewed with patient? Yes  SHORT TERM GOALS: Target date: 06/06/23  Patient will be independent with initial HEP.  Baseline: To be given Goal status: MET  2.  Patient will complete Berg, and FGA if indicated. Baseline: 40/56 (Berg) Goal status: MET   3.  Patient will report decreased dizziness by 50%. Baseline: n/a Goal status: MET  LONG TERM GOALS:  Target date: 08/30/2023   Patient will be independent with advanced/ongoing HEP to improve outcomes and carryover.  Baseline: to be established. Goal status: IN PROGRESS  2.  Patient will improve Berg balance score by 5 points to demo improved functional balance.  Baseline: 40/56; Berg=50/56  Goal status: MET ON 06/01/23  3. Patient will demo floor<>stand with UE support mod indep for improved functional strength.  Baseline: has h/o falls.  Goal status: MET  4. Patient will improve gait speed to 2.8 ft/sec to demo improving mobility and dec'd risk  for falls.  Baseline: 3.44 ft/sec  Goal status: MET ON 06/01/23  ASSESSMENT:  CLINICAL IMPRESSION: The patient is progressing throughout session with dec'ing dizziness with standing balance and habituation activities. PT focused on using visual cues to help decrease dizziness and improve balance during functional tasks. Plan to continue working to LTGs.   OBJECTIVE IMPAIRMENTS: Abnormal gait, decreased activity tolerance, decreased balance, decreased strength, dizziness, impaired sensation, and pain.   PLAN:  PT FREQUENCY: 2x/week  PT DURATION: 6 weeks  PLANNED INTERVENTIONS: Therapeutic exercises, Therapeutic activity, Neuromuscular re-education, Balance training, Gait training, Patient/Family education, Self Care, Joint mobilization, Vestibular training, Canalith repositioning, Aquatic Therapy, Dry Needling, Electrical stimulation, Moist heat, Taping, and Manual therapy  PLAN FOR NEXT SESSION: Multi-tasking activities. Progress standing corner balance (narrowing BOS with head turns, EC with static head turns), single leg control, turning towards the right. Progress gait activities to tolerance, LE strength, balance  Renew on 9/5 or after visit 16 (whichever comes first)-- needs LTG update at that time.  Lynden Carrithers, PT 08/15/2023 10:21 AM

## 2023-08-19 IMAGING — MR MR PELVIS WO/W CM
8 of 10 series · 37 of 48 positions shown · IV contrast (gadavist)
Comparison: None.

CLINICAL DATA: Chronic rectal bleeding. Previous hysterectomy for
cervical carcinoma. Clinical suspicion for rectovaginal fistula.

EXAM:
MRI PELVIS WITHOUT AND WITH CONTRAST
TECHNIQUE: Multiplanar multisequence MR imaging of the pelvis was performed
both before and after administration of intravenous contrast.
CONTRAST:  6mL GADAVIST GADOBUTROL 1 MMOL/ML IV SOLN

[Series 3: T2 · sagittal · 2.5mm · 1.02mm/px · 5 of 45 slices shown (1 of 3)]
[im 1/45]
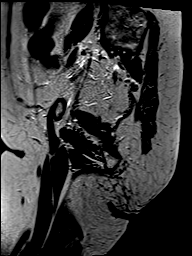
[im 12/45]
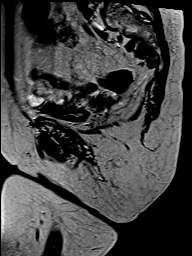
[im 23/45]
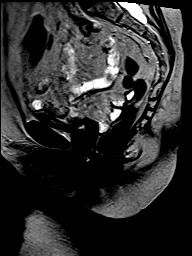
[im 34/45]
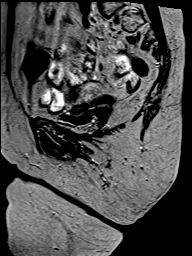
[im 45/45]
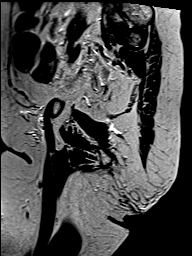

[Series 4: T2 fat-sat · sagittal · 2.5mm · 1.02mm/px · 6 of 45 slices shown (1 of 2)]
[im 1/45]
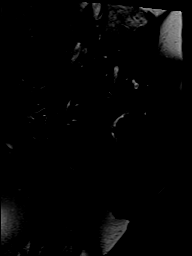
[im 9/45]
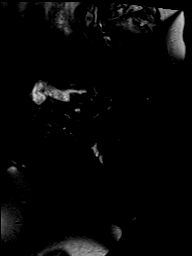
[im 18/45]
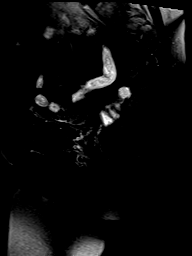
[im 27/45]
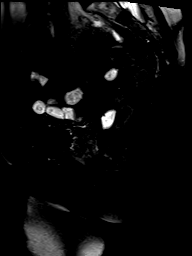
[im 36/45]
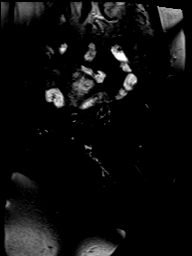
[im 45/45]
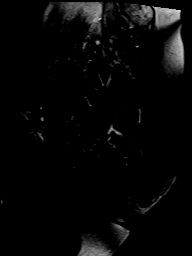

[Series 7: T1 · axial · 4.0mm · 0.43mm/px · z∈[-207,-73]mm · 5 of 33 slices shown]
[im 1/33]
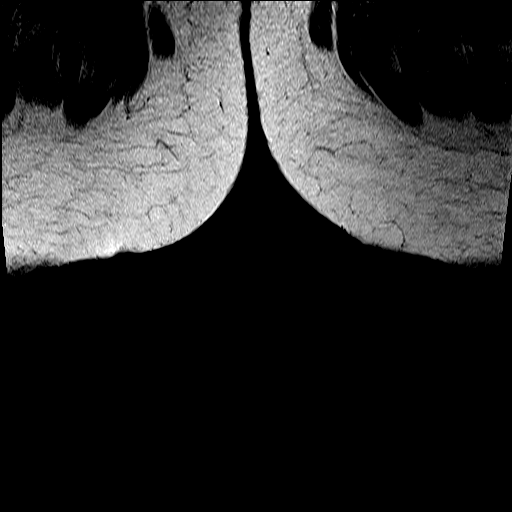
[im 9/33]
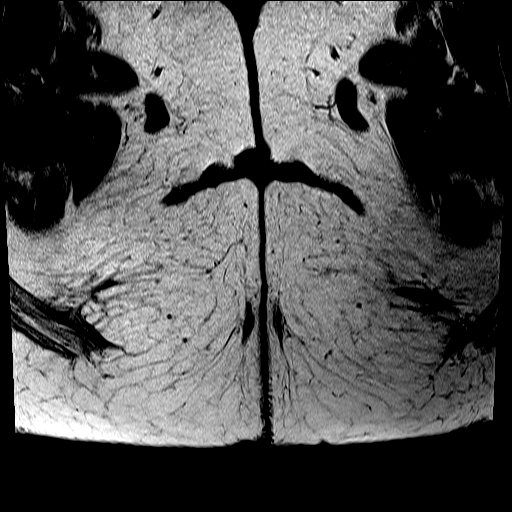
[im 17/33]
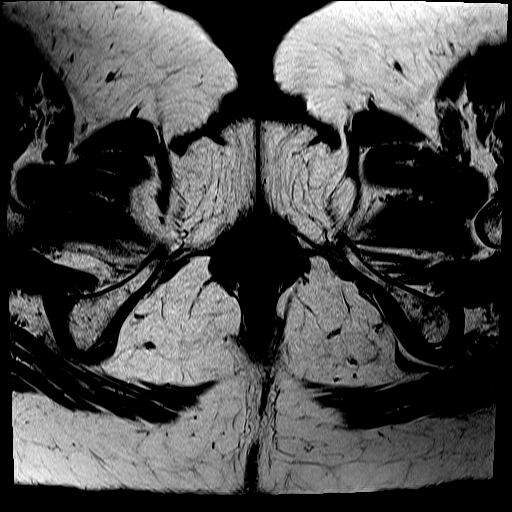
[im 25/33]
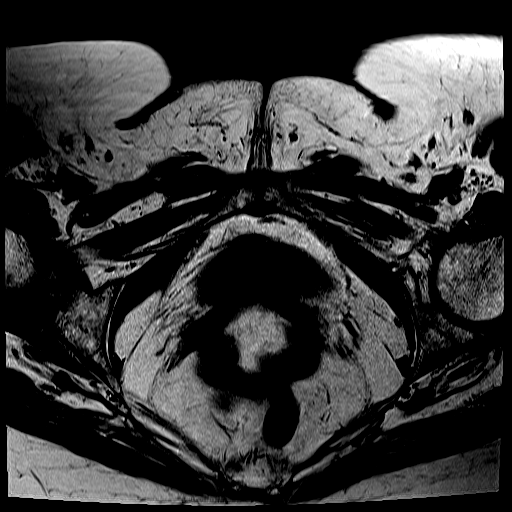
[im 33/33]
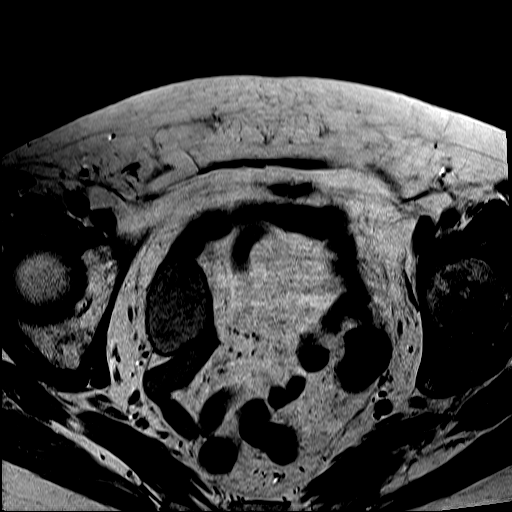

[Series 8: T2 · coronal · 4.0mm · 0.47mm/px · 4 of 27 slices shown (2 of 3)]
[im 1/27]
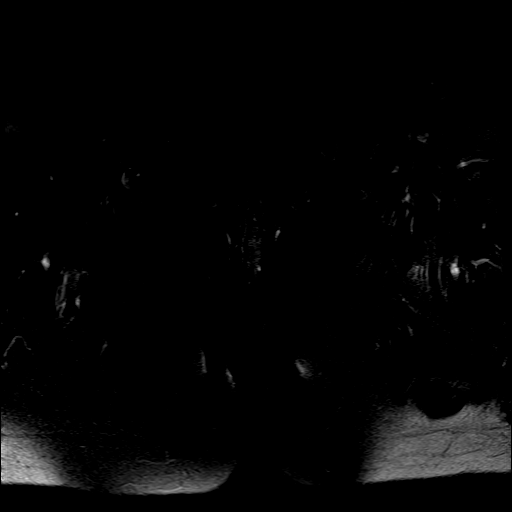
[im 9/27]
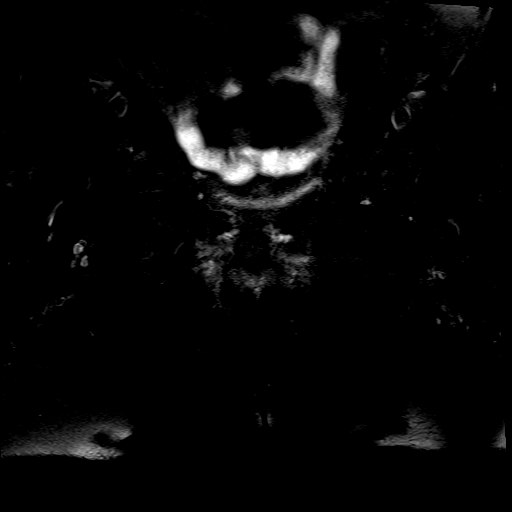
[im 18/27]
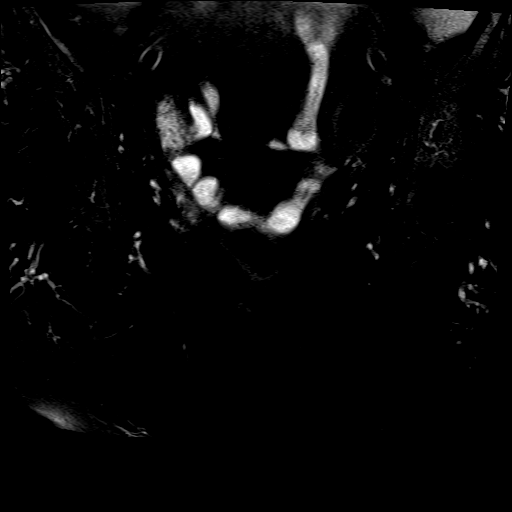
[im 27/27]
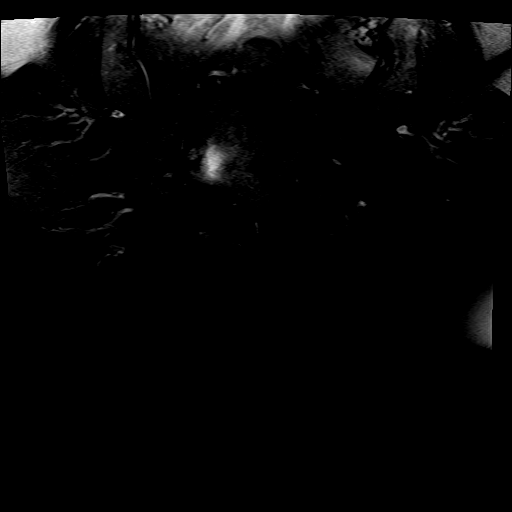

[Series 9: T2 · axial · 4.0mm · 0.86mm/px · z∈[-207,-73]mm · 5 of 33 slices shown (3 of 3)]
[im 1/33]
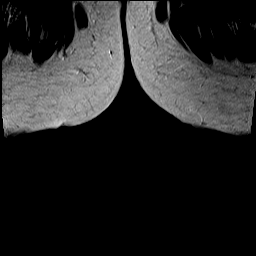
[im 9/33]
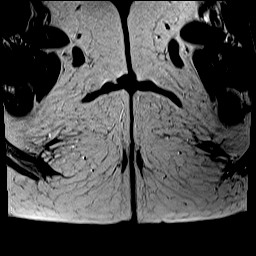
[im 17/33]
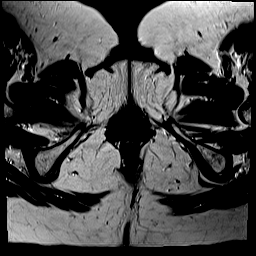
[im 25/33]
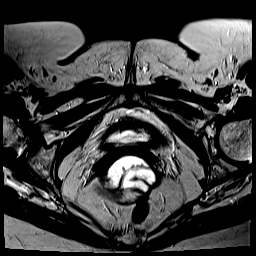
[im 33/33]
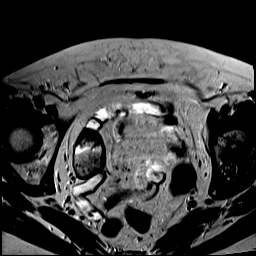

[Series 10: T2 fat-sat · axial · 4.0mm · 0.86mm/px · z∈[-207,-73]mm · 5 of 33 slices shown (2 of 2)]
[im 1/33]
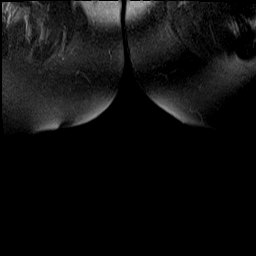
[im 9/33]
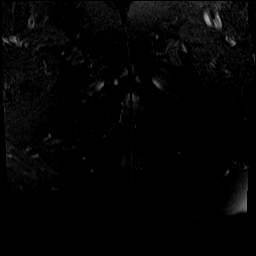
[im 17/33]
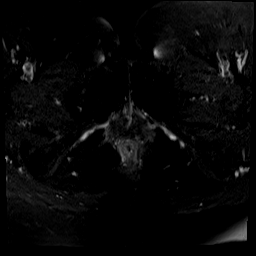
[im 25/33]
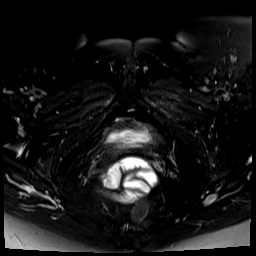
[im 33/33]
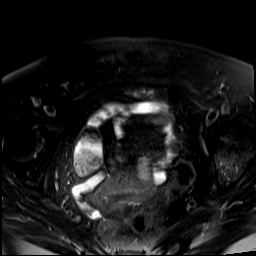

[Series 11: T1 fat-sat · axial · non-contrast · 4.0mm · 0.57mm/px · z∈[-207,-73]mm · 5 of 33 slices shown (1 of 2)]
[im 1/33]
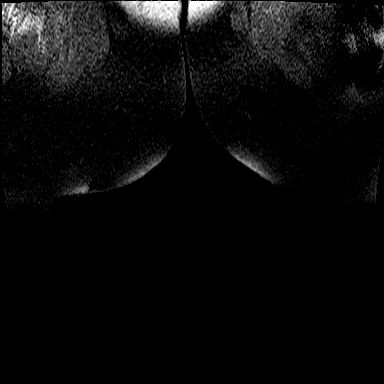
[im 9/33]
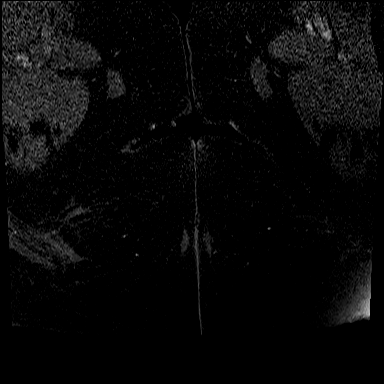
[im 17/33]
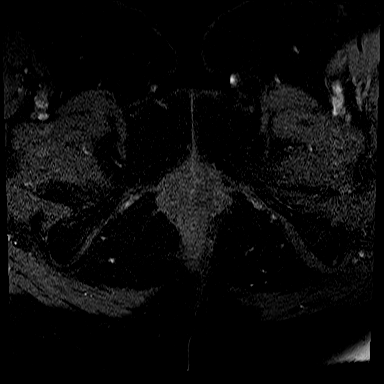
[im 25/33]
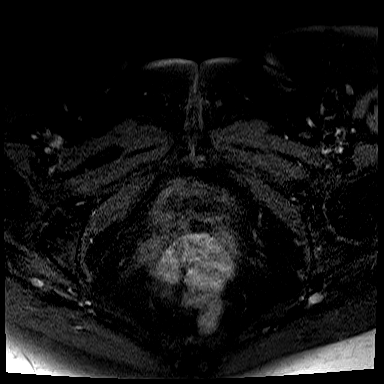
[im 33/33]
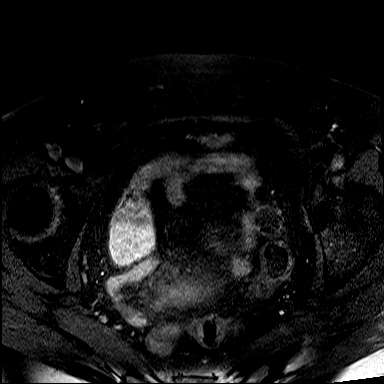

[Series 12: T1 fat-sat · coronal · non-contrast · 4.0mm · 0.43mm/px · 2 of 30 slices shown (2 of 2)]
[im 1/30]
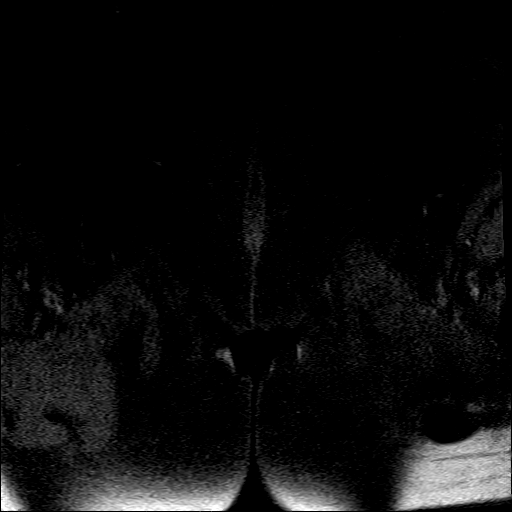
[im 10/30]
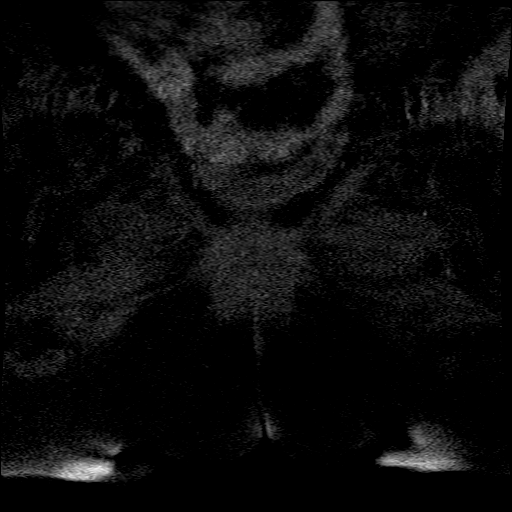

[37 of 48 positions shown; findings below may reference images not displayed]

FINDINGS: Lower Urinary Tract: Unremarkable urinary bladder and urethra.

Bowel: Fat plane is maintained between the vaginal cuff and rectum.
No evidence rectovaginal fistula or abnormal fluid collections.
Rectum and other pelvic bowel loops are unremarkable.No evidence of
perianal fistula or abscess.

Vascular/Lymphatic: No pathologically enlarged lymph nodes or other
significant abnormality seen in lower pelvis.

Reproductive: Prior hysterectomy. Vaginal cuff is normal in
appearance. Normal fat plane is maintained between the vaginal cuff
and rectum, and there is no evidence of rectovaginal fistula or
abnormal fluid collections.

Other: None.

Musculoskeletal: No significant abnormality identified.
IMPRESSION: Prior hysterectomy. No evidence of rectovaginal fistula or other
significant abnormality.

## 2023-08-21 ENCOUNTER — Ambulatory Visit: Payer: Medicare Other | Admitting: Psychology

## 2023-08-21 ENCOUNTER — Encounter: Payer: Self-pay | Admitting: Psychology

## 2023-08-21 DIAGNOSIS — M797 Fibromyalgia: Secondary | ICD-10-CM

## 2023-08-21 DIAGNOSIS — R251 Tremor, unspecified: Secondary | ICD-10-CM

## 2023-08-21 DIAGNOSIS — F411 Generalized anxiety disorder: Secondary | ICD-10-CM | POA: Diagnosis not present

## 2023-08-21 DIAGNOSIS — R4189 Other symptoms and signs involving cognitive functions and awareness: Secondary | ICD-10-CM

## 2023-08-21 DIAGNOSIS — F09 Unspecified mental disorder due to known physiological condition: Secondary | ICD-10-CM

## 2023-08-21 DIAGNOSIS — F331 Major depressive disorder, recurrent, moderate: Secondary | ICD-10-CM

## 2023-08-21 DIAGNOSIS — N183 Chronic kidney disease, stage 3 unspecified: Secondary | ICD-10-CM | POA: Insufficient documentation

## 2023-08-21 NOTE — Progress Notes (Unsigned)
NEUROPSYCHOLOGICAL EVALUATION [place]. [facility] [place] Department of Neurology  Date of Evaluation: August 21, 2023  Reason for Referral:   [name] is a 77 y.o. right-handed Caucasian female referred by Marlowe Kays, PA-C, to characterize her current cognitive functioning and assist with diagnostic clarity and treatment planning in the context of subjective cognitive decline.   Assessment and Plan:   Clinical Impression(s): Outward anxiety was noted to be very high throughout the testing process. For example, behavioral observations were noteworthy for [name] standing upwards, pacing, running her hands through her hair, and exhibiting heavy breathing during task administration. This was prevalent from the initial task, a very basic cognitive screening instrument (SLUMS), and persisted throughout much of the testing session. Anxiety levels did appear to mildly diminish as the evaluation progressed.  With interpretation being performed with this in mind, [name]'s pattern of performance is suggestive of a relative weakness surrounding verbal fluency (both phonemic and semantic). She also exhibited very slight performance variability across both executive functioning and visuospatial abilities. However, said variability amounted to a single low score across each domain coupled with numerous intact performances, suggesting that this degree of variability is quite minimal and these domains are largely within normal limits. Performances were appropriate relative to age-matched peers across processing speed, attention/concentration, receptive language, confrontation naming, and all aspects of learning and memory. [name] denied prominent difficulties completing instrumental activities of daily living (ADLs) independently. I do not feel that she warrants a formal diagnosis surrounding a neurocognitive disorder at the present time.   Specific to memory, despite her  subjective report of severe memory impairment and that she "cannot remember anything," performances across memory testing were consistently appropriate and not suggestive of abnormal decline relative to her prior evaluation in 2017. [name] was able to learn novel verbal information efficiently and retain this knowledge after lengthy delays. Overall, memory performance combined with intact performances across other areas of cognitive functioning is not suggestive of symptomatic Alzheimer's disease.   While she did describe some REM sleep behaviors and questionably fully-formed visual hallucinations occurring during the past 6-8 months, testing patterns are not strongly suggestive of Lewy body disease. She did also describe ongoing tremors and balance instability. Current testing patterns are not inconsistent with what can be seen in Parkinson's disease. However, tremors were described by Ms. Wach as non-resting and have previously been theorized as essential tremor, balance instability was largely attributed to peripheral neuropathy, and sleep behaviors and potential hallucinations may be better accounted for by ongoing psychiatric distress (see below) and medication side effects. However, continued monitoring would be prudent in this regard.  Responses across mood-related questionnaires suggested acute levels of moderate depression and anxiety within the past 1-2 weeks, as well as mild sleep dysfunction. This mirrors her report during interview, as well as behavioral observations throughout testing. Prominent psychiatric distress can absolutely have a negative impact across cognitive functioning, both across a testing environment, as well as in her day-to-day life. Deficits with attention/concentration, executive functioning, verbal fluency, and encoding/retrieval aspects of memory are very commonly reported. Ms. Quinlin further described a history of fibromyalgia/chronic pain and significant fatigue. She  is also on a very large number of medications (if the current medication list in her chart is accurate), increasing the likelihood for a polypharmacy component to her current presentation. Given current evidence, the combination of these factors would appear to be the likely explanation for day-to-day subjective impairment. Continued medical monitoring will be important  moving forward.   Recommendations: A repeat neuropsychological evaluation in 24-36 months is recommended to assess the trajectory of future cognitive decline should it occur. This will also aid in future efforts towards improved diagnostic clarity.  If her neurologist or PCP has greater concerns surrounding a parkinsonian presentation, they could discuss an alpha-synuclein skin biopsy or DaTscan with [name] to better rule in or out this sort of presentation.  Given the severity of ongoing psychiatric distress, [name] is encouraged to speak with her prescribing physician regarding medication adjustments to optimally manage these symptoms. Given symptom severity, she may benefit from being seen by a psychiatrist. She could seek a referral from her PCP or contact some of these clinicians to see if they are accepting new patients:  Dr. Ardeth Sportsman - [phone] [facility]) - [phone] [facility] [place]) - [phone] Dr. Milagros Evener [facility]) [phone] [facility] [place]) [phone] [facility] [place]) - [phone] [facility]) [phone] [facility], [address], [place], [place] [phone] Dr. Dionisio David (neuropsychiatry); Franchot Erichsen; [facility]; 9th Floor; [place], [place] [phone] Dr. Neysa Hotter; [facility]; [address]; [place], [place] [phone]  [name] is also  strongly encouraged to consider engaging in short-term psychotherapy to address symptoms of psychiatric distress. She would benefit from an active and collaborative therapeutic environment, rather than one purely supportive in nature. Recommended treatment modalities include Cognitive Behavioral Therapy (CBT) or Acceptance and Commitment Therapy (ACT).  As stated above, [name]'s current medication list is quite lengthy and I do have concerns surrounding the impact of polypharmacy. I would encourage her to seek out consult with an independent pharmacist to determine if all her medications are truly necessary/advised and to understand ongoing interactions. Her psychiatrist may be able to perform this should she decide to establish care with a provider.   Performance across neurocognitive testing is not a strong predictor of an individual's safety operating a motor vehicle. Should her family wish to pursue a formalized driving evaluation, they could reach out to the following agencies: [employer] [facility] in [place]: [phone] [facility]: [phone] [facility]: [phone] [facility]: [phone] or [phone]  Ms. Growden is encouraged to attend to lifestyle factors for brain health (e.g., regular physical exercise, good nutrition habits and consideration of the MIND-DASH diet, regular participation in cognitively-stimulating activities, and general stress management techniques), which are likely to have benefits for both emotional adjustment and cognition. In fact, in addition to promoting good general health, regular exercise incorporating aerobic activities (e.g., brisk walking, jogging, cycling, etc.) has been demonstrated to be a very effective treatment for depression and stress, with similar efficacy rates to both antidepressant medication and psychotherapy. Optimal control of vascular risk factors (including safe cardiovascular exercise and  adherence to dietary recommendations) is encouraged. Continued participation in activities which provide mental stimulation and social interaction is also recommended.   Memory can be improved using internal strategies such as rehearsal, repetition, chunking, mnemonics, association, and imagery. External strategies such as written notes in a consistently used memory journal, visual and nonverbal auditory cues such as a calendar on the refrigerator or appointments with alarm, such as on a cell phone, can also help maximize recall.    Reducing anxiety should aid in the retrieval of information. She is encouraged to prepare scripts she can use socially when she experiences difficulty with word finding or memory. Such  scripts should be brief explanations of the difficulty (e.g., "the word escapes me now") and allow her to move the conversation forward quickly rather than dwelling on the issue.  To address problems with fluctuating attention and/or executive dysfunction, she may wish to consider:   -Avoiding external distractions when needing to concentrate   -Limiting exposure to fast paced environments with multiple sensory demands   -Writing down complicated information and using checklists   -Attempting and completing one task at a time (i.e., no multi-tasking)   -Verbalizing aloud each step of a task to maintain focus   -Taking frequent breaks during the completion of steps/tasks to avoid fatigue   -Reducing the amount of information considered at one time   -Scheduling more difficult activities for a time of day where she is usually most alert  Review of Records:   [name] was seen by [facility] (Royston Sinner, M.D.) on 07/22/2014 for an evaluation of memory loss and other symptoms. Specific to cognition, se described ongoing memory concerns. Examples surrounded her becoming more reliant on her GPS for navigation, trouble putting things where they do not belong, and instances where she may go  to the computer to look something up and forget her intention once she gets there. Trouble with ADLs was denied. Dr. Hyacinth Meeker also described ongoing symptoms attributed to fibromyalgia and chronic pain, as well as depression and other psychiatric factors.   [name] completed a comprehensive neuropsychological evaluation [name], Psy.D.) on 09/27/2016. Results suggested intact performances across all assessed cognitive domains when compared to age-matched peers. Subjective day-to-day dysfunction was thought to most likely reflect polypharmacy, mood symptoms, and fibromyalgia. No concerns for Alzheimer's disease were expressed. No concerns surrounding prior concussive injuries were expressed.   [name] has been followed by several neurology practices over the years. She was most recently seen by [facility] Marlowe Kays, PA-C) on 05/30/2023 for an evaluation of memory loss. At that time, she described memory loss for the past two years following a fall where she hit her head. She noted increased word finding concerns and more frequently losing her train of thought. She further noted entering rooms and forgetting her intention, as well as instances where she might incorrectly place an object in her home. Trouble with ADLs was largely denied. Performance on a brief cognitive screening instrument (MOCA) was 25/30. Ultimately, [name] was referred for a comprehensive neuropsychological evaluation to characterize her cognitive abilities and to assist with diagnostic clarity and treatment planning.   Neuroimaging Head CT on 09/10/2007 in the context of headache symptoms was negative. Brain MRI on 09/13/2007 was unremarkable. Head CT on 05/11/2009 was negative. Brain MRI on 02/15/2013 was unremarkable. Brain MRI on 10/20/2015 revealed mild microvascular ischemic changes. Brain MRI on 10/11/2017 was stable. Brain MRI on 06/20/2023 was largely stable, suggesting slight progression of microvascular  ischemia.   Past Medical History:  Diagnosis Date   Abdominal pain 10/26/2013   Allergic rhinitis 01/25/2016   ANA positive 07/29/2013   Anemia    Arthralgia 08/18/2013   Arthritis    Asthma, allergic 08/05/2012   Atrial flutter    past history- not current   Atypical chest pain 01/01/2015   Bilateral carpal tunnel syndrome 03/04/2015   CAD in native artery 08/25/2010   Minor CAD 2009, 2011, low risk Myoview 2013 and 2016        Cataract    bilaterally removed    Cellulitis of breast 02/05/2023   Chronic constipation  Chronic cough 01/09/2017   Chronic interstitial cystitis 02/01/2017   Chronic night sweats 01/08/2015   Common bile duct dilatation 02/13/2023   Concussion    x 3   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   DDD (degenerative disc disease), cervical 09/17/2018   Degenerative scoliosis 04/22/2018   Deviated septum 05/12/2016   Dysphagia    Dyspnea    Dysuria 03/09/2021   Elevated alkaline phosphatase level 01/25/2023   Essential hypertension 08/25/2010   External hemorrhoids 02/05/2015   Facial trauma 01/22/2023   Family history of adverse reaction to anesthesia    Family history of malignant hyperthermia    father had this   Fibrocystic breast disease 10/18/2010   Fibromyalgia    Frequency of urination    Generalized anxiety disorder 08/03/2010   GERD (gastroesophageal reflux disease)    Headache 10/13/2013   High serum vitamin D 05/18/2021   History of chronic bronchitis    History of colonic polyp 02/25/2019   History of hiatal hernia    History of tachycardia    CONTROLLED  WITH ATENOLOL   Hyperglycemia 03/08/2021   Hyperkalemia 09/28/2022   Hyperlipidemia    Intertrigo 12/13/2022   Kidney stone 02/26/2019   Knee pain, right 04/30/2015   Left hip pain 04/30/2015   Leg pain 02/24/2011   Qualifier: Diagnosis of   By: Laury Axon DOMyrene Buddy         Leukocytosis 07/05/2022   Local reaction to hymenoptera sting 05/09/2023   Low back pain 09/08/2020   Major  depressive disorder 02/05/2013   Malignant neoplasm of lower-outer quadrant of right breast of female, estrogen receptor positive 01/03/2013   Nasal turbinate hypertrophy 05/12/2016   Oral herpes 02/05/2023   OSA and COPD overlap syndrome 06/10/2020   Osteoporosis 01/2019   T score -2.2 stable/improved from prior study   Pelvic pain    Peripheral neuropathy 05/22/2014   Personal history of radiation therapy    Pneumonia    Primary insomnia 06/25/2017   Pulmonary nodules 02/12/2015   Rash and other nonspecific skin eruption 05/09/2023   Recurrent falls 05/02/2020   Recurrent sinusitis 02/25/2019   Right arm pain 07/20/2016   RLS (restless legs syndrome) 11/09/2019   RUQ pain 07/05/2022   S/P radiation therapy 11/12/12 - 12/05/12   right Breast   Sciatica 04/30/2015   Sepsis 2014   from UTI    Sjogren's disease 12/12/2021   Splenic lesion 09/02/2012   Sprain of lateral ligament of ankle joint 07/31/2023   Stage 3 chronic kidney disease    Statin intolerance 02/29/2020   Stricture and stenosis of esophagus 10/19/2011   Tongue lesion 02/05/2023   Tremor 07/05/2022   Urgency of urination    Vertigo 01/22/2023   Vitamin B12 deficiency 03/08/2021   Vocal cord cyst 05/18/2021    Past Surgical History:  Procedure Laterality Date   24 HOUR PH STUDY N/A 04/03/2016   Procedure: 24 HOUR PH STUDY;  Surgeon: Ruffin Frederick, MD;  Location: [facility];  Service: Gastroenterology;  Laterality: N/A;   BREAST BIOPSY Right 08/23/2012   ADH   BREAST BIOPSY Right 10/11/2012   Ductal Carcinoma   BREAST EXCISIONAL BIOPSY Right 09/04/2013   benign   BREAST LUMPECTOMY Right 10/11/2012   W/ SLN BX   CARDIAC CATHETERIZATION  09-13-2007  DR Riley Kill   WELL-PRESERVED LVF/  DIFFUSE SCATTERED CORONARY CALCIFACATION AND ATHEROSCLEROSIS WITHOUT OBSTRUCTION   CARDIAC CATHETERIZATION  08-04-2010  DR Clifton James   NON-OBSTRUCTIVE CAD/  pLAD 40%/  oLAD 30%/  mLAD 30%/  pRCA 30%/  EF 60%    CARDIOVASCULAR STRESS TEST  06-18-2012  DR McALHANY   LOW RISK NUCLEAR STUDY/  SMALL FIXED AREA OF MODERATELY DECREASED UPTAKE IN ANTEROSEPTAL WALL WHICH MAY BE ARTIFACTUAL/  NO ISCHEMIA/  EF 68%   COLONOSCOPY  09/29/2010   CYSTOSCOPY     CYSTOSCOPY WITH HYDRODISTENSION AND BIOPSY N/A 03/06/2014   Procedure: CYSTOSCOPY/HYDRODISTENSION/ INSTILATION OF MARCAINE AND PYRIDIUM;  Surgeon: Kathi Ludwig, MD;  Location: [facility];  Service: Urology;  Laterality: N/A;   CYSTOSCOPY/URETEROSCOPY/HOLMIUM LASER/STENT PLACEMENT Left 03/21/2023   Procedure: CYSTOSCOPY LEFT RETROGRADE PYELOGRAM, LEFT URETEROSCOPY, HOLMIUM LASER LITHOTRIPSYM, AND LEFT URETERAL STENT PLACEMENT;  Surgeon: Rene Paci, MD;  Location: [facility];  Service: Urology;  Laterality: Left;  60 MINUTES   ESOPHAGEAL MANOMETRY N/A 04/03/2016   Procedure: ESOPHAGEAL MANOMETRY (EM);  Surgeon: Ruffin Frederick, MD;  Location: [facility];  Service: Gastroenterology;  Laterality: N/A;   EXTRACORPOREAL SHOCK WAVE LITHOTRIPSY Left 02/06/2019   Procedure: EXTRACORPOREAL SHOCK WAVE LITHOTRIPSY (ESWL);  Surgeon: Ihor Gully, MD;  Location: [facility];  Service: Urology;  Laterality: Left;   NASAL SINUS SURGERY  1985   ORIF RIGHT ANKLE  FX  2006   POLYPECTOMY     REMOVAL VOCAL CORD CYST  01/2013   RIGHT BREAST BX  08/23/2012   RIGHT HAND SURGERY  X3  LAST ONE 2009   INCLUDES  ORIF RIGHT 5TH FINGER AND REVISION TWICE   SKIN CANCER EXCISION     face,arms   TONSILLECTOMY AND ADENOIDECTOMY  AGE 30   TOTAL ABDOMINAL HYSTERECTOMY W/ BILATERAL SALPINGOOPHORECTOMY  1982   W/  APPENDECTOMY   TRANSTHORACIC ECHOCARDIOGRAM  06/24/2012   GRADE I DIASTOLIC DYSFUNCTION/  EF 55-60%/  MILD MR   UPPER GASTROINTESTINAL ENDOSCOPY      Current Outpatient Medications:    acetaminophen (TYLENOL) 500 MG tablet, Take 1,000 mg by mouth every 6 (six) hours as needed for mild pain or headache. , Disp: , Rfl:    albuterol (VENTOLIN  HFA) 108 (90 Base) MCG/ACT inhaler, Inhale 2 puffs into the lungs every 4 (four) hours as needed for wheezing or shortness of breath (coughing fits)., Disp: 18 g, Rfl: 1   amLODipine (NORVASC) 5 MG tablet, Take 1 tablet (5 mg total) by mouth daily., Disp: 90 tablet, Rfl: 3   aspirin EC 81 MG tablet, Take 1 tablet (81 mg total) by mouth daily., Disp: 90 tablet, Rfl: 3   atenolol (TENORMIN) 25 MG tablet, Take 1 tablet (25 mg total) by mouth 2 (two) times daily. Take 1 tablet by mouth 2 time a day, Disp: 180 tablet, Rfl: 3   CALCIUM PO, Take 500 mg by mouth daily., Disp: , Rfl:    carboxymethylcellul-glycerin (OPTIVE) 0.5-0.9 % ophthalmic solution, Place 1 drop into both eyes daily as needed for dry eyes., Disp: , Rfl:    Cholecalciferol (VITAMIN D3) 50 MCG (2000 UT) CAPS, Take 2,000 Units by mouth daily., Disp: , Rfl:    cyanocobalamin (VITAMIN B12) 1000 MCG tablet, Take 1,000 mg by mouth daily with breakfast., Disp: , Rfl:    diclofenac Sodium (VOLTAREN) 1 % GEL, Apply 2 g topically daily as needed (Back pain)., Disp: , Rfl:    diphenhydrAMINE (BENADRYL) 25 mg capsule, Take 25 mg by mouth every 6 (six) hours as needed for itching., Disp: , Rfl:    Docusate Sodium (DSS) 100 MG CAPS, Take 100 mg by mouth daily as needed (Constipation)., Disp: , Rfl:  DULoxetine (CYMBALTA) 60 MG capsule, Take 1 capsule by mouth twice daily, Disp: 180 capsule, Rfl: 0   EPINEPHrine 0.3 mg/0.3 mL IJ SOAJ injection, Inject 0.3 mg into the muscle as needed for anaphylaxis., Disp: 2 each, Rfl: 1   famotidine (PEPCID) 20 MG tablet, Take 1 tablet (20 mg total) by mouth 2 (two) times daily as needed for heartburn or indigestion., Disp: 60 tablet, Rfl: 1   fluticasone (FLONASE) 50 MCG/ACT nasal spray, Use 2 spray(s) in each nostril once daily (Patient taking differently: Place 2 sprays into both nostrils daily as needed for allergies or rhinitis.), Disp: 16 g, Rfl: 5   fluticasone (FLOVENT HFA) 110 MCG/ACT inhaler, Inhale 2 puffs  into the lungs 2 (two) times daily. (Patient taking differently: Inhale 2 puffs into the lungs daily as needed (Short of breath).), Disp: 1 each, Rfl: 5   fluticasone-salmeterol (ADVAIR) 250-50 MCG/ACT AEPB, Inhale 1 puff into the lungs in the morning and at bedtime., Disp: 1 each, Rfl: 2   folic acid (FOLVITE) 1 MG tablet, Take 1 tablet (1 mg total) by mouth daily., Disp: , Rfl:    furosemide (LASIX) 20 MG tablet, Take 1 tablet (20 mg total) by mouth as needed for edema., Disp: 90 tablet, Rfl: 3   gabapentin (NEURONTIN) 300 MG capsule, Take 300-900 mg by mouth See admin instructions. Take 600 mg in the morning 300 mg at lunch time and 900 mg at bedtime, Disp: , Rfl:    hydrOXYzine (ATARAX) 10 MG tablet, Take 1 tablet (10 mg total) by mouth 3 (three) times daily as needed., Disp: 30 tablet, Rfl: 1   linaclotide (LINZESS) 145 MCG CAPS capsule, Take 1 capsule (145 mcg total) by mouth daily as needed. Lot: [id number]  Exp:11-2023, Disp: 12 capsule, Rfl: 0   loratadine (CLARITIN) 10 MG tablet, Take 10 mg by mouth daily as needed for rhinitis., Disp: , Rfl:    Multiple Vitamins-Minerals (MULTIVITAMIN WITH MINERALS) tablet, Take 1 tablet by mouth daily. (Patient taking differently: Take 1 tablet by mouth daily. 50+), Disp: , Rfl:    nitroGLYCERIN (NITROSTAT) 0.4 MG SL tablet, Place 1 tablet (0.4 mg total) under the tongue every 5 (five) minutes as needed for chest pain., Disp: 25 tablet, Rfl: 1   nystatin cream (MYCOSTATIN), Apply 1 application topically 2 (two) times daily. (Patient taking differently: Apply 1 application  topically daily as needed (vaginal yeast).), Disp: 30 g, Rfl: 2   pantoprazole (PROTONIX) 40 MG tablet, Take 1 tablet (40 mg total) by mouth 2 (two) times daily., Disp: 90 tablet, Rfl: 1   pentosan polysulfate (ELMIRON) 100 MG capsule, Take 1 capsule (100 mg total) by mouth 3 (three) times daily. Reported on 01/05/2016 (Patient taking differently: Take 100 mg by mouth daily as needed (burning  and pain). Reported on 01/05/2016), Disp: 90 capsule, Rfl: 11   polyethylene glycol (MIRALAX) 17 g packet, Take 17 g by mouth 2 (two) times daily., Disp: 14 each, Rfl: 0   REPATHA SURECLICK 140 MG/ML SOAJ, INJECT 140 MG INTO THE SKIN EVERY 14 DAYS, Disp: 6 mL, Rfl: 0   sucralfate (CARAFATE) 1 GM/10ML suspension, Take 10 mLs (1 g total) by mouth 4 (four) times daily -  with meals and at bedtime. (Patient taking differently: Take 1 g by mouth 2 (two) times daily.), Disp: 420 mL, Rfl: 1   traMADol (ULTRAM) 50 MG tablet, Take 25 mg by mouth 2 (two) times daily., Disp: , Rfl:    traZODone (DESYREL) 100 MG tablet, TAKE  1 TABLET BY MOUTH AT BEDTIME, Disp: 90 tablet, Rfl: 0   Vitamin D, Ergocalciferol, (DRISDOL) 1.25 MG (50000 UNIT) CAPS capsule, Take 1 capsule (50,000 Units total) by mouth every 7 (seven) days. 12x Weeks, Disp: 5 capsule, Rfl: 3  Clinical Interview:   The following information was obtained during a clinical interview with Ms. Mathias and her husband prior to cognitive testing.  Cognitive Symptoms: Decreased short-term memory: Endorsed. She described prominent memory loss impacting essentially all aspects of her life, noting that she "cannot remember anything." Specific examples surrounded trouble with navigation, trouble recalling details of recent conversations, and trouble recalling names. Difficulties were said to have appeared more pronounced during the past couple of years.  Decreased long-term memory: Endorsed. She emphasized that she is generally unable to recall aspects of her childhood or early life, noting that what she does recall is due to others telling her stories.  Decreased attention/concentration: Endorsed. She reported ongoing trouble with sustained attention and increased distractibility.  Reduced processing speed: Endorsed. Difficulties with executive functions: Endorsed. She reported trouble with organization, multi-tasking, and decision making ("I don't do any of that  anymore"). Personality wise, her husband described her as having somewhat of a pessimistic view of the world but he did not describe any significant personality changes or concerns surrounding disinhibition.  Difficulties with emotion regulation: Denied. Difficulties with receptive language: Denied. Difficulties with word finding: Endorsed. Decreased visuoperceptual ability: Denied.  Difficulties completing ADLs: Denied. She did report trouble remembering if she has taken her medications in the evenings from time to time.   Additional Medical History: History of traumatic brain injury/concussion: She reported suffering from multiple mild head traumas throughout her life. At age 60 she fell off the back of a truck, hit her head, and lost consciousness for a few seconds. In 2006, she fell down 14 steps and again hit her head and lost consciousness for approximately 5 or 6 minutes. No persisting difficulties from these events were reported. Around 2012, she reportedly fell again and hit her head but no loss of consciousness was reported. In July 2017, she again fell and hit her head. No loss of consciousness was reported but she was dazed and confused. Most recently, she reported falling and striking her head on the ground about 3 years prior. She was unsure surrounding a loss in consciousness but did describe feeling dazed and "saw stars" for a brief period of time afterwards.   History of stroke: Denied. History of seizure activity: Denied. History of known exposure to toxins: Denied. Symptoms of chronic pain: Endorsed. She has a history of fibromyalgia and medical records suggest complaints of pain surrounding numerous body regions. Pain was said to be impactful in a daily basis and contribute to mood concerns.  Experience of frequent headaches/migraines: Endorsed. Frequent instances of dizziness/vertigo: Endorsed.  Sensory changes: Denied.  Balance/coordination difficulties: Endorsed. As stated  above, she has experienced several falls over the years. She noted commonly tripping over her feet and attributed some balance instability to the presence of peripheral neuropathy. She has been using a cane to assist with ambulation. She did admit that her physical therapist and other clinicians have advised her to use a walker to ambulate. She did not wish to use this type of device currently.  Other motor difficulties: Endorsed. She reported primary action/intention tremors (e.g., trying to thread a needle) and stated that a previous clinician described this experience as essential tremor.  Sleep History: Estimated hours obtained each night: Unclear.  Difficulties  falling asleep: Variably so. Difficulties staying asleep: Variably so. Feels rested and refreshed upon awakening: Unclear. She reported commonly experiencing significant fatigue and exhaustion throughout the day.   History of snoring: Endorsed. History of waking up gasping for air: Endorsed. Witnessed breath cessation while asleep: Endorsed. Medical records suggest a history of obstructive sleep apnea and COPD overlay.   History of vivid dreaming: Endorsed. Excessive movement while asleep: Endorsed. Instances of acting out her dreams: Endorsed. She described the presence of distressing, vivid nightmares where she will hit, kick, fight, and otherwise act out dream content. This was said to have emerged during the past 6-8 months and can happen somewhat often. The cause was unknown.   Psychiatric/Behavioral Health History: Depression: There is a lengthy history of depression, at least dating back to the unexpected passing of her first husband. She noted working with an individual therapist around that time but did not find it overly helpful. She described her current mood as "not good," noting that she can be moved to tears very easily. Current medications do not seem overly helpful in managing symptoms and she denied any current therapy  involvement. Current or remote suicidal ideation, intent, or plan was denied.  Anxiety: There is a lengthy history of generalized anxious distress. This was said to be quite apparent lately. She acknowledged that "little things" can make her very anxious. Her husband also suspected recent elevations in overall anxiety levels.  Mania: Denied. Trauma History: Denied. Visual/auditory hallucinations: Unclear. Over the past 6-8 months, she also reported some instances where she will think she sees someone in an empty room. The cause for this was unknown.  Delusional thoughts: Denied.  Tobacco: Denied. Alcohol: She denied current alcohol consumption as well as a history of problematic alcohol abuse or dependence.  Recreational drugs: Denied.  Family History: Problem Relation Age of Onset   Rectal cancer Mother    Colon cancer Mother    Pancreatic cancer Mother    Diabetes Mother    Breast cancer Mother 85   Breast cancer Maternal Aunt        breast   Birth defects Maternal Aunt    Irritable bowel syndrome Son    Heart disease Son        CAD, MV replacement   Allergic Disorder Daughter    Diabetes Daughter    Colon cancer Father    Colon polyps Father    Diabetes Father    Stroke Father    Heart disease Father        CHF   Hyperlipidemia Father    Hypertension Father    Arthritis Father    Breast cancer Paternal Aunt    Breast cancer Paternal Aunt    Arthritis Paternal Uncle    Allergic Disorder Daughter    Heart disease Cousin        CAD, had a blood clot after stenting   Esophageal cancer Neg Hx    Stomach cancer Neg Hx    This information was confirmed by Ms. Aro.  Academic/Vocational History: Highest level of educational attainment: 16 years. She reportedly graduated high school of the valedictorian of her class and earned a Energy manager degree in nursing. She described herself as a Designer, multimedia in academic settings.  History of developmental delay: Denied. History of  grade repetition: Denied. Enrollment in special education courses: Denied. History of LD/ADHD: Denied.  Employment: Retired. She previously worked as an Charity fundraiser.   Evaluation Results:   Behavioral Observations: [name] was accompanied by  her husband, arrived to her appointment on time, and was appropriately dressed and groomed. She appeared alert and oriented. Observed gait and station were within normal limits. Gross motor functioning appeared intact upon informal observation and no abnormal movements (e.g., tremors) were noted. Her affect was noted to be highly reactive with outwardly observable anxiety. She also was brought close to tears several times when describing subjective day-to-day dysfunction. Spontaneous speech was fluent and word finding difficulties were not observed during the clinical interview. Thought processes were coherent, organized, and normal in content. Insight into her cognitive difficulties appeared adequate.   During testing, outward anxiety was noted to be very high throughout the testing process. For example, behavioral observations were noteworthy for [name] standing upwards, pacing, running her hands through her hair, and exhibiting heavy breathing during task administration. This was prevalent from the initial task, a bare-bones cognitive screening instrument (SLUMS), and persisted throughout much of the testing session. Anxiety levels did appear to mildly diminish as the evaluation progressed. Sustained attention was appropriate. Task engagement was adequate and she persisted when challenged. Overall, [name] was cooperative with the clinical interview and subsequent testing procedures.   Adequacy of Effort: The validity of neuropsychological testing is limited by the extent to which the individual being tested may be assumed to have exerted adequate effort during testing. Ms. Kinderman expressed her intention to perform to the best of her abilities and exhibited  adequate task engagement and persistence. Scores across stand-alone and embedded performance validity measures were within expectation. As such, the results of the current evaluation are believed to be a valid representation of Ms. Kestler's current cognitive functioning.  Test Results: Ms. Pruss was fully oriented at the time of the current evaluation.  Intellectual abilities based upon educational and vocational attainment were estimated to be in the average range. Premorbid abilities were estimated to be within the average range based upon a single-word reading test.   Processing speed was below average to average. Basic attention was above average. More complex attention (e.g., working memory) was average. Executive functioning was largely below average to average. She did exhibit an isolated well below average performance across a rapid sequencing task (TMT B). This may have been impacted by anxiety as it represented one of the first tasks administered across the evaluation. She performed in the well above average range across a task assessing safety and judgment.   Assessed receptive language abilities were above average. Likewise, Ms. Portugal did not exhibit any difficulties comprehending task instructions and answered all questions asked of her appropriately. Assessed expressive language was variable. Phonemic fluency was exceptionally low to well below average, semantic fluency was well below average to below average, and confrontation naming was average to above average.      Assessed visuospatial/visuoconstructional abilities were below average to average outside of trouble drawing a clock. When asked to draw the face of the clock, numbers were placed outside of the outer circle (but placement and spatial arrangement was appropriate). She exhibited prominent confusion and was unable to make an attempt at placing the clock hands.   Learning (i.e., encoding) of novel verbal information was  average. Spontaneous delayed recall (i.e., retrieval) of previously learned information was variable but overall appropriate, ranging from the below average to well above average normative ranges. Retention rates were appropriate across all memory tasks. Performance across recognition tasks was also appropriate, suggesting evidence for information consolidation.   Results of emotional screening instruments suggested that recent symptoms of generalized anxiety  were in the moderate range, while symptoms of depression were also within the moderate range. A screening instrument assessing recent sleep quality suggested the presence of mild sleep dysfunction.  Tables of Scores:   Note: This summary of test scores accompanies the interpretive report and should not be considered in isolation without reference to the appropriate sections in the text. Descriptors are based on appropriate normative data and may be adjusted based on clinical judgment. Terms such as "Within Normal Limits" and "Outside Normal Limits" are used when a more specific description of the test score cannot be determined.       Percentile - Normative Descriptor > 98 - Exceptionally High 91-97 - Well Above Average 75-90 - Above Average 25-74 - Average 9-24 - Below Average 2-8 - Well Below Average < 2 - Exceptionally Low       Validity:   DESCRIPTOR       DCT: --- --- Within Normal Limits  RBANS EI: --- --- Within Normal Limits  WAIS-IV RDS: --- --- Within Normal Limits       Orientation:      Raw Score Percentile   NAB Orientation, Form 1 29/29 --- ---       Cognitive Screening:      Raw Score Percentile   SLUMS: 23/30 --- ---       RBANS, Form A: Standard Score/ Scaled Score Percentile   Total Score 92 30 Average  Immediate Memory 100 50 Average    List Learning 9 37 Average    Story Memory 11 63 Average  Visuospatial/Constructional 89 23 Below Average    Figure Copy 7 16 Below Average    Line Orientation 17/20  51-75 Average  Language 85 16 Below Average    Picture Naming 10/10 51-75 Average    Semantic Fluency 4 2 Well Below Average  Attention 97 42 Average    Digit Span 12 75 Above Average    Coding 7 16 Below Average  Delayed Memory 103 58 Average    List Recall 6/10 51-75 Average    List Recognition 19/20 26-50 Average    Story Recall 15 95 Well Above Average    Story Recognition 12/12 69+ Average    Figure Recall 7 16 Below Average    Figure Recognition 4/8 9-20 Below Average        Intellectual Functioning:      Standard Score Percentile   Test of Premorbid Functioning: 101 53 Average       Attention/Executive Function:     Trail Making Test (TMT): Raw Score (T Score) Percentile     Part A 44 secs.,  0 errors (45) 31 Average    Part B 195 secs.,  3 errors (34) 5 Well Below Average         Scaled Score Percentile   WAIS-IV Digit Span: 10 50 Average    Forward 12 75 Above Average    Backward 9 37 Average    Sequencing 9 37 Average        Scaled Score Percentile   WAIS-IV Similarities: 10 50 Average       D-KEFS Color-Word Interference Test: Raw Score (Scaled Score) Percentile     Color Naming 41 secs. (7) 16 Below Average    Word Reading 27 secs. (9) 37 Average    Inhibition 97 secs. (6) 9 Below Average      Total Errors 0 errors (13) 84 Above Average    Inhibition/Switching 97 secs. (8) 25 Average  Total Errors 2 errors (11) 63 Average       D-KEFS Verbal Fluency Test: Raw Score (Scaled Score) Percentile     Letter Total Correct 18 (5) 5 Well Below Average    Category Total Correct 24 (6) 9 Below Average    Category Switching Total Correct 9 (6) 9 Below Average    Category Switching Accuracy 8 (7) 16 Below Average      Total Set Loss Errors 0 (13) 84 Above Average      Total Repetition Errors 0 (13) 84 Above Average       NAB Executive Functions Module, Form 1: T Score Percentile     Judgment 66 95 Well Above Average       Language:     Verbal Fluency Test:  Raw Score (T Score) Percentile     Phonemic Fluency (FAS) 18 (29) 2 Exceptionally Low    Animal Fluency 11 (30) 2 Well Below Average        NAB Language Module, Form 1: T Score Percentile     Auditory Comprehension 57 75 Above Average    Naming 30/31 (59) 82 Above Average       Visuospatial/Visuoconstruction:      Raw Score Percentile   Clock Drawing: 4/10 --- Impaired        Scaled Score Percentile   WAIS-IV Block Design: 9 37 Average       Mood and Personality:      Raw Score Percentile   Beck Depression Inventory - II: 24 --- Moderate  PROMIS Anxiety Questionnaire: 25 --- Moderate       Additional Questionnaires:      Raw Score Percentile   PROMIS Sleep Disturbance Questionnaire: 29 --- Mild   Informed Consent and Coding/Compliance:   The current evaluation represents a clinical evaluation for the purposes previously outlined by the referral source and is in no way reflective of a forensic evaluation.   Ms. Gehret was provided with a verbal description of the nature and purpose of the present neuropsychological evaluation. Also reviewed were the foreseeable risks and/or discomforts and benefits of the procedure, limits of confidentiality, and mandatory reporting requirements of this provider. The patient was given the opportunity to ask questions and receive answers about the evaluation. Oral consent to participate was provided by the patient.   This evaluation was conducted by Newman Nickels, Ph.D., ABPP-CN, board certified clinical neuropsychologist. Ms. Emick completed a clinical interview with Dr. Milbert Coulter, billed as one unit [id number], and 155 minutes of cognitive testing and scoring, billed as one unit [phone] and four additional units [id number]. Psychometrist Wallace Keller, B.S. assisted Dr. Milbert Coulter with test administration and scoring procedures. As a separate and discrete service, one unit [id number] and two units [phone] were billed for Dr. Tammy Sours time spent in interpretation and report  writing.

## 2023-08-21 NOTE — Progress Notes (Signed)
   Psychometrician Note   Cognitive testing was administered to [name] by Wallace Keller, B.S. (psychometrist) under the supervision of Dr. Newman Nickels, Ph.D., licensed psychologist on 08/21/2023. Ms. Hegg did not appear overtly distressed by the testing session per behavioral observation or responses across self-report questionnaires. Rest breaks were offered.    The battery of tests administered was selected by Dr. Newman Nickels, Ph.D. with consideration to Ms. Gurka's current level of functioning, the nature of her symptoms, emotional and behavioral responses during interview, level of literacy, observed level of motivation/effort, and the nature of the referral question. This battery was communicated to the psychometrist. Communication between Dr. Newman Nickels, Ph.D. and the psychometrist was ongoing throughout the evaluation and Dr. Newman Nickels, Ph.D. was immediately accessible at all times. Dr. Newman Nickels, Ph.D. provided supervision to the psychometrist on the date of this service to the extent necessary to assure the quality of all services provided.    EANNA KULIG will return within approximately 1-2 weeks for an interactive feedback session with Dr. Milbert Coulter at which time her test performances, clinical impressions, and treatment recommendations will be reviewed in detail. Ms. Blanks understands she can contact our office should she require our assistance before this time.  A total of 155 minutes of billable time were spent face-to-face with Ms. Winfield by the psychometrist. This includes both test administration and scoring time. Billing for these services is reflected in the clinical report generated by Dr. Newman Nickels, Ph.D.  This note reflects time spent with the psychometrician and does not include test scores or any clinical interpretations made by Dr. Milbert Coulter. The full report will follow in a separate note.

## 2023-08-22 ENCOUNTER — Encounter: Payer: Self-pay | Admitting: Psychology

## 2023-08-25 ENCOUNTER — Other Ambulatory Visit: Payer: Self-pay | Admitting: Family Medicine

## 2023-08-26 ENCOUNTER — Other Ambulatory Visit: Payer: Self-pay | Admitting: Family Medicine

## 2023-08-27 NOTE — Assessment & Plan Note (Signed)
Tolerating Repatha °

## 2023-08-27 NOTE — Assessment & Plan Note (Signed)
Hydrate and monitor 

## 2023-08-27 NOTE — Assessment & Plan Note (Signed)
Encouraged to get adequate exercise, calcium and vitamin d intake 

## 2023-08-27 NOTE — Assessment & Plan Note (Addendum)
Does not tolerate statins. Encourage heart healthy diet such as MIND or DASH diet, increase exercise, avoid trans fats, simple carbohydrates and processed foods, consider a krill or fish or flaxseed oil cap daily.

## 2023-08-27 NOTE — Assessment & Plan Note (Signed)
Well controlled, no changes to meds. Encouraged heart healthy diet such as the DASH diet and exercise as tolerated.  °

## 2023-08-27 NOTE — Assessment & Plan Note (Signed)
Doing well using [place] prn.

## 2023-08-27 NOTE — Assessment & Plan Note (Signed)
Supplement and monitor 

## 2023-08-27 NOTE — Assessment & Plan Note (Signed)
No recent exacerbation. No changes 

## 2023-08-27 NOTE — Assessment & Plan Note (Signed)
monitor

## 2023-08-28 ENCOUNTER — Ambulatory Visit (INDEPENDENT_AMBULATORY_CARE_PROVIDER_SITE_OTHER): Payer: Medicare Other | Admitting: Family Medicine

## 2023-08-28 VITALS — BP 122/70 | HR 82 | Temp 97.5°F | Resp 16 | Ht 65.0 in | Wt 123.2 lb

## 2023-08-28 DIAGNOSIS — N3010 Interstitial cystitis (chronic) without hematuria: Secondary | ICD-10-CM | POA: Diagnosis not present

## 2023-08-28 DIAGNOSIS — M810 Age-related osteoporosis without current pathological fracture: Secondary | ICD-10-CM

## 2023-08-28 DIAGNOSIS — E538 Deficiency of other specified B group vitamins: Secondary | ICD-10-CM

## 2023-08-28 DIAGNOSIS — E785 Hyperlipidemia, unspecified: Secondary | ICD-10-CM

## 2023-08-28 DIAGNOSIS — R296 Repeated falls: Secondary | ICD-10-CM

## 2023-08-28 DIAGNOSIS — M791 Myalgia, unspecified site: Secondary | ICD-10-CM | POA: Diagnosis not present

## 2023-08-28 DIAGNOSIS — J4489 Other specified chronic obstructive pulmonary disease: Secondary | ICD-10-CM

## 2023-08-28 DIAGNOSIS — R42 Dizziness and giddiness: Secondary | ICD-10-CM

## 2023-08-28 DIAGNOSIS — R251 Tremor, unspecified: Secondary | ICD-10-CM

## 2023-08-28 DIAGNOSIS — R2681 Unsteadiness on feet: Secondary | ICD-10-CM

## 2023-08-28 DIAGNOSIS — G8929 Other chronic pain: Secondary | ICD-10-CM

## 2023-08-28 DIAGNOSIS — I10 Essential (primary) hypertension: Secondary | ICD-10-CM | POA: Diagnosis not present

## 2023-08-28 DIAGNOSIS — R7989 Other specified abnormal findings of blood chemistry: Secondary | ICD-10-CM

## 2023-08-28 DIAGNOSIS — M2550 Pain in unspecified joint: Secondary | ICD-10-CM | POA: Diagnosis not present

## 2023-08-28 DIAGNOSIS — R519 Headache, unspecified: Secondary | ICD-10-CM

## 2023-08-28 DIAGNOSIS — N183 Chronic kidney disease, stage 3 unspecified: Secondary | ICD-10-CM

## 2023-08-28 DIAGNOSIS — F418 Other specified anxiety disorders: Secondary | ICD-10-CM

## 2023-08-28 DIAGNOSIS — Z789 Other specified health status: Secondary | ICD-10-CM

## 2023-08-28 DIAGNOSIS — F331 Major depressive disorder, recurrent, moderate: Secondary | ICD-10-CM

## 2023-08-28 DIAGNOSIS — F419 Anxiety disorder, unspecified: Secondary | ICD-10-CM

## 2023-08-28 LAB — SEDIMENTATION RATE: Sed Rate: 9 mm/h (ref 0–30)

## 2023-08-28 LAB — LIPID PANEL
Cholesterol: 131 mg/dL (ref 0–200)
HDL: 39.4 mg/dL (ref >39.00)
LDL Cholesterol: 67 mg/dL (ref 0–99)
NonHDL: 91.32
Total CHOL/HDL Ratio: 3
Triglycerides: 122 mg/dL (ref 0.0–149.0)
VLDL: 24.4 mg/dL (ref 0.0–40.0)

## 2023-08-28 LAB — CBC WITH DIFFERENTIAL/PLATELET
Basophils Absolute: 0.1 10*3/uL (ref 0.0–0.1)
Basophils Relative: 1.1 % (ref 0.0–3.0)
Eosinophils Absolute: 0.2 10*3/uL (ref 0.0–0.7)
Eosinophils Relative: 2.7 % (ref 0.0–5.0)
HCT: 38.4 % (ref 36.0–46.0)
Hemoglobin: 11.9 g/dL — ABNORMAL LOW (ref 12.0–15.0)
Lymphocytes Relative: 15.4 % (ref 12.0–46.0)
Lymphs Abs: 1.2 10*3/uL (ref 0.7–4.0)
MCHC: 31 g/dL (ref 30.0–36.0)
MCV: 87.9 fl (ref 78.0–100.0)
Monocytes Absolute: 0.7 10*3/uL (ref 0.1–1.0)
Monocytes Relative: 8.7 % (ref 3.0–12.0)
Neutro Abs: 5.8 10*3/uL (ref 1.4–7.7)
Neutrophils Relative %: 72.1 % (ref 43.0–77.0)
Platelets: 363 10*3/uL (ref 150.0–400.0)
RBC: 4.37 Mil/uL (ref 3.87–5.11)
RDW: 17.7 % — ABNORMAL HIGH (ref 11.5–15.5)
WBC: 8.1 10*3/uL (ref 4.0–10.5)

## 2023-08-28 LAB — COMPREHENSIVE METABOLIC PANEL
ALT: 8 U/L (ref 0–35)
AST: 15 U/L (ref 0–37)
Albumin: 3.6 g/dL (ref 3.5–5.2)
Alkaline Phosphatase: 118 U/L — ABNORMAL HIGH (ref 39–117)
BUN: 13 mg/dL (ref 6–23)
CO2: 32 meq/L (ref 19–32)
Calcium: 9.2 mg/dL (ref 8.4–10.5)
Chloride: 101 meq/L (ref 96–112)
Creatinine, Ser: 1.11 mg/dL (ref 0.40–1.20)
GFR: 47.73 mL/min — ABNORMAL LOW (ref >60.00)
Glucose, Bld: 83 mg/dL (ref 70–99)
Potassium: 4.7 meq/L (ref 3.5–5.1)
Sodium: 140 meq/L (ref 135–145)
Total Bilirubin: 0.5 mg/dL (ref 0.2–1.2)
Total Protein: 6.2 g/dL (ref 6.0–8.3)

## 2023-08-28 LAB — HIGH SENSITIVITY CRP: CRP, High Sensitivity: 14.58 mg/L — ABNORMAL HIGH (ref 0.000–5.000)

## 2023-08-28 LAB — CK: Total CK: 21 U/L (ref 7–177)

## 2023-08-28 MED ORDER — HYDROXYZINE HCL 10 MG PO TABS: 10.0000 mg | ORAL_TABLET | Freq: Two times a day (BID) | ORAL | 2 refills | Status: DC | PRN

## 2023-08-28 MED ORDER — FLUTICASONE PROPIONATE HFA 110 MCG/ACT IN AERO: 2.0000 | INHALATION_SPRAY | Freq: Two times a day (BID) | RESPIRATORY_TRACT | 5 refills | Status: DC

## 2023-08-28 NOTE — Patient Instructions (Addendum)
Netflix The Blue Zones about aging well  Hydrate 60-80 ounces of water daily  Protein every 4 hours roughly

## 2023-08-29 ENCOUNTER — Encounter: Payer: Self-pay | Admitting: Family Medicine

## 2023-08-29 ENCOUNTER — Ambulatory Visit: Payer: Medicare Other | Admitting: Psychology

## 2023-08-29 DIAGNOSIS — M797 Fibromyalgia: Secondary | ICD-10-CM

## 2023-08-29 DIAGNOSIS — F411 Generalized anxiety disorder: Secondary | ICD-10-CM | POA: Diagnosis not present

## 2023-08-29 DIAGNOSIS — F09 Unspecified mental disorder due to known physiological condition: Secondary | ICD-10-CM | POA: Diagnosis not present

## 2023-08-29 DIAGNOSIS — F331 Major depressive disorder, recurrent, moderate: Secondary | ICD-10-CM

## 2023-08-29 LAB — VITAMIN D 25 HYDROXY (VIT D DEFICIENCY, FRACTURES): VITD: 41.22 ng/mL (ref 30.00–100.00)

## 2023-08-29 LAB — TSH: TSH: 2.17 u[IU]/mL (ref 0.35–5.50)

## 2023-08-29 LAB — VITAMIN B12: Vitamin B-12: 256 pg/mL (ref 211–911)

## 2023-08-29 NOTE — Assessment & Plan Note (Signed)
Has been on Duloxetine 60 mg po bid for quite some time but symptoms are worsening. She has not tried the Atarax 10 mg she was previously prescribed for her anxious episodes. She will try this as needed and she is referred to psychiatry for further consideration

## 2023-08-29 NOTE — Assessment & Plan Note (Signed)
Worsening tremors and instability of gait. Consider progressive movement disorder referred to neurology for further consideration

## 2023-08-29 NOTE — Addendum Note (Signed)
Addended by: Jules Husbands MARIE L on: 08/29/2023 12:41 PM   Modules accepted: Orders

## 2023-08-29 NOTE — Progress Notes (Signed)
Subjective:    Patient ID: [name], female    DOB: 07-Mar-1946, 77 y.o.   MRN: [id number]  Chief Complaint  Patient presents with   Follow-up    Follow up    HPI Discussed the use of AI scribe software for clinical note transcription with the patient, who gave verbal consent to proceed.  History of Present Illness   The patient, with a complex medical history, presents with multiple complaints. The primary concern is memory issues, for which they have seen Marcelline Deist, NP and Dr. Milbert Coulter. The patient reports feeling overwhelmed and anxious, which has led to social isolation. They also report muscle pain, particularly in the shoulders and under the breasts, which is exacerbated by touch. The patient has been experiencing chronic headaches for the past eight to nine months, which are associated with sore spots on the head.  The patient also reports dizziness, which has led to falls and is currently undergoing physical therapy for pelvic floor issues. They describe the dizziness as worsening, occurring at least once a day and triggered by minor movements.  The patient has a sore spot on their tongue, which has been evaluated by two dentists who recommended seeing an oral surgeon.  The patient also reports experiencing sweats and feeling feverish, with no specific pattern or time of day. They have lost weight due to poor eating habits, but have started taking iron supplements and eating breakfast regularly.  The patient has a history of constipation, but reports bowel movements every four to five days. They have stopped taking Linzess due to stomach discomfort. The patient also reports muscle pain, particularly in the shoulders and under the breasts, which is exacerbated by touch.        Past Medical History:  Diagnosis Date   Abdominal pain 10/26/2013   Allergic rhinitis 01/25/2016   ANA positive 07/29/2013   Anemia    Arthralgia 08/18/2013   Arthritis    Asthma, allergic 08/05/2012    Atrial flutter    past history- not current   Atypical chest pain 01/01/2015   Bilateral carpal tunnel syndrome 03/04/2015   CAD in native artery 08/25/2010   Minor CAD 2009, 2011, low risk Myoview 2013 and 2016        Cataract    bilaterally removed    Cellulitis of breast 02/05/2023   Chronic constipation    Chronic cough 01/09/2017   Chronic interstitial cystitis 02/01/2017   Chronic night sweats 01/08/2015   Common bile duct dilatation 02/13/2023   Concussion    x 3   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   DDD (degenerative disc disease), cervical 09/17/2018   Degenerative scoliosis 04/22/2018   Deviated septum 05/12/2016   Dysphagia    Dyspnea    Dysuria 03/09/2021   Elevated alkaline phosphatase level 01/25/2023   Essential hypertension 08/25/2010   External hemorrhoids 02/05/2015   Facial trauma 01/22/2023   Family history of adverse reaction to anesthesia    Family history of malignant hyperthermia    father had this   Fibrocystic breast disease 10/18/2010   Fibromyalgia    Frequency of urination    Generalized anxiety disorder 08/03/2010   GERD (gastroesophageal reflux disease)    Headache 10/13/2013   High serum vitamin D 05/18/2021   History of chronic bronchitis    History of colonic polyp 02/25/2019   History of hiatal hernia    History of tachycardia    CONTROLLED  WITH ATENOLOL   Hyperglycemia 03/08/2021   Hyperkalemia  09/28/2022   Hyperlipidemia    Intertrigo 12/13/2022   Kidney stone 02/26/2019   Knee pain, right 04/30/2015   Left hip pain 04/30/2015   Leg pain 02/24/2011   Qualifier: Diagnosis of   By: Laury Axon DOMyrene Buddy         Leukocytosis 07/05/2022   Local reaction to hymenoptera sting 05/09/2023   Low back pain 09/08/2020   Major depressive disorder 02/05/2013   Malignant neoplasm of lower-outer quadrant of right breast of female, estrogen receptor positive 01/03/2013   Nasal turbinate hypertrophy 05/12/2016   Oral herpes 02/05/2023   OSA and  COPD overlap syndrome 06/10/2020   Osteoporosis 01/2019   T score -2.2 stable/improved from prior study   Pelvic pain    Peripheral neuropathy 05/22/2014   Personal history of radiation therapy    Pneumonia    Primary insomnia 06/25/2017   Pulmonary nodules 02/12/2015   Rash and other nonspecific skin eruption 05/09/2023   Recurrent falls 05/02/2020   Recurrent sinusitis 02/25/2019   Right arm pain 07/20/2016   RLS (restless legs syndrome) 11/09/2019   RUQ pain 07/05/2022   S/P radiation therapy 11/12/12 - 12/05/12   right Breast   Sciatica 04/30/2015   Sepsis 2014   from UTI    Sjogren's disease 12/12/2021   Splenic lesion 09/02/2012   Sprain of lateral ligament of ankle joint 07/31/2023   Stage 3 chronic kidney disease    Statin intolerance 02/29/2020   Stricture and stenosis of esophagus 10/19/2011   Tongue lesion 02/05/2023   Tremor 07/05/2022   Urgency of urination    Vertigo 01/22/2023   Vitamin B12 deficiency 03/08/2021   Vocal cord cyst 05/18/2021    Past Surgical History:  Procedure Laterality Date   24 HOUR PH STUDY N/A 04/03/2016   Procedure: 24 HOUR PH STUDY;  Surgeon: Ruffin Frederick, MD;  Location: [facility];  Service: Gastroenterology;  Laterality: N/A;   BREAST BIOPSY Right 08/23/2012   ADH   BREAST BIOPSY Right 10/11/2012   Ductal Carcinoma   BREAST EXCISIONAL BIOPSY Right 09/04/2013   benign   BREAST LUMPECTOMY Right 10/11/2012   W/ [facility] BX   CARDIAC CATHETERIZATION  09-13-2007  DR Riley Kill   WELL-PRESERVED LVF/  DIFFUSE SCATTERED CORONARY CALCIFACATION AND ATHEROSCLEROSIS WITHOUT OBSTRUCTION   CARDIAC CATHETERIZATION  08-04-2010  DR MCALHANY   NON-OBSTRUCTIVE CAD/  pLAD 40%/  oLAD 30%/  mLAD 30%/  pRCA 30%/  EF 60%   CARDIOVASCULAR STRESS TEST  06-18-2012  DR McALHANY   LOW RISK NUCLEAR STUDY/  SMALL FIXED AREA OF MODERATELY DECREASED UPTAKE IN ANTEROSEPTAL WALL WHICH MAY BE ARTIFACTUAL/  NO ISCHEMIA/  EF 68%   COLONOSCOPY  09/29/2010    CYSTOSCOPY     CYSTOSCOPY WITH HYDRODISTENSION AND BIOPSY N/A 03/06/2014   Procedure: CYSTOSCOPY/HYDRODISTENSION/ INSTILATION OF MARCAINE AND PYRIDIUM;  Surgeon: Kathi Ludwig, MD;  Location: [facility];  Service: Urology;  Laterality: N/A;   CYSTOSCOPY/URETEROSCOPY/HOLMIUM LASER/STENT PLACEMENT Left 03/21/2023   Procedure: CYSTOSCOPY LEFT RETROGRADE PYELOGRAM, LEFT URETEROSCOPY, HOLMIUM LASER LITHOTRIPSYM, AND LEFT URETERAL STENT PLACEMENT;  Surgeon: Rene Paci, MD;  Location: [facility];  Service: Urology;  Laterality: Left;  60 MINUTES   ESOPHAGEAL MANOMETRY N/A 04/03/2016   Procedure: ESOPHAGEAL MANOMETRY (EM);  Surgeon: Ruffin Frederick, MD;  Location: [facility];  Service: Gastroenterology;  Laterality: N/A;   EXTRACORPOREAL SHOCK WAVE LITHOTRIPSY Left 02/06/2019   Procedure: EXTRACORPOREAL SHOCK WAVE LITHOTRIPSY (ESWL);  Surgeon: Ihor Gully, MD;  Location: [facility];  Service:  Urology;  Laterality: Left;   NASAL SINUS SURGERY  1985   ORIF RIGHT ANKLE  FX  2006   POLYPECTOMY     REMOVAL VOCAL CORD CYST  01/2013   RIGHT BREAST BX  08/23/2012   RIGHT HAND SURGERY  X3  LAST ONE 2009   INCLUDES  ORIF RIGHT 5TH FINGER AND REVISION TWICE   SKIN CANCER EXCISION     face,arms   TONSILLECTOMY AND ADENOIDECTOMY  AGE 67   TOTAL ABDOMINAL HYSTERECTOMY W/ BILATERAL SALPINGOOPHORECTOMY  1982   W/  APPENDECTOMY   TRANSTHORACIC ECHOCARDIOGRAM  06/24/2012   GRADE I DIASTOLIC DYSFUNCTION/  EF 55-60%/  MILD MR   UPPER GASTROINTESTINAL ENDOSCOPY      Family History  Problem Relation Age of Onset   Rectal cancer Mother    Colon cancer Mother    Pancreatic cancer Mother    Diabetes Mother    Breast cancer Mother 45   Breast cancer Maternal Aunt        breast   Birth defects Maternal Aunt    Irritable bowel syndrome Son    Heart disease Son        CAD, MV replacement   Allergic Disorder Daughter    Diabetes Daughter    Colon cancer Father    Colon  polyps Father    Diabetes Father    Stroke Father    Heart disease Father        CHF   Hyperlipidemia Father    Hypertension Father    Arthritis Father    Breast cancer Paternal Aunt    Breast cancer Paternal Aunt    Arthritis Paternal Uncle    Allergic Disorder Daughter    Heart disease Cousin        CAD, had a blood clot after stenting   Esophageal cancer Neg Hx    Stomach cancer Neg Hx     Social History   Socioeconomic History   Marital status: Married    Spouse name: Windy Fast    Number of children: 3   Years of education: 16   Highest education level: Bachelor's degree (e.g., BA, AB, BS)  Occupational History   Occupation: Retired    Comment: Charity fundraiser  Tobacco Use   Smoking status: Former    Current packs/day: 0.00    Average packs/day: 1 pack/day for 15.0 years (15.0 ttl pk-yrs)    Types: Cigarettes    Start date: 05/27/1990    Quit date: 05/27/2005    Years since quitting: 18.2   Smokeless tobacco: Never  Vaping Use   Vaping status: Never Used  Substance and Sexual Activity   Alcohol use: No    Alcohol/week: 0.0 standard drinks of alcohol   Drug use: No   Sexual activity: Not Currently    Partners: Male    Birth control/protection: Surgical    Comment: 1st intercourse 77 yo-Fewer than 5 partners, hysterectomy  Other Topics Concern   Not on file  Social History Narrative   Lives with husband.   Caffeine use: 1/2 cup per day   Exercise-- 2days a week [employer],  Water aerobics, walking       Retired Engineer, civil (consulting)   No dietary restrictions, tries to maintain a heart healthy diet   Social Determinants of Health   Financial Resource Strain: Low Risk  (05/01/2023)   Overall Financial Resource Strain (CARDIA)    Difficulty of Paying Living Expenses: Not hard at all  Food Insecurity: No Food Insecurity (05/01/2023)   Hunger Vital  Sign    Worried About Programme researcher, broadcasting/film/video in the Last Year: Never true    Ran Out of Food in the Last Year: Never true  Transportation Needs: No  Transportation Needs (05/01/2023)   PRAPARE - Administrator, Civil Service (Medical): No    Lack of Transportation (Non-Medical): No  Physical Activity: Insufficiently Active (05/01/2023)   Exercise Vital Sign    Days of Exercise per Week: 1 day    Minutes of Exercise per Session: 30 min  Stress: Stress Concern Present (05/01/2023)   [employer] of Occupational Health - Occupational Stress Questionnaire    Feeling of Stress : To some extent  Social Connections: Unknown (05/01/2023)   Social Connection and Isolation Panel [NHANES]    Frequency of Communication with Friends and Family: More than three times a week    Frequency of Social Gatherings with Friends and Family: Patient declined    Attends Religious Services: Not on Insurance claims handler of Clubs or Organizations: Yes    Attends Banker Meetings: More than 4 times per year    Marital Status: Married  Catering manager Violence: Not At Risk (03/05/2023)   Humiliation, Afraid, Rape, and Kick questionnaire    Fear of Current or Ex-Partner: No    Emotionally Abused: No    Physically Abused: No    Sexually Abused: No    Outpatient Medications Prior to Visit  Medication Sig Dispense Refill   acetaminophen (TYLENOL) 500 MG tablet Take 1,000 mg by mouth every 6 (six) hours as needed for mild pain or headache.      albuterol (VENTOLIN HFA) 108 (90 Base) MCG/ACT inhaler Inhale 2 puffs into the lungs every 4 (four) hours as needed for wheezing or shortness of breath (coughing fits). 18 g 1   aspirin EC 81 MG tablet Take 1 tablet (81 mg total) by mouth daily. 90 tablet 3   atenolol (TENORMIN) 25 MG tablet Take 1 tablet (25 mg total) by mouth 2 (two) times daily. Take 1 tablet by mouth 2 time a day 180 tablet 3   CALCIUM PO Take 500 mg by mouth daily.     carboxymethylcellul-glycerin (OPTIVE) 0.5-0.9 % ophthalmic solution Place 1 drop into both eyes daily as needed for dry eyes.     Cholecalciferol (VITAMIN D3) 50  MCG (2000 UT) CAPS Take 2,000 Units by mouth daily.     cyanocobalamin (VITAMIN B12) 1000 MCG tablet Take 1,000 mg by mouth daily with breakfast.     diclofenac Sodium (VOLTAREN) 1 % GEL Apply 2 g topically daily as needed (Back pain).     Docusate Sodium (DSS) 100 MG CAPS Take 100 mg by mouth daily as needed (Constipation).     DULoxetine (CYMBALTA) 60 MG capsule Take 1 capsule by mouth twice daily 180 capsule 0   EPINEPHrine 0.3 mg/0.3 mL IJ SOAJ injection Inject 0.3 mg into the muscle as needed for anaphylaxis. 2 each 1   famotidine (PEPCID) 20 MG tablet Take 1 tablet (20 mg total) by mouth 2 (two) times daily as needed for heartburn or indigestion. 60 tablet 1   fluticasone (FLONASE) 50 MCG/ACT nasal spray Use 2 spray(s) in each nostril once daily (Patient taking differently: Place 2 sprays into both nostrils daily as needed for allergies or rhinitis.) 16 g 5   folic acid (FOLVITE) 1 MG tablet Take 1 tablet (1 mg total) by mouth daily.     furosemide (LASIX) 20 MG tablet Take  1 tablet (20 mg total) by mouth as needed for edema. 90 tablet 3   gabapentin (NEURONTIN) 300 MG capsule Take 300-900 mg by mouth See admin instructions. Take 600 mg in the morning 300 mg at lunch time and 900 mg at bedtime     loratadine (CLARITIN) 10 MG tablet Take 10 mg by mouth daily as needed for rhinitis.     nitroGLYCERIN (NITROSTAT) 0.4 MG SL tablet Place 1 tablet (0.4 mg total) under the tongue every 5 (five) minutes as needed for chest pain. 25 tablet 1   nystatin cream (MYCOSTATIN) Apply 1 application topically 2 (two) times daily. (Patient taking differently: Apply 1 application  topically daily as needed (vaginal yeast).) 30 g 2   pentosan polysulfate (ELMIRON) 100 MG capsule Take 1 capsule (100 mg total) by mouth 3 (three) times daily. Reported on 01/05/2016 (Patient taking differently: Take 100 mg by mouth daily as needed (burning and pain). Reported on 01/05/2016) 90 capsule 11   polyethylene glycol (MIRALAX) 17  g packet Take 17 g by mouth 2 (two) times daily. 14 each 0   REPATHA SURECLICK 140 MG/ML SOAJ INJECT 140 MG INTO THE SKIN EVERY 14 DAYS 6 mL 0   sucralfate (CARAFATE) 1 GM/10ML suspension Take 10 mLs (1 g total) by mouth 4 (four) times daily -  with meals and at bedtime. (Patient taking differently: Take 1 g by mouth 2 (two) times daily.) 420 mL 1   traMADol (ULTRAM) 50 MG tablet Take 25 mg by mouth 2 (two) times daily.     traZODone (DESYREL) 100 MG tablet TAKE 1 TABLET BY MOUTH AT BEDTIME 90 tablet 0   Vitamin D, Ergocalciferol, (DRISDOL) 1.25 MG (50000 UNIT) CAPS capsule Take 1 capsule (50,000 Units total) by mouth every 7 (seven) days. 12x Weeks 5 capsule 3   amLODipine (NORVASC) 5 MG tablet Take 1 tablet (5 mg total) by mouth daily. (Patient not taking: Reported on 08/28/2023) 90 tablet 3   diphenhydrAMINE (BENADRYL) 25 mg capsule Take 25 mg by mouth every 6 (six) hours as needed for itching.     fluticasone (FLOVENT HFA) 110 MCG/ACT inhaler Inhale 2 puffs into the lungs 2 (two) times daily. (Patient taking differently: Inhale 2 puffs into the lungs daily as needed (Short of breath).) 1 each 5   fluticasone-salmeterol (ADVAIR) 250-50 MCG/ACT AEPB Inhale 1 puff into the lungs in the morning and at bedtime. 1 each 2   hydrOXYzine (ATARAX) 10 MG tablet Take 1 tablet (10 mg total) by mouth 3 (three) times daily as needed. 30 tablet 1   linaclotide (LINZESS) 145 MCG CAPS capsule Take 1 capsule (145 mcg total) by mouth daily as needed. Lot: [id number]  Exp:11-2023 12 capsule 0   Multiple Vitamins-Minerals (MULTIVITAMIN WITH MINERALS) tablet Take 1 tablet by mouth daily. (Patient taking differently: Take 1 tablet by mouth daily. 50+)     pantoprazole (PROTONIX) 40 MG tablet Take 1 tablet (40 mg total) by mouth 2 (two) times daily. 90 tablet 1   No facility-administered medications prior to visit.    Allergies  Allergen Reactions   Clindamycin Hcl Shortness Of Breath and Rash   Penicillins Anaphylaxis    Rosuvastatin Anaphylaxis   Baclofen Other (See Comments) and Rash   Lincomycin Other (See Comments)   Prednisone Rash    Other reaction(s): Rash, Hives - can tolerate if she takes with benadryl    Codeine Hives and Other (See Comments)    headache Other reaction(s): Headache, Nausea, headache  Doxycycline     Skin rash   Erythromycin Hives   Haemophilus Influenzae Other (See Comments)    Local reaction at the site Local reaction at the site   Haemophilus Influenzae Vaccines Other (See Comments)    Local reaction at site   Latex Hives   Levofloxacin     Other reaction(s): sick   Other Other (See Comments)    Father had malignant hyperthermia   Pentazocine Lactate Other (See Comments)    HALLUCINATION   Pneumococcal Vaccine Polyvalent Hives, Swelling and Other (See Comments)    REACTION: redness, swelling, and hives at injection site   Tamoxifen Nausea And Vomiting and Other (See Comments)    HEADACHE Other reaction(s): Headache, Nausea   Fluzone [Influenza Virus Vaccine] Hives    Local reaction at the site    Review of Systems  Constitutional:  Positive for malaise/fatigue. Negative for fever.  HENT:  Negative for congestion.   Eyes:  Negative for blurred vision.  Respiratory:  Negative for shortness of breath.   Cardiovascular:  Negative for chest pain, palpitations and leg swelling.  Gastrointestinal:  Negative for abdominal pain, blood in stool and nausea.  Genitourinary:  Negative for dysuria and frequency.  Musculoskeletal:  Positive for falls.  Skin:  Negative for rash.  Neurological:  Negative for dizziness, loss of consciousness and headaches.  Endo/Heme/Allergies:  Negative for environmental allergies.  Psychiatric/Behavioral:  Positive for depression. Negative for suicidal ideas. The patient is nervous/anxious.        Objective:    Physical Exam  BP 122/70 (BP Location: Left Arm, Patient Position: Sitting, Cuff Size: Normal)   Pulse 82   Temp (!)  97.5 F (36.4 C) (Oral)   Resp 16   Ht 5\' 5"  (1.651 m)   Wt 123 lb 3.2 oz (55.9 kg)   SpO2 95%   BMI 20.50 kg/m  Wt Readings from Last 3 Encounters:  08/28/23 123 lb 3.2 oz (55.9 kg)  08/03/23 123 lb (55.8 kg)  07/24/23 122 lb 3.2 oz (55.4 kg)    Diabetic Foot Exam - Simple   No data filed    Lab Results  Component Value Date   WBC 8.1 08/28/2023   HGB 11.9 (L) 08/28/2023   HCT 38.4 08/28/2023   PLT 363.0 08/28/2023   GLUCOSE 83 08/28/2023   CHOL 131 08/28/2023   TRIG 122.0 08/28/2023   HDL 39.40 08/28/2023   LDLDIRECT 74.0 06/12/2023   LDLCALC 67 08/28/2023   ALT 8 08/28/2023   AST 15 08/28/2023   NA 140 08/28/2023   K 4.7 08/28/2023   CL 101 08/28/2023   CREATININE 1.11 08/28/2023   BUN 13 08/28/2023   CO2 32 08/28/2023   TSH 2.17 08/28/2023   INR 1.05 08/03/2010   HGBA1C 5.5 05/28/2023   MICROALBUR <0.7 08/08/2023    Lab Results  Component Value Date   TSH 2.17 08/28/2023   Lab Results  Component Value Date   WBC 8.1 08/28/2023   HGB 11.9 (L) 08/28/2023   HCT 38.4 08/28/2023   MCV 87.9 08/28/2023   PLT 363.0 08/28/2023   Lab Results  Component Value Date   NA 140 08/28/2023   K 4.7 08/28/2023   CHLORIDE 102 04/20/2015   CO2 32 08/28/2023   GLUCOSE 83 08/28/2023   BUN 13 08/28/2023   CREATININE 1.11 08/28/2023   BILITOT 0.5 08/28/2023   ALKPHOS 118 (H) 08/28/2023   AST 15 08/28/2023   ALT 8 08/28/2023   PROT 6.2 08/28/2023  ALBUMIN 3.6 08/28/2023   CALCIUM 9.2 08/28/2023   ANIONGAP 10 06/14/2023   EGFR 55 (L) 05/25/2023   GFR 47.73 (L) 08/28/2023   Lab Results  Component Value Date   CHOL 131 08/28/2023   Lab Results  Component Value Date   HDL 39.40 08/28/2023   Lab Results  Component Value Date   LDLCALC 67 08/28/2023   Lab Results  Component Value Date   TRIG 122.0 08/28/2023   Lab Results  Component Value Date   CHOLHDL 3 08/28/2023   Lab Results  Component Value Date   HGBA1C 5.5 05/28/2023       Assessment  & Plan:  Chronic interstitial cystitis Assessment & Plan: Doing well using Elmiron prn.   COPD with asthma Assessment & Plan: No recent exacerbation. No changes   Essential hypertension Assessment & Plan: Well controlled, no changes to meds. Encouraged heart healthy diet such as the DASH diet and exercise as tolerated.    Orders: -     CBC with Differential/Platelet -     Comprehensive metabolic panel -     TSH  High serum vitamin D Assessment & Plan: monitor  Orders: -     VITAMIN D 25 Hydroxy (Vit-D Deficiency, Fractures)  Hyperlipidemia, unspecified hyperlipidemia type Assessment & Plan: Does not tolerate statins. Encourage heart healthy diet such as MIND or DASH diet, increase exercise, avoid trans fats, simple carbohydrates and processed foods, consider a krill or fish or flaxseed oil cap daily.    Orders: -     Lipid panel  Osteoporosis, unspecified osteoporosis type, unspecified pathological fracture presence Assessment & Plan: Encouraged to get adequate exercise, calcium and vitamin d intake    Stage 3 chronic kidney disease, unspecified whether stage 3a or 3b CKD (HCC) Assessment & Plan: Hydrate and monitor    Statin intolerance Assessment & Plan: Tolerating Repatha   Vitamin B12 deficiency Assessment & Plan: Supplement and monitor  Orders: -     Vitamin B12  Unsteady gait -     Ambulatory referral to Neurology  Dizziness -     Ambulatory referral to Neurology  Chronic nonintractable headache, unspecified headache type -     Ambulatory referral to Neurology  Tremor -     Ambulatory referral to Neurology  Myalgia -     CK  Arthralgia, unspecified joint -     Sedimentation rate -     High sensitivity CRP -     Rheumatoid factor -     ANA  Moderate episode of recurrent major depressive disorder (HCC) Assessment & Plan: Has been on Duloxetine 60 mg po bid for quite some time but symptoms are worsening. She has not tried the Atarax  10 mg she was previously prescribed for her anxious episodes. She will try this as needed and she is referred to psychiatry for further consideration  Orders: -     Ambulatory referral to Psychiatry  Anxiety -     Ambulatory referral to Psychiatry  Recurrent falls Assessment & Plan: Worsening tremors and instability of gait. Consider progressive movement disorder referred to neurology for further consideration    Depression with anxiety Assessment & Plan: Has been on Duloxetine 60 mg po bid for quite some time but symptoms are worsening. She has not tried the Atarax 10 mg she was previously prescribed for her anxious episodes. She will try this as needed and she is referred to psychiatry for further consideration   Other orders -     hydrOXYzine  HCl; Take 1 tablet (10 mg total) by mouth 2 (two) times daily as needed for anxiety (not with Loratadine).  Dispense: 60 tablet; Refill: 2 -     Fluticasone Propionate HFA; Inhale 2 puffs into the lungs 2 (two) times daily.  Dispense: 1 each; Refill: 5    Assessment and Plan    Possible Movement Disorder Reports difficulty with gait and frequent falls. Plan for referral to neurologist specializing in movement disorders for definitive diagnosis. -Refer to Dr. Arbutus Leas for evaluation.  Chronic Tension Headache Reports chronic headache for 8-9 months, with tender spots on head. Previous imaging in May was normal. Current use of Tramadol may be contributing to rebound headaches. -Consider physical therapy or massage therapy. -Reduce use of Tramadol if not effective. -Order labs for markers of inflammation and arthritis (CK, CRP, sed rate, rheumatoid factor, ANA).  Anxiety Reports significant anxiety, currently controlling by limiting environment. Currently on Duloxetine and Atarax (Hydroxyzine) for anxiety, but may need adjustment. -Continue Duloxetine and Atarax. -Consider referral to psychiatry for medication adjustment.  Muscle  Pain Reports widespread muscle pain and tenderness, particularly in arms and under breasts. Currently on Gabapentin for pain. -Continue Gabapentin (2 in the morning, 2 at lunch, 3 at night). -Consider referral to physical therapy.  Constipation Reports bowel movements every 4-5 days. Currently on Miralax. -Continue Miralax daily. -Consider additional dose of Miralax if no bowel movement by day 3.  GERD Reports daily use of Pepcid. -Continue Pepcid as needed.  Insomnia Reports long-term use of Trazodone for sleep. -Continue Trazodone at bedtime.  Possible Oral Lesion Reports spot on tongue, with referral to oral surgeon. -Follow up after consultation with oral surgeon.  Hypertension Reports discontinuation of Amlodipine and Lasix due to low blood pressure. -Continue off Amlodipine and Lasix, monitor blood pressure.  Asthma/COPD Reports daily use of Flovent and Albuterol as needed. -Continue Flovent (2 puffs in the morning, 1 puff at night) and Albuterol as needed. -Discontinue Advair.  Allergies Reports daily use of Claritin. -Continue Claritin daily. -Use Atarax as needed for anxiety, but not with Claritin.  General Health Maintenance -Encourage hydration and regular protein intake. -Check basic lab work, cholesterol, metabolic panel, anemia, thyroid. -Consider regular appointments every 6-8 weeks for ongoing management.         Danise Edge, MD

## 2023-08-29 NOTE — Progress Notes (Signed)
   Neuropsychology Feedback Session Eligha Bridegroom. [facility] [place] Department of Neurology  Reason for Referral:   [name] is a 77 y.o. right-handed Caucasian female referred by Marlowe Kays, PA-C, to characterize her current cognitive functioning and assist with diagnostic clarity and treatment planning in the context of subjective cognitive decline.   Feedback:   [name] completed a comprehensive neuropsychological evaluation on 08/21/2023. Please refer to that encounter for the full report and recommendations. Briefly, results suggested a relative weakness surrounding verbal fluency (both phonemic and semantic). She also exhibited very slight performance variability across both executive functioning and visuospatial abilities. However, said variability amounted to a single low score across each domain coupled with numerous intact performances, suggesting that this degree of variability is quite minimal and these domains are largely within normal limits. Performances were appropriate relative to age-matched peers across processing speed, attention/concentration, receptive language, confrontation naming, and all aspects of learning and memory. Responses across mood-related questionnaires suggested acute levels of moderate depression and anxiety within the past 1-2 weeks, as well as mild sleep dysfunction. This mirrors her report during interview, as well as behavioral observations throughout testing. Prominent psychiatric distress can absolutely have a negative impact across cognitive functioning, both across a testing environment, as well as in her day-to-day life. Deficits with attention/concentration, executive functioning, verbal fluency, and encoding/retrieval aspects of memory are very commonly reported. [name] further described a history of fibromyalgia/chronic pain and significant fatigue. She is also on a very large number of medications (if the current medication list in  her chart is accurate), increasing the likelihood for a polypharmacy component to her current presentation. Given current evidence, the combination of these factors would appear to be the likely explanation for day-to-day subjective impairment.   [name] was accompanied by her husband during the current feedback session. Content of the current session focused on the results of her neuropsychological evaluation. [name] was given the opportunity to ask questions and her questions were answered. She was encouraged to reach out should additional questions arise. A copy of her report was provided at the conclusion of the visit.      One unit [phone] was billed for Dr. Tammy Sours time spent preparing for, conducting, and documenting the current feedback session with [name].

## 2023-08-30 DIAGNOSIS — K121 Other forms of stomatitis: Secondary | ICD-10-CM | POA: Diagnosis not present

## 2023-08-31 LAB — ANA: Anti Nuclear Antibody (ANA): POSITIVE — AB

## 2023-08-31 LAB — ANTI-NUCLEAR AB-TITER (ANA TITER): ANA Titer 1: 1:40 {titer} — ABNORMAL HIGH

## 2023-08-31 LAB — RHEUMATOID FACTOR: Rheumatoid fact SerPl-aCnc: <10 [IU]/mL (ref <14)

## 2023-09-03 ENCOUNTER — Telehealth: Payer: Self-pay

## 2023-09-03 ENCOUNTER — Other Ambulatory Visit: Payer: Self-pay | Admitting: Family Medicine

## 2023-09-03 ENCOUNTER — Telehealth: Payer: Self-pay | Admitting: Family Medicine

## 2023-09-03 DIAGNOSIS — M2550 Pain in unspecified joint: Secondary | ICD-10-CM

## 2023-09-03 DIAGNOSIS — R768 Other specified abnormal immunological findings in serum: Secondary | ICD-10-CM

## 2023-09-03 DIAGNOSIS — M791 Myalgia, unspecified site: Secondary | ICD-10-CM

## 2023-09-03 DIAGNOSIS — M3500 Sicca syndrome, unspecified: Secondary | ICD-10-CM

## 2023-09-03 NOTE — Telephone Encounter (Signed)
Called pt and she stated would like the  rheumatology referral.

## 2023-09-03 NOTE — Telephone Encounter (Signed)
Called pt back was advised will send message to Dr. Abner Greenspan ok with  rheumatology referral

## 2023-09-03 NOTE — Telephone Encounter (Signed)
Pt said that she received a message from [facility] and he told her to call back or send mychart message. Pt unable to get into her mychart and would like a call back. Pt did not provide additional info for me to relay to CMA.

## 2023-09-04 ENCOUNTER — Other Ambulatory Visit (INDEPENDENT_AMBULATORY_CARE_PROVIDER_SITE_OTHER): Payer: Medicare Other

## 2023-09-04 ENCOUNTER — Encounter: Payer: Self-pay | Admitting: Physician Assistant

## 2023-09-04 ENCOUNTER — Ambulatory Visit: Payer: Medicare Other | Admitting: Physician Assistant

## 2023-09-04 ENCOUNTER — Encounter: Payer: Self-pay | Admitting: Professional

## 2023-09-04 ENCOUNTER — Ambulatory Visit (INDEPENDENT_AMBULATORY_CARE_PROVIDER_SITE_OTHER): Payer: Medicare Other | Admitting: Professional

## 2023-09-04 VITALS — BP 144/74 | HR 99 | Resp 18 | Ht 65.0 in | Wt 124.0 lb

## 2023-09-04 DIAGNOSIS — F331 Major depressive disorder, recurrent, moderate: Secondary | ICD-10-CM

## 2023-09-04 DIAGNOSIS — G629 Polyneuropathy, unspecified: Secondary | ICD-10-CM

## 2023-09-04 DIAGNOSIS — R2681 Unsteadiness on feet: Secondary | ICD-10-CM

## 2023-09-04 DIAGNOSIS — R790 Abnormal level of blood mineral: Secondary | ICD-10-CM | POA: Diagnosis not present

## 2023-09-04 DIAGNOSIS — R42 Dizziness and giddiness: Secondary | ICD-10-CM | POA: Diagnosis not present

## 2023-09-04 DIAGNOSIS — F411 Generalized anxiety disorder: Secondary | ICD-10-CM | POA: Diagnosis not present

## 2023-09-04 DIAGNOSIS — F419 Anxiety disorder, unspecified: Secondary | ICD-10-CM

## 2023-09-04 DIAGNOSIS — F32A Depression, unspecified: Secondary | ICD-10-CM

## 2023-09-04 NOTE — Progress Notes (Signed)
° ° ° ° ° ° ° ° ° ° ° ° ° ° °  Kathy Collins, LCMHC °

## 2023-09-04 NOTE — Patient Instructions (Addendum)
Plan  referral to psychology Agree with psychiatry depression and anxiety.  Referral to hematology for low ferritin, microcytic anemia,  referral to PT for strength and balance.   EMG NCS for neuropathy .  Recommend  to ENT for vestibular therapy for benign positional vertigo  Check B6 SPEP  Copper  Continue Gabapentin and Cymbalta as directed  Follow up the appt as scheduled

## 2023-09-04 NOTE — Progress Notes (Unsigned)
Neurology clinic note  SERVICE DATE: @DATE @   Reason for Evaluation:    HPI: This is [name], a 78 y.o. ***-handed female with extensive medical history *** who presents to neurology clinic with the chief complaint of gait instability and dizziness. She had recently been seen for emory complaints with normal workup. Her most recent MRI brain on 05/2023 was unremarkable for abnormalities to explain the current symptoms. No strokes were observe (old or new). paresthesias***. The patient is accompanied by ***. Keep on the gabapentin, 600 600 900    The patient has not*** had similar episodes of symptoms in the past. ***  Muscle bulk loss? ***She reports  "there is nothing" Muscle pain? *** Endorsed.  Cramps/Twitching? ***Endorsed, the neuropathy has gone to my knees, worse. Has not had an EMG/NCS in 5-6 y . Has not seen a  specialist in 2 years Does strength improve after brief exercise?*** yes  Able to brush hair/teeth without difficulty? Yes, but my arms get tired  Able to button shirts/use zips? Yes  Clumsiness/dropping grasped objects?sometimes.  Can you arise from squatted position easily? Yes, I am working on that.   Able to get out of chair without using arms? Yes  Able to walk up steps easily? Yes, but because I am SOB Use an assistive device to walk? Uses a cane  Significant imbalance with walking? Yes  Falls?she uses a cane to prevent them. I have not gone to PT for a long time     The patient denies symptoms suggestive of oculobulbar weakness including diplopia, ptosis,  Dysphagia :going to GI next week for GI dilatation    poor saliva control  Y,   dysarthria/dysphonia, impaired mastication, facial weakness/droop. Denies   There are no*** neuromuscular respiratory weakness symptoms, particularly orthopnea>dyspnea. + due to  Iron deficiency     The patient does not*** report symptoms referable to autonomic dysfunction including impaired sweating, heat or cold  intolerance, excessive mucosal dryness, gastroparetic early satiety, postprandial abdominal bloating, constipation, bowel or bladder dyscontrol, erectile dysfunction*** or syncope/presyncope/orthostatic intolerance.  There are no*** complaints relating to other symptoms of small fiber modalities including paresthesia/pain. *** Yes   The patient has not *** noticed any recent skin rashes nor does he*** report any constitutional symptoms like fever, night sweats, anorexia or unintentional weight loss. Recent buccal Herpes falre ****  ETOH use: *** no  Restrictive diet? *** heart diet  Family history of neuropathy/myopathy/NM disease?***  Previous labs, electrodiagnostics, and neuroimaging are summarized below, but pertinent findings include***  Any biopsy done? *** Current medications being tried for the patient's symptoms include ***  Prior medications that have been tried: ***   MEDICATIONS:  Outpatient Encounter Medications as of 09/04/2023  Medication Sig   acetaminophen (TYLENOL) 500 MG tablet Take 1,000 mg by mouth every 6 (six) hours as needed for mild pain or headache.    albuterol (VENTOLIN HFA) 108 (90 Base) MCG/ACT inhaler Inhale 2 puffs into the lungs every 4 (four) hours as needed for wheezing or shortness of breath (coughing fits).   aspirin EC 81 MG tablet Take 1 tablet (81 mg total) by mouth daily.   atenolol (TENORMIN) 25 MG tablet Take 1 tablet (25 mg total) by mouth 2 (two) times daily. Take 1 tablet by mouth 2 time a day   CALCIUM PO Take 500 mg by mouth daily.   carboxymethylcellul-glycerin (OPTIVE) 0.5-0.9 % ophthalmic solution Place 1 drop into both eyes daily as needed for dry eyes.  Cholecalciferol (VITAMIN D3) 50 MCG (2000 UT) CAPS Take 2,000 Units by mouth daily.   cyanocobalamin (VITAMIN B12) 1000 MCG tablet Take 1,000 mg by mouth daily with breakfast.   diclofenac Sodium (VOLTAREN) 1 % GEL Apply 2 g topically daily as needed (Back pain).   Docusate Sodium (DSS)  100 MG CAPS Take 100 mg by mouth daily as needed (Constipation).   DULoxetine (CYMBALTA) 60 MG capsule Take 1 capsule by mouth twice daily   EPINEPHrine 0.3 mg/0.3 mL IJ SOAJ injection Inject 0.3 mg into the muscle as needed for anaphylaxis.   famotidine (PEPCID) 20 MG tablet Take 1 tablet (20 mg total) by mouth 2 (two) times daily as needed for heartburn or indigestion.   fluticasone (FLONASE) 50 MCG/ACT nasal spray Use 2 spray(s) in each nostril once daily (Patient taking differently: Place 2 sprays into both nostrils daily as needed for allergies or rhinitis.)   fluticasone (FLOVENT HFA) 110 MCG/ACT inhaler Inhale 2 puffs into the lungs 2 (two) times daily.   folic acid (FOLVITE) 1 MG tablet Take 1 tablet (1 mg total) by mouth daily.   furosemide (LASIX) 20 MG tablet Take 1 tablet (20 mg total) by mouth as needed for edema.   gabapentin (NEURONTIN) 300 MG capsule Take 300-900 mg by mouth See admin instructions. Take 600 mg in the morning 300 mg at lunch time and 900 mg at bedtime   hydrOXYzine (ATARAX) 10 MG tablet Take 1 tablet (10 mg total) by mouth 2 (two) times daily as needed for anxiety (not with Loratadine).   loratadine (CLARITIN) 10 MG tablet Take 10 mg by mouth daily as needed for rhinitis.   nitroGLYCERIN (NITROSTAT) 0.4 MG SL tablet Place 1 tablet (0.4 mg total) under the tongue every 5 (five) minutes as needed for chest pain.   nystatin cream (MYCOSTATIN) Apply 1 application topically 2 (two) times daily. (Patient taking differently: Apply 1 application  topically daily as needed (vaginal yeast).)   pentosan polysulfate (ELMIRON) 100 MG capsule Take 1 capsule (100 mg total) by mouth 3 (three) times daily. Reported on 01/05/2016 (Patient taking differently: Take 100 mg by mouth daily as needed (burning and pain). Reported on 01/05/2016)   polyethylene glycol (MIRALAX) 17 g packet Take 17 g by mouth 2 (two) times daily.   REPATHA SURECLICK 140 MG/ML SOAJ INJECT 140 MG INTO THE SKIN EVERY  14 DAYS   sucralfate (CARAFATE) 1 GM/10ML suspension Take 10 mLs (1 g total) by mouth 4 (four) times daily -  with meals and at bedtime. (Patient taking differently: Take 1 g by mouth 2 (two) times daily.)   traMADol (ULTRAM) 50 MG tablet Take 25 mg by mouth 2 (two) times daily.   traZODone (DESYREL) 100 MG tablet TAKE 1 TABLET BY MOUTH AT BEDTIME   Vitamin D, Ergocalciferol, (DRISDOL) 1.25 MG (50000 UNIT) CAPS capsule Take 1 capsule (50,000 Units total) by mouth every 7 (seven) days. 12x Weeks   No facility-administered encounter medications on file as of 09/04/2023.    PAST MEDICAL HISTORY: Past Medical History:  Diagnosis Date   Abdominal pain 10/26/2013   Allergic rhinitis 01/25/2016   ANA positive 07/29/2013   Anemia    Arthralgia 08/18/2013   Arthritis    Asthma, allergic 08/05/2012   Atrial flutter    past history- not current   Atypical chest pain 01/01/2015   Bilateral carpal tunnel syndrome 03/04/2015   CAD in native artery 08/25/2010   Minor CAD 2009, 2011, low risk Myoview 2013 and  2016        Cataract    bilaterally removed    Cellulitis of breast 02/05/2023   Chronic constipation    Chronic cough 01/09/2017   Chronic interstitial cystitis 02/01/2017   Chronic night sweats 01/08/2015   Common bile duct dilatation 02/13/2023   Concussion    x 3   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   DDD (degenerative disc disease), cervical 09/17/2018   Degenerative scoliosis 04/22/2018   Deviated septum 05/12/2016   Dysphagia    Dyspnea    Dysuria 03/09/2021   Elevated alkaline phosphatase level 01/25/2023   Essential hypertension 08/25/2010   External hemorrhoids 02/05/2015   Facial trauma 01/22/2023   Family history of adverse reaction to anesthesia    Family history of malignant hyperthermia    father had this   Fibrocystic breast disease 10/18/2010   Fibromyalgia    Frequency of urination    Generalized anxiety disorder 08/03/2010   GERD (gastroesophageal reflux disease)     Headache 10/13/2013   High serum vitamin D 05/18/2021   History of chronic bronchitis    History of colonic polyp 02/25/2019   History of hiatal hernia    History of tachycardia    CONTROLLED  WITH ATENOLOL   Hyperglycemia 03/08/2021   Hyperkalemia 09/28/2022   Hyperlipidemia    Intertrigo 12/13/2022   Kidney stone 02/26/2019   Knee pain, right 04/30/2015   Left hip pain 04/30/2015   Leg pain 02/24/2011   Qualifier: Diagnosis of   By: Laury Axon DOMyrene Buddy         Leukocytosis 07/05/2022   Local reaction to hymenoptera sting 05/09/2023   Low back pain 09/08/2020   Major depressive disorder 02/05/2013   Malignant neoplasm of lower-outer quadrant of right breast of female, estrogen receptor positive 01/03/2013   Nasal turbinate hypertrophy 05/12/2016   Oral herpes 02/05/2023   OSA and COPD overlap syndrome 06/10/2020   Osteoporosis 01/2019   T score -2.2 stable/improved from prior study   Pelvic pain    Peripheral neuropathy 05/22/2014   Personal history of radiation therapy    Pneumonia    Primary insomnia 06/25/2017   Pulmonary nodules 02/12/2015   Rash and other nonspecific skin eruption 05/09/2023   Recurrent falls 05/02/2020   Recurrent sinusitis 02/25/2019   Right arm pain 07/20/2016   RLS (restless legs syndrome) 11/09/2019   RUQ pain 07/05/2022   S/P radiation therapy 11/12/12 - 12/05/12   right Breast   Sciatica 04/30/2015   Sepsis 2014   from UTI    Sjogren's disease 12/12/2021   Splenic lesion 09/02/2012   Sprain of lateral ligament of ankle joint 07/31/2023   Stage 3 chronic kidney disease    Statin intolerance 02/29/2020   Stricture and stenosis of esophagus 10/19/2011   Tongue lesion 02/05/2023   Tremor 07/05/2022   Urgency of urination    Vertigo 01/22/2023   Vitamin B12 deficiency 03/08/2021   Vocal cord cyst 05/18/2021    PAST SURGICAL HISTORY: Past Surgical History:  Procedure Laterality Date   72 HOUR PH STUDY N/A 04/03/2016   Procedure: 24  HOUR PH STUDY;  Surgeon: Ruffin Frederick, MD;  Location: [facility];  Service: Gastroenterology;  Laterality: N/A;   BREAST BIOPSY Right 08/23/2012   ADH   BREAST BIOPSY Right 10/11/2012   Ductal Carcinoma   BREAST EXCISIONAL BIOPSY Right 09/04/2013   benign   BREAST LUMPECTOMY Right 10/11/2012   W/ SLN BX   CARDIAC CATHETERIZATION  09-13-2007  DR Riley Kill  WELL-PRESERVED LVF/  DIFFUSE SCATTERED CORONARY CALCIFACATION AND ATHEROSCLEROSIS WITHOUT OBSTRUCTION   CARDIAC CATHETERIZATION  08-04-2010  DR Clifton James   NON-OBSTRUCTIVE CAD/  pLAD 40%/  oLAD 30%/  mLAD 30%/  pRCA 30%/  EF 60%   CARDIOVASCULAR STRESS TEST  06-18-2012  DR McALHANY   LOW RISK NUCLEAR STUDY/  SMALL FIXED AREA OF MODERATELY DECREASED UPTAKE IN ANTEROSEPTAL WALL WHICH MAY BE ARTIFACTUAL/  NO ISCHEMIA/  EF 68%   COLONOSCOPY  09/29/2010   CYSTOSCOPY     CYSTOSCOPY WITH HYDRODISTENSION AND BIOPSY N/A 03/06/2014   Procedure: CYSTOSCOPY/HYDRODISTENSION/ INSTILATION OF MARCAINE AND PYRIDIUM;  Surgeon: Kathi Ludwig, MD;  Location: [facility];  Service: Urology;  Laterality: N/A;   CYSTOSCOPY/URETEROSCOPY/HOLMIUM LASER/STENT PLACEMENT Left 03/21/2023   Procedure: CYSTOSCOPY LEFT RETROGRADE PYELOGRAM, LEFT URETEROSCOPY, HOLMIUM LASER LITHOTRIPSYM, AND LEFT URETERAL STENT PLACEMENT;  Surgeon: Rene Paci, MD;  Location: [facility];  Service: Urology;  Laterality: Left;  60 MINUTES   ESOPHAGEAL MANOMETRY N/A 04/03/2016   Procedure: ESOPHAGEAL MANOMETRY (EM);  Surgeon: Ruffin Frederick, MD;  Location: [facility];  Service: Gastroenterology;  Laterality: N/A;   EXTRACORPOREAL SHOCK WAVE LITHOTRIPSY Left 02/06/2019   Procedure: EXTRACORPOREAL SHOCK WAVE LITHOTRIPSY (ESWL);  Surgeon: Ihor Gully, MD;  Location: [facility];  Service: Urology;  Laterality: Left;   NASAL SINUS SURGERY  1985   ORIF RIGHT ANKLE  FX  2006   POLYPECTOMY     REMOVAL VOCAL CORD CYST  01/2013   RIGHT BREAST BX   08/23/2012   RIGHT HAND SURGERY  X3  LAST ONE 2009   INCLUDES  ORIF RIGHT 5TH FINGER AND REVISION TWICE   SKIN CANCER EXCISION     face,arms   TONSILLECTOMY AND ADENOIDECTOMY  AGE 56   TOTAL ABDOMINAL HYSTERECTOMY W/ BILATERAL SALPINGOOPHORECTOMY  1982   W/  APPENDECTOMY   TRANSTHORACIC ECHOCARDIOGRAM  06/24/2012   GRADE I DIASTOLIC DYSFUNCTION/  EF 55-60%/  MILD MR   UPPER GASTROINTESTINAL ENDOSCOPY      ALLERGIES: Allergies  Allergen Reactions   Clindamycin Hcl Shortness Of Breath and Rash   Penicillins Anaphylaxis   Rosuvastatin Anaphylaxis   Baclofen Other (See Comments) and Rash   Lincomycin Other (See Comments)   Prednisone Rash    Other reaction(s): Rash, Hives - can tolerate if she takes with benadryl    Codeine Hives and Other (See Comments)    headache Other reaction(s): Headache, Nausea, headache   Doxycycline     Skin rash   Erythromycin Hives   Haemophilus Influenzae Other (See Comments)    Local reaction at the site Local reaction at the site   Haemophilus Influenzae Vaccines Other (See Comments)    Local reaction at site   Latex Hives   Levofloxacin     Other reaction(s): sick   Other Other (See Comments)    Father had malignant hyperthermia   Pentazocine Lactate Other (See Comments)    HALLUCINATION   Pneumococcal Vaccine Polyvalent Hives, Swelling and Other (See Comments)    REACTION: redness, swelling, and hives at injection site   Tamoxifen Nausea And Vomiting and Other (See Comments)    HEADACHE Other reaction(s): Headache, Nausea   Fluzone [Influenza Virus Vaccine] Hives    Local reaction at the site    FAMILY HISTORY: Family History  Problem Relation Age of Onset   Rectal cancer Mother    Colon cancer Mother    Pancreatic cancer Mother    Diabetes Mother    Breast cancer Mother 35  Breast cancer Maternal Aunt        breast   Birth defects Maternal Aunt    Irritable bowel syndrome Son    Heart disease Son        CAD, MV replacement    Allergic Disorder Daughter    Diabetes Daughter    Colon cancer Father    Colon polyps Father    Diabetes Father    Stroke Father    Heart disease Father        CHF   Hyperlipidemia Father    Hypertension Father    Arthritis Father    Breast cancer Paternal Aunt    Breast cancer Paternal Aunt    Arthritis Paternal Uncle    Allergic Disorder Daughter    Heart disease Cousin        CAD, had a blood clot after stenting   Esophageal cancer Neg Hx    Stomach cancer Neg Hx     SOCIAL HISTORY: Social History   Tobacco Use   Smoking status: Former    Current packs/day: 0.00    Average packs/day: 1 pack/day for 15.0 years (15.0 ttl pk-yrs)    Types: Cigarettes    Start date: 05/27/1990    Quit date: 05/27/2005    Years since quitting: 18.2   Smokeless tobacco: Never  Vaping Use   Vaping status: Never Used  Substance Use Topics   Alcohol use: No    Alcohol/week: 0.0 standard drinks of alcohol   Drug use: No   Social History   Social History Narrative   Lives with husband.   Caffeine use: 1/2 cup per day   Exercise-- 2days a week [employer],  Water aerobics, walking       Retired Engineer, civil (consulting)   No dietary restrictions, tries to maintain a heart healthy diet     OBJECTIVE: PHYSICAL EXAM: There were no vitals taken for this visit.  General:*** General appearance: Awake and alert. No distress. Cooperative with exam.  Skin: No obvious rash or jaundice. HEENT: Atraumatic. Anicteric. Lungs: Non-labored breathing on room air  Heart: Regular Abdomen: Soft, non tender. Extremities: No edema. No obvious deformity.  Musculoskeletal: No obvious joint swelling. Psych: Affect appropriate.  Neurological: Mental Status: Alert. Speech fluent. No pseudobulbar affect Cranial Nerves: CNII: No RAPD. Visual fields grossly intact. CNIII, IV, VI: PERRL. No nystagmus. EOMI. CN V: Facial sensation intact bilaterally to fine touch.   CN VII: Facial muscles symmetric and strong. No ptosis  CN  VIII: Hearing grossly intact bilaterally. CN IX: No hypophonia. CN X: Palate elevates symmetrically. CN XI: Full strength shoulder shrug bilaterally. CN XII: Tongue protrusion full and midline. No atrophy or fasciculations. No significant dysarthria*** Motor: Tone is ***. *** No fasciculations in *** extremities. *** No atrophy.     Movement     Neck flexion ***    Neck extension ***     Right Left   Shoulder abduction *** ***   Shoulder adduction *** ***   Shoulder ext rotation *** ***   Shoulder int rotation *** ***   Elbow flexion *** ***   Elbow extension *** ***   Wrist extension *** ***   Wrist flexion *** ***   Finger abduction - FDI *** ***   Finger abduction - ADM *** ***   Finger extension *** ***   Finger distal flexion - 2/3 *** ***   Finger distal flexion - 4/5 *** ***   Thumb flexion - FPL *** ***   Thumb abduction -  APB *** ***    Hip flexion *** ***   Hip extension *** ***   Hip adduction *** ***   Hip abduction *** ***   Knee extension *** ***   Knee flexion *** ***   Dorsiflexion *** ***   Plantarflexion *** ***   Inversion *** ***   Eversion *** ***   Great toe extension *** ***   Great toe flexion *** ***     Reflexes:  Right Left   Bicep *** ***   Tricep *** ***   BrRad *** ***   Knee *** ***   Ankle *** ***    Pathological Reflexes: Babinski: *** response bilaterally***  Sensation: Pinprick: *** Vibration: *** Temperature: *** Proprioception: *** Coordination: Intact finger-to- nose-finger bilaterally. Romberg negative.*** Gait: Able to rise from chair with arms crossed unassisted. Normal, narrow-based gait. Able to tandem walk. Able to walk on toes and heels.***    ASSESSMENT: [name] is a 77 y.o. female who presents for evaluation of ***. *** has a relevant medical history of ***. *** neurological examination is pertinent for ***. Available diagnostic data is significant for ***. This constellation of symptoms and  objective data would most likely localize to ***. ***  PLAN: -Blood work: ***   -Return to clinic *** The patient was counseled on pertinent fall precaution  Total time spent reviewing records, interview, history/exam, documentation, and coordination of care on day of encounter:  *** min     CC: Bradd Canary, MD [address] [place] [address]
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
## 2023-09-10 ENCOUNTER — Ambulatory Visit: Payer: Medicare Other | Attending: Gastroenterology | Admitting: Physical Therapy

## 2023-09-10 ENCOUNTER — Ambulatory Visit: Payer: Medicare Other | Admitting: Professional

## 2023-09-10 ENCOUNTER — Encounter: Payer: Self-pay | Admitting: Physical Therapy

## 2023-09-10 DIAGNOSIS — M545 Low back pain, unspecified: Secondary | ICD-10-CM | POA: Insufficient documentation

## 2023-09-10 DIAGNOSIS — G8929 Other chronic pain: Secondary | ICD-10-CM | POA: Insufficient documentation

## 2023-09-10 DIAGNOSIS — M6281 Muscle weakness (generalized): Secondary | ICD-10-CM | POA: Diagnosis not present

## 2023-09-10 DIAGNOSIS — R1084 Generalized abdominal pain: Secondary | ICD-10-CM | POA: Insufficient documentation

## 2023-09-10 DIAGNOSIS — R252 Cramp and spasm: Secondary | ICD-10-CM | POA: Insufficient documentation

## 2023-09-10 DIAGNOSIS — Z961 Presence of intraocular lens: Secondary | ICD-10-CM | POA: Diagnosis not present

## 2023-09-10 DIAGNOSIS — H43813 Vitreous degeneration, bilateral: Secondary | ICD-10-CM | POA: Diagnosis not present
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
## 2023-09-12 ENCOUNTER — Ambulatory Visit: Payer: Medicare Other | Admitting: Physician Assistant

## 2023-09-12 ENCOUNTER — Inpatient Hospital Stay: Payer: Medicare Other

## 2023-09-12 ENCOUNTER — Ambulatory Visit: Payer: Medicare Other | Admitting: Gastroenterology

## 2023-09-12 ENCOUNTER — Inpatient Hospital Stay: Payer: Medicare Other | Attending: Family | Admitting: Family

## 2023-09-12 ENCOUNTER — Encounter: Payer: Self-pay | Admitting: Family

## 2023-09-12 VITALS — BP 129/72 | HR 71 | Temp 98.5°F | Resp 17 | Ht 63.5 in | Wt 121.1 lb

## 2023-09-12 DIAGNOSIS — D509 Iron deficiency anemia, unspecified: Secondary | ICD-10-CM | POA: Insufficient documentation

## 2023-09-12 DIAGNOSIS — K909 Intestinal malabsorption, unspecified: Secondary | ICD-10-CM | POA: Diagnosis not present

## 2023-09-12 DIAGNOSIS — D508 Other iron deficiency anemias: Secondary | ICD-10-CM

## 2023-09-12 NOTE — Progress Notes (Signed)
Hematology/Oncology Consultation   Name: [name]      MRN: [id number]    Location: Room/bed info not found  Date: 09/12/2023 Time:12:59 PM   REFERRING PHYSICIAN: Marlowe Kays, PA-C  REASON FOR CONSULT:  Low ferritin    DIAGNOSIS: Iron deficiency anemia secondary to malabsorption  HISTORY OF PRESENT ILLNESS: [name] is a very pleasant 77 yo female with long history of intermittent IDA. She states that she first had with pregnancy.  Over the last 1-2 years she has become more symptomatic with the anemia.  She has not responded to oral iron supplements.  Recent iron saturation was 9% and ferritin 8.  No obvious blood loss noted. No abnormal bruising, no petechiae.  She is symptomatic with fatigue, weakness, chills, SOB, dizziness, palpitations, heaviness in her chest, ice cravings ad numbness and tingling in her hands and feet.  She has GERD and states that she is currently taking both protonix as well as pepcid. This may effect her ability to break down and absorb iron from her diet/supplementation.  No diabetes or thyroid disease.  She has history of osteopenia and has received prolia with her gynecologist.  She has history of noninvasive right breast cancer and was followed for 9 years by Dr. Darnelle Catalan. In October 2013 she underwent a right lumpectomy which showed "([id number] ductal carcinoma in situ, low-grade, measuring approximately 3.2 cm, with negative margins (0.15 cm), 100% estrogen receptor positive, 61% progesterone receptor positive". This was followed by radiation completed in December 2013, no chemo. She tried taking Tamoxifen and was not able to tolerate, stopped 03/10/2013.   She has also had several squamous cell carcinoma spots removed.  Her mother also had history of anemia requiring IV iron.  Mother had history of rectal, pancreatic and liver cancer involvement. She had her EGD and colonoscopy in 02/2022. EGD showed a 1 cm hiatal hernia. She also had her esophagus  stretched at that time. Colonoscopy showed internal hemorrhoids and tortuous colon.  No fever, n/v, cough, rash or changes in bowel or bladder habits.  She has history of interstitial cystitis with trouble emptying her bladder and has abdominal soreness after pelvic floor massage. She is followed closely bu [facility].  No swelling in her extremities at this time. She will occasionally note puffiness in her feet and ankles.  She has had 3 falls recently and states she sustained 2 concussions. She notes that these falls were due to dizziness and stumbling.  She is now ambulating with a cane for added support.  No smoking, ETOH or recreational drug use.  Appetite and hydration are good. Weight is 121 lbs.  She is a retired Engineer, civil (consulting).  ROS: All other 10 point review of systems is negative.   PAST MEDICAL HISTORY:   Past Medical History:  Diagnosis Date   Abdominal pain 10/26/2013   Allergic rhinitis 01/25/2016   ANA positive 07/29/2013   Anemia    Arthralgia 08/18/2013   Arthritis    Asthma, allergic 08/05/2012   Atrial flutter    past history- not current   Atypical chest pain 01/01/2015   Bilateral carpal tunnel syndrome 03/04/2015   CAD in native artery 08/25/2010   Minor CAD 2009, 2011, low risk Myoview 2013 and 2016        Cataract    bilaterally removed    Cellulitis of breast 02/05/2023   Chronic constipation    Chronic cough 01/09/2017   Chronic interstitial cystitis 02/01/2017   Chronic night sweats 01/08/2015  Common bile duct dilatation 02/13/2023   Concussion    x 3   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   DDD (degenerative disc disease), cervical 09/17/2018   Degenerative scoliosis 04/22/2018   Deviated septum 05/12/2016   Dysphagia    Dyspnea    Dysuria 03/09/2021   Elevated alkaline phosphatase level 01/25/2023   Essential hypertension 08/25/2010   External hemorrhoids 02/05/2015   Facial trauma 01/22/2023   Family history of adverse reaction to anesthesia     Family history of malignant hyperthermia    father had this   Fibrocystic breast disease 10/18/2010   Fibromyalgia    Frequency of urination    Generalized anxiety disorder 08/03/2010   GERD (gastroesophageal reflux disease)    Headache 10/13/2013   High serum vitamin D 05/18/2021   History of chronic bronchitis    History of colonic polyp 02/25/2019   History of hiatal hernia    History of tachycardia    CONTROLLED  WITH ATENOLOL   Hyperglycemia 03/08/2021   Hyperkalemia 09/28/2022   Hyperlipidemia    Intertrigo 12/13/2022   Kidney stone 02/26/2019   Knee pain, right 04/30/2015   Left hip pain 04/30/2015   Leg pain 02/24/2011   Qualifier: Diagnosis of   By: Laury Axon DOMyrene Buddy         Leukocytosis 07/05/2022   Local reaction to hymenoptera sting 05/09/2023   Low back pain 09/08/2020   Major depressive disorder 02/05/2013   Malignant neoplasm of lower-outer quadrant of right breast of female, estrogen receptor positive 01/03/2013   Nasal turbinate hypertrophy 05/12/2016   Oral herpes 02/05/2023   OSA and COPD overlap syndrome 06/10/2020   Osteoporosis 01/2019   T score -2.2 stable/improved from prior study   Pelvic pain    Peripheral neuropathy 05/22/2014   Personal history of radiation therapy    Pneumonia    Primary insomnia 06/25/2017   Pulmonary nodules 02/12/2015   Rash and other nonspecific skin eruption 05/09/2023   Recurrent falls 05/02/2020   Recurrent sinusitis 02/25/2019   Right arm pain 07/20/2016   RLS (restless legs syndrome) 11/09/2019   RUQ pain 07/05/2022   S/P radiation therapy 11/12/12 - 12/05/12   right Breast   Sciatica 04/30/2015   Sepsis 2014   from UTI    Sjogren's disease 12/12/2021   Splenic lesion 09/02/2012   Sprain of lateral ligament of ankle joint 07/31/2023   Stage 3 chronic kidney disease    Statin intolerance 02/29/2020   Stricture and stenosis of esophagus 10/19/2011   Tongue lesion 02/05/2023   Tremor 07/05/2022   Urgency of  urination    Vertigo 01/22/2023   Vitamin B12 deficiency 03/08/2021   Vocal cord cyst 05/18/2021    ALLERGIES: Allergies  Allergen Reactions   Clindamycin Hcl Shortness Of Breath and Rash   Penicillins Anaphylaxis   Rosuvastatin Anaphylaxis   Baclofen Other (See Comments) and Rash   Lincomycin Other (See Comments)   Prednisone Rash    Other reaction(s): Rash, Hives - can tolerate if she takes with benadryl    Codeine Hives and Other (See Comments)    headache Other reaction(s): Headache, Nausea, headache   Doxycycline     Skin rash   Erythromycin Hives   Haemophilus Influenzae Other (See Comments)    Local reaction at the site Local reaction at the site   Haemophilus Influenzae Vaccines Other (See Comments)    Local reaction at site   Latex Hives   Levofloxacin     Other reaction(s): sick  Other Other (See Comments)    Father had malignant hyperthermia   Pentazocine Lactate Other (See Comments)    HALLUCINATION   Pneumococcal Vaccine Polyvalent Hives, Swelling and Other (See Comments)    REACTION: redness, swelling, and hives at injection site   Tamoxifen Nausea And Vomiting and Other (See Comments)    HEADACHE Other reaction(s): Headache, Nausea   Fluzone [Influenza Virus Vaccine] Hives    Local reaction at the site      MEDICATIONS:  Current Outpatient Medications on File Prior to Visit  Medication Sig Dispense Refill   acetaminophen (TYLENOL) 500 MG tablet Take 1,000 mg by mouth every 6 (six) hours as needed for mild pain or headache.      albuterol (VENTOLIN HFA) 108 (90 Base) MCG/ACT inhaler Inhale 2 puffs into the lungs every 4 (four) hours as needed for wheezing or shortness of breath (coughing fits). 18 g 1   aspirin EC 81 MG tablet Take 1 tablet (81 mg total) by mouth daily. 90 tablet 3   atenolol (TENORMIN) 25 MG tablet Take 1 tablet (25 mg total) by mouth 2 (two) times daily. Take 1 tablet by mouth 2 time a day 180 tablet 3   CALCIUM PO Take 500 mg by  mouth daily.     carboxymethylcellul-glycerin (OPTIVE) 0.5-0.9 % ophthalmic solution Place 1 drop into both eyes daily as needed for dry eyes.     Cholecalciferol (VITAMIN D3) 50 MCG (2000 UT) CAPS Take 2,000 Units by mouth daily.     cyanocobalamin (VITAMIN B12) 1000 MCG tablet Take 1,000 mg by mouth daily with breakfast.     diclofenac Sodium (VOLTAREN) 1 % GEL Apply 2 g topically daily as needed (Back pain).     DULoxetine (CYMBALTA) 60 MG capsule Take 1 capsule by mouth twice daily 180 capsule 0   EPINEPHrine 0.3 mg/0.3 mL IJ SOAJ injection Inject 0.3 mg into the muscle as needed for anaphylaxis. 2 each 1   famotidine (PEPCID) 20 MG tablet Take 1 tablet (20 mg total) by mouth 2 (two) times daily as needed for heartburn or indigestion. 60 tablet 1   fluticasone (FLONASE) 50 MCG/ACT nasal spray Use 2 spray(s) in each nostril once daily (Patient taking differently: Place 2 sprays into both nostrils daily as needed for allergies or rhinitis.) 16 g 5   fluticasone (FLOVENT HFA) 110 MCG/ACT inhaler Inhale 2 puffs into the lungs 2 (two) times daily. 1 each 5   folic acid (FOLVITE) 1 MG tablet Take 1 tablet (1 mg total) by mouth daily.     furosemide (LASIX) 20 MG tablet Take 1 tablet (20 mg total) by mouth as needed for edema. 90 tablet 3   gabapentin (NEURONTIN) 300 MG capsule Take 300-900 mg by mouth See admin instructions. Take 600 mg in the morning 300 mg at lunch time and 900 mg at bedtime     hydrOXYzine (ATARAX) 10 MG tablet Take 1 tablet (10 mg total) by mouth 2 (two) times daily as needed for anxiety (not with Loratadine). 60 tablet 2   loratadine (CLARITIN) 10 MG tablet Take 10 mg by mouth daily as needed for rhinitis.     nitroGLYCERIN (NITROSTAT) 0.4 MG SL tablet Place 1 tablet (0.4 mg total) under the tongue every 5 (five) minutes as needed for chest pain. 25 tablet 1   nystatin cream (MYCOSTATIN) Apply 1 application topically 2 (two) times daily. (Patient taking differently: Apply 1  application  topically daily as needed (vaginal yeast).) 30 g 2  pentosan polysulfate (ELMIRON) 100 MG capsule Take 1 capsule (100 mg total) by mouth 3 (three) times daily. Reported on 01/05/2016 (Patient taking differently: Take 100 mg by mouth daily as needed (burning and pain). Reported on 01/05/2016) 90 capsule 11   polyethylene glycol (MIRALAX) 17 g packet Take 17 g by mouth 2 (two) times daily. 14 each 0   REPATHA SURECLICK 140 MG/ML SOAJ INJECT 140 MG INTO THE SKIN EVERY 14 DAYS 6 mL 0   sucralfate (CARAFATE) 1 GM/10ML suspension Take 10 mLs (1 g total) by mouth 4 (four) times daily -  with meals and at bedtime. (Patient taking differently: Take 1 g by mouth 2 (two) times daily.) 420 mL 1   traMADol (ULTRAM) 50 MG tablet Take 25 mg by mouth 2 (two) times daily.     traZODone (DESYREL) 100 MG tablet TAKE 1 TABLET BY MOUTH AT BEDTIME 90 tablet 0   Vitamin D, Ergocalciferol, (DRISDOL) 1.25 MG (50000 UNIT) CAPS capsule Take 1 capsule (50,000 Units total) by mouth every 7 (seven) days. 12x Weeks 5 capsule 3   Docusate Sodium (DSS) 100 MG CAPS Take 100 mg by mouth daily as needed (Constipation). (Patient not taking: Reported on 09/12/2023)     No current facility-administered medications on file prior to visit.     PAST SURGICAL HISTORY Past Surgical History:  Procedure Laterality Date   87 HOUR PH STUDY N/A 04/03/2016   Procedure: 24 HOUR PH STUDY;  Surgeon: Ruffin Frederick, MD;  Location: [facility];  Service: Gastroenterology;  Laterality: N/A;   BREAST BIOPSY Right 08/23/2012   ADH   BREAST BIOPSY Right 10/11/2012   Ductal Carcinoma   BREAST EXCISIONAL BIOPSY Right 09/04/2013   benign   BREAST LUMPECTOMY Right 10/11/2012   W/ [facility] BX   CARDIAC CATHETERIZATION  09-13-2007  DR Riley Kill   WELL-PRESERVED LVF/  DIFFUSE SCATTERED CORONARY CALCIFACATION AND ATHEROSCLEROSIS WITHOUT OBSTRUCTION   CARDIAC CATHETERIZATION  08-04-2010  DR MCALHANY   NON-OBSTRUCTIVE CAD/  pLAD 40%/  oLAD  30%/  mLAD 30%/  pRCA 30%/  EF 60%   CARDIOVASCULAR STRESS TEST  06-18-2012  DR McALHANY   LOW RISK NUCLEAR STUDY/  SMALL FIXED AREA OF MODERATELY DECREASED UPTAKE IN ANTEROSEPTAL WALL WHICH MAY BE ARTIFACTUAL/  NO ISCHEMIA/  EF 68%   COLONOSCOPY  09/29/2010   CYSTOSCOPY     CYSTOSCOPY WITH HYDRODISTENSION AND BIOPSY N/A 03/06/2014   Procedure: CYSTOSCOPY/HYDRODISTENSION/ INSTILATION OF MARCAINE AND PYRIDIUM;  Surgeon: Kathi Ludwig, MD;  Location: [facility] [place];  Service: Urology;  Laterality: N/A;   CYSTOSCOPY/URETEROSCOPY/HOLMIUM LASER/STENT PLACEMENT Left 03/21/2023   Procedure: CYSTOSCOPY LEFT RETROGRADE PYELOGRAM, LEFT URETEROSCOPY, HOLMIUM LASER LITHOTRIPSYM, AND LEFT URETERAL STENT PLACEMENT;  Surgeon: Rene Paci, MD;  Location: [facility];  Service: Urology;  Laterality: Left;  60 MINUTES   ESOPHAGEAL MANOMETRY N/A 04/03/2016   Procedure: ESOPHAGEAL MANOMETRY (EM);  Surgeon: Ruffin Frederick, MD;  Location: [facility];  Service: Gastroenterology;  Laterality: N/A;   EXTRACORPOREAL SHOCK WAVE LITHOTRIPSY Left 02/06/2019   Procedure: EXTRACORPOREAL SHOCK WAVE LITHOTRIPSY (ESWL);  Surgeon: Ihor Gully, MD;  Location: [facility];  Service: Urology;  Laterality: Left;   NASAL SINUS SURGERY  1985   ORIF RIGHT ANKLE  FX  2006   POLYPECTOMY     REMOVAL VOCAL CORD CYST  01/2013   RIGHT BREAST BX  08/23/2012   RIGHT HAND SURGERY  X3  LAST ONE 2009   INCLUDES  ORIF RIGHT 5TH FINGER AND REVISION TWICE  SKIN CANCER EXCISION     face,arms   TONSILLECTOMY AND ADENOIDECTOMY  AGE 19   TOTAL ABDOMINAL HYSTERECTOMY W/ BILATERAL SALPINGOOPHORECTOMY  1982   W/  APPENDECTOMY   TRANSTHORACIC ECHOCARDIOGRAM  06/24/2012   GRADE I DIASTOLIC DYSFUNCTION/  EF 55-60%/  MILD MR   UPPER GASTROINTESTINAL ENDOSCOPY      FAMILY HISTORY: Family History  Problem Relation Age of Onset   Rectal cancer Mother    Colon cancer Mother    Pancreatic cancer Mother    Diabetes  Mother    Breast cancer Mother 82   Breast cancer Maternal Aunt        breast   Birth defects Maternal Aunt    Irritable bowel syndrome Son    Heart disease Son        CAD, MV replacement   Allergic Disorder Daughter    Diabetes Daughter    Colon cancer Father    Colon polyps Father    Diabetes Father    Stroke Father    Heart disease Father        CHF   Hyperlipidemia Father    Hypertension Father    Arthritis Father    Breast cancer Paternal Aunt    Breast cancer Paternal Aunt    Arthritis Paternal Uncle    Allergic Disorder Daughter    Heart disease Cousin        CAD, had a blood clot after stenting   Esophageal cancer Neg Hx    Stomach cancer Neg Hx     SOCIAL HISTORY:  reports that she quit smoking about 18 years ago. Her smoking use included cigarettes. She started smoking about 33 years ago. She has a 15 pack-year smoking history. She has never used smokeless tobacco. She reports that she does not drink alcohol and does not use drugs.  PERFORMANCE STATUS: The patient's performance status is 1 - Symptomatic but completely ambulatory  PHYSICAL EXAM: Most Recent Vital Signs: Blood pressure 129/72, pulse 71, temperature 98.5 F (36.9 C), temperature source Oral, resp. rate 17, height 5' 3.5" (1.613 m), weight 121 lb 1.3 oz (54.9 kg), SpO2 97%. BP 129/72 (BP Location: Left Arm, Patient Position: Sitting)   Pulse 71   Temp 98.5 F (36.9 C) (Oral)   Resp 17   Ht 5' 3.5" (1.613 m)   Wt 121 lb 1.3 oz (54.9 kg)   SpO2 97%   BMI 21.11 kg/m   General Appearance:    Alert, cooperative, no distress, appears stated age  Head:    Normocephalic, without obvious abnormality, atraumatic  Eyes:    PERRL, conjunctiva/corneas clear, EOM's intact, fundi    benign, both eyes        Throat:   Lips, mucosa, and tongue normal; teeth and gums normal  Neck:   Supple, symmetrical, trachea midline, no adenopathy;    thyroid:  no enlargement/tenderness/nodules; no carotid   bruit or  JVD  Back:     Symmetric, no curvature, ROM normal, no CVA tenderness  Lungs:     Clear to auscultation bilaterally, respirations unlabored  Chest Wall:    No tenderness or deformity   Heart:    Regular rate and rhythm, S1 and S2 normal, no murmur, rub   or gallop     Abdomen:     Soft, non-tender, bowel sounds active all four quadrants,    no masses, no organomegaly        Extremities:   Extremities normal, atraumatic, no cyanosis or edema  Pulses:   2+ and symmetric all extremities  Skin:   Skin color, texture, turgor normal, no rashes or lesions  Lymph nodes:   Cervical, supraclavicular, and axillary nodes normal  Neurologic:   CNII-XII intact, normal strength, sensation and reflexes    throughout    LABORATORY DATA:  No results found. However, due to the size of the patient record, not all encounters were searched. Please check Results Review for a complete set of results.    RADIOGRAPHY: No results found.     PATHOLOGY: None  ASSESSMENT/PLAN: [name] is a very pleasant 77 yo female with long history of intermittent IDA.  We will get her set up for 2 doses of IV iron.  Follow-up in 8 weeks!  All questions were answered. The patient knows to call the clinic with any problems, questions or concerns. We can certainly see the patient much sooner if necessary.    Eileen Stanford, NP

## 2023-09-13 ENCOUNTER — Ambulatory Visit: Payer: Medicare Other

## 2023-09-13 LAB — PROTEIN ELECTROPHORESIS, SERUM
Albumin ELP: 3.5 g/dL — ABNORMAL LOW (ref 3.8–4.8)
Alpha 1: 0.4 g/dL — ABNORMAL HIGH (ref 0.2–0.3)
Alpha 2: 0.7 g/dL (ref 0.5–0.9)
Beta 2: 0.4 g/dL (ref 0.2–0.5)
Beta Globulin: 0.4 g/dL (ref 0.4–0.6)
Gamma Globulin: 0.8 g/dL (ref 0.8–1.7)
Total Protein: 6.2 g/dL (ref 6.1–8.1)

## 2023-09-13 LAB — COPPER, SERUM: Copper: 147 ug/dL (ref 70–175)

## 2023-09-13 LAB — VITAMIN B6: Vitamin B6: 5 ng/mL (ref 2.1–21.7)

## 2023-09-14 ENCOUNTER — Telehealth: Payer: Self-pay | Admitting: Physician Assistant

## 2023-09-14 NOTE — Telephone Encounter (Signed)
Noted thank you

## 2023-09-14 NOTE — Telephone Encounter (Signed)
[facility] called in returning [name]'s call. She stated the pt is scheduled at their sister office [facility] on 09/27/23 with Dr. De Burrs. She stated to call after the appointment to that office to get the notes. Their phone number is [phone]

## 2023-09-17 ENCOUNTER — Ambulatory Visit (INDEPENDENT_AMBULATORY_CARE_PROVIDER_SITE_OTHER): Payer: Medicare Other | Admitting: Pharmacist

## 2023-09-17 ENCOUNTER — Encounter: Payer: Self-pay | Admitting: Professional

## 2023-09-17 ENCOUNTER — Ambulatory Visit: Payer: Medicare Other | Admitting: Professional

## 2023-09-17 DIAGNOSIS — F411 Generalized anxiety disorder: Secondary | ICD-10-CM

## 2023-09-17 DIAGNOSIS — Z789 Other specified health status: Secondary | ICD-10-CM

## 2023-09-17 DIAGNOSIS — F331 Major depressive disorder, recurrent, moderate: Secondary | ICD-10-CM

## 2023-09-17 DIAGNOSIS — E785 Hyperlipidemia, unspecified: Secondary | ICD-10-CM

## 2023-09-17 NOTE — Progress Notes (Signed)
09/17/2023 Name: [name] MRN: [id number] DOB: 1946/08/25  Chief Complaint  Patient presents with   Medication Management   Hyperlipidemia    [name] is a 77 y.o. year old female who presented for a telephone visit.   They were referred to the pharmacist by their Case Management Team  for assistance in managing medication access.    Subjective:  Medication Access/Adherence  Current Pharmacy:  [facility]) [facility] [place], [place] - [address] [place] [address] Phone: [phone] Fax: [phone]  [facility] [address] [place], [place] - [address] [place] [address] Phone: [phone] Fax: [phone]   Patient reports affordability concerns with their medications: Yes  Patient reports access/transportation concerns to their pharmacy: No  Patient reports adherence concerns with their medications:  No     Hyperlipidemia/ASCVD Risk Reduction  Current lipid lowering medications: Repatha 140mg  SQ every 14 days. (LR for 84 DS on 07/17/2023) Medications tried in the past: rosuvastatin - anaphylaxis; atorvastatin - severe joint and muscle pains.   Antiplatelet regimen: aspirin 81mg  daily    Current medication access support: [employer] thru 10/09/2023 She has about $1800 left   Patient also mentioned today that she has had sore throat, congestions and headache since Friday 9/20. She took a COVID test today and was negative.    Objective:  Lab Results  Component Value Date   HGBA1C 5.5 05/28/2023    Lab Results  Component Value Date   CREATININE 1.11 08/28/2023   BUN 13 08/28/2023   NA 140 08/28/2023   K 4.7 08/28/2023   CL 101 08/28/2023   CO2 32 08/28/2023    Lab Results  Component Value Date   CHOL 131 08/28/2023   HDL 39.40 08/28/2023   LDLCALC 67 08/28/2023   LDLDIRECT 74.0 06/12/2023   TRIG 122.0  08/28/2023   CHOLHDL 3 08/28/2023    Medications Reviewed Today     Reviewed by Henrene Pastor, RPH-CPP (Pharmacist) on 09/17/23 at 1515  Med List Status: <None>   Medication Order Taking? Sig Documenting Provider Last Dose Status Informant  acetaminophen (TYLENOL) 500 MG tablet [id number] Yes Take 1,000 mg by mouth every 6 (six) hours as needed for mild pain or headache.  [provider] Taking Active Self  albuterol (VENTOLIN HFA) 108 (90 Base) MCG/ACT inhaler [id number] Yes Inhale 2 puffs into the lungs every 4 (four) hours as needed for wheezing or shortness of breath (coughing fits). Ellamae Sia, DO Taking Active   aspirin EC 81 MG tablet [id number] Yes Take 1 tablet (81 mg total) by mouth daily. Kathleene Hazel, MD Taking Active Self  atenolol (TENORMIN) 25 MG tablet [id number] Yes Take 1 tablet (25 mg total) by mouth 2 (two) times daily. Take 1 tablet by mouth 2 time a day Lewayne Bunting, MD Taking Active   CALCIUM PO [id number] Yes Take 500 mg by mouth daily. [provider] Taking Active Self  carboxymethylcellul-glycerin (OPTIVE) 0.5-0.9 % ophthalmic solution [id number] Yes Place 1 drop into both eyes daily as needed for dry eyes. [provider] Taking Active Self  Cholecalciferol (VITAMIN D3) 50 MCG (2000 UT) CAPS [id number] Yes Take 2,000 Units by mouth daily. [provider] Taking Active Self  cyanocobalamin (VITAMIN B12) 1000 MCG tablet [id number] Yes Take 1,000 mg by mouth daily with breakfast. [provider] Taking Active Self  diclofenac Sodium (VOLTAREN) 1 % GEL [id number] Yes  Apply 2 g topically daily as needed (Back pain). [provider] Taking Active Self  Docusate Sodium (DSS) 100 MG CAPS [id number] Yes Take 100 mg by mouth daily as needed (Constipation). [provider] Taking Active Self  DULoxetine (CYMBALTA) 60 MG capsule [id number] Yes Take 1 capsule by mouth twice daily Bradd Canary, MD Taking Active    EPINEPHrine 0.3 mg/0.3 mL IJ SOAJ injection [id number] Yes Inject 0.3 mg into the muscle as needed for anaphylaxis. Ellamae Sia, DO Taking Active   famotidine (PEPCID) 20 MG tablet [id number] Yes Take 1 tablet (20 mg total) by mouth 2 (two) times daily as needed for heartburn or indigestion. Bradd Canary, MD Taking Active   fluticasone Aleda Grana) 50 MCG/ACT nasal spray [id number] Yes Use 2 spray(s) in each nostril once daily  Patient taking differently: Place 2 sprays into both nostrils daily as needed for allergies or rhinitis.   Bradd Canary, MD Taking Active Self  fluticasone [facility] HFA) 110 MCG/ACT inhaler [id number] Yes Inhale 2 puffs into the lungs 2 (two) times daily. Bradd Canary, MD Taking Active   folic acid (FOLVITE) 1 MG tablet [id number] Yes Take 1 tablet (1 mg total) by mouth daily. Sandford Craze, NP Taking Active   furosemide (LASIX) 20 MG tablet [id number] Yes Take 1 tablet (20 mg total) by mouth as needed for edema. Lewayne Bunting, MD Taking Active Self  gabapentin (NEURONTIN) 300 MG capsule [id number]  Take 300-900 mg by mouth See admin instructions. Take 600 mg in the morning 300 mg at lunch time and 900 mg at bedtime Bradd Canary, MD  Active Self  hydrOXYzine (ATARAX) 10 MG tablet [id number] Yes Take 1 tablet (10 mg total) by mouth 2 (two) times daily as needed for anxiety (not with Loratadine). Bradd Canary, MD Taking Active   loratadine (CLARITIN) 10 MG tablet [id number] Yes Take 10 mg by mouth daily as needed for rhinitis. [provider] Taking Active Self  nitroGLYCERIN (NITROSTAT) 0.4 MG SL tablet [id number] Yes Place 1 tablet (0.4 mg total) under the tongue every 5 (five) minutes as needed for chest pain. Clayborne Dana, NP Taking Active Self           Med Note Dareen Piano [facility]   Wed May 02, 2023 10:31 AM)    nystatin cream (MYCOSTATIN) [id number] Yes Apply 1 application topically 2 (two) times daily.  Patient taking differently: Apply 1 application   topically daily as needed (vaginal yeast).   Bradd Canary, MD Taking Active Self  pentosan polysulfate (ELMIRON) 100 MG capsule [id number] Yes Take 1 capsule (100 mg total) by mouth 3 (three) times daily. Reported on 01/05/2016  Patient taking differently: Take 100 mg by mouth daily as needed (burning and pain). Reported on 01/05/2016   Ok Edwards, MD Taking Active Self  polyethylene glycol (MIRALAX) 17 g packet [id number] Yes Take 17 g by mouth 2 (two) times daily. Benancio Deeds, MD Taking Active   REPATHA SURECLICK 140 MG/ML SOAJ [id number] Yes INJECT 140 MG INTO THE SKIN EVERY 14 DAYS Bradd Canary, MD Taking Active   sucralfate (CARAFATE) 1 GM/10ML suspension [id number] Yes Take 10 mLs (1 g total) by mouth 4 (four) times daily -  with meals and at bedtime. Donato Schultz, DO Taking Active Self  traMADol (ULTRAM) 50 MG tablet [id number] Yes Take 25 mg by mouth 2 (two) times daily. [provider] Taking Active Self  traZODone (DESYREL) 100 MG tablet [id number]  Yes TAKE 1 TABLET BY MOUTH AT BEDTIME Bradd Canary, MD Taking Active   Vitamin D, Ergocalciferol, (DRISDOL) 1.25 MG (50000 UNIT) CAPS capsule [id number] Yes Take 1 capsule (50,000 Units total) by mouth every 7 (seven) days. 12x Marilynn Latino, MD Taking Active Self              Assessment/Plan:   Hyperlipidemia/ASCVD Risk Reduction with history of statin myopathy - Currently controlled.  - Recommend to continue Repatha 140mg  every 14 days. - Patient's [employer] will expire 10/09/2023. Currently they are not re-enrolling for hypercholesterolemia grant. Patient will be able to get 1 more RF before her grant expires 10/15. This should last until January 2025.    Headache / congestion:  - Patient will call office for an appointment tomorrow if symptoms do not improved - Recommended rest, fluids, pain reliver if needed for headache. Continue to use Flovent inhaler and rescue inhaler if  needed.  Follow Up Plan: January 2025 to check on re-enrollment for [facility] for 2025.   Henrene Pastor, PharmD Clinical Pharmacist [facility]

## 2023-09-17 NOTE — Progress Notes (Signed)
[facility] Counselor/Therapist Progress Note  Patient ID: [name], MRN: [id number],    Date: 09/17/2023  Time Spent: 1207-1213pm   Treatment Type: Individual Therapy  Reported Symptoms: pt sick in bed with Covid-like symptoms and unable to participate in therapy today. There will be no charge for this session.  Risk Assessment: Danger to Self:  No Self-injurious Behavior: No Danger to Others: No  Subjective: No session held, patient ill   Diagnosis:Major depressive disorder, recurrent episode, moderate (HCC)  Generalized anxiety disorder  Plan:  -meet on Thursday, September 27, 2023 at [facility] in person.

## 2023-09-17 NOTE — Progress Notes (Signed)
° ° ° ° ° ° ° ° ° ° ° ° ° ° °  Araya Roel, LCMHC °

## 2023-09-19 ENCOUNTER — Encounter: Payer: Self-pay | Admitting: Physical Therapy

## 2023-09-19 ENCOUNTER — Ambulatory Visit: Payer: Medicare Other | Admitting: Physical Therapy

## 2023-09-19 DIAGNOSIS — M6281 Muscle weakness (generalized): Secondary | ICD-10-CM | POA: Diagnosis not present

## 2023-09-19 DIAGNOSIS — G8929 Other chronic pain: Secondary | ICD-10-CM

## 2023-09-19 DIAGNOSIS — M810 Age-related osteoporosis without current pathological fracture: Secondary | ICD-10-CM | POA: Diagnosis not present

## 2023-09-19 DIAGNOSIS — R252 Cramp and spasm: Secondary | ICD-10-CM | POA: Diagnosis not present

## 2023-09-19 DIAGNOSIS — R1084 Generalized abdominal pain: Secondary | ICD-10-CM | POA: Diagnosis not present

## 2023-09-19 DIAGNOSIS — M353 Polymyalgia rheumatica: Secondary | ICD-10-CM | POA: Diagnosis not present

## 2023-09-19 DIAGNOSIS — M1990 Unspecified osteoarthritis, unspecified site: Secondary | ICD-10-CM | POA: Diagnosis not present

## 2023-09-19 DIAGNOSIS — M3501 Sicca syndrome with keratoconjunctivitis: Secondary | ICD-10-CM | POA: Diagnosis not present

## 2023-09-19 DIAGNOSIS — M545 Low back pain, unspecified: Secondary | ICD-10-CM | POA: Diagnosis not present
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
## 2023-09-19 NOTE — Assessment & Plan Note (Signed)
Supplement and monitor 

## 2023-09-19 NOTE — Assessment & Plan Note (Signed)
Does not tolerate statins. Encourage heart healthy diet such as MIND or DASH diet, increase exercise, avoid trans fats, simple carbohydrates and processed foods, consider a krill or fish or flaxseed oil cap daily.

## 2023-09-19 NOTE — Assessment & Plan Note (Signed)
hgba1c acceptable, minimize simple carbs. Increase exercise as tolerated. Continue current meds 

## 2023-09-19 NOTE — Assessment & Plan Note (Signed)
Unable to tolerate statins

## 2023-09-19 NOTE — Assessment & Plan Note (Signed)
Hydrate and monitor 

## 2023-09-19 NOTE — Assessment & Plan Note (Signed)
Struggling with high levels of anxiety

## 2023-09-20 ENCOUNTER — Ambulatory Visit (HOSPITAL_BASED_OUTPATIENT_CLINIC_OR_DEPARTMENT_OTHER)
Admission: RE | Admit: 2023-09-20 | Discharge: 2023-09-20 | Disposition: A | Discharge status: Home / Self Care | Payer: Medicare Other | Source: Ambulatory Visit | Attending: Family Medicine | Admitting: Family Medicine

## 2023-09-20 ENCOUNTER — Ambulatory Visit (INDEPENDENT_AMBULATORY_CARE_PROVIDER_SITE_OTHER): Payer: Medicare Other | Admitting: Family Medicine

## 2023-09-20 VITALS — BP 124/74 | HR 63 | Temp 98.0°F | Resp 16 | Ht 63.0 in | Wt 121.0 lb

## 2023-09-20 DIAGNOSIS — Z789 Other specified health status: Secondary | ICD-10-CM

## 2023-09-20 DIAGNOSIS — R0989 Other specified symptoms and signs involving the circulatory and respiratory systems: Secondary | ICD-10-CM | POA: Diagnosis not present

## 2023-09-20 DIAGNOSIS — R059 Cough, unspecified: Secondary | ICD-10-CM | POA: Diagnosis not present

## 2023-09-20 DIAGNOSIS — R739 Hyperglycemia, unspecified: Secondary | ICD-10-CM | POA: Diagnosis not present

## 2023-09-20 DIAGNOSIS — E538 Deficiency of other specified B group vitamins: Secondary | ICD-10-CM

## 2023-09-20 DIAGNOSIS — N183 Chronic kidney disease, stage 3 unspecified: Secondary | ICD-10-CM

## 2023-09-20 DIAGNOSIS — E785 Hyperlipidemia, unspecified: Secondary | ICD-10-CM

## 2023-09-20 DIAGNOSIS — F418 Other specified anxiety disorders: Secondary | ICD-10-CM

## 2023-09-20 DIAGNOSIS — R918 Other nonspecific abnormal finding of lung field: Secondary | ICD-10-CM | POA: Diagnosis not present

## 2023-09-20 MED ORDER — CEFDINIR 300 MG PO CAPS: 300.0000 mg | ORAL_CAPSULE | Freq: Two times a day (BID) | ORAL | 0 refills | Status: AC

## 2023-09-20 MED ORDER — HYDROXYZINE HCL 10 MG PO TABS: 10.0000 mg | ORAL_TABLET | Freq: Three times a day (TID) | ORAL | 2 refills | Status: DC | PRN

## 2023-09-20 MED ORDER — BENZONATATE 100 MG PO CAPS: 100.0000 mg | ORAL_CAPSULE | Freq: Three times a day (TID) | ORAL | 0 refills | Status: DC | PRN

## 2023-09-20 MED ORDER — METHYLPREDNISOLONE 4 MG PO TABS: ORAL_TABLET | ORAL | 0 refills | Status: DC

## 2023-09-20 NOTE — Patient Instructions (Signed)
021Ac0ute Bronchitis, Adult  Acute bronchitis is sudden inflammation of the main airways (bronchi) that come off the windpipe (trachea) in the lungs. The swelling causes the airways to get smaller and make more mucus than normal. This can make it hard to breathe and can cause coughing or noisy breathing (wheezing). Acute bronchitis may last several weeks. The cough may last longer. Allergies, asthma, and exposure to smoke may make the condition worse. What are the causes? This condition can be caused by germs and by substances that irritate the lungs, including: Cold and flu viruses. The most common cause of this condition is the virus that causes the common cold. Bacteria. This is less common. Breathing in substances that irritate the lungs, including: Smoke from cigarettes and other forms of tobacco. Dust and pollen. Fumes from household cleaning products, gases, or burned fuel. Indoor or outdoor air pollution. What increases the risk? The following factors may make you more likely to develop this condition: A weak body's defense system, also called the immune system. A condition that affects your lungs and breathing, such as asthma. What are the signs or symptoms? Common symptoms of this condition include: Coughing. This may bring up clear, yellow, or green mucus from your lungs (sputum). Wheezing. Runny or stuffy nose. Having too much mucus in your lungs (chest congestion). Shortness of breath. Aches and pains, including sore throat or chest. How is this diagnosed? This condition is usually diagnosed based on: Your symptoms and medical history. A physical exam. You may also have other tests, including tests to rule out other conditions, such as pneumonia. These tests include: A test of lung function. Test of a mucus sample to look for the presence of bacteria. Tests to check the oxygen level in your blood. Blood tests. Chest X-ray. How is this treated? Most cases of acute  bronchitis clear up over time without treatment. Your health care provider may recommend: Drinking more fluids to help thin your mucus so it is easier to cough up. Taking inhaled medicine (inhaler) to improve air flow in and out of your lungs. Using a vaporizer or a humidifier. These are machines that add water to the air to help you breathe better. Taking a medicine that thins mucus and clears congestion (expectorant). Taking a medicine that prevents or stops coughing (cough suppressant). It is not common to take an antibiotic medicine for this condition. Follow these instructions at home:  Take over-the-counter and prescription medicines only as told by your health care provider. Use an inhaler, vaporizer, or humidifier as told by your health care provider. Take two teaspoons (10 mL) of honey at bedtime to lessen coughing at night. Drink enough fluid to keep your urine pale yellow. Do not use any products that contain nicotine or tobacco. These products include cigarettes, chewing tobacco, and vaping devices, such as e-cigarettes. If you need help quitting, ask your health care provider. Get plenty of rest. Return to your normal activities as told by your health care provider. Ask your health care provider what activities are safe for you. Keep all follow-up visits. This is important. How is this prevented? 0To lower your risk of getting this condition again: Wash your hands often with soap and water for at least 20 seconds. If soap and water are not available, use hand sanitizer. Avoid contact with people who have cold symptoms. Try not to touch your mouth, nose, or eyes with your hands. Avoid breathing in smoke or chemical fumes. Breathing smoke or chemical fumes will make  your condition worse. Get the flu shot every year. Contact a health care provider if: Your symptoms do not improve after 2 weeks. You have trouble coughing up the mucus. Your cough keeps you awake at night. You have  a fever. Get help right away if you: Cough up blood. Feel pain in your chest. Have severe shortness of breath. Faint or keep feeling like you are going to faint. Have a severe headache. Have a fever or chills that get worse. These symptoms may represent a serious problem that is an emergency. Do not wait to see if the symptoms will go away. Get medical help right away. Call your local emergency services (911 in the [place] Do not drive yourself to the hospital. Summary Acute bronchitis is inflammation of the main airways (bronchi) that come off the windpipe (trachea) in the lungs. The swelling causes the airways to get smaller and make more mucus than normal. Drinking more fluids can help thin your mucus so it is easier to cough up. Take over-the-counter and prescription medicines only as told by your health care provider. Do not use any products that contain nicotine or tobacco. These products include cigarettes, chewing tobacco, and vaping devices, such as e-cigarettes. If you need help quitting, ask your health care provider. Contact a health care provider if your symptoms do not improve after 2 weeks. This information is not intended to replace advice given to you by your health care provider. Make sure you discuss any questions you have with your health care provider. Document Revised: 03/23/2022 Document Reviewed: 04/13/2021 Elsevier Patient Education  2024 [employer].

## 2023-09-21 ENCOUNTER — Inpatient Hospital Stay: Payer: Medicare Other

## 2023-09-21 ENCOUNTER — Encounter: Payer: Self-pay | Admitting: Family

## 2023-09-21 NOTE — Telephone Encounter (Signed)
Call received from patient stating that she would like to reschedule today's iron infusion d/t she has been diagnosed with bronchitis and is currently on Omnicef.  Message sent to scheduling.

## 2023-09-23 NOTE — Progress Notes (Signed)
Subjective:    Patient ID: [name], female    DOB: Nov 16, 1946, 77 y.o.   MRN: [id number]  Chief Complaint  Patient presents with   Follow-up    Follow up   Cough    Follow up/ cough     HPI Discussed the use of AI scribe software for clinical note transcription with the patient, who gave verbal consent to proceed.  History of Present Illness   The patient, with a known allergy to penicillin, presents with symptoms suggestive of a respiratory infection that started with a scratchy sore throat and runny nose. The symptoms progressed to include chest discomfort, which the patient describes as "chest stuff right here" and radiates to the back. The sore throat has improved but is still present. The patient also reports experiencing fever and chills.  In addition to the respiratory symptoms, the patient has been experiencing itching for several months. The itching is primarily on the head, face, and stomach, and does not extend below the "neuropathy line." The patient has tried changing shampoos three times without relief. The itching is severe enough to disrupt sleep and requires the application of cold cloths for relief.  The patient is due for an iron infusion due to low hemoglobin levels and is concerned about whether the current illness will affect this treatment. The patient also mentions a history of neuropathy, but does not elaborate on this condition in the current conversation.        Past Medical History:  Diagnosis Date   Abdominal pain 10/26/2013   Allergic rhinitis 01/25/2016   ANA positive 07/29/2013   Anemia    Arthralgia 08/18/2013   Arthritis    Asthma, allergic 08/05/2012   Atrial flutter    past history- not current   Atypical chest pain 01/01/2015   Bilateral carpal tunnel syndrome 03/04/2015   CAD in native artery 08/25/2010   Minor CAD 2009, 2011, low risk Myoview 2013 and 2016        Cataract    bilaterally removed    Cellulitis of breast 02/05/2023    Chronic constipation    Chronic cough 01/09/2017   Chronic interstitial cystitis 02/01/2017   Chronic night sweats 01/08/2015   Common bile duct dilatation 02/13/2023   Concussion    x 3   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   DDD (degenerative disc disease), cervical 09/17/2018   Degenerative scoliosis 04/22/2018   Deviated septum 05/12/2016   Dysphagia    Dyspnea    Dysuria 03/09/2021   Elevated alkaline phosphatase level 01/25/2023   Essential hypertension 08/25/2010   External hemorrhoids 02/05/2015   Facial trauma 01/22/2023   Family history of adverse reaction to anesthesia    Family history of malignant hyperthermia    father had this   Fibrocystic breast disease 10/18/2010   Fibromyalgia    Frequency of urination    Generalized anxiety disorder 08/03/2010   GERD (gastroesophageal reflux disease)    Headache 10/13/2013   High serum vitamin D 05/18/2021   History of chronic bronchitis    History of colonic polyp 02/25/2019   History of hiatal hernia    History of tachycardia    CONTROLLED  WITH ATENOLOL   Hyperglycemia 03/08/2021   Hyperkalemia 09/28/2022   Hyperlipidemia    Intertrigo 12/13/2022   Kidney stone 02/26/2019   Knee pain, right 04/30/2015   Left hip pain 04/30/2015   Leg pain 02/24/2011   Qualifier: Diagnosis of   By: Janit Bern  Leukocytosis 07/05/2022   Local reaction to hymenoptera sting 05/09/2023   Low back pain 09/08/2020   Major depressive disorder 02/05/2013   Malignant neoplasm of lower-outer quadrant of right breast of female, estrogen receptor positive 01/03/2013   Nasal turbinate hypertrophy 05/12/2016   Oral herpes 02/05/2023   OSA and COPD overlap syndrome 06/10/2020   Osteoporosis 01/2019   T score -2.2 stable/improved from prior study   Pelvic pain    Peripheral neuropathy 05/22/2014   Personal history of radiation therapy    Pneumonia    Primary insomnia 06/25/2017   Pulmonary nodules 02/12/2015   Rash and other  nonspecific skin eruption 05/09/2023   Recurrent falls 05/02/2020   Recurrent sinusitis 02/25/2019   Right arm pain 07/20/2016   RLS (restless legs syndrome) 11/09/2019   RUQ pain 07/05/2022   S/P radiation therapy 11/12/12 - 12/05/12   right Breast   Sciatica 04/30/2015   Sepsis 2014   from UTI    Sjogren's disease 12/12/2021   Splenic lesion 09/02/2012   Sprain of lateral ligament of ankle joint 07/31/2023   Stage 3 chronic kidney disease    Statin intolerance 02/29/2020   Stricture and stenosis of esophagus 10/19/2011   Tongue lesion 02/05/2023   Tremor 07/05/2022   Urgency of urination    Vertigo 01/22/2023   Vitamin B12 deficiency 03/08/2021   Vocal cord cyst 05/18/2021    Past Surgical History:  Procedure Laterality Date   24 HOUR PH STUDY N/A 04/03/2016   Procedure: 24 HOUR PH STUDY;  Surgeon: Ruffin Frederick, MD;  Location: [facility];  Service: Gastroenterology;  Laterality: N/A;   BREAST BIOPSY Right 08/23/2012   ADH   BREAST BIOPSY Right 10/11/2012   Ductal Carcinoma   BREAST EXCISIONAL BIOPSY Right 09/04/2013   benign   BREAST LUMPECTOMY Right 10/11/2012   W/ [facility] BX   CARDIAC CATHETERIZATION  09-13-2007  DR Riley Kill   WELL-PRESERVED LVF/  DIFFUSE SCATTERED CORONARY CALCIFACATION AND ATHEROSCLEROSIS WITHOUT OBSTRUCTION   CARDIAC CATHETERIZATION  08-04-2010  DR MCALHANY   NON-OBSTRUCTIVE CAD/  pLAD 40%/  oLAD 30%/  mLAD 30%/  pRCA 30%/  EF 60%   CARDIOVASCULAR STRESS TEST  06-18-2012  DR McALHANY   LOW RISK NUCLEAR STUDY/  SMALL FIXED AREA OF MODERATELY DECREASED UPTAKE IN ANTEROSEPTAL WALL WHICH MAY BE ARTIFACTUAL/  NO ISCHEMIA/  EF 68%   COLONOSCOPY  09/29/2010   CYSTOSCOPY     CYSTOSCOPY WITH HYDRODISTENSION AND BIOPSY N/A 03/06/2014   Procedure: CYSTOSCOPY/HYDRODISTENSION/ INSTILATION OF MARCAINE AND PYRIDIUM;  Surgeon: Kathi Ludwig, MD;  Location: [facility] [place];  Service: Urology;  Laterality: N/A;    CYSTOSCOPY/URETEROSCOPY/HOLMIUM LASER/STENT PLACEMENT Left 03/21/2023   Procedure: CYSTOSCOPY LEFT RETROGRADE PYELOGRAM, LEFT URETEROSCOPY, HOLMIUM LASER LITHOTRIPSYM, AND LEFT URETERAL STENT PLACEMENT;  Surgeon: Rene Paci, MD;  Location: [facility];  Service: Urology;  Laterality: Left;  60 MINUTES   ESOPHAGEAL MANOMETRY N/A 04/03/2016   Procedure: ESOPHAGEAL MANOMETRY (EM);  Surgeon: Ruffin Frederick, MD;  Location: [facility];  Service: Gastroenterology;  Laterality: N/A;   EXTRACORPOREAL SHOCK WAVE LITHOTRIPSY Left 02/06/2019   Procedure: EXTRACORPOREAL SHOCK WAVE LITHOTRIPSY (ESWL);  Surgeon: Ihor Gully, MD;  Location: [facility];  Service: Urology;  Laterality: Left;   NASAL SINUS SURGERY  1985   ORIF RIGHT ANKLE  FX  2006   POLYPECTOMY     REMOVAL VOCAL CORD CYST  01/2013   RIGHT BREAST BX  08/23/2012   RIGHT HAND SURGERY  X3  LAST ONE  2009   INCLUDES  ORIF RIGHT 5TH FINGER AND REVISION TWICE   SKIN CANCER EXCISION     face,arms   TONSILLECTOMY AND ADENOIDECTOMY  AGE 4   TOTAL ABDOMINAL HYSTERECTOMY W/ BILATERAL SALPINGOOPHORECTOMY  1982   W/  APPENDECTOMY   TRANSTHORACIC ECHOCARDIOGRAM  06/24/2012   GRADE I DIASTOLIC DYSFUNCTION/  EF 55-60%/  MILD MR   UPPER GASTROINTESTINAL ENDOSCOPY      Family History  Problem Relation Age of Onset   Rectal cancer Mother    Colon cancer Mother    Pancreatic cancer Mother    Diabetes Mother    Breast cancer Mother 20   Breast cancer Maternal Aunt        breast   Birth defects Maternal Aunt    Irritable bowel syndrome Son    Heart disease Son        CAD, MV replacement   Allergic Disorder Daughter    Diabetes Daughter    Colon cancer Father    Colon polyps Father    Diabetes Father    Stroke Father    Heart disease Father        CHF   Hyperlipidemia Father    Hypertension Father    Arthritis Father    Breast cancer Paternal Aunt    Breast cancer Paternal Aunt    Arthritis Paternal Uncle    Allergic Disorder  Daughter    Heart disease Cousin        CAD, had a blood clot after stenting   Esophageal cancer Neg Hx    Stomach cancer Neg Hx     Social History   Socioeconomic History   Marital status: Married    Spouse name: Windy Fast    Number of children: 3   Years of education: 16   Highest education level: Bachelor's degree (e.g., BA, AB, BS)  Occupational History   Occupation: Retired    Comment: Charity fundraiser  Tobacco Use   Smoking status: Former    Current packs/day: 0.00    Average packs/day: 1 pack/day for 15.0 years (15.0 ttl pk-yrs)    Types: Cigarettes    Start date: 05/27/1990    Quit date: 05/27/2005    Years since quitting: 18.3   Smokeless tobacco: Never  Vaping Use   Vaping status: Never Used  Substance and Sexual Activity   Alcohol use: No    Alcohol/week: 0.0 standard drinks of alcohol   Drug use: No   Sexual activity: Not Currently    Partners: Male    Birth control/protection: Surgical    Comment: 1st intercourse 77 yo-Fewer than 5 partners, hysterectomy  Other Topics Concern   Not on file  Social History Narrative   Lives with husband.   Caffeine use: 1/2 cup per day   Exercise-- 2days a week [employer],  Water aerobics, walking       Retired Engineer, civil (consulting)   No dietary restrictions, tries to maintain a heart healthy diet   Very pleasant lady   Social Determinants of Health   Financial Resource Strain: Low Risk  (05/01/2023)   Overall Financial Resource Strain (CARDIA)    Difficulty of Paying Living Expenses: Not hard at all  Food Insecurity: No Food Insecurity (09/12/2023)   Hunger Vital Sign    Worried About Running Out of Food in the Last Year: Never true    Ran Out of Food in the Last Year: Never true  Transportation Needs: No Transportation Needs (09/12/2023)   PRAPARE - Transportation    Lack  of Transportation (Medical): No    Lack of Transportation (Non-Medical): No  Physical Activity: Insufficiently Active (05/01/2023)   Exercise Vital Sign    Days of Exercise per Week: 1  day    Minutes of Exercise per Session: 30 min  Stress: Stress Concern Present (05/01/2023)   [employer] of Occupational Health - Occupational Stress Questionnaire    Feeling of Stress : To some extent  Social Connections: Unknown (05/01/2023)   Social Connection and Isolation Panel [NHANES]    Frequency of Communication with Friends and Family: More than three times a week    Frequency of Social Gatherings with Friends and Family: Patient declined    Attends Religious Services: Not on Insurance claims handler of Clubs or Organizations: Yes    Attends Banker Meetings: More than 4 times per year    Marital Status: Married  Catering manager Violence: Not At Risk (09/12/2023)   Humiliation, Afraid, Rape, and Kick questionnaire    Fear of Current or Ex-Partner: No    Emotionally Abused: No    Physically Abused: No    Sexually Abused: No    Outpatient Medications Prior to Visit  Medication Sig Dispense Refill   acetaminophen (TYLENOL) 500 MG tablet Take 1,000 mg by mouth every 6 (six) hours as needed for mild pain or headache.      albuterol (VENTOLIN HFA) 108 (90 Base) MCG/ACT inhaler Inhale 2 puffs into the lungs every 4 (four) hours as needed for wheezing or shortness of breath (coughing fits). 18 g 1   aspirin EC 81 MG tablet Take 1 tablet (81 mg total) by mouth daily. 90 tablet 3   atenolol (TENORMIN) 25 MG tablet Take 1 tablet (25 mg total) by mouth 2 (two) times daily. Take 1 tablet by mouth 2 time a day 180 tablet 3   CALCIUM PO Take 500 mg by mouth daily.     carboxymethylcellul-glycerin (OPTIVE) 0.5-0.9 % ophthalmic solution Place 1 drop into both eyes daily as needed for dry eyes.     Cholecalciferol (VITAMIN D3) 50 MCG (2000 UT) CAPS Take 2,000 Units by mouth daily.     cyanocobalamin (VITAMIN B12) 1000 MCG tablet Take 1,000 mg by mouth daily with breakfast.     diclofenac Sodium (VOLTAREN) 1 % GEL Apply 2 g topically daily as needed (Back pain).     Docusate  Sodium (DSS) 100 MG CAPS Take 100 mg by mouth daily as needed (Constipation).     DULoxetine (CYMBALTA) 60 MG capsule Take 1 capsule by mouth twice daily 180 capsule 0   EPINEPHrine 0.3 mg/0.3 mL IJ SOAJ injection Inject 0.3 mg into the muscle as needed for anaphylaxis. 2 each 1   famotidine (PEPCID) 20 MG tablet Take 1 tablet (20 mg total) by mouth 2 (two) times daily as needed for heartburn or indigestion. 60 tablet 1   fluticasone (FLONASE) 50 MCG/ACT nasal spray Use 2 spray(s) in each nostril once daily (Patient taking differently: Place 2 sprays into both nostrils daily as needed for allergies or rhinitis.) 16 g 5   fluticasone (FLOVENT HFA) 110 MCG/ACT inhaler Inhale 2 puffs into the lungs 2 (two) times daily. 1 each 5   folic acid (FOLVITE) 1 MG tablet Take 1 tablet (1 mg total) by mouth daily.     furosemide (LASIX) 20 MG tablet Take 1 tablet (20 mg total) by mouth as needed for edema. 90 tablet 3   gabapentin (NEURONTIN) 300 MG capsule Take 300-900 mg  by mouth See admin instructions. Take 600 mg in the morning 300 mg at lunch time and 900 mg at bedtime     loratadine (CLARITIN) 10 MG tablet Take 10 mg by mouth daily as needed for rhinitis.     nitroGLYCERIN (NITROSTAT) 0.4 MG SL tablet Place 1 tablet (0.4 mg total) under the tongue every 5 (five) minutes as needed for chest pain. 25 tablet 1   nystatin cream (MYCOSTATIN) Apply 1 application topically 2 (two) times daily. (Patient taking differently: Apply 1 application  topically daily as needed (vaginal yeast).) 30 g 2   pentosan polysulfate (ELMIRON) 100 MG capsule Take 1 capsule (100 mg total) by mouth 3 (three) times daily. Reported on 01/05/2016 (Patient taking differently: Take 100 mg by mouth daily as needed (burning and pain). Reported on 01/05/2016) 90 capsule 11   polyethylene glycol (MIRALAX) 17 g packet Take 17 g by mouth 2 (two) times daily. 14 each 0   REPATHA SURECLICK 140 MG/ML SOAJ INJECT 140 MG INTO THE SKIN EVERY 14 DAYS 6 mL  0   sucralfate (CARAFATE) 1 GM/10ML suspension Take 10 mLs (1 g total) by mouth 4 (four) times daily -  with meals and at bedtime. 420 mL 1   traMADol (ULTRAM) 50 MG tablet Take 25 mg by mouth 2 (two) times daily.     traZODone (DESYREL) 100 MG tablet TAKE 1 TABLET BY MOUTH AT BEDTIME 90 tablet 0   Vitamin D, Ergocalciferol, (DRISDOL) 1.25 MG (50000 UNIT) CAPS capsule Take 1 capsule (50,000 Units total) by mouth every 7 (seven) days. 12x Weeks 5 capsule 3   hydrOXYzine (ATARAX) 10 MG tablet Take 1 tablet (10 mg total) by mouth 2 (two) times daily as needed for anxiety (not with Loratadine). 60 tablet 2   No facility-administered medications prior to visit.    Allergies  Allergen Reactions   Clindamycin Hcl Shortness Of Breath and Rash   Penicillins Anaphylaxis   Rosuvastatin Anaphylaxis   Baclofen Other (See Comments) and Rash   Lincomycin Other (See Comments)   Prednisone Rash    Other reaction(s): Rash, Hives - can tolerate if she takes with benadryl    Codeine Hives and Other (See Comments)    headache Other reaction(s): Headache, Nausea, headache   Doxycycline     Skin rash   Erythromycin Hives   Haemophilus Influenzae Other (See Comments)    Local reaction at the site Local reaction at the site   Haemophilus Influenzae Vaccines Other (See Comments)    Local reaction at site   Latex Hives   Levofloxacin     Other reaction(s): sick   Other Other (See Comments)    Father had malignant hyperthermia   Pentazocine Lactate Other (See Comments)    HALLUCINATION   Pneumococcal Vaccine Polyvalent Hives, Swelling and Other (See Comments)    REACTION: redness, swelling, and hives at injection site   Tamoxifen Nausea And Vomiting and Other (See Comments)    HEADACHE Other reaction(s): Headache, Nausea   Fluzone [Influenza Virus Vaccine] Hives    Local reaction at the site    Review of Systems  Constitutional:  Positive for malaise/fatigue. Negative for fever.  HENT:  Positive  for congestion and sore throat.   Eyes:  Negative for blurred vision.  Respiratory:  Positive for cough, sputum production and wheezing. Negative for hemoptysis and shortness of breath.   Cardiovascular:  Negative for chest pain, palpitations and leg swelling.  Gastrointestinal:  Negative for abdominal pain, blood in  stool and nausea.  Genitourinary:  Negative for dysuria and frequency.  Musculoskeletal:  Positive for myalgias. Negative for falls.  Skin:  Negative for rash.  Neurological:  Negative for dizziness, loss of consciousness and headaches.  Endo/Heme/Allergies:  Negative for environmental allergies.  Psychiatric/Behavioral:  Negative for depression. The patient is not nervous/anxious.        Objective:    Physical Exam Constitutional:      General: She is not in acute distress.    Appearance: Normal appearance. She is well-developed. She is not toxic-appearing.  HENT:     Head: Normocephalic and atraumatic.     Right Ear: External ear normal.     Left Ear: External ear normal.     Nose: Nose normal.  Eyes:     General:        Right eye: No discharge.        Left eye: No discharge.     Conjunctiva/sclera: Conjunctivae normal.  Neck:     Thyroid: No thyromegaly.  Cardiovascular:     Rate and Rhythm: Normal rate and regular rhythm.     Heart sounds: Normal heart sounds. No murmur heard. Pulmonary:     Effort: Pulmonary effort is normal. No respiratory distress.     Breath sounds: No rhonchi or rales.     Comments: Decreased Breath Sounds LLL Abdominal:     General: Bowel sounds are normal.     Palpations: Abdomen is soft.     Tenderness: There is no abdominal tenderness. There is no guarding.  Musculoskeletal:        General: Normal range of motion.     Cervical back: Neck supple.  Lymphadenopathy:     Cervical: No cervical adenopathy.  Skin:    General: Skin is warm and dry.  Neurological:     Mental Status: She is alert and oriented to person, place, and  time.  Psychiatric:        Mood and Affect: Mood normal.        Behavior: Behavior normal.        Thought Content: Thought content normal.        Judgment: Judgment normal.     BP 124/74 (BP Location: Left Arm, Patient Position: Sitting, Cuff Size: Normal)   Pulse 63   Temp 98 F (36.7 C) (Oral)   Resp 16   Ht 5\' 3"  (1.6 m)   Wt 121 lb (54.9 kg)   SpO2 96%   BMI 21.43 kg/m  Wt Readings from Last 3 Encounters:  09/20/23 121 lb (54.9 kg)  09/12/23 121 lb 1.3 oz (54.9 kg)  09/04/23 124 lb (56.2 kg)    Diabetic Foot Exam - Simple   No data filed    Lab Results  Component Value Date   WBC 8.1 08/28/2023   HGB 11.9 (L) 08/28/2023   HCT 38.4 08/28/2023   PLT 363.0 08/28/2023   GLUCOSE 83 08/28/2023   CHOL 131 08/28/2023   TRIG 122.0 08/28/2023   HDL 39.40 08/28/2023   LDLDIRECT 74.0 06/12/2023   LDLCALC 67 08/28/2023   ALT 8 08/28/2023   AST 15 08/28/2023   NA 140 08/28/2023   K 4.7 08/28/2023   CL 101 08/28/2023   CREATININE 1.11 08/28/2023   BUN 13 08/28/2023   CO2 32 08/28/2023   TSH 2.17 08/28/2023   INR 1.05 08/03/2010   HGBA1C 5.5 05/28/2023   MICROALBUR <0.7 08/08/2023    Lab Results  Component Value Date   TSH 2.17  08/28/2023   Lab Results  Component Value Date   WBC 8.1 08/28/2023   HGB 11.9 (L) 08/28/2023   HCT 38.4 08/28/2023   MCV 87.9 08/28/2023   PLT 363.0 08/28/2023   Lab Results  Component Value Date   NA 140 08/28/2023   K 4.7 08/28/2023   CHLORIDE 102 04/20/2015   CO2 32 08/28/2023   GLUCOSE 83 08/28/2023   BUN 13 08/28/2023   CREATININE 1.11 08/28/2023   BILITOT 0.5 08/28/2023   ALKPHOS 118 (H) 08/28/2023   AST 15 08/28/2023   ALT 8 08/28/2023   PROT 6.2 09/04/2023   ALBUMIN 3.6 08/28/2023   CALCIUM 9.2 08/28/2023   ANIONGAP 10 06/14/2023   EGFR 55 (L) 05/25/2023   GFR 47.73 (L) 08/28/2023   Lab Results  Component Value Date   CHOL 131 08/28/2023   Lab Results  Component Value Date   HDL 39.40 08/28/2023   Lab  Results  Component Value Date   LDLCALC 67 08/28/2023   Lab Results  Component Value Date   TRIG 122.0 08/28/2023   Lab Results  Component Value Date   CHOLHDL 3 08/28/2023   Lab Results  Component Value Date   HGBA1C 5.5 05/28/2023       Assessment & Plan:  Hyperlipidemia, unspecified hyperlipidemia type Assessment & Plan: Does not tolerate statins. Encourage heart healthy diet such as MIND or DASH diet, increase exercise, avoid trans fats, simple carbohydrates and processed foods, consider a krill or fish or flaxseed oil cap daily.     Hyperglycemia Assessment & Plan: hgba1c acceptable, minimize simple carbs. Increase exercise as tolerated. Continue current meds    Stage 3 chronic kidney disease, unspecified whether stage 3a or 3b CKD (HCC) Assessment & Plan: Hydrate and monitor    Statin intolerance Assessment & Plan: Unable to tolerate statins   Vitamin B12 deficiency Assessment & Plan: Supplement and monitor   Depression with anxiety Assessment & Plan: Struggling with high levels of anxiety   Cough, unspecified type -     DG Chest 2 View; Future  Other orders -     hydrOXYzine HCl; Take 1 tablet (10 mg total) by mouth 3 (three) times daily as needed for anxiety (not with Loratadine).  Dispense: 90 tablet; Refill: 2 -     Cefdinir; Take 1 capsule (300 mg total) by mouth 2 (two) times daily for 10 days.  Dispense: 20 capsule; Refill: 0 -     methylPREDNISolone; 5 tabs po x 1 day then 4 tabs po x 1 day then 3 tabs po x 1 day then 2 tabs po x 1 day then 1 tab po x 1 day and stop  Dispense: 15 tablet; Refill: 0 -     Benzonatate; Take 1 capsule (100 mg total) by mouth 3 (three) times daily as needed for cough.  Dispense: 60 capsule; Refill: 0    Assessment and Plan    Upper Respiratory Infection Rapid progression of symptoms from sore throat to runny nose to chest discomfort. Sore throat has improved but not resolved. Husband has similar symptoms. No  ear pain or severe headache. Decreased breath sounds in left lower lobe. -Order chest x-ray to rule out pneumonia. -Prescribe Cefdinir. -Prescribe Medrol pack for inflammation.  Chronic Itching Itching primarily on head, face, and upper body. No visible rash. Currently taking Atarax 10mg  twice daily with some relief. -Increase Atarax to 10mg  three times daily. -Prescribe Tessalon Perles for cough.  Iron Deficiency Anemia Scheduled for IV iron  infusion tomorrow. -Advise patient to call infusion center to confirm appointment given current symptoms.  General Health Maintenance -Encourage increased fluid intake due to coughing. -Follow-up if symptoms worsen.         Danise Edge, MD

## 2023-09-24 ENCOUNTER — Ambulatory Visit: Payer: Medicare Other | Admitting: Physical Therapy

## 2023-09-27 ENCOUNTER — Ambulatory Visit (INDEPENDENT_AMBULATORY_CARE_PROVIDER_SITE_OTHER): Payer: Medicare Other | Admitting: Professional

## 2023-09-27 ENCOUNTER — Inpatient Hospital Stay: Payer: Medicare Other | Attending: Hematology & Oncology

## 2023-09-27 ENCOUNTER — Encounter: Payer: Self-pay | Admitting: Professional

## 2023-09-27 VITALS — BP 136/76 | HR 65 | Temp 98.0°F | Resp 17

## 2023-09-27 DIAGNOSIS — F411 Generalized anxiety disorder: Secondary | ICD-10-CM | POA: Diagnosis not present

## 2023-09-27 DIAGNOSIS — F331 Major depressive disorder, recurrent, moderate: Secondary | ICD-10-CM | POA: Diagnosis not present

## 2023-09-27 DIAGNOSIS — D508 Other iron deficiency anemias: Secondary | ICD-10-CM | POA: Insufficient documentation

## 2023-09-27 DIAGNOSIS — K909 Intestinal malabsorption, unspecified: Secondary | ICD-10-CM | POA: Diagnosis not present

## 2023-09-27 MED ORDER — SODIUM CHLORIDE 0.9 % IV SOLN: Freq: Once | INTRAVENOUS | Status: AC

## 2023-09-27 MED ORDER — SODIUM CHLORIDE 0.9 % IV SOLN
510.0000 mg | Freq: Once | INTRAVENOUS | Status: AC
  Administered 2023-09-27: 510 mg via INTRAVENOUS
  Filled 2023-09-27: qty 17

## 2023-09-27 NOTE — Progress Notes (Addendum)
[facility] Counselor/Therapist Progress Note  Patient ID: [name], MRN: [id number],    Date: 09/27/2023  Time Spent: 54 minutes 906-10am   Treatment Type: Individual Therapy  Risk Assessment: Danger to Self:  No Self-injurious Behavior: No Danger to Others: No  Subjective: Patient arrived on time for her in-person session.  Issues addressed: -treatment planning completed with patient and Clinician -pt participated in and agrees with her treatment plan  Treatment Plan:   Goals Alleviate depressive symptoms Recognize, accept, and cope with depressive feelings Develop healthy thinking patterns Develop healthy interpersonal relationships (particularly younger daughter) Reduce overall frequency, intensity, and duration of anxiety Stabilize anxiety level while increasing ability to function Enhance ability to effectively cope with full variety of stressors Learn and implement coping skills that result in a reduction of anxiety    Objectives target date for all objectives is 09/26/2024 Verbalize an understanding of the cognitive, physiological, and behavioral components of anxiety Learning and implement calming skills to reduce overall anxiety Verbalize an understanding of the role that cognitive biases play in excessive irrational worry and persistent anxiety symptoms Identify, challenge, and replace biased fearful self-talk Learn and implement problem solving strategies Identify and engage in pleasant activities Learning and implement personal and interpersonal skills to reduce anxiety and improve interpersonal relationships Learn to accept limitations in life and commit to tolerating, rather than avoiding, unpleasant emotions while accomplishing meaningful goals Identify major life conflicts from the past and present that form the basis for present anxiety Maintain involvement in work, family, and social activities Reestablish a consistent sleep-wake  cycle Verbalize an accurate understanding of depression Identify and replace thoughts that support depression Learn and implement behavioral strategies Verbalize an understanding and resolution of current interpersonal problems Learn and implement decision making skills Learn and implement conflict resolution skills to resolve interpersonal problems Verbalize an understanding of healthy and unhealthy emotions verbalize insight into how past relationships may be influence current experiences with depression Use mindfulness and acceptance strategies and increase value based behavior  Increase hopeful statements about the future.  Interventions Engage the patient in behavioral activation Use instruction, modeling, and role-playing to build the patient 's general social, communication, and/or conflict resolution skills Use Acceptance and Commitment Therapy to help patient  accept uncomfortable realities in order to accomplish value-consistent goals Reinforce the patient 's insight into the role of her past emotional pain and present anxiety  Support the patient in following through with work, family, and social activities Teach and implement sleep hygiene practices  Teach the patient relaxation skills Assign the patient homework Discuss examples demonstrating that unrealistic worry overestimates the probability of threats and underestimates patient's ability  Assist the patient in analyzing his or her worries Help patient understand that avoidance is reinforcing  Consistent with treatment model, discuss how change in cognitive, behavioral, and interpersonal can help patient alleviate depression CBT Behavioral activation to help the patient explore the relationship, nature of the dispute Help the patient develop new interpersonal skills and relationships Conduct Problem solving therapy Teach conflict resolution skills Use a process-experiential approach Conduct ACT  Diagnosis:Major  depressive disorder, recurrent episode, moderate (HCC)  Generalized anxiety disorder  Plan:  -meet on Thursday, October 04, 2023 at 2pm in person.                Teofilo Pod, [facility]

## 2023-09-27 NOTE — Patient Instructions (Signed)
 Ferumoxytol Injection What is this medication? FERUMOXYTOL (FER ue MOX i tol) treats low levels of iron in your body (iron deficiency anemia). Iron is a mineral that plays an important role in making red blood cells, which carry oxygen from your lungs to the rest of your body. This medicine may be used for other purposes; ask your health care provider or pharmacist if you have questions. COMMON BRAND NAME(S): Feraheme What should I tell my care team before I take this medication? They need to know if you have any of these conditions: Anemia not caused by low iron levels High levels of iron in the blood Magnetic resonance imaging (MRI) test scheduled An unusual or allergic reaction to iron, other medications, foods, dyes, or preservatives Pregnant or trying to get pregnant Breastfeeding How should I use this medication? This medication is injected into a vein. It is given by your care team in a hospital or clinic setting. Talk to your care team the use of this medication in children. Special care may be needed. Overdosage: If you think you have taken too much of this medicine contact a poison control center or emergency room at once. NOTE: This medicine is only for you. Do not share this medicine with others. What if I miss a dose? It is important not to miss your dose. Call your care team if you are unable to keep an appointment. What may interact with this medication? Other iron products This list may not describe all possible interactions. Give your health care provider a list of all the medicines, herbs, non-prescription drugs, or dietary supplements you use. Also tell them if you smoke, drink alcohol, or use illegal drugs. Some items may interact with your medicine. What should I watch for while using this medication? Visit your care team regularly. Tell your care team if your symptoms do not start to get better or if they get worse. You may need blood work done while you are taking this  medication. You may need to follow a special diet. Talk to your care team. Foods that contain iron include: whole grains/cereals, dried fruits, beans, or peas, leafy green vegetables, and organ meats (liver, kidney). What side effects may I notice from receiving this medication? Side effects that you should report to your care team as soon as possible: Allergic reactions--skin rash, itching, hives, swelling of the face, lips, tongue, or throat Low blood pressure--dizziness, feeling faint or lightheaded, blurry vision Shortness of breath Side effects that usually do not require medical attention (report to your care team if they continue or are bothersome): Flushing Headache Joint pain Muscle pain Nausea Pain, redness, or irritation at injection site This list may not describe all possible side effects. Call your doctor for medical advice about side effects. You may report side effects to FDA at 1-800-FDA-1088. Where should I keep my medication? This medication is given in a hospital or clinic. It will not be stored at home. NOTE: This sheet is a summary. It may not cover all possible information. If you have questions about this medicine, talk to your doctor, pharmacist, or health care provider.  2024 Elsevier/Gold Standard (2023-05-18 00:00:00)

## 2023-09-28 ENCOUNTER — Ambulatory Visit: Payer: Medicare Other | Admitting: Neurology

## 2023-09-28 DIAGNOSIS — G629 Polyneuropathy, unspecified: Secondary | ICD-10-CM

## 2023-09-28 NOTE — Procedures (Signed)
[facility]  [address] [place], [address]  [place], [place] [address] Tel: [phone] Fax: [phone] Test Date:  09/28/2023  Patient: [name] DOB: 1946-05-26 Physician: Nita Sickle, DO  Sex: Female Height: 5\' 3"  Ref Phys: Marlowe Kays, PA-C  ID#: [id number]   Technician:    History: This is a 77 year old female referred for evaluation of neuropathy.  NCV & EMG Findings: Extensive electrodiagnostic testing of the right lower extremity and additional studies of the left shows:  Bilateral sural and superficial peroneal sensory responses are absent. Bilateral peroneal motor responses are absent at the extensor digitorum brevis, normal at the tibialis anterior.  Bilateral tibial motor responses show reduced amplitude.  Bilateral tibial H reflex studies are absent. Chronic motor axonal loss changes are seen affecting the muscles below the knee as well as the L3-4 myotomes bilaterally.  There is no evidence of accompanying active denervation.  Impression: The electrophysiologic findings are consistent with a chronic and symmetric sensorimotor axonal polyneuropathy affecting the lower extremities.  Overall, these findings are severe in degree electrically. A superimposed chronic L3-L4 radiculopathy is also likely, correlate clinically.    ___________________________ Nita Sickle, DO    Nerve Conduction Studies   Stim Site NR Peak (ms) Norm Peak (ms) O-P Amp (V) Norm O-P Amp  Left Sup Peroneal Anti Sensory (Ant Lat Mall)  32 C  12 cm *NR  <4.6  >3  Right Sup Peroneal Anti Sensory (Ant Lat Mall)  32 C  12 cm *NR  <4.6  >3  Left Sural Anti Sensory (Lat Mall)  32 C  Calf *NR  <4.6  >3  Right Sural Anti Sensory (Lat Mall)  32 C  Calf *NR  <4.6  >3     Stim Site NR Onset (ms) Norm Onset (ms) O-P Amp (mV) Norm O-P Amp Site1 Site2 Delta-0 (ms) Dist (cm) Vel (m/s) Norm Vel (m/s)  Left Peroneal Motor (Ext Dig Brev)  32 C  Ankle *NR  <6.0  >2.5 B Fib Ankle  0.0   >40  B Fib *NR     Poplt B Fib  0.0  >40  Poplt *NR            Right Peroneal Motor (Ext Dig Brev)  32 C  Ankle *NR  <6.0  >2.5 B Fib Ankle  0.0  >40  B Fib *NR     Poplt B Fib  0.0  >40  Poplt *NR            Left Peroneal TA Motor (Tib Ant)  32 C  Fib Head    2.8 <4.5 3.1 >3 Poplit Fib Head 1.6 8.0 50 >40  Poplit    4.4 <5.7 3.0         Right Peroneal TA Motor (Tib Ant)  32 C  Fib Head    2.7 <4.5 3.3 >3 Poplit Fib Head 1.6 8.0 50 >40  Poplit    4.3 <5.7 3.1         Left Tibial Motor (Abd Hall Brev)  32 C  Ankle    4.5 <6.0 *1.2 >4 Knee Ankle 10.2 41.0 40 >40  Knee    14.7  0.6         Right Tibial Motor (Abd Hall Brev)  32 C  Ankle    4.9 <6.0 *1.0 >4 Knee Ankle 8.8 41.0 47 >40  Knee    13.7  0.5          Electromyography   Side  Muscle Ins.Act Fibs Fasc Recrt Amp Dur Poly Activation Comment  Right AntTibialis Nml Nml Nml *1- *1+ *1+ *1+ Nml N/A  Right Gastroc Nml Nml Nml *2- *1+ *1+ *1+ Nml N/A  Right Flex Dig Long Nml Nml Nml *2- *1+ *1+ *1+ Nml N/A  Right RectFemoris Nml Nml Nml *1- *1+ *1+ *1+ Nml N/A  Right BicepsFemS Nml Nml Nml Nml Nml Nml Nml Nml N/A  Right GluteusMed Nml Nml Nml Nml Nml Nml Nml Nml N/A  Right AdductorLong Nml Nml Nml *1- *1+ *1+ *1+ Nml N/A  Left AntTibialis Nml Nml Nml *1- *1+ *1+ *1+ Nml N/A  Left Gastroc Nml Nml Nml *2- *1+ *1+ *1+ Nml N/A  Left RectFemoris Nml Nml Nml *1- *1+ *1+ *1+ Nml N/A  Left GluteusMed Nml Nml Nml Nml Nml Nml Nml Nml N/A  Left Flex Dig Long Nml Nml Nml *2- *1+ *1+ *1+ Nml N/A      Waveforms:

## 2023-10-03 ENCOUNTER — Encounter: Payer: Self-pay | Admitting: Physical Therapy

## 2023-10-03 ENCOUNTER — Ambulatory Visit: Payer: Medicare Other | Attending: Gastroenterology | Admitting: Physical Therapy

## 2023-10-03 DIAGNOSIS — R1084 Generalized abdominal pain: Secondary | ICD-10-CM | POA: Diagnosis not present

## 2023-10-03 DIAGNOSIS — M6281 Muscle weakness (generalized): Secondary | ICD-10-CM | POA: Diagnosis not present

## 2023-10-03 DIAGNOSIS — R252 Cramp and spasm: Secondary | ICD-10-CM | POA: Diagnosis not present
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
## 2023-10-04 ENCOUNTER — Inpatient Hospital Stay: Payer: Medicare Other

## 2023-10-04 ENCOUNTER — Ambulatory Visit: Payer: Medicare Other | Admitting: Professional

## 2023-10-04 VITALS — BP 140/73 | HR 67 | Temp 97.8°F | Resp 17

## 2023-10-04 DIAGNOSIS — D508 Other iron deficiency anemias: Secondary | ICD-10-CM | POA: Diagnosis not present

## 2023-10-04 DIAGNOSIS — K909 Intestinal malabsorption, unspecified: Secondary | ICD-10-CM | POA: Diagnosis not present

## 2023-10-04 MED ORDER — SODIUM CHLORIDE 0.9 % IV SOLN
510.0000 mg | Freq: Once | INTRAVENOUS | Status: AC
  Administered 2023-10-04: 510 mg via INTRAVENOUS
  Filled 2023-10-04: qty 17

## 2023-10-04 MED ORDER — SODIUM CHLORIDE 0.9 % IV SOLN: Freq: Once | INTRAVENOUS | Status: AC

## 2023-10-04 NOTE — Patient Instructions (Signed)
 Ferumoxytol Injection What is this medication? FERUMOXYTOL (FER ue MOX i tol) treats low levels of iron in your body (iron deficiency anemia). Iron is a mineral that plays an important role in making red blood cells, which carry oxygen from your lungs to the rest of your body. This medicine may be used for other purposes; ask your health care provider or pharmacist if you have questions. COMMON BRAND NAME(S): Feraheme What should I tell my care team before I take this medication? They need to know if you have any of these conditions: Anemia not caused by low iron levels High levels of iron in the blood Magnetic resonance imaging (MRI) test scheduled An unusual or allergic reaction to iron, other medications, foods, dyes, or preservatives Pregnant or trying to get pregnant Breastfeeding How should I use this medication? This medication is injected into a vein. It is given by your care team in a hospital or clinic setting. Talk to your care team the use of this medication in children. Special care may be needed. Overdosage: If you think you have taken too much of this medicine contact a poison control center or emergency room at once. NOTE: This medicine is only for you. Do not share this medicine with others. What if I miss a dose? It is important not to miss your dose. Call your care team if you are unable to keep an appointment. What may interact with this medication? Other iron products This list may not describe all possible interactions. Give your health care provider a list of all the medicines, herbs, non-prescription drugs, or dietary supplements you use. Also tell them if you smoke, drink alcohol, or use illegal drugs. Some items may interact with your medicine. What should I watch for while using this medication? Visit your care team regularly. Tell your care team if your symptoms do not start to get better or if they get worse. You may need blood work done while you are taking this  medication. You may need to follow a special diet. Talk to your care team. Foods that contain iron include: whole grains/cereals, dried fruits, beans, or peas, leafy green vegetables, and organ meats (liver, kidney). What side effects may I notice from receiving this medication? Side effects that you should report to your care team as soon as possible: Allergic reactions--skin rash, itching, hives, swelling of the face, lips, tongue, or throat Low blood pressure--dizziness, feeling faint or lightheaded, blurry vision Shortness of breath Side effects that usually do not require medical attention (report to your care team if they continue or are bothersome): Flushing Headache Joint pain Muscle pain Nausea Pain, redness, or irritation at injection site This list may not describe all possible side effects. Call your doctor for medical advice about side effects. You may report side effects to FDA at 1-800-FDA-1088. Where should I keep my medication? This medication is given in a hospital or clinic. It will not be stored at home. NOTE: This sheet is a summary. It may not cover all possible information. If you have questions about this medicine, talk to your doctor, pharmacist, or health care provider.  2024 Elsevier/Gold Standard (2023-05-18 00:00:00)

## 2023-10-10 ENCOUNTER — Encounter: Payer: Medicare Other | Admitting: Physical Therapy

## 2023-10-10 ENCOUNTER — Telehealth: Payer: Self-pay | Admitting: *Deleted

## 2023-10-10 ENCOUNTER — Other Ambulatory Visit: Payer: Self-pay | Admitting: Family Medicine

## 2023-10-10 DIAGNOSIS — J449 Chronic obstructive pulmonary disease, unspecified: Secondary | ICD-10-CM

## 2023-10-10 NOTE — Telephone Encounter (Signed)
Patient called and left a message on the nurse line stating that she has not felt well since her last iron infusion 6 days ago, experiencing dizziness, fever, ears, head and neck hurt.  Called patient back and LMAM for patient to call us back since there was no personalized message on phone message.

## 2023-10-11 ENCOUNTER — Encounter: Payer: Self-pay | Admitting: Pharmacist

## 2023-10-11 DIAGNOSIS — R5383 Other fatigue: Secondary | ICD-10-CM | POA: Diagnosis not present

## 2023-10-11 DIAGNOSIS — R42 Dizziness and giddiness: Secondary | ICD-10-CM | POA: Diagnosis not present

## 2023-10-11 NOTE — Progress Notes (Signed)
10/11/2023 Name: [name] MRN: [id number] DOB: Jan 19, 1946  Chief Complaint  Patient presents with   Medication Management    [name] is a 77 y.o. year old female who presented for a telephone visit.   They were referred to the pharmacist by their Case Management Team  for assistance in managing medication access.    Subjective:  Medication Access/Adherence  Current Pharmacy:  [facility] [place], [place] - [address] [place] [address] Phone: [phone] Fax: [phone]  [facility] [address] [place], [place] - [address] [place] [address] Phone: [phone] Fax: [phone]   Patient reports affordability concerns with their medications: Yes  Patient reports access/transportation concerns to their pharmacy: No  Patient reports adherence concerns with their medications:  No     Hyperlipidemia/ASCVD Risk Reduction  Current lipid lowering medications: Repatha 140mg  SQ every 14 days. (LR for 84 DS on 07/17/2023) Medications tried in the past: rosuvastatin - anaphylaxis; atorvastatin - severe joint and muscle pains.   Antiplatelet regimen: aspirin 81mg  daily    Current medication access support: [employer] for $2500 expired on  10/09/2023   Objective:  Lab Results  Component Value Date   HGBA1C 5.5 05/28/2023    Lab Results  Component Value Date   CREATININE 1.11 08/28/2023   BUN 13 08/28/2023   NA 140 08/28/2023   K 4.7 08/28/2023   CL 101 08/28/2023   CO2 32 08/28/2023    Lab Results  Component Value Date   CHOL 131 08/28/2023   HDL 39.40 08/28/2023   LDLCALC 67 08/28/2023   LDLDIRECT 74.0 06/12/2023   TRIG 122.0 08/28/2023   CHOLHDL 3 08/28/2023    Medications Reviewed Today     Reviewed by Henrene Pastor, RPH-CPP (Pharmacist) on 10/11/23 at 1436  Med List Status: <None>   Medication Order  Taking? Sig Documenting Provider Last Dose Status Informant  acetaminophen (TYLENOL) 500 MG tablet [id number]  Take 1,000 mg by mouth every 6 (six) hours as needed for mild pain or headache.  [provider]  Active Self  albuterol (VENTOLIN HFA) 108 (90 Base) MCG/ACT inhaler [id number]  Inhale 2 puffs into the lungs every 4 (four) hours as needed for wheezing or shortness of breath (coughing fits). Ellamae Sia, DO  Active   aspirin EC 81 MG tablet [id number]  Take 1 tablet (81 mg total) by mouth daily. Kathleene Hazel, MD  Active Self  atenolol (TENORMIN) 25 MG tablet [id number]  Take 1 tablet (25 mg total) by mouth 2 (two) times daily. Take 1 tablet by mouth 2 time a day Lewayne Bunting, MD  Active   benzonatate (TESSALON PERLES) 100 MG capsule [id number]  Take 1 capsule (100 mg total) by mouth 3 (three) times daily as needed for cough. Bradd Canary, MD  Active   CALCIUM PO [id number]  Take 500 mg by mouth daily. [provider]  Active Self  carboxymethylcellul-glycerin (OPTIVE) 0.5-0.9 % ophthalmic solution [id number]  Place 1 drop into both eyes daily as needed for dry eyes. [provider]  Active Self  Cholecalciferol (VITAMIN D3) 50 MCG (2000 UT) CAPS [id number]  Take 2,000 Units by mouth daily. [provider]  Active Self  cyanocobalamin (VITAMIN B12) 1000 MCG tablet [id number]  Take 1,000 mg by mouth daily with breakfast. [provider]  Active Self  diclofenac Sodium (VOLTAREN) 1 % GEL [id number]  Apply 2 g topically daily as needed (Back pain). [provider]  Active Self  Docusate Sodium (DSS) 100 MG CAPS [id number]  Take 100 mg by mouth daily as needed (Constipation). [provider]  Active Self  DULoxetine (CYMBALTA) 60 MG capsule [id number]  Take 1 capsule by mouth twice daily Bradd Canary, MD  Active   EPINEPHrine 0.3 mg/0.3 mL IJ SOAJ injection [id number]  Inject 0.3 mg into the muscle as needed for anaphylaxis. Ellamae Sia, DO  Active   famotidine (PEPCID) 20 MG tablet [id number]  Take 1 tablet (20 mg total) by mouth 2 (two) times daily as needed for heartburn or indigestion. Bradd Canary, MD  Active   fluticasone Aleda Grana) 50 MCG/ACT nasal spray [id number]  Use 2 spray(s) in each nostril once daily  Patient taking differently: Place 2 sprays into both nostrils daily as needed for allergies or rhinitis.   Bradd Canary, MD  Active Self  fluticasone [facility] HFA) 110 MCG/ACT inhaler [id number]  Inhale 2 puffs into the lungs 2 (two) times daily. Bradd Canary, MD  Active   folic acid (FOLVITE) 1 MG tablet [id number]  Take 1 tablet (1 mg total) by mouth daily. Sandford Craze, NP  Active   furosemide (LASIX) 20 MG tablet [id number]  Take 1 tablet (20 mg total) by mouth as needed for edema. Lewayne Bunting, MD  Active Self  gabapentin (NEURONTIN) 300 MG capsule [id number]  Take 300-900 mg by mouth See admin instructions. Take 600 mg in the morning 300 mg at lunch time and 900 mg at bedtime Bradd Canary, MD  Active Self  hydrOXYzine (ATARAX) 10 MG tablet [id number]  Take 1 tablet (10 mg total) by mouth 3 (three) times daily as needed for anxiety (not with Loratadine). Bradd Canary, MD  Active   loratadine (CLARITIN) 10 MG tablet [id number]  Take 10 mg by mouth daily as needed for rhinitis. [provider]  Active Self  nitroGLYCERIN (NITROSTAT) 0.4 MG SL tablet [id number]  Place 1 tablet (0.4 mg total) under the tongue every 5 (five) minutes as needed for chest pain. Clayborne Dana, NP  Active Self           Med Note Dareen Piano, [facility]   Wed May 02, 2023 10:31 AM)    nystatin cream (MYCOSTATIN) [id number]  Apply 1 application topically 2 (two) times daily.  Patient taking differently: Apply 1 application  topically daily as needed (vaginal yeast).   Bradd Canary, MD  Active Self  pentosan polysulfate (ELMIRON) 100 MG capsule [id number]  Take 1 capsule (100 mg total) by mouth 3 (three) times daily.  Reported on 01/05/2016  Patient taking differently: Take 100 mg by mouth daily as needed (burning and pain). Reported on 01/05/2016   Ok Edwards, MD  Active Self  polyethylene glycol (MIRALAX) 17 g packet [id number]  Take 17 g by mouth 2 (two) times daily. Benancio Deeds, MD  Active   REPATHA SURECLICK 140 MG/ML SOAJ [id number]  INJECT 140 MG INTO THE SKIN EVERY 14 DAYS Bradd Canary, MD  Active   sucralfate (CARAFATE) 1 GM/10ML suspension [id number]  Take 10 mLs (1 g total) by mouth 4 (four) times daily -  with meals and at bedtime. Donato Schultz, DO  Active Self  traMADol (ULTRAM) 50 MG tablet [id number]  Take 25 mg by mouth 2 (two) times daily. [provider]  Active Self  traZODone (DESYREL) 100 MG tablet [id number]  TAKE 1 TABLET BY MOUTH AT BEDTIME Bradd Canary, MD  Active   Vitamin D, Ergocalciferol, (DRISDOL) 1.25 MG (50000 UNIT) CAPS capsule [id number]  Take 1 capsule (50,000 Units total) by mouth every 7 (seven) days. 12x Marilynn Latino, MD  Active Self              Assessment/Plan:   Hyperlipidemia/ASCVD Risk Reduction with history of statin myopathy - Currently controlled.  - Recommend to continue Repatha 140mg  every 14 days. - Was able to re-enroll patient for [facility] for Hypercholesterolemia - she has $2500 to use toward cost of Repatha thru 10/09/2024      Follow Up Plan: January 2025   Henrene Pastor, PharmD Clinical Pharmacist [facility]

## 2023-10-12 ENCOUNTER — Encounter: Payer: Self-pay | Admitting: Professional

## 2023-10-12 ENCOUNTER — Ambulatory Visit: Payer: Medicare Other | Admitting: Professional

## 2023-10-12 DIAGNOSIS — R5383 Other fatigue: Secondary | ICD-10-CM | POA: Diagnosis not present

## 2023-10-12 DIAGNOSIS — F331 Major depressive disorder, recurrent, moderate: Secondary | ICD-10-CM | POA: Diagnosis not present

## 2023-10-12 DIAGNOSIS — F411 Generalized anxiety disorder: Secondary | ICD-10-CM | POA: Diagnosis not present

## 2023-10-12 NOTE — Progress Notes (Signed)
[facility] Counselor/Therapist Progress Note  Patient ID: [name], MRN: [id number],    Date: 10/12/2023  Time Spent: 51 minutes [id number]   Treatment Type: Individual Therapy  Risk Assessment: Danger to Self:  No Self-injurious Behavior: No Danger to Others: No  Subjective: Patient arrived late for her in-person session.  Issues addressed: 1-depression -patient reports feeling better than our previous session -she admits that she thinks the therapy and medication are helpful -pt has worked on identifying her negative thinking and stopping the thoughts -educated and identified signs and symptoms -she is feeling more empowered in her ability to choose how she feels 2-anxiety -patient reports the anxiety is improved as well -educated and identified signs and symptoms 3-positives -she is much like a sponge and writes down information when educating her on depression and anxiety -pt feels happier and more in control -she is disappointed that she did not seek help sooner -pt is more in tune with her triggers and feelings -she has verbalized to her spouse how much better she feels  Treatment Plan:   Goals Alleviate depressive symptoms Recognize, accept, and cope with depressive feelings Develop healthy thinking patterns Develop healthy interpersonal relationships (particularly younger daughter) Reduce overall frequency, intensity, and duration of anxiety Stabilize anxiety level while increasing ability to function Enhance ability to effectively cope with full variety of stressors Learn and implement coping skills that result in a reduction of anxiety    Objectives target date for all objectives is 09/26/2024 Verbalize an understanding of the cognitive, physiological, and behavioral components of anxiety Learning and implement calming skills to reduce overall anxiety Verbalize an understanding of the role that cognitive biases play in excessive  irrational worry and persistent anxiety symptoms Identify, challenge, and replace biased fearful self-talk Learn and implement problem solving strategies Identify and engage in pleasant activities Learning and implement personal and interpersonal skills to reduce anxiety and improve interpersonal relationships Learn to accept limitations in life and commit to tolerating, rather than avoiding, unpleasant emotions while accomplishing meaningful goals Identify major life conflicts from the past and present that form the basis for present anxiety Maintain involvement in work, family, and social activities Reestablish a consistent sleep-wake cycle Verbalize an accurate understanding of depression Identify and replace thoughts that support depression Learn and implement behavioral strategies Verbalize an understanding and resolution of current interpersonal problems Learn and implement decision making skills Learn and implement conflict resolution skills to resolve interpersonal problems Verbalize an understanding of healthy and unhealthy emotions verbalize insight into how past relationships may be influence current experiences with depression Use mindfulness and acceptance strategies and increase value based behavior  Increase hopeful statements about the future.  Interventions Engage the patient in behavioral activation Use instruction, modeling, and role-playing to build the patient 's general social, communication, and/or conflict resolution skills Use Acceptance and Commitment Therapy to help patient  accept uncomfortable realities in order to accomplish value-consistent goals Reinforce the patient 's insight into the role of her past emotional pain and present anxiety  Support the patient in following through with work, family, and social activities Teach and implement sleep hygiene practices  Teach the patient relaxation skills Assign the patient homework Discuss examples demonstrating  that unrealistic worry overestimates the probability of threats and underestimates patient's ability  Assist the patient in analyzing his or her worries Help patient understand that avoidance is reinforcing  Consistent with treatment model, discuss how change in cognitive, behavioral, and interpersonal can help patient alleviate depression CBT Behavioral activation  to help the patient explore the relationship, nature of the dispute Help the patient develop new interpersonal skills and relationships Conduct Problem solving therapy Teach conflict resolution skills Use a process-experiential approach Conduct ACT  Diagnosis:Major depressive disorder, recurrent episode, moderate (HCC)  Generalized anxiety disorder  Plan:  -meet on Friday, October 26, 2023 at 1pm in person.

## 2023-10-15 ENCOUNTER — Ambulatory Visit: Payer: Medicare Other | Admitting: Family Medicine

## 2023-10-17 ENCOUNTER — Encounter: Payer: Medicare Other | Admitting: Physical Therapy

## 2023-10-21 NOTE — Assessment & Plan Note (Signed)
Encouraged to get adequate exercise, calcium and vitamin d intake 

## 2023-10-21 NOTE — Assessment & Plan Note (Signed)
Well controlled, no changes to meds. Encouraged heart healthy diet such as the DASH diet and exercise as tolerated.  °

## 2023-10-21 NOTE — Assessment & Plan Note (Signed)
monitor

## 2023-10-21 NOTE — Assessment & Plan Note (Signed)
Following with pulmonology, no changes.

## 2023-10-21 NOTE — Assessment & Plan Note (Signed)
Unable to tolerate statins 

## 2023-10-21 NOTE — Assessment & Plan Note (Signed)
Following with pulmonology Dr byrum

## 2023-10-21 NOTE — Assessment & Plan Note (Signed)
Hydrate and monitor 

## 2023-10-21 NOTE — Assessment & Plan Note (Signed)
Does not tolerate statins. Encourage heart healthy diet such as MIND or DASH diet, increase exercise, avoid trans fats, simple carbohydrates and processed foods, consider a krill or fish or flaxseed oil cap daily.

## 2023-10-21 NOTE — Assessment & Plan Note (Signed)
Supplement and monitor 

## 2023-10-22 ENCOUNTER — Ambulatory Visit (INDEPENDENT_AMBULATORY_CARE_PROVIDER_SITE_OTHER): Payer: Medicare Other | Admitting: Family Medicine

## 2023-10-22 ENCOUNTER — Encounter: Payer: Self-pay | Admitting: Family Medicine

## 2023-10-22 VITALS — BP 128/82 | HR 72 | Temp 97.5°F | Resp 16 | Ht 63.0 in | Wt 124.0 lb

## 2023-10-22 DIAGNOSIS — I10 Essential (primary) hypertension: Secondary | ICD-10-CM | POA: Diagnosis not present

## 2023-10-22 DIAGNOSIS — M810 Age-related osteoporosis without current pathological fracture: Secondary | ICD-10-CM

## 2023-10-22 DIAGNOSIS — N189 Chronic kidney disease, unspecified: Secondary | ICD-10-CM

## 2023-10-22 DIAGNOSIS — Z789 Other specified health status: Secondary | ICD-10-CM

## 2023-10-22 DIAGNOSIS — J4489 Other specified chronic obstructive pulmonary disease: Secondary | ICD-10-CM

## 2023-10-22 DIAGNOSIS — Z1231 Encounter for screening mammogram for malignant neoplasm of breast: Secondary | ICD-10-CM

## 2023-10-22 DIAGNOSIS — E538 Deficiency of other specified B group vitamins: Secondary | ICD-10-CM

## 2023-10-22 DIAGNOSIS — E785 Hyperlipidemia, unspecified: Secondary | ICD-10-CM

## 2023-10-22 DIAGNOSIS — J45909 Unspecified asthma, uncomplicated: Secondary | ICD-10-CM

## 2023-10-22 DIAGNOSIS — F418 Other specified anxiety disorders: Secondary | ICD-10-CM

## 2023-10-22 DIAGNOSIS — D508 Other iron deficiency anemias: Secondary | ICD-10-CM

## 2023-10-22 DIAGNOSIS — R7989 Other specified abnormal findings of blood chemistry: Secondary | ICD-10-CM

## 2023-10-22 NOTE — Patient Instructions (Addendum)
Call Dr Milbert Coulter at [facility] and confirm when next appt is or if you need one? Saw the appt are counseled  [place] in 2029 but sooner if you   Anemia  Anemia is a condition in which there are not enough red blood cells or hemoglobin in the blood. Hemoglobin is a substance in red blood cells that carries oxygen. When you do not have enough red blood cells or hemoglobin (are anemic), your body cannot get enough oxygen, and your organs may not work properly. As a result, you may feel very tired or have other problems. What are the causes? Common causes of anemia include: Excessive bleeding. Anemia can be caused by excessive bleeding inside or outside the body, including bleeding from the intestines or from heavy menstrual periods in females. Poor nutrition. Long-lasting (chronic) kidney, thyroid, and liver disease. Bone marrow disorders, spleen problems, and blood disorders. Cancer and treatments for cancer. Human immunodeficiency virus (HIV) and acquired immunodeficiency syndrome (AIDS). Infections, medicines, and autoimmune disorders that destroy red blood cells. What are the signs or symptoms? Symptoms of this condition include: Minor weakness. Dizziness. Headache, or difficulties concentrating and sleeping. Heartbeats that feel irregular or faster than normal (palpitations). Shortness of breath, especially with exercise. Pale skin, lips, and nails, or cold hands and feet. Upset stomach (indigestion) and nausea. Symptoms may occur suddenly or develop slowly. If your anemia is mild, you may not have symptoms. How is this diagnosed? This condition is diagnosed based on blood tests, your medical history, and a physical exam. In some cases, a test may be needed in which cells are removed from the soft tissue inside of a bone and looked at under a microscope (bone marrow biopsy). Your health care provider may also check your stool (feces) for blood and may do more testing to look for the cause  of your bleeding. Other tests may include: Imaging tests, such as a CT scan or MRI. A procedure to see inside your esophagus and stomach (endoscopy). The esophagus is the part of the body that moves food from your mouth to your stomach. A procedure to see inside your colon and rectum (colonoscopy). How is this treated? Treatment for this condition depends on the cause. If you continue to lose a lot of blood, you may need to be treated at a hospital. Treatment may include: Taking supplements of iron, vitamin B12, or folic acid. Taking a hormone medicine (erythropoietin) that can help to stimulate red blood cell growth. Receiving donated blood through an IV (blood transfusion). This may be needed if you lose a lot of blood. Making changes to your diet. Having surgery to remove your spleen. Follow these instructions at home: Take over-the-counter and prescription medicines only as told by your health care provider. Take supplements only as told by your health care provider. Follow any diet instructions that you were given by your health care provider. Keep all follow-up visits. Your health care provider will want to recheck your blood tests. Contact a health care provider if: You develop new bleeding anywhere in the body. You are very weak. Get help right away if: You are short of breath. You have pain in your abdomen or chest. You are dizzy or feel faint. You have trouble concentrating. You have bloody stools, black stools, or tarry stools. You vomit repeatedly or you vomit up blood. These symptoms may be an emergency. Get help right away. Call 911. Do not wait to see if the symptoms will go away. Do not drive  yourself to the hospital. Summary Anemia is a condition in which you do not have enough red blood cells or enough of a substance in your red blood cells that carries oxygen. Symptoms may occur suddenly or develop slowly. If your anemia is mild, you may not have symptoms. This  condition is diagnosed with blood tests, a medical history, and a physical exam. Other tests may be needed. Treatment for this condition depends on the cause of the anemia. This information is not intended to replace advice given to you by your health care provider. Make sure you discuss any questions you have with your health care provider. Document Revised: 03/06/2022 Document Reviewed: 03/06/2022 Elsevier Patient Education  2024 [employer].

## 2023-10-22 NOTE — Assessment & Plan Note (Signed)
Working with behavioral health now, counselor Teofilo Pod and she is doing much better.

## 2023-10-22 NOTE — Assessment & Plan Note (Signed)
Continues to follow with Hematology and she is getting iron transfusions

## 2023-10-22 NOTE — Progress Notes (Signed)
Subjective:    Patient ID: [name], female    DOB: 03-27-1946, 77 y.o.   MRN: [id number]  Chief Complaint  Patient presents with   Follow-up    HPI Discussed the use of AI scribe software for clinical note transcription with the patient, who gave verbal consent to proceed.  History of Present Illness   The patient, with a history of low iron levels, is currently undergoing iron transfusions. They are due to see their hematologist on the 13th to assess their progress and determine the need for further transfusions. The patient reports that their iron levels are not yet at the desired level.  In addition to this, the patient has been referred to an endocrinologist by Dr. Kathi Ludwig. The endocrinologist has ordered several blood tests, the results of which the patient is awaiting. The patient expresses confusion about the nature of these tests, which include aldosterone and renin activity tests.  The patient also mentions a recent referral to a neuropsychologist, Dr. Milbert Coulter. There seems to be some confusion about follow-up appointments with Dr. Milbert Coulter, which the patient will clarify.  The patient has also started seeing a counselor, [name], on a weekly basis. This has had a significant positive impact on the patient's mental health, particularly in managing their depression and anxiety. The patient speaks highly of the counselor and the coping strategies she has provided.  The patient also mentions a persistent back issue, but does not elaborate on this. They express a desire to avoid surgery and seem to be managing the issue as best they can.  Finally, the patient discusses their recent mammogram and the need for a follow-up. They express a desire to stay on top of this and other health issues, despite feeling overwhelmed at times.        Past Medical History:  Diagnosis Date   Abdominal pain 10/26/2013   Allergic rhinitis 01/25/2016   ANA positive 07/29/2013   Anemia     Arthralgia 08/18/2013   Arthritis    Asthma, allergic 08/05/2012   Atrial flutter    past history- not current   Atypical chest pain 01/01/2015   Bilateral carpal tunnel syndrome 03/04/2015   CAD in native artery 08/25/2010   Minor CAD 2009, 2011, low risk Myoview 2013 and 2016        Cataract    bilaterally removed    Cellulitis of breast 02/05/2023   Chronic constipation    Chronic cough 01/09/2017   Chronic interstitial cystitis 02/01/2017   Chronic night sweats 01/08/2015   Common bile duct dilatation 02/13/2023   Concussion    x 3   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   DDD (degenerative disc disease), cervical 09/17/2018   Degenerative scoliosis 04/22/2018   Deviated septum 05/12/2016   Dysphagia    Dyspnea    Dysuria 03/09/2021   Elevated alkaline phosphatase level 01/25/2023   Essential hypertension 08/25/2010   External hemorrhoids 02/05/2015   Facial trauma 01/22/2023   Family history of adverse reaction to anesthesia    Family history of malignant hyperthermia    father had this   Fibrocystic breast disease 10/18/2010   Fibromyalgia    Frequency of urination    Generalized anxiety disorder 08/03/2010   GERD (gastroesophageal reflux disease)    Headache 10/13/2013   High serum vitamin D 05/18/2021   History of chronic bronchitis    History of colonic polyp 02/25/2019   History of hiatal hernia    History of tachycardia  CONTROLLED  WITH ATENOLOL   Hyperglycemia 03/08/2021   Hyperkalemia 09/28/2022   Hyperlipidemia    Intertrigo 12/13/2022   Kidney stone 02/26/2019   Knee pain, right 04/30/2015   Left hip pain 04/30/2015   Leg pain 02/24/2011   Qualifier: Diagnosis of   By: Laury Axon DOMyrene Buddy         Leukocytosis 07/05/2022   Local reaction to hymenoptera sting 05/09/2023   Low back pain 09/08/2020   Major depressive disorder 02/05/2013   Malignant neoplasm of lower-outer quadrant of right breast of female, estrogen receptor positive 01/03/2013    Nasal turbinate hypertrophy 05/12/2016   Oral herpes 02/05/2023   OSA and COPD overlap syndrome 06/10/2020   Osteoporosis 01/2019   T score -2.2 stable/improved from prior study   Pelvic pain    Peripheral neuropathy 05/22/2014   Personal history of radiation therapy    Pneumonia    Primary insomnia 06/25/2017   Pulmonary nodules 02/12/2015   Rash and other nonspecific skin eruption 05/09/2023   Recurrent falls 05/02/2020   Recurrent sinusitis 02/25/2019   Right arm pain 07/20/2016   RLS (restless legs syndrome) 11/09/2019   RUQ pain 07/05/2022   S/P radiation therapy 11/12/12 - 12/05/12   right Breast   Sciatica 04/30/2015   Sepsis 2014   from UTI    Sjogren's disease 12/12/2021   Splenic lesion 09/02/2012   Sprain of lateral ligament of ankle joint 07/31/2023   Stage 3 chronic kidney disease    Statin intolerance 02/29/2020   Stricture and stenosis of esophagus 10/19/2011   Tongue lesion 02/05/2023   Tremor 07/05/2022   Urgency of urination    Vertigo 01/22/2023   Vitamin B12 deficiency 03/08/2021   Vocal cord cyst 05/18/2021    Past Surgical History:  Procedure Laterality Date   24 HOUR PH STUDY N/A 04/03/2016   Procedure: 24 HOUR PH STUDY;  Surgeon: Ruffin Frederick, MD;  Location: [facility];  Service: Gastroenterology;  Laterality: N/A;   BREAST BIOPSY Right 08/23/2012   ADH   BREAST BIOPSY Right 10/11/2012   Ductal Carcinoma   BREAST EXCISIONAL BIOPSY Right 09/04/2013   benign   BREAST LUMPECTOMY Right 10/11/2012   W/ [facility]N BX   CARDIAC CATHETERIZATION  09-13-2007  DR Riley Kill   WELL-PRESERVED LVF/  DIFFUSE SCATTERED CORONARY CALCIFACATION AND ATHEROSCLEROSIS WITHOUT OBSTRUCTION   CARDIAC CATHETERIZATION  08-04-2010  DR MCALHANY   NON-OBSTRUCTIVE CAD/  pLAD 40%/  oLAD 30%/  mLAD 30%/  pRCA 30%/  EF 60%   CARDIOVASCULAR STRESS TEST  06-18-2012  DR McALHANY   LOW RISK NUCLEAR STUDY/  SMALL FIXED AREA OF MODERATELY DECREASED UPTAKE IN ANTEROSEPTAL WALL  WHICH MAY BE ARTIFACTUAL/  NO ISCHEMIA/  EF 68%   COLONOSCOPY  09/29/2010   CYSTOSCOPY     CYSTOSCOPY WITH HYDRODISTENSION AND BIOPSY N/A 03/06/2014   Procedure: CYSTOSCOPY/HYDRODISTENSION/ INSTILATION OF MARCAINE AND PYRIDIUM;  Surgeon: Kathi Ludwig, MD;  Location: [facility];  Service: Urology;  Laterality: N/A;   CYSTOSCOPY/URETEROSCOPY/HOLMIUM LASER/STENT PLACEMENT Left 03/21/2023   Procedure: CYSTOSCOPY LEFT RETROGRADE PYELOGRAM, LEFT URETEROSCOPY, HOLMIUM LASER LITHOTRIPSYM, AND LEFT URETERAL STENT PLACEMENT;  Surgeon: Rene Paci, MD;  Location: [facility];  Service: Urology;  Laterality: Left;  60 MINUTES   ESOPHAGEAL MANOMETRY N/A 04/03/2016   Procedure: ESOPHAGEAL MANOMETRY (EM);  Surgeon: Ruffin Frederick, MD;  Location: [facility];  Service: Gastroenterology;  Laterality: N/A;   EXTRACORPOREAL SHOCK WAVE LITHOTRIPSY Left 02/06/2019   Procedure: EXTRACORPOREAL SHOCK WAVE LITHOTRIPSY (ESWL);  Surgeon: Ihor Gully, MD;  Location: [facility];  Service: Urology;  Laterality: Left;   NASAL SINUS SURGERY  1985   ORIF RIGHT ANKLE  FX  2006   POLYPECTOMY     REMOVAL VOCAL CORD CYST  01/2013   RIGHT BREAST BX  08/23/2012   RIGHT HAND SURGERY  X3  LAST ONE 2009   INCLUDES  ORIF RIGHT 5TH FINGER AND REVISION TWICE   SKIN CANCER EXCISION     face,arms   TONSILLECTOMY AND ADENOIDECTOMY  AGE 32   TOTAL ABDOMINAL HYSTERECTOMY W/ BILATERAL SALPINGOOPHORECTOMY  1982   W/  APPENDECTOMY   TRANSTHORACIC ECHOCARDIOGRAM  06/24/2012   GRADE I DIASTOLIC DYSFUNCTION/  EF 55-60%/  MILD MR   UPPER GASTROINTESTINAL ENDOSCOPY      Family History  Problem Relation Age of Onset   Rectal cancer Mother    Colon cancer Mother    Pancreatic cancer Mother    Diabetes Mother    Breast cancer Mother 59   Breast cancer Maternal Aunt        breast   Birth defects Maternal Aunt    Irritable bowel syndrome Son    Heart disease Son        CAD, MV replacement   Allergic  Disorder Daughter    Diabetes Daughter    Colon cancer Father    Colon polyps Father    Diabetes Father    Stroke Father    Heart disease Father        CHF   Hyperlipidemia Father    Hypertension Father    Arthritis Father    Breast cancer Paternal Aunt    Breast cancer Paternal Aunt    Arthritis Paternal Uncle    Allergic Disorder Daughter    Heart disease Cousin        CAD, had a blood clot after stenting   Esophageal cancer Neg Hx    Stomach cancer Neg Hx     Social History   Socioeconomic History   Marital status: Married    Spouse name: [name]    Number of children: 3   Years of education: 16   Highest education level: Bachelor's degree (e.g., BA, AB, BS)  Occupational History   Occupation: Retired    Comment: Charity fundraiser  Tobacco Use   Smoking status: Former    Current packs/day: 0.00    Average packs/day: 1 pack/day for 15.0 years (15.0 ttl pk-yrs)    Types: Cigarettes    Start date: 05/27/1990    Quit date: 05/27/2005    Years since quitting: 18.4   Smokeless tobacco: Never  Vaping Use   Vaping status: Never Used  Substance and Sexual Activity   Alcohol use: No    Alcohol/week: 0.0 standard drinks of alcohol   Drug use: No   Sexual activity: Not Currently    Partners: Male    Birth control/protection: Surgical    Comment: 1st intercourse 77 yo-Fewer than 5 partners, hysterectomy  Other Topics Concern   Not on file  Social History Narrative   Lives with husband.   Caffeine use: 1/2 cup per day   Exercise-- 2days a week [employer],  Water aerobics, walking       Retired Engineer, civil (consulting)   No dietary restrictions, tries to maintain a heart healthy diet   Very pleasant lady   Social Determinants of Health   Financial Resource Strain: Low Risk  (10/20/2023)   Overall Financial Resource Strain (CARDIA)    Difficulty of Paying Living Expenses:  Not hard at all  Food Insecurity: No Food Insecurity (10/20/2023)   Hunger Vital Sign    Worried About Running Out of Food in the Last  Year: Never true    Ran Out of Food in the Last Year: Never true  Transportation Needs: No Transportation Needs (10/20/2023)   PRAPARE - Administrator, Civil Service (Medical): No    Lack of Transportation (Non-Medical): No  Physical Activity: Insufficiently Active (10/20/2023)   Exercise Vital Sign    Days of Exercise per Week: 2 days    Minutes of Exercise per Session: 20 min  Stress: Stress Concern Present (10/20/2023)   [employer] of Occupational Health - Occupational Stress Questionnaire    Feeling of Stress : To some extent  Social Connections: Socially Integrated (10/20/2023)   Social Connection and Isolation Panel [NHANES]    Frequency of Communication with Friends and Family: More than three times a week    Frequency of Social Gatherings with Friends and Family: Three times a week    Attends Religious Services: More than 4 times per year    Active Member of Clubs or Organizations: Yes    Attends Banker Meetings: More than 4 times per year    Marital Status: Married  Catering manager Violence: Not At Risk (09/12/2023)   Humiliation, Afraid, Rape, and Kick questionnaire    Fear of Current or Ex-Partner: No    Emotionally Abused: No    Physically Abused: No    Sexually Abused: No    Outpatient Medications Prior to Visit  Medication Sig Dispense Refill   acetaminophen (TYLENOL) 500 MG tablet Take 1,000 mg by mouth every 6 (six) hours as needed for mild pain or headache.      albuterol (VENTOLIN HFA) 108 (90 Base) MCG/ACT inhaler Inhale 2 puffs into the lungs every 4 (four) hours as needed for wheezing or shortness of breath (coughing fits). 18 g 1   aspirin EC 81 MG tablet Take 1 tablet (81 mg total) by mouth daily. 90 tablet 3   atenolol (TENORMIN) 25 MG tablet Take 1 tablet (25 mg total) by mouth 2 (two) times daily. Take 1 tablet by mouth 2 time a day 180 tablet 3   benzonatate (TESSALON PERLES) 100 MG capsule Take 1 capsule (100 mg total) by  mouth 3 (three) times daily as needed for cough. 60 capsule 0   CALCIUM PO Take 500 mg by mouth daily.     carboxymethylcellul-glycerin (OPTIVE) 0.5-0.9 % ophthalmic solution Place 1 drop into both eyes daily as needed for dry eyes.     Cholecalciferol (VITAMIN D3) 50 MCG (2000 UT) CAPS Take 2,000 Units by mouth daily.     cyanocobalamin (VITAMIN B12) 1000 MCG tablet Take 1,000 mg by mouth daily with breakfast.     diclofenac Sodium (VOLTAREN) 1 % GEL Apply 2 g topically daily as needed (Back pain).     Docusate Sodium (DSS) 100 MG CAPS Take 100 mg by mouth daily as needed (Constipation).     DULoxetine (CYMBALTA) 60 MG capsule Take 1 capsule by mouth twice daily 180 capsule 0   EPINEPHrine 0.3 mg/0.3 mL IJ SOAJ injection Inject 0.3 mg into the muscle as needed for anaphylaxis. 2 each 1   famotidine (PEPCID) 20 MG tablet Take 1 tablet (20 mg total) by mouth 2 (two) times daily as needed for heartburn or indigestion. 60 tablet 1   fluticasone (FLONASE) 50 MCG/ACT nasal spray Use 2 spray(s) in each nostril  once daily (Patient taking differently: Place 2 sprays into both nostrils daily as needed for allergies or rhinitis.) 16 g 5   fluticasone (FLOVENT HFA) 110 MCG/ACT inhaler Inhale 2 puffs into the lungs 2 (two) times daily. 1 each 5   folic acid (FOLVITE) 1 MG tablet Take 1 tablet (1 mg total) by mouth daily.     furosemide (LASIX) 20 MG tablet Take 1 tablet (20 mg total) by mouth as needed for edema. 90 tablet 3   gabapentin (NEURONTIN) 300 MG capsule Take 300-900 mg by mouth See admin instructions. Take 600 mg in the morning 300 mg at lunch time and 900 mg at bedtime     hydrOXYzine (ATARAX) 10 MG tablet Take 1 tablet (10 mg total) by mouth 3 (three) times daily as needed for anxiety (not with Loratadine). 90 tablet 2   loratadine (CLARITIN) 10 MG tablet Take 10 mg by mouth daily as needed for rhinitis.     nitroGLYCERIN (NITROSTAT) 0.4 MG SL tablet Place 1 tablet (0.4 mg total) under the tongue  every 5 (five) minutes as needed for chest pain. 25 tablet 1   nystatin cream (MYCOSTATIN) Apply 1 application topically 2 (two) times daily. (Patient taking differently: Apply 1 application  topically daily as needed (vaginal yeast).) 30 g 2   pentosan polysulfate (ELMIRON) 100 MG capsule Take 1 capsule (100 mg total) by mouth 3 (three) times daily. Reported on 01/05/2016 (Patient taking differently: Take 100 mg by mouth daily as needed (burning and pain). Reported on 01/05/2016) 90 capsule 11   polyethylene glycol (MIRALAX) 17 g packet Take 17 g by mouth 2 (two) times daily. 14 each 0   REPATHA SURECLICK 140 MG/ML SOAJ INJECT 140 MG INTO THE SKIN EVERY 14 DAYS 6 mL 0   sucralfate (CARAFATE) 1 GM/10ML suspension Take 10 mLs (1 g total) by mouth 4 (four) times daily -  with meals and at bedtime. 420 mL 1   traMADol (ULTRAM) 50 MG tablet Take 25 mg by mouth 2 (two) times daily.     traZODone (DESYREL) 100 MG tablet TAKE 1 TABLET BY MOUTH AT BEDTIME 90 tablet 0   Vitamin D, Ergocalciferol, (DRISDOL) 1.25 MG (50000 UNIT) CAPS capsule Take 1 capsule (50,000 Units total) by mouth every 7 (seven) days. 12x Weeks 5 capsule 3   No facility-administered medications prior to visit.    Allergies  Allergen Reactions   Clindamycin Hcl Shortness Of Breath and Rash   Penicillins Anaphylaxis   Rosuvastatin Anaphylaxis   Baclofen Other (See Comments) and Rash   Lincomycin Other (See Comments)   Prednisone Rash    Other reaction(s): Rash, Hives - can tolerate if she takes with benadryl    Codeine Hives and Other (See Comments)    headache Other reaction(s): Headache, Nausea, headache   Doxycycline     Skin rash   Erythromycin Hives   Haemophilus Influenzae Other (See Comments)    Local reaction at the site Local reaction at the site   Haemophilus Influenzae Vaccines Other (See Comments)    Local reaction at site   Latex Hives   Levofloxacin     Other reaction(s): sick   Other Other (See Comments)     Father had malignant hyperthermia   Pentazocine Lactate Other (See Comments)    HALLUCINATION   Pneumococcal Vaccine Polyvalent Hives, Swelling and Other (See Comments)    REACTION: redness, swelling, and hives at injection site   Tamoxifen Nausea And Vomiting and Other (See Comments)  HEADACHE Other reaction(s): Headache, Nausea   Fluzone [Influenza Virus Vaccine] Hives    Local reaction at the site    Review of Systems  Constitutional:  Positive for malaise/fatigue. Negative for fever.  HENT:  Negative for congestion.   Eyes:  Negative for blurred vision.  Respiratory:  Negative for shortness of breath.   Cardiovascular:  Negative for chest pain, palpitations and leg swelling.  Gastrointestinal:  Negative for abdominal pain, blood in stool and nausea.  Genitourinary:  Negative for dysuria and frequency.  Musculoskeletal:  Negative for falls.  Skin:  Negative for rash.  Neurological:  Negative for dizziness, loss of consciousness and headaches.  Endo/Heme/Allergies:  Negative for environmental allergies.  Psychiatric/Behavioral:  Negative for depression. The patient is nervous/anxious.        Objective:    Physical Exam Constitutional:      General: She is not in acute distress.    Appearance: Normal appearance. She is well-developed. She is not toxic-appearing.  HENT:     Head: Normocephalic and atraumatic.     Right Ear: External ear normal.     Left Ear: External ear normal.     Nose: Nose normal.  Eyes:     General:        Right eye: No discharge.        Left eye: No discharge.     Conjunctiva/sclera: Conjunctivae normal.  Neck:     Thyroid: No thyromegaly.  Cardiovascular:     Rate and Rhythm: Normal rate and regular rhythm.     Heart sounds: Normal heart sounds. No murmur heard. Pulmonary:     Effort: Pulmonary effort is normal. No respiratory distress.     Breath sounds: Normal breath sounds.  Abdominal:     General: Bowel sounds are normal.      Palpations: Abdomen is soft.     Tenderness: There is no abdominal tenderness. There is no guarding.  Musculoskeletal:        General: Normal range of motion.     Cervical back: Neck supple.  Lymphadenopathy:     Cervical: No cervical adenopathy.  Skin:    General: Skin is warm and dry.  Neurological:     Mental Status: She is alert and oriented to person, place, and time.  Psychiatric:        Mood and Affect: Mood normal.        Behavior: Behavior normal.        Thought Content: Thought content normal.        Judgment: Judgment normal.     BP 128/82 (BP Location: Left Arm, Patient Position: Sitting, Cuff Size: Normal)   Pulse 72   Temp (!) 97.5 F (36.4 C) (Oral)   Resp 16   Ht 5\' 3"  (1.6 m)   Wt 124 lb (56.2 kg)   SpO2 100%   BMI 21.97 kg/m  Wt Readings from Last 3 Encounters:  10/22/23 124 lb (56.2 kg)  09/20/23 121 lb (54.9 kg)  09/12/23 121 lb 1.3 oz (54.9 kg)    Diabetic Foot Exam - Simple   No data filed    Lab Results  Component Value Date   WBC 8.1 08/28/2023   HGB 11.9 (L) 08/28/2023   HCT 38.4 08/28/2023   PLT 363.0 08/28/2023   GLUCOSE 83 08/28/2023   CHOL 131 08/28/2023   TRIG 122.0 08/28/2023   HDL 39.40 08/28/2023   LDLDIRECT 74.0 06/12/2023   LDLCALC 67 08/28/2023   ALT 8 08/28/2023  AST 15 08/28/2023   NA 140 08/28/2023   K 4.7 08/28/2023   CL 101 08/28/2023   CREATININE 1.11 08/28/2023   BUN 13 08/28/2023   CO2 32 08/28/2023   TSH 2.17 08/28/2023   INR 1.05 08/03/2010   HGBA1C 5.5 05/28/2023   MICROALBUR <0.7 08/08/2023    Lab Results  Component Value Date   TSH 2.17 08/28/2023   Lab Results  Component Value Date   WBC 8.1 08/28/2023   HGB 11.9 (L) 08/28/2023   HCT 38.4 08/28/2023   MCV 87.9 08/28/2023   PLT 363.0 08/28/2023   Lab Results  Component Value Date   NA 140 08/28/2023   K 4.7 08/28/2023   CHLORIDE 102 04/20/2015   CO2 32 08/28/2023   GLUCOSE 83 08/28/2023   BUN 13 08/28/2023   CREATININE 1.11  08/28/2023   BILITOT 0.5 08/28/2023   ALKPHOS 118 (H) 08/28/2023   AST 15 08/28/2023   ALT 8 08/28/2023   PROT 6.2 09/04/2023   ALBUMIN 3.6 08/28/2023   CALCIUM 9.2 08/28/2023   ANIONGAP 10 06/14/2023   EGFR 50.0 08/15/2023   GFR 47.73 (L) 08/28/2023   Lab Results  Component Value Date   CHOL 131 08/28/2023   Lab Results  Component Value Date   HDL 39.40 08/28/2023   Lab Results  Component Value Date   LDLCALC 67 08/28/2023   Lab Results  Component Value Date   TRIG 122.0 08/28/2023   Lab Results  Component Value Date   CHOLHDL 3 08/28/2023   Lab Results  Component Value Date   HGBA1C 5.5 05/28/2023       Assessment & Plan:  Extrinsic asthma without complication, unspecified asthma severity, unspecified whether persistent Assessment & Plan: Following with pulmonology, no changes   Chronic renal impairment, unspecified CKD stage Assessment & Plan: Hydrate and monitor    COPD with asthma (HCC) Assessment & Plan: Following with pulmonology Dr byrum   Essential hypertension Assessment & Plan: Well controlled, no changes to meds. Encouraged heart healthy diet such as the DASH diet and exercise as tolerated.     High serum vitamin D Assessment & Plan: monitor   Hyperlipidemia, unspecified hyperlipidemia type Assessment & Plan: Does not tolerate statins. Encourage heart healthy diet such as MIND or DASH diet, increase exercise, avoid trans fats, simple carbohydrates and processed foods, consider a krill or fish or flaxseed oil cap daily.     Osteoporosis, unspecified osteoporosis type, unspecified pathological fracture presence Assessment & Plan: Encouraged to get adequate exercise, calcium and vitamin d intake    Statin intolerance Assessment & Plan: Unable to tolerate statins   Vitamin B12 deficiency Assessment & Plan: Supplement and monitor   Iron deficiency anemia secondary to inadequate dietary iron intake Assessment &  Plan: Continues to follow with Hematology and she is getting iron transfusions   Depression with anxiety Assessment & Plan: Working with behavioral health now, counselor [name] and she is doing much better.   Encounter for screening mammogram for malignant neoplasm of breast -     3D Screening Mammogram, Left and Right; Future    Assessment and Plan    Iron Deficiency Anemia Undergoing iron transfusions. Hemoglobin levels not yet optimal. -Continue with iron transfusions as planned by Eileen Stanford. -Next appointment with Eileen Stanford on 13th October 2024 to assess need for further transfusions.  Endocrine Evaluation Undergoing evaluation by endocrinologist, Dennie Maizes, at [facility]. Blood tests ordered include aldosterone and renin activity. -Obtain notes and lab  results from Dr. Roselle Locus and Dennie Maizes to understand the rationale for these tests.  Neuropsychological Evaluation Completed neuropsychological testing with Dr. Milbert Coulter. No further appointments scheduled with him. -Clarify with Dr. Tammy Sours office if any follow-up appointments were scheduled and cancelled.  Depression Significant improvement noted with counseling sessions with [name]. -Continue weekly counseling sessions with [name].  Hyperkalemia Under evaluation by endocrinologist, Dennie Maizes. -Obtain notes and lab results from Dennie Maizes to understand the current management plan.  General Health Maintenance -Obtain RSV vaccine at walk-in immunization clinic. -Screening mammogram to be scheduled at the breast center in the next few months. -Next physical check-up scheduled for March/April 2025.         Danise Edge, MD

## 2023-10-24 ENCOUNTER — Ambulatory Visit: Payer: Medicare Other | Admitting: Nurse Practitioner

## 2023-10-25 ENCOUNTER — Encounter: Payer: Self-pay | Admitting: Student

## 2023-10-26 ENCOUNTER — Encounter: Payer: Self-pay | Admitting: Professional

## 2023-10-26 ENCOUNTER — Ambulatory Visit (INDEPENDENT_AMBULATORY_CARE_PROVIDER_SITE_OTHER): Payer: Medicare Other | Admitting: Professional

## 2023-10-26 DIAGNOSIS — F411 Generalized anxiety disorder: Secondary | ICD-10-CM | POA: Diagnosis not present

## 2023-10-26 DIAGNOSIS — F331 Major depressive disorder, recurrent, moderate: Secondary | ICD-10-CM | POA: Diagnosis not present

## 2023-10-26 NOTE — Progress Notes (Unsigned)
[facility] Counselor/Therapist Progress Note  Patient ID: [name], MRN: [id number],    Date: 10/26/2023  Time Spent: 33 minutes 103-136pm   Treatment Type: Individual Therapy  Risk Assessment: Danger to Self:  No Self-injurious Behavior: No Danger to Others: No  Subjective: Patient arrived late for her in-person session.  Issues addressed: 1-mood -"my head feels different, it's my body that hurts" -her children have noticed and her oldest daughter said to keep coming to therapy -physical health has been discouraging -physical struggles to drive but admits that once her iron and hemoglobin she will be able to drive again 2-spiritual -patient feels I have been placed in her life to support and assist her 3-physical -not completing her PT as she was to do so -admits that she needs to get her book out and work daily on her exercises  Treatment Plan:   Goals Alleviate depressive symptoms Recognize, accept, and cope with depressive feelings Develop healthy thinking patterns Develop healthy interpersonal relationships (particularly younger daughter) Reduce overall frequency, intensity, and duration of anxiety Stabilize anxiety level while increasing ability to function Enhance ability to effectively cope with full variety of stressors Learn and implement coping skills that result in a reduction of anxiety    Objectives target date for all objectives is 09/26/2024 Verbalize an understanding of the cognitive, physiological, and behavioral components of anxiety Learning and implement calming skills to reduce overall anxiety Verbalize an understanding of the role that cognitive biases play in excessive irrational worry and persistent anxiety symptoms Identify, challenge, and replace biased fearful self-talk Learn and implement problem solving strategies Identify and engage in pleasant activities Learning and implement personal and interpersonal skills to  reduce anxiety and improve interpersonal relationships Learn to accept limitations in life and commit to tolerating, rather than avoiding, unpleasant emotions while accomplishing meaningful goals Identify major life conflicts from the past and present that form the basis for present anxiety Maintain involvement in work, family, and social activities Reestablish a consistent sleep-wake cycle Verbalize an accurate understanding of depression Identify and replace thoughts that support depression Learn and implement behavioral strategies Verbalize an understanding and resolution of current interpersonal problems Learn and implement decision making skills Learn and implement conflict resolution skills to resolve interpersonal problems Verbalize an understanding of healthy and unhealthy emotions verbalize insight into how past relationships may be influence current experiences with depression Use mindfulness and acceptance strategies and increase value based behavior  Increase hopeful statements about the future.  Interventions Engage the patient in behavioral activation Use instruction, modeling, and role-playing to build the patient 's general social, communication, and/or conflict resolution skills Use Acceptance and Commitment Therapy to help patient  accept uncomfortable realities in order to accomplish value-consistent goals Reinforce the patient 's insight into the role of her past emotional pain and present anxiety  Support the patient in following through with work, family, and social activities Teach and implement sleep hygiene practices  Teach the patient relaxation skills Assign the patient homework Discuss examples demonstrating that unrealistic worry overestimates the probability of threats and underestimates patient's ability  Assist the patient in analyzing his or her worries Help patient understand that avoidance is reinforcing  Consistent with treatment model, discuss how change  in cognitive, behavioral, and interpersonal can help patient alleviate depression CBT Behavioral activation to help the patient explore the relationship, nature of the dispute Help the patient develop new interpersonal skills and relationships Conduct Problem solving therapy Teach conflict resolution skills Use a process-experiential approach Conduct  ACT  Diagnosis:Major depressive disorder, recurrent episode, moderate (HCC)  Generalized anxiety disorder  Plan:  -meet on Thursday, November 01, 2023 at 12pm in person.

## 2023-10-29 ENCOUNTER — Other Ambulatory Visit: Payer: Self-pay | Admitting: Family Medicine

## 2023-10-29 ENCOUNTER — Other Ambulatory Visit: Payer: Self-pay | Admitting: Cardiology

## 2023-10-29 ENCOUNTER — Other Ambulatory Visit: Payer: Self-pay | Admitting: Family

## 2023-10-29 DIAGNOSIS — B002 Herpesviral gingivostomatitis and pharyngotonsillitis: Secondary | ICD-10-CM

## 2023-10-29 DIAGNOSIS — R0602 Shortness of breath: Secondary | ICD-10-CM

## 2023-10-30 DIAGNOSIS — Z79891 Long term (current) use of opiate analgesic: Secondary | ICD-10-CM | POA: Diagnosis not present

## 2023-10-30 DIAGNOSIS — M5030 Other cervical disc degeneration, unspecified cervical region: Secondary | ICD-10-CM | POA: Diagnosis not present

## 2023-10-30 DIAGNOSIS — M5416 Radiculopathy, lumbar region: Secondary | ICD-10-CM | POA: Diagnosis not present

## 2023-10-30 DIAGNOSIS — M51361 Other intervertebral disc degeneration, lumbar region with lower extremity pain only: Secondary | ICD-10-CM | POA: Diagnosis not present

## 2023-11-01 ENCOUNTER — Ambulatory Visit: Payer: Medicare Other | Admitting: Professional

## 2023-11-01 ENCOUNTER — Encounter: Payer: Self-pay | Admitting: Professional

## 2023-11-01 DIAGNOSIS — F331 Major depressive disorder, recurrent, moderate: Secondary | ICD-10-CM | POA: Diagnosis not present

## 2023-11-01 DIAGNOSIS — F411 Generalized anxiety disorder: Secondary | ICD-10-CM

## 2023-11-01 NOTE — Progress Notes (Signed)
[facility] Counselor/Therapist Progress Note  Patient ID: [name], MRN: [id number],    Date: 11/01/2023  Time Spent: 57 minutes 1157am-1254pm   Treatment Type: Individual Therapy  Risk Assessment: Danger to Self:  No Self-injurious Behavior: No Danger to Others: No  Subjective: Patient arrived late for her in-person session due to having been sent to the wrong location.  Issues addressed: 1-marital -husband [name] is fantastic -she will make I'm feel bad related to putting him down   -ex: plumbing issue at home [name] handles but not to her satisfaction -how to encourage husband -men are ego based and need to be encouraged -be his wife, don't be his boss -how to not overstep her boundaries -if her husband hasn't asked for feedback don't give it 3-physical -having to have her back issues addressed again -she appears to be having balance issues  Treatment Plan:   Goals Alleviate depressive symptoms Recognize, accept, and cope with depressive feelings Develop healthy thinking patterns Develop healthy interpersonal relationships (particularly younger daughter) Reduce overall frequency, intensity, and duration of anxiety Stabilize anxiety level while increasing ability to function Enhance ability to effectively cope with full variety of stressors Learn and implement coping skills that result in a reduction of anxiety    Objectives target date for all objectives is 09/26/2024 Verbalize an understanding of the cognitive, physiological, and behavioral components of anxiety       60 Learning and implement calming skills to reduce overall anxiety           60 Verbalize an understanding of the role that cognitive biases play in excessive irrational worry and persistent anxiety symptoms   70 Identify, challenge, and replace biased fearful self-talk            60 Learn and implement problem solving strategies             70 Identify and engage in pleasant  activities              80 Learning and implement personal and interpersonal skills to reduce anxiety and improve interpersonal relationships    60 Learn to accept limitations in life and commit to tolerating, rather than avoiding, unpleasant emotions while accomplishing meaningful goals 60 Identify major life conflicts from the past and present that form the basis for present anxiety       70 Maintain involvement in work, family, and social activities           70 Reestablish a consistent sleep-wake cycle             50 Verbalize an accurate understanding of depression            60 Identify and replace thoughts that support depression            50 Learn and implement behavioral strategies Verbalize an understanding and resolution of current interpersonal problems Learn and implement decision making skills Learn and implement conflict resolution skills to resolve interpersonal problems Verbalize an understanding of healthy and unhealthy emotions verbalize insight into how past relationships may be influence current experiences with depression Use mindfulness and acceptance strategies and increase value based behavior  Increase hopeful statements about the future.  Interventions Engage the patient in behavioral activation Use instruction, modeling, and role-playing to build the patient 's general social, communication, and/or conflict resolution skills Use Acceptance and Commitment Therapy to help patient  accept uncomfortable realities in order to accomplish value-consistent goals Reinforce the patient 's insight into the role of her  past emotional pain and present anxiety  Support the patient in following through with work, family, and social activities Teach and implement sleep hygiene practices  Teach the patient relaxation skills Assign the patient homework Discuss examples demonstrating that unrealistic worry overestimates the probability of threats and underestimates patient's ability   Assist the patient in analyzing his or her worries Help patient understand that avoidance is reinforcing  Consistent with treatment model, discuss how change in cognitive, behavioral, and interpersonal can help patient alleviate depression CBT Behavioral activation to help the patient explore the relationship, nature of the dispute Help the patient develop new interpersonal skills and relationships Conduct Problem solving therapy Teach conflict resolution skills Use a process-experiential approach Conduct ACT  Diagnosis:Major depressive disorder, recurrent episode, moderate (HCC)  Generalized anxiety disorder  Plan:  -meet on Friday, November 14, 2023 at 12pm in person.

## 2023-11-05 ENCOUNTER — Emergency Department (HOSPITAL_BASED_OUTPATIENT_CLINIC_OR_DEPARTMENT_OTHER)
Admission: EM | Admit: 2023-11-05 | Discharge: 2023-11-05 | Disposition: A | Discharge status: Home / Self Care | Payer: Medicare Other | Attending: Emergency Medicine | Admitting: Emergency Medicine

## 2023-11-05 ENCOUNTER — Emergency Department (HOSPITAL_BASED_OUTPATIENT_CLINIC_OR_DEPARTMENT_OTHER): Payer: Medicare Other

## 2023-11-05 ENCOUNTER — Encounter (HOSPITAL_BASED_OUTPATIENT_CLINIC_OR_DEPARTMENT_OTHER): Payer: Self-pay

## 2023-11-05 ENCOUNTER — Encounter: Payer: Self-pay | Admitting: Family Medicine

## 2023-11-05 ENCOUNTER — Telehealth: Payer: Self-pay

## 2023-11-05 ENCOUNTER — Ambulatory Visit (INDEPENDENT_AMBULATORY_CARE_PROVIDER_SITE_OTHER): Payer: Medicare Other | Admitting: Family Medicine

## 2023-11-05 ENCOUNTER — Other Ambulatory Visit: Payer: Self-pay

## 2023-11-05 VITALS — BP 156/78 | HR 70 | Ht 63.0 in | Wt 124.0 lb

## 2023-11-05 DIAGNOSIS — Z91040 Latex allergy status: Secondary | ICD-10-CM | POA: Diagnosis not present

## 2023-11-05 DIAGNOSIS — R0789 Other chest pain: Secondary | ICD-10-CM | POA: Diagnosis not present

## 2023-11-05 DIAGNOSIS — J449 Chronic obstructive pulmonary disease, unspecified: Secondary | ICD-10-CM | POA: Diagnosis not present

## 2023-11-05 DIAGNOSIS — Z7982 Long term (current) use of aspirin: Secondary | ICD-10-CM | POA: Insufficient documentation

## 2023-11-05 DIAGNOSIS — R079 Chest pain, unspecified: Secondary | ICD-10-CM

## 2023-11-05 DIAGNOSIS — I2510 Atherosclerotic heart disease of native coronary artery without angina pectoris: Secondary | ICD-10-CM | POA: Diagnosis not present

## 2023-11-05 DIAGNOSIS — I10 Essential (primary) hypertension: Secondary | ICD-10-CM | POA: Insufficient documentation

## 2023-11-05 LAB — CBC
HCT: 41.9 % (ref 36.0–46.0)
Hemoglobin: 13.3 g/dL (ref 12.0–15.0)
MCH: 28.8 pg (ref 26.0–34.0)
MCHC: 31.7 g/dL (ref 30.0–36.0)
MCV: 90.7 fL (ref 80.0–100.0)
Platelets: 273 10*3/uL (ref 150–400)
RBC: 4.62 MIL/uL (ref 3.87–5.11)
RDW: 16.5 % — ABNORMAL HIGH (ref 11.5–15.5)
WBC: 8.7 10*3/uL (ref 4.0–10.5)
nRBC: 0 % (ref 0.0–0.2)

## 2023-11-05 LAB — TROPONIN I (HIGH SENSITIVITY): Troponin I (High Sensitivity): 12 ng/L (ref <18)

## 2023-11-05 LAB — BASIC METABOLIC PANEL
Anion gap: 8 (ref 5–15)
BUN: 13 mg/dL (ref 8–23)
CO2: 30 mmol/L (ref 22–32)
Calcium: 8.8 mg/dL — ABNORMAL LOW (ref 8.9–10.3)
Chloride: 100 mmol/L (ref 98–111)
Creatinine, Ser: 0.95 mg/dL (ref 0.44–1.00)
GFR, Estimated: >60 mL/min (ref >60)
Glucose, Bld: 99 mg/dL (ref 70–99)
Potassium: 4.2 mmol/L (ref 3.5–5.1)
Sodium: 138 mmol/L (ref 135–145)

## 2023-11-05 LAB — EKG 12-LEAD

## 2023-11-05 MED ORDER — LIDOCAINE 5 % EX PTCH: 1.0000 | MEDICATED_PATCH | CUTANEOUS | 0 refills | Status: DC

## 2023-11-05 NOTE — Progress Notes (Signed)
Acute Office Visit  Subjective:     Patient ID: [name], female    DOB: [birth year], 77 y.o.   MRN: [id number]  Chief Complaint  Patient presents with   Chest Pain     Patient is in today for chest pain.   Discussed the use of AI scribe software for clinical note transcription with the patient, who gave verbal consent to proceed.  History of Present Illness   The patient presents with a chief complaint of chest discomfort that started on Friday afternoon. She describes the sensation as a burning tightness in the chest, which she initially attributed to her known GERD. Despite taking her usual medications for GERD, the discomfort persisted, leading her to take an additional Protonix, which provided some relief. However, later that night, the patient began experiencing pain in the back, specifically under the left shoulder blade, which radiated up and down the arm. She also noticed a sore spot under her left arm, axillary region.  The patient also reports feeling generally unwell, with persistent nausea and a headache that she can't seem to shake. She also experienced episodes of sweating and clamminess during the night, which woke her up associated with the chest discomfort. The patient describes the chest pain as pressure-like, radiating from the chest through to the back. She also reports some shortness of breath and a sensation of heat in the left arm.  The patient has been dealing with ongoing fatigue, which she attributes to low hemoglobin and iron levels. She reports that this fatigue has worsened recently, to the point where she struggles to perform basic tasks such as showering and dressing. The patient drove herself to the clinic, during which she experienced chest discomfort and dizziness. She also reports feeling dizzy and short of breath when walking from the parking lot to the clinic.  She also mentions a sore spot under her arm and enlarged lymph nodes. She has not noticed  any changes in her breasts or any rashes. The patient has been feeling so unwell that she has considered giving up, but she is determined to keep going for the sake of her husband.             All review of systems negative except what is listed in the HPI      Objective:    BP (!) 156/78   Pulse 70   Ht 5\' 3"  (1.6 m)   Wt 124 lb (56.2 kg)   SpO2 100%   BMI 21.97 kg/m    Physical Exam Constitutional:      Appearance: Normal appearance.  Musculoskeletal:     Comments: Left axillary tenderness   Skin:    General: Skin is warm and dry.  Neurological:     Mental Status: She is alert and oriented to person, place, and time.  Psychiatric:        Mood and Affect: Mood normal.        Behavior: Behavior normal.        Thought Content: Thought content normal.        Judgment: Judgment normal.     Results for orders placed or performed in visit on 11/05/23  EKG 12-Lead  Result Value Ref Range   EKG 12 lead          Assessment & Plan:   Problem List Items Addressed This Visit   None Visit Diagnoses     Chest pain, unspecified type    -  Primary  Relevant Orders   EKG 12-Lead (Completed)     EKG: atrial rhythm 69 bpm, LAE, nonspecific T wave changes Patient is quite symptomatic on exam and has EKG changes compared to baseline Recommend she go to the ED for quicker workup - she is agreeable. Escorted via wheelchair by CMA downstairs to [facility] ED.  No orders of the defined types were placed in this encounter.   Return if symptoms worsen or fail to improve.  Clayborne Dana, NP

## 2023-11-05 NOTE — Telephone Encounter (Signed)
Pt called back and said "She had a good nights sleep and is feeling better" I offered to schedule the 24 hour follow up appointment with Kathrene Alu the triage note,but patient decided not to make the appointment at this time

## 2023-11-05 NOTE — ED Provider Notes (Signed)
[place] [facility] AT [facility] Provider Note   CSN: [id number] Arrival date & time: 11/05/23  1535     History  Chief Complaint  Patient presents with   Chest Pain    [name] is a 77 y.o. female with a past medical history of hypertension, coronary artery disease, hyperlipidemia, COPD, fibromyalgia presents emergency department for evaluation of chest pain that first presented Friday evening.  She stated that she recently thought it was GERD and she took a PPI which did resolve the majority of the chest pain however when she woke up Friday night she had left-sided chest pain that was sharp and sudden and radiated to her left axilla.  She does report new shortness of breath that is exertional.  She denied exertional chest pain, recent increase in activity, trauma to the chest wall, recent URI, surgery, cancer within the past 6 months, travel, hemoptysis, and lower extremity edema.       Home Medications Prior to Admission medications   Medication Sig Start Date End Date Taking? Authorizing Provider  lidocaine (LIDODERM) 5 % Place 1 patch onto the skin daily. Remove & Discard patch within 12 hours or as directed by MD 11/05/23  Yes Faith Rogue, DO  acetaminophen (TYLENOL) 500 MG tablet Take 1,000 mg by mouth every 6 (six) hours as needed for mild pain or headache.     [provider]  albuterol (VENTOLIN HFA) 108 (90 Base) MCG/ACT inhaler Inhale 2 puffs into the lungs every 4 (four) hours as needed for wheezing or shortness of breath (coughing fits). 05/08/23   Ellamae Sia, DO  aspirin EC 81 MG tablet Take 1 tablet (81 mg total) by mouth daily. 05/28/14   Kathleene Hazel, MD  atenolol (TENORMIN) 25 MG tablet Take 1 tablet (25 mg total) by mouth 2 (two) times daily. Take 1 tablet by mouth 2 time a day 05/02/23   Lewayne Bunting, MD  benzonatate (TESSALON PERLES) 100 MG capsule Take 1 capsule (100 mg total) by mouth 3 (three) times daily as  needed for cough. 09/20/23   Bradd Canary, MD  CALCIUM PO Take 500 mg by mouth daily.    [provider]  carboxymethylcellul-glycerin (OPTIVE) 0.5-0.9 % ophthalmic solution Place 1 drop into both eyes daily as needed for dry eyes.    [provider]  Cholecalciferol (VITAMIN D3) 50 MCG (2000 UT) CAPS Take 2,000 Units by mouth daily.    [provider]  cyanocobalamin (VITAMIN B12) 1000 MCG tablet Take 1,000 mg by mouth daily with breakfast.    [provider]  diclofenac Sodium (VOLTAREN) 1 % GEL Apply 2 g topically daily as needed (Back pain). 02/25/15   [provider]  Docusate Sodium (DSS) 100 MG CAPS Take 100 mg by mouth daily as needed (Constipation).    [provider]  DULoxetine (CYMBALTA) 60 MG capsule Take 1 capsule by mouth twice daily 08/28/23   Bradd Canary, MD  EPINEPHrine 0.3 mg/0.3 mL IJ SOAJ injection Inject 0.3 mg into the muscle as needed for anaphylaxis. 05/08/23   Ellamae Sia, DO  famotidine (PEPCID) 20 MG tablet Take 1 tablet (20 mg total) by mouth 2 (two) times daily as needed for heartburn or indigestion. 04/25/23   Bradd Canary, MD  fluticasone (FLONASE) 50 MCG/ACT nasal spray Use 2 spray(s) in each nostril once daily Patient taking differently: Place 2 sprays into both nostrils daily as needed for allergies or rhinitis.  11/15/20   Bradd Canary, MD  fluticasone (FLOVENT HFA) 110 MCG/ACT inhaler Inhale 2 puffs into the lungs 2 (two) times daily. 08/28/23   Bradd Canary, MD  folic acid (FOLVITE) 1 MG tablet Take 1 tablet (1 mg total) by mouth daily. 05/03/23   Sandford Craze, NP  furosemide (LASIX) 20 MG tablet TAKE 1 TABLET BY MOUTH AS NEEDED FOR EDEMA 10/29/23   Lewayne Bunting, MD  gabapentin (NEURONTIN) 300 MG capsule Take 300-900 mg by mouth See admin instructions. Take 600 mg in the morning 300 mg at lunch time and 900 mg at bedtime 09/28/22   Bradd Canary, MD  hydrOXYzine (ATARAX) 10 MG tablet Take 1  tablet (10 mg total) by mouth 3 (three) times daily as needed for anxiety (not with Loratadine). 09/20/23   Bradd Canary, MD  loratadine (CLARITIN) 10 MG tablet Take 10 mg by mouth daily as needed for rhinitis.    [provider]  nitroGLYCERIN (NITROSTAT) 0.4 MG SL tablet Place 1 tablet (0.4 mg total) under the tongue every 5 (five) minutes x 3 doses as needed for chest pain. 10/29/23   Bradd Canary, MD  nystatin cream (MYCOSTATIN) Apply 1 application topically 2 (two) times daily. Patient taking differently: Apply 1 application  topically daily as needed (vaginal yeast). 05/17/21   Bradd Canary, MD  pentosan polysulfate (ELMIRON) 100 MG capsule Take 1 capsule (100 mg total) by mouth 3 (three) times daily. Reported on 01/05/2016 Patient taking differently: Take 100 mg by mouth daily as needed (burning and pain). Reported on 01/05/2016 02/01/17   Ok Edwards, MD  polyethylene glycol (MIRALAX) 17 g packet Take 17 g by mouth 2 (two) times daily. 05/22/23   Benancio Deeds, MD  REPATHA SURECLICK 140 MG/ML SOAJ INJECT 140 MG INTO THE SKIN EVERY 14 DAYS 04/26/23   Bradd Canary, MD  sucralfate (CARAFATE) 1 GM/10ML suspension Take 10 mLs (1 g total) by mouth 4 (four) times daily -  with meals and at bedtime. 08/21/22   Donato Schultz, DO  traMADol (ULTRAM) 50 MG tablet Take 25 mg by mouth 2 (two) times daily. 10/12/22   [provider]  traZODone (DESYREL) 100 MG tablet TAKE 1 TABLET BY MOUTH AT BEDTIME 08/28/23   Bradd Canary, MD  Vitamin D, Ergocalciferol, (DRISDOL) 1.25 MG (50000 UNIT) CAPS capsule Take 1 capsule (50,000 Units total) by mouth every 7 (seven) days. 12x Weeks 01/23/23   Bradd Canary, MD      Allergies    Clindamycin hcl, Penicillins, Rosuvastatin, Baclofen, Lincomycin, Prednisone, Codeine, Doxycycline, Erythromycin, Haemophilus influenzae, Haemophilus influenzae vaccines, Latex, Levofloxacin, Other, Pentazocine lactate, Pneumococcal vaccine  polyvalent, Tamoxifen, and Fluzone [influenza virus vaccine]    Review of Systems   Review of Systems  Respiratory:  Positive for shortness of breath.   Cardiovascular:  Positive for chest pain. Negative for leg swelling.  Gastrointestinal:  Positive for abdominal pain (Left upper quadrant). Negative for blood in stool, nausea and vomiting.    Physical Exam Updated Vital Signs BP (!) 159/97 (BP Location: Left Arm)   Pulse 69   Temp 98.4 F (36.9 C)   Resp 20   Ht 5\' 3"  (1.6 m)   Wt 56.2 kg   SpO2 97%   BMI 21.97 kg/m  Physical Exam Vitals and nursing note reviewed.  Constitutional:      General: She is not in acute distress.    Appearance: She is well-developed. She  is not ill-appearing, toxic-appearing or diaphoretic.  HENT:     Head: Normocephalic.  Eyes:     General: No scleral icterus. Cardiovascular:     Rate and Rhythm: Normal rate and regular rhythm.     Pulses:          Radial pulses are 2+ on the right side and 2+ on the left side.     Heart sounds: Normal heart sounds.  Pulmonary:     Effort: Pulmonary effort is normal. No tachypnea or respiratory distress.     Breath sounds: Normal breath sounds.  Chest:     Chest wall: Tenderness (Reproducible chest pain left chest wall and left axilla) present.  Abdominal:     Palpations: Abdomen is soft.     Tenderness: There is abdominal tenderness (Left upper quadrant).  Musculoskeletal:     Right lower leg: No edema.     Left lower leg: No edema.  Skin:    General: Skin is warm and dry.  Neurological:     Mental Status: She is alert.  Psychiatric:        Mood and Affect: Mood normal.     ED Results / Procedures / Treatments   Labs (all labs ordered are listed, but only abnormal results are displayed) Labs Reviewed  BASIC METABOLIC PANEL - Abnormal; Notable for the following components:      Result Value   Calcium 8.8 (*)    All other components within normal limits  CBC - Abnormal; Notable for the  following components:   RDW 16.5 (*)    All other components within normal limits  TROPONIN I (HIGH SENSITIVITY)    EKG None  Radiology No results found.  Procedures Procedures    Medications Ordered in ED Medications - No data to display  ED Course/ Medical Decision Making/ A&P                                 Medical Decision Making Patient is a 77 year old female with a cardiac history of CAD, hypertension, hyperlipidemia and a positive family history of cardiac disease who presented for evaluation of chest pain and exertional shortness of breath.  Vital signs upon arrival are pulse rate of 69, blood pressure 159/97, respiratory rate of 20 with an SpO2 of 97%.  Physical exam was remarkable for reproducible left-sided chest wall pain and reproducible left axillary pain.  She did not have evidence of lower extremity edema or tenderness to palpation.  EKG did not show significant ST segment changes, initial troponin 12.  Etiology of her chest pain is most likely musculoskeletal, the chest pain is reproducible she has a history of fibromyalgia plus PMR.  Other [place] etiologies considered are ACS however this is unlikely because her EKG does not have significant changes and her troponin is 12.  Her heart score is 4, we were not repeat a troponin due to chest pain pain being present for more than 24 hours.  PE Wells score is 0  She was discharged home with instructions to follow-up with cardiologist, PCP, to increase gabapentin 100 mg, and with a prescription for lidocaine patches.  Patient appeared well at time of discharge.   Amount and/or Complexity of Data Reviewed Labs: ordered.    Details: CBC: No anemia BMP: Creatinine 0.95  Radiology: ordered. ECG/medicine tests: ordered and independent interpretation performed.    Details: Normal sinus rhythm without evidence of abnormal ST segment  changes           Final Clinical Impression(s) / ED Diagnoses Final diagnoses:   Atypical chest pain  Chest wall pain  CAD in native artery    Rx / DC Orders ED Discharge Orders          Ordered    lidocaine (LIDODERM) 5 %  Every 24 hours        11/05/23 1702    Ambulatory referral to Cardiology       Comments: Follow up with established  cardiologist   11/05/23 1704              Faith Rogue, DO 11/05/23 1712    Charlynne Pander, MD 11/06/23 1451

## 2023-11-05 NOTE — Telephone Encounter (Signed)
Called to follow up with patient and make a follow up appointment. No answer. Voicemail message was left

## 2023-11-05 NOTE — Telephone Encounter (Signed)
Initial Comment Caller states she has all the pains. She woke up and under her left arm it feels like her lymph nodes are swollen and hurt. She has had cancer. Her hemoglobin is 4. Translation No Nurse Assessment Nurse: Para March, RN, Jasmine Date/Time (Eastern Time): 11/03/2023 10:14:55 AM Confirm and document reason for call. If symptomatic, describe symptoms. ---Caller states that her hgb is low, and she is suppose to have a iron infusion on Wednesday. States that she is very lethargic. States yesterday was really bad. States last night she would get pain under her left arm , feels like she lymph node is swollen, and can feel it and it hurts very bad. Caller states concern for a her face/sinuses as she hit her face with the microwave door and she ended up with a black eye on the right side. Does the patient have any new or worsening symptoms? ---Yes Will a triage be completed? ---Yes Related visit to physician within the last 2 weeks? ---No Does the PT have any chronic conditions? (i.e. diabetes, asthma, this includes High risk factors for pregnancy, etc.) ---Yes List chronic conditions. ---HTN, Gabapentin, chronic back pain Is this a behavioral health or substance abuse call? ---No Guidelines Guideline Title Affirmed Question Affirmed Notes Nurse Date/Time Lamount Cohen Time) Lymph Nodes - Swollen [1] Single large node AND [2] size > 1 Para March, RN, [place] 11/03/2023 10:15:52 AM PLEASE NOTE: All timestamps contained within this report are represented as [place] Standard Time. CONFIDENTIALTY NOTICE: This fax transmission is intended only for the addressee. It contains information that is legally privileged, confidential or otherwise protected from use or disclosure. If you are not the intended recipient, you are strictly prohibited from reviewing, disclosing, copying using or disseminating any of this information or taking any action in reliance on or regarding this information. If  you have received this fax in error, please notify us immediately by telephone so that we can arrange for its return to [place]. Phone: [phone], Toll-Free: [phone], Fax: [phone] Page: 2 of 2 Call Id: [id number] Guidelines Guideline Title Affirmed Question Affirmed Notes Nurse Date/Time Lamount Cohen Time) inch (2.5 cm) AND [3] no fever Disp. Time Lamount Cohen Time) Disposition Final User 11/03/2023 10:38:51 AM See PCP within 24 Hours Yes Para March, RN, [place] Final Disposition 11/03/2023 10:38:51 AM See PCP within 24 Hours Yes Para March, RN, [place]avy Cella Caller Disagree/Comply Comply Caller Understands Yes PreDisposition Call Doctor Care Advice Given Per Guideline * IF OFFICE WILL BE CLOSED: You need to be seen within the next 24 hours. A clinic or an urgent care center is often a good source of care if your doctor's office is closed or you can't get an appointment. * You become worse * Consider both the urgency of the patient's symptoms AND what resources may be needed to evaluate and manage the patient. * Use nurse judgment to select the most appropriate source of care. CALL BACK IF: Comments User: Charisse March, RN Date/Time Lamount Cohen Time): 11/03/2023 10:27:48 AM tramadol and trazadone atenolol Referrals [facility] at [facility] - UC

## 2023-11-05 NOTE — Discharge Instructions (Signed)
You came to the emergency department for evaluation of chest pain and shortness of breath.  The chest pain is most likely caused from your ribs and muscles.  You do not have evidence of a heart attack at this time.  We instructed you to increase your gabapentin by 100 mg.  And we will prescribe you lidocaine patches to apply to the area of the pain.  Please follow-up with your PCP in 1 to 2 weeks and with your cardiologist in 2 to 4 weeks.  If your chest pain comes more persistent or worsens, please return.

## 2023-11-05 NOTE — ED Triage Notes (Signed)
Pt reports central chest pain that started on Friday. She reports she also had an episode of pain that started in her left chest that radiated into her back. She was feeling better but now feels pain in her left arm and feels short of breath.

## 2023-11-05 NOTE — ED Notes (Signed)
Reviewed discharge instructions and follow up recommendations with pt. Pt states understanding. Ambulatory at discharge

## 2023-11-07 ENCOUNTER — Inpatient Hospital Stay: Payer: Medicare Other | Admitting: Medical Oncology

## 2023-11-07 ENCOUNTER — Inpatient Hospital Stay: Payer: Medicare Other | Attending: Hematology & Oncology

## 2023-11-07 ENCOUNTER — Other Ambulatory Visit: Payer: Self-pay

## 2023-11-07 ENCOUNTER — Inpatient Hospital Stay: Payer: Medicare Other

## 2023-11-07 ENCOUNTER — Telehealth: Payer: Self-pay | Admitting: *Deleted

## 2023-11-07 ENCOUNTER — Encounter: Payer: Self-pay | Admitting: Medical Oncology

## 2023-11-07 VITALS — BP 160/70 | HR 72 | Temp 98.2°F | Resp 18 | Ht 63.0 in | Wt 124.1 lb

## 2023-11-07 DIAGNOSIS — D508 Other iron deficiency anemias: Secondary | ICD-10-CM

## 2023-11-07 DIAGNOSIS — K909 Intestinal malabsorption, unspecified: Secondary | ICD-10-CM | POA: Insufficient documentation

## 2023-11-07 DIAGNOSIS — E538 Deficiency of other specified B group vitamins: Secondary | ICD-10-CM | POA: Insufficient documentation

## 2023-11-07 DIAGNOSIS — R7989 Other specified abnormal findings of blood chemistry: Secondary | ICD-10-CM

## 2023-11-07 LAB — CMP (CANCER CENTER ONLY)
ALT: 9 U/L (ref 0–44)
AST: 20 U/L (ref 15–41)
Albumin: 3.8 g/dL (ref 3.5–5.0)
Alkaline Phosphatase: 100 U/L (ref 38–126)
Anion gap: 9 (ref 5–15)
BUN: 13 mg/dL (ref 8–23)
CO2: 29 mmol/L (ref 22–32)
Calcium: 8.7 mg/dL — ABNORMAL LOW (ref 8.9–10.3)
Chloride: 101 mmol/L (ref 98–111)
Creatinine: 1.05 mg/dL — ABNORMAL HIGH (ref 0.44–1.00)
GFR, Estimated: 54 mL/min — ABNORMAL LOW (ref >60)
Glucose, Bld: 113 mg/dL — ABNORMAL HIGH (ref 70–99)
Potassium: 3.7 mmol/L (ref 3.5–5.1)
Sodium: 139 mmol/L (ref 135–145)
Total Bilirubin: 0.6 mg/dL (ref <1.2)
Total Protein: 6.9 g/dL (ref 6.5–8.1)

## 2023-11-07 LAB — CBC WITH DIFFERENTIAL (CANCER CENTER ONLY)
Abs Immature Granulocytes: 0.06 10*3/uL (ref 0.00–0.07)
Basophils Absolute: 0.1 10*3/uL (ref 0.0–0.1)
Basophils Relative: 1 %
Eosinophils Absolute: 0.3 10*3/uL (ref 0.0–0.5)
Eosinophils Relative: 4 %
HCT: 43.2 % (ref 36.0–46.0)
Hemoglobin: 13.5 g/dL (ref 12.0–15.0)
Immature Granulocytes: 1 %
Lymphocytes Relative: 15 %
Lymphs Abs: 1.2 10*3/uL (ref 0.7–4.0)
MCH: 29.1 pg (ref 26.0–34.0)
MCHC: 31.3 g/dL (ref 30.0–36.0)
MCV: 93.1 fL (ref 80.0–100.0)
Monocytes Absolute: 0.6 10*3/uL (ref 0.1–1.0)
Monocytes Relative: 8 %
Neutro Abs: 5.5 10*3/uL (ref 1.7–7.7)
Neutrophils Relative %: 71 %
Platelet Count: 252 10*3/uL (ref 150–400)
RBC: 4.64 MIL/uL (ref 3.87–5.11)
RDW: 16.4 % — ABNORMAL HIGH (ref 11.5–15.5)
WBC Count: 7.8 10*3/uL (ref 4.0–10.5)
nRBC: 0 % (ref 0.0–0.2)

## 2023-11-07 LAB — IRON AND IRON BINDING CAPACITY (CC-WL,HP ONLY)
Iron: 94 ug/dL (ref 28–170)
Saturation Ratios: 43 % — ABNORMAL HIGH (ref 10.4–31.8)
TIBC: 217 ug/dL — ABNORMAL LOW (ref 250–450)
UIBC: 123 ug/dL — ABNORMAL LOW (ref 148–442)

## 2023-11-07 LAB — RETICULOCYTES
Immature Retic Fract: 17.3 % — ABNORMAL HIGH (ref 2.3–15.9)
RBC.: 4.61 MIL/uL (ref 3.87–5.11)
Retic Count, Absolute: 77.4 10*3/uL (ref 19.0–186.0)
Retic Ct Pct: 1.7 % (ref 0.4–3.1)

## 2023-11-07 LAB — FERRITIN: Ferritin: 336 ng/mL — ABNORMAL HIGH (ref 11–307)

## 2023-11-07 NOTE — Transitions of Care (Post Inpatient/ED Visit) (Signed)
   11/07/2023  Name: [name] MRN: [id number] DOB: [birth year]  Today's TOC FU Call Status: Today's TOC FU Call Status:: Successful TOC FU Call Completed TOC FU Call Complete Date: 11/07/23 Patient's Name and Date of Birth confirmed.  Transition Care Management Follow-up Telephone Call Date of Discharge: 11/05/23 Discharge Facility: [facility] Type of Discharge: Emergency Department Reason for ED Visit: Other: How have you been since you were released from the hospital?: Same Any questions or concerns?: No  Items Reviewed: Did you receive and understand the discharge instructions provided?: No Medications obtained,verified, and reconciled?: Yes (Medications Reviewed) Any new allergies since your discharge?: No Dietary orders reviewed?: No Do you have support at home?: Yes People in Home: spouse  Medications Reviewed Today: Medications Reviewed Today   Medications were not reviewed in this encounter     Home Care and Equipment/Supplies: Were Home Health Services Ordered?: NA Any new equipment or medical supplies ordered?: NA  Functional Questionnaire: Do you need assistance with bathing/showering or dressing?: No Do you need assistance with meal preparation?: No Do you need assistance with eating?: No Do you have difficulty maintaining continence: No Do you need assistance with getting out of bed/getting out of a chair/moving?: No Do you have difficulty managing or taking your medications?: No  Follow up appointments reviewed: PCP Follow-up appointment confirmed?: Yes Date of PCP follow-up appointment?: 11/27/23 Follow-up Provider: Dr. Reuel Derby Specialist [facility] Follow-up appointment confirmed?: No Reason Specialist Follow-Up Not Confirmed: Patient has Specialist Provider Number and will Call for Appointment Do you need transportation to your follow-up appointment?: No Do you understand care options if your condition(s) worsen?: Yes-patient  verbalized understanding    Donne Anon, CMA

## 2023-11-07 NOTE — Progress Notes (Signed)
[facility] at [facility] Telephone:([phone]) [phone] Fax:(336) [phone]  Patient Care Team: Bradd Canary, MD as PCP - General (Family Medicine) Jens Som Madolyn Frieze, MD as PCP - Cardiology (Cardiology) Cherlyn Roberts, MD as Consulting Physician (Dermatology) Delton Coombes Les Pou, MD as Consulting Physician (Pulmonary Disease) Magrinat, Valentino Hue, MD (Inactive) as Consulting Physician (Oncology) Jens Som Madolyn Frieze, MD as Consulting Physician (Cardiology) Armbruster, Willaim Rayas, MD as Consulting Physician (Gastroenterology) Sheran Luz, MD as Consulting Physician (Physical Medicine and Rehabilitation) Fontaine, Nadyne Coombes, MD (Inactive) as Consulting Physician (Gynecology) Osborn Coho, MD (Inactive) as Consulting Physician (Otolaryngology) Durene Romans, MD as Consulting Physician (Orthopedic Surgery) Ihor Gully, MD (Inactive) as Consulting Physician (Urology) Rossie Muskrat, MD (Rheumatology)   Name of the patient: [name]  [id number]  Jul 11, 1946   Oncological History: IDA secondary to inadequate dietary intake Low B12 level  Current Treatment:  IV iron- Feraheme- last treatment 10/04/2023-oral iron ineffective   Previous Work Up:  EGD and colonoscopy in 02/2022. EGD showed a 1 cm hiatal hernia. Fecal Occult testing 07/2023 negative  Date of visit: 11/07/23  Reason for Consult: [name] is a 77 y.o. female who presents today for follow up of her IDA. She received 2 treatments of IV Feraheme on 09/27/2023 and 10/04/2023.   She reports that she tolerated treatment well.  Since this time she has not noticed improvement in her symptoms.  There has been no bleeding to her knowledge: denies epistaxis, gingivitis, hemoptysis, hematemesis, hematuria, melena, excessive bruising, blood donation.   Denies any neurologic complaints. Denies recent fevers or illnesses. Denies any easy bleeding or bruising. Reports good appetite and denies weight loss.  Denies chest pain. Denies any nausea, vomiting, constipation, or diarrhea. Denies urinary complaints. Patient offers no further specific complaints today. Wt Readings from Last 3 Encounters:  11/07/23 124 lb 1.3 oz (56.3 kg)  11/05/23 124 lb (56.2 kg)  11/05/23 124 lb (56.2 kg)   PAST MEDICAL HISTORY: Past Medical History:  Diagnosis Date   Abdominal pain 10/26/2013   Allergic rhinitis 01/25/2016   ANA positive 07/29/2013   Anemia    Arthralgia 08/18/2013   Arthritis    Asthma, allergic 08/05/2012   Atrial flutter    past history- not current   Atypical chest pain 01/01/2015   Bilateral carpal tunnel syndrome 03/04/2015   CAD in native artery 08/25/2010   Minor CAD 2009, 2011, low risk Myoview 2013 and 2016        Cataract    bilaterally removed    Cellulitis of breast 02/05/2023   Chronic constipation    Chronic cough 01/09/2017   Chronic interstitial cystitis 02/01/2017   Chronic night sweats 01/08/2015   Common bile duct dilatation 02/13/2023   Concussion    x 3   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   DDD (degenerative disc disease), cervical 09/17/2018   Degenerative scoliosis 04/22/2018   Deviated septum 05/12/2016   Dysphagia    Dyspnea    Dysuria 03/09/2021   Elevated alkaline phosphatase level 01/25/2023   Essential hypertension 08/25/2010   External hemorrhoids 02/05/2015   Facial trauma 01/22/2023   Family history of adverse reaction to anesthesia    Family history of malignant hyperthermia    father had this   Fibrocystic breast disease 10/18/2010   Fibromyalgia    Frequency of urination    Generalized anxiety disorder 08/03/2010   GERD (gastroesophageal reflux disease)    Headache 10/13/2013   High serum vitamin D 05/18/2021  History of chronic bronchitis    History of colonic polyp 02/25/2019   History of hiatal hernia    History of tachycardia    CONTROLLED  WITH ATENOLOL   Hyperglycemia 03/08/2021   Hyperkalemia 09/28/2022   Hyperlipidemia     Intertrigo 12/13/2022   Kidney stone 02/26/2019   Knee pain, right 04/30/2015   Left hip pain 04/30/2015   Leg pain 02/24/2011   Qualifier: Diagnosis of   By: Laury Axon DOMyrene Buddy         Leukocytosis 07/05/2022   Local reaction to hymenoptera sting 05/09/2023   Low back pain 09/08/2020   Major depressive disorder 02/05/2013   Malignant neoplasm of lower-outer quadrant of right breast of female, estrogen receptor positive 01/03/2013   Nasal turbinate hypertrophy 05/12/2016   Oral herpes 02/05/2023   OSA and COPD overlap syndrome 06/10/2020   Osteoporosis 01/2019   T score -2.2 stable/improved from prior study   Pelvic pain    Peripheral neuropathy 05/22/2014   Personal history of radiation therapy    Pneumonia    Primary insomnia 06/25/2017   Pulmonary nodules 02/12/2015   Rash and other nonspecific skin eruption 05/09/2023   Recurrent falls 05/02/2020   Recurrent sinusitis 02/25/2019   Right arm pain 07/20/2016   RLS (restless legs syndrome) 11/09/2019   RUQ pain 07/05/2022   S/P radiation therapy 11/12/12 - 12/05/12   right Breast   Sciatica 04/30/2015   Sepsis 2014   from UTI    Sjogren's disease 12/12/2021   Splenic lesion 09/02/2012   Sprain of lateral ligament of ankle joint 07/31/2023   Stage 3 chronic kidney disease    Statin intolerance 02/29/2020   Stricture and stenosis of esophagus 10/19/2011   Tongue lesion 02/05/2023   Tremor 07/05/2022   Urgency of urination    Vertigo 01/22/2023   Vitamin B12 deficiency 03/08/2021   Vocal cord cyst 05/18/2021    PAST SURGICAL HISTORY:  Past Surgical History:  Procedure Laterality Date   48 HOUR PH STUDY N/A 04/03/2016   Procedure: 24 HOUR PH STUDY;  Surgeon: Ruffin Frederick, MD;  Location: [facility];  Service: Gastroenterology;  Laterality: N/A;   BREAST BIOPSY Right 08/23/2012   ADH   BREAST BIOPSY Right 10/11/2012   Ductal Carcinoma   BREAST EXCISIONAL BIOPSY Right 09/04/2013   benign   BREAST  LUMPECTOMY Right 10/11/2012   W/ [facility] BX   CARDIAC CATHETERIZATION  09-13-2007  DR Riley Kill   WELL-PRESERVED LVF/  DIFFUSE SCATTERED CORONARY CALCIFACATION AND ATHEROSCLEROSIS WITHOUT OBSTRUCTION   CARDIAC CATHETERIZATION  08-04-2010  DR MCALHANY   NON-OBSTRUCTIVE CAD/  pLAD 40%/  oLAD 30%/  mLAD 30%/  pRCA 30%/  EF 60%   CARDIOVASCULAR STRESS TEST  06-18-2012  DR McALHANY   LOW RISK NUCLEAR STUDY/  SMALL FIXED AREA OF MODERATELY DECREASED UPTAKE IN ANTEROSEPTAL WALL WHICH MAY BE ARTIFACTUAL/  NO ISCHEMIA/  EF 68%   COLONOSCOPY  09/29/2010   CYSTOSCOPY     CYSTOSCOPY WITH HYDRODISTENSION AND BIOPSY N/A 03/06/2014   Procedure: CYSTOSCOPY/HYDRODISTENSION/ INSTILATION OF MARCAINE AND PYRIDIUM;  Surgeon: Kathi Ludwig, MD;  Location: [facility];  Service: Urology;  Laterality: N/A;   CYSTOSCOPY/URETEROSCOPY/HOLMIUM LASER/STENT PLACEMENT Left 03/21/2023   Procedure: CYSTOSCOPY LEFT RETROGRADE PYELOGRAM, LEFT URETEROSCOPY, HOLMIUM LASER LITHOTRIPSYM, AND LEFT URETERAL STENT PLACEMENT;  Surgeon: Rene Paci, MD;  Location: [facility];  Service: Urology;  Laterality: Left;  60 MINUTES   ESOPHAGEAL MANOMETRY N/A 04/03/2016   Procedure: ESOPHAGEAL MANOMETRY (EM);  Surgeon: Ruffin Frederick, MD;  Location: [facility];  Service: Gastroenterology;  Laterality: N/A;   EXTRACORPOREAL SHOCK WAVE LITHOTRIPSY Left 02/06/2019   Procedure: EXTRACORPOREAL SHOCK WAVE LITHOTRIPSY (ESWL);  Surgeon: Ihor Gully, MD;  Location: [facility];  Service: Urology;  Laterality: Left;   NASAL SINUS SURGERY  1985   ORIF RIGHT ANKLE  FX  2006   POLYPECTOMY     REMOVAL VOCAL CORD CYST  01/2013   RIGHT BREAST BX  08/23/2012   RIGHT HAND SURGERY  X3  LAST ONE 2009   INCLUDES  ORIF RIGHT 5TH FINGER AND REVISION TWICE   SKIN CANCER EXCISION     face,arms   TONSILLECTOMY AND ADENOIDECTOMY  AGE 17   TOTAL ABDOMINAL HYSTERECTOMY W/ BILATERAL SALPINGOOPHORECTOMY  1982   W/  APPENDECTOMY    TRANSTHORACIC ECHOCARDIOGRAM  06/24/2012   GRADE I DIASTOLIC DYSFUNCTION/  EF 55-60%/  MILD MR   UPPER GASTROINTESTINAL ENDOSCOPY      HEMATOLOGY/ONCOLOGY HISTORY:  Oncology History  Breast cancer, right breast (HCC) (Resolved)  10/11/2012 Definitive Surgery   Right lumpectomy: DCIS, low grade, ER+, PR+, negative margins   10/11/2012 Pathologic Stage   Stage 0: Tis N0    - 12/05/2012 Radiation Therapy   Adjuvant RT:   11/2012 - 02/2013 Anti-estrogen oral therapy   Tamoxifen 20 mg; poorly tolerated and discontinued     ALLERGIES:  is allergic to clindamycin hcl, penicillins, rosuvastatin, baclofen, codeine, lincomycin, prednisone, doxycycline, erythromycin, haemophilus influenzae, haemophilus influenzae vaccines, latex, levofloxacin, other, pentazocine lactate, pneumococcal vaccine polyvalent, tamoxifen, and fluzone [influenza virus vaccine].  MEDICATIONS:  Current Outpatient Medications  Medication Sig Dispense Refill   acetaminophen (TYLENOL) 500 MG tablet Take 1,000 mg by mouth every 6 (six) hours as needed for mild pain or headache.      albuterol (VENTOLIN HFA) 108 (90 Base) MCG/ACT inhaler Inhale 2 puffs into the lungs every 4 (four) hours as needed for wheezing or shortness of breath (coughing fits). 18 g 1   amLODipine (NORVASC) 5 MG tablet Take 5 mg by mouth daily.     aspirin EC 81 MG tablet Take 1 tablet (81 mg total) by mouth daily. 90 tablet 3   atenolol (TENORMIN) 25 MG tablet Take 1 tablet (25 mg total) by mouth 2 (two) times daily. Take 1 tablet by mouth 2 time a day 180 tablet 3   benzonatate (TESSALON PERLES) 100 MG capsule Take 1 capsule (100 mg total) by mouth 3 (three) times daily as needed for cough. 60 capsule 0   CALCIUM PO Take 500 mg by mouth daily.     carboxymethylcellul-glycerin (OPTIVE) 0.5-0.9 % ophthalmic solution Place 1 drop into both eyes daily as needed for dry eyes.     Cholecalciferol (VITAMIN D3) 50 MCG (2000 UT) CAPS Take 2,000 Units by mouth  daily.     cyanocobalamin (VITAMIN B12) 1000 MCG tablet Take 1,000 mg by mouth daily with breakfast.     diclofenac Sodium (VOLTAREN) 1 % GEL Apply 2 g topically daily as needed (Back pain).     Docusate Sodium (DSS) 100 MG CAPS Take 100 mg by mouth daily as needed (Constipation).     DULoxetine (CYMBALTA) 60 MG capsule Take 1 capsule by mouth twice daily 180 capsule 0   famotidine (PEPCID) 20 MG tablet Take 1 tablet (20 mg total) by mouth 2 (two) times daily as needed for heartburn or indigestion. 60 tablet 1   fluticasone (FLONASE) 50 MCG/ACT nasal spray Use 2 spray(s) in each nostril  once daily (Patient taking differently: Place 2 sprays into both nostrils daily as needed for allergies or rhinitis.) 16 g 5   fluticasone (FLOVENT HFA) 110 MCG/ACT inhaler Inhale 2 puffs into the lungs 2 (two) times daily. 1 each 5   folic acid (FOLVITE) 1 MG tablet Take 1 tablet (1 mg total) by mouth daily.     furosemide (LASIX) 20 MG tablet TAKE 1 TABLET BY MOUTH AS NEEDED FOR EDEMA 90 tablet 3   gabapentin (NEURONTIN) 300 MG capsule Take 300-900 mg by mouth See admin instructions. Take 600 mg in the morning 300 mg at lunch time and 900 mg at bedtime     hydrOXYzine (ATARAX) 10 MG tablet Take 1 tablet (10 mg total) by mouth 3 (three) times daily as needed for anxiety (not with Loratadine). 90 tablet 2   lidocaine (LIDODERM) 5 % Place 1 patch onto the skin daily. Remove & Discard patch within 12 hours or as directed by MD 30 patch 0   loratadine (CLARITIN) 10 MG tablet Take 10 mg by mouth daily as needed for rhinitis.     nitroGLYCERIN (NITROSTAT) 0.4 MG SL tablet Place 1 tablet (0.4 mg total) under the tongue every 5 (five) minutes x 3 doses as needed for chest pain. 25 tablet 2   nystatin cream (MYCOSTATIN) Apply 1 application topically 2 (two) times daily. (Patient taking differently: Apply 1 application  topically daily as needed (vaginal yeast).) 30 g 2   pantoprazole (PROTONIX) 40 MG tablet Take 40 mg by  mouth 2 (two) times daily.     pentosan polysulfate (ELMIRON) 100 MG capsule Take 1 capsule (100 mg total) by mouth 3 (three) times daily. Reported on 01/05/2016 (Patient taking differently: Take 100 mg by mouth daily as needed (burning and pain). Reported on 01/05/2016) 90 capsule 11   polyethylene glycol (MIRALAX) 17 g packet Take 17 g by mouth 2 (two) times daily. 14 each 0   REPATHA SURECLICK 140 MG/ML SOAJ INJECT 140 MG INTO THE SKIN EVERY 14 DAYS 6 mL 0   sucralfate (CARAFATE) 1 GM/10ML suspension Take 10 mLs (1 g total) by mouth 4 (four) times daily -  with meals and at bedtime. 420 mL 1   traMADol (ULTRAM) 50 MG tablet Take 25 mg by mouth 2 (two) times daily.     traMADol (ULTRAM) 50 MG tablet Take 1 tablet by mouth 3 (three) times daily as needed.     traZODone (DESYREL) 100 MG tablet TAKE 1 TABLET BY MOUTH AT BEDTIME 90 tablet 0   Vitamin D, Ergocalciferol, (DRISDOL) 1.25 MG (50000 UNIT) CAPS capsule Take 1 capsule (50,000 Units total) by mouth every 7 (seven) days. 12x Weeks 5 capsule 3   EPINEPHrine 0.3 mg/0.3 mL IJ SOAJ injection Inject 0.3 mg into the muscle as needed for anaphylaxis. (Patient not taking: Reported on 11/07/2023) 2 each 1   No current facility-administered medications for this visit.    VITAL SIGNS: BP (!) 160/70 (BP Location: Right Arm, Patient Position: Sitting)   Pulse 72   Temp 98.2 F (36.8 C) (Oral)   Resp 18   Ht 5\' 3"  (1.6 m)   Wt 124 lb 1.3 oz (56.3 kg)   SpO2 100%   BMI 21.98 kg/m  Filed Weights   11/07/23 1006  Weight: 124 lb 1.3 oz (56.3 kg)    Estimated body mass index is 21.98 kg/m as calculated from the following:   Height as of this encounter: 5\' 3"  (1.6 m).  Weight as of this encounter: 124 lb 1.3 oz (56.3 kg).  LABS: CBC:    Component Value Date/Time   WBC 7.8 11/07/2023 0942   WBC 8.7 11/05/2023 1547   HGB 13.5 11/07/2023 0942   HGB 11.8 05/25/2023 1231   HGB 11.3 (L) 04/20/2015 1050   HCT 43.2 11/07/2023 0942   HCT 36.3  05/25/2023 1231   HCT 36.0 04/20/2015 1050   PLT 252 11/07/2023 0942   PLT 348 05/25/2023 1231   MCV 93.1 11/07/2023 0942   MCV 85 05/25/2023 1231   MCV 90.9 04/20/2015 1050   NEUTROABS 5.5 11/07/2023 0942   NEUTROABS 6.2 05/25/2023 1231   NEUTROABS 3.0 04/20/2015 1050   LYMPHSABS 1.2 11/07/2023 0942   LYMPHSABS 2.1 05/25/2023 1231   LYMPHSABS 1.3 04/20/2015 1050   MONOABS 0.6 11/07/2023 0942   MONOABS 0.5 04/20/2015 1050   EOSABS 0.3 11/07/2023 0942   EOSABS 0.5 (H) 05/25/2023 1231   BASOSABS 0.1 11/07/2023 0942   BASOSABS 0.1 05/25/2023 1231   BASOSABS 0.0 04/20/2015 1050   Comprehensive Metabolic Panel:    Component Value Date/Time   NA 138 11/05/2023 1547   NA 138 05/25/2023 1231   NA 140 04/20/2015 1050   K 4.2 11/05/2023 1547   K 3.9 04/20/2015 1050   CL 100 11/05/2023 1547   CL 103 11/12/2012 1549   CO2 30 11/05/2023 1547   CO2 26 04/20/2015 1050   BUN 13 11/05/2023 1547   BUN 13 05/25/2023 1231   BUN 13.1 04/20/2015 1050   CREATININE 0.95 11/05/2023 1547   CREATININE 1.1 04/20/2015 1050   GLUCOSE 99 11/05/2023 1547   GLUCOSE 82 04/20/2015 1050   GLUCOSE 103 (H) 11/12/2012 1549   CALCIUM 8.8 (L) 11/05/2023 1547   CALCIUM 8.9 04/20/2015 1050   AST 15 08/28/2023 1031   AST 23 04/20/2015 1050   ALT 8 08/28/2023 1031   ALT 16 04/20/2015 1050   ALKPHOS 118 (H) 08/28/2023 1031   ALKPHOS 87 04/20/2015 1050   BILITOT 0.5 08/28/2023 1031   BILITOT 0.3 05/25/2023 1231   BILITOT 0.26 04/20/2015 1050   PROT 6.2 09/04/2023 0928   PROT 6.6 05/25/2023 1231   PROT 6.8 04/20/2015 1050   ALBUMIN 3.6 08/28/2023 1031   ALBUMIN 4.1 05/25/2023 1231   ALBUMIN 3.7 04/20/2015 1050    RADIOGRAPHIC STUDIES: DG Chest 2 View  Result Date: 11/05/2023 CLINICAL DATA:  Chest pain EXAM: CHEST - 2 VIEW COMPARISON:  Chest x-ray 09/20/2023 FINDINGS: The heart size and mediastinal contours are within normal limits. Both lungs are clear. The visualized skeletal structures are  unremarkable. IMPRESSION: No active cardiopulmonary disease. Electronically Signed   By: Darliss Cheney M.D.   On: 11/05/2023 19:01    PERFORMANCE STATUS (ECOG) : 1 - Symptomatic but completely ambulatory  Review of Systems Unless otherwise noted, a complete review of systems is negative.  Physical Exam General: NAD. Using a cane to ambulate  Cardiovascular: regular rate and rhythm Pulmonary: clear ant fields Abdomen: soft, nontender, + bowel sounds GU: no suprapubic tenderness Extremities: no edema, no joint deformities Skin: no rashes Neurological: Weakness but otherwise nonfocal  Assessment and Plan- Patient is a 77 y.o. female with IDA secondary to malabsorption. Current treatment is PRN IV Feraheme with her last infusion being in October.   At the time of her initial consult on 08/28/2023 her Hgb was 11.9, B12 was 256. In August her iron saturation was 8% and ferritin was 8.   Today Hgb has improved.  Iron studied pending- will replace if needed  EPO pending - discussed ESA if BP is improved. She will work with her PCP regarding her BP  RTC 2 months APP, labs (CBC, CMP, retic, iron, ferritin, B12)  Patient expressed understanding and was in agreement with this plan. She also understands that She can call clinic at any time with any questions, concerns, or complaints.   Thank you for allowing me to participate in the care of this very pleasant patient.    Visit consisted of counseling and education dealing with the complex and emotionally intense issues of symptom management in the setting of serious illness.Greater than 50%  of this time was spent counseling and coordinating care related to the above assessment and plan.  Signed by: Clent Jacks, PA-C

## 2023-11-08 LAB — ERYTHROPOIETIN: Erythropoietin: 16.4 m[IU]/mL (ref 2.6–18.5)

## 2023-11-09 DIAGNOSIS — M549 Dorsalgia, unspecified: Secondary | ICD-10-CM | POA: Diagnosis not present

## 2023-11-09 DIAGNOSIS — G5603 Carpal tunnel syndrome, bilateral upper limbs: Secondary | ICD-10-CM | POA: Diagnosis not present

## 2023-11-09 DIAGNOSIS — M180 Bilateral primary osteoarthritis of first carpometacarpal joints: Secondary | ICD-10-CM | POA: Diagnosis not present

## 2023-11-09 DIAGNOSIS — N3010 Interstitial cystitis (chronic) without hematuria: Secondary | ICD-10-CM | POA: Diagnosis not present

## 2023-11-13 ENCOUNTER — Telehealth: Payer: Self-pay | Admitting: *Deleted

## 2023-11-13 DIAGNOSIS — M25572 Pain in left ankle and joints of left foot: Secondary | ICD-10-CM | POA: Diagnosis not present

## 2023-11-13 DIAGNOSIS — S93492A Sprain of other ligament of left ankle, initial encounter: Secondary | ICD-10-CM | POA: Diagnosis not present

## 2023-11-13 NOTE — Telephone Encounter (Signed)
Left message on machine.  We received a prior auth and a non formulary exception form for patches that the ER written for her on 11/05/23.  Wanted to see if she picked this up already or she still waiting.  We received fax on 11/12/23 from pharmacy,

## 2023-11-16 ENCOUNTER — Ambulatory Visit: Payer: Medicare Other | Admitting: Professional

## 2023-11-21 DIAGNOSIS — M25572 Pain in left ankle and joints of left foot: Secondary | ICD-10-CM | POA: Diagnosis not present

## 2023-11-21 DIAGNOSIS — M5416 Radiculopathy, lumbar region: Secondary | ICD-10-CM | POA: Diagnosis not present

## 2023-11-23 ENCOUNTER — Other Ambulatory Visit: Payer: Self-pay | Admitting: Family Medicine

## 2023-11-25 NOTE — Assessment & Plan Note (Signed)
Hydrate and monitor 

## 2023-11-25 NOTE — Assessment & Plan Note (Signed)
Supplement and monitor 

## 2023-11-25 NOTE — Assessment & Plan Note (Signed)
Well controlled, no changes to meds. Encouraged heart healthy diet such as the DASH diet and exercise as tolerated.  °

## 2023-11-25 NOTE — Assessment & Plan Note (Signed)
Does not tolerate statins. Encourage heart healthy diet such as MIND or DASH diet, increase exercise, avoid trans fats, simple carbohydrates and processed foods, consider a krill or fish or flaxseed oil cap daily.

## 2023-11-25 NOTE — Assessment & Plan Note (Signed)
Does not tolerate statins.

## 2023-11-25 NOTE — Assessment & Plan Note (Signed)
Continue to monitor

## 2023-11-27 ENCOUNTER — Telehealth: Payer: Self-pay | Admitting: *Deleted

## 2023-11-27 ENCOUNTER — Ambulatory Visit (INDEPENDENT_AMBULATORY_CARE_PROVIDER_SITE_OTHER): Payer: Medicare Other | Admitting: Family Medicine

## 2023-11-27 VITALS — BP 118/70 | HR 78 | Temp 98.2°F | Resp 18 | Ht 64.0 in | Wt 125.8 lb

## 2023-11-27 DIAGNOSIS — M5030 Other cervical disc degeneration, unspecified cervical region: Secondary | ICD-10-CM

## 2023-11-27 DIAGNOSIS — R7989 Other specified abnormal findings of blood chemistry: Secondary | ICD-10-CM

## 2023-11-27 DIAGNOSIS — N289 Disorder of kidney and ureter, unspecified: Secondary | ICD-10-CM

## 2023-11-27 DIAGNOSIS — N189 Chronic kidney disease, unspecified: Secondary | ICD-10-CM

## 2023-11-27 DIAGNOSIS — I10 Essential (primary) hypertension: Secondary | ICD-10-CM

## 2023-11-27 DIAGNOSIS — G8929 Other chronic pain: Secondary | ICD-10-CM

## 2023-11-27 DIAGNOSIS — E785 Hyperlipidemia, unspecified: Secondary | ICD-10-CM

## 2023-11-27 DIAGNOSIS — Z789 Other specified health status: Secondary | ICD-10-CM | POA: Diagnosis not present

## 2023-11-27 DIAGNOSIS — M4150 Other secondary scoliosis, site unspecified: Secondary | ICD-10-CM

## 2023-11-27 DIAGNOSIS — E538 Deficiency of other specified B group vitamins: Secondary | ICD-10-CM

## 2023-11-27 DIAGNOSIS — N183 Chronic kidney disease, stage 3 unspecified: Secondary | ICD-10-CM

## 2023-11-27 DIAGNOSIS — M545 Low back pain, unspecified: Secondary | ICD-10-CM

## 2023-11-27 MED ORDER — HYDROXYZINE HCL 10 MG PO TABS
5.0000 mg | ORAL_TABLET | Freq: Three times a day (TID) | ORAL | 2 refills | Status: AC | PRN
Start: 2023-11-27 — End: ?

## 2023-11-27 NOTE — Telephone Encounter (Signed)
Patient did not ever get the lidocaine patches that was prescribed at ER.  It had needed a prior auth and the ER does not do prior auths.  Do you recommend her getting the ones over the counter?

## 2023-11-27 NOTE — Patient Instructions (Addendum)
Fasciculations are muscle tremors   Chronic Back Pain Chronic back pain is back pain that lasts longer than 3 months. The cause of your back pain may not be known. Some common causes include: Wear and tear (degenerative disease) of the bones, disks, or tissues that connect bones to each other (ligaments) in your back. Inflammation and stiffness in your back (arthritis). If you have chronic back pain, you may have times when the pain is more intense (flare-ups). You can also learn to manage the pain with home care. Follow these instructions at home: Watch for any changes in your symptoms. Take these actions to help with your pain: Managing pain and stiffness     If told, put ice on the painful area. You may be told to apply ice for the first 24-48 hours after a flare-up starts. Put ice in a plastic bag. Place a towel between your skin and the bag. Leave the ice on for 20 minutes, 2-3 times per day. If told, apply heat to the affected area as often as told by your health care provider. Use the heat source that your provider recommends, such as a moist heat pack or a heating pad. Place a towel between your skin and the heat source. Leave the heat on for 20-30 minutes. If your skin turns bright red, remove the ice or heat right away to prevent skin damage. The risk of damage is higher if you cannot feel pain, heat, or cold. Try soaking in a warm tub. Activity        Avoid bending and other activities that make the pain worse. Have good posture when you stand or sit. When you stand, keep your upper back and neck straight, with your shoulders pulled back. Avoid slouching. When you sit, keep your back straight. Relax your shoulders. Do not round your shoulders or pull them backward. Do not sit or stand in one place for too long. Take brief periods of rest during the day. This will reduce your pain. Resting in a lying or standing position is often better than sitting to rest. When you rest  for longer periods, mix in some mild activity or stretching between periods of rest. This will help to prevent stiffness and pain. Get regular exercise. Ask your provider what activities are safe for you. You may have to avoid lifting. Ask your provider how much you can safely lift. If you do lift, always use the right technique. This means you should: Bend your knees. Keep the load close to your body. Avoid twisting. Medicines Take over-the-counter and prescription medicines only as told by your provider. You may need to take medicines for pain and inflammation. These may be taken by mouth or put on the skin. You may also be given muscle relaxants. Ask your provider if the medicine prescribed to you: Requires you to avoid driving or using machinery. Can cause constipation. You may need to take these actions to prevent or treat constipation: Drink enough fluid to keep your pee (urine) pale yellow. Take over-the-counter or prescription medicines. Eat foods that are high in fiber, such as beans, whole grains, and fresh fruits and vegetables. Limit foods that are high in fat and processed sugars, such as fried or sweet foods. General instructions  Sleep on a firm mattress in a comfortable position. Try lying on your side with your knees slightly bent. If you lie on your back, put a pillow under your knees. Do not use any products that contain nicotine or tobacco.  These products include cigarettes, chewing tobacco, and vaping devices, such as e-cigarettes. If you need help quitting, ask your provider. Contact a health care provider if: You have pain that does not get better with rest or medicine. You have new pain. You have a fever. You lose weight quickly. You have trouble doing your normal activities. You feel weak or numb in one or both of your legs or feet. Get help right away if: You are not able to control when you pee or poop. You have severe back pain and: Nausea or vomiting. Pain  in your chest or abdomen. Shortness of breath. You faint. These symptoms may be an emergency. Get help right away. Call 911. Do not wait to see if the symptoms will go away. Do not drive yourself to the hospital. This information is not intended to replace advice given to you by your health care provider. Make sure you discuss any questions you have with your health care provider. Document Revised: 07/31/2022 Document Reviewed: 07/31/2022 [employer] Patient Education  2024 [employer].

## 2023-11-28 ENCOUNTER — Encounter: Payer: Self-pay | Admitting: Family Medicine

## 2023-11-28 NOTE — Progress Notes (Signed)
Subjective:    Patient ID: [name], female    DOB: 02-08-1946, 77 y.o.   MRN: [id number]  Chief Complaint  Patient presents with  . Follow-up    6 week    HPI Discussed the use of AI scribe software for clinical note transcription with the patient, who gave verbal consent to proceed.  History of Present Illness   The patient, with a complex medical history including fibromyalgia and back problems, presents with chronic pain, fatigue, and muscle weakness. The patient reports seeing multiple specialists at [facility] and recently experienced a fall that injured their left foot. The patient describes the pain as constant and all-encompassing, affecting their legs, hands, arms, shoulders, stomach, and ribs. The pain is so severe that it disrupts their sleep and makes it difficult to perform daily activities. The patient also reports muscle weakness, stating that they can hardly hold their arms and have difficulty standing. The patient's fatigue is significant, with even a shower exhausting them for the rest of the day. The patient's spouse corroborates these reports and expresses concern about the patient's ongoing pain and lack of energy.        Past Medical History:  Diagnosis Date  . Abdominal pain 10/26/2013  . Allergic rhinitis 01/25/2016  . ANA positive 07/29/2013  . Anemia   . Arthralgia 08/18/2013  . Arthritis   . Asthma, allergic 08/05/2012  . Atrial flutter    past history- not current  . Atypical chest pain 01/01/2015  . Bilateral carpal tunnel syndrome 03/04/2015  . CAD in native artery 08/25/2010   Minor CAD 2009, 2011, low risk Myoview 2013 and 2016       . Cataract    bilaterally removed   . Cellulitis of breast 02/05/2023  . Chronic constipation   . Chronic cough 01/09/2017  . Chronic interstitial cystitis 02/01/2017  . Chronic night sweats 01/08/2015  . Common bile duct dilatation 02/13/2023  . Concussion    x 3  . 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   . DDD (degenerative disc disease), cervical 09/17/2018  . Degenerative scoliosis 04/22/2018  . Deviated septum 05/12/2016  . Dysphagia   . Dyspnea   . Dysuria 03/09/2021  . Elevated alkaline phosphatase level 01/25/2023  . Essential hypertension 08/25/2010  . External hemorrhoids 02/05/2015  . Facial trauma 01/22/2023  . Family history of adverse reaction to anesthesia   . Family history of malignant hyperthermia    father had this  . Fibrocystic breast disease 10/18/2010  . Fibromyalgia   . Frequency of urination   . Generalized anxiety disorder 08/03/2010  . GERD (gastroesophageal reflux disease)   . Headache 10/13/2013  . High serum vitamin D 05/18/2021  . History of chronic bronchitis   . History of colonic polyp 02/25/2019  . History of hiatal hernia   . History of tachycardia    CONTROLLED  WITH ATENOLOL  . Hyperglycemia 03/08/2021  . Hyperkalemia 09/28/2022  . Hyperlipidemia   . Intertrigo 12/13/2022  . Kidney stone 02/26/2019  . Knee pain, right 04/30/2015  . Left hip pain 04/30/2015  . Leg pain 02/24/2011   Qualifier: Diagnosis of   By: Janit Bern        . Leukocytosis 07/05/2022  . Local reaction to hymenoptera sting 05/09/2023  . Low back pain 09/08/2020  . Major depressive disorder 02/05/2013  . Malignant neoplasm of lower-outer quadrant of right breast of female, estrogen receptor positive 01/03/2013  . Nasal turbinate hypertrophy 05/12/2016  .  Oral herpes 02/05/2023  . OSA and COPD overlap syndrome 06/10/2020  . Osteoporosis 01/2019   T score -2.2 stable/improved from prior study  . Pelvic pain   . Peripheral neuropathy 05/22/2014  . Personal history of radiation therapy   . Pneumonia   . Primary insomnia 06/25/2017  . Pulmonary nodules 02/12/2015  . Rash and other nonspecific skin eruption 05/09/2023  . Recurrent falls 05/02/2020  . Recurrent sinusitis 02/25/2019  . Right arm pain 07/20/2016  . RLS (restless legs syndrome) 11/09/2019  .  RUQ pain 07/05/2022  . S/P radiation therapy 11/12/12 - 12/05/12   right Breast  . Sciatica 04/30/2015  . Sepsis 2014   from UTI   . Sjogren's disease 12/12/2021  . Splenic lesion 09/02/2012  . Sprain of lateral ligament of ankle joint 07/31/2023  . Stage 3 chronic kidney disease   . Statin intolerance 02/29/2020  . Stricture and stenosis of esophagus 10/19/2011  . Tongue lesion 02/05/2023  . Tremor 07/05/2022  . Urgency of urination   . Vertigo 01/22/2023  . Vitamin B12 deficiency 03/08/2021  . Vocal cord cyst 05/18/2021    Past Surgical History:  Procedure Laterality Date  . 24 HOUR PH STUDY N/A 04/03/2016   Procedure: 24 HOUR PH STUDY;  Surgeon: Ruffin Frederick, MD;  Location: [facility];  Service: Gastroenterology;  Laterality: N/A;  . BREAST BIOPSY Right 08/23/2012   ADH  . BREAST BIOPSY Right 10/11/2012   Ductal Carcinoma  . BREAST EXCISIONAL BIOPSY Right 09/04/2013   benign  . BREAST LUMPECTOMY Right 10/11/2012   W/ SLN BX  . CARDIAC CATHETERIZATION  09-13-2007  DR Riley Kill   WELL-PRESERVED LVF/  DIFFUSE SCATTERED CORONARY CALCIFACATION AND ATHEROSCLEROSIS WITHOUT OBSTRUCTION  . CARDIAC CATHETERIZATION  08-04-2010  DR Clifton James   NON-OBSTRUCTIVE CAD/  pLAD 40%/  oLAD 30%/  mLAD 30%/  pRCA 30%/  EF 60%  . CARDIOVASCULAR STRESS TEST  06-18-2012  DR McALHANY   LOW RISK NUCLEAR STUDY/  SMALL FIXED AREA OF MODERATELY DECREASED UPTAKE IN ANTEROSEPTAL WALL WHICH MAY BE ARTIFACTUAL/  NO ISCHEMIA/  EF 68%  . COLONOSCOPY  09/29/2010  . CYSTOSCOPY    . CYSTOSCOPY WITH HYDRODISTENSION AND BIOPSY N/A 03/06/2014   Procedure: CYSTOSCOPY/HYDRODISTENSION/ INSTILATION OF MARCAINE AND PYRIDIUM;  Surgeon: Kathi Ludwig, MD;  Location: [facility];  Service: Urology;  Laterality: N/A;  . CYSTOSCOPY/URETEROSCOPY/HOLMIUM LASER/STENT PLACEMENT Left 03/21/2023   Procedure: CYSTOSCOPY LEFT RETROGRADE PYELOGRAM, LEFT URETEROSCOPY, HOLMIUM LASER LITHOTRIPSYM, AND  LEFT URETERAL STENT PLACEMENT;  Surgeon: Rene Paci, MD;  Location: [facility];  Service: Urology;  Laterality: Left;  60 MINUTES  . ESOPHAGEAL MANOMETRY N/A 04/03/2016   Procedure: ESOPHAGEAL MANOMETRY (EM);  Surgeon: Ruffin Frederick, MD;  Location: [facility];  Service: Gastroenterology;  Laterality: N/A;  . EXTRACORPOREAL SHOCK WAVE LITHOTRIPSY Left 02/06/2019   Procedure: EXTRACORPOREAL SHOCK WAVE LITHOTRIPSY (ESWL);  Surgeon: Ihor Gully, MD;  Location: [facility];  Service: Urology;  Laterality: Left;  . NASAL SINUS SURGERY  1985  . ORIF RIGHT ANKLE  FX  2006  . POLYPECTOMY    . REMOVAL VOCAL CORD CYST  01/2013  . RIGHT BREAST BX  08/23/2012  . RIGHT HAND SURGERY  X3  LAST ONE 2009   INCLUDES  ORIF RIGHT 5TH FINGER AND REVISION TWICE  . SKIN CANCER EXCISION     face,arms  . TONSILLECTOMY AND ADENOIDECTOMY  AGE 50  . TOTAL ABDOMINAL HYSTERECTOMY W/ BILATERAL SALPINGOOPHORECTOMY  1982   W/  APPENDECTOMY  .  TRANSTHORACIC ECHOCARDIOGRAM  06/24/2012   GRADE I DIASTOLIC DYSFUNCTION/  EF 55-60%/  MILD MR  . UPPER GASTROINTESTINAL ENDOSCOPY      Family History  Problem Relation Age of Onset  . Rectal cancer Mother   . Colon cancer Mother   . Pancreatic cancer Mother   . Diabetes Mother   . Breast cancer Mother 31  . Breast cancer Maternal Aunt        breast  . Birth defects Maternal Aunt   . Irritable bowel syndrome Son   . Heart disease Son        CAD, MV replacement  . Allergic Disorder Daughter   . Diabetes Daughter   . Colon cancer Father   . Colon polyps Father   . Diabetes Father   . Stroke Father   . Heart disease Father        CHF  . Hyperlipidemia Father   . Hypertension Father   . Arthritis Father   . Breast cancer Paternal Aunt   . Breast cancer Paternal Aunt   . Arthritis Paternal Uncle   . Allergic Disorder Daughter   . Heart disease Cousin        CAD, had a blood clot after stenting  . Esophageal cancer Neg Hx   . Stomach cancer Neg Hx      Social History   Socioeconomic History  . Marital status: Married    Spouse name: Windy Fast   . Number of children: 3  . Years of education: 16  . Highest education level: Bachelor's degree (e.g., BA, AB, BS)  Occupational History  . Occupation: Retired    Comment: Charity fundraiser  Tobacco Use  . Smoking status: Former    Current packs/day: 0.00    Average packs/day: 1 pack/day for 15.0 years (15.0 ttl pk-yrs)    Types: Cigarettes    Start date: 05/27/1990    Quit date: 05/27/2005    Years since quitting: 18.5  . Smokeless tobacco: Never  Vaping Use  . Vaping status: Never Used  Substance and Sexual Activity  . Alcohol use: No    Alcohol/week: 0.0 standard drinks of alcohol  . Drug use: No  . Sexual activity: Not Currently    Partners: Male    Birth control/protection: Surgical    Comment: 1st intercourse 77 yo-Fewer than 5 partners, hysterectomy  Other Topics Concern  . Not on file  Social History Narrative   Lives with husband.   Caffeine use: 1/2 cup per day   Exercise-- 2days a week [employer],  Water aerobics, walking       Retired Engineer, civil (consulting)   No dietary restrictions, tries to maintain a heart healthy diet   Very pleasant lady   Social Determinants of Health   Financial Resource Strain: Low Risk  (10/20/2023)   Overall Financial Resource Strain (CARDIA)   . Difficulty of Paying Living Expenses: Not hard at all  Food Insecurity: No Food Insecurity (10/20/2023)   Hunger Vital Sign   . Worried About Programme researcher, broadcasting/film/video in the Last Year: Never true   . Ran Out of Food in the Last Year: Never true  Transportation Needs: No Transportation Needs (10/20/2023)   PRAPARE - Transportation   . Lack of Transportation (Medical): No   . Lack of Transportation (Non-Medical): No  Physical Activity: Insufficiently Active (10/20/2023)   Exercise Vital Sign   . Days of Exercise per Week: 2 days   . Minutes of Exercise per Session: 20 min  Stress: Stress Concern  Present (10/20/2023)   [employer] of Occupational Health - Occupational Stress Questionnaire   . Feeling of Stress : To some extent  Social Connections: Socially Integrated (10/20/2023)   Social Connection and Isolation Panel [NHANES]   . Frequency of Communication with Friends and Family: More than three times a week   . Frequency of Social Gatherings with Friends and Family: Three times a week   . Attends Religious Services: More than 4 times per year   . Active Member of Clubs or Organizations: Yes   . Attends Banker Meetings: More than 4 times per year   . Marital Status: Married  Catering manager Violence: Not At Risk (09/12/2023)   Humiliation, Afraid, Rape, and Kick questionnaire   . Fear of Current or Ex-Partner: No   . Emotionally Abused: No   . Physically Abused: No   . Sexually Abused: No    Outpatient Medications Prior to Visit  Medication Sig Dispense Refill  . acetaminophen (TYLENOL) 500 MG tablet Take 1,000 mg by mouth every 6 (six) hours as needed for mild pain or headache.     . albuterol (VENTOLIN HFA) 108 (90 Base) MCG/ACT inhaler Inhale 2 puffs into the lungs every 4 (four) hours as needed for wheezing or shortness of breath (coughing fits). 18 g 1  . amLODipine (NORVASC) 5 MG tablet Take 5 mg by mouth daily.    Marland Kitchen aspirin EC 81 MG tablet Take 1 tablet (81 mg total) by mouth daily. 90 tablet 3  . atenolol (TENORMIN) 25 MG tablet Take 1 tablet (25 mg total) by mouth 2 (two) times daily. Take 1 tablet by mouth 2 time a day 180 tablet 3  . benzonatate (TESSALON PERLES) 100 MG capsule Take 1 capsule (100 mg total) by mouth 3 (three) times daily as needed for cough. 60 capsule 0  . CALCIUM PO Take 500 mg by mouth daily.    . carboxymethylcellul-glycerin (OPTIVE) 0.5-0.9 % ophthalmic solution Place 1 drop into both eyes daily as needed for dry eyes.    . Cholecalciferol (VITAMIN D3) 50 MCG (2000 UT) CAPS Take 2,000 Units by mouth daily.    . cyanocobalamin (VITAMIN B12) 1000 MCG tablet  Take 1,000 mg by mouth daily with breakfast.    . diclofenac Sodium (VOLTAREN) 1 % GEL Apply 2 g topically daily as needed (Back pain).    Tery Sanfilippo Sodium (DSS) 100 MG CAPS Take 100 mg by mouth daily as needed (Constipation).    . DULoxetine (CYMBALTA) 60 MG capsule Take 1 capsule by mouth twice daily 180 capsule 0  . EPINEPHrine 0.3 mg/0.3 mL IJ SOAJ injection Inject 0.3 mg into the muscle as needed for anaphylaxis. 2 each 1  . famotidine (PEPCID) 20 MG tablet Take 1 tablet (20 mg total) by mouth 2 (two) times daily as needed for heartburn or indigestion. 60 tablet 1  . fluticasone (FLONASE) 50 MCG/ACT nasal spray Use 2 spray(s) in each nostril once daily (Patient taking differently: Place 2 sprays into both nostrils daily as needed for allergies or rhinitis.) 16 g 5  . fluticasone (FLOVENT HFA) 110 MCG/ACT inhaler Inhale 2 puffs into the lungs 2 (two) times daily. 1 each 5  . folic acid (FOLVITE) 1 MG tablet Take 1 tablet (1 mg total) by mouth daily.    . furosemide (LASIX) 20 MG tablet TAKE 1 TABLET BY MOUTH AS NEEDED FOR EDEMA 90 tablet 3  . gabapentin (NEURONTIN) 300 MG capsule Take 300-900 mg by mouth  See admin instructions. Take 600 mg in the morning 300 mg at lunch time and 900 mg at bedtime    . lidocaine (LIDODERM) 5 % Place 1 patch onto the skin daily. Remove & Discard patch within 12 hours or as directed by MD 30 patch 0  . loratadine (CLARITIN) 10 MG tablet Take 10 mg by mouth daily as needed for rhinitis.    Marland Kitchen nitroGLYCERIN (NITROSTAT) 0.4 MG SL tablet Place 1 tablet (0.4 mg total) under the tongue every 5 (five) minutes x 3 doses as needed for chest pain. 25 tablet 2  . nystatin cream (MYCOSTATIN) Apply 1 application topically 2 (two) times daily. (Patient taking differently: Apply 1 application  topically daily as needed (vaginal yeast).) 30 g 2  . pantoprazole (PROTONIX) 40 MG tablet Take 40 mg by mouth 2 (two) times daily.    . pentosan polysulfate (ELMIRON) 100 MG capsule Take 1  capsule (100 mg total) by mouth 3 (three) times daily. Reported on 01/05/2016 (Patient taking differently: Take 100 mg by mouth daily as needed (burning and pain). Reported on 01/05/2016) 90 capsule 11  . polyethylene glycol (MIRALAX) 17 g packet Take 17 g by mouth 2 (two) times daily. 14 each 0  . REPATHA SURECLICK 140 MG/ML SOAJ INJECT 140 MG INTO THE SKIN EVERY 14 DAYS 6 mL 0  . sucralfate (CARAFATE) 1 GM/10ML suspension Take 10 mLs (1 g total) by mouth 4 (four) times daily -  with meals and at bedtime. 420 mL 1  . traMADol (ULTRAM) 50 MG tablet Take 25 mg by mouth 2 (two) times daily.    . traMADol (ULTRAM) 50 MG tablet Take 1 tablet by mouth 3 (three) times daily as needed.    . traZODone (DESYREL) 100 MG tablet TAKE 1 TABLET BY MOUTH AT BEDTIME 90 tablet 0  . Vitamin D, Ergocalciferol, (DRISDOL) 1.25 MG (50000 UNIT) CAPS capsule Take 1 capsule (50,000 Units total) by mouth every 7 (seven) days. 12x Weeks 5 capsule 3  . hydrOXYzine (ATARAX) 10 MG tablet Take 1 tablet (10 mg total) by mouth 3 (three) times daily as needed for anxiety (not with Loratadine). 90 tablet 2   No facility-administered medications prior to visit.    Allergies  Allergen Reactions  . Clindamycin Hcl Shortness Of Breath and Rash  . Penicillins Anaphylaxis  . Rosuvastatin Anaphylaxis  . Baclofen Rash  . Codeine Hives and Nausea Only  . Lincomycin Other (See Comments)  . Prednisone Rash    Other reaction(s): Rash, Hives - can tolerate if she takes with benadryl   . Doxycycline     Skin rash  . Erythromycin Hives  . Haemophilus Influenzae Other (See Comments)    Local reaction at the site Local reaction at the site  . Haemophilus Influenzae Vaccines Other (See Comments)    Local reaction at site  . Latex Hives  . Levofloxacin     Other reaction(s): sick  . Other Other (See Comments)    Father had malignant hyperthermia  . Pentazocine Lactate Other (See Comments)    HALLUCINATION  . Pneumococcal Vaccine  Polyvalent Hives, Swelling and Other (See Comments)    REACTION: redness, swelling, and hives at injection site  . Tamoxifen Nausea And Vomiting and Other (See Comments)    HEADACHE Other reaction(s): Headache, Nausea  . Fluzone [Influenza Virus Vaccine] Hives    Local reaction at the site    Review of Systems  Constitutional:  Positive for malaise/fatigue. Negative for fever.  HENT:  Negative for congestion.   Eyes:  Negative for blurred vision.  Respiratory:  Negative for shortness of breath.   Cardiovascular:  Negative for chest pain, palpitations and leg swelling.  Gastrointestinal:  Negative for abdominal pain, blood in stool and nausea.  Genitourinary:  Negative for dysuria and frequency.  Musculoskeletal:  Positive for back pain, joint pain and myalgias. Negative for falls.  Skin:  Negative for rash.  Neurological:  Negative for dizziness, loss of consciousness and headaches.  Endo/Heme/Allergies:  Negative for environmental allergies.  Psychiatric/Behavioral:  Positive for depression. The patient is nervous/anxious.       Objective:    Physical Exam Constitutional:      General: She is not in acute distress.    Appearance: Normal appearance. She is well-developed. She is not toxic-appearing.  HENT:     Head: Normocephalic and atraumatic.     Right Ear: External ear normal.     Left Ear: External ear normal.     Nose: Nose normal.  Eyes:     General:        Right eye: No discharge.        Left eye: No discharge.     Conjunctiva/sclera: Conjunctivae normal.  Neck:     Thyroid: No thyromegaly.  Cardiovascular:     Rate and Rhythm: Normal rate and regular rhythm.     Heart sounds: Normal heart sounds. No murmur heard. Pulmonary:     Effort: Pulmonary effort is normal. No respiratory distress.     Breath sounds: Normal breath sounds.  Abdominal:     General: Bowel sounds are normal.     Palpations: Abdomen is soft.     Tenderness: There is no abdominal  tenderness. There is no guarding.  Musculoskeletal:        General: Normal range of motion.     Cervical back: Neck supple.  Lymphadenopathy:     Cervical: No cervical adenopathy.  Skin:    General: Skin is warm and dry.  Neurological:     Mental Status: She is alert and oriented to person, place, and time.  Psychiatric:        Mood and Affect: Mood normal.        Behavior: Behavior normal.        Thought Content: Thought content normal.        Judgment: Judgment normal.   BP 118/70 (BP Location: Left Arm, Patient Position: Sitting, Cuff Size: Normal)   Pulse 78   Temp 98.2 F (36.8 C) (Oral)   Resp 18   Ht 5\' 4"  (1.626 m)   Wt 125 lb 12.8 oz (57.1 kg)   SpO2 99%   BMI 21.59 kg/m  Wt Readings from Last 3 Encounters:  11/27/23 125 lb 12.8 oz (57.1 kg)  11/07/23 124 lb 1.3 oz (56.3 kg)  11/05/23 124 lb (56.2 kg)    Diabetic Foot Exam - Simple   No data filed    Lab Results  Component Value Date   WBC 7.8 11/07/2023   HGB 13.5 11/07/2023   HCT 43.2 11/07/2023   PLT 252 11/07/2023   GLUCOSE 113 (H) 11/07/2023   CHOL 131 08/28/2023   TRIG 122.0 08/28/2023   HDL 39.40 08/28/2023   LDLDIRECT 74.0 06/12/2023   LDLCALC 67 08/28/2023   ALT 9 11/07/2023   AST 20 11/07/2023   NA 139 11/07/2023   K 3.7 11/07/2023   CL 101 11/07/2023   CREATININE 1.05 (H) 11/07/2023   BUN 13 11/07/2023  CO2 29 11/07/2023   TSH 2.17 08/28/2023   INR 1.05 08/03/2010   HGBA1C 5.5 05/28/2023   MICROALBUR <0.7 08/08/2023    Lab Results  Component Value Date   TSH 2.17 08/28/2023   Lab Results  Component Value Date   WBC 7.8 11/07/2023   HGB 13.5 11/07/2023   HCT 43.2 11/07/2023   MCV 93.1 11/07/2023   PLT 252 11/07/2023   Lab Results  Component Value Date   NA 139 11/07/2023   K 3.7 11/07/2023   CHLORIDE 102 04/20/2015   CO2 29 11/07/2023   GLUCOSE 113 (H) 11/07/2023   BUN 13 11/07/2023   CREATININE 1.05 (H) 11/07/2023   BILITOT 0.6 11/07/2023   ALKPHOS 100 11/07/2023    AST 20 11/07/2023   ALT 9 11/07/2023   PROT 6.9 11/07/2023   ALBUMIN 3.8 11/07/2023   CALCIUM 8.7 (L) 11/07/2023   ANIONGAP 9 11/07/2023   EGFR 50.0 08/15/2023   GFR 47.73 (L) 08/28/2023   Lab Results  Component Value Date   CHOL 131 08/28/2023   Lab Results  Component Value Date   HDL 39.40 08/28/2023   Lab Results  Component Value Date   LDLCALC 67 08/28/2023   Lab Results  Component Value Date   TRIG 122.0 08/28/2023   Lab Results  Component Value Date   CHOLHDL 3 08/28/2023   Lab Results  Component Value Date   HGBA1C 5.5 05/28/2023       Assessment & Plan:  Chronic renal impairment, unspecified CKD stage Assessment & Plan: Hydrate and monitor    Essential hypertension Assessment & Plan: Well controlled, no changes to meds. Encouraged heart healthy diet such as the DASH diet and exercise as tolerated.     Hyperlipidemia, unspecified hyperlipidemia type Assessment & Plan: Does not tolerate statins. Encourage heart healthy diet such as MIND or DASH diet, increase exercise, avoid trans fats, simple carbohydrates and processed foods, consider a krill or fish or flaxseed oil cap daily.     Statin intolerance Assessment & Plan: Does not tolerate statins   Vitamin B12 deficiency Assessment & Plan: Supplement and monitor   Stage 3 chronic kidney disease, unspecified whether stage 3a or 3b CKD (HCC) Assessment & Plan: Hydrate and monitor    High serum vitamin D Assessment & Plan: Continue to monitor   Degenerative scoliosis  DDD (degenerative disc disease), cervical -     Ambulatory referral to Neurosurgery -     Ambulatory referral to Physical Therapy  Chronic low back pain, unspecified back pain laterality, unspecified whether sciatica present -     Ambulatory referral to Neurosurgery -     Ambulatory referral to Physical Therapy  Other orders -     hydrOXYzine HCl; Take 0.5-2 tablets (5-20 mg total) by mouth 3 (three) times daily as  needed for anxiety (not with Loratadine).  Dispense: 90 tablet; Refill: 2    Assessment and Plan    Chronic Pain and Fibromyalgia Diffuse pain, weakness, and fatigue. Pain is exacerbated by touch and is present in multiple locations including the neck, shoulder, elbow, ribs, and stomach. Chronic pain is contributing to fatigue and stress. -Continue Hydroxyzine, with an option to increase the dose to 20mg  at bedtime or split the dose to half every four hours. -Consider adding a restless leg medication if no improvement with increased Hydroxyzine. -Refer to physical therapy for dry needling. -Consider referral to neurosurgery for further evaluation and management.  Foot Pain Left foot pain following a fall, suspected  tendon injury. Pain persists after six weeks. -Obtain MRI of the ankle as discussed.  Back Pain Lower back pain, possibly related to the fall. -Obtain MRI of the lower back as discussed.  Shortness of Breath Likely related to stress and chronic pain, no signs of pulmonary disease on examination. -Continue current management, consider pulmonary function testing if symptoms escalate.  General Health Maintenance -Plan for blood work in two weeks with hematology. -Consider participation in the Gene Connects study for genome mapping.         Danise Edge, MD

## 2023-11-28 NOTE — Telephone Encounter (Signed)
Called and spoke with patient. Given information per provider's note

## 2023-11-29 ENCOUNTER — Ambulatory Visit (INDEPENDENT_AMBULATORY_CARE_PROVIDER_SITE_OTHER): Payer: Medicare Other | Admitting: Family Medicine

## 2023-11-29 ENCOUNTER — Encounter: Payer: Self-pay | Admitting: Family Medicine

## 2023-11-29 VITALS — BP 111/67 | HR 84 | Ht 64.0 in | Wt 125.0 lb

## 2023-11-29 DIAGNOSIS — R778 Other specified abnormalities of plasma proteins: Secondary | ICD-10-CM

## 2023-11-29 DIAGNOSIS — R937 Abnormal findings on diagnostic imaging of other parts of musculoskeletal system: Secondary | ICD-10-CM

## 2023-11-29 LAB — CBC WITH DIFFERENTIAL/PLATELET
Basophils Absolute: 0.1 10*3/uL (ref 0.0–0.1)
Basophils Relative: 1.2 % (ref 0.0–3.0)
Eosinophils Absolute: 0.4 10*3/uL (ref 0.0–0.7)
Eosinophils Relative: 5.6 % — ABNORMAL HIGH (ref 0.0–5.0)
HCT: 42.2 % (ref 36.0–46.0)
Hemoglobin: 13.8 g/dL (ref 12.0–15.0)
Lymphocytes Relative: 17.6 % (ref 12.0–46.0)
Lymphs Abs: 1.4 10*3/uL (ref 0.7–4.0)
MCHC: 32.6 g/dL (ref 30.0–36.0)
MCV: 92.8 fL (ref 78.0–100.0)
Monocytes Absolute: 0.7 10*3/uL (ref 0.1–1.0)
Monocytes Relative: 9.4 % (ref 3.0–12.0)
Neutro Abs: 5.3 10*3/uL (ref 1.4–7.7)
Neutrophils Relative %: 66.2 % (ref 43.0–77.0)
Platelets: 260 10*3/uL (ref 150.0–400.0)
RBC: 4.55 Mil/uL (ref 3.87–5.11)
RDW: 18.2 % — ABNORMAL HIGH (ref 11.5–15.5)
WBC: 8 10*3/uL (ref 4.0–10.5)

## 2023-11-29 LAB — COMPREHENSIVE METABOLIC PANEL
ALT: 9 U/L (ref 0–35)
AST: 16 U/L (ref 0–37)
Albumin: 3.9 g/dL (ref 3.5–5.2)
Alkaline Phosphatase: 117 U/L (ref 39–117)
BUN: 16 mg/dL (ref 6–23)
CO2: 34 meq/L — ABNORMAL HIGH (ref 19–32)
Calcium: 9.4 mg/dL (ref 8.4–10.5)
Chloride: 102 meq/L (ref 96–112)
Creatinine, Ser: 1.14 mg/dL (ref 0.40–1.20)
GFR: 46.14 mL/min — ABNORMAL LOW (ref >60.00)
Glucose, Bld: 73 mg/dL (ref 70–99)
Potassium: 4.7 meq/L (ref 3.5–5.1)
Sodium: 141 meq/L (ref 135–145)
Total Bilirubin: 0.3 mg/dL (ref 0.2–1.2)
Total Protein: 6.2 g/dL (ref 6.0–8.3)

## 2023-11-29 NOTE — Progress Notes (Signed)
Acute Office Visit  Subjective:     Patient ID: ROMANIA FAWVER, female    DOB: Jul 28, 1946, 77 y.o.   MRN: [id number]  Chief Complaint  Patient presents with   Medical Management of Chronic Issues    HPI Patient is in today to discuss recent ortho MRI.   Discussed the use of AI scribe software for clinical note transcription with the patient, who gave verbal consent to proceed.  History of Present Illness   The patient, with a history of chronic low back pain and numbness radiating to the left foot, presents for follow-up after a recent lumbar spine MRI. Over the past six weeks, the patient has noticed a gradual worsening of their back pain.  In addition to the back pain, the patient reports feeling generally unwell, with extreme fatigue and a desire to sleep excessively. They describe widespread pain, with tender lumps under the skin on all extremities, which have been present for approximately a year and a half. These lumps cause discomfort when lying down, particularly on the left side. The patient also reports sharp, intermittent pain in the hip area, which is exacerbated by pressure against a counter or similar surface.  The patient has been seen by multiple specialists, including ortho and a rheumatologist, for these ongoing issues. They are due to see a hematologist in two weeks for anemia follow-up. The patient has expressed readiness for surgical intervention if it would provide relief from their back pain.   Patient states MRI of lumbar spine was done on 11/21/23 and she received a call a few days later from ortho stating that she needed to take results to PCP to discuss as soon as possible.   Scanning results to chart and picture below.  IMPRESSION: Diffuse heterogeneous marrow signal on T1 weighted imaging without fracture, bone marrow edema, or discrete osseous lesion. Finding is new since 2023. Red marrow reconversion in the setting of anemia of chronic disease (diffuse  benign hemopoietic marrow hyperplasia) is favored however multiple myeloma or other marrow proliferative process should be excluded via appropriate serological and medical workup.  No change in mild-moderate multilevel facet arthrosis and foraminal stenosis.  No change in mild right L2-3 and L3-4 subarticular recess stenosis.  No change in mild multilevel disc narrowing and bulging. Mild levoconvex curvature.              ROS All review of systems negative except what is listed in the HPI      Objective:    BP 111/67   Pulse 84   Ht 5\' 4"  (1.626 m)   Wt 125 lb (56.7 kg)   SpO2 98%   BMI 21.46 kg/m    Physical Exam Vitals reviewed.  Constitutional:      Appearance: Normal appearance.  Cardiovascular:     Rate and Rhythm: Normal rate and regular rhythm.     Heart sounds: Normal heart sounds.  Pulmonary:     Effort: Pulmonary effort is normal.     Breath sounds: Normal breath sounds.  Musculoskeletal:     Comments: Scattered areas of sore regions on extremities, minimally palpable  Skin:    General: Skin is warm and dry.  Neurological:     Mental Status: She is alert and oriented to person, place, and time.  Psychiatric:        Mood and Affect: Mood normal.        Behavior: Behavior normal.        Thought Content: Thought content  normal.        Judgment: Judgment normal.           Assessment & Plan:   Problem List Items Addressed This Visit   None Visit Diagnoses     Abnormal MRI, lumbar spine    -  Primary   Relevant Orders   Comprehensive metabolic panel (Completed)   Protein Electrophoresis, (serum)   Protein Electrophoresis, Urine Rflx.   CBC with Differential/Platelet (Completed)   Hypocalcemia       Relevant Orders   Comprehensive metabolic panel (Completed)   Protein Electrophoresis, (serum)   Protein Electrophoresis, Urine Rflx.   CBC with Differential/Platelet (Completed)   Abnormal SPEP       Relevant Orders   Comprehensive  metabolic panel (Completed)   Protein Electrophoresis, (serum)   Protein Electrophoresis, Urine Rflx.   CBC with Differential/Platelet (Completed)       Repeat CBC, CMP, SPEP, UPEP Forwarding note to PCP and hematology (appointment in 2 weeks) for further workup as indicated.   Patient aware of signs/symptoms requiring further/urgent evaluation.      No orders of the defined types were placed in this encounter.   Return for - keep upcoming hematology and PCP follow-up. Come back sooner as needed. Clayborne Dana, NP

## 2023-11-30 ENCOUNTER — Ambulatory Visit: Payer: Medicare Other | Admitting: Professional

## 2023-12-01 LAB — PROTEIN ELECTROPHORESIS, SERUM
Albumin ELP: 3.7 g/dL — ABNORMAL LOW (ref 3.8–4.8)
Alpha 1: 0.3 g/dL (ref 0.2–0.3)
Alpha 2: 0.7 g/dL (ref 0.5–0.9)
Beta 2: 0.4 g/dL (ref 0.2–0.5)
Beta Globulin: 0.3 g/dL — ABNORMAL LOW (ref 0.4–0.6)
Gamma Globulin: 0.7 g/dL — ABNORMAL LOW (ref 0.8–1.7)
Total Protein: 6.2 g/dL (ref 6.1–8.1)

## 2023-12-04 LAB — PROTEIN ELECTROPHORESIS, URINE REFLEX
Albumin ELP, Urine: 16.5 %
Alpha-1-Globulin, U: 4 %
Alpha-2-Globulin, U: 13.9 %
Beta Globulin, U: 37.6 %
Gamma Globulin, U: 28 %
Protein, Ur: 26.7 mg/dL

## 2023-12-05 ENCOUNTER — Encounter: Payer: Self-pay | Admitting: Emergency Medicine

## 2023-12-05 ENCOUNTER — Ambulatory Visit: Payer: Medicare Other | Admitting: Emergency Medicine

## 2023-12-05 VITALS — BP 116/54 | HR 77 | Temp 97.8°F | Ht 64.5 in | Wt 126.2 lb

## 2023-12-05 DIAGNOSIS — J309 Allergic rhinitis, unspecified: Secondary | ICD-10-CM

## 2023-12-05 DIAGNOSIS — J45909 Unspecified asthma, uncomplicated: Secondary | ICD-10-CM

## 2023-12-05 DIAGNOSIS — K219 Gastro-esophageal reflux disease without esophagitis: Secondary | ICD-10-CM | POA: Diagnosis not present

## 2023-12-05 MED ORDER — ALBUTEROL SULFATE HFA 108 (90 BASE) MCG/ACT IN AERS: 2.0000 | INHALATION_SPRAY | RESPIRATORY_TRACT | 1 refills | Status: DC | PRN

## 2023-12-05 NOTE — Progress Notes (Signed)
   Subjective:    Patient ID: [name], female    DOB: Dec 15, 1946, 77 y.o.   MRN: [id number]  HPI 77 year old woman with former tobacco use (10-15 pack years), hypertension, CAD, allergic rhinitis, GERD, breast cancer.  I have seen her for chronic cough and asthma (confirmed by pulmonary function testing 07/2021).  She has a vocal cord hemangioma.  I last saw her in 07/2021 when she was flaring following COVID-19 in March 2022. She is seen by Dr Maude Leriche with Rhe[facility] - is on plaquenil.   Last seen here 08/2022.  She was having persistent symptoms and was started on Flovent, maintained on her allergy regimen.  Today she reports that she improved, and was able to stop the flovent. Was doing welll until she was exposed to air freshener at home. She restarted the flovent 4 days ago. She has not used any albuterol. She remains on loratadine, flonase bid.   ROV 12/05/2023 --[name] is 86 with a history of former tobacco use, breast cancer, hypertension, CAD, allergic rhinitis.  She was on Plaquenil due to ? Sjogren's and suspected FM - off of this now.  Have seen her for asthma and associated chronic cough.  She also has a history of vocal cord hemangioma.  Over the summer she had a flare of coughing and chronic bronchitic symptoms that was slow to resolve.  She was started on Advair in July, was treated with prednisone.  Then she started using flovent but only prn. It doesn't sound like she is using any (or has any) albuterol. Also on fluticasone is spray, atarax, Protonix twice daily, guaifenesin.   Review of Systems As per HPI      Objective:   Physical Exam  Vitals:   12/05/23 1428  BP: (!) 116/54  Pulse: 77  Temp: 97.8 F (36.6 C)  TempSrc: Oral  SpO2: 97%  Weight: 126 lb 3.2 oz (57.2 kg)  Height: 5' 4.5" (1.638 m)   Gen: Pleasant, well-nourished, in no distress,  normal affect  ENT: No lesions,  mouth clear,  oropharynx clear, no postnasal drip, somewhat hoarse voice  Neck: No JVD, no  stridor  Lungs: No use of accessory muscles, no crackles or wheezing on normal respiration, no wheeze on forced expiration  Cardiovascular: RRR, late systolic murmur with intact S2  Musculoskeletal: No deformities, no cyanosis or clubbing.    Neuro: alert, awake, non focal  Skin: Warm, no lesions or rashes     Assessment & Plan:  Asthma, allergic Flaring symptoms this summer but now back to baseline.  She is using Flovent but only as needed.  Believe we can discontinue the ICS, reinitiate albuterol to be used as a rescue inhaler  We can stop your Flovent inhaler We will give your prescription for albuterol.  Use 2 puffs up to every 4 hours if needed for shortness by, chest and wheezing. Follow Dr. Delton Coombes in 1 year, sooner if you have any problems.  Allergic rhinitis Continue your fluticasone nasal spray as you have been taking Continue Atarax as prescribed  GERD (gastroesophageal reflux disease) Continue your pantoprazole twice daily   Levy Pupa, MD, PhD 12/05/2023, 5:01 PM [facility] [phone] or if no answer before 7:00PM call [phone] For any issues after 7:00PM please call eLink [phone]

## 2023-12-05 NOTE — Assessment & Plan Note (Signed)
Continue your fluticasone nasal spray as you have been taking Continue Atarax as prescribed

## 2023-12-05 NOTE — Patient Instructions (Addendum)
We can stop your Flovent inhaler We will give your prescription for albuterol.  Use 2 puffs up to every 4 hours if needed for shortness by, chest and wheezing. Continue your fluticasone nasal spray as you have been taking Continue Atarax as prescribed Continue your pantoprazole twice daily Follow Dr. Delton Coombes in 1 year, sooner if you have any problems.

## 2023-12-05 NOTE — Assessment & Plan Note (Signed)
Continue your pantoprazole twice daily

## 2023-12-05 NOTE — Assessment & Plan Note (Signed)
Flaring symptoms this summer but now back to baseline.  She is using Flovent but only as needed.  Believe we can discontinue the ICS, reinitiate albuterol to be used as a rescue inhaler  We can stop your Flovent inhaler We will give your prescription for albuterol.  Use 2 puffs up to every 4 hours if needed for shortness by, chest and wheezing. Follow Dr. Delton Coombes in 1 year, sooner if you have any problems.

## 2023-12-06 ENCOUNTER — Other Ambulatory Visit: Payer: Self-pay | Admitting: Medical Oncology

## 2023-12-06 DIAGNOSIS — R937 Abnormal findings on diagnostic imaging of other parts of musculoskeletal system: Secondary | ICD-10-CM

## 2023-12-06 DIAGNOSIS — R899 Unspecified abnormal finding in specimens from other organs, systems and tissues: Secondary | ICD-10-CM

## 2023-12-07 ENCOUNTER — Ambulatory Visit: Payer: Medicare Other | Admitting: Physician Assistant

## 2023-12-07 VITALS — BP 131/73 | HR 96 | Resp 20 | Ht 64.5 in

## 2023-12-07 DIAGNOSIS — G629 Polyneuropathy, unspecified: Secondary | ICD-10-CM

## 2023-12-07 NOTE — Patient Instructions (Signed)
Plan    Continue Gabapentin   Follow up t4/02/2023

## 2023-12-07 NOTE — Progress Notes (Signed)
Neurology clinic note  Progress note : Neuropathy      ASSESSMENT: [name] is a 77 y.o. female who presents for continuation of care for neuropathy, initially diagnosed at [facility], then seen here for continuation of care. NCV/EMG  findings are consistent with a chronic and symmetric sensorimotor axonal polyneuropathy affecting the lower extremities, severe in degree electrically, with superimposed L3-L4 radiculopathy (symptomatic) . She had been referred to Ortho and Neurosurgery for further evaluation, PT and pain control with good response. She sees hematology for anemia and MM being investigated as well. Still able to participate on her ADLs without the use of the cane.        PLAN:  Continue present therapy with gabapentin 600 mg in the morning, 600 mg in the afternoon and 900 mg in the evening, discussed decreasing polypharmacy (she is on Cymbalta, Desyrel, Atarax, Ultram) Replenish vitamin B12 at 1000 mcg daily Continue PT for balance and strength, follow with Orthopedics and Neurosurgery for chronic pain issues  Follow-up with hematology for microcytic anemia Continue fall precautions Follow up for memory in 4 months       Update 12/07/23    Medications : She is currently on gabapentin 600 mg in the morning, 600 mg in the afternoon and 900 mg in the evening to help her sleep with good tolerance and relief.   Activity:  She is more active, does PT , although still has less energy .She is receiving iron infusion with sone improvement in tolerance to exercise. She sees Orthopedics for multiple pain issues, last seem on 10/2023 Muscle pain?  Sometimes she has myalgias.  She has a history of fibromyalgia with chronic pain. Cramps/Twitching? Endorsed, the neuropathy "has gone to my knees, worse than before ". Has not had an EMG/NCS in 5-6 years.  Able to brush hair/teeth without difficulty?  "Yes, but my arms get tired " able to button shirts/use zips? Yes  Clumsiness/dropping  grasped objects? "sometimes ".  Can you arise from squatted position easily?  Able to get out of chair without using arms? Yes, does PT at home  Able to walk up steps easily?  "Walking more and my lungs are great!"..  Use an assistive device to walk? Uses a cane   Significant imbalance with walking? Yes, attributed to neuropathy, chronic BPPV and chronic pain due to fibromyalgia Falls?she uses a cane to prevent them, doing PT  Denies any dysarthria, dysphonia, impaired mastication or facial weakness or droop. Denies diplopia, or proptosis. impaired sweating?.  She denies heat intolerance but she reports cold intolerance (?  May be related to anemia), denies excessive mouth dryness, denies bloating, or constipation, or incontinence.   Denies syncope/presyncope or OH   Denies skin rashes. Denies fever, chills, night sweats, anorexia or unintentional weight loss. She has a history of buccal herpes, with flare currently active  ETOH use:  no  Restrictive diet?  heart diet   Pertinent labs/recent studies: B6 normal at 5.0.  CMET remarkable for albumin (3.5 (low)  HPI: [name], a 77 y.o. right-handed female with extensive medical history listed below, initially seen at our office for subjective memory complaints with negative workup, felt to be due to work-related issues including moderate depression, anxiety with prominent psychiatric distress, as well as polypharmacy, without any indication for neurodegenerative disease, who presents to neurology clinic for neuropathy continuation of care.   She has been initially seen at [facility] for balance issues, and falls with negative studies.  She then was seen at [facility] until recently, but due to geographical inconvenience, she prefers to be seen here.  She is currently on gabapentin 600 mg in the morning, 600 mg in the afternoon and 900 mg in the evening to help her sleep with good tolerance and relief.    Patient has been deconditioned, she is  not participating in any activities or physical therapy.  She reports  "there is nothing".  She attributes the lack of activity to feeling tired, and short of breath.  In looking at the notes, he was found to be significantly anemic, suspect symptomatic iron deficiency without acute blood loss. Muscle pain?  Sometimes she has myalgias.  She has a history of fibromyalgia with chronic pain. Cramps/Twitching? Endorsed, the neuropathy "has gone to my knees, worse than before ". Has not had an EMG/NCS in 5-6 years.  Able to brush hair/teeth without difficulty?  "Yes, but my arms get tired " able to button shirts/use zips? Yes  Clumsiness/dropping grasped objects? "sometimes ".  Can you arise from squatted position easily?  "Yes, I am working on that "-she tries to exercise at home.   Able to get out of chair without using arms? Yes  Able to walk up steps easily?  "Yes, but because I am SOB I have not been walking much " Use an assistive device to walk? Uses a cane   Significant imbalance with walking? Yes, attributed to neuropathy, chronic BPPV and chronic pain due to fibromyalgia Falls?she uses a cane to prevent them ". I have not gone to PT for a long time " Denies any dysarthria, dysphonia, impaired mastication or facial weakness or droop. Denies diplopia, or proptosis. She has a history of GERD, for GI dilatation next week. The patient does not report impaired sweating.  She denies heat intolerance but she reports cold intolerance (?  May be related to anemia), denies excessive mouth dryness, denies bloating, or constipation, or incontinence.  Denies syncope/presyncope or OH   Denies skin rashes. Denies fever, chills, night sweats, anorexia or unintentional weight loss. She has a history of buccal herpes, with recent flare ETOH use:  no  Restrictive diet?  heart diet  Family history of neuropathy/myopathy/NM disease?  Denies Current medications being tried for the patient's symptoms include  gabapentin 600 mg, 600 mg and 900 mg    MEDICATIONS:  Outpatient Encounter Medications as of 12/07/2023  Medication Sig   acetaminophen (TYLENOL) 500 MG tablet Take 1,000 mg by mouth every 6 (six) hours as needed for mild pain or headache.    albuterol (VENTOLIN HFA) 108 (90 Base) MCG/ACT inhaler Inhale 2 puffs into the lungs every 4 (four) hours as needed for wheezing or shortness of breath (coughing fits).   amLODipine (NORVASC) 5 MG tablet Take 5 mg by mouth daily.   aspirin EC 81 MG tablet Take 1 tablet (81 mg total) by mouth daily.   atenolol (TENORMIN) 25 MG tablet Take 1 tablet (25 mg total) by mouth 2 (two) times daily. Take 1 tablet by mouth 2 time a day   CALCIUM PO Take 500 mg by mouth daily.   carboxymethylcellul-glycerin (OPTIVE) 0.5-0.9 % ophthalmic solution Place 1 drop into both eyes daily as needed for dry eyes.   Cholecalciferol (VITAMIN D3) 50 MCG (2000 UT) CAPS Take 2,000 Units by mouth daily.   cyanocobalamin (VITAMIN B12) 1000 MCG tablet Take 1,000 mg by mouth daily with breakfast.   diclofenac Sodium (VOLTAREN) 1 % GEL Apply 2 g topically  daily as needed (Back pain).   Docusate Sodium (DSS) 100 MG CAPS Take 100 mg by mouth daily as needed (Constipation).   DULoxetine (CYMBALTA) 60 MG capsule Take 1 capsule by mouth twice daily   EPINEPHrine 0.3 mg/0.3 mL IJ SOAJ injection Inject 0.3 mg into the muscle as needed for anaphylaxis.   famotidine (PEPCID) 20 MG tablet Take 1 tablet (20 mg total) by mouth 2 (two) times daily as needed for heartburn or indigestion.   fluticasone (FLONASE) 50 MCG/ACT nasal spray Use 2 spray(s) in each nostril once daily (Patient taking differently: Place 2 sprays into both nostrils daily as needed for allergies or rhinitis.)   fluticasone (FLOVENT HFA) 110 MCG/ACT inhaler Inhale 2 puffs into the lungs 2 (two) times daily.   folic acid (FOLVITE) 1 MG tablet Take 1 tablet (1 mg total) by mouth daily.   furosemide (LASIX) 20 MG tablet TAKE 1 TABLET  BY MOUTH AS NEEDED FOR EDEMA   gabapentin (NEURONTIN) 300 MG capsule Take 300-900 mg by mouth See admin instructions. Take 600 mg in the morning 300 mg at lunch time and 900 mg at bedtime   hydrOXYzine (ATARAX) 10 MG tablet Take 0.5-2 tablets (5-20 mg total) by mouth 3 (three) times daily as needed for anxiety (not with Loratadine).   lidocaine (LIDODERM) 5 % Place 1 patch onto the skin daily. Remove & Discard patch within 12 hours or as directed by MD   loratadine (CLARITIN) 10 MG tablet Take 10 mg by mouth daily as needed for rhinitis.   nitroGLYCERIN (NITROSTAT) 0.4 MG SL tablet Place 1 tablet (0.4 mg total) under the tongue every 5 (five) minutes x 3 doses as needed for chest pain.   nystatin cream (MYCOSTATIN) Apply 1 application topically 2 (two) times daily. (Patient taking differently: Apply 1 application  topically daily as needed (vaginal yeast).)   pantoprazole (PROTONIX) 40 MG tablet Take 40 mg by mouth 2 (two) times daily.   pentosan polysulfate (ELMIRON) 100 MG capsule Take 1 capsule (100 mg total) by mouth 3 (three) times daily. Reported on 01/05/2016 (Patient taking differently: Take 100 mg by mouth daily as needed (burning and pain). Reported on 01/05/2016)   polyethylene glycol (MIRALAX) 17 g packet Take 17 g by mouth 2 (two) times daily.   REPATHA SURECLICK 140 MG/ML SOAJ INJECT 140 MG INTO THE SKIN EVERY 14 DAYS   sucralfate (CARAFATE) 1 GM/10ML suspension Take 10 mLs (1 g total) by mouth 4 (four) times daily -  with meals and at bedtime.   traMADol (ULTRAM) 50 MG tablet Take 25 mg by mouth 2 (two) times daily.   traMADol (ULTRAM) 50 MG tablet Take 1 tablet by mouth 3 (three) times daily as needed.   traZODone (DESYREL) 100 MG tablet TAKE 1 TABLET BY MOUTH AT BEDTIME   Vitamin D, Ergocalciferol, (DRISDOL) 1.25 MG (50000 UNIT) CAPS capsule Take 1 capsule (50,000 Units total) by mouth every 7 (seven) days. 12x Weeks   No facility-administered encounter medications on file as of  12/07/2023.    PAST MEDICAL HISTORY: Past Medical History:  Diagnosis Date   Abdominal pain 10/26/2013   Allergic rhinitis 01/25/2016   ANA positive 07/29/2013   Anemia    Arthralgia 08/18/2013   Arthritis    Asthma, allergic 08/05/2012   Atrial flutter    past history- not current   Atypical chest pain 01/01/2015   Bilateral carpal tunnel syndrome 03/04/2015   CAD in native artery 08/25/2010   Minor CAD 2009, 2011,  low risk Myoview 2013 and 2016        Cataract    bilaterally removed    Cellulitis of breast 02/05/2023   Chronic constipation    Chronic cough 01/09/2017   Chronic interstitial cystitis 02/01/2017   Chronic night sweats 01/08/2015   Common bile duct dilatation 02/13/2023   Concussion    x 3   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   DDD (degenerative disc disease), cervical 09/17/2018   Degenerative scoliosis 04/22/2018   Deviated septum 05/12/2016   Dysphagia    Dyspnea    Dysuria 03/09/2021   Elevated alkaline phosphatase level 01/25/2023   Essential hypertension 08/25/2010   External hemorrhoids 02/05/2015   Facial trauma 01/22/2023   Family history of adverse reaction to anesthesia    Family history of malignant hyperthermia    father had this   Fibrocystic breast disease 10/18/2010   Fibromyalgia    Frequency of urination    Generalized anxiety disorder 08/03/2010   GERD (gastroesophageal reflux disease)    Headache 10/13/2013   High serum vitamin D 05/18/2021   History of chronic bronchitis    History of colonic polyp 02/25/2019   History of hiatal hernia    History of tachycardia    CONTROLLED  WITH ATENOLOL   Hyperglycemia 03/08/2021   Hyperkalemia 09/28/2022   Hyperlipidemia    Intertrigo 12/13/2022   Kidney stone 02/26/2019   Knee pain, right 04/30/2015   Left hip pain 04/30/2015   Leg pain 02/24/2011   Qualifier: Diagnosis of   By: Laury Axon DOMyrene Buddy         Leukocytosis 07/05/2022   Local reaction to hymenoptera sting 05/09/2023    Low back pain 09/08/2020   Major depressive disorder 02/05/2013   Malignant neoplasm of lower-outer quadrant of right breast of female, estrogen receptor positive 01/03/2013   Nasal turbinate hypertrophy 05/12/2016   Oral herpes 02/05/2023   OSA and COPD overlap syndrome 06/10/2020   Osteoporosis 01/2019   T score -2.2 stable/improved from prior study   Pelvic pain    Peripheral neuropathy 05/22/2014   Personal history of radiation therapy    Pneumonia    Primary insomnia 06/25/2017   Pulmonary nodules 02/12/2015   Rash and other nonspecific skin eruption 05/09/2023   Recurrent falls 05/02/2020   Recurrent sinusitis 02/25/2019   Right arm pain 07/20/2016   RLS (restless legs syndrome) 11/09/2019   RUQ pain 07/05/2022   S/P radiation therapy 11/12/12 - 12/05/12   right Breast   Sciatica 04/30/2015   Sepsis 2014   from UTI    Sjogren's disease 12/12/2021   Splenic lesion 09/02/2012   Sprain of lateral ligament of ankle joint 07/31/2023   Stage 3 chronic kidney disease    Statin intolerance 02/29/2020   Stricture and stenosis of esophagus 10/19/2011   Tongue lesion 02/05/2023   Tremor 07/05/2022   Urgency of urination    Vertigo 01/22/2023   Vitamin B12 deficiency 03/08/2021   Vocal cord cyst 05/18/2021    PAST SURGICAL HISTORY: Past Surgical History:  Procedure Laterality Date   60 HOUR PH STUDY N/A 04/03/2016   Procedure: 24 HOUR PH STUDY;  Surgeon: Ruffin Frederick, MD;  Location: [facility];  Service: Gastroenterology;  Laterality: N/A;   BREAST BIOPSY Right 08/23/2012   ADH   BREAST BIOPSY Right 10/11/2012   Ductal Carcinoma   BREAST EXCISIONAL BIOPSY Right 09/04/2013   benign   BREAST LUMPECTOMY Right 10/11/2012   W/ [facility]N BX   CARDIAC CATHETERIZATION  09-13-2007  DR Riley Kill   WELL-PRESERVED LVF/  DIFFUSE SCATTERED CORONARY CALCIFACATION AND ATHEROSCLEROSIS WITHOUT OBSTRUCTION   CARDIAC CATHETERIZATION  08-04-2010  DR Clifton James   NON-OBSTRUCTIVE CAD/   pLAD 40%/  oLAD 30%/  mLAD 30%/  pRCA 30%/  EF 60%   CARDIOVASCULAR STRESS TEST  06-18-2012  DR McALHANY   LOW RISK NUCLEAR STUDY/  SMALL FIXED AREA OF MODERATELY DECREASED UPTAKE IN ANTEROSEPTAL WALL WHICH MAY BE ARTIFACTUAL/  NO ISCHEMIA/  EF 68%   COLONOSCOPY  09/29/2010   CYSTOSCOPY     CYSTOSCOPY WITH HYDRODISTENSION AND BIOPSY N/A 03/06/2014   Procedure: CYSTOSCOPY/HYDRODISTENSION/ INSTILATION OF MARCAINE AND PYRIDIUM;  Surgeon: Kathi Ludwig, MD;  Location: [facility];  Service: Urology;  Laterality: N/A;   CYSTOSCOPY/URETEROSCOPY/HOLMIUM LASER/STENT PLACEMENT Left 03/21/2023   Procedure: CYSTOSCOPY LEFT RETROGRADE PYELOGRAM, LEFT URETEROSCOPY, HOLMIUM LASER LITHOTRIPSYM, AND LEFT URETERAL STENT PLACEMENT;  Surgeon: Rene Paci, MD;  Location: [facility];  Service: Urology;  Laterality: Left;  60 MINUTES   ESOPHAGEAL MANOMETRY N/A 04/03/2016   Procedure: ESOPHAGEAL MANOMETRY (EM);  Surgeon: Ruffin Frederick, MD;  Location: [facility];  Service: Gastroenterology;  Laterality: N/A;   EXTRACORPOREAL SHOCK WAVE LITHOTRIPSY Left 02/06/2019   Procedure: EXTRACORPOREAL SHOCK WAVE LITHOTRIPSY (ESWL);  Surgeon: Ihor Gully, MD;  Location: [facility];  Service: Urology;  Laterality: Left;   NASAL SINUS SURGERY  1985   ORIF RIGHT ANKLE  FX  2006   POLYPECTOMY     REMOVAL VOCAL CORD CYST  01/2013   RIGHT BREAST BX  08/23/2012   RIGHT HAND SURGERY  X3  LAST ONE 2009   INCLUDES  ORIF RIGHT 5TH FINGER AND REVISION TWICE   SKIN CANCER EXCISION     face,arms   TONSILLECTOMY AND ADENOIDECTOMY  AGE 52   TOTAL ABDOMINAL HYSTERECTOMY W/ BILATERAL SALPINGOOPHORECTOMY  1982   W/  APPENDECTOMY   TRANSTHORACIC ECHOCARDIOGRAM  06/24/2012   GRADE I DIASTOLIC DYSFUNCTION/  EF 55-60%/  MILD MR   UPPER GASTROINTESTINAL ENDOSCOPY      ALLERGIES: Allergies  Allergen Reactions   Clindamycin Hcl Shortness Of Breath and Rash   Penicillins Anaphylaxis   Rosuvastatin  Anaphylaxis   Baclofen Rash   Codeine Hives and Nausea Only   Lincomycin Other (See Comments)   Prednisone Rash    Other reaction(s): Rash, Hives - can tolerate if she takes with benadryl    Doxycycline     Skin rash   Erythromycin Hives   Haemophilus Influenzae Other (See Comments)    Local reaction at the site Local reaction at the site   Haemophilus Influenzae Vaccines Other (See Comments)    Local reaction at site   Latex Hives   Levofloxacin     Other reaction(s): sick   Other Other (See Comments)    Father had malignant hyperthermia   Pentazocine Lactate Other (See Comments)    HALLUCINATION   Pneumococcal Vaccine Polyvalent Hives, Swelling and Other (See Comments)    REACTION: redness, swelling, and hives at injection site   Tamoxifen Nausea And Vomiting and Other (See Comments)    HEADACHE Other reaction(s): Headache, Nausea   Fluzone [Influenza Virus Vaccine] Hives    Local reaction at the site    FAMILY HISTORY: Family History  Problem Relation Age of Onset   Rectal cancer Mother    Colon cancer Mother    Pancreatic cancer Mother    Diabetes Mother    Breast cancer Mother 71   Breast cancer Maternal Aunt  breast   Birth defects Maternal Aunt    Irritable bowel syndrome Son    Heart disease Son        CAD, MV replacement   Allergic Disorder Daughter    Diabetes Daughter    Colon cancer Father    Colon polyps Father    Diabetes Father    Stroke Father    Heart disease Father        CHF   Hyperlipidemia Father    Hypertension Father    Arthritis Father    Breast cancer Paternal Aunt    Breast cancer Paternal Aunt    Arthritis Paternal Uncle    Allergic Disorder Daughter    Heart disease Cousin        CAD, had a blood clot after stenting   Esophageal cancer Neg Hx    Stomach cancer Neg Hx     SOCIAL HISTORY: Social History   Tobacco Use   Smoking status: Former    Current packs/day: 0.00    Average packs/day: 1 pack/day for 15.0 years  (15.0 ttl pk-yrs)    Types: Cigarettes    Start date: 05/27/1990    Quit date: 05/27/2005    Years since quitting: 18.5   Smokeless tobacco: Never  Vaping Use   Vaping status: Never Used  Substance Use Topics   Alcohol use: No    Alcohol/week: 0.0 standard drinks of alcohol   Drug use: No   Social History   Social History Narrative   Lives with husband.   Caffeine use: 1/2 cup per day   Exercise-- 2days a week [employer],  Water aerobics, walking       Retired Engineer, civil (consulting)   No dietary restrictions, tries to maintain a heart healthy diet   Very pleasant lady     OBJECTIVE: PHYSICAL EXAM: BP 131/73   Pulse 96   Resp 20   Ht 5' 4.5" (1.638 m)   SpO2 96%   BMI 21.33 kg/m   General: General appearance: Awake and alert. No distress. Cooperative with exam.  Skin: No obvious rash or jaundice. HEENT: Atraumatic. Anicteric. Lungs: Non-labored breathing on room air  Heart: Regular Abdomen: Soft, non tender. Extremities: No edema. No obvious deformity.  Musculoskeletal: No obvious joint swelling. Psych: mildly anxious but improved from prior    Neurological: Mental Status: Alert. Speech fluent.  Cranial Nerves: Visual fields grossly intact.  PERRL. No nystagmus. EOMI. Facial sensation intact bilaterally to fine touch.    Facial muscles symmetric and strong. No ptosis   Hearing grossly intact bilaterally.  No hypophonia. Palate elevates symmetrically. Full strength shoulder shrug bilaterally. Tongue protrusion full and midline. No atrophy or fasciculations. No significant dysarthria  Motor: Tone is normal.  No fasciculations in  extremities.  No atrophy.  Movement lower extremity: Decreased abduction left greater than right of her toes, otherwise normal movement on the rest of the extremities.  DTR 2/4 in both upper extremities, 1/4 bilateral lower extremities  normal response temperature and vibration bilaterally Coordination: Intact finger-to- nose-finger bilaterally.  Romberg negative.  Gait: Able to rise from chair with arms crossed unassisted. Normal, narrow-based gait. Able to tandem walk. Difficulty to walk on toes and heels.     Pertinent labs: ANA positive, normal CMP and lipid panel, iron 27, TIBC 307, percentage saturation 9, ferritin 8, vitamin D 41, B12 256, hemoglobin 11.9 hematocrit 38.4, normal white count and platelets.  Normal differential, A1c 5.5, TSH 2.17  Total time spent reviewing records, interview, history/exam, documentation, and  coordination of care on day of encounter:  60 min     CC: Bradd Canary, MD [address] [place] [address]

## 2023-12-10 ENCOUNTER — Inpatient Hospital Stay (HOSPITAL_BASED_OUTPATIENT_CLINIC_OR_DEPARTMENT_OTHER): Payer: Medicare Other | Admitting: Medical Oncology

## 2023-12-10 ENCOUNTER — Encounter: Payer: Self-pay | Admitting: Medical Oncology

## 2023-12-10 ENCOUNTER — Inpatient Hospital Stay: Payer: Medicare Other | Attending: Hematology & Oncology

## 2023-12-10 VITALS — BP 126/51 | HR 75 | Temp 97.6°F | Resp 18 | Ht 64.0 in | Wt 123.0 lb

## 2023-12-10 DIAGNOSIS — D508 Other iron deficiency anemias: Secondary | ICD-10-CM | POA: Diagnosis not present

## 2023-12-10 DIAGNOSIS — R937 Abnormal findings on diagnostic imaging of other parts of musculoskeletal system: Secondary | ICD-10-CM

## 2023-12-10 DIAGNOSIS — E538 Deficiency of other specified B group vitamins: Secondary | ICD-10-CM | POA: Diagnosis not present

## 2023-12-10 DIAGNOSIS — R930 Abnormal findings on diagnostic imaging of skull and head, not elsewhere classified: Secondary | ICD-10-CM | POA: Insufficient documentation

## 2023-12-10 DIAGNOSIS — M549 Dorsalgia, unspecified: Secondary | ICD-10-CM | POA: Insufficient documentation

## 2023-12-10 DIAGNOSIS — K909 Intestinal malabsorption, unspecified: Secondary | ICD-10-CM | POA: Insufficient documentation

## 2023-12-10 DIAGNOSIS — R7989 Other specified abnormal findings of blood chemistry: Secondary | ICD-10-CM

## 2023-12-10 DIAGNOSIS — E876 Hypokalemia: Secondary | ICD-10-CM | POA: Insufficient documentation

## 2023-12-10 DIAGNOSIS — R899 Unspecified abnormal finding in specimens from other organs, systems and tissues: Secondary | ICD-10-CM

## 2023-12-10 LAB — CMP (CANCER CENTER ONLY)
ALT: 8 U/L (ref 0–44)
AST: 15 U/L (ref 15–41)
Albumin: 3.9 g/dL (ref 3.5–5.0)
Alkaline Phosphatase: 96 U/L (ref 38–126)
Anion gap: 8 (ref 5–15)
BUN: 11 mg/dL (ref 8–23)
CO2: 29 mmol/L (ref 22–32)
Calcium: 9.1 mg/dL (ref 8.9–10.3)
Chloride: 102 mmol/L (ref 98–111)
Creatinine: 1.04 mg/dL — ABNORMAL HIGH (ref 0.44–1.00)
GFR, Estimated: 55 mL/min — ABNORMAL LOW (ref >60)
Glucose, Bld: 88 mg/dL (ref 70–99)
Potassium: 3.4 mmol/L — ABNORMAL LOW (ref 3.5–5.1)
Sodium: 139 mmol/L (ref 135–145)
Total Bilirubin: 0.5 mg/dL (ref <1.2)
Total Protein: 6.5 g/dL (ref 6.5–8.1)

## 2023-12-10 LAB — CBC WITH DIFFERENTIAL (CANCER CENTER ONLY)
Abs Immature Granulocytes: 0.03 10*3/uL (ref 0.00–0.07)
Basophils Absolute: 0.1 10*3/uL (ref 0.0–0.1)
Basophils Relative: 1 %
Eosinophils Absolute: 0.2 10*3/uL (ref 0.0–0.5)
Eosinophils Relative: 2 %
HCT: 42.4 % (ref 36.0–46.0)
Hemoglobin: 13.7 g/dL (ref 12.0–15.0)
Immature Granulocytes: 0 %
Lymphocytes Relative: 21 %
Lymphs Abs: 1.7 10*3/uL (ref 0.7–4.0)
MCH: 29.7 pg (ref 26.0–34.0)
MCHC: 32.3 g/dL (ref 30.0–36.0)
MCV: 91.8 fL (ref 80.0–100.0)
Monocytes Absolute: 0.7 10*3/uL (ref 0.1–1.0)
Monocytes Relative: 8 %
Neutro Abs: 5.3 10*3/uL (ref 1.7–7.7)
Neutrophils Relative %: 68 %
Platelet Count: 263 10*3/uL (ref 150–400)
RBC: 4.62 MIL/uL (ref 3.87–5.11)
RDW: 16 % — ABNORMAL HIGH (ref 11.5–15.5)
WBC Count: 8 10*3/uL (ref 4.0–10.5)
nRBC: 0 % (ref 0.0–0.2)

## 2023-12-10 LAB — VITAMIN B12: Vitamin B-12: 343 pg/mL (ref 180–914)

## 2023-12-10 LAB — SAVE SMEAR(SSMR), FOR PROVIDER SLIDE REVIEW

## 2023-12-10 LAB — LACTATE DEHYDROGENASE: LDH: 158 U/L (ref 98–192)

## 2023-12-10 NOTE — Assessment & Plan Note (Signed)
Struggles with daily pain, stable on current meds, no changes

## 2023-12-10 NOTE — Assessment & Plan Note (Signed)
Statin intolerant 

## 2023-12-10 NOTE — Progress Notes (Signed)
[facility] at [facility] [phone] [phone]  Patient Care Team: Bradd Canary, MD as PCP - General (Family Medicine) Jens Som Madolyn Frieze, MD as PCP - Cardiology (Cardiology) Cherlyn Roberts, MD as Consulting Physician (Dermatology) Leslye Peer, MD as Consulting Physician (Pulmonary Disease) Magrinat, Valentino Hue, MD (Inactive) as Consulting Physician (Oncology) Lewayne Bunting, MD as Consulting Physician (Cardiology) Armbruster, Willaim Rayas, MD as Consulting Physician (Gastroenterology) Sheran Luz, MD as Consulting Physician (Physical Medicine and Rehabilitation) Fontaine, Nadyne Coombes, MD (Inactive) as Consulting Physician (Gynecology) Osborn Coho, MD (Inactive) as Consulting Physician (Otolaryngology) Durene Romans, MD as Consulting Physician (Orthopedic Surgery) Ihor Gully, MD (Inactive) as Consulting Physician (Urology) Rossie Muskrat, MD (Rheumatology)   Name of the patient: [name]  [id number]  1946-12-18   Oncological History: IDA secondary to inadequate dietary intake Low B12 level  Current Treatment:  IV iron- Feraheme- started on 09/27/2023. S/p 2 doses oral iron ineffective   Previous Work Up:  EGD and colonoscopy in 02/2022. EGD showed a 1 cm hiatal hernia. Fecal Occult testing 07/2023 negative  Date of visit: 12/10/23  Reason for Consult: [name] is a 77 y.o. female who presents today to discuss a recent MRI which was ordered by [facility] for some back pain that she had been experiencing. This showed a "diffuse heterogeneous marrow signal on T1... without fracture. New since 2023. Red marrow reconversion in the setting of anemia of chronic disease (diffuse benign hemopoietic marrow hyperplasia) is favored however multiple myeloma or other marrow proliferative process should be excluded via appropriate serological and medical workup.   This area is very painful for patient. She is seen by  orthopedics and pain management. Currently she is taking Tramadol BID. This helps take the edge off. No injury or sudden worsening of symptoms. She does reports that the pain has been worse from previous chronic value for about 6 months. No new incontinence or significant neuro changes.   Denies recent fevers or illnesses. Denies any easy bleeding or bruising. Reports good appetite and denies weight loss. Denies chest pain. Denies any nausea, vomiting, constipation, or diarrhea. Denies urinary complaints. Patient offers no further specific complaints today.  Wt Readings from Last 3 Encounters:  12/10/23 123 lb (55.8 kg)  12/05/23 126 lb 3.2 oz (57.2 kg)  11/29/23 125 lb (56.7 kg)   PAST MEDICAL HISTORY: Past Medical History:  Diagnosis Date   Abdominal pain 10/26/2013   Allergic rhinitis 01/25/2016   ANA positive 07/29/2013   Anemia    Arthralgia 08/18/2013   Arthritis    Asthma, allergic 08/05/2012   Atrial flutter    past history- not current   Atypical chest pain 01/01/2015   Bilateral carpal tunnel syndrome 03/04/2015   CAD in native artery 08/25/2010   Minor CAD 2009, 2011, low risk Myoview 2013 and 2016        Cataract    bilaterally removed    Cellulitis of breast 02/05/2023   Chronic constipation    Chronic cough 01/09/2017   Chronic interstitial cystitis 02/01/2017   Chronic night sweats 01/08/2015   Common bile duct dilatation 02/13/2023   Concussion    x 3   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   DDD (degenerative disc disease), cervical 09/17/2018   Degenerative scoliosis 04/22/2018   Deviated septum 05/12/2016   Dysphagia    Dyspnea    Dysuria 03/09/2021   Elevated alkaline phosphatase level 01/25/2023   Essential hypertension 08/25/2010   External  hemorrhoids 02/05/2015   Facial trauma 01/22/2023   Family history of adverse reaction to anesthesia    Family history of malignant hyperthermia    father had this   Fibrocystic breast disease 10/18/2010    Fibromyalgia    Frequency of urination    Generalized anxiety disorder 08/03/2010   GERD (gastroesophageal reflux disease)    Headache 10/13/2013   High serum vitamin D 05/18/2021   History of chronic bronchitis    History of colonic polyp 02/25/2019   History of hiatal hernia    History of tachycardia    CONTROLLED  WITH ATENOLOL   Hyperglycemia 03/08/2021   Hyperkalemia 09/28/2022   Hyperlipidemia    Intertrigo 12/13/2022   Kidney stone 02/26/2019   Knee pain, right 04/30/2015   Left hip pain 04/30/2015   Leg pain 02/24/2011   Qualifier: Diagnosis of   By: Laury Axon DOMyrene Buddy         Leukocytosis 07/05/2022   Local reaction to hymenoptera sting 05/09/2023   Low back pain 09/08/2020   Major depressive disorder 02/05/2013   Malignant neoplasm of lower-outer quadrant of right breast of female, estrogen receptor positive 01/03/2013   Nasal turbinate hypertrophy 05/12/2016   Oral herpes 02/05/2023   OSA and COPD overlap syndrome 06/10/2020   Osteoporosis 01/2019   T score -2.2 stable/improved from prior study   Pelvic pain    Peripheral neuropathy 05/22/2014   Personal history of radiation therapy    Pneumonia    Primary insomnia 06/25/2017   Pulmonary nodules 02/12/2015   Rash and other nonspecific skin eruption 05/09/2023   Recurrent falls 05/02/2020   Recurrent sinusitis 02/25/2019   Right arm pain 07/20/2016   RLS (restless legs syndrome) 11/09/2019   RUQ pain 07/05/2022   S/P radiation therapy 11/12/12 - 12/05/12   right Breast   Sciatica 04/30/2015   Sepsis 2014   from UTI    Sjogren's disease 12/12/2021   Splenic lesion 09/02/2012   Sprain of lateral ligament of ankle joint 07/31/2023   Stage 3 chronic kidney disease    Statin intolerance 02/29/2020   Stricture and stenosis of esophagus 10/19/2011   Tongue lesion 02/05/2023   Tremor 07/05/2022   Urgency of urination    Vertigo 01/22/2023   Vitamin B12 deficiency 03/08/2021   Vocal cord cyst 05/18/2021     PAST SURGICAL HISTORY:  Past Surgical History:  Procedure Laterality Date   68 HOUR PH STUDY N/A 04/03/2016   Procedure: 24 HOUR PH STUDY;  Surgeon: Ruffin Frederick, MD;  Location: [facility];  Service: Gastroenterology;  Laterality: N/A;   BREAST BIOPSY Right 08/23/2012   ADH   BREAST BIOPSY Right 10/11/2012   Ductal Carcinoma   BREAST EXCISIONAL BIOPSY Right 09/04/2013   benign   BREAST LUMPECTOMY Right 10/11/2012   W/ [facility] BX   CARDIAC CATHETERIZATION  09-13-2007  DR Riley Kill   WELL-PRESERVED LVF/  DIFFUSE SCATTERED CORONARY CALCIFACATION AND ATHEROSCLEROSIS WITHOUT OBSTRUCTION   CARDIAC CATHETERIZATION  08-04-2010  DR MCALHANY   NON-OBSTRUCTIVE CAD/  pLAD 40%/  oLAD 30%/  mLAD 30%/  pRCA 30%/  EF 60%   CARDIOVASCULAR STRESS TEST  06-18-2012  DR McALHANY   LOW RISK NUCLEAR STUDY/  SMALL FIXED AREA OF MODERATELY DECREASED UPTAKE IN ANTEROSEPTAL WALL WHICH MAY BE ARTIFACTUAL/  NO ISCHEMIA/  EF 68%   COLONOSCOPY  09/29/2010   CYSTOSCOPY     CYSTOSCOPY WITH HYDRODISTENSION AND BIOPSY N/A 03/06/2014   Procedure: CYSTOSCOPY/HYDRODISTENSION/ INSTILATION OF MARCAINE AND PYRIDIUM;  Surgeon:  Kathi Ludwig, MD;  Location: [facility];  Service: Urology;  Laterality: N/A;   CYSTOSCOPY/URETEROSCOPY/HOLMIUM LASER/STENT PLACEMENT Left 03/21/2023   Procedure: CYSTOSCOPY LEFT RETROGRADE PYELOGRAM, LEFT URETEROSCOPY, HOLMIUM LASER LITHOTRIPSYM, AND LEFT URETERAL STENT PLACEMENT;  Surgeon: Rene Paci, MD;  Location: [facility];  Service: Urology;  Laterality: Left;  60 MINUTES   ESOPHAGEAL MANOMETRY N/A 04/03/2016   Procedure: ESOPHAGEAL MANOMETRY (EM);  Surgeon: Ruffin Frederick, MD;  Location: [facility];  Service: Gastroenterology;  Laterality: N/A;   EXTRACORPOREAL SHOCK WAVE LITHOTRIPSY Left 02/06/2019   Procedure: EXTRACORPOREAL SHOCK WAVE LITHOTRIPSY (ESWL);  Surgeon: Ihor Gully, MD;  Location: [facility];  Service: Urology;  Laterality: Left;    NASAL SINUS SURGERY  1985   ORIF RIGHT ANKLE  FX  2006   POLYPECTOMY     REMOVAL VOCAL CORD CYST  01/2013   RIGHT BREAST BX  08/23/2012   RIGHT HAND SURGERY  X3  LAST ONE 2009   INCLUDES  ORIF RIGHT 5TH FINGER AND REVISION TWICE   SKIN CANCER EXCISION     face,arms   TONSILLECTOMY AND ADENOIDECTOMY  AGE 51   TOTAL ABDOMINAL HYSTERECTOMY W/ BILATERAL SALPINGOOPHORECTOMY  1982   W/  APPENDECTOMY   TRANSTHORACIC ECHOCARDIOGRAM  06/24/2012   GRADE I DIASTOLIC DYSFUNCTION/  EF 55-60%/  MILD MR   UPPER GASTROINTESTINAL ENDOSCOPY      HEMATOLOGY/ONCOLOGY HISTORY:  Oncology History  Breast cancer, right breast (HCC) (Resolved)  10/11/2012 Definitive Surgery   Right lumpectomy: DCIS, low grade, ER+, PR+, negative margins   10/11/2012 Pathologic Stage   Stage 0: Tis N0    - 12/05/2012 Radiation Therapy   Adjuvant RT:   11/2012 - 02/2013 Anti-estrogen oral therapy   Tamoxifen 20 mg; poorly tolerated and discontinued     ALLERGIES:  is allergic to clindamycin hcl, penicillins, rosuvastatin, baclofen, codeine, erythromycin, latex, lincomycin, pentazocine lactate, pneumococcal vaccine polyvalent, prednisone, tamoxifen, haemophilus influenzae, haemophilus influenzae vaccines, levofloxacin, other, doxycycline, and fluzone [influenza virus vaccine].  MEDICATIONS:  Current Outpatient Medications  Medication Sig Dispense Refill   acetaminophen (TYLENOL) 500 MG tablet Take 1,000 mg by mouth every 6 (six) hours as needed for mild pain or headache.      albuterol (VENTOLIN HFA) 108 (90 Base) MCG/ACT inhaler Inhale 2 puffs into the lungs every 4 (four) hours as needed for wheezing or shortness of breath (coughing fits). 18 g 1   amLODipine (NORVASC) 5 MG tablet Take 5 mg by mouth daily.     aspirin EC 81 MG tablet Take 1 tablet (81 mg total) by mouth daily. 90 tablet 3   atenolol (TENORMIN) 25 MG tablet Take 1 tablet (25 mg total) by mouth 2 (two) times daily. Take 1 tablet by mouth 2 time a day  180 tablet 3   CALCIUM PO Take 500 mg by mouth daily.     carboxymethylcellul-glycerin (OPTIVE) 0.5-0.9 % ophthalmic solution Place 1 drop into both eyes daily as needed for dry eyes.     Cholecalciferol (VITAMIN D3) 50 MCG (2000 UT) CAPS Take 2,000 Units by mouth daily.     cyanocobalamin (VITAMIN B12) 1000 MCG tablet Take 1,000 mg by mouth daily with breakfast.     diclofenac Sodium (VOLTAREN) 1 % GEL Apply 2 g topically daily as needed (Back pain).     Docusate Sodium (DSS) 100 MG CAPS Take 100 mg by mouth daily as needed (Constipation).     DULoxetine (CYMBALTA) 60 MG capsule Take 1 capsule by mouth twice daily  180 capsule 0   fluticasone (FLONASE) 50 MCG/ACT nasal spray Use 2 spray(s) in each nostril once daily (Patient taking differently: Place 2 sprays into both nostrils daily as needed for allergies or rhinitis.) 16 g 5   fluticasone (FLOVENT HFA) 110 MCG/ACT inhaler Inhale 2 puffs into the lungs 2 (two) times daily. 1 each 5   folic acid (FOLVITE) 1 MG tablet Take 1 tablet (1 mg total) by mouth daily.     furosemide (LASIX) 20 MG tablet TAKE 1 TABLET BY MOUTH AS NEEDED FOR EDEMA 90 tablet 3   gabapentin (NEURONTIN) 300 MG capsule Take 300-900 mg by mouth See admin instructions. Take 600 mg in the morning 300 mg at lunch time and 900 mg at bedtime     hydrOXYzine (ATARAX) 10 MG tablet Take 0.5-2 tablets (5-20 mg total) by mouth 3 (three) times daily as needed for anxiety (not with Loratadine). 90 tablet 2   lidocaine (LIDODERM) 5 % Place 1 patch onto the skin daily. Remove & Discard patch within 12 hours or as directed by MD 30 patch 0   loratadine (CLARITIN) 10 MG tablet Take 10 mg by mouth daily as needed for rhinitis.     nystatin cream (MYCOSTATIN) Apply 1 application topically 2 (two) times daily. (Patient taking differently: Apply 1 application  topically daily as needed (vaginal yeast).) 30 g 2   pantoprazole (PROTONIX) 40 MG tablet Take 40 mg by mouth 2 (two) times daily.      pentosan polysulfate (ELMIRON) 100 MG capsule Take 1 capsule (100 mg total) by mouth 3 (three) times daily. Reported on 01/05/2016 (Patient taking differently: Take 100 mg by mouth daily as needed (burning and pain). Reported on 01/05/2016) 90 capsule 11   polyethylene glycol (MIRALAX) 17 g packet Take 17 g by mouth 2 (two) times daily. 14 each 0   REPATHA SURECLICK 140 MG/ML SOAJ INJECT 140 MG INTO THE SKIN EVERY 14 DAYS 6 mL 0   sucralfate (CARAFATE) 1 GM/10ML suspension Take 10 mLs (1 g total) by mouth 4 (four) times daily -  with meals and at bedtime. 420 mL 1   traMADol (ULTRAM) 50 MG tablet Take 25 mg by mouth 2 (two) times daily.     traMADol (ULTRAM) 50 MG tablet Take 1 tablet by mouth 3 (three) times daily as needed.     traZODone (DESYREL) 100 MG tablet TAKE 1 TABLET BY MOUTH AT BEDTIME 90 tablet 0   Vitamin D, Ergocalciferol, (DRISDOL) 1.25 MG (50000 UNIT) CAPS capsule Take 1 capsule (50,000 Units total) by mouth every 7 (seven) days. 12x Weeks 5 capsule 3   EPINEPHrine 0.3 mg/0.3 mL IJ SOAJ injection Inject 0.3 mg into the muscle as needed for anaphylaxis. (Patient not taking: Reported on 12/10/2023) 2 each 1   famotidine (PEPCID) 20 MG tablet Take 1 tablet (20 mg total) by mouth 2 (two) times daily as needed for heartburn or indigestion. 60 tablet 1   nitroGLYCERIN (NITROSTAT) 0.4 MG SL tablet Place 1 tablet (0.4 mg total) under the tongue every 5 (five) minutes x 3 doses as needed for chest pain. (Patient not taking: Reported on 12/10/2023) 25 tablet 2   No current facility-administered medications for this visit.    VITAL SIGNS: BP (!) 126/51 (BP Location: Left Arm, Patient Position: Sitting)   Pulse 75   Temp 97.6 F (36.4 C) (Oral)   Resp 18   Ht 5\' 4"  (1.626 m)   Wt 123 lb (55.8 kg)   SpO2  100%   BMI 21.11 kg/m  Filed Weights   12/10/23 1434  Weight: 123 lb (55.8 kg)    Estimated body mass index is 21.11 kg/m as calculated from the following:   Height as of this  encounter: 5\' 4"  (1.626 m).   Weight as of this encounter: 123 lb (55.8 kg).  LABS: CBC:    Component Value Date/Time   WBC 8.0 12/10/2023 1357   WBC 8.0 11/29/2023 0900   HGB 13.7 12/10/2023 1357   HGB 11.8 05/25/2023 1231   HGB 11.3 (L) 04/20/2015 1050   HCT 42.4 12/10/2023 1357   HCT 36.3 05/25/2023 1231   HCT 36.0 04/20/2015 1050   PLT 263 12/10/2023 1357   PLT 348 05/25/2023 1231   MCV 91.8 12/10/2023 1357   MCV 85 05/25/2023 1231   MCV 90.9 04/20/2015 1050   NEUTROABS PENDING 12/10/2023 1357   NEUTROABS 6.2 05/25/2023 1231   NEUTROABS 3.0 04/20/2015 1050   LYMPHSABS PENDING 12/10/2023 1357   LYMPHSABS 2.1 05/25/2023 1231   LYMPHSABS 1.3 04/20/2015 1050   MONOABS PENDING 12/10/2023 1357   MONOABS 0.5 04/20/2015 1050   EOSABS PENDING 12/10/2023 1357   EOSABS 0.5 (H) 05/25/2023 1231   BASOSABS PENDING 12/10/2023 1357   BASOSABS 0.1 05/25/2023 1231   BASOSABS 0.0 04/20/2015 1050   Comprehensive Metabolic Panel:    Component Value Date/Time   NA 141 11/29/2023 0900   NA 138 05/25/2023 1231   NA 140 04/20/2015 1050   K 4.7 11/29/2023 0900   K 3.9 04/20/2015 1050   CL 102 11/29/2023 0900   CL 103 11/12/2012 1549   CO2 34 (H) 11/29/2023 0900   CO2 26 04/20/2015 1050   BUN 16 11/29/2023 0900   BUN 13 05/25/2023 1231   BUN 13.1 04/20/2015 1050   CREATININE 1.14 11/29/2023 0900   CREATININE 1.05 (H) 11/07/2023 0942   CREATININE 1.1 04/20/2015 1050   GLUCOSE 73 11/29/2023 0900   GLUCOSE 82 04/20/2015 1050   GLUCOSE 103 (H) 11/12/2012 1549   CALCIUM 9.4 11/29/2023 0900   CALCIUM 8.9 04/20/2015 1050   AST 16 11/29/2023 0900   AST 20 11/07/2023 0942   AST 23 04/20/2015 1050   ALT 9 11/29/2023 0900   ALT 9 11/07/2023 0942   ALT 16 04/20/2015 1050   ALKPHOS 117 11/29/2023 0900   ALKPHOS 87 04/20/2015 1050   BILITOT 0.3 11/29/2023 0900   BILITOT 0.6 11/07/2023 0942   BILITOT 0.26 04/20/2015 1050   PROT 6.2 11/29/2023 0900   PROT 6.2 11/29/2023 0900   PROT 6.6  05/25/2023 1231   PROT 6.8 04/20/2015 1050   ALBUMIN 3.9 11/29/2023 0900   ALBUMIN 4.1 05/25/2023 1231   ALBUMIN 3.7 04/20/2015 1050    RADIOGRAPHIC STUDIES: No results found.  PERFORMANCE STATUS (ECOG) : 1 - Symptomatic but completely ambulatory  Review of Systems Unless otherwise noted, a complete review of systems is negative.  Physical Exam General: NAD. Using a cane to ambulate  Cardiovascular: regular rate and rhythm Pulmonary: clear ant fields Extremities: no edema, no joint deformities Skin: no rashes Neurological: Weakness but otherwise nonfocal  Encounter Diagnosis  Name Primary?   Abnormal MRI, lumbar spine Yes   Assessment and Plan- Patient is a 77 y.o. female with IDA secondary to malabsorption. Current treatment is PRN IV Feraheme with her last infusion being in October.   Today we reviewed her MRI. Certainly her significant long term IDA could play a role which we discussed. Due to her pain  and neuropathy we elected to proceed forward with a full work up including evaluations for multiple myeloma through blood work, urine, and a bone marrow biopsy. We discussed each of these today with her and her husband. They are agreeable.   RTC 1 month MD for results review and plan (30 minute)  Patient expressed understanding and was in agreement with this plan. She also understands that She can call clinic at any time with any questions, concerns, or complaints.   Thank you for allowing me to participate in the care of this very pleasant patient.    Visit consisted of counseling and education dealing with the complex and emotionally intense issues of symptom management in the setting of serious illness.Greater than 50%  of this time was spent counseling and coordinating care related to the above assessment and plan.  Signed by: Clent Jacks, PA-C

## 2023-12-10 NOTE — Assessment & Plan Note (Signed)
Well controlled, no changes to meds. Encouraged heart healthy diet such as the DASH diet and exercise as tolerated.  °

## 2023-12-10 NOTE — Assessment & Plan Note (Signed)
hgba1c acceptable, minimize simple carbs. Increase exercise as tolerated. Continue current meds 

## 2023-12-10 NOTE — Assessment & Plan Note (Signed)
Supplement and monitor 

## 2023-12-10 NOTE — Assessment & Plan Note (Signed)
Worsening due to persistent pain

## 2023-12-11 ENCOUNTER — Other Ambulatory Visit: Payer: Self-pay | Admitting: Radiology

## 2023-12-11 ENCOUNTER — Ambulatory Visit (INDEPENDENT_AMBULATORY_CARE_PROVIDER_SITE_OTHER): Payer: Medicare Other | Admitting: Family Medicine

## 2023-12-11 VITALS — BP 138/76 | HR 70 | Temp 98.6°F | Resp 18 | Ht 64.0 in | Wt 124.4 lb

## 2023-12-11 DIAGNOSIS — E876 Hypokalemia: Secondary | ICD-10-CM

## 2023-12-11 DIAGNOSIS — R739 Hyperglycemia, unspecified: Secondary | ICD-10-CM

## 2023-12-11 DIAGNOSIS — E785 Hyperlipidemia, unspecified: Secondary | ICD-10-CM

## 2023-12-11 DIAGNOSIS — I10 Essential (primary) hypertension: Secondary | ICD-10-CM | POA: Diagnosis not present

## 2023-12-11 DIAGNOSIS — R937 Abnormal findings on diagnostic imaging of other parts of musculoskeletal system: Secondary | ICD-10-CM

## 2023-12-11 DIAGNOSIS — F418 Other specified anxiety disorders: Secondary | ICD-10-CM

## 2023-12-11 DIAGNOSIS — M2550 Pain in unspecified joint: Secondary | ICD-10-CM

## 2023-12-11 DIAGNOSIS — M5030 Other cervical disc degeneration, unspecified cervical region: Secondary | ICD-10-CM

## 2023-12-11 LAB — KAPPA/LAMBDA LIGHT CHAINS
Kappa free light chain: 29 mg/L — ABNORMAL HIGH (ref 3.3–19.4)
Kappa, lambda light chain ratio: 0.99 (ref 0.26–1.65)
Lambda free light chains: 29.3 mg/L — ABNORMAL HIGH (ref 5.7–26.3)

## 2023-12-11 MED ORDER — TRAZODONE HCL 150 MG PO TABS: 150.0000 mg | ORAL_TABLET | Freq: Every day | ORAL | 3 refills | Status: DC

## 2023-12-11 MED ORDER — DULOXETINE HCL 30 MG PO CPEP: 30.0000 mg | ORAL_CAPSULE | Freq: Every day | ORAL | 3 refills | Status: DC

## 2023-12-11 MED ORDER — DULOXETINE HCL 60 MG PO CPEP: 60.0000 mg | ORAL_CAPSULE | Freq: Every morning | ORAL | 1 refills | Status: DC

## 2023-12-11 NOTE — Progress Notes (Signed)
Subjective:    Patient ID: [name], female    DOB: 11-Oct-1946, 77 y.o.   MRN: [id number]  Chief Complaint  Patient presents with   Follow-up    3 month    HPI Discussed the use of AI scribe software for clinical note transcription with the patient, who gave verbal consent to proceed.  History of Present Illness          Patient is a 77 yo female in today accompanied by her husband and she continues to struggle with significant pain throughout but especially in her low back. She reports that she had a recent MRI at [facility] and they report that they saw a concerning lesion and she is now set up for a bone marrow biopsy later this week to further evaluate. She is going to return to her pain management doctor there and discuss change in meds since the Tramadol is not helping. No c/o CP/palp/SOB/HA/congestion/fevers/GI or GU c/o. Taking meds as prescribed   Past Medical History:  Diagnosis Date   Abdominal pain 10/26/2013   Allergic rhinitis 01/25/2016   ANA positive 07/29/2013   Anemia    Arthralgia 08/18/2013   Arthritis    Asthma, allergic 08/05/2012   Atrial flutter    past history- not current   Atypical chest pain 01/01/2015   Bilateral carpal tunnel syndrome 03/04/2015   CAD in native artery 08/25/2010   Minor CAD 2009, 2011, low risk Myoview 2013 and 2016        Cataract    bilaterally removed    Cellulitis of breast 02/05/2023   Chronic constipation    Chronic cough 01/09/2017   Chronic interstitial cystitis 02/01/2017   Chronic night sweats 01/08/2015   Common bile duct dilatation 02/13/2023   Concussion    x 3   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   DDD (degenerative disc disease), cervical 09/17/2018   Degenerative scoliosis 04/22/2018   Deviated septum 05/12/2016   Dysphagia    Dyspnea    Dysuria 03/09/2021   Elevated alkaline phosphatase level 01/25/2023   Essential hypertension 08/25/2010   External hemorrhoids 02/05/2015   Facial trauma  01/22/2023   Family history of adverse reaction to anesthesia    Family history of malignant hyperthermia    father had this   Fibrocystic breast disease 10/18/2010   Fibromyalgia    Frequency of urination    Generalized anxiety disorder 08/03/2010   GERD (gastroesophageal reflux disease)    Headache 10/13/2013   High serum vitamin D 05/18/2021   History of chronic bronchitis    History of colonic polyp 02/25/2019   History of hiatal hernia    History of tachycardia    CONTROLLED  WITH ATENOLOL   Hyperglycemia 03/08/2021   Hyperkalemia 09/28/2022   Hyperlipidemia    Intertrigo 12/13/2022   Kidney stone 02/26/2019   Knee pain, right 04/30/2015   Left hip pain 04/30/2015   Leg pain 02/24/2011   Qualifier: Diagnosis of   By: Laury Axon DOMyrene Buddy         Leukocytosis 07/05/2022   Local reaction to hymenoptera sting 05/09/2023   Low back pain 09/08/2020   Major depressive disorder 02/05/2013   Malignant neoplasm of lower-outer quadrant of right breast of female, estrogen receptor positive 01/03/2013   Nasal turbinate hypertrophy 05/12/2016   Oral herpes 02/05/2023   OSA and COPD overlap syndrome 06/10/2020   Osteoporosis 01/2019   T score -2.2 stable/improved from prior study   Pelvic pain  Peripheral neuropathy 05/22/2014   Personal history of radiation therapy    Pneumonia    Primary insomnia 06/25/2017   Pulmonary nodules 02/12/2015   Rash and other nonspecific skin eruption 05/09/2023   Recurrent falls 05/02/2020   Recurrent sinusitis 02/25/2019   Right arm pain 07/20/2016   RLS (restless legs syndrome) 11/09/2019   RUQ pain 07/05/2022   S/P radiation therapy 11/12/12 - 12/05/12   right Breast   Sciatica 04/30/2015   Sepsis 2014   from UTI    Sjogren's disease 12/12/2021   Splenic lesion 09/02/2012   Sprain of lateral ligament of ankle joint 07/31/2023   Stage 3 chronic kidney disease    Statin intolerance 02/29/2020   Stricture and stenosis of esophagus  10/19/2011   Tongue lesion 02/05/2023   Tremor 07/05/2022   Urgency of urination    Vertigo 01/22/2023   Vitamin B12 deficiency 03/08/2021   Vocal cord cyst 05/18/2021    Past Surgical History:  Procedure Laterality Date   24 HOUR PH STUDY N/A 04/03/2016   Procedure: 24 HOUR PH STUDY;  Surgeon: Ruffin Frederick, MD;  Location: [facility];  Service: Gastroenterology;  Laterality: N/A;   BREAST BIOPSY Right 08/23/2012   ADH   BREAST BIOPSY Right 10/11/2012   Ductal Carcinoma   BREAST EXCISIONAL BIOPSY Right 09/04/2013   benign   BREAST LUMPECTOMY Right 10/11/2012   W/ [facility] BX   CARDIAC CATHETERIZATION  09-13-2007  DR Riley Kill   WELL-PRESERVED LVF/  DIFFUSE SCATTERED CORONARY CALCIFACATION AND ATHEROSCLEROSIS WITHOUT OBSTRUCTION   CARDIAC CATHETERIZATION  08-04-2010  DR MCALHANY   NON-OBSTRUCTIVE CAD/  pLAD 40%/  oLAD 30%/  mLAD 30%/  pRCA 30%/  EF 60%   CARDIOVASCULAR STRESS TEST  06-18-2012  DR McALHANY   LOW RISK NUCLEAR STUDY/  SMALL FIXED AREA OF MODERATELY DECREASED UPTAKE IN ANTEROSEPTAL WALL WHICH MAY BE ARTIFACTUAL/  NO ISCHEMIA/  EF 68%   COLONOSCOPY  09/29/2010   CYSTOSCOPY     CYSTOSCOPY WITH HYDRODISTENSION AND BIOPSY N/A 03/06/2014   Procedure: CYSTOSCOPY/HYDRODISTENSION/ INSTILATION OF MARCAINE AND PYRIDIUM;  Surgeon: Kathi Ludwig, MD;  Location: [facility] [place];  Service: Urology;  Laterality: N/A;   CYSTOSCOPY/URETEROSCOPY/HOLMIUM LASER/STENT PLACEMENT Left 03/21/2023   Procedure: CYSTOSCOPY LEFT RETROGRADE PYELOGRAM, LEFT URETEROSCOPY, HOLMIUM LASER LITHOTRIPSYM, AND LEFT URETERAL STENT PLACEMENT;  Surgeon: Rene Paci, MD;  Location: [facility];  Service: Urology;  Laterality: Left;  60 MINUTES   ESOPHAGEAL MANOMETRY N/A 04/03/2016   Procedure: ESOPHAGEAL MANOMETRY (EM);  Surgeon: Ruffin Frederick, MD;  Location: [facility];  Service: Gastroenterology;  Laterality: N/A;   EXTRACORPOREAL SHOCK WAVE LITHOTRIPSY Left 02/06/2019    Procedure: EXTRACORPOREAL SHOCK WAVE LITHOTRIPSY (ESWL);  Surgeon: Ihor Gully, MD;  Location: [facility];  Service: Urology;  Laterality: Left;   NASAL SINUS SURGERY  1985   ORIF RIGHT ANKLE  FX  2006   POLYPECTOMY     REMOVAL VOCAL CORD CYST  01/2013   RIGHT BREAST BX  08/23/2012   RIGHT HAND SURGERY  X3  LAST ONE 2009   INCLUDES  ORIF RIGHT 5TH FINGER AND REVISION TWICE   SKIN CANCER EXCISION     face,arms   TONSILLECTOMY AND ADENOIDECTOMY  AGE 3   TOTAL ABDOMINAL HYSTERECTOMY W/ BILATERAL SALPINGOOPHORECTOMY  1982   W/  APPENDECTOMY   TRANSTHORACIC ECHOCARDIOGRAM  06/24/2012   GRADE I DIASTOLIC DYSFUNCTION/  EF 55-60%/  MILD MR   UPPER GASTROINTESTINAL ENDOSCOPY      Family History  Problem  Relation Age of Onset   Rectal cancer Mother    Colon cancer Mother    Pancreatic cancer Mother    Diabetes Mother    Breast cancer Mother 62   Breast cancer Maternal Aunt        breast   Birth defects Maternal Aunt    Irritable bowel syndrome Son    Heart disease Son        CAD, MV replacement   Allergic Disorder Daughter    Diabetes Daughter    Colon cancer Father    Colon polyps Father    Diabetes Father    Stroke Father    Heart disease Father        CHF   Hyperlipidemia Father    Hypertension Father    Arthritis Father    Breast cancer Paternal Aunt    Breast cancer Paternal Aunt    Arthritis Paternal Uncle    Allergic Disorder Daughter    Heart disease Cousin        CAD, had a blood clot after stenting   Esophageal cancer Neg Hx    Stomach cancer Neg Hx     Social History   Socioeconomic History   Marital status: Married    Spouse name: Windy Fast    Number of children: 3   Years of education: 16   Highest education level: Bachelor's degree (e.g., BA, AB, BS)  Occupational History   Occupation: Retired    Comment: Charity fundraiser  Tobacco Use   Smoking status: Former    Current packs/day: 0.00    Average packs/day: 1 pack/day for 15.0 years (15.0 ttl pk-yrs)    Types:  Cigarettes    Start date: 05/27/1990    Quit date: 05/27/2005    Years since quitting: 18.5   Smokeless tobacco: Never  Vaping Use   Vaping status: Never Used  Substance and Sexual Activity   Alcohol use: No    Alcohol/week: 0.0 standard drinks of alcohol   Drug use: No   Sexual activity: Not Currently    Partners: Male    Birth control/protection: Surgical    Comment: 1st intercourse 77 yo-Fewer than 5 partners, hysterectomy  Other Topics Concern   Not on file  Social History Narrative   Lives with husband.   Caffeine use: 1/2 cup per day   Exercise-- 2days a week [employer],  Water aerobics, walking       Retired Engineer, civil (consulting)   No dietary restrictions, tries to maintain a heart healthy diet   Very pleasant lady   Social Drivers of Corporate investment banker Strain: Low Risk  (12/07/2023)   Overall Financial Resource Strain (CARDIA)    Difficulty of Paying Living Expenses: Not hard at all  Food Insecurity: No Food Insecurity (12/07/2023)   Hunger Vital Sign    Worried About Running Out of Food in the Last Year: Never true    Ran Out of Food in the Last Year: Never true  Transportation Needs: No Transportation Needs (12/07/2023)   PRAPARE - Administrator, Civil Service (Medical): No    Lack of Transportation (Non-Medical): No  Physical Activity: Inactive (12/07/2023)   Exercise Vital Sign    Days of Exercise per Week: 0 days    Minutes of Exercise per Session: 20 min  Stress: No Stress Concern Present (12/07/2023)   [employer] of Occupational Health - Occupational Stress Questionnaire    Feeling of Stress : Only a little  Recent Concern: Stress - Stress Concern  Present (10/20/2023)   [employer] of Occupational Health - Occupational Stress Questionnaire    Feeling of Stress : To some extent  Social Connections: Socially Integrated (12/07/2023)   Social Connection and Isolation Panel [NHANES]    Frequency of Communication with Friends and Family: More than  three times a week    Frequency of Social Gatherings with Friends and Family: Twice a week    Attends Religious Services: More than 4 times per year    Active Member of [employer] or Organizations: Yes    Attends Engineer, structural: More than 4 times per year    Marital Status: Married  Catering manager Violence: Not At Risk (09/12/2023)   Humiliation, Afraid, Rape, and Kick questionnaire    Fear of Current or Ex-Partner: No    Emotionally Abused: No    Physically Abused: No    Sexually Abused: No    Outpatient Medications Prior to Visit  Medication Sig Dispense Refill   acetaminophen (TYLENOL) 500 MG tablet Take 1,000 mg by mouth every 6 (six) hours as needed for mild pain or headache.      albuterol (VENTOLIN HFA) 108 (90 Base) MCG/ACT inhaler Inhale 2 puffs into the lungs every 4 (four) hours as needed for wheezing or shortness of breath (coughing fits). 18 g 1   amLODipine (NORVASC) 5 MG tablet Take 5 mg by mouth daily.     aspirin EC 81 MG tablet Take 1 tablet (81 mg total) by mouth daily. 90 tablet 3   atenolol (TENORMIN) 25 MG tablet Take 1 tablet (25 mg total) by mouth 2 (two) times daily. Take 1 tablet by mouth 2 time a day 180 tablet 3   CALCIUM PO Take 500 mg by mouth daily.     carboxymethylcellul-glycerin (OPTIVE) 0.5-0.9 % ophthalmic solution Place 1 drop into both eyes daily as needed for dry eyes.     Cholecalciferol (VITAMIN D3) 50 MCG (2000 UT) CAPS Take 2,000 Units by mouth daily.     cyanocobalamin (VITAMIN B12) 1000 MCG tablet Take 1,000 mg by mouth daily with breakfast.     diclofenac Sodium (VOLTAREN) 1 % GEL Apply 2 g topically daily as needed (Back pain).     Docusate Sodium (DSS) 100 MG CAPS Take 100 mg by mouth daily as needed (Constipation).     EPINEPHrine 0.3 mg/0.3 mL IJ SOAJ injection Inject 0.3 mg into the muscle as needed for anaphylaxis. 2 each 1   famotidine (PEPCID) 20 MG tablet Take 1 tablet (20 mg total) by mouth 2 (two) times daily as needed  for heartburn or indigestion. 60 tablet 1   fluticasone (FLONASE) 50 MCG/ACT nasal spray Use 2 spray(s) in each nostril once daily (Patient taking differently: Place 2 sprays into both nostrils daily as needed for allergies or rhinitis.) 16 g 5   fluticasone (FLOVENT HFA) 110 MCG/ACT inhaler Inhale 2 puffs into the lungs 2 (two) times daily. 1 each 5   folic acid (FOLVITE) 1 MG tablet Take 1 tablet (1 mg total) by mouth daily.     furosemide (LASIX) 20 MG tablet TAKE 1 TABLET BY MOUTH AS NEEDED FOR EDEMA 90 tablet 3   gabapentin (NEURONTIN) 300 MG capsule Take 300-900 mg by mouth See admin instructions. Take 600 mg in the morning 300 mg at lunch time and 900 mg at bedtime     hydrOXYzine (ATARAX) 10 MG tablet Take 0.5-2 tablets (5-20 mg total) by mouth 3 (three) times daily as needed for anxiety (not  with Loratadine). 90 tablet 2   lidocaine (LIDODERM) 5 % Place 1 patch onto the skin daily. Remove & Discard patch within 12 hours or as directed by MD 30 patch 0   loratadine (CLARITIN) 10 MG tablet Take 10 mg by mouth daily as needed for rhinitis.     nitroGLYCERIN (NITROSTAT) 0.4 MG SL tablet Place 1 tablet (0.4 mg total) under the tongue every 5 (five) minutes x 3 doses as needed for chest pain. 25 tablet 2   nystatin cream (MYCOSTATIN) Apply 1 application topically 2 (two) times daily. (Patient taking differently: Apply 1 application  topically daily as needed (vaginal yeast).) 30 g 2   pantoprazole (PROTONIX) 40 MG tablet Take 40 mg by mouth 2 (two) times daily.     pentosan polysulfate (ELMIRON) 100 MG capsule Take 1 capsule (100 mg total) by mouth 3 (three) times daily. Reported on 01/05/2016 (Patient taking differently: Take 100 mg by mouth daily as needed (burning and pain). Reported on 01/05/2016) 90 capsule 11   polyethylene glycol (MIRALAX) 17 g packet Take 17 g by mouth 2 (two) times daily. 14 each 0   REPATHA SURECLICK 140 MG/ML SOAJ INJECT 140 MG INTO THE SKIN EVERY 14 DAYS 6 mL 0    sucralfate (CARAFATE) 1 GM/10ML suspension Take 10 mLs (1 g total) by mouth 4 (four) times daily -  with meals and at bedtime. 420 mL 1   traMADol (ULTRAM) 50 MG tablet Take 1 tablet by mouth 3 (three) times daily as needed.     Vitamin D, Ergocalciferol, (DRISDOL) 1.25 MG (50000 UNIT) CAPS capsule Take 1 capsule (50,000 Units total) by mouth every 7 (seven) days. 12x Weeks 5 capsule 3   DULoxetine (CYMBALTA) 60 MG capsule Take 1 capsule by mouth twice daily 180 capsule 0   traMADol (ULTRAM) 50 MG tablet Take 25 mg by mouth 2 (two) times daily.     traZODone (DESYREL) 100 MG tablet TAKE 1 TABLET BY MOUTH AT BEDTIME 90 tablet 0   No facility-administered medications prior to visit.    Allergies  Allergen Reactions   Clindamycin Hcl Shortness Of Breath and Rash   Penicillins Anaphylaxis   Rosuvastatin Anaphylaxis   Baclofen Rash   Codeine Hives and Nausea Only   Erythromycin Hives   Latex Hives   Lincomycin Other (See Comments)   Pentazocine Lactate Other (See Comments)    HALLUCINATION   Pneumococcal Vaccine Polyvalent Hives, Swelling and Other (See Comments)    REACTION: swelling at injection site   Prednisone Rash    Other reaction(s): Rash, Hives - can tolerate if she takes with benadryl    Tamoxifen Nausea And Vomiting   Haemophilus Influenzae Other (See Comments)    Local reaction at the site    Haemophilus Influenzae Vaccines Other (See Comments)    Local reaction at site   Levofloxacin     Other reaction(s): sick   Other Other (See Comments)    Father had malignant hyperthermia   Doxycycline Rash   Fluzone [Influenza Virus Vaccine] Hives    Local reaction at the site    Review of Systems  Constitutional:  Positive for malaise/fatigue. Negative for fever.  HENT:  Negative for congestion.   Eyes:  Negative for blurred vision.  Respiratory:  Negative for shortness of breath.   Cardiovascular:  Negative for chest pain, palpitations and leg swelling.  Gastrointestinal:   Negative for abdominal pain, blood in stool and nausea.  Genitourinary:  Negative for dysuria and frequency.  Musculoskeletal:  Positive for back pain and myalgias. Negative for falls.  Skin:  Negative for rash.  Neurological:  Negative for dizziness, loss of consciousness and headaches.  Endo/Heme/Allergies:  Negative for environmental allergies.  Psychiatric/Behavioral:  Negative for depression. The patient is not nervous/anxious.        Objective:    Physical Exam Constitutional:      General: She is not in acute distress.    Appearance: Normal appearance. She is well-developed. She is not toxic-appearing.  HENT:     Head: Normocephalic and atraumatic.     Right Ear: External ear normal.     Left Ear: External ear normal.     Nose: Nose normal.  Eyes:     General:        Right eye: No discharge.        Left eye: No discharge.     Conjunctiva/sclera: Conjunctivae normal.  Neck:     Thyroid: No thyromegaly.  Cardiovascular:     Rate and Rhythm: Normal rate and regular rhythm.     Heart sounds: Normal heart sounds. No murmur heard. Pulmonary:     Effort: Pulmonary effort is normal. No respiratory distress.     Breath sounds: Normal breath sounds.  Abdominal:     General: Bowel sounds are normal.     Palpations: Abdomen is soft.     Tenderness: There is no abdominal tenderness. There is no guarding.  Musculoskeletal:        General: Normal range of motion.     Cervical back: Neck supple.  Lymphadenopathy:     Cervical: No cervical adenopathy.  Skin:    General: Skin is warm and dry.  Neurological:     Mental Status: She is alert and oriented to person, place, and time.  Psychiatric:        Mood and Affect: Mood normal.        Behavior: Behavior normal.        Thought Content: Thought content normal.        Judgment: Judgment normal.    BP 138/76 (BP Location: Left Arm, Patient Position: Sitting, Cuff Size: Normal)   Pulse 70   Temp 98.6 F (37 C) (Oral)    Resp 18   Ht 5\' 4"  (1.626 m)   Wt 124 lb 6.4 oz (56.4 kg)   SpO2 97%   BMI 21.35 kg/m  Wt Readings from Last 3 Encounters:  12/11/23 124 lb 6.4 oz (56.4 kg)  12/10/23 123 lb (55.8 kg)  12/05/23 126 lb 3.2 oz (57.2 kg)    Diabetic Foot Exam - Simple   No data filed    Lab Results  Component Value Date   WBC 8.0 12/10/2023   HGB 13.7 12/10/2023   HCT 42.4 12/10/2023   PLT 263 12/10/2023   GLUCOSE 88 12/10/2023   CHOL 131 08/28/2023   TRIG 122.0 08/28/2023   HDL 39.40 08/28/2023   LDLDIRECT 74.0 06/12/2023   LDLCALC 67 08/28/2023   ALT 8 12/10/2023   AST 15 12/10/2023   NA 139 12/10/2023   K 3.4 (L) 12/10/2023   CL 102 12/10/2023   CREATININE 1.04 (H) 12/10/2023   BUN 11 12/10/2023   CO2 29 12/10/2023   TSH 2.17 08/28/2023   INR 1.05 08/03/2010   HGBA1C 5.5 05/28/2023   MICROALBUR <0.7 08/08/2023    Lab Results  Component Value Date   TSH 2.17 08/28/2023   Lab Results  Component Value Date   WBC 8.0 12/10/2023  HGB 13.7 12/10/2023   HCT 42.4 12/10/2023   MCV 91.8 12/10/2023   PLT 263 12/10/2023   Lab Results  Component Value Date   NA 139 12/10/2023   K 3.4 (L) 12/10/2023   CHLORIDE 102 04/20/2015   CO2 29 12/10/2023   GLUCOSE 88 12/10/2023   BUN 11 12/10/2023   CREATININE 1.04 (H) 12/10/2023   BILITOT 0.5 12/10/2023   ALKPHOS 96 12/10/2023   AST 15 12/10/2023   ALT 8 12/10/2023   PROT 6.5 12/10/2023   ALBUMIN 3.9 12/10/2023   CALCIUM 9.1 12/10/2023   ANIONGAP 8 12/10/2023   EGFR 50.0 08/15/2023   GFR 46.14 (L) 11/29/2023   Lab Results  Component Value Date   CHOL 131 08/28/2023   Lab Results  Component Value Date   HDL 39.40 08/28/2023   Lab Results  Component Value Date   LDLCALC 67 08/28/2023   Lab Results  Component Value Date   TRIG 122.0 08/28/2023   Lab Results  Component Value Date   CHOLHDL 3 08/28/2023   Lab Results  Component Value Date   HGBA1C 5.5 05/28/2023       Assessment & Plan:   Hypokalemia Assessment & Plan: Supplement and monitor    Hyperglycemia Assessment & Plan: hgba1c acceptable, minimize simple carbs. Increase exercise as tolerated. Continue current meds    Hyperlipidemia, unspecified hyperlipidemia type Assessment & Plan: Statin intolerant   Essential hypertension Assessment & Plan: Well controlled, no changes to meds. Encouraged heart healthy diet such as the DASH diet and exercise as tolerated.     DDD (degenerative disc disease), cervical Assessment & Plan: Struggles with  daily pain   Depression with anxiety Assessment & Plan: Worsening due to persistent pain   Arthralgia, unspecified joint Assessment & Plan: With significant back pain and the possibility of bony lesions will request records with her recent MRI to evaluate   Other orders -     traZODone HCl; Take 1 tablet (150 mg total) by mouth at bedtime.  Dispense: 30 tablet; Refill: 3 -     DULoxetine HCl; Take 1 capsule (60 mg total) by mouth every morning.  Dispense: 90 capsule; Refill: 1 -     DULoxetine HCl; Take 1 capsule (30 mg total) by mouth daily at 12 noon.  Dispense: 30 capsule; Refill: 3    Assessment and Plan              Danise Edge, MD

## 2023-12-11 NOTE — Patient Instructions (Signed)
 Cognitive Behavioral Therapy - Insomnia (CBT-I)

## 2023-12-11 NOTE — Assessment & Plan Note (Addendum)
With significant back pain and the possibility of bony lesions will request records with her recent MRI to evaluate. Spent 55 minutes discussing current state and plan of care with patient

## 2023-12-12 ENCOUNTER — Inpatient Hospital Stay: Payer: Medicare Other

## 2023-12-12 DIAGNOSIS — R899 Unspecified abnormal finding in specimens from other organs, systems and tissues: Secondary | ICD-10-CM

## 2023-12-12 DIAGNOSIS — D508 Other iron deficiency anemias: Secondary | ICD-10-CM | POA: Diagnosis not present

## 2023-12-12 DIAGNOSIS — K909 Intestinal malabsorption, unspecified: Secondary | ICD-10-CM | POA: Diagnosis not present

## 2023-12-12 DIAGNOSIS — R937 Abnormal findings on diagnostic imaging of other parts of musculoskeletal system: Secondary | ICD-10-CM

## 2023-12-12 DIAGNOSIS — R930 Abnormal findings on diagnostic imaging of skull and head, not elsewhere classified: Secondary | ICD-10-CM | POA: Diagnosis not present

## 2023-12-12 DIAGNOSIS — E538 Deficiency of other specified B group vitamins: Secondary | ICD-10-CM | POA: Diagnosis not present

## 2023-12-12 DIAGNOSIS — M549 Dorsalgia, unspecified: Secondary | ICD-10-CM | POA: Diagnosis not present

## 2023-12-12 NOTE — Consult Note (Signed)
Chief Complaint: Patient was seen in consultation today for CT-guided bone marrow biopsy  Referring Physician(s): Covington,Sarah M  Supervising Physician: Simonne Come  Patient Status: [facility] - Out-pt  History of Present Illness: [name] is a 77 y.o. female ex smoker with past medical history significant for anemia, arthritis, atrial flutter, coronary artery disease, interstitial cystitis, COPD, degenerative disc disease, hypertension, fibromyalgia, anxiety, GERD, nephrolithiasis, depression, sleep apnea, restless leg syndrome, right breast cancer 2013. She presents now with recent MRI which was ordered by [facility] for some back pain that she had been experiencing. This showed a "diffuse heterogeneous marrow signal on T1... without fracture. New since 2023. Red marrow reconversion in the setting of anemia of chronic disease (diffuse benign hemopoietic marrow hyperplasia) is favored however multiple myeloma or other marrow proliferative process should be excluded .  She is scheduled today for CT-guided bone marrow biopsy for further evaluation.  Past Medical History:  Diagnosis Date   Abdominal pain 10/26/2013   Allergic rhinitis 01/25/2016   ANA positive 07/29/2013   Anemia    Arthralgia 08/18/2013   Arthritis    Asthma, allergic 08/05/2012   Atrial flutter    past history- not current   Atypical chest pain 01/01/2015   Bilateral carpal tunnel syndrome 03/04/2015   CAD in native artery 08/25/2010   Minor CAD 2009, 2011, low risk Myoview 2013 and 2016        Cataract    bilaterally removed    Cellulitis of breast 02/05/2023   Chronic constipation    Chronic cough 01/09/2017   Chronic interstitial cystitis 02/01/2017   Chronic night sweats 01/08/2015   Common bile duct dilatation 02/13/2023   Concussion    x 3   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   DDD (degenerative disc disease), cervical 09/17/2018   Degenerative scoliosis 04/22/2018   Deviated septum  05/12/2016   Dysphagia    Dyspnea    Dysuria 03/09/2021   Elevated alkaline phosphatase level 01/25/2023   Essential hypertension 08/25/2010   External hemorrhoids 02/05/2015   Facial trauma 01/22/2023   Family history of adverse reaction to anesthesia    Family history of malignant hyperthermia    father had this   Fibrocystic breast disease 10/18/2010   Fibromyalgia    Frequency of urination    Generalized anxiety disorder 08/03/2010   GERD (gastroesophageal reflux disease)    Headache 10/13/2013   High serum vitamin D 05/18/2021   History of chronic bronchitis    History of colonic polyp 02/25/2019   History of hiatal hernia    History of tachycardia    CONTROLLED  WITH ATENOLOL   Hyperglycemia 03/08/2021   Hyperkalemia 09/28/2022   Hyperlipidemia    Intertrigo 12/13/2022   Kidney stone 02/26/2019   Knee pain, right 04/30/2015   Left hip pain 04/30/2015   Leg pain 02/24/2011   Qualifier: Diagnosis of   By: Laury Axon DOMyrene Buddy         Leukocytosis 07/05/2022   Local reaction to hymenoptera sting 05/09/2023   Low back pain 09/08/2020   Major depressive disorder 02/05/2013   Malignant neoplasm of lower-outer quadrant of right breast of female, estrogen receptor positive 01/03/2013   Nasal turbinate hypertrophy 05/12/2016   Oral herpes 02/05/2023   OSA and COPD overlap syndrome 06/10/2020   Osteoporosis 01/2019   T score -2.2 stable/improved from prior study   Pelvic pain    Peripheral neuropathy 05/22/2014   Personal history of radiation therapy    Pneumonia  Primary insomnia 06/25/2017   Pulmonary nodules 02/12/2015   Rash and other nonspecific skin eruption 05/09/2023   Recurrent falls 05/02/2020   Recurrent sinusitis 02/25/2019   Right arm pain 07/20/2016   RLS (restless legs syndrome) 11/09/2019   RUQ pain 07/05/2022   S/P radiation therapy 11/12/12 - 12/05/12   right Breast   Sciatica 04/30/2015   Sepsis 2014   from UTI    Sjogren's disease 12/12/2021    Splenic lesion 09/02/2012   Sprain of lateral ligament of ankle joint 07/31/2023   Stage 3 chronic kidney disease    Statin intolerance 02/29/2020   Stricture and stenosis of esophagus 10/19/2011   Tongue lesion 02/05/2023   Tremor 07/05/2022   Urgency of urination    Vertigo 01/22/2023   Vitamin B12 deficiency 03/08/2021   Vocal cord cyst 05/18/2021    Past Surgical History:  Procedure Laterality Date   24 HOUR PH STUDY N/A 04/03/2016   Procedure: 24 HOUR PH STUDY;  Surgeon: Ruffin Frederick, MD;  Location: [facility];  Service: Gastroenterology;  Laterality: N/A;   BREAST BIOPSY Right 08/23/2012   ADH   BREAST BIOPSY Right 10/11/2012   Ductal Carcinoma   BREAST EXCISIONAL BIOPSY Right 09/04/2013   benign   BREAST LUMPECTOMY Right 10/11/2012   W/ SLN BX   CARDIAC CATHETERIZATION  09-13-2007  DR Riley Kill   WELL-PRESERVED LVF/  DIFFUSE SCATTERED CORONARY CALCIFACATION AND ATHEROSCLEROSIS WITHOUT OBSTRUCTION   CARDIAC CATHETERIZATION  08-04-2010  DR MCALHANY   NON-OBSTRUCTIVE CAD/  pLAD 40%/  oLAD 30%/  mLAD 30%/  pRCA 30%/  EF 60%   CARDIOVASCULAR STRESS TEST  06-18-2012  DR McALHANY   LOW RISK NUCLEAR STUDY/  SMALL FIXED AREA OF MODERATELY DECREASED UPTAKE IN ANTEROSEPTAL WALL WHICH MAY BE ARTIFACTUAL/  NO ISCHEMIA/  EF 68%   COLONOSCOPY  09/29/2010   CYSTOSCOPY     CYSTOSCOPY WITH HYDRODISTENSION AND BIOPSY N/A 03/06/2014   Procedure: CYSTOSCOPY/HYDRODISTENSION/ INSTILATION OF MARCAINE AND PYRIDIUM;  Surgeon: Kathi Ludwig, MD;  Location: [facility];  Service: Urology;  Laterality: N/A;   CYSTOSCOPY/URETEROSCOPY/HOLMIUM LASER/STENT PLACEMENT Left 03/21/2023   Procedure: CYSTOSCOPY LEFT RETROGRADE PYELOGRAM, LEFT URETEROSCOPY, HOLMIUM LASER LITHOTRIPSYM, AND LEFT URETERAL STENT PLACEMENT;  Surgeon: Rene Paci, MD;  Location: [facility];  Service: Urology;  Laterality: Left;  60 MINUTES   ESOPHAGEAL MANOMETRY N/A 04/03/2016   Procedure:  ESOPHAGEAL MANOMETRY (EM);  Surgeon: Ruffin Frederick, MD;  Location: [facility];  Service: Gastroenterology;  Laterality: N/A;   EXTRACORPOREAL SHOCK WAVE LITHOTRIPSY Left 02/06/2019   Procedure: EXTRACORPOREAL SHOCK WAVE LITHOTRIPSY (ESWL);  Surgeon: Ihor Gully, MD;  Location: [facility];  Service: Urology;  Laterality: Left;   NASAL SINUS SURGERY  1985   ORIF RIGHT ANKLE  FX  2006   POLYPECTOMY     REMOVAL VOCAL CORD CYST  01/2013   RIGHT BREAST BX  08/23/2012   RIGHT HAND SURGERY  X3  LAST ONE 2009   INCLUDES  ORIF RIGHT 5TH FINGER AND REVISION TWICE   SKIN CANCER EXCISION     face,arms   TONSILLECTOMY AND ADENOIDECTOMY  AGE 71   TOTAL ABDOMINAL HYSTERECTOMY W/ BILATERAL SALPINGOOPHORECTOMY  1982   W/  APPENDECTOMY   TRANSTHORACIC ECHOCARDIOGRAM  06/24/2012   GRADE I DIASTOLIC DYSFUNCTION/  EF 55-60%/  MILD MR   UPPER GASTROINTESTINAL ENDOSCOPY      Allergies: Clindamycin hcl, Penicillins, Rosuvastatin, Baclofen, Codeine, Erythromycin, Latex, Lincomycin, Pentazocine lactate, Pneumococcal vaccine polyvalent, Prednisone, Tamoxifen, Haemophilus influenzae, Haemophilus influenzae  vaccines, Levofloxacin, Other, Doxycycline, and Fluzone [influenza virus vaccine]  Medications: Prior to Admission medications   Medication Sig Start Date End Date Taking? Authorizing Provider  acetaminophen (TYLENOL) 500 MG tablet Take 1,000 mg by mouth every 6 (six) hours as needed for mild pain or headache.     [provider]  albuterol (VENTOLIN HFA) 108 (90 Base) MCG/ACT inhaler Inhale 2 puffs into the lungs every 4 (four) hours as needed for wheezing or shortness of breath (coughing fits). 12/05/23   Leslye Peer, MD  amLODipine (NORVASC) 5 MG tablet Take 5 mg by mouth daily. 10/28/23   [provider]  aspirin EC 81 MG tablet Take 1 tablet (81 mg total) by mouth daily. 05/28/14   Kathleene Hazel, MD  atenolol (TENORMIN) 25 MG tablet Take 1 tablet (25 mg total) by mouth 2  (two) times daily. Take 1 tablet by mouth 2 time a day 05/02/23   Lewayne Bunting, MD  CALCIUM PO Take 500 mg by mouth daily.    [provider]  carboxymethylcellul-glycerin (OPTIVE) 0.5-0.9 % ophthalmic solution Place 1 drop into both eyes daily as needed for dry eyes.    [provider]  Cholecalciferol (VITAMIN D3) 50 MCG (2000 UT) CAPS Take 2,000 Units by mouth daily.    [provider]  cyanocobalamin (VITAMIN B12) 1000 MCG tablet Take 1,000 mg by mouth daily with breakfast.    [provider]  diclofenac Sodium (VOLTAREN) 1 % GEL Apply 2 g topically daily as needed (Back pain). 02/25/15   [provider]  Docusate Sodium (DSS) 100 MG CAPS Take 100 mg by mouth daily as needed (Constipation).    [provider]  DULoxetine (CYMBALTA) 30 MG capsule Take 1 capsule (30 mg total) by mouth daily at 12 noon. 12/11/23   Bradd Canary, MD  DULoxetine (CYMBALTA) 60 MG capsule Take 1 capsule (60 mg total) by mouth every morning. 12/11/23   Bradd Canary, MD  EPINEPHrine 0.3 mg/0.3 mL IJ SOAJ injection Inject 0.3 mg into the muscle as needed for anaphylaxis. 05/08/23   Ellamae Sia, DO  famotidine (PEPCID) 20 MG tablet Take 1 tablet (20 mg total) by mouth 2 (two) times daily as needed for heartburn or indigestion. 04/25/23   Bradd Canary, MD  fluticasone (FLONASE) 50 MCG/ACT nasal spray Use 2 spray(s) in each nostril once daily Patient taking differently: Place 2 sprays into both nostrils daily as needed for allergies or rhinitis. 11/15/20   Bradd Canary, MD  fluticasone (FLOVENT HFA) 110 MCG/ACT inhaler Inhale 2 puffs into the lungs 2 (two) times daily. 08/28/23   Bradd Canary, MD  folic acid (FOLVITE) 1 MG tablet Take 1 tablet (1 mg total) by mouth daily. 05/03/23   Sandford Craze, NP  furosemide (LASIX) 20 MG tablet TAKE 1 TABLET BY MOUTH AS NEEDED FOR EDEMA 10/29/23   Lewayne Bunting, MD  gabapentin (NEURONTIN) 300 MG capsule Take 300-900  mg by mouth See admin instructions. Take 600 mg in the morning 300 mg at lunch time and 900 mg at bedtime 09/28/22   Bradd Canary, MD  hydrOXYzine (ATARAX) 10 MG tablet Take 0.5-2 tablets (5-20 mg total) by mouth 3 (three) times daily as needed for anxiety (not with Loratadine). 11/27/23   Bradd Canary, MD  lidocaine (LIDODERM) 5 % Place 1 patch onto the skin daily. Remove & Discard patch within 12 hours or as directed by MD 11/05/23   Hessie Diener,  Irving Burton, DO  loratadine (CLARITIN) 10 MG tablet Take 10 mg by mouth daily as needed for rhinitis.    [provider]  nitroGLYCERIN (NITROSTAT) 0.4 MG SL tablet Place 1 tablet (0.4 mg total) under the tongue every 5 (five) minutes x 3 doses as needed for chest pain. 10/29/23   Bradd Canary, MD  nystatin cream (MYCOSTATIN) Apply 1 application topically 2 (two) times daily. Patient taking differently: Apply 1 application  topically daily as needed (vaginal yeast). 05/17/21   Bradd Canary, MD  pantoprazole (PROTONIX) 40 MG tablet Take 40 mg by mouth 2 (two) times daily. 10/02/23   [provider]  pentosan polysulfate (ELMIRON) 100 MG capsule Take 1 capsule (100 mg total) by mouth 3 (three) times daily. Reported on 01/05/2016 Patient taking differently: Take 100 mg by mouth daily as needed (burning and pain). Reported on 01/05/2016 02/01/17   Ok Edwards, MD  polyethylene glycol (MIRALAX) 17 g packet Take 17 g by mouth 2 (two) times daily. 05/22/23   Benancio Deeds, MD  REPATHA SURECLICK 140 MG/ML SOAJ INJECT 140 MG INTO THE SKIN EVERY 14 DAYS 04/26/23   Bradd Canary, MD  sucralfate (CARAFATE) 1 GM/10ML suspension Take 10 mLs (1 g total) by mouth 4 (four) times daily -  with meals and at bedtime. 08/21/22   Donato Schultz, DO  traMADol (ULTRAM) 50 MG tablet Take 1 tablet by mouth 3 (three) times daily as needed. 09/13/23   [provider]  traZODone (DESYREL) 150 MG tablet Take 1 tablet (150 mg total) by mouth at bedtime.  12/11/23   Bradd Canary, MD  Vitamin D, Ergocalciferol, (DRISDOL) 1.25 MG (50000 UNIT) CAPS capsule Take 1 capsule (50,000 Units total) by mouth every 7 (seven) days. 12x Weeks 01/23/23   Bradd Canary, MD     Family History  Problem Relation Age of Onset   Rectal cancer Mother    Colon cancer Mother    Pancreatic cancer Mother    Diabetes Mother    Breast cancer Mother 35   Breast cancer Maternal Aunt        breast   Birth defects Maternal Aunt    Irritable bowel syndrome Son    Heart disease Son        CAD, MV replacement   Allergic Disorder Daughter    Diabetes Daughter    Colon cancer Father    Colon polyps Father    Diabetes Father    Stroke Father    Heart disease Father        CHF   Hyperlipidemia Father    Hypertension Father    Arthritis Father    Breast cancer Paternal Aunt    Breast cancer Paternal Aunt    Arthritis Paternal Uncle    Allergic Disorder Daughter    Heart disease Cousin        CAD, had a blood clot after stenting   Esophageal cancer Neg Hx    Stomach cancer Neg Hx     Social History   Socioeconomic History   Marital status: Married    Spouse name: Windy Fast    Number of children: 3   Years of education: 16   Highest education level: Bachelor's degree (e.g., BA, AB, BS)  Occupational History   Occupation: Retired    Comment: Charity fundraiser  Tobacco Use   Smoking status: Former    Current packs/day: 0.00    Average packs/day: 1 pack/day for 15.0 years (15.0  ttl pk-yrs)    Types: Cigarettes    Start date: 05/27/1990    Quit date: 05/27/2005    Years since quitting: 18.5   Smokeless tobacco: Never  Vaping Use   Vaping status: Never Used  Substance and Sexual Activity   Alcohol use: No    Alcohol/week: 0.0 standard drinks of alcohol   Drug use: No   Sexual activity: Not Currently    Partners: Male    Birth control/protection: Surgical    Comment: 1st intercourse 77 yo-Fewer than 5 partners, hysterectomy  Other Topics Concern   Not on file   Social History Narrative   Lives with husband.   Caffeine use: 1/2 cup per day   Exercise-- 2days a week [employer],  Water aerobics, walking       Retired Engineer, civil (consulting)   No dietary restrictions, tries to maintain a heart healthy diet   Very pleasant lady   Social Drivers of Corporate investment banker Strain: Low Risk  (12/07/2023)   Overall Financial Resource Strain (CARDIA)    Difficulty of Paying Living Expenses: Not hard at all  Food Insecurity: No Food Insecurity (12/07/2023)   Hunger Vital Sign    Worried About Running Out of Food in the Last Year: Never true    Ran Out of Food in the Last Year: Never true  Transportation Needs: No Transportation Needs (12/07/2023)   PRAPARE - Administrator, Civil Service (Medical): No    Lack of Transportation (Non-Medical): No  Physical Activity: Inactive (12/07/2023)   Exercise Vital Sign    Days of Exercise per Week: 0 days    Minutes of Exercise per Session: 20 min  Stress: No Stress Concern Present (12/07/2023)   [employer] of Occupational Health - Occupational Stress Questionnaire    Feeling of Stress : Only a little  Recent Concern: Stress - Stress Concern Present (10/20/2023)   [employer] of Occupational Health - Occupational Stress Questionnaire    Feeling of Stress : To some extent  Social Connections: Socially Integrated (12/07/2023)   Social Connection and Isolation Panel [NHANES]    Frequency of Communication with Friends and Family: More than three times a week    Frequency of Social Gatherings with Friends and Family: Twice a week    Attends Religious Services: More than 4 times per year    Active Member of [employer] or Organizations: Yes    Attends Engineer, structural: More than 4 times per year    Marital Status: Married      Review of Systems: denies fever,dyspnea, cough, N/V or bleeding; she does have "pain all over"  Vital Signs: Vitals:   12/13/23 0912  BP: (!) 144/96  Pulse: 63   Resp: 18  Temp: 97.7 F (36.5 C)  SpO2: 98%      Code Status: FULL CODE  Advance Care Plan: No documents on file   Physical Exam: awake/alert; chest- distant BS bilat; heart- RRR; abd-soft,+BS,some mild generalized tenderness to palpation; no LE edema  Imaging: No results found.  Labs:  CBC: Recent Labs    11/05/23 1547 11/07/23 0942 11/29/23 0900 12/10/23 1357  WBC 8.7 7.8 8.0 8.0  HGB 13.3 13.5 13.8 13.7  HCT 41.9 43.2 42.2 42.4  PLT 273 252 260.0 263    COAGS: No results for input(s): "INR", "APTT" in the last 8760 hours.  BMP: Recent Labs    06/14/23 1643 08/28/23 1031 11/05/23 1547 11/07/23 0942 11/29/23 0900 12/10/23 1357  NA 135   < >  138 139 141 139  K 3.6   < > 4.2 3.7 4.7 3.4*  CL 100   < > 100 101 102 102  CO2 25   < > 30 29 34* 29  GLUCOSE 147*   < > 99 113* 73 88  BUN 17   < > 13 13 16 11   CALCIUM 8.3*   < > 8.8* 8.7* 9.4 9.1  CREATININE 1.17*   < > 0.95 1.05* 1.14 1.04*  GFRNONAA 48*  --  >60 54*  --  55*   < > = values in this interval not displayed.    LIVER FUNCTION TESTS: Recent Labs    08/28/23 1031 09/04/23 0928 11/07/23 0942 11/29/23 0900 12/10/23 1357  BILITOT 0.5  --  0.6 0.3 0.5  AST 15  --  20 16 15   ALT 8  --  9 9 8   ALKPHOS 118*  --  100 117 96  PROT 6.2 6.2 6.9 6.2  6.2 6.5  ALBUMIN 3.6  --  3.8 3.9 3.9    TUMOR MARKERS: No results for input(s): "AFPTM", "CEA", "CA199", "CHROMGRNA" in the last 8760 hours.  Assessment and Plan: 77 y.o. female ex smoker with past medical history significant for anemia, arthritis, atrial flutter, coronary artery disease, interstitial cystitis, COPD, degenerative disc disease, hypertension, fibromyalgia, anxiety, GERD, nephrolithiasis, depression, sleep apnea, restless leg syndrome, right breast cancer 2013. She presents now with recent MRI which was ordered by [facility] for some back pain that she had been experiencing. This showed a "diffuse heterogeneous marrow signal  on T1... without fracture. New since 2023. Red marrow reconversion in the setting of anemia of chronic disease (diffuse benign hemopoietic marrow hyperplasia) is favored however multiple myeloma or other marrow proliferative process should be excluded .  She is scheduled today for CT-guided bone marrow biopsy for further evaluation.Risks and benefits of procedure was discussed with the patient/spouse  including, but not limited to bleeding, infection, damage to adjacent structures or low yield requiring additional tests.  All of the questions were answered and there is agreement to proceed.  Consent signed and in chart.    Thank you for this interesting consult.  I greatly enjoyed meeting [name] and look forward to participating in their care.  A copy of this report was sent to the requesting provider on this date.  Electronically Signed: D. Jeananne Rama, PA-C 12/12/2023, 9:30 AM   I spent a total of  20 minutes   in face to face in clinical consultation, greater than 50% of which was counseling/coordinating care for CT-guided bone marrow biopsy

## 2023-12-13 ENCOUNTER — Other Ambulatory Visit: Payer: Self-pay

## 2023-12-13 ENCOUNTER — Ambulatory Visit (HOSPITAL_COMMUNITY)
Admission: RE | Admit: 2023-12-13 | Discharge: 2023-12-13 | Disposition: A | Discharge status: Home / Self Care | Payer: Medicare Other | Source: Ambulatory Visit | Attending: Medical Oncology | Admitting: Medical Oncology

## 2023-12-13 ENCOUNTER — Encounter (HOSPITAL_COMMUNITY): Payer: Self-pay

## 2023-12-13 DIAGNOSIS — J4489 Other specified chronic obstructive pulmonary disease: Secondary | ICD-10-CM | POA: Insufficient documentation

## 2023-12-13 DIAGNOSIS — G2581 Restless legs syndrome: Secondary | ICD-10-CM | POA: Diagnosis not present

## 2023-12-13 DIAGNOSIS — Z1379 Encounter for other screening for genetic and chromosomal anomalies: Secondary | ICD-10-CM | POA: Insufficient documentation

## 2023-12-13 DIAGNOSIS — N3010 Interstitial cystitis (chronic) without hematuria: Secondary | ICD-10-CM | POA: Insufficient documentation

## 2023-12-13 DIAGNOSIS — M797 Fibromyalgia: Secondary | ICD-10-CM | POA: Diagnosis not present

## 2023-12-13 DIAGNOSIS — C9000 Multiple myeloma not having achieved remission: Secondary | ICD-10-CM | POA: Diagnosis not present

## 2023-12-13 DIAGNOSIS — I2510 Atherosclerotic heart disease of native coronary artery without angina pectoris: Secondary | ICD-10-CM | POA: Insufficient documentation

## 2023-12-13 DIAGNOSIS — Z87442 Personal history of urinary calculi: Secondary | ICD-10-CM | POA: Insufficient documentation

## 2023-12-13 DIAGNOSIS — I10 Essential (primary) hypertension: Secondary | ICD-10-CM | POA: Diagnosis not present

## 2023-12-13 DIAGNOSIS — F419 Anxiety disorder, unspecified: Secondary | ICD-10-CM | POA: Insufficient documentation

## 2023-12-13 DIAGNOSIS — Z853 Personal history of malignant neoplasm of breast: Secondary | ICD-10-CM | POA: Diagnosis not present

## 2023-12-13 DIAGNOSIS — Z87891 Personal history of nicotine dependence: Secondary | ICD-10-CM | POA: Insufficient documentation

## 2023-12-13 DIAGNOSIS — F32A Depression, unspecified: Secondary | ICD-10-CM | POA: Insufficient documentation

## 2023-12-13 DIAGNOSIS — M1990 Unspecified osteoarthritis, unspecified site: Secondary | ICD-10-CM | POA: Diagnosis not present

## 2023-12-13 DIAGNOSIS — R9389 Abnormal findings on diagnostic imaging of other specified body structures: Secondary | ICD-10-CM | POA: Diagnosis not present

## 2023-12-13 DIAGNOSIS — K219 Gastro-esophageal reflux disease without esophagitis: Secondary | ICD-10-CM | POA: Insufficient documentation

## 2023-12-13 DIAGNOSIS — M899 Disorder of bone, unspecified: Secondary | ICD-10-CM | POA: Insufficient documentation

## 2023-12-13 DIAGNOSIS — R937 Abnormal findings on diagnostic imaging of other parts of musculoskeletal system: Secondary | ICD-10-CM

## 2023-12-13 LAB — CBC WITH DIFFERENTIAL/PLATELET
Abs Immature Granulocytes: 0.04 10*3/uL (ref 0.00–0.07)
Basophils Absolute: 0.1 10*3/uL (ref 0.0–0.1)
Basophils Relative: 1 %
Eosinophils Absolute: 0.4 10*3/uL (ref 0.0–0.5)
Eosinophils Relative: 5 %
HCT: 40.6 % (ref 36.0–46.0)
Hemoglobin: 13.2 g/dL (ref 12.0–15.0)
Immature Granulocytes: 1 %
Lymphocytes Relative: 17 %
Lymphs Abs: 1.2 10*3/uL (ref 0.7–4.0)
MCH: 30.3 pg (ref 26.0–34.0)
MCHC: 32.5 g/dL (ref 30.0–36.0)
MCV: 93.3 fL (ref 80.0–100.0)
Monocytes Absolute: 0.6 10*3/uL (ref 0.1–1.0)
Monocytes Relative: 8 %
Neutro Abs: 4.9 10*3/uL (ref 1.7–7.7)
Neutrophils Relative %: 68 %
Platelets: 235 10*3/uL (ref 150–400)
RBC: 4.35 MIL/uL (ref 3.87–5.11)
RDW: 16 % — ABNORMAL HIGH (ref 11.5–15.5)
WBC: 7.2 10*3/uL (ref 4.0–10.5)
nRBC: 0 % (ref 0.0–0.2)

## 2023-12-13 MED ORDER — MIDAZOLAM HCL 2 MG/2ML IJ SOLN
INTRAMUSCULAR | Status: AC | PRN
  Administered 2023-12-13: .5 mg via INTRAVENOUS

## 2023-12-13 MED ORDER — NALOXONE HCL 0.4 MG/ML IJ SOLN
INTRAMUSCULAR | Status: AC
  Filled 2023-12-13: qty 1

## 2023-12-13 MED ORDER — FENTANYL CITRATE (PF) 100 MCG/2ML IJ SOLN
INTRAMUSCULAR | Status: AC | PRN
  Administered 2023-12-13: 25 ug via INTRAVENOUS

## 2023-12-13 MED ORDER — LIDOCAINE HCL 1 % IJ SOLN
INTRAMUSCULAR | Status: AC | PRN
  Administered 2023-12-13: 10 mL via INTRADERMAL

## 2023-12-13 MED ORDER — SODIUM CHLORIDE 0.9 % IV SOLN: INTRAVENOUS | Status: DC

## 2023-12-13 MED ORDER — FENTANYL CITRATE (PF) 100 MCG/2ML IJ SOLN
INTRAMUSCULAR | Status: AC | PRN
  Administered 2023-12-13: 50 ug via INTRAVENOUS

## 2023-12-13 MED ORDER — MIDAZOLAM HCL 2 MG/2ML IJ SOLN
INTRAMUSCULAR | Status: AC
  Filled 2023-12-13: qty 2

## 2023-12-13 MED ORDER — FLUMAZENIL 0.5 MG/5ML IV SOLN
INTRAVENOUS | Status: AC
  Filled 2023-12-13: qty 5

## 2023-12-13 MED ORDER — MIDAZOLAM HCL 2 MG/2ML IJ SOLN
INTRAMUSCULAR | Status: AC | PRN
  Administered 2023-12-13: 1 mg via INTRAVENOUS

## 2023-12-13 MED ORDER — FENTANYL CITRATE (PF) 100 MCG/2ML IJ SOLN
INTRAMUSCULAR | Status: AC
  Filled 2023-12-13: qty 2

## 2023-12-13 NOTE — Discharge Instructions (Signed)
 Please call Interventional Radiology clinic [phone] with any questions or concerns.  You may remove your dressing and shower tomorrow.  After the procedure, it is common to have: Soreness Bruising Mild pain  Follow these instructions at home:  Medication: Do not use Aspirin or ibuprofen products, such as Advil or Motrin, as it may increase bleeding You may resume your usual medications as ordered by your doctor If your doctor prescribed antibiotics, take them as directed. Do not stop taking them just because you feel better. You need to take the full course of antibiotics  Eating and drinking: Drink plenty of liquids to keep your urine pale yellow You can resume your regular diet as directed by your doctor   Care of the procedure site  Check your biopsy site every day until it heals  Keep the bandage dry. You can take the bandage off and shower tomorrow  If you are bleeding from the biopsy site, apply firm pressure on the area for at least 30 minutes  If directed, apply ice to your biopsy site 2-3 times a day.  o Put ice in a plastic bag  o Place a towel between your skin and the ice bag  o Leave the ice in place for 20 minutes 2-3 times a day  Activity Do not take baths, swim, or use a hot tub until your health care provider approves  Keep all follow-up visits as told by your doctor  Contact your doctor or seek immediate medical care if: You have bright red bleeding from the biopsy site that does not stop after 30 minutes of holding direct pressure on the site. You have signs of infection, such as: Increased pain, swelling, warmth, or redness at the biopsy site Red streaks leading from the biopsy site Yellow or green drainage from the biopsy site A fever (temperature over 100.3F) chills, or both    Moderate Conscious Sedation-Care After  This sheet gives you information about how to care for yourself after your procedure. Your health care provider may also give you  more specific instructions. If you have problems or questions, contact your health care provider.  After the procedure, it is common to have: Sleepiness for several hours. Impaired judgment for several hours. Difficulty with balance. Vomiting if you eat too soon.  Follow these instructions at home:  Rest. Do not participate in activities where you could fall or become injured. Do not drive or use machinery. Do not drink alcohol. Do not take sleeping pills or medicines that cause drowsiness. Do not make important decisions or sign legal documents. Do not take care of children on your own.  Eating and drinking Follow the diet recommended by your health care provider. Drink enough fluid to keep your urine pale yellow. If you vomit: Drink water, juice, or soup when you can drink without vomiting. Make sure you have little or no nausea before eating solid foods.  General instructions Take over-the-counter and prescription medicines only as told by your health care provider. Have a responsible adult stay with you for the time you are told. It is important to have someone help care for you until you are awake and alert. Do not smoke. Keep all follow-up visits as told by your health care provider. This is important.  Contact a health care provider if: You are still sleepy or having trouble with balance after 24 hours. You feel light-headed. You keep feeling nauseous or you keep vomiting. You develop a rash. You have a fever. You  have redness or swelling around the IV site.  Get help right away if: You have trouble breathing. You have new-onset confusion at home.  This information is not intended to replace advice given to you by your health care provider. Make sure you discuss any questions you have with your healthcare provider.

## 2023-12-13 NOTE — Procedures (Signed)
Pre-procedure Diagnosis: Evaluate for multiple myeloma. Post-procedure Diagnosis: Same  Technically successful CT guided bone marrow aspiration and biopsy of left iliac crest.   Complications: None Immediate  EBL: None  Signed: Simonne Come Pager: [phone] 12/13/2023, 11:51 AM

## 2023-12-14 ENCOUNTER — Ambulatory Visit: Payer: Medicare Other | Admitting: Professional

## 2023-12-14 ENCOUNTER — Encounter: Payer: Self-pay | Admitting: Professional

## 2023-12-14 DIAGNOSIS — F411 Generalized anxiety disorder: Secondary | ICD-10-CM | POA: Diagnosis not present

## 2023-12-14 DIAGNOSIS — F331 Major depressive disorder, recurrent, moderate: Secondary | ICD-10-CM | POA: Diagnosis not present

## 2023-12-14 LAB — UPEP/UIFE/LIGHT CHAINS/TP, 24-HR UR
% BETA, Urine: 37.2 %
ALPHA 1 URINE: 4 %
Albumin, U: 15.7 %
Alpha 2, Urine: 13.3 %
Free Kappa Lt Chains,Ur: 102.09 mg/L — ABNORMAL HIGH (ref 1.17–86.46)
Free Kappa/Lambda Ratio: 8.73 (ref 1.83–14.26)
Free Lambda Lt Chains,Ur: 11.69 mg/L (ref 0.27–15.21)
GAMMA GLOBULIN URINE: 29.8 %
Total Protein, Urine-Ur/day: 248 mg/(24.h) — ABNORMAL HIGH (ref 30–150)
Total Protein, Urine: 14.6 mg/dL
Total Volume: 650

## 2023-12-14 NOTE — Progress Notes (Unsigned)
[facility] Counselor/Therapist Progress Note  Patient ID: [name], MRN: [id number],    Date: 12/14/2023  Time Spent: 47 minutes 1159am-1246pm   Treatment Type: Individual Therapy  Risk Assessment: Danger to Self:  No Self-injurious Behavior: No Danger to Others: No  Subjective: Patient arrived late for her in-person session due to having been sent to the wrong location.  Issues addressed: 1-physical -blood work came back cattywompis -has been to rheumatologist, hematologist, and another specialist -she has been having a lot of back issues and the pain medication was not working -she was hurting so bad she swore she would never do back surgery and now she is saying she would definitely do -they did an MRI and they found something but did not tell her what she has but told her to see her hematologist within two days -she had a bone biopsy yesterday and there are red blood cells in her bone marrow and they are suspecting multiple myeloma -she will be seeing an oncologist on the 13th -she was told she may know after Christmas -she is struggling because everything hurts (fingers, hands, elbows) -she has agreed to go on a stronger pain medication -her daughter told her if she doesn't talk to [place] she can do nothing else -pt tearfully shares that she is at peace but she thinks about not wanting to leave [name] -daughter [name] said she goes the worse route and pt countered and said she is thinking more positive   -her daughter's feeling are hurt that she said he would miss [name] and not them   -pt explained that he is her everything   -her son fully supports her and says he knows she is going to be okay and he will continue praying for her   -daughters would do anything for anybody but are living in sin as gay women -pt questions granddaughter going to [employer] with her boyfriend because is it possible for her to get to heaven   -discussed "criteria" to get  into heaven and Catholic people believe the same 2-patient admits she is scared -she admits that she has hurt for so many years that she is okay to go see her momma and daddy and all her loved ones -she thinks that when she goes [name] will not be far behind her -she worries about how her daughter's live -she is jealous that [name]'s partner's mom lives     Treatment Plan:   Goals Alleviate depressive symptoms Recognize, accept, and cope with depressive feelings Develop healthy thinking patterns Develop healthy interpersonal relationships (particularly younger daughter) Reduce overall frequency, intensity, and duration of anxiety Stabilize anxiety level while increasing ability to function Enhance ability to effectively cope with full variety of stressors Learn and implement coping skills that result in a reduction of anxiety    Objectives target date for all objectives is 09/26/2024 Verbalize an understanding of the cognitive, physiological, and behavioral components of anxiety       60 Learning and implement calming skills to reduce overall anxiety           60 Verbalize an understanding of the role that cognitive biases play in excessive irrational worry and persistent anxiety symptoms   70 Identify, challenge, and replace biased fearful self-talk            60 Learn and implement problem solving strategies             70 Identify and engage in pleasant activities  80 Learning and implement personal and interpersonal skills to reduce anxiety and improve interpersonal relationships    60 Learn to accept limitations in life and commit to tolerating, rather than avoiding, unpleasant emotions while accomplishing meaningful goals 60 Identify major life conflicts from the past and present that form the basis for present anxiety       70 Maintain involvement in work, family, and social activities           59 Reestablish a consistent sleep-wake cycle             50 Verbalize an  accurate understanding of depression            60 Identify and replace thoughts that support depression            50 Learn and implement behavioral strategies Verbalize an understanding and resolution of current interpersonal problems Learn and implement decision making skills Learn and implement conflict resolution skills to resolve interpersonal problems Verbalize an understanding of healthy and unhealthy emotions verbalize insight into how past relationships may be influence current experiences with depression Use mindfulness and acceptance strategies and increase value based behavior  Increase hopeful statements about the future.  Interventions Engage the patient in behavioral activation Use instruction, modeling, and role-playing to build the patient 's general social, communication, and/or conflict resolution skills Use Acceptance and Commitment Therapy to help patient  accept uncomfortable realities in order to accomplish value-consistent goals Reinforce the patient 's insight into the role of her past emotional pain and present anxiety  Support the patient in following through with work, family, and social activities Teach and implement sleep hygiene practices  Teach the patient relaxation skills Assign the patient homework Discuss examples demonstrating that unrealistic worry overestimates the probability of threats and underestimates patient's ability  Assist the patient in analyzing his or her worries Help patient understand that avoidance is reinforcing  Consistent with treatment model, discuss how change in cognitive, behavioral, and interpersonal can help patient alleviate depression CBT Behavioral activation to help the patient explore the relationship, nature of the dispute Help the patient develop new interpersonal skills and relationships Conduct Problem solving therapy Teach conflict resolution skills Use a process-experiential approach Conduct ACT  Diagnosis:Major  depressive disorder, recurrent episode, moderate (HCC)  Generalized anxiety disorder  Plan:  -meet on Thursday, January 03, 2024 at 11am in person.

## 2023-12-18 LAB — SURGICAL PATHOLOGY

## 2023-12-19 LAB — MULTIPLE MYELOMA PANEL, SERUM
Albumin SerPl Elph-Mcnc: 3.5 g/dL (ref 2.9–4.4)
Albumin/Glob SerPl: 1.3 (ref 0.7–1.7)
Alpha 1: 0.2 g/dL (ref 0.0–0.4)
Alpha2 Glob SerPl Elph-Mcnc: 0.8 g/dL (ref 0.4–1.0)
B-Globulin SerPl Elph-Mcnc: 0.9 g/dL (ref 0.7–1.3)
Gamma Glob SerPl Elph-Mcnc: 0.8 g/dL (ref 0.4–1.8)
Globulin, Total: 2.7 g/dL (ref 2.2–3.9)
IgA: 167 mg/dL (ref 64–422)
IgG (Immunoglobin G), Serum: 822 mg/dL (ref 586–1602)
IgM (Immunoglobulin M), Srm: 131 mg/dL (ref 26–217)
Total Protein ELP: 6.2 g/dL (ref 6.0–8.5)

## 2023-12-20 ENCOUNTER — Telehealth: Payer: Self-pay

## 2023-12-20 NOTE — Telephone Encounter (Signed)
Received phone call from patient stating she saw her bone biopsy results and was upset as it stated "There wasn't enough tissue" Pt upset and unsure if she will have to have another bone biopsy. Pt upset and crying stating that she can't wait until 01/07/2024 to be seen by a provider. Dr. Myna Hidalgo reviewed pt biopsy result and stated there was nothing to worry about and pt is ok to wait for her appointment. Pt had questions on how we will know if she has myeloma. Pt educated that the MD will use urine, blood and the biopsy to make a determination. Pt continued to cry but was verbalizing understanding and stated she had no further questions.

## 2023-12-21 ENCOUNTER — Encounter (HOSPITAL_COMMUNITY): Payer: Self-pay | Admitting: Medical Oncology

## 2023-12-21 ENCOUNTER — Telehealth: Payer: Self-pay

## 2023-12-21 ENCOUNTER — Other Ambulatory Visit: Payer: Self-pay | Admitting: Family Medicine

## 2023-12-21 DIAGNOSIS — I2510 Atherosclerotic heart disease of native coronary artery without angina pectoris: Secondary | ICD-10-CM

## 2023-12-21 DIAGNOSIS — E785 Hyperlipidemia, unspecified: Secondary | ICD-10-CM

## 2023-12-21 NOTE — Telephone Encounter (Signed)
Called and spoke with patient. A virtual visit appointment has been made for Monday 12/24/23

## 2023-12-21 NOTE — Telephone Encounter (Signed)
Copied from CRM [id number]. Topic: Clinical - Medical Advice >> Dec 21, 2023 10:39 AM Sonny Dandy B wrote: Reason for CRM: Pt called requesting to speak with provider regarding her medical condition,  Pt is requesting a call back at [phone]

## 2023-12-23 NOTE — Assessment & Plan Note (Addendum)
hgba1c acceptable, minimize simple carbs. Increase exercise as tolerated.  

## 2023-12-23 NOTE — Assessment & Plan Note (Signed)
Monitor and report, no changes to meds. Encouraged heart healthy diet such as the DASH diet and exercise as tolerated.

## 2023-12-23 NOTE — Assessment & Plan Note (Signed)
Continues to struggle with back pain, awaiting MRI results from [facility] on back

## 2023-12-23 NOTE — Assessment & Plan Note (Signed)
Hydrate and monitor 

## 2023-12-23 NOTE — Assessment & Plan Note (Signed)
Is working with [employer] now.

## 2023-12-23 NOTE — Assessment & Plan Note (Signed)
Does not tolerate statins.

## 2023-12-24 ENCOUNTER — Encounter: Payer: Self-pay | Admitting: Family Medicine

## 2023-12-24 ENCOUNTER — Telehealth (INDEPENDENT_AMBULATORY_CARE_PROVIDER_SITE_OTHER): Payer: Medicare Other | Admitting: Family Medicine

## 2023-12-24 ENCOUNTER — Encounter: Payer: Self-pay | Admitting: Emergency Medicine

## 2023-12-24 VITALS — BP 150/94 | HR 103

## 2023-12-24 DIAGNOSIS — F418 Other specified anxiety disorders: Secondary | ICD-10-CM

## 2023-12-24 DIAGNOSIS — I10 Essential (primary) hypertension: Secondary | ICD-10-CM

## 2023-12-24 DIAGNOSIS — N183 Chronic kidney disease, stage 3 unspecified: Secondary | ICD-10-CM | POA: Diagnosis not present

## 2023-12-24 DIAGNOSIS — R739 Hyperglycemia, unspecified: Secondary | ICD-10-CM | POA: Diagnosis not present

## 2023-12-24 DIAGNOSIS — M2550 Pain in unspecified joint: Secondary | ICD-10-CM

## 2023-12-24 DIAGNOSIS — R519 Headache, unspecified: Secondary | ICD-10-CM

## 2023-12-24 DIAGNOSIS — M542 Cervicalgia: Secondary | ICD-10-CM | POA: Diagnosis not present

## 2023-12-24 DIAGNOSIS — Z789 Other specified health status: Secondary | ICD-10-CM

## 2023-12-24 NOTE — Assessment & Plan Note (Signed)
Encouraged increased hydration, 64 ounces of clear fluids daily. Minimize alcohol and caffeine. Eat small frequent meals with lean proteins and complex carbs. Avoid high and low blood sugars. Get adequate sleep, 7-8 hours a night. Needs exercise daily preferably in the morning. She is wondering if her meds are

## 2023-12-24 NOTE — Progress Notes (Signed)
MyChart Video Visit    Virtual Visit via Video Note   This patient is at least at moderate risk for complications without adequate follow up. This format is felt to be most appropriate for this patient at this time. Physical exam was limited by quality of the video and audio technology used for the visit. [name], CMA was able to get the patient set up on a video visit.  Patient location: Home Patient and provider in visit Provider location: Office  I discussed the limitations of evaluation and management by telemedicine and the availability of in person appointments. The patient expressed understanding and agreed to proceed.  Visit Date: 12/24/2023  Today's healthcare provider: Danise Edge, MD     Subjective:    Patient ID: [name], female    DOB: 1946-01-20, 77 y.o.   MRN: [id number]  No chief complaint on file.   HPI Discussed the use of AI scribe software for clinical note transcription with the patient, who gave verbal consent to proceed.  History of Present Illness   The patient, with a history of chronic back pain and headaches, presents with worsening symptoms. They report persistent headaches that are present upon waking and persist throughout the day, unrelieved by Tylenol. The patient also describes a significant increase in lethargy, to the point of feeling "drunk" throughout the day. This lethargy is so severe that it limits their daily activities and they often need to lie down after only 10-15 minutes of activity. The patient also reports worsening back pain, which is only relieved when lying down or sitting with their legs elevated. The patient is currently on Trazodone and Tramadol, but believes these medications may be contributing to their symptoms. They have an upcoming MRI for their back pain and are considering dry needling therapy. The patient expresses significant frustration and distress over their current state of health and the lack of a clear  diagnosis or effective treatment plan.        Past Medical History:  Diagnosis Date  . Abdominal pain 10/26/2013  . Allergic rhinitis 01/25/2016  . ANA positive 07/29/2013  . Anemia   . Arthralgia 08/18/2013  . Arthritis   . Asthma, allergic 08/05/2012  . Atrial flutter    past history- not current  . Atypical chest pain 01/01/2015  . Bilateral carpal tunnel syndrome 03/04/2015  . CAD in native artery 08/25/2010   Minor CAD 2009, 2011, low risk Myoview 2013 and 2016       . Cataract    bilaterally removed   . Cellulitis of breast 02/05/2023  . Chronic constipation   . Chronic cough 01/09/2017  . Chronic interstitial cystitis 02/01/2017  . Chronic night sweats 01/08/2015  . Common bile duct dilatation 02/13/2023  . Concussion    x 3  . 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  . DDD (degenerative disc disease), cervical 09/17/2018  . Degenerative scoliosis 04/22/2018  . Deviated septum 05/12/2016  . Dysphagia   . Dyspnea   . Dysuria 03/09/2021  . Elevated alkaline phosphatase level 01/25/2023  . Essential hypertension 08/25/2010  . External hemorrhoids 02/05/2015  . Facial trauma 01/22/2023  . Family history of adverse reaction to anesthesia   . Family history of malignant hyperthermia    father had this  . Fibrocystic breast disease 10/18/2010  . Fibromyalgia   . Frequency of urination   . Generalized anxiety disorder 08/03/2010  . GERD (gastroesophageal reflux disease)   . Headache 10/13/2013  . High serum vitamin  D 05/18/2021  . History of chronic bronchitis   . History of colonic polyp 02/25/2019  . History of hiatal hernia   . History of tachycardia    CONTROLLED  WITH ATENOLOL  . Hyperglycemia 03/08/2021  . Hyperkalemia 09/28/2022  . Hyperlipidemia   . Intertrigo 12/13/2022  . Kidney stone 02/26/2019  . Knee pain, right 04/30/2015  . Left hip pain 04/30/2015  . Leg pain 02/24/2011   Qualifier: Diagnosis of   By: Janit Bern        . Leukocytosis  07/05/2022  . Local reaction to hymenoptera sting 05/09/2023  . Low back pain 09/08/2020  . Major depressive disorder 02/05/2013  . Malignant neoplasm of lower-outer quadrant of right breast of female, estrogen receptor positive 01/03/2013  . Nasal turbinate hypertrophy 05/12/2016  . Oral herpes 02/05/2023  . OSA and COPD overlap syndrome 06/10/2020  . Osteoporosis 01/2019   T score -2.2 stable/improved from prior study  . Pelvic pain   . Peripheral neuropathy 05/22/2014  . Personal history of radiation therapy   . Pneumonia   . Primary insomnia 06/25/2017  . Pulmonary nodules 02/12/2015  . Rash and other nonspecific skin eruption 05/09/2023  . Recurrent falls 05/02/2020  . Recurrent sinusitis 02/25/2019  . Right arm pain 07/20/2016  . RLS (restless legs syndrome) 11/09/2019  . RUQ pain 07/05/2022  . S/P radiation therapy 11/12/12 - 12/05/12   right Breast  . Sciatica 04/30/2015  . Sepsis 2014   from UTI   . Sjogren's disease 12/12/2021  . Splenic lesion 09/02/2012  . Sprain of lateral ligament of ankle joint 07/31/2023  . Stage 3 chronic kidney disease   . Statin intolerance 02/29/2020  . Stricture and stenosis of esophagus 10/19/2011  . Tongue lesion 02/05/2023  . Tremor 07/05/2022  . Urgency of urination   . Vertigo 01/22/2023  . Vitamin B12 deficiency 03/08/2021  . Vocal cord cyst 05/18/2021    Past Surgical History:  Procedure Laterality Date  . 24 HOUR PH STUDY N/A 04/03/2016   Procedure: 24 HOUR PH STUDY;  Surgeon: Ruffin Frederick, MD;  Location: [facility];  Service: Gastroenterology;  Laterality: N/A;  . BREAST BIOPSY Right 08/23/2012   ADH  . BREAST BIOPSY Right 10/11/2012   Ductal Carcinoma  . BREAST EXCISIONAL BIOPSY Right 09/04/2013   benign  . BREAST LUMPECTOMY Right 10/11/2012   W/ SLN BX  . CARDIAC CATHETERIZATION  09-13-2007  DR Riley Kill   WELL-PRESERVED LVF/  DIFFUSE SCATTERED CORONARY CALCIFACATION AND ATHEROSCLEROSIS WITHOUT  OBSTRUCTION  . CARDIAC CATHETERIZATION  08-04-2010  DR Clifton James   NON-OBSTRUCTIVE CAD/  pLAD 40%/  oLAD 30%/  mLAD 30%/  pRCA 30%/  EF 60%  . CARDIOVASCULAR STRESS TEST  06-18-2012  DR McALHANY   LOW RISK NUCLEAR STUDY/  SMALL FIXED AREA OF MODERATELY DECREASED UPTAKE IN ANTEROSEPTAL WALL WHICH MAY BE ARTIFACTUAL/  NO ISCHEMIA/  EF 68%  . COLONOSCOPY  09/29/2010  . CYSTOSCOPY    . CYSTOSCOPY WITH HYDRODISTENSION AND BIOPSY N/A 03/06/2014   Procedure: CYSTOSCOPY/HYDRODISTENSION/ INSTILATION OF MARCAINE AND PYRIDIUM;  Surgeon: Kathi Ludwig, MD;  Location: [facility];  Service: Urology;  Laterality: N/A;  . CYSTOSCOPY/URETEROSCOPY/HOLMIUM LASER/STENT PLACEMENT Left 03/21/2023   Procedure: CYSTOSCOPY LEFT RETROGRADE PYELOGRAM, LEFT URETEROSCOPY, HOLMIUM LASER LITHOTRIPSYM, AND LEFT URETERAL STENT PLACEMENT;  Surgeon: Rene Paci, MD;  Location: [facility];  Service: Urology;  Laterality: Left;  60 MINUTES  . ESOPHAGEAL MANOMETRY N/A 04/03/2016   Procedure: ESOPHAGEAL MANOMETRY (EM);  Surgeon: Ruffin Frederick, MD;  Location: [facility];  Service: Gastroenterology;  Laterality: N/A;  . EXTRACORPOREAL SHOCK WAVE LITHOTRIPSY Left 02/06/2019   Procedure: EXTRACORPOREAL SHOCK WAVE LITHOTRIPSY (ESWL);  Surgeon: Ihor Gully, MD;  Location: [facility];  Service: Urology;  Laterality: Left;  . NASAL SINUS SURGERY  1985  . ORIF RIGHT ANKLE  FX  2006  . POLYPECTOMY    . REMOVAL VOCAL CORD CYST  01/2013  . RIGHT BREAST BX  08/23/2012  . RIGHT HAND SURGERY  X3  LAST ONE 2009   INCLUDES  ORIF RIGHT 5TH FINGER AND REVISION TWICE  . SKIN CANCER EXCISION     face,arms  . TONSILLECTOMY AND ADENOIDECTOMY  AGE 37  . TOTAL ABDOMINAL HYSTERECTOMY W/ BILATERAL SALPINGOOPHORECTOMY  1982   W/  APPENDECTOMY  . TRANSTHORACIC ECHOCARDIOGRAM  06/24/2012   GRADE I DIASTOLIC DYSFUNCTION/  EF 55-60%/  MILD MR  . UPPER GASTROINTESTINAL ENDOSCOPY      Family History  Problem Relation  Age of Onset  . Rectal cancer Mother   . Colon cancer Mother   . Pancreatic cancer Mother   . Diabetes Mother   . Breast cancer Mother 46  . Breast cancer Maternal Aunt        breast  . Birth defects Maternal Aunt   . Irritable bowel syndrome Son   . Heart disease Son        CAD, MV replacement  . Allergic Disorder Daughter   . Diabetes Daughter   . Colon cancer Father   . Colon polyps Father   . Diabetes Father   . Stroke Father   . Heart disease Father        CHF  . Hyperlipidemia Father   . Hypertension Father   . Arthritis Father   . Breast cancer Paternal Aunt   . Breast cancer Paternal Aunt   . Arthritis Paternal Uncle   . Allergic Disorder Daughter   . Heart disease Cousin        CAD, had a blood clot after stenting  . Esophageal cancer Neg Hx   . Stomach cancer Neg Hx     Social History   Socioeconomic History  . Marital status: Married    Spouse name: Windy Fast   . Number of children: 3  . Years of education: 16  . Highest education level: Bachelor's degree (e.g., BA, AB, BS)  Occupational History  . Occupation: Retired    Comment: Charity fundraiser  Tobacco Use  . Smoking status: Former    Current packs/day: 0.00    Average packs/day: 1 pack/day for 15.0 years (15.0 ttl pk-yrs)    Types: Cigarettes    Start date: 05/27/1990    Quit date: 05/27/2005    Years since quitting: 18.5    Passive exposure: Never  . Smokeless tobacco: Never  Vaping Use  . Vaping status: Never Used  Substance and Sexual Activity  . Alcohol use: No    Alcohol/week: 0.0 standard drinks of alcohol  . Drug use: No  . Sexual activity: Not Currently    Partners: Male    Birth control/protection: Surgical    Comment: 1st intercourse 77 yo-Fewer than 5 partners, hysterectomy  Other Topics Concern  . Not on file  Social History Narrative   Lives with husband.   Caffeine use: 1/2 cup per day   Exercise-- 2days a week [employer],  Water aerobics, walking       Retired Engineer, civil (consulting)   No dietary restrictions,  tries to maintain  a heart healthy diet   Very pleasant lady   Social Drivers of Corporate investment banker Strain: Low Risk  (12/07/2023)   Overall Financial Resource Strain (CARDIA)   . Difficulty of Paying Living Expenses: Not hard at all  Food Insecurity: No Food Insecurity (12/07/2023)   Hunger Vital Sign   . Worried About Programme researcher, broadcasting/film/video in the Last Year: Never true   . Ran Out of Food in the Last Year: Never true  Transportation Needs: No Transportation Needs (12/07/2023)   PRAPARE - Transportation   . Lack of Transportation (Medical): No   . Lack of Transportation (Non-Medical): No  Physical Activity: Inactive (12/07/2023)   Exercise Vital Sign   . Days of Exercise per Week: 0 days   . Minutes of Exercise per Session: 20 min  Stress: No Stress Concern Present (12/07/2023)   [employer] of Occupational Health - Occupational Stress Questionnaire   . Feeling of Stress : Only a little  Recent Concern: Stress - Stress Concern Present (10/20/2023)   [employer] of Occupational Health - Occupational Stress Questionnaire   . Feeling of Stress : To some extent  Social Connections: Socially Integrated (12/07/2023)   Social Connection and Isolation Panel [NHANES]   . Frequency of Communication with Friends and Family: More than three times a week   . Frequency of Social Gatherings with Friends and Family: Twice a week   . Attends Religious Services: More than 4 times per year   . Active Member of Clubs or Organizations: Yes   . Attends Banker Meetings: More than 4 times per year   . Marital Status: Married  Catering manager Violence: Not At Risk (09/12/2023)   Humiliation, Afraid, Rape, and Kick questionnaire   . Fear of Current or Ex-Partner: No   . Emotionally Abused: No   . Physically Abused: No   . Sexually Abused: No    Outpatient Medications Prior to Visit  Medication Sig Dispense Refill  . acetaminophen (TYLENOL) 500 MG tablet Take 1,000 mg  by mouth every 6 (six) hours as needed for mild pain or headache.     . albuterol (VENTOLIN HFA) 108 (90 Base) MCG/ACT inhaler Inhale 2 puffs into the lungs every 4 (four) hours as needed for wheezing or shortness of breath (coughing fits). 18 g 1  . amLODipine (NORVASC) 5 MG tablet Take 5 mg by mouth daily.    Marland Kitchen aspirin EC 81 MG tablet Take 1 tablet (81 mg total) by mouth daily. 90 tablet 3  . atenolol (TENORMIN) 25 MG tablet Take 1 tablet (25 mg total) by mouth 2 (two) times daily. Take 1 tablet by mouth 2 time a day 180 tablet 3  . CALCIUM PO Take 500 mg by mouth daily.    . carboxymethylcellul-glycerin (OPTIVE) 0.5-0.9 % ophthalmic solution Place 1 drop into both eyes daily as needed for dry eyes.    . Cholecalciferol (VITAMIN D3) 50 MCG (2000 UT) CAPS Take 2,000 Units by mouth daily.    . ciprofloxacin (CIPRO) 500 MG tablet Take 500 mg by mouth 2 (two) times daily.    . cyanocobalamin (VITAMIN B12) 1000 MCG tablet Take 1,000 mg by mouth daily with breakfast.    . diclofenac Sodium (VOLTAREN) 1 % GEL Apply 2 g topically daily as needed (Back pain).    Tery Sanfilippo Sodium (DSS) 100 MG CAPS Take 100 mg by mouth daily as needed (Constipation).    . DULoxetine (CYMBALTA) 30 MG capsule Take  1 capsule (30 mg total) by mouth daily at 12 noon. 30 capsule 3  . DULoxetine (CYMBALTA) 60 MG capsule Take 1 capsule (60 mg total) by mouth every morning. 90 capsule 1  . EPINEPHrine 0.3 mg/0.3 mL IJ SOAJ injection Inject 0.3 mg into the muscle as needed for anaphylaxis. 2 each 1  . famotidine (PEPCID) 20 MG tablet Take 1 tablet (20 mg total) by mouth 2 (two) times daily as needed for heartburn or indigestion. 60 tablet 1  . fluticasone (FLONASE) 50 MCG/ACT nasal spray Use 2 spray(s) in each nostril once daily (Patient taking differently: Place 2 sprays into both nostrils daily as needed for allergies or rhinitis.) 16 g 5  . fluticasone (FLOVENT HFA) 110 MCG/ACT inhaler Inhale 2 puffs into the lungs 2 (two) times  daily. 1 each 5  . folic acid (FOLVITE) 1 MG tablet Take 1 tablet (1 mg total) by mouth daily.    . furosemide (LASIX) 20 MG tablet TAKE 1 TABLET BY MOUTH AS NEEDED FOR EDEMA 90 tablet 3  . gabapentin (NEURONTIN) 300 MG capsule Take 300-900 mg by mouth See admin instructions. Take 600 mg in the morning 300 mg at lunch time and 900 mg at bedtime    . hydrOXYzine (ATARAX) 10 MG tablet Take 0.5-2 tablets (5-20 mg total) by mouth 3 (three) times daily as needed for anxiety (not with Loratadine). 90 tablet 2  . lidocaine (LIDODERM) 5 % Place 1 patch onto the skin daily. Remove & Discard patch within 12 hours or as directed by MD 30 patch 0  . loratadine (CLARITIN) 10 MG tablet Take 10 mg by mouth daily as needed for rhinitis.    Marland Kitchen nitroGLYCERIN (NITROSTAT) 0.4 MG SL tablet Place 1 tablet (0.4 mg total) under the tongue every 5 (five) minutes x 3 doses as needed for chest pain. 25 tablet 2  . nystatin cream (MYCOSTATIN) Apply 1 application topically 2 (two) times daily. (Patient taking differently: Apply 1 application  topically daily as needed (vaginal yeast).) 30 g 2  . pantoprazole (PROTONIX) 40 MG tablet Take 40 mg by mouth 2 (two) times daily.    . pentosan polysulfate (ELMIRON) 100 MG capsule Take 1 capsule (100 mg total) by mouth 3 (three) times daily. Reported on 01/05/2016 (Patient taking differently: Take 100 mg by mouth daily as needed (burning and pain). Reported on 01/05/2016) 90 capsule 11  . polyethylene glycol (MIRALAX) 17 g packet Take 17 g by mouth 2 (two) times daily. 14 each 0  . REPATHA SURECLICK 140 MG/ML SOAJ INJECT 140 MG INTO THE SKIN EVERY 14 DAYS 6 mL 0  . sucralfate (CARAFATE) 1 GM/10ML suspension Take 10 mLs (1 g total) by mouth 4 (four) times daily -  with meals and at bedtime. 420 mL 1  . traMADol (ULTRAM) 50 MG tablet Take 1 tablet by mouth 3 (three) times daily as needed.    . traZODone (DESYREL) 150 MG tablet Take 1 tablet (150 mg total) by mouth at bedtime. 30 tablet 3  .  Vitamin D, Ergocalciferol, (DRISDOL) 1.25 MG (50000 UNIT) CAPS capsule Take 1 capsule (50,000 Units total) by mouth every 7 (seven) days. 12x Weeks 5 capsule 3   No facility-administered medications prior to visit.    Allergies  Allergen Reactions  . Clindamycin Hcl Shortness Of Breath and Rash  . Penicillins Anaphylaxis  . Rosuvastatin Anaphylaxis  . Baclofen Rash  . Codeine Hives and Nausea Only  . Erythromycin Hives  . Latex Hives  .  Lincomycin Other (See Comments)  . Pentazocine Lactate Other (See Comments)    HALLUCINATION  . Pneumococcal Vaccine Polyvalent Hives, Swelling and Other (See Comments)    REACTION: swelling at injection site  . Prednisone Rash    Other reaction(s): Rash, Hives - can tolerate if she takes with benadryl   . Tamoxifen Nausea And Vomiting  . Haemophilus Influenzae Other (See Comments)    Local reaction at the site   . Haemophilus Influenzae Vaccines Other (See Comments)    Local reaction at site  . Levofloxacin     Other reaction(s): sick  . Other Other (See Comments)    Father had malignant hyperthermia  . Doxycycline Rash  . Fluzone [Influenza Virus Vaccine] Hives    Local reaction at the site    Review of Systems  Constitutional:  Positive for malaise/fatigue. Negative for fever.  HENT:  Negative for congestion.   Eyes:  Negative for blurred vision.  Respiratory:  Negative for sputum production and shortness of breath.   Cardiovascular:  Negative for chest pain, palpitations and leg swelling.  Gastrointestinal:  Negative for abdominal pain, blood in stool and nausea.  Genitourinary:  Negative for dysuria and frequency.  Musculoskeletal:  Positive for back pain, myalgias and neck pain. Negative for falls.  Skin:  Negative for rash.  Neurological:  Positive for dizziness and headaches. Negative for loss of consciousness.  Endo/Heme/Allergies:  Negative for environmental allergies.  Psychiatric/Behavioral:  Positive for depression. The  patient is nervous/anxious.        Objective:    Physical Exam Constitutional:      General: She is not in acute distress.    Appearance: Normal appearance. She is not ill-appearing or toxic-appearing.  HENT:     Head: Normocephalic and atraumatic.     Right Ear: External ear normal.     Left Ear: External ear normal.     Nose: Nose normal.  Eyes:     General:        Right eye: No discharge.        Left eye: No discharge.  Pulmonary:     Effort: Pulmonary effort is normal.  Skin:    Findings: No rash.  Neurological:     Mental Status: She is alert and oriented to person, place, and time.  Psychiatric:        Behavior: Behavior normal.        Thought Content: Thought content normal.     Comments: tearful   BP (!) 150/94 Comment: Pt just woke up  Pulse (!) 103 Comment: Pt just woke up  SpO2 97%  Wt Readings from Last 3 Encounters:  12/13/23 124 lb 6.4 oz (56.4 kg)  12/11/23 124 lb 6.4 oz (56.4 kg)  12/10/23 123 lb (55.8 kg)       Assessment & Plan:  Depression with anxiety Assessment & Plan: Is working with [facility] now.   Orders: -     Ambulatory referral to Psychiatry  Essential hypertension Assessment & Plan: Monitor and report, no changes to meds. Encouraged heart healthy diet such as the DASH diet and exercise as tolerated.     Hyperglycemia Assessment & Plan: hgba1c acceptable, minimize simple carbs. Increase exercise as tolerated.    Stage 3 chronic kidney disease, unspecified whether stage 3a or 3b CKD (HCC) Assessment & Plan: Hydrate and monitor    Statin intolerance Assessment & Plan: Does not tolerate statins   Arthralgia, unspecified joint Assessment & Plan: Continues to struggle with  back pain, awaiting MRI results from [facility] on back      Assessment and Plan    Chronic Pain Severe, widespread pain, particularly in the back and head. Pain is exacerbated by touch and movement. Patient is scheduled for an  MRI to further evaluate the back pain. -Continue current pain management regimen. -Consider dry needling as part of physical therapy.  Fatigue Severe, persistent fatigue limiting daily activities and social interactions. Possible side effect of Trazodone. -Recommend reducing Trazodone dose under the guidance of Dr. Ethelene Hal. -Encourage regular, light physical activity such as short walks and deep breathing exercises.  Headache Persistent despite Tylenol use. Possible side effect of Trazodone and/or Tramadol. -Recommend reducing Trazodone dose under the guidance of Dr. Ethelene Hal. -Consider alternative pain management strategies if headache persists.  Depression Patient reports lack of joy, energy, and appetite. Currently seeing a counselor and considering psychiatric consultation. -Schedule regular follow-up appointments every 4-6 weeks for the next six months to monitor progress and adjust treatment plan as necessary. referral to a psychiatrist for additional support and medication management.  General Health Maintenance -Encourage hydration, protein intake, and regular light physical activity. -Consider use of topical analgesics such as Biofreeze or menthol creams for pain relief.         I discussed the assessment and treatment plan with the patient. The patient was provided an opportunity to ask questions and all were answered. The patient agreed with the plan and demonstrated an understanding of the instructions.   The patient was advised to call back or seek an in-person evaluation if the symptoms worsen or if the condition fails to improve as anticipated.  Danise Edge, MD [facility] [phone] (phone) [phone] (fax)  [facility]

## 2023-12-25 ENCOUNTER — Encounter: Payer: Self-pay | Admitting: Medical Oncology

## 2023-12-25 DIAGNOSIS — G5603 Carpal tunnel syndrome, bilateral upper limbs: Secondary | ICD-10-CM | POA: Diagnosis not present

## 2023-12-28 ENCOUNTER — Ambulatory Visit: Payer: Self-pay | Admitting: Professional

## 2023-12-28 ENCOUNTER — Encounter: Payer: Self-pay | Admitting: Professional

## 2023-12-28 ENCOUNTER — Telehealth: Payer: Self-pay | Admitting: Emergency Medicine

## 2023-12-28 ENCOUNTER — Encounter: Payer: Self-pay | Admitting: Family

## 2023-12-28 DIAGNOSIS — F411 Generalized anxiety disorder: Secondary | ICD-10-CM

## 2023-12-28 DIAGNOSIS — F331 Major depressive disorder, recurrent, moderate: Secondary | ICD-10-CM

## 2023-12-28 NOTE — Progress Notes (Signed)
 [facility] Counselor/Therapist Progress Note  Patient ID: [name], MRN: [id number],    Date: 12/28/2023  Time Spent: 49 minutes 105-153pm   Treatment Type: Individual Therapy  Risk Assessment: Danger to Self:  No Self-injurious Behavior: No Danger to Others: No  Subjective: Patient arrived late for her in-person session due to having been sent to the wrong location.  Issues addressed: 1-physical -pt sees oncologist on Jan 13th -she is not as anxious and reports I'm just doing better -her spouse sees a difference with the pt since engaging therapy   -he said she is not as high strung -great grandchild was born without a rectum and she is getting continual treatment and surgeries   -pt has not met her yet due to how sick she is and she does a lot of praying   -she is vomiting post surgery and they are uncertain if it is a blockage 2-Christmas was sad -none of the children were home -church family took care of feeding the family -a neighbor couple and their daughter brought a pot of mexican chicken noodle soup   -they stayed for several hours and we had a wonderful time  Treatment Plan:   Goals Alleviate depressive symptoms Recognize, accept, and cope with depressive feelings Develop healthy thinking patterns Develop healthy interpersonal relationships (particularly younger daughter) Reduce overall frequency, intensity, and duration of anxiety Stabilize anxiety level while increasing ability to function Enhance ability to effectively cope with full variety of stressors Learn and implement coping skills that result in a reduction of anxiety    Objectives target date for all objectives is 09/26/2024 Verbalize an understanding of the cognitive, physiological, and behavioral components of anxiety       60 Learning and implement calming skills to reduce overall anxiety           60 Verbalize an understanding of the role that cognitive biases play in  excessive irrational worry and persistent anxiety symptoms   70 Identify, challenge, and replace biased fearful self-talk            60 Learn and implement problem solving strategies             70 Identify and engage in pleasant activities              80 Learning and implement personal and interpersonal skills to reduce anxiety and improve interpersonal relationships    60 Learn to accept limitations in life and commit to tolerating, rather than avoiding, unpleasant emotions while accomplishing meaningful goals 60 Identify major life conflicts from the past and present that form the basis for present anxiety       70 Maintain involvement in work, family, and social activities           70 Reestablish a consistent sleep-wake cycle             50 Verbalize an accurate understanding of depression            60 Identify and replace thoughts that support depression            50 Learn and implement behavioral strategies Verbalize an understanding and resolution of current interpersonal problems Learn and implement decision making skills Learn and implement conflict resolution skills to resolve interpersonal problems Verbalize an understanding of healthy and unhealthy emotions verbalize insight into how past relationships may be influence current experiences with depression Use mindfulness and acceptance strategies and increase value based behavior  Increase hopeful statements about the future.  Interventions Engage the patient in behavioral activation Use instruction, modeling, and role-playing to build the patient 's general social, communication, and/or conflict resolution skills Use Acceptance and Commitment Therapy to help patient  accept uncomfortable realities in order to accomplish value-consistent goals Reinforce the patient 's insight into the role of her past emotional pain and present anxiety  Support the patient in following through with work, family, and social activities Teach and  implement sleep hygiene practices  Teach the patient relaxation skills Assign the patient homework Discuss examples demonstrating that unrealistic worry overestimates the probability of threats and underestimates patient's ability  Assist the patient in analyzing his or her worries Help patient understand that avoidance is reinforcing  Consistent with treatment model, discuss how change in cognitive, behavioral, and interpersonal can help patient alleviate depression CBT Behavioral activation to help the patient explore the relationship, nature of the dispute Help the patient develop new interpersonal skills and relationships Conduct Problem solving therapy Teach conflict resolution skills Use a process-experiential approach Conduct ACT  Diagnosis:Major depressive disorder, recurrent episode, moderate (HCC)  Generalized anxiety disorder  Plan:  -meet on Wednesday, January 02, 2024 at 11am in person.

## 2023-12-28 NOTE — Telephone Encounter (Signed)
 Spoke with [facility] - they just needed updated [facility] ID number for 2025. Provided to pharmacy. They run thru and cost to patient will be $0.  Mrs. Glendening notified.

## 2023-12-28 NOTE — Telephone Encounter (Signed)
 Copied from CRM [id number]. Topic: General - Call Back - No Documentation >> Dec 28, 2023 10:24 AM Curlee DEL wrote: Reason for CRM: Patient returning Tammy Eckard's phone call - patient is experiencing an issue with coverage on her REPATHA  Medication. Please call patient when you have a moment.

## 2023-12-31 NOTE — Progress Notes (Signed)
 HPI: FU CAD. Cardiac catheterization in August 2011 showed a 40% proximal LAD, 30% mid LAD, 30% RCA and her ejection fraction was 60%. Nuclear study in June 2013 showed no ischemia with an ejection fraction of 68%. Nuclear study March 2016 showed ejection fraction 67%. Breast attenuation but no ischemia. MRA of the head October 2018 normal. Renal dopplers 1/19 showed no RAS.  Echocardiogram repeated November 2022 and showed normal LV function, grade 1 diastolic dysfunction, mild mitral regurgitation.  Patient seen in the emergency room November 2024 with chest pain.  Troponin was normal.  It was felt this was likely from fibromyalgia.  Since last seen she has continuous pain in the left chest area unchanged.  No exertional symptoms.  She does have some dyspnea on exertion but no orthopnea or PND.  No syncope.  Current Outpatient Medications  Medication Sig Dispense Refill   acetaminophen  (TYLENOL ) 500 MG tablet Take 1,000 mg by mouth every 6 (six) hours as needed for mild pain or headache.      albuterol  (VENTOLIN  HFA) 108 (90 Base) MCG/ACT inhaler Inhale 2 puffs into the lungs every 4 (four) hours as needed for wheezing or shortness of breath (coughing fits). 18 g 1   amLODipine  (NORVASC ) 5 MG tablet Take 5 mg by mouth daily.     aspirin  EC 81 MG tablet Take 1 tablet (81 mg total) by mouth daily. 90 tablet 3   atenolol  (TENORMIN ) 25 MG tablet Take 1 tablet (25 mg total) by mouth 2 (two) times daily. Take 1 tablet by mouth 2 time a day 180 tablet 3   carboxymethylcellul-glycerin (OPTIVE) 0.5-0.9 % ophthalmic solution Place 1 drop into both eyes daily as needed for dry eyes.     Cholecalciferol  (VITAMIN D3) 50 MCG (2000 UT) CAPS Take 2,000 Units by mouth daily.     cyanocobalamin  (VITAMIN B12) 1000 MCG tablet Take 1,000 mg by mouth daily with breakfast.     diclofenac Sodium (VOLTAREN) 1 % GEL Apply 2 g topically daily as needed (Back pain).     DULoxetine  (CYMBALTA ) 30 MG capsule Take 1 capsule  (30 mg total) by mouth daily at 12 noon. 30 capsule 3   DULoxetine  (CYMBALTA ) 60 MG capsule Take 1 capsule (60 mg total) by mouth every morning. 90 capsule 1   EPINEPHrine  0.3 mg/0.3 mL IJ SOAJ injection Inject 0.3 mg into the muscle as needed for anaphylaxis. 2 each 1   famotidine  (PEPCID ) 20 MG tablet Take 1 tablet (20 mg total) by mouth 2 (two) times daily as needed for heartburn or indigestion. 60 tablet 1   fluticasone  (FLONASE ) 50 MCG/ACT nasal spray Use 2 spray(s) in each nostril once daily (Patient taking differently: Place 2 sprays into both nostrils daily as needed for allergies or rhinitis.) 16 g 5   fluticasone  (FLOVENT  HFA) 110 MCG/ACT inhaler Inhale 2 puffs into the lungs 2 (two) times daily. 1 each 5   folic acid  (FOLVITE ) 1 MG tablet Take 1 tablet (1 mg total) by mouth daily.     furosemide  (LASIX ) 20 MG tablet TAKE 1 TABLET BY MOUTH AS NEEDED FOR EDEMA 90 tablet 3   gabapentin  (NEURONTIN ) 300 MG capsule Take 300-900 mg by mouth See admin instructions. Take 600 mg in the morning 300 mg at lunch time and 900 mg at bedtime     hydrOXYzine  (ATARAX ) 10 MG tablet Take 0.5-2 tablets (5-20 mg total) by mouth 3 (three) times daily as needed for anxiety (not with Loratadine ). 90  tablet 2   loratadine  (CLARITIN ) 10 MG tablet Take 10 mg by mouth daily as needed for rhinitis.     nitroGLYCERIN  (NITROSTAT ) 0.4 MG SL tablet Place 1 tablet (0.4 mg total) under the tongue every 5 (five) minutes x 3 doses as needed for chest pain. 25 tablet 2   nystatin  cream (MYCOSTATIN ) Apply 1 application topically 2 (two) times daily. (Patient taking differently: Apply 1 application  topically daily as needed (vaginal yeast).) 30 g 2   pantoprazole  (PROTONIX ) 40 MG tablet Take 40 mg by mouth 2 (two) times daily.     pentosan polysulfate (ELMIRON ) 100 MG capsule Take 1 capsule (100 mg total) by mouth 3 (three) times daily. Reported on 01/05/2016 (Patient taking differently: Take 100 mg by mouth daily as needed  (burning and pain). Reported on 01/05/2016) 90 capsule 11   polyethylene glycol (MIRALAX ) 17 g packet Take 17 g by mouth 2 (two) times daily. 14 each 0   REPATHA  SURECLICK 140 MG/ML SOAJ INJECT 140 MG INTO THE SKIN EVERY 14 DAYS 6 mL 0   sucralfate  (CARAFATE ) 1 GM/10ML suspension Take 10 mLs (1 g total) by mouth 4 (four) times daily -  with meals and at bedtime. 420 mL 1   traMADol  (ULTRAM ) 50 MG tablet Take 1 tablet by mouth 3 (three) times daily as needed.     traZODone  (DESYREL ) 150 MG tablet Take 1 tablet (150 mg total) by mouth at bedtime. 30 tablet 3   Vitamin D , Ergocalciferol , (DRISDOL ) 1.25 MG (50000 UNIT) CAPS capsule Take 1 capsule (50,000 Units total) by mouth every 7 (seven) days. 12x Weeks 5 capsule 3   No current facility-administered medications for this visit.     Past Medical History:  Diagnosis Date   Abdominal pain 10/26/2013   Allergic rhinitis 01/25/2016   ANA positive 07/29/2013   Anemia    Arthralgia 08/18/2013   Arthritis    Asthma, allergic 08/05/2012   Atrial flutter    past history- not current   Atypical chest pain 01/01/2015   Bilateral carpal tunnel syndrome 03/04/2015   CAD in native artery 08/25/2010   Minor CAD 2009, 2011, low risk Myoview  2013 and 2016        Cataract    bilaterally removed    Cellulitis of breast 02/05/2023   Chronic constipation    Chronic cough 01/09/2017   Chronic interstitial cystitis 02/01/2017   Chronic night sweats 01/08/2015   Common bile duct dilatation 02/13/2023   Concussion    x 3   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   DDD (degenerative disc disease), cervical 09/17/2018   Degenerative scoliosis 04/22/2018   Deviated septum 05/12/2016   Dysphagia    Dyspnea    Dysuria 03/09/2021   Elevated alkaline phosphatase level 01/25/2023   Essential hypertension 08/25/2010   External hemorrhoids 02/05/2015   Facial trauma 01/22/2023   Family history of adverse reaction to anesthesia    Family history of malignant  hyperthermia    father had this   Fibrocystic breast disease 10/18/2010   Fibromyalgia    Frequency of urination    Generalized anxiety disorder 08/03/2010   GERD (gastroesophageal reflux disease)    Headache 10/13/2013   High serum vitamin D  05/18/2021   History of chronic bronchitis    History of colonic polyp 02/25/2019   History of hiatal hernia    History of tachycardia    CONTROLLED  WITH ATENOLOL    Hyperglycemia 03/08/2021   Hyperkalemia 09/28/2022   Hyperlipidemia  Intertrigo 12/13/2022   Kidney stone 02/26/2019   Knee pain, right 04/30/2015   Left hip pain 04/30/2015   Leg pain 02/24/2011   Qualifier: Diagnosis of   By: Antonio DOJamee         Leukocytosis 07/05/2022   Local reaction to hymenoptera sting 05/09/2023   Low back pain 09/08/2020   Major depressive disorder 02/05/2013   Malignant neoplasm of lower-outer quadrant of right breast of female, estrogen receptor positive 01/03/2013   Nasal turbinate hypertrophy 05/12/2016   Oral herpes 02/05/2023   OSA and COPD overlap syndrome 06/10/2020   Osteoporosis 01/2019   T score -2.2 stable/improved from prior study   Pelvic pain    Peripheral neuropathy 05/22/2014   Personal history of radiation therapy    Pneumonia    Primary insomnia 06/25/2017   Pulmonary nodules 02/12/2015   Rash and other nonspecific skin eruption 05/09/2023   Recurrent falls 05/02/2020   Recurrent sinusitis 02/25/2019   Right arm pain 07/20/2016   RLS (restless legs syndrome) 11/09/2019   RUQ pain 07/05/2022   S/P radiation therapy 11/12/12 - 12/05/12   right Breast   Sciatica 04/30/2015   Sepsis 2014   from UTI    Sjogren's disease 12/12/2021   Splenic lesion 09/02/2012   Sprain of lateral ligament of ankle joint 07/31/2023   Stage 3 chronic kidney disease    Statin intolerance 02/29/2020   Stricture and stenosis of esophagus 10/19/2011   Tongue lesion 02/05/2023   Tremor 07/05/2022   Urgency of urination    Vertigo  01/22/2023   Vitamin B12 deficiency 03/08/2021   Vocal cord cyst 05/18/2021    Past Surgical History:  Procedure Laterality Date   24 HOUR PH STUDY N/A 04/03/2016   Procedure: 24 HOUR PH STUDY;  Surgeon: Elspeth Deward Naval, MD;  Location: [facility];  Service: Gastroenterology;  Laterality: N/A;   BREAST BIOPSY Right 08/23/2012   ADH   BREAST BIOPSY Right 10/11/2012   Ductal Carcinoma   BREAST EXCISIONAL BIOPSY Right 09/04/2013   benign   BREAST LUMPECTOMY Right 10/11/2012   W/ SLN BX   CARDIAC CATHETERIZATION  09-13-2007  DR MORRIS   WELL-PRESERVED LVF/  DIFFUSE SCATTERED CORONARY CALCIFACATION AND ATHEROSCLEROSIS WITHOUT OBSTRUCTION   CARDIAC CATHETERIZATION  08-04-2010  DR MCALHANY   NON-OBSTRUCTIVE CAD/  pLAD 40%/  oLAD 30%/  mLAD 30%/  pRCA 30%/  EF 60%   CARDIOVASCULAR STRESS TEST  06-18-2012  DR McALHANY   LOW RISK NUCLEAR STUDY/  SMALL FIXED AREA OF MODERATELY DECREASED UPTAKE IN ANTEROSEPTAL WALL WHICH MAY BE ARTIFACTUAL/  NO ISCHEMIA/  EF 68%   COLONOSCOPY  09/29/2010   CYSTOSCOPY     CYSTOSCOPY WITH HYDRODISTENSION AND BIOPSY N/A 03/06/2014   Procedure: CYSTOSCOPY/HYDRODISTENSION/ INSTILATION OF MARCAINE  AND PYRIDIUM ;  Surgeon: Arlena LILLETTE Gal, MD;  Location: [facility];  Service: Urology;  Laterality: N/A;   CYSTOSCOPY/URETEROSCOPY/HOLMIUM LASER/STENT PLACEMENT Left 03/21/2023   Procedure: CYSTOSCOPY LEFT RETROGRADE PYELOGRAM, LEFT URETEROSCOPY, HOLMIUM LASER LITHOTRIPSYM, AND LEFT URETERAL STENT PLACEMENT;  Surgeon: Devere Lonni Righter, MD;  Location: [facility];  Service: Urology;  Laterality: Left;  60 MINUTES   ESOPHAGEAL MANOMETRY N/A 04/03/2016   Procedure: ESOPHAGEAL MANOMETRY (EM);  Surgeon: Elspeth Deward Naval, MD;  Location: [facility];  Service: Gastroenterology;  Laterality: N/A;   EXTRACORPOREAL SHOCK WAVE LITHOTRIPSY Left 02/06/2019   Procedure: EXTRACORPOREAL SHOCK WAVE LITHOTRIPSY (ESWL);  Surgeon: Ottelin, Mark, MD;   Location: [facility];  Service: Urology;  Laterality: Left;   NASAL  SINUS SURGERY  1985   ORIF RIGHT ANKLE  FX  2006   POLYPECTOMY     REMOVAL VOCAL CORD CYST  01/2013   RIGHT BREAST BX  08/23/2012   RIGHT HAND SURGERY  X3  LAST ONE 2009   INCLUDES  ORIF RIGHT 5TH FINGER AND REVISION TWICE   SKIN CANCER EXCISION     face,arms   TONSILLECTOMY AND ADENOIDECTOMY  AGE 64   TOTAL ABDOMINAL HYSTERECTOMY W/ BILATERAL SALPINGOOPHORECTOMY  1982   W/  APPENDECTOMY   TRANSTHORACIC ECHOCARDIOGRAM  06/24/2012   GRADE I DIASTOLIC DYSFUNCTION/  EF 55-60%/  MILD MR   UPPER GASTROINTESTINAL ENDOSCOPY      Social History   Socioeconomic History   Marital status: Married    Spouse name: Tanda    Number of children: 3   Years of education: 16   Highest education level: Bachelor's degree (e.g., BA, AB, BS)  Occupational History   Occupation: Retired    Comment: [employer]  Tobacco Use   Smoking status: Former    Current packs/day: 0.00    Average packs/day: 1 pack/day for 15.0 years (15.0 ttl pk-yrs)    Types: Cigarettes    Start date: 05/27/1990    Quit date: 05/27/2005    Years since quitting: 18.6    Passive exposure: Never   Smokeless tobacco: Never  Vaping Use   Vaping status: Never Used  Substance and Sexual Activity   Alcohol use: No    Alcohol/week: 0.0 standard drinks of alcohol   Drug use: No   Sexual activity: Not Currently    Partners: Male    Birth control/protection: Surgical    Comment: 1st intercourse 78 yo-Fewer than 5 partners, hysterectomy  Other Topics Concern   Not on file  Social History Narrative   Lives with husband.   Caffeine use: 1/2 cup per day   Exercise-- 2days a week [employer],  Water  aerobics, walking       Retired engineer, civil (consulting)   No dietary restrictions, tries to maintain a heart healthy diet   Very pleasant lady   Social Drivers of Corporate Investment Banker Strain: Low Risk  (12/07/2023)   Overall Financial Resource Strain (CARDIA)    Difficulty of Paying Living  Expenses: Not hard at all  Food Insecurity: No Food Insecurity (12/07/2023)   Hunger Vital Sign    Worried About Running Out of Food in the Last Year: Never true    Ran Out of Food in the Last Year: Never true  Transportation Needs: No Transportation Needs (12/07/2023)   PRAPARE - Administrator, Civil Service (Medical): No    Lack of Transportation (Non-Medical): No  Physical Activity: Inactive (12/07/2023)   Exercise Vital Sign    Days of Exercise per Week: 0 days    Minutes of Exercise per Session: 20 min  Stress: No Stress Concern Present (12/07/2023)   [employer] of Occupational Health - Occupational Stress Questionnaire    Feeling of Stress : Only a little  Recent Concern: Stress - Stress Concern Present (10/20/2023)   [employer] of Occupational Health - Occupational Stress Questionnaire    Feeling of Stress : To some extent  Social Connections: Socially Integrated (12/07/2023)   Social Connection and Isolation Panel [NHANES]    Frequency of Communication with Friends and Family: More than three times a week    Frequency of Social Gatherings with Friends and Family: Twice a week    Attends Religious Services: More than 4 times  per year    Active Member of Clubs or Organizations: Yes    Attends Banker Meetings: More than 4 times per year    Marital Status: Married  Catering Manager Violence: Not At Risk (09/12/2023)   Humiliation, Afraid, Rape, and Kick questionnaire    Fear of Current or Ex-Partner: No    Emotionally Abused: No    Physically Abused: No    Sexually Abused: No    Family History  Problem Relation Age of Onset   Rectal cancer Mother    Colon cancer Mother    Pancreatic cancer Mother    Diabetes Mother    Breast cancer Mother 16   Breast cancer Maternal Aunt        breast   Birth defects Maternal Aunt    Irritable bowel syndrome Son    Heart disease Son        CAD, MV replacement   Allergic Disorder Daughter     Diabetes Daughter    Colon cancer Father    Colon polyps Father    Diabetes Father    Stroke Father    Heart disease Father        CHF   Hyperlipidemia Father    Hypertension Father    Arthritis Father    Breast cancer Paternal Aunt    Breast cancer Paternal Aunt    Arthritis Paternal Uncle    Allergic Disorder Daughter    Heart disease Cousin        CAD, had a blood clot after stenting   Esophageal cancer Neg Hx    Stomach cancer Neg Hx     ROS: no fevers or chills, productive cough, hemoptysis, dysphasia, odynophagia, melena, hematochezia, dysuria, hematuria, rash, seizure activity, orthopnea, PND, pedal edema, claudication. Remaining systems are negative.  Physical Exam: Well-developed well-nourished in no acute distress.  Skin is warm and dry.  HEENT is normal.  Neck is supple.  Chest is clear to auscultation with normal expansion.  Cardiovascular exam is regular rate and rhythm.  Abdominal exam nontender or distended. No masses palpated. Extremities show no edema. neuro grossly intact  ECG-November 05, 2023-normal sinus rhythm with nonspecific ST changes.  Personally reviewed  A/P  1 recent episode of chest pain-troponin was normal.  She does not have exertional chest pain.  No plans for further ischemia evaluation at this point.  2 coronary artery disease-continue aspirin .  She is intolerant to statins.  3 hyperlipidemia-intolerant to statins.  Continue Repatha .  4 hypertension-blood pressure mildly elevated.  However controlled at home.  Continue present medications and follow.  Redell Shallow, MD

## 2024-01-01 DIAGNOSIS — G5603 Carpal tunnel syndrome, bilateral upper limbs: Secondary | ICD-10-CM | POA: Diagnosis not present

## 2024-01-02 ENCOUNTER — Telehealth: Payer: Medicare Other

## 2024-01-02 ENCOUNTER — Ambulatory Visit: Payer: Medicare Other | Admitting: Professional

## 2024-01-03 ENCOUNTER — Encounter: Payer: Self-pay | Admitting: Cardiology

## 2024-01-03 ENCOUNTER — Ambulatory Visit: Payer: Medicare Other | Admitting: Professional

## 2024-01-03 ENCOUNTER — Encounter: Payer: Self-pay | Admitting: Family

## 2024-01-03 ENCOUNTER — Ambulatory Visit: Payer: HMO | Attending: Cardiology | Admitting: Cardiology

## 2024-01-03 VITALS — BP 140/70 | HR 60 | Ht 65.0 in | Wt 127.6 lb

## 2024-01-03 DIAGNOSIS — R072 Precordial pain: Secondary | ICD-10-CM

## 2024-01-03 DIAGNOSIS — E785 Hyperlipidemia, unspecified: Secondary | ICD-10-CM

## 2024-01-03 DIAGNOSIS — I10 Essential (primary) hypertension: Secondary | ICD-10-CM

## 2024-01-03 DIAGNOSIS — I2510 Atherosclerotic heart disease of native coronary artery without angina pectoris: Secondary | ICD-10-CM

## 2024-01-03 NOTE — Patient Instructions (Signed)
    Follow-Up: At [facility], you and your health needs are our priority.  As part of our continuing mission to provide you with exceptional heart care, we have created designated Provider Care Teams.  These Care Teams include your primary Cardiologist (physician) and Advanced Practice Providers (APPs -  Physician Assistants and Nurse Practitioners) who all work together to provide you with the care you need, when you need it.    Your next appointment:   12 month(s)  Provider:   Olga Millers, MD

## 2024-01-07 ENCOUNTER — Inpatient Hospital Stay (HOSPITAL_BASED_OUTPATIENT_CLINIC_OR_DEPARTMENT_OTHER): Payer: HMO | Admitting: Hematology & Oncology

## 2024-01-07 ENCOUNTER — Inpatient Hospital Stay: Payer: HMO | Attending: Medical Oncology

## 2024-01-07 ENCOUNTER — Ambulatory Visit: Payer: Medicare Other | Admitting: Medical Oncology

## 2024-01-07 ENCOUNTER — Other Ambulatory Visit: Payer: Self-pay

## 2024-01-07 ENCOUNTER — Encounter: Payer: Self-pay | Admitting: Hematology & Oncology

## 2024-01-07 ENCOUNTER — Inpatient Hospital Stay: Payer: Medicare Other

## 2024-01-07 ENCOUNTER — Encounter: Payer: Self-pay | Admitting: *Deleted

## 2024-01-07 VITALS — BP 113/40 | HR 73 | Temp 97.8°F | Resp 18 | Ht 65.0 in | Wt 125.0 lb

## 2024-01-07 DIAGNOSIS — D72823 Leukemoid reaction: Secondary | ICD-10-CM | POA: Diagnosis not present

## 2024-01-07 DIAGNOSIS — D500 Iron deficiency anemia secondary to blood loss (chronic): Secondary | ICD-10-CM

## 2024-01-07 DIAGNOSIS — Z853 Personal history of malignant neoplasm of breast: Secondary | ICD-10-CM | POA: Insufficient documentation

## 2024-01-07 DIAGNOSIS — R7989 Other specified abnormal findings of blood chemistry: Secondary | ICD-10-CM

## 2024-01-07 DIAGNOSIS — C9000 Multiple myeloma not having achieved remission: Secondary | ICD-10-CM | POA: Diagnosis not present

## 2024-01-07 DIAGNOSIS — D509 Iron deficiency anemia, unspecified: Secondary | ICD-10-CM | POA: Insufficient documentation

## 2024-01-07 DIAGNOSIS — K2100 Gastro-esophageal reflux disease with esophagitis, without bleeding: Secondary | ICD-10-CM

## 2024-01-07 DIAGNOSIS — M797 Fibromyalgia: Secondary | ICD-10-CM | POA: Diagnosis not present

## 2024-01-07 DIAGNOSIS — D508 Other iron deficiency anemias: Secondary | ICD-10-CM

## 2024-01-07 LAB — RETIC PANEL
Immature Retic Fract: 17.6 % — ABNORMAL HIGH (ref 2.3–15.9)
RBC.: 4.63 MIL/uL (ref 3.87–5.11)
Retic Count, Absolute: 71.8 10*3/uL (ref 19.0–186.0)
Retic Ct Pct: 1.6 % (ref 0.4–3.1)
Reticulocyte Hemoglobin: 33 pg (ref >27.9)

## 2024-01-07 LAB — CMP (CANCER CENTER ONLY)
ALT: 6 U/L (ref 0–44)
AST: 15 U/L (ref 15–41)
Albumin: 4.1 g/dL (ref 3.5–5.0)
Alkaline Phosphatase: 105 U/L (ref 38–126)
Anion gap: 7 (ref 5–15)
BUN: 16 mg/dL (ref 8–23)
CO2: 33 mmol/L — ABNORMAL HIGH (ref 22–32)
Calcium: 9.7 mg/dL (ref 8.9–10.3)
Chloride: 102 mmol/L (ref 98–111)
Creatinine: 1.25 mg/dL — ABNORMAL HIGH (ref 0.44–1.00)
GFR, Estimated: 44 mL/min — ABNORMAL LOW (ref >60)
Glucose, Bld: 103 mg/dL — ABNORMAL HIGH (ref 70–99)
Potassium: 5.1 mmol/L (ref 3.5–5.1)
Sodium: 142 mmol/L (ref 135–145)
Total Bilirubin: 0.5 mg/dL (ref 0.0–1.2)
Total Protein: 6.7 g/dL (ref 6.5–8.1)

## 2024-01-07 LAB — CBC
HCT: 43.5 % (ref 36.0–46.0)
Hemoglobin: 13.8 g/dL (ref 12.0–15.0)
MCH: 29.9 pg (ref 26.0–34.0)
MCHC: 31.7 g/dL (ref 30.0–36.0)
MCV: 94.4 fL (ref 80.0–100.0)
Platelets: 275 10*3/uL (ref 150–400)
RBC: 4.61 MIL/uL (ref 3.87–5.11)
RDW: 14.8 % (ref 11.5–15.5)
WBC: 9.2 10*3/uL (ref 4.0–10.5)
nRBC: 0 % (ref 0.0–0.2)

## 2024-01-07 LAB — IRON AND IRON BINDING CAPACITY (CC-WL,HP ONLY)
Iron: 81 ug/dL (ref 28–170)
Saturation Ratios: 38 % — ABNORMAL HIGH (ref 10.4–31.8)
TIBC: 213 ug/dL — ABNORMAL LOW (ref 250–450)
UIBC: 132 ug/dL — ABNORMAL LOW (ref 148–442)

## 2024-01-07 LAB — FERRITIN: Ferritin: 184 ng/mL (ref 11–307)

## 2024-01-07 NOTE — Progress Notes (Signed)
 Hematology and Oncology Follow Up Visit  [name] [id number] Jul 07, 1946 78 y.o. 01/07/2024   Principle Diagnosis:  Abnormal MRI Iron deficiency anemia  Current Therapy:   IV iron-Feraheme given on 10/04/2023     Interim History:  [name] is back for follow-up.  Unfortunately, she does not feel well.  We still have not figured out what is wrong with her.  She was sent here because of a area of abnormality on her MRI.  We subsequently did a bone marrow biopsy on her.  This was done on 12/13/2023.  The pathology report ([id number]) was relatively unremarkable.  Of course, the specimen may not been as sufficient as we would like.  Apparently, there is a lot of fragmented pieces of core and trabecular bone with a limited amount of pleuritic elements.  However, nothing was obviously noted.  She had 24-hour urine that was done which was unremarkable although there was an elevated kappa light chain excretion but there is no monoclonal kappa light chain in the urine.  She had a SPEP that was done which was negative for any measurable monoclonal spike.  Of course, the comment was that there was a possible monoclonal protein but below the level of detection.  Her serum kappa light chain and lambda light chains were both somewhat elevated.  She had normal immunoglobulin levels.  She does have autoimmune issues.  She did have a positive ANA.  Again, I am not sure where we will find a hematologic problem.  However, she was sent to us  and I am sure that we are the doctors that are supposed to help her out.  We will do a PET scan on her.  We will see if this area on the MRI and her spine is active.  She does have a remote history of breast cancer.  I think this was 11 years ago.  It sounds like she had early stage disease.  This appeared to be DCIS.  She had a lumpectomy and radiotherapy.  I think this was in the right breast.  I think this was diagnosed back in 2013.  She completed  radiotherapy in December 2013.  She was then placed on tamoxifen .  She completed this in 2014 and she tolerated this very poorly.  She has had no rashes.  She does have fibromyalgia.  I suspect that she probably has a significant autoimmune problem.  There is been no change in bowel or bladder habits.  Her iron studies look okay.  Her iron saturation was 38%.  She has had no swollen lymph nodes.  Her appetite has been somewhat decreased.  Currently, I would say that her performance status is probably ECOG 2.   Medications:  Current Outpatient Medications:    acetaminophen  (TYLENOL ) 500 MG tablet, Take 1,000 mg by mouth every 6 (six) hours as needed for mild pain or headache. , Disp: , Rfl:    albuterol  (VENTOLIN  HFA) 108 (90 Base) MCG/ACT inhaler, Inhale 2 puffs into the lungs every 4 (four) hours as needed for wheezing or shortness of breath (coughing fits)., Disp: 18 g, Rfl: 1   amLODipine  (NORVASC ) 5 MG tablet, Take 5 mg by mouth daily., Disp: , Rfl:    aspirin  EC 81 MG tablet, Take 1 tablet (81 mg total) by mouth daily., Disp: 90 tablet, Rfl: 3   atenolol  (TENORMIN ) 25 MG tablet, Take 1 tablet (25 mg total) by mouth 2 (two) times daily. Take 1 tablet by mouth 2 time a day,  Disp: 180 tablet, Rfl: 3   carboxymethylcellul-glycerin (OPTIVE) 0.5-0.9 % ophthalmic solution, Place 1 drop into both eyes daily as needed for dry eyes., Disp: , Rfl:    Cholecalciferol  (VITAMIN D3) 50 MCG (2000 UT) CAPS, Take 2,000 Units by mouth daily., Disp: , Rfl:    cyanocobalamin  (VITAMIN B12) 1000 MCG tablet, Take 1,000 mg by mouth daily with breakfast., Disp: , Rfl:    diclofenac Sodium (VOLTAREN) 1 % GEL, Apply 2 g topically daily as needed (Back pain)., Disp: , Rfl:    DULoxetine  (CYMBALTA ) 30 MG capsule, Take 1 capsule (30 mg total) by mouth daily at 12 noon., Disp: 30 capsule, Rfl: 3   DULoxetine  (CYMBALTA ) 60 MG capsule, Take 1 capsule (60 mg total) by mouth every morning., Disp: 90 capsule, Rfl: 1    EPINEPHrine  0.3 mg/0.3 mL IJ SOAJ injection, Inject 0.3 mg into the muscle as needed for anaphylaxis., Disp: 2 each, Rfl: 1   famotidine  (PEPCID ) 20 MG tablet, Take 1 tablet (20 mg total) by mouth 2 (two) times daily as needed for heartburn or indigestion., Disp: 60 tablet, Rfl: 1   fluticasone  (FLONASE ) 50 MCG/ACT nasal spray, Use 2 spray(s) in each nostril once daily (Patient taking differently: Place 2 sprays into both nostrils daily as needed for allergies or rhinitis.), Disp: 16 g, Rfl: 5   fluticasone  (FLOVENT  HFA) 110 MCG/ACT inhaler, Inhale 2 puffs into the lungs 2 (two) times daily., Disp: 1 each, Rfl: 5   folic acid  (FOLVITE ) 1 MG tablet, Take 1 tablet (1 mg total) by mouth daily., Disp: , Rfl:    furosemide  (LASIX ) 20 MG tablet, TAKE 1 TABLET BY MOUTH AS NEEDED FOR EDEMA, Disp: 90 tablet, Rfl: 3   gabapentin  (NEURONTIN ) 300 MG capsule, Take 300-900 mg by mouth See admin instructions. Take 600 mg in the morning 300 mg at lunch time and 900 mg at bedtime, Disp: , Rfl:    hydrOXYzine  (ATARAX ) 10 MG tablet, Take 0.5-2 tablets (5-20 mg total) by mouth 3 (three) times daily as needed for anxiety (not with Loratadine )., Disp: 90 tablet, Rfl: 2   loratadine  (CLARITIN ) 10 MG tablet, Take 10 mg by mouth daily as needed for rhinitis., Disp: , Rfl:    nitroGLYCERIN  (NITROSTAT ) 0.4 MG SL tablet, Place 1 tablet (0.4 mg total) under the tongue every 5 (five) minutes x 3 doses as needed for chest pain., Disp: 25 tablet, Rfl: 2   nystatin  cream (MYCOSTATIN ), Apply 1 application topically 2 (two) times daily. (Patient taking differently: Apply 1 application  topically daily as needed (vaginal yeast).), Disp: 30 g, Rfl: 2   pantoprazole  (PROTONIX ) 40 MG tablet, Take 40 mg by mouth 2 (two) times daily., Disp: , Rfl:    pentosan polysulfate (ELMIRON ) 100 MG capsule, Take 1 capsule (100 mg total) by mouth 3 (three) times daily. Reported on 01/05/2016 (Patient taking differently: Take 100 mg by mouth daily as needed  (burning and pain). Reported on 01/05/2016), Disp: 90 capsule, Rfl: 11   polyethylene glycol (MIRALAX ) 17 g packet, Take 17 g by mouth 2 (two) times daily., Disp: 14 each, Rfl: 0   REPATHA  SURECLICK 140 MG/ML SOAJ, INJECT 140 MG INTO THE SKIN EVERY 14 DAYS, Disp: 6 mL, Rfl: 0   sucralfate  (CARAFATE ) 1 GM/10ML suspension, Take 10 mLs (1 g total) by mouth 4 (four) times daily -  with meals and at bedtime., Disp: 420 mL, Rfl: 1   traMADol  (ULTRAM ) 50 MG tablet, Take 1 tablet by mouth 3 (three) times  daily as needed., Disp: , Rfl:    traZODone  (DESYREL ) 150 MG tablet, Take 1 tablet (150 mg total) by mouth at bedtime., Disp: 30 tablet, Rfl: 3   Vitamin D , Ergocalciferol , (DRISDOL ) 1.25 MG (50000 UNIT) CAPS capsule, Take 1 capsule (50,000 Units total) by mouth every 7 (seven) days. 12x Weeks, Disp: 5 capsule, Rfl: 3  Allergies:  Allergies  Allergen Reactions   Clindamycin Hcl Shortness Of Breath and Rash   Penicillins Anaphylaxis   Rosuvastatin  Anaphylaxis   Baclofen  Rash   Codeine Hives and Nausea Only   Erythromycin Hives   Latex Hives   Lincomycin Other (See Comments)   Pentazocine Lactate Other (See Comments)    HALLUCINATION   Pneumococcal Vaccine Polyvalent Hives, Swelling and Other (See Comments)    REACTION: swelling at injection site   Prednisone  Rash    Other reaction(s): Rash, Hives - can tolerate if she takes with benadryl     Tamoxifen  Nausea And Vomiting   Haemophilus Influenzae Other (See Comments)    Local reaction at the site    Haemophilus Influenzae Vaccines Other (See Comments)    Local reaction at site   Levofloxacin      Other reaction(s): sick   Other Other (See Comments)    Father had malignant hyperthermia   Doxycycline  Rash   Fluzone [Influenza Virus Vaccine] Hives    Local reaction at the site    Past Medical History, Surgical history, Social history, and Family History were reviewed and updated.  Review of Systems: Review of Systems  Constitutional:   Positive for fatigue.  HENT:  Negative.    Eyes: Negative.   Respiratory: Negative.    Cardiovascular: Negative.   Gastrointestinal:  Positive for abdominal pain.  Endocrine: Negative.   Genitourinary: Negative.    Musculoskeletal:  Positive for myalgias.  Neurological: Negative.   Hematological: Negative.   Psychiatric/Behavioral: Negative.      Physical Exam:  height is 5' 5 (1.651 m) and weight is 125 lb (56.7 kg). Her oral temperature is 97.8 F (36.6 C). Her blood pressure is 113/40 (abnormal) and her pulse is 73. Her respiration is 18 and oxygen saturation is 99%.   Wt Readings from Last 3 Encounters:  01/07/24 125 lb (56.7 kg)  01/03/24 127 lb 9.6 oz (57.9 kg)  12/13/23 124 lb 6.4 oz (56.4 kg)    Physical Exam Vitals reviewed.  HENT:     Head: Normocephalic and atraumatic.  Eyes:     Pupils: Pupils are equal, round, and reactive to light.  Cardiovascular:     Rate and Rhythm: Normal rate and regular rhythm.     Heart sounds: Normal heart sounds.  Pulmonary:     Effort: Pulmonary effort is normal.     Breath sounds: Normal breath sounds.  Abdominal:     General: Bowel sounds are normal.     Palpations: Abdomen is soft.  Musculoskeletal:        General: No tenderness or deformity. Normal range of motion.     Cervical back: Normal range of motion.  Lymphadenopathy:     Cervical: No cervical adenopathy.  Skin:    General: Skin is warm and dry.     Findings: No erythema or rash.  Neurological:     Mental Status: She is alert and oriented to person, place, and time.  Psychiatric:        Behavior: Behavior normal.        Thought Content: Thought content normal.  Judgment: Judgment normal.      Lab Results  Component Value Date   WBC 9.2 01/07/2024   HGB 13.8 01/07/2024   HCT 43.5 01/07/2024   MCV 94.4 01/07/2024   PLT 275 01/07/2024     Chemistry      Component Value Date/Time   NA 142 01/07/2024 0845   NA 138 05/25/2023 1231   NA 140  04/20/2015 1050   K 5.1 01/07/2024 0845   K 3.9 04/20/2015 1050   CL 102 01/07/2024 0845   CL 103 11/12/2012 1549   CO2 33 (H) 01/07/2024 0845   CO2 26 04/20/2015 1050   BUN 16 01/07/2024 0845   BUN 13 05/25/2023 1231   BUN 13.1 04/20/2015 1050   CREATININE 1.25 (H) 01/07/2024 0845   CREATININE 1.1 04/20/2015 1050      Component Value Date/Time   CALCIUM  9.7 01/07/2024 0845   CALCIUM  8.9 04/20/2015 1050   ALKPHOS 105 01/07/2024 0845   ALKPHOS 87 04/20/2015 1050   AST 15 01/07/2024 0845   AST 23 04/20/2015 1050   ALT 6 01/07/2024 0845   ALT 16 04/20/2015 1050   BILITOT 0.5 01/07/2024 0845   BILITOT 0.26 04/20/2015 1050       Impression and Plan: Ms. Adcox is a very nice 78 year old white female.  She has numerous health issues.  Again, her bone marrow biopsy was unremarkable although the specimen could have been a little bit more adequate.  I think a PET scan would not be a bad idea for her.  We will see there is any activity on a PET scan that might warrant a biopsy.  She is very nice.  I really enjoyed talking with she and her husband.  I probably spent about 45 minutes with him.  Again, her labs are not that bad.  Her iron studies are not that bad.  I did state that she feels very fatigued and weak.  Will see about a PET scan in a couple weeks.  I will then plan to get her back in about a month.   Maude JONELLE Crease, MD 1/13/20251:27 PM

## 2024-01-08 ENCOUNTER — Ambulatory Visit: Payer: HMO | Admitting: Gastroenterology

## 2024-01-08 ENCOUNTER — Telehealth: Payer: Self-pay | Admitting: Gastroenterology

## 2024-01-08 DIAGNOSIS — R898 Other abnormal findings in specimens from other organs, systems and tissues: Secondary | ICD-10-CM | POA: Diagnosis not present

## 2024-01-08 DIAGNOSIS — G894 Chronic pain syndrome: Secondary | ICD-10-CM | POA: Diagnosis not present

## 2024-01-08 DIAGNOSIS — M5416 Radiculopathy, lumbar region: Secondary | ICD-10-CM | POA: Diagnosis not present

## 2024-01-08 NOTE — Telephone Encounter (Signed)
 That is okay I understand, thanks for letting me know

## 2024-01-08 NOTE — Progress Notes (Deleted)
 HPI :  Seen in May 8455:  78 year old female here for a follow-up visit.  Recall she has a history of chronic constipation, abdominal pain for which she follows with us .  She is also has a history of COPD, CAD, a flutter, breast cancer status post right breast lumpectomy and radiation in 2013, interstitial cystitis.   She is here today for abdominal pain and constipation.  She states she has had chronic constipation but lately she has been moving her bowels perhaps once every 7 to 8 days.  In between then she has a lot of spasms and soreness and bloating in her lower abdomen to left lower quadrant.  She does have a lot of bloating and gas that can bother her with this.  She currently takes MiraLAX  once daily.  Despite this she still has infrequent bowel movements.  She does have hard stool and can strain, had to use manual maneuvers once to help get stool out.  She states that is very rare.  She has used some enemas in the past.  She recalls she has seen pelvic floor PT in the past for this which she did find helpful but then COVID happened and she stopped going to them.  She would be interested in seeing them again.  She does think that Bentyl  can help with some of the spasms however she takes it routinely a few times daily.  She has been on chronic Cymbalta  and also has used some IBgard as needed which she does state can help with her pain as well.   She had a CT scan on March 4 for some of the symptoms which did not show any concerning pathology.  Small renal stone.  She has had some basic lab work in May which otherwise looked okay.       PAST GI PROCEDURES:    EGD 12/24/2018:  - Esophagogastric landmarks identified. - Normal esophagus - empiric dilation performed to 17mm with small mucosal wrent at the UES - Erythematous mucosa in the gastric body. - Normal duodenal bulb and second portion of the duodenum. - Biopsies were taken with a cold forceps for Helicobacter pylori testing.    Colonoscopy 12/24/2018: - One 4 mm polyp in the ascending colon, removed with a cold snare. Resected and retrieved. - One 4 mm polyp in the transverse colon, removed with a cold snare. Resected and retrieved. - One 4 mm polyp in the descending colon, removed with a cold snare. Resected and retrieved. - One 3 mm polyp in the sigmoid colon, removed with a cold snare. Resected and retrieved. - Anal papilla(e) were hypertrophied. Biopsied. - Tortuous colon, which prohibited ileal intubation. - Internal hemorrhoids. - Mostly adequate prep, fair in the cecum. - The examination was otherwise normal. No evidence of IBD or fistula. Suspect rectal bleeding is due to hemorrhoids in the setting of constipation. -3-year colonoscopy recall to be considered as the patient would be 75 at that point   1. Surgical [P], gastric antrum and gastric body - MILD REACTIVE GASTROPATHY. - NEGATIVE FOR HELICOBACTER PYLORI. - NO INTESTINAL METAPLASIA, DYSPLASIA, OR MALIGNANCY. 2. Surgical [P], transverse, ascending, sigmoid, polyp (2) - TUBULAR ADENOMA. - SESSILE SERRATED POLYP WITHOUT DYSPLASIA (X2 FRAGMENTS). - NO HIGH GRADE DYSPLASIA OR MALIGNANCY. 3. Surgical [P], anal papilla, polyp - POLYPOID FRAGMENT OF BENIGN SQUAMOUS MUCOSA. - NO DYSPLASIA OR MALIGNANCY.     EGD 03/22/22: - Esophagogastric landmarks were identified: the Z-line was found at 39 cm, the gastroesophageal junction was  found at 39 cm and the upper extent of the gastric folds was found at 40 cm from the incisors. Findings: - A 1 cm hiatal hernia was present. - The exam of the esophagus was otherwise normal. - A guidewire was placed and the scope was withdrawn. Empiric dilation was performed in the entire esophagus with a Savary dilator with mild resistance at 15 mm and then 17 mm (initially passage of 17mm felt tight, so started with 15mm first). Relook endoscopy showed appropriate small mucosal wrent just inferior to the UES. - The entire  examined stomach was normal. - The duodenal bulb and second portion of the duodenum were normal.   Colonoscopy 03/02/22: - The perianal and digital rectal examinations were normal. - A large amount of semi-liquid stool was found in the ascending colon and in the cecum, making visualization difficult initially. Lavage of the area was performed using copious amounts of sterile water , resulting in clearance with adequate visualization. - Anal papilla(e) were hypertrophied. - Internal hemorrhoids were found during retroflexion. The hemorrhoids were small. - The colon was tortuous with looping. Cecal intubation was technically challenging. - The exam was otherwise without abnormality. No overt evidence of fistula in the colon. No inflammatory changes - Biopsies for histology were taken with a cold forceps from the right colon, left colon and transverse colon for evaluation of microscopic colitis.     CT abdomen / pelvis 02/27/2023: IMPRESSION: 3 mm proximal left ureteral calculus, without hydronephrosis. No other acute findings.   Aortic Atherosclerosis (ICD10-I70.0).    78 y.o. female here for assessment of the following   1. Chronic constipation   2. Lower abdominal pain     Recent labs and CT scan reassuring.  Colonoscopy is up-to-date.  Her lower abdominal pain and bloating is very likely secondary to her chronic constipation which has worsened now compared to previous.  We discussed her regimen and options.  I would like to increase her MiraLAX  to twice daily and she can titrate that up or down as tolerated.  In addition, I will give her some samples of Linzess , 145 mcg/day to use as needed.  If she finds this is useful then she can contact us  for a prescription.  She reminds me she has seen pelvic floor PT in the past for this remotely and it did help, but she was lost to follow-up during COVID.  I will refer her back to pelvic floor PT and will see if this helps as well.  Otherwise I provided some  reassurance to her in regards to her recent imaging, colonoscopy, and labs.   In addition, she is using Bentyl  routinely which could be worsening constipation.  Recommend she stop that.  She should use IBgard as needed for bloating/pain, I think if she starts moving her bowels more frequently she will start feeling better soon.  She will keep me updated on her progress, we may need to tweak her regimen over time pending her response/results. She agreed,     PLAN: - increase miralax  to BID - samples of Linzess  159mcg/ day - to use PRN. Call if she wants to have a prescription. - refer her to pelvic floor PT - has seen remotely and helped in the past - stop Bentyl / dicyclomine , could be making constipation worse - use IB gard PRN or scheduled  - reassured her of normal CT, labs, and colonoscopy     Seen by oncology Abnormal MRI led to bone marrow bx PET scan pending 5  months ago IDA  Past Medical History:  Diagnosis Date   Abdominal pain 10/26/2013   Allergic rhinitis 01/25/2016   ANA positive 07/29/2013   Anemia    Arthralgia 08/18/2013   Arthritis    Asthma, allergic 08/05/2012   Atrial flutter    past history- not current   Atypical chest pain 01/01/2015   Bilateral carpal tunnel syndrome 03/04/2015   CAD in native artery 08/25/2010   Minor CAD 2009, 2011, low risk Myoview  2013 and 2016        Cataract    bilaterally removed    Cellulitis of breast 02/05/2023   Chronic constipation    Chronic cough 01/09/2017   Chronic interstitial cystitis 02/01/2017   Chronic night sweats 01/08/2015   Common bile duct dilatation 02/13/2023   Concussion    x 3   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   DDD (degenerative disc disease), cervical 09/17/2018   Degenerative scoliosis 04/22/2018   Deviated septum 05/12/2016   Dysphagia    Dyspnea    Dysuria 03/09/2021   Elevated alkaline phosphatase level 01/25/2023   Essential hypertension 08/25/2010   External hemorrhoids 02/05/2015    Facial trauma 01/22/2023   Family history of adverse reaction to anesthesia    Family history of malignant hyperthermia    father had this   Fibrocystic breast disease 10/18/2010   Fibromyalgia    Frequency of urination    Generalized anxiety disorder 08/03/2010   GERD (gastroesophageal reflux disease)    Headache 10/13/2013   High serum vitamin D  05/18/2021   History of chronic bronchitis    History of colonic polyp 02/25/2019   History of hiatal hernia    History of tachycardia    CONTROLLED  WITH ATENOLOL    Hyperglycemia 03/08/2021   Hyperkalemia 09/28/2022   Hyperlipidemia    Intertrigo 12/13/2022   Kidney stone 02/26/2019   Knee pain, right 04/30/2015   Left hip pain 04/30/2015   Leg pain 02/24/2011   Qualifier: Diagnosis of   By: Antonio DOJamee         Leukocytosis 07/05/2022   Local reaction to hymenoptera sting 05/09/2023   Low back pain 09/08/2020   Major depressive disorder 02/05/2013   Malignant neoplasm of lower-outer quadrant of right breast of female, estrogen receptor positive 01/03/2013   Nasal turbinate hypertrophy 05/12/2016   Oral herpes 02/05/2023   OSA and COPD overlap syndrome 06/10/2020   Osteoporosis 01/2019   T score -2.2 stable/improved from prior study   Pelvic pain    Peripheral neuropathy 05/22/2014   Personal history of radiation therapy    Pneumonia    Primary insomnia 06/25/2017   Pulmonary nodules 02/12/2015   Rash and other nonspecific skin eruption 05/09/2023   Recurrent falls 05/02/2020   Recurrent sinusitis 02/25/2019   Right arm pain 07/20/2016   RLS (restless legs syndrome) 11/09/2019   RUQ pain 07/05/2022   S/P radiation therapy 11/12/12 - 12/05/12   right Breast   Sciatica 04/30/2015   Sepsis 2014   from UTI    Sjogren's disease 12/12/2021   Splenic lesion 09/02/2012   Sprain of lateral ligament of ankle joint 07/31/2023   Stage 3 chronic kidney disease    Statin intolerance 02/29/2020   Stricture and stenosis of  esophagus 10/19/2011   Tongue lesion 02/05/2023   Tremor 07/05/2022   Urgency of urination    Vertigo 01/22/2023   Vitamin B12 deficiency 03/08/2021   Vocal cord cyst 05/18/2021     Past Surgical History:  Procedure Laterality Date   24 HOUR PH STUDY N/A 04/03/2016   Procedure: 24 HOUR PH STUDY;  Surgeon: Elspeth Deward Naval, MD;  Location: [facility];  Service: Gastroenterology;  Laterality: N/A;   BREAST BIOPSY Right 08/23/2012   ADH   BREAST BIOPSY Right 10/11/2012   Ductal Carcinoma   BREAST EXCISIONAL BIOPSY Right 09/04/2013   benign   BREAST LUMPECTOMY Right 10/11/2012   W/ [facility] BX   CARDIAC CATHETERIZATION  09-13-2007  DR MORRIS   WELL-PRESERVED LVF/  DIFFUSE SCATTERED CORONARY CALCIFACATION AND ATHEROSCLEROSIS WITHOUT OBSTRUCTION   CARDIAC CATHETERIZATION  08-04-2010  DR MCALHANY   NON-OBSTRUCTIVE CAD/  pLAD 40%/  oLAD 30%/  mLAD 30%/  pRCA 30%/  EF 60%   CARDIOVASCULAR STRESS TEST  06-18-2012  DR McALHANY   LOW RISK NUCLEAR STUDY/  SMALL FIXED AREA OF MODERATELY DECREASED UPTAKE IN ANTEROSEPTAL WALL WHICH MAY BE ARTIFACTUAL/  NO ISCHEMIA/  EF 68%   COLONOSCOPY  09/29/2010   CYSTOSCOPY     CYSTOSCOPY WITH HYDRODISTENSION AND BIOPSY N/A 03/06/2014   Procedure: CYSTOSCOPY/HYDRODISTENSION/ INSTILATION OF MARCAINE  AND PYRIDIUM ;  Surgeon: Arlena LILLETTE Gal, MD;  Location: [facility];  Service: Urology;  Laterality: N/A;   CYSTOSCOPY/URETEROSCOPY/HOLMIUM LASER/STENT PLACEMENT Left 03/21/2023   Procedure: CYSTOSCOPY LEFT RETROGRADE PYELOGRAM, LEFT URETEROSCOPY, HOLMIUM LASER LITHOTRIPSYM, AND LEFT URETERAL STENT PLACEMENT;  Surgeon: Devere Lonni Righter, MD;  Location: [facility];  Service: Urology;  Laterality: Left;  60 MINUTES   ESOPHAGEAL MANOMETRY N/A 04/03/2016   Procedure: ESOPHAGEAL MANOMETRY (EM);  Surgeon: Elspeth Deward Naval, MD;  Location: [facility];  Service: Gastroenterology;  Laterality: N/A;   EXTRACORPOREAL SHOCK WAVE LITHOTRIPSY  Left 02/06/2019   Procedure: EXTRACORPOREAL SHOCK WAVE LITHOTRIPSY (ESWL);  Surgeon: Ottelin, Mark, MD;  Location: [facility];  Service: Urology;  Laterality: Left;   NASAL SINUS SURGERY  1985   ORIF RIGHT ANKLE  FX  2006   POLYPECTOMY     REMOVAL VOCAL CORD CYST  01/2013   RIGHT BREAST BX  08/23/2012   RIGHT HAND SURGERY  X3  LAST ONE 2009   INCLUDES  ORIF RIGHT 5TH FINGER AND REVISION TWICE   SKIN CANCER EXCISION     face,arms   TONSILLECTOMY AND ADENOIDECTOMY  AGE 51   TOTAL ABDOMINAL HYSTERECTOMY W/ BILATERAL SALPINGOOPHORECTOMY  1982   W/  APPENDECTOMY   TRANSTHORACIC ECHOCARDIOGRAM  06/24/2012   GRADE I DIASTOLIC DYSFUNCTION/  EF 55-60%/  MILD MR   UPPER GASTROINTESTINAL ENDOSCOPY     Family History  Problem Relation Age of Onset   Rectal cancer Mother    Colon cancer Mother    Pancreatic cancer Mother    Diabetes Mother    Breast cancer Mother 50   Breast cancer Maternal Aunt        breast   Birth defects Maternal Aunt    Irritable bowel syndrome Son    Heart disease Son        CAD, MV replacement   Allergic Disorder Daughter    Diabetes Daughter    Colon cancer Father    Colon polyps Father    Diabetes Father    Stroke Father    Heart disease Father        CHF   Hyperlipidemia Father    Hypertension Father    Arthritis Father    Breast cancer Paternal Aunt    Breast cancer Paternal Aunt    Arthritis Paternal Uncle    Allergic Disorder Daughter    Heart disease Cousin  CAD, had a blood clot after stenting   Esophageal cancer Neg Hx    Stomach cancer Neg Hx    Social History   Tobacco Use   Smoking status: Former    Current packs/day: 0.00    Average packs/day: 1 pack/day for 15.0 years (15.0 ttl pk-yrs)    Types: Cigarettes    Start date: 05/27/1990    Quit date: 05/27/2005    Years since quitting: 18.6    Passive exposure: Never   Smokeless tobacco: Never  Vaping Use   Vaping status: Never Used  Substance Use Topics   Alcohol use: No     Alcohol/week: 0.0 standard drinks of alcohol   Drug use: No   Current Outpatient Medications  Medication Sig Dispense Refill   acetaminophen  (TYLENOL ) 500 MG tablet Take 1,000 mg by mouth every 6 (six) hours as needed for mild pain or headache.      albuterol  (VENTOLIN  HFA) 108 (90 Base) MCG/ACT inhaler Inhale 2 puffs into the lungs every 4 (four) hours as needed for wheezing or shortness of breath (coughing fits). 18 g 1   amLODipine  (NORVASC ) 5 MG tablet Take 5 mg by mouth daily.     aspirin  EC 81 MG tablet Take 1 tablet (81 mg total) by mouth daily. 90 tablet 3   atenolol  (TENORMIN ) 25 MG tablet Take 1 tablet (25 mg total) by mouth 2 (two) times daily. Take 1 tablet by mouth 2 time a day 180 tablet 3   carboxymethylcellul-glycerin (OPTIVE) 0.5-0.9 % ophthalmic solution Place 1 drop into both eyes daily as needed for dry eyes.     Cholecalciferol  (VITAMIN D3) 50 MCG (2000 UT) CAPS Take 2,000 Units by mouth daily.     cyanocobalamin  (VITAMIN B12) 1000 MCG tablet Take 1,000 mg by mouth daily with breakfast.     diclofenac Sodium (VOLTAREN) 1 % GEL Apply 2 g topically daily as needed (Back pain).     DULoxetine  (CYMBALTA ) 30 MG capsule Take 1 capsule (30 mg total) by mouth daily at 12 noon. 30 capsule 3   DULoxetine  (CYMBALTA ) 60 MG capsule Take 1 capsule (60 mg total) by mouth every morning. 90 capsule 1   EPINEPHrine  0.3 mg/0.3 mL IJ SOAJ injection Inject 0.3 mg into the muscle as needed for anaphylaxis. 2 each 1   famotidine  (PEPCID ) 20 MG tablet Take 1 tablet (20 mg total) by mouth 2 (two) times daily as needed for heartburn or indigestion. 60 tablet 1   fluticasone  (FLONASE ) 50 MCG/ACT nasal spray Use 2 spray(s) in each nostril once daily (Patient taking differently: Place 2 sprays into both nostrils daily as needed for allergies or rhinitis.) 16 g 5   fluticasone  (FLOVENT  HFA) 110 MCG/ACT inhaler Inhale 2 puffs into the lungs 2 (two) times daily. 1 each 5   folic acid  (FOLVITE ) 1 MG tablet  Take 1 tablet (1 mg total) by mouth daily.     furosemide  (LASIX ) 20 MG tablet TAKE 1 TABLET BY MOUTH AS NEEDED FOR EDEMA 90 tablet 3   gabapentin  (NEURONTIN ) 300 MG capsule Take 300-900 mg by mouth See admin instructions. Take 600 mg in the morning 300 mg at lunch time and 900 mg at bedtime     hydrOXYzine  (ATARAX ) 10 MG tablet Take 0.5-2 tablets (5-20 mg total) by mouth 3 (three) times daily as needed for anxiety (not with Loratadine ). 90 tablet 2   loratadine  (CLARITIN ) 10 MG tablet Take 10 mg by mouth daily as needed for rhinitis.  nitroGLYCERIN  (NITROSTAT ) 0.4 MG SL tablet Place 1 tablet (0.4 mg total) under the tongue every 5 (five) minutes x 3 doses as needed for chest pain. 25 tablet 2   nystatin  cream (MYCOSTATIN ) Apply 1 application topically 2 (two) times daily. (Patient taking differently: Apply 1 application  topically daily as needed (vaginal yeast).) 30 g 2   pantoprazole  (PROTONIX ) 40 MG tablet Take 40 mg by mouth 2 (two) times daily.     pentosan polysulfate (ELMIRON ) 100 MG capsule Take 1 capsule (100 mg total) by mouth 3 (three) times daily. Reported on 01/05/2016 (Patient taking differently: Take 100 mg by mouth daily as needed (burning and pain). Reported on 01/05/2016) 90 capsule 11   polyethylene glycol (MIRALAX ) 17 g packet Take 17 g by mouth 2 (two) times daily. 14 each 0   REPATHA  SURECLICK 140 MG/ML SOAJ INJECT 140 MG INTO THE SKIN EVERY 14 DAYS 6 mL 0   sucralfate  (CARAFATE ) 1 GM/10ML suspension Take 10 mLs (1 g total) by mouth 4 (four) times daily -  with meals and at bedtime. 420 mL 1   traMADol  (ULTRAM ) 50 MG tablet Take 1 tablet by mouth 3 (three) times daily as needed.     traZODone  (DESYREL ) 150 MG tablet Take 1 tablet (150 mg total) by mouth at bedtime. 30 tablet 3   Vitamin D , Ergocalciferol , (DRISDOL ) 1.25 MG (50000 UNIT) CAPS capsule Take 1 capsule (50,000 Units total) by mouth every 7 (seven) days. 12x Weeks 5 capsule 3   No current facility-administered  medications for this visit.   Allergies  Allergen Reactions   Clindamycin Hcl Shortness Of Breath and Rash   Penicillins Anaphylaxis   Rosuvastatin  Anaphylaxis   Baclofen  Rash   Codeine Hives and Nausea Only   Erythromycin Hives   Latex Hives   Lincomycin Other (See Comments)   Pentazocine Lactate Other (See Comments)    HALLUCINATION   Pneumococcal Vaccine Polyvalent Hives, Swelling and Other (See Comments)    REACTION: swelling at injection site   Prednisone  Rash    Other reaction(s): Rash, Hives - can tolerate if she takes with benadryl     Tamoxifen  Nausea And Vomiting   Haemophilus Influenzae Other (See Comments)    Local reaction at the site    Haemophilus Influenzae Vaccines Other (See Comments)    Local reaction at site   Levofloxacin      Other reaction(s): sick   Other Other (See Comments)    Father had malignant hyperthermia   Doxycycline  Rash   Fluzone [Influenza Virus Vaccine] Hives    Local reaction at the site     Review of Systems: All systems reviewed and negative except where noted in HPI.    CT BONE MARROW BIOPSY & ASPIRATION Result Date: 12/13/2023 INDICATION: Abnormal MRI. Please perform image guided bone marrow biopsy to evaluate for myeloma. EXAM: CT-GUIDED BONE MARROW BIOPSY AND ASPIRATION MEDICATIONS: None ANESTHESIA/SEDATION: Moderate (conscious) sedation was employed during this procedure as administered by the Interventional Radiology RN. A total of Versed  2 mg and Fentanyl  100 mcg was administered intravenously. Moderate Sedation Time: 10 minutes. The patient's level of consciousness and vital signs were monitored continuously by radiology nursing throughout the procedure under my direct supervision. COMPLICATIONS: None immediate. PROCEDURE: Informed consent was obtained from the patient following an explanation of the procedure, risks, benefits and alternatives. The patient understands, agrees and consents for the procedure. All questions were  addressed. A time out was performed prior to the initiation of the procedure. The patient  was positioned prone and non-contrast localization CT was performed of the pelvis to demonstrate the iliac marrow spaces. The operative site was prepped and draped in the usual sterile fashion. Under sterile conditions and local anesthesia, a 22 gauge spinal needle was utilized for procedural planning. Next, an 11 gauge coaxial bone biopsy needle was advanced into the left iliac marrow space. Needle position was confirmed with CT imaging. Initially, a bone marrow aspiration was performed. Next, a bone marrow biopsy was obtained with the 11 gauge outer bone marrow device. The 11 gauge coaxial bone biopsy needle was re-advanced into a slightly different location within the left iliac marrow space, positioning was confirmed with CT imaging and an additional bone marrow biopsy was obtained. The needle was removed and superficial hemostasis was obtained with manual compression. A dressing was applied. The patient tolerated the procedure well without immediate post procedural complication. IMPRESSION: Successful CT guided left iliac bone marrow aspiration and core biopsy. Electronically Signed   By: Norleen Roulette M.D.   On: 12/13/2023 13:42    Physical Exam: There were no vitals taken for this visit. Constitutional: Pleasant,well-developed, ***female in no acute distress. HEENT: Normocephalic and atraumatic. Conjunctivae are normal. No scleral icterus. Neck supple.  Cardiovascular: Normal rate, regular rhythm.  Pulmonary/chest: Effort normal and breath sounds normal. No wheezing, rales or rhonchi. Abdominal: Soft, nondistended, nontender. Bowel sounds active throughout. There are no masses palpable. No hepatomegaly. Extremities: no edema Lymphadenopathy: No cervical adenopathy noted. Neurological: Alert and oriented to person place and time. Skin: Skin is warm and dry. No rashes noted. Psychiatric: Normal mood and  affect. Behavior is normal.   ASSESSMENT: 78 y.o. female here for assessment of the following  No diagnosis found.  PLAN:   Domenica Harlene LABOR, MD

## 2024-01-08 NOTE — Telephone Encounter (Signed)
 Good morning Dr. Leigh,   Patient called stating she had an appointment with you this morning at 10:30 and was on her way here and she got lost and ended up in [place]. Patient states she is having issues with her memory and is very sorry she did not make her appointment.   Patient was rescheduled with Vina on 1/30 at 11:00

## 2024-01-10 ENCOUNTER — Encounter: Payer: Self-pay | Admitting: Emergency Medicine

## 2024-01-10 ENCOUNTER — Encounter: Payer: Self-pay | Admitting: Professional

## 2024-01-10 ENCOUNTER — Ambulatory Visit: Payer: HMO | Admitting: Emergency Medicine

## 2024-01-10 ENCOUNTER — Ambulatory Visit: Payer: HMO | Admitting: Professional

## 2024-01-10 ENCOUNTER — Ambulatory Visit: Payer: Medicare Other | Admitting: Emergency Medicine

## 2024-01-10 VITALS — BP 101/59 | HR 72 | Ht 65.0 in | Wt 126.8 lb

## 2024-01-10 DIAGNOSIS — K219 Gastro-esophageal reflux disease without esophagitis: Secondary | ICD-10-CM

## 2024-01-10 DIAGNOSIS — F411 Generalized anxiety disorder: Secondary | ICD-10-CM | POA: Diagnosis not present

## 2024-01-10 DIAGNOSIS — J309 Allergic rhinitis, unspecified: Secondary | ICD-10-CM

## 2024-01-10 DIAGNOSIS — F331 Major depressive disorder, recurrent, moderate: Secondary | ICD-10-CM

## 2024-01-10 DIAGNOSIS — J4489 Other specified chronic obstructive pulmonary disease: Secondary | ICD-10-CM | POA: Diagnosis not present

## 2024-01-10 NOTE — Assessment & Plan Note (Signed)
Continue current regimen of fluticasone nasal spray and Atarax.

## 2024-01-10 NOTE — Assessment & Plan Note (Signed)
She tolerated discontinuation of her Flovent.  Minimal albuterol use although she acknowledges that she has not been active because of back pain and fatigue in the setting of her workup for possible myeloma.  We will hold off on ICS for now, keep albuterol available to use if needed.  Continue to control contributors to her upper airway disease and her asthma including GERD and rhinitis.

## 2024-01-10 NOTE — Progress Notes (Signed)
[facility] Counselor/Therapist Progress Note  Patient ID: [name], MRN: [id number],    Date: 01/10/2024  Time Spent: 54 minutes 906-10am   Treatment Type: Individual Therapy  Risk Assessment: Danger to Self:  No Self-injurious Behavior: No Danger to Others: No  Subjective: Patient arrived late for her in-person session due to having been sent to the wrong location.  Issues addressed: 1-physical -she does not have multiple myeloma -there are other things they are looking for I the blood -there are more tests needed -her daughters are saying that she is a hypochondriac   -that she should not waste the money   -you have mental disease because she older daughter thinks she is not being honest -daughter [name] comments that she is 43 and has aches and pains and still gets up and does what she needs to do -thinks daughter worries about her spending the inheritance since she has literally made the comment -husband [name] got on phone with daughter and told her that he would not permit her to speak to her mother -her youngest daughter [name] believes that since her mother birthed her that the pt needs to take care of her the rest of her life -pt has cried and cried and talked to her best friend -her daughters are still entitled -mother recalls really wanting to be pregnant with [name] and she was born with terrible allergies and her [name]'s parents divorce -[name] was born and her father died when she was young -pt admits that she used money to try and compensate for what they didn't have  Treatment Plan:   Goals Alleviate depressive symptoms Recognize, accept, and cope with depressive feelings Develop healthy thinking patterns Develop healthy interpersonal relationships (particularly younger daughter) Reduce overall frequency, intensity, and duration of anxiety Stabilize anxiety level while increasing ability to function Enhance ability to effectively cope with  full variety of stressors Learn and implement coping skills that result in a reduction of anxiety    Objectives target date for all objectives is 09/26/2024 Verbalize an understanding of the cognitive, physiological, and behavioral components of anxiety       60 Learning and implement calming skills to reduce overall anxiety           60 Verbalize an understanding of the role that cognitive biases play in excessive irrational worry and persistent anxiety symptoms   70 Identify, challenge, and replace biased fearful self-talk            60 Learn and implement problem solving strategies             70 Identify and engage in pleasant activities              80 Learning and implement personal and interpersonal skills to reduce anxiety and improve interpersonal relationships    60 Learn to accept limitations in life and commit to tolerating, rather than avoiding, unpleasant emotions while accomplishing meaningful goals 60 Identify major life conflicts from the past and present that form the basis for present anxiety       70 Maintain involvement in work, family, and social activities           70 Reestablish a consistent sleep-wake cycle             50 Verbalize an accurate understanding of depression            60 Identify and replace thoughts that support depression  50 Learn and implement behavioral strategies Verbalize an understanding and resolution of current interpersonal problems Learn and implement decision making skills Learn and implement conflict resolution skills to resolve interpersonal problems Verbalize an understanding of healthy and unhealthy emotions verbalize insight into how past relationships may be influence current experiences with depression Use mindfulness and acceptance strategies and increase value based behavior  Increase hopeful statements about the future.  Interventions Engage the patient in behavioral activation Use instruction, modeling, and  role-playing to build the patient 's general social, communication, and/or conflict resolution skills Use Acceptance and Commitment Therapy to help patient  accept uncomfortable realities in order to accomplish value-consistent goals Reinforce the patient 's insight into the role of her past emotional pain and present anxiety  Support the patient in following through with work, family, and social activities Teach and implement sleep hygiene practices  Teach the patient relaxation skills Assign the patient homework Discuss examples demonstrating that unrealistic worry overestimates the probability of threats and underestimates patient's ability  Assist the patient in analyzing his or her worries Help patient understand that avoidance is reinforcing  Consistent with treatment model, discuss how change in cognitive, behavioral, and interpersonal can help patient alleviate depression CBT Behavioral activation to help the patient explore the relationship, nature of the dispute Help the patient develop new interpersonal skills and relationships Conduct Problem solving therapy Teach conflict resolution skills Use a process-experiential approach Conduct ACT  Diagnosis:Major depressive disorder, recurrent episode, moderate (HCC)  Generalized anxiety disorder  Plan:  -meet on Thursday, January 17, 2024 at [employer].

## 2024-01-10 NOTE — Patient Instructions (Addendum)
Please keep your albuterol available use 2 puffs when needed for shortness of breath, chest tightness, wheezing. Continue your fluticasone nasal spray and your Atarax as you have been taking them Continue pantoprazole as you have been taking it Continue to follow with Dr. Rogelia Rohrer and Dr. Myna Hidalgo Flu shot deferred due to your allergy Follow Dr. Delton Coombes in 1 year, sooner if you have any problems.

## 2024-01-10 NOTE — Assessment & Plan Note (Signed)
On PPI twice daily

## 2024-01-10 NOTE — Progress Notes (Signed)
   Subjective:    Patient ID: [name], female    DOB: May 26, 1946, 78 y.o.   MRN: [id number]  HPI  ROV 12/05/2023 --[name] is 18 with a history of former tobacco use, breast cancer, hypertension, CAD, allergic rhinitis.  She was on Plaquenil due to ? Sjogren's and suspected FM - off of this now.  Have seen her for asthma and associated chronic cough.  She also has a history of vocal cord hemangioma.  Over the summer she had a flare of coughing and chronic bronchitic symptoms that was slow to resolve.  She was started on Advair in July, was treated with prednisone.  Then she started using flovent but only prn. It doesn't sound like she is using any (or has any) albuterol. Also on fluticasone is spray, atarax, Protonix twice daily, guaifenesin.  ROV 01/10/2024 --follow-up visit for 78 year old woman with a history of former tobacco use, allergic rhinitis, asthma and chronic cough.  PMH also significant for breast cancer, hypertension, CAD, Sjogren's/fibromyalgia.  Finally she has a history of a vocal cord hemangioma. I saw her in December at which time she was only using her Flovent on as-needed basis.  We went ahead and tried stopping it to see if she could tolerate.   She is on fluticasone nasal spray, Atarax, pantoprazole twice daily for her contributors to cough She reports today she is doing well, although her functional capacity is limited by back pain and fatigue. There is suspicion that she might have myeloma. Her allergies are under control.     Review of Systems As per HPI      Objective:   Physical Exam  Vitals:   01/10/24 1450  BP: (!) 101/59  Pulse: 72  SpO2: 93%  Weight: 126 lb 12.8 oz (57.5 kg)  Height: 5\' 5"  (1.651 m)    Gen: Pleasant, well-nourished, in no distress,  normal affect  ENT: No lesions,  mouth clear,  oropharynx clear, no postnasal drip, somewhat hoarse voice  Neck: No JVD, no stridor  Lungs: No use of accessory muscles, no crackles or wheezing on  normal respiration, no wheeze on forced expiration  Cardiovascular: RRR, late systolic murmur with intact S2  Musculoskeletal: No deformities, no cyanosis or clubbing.    Neuro: alert, awake, non focal  Skin: Warm, no lesions or rashes     Assessment & Plan:  COPD with asthma She tolerated discontinuation of her Flovent.  Minimal albuterol use although she acknowledges that she has not been active because of back pain and fatigue in the setting of her workup for possible myeloma.  We will hold off on ICS for now, keep albuterol available to use if needed.  Continue to control contributors to her upper airway disease and her asthma including GERD and rhinitis.  Allergic rhinitis Continue current regimen of fluticasone nasal spray and Atarax.  GERD (gastroesophageal reflux disease) On PPI twice daily    Levy Pupa, MD, PhD 01/10/2024, 3:04 PM [facility] [phone] or if no answer before 7:00PM call [phone] For any issues after 7:00PM please call eLink [phone]

## 2024-01-11 ENCOUNTER — Ambulatory Visit (INDEPENDENT_AMBULATORY_CARE_PROVIDER_SITE_OTHER): Payer: HMO | Admitting: Family

## 2024-01-11 VITALS — BP 139/68 | HR 82 | Resp 16 | Ht 65.0 in | Wt 127.0 lb

## 2024-01-11 DIAGNOSIS — N3000 Acute cystitis without hematuria: Secondary | ICD-10-CM | POA: Diagnosis not present

## 2024-01-11 DIAGNOSIS — R4182 Altered mental status, unspecified: Secondary | ICD-10-CM | POA: Diagnosis not present

## 2024-01-11 DIAGNOSIS — F418 Other specified anxiety disorders: Secondary | ICD-10-CM | POA: Diagnosis not present

## 2024-01-11 LAB — COMPREHENSIVE METABOLIC PANEL
ALT: 7 U/L (ref 0–35)
AST: 15 U/L (ref 0–37)
Albumin: 3.9 g/dL (ref 3.5–5.2)
Alkaline Phosphatase: 113 U/L (ref 39–117)
BUN: 15 mg/dL (ref 6–23)
CO2: 32 meq/L (ref 19–32)
Calcium: 8.8 mg/dL (ref 8.4–10.5)
Chloride: 101 meq/L (ref 96–112)
Creatinine, Ser: 0.91 mg/dL (ref 0.40–1.20)
GFR: 60.42 mL/min (ref >60.00)
Glucose, Bld: 97 mg/dL (ref 70–99)
Potassium: 4.2 meq/L (ref 3.5–5.1)
Sodium: 139 meq/L (ref 135–145)
Total Bilirubin: 0.4 mg/dL (ref 0.2–1.2)
Total Protein: 6 g/dL (ref 6.0–8.3)

## 2024-01-11 LAB — POC URINALSYSI DIPSTICK (AUTOMATED)
Blood, UA: NEGATIVE
Glucose, UA: NEGATIVE
Nitrite, UA: NEGATIVE
Protein, UA: POSITIVE — AB
Spec Grav, UA: 1.01 (ref 1.010–1.025)
Urobilinogen, UA: 0.2 U/dL
pH, UA: 6 (ref 5.0–8.0)

## 2024-01-11 LAB — VITAMIN B12: Vitamin B-12: 296 pg/mL (ref 211–911)

## 2024-01-11 LAB — TSH: TSH: 1.7 u[IU]/mL (ref 0.35–5.50)

## 2024-01-11 MED ORDER — SULFAMETHOXAZOLE-TRIMETHOPRIM 800-160 MG PO TABS: 1.0000 | ORAL_TABLET | Freq: Two times a day (BID) | ORAL | 0 refills | Status: DC

## 2024-01-11 NOTE — Progress Notes (Signed)
Subjective:     Patient ID: [name], female    DOB: 06/23/1946, 78 y.o.   MRN: [id number]  Chief Complaint  Patient presents with   Altered Mental Status    Patient reports having altered mental status, here to r/o UTI    HPI  Discussed the use of AI scribe software for clinical note transcription with the patient, who gave verbal consent to proceed.  History of Present Illness    The patient presents with a recent onset of confusion, hearing voices, difficulty voiding, and leg swelling. She reports feeling 'not right' and has been experiencing episodes of confusion, including hearing her daughter's voice singing when she was not present and getting lost while driving. She also reports difficulty voiding, only passing a small amount of urine at a time, and swelling in her legs and feet, which she believes is due to fluid retention. She also reports occasional fevers in the afternoon with a Tmax 99.9.   The patient has a history of depression, which she currently rates as a 4 or 5 out of 10 (10 being the worst), and she reports crying easily. She also reports stumbling and falling, and difficulty remembering words. She has noticed these memory issues for a while, but they seem to have worsened recently.  The patient also reports back pain and has been seeing [facility] for this.  An MRI of her spine revealed diffuse heterogeneous marrow signal on T1... without fracture. which was initially suspected to be multiple myeloma. She is seeing heme/onc and underwent a bone marrow biopsy which unremarkable. They are trying to get a PET scan authorized for further evaluation. She is currently waiting to see if she qualifies for a PET scan.  The patient had a urinary tract infection about six to eight weeks ago, which was treated with Pyridium and Cipro by [facility], but she states a culture was not performed at that time. She believes her current urinary symptoms may be related to this  previous infection.           Health Maintenance Due  Topic Date Due   MAMMOGRAM  08/26/2023   COVID-19 Vaccine (5 - 2024-25 season) 08/26/2023   HEMOGLOBIN A1C  11/27/2023   Medicare Annual Wellness (AWV)  03/04/2024    Past Medical History:  Diagnosis Date   Abdominal pain 10/26/2013   Allergic rhinitis 01/25/2016   ANA positive 07/29/2013   Anemia    Arthralgia 08/18/2013   Arthritis    Asthma, allergic 08/05/2012   Atrial flutter    past history- not current   Atypical chest pain 01/01/2015   Bilateral carpal tunnel syndrome 03/04/2015   CAD in native artery 08/25/2010   Minor CAD 2009, 2011, low risk Myoview 2013 and 2016        Cataract    bilaterally removed    Cellulitis of breast 02/05/2023   Chronic constipation    Chronic cough 01/09/2017   Chronic interstitial cystitis 02/01/2017   Chronic night sweats 01/08/2015   Common bile duct dilatation 02/13/2023   Concussion    x 3   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   DDD (degenerative disc disease), cervical 09/17/2018   Degenerative scoliosis 04/22/2018   Deviated septum 05/12/2016   Dysphagia    Dyspnea    Dysuria 03/09/2021   Elevated alkaline phosphatase level 01/25/2023   Essential hypertension 08/25/2010   External hemorrhoids 02/05/2015   Facial trauma 01/22/2023   Family history of adverse reaction to  anesthesia    Family history of malignant hyperthermia    father had this   Fibrocystic breast disease 10/18/2010   Fibromyalgia    Frequency of urination    Generalized anxiety disorder 08/03/2010   GERD (gastroesophageal reflux disease)    Headache 10/13/2013   High serum vitamin D 05/18/2021   History of chronic bronchitis    History of colonic polyp 02/25/2019   History of hiatal hernia    History of tachycardia    CONTROLLED  WITH ATENOLOL   Hyperglycemia 03/08/2021   Hyperkalemia 09/28/2022   Hyperlipidemia    Intertrigo 12/13/2022   Kidney stone 02/26/2019   Knee pain, right  04/30/2015   Left hip pain 04/30/2015   Leg pain 02/24/2011   Qualifier: Diagnosis of   By: Laury Axon DOMyrene Buddy         Leukocytosis 07/05/2022   Local reaction to hymenoptera sting 05/09/2023   Low back pain 09/08/2020   Major depressive disorder 02/05/2013   Malignant neoplasm of lower-outer quadrant of right breast of female, estrogen receptor positive 01/03/2013   Nasal turbinate hypertrophy 05/12/2016   Oral herpes 02/05/2023   OSA and COPD overlap syndrome 06/10/2020   Osteoporosis 01/2019   T score -2.2 stable/improved from prior study   Pelvic pain    Peripheral neuropathy 05/22/2014   Personal history of radiation therapy    Pneumonia    Primary insomnia 06/25/2017   Pulmonary nodules 02/12/2015   Rash and other nonspecific skin eruption 05/09/2023   Recurrent falls 05/02/2020   Recurrent sinusitis 02/25/2019   Right arm pain 07/20/2016   RLS (restless legs syndrome) 11/09/2019   RUQ pain 07/05/2022   S/P radiation therapy 11/12/12 - 12/05/12   right Breast   Sciatica 04/30/2015   Sepsis 2014   from UTI    Sjogren's disease 12/12/2021   Splenic lesion 09/02/2012   Sprain of lateral ligament of ankle joint 07/31/2023   Stage 3 chronic kidney disease    Statin intolerance 02/29/2020   Stricture and stenosis of esophagus 10/19/2011   Tongue lesion 02/05/2023   Tremor 07/05/2022   Urgency of urination    Vertigo 01/22/2023   Vitamin B12 deficiency 03/08/2021   Vocal cord cyst 05/18/2021    Past Surgical History:  Procedure Laterality Date   24 HOUR PH STUDY N/A 04/03/2016   Procedure: 24 HOUR PH STUDY;  Surgeon: Ruffin Frederick, MD;  Location: [facility];  Service: Gastroenterology;  Laterality: N/A;   BREAST BIOPSY Right 08/23/2012   ADH   BREAST BIOPSY Right 10/11/2012   Ductal Carcinoma   BREAST EXCISIONAL BIOPSY Right 09/04/2013   benign   BREAST LUMPECTOMY Right 10/11/2012   W/ [facility]N BX   CARDIAC CATHETERIZATION  09-13-2007  DR Riley Kill    WELL-PRESERVED LVF/  DIFFUSE SCATTERED CORONARY CALCIFACATION AND ATHEROSCLEROSIS WITHOUT OBSTRUCTION   CARDIAC CATHETERIZATION  08-04-2010  DR MCALHANY   NON-OBSTRUCTIVE CAD/  pLAD 40%/  oLAD 30%/  mLAD 30%/  pRCA 30%/  EF 60%   CARDIOVASCULAR STRESS TEST  06-18-2012  DR McALHANY   LOW RISK NUCLEAR STUDY/  SMALL FIXED AREA OF MODERATELY DECREASED UPTAKE IN ANTEROSEPTAL WALL WHICH MAY BE ARTIFACTUAL/  NO ISCHEMIA/  EF 68%   COLONOSCOPY  09/29/2010   CYSTOSCOPY     CYSTOSCOPY WITH HYDRODISTENSION AND BIOPSY N/A 03/06/2014   Procedure: CYSTOSCOPY/HYDRODISTENSION/ INSTILATION OF MARCAINE AND PYRIDIUM;  Surgeon: Kathi Ludwig, MD;  Location: [facility];  Service: Urology;  Laterality: N/A;   CYSTOSCOPY/URETEROSCOPY/HOLMIUM  LASER/STENT PLACEMENT Left 03/21/2023   Procedure: CYSTOSCOPY LEFT RETROGRADE PYELOGRAM, LEFT URETEROSCOPY, HOLMIUM LASER LITHOTRIPSYM, AND LEFT URETERAL STENT PLACEMENT;  Surgeon: Rene Paci, MD;  Location: [facility];  Service: Urology;  Laterality: Left;  60 MINUTES   ESOPHAGEAL MANOMETRY N/A 04/03/2016   Procedure: ESOPHAGEAL MANOMETRY (EM);  Surgeon: Ruffin Frederick, MD;  Location: [facility];  Service: Gastroenterology;  Laterality: N/A;   EXTRACORPOREAL SHOCK WAVE LITHOTRIPSY Left 02/06/2019   Procedure: EXTRACORPOREAL SHOCK WAVE LITHOTRIPSY (ESWL);  Surgeon: Ihor Gully, MD;  Location: [facility];  Service: Urology;  Laterality: Left;   NASAL SINUS SURGERY  1985   ORIF RIGHT ANKLE  FX  2006   POLYPECTOMY     REMOVAL VOCAL CORD CYST  01/2013   RIGHT BREAST BX  08/23/2012   RIGHT HAND SURGERY  X3  LAST ONE 2009   INCLUDES  ORIF RIGHT 5TH FINGER AND REVISION TWICE   SKIN CANCER EXCISION     face,arms   TONSILLECTOMY AND ADENOIDECTOMY  AGE 27   TOTAL ABDOMINAL HYSTERECTOMY W/ BILATERAL SALPINGOOPHORECTOMY  1982   W/  APPENDECTOMY   TRANSTHORACIC ECHOCARDIOGRAM  06/24/2012   GRADE I DIASTOLIC DYSFUNCTION/  EF 55-60%/  MILD MR    UPPER GASTROINTESTINAL ENDOSCOPY      Family History  Problem Relation Age of Onset   Rectal cancer Mother    Colon cancer Mother    Pancreatic cancer Mother    Diabetes Mother    Breast cancer Mother 41   Breast cancer Maternal Aunt        breast   Birth defects Maternal Aunt    Irritable bowel syndrome Son    Heart disease Son        CAD, MV replacement   Allergic Disorder Daughter    Diabetes Daughter    Colon cancer Father    Colon polyps Father    Diabetes Father    Stroke Father    Heart disease Father        CHF   Hyperlipidemia Father    Hypertension Father    Arthritis Father    Breast cancer Paternal Aunt    Breast cancer Paternal Aunt    Arthritis Paternal Uncle    Allergic Disorder Daughter    Heart disease Cousin        CAD, had a blood clot after stenting   Esophageal cancer Neg Hx    Stomach cancer Neg Hx     Social History   Socioeconomic History   Marital status: Married    Spouse name: Windy Fast    Number of children: 3   Years of education: 16   Highest education level: Bachelor's degree (e.g., BA, AB, BS)  Occupational History   Occupation: Retired    Comment: Charity fundraiser  Tobacco Use   Smoking status: Former    Current packs/day: 0.00    Average packs/day: 1 pack/day for 15.0 years (15.0 ttl pk-yrs)    Types: Cigarettes    Start date: 05/27/1990    Quit date: 05/27/2005    Years since quitting: 18.6    Passive exposure: Never   Smokeless tobacco: Never  Vaping Use   Vaping status: Never Used  Substance and Sexual Activity   Alcohol use: No    Alcohol/week: 0.0 standard drinks of alcohol   Drug use: No   Sexual activity: Not Currently    Partners: Male    Birth control/protection: Surgical    Comment: 1st intercourse 78 yo-Fewer than 5 partners, hysterectomy  Other Topics Concern   Not on file  Social History Narrative   Lives with husband.   Caffeine use: 1/2 cup per day   Exercise-- 2days a week [employer],  Water aerobics, walking        Retired Engineer, civil (consulting)   No dietary restrictions, tries to maintain a heart healthy diet   Very pleasant lady   Social Drivers of Corporate investment banker Strain: Low Risk  (12/07/2023)   Overall Financial Resource Strain (CARDIA)    Difficulty of Paying Living Expenses: Not hard at all  Food Insecurity: No Food Insecurity (12/07/2023)   Hunger Vital Sign    Worried About Running Out of Food in the Last Year: Never true    Ran Out of Food in the Last Year: Never true  Transportation Needs: No Transportation Needs (12/07/2023)   PRAPARE - Administrator, Civil Service (Medical): No    Lack of Transportation (Non-Medical): No  Physical Activity: Inactive (12/07/2023)   Exercise Vital Sign    Days of Exercise per Week: 0 days    Minutes of Exercise per Session: 20 min  Stress: No Stress Concern Present (12/07/2023)   [employer] of Occupational Health - Occupational Stress Questionnaire    Feeling of Stress : Only a little  Recent Concern: Stress - Stress Concern Present (10/20/2023)   [employer] of Occupational Health - Occupational Stress Questionnaire    Feeling of Stress : To some extent  Social Connections: Socially Integrated (12/07/2023)   Social Connection and Isolation Panel [NHANES]    Frequency of Communication with Friends and Family: More than three times a week    Frequency of Social Gatherings with Friends and Family: Twice a week    Attends Religious Services: More than 4 times per year    Active Member of [employer] or Organizations: Yes    Attends Engineer, structural: More than 4 times per year    Marital Status: Married  Catering manager Violence: Not At Risk (09/12/2023)   Humiliation, Afraid, Rape, and Kick questionnaire    Fear of Current or Ex-Partner: No    Emotionally Abused: No    Physically Abused: No    Sexually Abused: No    Outpatient Medications Prior to Visit  Medication Sig Dispense Refill   acetaminophen (TYLENOL) 500  MG tablet Take 1,000 mg by mouth every 6 (six) hours as needed for mild pain or headache.      albuterol (VENTOLIN HFA) 108 (90 Base) MCG/ACT inhaler Inhale 2 puffs into the lungs every 4 (four) hours as needed for wheezing or shortness of breath (coughing fits). 18 g 1   amLODipine (NORVASC) 5 MG tablet Take 5 mg by mouth daily.     aspirin EC 81 MG tablet Take 1 tablet (81 mg total) by mouth daily. 90 tablet 3   atenolol (TENORMIN) 25 MG tablet Take 1 tablet (25 mg total) by mouth 2 (two) times daily. Take 1 tablet by mouth 2 time a day 180 tablet 3   carboxymethylcellul-glycerin (OPTIVE) 0.5-0.9 % ophthalmic solution Place 1 drop into both eyes daily as needed for dry eyes.     Cholecalciferol (VITAMIN D3) 50 MCG (2000 UT) CAPS Take 2,000 Units by mouth daily.     cyanocobalamin (VITAMIN B12) 1000 MCG tablet Take 1,000 mg by mouth daily with breakfast.     diclofenac Sodium (VOLTAREN) 1 % GEL Apply 2 g topically daily as needed (Back pain).     DULoxetine (CYMBALTA)  30 MG capsule Take 1 capsule (30 mg total) by mouth daily at 12 noon. 30 capsule 3   DULoxetine (CYMBALTA) 60 MG capsule Take 1 capsule (60 mg total) by mouth every morning. 90 capsule 1   EPINEPHrine 0.3 mg/0.3 mL IJ SOAJ injection Inject 0.3 mg into the muscle as needed for anaphylaxis. 2 each 1   famotidine (PEPCID) 20 MG tablet Take 1 tablet (20 mg total) by mouth 2 (two) times daily as needed for heartburn or indigestion. 60 tablet 1   fluticasone (FLONASE) 50 MCG/ACT nasal spray Use 2 spray(s) in each nostril once daily (Patient taking differently: Place 2 sprays into both nostrils daily as needed for allergies or rhinitis.) 16 g 5   fluticasone (FLOVENT HFA) 110 MCG/ACT inhaler Inhale 2 puffs into the lungs 2 (two) times daily. 1 each 5   folic acid (FOLVITE) 1 MG tablet Take 1 tablet (1 mg total) by mouth daily.     furosemide (LASIX) 20 MG tablet TAKE 1 TABLET BY MOUTH AS NEEDED FOR EDEMA 90 tablet 3   gabapentin (NEURONTIN)  300 MG capsule Take 300-900 mg by mouth See admin instructions. Take 600 mg in the morning 300 mg at lunch time and 900 mg at bedtime     hydrOXYzine (ATARAX) 10 MG tablet Take 0.5-2 tablets (5-20 mg total) by mouth 3 (three) times daily as needed for anxiety (not with Loratadine). 90 tablet 2   loratadine (CLARITIN) 10 MG tablet Take 10 mg by mouth daily as needed for rhinitis.     nitroGLYCERIN (NITROSTAT) 0.4 MG SL tablet Place 1 tablet (0.4 mg total) under the tongue every 5 (five) minutes x 3 doses as needed for chest pain. 25 tablet 2   nystatin cream (MYCOSTATIN) Apply 1 application topically 2 (two) times daily. (Patient taking differently: Apply 1 application  topically daily as needed (vaginal yeast).) 30 g 2   pantoprazole (PROTONIX) 40 MG tablet Take 40 mg by mouth 2 (two) times daily.     pentosan polysulfate (ELMIRON) 100 MG capsule Take 1 capsule (100 mg total) by mouth 3 (three) times daily. Reported on 01/05/2016 (Patient taking differently: Take 100 mg by mouth daily as needed (burning and pain). Reported on 01/05/2016) 90 capsule 11   polyethylene glycol (MIRALAX) 17 g packet Take 17 g by mouth 2 (two) times daily. 14 each 0   REPATHA SURECLICK 140 MG/ML SOAJ INJECT 140 MG INTO THE SKIN EVERY 14 DAYS 6 mL 0   sucralfate (CARAFATE) 1 GM/10ML suspension Take 10 mLs (1 g total) by mouth 4 (four) times daily -  with meals and at bedtime. 420 mL 1   traMADol (ULTRAM) 50 MG tablet Take 1 tablet by mouth 3 (three) times daily as needed.     traZODone (DESYREL) 150 MG tablet Take 1 tablet (150 mg total) by mouth at bedtime. 30 tablet 3   Vitamin D, Ergocalciferol, (DRISDOL) 1.25 MG (50000 UNIT) CAPS capsule Take 1 capsule (50,000 Units total) by mouth every 7 (seven) days. 12x Weeks 5 capsule 3   No facility-administered medications prior to visit.    Allergies  Allergen Reactions   Clindamycin Hcl Shortness Of Breath and Rash   Penicillins Anaphylaxis   Rosuvastatin Anaphylaxis    Baclofen Rash   Codeine Hives and Nausea Only   Erythromycin Hives   Latex Hives   Lincomycin Other (See Comments)   Pentazocine Lactate Other (See Comments)    HALLUCINATION   Pneumococcal Vaccine Polyvalent Hives, Swelling and  Other (See Comments)    REACTION: swelling at injection site   Prednisone Rash    Other reaction(s): Rash, Hives - can tolerate if she takes with benadryl    Tamoxifen Nausea And Vomiting   Haemophilus Influenzae Other (See Comments)    Local reaction at the site    Haemophilus Influenzae Vaccines Other (See Comments)    Local reaction at site   Levofloxacin     Other reaction(s): sick   Other Other (See Comments)    Father had malignant hyperthermia   Doxycycline Rash   Fluzone [Influenza Virus Vaccine] Hives    Local reaction at the site    ROS See HPI    Objective:    Physical Exam Constitutional:      General: She is not in acute distress.    Appearance: Normal appearance. She is well-developed.  HENT:     Head: Normocephalic and atraumatic.     Right Ear: External ear normal.     Left Ear: External ear normal.  Eyes:     General: No scleral icterus. Neck:     Thyroid: No thyromegaly.  Cardiovascular:     Rate and Rhythm: Normal rate and regular rhythm.     Heart sounds: Normal heart sounds. No murmur heard. Pulmonary:     Effort: Pulmonary effort is normal. No respiratory distress.     Breath sounds: Normal breath sounds. No wheezing.  Musculoskeletal:     Cervical back: Neck supple.  Skin:    General: Skin is warm and dry.  Neurological:     Mental Status: She is alert and oriented to person, place, and time.     Cranial Nerves: Cranial nerves 2-12 are intact.     Motor: No weakness.     Comments: Bilateral UE/LE strength is 5/5 Speech is clear  Psychiatric:        Mood and Affect: Mood normal.        Behavior: Behavior normal.        Thought Content: Thought content normal.        Judgment: Judgment normal.      BP  139/68 (BP Location: Right Arm, Patient Position: Sitting, Cuff Size: Small)   Pulse 82   Resp 16   Ht 5\' 5"  (1.651 m)   Wt 127 lb (57.6 kg)   SpO2 100%   BMI 21.13 kg/m  Wt Readings from Last 3 Encounters:  01/11/24 127 lb (57.6 kg)  01/10/24 126 lb 12.8 oz (57.5 kg)  01/07/24 125 lb (56.7 kg)       Assessment & Plan:   Problem List Items Addressed This Visit       Unprioritized   Depression with anxiety    Patient reports a score of 4-5 on a scale of 10 for depression.  -She does counseling with Vonita Moss. -continue cymbalta 90mg  for now.  -Continue current management and monitor closely.      Altered mental status - Primary    Recent onset of confusion, auditory hallucinations, and word-finding difficulties. Had MR brain over the summer which noted the following:   1.  No evidence of an acute intracranial abnormality. 2. Mild chronic small vessel ischemic changes within the cerebral white matter, slightly progressed from the prior brain MRI of 10/10/2017. 3. Mild generalized cerebral atrophy.  Differential includes UTI, TIA/CVA, depression with psychosis, or early dementia. Patient has been feeling lost and scared while driving. -Order CT head to rule out recent stroke. -check labs as ordered -  Advised pt not to drive until further notice. -She has an upcoming appointment with neurology in April.  In the meantime, I will order a neurocognitive evaluation as it does not appear that this has been completed previously.  -Advise patient to avoid driving until further evaluation.      Relevant Orders   POCT Urinalysis Dipstick (Automated) (Completed)   Urine Culture   CT HEAD WO CONTRAST ( )   Comp Met (CMET)   TSH   B12   RPR   Ambulatory referral to Psychology   Acute cystitis without hematuria   + leuks on UA. Will send for culture. Begin Bactrim while we wait on culture results.       Relevant Medications   sulfamethoxazole-trimethoprim (BACTRIM  DS) 800-160 MG tablet   Other Relevant Orders   Comp Met (CMET)   40 minutes spent on today's visit.  Time was spent interviewing patient, coordinating care, reviewing medical record and formulating complex medical plan.   I am having [name] start on sulfamethoxazole-trimethoprim. I am also having her maintain her acetaminophen, aspirin EC, pentosan polysulfate, fluticasone, cyanocobalamin, nystatin cream, sucralfate, gabapentin, Vitamin D (Ergocalciferol), diclofenac Sodium, loratadine, Vitamin D3, OPTIVE, famotidine, atenolol, folic acid, EPINEPHrine, polyethylene glycol, fluticasone, furosemide, nitroGLYCERIN, amLODipine, pantoprazole, traMADol, hydrOXYzine, albuterol, traZODone, DULoxetine, DULoxetine, and Repatha SureClick.  Meds ordered this encounter  Medications   sulfamethoxazole-trimethoprim (BACTRIM DS) 800-160 MG tablet    Sig: Take 1 tablet by mouth 2 (two) times daily.    Dispense:  14 tablet    Refill:  0    Supervising Provider:   Danise Edge A [[id number]]

## 2024-01-11 NOTE — Assessment & Plan Note (Signed)
  Patient reports a score of 4-5 on a scale of 10 for depression.  -She does counseling with Vonita Moss. -continue cymbalta 90mg  for now.  -Continue current management and monitor closely.

## 2024-01-11 NOTE — Assessment & Plan Note (Signed)
+   leuks on UA. Will send for culture. Begin Bactrim while we wait on culture results.

## 2024-01-11 NOTE — Patient Instructions (Signed)
VISIT SUMMARY:  During today's visit, we discussed your recent onset of confusion, hearing voices, difficulty urinating, and leg swelling. We also reviewed your history of depression, back pain, and a previous urinary tract infection. We have outlined a plan to address each of these issues and will be conducting further tests to better understand your symptoms.  YOUR PLAN:  -COGNITIVE IMPAIRMENT: Cognitive impairment refers to difficulties with thinking, memory, and reasoning. We will order a CT scan of your head to check for any signs of a recent stroke and refer you to a neurologist for further memory testing. Please avoid driving until we have more information.  -URINARY TRACT INFECTION: A urinary tract infection (UTI) is an infection in any part of your urinary system. Given your symptoms and the presence of white blood cells in your urine, we will send your urine for culture and start you on an antibiotic while we wait for the results.  -DEPRESSION: Depression is a mood disorder that causes persistent feelings of sadness and loss of interest. You reported a moderate level of depression, and we will continue with your current management plan and monitor your condition closely.  -BACK PAIN: Back pain can be caused by various conditions, including muscle strain or spinal issues. Your recent MRI showed an abnormality, and you are already seeing Dr. Ethelene Hal for further evaluation. Please continue with your current management plan and follow up with Dr. Ethelene Hal.  -GENERAL HEALTH MAINTENANCE: We will order basic blood work to check your overall health. Additionally, please limit driving until we have more information about your cognitive issues.  INSTRUCTIONS:  Please follow up with the neurology department for memory testing and avoid driving until further notice. We will contact you with the results of your urine culture and blood work. Continue your current management for depression and back pain, and  follow up with Dr. Ethelene Hal as scheduled.

## 2024-01-11 NOTE — Assessment & Plan Note (Addendum)
  Recent onset of confusion, auditory hallucinations, and word-finding difficulties. Had MR brain over the summer which noted the following:   1.  No evidence of an acute intracranial abnormality. 2. Mild chronic small vessel ischemic changes within the cerebral white matter, slightly progressed from the prior brain MRI of 10/10/2017. 3. Mild generalized cerebral atrophy.  Differential includes UTI, TIA/CVA, depression with psychosis, or early dementia. Patient has been feeling lost and scared while driving. -Order CT head to rule out recent stroke. -check labs as ordered -Advised pt not to drive until further notice. -She has an upcoming appointment with neurology in April.  In the meantime, I will order a neurocognitive evaluation as it does not appear that this has been completed previously.  -Advise patient to avoid driving until further evaluation.

## 2024-01-14 ENCOUNTER — Telehealth: Payer: Self-pay | Admitting: Family

## 2024-01-14 LAB — URINE CULTURE
MICRO NUMBER:: 15969974
Result:: NO GROWTH
SPECIMEN QUALITY:: ADEQUATE

## 2024-01-14 LAB — RPR

## 2024-01-14 NOTE — Telephone Encounter (Signed)
B12 level is on the low end of normal. This can affect her memory. Has she been taking her oral b12 1000 mcg daily? If so, then we need for her to start b12 injections 1000 mcg IM weekly x 4 then monthly.    If she has not been taking b12 orally- please restart and plan to repeat b12 level in 6 weeks.   Urine culture was negative. D/c bactrim.

## 2024-01-14 NOTE — Telephone Encounter (Signed)
Patient reports she is not taking Vitamin B12, she was advised to start daily 1000 mcg. She was notified of other results and recommendations.  She will follow up as scheduled with Dr. Abner Greenspan.

## 2024-01-15 ENCOUNTER — Ambulatory Visit (HOSPITAL_BASED_OUTPATIENT_CLINIC_OR_DEPARTMENT_OTHER)
Admission: RE | Admit: 2024-01-15 | Discharge: 2024-01-15 | Disposition: A | Discharge status: Home / Self Care | Payer: HMO | Source: Ambulatory Visit | Attending: Family | Admitting: Family

## 2024-01-15 DIAGNOSIS — I6782 Cerebral ischemia: Secondary | ICD-10-CM | POA: Diagnosis not present

## 2024-01-15 DIAGNOSIS — R413 Other amnesia: Secondary | ICD-10-CM | POA: Diagnosis not present

## 2024-01-15 DIAGNOSIS — R4182 Altered mental status, unspecified: Secondary | ICD-10-CM | POA: Diagnosis not present

## 2024-01-17 ENCOUNTER — Encounter: Payer: Self-pay | Admitting: Professional

## 2024-01-17 ENCOUNTER — Telehealth: Payer: Self-pay

## 2024-01-17 ENCOUNTER — Ambulatory Visit (INDEPENDENT_AMBULATORY_CARE_PROVIDER_SITE_OTHER): Payer: HMO | Admitting: Professional

## 2024-01-17 DIAGNOSIS — F331 Major depressive disorder, recurrent, moderate: Secondary | ICD-10-CM | POA: Diagnosis not present

## 2024-01-17 DIAGNOSIS — F330 Major depressive disorder, recurrent, mild: Secondary | ICD-10-CM

## 2024-01-17 DIAGNOSIS — S93492A Sprain of other ligament of left ankle, initial encounter: Secondary | ICD-10-CM | POA: Diagnosis not present

## 2024-01-17 NOTE — Progress Notes (Signed)
[facility] Counselor/Therapist Progress Note  Patient ID: [name], MRN: [id number],    Date: 01/17/2024  Time Spent: 41 minutes [id number]   Treatment Type: Individual Therapy  Risk Assessment: Danger to Self:  No Self-injurious Behavior: No Danger to Others: No  Subjective: Patient arrived late for her in-person session due to having been sent to the wrong location.  Issues addressed: 1-physical -has had a lot of appointments -pt admits she feels scared because she doesn't know 2-daughters -she doesn't carry as much stress related to her daughters and that it is lifted -pt has texted her daughters and asked them to tell her what she has done that has impacted them and made them be so negative toward them -she has not heard back from them and expects she will not hear from them and that they will drop it 3-outer vs inner focus -how talking out loud to God has helped her -how not being so in her own thoughts that she can impact other people  Treatment Plan:   Goals Alleviate depressive symptoms Recognize, accept, and cope with depressive feelings Develop healthy thinking patterns Develop healthy interpersonal relationships (particularly younger daughter) Reduce overall frequency, intensity, and duration of anxiety Stabilize anxiety level while increasing ability to function Enhance ability to effectively cope with full variety of stressors Learn and implement coping skills that result in a reduction of anxiety    Objectives target date for all objectives is 09/26/2024 Verbalize an understanding of the cognitive, physiological, and behavioral components of anxiety       60 Learning and implement calming skills to reduce overall anxiety           60 Verbalize an understanding of the role that cognitive biases play in excessive irrational worry and persistent anxiety symptoms   70 Identify, challenge, and replace biased fearful  self-talk            60 Learn and implement problem solving strategies             70 Identify and engage in pleasant activities              80 Learning and implement personal and interpersonal skills to reduce anxiety and improve interpersonal relationships    60 Learn to accept limitations in life and commit to tolerating, rather than avoiding, unpleasant emotions while accomplishing meaningful goals 60 Identify major life conflicts from the past and present that form the basis for present anxiety       70 Maintain involvement in work, family, and social activities           70 Reestablish a consistent sleep-wake cycle             50 Verbalize an accurate understanding of depression            60 Identify and replace thoughts that support depression            50 Learn and implement behavioral strategies Verbalize an understanding and resolution of current interpersonal problems Learn and implement decision making skills Learn and implement conflict resolution skills to resolve interpersonal problems Verbalize an understanding of healthy and unhealthy emotions verbalize insight into how past relationships may be influence current experiences with depression Use mindfulness and acceptance strategies and increase value based behavior  Increase hopeful statements about the future.  Interventions Engage the patient in behavioral activation Use instruction, modeling, and role-playing to build the patient 's general social, communication, and/or conflict resolution skills Use Acceptance and  Commitment Therapy to help patient  accept uncomfortable realities in order to accomplish value-consistent goals Reinforce the patient 's insight into the role of her past emotional pain and present anxiety  Support the patient in following through with work, family, and social activities Teach and implement sleep hygiene practices  Teach the patient relaxation skills Assign the patient homework Discuss  examples demonstrating that unrealistic worry overestimates the probability of threats and underestimates patient's ability  Assist the patient in analyzing his or her worries Help patient understand that avoidance is reinforcing  Consistent with treatment model, discuss how change in cognitive, behavioral, and interpersonal can help patient alleviate depression CBT Behavioral activation to help the patient explore the relationship, nature of the dispute Help the patient develop new interpersonal skills and relationships Conduct Problem solving therapy Teach conflict resolution skills Use a process-experiential approach Conduct ACT  Diagnosis:Major depressive disorder, recurrent episode, moderate (HCC)  Major depressive disorder, recurrent episode, mild (HCC)  Plan:  -meet on Wednesday, January 30, 2024 at [facility] [employer].

## 2024-01-17 NOTE — Telephone Encounter (Signed)
Good Morning Dr. Jens Som  We have received a surgical clearance request for [name] for left ankle arthroscopy and tendon repair. They were seen recently in clinic on 01/03/2024.  She has a PMH of nonobstructive CAD by [facility] 2011, HLD, HTN.  Can you please comment on surgical clearance for upcoming scheduled left ankle tendon repair. Please forward you guidance and recommendations to [facility] PREOP   Thank you, Robin Searing, NP

## 2024-01-17 NOTE — Telephone Encounter (Signed)
   Pre-operative Risk Assessment    Patient Name: [name]  DOB: October 08, 1946 MRN: [id number]   Date of last office visit: 01/03/24 Date of next office visit: n/a   Request for Surgical Clearance    Procedure:   Left ankle arthroscopy open lateral ankle ligament stabilization,possible peroneal tendon debridement with possible repair and/or transfer, possible syndesmosis stabilization   Date of Surgery:  Clearance TBD                                 Surgeon:  Dr. Netta Cedars  Surgeon's Group or Practice Name:  [facility]  Phone number:  [phone] Fax number:  [phone]   Type of Clearance Requested:   - Medical  - Pharmacy:  Hold Aspirin Not indicated    Type of Anesthesia:  Not Indicated   Additional requests/questions:    Vance Peper   01/17/2024, 11:48 AM

## 2024-01-18 NOTE — Telephone Encounter (Signed)
   Name: TYRA GURAL  DOB: July 14, 1946  MRN: [id number]   Primary Cardiologist: Olga Millers, MD  Chart reviewed as part of pre-operative protocol coverage. Patient was contacted 01/18/2024 in reference to pre-operative risk assessment for pending surgery as outlined below.  BRIHANA QUICKEL was last seen on 12/30/2023 by Dr. Jens Som.   Per Dr. Jens Som, and based on ACC/AHA guidelines, the patient would be at acceptable risk for the planned procedure without further cardiovascular testing.   Per office protocol, she may hold Aspirin for 5-7 days prior to procedure. Please resume Aspirin as soon as possible postprocedure, at the discretion of the surgeon.   I will route this recommendation to the requesting party via Epic fax function and remove from pre-op pool. Please call with questions.  Joylene Grapes, NP 01/18/2024, 9:40 AM While

## 2024-01-21 ENCOUNTER — Encounter (HOSPITAL_COMMUNITY)
Admission: RE | Admit: 2024-01-21 | Discharge: 2024-01-21 | Disposition: A | Discharge status: Home / Self Care | Payer: HMO | Source: Ambulatory Visit | Attending: Hematology & Oncology | Admitting: Hematology & Oncology

## 2024-01-21 DIAGNOSIS — C9000 Multiple myeloma not having achieved remission: Secondary | ICD-10-CM | POA: Insufficient documentation

## 2024-01-21 LAB — GLUCOSE, CAPILLARY: Glucose-Capillary: 100 mg/dL — ABNORMAL HIGH (ref 70–99)

## 2024-01-21 MED ORDER — FLUDEOXYGLUCOSE F - 18 (FDG) INJECTION
6.3000 | Freq: Once | INTRAVENOUS | Status: AC
  Administered 2024-01-21: 6.3 via INTRAVENOUS

## 2024-01-21 NOTE — Assessment & Plan Note (Signed)
Statin intolerant

## 2024-01-21 NOTE — Assessment & Plan Note (Signed)
No recent excerbation no changes to meds

## 2024-01-21 NOTE — Assessment & Plan Note (Signed)
Monitor and report, no changes to meds. Encouraged heart healthy diet such as the DASH diet and exercise as tolerated.

## 2024-01-21 NOTE — Assessment & Plan Note (Signed)
Encouraged to get adequate exercise, calcium and vitamin d intake

## 2024-01-21 NOTE — Assessment & Plan Note (Signed)
Encouraged good sleep hygiene such as dark, quiet room. No blue/green glowing lights such as computer screens in bedroom. No alcohol or stimulants in evening. Cut down on caffeine as able. Regular exercise is helpful but not just prior to bed time. Can continue Trazodone

## 2024-01-21 NOTE — Assessment & Plan Note (Signed)
Hydrate and monitor

## 2024-01-21 NOTE — Assessment & Plan Note (Signed)
[id number] acceptable, minimize simple carbs. Increase exercise as tolerated.

## 2024-01-22 ENCOUNTER — Telehealth (INDEPENDENT_AMBULATORY_CARE_PROVIDER_SITE_OTHER): Payer: HMO | Admitting: Family Medicine

## 2024-01-22 ENCOUNTER — Encounter: Payer: Self-pay | Admitting: Family

## 2024-01-22 VITALS — BP 95/73 | HR 81 | Ht 65.0 in | Wt 125.0 lb

## 2024-01-22 DIAGNOSIS — Z1231 Encounter for screening mammogram for malignant neoplasm of breast: Secondary | ICD-10-CM

## 2024-01-22 DIAGNOSIS — M25571 Pain in right ankle and joints of right foot: Secondary | ICD-10-CM

## 2024-01-22 DIAGNOSIS — M810 Age-related osteoporosis without current pathological fracture: Secondary | ICD-10-CM

## 2024-01-22 DIAGNOSIS — F5101 Primary insomnia: Secondary | ICD-10-CM | POA: Diagnosis not present

## 2024-01-22 DIAGNOSIS — E785 Hyperlipidemia, unspecified: Secondary | ICD-10-CM

## 2024-01-22 DIAGNOSIS — R739 Hyperglycemia, unspecified: Secondary | ICD-10-CM | POA: Diagnosis not present

## 2024-01-22 DIAGNOSIS — N183 Chronic kidney disease, stage 3 unspecified: Secondary | ICD-10-CM

## 2024-01-22 DIAGNOSIS — J4489 Other specified chronic obstructive pulmonary disease: Secondary | ICD-10-CM | POA: Diagnosis not present

## 2024-01-22 DIAGNOSIS — I10 Essential (primary) hypertension: Secondary | ICD-10-CM

## 2024-01-22 DIAGNOSIS — N6310 Unspecified lump in the right breast, unspecified quadrant: Secondary | ICD-10-CM

## 2024-01-22 MED ORDER — FLUOXETINE HCL 20 MG PO TABS: 20.0000 mg | ORAL_TABLET | Freq: Every day | ORAL | 2 refills | Status: DC

## 2024-01-22 NOTE — Patient Instructions (Addendum)
Continue Duloxetine 60 mg and 30 mg daily x 7 days then drop the 30 mg dose and continue the 60 mg dose. Start Fluoxetine 20 mg tabs, 1/2 tab x 7 days then increase to 1 tab daily (20 mg)  Water 10 ounces every 1-2 hours up to 80 ounces a day  Protein every 3-4 hours

## 2024-01-23 ENCOUNTER — Encounter: Payer: Self-pay | Admitting: Family Medicine

## 2024-01-23 NOTE — Progress Notes (Addendum)
Virtual Visit via Video Note   This patient is at least at moderate risk for complications without adequate follow up. This format is felt to be most appropriate for this patient at this time. Physical exam was limited by quality of the video and audio technology used for the visit. [name], CMA was able to get the patient set up on a video visit.  Patient location: home Patient and provider in visit Provider location: Office  I discussed the limitations of evaluation and management by telemedicine and the availability of in person appointments. The patient expressed understanding and agreed to proceed. Subjective:    Patient ID: [name], female    DOB: January 30, 1946, 78 y.o.   MRN: [id number]  Chief Complaint  Patient presents with   Follow-up    6 week    HPI Discussed the use of AI scribe software for clinical note transcription with the patient, who gave verbal consent to proceed.  History of Present Illness   The patient, with a history of cancer and planned ankle surgery, presents with multiple concerns. The patient reports persistent headaches, fatigue, and nausea, which have been ongoing for an unspecified period. The patient also experiences alternating bouts of diarrhea and constipation. The patient has been experiencing difficulty walking and imbalance, leading to falls. The patient also reports itching in the head and ears, and new skin lesions have appeared. The patient has a history of cancer in the right breast and is currently experiencing some swelling and discomfort in the same area. The patient is also dealing with stress, anxiety, and depression. The patient's symptoms have been causing significant distress and impacting their quality of life.        Past Medical History:  Diagnosis Date   Abdominal pain 10/26/2013   Allergic rhinitis 01/25/2016   ANA positive 07/29/2013   Anemia    Arthralgia 08/18/2013   Arthritis    Asthma, allergic 08/05/2012    Atrial flutter    past history- not current   Atypical chest pain 01/01/2015   Bilateral carpal tunnel syndrome 03/04/2015   CAD in native artery 08/25/2010   Minor CAD 2009, 2011, low risk Myoview 2013 and 2016        Cataract    bilaterally removed    Cellulitis of breast 02/05/2023   Chronic constipation    Chronic cough 01/09/2017   Chronic interstitial cystitis 02/01/2017   Chronic night sweats 01/08/2015   Common bile duct dilatation 02/13/2023   Concussion    x 3   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   DDD (degenerative disc disease), cervical 09/17/2018   Degenerative scoliosis 04/22/2018   Deviated septum 05/12/2016   Dysphagia    Dyspnea    Dysuria 03/09/2021   Elevated alkaline phosphatase level 01/25/2023   Essential hypertension 08/25/2010   External hemorrhoids 02/05/2015   Facial trauma 01/22/2023   Family history of adverse reaction to anesthesia    Family history of malignant hyperthermia    father had this   Fibrocystic breast disease 10/18/2010   Fibromyalgia    Frequency of urination    Generalized anxiety disorder 08/03/2010   GERD (gastroesophageal reflux disease)    Headache 10/13/2013   High serum vitamin D 05/18/2021   History of chronic bronchitis    History of colonic polyp 02/25/2019   History of hiatal hernia    History of tachycardia    CONTROLLED  WITH ATENOLOL   Hyperglycemia 03/08/2021   Hyperkalemia 09/28/2022  Hyperlipidemia    Intertrigo 12/13/2022   Kidney stone 02/26/2019   Knee pain, right 04/30/2015   Left hip pain 04/30/2015   Leg pain 02/24/2011   Qualifier: Diagnosis of   By: Laury Axon DOMyrene Buddy         Leukocytosis 07/05/2022   Local reaction to hymenoptera sting 05/09/2023   Low back pain 09/08/2020   Major depressive disorder 02/05/2013   Malignant neoplasm of lower-outer quadrant of right breast of female, estrogen receptor positive 01/03/2013   Nasal turbinate hypertrophy 05/12/2016   Oral herpes 02/05/2023   OSA  and COPD overlap syndrome 06/10/2020   Osteoporosis 01/2019   T score -2.2 stable/improved from prior study   Pelvic pain    Peripheral neuropathy 05/22/2014   Personal history of radiation therapy    Pneumonia    Primary insomnia 06/25/2017   Pulmonary nodules 02/12/2015   Rash and other nonspecific skin eruption 05/09/2023   Recurrent falls 05/02/2020   Recurrent sinusitis 02/25/2019   Right arm pain 07/20/2016   RLS (restless legs syndrome) 11/09/2019   RUQ pain 07/05/2022   S/P radiation therapy 11/12/12 - 12/05/12   right Breast   Sciatica 04/30/2015   Sepsis 2014   from UTI    Sjogren's disease 12/12/2021   Splenic lesion 09/02/2012   Sprain of lateral ligament of ankle joint 07/31/2023   Stage 3 chronic kidney disease    Statin intolerance 02/29/2020   Stricture and stenosis of esophagus 10/19/2011   Tongue lesion 02/05/2023   Tremor 07/05/2022   Urgency of urination    Vertigo 01/22/2023   Vitamin B12 deficiency 03/08/2021   Vocal cord cyst 05/18/2021    Past Surgical History:  Procedure Laterality Date   24 HOUR PH STUDY N/A 04/03/2016   Procedure: 24 HOUR PH STUDY;  Surgeon: Ruffin Frederick, MD;  Location: [facility];  Service: Gastroenterology;  Laterality: N/A;   BREAST BIOPSY Right 08/23/2012   ADH   BREAST BIOPSY Right 10/11/2012   Ductal Carcinoma   BREAST EXCISIONAL BIOPSY Right 09/04/2013   benign   BREAST LUMPECTOMY Right 10/11/2012   W/ [facility]N BX   CARDIAC CATHETERIZATION  09-13-2007  DR Riley Kill   WELL-PRESERVED LVF/  DIFFUSE SCATTERED CORONARY CALCIFACATION AND ATHEROSCLEROSIS WITHOUT OBSTRUCTION   CARDIAC CATHETERIZATION  08-04-2010  DR MCALHANY   NON-OBSTRUCTIVE CAD/  pLAD 40%/  oLAD 30%/  mLAD 30%/  pRCA 30%/  EF 60%   CARDIOVASCULAR STRESS TEST  06-18-2012  DR McALHANY   LOW RISK NUCLEAR STUDY/  SMALL FIXED AREA OF MODERATELY DECREASED UPTAKE IN ANTEROSEPTAL WALL WHICH MAY BE ARTIFACTUAL/  NO ISCHEMIA/  EF 68%   COLONOSCOPY  09/29/2010    CYSTOSCOPY     CYSTOSCOPY WITH HYDRODISTENSION AND BIOPSY N/A 03/06/2014   Procedure: CYSTOSCOPY/HYDRODISTENSION/ INSTILATION OF MARCAINE AND PYRIDIUM;  Surgeon: Kathi Ludwig, MD;  Location: [facility];  Service: Urology;  Laterality: N/A;   CYSTOSCOPY/URETEROSCOPY/HOLMIUM LASER/STENT PLACEMENT Left 03/21/2023   Procedure: CYSTOSCOPY LEFT RETROGRADE PYELOGRAM, LEFT URETEROSCOPY, HOLMIUM LASER LITHOTRIPSYM, AND LEFT URETERAL STENT PLACEMENT;  Surgeon: Rene Paci, MD;  Location: [facility];  Service: Urology;  Laterality: Left;  60 MINUTES   ESOPHAGEAL MANOMETRY N/A 04/03/2016   Procedure: ESOPHAGEAL MANOMETRY (EM);  Surgeon: Ruffin Frederick, MD;  Location: [facility];  Service: Gastroenterology;  Laterality: N/A;   EXTRACORPOREAL SHOCK WAVE LITHOTRIPSY Left 02/06/2019   Procedure: EXTRACORPOREAL SHOCK WAVE LITHOTRIPSY (ESWL);  Surgeon: Ihor Gully, MD;  Location: [facility];  Service: Urology;  Laterality:  Left;   NASAL SINUS SURGERY  1985   ORIF RIGHT ANKLE  FX  2006   POLYPECTOMY     REMOVAL VOCAL CORD CYST  01/2013   RIGHT BREAST BX  08/23/2012   RIGHT HAND SURGERY  X3  LAST ONE 2009   INCLUDES  ORIF RIGHT 5TH FINGER AND REVISION TWICE   SKIN CANCER EXCISION     face,arms   TONSILLECTOMY AND ADENOIDECTOMY  AGE 15   TOTAL ABDOMINAL HYSTERECTOMY W/ BILATERAL SALPINGOOPHORECTOMY  1982   W/  APPENDECTOMY   TRANSTHORACIC ECHOCARDIOGRAM  06/24/2012   GRADE I DIASTOLIC DYSFUNCTION/  EF 55-60%/  MILD MR   UPPER GASTROINTESTINAL ENDOSCOPY      Family History  Problem Relation Age of Onset   Rectal cancer Mother    Colon cancer Mother    Pancreatic cancer Mother    Diabetes Mother    Breast cancer Mother 68   Breast cancer Maternal Aunt        breast   Birth defects Maternal Aunt    Irritable bowel syndrome Son    Heart disease Son        CAD, MV replacement   Allergic Disorder Daughter    Diabetes Daughter    Colon cancer Father    Colon  polyps Father    Diabetes Father    Stroke Father    Heart disease Father        CHF   Hyperlipidemia Father    Hypertension Father    Arthritis Father    Breast cancer Paternal Aunt    Breast cancer Paternal Aunt    Arthritis Paternal Uncle    Allergic Disorder Daughter    Heart disease Cousin        CAD, had a blood clot after stenting   Esophageal cancer Neg Hx    Stomach cancer Neg Hx     Social History   Socioeconomic History   Marital status: Married    Spouse name: Windy Fast    Number of children: 3   Years of education: 16   Highest education level: Bachelor's degree (e.g., BA, AB, BS)  Occupational History   Occupation: Retired    Comment: Charity fundraiser  Tobacco Use   Smoking status: Former    Current packs/day: 0.00    Average packs/day: 1 pack/day for 15.0 years (15.0 ttl pk-yrs)    Types: Cigarettes    Start date: 05/27/1990    Quit date: 05/27/2005    Years since quitting: 18.6    Passive exposure: Never   Smokeless tobacco: Never  Vaping Use   Vaping status: Never Used  Substance and Sexual Activity   Alcohol use: No    Alcohol/week: 0.0 standard drinks of alcohol   Drug use: No   Sexual activity: Not Currently    Partners: Male    Birth control/protection: Surgical    Comment: 1st intercourse 78 yo-Fewer than 5 partners, hysterectomy  Other Topics Concern   Not on file  Social History Narrative   Lives with husband.   Caffeine use: 1/2 cup per day   Exercise-- 2days a week [employer],  Water aerobics, walking       Retired Engineer, civil (consulting)   No dietary restrictions, tries to maintain a heart healthy diet   Very pleasant lady   Social Drivers of Corporate investment banker Strain: Low Risk  (12/07/2023)   Overall Financial Resource Strain (CARDIA)    Difficulty of Paying Living Expenses: Not hard at all  Food Insecurity:  No Food Insecurity (12/07/2023)   Hunger Vital Sign    Worried About Running Out of Food in the Last Year: Never true    Ran Out of Food in the Last  Year: Never true  Transportation Needs: No Transportation Needs (12/07/2023)   PRAPARE - Administrator, Civil Service (Medical): No    Lack of Transportation (Non-Medical): No  Physical Activity: Inactive (12/07/2023)   Exercise Vital Sign    Days of Exercise per Week: 0 days    Minutes of Exercise per Session: 20 min  Stress: No Stress Concern Present (12/07/2023)   [employer] of Occupational Health - Occupational Stress Questionnaire    Feeling of Stress : Only a little  Recent Concern: Stress - Stress Concern Present (10/20/2023)   [employer] of Occupational Health - Occupational Stress Questionnaire    Feeling of Stress : To some extent  Social Connections: Socially Integrated (12/07/2023)   Social Connection and Isolation Panel [NHANES]    Frequency of Communication with Friends and Family: More than three times a week    Frequency of Social Gatherings with Friends and Family: Twice a week    Attends Religious Services: More than 4 times per year    Active Member of [employer] or Organizations: Yes    Attends Engineer, structural: More than 4 times per year    Marital Status: Married  Catering manager Violence: Not At Risk (09/12/2023)   Humiliation, Afraid, Rape, and Kick questionnaire    Fear of Current or Ex-Partner: No    Emotionally Abused: No    Physically Abused: No    Sexually Abused: No    Outpatient Medications Prior to Visit  Medication Sig Dispense Refill   acetaminophen (TYLENOL) 500 MG tablet Take 1,000 mg by mouth every 6 (six) hours as needed for mild pain or headache.      albuterol (VENTOLIN HFA) 108 (90 Base) MCG/ACT inhaler Inhale 2 puffs into the lungs every 4 (four) hours as needed for wheezing or shortness of breath (coughing fits). 18 g 1   amLODipine (NORVASC) 5 MG tablet Take 5 mg by mouth daily.     aspirin EC 81 MG tablet Take 1 tablet (81 mg total) by mouth daily. 90 tablet 3   atenolol (TENORMIN) 25 MG tablet Take 1  tablet (25 mg total) by mouth 2 (two) times daily. Take 1 tablet by mouth 2 time a day 180 tablet 3   carboxymethylcellul-glycerin (OPTIVE) 0.5-0.9 % ophthalmic solution Place 1 drop into both eyes daily as needed for dry eyes.     Cholecalciferol (VITAMIN D3) 50 MCG (2000 UT) CAPS Take 2,000 Units by mouth daily.     cyanocobalamin (VITAMIN B12) 1000 MCG tablet Take 1,000 mg by mouth daily with breakfast.     diclofenac Sodium (VOLTAREN) 1 % GEL Apply 2 g topically daily as needed (Back pain).     DULoxetine (CYMBALTA) 30 MG capsule Take 1 capsule (30 mg total) by mouth daily at 12 noon. 30 capsule 3   DULoxetine (CYMBALTA) 60 MG capsule Take 1 capsule (60 mg total) by mouth every morning. 90 capsule 1   EPINEPHrine 0.3 mg/0.3 mL IJ SOAJ injection Inject 0.3 mg into the muscle as needed for anaphylaxis. 2 each 1   famotidine (PEPCID) 20 MG tablet Take 1 tablet (20 mg total) by mouth 2 (two) times daily as needed for heartburn or indigestion. 60 tablet 1   fluticasone (FLONASE) 50 MCG/ACT nasal spray Use 2  spray(s) in each nostril once daily (Patient taking differently: Place 2 sprays into both nostrils daily as needed for allergies or rhinitis.) 16 g 5   fluticasone (FLOVENT HFA) 110 MCG/ACT inhaler Inhale 2 puffs into the lungs 2 (two) times daily. 1 each 5   folic acid (FOLVITE) 1 MG tablet Take 1 tablet (1 mg total) by mouth daily.     furosemide (LASIX) 20 MG tablet TAKE 1 TABLET BY MOUTH AS NEEDED FOR EDEMA 90 tablet 3   gabapentin (NEURONTIN) 300 MG capsule Take 300-900 mg by mouth See admin instructions. Take 600 mg in the morning 300 mg at lunch time and 900 mg at bedtime     hydrOXYzine (ATARAX) 10 MG tablet Take 0.5-2 tablets (5-20 mg total) by mouth 3 (three) times daily as needed for anxiety (not with Loratadine). 90 tablet 2   loratadine (CLARITIN) 10 MG tablet Take 10 mg by mouth daily as needed for rhinitis.     nitroGLYCERIN (NITROSTAT) 0.4 MG SL tablet Place 1 tablet (0.4 mg total)  under the tongue every 5 (five) minutes x 3 doses as needed for chest pain. 25 tablet 2   nystatin cream (MYCOSTATIN) Apply 1 application topically 2 (two) times daily. (Patient taking differently: Apply 1 application  topically daily as needed (vaginal yeast).) 30 g 2   pantoprazole (PROTONIX) 40 MG tablet Take 40 mg by mouth 2 (two) times daily.     pentosan polysulfate (ELMIRON) 100 MG capsule Take 1 capsule (100 mg total) by mouth 3 (three) times daily. Reported on 01/05/2016 (Patient taking differently: Take 100 mg by mouth daily as needed (burning and pain). Reported on 01/05/2016) 90 capsule 11   polyethylene glycol (MIRALAX) 17 g packet Take 17 g by mouth 2 (two) times daily. 14 each 0   REPATHA SURECLICK 140 MG/ML SOAJ INJECT 140 MG INTO THE SKIN EVERY 14 DAYS 6 mL 0   sucralfate (CARAFATE) 1 GM/10ML suspension Take 10 mLs (1 g total) by mouth 4 (four) times daily -  with meals and at bedtime. 420 mL 1   traMADol (ULTRAM) 50 MG tablet Take 1 tablet by mouth 3 (three) times daily as needed.     traZODone (DESYREL) 150 MG tablet Take 1 tablet (150 mg total) by mouth at bedtime. 30 tablet 3   Vitamin D, Ergocalciferol, (DRISDOL) 1.25 MG (50000 UNIT) CAPS capsule Take 1 capsule (50,000 Units total) by mouth every 7 (seven) days. 12x Weeks 5 capsule 3   No facility-administered medications prior to visit.    Allergies  Allergen Reactions   Clindamycin Hcl Shortness Of Breath and Rash   Penicillins Anaphylaxis   Rosuvastatin Anaphylaxis   Baclofen Rash   Codeine Hives and Nausea Only   Erythromycin Hives   Latex Hives   Lincomycin Other (See Comments)   Pentazocine Lactate Other (See Comments)    HALLUCINATION   Pneumococcal Vaccine Polyvalent Hives, Swelling and Other (See Comments)    REACTION: swelling at injection site   Prednisone Rash    Other reaction(s): Rash, Hives - can tolerate if she takes with benadryl    Tamoxifen Nausea And Vomiting   Haemophilus Influenzae Other (See  Comments)    Local reaction at the site    Haemophilus Influenzae Vaccines Other (See Comments)    Local reaction at site   Levofloxacin     Other reaction(s): sick   Other Other (See Comments)    Father had malignant hyperthermia   Doxycycline Rash   Fluzone [  Influenza Virus Vaccine] Hives    Local reaction at the site    Review of Systems  Constitutional:  Positive for malaise/fatigue. Negative for fever.  HENT:  Negative for congestion.   Eyes:  Negative for blurred vision.  Respiratory:  Negative for shortness of breath.   Cardiovascular:  Negative for chest pain, palpitations and leg swelling.  Gastrointestinal:  Negative for abdominal pain, blood in stool and nausea.  Genitourinary:  Negative for dysuria and frequency.  Musculoskeletal:  Positive for back pain, falls, joint pain, myalgias and neck pain.  Skin:  Negative for rash.  Neurological:  Negative for dizziness, loss of consciousness and headaches.  Endo/Heme/Allergies:  Negative for environmental allergies.  Psychiatric/Behavioral:  Positive for depression. The patient is nervous/anxious and has insomnia.        Objective:    Physical Exam Constitutional:      General: She is not in acute distress.    Appearance: Normal appearance. She is not ill-appearing or toxic-appearing.  HENT:     Head: Normocephalic and atraumatic.     Right Ear: External ear normal.     Left Ear: External ear normal.     Nose: Nose normal.  Eyes:     General:        Right eye: No discharge.        Left eye: No discharge.  Pulmonary:     Effort: Pulmonary effort is normal.  Skin:    Findings: No rash.  Neurological:     Mental Status: She is alert and oriented to person, place, and time.  Psychiatric:        Behavior: Behavior normal.     BP 95/73 Comment: Patient stated  Pulse 81 Comment: Patient stated  Ht 5\' 5"  (1.651 m) Comment: Patient stated  Wt 125 lb (56.7 kg) Comment: Patient stated  SpO2 98% Comment: Patient  stated  BMI 20.80 kg/m  Wt Readings from Last 3 Encounters:  01/22/24 125 lb (56.7 kg)  01/11/24 127 lb (57.6 kg)  01/10/24 126 lb 12.8 oz (57.5 kg)    Diabetic Foot Exam - Simple   No data filed    Lab Results  Component Value Date   WBC 9.2 01/07/2024   HGB 13.8 01/07/2024   HCT 43.5 01/07/2024   PLT 275 01/07/2024   GLUCOSE 97 01/11/2024   CHOL 131 08/28/2023   TRIG 122.0 08/28/2023   HDL 39.40 08/28/2023   LDLDIRECT 74.0 06/12/2023   LDLCALC 67 08/28/2023   ALT 7 01/11/2024   AST 15 01/11/2024   NA 139 01/11/2024   K 4.2 01/11/2024   CL 101 01/11/2024   CREATININE 0.91 01/11/2024   BUN 15 01/11/2024   CO2 32 01/11/2024   TSH 1.70 01/11/2024   INR 1.05 08/03/2010   HGBA1C 5.5 05/28/2023   MICROALBUR <0.7 08/08/2023    Lab Results  Component Value Date   TSH 1.70 01/11/2024   Lab Results  Component Value Date   WBC 9.2 01/07/2024   HGB 13.8 01/07/2024   HCT 43.5 01/07/2024   MCV 94.4 01/07/2024   PLT 275 01/07/2024   Lab Results  Component Value Date   NA 139 01/11/2024   K 4.2 01/11/2024   CHLORIDE 102 04/20/2015   CO2 32 01/11/2024   GLUCOSE 97 01/11/2024   BUN 15 01/11/2024   CREATININE 0.91 01/11/2024   BILITOT 0.4 01/11/2024   ALKPHOS 113 01/11/2024   AST 15 01/11/2024   ALT 7 01/11/2024   PROT 6.0  01/11/2024   ALBUMIN 3.9 01/11/2024   CALCIUM 8.8 01/11/2024   ANIONGAP 7 01/07/2024   EGFR 50.0 08/15/2023   GFR 60.42 01/11/2024   Lab Results  Component Value Date   CHOL 131 08/28/2023   Lab Results  Component Value Date   HDL 39.40 08/28/2023   Lab Results  Component Value Date   LDLCALC 67 08/28/2023   Lab Results  Component Value Date   TRIG 122.0 08/28/2023   Lab Results  Component Value Date   CHOLHDL 3 08/28/2023   Lab Results  Component Value Date   HGBA1C 5.5 05/28/2023       Assessment & Plan:  COPD with asthma (HCC) Assessment & Plan: No recent excerbation no changes to meds   Essential  hypertension Assessment & Plan: Monitor and report, no changes to meds. Encouraged heart healthy diet such as the DASH diet and exercise as tolerated.     Hyperglycemia Assessment & Plan: hgba1c acceptable, minimize simple carbs. Increase exercise as tolerated.    Hyperlipidemia, unspecified hyperlipidemia type Assessment & Plan: Statin intolerant   Osteoporosis, unspecified osteoporosis type, unspecified pathological fracture presence Assessment & Plan: Encouraged to get adequate exercise, calcium and vitamin d intake    Primary insomnia Assessment & Plan: Encouraged good sleep hygiene such as dark, quiet room. No blue/green glowing lights such as computer screens in bedroom. No alcohol or stimulants in evening. Cut down on caffeine as able. Regular exercise is helpful but not just prior to bed time. Can continue Trazodone   Stage 3 chronic kidney disease, unspecified whether stage 3a or 3b CKD (HCC) Assessment & Plan: Hydrate and monitor    Mass of right breast, unspecified quadrant -     [place] LIMITED ULTRASOUND INCLUDING AXILLA RIGHT BREAST; Future  Encounter for screening mammogram for malignant neoplasm of breast -     [place] LIMITED ULTRASOUND INCLUDING AXILLA RIGHT BREAST; Future -     MM 3D DIAGNOSTIC MAMMOGRAM BILATERAL BREAST; Future  Right ankle pain, unspecified chronicity Assessment & Plan: Patient is struggling with mobility complicated by her right ankle pain and instability. She has agreed to proceed with surgical intervention to stabilize her ankle and improve her mobility. She is medically stable to proceed with surgery and wishes to proceed.    Other orders -     FLUoxetine HCl; Take 1 tablet (20 mg total) by mouth daily.  Dispense: 30 tablet; Refill: 2    Assessment and Plan    Chronic Headache Persistent despite rest and medication. No acute intracranial abnormalities on CT scan. Mild chronic small vessel ischemic disease and cerebral atrophy noted,  consistent with age. No clear etiology for auditory hallucinations. -Refer to Neurology for further evaluation and management. reminded to hydrate consistently 10 ounces of water every 1-2 hours and needs to eat protein every 3-4 hours, get adequate sleep and exercise  Anxiety and Depression Chronic, with recent exacerbation. Currently on Duloxetine 60mg  and 30mg . -Add Fluoxetine 10mg  daily for 7 days, then increase to 20mg  daily. -Reduce Duloxetine to 60mg  daily after 7 days. -Reevaluate in late January.  Right Breast Pain History of breast cancer in the same breast. Pain has been progressively worsening. -Order diagnostic mammogram and ultrasound of the right breast.  Skin Lesions New, itchy lesions on the skin. History of cherry angioma. -Refer to Dermatology for evaluation and possible biopsy.  Preoperative Clearance for Ankle Surgery Surgery pending PET scan results. -Complete preoperative clearance form.  General Health Maintenance -Encourage regular protein intake every  3-4 hours and 10 ounces of water every 1-2 hours to maintain blood volume, hydration status, and blood sugar levels. -Follow-up in late January.         Danise Edge, MD

## 2024-01-23 NOTE — Assessment & Plan Note (Signed)
Patient is struggling with mobility complicated by her right ankle pain and instability. She has agreed to proceed with surgical intervention to stabilize her ankle and improve her mobility. She is medically stable to proceed with surgery and wishes to proceed.

## 2024-01-24 ENCOUNTER — Ambulatory Visit: Payer: HMO | Admitting: Nurse Practitioner

## 2024-01-24 ENCOUNTER — Encounter: Payer: Self-pay | Admitting: Nurse Practitioner

## 2024-01-24 ENCOUNTER — Ambulatory Visit
Admission: RE | Admit: 2024-01-24 | Discharge: 2024-01-24 | Disposition: A | Discharge status: Home / Self Care | Payer: HMO | Source: Ambulatory Visit | Attending: Nurse Practitioner | Admitting: Nurse Practitioner

## 2024-01-24 VITALS — BP 112/86 | HR 68 | Ht 65.0 in | Wt 126.0 lb

## 2024-01-24 DIAGNOSIS — R10814 Left lower quadrant abdominal tenderness: Secondary | ICD-10-CM | POA: Diagnosis not present

## 2024-01-24 DIAGNOSIS — K219 Gastro-esophageal reflux disease without esophagitis: Secondary | ICD-10-CM | POA: Diagnosis not present

## 2024-01-24 DIAGNOSIS — K5909 Other constipation: Secondary | ICD-10-CM | POA: Diagnosis not present

## 2024-01-24 DIAGNOSIS — R194 Change in bowel habit: Secondary | ICD-10-CM

## 2024-01-24 DIAGNOSIS — R197 Diarrhea, unspecified: Secondary | ICD-10-CM

## 2024-01-24 DIAGNOSIS — K5900 Constipation, unspecified: Secondary | ICD-10-CM | POA: Diagnosis not present

## 2024-01-24 DIAGNOSIS — D509 Iron deficiency anemia, unspecified: Secondary | ICD-10-CM | POA: Diagnosis not present

## 2024-01-24 DIAGNOSIS — R1032 Left lower quadrant pain: Secondary | ICD-10-CM

## 2024-01-24 DIAGNOSIS — R1030 Lower abdominal pain, unspecified: Secondary | ICD-10-CM | POA: Diagnosis not present

## 2024-01-24 NOTE — Patient Instructions (Addendum)
Your provider has requested that you have an abdominal x ray before leaving today. Please go to the basement floor to our Radiology department for the test.  Your provider has requested that you go to the basement level for lab work before leaving today. Press "B" on the elevator. The lab is located at the first door on the left as you exit the elevator.  _______________________________________________________  If your blood pressure at your visit was 140/90 or greater, please contact your primary care physician to follow up on this.  _______________________________________________________  If you are age 79 or older, your body mass index should be between 23-30. Your Body mass index is 20.97 kg/m. If this is out of the aforementioned range listed, please consider follow up with your Primary Care Provider.  If you are age 86 or younger, your body mass index should be between 19-25. Your Body mass index is 20.97 kg/m. If this is out of the aformentioned range listed, please consider follow up with your Primary Care Provider.   ________________________________________________________  The [place] [facility] providers would like to encourage you to use [facility] to communicate with providers for non-urgent requests or questions.  Due to long hold times on the telephone, sending your provider a message by [facility] may be a faster and more efficient way to get a response.  Please allow 48 business hours for a response.  Please remember that this is for non-urgent requests.  _______________________________________________________

## 2024-01-24 NOTE — Progress Notes (Signed)
ASSESSMENT    Brief Narrative:  78 y.o.  female known to Dr. Adela Lank with a past medical history not limited to constipation / abdominal pain, COPD, CAD, a flutter, right breast cancer, interstitial cystitis, renal stones, osteoporosis, iron deficiency , CKD 3, headaches, depression   Chronic constipation here with bowel changes.  Having multiple bowel movements a day with stool consistency ranging between very soft and liquid and LLQ tenderness on exam. Infectious etiology. Overflow diarrhea?  Chronic GERD Manages with twice daily pantoprazole.  She will take a famotidine or Carafate sometimes if needed or if she anticipates eating something that will cause heartburn  Iron deficiency anemia followed by Hematology Hemoglobin 13.8  See PMH for any additional medical & surgical history   PLAN   --Continue BID Protonix and Carafate or Pepcid as needed.  --KUB to rule out overflow diarrhea --Check for C-diff  --Will call her with results and further recommendations. Follow scheduled for April   HPI   Chief complaint : follow-up on constipation  Brief GI history  Patient was last in the office 05/22/2023 (Dr. Adela Lank), see that note for details.  In summary she was seen for abdominal pain, bloating and constipation despite MiraLAX.  Pelvic floor PT had been helpful but she stopped going during COVID.  We previously also thought that Bentyl and IBgard were helpful for her symptoms.  She had had a CT scan in March 2024 for evaluation of some of her GI symptoms and there was no concerning pathology.  It was felt that her abdominal pain and bloating was most likely related to worsening of her chronic constipation.  Dr. Adela Lank increased her MiraLAX to twice daily.  He gave her samples of Linzess to take as needed.  He referred her back to pelvic floor PT. he asked that she stop the dicyclomine due to worsening constipation.  She was advised to continue IBgard.   Bonita Quin did not  make her scheduled follow-up with Dr. Adela Lank as she got lost on the way to the office.  She was rescheduled to see me today  Interval HIstory  IBGuard caused abdominal pain so stopped it.  No longer having constipation.  For the last few weeks she has been having stools ranging from very soft and unformed to liquid.  Having 6-7 bowel movements a day.  She has not seen any blood in her stools .She complains of tenderness of her LLQ. She does not recall having any recent antibiotics.  She admits to some confusion lately and apologizing that she is unable to give a detailed history.  She cannot relate the bowel changes to any medication or diet changes.  She is taking her husband's antidiarrheal medication which slows the diarrhea, she thinks it is imodium    Ajayla mentions that her orthopedic surgeon ordered an MRI for back pain.  The MRI showed a spine abnormality. She saw her Oncologist and a bone marrow was done but the path report was relatively unremarkable.  Dr. Myna Hidalgo sent her for a PET scan which was done on 01/21/2024 and the results are pending.  She is repeatedly mentioning something about the possibility of melanoma ??.  Again she tells me that she has not felt well and may be confused about some details.  Head CT scan in January did not show any acute abnormalities  GI History / Pertinent GI Studies   **All endoscopic studies may not be included here    EGD 12/24/2018:  - Esophagogastric  landmarks identified. - Normal esophagus - empiric dilation performed to 17mm with small mucosal wrent at the UES - Erythematous mucosa in the gastric body. - Normal duodenal bulb and second portion of the duodenum. - Biopsies were taken with a cold forceps for Helicobacter pylori testing.   Colonoscopy 12/24/2018: - One 4 mm polyp in the ascending colon, removed with a cold snare. Resected and retrieved. - One 4 mm polyp in the transverse colon, removed with a cold snare. Resected  and retrieved. - One 4 mm polyp in the descending colon, removed with a cold snare. Resected and retrieved. - One 3 mm polyp in the sigmoid colon, removed with a cold snare. Resected and retrieved. - Anal papilla(e) were hypertrophied. Biopsied. - Tortuous colon, which prohibited ileal intubation. - Internal hemorrhoids. - Mostly adequate prep, fair in the cecum. - The examination was otherwise normal. No evidence of IBD or fistula. Suspect rectal bleeding is due to hemorrhoids in the setting of constipation. -3-year colonoscopy recall to be considered as the patient would be 75 at that point   1. Surgical [P], gastric antrum and gastric body - MILD REACTIVE GASTROPATHY. - NEGATIVE FOR HELICOBACTER PYLORI. - NO INTESTINAL METAPLASIA, DYSPLASIA, OR MALIGNANCY. 2. Surgical [P], transverse, ascending, sigmoid, polyp (2) - TUBULAR ADENOMA. - SESSILE SERRATED POLYP WITHOUT DYSPLASIA (X2 FRAGMENTS). - NO HIGH GRADE DYSPLASIA OR MALIGNANCY. 3. Surgical [P], anal papilla, polyp - POLYPOID FRAGMENT OF BENIGN SQUAMOUS MUCOSA. - NO DYSPLASIA OR MALIGNANCY.     EGD 03/22/22: - Esophagogastric landmarks were identified: the Z-line was found at 39 cm, the gastroesophageal junction was found at 39 cm and the upper extent of the gastric folds was found at 40 cm from the incisors. Findings: - A 1 cm hiatal hernia was present. - The exam of the esophagus was otherwise normal. - A guidewire was placed and the scope was withdrawn. Empiric dilation was performed in the entire esophagus with a Savary dilator with mild resistance at 15 mm and then 17 mm (initially passage of 17mm felt tight, so started with 15mm first). Relook endoscopy showed appropriate small mucosal wrent just inferior to the UES. - The entire examined stomach was normal. - The duodenal bulb and second portion of the duodenum were normal.   Colonoscopy 03/02/22: - The perianal and digital rectal examinations were normal. - A large amount of  semi-liquid stool was found in the ascending colon and in the cecum, making visualization difficult initially. Lavage of the area was performed using copious amounts of sterile water, resulting in clearance with adequate visualization. - Anal papilla(e) were hypertrophied. - Internal hemorrhoids were found during retroflexion. The hemorrhoids were small. - The colon was tortuous with looping. Cecal intubation was technically challenging. - The exam was otherwise without abnormality. No overt evidence of fistula in the colon. No inflammatory changes - Biopsies for histology were taken with a cold forceps from the right colon, left colon and transverse colon for evaluation of microscopic colitis.      Latest Ref Rng & Units 01/11/2024    9:34 AM 01/07/2024    8:45 AM 12/10/2023    1:57 PM  Hepatic Function  Total Protein 6.0 - 8.3 g/dL 6.0  6.7  6.5   Albumin 3.5 - 5.2 g/dL 3.9  4.1  3.9   AST 0 - 37 U/L 15  15  15    ALT 0 - 35 U/L 7  6  8    Alk Phosphatase 39 - 117 U/L 113  105  96   Total Bilirubin 0.2 - 1.2 mg/dL 0.4  0.5  0.5        Latest Ref Rng & Units 01/07/2024    8:45 AM 12/13/2023    9:22 AM 12/10/2023    1:57 PM  CBC  WBC 4.0 - 10.5 K/uL 9.2  7.2  8.0   Hemoglobin 12.0 - 15.0 g/dL 40.9  81.1  91.4   Hematocrit 36.0 - 46.0 % 43.5  40.6  42.4   Platelets 150 - 400 K/uL 275  235  263     Past Medical History:  Diagnosis Date   Abdominal pain 10/26/2013   Allergic rhinitis 01/25/2016   ANA positive 07/29/2013   Anemia    Arthralgia 08/18/2013   Arthritis    Asthma, allergic 08/05/2012   Atrial flutter    past history- not current   Atypical chest pain 01/01/2015   Bilateral carpal tunnel syndrome 03/04/2015   CAD in native artery 08/25/2010   Minor CAD 2009, 2011, low risk Myoview 2013 and 2016        Cataract    bilaterally removed    Cellulitis of breast 02/05/2023   Chronic constipation    Chronic cough 01/09/2017   Chronic interstitial cystitis 02/01/2017    Chronic night sweats 01/08/2015   Common bile duct dilatation 02/13/2023   Concussion    x 3   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   DDD (degenerative disc disease), cervical 09/17/2018   Degenerative scoliosis 04/22/2018   Deviated septum 05/12/2016   Dysphagia    Dyspnea    Dysuria 03/09/2021   Elevated alkaline phosphatase level 01/25/2023   Essential hypertension 08/25/2010   External hemorrhoids 02/05/2015   Facial trauma 01/22/2023   Family history of adverse reaction to anesthesia    Family history of malignant hyperthermia    father had this   Fibrocystic breast disease 10/18/2010   Fibromyalgia    Frequency of urination    Generalized anxiety disorder 08/03/2010   GERD (gastroesophageal reflux disease)    Headache 10/13/2013   High serum vitamin D 05/18/2021   History of chronic bronchitis    History of colonic polyp 02/25/2019   History of hiatal hernia    History of tachycardia    CONTROLLED  WITH ATENOLOL   Hyperglycemia 03/08/2021   Hyperkalemia 09/28/2022   Hyperlipidemia    Intertrigo 12/13/2022   Kidney stone 02/26/2019   Knee pain, right 04/30/2015   Left hip pain 04/30/2015   Leg pain 02/24/2011   Qualifier: Diagnosis of   By: Laury Axon DOMyrene Buddy         Leukocytosis 07/05/2022   Local reaction to hymenoptera sting 05/09/2023   Low back pain 09/08/2020   Major depressive disorder 02/05/2013   Malignant neoplasm of lower-outer quadrant of right breast of female, estrogen receptor positive 01/03/2013   Nasal turbinate hypertrophy 05/12/2016   Oral herpes 02/05/2023   OSA and COPD overlap syndrome 06/10/2020   Osteoporosis 01/2019   T score -2.2 stable/improved from prior study   Pelvic pain    Peripheral neuropathy 05/22/2014   Personal history of radiation therapy    Pneumonia    Primary insomnia 06/25/2017   Pulmonary nodules 02/12/2015   Rash and other nonspecific skin eruption 05/09/2023   Recurrent falls 05/02/2020   Recurrent sinusitis  02/25/2019   Right arm pain 07/20/2016   RLS (restless legs syndrome) 11/09/2019   RUQ pain 07/05/2022   S/P radiation therapy 11/12/12 - 12/05/12   right Breast  Sciatica 04/30/2015   Sepsis 2014   from UTI    Sjogren's disease 12/12/2021   Splenic lesion 09/02/2012   Sprain of lateral ligament of ankle joint 07/31/2023   Stage 3 chronic kidney disease    Statin intolerance 02/29/2020   Stricture and stenosis of esophagus 10/19/2011   Tongue lesion 02/05/2023   Tremor 07/05/2022   Urgency of urination    Vertigo 01/22/2023   Vitamin B12 deficiency 03/08/2021   Vocal cord cyst 05/18/2021    Past Surgical History:  Procedure Laterality Date   24 HOUR PH STUDY N/A 04/03/2016   Procedure: 24 HOUR PH STUDY;  Surgeon: Ruffin Frederick, MD;  Location: [facility];  Service: Gastroenterology;  Laterality: N/A;   BREAST BIOPSY Right 08/23/2012   ADH   BREAST BIOPSY Right 10/11/2012   Ductal Carcinoma   BREAST EXCISIONAL BIOPSY Right 09/04/2013   benign   BREAST LUMPECTOMY Right 10/11/2012   W/ [facility] BX   CARDIAC CATHETERIZATION  09-13-2007  DR Riley Kill   WELL-PRESERVED LVF/  DIFFUSE SCATTERED CORONARY CALCIFACATION AND ATHEROSCLEROSIS WITHOUT OBSTRUCTION   CARDIAC CATHETERIZATION  08-04-2010  DR MCALHANY   NON-OBSTRUCTIVE CAD/  pLAD 40%/  oLAD 30%/  mLAD 30%/  pRCA 30%/  EF 60%   CARDIOVASCULAR STRESS TEST  06-18-2012  DR McALHANY   LOW RISK NUCLEAR STUDY/  SMALL FIXED AREA OF MODERATELY DECREASED UPTAKE IN ANTEROSEPTAL WALL WHICH MAY BE ARTIFACTUAL/  NO ISCHEMIA/  EF 68%   COLONOSCOPY  09/29/2010   CYSTOSCOPY     CYSTOSCOPY WITH HYDRODISTENSION AND BIOPSY N/A 03/06/2014   Procedure: CYSTOSCOPY/HYDRODISTENSION/ INSTILATION OF MARCAINE AND PYRIDIUM;  Surgeon: Kathi Ludwig, MD;  Location: [facility];  Service: Urology;  Laterality: N/A;   CYSTOSCOPY/URETEROSCOPY/HOLMIUM LASER/STENT PLACEMENT Left 03/21/2023   Procedure: CYSTOSCOPY LEFT RETROGRADE  PYELOGRAM, LEFT URETEROSCOPY, HOLMIUM LASER LITHOTRIPSYM, AND LEFT URETERAL STENT PLACEMENT;  Surgeon: Rene Paci, MD;  Location: [facility];  Service: Urology;  Laterality: Left;  60 MINUTES   ESOPHAGEAL MANOMETRY N/A 04/03/2016   Procedure: ESOPHAGEAL MANOMETRY (EM);  Surgeon: Ruffin Frederick, MD;  Location: [facility];  Service: Gastroenterology;  Laterality: N/A;   EXTRACORPOREAL SHOCK WAVE LITHOTRIPSY Left 02/06/2019   Procedure: EXTRACORPOREAL SHOCK WAVE LITHOTRIPSY (ESWL);  Surgeon: Ihor Gully, MD;  Location: [facility];  Service: Urology;  Laterality: Left;   NASAL SINUS SURGERY  1985   ORIF RIGHT ANKLE  FX  2006   POLYPECTOMY     REMOVAL VOCAL CORD CYST  01/2013   RIGHT BREAST BX  08/23/2012   RIGHT HAND SURGERY  X3  LAST ONE 2009   INCLUDES  ORIF RIGHT 5TH FINGER AND REVISION TWICE   SKIN CANCER EXCISION     face,arms   TONSILLECTOMY AND ADENOIDECTOMY  AGE 30   TOTAL ABDOMINAL HYSTERECTOMY W/ BILATERAL SALPINGOOPHORECTOMY  1982   W/  APPENDECTOMY   TRANSTHORACIC ECHOCARDIOGRAM  06/24/2012   GRADE I DIASTOLIC DYSFUNCTION/  EF 55-60%/  MILD MR   UPPER GASTROINTESTINAL ENDOSCOPY      Family History  Problem Relation Age of Onset   Rectal cancer Mother    Colon cancer Mother    Pancreatic cancer Mother    Diabetes Mother    Breast cancer Mother 20   Breast cancer Maternal Aunt        breast   Birth defects Maternal Aunt    Irritable bowel syndrome Son    Heart disease Son        CAD, MV replacement  Allergic Disorder Daughter    Diabetes Daughter    Colon cancer Father    Colon polyps Father    Diabetes Father    Stroke Father    Heart disease Father        CHF   Hyperlipidemia Father    Hypertension Father    Arthritis Father    Breast cancer Paternal Aunt    Breast cancer Paternal Aunt    Arthritis Paternal Uncle    Allergic Disorder Daughter    Heart disease Cousin        CAD, had a blood clot after stenting   Esophageal cancer Neg Hx     Stomach cancer Neg Hx     Current Medications, Allergies, Family History and Social History were reviewed in [employer] record.     Current Outpatient Medications  Medication Sig Dispense Refill   acetaminophen (TYLENOL) 500 MG tablet Take 1,000 mg by mouth every 6 (six) hours as needed for mild pain or headache.      albuterol (VENTOLIN HFA) 108 (90 Base) MCG/ACT inhaler Inhale 2 puffs into the lungs every 4 (four) hours as needed for wheezing or shortness of breath (coughing fits). 18 g 1   amLODipine (NORVASC) 5 MG tablet Take 5 mg by mouth daily.     aspirin EC 81 MG tablet Take 1 tablet (81 mg total) by mouth daily. 90 tablet 3   atenolol (TENORMIN) 25 MG tablet Take 1 tablet (25 mg total) by mouth 2 (two) times daily. Take 1 tablet by mouth 2 time a day 180 tablet 3   carboxymethylcellul-glycerin (OPTIVE) 0.5-0.9 % ophthalmic solution Place 1 drop into both eyes daily as needed for dry eyes.     Cholecalciferol (VITAMIN D3) 50 MCG (2000 UT) CAPS Take 2,000 Units by mouth daily.     cyanocobalamin (VITAMIN B12) 1000 MCG tablet Take 1,000 mg by mouth daily with breakfast.     diclofenac Sodium (VOLTAREN) 1 % GEL Apply 2 g topically daily as needed (Back pain).     DULoxetine (CYMBALTA) 30 MG capsule Take 1 capsule (30 mg total) by mouth daily at 12 noon. 30 capsule 3   DULoxetine (CYMBALTA) 60 MG capsule Take 1 capsule (60 mg total) by mouth every morning. 90 capsule 1   EPINEPHrine 0.3 mg/0.3 mL IJ SOAJ injection Inject 0.3 mg into the muscle as needed for anaphylaxis. 2 each 1   famotidine (PEPCID) 20 MG tablet Take 1 tablet (20 mg total) by mouth 2 (two) times daily as needed for heartburn or indigestion. 60 tablet 1   FLUoxetine (PROZAC) 20 MG tablet Take 1 tablet (20 mg total) by mouth daily. 30 tablet 2   fluticasone (FLONASE) 50 MCG/ACT nasal spray Use 2 spray(s) in each nostril once daily (Patient taking differently: Place 2 sprays into both nostrils daily  as needed for allergies or rhinitis.) 16 g 5   fluticasone (FLOVENT HFA) 110 MCG/ACT inhaler Inhale 2 puffs into the lungs 2 (two) times daily. 1 each 5   folic acid (FOLVITE) 1 MG tablet Take 1 tablet (1 mg total) by mouth daily.     furosemide (LASIX) 20 MG tablet TAKE 1 TABLET BY MOUTH AS NEEDED FOR EDEMA 90 tablet 3   gabapentin (NEURONTIN) 300 MG capsule Take 300-900 mg by mouth See admin instructions. Take 600 mg in the morning 300 mg at lunch time and 900 mg at bedtime     hydrOXYzine (ATARAX) 10 MG tablet Take 0.5-2 tablets (  5-20 mg total) by mouth 3 (three) times daily as needed for anxiety (not with Loratadine). 90 tablet 2   loratadine (CLARITIN) 10 MG tablet Take 10 mg by mouth daily as needed for rhinitis.     nitroGLYCERIN (NITROSTAT) 0.4 MG SL tablet Place 1 tablet (0.4 mg total) under the tongue every 5 (five) minutes x 3 doses as needed for chest pain. 25 tablet 2   nystatin cream (MYCOSTATIN) Apply 1 application topically 2 (two) times daily. (Patient taking differently: Apply 1 application  topically daily as needed (vaginal yeast).) 30 g 2   pantoprazole (PROTONIX) 40 MG tablet Take 40 mg by mouth 2 (two) times daily.     pentosan polysulfate (ELMIRON) 100 MG capsule Take 1 capsule (100 mg total) by mouth 3 (three) times daily. Reported on 01/05/2016 (Patient taking differently: Take 100 mg by mouth daily as needed (burning and pain). Reported on 01/05/2016) 90 capsule 11   polyethylene glycol (MIRALAX) 17 g packet Take 17 g by mouth 2 (two) times daily. 14 each 0   REPATHA SURECLICK 140 MG/ML SOAJ INJECT 140 MG INTO THE SKIN EVERY 14 DAYS 6 mL 0   sucralfate (CARAFATE) 1 GM/10ML suspension Take 10 mLs (1 g total) by mouth 4 (four) times daily -  with meals and at bedtime. 420 mL 1   traMADol (ULTRAM) 50 MG tablet Take 1 tablet by mouth 3 (three) times daily as needed.     traZODone (DESYREL) 150 MG tablet Take 1 tablet (150 mg total) by mouth at bedtime. 30 tablet 3   Vitamin D,  Ergocalciferol, (DRISDOL) 1.25 MG (50000 UNIT) CAPS capsule Take 1 capsule (50,000 Units total) by mouth every 7 (seven) days. 12x Weeks 5 capsule 3   No current facility-administered medications for this visit.    Review of Systems: No chest pain. No shortness of breath. No urinary complaints.    Physical Exam  Filed Weights   01/24/24 1053  Weight: 126 lb (57.2 kg)   Wt Readings from Last 3 Encounters:  01/24/24 126 lb (57.2 kg)  01/22/24 125 lb (56.7 kg)  01/11/24 127 lb (57.6 kg)    Ht 5\' 5"  (1.651 m)   Wt 126 lb (57.2 kg)   BMI 20.97 kg/m  Constitutional:  Pleasant, generally well appearing female in no acute distress. Psychiatric: Normal mood and affect. Behavior is normal. EENT: Pupils normal.  Conjunctivae are normal. No scleral icterus. Neck supple.  Cardiovascular: Normal rate, regular rhythm.  Pulmonary/chest: Effort normal and breath sounds normal. No wheezing, rales or rhonchi. Abdominal: Soft, nondistended, moderate LLQ tenderness. Bowel sounds active throughout. There are no masses palpable. No hepatomegaly. Neurological: Alert and oriented to person place and time.    Willette Cluster, NP  01/24/2024, 10:59 AM  Cc:  Bradd Canary, MD

## 2024-01-26 NOTE — Progress Notes (Signed)
 Agree with assessment and plan as outlined.

## 2024-01-29 ENCOUNTER — Inpatient Hospital Stay (HOSPITAL_BASED_OUTPATIENT_CLINIC_OR_DEPARTMENT_OTHER): Payer: HMO | Admitting: Hematology & Oncology

## 2024-01-29 ENCOUNTER — Encounter: Payer: Self-pay | Admitting: Hematology & Oncology

## 2024-01-29 ENCOUNTER — Inpatient Hospital Stay: Payer: HMO | Attending: Hematology & Oncology

## 2024-01-29 ENCOUNTER — Other Ambulatory Visit: Payer: Self-pay

## 2024-01-29 VITALS — BP 106/84 | HR 80 | Temp 97.8°F | Resp 19 | Ht 65.0 in | Wt 125.0 lb

## 2024-01-29 DIAGNOSIS — Z79899 Other long term (current) drug therapy: Secondary | ICD-10-CM | POA: Insufficient documentation

## 2024-01-29 DIAGNOSIS — D72823 Leukemoid reaction: Secondary | ICD-10-CM

## 2024-01-29 DIAGNOSIS — K2100 Gastro-esophageal reflux disease with esophagitis, without bleeding: Secondary | ICD-10-CM

## 2024-01-29 DIAGNOSIS — D500 Iron deficiency anemia secondary to blood loss (chronic): Secondary | ICD-10-CM

## 2024-01-29 DIAGNOSIS — M2550 Pain in unspecified joint: Secondary | ICD-10-CM | POA: Insufficient documentation

## 2024-01-29 DIAGNOSIS — C9000 Multiple myeloma not having achieved remission: Secondary | ICD-10-CM

## 2024-01-29 DIAGNOSIS — R9389 Abnormal findings on diagnostic imaging of other specified body structures: Secondary | ICD-10-CM | POA: Insufficient documentation

## 2024-01-29 DIAGNOSIS — Z7982 Long term (current) use of aspirin: Secondary | ICD-10-CM | POA: Insufficient documentation

## 2024-01-29 DIAGNOSIS — I998 Other disorder of circulatory system: Secondary | ICD-10-CM | POA: Insufficient documentation

## 2024-01-29 DIAGNOSIS — D509 Iron deficiency anemia, unspecified: Secondary | ICD-10-CM | POA: Insufficient documentation

## 2024-01-29 DIAGNOSIS — M797 Fibromyalgia: Secondary | ICD-10-CM | POA: Insufficient documentation

## 2024-01-29 LAB — CMP (CANCER CENTER ONLY)
ALT: 6 U/L (ref 0–44)
AST: 16 U/L (ref 15–41)
Albumin: 3.9 g/dL (ref 3.5–5.0)
Alkaline Phosphatase: 95 U/L (ref 38–126)
Anion gap: 9 (ref 5–15)
BUN: 14 mg/dL (ref 8–23)
CO2: 30 mmol/L (ref 22–32)
Calcium: 9 mg/dL (ref 8.9–10.3)
Chloride: 103 mmol/L (ref 98–111)
Creatinine: 1.1 mg/dL — ABNORMAL HIGH (ref 0.44–1.00)
GFR, Estimated: 51 mL/min — ABNORMAL LOW (ref >60)
Glucose, Bld: 93 mg/dL (ref 70–99)
Potassium: 4.1 mmol/L (ref 3.5–5.1)
Sodium: 142 mmol/L (ref 135–145)
Total Bilirubin: 0.4 mg/dL (ref 0.0–1.2)
Total Protein: 6.5 g/dL (ref 6.5–8.1)

## 2024-01-29 LAB — CBC WITH DIFFERENTIAL (CANCER CENTER ONLY)
Abs Immature Granulocytes: 0.03 10*3/uL (ref 0.00–0.07)
Basophils Absolute: 0.1 10*3/uL (ref 0.0–0.1)
Basophils Relative: 1 %
Eosinophils Absolute: 0.3 10*3/uL (ref 0.0–0.5)
Eosinophils Relative: 4 %
HCT: 42.3 % (ref 36.0–46.0)
Hemoglobin: 13.6 g/dL (ref 12.0–15.0)
Immature Granulocytes: 0 %
Lymphocytes Relative: 18 %
Lymphs Abs: 1.3 10*3/uL (ref 0.7–4.0)
MCH: 30.4 pg (ref 26.0–34.0)
MCHC: 32.2 g/dL (ref 30.0–36.0)
MCV: 94.4 fL (ref 80.0–100.0)
Monocytes Absolute: 0.6 10*3/uL (ref 0.1–1.0)
Monocytes Relative: 8 %
Neutro Abs: 5.3 10*3/uL (ref 1.7–7.7)
Neutrophils Relative %: 69 %
Platelet Count: 271 10*3/uL (ref 150–400)
RBC: 4.48 MIL/uL (ref 3.87–5.11)
RDW: 14.5 % (ref 11.5–15.5)
WBC Count: 7.6 10*3/uL (ref 4.0–10.5)
nRBC: 0 % (ref 0.0–0.2)

## 2024-01-29 LAB — IRON AND IRON BINDING CAPACITY (CC-WL,HP ONLY)
Iron: 73 ug/dL (ref 28–170)
Saturation Ratios: 35 % — ABNORMAL HIGH (ref 10.4–31.8)
TIBC: 210 ug/dL — ABNORMAL LOW (ref 250–450)
UIBC: 137 ug/dL — ABNORMAL LOW (ref 148–442)

## 2024-01-29 LAB — SEDIMENTATION RATE: Sed Rate: 17 mm/h (ref 0–22)

## 2024-01-29 LAB — LACTATE DEHYDROGENASE: LDH: 149 U/L (ref 98–192)

## 2024-01-29 LAB — FERRITIN: Ferritin: 174 ng/mL (ref 11–307)

## 2024-01-29 NOTE — Progress Notes (Signed)
 Hematology and Oncology Follow Up Visit  [name] [id number] May 12, 1946 78 y.o. 01/29/2024   Principle Diagnosis:  Abnormal MRI Iron deficiency anemia  Current Therapy:   IV iron-Feraheme given on 10/04/2023     Interim History:  [name] is back for follow-up.  She still feels about the same.  Again, no matter what test we have done, we have not uncovered anything that is obvious as it was causing her problems.  She still feels very tired.  She still has arthralgias and myalgias.  I know that she does have fibromyalgia which could be an issue.  We actually did do a PET scan on her.  The PET scan was done on 01/21/2024.  The PET scan did not show any obvious metabolic issues that looked malignant.  We did send off some additional blood test on her today.  We will see what they show.  She concern had procedures on if necessary.  Again I do not see any evidence of malignancy with her.  I see did do a breast exam on her today.  She has a lot of tenderness with the right breast in the outer quadrants of the breast.  There is no erythema or swelling.  I am not sure as to why she has this.  I think she is due for a mammogram this month.  She did have a CT of the brain.  This was done on 1/21/202 5.  This did not show any thing that looked obvious.  She had some mild chronic small vessel ischemic disease.  Again I just do not see anything that is malignant or hematologic with her.  Currently, I would have said that her performance status is probably ECOG 1-2.     Medications:  Current Outpatient Medications:    acetaminophen  (TYLENOL ) 500 MG tablet, Take 1,000 mg by mouth every 6 (six) hours as needed for mild pain or headache. , Disp: , Rfl:    albuterol  (VENTOLIN  HFA) 108 (90 Base) MCG/ACT inhaler, Inhale 2 puffs into the lungs every 4 (four) hours as needed for wheezing or shortness of breath (coughing fits)., Disp: 18 g, Rfl: 1   amLODipine  (NORVASC ) 5 MG tablet, Take 5 mg by  mouth daily., Disp: , Rfl:    aspirin  EC 81 MG tablet, Take 1 tablet (81 mg total) by mouth daily., Disp: 90 tablet, Rfl: 3   atenolol  (TENORMIN ) 25 MG tablet, Take 1 tablet (25 mg total) by mouth 2 (two) times daily. Take 1 tablet by mouth 2 time a day, Disp: 180 tablet, Rfl: 3   carboxymethylcellul-glycerin (OPTIVE) 0.5-0.9 % ophthalmic solution, Place 1 drop into both eyes daily as needed for dry eyes., Disp: , Rfl:    Cholecalciferol  (VITAMIN D3) 50 MCG (2000 UT) CAPS, Take 2,000 Units by mouth daily., Disp: , Rfl:    cyanocobalamin  (VITAMIN B12) 1000 MCG tablet, Take 1,000 mg by mouth daily with breakfast., Disp: , Rfl:    diclofenac Sodium (VOLTAREN) 1 % GEL, Apply 2 g topically daily as needed (Back pain)., Disp: , Rfl:    DULoxetine  (CYMBALTA ) 30 MG capsule, Take 1 capsule (30 mg total) by mouth daily at 12 noon., Disp: 30 capsule, Rfl: 3   DULoxetine  (CYMBALTA ) 60 MG capsule, Take 1 capsule (60 mg total) by mouth every morning., Disp: 90 capsule, Rfl: 1   EPINEPHrine  0.3 mg/0.3 mL IJ SOAJ injection, Inject 0.3 mg into the muscle as needed for anaphylaxis., Disp: 2 each, Rfl: 1  famotidine  (PEPCID ) 20 MG tablet, Take 1 tablet (20 mg total) by mouth 2 (two) times daily as needed for heartburn or indigestion., Disp: 60 tablet, Rfl: 1   FLUoxetine  (PROZAC ) 20 MG tablet, Take 1 tablet (20 mg total) by mouth daily., Disp: 30 tablet, Rfl: 2   fluticasone  (FLONASE ) 50 MCG/ACT nasal spray, Use 2 spray(s) in each nostril once daily (Patient taking differently: Place 2 sprays into both nostrils daily as needed for allergies or rhinitis.), Disp: 16 g, Rfl: 5   fluticasone  (FLOVENT  HFA) 110 MCG/ACT inhaler, Inhale 2 puffs into the lungs 2 (two) times daily., Disp: 1 each, Rfl: 5   folic acid  (FOLVITE ) 1 MG tablet, Take 1 tablet (1 mg total) by mouth daily., Disp: , Rfl:    furosemide  (LASIX ) 20 MG tablet, TAKE 1 TABLET BY MOUTH AS NEEDED FOR EDEMA, Disp: 90 tablet, Rfl: 3   gabapentin  (NEURONTIN ) 300 MG  capsule, Take 300-900 mg by mouth See admin instructions. Take 600 mg in the morning 300 mg at lunch time and 900 mg at bedtime, Disp: , Rfl:    hydrOXYzine  (ATARAX ) 10 MG tablet, Take 0.5-2 tablets (5-20 mg total) by mouth 3 (three) times daily as needed for anxiety (not with Loratadine )., Disp: 90 tablet, Rfl: 2   loratadine  (CLARITIN ) 10 MG tablet, Take 10 mg by mouth daily as needed for rhinitis., Disp: , Rfl:    nitroGLYCERIN  (NITROSTAT ) 0.4 MG SL tablet, Place 1 tablet (0.4 mg total) under the tongue every 5 (five) minutes x 3 doses as needed for chest pain., Disp: 25 tablet, Rfl: 2   nystatin  cream (MYCOSTATIN ), Apply 1 application topically 2 (two) times daily. (Patient taking differently: Apply 1 application  topically daily as needed (vaginal yeast).), Disp: 30 g, Rfl: 2   pantoprazole  (PROTONIX ) 40 MG tablet, Take 40 mg by mouth 2 (two) times daily., Disp: , Rfl:    pentosan polysulfate (ELMIRON ) 100 MG capsule, Take 1 capsule (100 mg total) by mouth 3 (three) times daily. Reported on 01/05/2016 (Patient taking differently: Take 100 mg by mouth daily as needed (burning and pain). Reported on 01/05/2016), Disp: 90 capsule, Rfl: 11   polyethylene glycol (MIRALAX ) 17 g packet, Take 17 g by mouth 2 (two) times daily., Disp: 14 each, Rfl: 0   REPATHA  SURECLICK 140 MG/ML SOAJ, INJECT 140 MG INTO THE SKIN EVERY 14 DAYS, Disp: 6 mL, Rfl: 0   sucralfate  (CARAFATE ) 1 GM/10ML suspension, Take 10 mLs (1 g total) by mouth 4 (four) times daily -  with meals and at bedtime., Disp: 420 mL, Rfl: 1   traMADol  (ULTRAM ) 50 MG tablet, Take 1 tablet by mouth 3 (three) times daily as needed., Disp: , Rfl:    traZODone  (DESYREL ) 150 MG tablet, Take 1 tablet (150 mg total) by mouth at bedtime., Disp: 30 tablet, Rfl: 3   Vitamin D , Ergocalciferol , (DRISDOL ) 1.25 MG (50000 UNIT) CAPS capsule, Take 1 capsule (50,000 Units total) by mouth every 7 (seven) days. 12x Weeks, Disp: 5 capsule, Rfl: 3  Allergies:  Allergies   Allergen Reactions   Clindamycin Hcl Shortness Of Breath and Rash   Penicillins Anaphylaxis   Rosuvastatin  Anaphylaxis   Baclofen  Rash   Codeine Hives and Nausea Only   Erythromycin Hives   Latex Hives   Lincomycin Other (See Comments)   Pentazocine Lactate Other (See Comments)    HALLUCINATION   Pneumococcal Vaccine Polyvalent Hives, Swelling and Other (See Comments)    REACTION: swelling at injection site  Prednisone  Rash    Other reaction(s): Rash, Hives - can tolerate if she takes with benadryl     Tamoxifen  Nausea And Vomiting   Haemophilus Influenzae Other (See Comments)    Local reaction at the site    Haemophilus Influenzae Vaccines Other (See Comments)    Local reaction at site   Levofloxacin      Other reaction(s): sick   Other Other (See Comments)    Father had malignant hyperthermia   Doxycycline  Rash   Fluzone [Influenza Virus Vaccine] Hives    Local reaction at the site    Past Medical History, Surgical history, Social history, and Family History were reviewed and updated.  Review of Systems: Review of Systems  Constitutional:  Positive for fatigue.  HENT:  Negative.    Eyes: Negative.   Respiratory: Negative.    Cardiovascular: Negative.   Gastrointestinal:  Positive for abdominal pain.  Endocrine: Negative.   Genitourinary: Negative.    Musculoskeletal:  Positive for myalgias.  Neurological: Negative.   Hematological: Negative.   Psychiatric/Behavioral: Negative.      Physical Exam:  height is 5' 5 (1.651 m) and weight is 125 lb (56.7 kg). Her oral temperature is 97.8 F (36.6 C). Her blood pressure is 106/84 and her pulse is 80. Her respiration is 19 and oxygen saturation is 100%.   Wt Readings from Last 3 Encounters:  01/29/24 125 lb (56.7 kg)  01/24/24 126 lb (57.2 kg)  01/22/24 125 lb (56.7 kg)    Physical Exam Vitals reviewed.  HENT:     Head: Normocephalic and atraumatic.  Eyes:     Pupils: Pupils are equal, round, and reactive to  light.  Cardiovascular:     Rate and Rhythm: Normal rate and regular rhythm.     Heart sounds: Normal heart sounds.  Pulmonary:     Effort: Pulmonary effort is normal.     Breath sounds: Normal breath sounds.  Abdominal:     General: Bowel sounds are normal.     Palpations: Abdomen is soft.  Musculoskeletal:        General: No tenderness or deformity. Normal range of motion.     Cervical back: Normal range of motion.  Lymphadenopathy:     Cervical: No cervical adenopathy.  Skin:    General: Skin is warm and dry.     Findings: No erythema or rash.  Neurological:     Mental Status: She is alert and oriented to person, place, and time.  Psychiatric:        Behavior: Behavior normal.        Thought Content: Thought content normal.        Judgment: Judgment normal.     Lab Results  Component Value Date   WBC 7.6 01/29/2024   HGB 13.6 01/29/2024   HCT 42.3 01/29/2024   MCV 94.4 01/29/2024   PLT 271 01/29/2024     Chemistry      Component Value Date/Time   NA 142 01/29/2024 1044   NA 138 05/25/2023 1231   NA 140 04/20/2015 1050   K 4.1 01/29/2024 1044   K 3.9 04/20/2015 1050   CL 103 01/29/2024 1044   CL 103 11/12/2012 1549   CO2 30 01/29/2024 1044   CO2 26 04/20/2015 1050   BUN 14 01/29/2024 1044   BUN 13 05/25/2023 1231   BUN 13.1 04/20/2015 1050   CREATININE 1.10 (H) 01/29/2024 1044   CREATININE 1.1 04/20/2015 1050      Component Value Date/Time  CALCIUM  9.0 01/29/2024 1044   CALCIUM  8.9 04/20/2015 1050   ALKPHOS 95 01/29/2024 1044   ALKPHOS 87 04/20/2015 1050   AST 16 01/29/2024 1044   AST 23 04/20/2015 1050   ALT 6 01/29/2024 1044   ALT 16 04/20/2015 1050   BILITOT 0.4 01/29/2024 1044   BILITOT 0.26 04/20/2015 1050       Impression and Plan: [name] is a very nice 78 year old white female.  She has numerous health issues.  Again, her bone marrow biopsy was unremarkable although the specimen could have been a little bit more adequate.  I am glad  that the PET scan was unremarkable.  I am really not surprised by this.  We will have to see what the rest of her labs show.  Again I would be shocked if she had anything that looked suspicious for a hematologic issue.  I have to believe that this is all rheumatologic.  She really needs to see a rheumatologist.  I know that she has had a positive ANA in the past.  I do not see anything else that we have to do right now.  She does not need any additional biopsies.  We will see what the rest of her lab work shows.  We will plan to get her back as needed, depending on her lab work.   Maude JONELLE Crease, MD 2/4/202511:33 AM

## 2024-01-30 ENCOUNTER — Other Ambulatory Visit: Payer: HMO

## 2024-01-30 ENCOUNTER — Encounter: Payer: Self-pay | Admitting: Professional

## 2024-01-30 ENCOUNTER — Ambulatory Visit: Payer: HMO | Admitting: Professional

## 2024-01-30 ENCOUNTER — Telehealth: Payer: Self-pay

## 2024-01-30 DIAGNOSIS — F331 Major depressive disorder, recurrent, moderate: Secondary | ICD-10-CM

## 2024-01-30 DIAGNOSIS — R197 Diarrhea, unspecified: Secondary | ICD-10-CM | POA: Diagnosis not present

## 2024-01-30 DIAGNOSIS — F411 Generalized anxiety disorder: Secondary | ICD-10-CM | POA: Diagnosis not present

## 2024-01-30 LAB — IGG, IGA, IGM
IgA: 161 mg/dL (ref 64–422)
IgG (Immunoglobin G), Serum: 831 mg/dL (ref 586–1602)
IgM (Immunoglobulin M), Srm: 142 mg/dL (ref 26–217)

## 2024-01-30 LAB — KAPPA/LAMBDA LIGHT CHAINS
Kappa free light chain: 32.7 mg/L — ABNORMAL HIGH (ref 3.3–19.4)
Kappa, lambda light chain ratio: 1.03 (ref 0.26–1.65)
Lambda free light chains: 31.6 mg/L — ABNORMAL HIGH (ref 5.7–26.3)

## 2024-01-30 LAB — CANCER ANTIGEN 27.29: CA 27.29: 17.1 U/mL (ref 0.0–38.6)

## 2024-01-30 NOTE — Telephone Encounter (Signed)
-----   Message from Ivor Mars sent at 01/30/2024  5:41 AM EST ----- Please call and let her know that the iron level is okay.  Twilla Galea

## 2024-01-30 NOTE — Telephone Encounter (Signed)
 Advised via MyChart.

## 2024-01-30 NOTE — Progress Notes (Signed)
 [facility] Counselor/Therapist Progress Note  Patient ID: [name], MRN: [id number],    Date: 01/30/2024  Time Spent: 47 minutes [id number]   Treatment Type: Individual Therapy  Risk Assessment: Danger to Self:  No Self-injurious Behavior: No Danger to Others: No  Subjective: Patient arrived late for her in-person session due to having been sent to the wrong location.  Issues addressed: 1-physical -has had a lot of appointments -pt admits she feels scared because she doesn't know 2-daughters -aware that she does not have CA -daughter yesterday told her that was great now she needs to get off her butt and take care of herself -spoke with daughter [name] who told her mother she lied about having a mass 3-feels overwhelmed by daughters criticisms -pt reports she wants to be in a room and left alone -she knows it is her allowing herself to fall into them and listen -her spouse has been very upfront that she is too soft, that she entitles her children 4-coping and boundary setting -educated -examples  Treatment Plan:   Goals Alleviate depressive symptoms Recognize, accept, and cope with depressive feelings Develop healthy thinking patterns Develop healthy interpersonal relationships (particularly younger daughter) Reduce overall frequency, intensity, and duration of anxiety Stabilize anxiety level while increasing ability to function Enhance ability to effectively cope with full variety of stressors Learn and implement coping skills that result in a reduction of anxiety    Objectives target date for all objectives is 09/26/2024 Verbalize an understanding of the cognitive, physiological, and behavioral components of anxiety       60 Learning and implement calming skills to reduce overall anxiety           60 Verbalize an understanding of the role that cognitive biases play in excessive irrational worry and persistent anxiety symptoms   70 Identify,  challenge, and replace biased fearful self-talk            60 Learn and implement problem solving strategies             70 Identify and engage in pleasant activities              80 Learning and implement personal and interpersonal skills to reduce anxiety and improve interpersonal relationships    60 Learn to accept limitations in life and commit to tolerating, rather than avoiding, unpleasant emotions while accomplishing meaningful goals 60 Identify major life conflicts from the past and present that form the basis for present anxiety       70 Maintain involvement in work, family, and social activities           70 Reestablish a consistent sleep-wake cycle             50 Verbalize an accurate understanding of depression            60 Identify and replace thoughts that support depression            50 Learn and implement behavioral strategies Verbalize an understanding and resolution of current interpersonal problems Learn and implement decision making skills Learn and implement conflict resolution skills to resolve interpersonal problems Verbalize an understanding of healthy and unhealthy emotions verbalize insight into how past relationships may be influence current experiences with depression Use mindfulness and acceptance strategies and increase value based behavior  Increase hopeful statements about the future.  Interventions Engage the patient in behavioral activation Use instruction, modeling, and role-playing to build the patient 's general social, communication, and/or conflict resolution skills  Use Acceptance and Commitment Therapy to help patient  accept uncomfortable realities in order to accomplish value-consistent goals Reinforce the patient 's insight into the role of her past emotional pain and present anxiety  Support the patient in following through with work, family, and social activities Teach and implement sleep hygiene practices  Teach the patient relaxation  skills Assign the patient homework Discuss examples demonstrating that unrealistic worry overestimates the probability of threats and underestimates patient's ability  Assist the patient in analyzing his or her worries Help patient understand that avoidance is reinforcing  Consistent with treatment model, discuss how change in cognitive, behavioral, and interpersonal can help patient alleviate depression CBT Behavioral activation to help the patient explore the relationship, nature of the dispute Help the patient develop new interpersonal skills and relationships Conduct Problem solving therapy Teach conflict resolution skills Use a process-experiential approach Conduct ACT  Diagnosis:Major depressive disorder, recurrent episode, moderate (HCC)  Generalized anxiety disorder  Plan:  -meet on Wednesday, February 13, 2024 at [facility].

## 2024-01-31 LAB — CLOSTRIDIUM DIFFICILE TOXIN B, QUALITATIVE, REAL-TIME PCR: Toxigenic C. Difficile by PCR: NOT DETECTED

## 2024-02-04 LAB — PROTEIN ELECTROPHORESIS, SERUM, WITH REFLEX
A/G Ratio: 1.4 (ref 0.7–1.7)
Albumin ELP: 3.6 g/dL (ref 2.9–4.4)
Alpha-1-Globulin: 0.2 g/dL (ref 0.0–0.4)
Alpha-2-Globulin: 0.7 g/dL (ref 0.4–1.0)
Beta Globulin: 0.9 g/dL (ref 0.7–1.3)
Gamma Globulin: 0.7 g/dL (ref 0.4–1.8)
Globulin, Total: 2.5 g/dL (ref 2.2–3.9)
Total Protein ELP: 6.1 g/dL (ref 6.0–8.5)

## 2024-02-05 ENCOUNTER — Ambulatory Visit: Payer: Medicare Other | Admitting: Physician Assistant

## 2024-02-05 ENCOUNTER — Ambulatory Visit: Payer: HMO | Admitting: Physician Assistant

## 2024-02-05 VITALS — BP 125/83 | HR 69 | Resp 20 | Ht 65.0 in

## 2024-02-05 DIAGNOSIS — F32A Depression, unspecified: Secondary | ICD-10-CM | POA: Diagnosis not present

## 2024-02-05 DIAGNOSIS — F419 Anxiety disorder, unspecified: Secondary | ICD-10-CM | POA: Diagnosis not present

## 2024-02-05 DIAGNOSIS — R4189 Other symptoms and signs involving cognitive functions and awareness: Secondary | ICD-10-CM

## 2024-02-05 DIAGNOSIS — F322 Major depressive disorder, single episode, severe without psychotic features: Secondary | ICD-10-CM

## 2024-02-05 NOTE — Progress Notes (Signed)
Assessment/Plan:   [name] is a very pleasant 78 y.o. year old RH female with a history of hypertension, hyperlipidemia, seen today for evaluation of subjective  memory loss. Last neuropsych evaluation 07/2023 was normal, not suggestive of symptomatic Alzheimer's disease. CT head personally reviewed without acute intracranial abnormalities, mild chronic small vessel disease and mild cerebral atrophy Patient was concerned because after a "blowout "with his daughter and son, she had on e episode at around the same time of auditory hallucinations.  Considered DAT, but patient declines stating that after talking to [place], she feels better understanding that this may have a psychiatric source.  She had a psychiatrist in the past, and she stopped seeing them, not resuming the care.  She does have a Veterinary surgeon.  We discussed the importance of psychiatric care, which can benefit memory.  She is eager to have that appointment.  She is also having a UTI, but she is asymptomatic for it.  Memory Impairment of unclear etiology Subjective memory complaints    Repeat Neurocognitive testing in 18-24 months to further evaluate cognitive concerns and determine other underlying cause of memory changes, including potential contribution from sleep, anxiety, attention, or depression  Replenish B12 (296) Referral to psychiatry for MDD, anxiety and auditory hallucinations, medication management Continue counseling for anxiety and depression  Recommend good control of cardiovascular risk factors Follow  Iron deficiency  anemia with Oncology Recommend sleep study for REM behavior  Folllow up as scheduled for memory on April 2025  monitor polypharmacy, she takes multiple muscle relaxers, sleep aids, pain medications which may contribute to memory  Subjective:   The patient is here alone.  Any memory changes? Reports some difficulty remembering new information, conversations and names.  Long-term memory is  good. repeats oneself?  Denies Disoriented when walking into a room?  Patient denies except occasionally not remembering what patient came to the room for    Leaving objects in unusual places? Denies.   Wandering behavior?  denies .  Any personality changes?  She has been increasingly more anxious, with a component of depression.  1 month ago, the patient had a "blowout "argument with her adult daughter and son, telling them not to be so negative and she wants to eliminate the negativity in the house.  They are working on their issues, but since that time, the patient had 1 episode of auditory hallucinations hearing her daughter singing opera and her son telling her to drive the Camaro.  There were no recurrences.  She got concerned, but she feels that this may be related to stress, she has low suspicion for a component of dementia regarding this hallucination. Any history of depression?:  Endorsed.  She has a history of major depression dating back to decades ago after the death of her husband.  She had other traumatic experiences, she reports that they are "coming at me ".  She has not had a psychiatrist in many years and she recently has begun to see a counselor whom she likes.  In the past, she was told to do hypnosis-progression to address the main issue, but she failed to do so, she is interested in discussing this with psychiatry further.  "I want to get better, wants to try to get out of so many medications, I feel that I am spiraling down with these many medicines ".  Recently she has been given Prozac, which she never took. Hallucinations or paranoia?  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  As before, she was reading the book and began seeing fully formed figures and as mentioned above hearing things. Any sleep changes? Does not sleep well, needs 100 mg trazodone and tramadol at night to be able to sleep.Denies vivid dreams, REM behavior or  sleepwalking   Sleep apnea?  Denies   Any hygiene concerns?  Denies   Independent of bathing and dressing?  Endorsed  Does the patient needs help with medications? Patient is in charge  Who is in charge of the finances? Patient is in charge    Any changes in appetite?  Denies    Patient have trouble swallowing? Denies.   Does the patient cook Not much. Any kitchen accidents such as leaving the stove on? Denies.   Any history of headaches?   Denies.   Chronic pain ? Denies.   Ambulates with difficulty?  Denies.  Recent falls or head injuries? Denies.   Vision changes? Denies.   Unilateral weakness, numbness or tingling? Denies.   Any tremors?   Denies.   Any anosmia?  Denies.   Any incontinence of urine? Denies.  She is still dealing with a UTI as of today.  She is about to start taking Any bowel dysfunction? Denies.      Patient lives alone History of heavy alcohol intake? Denies.   History of heavy tobacco use? Denies.   Family history of dementia? Denies.  Does patient drive? Yes, denies any issues.  Neuropsych evaluation 07/2723 Briefly, results suggested a relative weakness surrounding verbal fluency (both phonemic and semantic). She also exhibited very slight performance variability across both executive functioning and visuospatial abilities. However, said variability amounted to a single low score across each domain coupled with numerous intact performances, suggesting that this degree of variability is quite minimal and these domains are largely within normal limits. Performances were appropriate relative to age-matched peers across processing speed, attention/concentration, receptive language, confrontation naming, and all aspects of learning and memory. Responses across mood-related questionnaires suggested acute levels of moderate depression and anxiety within the past 1-2 weeks, as well as mild sleep dysfunction. This mirrors her report during interview, as well as behavioral  observations throughout testing. Prominent psychiatric distress can absolutely have a negative impact across cognitive functioning, both across a testing environment, as well as in her day-to-day life. Deficits with attention/concentration, executive functioning, verbal fluency, and encoding/retrieval aspects of memory are very commonly reported. Ms. Kittleson further described a history of fibromyalgia/chronic pain and significant fatigue. She is also on a very large number of medications (if the current medication list in her chart is accurate), increasing the likelihood for a polypharmacy component to her current presentation. Given current evidence, the combination of these factors would appear to be the likely explanation for day-to-day subjective impairment.    Past Medical History:  Diagnosis Date   Abdominal pain 10/26/2013   Allergic rhinitis 01/25/2016   ANA positive 07/29/2013   Anemia    Arthralgia 08/18/2013   Arthritis    Asthma, allergic 08/05/2012   Atrial flutter    past history- not current   Atypical chest pain 01/01/2015   Bilateral carpal tunnel syndrome 03/04/2015   CAD in native artery 08/25/2010   Minor CAD 2009, 2011, low risk Myoview 2013 and 2016        Cataract    bilaterally removed    Cellulitis of breast 02/05/2023   Chronic constipation    Chronic cough 01/09/2017   Chronic interstitial cystitis 02/01/2017  Chronic night sweats 01/08/2015   Common bile duct dilatation 02/13/2023   Concussion    x 3   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   DDD (degenerative disc disease), cervical 09/17/2018   Degenerative scoliosis 04/22/2018   Deviated septum 05/12/2016   Dysphagia    Dyspnea    Dysuria 03/09/2021   Elevated alkaline phosphatase level 01/25/2023   Essential hypertension 08/25/2010   External hemorrhoids 02/05/2015   Facial trauma 01/22/2023   Family history of adverse reaction to anesthesia    Family history of malignant hyperthermia    father had  this   Fibrocystic breast disease 10/18/2010   Fibromyalgia    Frequency of urination    Generalized anxiety disorder 08/03/2010   GERD (gastroesophageal reflux disease)    Headache 10/13/2013   High serum vitamin D 05/18/2021   History of chronic bronchitis    History of colonic polyp 02/25/2019   History of hiatal hernia    History of tachycardia    CONTROLLED  WITH ATENOLOL   Hyperglycemia 03/08/2021   Hyperkalemia 09/28/2022   Hyperlipidemia    Intertrigo 12/13/2022   Kidney stone 02/26/2019   Knee pain, right 04/30/2015   Left hip pain 04/30/2015   Leg pain 02/24/2011   Qualifier: Diagnosis of   By: Laury Axon DOMyrene Buddy         Leukocytosis 07/05/2022   Local reaction to hymenoptera sting 05/09/2023   Low back pain 09/08/2020   Major depressive disorder 02/05/2013   Malignant neoplasm of lower-outer quadrant of right breast of female, estrogen receptor positive 01/03/2013   Nasal turbinate hypertrophy 05/12/2016   Oral herpes 02/05/2023   OSA and COPD overlap syndrome 06/10/2020   Osteoporosis 01/2019   T score -2.2 stable/improved from prior study   Pelvic pain    Peripheral neuropathy 05/22/2014   Personal history of radiation therapy    Pneumonia    Primary insomnia 06/25/2017   Pulmonary nodules 02/12/2015   Rash and other nonspecific skin eruption 05/09/2023   Recurrent falls 05/02/2020   Recurrent sinusitis 02/25/2019   Right arm pain 07/20/2016   RLS (restless legs syndrome) 11/09/2019   RUQ pain 07/05/2022   S/P radiation therapy 11/12/12 - 12/05/12   right Breast   Sciatica 04/30/2015   Sepsis 2014   from UTI    Sjogren's disease 12/12/2021   Splenic lesion 09/02/2012   Sprain of lateral ligament of ankle joint 07/31/2023   Stage 3 chronic kidney disease    Statin intolerance 02/29/2020   Stricture and stenosis of esophagus 10/19/2011   Tongue lesion 02/05/2023   Tremor 07/05/2022   Urgency of urination    Vertigo 01/22/2023   Vitamin B12  deficiency 03/08/2021   Vocal cord cyst 05/18/2021     Past Surgical History:  Procedure Laterality Date   24 HOUR PH STUDY N/A 04/03/2016   Procedure: 24 HOUR PH STUDY;  Surgeon: Ruffin Frederick, MD;  Location: [facility];  Service: Gastroenterology;  Laterality: N/A;   BREAST BIOPSY Right 08/23/2012   ADH   BREAST BIOPSY Right 10/11/2012   Ductal Carcinoma   BREAST EXCISIONAL BIOPSY Right 09/04/2013   benign   BREAST LUMPECTOMY Right 10/11/2012   W/ [facility] BX   CARDIAC CATHETERIZATION  09-13-2007  DR Riley Kill   WELL-PRESERVED LVF/  DIFFUSE SCATTERED CORONARY CALCIFACATION AND ATHEROSCLEROSIS WITHOUT OBSTRUCTION   CARDIAC CATHETERIZATION  08-04-2010  DR Clifton James   NON-OBSTRUCTIVE CAD/  pLAD 40%/  oLAD 30%/  mLAD 30%/  pRCA 30%/  EF  60%   CARDIOVASCULAR STRESS TEST  06-18-2012  DR McALHANY   LOW RISK NUCLEAR STUDY/  SMALL FIXED AREA OF MODERATELY DECREASED UPTAKE IN ANTEROSEPTAL WALL WHICH MAY BE ARTIFACTUAL/  NO ISCHEMIA/  EF 68%   COLONOSCOPY  09/29/2010   CYSTOSCOPY     CYSTOSCOPY WITH HYDRODISTENSION AND BIOPSY N/A 03/06/2014   Procedure: CYSTOSCOPY/HYDRODISTENSION/ INSTILATION OF MARCAINE AND PYRIDIUM;  Surgeon: Kathi Ludwig, MD;  Location: [facility];  Service: Urology;  Laterality: N/A;   CYSTOSCOPY/URETEROSCOPY/HOLMIUM LASER/STENT PLACEMENT Left 03/21/2023   Procedure: CYSTOSCOPY LEFT RETROGRADE PYELOGRAM, LEFT URETEROSCOPY, HOLMIUM LASER LITHOTRIPSYM, AND LEFT URETERAL STENT PLACEMENT;  Surgeon: Rene Paci, MD;  Location: [facility];  Service: Urology;  Laterality: Left;  60 MINUTES   ESOPHAGEAL MANOMETRY N/A 04/03/2016   Procedure: ESOPHAGEAL MANOMETRY (EM);  Surgeon: Ruffin Frederick, MD;  Location: [facility];  Service: Gastroenterology;  Laterality: N/A;   EXTRACORPOREAL SHOCK WAVE LITHOTRIPSY Left 02/06/2019   Procedure: EXTRACORPOREAL SHOCK WAVE LITHOTRIPSY (ESWL);  Surgeon: Ihor Gully, MD;  Location: [facility];  Service:  Urology;  Laterality: Left;   NASAL SINUS SURGERY  1985   ORIF RIGHT ANKLE  FX  2006   POLYPECTOMY     REMOVAL VOCAL CORD CYST  01/2013   RIGHT BREAST BX  08/23/2012   RIGHT HAND SURGERY  X3  LAST ONE 2009   INCLUDES  ORIF RIGHT 5TH FINGER AND REVISION TWICE   SKIN CANCER EXCISION     face,arms   TONSILLECTOMY AND ADENOIDECTOMY  AGE 3   TOTAL ABDOMINAL HYSTERECTOMY W/ BILATERAL SALPINGOOPHORECTOMY  1982   W/  APPENDECTOMY   TRANSTHORACIC ECHOCARDIOGRAM  06/24/2012   GRADE I DIASTOLIC DYSFUNCTION/  EF 55-60%/  MILD MR   UPPER GASTROINTESTINAL ENDOSCOPY       Allergies  Allergen Reactions   Clindamycin Hcl Shortness Of Breath and Rash   Penicillins Anaphylaxis   Rosuvastatin Anaphylaxis   Baclofen Rash   Codeine Hives and Nausea Only   Erythromycin Hives   Latex Hives   Lincomycin Other (See Comments)   Pentazocine Lactate Other (See Comments)    HALLUCINATION   Pneumococcal Vaccine Polyvalent Hives, Swelling and Other (See Comments)    REACTION: swelling at injection site   Prednisone Rash    Other reaction(s): Rash, Hives - can tolerate if she takes with benadryl    Tamoxifen Nausea And Vomiting   Haemophilus Influenzae Other (See Comments)    Local reaction at the site    Haemophilus Influenzae Vaccines Other (See Comments)    Local reaction at site   Levofloxacin     Other reaction(s): sick   Other Other (See Comments)    Father had malignant hyperthermia   Doxycycline Rash   Fluzone [Influenza Virus Vaccine] Hives    Local reaction at the site    Current Outpatient Medications  Medication Instructions   acetaminophen (TYLENOL) 1,000 mg, Every 6 hours PRN   albuterol (VENTOLIN HFA) 108 (90 Base) MCG/ACT inhaler 2 puffs, Inhalation, Every 4 hours PRN   amLODipine (NORVASC) 5 mg, Daily   aspirin EC 81 mg, Oral, Daily   atenolol (TENORMIN) 25 mg, Oral, 2 times daily, Take 1 tablet by mouth 2 time a day   carboxymethylcellul-glycerin (OPTIVE) 0.5-0.9 % ophthalmic  solution 1 drop, Daily PRN   cyanocobalamin (VITAMIN B12) 1,000 mg, Daily with breakfast   diclofenac Sodium (VOLTAREN) 2 g, Daily PRN   DULoxetine (CYMBALTA) 60 mg, Oral, Every morning   DULoxetine (CYMBALTA) 30 mg, Oral,  Daily   EPINEPHrine (EPI-PEN) 0.3 mg, Intramuscular, As needed   famotidine (PEPCID) 20 mg, Oral, 2 times daily PRN   FLUoxetine (PROZAC) 20 mg, Oral, Daily   fluticasone (FLONASE) 50 MCG/ACT nasal spray Use 2 spray(s) in each nostril once daily   fluticasone (FLOVENT HFA) 110 MCG/ACT inhaler 2 puffs, Inhalation, 2 times daily   folic acid (FOLVITE) 1 mg, Oral, Daily   furosemide (LASIX) 20 MG tablet TAKE 1 TABLET BY MOUTH AS NEEDED FOR EDEMA   gabapentin (NEURONTIN) 300-900 mg, See admin instructions   hydrOXYzine (ATARAX) 5-20 mg, Oral, 3 times daily PRN   loratadine (CLARITIN) 10 mg, Daily PRN   nitroGLYCERIN (NITROSTAT) 0.4 mg, Sublingual, Every 5 min x3 PRN   nystatin cream (MYCOSTATIN) 1 application , Topical, 2 times daily   pantoprazole (PROTONIX) 40 mg, 2 times daily   pentosan polysulfate (ELMIRON) 100 mg, Oral, 3 times daily, Reported on 01/05/2016   polyethylene glycol (MIRALAX) 17 g, Oral, 2 times daily   REPATHA SURECLICK 140 MG/ML SOAJ INJECT 140 MG INTO THE SKIN EVERY 14 DAYS   sucralfate (CARAFATE) 1 g, Oral, 3 times daily with meals & bedtime   traMADol (ULTRAM) 50 MG tablet 1 tablet, 3 times daily PRN   traZODone (DESYREL) 150 mg, Oral, Daily at bedtime   Vitamin D (Ergocalciferol) (DRISDOL) 50,000 Units, Oral, Every 7 days, 12x Weeks   Vitamin D3 2,000 Units, Daily     VITALS:  There were no vitals filed for this visit.    PHYSICAL EXAM   HEENT:  Normocephalic, atraumatic. The superficial temporal arteries are without ropiness or tenderness. Cardiovascular: Regular rate and rhythm. Lungs: Clear to auscultation bilaterally. Neck: There are no carotid bruits noted bilaterally.  NEUROLOGICAL:    05/30/2023    5:00 PM  Montreal Cognitive  Assessment   Visuospatial/ Executive (0/5) 3  Naming (0/3) 2  Attention: Read list of digits (0/2) 2  Attention: Read list of letters (0/1) 1  Attention: Serial 7 subtraction starting at 100 (0/3) 3  Language: Repeat phrase (0/2) 2  Language : Fluency (0/1) 1  Abstraction (0/2) 1  Delayed Recall (0/5) 4  Orientation (0/6) 6  Total 25  Adjusted Score (based on education) 25       08/03/2023   12:00 PM 07/26/2016   10:10 AM  MMSE - Mini Mental State Exam  Orientation to time 5 4  Orientation to Place 5 4  Registration 3 3  Attention/ Calculation 5 5  Recall 3 2  Language- name 2 objects 2 2  Language- repeat 1 1  Language- follow 3 step command 3 2  Language- read & follow direction 1 1  Write a sentence 1 1  Copy design 0 0  Total score 29 25     Orientation:  Alert and oriented to person, place and time. No aphasia or dysarthria. Fund of knowledge is appropriate. Recent and remote memory intact.  Attention and concentration are normal.  Able to name objects and repeat phrases.  Cranial nerves: There is good facial symmetry.  Very anxious and almost tearful, extraocular muscles are intact and visual fields are full to confrontational testing. Speech is fluent and clear. No tongue deviation. Hearing is intact to conversational tone. Tone: Tone is good throughout.  No cogwheeling noted. Sensation: Sensation is intact to light touch. Vibration is intact at the bilateral big toe.  Coordination: The patient has no difficulty with RAM's or FNF bilaterally. Normal finger to nose  Motor: Strength  is 5/5 in the bilateral upper and lower extremities. There is no pronator drift. There are no fasciculations, no visible tremors noted today. DTR's: Deep tendon reflexes are 2/4 bilaterally. Gait and Station: The patient is able to ambulate without difficulty The patient is able to heel toe walk . Gait is cautious and narrow. The patient is able to ambulate in a tandem fashion.       Thank you  for allowing [place] the opportunity to participate in the care of this nice patient. Please do not hesitate to contact us for any questions or concerns.   Total time spent on today's visit was 42 minutes dedicated to this patient today, preparing to see patient, examining the patient, ordering tests and/or medications and counseling the patient, documenting clinical information in the EHR or other health record, independently interpreting results and communicating results to the patient/family, discussing treatment and goals, answering patient's questions and coordinating care.  Cc:  Bradd Canary, MD  Marlowe Kays 02/05/2024 7:31 AM

## 2024-02-05 NOTE — Patient Instructions (Addendum)
Plan  Continue follow with orthopedics /neuropathy Continue treating the iron deficiency anemia Continue counseling Referral to psychiatry for MMD/anxiety, medications  Monitor polypharmacy Recommend sleep study for REM behavior, talk to you primary doctor about the sleep study  Continue to replenish B12 Patient is scheduled already on 03/27/24 at 11:30

## 2024-02-07 DIAGNOSIS — N941 Unspecified dyspareunia: Secondary | ICD-10-CM | POA: Diagnosis not present

## 2024-02-07 DIAGNOSIS — N3010 Interstitial cystitis (chronic) without hematuria: Secondary | ICD-10-CM | POA: Diagnosis not present

## 2024-02-07 DIAGNOSIS — M545 Low back pain, unspecified: Secondary | ICD-10-CM | POA: Diagnosis not present

## 2024-02-07 DIAGNOSIS — R102 Pelvic and perineal pain: Secondary | ICD-10-CM | POA: Diagnosis not present

## 2024-02-12 ENCOUNTER — Ambulatory Visit: Payer: HMO | Admitting: Professional

## 2024-02-13 ENCOUNTER — Ambulatory Visit: Payer: HMO | Admitting: Professional

## 2024-02-13 ENCOUNTER — Encounter: Payer: Self-pay | Admitting: Professional

## 2024-02-13 DIAGNOSIS — F411 Generalized anxiety disorder: Secondary | ICD-10-CM | POA: Diagnosis not present

## 2024-02-13 DIAGNOSIS — F4310 Post-traumatic stress disorder, unspecified: Secondary | ICD-10-CM

## 2024-02-13 DIAGNOSIS — F331 Major depressive disorder, recurrent, moderate: Secondary | ICD-10-CM | POA: Diagnosis not present

## 2024-02-13 NOTE — Progress Notes (Signed)
[facility] Counselor/Therapist Progress Note  Patient ID: [name], MRN: [id number],    Date: 02/13/2024  Time Spent: 43 minutes [id number]   Treatment Type: Individual Therapy  Risk Assessment: Danger to Self:  No Self-injurious Behavior: No Danger to Others: No  Subjective: Patient arrived late for her in-person session due to having been sent to the wrong location.  Issues addressed: 1-mood -had a difficult week -experienced increased anxiety and stress 2-family issues a-pt told daughters she is not going to continue giving them money -she told them she is a negative person but they tell me negative things -pt told them she wanted to stop and that if they cannot say something good she will not hear it -pt has committed to not being negative -both have said "mom, you're really different you're not dragging yourself down" b-[name] wanted help with getting her daughter through school -granddaughter has been accepted at [employer] -[place] will be 42k per year -pt has been diving $100/mo for granddaughter's education   -[name] told the pt that she can stop at any point needed c-she found that her daughter's didn't expect d-[name], her sister -pt called and confronted sister [name] about her parent's estate -[name] was very quiet -[name]'s husband asked her what they want and pt said she wanted out and that was between her and her daughters 3-physical -getting close to being much better -no cancer evidenced on PET scan -doctors are leaning toward an auto-immune disease 4-trauma history a-pt compared in for all symptoms of PTSD b-pt has discussed in past with husband but he doesn't think it is an issue c-she is having nightmares 2-3 times per week, sometimes recalled sometimes not -she calls out to stop during the night -pt cannot recall the last time she had a pleasant dream -she has an easy startle response     Treatment Plan:   Goals Alleviate  depressive symptoms Recognize, accept, and cope with depressive feelings Develop healthy thinking patterns Develop healthy interpersonal relationships (particularly younger daughter) Reduce overall frequency, intensity, and duration of anxiety Stabilize anxiety level while increasing ability to function Enhance ability to effectively cope with full variety of stressors Learn and implement coping skills that result in a reduction of anxiety    Objectives target date for all objectives is 09/26/2024 Verbalize an understanding of the cognitive, physiological, and behavioral components of anxiety       60 Learning and implement calming skills to reduce overall anxiety           60 Verbalize an understanding of the role that cognitive biases play in excessive irrational worry and persistent anxiety symptoms   70 Identify, challenge, and replace biased fearful self-talk            60 Learn and implement problem solving strategies             70 Identify and engage in pleasant activities              80 Learning and implement personal and interpersonal skills to reduce anxiety and improve interpersonal relationships    60 Learn to accept limitations in life and commit to tolerating, rather than avoiding, unpleasant emotions while accomplishing meaningful goals 60 Identify major life conflicts from the past and present that form the basis for present anxiety       70 Maintain involvement in work, family, and social activities           70 Reestablish a consistent sleep-wake cycle  50 Verbalize an accurate understanding of depression            60 Identify and replace thoughts that support depression            50 Learn and implement behavioral strategies Verbalize an understanding and resolution of current interpersonal problems Learn and implement decision making skills Learn and implement conflict resolution skills to resolve interpersonal problems Verbalize an understanding of  healthy and unhealthy emotions verbalize insight into how past relationships may be influence current experiences with depression Use mindfulness and acceptance strategies and increase value based behavior  Increase hopeful statements about the future.  Interventions Engage the patient in behavioral activation Use instruction, modeling, and role-playing to build the patient 's general social, communication, and/or conflict resolution skills Use Acceptance and Commitment Therapy to help patient  accept uncomfortable realities in order to accomplish value-consistent goals Reinforce the patient 's insight into the role of her past emotional pain and present anxiety  Support the patient in following through with work, family, and social activities Teach and implement sleep hygiene practices  Teach the patient relaxation skills Assign the patient homework Discuss examples demonstrating that unrealistic worry overestimates the probability of threats and underestimates patient's ability  Assist the patient in analyzing his or her worries Help patient understand that avoidance is reinforcing  Consistent with treatment model, discuss how change in cognitive, behavioral, and interpersonal can help patient alleviate depression CBT Behavioral activation to help the patient explore the relationship, nature of the dispute Help the patient develop new interpersonal skills and relationships Conduct Problem solving therapy Teach conflict resolution skills Use a process-experiential approach Conduct ACT  Diagnosis:Major depressive disorder, recurrent episode, moderate (HCC)  Generalized anxiety disorder  PTSD (post-traumatic stress disorder)  Plan:  -meet on Monday, February 25, 2024 at [employer].

## 2024-02-15 ENCOUNTER — Other Ambulatory Visit: Payer: Self-pay | Admitting: Family Medicine

## 2024-02-15 DIAGNOSIS — J4489 Other specified chronic obstructive pulmonary disease: Secondary | ICD-10-CM

## 2024-02-15 DIAGNOSIS — M25571 Pain in right ankle and joints of right foot: Secondary | ICD-10-CM

## 2024-02-15 DIAGNOSIS — Z1231 Encounter for screening mammogram for malignant neoplasm of breast: Secondary | ICD-10-CM

## 2024-02-15 DIAGNOSIS — N183 Chronic kidney disease, stage 3 unspecified: Secondary | ICD-10-CM

## 2024-02-15 DIAGNOSIS — M810 Age-related osteoporosis without current pathological fracture: Secondary | ICD-10-CM

## 2024-02-15 DIAGNOSIS — F5101 Primary insomnia: Secondary | ICD-10-CM

## 2024-02-15 DIAGNOSIS — R739 Hyperglycemia, unspecified: Secondary | ICD-10-CM

## 2024-02-15 DIAGNOSIS — I10 Essential (primary) hypertension: Secondary | ICD-10-CM

## 2024-02-15 DIAGNOSIS — N6310 Unspecified lump in the right breast, unspecified quadrant: Secondary | ICD-10-CM

## 2024-02-15 DIAGNOSIS — E785 Hyperlipidemia, unspecified: Secondary | ICD-10-CM

## 2024-02-17 NOTE — Assessment & Plan Note (Signed)
 Supplement and monitor

## 2024-02-17 NOTE — Assessment & Plan Note (Signed)
 Does not tolerate statins.

## 2024-02-17 NOTE — Assessment & Plan Note (Signed)
 Scheduled for ankle arthroscopy in March

## 2024-02-17 NOTE — Assessment & Plan Note (Signed)
 Hydrate and monitor

## 2024-02-17 NOTE — Assessment & Plan Note (Addendum)
 Struggling with the stress of her illness and debility

## 2024-02-17 NOTE — Assessment & Plan Note (Signed)
 Monitor and report, no changes to meds. Encouraged heart healthy diet such as the DASH diet and exercise as tolerated.

## 2024-02-17 NOTE — Assessment & Plan Note (Signed)
 hgba1c acceptable, minimize simple carbs. Increase exercise as tolerated.

## 2024-02-18 ENCOUNTER — Ambulatory Visit (INDEPENDENT_AMBULATORY_CARE_PROVIDER_SITE_OTHER): Payer: HMO | Admitting: Family Medicine

## 2024-02-18 ENCOUNTER — Encounter: Payer: Self-pay | Admitting: Family Medicine

## 2024-02-18 VITALS — BP 118/72 | HR 60 | Temp 98.2°F | Resp 16 | Ht 65.0 in | Wt 125.6 lb

## 2024-02-18 DIAGNOSIS — R519 Headache, unspecified: Secondary | ICD-10-CM

## 2024-02-18 DIAGNOSIS — E538 Deficiency of other specified B group vitamins: Secondary | ICD-10-CM

## 2024-02-18 DIAGNOSIS — Z789 Other specified health status: Secondary | ICD-10-CM

## 2024-02-18 DIAGNOSIS — N183 Chronic kidney disease, stage 3 unspecified: Secondary | ICD-10-CM

## 2024-02-18 DIAGNOSIS — M2550 Pain in unspecified joint: Secondary | ICD-10-CM

## 2024-02-18 DIAGNOSIS — F418 Other specified anxiety disorders: Secondary | ICD-10-CM | POA: Diagnosis not present

## 2024-02-18 DIAGNOSIS — R739 Hyperglycemia, unspecified: Secondary | ICD-10-CM | POA: Diagnosis not present

## 2024-02-18 DIAGNOSIS — I10 Essential (primary) hypertension: Secondary | ICD-10-CM

## 2024-02-18 NOTE — Patient Instructions (Signed)
 Varilux Lighting for seasonal affective disorder

## 2024-02-19 NOTE — Progress Notes (Signed)
 Subjective:    Patient ID: [name], female    DOB: 1946-08-31, 78 y.o.   MRN: [id number]  Chief Complaint  Patient presents with  . Follow-up    HPI Discussed the use of AI scribe software for clinical note transcription with the patient, who gave verbal consent to proceed.  History of Present Illness [name] is a 78 year old female who presents with respiratory symptoms and a rash.  She has been experiencing respiratory symptoms for the past two days, including coughing up brown sputum, a clear runny nose, mild shortness of breath, and a low-grade fever with chills. She also has body aches and chest pain, which she describes as a baseline issue that occasionally worsens. She has been using over-the-counter remedies such as Mucinex, warm tea, lemon, and honey to manage these symptoms.  She presents with a rash that appears after showering, described as itchy and resembling hives, primarily on her thighs, arms, and chest. The rash becomes more prominent with heat exposure. She has not identified any new medications or topical products that could be causing this reaction.  She has a history of immune problems and mentions a previous consultation with an oncologist, who noted a leukomoid reaction. She has not been in contact with her rheumatologist for some time and is considering re-establishing care due to her immune issues.  She is currently taking fluoxetine 20 mg and duloxetine 90 mg (60 mg and 30 mg) daily. She reports feeling better overall and attributes this improvement to her current medication regimen.  She is scheduled for a back injection on March 4th and foot surgery on March 17th, with cardiology clearance obtained. She also plans to have carpal tunnel surgery in the future.    Past Medical History:  Diagnosis Date  . Abdominal pain 10/26/2013  . Allergic rhinitis 01/25/2016  . ANA positive 07/29/2013  . Anemia   . Arthralgia 08/18/2013  . Arthritis   .  Asthma, allergic 08/05/2012  . Atrial flutter    past history- not current  . Atypical chest pain 01/01/2015  . Bilateral carpal tunnel syndrome 03/04/2015  . CAD in native artery 08/25/2010   Minor CAD 2009, 2011, low risk Myoview 2013 and 2016       . Cataract    bilaterally removed   . Cellulitis of breast 02/05/2023  . Chronic constipation   . Chronic cough 01/09/2017  . Chronic interstitial cystitis 02/01/2017  . Chronic night sweats 01/08/2015  . Common bile duct dilatation 02/13/2023  . Concussion    x 3  . 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  . DDD (degenerative disc disease), cervical 09/17/2018  . Degenerative scoliosis 04/22/2018  . Deviated septum 05/12/2016  . Dysphagia   . Dyspnea   . Dysuria 03/09/2021  . Elevated alkaline phosphatase level 01/25/2023  . Essential hypertension 08/25/2010  . External hemorrhoids 02/05/2015  . Facial trauma 01/22/2023  . Family history of adverse reaction to anesthesia   . Family history of malignant hyperthermia    father had this  . Fibrocystic breast disease 10/18/2010  . Fibromyalgia   . Frequency of urination   . Generalized anxiety disorder 08/03/2010  . GERD (gastroesophageal reflux disease)   . Headache 10/13/2013  . High serum vitamin D 05/18/2021  . History of chronic bronchitis   . History of colonic polyp 02/25/2019  . History of hiatal hernia   . History of tachycardia    CONTROLLED  WITH ATENOLOL  . Hyperglycemia 03/08/2021  .  Hyperkalemia 09/28/2022  . Hyperlipidemia   . Intertrigo 12/13/2022  . Kidney stone 02/26/2019  . Knee pain, right 04/30/2015  . Left hip pain 04/30/2015  . Leg pain 02/24/2011   Qualifier: Diagnosis of   By: Janit Bern        . Leukocytosis 07/05/2022  . Local reaction to hymenoptera sting 05/09/2023  . Low back pain 09/08/2020  . Major depressive disorder 02/05/2013  . Malignant neoplasm of lower-outer quadrant of right breast of female, estrogen receptor positive  01/03/2013  . Nasal turbinate hypertrophy 05/12/2016  . Oral herpes 02/05/2023  . OSA and COPD overlap syndrome 06/10/2020  . Osteoporosis 01/2019   T score -2.2 stable/improved from prior study  . Pelvic pain   . Peripheral neuropathy 05/22/2014  . Personal history of radiation therapy   . Pneumonia   . Primary insomnia 06/25/2017  . Pulmonary nodules 02/12/2015  . Rash and other nonspecific skin eruption 05/09/2023  . Recurrent falls 05/02/2020  . Recurrent sinusitis 02/25/2019  . Right arm pain 07/20/2016  . RLS (restless legs syndrome) 11/09/2019  . RUQ pain 07/05/2022  . S/P radiation therapy 11/12/12 - 12/05/12   right Breast  . Sciatica 04/30/2015  . Sepsis 2014   from UTI   . Sjogren's disease 12/12/2021  . Splenic lesion 09/02/2012  . Sprain of lateral ligament of ankle joint 07/31/2023  . Stage 3 chronic kidney disease   . Statin intolerance 02/29/2020  . Stricture and stenosis of esophagus 10/19/2011  . Tongue lesion 02/05/2023  . Tremor 07/05/2022  . Urgency of urination   . Vertigo 01/22/2023  . Vitamin B12 deficiency 03/08/2021  . Vocal cord cyst 05/18/2021    Past Surgical History:  Procedure Laterality Date  . 24 HOUR PH STUDY N/A 04/03/2016   Procedure: 24 HOUR PH STUDY;  Surgeon: Ruffin Frederick, MD;  Location: [facility];  Service: Gastroenterology;  Laterality: N/A;  . BREAST BIOPSY Right 08/23/2012   ADH  . BREAST BIOPSY Right 10/11/2012   Ductal Carcinoma  . BREAST EXCISIONAL BIOPSY Right 09/04/2013   benign  . BREAST LUMPECTOMY Right 10/11/2012   W/ SLN BX  . CARDIAC CATHETERIZATION  09-13-2007  DR Riley Kill   WELL-PRESERVED LVF/  DIFFUSE SCATTERED CORONARY CALCIFACATION AND ATHEROSCLEROSIS WITHOUT OBSTRUCTION  . CARDIAC CATHETERIZATION  08-04-2010  DR Clifton James   NON-OBSTRUCTIVE CAD/  pLAD 40%/  oLAD 30%/  mLAD 30%/  pRCA 30%/  EF 60%  . CARDIOVASCULAR STRESS TEST  06-18-2012  DR McALHANY   LOW RISK NUCLEAR STUDY/  SMALL FIXED AREA  OF MODERATELY DECREASED UPTAKE IN ANTEROSEPTAL WALL WHICH MAY BE ARTIFACTUAL/  NO ISCHEMIA/  EF 68%  . COLONOSCOPY  09/29/2010  . CYSTOSCOPY    . CYSTOSCOPY WITH HYDRODISTENSION AND BIOPSY N/A 03/06/2014   Procedure: CYSTOSCOPY/HYDRODISTENSION/ INSTILATION OF MARCAINE AND PYRIDIUM;  Surgeon: Kathi Ludwig, MD;  Location: [facility];  Service: Urology;  Laterality: N/A;  . CYSTOSCOPY/URETEROSCOPY/HOLMIUM LASER/STENT PLACEMENT Left 03/21/2023   Procedure: CYSTOSCOPY LEFT RETROGRADE PYELOGRAM, LEFT URETEROSCOPY, HOLMIUM LASER LITHOTRIPSYM, AND LEFT URETERAL STENT PLACEMENT;  Surgeon: Rene Paci, MD;  Location: [facility];  Service: Urology;  Laterality: Left;  60 MINUTES  . ESOPHAGEAL MANOMETRY N/A 04/03/2016   Procedure: ESOPHAGEAL MANOMETRY (EM);  Surgeon: Ruffin Frederick, MD;  Location: [facility];  Service: Gastroenterology;  Laterality: N/A;  . EXTRACORPOREAL SHOCK WAVE LITHOTRIPSY Left 02/06/2019   Procedure: EXTRACORPOREAL SHOCK WAVE LITHOTRIPSY (ESWL);  Surgeon: Ihor Gully, MD;  Location: [facility];  Service: Urology;  Laterality: Left;  . NASAL SINUS SURGERY  1985  . ORIF RIGHT ANKLE  FX  2006  . POLYPECTOMY    . REMOVAL VOCAL CORD CYST  01/2013  . RIGHT BREAST BX  08/23/2012  . RIGHT HAND SURGERY  X3  LAST ONE 2009   INCLUDES  ORIF RIGHT 5TH FINGER AND REVISION TWICE  . SKIN CANCER EXCISION     face,arms  . TONSILLECTOMY AND ADENOIDECTOMY  AGE 89  . TOTAL ABDOMINAL HYSTERECTOMY W/ BILATERAL SALPINGOOPHORECTOMY  1982   W/  APPENDECTOMY  . TRANSTHORACIC ECHOCARDIOGRAM  06/24/2012   GRADE I DIASTOLIC DYSFUNCTION/  EF 55-60%/  MILD MR  . UPPER GASTROINTESTINAL ENDOSCOPY      Family History  Problem Relation Age of Onset  . Rectal cancer Mother   . Colon cancer Mother   . Pancreatic cancer Mother   . Diabetes Mother   . Breast cancer Mother 68  . Breast cancer Maternal Aunt        breast  . Birth defects Maternal Aunt   . Irritable  bowel syndrome Son   . Heart disease Son        CAD, MV replacement  . Allergic Disorder Daughter   . Diabetes Daughter   . Colon cancer Father   . Colon polyps Father   . Diabetes Father   . Stroke Father   . Heart disease Father        CHF  . Hyperlipidemia Father   . Hypertension Father   . Arthritis Father   . Breast cancer Paternal Aunt   . Breast cancer Paternal Aunt   . Arthritis Paternal Uncle   . Allergic Disorder Daughter   . Heart disease Cousin        CAD, had a blood clot after stenting  . Esophageal cancer Neg Hx   . Stomach cancer Neg Hx     Social History   Socioeconomic History  . Marital status: Married    Spouse name: Windy Fast   . Number of children: 3  . Years of education: 16  . Highest education level: Bachelor's degree (e.g., BA, AB, BS)  Occupational History  . Occupation: Retired    Comment: Charity fundraiser  Tobacco Use  . Smoking status: Former    Current packs/day: 0.00    Average packs/day: 1 pack/day for 15.0 years (15.0 ttl pk-yrs)    Types: Cigarettes    Start date: 05/27/1990    Quit date: 05/27/2005    Years since quitting: 18.7    Passive exposure: Never  . Smokeless tobacco: Never  Vaping Use  . Vaping status: Never Used  Substance and Sexual Activity  . Alcohol use: No    Alcohol/week: 0.0 standard drinks of alcohol  . Drug use: No  . Sexual activity: Not Currently    Partners: Male    Birth control/protection: Surgical    Comment: 1st intercourse 78 yo-Fewer than 5 partners, hysterectomy  Other Topics Concern  . Not on file  Social History Narrative   Lives with husband.   Caffeine use: 1/2 cup per day   Exercise-- 2days a week [employer],  Water aerobics, walking       Retired Engineer, civil (consulting)   No dietary restrictions, tries to maintain a heart healthy diet   Very pleasant lady   Social Drivers of Corporate investment banker Strain: Low Risk  (12/07/2023)   Overall Financial Resource Strain (CARDIA)   . Difficulty of Paying Living Expenses: Not  hard  at all  Food Insecurity: No Food Insecurity (12/07/2023)   Hunger Vital Sign   . Worried About Programme researcher, broadcasting/film/video in the Last Year: Never true   . Ran Out of Food in the Last Year: Never true  Transportation Needs: No Transportation Needs (12/07/2023)   PRAPARE - Transportation   . Lack of Transportation (Medical): No   . Lack of Transportation (Non-Medical): No  Physical Activity: Inactive (12/07/2023)   Exercise Vital Sign   . Days of Exercise per Week: 0 days   . Minutes of Exercise per Session: 20 min  Stress: No Stress Concern Present (12/07/2023)   [employer] of Occupational Health - Occupational Stress Questionnaire   . Feeling of Stress : Only a little  Recent Concern: Stress - Stress Concern Present (10/20/2023)   [employer] of Occupational Health - Occupational Stress Questionnaire   . Feeling of Stress : To some extent  Social Connections: Socially Integrated (12/07/2023)   Social Connection and Isolation Panel [NHANES]   . Frequency of Communication with Friends and Family: More than three times a week   . Frequency of Social Gatherings with Friends and Family: Twice a week   . Attends Religious Services: More than 4 times per year   . Active Member of Clubs or Organizations: Yes   . Attends Banker Meetings: More than 4 times per year   . Marital Status: Married  Catering manager Violence: Not At Risk (09/12/2023)   Humiliation, Afraid, Rape, and Kick questionnaire   . Fear of Current or Ex-Partner: No   . Emotionally Abused: No   . Physically Abused: No   . Sexually Abused: No    Outpatient Medications Prior to Visit  Medication Sig Dispense Refill  . acetaminophen (TYLENOL) 500 MG tablet Take 1,000 mg by mouth every 6 (six) hours as needed for mild pain or headache.     . albuterol (VENTOLIN HFA) 108 (90 Base) MCG/ACT inhaler Inhale 2 puffs into the lungs every 4 (four) hours as needed for wheezing or shortness of breath (coughing  fits). 18 g 1  . amLODipine (NORVASC) 5 MG tablet Take 5 mg by mouth daily.    Marland Kitchen aspirin EC 81 MG tablet Take 1 tablet (81 mg total) by mouth daily. 90 tablet 3  . atenolol (TENORMIN) 25 MG tablet Take 1 tablet (25 mg total) by mouth 2 (two) times daily. Take 1 tablet by mouth 2 time a day 180 tablet 3  . carboxymethylcellul-glycerin (OPTIVE) 0.5-0.9 % ophthalmic solution Place 1 drop into both eyes daily as needed for dry eyes.    . Cholecalciferol (VITAMIN D3) 50 MCG (2000 UT) CAPS Take 2,000 Units by mouth daily.    . cyanocobalamin (VITAMIN B12) 1000 MCG tablet Take 1,000 mg by mouth daily with breakfast.    . diclofenac Sodium (VOLTAREN) 1 % GEL Apply 2 g topically daily as needed (Back pain).    . DULoxetine (CYMBALTA) 30 MG capsule Take 1 capsule (30 mg total) by mouth daily at 12 noon. 30 capsule 3  . DULoxetine (CYMBALTA) 60 MG capsule Take 1 capsule (60 mg total) by mouth every morning. 90 capsule 1  . EPINEPHrine 0.3 mg/0.3 mL IJ SOAJ injection Inject 0.3 mg into the muscle as needed for anaphylaxis. 2 each 1  . famotidine (PEPCID) 20 MG tablet Take 1 tablet (20 mg total) by mouth 2 (two) times daily as needed for heartburn or indigestion. 60 tablet 1  . FLUoxetine (PROZAC) 20 MG  tablet Take 1 tablet (20 mg total) by mouth daily. 30 tablet 2  . fluticasone (FLONASE) 50 MCG/ACT nasal spray Use 2 spray(s) in each nostril once daily (Patient taking differently: Place 2 sprays into both nostrils daily as needed for allergies or rhinitis.) 16 g 5  . fluticasone (FLOVENT HFA) 110 MCG/ACT inhaler Inhale 2 puffs into the lungs 2 (two) times daily. 1 each 5  . folic acid (FOLVITE) 1 MG tablet Take 1 tablet (1 mg total) by mouth daily.    . furosemide (LASIX) 20 MG tablet TAKE 1 TABLET BY MOUTH AS NEEDED FOR EDEMA 90 tablet 3  . gabapentin (NEURONTIN) 300 MG capsule Take 300-900 mg by mouth See admin instructions. Take 600 mg in the morning 300 mg at lunch time and 900 mg at bedtime    .  hydrOXYzine (ATARAX) 10 MG tablet Take 0.5-2 tablets (5-20 mg total) by mouth 3 (three) times daily as needed for anxiety (not with Loratadine). 90 tablet 2  . loratadine (CLARITIN) 10 MG tablet Take 10 mg by mouth daily as needed for rhinitis.    Marland Kitchen nitroGLYCERIN (NITROSTAT) 0.4 MG SL tablet Place 1 tablet (0.4 mg total) under the tongue every 5 (five) minutes x 3 doses as needed for chest pain. 25 tablet 2  . nystatin cream (MYCOSTATIN) Apply 1 application topically 2 (two) times daily. (Patient taking differently: Apply 1 application  topically daily as needed (vaginal yeast).) 30 g 2  . pantoprazole (PROTONIX) 40 MG tablet Take 40 mg by mouth 2 (two) times daily.    . pentosan polysulfate (ELMIRON) 100 MG capsule Take 1 capsule (100 mg total) by mouth 3 (three) times daily. Reported on 01/05/2016 (Patient taking differently: Take 100 mg by mouth daily as needed (burning and pain). Reported on 01/05/2016) 90 capsule 11  . polyethylene glycol (MIRALAX) 17 g packet Take 17 g by mouth 2 (two) times daily. 14 each 0  . REPATHA SURECLICK 140 MG/ML SOAJ INJECT 140 MG INTO THE SKIN EVERY 14 DAYS 6 mL 0  . sucralfate (CARAFATE) 1 GM/10ML suspension Take 10 mLs (1 g total) by mouth 4 (four) times daily -  with meals and at bedtime. 420 mL 1  . traMADol (ULTRAM) 50 MG tablet Take 1 tablet by mouth 3 (three) times daily as needed.    . traZODone (DESYREL) 150 MG tablet Take 1 tablet (150 mg total) by mouth at bedtime. 30 tablet 3  . Vitamin D, Ergocalciferol, (DRISDOL) 1.25 MG (50000 UNIT) CAPS capsule Take 1 capsule (50,000 Units total) by mouth every 7 (seven) days. 12x Weeks 5 capsule 3   No facility-administered medications prior to visit.    Allergies  Allergen Reactions  . Clindamycin Hcl Shortness Of Breath and Rash  . Penicillins Anaphylaxis  . Rosuvastatin Anaphylaxis  . Baclofen Rash  . Codeine Hives and Nausea Only  . Erythromycin Hives  . Latex Hives  . Lincomycin Other (See Comments)  .  Pentazocine Lactate Other (See Comments)    HALLUCINATION  . Pneumococcal Vaccine Polyvalent Hives, Swelling and Other (See Comments)    REACTION: swelling at injection site  . Prednisone Rash    Other reaction(s): Rash, Hives - can tolerate if she takes with benadryl   . Tamoxifen Nausea And Vomiting  . Haemophilus Influenzae Other (See Comments)    Local reaction at the site   . Haemophilus Influenzae Vaccines Other (See Comments)    Local reaction at site  . Levofloxacin  Other reaction(s): sick  . Other Other (See Comments)    Father had malignant hyperthermia  . Doxycycline Rash  . Fluzone [Influenza Virus Vaccine] Hives    Local reaction at the site    Review of Systems  Constitutional:  Positive for malaise/fatigue. Negative for fever.  HENT:  Negative for congestion.   Eyes:  Negative for blurred vision.  Respiratory:  Negative for shortness of breath.   Cardiovascular:  Negative for chest pain, palpitations and leg swelling.  Gastrointestinal:  Negative for abdominal pain, blood in stool and nausea.  Genitourinary:  Negative for dysuria and frequency.  Musculoskeletal:  Positive for joint pain and myalgias. Negative for falls.  Skin:  Negative for rash.  Neurological:  Negative for dizziness, loss of consciousness and headaches.  Endo/Heme/Allergies:  Negative for environmental allergies.  Psychiatric/Behavioral:  Negative for depression. The patient is nervous/anxious.       Objective:    Physical Exam Constitutional:      General: She is not in acute distress.    Appearance: Normal appearance. She is well-developed. She is not toxic-appearing.  HENT:     Head: Normocephalic and atraumatic.     Right Ear: External ear normal.     Left Ear: External ear normal.     Nose: Nose normal.  Eyes:     General:        Right eye: No discharge.        Left eye: No discharge.     Conjunctiva/sclera: Conjunctivae normal.  Neck:     Thyroid: No thyromegaly.   Cardiovascular:     Rate and Rhythm: Normal rate and regular rhythm.     Heart sounds: Normal heart sounds. No murmur heard. Pulmonary:     Effort: Pulmonary effort is normal. No respiratory distress.     Breath sounds: Normal breath sounds.  Abdominal:     General: Bowel sounds are normal.     Palpations: Abdomen is soft.     Tenderness: There is no abdominal tenderness. There is no guarding.  Musculoskeletal:        General: Normal range of motion.     Cervical back: Neck supple.  Lymphadenopathy:     Cervical: No cervical adenopathy.  Skin:    General: Skin is warm and dry.  Neurological:     Mental Status: She is alert and oriented to person, place, and time.  Psychiatric:        Mood and Affect: Mood normal.        Behavior: Behavior normal.        Thought Content: Thought content normal.        Judgment: Judgment normal.   BP 118/72   Pulse 60   Temp 98.2 F (36.8 C) (Oral)   Resp 16   Ht 5\' 5"  (1.651 m)   Wt 125 lb 9.6 oz (57 kg)   SpO2 99%   BMI 20.90 kg/m  Wt Readings from Last 3 Encounters:  02/18/24 125 lb 9.6 oz (57 kg)  01/29/24 125 lb (56.7 kg)  01/24/24 126 lb (57.2 kg)    Diabetic Foot Exam - Simple   No data filed    Lab Results  Component Value Date   WBC 7.6 01/29/2024   HGB 13.6 01/29/2024   HCT 42.3 01/29/2024   PLT 271 01/29/2024   GLUCOSE 93 01/29/2024   CHOL 131 08/28/2023   TRIG 122.0 08/28/2023   HDL 39.40 08/28/2023   LDLDIRECT 74.0 06/12/2023   LDLCALC 67  08/28/2023   ALT 6 01/29/2024   AST 16 01/29/2024   NA 142 01/29/2024   K 4.1 01/29/2024   CL 103 01/29/2024   CREATININE 1.10 (H) 01/29/2024   BUN 14 01/29/2024   CO2 30 01/29/2024   TSH 1.70 01/11/2024   INR 1.05 08/03/2010   HGBA1C 5.5 05/28/2023   MICROALBUR <0.7 08/08/2023    Lab Results  Component Value Date   TSH 1.70 01/11/2024   Lab Results  Component Value Date   WBC 7.6 01/29/2024   HGB 13.6 01/29/2024   HCT 42.3 01/29/2024   MCV 94.4 01/29/2024    PLT 271 01/29/2024   Lab Results  Component Value Date   NA 142 01/29/2024   K 4.1 01/29/2024   CHLORIDE 102 04/20/2015   CO2 30 01/29/2024   GLUCOSE 93 01/29/2024   BUN 14 01/29/2024   CREATININE 1.10 (H) 01/29/2024   BILITOT 0.4 01/29/2024   ALKPHOS 95 01/29/2024   AST 16 01/29/2024   ALT 6 01/29/2024   PROT 6.5 01/29/2024   ALBUMIN 3.9 01/29/2024   CALCIUM 9.0 01/29/2024   ANIONGAP 9 01/29/2024   EGFR 50.0 08/15/2023   GFR 60.42 01/11/2024   Lab Results  Component Value Date   CHOL 131 08/28/2023   Lab Results  Component Value Date   HDL 39.40 08/28/2023   Lab Results  Component Value Date   LDLCALC 67 08/28/2023   Lab Results  Component Value Date   TRIG 122.0 08/28/2023   Lab Results  Component Value Date   CHOLHDL 3 08/28/2023   Lab Results  Component Value Date   HGBA1C 5.5 05/28/2023       Assessment & Plan:  Arthralgia, unspecified joint Assessment & Plan: Scheduled for ankle arthroscopy in March   Depression with anxiety Assessment & Plan: Struggling with the stress of her illness and debility   Essential hypertension Assessment & Plan: Monitor and report, no changes to meds. Encouraged heart healthy diet such as the DASH diet and exercise as tolerated.     Hyperglycemia Assessment & Plan: hgba1c acceptable, minimize simple carbs. Increase exercise as tolerated.    Stage 3 chronic kidney disease, unspecified whether stage 3a or 3b CKD (HCC) Assessment & Plan: Hydrate and monitor    Statin intolerance Assessment & Plan: Does not tolerate statins   Vitamin B12 deficiency Assessment & Plan: Supplement and monitor   Headache, occipital -     Ambulatory referral to Neurology    Assessment and Plan Assessment & Plan Upper Respiratory Infection Two days of coughing up brown sputum, clear rhinorrhea, mild shortness of breath, low-grade fevers, and chills. No significant chest pain. Discussed the possibility of a viral  etiology and the option to wait and see if symptoms persist or worsen. -Continue supportive care with Mucinex, hydration, and rest. -Contact the office if symptoms worsen or persist by the end of the week.  Seasonal Affective Disorder Discussed the use of Verilux lighting to help manage symptoms. -Consider purchasing and using Verilux lighting.  Leukomoid Reaction Discussed recent oncology visit and PET scan results. No concerns for malignancy noted by oncologist. -Continue current management.  Breast Health Mammogram due soon. Appointment already scheduled. -Attend scheduled mammogram appointment.  Rash Diffuse rash that worsens with heat. Possible heat hives or reaction to a topical product. -Consult with dermatology for evaluation of rash, scalp issue, and spot behind the ear.  Chronic Pain Ongoing pain issues. Upcoming procedures scheduled for back and foot. -Continue current pain management  plan. -Attend scheduled procedures.  Hypertension Blood pressure slightly elevated at home but normalized during the visit. -Continue current management and monitor blood pressure at home.  Depression Patient reports feeling better since starting Fluoxetine 20mg  and Duloxetine 90mg  daily. -Continue current medication regimen.  Follow-up Multiple appointments scheduled for the upcoming months. -Attend scheduled appointments. -Consider canceling every other appointment if feeling significantly better post-surgery.     Danise Edge, MD

## 2024-02-21 DIAGNOSIS — L814 Other melanin hyperpigmentation: Secondary | ICD-10-CM | POA: Diagnosis not present

## 2024-02-21 DIAGNOSIS — L958 Other vasculitis limited to the skin: Secondary | ICD-10-CM | POA: Diagnosis not present

## 2024-02-21 DIAGNOSIS — L570 Actinic keratosis: Secondary | ICD-10-CM | POA: Diagnosis not present

## 2024-02-21 DIAGNOSIS — L578 Other skin changes due to chronic exposure to nonionizing radiation: Secondary | ICD-10-CM | POA: Diagnosis not present

## 2024-02-21 DIAGNOSIS — D225 Melanocytic nevi of trunk: Secondary | ICD-10-CM | POA: Diagnosis not present

## 2024-02-21 DIAGNOSIS — L820 Inflamed seborrheic keratosis: Secondary | ICD-10-CM | POA: Diagnosis not present

## 2024-02-25 ENCOUNTER — Ambulatory Visit: Payer: HMO | Admitting: Professional

## 2024-02-25 ENCOUNTER — Encounter: Payer: Self-pay | Admitting: Professional

## 2024-02-25 DIAGNOSIS — F4310 Post-traumatic stress disorder, unspecified: Secondary | ICD-10-CM

## 2024-02-25 DIAGNOSIS — F331 Major depressive disorder, recurrent, moderate: Secondary | ICD-10-CM

## 2024-02-25 DIAGNOSIS — F411 Generalized anxiety disorder: Secondary | ICD-10-CM | POA: Diagnosis not present
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
## 2024-02-26 DIAGNOSIS — M5416 Radiculopathy, lumbar region: Secondary | ICD-10-CM | POA: Diagnosis not present

## 2024-02-27 ENCOUNTER — Encounter: Payer: Self-pay | Admitting: Family

## 2024-02-28 ENCOUNTER — Other Ambulatory Visit: Payer: Self-pay | Admitting: *Deleted

## 2024-02-28 DIAGNOSIS — E039 Hypothyroidism, unspecified: Secondary | ICD-10-CM

## 2024-02-28 DIAGNOSIS — M797 Fibromyalgia: Secondary | ICD-10-CM

## 2024-02-28 DIAGNOSIS — D500 Iron deficiency anemia secondary to blood loss (chronic): Secondary | ICD-10-CM

## 2024-02-28 DIAGNOSIS — D508 Other iron deficiency anemias: Secondary | ICD-10-CM

## 2024-02-28 DIAGNOSIS — C9000 Multiple myeloma not having achieved remission: Secondary | ICD-10-CM

## 2024-03-03 ENCOUNTER — Telehealth: Payer: Self-pay | Admitting: Hematology & Oncology

## 2024-03-03 ENCOUNTER — Encounter (HOSPITAL_BASED_OUTPATIENT_CLINIC_OR_DEPARTMENT_OTHER): Payer: Self-pay | Admitting: Orthopaedic Surgery

## 2024-03-03 NOTE — Telephone Encounter (Signed)
 Called to schedule lab only appt per inbasket. LVM to return call for scheduling.

## 2024-03-04 ENCOUNTER — Ambulatory Visit (INDEPENDENT_AMBULATORY_CARE_PROVIDER_SITE_OTHER): Payer: HMO

## 2024-03-04 ENCOUNTER — Telehealth: Payer: Self-pay | Admitting: Pharmacy Technician

## 2024-03-04 ENCOUNTER — Other Ambulatory Visit (HOSPITAL_COMMUNITY): Payer: Self-pay

## 2024-03-04 ENCOUNTER — Inpatient Hospital Stay: Attending: Hematology & Oncology

## 2024-03-04 ENCOUNTER — Encounter: Payer: Self-pay | Admitting: Family

## 2024-03-04 VITALS — Ht 64.5 in | Wt 120.0 lb

## 2024-03-04 DIAGNOSIS — D72823 Leukemoid reaction: Secondary | ICD-10-CM | POA: Diagnosis not present

## 2024-03-04 DIAGNOSIS — C9000 Multiple myeloma not having achieved remission: Secondary | ICD-10-CM

## 2024-03-04 DIAGNOSIS — M797 Fibromyalgia: Secondary | ICD-10-CM

## 2024-03-04 DIAGNOSIS — D500 Iron deficiency anemia secondary to blood loss (chronic): Secondary | ICD-10-CM

## 2024-03-04 DIAGNOSIS — R5383 Other fatigue: Secondary | ICD-10-CM | POA: Diagnosis not present

## 2024-03-04 DIAGNOSIS — Z0000 Encounter for general adult medical examination without abnormal findings: Secondary | ICD-10-CM

## 2024-03-04 DIAGNOSIS — D509 Iron deficiency anemia, unspecified: Secondary | ICD-10-CM | POA: Diagnosis not present

## 2024-03-04 DIAGNOSIS — E039 Hypothyroidism, unspecified: Secondary | ICD-10-CM

## 2024-03-04 DIAGNOSIS — D508 Other iron deficiency anemias: Secondary | ICD-10-CM

## 2024-03-04 LAB — CBC WITH DIFFERENTIAL (CANCER CENTER ONLY)
Abs Immature Granulocytes: 0.2 10*3/uL — ABNORMAL HIGH (ref 0.00–0.07)
Basophils Absolute: 0.1 10*3/uL (ref 0.0–0.1)
Basophils Relative: 1 %
Eosinophils Absolute: 0.3 10*3/uL (ref 0.0–0.5)
Eosinophils Relative: 3 %
HCT: 45.5 % (ref 36.0–46.0)
Hemoglobin: 14.3 g/dL (ref 12.0–15.0)
Immature Granulocytes: 2 %
Lymphocytes Relative: 17 %
Lymphs Abs: 1.9 10*3/uL (ref 0.7–4.0)
MCH: 30.4 pg (ref 26.0–34.0)
MCHC: 31.4 g/dL (ref 30.0–36.0)
MCV: 96.6 fL (ref 80.0–100.0)
Monocytes Absolute: 0.9 10*3/uL (ref 0.1–1.0)
Monocytes Relative: 8 %
Neutro Abs: 7.5 10*3/uL (ref 1.7–7.7)
Neutrophils Relative %: 69 %
Platelet Count: 264 10*3/uL (ref 150–400)
RBC: 4.71 MIL/uL (ref 3.87–5.11)
RDW: 14.6 % (ref 11.5–15.5)
WBC Count: 11 10*3/uL — ABNORMAL HIGH (ref 4.0–10.5)
nRBC: 0 % (ref 0.0–0.2)

## 2024-03-04 LAB — RETICULOCYTES
Immature Retic Fract: 15 % (ref 2.3–15.9)
RBC.: 4.82 MIL/uL (ref 3.87–5.11)
Retic Count, Absolute: 81.9 10*3/uL (ref 19.0–186.0)
Retic Ct Pct: 1.7 % (ref 0.4–3.1)

## 2024-03-04 LAB — CMP (CANCER CENTER ONLY)
ALT: 11 U/L (ref 0–44)
AST: 18 U/L (ref 15–41)
Albumin: 4.2 g/dL (ref 3.5–5.0)
Alkaline Phosphatase: 107 U/L (ref 38–126)
Anion gap: 8 (ref 5–15)
BUN: 17 mg/dL (ref 8–23)
CO2: 32 mmol/L (ref 22–32)
Calcium: 9.6 mg/dL (ref 8.9–10.3)
Chloride: 100 mmol/L (ref 98–111)
Creatinine: 1.23 mg/dL — ABNORMAL HIGH (ref 0.44–1.00)
GFR, Estimated: 45 mL/min — ABNORMAL LOW (ref >60)
Glucose, Bld: 104 mg/dL — ABNORMAL HIGH (ref 70–99)
Potassium: 4.6 mmol/L (ref 3.5–5.1)
Sodium: 140 mmol/L (ref 135–145)
Total Bilirubin: 0.7 mg/dL (ref 0.0–1.2)
Total Protein: 6.8 g/dL (ref 6.5–8.1)

## 2024-03-04 LAB — IRON AND IRON BINDING CAPACITY (CC-WL,HP ONLY)
Iron: 98 ug/dL (ref 28–170)
Saturation Ratios: 44 % — ABNORMAL HIGH (ref 10.4–31.8)
TIBC: 225 ug/dL — ABNORMAL LOW (ref 250–450)
UIBC: 127 ug/dL — ABNORMAL LOW (ref 148–442)

## 2024-03-04 LAB — TSH: TSH: 2.595 u[IU]/mL (ref 0.350–4.500)

## 2024-03-04 LAB — FERRITIN: Ferritin: 195 ng/mL (ref 11–307)

## 2024-03-04 MED ORDER — FLUOXETINE HCL 20 MG PO CAPS: 20.0000 mg | ORAL_CAPSULE | Freq: Every day | ORAL | 2 refills | Status: DC

## 2024-03-04 NOTE — Progress Notes (Signed)
 Subjective:   [name] is a 78 y.o. female who presents for Medicare Annual (Subsequent) preventive examination.  Visit Complete: Virtual I connected with  [name] on 03/04/24 by a audio enabled telemedicine application and verified that I am speaking with the correct person using two identifiers.  Patient Location: Home  Provider Location: Home Office  I discussed the limitations of evaluation and management by telemedicine. The patient expressed understanding and agreed to proceed.  Vital Signs: Because this visit was a virtual/telehealth visit, some criteria may be missing or patient reported. Any vitals not documented were not able to be obtained and vitals that have been documented are patient reported.  Patient Medicare AWV questionnaire was completed by the patient on 02/26/24; I have confirmed that all information answered by patient is correct and no changes since this date.  Cardiac Risk Factors include: advanced age (>5men, >30 women);hypertension     Objective:    Today's Vitals   03/04/24 0855  Weight: 120 lb (54.4 kg)  Height: 5' 4.5" (1.638 m)   Body mass index is 20.28 kg/m.     03/04/2024    9:08 AM 02/05/2024   10:24 AM 01/29/2024   11:12 AM 01/07/2024    9:29 AM 12/13/2023    9:39 AM 12/10/2023    2:30 PM 12/07/2023   11:31 AM  Advanced Directives  Does Patient Have a Medical Advance Directive? Yes Yes Yes Yes Yes Yes Yes  Type of Estate agent of [place];Living will Healthcare Power of Attorney Living will;Healthcare Power of Attorney Living will;Healthcare Power of [employer] Power of [employer];Living will Healthcare Power of [place];Living will Healthcare Power of Attorney  Does patient want to make changes to medical advance directive?  No - Patient declined No - Patient declined No - Patient declined No - Patient declined No - Patient declined No - Patient declined  Copy of Healthcare Power of Attorney in  Chart? No - copy requested No - copy requested   Yes - validated most recent copy scanned in chart (See row information) No - copy requested No - copy requested  Would patient like information on creating a medical advance directive?     No - Patient declined No - Patient declined     Current Medications (verified) Outpatient Encounter Medications as of 03/04/2024  Medication Sig   acetaminophen (TYLENOL) 500 MG tablet Take 1,000 mg by mouth every 6 (six) hours as needed for mild pain or headache.    albuterol (VENTOLIN HFA) 108 (90 Base) MCG/ACT inhaler Inhale 2 puffs into the lungs every 4 (four) hours as needed for wheezing or shortness of breath (coughing fits).   amLODipine (NORVASC) 5 MG tablet Take 5 mg by mouth daily.   aspirin EC 81 MG tablet Take 1 tablet (81 mg total) by mouth daily.   atenolol (TENORMIN) 25 MG tablet Take 1 tablet (25 mg total) by mouth 2 (two) times daily. Take 1 tablet by mouth 2 time a day   carboxymethylcellul-glycerin (OPTIVE) 0.5-0.9 % ophthalmic solution Place 1 drop into both eyes daily as needed for dry eyes.   Cholecalciferol (VITAMIN D3) 50 MCG (2000 UT) CAPS Take 2,000 Units by mouth daily.   cyanocobalamin (VITAMIN B12) 1000 MCG tablet Take 1,000 mg by mouth daily with breakfast.   diclofenac Sodium (VOLTAREN) 1 % GEL Apply 2 g topically daily as needed (Back pain).   DULoxetine (CYMBALTA) 30 MG capsule Take 1 capsule (30 mg total) by mouth daily  at 12 noon.   DULoxetine (CYMBALTA) 60 MG capsule Take 1 capsule (60 mg total) by mouth every morning.   EPINEPHrine 0.3 mg/0.3 mL IJ SOAJ injection Inject 0.3 mg into the muscle as needed for anaphylaxis.   famotidine (PEPCID) 20 MG tablet Take 1 tablet (20 mg total) by mouth 2 (two) times daily as needed for heartburn or indigestion.   FLUoxetine (PROZAC) 20 MG tablet Take 1 tablet (20 mg total) by mouth daily. (Patient not taking: Reported on 03/04/2024)   fluticasone (FLONASE) 50 MCG/ACT nasal spray Use 2  spray(s) in each nostril once daily (Patient taking differently: Place 2 sprays into both nostrils daily as needed for allergies or rhinitis.)   fluticasone (FLOVENT HFA) 110 MCG/ACT inhaler Inhale 2 puffs into the lungs 2 (two) times daily.   folic acid (FOLVITE) 1 MG tablet Take 1 tablet (1 mg total) by mouth daily.   furosemide (LASIX) 20 MG tablet TAKE 1 TABLET BY MOUTH AS NEEDED FOR EDEMA   gabapentin (NEURONTIN) 300 MG capsule Take 300-900 mg by mouth See admin instructions. Take 600 mg in the morning 300 mg at lunch time and 900 mg at bedtime   hydrOXYzine (ATARAX) 10 MG tablet Take 0.5-2 tablets (5-20 mg total) by mouth 3 (three) times daily as needed for anxiety (not with Loratadine).   loratadine (CLARITIN) 10 MG tablet Take 10 mg by mouth daily as needed for rhinitis.   nitroGLYCERIN (NITROSTAT) 0.4 MG SL tablet Place 1 tablet (0.4 mg total) under the tongue every 5 (five) minutes x 3 doses as needed for chest pain.   nystatin cream (MYCOSTATIN) Apply 1 application topically 2 (two) times daily. (Patient taking differently: Apply 1 application  topically daily as needed (vaginal yeast).)   pantoprazole (PROTONIX) 40 MG tablet Take 40 mg by mouth 2 (two) times daily.   pentosan polysulfate (ELMIRON) 100 MG capsule Take 1 capsule (100 mg total) by mouth 3 (three) times daily. Reported on 01/05/2016 (Patient taking differently: Take 100 mg by mouth daily as needed (burning and pain). Reported on 01/05/2016)   polyethylene glycol (MIRALAX) 17 g packet Take 17 g by mouth 2 (two) times daily.   REPATHA SURECLICK 140 MG/ML SOAJ INJECT 140 MG INTO THE SKIN EVERY 14 DAYS   sucralfate (CARAFATE) 1 GM/10ML suspension Take 10 mLs (1 g total) by mouth 4 (four) times daily -  with meals and at bedtime.   traMADol (ULTRAM) 50 MG tablet Take 1 tablet by mouth 3 (three) times daily as needed.   traZODone (DESYREL) 150 MG tablet Take 1 tablet (150 mg total) by mouth at bedtime.   Vitamin D, Ergocalciferol,  (DRISDOL) 1.25 MG (50000 UNIT) CAPS capsule Take 1 capsule (50,000 Units total) by mouth every 7 (seven) days. 12x Weeks   No facility-administered encounter medications on file as of 03/04/2024.    Allergies (verified) Clindamycin hcl, Penicillins, Rosuvastatin, Baclofen, Codeine, Erythromycin, Latex, Lincomycin, Pentazocine lactate, Pneumococcal vaccine polyvalent, Prednisone, Tamoxifen, Haemophilus influenzae, Haemophilus influenzae vaccines, Levofloxacin, Other, Doxycycline, and Fluzone [influenza virus vaccine]   History: Past Medical History:  Diagnosis Date   Abdominal pain 10/26/2013   Allergic rhinitis 01/25/2016   ANA positive 07/29/2013   Anemia    Arthralgia 08/18/2013   Arthritis    Asthma, allergic 08/05/2012   Atrial flutter    past history- not current   Atypical chest pain 01/01/2015   Bilateral carpal tunnel syndrome 03/04/2015   CAD in native artery 08/25/2010   Minor CAD 2009, 2011, low  risk Myoview 2013 and 2016        Cataract    bilaterally removed    Cellulitis of breast 02/05/2023   Chronic constipation    Chronic cough 01/09/2017   Chronic interstitial cystitis 02/01/2017   Chronic night sweats 01/08/2015   Common bile duct dilatation 02/13/2023   Concussion    x 3   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   DDD (degenerative disc disease), cervical 09/17/2018   Degenerative scoliosis 04/22/2018   Deviated septum 05/12/2016   Dysphagia    Dyspnea    Dysuria 03/09/2021   Elevated alkaline phosphatase level 01/25/2023   Essential hypertension 08/25/2010   External hemorrhoids 02/05/2015   Facial trauma 01/22/2023   Family history of adverse reaction to anesthesia    Family history of malignant hyperthermia    father had this   Fibrocystic breast disease 10/18/2010   Fibromyalgia    Frequency of urination    Generalized anxiety disorder 08/03/2010   GERD (gastroesophageal reflux disease)    Headache 10/13/2013   High serum vitamin D 05/18/2021    History of chronic bronchitis    History of colonic polyp 02/25/2019   History of hiatal hernia    History of tachycardia    CONTROLLED  WITH ATENOLOL   Hyperglycemia 03/08/2021   Hyperkalemia 09/28/2022   Hyperlipidemia    Intertrigo 12/13/2022   Kidney stone 02/26/2019   Knee pain, right 04/30/2015   Left hip pain 04/30/2015   Leg pain 02/24/2011   Qualifier: Diagnosis of   By: Laury Axon DOMyrene Buddy         Leukocytosis 07/05/2022   Local reaction to hymenoptera sting 05/09/2023   Low back pain 09/08/2020   Major depressive disorder 02/05/2013   Malignant neoplasm of lower-outer quadrant of right breast of female, estrogen receptor positive 01/03/2013   Nasal turbinate hypertrophy 05/12/2016   Oral herpes 02/05/2023   OSA and COPD overlap syndrome 06/10/2020   Osteoporosis 01/2019   T score -2.2 stable/improved from prior study   Pelvic pain    Peripheral neuropathy 05/22/2014   Personal history of radiation therapy    Pneumonia    Primary insomnia 06/25/2017   Pulmonary nodules 02/12/2015   Rash and other nonspecific skin eruption 05/09/2023   Recurrent falls 05/02/2020   Recurrent sinusitis 02/25/2019   Right arm pain 07/20/2016   RLS (restless legs syndrome) 11/09/2019   RUQ pain 07/05/2022   S/P radiation therapy 11/12/12 - 12/05/12   right Breast   Sciatica 04/30/2015   Sepsis 2014   from UTI    Sjogren's disease 12/12/2021   Splenic lesion 09/02/2012   Sprain of lateral ligament of ankle joint 07/31/2023   Stage 3 chronic kidney disease    Statin intolerance 02/29/2020   Stricture and stenosis of esophagus 10/19/2011   Tongue lesion 02/05/2023   Tremor 07/05/2022   Urgency of urination    Vertigo 01/22/2023   Vitamin B12 deficiency 03/08/2021   Vocal cord cyst 05/18/2021   Past Surgical History:  Procedure Laterality Date   24 HOUR PH STUDY N/A 04/03/2016   Procedure: 24 HOUR PH STUDY;  Surgeon: Ruffin Frederick, MD;  Location: [facility];  Service:  Gastroenterology;  Laterality: N/A;   BREAST BIOPSY Right 08/23/2012   ADH   BREAST BIOPSY Right 10/11/2012   Ductal Carcinoma   BREAST EXCISIONAL BIOPSY Right 09/04/2013   benign   BREAST LUMPECTOMY Right 10/11/2012   W/ SLN BX   CARDIAC CATHETERIZATION  09-13-2007  DR  STUCKEY   WELL-PRESERVED LVF/  DIFFUSE SCATTERED CORONARY CALCIFACATION AND ATHEROSCLEROSIS WITHOUT OBSTRUCTION   CARDIAC CATHETERIZATION  08-04-2010  DR Clifton James   NON-OBSTRUCTIVE CAD/  pLAD 40%/  oLAD 30%/  mLAD 30%/  pRCA 30%/  EF 60%   CARDIOVASCULAR STRESS TEST  06-18-2012  DR McALHANY   LOW RISK NUCLEAR STUDY/  SMALL FIXED AREA OF MODERATELY DECREASED UPTAKE IN ANTEROSEPTAL WALL WHICH MAY BE ARTIFACTUAL/  NO ISCHEMIA/  EF 68%   COLONOSCOPY  09/29/2010   CYSTOSCOPY     CYSTOSCOPY WITH HYDRODISTENSION AND BIOPSY N/A 03/06/2014   Procedure: CYSTOSCOPY/HYDRODISTENSION/ INSTILATION OF MARCAINE AND PYRIDIUM;  Surgeon: Kathi Ludwig, MD;  Location: [facility];  Service: Urology;  Laterality: N/A;   CYSTOSCOPY/URETEROSCOPY/HOLMIUM LASER/STENT PLACEMENT Left 03/21/2023   Procedure: CYSTOSCOPY LEFT RETROGRADE PYELOGRAM, LEFT URETEROSCOPY, HOLMIUM LASER LITHOTRIPSYM, AND LEFT URETERAL STENT PLACEMENT;  Surgeon: Rene Paci, MD;  Location: [facility];  Service: Urology;  Laterality: Left;  60 MINUTES   ESOPHAGEAL MANOMETRY N/A 04/03/2016   Procedure: ESOPHAGEAL MANOMETRY (EM);  Surgeon: Ruffin Frederick, MD;  Location: [facility];  Service: Gastroenterology;  Laterality: N/A;   EXTRACORPOREAL SHOCK WAVE LITHOTRIPSY Left 02/06/2019   Procedure: EXTRACORPOREAL SHOCK WAVE LITHOTRIPSY (ESWL);  Surgeon: Ihor Gully, MD;  Location: [facility];  Service: Urology;  Laterality: Left;   NASAL SINUS SURGERY  1985   ORIF RIGHT ANKLE  FX  2006   POLYPECTOMY     REMOVAL VOCAL CORD CYST  01/2013   RIGHT BREAST BX  08/23/2012   RIGHT HAND SURGERY  X3  LAST ONE 2009   INCLUDES  ORIF RIGHT 5TH FINGER AND  REVISION TWICE   SKIN CANCER EXCISION     face,arms   TONSILLECTOMY AND ADENOIDECTOMY  AGE 55   TOTAL ABDOMINAL HYSTERECTOMY W/ BILATERAL SALPINGOOPHORECTOMY  1982   W/  APPENDECTOMY   TRANSTHORACIC ECHOCARDIOGRAM  06/24/2012   GRADE I DIASTOLIC DYSFUNCTION/  EF 55-60%/  MILD MR   UPPER GASTROINTESTINAL ENDOSCOPY     Family History  Problem Relation Age of Onset   Rectal cancer Mother    Colon cancer Mother    Pancreatic cancer Mother    Diabetes Mother    Breast cancer Mother 56   Breast cancer Maternal Aunt        breast   Birth defects Maternal Aunt    Irritable bowel syndrome Son    Heart disease Son        CAD, MV replacement   Allergic Disorder Daughter    Diabetes Daughter    Colon cancer Father    Colon polyps Father    Diabetes Father    Stroke Father    Heart disease Father        CHF   Hyperlipidemia Father    Hypertension Father    Arthritis Father    Breast cancer Paternal Aunt    Breast cancer Paternal Aunt    Arthritis Paternal Uncle    Allergic Disorder Daughter    Heart disease Cousin        CAD, had a blood clot after stenting   Esophageal cancer Neg Hx    Stomach cancer Neg Hx    Social History   Socioeconomic History   Marital status: Married    Spouse name: Windy Fast    Number of children: 3   Years of education: 16   Highest education level: Bachelor's degree (e.g., BA, AB, BS)  Occupational History   Occupation: Retired    Comment:  RN  Tobacco Use   Smoking status: Former    Current packs/day: 0.00    Average packs/day: 1 pack/day for 15.0 years (15.0 ttl pk-yrs)    Types: Cigarettes    Start date: 05/27/1990    Quit date: 05/27/2005    Years since quitting: 18.7    Passive exposure: Never   Smokeless tobacco: Never  Vaping Use   Vaping status: Never Used  Substance and Sexual Activity   Alcohol use: No    Alcohol/week: 0.0 standard drinks of alcohol   Drug use: No   Sexual activity: Not Currently    Partners: Male    Birth  control/protection: Surgical    Comment: 1st intercourse 78 yo-Fewer than 5 partners, hysterectomy  Other Topics Concern   Not on file  Social History Narrative   Lives with husband.   Caffeine use: 1/2 cup per day   Exercise-- 2days a week [employer],  Water aerobics, walking       Retired Engineer, civil (consulting)   No dietary restrictions, tries to maintain a heart healthy diet   Very pleasant lady   Social Drivers of Corporate investment banker Strain: Low Risk  (03/04/2024)   Overall Financial Resource Strain (CARDIA)    Difficulty of Paying Living Expenses: Not hard at all  Food Insecurity: No Food Insecurity (03/04/2024)   Hunger Vital Sign    Worried About Running Out of Food in the Last Year: Never true    Ran Out of Food in the Last Year: Never true  Transportation Needs: No Transportation Needs (03/04/2024)   PRAPARE - Administrator, Civil Service (Medical): No    Lack of Transportation (Non-Medical): No  Physical Activity: Insufficiently Active (03/04/2024)   Exercise Vital Sign    Days of Exercise per Week: 3 days    Minutes of Exercise per Session: 20 min  Stress: No Stress Concern Present (03/04/2024)   [employer] of Occupational Health - Occupational Stress Questionnaire    Feeling of Stress : Not at all  Social Connections: Socially Integrated (03/04/2024)   Social Connection and Isolation Panel [NHANES]    Frequency of Communication with Friends and Family: More than three times a week    Frequency of Social Gatherings with Friends and Family: More than three times a week    Attends Religious Services: More than 4 times per year    Active Member of [employer] or Organizations: Yes    Attends Engineer, structural: More than 4 times per year    Marital Status: Married    Tobacco Counseling Counseling given: Not Answered   Clinical Intake:  Pre-visit preparation completed: Yes  Pain : No/denies pain     BMI - recorded: 20.28 Nutritional Status: BMI of  19-24  Normal Nutritional Risks: None Diabetes: No  How often do you need to have someone help you when you read instructions, pamphlets, or other written materials from your doctor or pharmacy?: 1 - Never  Interpreter Needed?: No  Information entered by :: Theresa Mulligan LPN   Activities of Daily Living    03/04/2024    9:03 AM 02/26/2024    2:14 PM  In your present state of health, do you have any difficulty performing the following activities:  Hearing? 0 0  Vision? 1 1  Comment Pending appt with Dr Romeo Apple   Difficulty concentrating or making decisions? 0 1  Walking or climbing stairs? 1 1  Dressing or bathing? 0 0  Doing errands, shopping?  1 1  Comment Uses a Sales executive and eating ? N Y  Using the Toilet? N Y  In the past six months, have you accidently leaked urine? N N  Do you have problems with loss of bowel control? N N  Managing your Medications? N N  Managing your Finances? N Y  Housekeeping or managing your Housekeeping? N Y    Patient Care Team: Bradd Canary, MD as PCP - General (Family Medicine) Jens Som Madolyn Frieze, MD as PCP - Cardiology (Cardiology) Cherlyn Roberts, MD as Consulting Physician (Dermatology) Leslye Peer, MD as Consulting Physician (Pulmonary Disease) Magrinat, Valentino Hue, MD (Inactive) as Consulting Physician (Oncology) Jens Som Madolyn Frieze, MD as Consulting Physician (Cardiology) Armbruster, Willaim Rayas, MD as Consulting Physician (Gastroenterology) Sheran Luz, MD as Consulting Physician (Physical Medicine and Rehabilitation) Fontaine, Nadyne Coombes, MD (Inactive) as Consulting Physician (Gynecology) Osborn Coho, MD (Inactive) as Consulting Physician (Otolaryngology) Durene Romans, MD as Consulting Physician (Orthopedic Surgery) Ihor Gully, MD (Inactive) as Consulting Physician (Urology) Rossie Muskrat, MD (Rheumatology)  Indicate any recent Medical Services you may have received from other than [facility] providers in the  past year (date may be approximate).     Assessment:   This is a routine wellness examination for [place].  Hearing/Vision screen Hearing Screening - Comments:: Denies hearing difficulties   Vision Screening - Comments:: Wears rx glasses - up to date with routine eye exams with  Dr Starling Manns   Goals Addressed               This Visit's Progress     Increase physical activity (pt-stated)        Stay active.       Depression Screen    03/04/2024    9:02 AM 01/11/2024    8:59 AM 09/20/2023   10:52 AM 09/04/2023   12:19 PM 08/28/2023    9:34 AM 06/19/2023   10:53 AM 05/28/2023    9:44 AM  PHQ 2/9 Scores  PHQ - 2 Score 0 2 0  0 1 0  PHQ- 9 Score 0 4    8 0     Information is confidential and restricted. Go to Review Flowsheets to unlock data.    Fall Risk    03/04/2024    9:05 AM 02/26/2024    2:14 PM 02/05/2024   10:23 AM 12/07/2023   11:30 AM 09/20/2023   10:52 AM  Fall Risk   Falls in the past year? 1 1 0 0 0  Number falls in past yr: 0 1 0 0 0  Injury with Fall? 1 1 0 0 0  Comment Left foot injury. Followed by medical attention      Risk for fall due to : Impaired balance/gait;History of fall(s)      Follow up Falls prevention discussed;Falls evaluation completed  Falls evaluation completed Falls evaluation completed Falls evaluation completed    MEDICARE RISK AT HOME: Medicare Risk at Home Any stairs in or around the home?: Yes If so, are there any without handrails?: Yes Home free of loose throw rugs in walkways, pet beds, electrical cords, etc?: Yes Adequate lighting in your home to reduce risk of falls?: Yes Life alert?: No Use of a cane, walker or w/c?: Yes Grab bars in the bathroom?: No Shower chair or bench in shower?: No Elevated toilet seat or a handicapped toilet?: No  TIMED UP AND GO:  Was the test performed?  No    Cognitive Function:  08/03/2023   12:00 PM 07/26/2016   10:10 AM  MMSE - Mini Mental State Exam  Orientation to time 5 4   Orientation to Place 5 4  Registration 3 3  Attention/ Calculation 5 5  Recall 3 2  Language- name 2 objects 2 2  Language- repeat 1 1  Language- follow 3 step command 3 2  Language- read & follow direction 1 1  Write a sentence 1 1  Copy design 0 0  Total score 29 25      05/30/2023    5:00 PM  Montreal Cognitive Assessment   Visuospatial/ Executive (0/5) 3  Naming (0/3) 2  Attention: Read list of digits (0/2) 2  Attention: Read list of letters (0/1) 1  Attention: Serial 7 subtraction starting at 100 (0/3) 3  Language: Repeat phrase (0/2) 2  Language : Fluency (0/1) 1  Abstraction (0/2) 1  Delayed Recall (0/5) 4  Orientation (0/6) 6  Total 25  Adjusted Score (based on education) 25      03/04/2024    9:08 AM 03/05/2023   10:41 AM 02/28/2022    9:51 AM  6CIT Screen  What Year? 0 points 0 points 0 points  What month? 0 points 0 points 0 points  What time? 0 points 0 points 0 points  Count back from 20 0 points 0 points 0 points  Months in reverse 0 points 0 points 0 points  Repeat phrase 0 points 0 points 0 points  Total Score 0 points 0 points 0 points    Immunizations Immunization History  Administered Date(s) Administered   Influenza Split 10/10/2011, 09/24/2012   Influenza Whole 10/18/2010   Influenza,inj,Quad PF,6+ Mos 10/13/2013, 09/25/2014   PFIZER(Purple Top)SARS-COV-2 Vaccination 01/29/2020, 02/23/2020, 09/18/2020, 07/25/2021   Pneumococcal Polysaccharide-23 01/27/2011   Tdap 10/21/2013, 06/01/2018   Zoster Recombinant(Shingrix) 07/14/2019, 10/16/2019    TDAP status: Up to date      Covid-19 vaccine status: Declined, Education has been provided regarding the importance of this vaccine but patient still declined. Advised may receive this vaccine at local pharmacy or Health Dept.or vaccine clinic. Aware to provide a copy of the vaccination record if obtained from local pharmacy or Health Dept. Verbalized acceptance and understanding.  Qualifies for  Shingles Vaccine? Yes   Zostavax completed Yes   Shingrix Completed?: Yes  Screening Tests Health Maintenance  Topic Date Due   MAMMOGRAM  08/26/2023   COVID-19 Vaccine (5 - 2024-25 season) 08/26/2023   HEMOGLOBIN A1C  11/27/2023   Diabetic kidney evaluation - Urine ACR  08/07/2024   Diabetic kidney evaluation - eGFR measurement  01/28/2025   Medicare Annual Wellness (AWV)  03/04/2025   DTaP/Tdap/Td (3 - Td or Tdap) 06/01/2028   DEXA SCAN  Completed   Hepatitis C Screening  Completed   Zoster Vaccines- Shingrix  Completed   HPV VACCINES  Aged Out   Pneumonia Vaccine 60+ Years old  Discontinued   FOOT EXAM  Discontinued   OPHTHALMOLOGY EXAM  Discontinued   Colonoscopy  Discontinued    Health Maintenance  Health Maintenance Due  Topic Date Due   MAMMOGRAM  08/26/2023   COVID-19 Vaccine (5 - 2024-25 season) 08/26/2023   HEMOGLOBIN A1C  11/27/2023      Mammogram status: Ordered scheduled for 03/06/24. Pt provided with contact info and advised to call to schedule appt.   Bone Density status: Completed 04/06/21. Results reflect: Bone density results: OSTEOPOROSIS. Repeat every   years.      Additional Screening:  Hepatitis C Screening: does qualify; Completed 07/15/19  Vision Screening: Recommended annual ophthalmology exams for early detection of glaucoma and other disorders of the eye. Is the patient up to date with their annual eye exam?  Yes  Who is the provider or what is the name of the office in which the patient attends annual eye exams? Dr Romeo Apple If pt is not established with a provider, would they like to be referred to a provider to establish care? No .   Dental Screening: Recommended annual dental exams for proper oral hygiene    Community Resource Referral / Chronic Care Management:  CRR required this visit?  No   CCM required this visit?  No     Plan:     I have personally reviewed and noted the following in the patient's chart:   Medical  and social history Use of alcohol, tobacco or illicit drugs  Current medications and supplements including opioid prescriptions. Patient is not currently taking opioid prescriptions. Functional ability and status Nutritional status Physical activity Advanced directives List of other physicians Hospitalizations, surgeries, and ER visits in previous 12 months Vitals Screenings to include cognitive, depression, and falls Referrals and appointments  In addition, I have reviewed and discussed with patient certain preventive protocols, quality metrics, and best practice recommendations. A written personalized care plan for preventive services as well as general preventive health recommendations were provided to patient.     Tillie Rung, LPN   [id number]/[id number]   After Visit Summary: (MyChart) Due to this being a telephonic visit, the after visit summary with patients personalized plan was offered to patient via MyChart   Nurse Notes: None

## 2024-03-04 NOTE — Addendum Note (Signed)
 Addended by: Thelma Barge D on: 03/04/2024 02:12 PM   Modules accepted: Orders

## 2024-03-04 NOTE — Telephone Encounter (Signed)
Capsules sent to pharmacy

## 2024-03-04 NOTE — Telephone Encounter (Signed)
 Pharmacy Patient Advocate Encounter   Received notification from CoverMyMeds that prior authorization for FLUOXETINE 20MG  TABLETS is required/requested. HOWEVER IF YOU CHANGE THE ORDER TO FLUOXETINE 20MG  CAPSULES HER PLAN WILL PAY FOR THAT AND HER COPAY WAS $0.00   Insurance verification completed.   The patient is insured through [facility] ADVANTAGE/RX ADVANCE .   Per test claim:  FLUOXETINE 20MG  CAPSULES is preferred by the insurance.  If suggested medication is appropriate, Please send in a new RX and discontinue this one. If not, please advise as to why it's not appropriate so that we may request a Prior Authorization. Please note, some preferred medications may still require a PA.  If the suggested medications have not been trialed and there are no contraindications to their use, the PA will not be submitted, as it will not be approved.

## 2024-03-04 NOTE — Patient Instructions (Addendum)
 [name] , Thank you for taking time to come for your Medicare Wellness Visit. I appreciate your ongoing commitment to your health goals. Please review the following plan we discussed and let me know if I can assist you in the future.   Referrals/Orders/Follow-Ups/Clinician Recommendations:   This is a list of the screening recommended for you and due dates:  Health Maintenance  Topic Date Due   Mammogram  08/26/2023   COVID-19 Vaccine (5 - 2024-25 season) 08/26/2023   Hemoglobin A1C  11/27/2023   Yearly kidney health urinalysis for diabetes  08/07/2024   Yearly kidney function blood test for diabetes  01/28/2025   Medicare Annual Wellness Visit  03/04/2025   DTaP/Tdap/Td vaccine (3 - Td or Tdap) 06/01/2028   DEXA scan (bone density measurement)  Completed   Hepatitis C Screening  Completed   Zoster (Shingles) Vaccine  Completed   HPV Vaccine  Aged Out   Pneumonia Vaccine  Discontinued   Complete foot exam   Discontinued   Eye exam for diabetics  Discontinued   Colon Cancer Screening  Discontinued    Advanced directives: (Copy Requested) Please bring a copy of your health care power of attorney and living will to the office to be added to your chart at your convenience. You can mail to [facility] [address] [place], [place] [address] or email to [email or web address]  Next Medicare Annual Wellness Visit scheduled for next year: Yes

## 2024-03-05 ENCOUNTER — Encounter: Payer: Self-pay | Admitting: *Deleted

## 2024-03-05 DIAGNOSIS — M545 Low back pain, unspecified: Secondary | ICD-10-CM | POA: Diagnosis not present

## 2024-03-05 DIAGNOSIS — N941 Unspecified dyspareunia: Secondary | ICD-10-CM | POA: Diagnosis not present

## 2024-03-05 DIAGNOSIS — R102 Pelvic and perineal pain: Secondary | ICD-10-CM | POA: Diagnosis not present

## 2024-03-06 ENCOUNTER — Other Ambulatory Visit (HOSPITAL_COMMUNITY): Payer: Self-pay

## 2024-03-06 ENCOUNTER — Ambulatory Visit
Admission: RE | Admit: 2024-03-06 | Discharge: 2024-03-06 | Disposition: A | Discharge status: Home / Self Care | Payer: HMO | Source: Ambulatory Visit | Attending: Family Medicine | Admitting: Family Medicine

## 2024-03-06 ENCOUNTER — Encounter (HOSPITAL_BASED_OUTPATIENT_CLINIC_OR_DEPARTMENT_OTHER): Payer: Self-pay | Admitting: Orthopaedic Surgery

## 2024-03-06 ENCOUNTER — Other Ambulatory Visit: Payer: Self-pay | Admitting: Family Medicine

## 2024-03-06 DIAGNOSIS — N644 Mastodynia: Secondary | ICD-10-CM | POA: Diagnosis not present

## 2024-03-06 DIAGNOSIS — R92343 Mammographic extreme density, bilateral breasts: Secondary | ICD-10-CM | POA: Diagnosis not present

## 2024-03-06 DIAGNOSIS — Z1231 Encounter for screening mammogram for malignant neoplasm of breast: Secondary | ICD-10-CM

## 2024-03-06 DIAGNOSIS — N6310 Unspecified lump in the right breast, unspecified quadrant: Secondary | ICD-10-CM

## 2024-03-06 MED ORDER — TRAZODONE HCL 150 MG PO TABS: 150.0000 mg | ORAL_TABLET | Freq: Every day | ORAL | 3 refills | Status: DC

## 2024-03-06 NOTE — Progress Notes (Signed)
 Patients case must be a first case start due to MH family hx. Reviewed with Dr. Bradley Ferris. LVM with Megan at Dr. Emelda Brothers office

## 2024-03-07 NOTE — H&P (Signed)
 ORTHOPAEDIC SURGERY H&P  Subjective:  The patient presents with left ankle instability.   Past Medical History:  Diagnosis Date   Abdominal pain 10/26/2013   Allergic rhinitis 01/25/2016   ANA positive 07/29/2013   Anemia    Arthralgia 08/18/2013   Arthritis    Asthma, allergic 08/05/2012   Atrial flutter    past history- not current   Atypical chest pain 01/01/2015   Bilateral carpal tunnel syndrome 03/04/2015   CAD in native artery 08/25/2010   Minor CAD 2009, 2011, low risk Myoview 2013 and 2016        Cataract    bilaterally removed    Cellulitis of breast 02/05/2023   Chronic constipation    Chronic cough 01/09/2017   Chronic interstitial cystitis 02/01/2017   Chronic night sweats 01/08/2015   Common bile duct dilatation 02/13/2023   Concussion    x 3   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   DDD (degenerative disc disease), cervical 09/17/2018   Degenerative scoliosis 04/22/2018   Deviated septum 05/12/2016   Dysphagia    Dyspnea    Dysuria 03/09/2021   Elevated alkaline phosphatase level 01/25/2023   Essential hypertension 08/25/2010   External hemorrhoids 02/05/2015   Facial trauma 01/22/2023   Family history of adverse reaction to anesthesia    Family history of malignant hyperthermia    father had this   Fibrocystic breast disease 10/18/2010   Fibromyalgia    Frequency of urination    Generalized anxiety disorder 08/03/2010   GERD (gastroesophageal reflux disease)    Headache 10/13/2013   High serum vitamin D 05/18/2021   History of chronic bronchitis    History of colonic polyp 02/25/2019   History of hiatal hernia    History of tachycardia    CONTROLLED  WITH ATENOLOL   Hyperglycemia 03/08/2021   Hyperkalemia 09/28/2022   Hyperlipidemia    Intertrigo 12/13/2022   Kidney stone 02/26/2019   Knee pain, right 04/30/2015   Left hip pain 04/30/2015   Leg pain 02/24/2011   Qualifier: Diagnosis of   By: Laury Axon DOMyrene Buddy         Leukocytosis  07/05/2022   Local reaction to hymenoptera sting 05/09/2023   Low back pain 09/08/2020   Major depressive disorder 02/05/2013   Malignant neoplasm of lower-outer quadrant of right breast of female, estrogen receptor positive 01/03/2013   Nasal turbinate hypertrophy 05/12/2016   Oral herpes 02/05/2023   OSA and COPD overlap syndrome 06/10/2020   Osteoporosis 01/2019   T score -2.2 stable/improved from prior study   Pelvic pain    Peripheral neuropathy 05/22/2014   Personal history of radiation therapy    Pneumonia    Primary insomnia 06/25/2017   Pulmonary nodules 02/12/2015   Rash and other nonspecific skin eruption 05/09/2023   Recurrent falls 05/02/2020   Recurrent sinusitis 02/25/2019   Right arm pain 07/20/2016   RLS (restless legs syndrome) 11/09/2019   RUQ pain 07/05/2022   S/P radiation therapy 11/12/12 - 12/05/12   right Breast   Sciatica 04/30/2015   Sepsis 2014   from UTI    Sjogren's disease 12/12/2021   Splenic lesion 09/02/2012   Sprain of lateral ligament of ankle joint 07/31/2023   Stage 3 chronic kidney disease    Statin intolerance 02/29/2020   Stricture and stenosis of esophagus 10/19/2011   Tongue lesion 02/05/2023   Tremor 07/05/2022   Urgency of urination    Vertigo 01/22/2023   Vitamin B12 deficiency 03/08/2021   Vocal cord  cyst 05/18/2021    Past Surgical History:  Procedure Laterality Date   49 HOUR PH STUDY N/A 04/03/2016   Procedure: 24 HOUR PH STUDY;  Surgeon: Ruffin Frederick, MD;  Location: [facility];  Service: Gastroenterology;  Laterality: N/A;   BREAST BIOPSY Right 08/23/2012   ADH   BREAST BIOPSY Right 10/11/2012   Ductal Carcinoma   BREAST EXCISIONAL BIOPSY Right 09/04/2013   benign   BREAST LUMPECTOMY Right 10/11/2012   W/ SLN BX   CARDIAC CATHETERIZATION  09-13-2007  DR Riley Kill   WELL-PRESERVED LVF/  DIFFUSE SCATTERED CORONARY CALCIFACATION AND ATHEROSCLEROSIS WITHOUT OBSTRUCTION   CARDIAC CATHETERIZATION  08-04-2010   DR MCALHANY   NON-OBSTRUCTIVE CAD/  pLAD 40%/  oLAD 30%/  mLAD 30%/  pRCA 30%/  EF 60%   CARDIOVASCULAR STRESS TEST  06-18-2012  DR McALHANY   LOW RISK NUCLEAR STUDY/  SMALL FIXED AREA OF MODERATELY DECREASED UPTAKE IN ANTEROSEPTAL WALL WHICH MAY BE ARTIFACTUAL/  NO ISCHEMIA/  EF 68%   COLONOSCOPY  09/29/2010   CYSTOSCOPY     CYSTOSCOPY WITH HYDRODISTENSION AND BIOPSY N/A 03/06/2014   Procedure: CYSTOSCOPY/HYDRODISTENSION/ INSTILATION OF MARCAINE AND PYRIDIUM;  Surgeon: Kathi Ludwig, MD;  Location: [facility];  Service: Urology;  Laterality: N/A;   CYSTOSCOPY/URETEROSCOPY/HOLMIUM LASER/STENT PLACEMENT Left 03/21/2023   Procedure: CYSTOSCOPY LEFT RETROGRADE PYELOGRAM, LEFT URETEROSCOPY, HOLMIUM LASER LITHOTRIPSYM, AND LEFT URETERAL STENT PLACEMENT;  Surgeon: Rene Paci, MD;  Location: [facility];  Service: Urology;  Laterality: Left;  60 MINUTES   ESOPHAGEAL MANOMETRY N/A 04/03/2016   Procedure: ESOPHAGEAL MANOMETRY (EM);  Surgeon: Ruffin Frederick, MD;  Location: [facility];  Service: Gastroenterology;  Laterality: N/A;   EXTRACORPOREAL SHOCK WAVE LITHOTRIPSY Left 02/06/2019   Procedure: EXTRACORPOREAL SHOCK WAVE LITHOTRIPSY (ESWL);  Surgeon: Ihor Gully, MD;  Location: [facility];  Service: Urology;  Laterality: Left;   NASAL SINUS SURGERY  1985   ORIF RIGHT ANKLE  FX  2006   POLYPECTOMY     REMOVAL VOCAL CORD CYST  01/2013   RIGHT BREAST BX  08/23/2012   RIGHT HAND SURGERY  X3  LAST ONE 2009   INCLUDES  ORIF RIGHT 5TH FINGER AND REVISION TWICE   SKIN CANCER EXCISION     face,arms   TONSILLECTOMY AND ADENOIDECTOMY  AGE 26   TOTAL ABDOMINAL HYSTERECTOMY W/ BILATERAL SALPINGOOPHORECTOMY  1982   W/  APPENDECTOMY   TRANSTHORACIC ECHOCARDIOGRAM  06/24/2012   GRADE I DIASTOLIC DYSFUNCTION/  EF 55-60%/  MILD MR   UPPER GASTROINTESTINAL ENDOSCOPY       (Not in an outpatient encounter)    Allergies  Allergen Reactions   Clindamycin Hcl Shortness Of  Breath and Rash   Penicillins Anaphylaxis   Rosuvastatin Anaphylaxis   Baclofen Rash   Codeine Hives and Nausea Only   Erythromycin Hives   Latex Hives   Lincomycin Other (See Comments)   Pentazocine Lactate Other (See Comments)    HALLUCINATION   Pneumococcal Vaccine Polyvalent Hives, Swelling and Other (See Comments)    REACTION: swelling at injection site   Prednisone Rash    Other reaction(s): Rash, Hives - can tolerate if she takes with benadryl    Tamoxifen Nausea And Vomiting   Haemophilus Influenzae Other (See Comments)    Local reaction at the site    Haemophilus Influenzae Vaccines Other (See Comments)    Local reaction at site   Levofloxacin     Other reaction(s): sick   Other Other (See Comments)  Father had malignant hyperthermia   Doxycycline Rash   Fluzone [Influenza Virus Vaccine] Hives    Local reaction at the site    Social History   Socioeconomic History   Marital status: Married    Spouse name: Windy Fast    Number of children: 3   Years of education: 16   Highest education level: Bachelor's degree (e.g., BA, AB, BS)  Occupational History   Occupation: Retired    Comment: Charity fundraiser  Tobacco Use   Smoking status: Former    Current packs/day: 0.00    Average packs/day: 1 pack/day for 15.0 years (15.0 ttl pk-yrs)    Types: Cigarettes    Start date: 05/27/1990    Quit date: 05/27/2005    Years since quitting: 18.7    Passive exposure: Never   Smokeless tobacco: Never  Vaping Use   Vaping status: Never Used  Substance and Sexual Activity   Alcohol use: No    Alcohol/week: 0.0 standard drinks of alcohol   Drug use: No   Sexual activity: Not Currently    Partners: Male    Birth control/protection: Surgical    Comment: 1st intercourse 78 yo-Fewer than 5 partners, hysterectomy  Other Topics Concern   Not on file  Social History Narrative   Lives with husband.   Caffeine use: 1/2 cup per day   Exercise-- 2days a week [employer],  Water aerobics, walking        Retired Engineer, civil (consulting)   No dietary restrictions, tries to maintain a heart healthy diet   Very pleasant lady   Social Drivers of Corporate investment banker Strain: Low Risk  (03/04/2024)   Overall Financial Resource Strain (CARDIA)    Difficulty of Paying Living Expenses: Not hard at all  Food Insecurity: No Food Insecurity (03/04/2024)   Hunger Vital Sign    Worried About Running Out of Food in the Last Year: Never true    Ran Out of Food in the Last Year: Never true  Transportation Needs: No Transportation Needs (03/04/2024)   PRAPARE - Administrator, Civil Service (Medical): No    Lack of Transportation (Non-Medical): No  Physical Activity: Insufficiently Active (03/04/2024)   Exercise Vital Sign    Days of Exercise per Week: 3 days    Minutes of Exercise per Session: 20 min  Stress: No Stress Concern Present (03/04/2024)   [employer] of Occupational Health - Occupational Stress Questionnaire    Feeling of Stress : Not at all  Social Connections: Socially Integrated (03/04/2024)   Social Connection and Isolation Panel [NHANES]    Frequency of Communication with Friends and Family: More than three times a week    Frequency of Social Gatherings with Friends and Family: More than three times a week    Attends Religious Services: More than 4 times per year    Active Member of [employer] or Organizations: Yes    Attends Banker Meetings: More than 4 times per year    Marital Status: Married  Catering manager Violence: Not At Risk (03/04/2024)   Humiliation, Afraid, Rape, and Kick questionnaire    Fear of Current or Ex-Partner: No    Emotionally Abused: No    Physically Abused: No    Sexually Abused: No     History reviewed. No pertinent family history.   Review of Systems Pertinent items are noted in HPI.  Objective: Vital signs in last 24 hours:    03/06/2024    8:58 AM 03/04/2024  8:55 AM 02/18/2024    1:54 PM  Vitals with BMI  Height 5' 4.5" 5' 4.5"    Weight 120 lbs 120 lbs   BMI 20.29 20.29   Systolic  -- 118  Diastolic  -- 72      EXAM: General: Well nourished, well developed. Awake, alert and oriented to time, place, person. Normal mood and affect. No apparent distress. Breathing room air.  Operative Lower Extremity: Alignment - Neutral Deformity - None Skin intact Tenderness to palpation - left ankle 5/5 TA, PT, GS, Per, EHL, FHL Sensation intact to light touch throughout Palpable DP and PT pulses Special testing: None  The contralateral foot/ankle was examined for comparison and noted to be neurovascularly intact with no localized deformity, swelling, or tenderness.  Imaging Review All images taken were independently reviewed by me.  Assessment/Plan: The clinical and radiographic findings were reviewed and discussed at length with the patient.  The patient has left ankle instability.  We spoke at length about the natural course of these findings. We discussed nonoperative and operative treatment options in detail.  The risks and benefits were presented and reviewed. The risks due to hardware failure/irritation, new/persistent/recurrent infection, stiffness, nerve/vessel/tendon injury, nonunion/malunion of any fracture, wound healing issues, allograft usage, development of arthritis, failure of this surgery, possibility of external fixation in certain situations, possibility of delayed definitive surgery, need for further surgery, prolonged wound care including further soft tissue coverage procedures, thromboembolic events, anesthesia/medical complications/events perioperatively and beyond, amputation, death among others were discussed. The patient acknowledged the explanation and agreed to proceed with the plan.  [name]  Orthopaedic Surgery [facility]

## 2024-03-07 NOTE — Discharge Instructions (Addendum)
 Netta Cedars, MD [facility]  Please read the following information regarding your care after surgery.  Medications  You only need a prescription for the narcotic pain medicine (ex. oxycodone, Percocet, Norco).  All of the other medicines listed below are available over the counter. ? Aleve 2 pills twice a day for the first 3 days after surgery. Take next dose at/after 4:15p today. ? acetominophen (Tylenol) 650 mg every 4-6 hours as you need for minor to moderate pain. May take next dose after 2:15p today. ? oxycodone as prescribed for severe pain  ? To help prevent blood clots, take aspirin (81 mg) twice daily for 28 days after surgery.  You should also get up every hour while you are awake to move around.  Weight Bearing ? OK to walk on the operative leg only AFTER the nerve block has completely worn off.  Cast / Splint / Dressing ? Keep your dressing clean and dry.  Don't put anything (coat hanger, pencil, etc) down inside of it.  If it gets wet, please notify the office immediately.  Swelling IMPORTANT: It is normal for you to have swelling where you had surgery. To reduce swelling and pain, keep at least 3 pillows under your leg so that your toes are above your nose and your heel is above the level of your hip.  It may be necessary to keep your foot or leg elevated for several weeks.  This is critical to helping your incisions heal and your pain to feel better.  Follow Up Call my office at [phone] when you are discharged from the hospital or surgery center to schedule an appointment to be seen within 7-10 days after surgery.  Call my office at [phone] if you develop a fever >101.5 F, nausea, vomiting, bleeding from the surgical site or severe pain.        Post Anesthesia Home Care Instructions  Activity: Get plenty of rest for the remainder of the day. A responsible individual must stay with you for 24 hours following the procedure.  For the next 24 hours, DO  NOT: -Drive a car -Advertising copywriter -Drink alcoholic beverages -Take any medication unless instructed by your physician -Make any legal decisions or sign important papers.  Meals: Start with liquid foods such as gelatin or soup. Progress to regular foods as tolerated. Avoid greasy, spicy, heavy foods. If nausea and/or vomiting occur, drink only clear liquids until the nausea and/or vomiting subsides. Call your physician if vomiting continues.  Special Instructions/Symptoms: Your throat may feel dry or sore from the anesthesia or the breathing tube placed in your throat during surgery. If this causes discomfort, gargle with warm salt water. The discomfort should disappear within 24 hours.  If you had a scopolamine patch placed behind your ear for the management of post- operative nausea and/or vomiting:  1. The medication in the patch is effective for 72 hours, after which it should be removed.  Wrap patch in a tissue and discard in the trash. Wash hands thoroughly with soap and water. 2. You may remove the patch earlier than 72 hours if you experience unpleasant side effects which may include dry mouth, dizziness or visual disturbances. 3. Avoid touching the patch. Wash your hands with soap and water after contact with the patch.     Regional Anesthesia Blocks  1. You may not be able to move or feel the "blocked" extremity after a regional anesthetic block. This may last may last from 3-48 hours after placement, but it  will go away. The length of time depends on the medication injected and your individual response to the medication. As the nerves start to wake up, you may experience tingling as the movement and feeling returns to your extremity. If the numbness and inability to move your extremity has not gone away after 48 hours, please call your surgeon.   2. The extremity that is blocked will need to be protected until the numbness is gone and the strength has returned. Because you cannot  feel it, you will need to take extra care to avoid injury. Because it may be weak, you may have difficulty moving it or using it. You may not know what position it is in without looking at it while the block is in effect.  3. For blocks in the legs and feet, returning to weight bearing and walking needs to be done carefully. You will need to wait until the numbness is entirely gone and the strength has returned. You should be able to move your leg and foot normally before you try and bear weight or walk. You will need someone to be with you when you first try to ensure you do not fall and possibly risk injury.  4. Bruising and tenderness at the needle site are common side effects and will resolve in a few days.  5. Persistent numbness or new problems with movement should be communicated to the surgeon or the [facility] [phone] [facility] [phone]).    Next dose of Tylenol may be given at 2:15pm if needed. Next dose of NSAIDs (Ibuprofen/Motrin/Aleve) may be given at 4:15pm if needed.

## 2024-03-10 ENCOUNTER — Other Ambulatory Visit: Payer: Self-pay | Admitting: Family Medicine

## 2024-03-12 ENCOUNTER — Ambulatory Visit: Payer: Self-pay

## 2024-03-12 ENCOUNTER — Encounter (HOSPITAL_BASED_OUTPATIENT_CLINIC_OR_DEPARTMENT_OTHER): Payer: Self-pay | Admitting: Orthopaedic Surgery

## 2024-03-12 ENCOUNTER — Ambulatory Visit (HOSPITAL_BASED_OUTPATIENT_CLINIC_OR_DEPARTMENT_OTHER)
Admission: RE | Admit: 2024-03-12 | Discharge: 2024-03-12 | Disposition: A | Discharge status: Home / Self Care | Payer: HMO | Attending: Orthopaedic Surgery | Admitting: Orthopaedic Surgery

## 2024-03-12 ENCOUNTER — Institutional Professional Consult (permissible substitution): Payer: Medicare Other | Admitting: Psychology

## 2024-03-12 ENCOUNTER — Ambulatory Visit (HOSPITAL_BASED_OUTPATIENT_CLINIC_OR_DEPARTMENT_OTHER): Payer: Self-pay | Admitting: Anesthesiology

## 2024-03-12 ENCOUNTER — Other Ambulatory Visit: Payer: Self-pay

## 2024-03-12 ENCOUNTER — Encounter (HOSPITAL_BASED_OUTPATIENT_CLINIC_OR_DEPARTMENT_OTHER)
Admission: RE | Disposition: A | Discharge status: Home / Self Care | Payer: Self-pay | Source: Home / Self Care | Attending: Orthopaedic Surgery

## 2024-03-12 ENCOUNTER — Ambulatory Visit (HOSPITAL_BASED_OUTPATIENT_CLINIC_OR_DEPARTMENT_OTHER)

## 2024-03-12 DIAGNOSIS — K219 Gastro-esophageal reflux disease without esophagitis: Secondary | ICD-10-CM | POA: Insufficient documentation

## 2024-03-12 DIAGNOSIS — Z01818 Encounter for other preprocedural examination: Secondary | ICD-10-CM

## 2024-03-12 DIAGNOSIS — M65872 Other synovitis and tenosynovitis, left ankle and foot: Secondary | ICD-10-CM | POA: Diagnosis not present

## 2024-03-12 DIAGNOSIS — M797 Fibromyalgia: Secondary | ICD-10-CM | POA: Diagnosis not present

## 2024-03-12 DIAGNOSIS — G4733 Obstructive sleep apnea (adult) (pediatric): Secondary | ICD-10-CM | POA: Insufficient documentation

## 2024-03-12 DIAGNOSIS — G8918 Other acute postprocedural pain: Secondary | ICD-10-CM | POA: Diagnosis not present

## 2024-03-12 DIAGNOSIS — I2510 Atherosclerotic heart disease of native coronary artery without angina pectoris: Secondary | ICD-10-CM | POA: Diagnosis not present

## 2024-03-12 DIAGNOSIS — Z87891 Personal history of nicotine dependence: Secondary | ICD-10-CM

## 2024-03-12 DIAGNOSIS — M25372 Other instability, left ankle: Secondary | ICD-10-CM

## 2024-03-12 DIAGNOSIS — N183 Chronic kidney disease, stage 3 unspecified: Secondary | ICD-10-CM | POA: Diagnosis not present

## 2024-03-12 DIAGNOSIS — M7672 Peroneal tendinitis, left leg: Secondary | ICD-10-CM | POA: Diagnosis not present

## 2024-03-12 DIAGNOSIS — I129 Hypertensive chronic kidney disease with stage 1 through stage 4 chronic kidney disease, or unspecified chronic kidney disease: Secondary | ICD-10-CM | POA: Insufficient documentation

## 2024-03-12 DIAGNOSIS — J4489 Other specified chronic obstructive pulmonary disease: Secondary | ICD-10-CM | POA: Diagnosis not present

## 2024-03-12 DIAGNOSIS — I10 Essential (primary) hypertension: Secondary | ICD-10-CM | POA: Diagnosis not present

## 2024-03-12 DIAGNOSIS — S93402A Sprain of unspecified ligament of left ankle, initial encounter: Secondary | ICD-10-CM | POA: Diagnosis not present

## 2024-03-12 DIAGNOSIS — M94272 Chondromalacia, left ankle and joints of left foot: Secondary | ICD-10-CM | POA: Diagnosis not present

## 2024-03-12 HISTORY — PX: ANKLE ARTHROSCOPY WITH RECONSTRUCTION: SHX5583

## 2024-03-12 HISTORY — PX: REPAIR OF PERONEUS BREVIS TENDON: SHX6215

## 2024-03-12 SURGERY — ARTHROSCOPY, ANKLE, WITH RECONSTRUCTION
Anesthesia: Regional | Site: Ankle | Laterality: Left

## 2024-03-12 MED ORDER — CELECOXIB 200 MG PO CAPS
ORAL_CAPSULE | ORAL | Status: AC
  Filled 2024-03-12: qty 1

## 2024-03-12 MED ORDER — KETOROLAC TROMETHAMINE 30 MG/ML IJ SOLN
INTRAMUSCULAR | Status: AC
  Filled 2024-03-12: qty 1

## 2024-03-12 MED ORDER — FENTANYL CITRATE (PF) 100 MCG/2ML IJ SOLN
INTRAMUSCULAR | Status: AC
  Filled 2024-03-12: qty 2

## 2024-03-12 MED ORDER — CEFAZOLIN SODIUM-DEXTROSE 2-4 GM/100ML-% IV SOLN
2.0000 g | INTRAVENOUS | Status: AC
  Administered 2024-03-12: 2 g via INTRAVENOUS

## 2024-03-12 MED ORDER — SODIUM CHLORIDE 0.9 % IV SOLN: INTRAVENOUS | Status: DC | PRN

## 2024-03-12 MED ORDER — ACETAMINOPHEN 500 MG PO TABS
ORAL_TABLET | ORAL | Status: AC
  Filled 2024-03-12: qty 2

## 2024-03-12 MED ORDER — FENTANYL CITRATE (PF) 100 MCG/2ML IJ SOLN
INTRAMUSCULAR | Status: DC | PRN
  Administered 2024-03-12: 50 ug via INTRAVENOUS

## 2024-03-12 MED ORDER — FENTANYL CITRATE (PF) 100 MCG/2ML IJ SOLN: 25.0000 ug | INTRAMUSCULAR | Status: DC | PRN

## 2024-03-12 MED ORDER — CHLORHEXIDINE GLUCONATE 4 % EX SOLN
60.0000 mL | Freq: Once | CUTANEOUS | Status: DC
Start: 2024-03-12 — End: 2024-03-12

## 2024-03-12 MED ORDER — DEXAMETHASONE SODIUM PHOSPHATE 10 MG/ML IJ SOLN
INTRAMUSCULAR | Status: DC | PRN
  Administered 2024-03-12: 10 mg

## 2024-03-12 MED ORDER — SODIUM CHLORIDE 0.9 % IR SOLN
Status: DC | PRN
  Administered 2024-03-12: 1000 mL

## 2024-03-12 MED ORDER — ROPIVACAINE HCL 5 MG/ML IJ SOLN
INTRAMUSCULAR | Status: DC | PRN
  Administered 2024-03-12: 30 mL via PERINEURAL

## 2024-03-12 MED ORDER — LIDOCAINE 2% (20 MG/ML) 5 ML SYRINGE
INTRAMUSCULAR | Status: DC | PRN
  Administered 2024-03-12: 60 mg via INTRAVENOUS

## 2024-03-12 MED ORDER — MIDAZOLAM HCL 2 MG/2ML IJ SOLN
INTRAMUSCULAR | Status: AC
  Filled 2024-03-12: qty 2

## 2024-03-12 MED ORDER — PROPOFOL 10 MG/ML IV BOLUS
INTRAVENOUS | Status: DC | PRN
  Administered 2024-03-12: 150 ug/kg/min via INTRAVENOUS
  Administered 2024-03-12: 130 ug via INTRAVENOUS
  Administered 2024-03-12 (×2): 50 mg via INTRAVENOUS

## 2024-03-12 MED ORDER — ONDANSETRON HCL 4 MG/2ML IJ SOLN
INTRAMUSCULAR | Status: DC | PRN
  Administered 2024-03-12: 4 mg via INTRAVENOUS

## 2024-03-12 MED ORDER — POVIDONE-IODINE 10 % EX SOLN
CUTANEOUS | Status: DC | PRN
  Administered 2024-03-12: 1 via TOPICAL

## 2024-03-12 MED ORDER — DEXAMETHASONE SODIUM PHOSPHATE 10 MG/ML IJ SOLN
INTRAMUSCULAR | Status: AC
  Filled 2024-03-12: qty 1

## 2024-03-12 MED ORDER — ONDANSETRON HCL 4 MG/2ML IJ SOLN
INTRAMUSCULAR | Status: AC
  Filled 2024-03-12: qty 2

## 2024-03-12 MED ORDER — LACTATED RINGERS IV SOLN: INTRAVENOUS | Status: DC

## 2024-03-12 MED ORDER — CELECOXIB 200 MG PO CAPS
200.0000 mg | ORAL_CAPSULE | Freq: Once | ORAL | Status: AC
  Administered 2024-03-12: 200 mg via ORAL

## 2024-03-12 MED ORDER — MIDAZOLAM HCL 2 MG/2ML IJ SOLN
2.0000 mg | Freq: Once | INTRAMUSCULAR | Status: AC
  Administered 2024-03-12: 1 mg via INTRAVENOUS

## 2024-03-12 MED ORDER — AMISULPRIDE (ANTIEMETIC) 5 MG/2ML IV SOLN: 10.0000 mg | Freq: Once | INTRAVENOUS | Status: DC | PRN

## 2024-03-12 MED ORDER — LIDOCAINE 2% (20 MG/ML) 5 ML SYRINGE
INTRAMUSCULAR | Status: AC
  Filled 2024-03-12: qty 5

## 2024-03-12 MED ORDER — ALBUTEROL SULFATE (2.5 MG/3ML) 0.083% IN NEBU
2.5000 mg | INHALATION_SOLUTION | Freq: Once | RESPIRATORY_TRACT | Status: AC
  Administered 2024-03-12: 2.5 mg via RESPIRATORY_TRACT

## 2024-03-12 MED ORDER — ACETAMINOPHEN 500 MG PO TABS
1000.0000 mg | ORAL_TABLET | Freq: Once | ORAL | Status: AC
  Administered 2024-03-12: 1000 mg via ORAL

## 2024-03-12 MED ORDER — VANCOMYCIN HCL 500 MG IV SOLR
INTRAVENOUS | Status: AC
  Filled 2024-03-12: qty 50

## 2024-03-12 MED ORDER — CEFAZOLIN SODIUM-DEXTROSE 2-4 GM/100ML-% IV SOLN
INTRAVENOUS | Status: AC
  Filled 2024-03-12: qty 100

## 2024-03-12 MED ORDER — FENTANYL CITRATE (PF) 100 MCG/2ML IJ SOLN
100.0000 ug | Freq: Once | INTRAMUSCULAR | Status: AC
  Administered 2024-03-12: 50 ug via INTRAVENOUS

## 2024-03-12 MED ORDER — FENTANYL CITRATE (PF) 100 MCG/2ML IJ SOLN
INTRAMUSCULAR | Status: AC
Start: 2024-03-12 — End: ?
  Filled 2024-03-12: qty 2

## 2024-03-12 MED ORDER — ALBUTEROL SULFATE (2.5 MG/3ML) 0.083% IN NEBU
INHALATION_SOLUTION | RESPIRATORY_TRACT | Status: AC
  Filled 2024-03-12: qty 3

## 2024-03-12 SURGICAL SUPPLY — 63 items
ANCHOR SUT FBRTK 1.3 SUTTAP (Anchor) ×2 IMPLANT
BANDAGE ESMARK 6X9 LF (GAUZE/BANDAGES/DRESSINGS) IMPLANT
BLADE SURG 15 STRL LF DISP TIS (BLADE) ×5 IMPLANT
BNDG ELASTIC 4INX 5YD STR LF (GAUZE/BANDAGES/DRESSINGS) ×2 IMPLANT
BNDG ELASTIC 6X10 VLCR STRL LF (GAUZE/BANDAGES/DRESSINGS) ×1 IMPLANT
BNDG ESMARK 6X9 LF (GAUZE/BANDAGES/DRESSINGS) IMPLANT
BNDG GAUZE DERMACEA FLUFF 4 (GAUZE/BANDAGES/DRESSINGS) ×2 IMPLANT
BOOT STEPPER DURA LG (SOFTGOODS) IMPLANT
BOOT STEPPER DURA MED (SOFTGOODS) ×1 IMPLANT
BOOT STEPPER DURA SM (SOFTGOODS) IMPLANT
BURR OVAL 8 FLU 4.0X13 (MISCELLANEOUS) IMPLANT
CHLORAPREP W/TINT 26 (MISCELLANEOUS) ×2 IMPLANT
CUFF TRNQT CYL 34X4.125X (TOURNIQUET CUFF) ×1 IMPLANT
DISSECTOR 3.8MM X 13CM (MISCELLANEOUS) IMPLANT
DRAPE EXTREMITY T 121X128X90 (DISPOSABLE) ×2 IMPLANT
DRAPE OEC MINIVIEW 54X84 (DRAPES) ×1 IMPLANT
DRAPE U-SHAPE 47X51 STRL (DRAPES) ×2 IMPLANT
DRSG MEPITEL 4X7.2 (GAUZE/BANDAGES/DRESSINGS) ×2 IMPLANT
ELECT REM PT RETURN 9FT ADLT (ELECTROSURGICAL) ×2 IMPLANT
ELECTRODE REM PT RTRN 9FT ADLT (ELECTROSURGICAL) ×2 IMPLANT
EXCALIBUR 3.8MM X 13CM (MISCELLANEOUS) IMPLANT
GAUZE PAD ABD 8X10 STRL (GAUZE/BANDAGES/DRESSINGS) ×3 IMPLANT
GAUZE SPONGE 4X4 12PLY STRL (GAUZE/BANDAGES/DRESSINGS) ×2 IMPLANT
GAUZE XEROFORM 1X8 LF (GAUZE/BANDAGES/DRESSINGS) ×1 IMPLANT
GLOVE BIOGEL PI IND STRL 8 (GLOVE) ×2 IMPLANT
GLOVE SURG SS PI 7.5 STRL IVOR (GLOVE) ×2 IMPLANT
GOWN STRL REUS W/ TWL LRG LVL3 (GOWN DISPOSABLE) ×2 IMPLANT
GOWN STRL REUS W/ TWL XL LVL3 (GOWN DISPOSABLE) ×3 IMPLANT
KIT FIBERTAK DX 1.6 DISP (KITS) ×1 IMPLANT
MANIFOLD NEPTUNE II (INSTRUMENTS) ×1 IMPLANT
NANONEEDLE HIGHFLOW SHEATH 125 (SHEATH) ×2 IMPLANT
NDL SUT 6 .5 CRC .975X.05 MAYO (NEEDLE) IMPLANT
NS IRRIG 1000ML POUR BTL (IV SOLUTION) IMPLANT
PACK ARTHROSCOPY DSU (CUSTOM PROCEDURE TRAY) ×2 IMPLANT
PACK BASIN DAY SURGERY FS (CUSTOM PROCEDURE TRAY) ×2 IMPLANT
PAD CAST 4YDX4 CTTN HI CHSV (CAST SUPPLIES) ×1 IMPLANT
PADDING CAST COTTON 6X4 STRL (CAST SUPPLIES) IMPLANT
PENCIL SMOKE EVACUATOR (MISCELLANEOUS) ×1 IMPLANT
SANITIZER HAND PURELL FF 515ML (MISCELLANEOUS) ×2 IMPLANT
SCOPE NANONDL 125 (MISCELLANEOUS) IMPLANT
SCOPE NANONEEDLE 125 (MISCELLANEOUS) ×2 IMPLANT
SET IRRIG Y TYPE TUR BLADDER L (SET/KITS/TRAYS/PACK) ×2 IMPLANT
SHAVER DISSECTOR 3.0 (BURR) IMPLANT
SHAVER SABRE 2.0 (BURR) IMPLANT
SHEATH NANONDL HIGHFLOW 125 (SHEATH) IMPLANT
SHEATH NANONEEDLE HIGHFLOW 125 (SHEATH) ×2 IMPLANT
SLEEVE SCD COMPRESS KNEE MED (STOCKING) ×2 IMPLANT
SOL .9 NS 3000ML IRR UROMATIC (IV SOLUTION) ×2 IMPLANT
SPLINT PLASTER CAST FAST 5X30 (CAST SUPPLIES) IMPLANT
SPONGE T-LAP 18X18 ~~LOC~~+RFID (SPONGE) ×2 IMPLANT
STOCKINETTE 6 STRL (DRAPES) ×2 IMPLANT
STRAP ANKLE FOOT DISTRACTOR (ORTHOPEDIC SUPPLIES) IMPLANT
SUCTION TUBE FRAZIER 10FR DISP (SUCTIONS) ×1 IMPLANT
SUT ETHILON 2 0 FS 18 (SUTURE) ×2 IMPLANT
SUT VIC AB 0 CT1 27XBRD ANBCTR (SUTURE) ×1 IMPLANT
SUT VIC AB 2-0 CT1 TAPERPNT 27 (SUTURE) ×1 IMPLANT
SUT VIC AB 3-0 SH 27X BRD (SUTURE) IMPLANT
SYR BULB EAR ULCER 3OZ GRN STR (SYRINGE) ×2 IMPLANT
TOWEL GREEN STERILE FF (TOWEL DISPOSABLE) ×2 IMPLANT
TUBE CONNECTING 20X1/4 (TUBING) IMPLANT
TUBING ARTHROSCOPY IRRIG 16FT (MISCELLANEOUS) ×1 IMPLANT
WAND ABLATOR APOLLO I90 (BUR) IMPLANT
WATER STERILE IRR 1000ML POUR (IV SOLUTION) ×1 IMPLANT

## 2024-03-12 NOTE — Progress Notes (Signed)
Assisted Dr. Gavin Potters with left, adductor canal, popliteal, ultrasound guided block. Side rails up, monitors on throughout procedure. See vital signs in flow sheet. Tolerated Procedure well.

## 2024-03-12 NOTE — H&P (Signed)

## 2024-03-12 NOTE — Anesthesia Procedure Notes (Signed)
 Procedure Name: LMA Insertion Date/Time: 03/12/2024 8:47 AM  Performed by: Yolanda Bonine, CRNAPre-anesthesia Checklist: Patient identified, Emergency Drugs available, Suction available, Patient being monitored and Timeout performed Patient Re-evaluated:Patient Re-evaluated prior to induction Oxygen Delivery Method: Circle system utilized Preoxygenation: Pre-oxygenation with 100% oxygen Induction Type: IV induction Ventilation: Mask ventilation without difficulty LMA: LMA with gastric port inserted LMA Size: 4.0 Number of attempts: 1 Placement Confirmation: positive ETCO2 Dental Injury: Teeth and Oropharynx as per pre-operative assessment

## 2024-03-12 NOTE — Transfer of Care (Signed)
 Immediate Anesthesia Transfer of Care Note  Patient: [name]  Procedure(s) Performed: ANKLE ARTHROSCOPY WITH OPEN LATERAL ANKLE LIGAMENT STABILIZATION (Left: Ankle) POSSIBLE REPAIR OF PERONEUS TENDON DEBRIDEMENT (Left)  Patient Location: PACU  Anesthesia Type:GA combined with regional for post-op pain  Level of Consciousness: drowsy  Airway & Oxygen Therapy: Patient Spontanous Breathing and Patient connected to face mask oxygen  Post-op Assessment: Report given to RN and Post -op Vital signs reviewed and stable  Post vital signs: Reviewed and stable  Last Vitals:  Vitals Value Taken Time  BP 140/73 03/12/24 0949  Temp    Pulse    Resp 19 03/12/24 0951  SpO2    Vitals shown include unfiled device data.  Last Pain:  Vitals:   03/12/24 0718  TempSrc: Oral  PainSc: 7       Patients Stated Pain Goal: 9 (03/12/24 0718)  Complications: No notable events documented.

## 2024-03-12 NOTE — Anesthesia Postprocedure Evaluation (Signed)
 Anesthesia Post Note  Patient: [name]  Procedure(s) Performed: ANKLE ARTHROSCOPY WITH OPEN LATERAL ANKLE LIGAMENT STABILIZATION (Left: Ankle) REPAIR OF PERONEUS TENDON (Left: Ankle)     Patient location during evaluation: PACU Anesthesia Type: Regional and General Level of consciousness: awake and alert Pain management: pain level controlled Vital Signs Assessment: post-procedure vital signs reviewed and stable Respiratory status: spontaneous breathing, nonlabored ventilation, respiratory function stable and patient connected to nasal cannula oxygen Cardiovascular status: blood pressure returned to baseline and stable Postop Assessment: no apparent nausea or vomiting Anesthetic complications: no   No notable events documented.  Last Vitals:  Vitals:   03/12/24 1100 03/12/24 1120  BP: 111/72 130/66  Pulse: 77 85  Resp: 11   Temp:    SpO2: 93% 97%    Last Pain:  Vitals:   03/12/24 1120  TempSrc:   PainSc: 0-No pain                 Earl Lites P Nandita Mathenia

## 2024-03-12 NOTE — Anesthesia Procedure Notes (Signed)
 Anesthesia Regional Block: Popliteal block   Pre-Anesthetic Checklist: , timeout performed,  Correct Patient, Correct Site, Correct Laterality,  Correct Procedure, Correct Position, site marked,  Risks and benefits discussed,  Surgical consent,  Pre-op evaluation,  At surgeon's request and post-op pain management  Laterality: Left  Prep: Dura Prep       Needles:  Injection technique: Single-shot  Needle Type: Echogenic Stimulator Needle     Needle Length: 10cm  Needle Gauge: 20     Additional Needles:   Procedures:,,,, ultrasound used (permanent image in chart),,    Narrative:  Start time: 03/12/2024 8:17 AM End time: 03/12/2024 8:20 AM Injection made incrementally with aspirations every 5 mL.  Performed by: Personally  Anesthesiologist: Atilano Median, DO  Additional Notes: Patient identified. Risks/Benefits/Options discussed with patient including but not limited to bleeding, infection, nerve damage, failed block, incomplete pain control. Patient expressed understanding and wished to proceed. All questions were answered. Sterile technique was used throughout the entire procedure. Please see nursing notes for vital signs. Aspirated in 5cc intervals with injection for negative confirmation. Patient was given instructions on fall risk and not to get out of bed. All questions and concerns addressed with instructions to call with any issues or inadequate analgesia.

## 2024-03-12 NOTE — Op Note (Signed)
 03/12/2024  1:58 PM   PATIENT: [name]  78 y.o. female  MRN: [id number]   PRE-OPERATIVE DIAGNOSIS:   Left ankle instability   POST-OPERATIVE DIAGNOSIS:   Same   PROCEDURE: 1] Left ankle arthroscopic assisted debridement 2] Left lateral ankle ligament open stabilization Toniann Ket) 3] Left peroneus brevis tendon debridement and tenosynovectomy 4] Left peroneus longus tendon debridement and tenosynovectomy 5] Left ankle fluoroscopic examination under anesthesia   SURGEON:  Netta Cedars, MD   ASSISTANT: None   ANESTHESIA: General, regional   EBL: Minimal   TOURNIQUET:    Total Tourniquet Time Documented: Thigh (Left) - 37 minutes Total: Thigh (Left) - 37 minutes    COMPLICATIONS: None apparent   DISPOSITION: Extubated, awake and stable to recovery.   INDICATION FOR PROCEDURE: The patient presented with above diagnosis.  We discussed the diagnosis, alternative treatment options, risks and benefits of the above surgical intervention, as well as alternative non-operative treatments. All questions/concerns were addressed and the patient/family demonstrated appropriate understanding of the diagnosis, the procedure, the postoperative course, and overall prognosis. The patient wished to proceed with surgical intervention and signed an informed surgical consent as such, in each others presence prior to surgery.   PROCEDURE IN DETAIL: After preoperative consent was obtained and the correct operative site was identified, the patient was brought to the operating room supine on stretcher and transferred onto operating table. General anesthesia was induced. Preoperative antibiotics were administered. Surgical timeout was taken. The patient was then positioned supine with an ipsilateral hip bump. The operative lower extremity was prepped and draped in standard sterile fashion with a tourniquet around the thigh. The extremity was exsanguinated and the tourniquet was  inflated to 275 mmHg.  Routine evaluation of the ankle joint demonstrated gross lateral laxity with drawer testing. Full dorsiflexion as well as plantarflexion was possible.   We began by insufflating the ankle joint thru anteromedial approach. The anteromedial portal was carefully established medial to the tibialis anterior tendon. The arthroscopic trochar with blunt was inserted and then camera placed. There was excellent visualization of the joint and routine diagnostic ankle arthroscopy was performed. Of note, there was mild synovitis throughout the joint noted. Mild chondral changes in the tibiotalar joint surfaces. No loose bodies were encountered and no anterior ankle impingement was identified on max dorsiflexion. The deltoid and syndesmosis ligaments were stressed and noted to be stable. Arthroscopic assisted debridement of the ankle joint was performed.    A standard curvilinear approach was made over the lateral ankle ligament complex and the distal lateral malleolus.    We began by windowing the approach posteriorly to access the peroneal tendons. The sheath was incised and tenosynovial fluid was readily evacuated. We then sequentially evaluated both the peroneus longus and brevis tendons with no tearing noted. We did note extensive tenosynovitis that was debrided thoroughly. There was no instability of peroneal tendons to intraoperative stress testing. The peroneal tendon sheath was closed with vicryl.    Dissection was the carried down to the level of the lateral ankle ligament complex. We sharply incised the capsule and the complex just distal to the tip of the fibula leaving a small cuff of tissue for repair after advancement. The proximal flap was elevated carefully off the lateral malleolus. We then used a rongeur to roughen the distal lateral malleolus. Two Arthrex FiberTak anchors were implanted using standard technique in the anatomic footprints of the ATFL and CFL ligaments. These  were verified by manual stress to be well seated  within bone. The suture needles were sequentially passed through the distal flap, tied, and then brought back proximally into the proximal flap were they were then tied.The foot was held in eversion throughout to set appropriate tension and protect the repair. Intraoperative ankle testing demonstrated improved stability of the newly reconstructed lateral ankle ligament complex.  A manual external rotation stress radiograph was obtained and demonstrated symmetry of the ankle mortise with complete stability to testing.   The surgical sites were thoroughly irrigated. The tourniquet was deflated and hemostasis achieved. Betadine and vancomycin powder were applied. The deep layers were closed using 2-0 vicryl. The skin was closed without tension using 2-0 nylon suture.    The leg was cleaned with saline and sterile dressings with gauze were applied. A well padded CAM boot was applied. The patient was awakened from anesthesia and transported to the recovery room in stable condition.    FOLLOW UP PLAN: -transfer to PACU, then home -protected WB operative extremity, maximum elevation -maintain boot until follow up -DVT ppx: Aspirin 81 mg twice daily -follow up as outpatient within 7-10 days for wound check -sutures out in 2-3 weeks in outpatient office   RADIOGRAPHS: AP, lateral, oblique and stress radiographs of the left ankle were obtained intraoperatively. Manual stress radiographs were taken and the joints were noted to be stable following procedure. No other acute injuries are noted.   Netta Cedars [facility]

## 2024-03-12 NOTE — Anesthesia Procedure Notes (Signed)
 Anesthesia Regional Block: Adductor canal block   Pre-Anesthetic Checklist: , timeout performed,  Correct Patient, Correct Site, Correct Laterality,  Correct Procedure, Correct Position, site marked,  Risks and benefits discussed,  Surgical consent,  Pre-op evaluation,  At surgeon's request and post-op pain management  Laterality: Left  Prep: Dura Prep       Needles:  Injection technique: Single-shot  Needle Type: Echogenic Stimulator Needle     Needle Length: 10cm  Needle Gauge: 20     Additional Needles:   Procedures:,,,, ultrasound used (permanent image in chart),,    Narrative:  Start time: 03/12/2024 8:14 AM End time: 03/12/2024 8:18 AM Injection made incrementally with aspirations every 5 mL.  Performed by: Personally  Anesthesiologist: Atilano Median, DO  Additional Notes: Patient identified. Risks/Benefits/Options discussed with patient including but not limited to bleeding, infection, nerve damage, failed block, incomplete pain control. Patient expressed understanding and wished to proceed. All questions were answered. Sterile technique was used throughout the entire procedure. Please see nursing notes for vital signs. Aspirated in 5cc intervals with injection for negative confirmation. Patient was given instructions on fall risk and not to get out of bed. All questions and concerns addressed with instructions to call with any issues or inadequate analgesia.

## 2024-03-12 NOTE — Anesthesia Preprocedure Evaluation (Addendum)
 Anesthesia Evaluation  Patient identified by MRN, date of birth, ID band Patient awake    Reviewed: Allergy & Precautions, NPO status , Patient's Chart, lab work & pertinent test results  History of Anesthesia Complications (+) MALIGNANT HYPERTHERMIA, Family history of anesthesia reaction and history of anesthetic complications (father with MH)  Airway Mallampati: II  TM Distance: >3 FB Neck ROM: Full    Dental no notable dental hx.    Pulmonary asthma , sleep apnea , COPD, former smoker   Pulmonary exam normal        Cardiovascular hypertension, Pt. on medications and Pt. on home beta blockers + CAD   Rhythm:Regular Rate:Normal     Neuro/Psych  Headaches  Anxiety Depression       GI/Hepatic Neg liver ROS, hiatal hernia,GERD  Medicated,,  Endo/Other  negative endocrine ROS    Renal/GU CRFRenal disease  negative genitourinary   Musculoskeletal  (+) Arthritis ,  Fibromyalgia -  Abdominal Normal abdominal exam  (+)   Peds  Hematology  (+) Blood dyscrasia, anemia   Anesthesia Other Findings   Reproductive/Obstetrics                             Anesthesia Physical Anesthesia Plan  ASA: 3  Anesthesia Plan: General and Regional   Post-op Pain Management: Regional block*   Induction: Intravenous  PONV Risk Score and Plan: 3 and Ondansetron, Dexamethasone, Propofol infusion, TIVA, Midazolam and Treatment may vary due to age or medical condition  Airway Management Planned: Mask and LMA  Additional Equipment: None  Intra-op Plan:   Post-operative Plan: Extubation in OR  Informed Consent: I have reviewed the patients History and Physical, chart, labs and discussed the procedure including the risks, benefits and alternatives for the proposed anesthesia with the patient or authorized representative who has indicated his/her understanding and acceptance.     Dental advisory  given  Plan Discussed with: CRNA  Anesthesia Plan Comments:        Anesthesia Quick Evaluation

## 2024-03-13 ENCOUNTER — Encounter (HOSPITAL_BASED_OUTPATIENT_CLINIC_OR_DEPARTMENT_OTHER): Payer: Self-pay | Admitting: Orthopaedic Surgery

## 2024-03-16 NOTE — Assessment & Plan Note (Addendum)
Statin intolerant. Encourage heart healthy diet such as MIND or DASH diet, increase exercise, avoid trans fats, simple carbohydrates and processed foods, consider a krill or fish or flaxseed oil cap daily.

## 2024-03-16 NOTE — Assessment & Plan Note (Signed)
 No recent exacerbation

## 2024-03-16 NOTE — Assessment & Plan Note (Signed)
 hgba1c acceptable, minimize simple carbs. Increase exercise as tolerated.

## 2024-03-16 NOTE — Assessment & Plan Note (Signed)
 Improving with medication adjustments

## 2024-03-16 NOTE — Assessment & Plan Note (Signed)
 Monitor and report, no changes to meds. Encouraged heart healthy diet such as the DASH diet and exercise as tolerated.

## 2024-03-16 NOTE — Assessment & Plan Note (Signed)
 Supplement and monitor

## 2024-03-17 ENCOUNTER — Encounter: Payer: Self-pay | Admitting: Professional

## 2024-03-17 ENCOUNTER — Ambulatory Visit (INDEPENDENT_AMBULATORY_CARE_PROVIDER_SITE_OTHER): Payer: HMO | Admitting: Professional

## 2024-03-17 DIAGNOSIS — F331 Major depressive disorder, recurrent, moderate: Secondary | ICD-10-CM

## 2024-03-17 DIAGNOSIS — F411 Generalized anxiety disorder: Secondary | ICD-10-CM | POA: Diagnosis not present

## 2024-03-17 DIAGNOSIS — F4310 Post-traumatic stress disorder, unspecified: Secondary | ICD-10-CM | POA: Diagnosis not present

## 2024-03-17 NOTE — Progress Notes (Signed)
 [facility] Counselor/Therapist Progress Note  Patient ID: [name], MRN: [id number],    Date: 03/17/2024  Time Spent: 53 minutes 1006-1059am   Treatment Type: Individual Therapy  Risk Assessment: Danger to Self:  No Self-injurious Behavior: No Danger to Others: No  Subjective: Patient arrived late for her in-person session due to having been sent to the wrong location.  Issues addressed: 1-mood a-"nothing was really bad" b-there was one time for about a hay and a half where she was fighting herself c-"I was using every positive word I could think of and I didn't fall down again" -pt thinks she got really nervous about the surgery and having to be put under at her age with other breathing complications d-she continues to use her "God box" -pt feels thankful for so many things 2-children a-one incident with girls that she doesn't remember -she held firm with her no and they called to chat within two days b-son has called and checked on her -[name] married a woman at 62 and [name] was 24 -they began dating when the pt was 44 -pt was not very kind to [name] -they have parted on numerous occasions and are now stronger -they were married for 7 years before they had a child, [name] now has three children and she has never met them -"she has managed to alienate me form [name], his wife, and his family -they have now been married for 36 years 3-physical a-had her foot surgery -she had her foot reconstructed -was surprised at how painful it was -the first day and a half she was good -on day three her foot felt so bad -she is casted and experiences pain where the incision 5-communication -discussed pt's communication that impacts relationships with her family  Treatment Plan:   Goals Alleviate depressive symptoms Recognize, accept, and cope with depressive feelings Develop healthy thinking patterns Develop healthy interpersonal relationships (particularly  younger daughter) Reduce overall frequency, intensity, and duration of anxiety Stabilize anxiety level while increasing ability to function Enhance ability to effectively cope with full variety of stressors Learn and implement coping skills that result in a reduction of anxiety    Objectives target date for all objectives is 09/26/2024 Verbalize an understanding of the cognitive, physiological, and behavioral components of anxiety       60 Learning and implement calming skills to reduce overall anxiety           60 Verbalize an understanding of the role that cognitive biases play in excessive irrational worry and persistent anxiety symptoms   70 Identify, challenge, and replace biased fearful self-talk            60 Learn and implement problem solving strategies             70 Identify and engage in pleasant activities              80 Learning and implement personal and interpersonal skills to reduce anxiety and improve interpersonal relationships    60 Learn to accept limitations in life and commit to tolerating, rather than avoiding, unpleasant emotions while accomplishing meaningful goals 60 Identify major life conflicts from the past and present that form the basis for present anxiety       70 Maintain involvement in work, family, and social activities           70 Reestablish a consistent sleep-wake cycle             50 Verbalize an accurate understanding of  depression            60 Identify and replace thoughts that support depression            50 Learn and implement behavioral strategies Verbalize an understanding and resolution of current interpersonal problems Learn and implement decision making skills Learn and implement conflict resolution skills to resolve interpersonal problems Verbalize an understanding of healthy and unhealthy emotions verbalize insight into how past relationships may be influence current experiences with depression Use mindfulness and acceptance strategies  and increase value based behavior  Increase hopeful statements about the future.  Interventions Engage the patient in behavioral activation Use instruction, modeling, and role-playing to build the patient 's general social, communication, and/or conflict resolution skills Use Acceptance and Commitment Therapy to help patient  accept uncomfortable realities in order to accomplish value-consistent goals Reinforce the patient 's insight into the role of her past emotional pain and present anxiety  Support the patient in following through with work, family, and social activities Teach and implement sleep hygiene practices  Teach the patient relaxation skills Assign the patient homework Discuss examples demonstrating that unrealistic worry overestimates the probability of threats and underestimates patient's ability  Assist the patient in analyzing his or her worries Help patient understand that avoidance is reinforcing  Consistent with treatment model, discuss how change in cognitive, behavioral, and interpersonal can help patient alleviate depression CBT Behavioral activation to help the patient explore the relationship, nature of the dispute Help the patient develop new interpersonal skills and relationships Conduct Problem solving therapy Teach conflict resolution skills Use a process-experiential approach Conduct ACT  Diagnosis:Major depressive disorder, recurrent episode, moderate (HCC)  Generalized anxiety disorder  PTSD (post-traumatic stress disorder)  Plan:  -meet on Wednesday, April 08, 2024 at [employer].

## 2024-03-18 ENCOUNTER — Other Ambulatory Visit: Payer: Self-pay | Admitting: Family Medicine

## 2024-03-18 ENCOUNTER — Telehealth (INDEPENDENT_AMBULATORY_CARE_PROVIDER_SITE_OTHER): Payer: Medicare Other | Admitting: Family Medicine

## 2024-03-18 VITALS — BP 120/89 | HR 86

## 2024-03-18 DIAGNOSIS — E538 Deficiency of other specified B group vitamins: Secondary | ICD-10-CM | POA: Diagnosis not present

## 2024-03-18 DIAGNOSIS — I10 Essential (primary) hypertension: Secondary | ICD-10-CM

## 2024-03-18 DIAGNOSIS — E785 Hyperlipidemia, unspecified: Secondary | ICD-10-CM

## 2024-03-18 DIAGNOSIS — J45909 Unspecified asthma, uncomplicated: Secondary | ICD-10-CM | POA: Diagnosis not present

## 2024-03-18 DIAGNOSIS — M5416 Radiculopathy, lumbar region: Secondary | ICD-10-CM | POA: Diagnosis not present

## 2024-03-18 DIAGNOSIS — Z9889 Other specified postprocedural states: Secondary | ICD-10-CM | POA: Diagnosis not present

## 2024-03-18 DIAGNOSIS — F418 Other specified anxiety disorders: Secondary | ICD-10-CM | POA: Diagnosis not present

## 2024-03-18 DIAGNOSIS — I2510 Atherosclerotic heart disease of native coronary artery without angina pectoris: Secondary | ICD-10-CM

## 2024-03-18 DIAGNOSIS — M51361 Other intervertebral disc degeneration, lumbar region with lower extremity pain only: Secondary | ICD-10-CM | POA: Diagnosis not present

## 2024-03-18 DIAGNOSIS — R739 Hyperglycemia, unspecified: Secondary | ICD-10-CM | POA: Diagnosis not present

## 2024-03-18 DIAGNOSIS — M542 Cervicalgia: Secondary | ICD-10-CM | POA: Diagnosis not present

## 2024-03-18 DIAGNOSIS — M549 Dorsalgia, unspecified: Secondary | ICD-10-CM | POA: Diagnosis not present

## 2024-03-19 ENCOUNTER — Encounter: Payer: Medicare Other | Admitting: Psychology

## 2024-03-19 ENCOUNTER — Encounter: Payer: Self-pay | Admitting: Family Medicine

## 2024-03-19 DIAGNOSIS — M7672 Peroneal tendinitis, left leg: Secondary | ICD-10-CM | POA: Insufficient documentation

## 2024-03-19 NOTE — Progress Notes (Addendum)
 MyChart Video Visit    Virtual Visit via Video Note   This patient is at least at moderate risk for complications without adequate follow up. This format is felt to be most appropriate for this patient at this time. Physical exam was limited by quality of the video and audio technology used for the visit. [name], CMA was able to get the patient set up on a video visit.  Patient location: home Patient and provider in visit Provider location: Office  I discussed the limitations of evaluation and management by telemedicine and the availability of in person appointments. The patient expressed understanding and agreed to proceed.    Today's healthcare provider: Danise Edge, MD  Subjective:    Patient ID: [name], female    DOB: [birth year], 79 y.o.   MRN: [id number]  Chief Complaint  Patient presents with   Follow-up    HPI Discussed the use of AI scribe software for clinical note transcription with the patient, who gave verbal consent to proceed.  History of Present Illness [name] is a 78 year old female who presents with left ankle pain post-surgery.  She is experiencing significant pain in her left ankle following recent surgery to fix a tendon and stabilize the ankle. Her foot was swollen when the cast was applied, and now that the swelling has decreased, her foot moves around in the cast, which she believes is contributing to her pain. She is managing her pain with Tylenol and aspirin and is reluctant to increase her tramadol dosage despite being offered more. She tries to keep her foot as still as possible to manage the pain.  She is seeking clarification on her weight-bearing status, as she was initially told to stay off her foot for three weeks, but the surgery was not as extensive as anticipated. She is scheduled to see Dr. Ethelene Hal this afternoon and Dr. Ignacia Palma on April 14th.  She has not started taking Prozac, which was previously prescribed, as she wanted to  avoid starting a new medication before her surgery. She feels good about her depression and has not experienced any significant depressive episodes recently. She had a video session with her therapist, who diagnosed her with passive-aggressive behavior, which has been emotionally challenging for her.  She reports ongoing pain in her elbows, shoulders, and other areas, and has been advised by several specialists to see a rheumatologist. She has a history of immune deficiency, which was previously managed by Dr. Roselle Locus. She is considering returning to Dr. Dierdre Forth for further evaluation.  She experiences daily debilitating headaches and is scheduled to see Dr. Zella Ball next week to discuss whether a psychiatric evaluation is necessary. She is concerned about the impact of these headaches on her quality of life.    Past Medical History:  Diagnosis Date   Abdominal pain 10/26/2013   Allergic rhinitis 01/25/2016   ANA positive 07/29/2013   Anemia    Arthralgia 08/18/2013   Arthritis    Asthma, allergic 08/05/2012   Atrial flutter    past history- not current   Atypical chest pain 01/01/2015   Bilateral carpal tunnel syndrome 03/04/2015   CAD in native artery 08/25/2010   Minor CAD 2009, 2011, low risk Myoview 2013 and 2016        Cataract    bilaterally removed    Cellulitis of breast 02/05/2023   Chronic constipation    Chronic cough 01/09/2017   Chronic interstitial cystitis 02/01/2017   Chronic night sweats 01/08/2015  Common bile duct dilatation 02/13/2023   Concussion    x 3   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   DDD (degenerative disc disease), cervical 09/17/2018   Degenerative scoliosis 04/22/2018   Deviated septum 05/12/2016   Dysphagia    Dyspnea    Dysuria 03/09/2021   Elevated alkaline phosphatase level 01/25/2023   Essential hypertension 08/25/2010   External hemorrhoids 02/05/2015   Facial trauma 01/22/2023   Family history of adverse reaction to anesthesia     Family history of malignant hyperthermia    father had this   Fibrocystic breast disease 10/18/2010   Fibromyalgia    Frequency of urination    Generalized anxiety disorder 08/03/2010   GERD (gastroesophageal reflux disease)    Headache 10/13/2013   High serum vitamin D 05/18/2021   History of chronic bronchitis    History of colonic polyp 02/25/2019   History of hiatal hernia    History of tachycardia    CONTROLLED  WITH ATENOLOL   Hyperglycemia 03/08/2021   Hyperkalemia 09/28/2022   Hyperlipidemia    Intertrigo 12/13/2022   Kidney stone 02/26/2019   Knee pain, right 04/30/2015   Left hip pain 04/30/2015   Leg pain 02/24/2011   Qualifier: Diagnosis of   By: Laury Axon DOMyrene Buddy         Leukocytosis 07/05/2022   Local reaction to hymenoptera sting 05/09/2023   Low back pain 09/08/2020   Major depressive disorder 02/05/2013   Malignant neoplasm of lower-outer quadrant of right breast of female, estrogen receptor positive 01/03/2013   Nasal turbinate hypertrophy 05/12/2016   Oral herpes 02/05/2023   OSA and COPD overlap syndrome 06/10/2020   Osteoporosis 01/2019   T score -2.2 stable/improved from prior study   Pelvic pain    Peripheral neuropathy 05/22/2014   Personal history of radiation therapy    Pneumonia    Primary insomnia 06/25/2017   Pulmonary nodules 02/12/2015   Rash and other nonspecific skin eruption 05/09/2023   Recurrent falls 05/02/2020   Recurrent sinusitis 02/25/2019   Right arm pain 07/20/2016   RLS (restless legs syndrome) 11/09/2019   RUQ pain 07/05/2022   S/P radiation therapy 11/12/12 - 12/05/12   right Breast   Sciatica 04/30/2015   Sepsis 2014   from UTI    Sjogren's disease 12/12/2021   Splenic lesion 09/02/2012   Sprain of lateral ligament of ankle joint 07/31/2023   Stage 3 chronic kidney disease    Statin intolerance 02/29/2020   Stricture and stenosis of esophagus 10/19/2011   Tongue lesion 02/05/2023   Tremor 07/05/2022   Urgency of  urination    Vertigo 01/22/2023   Vitamin B12 deficiency 03/08/2021   Vocal cord cyst 05/18/2021    Past Surgical History:  Procedure Laterality Date   24 HOUR PH STUDY N/A 04/03/2016   Procedure: 24 HOUR PH STUDY;  Surgeon: Ruffin Frederick, MD;  Location: [facility];  Service: Gastroenterology;  Laterality: N/A;   ANKLE ARTHROSCOPY WITH RECONSTRUCTION Left 03/12/2024   Procedure: ANKLE ARTHROSCOPY WITH OPEN LATERAL ANKLE LIGAMENT STABILIZATION;  Surgeon: Netta Cedars, MD;  Location: [facility];  Service: Orthopedics;  Laterality: Left;   BREAST BIOPSY Right 08/23/2012   ADH   BREAST BIOPSY Right 10/11/2012   Ductal Carcinoma   BREAST EXCISIONAL BIOPSY Right 09/04/2013   benign   BREAST LUMPECTOMY Right 10/11/2012   W/ [facility] BX   CARDIAC CATHETERIZATION  09-13-2007  DR Riley Kill   WELL-PRESERVED LVF/  DIFFUSE SCATTERED CORONARY CALCIFACATION AND ATHEROSCLEROSIS  WITHOUT OBSTRUCTION   CARDIAC CATHETERIZATION  08-04-2010  DR Clifton James   NON-OBSTRUCTIVE CAD/  pLAD 40%/  oLAD 30%/  mLAD 30%/  pRCA 30%/  EF 60%   CARDIOVASCULAR STRESS TEST  06-18-2012  DR McALHANY   LOW RISK NUCLEAR STUDY/  SMALL FIXED AREA OF MODERATELY DECREASED UPTAKE IN ANTEROSEPTAL WALL WHICH MAY BE ARTIFACTUAL/  NO ISCHEMIA/  EF 68%   COLONOSCOPY  09/29/2010   CYSTOSCOPY     CYSTOSCOPY WITH HYDRODISTENSION AND BIOPSY N/A 03/06/2014   Procedure: CYSTOSCOPY/HYDRODISTENSION/ INSTILATION OF MARCAINE AND PYRIDIUM;  Surgeon: Kathi Ludwig, MD;  Location: [facility];  Service: Urology;  Laterality: N/A;   CYSTOSCOPY/URETEROSCOPY/HOLMIUM LASER/STENT PLACEMENT Left 03/21/2023   Procedure: CYSTOSCOPY LEFT RETROGRADE PYELOGRAM, LEFT URETEROSCOPY, HOLMIUM LASER LITHOTRIPSYM, AND LEFT URETERAL STENT PLACEMENT;  Surgeon: Rene Paci, MD;  Location: [facility];  Service: Urology;  Laterality: Left;  60 MINUTES   ESOPHAGEAL MANOMETRY N/A 04/03/2016   Procedure: ESOPHAGEAL  MANOMETRY (EM);  Surgeon: Ruffin Frederick, MD;  Location: [facility];  Service: Gastroenterology;  Laterality: N/A;   EXTRACORPOREAL SHOCK WAVE LITHOTRIPSY Left 02/06/2019   Procedure: EXTRACORPOREAL SHOCK WAVE LITHOTRIPSY (ESWL);  Surgeon: Ihor Gully, MD;  Location: [facility];  Service: Urology;  Laterality: Left;   NASAL SINUS SURGERY  1985   ORIF RIGHT ANKLE  FX  2006   POLYPECTOMY     REMOVAL VOCAL CORD CYST  01/2013   REPAIR OF PERONEUS BREVIS TENDON Left 03/12/2024   Procedure: REPAIR OF PERONEUS TENDON;  Surgeon: Netta Cedars, MD;  Location: [facility];  Service: Orthopedics;  Laterality: Left;   RIGHT BREAST BX  08/23/2012   RIGHT HAND SURGERY  X3  LAST ONE 2009   INCLUDES  ORIF RIGHT 5TH FINGER AND REVISION TWICE   SKIN CANCER EXCISION     face,arms   TONSILLECTOMY AND ADENOIDECTOMY  AGE 70   TOTAL ABDOMINAL HYSTERECTOMY W/ BILATERAL SALPINGOOPHORECTOMY  1982   W/  APPENDECTOMY   TRANSTHORACIC ECHOCARDIOGRAM  06/24/2012   GRADE I DIASTOLIC DYSFUNCTION/  EF 55-60%/  MILD MR   UPPER GASTROINTESTINAL ENDOSCOPY      Family History  Problem Relation Age of Onset   Rectal cancer Mother    Colon cancer Mother    Pancreatic cancer Mother    Diabetes Mother    Breast cancer Mother 13   Breast cancer Maternal Aunt        breast   Birth defects Maternal Aunt    Irritable bowel syndrome Son    Heart disease Son        CAD, MV replacement   Allergic Disorder Daughter    Diabetes Daughter    Colon cancer Father    Colon polyps Father    Diabetes Father    Stroke Father    Heart disease Father        CHF   Hyperlipidemia Father    Hypertension Father    Arthritis Father    Breast cancer Paternal Aunt    Breast cancer Paternal Aunt    Arthritis Paternal Uncle    Allergic Disorder Daughter    Heart disease Cousin        CAD, had a blood clot after stenting   Esophageal cancer Neg Hx    Stomach cancer Neg Hx     Social History    Socioeconomic History   Marital status: Married    Spouse name: Windy Fast    Number of children: 3   Years of  education: 16   Highest education level: Bachelor's degree (e.g., BA, AB, BS)  Occupational History   Occupation: Retired    Comment: Charity fundraiser  Tobacco Use   Smoking status: Former    Current packs/day: 0.00    Average packs/day: 1 pack/day for 15.0 years (15.0 ttl pk-yrs)    Types: Cigarettes    Start date: 05/27/1990    Quit date: 05/27/2005    Years since quitting: 18.8    Passive exposure: Never   Smokeless tobacco: Never  Vaping Use   Vaping status: Never Used  Substance and Sexual Activity   Alcohol use: No    Alcohol/week: 0.0 standard drinks of alcohol   Drug use: No   Sexual activity: Not Currently    Partners: Male    Birth control/protection: Surgical    Comment: 1st intercourse 78 yo-Fewer than 5 partners, hysterectomy  Other Topics Concern   Not on file  Social History Narrative   Lives with husband.   Caffeine use: 1/2 cup per day   Exercise-- 2days a week [employer],  Water aerobics, walking       Retired Engineer, civil (consulting)   No dietary restrictions, tries to maintain a heart healthy diet   Very pleasant lady   Social Drivers of Corporate investment banker Strain: Low Risk  (03/04/2024)   Overall Financial Resource Strain (CARDIA)    Difficulty of Paying Living Expenses: Not hard at all  Food Insecurity: No Food Insecurity (03/04/2024)   Hunger Vital Sign    Worried About Running Out of Food in the Last Year: Never true    Ran Out of Food in the Last Year: Never true  Transportation Needs: No Transportation Needs (03/04/2024)   PRAPARE - Administrator, Civil Service (Medical): No    Lack of Transportation (Non-Medical): No  Physical Activity: Insufficiently Active (03/04/2024)   Exercise Vital Sign    Days of Exercise per Week: 3 days    Minutes of Exercise per Session: 20 min  Stress: No Stress Concern Present (03/04/2024)   [employer] of  Occupational Health - Occupational Stress Questionnaire    Feeling of Stress : Not at all  Social Connections: Socially Integrated (03/04/2024)   Social Connection and Isolation Panel [NHANES]    Frequency of Communication with Friends and Family: More than three times a week    Frequency of Social Gatherings with Friends and Family: More than three times a week    Attends Religious Services: More than 4 times per year    Active Member of [employer] or Organizations: Yes    Attends Engineer, structural: More than 4 times per year    Marital Status: Married  Catering manager Violence: Not At Risk (03/04/2024)   Humiliation, Afraid, Rape, and Kick questionnaire    Fear of Current or Ex-Partner: No    Emotionally Abused: No    Physically Abused: No    Sexually Abused: No    Outpatient Medications Prior to Visit  Medication Sig Dispense Refill   acetaminophen (TYLENOL) 500 MG tablet Take 1,000 mg by mouth every 6 (six) hours as needed for mild pain or headache.      albuterol (VENTOLIN HFA) 108 (90 Base) MCG/ACT inhaler Inhale 2 puffs into the lungs every 4 (four) hours as needed for wheezing or shortness of breath (coughing fits). 18 g 1   amLODipine (NORVASC) 5 MG tablet Take 5 mg by mouth daily.     aspirin EC 81 MG tablet Take  1 tablet (81 mg total) by mouth daily. 90 tablet 3   atenolol (TENORMIN) 25 MG tablet Take 1 tablet (25 mg total) by mouth 2 (two) times daily. Take 1 tablet by mouth 2 time a day 180 tablet 3   carboxymethylcellul-glycerin (OPTIVE) 0.5-0.9 % ophthalmic solution Place 1 drop into both eyes daily as needed for dry eyes.     Cholecalciferol (VITAMIN D3) 50 MCG (2000 UT) CAPS Take 2,000 Units by mouth daily.     cyanocobalamin (VITAMIN B12) 1000 MCG tablet Take 1,000 mg by mouth daily with breakfast.     diclofenac Sodium (VOLTAREN) 1 % GEL Apply 2 g topically daily as needed (Back pain).     DULoxetine (CYMBALTA) 30 MG capsule Take 1 capsule (30 mg total) by mouth  daily at 12 noon. 30 capsule 3   DULoxetine (CYMBALTA) 60 MG capsule Take 1 capsule (60 mg total) by mouth every morning. 90 capsule 1   EPINEPHrine 0.3 mg/0.3 mL IJ SOAJ injection Inject 0.3 mg into the muscle as needed for anaphylaxis. 2 each 1   famotidine (PEPCID) 20 MG tablet TAKE 1 TABLET BY MOUTH TWICE DAILY AS NEEDED FOR  HEARTBURN  OR  INDIGESTION 60 tablet 0   fluticasone (FLONASE) 50 MCG/ACT nasal spray Use 2 spray(s) in each nostril once daily (Patient taking differently: Place 2 sprays into both nostrils daily as needed for allergies or rhinitis.) 16 g 5   fluticasone (FLOVENT HFA) 110 MCG/ACT inhaler Inhale 2 puffs into the lungs 2 (two) times daily. 1 each 5   folic acid (FOLVITE) 1 MG tablet Take 1 tablet (1 mg total) by mouth daily.     furosemide (LASIX) 20 MG tablet TAKE 1 TABLET BY MOUTH AS NEEDED FOR EDEMA 90 tablet 3   gabapentin (NEURONTIN) 300 MG capsule Take 300-900 mg by mouth See admin instructions. Take 600 mg in the morning 300 mg at lunch time and 900 mg at bedtime     hydrOXYzine (ATARAX) 10 MG tablet Take 0.5-2 tablets (5-20 mg total) by mouth 3 (three) times daily as needed for anxiety (not with Loratadine). 90 tablet 2   loratadine (CLARITIN) 10 MG tablet Take 10 mg by mouth daily as needed for rhinitis.     nitroGLYCERIN (NITROSTAT) 0.4 MG SL tablet Place 1 tablet (0.4 mg total) under the tongue every 5 (five) minutes x 3 doses as needed for chest pain. 25 tablet 2   nystatin cream (MYCOSTATIN) Apply 1 application topically 2 (two) times daily. (Patient taking differently: Apply 1 application  topically daily as needed (vaginal yeast).) 30 g 2   pantoprazole (PROTONIX) 40 MG tablet Take 40 mg by mouth 2 (two) times daily.     pentosan polysulfate (ELMIRON) 100 MG capsule Take 1 capsule (100 mg total) by mouth 3 (three) times daily. Reported on 01/05/2016 (Patient taking differently: Take 100 mg by mouth daily as needed (burning and pain). Reported on 01/05/2016) 90  capsule 11   polyethylene glycol (MIRALAX) 17 g packet Take 17 g by mouth 2 (two) times daily. 14 each 0   sucralfate (CARAFATE) 1 GM/10ML suspension Take 10 mLs (1 g total) by mouth 4 (four) times daily -  with meals and at bedtime. 420 mL 1   traMADol (ULTRAM) 50 MG tablet Take 1 tablet by mouth 3 (three) times daily as needed.     traZODone (DESYREL) 150 MG tablet Take 1 tablet (150 mg total) by mouth at bedtime. 30 tablet 3   Vitamin  D, Ergocalciferol, (DRISDOL) 1.25 MG (50000 UNIT) CAPS capsule Take 1 capsule (50,000 Units total) by mouth every 7 (seven) days. 12x Weeks 5 capsule 3   REPATHA SURECLICK 140 MG/ML SOAJ INJECT 140 MG INTO THE SKIN EVERY 14 DAYS 6 mL 0   FLUoxetine (PROZAC) 20 MG capsule Take 1 capsule (20 mg total) by mouth daily. 30 capsule 2   No facility-administered medications prior to visit.    Allergies  Allergen Reactions   Clindamycin Hcl Shortness Of Breath and Rash   Penicillins Anaphylaxis   Rosuvastatin Anaphylaxis   Baclofen Rash   Codeine Hives and Nausea Only   Erythromycin Hives   Latex Hives   Lincomycin Other (See Comments)   Pentazocine Lactate Other (See Comments)    HALLUCINATION   Pneumococcal Vaccine Polyvalent Hives, Swelling and Other (See Comments)    REACTION: swelling at injection site   Prednisone Rash    Other reaction(s): Rash, Hives - can tolerate if she takes with benadryl    Tamoxifen Nausea And Vomiting   Haemophilus Influenzae Other (See Comments)    Local reaction at the site    Haemophilus Influenzae Vaccines Other (See Comments)    Local reaction at site   Levofloxacin     Other reaction(s): sick   Other Other (See Comments)    Father had malignant hyperthermia   Doxycycline Rash   Fluzone [Influenza Virus Vaccine] Hives    Local reaction at the site    Review of Systems  Constitutional:  Positive for malaise/fatigue. Negative for fever.  HENT:  Negative for congestion.   Eyes:  Negative for blurred vision.   Respiratory:  Negative for shortness of breath.   Cardiovascular:  Negative for chest pain, palpitations and leg swelling.  Gastrointestinal:  Negative for abdominal pain, blood in stool and nausea.  Genitourinary:  Negative for dysuria and frequency.  Musculoskeletal:  Positive for joint pain and myalgias. Negative for falls.  Skin:  Negative for rash.  Neurological:  Negative for dizziness, loss of consciousness and headaches.  Endo/Heme/Allergies:  Negative for environmental allergies.  Psychiatric/Behavioral:  Positive for depression. Negative for suicidal ideas. The patient is nervous/anxious.        Objective:    Physical Exam Constitutional:      General: She is not in acute distress.    Appearance: Normal appearance. She is well-developed. She is not toxic-appearing.  HENT:     Head: Normocephalic and atraumatic.     Right Ear: External ear normal.     Left Ear: External ear normal.     Nose: Nose normal.  Eyes:     General:        Right eye: No discharge.        Left eye: No discharge.     Conjunctiva/sclera: Conjunctivae normal.  Neck:     Thyroid: No thyromegaly.  Cardiovascular:     Rate and Rhythm: Normal rate and regular rhythm.     Heart sounds: Normal heart sounds. No murmur heard. Pulmonary:     Effort: Pulmonary effort is normal. No respiratory distress.     Breath sounds: Normal breath sounds.  Abdominal:     General: Bowel sounds are normal.     Palpations: Abdomen is soft.     Tenderness: There is no abdominal tenderness. There is no guarding.  Musculoskeletal:        General: Normal range of motion.     Cervical back: Neck supple.  Lymphadenopathy:     Cervical: No  cervical adenopathy.  Skin:    General: Skin is warm and dry.  Neurological:     Mental Status: She is alert and oriented to person, place, and time.  Psychiatric:        Mood and Affect: Mood normal.        Behavior: Behavior normal.        Thought Content: Thought content  normal.        Judgment: Judgment normal.     BP 120/89 Comment: Pt reported  Pulse 86 Comment: Pt reported Wt Readings from Last 3 Encounters:  03/12/24 125 lb 7.1 oz (56.9 kg)  03/04/24 120 lb (54.4 kg)  02/18/24 125 lb 9.6 oz (57 kg)    Diabetic Foot Exam - Simple   No data filed    Lab Results  Component Value Date   WBC 11.0 (H) 03/04/2024   HGB 14.3 03/04/2024   HCT 45.5 03/04/2024   PLT 264 03/04/2024   GLUCOSE 104 (H) 03/04/2024   CHOL 131 08/28/2023   TRIG 122.0 08/28/2023   HDL 39.40 08/28/2023   LDLDIRECT 74.0 06/12/2023   LDLCALC 67 08/28/2023   ALT 11 03/04/2024   AST 18 03/04/2024   NA 140 03/04/2024   K 4.6 03/04/2024   CL 100 03/04/2024   CREATININE 1.23 (H) 03/04/2024   BUN 17 03/04/2024   CO2 32 03/04/2024   TSH 2.595 03/04/2024   INR 1.05 08/03/2010   HGBA1C 5.5 05/28/2023   MICROALBUR <0.7 08/08/2023    Lab Results  Component Value Date   TSH 2.595 03/04/2024   Lab Results  Component Value Date   WBC 11.0 (H) 03/04/2024   HGB 14.3 03/04/2024   HCT 45.5 03/04/2024   MCV 96.6 03/04/2024   PLT 264 03/04/2024   Lab Results  Component Value Date   NA 140 03/04/2024   K 4.6 03/04/2024   CHLORIDE 102 04/20/2015   CO2 32 03/04/2024   GLUCOSE 104 (H) 03/04/2024   BUN 17 03/04/2024   CREATININE 1.23 (H) 03/04/2024   BILITOT 0.7 03/04/2024   ALKPHOS 107 03/04/2024   AST 18 03/04/2024   ALT 11 03/04/2024   PROT 6.8 03/04/2024   ALBUMIN 4.2 03/04/2024   CALCIUM 9.6 03/04/2024   ANIONGAP 8 03/04/2024   EGFR 50.0 08/15/2023   GFR 60.42 01/11/2024   Lab Results  Component Value Date   CHOL 131 08/28/2023   Lab Results  Component Value Date   HDL 39.40 08/28/2023   Lab Results  Component Value Date   LDLCALC 67 08/28/2023   Lab Results  Component Value Date   TRIG 122.0 08/28/2023   Lab Results  Component Value Date   CHOLHDL 3 08/28/2023   Lab Results  Component Value Date   HGBA1C 5.5 05/28/2023       Assessment  & Plan:  Extrinsic asthma without complication, unspecified asthma severity, unspecified whether persistent Assessment & Plan: No recent exacerbation   Essential hypertension Assessment & Plan: Monitor and report, no changes to meds. Encouraged heart healthy diet such as the DASH diet and exercise as tolerated.     Hyperglycemia Assessment & Plan: hgba1c acceptable, minimize simple carbs. Increase exercise as tolerated.    Hyperlipidemia, unspecified hyperlipidemia type Assessment & Plan: Statin intolerant Encourage heart healthy diet such as MIND or DASH diet, increase exercise, avoid trans fats, simple carbohydrates and processed foods, consider a krill or fish or flaxseed oil cap daily.     Depression with anxiety Assessment & Plan:  Improving with medication adjustments   Vitamin B12 deficiency Assessment & Plan: Supplement and monitor      Assessment and Plan Assessment & Plan Postoperative pain Persistent left ankle pain post-tendon repair, likely due to loose cast from reduced swelling. Manages with acetaminophen and aspirin, declined tramadol. - Clarify weight-bearing status with surgeon. - Follow up with Dr. Ethelene Hal on April 14th.  Chronic pain Generalized joint pain, possible rheumatologic condition. Advised rheumatology consultation. - Contact Dr. Dierdre Forth at [facility] for appointment. - Obtain previous notes and blood work from Dr. Roselle Locus for review.  Depression with anxiety Has not started fluoxetine due to pre-surgery concerns. Plans to start for stress management. Discussed SSRI onset and potential benefits. - Start fluoxetine as planned. - Continue therapy sessions.  Headaches Daily headaches, possibly tension-related. Discussed cognitive behavioral therapy as a management option. - Follow up with Dr. Zella Ball next week for psychiatric evaluation discussion. - Consider cognitive behavioral therapy if headaches persist.     Danise Edge, MD

## 2024-03-20 ENCOUNTER — Ambulatory Visit: Payer: Medicare Other | Admitting: Family Medicine

## 2024-03-20 DIAGNOSIS — S93492A Sprain of other ligament of left ankle, initial encounter: Secondary | ICD-10-CM | POA: Diagnosis not present

## 2024-03-20 DIAGNOSIS — M7672 Peroneal tendinitis, left leg: Secondary | ICD-10-CM | POA: Diagnosis not present

## 2024-03-26 ENCOUNTER — Other Ambulatory Visit: Payer: Self-pay | Admitting: Family

## 2024-03-26 ENCOUNTER — Other Ambulatory Visit (HOSPITAL_COMMUNITY): Payer: Self-pay

## 2024-03-26 ENCOUNTER — Telehealth: Payer: Self-pay | Admitting: Family Medicine

## 2024-03-26 ENCOUNTER — Telehealth: Payer: Self-pay

## 2024-03-26 MED ORDER — TRAZODONE HCL 100 MG PO TABS: 100.0000 mg | ORAL_TABLET | Freq: Every day | ORAL | 0 refills | Status: DC

## 2024-03-26 NOTE — Telephone Encounter (Signed)
 Copied from CRM [id number]. Topic: Clinical - Medication Question >> Mar 26, 2024  3:04 PM Abigail D wrote: Reason for CRM: Medication: traZODone (DESYREL) 150 MG tablet  PT originally did 150MG  but dose was too high, wants to go back to 100MG .  PT is currently out of the 150mg  tablets that she was taking. She states that the 150mg  was too strong for her and knocked her out at night, she says she had been taking them cut in half but would overall prefer the medication dose to be lowered.

## 2024-03-26 NOTE — Telephone Encounter (Signed)
 Pharmacy Patient Advocate Encounter   Received notification from CoverMyMeds that prior authorization for Repatha SureClick 140MG /ML auto-injectors is required/requested.   Insurance verification completed.   The patient is insured through [facility] ADVANTAGE/RX ADVANCE .   Per test claim: PA required; PA submitted to above mentioned insurance via CoverMyMeds Key/confirmation #/EOC [id number] Status is pending

## 2024-03-27 ENCOUNTER — Ambulatory Visit: Payer: Medicare Other | Admitting: Physician Assistant

## 2024-03-27 ENCOUNTER — Other Ambulatory Visit (HOSPITAL_COMMUNITY): Payer: Self-pay

## 2024-03-27 NOTE — Telephone Encounter (Signed)
Rx sent by [facility].

## 2024-03-27 NOTE — Telephone Encounter (Signed)
 Additional information has been requested from the patient's insurance in order to proceed with the prior authorization request. Requested information has been sent, or form has been filled out and faxed back to [phone]

## 2024-04-03 DIAGNOSIS — Z79899 Other long term (current) drug therapy: Secondary | ICD-10-CM | POA: Diagnosis not present

## 2024-04-03 DIAGNOSIS — M1990 Unspecified osteoarthritis, unspecified site: Secondary | ICD-10-CM | POA: Diagnosis not present

## 2024-04-03 DIAGNOSIS — M810 Age-related osteoporosis without current pathological fracture: Secondary | ICD-10-CM | POA: Diagnosis not present

## 2024-04-03 DIAGNOSIS — N189 Chronic kidney disease, unspecified: Secondary | ICD-10-CM | POA: Diagnosis not present

## 2024-04-03 DIAGNOSIS — M797 Fibromyalgia: Secondary | ICD-10-CM | POA: Diagnosis not present

## 2024-04-03 DIAGNOSIS — M79606 Pain in leg, unspecified: Secondary | ICD-10-CM | POA: Diagnosis not present

## 2024-04-03 DIAGNOSIS — M3501 Sicca syndrome with keratoconjunctivitis: Secondary | ICD-10-CM | POA: Diagnosis not present

## 2024-04-03 DIAGNOSIS — M898X9 Other specified disorders of bone, unspecified site: Secondary | ICD-10-CM | POA: Diagnosis not present

## 2024-04-03 DIAGNOSIS — M353 Polymyalgia rheumatica: Secondary | ICD-10-CM | POA: Diagnosis not present

## 2024-04-03 DIAGNOSIS — M25561 Pain in right knee: Secondary | ICD-10-CM | POA: Diagnosis not present

## 2024-04-03 LAB — HM DEXA SCAN

## 2024-04-07 ENCOUNTER — Other Ambulatory Visit: Payer: Self-pay | Admitting: Family Medicine

## 2024-04-07 LAB — LAB REPORT - SCANNED: EGFR (Non-African Amer.): 68

## 2024-04-08 ENCOUNTER — Encounter: Payer: Self-pay | Admitting: Professional

## 2024-04-08 ENCOUNTER — Ambulatory Visit (INDEPENDENT_AMBULATORY_CARE_PROVIDER_SITE_OTHER): Admitting: Professional

## 2024-04-08 ENCOUNTER — Other Ambulatory Visit: Payer: Self-pay | Admitting: Family Medicine

## 2024-04-08 DIAGNOSIS — F411 Generalized anxiety disorder: Secondary | ICD-10-CM

## 2024-04-08 DIAGNOSIS — F331 Major depressive disorder, recurrent, moderate: Secondary | ICD-10-CM | POA: Diagnosis not present

## 2024-04-08 DIAGNOSIS — F4310 Post-traumatic stress disorder, unspecified: Secondary | ICD-10-CM

## 2024-04-08 MED ORDER — DULOXETINE HCL 60 MG PO CPEP: 60.0000 mg | ORAL_CAPSULE | Freq: Every morning | ORAL | 1 refills | Status: DC

## 2024-04-08 NOTE — Progress Notes (Signed)
 [facility] Counselor/Therapist Progress Note  Patient ID: [name], MRN: [id number],    Date: 04/08/2024  Time Spent: 59 minutes 1001-11am   Treatment Type: Individual Therapy  Risk Assessment: Danger to Self:  No Self-injurious Behavior: No Danger to Others: No  Subjective: This session was held via video teletherapy. The patient consented to video teletherapy and was located in her home during this session. She is aware it is the responsibility of the patient to secure confidentiality on her end of the session. The provider was in a private home office for the duration of this session.    The patient arrived on time for the [name] appointment.   Issues addressed: 1-mood a-bright and easily engaged b-tearful when thinks about lost opportunities 2-sensitive to criticism -feels attacked when someone gives her feedback -pt has great insight now that she has participated in therapy -she identified the reason she was so meticulous about her appearance while she was crumbling on the inside -thankful for raising daughters that have their own opinions -pt was closer with her father and feared going to her mother -pt would get angry with mother because she wrote the pt's speeches for her church responsibilities -pt has been more transparent and has apologized to her girls for things she regretted in her parenting 3-physical a-had her foot surgery -continues to experience extreme pain  Treatment Plan:   Goals Alleviate depressive symptoms Recognize, accept, and cope with depressive feelings Develop healthy thinking patterns Develop healthy interpersonal relationships (particularly younger daughter) Reduce overall frequency, intensity, and duration of anxiety Stabilize anxiety level while increasing ability to function Enhance ability to effectively cope with full variety of stressors Learn and implement coping skills that result in a reduction of anxiety     Objectives target date for all objectives is 09/26/2024 Verbalize an understanding of the cognitive, physiological, and behavioral components of anxiety    60 Learning and implement calming skills to reduce overall anxiety         60 Verbalize an understanding of the role that cognitive biases play in excessive irrational worry and persistent anxiety symptoms 70 Identify, challenge, and replace biased fearful self-talk  60 Learn and implement problem solving strategies   70 Identify and engage in pleasant activities    80 Learning and implement personal and interpersonal skills to reduce anxiety and improve interpersonal relationships   60 Learn to accept limitations in life and commit to tolerating, rather than avoiding, unpleasant emotions while accomplishing meaningful goal 60 Identify major life conflicts from the past and present that form the basis for present anxiety      70 Maintain involvement in work, family, and social activities 70 Reestablish a consistent sleep-wake cycle   50 Verbalize an accurate understanding of depression  60 Identify and replace thoughts that support depression  50 Learn and implement behavioral strategies   60 Verbalize an understanding and resolution of current interpersonal problems       30 Learn and implement decision making skills   38 Learn and implement conflict resolution skills to resolve interpersonal problems       60 Verbalize an understanding of healthy and unhealthy emotions verbalize insight into how past relationships may be influence current experiences with depression     60 Use mindfulness and acceptance strategies and increase value based behavior        60 Increase hopeful statements about the future.    60 Interventions Engage the patient in behavioral activation Use instruction, modeling, and  role-playing to build the patient 's general social, communication, and/or conflict resolution skills Use Acceptance and Commitment Therapy to  help patient  accept uncomfortable realities in order to accomplish value-consistent goals Reinforce the patient 's insight into the role of her past emotional pain and present anxiety  Support the patient in following through with work, family, and social activities Teach and implement sleep hygiene practices  Teach the patient relaxation skills Assign the patient homework Discuss examples demonstrating that unrealistic worry overestimates the probability of threats and underestimates patient's ability  Assist the patient in analyzing his or her worries Help patient understand that avoidance is reinforcing  Consistent with treatment model, discuss how change in cognitive, behavioral, and interpersonal can help patient alleviate depression CBT Behavioral activation to help the patient explore the relationship, nature of the dispute Help the patient develop new interpersonal skills and relationships Conduct Problem solving therapy Teach conflict resolution skills Use a process-experiential approach Conduct ACT  Diagnosis:Major depressive disorder, recurrent episode, moderate (HCC)  Generalized anxiety disorder  PTSD (post-traumatic stress disorder)  Plan:  -meet on Wednesday, Apr 30, 2024 at [employer].

## 2024-04-08 NOTE — Telephone Encounter (Signed)
 Copied from CRM [id number]. Topic: Clinical - Medication Refill >> Apr 08, 2024 12:46 PM Marlan Silva wrote: Most Recent Primary Care Visit:  Provider: Randie Bustle A  Department: [facility]  Visit Type: MYCHART VIDEO VISIT  Date: 03/18/2024  Medication: DULoxetine (CYMBALTA) 60 MG capsule  Has the patient contacted their pharmacy? Yes (Agent: If no, request that the patient contact the pharmacy for the refill. If patient does not wish to contact the pharmacy document the reason why and proceed with request.) (Agent: If yes, when and what did the pharmacy advise?)  Is this the correct pharmacy for this prescription? Yes If no, delete pharmacy and type the correct one.  This is the patient's preferred pharmacy:  [facility] [address], [place] - [address] [place] [address] Phone: [phone] Fax: [phone]   Has the prescription been filled recently? Yes  Is the patient out of the medication? Yes  Has the patient been seen for an appointment in the last year OR does the patient have an upcoming appointment? Yes  Can we respond through MyChart? Yes  Agent: Please be advised that Rx refills may take up to 3 business days. We ask that you follow-up with your pharmacy.

## 2024-04-09 ENCOUNTER — Ambulatory Visit: Admitting: Physician Assistant

## 2024-04-11 ENCOUNTER — Other Ambulatory Visit: Payer: Self-pay | Admitting: Family Medicine

## 2024-04-11 NOTE — Telephone Encounter (Signed)
 Copied from CRM [id number]. Topic: Clinical - Medication Refill >> Apr 11, 2024  3:20 PM Keitha Pata L wrote: Most Recent Primary Care Visit:  Provider: Randie Bustle A  Department: [facility]  Visit Type: MYCHART VIDEO VISIT  Date: 03/18/2024  Medication: DULoxetine  (CYMBALTA ) 60 MG capsule  Has the patient contacted their pharmacy? Yes (Agent: If no, request that the patient contact the pharmacy for the refill. If patient does not wish to contact the pharmacy document the reason why and proceed with request.) (Agent: If yes, when and what did the pharmacy advise?)  Is this the correct pharmacy for this prescription? Yes If no, delete pharmacy and type the correct one.  This is the patient's preferred pharmacy:  [facility] [address], [place] - [address] [place] [address] Phone: [phone] Fax: [phone]   Has the prescription been filled recently? Yes  Is the patient out of the medication? Yes  Has the patient been seen for an appointment in the last year OR does the patient have an upcoming appointment? Yes  Can we respond through MyChart? Yes  Agent: Please be advised that Rx refills may take up to 3 business days. We ask that you follow-up with your pharmacy.

## 2024-04-14 ENCOUNTER — Ambulatory Visit (INDEPENDENT_AMBULATORY_CARE_PROVIDER_SITE_OTHER): Admitting: Family Medicine

## 2024-04-14 ENCOUNTER — Encounter: Payer: Self-pay | Admitting: Family Medicine

## 2024-04-14 VITALS — BP 127/57 | HR 80 | Ht 64.5 in | Wt 129.0 lb

## 2024-04-14 DIAGNOSIS — M797 Fibromyalgia: Secondary | ICD-10-CM

## 2024-04-14 DIAGNOSIS — H9312 Tinnitus, left ear: Secondary | ICD-10-CM

## 2024-04-14 MED ORDER — DULOXETINE HCL 60 MG PO CPEP: 60.0000 mg | ORAL_CAPSULE | Freq: Two times a day (BID) | ORAL | 1 refills | Status: DC

## 2024-04-14 NOTE — Progress Notes (Signed)
 Acute Office Visit  Subjective:     Patient ID: [name], female    DOB: 12/22/1946, 78 y.o.   MRN: [id number]  Chief Complaint  Patient presents with   Ear Problem    HPI Patient is in today for left ear ringing/discomfort.    Discussed the use of AI scribe software for clinical note transcription with the patient, who gave verbal consent to proceed.  History of Present Illness She is a 78 year old female who presents with persistent left ear pinging and dizziness.  She has been experiencing a persistent 'pinging' sound in her left ear for approximately two weeks. Initially intermittent, the sound has become constant and louder, causing significant distress. She describes it as similar to 'aluminum foil moving' and notes a scratchy feeling, but denies any ear discharge. There is soreness behind the left ear.  Months ago, she hit her head on a microwave, resulting in a black eye and a sensation of something cracking. No x-rays were performed at the time, and she was advised to see an ENT specialist but was unable to due to illness.  She has been using Flonase  nasal spray intermittently, which she feels has helped with her symptoms. She also takes antihistamines.  She has been on Cymbalta  for three to four years, taking 60 mg twice daily. Recently, there was a mix-up with her prescription and she needs be to renew the BID dosing.          ROS All review of systems negative except what is listed in the HPI      Objective:    BP (!) 127/57   Pulse 80   Ht 5' 4.5" (1.638 m)   Wt 129 lb (58.5 kg) Comment: has full boot on left foot  SpO2 97%   BMI 21.80 kg/m    Physical Exam Vitals reviewed.  Constitutional:      Appearance: Normal appearance.  HENT:     Head: Normocephalic and atraumatic.     Right Ear: Tympanic membrane, ear canal and external ear normal. There is no impacted cerumen.     Left Ear: Tympanic membrane, ear canal and external ear normal.  There is no impacted cerumen.  Neurological:     Mental Status: She is alert and oriented to person, place, and time.  Psychiatric:        Mood and Affect: Mood normal.        Behavior: Behavior normal.        Thought Content: Thought content normal.        Judgment: Judgment normal.        No results found for any visits on 04/14/24.      Assessment & Plan:   Problem List Items Addressed This Visit       Active Problems   Fibromyalgia   Relevant Medications   DULoxetine  (CYMBALTA ) 60 MG capsule   Other Visit Diagnoses       Ringing in left ear    -  Primary   Relevant Orders   Ambulatory referral to ENT      Assessment & Plan Tinnitus Persistent left ear 'pinging' with aching  - Refer to ENT for evaluation and testing. - Advise daily Flonase  nasal spray.  Fibromyalgia Chronic pain managed with Cymbalta . Resolved prescription refill issue.  - Ensure Cymbalta  60 mg twice daily is filled correctly.      Meds ordered this encounter  Medications   DULoxetine  (CYMBALTA ) 60 MG capsule    Sig:  Take 1 capsule (60 mg total) by mouth 2 (two) times daily.    Dispense:  180 capsule    Refill:  1    Supervising Provider:   Randie Bustle A [[id number]]    Return if symptoms worsen or fail to improve.  Everlina Hock, NP

## 2024-04-15 ENCOUNTER — Ambulatory Visit: Admitting: Physician Assistant

## 2024-04-15 ENCOUNTER — Telehealth: Payer: Self-pay

## 2024-04-15 NOTE — Telephone Encounter (Signed)
 Looks like [facility] was having trouble processing most recent [employer] for her Repatha . Called [facility] and provided most recent ID number - Pharmacist reran and cost was $0.  Patient notified by VM.

## 2024-04-15 NOTE — Telephone Encounter (Signed)
 Copied from CRM [id number]. Topic: Clinical - Medication Refill >> Apr 15, 2024 11:30 AM Dimple Francis wrote: Reason for CRM: patient calling to speak to a pharmacist about her refill for Repatha 

## 2024-04-16 ENCOUNTER — Ambulatory Visit

## 2024-04-17 ENCOUNTER — Ambulatory Visit: Payer: HMO | Admitting: Gastroenterology

## 2024-04-17 ENCOUNTER — Encounter: Payer: Self-pay | Admitting: Gastroenterology

## 2024-04-17 VITALS — BP 124/68 | Ht 64.5 in | Wt 125.0 lb

## 2024-04-17 DIAGNOSIS — R1310 Dysphagia, unspecified: Secondary | ICD-10-CM | POA: Diagnosis not present

## 2024-04-17 DIAGNOSIS — D509 Iron deficiency anemia, unspecified: Secondary | ICD-10-CM

## 2024-04-17 DIAGNOSIS — K219 Gastro-esophageal reflux disease without esophagitis: Secondary | ICD-10-CM

## 2024-04-17 DIAGNOSIS — R140 Abdominal distension (gaseous): Secondary | ICD-10-CM

## 2024-04-17 DIAGNOSIS — R948 Abnormal results of function studies of other organs and systems: Secondary | ICD-10-CM

## 2024-04-17 DIAGNOSIS — K5909 Other constipation: Secondary | ICD-10-CM

## 2024-04-17 MED ORDER — VOQUEZNA 10 MG PO TABS: 1.0000 | ORAL_TABLET | Freq: Every day | ORAL | Status: DC

## 2024-04-17 MED ORDER — LINACLOTIDE 72 MCG PO CAPS: 72.0000 ug | ORAL_CAPSULE | Freq: Every day | ORAL | Status: DC

## 2024-04-17 MED ORDER — NA SULFATE-K SULFATE-MG SULF 17.5-3.13-1.6 GM/177ML PO SOLN: 1.0000 | Freq: Once | ORAL | 0 refills | Status: AC

## 2024-04-17 NOTE — Patient Instructions (Signed)
 Start Linzess  72 mcg (2) capsules by mouth daily to equal 145 mcg daily. Let our office know if this works better then the Linzess  290 mcg and we can send in a prescription for Linzess  145 mcg daily.   Also, Start Voquezna  samples taking one capsule by mouth daily x 15 days.   Stop iron supplements until after procedures.  You have been scheduled for an endoscopy and colonoscopy. Please follow the written instructions given to you at your visit today.  If you use inhalers (even only as needed), please bring them with you on the day of your procedure.  DO NOT TAKE 7 DAYS PRIOR TO TEST- Trulicity (dulaglutide) Ozempic, Wegovy (semaglutide) Mounjaro (tirzepatide) Bydureon Bcise (exanatide extended release)  DO NOT TAKE 1 DAY PRIOR TO YOUR TEST Rybelsus (semaglutide) Adlyxin (lixisenatide) Victoza (liraglutide) Byetta (exanatide) ___________________________________________________________________________    If your blood pressure at your visit was 140/90 or greater, please contact your primary care physician to follow up on this.  _______________________________________________________  If you are age 79 or older, your body mass index should be between 23-30. Your Body mass index is 21.12 kg/m. If this is out of the aforementioned range listed, please consider follow up with your Primary Care Provider.  If you are age 16 or younger, your body mass index should be between 19-25. Your Body mass index is 21.12 kg/m. If this is out of the aformentioned range listed, please consider follow up with your Primary Care Provider.   ________________________________________________________  The [facility] providers would like to encourage you to use MYCHART to communicate with providers for non-urgent requests or questions.  Due to long hold times on the telephone, sending your provider a message by [facility] may be a faster and more efficient way to get a response.  Please allow 48 business hours  for a response.  Please remember that this is for non-urgent requests.  _______________________________________________________

## 2024-04-17 NOTE — Progress Notes (Signed)
 HPI :  78 year old female here for a follow-up visit.  Recall she has a history of chronic constipation, suspected IBS, chronic abdominal discomfort. She is also has a history of COPD, CAD, a flutter, breast cancer status post right breast lumpectomy and radiation in 2013, interstitial cystitis.  I have seen her in the past for altered bowel habits.  She has been on a variety of regimens to treat her constipation as well as been seen by pelvic floor PT which has provided some benefit for her in the past.  She was last seen in end of January by Maureen Sour.  Thought to have some overflow related bowel changes based on x-ray and clinical course.  She went through a bowel prep and was told to take MiraLAX  more frequently.  That has provided some benefit to her however her bowels remain altered.  She is currently taking MiraLAX  twice daily, usually 4 to 5 days/week.  She does not like the taste of it and has a hard time being compliant with it.  She had similar this taste from fiber supplement/Metamucil in the past.  Despite taking MiraLAX  twice daily most days, she continues to have some constipation.  Some days she will have a bowel movement the morning, other times she may take 4 to 5 days to move her bowels.  She feels bloating and gas with abdominal distention with this.  Additionally, she has been taking oral iron supplement for the past several months.  On review of her chart she developed an iron deficiency this past August 2024.  Hemoglobin went from 12's to 11.3, ferritin was found to be 8 with an iron level of 27.  She was placed on oral iron, she feels she has been sensitive to that and does not take it every day but does take it several days per week.  She had labs drawn on March 11 and her hemoglobin is normal at 14.3, iron studies also normalized.  She is not seeing any blood in her stool.  She does remind me that she has had a history of dysphagia in the past as well as reflux and has required  dilation of her esophagus which has shown some dilation effect just inferior to the UES.  She last had an EGD in March 2023.  She responded to dilation with 17 mm Savary dilator.  Of note she also had colonoscopy at that time without any concerning pathology.  Took significant amount of lavage to clear her colon.  These exams did not show any concerning pathology in regards to her iron deficiency which happened a year and a half later.  She has been taking pantoprazole  40 mg twice daily for reflux.  She states despite taking this she still has symptoms that bother her.  She has been using Pepcid  as needed and that can help her when she takes it.  She is been followed by Dr. Maria Shiner for her history of cancer and iron deficiency.  He did a PET scan for her this February.  There was a small focus of uptake at the IC valve, unclear if physiologic and related to muscular activity at the valve versus adenoma/carcinoma.  Patient is anxious about this, we discussed if she wanted to pursue further endoscopic evaluation for these issues.     PAST GI PROCEDURES:    EGD 12/24/2018:  - Esophagogastric landmarks identified. - Normal esophagus - empiric dilation performed to 17mm with small mucosal wrent at the UES - Erythematous mucosa in  the gastric body. - Normal duodenal bulb and second portion of the duodenum. - Biopsies were taken with a cold forceps for Helicobacter pylori testing.   Colonoscopy 12/24/2018: - One 4 mm polyp in the ascending colon, removed with a cold snare. Resected and retrieved. - One 4 mm polyp in the transverse colon, removed with a cold snare. Resected and retrieved. - One 4 mm polyp in the descending colon, removed with a cold snare. Resected and retrieved. - One 3 mm polyp in the sigmoid colon, removed with a cold snare. Resected and retrieved. - Anal papilla(e) were hypertrophied. Biopsied. - Tortuous colon, which prohibited ileal intubation. - Internal hemorrhoids. -  Mostly adequate prep, fair in the cecum. - The examination was otherwise normal. No evidence of IBD or fistula. Suspect rectal bleeding is due to hemorrhoids in the setting of constipation. -3-year colonoscopy recall to be considered as the patient would be 75 at that point   1. Surgical [P], gastric antrum and gastric body - MILD REACTIVE GASTROPATHY. - NEGATIVE FOR HELICOBACTER PYLORI. - NO INTESTINAL METAPLASIA, DYSPLASIA, OR MALIGNANCY. 2. Surgical [P], transverse, ascending, sigmoid, polyp (2) - TUBULAR ADENOMA. - SESSILE SERRATED POLYP WITHOUT DYSPLASIA (X2 FRAGMENTS). - NO HIGH GRADE DYSPLASIA OR MALIGNANCY. 3. Surgical [P], anal papilla, polyp - POLYPOID FRAGMENT OF BENIGN SQUAMOUS MUCOSA. - NO DYSPLASIA OR MALIGNANCY.     EGD 03/22/22: - Esophagogastric landmarks were identified: the Z-line was found at 39 cm, the gastroesophageal junction was found at 39 cm and the upper extent of the gastric folds was found at 40 cm from the incisors. Findings: - A 1 cm hiatal hernia was present. - The exam of the esophagus was otherwise normal. - A guidewire was placed and the scope was withdrawn. Empiric dilation was performed in the entire esophagus with a Savary dilator with mild resistance at 15 mm and then 17 mm (initially passage of 17mm felt tight, so started with 15mm first). Relook endoscopy showed appropriate small mucosal wrent just inferior to the UES. - The entire examined stomach was normal. - The duodenal bulb and second portion of the duodenum were normal.   Colonoscopy 03/02/22: - The perianal and digital rectal examinations were normal. - A large amount of semi-liquid stool was found in the ascending colon and in the cecum, making visualization difficult initially. Lavage of the area was performed using copious amounts of sterile water , resulting in clearance with adequate visualization. - Anal papilla(e) were hypertrophied. - Internal hemorrhoids were found during retroflexion. The  hemorrhoids were small. - The colon was tortuous with looping. Cecal intubation was technically challenging. - The exam was otherwise without abnormality. No overt evidence of fistula in the colon. No inflammatory changes - Biopsies for histology were taken with a cold forceps from the right colon, left colon and transverse colon for evaluation of microscopic colitis.     CT abdomen / pelvis 02/27/2023: IMPRESSION: 3 mm proximal left ureteral calculus, without hydronephrosis. No other acute findings.   Aortic Atherosclerosis (ICD10-I70.0).     PET scan 01/21/24: IMPRESSION: 1. No evidence for hypermetabolic malignancy on today's study. 2. Small focus of FDG accumulation in the colon above background colonic levels. This maps to the region of the ileocecal valve and may be related to muscular activity in the valve. Adenoma or carcinoma could present similarly. No obvious colonic mass lesion at this location by CT imaging. Correlation with colorectal cancer screening history recommended. 3. 2 mm nonobstructing stone lower pole left kidney. 4. Small left  groin hernia contains only fat. 5.  Aortic Atherosclerosis (ICD10-I70.0).    Past Medical History:  Diagnosis Date   Abdominal pain 10/26/2013   Allergic rhinitis 01/25/2016   ANA positive 07/29/2013   Anemia    Arthralgia 08/18/2013   Arthritis    Asthma, allergic 08/05/2012   Atrial flutter    past history- not current   Atypical chest pain 01/01/2015   Bilateral carpal tunnel syndrome 03/04/2015   CAD in native artery 08/25/2010   Minor CAD 2009, 2011, low risk Myoview  2013 and 2016        Cataract    bilaterally removed    Cellulitis of breast 02/05/2023   Chronic constipation    Chronic cough 01/09/2017   Chronic interstitial cystitis 02/01/2017   Chronic night sweats 01/08/2015   Common bile duct dilatation 02/13/2023   Concussion    x 3   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   DDD (degenerative disc disease), cervical  09/17/2018   Degenerative scoliosis 04/22/2018   Deviated septum 05/12/2016   Dysphagia    Dyspnea    Dysuria 03/09/2021   Elevated alkaline phosphatase level 01/25/2023   Essential hypertension 08/25/2010   External hemorrhoids 02/05/2015   Facial trauma 01/22/2023   Family history of adverse reaction to anesthesia    Family history of malignant hyperthermia    father had this   Fibrocystic breast disease 10/18/2010   Fibromyalgia    Frequency of urination    Generalized anxiety disorder 08/03/2010   GERD (gastroesophageal reflux disease)    Headache 10/13/2013   High serum vitamin D  05/18/2021   History of chronic bronchitis    History of colonic polyp 02/25/2019   History of hiatal hernia    History of tachycardia    CONTROLLED  WITH ATENOLOL    Hyperglycemia 03/08/2021   Hyperkalemia 09/28/2022   Hyperlipidemia    Intertrigo 12/13/2022   Kidney stone 02/26/2019   Knee pain, right 04/30/2015   Left hip pain 04/30/2015   Leg pain 02/24/2011   Qualifier: Diagnosis of   By: Jalene Mayor DOAdel Holt         Leukocytosis 07/05/2022   Local reaction to hymenoptera sting 05/09/2023   Low back pain 09/08/2020   Major depressive disorder 02/05/2013   Malignant neoplasm of lower-outer quadrant of right breast of female, estrogen receptor positive 01/03/2013   Nasal turbinate hypertrophy 05/12/2016   Oral herpes 02/05/2023   OSA and COPD overlap syndrome 06/10/2020   Osteoporosis 01/2019   T score -2.2 stable/improved from prior study   Pelvic pain    Peripheral neuropathy 05/22/2014   Personal history of radiation therapy    Pneumonia    Primary insomnia 06/25/2017   Pulmonary nodules 02/12/2015   Rash and other nonspecific skin eruption 05/09/2023   Recurrent falls 05/02/2020   Recurrent sinusitis 02/25/2019   Right arm pain 07/20/2016   RLS (restless legs syndrome) 11/09/2019   RUQ pain 07/05/2022   S/P radiation therapy 11/12/12 - 12/05/12   right Breast   Sciatica  04/30/2015   Sepsis 2014   from UTI    Sjogren's disease 12/12/2021   Splenic lesion 09/02/2012   Sprain of lateral ligament of ankle joint 07/31/2023   Stage 3 chronic kidney disease    Statin intolerance 02/29/2020   Stricture and stenosis of esophagus 10/19/2011   Tongue lesion 02/05/2023   Tremor 07/05/2022   Urgency of urination    Vertigo 01/22/2023   Vitamin B12 deficiency 03/08/2021   Vocal cord cyst  05/18/2021     Past Surgical History:  Procedure Laterality Date   55 HOUR PH STUDY N/A 04/03/2016   Procedure: 24 HOUR PH STUDY;  Surgeon: Danette Duos, MD;  Location: [facility];  Service: Gastroenterology;  Laterality: N/A;   ANKLE ARTHROSCOPY WITH RECONSTRUCTION Left 03/12/2024   Procedure: ANKLE ARTHROSCOPY WITH OPEN LATERAL ANKLE LIGAMENT STABILIZATION;  Surgeon: Ali Ink, MD;  Location: [facility];  Service: Orthopedics;  Laterality: Left;   BREAST BIOPSY Right 08/23/2012   ADH   BREAST BIOPSY Right 10/11/2012   Ductal Carcinoma   BREAST EXCISIONAL BIOPSY Right 09/04/2013   benign   BREAST LUMPECTOMY Right 10/11/2012   W/ [facility] BX   CARDIAC CATHETERIZATION  09-13-2007  DR [name] Null   WELL-PRESERVED LVF/  DIFFUSE SCATTERED CORONARY CALCIFACATION AND ATHEROSCLEROSIS WITHOUT OBSTRUCTION   CARDIAC CATHETERIZATION  08-04-2010  DR MCALHANY   NON-OBSTRUCTIVE CAD/  pLAD 40%/  oLAD 30%/  mLAD 30%/  pRCA 30%/  EF 60%   CARDIOVASCULAR STRESS TEST  06-18-2012  DR McALHANY   LOW RISK NUCLEAR STUDY/  SMALL FIXED AREA OF MODERATELY DECREASED UPTAKE IN ANTEROSEPTAL WALL WHICH MAY BE ARTIFACTUAL/  NO ISCHEMIA/  EF 68%   COLONOSCOPY  09/29/2010   CYSTOSCOPY     CYSTOSCOPY WITH HYDRODISTENSION AND BIOPSY N/A 03/06/2014   Procedure: CYSTOSCOPY/HYDRODISTENSION/ INSTILATION OF MARCAINE  AND PYRIDIUM ;  Surgeon: Edmund Gouge, MD;  Location: [facility];  Service: Urology;  Laterality: N/A;   CYSTOSCOPY/URETEROSCOPY/HOLMIUM LASER/STENT  PLACEMENT Left 03/21/2023   Procedure: CYSTOSCOPY LEFT RETROGRADE PYELOGRAM, LEFT URETEROSCOPY, HOLMIUM LASER LITHOTRIPSYM, AND LEFT URETERAL STENT PLACEMENT;  Surgeon: Adelbert Homans, MD;  Location: [facility];  Service: Urology;  Laterality: Left;  60 MINUTES   ESOPHAGEAL MANOMETRY N/A 04/03/2016   Procedure: ESOPHAGEAL MANOMETRY (EM);  Surgeon: Danette Duos, MD;  Location: [facility];  Service: Gastroenterology;  Laterality: N/A;   EXTRACORPOREAL SHOCK WAVE LITHOTRIPSY Left 02/06/2019   Procedure: EXTRACORPOREAL SHOCK WAVE LITHOTRIPSY (ESWL);  Surgeon: Ottelin, Mark, MD;  Location: [facility];  Service: Urology;  Laterality: Left;   NASAL SINUS SURGERY  1985   ORIF RIGHT ANKLE  FX  2006   POLYPECTOMY     REMOVAL VOCAL CORD CYST  01/2013   REPAIR OF PERONEUS BREVIS TENDON Left 03/12/2024   Procedure: REPAIR OF PERONEUS TENDON;  Surgeon: Ali Ink, MD;  Location: [facility];  Service: Orthopedics;  Laterality: Left;   RIGHT BREAST BX  08/23/2012   RIGHT HAND SURGERY  X3  LAST ONE 2009   INCLUDES  ORIF RIGHT 5TH FINGER AND REVISION TWICE   SKIN CANCER EXCISION     face,arms   TONSILLECTOMY AND ADENOIDECTOMY  AGE 79   TOTAL ABDOMINAL HYSTERECTOMY W/ BILATERAL SALPINGOOPHORECTOMY  1982   W/  APPENDECTOMY   TRANSTHORACIC ECHOCARDIOGRAM  06/24/2012   GRADE I DIASTOLIC DYSFUNCTION/  EF 55-60%/  MILD MR   UPPER GASTROINTESTINAL ENDOSCOPY     Family History  Problem Relation Age of Onset   Rectal cancer Mother    Colon cancer Mother    Pancreatic cancer Mother    Diabetes Mother    Breast cancer Mother 32   Breast cancer Maternal Aunt        breast   Birth defects Maternal Aunt    Irritable bowel syndrome Son    Heart disease Son        CAD, MV replacement   Allergic Disorder Daughter    Diabetes Daughter    Colon  cancer Father    Colon polyps Father    Diabetes Father    Stroke Father    Heart disease Father        CHF   Hyperlipidemia  Father    Hypertension Father    Arthritis Father    Breast cancer Paternal Aunt    Breast cancer Paternal Aunt    Arthritis Paternal Uncle    Allergic Disorder Daughter    Heart disease Cousin        CAD, had a blood clot after stenting   Esophageal cancer Neg Hx    Stomach cancer Neg Hx    Social History   Tobacco Use   Smoking status: Former    Current packs/day: 0.00    Average packs/day: 1 pack/day for 15.0 years (15.0 ttl pk-yrs)    Types: Cigarettes    Start date: 05/27/1990    Quit date: 05/27/2005    Years since quitting: 18.9    Passive exposure: Never   Smokeless tobacco: Never  Vaping Use   Vaping status: Never Used  Substance Use Topics   Alcohol use: No    Alcohol/week: 0.0 standard drinks of alcohol   Drug use: No   Current Outpatient Medications  Medication Sig Dispense Refill   acetaminophen  (TYLENOL ) 500 MG tablet Take 1,000 mg by mouth every 6 (six) hours as needed for mild pain or headache.      albuterol  (VENTOLIN  HFA) 108 (90 Base) MCG/ACT inhaler Inhale 2 puffs into the lungs every 4 (four) hours as needed for wheezing or shortness of breath (coughing fits). 18 g 1   amLODipine  (NORVASC ) 5 MG tablet Take 5 mg by mouth daily.     aspirin  EC 81 MG tablet Take 1 tablet (81 mg total) by mouth daily. 90 tablet 3   atenolol  (TENORMIN ) 25 MG tablet Take 1 tablet (25 mg total) by mouth 2 (two) times daily. Take 1 tablet by mouth 2 time a day 180 tablet 3   carboxymethylcellul-glycerin (OPTIVE) 0.5-0.9 % ophthalmic solution Place 1 drop into both eyes daily as needed for dry eyes.     Cholecalciferol  (VITAMIN D3) 50 MCG (2000 UT) CAPS Take 2,000 Units by mouth daily.     cyanocobalamin  (VITAMIN B12) 1000 MCG tablet Take 1,000 mg by mouth daily with breakfast.     diclofenac Sodium (VOLTAREN) 1 % GEL Apply 2 g topically daily as needed (Back pain).     DULoxetine  (CYMBALTA ) 60 MG capsule Take 1 capsule (60 mg total) by mouth 2 (two) times daily. 180 capsule 1    EPINEPHrine  0.3 mg/0.3 mL IJ SOAJ injection Inject 0.3 mg into the muscle as needed for anaphylaxis. 2 each 1   Evolocumab  (REPATHA  SURECLICK) 140 MG/ML SOAJ Inject 140 mg into the skin every 14 (fourteen) days. 6 mL 0   famotidine  (PEPCID ) 20 MG tablet TAKE 1 TABLET BY MOUTH TWICE DAILY AS NEEDED FOR  HEARTBURN  OR  INDIGESTION 60 tablet 0   FLUoxetine  (PROZAC ) 20 MG capsule Take 1 capsule (20 mg total) by mouth daily. 30 capsule 2   fluticasone  (FLONASE ) 50 MCG/ACT nasal spray Use 2 spray(s) in each nostril once daily (Patient taking differently: Place 2 sprays into both nostrils daily as needed for allergies or rhinitis.) 16 g 5   fluticasone  (FLOVENT  HFA) 110 MCG/ACT inhaler Inhale 2 puffs into the lungs 2 (two) times daily. 1 each 5   folic acid  (FOLVITE ) 1 MG tablet Take 1 tablet (1 mg total) by mouth  daily.     furosemide  (LASIX ) 20 MG tablet TAKE 1 TABLET BY MOUTH AS NEEDED FOR EDEMA 90 tablet 3   gabapentin  (NEURONTIN ) 300 MG capsule Take 300-900 mg by mouth See admin instructions. Take 600 mg in the morning 300 mg at lunch time and 900 mg at bedtime     hydrOXYzine  (ATARAX ) 10 MG tablet Take 0.5-2 tablets (5-20 mg total) by mouth 3 (three) times daily as needed for anxiety (not with Loratadine ). 90 tablet 2   loratadine  (CLARITIN ) 10 MG tablet Take 10 mg by mouth daily as needed for rhinitis.     nitroGLYCERIN  (NITROSTAT ) 0.4 MG SL tablet Place 1 tablet (0.4 mg total) under the tongue every 5 (five) minutes x 3 doses as needed for chest pain. 25 tablet 2   nystatin  cream (MYCOSTATIN ) Apply 1 application topically 2 (two) times daily. (Patient taking differently: Apply 1 application  topically daily as needed (vaginal yeast).) 30 g 2   pantoprazole  (PROTONIX ) 40 MG tablet Take 40 mg by mouth 2 (two) times daily.     pentosan polysulfate (ELMIRON ) 100 MG capsule Take 1 capsule (100 mg total) by mouth 3 (three) times daily. Reported on 01/05/2016 (Patient taking differently: Take 100 mg by mouth  daily as needed (burning and pain). Reported on 01/05/2016) 90 capsule 11   polyethylene glycol (MIRALAX ) 17 g packet Take 17 g by mouth 2 (two) times daily. 14 each 0   sucralfate  (CARAFATE ) 1 GM/10ML suspension Take 10 mLs (1 g total) by mouth 4 (four) times daily -  with meals and at bedtime. 420 mL 1   traMADol  (ULTRAM ) 50 MG tablet Take 1 tablet by mouth 3 (three) times daily as needed.     traZODone  (DESYREL ) 100 MG tablet Take 1 tablet (100 mg total) by mouth at bedtime. 90 tablet 0   Vitamin D , Ergocalciferol , (DRISDOL ) 1.25 MG (50000 UNIT) CAPS capsule Take 1 capsule (50,000 Units total) by mouth every 7 (seven) days. 12x Weeks 5 capsule 3   No current facility-administered medications for this visit.   Allergies  Allergen Reactions   Clindamycin Hcl Shortness Of Breath and Rash   Penicillins Anaphylaxis   Rosuvastatin  Anaphylaxis   Baclofen  Rash   Codeine Hives and Nausea Only   Erythromycin Hives   Latex Hives   Lincomycin Other (See Comments)   Pentazocine Lactate Other (See Comments)    HALLUCINATION   Pneumococcal Vaccine Polyvalent Hives, Swelling and Other (See Comments)    REACTION: swelling at injection site   Prednisone  Rash    Other reaction(s): Rash, Hives - can tolerate if she takes with benadryl     Tamoxifen  Nausea And Vomiting   Haemophilus Influenzae Other (See Comments)    Local reaction at the site    Haemophilus Influenzae Vaccines Other (See Comments)    Local reaction at site   Levofloxacin      Other reaction(s): sick   Other Other (See Comments)    Father had malignant hyperthermia   Doxycycline  Rash   Fluzone [Influenza Virus Vaccine] Hives    Local reaction at the site     Review of Systems: All systems reviewed and negative except where noted in HPI.   Lab Results  Component Value Date   WBC 11.0 (H) 03/04/2024   HGB 14.3 03/04/2024   HCT 45.5 03/04/2024   MCV 96.6 03/04/2024   PLT 264 03/04/2024    Lab Results  Component Value  Date   NA 140 03/04/2024   CL 100  03/04/2024   K 4.6 03/04/2024   CO2 32 03/04/2024   BUN 17 03/04/2024   CREATININE 1.23 (H) 03/04/2024   GFRNONAA 45 (L) 03/04/2024   CALCIUM  9.6 03/04/2024   ALBUMIN 4.2 03/04/2024   GLUCOSE 104 (H) 03/04/2024    Lab Results  Component Value Date   ALT 11 03/04/2024   AST 18 03/04/2024   GGT 11 03/30/2022   ALKPHOS 107 03/04/2024   BILITOT 0.7 03/04/2024    Lab Results  Component Value Date   IRON 98 03/04/2024   TIBC 225 (L) 03/04/2024   FERRITIN 195 03/04/2024     Physical Exam: BP 124/68   Ht 5' 4.5" (1.638 m)   Wt 125 lb (56.7 kg) Comment: per patient, pt has boot  SpO2 92%   BMI 21.12 kg/m  Constitutional: Pleasant,well-developed, female in no acute distress. Neurological: Alert and oriented to person place and time. Psychiatric: Normal mood and affect. Behavior is normal.   ASSESSMENT: 78 y.o. female here for assessment of the following  1. Chronic constipation   2. Bloating   3. Abnormal PET scan of colon   4. Iron deficiency anemia, unspecified iron deficiency anemia type   5. Gastroesophageal reflux disease, unspecified whether esophagitis present   6. Dysphagia, unspecified type    Multiple issues discussed with the patient and her husband today.  Since I have last seen her she developed an iron deficiency in August of last year, responded well to low-dose oral iron and has resolved.  She never had any overt bleeding.  She has otherwise had a PET scan showing some questionable uptake at the IC valve, could very well be physiologic versus other pathology.  Symptomatically she is feeling worse with ongoing bloating, discomfort and issues with constipation.  She does not like the taste of either fiber of supplementation or MiraLAX .  Despite higher dose MiraLAX  her constipation remains an issue.  I think that her symptoms are likely been driven by constipation, failing conservative measures.  We discussed some other  options.  Offered her a trial of Linzess  again, she thinks she has been on this remotely and wanted to try it.  I gave her some samples of the lower dose and higher dose to use over the next few weeks.  If this works well for her she should continue it.  If it does not work then I asked her to contact me and we will try something else.  She is otherwise feeling higher dose Protonix .  Continue reflux and some dysphagia, likely from UES stenosis.  Offered her sample of Voquenza today, was able to give her 2-week supply of 10 mg dosing.  If this works for her we can try to get a prescription for it if her insurance will cover it.  Moving forward we discussed how aggressive she want to be with workup of some of these issues especially in light of her iron deficiency and PET scan findings.  I offered her an EGD and colonoscopy to evaluate these issues, she will also perform dilation of her esophagus for dysphagia.  Following discussion of risks and benefits at length, she understands and wants to proceed.  I recommended double preparation for her colonoscopy given her prep was somewhat limited on her last exam.  She is agreeable with this.  In the interim I recommend she stop oral iron, I think making her constipation worse and we will keep an eye on her blood counts moving forward.  If she has recurrent  anemia/iron deficiency may need IV iron.  Further, if no clear cause for her iron deficiency on EGD and colonoscopy and it recurs off oral iron, may need to consider capsule endoscopy as well.  Following extensive discussion with the patient and her husband she wishes to proceed and agrees with changes in her regimen  PLAN: - schedule EGD and colonoscopy at the [facility] - double prep - trial of Linzess  and / trial, use lowest dose needed, in place of Miralax .  She will let me know her response to this and if we need to continue with the prescription or change course. - trial of Voquezna  samples 10mg  /  day for 2 weeks, if this works we will get her prescription and hopefully can be covered by insurance - stop oral iron - will monitor CBC and Hgb off oral iron, give IV iron if needed  Christi Coward, MD [facility]

## 2024-04-18 ENCOUNTER — Encounter: Payer: Self-pay | Admitting: Rheumatology

## 2024-04-20 NOTE — Assessment & Plan Note (Signed)
 hgba1c acceptable, minimize simple carbs. Increase exercise as tolerated.

## 2024-04-20 NOTE — Assessment & Plan Note (Signed)
 No recent excerbation no changes to meds

## 2024-04-20 NOTE — Assessment & Plan Note (Signed)
Statin intolerant. Encourage heart healthy diet such as MIND or DASH diet, increase exercise, avoid trans fats, simple carbohydrates and processed foods, consider a krill or fish or flaxseed oil cap daily.

## 2024-04-20 NOTE — Assessment & Plan Note (Signed)
 Continue to monitor

## 2024-04-20 NOTE — Assessment & Plan Note (Signed)
 Supplement and monitor

## 2024-04-20 NOTE — Assessment & Plan Note (Signed)
 Does not tolerate statins.

## 2024-04-20 NOTE — Assessment & Plan Note (Signed)
 Monitor and report, no changes to meds. Encouraged heart healthy diet such as the DASH diet and exercise as tolerated.

## 2024-04-20 NOTE — Assessment & Plan Note (Signed)
 Hydrate and monitor

## 2024-04-20 NOTE — Assessment & Plan Note (Deleted)
 No recent exacerbation

## 2024-04-21 ENCOUNTER — Encounter (INDEPENDENT_AMBULATORY_CARE_PROVIDER_SITE_OTHER): Payer: Self-pay | Admitting: Otolaryngology

## 2024-04-21 ENCOUNTER — Ambulatory Visit (INDEPENDENT_AMBULATORY_CARE_PROVIDER_SITE_OTHER): Payer: Medicare Other | Admitting: Family Medicine

## 2024-04-21 ENCOUNTER — Encounter: Payer: Self-pay | Admitting: Family Medicine

## 2024-04-21 VITALS — BP 122/74 | HR 76 | Resp 16 | Ht 64.5 in | Wt 128.0 lb

## 2024-04-21 DIAGNOSIS — R739 Hyperglycemia, unspecified: Secondary | ICD-10-CM

## 2024-04-21 DIAGNOSIS — E538 Deficiency of other specified B group vitamins: Secondary | ICD-10-CM

## 2024-04-21 DIAGNOSIS — J45909 Unspecified asthma, uncomplicated: Secondary | ICD-10-CM

## 2024-04-21 DIAGNOSIS — Z789 Other specified health status: Secondary | ICD-10-CM

## 2024-04-21 DIAGNOSIS — R350 Frequency of micturition: Secondary | ICD-10-CM | POA: Diagnosis not present

## 2024-04-21 DIAGNOSIS — J40 Bronchitis, not specified as acute or chronic: Secondary | ICD-10-CM

## 2024-04-21 DIAGNOSIS — N183 Chronic kidney disease, stage 3 unspecified: Secondary | ICD-10-CM

## 2024-04-21 DIAGNOSIS — J4489 Other specified chronic obstructive pulmonary disease: Secondary | ICD-10-CM | POA: Diagnosis not present

## 2024-04-21 DIAGNOSIS — I10 Essential (primary) hypertension: Secondary | ICD-10-CM

## 2024-04-21 DIAGNOSIS — E785 Hyperlipidemia, unspecified: Secondary | ICD-10-CM

## 2024-04-21 DIAGNOSIS — R7989 Other specified abnormal findings of blood chemistry: Secondary | ICD-10-CM | POA: Diagnosis not present

## 2024-04-21 LAB — URINALYSIS, ROUTINE W REFLEX MICROSCOPIC: RBC / HPF: NONE SEEN (ref 0–?)

## 2024-04-21 MED ORDER — METHYLPREDNISOLONE 4 MG PO TABS
ORAL_TABLET | ORAL | 0 refills | Status: DC
Start: 2024-04-21 — End: 2024-06-12

## 2024-04-21 MED ORDER — FLUTICASONE PROPIONATE HFA 220 MCG/ACT IN AERO: 1.0000 | INHALATION_SPRAY | Freq: Two times a day (BID) | RESPIRATORY_TRACT | 12 refills | Status: DC

## 2024-04-21 MED ORDER — CEFDINIR 300 MG PO CAPS: 300.0000 mg | ORAL_CAPSULE | Freq: Two times a day (BID) | ORAL | 0 refills | Status: AC

## 2024-04-21 NOTE — Progress Notes (Signed)
 Subjective:    Patient ID: [name], female    DOB: 11-03-1946, 78 y.o.   MRN: [id number]  Chief Complaint  Patient presents with   Medical Management of Chronic Issues    Patient presents today for a follow-up   Quality Metric Gaps    HPI Discussed the use of AI scribe software for clinical note transcription with the patient, who gave verbal consent to proceed.  History of Present Illness [name] is a 78 year old female who presents with medication refill issues and urinary symptoms.  She has not taken her duloxetine  for about eight or nine days due to insurance issues. She has been on duloxetine  twice a day since at least 2012, with a dosage adjustment during a period of urinary tract infections. Otherwise, her dosage has remained consistent.  She has been experiencing symptoms of a urinary tract infection for the past ten days, including urinary frequency, urgency, and burning, described as 'feels like straws coming up in there.' Despite drinking plenty of fluids, the symptoms persist. She has been using an over-the-counter urinary analgesic, which provided temporary relief.  She has a history of gastrointestinal issues, including constipation and diarrhea. Recently, her Protonix  was discontinued, and she was started on Linzess . Her gastroesophageal reflux disease symptoms have not improved with the new medication regimen. A previous PET scan in January indicated a possible issue in the colon, which was not communicated to her at the time.  She reports shortness of breath and low oxygen saturation levels, with readings as low as 78% at home. She uses an inhaler, which provides some relief. She experiences difficulty breathing when bending down and has a productive cough in the mornings with brown mucus. She is currently using albuterol  four times a day and has been prescribed fluticasone , which she uses twice a day.  She is preparing to attend her granddaughter's high  school graduation in [place] on May 19th and is concerned about her ability to climb stairs due to her respiratory issues.    Past Medical History:  Diagnosis Date   Abdominal pain 10/26/2013   Allergic rhinitis 01/25/2016   ANA positive 07/29/2013   Anemia    Arthralgia 08/18/2013   Arthritis    Asthma, allergic 08/05/2012   Atrial flutter    past history- not current   Atypical chest pain 01/01/2015   Bilateral carpal tunnel syndrome 03/04/2015   CAD in native artery 08/25/2010   Minor CAD 2009, 2011, low risk Myoview  2013 and 2016        Cataract    bilaterally removed    Cellulitis of breast 02/05/2023   Chronic constipation    Chronic cough 01/09/2017   Chronic interstitial cystitis 02/01/2017   Chronic night sweats 01/08/2015   Common bile duct dilatation 02/13/2023   Concussion    x 3   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   DDD (degenerative disc disease), cervical 09/17/2018   Degenerative scoliosis 04/22/2018   Deviated septum 05/12/2016   Dysphagia    Dyspnea    Dysuria 03/09/2021   Elevated alkaline phosphatase level 01/25/2023   Essential hypertension 08/25/2010   External hemorrhoids 02/05/2015   Facial trauma 01/22/2023   Family history of adverse reaction to anesthesia    Family history of malignant hyperthermia    father had this   Fibrocystic breast disease 10/18/2010   Fibromyalgia    Frequency of urination    Generalized anxiety disorder 08/03/2010   GERD (gastroesophageal reflux disease)  Headache 10/13/2013   High serum vitamin D  05/18/2021   History of chronic bronchitis    History of colonic polyp 02/25/2019   History of hiatal hernia    History of tachycardia    CONTROLLED  WITH ATENOLOL    Hyperglycemia 03/08/2021   Hyperkalemia 09/28/2022   Hyperlipidemia    Intertrigo 12/13/2022   Kidney stone 02/26/2019   Knee pain, right 04/30/2015   Left hip pain 04/30/2015   Leg pain 02/24/2011   Qualifier: Diagnosis of   By: Jalene Mayor DOAdel Holt         Leukocytosis 07/05/2022   Local reaction to hymenoptera sting 05/09/2023   Low back pain 09/08/2020   Major depressive disorder 02/05/2013   Malignant neoplasm of lower-outer quadrant of right breast of female, estrogen receptor positive 01/03/2013   Nasal turbinate hypertrophy 05/12/2016   Oral herpes 02/05/2023   OSA and COPD overlap syndrome 06/10/2020   Osteoporosis 01/2019   T score -2.2 stable/improved from prior study   Pelvic pain    Peripheral neuropathy 05/22/2014   Personal history of radiation therapy    Pneumonia    Primary insomnia 06/25/2017   Pulmonary nodules 02/12/2015   Rash and other nonspecific skin eruption 05/09/2023   Recurrent falls 05/02/2020   Recurrent sinusitis 02/25/2019   Right arm pain 07/20/2016   RLS (restless legs syndrome) 11/09/2019   RUQ pain 07/05/2022   S/P radiation therapy 11/12/12 - 12/05/12   right Breast   Sciatica 04/30/2015   Sepsis 2014   from UTI    Sjogren's disease 12/12/2021   Splenic lesion 09/02/2012   Sprain of lateral ligament of ankle joint 07/31/2023   Stage 3 chronic kidney disease    Statin intolerance 02/29/2020   Stricture and stenosis of esophagus 10/19/2011   Tongue lesion 02/05/2023   Tremor 07/05/2022   Urgency of urination    Vertigo 01/22/2023   Vitamin B12 deficiency 03/08/2021   Vocal cord cyst 05/18/2021    Past Surgical History:  Procedure Laterality Date   24 HOUR PH STUDY N/A 04/03/2016   Procedure: 24 HOUR PH STUDY;  Surgeon: Danette Duos, MD;  Location: [facility];  Service: Gastroenterology;  Laterality: N/A;   ANKLE ARTHROSCOPY WITH RECONSTRUCTION Left 03/12/2024   Procedure: ANKLE ARTHROSCOPY WITH OPEN LATERAL ANKLE LIGAMENT STABILIZATION;  Surgeon: Ali Ink, MD;  Location: [facility];  Service: Orthopedics;  Laterality: Left;   BREAST BIOPSY Right 08/23/2012   ADH   BREAST BIOPSY Right 10/11/2012   Ductal Carcinoma   BREAST EXCISIONAL  BIOPSY Right 09/04/2013   benign   BREAST LUMPECTOMY Right 10/11/2012   W/ SLN BX   CARDIAC CATHETERIZATION  09-13-2007  DR [name] Null   WELL-PRESERVED LVF/  DIFFUSE SCATTERED CORONARY CALCIFACATION AND ATHEROSCLEROSIS WITHOUT OBSTRUCTION   CARDIAC CATHETERIZATION  08-04-2010  DR MCALHANY   NON-OBSTRUCTIVE CAD/  pLAD 40%/  oLAD 30%/  mLAD 30%/  pRCA 30%/  EF 60%   CARDIOVASCULAR STRESS TEST  06-18-2012  DR McALHANY   LOW RISK NUCLEAR STUDY/  SMALL FIXED AREA OF MODERATELY DECREASED UPTAKE IN ANTEROSEPTAL WALL WHICH MAY BE ARTIFACTUAL/  NO ISCHEMIA/  EF 68%   COLONOSCOPY  09/29/2010   CYSTOSCOPY     CYSTOSCOPY WITH HYDRODISTENSION AND BIOPSY N/A 03/06/2014   Procedure: CYSTOSCOPY/HYDRODISTENSION/ INSTILATION OF MARCAINE  AND PYRIDIUM ;  Surgeon: Edmund Gouge, MD;  Location: [facility] [place];  Service: Urology;  Laterality: N/A;   CYSTOSCOPY/URETEROSCOPY/HOLMIUM LASER/STENT PLACEMENT Left 03/21/2023   Procedure: CYSTOSCOPY LEFT RETROGRADE  PYELOGRAM, LEFT URETEROSCOPY, HOLMIUM LASER LITHOTRIPSYM, AND LEFT URETERAL STENT PLACEMENT;  Surgeon: Adelbert Homans, MD;  Location: [facility];  Service: Urology;  Laterality: Left;  60 MINUTES   ESOPHAGEAL MANOMETRY N/A 04/03/2016   Procedure: ESOPHAGEAL MANOMETRY (EM);  Surgeon: Danette Duos, MD;  Location: [facility];  Service: Gastroenterology;  Laterality: N/A;   EXTRACORPOREAL SHOCK WAVE LITHOTRIPSY Left 02/06/2019   Procedure: EXTRACORPOREAL SHOCK WAVE LITHOTRIPSY (ESWL);  Surgeon: Ottelin, Mark, MD;  Location: [facility];  Service: Urology;  Laterality: Left;   NASAL SINUS SURGERY  1985   ORIF RIGHT ANKLE  FX  2006   POLYPECTOMY     REMOVAL VOCAL CORD CYST  01/2013   REPAIR OF PERONEUS BREVIS TENDON Left 03/12/2024   Procedure: REPAIR OF PERONEUS TENDON;  Surgeon: Ali Ink, MD;  Location: [facility];  Service: Orthopedics;  Laterality: Left;   RIGHT BREAST BX  08/23/2012   RIGHT HAND SURGERY  X3   LAST ONE 2009   INCLUDES  ORIF RIGHT 5TH FINGER AND REVISION TWICE   SKIN CANCER EXCISION     face,arms   TONSILLECTOMY AND ADENOIDECTOMY  AGE 55   TOTAL ABDOMINAL HYSTERECTOMY W/ BILATERAL SALPINGOOPHORECTOMY  1982   W/  APPENDECTOMY   TRANSTHORACIC ECHOCARDIOGRAM  06/24/2012   GRADE I DIASTOLIC DYSFUNCTION/  EF 55-60%/  MILD MR   UPPER GASTROINTESTINAL ENDOSCOPY      Family History  Problem Relation Age of Onset   Rectal cancer Mother    Colon cancer Mother    Pancreatic cancer Mother    Diabetes Mother    Breast cancer Mother 3   Breast cancer Maternal Aunt        breast   Birth defects Maternal Aunt    Irritable bowel syndrome Son    Heart disease Son        CAD, MV replacement   Allergic Disorder Daughter    Diabetes Daughter    Colon cancer Father    Colon polyps Father    Diabetes Father    Stroke Father    Heart disease Father        CHF   Hyperlipidemia Father    Hypertension Father    Arthritis Father    Breast cancer Paternal Aunt    Breast cancer Paternal Aunt    Arthritis Paternal Uncle    Allergic Disorder Daughter    Heart disease Cousin        CAD, had a blood clot after stenting   Esophageal cancer Neg Hx    Stomach cancer Neg Hx     Social History   Socioeconomic History   Marital status: Married    Spouse name: Currie Douse    Number of children: 3   Years of education: 16   Highest education level: Bachelor's degree (e.g., BA, AB, BS)  Occupational History   Occupation: Retired    Comment: Charity fundraiser  Tobacco Use   Smoking status: Former    Current packs/day: 0.00    Average packs/day: 1 pack/day for 15.0 years (15.0 ttl pk-yrs)    Types: Cigarettes    Start date: 05/27/1990    Quit date: 05/27/2005    Years since quitting: 18.9    Passive exposure: Never   Smokeless tobacco: Never  Vaping Use   Vaping status: Never Used  Substance and Sexual Activity   Alcohol use: No    Alcohol/week: 0.0 standard drinks of alcohol   Drug use: No   Sexual  activity: Not Currently  Partners: Male    Birth control/protection: Surgical    Comment: 1st intercourse 78 yo-Fewer than 5 partners, hysterectomy  Other Topics Concern   Not on file  Social History Narrative   Lives with husband.   Caffeine use: 1/2 cup per day   Exercise-- 2days a week [employer],  Water  aerobics, walking       Retired Engineer, civil (consulting)   No dietary restrictions, tries to maintain a heart healthy diet   Very pleasant lady   Social Drivers of Corporate investment banker Strain: Low Risk  (03/04/2024)   Overall Financial Resource Strain (CARDIA)    Difficulty of Paying Living Expenses: Not hard at all  Food Insecurity: No Food Insecurity (03/04/2024)   Hunger Vital Sign    Worried About Running Out of Food in the Last Year: Never true    Ran Out of Food in the Last Year: Never true  Transportation Needs: No Transportation Needs (03/04/2024)   PRAPARE - Administrator, Civil Service (Medical): No    Lack of Transportation (Non-Medical): No  Physical Activity: Insufficiently Active (03/04/2024)   Exercise Vital Sign    Days of Exercise per Week: 3 days    Minutes of Exercise per Session: 20 min  Stress: No Stress Concern Present (03/04/2024)   [employer] of Occupational Health - Occupational Stress Questionnaire    Feeling of Stress : Not at all  Social Connections: Socially Integrated (03/04/2024)   Social Connection and Isolation Panel [NHANES]    Frequency of Communication with Friends and Family: More than three times a week    Frequency of Social Gatherings with Friends and Family: More than three times a week    Attends Religious Services: More than 4 times per year    Active Member of [employer] or Organizations: Yes    Attends Engineer, structural: More than 4 times per year    Marital Status: Married  Catering manager Violence: Not At Risk (03/04/2024)   Humiliation, Afraid, Rape, and Kick questionnaire    Fear of Current or Ex-Partner: No     Emotionally Abused: No    Physically Abused: No    Sexually Abused: No    Outpatient Medications Prior to Visit  Medication Sig Dispense Refill   acetaminophen  (TYLENOL ) 500 MG tablet Take 1,000 mg by mouth every 6 (six) hours as needed for mild pain or headache.      albuterol  (VENTOLIN  HFA) 108 (90 Base) MCG/ACT inhaler Inhale 2 puffs into the lungs every 4 (four) hours as needed for wheezing or shortness of breath (coughing fits). 18 g 1   amLODipine  (NORVASC ) 5 MG tablet Take 5 mg by mouth daily.     aspirin  EC 81 MG tablet Take 1 tablet (81 mg total) by mouth daily. 90 tablet 3   atenolol  (TENORMIN ) 25 MG tablet Take 1 tablet (25 mg total) by mouth 2 (two) times daily. Take 1 tablet by mouth 2 time a day 180 tablet 3   carboxymethylcellul-glycerin (OPTIVE) 0.5-0.9 % ophthalmic solution Place 1 drop into both eyes daily as needed for dry eyes.     Cholecalciferol  (VITAMIN D3) 50 MCG (2000 UT) CAPS Take 2,000 Units by mouth daily.     cyanocobalamin  (VITAMIN B12) 1000 MCG tablet Take 1,000 mg by mouth daily with breakfast.     diclofenac Sodium (VOLTAREN) 1 % GEL Apply 2 g topically daily as needed (Back pain).     DULoxetine  (CYMBALTA ) 60 MG capsule Take 1 capsule (  60 mg total) by mouth 2 (two) times daily. 180 capsule 1   EPINEPHrine  0.3 mg/0.3 mL IJ SOAJ injection Inject 0.3 mg into the muscle as needed for anaphylaxis. 2 each 1   Evolocumab  (REPATHA  SURECLICK) 140 MG/ML SOAJ Inject 140 mg into the skin every 14 (fourteen) days. 6 mL 0   famotidine  (PEPCID ) 20 MG tablet TAKE 1 TABLET BY MOUTH TWICE DAILY AS NEEDED FOR  HEARTBURN  OR  INDIGESTION 60 tablet 0   FLUoxetine  (PROZAC ) 20 MG capsule Take 1 capsule (20 mg total) by mouth daily. 30 capsule 2   fluticasone  (FLONASE ) 50 MCG/ACT nasal spray Use 2 spray(s) in each nostril once daily (Patient taking differently: Place 2 sprays into both nostrils daily as needed for allergies or rhinitis.) 16 g 5   folic acid  (FOLVITE ) 1 MG tablet Take  1 tablet (1 mg total) by mouth daily.     furosemide  (LASIX ) 20 MG tablet TAKE 1 TABLET BY MOUTH AS NEEDED FOR EDEMA 90 tablet 3   gabapentin  (NEURONTIN ) 300 MG capsule Take 300-900 mg by mouth See admin instructions. Take 600 mg in the morning 300 mg at lunch time and 900 mg at bedtime     hydrOXYzine  (ATARAX ) 10 MG tablet Take 0.5-2 tablets (5-20 mg total) by mouth 3 (three) times daily as needed for anxiety (not with Loratadine ). 90 tablet 2   linaclotide  (LINZESS ) 72 MCG capsule Take 1 capsule (72 mcg total) by mouth daily before breakfast. 12 capsule    loratadine  (CLARITIN ) 10 MG tablet Take 10 mg by mouth daily as needed for rhinitis.     nitroGLYCERIN  (NITROSTAT ) 0.4 MG SL tablet Place 1 tablet (0.4 mg total) under the tongue every 5 (five) minutes x 3 doses as needed for chest pain. 25 tablet 2   nystatin  cream (MYCOSTATIN ) Apply 1 application topically 2 (two) times daily. (Patient taking differently: Apply 1 application  topically daily as needed (vaginal yeast).) 30 g 2   pentosan polysulfate (ELMIRON ) 100 MG capsule Take 1 capsule (100 mg total) by mouth 3 (three) times daily. Reported on 01/05/2016 (Patient taking differently: Take 100 mg by mouth daily as needed (burning and pain). Reported on 01/05/2016) 90 capsule 11   polyethylene glycol (MIRALAX ) 17 g packet Take 17 g by mouth 2 (two) times daily. 14 each 0   sucralfate  (CARAFATE ) 1 GM/10ML suspension Take 10 mLs (1 g total) by mouth 4 (four) times daily -  with meals and at bedtime. 420 mL 1   traMADol  (ULTRAM ) 50 MG tablet Take 1 tablet by mouth 3 (three) times daily as needed.     traZODone  (DESYREL ) 100 MG tablet Take 1 tablet (100 mg total) by mouth at bedtime. 90 tablet 0   Vitamin D , Ergocalciferol , (DRISDOL ) 1.25 MG (50000 UNIT) CAPS capsule Take 1 capsule (50,000 Units total) by mouth every 7 (seven) days. 12x Weeks 5 capsule 3   Vonoprazan Fumarate  (VOQUEZNA ) 10 MG TABS Take 1 capsule by mouth daily.     fluticasone  (FLOVENT   HFA) 110 MCG/ACT inhaler Inhale 2 puffs into the lungs 2 (two) times daily. 1 each 5   pantoprazole  (PROTONIX ) 40 MG tablet Take 40 mg by mouth 2 (two) times daily. (Patient not taking: Reported on 04/21/2024)     No facility-administered medications prior to visit.    Allergies  Allergen Reactions   Clindamycin Hcl Shortness Of Breath and Rash   Penicillins Anaphylaxis   Rosuvastatin  Anaphylaxis   Baclofen  Rash   Codeine Hives  and Nausea Only   Erythromycin Hives   Latex Hives   Lincomycin Other (See Comments)   Pentazocine Lactate Other (See Comments)    HALLUCINATION   Pneumococcal Vaccine Polyvalent Hives, Swelling and Other (See Comments)    REACTION: swelling at injection site   Prednisone  Rash    Other reaction(s): Rash, Hives - can tolerate if she takes with benadryl     Tamoxifen  Nausea And Vomiting   Haemophilus Influenzae Other (See Comments)    Local reaction at the site    Haemophilus Influenzae Vaccines Other (See Comments)    Local reaction at site   Levofloxacin      Other reaction(s): sick   Other Other (See Comments)    Father had malignant hyperthermia   Doxycycline  Rash   Fluzone [Influenza Virus Vaccine] Hives    Local reaction at the site    Review of Systems  Constitutional:  Positive for malaise/fatigue. Negative for fever.  HENT:  Positive for congestion and sinus pain. Negative for ear pain.   Eyes:  Negative for blurred vision.  Respiratory:  Positive for cough, sputum production, shortness of breath and wheezing. Negative for hemoptysis.   Cardiovascular:  Negative for chest pain, palpitations and leg swelling.  Gastrointestinal:  Negative for abdominal pain, blood in stool and nausea.  Genitourinary:  Positive for dysuria, frequency and urgency. Negative for flank pain and hematuria.  Musculoskeletal:  Positive for myalgias. Negative for falls.  Skin:  Negative for rash.  Neurological:  Negative for dizziness, loss of consciousness and  headaches.  Endo/Heme/Allergies:  Negative for environmental allergies.  Psychiatric/Behavioral:  Negative for depression. The patient is nervous/anxious.        Objective:    Physical Exam Constitutional:      General: She is not in acute distress.    Appearance: Normal appearance. She is well-developed. She is not toxic-appearing.  HENT:     Head: Normocephalic and atraumatic.     Right Ear: External ear normal.     Left Ear: External ear normal.     Nose: Nose normal.  Eyes:     General:        Right eye: No discharge.        Left eye: No discharge.     Conjunctiva/sclera: Conjunctivae normal.  Neck:     Thyroid : No thyromegaly.  Cardiovascular:     Rate and Rhythm: Normal rate and regular rhythm.     Heart sounds: Normal heart sounds. No murmur heard. Pulmonary:     Effort: Pulmonary effort is normal. No respiratory distress.     Breath sounds: Normal breath sounds.  Abdominal:     General: Bowel sounds are normal.     Palpations: Abdomen is soft.     Tenderness: There is no abdominal tenderness. There is no guarding.  Musculoskeletal:        General: Normal range of motion.     Cervical back: Neck supple.  Lymphadenopathy:     Cervical: No cervical adenopathy.  Skin:    General: Skin is warm and dry.  Neurological:     Mental Status: She is alert and oriented to person, place, and time.  Psychiatric:        Mood and Affect: Mood normal.        Behavior: Behavior normal.        Thought Content: Thought content normal.        Judgment: Judgment normal.     BP 122/74   Pulse 76  Resp 16   Ht 5' 4.5" (1.638 m)   Wt 128 lb (58.1 kg)   SpO2 96%   BMI 21.63 kg/m  Wt Readings from Last 3 Encounters:  04/21/24 128 lb (58.1 kg)  04/17/24 125 lb (56.7 kg)  04/14/24 129 lb (58.5 kg)    Diabetic Foot Exam - Simple   No data filed    Lab Results  Component Value Date   WBC 11.0 (H) 03/04/2024   HGB 14.3 03/04/2024   HCT 45.5 03/04/2024   PLT 264  03/04/2024   GLUCOSE 104 (H) 03/04/2024   CHOL 131 08/28/2023   TRIG 122.0 08/28/2023   HDL 39.40 08/28/2023   LDLDIRECT 74.0 06/12/2023   LDLCALC 67 08/28/2023   ALT 11 03/04/2024   AST 18 03/04/2024   NA 140 03/04/2024   K 4.6 03/04/2024   CL 100 03/04/2024   CREATININE 1.23 (H) 03/04/2024   BUN 17 03/04/2024   CO2 32 03/04/2024   TSH 2.595 03/04/2024   INR 1.05 08/03/2010   HGBA1C 5.5 05/28/2023   MICROALBUR <0.7 08/08/2023    Lab Results  Component Value Date   TSH 2.595 03/04/2024   Lab Results  Component Value Date   WBC 11.0 (H) 03/04/2024   HGB 14.3 03/04/2024   HCT 45.5 03/04/2024   MCV 96.6 03/04/2024   PLT 264 03/04/2024   Lab Results  Component Value Date   NA 140 03/04/2024   K 4.6 03/04/2024   CHLORIDE 102 04/20/2015   CO2 32 03/04/2024   GLUCOSE 104 (H) 03/04/2024   BUN 17 03/04/2024   CREATININE 1.23 (H) 03/04/2024   BILITOT 0.7 03/04/2024   ALKPHOS 107 03/04/2024   AST 18 03/04/2024   ALT 11 03/04/2024   PROT 6.8 03/04/2024   ALBUMIN 4.2 03/04/2024   CALCIUM  9.6 03/04/2024   ANIONGAP 8 03/04/2024   EGFR 50.0 08/15/2023   GFR 60.42 01/11/2024   Lab Results  Component Value Date   CHOL 131 08/28/2023   Lab Results  Component Value Date   HDL 39.40 08/28/2023   Lab Results  Component Value Date   LDLCALC 67 08/28/2023   Lab Results  Component Value Date   TRIG 122.0 08/28/2023   Lab Results  Component Value Date   CHOLHDL 3 08/28/2023   Lab Results  Component Value Date   HGBA1C 5.5 05/28/2023       Assessment & Plan:  Extrinsic asthma without complication, unspecified asthma severity, unspecified whether persistent  COPD with asthma (HCC) Assessment & Plan: No recent excerbation no changes to meds   Essential hypertension Assessment & Plan: Monitor and report, no changes to meds. Encouraged heart healthy diet such as the DASH diet and exercise as tolerated.    Orders: -     CBC with Differential/Platelet -      Comprehensive metabolic panel with GFR -     TSH  High serum vitamin D  Assessment & Plan: Continue to monitor  Orders: -     VITAMIN D  25 Hydroxy (Vit-D Deficiency, Fractures)  Hyperglycemia Assessment & Plan: hgba1c acceptable, minimize simple carbs. Increase exercise as tolerated.   Orders: -     Hemoglobin A1c  Hyperlipidemia, unspecified hyperlipidemia type Assessment & Plan: Statin intolerant Encourage heart healthy diet such as MIND or DASH diet, increase exercise, avoid trans fats, simple carbohydrates and processed foods, consider a krill or fish or flaxseed oil cap daily.    Orders: -     Lipid panel  Stage  3 chronic kidney disease, unspecified whether stage 3a or 3b CKD (HCC) Assessment & Plan: Hydrate and monitor    Statin intolerance Assessment & Plan: Does not tolerate statins   Vitamin B12 deficiency Assessment & Plan: Supplement and monitor   Orders: -     Vitamin B12  Urinary frequency -     Urinalysis, Routine w reflex microscopic -     Urine Culture  Bronchitis Assessment & Plan: Last week she notes her O2 sats had dropped to 85% on RA   Other orders -     Fluticasone  Propionate HFA; Inhale 1-2 puffs into the lungs in the morning and at bedtime.  Dispense: 1 each; Refill: 12 -     methylPREDNISolone ; 6 tabs po x 1 day then 5 tabs po x 1 day then 4 tabs po x 1 day then 3 tabs po x 1 day then 2 tabs po x 1 day then 1 tab po x 1 day and stop  Dispense: 21 tablet; Refill: 0 -     Cefdinir ; Take 1 capsule (300 mg total) by mouth 2 (two) times daily for 10 days.  Dispense: 20 capsule; Refill: 0    Assessment and Plan Assessment & Plan Gastroesophageal reflux disease (GERD) GERD symptoms persist despite Linzess . Carafate  provides relief. Gastroenterologist plans colonoscopy, endoscopy, and esophageal dilatation due to PET scan findings and persistent symptoms. - Continue Linzess  as prescribed by the gastroenterologist. - Consider reintroducing  Carafate  for symptom relief if approved by the gastroenterologist. - Schedule colonoscopy, endoscopy, and esophageal dilatation as recommended by the gastroenterologist.  Constipation and diarrhea Chronic constipation and diarrhea managed with Linzess  prescribed by the gastroenterologist. - Continue Linzess  as prescribed by the gastroenterologist. - Follow up with the gastroenterologist for further evaluation and management.  Shortness of breath Intermittent shortness of breath with low oxygen saturation. Possible bronchiectasis. Symptoms worsen when bending over, with productive cough and brown sputum. - Increase Flovent  dosage to 220 mcg, 2 puffs twice daily. - Prescribe cefdinir  300 mg twice daily to cover respiratory infection. - Consider Medrol  Dosepak if symptoms do not improve. - Refer to pulmonologist for further evaluation and management.  Bronchiectasis Suspected bronchiectasis with symptoms of difficulty breathing, especially when bending over, and productive cough with brown sputum. - Refer to pulmonologist for further evaluation and management. - Increase Flovent  dosage to 220 mcg, 2 puffs twice daily. - Prescribe cefdinir  300 mg twice daily to cover respiratory infection. - Consider Medrol  Dosepak if symptoms do not improve.  Urinary tract infection Symptoms suggest UTI. Differential includes interstitial cystitis. Awaiting urine culture results. - Order urine culture to confirm UTI. - Prescribe cefdinir  300 mg twice daily to cover both urinary and respiratory infections.     Randie Bustle, MD

## 2024-04-21 NOTE — Patient Instructions (Signed)

## 2024-04-21 NOTE — Assessment & Plan Note (Signed)
 Last week she notes her O2 sats had dropped to 85% on RA

## 2024-04-22 ENCOUNTER — Telehealth: Payer: Self-pay | Admitting: Gastroenterology

## 2024-04-22 ENCOUNTER — Encounter (INDEPENDENT_AMBULATORY_CARE_PROVIDER_SITE_OTHER): Payer: Self-pay

## 2024-04-22 ENCOUNTER — Ambulatory Visit (INDEPENDENT_AMBULATORY_CARE_PROVIDER_SITE_OTHER): Admitting: Otolaryngology

## 2024-04-22 VITALS — BP 97/65 | HR 71 | Ht 64.5 in | Wt 124.0 lb

## 2024-04-22 DIAGNOSIS — J342 Deviated nasal septum: Secondary | ICD-10-CM | POA: Diagnosis not present

## 2024-04-22 DIAGNOSIS — R0981 Nasal congestion: Secondary | ICD-10-CM | POA: Diagnosis not present

## 2024-04-22 DIAGNOSIS — J329 Chronic sinusitis, unspecified: Secondary | ICD-10-CM

## 2024-04-22 DIAGNOSIS — H9319 Tinnitus, unspecified ear: Secondary | ICD-10-CM

## 2024-04-22 DIAGNOSIS — J343 Hypertrophy of nasal turbinates: Secondary | ICD-10-CM | POA: Diagnosis not present

## 2024-04-22 DIAGNOSIS — J310 Chronic rhinitis: Secondary | ICD-10-CM

## 2024-04-22 DIAGNOSIS — H9313 Tinnitus, bilateral: Secondary | ICD-10-CM

## 2024-04-22 DIAGNOSIS — H9203 Otalgia, bilateral: Secondary | ICD-10-CM | POA: Diagnosis not present

## 2024-04-22 DIAGNOSIS — R300 Dysuria: Secondary | ICD-10-CM | POA: Diagnosis not present

## 2024-04-22 LAB — CBC WITH DIFFERENTIAL/PLATELET
Basophils Absolute: 0.1 10*3/uL (ref 0.0–0.1)
Basophils Relative: 0.7 % (ref 0.0–3.0)
Eosinophils Absolute: 0.3 10*3/uL (ref 0.0–0.7)
Eosinophils Relative: 2.7 % (ref 0.0–5.0)
HCT: 41 % (ref 36.0–46.0)
Hemoglobin: 13.3 g/dL (ref 12.0–15.0)
Lymphocytes Relative: 17.4 % (ref 12.0–46.0)
Lymphs Abs: 1.7 10*3/uL (ref 0.7–4.0)
MCHC: 32.5 g/dL (ref 30.0–36.0)
MCV: 94.6 fl (ref 78.0–100.0)
Monocytes Absolute: 0.7 10*3/uL (ref 0.1–1.0)
Monocytes Relative: 7.8 % (ref 3.0–12.0)
Neutro Abs: 6.8 10*3/uL (ref 1.4–7.7)
Neutrophils Relative %: 71.4 % (ref 43.0–77.0)
Platelets: 293 10*3/uL (ref 150.0–400.0)
RBC: 4.33 Mil/uL (ref 3.87–5.11)
RDW: 15.7 % — ABNORMAL HIGH (ref 11.5–15.5)
WBC: 9.5 10*3/uL (ref 4.0–10.5)

## 2024-04-22 LAB — COMPREHENSIVE METABOLIC PANEL WITH GFR
ALT: 14 U/L (ref 0–35)
AST: 21 U/L (ref 0–37)
Albumin: 4.1 g/dL (ref 3.5–5.2)
Alkaline Phosphatase: 101 U/L (ref 39–117)
BUN: 17 mg/dL (ref 6–23)
CO2: 30 meq/L (ref 19–32)
Calcium: 9.2 mg/dL (ref 8.4–10.5)
Chloride: 101 meq/L (ref 96–112)
Creatinine, Ser: 0.93 mg/dL (ref 0.40–1.20)
GFR: 58.74 mL/min — ABNORMAL LOW (ref >60.00)
Glucose, Bld: 80 mg/dL (ref 70–99)
Potassium: 4.3 meq/L (ref 3.5–5.1)
Sodium: 139 meq/L (ref 135–145)
Total Bilirubin: 0.7 mg/dL (ref 0.2–1.2)
Total Protein: 6.4 g/dL (ref 6.0–8.3)

## 2024-04-22 LAB — LIPID PANEL
Cholesterol: 161 mg/dL (ref 0–200)
HDL: 46.8 mg/dL (ref >39.00)
LDL Cholesterol: 83 mg/dL (ref 0–99)
NonHDL: 114.11
Total CHOL/HDL Ratio: 3
Triglycerides: 158 mg/dL — ABNORMAL HIGH (ref 0.0–149.0)
VLDL: 31.6 mg/dL (ref 0.0–40.0)

## 2024-04-22 LAB — URINE CULTURE
MICRO NUMBER:: 16382839
Result:: NO GROWTH
SPECIMEN QUALITY:: ADEQUATE

## 2024-04-22 LAB — HEMOGLOBIN A1C: Hgb A1c MFr Bld: 5.3 % (ref 4.6–6.5)

## 2024-04-22 LAB — TSH: TSH: 2.69 u[IU]/mL (ref 0.35–5.50)

## 2024-04-22 LAB — VITAMIN D 25 HYDROXY (VIT D DEFICIENCY, FRACTURES): VITD: 24.93 ng/mL — ABNORMAL LOW (ref 30.00–100.00)

## 2024-04-22 LAB — VITAMIN B12: Vitamin B-12: 255 pg/mL (ref 211–911)

## 2024-04-22 NOTE — Telephone Encounter (Signed)
 Thanks John.   Mylinda Asa, can you please let the patient know okay to proceed at the [facility] as scheduled. thanks

## 2024-04-22 NOTE — Telephone Encounter (Signed)
 Inbound call from patient stating that she is scheduled to have endoscopy and colonoscopy on 6/19 with Dr. General Kenner. Patient stated she wanted to call and remind Dr. General Kenner that her dad had Malignant hyperthermia and wanted to remind Dr. General Kenner because it is really important that she is not around any gas. Please advise.

## 2024-04-22 NOTE — Telephone Encounter (Signed)
 John would you mind taking a look at this patient's chart.  She has had numerous exams with us  in the [facility] over many years.  This is the first time hearing of her father's history of this.  Want to make sure she is safe for the [facility].  Thanks for your opinion.

## 2024-04-23 ENCOUNTER — Ambulatory Visit

## 2024-04-23 ENCOUNTER — Other Ambulatory Visit: Payer: Self-pay | Admitting: Cardiology

## 2024-04-23 DIAGNOSIS — H9203 Otalgia, bilateral: Secondary | ICD-10-CM | POA: Insufficient documentation

## 2024-04-23 DIAGNOSIS — H9313 Tinnitus, bilateral: Secondary | ICD-10-CM | POA: Insufficient documentation

## 2024-04-23 NOTE — Telephone Encounter (Signed)
 Informed patient she has been cleared from a anesthesia standpoint and can have her procedures in the [facility]. Patient verbalized understanding.

## 2024-04-23 NOTE — Progress Notes (Signed)
 Patient ID: [name], female   DOB: October 15, 1946, 78 y.o.   MRN: [id number]  Follow-up: Chronic nasal congestion, recurrent sinusitis New complaints: Bilateral ear pain, tinnitus  HPI: The patient is a 78 year old female who returns today for her follow-up evaluation.  The patient was previously seen for recurrent sinusitis and nasal congestion.  She was noted to have nasal mucosal congestion, nasal septal deviation, and bilateral inferior turbinate hypertrophy.  She was treated with Flonase  nasal spray.  The patient returns today complaining of an episode of recurrent sinusitis recently.  She has also noted bilateral ear pain and tinnitus.  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  She denies any otorrhea or change in her hearing.  Exam: General: Communicates without difficulty, well nourished, no acute distress. Head: Normocephalic, no evidence injury, no tenderness, facial buttresses intact without stepoff. Face/sinus: No tenderness to palpation and percussion. Facial movement is normal and symmetric. Eyes: PERRL, EOMI. No scleral icterus, conjunctivae clear. Neuro: CN II exam reveals vision grossly intact.  No nystagmus at any point of gaze. Ears: Auricles well formed without lesions.  Ear canals are intact without mass or lesion.  No erythema or edema is appreciated.  The TMs are intact without fluid.  Bilateral TMJ crepitus.  Nose: External evaluation reveals normal support and skin without lesions.  Dorsum is intact.  Anterior rhinoscopy reveals congested mucosa over anterior aspect of inferior turbinates and deviated septum.  No purulence noted. Oral:  Oral cavity and oropharynx are intact, symmetric, without erythema or edema.  Mucosa is moist without lesions. Neck: Full range of motion without pain.  There is no significant lymphadenopathy.  No masses palpable.  Thyroid  bed within normal limits to palpation.  Parotid glands and submandibular glands equal bilaterally without  mass.  Trachea is midline. Neuro:  CN 2-12 grossly intact.   Assessment: 1.  Bilateral referred otalgia, likely secondary to TMJ disorder.  The patient is noted to have bilateral TMJ crepitus. 2.  Subjective tinnitus. 3.  Her ear canals, tympanic membranes, and middle ear spaces are all normal.  No middle ear effusion or infection is noted. 4.  Chronic rhinitis with nasal mucosal congestion, nasal septal deviation, and bilateral inferior turbinate hypertrophy.  No acute infection is noted today.  Plan: 1.  The physical exam findings are reviewed with the patient.  She is reassured that no anatomic abnormality or acute infection is noted today. 2.  The strategies to cope with tinnitus, including the use of masker, hearing aids, tinnitus retraining therapy, and avoidance of caffeine and alcohol are discussed. 3.  Oral surgery referral for evaluation of her TMJ disorder. 4.  Continue Flonase  nasal spray to treat her chronic nasal congestion and recurrent sinusitis. 5.  The patient is encouraged to call with any questions or concerns.

## 2024-04-25 ENCOUNTER — Other Ambulatory Visit: Payer: Self-pay | Admitting: Family Medicine

## 2024-04-25 DIAGNOSIS — S93492A Sprain of other ligament of left ankle, initial encounter: Secondary | ICD-10-CM | POA: Diagnosis not present

## 2024-04-25 NOTE — Therapy (Signed)
 OUTPATIENT PHYSICAL THERAPY LOWER EXTREMITY EVALUATION   Patient Name: [name] MRN: [id number] DOB:10-Sep-1946, 78 y.o., female Today's Date: 04/28/2024  END OF SESSION:  PT End of Session - 04/28/24 0835     Visit Number 1    Number of Visits 25    Date for PT Re-Evaluation 07/26/24    Authorization Type [employer]    Progress Note Due on Visit 10    PT Start Time 0841    PT Stop Time 0926    PT Time Calculation (min) 45 min    Activity Tolerance Patient tolerated treatment well    Behavior During Therapy [facility] for tasks assessed/performed             Past Medical History:  Diagnosis Date   Abdominal pain 10/26/2013   Allergic rhinitis 01/25/2016   ANA positive 07/29/2013   Anemia    Arthralgia 08/18/2013   Arthritis    Asthma, allergic 08/05/2012   Atrial flutter    past history- not current   Atypical chest pain 01/01/2015   Bilateral carpal tunnel syndrome 03/04/2015   CAD in native artery 08/25/2010   Minor CAD 2009, 2011, low risk Myoview  2013 and 2016        Cataract    bilaterally removed    Cellulitis of breast 02/05/2023   Chronic constipation    Chronic cough 01/09/2017   Chronic interstitial cystitis 02/01/2017   Chronic night sweats 01/08/2015   Common bile duct dilatation 02/13/2023   Concussion    x 3   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   DDD (degenerative disc disease), cervical 09/17/2018   Degenerative scoliosis 04/22/2018   Deviated septum 05/12/2016   Dysphagia    Dyspnea    Dysuria 03/09/2021   Elevated alkaline phosphatase level 01/25/2023   Essential hypertension 08/25/2010   External hemorrhoids 02/05/2015   Facial trauma 01/22/2023   Family history of adverse reaction to anesthesia    Family history of malignant hyperthermia    father had this   Fibrocystic breast disease 10/18/2010   Fibromyalgia    Frequency of urination    Generalized anxiety disorder 08/03/2010   GERD (gastroesophageal reflux disease)     Headache 10/13/2013   High serum vitamin D  05/18/2021   History of chronic bronchitis    History of colonic polyp 02/25/2019   History of hiatal hernia    History of tachycardia    CONTROLLED  WITH ATENOLOL    Hyperglycemia 03/08/2021   Hyperkalemia 09/28/2022   Hyperlipidemia    Intertrigo 12/13/2022   Kidney stone 02/26/2019   Knee pain, right 04/30/2015   Left hip pain 04/30/2015   Leg pain 02/24/2011   Qualifier: Diagnosis of   By: Jalene Mayor DOAdel Holt         Leukocytosis 07/05/2022   Local reaction to hymenoptera sting 05/09/2023   Low back pain 09/08/2020   Major depressive disorder 02/05/2013   Malignant neoplasm of lower-outer quadrant of right breast of female, estrogen receptor positive 01/03/2013   Nasal turbinate hypertrophy 05/12/2016   Oral herpes 02/05/2023   OSA and COPD overlap syndrome 06/10/2020   Osteoporosis 01/2019   T score -2.2 stable/improved from prior study   Pelvic pain    Peripheral neuropathy 05/22/2014   Personal history of radiation therapy    Pneumonia    Primary insomnia 06/25/2017   Pulmonary nodules 02/12/2015   Rash and other nonspecific skin eruption 05/09/2023   Recurrent falls 05/02/2020   Recurrent sinusitis 02/25/2019  Right arm pain 07/20/2016   RLS (restless legs syndrome) 11/09/2019   RUQ pain 07/05/2022   S/P radiation therapy 11/12/12 - 12/05/12   right Breast   Sciatica 04/30/2015   Sepsis 2014   from UTI    Sjogren's disease 12/12/2021   Splenic lesion 09/02/2012   Sprain of lateral ligament of ankle joint 07/31/2023   Stage 3 chronic kidney disease    Statin intolerance 02/29/2020   Stricture and stenosis of esophagus 10/19/2011   Tongue lesion 02/05/2023   Tremor 07/05/2022   Urgency of urination    Vertigo 01/22/2023   Vitamin B12 deficiency 03/08/2021   Vocal cord cyst 05/18/2021   Past Surgical History:  Procedure Laterality Date   24 HOUR PH STUDY N/A 04/03/2016   Procedure: 24 HOUR PH STUDY;  Surgeon:  Danette Duos, MD;  Location: [facility];  Service: Gastroenterology;  Laterality: N/A;   ANKLE ARTHROSCOPY WITH RECONSTRUCTION Left 03/12/2024   Procedure: ANKLE ARTHROSCOPY WITH OPEN LATERAL ANKLE LIGAMENT STABILIZATION;  Surgeon: Ali Ink, MD;  Location: [facility];  Service: Orthopedics;  Laterality: Left;   BREAST BIOPSY Right 08/23/2012   ADH   BREAST BIOPSY Right 10/11/2012   Ductal Carcinoma   BREAST EXCISIONAL BIOPSY Right 09/04/2013   benign   BREAST LUMPECTOMY Right 10/11/2012   W/ SLN BX   CARDIAC CATHETERIZATION  09-13-2007  DR [name] Null   WELL-PRESERVED LVF/  DIFFUSE SCATTERED CORONARY CALCIFACATION AND ATHEROSCLEROSIS WITHOUT OBSTRUCTION   CARDIAC CATHETERIZATION  08-04-2010  DR MCALHANY   NON-OBSTRUCTIVE CAD/  pLAD 40%/  oLAD 30%/  mLAD 30%/  pRCA 30%/  EF 60%   CARDIOVASCULAR STRESS TEST  06-18-2012  DR McALHANY   LOW RISK NUCLEAR STUDY/  SMALL FIXED AREA OF MODERATELY DECREASED UPTAKE IN ANTEROSEPTAL WALL WHICH MAY BE ARTIFACTUAL/  NO ISCHEMIA/  EF 68%   COLONOSCOPY  09/29/2010   CYSTOSCOPY     CYSTOSCOPY WITH HYDRODISTENSION AND BIOPSY N/A 03/06/2014   Procedure: CYSTOSCOPY/HYDRODISTENSION/ INSTILATION OF MARCAINE  AND PYRIDIUM ;  Surgeon: Edmund Gouge, MD;  Location: [facility];  Service: Urology;  Laterality: N/A;   CYSTOSCOPY/URETEROSCOPY/HOLMIUM LASER/STENT PLACEMENT Left 03/21/2023   Procedure: CYSTOSCOPY LEFT RETROGRADE PYELOGRAM, LEFT URETEROSCOPY, HOLMIUM LASER LITHOTRIPSYM, AND LEFT URETERAL STENT PLACEMENT;  Surgeon: Adelbert Homans, MD;  Location: [facility];  Service: Urology;  Laterality: Left;  60 MINUTES   ESOPHAGEAL MANOMETRY N/A 04/03/2016   Procedure: ESOPHAGEAL MANOMETRY (EM);  Surgeon: Danette Duos, MD;  Location: [facility];  Service: Gastroenterology;  Laterality: N/A;   EXTRACORPOREAL SHOCK WAVE LITHOTRIPSY Left 02/06/2019   Procedure: EXTRACORPOREAL SHOCK WAVE LITHOTRIPSY  (ESWL);  Surgeon: Ottelin, Mark, MD;  Location: [facility];  Service: Urology;  Laterality: Left;   NASAL SINUS SURGERY  1985   ORIF RIGHT ANKLE  FX  2006   POLYPECTOMY     REMOVAL VOCAL CORD CYST  01/2013   REPAIR OF PERONEUS BREVIS TENDON Left 03/12/2024   Procedure: REPAIR OF PERONEUS TENDON;  Surgeon: Ali Ink, MD;  Location: [facility];  Service: Orthopedics;  Laterality: Left;   RIGHT BREAST BX  08/23/2012   RIGHT HAND SURGERY  X3  LAST ONE 2009   INCLUDES  ORIF RIGHT 5TH FINGER AND REVISION TWICE   SKIN CANCER EXCISION     face,arms   TONSILLECTOMY AND ADENOIDECTOMY  AGE 58   TOTAL ABDOMINAL HYSTERECTOMY W/ BILATERAL SALPINGOOPHORECTOMY  1982   W/  APPENDECTOMY   TRANSTHORACIC ECHOCARDIOGRAM  06/24/2012   GRADE I  DIASTOLIC DYSFUNCTION/  EF 55-60%/  MILD MR   UPPER GASTROINTESTINAL ENDOSCOPY     Patient Active Problem List   Diagnosis Date Noted   Otalgia of both ears 04/23/2024   Tinnitus of both ears 04/23/2024   Hypokalemia 12/10/2023   IDA (iron deficiency anemia) 09/12/2023   Stage 3 chronic kidney disease    Right ankle pain 07/31/2023   Common bile duct dilatation 02/13/2023   Oral herpes 02/05/2023   Elevated alkaline phosphatase level 01/25/2023   Vertigo 01/22/2023   Intertrigo 12/13/2022   Hyperkalemia 09/28/2022   Anemia 09/28/2022   Tremor 07/05/2022   RUQ pain 07/05/2022   Leukocytosis 07/05/2022   Sjogren's disease 12/12/2021   Vocal cord cyst 05/18/2021   High serum vitamin D  05/18/2021   Dysuria 03/09/2021   Vitamin B12 deficiency 03/08/2021   Hyperglycemia 03/08/2021   OSA and COPD overlap syndrome 06/10/2020   Recurrent falls 05/02/2020   Statin intolerance 02/29/2020   RLS (restless legs syndrome) 11/09/2019   Recurrent sinusitis 02/25/2019   History of colonic polyp 02/25/2019   DDD (degenerative disc disease), cervical 09/17/2018   Degenerative scoliosis 04/22/2018   Primary insomnia 06/25/2017   Chronic interstitial  cystitis 02/01/2017   Deviated nasal septum 05/12/2016   Hypertrophy of nasal turbinates 05/12/2016   Dysphagia    Allergic rhinitis 01/25/2016   Dyspnea    Sciatica 04/30/2015   Bilateral carpal tunnel syndrome 03/04/2015   Hyperlipidemia 03/01/2015   Pulmonary nodules 02/12/2015   External hemorrhoids 02/05/2015   Chronic night sweats 01/08/2015   Peripheral neuropathy 05/22/2014   Headache 10/13/2013   Arthralgia 08/18/2013   ANA positive 07/29/2013   Depression with anxiety 02/05/2013   Malignant neoplasm of lower-outer quadrant of right breast of female, estrogen receptor positive 01/03/2013   Asthma, allergic 08/05/2012   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   GERD (gastroesophageal reflux disease) 06/03/2012   Stricture and stenosis of esophagus 10/19/2011   Osteoporosis 04/17/2011   Fibrocystic breast disease 10/18/2010   Essential hypertension 08/25/2010   CAD in native artery 08/25/2010   Generalized anxiety disorder 08/03/2010   Bronchitis 03/10/2010   Fibromyalgia 01/11/2010    PCP: Neda Balk, MD   REFERRING PROVIDER: Ali Ink, MD   REFERRING DIAG: [phone] (ICD-10-CM) - Peroneal tendinitis, left leg   THERAPY DIAG:  Pain in left ankle and joints of left foot  Muscle weakness (generalized)  Other abnormalities of gait and mobility  Localized edema  Rationale for Evaluation and Treatment: Rehabilitation  ONSET DATE: 03/12/24  SUBJECTIVE:   SUBJECTIVE STATEMENT: Patient reports she had reconstructive ankle surgery on 3/19. She didn't know it was going to hurt and it has hurt ever since the surgery. She just started walking in the ankle brace last week and loves it much more than the boot. She felt more secure in the boot, but the brace is easier to deal with. She is and was currently using SPC. She does report occasional dizziness, which is why she uses the cane. She reports difficulty with stair negotiation and walking long distances. She has  her grand daughters graduation in a few weeks and wants to be able to go up the stairs to sit with her family. She also reports she doesn't have much energy and wants to get more energy.   PERTINENT HISTORY: S/p Lt ankle arthroscopy Brostrom stabilization, peroneus brevis and longus debridement and tenosynovectomy 03/12/24 See extensive PMH above  PAIN:  Are you having pain? Yes: NPRS scale: 4 Pain location:  Lt lateral ankle Pain description: throbbing, sharp Aggravating factors: palpation, rubbing it on the sheet (wakes every hour), walking Relieving factors: pain medication  PRECAUTIONS: Fall,progress per protocol   RED FLAGS: None   WEIGHT BEARING RESTRICTIONS: Yes LLE WBAT In ASO brace   FALLS:  Has patient fallen in last 6 months? Yes. Number of falls 2 (fell stepping up grassy hill, fell in her kitchen from turning quickly)  LIVING ENVIRONMENT: Lives with: lives with their spouse Lives in: House/apartment Stairs: Yes: Internal: 16 steps; on left going up Has following equipment at home: Single point cane  OCCUPATION: retired   PLOF: Independent  PATIENT GOALS: "I want to be able to walk again."  NEXT MD VISIT: End of June 2025   OBJECTIVE:  Note: Objective measures were completed at Evaluation unless otherwise noted.  DIAGNOSTIC FINDINGS: none on file   PATIENT SURVEYS:  FADI: 46/104; 44%  COGNITION: Overall cognitive status: Within functional limits for tasks assessed     SENSATION: See palpation  EDEMA:  Mild swelling about Lt ankle    PALPATION: Hypersensitivity to palpation about distal fibula, lateral malleolus   LOWER EXTREMITY ROM:  Active ROM Right eval Left eval  Hip flexion    Hip extension    Hip abduction    Hip adduction    Hip internal rotation    Hip external rotation    Knee flexion    Knee extension    Ankle dorsiflexion  6  Ankle plantarflexion  50  Ankle inversion  15  Ankle eversion  8   (Blank rows = not  tested)  LOWER EXTREMITY MMT:  MMT Right eval Left eval  Hip flexion    Hip extension    Hip abduction    Hip adduction    Hip internal rotation    Hip external rotation    Knee flexion    Knee extension    Ankle dorsiflexion  3+  Ankle plantarflexion  3+ sitting  Ankle inversion  3+  Ankle eversion  3+   (Blank rows = not tested)    FUNCTIONAL TESTS:  TUG: 13.5 seconds Romberg x 30 seconds Semi-tandem unable   GAIT: Distance walked: 20 ft  Assistive device utilized: Single point cane. Ankle brace Level of assistance: Modified independence Comments: limited push-off LLE, foot ER,    [facility] Adult PT Treatment:                                                DATE: 04/28/24 Therapeutic Exercise: Demonstrated,performed, and issued initial HEP.   Self Care: Ice for pain/swelling  Densensitization techniques    PATIENT EDUCATION:  Education details: see treatment Person educated: Patient Education method: Explanation, Demonstration, Tactile cues, Verbal cues, and Handouts Education comprehension: verbalized understanding, returned demonstration, verbal cues required, tactile cues required, and needs further education  HOME EXERCISE PROGRAM: Access Code: [email or web address] URL: [email or web address] Date: 04/28/2024 Prepared by: Forrestine Ike  Exercises - Long Sitting Calf Stretch with Strap  - 2 x daily - 7 x weekly - 3 sets - 30s ec  hold - Seated Toe Raise  - 2 x daily - 7 x weekly - 2 sets - 10 reps - Seated Heel Raise  - 2 x daily - 7 x weekly - 2 sets - 10 reps - Seated Ankle Circles  - 2 x daily - 7 x  weekly - 2 sets - 10 reps  Patient Education - Rubbing with Different Textures  ASSESSMENT:  CLINICAL IMPRESSION: Patient is a 78 y.o. female who was seen today for physical therapy evaluation and treatment for s/p Lt ankle arthroscopy Brostrom stabilization, peroneus brevis and longus debridement and tenosynovectomy on 03/12/24. She demonstrates ROM,  strength, gait and balance deficits that are consistent with her recent post-operative status. She will benefit from skilled PT to address the above stated deficits in order to return to optimal function.   OBJECTIVE IMPAIRMENTS: Abnormal gait, decreased activity tolerance, decreased balance, decreased endurance, decreased knowledge of condition, decreased mobility, difficulty walking, decreased ROM, decreased strength, increased edema, impaired flexibility, impaired sensation, improper body mechanics, postural dysfunction, and pain.   ACTIVITY LIMITATIONS: carrying, lifting, bending, standing, squatting, sleeping, stairs, transfers, and locomotion level  PARTICIPATION LIMITATIONS: meal prep, cleaning, laundry, shopping, and community activity  PERSONAL FACTORS: Age, Fitness, Time since onset of injury/illness/exacerbation, and 3+ comorbidities: see PMH above  are also affecting patient's functional outcome.   REHAB POTENTIAL: Good  CLINICAL DECISION MAKING: Evolving/moderate complexity  EVALUATION COMPLEXITY: Moderate   GOALS: Goals reviewed with patient? Yes  SHORT TERM GOALS: Target date: 06/09/2024   Patient will be independent and compliant with initial HEP.   Baseline: issued at eval  Goal status: INITIAL  2.  Patient will demonstrate at least 60 degrees of Lt ankle plantarflexion AROM to improve push-off during gait cycle.  Baseline: see above Goal status: INITIAL  3.  Patient will demonstrate at least 10 degrees of Lt ankle dorsiflexion AROM to improve gait mechanics.  Baseline: see above Goal status: INITIAL  4.  Patient will complete TUG in </= 12 seconds to reduce fall risk.  Baseline: see above Goal status: INITIAL    LONG TERM GOALS: Target date: 07/26/24  Patient will score >/= 60/104 on the Foot and Ankle Disability Index (MDC 7) to signify clinically meaningful improvement in functional abilities.   Baseline: see above Goal status: INITIAL  2.  Patient  will maintain tandem stance for at least 10 seconds to improve gait stability.  Baseline: see above Goal status: INITIAL  3.  Patient will be modified independent with curb/stair negotiation.  Baseline: uses cane and requires support from spouse  Goal status: INITIAL  4.  Patient will self-report ability to tolerate at least 30 minutes of standing/walking activity.  Baseline: 10 minutes  Goal status: INITIAL  5.  Patient will demonstrate at least 4/5 Lt ankle strength to improve stability when navigating on uneven terrain.  Baseline: see above  Goal status: INITIAL  6.  Patient will be independent with advanced home program to progress/maintain current level of function.  Baseline: initial HEP issued  Goal status: INITIAL   PLAN:  PT FREQUENCY: 2x/week  PT DURATION: 12 weeks  PLANNED INTERVENTIONS: 97164- PT Re-evaluation, 97750- Physical Performance Testing, 97110-Therapeutic exercises, 97530- Therapeutic activity, [id number]- Neuromuscular re-education, 97535- Self Care, 11914- Manual therapy, [id number]- Gait training, [id number]- Aquatic Therapy, 97016- Vasopneumatic device, Taping, Dry Needling, Cryotherapy, and Moist heat  PLAN FOR NEXT SESSION: review and progress HEP; ankle AROM, introduce ankle isometrics, static balance activity. Utilize protocol on file    Aidenn Skellenger, PT, DPT, ATC 04/28/24 12:08 PM

## 2024-04-25 NOTE — Telephone Encounter (Unsigned)
 Copied from CRM [id number]. Topic: Clinical - Medication Refill >> Apr 25, 2024  9:34 AM [place] J wrote: Most Recent Primary Care Visit:  Provider: Randie Bustle A  Department: [facility]  Visit Type: OFFICE VISIT  Date: 04/21/2024  Medication: fluticasone  (FLONASE ) 50 MCG/ACT nasal spray   Has the patient contacted their pharmacy? Yes (Agent: If no, request that the patient contact the pharmacy for the refill. If patient does not wish to contact the pharmacy document the reason why and proceed with request.) (Agent: If yes, when and what did the pharmacy advise?) Pharmacy instructed patient to call primary.  Is this the correct pharmacy for this prescription? Yes If no, delete pharmacy and type the correct one.  This is the patient's preferred pharmacy:  [facility] [address] [place] - [address] [place] [address] Phone: [phone] Fax: [phone]   Has the prescription been filled recently? No  Is the patient out of the medication? Yes  Has the patient been seen for an appointment in the last year OR does the patient have an upcoming appointment? Yes  Can we respond through MyChart? Yes  Agent: Please be advised that Rx refills may take up to 3 business days. We ask that you follow-up with your pharmacy.

## 2024-04-28 ENCOUNTER — Ambulatory Visit: Attending: Orthopaedic Surgery

## 2024-04-28 ENCOUNTER — Other Ambulatory Visit: Payer: Self-pay

## 2024-04-28 DIAGNOSIS — R2689 Other abnormalities of gait and mobility: Secondary | ICD-10-CM | POA: Insufficient documentation

## 2024-04-28 DIAGNOSIS — M25561 Pain in right knee: Secondary | ICD-10-CM | POA: Diagnosis not present

## 2024-04-28 DIAGNOSIS — M6281 Muscle weakness (generalized): Secondary | ICD-10-CM | POA: Diagnosis not present

## 2024-04-28 DIAGNOSIS — M810 Age-related osteoporosis without current pathological fracture: Secondary | ICD-10-CM | POA: Diagnosis not present

## 2024-04-28 DIAGNOSIS — M79606 Pain in leg, unspecified: Secondary | ICD-10-CM | POA: Diagnosis not present

## 2024-04-28 DIAGNOSIS — M1990 Unspecified osteoarthritis, unspecified site: Secondary | ICD-10-CM | POA: Diagnosis not present

## 2024-04-28 DIAGNOSIS — M3501 Sicca syndrome with keratoconjunctivitis: Secondary | ICD-10-CM | POA: Diagnosis not present

## 2024-04-28 DIAGNOSIS — R600 Localized edema: Secondary | ICD-10-CM | POA: Insufficient documentation

## 2024-04-28 DIAGNOSIS — N189 Chronic kidney disease, unspecified: Secondary | ICD-10-CM | POA: Diagnosis not present

## 2024-04-28 DIAGNOSIS — M797 Fibromyalgia: Secondary | ICD-10-CM | POA: Diagnosis not present

## 2024-04-28 DIAGNOSIS — M25572 Pain in left ankle and joints of left foot: Secondary | ICD-10-CM | POA: Insufficient documentation

## 2024-04-28 DIAGNOSIS — M353 Polymyalgia rheumatica: Secondary | ICD-10-CM | POA: Diagnosis not present

## 2024-04-28 DIAGNOSIS — Z79899 Other long term (current) drug therapy: Secondary | ICD-10-CM | POA: Diagnosis not present

## 2024-04-28 DIAGNOSIS — M898X9 Other specified disorders of bone, unspecified site: Secondary | ICD-10-CM | POA: Diagnosis not present

## 2024-04-28 MED ORDER — FLUTICASONE PROPIONATE 50 MCG/ACT NA SUSP: 2.0000 | Freq: Every day | NASAL | 5 refills | Status: AC

## 2024-04-30 ENCOUNTER — Encounter: Payer: Self-pay | Admitting: Professional

## 2024-04-30 ENCOUNTER — Ambulatory Visit (INDEPENDENT_AMBULATORY_CARE_PROVIDER_SITE_OTHER): Admitting: Professional

## 2024-04-30 DIAGNOSIS — F411 Generalized anxiety disorder: Secondary | ICD-10-CM | POA: Diagnosis not present

## 2024-04-30 DIAGNOSIS — F331 Major depressive disorder, recurrent, moderate: Secondary | ICD-10-CM

## 2024-04-30 DIAGNOSIS — F4310 Post-traumatic stress disorder, unspecified: Secondary | ICD-10-CM
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
## 2024-05-01 ENCOUNTER — Encounter: Payer: Self-pay | Admitting: Emergency Medicine

## 2024-05-01 ENCOUNTER — Ambulatory Visit

## 2024-05-01 DIAGNOSIS — M25572 Pain in left ankle and joints of left foot: Secondary | ICD-10-CM | POA: Diagnosis not present

## 2024-05-01 DIAGNOSIS — R102 Pelvic and perineal pain: Secondary | ICD-10-CM | POA: Diagnosis not present

## 2024-05-01 DIAGNOSIS — R600 Localized edema: Secondary | ICD-10-CM

## 2024-05-01 DIAGNOSIS — M6281 Muscle weakness (generalized): Secondary | ICD-10-CM

## 2024-05-01 DIAGNOSIS — R2689 Other abnormalities of gait and mobility: Secondary | ICD-10-CM

## 2024-05-01 DIAGNOSIS — N3020 Other chronic cystitis without hematuria: Secondary | ICD-10-CM | POA: Diagnosis not present

## 2024-05-01 NOTE — Therapy (Signed)
 OUTPATIENT PHYSICAL THERAPY LOWER EXTREMITY TREATMENT   Patient Name: [name] MRN: [id number] DOB:Mar 29, 1946, 78 y.o., female Today's Date: 05/01/2024  END OF SESSION:  PT End of Session - 05/01/24 0933     Visit Number 2    Number of Visits 25    Date for PT Re-Evaluation 07/26/24    Authorization Type [employer]    Progress Note Due on Visit 10    PT Start Time 0932    PT Stop Time 1011    PT Time Calculation (min) 39 min    Activity Tolerance Patient tolerated treatment well    Behavior During Therapy [facility] for tasks assessed/performed              Past Medical History:  Diagnosis Date   Abdominal pain 10/26/2013   Allergic rhinitis 01/25/2016   ANA positive 07/29/2013   Anemia    Arthralgia 08/18/2013   Arthritis    Asthma, allergic 08/05/2012   Atrial flutter    past history- not current   Atypical chest pain 01/01/2015   Bilateral carpal tunnel syndrome 03/04/2015   CAD in native artery 08/25/2010   Minor CAD 2009, 2011, low risk Myoview  2013 and 2016        Cataract    bilaterally removed    Cellulitis of breast 02/05/2023   Chronic constipation    Chronic cough 01/09/2017   Chronic interstitial cystitis 02/01/2017   Chronic night sweats 01/08/2015   Common bile duct dilatation 02/13/2023   Concussion    x 3   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   DDD (degenerative disc disease), cervical 09/17/2018   Degenerative scoliosis 04/22/2018   Deviated septum 05/12/2016   Dysphagia    Dyspnea    Dysuria 03/09/2021   Elevated alkaline phosphatase level 01/25/2023   Essential hypertension 08/25/2010   External hemorrhoids 02/05/2015   Facial trauma 01/22/2023   Family history of adverse reaction to anesthesia    Family history of malignant hyperthermia    father had this   Fibrocystic breast disease 10/18/2010   Fibromyalgia    Frequency of urination    Generalized anxiety disorder 08/03/2010   GERD (gastroesophageal reflux disease)     Headache 10/13/2013   High serum vitamin D  05/18/2021   History of chronic bronchitis    History of colonic polyp 02/25/2019   History of hiatal hernia    History of tachycardia    CONTROLLED  WITH ATENOLOL    Hyperglycemia 03/08/2021   Hyperkalemia 09/28/2022   Hyperlipidemia    Intertrigo 12/13/2022   Kidney stone 02/26/2019   Knee pain, right 04/30/2015   Left hip pain 04/30/2015   Leg pain 02/24/2011   Qualifier: Diagnosis of   By: Jalene Mayor DOAdel Holt         Leukocytosis 07/05/2022   Local reaction to hymenoptera sting 05/09/2023   Low back pain 09/08/2020   Major depressive disorder 02/05/2013   Malignant neoplasm of lower-outer quadrant of right breast of female, estrogen receptor positive 01/03/2013   Nasal turbinate hypertrophy 05/12/2016   Oral herpes 02/05/2023   OSA and COPD overlap syndrome 06/10/2020   Osteoporosis 01/2019   T score -2.2 stable/improved from prior study   Pelvic pain    Peripheral neuropathy 05/22/2014   Personal history of radiation therapy    Pneumonia    Primary insomnia 06/25/2017   Pulmonary nodules 02/12/2015   Rash and other nonspecific skin eruption 05/09/2023   Recurrent falls 05/02/2020   Recurrent sinusitis 02/25/2019  Right arm pain 07/20/2016   RLS (restless legs syndrome) 11/09/2019   RUQ pain 07/05/2022   S/P radiation therapy 11/12/12 - 12/05/12   right Breast   Sciatica 04/30/2015   Sepsis 2014   from UTI    Sjogren's disease 12/12/2021   Splenic lesion 09/02/2012   Sprain of lateral ligament of ankle joint 07/31/2023   Stage 3 chronic kidney disease    Statin intolerance 02/29/2020   Stricture and stenosis of esophagus 10/19/2011   Tongue lesion 02/05/2023   Tremor 07/05/2022   Urgency of urination    Vertigo 01/22/2023   Vitamin B12 deficiency 03/08/2021   Vocal cord cyst 05/18/2021   Past Surgical History:  Procedure Laterality Date   24 HOUR PH STUDY N/A 04/03/2016   Procedure: 24 HOUR PH STUDY;  Surgeon:  Danette Duos, MD;  Location: [facility];  Service: Gastroenterology;  Laterality: N/A;   ANKLE ARTHROSCOPY WITH RECONSTRUCTION Left 03/12/2024   Procedure: ANKLE ARTHROSCOPY WITH OPEN LATERAL ANKLE LIGAMENT STABILIZATION;  Surgeon: Ali Ink, MD;  Location: [facility];  Service: Orthopedics;  Laterality: Left;   BREAST BIOPSY Right 08/23/2012   ADH   BREAST BIOPSY Right 10/11/2012   Ductal Carcinoma   BREAST EXCISIONAL BIOPSY Right 09/04/2013   benign   BREAST LUMPECTOMY Right 10/11/2012   W/ SLN BX   CARDIAC CATHETERIZATION  09-13-2007  DR [name] Null   WELL-PRESERVED LVF/  DIFFUSE SCATTERED CORONARY CALCIFACATION AND ATHEROSCLEROSIS WITHOUT OBSTRUCTION   CARDIAC CATHETERIZATION  08-04-2010  DR MCALHANY   NON-OBSTRUCTIVE CAD/  pLAD 40%/  oLAD 30%/  mLAD 30%/  pRCA 30%/  EF 60%   CARDIOVASCULAR STRESS TEST  06-18-2012  DR McALHANY   LOW RISK NUCLEAR STUDY/  SMALL FIXED AREA OF MODERATELY DECREASED UPTAKE IN ANTEROSEPTAL WALL WHICH MAY BE ARTIFACTUAL/  NO ISCHEMIA/  EF 68%   COLONOSCOPY  09/29/2010   CYSTOSCOPY     CYSTOSCOPY WITH HYDRODISTENSION AND BIOPSY N/A 03/06/2014   Procedure: CYSTOSCOPY/HYDRODISTENSION/ INSTILATION OF MARCAINE  AND PYRIDIUM ;  Surgeon: Edmund Gouge, MD;  Location: [facility];  Service: Urology;  Laterality: N/A;   CYSTOSCOPY/URETEROSCOPY/HOLMIUM LASER/STENT PLACEMENT Left 03/21/2023   Procedure: CYSTOSCOPY LEFT RETROGRADE PYELOGRAM, LEFT URETEROSCOPY, HOLMIUM LASER LITHOTRIPSYM, AND LEFT URETERAL STENT PLACEMENT;  Surgeon: Adelbert Homans, MD;  Location: [facility];  Service: Urology;  Laterality: Left;  60 MINUTES   ESOPHAGEAL MANOMETRY N/A 04/03/2016   Procedure: ESOPHAGEAL MANOMETRY (EM);  Surgeon: Danette Duos, MD;  Location: [facility];  Service: Gastroenterology;  Laterality: N/A;   EXTRACORPOREAL SHOCK WAVE LITHOTRIPSY Left 02/06/2019   Procedure: EXTRACORPOREAL SHOCK WAVE LITHOTRIPSY  (ESWL);  Surgeon: Ottelin, Mark, MD;  Location: [facility];  Service: Urology;  Laterality: Left;   NASAL SINUS SURGERY  1985   ORIF RIGHT ANKLE  FX  2006   POLYPECTOMY     REMOVAL VOCAL CORD CYST  01/2013   REPAIR OF PERONEUS BREVIS TENDON Left 03/12/2024   Procedure: REPAIR OF PERONEUS TENDON;  Surgeon: Ali Ink, MD;  Location: [facility];  Service: Orthopedics;  Laterality: Left;   RIGHT BREAST BX  08/23/2012   RIGHT HAND SURGERY  X3  LAST ONE 2009   INCLUDES  ORIF RIGHT 5TH FINGER AND REVISION TWICE   SKIN CANCER EXCISION     face,arms   TONSILLECTOMY AND ADENOIDECTOMY  AGE 67   TOTAL ABDOMINAL HYSTERECTOMY W/ BILATERAL SALPINGOOPHORECTOMY  1982   W/  APPENDECTOMY   TRANSTHORACIC ECHOCARDIOGRAM  06/24/2012   GRADE I  DIASTOLIC DYSFUNCTION/  EF 55-60%/  MILD MR   UPPER GASTROINTESTINAL ENDOSCOPY     Patient Active Problem List   Diagnosis Date Noted   Otalgia of both ears 04/23/2024   Tinnitus of both ears 04/23/2024   Hypokalemia 12/10/2023   IDA (iron deficiency anemia) 09/12/2023   Stage 3 chronic kidney disease    Right ankle pain 07/31/2023   Common bile duct dilatation 02/13/2023   Oral herpes 02/05/2023   Elevated alkaline phosphatase level 01/25/2023   Vertigo 01/22/2023   Intertrigo 12/13/2022   Hyperkalemia 09/28/2022   Anemia 09/28/2022   Tremor 07/05/2022   RUQ pain 07/05/2022   Leukocytosis 07/05/2022   Sjogren's disease 12/12/2021   Vocal cord cyst 05/18/2021   High serum vitamin D  05/18/2021   Dysuria 03/09/2021   Vitamin B12 deficiency 03/08/2021   Hyperglycemia 03/08/2021   OSA and COPD overlap syndrome 06/10/2020   Recurrent falls 05/02/2020   Statin intolerance 02/29/2020   RLS (restless legs syndrome) 11/09/2019   Recurrent sinusitis 02/25/2019   History of colonic polyp 02/25/2019   DDD (degenerative disc disease), cervical 09/17/2018   Degenerative scoliosis 04/22/2018   Primary insomnia 06/25/2017   Chronic interstitial  cystitis 02/01/2017   Deviated nasal septum 05/12/2016   Hypertrophy of nasal turbinates 05/12/2016   Dysphagia    Allergic rhinitis 01/25/2016   Dyspnea    Sciatica 04/30/2015   Bilateral carpal tunnel syndrome 03/04/2015   Hyperlipidemia 03/01/2015   Pulmonary nodules 02/12/2015   External hemorrhoids 02/05/2015   Chronic night sweats 01/08/2015   Peripheral neuropathy 05/22/2014   Headache 10/13/2013   Arthralgia 08/18/2013   ANA positive 07/29/2013   Depression with anxiety 02/05/2013   Malignant neoplasm of lower-outer quadrant of right breast of female, estrogen receptor positive 01/03/2013   Asthma, allergic 08/05/2012   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   GERD (gastroesophageal reflux disease) 06/03/2012   Stricture and stenosis of esophagus 10/19/2011   Osteoporosis 04/17/2011   Fibrocystic breast disease 10/18/2010   Essential hypertension 08/25/2010   CAD in native artery 08/25/2010   Generalized anxiety disorder 08/03/2010   Bronchitis 03/10/2010   Fibromyalgia 01/11/2010    PCP: Neda Balk, MD   REFERRING PROVIDER: Ali Ink, MD   REFERRING DIAG: [id number] (ICD-10-CM) - Peroneal tendinitis, left leg   THERAPY DIAG:  Pain in left ankle and joints of left foot  Muscle weakness (generalized)  Other abnormalities of gait and mobility  Localized edema  Rationale for Evaluation and Treatment: Rehabilitation  ONSET DATE: 03/12/24  SUBJECTIVE:   SUBJECTIVE STATEMENT: Patient reports sleep has been difficult due to the pain and thinks it's her neuropathy. The ice has been helpful.   PERTINENT HISTORY: S/p Lt ankle arthroscopy Brostrom stabilization, peroneus brevis and longus debridement and tenosynovectomy 03/12/24 See extensive PMH above  PAIN:  Are you having pain? Yes: NPRS scale: 6 Pain location: Lt lateral ankle Pain description: throbbing, sharp Aggravating factors: palpation, rubbing it on the sheet (wakes every hour),  walking Relieving factors: pain medication  PRECAUTIONS: Fall,progress per protocol   RED FLAGS: None   WEIGHT BEARING RESTRICTIONS: Yes LLE WBAT In ASO brace   FALLS:  Has patient fallen in last 6 months? Yes. Number of falls 2 (fell stepping up grassy hill, fell in her kitchen from turning quickly)  LIVING ENVIRONMENT: Lives with: lives with their spouse Lives in: House/apartment Stairs: Yes: Internal: 16 steps; on left going up Has following equipment at home: Single point cane  OCCUPATION: retired  PLOF: Independent  PATIENT GOALS: "I want to be able to walk again."  NEXT MD VISIT: End of June 2025   OBJECTIVE:  Note: Objective measures were completed at Evaluation unless otherwise noted.  DIAGNOSTIC FINDINGS: none on file   PATIENT SURVEYS:  FADI: 46/104; 44%  COGNITION: Overall cognitive status: Within functional limits for tasks assessed     SENSATION: See palpation  EDEMA:  Mild swelling about Lt ankle    PALPATION: Hypersensitivity to palpation about distal fibula, lateral malleolus   LOWER EXTREMITY ROM:  Active ROM Right eval Left eval  Hip flexion    Hip extension    Hip abduction    Hip adduction    Hip internal rotation    Hip external rotation    Knee flexion    Knee extension    Ankle dorsiflexion  6  Ankle plantarflexion  50  Ankle inversion  15  Ankle eversion  8   (Blank rows = not tested)  LOWER EXTREMITY MMT:  MMT Right eval Left eval  Hip flexion    Hip extension    Hip abduction    Hip adduction    Hip internal rotation    Hip external rotation    Knee flexion    Knee extension    Ankle dorsiflexion  3+  Ankle plantarflexion  3+ sitting  Ankle inversion  3+  Ankle eversion  3+   (Blank rows = not tested)    FUNCTIONAL TESTS:  TUG: 13.5 seconds Romberg x 30 seconds Semi-tandem unable   GAIT: Distance walked: 20 ft  Assistive device utilized: Single point cane. Ankle brace Level of assistance:  Modified independence Comments: limited push-off LLE, foot ER,   [facility] Adult PT Treatment:                                                DATE: 05/01/24 Therapeutic Exercise: Seated calf raise 2 x 10  Seated toe raise 2 x 10  Calf stretch with towel x 30 sec  Ankle circles x 10 each CW/CCW  Neuromuscular re-ed: Isometric ankle inversion 2 x 10; 5 sec hold  Isometric ankle eversion 2 x 10; 5 sec hold  Resisted ankle plantarflexion red band 2 x 10  Therapeutic Activity: Adjusted SPC to appropriate height and walked 2 laps in clinic    [facility] Adult PT Treatment:                                                DATE: 04/28/24 Therapeutic Exercise: Demonstrated,performed, and issued initial HEP.   Self Care: Ice for pain/swelling  Densensitization techniques    PATIENT EDUCATION:  Education details:hep update Person educated: Patient Education method: Explanation, Demonstration, Tactile cues, Verbal cues, and Handouts Education comprehension: verbalized understanding, returned demonstration, verbal cues required, tactile cues required, and needs further education  HOME EXERCISE PROGRAM: Access Code: [email or web address] URL: [email or web address] Date: 05/01/2024 Prepared by: Forrestine Ike  Exercises - Long Sitting Calf Stretch with Strap  - 2 x daily - 7 x weekly - 3 sets - 30s ec  hold - Seated Toe Raise  - 2 x daily - 7 x weekly - 2 sets - 10 reps - Seated Heel Raise  - 2 x  daily - 7 x weekly - 2 sets - 10 reps - Seated Ankle Circles  - 2 x daily - 7 x weekly - 2 sets - 10 reps - Isometric Ankle Inversion  - 2 x daily - 7 x weekly - 2 sets - 10 reps - 5 sec  hold  ASSESSMENT:  CLINICAL IMPRESSION: Patient tolerated session well today focusing on ankle mobility and gentle strengthening. We reviewed HEP with patient requiring cues for proper setup and completion of calf stretch, otherwise demonstrates independence. She reported mild ache about lateral ankle towards end of  prescribed reps with isometric eversion and resisted plantarflexion, otherwise no complaints with today's progression. SPC was adjusted to appropriate height with patient reporting improved gait stability following adjustment.   EVAL: Patient is a 78 y.o. female who was seen today for physical therapy evaluation and treatment for s/p Lt ankle arthroscopy Brostrom stabilization, peroneus brevis and longus debridement and tenosynovectomy on 03/12/24. She demonstrates ROM, strength, gait and balance deficits that are consistent with her recent post-operative status. She will benefit from skilled PT to address the above stated deficits in order to return to optimal function.   OBJECTIVE IMPAIRMENTS: Abnormal gait, decreased activity tolerance, decreased balance, decreased endurance, decreased knowledge of condition, decreased mobility, difficulty walking, decreased ROM, decreased strength, increased edema, impaired flexibility, impaired sensation, improper body mechanics, postural dysfunction, and pain.   ACTIVITY LIMITATIONS: carrying, lifting, bending, standing, squatting, sleeping, stairs, transfers, and locomotion level  PARTICIPATION LIMITATIONS: meal prep, cleaning, laundry, shopping, and community activity  PERSONAL FACTORS: Age, Fitness, Time since onset of injury/illness/exacerbation, and 3+ comorbidities: see PMH above are also affecting patient's functional outcome.   REHAB POTENTIAL: Good  CLINICAL DECISION MAKING: Evolving/moderate complexity  EVALUATION COMPLEXITY: Moderate   GOALS: Goals reviewed with patient? Yes  SHORT TERM GOALS: Target date: 06/09/2024   Patient will be independent and compliant with initial HEP.   Baseline: issued at eval  Goal status: INITIAL  2.  Patient will demonstrate at least 60 degrees of Lt ankle plantarflexion AROM to improve push-off during gait cycle.  Baseline: see above Goal status: INITIAL  3.  Patient will demonstrate at least 10 degrees  of Lt ankle dorsiflexion AROM to improve gait mechanics.  Baseline: see above Goal status: INITIAL  4.  Patient will complete TUG in </= 12 seconds to reduce fall risk.  Baseline: see above Goal status: INITIAL    LONG TERM GOALS: Target date: 07/26/24  Patient will score >/= 60/104 on the Foot and Ankle Disability Index (MDC 7) to signify clinically meaningful improvement in functional abilities.   Baseline: see above Goal status: INITIAL  2.  Patient will maintain tandem stance for at least 10 seconds to improve gait stability.  Baseline: see above Goal status: INITIAL  3.  Patient will be modified independent with curb/stair negotiation.  Baseline: uses cane and requires support from spouse  Goal status: INITIAL  4.  Patient will self-report ability to tolerate at least 30 minutes of standing/walking activity.  Baseline: 10 minutes  Goal status: INITIAL  5.  Patient will demonstrate at least 4/5 Lt ankle strength to improve stability when navigating on uneven terrain.  Baseline: see above  Goal status: INITIAL  6.  Patient will be independent with advanced home program to progress/maintain current level of function.  Baseline: initial HEP issued  Goal status: INITIAL   PLAN:  PT FREQUENCY: 2x/week  PT DURATION: 12 weeks  PLANNED INTERVENTIONS: 97164- PT Re-evaluation, 97750- Physical Performance Testing,  97110-Therapeutic exercises, 97530- Therapeutic activity, [id number]- Neuromuscular re-education, 97535- Self Care, [id number]- Manual therapy, [id number]- Gait training, [phone]- Aquatic Therapy, 97016- Vasopneumatic device, Taping, Dry Needling, Cryotherapy, and Moist heat  PLAN FOR NEXT SESSION: review and progress HEP; ankle AROM, introduce ankle isometrics, static balance activity. Utilize protocol on file    Rhylen Pulido, PT, DPT, ATC 05/01/24 10:11 AM

## 2024-05-05 ENCOUNTER — Ambulatory Visit

## 2024-05-05 DIAGNOSIS — M6281 Muscle weakness (generalized): Secondary | ICD-10-CM

## 2024-05-05 DIAGNOSIS — M25572 Pain in left ankle and joints of left foot: Secondary | ICD-10-CM | POA: Diagnosis not present

## 2024-05-05 DIAGNOSIS — R600 Localized edema: Secondary | ICD-10-CM

## 2024-05-05 DIAGNOSIS — R2689 Other abnormalities of gait and mobility: Secondary | ICD-10-CM

## 2024-05-05 NOTE — Therapy (Signed)
 OUTPATIENT PHYSICAL THERAPY LOWER EXTREMITY TREATMENT   Patient Name: [name] MRN: [id number] DOB:1946/09/19, 78 y.o., female Today's Date: 05/05/2024  END OF SESSION:  PT End of Session - 05/05/24 1311     Visit Number 3    Number of Visits 25    Date for PT Re-Evaluation 07/26/24    Authorization Type [employer]    Progress Note Due on Visit 10    PT Start Time 1315    PT Stop Time 1357    PT Time Calculation (min) 42 min    Activity Tolerance Patient tolerated treatment well    Behavior During Therapy [facility] for tasks assessed/performed               Past Medical History:  Diagnosis Date   Abdominal pain 10/26/2013   Allergic rhinitis 01/25/2016   ANA positive 07/29/2013   Anemia    Arthralgia 08/18/2013   Arthritis    Asthma, allergic 08/05/2012   Atrial flutter    past history- not current   Atypical chest pain 01/01/2015   Bilateral carpal tunnel syndrome 03/04/2015   CAD in native artery 08/25/2010   Minor CAD 2009, 2011, low risk Myoview  2013 and 2016        Cataract    bilaterally removed    Cellulitis of breast 02/05/2023   Chronic constipation    Chronic cough 01/09/2017   Chronic interstitial cystitis 02/01/2017   Chronic night sweats 01/08/2015   Common bile duct dilatation 02/13/2023   Concussion    x 3   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   DDD (degenerative disc disease), cervical 09/17/2018   Degenerative scoliosis 04/22/2018   Deviated septum 05/12/2016   Dysphagia    Dyspnea    Dysuria 03/09/2021   Elevated alkaline phosphatase level 01/25/2023   Essential hypertension 08/25/2010   External hemorrhoids 02/05/2015   Facial trauma 01/22/2023   Family history of adverse reaction to anesthesia    Family history of malignant hyperthermia    father had this   Fibrocystic breast disease 10/18/2010   Fibromyalgia    Frequency of urination    Generalized anxiety disorder 08/03/2010   GERD (gastroesophageal reflux disease)     Headache 10/13/2013   High serum vitamin D  05/18/2021   History of chronic bronchitis    History of colonic polyp 02/25/2019   History of hiatal hernia    History of tachycardia    CONTROLLED  WITH ATENOLOL    Hyperglycemia 03/08/2021   Hyperkalemia 09/28/2022   Hyperlipidemia    Intertrigo 12/13/2022   Kidney stone 02/26/2019   Knee pain, right 04/30/2015   Left hip pain 04/30/2015   Leg pain 02/24/2011   Qualifier: Diagnosis of   By: Jalene Mayor DOAdel Holt         Leukocytosis 07/05/2022   Local reaction to hymenoptera sting 05/09/2023   Low back pain 09/08/2020   Major depressive disorder 02/05/2013   Malignant neoplasm of lower-outer quadrant of right breast of female, estrogen receptor positive 01/03/2013   Nasal turbinate hypertrophy 05/12/2016   Oral herpes 02/05/2023   OSA and COPD overlap syndrome 06/10/2020   Osteoporosis 01/2019   T score -2.2 stable/improved from prior study   Pelvic pain    Peripheral neuropathy 05/22/2014   Personal history of radiation therapy    Pneumonia    Primary insomnia 06/25/2017   Pulmonary nodules 02/12/2015   Rash and other nonspecific skin eruption 05/09/2023   Recurrent falls 05/02/2020   Recurrent sinusitis 02/25/2019  Right arm pain 07/20/2016   RLS (restless legs syndrome) 11/09/2019   RUQ pain 07/05/2022   S/P radiation therapy 11/12/12 - 12/05/12   right Breast   Sciatica 04/30/2015   Sepsis 2014   from UTI    Sjogren's disease 12/12/2021   Splenic lesion 09/02/2012   Sprain of lateral ligament of ankle joint 07/31/2023   Stage 3 chronic kidney disease    Statin intolerance 02/29/2020   Stricture and stenosis of esophagus 10/19/2011   Tongue lesion 02/05/2023   Tremor 07/05/2022   Urgency of urination    Vertigo 01/22/2023   Vitamin B12 deficiency 03/08/2021   Vocal cord cyst 05/18/2021   Past Surgical History:  Procedure Laterality Date   24 HOUR PH STUDY N/A 04/03/2016   Procedure: 24 HOUR PH STUDY;  Surgeon:  Danette Duos, MD;  Location: [facility];  Service: Gastroenterology;  Laterality: N/A;   ANKLE ARTHROSCOPY WITH RECONSTRUCTION Left 03/12/2024   Procedure: ANKLE ARTHROSCOPY WITH OPEN LATERAL ANKLE LIGAMENT STABILIZATION;  Surgeon: Ali Ink, MD;  Location: [facility];  Service: Orthopedics;  Laterality: Left;   BREAST BIOPSY Right 08/23/2012   ADH   BREAST BIOPSY Right 10/11/2012   Ductal Carcinoma   BREAST EXCISIONAL BIOPSY Right 09/04/2013   benign   BREAST LUMPECTOMY Right 10/11/2012   W/ SLN BX   CARDIAC CATHETERIZATION  09-13-2007  DR [name] Null   WELL-PRESERVED LVF/  DIFFUSE SCATTERED CORONARY CALCIFACATION AND ATHEROSCLEROSIS WITHOUT OBSTRUCTION   CARDIAC CATHETERIZATION  08-04-2010  DR MCALHANY   NON-OBSTRUCTIVE CAD/  pLAD 40%/  oLAD 30%/  mLAD 30%/  pRCA 30%/  EF 60%   CARDIOVASCULAR STRESS TEST  06-18-2012  DR McALHANY   LOW RISK NUCLEAR STUDY/  SMALL FIXED AREA OF MODERATELY DECREASED UPTAKE IN ANTEROSEPTAL WALL WHICH MAY BE ARTIFACTUAL/  NO ISCHEMIA/  EF 68%   COLONOSCOPY  09/29/2010   CYSTOSCOPY     CYSTOSCOPY WITH HYDRODISTENSION AND BIOPSY N/A 03/06/2014   Procedure: CYSTOSCOPY/HYDRODISTENSION/ INSTILATION OF MARCAINE  AND PYRIDIUM ;  Surgeon: Edmund Gouge, MD;  Location: [facility];  Service: Urology;  Laterality: N/A;   CYSTOSCOPY/URETEROSCOPY/HOLMIUM LASER/STENT PLACEMENT Left 03/21/2023   Procedure: CYSTOSCOPY LEFT RETROGRADE PYELOGRAM, LEFT URETEROSCOPY, HOLMIUM LASER LITHOTRIPSYM, AND LEFT URETERAL STENT PLACEMENT;  Surgeon: Adelbert Homans, MD;  Location: [facility];  Service: Urology;  Laterality: Left;  60 MINUTES   ESOPHAGEAL MANOMETRY N/A 04/03/2016   Procedure: ESOPHAGEAL MANOMETRY (EM);  Surgeon: Danette Duos, MD;  Location: [facility];  Service: Gastroenterology;  Laterality: N/A;   EXTRACORPOREAL SHOCK WAVE LITHOTRIPSY Left 02/06/2019   Procedure: EXTRACORPOREAL SHOCK WAVE LITHOTRIPSY  (ESWL);  Surgeon: Ottelin, Mark, MD;  Location: [facility];  Service: Urology;  Laterality: Left;   NASAL SINUS SURGERY  1985   ORIF RIGHT ANKLE  FX  2006   POLYPECTOMY     REMOVAL VOCAL CORD CYST  01/2013   REPAIR OF PERONEUS BREVIS TENDON Left 03/12/2024   Procedure: REPAIR OF PERONEUS TENDON;  Surgeon: Ali Ink, MD;  Location: [facility];  Service: Orthopedics;  Laterality: Left;   RIGHT BREAST BX  08/23/2012   RIGHT HAND SURGERY  X3  LAST ONE 2009   INCLUDES  ORIF RIGHT 5TH FINGER AND REVISION TWICE   SKIN CANCER EXCISION     face,arms   TONSILLECTOMY AND ADENOIDECTOMY  AGE 19   TOTAL ABDOMINAL HYSTERECTOMY W/ BILATERAL SALPINGOOPHORECTOMY  1982   W/  APPENDECTOMY   TRANSTHORACIC ECHOCARDIOGRAM  06/24/2012   GRADE I  DIASTOLIC DYSFUNCTION/  EF 55-60%/  MILD MR   UPPER GASTROINTESTINAL ENDOSCOPY     Patient Active Problem List   Diagnosis Date Noted   Otalgia of both ears 04/23/2024   Tinnitus of both ears 04/23/2024   Hypokalemia 12/10/2023   IDA (iron deficiency anemia) 09/12/2023   Stage 3 chronic kidney disease    Right ankle pain 07/31/2023   Common bile duct dilatation 02/13/2023   Oral herpes 02/05/2023   Elevated alkaline phosphatase level 01/25/2023   Vertigo 01/22/2023   Intertrigo 12/13/2022   Hyperkalemia 09/28/2022   Anemia 09/28/2022   Tremor 07/05/2022   RUQ pain 07/05/2022   Leukocytosis 07/05/2022   Sjogren's disease 12/12/2021   Vocal cord cyst 05/18/2021   High serum vitamin D  05/18/2021   Dysuria 03/09/2021   Vitamin B12 deficiency 03/08/2021   Hyperglycemia 03/08/2021   OSA and COPD overlap syndrome 06/10/2020   Recurrent falls 05/02/2020   Statin intolerance 02/29/2020   RLS (restless legs syndrome) 11/09/2019   Recurrent sinusitis 02/25/2019   History of colonic polyp 02/25/2019   DDD (degenerative disc disease), cervical 09/17/2018   Degenerative scoliosis 04/22/2018   Primary insomnia 06/25/2017   Chronic interstitial  cystitis 02/01/2017   Deviated nasal septum 05/12/2016   Hypertrophy of nasal turbinates 05/12/2016   Dysphagia    Allergic rhinitis 01/25/2016   Dyspnea    Sciatica 04/30/2015   Bilateral carpal tunnel syndrome 03/04/2015   Hyperlipidemia 03/01/2015   Pulmonary nodules 02/12/2015   External hemorrhoids 02/05/2015   Chronic night sweats 01/08/2015   Peripheral neuropathy 05/22/2014   Headache 10/13/2013   Arthralgia 08/18/2013   ANA positive 07/29/2013   Depression with anxiety 02/05/2013   Malignant neoplasm of lower-outer quadrant of right breast of female, estrogen receptor positive 01/03/2013   Asthma, allergic 08/05/2012   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   GERD (gastroesophageal reflux disease) 06/03/2012   Stricture and stenosis of esophagus 10/19/2011   Osteoporosis 04/17/2011   Fibrocystic breast disease 10/18/2010   Essential hypertension 08/25/2010   CAD in native artery 08/25/2010   Generalized anxiety disorder 08/03/2010   Bronchitis 03/10/2010   Fibromyalgia 01/11/2010    PCP: Neda Balk, MD   REFERRING PROVIDER: Ali Ink, MD   REFERRING DIAG: [id number] (ICD-10-CM) - Peroneal tendinitis, left leg   THERAPY DIAG:  Pain in left ankle and joints of left foot  Muscle weakness (generalized)  Other abnormalities of gait and mobility  Localized edema  Rationale for Evaluation and Treatment: Rehabilitation  ONSET DATE: 03/12/24  SUBJECTIVE:   SUBJECTIVE STATEMENT: "It hurts right now." Patient reports she stays busy in the house and does not rest much throughout the day.   PERTINENT HISTORY: S/p Lt ankle arthroscopy Brostrom stabilization, peroneus brevis and longus debridement and tenosynovectomy 03/12/24 See extensive PMH above  PAIN:  Are you having pain? Yes: NPRS scale: 6 Pain location: Lt lateral ankle Pain description: throbbing, sharp Aggravating factors: palpation, rubbing it on the sheet (wakes every hour), walking Relieving  factors: pain medication  PRECAUTIONS: Fall,progress per protocol   RED FLAGS: None   WEIGHT BEARING RESTRICTIONS: Yes LLE WBAT In ASO brace   FALLS:  Has patient fallen in last 6 months? Yes. Number of falls 2 (fell stepping up grassy hill, fell in her kitchen from turning quickly)  LIVING ENVIRONMENT: Lives with: lives with their spouse Lives in: House/apartment Stairs: Yes: Internal: 16 steps; on left going up Has following equipment at home: Single point cane  OCCUPATION: retired  PLOF: Independent  PATIENT GOALS: "I want to be able to walk again."  NEXT MD VISIT: End of June 2025   OBJECTIVE:  Note: Objective measures were completed at Evaluation unless otherwise noted.  DIAGNOSTIC FINDINGS: none on file   PATIENT SURVEYS:  FADI: 46/104; 44%  COGNITION: Overall cognitive status: Within functional limits for tasks assessed     SENSATION: See palpation  EDEMA:  Mild swelling about Lt ankle    PALPATION: Hypersensitivity to palpation about distal fibula, lateral malleolus   LOWER EXTREMITY ROM:  Active ROM Right eval Left eval 05/05/24 Left   Hip flexion     Hip extension     Hip abduction     Hip adduction     Hip internal rotation     Hip external rotation     Knee flexion     Knee extension     Ankle dorsiflexion  6 9  Ankle plantarflexion  50   Ankle inversion  15   Ankle eversion  8    (Blank rows = not tested)  LOWER EXTREMITY MMT:  MMT Right eval Left eval  Hip flexion    Hip extension    Hip abduction    Hip adduction    Hip internal rotation    Hip external rotation    Knee flexion    Knee extension    Ankle dorsiflexion  3+  Ankle plantarflexion  3+ sitting  Ankle inversion  3+  Ankle eversion  3+   (Blank rows = not tested)    FUNCTIONAL TESTS:  TUG: 13.5 seconds Romberg x 30 seconds Semi-tandem unable   GAIT: Distance walked: 20 ft  Assistive device utilized: Single point cane. Ankle brace Level of  assistance: Modified independence Comments: limited push-off LLE, foot ER,   [facility] Adult PT Treatment:                                                DATE: 05/05/24  Neuromuscular re-ed: Ankle inversion isometric 2 x 10  Ankle eversion isometric 2 x 10  Resisted ankle DF red band 2 x 10   Self Care: Energy conservation  Do not sleep in brace  Continue with ice Frequent bouts of short duration walking daily   [facility] Adult PT Treatment:                                                DATE: 05/01/24 Therapeutic Exercise: Seated calf raise 2 x 10  Seated toe raise 2 x 10  Calf stretch with towel x 30 sec  Ankle circles x 10 each CW/CCW  Neuromuscular re-ed: Isometric ankle inversion 2 x 10; 5 sec hold  Isometric ankle eversion 2 x 10; 5 sec hold  Resisted ankle plantarflexion red band 2 x 10  Therapeutic Activity: Adjusted SPC to appropriate height and walked 2 laps in clinic    [facility] Adult PT Treatment:                                                DATE: 04/28/24 Therapeutic Exercise: Demonstrated,performed, and issued  initial HEP.   Self Care: Ice for pain/swelling  Densensitization techniques    PATIENT EDUCATION:  Education details:see treatment Person educated: Patient Education method: Explanation, Demonstration, Tactile cues, Verbal cues, and Handouts Education comprehension: verbalized understanding, returned demonstration, verbal cues required, tactile cues required, and needs further education  HOME EXERCISE PROGRAM: Access Code: [email or web address] URL: [email or web address] Date: 05/05/2024 Prepared by: Forrestine Ike  Exercises - Long Sitting Calf Stretch with Strap  - 2 x daily - 7 x weekly - 3 sets - 30s ec  hold - Seated Toe Raise  - 2 x daily - 7 x weekly - 2 sets - 10 reps - Seated Heel Raise  - 2 x daily - 7 x weekly - 2 sets - 10 reps - Seated Ankle Circles  - 2 x daily - 7 x weekly - 2 sets - 10 reps - Isometric Ankle Inversion  - 2 x daily - 7 x  weekly - 2 sets - 10 reps - 5 sec  hold  Patient Education - Hydrographic surveyor - Energy Conservation 5 P's  ASSESSMENT:  CLINICAL IMPRESSION: Patient arrives with moderate Lt ankle. We had lengthy discussion regarding active rest as she is overdoing it with continuous standing/walking activity with household activities that is likely exacerbating her pain. Handouts were provided regarding energy conservation as it relates to allowing for adequate rest/healing of the Lt ankle. No pain with eversion isometrics today and introduced resisted ankle dorsiflexion without pain.   EVAL: Patient is a 78 y.o. female who was seen today for physical therapy evaluation and treatment for s/p Lt ankle arthroscopy Brostrom stabilization, peroneus brevis and longus debridement and tenosynovectomy on 03/12/24. She demonstrates ROM, strength, gait and balance deficits that are consistent with her recent post-operative status. She will benefit from skilled PT to address the above stated deficits in order to return to optimal function.   OBJECTIVE IMPAIRMENTS: Abnormal gait, decreased activity tolerance, decreased balance, decreased endurance, decreased knowledge of condition, decreased mobility, difficulty walking, decreased ROM, decreased strength, increased edema, impaired flexibility, impaired sensation, improper body mechanics, postural dysfunction, and pain.   ACTIVITY LIMITATIONS: carrying, lifting, bending, standing, squatting, sleeping, stairs, transfers, and locomotion level  PARTICIPATION LIMITATIONS: meal prep, cleaning, laundry, shopping, and community activity  PERSONAL FACTORS: Age, Fitness, Time since onset of injury/illness/exacerbation, and 3+ comorbidities: see PMH above are also affecting patient's functional outcome.   REHAB POTENTIAL: Good  CLINICAL DECISION MAKING: Evolving/moderate complexity  EVALUATION COMPLEXITY: Moderate   GOALS: Goals reviewed with  patient? Yes  SHORT TERM GOALS: Target date: 06/09/2024   Patient will be independent and compliant with initial HEP.   Baseline: issued at eval  Goal status: INITIAL  2.  Patient will demonstrate at least 60 degrees of Lt ankle plantarflexion AROM to improve push-off during gait cycle.  Baseline: see above Goal status: INITIAL  3.  Patient will demonstrate at least 10 degrees of Lt ankle dorsiflexion AROM to improve gait mechanics.  Baseline: see above Goal status: INITIAL  4.  Patient will complete TUG in </= 12 seconds to reduce fall risk.  Baseline: see above Goal status: INITIAL    LONG TERM GOALS: Target date: 07/26/24  Patient will score >/= 60/104 on the Foot and Ankle Disability Index (MDC 7) to signify clinically meaningful improvement in functional abilities.   Baseline: see above Goal status: INITIAL  2.  Patient will maintain tandem stance for at least 10 seconds to improve gait stability.  Baseline: see above Goal  status: INITIAL  3.  Patient will be modified independent with curb/stair negotiation.  Baseline: uses cane and requires support from spouse  Goal status: INITIAL  4.  Patient will self-report ability to tolerate at least 30 minutes of standing/walking activity.  Baseline: 10 minutes  Goal status: INITIAL  5.  Patient will demonstrate at least 4/5 Lt ankle strength to improve stability when navigating on uneven terrain.  Baseline: see above  Goal status: INITIAL  6.  Patient will be independent with advanced home program to progress/maintain current level of function.  Baseline: initial HEP issued  Goal status: INITIAL   PLAN:  PT FREQUENCY: 2x/week  PT DURATION: 12 weeks  PLANNED INTERVENTIONS: 97164- PT Re-evaluation, 97750- Physical Performance Testing, 97110-Therapeutic exercises, 97530- Therapeutic activity, [id number]- Neuromuscular re-education, 97535- Self Care, 16109- Manual therapy, [id number]- Gait training, [id number]- Aquatic Therapy,  97016- Vasopneumatic device, Taping, Dry Needling, Cryotherapy, and Moist heat  PLAN FOR NEXT SESSION: review and progress HEP; ankle AROM, introduce ankle isometrics, static balance activity. Utilize protocol on file    Tristyn Demarest, PT, DPT, ATC 05/05/24 1:58 PM

## 2024-05-06 DIAGNOSIS — N3020 Other chronic cystitis without hematuria: Secondary | ICD-10-CM | POA: Diagnosis not present

## 2024-05-07 ENCOUNTER — Encounter: Payer: Self-pay | Admitting: *Deleted

## 2024-05-07 ENCOUNTER — Ambulatory Visit

## 2024-05-07 DIAGNOSIS — R600 Localized edema: Secondary | ICD-10-CM

## 2024-05-07 DIAGNOSIS — M25572 Pain in left ankle and joints of left foot: Secondary | ICD-10-CM

## 2024-05-07 DIAGNOSIS — R2689 Other abnormalities of gait and mobility: Secondary | ICD-10-CM

## 2024-05-07 DIAGNOSIS — M6281 Muscle weakness (generalized): Secondary | ICD-10-CM

## 2024-05-07 NOTE — Therapy (Signed)
 OUTPATIENT PHYSICAL THERAPY LOWER EXTREMITY TREATMENT   Patient Name: [name] MRN: [id number] DOB:18-Jun-1946, 78 y.o., female Today's Date: 05/07/2024  END OF SESSION:  PT End of Session - 05/07/24 1153     Visit Number 4    Number of Visits 25    Date for PT Re-Evaluation 07/26/24    Authorization Type [employer]    Progress Note Due on Visit 10    PT Start Time 1149    PT Stop Time 1229    PT Time Calculation (min) 40 min    Activity Tolerance Patient tolerated treatment well    Behavior During Therapy [facility] for tasks assessed/performed                Past Medical History:  Diagnosis Date   Abdominal pain 10/26/2013   Allergic rhinitis 01/25/2016   ANA positive 07/29/2013   Anemia    Arthralgia 08/18/2013   Arthritis    Asthma, allergic 08/05/2012   Atrial flutter    past history- not current   Atypical chest pain 01/01/2015   Bilateral carpal tunnel syndrome 03/04/2015   CAD in native artery 08/25/2010   Minor CAD 2009, 2011, low risk Myoview  2013 and 2016        Cataract    bilaterally removed    Cellulitis of breast 02/05/2023   Chronic constipation    Chronic cough 01/09/2017   Chronic interstitial cystitis 02/01/2017   Chronic night sweats 01/08/2015   Common bile duct dilatation 02/13/2023   Concussion    x 3   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   DDD (degenerative disc disease), cervical 09/17/2018   Degenerative scoliosis 04/22/2018   Deviated septum 05/12/2016   Dysphagia    Dyspnea    Dysuria 03/09/2021   Elevated alkaline phosphatase level 01/25/2023   Essential hypertension 08/25/2010   External hemorrhoids 02/05/2015   Facial trauma 01/22/2023   Family history of adverse reaction to anesthesia    Family history of malignant hyperthermia    father had this   Fibrocystic breast disease 10/18/2010   Fibromyalgia    Frequency of urination    Generalized anxiety disorder 08/03/2010   GERD (gastroesophageal reflux disease)     Headache 10/13/2013   High serum vitamin D  05/18/2021   History of chronic bronchitis    History of colonic polyp 02/25/2019   History of hiatal hernia    History of tachycardia    CONTROLLED  WITH ATENOLOL    Hyperglycemia 03/08/2021   Hyperkalemia 09/28/2022   Hyperlipidemia    Intertrigo 12/13/2022   Kidney stone 02/26/2019   Knee pain, right 04/30/2015   Left hip pain 04/30/2015   Leg pain 02/24/2011   Qualifier: Diagnosis of   By: Jalene Mayor DOAdel Holt         Leukocytosis 07/05/2022   Local reaction to hymenoptera sting 05/09/2023   Low back pain 09/08/2020   Major depressive disorder 02/05/2013   Malignant neoplasm of lower-outer quadrant of right breast of female, estrogen receptor positive 01/03/2013   Nasal turbinate hypertrophy 05/12/2016   Oral herpes 02/05/2023   OSA and COPD overlap syndrome 06/10/2020   Osteoporosis 01/2019   T score -2.2 stable/improved from prior study   Pelvic pain    Peripheral neuropathy 05/22/2014   Personal history of radiation therapy    Pneumonia    Primary insomnia 06/25/2017   Pulmonary nodules 02/12/2015   Rash and other nonspecific skin eruption 05/09/2023   Recurrent falls 05/02/2020   Recurrent sinusitis  02/25/2019   Right arm pain 07/20/2016   RLS (restless legs syndrome) 11/09/2019   RUQ pain 07/05/2022   S/P radiation therapy 11/12/12 - 12/05/12   right Breast   Sciatica 04/30/2015   Sepsis 2014   from UTI    Sjogren's disease 12/12/2021   Splenic lesion 09/02/2012   Sprain of lateral ligament of ankle joint 07/31/2023   Stage 3 chronic kidney disease    Statin intolerance 02/29/2020   Stricture and stenosis of esophagus 10/19/2011   Tongue lesion 02/05/2023   Tremor 07/05/2022   Urgency of urination    Vertigo 01/22/2023   Vitamin B12 deficiency 03/08/2021   Vocal cord cyst 05/18/2021   Past Surgical History:  Procedure Laterality Date   24 HOUR PH STUDY N/A 04/03/2016   Procedure: 24 HOUR PH STUDY;  Surgeon:  Danette Duos, MD;  Location: [facility];  Service: Gastroenterology;  Laterality: N/A;   ANKLE ARTHROSCOPY WITH RECONSTRUCTION Left 03/12/2024   Procedure: ANKLE ARTHROSCOPY WITH OPEN LATERAL ANKLE LIGAMENT STABILIZATION;  Surgeon: Ali Ink, MD;  Location: [facility];  Service: Orthopedics;  Laterality: Left;   BREAST BIOPSY Right 08/23/2012   ADH   BREAST BIOPSY Right 10/11/2012   Ductal Carcinoma   BREAST EXCISIONAL BIOPSY Right 09/04/2013   benign   BREAST LUMPECTOMY Right 10/11/2012   W/ SLN BX   CARDIAC CATHETERIZATION  09-13-2007  DR [name] Null   WELL-PRESERVED LVF/  DIFFUSE SCATTERED CORONARY CALCIFACATION AND ATHEROSCLEROSIS WITHOUT OBSTRUCTION   CARDIAC CATHETERIZATION  08-04-2010  DR MCALHANY   NON-OBSTRUCTIVE CAD/  pLAD 40%/  oLAD 30%/  mLAD 30%/  pRCA 30%/  EF 60%   CARDIOVASCULAR STRESS TEST  06-18-2012  DR McALHANY   LOW RISK NUCLEAR STUDY/  SMALL FIXED AREA OF MODERATELY DECREASED UPTAKE IN ANTEROSEPTAL WALL WHICH MAY BE ARTIFACTUAL/  NO ISCHEMIA/  EF 68%   COLONOSCOPY  09/29/2010   CYSTOSCOPY     CYSTOSCOPY WITH HYDRODISTENSION AND BIOPSY N/A 03/06/2014   Procedure: CYSTOSCOPY/HYDRODISTENSION/ INSTILATION OF MARCAINE  AND PYRIDIUM ;  Surgeon: Edmund Gouge, MD;  Location: [facility];  Service: Urology;  Laterality: N/A;   CYSTOSCOPY/URETEROSCOPY/HOLMIUM LASER/STENT PLACEMENT Left 03/21/2023   Procedure: CYSTOSCOPY LEFT RETROGRADE PYELOGRAM, LEFT URETEROSCOPY, HOLMIUM LASER LITHOTRIPSYM, AND LEFT URETERAL STENT PLACEMENT;  Surgeon: Adelbert Homans, MD;  Location: [facility];  Service: Urology;  Laterality: Left;  60 MINUTES   ESOPHAGEAL MANOMETRY N/A 04/03/2016   Procedure: ESOPHAGEAL MANOMETRY (EM);  Surgeon: Danette Duos, MD;  Location: [facility];  Service: Gastroenterology;  Laterality: N/A;   EXTRACORPOREAL SHOCK WAVE LITHOTRIPSY Left 02/06/2019   Procedure: EXTRACORPOREAL SHOCK WAVE LITHOTRIPSY  (ESWL);  Surgeon: Ottelin, Mark, MD;  Location: [facility];  Service: Urology;  Laterality: Left;   NASAL SINUS SURGERY  1985   ORIF RIGHT ANKLE  FX  2006   POLYPECTOMY     REMOVAL VOCAL CORD CYST  01/2013   REPAIR OF PERONEUS BREVIS TENDON Left 03/12/2024   Procedure: REPAIR OF PERONEUS TENDON;  Surgeon: Ali Ink, MD;  Location: [facility];  Service: Orthopedics;  Laterality: Left;   RIGHT BREAST BX  08/23/2012   RIGHT HAND SURGERY  X3  LAST ONE 2009   INCLUDES  ORIF RIGHT 5TH FINGER AND REVISION TWICE   SKIN CANCER EXCISION     face,arms   TONSILLECTOMY AND ADENOIDECTOMY  AGE 27   TOTAL ABDOMINAL HYSTERECTOMY W/ BILATERAL SALPINGOOPHORECTOMY  1982   W/  APPENDECTOMY   TRANSTHORACIC ECHOCARDIOGRAM  06/24/2012  GRADE I DIASTOLIC DYSFUNCTION/  EF 55-60%/  MILD MR   UPPER GASTROINTESTINAL ENDOSCOPY     Patient Active Problem List   Diagnosis Date Noted   Otalgia of both ears 04/23/2024   Tinnitus of both ears 04/23/2024   Hypokalemia 12/10/2023   IDA (iron deficiency anemia) 09/12/2023   Stage 3 chronic kidney disease    Right ankle pain 07/31/2023   Common bile duct dilatation 02/13/2023   Oral herpes 02/05/2023   Elevated alkaline phosphatase level 01/25/2023   Vertigo 01/22/2023   Intertrigo 12/13/2022   Hyperkalemia 09/28/2022   Anemia 09/28/2022   Tremor 07/05/2022   RUQ pain 07/05/2022   Leukocytosis 07/05/2022   Sjogren's disease 12/12/2021   Vocal cord cyst 05/18/2021   High serum vitamin D  05/18/2021   Dysuria 03/09/2021   Vitamin B12 deficiency 03/08/2021   Hyperglycemia 03/08/2021   OSA and COPD overlap syndrome 06/10/2020   Recurrent falls 05/02/2020   Statin intolerance 02/29/2020   RLS (restless legs syndrome) 11/09/2019   Recurrent sinusitis 02/25/2019   History of colonic polyp 02/25/2019   DDD (degenerative disc disease), cervical 09/17/2018   Degenerative scoliosis 04/22/2018   Primary insomnia 06/25/2017   Chronic interstitial  cystitis 02/01/2017   Deviated nasal septum 05/12/2016   Hypertrophy of nasal turbinates 05/12/2016   Dysphagia    Allergic rhinitis 01/25/2016   Dyspnea    Sciatica 04/30/2015   Bilateral carpal tunnel syndrome 03/04/2015   Hyperlipidemia 03/01/2015   Pulmonary nodules 02/12/2015   External hemorrhoids 02/05/2015   Chronic night sweats 01/08/2015   Peripheral neuropathy 05/22/2014   Headache 10/13/2013   Arthralgia 08/18/2013   ANA positive 07/29/2013   Depression with anxiety 02/05/2013   Malignant neoplasm of lower-outer quadrant of right breast of female, estrogen receptor positive 01/03/2013   Asthma, allergic 08/05/2012   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   GERD (gastroesophageal reflux disease) 06/03/2012   Stricture and stenosis of esophagus 10/19/2011   Osteoporosis 04/17/2011   Fibrocystic breast disease 10/18/2010   Essential hypertension 08/25/2010   CAD in native artery 08/25/2010   Generalized anxiety disorder 08/03/2010   Bronchitis 03/10/2010   Fibromyalgia 01/11/2010    PCP: Neda Balk, MD   REFERRING PROVIDER: Ali Ink, MD   REFERRING DIAG: [phone] (ICD-10-CM) - Peroneal tendinitis, left leg   THERAPY DIAG:  Pain in left ankle and joints of left foot  Muscle weakness (generalized)  Other abnormalities of gait and mobility  Localized edema  Rationale for Evaluation and Treatment: Rehabilitation  ONSET DATE: 03/12/24  SUBJECTIVE:   SUBJECTIVE STATEMENT: Patient reports she has been able to sleep for the past two nights. Has been icing and resting more frequently as needed.   PERTINENT HISTORY: S/p Lt ankle arthroscopy Brostrom stabilization, peroneus brevis and longus debridement and tenosynovectomy 03/12/24 See extensive PMH above  PAIN:  Are you having pain? Yes: NPRS scale: 4 Pain location: Lt lateral ankle and arch Pain description: throbbing, sharp Aggravating factors: palpation, rubbing it on the sheet (wakes every hour),  walking Relieving factors: pain medication  PRECAUTIONS: Fall,progress per protocol   RED FLAGS: None   WEIGHT BEARING RESTRICTIONS: Yes LLE WBAT In ASO brace   FALLS:  Has patient fallen in last 6 months? Yes. Number of falls 2 (fell stepping up grassy hill, fell in her kitchen from turning quickly)  LIVING ENVIRONMENT: Lives with: lives with their spouse Lives in: House/apartment Stairs: Yes: Internal: 16 steps; on left going up Has following equipment at home: Single point  cane  OCCUPATION: retired   PLOF: Independent  PATIENT GOALS: "I want to be able to walk again."  NEXT MD VISIT: End of June 2025   OBJECTIVE:  Note: Objective measures were completed at Evaluation unless otherwise noted.  DIAGNOSTIC FINDINGS: none on file   PATIENT SURVEYS:  FADI: 46/104; 44%  COGNITION: Overall cognitive status: Within functional limits for tasks assessed     SENSATION: See palpation  EDEMA:  Mild swelling about Lt ankle    PALPATION: Hypersensitivity to palpation about distal fibula, lateral malleolus   LOWER EXTREMITY ROM:  Active ROM Right eval Left eval 05/05/24 Left   Hip flexion     Hip extension     Hip abduction     Hip adduction     Hip internal rotation     Hip external rotation     Knee flexion     Knee extension     Ankle dorsiflexion  6 9  Ankle plantarflexion  50   Ankle inversion  15   Ankle eversion  8    (Blank rows = not tested)  LOWER EXTREMITY MMT:  MMT Right eval Left eval  Hip flexion    Hip extension    Hip abduction    Hip adduction    Hip internal rotation    Hip external rotation    Knee flexion    Knee extension    Ankle dorsiflexion  3+  Ankle plantarflexion  3+ sitting  Ankle inversion  3+  Ankle eversion  3+   (Blank rows = not tested)    FUNCTIONAL TESTS:  TUG: 13.5 seconds Romberg x 30 seconds Semi-tandem unable   GAIT: Distance walked: 20 ft  Assistive device utilized: Single point cane. Ankle  brace Level of assistance: Modified independence Comments: limited push-off LLE, foot ER,   [facility] Adult PT Treatment:                                                DATE: 05/07/24 Therapeutic Exercise: Sidelying ankle eversion x 10  Reviewed and updated HEP  Manual Therapy: Demo and returned demo of use of tennis ball for plantar fascia release  Neuromuscular re-ed: Seated great toe extension 2 x 10  Seated ankle rockerboard 2 x 10  Resisted ankle PF yellow band d/c due to pain Seated calf raise 5 lbs 2 x 10   Self Care: Only wear brace for ambulation   [facility] Adult PT Treatment:                                                DATE: 05/05/24  Neuromuscular re-ed: Ankle inversion isometric 2 x 10  Ankle eversion isometric 2 x 10  Resisted ankle DF red band 2 x 10   Self Care: Energy conservation  Do not sleep in brace  Continue with ice Frequent bouts of short duration walking daily   [facility] Adult PT Treatment:                                                DATE: 05/01/24 Therapeutic Exercise: Seated  calf raise 2 x 10  Seated toe raise 2 x 10  Calf stretch with towel x 30 sec  Ankle circles x 10 each CW/CCW  Neuromuscular re-ed: Isometric ankle inversion 2 x 10; 5 sec hold  Isometric ankle eversion 2 x 10; 5 sec hold  Resisted ankle plantarflexion red band 2 x 10  Therapeutic Activity: Adjusted SPC to appropriate height and walked 2 laps in clinic     PATIENT EDUCATION:  Education details:HEP update  Person educated: Patient Education method: Explanation, Demonstration, Tactile cues, Verbal cues, and Handouts Education comprehension: verbalized understanding, returned demonstration, verbal cues required, tactile cues required, and needs further education  HOME EXERCISE PROGRAM: Access Code: [email or web address] URL: [email or web address] Date: 05/05/2024 Prepared by: Forrestine Ike  Exercises - Long Sitting Calf Stretch with Strap  - 2 x daily - 7 x weekly - 3 sets  - 30s ec  hold - Seated Toe Raise  - 2 x daily - 7 x weekly - 2 sets - 10 reps - Seated Heel Raise  - 2 x daily - 7 x weekly - 2 sets - 10 reps - Seated Ankle Circles  - 2 x daily - 7 x weekly - 2 sets - 10 reps - Isometric Ankle Inversion  - 2 x daily - 7 x weekly - 2 sets - 10 reps - 5 sec  hold  Patient Education - Hydrographic surveyor - Energy Conservation 5 P's  ASSESSMENT:  CLINICAL IMPRESSION: Patient arrives mild ankle pain and arch pain. She has significant tautness and palpable tenderness about plantar fascia. Was educated on performing self-soft tissue mobilization as part of HEP. Progressed ankle mobility and strengthening with fairly good tolerance with exception of light resisted plantarflexion as this caused pain about the lateral ankle and was discontinued. She reported improvement in arch pain at conclusion of session.   EVAL: Patient is a 78 y.o. female who was seen today for physical therapy evaluation and treatment for s/p Lt ankle arthroscopy Brostrom stabilization, peroneus brevis and longus debridement and tenosynovectomy on 03/12/24. She demonstrates ROM, strength, gait and balance deficits that are consistent with her recent post-operative status. She will benefit from skilled PT to address the above stated deficits in order to return to optimal function.   OBJECTIVE IMPAIRMENTS: Abnormal gait, decreased activity tolerance, decreased balance, decreased endurance, decreased knowledge of condition, decreased mobility, difficulty walking, decreased ROM, decreased strength, increased edema, impaired flexibility, impaired sensation, improper body mechanics, postural dysfunction, and pain.   ACTIVITY LIMITATIONS: carrying, lifting, bending, standing, squatting, sleeping, stairs, transfers, and locomotion level  PARTICIPATION LIMITATIONS: meal prep, cleaning, laundry, shopping, and community activity  PERSONAL FACTORS: Age, Fitness, Time since  onset of injury/illness/exacerbation, and 3+ comorbidities: see PMH above are also affecting patient's functional outcome.   REHAB POTENTIAL: Good  CLINICAL DECISION MAKING: Evolving/moderate complexity  EVALUATION COMPLEXITY: Moderate   GOALS: Goals reviewed with patient? Yes  SHORT TERM GOALS: Target date: 06/09/2024   Patient will be independent and compliant with initial HEP.   Baseline: issued at eval  Goal status: INITIAL  2.  Patient will demonstrate at least 60 degrees of Lt ankle plantarflexion AROM to improve push-off during gait cycle.  Baseline: see above Goal status: INITIAL  3.  Patient will demonstrate at least 10 degrees of Lt ankle dorsiflexion AROM to improve gait mechanics.  Baseline: see above Goal status: INITIAL  4.  Patient will complete TUG in </= 12 seconds to reduce fall risk.  Baseline: see above  Goal status: INITIAL    LONG TERM GOALS: Target date: 07/26/24  Patient will score >/= 60/104 on the Foot and Ankle Disability Index (MDC 7) to signify clinically meaningful improvement in functional abilities.   Baseline: see above Goal status: INITIAL  2.  Patient will maintain tandem stance for at least 10 seconds to improve gait stability.  Baseline: see above Goal status: INITIAL  3.  Patient will be modified independent with curb/stair negotiation.  Baseline: uses cane and requires support from spouse  Goal status: INITIAL  4.  Patient will self-report ability to tolerate at least 30 minutes of standing/walking activity.  Baseline: 10 minutes  Goal status: INITIAL  5.  Patient will demonstrate at least 4/5 Lt ankle strength to improve stability when navigating on uneven terrain.  Baseline: see above  Goal status: INITIAL  6.  Patient will be independent with advanced home program to progress/maintain current level of function.  Baseline: initial HEP issued  Goal status: INITIAL   PLAN:  PT FREQUENCY: 2x/week  PT DURATION: 12  weeks  PLANNED INTERVENTIONS: 97164- PT Re-evaluation, 97750- Physical Performance Testing, 97110-Therapeutic exercises, 97530- Therapeutic activity, [id number]- Neuromuscular re-education, 97535- Self Care, 60454- Manual therapy, [id number]- Gait training, [id number]- Aquatic Therapy, 97016- Vasopneumatic device, Taping, Dry Needling, Cryotherapy, and Moist heat  PLAN FOR NEXT SESSION: review and progress HEP; ankle AROM, introduce ankle isometrics, static balance activity. Utilize protocol on file    Tyniesha Howald, PT, DPT, ATC 05/07/24 12:35 PM

## 2024-05-08 ENCOUNTER — Encounter: Payer: Self-pay | Admitting: Professional

## 2024-05-08 ENCOUNTER — Ambulatory Visit (INDEPENDENT_AMBULATORY_CARE_PROVIDER_SITE_OTHER): Admitting: Professional

## 2024-05-08 DIAGNOSIS — F331 Major depressive disorder, recurrent, moderate: Secondary | ICD-10-CM

## 2024-05-08 DIAGNOSIS — F411 Generalized anxiety disorder: Secondary | ICD-10-CM

## 2024-05-08 DIAGNOSIS — F4310 Post-traumatic stress disorder, unspecified: Secondary | ICD-10-CM

## 2024-05-08 NOTE — Progress Notes (Signed)
 [facility] Counselor/Therapist Progress Note  Patient ID: [name], MRN: [id number],    Date: 05/08/2024  Time Spent: 37 minutes 108-145pm   Treatment Type: Individual Therapy  Risk Assessment: Danger to Self:  No Self-injurious Behavior: No Danger to Others: No  Subjective: This session was held via video teletherapy. The patient consented to video teletherapy and was located in her home during this session. She is aware it is the responsibility of the patient to secure confidentiality on her end of the session. The provider was in a private home office for the duration of this session.    The patient arrived on time for the [name] appointment.   Issues addressed: 1-mood a-has struggled with her feelings since our last visit on a few occasions -beat herself up a few times and asked herself why she didn't notice b-she has been listening to TV and realized that so many people have childhood issues c-pt appreciates the simple things like the sun 2-[name] -her daughter was so thankful to hear the good things about her dad -she never had anyone tell her good thinks about him -[name] talks to her in "a whole different tune" -"she is much more loving to me" 3-physical -her ankle is slowing her down -PT was hopeful to be out of brace before her July appointment -pt was reminded  4-husband [name] -has commented that she has a totally different wife -husband asked her to tell me that he is so thankful for the work that we are doing 5-[name] graduation -leaves on Monday with [name] -pt admits she needs to admit that [name] is not her child -she wants her to have a good time with her friends -pt doesn't want to be an overbearing grandmother -[name] was the result of an insemination that occurred with 28 old partner -[name]'s current partner [name] will be at graduation -pt' former partner [name] and her mother will be at the graduation -pt commented on why  her daughter is letting the former partner come to graduation -Clinician asked if she thinks that [place] loves her granddaughter and she said yes -Clinician suggested is that the only reason for her to participate -reminded pt that she was in [name] life from conception through 29 months and [name] knows her smell, her voice, her feel 6-visit with sister [name] -she is coming to visit the patient on Sunday morning at her home 7-objectives related to depression and sleep have greatly improved  Treatment Plan Problems: anxiety, depression, family conflict Goals: Alleviate depressive symptoms Recognize, accept, and cope with depressive feelings Develop healthy thinking patterns Develop healthy interpersonal relationships (particularly younger daughter) Reduce overall frequency, intensity, and duration of anxiety Stabilize anxiety level while increasing ability to function Enhance ability to effectively cope with full variety of stressors Learn and implement coping skills that result in a reduction of anxiety   Objectives target date for all objectives is 09/26/2024: Verbalize an understanding of the cognitive, physiological, and behavioral components of anxiety    60 Learning and implement calming skills to reduce overall anxiety         60  Verbalize an understanding of the role that cognitive biases play in excessive irrational worry and persistent anxiety symptoms 70 Identify, challenge, and replace biased fearful self-talk  60 Learn and implement problem solving strategies   70 Identify and engage in pleasant activities    80 Learning and implement personal and interpersonal skills to reduce anxiety and improve interpersonal relationships   60 Learn to accept limitations  in life and commit to tolerating, rather than avoiding, unpleasant emotions while accomplishing meaningful goal 60 Identify major life conflicts from the past and present that form the basis for present  anxiety      70 Maintain involvement in work, family, and social activities 79 Reestablish a consistent sleep-wake cycle   70 Verbalize an accurate understanding of depression  60 Identify and replace thoughts that support depression  34 Learn and implement behavioral strategies   60 Verbalize an understanding and resolution of current interpersonal problems       13 Learn and implement decision making skills   77 Learn and implement conflict resolution skills to resolve interpersonal problems       60 Verbalize an understanding of healthy and unhealthy emotions verbalize insight into how past relationships may be influence current experiences with depression     60 Use mindfulness and acceptance strategies and increase value based behavior        60 Increase hopeful statements about the future.    60 Interventions: Engage the patient in behavioral activation Use instruction, modeling, and role-playing to build the patient 's general social, communication, and/or conflict resolution skills Use Acceptance and Commitment Therapy to help patient  accept uncomfortable realities in order to accomplish value-consistent goals Reinforce the patient 's insight into the role of her past emotional pain and present anxiety  Support the patient in following through with work, family, and social activities Teach and implement sleep hygiene practices  Teach the patient relaxation skills Assign the patient homework Discuss examples demonstrating that unrealistic worry overestimates the probability of threats and underestimates patient's ability  Assist the patient in analyzing his or her worries Help patient understand that avoidance is reinforcing  Consistent with treatment model, discuss how change in cognitive, behavioral, and interpersonal can help patient alleviate depression CBT Behavioral activation to help the patient explore the relationship, nature of the dispute Help the patient develop new  interpersonal skills and relationships Conduct Problem solving therapy Teach conflict resolution skills Use a process-experiential approach Conduct ACT  Diagnosis:Major depressive disorder, recurrent episode, moderate (HCC)  Generalized anxiety disorder  PTSD (post-traumatic stress disorder)  Plan:  -meet on Wednesday, May 21, 2024 at [employer].

## 2024-05-16 ENCOUNTER — Ambulatory Visit

## 2024-05-19 NOTE — Assessment & Plan Note (Signed)
 hgba1c acceptable, minimize simple carbs. Increase exercise as tolerated.

## 2024-05-19 NOTE — Assessment & Plan Note (Signed)
 Hydrate and monitor

## 2024-05-19 NOTE — Assessment & Plan Note (Signed)
Statin intolerant. Encourage heart healthy diet such as MIND or DASH diet, increase exercise, avoid trans fats, simple carbohydrates and processed foods, consider a krill or fish or flaxseed oil cap daily.

## 2024-05-19 NOTE — Assessment & Plan Note (Signed)
 She is noting increasing SOB and lower O2 sats. This am was 92%, lowest 89%, highest 98%. Comes up with deep breaths and Albuterol ,  she notes temp of 99.9 degrees. Notes upper chest congestion. Notes some sinus congestion, has recently seen ENT and they have referred her to a specialist in TMJ because he thinks is causing her headaches. Continue Albuterol  prn.

## 2024-05-19 NOTE — Assessment & Plan Note (Signed)
 Supplement and monitor

## 2024-05-19 NOTE — Assessment & Plan Note (Signed)
 Doing well on current medications

## 2024-05-19 NOTE — Assessment & Plan Note (Signed)
 Monitor and report, no changes to meds. Encouraged heart healthy diet such as the DASH diet and exercise as tolerated.

## 2024-05-20 ENCOUNTER — Ambulatory Visit

## 2024-05-20 ENCOUNTER — Encounter: Payer: Self-pay | Admitting: Family Medicine

## 2024-05-20 ENCOUNTER — Telehealth (INDEPENDENT_AMBULATORY_CARE_PROVIDER_SITE_OTHER): Payer: Medicare Other | Admitting: Family Medicine

## 2024-05-20 VITALS — BP 138/78 | HR 79 | Ht 64.5 in | Wt 123.8 lb

## 2024-05-20 DIAGNOSIS — E559 Vitamin D deficiency, unspecified: Secondary | ICD-10-CM | POA: Diagnosis not present

## 2024-05-20 DIAGNOSIS — N183 Chronic kidney disease, stage 3 unspecified: Secondary | ICD-10-CM | POA: Diagnosis not present

## 2024-05-20 DIAGNOSIS — J4489 Other specified chronic obstructive pulmonary disease: Secondary | ICD-10-CM | POA: Diagnosis not present

## 2024-05-20 DIAGNOSIS — R739 Hyperglycemia, unspecified: Secondary | ICD-10-CM | POA: Diagnosis not present

## 2024-05-20 DIAGNOSIS — E538 Deficiency of other specified B group vitamins: Secondary | ICD-10-CM | POA: Diagnosis not present

## 2024-05-20 DIAGNOSIS — M25572 Pain in left ankle and joints of left foot: Secondary | ICD-10-CM

## 2024-05-20 DIAGNOSIS — R1310 Dysphagia, unspecified: Secondary | ICD-10-CM

## 2024-05-20 DIAGNOSIS — I10 Essential (primary) hypertension: Secondary | ICD-10-CM

## 2024-05-20 DIAGNOSIS — M6281 Muscle weakness (generalized): Secondary | ICD-10-CM

## 2024-05-20 DIAGNOSIS — R2689 Other abnormalities of gait and mobility: Secondary | ICD-10-CM

## 2024-05-20 DIAGNOSIS — F418 Other specified anxiety disorders: Secondary | ICD-10-CM

## 2024-05-20 DIAGNOSIS — R600 Localized edema: Secondary | ICD-10-CM | POA: Insufficient documentation

## 2024-05-20 DIAGNOSIS — E785 Hyperlipidemia, unspecified: Secondary | ICD-10-CM | POA: Diagnosis not present

## 2024-05-20 NOTE — Assessment & Plan Note (Signed)
 She recently had a long car ride and has struggled with increased edema since then so continue to elevate feet above heart and minimize sodium intake.

## 2024-05-20 NOTE — Progress Notes (Signed)
 MyChart Video Visit    Virtual Visit via Video Note   This patient is at least at moderate risk for complications without adequate follow up. This format is felt to be most appropriate for this patient at this time. Physical exam was limited by quality of the video and audio technology used for the visit. Porsha, CMA was able to get the patient set up on a video visit.  Patient location: Home Patient and provider in visit Provider location: Office  I discussed the limitations of evaluation and management by telemedicine and the availability of in person appointments. The patient expressed understanding and agreed to proceed.  Visit Date: 05/20/2024  Today's healthcare provider: Randie Bustle, MD     Subjective:    Patient ID: [name], female    DOB: [birth year], 78 y.o.   MRN: [id number]  Chief Complaint  Patient presents with   Medical Management of Chronic Issues    Patient presents today for a month follow-up    HPI Discussed the use of AI scribe software for clinical note transcription with the patient, who gave verbal consent to proceed.  History of Present Illness [name] is a 78 year old female who presents with leg swelling and shortness of breath.  She experienced significant leg swelling after a long car trip from [place] to [place] . Despite wearing compression stockings and taking breaks during the drive, her legs became very swollen. She has been bedridden for two days to reduce the swelling and has been elevating her legs on pillows.  She has been experiencing shortness of breath, with oxygen saturation dropping to as low as 89%. She uses her rescue inhaler and regular inhaler more frequently, which provides some relief. She has difficulty breathing when climbing stairs and has been experiencing a low-grade fever, with her temperature reaching 99.29F. No high fevers or thick green sputum.  She has pain in her arms, legs, and feet, which she  attributes to the recent travel and possibly the weather. Her carpal tunnel syndrome is particularly bothersome.  A recent PET scan showed something in her colon, which was not fully examined during a colonoscopy a year and a half ago due to incomplete access.  She has a history of interstitial cystitis, with three recent bouts requiring instillations. Her creatinine levels were last recorded at 0.93, indicating good kidney function. She drinks water  regularly to maintain hydration.  She has been experiencing sinus issues and headaches, which were evaluated by a specialist who suggested TMJ as a possible cause. She reports tinnitus in both ears.    Past Medical History:  Diagnosis Date   Abdominal pain 10/26/2013   Allergic rhinitis 01/25/2016   ANA positive 07/29/2013   Anemia    Arthralgia 08/18/2013   Arthritis    Asthma, allergic 08/05/2012   Atrial flutter    past history- not current   Atypical chest pain 01/01/2015   Bilateral carpal tunnel syndrome 03/04/2015   CAD in native artery 08/25/2010   Minor CAD 2009, 2011, low risk Myoview  2013 and 2016        Cataract    bilaterally removed    Cellulitis of breast 02/05/2023   Chronic constipation    Chronic cough 01/09/2017   Chronic interstitial cystitis 02/01/2017   Chronic night sweats 01/08/2015   Common bile duct dilatation 02/13/2023   Concussion    x 3   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   DDD (degenerative disc disease), cervical 09/17/2018  Degenerative scoliosis 04/22/2018   Deviated septum 05/12/2016   Dysphagia    Dyspnea    Dysuria 03/09/2021   Elevated alkaline phosphatase level 01/25/2023   Essential hypertension 08/25/2010   External hemorrhoids 02/05/2015   Facial trauma 01/22/2023   Family history of adverse reaction to anesthesia    Family history of malignant hyperthermia    father had this   Fibrocystic breast disease 10/18/2010   Fibromyalgia    Frequency of urination    Generalized  anxiety disorder 08/03/2010   GERD (gastroesophageal reflux disease)    Headache 10/13/2013   High serum vitamin D  05/18/2021   History of chronic bronchitis    History of colonic polyp 02/25/2019   History of hiatal hernia    History of tachycardia    CONTROLLED  WITH ATENOLOL    Hyperglycemia 03/08/2021   Hyperkalemia 09/28/2022   Hyperlipidemia    Intertrigo 12/13/2022   Kidney stone 02/26/2019   Knee pain, right 04/30/2015   Left hip pain 04/30/2015   Leg pain 02/24/2011   Qualifier: Diagnosis of   By: Jalene Mayor DOAdel Holt         Leukocytosis 07/05/2022   Local reaction to hymenoptera sting 05/09/2023   Low back pain 09/08/2020   Major depressive disorder 02/05/2013   Malignant neoplasm of lower-outer quadrant of right breast of female, estrogen receptor positive 01/03/2013   Nasal turbinate hypertrophy 05/12/2016   Oral herpes 02/05/2023   OSA and COPD overlap syndrome 06/10/2020   Osteoporosis 01/2019   T score -2.2 stable/improved from prior study   Pelvic pain    Peripheral neuropathy 05/22/2014   Personal history of radiation therapy    Pneumonia    Primary insomnia 06/25/2017   Pulmonary nodules 02/12/2015   Rash and other nonspecific skin eruption 05/09/2023   Recurrent falls 05/02/2020   Recurrent sinusitis 02/25/2019   Right arm pain 07/20/2016   RLS (restless legs syndrome) 11/09/2019   RUQ pain 07/05/2022   S/P radiation therapy 11/12/12 - 12/05/12   right Breast   Sciatica 04/30/2015   Sepsis 2014   from UTI    Sjogren's disease 12/12/2021   Splenic lesion 09/02/2012   Sprain of lateral ligament of ankle joint 07/31/2023   Stage 3 chronic kidney disease    Statin intolerance 02/29/2020   Stricture and stenosis of esophagus 10/19/2011   Tongue lesion 02/05/2023   Tremor 07/05/2022   Urgency of urination    Vertigo 01/22/2023   Vitamin B12 deficiency 03/08/2021   Vocal cord cyst 05/18/2021    Past Surgical History:  Procedure Laterality Date   24  HOUR PH STUDY N/A 04/03/2016   Procedure: 24 HOUR PH STUDY;  Surgeon: Danette Duos, MD;  Location: [facility];  Service: Gastroenterology;  Laterality: N/A;   ANKLE ARTHROSCOPY WITH RECONSTRUCTION Left 03/12/2024   Procedure: ANKLE ARTHROSCOPY WITH OPEN LATERAL ANKLE LIGAMENT STABILIZATION;  Surgeon: Ali Ink, MD;  Location: [facility];  Service: Orthopedics;  Laterality: Left;   BREAST BIOPSY Right 08/23/2012   ADH   BREAST BIOPSY Right 10/11/2012   Ductal Carcinoma   BREAST EXCISIONAL BIOPSY Right 09/04/2013   benign   BREAST LUMPECTOMY Right 10/11/2012   W/ SLN BX   CARDIAC CATHETERIZATION  09-13-2007  DR [name] Null   WELL-PRESERVED LVF/  DIFFUSE SCATTERED CORONARY CALCIFACATION AND ATHEROSCLEROSIS WITHOUT OBSTRUCTION   CARDIAC CATHETERIZATION  08-04-2010  DR Abel Hoe   NON-OBSTRUCTIVE CAD/  pLAD 40%/  oLAD 30%/  mLAD 30%/  pRCA 30%/  EF 60%  CARDIOVASCULAR STRESS TEST  06-18-2012  DR McALHANY   LOW RISK NUCLEAR STUDY/  SMALL FIXED AREA OF MODERATELY DECREASED UPTAKE IN ANTEROSEPTAL WALL WHICH MAY BE ARTIFACTUAL/  NO ISCHEMIA/  EF 68%   COLONOSCOPY  09/29/2010   CYSTOSCOPY     CYSTOSCOPY WITH HYDRODISTENSION AND BIOPSY N/A 03/06/2014   Procedure: CYSTOSCOPY/HYDRODISTENSION/ INSTILATION OF MARCAINE  AND PYRIDIUM ;  Surgeon: Edmund Gouge, MD;  Location: [facility];  Service: Urology;  Laterality: N/A;   CYSTOSCOPY/URETEROSCOPY/HOLMIUM LASER/STENT PLACEMENT Left 03/21/2023   Procedure: CYSTOSCOPY LEFT RETROGRADE PYELOGRAM, LEFT URETEROSCOPY, HOLMIUM LASER LITHOTRIPSYM, AND LEFT URETERAL STENT PLACEMENT;  Surgeon: Adelbert Homans, MD;  Location: [facility];  Service: Urology;  Laterality: Left;  60 MINUTES   ESOPHAGEAL MANOMETRY N/A 04/03/2016   Procedure: ESOPHAGEAL MANOMETRY (EM);  Surgeon: Danette Duos, MD;  Location: [facility];  Service: Gastroenterology;  Laterality: N/A;   EXTRACORPOREAL SHOCK WAVE LITHOTRIPSY  Left 02/06/2019   Procedure: EXTRACORPOREAL SHOCK WAVE LITHOTRIPSY (ESWL);  Surgeon: Ottelin, Mark, MD;  Location: [facility];  Service: Urology;  Laterality: Left;   NASAL SINUS SURGERY  1985   ORIF RIGHT ANKLE  FX  2006   POLYPECTOMY     REMOVAL VOCAL CORD CYST  01/2013   REPAIR OF PERONEUS BREVIS TENDON Left 03/12/2024   Procedure: REPAIR OF PERONEUS TENDON;  Surgeon: Ali Ink, MD;  Location: [facility];  Service: Orthopedics;  Laterality: Left;   RIGHT BREAST BX  08/23/2012   RIGHT HAND SURGERY  X3  LAST ONE 2009   INCLUDES  ORIF RIGHT 5TH FINGER AND REVISION TWICE   SKIN CANCER EXCISION     face,arms   TONSILLECTOMY AND ADENOIDECTOMY  AGE 22   TOTAL ABDOMINAL HYSTERECTOMY W/ BILATERAL SALPINGOOPHORECTOMY  1982   W/  APPENDECTOMY   TRANSTHORACIC ECHOCARDIOGRAM  06/24/2012   GRADE I DIASTOLIC DYSFUNCTION/  EF 55-60%/  MILD MR   UPPER GASTROINTESTINAL ENDOSCOPY      Family History  Problem Relation Age of Onset   Rectal cancer Mother    Colon cancer Mother    Pancreatic cancer Mother    Diabetes Mother    Breast cancer Mother 71   Breast cancer Maternal Aunt        breast   Birth defects Maternal Aunt    Irritable bowel syndrome Son    Heart disease Son        CAD, MV replacement   Allergic Disorder Daughter    Diabetes Daughter    Colon cancer Father    Colon polyps Father    Diabetes Father    Stroke Father    Heart disease Father        CHF   Hyperlipidemia Father    Hypertension Father    Arthritis Father    Breast cancer Paternal Aunt    Breast cancer Paternal Aunt    Arthritis Paternal Uncle    Allergic Disorder Daughter    Heart disease Cousin        CAD, had a blood clot after stenting   Esophageal cancer Neg Hx    Stomach cancer Neg Hx     Social History   Socioeconomic History   Marital status: Married    Spouse name: Currie Douse    Number of children: 3   Years of education: 16   Highest education level: Bachelor's degree (e.g.,  BA, AB, BS)  Occupational History   Occupation: Retired    Comment: Charity fundraiser  Tobacco Use   Smoking status:  Former    Current packs/day: 0.00    Average packs/day: 1 pack/day for 15.0 years (15.0 ttl pk-yrs)    Types: Cigarettes    Start date: 05/27/1990    Quit date: 05/27/2005    Years since quitting: 18.9    Passive exposure: Never   Smokeless tobacco: Never  Vaping Use   Vaping status: Never Used  Substance and Sexual Activity   Alcohol use: No    Alcohol/week: 0.0 standard drinks of alcohol   Drug use: No   Sexual activity: Not Currently    Partners: Male    Birth control/protection: Surgical    Comment: 1st intercourse 78 yo-Fewer than 5 partners, hysterectomy  Other Topics Concern   Not on file  Social History Narrative   Lives with husband.   Caffeine use: 1/2 cup per day   Exercise-- 2days a week [employer],  Water  aerobics, walking       Retired Engineer, civil (consulting)   No dietary restrictions, tries to maintain a heart healthy diet   Very pleasant lady   Social Drivers of Corporate investment banker Strain: Low Risk  (03/04/2024)   Overall Financial Resource Strain (CARDIA)    Difficulty of Paying Living Expenses: Not hard at all  Food Insecurity: No Food Insecurity (03/04/2024)   Hunger Vital Sign    Worried About Running Out of Food in the Last Year: Never true    Ran Out of Food in the Last Year: Never true  Transportation Needs: No Transportation Needs (03/04/2024)   PRAPARE - Administrator, Civil Service (Medical): No    Lack of Transportation (Non-Medical): No  Physical Activity: Insufficiently Active (03/04/2024)   Exercise Vital Sign    Days of Exercise per Week: 3 days    Minutes of Exercise per Session: 20 min  Stress: No Stress Concern Present (03/04/2024)   [employer] of Occupational Health - Occupational Stress Questionnaire    Feeling of Stress : Not at all  Social Connections: Socially Integrated (03/04/2024)   Social Connection and Isolation Panel  [NHANES]    Frequency of Communication with Friends and Family: More than three times a week    Frequency of Social Gatherings with Friends and Family: More than three times a week    Attends Religious Services: More than 4 times per year    Active Member of [employer] or Organizations: Yes    Attends Engineer, structural: More than 4 times per year    Marital Status: Married  Catering manager Violence: Not At Risk (03/04/2024)   Humiliation, Afraid, Rape, and Kick questionnaire    Fear of Current or Ex-Partner: No    Emotionally Abused: No    Physically Abused: No    Sexually Abused: No    Outpatient Medications Prior to Visit  Medication Sig Dispense Refill   acetaminophen  (TYLENOL ) 500 MG tablet Take 1,000 mg by mouth every 6 (six) hours as needed for mild pain or headache.      albuterol  (VENTOLIN  HFA) 108 (90 Base) MCG/ACT inhaler Inhale 2 puffs into the lungs every 4 (four) hours as needed for wheezing or shortness of breath (coughing fits). 18 g 1   amLODipine  (NORVASC ) 5 MG tablet Take 1 tablet by mouth once daily 90 tablet 3   aspirin  EC 81 MG tablet Take 1 tablet (81 mg total) by mouth daily. 90 tablet 3   atenolol  (TENORMIN ) 25 MG tablet Take 1 tablet by mouth twice daily 180 tablet 3  carboxymethylcellul-glycerin (OPTIVE) 0.5-0.9 % ophthalmic solution Place 1 drop into both eyes daily as needed for dry eyes.     Cholecalciferol  (VITAMIN D3) 50 MCG (2000 UT) CAPS Take 2,000 Units by mouth daily.     cyanocobalamin  (VITAMIN B12) 1000 MCG tablet Take 1,000 mg by mouth daily with breakfast.     diclofenac Sodium (VOLTAREN) 1 % GEL Apply 2 g topically daily as needed (Back pain).     DULoxetine  (CYMBALTA ) 60 MG capsule Take 1 capsule (60 mg total) by mouth 2 (two) times daily. 180 capsule 1   EPINEPHrine  0.3 mg/0.3 mL IJ SOAJ injection Inject 0.3 mg into the muscle as needed for anaphylaxis. 2 each 1   Evolocumab  (REPATHA  SURECLICK) 140 MG/ML SOAJ Inject 140 mg into the skin  every 14 (fourteen) days. 6 mL 0   famotidine  (PEPCID ) 20 MG tablet TAKE 1 TABLET BY MOUTH TWICE DAILY AS NEEDED FOR  HEARTBURN  OR  INDIGESTION 60 tablet 0   FLUoxetine  (PROZAC ) 20 MG capsule Take 1 capsule (20 mg total) by mouth daily. 30 capsule 2   fluticasone  (FLONASE ) 50 MCG/ACT nasal spray Place 2 sprays into both nostrils daily. 16 g 5   fluticasone  (FLOVENT  HFA) 220 MCG/ACT inhaler Inhale 1-2 puffs into the lungs in the morning and at bedtime. 1 each 12   folic acid  (FOLVITE ) 1 MG tablet Take 1 tablet (1 mg total) by mouth daily.     furosemide  (LASIX ) 20 MG tablet TAKE 1 TABLET BY MOUTH AS NEEDED FOR EDEMA 90 tablet 3   gabapentin  (NEURONTIN ) 300 MG capsule Take 300-900 mg by mouth See admin instructions. Take 600 mg in the morning 300 mg at lunch time and 900 mg at bedtime     hydrOXYzine  (ATARAX ) 10 MG tablet Take 0.5-2 tablets (5-20 mg total) by mouth 3 (three) times daily as needed for anxiety (not with Loratadine ). 90 tablet 2   linaclotide  (LINZESS ) 72 MCG capsule Take 1 capsule (72 mcg total) by mouth daily before breakfast. 12 capsule    loratadine  (CLARITIN ) 10 MG tablet Take 10 mg by mouth daily as needed for rhinitis.     methylPREDNISolone  (MEDROL ) 4 MG tablet 6 tabs po x 1 day then 5 tabs po x 1 day then 4 tabs po x 1 day then 3 tabs po x 1 day then 2 tabs po x 1 day then 1 tab po x 1 day and stop 21 tablet 0   nitroGLYCERIN  (NITROSTAT ) 0.4 MG SL tablet Place 1 tablet (0.4 mg total) under the tongue every 5 (five) minutes x 3 doses as needed for chest pain. 25 tablet 2   nystatin  cream (MYCOSTATIN ) Apply 1 application topically 2 (two) times daily. (Patient taking differently: Apply 1 application  topically daily as needed (vaginal yeast).) 30 g 2   pantoprazole  (PROTONIX ) 40 MG tablet Take 40 mg by mouth 2 (two) times daily.     polyethylene glycol (MIRALAX ) 17 g packet Take 17 g by mouth 2 (two) times daily. 14 each 0   sucralfate  (CARAFATE ) 1 GM/10ML suspension Take 10 mLs  (1 g total) by mouth 4 (four) times daily -  with meals and at bedtime. 420 mL 1   traMADol  (ULTRAM ) 50 MG tablet Take 1 tablet by mouth 3 (three) times daily as needed.     traZODone  (DESYREL ) 100 MG tablet Take 1 tablet (100 mg total) by mouth at bedtime. 90 tablet 0   Vitamin D , Ergocalciferol , (DRISDOL ) 1.25 MG (50000 UNIT) CAPS  capsule Take 1 capsule (50,000 Units total) by mouth every 7 (seven) days. 12x Weeks 5 capsule 3   Vonoprazan Fumarate  (VOQUEZNA ) 10 MG TABS Take 1 capsule by mouth daily.     pentosan polysulfate (ELMIRON ) 100 MG capsule Take 1 capsule (100 mg total) by mouth 3 (three) times daily. Reported on 01/05/2016 (Patient taking differently: Take 100 mg by mouth daily as needed (burning and pain). Reported on 01/05/2016) 90 capsule 11   No facility-administered medications prior to visit.    Allergies  Allergen Reactions   Clindamycin Hcl Shortness Of Breath and Rash   Penicillins Anaphylaxis   Rosuvastatin  Anaphylaxis   Baclofen  Rash   Codeine Hives and Nausea Only   Erythromycin Hives   Latex Hives   Lincomycin Other (See Comments)   Pentazocine Lactate Other (See Comments)    HALLUCINATION   Pneumococcal Vaccine Polyvalent Hives, Swelling and Other (See Comments)    REACTION: swelling at injection site   Prednisone  Rash    Other reaction(s): Rash, Hives - can tolerate if she takes with benadryl     Tamoxifen  Nausea And Vomiting   Haemophilus Influenzae Other (See Comments)    Local reaction at the site    Haemophilus Influenzae Vaccines Other (See Comments)    Local reaction at site   Levofloxacin      Other reaction(s): sick   Other Other (See Comments)    Father had malignant hyperthermia   Doxycycline  Rash   Fluzone [Influenza Virus Vaccine] Hives    Local reaction at the site    Review of Systems  Constitutional:  Negative for fever and malaise/fatigue.  HENT:  Negative for congestion.   Eyes:  Negative for blurred vision.  Respiratory:  Negative  for shortness of breath.   Cardiovascular:  Positive for leg swelling. Negative for chest pain and palpitations.  Gastrointestinal:  Negative for abdominal pain, blood in stool and nausea.  Genitourinary:  Negative for dysuria and frequency.  Musculoskeletal:  Positive for joint pain. Negative for falls.  Skin:  Negative for rash.  Neurological:  Negative for dizziness, loss of consciousness and headaches.  Endo/Heme/Allergies:  Negative for environmental allergies.  Psychiatric/Behavioral:  Positive for depression. The patient is nervous/anxious.        Objective:     Physical Exam Constitutional:      General: She is not in acute distress.    Appearance: Normal appearance. She is not ill-appearing or toxic-appearing.  HENT:     Head: Normocephalic and atraumatic.     Right Ear: External ear normal.     Left Ear: External ear normal.     Nose: Nose normal.  Eyes:     General:        Right eye: No discharge.        Left eye: No discharge.  Pulmonary:     Effort: Pulmonary effort is normal.  Skin:    Findings: No rash.  Neurological:     Mental Status: She is alert and oriented to person, place, and time.  Psychiatric:        Behavior: Behavior normal.     BP 138/78 Comment: per pt  Pulse 79 Comment: per pt  Ht 5' 4.5" (1.638 m)   Wt 123 lb 12.8 oz (56.2 kg) Comment: per pt  SpO2 92% Comment: per pt  BMI 20.92 kg/m  Wt Readings from Last 3 Encounters:  05/20/24 123 lb 12.8 oz (56.2 kg)  04/22/24 124 lb (56.2 kg)  04/21/24 128 lb (58.1 kg)  Assessment & Plan:  COPD with asthma (HCC) Assessment & Plan: No recent excerbation no changes to meds   Depression with anxiety Assessment & Plan: Doing well on current medications   Essential hypertension Assessment & Plan: Monitor and report, no changes to meds. Encouraged heart healthy diet such as the DASH diet and exercise as tolerated.     Hyperglycemia Assessment & Plan: hgba1c acceptable, minimize  simple carbs. Increase exercise as tolerated.    Hyperlipidemia, unspecified hyperlipidemia type Assessment & Plan: Statin intolerant Encourage heart healthy diet such as MIND or DASH diet, increase exercise, avoid trans fats, simple carbohydrates and processed foods, consider a krill or fish or flaxseed oil cap daily.     Stage 3 chronic kidney disease, unspecified whether stage 3a or 3b CKD (HCC) Assessment & Plan: Hydrate and monitor    Vitamin B12 deficiency Assessment & Plan: Supplement and monitor    Vitamin D  deficiency Assessment & Plan: Supplement and monitor       Assessment and Plan Assessment & Plan COPD with asthma Chronic obstructive pulmonary disease with asthma exacerbation. Increased dyspnea and hypoxemia noted. Albuterol  inhaler provides relief. Low-grade fever and chest congestion present. - Continue albuterol  inhaler as needed. - Perform deep breathing exercises 10 times hourly. - Monitor for high fever or purulent sputum and seek medical attention if symptoms develop. - Consider obtaining an incentive spirometer.  Interstitial cystitis Interstitial cystitis with recent exacerbations. Advised on hydration and dietary modifications. - Maintain adequate hydration. - Consider urinary tract-specific probiotics. - Avoid acidic and vinegary foods.  Colonoscopy preparation Upcoming colonoscopy and esophageal dilation on June 19th. Double preparation required due to previous incomplete colonoscopy. - Use Desitin with zinc or similar diaper cream during preparation. - Use electrolyte beverages like Pedialyte or Gatorade during preparation. - Prepare with double dose of preparation solution as instructed.     I discussed the assessment and treatment plan with the patient. The patient was provided an opportunity to ask questions and all were answered. The patient agreed with the plan and demonstrated an understanding of the instructions.   The patient was  advised to call back or seek an in-person evaluation if the symptoms worsen or if the condition fails to improve as anticipated.  Randie Bustle, MD [facility] [phone] (phone) [phone] (fax)  [facility]

## 2024-05-20 NOTE — Therapy (Signed)
 OUTPATIENT PHYSICAL THERAPY LOWER EXTREMITY TREATMENT   Patient Name: [name] MRN: [id number] DOB:1946-02-03, 78 y.o., female Today's Date: 05/20/2024  END OF SESSION:  PT End of Session - 05/20/24 1312     Visit Number 5    Number of Visits 25    Date for PT Re-Evaluation 07/26/24    Authorization Type [employer]    Progress Note Due on Visit 10    PT Start Time 1316    PT Stop Time 1359    PT Time Calculation (min) 43 min    Activity Tolerance Patient tolerated treatment well    Behavior During Therapy [facility] for tasks assessed/performed                 Past Medical History:  Diagnosis Date   Abdominal pain 10/26/2013   Allergic rhinitis 01/25/2016   ANA positive 07/29/2013   Anemia    Arthralgia 08/18/2013   Arthritis    Asthma, allergic 08/05/2012   Atrial flutter    past history- not current   Atypical chest pain 01/01/2015   Bilateral carpal tunnel syndrome 03/04/2015   CAD in native artery 08/25/2010   Minor CAD 2009, 2011, low risk Myoview  2013 and 2016        Cataract    bilaterally removed    Cellulitis of breast 02/05/2023   Chronic constipation    Chronic cough 01/09/2017   Chronic interstitial cystitis 02/01/2017   Chronic night sweats 01/08/2015   Common bile duct dilatation 02/13/2023   Concussion    x 3   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   DDD (degenerative disc disease), cervical 09/17/2018   Degenerative scoliosis 04/22/2018   Deviated septum 05/12/2016   Dysphagia    Dyspnea    Dysuria 03/09/2021   Elevated alkaline phosphatase level 01/25/2023   Essential hypertension 08/25/2010   External hemorrhoids 02/05/2015   Facial trauma 01/22/2023   Family history of adverse reaction to anesthesia    Family history of malignant hyperthermia    father had this   Fibrocystic breast disease 10/18/2010   Fibromyalgia    Frequency of urination    Generalized anxiety disorder 08/03/2010   GERD (gastroesophageal reflux  disease)    Headache 10/13/2013   High serum vitamin D  05/18/2021   History of chronic bronchitis    History of colonic polyp 02/25/2019   History of hiatal hernia    History of tachycardia    CONTROLLED  WITH ATENOLOL    Hyperglycemia 03/08/2021   Hyperkalemia 09/28/2022   Hyperlipidemia    Intertrigo 12/13/2022   Kidney stone 02/26/2019   Knee pain, right 04/30/2015   Left hip pain 04/30/2015   Leg pain 02/24/2011   Qualifier: Diagnosis of   By: Jalene Mayor DOAdel Holt         Leukocytosis 07/05/2022   Local reaction to hymenoptera sting 05/09/2023   Low back pain 09/08/2020   Major depressive disorder 02/05/2013   Malignant neoplasm of lower-outer quadrant of right breast of female, estrogen receptor positive 01/03/2013   Nasal turbinate hypertrophy 05/12/2016   Oral herpes 02/05/2023   OSA and COPD overlap syndrome 06/10/2020   Osteoporosis 01/2019   T score -2.2 stable/improved from prior study   Pelvic pain    Peripheral neuropathy 05/22/2014   Personal history of radiation therapy    Pneumonia    Primary insomnia 06/25/2017   Pulmonary nodules 02/12/2015   Rash and other nonspecific skin eruption 05/09/2023   Recurrent falls 05/02/2020   Recurrent  sinusitis 02/25/2019   Right arm pain 07/20/2016   RLS (restless legs syndrome) 11/09/2019   RUQ pain 07/05/2022   S/P radiation therapy 11/12/12 - 12/05/12   right Breast   Sciatica 04/30/2015   Sepsis 2014   from UTI    Sjogren's disease 12/12/2021   Splenic lesion 09/02/2012   Sprain of lateral ligament of ankle joint 07/31/2023   Stage 3 chronic kidney disease    Statin intolerance 02/29/2020   Stricture and stenosis of esophagus 10/19/2011   Tongue lesion 02/05/2023   Tremor 07/05/2022   Urgency of urination    Vertigo 01/22/2023   Vitamin B12 deficiency 03/08/2021   Vocal cord cyst 05/18/2021   Past Surgical History:  Procedure Laterality Date   24 HOUR PH STUDY N/A 04/03/2016   Procedure: 24 HOUR PH STUDY;   Surgeon: Danette Duos, MD;  Location: [facility];  Service: Gastroenterology;  Laterality: N/A;   ANKLE ARTHROSCOPY WITH RECONSTRUCTION Left 03/12/2024   Procedure: ANKLE ARTHROSCOPY WITH OPEN LATERAL ANKLE LIGAMENT STABILIZATION;  Surgeon: Ali Ink, MD;  Location: [facility];  Service: Orthopedics;  Laterality: Left;   BREAST BIOPSY Right 08/23/2012   ADH   BREAST BIOPSY Right 10/11/2012   Ductal Carcinoma   BREAST EXCISIONAL BIOPSY Right 09/04/2013   benign   BREAST LUMPECTOMY Right 10/11/2012   W/ SLN BX   CARDIAC CATHETERIZATION  09-13-2007  DR [name] Null   WELL-PRESERVED LVF/  DIFFUSE SCATTERED CORONARY CALCIFACATION AND ATHEROSCLEROSIS WITHOUT OBSTRUCTION   CARDIAC CATHETERIZATION  08-04-2010  DR MCALHANY   NON-OBSTRUCTIVE CAD/  pLAD 40%/  oLAD 30%/  mLAD 30%/  pRCA 30%/  EF 60%   CARDIOVASCULAR STRESS TEST  06-18-2012  DR McALHANY   LOW RISK NUCLEAR STUDY/  SMALL FIXED AREA OF MODERATELY DECREASED UPTAKE IN ANTEROSEPTAL WALL WHICH MAY BE ARTIFACTUAL/  NO ISCHEMIA/  EF 68%   COLONOSCOPY  09/29/2010   CYSTOSCOPY     CYSTOSCOPY WITH HYDRODISTENSION AND BIOPSY N/A 03/06/2014   Procedure: CYSTOSCOPY/HYDRODISTENSION/ INSTILATION OF MARCAINE  AND PYRIDIUM ;  Surgeon: Edmund Gouge, MD;  Location: [facility];  Service: Urology;  Laterality: N/A;   CYSTOSCOPY/URETEROSCOPY/HOLMIUM LASER/STENT PLACEMENT Left 03/21/2023   Procedure: CYSTOSCOPY LEFT RETROGRADE PYELOGRAM, LEFT URETEROSCOPY, HOLMIUM LASER LITHOTRIPSYM, AND LEFT URETERAL STENT PLACEMENT;  Surgeon: Adelbert Homans, MD;  Location: [facility];  Service: Urology;  Laterality: Left;  60 MINUTES   ESOPHAGEAL MANOMETRY N/A 04/03/2016   Procedure: ESOPHAGEAL MANOMETRY (EM);  Surgeon: Danette Duos, MD;  Location: [facility];  Service: Gastroenterology;  Laterality: N/A;   EXTRACORPOREAL SHOCK WAVE LITHOTRIPSY Left 02/06/2019   Procedure: EXTRACORPOREAL SHOCK WAVE  LITHOTRIPSY (ESWL);  Surgeon: Ottelin, Mark, MD;  Location: [facility];  Service: Urology;  Laterality: Left;   NASAL SINUS SURGERY  1985   ORIF RIGHT ANKLE  FX  2006   POLYPECTOMY     REMOVAL VOCAL CORD CYST  01/2013   REPAIR OF PERONEUS BREVIS TENDON Left 03/12/2024   Procedure: REPAIR OF PERONEUS TENDON;  Surgeon: Ali Ink, MD;  Location: [facility];  Service: Orthopedics;  Laterality: Left;   RIGHT BREAST BX  08/23/2012   RIGHT HAND SURGERY  X3  LAST ONE 2009   INCLUDES  ORIF RIGHT 5TH FINGER AND REVISION TWICE   SKIN CANCER EXCISION     face,arms   TONSILLECTOMY AND ADENOIDECTOMY  AGE 61   TOTAL ABDOMINAL HYSTERECTOMY W/ BILATERAL SALPINGOOPHORECTOMY  1982   W/  APPENDECTOMY   TRANSTHORACIC ECHOCARDIOGRAM  06/24/2012  GRADE I DIASTOLIC DYSFUNCTION/  EF 55-60%/  MILD MR   UPPER GASTROINTESTINAL ENDOSCOPY     Patient Active Problem List   Diagnosis Date Noted   Pedal edema 05/20/2024   Otalgia of both ears 04/23/2024   Tinnitus of both ears 04/23/2024   Hypokalemia 12/10/2023   IDA (iron deficiency anemia) 09/12/2023   Stage 3 chronic kidney disease    Right ankle pain 07/31/2023   Common bile duct dilatation 02/13/2023   Oral herpes 02/05/2023   Elevated alkaline phosphatase level 01/25/2023   Vertigo 01/22/2023   Intertrigo 12/13/2022   Hyperkalemia 09/28/2022   Anemia 09/28/2022   Tremor 07/05/2022   RUQ pain 07/05/2022   Leukocytosis 07/05/2022   Sjogren's disease 12/12/2021   Vocal cord cyst 05/18/2021   Vitamin D  deficiency 05/18/2021   Dysuria 03/09/2021   Vitamin B12 deficiency 03/08/2021   Hyperglycemia 03/08/2021   OSA and COPD overlap syndrome 06/10/2020   Recurrent falls 05/02/2020   Statin intolerance 02/29/2020   RLS (restless legs syndrome) 11/09/2019   Recurrent sinusitis 02/25/2019   History of colonic polyp 02/25/2019   DDD (degenerative disc disease), cervical 09/17/2018   Degenerative scoliosis 04/22/2018   Primary  insomnia 06/25/2017   Chronic interstitial cystitis 02/01/2017   Deviated nasal septum 05/12/2016   Hypertrophy of nasal turbinates 05/12/2016   Dysphagia    Allergic rhinitis 01/25/2016   Dyspnea    Sciatica 04/30/2015   Bilateral carpal tunnel syndrome 03/04/2015   Hyperlipidemia 03/01/2015   Pulmonary nodules 02/12/2015   External hemorrhoids 02/05/2015   Chronic night sweats 01/08/2015   Peripheral neuropathy 05/22/2014   Headache 10/13/2013   Arthralgia 08/18/2013   ANA positive 07/29/2013   Depression with anxiety 02/05/2013   Malignant neoplasm of lower-outer quadrant of right breast of female, estrogen receptor positive 01/03/2013   Asthma, allergic 08/05/2012   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   GERD (gastroesophageal reflux disease) 06/03/2012   Stricture and stenosis of esophagus 10/19/2011   Osteoporosis 04/17/2011   Fibrocystic breast disease 10/18/2010   Essential hypertension 08/25/2010   CAD in native artery 08/25/2010   Generalized anxiety disorder 08/03/2010   Bronchitis 03/10/2010   Fibromyalgia 01/11/2010    PCP: Neda Balk, MD   REFERRING PROVIDER: Ali Ink, MD   REFERRING DIAG: [phone] (ICD-10-CM) - Peroneal tendinitis, left leg   THERAPY DIAG:  Pain in left ankle and joints of left foot  Muscle weakness (generalized)  Other abnormalities of gait and mobility  Localized edema  Rationale for Evaluation and Treatment: Rehabilitation  ONSET DATE: 03/12/24  SUBJECTIVE:   SUBJECTIVE STATEMENT: Patient traveled up to [place]  and [place]  since last session and this caused more pain/swelling in the ankle.   PERTINENT HISTORY: S/p Lt ankle arthroscopy Brostrom stabilization, peroneus brevis and longus debridement and tenosynovectomy 03/12/24 See extensive PMH above  PAIN:  Are you having pain? Yes: NPRS scale: 4 Pain location: Lt lateral ankle and arch Pain description: throbbing, sharp Aggravating factors: palpation,  rubbing it on the sheet (wakes every hour), walking Relieving factors: pain medication  PRECAUTIONS: Fall,progress per protocol   RED FLAGS: None   WEIGHT BEARING RESTRICTIONS: Yes LLE WBAT In ASO brace   FALLS:  Has patient fallen in last 6 months? Yes. Number of falls 2 (fell stepping up grassy hill, fell in her kitchen from turning quickly)  LIVING ENVIRONMENT: Lives with: lives with their spouse Lives in: House/apartment Stairs: Yes: Internal: 16 steps; on left going up Has following equipment at home: Single  point cane  OCCUPATION: retired   PLOF: Independent  PATIENT GOALS: "I want to be able to walk again."  NEXT MD VISIT: End of June 2025   OBJECTIVE:  Note: Objective measures were completed at Evaluation unless otherwise noted.  DIAGNOSTIC FINDINGS: none on file   PATIENT SURVEYS:  FADI: 46/104; 44%  COGNITION: Overall cognitive status: Within functional limits for tasks assessed     SENSATION: See palpation  EDEMA:  Mild swelling about Lt ankle    PALPATION: Hypersensitivity to palpation about distal fibula, lateral malleolus   LOWER EXTREMITY ROM:  Active ROM Right eval Left eval 05/05/24 Left  05/20/24 Left   Hip flexion      Hip extension      Hip abduction      Hip adduction      Hip internal rotation      Hip external rotation      Knee flexion      Knee extension      Ankle dorsiflexion  6 9   Ankle plantarflexion  50  65  Ankle inversion  15  15  Ankle eversion  8  8   (Blank rows = not tested)  LOWER EXTREMITY MMT:  MMT Right eval Left eval  Hip flexion    Hip extension    Hip abduction    Hip adduction    Hip internal rotation    Hip external rotation    Knee flexion    Knee extension    Ankle dorsiflexion  3+  Ankle plantarflexion  3+ sitting  Ankle inversion  3+  Ankle eversion  3+   (Blank rows = not tested)    FUNCTIONAL TESTS:  TUG: 13.5 seconds Romberg x 30 seconds Semi-tandem unable    GAIT: Distance walked: 20 ft  Assistive device utilized: Single point cane. Ankle brace Level of assistance: Modified independence Comments: limited push-off LLE, foot ER,   [facility] Adult PT Treatment:                                                DATE: 05/20/24  Neuromuscular re-ed: Seated ankle rockerboard 2 x 10  Resisted ankle plantarflexion yellow 2 x 10  Resisted ankle inversion yellow 2 x 10  Resisted ankle eversion yellow d/c due to pain Seated SL calf raise on step 2 x 15 @ 5 lbs  Therapeutic Activity: Sit to stand 2 x 5  Weight shift side to side x 10 Weight shift A/P x 10 each    [facility] Adult PT Treatment:                                                DATE: 05/07/24 Therapeutic Exercise: Sidelying ankle eversion x 10  Reviewed and updated HEP  Manual Therapy: Demo and returned demo of use of tennis ball for plantar fascia release  Neuromuscular re-ed: Seated great toe extension 2 x 10  Seated ankle rockerboard 2 x 10  Resisted ankle PF yellow band d/c due to pain Seated calf raise 5 lbs 2 x 10   Self Care: Only wear brace for ambulation   [facility] Adult PT Treatment:  DATE: 05/05/24  Neuromuscular re-ed: Ankle inversion isometric 2 x 10  Ankle eversion isometric 2 x 10  Resisted ankle DF red band 2 x 10   Self Care: Energy conservation  Do not sleep in brace  Continue with ice Frequent bouts of short duration walking daily    PATIENT EDUCATION:  Education details:HEP update  Person educated: Patient Education method: Explanation, Demonstration, Tactile cues, Verbal cues, and Handouts Education comprehension: verbalized understanding, returned demonstration, verbal cues required, tactile cues required, and needs further education  HOME EXERCISE PROGRAM: Access Code: [email or web address] URL: [email or web address] Date: 05/20/2024 Prepared by: Forrestine Ike  Exercises - Long Sitting Calf Stretch with  Strap  - 1 x daily - 7 x weekly - 3 sets - 30s ec  hold - Seated Toe Raise  - 1 x daily - 7 x weekly - 2 sets - 10 reps - Seated Ankle Circles  - 1 x daily - 7 x weekly - 2 sets - 10 reps - Isometric Ankle Eversion at Wall  - 1 x daily - 7 x weekly - 2 sets - 10 reps - Seated Plantar Fascia Mobilization with Small Ball  - 1 x daily - 7 x weekly - 3-5 minute  hold - Seated Great Toe Extension  - 1 x daily - 7 x weekly - 2 sets - 10 reps - Long Sitting Ankle Plantar Flexion with Resistance  - 1 x daily - 7 x weekly - 2 sets - 10 reps - Long Sitting Ankle Inversion with Resistance  - 1 x daily - 7 x weekly - 2 sets - 10 reps - Seated Calf Raise with Weights on Thighs  - 1 x daily - 7 x weekly - 2 sets - 10 reps  ASSESSMENT:  CLINICAL IMPRESSION: Ankle plantarflexion AROM has improved compared to initial evaluation. Able to progress resisted ankle strengthening with patient able to perform resisted plantarflexion and inversion without pain. With resisted eversion she reported sharp pain about lateral ankle, so this was discontinued. With weight shifts she requires occasional Min A to maintain balance.   EVAL: Patient is a 78 y.o. female who was seen today for physical therapy evaluation and treatment for s/p Lt ankle arthroscopy Brostrom stabilization, peroneus brevis and longus debridement and tenosynovectomy on 03/12/24. She demonstrates ROM, strength, gait and balance deficits that are consistent with her recent post-operative status. She will benefit from skilled PT to address the above stated deficits in order to return to optimal function.   OBJECTIVE IMPAIRMENTS: Abnormal gait, decreased activity tolerance, decreased balance, decreased endurance, decreased knowledge of condition, decreased mobility, difficulty walking, decreased ROM, decreased strength, increased edema, impaired flexibility, impaired sensation, improper body mechanics, postural dysfunction, and pain.   ACTIVITY LIMITATIONS:  carrying, lifting, bending, standing, squatting, sleeping, stairs, transfers, and locomotion level  PARTICIPATION LIMITATIONS: meal prep, cleaning, laundry, shopping, and community activity  PERSONAL FACTORS: Age, Fitness, Time since onset of injury/illness/exacerbation, and 3+ comorbidities: see PMH above are also affecting patient's functional outcome.   REHAB POTENTIAL: Good  CLINICAL DECISION MAKING: Evolving/moderate complexity  EVALUATION COMPLEXITY: Moderate   GOALS: Goals reviewed with patient? Yes  SHORT TERM GOALS: Target date: 06/09/2024   Patient will be independent and compliant with initial HEP.   Baseline: issued at eval  Goal status: INITIAL  2.  Patient will demonstrate at least 60 degrees of Lt ankle plantarflexion AROM to improve push-off during gait cycle.  Baseline: see above Goal status: INITIAL  3.  Patient will demonstrate at least  10 degrees of Lt ankle dorsiflexion AROM to improve gait mechanics.  Baseline: see above Goal status: INITIAL  4.  Patient will complete TUG in </= 12 seconds to reduce fall risk.  Baseline: see above Goal status: INITIAL    LONG TERM GOALS: Target date: 07/26/24  Patient will score >/= 60/104 on the Foot and Ankle Disability Index (MDC 7) to signify clinically meaningful improvement in functional abilities.   Baseline: see above Goal status: INITIAL  2.  Patient will maintain tandem stance for at least 10 seconds to improve gait stability.  Baseline: see above Goal status: INITIAL  3.  Patient will be modified independent with curb/stair negotiation.  Baseline: uses cane and requires support from spouse  Goal status: INITIAL  4.  Patient will self-report ability to tolerate at least 30 minutes of standing/walking activity.  Baseline: 10 minutes  Goal status: INITIAL  5.  Patient will demonstrate at least 4/5 Lt ankle strength to improve stability when navigating on uneven terrain.  Baseline: see above  Goal  status: INITIAL  6.  Patient will be independent with advanced home program to progress/maintain current level of function.  Baseline: initial HEP issued  Goal status: INITIAL   PLAN:  PT FREQUENCY: 2x/week  PT DURATION: 12 weeks  PLANNED INTERVENTIONS: 97164- PT Re-evaluation, 97750- Physical Performance Testing, 97110-Therapeutic exercises, 97530- Therapeutic activity, [id number]- Neuromuscular re-education, 97535- Self Care, 16109- Manual therapy, [id number]- Gait training, [id number]- Aquatic Therapy, 97016- Vasopneumatic device, Taping, Dry Needling, Cryotherapy, and Moist heat  PLAN FOR NEXT SESSION: review and progress HEP; ankle AROM and strengthening as tolerated; static balance.    Aarya Robinson, PT, DPT, ATC 05/20/24 2:00 PM

## 2024-05-20 NOTE — Assessment & Plan Note (Signed)
 Is having an upper endoscopy with stretching on 6/19 will also have a colonoscopy.

## 2024-05-21 ENCOUNTER — Ambulatory Visit (INDEPENDENT_AMBULATORY_CARE_PROVIDER_SITE_OTHER): Admitting: Professional

## 2024-05-21 ENCOUNTER — Encounter: Payer: Self-pay | Admitting: Professional

## 2024-05-21 DIAGNOSIS — F411 Generalized anxiety disorder: Secondary | ICD-10-CM | POA: Diagnosis not present

## 2024-05-21 DIAGNOSIS — F331 Major depressive disorder, recurrent, moderate: Secondary | ICD-10-CM | POA: Diagnosis not present

## 2024-05-21 DIAGNOSIS — F4310 Post-traumatic stress disorder, unspecified: Secondary | ICD-10-CM

## 2024-05-21 DIAGNOSIS — H43813 Vitreous degeneration, bilateral: Secondary | ICD-10-CM | POA: Diagnosis not present

## 2024-05-21 DIAGNOSIS — H52223 Regular astigmatism, bilateral: Secondary | ICD-10-CM | POA: Diagnosis not present

## 2024-05-21 DIAGNOSIS — Z961 Presence of intraocular lens: Secondary | ICD-10-CM | POA: Diagnosis not present

## 2024-05-21 DIAGNOSIS — H04123 Dry eye syndrome of bilateral lacrimal glands: Secondary | ICD-10-CM | POA: Diagnosis not present

## 2024-05-21 NOTE — Progress Notes (Signed)
 [facility] Counselor/Therapist Progress Note  Patient ID: [name], MRN: [id number],    Date: 05/21/2024  Time Spent: 41 minutes 1237-118pm   Treatment Type: Individual Therapy  Risk Assessment: Danger to Self:  No Self-injurious Behavior: No Danger to Others: No  Subjective: This session was held via video teletherapy. The patient consented to video teletherapy and was located in her home during this session. She is aware it is the responsibility of the patient to secure confidentiality on her end of the session. The provider was in a private home office for the duration of this session.    The patient arrived on time for the [name] appointment.   Issues addressed: 1-mood a-positive and bright mood -pt feels like she's grown -pt notices that people are speaking more kindly to her but then realized that she has changed 2-physical -pt was worn out Sunday-Tuesday after returning from [place] and was in bed -swelling is better -she attended [facility] yesterday and made some gains -PT encouraging her to get her foot straight she will not be a all risk 3-husband [name] -the night before graduation, [name] found out his lifetime best friend Charlie (including military) died the night before -they left [place] to [place] for the funeral and felt so welcomed -[name] spoke with Charlie a month ago if he had given his life to [employer] years before 4-[employer]\'s graduation -was absolutely wonderful -450 students and K[employer]ley was recognized as most outstanding student of 950 in the district -she received a scholarship -4.7 GPA -interactions were positive with both her girls 6-visit with sister [name] -was a better visit they have had in past -she thinks her sister was really missing me -when she came in the home she was "hugging me tight and didn\'t want to turn me loose" -she was "almost like a sister" and "I was happy with that" -sister wants to get back together -sister has  invited her to her beach house when her PT is complete 7-wants to visit son\'s family in [place]  Treatment Plan Problems: anxiety, depression, family conflict Goals: Alleviate depressive symptoms Recognize, accept, and cope with depressive feelings Develop healthy thinking patterns Develop healthy interpersonal relationships (particularly younger daughter) Reduce overall frequency, intensity, and duration of anxiety Stabilize anxiety level while increasing ability to function Enhance ability to effectively cope with full variety of stressors Learn and implement coping skills that result in a reduction of anxiety   Objectives target date for all objectives is 09/26/2024: Verbalize an understanding of the cognitive, physiological, and behavioral components of anxiety    60 Learning and implement calming skills to reduce overall anxiety         60 Verbalize an understanding of the role that cognitive biases play in excessive irrational worry and persistent anxiety symptoms 70 Identify, challenge, and replace biased fearful self-talk  60 Learn and implement problem solving strategies   70 Identify and engage in pleasant activities    80 Learning and implement personal and interpersonal skills to reduce anxiety and improve interpersonal relationships   60 Learn to accept limitations in life and commit to tolerating, rather than avoiding, unpleasant emotions while accomplishing meaningful goal 60 Identify major life conflicts from the past and present that form the basis for present anxiety      70  Maintain involvement in work, family, and social activities 37 Reestablish a consistent sleep-wake cycle   70 Verbalize an accurate understanding of depression  60 Identify and replace thoughts that support depression  53 Learn  and implement behavioral strategies   60 Verbalize an understanding and resolution of current interpersonal problems       60 Learn and implement decision making  skills   60 Learn and implement conflict resolution skills to resolve interpersonal problems       60 Verbalize an understanding of healthy and unhealthy emotions verbalize insight into how past relationships may be influence current experiences with depression     60 Use mindfulness and acceptance strategies and increase value based behavior        60 Increase hopeful statements about the future.    60 Interventions: Engage the patient in behavioral activation Use instruction, modeling, and role-playing to build the patient 's general social, communication, and/or conflict resolution skills Use Acceptance and Commitment Therapy to help patient  accept uncomfortable realities in order to accomplish value-consistent goals Reinforce the patient 's insight into the role of her past emotional pain and present anxiety  Support the patient in following through with work, family, and social activities Teach and implement sleep hygiene practices  Teach the patient relaxation skills Assign the patient homework Discuss examples demonstrating that unrealistic worry overestimates the probability of threats and underestimates patient's ability  Assist the patient in analyzing his or her worries Help patient understand that avoidance is reinforcing  Consistent with treatment model, discuss how change in cognitive, behavioral, and interpersonal can help patient alleviate depression CBT Behavioral activation to help the patient explore the relationship, nature of the dispute Help the patient develop new interpersonal skills and relationships Conduct Problem solving therapy Teach conflict resolution skills Use a process-experiential approach Conduct ACT  Diagnosis:Major depressive disorder, recurrent episode, moderate (HCC)  Generalized anxiety disorder  PTSD (post-traumatic stress disorder)  Plan:  -meet on Tuesday, June 03, 2024 at [employer].

## 2024-05-22 ENCOUNTER — Encounter

## 2024-05-22 NOTE — Progress Notes (Signed)
 NEUROLOGY CONSULTATION NOTE  [name] MRN: [id number] DOB: August 14, 1946   Referring provider: Randie Bustle, MD Primary care provider: Randie Bustle, MD  Reason for consult:  Occipital headaches   Assessment/Plan:   Occipital headaches   Etiology is unclear, but suspect that this is related to medication overuse causing rebound headaches.  She reports that her anxiety and depression are being well addressed, and has improved her headaches, therefore, at this moment will not order any imaging studies given that the prior recent one was unremarkable.  If symptoms worsen despite having made all the changes below, then we will consider proceeding with imaging.  Recommendations are as follows:   Limit use of pain relievers to no more than 2 days out of week to prevent risk of rebound or medication-overuse headache.  Currently, she is taking Tylenol , Advil or Aleve every other day, tramadol  daily, as well as aspirin  daily.  She could be experiencing rebound headache from taking these painkillers frequently. Consider increasing gabapentin  midday dose to 600 mg, currently at 300 mg.  This is the time that she complains of worse symptoms.  This is prescribed by her PCP Keep headache diary  Exercise, hydration, caffeine cessation, sleep hygiene, monitor for and avoid triggers  Consider:  magnesium  citrate 400mg  daily, riboflavin 400mg  daily, and coenzyme Q10 100 mg three times daily Referral to vestibular therapy for known  BPPV Follow-up in 6 months    Subjective:   78 year old right-handed with extensive medical history including hypertension, hyperlipidemia, multiple drug allergies, polypharmacy, osteoporosis, depression, right breast cancer status post radiation therapy 2013, chronic pain and fibromyalgia, Sjogren's syndrome, COPD, CAD, history of chronic BPPV, subjective memory loss with negative workup, presenting to the office for evaluation of daily occipital headaches.  Onset: "I  have been on and off for long time, I have tension headaches and migraine headaches ".  "I did actually better 2 months ago I started to do psychotherapy which helped me a lot" Quality: "Tension " Intensity: "Not severe ", much better than before Location: In the back of the head towards the left eye and sometimes in the forehead Duration: "It hurts more in the middle of the day " Frequency: "Every day " Associated symptoms: She reports photophobia at times, sound bothers her when she had these headaches, "sometimes I have nausea and vomiting.  ". Aura: "Sometimes I see bright lights" Activity: No Aggravating factors: When I lie down it may be worse, I take Tylenol  every 6-8 hours and I alternate with ibuprofen, this seems to help.  She also reports that when anxious it may be worse.  She is doing psychotherapy which seems to be helping her Denies any vision changes.  "I just had my eyes checked and I need better glasses ". Relieving factors: "Medicines ", psychotherapy Anemia?  Endorsed Any appetite changes?  Drinks plenty water , tries to do any healthy diet Current abortive medications: Advil Tylenol  every 6-8 hours and I alternate with ibuprofen twice a day, I take baby aspirin , tramadol  and gabapentin  Current prophylactic medications: None  Past abortive medications: None Past prophylactic medications: None  Frequency of medications: Tylenol  or Advil every 6-8 hours.  Tramadol  at night.  Gabapentin   3 times a day Family history:  Smoker: No Alcohol: No Caffeine: No Sleep: Good   Mood?  "I have been going to counseling 2 times a week which has helped, she told me helped to be more calm and peaceful, write everything down, putting in the box and  turn it over to the [facility] "    PAST MEDICAL HISTORY: Past Medical History:  Diagnosis Date   Abdominal pain 10/26/2013   Allergic rhinitis 01/25/2016   ANA positive 07/29/2013   Anemia    Arthralgia 08/18/2013   Arthritis    Asthma,  allergic 08/05/2012   Atrial flutter    past history- not current   Atypical chest pain 01/01/2015   Bilateral carpal tunnel syndrome 03/04/2015   CAD in native artery 08/25/2010   Minor CAD 2009, 2011, low risk Myoview  2013 and 2016        Cataract    bilaterally removed    Cellulitis of breast 02/05/2023   Chronic constipation    Chronic cough 01/09/2017   Chronic interstitial cystitis 02/01/2017   Chronic night sweats 01/08/2015   Common bile duct dilatation 02/13/2023   Concussion    x 3   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   DDD (degenerative disc disease), cervical 09/17/2018   Degenerative scoliosis 04/22/2018   Deviated septum 05/12/2016   Dysphagia    Dyspnea    Dysuria 03/09/2021   Elevated alkaline phosphatase level 01/25/2023   Essential hypertension 08/25/2010   External hemorrhoids 02/05/2015   Facial trauma 01/22/2023   Family history of adverse reaction to anesthesia    Family history of malignant hyperthermia    father had this   Fibrocystic breast disease 10/18/2010   Fibromyalgia    Frequency of urination    Generalized anxiety disorder 08/03/2010   GERD (gastroesophageal reflux disease)    Headache 10/13/2013   High serum vitamin D  05/18/2021   History of chronic bronchitis    History of colonic polyp 02/25/2019   History of hiatal hernia    History of tachycardia    CONTROLLED  WITH ATENOLOL    Hyperglycemia 03/08/2021   Hyperkalemia 09/28/2022   Hyperlipidemia    Intertrigo 12/13/2022   Kidney stone 02/26/2019   Knee pain, right 04/30/2015   Left hip pain 04/30/2015   Leg pain 02/24/2011   Qualifier: Diagnosis of   By: Jalene Mayor DOAdel Holt         Leukocytosis 07/05/2022   Local reaction to hymenoptera sting 05/09/2023   Low back pain 09/08/2020   Major depressive disorder 02/05/2013   Malignant neoplasm of lower-outer quadrant of right breast of female, estrogen receptor positive 01/03/2013   Nasal turbinate hypertrophy 05/12/2016   Oral  herpes 02/05/2023   OSA and COPD overlap syndrome 06/10/2020   Osteoporosis 01/2019   T score -2.2 stable/improved from prior study   Pelvic pain    Peripheral neuropathy 05/22/2014   Personal history of radiation therapy    Pneumonia    Primary insomnia 06/25/2017   Pulmonary nodules 02/12/2015   Rash and other nonspecific skin eruption 05/09/2023   Recurrent falls 05/02/2020   Recurrent sinusitis 02/25/2019   Right arm pain 07/20/2016   RLS (restless legs syndrome) 11/09/2019   RUQ pain 07/05/2022   S/P radiation therapy 11/12/12 - 12/05/12   right Breast   Sciatica 04/30/2015   Sepsis 2014   from UTI    Sjogren's disease 12/12/2021   Splenic lesion 09/02/2012   Sprain of lateral ligament of ankle joint 07/31/2023   Stage 3 chronic kidney disease    Statin intolerance 02/29/2020   Stricture and stenosis of esophagus 10/19/2011   Tongue lesion 02/05/2023   Tremor 07/05/2022   Urgency of urination    Vertigo 01/22/2023   Vitamin B12 deficiency 03/08/2021   Vocal cord cyst  05/18/2021    PAST SURGICAL HISTORY: Past Surgical History:  Procedure Laterality Date   65 HOUR PH STUDY N/A 04/03/2016   Procedure: 24 HOUR PH STUDY;  Surgeon: Danette Duos, MD;  Location: [facility];  Service: Gastroenterology;  Laterality: N/A;   ANKLE ARTHROSCOPY WITH RECONSTRUCTION Left 03/12/2024   Procedure: ANKLE ARTHROSCOPY WITH OPEN LATERAL ANKLE LIGAMENT STABILIZATION;  Surgeon: Ali Ink, MD;  Location: [facility];  Service: Orthopedics;  Laterality: Left;   BREAST BIOPSY Right 08/23/2012   ADH   BREAST BIOPSY Right 10/11/2012   Ductal Carcinoma   BREAST EXCISIONAL BIOPSY Right 09/04/2013   benign   BREAST LUMPECTOMY Right 10/11/2012   W/ SLN BX   CARDIAC CATHETERIZATION  09-13-2007  DR [name] Null   WELL-PRESERVED LVF/  DIFFUSE SCATTERED CORONARY CALCIFACATION AND ATHEROSCLEROSIS WITHOUT OBSTRUCTION   CARDIAC CATHETERIZATION  08-04-2010  DR MCALHANY    NON-OBSTRUCTIVE CAD/  pLAD 40%/  oLAD 30%/  mLAD 30%/  pRCA 30%/  EF 60%   CARDIOVASCULAR STRESS TEST  06-18-2012  DR McALHANY   LOW RISK NUCLEAR STUDY/  SMALL FIXED AREA OF MODERATELY DECREASED UPTAKE IN ANTEROSEPTAL WALL WHICH MAY BE ARTIFACTUAL/  NO ISCHEMIA/  EF 68%   COLONOSCOPY  09/29/2010   CYSTOSCOPY     CYSTOSCOPY WITH HYDRODISTENSION AND BIOPSY N/A 03/06/2014   Procedure: CYSTOSCOPY/HYDRODISTENSION/ INSTILATION OF MARCAINE  AND PYRIDIUM ;  Surgeon: Edmund Gouge, MD;  Location: [facility] [place];  Service: Urology;  Laterality: N/A;   CYSTOSCOPY/URETEROSCOPY/HOLMIUM LASER/STENT PLACEMENT Left 03/21/2023   Procedure: CYSTOSCOPY LEFT RETROGRADE PYELOGRAM, LEFT URETEROSCOPY, HOLMIUM LASER LITHOTRIPSYM, AND LEFT URETERAL STENT PLACEMENT;  Surgeon: Adelbert Homans, MD;  Location: [facility];  Service: Urology;  Laterality: Left;  60 MINUTES   ESOPHAGEAL MANOMETRY N/A 04/03/2016   Procedure: ESOPHAGEAL MANOMETRY (EM);  Surgeon: Danette Duos, MD;  Location: [facility];  Service: Gastroenterology;  Laterality: N/A;   EXTRACORPOREAL SHOCK WAVE LITHOTRIPSY Left 02/06/2019   Procedure: EXTRACORPOREAL SHOCK WAVE LITHOTRIPSY (ESWL);  Surgeon: Ottelin, Mark, MD;  Location: [facility];  Service: Urology;  Laterality: Left;   NASAL SINUS SURGERY  1985   ORIF RIGHT ANKLE  FX  2006   POLYPECTOMY     REMOVAL VOCAL CORD CYST  01/2013   REPAIR OF PERONEUS BREVIS TENDON Left 03/12/2024   Procedure: REPAIR OF PERONEUS TENDON;  Surgeon: Ali Ink, MD;  Location: [facility];  Service: Orthopedics;  Laterality: Left;   RIGHT BREAST BX  08/23/2012   RIGHT HAND SURGERY  X3  LAST ONE 2009   INCLUDES  ORIF RIGHT 5TH FINGER AND REVISION TWICE   SKIN CANCER EXCISION     face,arms   TONSILLECTOMY AND ADENOIDECTOMY  AGE 89   TOTAL ABDOMINAL HYSTERECTOMY W/ BILATERAL SALPINGOOPHORECTOMY  1982   W/  APPENDECTOMY   TRANSTHORACIC ECHOCARDIOGRAM  06/24/2012   GRADE I  DIASTOLIC DYSFUNCTION/  EF 55-60%/  MILD MR   UPPER GASTROINTESTINAL ENDOSCOPY      MEDICATIONS: Current Outpatient Medications on File Prior to Visit  Medication Sig Dispense Refill   acetaminophen  (TYLENOL ) 500 MG tablet Take 1,000 mg by mouth every 6 (six) hours as needed for mild pain or headache.      albuterol  (VENTOLIN  HFA) 108 (90 Base) MCG/ACT inhaler Inhale 2 puffs into the lungs every 4 (four) hours as needed for wheezing or shortness of breath (coughing fits). 18 g 1   amLODipine  (NORVASC ) 5 MG tablet Take 1 tablet by mouth once daily 90 tablet 3  aspirin  EC 81 MG tablet Take 1 tablet (81 mg total) by mouth daily. 90 tablet 3   atenolol  (TENORMIN ) 25 MG tablet Take 1 tablet by mouth twice daily 180 tablet 3   carboxymethylcellul-glycerin (OPTIVE) 0.5-0.9 % ophthalmic solution Place 1 drop into both eyes daily as needed for dry eyes.     Cholecalciferol  (VITAMIN D3) 50 MCG (2000 UT) CAPS Take 2,000 Units by mouth daily.     cyanocobalamin  (VITAMIN B12) 1000 MCG tablet Take 1,000 mg by mouth daily with breakfast.     diclofenac Sodium (VOLTAREN) 1 % GEL Apply 2 g topically daily as needed (Back pain).     DULoxetine  (CYMBALTA ) 60 MG capsule Take 1 capsule (60 mg total) by mouth 2 (two) times daily. 180 capsule 1   EPINEPHrine  0.3 mg/0.3 mL IJ SOAJ injection Inject 0.3 mg into the muscle as needed for anaphylaxis. 2 each 1   Evolocumab  (REPATHA  SURECLICK) 140 MG/ML SOAJ Inject 140 mg into the skin every 14 (fourteen) days. 6 mL 0   famotidine  (PEPCID ) 20 MG tablet TAKE 1 TABLET BY MOUTH TWICE DAILY AS NEEDED FOR  HEARTBURN  OR  INDIGESTION 60 tablet 0   FLUoxetine  (PROZAC ) 20 MG capsule Take 1 capsule (20 mg total) by mouth daily. 30 capsule 2   fluticasone  (FLONASE ) 50 MCG/ACT nasal spray Place 2 sprays into both nostrils daily. 16 g 5   fluticasone  (FLOVENT  HFA) 220 MCG/ACT inhaler Inhale 1-2 puffs into the lungs in the morning and at bedtime. 1 each 12   folic acid  (FOLVITE ) 1 MG  tablet Take 1 tablet (1 mg total) by mouth daily.     furosemide  (LASIX ) 20 MG tablet TAKE 1 TABLET BY MOUTH AS NEEDED FOR EDEMA 90 tablet 3   gabapentin  (NEURONTIN ) 300 MG capsule Take 300-900 mg by mouth See admin instructions. Take 600 mg in the morning 300 mg at lunch time and 900 mg at bedtime     hydrOXYzine  (ATARAX ) 10 MG tablet Take 0.5-2 tablets (5-20 mg total) by mouth 3 (three) times daily as needed for anxiety (not with Loratadine ). 90 tablet 2   linaclotide  (LINZESS ) 72 MCG capsule Take 1 capsule (72 mcg total) by mouth daily before breakfast. 12 capsule    loratadine  (CLARITIN ) 10 MG tablet Take 10 mg by mouth daily as needed for rhinitis.     methylPREDNISolone  (MEDROL ) 4 MG tablet 6 tabs po x 1 day then 5 tabs po x 1 day then 4 tabs po x 1 day then 3 tabs po x 1 day then 2 tabs po x 1 day then 1 tab po x 1 day and stop 21 tablet 0   nitroGLYCERIN  (NITROSTAT ) 0.4 MG SL tablet Place 1 tablet (0.4 mg total) under the tongue every 5 (five) minutes x 3 doses as needed for chest pain. 25 tablet 2   nystatin  cream (MYCOSTATIN ) Apply 1 application topically 2 (two) times daily. (Patient taking differently: Apply 1 application  topically daily as needed (vaginal yeast).) 30 g 2   pantoprazole  (PROTONIX ) 40 MG tablet Take 40 mg by mouth 2 (two) times daily.     polyethylene glycol (MIRALAX ) 17 g packet Take 17 g by mouth 2 (two) times daily. 14 each 0   sucralfate  (CARAFATE ) 1 GM/10ML suspension Take 10 mLs (1 g total) by mouth 4 (four) times daily -  with meals and at bedtime. 420 mL 1   traMADol  (ULTRAM ) 50 MG tablet Take 1 tablet by mouth 3 (three) times daily  as needed.     traZODone  (DESYREL ) 100 MG tablet Take 1 tablet (100 mg total) by mouth at bedtime. 90 tablet 0   Vitamin D , Ergocalciferol , (DRISDOL ) 1.25 MG (50000 UNIT) CAPS capsule Take 1 capsule (50,000 Units total) by mouth every 7 (seven) days. 12x Weeks 5 capsule 3   Vonoprazan Fumarate  (VOQUEZNA ) 10 MG TABS Take 1 capsule by  mouth daily.     No current facility-administered medications on file prior to visit.    ALLERGIES: Allergies  Allergen Reactions   Clindamycin Hcl Shortness Of Breath and Rash   Penicillins Anaphylaxis   Rosuvastatin  Anaphylaxis   Baclofen  Rash   Codeine Hives and Nausea Only   Erythromycin Hives   Latex Hives   Lincomycin Other (See Comments)   Pentazocine Lactate Other (See Comments)    HALLUCINATION   Pneumococcal Vaccine Polyvalent Hives, Swelling and Other (See Comments)    REACTION: swelling at injection site   Prednisone  Rash    Other reaction(s): Rash, Hives - can tolerate if she takes with benadryl     Tamoxifen  Nausea And Vomiting   Haemophilus Influenzae Other (See Comments)    Local reaction at the site    Haemophilus Influenzae Vaccines Other (See Comments)    Local reaction at site   Levofloxacin      Other reaction(s): sick   Other Other (See Comments)    Father had malignant hyperthermia   Doxycycline  Rash   Fluzone [Influenza Virus Vaccine] Hives    Local reaction at the site    FAMILY HISTORY: Family History  Problem Relation Age of Onset   Rectal cancer Mother    Colon cancer Mother    Pancreatic cancer Mother    Diabetes Mother    Breast cancer Mother 50   Breast cancer Maternal Aunt        breast   Birth defects Maternal Aunt    Irritable bowel syndrome Son    Heart disease Son        CAD, MV replacement   Allergic Disorder Daughter    Diabetes Daughter    Colon cancer Father    Colon polyps Father    Diabetes Father    Stroke Father    Heart disease Father        CHF   Hyperlipidemia Father    Hypertension Father    Arthritis Father    Breast cancer Paternal Aunt    Breast cancer Paternal Aunt    Arthritis Paternal Uncle    Allergic Disorder Daughter    Heart disease Cousin        CAD, had a blood clot after stenting   Esophageal cancer Neg Hx    Stomach cancer Neg Hx     .  Objective:    General: No acute distress.   Patient appears well-groomed.   Head:  Normocephalic/atraumatic Eyes:  fundi examined but not visualized Neck: supple, no paraspinal tenderness, full range of motion Back: No paraspinal tenderness Heart: regular rate and rhythm Lungs: Clear to auscultation bilaterally. Vascular: No carotid bruits. Neurological Exam: Mental status: alert and oriented to person, place, and time, recent and remote memory intact, fund of knowledge intact, attention and concentration intact, speech fluent and not dysarthric, language intact. Cranial nerves: CN I: not tested CN II: pupils equal, round and reactive to light, visual fields intact CN III, IV, VI:  full range of motion, no nystagmus, no ptosis CN V: facial sensation intact. CN VII: upper and lower face symmetric CN VIII: hearing  intact CN IX, X: gag intact, uvula midline CN XI: sternocleidomastoid and trapezius muscles intact CN XII: tongue midline Bulk & Tone: normal, no fasciculations. Motor:  muscle strength 5/5 throughout Sensation:  Pinprick, temperature and vibratory sensation intact. Deep Tendon Reflexes:  2+ throughout,  toes downgoing.   Finger to nose testing:  Without dysmetria.   Heel to shin:  Without dysmetria.   Gait:  Normal station and stride.  Romberg negative.    Thank you for allowing me to take part in the care of this patient.   Time spent 51 minutes

## 2024-05-23 ENCOUNTER — Encounter: Payer: Self-pay | Admitting: Physician Assistant

## 2024-05-23 ENCOUNTER — Encounter: Admitting: Rehabilitative and Restorative Service Providers"

## 2024-05-23 ENCOUNTER — Ambulatory Visit: Admitting: Physician Assistant

## 2024-05-23 VITALS — BP 126/85 | HR 78 | Resp 20 | Ht 64.5 in | Wt 129.0 lb

## 2024-05-23 DIAGNOSIS — R2681 Unsteadiness on feet: Secondary | ICD-10-CM | POA: Diagnosis not present

## 2024-05-23 NOTE — Patient Instructions (Addendum)
 Follow up on Dec 17 at 11:30  Referral to vestibular therapy for benign positional  vertigo  Limit use of pain relievers to no more than 2 days out of the week.  These medications include acetaminophen , NSAIDs (ibuprofen/Advil/Motrin, naproxen/Aleve, triptans (Imitrex/sumatriptan), Excedrin, and narcotics.  This will help reduce risk of rebound headaches. Consider increasing the midday gabapentin  to 600 mg instead of 300 mg Be aware of common food triggers:  - Caffeine:  coffee, black tea, cola, Mt. Dew  - Chocolate  - Dairy:  aged cheeses (brie, blue, cheddar, gouda, Parmasan, provolone, romano, Swiss, etc), chocolate milk, buttermilk, sour cream, limit eggs and yogurt  - Nuts, peanut butter  - Alcohol  - Cereals/grains:  FRESH breads (fresh bagels, sourdough, doughnuts), yeast productions  - Processed/canned/aged/cured meats (pre-packaged deli meats, hotdogs)  - MSG/glutamate:  soy sauce, flavor enhancer, pickled/preserved/marinated foods  - Sweeteners:  aspartame (Equal, Nutrasweet).  Sugar and Splenda are okay  - Vegetables:  legumes (lima beans, lentils, snow peas, fava beans, pinto peans, peas, garbanzo beans), sauerkraut, onions, olives, pickles  - Fruit:  avocados, bananas, citrus fruit (orange, lemon, grapefruit), mango  - Other:  Frozen meals, macaroni and cheese Routine exercise Stay adequately hydrated (aim for 64 oz water  daily) Keep headache diary Maintain proper stress management Maintain proper sleep hygiene Do not skip meals Consider supplements:  magnesium  citrate 400mg  daily, riboflavin 400mg  daily, coenzyme Q10 100mg  three times daily.

## 2024-05-27 ENCOUNTER — Ambulatory Visit: Payer: Self-pay | Attending: Orthopaedic Surgery

## 2024-05-27 DIAGNOSIS — R2689 Other abnormalities of gait and mobility: Secondary | ICD-10-CM | POA: Insufficient documentation

## 2024-05-27 DIAGNOSIS — M25572 Pain in left ankle and joints of left foot: Secondary | ICD-10-CM | POA: Insufficient documentation

## 2024-05-27 DIAGNOSIS — R600 Localized edema: Secondary | ICD-10-CM | POA: Insufficient documentation

## 2024-05-27 DIAGNOSIS — R42 Dizziness and giddiness: Secondary | ICD-10-CM | POA: Insufficient documentation

## 2024-05-27 DIAGNOSIS — M6281 Muscle weakness (generalized): Secondary | ICD-10-CM | POA: Insufficient documentation

## 2024-05-27 DIAGNOSIS — R2681 Unsteadiness on feet: Secondary | ICD-10-CM | POA: Insufficient documentation

## 2024-05-27 NOTE — Therapy (Incomplete)
 OUTPATIENT PHYSICAL THERAPY LOWER EXTREMITY TREATMENT   Patient Name: [name] MRN: [id number] DOB:[birth year], 78 y.o., female Today's Date: 05/27/2024  END OF SESSION:        Past Medical History:  Diagnosis Date   Abdominal pain 10/26/2013   Allergic rhinitis 01/25/2016   ANA positive 07/29/2013   Anemia    Arthralgia 08/18/2013   Arthritis    Asthma, allergic 08/05/2012   Atrial flutter    past history- not current   Atypical chest pain 01/01/2015   Bilateral carpal tunnel syndrome 03/04/2015   CAD in native artery 08/25/2010   Minor CAD 2009, 2011, low risk Myoview  2013 and 2016        Cataract    bilaterally removed    Cellulitis of breast 02/05/2023   Chronic constipation    Chronic cough 01/09/2017   Chronic interstitial cystitis 02/01/2017   Chronic night sweats 01/08/2015   Common bile duct dilatation 02/13/2023   Concussion    x 3   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   DDD (degenerative disc disease), cervical 09/17/2018   Degenerative scoliosis 04/22/2018   Deviated septum 05/12/2016   Dysphagia    Dyspnea    Dysuria 03/09/2021   Elevated alkaline phosphatase level 01/25/2023   Essential hypertension 08/25/2010   External hemorrhoids 02/05/2015   Facial trauma 01/22/2023   Family history of adverse reaction to anesthesia    Family history of malignant hyperthermia    father had this   Fibrocystic breast disease 10/18/2010   Fibromyalgia    Frequency of urination    Generalized anxiety disorder 08/03/2010   GERD (gastroesophageal reflux disease)    Headache 10/13/2013   High serum vitamin D  05/18/2021   History of chronic bronchitis    History of colonic polyp 02/25/2019   History of hiatal hernia    History of tachycardia    CONTROLLED  WITH ATENOLOL    Hyperglycemia 03/08/2021   Hyperkalemia 09/28/2022   Hyperlipidemia    Intertrigo 12/13/2022   Kidney stone 02/26/2019   Knee pain, right 04/30/2015   Left hip pain 04/30/2015   Leg  pain 02/24/2011   Qualifier: Diagnosis of   By: Jalene Mayor DOAdel Holt         Leukocytosis 07/05/2022   Local reaction to hymenoptera sting 05/09/2023   Low back pain 09/08/2020   Major depressive disorder 02/05/2013   Malignant neoplasm of lower-outer quadrant of right breast of female, estrogen receptor positive 01/03/2013   Nasal turbinate hypertrophy 05/12/2016   Oral herpes 02/05/2023   OSA and COPD overlap syndrome 06/10/2020   Osteoporosis 01/2019   T score -2.2 stable/improved from prior study   Pelvic pain    Peripheral neuropathy 05/22/2014   Personal history of radiation therapy    Pneumonia    Primary insomnia 06/25/2017   Pulmonary nodules 02/12/2015   Rash and other nonspecific skin eruption 05/09/2023   Recurrent falls 05/02/2020   Recurrent sinusitis 02/25/2019   Right arm pain 07/20/2016   RLS (restless legs syndrome) 11/09/2019   RUQ pain 07/05/2022   S/P radiation therapy 11/12/12 - 12/05/12   right Breast   Sciatica 04/30/2015   Sepsis 2014   from UTI    Sjogren's disease 12/12/2021   Splenic lesion 09/02/2012   Sprain of lateral ligament of ankle joint 07/31/2023   Stage 3 chronic kidney disease    Statin intolerance 02/29/2020   Stricture and stenosis of esophagus 10/19/2011   Tongue lesion 02/05/2023   Tremor 07/05/2022  Urgency of urination    Vertigo 01/22/2023   Vitamin B12 deficiency 03/08/2021   Vocal cord cyst 05/18/2021   Past Surgical History:  Procedure Laterality Date   24 HOUR PH STUDY N/A 04/03/2016   Procedure: 24 HOUR PH STUDY;  Surgeon: Danette Duos, MD;  Location: [facility];  Service: Gastroenterology;  Laterality: N/A;   ANKLE ARTHROSCOPY WITH RECONSTRUCTION Left 03/12/2024   Procedure: ANKLE ARTHROSCOPY WITH OPEN LATERAL ANKLE LIGAMENT STABILIZATION;  Surgeon: Ali Ink, MD;  Location: [facility];  Service: Orthopedics;  Laterality: Left;   BREAST BIOPSY Right 08/23/2012   ADH   BREAST BIOPSY  Right 10/11/2012   Ductal Carcinoma   BREAST EXCISIONAL BIOPSY Right 09/04/2013   benign   BREAST LUMPECTOMY Right 10/11/2012   W/ SLN BX   CARDIAC CATHETERIZATION  09-13-2007  DR [name] Null   WELL-PRESERVED LVF/  DIFFUSE SCATTERED CORONARY CALCIFACATION AND ATHEROSCLEROSIS WITHOUT OBSTRUCTION   CARDIAC CATHETERIZATION  08-04-2010  DR MCALHANY   NON-OBSTRUCTIVE CAD/  pLAD 40%/  oLAD 30%/  mLAD 30%/  pRCA 30%/  EF 60%   CARDIOVASCULAR STRESS TEST  06-18-2012  DR McALHANY   LOW RISK NUCLEAR STUDY/  SMALL FIXED AREA OF MODERATELY DECREASED UPTAKE IN ANTEROSEPTAL WALL WHICH MAY BE ARTIFACTUAL/  NO ISCHEMIA/  EF 68%   COLONOSCOPY  09/29/2010   CYSTOSCOPY     CYSTOSCOPY WITH HYDRODISTENSION AND BIOPSY N/A 03/06/2014   Procedure: CYSTOSCOPY/HYDRODISTENSION/ INSTILATION OF MARCAINE  AND PYRIDIUM ;  Surgeon: Edmund Gouge, MD;  Location: [facility];  Service: Urology;  Laterality: N/A;   CYSTOSCOPY/URETEROSCOPY/HOLMIUM LASER/STENT PLACEMENT Left 03/21/2023   Procedure: CYSTOSCOPY LEFT RETROGRADE PYELOGRAM, LEFT URETEROSCOPY, HOLMIUM LASER LITHOTRIPSYM, AND LEFT URETERAL STENT PLACEMENT;  Surgeon: Adelbert Homans, MD;  Location: [facility];  Service: Urology;  Laterality: Left;  60 MINUTES   ESOPHAGEAL MANOMETRY N/A 04/03/2016   Procedure: ESOPHAGEAL MANOMETRY (EM);  Surgeon: Danette Duos, MD;  Location: [facility];  Service: Gastroenterology;  Laterality: N/A;   EXTRACORPOREAL SHOCK WAVE LITHOTRIPSY Left 02/06/2019   Procedure: EXTRACORPOREAL SHOCK WAVE LITHOTRIPSY (ESWL);  Surgeon: Ottelin, Mark, MD;  Location: [facility];  Service: Urology;  Laterality: Left;   NASAL SINUS SURGERY  1985   ORIF RIGHT ANKLE  FX  2006   POLYPECTOMY     REMOVAL VOCAL CORD CYST  01/2013   REPAIR OF PERONEUS BREVIS TENDON Left 03/12/2024   Procedure: REPAIR OF PERONEUS TENDON;  Surgeon: Ali Ink, MD;  Location: [facility];  Service: Orthopedics;  Laterality: Left;    RIGHT BREAST BX  08/23/2012   RIGHT HAND SURGERY  X3  LAST ONE 2009   INCLUDES  ORIF RIGHT 5TH FINGER AND REVISION TWICE   SKIN CANCER EXCISION     face,arms   TONSILLECTOMY AND ADENOIDECTOMY  AGE 28   TOTAL ABDOMINAL HYSTERECTOMY W/ BILATERAL SALPINGOOPHORECTOMY  1982   W/  APPENDECTOMY   TRANSTHORACIC ECHOCARDIOGRAM  06/24/2012   GRADE I DIASTOLIC DYSFUNCTION/  EF 55-60%/  MILD MR   UPPER GASTROINTESTINAL ENDOSCOPY     Patient Active Problem List   Diagnosis Date Noted   Pedal edema 05/20/2024   Otalgia of both ears 04/23/2024   Tinnitus of both ears 04/23/2024   Hypokalemia 12/10/2023   IDA (iron deficiency anemia) 09/12/2023   Stage 3 chronic kidney disease    Right ankle pain 07/31/2023   Common bile duct dilatation 02/13/2023   Oral herpes 02/05/2023   Elevated alkaline phosphatase level 01/25/2023   Vertigo 01/22/2023  Intertrigo 12/13/2022   Hyperkalemia 09/28/2022   Anemia 09/28/2022   Tremor 07/05/2022   RUQ pain 07/05/2022   Leukocytosis 07/05/2022   Sjogren's disease 12/12/2021   Vocal cord cyst 05/18/2021   Vitamin D  deficiency 05/18/2021   Dysuria 03/09/2021   Vitamin B12 deficiency 03/08/2021   Hyperglycemia 03/08/2021   OSA and COPD overlap syndrome 06/10/2020   Recurrent falls 05/02/2020   Statin intolerance 02/29/2020   RLS (restless legs syndrome) 11/09/2019   Recurrent sinusitis 02/25/2019   History of colonic polyp 02/25/2019   DDD (degenerative disc disease), cervical 09/17/2018   Degenerative scoliosis 04/22/2018   Primary insomnia 06/25/2017   Chronic interstitial cystitis 02/01/2017   Deviated nasal septum 05/12/2016   Hypertrophy of nasal turbinates 05/12/2016   Dysphagia    Allergic rhinitis 01/25/2016   Dyspnea    Sciatica 04/30/2015   Bilateral carpal tunnel syndrome 03/04/2015   Hyperlipidemia 03/01/2015   Pulmonary nodules 02/12/2015   External hemorrhoids 02/05/2015   Chronic night sweats 01/08/2015   Peripheral neuropathy  05/22/2014   Headache 10/13/2013   Arthralgia 08/18/2013   ANA positive 07/29/2013   Depression with anxiety 02/05/2013   Malignant neoplasm of lower-outer quadrant of right breast of female, estrogen receptor positive 01/03/2013   Asthma, allergic 08/05/2012   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   GERD (gastroesophageal reflux disease) 06/03/2012   Stricture and stenosis of esophagus 10/19/2011   Osteoporosis 04/17/2011   Fibrocystic breast disease 10/18/2010   Essential hypertension 08/25/2010   CAD in native artery 08/25/2010   Generalized anxiety disorder 08/03/2010   Bronchitis 03/10/2010   Fibromyalgia 01/11/2010    PCP: Neda Balk, MD   REFERRING PROVIDER: Ali Ink, MD   REFERRING DIAG: [phone] (ICD-10-CM) - Peroneal tendinitis, left leg   THERAPY DIAG:  No diagnosis found.  Rationale for Evaluation and Treatment: Rehabilitation  ONSET DATE: 03/12/24  SUBJECTIVE:   SUBJECTIVE STATEMENT: Patient traveled up to [place]  and [place]  since last session and this caused more pain/swelling in the ankle.   PERTINENT HISTORY: S/p Lt ankle arthroscopy Brostrom stabilization, peroneus brevis and longus debridement and tenosynovectomy 03/12/24 See extensive PMH above  PAIN:  Are you having pain? Yes: NPRS scale: 4 Pain location: Lt lateral ankle and arch Pain description: throbbing, sharp Aggravating factors: palpation, rubbing it on the sheet (wakes every hour), walking Relieving factors: pain medication  PRECAUTIONS: Fall,progress per protocol   RED FLAGS: None   WEIGHT BEARING RESTRICTIONS: Yes LLE WBAT In ASO brace   FALLS:  Has patient fallen in last 6 months? Yes. Number of falls 2 (fell stepping up grassy hill, fell in her kitchen from turning quickly)  LIVING ENVIRONMENT: Lives with: lives with their spouse Lives in: House/apartment Stairs: Yes: Internal: 16 steps; on left going up Has following equipment at home: Single point  cane  OCCUPATION: retired   PLOF: Independent  PATIENT GOALS: "I want to be able to walk again."  NEXT MD VISIT: End of June 2025   OBJECTIVE:  Note: Objective measures were completed at Evaluation unless otherwise noted.  DIAGNOSTIC FINDINGS: none on file   PATIENT SURVEYS:  FADI: 46/104; 44%  COGNITION: Overall cognitive status: Within functional limits for tasks assessed     SENSATION: See palpation  EDEMA:  Mild swelling about Lt ankle    PALPATION: Hypersensitivity to palpation about distal fibula, lateral malleolus   LOWER EXTREMITY ROM:  Active ROM Right eval Left eval 05/05/24 Left  05/20/24 Left   Hip flexion  Hip extension      Hip abduction      Hip adduction      Hip internal rotation      Hip external rotation      Knee flexion      Knee extension      Ankle dorsiflexion  6 9   Ankle plantarflexion  50  65  Ankle inversion  15  15  Ankle eversion  8  8   (Blank rows = not tested)  LOWER EXTREMITY MMT:  MMT Right eval Left eval  Hip flexion    Hip extension    Hip abduction    Hip adduction    Hip internal rotation    Hip external rotation    Knee flexion    Knee extension    Ankle dorsiflexion  3+  Ankle plantarflexion  3+ sitting  Ankle inversion  3+  Ankle eversion  3+   (Blank rows = not tested)    FUNCTIONAL TESTS:  TUG: 13.5 seconds Romberg x 30 seconds Semi-tandem unable   GAIT: Distance walked: 20 ft  Assistive device utilized: Single point cane. Ankle brace Level of assistance: Modified independence Comments: limited push-off LLE, foot ER,   [facility] Adult PT Treatment:                                                DATE: 05/20/24  Neuromuscular re-ed: Seated ankle rockerboard 2 x 10  Resisted ankle plantarflexion yellow 2 x 10  Resisted ankle inversion yellow 2 x 10  Resisted ankle eversion yellow d/c due to pain Seated SL calf raise on step 2 x 15 @ 5 lbs  Therapeutic Activity: Sit to stand 2 x 5  Weight  shift side to side x 10 Weight shift A/P x 10 each    [facility] Adult PT Treatment:                                                DATE: 05/07/24 Therapeutic Exercise: Sidelying ankle eversion x 10  Reviewed and updated HEP  Manual Therapy: Demo and returned demo of use of tennis ball for plantar fascia release  Neuromuscular re-ed: Seated great toe extension 2 x 10  Seated ankle rockerboard 2 x 10  Resisted ankle PF yellow band d/c due to pain Seated calf raise 5 lbs 2 x 10   Self Care: Only wear brace for ambulation   [facility] Adult PT Treatment:                                                DATE: 05/05/24  Neuromuscular re-ed: Ankle inversion isometric 2 x 10  Ankle eversion isometric 2 x 10  Resisted ankle DF red band 2 x 10   Self Care: Energy conservation  Do not sleep in brace  Continue with ice Frequent bouts of short duration walking daily    PATIENT EDUCATION:  Education details:HEP update  Person educated: Patient Education method: Explanation, Demonstration, Tactile cues, Verbal cues, and Handouts Education comprehension: verbalized understanding, returned demonstration, verbal cues required, tactile cues required, and needs  further education  HOME EXERCISE PROGRAM: Access Code: [email or web address] URL: [email or web address] Date: 05/20/2024 Prepared by: Forrestine Ike  Exercises - Long Sitting Calf Stretch with Strap  - 1 x daily - 7 x weekly - 3 sets - 30s ec  hold - Seated Toe Raise  - 1 x daily - 7 x weekly - 2 sets - 10 reps - Seated Ankle Circles  - 1 x daily - 7 x weekly - 2 sets - 10 reps - Isometric Ankle Eversion at Wall  - 1 x daily - 7 x weekly - 2 sets - 10 reps - Seated Plantar Fascia Mobilization with Small Ball  - 1 x daily - 7 x weekly - 3-5 minute  hold - Seated Great Toe Extension  - 1 x daily - 7 x weekly - 2 sets - 10 reps - Long Sitting Ankle Plantar Flexion with Resistance  - 1 x daily - 7 x weekly - 2 sets - 10 reps - Long Sitting Ankle  Inversion with Resistance  - 1 x daily - 7 x weekly - 2 sets - 10 reps - Seated Calf Raise with Weights on Thighs  - 1 x daily - 7 x weekly - 2 sets - 10 reps  ASSESSMENT:  CLINICAL IMPRESSION: Ankle plantarflexion AROM has improved compared to initial evaluation. Able to progress resisted ankle strengthening with patient able to perform resisted plantarflexion and inversion without pain. With resisted eversion she reported sharp pain about lateral ankle, so this was discontinued. With weight shifts she requires occasional Min A to maintain balance.   EVAL: Patient is a 78 y.o. female who was seen today for physical therapy evaluation and treatment for s/p Lt ankle arthroscopy Brostrom stabilization, peroneus brevis and longus debridement and tenosynovectomy on 03/12/24. She demonstrates ROM, strength, gait and balance deficits that are consistent with her recent post-operative status. She will benefit from skilled PT to address the above stated deficits in order to return to optimal function.   OBJECTIVE IMPAIRMENTS: Abnormal gait, decreased activity tolerance, decreased balance, decreased endurance, decreased knowledge of condition, decreased mobility, difficulty walking, decreased ROM, decreased strength, increased edema, impaired flexibility, impaired sensation, improper body mechanics, postural dysfunction, and pain.   ACTIVITY LIMITATIONS: carrying, lifting, bending, standing, squatting, sleeping, stairs, transfers, and locomotion level  PARTICIPATION LIMITATIONS: meal prep, cleaning, laundry, shopping, and community activity  PERSONAL FACTORS: Age, Fitness, Time since onset of injury/illness/exacerbation, and 3+ comorbidities: see PMH above are also affecting patient's functional outcome.   REHAB POTENTIAL: Good  CLINICAL DECISION MAKING: Evolving/moderate complexity  EVALUATION COMPLEXITY: Moderate   GOALS: Goals reviewed with patient? Yes  SHORT TERM GOALS: Target date:  06/09/2024   Patient will be independent and compliant with initial HEP.   Baseline: issued at eval  Goal status: INITIAL  2.  Patient will demonstrate at least 60 degrees of Lt ankle plantarflexion AROM to improve push-off during gait cycle.  Baseline: see above Goal status: INITIAL  3.  Patient will demonstrate at least 10 degrees of Lt ankle dorsiflexion AROM to improve gait mechanics.  Baseline: see above Goal status: INITIAL  4.  Patient will complete TUG in </= 12 seconds to reduce fall risk.  Baseline: see above Goal status: INITIAL    LONG TERM GOALS: Target date: 07/26/24  Patient will score >/= 60/104 on the Foot and Ankle Disability Index (MDC 7) to signify clinically meaningful improvement in functional abilities.   Baseline: see above Goal status: INITIAL  2.  Patient will maintain tandem  stance for at least 10 seconds to improve gait stability.  Baseline: see above Goal status: INITIAL  3.  Patient will be modified independent with curb/stair negotiation.  Baseline: uses cane and requires support from spouse  Goal status: INITIAL  4.  Patient will self-report ability to tolerate at least 30 minutes of standing/walking activity.  Baseline: 10 minutes  Goal status: INITIAL  5.  Patient will demonstrate at least 4/5 Lt ankle strength to improve stability when navigating on uneven terrain.  Baseline: see above  Goal status: INITIAL  6.  Patient will be independent with advanced home program to progress/maintain current level of function.  Baseline: initial HEP issued  Goal status: INITIAL   PLAN:  PT FREQUENCY: 2x/week  PT DURATION: 12 weeks  PLANNED INTERVENTIONS: 97164- PT Re-evaluation, 97750- Physical Performance Testing, 97110-Therapeutic exercises, 97530- Therapeutic activity, [id number]- Neuromuscular re-education, 97535- Self Care, 16109- Manual therapy, [id number]- Gait training, [id number]- Aquatic Therapy, 97016- Vasopneumatic device, Taping, Dry Needling,  Cryotherapy, and Moist heat  PLAN FOR NEXT SESSION: review and progress HEP; ankle AROM and strengthening as tolerated; static balance.    Minette Manders, PT, DPT, ATC 05/27/24 7:58 AM

## 2024-05-29 ENCOUNTER — Ambulatory Visit

## 2024-06-02 ENCOUNTER — Other Ambulatory Visit: Payer: Self-pay | Admitting: Family Medicine

## 2024-06-03 ENCOUNTER — Encounter: Payer: Self-pay | Admitting: Professional

## 2024-06-03 ENCOUNTER — Ambulatory Visit

## 2024-06-03 ENCOUNTER — Ambulatory Visit (INDEPENDENT_AMBULATORY_CARE_PROVIDER_SITE_OTHER): Admitting: Professional

## 2024-06-03 DIAGNOSIS — F4310 Post-traumatic stress disorder, unspecified: Secondary | ICD-10-CM | POA: Diagnosis not present

## 2024-06-03 DIAGNOSIS — R2681 Unsteadiness on feet: Secondary | ICD-10-CM | POA: Diagnosis not present

## 2024-06-03 DIAGNOSIS — R600 Localized edema: Secondary | ICD-10-CM | POA: Diagnosis not present

## 2024-06-03 DIAGNOSIS — F411 Generalized anxiety disorder: Secondary | ICD-10-CM

## 2024-06-03 DIAGNOSIS — M25572 Pain in left ankle and joints of left foot: Secondary | ICD-10-CM | POA: Diagnosis not present

## 2024-06-03 DIAGNOSIS — M6281 Muscle weakness (generalized): Secondary | ICD-10-CM

## 2024-06-03 DIAGNOSIS — R2689 Other abnormalities of gait and mobility: Secondary | ICD-10-CM

## 2024-06-03 DIAGNOSIS — R42 Dizziness and giddiness: Secondary | ICD-10-CM | POA: Diagnosis not present

## 2024-06-03 DIAGNOSIS — F331 Major depressive disorder, recurrent, moderate: Secondary | ICD-10-CM | POA: Diagnosis not present

## 2024-06-03 NOTE — Progress Notes (Signed)
 [facility] Counselor/Therapist Progress Note  Patient ID: [name], MRN: [id number],    Date: 06/03/2024  Time Spent: 54 minutes 1003-1057am   Treatment Type: Individual Therapy  Risk Assessment: Danger to Self:  No Self-injurious Behavior: No Danger to Others: No  Subjective: This session was held via video teletherapy. The patient consented to video teletherapy and was located in her home during this session. She is aware it is the responsibility of the patient to secure confidentiality on her end of the session. The provider was in a private home office for the duration of this session.    The patient arrived on time for the [name] appointment.   Issues addressed: 1-husband [name] -had a medical emergency however is doing much better -he was having issue with hemoglobin and low heart rate and they brought the paddles in to shock him -pt reports "she lost it" and started thinking about her ability to live without him -pt reports she gave it to the [facility] and she would be fine 2-physical -pt over-extended herself due to travel, husband's bff's funeral, and husband's medical emergency -pt decided to push through and perform her activities -went to church and "Sunday School and out to dinner with friends -kept appointment today with therapist because it helps her -pt admits that she thinks it is because or her age and the amount of things she has done and gone through in the past two weeks 3-grief -[name]'s bff Charlie died -the day of Charlie's burial she found that Marsha's brother Ricky had died -a year ago her closest cousin Marsha died and she was not able to attend her funeral because at the time she lived in [place] and the funeral was in [place] -Marsha kept her pt up-to-date on what was happening in the family -she became closer friends with  Marsha's brother Ricky after her death 4-spirituality -impacts on her life -how to not impose her values -check  herself  Treatment Plan Problems: anxiety, depression, family conflict Goals: Alleviate depressive symptoms Recognize, accept, and cope with depressive feelings Develop healthy thinking patterns Develop healthy interpersonal relationships (particularly younger daughter) Reduce overall frequency, intensity, and duration of anxiety Stabilize anxiety level while increasing ability to function Enhance ability to effectively cope with full variety of stressors Learn and implement coping skills that result in a reduction of anxiety   Objectives target date for all objectives is 09/26/2024: Verbalize an understanding of the cognitive, physiological, and behavioral components of anxiety    60 Learning and implement calming skills to reduce overall anxiety         60 Verbalize an understanding of the role that cognitive biases play in excessive irrational worry and persistent anxiety symptoms 70 Identify, challenge, and replace biased fearful self-talk  60 Learn and implement problem solving strategies   70 Identify and engage in pleasant activities    80 Learning and implement personal and interpersonal skills to reduce anxiety and improve interpersonal relationships   60 Learn to accept limitations in life and commit to tolerating, rather than avoiding, unpleasant emotions while accomplishing meaningful goal 60 Identify major life conflicts from the past and present that form the basis for present anxiety      70"  Maintain involvement in work, family, and social activities 16 Reestablish a consistent sleep-wake cycle   70 Verbalize an accurate understanding of depression  60 Identify and replace thoughts that support depression  59 Learn and implement behavioral strategies   60 Verbalize an understanding and resolution  of current interpersonal problems       55 Learn and implement decision making skills   2 Learn and implement conflict resolution skills to resolve interpersonal  problems       60 Verbalize an understanding of healthy and unhealthy emotions verbalize insight into how past relationships may be influence current experiences with depression     60 Use mindfulness and acceptance strategies and increase value based behavior        60 Increase hopeful statements about the future.    60 Interventions: Engage the patient in behavioral activation Use instruction, modeling, and role-playing to build the patient 's general social, communication, and/or conflict resolution skills Use Acceptance and Commitment Therapy to help patient  accept uncomfortable realities in order to accomplish value-consistent goals Reinforce the patient 's insight into the role of her past emotional pain and present anxiety  Support the patient in following through with work, family, and social activities Teach and implement sleep hygiene practices  Teach the patient relaxation skills Assign the patient homework Discuss examples demonstrating that unrealistic worry overestimates the probability of threats and underestimates patient's ability  Assist the patient in analyzing his or her worries Help patient understand that avoidance is reinforcing  Consistent with treatment model, discuss how change in cognitive, behavioral, and interpersonal can help patient alleviate depression CBT Behavioral activation to help the patient explore the relationship, nature of the dispute Help the patient develop new interpersonal skills and relationships Conduct Problem solving therapy Teach conflict resolution skills Use a process-experiential approach Conduct ACT  Diagnosis:Major depressive disorder, recurrent episode, moderate (HCC)  Generalized anxiety disorder  PTSD (post-traumatic stress disorder)  Plan:  -meet on Monday, June 16, 2024 at [employer].

## 2024-06-03 NOTE — Therapy (Signed)
 OUTPATIENT PHYSICAL THERAPY LOWER EXTREMITY TREATMENT   Patient Name: [name] MRN: [id number] DOB:06-05-1946, 78 y.o., female Today's Date: 06/03/2024  END OF SESSION:  PT End of Session - 06/03/24 1357     Visit Number 6    Number of Visits 25    Date for PT Re-Evaluation 07/26/24    Authorization Type [employer]    Progress Note Due on Visit 10    PT Start Time 1402    PT Stop Time 1442    PT Time Calculation (min) 40 min    Activity Tolerance Patient tolerated treatment well    Behavior During Therapy [facility] for tasks assessed/performed                  Past Medical History:  Diagnosis Date   Abdominal pain 10/26/2013   Allergic rhinitis 01/25/2016   ANA positive 07/29/2013   Anemia    Arthralgia 08/18/2013   Arthritis    Asthma, allergic 08/05/2012   Atrial flutter    past history- not current   Atypical chest pain 01/01/2015   Bilateral carpal tunnel syndrome 03/04/2015   CAD in native artery 08/25/2010   Minor CAD 2009, 2011, low risk Myoview  2013 and 2016        Cataract    bilaterally removed    Cellulitis of breast 02/05/2023   Chronic constipation    Chronic cough 01/09/2017   Chronic interstitial cystitis 02/01/2017   Chronic night sweats 01/08/2015   Common bile duct dilatation 02/13/2023   Concussion    x 3   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   DDD (degenerative disc disease), cervical 09/17/2018   Degenerative scoliosis 04/22/2018   Deviated septum 05/12/2016   Dysphagia    Dyspnea    Dysuria 03/09/2021   Elevated alkaline phosphatase level 01/25/2023   Essential hypertension 08/25/2010   External hemorrhoids 02/05/2015   Facial trauma 01/22/2023   Family history of adverse reaction to anesthesia    Family history of malignant hyperthermia    father had this   Fibrocystic breast disease 10/18/2010   Fibromyalgia    Frequency of urination    Generalized anxiety disorder 08/03/2010   GERD (gastroesophageal reflux  disease)    Headache 10/13/2013   High serum vitamin D  05/18/2021   History of chronic bronchitis    History of colonic polyp 02/25/2019   History of hiatal hernia    History of tachycardia    CONTROLLED  WITH ATENOLOL    Hyperglycemia 03/08/2021   Hyperkalemia 09/28/2022   Hyperlipidemia    Intertrigo 12/13/2022   Kidney stone 02/26/2019   Knee pain, right 04/30/2015   Left hip pain 04/30/2015   Leg pain 02/24/2011   Qualifier: Diagnosis of   By: Jalene Mayor DOAdel Holt         Leukocytosis 07/05/2022   Local reaction to hymenoptera sting 05/09/2023   Low back pain 09/08/2020   Major depressive disorder 02/05/2013   Malignant neoplasm of lower-outer quadrant of right breast of female, estrogen receptor positive 01/03/2013   Nasal turbinate hypertrophy 05/12/2016   Oral herpes 02/05/2023   OSA and COPD overlap syndrome 06/10/2020   Osteoporosis 01/2019   T score -2.2 stable/improved from prior study   Pelvic pain    Peripheral neuropathy 05/22/2014   Personal history of radiation therapy    Pneumonia    Primary insomnia 06/25/2017   Pulmonary nodules 02/12/2015   Rash and other nonspecific skin eruption 05/09/2023   Recurrent falls 05/02/2020  Recurrent sinusitis 02/25/2019   Right arm pain 07/20/2016   RLS (restless legs syndrome) 11/09/2019   RUQ pain 07/05/2022   S/P radiation therapy 11/12/12 - 12/05/12   right Breast   Sciatica 04/30/2015   Sepsis 2014   from UTI    Sjogren's disease 12/12/2021   Splenic lesion 09/02/2012   Sprain of lateral ligament of ankle joint 07/31/2023   Stage 3 chronic kidney disease    Statin intolerance 02/29/2020   Stricture and stenosis of esophagus 10/19/2011   Tongue lesion 02/05/2023   Tremor 07/05/2022   Urgency of urination    Vertigo 01/22/2023   Vitamin B12 deficiency 03/08/2021   Vocal cord cyst 05/18/2021   Past Surgical History:  Procedure Laterality Date   24 HOUR PH STUDY N/A 04/03/2016   Procedure: 24 HOUR PH STUDY;   Surgeon: Danette Duos, MD;  Location: [facility];  Service: Gastroenterology;  Laterality: N/A;   ANKLE ARTHROSCOPY WITH RECONSTRUCTION Left 03/12/2024   Procedure: ANKLE ARTHROSCOPY WITH OPEN LATERAL ANKLE LIGAMENT STABILIZATION;  Surgeon: Ali Ink, MD;  Location: [facility];  Service: Orthopedics;  Laterality: Left;   BREAST BIOPSY Right 08/23/2012   ADH   BREAST BIOPSY Right 10/11/2012   Ductal Carcinoma   BREAST EXCISIONAL BIOPSY Right 09/04/2013   benign   BREAST LUMPECTOMY Right 10/11/2012   W/ SLN BX   CARDIAC CATHETERIZATION  09-13-2007  DR [name] Null   WELL-PRESERVED LVF/  DIFFUSE SCATTERED CORONARY CALCIFACATION AND ATHEROSCLEROSIS WITHOUT OBSTRUCTION   CARDIAC CATHETERIZATION  08-04-2010  DR MCALHANY   NON-OBSTRUCTIVE CAD/  pLAD 40%/  oLAD 30%/  mLAD 30%/  pRCA 30%/  EF 60%   CARDIOVASCULAR STRESS TEST  06-18-2012  DR McALHANY   LOW RISK NUCLEAR STUDY/  SMALL FIXED AREA OF MODERATELY DECREASED UPTAKE IN ANTEROSEPTAL WALL WHICH MAY BE ARTIFACTUAL/  NO ISCHEMIA/  EF 68%   COLONOSCOPY  09/29/2010   CYSTOSCOPY     CYSTOSCOPY WITH HYDRODISTENSION AND BIOPSY N/A 03/06/2014   Procedure: CYSTOSCOPY/HYDRODISTENSION/ INSTILATION OF MARCAINE  AND PYRIDIUM ;  Surgeon: Edmund Gouge, MD;  Location: [facility];  Service: Urology;  Laterality: N/A;   CYSTOSCOPY/URETEROSCOPY/HOLMIUM LASER/STENT PLACEMENT Left 03/21/2023   Procedure: CYSTOSCOPY LEFT RETROGRADE PYELOGRAM, LEFT URETEROSCOPY, HOLMIUM LASER LITHOTRIPSYM, AND LEFT URETERAL STENT PLACEMENT;  Surgeon: Adelbert Homans, MD;  Location: [facility];  Service: Urology;  Laterality: Left;  60 MINUTES   ESOPHAGEAL MANOMETRY N/A 04/03/2016   Procedure: ESOPHAGEAL MANOMETRY (EM);  Surgeon: Danette Duos, MD;  Location: [facility];  Service: Gastroenterology;  Laterality: N/A;   EXTRACORPOREAL SHOCK WAVE LITHOTRIPSY Left 02/06/2019   Procedure: EXTRACORPOREAL SHOCK WAVE  LITHOTRIPSY (ESWL);  Surgeon: Ottelin, Mark, MD;  Location: [facility];  Service: Urology;  Laterality: Left;   NASAL SINUS SURGERY  1985   ORIF RIGHT ANKLE  FX  2006   POLYPECTOMY     REMOVAL VOCAL CORD CYST  01/2013   REPAIR OF PERONEUS BREVIS TENDON Left 03/12/2024   Procedure: REPAIR OF PERONEUS TENDON;  Surgeon: Ali Ink, MD;  Location: [facility];  Service: Orthopedics;  Laterality: Left;   RIGHT BREAST BX  08/23/2012   RIGHT HAND SURGERY  X3  LAST ONE 2009   INCLUDES  ORIF RIGHT 5TH FINGER AND REVISION TWICE   SKIN CANCER EXCISION     face,arms   TONSILLECTOMY AND ADENOIDECTOMY  AGE 34   TOTAL ABDOMINAL HYSTERECTOMY W/ BILATERAL SALPINGOOPHORECTOMY  1982   W/  APPENDECTOMY   TRANSTHORACIC ECHOCARDIOGRAM  06/24/2012   GRADE I DIASTOLIC DYSFUNCTION/  EF 55-60%/  MILD MR   UPPER GASTROINTESTINAL ENDOSCOPY     Patient Active Problem List   Diagnosis Date Noted   Pedal edema 05/20/2024   Otalgia of both ears 04/23/2024   Tinnitus of both ears 04/23/2024   Hypokalemia 12/10/2023   IDA (iron deficiency anemia) 09/12/2023   Stage 3 chronic kidney disease    Right ankle pain 07/31/2023   Common bile duct dilatation 02/13/2023   Oral herpes 02/05/2023   Elevated alkaline phosphatase level 01/25/2023   Vertigo 01/22/2023   Intertrigo 12/13/2022   Hyperkalemia 09/28/2022   Anemia 09/28/2022   Tremor 07/05/2022   RUQ pain 07/05/2022   Leukocytosis 07/05/2022   Sjogren's disease 12/12/2021   Vocal cord cyst 05/18/2021   Vitamin D  deficiency 05/18/2021   Dysuria 03/09/2021   Vitamin B12 deficiency 03/08/2021   Hyperglycemia 03/08/2021   OSA and COPD overlap syndrome 06/10/2020   Recurrent falls 05/02/2020   Statin intolerance 02/29/2020   RLS (restless legs syndrome) 11/09/2019   Recurrent sinusitis 02/25/2019   History of colonic polyp 02/25/2019   DDD (degenerative disc disease), cervical 09/17/2018   Degenerative scoliosis 04/22/2018   Primary  insomnia 06/25/2017   Chronic interstitial cystitis 02/01/2017   Deviated nasal septum 05/12/2016   Hypertrophy of nasal turbinates 05/12/2016   Dysphagia    Allergic rhinitis 01/25/2016   Dyspnea    Sciatica 04/30/2015   Bilateral carpal tunnel syndrome 03/04/2015   Hyperlipidemia 03/01/2015   Pulmonary nodules 02/12/2015   External hemorrhoids 02/05/2015   Chronic night sweats 01/08/2015   Peripheral neuropathy 05/22/2014   Headache 10/13/2013   Arthralgia 08/18/2013   ANA positive 07/29/2013   Depression with anxiety 02/05/2013   Malignant neoplasm of lower-outer quadrant of right breast of female, estrogen receptor positive 01/03/2013   Asthma, allergic 08/05/2012   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   GERD (gastroesophageal reflux disease) 06/03/2012   Stricture and stenosis of esophagus 10/19/2011   Osteoporosis 04/17/2011   Fibrocystic breast disease 10/18/2010   Essential hypertension 08/25/2010   CAD in native artery 08/25/2010   Generalized anxiety disorder 08/03/2010   Bronchitis 03/10/2010   Fibromyalgia 01/11/2010    PCP: Neda Balk, MD   REFERRING PROVIDER: Ali Ink, MD   REFERRING DIAG: [phone] (ICD-10-CM) - Peroneal tendinitis, left leg   THERAPY DIAG:  Pain in left ankle and joints of left foot  Muscle weakness (generalized)  Other abnormalities of gait and mobility  Localized edema  Rationale for Evaluation and Treatment: Rehabilitation  ONSET DATE: 03/12/24  SUBJECTIVE:   SUBJECTIVE STATEMENT: Patient reports her whole foot is hurting. Patient's spouse was in the hospital for a week, so she was in/out of the hospital with him.   PERTINENT HISTORY: S/p Lt ankle arthroscopy Brostrom stabilization, peroneus brevis and longus debridement and tenosynovectomy 03/12/24 See extensive PMH above  PAIN:  Are you having pain? Yes: NPRS scale: 6 Pain location: Lt lateral ankle, toes Pain description: "feels like in the bone" Aggravating  factors: palpation, rubbing it on the sheet (wakes every hour), walking Relieving factors: pain medication  PRECAUTIONS: Fall,progress per protocol   RED FLAGS: None   WEIGHT BEARING RESTRICTIONS: Yes LLE WBAT In ASO brace   FALLS:  Has patient fallen in last 6 months? Yes. Number of falls 2 (fell stepping up grassy hill, fell in her kitchen from turning quickly)  LIVING ENVIRONMENT: Lives with: lives with their spouse Lives in: House/apartment Stairs: Yes: Internal: 16  steps; on left going up Has following equipment at home: Single point cane  OCCUPATION: retired   PLOF: Independent  PATIENT GOALS: "I want to be able to walk again."  NEXT MD VISIT: End of June 2025   OBJECTIVE:  Note: Objective measures were completed at Evaluation unless otherwise noted.  DIAGNOSTIC FINDINGS: none on file   PATIENT SURVEYS:  FADI: 46/104; 44%  COGNITION: Overall cognitive status: Within functional limits for tasks assessed     SENSATION: See palpation  EDEMA:  Mild swelling about Lt ankle    PALPATION: Hypersensitivity to palpation about distal fibula, lateral malleolus   LOWER EXTREMITY ROM:  Active ROM Right eval Left eval 05/05/24 Left  05/20/24 Left  06/03/24 Left   Hip flexion       Hip extension       Hip abduction       Hip adduction       Hip internal rotation       Hip external rotation       Knee flexion       Knee extension       Ankle dorsiflexion  6 9  10   Ankle plantarflexion  50  65 67  Ankle inversion  15  15 17   Ankle eversion  8  8 9    (Blank rows = not tested)  LOWER EXTREMITY MMT:  MMT Right eval Left eval  Hip flexion    Hip extension    Hip abduction    Hip adduction    Hip internal rotation    Hip external rotation    Knee flexion    Knee extension    Ankle dorsiflexion  3+  Ankle plantarflexion  3+ sitting  Ankle inversion  3+  Ankle eversion  3+   (Blank rows = not tested)    FUNCTIONAL TESTS:  TUG: 13.5  seconds Romberg x 30 seconds Semi-tandem unable   GAIT: Distance walked: 20 ft  Assistive device utilized: Single point cane. Ankle brace Level of assistance: Modified independence Comments: limited push-off LLE, foot ER,  [facility] Adult PT Treatment:                                                DATE: 06/03/24 Therapeutic Exercise: Lt ankle AROM   Neuromuscular re-ed: Standing toe raises 2 x 10  Standing calf raise d/c due to lateral ankle pain Seated calf raise on block 5 lbs 2 x 10  Towel scrunch x 3 Therapeutic Activity: Weight shifts side to side 2 x 10  Weight shifts A/P 2 x 10  Mini squat with UE support x 8  Self Care: Recommended to loosen up brace when seated, only needing to lace up/tighten with walking/standing activity.   [facility] Adult PT Treatment:                                                DATE: 05/20/24  Neuromuscular re-ed: Seated ankle rockerboard 2 x 10  Resisted ankle plantarflexion yellow 2 x 10  Resisted ankle inversion yellow 2 x 10  Resisted ankle eversion yellow d/c due to pain Seated SL calf raise on step 2 x 15 @ 5 lbs  Therapeutic Activity: Sit to stand 2 x  5  Weight shift side to side x 10 Weight shift A/P x 10 each    [facility] Adult PT Treatment:                                                DATE: 05/07/24 Therapeutic Exercise: Sidelying ankle eversion x 10  Reviewed and updated HEP  Manual Therapy: Demo and returned demo of use of tennis ball for plantar fascia release  Neuromuscular re-ed: Seated great toe extension 2 x 10  Seated ankle rockerboard 2 x 10  Resisted ankle PF yellow band d/c due to pain Seated calf raise 5 lbs 2 x 10   Self Care: Only wear brace for ambulation    PATIENT EDUCATION:  Education details:HEP update; see treatment  Person educated: Patient Education method: Explanation, Demonstration, Tactile cues, Verbal cues, and Handouts Education comprehension: verbalized understanding, returned demonstration, verbal  cues required, tactile cues required, and needs further education  HOME EXERCISE PROGRAM: Access Code: [email or web address] URL: [email or web address] Date: 06/03/2024 Prepared by: Forrestine Ike  Exercises - Long Sitting Calf Stretch with Strap  - 1 x daily - 7 x weekly - 3 sets - 30s ec  hold - Seated Ankle Circles  - 1 x daily - 7 x weekly - 2 sets - 10 reps - Isometric Ankle Eversion at Wall  - 1 x daily - 7 x weekly - 2 sets - 10 reps - Seated Plantar Fascia Mobilization with Small Ball  - 1 x daily - 7 x weekly - 3-5 minute  hold - Seated Great Toe Extension  - 1 x daily - 7 x weekly - 2 sets - 10 reps - Long Sitting Ankle Plantar Flexion with Resistance  - 1 x daily - 7 x weekly - 2 sets - 10 reps - Long Sitting Ankle Inversion with Resistance  - 1 x daily - 7 x weekly - 2 sets - 10 reps - Seated Calf Raise with Weights on Thighs  - 1 x daily - 7 x weekly - 2 sets - 10 reps - Side to Side Weight Shift with Counter Support  - 1 x daily - 7 x weekly - 2 sets - 10 reps - Staggered Stance Forward Backward Weight Shift with Counter Support  - 1 x daily - 7 x weekly - 2 sets - 10 reps - Toe Raises with Counter Support  - 1 x daily - 7 x weekly - 2 sets - 10 reps - Towel Scrunches  - 1 x daily - 7 x weekly - 1 sets - 5 reps  ASSESSMENT:  CLINICAL IMPRESSION: Patient demonstrates functional and pain free Lt ankle AROM. Focused on progression of weight-bearing activity with good tolerance. She demonstrates improved stability with weight shifts without onset of ankle pain. She has proper form with mini squat, but reports low back pain so this was discontinued. Attempted standing calf raise, but this caused immediate lateral ankle pain so was discontinued. Able to complete seated calf raise without pain.   EVAL: Patient is a 78 y.o. female who was seen today for physical therapy evaluation and treatment for s/p Lt ankle arthroscopy Brostrom stabilization, peroneus brevis and longus  debridement and tenosynovectomy on 03/12/24. She demonstrates ROM, strength, gait and balance deficits that are consistent with her recent post-operative status. She will benefit from skilled PT to address the above stated  deficits in order to return to optimal function.   OBJECTIVE IMPAIRMENTS: Abnormal gait, decreased activity tolerance, decreased balance, decreased endurance, decreased knowledge of condition, decreased mobility, difficulty walking, decreased ROM, decreased strength, increased edema, impaired flexibility, impaired sensation, improper body mechanics, postural dysfunction, and pain.   ACTIVITY LIMITATIONS: carrying, lifting, bending, standing, squatting, sleeping, stairs, transfers, and locomotion level  PARTICIPATION LIMITATIONS: meal prep, cleaning, laundry, shopping, and community activity  PERSONAL FACTORS: Age, Fitness, Time since onset of injury/illness/exacerbation, and 3+ comorbidities: see PMH above are also affecting patient's functional outcome.   REHAB POTENTIAL: Good  CLINICAL DECISION MAKING: Evolving/moderate complexity  EVALUATION COMPLEXITY: Moderate   GOALS: Goals reviewed with patient? Yes  SHORT TERM GOALS: Target date: 06/09/2024   Patient will be independent and compliant with initial HEP.   Baseline: issued at eval  Goal status: MET  2.  Patient will demonstrate at least 60 degrees of Lt ankle plantarflexion AROM to improve push-off during gait cycle.  Baseline: see above Goal status: MET  3.  Patient will demonstrate at least 10 degrees of Lt ankle dorsiflexion AROM to improve gait mechanics.  Baseline: see above Goal status: MET  4.  Patient will complete TUG in </= 12 seconds to reduce fall risk.  Baseline: see above Goal status: INITIAL    LONG TERM GOALS: Target date: 07/26/24  Patient will score >/= 60/104 on the Foot and Ankle Disability Index (MDC 7) to signify clinically meaningful improvement in functional abilities.    Baseline: see above Goal status: INITIAL  2.  Patient will maintain tandem stance for at least 10 seconds to improve gait stability.  Baseline: see above Goal status: INITIAL  3.  Patient will be modified independent with curb/stair negotiation.  Baseline: uses cane and requires support from spouse  Goal status: INITIAL  4.  Patient will self-report ability to tolerate at least 30 minutes of standing/walking activity.  Baseline: 10 minutes  Goal status: INITIAL  5.  Patient will demonstrate at least 4/5 Lt ankle strength to improve stability when navigating on uneven terrain.  Baseline: see above  Goal status: INITIAL  6.  Patient will be independent with advanced home program to progress/maintain current level of function.  Baseline: initial HEP issued  Goal status: INITIAL   PLAN:  PT FREQUENCY: 2x/week  PT DURATION: 12 weeks  PLANNED INTERVENTIONS: 97164- PT Re-evaluation, 97750- Physical Performance Testing, 97110-Therapeutic exercises, 97530- Therapeutic activity, [id number]- Neuromuscular re-education, 97535- Self Care, 22025- Manual therapy, [id number]- Gait training, [id number]- Aquatic Therapy, 97016- Vasopneumatic device, Taping, Dry Needling, Cryotherapy, and Moist heat  PLAN FOR NEXT SESSION: review and progress HEP; ankle AROM and strengthening as tolerated; static balance.    Gabriela Giannelli, PT, DPT, ATC 06/03/24 2:42 PM

## 2024-06-05 ENCOUNTER — Encounter: Payer: Self-pay | Admitting: Gastroenterology

## 2024-06-05 ENCOUNTER — Ambulatory Visit

## 2024-06-05 NOTE — Therapy (Incomplete)
 OUTPATIENT PHYSICAL THERAPY LOWER EXTREMITY TREATMENT   Patient Name: [name] MRN: [id number] DOB:03-02-1946, 78 y.o., female Today's Date: 06/05/2024  END OF SESSION:         Past Medical History:  Diagnosis Date   Abdominal pain 10/26/2013   Allergic rhinitis 01/25/2016   ANA positive 07/29/2013   Anemia    Arthralgia 08/18/2013   Arthritis    Asthma, allergic 08/05/2012   Atrial flutter    past history- not current   Atypical chest pain 01/01/2015   Bilateral carpal tunnel syndrome 03/04/2015   CAD in native artery 08/25/2010   Minor CAD 2009, 2011, low risk Myoview  2013 and 2016        Cataract    bilaterally removed    Cellulitis of breast 02/05/2023   Chronic constipation    Chronic cough 01/09/2017   Chronic interstitial cystitis 02/01/2017   Chronic night sweats 01/08/2015   Common bile duct dilatation 02/13/2023   Concussion    x 3   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   DDD (degenerative disc disease), cervical 09/17/2018   Degenerative scoliosis 04/22/2018   Deviated septum 05/12/2016   Dysphagia    Dyspnea    Dysuria 03/09/2021   Elevated alkaline phosphatase level 01/25/2023   Essential hypertension 08/25/2010   External hemorrhoids 02/05/2015   Facial trauma 01/22/2023   Family history of adverse reaction to anesthesia    Family history of malignant hyperthermia    father had this   Fibrocystic breast disease 10/18/2010   Fibromyalgia    Frequency of urination    Generalized anxiety disorder 08/03/2010   GERD (gastroesophageal reflux disease)    Headache 10/13/2013   High serum vitamin D  05/18/2021   History of chronic bronchitis    History of colonic polyp 02/25/2019   History of hiatal hernia    History of tachycardia    CONTROLLED  WITH ATENOLOL    Hyperglycemia 03/08/2021   Hyperkalemia 09/28/2022   Hyperlipidemia    Intertrigo 12/13/2022   Kidney stone 02/26/2019   Knee pain, right 04/30/2015   Left hip pain 04/30/2015    Leg pain 02/24/2011   Qualifier: Diagnosis of   By: Jalene Mayor DOAdel Holt         Leukocytosis 07/05/2022   Local reaction to hymenoptera sting 05/09/2023   Low back pain 09/08/2020   Major depressive disorder 02/05/2013   Malignant neoplasm of lower-outer quadrant of right breast of female, estrogen receptor positive 01/03/2013   Nasal turbinate hypertrophy 05/12/2016   Oral herpes 02/05/2023   OSA and COPD overlap syndrome 06/10/2020   Osteoporosis 01/2019   T score -2.2 stable/improved from prior study   Pelvic pain    Peripheral neuropathy 05/22/2014   Personal history of radiation therapy    Pneumonia    Primary insomnia 06/25/2017   Pulmonary nodules 02/12/2015   Rash and other nonspecific skin eruption 05/09/2023   Recurrent falls 05/02/2020   Recurrent sinusitis 02/25/2019   Right arm pain 07/20/2016   RLS (restless legs syndrome) 11/09/2019   RUQ pain 07/05/2022   S/P radiation therapy 11/12/12 - 12/05/12   right Breast   Sciatica 04/30/2015   Sepsis 2014   from UTI    Sjogren's disease 12/12/2021   Splenic lesion 09/02/2012   Sprain of lateral ligament of ankle joint 07/31/2023   Stage 3 chronic kidney disease    Statin intolerance 02/29/2020   Stricture and stenosis of esophagus 10/19/2011   Tongue lesion 02/05/2023   Tremor 07/05/2022  Urgency of urination    Vertigo 01/22/2023   Vitamin B12 deficiency 03/08/2021   Vocal cord cyst 05/18/2021   Past Surgical History:  Procedure Laterality Date   24 HOUR PH STUDY N/A 04/03/2016   Procedure: 24 HOUR PH STUDY;  Surgeon: Danette Duos, MD;  Location: [facility];  Service: Gastroenterology;  Laterality: N/A;   ANKLE ARTHROSCOPY WITH RECONSTRUCTION Left 03/12/2024   Procedure: ANKLE ARTHROSCOPY WITH OPEN LATERAL ANKLE LIGAMENT STABILIZATION;  Surgeon: Ali Ink, MD;  Location: [facility];  Service: Orthopedics;  Laterality: Left;   BREAST BIOPSY Right 08/23/2012   ADH   BREAST  BIOPSY Right 10/11/2012   Ductal Carcinoma   BREAST EXCISIONAL BIOPSY Right 09/04/2013   benign   BREAST LUMPECTOMY Right 10/11/2012   W/ SLN BX   CARDIAC CATHETERIZATION  09-13-2007  DR [name] Null   WELL-PRESERVED LVF/  DIFFUSE SCATTERED CORONARY CALCIFACATION AND ATHEROSCLEROSIS WITHOUT OBSTRUCTION   CARDIAC CATHETERIZATION  08-04-2010  DR MCALHANY   NON-OBSTRUCTIVE CAD/  pLAD 40%/  oLAD 30%/  mLAD 30%/  pRCA 30%/  EF 60%   CARDIOVASCULAR STRESS TEST  06-18-2012  DR McALHANY   LOW RISK NUCLEAR STUDY/  SMALL FIXED AREA OF MODERATELY DECREASED UPTAKE IN ANTEROSEPTAL WALL WHICH MAY BE ARTIFACTUAL/  NO ISCHEMIA/  EF 68%   COLONOSCOPY  09/29/2010   CYSTOSCOPY     CYSTOSCOPY WITH HYDRODISTENSION AND BIOPSY N/A 03/06/2014   Procedure: CYSTOSCOPY/HYDRODISTENSION/ INSTILATION OF MARCAINE  AND PYRIDIUM ;  Surgeon: Edmund Gouge, MD;  Location: [facility];  Service: Urology;  Laterality: N/A;   CYSTOSCOPY/URETEROSCOPY/HOLMIUM LASER/STENT PLACEMENT Left 03/21/2023   Procedure: CYSTOSCOPY LEFT RETROGRADE PYELOGRAM, LEFT URETEROSCOPY, HOLMIUM LASER LITHOTRIPSYM, AND LEFT URETERAL STENT PLACEMENT;  Surgeon: Adelbert Homans, MD;  Location: [facility];  Service: Urology;  Laterality: Left;  60 MINUTES   ESOPHAGEAL MANOMETRY N/A 04/03/2016   Procedure: ESOPHAGEAL MANOMETRY (EM);  Surgeon: Danette Duos, MD;  Location: [facility];  Service: Gastroenterology;  Laterality: N/A;   EXTRACORPOREAL SHOCK WAVE LITHOTRIPSY Left 02/06/2019   Procedure: EXTRACORPOREAL SHOCK WAVE LITHOTRIPSY (ESWL);  Surgeon: Ottelin, Mark, MD;  Location: [facility];  Service: Urology;  Laterality: Left;   NASAL SINUS SURGERY  1985   ORIF RIGHT ANKLE  FX  2006   POLYPECTOMY     REMOVAL VOCAL CORD CYST  01/2013   REPAIR OF PERONEUS BREVIS TENDON Left 03/12/2024   Procedure: REPAIR OF PERONEUS TENDON;  Surgeon: Ali Ink, MD;  Location: [facility];  Service: Orthopedics;   Laterality: Left;   RIGHT BREAST BX  08/23/2012   RIGHT HAND SURGERY  X3  LAST ONE 2009   INCLUDES  ORIF RIGHT 5TH FINGER AND REVISION TWICE   SKIN CANCER EXCISION     face,arms   TONSILLECTOMY AND ADENOIDECTOMY  AGE 84   TOTAL ABDOMINAL HYSTERECTOMY W/ BILATERAL SALPINGOOPHORECTOMY  1982   W/  APPENDECTOMY   TRANSTHORACIC ECHOCARDIOGRAM  06/24/2012   GRADE I DIASTOLIC DYSFUNCTION/  EF 55-60%/  MILD MR   UPPER GASTROINTESTINAL ENDOSCOPY     Patient Active Problem List   Diagnosis Date Noted   Pedal edema 05/20/2024   Otalgia of both ears 04/23/2024   Tinnitus of both ears 04/23/2024   Hypokalemia 12/10/2023   IDA (iron deficiency anemia) 09/12/2023   Stage 3 chronic kidney disease    Right ankle pain 07/31/2023   Common bile duct dilatation 02/13/2023   Oral herpes 02/05/2023   Elevated alkaline phosphatase level 01/25/2023   Vertigo 01/22/2023  Intertrigo 12/13/2022   Hyperkalemia 09/28/2022   Anemia 09/28/2022   Tremor 07/05/2022   RUQ pain 07/05/2022   Leukocytosis 07/05/2022   Sjogren's disease 12/12/2021   Vocal cord cyst 05/18/2021   Vitamin D  deficiency 05/18/2021   Dysuria 03/09/2021   Vitamin B12 deficiency 03/08/2021   Hyperglycemia 03/08/2021   OSA and COPD overlap syndrome 06/10/2020   Recurrent falls 05/02/2020   Statin intolerance 02/29/2020   RLS (restless legs syndrome) 11/09/2019   Recurrent sinusitis 02/25/2019   History of colonic polyp 02/25/2019   DDD (degenerative disc disease), cervical 09/17/2018   Degenerative scoliosis 04/22/2018   Primary insomnia 06/25/2017   Chronic interstitial cystitis 02/01/2017   Deviated nasal septum 05/12/2016   Hypertrophy of nasal turbinates 05/12/2016   Dysphagia    Allergic rhinitis 01/25/2016   Dyspnea    Sciatica 04/30/2015   Bilateral carpal tunnel syndrome 03/04/2015   Hyperlipidemia 03/01/2015   Pulmonary nodules 02/12/2015   External hemorrhoids 02/05/2015   Chronic night sweats 01/08/2015    Peripheral neuropathy 05/22/2014   Headache 10/13/2013   Arthralgia 08/18/2013   ANA positive 07/29/2013   Depression with anxiety 02/05/2013   Malignant neoplasm of lower-outer quadrant of right breast of female, estrogen receptor positive 01/03/2013   Asthma, allergic 08/05/2012   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   GERD (gastroesophageal reflux disease) 06/03/2012   Stricture and stenosis of esophagus 10/19/2011   Osteoporosis 04/17/2011   Fibrocystic breast disease 10/18/2010   Essential hypertension 08/25/2010   CAD in native artery 08/25/2010   Generalized anxiety disorder 08/03/2010   Bronchitis 03/10/2010   Fibromyalgia 01/11/2010    PCP: Neda Balk, MD   REFERRING PROVIDER: Ali Ink, MD   REFERRING DIAG: [phone] (ICD-10-CM) - Peroneal tendinitis, left leg   THERAPY DIAG:  No diagnosis found.  Rationale for Evaluation and Treatment: Rehabilitation  ONSET DATE: 03/12/24  SUBJECTIVE:   SUBJECTIVE STATEMENT: Patient reports her whole foot is hurting. Patient's spouse was in the hospital for a week, so she was in/out of the hospital with him.   PERTINENT HISTORY: S/p Lt ankle arthroscopy Brostrom stabilization, peroneus brevis and longus debridement and tenosynovectomy 03/12/24 See extensive PMH above  PAIN:  Are you having pain? Yes: NPRS scale: 6 Pain location: Lt lateral ankle, toes Pain description: feels like in the bone Aggravating factors: palpation, rubbing it on the sheet (wakes every hour), walking Relieving factors: pain medication  PRECAUTIONS: Fall,progress per protocol   RED FLAGS: None   WEIGHT BEARING RESTRICTIONS: Yes LLE WBAT In ASO brace   FALLS:  Has patient fallen in last 6 months? Yes. Number of falls 2 (fell stepping up grassy hill, fell in her kitchen from turning quickly)  LIVING ENVIRONMENT: Lives with: lives with their spouse Lives in: House/apartment Stairs: Yes: Internal: 16 steps; on left going up Has  following equipment at home: Single point cane  OCCUPATION: retired   PLOF: Independent  PATIENT GOALS: I want to be able to walk again.  NEXT MD VISIT: End of June 2025   OBJECTIVE:  Note: Objective measures were completed at Evaluation unless otherwise noted.  DIAGNOSTIC FINDINGS: none on file   PATIENT SURVEYS:  FADI: 46/104; 44%  COGNITION: Overall cognitive status: Within functional limits for tasks assessed     SENSATION: See palpation  EDEMA:  Mild swelling about Lt ankle    PALPATION: Hypersensitivity to palpation about distal fibula, lateral malleolus   LOWER EXTREMITY ROM:  Active ROM Right eval Left eval 05/05/24 Left  05/20/24 Left  06/03/24 Left   Hip flexion       Hip extension       Hip abduction       Hip adduction       Hip internal rotation       Hip external rotation       Knee flexion       Knee extension       Ankle dorsiflexion  6 9  10   Ankle plantarflexion  50  65 67  Ankle inversion  15  15 17   Ankle eversion  8  8 9    (Blank rows = not tested)  LOWER EXTREMITY MMT:  MMT Right eval Left eval  Hip flexion    Hip extension    Hip abduction    Hip adduction    Hip internal rotation    Hip external rotation    Knee flexion    Knee extension    Ankle dorsiflexion  3+  Ankle plantarflexion  3+ sitting  Ankle inversion  3+  Ankle eversion  3+   (Blank rows = not tested)    FUNCTIONAL TESTS:  TUG: 13.5 seconds Romberg x 30 seconds Semi-tandem unable   GAIT: Distance walked: 20 ft  Assistive device utilized: Single point cane. Ankle brace Level of assistance: Modified independence Comments: limited push-off LLE, foot ER,  [facility] Adult PT Treatment:                                                DATE: 06/03/24 Therapeutic Exercise: Lt ankle AROM   Neuromuscular re-ed: Standing toe raises 2 x 10  Standing calf raise d/c due to lateral ankle pain Seated calf raise on block 5 lbs 2 x 10  Towel scrunch x 3 Therapeutic  Activity: Weight shifts side to side 2 x 10  Weight shifts A/P 2 x 10  Mini squat with UE support x 8  Self Care: Recommended to loosen up brace when seated, only needing to lace up/tighten with walking/standing activity.   [facility] Adult PT Treatment:                                                DATE: 05/20/24  Neuromuscular re-ed: Seated ankle rockerboard 2 x 10  Resisted ankle plantarflexion yellow 2 x 10  Resisted ankle inversion yellow 2 x 10  Resisted ankle eversion yellow d/c due to pain Seated SL calf raise on step 2 x 15 @ 5 lbs  Therapeutic Activity: Sit to stand 2 x 5  Weight shift side to side x 10 Weight shift A/P x 10 each    [facility] Adult PT Treatment:                                                DATE: 05/07/24 Therapeutic Exercise: Sidelying ankle eversion x 10  Reviewed and updated HEP  Manual Therapy: Demo and returned demo of use of tennis ball for plantar fascia release  Neuromuscular re-ed: Seated great toe extension 2 x 10  Seated ankle rockerboard 2 x 10  Resisted ankle PF  yellow band d/c due to pain Seated calf raise 5 lbs 2 x 10   Self Care: Only wear brace for ambulation    PATIENT EDUCATION:  Education details:HEP update; see treatment  Person educated: Patient Education method: Explanation, Demonstration, Tactile cues, Verbal cues, and Handouts Education comprehension: verbalized understanding, returned demonstration, verbal cues required, tactile cues required, and needs further education  HOME EXERCISE PROGRAM: Access Code: [email or web address] URL: [email or web address] Date: 06/03/2024 Prepared by: Forrestine Ike  Exercises - Long Sitting Calf Stretch with Strap  - 1 x daily - 7 x weekly - 3 sets - 30s ec  hold - Seated Ankle Circles  - 1 x daily - 7 x weekly - 2 sets - 10 reps - Isometric Ankle Eversion at Wall  - 1 x daily - 7 x weekly - 2 sets - 10 reps - Seated Plantar Fascia Mobilization with Small Ball  - 1 x daily - 7 x weekly -  3-5 minute  hold - Seated Great Toe Extension  - 1 x daily - 7 x weekly - 2 sets - 10 reps - Long Sitting Ankle Plantar Flexion with Resistance  - 1 x daily - 7 x weekly - 2 sets - 10 reps - Long Sitting Ankle Inversion with Resistance  - 1 x daily - 7 x weekly - 2 sets - 10 reps - Seated Calf Raise with Weights on Thighs  - 1 x daily - 7 x weekly - 2 sets - 10 reps - Side to Side Weight Shift with Counter Support  - 1 x daily - 7 x weekly - 2 sets - 10 reps - Staggered Stance Forward Backward Weight Shift with Counter Support  - 1 x daily - 7 x weekly - 2 sets - 10 reps - Toe Raises with Counter Support  - 1 x daily - 7 x weekly - 2 sets - 10 reps - Towel Scrunches  - 1 x daily - 7 x weekly - 1 sets - 5 reps  ASSESSMENT:  CLINICAL IMPRESSION: Patient demonstrates functional and pain free Lt ankle AROM. Focused on progression of weight-bearing activity with good tolerance. She demonstrates improved stability with weight shifts without onset of ankle pain. She has proper form with mini squat, but reports low back pain so this was discontinued. Attempted standing calf raise, but this caused immediate lateral ankle pain so was discontinued. Able to complete seated calf raise without pain.   EVAL: Patient is a 78 y.o. female who was seen today for physical therapy evaluation and treatment for s/p Lt ankle arthroscopy Brostrom stabilization, peroneus brevis and longus debridement and tenosynovectomy on 03/12/24. She demonstrates ROM, strength, gait and balance deficits that are consistent with her recent post-operative status. She will benefit from skilled PT to address the above stated deficits in order to return to optimal function.   OBJECTIVE IMPAIRMENTS: Abnormal gait, decreased activity tolerance, decreased balance, decreased endurance, decreased knowledge of condition, decreased mobility, difficulty walking, decreased ROM, decreased strength, increased edema, impaired flexibility, impaired  sensation, improper body mechanics, postural dysfunction, and pain.   ACTIVITY LIMITATIONS: carrying, lifting, bending, standing, squatting, sleeping, stairs, transfers, and locomotion level  PARTICIPATION LIMITATIONS: meal prep, cleaning, laundry, shopping, and community activity  PERSONAL FACTORS: Age, Fitness, Time since onset of injury/illness/exacerbation, and 3+ comorbidities: see PMH above are also affecting patient's functional outcome.   REHAB POTENTIAL: Good  CLINICAL DECISION MAKING: Evolving/moderate complexity  EVALUATION COMPLEXITY: Moderate   GOALS: Goals reviewed with patient? Yes  SHORT TERM  GOALS: Target date: 06/09/2024   Patient will be independent and compliant with initial HEP.   Baseline: issued at eval  Goal status: MET  2.  Patient will demonstrate at least 60 degrees of Lt ankle plantarflexion AROM to improve push-off during gait cycle.  Baseline: see above Goal status: MET  3.  Patient will demonstrate at least 10 degrees of Lt ankle dorsiflexion AROM to improve gait mechanics.  Baseline: see above Goal status: MET  4.  Patient will complete TUG in </= 12 seconds to reduce fall risk.  Baseline: see above Goal status: INITIAL    LONG TERM GOALS: Target date: 07/26/24  Patient will score >/= 60/104 on the Foot and Ankle Disability Index (MDC 7) to signify clinically meaningful improvement in functional abilities.   Baseline: see above Goal status: INITIAL  2.  Patient will maintain tandem stance for at least 10 seconds to improve gait stability.  Baseline: see above Goal status: INITIAL  3.  Patient will be modified independent with curb/stair negotiation.  Baseline: uses cane and requires support from spouse  Goal status: INITIAL  4.  Patient will self-report ability to tolerate at least 30 minutes of standing/walking activity.  Baseline: 10 minutes  Goal status: INITIAL  5.  Patient will demonstrate at least 4/5 Lt ankle strength to  improve stability when navigating on uneven terrain.  Baseline: see above  Goal status: INITIAL  6.  Patient will be independent with advanced home program to progress/maintain current level of function.  Baseline: initial HEP issued  Goal status: INITIAL   PLAN:  PT FREQUENCY: 2x/week  PT DURATION: 12 weeks  PLANNED INTERVENTIONS: 97164- PT Re-evaluation, 97750- Physical Performance Testing, 97110-Therapeutic exercises, 97530- Therapeutic activity, [id number]- Neuromuscular re-education, 97535- Self Care, [id number]- Manual therapy, [id number]- Gait training, [id number]- Aquatic Therapy, 97016- Vasopneumatic device, Taping, Dry Needling, Cryotherapy, and Moist heat  PLAN FOR NEXT SESSION: review and progress HEP; ankle AROM and strengthening as tolerated; static balance.    Roselia Snipe, PT, DPT, ATC 06/05/24 7:58 AM

## 2024-06-09 ENCOUNTER — Ambulatory Visit

## 2024-06-09 DIAGNOSIS — M25572 Pain in left ankle and joints of left foot: Secondary | ICD-10-CM | POA: Diagnosis not present

## 2024-06-09 DIAGNOSIS — R2689 Other abnormalities of gait and mobility: Secondary | ICD-10-CM

## 2024-06-09 DIAGNOSIS — R600 Localized edema: Secondary | ICD-10-CM

## 2024-06-09 DIAGNOSIS — M6281 Muscle weakness (generalized): Secondary | ICD-10-CM

## 2024-06-09 NOTE — Therapy (Signed)
 OUTPATIENT PHYSICAL THERAPY LOWER EXTREMITY TREATMENT   Patient Name: [name] MRN: [id number] DOB:December 26, 1945, 78 y.o., female Today's Date: 06/09/2024  END OF SESSION:  PT End of Session - 06/09/24 1403     Visit Number 7    Number of Visits 25    Date for PT Re-Evaluation 07/26/24    Authorization Type [employer]    Progress Note Due on Visit 10    PT Start Time 1403    PT Stop Time 1445    PT Time Calculation (min) 42 min    Activity Tolerance Patient tolerated treatment well    Behavior During Therapy [facility] for tasks assessed/performed                Past Medical History:  Diagnosis Date   Abdominal pain 10/26/2013   Allergic rhinitis 01/25/2016   ANA positive 07/29/2013   Anemia    Arthralgia 08/18/2013   Arthritis    Asthma, allergic 08/05/2012   Atrial flutter    past history- not current   Atypical chest pain 01/01/2015   Bilateral carpal tunnel syndrome 03/04/2015   CAD in native artery 08/25/2010   Minor CAD 2009, 2011, low risk Myoview  2013 and 2016        Cataract    bilaterally removed    Cellulitis of breast 02/05/2023   Chronic constipation    Chronic cough 01/09/2017   Chronic interstitial cystitis 02/01/2017   Chronic night sweats 01/08/2015   Common bile duct dilatation 02/13/2023   Concussion    x 3   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   DDD (degenerative disc disease), cervical 09/17/2018   Degenerative scoliosis 04/22/2018   Deviated septum 05/12/2016   Dysphagia    Dyspnea    Dysuria 03/09/2021   Elevated alkaline phosphatase level 01/25/2023   Essential hypertension 08/25/2010   External hemorrhoids 02/05/2015   Facial trauma 01/22/2023   Family history of adverse reaction to anesthesia    Family history of malignant hyperthermia    father had this   Fibrocystic breast disease 10/18/2010   Fibromyalgia    Frequency of urination    Generalized anxiety disorder 08/03/2010   GERD (gastroesophageal reflux  disease)    Headache 10/13/2013   High serum vitamin D  05/18/2021   History of chronic bronchitis    History of colonic polyp 02/25/2019   History of hiatal hernia    History of tachycardia    CONTROLLED  WITH ATENOLOL    Hyperglycemia 03/08/2021   Hyperkalemia 09/28/2022   Hyperlipidemia    Intertrigo 12/13/2022   Kidney stone 02/26/2019   Knee pain, right 04/30/2015   Left hip pain 04/30/2015   Leg pain 02/24/2011   Qualifier: Diagnosis of   By: Jalene Mayor DOAdel Holt         Leukocytosis 07/05/2022   Local reaction to hymenoptera sting 05/09/2023   Low back pain 09/08/2020   Major depressive disorder 02/05/2013   Malignant neoplasm of lower-outer quadrant of right breast of female, estrogen receptor positive 01/03/2013   Nasal turbinate hypertrophy 05/12/2016   Oral herpes 02/05/2023   OSA and COPD overlap syndrome 06/10/2020   Osteoporosis 01/2019   T score -2.2 stable/improved from prior study   Pelvic pain    Peripheral neuropathy 05/22/2014   Personal history of radiation therapy    Pneumonia    Primary insomnia 06/25/2017   Pulmonary nodules 02/12/2015   Rash and other nonspecific skin eruption 05/09/2023   Recurrent falls 05/02/2020   Recurrent sinusitis  02/25/2019   Right arm pain 07/20/2016   RLS (restless legs syndrome) 11/09/2019   RUQ pain 07/05/2022   S/P radiation therapy 11/12/12 - 12/05/12   right Breast   Sciatica 04/30/2015   Sepsis 2014   from UTI    Sjogren's disease 12/12/2021   Splenic lesion 09/02/2012   Sprain of lateral ligament of ankle joint 07/31/2023   Stage 3 chronic kidney disease    Statin intolerance 02/29/2020   Stricture and stenosis of esophagus 10/19/2011   Tongue lesion 02/05/2023   Tremor 07/05/2022   Urgency of urination    Vertigo 01/22/2023   Vitamin B12 deficiency 03/08/2021   Vocal cord cyst 05/18/2021   Past Surgical History:  Procedure Laterality Date   24 HOUR PH STUDY N/A 04/03/2016   Procedure: 24 HOUR PH STUDY;   Surgeon: Danette Duos, MD;  Location: [facility];  Service: Gastroenterology;  Laterality: N/A;   ANKLE ARTHROSCOPY WITH RECONSTRUCTION Left 03/12/2024   Procedure: ANKLE ARTHROSCOPY WITH OPEN LATERAL ANKLE LIGAMENT STABILIZATION;  Surgeon: Ali Ink, MD;  Location: [facility];  Service: Orthopedics;  Laterality: Left;   BREAST BIOPSY Right 08/23/2012   ADH   BREAST BIOPSY Right 10/11/2012   Ductal Carcinoma   BREAST EXCISIONAL BIOPSY Right 09/04/2013   benign   BREAST LUMPECTOMY Right 10/11/2012   W/ SLN BX   CARDIAC CATHETERIZATION  09-13-2007  DR [name] Null   WELL-PRESERVED LVF/  DIFFUSE SCATTERED CORONARY CALCIFACATION AND ATHEROSCLEROSIS WITHOUT OBSTRUCTION   CARDIAC CATHETERIZATION  08-04-2010  DR MCALHANY   NON-OBSTRUCTIVE CAD/  pLAD 40%/  oLAD 30%/  mLAD 30%/  pRCA 30%/  EF 60%   CARDIOVASCULAR STRESS TEST  06-18-2012  DR McALHANY   LOW RISK NUCLEAR STUDY/  SMALL FIXED AREA OF MODERATELY DECREASED UPTAKE IN ANTEROSEPTAL WALL WHICH MAY BE ARTIFACTUAL/  NO ISCHEMIA/  EF 68%   COLONOSCOPY  09/29/2010   CYSTOSCOPY     CYSTOSCOPY WITH HYDRODISTENSION AND BIOPSY N/A 03/06/2014   Procedure: CYSTOSCOPY/HYDRODISTENSION/ INSTILATION OF MARCAINE  AND PYRIDIUM ;  Surgeon: Edmund Gouge, MD;  Location: [facility];  Service: Urology;  Laterality: N/A;   CYSTOSCOPY/URETEROSCOPY/HOLMIUM LASER/STENT PLACEMENT Left 03/21/2023   Procedure: CYSTOSCOPY LEFT RETROGRADE PYELOGRAM, LEFT URETEROSCOPY, HOLMIUM LASER LITHOTRIPSYM, AND LEFT URETERAL STENT PLACEMENT;  Surgeon: Adelbert Homans, MD;  Location: [facility];  Service: Urology;  Laterality: Left;  60 MINUTES   ESOPHAGEAL MANOMETRY N/A 04/03/2016   Procedure: ESOPHAGEAL MANOMETRY (EM);  Surgeon: Danette Duos, MD;  Location: [facility];  Service: Gastroenterology;  Laterality: N/A;   EXTRACORPOREAL SHOCK WAVE LITHOTRIPSY Left 02/06/2019   Procedure: EXTRACORPOREAL SHOCK WAVE  LITHOTRIPSY (ESWL);  Surgeon: Ottelin, Mark, MD;  Location: [facility];  Service: Urology;  Laterality: Left;   NASAL SINUS SURGERY  1985   ORIF RIGHT ANKLE  FX  2006   POLYPECTOMY     REMOVAL VOCAL CORD CYST  01/2013   REPAIR OF PERONEUS BREVIS TENDON Left 03/12/2024   Procedure: REPAIR OF PERONEUS TENDON;  Surgeon: Ali Ink, MD;  Location: [facility];  Service: Orthopedics;  Laterality: Left;   RIGHT BREAST BX  08/23/2012   RIGHT HAND SURGERY  X3  LAST ONE 2009   INCLUDES  ORIF RIGHT 5TH FINGER AND REVISION TWICE   SKIN CANCER EXCISION     face,arms   TONSILLECTOMY AND ADENOIDECTOMY  AGE 49   TOTAL ABDOMINAL HYSTERECTOMY W/ BILATERAL SALPINGOOPHORECTOMY  1982   W/  APPENDECTOMY   TRANSTHORACIC ECHOCARDIOGRAM  06/24/2012  GRADE I DIASTOLIC DYSFUNCTION/  EF 55-60%/  MILD MR   UPPER GASTROINTESTINAL ENDOSCOPY     Patient Active Problem List   Diagnosis Date Noted   Pedal edema 05/20/2024   Otalgia of both ears 04/23/2024   Tinnitus of both ears 04/23/2024   Hypokalemia 12/10/2023   IDA (iron deficiency anemia) 09/12/2023   Stage 3 chronic kidney disease    Right ankle pain 07/31/2023   Common bile duct dilatation 02/13/2023   Oral herpes 02/05/2023   Elevated alkaline phosphatase level 01/25/2023   Vertigo 01/22/2023   Intertrigo 12/13/2022   Hyperkalemia 09/28/2022   Anemia 09/28/2022   Tremor 07/05/2022   RUQ pain 07/05/2022   Leukocytosis 07/05/2022   Sjogren's disease 12/12/2021   Vocal cord cyst 05/18/2021   Vitamin D  deficiency 05/18/2021   Dysuria 03/09/2021   Vitamin B12 deficiency 03/08/2021   Hyperglycemia 03/08/2021   OSA and COPD overlap syndrome 06/10/2020   Recurrent falls 05/02/2020   Statin intolerance 02/29/2020   RLS (restless legs syndrome) 11/09/2019   Recurrent sinusitis 02/25/2019   History of colonic polyp 02/25/2019   DDD (degenerative disc disease), cervical 09/17/2018   Degenerative scoliosis 04/22/2018   Primary  insomnia 06/25/2017   Chronic interstitial cystitis 02/01/2017   Deviated nasal septum 05/12/2016   Hypertrophy of nasal turbinates 05/12/2016   Dysphagia    Allergic rhinitis 01/25/2016   Dyspnea    Sciatica 04/30/2015   Bilateral carpal tunnel syndrome 03/04/2015   Hyperlipidemia 03/01/2015   Pulmonary nodules 02/12/2015   External hemorrhoids 02/05/2015   Chronic night sweats 01/08/2015   Peripheral neuropathy 05/22/2014   Headache 10/13/2013   Arthralgia 08/18/2013   ANA positive 07/29/2013   Depression with anxiety 02/05/2013   Malignant neoplasm of lower-outer quadrant of right breast of female, estrogen receptor positive 01/03/2013   Asthma, allergic 08/05/2012   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   GERD (gastroesophageal reflux disease) 06/03/2012   Stricture and stenosis of esophagus 10/19/2011   Osteoporosis 04/17/2011   Fibrocystic breast disease 10/18/2010   Essential hypertension 08/25/2010   CAD in native artery 08/25/2010   Generalized anxiety disorder 08/03/2010   Bronchitis 03/10/2010   Fibromyalgia 01/11/2010    PCP: Neda Balk, MD   REFERRING PROVIDER: Ali Ink, MD   REFERRING DIAG: [phone] (ICD-10-CM) - Peroneal tendinitis, left leg   THERAPY DIAG:  Pain in left ankle and joints of left foot  Muscle weakness (generalized)  Other abnormalities of gait and mobility  Localized edema  Rationale for Evaluation and Treatment: Rehabilitation  ONSET DATE: 03/12/24  SUBJECTIVE:   SUBJECTIVE STATEMENT: Patient reports sensation of ants crawling on the top/bottom of the foot and the toes at night. Isn't having any pain about surgical site. Her back is hurting today. Is having colonoscopy on Thursday.   PERTINENT HISTORY: S/p Lt ankle arthroscopy Brostrom stabilization, peroneus brevis and longus debridement and tenosynovectomy 03/12/24 See extensive PMH above  PAIN:  Are you having pain? Yes: NPRS scale: 8 Pain location: low back  Pain  description: stabbing Aggravating factors: movement Relieving factors: rest  PRECAUTIONS: Fall,progress per protocol   RED FLAGS: None   WEIGHT BEARING RESTRICTIONS: Yes LLE WBAT In ASO brace   FALLS:  Has patient fallen in last 6 months? Yes. Number of falls 2 (fell stepping up grassy hill, fell in her kitchen from turning quickly)  LIVING ENVIRONMENT: Lives with: lives with their spouse Lives in: House/apartment Stairs: Yes: Internal: 16 steps; on left going up Has following equipment at  home: Single point cane  OCCUPATION: retired   PLOF: Independent  PATIENT GOALS: I want to be able to walk again.  NEXT MD VISIT: End of June 2025   OBJECTIVE:  Note: Objective measures were completed at Evaluation unless otherwise noted.  DIAGNOSTIC FINDINGS: none on file   PATIENT SURVEYS:  FADI: 46/104; 44%  COGNITION: Overall cognitive status: Within functional limits for tasks assessed     SENSATION: See palpation  EDEMA:  Mild swelling about Lt ankle    PALPATION: Hypersensitivity to palpation about distal fibula, lateral malleolus   LOWER EXTREMITY ROM:  Active ROM Right eval Left eval 05/05/24 Left  05/20/24 Left  06/03/24 Left   Hip flexion       Hip extension       Hip abduction       Hip adduction       Hip internal rotation       Hip external rotation       Knee flexion       Knee extension       Ankle dorsiflexion  6 9  10   Ankle plantarflexion  50  65 67  Ankle inversion  15  15 17   Ankle eversion  8  8 9    (Blank rows = not tested)  LOWER EXTREMITY MMT:  MMT Right eval Left eval  Hip flexion    Hip extension    Hip abduction    Hip adduction    Hip internal rotation    Hip external rotation    Knee flexion    Knee extension    Ankle dorsiflexion  3+  Ankle plantarflexion  3+ sitting  Ankle inversion  3+  Ankle eversion  3+   (Blank rows = not tested)    FUNCTIONAL TESTS:  TUG: 13.5 seconds Romberg x 30 seconds Semi-tandem  unable   06/09/24: TUG: 11.1 seconds   GAIT: Distance walked: 20 ft  Assistive device utilized: Single point cane. Ankle brace Level of assistance: Modified independence Comments: limited push-off LLE, foot ER,  [facility] Adult PT Treatment:                                                DATE: 06/09/24 Therapeutic Exercise: Ankle circles 2 x 10 each CW/CCW Standing calf raise x 4 d/c due to pain  Neuromuscular re-ed: Resisted eversion yellow band 2 x 10  Resisted inversion yellow band 2 x 10  Arch lift seated 2 x 10  Therapeutic Activity: TUG Step taps 4 inch 2 x 10     [facility] Adult PT Treatment:                                                DATE: 06/03/24 Therapeutic Exercise: Lt ankle AROM   Neuromuscular re-ed: Standing toe raises 2 x 10  Standing calf raise d/c due to lateral ankle pain Seated calf raise on block 5 lbs 2 x 10  Towel scrunch x 3 Therapeutic Activity: Weight shifts side to side 2 x 10  Weight shifts A/P 2 x 10  Mini squat with UE support x 8  Self Care: Recommended to loosen up brace when seated, only needing to lace up/tighten with walking/standing activity.  [facility] Adult PT Treatment:                                                DATE: 05/20/24  Neuromuscular re-ed: Seated ankle rockerboard 2 x 10  Resisted ankle plantarflexion yellow 2 x 10  Resisted ankle inversion yellow 2 x 10  Resisted ankle eversion yellow d/c due to pain Seated SL calf raise on step 2 x 15 @ 5 lbs  Therapeutic Activity: Sit to stand 2 x 5  Weight shift side to side x 10 Weight shift A/P x 10 each    PATIENT EDUCATION:  Education details:HEP review Person educated: Patient Education method: Explanation Education comprehension: verbalized understanding  HOME EXERCISE PROGRAM: Access Code: [email or web address] URL: [email or web address] Date: 06/03/2024 Prepared by: Forrestine Ike  Exercises - Long Sitting Calf Stretch with Strap  - 1 x daily - 7 x weekly - 3 sets -  30s ec  hold - Seated Ankle Circles  - 1 x daily - 7 x weekly - 2 sets - 10 reps - Isometric Ankle Eversion at Wall  - 1 x daily - 7 x weekly - 2 sets - 10 reps - Seated Plantar Fascia Mobilization with Small Ball  - 1 x daily - 7 x weekly - 3-5 minute  hold - Seated Great Toe Extension  - 1 x daily - 7 x weekly - 2 sets - 10 reps - Long Sitting Ankle Plantar Flexion with Resistance  - 1 x daily - 7 x weekly - 2 sets - 10 reps - Long Sitting Ankle Inversion with Resistance  - 1 x daily - 7 x weekly - 2 sets - 10 reps - Seated Calf Raise with Weights on Thighs  - 1 x daily - 7 x weekly - 2 sets - 10 reps - Side to Side Weight Shift with Counter Support  - 1 x daily - 7 x weekly - 2 sets - 10 reps - Staggered Stance Forward Backward Weight Shift with Counter Support  - 1 x daily - 7 x weekly - 2 sets - 10 reps - Toe Raises with Counter Support  - 1 x daily - 7 x weekly - 2 sets - 10 reps - Towel Scrunches  - 1 x daily - 7 x weekly - 1 sets - 5 reps  ASSESSMENT:  CLINICAL IMPRESSION: TUG score has improved, having met this STG. She is able to complete resisted ankle eversion today without pain, but fatigues quickly in musculature. Mild instability with step taps requiring occasional use of UE support, but overall good tolerance and no onset of ankle pain. After 4 reps of calf raises she reported lateral ankle pain, so this was discontinued.   EVAL: Patient is a 78 y.o. female who was seen today for physical therapy evaluation and treatment for s/p Lt ankle arthroscopy Brostrom stabilization, peroneus brevis and longus debridement and tenosynovectomy on 03/12/24. She demonstrates ROM, strength, gait and balance deficits that are consistent with her recent post-operative status. She will benefit from skilled PT to address the above stated deficits in order to return to optimal function.   OBJECTIVE IMPAIRMENTS: Abnormal gait, decreased activity tolerance, decreased balance, decreased endurance,  decreased knowledge of condition, decreased mobility, difficulty walking, decreased ROM, decreased strength, increased edema, impaired flexibility, impaired sensation, improper body mechanics, postural dysfunction, and pain.   ACTIVITY LIMITATIONS: carrying,  lifting, bending, standing, squatting, sleeping, stairs, transfers, and locomotion level  PARTICIPATION LIMITATIONS: meal prep, cleaning, laundry, shopping, and community activity  PERSONAL FACTORS: Age, Fitness, Time since onset of injury/illness/exacerbation, and 3+ comorbidities: see PMH above are also affecting patient's functional outcome.   REHAB POTENTIAL: Good  CLINICAL DECISION MAKING: Evolving/moderate complexity  EVALUATION COMPLEXITY: Moderate   GOALS: Goals reviewed with patient? Yes  SHORT TERM GOALS: Target date: 06/09/2024   Patient will be independent and compliant with initial HEP.   Baseline: issued at eval  Goal status: MET  2.  Patient will demonstrate at least 60 degrees of Lt ankle plantarflexion AROM to improve push-off during gait cycle.  Baseline: see above Goal status: MET  3.  Patient will demonstrate at least 10 degrees of Lt ankle dorsiflexion AROM to improve gait mechanics.  Baseline: see above Goal status: MET  4.  Patient will complete TUG in </= 12 seconds to reduce fall risk.  Baseline: see above Goal status: MET    LONG TERM GOALS: Target date: 07/26/24  Patient will score >/= 60/104 on the Foot and Ankle Disability Index (MDC 7) to signify clinically meaningful improvement in functional abilities.   Baseline: see above Goal status: INITIAL  2.  Patient will maintain tandem stance for at least 10 seconds to improve gait stability.  Baseline: see above Goal status: INITIAL  3.  Patient will be modified independent with curb/stair negotiation.  Baseline: uses cane and requires support from spouse  Goal status: INITIAL  4.  Patient will self-report ability to tolerate at least  30 minutes of standing/walking activity.  Baseline: 10 minutes  Goal status: INITIAL  5.  Patient will demonstrate at least 4/5 Lt ankle strength to improve stability when navigating on uneven terrain.  Baseline: see above  Goal status: INITIAL  6.  Patient will be independent with advanced home program to progress/maintain current level of function.  Baseline: initial HEP issued  Goal status: INITIAL   PLAN:  PT FREQUENCY: 2x/week  PT DURATION: 12 weeks  PLANNED INTERVENTIONS: 97164- PT Re-evaluation, 97750- Physical Performance Testing, 97110-Therapeutic exercises, 97530- Therapeutic activity, [id number]- Neuromuscular re-education, 97535- Self Care, [id number]- Manual therapy, [id number]- Gait training, [id number]- Aquatic Therapy, 97016- Vasopneumatic device, Taping, Dry Needling, Cryotherapy, and Moist heat  PLAN FOR NEXT SESSION: review and progress HEP; ankle AROM and strengthening as tolerated; static balance.    Amit Leece, PT, DPT, ATC 06/09/24 2:54 PM

## 2024-06-11 ENCOUNTER — Encounter

## 2024-06-12 ENCOUNTER — Ambulatory Visit: Admitting: Gastroenterology

## 2024-06-12 ENCOUNTER — Encounter: Payer: Self-pay | Admitting: Gastroenterology

## 2024-06-12 VITALS — BP 110/54 | HR 78 | Temp 98.2°F | Resp 13 | Ht 64.5 in | Wt 125.0 lb

## 2024-06-12 DIAGNOSIS — M797 Fibromyalgia: Secondary | ICD-10-CM | POA: Diagnosis not present

## 2024-06-12 DIAGNOSIS — R948 Abnormal results of function studies of other organs and systems: Secondary | ICD-10-CM

## 2024-06-12 DIAGNOSIS — D509 Iron deficiency anemia, unspecified: Secondary | ICD-10-CM

## 2024-06-12 DIAGNOSIS — I4892 Unspecified atrial flutter: Secondary | ICD-10-CM | POA: Diagnosis not present

## 2024-06-12 DIAGNOSIS — G4733 Obstructive sleep apnea (adult) (pediatric): Secondary | ICD-10-CM | POA: Diagnosis not present

## 2024-06-12 DIAGNOSIS — D123 Benign neoplasm of transverse colon: Secondary | ICD-10-CM | POA: Diagnosis not present

## 2024-06-12 DIAGNOSIS — D125 Benign neoplasm of sigmoid colon: Secondary | ICD-10-CM

## 2024-06-12 DIAGNOSIS — J45909 Unspecified asthma, uncomplicated: Secondary | ICD-10-CM | POA: Diagnosis not present

## 2024-06-12 DIAGNOSIS — K644 Residual hemorrhoidal skin tags: Secondary | ICD-10-CM | POA: Diagnosis not present

## 2024-06-12 DIAGNOSIS — K648 Other hemorrhoids: Secondary | ICD-10-CM | POA: Diagnosis not present

## 2024-06-12 DIAGNOSIS — R933 Abnormal findings on diagnostic imaging of other parts of digestive tract: Secondary | ICD-10-CM | POA: Diagnosis not present

## 2024-06-12 DIAGNOSIS — K317 Polyp of stomach and duodenum: Secondary | ICD-10-CM

## 2024-06-12 DIAGNOSIS — K6289 Other specified diseases of anus and rectum: Secondary | ICD-10-CM | POA: Diagnosis not present

## 2024-06-12 DIAGNOSIS — R1310 Dysphagia, unspecified: Secondary | ICD-10-CM

## 2024-06-12 DIAGNOSIS — J449 Chronic obstructive pulmonary disease, unspecified: Secondary | ICD-10-CM | POA: Diagnosis not present

## 2024-06-12 DIAGNOSIS — K319 Disease of stomach and duodenum, unspecified: Secondary | ICD-10-CM | POA: Diagnosis not present

## 2024-06-12 DIAGNOSIS — K449 Diaphragmatic hernia without obstruction or gangrene: Secondary | ICD-10-CM

## 2024-06-12 DIAGNOSIS — Q438 Other specified congenital malformations of intestine: Secondary | ICD-10-CM

## 2024-06-12 DIAGNOSIS — I2510 Atherosclerotic heart disease of native coronary artery without angina pectoris: Secondary | ICD-10-CM | POA: Diagnosis not present

## 2024-06-12 NOTE — Op Note (Signed)
 [facility] Patient Name: [name] Procedure Date: 06/12/2024 1:26 PM MRN: [id number] Endoscopist: Landon Pinion P. General Kenner , MD, [id number] Age: 78 Referring MD:  Date of Birth: 1946-04-22 Gender: Female Account #: [id number] Procedure:                Colonoscopy Indications:              Iron deficiency anemia, Abnormal PET scan of the GI                            tract (IC valve uptake) Medicines:                Monitored Anesthesia Care Procedure:                Pre-Anesthesia Assessment:                           - Prior to the procedure, a History and Physical                            was performed, and patient medications and                            allergies were reviewed. The patient's tolerance of                            previous anesthesia was also reviewed. The risks                            and benefits of the procedure and the sedation                            options and risks were discussed with the patient.                            All questions were answered, and informed consent                            was obtained. Prior Anticoagulants: The patient has                            taken no anticoagulant or antiplatelet agents. ASA                            Grade Assessment: III - A patient with severe                            systemic disease. After reviewing the risks and                            benefits, the patient was deemed in satisfactory                            condition to undergo the procedure.  After obtaining informed consent, the colonoscope                            was passed under direct vision. Throughout the                            procedure, the patient's blood pressure, pulse, and                            oxygen saturations were monitored continuously. The                            Olympus Scope [id number] was introduced through the                            anus and advanced to  the the terminal ileum, with                            identification of the appendiceal orifice and IC                            valve. The colonoscopy was performed without                            difficulty. The patient tolerated the procedure                            well. The quality of the bowel preparation was                            adequate. The terminal ileum, ileocecal valve,                            appendiceal orifice, and rectum were photographed. Scope In: 1:54:42 PM Scope Out: 2:15:25 PM Scope Withdrawal Time: 0 hours 11 minutes 47 seconds  Total Procedure Duration: 0 hours 20 minutes 43 seconds  Findings:                 Skin tags were found on perianal exam.                           The terminal ileum appeared normal.                           A 3 mm polyp was found in the hepatic flexure. The                            polyp was sessile. The polyp was removed with a                            cold snare. Resection and retrieval were complete.                           A 3 mm polyp was  found in the transverse colon. The                            polyp was sessile. The polyp was removed with a                            cold snare. Resection and retrieval were complete.                           A 4 mm polyp was found in the sigmoid colon. The                            polyp was sessile. The polyp was removed with a                            cold snare. Resection and retrieval were complete.                           Anal papilla(e) were hypertrophied.                           Internal hemorrhoids were found during retroflexion.                           The colon was tortous. The exam was otherwise                            without abnormality. IC valve was normal. Complications:            No immediate complications. Estimated blood loss:                            Minimal. Estimated Blood Loss:     Estimated blood loss was minimal. Impression:                - Perianal skin tags found on perianal exam.                           - The examined portion of the ileum was normal.                           - One 3 mm polyp at the hepatic flexure, removed                            with a cold snare. Resected and retrieved.                           - One 3 mm polyp in the transverse colon, removed                            with a cold snare. Resected and retrieved.                           - One 4 mm polyp  in the sigmoid colon, removed with                            a cold snare. Resected and retrieved.                           - Anal papilla(e) were hypertrophied.                           - Internal hemorrhoids.                           - The examination was otherwise normal.                           No cause for IDA on this exam. Specifically, IC                            valve also normal in regards to PET scan findings. Recommendation:           - Patient has a contact number available for                            emergencies. The signs and symptoms of potential                            delayed complications were discussed with the                            patient. Return to normal activities tomorrow.                            Written discharge instructions were provided to the                            patient.                           - Resume previous diet.                           - Continue present medications.                           - Await pathology results.                           - Monitor Hgb and iron studies on iron supplement                           - Likely no further surveillance is needed given                            the patient's age and no high risk lesions on this  exam. Landon Pinion P. Miakoda Mcmillion, MD 06/12/2024 2:24:07 PM This report has been signed electronically.

## 2024-06-12 NOTE — Progress Notes (Signed)
 Vitals-[name]  Pt's states no medical or surgical changes since previsit or office visit.

## 2024-06-12 NOTE — Patient Instructions (Signed)
 Handouts provided about hiatal hernia, hemorrhoids, stricture and polyps.  Resume previous diet.  Continue present medications.  Trial of Voquezna  10 mg/ day as previously recommended if you have not tried that yet ( in place of protonix )   Await pathology results.    Monitor Hgb and iron studies on iron supplement.  Likely no futher surveillance is needed given the patient's age and no high risk lesions on this exam.   YOU HAD AN ENDOSCOPIC PROCEDURE TODAY AT THE [facility]:   Refer to the procedure report that was given to you for any specific questions about what was found during the examination.  If the procedure report does not answer your questions, please call your gastroenterologist to clarify.  If you requested that your care partner not be given the details of your procedure findings, then the procedure report has been included in a sealed envelope for you to review at your convenience later.  YOU SHOULD EXPECT: Some feelings of bloating in the abdomen. Passage of more gas than usual.  Walking can help get rid of the air that was put into your GI tract during the procedure and reduce the bloating. If you had a lower endoscopy (such as a colonoscopy or flexible sigmoidoscopy) you may notice spotting of blood in your stool or on the toilet paper. If you underwent a bowel prep for your procedure, you may not have a normal bowel movement for a few days.  Please Note:  You might notice some irritation and congestion in your nose or some drainage.  This is from the oxygen used during your procedure.  There is no need for concern and it should clear up in a day or so.  SYMPTOMS TO REPORT IMMEDIATELY:  Following lower endoscopy (colonoscopy or flexible sigmoidoscopy):  Excessive amounts of blood in the stool  Significant tenderness or worsening of abdominal pains  Swelling of the abdomen that is new, acute  Fever of 100F or higher  Following upper endoscopy  (EGD)  Vomiting of blood or coffee ground material  New chest pain or pain under the shoulder blades  Painful or persistently difficult swallowing  New shortness of breath  Fever of 100F or higher  Black, tarry-looking stools  For urgent or emergent issues, a gastroenterologist can be reached at any hour by calling [phone]. Do not use MyChart messaging for urgent concerns.    DIET:  We do recommend a small meal at first, but then you may proceed to your regular diet.  Drink plenty of fluids but you should avoid alcoholic beverages for 24 hours.  ACTIVITY:  You should plan to take it easy for the rest of today and you should NOT DRIVE or use heavy machinery until tomorrow (because of the sedation medicines used during the test).    FOLLOW UP: Our staff will call the number listed on your records the next business day following your procedure.  We will call around 7:15- 8:00 am to check on you and address any questions or concerns that you may have regarding the information given to you following your procedure. If we do not reach you, we will leave a message.     If any biopsies were taken you will be contacted by phone or by letter within the next 1-3 weeks.  Please call us  at [phone] if you have not heard about the biopsies in 3 weeks.    SIGNATURES/CONFIDENTIALITY: You and/or your care partner have signed paperwork which will be  entered into your electronic medical record.  These signatures attest to the fact that that the information above on your After Visit Summary has been reviewed and is understood.  Full responsibility of the confidentiality of this discharge information lies with you and/or your care-partner.

## 2024-06-12 NOTE — Progress Notes (Signed)
 [facility] History and Physical   Primary Care Physician:  Neda Balk, MD   Reason for Procedure:   Iron deficiency anemia, dysphagia, abnormal PET scan  Plan:    EGD with dilation, colonoscopy     HPI: [name] is a 78 y.o. female  here for EGD and colonoscopy to evaluate issues as above. See note 04/17/24 for details. On protonix  BID for  reflux, continues to bother her, she has not tried the Voquezna  samples yet.  history of proximal esophagus stricture that has responded well to dilation. Abnormal PET with uptake at the PET scan.    . Patient denies any bowel symptoms at this time.  Otherwise feels well without any cardiopulmonary symptoms. Has ongoing dysphagia,.   I have discussed risks / benefits of anesthesia and endoscopic procedure with Audry Blinks and they wish to proceed with the exams as outlined today.    Past Medical History:  Diagnosis Date   Abdominal pain 10/26/2013   Allergic rhinitis 01/25/2016   ANA positive 07/29/2013   Anemia    Arthralgia 08/18/2013   Arthritis    Asthma, allergic 08/05/2012   Atrial flutter    past history- not current   Atypical chest pain 01/01/2015   Bilateral carpal tunnel syndrome 03/04/2015   CAD in native artery 08/25/2010   Minor CAD 2009, 2011, low risk Myoview  2013 and 2016        Cataract    bilaterally removed    Cellulitis of breast 02/05/2023   Chronic constipation    Chronic cough 01/09/2017   Chronic interstitial cystitis 02/01/2017   Chronic night sweats 01/08/2015   Common bile duct dilatation 02/13/2023   Concussion    x 3   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   DDD (degenerative disc disease), cervical 09/17/2018   Degenerative scoliosis 04/22/2018   Deviated septum 05/12/2016   Dysphagia    Dyspnea    Dysuria 03/09/2021   Elevated alkaline phosphatase level 01/25/2023   Essential hypertension 08/25/2010   External hemorrhoids 02/05/2015   Facial trauma 01/22/2023   Family  history of adverse reaction to anesthesia    Family history of malignant hyperthermia    father had this   Fibrocystic breast disease 10/18/2010   Fibromyalgia    Frequency of urination    Generalized anxiety disorder 08/03/2010   GERD (gastroesophageal reflux disease)    Headache 10/13/2013   High serum vitamin D  05/18/2021   History of chronic bronchitis    History of colonic polyp 02/25/2019   History of hiatal hernia    History of tachycardia    CONTROLLED  WITH ATENOLOL    Hyperglycemia 03/08/2021   Hyperkalemia 09/28/2022   Hyperlipidemia    Intertrigo 12/13/2022   Kidney stone 02/26/2019   Knee pain, right 04/30/2015   Left hip pain 04/30/2015   Leg pain 02/24/2011   Qualifier: Diagnosis of   By: Jalene Mayor DOAdel Holt         Leukocytosis 07/05/2022   Local reaction to hymenoptera sting 05/09/2023   Low back pain 09/08/2020   Major depressive disorder 02/05/2013   Malignant neoplasm of lower-outer quadrant of right breast of female, estrogen receptor positive 01/03/2013   Nasal turbinate hypertrophy 05/12/2016   Oral herpes 02/05/2023   OSA and COPD overlap syndrome 06/10/2020   Osteoporosis 01/2019   T score -2.2 stable/improved from prior study   Pelvic pain    Peripheral neuropathy 05/22/2014   Personal history of radiation therapy  Pneumonia    Primary insomnia 06/25/2017   Pulmonary nodules 02/12/2015   Rash and other nonspecific skin eruption 05/09/2023   Recurrent falls 05/02/2020   Recurrent sinusitis 02/25/2019   Right arm pain 07/20/2016   RLS (restless legs syndrome) 11/09/2019   RUQ pain 07/05/2022   S/P radiation therapy 11/12/12 - 12/05/12   right Breast   Sciatica 04/30/2015   Sepsis 2014   from UTI    Sjogren's disease 12/12/2021   Splenic lesion 09/02/2012   Sprain of lateral ligament of ankle joint 07/31/2023   Stage 3 chronic kidney disease    Statin intolerance 02/29/2020   Stricture and stenosis of esophagus 10/19/2011   Tongue lesion  02/05/2023   Tremor 07/05/2022   Urgency of urination    Vertigo 01/22/2023   Vitamin B12 deficiency 03/08/2021   Vocal cord cyst 05/18/2021    Past Surgical History:  Procedure Laterality Date   24 HOUR PH STUDY N/A 04/03/2016   Procedure: 24 HOUR PH STUDY;  Surgeon: Danette Duos, MD;  Location: [facility];  Service: Gastroenterology;  Laterality: N/A;   ANKLE ARTHROSCOPY WITH RECONSTRUCTION Left 03/12/2024   Procedure: ANKLE ARTHROSCOPY WITH OPEN LATERAL ANKLE LIGAMENT STABILIZATION;  Surgeon: Ali Ink, MD;  Location: [facility];  Service: Orthopedics;  Laterality: Left;   BREAST BIOPSY Right 08/23/2012   ADH   BREAST BIOPSY Right 10/11/2012   Ductal Carcinoma   BREAST EXCISIONAL BIOPSY Right 09/04/2013   benign   BREAST LUMPECTOMY Right 10/11/2012   W/ SLN BX   CARDIAC CATHETERIZATION  09-13-2007  DR [name] Null   WELL-PRESERVED LVF/  DIFFUSE SCATTERED CORONARY CALCIFACATION AND ATHEROSCLEROSIS WITHOUT OBSTRUCTION   CARDIAC CATHETERIZATION  08-04-2010  DR MCALHANY   NON-OBSTRUCTIVE CAD/  pLAD 40%/  oLAD 30%/  mLAD 30%/  pRCA 30%/  EF 60%   CARDIOVASCULAR STRESS TEST  06-18-2012  DR McALHANY   LOW RISK NUCLEAR STUDY/  SMALL FIXED AREA OF MODERATELY DECREASED UPTAKE IN ANTEROSEPTAL WALL WHICH MAY BE ARTIFACTUAL/  NO ISCHEMIA/  EF 68%   COLONOSCOPY  09/29/2010   CYSTOSCOPY     CYSTOSCOPY WITH HYDRODISTENSION AND BIOPSY N/A 03/06/2014   Procedure: CYSTOSCOPY/HYDRODISTENSION/ INSTILATION OF MARCAINE  AND PYRIDIUM ;  Surgeon: Edmund Gouge, MD;  Location: [facility] [place];  Service: Urology;  Laterality: N/A;   CYSTOSCOPY/URETEROSCOPY/HOLMIUM LASER/STENT PLACEMENT Left 03/21/2023   Procedure: CYSTOSCOPY LEFT RETROGRADE PYELOGRAM, LEFT URETEROSCOPY, HOLMIUM LASER LITHOTRIPSYM, AND LEFT URETERAL STENT PLACEMENT;  Surgeon: Adelbert Homans, MD;  Location: [facility];  Service: Urology;  Laterality: Left;  60 MINUTES   ESOPHAGEAL  MANOMETRY N/A 04/03/2016   Procedure: ESOPHAGEAL MANOMETRY (EM);  Surgeon: Danette Duos, MD;  Location: [facility];  Service: Gastroenterology;  Laterality: N/A;   EXTRACORPOREAL SHOCK WAVE LITHOTRIPSY Left 02/06/2019   Procedure: EXTRACORPOREAL SHOCK WAVE LITHOTRIPSY (ESWL);  Surgeon: Ottelin, Mark, MD;  Location: [facility];  Service: Urology;  Laterality: Left;   NASAL SINUS SURGERY  1985   ORIF RIGHT ANKLE  FX  2006   POLYPECTOMY     REMOVAL VOCAL CORD CYST  01/2013   REPAIR OF PERONEUS BREVIS TENDON Left 03/12/2024   Procedure: REPAIR OF PERONEUS TENDON;  Surgeon: Ali Ink, MD;  Location: [facility];  Service: Orthopedics;  Laterality: Left;   RIGHT BREAST BX  08/23/2012   RIGHT HAND SURGERY  X3  LAST ONE 2009   INCLUDES  ORIF RIGHT 5TH FINGER AND REVISION TWICE   SKIN CANCER EXCISION  face,arms   TONSILLECTOMY AND ADENOIDECTOMY  AGE 33   TOTAL ABDOMINAL HYSTERECTOMY W/ BILATERAL SALPINGOOPHORECTOMY  1982   W/  APPENDECTOMY   TRANSTHORACIC ECHOCARDIOGRAM  06/24/2012   GRADE I DIASTOLIC DYSFUNCTION/  EF 55-60%/  MILD MR   UPPER GASTROINTESTINAL ENDOSCOPY      Prior to Admission medications   Medication Sig Start Date End Date Taking? Authorizing Provider  albuterol  (VENTOLIN  HFA) 108 (90 Base) MCG/ACT inhaler Inhale 2 puffs into the lungs every 4 (four) hours as needed for wheezing or shortness of breath (coughing fits). 12/05/23  Yes Denson Flake, MD  amLODipine  (NORVASC ) 5 MG tablet Take 1 tablet by mouth once daily 04/23/24  Yes Avanell Leigh, MD  aspirin  EC 81 MG tablet Take 1 tablet (81 mg total) by mouth daily. 05/28/14  Yes Odie Benne, MD  atenolol  (TENORMIN ) 25 MG tablet Take 1 tablet by mouth twice daily 04/23/24  Yes Berry, Jonathan J, MD  carboxymethylcellul-glycerin (OPTIVE) 0.5-0.9 % ophthalmic solution Place 1 drop into both eyes daily as needed for dry eyes.   Yes [provider]  Cholecalciferol  (VITAMIN D3)  50 MCG (2000 UT) CAPS Take 2,000 Units by mouth daily.   Yes [provider]  cyanocobalamin  (VITAMIN B12) 1000 MCG tablet Take 1,000 mg by mouth daily with breakfast.   Yes [provider]  diclofenac Sodium (VOLTAREN) 1 % GEL Apply 2 g topically daily as needed (Back pain). 02/25/15  Yes [provider]  DULoxetine  (CYMBALTA ) 60 MG capsule Take 1 capsule (60 mg total) by mouth 2 (two) times daily. 04/14/24  Yes Minna Amass B, NP  famotidine  (PEPCID ) 20 MG tablet TAKE 1 TABLET BY MOUTH TWICE DAILY AS NEEDED FOR  HEARTBURN  OR  INDIGESTION 03/10/24  Yes Neda Balk, MD  fluticasone  (FLOVENT  HFA) 220 MCG/ACT inhaler Inhale 1-2 puffs into the lungs in the morning and at bedtime. 04/21/24  Yes Neda Balk, MD  folic acid  (FOLVITE ) 1 MG tablet Take 1 tablet (1 mg total) by mouth daily. 05/03/23  Yes Dorrene Gaucher, NP  gabapentin  (NEURONTIN ) 300 MG capsule Take 300-900 mg by mouth See admin instructions. Take 600 mg in the morning 300 mg at lunch time and 900 mg at bedtime 09/28/22  Yes Neda Balk, MD  pantoprazole  (PROTONIX ) 40 MG tablet Take 40 mg by mouth 2 (two) times daily. 10/02/23  Yes [provider]  traMADol  (ULTRAM ) 50 MG tablet Take 1 tablet by mouth 3 (three) times daily as needed. 09/13/23  Yes [provider]  traZODone  (DESYREL ) 100 MG tablet Take 1 tablet (100 mg total) by mouth at bedtime. 03/26/24  Yes Webb, Padonda B, FNP  Vitamin D , Ergocalciferol , (DRISDOL ) 1.25 MG (50000 UNIT) CAPS capsule Take 1 capsule (50,000 Units total) by mouth every 7 (seven) days. 12x Weeks 01/23/23  Yes Neda Balk, MD  acetaminophen  (TYLENOL ) 500 MG tablet Take 1,000 mg by mouth every 6 (six) hours as needed for mild pain or headache.     [provider]  EPINEPHrine  0.3 mg/0.3 mL IJ SOAJ injection Inject 0.3 mg into the muscle as needed for anaphylaxis. 05/08/23   Trudy Fusi, DO  Evolocumab  (REPATHA  SURECLICK) 140 MG/ML SOAJ Inject 140 mg into  the skin every 14 (fourteen) days. 03/18/24   Neda Balk, MD  fluticasone  (FLONASE ) 50 MCG/ACT nasal spray Place 2 sprays into both nostrils daily. 04/28/24   Neda Balk, MD  furosemide  (LASIX ) 20 MG tablet TAKE  1 TABLET BY MOUTH AS NEEDED FOR EDEMA 10/29/23   Lenise Quince, MD  hydrOXYzine  (ATARAX ) 10 MG tablet Take 0.5-2 tablets (5-20 mg total) by mouth 3 (three) times daily as needed for anxiety (not with Loratadine ). 11/27/23   Neda Balk, MD  linaclotide  (LINZESS ) 72 MCG capsule Take 1 capsule (72 mcg total) by mouth daily before breakfast. 04/17/24   Sibbie Flammia, Lendon Queen, MD  loratadine  (CLARITIN ) 10 MG tablet Take 10 mg by mouth daily as needed for rhinitis.    [provider]  nitroGLYCERIN  (NITROSTAT ) 0.4 MG SL tablet Place 1 tablet (0.4 mg total) under the tongue every 5 (five) minutes x 3 doses as needed for chest pain. 10/29/23   Neda Balk, MD  nystatin  cream (MYCOSTATIN ) Apply 1 application topically 2 (two) times daily. Patient taking differently: Apply 1 application  topically daily as needed (vaginal yeast). 05/17/21   Neda Balk, MD  polyethylene glycol (MIRALAX ) 17 g packet Take 17 g by mouth 2 (two) times daily. 05/22/23   Jessen Siegman, Lendon Queen, MD  sucralfate  (CARAFATE ) 1 GM/10ML suspension Take 10 mLs (1 g total) by mouth 4 (four) times daily -  with meals and at bedtime. 08/21/22   Estill Hemming, DO  Vonoprazan Fumarate  (VOQUEZNA ) 10 MG TABS Take 1 capsule by mouth daily. 04/17/24   Alonnah Lampkins, Lendon Queen, MD    Current Outpatient Medications  Medication Sig Dispense Refill   albuterol  (VENTOLIN  HFA) 108 (90 Base) MCG/ACT inhaler Inhale 2 puffs into the lungs every 4 (four) hours as needed for wheezing or shortness of breath (coughing fits). 18 g 1   amLODipine  (NORVASC ) 5 MG tablet Take 1 tablet by mouth once daily 90 tablet 3   aspirin  EC 81 MG tablet Take 1 tablet (81 mg total) by mouth daily. 90 tablet 3   atenolol  (TENORMIN ) 25 MG tablet  Take 1 tablet by mouth twice daily 180 tablet 3   carboxymethylcellul-glycerin (OPTIVE) 0.5-0.9 % ophthalmic solution Place 1 drop into both eyes daily as needed for dry eyes.     Cholecalciferol  (VITAMIN D3) 50 MCG (2000 UT) CAPS Take 2,000 Units by mouth daily.     cyanocobalamin  (VITAMIN B12) 1000 MCG tablet Take 1,000 mg by mouth daily with breakfast.     diclofenac Sodium (VOLTAREN) 1 % GEL Apply 2 g topically daily as needed (Back pain).     DULoxetine  (CYMBALTA ) 60 MG capsule Take 1 capsule (60 mg total) by mouth 2 (two) times daily. 180 capsule 1   famotidine  (PEPCID ) 20 MG tablet TAKE 1 TABLET BY MOUTH TWICE DAILY AS NEEDED FOR  HEARTBURN  OR  INDIGESTION 60 tablet 0   fluticasone  (FLOVENT  HFA) 220 MCG/ACT inhaler Inhale 1-2 puffs into the lungs in the morning and at bedtime. 1 each 12   folic acid  (FOLVITE ) 1 MG tablet Take 1 tablet (1 mg total) by mouth daily.     gabapentin  (NEURONTIN ) 300 MG capsule Take 300-900 mg by mouth See admin instructions. Take 600 mg in the morning 300 mg at lunch time and 900 mg at bedtime     pantoprazole  (PROTONIX ) 40 MG tablet Take 40 mg by mouth 2 (two) times daily.     traMADol  (ULTRAM ) 50 MG tablet Take 1 tablet by mouth 3 (three) times daily as needed.     traZODone  (DESYREL ) 100 MG tablet Take 1 tablet (100 mg total) by mouth at bedtime. 90 tablet 0   Vitamin D , Ergocalciferol , (DRISDOL ) 1.25 MG (  50000 UNIT) CAPS capsule Take 1 capsule (50,000 Units total) by mouth every 7 (seven) days. 12x Weeks 5 capsule 3   acetaminophen  (TYLENOL ) 500 MG tablet Take 1,000 mg by mouth every 6 (six) hours as needed for mild pain or headache.      EPINEPHrine  0.3 mg/0.3 mL IJ SOAJ injection Inject 0.3 mg into the muscle as needed for anaphylaxis. 2 each 1   Evolocumab  (REPATHA  SURECLICK) 140 MG/ML SOAJ Inject 140 mg into the skin every 14 (fourteen) days. 6 mL 0   fluticasone  (FLONASE ) 50 MCG/ACT nasal spray Place 2 sprays into both nostrils daily. 16 g 5   furosemide   (LASIX ) 20 MG tablet TAKE 1 TABLET BY MOUTH AS NEEDED FOR EDEMA 90 tablet 3   hydrOXYzine  (ATARAX ) 10 MG tablet Take 0.5-2 tablets (5-20 mg total) by mouth 3 (three) times daily as needed for anxiety (not with Loratadine ). 90 tablet 2   linaclotide  (LINZESS ) 72 MCG capsule Take 1 capsule (72 mcg total) by mouth daily before breakfast. 12 capsule    loratadine  (CLARITIN ) 10 MG tablet Take 10 mg by mouth daily as needed for rhinitis.     nitroGLYCERIN  (NITROSTAT ) 0.4 MG SL tablet Place 1 tablet (0.4 mg total) under the tongue every 5 (five) minutes x 3 doses as needed for chest pain. 25 tablet 2   nystatin  cream (MYCOSTATIN ) Apply 1 application topically 2 (two) times daily. (Patient taking differently: Apply 1 application  topically daily as needed (vaginal yeast).) 30 g 2   polyethylene glycol (MIRALAX ) 17 g packet Take 17 g by mouth 2 (two) times daily. 14 each 0   sucralfate  (CARAFATE ) 1 GM/10ML suspension Take 10 mLs (1 g total) by mouth 4 (four) times daily -  with meals and at bedtime. 420 mL 1   Vonoprazan Fumarate  (VOQUEZNA ) 10 MG TABS Take 1 capsule by mouth daily.     No current facility-administered medications for this visit.    Allergies as of 06/12/2024 - Review Complete 06/12/2024  Allergen Reaction Noted   Clindamycin hcl Shortness Of Breath and Rash 04/03/2011   Levofloxacin  Rash 12/06/2021   Other Other (See Comments) 10/28/2020   Penicillins Anaphylaxis 01/11/2010   Rosuvastatin  Anaphylaxis 02/29/2020   Baclofen  Rash 05/28/2018   Codeine Hives and Nausea Only 01/11/2010   Erythromycin Hives 10/28/2020   Haemophilus influenzae vaccines Other (See Comments) 10/28/2020   Latex Hives 08/29/2012   Lincomycin Other (See Comments) 01/28/2014   Pentazocine lactate Other (See Comments) 01/11/2010   Pneumococcal vaccine polyvalent Hives, Swelling, and Other (See Comments) 01/31/2011   Prednisone  Rash 06/16/2011   Tamoxifen  Nausea And Vomiting 03/03/2014   Doxycycline  Rash  02/05/2023   Fluzone [influenza virus vaccine] Hives 09/29/2014   Haemophilus influenzae Other (See Comments) 09/29/2014    Family History  Problem Relation Age of Onset   Rectal cancer Mother    Colon cancer Mother    Pancreatic cancer Mother    Diabetes Mother    Breast cancer Mother 44   Breast cancer Maternal Aunt        breast   Birth defects Maternal Aunt    Irritable bowel syndrome Son    Heart disease Son        CAD, MV replacement   Allergic Disorder Daughter    Diabetes Daughter    Colon cancer Father    Colon polyps Father    Diabetes Father    Stroke Father    Heart disease Father  CHF   Hyperlipidemia Father    Hypertension Father    Arthritis Father    Breast cancer Paternal Aunt    Breast cancer Paternal Aunt    Arthritis Paternal Uncle    Allergic Disorder Daughter    Heart disease Cousin        CAD, had a blood clot after stenting   Esophageal cancer Neg Hx    Stomach cancer Neg Hx     Social History   Socioeconomic History   Marital status: Married    Spouse name: Currie Douse    Number of children: 3   Years of education: 16   Highest education level: Bachelor's degree (e.g., BA, AB, BS)  Occupational History   Occupation: Retired    Comment: Charity fundraiser  Tobacco Use   Smoking status: Former    Current packs/day: 0.00    Average packs/day: 1 pack/day for 15.0 years (15.0 ttl pk-yrs)    Types: Cigarettes    Start date: 05/27/1990    Quit date: 05/27/2005    Years since quitting: 19.0    Passive exposure: Never   Smokeless tobacco: Never  Vaping Use   Vaping status: Never Used  Substance and Sexual Activity   Alcohol use: No    Alcohol/week: 0.0 standard drinks of alcohol   Drug use: No   Sexual activity: Not Currently    Partners: Male    Birth control/protection: Surgical    Comment: 1st intercourse 78 yo-Fewer than 5 partners, hysterectomy  Other Topics Concern   Not on file  Social History Narrative   Lives with husband.   Caffeine  use: 1/2 cup per day   Exercise-- 2days a week [employer],  Water  aerobics, walking       Retired Engineer, civil (consulting)   No dietary restrictions, tries to maintain a heart healthy diet   Very pleasant lady   Social Drivers of Corporate investment banker Strain: Low Risk  (03/04/2024)   Overall Financial Resource Strain (CARDIA)    Difficulty of Paying Living Expenses: Not hard at all  Food Insecurity: No Food Insecurity (03/04/2024)   Hunger Vital Sign    Worried About Running Out of Food in the Last Year: Never true    Ran Out of Food in the Last Year: Never true  Transportation Needs: No Transportation Needs (03/04/2024)   PRAPARE - Administrator, Civil Service (Medical): No    Lack of Transportation (Non-Medical): No  Physical Activity: Insufficiently Active (03/04/2024)   Exercise Vital Sign    Days of Exercise per Week: 3 days    Minutes of Exercise per Session: 20 min  Stress: No Stress Concern Present (03/04/2024)   [employer] of Occupational Health - Occupational Stress Questionnaire    Feeling of Stress : Not at all  Social Connections: Socially Integrated (03/04/2024)   Social Connection and Isolation Panel    Frequency of Communication with Friends and Family: More than three times a week    Frequency of Social Gatherings with Friends and Family: More than three times a week    Attends Religious Services: More than 4 times per year    Active Member of [employer] or Organizations: Yes    Attends Banker Meetings: More than 4 times per year    Marital Status: Married  Catering manager Violence: Not At Risk (03/04/2024)   Humiliation, Afraid, Rape, and Kick questionnaire    Fear of Current or Ex-Partner: No    Emotionally Abused: No  Physically Abused: No    Sexually Abused: No    Review of Systems: All other review of systems negative except as mentioned in the HPI.  Physical Exam: Vital signs BP 122/73 (BP Location: Right Arm, Patient Position: Sitting, Cuff  Size: Normal)   Pulse 77   Temp 98.2 F (36.8 C) (Temporal)   Ht 5' 4.5 (1.638 m)   Wt 125 lb (56.7 kg)   SpO2 97%   BMI 21.12 kg/m   General:   Alert,  Well-developed, pleasant and cooperative in NAD Lungs:  Clear throughout to auscultation.   Heart:  Regular rate and rhythm Abdomen:  Soft, nontender and nondistended.   Neuro/Psych:  Alert and cooperative. Normal mood and affect. A and O x 3  Christi Coward, MD [facility]

## 2024-06-12 NOTE — Op Note (Signed)
 [facility] Patient Name: [name] Procedure Date: 06/12/2024 1:28 PM MRN: [id number] Endoscopist: Landon Pinion P. General Ken[place] , MD, [id number] Age: 78 Referring MD:  Date of Birth: 04/30/1946 Gender: Female Account #: [id number] Procedure:                Upper GI endoscopy Indications:              Iron deficiency anemia, Dysphagia - has responded                            to dilation in the past, mild stenosis just                            inferior to UES Medicines:                Monitored Anesthesia Care Procedure:                Pre-Anesthesia Assessment:                           - Prior to the procedure, a History and Physical                            was performed, and patient medications and                            allergies were reviewed. The patient's tolerance of                            previous anesthesia was also reviewed. The risks                            and benefits of the procedure and the sedation                            options and risks were discussed with the patient.                            All questions were answered, and informed consent                            was obtained. Prior Anticoagulants: The patient has                            taken no anticoagulant or antiplatelet agents. ASA                            Grade Assessment: III - A patient with severe                            systemic disease. After reviewing the risks and                            benefits, the patient was deemed in satisfactory  condition to undergo the procedure.                           After obtaining informed consent, the endoscope was                            passed under direct vision. Throughout the                            procedure, the patient's blood pressure, pulse, and                            oxygen saturations were monitored continuously. The                            Olympus Scope SN [id number] was  introduced through the                            mouth, and advanced to the second part of duodenum.                            The upper GI endoscopy was accomplished without                            difficulty. The patient tolerated the procedure                            well. Scope In: Scope Out: Findings:                 Esophagogastric landmarks were identified: the                            Z-line was found at 38 cm, the gastroesophageal                            junction was found at 38 cm and the upper extent of                            the gastric folds was found at 40 cm from the                            incisors.                           A 2 cm hiatal hernia was present.                           The exam of the esophagus was otherwise normal.                           A guidewire was placed and the scope was withdrawn.                            Dilation was  performed in the entire esophagus with                            a Savary dilator with mild resistance at 16 mm and                            17 mm. Relook endoscopy showed mild dilation effect                            just inferior to the UES                           A few small sessile polyps were found in the                            gastric body. Biopsies were taken with a cold                            forceps for histology. Suspect benign fundic gland                            polyps.                           The exam of the stomach was otherwise normal.                           Biopsies were taken with a cold forceps for                            Helicobacter pylori testing given IDA.                           The examined duodenum was normal. Complications:            No immediate complications. Estimated blood loss:                            Minimal. Estimated Blood Loss:     Estimated blood loss was minimal. Impression:               - Esophagogastric landmarks identified.                            - 2 cm hiatal hernia.                           - Normal esophagus otherwise - dilated to 17mm with                            appropriate dilation effect in the proximal                            esophagus.                           -  A few gastric polyps. Biopsied.                           - Normal stomach otherwise - biopsies taken to rule                            out H pylori                           - Normal examined duodenum.                           No overt cause for iron deficiency on EGD, will                            await biopsy results. Recommendation:           - Patient has a contact number available for                            emergencies. The signs and symptoms of potential                            delayed complications were discussed with the                            patient. Return to normal activities tomorrow.                            Written discharge instructions were provided to the                            patient.                           - Resume previous diet.                           - Continue present medications.                           - Trial of Voquezna  10mg  / day as previously                            recommended if you have not tried that yet (in                            place of protonix )                           - Await pathology results. Landon Pinion P. Keen Ewalt, MD 06/12/2024 2:18:30 PM This report has been signed electronically.

## 2024-06-12 NOTE — Progress Notes (Signed)
 Report to PACU, RN, vss, BBS= Clear.

## 2024-06-13 ENCOUNTER — Telehealth: Payer: Self-pay

## 2024-06-13 NOTE — Telephone Encounter (Signed)
  Follow up Call-     06/12/2024   12:39 PM 06/12/2024   12:35 PM 03/22/2022    1:45 PM  Call back number  Post procedure Call Back phone  # [phone]  [phone]  Permission to leave phone message  Yes Yes     Patient questions:  Do you have a fever, pain , or abdominal swelling? No. Pain Score  0 *  Have you tolerated food without any problems? Yes.    Have you been able to return to your normal activities? Yes.    Do you have any questions about your discharge instructions: Diet   No. Medications  No. Follow up visit  No.  Do you have questions or concerns about your Care? No.  Actions: * If pain score is 4 or above: No action needed, pain <4.

## 2024-06-16 ENCOUNTER — Encounter: Payer: Self-pay | Admitting: Rehabilitative and Restorative Service Providers"

## 2024-06-16 ENCOUNTER — Encounter: Payer: Self-pay | Admitting: Professional

## 2024-06-16 ENCOUNTER — Ambulatory Visit: Admitting: Rehabilitative and Restorative Service Providers"

## 2024-06-16 ENCOUNTER — Ambulatory Visit (INDEPENDENT_AMBULATORY_CARE_PROVIDER_SITE_OTHER): Admitting: Professional

## 2024-06-16 DIAGNOSIS — F4310 Post-traumatic stress disorder, unspecified: Secondary | ICD-10-CM

## 2024-06-16 DIAGNOSIS — F331 Major depressive disorder, recurrent, moderate: Secondary | ICD-10-CM

## 2024-06-16 DIAGNOSIS — F411 Generalized anxiety disorder: Secondary | ICD-10-CM | POA: Diagnosis not present

## 2024-06-16 DIAGNOSIS — M6281 Muscle weakness (generalized): Secondary | ICD-10-CM

## 2024-06-16 DIAGNOSIS — M25572 Pain in left ankle and joints of left foot: Secondary | ICD-10-CM

## 2024-06-16 DIAGNOSIS — R2681 Unsteadiness on feet: Secondary | ICD-10-CM

## 2024-06-16 DIAGNOSIS — R2689 Other abnormalities of gait and mobility: Secondary | ICD-10-CM

## 2024-06-16 DIAGNOSIS — R42 Dizziness and giddiness: Secondary | ICD-10-CM

## 2024-06-16 NOTE — Therapy (Signed)
 OUTPATIENT PHYSICAL THERAPY LOWER EXTREMITY TREATMENT AND RE-EVALUATION   Patient Name: [name] MRN: [id number] DOB:December 15, 1946, 78 y.o., female Today's Date: 06/16/2024  END OF SESSION:  PT End of Session - 06/16/24 1155     Visit Number 8    Number of Visits 25    Date for PT Re-Evaluation 07/26/24    Authorization Type [employer]    Progress Note Due on Visit 10    PT Start Time 1154    PT Stop Time 1234    PT Time Calculation (min) 40 min    Activity Tolerance Patient tolerated treatment well    Behavior During Therapy [facility] for tasks assessed/performed          Past Medical History:  Diagnosis Date   Abdominal pain 10/26/2013   Allergic rhinitis 01/25/2016   ANA positive 07/29/2013   Anemia    Arthralgia 08/18/2013   Arthritis    Asthma, allergic 08/05/2012   Atrial flutter    past history- not current   Atypical chest pain 01/01/2015   Bilateral carpal tunnel syndrome 03/04/2015   CAD in native artery 08/25/2010   Minor CAD 2009, 2011, low risk Myoview  2013 and 2016        Cataract    bilaterally removed    Cellulitis of breast 02/05/2023   Chronic constipation    Chronic cough 01/09/2017   Chronic interstitial cystitis 02/01/2017   Chronic night sweats 01/08/2015   Common bile duct dilatation 02/13/2023   Concussion    x 3   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   DDD (degenerative disc disease), cervical 09/17/2018   Degenerative scoliosis 04/22/2018   Deviated septum 05/12/2016   Dysphagia    Dyspnea    Dysuria 03/09/2021   Elevated alkaline phosphatase level 01/25/2023   Essential hypertension 08/25/2010   External hemorrhoids 02/05/2015   Facial trauma 01/22/2023   Family history of adverse reaction to anesthesia    Family history of malignant hyperthermia    father had this   Fibrocystic breast disease 10/18/2010   Fibromyalgia    Frequency of urination    Generalized anxiety disorder 08/03/2010   GERD (gastroesophageal reflux  disease)    Headache 10/13/2013   High serum vitamin D  05/18/2021   History of chronic bronchitis    History of colonic polyp 02/25/2019   History of hiatal hernia    History of tachycardia    CONTROLLED  WITH ATENOLOL    Hyperglycemia 03/08/2021   Hyperkalemia 09/28/2022   Hyperlipidemia    Intertrigo 12/13/2022   Kidney stone 02/26/2019   Knee pain, right 04/30/2015   Left hip pain 04/30/2015   Leg pain 02/24/2011   Qualifier: Diagnosis of   By: Antonio DOJamee         Leukocytosis 07/05/2022   Local reaction to hymenoptera sting 05/09/2023   Low back pain 09/08/2020   Major depressive disorder 02/05/2013   Malignant neoplasm of lower-outer quadrant of right breast of female, estrogen receptor positive 01/03/2013   Nasal turbinate hypertrophy 05/12/2016   Oral herpes 02/05/2023   OSA and COPD overlap syndrome 06/10/2020   Osteoporosis 01/2019   T score -2.2 stable/improved from prior study   Pelvic pain    Peripheral neuropathy 05/22/2014   Personal history of radiation therapy    Pneumonia    Primary insomnia 06/25/2017   Pulmonary nodules 02/12/2015   Rash and other nonspecific skin eruption 05/09/2023   Recurrent falls 05/02/2020   Recurrent sinusitis 02/25/2019   Right  arm pain 07/20/2016   RLS (restless legs syndrome) 11/09/2019   RUQ pain 07/05/2022   S/P radiation therapy 11/12/12 - 12/05/12   right Breast   Sciatica 04/30/2015   Sepsis 2014   from UTI    Sjogren's disease 12/12/2021   Splenic lesion 09/02/2012   Sprain of lateral ligament of ankle joint 07/31/2023   Stage 3 chronic kidney disease    Statin intolerance 02/29/2020   Stricture and stenosis of esophagus 10/19/2011   Tongue lesion 02/05/2023   Tremor 07/05/2022   Urgency of urination    Vertigo 01/22/2023   Vitamin B12 deficiency 03/08/2021   Vocal cord cyst 05/18/2021   Past Surgical History:  Procedure Laterality Date   24 HOUR PH STUDY N/A 04/03/2016   Procedure: 24 HOUR PH STUDY;   Surgeon: Elspeth Deward Naval, MD;  Location: [facility];  Service: Gastroenterology;  Laterality: N/A;   ANKLE ARTHROSCOPY WITH RECONSTRUCTION Left 03/12/2024   Procedure: ANKLE ARTHROSCOPY WITH OPEN LATERAL ANKLE LIGAMENT STABILIZATION;  Surgeon: Barton Drape, MD;  Location: [facility];  Service: Orthopedics;  Laterality: Left;   BREAST BIOPSY Right 08/23/2012   ADH   BREAST BIOPSY Right 10/11/2012   Ductal Carcinoma   BREAST EXCISIONAL BIOPSY Right 09/04/2013   benign   BREAST LUMPECTOMY Right 10/11/2012   W/ SLN BX   CARDIAC CATHETERIZATION  09-13-2007  DR MORRIS   WELL-PRESERVED LVF/  DIFFUSE SCATTERED CORONARY CALCIFACATION AND ATHEROSCLEROSIS WITHOUT OBSTRUCTION   CARDIAC CATHETERIZATION  08-04-2010  DR MCALHANY   NON-OBSTRUCTIVE CAD/  pLAD 40%/  oLAD 30%/  mLAD 30%/  pRCA 30%/  EF 60%   CARDIOVASCULAR STRESS TEST  06-18-2012  DR McALHANY   LOW RISK NUCLEAR STUDY/  SMALL FIXED AREA OF MODERATELY DECREASED UPTAKE IN ANTEROSEPTAL WALL WHICH MAY BE ARTIFACTUAL/  NO ISCHEMIA/  EF 68%   COLONOSCOPY  09/29/2010   CYSTOSCOPY     CYSTOSCOPY WITH HYDRODISTENSION AND BIOPSY N/A 03/06/2014   Procedure: CYSTOSCOPY/HYDRODISTENSION/ INSTILATION OF MARCAINE  AND PYRIDIUM ;  Surgeon: Arlena LILLETTE Gal, MD;  Location: [facility];  Service: Urology;  Laterality: N/A;   CYSTOSCOPY/URETEROSCOPY/HOLMIUM LASER/STENT PLACEMENT Left 03/21/2023   Procedure: CYSTOSCOPY LEFT RETROGRADE PYELOGRAM, LEFT URETEROSCOPY, HOLMIUM LASER LITHOTRIPSYM, AND LEFT URETERAL STENT PLACEMENT;  Surgeon: Devere Lonni Righter, MD;  Location: [facility];  Service: Urology;  Laterality: Left;  60 MINUTES   ESOPHAGEAL MANOMETRY N/A 04/03/2016   Procedure: ESOPHAGEAL MANOMETRY (EM);  Surgeon: Elspeth Deward Naval, MD;  Location: [facility];  Service: Gastroenterology;  Laterality: N/A;   EXTRACORPOREAL SHOCK WAVE LITHOTRIPSY Left 02/06/2019   Procedure: EXTRACORPOREAL SHOCK WAVE  LITHOTRIPSY (ESWL);  Surgeon: Ottelin, Mark, MD;  Location: [facility];  Service: Urology;  Laterality: Left;   NASAL SINUS SURGERY  1985   ORIF RIGHT ANKLE  FX  2006   POLYPECTOMY     REMOVAL VOCAL CORD CYST  01/2013   REPAIR OF PERONEUS BREVIS TENDON Left 03/12/2024   Procedure: REPAIR OF PERONEUS TENDON;  Surgeon: Barton Drape, MD;  Location:  SUR[facility]ERY CENTER;  Service: Orthopedics;  Laterality: Left;   RIGHT BREAST BX  08/23/2012   RIGHT HAND SURGERY  X3  LAST ONE 2009   INCLUDES  ORIF RIGHT 5TH FINGER AND REVISION TWICE   SKIN CANCER EXCISION     face,arms   TONSILLECTOMY AND ADENOIDECTOMY  AGE 77   TOTAL ABDOMINAL HYSTERECTOMY W/ BILATERAL SALPINGOOPHORECTOMY  1982   W/  APPENDECTOMY   TRANSTHORACIC ECHOCARDIOGRAM  06/24/2012   GRADE I DIASTOLIC  DYSFUNCTION/  EF 55-60%/  MILD MR   UPPER GASTROINTESTINAL ENDOSCOPY     Patient Active Problem List   Diagnosis Date Noted   Pedal edema 05/20/2024   Otalgia of both ears 04/23/2024   Tinnitus of both ears 04/23/2024   Hypokalemia 12/10/2023   IDA (iron deficiency anemia) 09/12/2023   Stage 3 chronic kidney disease    Right ankle pain 07/31/2023   Common bile duct dilatation 02/13/2023   Oral herpes 02/05/2023   Elevated alkaline phosphatase level 01/25/2023   Vertigo 01/22/2023   Intertrigo 12/13/2022   Hyperkalemia 09/28/2022   Anemia 09/28/2022   Tremor 07/05/2022   RUQ pain 07/05/2022   Leukocytosis 07/05/2022   Sjogren's disease 12/12/2021   Vocal cord cyst 05/18/2021   Vitamin D  deficiency 05/18/2021   Dysuria 03/09/2021   Vitamin B12 deficiency 03/08/2021   Hyperglycemia 03/08/2021   OSA and COPD overlap syndrome 06/10/2020   Recurrent falls 05/02/2020   Statin intolerance 02/29/2020   RLS (restless legs syndrome) 11/09/2019   Recurrent sinusitis 02/25/2019   History of colonic polyp 02/25/2019   DDD (degenerative disc disease), cervical 09/17/2018   Degenerative scoliosis 04/22/2018   Primary  insomnia 06/25/2017   Chronic interstitial cystitis 02/01/2017   Deviated nasal septum 05/12/2016   Hypertrophy of nasal turbinates 05/12/2016   Dysphagia    Allergic rhinitis 01/25/2016   Dyspnea    Sciatica 04/30/2015   Bilateral carpal tunnel syndrome 03/04/2015   Hyperlipidemia 03/01/2015   Pulmonary nodules 02/12/2015   External hemorrhoids 02/05/2015   Chronic night sweats 01/08/2015   Peripheral neuropathy 05/22/2014   Headache 10/13/2013   Arthralgia 08/18/2013   ANA positive 07/29/2013   Depression with anxiety 02/05/2013   Malignant neoplasm of lower-outer quadrant of right breast of female, estrogen receptor positive 01/03/2013   Asthma, allergic 08/05/2012   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   GERD (gastroesophageal reflux disease) 06/03/2012   Stricture and stenosis of esophagus 10/19/2011   Osteoporosis 04/17/2011   Fibrocystic breast disease 10/18/2010   Essential hypertension 08/25/2010   CAD in native artery 08/25/2010   Generalized anxiety disorder 08/03/2010   Bronchitis 03/10/2010   Fibromyalgia 01/11/2010    PCP: Domenica Harlene LABOR, MD   REFERRING PROVIDER: Barton Drape, MD   REFERRING DIAG: [phone] (ICD-10-CM) - Peroneal tendinitis, left leg   THERAPY DIAG:  Pain in left ankle and joints of left foot  Muscle weakness (generalized)  Other abnormalities of gait and mobility  Unsteadiness on feet  Dizziness and giddiness  Rationale for Evaluation and Treatment: Rehabilitation  ONSET DATE: 03/12/24  SUBJECTIVE:   SUBJECTIVE STATEMENT: The patient reports she is feeling unsteadiness with head turns or when she is out of the house. She is not able to bend over because of dizziness. She describes it as a spinning sensation (with bending). She also gets dizziness with rolling in bed.   PERTINENT HISTORY: S/p Lt ankle arthroscopy Brostrom stabilization, peroneus brevis and longus debridement and tenosynovectomy 03/12/24 See extensive PMH above    PAIN:  Are you having pain? Yes: NPRS scale: 8 Pain location: low back  Pain description: stabbing Aggravating factors: movement Relieving factors: rest  PRECAUTIONS: Fall,progress per protocol   WEIGHT BEARING RESTRICTIONS: Yes LLE WBAT In ASO brace   FALLS:  Has patient fallen in last 6 months? Yes. Number of falls 2 (fell stepping up grassy hill, fell in her kitchen from turning quickly)  LIVING ENVIRONMENT: Lives with: lives with their spouse Lives in: House/apartment Stairs: Yes: Internal: 16  steps; on left going up Has following equipment at home: Single point cane  OCCUPATION: retired   PLOF: Independent  PATIENT GOALS: I want to be able to walk again.  NEXT MD VISIT: End of June 2025   OBJECTIVE:  Note: Objective measures were completed at Evaluation unless otherwise noted.  DIAGNOSTIC FINDINGS: none on file   PATIENT SURVEYS:  FADI: 46/104; 44%  COGNITION: Overall cognitive status: Within functional limits for tasks assessed     SENSATION: See palpation  EDEMA:  Mild swelling about Lt ankle    PALPATION: Hypersensitivity to palpation about distal fibula, lateral malleolus   LOWER EXTREMITY ROM: Active ROM Right eval Left eval 05/05/24 Left  05/20/24 Left  06/03/24 Left   Hip flexion       Hip extension       Hip abduction       Hip adduction       Hip internal rotation       Hip external rotation       Knee flexion       Knee extension       Ankle dorsiflexion  6 9  10   Ankle plantarflexion  50  65 67  Ankle inversion  15  15 17   Ankle eversion  8  8 9    (Blank rows = not tested)  LOWER EXTREMITY MMT: MMT Right eval Left eval  Hip flexion    Hip extension    Hip abduction    Hip adduction    Hip internal rotation    Hip external rotation    Knee flexion    Knee extension    Ankle dorsiflexion  3+  Ankle plantarflexion  3+ sitting  Ankle inversion  3+  Ankle eversion  3+   (Blank rows = not tested)  FUNCTIONAL TESTS:   TUG: 13.5 seconds Romberg x 30 seconds Semi-tandem unable   06/09/24: TUG: 11.1 seconds   GAIT: Distance walked: 20 ft  Assistive device utilized: Single point cane. Ankle brace Level of assistance: Modified independence Comments: limited push-off LLE, foot ER,    [facility] Adult PT Treatment:                                                DATE: 06/16/24 PT re-eval  VESTIBULAR ASSESSMENT: GENERAL OBSERVATION: Patient arrives with [facility] and wide base of support   SYMPTOM BEHAVIOR:  Subjective history: unsteadiness and spinning  Non-Vestibular symptoms: none  Type of dizziness: Imbalance (Disequilibrium), Spinning/Vertigo, Unsteady with head/body turns, and Lightheadedness/Faint  Frequency: daily  Duration: seconds to minutes  Aggravating factors: bending turning, rolling in bed  unable to hold her head back in the shower  Relieving factors: head stationary  Progression of symptoms: worse after recent surgery  OCULOMOTOR EXAM:  Ocular Alignment: normal  Ocular ROM: No Limitations  Spontaneous Nystagmus: absent  Gaze-Induced Nystagmus: absent  Smooth Pursuits: intact however she gets sensation of dizziness moving eyes L>R with smooth pursuits (doesn't improve with repetitions)  Saccades: intact     VESTIBULAR - OCULAR REFLEX:   Slow VOR: Comment: x 1-2 reps gets immediate sensation of out of control and sharp sensation of dizziness  VOR Cancellation: Normal, but notes neck pain and tightness  Head-Impulse Test: HIT Right: did not test due to significant reaction with slow VOR HIT Left: did not test    POSITIONAL TESTING:  Right Roll Test: provokes sensation of spinning (room moving towards the floor), but not able to view nystagmus in room light Left Roll Test: no dizziness or nystagmus Right Sidelying: trace of dizziness x 5 seconds Left Sidelying:  trace of dizziness x 5 seconds   MOTION SENSITIVITY:   Motion Sensitivity Quotient Intensity: 0 = none, 1 = Lightheaded, 2 =  Mild, 3 = Moderate, 4 = Severe, 5 = Vomiting  Intensity  1. Sitting to supine 3  2. Supine to L side 0  3. Supine to R side 3  4. Supine to sitting 3  5. L Hallpike-Dix   6. Up from L    7. R Hallpike-Dix   8. Up from R    9. Sitting, head  tipped to L knee   10. Head up from L  knee   11. Sitting, head  tipped to R knee   12. Head up from R  knee   13. Sitting head turns x5 3  14.Sitting head nods x5 2  15. In stance, 180  turn to L    16. In stance, 180  turn to R     Neuromuscular re-ed: Habituation  Seated x 5 horizontal head turns provokes 3-4/10 dizziness Seated x 5 vertical head turns provokes 2/10 dizziness Rolling x 3 reps -- symptoms improve with repetition Wilhelmena daroff x 2 reps  -- symptoms improve  Smooth pursuit Horizontal plane provokes Gaze adaptation Unable to tolerate gaze activities   [facility] Adult PT Treatment:                                                DATE: 06/09/24 Therapeutic Exercise: Ankle circles 2 x 10 each CW/CCW Standing calf raise x 4 d/c due to pain Neuromuscular re-ed: Resisted eversion yellow band 2 x 10  Resisted inversion yellow band 2 x 10  Arch lift seated 2 x 10  Therapeutic Activity: TUG Step taps 4 inch 2 x 10    [facility] Adult PT Treatment:                                                DATE: 06/03/24 Therapeutic Exercise: Lt ankle AROM  Neuromuscular re-ed: Standing toe raises 2 x 10  Standing calf raise d/c due to lateral ankle pain Seated calf raise on block 5 lbs 2 x 10  Towel scrunch x 3 Therapeutic Activity: Weight shifts side to side 2 x 10  Weight shifts A/P 2 x 10  Mini squat with UE support x 8  Self Care: Recommended to loosen up brace when seated, only needing to lace up/tighten with walking/standing activity.   [facility] Adult PT Treatment:                                                DATE: 05/20/24 Neuromuscular re-ed: Seated ankle rockerboard 2 x 10  Resisted ankle plantarflexion yellow 2 x 10   Resisted ankle inversion yellow 2 x 10  Resisted ankle eversion yellow d/c due to pain Seated SL calf raise on step 2 x 15 @ 5  lbs  Therapeutic Activity: Sit to stand 2 x 5  Weight shift side to side x 10 Weight shift A/P x 10 each    PATIENT EDUCATION:  Education details:HEP review Person educated: Patient Education method: Explanation Education comprehension: verbalized understanding  HOME EXERCISE PROGRAM: Access Code: [email or web address] URL: [email or web address] Date: 06/16/2024 Prepared by: Tawni Ferrier  Exercises - Long Sitting Calf Stretch with Strap  - 1 x daily - 7 x weekly - 3 sets - 30s ec  hold - Seated Ankle Circles  - 1 x daily - 7 x weekly - 2 sets - 10 reps - Isometric Ankle Eversion at Wall  - 1 x daily - 7 x weekly - 2 sets - 10 reps - Seated Plantar Fascia Mobilization with Small Ball  - 1 x daily - 7 x weekly - 3-5 minute  hold - Seated Great Toe Extension  - 1 x daily - 7 x weekly - 2 sets - 10 reps - Long Sitting Ankle Plantar Flexion with Resistance  - 1 x daily - 7 x weekly - 2 sets - 10 reps - Long Sitting Ankle Inversion with Resistance  - 1 x daily - 7 x weekly - 2 sets - 10 reps - Seated Calf Raise with Weights on Thighs  - 1 x daily - 7 x weekly - 2 sets - 10 reps - Side to Side Weight Shift with Counter Support  - 1 x daily - 7 x weekly - 2 sets - 10 reps - Staggered Stance Forward Backward Weight Shift with Counter Support  - 1 x daily - 7 x weekly - 2 sets - 10 reps - Toe Raises with Counter Support  - 1 x daily - 7 x weekly - 2 sets - 10 reps - Towel Scrunches  - 1 x daily - 7 x weekly - 1 sets - 5 reps - Rolling rightleft sides for vestibular habituation  - 1-2 x daily - 7 x weekly - 1 sets - 3-5 reps - Seated Right Head Turns Vestibular Habituation  - 1-2 x daily - 7 x weekly - 1 sets - 5 reps  ASSESSMENT:  CLINICAL IMPRESSION: The patient has motion sensitivity worse with horizontal head turns, horizontal roll to the R, and  sit<>supine. She describes a mild sensation of room spinning to the R however nystagmus not viewed in room light and symptoms improved significantly with repetition, so PT to initiate habituation for treatment. She cannot yet tolerate tracking L>R or gaze adaptation exercises. PT to incorporate vestibular exercises into plan of care-- see updated goals below.   EVAL: Patient is a 78 y.o. female who was seen today for physical therapy evaluation and treatment for s/p Lt ankle arthroscopy Brostrom stabilization, peroneus brevis and longus debridement and tenosynovectomy on 03/12/24. She demonstrates ROM, strength, gait and balance deficits that are consistent with her recent post-operative status. She will benefit from skilled PT to address the above stated deficits in order to return to optimal function.   OBJECTIVE IMPAIRMENTS: Abnormal gait, decreased activity tolerance, decreased balance, decreased endurance, decreased knowledge of condition, decreased mobility, difficulty walking, decreased ROM, decreased strength, increased edema, impaired flexibility, impaired sensation, improper body mechanics, postural dysfunction, and pain.   GOALS: Goals reviewed with patient? Yes  SHORT TERM GOALS: Target date: 06/09/2024  Patient will be independent and compliant with initial HEP.  Baseline: issued at eval  Goal status: MET  2.  Patient will demonstrate at least 60 degrees of Lt ankle plantarflexion AROM  to improve push-off during gait cycle.  Baseline: see above Goal status: MET  3.  Patient will demonstrate at least 10 degrees of Lt ankle dorsiflexion AROM to improve gait mechanics.  Baseline: see above Goal status: MET  4.  Patient will complete TUG in </= 12 seconds to reduce fall risk.  Baseline: see above Goal status: MET   LONG TERM GOALS: Target date: 07/26/24  Patient will score >/= 60/104 on the Foot and Ankle Disability Index (MDC 7) to signify clinically meaningful improvement in  functional abilities.  Baseline: see above Goal status: INITIAL  2.  Patient will maintain tandem stance for at least 10 seconds to improve gait stability.  Baseline: see above Goal status: INITIAL  3.  Patient will be modified independent with curb/stair negotiation.  Baseline: uses cane and requires support from spouse  Goal status: INITIAL  4.  Patient will self-report ability to tolerate at least 30 minutes of standing/walking activity.  Baseline: 10 minutes  Goal status: INITIAL  5.  Patient will demonstrate at least 4/5 Lt ankle strength to improve stability when navigating on uneven terrain.  Baseline: see above  Goal status: INITIAL  6.  Patient will be independent with advanced home program to progress/maintain current level of function.  Baseline: initial HEP issued  Goal status: INITIAL   7. Patient will tolerate rolling R and L without d/o dizziness.   Baseline: 4/10 dizzines   Goal status: INITIAL   8. Patient will tolerate gaze x 1 viewing in sitting x 20 seconds to work towards improved tolerance to turns in standing.   Baseline: Unable to do 2 reps in sitting   Goal status: INITIAL  PLAN:  PT FREQUENCY: 2x/week  PT DURATION: 12 weeks  PLANNED INTERVENTIONS: 97164- PT Re-evaluation, 97750- Physical Performance Testing, 97110-Therapeutic exercises, 97530- Therapeutic activity, [id number]- Neuromuscular re-education, 97535- Self Care, [id number]- Manual therapy, [id number]- Gait training, [id number]- Aquatic Therapy, [id number]- Vasopneumatic device, Taping, Dry Needling, Cryotherapy, and Moist heat  PLAN FOR NEXT SESSION: review and progress HEP; ankle AROM and strengthening as tolerated; static balance. For vestibular: Progress habituation (seated head turns>standing head turns), add brandt daroff habituation, work on tolerance to very slow gaze (try to begin vertical plane before trying horizontal again).  Consider BBQ roll for canolith repositioning to R if symptoms remain after  habituation (responded well to habituation in clinic and may not need).   Cashtyn Pouliot, PT 06/16/24 11:55 AM

## 2024-06-16 NOTE — Progress Notes (Signed)
 [facility] Counselor/Therapist Progress Note  Patient ID: KIMMBERLY WISSER, MRN: [id number],    Date: 06/16/2024  Time Spent: 57 minutes 1004-1101am   Treatment Type: Individual Therapy  Risk Assessment: Danger to Self:  No Self-injurious Behavior: No Danger to Others: No  Subjective: This session was held via video teletherapy. The patient consented to video teletherapy and was located in her home during this session. She is aware it is the responsibility of the patient to secure confidentiality on her end of the session. The provider was in a private home office for the duration of this session.    The patient arrived on time for the [name] appointment.   Issues addressed: 1-[name], [name]'s youngest daughter (76) -she did not want a child but announced that she is expecting a baby -pt is so excited despite [name] only being [redacted] weeks pregnant -she and her husband came to visit from [facility] and told them over lunch -reminded pt it's not about what pt is getting but what they are getting 2-relationship with daughter in law Olam -must be motivated by extending herself to other and not getting what she wants -how to mend her relationship with her daughter in law -pt misses that she has not had any relationship with her grands and great grandchildren 2-physical -is struggling -pt had a colonoscopy and finds she is not recovering from the anesthesia as in the past    Treatment Plan Problems: anxiety, depression, family conflict Goals: Alleviate depressive symptoms Recognize, accept, and cope with depressive feelings Develop healthy thinking patterns Develop healthy interpersonal relationships (particularly younger daughter) Reduce overall frequency, intensity, and duration of anxiety Stabilize anxiety level while increasing ability to function Enhance ability to effectively cope with full variety of stressors Learn and implement coping skills that result in a reduction  of anxiety   Objectives target date for all objectives is 09/26/2024: Verbalize an understanding of the cognitive, physiological, and behavioral components of anxiety    60 Learning and implement calming skills to reduce overall anxiety         60 Verbalize an understanding of the role that cognitive biases play in excessive irrational worry and persistent anxiety symptoms 70 Identify, challenge, and replace biased fearful self-talk  60 Learn and implement problem solving strategies   70 Identify and engage in pleasant activities    80 Learning and implement personal and interpersonal skills to reduce anxiety and improve interpersonal relationships   60 Learn to accept limitations in life and commit to tolerating, rather than avoiding, unpleasant emotions while accomplishing meaningful goal 60 Identify major life conflicts from the past and present that form the basis for present anxiety      70 Maintain involvement in work, family, and social activities 70 Reestablish a consistent sleep-wake cycle   70 Verbalize an accurate understanding of depression  60 Identify and replace thoughts that support depression  21 Learn and implement behavioral strategies   60 Verbalize an understanding and resolution of current interpersonal problems       34 Learn and implement decision making skills   33 Learn and implement conflict resolution skills to resolve interpersonal problems       60 Verbalize an understanding of healthy and unhealthy emotions verbalize insight into how past relationships may be influence current experiences with depression     60 Use mindfulness and acceptance strategies and increase value based behavior        60 Increase hopeful statements about the future.  60 Interventions: Engage the patient in behavioral activation Use instruction, modeling, and role-playing to build the patient 's general social, communication, and/or conflict resolution skills Use Acceptance and  Commitment Therapy to help patient  accept uncomfortable realities in order to accomplish value-consistent goals Reinforce the patient 's insight into the role of her past emotional pain and present anxiety  Support the patient in following through with work, family, and social activities Teach and implement sleep hygiene practices  Teach the patient relaxation skills Assign the patient homework Discuss examples demonstrating that unrealistic worry overestimates the probability of threats and underestimates patient's ability  Assist the patient in analyzing his or her worries Help patient understand that avoidance is reinforcing  Consistent with treatment model, discuss how change in cognitive, behavioral, and interpersonal can help patient alleviate depression CBT Behavioral activation to help the patient explore the relationship, nature of the dispute Help the patient develop new interpersonal skills and relationships Conduct Problem solving therapy Teach conflict resolution skills Use a process-experiential approach Conduct ACT  Diagnosis:Major depressive disorder, recurrent episode, moderate (HCC)  Generalized anxiety disorder  PTSD (post-traumatic stress disorder)  Plan:  -meet on Monday, June 30, 2024 at [employer].

## 2024-06-17 LAB — SURGICAL PATHOLOGY

## 2024-06-18 ENCOUNTER — Other Ambulatory Visit: Payer: Self-pay | Admitting: Family Medicine

## 2024-06-18 MED ORDER — GABAPENTIN 300 MG PO CAPS: ORAL_CAPSULE | ORAL | 1 refills | Status: DC

## 2024-06-18 NOTE — Telephone Encounter (Signed)
 Copied from CRM [id number]. Topic: Clinical - Medication Refill >> Jun 18, 2024 10:16 AM Chiquita SQUIBB wrote: Medication:  gabapentin  gabapentin  (NEURONTIN ) 300 MG capsule   Has the patient contacted their pharmacy? Yes- Pharmacy stated they have not heard back from the office.  (Agent: If no, request that the patient contact the pharmacy for the refill. If patient does not wish to contact the pharmacy document the reason why and proceed with request.) (Agent: If yes, when and what did the pharmacy advise?)  This is the patient's preferred pharmacy:  [facility] [address] [place] - [address] [place] [address] Phone: [phone] Fax: [phone]  Is this the correct pharmacy for this prescription? Yes If no, delete pharmacy and type the correct one.   Has the prescription been filled recently? No  Is the patient out of the medication? Yes  Has the patient been seen for an appointment in the last year OR does the patient have an upcoming appointment? Yes  Can we respond through MyChart? No  Agent: Please be advised that Rx refills may take up to 3 business days. We ask that you follow-up with your pharmacy.

## 2024-06-19 ENCOUNTER — Ambulatory Visit: Payer: Self-pay | Admitting: Gastroenterology

## 2024-06-19 DIAGNOSIS — D509 Iron deficiency anemia, unspecified: Secondary | ICD-10-CM

## 2024-06-22 NOTE — Assessment & Plan Note (Signed)
 Supplement and monitor

## 2024-06-22 NOTE — Assessment & Plan Note (Signed)
 Monitor and report, no changes to meds. Encouraged heart healthy diet such as the DASH diet and exercise as tolerated.

## 2024-06-22 NOTE — Assessment & Plan Note (Signed)
 Stable follows with pulmonology. No changes

## 2024-06-22 NOTE — Assessment & Plan Note (Signed)
 Doing well on current medications

## 2024-06-22 NOTE — Assessment & Plan Note (Signed)
 Hydrate and monitor

## 2024-06-22 NOTE — Assessment & Plan Note (Signed)
 hgba1c acceptable, minimize simple carbs. Increase exercise as tolerated.

## 2024-06-22 NOTE — Assessment & Plan Note (Signed)
 Does not tolerate statins.

## 2024-06-22 NOTE — Assessment & Plan Note (Signed)
Statin intolerant. Encourage heart healthy diet such as MIND or DASH diet, increase exercise, avoid trans fats, simple carbohydrates and processed foods, consider a krill or fish or flaxseed oil cap daily.

## 2024-06-23 ENCOUNTER — Encounter: Payer: Self-pay | Admitting: Family Medicine

## 2024-06-23 ENCOUNTER — Ambulatory Visit (INDEPENDENT_AMBULATORY_CARE_PROVIDER_SITE_OTHER): Payer: Medicare Other | Admitting: Family Medicine

## 2024-06-23 VITALS — BP 128/82 | HR 80 | Resp 16 | Ht 64.5 in | Wt 128.2 lb

## 2024-06-23 DIAGNOSIS — Z789 Other specified health status: Secondary | ICD-10-CM | POA: Diagnosis not present

## 2024-06-23 DIAGNOSIS — R600 Localized edema: Secondary | ICD-10-CM

## 2024-06-23 DIAGNOSIS — R739 Hyperglycemia, unspecified: Secondary | ICD-10-CM

## 2024-06-23 DIAGNOSIS — F418 Other specified anxiety disorders: Secondary | ICD-10-CM

## 2024-06-23 DIAGNOSIS — J4489 Other specified chronic obstructive pulmonary disease: Secondary | ICD-10-CM

## 2024-06-23 DIAGNOSIS — N183 Chronic kidney disease, stage 3 unspecified: Secondary | ICD-10-CM | POA: Diagnosis not present

## 2024-06-23 DIAGNOSIS — E785 Hyperlipidemia, unspecified: Secondary | ICD-10-CM

## 2024-06-23 DIAGNOSIS — E538 Deficiency of other specified B group vitamins: Secondary | ICD-10-CM | POA: Diagnosis not present

## 2024-06-23 DIAGNOSIS — E559 Vitamin D deficiency, unspecified: Secondary | ICD-10-CM | POA: Diagnosis not present

## 2024-06-23 DIAGNOSIS — I10 Essential (primary) hypertension: Secondary | ICD-10-CM | POA: Diagnosis not present

## 2024-06-23 DIAGNOSIS — D649 Anemia, unspecified: Secondary | ICD-10-CM

## 2024-06-23 DIAGNOSIS — R0600 Dyspnea, unspecified: Secondary | ICD-10-CM | POA: Diagnosis not present

## 2024-06-23 MED ORDER — ATENOLOL 25 MG PO TABS: 25.0000 mg | ORAL_TABLET | Freq: Two times a day (BID) | ORAL | 1 refills | Status: AC

## 2024-06-23 MED ORDER — GABAPENTIN 300 MG PO CAPS: ORAL_CAPSULE | ORAL | 1 refills | Status: DC

## 2024-06-23 MED ORDER — ALBUTEROL SULFATE HFA 108 (90 BASE) MCG/ACT IN AERS: 2.0000 | INHALATION_SPRAY | RESPIRATORY_TRACT | 1 refills | Status: DC | PRN

## 2024-06-23 NOTE — Assessment & Plan Note (Addendum)
 Has started wearing compression socks due to afternoon swelling especially in the toes and base of toes across toes and then it spreads up the legs. May use Furosemide  20 mg daily prn

## 2024-06-23 NOTE — Progress Notes (Signed)
 Subjective:    Patient ID: [name], female    DOB: 1946/12/05, 78 y.o.   MRN: [id number]  Chief Complaint  Patient presents with   Medical Management of Chronic Issues    Patient presents today for a month follow-up   Quality Metric Gaps    Urine microalbumin     HPI Discussed the use of AI scribe software for clinical note transcription with the patient, who gave verbal consent to proceed.  History of Present Illness [name] is a 78 year old female who presents with leg swelling and shortness of breath.  She has experienced leg swelling for the past three months, particularly in the leg with previous ankle reconstruction. By the afternoon, her toes become difficult to move due to the swelling, and there is pain across her foot. She describes a 'big old knot' that swells up and often elevates her legs to alleviate the swelling. She has been wearing compression socks provided by her daughter but finds them 'crazy looking.'  She experiences shortness of breath, especially when walking upstairs, and uses an albuterol  inhaler before going outside. She has a history of smoking, having started at age 75 and quit at 17, and describes herself as a 'closet smoker.' She has been trying to use a spirometer to improve her breathing.  She has been using Lasix  for swelling but is cautious due to its effects on her kidneys. She takes breaks from the medication, noting that she did not swell much the previous day and was able to attend church, which she has done for five consecutive weeks.  She reports difficulty with constipation, having gone up to 14 days without a bowel movement, which causes pain and bleeding. She uses MiraLAX , stool softeners, and Linzess  (145 mcg) to manage her symptoms. She has a history of polyps in her stomach and has had her throat stretched to improve swallowing.    Past Medical History:  Diagnosis Date   Abdominal pain 10/26/2013   Allergic rhinitis  01/25/2016   ANA positive 07/29/2013   Anemia    Arthralgia 08/18/2013   Arthritis    Asthma, allergic 08/05/2012   Atrial flutter    past history- not current   Atypical chest pain 01/01/2015   Bilateral carpal tunnel syndrome 03/04/2015   CAD in native artery 08/25/2010   Minor CAD 2009, 2011, low risk Myoview  2013 and 2016        Cataract    bilaterally removed    Cellulitis of breast 02/05/2023   Chronic constipation    Chronic cough 01/09/2017   Chronic interstitial cystitis 02/01/2017   Chronic night sweats 01/08/2015   Common bile duct dilatation 02/13/2023   Concussion    x 3   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   DDD (degenerative disc disease), cervical 09/17/2018   Degenerative scoliosis 04/22/2018   Deviated septum 05/12/2016   Dysphagia    Dyspnea    Dysuria 03/09/2021   Elevated alkaline phosphatase level 01/25/2023   Essential hypertension 08/25/2010   External hemorrhoids 02/05/2015   Facial trauma 01/22/2023   Family history of adverse reaction to anesthesia    Family history of malignant hyperthermia    father had this   Fibrocystic breast disease 10/18/2010   Fibromyalgia    Frequency of urination    Generalized anxiety disorder 08/03/2010   GERD (gastroesophageal reflux disease)    Headache 10/13/2013   High serum vitamin D  05/18/2021   History of chronic bronchitis  History of colonic polyp 02/25/2019   History of hiatal hernia    History of tachycardia    CONTROLLED  WITH ATENOLOL    Hyperglycemia 03/08/2021   Hyperkalemia 09/28/2022   Hyperlipidemia    Intertrigo 12/13/2022   Kidney stone 02/26/2019   Knee pain, right 04/30/2015   Left hip pain 04/30/2015   Leg pain 02/24/2011   Qualifier: Diagnosis of   By: Antonio DOJamee         Leukocytosis 07/05/2022   Local reaction to hymenoptera sting 05/09/2023   Low back pain 09/08/2020   Major depressive disorder 02/05/2013   Malignant neoplasm of lower-outer quadrant of right breast of  female, estrogen receptor positive 01/03/2013   Nasal turbinate hypertrophy 05/12/2016   Oral herpes 02/05/2023   OSA and COPD overlap syndrome 06/10/2020   Osteoporosis 01/2019   T score -2.2 stable/improved from prior study   Pelvic pain    Peripheral neuropathy 05/22/2014   Personal history of radiation therapy    Pneumonia    Primary insomnia 06/25/2017   Pulmonary nodules 02/12/2015   Rash and other nonspecific skin eruption 05/09/2023   Recurrent falls 05/02/2020   Recurrent sinusitis 02/25/2019   Right arm pain 07/20/2016   RLS (restless legs syndrome) 11/09/2019   RUQ pain 07/05/2022   S/P radiation therapy 11/12/12 - 12/05/12   right Breast   Sciatica 04/30/2015   Sepsis 2014   from UTI    Sjogren's disease 12/12/2021   Splenic lesion 09/02/2012   Sprain of lateral ligament of ankle joint 07/31/2023   Stage 3 chronic kidney disease    Statin intolerance 02/29/2020   Stricture and stenosis of esophagus 10/19/2011   Tongue lesion 02/05/2023   Tremor 07/05/2022   Urgency of urination    Vertigo 01/22/2023   Vitamin B12 deficiency 03/08/2021   Vocal cord cyst 05/18/2021    Past Surgical History:  Procedure Laterality Date   24 HOUR PH STUDY N/A 04/03/2016   Procedure: 24 HOUR PH STUDY;  Surgeon: Elspeth Deward Naval, MD;  Location: [facility];  Service: Gastroenterology;  Laterality: N/A;   ANKLE ARTHROSCOPY WITH RECONSTRUCTION Left 03/12/2024   Procedure: ANKLE ARTHROSCOPY WITH OPEN LATERAL ANKLE LIGAMENT STABILIZATION;  Surgeon: Barton Drape, MD;  Location: [facility];  Service: Orthopedics;  Laterality: Left;   BREAST BIOPSY Right 08/23/2012   ADH   BREAST BIOPSY Right 10/11/2012   Ductal Carcinoma   BREAST EXCISIONAL BIOPSY Right 09/04/2013   benign   BREAST LUMPECTOMY Right 10/11/2012   W/ [facility]N BX   CARDIAC CATHETERIZATION  09-13-2007  DR MORRIS   WELL-PRESERVED LVF/  DIFFUSE SCATTERED CORONARY CALCIFACATION AND ATHEROSCLEROSIS  WITHOUT OBSTRUCTION   CARDIAC CATHETERIZATION  08-04-2010  DR MCALHANY   NON-OBSTRUCTIVE CAD/  pLAD 40%/  oLAD 30%/  mLAD 30%/  pRCA 30%/  EF 60%   CARDIOVASCULAR STRESS TEST  06-18-2012  DR McALHANY   LOW RISK NUCLEAR STUDY/  SMALL FIXED AREA OF MODERATELY DECREASED UPTAKE IN ANTEROSEPTAL WALL WHICH MAY BE ARTIFACTUAL/  NO ISCHEMIA/  EF 68%   COLONOSCOPY  09/29/2010   CYSTOSCOPY     CYSTOSCOPY WITH HYDRODISTENSION AND BIOPSY N/A 03/06/2014   Procedure: CYSTOSCOPY/HYDRODISTENSION/ INSTILATION OF MARCAINE  AND PYRIDIUM ;  Surgeon: Arlena LILLETTE Gal, MD;  Location: [facility];  Service: Urology;  Laterality: N/A;   CYSTOSCOPY/URETEROSCOPY/HOLMIUM LASER/STENT PLACEMENT Left 03/21/2023   Procedure: CYSTOSCOPY LEFT RETROGRADE PYELOGRAM, LEFT URETEROSCOPY, HOLMIUM LASER LITHOTRIPSYM, AND LEFT URETERAL STENT PLACEMENT;  Surgeon: Devere Lonni Righter, MD;  Location: [facility];  Service: Urology;  Laterality: Left;  60 MINUTES   ESOPHAGEAL MANOMETRY N/A 04/03/2016   Procedure: ESOPHAGEAL MANOMETRY (EM);  Surgeon: Elspeth Deward Naval, MD;  Location: [facility];  Service: Gastroenterology;  Laterality: N/A;   EXTRACORPOREAL SHOCK WAVE LITHOTRIPSY Left 02/06/2019   Procedure: EXTRACORPOREAL SHOCK WAVE LITHOTRIPSY (ESWL);  Surgeon: Ottelin, Mark, MD;  Location: [facility];  Service: Urology;  Laterality: Left;   NASAL SINUS SURGERY  1985   ORIF RIGHT ANKLE  FX  2006   POLYPECTOMY     REMOVAL VOCAL CORD CYST  01/2013   REPAIR OF PERONEUS BREVIS TENDON Left 03/12/2024   Procedure: REPAIR OF PERONEUS TENDON;  Surgeon: Barton Drape, MD;  Location: [facility];  Service: Orthopedics;  Laterality: Left;   RIGHT BREAST BX  08/23/2012   RIGHT HAND SURGERY  X3  LAST ONE 2009   INCLUDES  ORIF RIGHT 5TH FINGER AND REVISION TWICE   SKIN CANCER EXCISION     face,arms   TONSILLECTOMY AND ADENOIDECTOMY  AGE 15   TOTAL ABDOMINAL HYSTERECTOMY W/ BILATERAL SALPINGOOPHORECTOMY  1982    W/  APPENDECTOMY   TRANSTHORACIC ECHOCARDIOGRAM  06/24/2012   GRADE I DIASTOLIC DYSFUNCTION/  EF 55-60%/  MILD MR   UPPER GASTROINTESTINAL ENDOSCOPY      Family History  Problem Relation Age of Onset   Rectal cancer Mother    Colon cancer Mother    Pancreatic cancer Mother    Diabetes Mother    Breast cancer Mother 29   Breast cancer Maternal Aunt        breast   Birth defects Maternal Aunt    Irritable bowel syndrome Son    Heart disease Son        CAD, MV replacement   Allergic Disorder Daughter    Diabetes Daughter    Colon cancer Father    Colon polyps Father    Diabetes Father    Stroke Father    Heart disease Father        CHF   Hyperlipidemia Father    Hypertension Father    Arthritis Father    Breast cancer Paternal Aunt    Breast cancer Paternal Aunt    Arthritis Paternal Uncle    Allergic Disorder Daughter    Heart disease Cousin        CAD, had a blood clot after stenting   Esophageal cancer Neg Hx    Stomach cancer Neg Hx     Social History   Socioeconomic History   Marital status: Married    Spouse name: Tanda    Number of children: 3   Years of education: 16   Highest education level: Bachelor's degree (e.g., BA, AB, BS)  Occupational History   Occupation: Retired    Comment: Charity fundraiser  Tobacco Use   Smoking status: Former    Current packs/day: 0.00    Average packs/day: 1 pack/day for 15.0 years (15.0 ttl pk-yrs)    Types: Cigarettes    Start date: 05/27/1990    Quit date: 05/27/2005    Years since quitting: 19.0    Passive exposure: Never   Smokeless tobacco: Never  Vaping Use   Vaping status: Never Used  Substance and Sexual Activity   Alcohol use: No    Alcohol/week: 0.0 standard drinks of alcohol   Drug use: No   Sexual activity: Not Currently    Partners: Male    Birth control/protection: Surgical    Comment: 1st intercourse 78 yo-Fewer  than 5 partners, hysterectomy  Other Topics Concern   Not on file  Social History Narrative    Lives with husband.   Caffeine use: 1/2 cup per day   Exercise-- 2days a week [employer],  Water  aerobics, walking       Retired Engineer, civil (consulting)   No dietary restrictions, tries to maintain a heart healthy diet   Very pleasant lady   Social Drivers of Corporate investment banker Strain: Low Risk  (06/21/2024)   Overall Financial Resource Strain (CARDIA)    Difficulty of Paying Living Expenses: Not very hard  Food Insecurity: No Food Insecurity (06/21/2024)   Hunger Vital Sign    Worried About Running Out of Food in the Last Year: Never true    Ran Out of Food in the Last Year: Never true  Transportation Needs: No Transportation Needs (06/21/2024)   PRAPARE - Administrator, Civil Service (Medical): No    Lack of Transportation (Non-Medical): No  Physical Activity: Insufficiently Active (06/21/2024)   Exercise Vital Sign    Days of Exercise per Week: 2 days    Minutes of Exercise per Session: 20 min  Stress: No Stress Concern Present (06/21/2024)   [employer] of Occupational Health - Occupational Stress Questionnaire    Feeling of Stress: Only a little  Social Connections: Socially Integrated (06/21/2024)   Social Connection and Isolation Panel    Frequency of Communication with Friends and Family: More than three times a week    Frequency of Social Gatherings with Friends and Family: Twice a week    Attends Religious Services: More than 4 times per year    Active Member of [employer] or Organizations: Yes    Attends Engineer, structural: More than 4 times per year    Marital Status: Married  Catering manager Violence: Not At Risk (03/04/2024)   Humiliation, Afraid, Rape, and Kick questionnaire    Fear of Current or Ex-Partner: No    Emotionally Abused: No    Physically Abused: No    Sexually Abused: No    Outpatient Medications Prior to Visit  Medication Sig Dispense Refill   acetaminophen  (TYLENOL ) 500 MG tablet Take 1,000 mg by mouth every 6 (six) hours as needed for  mild pain or headache.      albuterol  (VENTOLIN  HFA) 108 (90 Base) MCG/ACT inhaler Inhale 2 puffs into the lungs every 4 (four) hours as needed for wheezing or shortness of breath (coughing fits). 18 g 1   amLODipine  (NORVASC ) 5 MG tablet Take 1 tablet by mouth once daily 90 tablet 3   aspirin  EC 81 MG tablet Take 1 tablet (81 mg total) by mouth daily. 90 tablet 3   atenolol  (TENORMIN ) 25 MG tablet Take 1 tablet by mouth twice daily 180 tablet 3   carboxymethylcellul-glycerin (OPTIVE) 0.5-0.9 % ophthalmic solution Place 1 drop into both eyes daily as needed for dry eyes.     Cholecalciferol  (VITAMIN D3) 50 MCG (2000 UT) CAPS Take 2,000 Units by mouth daily.     cyanocobalamin  (VITAMIN B12) 1000 MCG tablet Take 1,000 mg by mouth daily with breakfast.     diclofenac Sodium (VOLTAREN) 1 % GEL Apply 2 g topically daily as needed (Back pain).     DULoxetine  (CYMBALTA ) 60 MG capsule Take 1 capsule (60 mg total) by mouth 2 (two) times daily. 180 capsule 1   EPINEPHrine  0.3 mg/0.3 mL IJ SOAJ injection Inject 0.3 mg into the muscle as needed for anaphylaxis. 2 each  1   Evolocumab  (REPATHA  SURECLICK) 140 MG/ML SOAJ Inject 140 mg into the skin every 14 (fourteen) days. 6 mL 0   fluticasone  (FLONASE ) 50 MCG/ACT nasal spray Place 2 sprays into both nostrils daily. 16 g 5   fluticasone  (FLOVENT  HFA) 220 MCG/ACT inhaler Inhale 1-2 puffs into the lungs in the morning and at bedtime. 1 each 12   folic acid  (FOLVITE ) 1 MG tablet Take 1 tablet (1 mg total) by mouth daily.     furosemide  (LASIX ) 20 MG tablet TAKE 1 TABLET BY MOUTH AS NEEDED FOR EDEMA 90 tablet 3   gabapentin  (NEURONTIN ) 300 MG capsule Take 600 mg in the morning 300 mg at lunch time and 900 mg at bedtime 180 capsule 1   hydrOXYzine  (ATARAX ) 10 MG tablet Take 0.5-2 tablets (5-20 mg total) by mouth 3 (three) times daily as needed for anxiety (not with Loratadine ). 90 tablet 2   linaclotide  (LINZESS ) 72 MCG capsule Take 1 capsule (72 mcg total) by mouth  daily before breakfast. 12 capsule    loratadine  (CLARITIN ) 10 MG tablet Take 10 mg by mouth daily as needed for rhinitis.     nitroGLYCERIN  (NITROSTAT ) 0.4 MG SL tablet Place 1 tablet (0.4 mg total) under the tongue every 5 (five) minutes x 3 doses as needed for chest pain. 25 tablet 2   nystatin  cream (MYCOSTATIN ) Apply 1 application topically 2 (two) times daily. (Patient taking differently: Apply 1 application  topically daily as needed (vaginal yeast).) 30 g 2   polyethylene glycol (MIRALAX ) 17 g packet Take 17 g by mouth 2 (two) times daily. 14 each 0   sucralfate  (CARAFATE ) 1 GM/10ML suspension Take 10 mLs (1 g total) by mouth 4 (four) times daily -  with meals and at bedtime. 420 mL 1   traMADol  (ULTRAM ) 50 MG tablet Take 1 tablet by mouth 3 (three) times daily as needed.     traZODone  (DESYREL ) 100 MG tablet Take 1 tablet (100 mg total) by mouth at bedtime. 90 tablet 0   Vitamin D , Ergocalciferol , (DRISDOL ) 1.25 MG (50000 UNIT) CAPS capsule Take 1 capsule (50,000 Units total) by mouth every 7 (seven) days. 12x Weeks 5 capsule 3   Vonoprazan Fumarate  (VOQUEZNA ) 10 MG TABS Take 1 capsule by mouth daily.     No facility-administered medications prior to visit.    Allergies  Allergen Reactions   Clindamycin Hcl Shortness Of Breath and Rash   Levofloxacin  Rash    Other reaction(s): sick, rash hives severe itching   Other Other (See Comments)    Father had malignant hyperthermia   Penicillins Anaphylaxis   Rosuvastatin  Anaphylaxis   Baclofen  Rash   Codeine Hives and Nausea Only   Erythromycin Hives   Haemophilus Influenzae Vaccines Other (See Comments)    Local reaction at site   Latex Hives   Lincomycin Other (See Comments)   Pentazocine Lactate Other (See Comments)    HALLUCINATION   Pneumococcal Vaccine Polyvalent Hives, Swelling and Other (See Comments)    REACTION: swelling at injection site   Prednisone  Rash    Other reaction(s): Rash, Hives - can tolerate if she takes  with benadryl     Tamoxifen  Nausea And Vomiting   Doxycycline  Rash   Fluzone [Influenza Virus Vaccine] Hives    Local reaction at the site   Haemophilus Influenzae Other (See Comments)    Local reaction at the site     Review of Systems  Constitutional:  Negative for fever and malaise/fatigue.  HENT:  Negative for congestion.   Eyes:  Negative for blurred vision.  Respiratory:  Positive for shortness of breath.   Cardiovascular:  Positive for leg swelling. Negative for chest pain and palpitations.  Gastrointestinal:  Positive for abdominal pain, blood in stool and constipation. Negative for nausea.  Genitourinary:  Negative for dysuria and frequency.  Musculoskeletal:  Positive for back pain. Negative for falls.  Skin:  Negative for rash.  Neurological:  Positive for dizziness. Negative for loss of consciousness and headaches.  Endo/Heme/Allergies:  Negative for environmental allergies.  Psychiatric/Behavioral:  Positive for depression. The patient is nervous/anxious.        Objective:    Physical Exam Constitutional:      General: She is not in acute distress.    Appearance: Normal appearance. She is well-developed. She is not toxic-appearing.  HENT:     Head: Normocephalic and atraumatic.     Right Ear: External ear normal.     Left Ear: External ear normal.     Nose: Nose normal.   Eyes:     General:        Right eye: No discharge.        Left eye: No discharge.     Conjunctiva/sclera: Conjunctivae normal.   Neck:     Thyroid : No thyromegaly.   Cardiovascular:     Rate and Rhythm: Normal rate and regular rhythm.     Heart sounds: Normal heart sounds. No murmur heard. Pulmonary:     Effort: Pulmonary effort is normal. No respiratory distress.     Breath sounds: Normal breath sounds.  Abdominal:     General: Bowel sounds are normal.     Palpations: Abdomen is soft.     Tenderness: There is no abdominal tenderness. There is no guarding.   Musculoskeletal:         General: Normal range of motion.     Cervical back: Neck supple.  Lymphadenopathy:     Cervical: No cervical adenopathy.   Skin:    General: Skin is warm and dry.   Neurological:     Mental Status: She is alert and oriented to person, place, and time.   Psychiatric:        Mood and Affect: Mood normal.        Behavior: Behavior normal.        Thought Content: Thought content normal.        Judgment: Judgment normal.     BP 128/82   Pulse 80   Resp 16   Ht 5' 4.5 (1.638 m)   Wt 128 lb 3.2 oz (58.2 kg)   SpO2 97%   BMI 21.67 kg/m  Wt Readings from Last 3 Encounters:  06/23/24 128 lb 3.2 oz (58.2 kg)  06/12/24 125 lb (56.7 kg)  05/23/24 129 lb (58.5 kg)    Diabetic Foot Exam - Simple   No data filed    Lab Results  Component Value Date   WBC 9.5 04/21/2024   HGB 13.3 04/21/2024   HCT 41.0 04/21/2024   PLT 293.0 04/21/2024   GLUCOSE 80 04/21/2024   CHOL 161 04/21/2024   TRIG 158.0 (H) 04/21/2024   HDL 46.80 04/21/2024   LDLDIRECT 74.0 06/12/2023   LDLCALC 83 04/21/2024   ALT 14 04/21/2024   AST 21 04/21/2024   NA 139 04/21/2024   K 4.3 04/21/2024   CL 101 04/21/2024   CREATININE 0.93 04/21/2024   BUN 17 04/21/2024   CO2 30 04/21/2024   TSH 2.69  04/21/2024   INR 1.05 08/03/2010   HGBA1C 5.3 04/21/2024    Lab Results  Component Value Date   TSH 2.69 04/21/2024   Lab Results  Component Value Date   WBC 9.5 04/21/2024   HGB 13.3 04/21/2024   HCT 41.0 04/21/2024   MCV 94.6 04/21/2024   PLT 293.0 04/21/2024   Lab Results  Component Value Date   NA 139 04/21/2024   K 4.3 04/21/2024   CHLORIDE 102 04/20/2015   CO2 30 04/21/2024   GLUCOSE 80 04/21/2024   BUN 17 04/21/2024   CREATININE 0.93 04/21/2024   BILITOT 0.7 04/21/2024   ALKPHOS 101 04/21/2024   AST 21 04/21/2024   ALT 14 04/21/2024   PROT 6.4 04/21/2024   ALBUMIN 4.1 04/21/2024   CALCIUM  9.2 04/21/2024   ANIONGAP 8 03/04/2024   EGFR 50.0 08/15/2023   GFR 58.74 (L) 04/21/2024    Lab Results  Component Value Date   CHOL 161 04/21/2024   Lab Results  Component Value Date   HDL 46.80 04/21/2024   Lab Results  Component Value Date   LDLCALC 83 04/21/2024   Lab Results  Component Value Date   TRIG 158.0 (H) 04/21/2024   Lab Results  Component Value Date   CHOLHDL 3 04/21/2024   Lab Results  Component Value Date   HGBA1C 5.3 04/21/2024       Assessment & Plan:  COPD with asthma (HCC) Assessment & Plan: Stable follows with pulmonology. No changes   Depression with anxiety Assessment & Plan: Doing well on current medications   Essential hypertension Assessment & Plan: Monitor and report, no changes to meds. Encouraged heart healthy diet such as the DASH diet and exercise as tolerated.    Orders: -     TSH; Future  Hyperglycemia Assessment & Plan: hgba1c acceptable, minimize simple carbs. Increase exercise as tolerated.   Orders: -     Microalbumin / creatinine urine ratio; Future  Hyperlipidemia, unspecified hyperlipidemia type Assessment & Plan: Statin intolerant Encourage heart healthy diet such as MIND or DASH diet, increase exercise, avoid trans fats, simple carbohydrates and processed foods, consider a krill or fish or flaxseed oil cap daily.    Orders: -     Lipid panel; Future  Stage 3 chronic kidney disease, unspecified whether stage 3a or 3b CKD (HCC) Assessment & Plan: Hydrate and monitor   Orders: -     Microalbumin / creatinine urine ratio; Future -     CBC with Differential/Platelet; Future -     Comprehensive metabolic panel with GFR; Future  Statin intolerance Assessment & Plan: Does not tolerate statins   Vitamin B12 deficiency Assessment & Plan: Supplement and monitor   Orders: -     Vitamin B12; Future  Vitamin D  deficiency Assessment & Plan: Supplement and monitor   Orders: -     VITAMIN D  25 Hydroxy (Vit-D Deficiency, Fractures); Future  Anemia, unspecified type -     Iron, TIBC and  Ferritin Panel; Future  Pedal edema Assessment & Plan: Has started wearing compression socks due to afternoon swelling especially in the toes and base of toes across toes and then it spreads up the legs. May use Furosemide  20 mg daily prn   Dyspnea, unspecified type -     ECHOCARDIOGRAM COMPLETE; Future    Assessment and Plan Assessment & Plan Leg swelling and shortness of breath Leg swelling and shortness of breath with palpitations and chest pain. Possible cardiac involvement given family history. COPD with  asthma may contribute to respiratory symptoms. - Order echocardiogram to assess cardiac function. - Advise use of albuterol  inhaler before outdoor activities. - Refer to pulmonology for further evaluation if breathing worsens. - Continue using compression socks and leg elevation for swelling.   Constipation Severe constipation with infrequent bowel movements causing discomfort and bleeding. Current Linzess  dose may be insufficient. - Advise daily use of MiraLAX  and stool softeners. - Add fiber supplement like Benefiber. - Ensure adequate fluid intake. - Use milk of magnesia, prune juice, and Dulcolax suppository every third day. - Consider increasing Linzess  to 290 mcg if current regimen is ineffective. - Follow up with gastroenterology if constipation persists.  Medication Management Requires medication adjustments for albuterol , gabapentin , and Tylenol . - Refill albuterol  inhaler prescription. - Adjust gabapentin  to 2 tablets in the morning, 2 at lunch, and 3 at bedtime. - Prescribe Tylenol  in a more manageable dosage form.  General Health Maintenance Routine blood work needed to align with nephrology appointment and insurance coverage. - Order blood work for July 29 or later to align with insurance requirements and nephrology appointment in mid-August.  Follow-up Scheduled follow-ups with Harlene and nephrologist. - Ensure contact with pulmonology if respiratory  symptoms worsen. - Advise reaching out to gastroenterology if constipation issues persist.     Harlene Horton, MD

## 2024-06-23 NOTE — Patient Instructions (Addendum)
 If constipation persists at this level return to gastroenterology Encouraged increased hydration and fiber in diet. Daily probiotics. If bowels not moving can use MOM 2 tbls po in 4 oz of warm prune juice by mouth every 2-3 days. If no results then repeat in 4 hours with  Dulcolax suppository pr, may repeat again in 4 more hours as needed. Seek care if symptoms worsen. Consider daily Miralax  and/or Dulcolax if symptoms persist.    Constipation, Adult Constipation is when a person has fewer than three bowel movements in a week, has difficulty having a bowel movement, or has stools (feces) that are dry, hard, or larger than normal. Constipation may be caused by an underlying condition. It may become worse with age if a person takes certain medicines and does not take in enough fluids. Follow these instructions at home: Eating and drinking  Eat foods that have a lot of fiber, such as beans, whole grains, and fresh fruits and vegetables. Limit foods that are low in fiber and high in fat and processed sugars, such as fried or sweet foods. These include french fries, hamburgers, cookies, candies, and soda. Drink enough fluid to keep your urine pale yellow. General instructions Exercise regularly or as told by your health care provider. Try to do 150 minutes of moderate exercise each week. Use the bathroom when you have the urge to go. Do not hold it in. Take over-the-counter and prescription medicines only as told by your health care provider. This includes any fiber supplements. During bowel movements: Practice deep breathing while relaxing the lower abdomen. Practice pelvic floor relaxation. Watch your condition for any changes. Let your health care provider know about them. Keep all follow-up visits as told by your health care provider. This is important. Contact a health care provider if: You have pain that gets worse. You have a fever. You do not have a bowel movement after 4 days. You  vomit. You are not hungry or you lose weight. You are bleeding from the opening between the buttocks (anus). You have thin, pencil-like stools. Get help right away if: You have a fever and your symptoms suddenly get worse. You leak stool or have blood in your stool. Your abdomen is bloated. You have severe pain in your abdomen. You feel dizzy or you faint. Summary Constipation is when a person has fewer than three bowel movements in a week, has difficulty having a bowel movement, or has stools (feces) that are dry, hard, or larger than normal. Eat foods that have a lot of fiber, such as beans, whole grains, and fresh fruits and vegetables. Drink enough fluid to keep your urine pale yellow. Take over-the-counter and prescription medicines only as told by your health care provider. This includes any fiber supplements. This information is not intended to replace advice given to you by your health care provider. Make sure you discuss any questions you have with your health care provider. Document Revised: 10/25/2022 Document Reviewed: 10/25/2022 [employer] Patient Education  2024 [employer]Shortness of Breath, Adult Shortness of breath is when a person has trouble breathing or when a person feels like she or he is having trouble breathing in enough air. Shortness of breath could be a sign of a medical problem. Follow these instructions at home:  Pollutants Do not use any products that contain nicotine or tobacco. These products include cigarettes, chewing tobacco, and vaping devices, such as e-cigarettes. This also includes cigars and pipes. If you need help quitting, ask your health care provider.  Avoid things that can irritate your airways, including: Smoke. This includes campfire smoke, forest fire  smoke, and secondhand smoke from tobacco products. Do not smoke or allow others to smoke in your home. Mold. Dust. Air pollution. Chemical fumes. Things that can give you an allergic reaction  (allergens) if you have allergies. Common allergens include pollen from grasses or trees and animal dander. Keep your living space clean and free of mold and dust. General instructions Pay attention to any changes in your symptoms. Take over-the-counter and prescription medicines only as told by your health care provider. This includes oxygen therapy and inhaled medicines. Rest as needed. Return to your normal activities as told by your health care provider. Ask your health care provider what activities are safe for you. Keep all follow-up visits. This is important. Contact a health care provider if: Your condition does not improve as soon as expected. You have a hard time doing your normal activities, even after you rest. You have new symptoms. You cannot walk up stairs or exercise the way that you normally do. Get help right away if: Your shortness of breath gets worse. You have shortness of breath when you are resting. You feel light-headed or you faint. You have a cough that is not controlled with medicines. You cough up blood. You have pain with breathing. You have pain in your chest, arms, shoulders, or abdomen. You have a fever. These symptoms may be an emergency. Get help right away. Call 911. Do not wait to see if the symptoms will go away. Do not drive yourself to the hospital. Summary Shortness of breath is when a person has trouble breathing enough air. It can be a sign of a medical problem. Avoid things that irritate your lungs, such as smoking, pollution, mold, and dust. Pay attention to changes in your symptoms and contact your health care provider if you have a hard time completing daily activities because of shortness of breath. This information is not intended to replace advice given to you by your health care provider. Make sure you discuss any questions you have with your health care provider. Document Revised: 07/30/2021 Document Reviewed: 07/30/2021 [employer]  Patient Education  2024 [employer].

## 2024-06-24 ENCOUNTER — Ambulatory Visit: Attending: Physician Assistant

## 2024-06-24 DIAGNOSIS — R2681 Unsteadiness on feet: Secondary | ICD-10-CM | POA: Diagnosis not present

## 2024-06-24 DIAGNOSIS — R2689 Other abnormalities of gait and mobility: Secondary | ICD-10-CM | POA: Insufficient documentation

## 2024-06-24 DIAGNOSIS — M25572 Pain in left ankle and joints of left foot: Secondary | ICD-10-CM | POA: Diagnosis not present

## 2024-06-24 DIAGNOSIS — M6281 Muscle weakness (generalized): Secondary | ICD-10-CM | POA: Diagnosis not present

## 2024-06-24 DIAGNOSIS — S93492A Sprain of other ligament of left ankle, initial encounter: Secondary | ICD-10-CM | POA: Diagnosis not present

## 2024-06-24 DIAGNOSIS — R42 Dizziness and giddiness: Secondary | ICD-10-CM | POA: Insufficient documentation

## 2024-06-24 DIAGNOSIS — M7672 Peroneal tendinitis, left leg: Secondary | ICD-10-CM | POA: Diagnosis not present

## 2024-06-24 NOTE — Therapy (Signed)
 OUTPATIENT PHYSICAL THERAPY LOWER EXTREMITY TREATMENT   Patient Name: NICOYA FRIEL MRN: [id number] DOB:12-31-1945, 78 y.o., female Today's Date: 06/24/2024  END OF SESSION:  PT End of Session - 06/24/24 1408     Visit Number 9    Number of Visits 25    Date for PT Re-Evaluation 07/26/24    Authorization Type [employer]    Progress Note Due on Visit 10    PT Start Time 1408   patient late   PT Stop Time 1446    PT Time Calculation (min) 38 min    Activity Tolerance Patient tolerated treatment well    Behavior During Therapy [facility] for tasks assessed/performed           Past Medical History:  Diagnosis Date   Abdominal pain 10/26/2013   Allergic rhinitis 01/25/2016   ANA positive 07/29/2013   Anemia    Arthralgia 08/18/2013   Arthritis    Asthma, allergic 08/05/2012   Atrial flutter    past history- not current   Atypical chest pain 01/01/2015   Bilateral carpal tunnel syndrome 03/04/2015   CAD in native artery 08/25/2010   Minor CAD 2009, 2011, low risk Myoview  2013 and 2016        Cataract    bilaterally removed    Cellulitis of breast 02/05/2023   Chronic constipation    Chronic cough 01/09/2017   Chronic interstitial cystitis 02/01/2017   Chronic night sweats 01/08/2015   Common bile duct dilatation 02/13/2023   Concussion    x 3   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   DDD (degenerative disc disease), cervical 09/17/2018   Degenerative scoliosis 04/22/2018   Deviated septum 05/12/2016   Dysphagia    Dyspnea    Dysuria 03/09/2021   Elevated alkaline phosphatase level 01/25/2023   Essential hypertension 08/25/2010   External hemorrhoids 02/05/2015   Facial trauma 01/22/2023   Family history of adverse reaction to anesthesia    Family history of malignant hyperthermia    father had this   Fibrocystic breast disease 10/18/2010   Fibromyalgia    Frequency of urination    Generalized anxiety disorder 08/03/2010   GERD (gastroesophageal reflux  disease)    Headache 10/13/2013   High serum vitamin D  05/18/2021   History of chronic bronchitis    History of colonic polyp 02/25/2019   History of hiatal hernia    History of tachycardia    CONTROLLED  WITH ATENOLOL    Hyperglycemia 03/08/2021   Hyperkalemia 09/28/2022   Hyperlipidemia    Intertrigo 12/13/2022   Kidney stone 02/26/2019   Knee pain, right 04/30/2015   Left hip pain 04/30/2015   Leg pain 02/24/2011   Qualifier: Diagnosis of   By: Antonio DOJamee         Leukocytosis 07/05/2022   Local reaction to hymenoptera sting 05/09/2023   Low back pain 09/08/2020   Major depressive disorder 02/05/2013   Malignant neoplasm of lower-outer quadrant of right breast of female, estrogen receptor positive 01/03/2013   Nasal turbinate hypertrophy 05/12/2016   Oral herpes 02/05/2023   OSA and COPD overlap syndrome 06/10/2020   Osteoporosis 01/2019   T score -2.2 stable/improved from prior study   Pelvic pain    Peripheral neuropathy 05/22/2014   Personal history of radiation therapy    Pneumonia    Primary insomnia 06/25/2017   Pulmonary nodules 02/12/2015   Rash and other nonspecific skin eruption 05/09/2023   Recurrent falls 05/02/2020   Recurrent sinusitis 02/25/2019  Right arm pain 07/20/2016   RLS (restless legs syndrome) 11/09/2019   RUQ pain 07/05/2022   S/P radiation therapy 11/12/12 - 12/05/12   right Breast   Sciatica 04/30/2015   Sepsis 2014   from UTI    Sjogren's disease 12/12/2021   Splenic lesion 09/02/2012   Sprain of lateral ligament of ankle joint 07/31/2023   Stage 3 chronic kidney disease    Statin intolerance 02/29/2020   Stricture and stenosis of esophagus 10/19/2011   Tongue lesion 02/05/2023   Tremor 07/05/2022   Urgency of urination    Vertigo 01/22/2023   Vitamin B12 deficiency 03/08/2021   Vocal cord cyst 05/18/2021   Past Surgical History:  Procedure Laterality Date   24 HOUR PH STUDY N/A 04/03/2016   Procedure: 24 HOUR PH STUDY;   Surgeon: Elspeth Deward Naval, MD;  Location: [facility];  Service: Gastroenterology;  Laterality: N/A;   ANKLE ARTHROSCOPY WITH RECONSTRUCTION Left 03/12/2024   Procedure: ANKLE ARTHROSCOPY WITH OPEN LATERAL ANKLE LIGAMENT STABILIZATION;  Surgeon: Barton Drape, MD;  Location: [facility];  Service: Orthopedics;  Laterality: Left;   BREAST BIOPSY Right 08/23/2012   ADH   BREAST BIOPSY Right 10/11/2012   Ductal Carcinoma   BREAST EXCISIONAL BIOPSY Right 09/04/2013   benign   BREAST LUMPECTOMY Right 10/11/2012   W/ SLN BX   CARDIAC CATHETERIZATION  09-13-2007  DR MORRIS   WELL-PRESERVED LVF/  DIFFUSE SCATTERED CORONARY CALCIFACATION AND ATHEROSCLEROSIS WITHOUT OBSTRUCTION   CARDIAC CATHETERIZATION  08-04-2010  DR MCALHANY   NON-OBSTRUCTIVE CAD/  pLAD 40%/  oLAD 30%/  mLAD 30%/  pRCA 30%/  EF 60%   CARDIOVASCULAR STRESS TEST  06-18-2012  DR McALHANY   LOW RISK NUCLEAR STUDY/  SMALL FIXED AREA OF MODERATELY DECREASED UPTAKE IN ANTEROSEPTAL WALL WHICH MAY BE ARTIFACTUAL/  NO ISCHEMIA/  EF 68%   COLONOSCOPY  09/29/2010   CYSTOSCOPY     CYSTOSCOPY WITH HYDRODISTENSION AND BIOPSY N/A 03/06/2014   Procedure: CYSTOSCOPY/HYDRODISTENSION/ INSTILATION OF MARCAINE  AND PYRIDIUM ;  Surgeon: Arlena LILLETTE Gal, MD;  Location: [facility];  Service: Urology;  Laterality: N/A;   CYSTOSCOPY/URETEROSCOPY/HOLMIUM LASER/STENT PLACEMENT Left 03/21/2023   Procedure: CYSTOSCOPY LEFT RETROGRADE PYELOGRAM, LEFT URETEROSCOPY, HOLMIUM LASER LITHOTRIPSYM, AND LEFT URETERAL STENT PLACEMENT;  Surgeon: Devere Lonni Righter, MD;  Location: [facility];  Service: Urology;  Laterality: Left;  60 MINUTES   ESOPHAGEAL MANOMETRY N/A 04/03/2016   Procedure: ESOPHAGEAL MANOMETRY (EM);  Surgeon: Elspeth Deward Naval, MD;  Location: [facility];  Service: Gastroenterology;  Laterality: N/A;   EXTRACORPOREAL SHOCK WAVE LITHOTRIPSY Left 02/06/2019   Procedure: EXTRACORPOREAL SHOCK WAVE  LITHOTRIPSY (ESWL);  Surgeon: Ottelin, Mark, MD;  Location: [facility];  Service: Urology;  Laterality: Left;   NASAL SINUS SURGERY  1985   ORIF RIGHT ANKLE  FX  2006   POLYPECTOMY     REMOVAL VOCAL CORD CYST  01/2013   REPAIR OF PERONEUS BREVIS TENDON Left 03/12/2024   Procedure: REPAIR OF PERONEUS TENDON;  Surgeon: Barton Drape, MD;  Location: [facility];  Service: Orthopedics;  Laterality: Left;   RIGHT BREAST BX  08/23/2012   RIGHT HAND SURGERY  X3  LAST ONE 2009   INCLUDES  ORIF RIGHT 5TH FINGER AND REVISION TWICE   SKIN CANCER EXCISION     face,arms   TONSILLECTOMY AND ADENOIDECTOMY  AGE 19   TOTAL ABDOMINAL HYSTERECTOMY W/ BILATERAL SALPINGOOPHORECTOMY  1982   W/  APPENDECTOMY   TRANSTHORACIC ECHOCARDIOGRAM  06/24/2012   GRADE I  DIASTOLIC DYSFUNCTION/  EF 55-60%/  MILD MR   UPPER GASTROINTESTINAL ENDOSCOPY     Patient Active Problem List   Diagnosis Date Noted   Pedal edema 05/20/2024   Otalgia of both ears 04/23/2024   Tinnitus of both ears 04/23/2024   Hypokalemia 12/10/2023   IDA (iron deficiency anemia) 09/12/2023   Stage 3 chronic kidney disease    Right ankle pain 07/31/2023   Common bile duct dilatation 02/13/2023   Oral herpes 02/05/2023   Elevated alkaline phosphatase level 01/25/2023   Vertigo 01/22/2023   Intertrigo 12/13/2022   Hyperkalemia 09/28/2022   Anemia 09/28/2022   Tremor 07/05/2022   RUQ pain 07/05/2022   Leukocytosis 07/05/2022   Sjogren's disease 12/12/2021   Vocal cord cyst 05/18/2021   Vitamin D  deficiency 05/18/2021   Dysuria 03/09/2021   Vitamin B12 deficiency 03/08/2021   Hyperglycemia 03/08/2021   OSA and COPD overlap syndrome 06/10/2020   Recurrent falls 05/02/2020   Statin intolerance 02/29/2020   RLS (restless legs syndrome) 11/09/2019   Recurrent sinusitis 02/25/2019   History of colonic polyp 02/25/2019   DDD (degenerative disc disease), cervical 09/17/2018   Degenerative scoliosis 04/22/2018   Primary  insomnia 06/25/2017   Chronic interstitial cystitis 02/01/2017   Deviated nasal septum 05/12/2016   Hypertrophy of nasal turbinates 05/12/2016   Dysphagia    Allergic rhinitis 01/25/2016   Dyspnea    Sciatica 04/30/2015   Bilateral carpal tunnel syndrome 03/04/2015   Hyperlipidemia 03/01/2015   Pulmonary nodules 02/12/2015   External hemorrhoids 02/05/2015   Chronic night sweats 01/08/2015   Peripheral neuropathy 05/22/2014   Headache 10/13/2013   Arthralgia 08/18/2013   ANA positive 07/29/2013   Depression with anxiety 02/05/2013   Malignant neoplasm of lower-outer quadrant of right breast of female, estrogen receptor positive 01/03/2013   Asthma, allergic 08/05/2012   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   GERD (gastroesophageal reflux disease) 06/03/2012   Stricture and stenosis of esophagus 10/19/2011   Osteoporosis 04/17/2011   Fibrocystic breast disease 10/18/2010   Essential hypertension 08/25/2010   CAD in native artery 08/25/2010   Generalized anxiety disorder 08/03/2010   Bronchitis 03/10/2010   Fibromyalgia 01/11/2010    PCP: Domenica Harlene LABOR, MD   REFERRING PROVIDER: Barton Drape, MD   REFERRING DIAG: [phone] (ICD-10-CM) - Peroneal tendinitis, left leg   THERAPY DIAG:  Pain in left ankle and joints of left foot  Muscle weakness (generalized)  Other abnormalities of gait and mobility  Rationale for Evaluation and Treatment: Rehabilitation  ONSET DATE: 03/12/24  SUBJECTIVE:   SUBJECTIVE STATEMENT: Patient had f/u with surgeon today and was instructed to use brace as needed or supportive footwear. Patient reports the ankle pain is much better. Her back is bothering her the most. No pain in the ankle right now, just feels tight.   PERTINENT HISTORY: S/p Lt ankle arthroscopy Brostrom stabilization, peroneus brevis and longus debridement and tenosynovectomy 03/12/24 See extensive PMH above   PAIN:  Are you having pain? Yes: NPRS scale: 7 Pain location:  low back  Pain description: stabbing Aggravating factors: movement Relieving factors: rest  PRECAUTIONS: Fall,progress per protocol   WEIGHT BEARING RESTRICTIONS: Yes LLE WBAT In ASO brace   FALLS:  Has patient fallen in last 6 months? Yes. Number of falls 2 (fell stepping up grassy hill, fell in her kitchen from turning quickly)  LIVING ENVIRONMENT: Lives with: lives with their spouse Lives in: House/apartment Stairs: Yes: Internal: 16 steps; on left going up Has following equipment at home:  Single point cane  OCCUPATION: retired   PLOF: Independent  PATIENT GOALS: I want to be able to walk again.  NEXT MD VISIT: November 2025   OBJECTIVE:  Note: Objective measures were completed at Evaluation unless otherwise noted.  DIAGNOSTIC FINDINGS: none on file   PATIENT SURVEYS:  FADI: 46/104; 44%  COGNITION: Overall cognitive status: Within functional limits for tasks assessed     SENSATION: See palpation  EDEMA:  Mild swelling about Lt ankle    PALPATION: Hypersensitivity to palpation about distal fibula, lateral malleolus   LOWER EXTREMITY ROM: Active ROM Right eval Left eval 05/05/24 Left  05/20/24 Left  06/03/24 Left   Hip flexion       Hip extension       Hip abduction       Hip adduction       Hip internal rotation       Hip external rotation       Knee flexion       Knee extension       Ankle dorsiflexion  6 9  10   Ankle plantarflexion  50  65 67  Ankle inversion  15  15 17   Ankle eversion  8  8 9    (Blank rows = not tested)  LOWER EXTREMITY MMT: MMT Right eval Left eval  Hip flexion    Hip extension    Hip abduction    Hip adduction    Hip internal rotation    Hip external rotation    Knee flexion    Knee extension    Ankle dorsiflexion  3+  Ankle plantarflexion  3+ sitting  Ankle inversion  3+  Ankle eversion  3+   (Blank rows = not tested)  FUNCTIONAL TESTS:  TUG: 13.5 seconds Romberg x 30 seconds Semi-tandem unable    06/09/24: TUG: 11.1 seconds   GAIT: Distance walked: 20 ft  Assistive device utilized: Single point cane. Ankle brace Level of assistance: Modified independence Comments: limited push-off LLE, foot ER,   [facility] Adult PT Treatment:                                                DATE: 06/24/24 Therapeutic Exercise: Resisted plantarflexion red band 2 x 10  Resisted ankle eversion/inversion 2 x 10 red band  Seated calf raise on step 5 lbs 2 x 10   Neuromuscular re-ed: Standing hip abduction 2 x 10   Therapeutic Activity: Leg press 2 x 10 @ 40 lbs   Self Care: Discussed supportive footwear options with online viewing of options and where to purchase.   [facility] Adult PT Treatment:                                                DATE: 06/16/24 PT re-eval  VESTIBULAR ASSESSMENT: GENERAL OBSERVATION: Patient arrives with [facility] and wide base of support   SYMPTOM BEHAVIOR:  Subjective history: unsteadiness and spinning  Non-Vestibular symptoms: none  Type of dizziness: Imbalance (Disequilibrium), Spinning/Vertigo, Unsteady with head/body turns, and Lightheadedness/Faint  Frequency: daily  Duration: seconds to minutes  Aggravating factors: bending turning, rolling in bed  unable to hold her head back in the shower  Relieving factors: head stationary  Progression of symptoms:  worse after recent surgery  OCULOMOTOR EXAM:  Ocular Alignment: normal  Ocular ROM: No Limitations  Spontaneous Nystagmus: absent  Gaze-Induced Nystagmus: absent  Smooth Pursuits: intact however she gets sensation of dizziness moving eyes L>R with smooth pursuits (doesn't improve with repetitions)  Saccades: intact     VESTIBULAR - OCULAR REFLEX:   Slow VOR: Comment: x 1-2 reps gets immediate sensation of out of control and sharp sensation of dizziness  VOR Cancellation: Normal, but notes neck pain and tightness  Head-Impulse Test: HIT Right: did not test due to significant reaction with slow VOR HIT Left: did  not test    POSITIONAL TESTING:  Right Roll Test: provokes sensation of spinning (room moving towards the floor), but not able to view nystagmus in room light Left Roll Test: no dizziness or nystagmus Right Sidelying: trace of dizziness x 5 seconds Left Sidelying:  trace of dizziness x 5 seconds   MOTION SENSITIVITY:   Motion Sensitivity Quotient Intensity: 0 = none, 1 = Lightheaded, 2 = Mild, 3 = Moderate, 4 = Severe, 5 = Vomiting  Intensity  1. Sitting to supine 3  2. Supine to L side 0  3. Supine to R side 3  4. Supine to sitting 3  5. L Hallpike-Dix   6. Up from L    7. R Hallpike-Dix   8. Up from R    9. Sitting, head  tipped to L knee   10. Head up from L  knee   11. Sitting, head  tipped to R knee   12. Head up from R  knee   13. Sitting head turns x5 3  14.Sitting head nods x5 2  15. In stance, 180  turn to L    16. In stance, 180  turn to R     Neuromuscular re-ed: Habituation  Seated x 5 horizontal head turns provokes 3-4/10 dizziness Seated x 5 vertical head turns provokes 2/10 dizziness Rolling x 3 reps -- symptoms improve with repetition Wilhelmena daroff x 2 reps  -- symptoms improve  Smooth pursuit Horizontal plane provokes Gaze adaptation Unable to tolerate gaze activities   [facility] Adult PT Treatment:                                                DATE: 06/09/24 Therapeutic Exercise: Ankle circles 2 x 10 each CW/CCW Standing calf raise x 4 d/c due to pain Neuromuscular re-ed: Resisted eversion yellow band 2 x 10  Resisted inversion yellow band 2 x 10  Arch lift seated 2 x 10  Therapeutic Activity: TUG Step taps 4 inch 2 x 10    [facility] Adult PT Treatment:                                                DATE: 06/03/24 Therapeutic Exercise: Lt ankle AROM  Neuromuscular re-ed: Standing toe raises 2 x 10  Standing calf raise d/c due to lateral ankle pain Seated calf raise on block 5 lbs 2 x 10  Towel scrunch x 3 Therapeutic Activity: Weight  shifts side to side 2 x 10  Weight shifts A/P 2 x 10  Mini squat with UE support x 8  Self Care: Recommended to loosen  up brace when seated, only needing to lace up/tighten with walking/standing activity.   [facility] Adult PT Treatment:                                                DATE: 05/20/24 Neuromuscular re-ed: Seated ankle rockerboard 2 x 10  Resisted ankle plantarflexion yellow 2 x 10  Resisted ankle inversion yellow 2 x 10  Resisted ankle eversion yellow d/c due to pain Seated SL calf raise on step 2 x 15 @ 5 lbs  Therapeutic Activity: Sit to stand 2 x 5  Weight shift side to side x 10 Weight shift A/P x 10 each    PATIENT EDUCATION:  Education details:HEP update Person educated: Patient Education method: Explanation, demo, cues, handout Education comprehension: verbalized understanding, returned demo   HOME EXERCISE PROGRAM: Access Code: [email or web address] URL: [email or web address] Date: 06/24/2024 Prepared by: Lucie Meeter  Exercises - Long Sitting Calf Stretch with Strap  - 1 x daily - 7 x weekly - 3 sets - 30s ec  hold - Seated Ankle Circles  - 1 x daily - 7 x weekly - 2 sets - 10 reps - Isometric Ankle Eversion at Wall  - 1 x daily - 7 x weekly - 2 sets - 10 reps - Seated Plantar Fascia Mobilization with Small Ball  - 1 x daily - 7 x weekly - 3-5 minute  hold - Seated Great Toe Extension  - 1 x daily - 7 x weekly - 2 sets - 10 reps - Long Sitting Ankle Plantar Flexion with Resistance  - 1 x daily - 7 x weekly - 2 sets - 10 reps - Long Sitting Ankle Inversion with Resistance  - 1 x daily - 7 x weekly - 2 sets - 10 reps - Seated Calf Raise with Weights on Thighs  - 1 x daily - 7 x weekly - 2 sets - 10 reps - Side to Side Weight Shift with Counter Support  - 1 x daily - 7 x weekly - 2 sets - 10 reps - Staggered Stance Forward Backward Weight Shift with Counter Support  - 1 x daily - 7 x weekly - 2 sets - 10 reps - Toe Raises with Counter Support  - 1 x daily - 7  x weekly - 2 sets - 10 reps - Towel Scrunches  - 1 x daily - 7 x weekly - 1 sets - 5 reps - Rolling rightleft sides for vestibular habituation  - 1-2 x daily - 7 x weekly - 1 sets - 3-5 reps - Seated Right Head Turns Vestibular Habituation  - 1-2 x daily - 7 x weekly - 1 sets - 5 reps - Standing Hip Abduction with Counter Support  - 1 x daily - 7 x weekly - 2 sets - 10 repss  ASSESSMENT:  CLINICAL IMPRESSION: Patient had f/u with surgeon today who is pleased with her progress. She was instructed to either use ankle brace or supportive footwear at this time. We discussed supportive footwear options and where to purchase. Able to progress ankle strengthening without onset of ankle pain. Incorporated hip strengthening today to improve distal stability with patient fatiguing quickly with these exercises.   EVAL: Patient is a 78 y.o. female who was seen today for physical therapy evaluation and treatment for s/p Lt ankle arthroscopy Brostrom stabilization, peroneus brevis and  longus debridement and tenosynovectomy on 03/12/24. She demonstrates ROM, strength, gait and balance deficits that are consistent with her recent post-operative status. She will benefit from skilled PT to address the above stated deficits in order to return to optimal function.   OBJECTIVE IMPAIRMENTS: Abnormal gait, decreased activity tolerance, decreased balance, decreased endurance, decreased knowledge of condition, decreased mobility, difficulty walking, decreased ROM, decreased strength, increased edema, impaired flexibility, impaired sensation, improper body mechanics, postural dysfunction, and pain.   GOALS: Goals reviewed with patient? Yes  SHORT TERM GOALS: Target date: 06/09/2024  Patient will be independent and compliant with initial HEP.  Baseline: issued at eval  Goal status: MET  2.  Patient will demonstrate at least 60 degrees of Lt ankle plantarflexion AROM to improve push-off during gait cycle.  Baseline: see  above Goal status: MET  3.  Patient will demonstrate at least 10 degrees of Lt ankle dorsiflexion AROM to improve gait mechanics.  Baseline: see above Goal status: MET  4.  Patient will complete TUG in </= 12 seconds to reduce fall risk.  Baseline: see above Goal status: MET   LONG TERM GOALS: Target date: 07/26/24  Patient will score >/= 60/104 on the Foot and Ankle Disability Index (MDC 7) to signify clinically meaningful improvement in functional abilities.  Baseline: see above Goal status: INITIAL  2.  Patient will maintain tandem stance for at least 10 seconds to improve gait stability.  Baseline: see above Goal status: INITIAL  3.  Patient will be modified independent with curb/stair negotiation.  Baseline: uses cane and requires support from spouse  Goal status: INITIAL  4.  Patient will self-report ability to tolerate at least 30 minutes of standing/walking activity.  Baseline: 10 minutes  Goal status: INITIAL  5.  Patient will demonstrate at least 4/5 Lt ankle strength to improve stability when navigating on uneven terrain.  Baseline: see above  Goal status: INITIAL  6.  Patient will be independent with advanced home program to progress/maintain current level of function.  Baseline: initial HEP issued  Goal status: INITIAL   7. Patient will tolerate rolling R and L without d/o dizziness.   Baseline: 4/10 dizzines   Goal status: INITIAL   8. Patient will tolerate gaze x 1 viewing in sitting x 20 seconds to work towards improved tolerance to turns in standing.   Baseline: Unable to do 2 reps in sitting   Goal status: INITIAL  PLAN:  PT FREQUENCY: 2x/week  PT DURATION: 12 weeks  PLANNED INTERVENTIONS: 97164- PT Re-evaluation, 97750- Physical Performance Testing, 97110-Therapeutic exercises, 97530- Therapeutic activity, [id number]- Neuromuscular re-education, 97535- Self Care, [id number]- Manual therapy, [id number]- Gait training, [id number]- Aquatic Therapy, [id number]- Vasopneumatic  device, Taping, Dry Needling, Cryotherapy, and Moist heat  PLAN FOR NEXT SESSION: review and progress HEP; ankle AROM and strengthening as tolerated; static balance. For vestibular: Progress habituation (seated head turns>standing head turns), add brandt daroff habituation, work on tolerance to very slow gaze (try to begin vertical plane before trying horizontal again).  Consider BBQ roll for canolith repositioning to R if symptoms remain after habituation (responded well to habituation in clinic and may not need). PROGRESS NOTE   Aarian Cleaver, PT, DPT, ATC 06/24/24 2:53 PM

## 2024-06-25 ENCOUNTER — Other Ambulatory Visit: Payer: Self-pay | Admitting: Family

## 2024-06-26 ENCOUNTER — Ambulatory Visit

## 2024-06-26 ENCOUNTER — Telehealth: Payer: Self-pay | Admitting: Gastroenterology

## 2024-06-26 DIAGNOSIS — M25572 Pain in left ankle and joints of left foot: Secondary | ICD-10-CM

## 2024-06-26 DIAGNOSIS — R2681 Unsteadiness on feet: Secondary | ICD-10-CM

## 2024-06-26 DIAGNOSIS — M6281 Muscle weakness (generalized): Secondary | ICD-10-CM

## 2024-06-26 DIAGNOSIS — R2689 Other abnormalities of gait and mobility: Secondary | ICD-10-CM

## 2024-06-26 DIAGNOSIS — R42 Dizziness and giddiness: Secondary | ICD-10-CM

## 2024-06-26 MED ORDER — LINACLOTIDE 145 MCG PO CAPS: 145.0000 ug | ORAL_CAPSULE | Freq: Every day | ORAL | 5 refills | Status: DC

## 2024-06-26 MED ORDER — VOQUEZNA 10 MG PO TABS: 1.0000 | ORAL_TABLET | Freq: Every day | ORAL | 5 refills | Status: DC

## 2024-06-26 NOTE — Telephone Encounter (Signed)
 Inbound call from patient requesting for Linzess  and Voquenza prescriptions to be sent to [facility] on [address] in [place]. Patient requesting prescriptions to be sent before the long weekend due to being out of samples. Please advise, thank you

## 2024-06-26 NOTE — Therapy (Signed)
 OUTPATIENT PHYSICAL THERAPY LOWER EXTREMITY TREATMENT Progress Note Reporting Period 04/28/24 to 06/26/24  See note below for Objective Data and Assessment of Progress/Goals.       Patient Name: [name] MRN: [id number] DOB:09-30-1946, 78 y.o., female Today's Date: 06/26/2024  END OF SESSION:  PT End of Session - 06/26/24 1402     Visit Number 10    Number of Visits 25    Date for PT Re-Evaluation 07/26/24    Authorization Type [employer]    Progress Note Due on Visit 20    PT Start Time 1402    PT Stop Time 1442    PT Time Calculation (min) 40 min    Activity Tolerance Patient tolerated treatment well    Behavior During Therapy [facility] for tasks assessed/performed            Past Medical History:  Diagnosis Date   Abdominal pain 10/26/2013   Allergic rhinitis 01/25/2016   ANA positive 07/29/2013   Anemia    Arthralgia 08/18/2013   Arthritis    Asthma, allergic 08/05/2012   Atrial flutter    past history- not current   Atypical chest pain 01/01/2015   Bilateral carpal tunnel syndrome 03/04/2015   CAD in native artery 08/25/2010   Minor CAD 2009, 2011, low risk Myoview  2013 and 2016        Cataract    bilaterally removed    Cellulitis of breast 02/05/2023   Chronic constipation    Chronic cough 01/09/2017   Chronic interstitial cystitis 02/01/2017   Chronic night sweats 01/08/2015   Common bile duct dilatation 02/13/2023   Concussion    x 3   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   DDD (degenerative disc disease), cervical 09/17/2018   Degenerative scoliosis 04/22/2018   Deviated septum 05/12/2016   Dysphagia    Dyspnea    Dysuria 03/09/2021   Elevated alkaline phosphatase level 01/25/2023   Essential hypertension 08/25/2010   External hemorrhoids 02/05/2015   Facial trauma 01/22/2023   Family history of adverse reaction to anesthesia    Family history of malignant hyperthermia    father had this   Fibrocystic breast disease 10/18/2010    Fibromyalgia    Frequency of urination    Generalized anxiety disorder 08/03/2010   GERD (gastroesophageal reflux disease)    Headache 10/13/2013   High serum vitamin D  05/18/2021   History of chronic bronchitis    History of colonic polyp 02/25/2019   History of hiatal hernia    History of tachycardia    CONTROLLED  WITH ATENOLOL    Hyperglycemia 03/08/2021   Hyperkalemia 09/28/2022   Hyperlipidemia    Intertrigo 12/13/2022   Kidney stone 02/26/2019   Knee pain, right 04/30/2015   Left hip pain 04/30/2015   Leg pain 02/24/2011   Qualifier: Diagnosis of   By: Antonio DOJamee         Leukocytosis 07/05/2022   Local reaction to hymenoptera sting 05/09/2023   Low back pain 09/08/2020   Major depressive disorder 02/05/2013   Malignant neoplasm of lower-outer quadrant of right breast of female, estrogen receptor positive 01/03/2013   Nasal turbinate hypertrophy 05/12/2016   Oral herpes 02/05/2023   OSA and COPD overlap syndrome 06/10/2020   Osteoporosis 01/2019   T score -2.2 stable/improved from prior study   Pelvic pain    Peripheral neuropathy 05/22/2014   Personal history of radiation therapy    Pneumonia    Primary insomnia 06/25/2017   Pulmonary nodules 02/12/2015  Rash and other nonspecific skin eruption 05/09/2023   Recurrent falls 05/02/2020   Recurrent sinusitis 02/25/2019   Right arm pain 07/20/2016   RLS (restless legs syndrome) 11/09/2019   RUQ pain 07/05/2022   S/P radiation therapy 11/12/12 - 12/05/12   right Breast   Sciatica 04/30/2015   Sepsis 2014   from UTI    Sjogren's disease 12/12/2021   Splenic lesion 09/02/2012   Sprain of lateral ligament of ankle joint 07/31/2023   Stage 3 chronic kidney disease    Statin intolerance 02/29/2020   Stricture and stenosis of esophagus 10/19/2011   Tongue lesion 02/05/2023   Tremor 07/05/2022   Urgency of urination    Vertigo 01/22/2023   Vitamin B12 deficiency 03/08/2021   Vocal cord cyst 05/18/2021    Past Surgical History:  Procedure Laterality Date   24 HOUR PH STUDY N/A 04/03/2016   Procedure: 24 HOUR PH STUDY;  Surgeon: Elspeth Deward Naval, MD;  Location: [facility];  Service: Gastroenterology;  Laterality: N/A;   ANKLE ARTHROSCOPY WITH RECONSTRUCTION Left 03/12/2024   Procedure: ANKLE ARTHROSCOPY WITH OPEN LATERAL ANKLE LIGAMENT STABILIZATION;  Surgeon: Barton Drape, MD;  Location: [facility];  Service: Orthopedics;  Laterality: Left;   BREAST BIOPSY Right 08/23/2012   ADH   BREAST BIOPSY Right 10/11/2012   Ductal Carcinoma   BREAST EXCISIONAL BIOPSY Right 09/04/2013   benign   BREAST LUMPECTOMY Right 10/11/2012   W/ SLN BX   CARDIAC CATHETERIZATION  09-13-2007  DR MORRIS   WELL-PRESERVED LVF/  DIFFUSE SCATTERED CORONARY CALCIFACATION AND ATHEROSCLEROSIS WITHOUT OBSTRUCTION   CARDIAC CATHETERIZATION  08-04-2010  DR MCALHANY   NON-OBSTRUCTIVE CAD/  pLAD 40%/  oLAD 30%/  mLAD 30%/  pRCA 30%/  EF 60%   CARDIOVASCULAR STRESS TEST  06-18-2012  DR McALHANY   LOW RISK NUCLEAR STUDY/  SMALL FIXED AREA OF MODERATELY DECREASED UPTAKE IN ANTEROSEPTAL WALL WHICH MAY BE ARTIFACTUAL/  NO ISCHEMIA/  EF 68%   COLONOSCOPY  09/29/2010   CYSTOSCOPY     CYSTOSCOPY WITH HYDRODISTENSION AND BIOPSY N/A 03/06/2014   Procedure: CYSTOSCOPY/HYDRODISTENSION/ INSTILATION OF MARCAINE  AND PYRIDIUM ;  Surgeon: Arlena LILLETTE Gal, MD;  Location: [facility];  Service: Urology;  Laterality: N/A;   CYSTOSCOPY/URETEROSCOPY/HOLMIUM LASER/STENT PLACEMENT Left 03/21/2023   Procedure: CYSTOSCOPY LEFT RETROGRADE PYELOGRAM, LEFT URETEROSCOPY, HOLMIUM LASER LITHOTRIPSYM, AND LEFT URETERAL STENT PLACEMENT;  Surgeon: Devere Lonni Righter, MD;  Location: [facility];  Service: Urology;  Laterality: Left;  60 MINUTES   ESOPHAGEAL MANOMETRY N/A 04/03/2016   Procedure: ESOPHAGEAL MANOMETRY (EM);  Surgeon: Elspeth Deward Naval, MD;  Location: [facility];  Service: Gastroenterology;   Laterality: N/A;   EXTRACORPOREAL SHOCK WAVE LITHOTRIPSY Left 02/06/2019   Procedure: EXTRACORPOREAL SHOCK WAVE LITHOTRIPSY (ESWL);  Surgeon: Ottelin, Mark, MD;  Location: [facility];  Service: Urology;  Laterality: Left;   NASAL SINUS SURGERY  1985   ORIF RIGHT ANKLE  FX  2006   POLYPECTOMY     REMOVAL VOCAL CORD CYST  01/2013   REPAIR OF PERONEUS BREVIS TENDON Left 03/12/2024   Procedure: REPAIR OF PERONEUS TENDON;  Surgeon: Barton Drape, MD;  Location: [facility];  Service: Orthopedics;  Laterality: Left;   RIGHT BREAST BX  08/23/2012   RIGHT HAND SURGERY  X3  LAST ONE 2009   INCLUDES  ORIF RIGHT 5TH FINGER AND REVISION TWICE   SKIN CANCER EXCISION     face,arms   TONSILLECTOMY AND ADENOIDECTOMY  AGE 20   TOTAL ABDOMINAL HYSTERECTOMY W/  BILATERAL SALPINGOOPHORECTOMY  1982   W/  APPENDECTOMY   TRANSTHORACIC ECHOCARDIOGRAM  06/24/2012   GRADE I DIASTOLIC DYSFUNCTION/  EF 55-60%/  MILD MR   UPPER GASTROINTESTINAL ENDOSCOPY     Patient Active Problem List   Diagnosis Date Noted   Pedal edema 05/20/2024   Otalgia of both ears 04/23/2024   Tinnitus of both ears 04/23/2024   Hypokalemia 12/10/2023   IDA (iron deficiency anemia) 09/12/2023   Stage 3 chronic kidney disease    Right ankle pain 07/31/2023   Common bile duct dilatation 02/13/2023   Oral herpes 02/05/2023   Elevated alkaline phosphatase level 01/25/2023   Vertigo 01/22/2023   Intertrigo 12/13/2022   Hyperkalemia 09/28/2022   Anemia 09/28/2022   Tremor 07/05/2022   RUQ pain 07/05/2022   Leukocytosis 07/05/2022   Sjogren's disease 12/12/2021   Vocal cord cyst 05/18/2021   Vitamin D  deficiency 05/18/2021   Dysuria 03/09/2021   Vitamin B12 deficiency 03/08/2021   Hyperglycemia 03/08/2021   OSA and COPD overlap syndrome 06/10/2020   Recurrent falls 05/02/2020   Statin intolerance 02/29/2020   RLS (restless legs syndrome) 11/09/2019   Recurrent sinusitis 02/25/2019   History of colonic polyp  02/25/2019   DDD (degenerative disc disease), cervical 09/17/2018   Degenerative scoliosis 04/22/2018   Primary insomnia 06/25/2017   Chronic interstitial cystitis 02/01/2017   Deviated nasal septum 05/12/2016   Hypertrophy of nasal turbinates 05/12/2016   Dysphagia    Allergic rhinitis 01/25/2016   Dyspnea    Sciatica 04/30/2015   Bilateral carpal tunnel syndrome 03/04/2015   Hyperlipidemia 03/01/2015   Pulmonary nodules 02/12/2015   External hemorrhoids 02/05/2015   Chronic night sweats 01/08/2015   Peripheral neuropathy 05/22/2014   Headache 10/13/2013   Arthralgia 08/18/2013   ANA positive 07/29/2013   Depression with anxiety 02/05/2013   Malignant neoplasm of lower-outer quadrant of right breast of female, estrogen receptor positive 01/03/2013   Asthma, allergic 08/05/2012   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   GERD (gastroesophageal reflux disease) 06/03/2012   Stricture and stenosis of esophagus 10/19/2011   Osteoporosis 04/17/2011   Fibrocystic breast disease 10/18/2010   Essential hypertension 08/25/2010   CAD in native artery 08/25/2010   Generalized anxiety disorder 08/03/2010   Bronchitis 03/10/2010   Fibromyalgia 01/11/2010    PCP: Domenica Harlene LABOR, MD   REFERRING PROVIDER: Barton Drape, MD   REFERRING DIAG: [phone] (ICD-10-CM) - Peroneal tendinitis, left leg   THERAPY DIAG:  Pain in left ankle and joints of left foot  Muscle weakness (generalized)  Other abnormalities of gait and mobility  Unsteadiness on feet  Dizziness and giddiness  Rationale for Evaluation and Treatment: Rehabilitation  ONSET DATE: 03/12/24  SUBJECTIVE:   SUBJECTIVE STATEMENT: Patient had to move/push her potted plants yesterday and her foot started to bother her yesterday afternoon. The pain is worse today. If she is consistent with her vestibular exercises she feels better, but if she does not do them in the morning she will feel loopy.   PERTINENT HISTORY: S/p Lt  ankle arthroscopy Brostrom stabilization, peroneus brevis and longus debridement and tenosynovectomy 03/12/24 See extensive PMH above   PAIN:  Are you having pain? Yes: NPRS scale: 5 Pain location: Lt plantar foot  Pain description: feels like I am stepping on stone Aggravating factors: overactivity Relieving factors: rest  PRECAUTIONS: Fall,progress per protocol   WEIGHT BEARING RESTRICTIONS: Yes LLE WBAT In ASO brace   FALLS:  Has patient fallen in last 6 months? Yes. Number of falls  2 (fell stepping up grassy hill, fell in her kitchen from turning quickly)  LIVING ENVIRONMENT: Lives with: lives with their spouse Lives in: House/apartment Stairs: Yes: Internal: 16 steps; on left going up Has following equipment at home: Single point cane  OCCUPATION: retired   PLOF: Independent  PATIENT GOALS: I want to be able to walk again.  NEXT MD VISIT: November 2025   OBJECTIVE:  Note: Objective measures were completed at Evaluation unless otherwise noted.  DIAGNOSTIC FINDINGS: none on file   PATIENT SURVEYS:  FADI: 46/104; 44% 06/26/24: FADI: 67/100; 67%  COGNITION: Overall cognitive status: Within functional limits for tasks assessed     SENSATION: See palpation  EDEMA:  Mild swelling about Lt ankle    PALPATION: Hypersensitivity to palpation about distal fibula, lateral malleolus   LOWER EXTREMITY ROM: Active ROM Right eval Left eval 05/05/24 Left  05/20/24 Left  06/03/24 Left   Hip flexion       Hip extension       Hip abduction       Hip adduction       Hip internal rotation       Hip external rotation       Knee flexion       Knee extension       Ankle dorsiflexion  6 9  10   Ankle plantarflexion  50  65 67  Ankle inversion  15  15 17   Ankle eversion  8  8 9    (Blank rows = not tested)  LOWER EXTREMITY MMT: MMT Right eval Left eval 06/26/24 Left   Hip flexion     Hip extension     Hip abduction     Hip adduction     Hip internal rotation      Hip external rotation     Knee flexion     Knee extension     Ankle dorsiflexion  3+ 4+  Ankle plantarflexion  3+ sitting 4- in sitting  Ankle inversion  3+ 4-  Ankle eversion  3+ 4   (Blank rows = not tested)  FUNCTIONAL TESTS:  TUG: 13.5 seconds Romberg x 30 seconds Semi-tandem unable   06/09/24: TUG: 11.1 seconds   06/26/24: tandem 1 second   GAIT: Distance walked: 20 ft  Assistive device utilized: Single point cane. Ankle brace Level of assistance: Modified independence Comments: limited push-off LLE, foot ER,  [facility] Adult PT Treatment:                                                DATE: 06/26/24 Therapeutic Exercise: NuStep level 5 x 5 minutes UE/LE   Neuromuscular re-ed: Romberg 2 x 30 sec  Romberg eyes closed unable  normal stance eyes closed 2 x 30 sec Romberg vertical head turns immediate dizziness when returning to neutral from flexed position so discontinued  Normal stance on foam 2 x 30 sec  Romberg on foam x 30 sec  Therapeutic Activity: Re-assessment to determine overall progress, educating patient on progress towards goals.    [facility] Adult PT Treatment:                                                DATE: 06/24/24 Therapeutic Exercise: Resisted  plantarflexion red band 2 x 10  Resisted ankle eversion/inversion 2 x 10 red band  Seated calf raise on step 5 lbs 2 x 10   Neuromuscular re-ed: Standing hip abduction 2 x 10   Therapeutic Activity: Leg press 2 x 10 @ 40 lbs   Self Care: Discussed supportive footwear options with online viewing of options and where to purchase.   [facility] Adult PT Treatment:                                                DATE: 06/16/24 PT re-eval  VESTIBULAR ASSESSMENT: GENERAL OBSERVATION: Patient arrives with [facility] and wide base of support   SYMPTOM BEHAVIOR:  Subjective history: unsteadiness and spinning  Non-Vestibular symptoms: none  Type of dizziness: Imbalance (Disequilibrium), Spinning/Vertigo, Unsteady with head/body  turns, and Lightheadedness/Faint  Frequency: daily  Duration: seconds to minutes  Aggravating factors: bending turning, rolling in bed  unable to hold her head back in the shower  Relieving factors: head stationary  Progression of symptoms: worse after recent surgery  OCULOMOTOR EXAM:  Ocular Alignment: normal  Ocular ROM: No Limitations  Spontaneous Nystagmus: absent  Gaze-Induced Nystagmus: absent  Smooth Pursuits: intact however she gets sensation of dizziness moving eyes L>R with smooth pursuits (doesn't improve with repetitions)  Saccades: intact     VESTIBULAR - OCULAR REFLEX:   Slow VOR: Comment: x 1-2 reps gets immediate sensation of out of control and sharp sensation of dizziness  VOR Cancellation: Normal, but notes neck pain and tightness  Head-Impulse Test: HIT Right: did not test due to significant reaction with slow VOR HIT Left: did not test    POSITIONAL TESTING:  Right Roll Test: provokes sensation of spinning (room moving towards the floor), but not able to view nystagmus in room light Left Roll Test: no dizziness or nystagmus Right Sidelying: trace of dizziness x 5 seconds Left Sidelying:  trace of dizziness x 5 seconds   MOTION SENSITIVITY:   Motion Sensitivity Quotient Intensity: 0 = none, 1 = Lightheaded, 2 = Mild, 3 = Moderate, 4 = Severe, 5 = Vomiting  Intensity  1. Sitting to supine 3  2. Supine to L side 0  3. Supine to R side 3  4. Supine to sitting 3  5. L Hallpike-Dix   6. Up from L    7. R Hallpike-Dix   8. Up from R    9. Sitting, head  tipped to L knee   10. Head up from L  knee   11. Sitting, head  tipped to R knee   12. Head up from R  knee   13. Sitting head turns x5 3  14.Sitting head nods x5 2  15. In stance, 180  turn to L    16. In stance, 180  turn to R     Neuromuscular re-ed: Habituation  Seated x 5 horizontal head turns provokes 3-4/10 dizziness Seated x 5 vertical head turns provokes 2/10 dizziness Rolling x 3  reps -- symptoms improve with repetition Wilhelmena daroff x 2 reps  -- symptoms improve  Smooth pursuit Horizontal plane provokes Gaze adaptation Unable to tolerate gaze activities   [facility] Adult PT Treatment:  DATE: 06/09/24 Therapeutic Exercise: Ankle circles 2 x 10 each CW/CCW Standing calf raise x 4 d/c due to pain Neuromuscular re-ed: Resisted eversion yellow band 2 x 10  Resisted inversion yellow band 2 x 10  Arch lift seated 2 x 10  Therapeutic Activity: TUG Step taps 4 inch 2 x 10     PATIENT EDUCATION:  Education details:HEP review Person educated: Patient Education method: Explanation,  Education comprehension: verbalized understanding,  HOME EXERCISE PROGRAM: Access Code: [email or web address] URL: [email or web address] Date: 06/24/2024 Prepared by: Lucie Meeter  Exercises - Long Sitting Calf Stretch with Strap  - 1 x daily - 7 x weekly - 3 sets - 30s ec  hold - Seated Ankle Circles  - 1 x daily - 7 x weekly - 2 sets - 10 reps - Isometric Ankle Eversion at Wall  - 1 x daily - 7 x weekly - 2 sets - 10 reps - Seated Plantar Fascia Mobilization with Small Ball  - 1 x daily - 7 x weekly - 3-5 minute  hold - Seated Great Toe Extension  - 1 x daily - 7 x weekly - 2 sets - 10 reps - Long Sitting Ankle Plantar Flexion with Resistance  - 1 x daily - 7 x weekly - 2 sets - 10 reps - Long Sitting Ankle Inversion with Resistance  - 1 x daily - 7 x weekly - 2 sets - 10 reps - Seated Calf Raise with Weights on Thighs  - 1 x daily - 7 x weekly - 2 sets - 10 reps - Side to Side Weight Shift with Counter Support  - 1 x daily - 7 x weekly - 2 sets - 10 reps - Staggered Stance Forward Backward Weight Shift with Counter Support  - 1 x daily - 7 x weekly - 2 sets - 10 reps - Toe Raises with Counter Support  - 1 x daily - 7 x weekly - 2 sets - 10 reps - Towel Scrunches  - 1 x daily - 7 x weekly - 1 sets - 5 reps - Rolling rightleft sides  for vestibular habituation  - 1-2 x daily - 7 x weekly - 1 sets - 3-5 reps - Seated Right Head Turns Vestibular Habituation  - 1-2 x daily - 7 x weekly - 1 sets - 5 reps - Standing Hip Abduction with Counter Support  - 1 x daily - 7 x weekly - 2 sets - 10 repss  ASSESSMENT:  CLINICAL IMPRESSION: [name] is making steady progress in PT s/p Lt ankle arthroscopy Brostrom stabilization, peroneus brevis and longus debridement and tenosynovectomy on 03/12/24. She is tolerating gradual progression of ankle strengthing, ROM, and balance training well. In regards to functional goals related to the Lt ankle she has met all STG and is making steady progress towards LTG. She continues to have strength, balance, and gait deficits that are consistent with her post-operative status. In addition, she was recently re-evaluated for complaints of dizziness and these specific goals will be assessed at appropriate time frame. She will continue to benefit from skilled PT to address the above stated deficits as it relates to her ankle and further incorporate vestibular exercises to work towards LTGs.   EVAL: Patient is a 78 y.o. female who was seen today for physical therapy evaluation and treatment for s/p Lt ankle arthroscopy Brostrom stabilization, peroneus brevis and longus debridement and tenosynovectomy on 03/12/24. She demonstrates ROM, strength, gait and balance deficits that are consistent with her recent post-operative  status. She will benefit from skilled PT to address the above stated deficits in order to return to optimal function.   OBJECTIVE IMPAIRMENTS: Abnormal gait, decreased activity tolerance, decreased balance, decreased endurance, decreased knowledge of condition, decreased mobility, difficulty walking, decreased ROM, decreased strength, increased edema, impaired flexibility, impaired sensation, improper body mechanics, postural dysfunction, and pain.   GOALS: Goals reviewed with patient? Yes  SHORT TERM  GOALS: Target date: 06/09/2024  Patient will be independent and compliant with initial HEP.  Baseline: issued at eval  Goal status: MET  2.  Patient will demonstrate at least 60 degrees of Lt ankle plantarflexion AROM to improve push-off during gait cycle.  Baseline: see above Goal status: MET  3.  Patient will demonstrate at least 10 degrees of Lt ankle dorsiflexion AROM to improve gait mechanics.  Baseline: see above Goal status: MET  4.  Patient will complete TUG in </= 12 seconds to reduce fall risk.  Baseline: see above Goal status: MET   LONG TERM GOALS: Target date: 07/26/24  Patient will score >/= 60/104 on the Foot and Ankle Disability Index (MDC 7) to signify clinically meaningful improvement in functional abilities.  Baseline: see above Goal status: MET  2.  Patient will maintain tandem stance for at least 10 seconds to improve gait stability.  Baseline: see above 06/26/24: tandem 1 second Goal status: progressing   3.  Patient will be modified independent with curb/stair negotiation.  Baseline: uses cane and requires support from spouse  06/26/24: requires cane  Goal status: MET  4.  Patient will self-report ability to tolerate at least 30 minutes of standing/walking activity.  Baseline: 10 minutes  06/26/24: 20 minutes  Goal status: progressing   5.  Patient will demonstrate at least 4/5 Lt ankle strength to improve stability when navigating on uneven terrain.  Baseline: see above  Goal status: partially met   6.  Patient will be independent with advanced home program to progress/maintain current level of function.  Baseline: initial HEP issued  Goal status: progressing    7. Patient will tolerate rolling R and L without d/o dizziness.   Baseline: 4/10 dizzines   Goal status: INITIAL   8. Patient will tolerate gaze x 1 viewing in sitting x 20 seconds to work towards improved tolerance to turns in standing.   Baseline: Unable to do 2 reps in sitting   Goal  status: INITIAL  PLAN:  PT FREQUENCY: 2x/week  PT DURATION: 12 weeks  PLANNED INTERVENTIONS: 97164- PT Re-evaluation, 97750- Physical Performance Testing, 97110-Therapeutic exercises, 97530- Therapeutic activity, [id number]- Neuromuscular re-education, 97535- Self Care, [id number]- Manual therapy, [id number]- Gait training, [id number]- Aquatic Therapy, 97016- Vasopneumatic device, Taping, Dry Needling, Cryotherapy, and Moist heat  PLAN FOR NEXT SESSION: review and progress HEP; ankle AROM and strengthening as tolerated; static balance. For vestibular: Progress habituation (seated head turns>standing head turns), add brandt daroff habituation, work on tolerance to very slow gaze (try to begin vertical plane before trying horizontal again).  Consider BBQ roll for canolith repositioning to R if symptoms remain after habituation (responded well to habituation in clinic and may not need).   Tyleek Smick, PT, DPT, ATC 06/26/24 2:45 PM

## 2024-06-26 NOTE — Telephone Encounter (Signed)
 Refills sent to pharmacy as requested.

## 2024-06-30 ENCOUNTER — Encounter: Payer: Self-pay | Admitting: Rehabilitative and Restorative Service Providers"

## 2024-06-30 ENCOUNTER — Other Ambulatory Visit (HOSPITAL_COMMUNITY): Payer: Self-pay

## 2024-06-30 ENCOUNTER — Telehealth: Payer: Self-pay

## 2024-06-30 ENCOUNTER — Ambulatory Visit: Admitting: Rehabilitative and Restorative Service Providers"

## 2024-06-30 DIAGNOSIS — M6281 Muscle weakness (generalized): Secondary | ICD-10-CM

## 2024-06-30 DIAGNOSIS — R2681 Unsteadiness on feet: Secondary | ICD-10-CM

## 2024-06-30 DIAGNOSIS — R2689 Other abnormalities of gait and mobility: Secondary | ICD-10-CM

## 2024-06-30 DIAGNOSIS — M25572 Pain in left ankle and joints of left foot: Secondary | ICD-10-CM

## 2024-06-30 NOTE — Telephone Encounter (Signed)
 Pharmacy Patient Advocate Encounter   Received notification from CoverMyMeds that prior authorization for Voquezna  10MG  tablets is required/requested.   Insurance verification completed.   The patient is insured through [employer] ADVANTAGE/RX ADVANCE .   Per test claim: PA required; PA submitted to above mentioned insurance via CoverMyMeds Key/confirmation #/EOC [id number] Status is pending

## 2024-06-30 NOTE — Therapy (Signed)
 OUTPATIENT PHYSICAL THERAPY LOWER EXTREMITY TREATMENT   Patient Name: [name] MRN: [id number] DOB:January 01, 1946, 77 y.o., female Today's Date: 06/30/2024  END OF SESSION:  PT End of Session - 06/30/24 1408     Visit Number 11    Number of Visits 25    Date for PT Re-Evaluation 07/26/24    Authorization Type [employer]    Authorization - Visit Number 11    Progress Note Due on Visit 20    PT Start Time 1404    PT Stop Time 1446    PT Time Calculation (min) 42 min    Activity Tolerance Patient tolerated treatment well    Behavior During Therapy [facility] for tasks assessed/performed          Past Medical History:  Diagnosis Date   Abdominal pain 10/26/2013   Allergic rhinitis 01/25/2016   ANA positive 07/29/2013   Anemia    Arthralgia 08/18/2013   Arthritis    Asthma, allergic 08/05/2012   Atrial flutter    past history- not current   Atypical chest pain 01/01/2015   Bilateral carpal tunnel syndrome 03/04/2015   CAD in native artery 08/25/2010   Minor CAD 2009, 2011, low risk Myoview  2013 and 2016        Cataract    bilaterally removed    Cellulitis of breast 02/05/2023   Chronic constipation    Chronic cough 01/09/2017   Chronic interstitial cystitis 02/01/2017   Chronic night sweats 01/08/2015   Common bile duct dilatation 02/13/2023   Concussion    x 3   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   DDD (degenerative disc disease), cervical 09/17/2018   Degenerative scoliosis 04/22/2018   Deviated septum 05/12/2016   Dysphagia    Dyspnea    Dysuria 03/09/2021   Elevated alkaline phosphatase level 01/25/2023   Essential hypertension 08/25/2010   External hemorrhoids 02/05/2015   Facial trauma 01/22/2023   Family history of adverse reaction to anesthesia    Family history of malignant hyperthermia    father had this   Fibrocystic breast disease 10/18/2010   Fibromyalgia    Frequency of urination    Generalized anxiety disorder 08/03/2010   GERD  (gastroesophageal reflux disease)    Headache 10/13/2013   High serum vitamin D  05/18/2021   History of chronic bronchitis    History of colonic polyp 02/25/2019   History of hiatal hernia    History of tachycardia    CONTROLLED  WITH ATENOLOL    Hyperglycemia 03/08/2021   Hyperkalemia 09/28/2022   Hyperlipidemia    Intertrigo 12/13/2022   Kidney stone 02/26/2019   Knee pain, right 04/30/2015   Left hip pain 04/30/2015   Leg pain 02/24/2011   Qualifier: Diagnosis of   By: Antonio DOJamee         Leukocytosis 07/05/2022   Local reaction to hymenoptera sting 05/09/2023   Low back pain 09/08/2020   Major depressive disorder 02/05/2013   Malignant neoplasm of lower-outer quadrant of right breast of female, estrogen receptor positive 01/03/2013   Nasal turbinate hypertrophy 05/12/2016   Oral herpes 02/05/2023   OSA and COPD overlap syndrome 06/10/2020   Osteoporosis 01/2019   T score -2.2 stable/improved from prior study   Pelvic pain    Peripheral neuropathy 05/22/2014   Personal history of radiation therapy    Pneumonia    Primary insomnia 06/25/2017   Pulmonary nodules 02/12/2015   Rash and other nonspecific skin eruption 05/09/2023   Recurrent falls 05/02/2020  Recurrent sinusitis 02/25/2019   Right arm pain 07/20/2016   RLS (restless legs syndrome) 11/09/2019   RUQ pain 07/05/2022   S/P radiation therapy 11/12/12 - 12/05/12   right Breast   Sciatica 04/30/2015   Sepsis 2014   from UTI    Sjogren's disease 12/12/2021   Splenic lesion 09/02/2012   Sprain of lateral ligament of ankle joint 07/31/2023   Stage 3 chronic kidney disease    Statin intolerance 02/29/2020   Stricture and stenosis of esophagus 10/19/2011   Tongue lesion 02/05/2023   Tremor 07/05/2022   Urgency of urination    Vertigo 01/22/2023   Vitamin B12 deficiency 03/08/2021   Vocal cord cyst 05/18/2021   Past Surgical History:  Procedure Laterality Date   24 HOUR PH STUDY N/A 04/03/2016    Procedure: 24 HOUR PH STUDY;  Surgeon: Elspeth Deward Naval, MD;  Location: [facility];  Service: Gastroenterology;  Laterality: N/A;   ANKLE ARTHROSCOPY WITH RECONSTRUCTION Left 03/12/2024   Procedure: ANKLE ARTHROSCOPY WITH OPEN LATERAL ANKLE LIGAMENT STABILIZATION;  Surgeon: Barton Drape, MD;  Location: [facility];  Service: Orthopedics;  Laterality: Left;   BREAST BIOPSY Right 08/23/2012   ADH   BREAST BIOPSY Right 10/11/2012   Ductal Carcinoma   BREAST EXCISIONAL BIOPSY Right 09/04/2013   benign   BREAST LUMPECTOMY Right 10/11/2012   W/ SLN BX   CARDIAC CATHETERIZATION  09-13-2007  DR MORRIS   WELL-PRESERVED LVF/  DIFFUSE SCATTERED CORONARY CALCIFACATION AND ATHEROSCLEROSIS WITHOUT OBSTRUCTION   CARDIAC CATHETERIZATION  08-04-2010  DR MCALHANY   NON-OBSTRUCTIVE CAD/  pLAD 40%/  oLAD 30%/  mLAD 30%/  pRCA 30%/  EF 60%   CARDIOVASCULAR STRESS TEST  06-18-2012  DR McALHANY   LOW RISK NUCLEAR STUDY/  SMALL FIXED AREA OF MODERATELY DECREASED UPTAKE IN ANTEROSEPTAL WALL WHICH MAY BE ARTIFACTUAL/  NO ISCHEMIA/  EF 68%   COLONOSCOPY  09/29/2010   CYSTOSCOPY     CYSTOSCOPY WITH HYDRODISTENSION AND BIOPSY N/A 03/06/2014   Procedure: CYSTOSCOPY/HYDRODISTENSION/ INSTILATION OF MARCAINE  AND PYRIDIUM ;  Surgeon: Arlena LILLETTE Gal, MD;  Location: [facility];  Service: Urology;  Laterality: N/A;   CYSTOSCOPY/URETEROSCOPY/HOLMIUM LASER/STENT PLACEMENT Left 03/21/2023   Procedure: CYSTOSCOPY LEFT RETROGRADE PYELOGRAM, LEFT URETEROSCOPY, HOLMIUM LASER LITHOTRIPSYM, AND LEFT URETERAL STENT PLACEMENT;  Surgeon: Devere Lonni Righter, MD;  Location: [facility];  Service: Urology;  Laterality: Left;  60 MINUTES   ESOPHAGEAL MANOMETRY N/A 04/03/2016   Procedure: ESOPHAGEAL MANOMETRY (EM);  Surgeon: Elspeth Deward Naval, MD;  Location: [facility];  Service: Gastroenterology;  Laterality: N/A;   EXTRACORPOREAL SHOCK WAVE LITHOTRIPSY Left 02/06/2019   Procedure:  EXTRACORPOREAL SHOCK WAVE LITHOTRIPSY (ESWL);  Surgeon: Ottelin, Mark, MD;  Location: [facility];  Service: Urology;  Laterality: Left;   NASAL SINUS SURGERY  1985   ORIF RIGHT ANKLE  FX  2006   POLYPECTOMY     REMOVAL VOCAL CORD CYST  01/2013   REPAIR OF PERONEUS BREVIS TENDON Left 03/12/2024   Procedure: REPAIR OF PERONEUS TENDON;  Surgeon: Barton Drape, MD;  Location: [facility];  Service: Orthopedics;  Laterality: Left;   RIGHT BREAST BX  08/23/2012   RIGHT HAND SURGERY  X3  LAST ONE 2009   INCLUDES  ORIF RIGHT 5TH FINGER AND REVISION TWICE   SKIN CANCER EXCISION     face,arms   TONSILLECTOMY AND ADENOIDECTOMY  AGE 45   TOTAL ABDOMINAL HYSTERECTOMY W/ BILATERAL SALPINGOOPHORECTOMY  1982   W/  APPENDECTOMY   TRANSTHORACIC ECHOCARDIOGRAM  06/24/2012   GRADE I DIASTOLIC DYSFUNCTION/  EF 55-60%/  MILD MR   UPPER GASTROINTESTINAL ENDOSCOPY     Patient Active Problem List   Diagnosis Date Noted   Pedal edema 05/20/2024   Otalgia of both ears 04/23/2024   Tinnitus of both ears 04/23/2024   Hypokalemia 12/10/2023   IDA (iron deficiency anemia) 09/12/2023   Stage 3 chronic kidney disease    Right ankle pain 07/31/2023   Common bile duct dilatation 02/13/2023   Oral herpes 02/05/2023   Elevated alkaline phosphatase level 01/25/2023   Vertigo 01/22/2023   Intertrigo 12/13/2022   Hyperkalemia 09/28/2022   Anemia 09/28/2022   Tremor 07/05/2022   RUQ pain 07/05/2022   Leukocytosis 07/05/2022   Sjogren's disease 12/12/2021   Vocal cord cyst 05/18/2021   Vitamin D  deficiency 05/18/2021   Dysuria 03/09/2021   Vitamin B12 deficiency 03/08/2021   Hyperglycemia 03/08/2021   OSA and COPD overlap syndrome 06/10/2020   Recurrent falls 05/02/2020   Statin intolerance 02/29/2020   RLS (restless legs syndrome) 11/09/2019   Recurrent sinusitis 02/25/2019   History of colonic polyp 02/25/2019   DDD (degenerative disc disease), cervical 09/17/2018   Degenerative scoliosis  04/22/2018   Primary insomnia 06/25/2017   Chronic interstitial cystitis 02/01/2017   Deviated nasal septum 05/12/2016   Hypertrophy of nasal turbinates 05/12/2016   Dysphagia    Allergic rhinitis 01/25/2016   Dyspnea    Sciatica 04/30/2015   Bilateral carpal tunnel syndrome 03/04/2015   Hyperlipidemia 03/01/2015   Pulmonary nodules 02/12/2015   External hemorrhoids 02/05/2015   Chronic night sweats 01/08/2015   Peripheral neuropathy 05/22/2014   Headache 10/13/2013   Arthralgia 08/18/2013   ANA positive 07/29/2013   Depression with anxiety 02/05/2013   Malignant neoplasm of lower-outer quadrant of right breast of female, estrogen receptor positive 01/03/2013   Asthma, allergic 08/05/2012   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   GERD (gastroesophageal reflux disease) 06/03/2012   Stricture and stenosis of esophagus 10/19/2011   Osteoporosis 04/17/2011   Fibrocystic breast disease 10/18/2010   Essential hypertension 08/25/2010   CAD in native artery 08/25/2010   Generalized anxiety disorder 08/03/2010   Bronchitis 03/10/2010   Fibromyalgia 01/11/2010    PCP: Domenica Harlene LABOR, MD  REFERRING PROVIDER: Barton Drape, MD  REFERRING DIAG: [phone] (ICD-10-CM) - Peroneal tendinitis, left leg  THERAPY DIAG:  Muscle weakness (generalized)  Other abnormalities of gait and mobility  Unsteadiness on feet  Pain in left ankle and joints of left foot  Rationale for Evaluation and Treatment: Rehabilitation  ONSET DATE: 03/12/24  SUBJECTIVE:   SUBJECTIVE STATEMENT: The patient reports her foot has continued to hurt since last week ( when she pushed the plantar)-- her pain is across the top of the foot (along MTP joints). She got dizzy this morning when bending forward. Today is not a good day.  PERTINENT HISTORY: S/p Lt ankle arthroscopy Brostrom stabilization, peroneus brevis and longus debridement and tenosynovectomy 03/12/24 See extensive PMH above   PAIN:  Are you having  pain? Yes: NPRS scale: 5 Pain location: Lt plantar foot  Pain description: feels like I am stepping on stone Aggravating factors: overactivity Relieving factors: rest  PRECAUTIONS: Fall,progress per protocol   WEIGHT BEARING RESTRICTIONS: Yes LLE WBAT In ASO brace   FALLS:  Has patient fallen in last 6 months? Yes. Number of falls 2 (fell stepping up grassy hill, fell in her kitchen from turning quickly)  LIVING ENVIRONMENT: Lives with: lives with their spouse Lives in: House/apartment  Stairs: Yes: Internal: 16 steps; on left going up Has following equipment at home: Single point cane  OCCUPATION: retired   PLOF: Independent  PATIENT GOALS: I want to be able to walk again.  NEXT MD VISIT: November 2025   OBJECTIVE:  Note: Objective measures were completed at Evaluation unless otherwise noted.  DIAGNOSTIC FINDINGS: none on file   PATIENT SURVEYS:  FADI: 46/104; 44% 06/26/24: FADI: 67/100; 67%  COGNITION: Overall cognitive status: Within functional limits for tasks assessed     SENSATION: See palpation  EDEMA:  Mild swelling about Lt ankle   PALPATION: Hypersensitivity to palpation about distal fibula, lateral malleolus   LOWER EXTREMITY ROM: Active ROM Right eval Left eval 05/05/24 Left  05/20/24 Left  06/03/24 Left   Hip flexion       Hip extension       Hip abduction       Hip adduction       Hip internal rotation       Hip external rotation       Knee flexion       Knee extension       Ankle dorsiflexion  6 9  10   Ankle plantarflexion  50  65 67  Ankle inversion  15  15 17   Ankle eversion  8  8 9    (Blank rows = not tested)  LOWER EXTREMITY MMT: MMT Right eval Left eval 06/26/24 Left   Hip flexion     Hip extension     Hip abduction     Hip adduction     Hip internal rotation     Hip external rotation     Knee flexion     Knee extension     Ankle dorsiflexion  3+ 4+  Ankle plantarflexion  3+ sitting 4- in sitting  Ankle inversion  3+  4-  Ankle eversion  3+ 4   (Blank rows = not tested)  FUNCTIONAL TESTS:  TUG: 13.5 seconds Romberg x 30 seconds Semi-tandem unable   06/09/24: TUG: 11.1 seconds   06/26/24: tandem 1 second   GAIT: Distance walked: 20 ft  Assistive device utilized: Single point cane. Ankle brace Level of assistance: Modified independence Comments: limited push-off LLE, foot ER,    [facility] Adult PT Treatment:                                                DATE: 06/30/24 Therapeutic Exercise: Seated Ball roll plantar surface Toe flexion/extension over ball Hamstring stretch x 30 seconds L and R - with foot supported on table Hamstring stretch x 30 seconds L and R with foot on floor Piriformis stretch x 30 seconds L and R Sit<>stand x 5 reps with wider base of support Sit<>stand with eyes closed with CGA for safety Manual Therapy: STM L soleous Gentle grade I subtallar mobilization and metatarsal mobilizations Scar tissue mobilization lateral ankle Neuromuscular re-ed: Habituation Seated horizontal head turns x 8 reps with eyes open Seated horizontal head turns x 8 reps with eyes closed Gaze adaptation X 1 viewing at slow pace x 5 reps Then worked on more ROM to R side Had patient begin with approx 20 degrees of head motion (small amplitude) and increase as she tolerated more Gait: Gait without L ankle ASO-- with patient noting significant decrease in pain after STM    [facility] Adult  PT Treatment:                                                DATE: 06/26/24 Therapeutic Exercise: NuStep level 5 x 5 minutes UE/LE  Neuromuscular re-ed: Romberg 2 x 30 sec  Romberg eyes closed unable  normal stance eyes closed 2 x 30 sec Romberg vertical head turns immediate dizziness when returning to neutral from flexed position so discontinued  Normal stance on foam 2 x 30 sec  Romberg on foam x 30 sec  Therapeutic Activity: Re-assessment to determine overall progress, educating patient on progress towards  goals.    [facility] Adult PT Treatment:                                                DATE: 06/24/24 Therapeutic Exercise: Resisted plantarflexion red band 2 x 10  Resisted ankle eversion/inversion 2 x 10 red band  Seated calf raise on step 5 lbs 2 x 10   Neuromuscular re-ed: Standing hip abduction 2 x 10   Therapeutic Activity: Leg press 2 x 10 @ 40 lbs   Self Care: Discussed supportive footwear options with online viewing of options and where to purchase.   [facility] Adult PT Treatment:                                                DATE: 06/16/24 PT re-eval  VESTIBULAR ASSESSMENT: GENERAL OBSERVATION: Patient arrives with [facility] and wide base of support   SYMPTOM BEHAVIOR:  Subjective history: unsteadiness and spinning  Non-Vestibular symptoms: none  Type of dizziness: Imbalance (Disequilibrium), Spinning/Vertigo, Unsteady with head/body turns, and Lightheadedness/Faint  Frequency: daily  Duration: seconds to minutes  Aggravating factors: bending turning, rolling in bed  unable to hold her head back in the shower  Relieving factors: head stationary  Progression of symptoms: worse after recent surgery  OCULOMOTOR EXAM:  Ocular Alignment: normal  Ocular ROM: No Limitations  Spontaneous Nystagmus: absent  Gaze-Induced Nystagmus: absent  Smooth Pursuits: intact however she gets sensation of dizziness moving eyes L>R with smooth pursuits (doesn't improve with repetitions)  Saccades: intact     VESTIBULAR - OCULAR REFLEX:   Slow VOR: Comment: x 1-2 reps gets immediate sensation of out of control and sharp sensation of dizziness  VOR Cancellation: Normal, but notes neck pain and tightness  Head-Impulse Test: HIT Right: did not test due to significant reaction with slow VOR HIT Left: did not test    POSITIONAL TESTING:  Right Roll Test: provokes sensation of spinning (room moving towards the floor), but not able to view nystagmus in room light Left Roll Test: no dizziness or  nystagmus Right Sidelying: trace of dizziness x 5 seconds Left Sidelying:  trace of dizziness x 5 seconds   MOTION SENSITIVITY:   Motion Sensitivity Quotient Intensity: 0 = none, 1 = Lightheaded, 2 = Mild, 3 = Moderate, 4 = Severe, 5 = Vomiting  Intensity  1. Sitting to supine 3  2. Supine to L side 0  3. Supine to R side 3  4. Supine to sitting 3  5.  L Hallpike-Dix   6. Up from L    7. R Hallpike-Dix   8. Up from R    9. Sitting, head  tipped to L knee   10. Head up from L  knee   11. Sitting, head  tipped to R knee   12. Head up from R  knee   13. Sitting head turns x5 3  14.Sitting head nods x5 2  15. In stance, 180  turn to L    16. In stance, 180  turn to R     Neuromuscular re-ed: Habituation  Seated x 5 horizontal head turns provokes 3-4/10 dizziness Seated x 5 vertical head turns provokes 2/10 dizziness Rolling x 3 reps -- symptoms improve with repetition Wilhelmena daroff x 2 reps  -- symptoms improve  Smooth pursuit Horizontal plane provokes Gaze adaptation Unable to tolerate gaze activities   PATIENT EDUCATION:  Education details:HEP review Person educated: Patient Education method: Explanation,  Education comprehension: verbalized understanding,  HOME EXERCISE PROGRAM: Access Code: [email or web address] URL: [email or web address] Date: 06/24/2024 Prepared by: Lucie Meeter  Exercises - Long Sitting Calf Stretch with Strap  - 1 x daily - 7 x weekly - 3 sets - 30s ec  hold - Seated Ankle Circles  - 1 x daily - 7 x weekly - 2 sets - 10 reps - Isometric Ankle Eversion at Wall  - 1 x daily - 7 x weekly - 2 sets - 10 reps - Seated Plantar Fascia Mobilization with Small Ball  - 1 x daily - 7 x weekly - 3-5 minute  hold - Seated Great Toe Extension  - 1 x daily - 7 x weekly - 2 sets - 10 reps - Long Sitting Ankle Plantar Flexion with Resistance  - 1 x daily - 7 x weekly - 2 sets - 10 reps - Long Sitting Ankle Inversion with Resistance  - 1 x daily - 7  x weekly - 2 sets - 10 reps - Seated Calf Raise with Weights on Thighs  - 1 x daily - 7 x weekly - 2 sets - 10 reps - Side to Side Weight Shift with Counter Support  - 1 x daily - 7 x weekly - 2 sets - 10 reps - Staggered Stance Forward Backward Weight Shift with Counter Support  - 1 x daily - 7 x weekly - 2 sets - 10 reps - Toe Raises with Counter Support  - 1 x daily - 7 x weekly - 2 sets - 10 reps - Towel Scrunches  - 1 x daily - 7 x weekly - 1 sets - 5 reps - Rolling rightleft sides for vestibular habituation  - 1-2 x daily - 7 x weekly - 1 sets - 3-5 reps - Seated Right Head Turns Vestibular Habituation  - 1-2 x daily - 7 x weekly - 1 sets - 5 reps - Standing Hip Abduction with Counter Support  - 1 x daily - 7 x weekly - 2 sets - 10 repss  ASSESSMENT:  CLINICAL IMPRESSION: The patient's foot pain reduced significantly after STM of the L foot. Patient was able to ambulate without ASO or cane with close supervision >160 ft. PT worked on habituation, gaze, and general mobility in addition to STM. Plan to continue working towards LTGs focusing on strength, balance, and gait to improve safety during ADLs and IADLs.  EVAL: Patient is a 78 y.o. female who was seen today for physical therapy evaluation and treatment for s/p Lt ankle arthroscopy  Brostrom stabilization, peroneus brevis and longus debridement and tenosynovectomy on 03/12/24. She demonstrates ROM, strength, gait and balance deficits that are consistent with her recent post-operative status. She will benefit from skilled PT to address the above stated deficits in order to return to optimal function.   OBJECTIVE IMPAIRMENTS: Abnormal gait, decreased activity tolerance, decreased balance, decreased endurance, decreased knowledge of condition, decreased mobility, difficulty walking, decreased ROM, decreased strength, increased edema, impaired flexibility, impaired sensation, improper body mechanics, postural dysfunction, and pain.    GOALS: Goals reviewed with patient? Yes  SHORT TERM GOALS: Target date: 06/09/2024  Patient will be independent and compliant with initial HEP.  Baseline: issued at eval  Goal status: MET  2.  Patient will demonstrate at least 60 degrees of Lt ankle plantarflexion AROM to improve push-off during gait cycle.  Baseline: see above Goal status: MET  3.  Patient will demonstrate at least 10 degrees of Lt ankle dorsiflexion AROM to improve gait mechanics.  Baseline: see above Goal status: MET  4.  Patient will complete TUG in </= 12 seconds to reduce fall risk.  Baseline: see above Goal status: MET  LONG TERM GOALS: Target date: 07/26/24  Patient will score >/= 60/104 on the Foot and Ankle Disability Index (MDC 7) to signify clinically meaningful improvement in functional abilities.  Baseline: see above Goal status: MET  2.  Patient will maintain tandem stance for at least 10 seconds to improve gait stability.  Baseline: see above 06/26/24: tandem 1 second Goal status: progressing   3.  Patient will be modified independent with curb/stair negotiation.  Baseline: uses cane and requires support from spouse  06/26/24: requires cane  Goal status: MET  4.  Patient will self-report ability to tolerate at least 30 minutes of standing/walking activity.  Baseline: 10 minutes  06/26/24: 20 minutes  Goal status: progressing   5.  Patient will demonstrate at least 4/5 Lt ankle strength to improve stability when navigating on uneven terrain.  Baseline: see above  Goal status: partially met   6.  Patient will be independent with advanced home program to progress/maintain current level of function.  Baseline: initial HEP issued  Goal status: progressing    7. Patient will tolerate rolling R and L without d/o dizziness.   Baseline: 4/10 dizzines   Goal status: INITIAL   8. Patient will tolerate gaze x 1 viewing in sitting x 20 seconds to work towards improved tolerance to turns in  standing.   Baseline: Unable to do 2 reps in sitting   Goal status: INITIAL  PLAN:  PT FREQUENCY: 2x/week  PT DURATION: 12 weeks  PLANNED INTERVENTIONS: [id number]- PT Re-evaluation, 97750- Physical Performance Testing, 97110-Therapeutic exercises, 97530- Therapeutic activity, [id number]- Neuromuscular re-education, 97535- Self Care, [id number]- Manual therapy, [id number]- Gait training, [id number]- Aquatic Therapy, [id number]- Vasopneumatic device, Taping, Dry Needling, Cryotherapy, and Moist heat  PLAN FOR NEXT SESSION: review and progress HEP; ankle AROM and strengthening as tolerated; static balance. For vestibular: Progress habituation (seated head turns>standing head turns), add brandt daroff habituation, work on gaze as tolerated in clinic with cues,  Consider BBQ roll for canolith repositioning to R if symptoms remain after habituation (responded well to habituation in clinic and may not need).   Harlow Carrizales, PT 06/30/24 2:08 PM

## 2024-07-01 NOTE — Telephone Encounter (Signed)
 Pharmacy Patient Advocate Encounter  Received notification from [facility] [employer] that Prior Authorization for Voquezna  10MG  tablets has been APPROVED from 06-30-2024 to 12-24-2024   PA #/Case ID/Reference #: [id number]

## 2024-07-02 ENCOUNTER — Ambulatory Visit (INDEPENDENT_AMBULATORY_CARE_PROVIDER_SITE_OTHER): Admitting: Professional

## 2024-07-02 ENCOUNTER — Other Ambulatory Visit: Payer: Self-pay

## 2024-07-02 ENCOUNTER — Encounter: Payer: Self-pay | Admitting: Professional

## 2024-07-02 DIAGNOSIS — F331 Major depressive disorder, recurrent, moderate: Secondary | ICD-10-CM

## 2024-07-02 DIAGNOSIS — F411 Generalized anxiety disorder: Secondary | ICD-10-CM | POA: Diagnosis not present

## 2024-07-02 DIAGNOSIS — F4310 Post-traumatic stress disorder, unspecified: Secondary | ICD-10-CM

## 2024-07-02 MED ORDER — TRAZODONE HCL 100 MG PO TABS: 100.0000 mg | ORAL_TABLET | Freq: Every day | ORAL | 0 refills | Status: DC

## 2024-07-02 NOTE — Progress Notes (Signed)
 [facility] Counselor/Therapist Progress Note  Patient ID: [name], MRN: [id number],    Date: 07/02/2024  Time Spent: 45 minutes 1-145pm   Treatment Type: Individual Therapy  Risk Assessment: Danger to Self:  No Self-injurious Behavior: No Danger to Others: No  Subjective: This session was held via video teletherapy. The patient consented to video teletherapy and was located in her home during this session. She is aware it is the responsibility of the patient to secure confidentiality on her end of the session. The provider was in a private home office for the duration of this session.    The patient arrived on time for the [name] appointment.   Issues addressed: 1-mood -doing well but does struggle with pain -pt reports that turning 78 is daunting because she doesn't want to turn 79 but that she's trying to get a hold of that 2-physical -still having problems with her foot -it will be four more months in the soft case -last week foot was so painful that PT massaged her foot and she was feeling better when she left -Fri-Sat-Sun it was a mess again -PT two times per week for head/vertigo, and once for her foot 3-social -pt is going to visit a neighbor who had back surgery to help her -neighbor feeling depressed and pt thinks she could be helpful 4-relationship with daughter -pt wishes that they lived closer -she thinks she is as close as she can be to her daughters when a year ago she felt like they were against her 5-reviewed objectives -pt has improved dramatically since first beginning treatment -pt admits she feels lighter in her head -no longer feels irritable and has more forgiveness for people  Treatment Plan Problems: anxiety, depression, family conflict Goals: Alleviate depressive symptoms Recognize, accept, and cope with depressive feelings Develop healthy thinking patterns Develop healthy interpersonal relationships (particularly  younger daughter) Reduce overall frequency, intensity, and duration of anxiety Stabilize anxiety level while increasing ability to function Enhance ability to effectively cope with full variety of stressors Learn and implement coping skills that result in a reduction of anxiety   Objectives target date for all objectives is 09/26/2024: Verbalize an understanding of the cognitive, physiological, and behavioral components of anxiety                               60     75 Learning and implement calming skills to reduce overall anxiety                       60     75 Verbalize an understanding of the role that cognitive biases play in excessive irrational worry and persistent anxiety symptoms 70     85+ Identify, challenge, and replace biased fearful self-talk  60     80+ Learn and implement problem solving strategies   70     95 Identify and engage in pleasant activities    80     90 Learning and implement personal and interpersonal skills to reduce anxiety and improve interpersonal relationships   60     85 Learn to accept limitations in life and commit to tolerating, rather than avoiding, unpleasant emotions while accomplishing meaningful goal 60 85 Identify major life conflicts from the past and present that form the basis for present anxiety      70 90+     Maintain involvement in work, family, and social activities  70    90     Reestablish a consistent sleep-wake cycle   70    75 Verbalize an accurate understanding of depression  60    85 Identify and replace thoughts that support depression  80    100 Learn and implement behavioral strategies   60    85 Verbalize an understanding and resolution of current interpersonal problems       60    85 Learn and implement decision making skills   60    75 Learn and implement conflict resolution skills to resolve interpersonal problems       60    85 Verbalize an understanding of healthy and unhealthy emotions verbalize insight into how past  relationships may be influence current experiences with depression                               60    80 Use mindfulness and acceptance strategies and increase value based behavior        60    80 Increase hopeful statements about the future.    60    85 Interventions: Engage the patient in behavioral activation Use instruction, modeling, and role-playing to build the patient 's general social, communication, and/or conflict resolution skills Use Acceptance and Commitment Therapy to help patient  accept uncomfortable realities in order to accomplish value-consistent goals Reinforce the patient 's insight into the role of her past emotional pain and present anxiety  Support the patient in following through with work, family, and social activities Teach and implement sleep hygiene practices  Teach the patient relaxation skills Assign the patient homework Discuss examples demonstrating that unrealistic worry overestimates the probability of threats and underestimates patient's ability  Assist the patient in analyzing his or her worries Help patient understand that avoidance is reinforcing  Consistent with treatment model, discuss how change in cognitive, behavioral, and interpersonal can help patient alleviate depression CBT Behavioral activation to help the patient explore the relationship, nature of the dispute Help the patient develop new interpersonal skills and relationships Conduct Problem solving therapy Teach conflict resolution skills Use a process-experiential approach Conduct ACT  Diagnosis:Major depressive disorder, recurrent episode, moderate (HCC)  Generalized anxiety disorder  PTSD (post-traumatic stress disorder)  Plan:  -meet on Tuesday, July 15, 2024 at [facility].

## 2024-07-03 ENCOUNTER — Ambulatory Visit

## 2024-07-03 DIAGNOSIS — M25572 Pain in left ankle and joints of left foot: Secondary | ICD-10-CM | POA: Diagnosis not present

## 2024-07-03 DIAGNOSIS — R2689 Other abnormalities of gait and mobility: Secondary | ICD-10-CM

## 2024-07-03 DIAGNOSIS — R2681 Unsteadiness on feet: Secondary | ICD-10-CM

## 2024-07-03 DIAGNOSIS — M6281 Muscle weakness (generalized): Secondary | ICD-10-CM

## 2024-07-03 NOTE — Therapy (Signed)
 OUTPATIENT PHYSICAL THERAPY LOWER EXTREMITY TREATMENT   Patient Name: [name] MRN: [id number] DOB:[birth year], 78 y.o., female Today's Date: 07/03/2024  END OF SESSION:  PT End of Session - 07/03/24 1355     Visit Number 12    Number of Visits 25    Date for PT Re-Evaluation 07/26/24    Authorization Type [employer]    Progress Note Due on Visit 20    PT Start Time 1356    PT Stop Time 1442    PT Time Calculation (min) 46 min    Activity Tolerance Patient tolerated treatment well    Behavior During Therapy [facility] for tasks assessed/performed           Past Medical History:  Diagnosis Date   Abdominal pain 10/26/2013   Allergic rhinitis 01/25/2016   ANA positive 07/29/2013   Anemia    Arthralgia 08/18/2013   Arthritis    Asthma, allergic 08/05/2012   Atrial flutter    past history- not current   Atypical chest pain 01/01/2015   Bilateral carpal tunnel syndrome 03/04/2015   CAD in native artery 08/25/2010   Minor CAD 2009, 2011, low risk Myoview  2013 and 2016        Cataract    bilaterally removed    Cellulitis of breast 02/05/2023   Chronic constipation    Chronic cough 01/09/2017   Chronic interstitial cystitis 02/01/2017   Chronic night sweats 01/08/2015   Common bile duct dilatation 02/13/2023   Concussion    x 3   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   DDD (degenerative disc disease), cervical 09/17/2018   Degenerative scoliosis 04/22/2018   Deviated septum 05/12/2016   Dysphagia    Dyspnea    Dysuria 03/09/2021   Elevated alkaline phosphatase level 01/25/2023   Essential hypertension 08/25/2010   External hemorrhoids 02/05/2015   Facial trauma 01/22/2023   Family history of adverse reaction to anesthesia    Family history of malignant hyperthermia    father had this   Fibrocystic breast disease 10/18/2010   Fibromyalgia    Frequency of urination    Generalized anxiety disorder 08/03/2010   GERD (gastroesophageal reflux disease)     Headache 10/13/2013   High serum vitamin D  05/18/2021   History of chronic bronchitis    History of colonic polyp 02/25/2019   History of hiatal hernia    History of tachycardia    CONTROLLED  WITH ATENOLOL    Hyperglycemia 03/08/2021   Hyperkalemia 09/28/2022   Hyperlipidemia    Intertrigo 12/13/2022   Kidney stone 02/26/2019   Knee pain, right 04/30/2015   Left hip pain 04/30/2015   Leg pain 02/24/2011   Qualifier: Diagnosis of   By: Antonio DOJamee         Leukocytosis 07/05/2022   Local reaction to hymenoptera sting 05/09/2023   Low back pain 09/08/2020   Major depressive disorder 02/05/2013   Malignant neoplasm of lower-outer quadrant of right breast of female, estrogen receptor positive 01/03/2013   Nasal turbinate hypertrophy 05/12/2016   Oral herpes 02/05/2023   OSA and COPD overlap syndrome 06/10/2020   Osteoporosis 01/2019   T score -2.2 stable/improved from prior study   Pelvic pain    Peripheral neuropathy 05/22/2014   Personal history of radiation therapy    Pneumonia    Primary insomnia 06/25/2017   Pulmonary nodules 02/12/2015   Rash and other nonspecific skin eruption 05/09/2023   Recurrent falls 05/02/2020   Recurrent sinusitis 02/25/2019   Right arm  pain 07/20/2016   RLS (restless legs syndrome) 11/09/2019   RUQ pain 07/05/2022   S/P radiation therapy 11/12/12 - 12/05/12   right Breast   Sciatica 04/30/2015   Sepsis 2014   from UTI    Sjogren's disease 12/12/2021   Splenic lesion 09/02/2012   Sprain of lateral ligament of ankle joint 07/31/2023   Stage 3 chronic kidney disease    Statin intolerance 02/29/2020   Stricture and stenosis of esophagus 10/19/2011   Tongue lesion 02/05/2023   Tremor 07/05/2022   Urgency of urination    Vertigo 01/22/2023   Vitamin B12 deficiency 03/08/2021   Vocal cord cyst 05/18/2021   Past Surgical History:  Procedure Laterality Date   24 HOUR PH STUDY N/A 04/03/2016   Procedure: 24 HOUR PH STUDY;  Surgeon:  Elspeth Deward Naval, MD;  Location: [facility];  Service: Gastroenterology;  Laterality: N/A;   ANKLE ARTHROSCOPY WITH RECONSTRUCTION Left 03/12/2024   Procedure: ANKLE ARTHROSCOPY WITH OPEN LATERAL ANKLE LIGAMENT STABILIZATION;  Surgeon: Barton Drape, MD;  Location: [facility];  Service: Orthopedics;  Laterality: Left;   BREAST BIOPSY Right 08/23/2012   ADH   BREAST BIOPSY Right 10/11/2012   Ductal Carcinoma   BREAST EXCISIONAL BIOPSY Right 09/04/2013   benign   BREAST LUMPECTOMY Right 10/11/2012   W/ SLN BX   CARDIAC CATHETERIZATION  09-13-2007  DR MORRIS   WELL-PRESERVED LVF/  DIFFUSE SCATTERED CORONARY CALCIFACATION AND ATHEROSCLEROSIS WITHOUT OBSTRUCTION   CARDIAC CATHETERIZATION  08-04-2010  DR MCALHANY   NON-OBSTRUCTIVE CAD/  pLAD 40%/  oLAD 30%/  mLAD 30%/  pRCA 30%/  EF 60%   CARDIOVASCULAR STRESS TEST  06-18-2012  DR McALHANY   LOW RISK NUCLEAR STUDY/  SMALL FIXED AREA OF MODERATELY DECREASED UPTAKE IN ANTEROSEPTAL WALL WHICH MAY BE ARTIFACTUAL/  NO ISCHEMIA/  EF 68%   COLONOSCOPY  09/29/2010   CYSTOSCOPY     CYSTOSCOPY WITH HYDRODISTENSION AND BIOPSY N/A 03/06/2014   Procedure: CYSTOSCOPY/HYDRODISTENSION/ INSTILATION OF MARCAINE  AND PYRIDIUM ;  Surgeon: Arlena LILLETTE Gal, MD;  Location: [facility];  Service: Urology;  Laterality: N/A;   CYSTOSCOPY/URETEROSCOPY/HOLMIUM LASER/STENT PLACEMENT Left 03/21/2023   Procedure: CYSTOSCOPY LEFT RETROGRADE PYELOGRAM, LEFT URETEROSCOPY, HOLMIUM LASER LITHOTRIPSYM, AND LEFT URETERAL STENT PLACEMENT;  Surgeon: Devere Lonni Righter, MD;  Location: [facility];  Service: Urology;  Laterality: Left;  60 MINUTES   ESOPHAGEAL MANOMETRY N/A 04/03/2016   Procedure: ESOPHAGEAL MANOMETRY (EM);  Surgeon: Elspeth Deward Naval, MD;  Location: [facility];  Service: Gastroenterology;  Laterality: N/A;   EXTRACORPOREAL SHOCK WAVE LITHOTRIPSY Left 02/06/2019   Procedure: EXTRACORPOREAL SHOCK WAVE LITHOTRIPSY  (ESWL);  Surgeon: Ottelin, Mark, MD;  Location: [facility];  Service: Urology;  Laterality: Left;   NASAL SINUS SURGERY  1985   ORIF RIGHT ANKLE  FX  2006   POLYPECTOMY     REMOVAL VOCAL CORD CYST  01/2013   REPAIR OF PERONEUS BREVIS TENDON Left 03/12/2024   Procedure: REPAIR OF PERONEUS TENDON;  Surgeon: Barton Drape, MD;  Location: [facility];  Service: Orthopedics;  Laterality: Left;   RIGHT BREAST BX  08/23/2012   RIGHT HAND SURGERY  X3  LAST ONE 2009   INCLUDES  ORIF RIGHT 5TH FINGER AND REVISION TWICE   SKIN CANCER EXCISION     face,arms   TONSILLECTOMY AND ADENOIDECTOMY  AGE 66   TOTAL ABDOMINAL HYSTERECTOMY W/ BILATERAL SALPINGOOPHORECTOMY  1982   W/  APPENDECTOMY   TRANSTHORACIC ECHOCARDIOGRAM  06/24/2012   GRADE I DIASTOLIC DYSFUNCTION/  EF 55-60%/  MILD MR   UPPER GASTROINTESTINAL ENDOSCOPY     Patient Active Problem List   Diagnosis Date Noted   Pedal edema 05/20/2024   Otalgia of both ears 04/23/2024   Tinnitus of both ears 04/23/2024   Hypokalemia 12/10/2023   IDA (iron deficiency anemia) 09/12/2023   Stage 3 chronic kidney disease    Right ankle pain 07/31/2023   Common bile duct dilatation 02/13/2023   Oral herpes 02/05/2023   Elevated alkaline phosphatase level 01/25/2023   Vertigo 01/22/2023   Intertrigo 12/13/2022   Hyperkalemia 09/28/2022   Anemia 09/28/2022   Tremor 07/05/2022   RUQ pain 07/05/2022   Leukocytosis 07/05/2022   Sjogren's disease 12/12/2021   Vocal cord cyst 05/18/2021   Vitamin D  deficiency 05/18/2021   Dysuria 03/09/2021   Vitamin B12 deficiency 03/08/2021   Hyperglycemia 03/08/2021   OSA and COPD overlap syndrome 06/10/2020   Recurrent falls 05/02/2020   Statin intolerance 02/29/2020   RLS (restless legs syndrome) 11/09/2019   Recurrent sinusitis 02/25/2019   History of colonic polyp 02/25/2019   DDD (degenerative disc disease), cervical 09/17/2018   Degenerative scoliosis 04/22/2018   Primary insomnia 06/25/2017    Chronic interstitial cystitis 02/01/2017   Deviated nasal septum 05/12/2016   Hypertrophy of nasal turbinates 05/12/2016   Dysphagia    Allergic rhinitis 01/25/2016   Dyspnea    Sciatica 04/30/2015   Bilateral carpal tunnel syndrome 03/04/2015   Hyperlipidemia 03/01/2015   Pulmonary nodules 02/12/2015   External hemorrhoids 02/05/2015   Chronic night sweats 01/08/2015   Peripheral neuropathy 05/22/2014   Headache 10/13/2013   Arthralgia 08/18/2013   ANA positive 07/29/2013   Depression with anxiety 02/05/2013   Malignant neoplasm of lower-outer quadrant of right breast of female, estrogen receptor positive 01/03/2013   Asthma, allergic 08/05/2012   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   GERD (gastroesophageal reflux disease) 06/03/2012   Stricture and stenosis of esophagus 10/19/2011   Osteoporosis 04/17/2011   Fibrocystic breast disease 10/18/2010   Essential hypertension 08/25/2010   CAD in native artery 08/25/2010   Generalized anxiety disorder 08/03/2010   Bronchitis 03/10/2010   Fibromyalgia 01/11/2010    PCP: Domenica Harlene LABOR, MD  REFERRING PROVIDER: Barton Drape, MD  REFERRING DIAG: [phone] (ICD-10-CM) - Peroneal tendinitis, left leg  THERAPY DIAG:  Muscle weakness (generalized)  Other abnormalities of gait and mobility  Unsteadiness on feet  Pain in left ankle and joints of left foot  Rationale for Evaluation and Treatment: Rehabilitation  ONSET DATE: 03/12/24  SUBJECTIVE:   SUBJECTIVE STATEMENT: My foot is getting better. She found her tennis shoes and has been wearing them consistently since Monday and they seem to be more supportive. Patient reports her dizziness has not been good. Has had a few episodes of dizziness with bending over and turning the head in bed. Patient describes her dizziness currently as feeling like the head is under water . She also notices some tightness in the eyes with lateral tracking.   PERTINENT HISTORY: S/p Lt ankle  arthroscopy Brostrom stabilization, peroneus brevis and longus debridement and tenosynovectomy 03/12/24 See extensive PMH above   PAIN:  Are you having pain? Yes: NPRS scale: 2 Pain location: Lt plantar foot, lateral ankle  Pain description: feels like it is hot Aggravating factors: overactivity Relieving factors: rest  PRECAUTIONS: Fall,progress per protocol   WEIGHT BEARING RESTRICTIONS: Yes LLE WBAT In ASO brace   FALLS:  Has patient fallen in last 6 months? Yes. Number of falls 2 (fell stepping  up grassy hill, fell in her kitchen from turning quickly)  LIVING ENVIRONMENT: Lives with: lives with their spouse Lives in: House/apartment Stairs: Yes: Internal: 16 steps; on left going up Has following equipment at home: Single point cane  OCCUPATION: retired   PLOF: Independent  PATIENT GOALS: I want to be able to walk again.  NEXT MD VISIT: November 2025   OBJECTIVE:  Note: Objective measures were completed at Evaluation unless otherwise noted.  DIAGNOSTIC FINDINGS: none on file   PATIENT SURVEYS:  FADI: 46/104; 44% 06/26/24: FADI: 67/100; 67%  COGNITION: Overall cognitive status: Within functional limits for tasks assessed     SENSATION: See palpation  EDEMA:  Mild swelling about Lt ankle   PALPATION: Hypersensitivity to palpation about distal fibula, lateral malleolus   LOWER EXTREMITY ROM: Active ROM Right eval Left eval 05/05/24 Left  05/20/24 Left  06/03/24 Left   Hip flexion       Hip extension       Hip abduction       Hip adduction       Hip internal rotation       Hip external rotation       Knee flexion       Knee extension       Ankle dorsiflexion  6 9  10   Ankle plantarflexion  50  65 67  Ankle inversion  15  15 17   Ankle eversion  8  8 9    (Blank rows = not tested)  LOWER EXTREMITY MMT: MMT Right eval Left eval 06/26/24 Left   Hip flexion     Hip extension     Hip abduction     Hip adduction     Hip internal rotation     Hip  external rotation     Knee flexion     Knee extension     Ankle dorsiflexion  3+ 4+  Ankle plantarflexion  3+ sitting 4- in sitting  Ankle inversion  3+ 4-  Ankle eversion  3+ 4   (Blank rows = not tested)  FUNCTIONAL TESTS:  TUG: 13.5 seconds Romberg x 30 seconds Semi-tandem unable   06/09/24: TUG: 11.1 seconds   06/26/24: tandem 1 second   GAIT: Distance walked: 20 ft  Assistive device utilized: Single point cane. Ankle brace Level of assistance: Modified independence Comments: limited push-off LLE, foot ER,  [facility] Adult PT Treatment:                                                DATE: 07/03/24 Therapeutic Exercise: NuStep level 5 x 4 minutes UE/LE  Resisted plantarflexion 2 x10; red band  Resisted ankle dorsiflexion 2 x 10; red band  HEP review   Neuromuscular re-ed: Habituation Seated horizontal head turns to the Rt with increased velocity 2 x 5 eyes open Seated nose to knee x 5 each Gaze adaption VOR x 1 horizontal; 2 x 10, slow speed     [facility] Adult PT Treatment:                                                DATE: 06/30/24 Therapeutic Exercise: Seated Ball roll plantar surface Toe flexion/extension over ball Hamstring stretch x 30 seconds L  and R - with foot supported on table Hamstring stretch x 30 seconds L and R with foot on floor Piriformis stretch x 30 seconds L and R Sit<>stand x 5 reps with wider base of support Sit<>stand with eyes closed with CGA for safety Manual Therapy: STM L soleous Gentle grade I subtallar mobilization and metatarsal mobilizations Scar tissue mobilization lateral ankle Neuromuscular re-ed: Habituation Seated horizontal head turns x 8 reps with eyes open Seated horizontal head turns x 8 reps with eyes closed Gaze adaptation X 1 viewing at slow pace x 5 reps Then worked on more ROM to R side Had patient begin with approx 20 degrees of head motion (small amplitude) and increase as she tolerated more Gait: Gait without L ankle  ASO-- with patient noting significant decrease in pain after STM    [facility] Adult PT Treatment:                                                DATE: 06/26/24 Therapeutic Exercise: NuStep level 5 x 5 minutes UE/LE  Neuromuscular re-ed: Romberg 2 x 30 sec  Romberg eyes closed unable  normal stance eyes closed 2 x 30 sec Romberg vertical head turns immediate dizziness when returning to neutral from flexed position so discontinued  Normal stance on foam 2 x 30 sec  Romberg on foam x 30 sec  Therapeutic Activity: Re-assessment to determine overall progress, educating patient on progress towards goals.    [facility] Adult PT Treatment:                                                DATE: 06/24/24 Therapeutic Exercise: Resisted plantarflexion red band 2 x 10  Resisted ankle eversion/inversion 2 x 10 red band  Seated calf raise on step 5 lbs 2 x 10   Neuromuscular re-ed: Standing hip abduction 2 x 10   Therapeutic Activity: Leg press 2 x 10 @ 40 lbs   Self Care: Discussed supportive footwear options with online viewing of options and where to purchase.   [facility] Adult PT Treatment:                                                DATE: 06/16/24 PT re-eval  VESTIBULAR ASSESSMENT: GENERAL OBSERVATION: Patient arrives with [facility] and wide base of support   SYMPTOM BEHAVIOR:  Subjective history: unsteadiness and spinning  Non-Vestibular symptoms: none  Type of dizziness: Imbalance (Disequilibrium), Spinning/Vertigo, Unsteady with head/body turns, and Lightheadedness/Faint  Frequency: daily  Duration: seconds to minutes  Aggravating factors: bending turning, rolling in bed  unable to hold her head back in the shower  Relieving factors: head stationary  Progression of symptoms: worse after recent surgery  OCULOMOTOR EXAM:  Ocular Alignment: normal  Ocular ROM: No Limitations  Spontaneous Nystagmus: absent  Gaze-Induced Nystagmus: absent  Smooth Pursuits: intact however she gets sensation of  dizziness moving eyes L>R with smooth pursuits (doesn't improve with repetitions)  Saccades: intact     VESTIBULAR - OCULAR REFLEX:   Slow VOR: Comment: x 1-2 reps gets immediate sensation of out of control and  sharp sensation of dizziness  VOR Cancellation: Normal, but notes neck pain and tightness  Head-Impulse Test: HIT Right: did not test due to significant reaction with slow VOR HIT Left: did not test    POSITIONAL TESTING:  Right Roll Test: provokes sensation of spinning (room moving towards the floor), but not able to view nystagmus in room light Left Roll Test: no dizziness or nystagmus Right Sidelying: trace of dizziness x 5 seconds Left Sidelying:  trace of dizziness x 5 seconds   MOTION SENSITIVITY:   Motion Sensitivity Quotient Intensity: 0 = none, 1 = Lightheaded, 2 = Mild, 3 = Moderate, 4 = Severe, 5 = Vomiting  Intensity  1. Sitting to supine 3  2. Supine to L side 0  3. Supine to R side 3  4. Supine to sitting 3  5. L Hallpike-Dix   6. Up from L    7. R Hallpike-Dix   8. Up from R    9. Sitting, head  tipped to L knee   10. Head up from L  knee   11. Sitting, head  tipped to R knee   12. Head up from R  knee   13. Sitting head turns x5 3  14.Sitting head nods x5 2  15. In stance, 180  turn to L    16. In stance, 180  turn to R     Neuromuscular re-ed: Habituation  Seated x 5 horizontal head turns provokes 3-4/10 dizziness Seated x 5 vertical head turns provokes 2/10 dizziness Rolling x 3 reps -- symptoms improve with repetition Wilhelmena daroff x 2 reps  -- symptoms improve  Smooth pursuit Horizontal plane provokes Gaze adaptation Unable to tolerate gaze activities   PATIENT EDUCATION:  Education details:HEP review Person educated: Patient Education method: Explanation,  Education comprehension: verbalized understanding,  HOME EXERCISE PROGRAM: Access Code: [email or web address] URL: [email or web address] Date: 06/24/2024 Prepared by:  Lucie Meeter  Exercises - Long Sitting Calf Stretch with Strap  - 1 x daily - 7 x weekly - 3 sets - 30s ec  hold - Seated Ankle Circles  - 1 x daily - 7 x weekly - 2 sets - 10 reps - Isometric Ankle Eversion at Wall  - 1 x daily - 7 x weekly - 2 sets - 10 reps - Seated Plantar Fascia Mobilization with Small Ball  - 1 x daily - 7 x weekly - 3-5 minute  hold - Seated Great Toe Extension  - 1 x daily - 7 x weekly - 2 sets - 10 reps - Long Sitting Ankle Plantar Flexion with Resistance  - 1 x daily - 7 x weekly - 2 sets - 10 reps - Long Sitting Ankle Inversion with Resistance  - 1 x daily - 7 x weekly - 2 sets - 10 reps - Seated Calf Raise with Weights on Thighs  - 1 x daily - 7 x weekly - 2 sets - 10 reps - Side to Side Weight Shift with Counter Support  - 1 x daily - 7 x weekly - 2 sets - 10 reps - Staggered Stance Forward Backward Weight Shift with Counter Support  - 1 x daily - 7 x weekly - 2 sets - 10 reps - Toe Raises with Counter Support  - 1 x daily - 7 x weekly - 2 sets - 10 reps - Towel Scrunches  - 1 x daily - 7 x weekly - 1 sets - 5 reps - Rolling rightleft sides for vestibular habituation  -  1-2 x daily - 7 x weekly - 1 sets - 3-5 reps - Seated Right Head Turns Vestibular Habituation  - 1-2 x daily - 7 x weekly - 1 sets - 5 reps - Standing Hip Abduction with Counter Support  - 1 x daily - 7 x weekly - 2 sets - 10 repss  ASSESSMENT:  CLINICAL IMPRESSION: Further habituation and gaze adaptation exercises completed with good tolerance. Was able to complete horizontal VOR x 1 at slow speed with minimal dizziness reported. With Rt head turns she reported immediate onset of dizziness rated as 5/10. With continued reps her dizziness does improve and we were able to increase speed. With bending activity (nose to knee) she reports dizziness as 5/10 initially to the Rt that also improves with continued reps. No dizziness provoked to the Lt. Following these exercises she reported overall  improvement in dizzy symptoms compared to beginning of session.Gentle progression of ankle strengthening completed without an increase in pain reported.    EVAL: Patient is a 78 y.o. female who was seen today for physical therapy evaluation and treatment for s/p Lt ankle arthroscopy Brostrom stabilization, peroneus brevis and longus debridement and tenosynovectomy on 03/12/24. She demonstrates ROM, strength, gait and balance deficits that are consistent with her recent post-operative status. She will benefit from skilled PT to address the above stated deficits in order to return to optimal function.   OBJECTIVE IMPAIRMENTS: Abnormal gait, decreased activity tolerance, decreased balance, decreased endurance, decreased knowledge of condition, decreased mobility, difficulty walking, decreased ROM, decreased strength, increased edema, impaired flexibility, impaired sensation, improper body mechanics, postural dysfunction, and pain.   GOALS: Goals reviewed with patient? Yes  SHORT TERM GOALS: Target date: 06/09/2024  Patient will be independent and compliant with initial HEP.  Baseline: issued at eval  Goal status: MET  2.  Patient will demonstrate at least 60 degrees of Lt ankle plantarflexion AROM to improve push-off during gait cycle.  Baseline: see above Goal status: MET  3.  Patient will demonstrate at least 10 degrees of Lt ankle dorsiflexion AROM to improve gait mechanics.  Baseline: see above Goal status: MET  4.  Patient will complete TUG in </= 12 seconds to reduce fall risk.  Baseline: see above Goal status: MET  LONG TERM GOALS: Target date: 07/26/24  Patient will score >/= 60/104 on the Foot and Ankle Disability Index (MDC 7) to signify clinically meaningful improvement in functional abilities.  Baseline: see above Goal status: MET  2.  Patient will maintain tandem stance for at least 10 seconds to improve gait stability.  Baseline: see above 06/26/24: tandem 1 second Goal  status: progressing   3.  Patient will be modified independent with curb/stair negotiation.  Baseline: uses cane and requires support from spouse  06/26/24: requires cane  Goal status: MET  4.  Patient will self-report ability to tolerate at least 30 minutes of standing/walking activity.  Baseline: 10 minutes  06/26/24: 20 minutes  Goal status: progressing   5.  Patient will demonstrate at least 4/5 Lt ankle strength to improve stability when navigating on uneven terrain.  Baseline: see above  Goal status: partially met   6.  Patient will be independent with advanced home program to progress/maintain current level of function.  Baseline: initial HEP issued  Goal status: progressing    7. Patient will tolerate rolling R and L without d/o dizziness.   Baseline: 4/10 dizzines   Goal status: INITIAL   8. Patient will tolerate gaze x 1  viewing in sitting x 20 seconds to work towards improved tolerance to turns in standing.   Baseline: Unable to do 2 reps in sitting   Goal status: INITIAL  PLAN:  PT FREQUENCY: 2x/week  PT DURATION: 12 weeks  PLANNED INTERVENTIONS: 97164- PT Re-evaluation, 97750- Physical Performance Testing, 97110-Therapeutic exercises, 97530- Therapeutic activity, [id number]- Neuromuscular re-education, 97535- Self Care, [id number]- Manual therapy, [id number]- Gait training, [id number]- Aquatic Therapy, [id number]- Vasopneumatic device, Taping, Dry Needling, Cryotherapy, and Moist heat  PLAN FOR NEXT SESSION: review and progress HEP; ankle AROM and strengthening as tolerated; static balance. For vestibular: Progress habituation (seated head turns>standing head turns), add brandt daroff habituation, work on gaze as tolerated in clinic with cues,  Consider BBQ roll for canolith repositioning to R if symptoms remain after habituation (responded well to habituation in clinic and may not need). Convergence?  Domenique Quest, PT, DPT, ATC 07/03/24 2:44 PM

## 2024-07-04 MED ORDER — VOQUEZNA 10 MG PO TABS: 1.0000 | ORAL_TABLET | Freq: Every day | ORAL | 1 refills | Status: DC

## 2024-07-04 NOTE — Telephone Encounter (Signed)
 PT is calling with concern about the vocquezna. It is very expensive and she wants to know is this something she will need to be on indefinitely because if not she would like an alternative. Requesting a call back

## 2024-07-04 NOTE — Addendum Note (Signed)
 Addended by: Pura Picinich M on: 07/04/2024 01:56 PM   Modules accepted: Orders

## 2024-07-04 NOTE — Telephone Encounter (Signed)
 Called patient.  She said Voquezna  is over $200 but it works so well for her she wants to know if there is a way to get it cheaper. GoodRx was even more expensive. Will send script to [facility] pharmacy to see if they have any manufacturer savings options

## 2024-07-06 ENCOUNTER — Other Ambulatory Visit: Payer: Self-pay | Admitting: Family Medicine

## 2024-07-06 DIAGNOSIS — E785 Hyperlipidemia, unspecified: Secondary | ICD-10-CM

## 2024-07-06 DIAGNOSIS — I2510 Atherosclerotic heart disease of native coronary artery without angina pectoris: Secondary | ICD-10-CM

## 2024-07-07 ENCOUNTER — Ambulatory Visit: Payer: Self-pay | Admitting: Rehabilitative and Restorative Service Providers"

## 2024-07-07 DIAGNOSIS — R42 Dizziness and giddiness: Secondary | ICD-10-CM

## 2024-07-07 DIAGNOSIS — M25572 Pain in left ankle and joints of left foot: Secondary | ICD-10-CM | POA: Diagnosis not present

## 2024-07-07 DIAGNOSIS — M6281 Muscle weakness (generalized): Secondary | ICD-10-CM

## 2024-07-07 DIAGNOSIS — R2689 Other abnormalities of gait and mobility: Secondary | ICD-10-CM

## 2024-07-07 DIAGNOSIS — R2681 Unsteadiness on feet: Secondary | ICD-10-CM

## 2024-07-07 NOTE — Therapy (Signed)
 OUTPATIENT PHYSICAL THERAPY LOWER EXTREMITY TREATMENT   Patient Name: [name] MRN: [id number] DOB:1946/06/10, 78 y.o., female Today's Date: 07/07/2024  END OF SESSION:  PT End of Session - 07/07/24 1103     Visit Number 13    Number of Visits 25    Date for PT Re-Evaluation 07/26/24    Authorization Type [employer]    Authorization - Visit Number 13    Progress Note Due on Visit 20    PT Start Time 1103    PT Stop Time 1145    PT Time Calculation (min) 42 min    Activity Tolerance Patient tolerated treatment well    Behavior During Therapy [facility] for tasks assessed/performed          Past Medical History:  Diagnosis Date   Abdominal pain 10/26/2013   Allergic rhinitis 01/25/2016   ANA positive 07/29/2013   Anemia    Arthralgia 08/18/2013   Arthritis    Asthma, allergic 08/05/2012   Atrial flutter    past history- not current   Atypical chest pain 01/01/2015   Bilateral carpal tunnel syndrome 03/04/2015   CAD in native artery 08/25/2010   Minor CAD 2009, 2011, low risk Myoview  2013 and 2016        Cataract    bilaterally removed    Cellulitis of breast 02/05/2023   Chronic constipation    Chronic cough 01/09/2017   Chronic interstitial cystitis 02/01/2017   Chronic night sweats 01/08/2015   Common bile duct dilatation 02/13/2023   Concussion    x 3   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   DDD (degenerative disc disease), cervical 09/17/2018   Degenerative scoliosis 04/22/2018   Deviated septum 05/12/2016   Dysphagia    Dyspnea    Dysuria 03/09/2021   Elevated alkaline phosphatase level 01/25/2023   Essential hypertension 08/25/2010   External hemorrhoids 02/05/2015   Facial trauma 01/22/2023   Family history of adverse reaction to anesthesia    Family history of malignant hyperthermia    father had this   Fibrocystic breast disease 10/18/2010   Fibromyalgia    Frequency of urination    Generalized anxiety disorder 08/03/2010   GERD  (gastroesophageal reflux disease)    Headache 10/13/2013   High serum vitamin D  05/18/2021   History of chronic bronchitis    History of colonic polyp 02/25/2019   History of hiatal hernia    History of tachycardia    CONTROLLED  WITH ATENOLOL    Hyperglycemia 03/08/2021   Hyperkalemia 09/28/2022   Hyperlipidemia    Intertrigo 12/13/2022   Kidney stone 02/26/2019   Knee pain, right 04/30/2015   Left hip pain 04/30/2015   Leg pain 02/24/2011   Qualifier: Diagnosis of   By: Antonio DOJamee         Leukocytosis 07/05/2022   Local reaction to hymenoptera sting 05/09/2023   Low back pain 09/08/2020   Major depressive disorder 02/05/2013   Malignant neoplasm of lower-outer quadrant of right breast of female, estrogen receptor positive 01/03/2013   Nasal turbinate hypertrophy 05/12/2016   Oral herpes 02/05/2023   OSA and COPD overlap syndrome 06/10/2020   Osteoporosis 01/2019   T score -2.2 stable/improved from prior study   Pelvic pain    Peripheral neuropathy 05/22/2014   Personal history of radiation therapy    Pneumonia    Primary insomnia 06/25/2017   Pulmonary nodules 02/12/2015   Rash and other nonspecific skin eruption 05/09/2023   Recurrent falls 05/02/2020  Recurrent sinusitis 02/25/2019   Right arm pain 07/20/2016   RLS (restless legs syndrome) 11/09/2019   RUQ pain 07/05/2022   S/P radiation therapy 11/12/12 - 12/05/12   right Breast   Sciatica 04/30/2015   Sepsis 2014   from UTI    Sjogren's disease 12/12/2021   Splenic lesion 09/02/2012   Sprain of lateral ligament of ankle joint 07/31/2023   Stage 3 chronic kidney disease    Statin intolerance 02/29/2020   Stricture and stenosis of esophagus 10/19/2011   Tongue lesion 02/05/2023   Tremor 07/05/2022   Urgency of urination    Vertigo 01/22/2023   Vitamin B12 deficiency 03/08/2021   Vocal cord cyst 05/18/2021   Past Surgical History:  Procedure Laterality Date   24 HOUR PH STUDY N/A 04/03/2016    Procedure: 24 HOUR PH STUDY;  Surgeon: Elspeth Deward Naval, MD;  Location: [facility];  Service: Gastroenterology;  Laterality: N/A;   ANKLE ARTHROSCOPY WITH RECONSTRUCTION Left 03/12/2024   Procedure: ANKLE ARTHROSCOPY WITH OPEN LATERAL ANKLE LIGAMENT STABILIZATION;  Surgeon: Barton Drape, MD;  Location: [facility];  Service: Orthopedics;  Laterality: Left;   BREAST BIOPSY Right 08/23/2012   ADH   BREAST BIOPSY Right 10/11/2012   Ductal Carcinoma   BREAST EXCISIONAL BIOPSY Right 09/04/2013   benign   BREAST LUMPECTOMY Right 10/11/2012   W/ SLN BX   CARDIAC CATHETERIZATION  09-13-2007  DR MORRIS   WELL-PRESERVED LVF/  DIFFUSE SCATTERED CORONARY CALCIFACATION AND ATHEROSCLEROSIS WITHOUT OBSTRUCTION   CARDIAC CATHETERIZATION  08-04-2010  DR MCALHANY   NON-OBSTRUCTIVE CAD/  pLAD 40%/  oLAD 30%/  mLAD 30%/  pRCA 30%/  EF 60%   CARDIOVASCULAR STRESS TEST  06-18-2012  DR McALHANY   LOW RISK NUCLEAR STUDY/  SMALL FIXED AREA OF MODERATELY DECREASED UPTAKE IN ANTEROSEPTAL WALL WHICH MAY BE ARTIFACTUAL/  NO ISCHEMIA/  EF 68%   COLONOSCOPY  09/29/2010   CYSTOSCOPY     CYSTOSCOPY WITH HYDRODISTENSION AND BIOPSY N/A 03/06/2014   Procedure: CYSTOSCOPY/HYDRODISTENSION/ INSTILATION OF MARCAINE  AND PYRIDIUM ;  Surgeon: Arlena LILLETTE Gal, MD;  Location: [facility];  Service: Urology;  Laterality: N/A;   CYSTOSCOPY/URETEROSCOPY/HOLMIUM LASER/STENT PLACEMENT Left 03/21/2023   Procedure: CYSTOSCOPY LEFT RETROGRADE PYELOGRAM, LEFT URETEROSCOPY, HOLMIUM LASER LITHOTRIPSYM, AND LEFT URETERAL STENT PLACEMENT;  Surgeon: Devere Lonni Righter, MD;  Location: [facility];  Service: Urology;  Laterality: Left;  60 MINUTES   ESOPHAGEAL MANOMETRY N/A 04/03/2016   Procedure: ESOPHAGEAL MANOMETRY (EM);  Surgeon: Elspeth Deward Naval, MD;  Location: [facility];  Service: Gastroenterology;  Laterality: N/A;   EXTRACORPOREAL SHOCK WAVE LITHOTRIPSY Left 02/06/2019   Procedure:  EXTRACORPOREAL SHOCK WAVE LITHOTRIPSY (ESWL);  Surgeon: Ottelin, Mark, MD;  Location: [facility];  Service: Urology;  Laterality: Left;   NASAL SINUS SURGERY  1985   ORIF RIGHT ANKLE  FX  2006   POLYPECTOMY     REMOVAL VOCAL CORD CYST  01/2013   REPAIR OF PERONEUS BREVIS TENDON Left 03/12/2024   Procedure: REPAIR OF PERONEUS TENDON;  Surgeon: Barton Drape, MD;  Location: [facility];  Service: Orthopedics;  Laterality: Left;   RIGHT BREAST BX  08/23/2012   RIGHT HAND SURGERY  X3  LAST ONE 2009   INCLUDES  ORIF RIGHT 5TH FINGER AND REVISION TWICE   SKIN CANCER EXCISION     face,arms   TONSILLECTOMY AND ADENOIDECTOMY  AGE 66   TOTAL ABDOMINAL HYSTERECTOMY W/ BILATERAL SALPINGOOPHORECTOMY  1982   W/  APPENDECTOMY   TRANSTHORACIC ECHOCARDIOGRAM  06/24/2012   GRADE I DIASTOLIC DYSFUNCTION/  EF 55-60%/  MILD MR   UPPER GASTROINTESTINAL ENDOSCOPY     Patient Active Problem List   Diagnosis Date Noted   Pedal edema 05/20/2024   Otalgia of both ears 04/23/2024   Tinnitus of both ears 04/23/2024   Hypokalemia 12/10/2023   IDA (iron deficiency anemia) 09/12/2023   Stage 3 chronic kidney disease    Right ankle pain 07/31/2023   Common bile duct dilatation 02/13/2023   Oral herpes 02/05/2023   Elevated alkaline phosphatase level 01/25/2023   Vertigo 01/22/2023   Intertrigo 12/13/2022   Hyperkalemia 09/28/2022   Anemia 09/28/2022   Tremor 07/05/2022   RUQ pain 07/05/2022   Leukocytosis 07/05/2022   Sjogren's disease 12/12/2021   Vocal cord cyst 05/18/2021   Vitamin D  deficiency 05/18/2021   Dysuria 03/09/2021   Vitamin B12 deficiency 03/08/2021   Hyperglycemia 03/08/2021   OSA and COPD overlap syndrome 06/10/2020   Recurrent falls 05/02/2020   Statin intolerance 02/29/2020   RLS (restless legs syndrome) 11/09/2019   Recurrent sinusitis 02/25/2019   History of colonic polyp 02/25/2019   DDD (degenerative disc disease), cervical 09/17/2018   Degenerative scoliosis  04/22/2018   Primary insomnia 06/25/2017   Chronic interstitial cystitis 02/01/2017   Deviated nasal septum 05/12/2016   Hypertrophy of nasal turbinates 05/12/2016   Dysphagia    Allergic rhinitis 01/25/2016   Dyspnea    Sciatica 04/30/2015   Bilateral carpal tunnel syndrome 03/04/2015   Hyperlipidemia 03/01/2015   Pulmonary nodules 02/12/2015   External hemorrhoids 02/05/2015   Chronic night sweats 01/08/2015   Peripheral neuropathy 05/22/2014   Headache 10/13/2013   Arthralgia 08/18/2013   ANA positive 07/29/2013   Depression with anxiety 02/05/2013   Malignant neoplasm of lower-outer quadrant of right breast of female, estrogen receptor positive 01/03/2013   Asthma, allergic 08/05/2012   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   GERD (gastroesophageal reflux disease) 06/03/2012   Stricture and stenosis of esophagus 10/19/2011   Osteoporosis 04/17/2011   Fibrocystic breast disease 10/18/2010   Essential hypertension 08/25/2010   CAD in native artery 08/25/2010   Generalized anxiety disorder 08/03/2010   Bronchitis 03/10/2010   Fibromyalgia 01/11/2010   PCP: Domenica Harlene LABOR, MD  REFERRING PROVIDER: Barton Drape, MD  REFERRING DIAG: [phone] (ICD-10-CM) - Peroneal tendinitis, left leg  THERAPY DIAG:  Muscle weakness (generalized)  Other abnormalities of gait and mobility  Unsteadiness on feet  Pain in left ankle and joints of left foot  Dizziness and giddiness  Rationale for Evaluation and Treatment: Rehabilitation  ONSET DATE: 03/12/24  SUBJECTIVE:  SUBJECTIVE STATEMENT: The patient reports Mondays are hard because she was active yesterday going to church, Sunday school, lunch and then dinner. She had increased dizziness and increased ankle pain yesterday.  She has a back injection scheduled for tomorrow morning. She discussed that she had to turn off the vanity lights this morning due to sensitivity-- we discussed her h/o migraines and this could be a migraine  symptom.  PERTINENT HISTORY: S/p Lt ankle arthroscopy Brostrom stabilization, peroneus brevis and longus debridement and tenosynovectomy 03/12/24 See extensive PMH above   PAIN:  Are you having pain? Yes: NPRS scale: 4/10 Pain location: Lt plantar foot, lateral ankle  Pain description: feels like it is hot Aggravating factors: overactivity Relieving factors: rest  PRECAUTIONS: Fall,progress per protocol   WEIGHT BEARING RESTRICTIONS: Yes LLE WBAT In ASO brace   FALLS:  Has patient fallen in last 6 months? Yes. Number of falls  2 (fell stepping up grassy hill, fell in her kitchen from turning quickly)  LIVING ENVIRONMENT: Lives with: lives with their spouse Lives in: House/apartment Stairs: Yes: Internal: 16 steps; on left going up Has following equipment at home: Single point cane  PATIENT GOALS: I want to be able to walk again.  NEXT MD VISIT: November 2025   OBJECTIVE:  Note: Objective measures were completed at Evaluation unless otherwise noted.  DIAGNOSTIC FINDINGS: none on file   PATIENT SURVEYS:  FADI: 46/104; 44% 06/26/24: FADI: 67/100; 67%  COGNITION: Overall cognitive status: Within functional limits for tasks assessed     SENSATION: See palpation  EDEMA:  Mild swelling about Lt ankle   PALPATION: Hypersensitivity to palpation about distal fibula, lateral malleolus   LOWER EXTREMITY ROM: Active ROM Right eval Left eval 05/05/24 Left  05/20/24 Left  06/03/24 Left   Hip flexion       Hip extension       Hip abduction       Hip adduction       Hip internal rotation       Hip external rotation       Knee flexion       Knee extension       Ankle dorsiflexion  6 9  10   Ankle plantarflexion  50  65 67  Ankle inversion  15  15 17   Ankle eversion  8  8 9    (Blank rows = not tested)  LOWER EXTREMITY MMT: MMT Right eval Left eval 06/26/24 Left   Hip flexion     Hip extension     Hip abduction     Hip adduction     Hip internal rotation      Hip external rotation     Knee flexion     Knee extension     Ankle dorsiflexion  3+ 4+  Ankle plantarflexion  3+ sitting 4- in sitting  Ankle inversion  3+ 4-  Ankle eversion  3+ 4   (Blank rows = not tested)  FUNCTIONAL TESTS:  TUG: 13.5 seconds Romberg x 30 seconds Semi-tandem unable   06/09/24: TUG: 11.1 seconds   06/26/24: tandem 1 second   GAIT: Distance walked: 20 ft  Assistive device utilized: Single point cane. Ankle brace Level of assistance: Modified independence Comments: limited push-off LLE, foot ER,   [facility] Adult PT Treatment:                                                DATE: 07/07/24 Dizziness=4-5/10 all around everything doesn't seem clear Therapeutic Exercise: Supine AROM neck rotation  Standing Rocking anteriorly with each LE supported on 8 surface Step ups into 4 leading with L LE x 10 reps Step ups laterally 4 step with L LE x 10 reps Mini heel raise-- within tolerable range of motion x 6 reps Manual Therapy: Gentle cervical traction and PROM due to dizziness and reports of neck tension and HA Ankle metatarsal grade II joint mobilization Subtalar mobilization grade I Neuromuscular re-ed: With neck AROM, patient gets a sensation of her eyes flashing PT recommended she keep eyes closed during activity today due to increased light sensitivity Single limb standing dec'ing UE support with CGA for safety Oculomotor Convergence to approximately 7inches with sensation of eye strain Habituation Gets dizziness to R only with neck AROM rotation that resolves  within 3 repetitions    [facility] Adult PT Treatment:                                                DATE: 07/03/24 Therapeutic Exercise: NuStep level 5 x 4 minutes UE/LE  Resisted plantarflexion 2 x10; red band  Resisted ankle dorsiflexion 2 x 10; red band  HEP review   Neuromuscular re-ed: Habituation Seated horizontal head turns to the Rt with increased velocity 2 x 5 eyes open Seated nose to  knee x 5 each Gaze adaption VOR x 1 horizontal; 2 x 10, slow speed     [facility] Adult PT Treatment:                                                DATE: 06/30/24 Therapeutic Exercise: Seated Ball roll plantar surface Toe flexion/extension over ball Hamstring stretch x 30 seconds L and R - with foot supported on table Hamstring stretch x 30 seconds L and R with foot on floor Piriformis stretch x 30 seconds L and R Sit<>stand x 5 reps with wider base of support Sit<>stand with eyes closed with CGA for safety Manual Therapy: STM L soleous Gentle grade I subtallar mobilization and metatarsal mobilizations Scar tissue mobilization lateral ankle Neuromuscular re-ed: Habituation Seated horizontal head turns x 8 reps with eyes open Seated horizontal head turns x 8 reps with eyes closed Gaze adaptation X 1 viewing at slow pace x 5 reps Then worked on more ROM to R side Had patient begin with approx 20 degrees of head motion (small amplitude) and increase as she tolerated more Gait: Gait without L ankle ASO-- with patient noting significant decrease in pain after STM    [facility] Adult PT Treatment:                                                DATE: 06/26/24 Therapeutic Exercise: NuStep level 5 x 5 minutes UE/LE  Neuromuscular re-ed: Romberg 2 x 30 sec  Romberg eyes closed unable  normal stance eyes closed 2 x 30 sec Romberg vertical head turns immediate dizziness when returning to neutral from flexed position so discontinued  Normal stance on foam 2 x 30 sec  Romberg on foam x 30 sec  Therapeutic Activity: Re-assessment to determine overall progress, educating patient on progress towards goals.    [facility] Adult PT Treatment:                                                DATE: 06/24/24 Therapeutic Exercise: Resisted plantarflexion red band 2 x 10  Resisted ankle eversion/inversion 2 x 10 red band  Seated calf raise on step 5 lbs 2 x 10   Neuromuscular re-ed: Standing hip abduction 2 x 10    Therapeutic Activity: Leg press 2 x 10 @ 40 lbs   Self Care: Discussed supportive footwear options with online viewing of options and where to purchase.   [facility] Adult PT  Treatment:                                                DATE: 06/16/24 PT re-eval  VESTIBULAR ASSESSMENT: GENERAL OBSERVATION: Patient arrives with [facility] and wide base of support   SYMPTOM BEHAVIOR:  Subjective history: unsteadiness and spinning  Non-Vestibular symptoms: none  Type of dizziness: Imbalance (Disequilibrium), Spinning/Vertigo, Unsteady with head/body turns, and Lightheadedness/Faint  Frequency: daily  Duration: seconds to minutes  Aggravating factors: bending turning, rolling in bed  unable to hold her head back in the shower  Relieving factors: head stationary  Progression of symptoms: worse after recent surgery  OCULOMOTOR EXAM:  Ocular Alignment: normal  Ocular ROM: No Limitations  Spontaneous Nystagmus: absent  Gaze-Induced Nystagmus: absent  Smooth Pursuits: intact however she gets sensation of dizziness moving eyes L>R with smooth pursuits (doesn't improve with repetitions)  Saccades: intact     VESTIBULAR - OCULAR REFLEX:   Slow VOR: Comment: x 1-2 reps gets immediate sensation of out of control and sharp sensation of dizziness  VOR Cancellation: Normal, but notes neck pain and tightness  Head-Impulse Test: HIT Right: did not test due to significant reaction with slow VOR HIT Left: did not test    POSITIONAL TESTING:  Right Roll Test: provokes sensation of spinning (room moving towards the floor), but not able to view nystagmus in room light Left Roll Test: no dizziness or nystagmus Right Sidelying: trace of dizziness x 5 seconds Left Sidelying:  trace of dizziness x 5 seconds   MOTION SENSITIVITY:   Motion Sensitivity Quotient Intensity: 0 = none, 1 = Lightheaded, 2 = Mild, 3 = Moderate, 4 = Severe, 5 = Vomiting  Intensity  1. Sitting to supine 3  2. Supine to L side 0  3.  Supine to R side 3  4. Supine to sitting 3  5. L Hallpike-Dix   6. Up from L    7. R Hallpike-Dix   8. Up from R    9. Sitting, head  tipped to L knee   10. Head up from L  knee   11. Sitting, head  tipped to R knee   12. Head up from R  knee   13. Sitting head turns x5 3  14.Sitting head nods x5 2  15. In stance, 180  turn to L    16. In stance, 180  turn to R     Neuromuscular re-ed: Habituation  Seated x 5 horizontal head turns provokes 3-4/10 dizziness Seated x 5 vertical head turns provokes 2/10 dizziness Rolling x 3 reps -- symptoms improve with repetition Wilhelmena daroff x 2 reps  -- symptoms improve  Smooth pursuit Horizontal plane provokes Gaze adaptation Unable to tolerate gaze activities   PATIENT EDUCATION:  Education details:HEP review Person educated: Patient Education method: Explanation,  Education comprehension: verbalized understanding,  HOME EXERCISE PROGRAM: Access Code: [email or web address] URL: [email or web address] Date: 06/24/2024 Prepared by: Lucie Meeter  Exercises - Long Sitting Calf Stretch with Strap  - 1 x daily - 7 x weekly - 3 sets - 30s ec  hold - Seated Ankle Circles  - 1 x daily - 7 x weekly - 2 sets - 10 reps - Isometric Ankle Eversion at Wall  - 1 x daily - 7 x weekly - 2 sets - 10 reps - Seated Plantar Fascia Mobilization with Small  Ball  - 1 x daily - 7 x weekly - 3-5 minute  hold - Seated Great Toe Extension  - 1 x daily - 7 x weekly - 2 sets - 10 reps - Long Sitting Ankle Plantar Flexion with Resistance  - 1 x daily - 7 x weekly - 2 sets - 10 reps - Long Sitting Ankle Inversion with Resistance  - 1 x daily - 7 x weekly - 2 sets - 10 reps - Seated Calf Raise with Weights on Thighs  - 1 x daily - 7 x weekly - 2 sets - 10 reps - Side to Side Weight Shift with Counter Support  - 1 x daily - 7 x weekly - 2 sets - 10 reps - Staggered Stance Forward Backward Weight Shift with Counter Support  - 1 x daily - 7 x weekly - 2 sets  - 10 reps - Toe Raises with Counter Support  - 1 x daily - 7 x weekly - 2 sets - 10 reps - Towel Scrunches  - 1 x daily - 7 x weekly - 1 sets - 5 reps - Rolling rightleft sides for vestibular habituation  - 1-2 x daily - 7 x weekly - 1 sets - 3-5 reps - Seated Right Head Turns Vestibular Habituation  - 1-2 x daily - 7 x weekly - 1 sets - 5 reps - Standing Hip Abduction with Counter Support  - 1 x daily - 7 x weekly - 2 sets - 10 repss  ASSESSMENT:  CLINICAL IMPRESSION: The patient feels like she overdid yesterday and had increased light sensitivity today, more ankle soreness, and more dizziness. Her symptoms all improved during our session today. She also tolerated step ups leading with the L foot onto 4 surface without pain. PT to continue to progress to tolerance. She does have difficulty with convergence and I anticipate h/o migraines and age may impact her ability to tolerate. She would benefit from doing there ex working on this to habituate symptoms of eye strain. Plan to add-- would only do to distance that provokes mild symptoms.   EVAL: Patient is a 78 y.o. female who was seen today for physical therapy evaluation and treatment for s/p Lt ankle arthroscopy Brostrom stabilization, peroneus brevis and longus debridement and tenosynovectomy on 03/12/24. She demonstrates ROM, strength, gait and balance deficits that are consistent with her recent post-operative status. She will benefit from skilled PT to address the above stated deficits in order to return to optimal function.   OBJECTIVE IMPAIRMENTS: Abnormal gait, decreased activity tolerance, decreased balance, decreased endurance, decreased knowledge of condition, decreased mobility, difficulty walking, decreased ROM, decreased strength, increased edema, impaired flexibility, impaired sensation, improper body mechanics, postural dysfunction, and pain.   GOALS: Goals reviewed with patient? Yes  SHORT TERM GOALS: Target date:  06/09/2024  Patient will be independent and compliant with initial HEP.  Baseline: issued at eval  Goal status: MET  2.  Patient will demonstrate at least 60 degrees of Lt ankle plantarflexion AROM to improve push-off during gait cycle.  Baseline: see above Goal status: MET  3.  Patient will demonstrate at least 10 degrees of Lt ankle dorsiflexion AROM to improve gait mechanics.  Baseline: see above Goal status: MET  4.  Patient will complete TUG in </= 12 seconds to reduce fall risk.  Baseline: see above Goal status: MET  LONG TERM GOALS: Target date: 07/26/24  Patient will score >/= 60/104 on the Foot and Ankle Disability Index (MDC 7)  to signify clinically meaningful improvement in functional abilities.  Baseline: see above Goal status: MET  2.  Patient will maintain tandem stance for at least 10 seconds to improve gait stability.  Baseline: see above 06/26/24: tandem 1 second Goal status: progressing   3.  Patient will be modified independent with curb/stair negotiation.  Baseline: uses cane and requires support from spouse  06/26/24: requires cane  Goal status: MET  4.  Patient will self-report ability to tolerate at least 30 minutes of standing/walking activity.  Baseline: 10 minutes  06/26/24: 20 minutes  Goal status: progressing   5.  Patient will demonstrate at least 4/5 Lt ankle strength to improve stability when navigating on uneven terrain.  Baseline: see above  Goal status: partially met   6.  Patient will be independent with advanced home program to progress/maintain current level of function.  Baseline: initial HEP issued  Goal status: progressing    7. Patient will tolerate rolling R and L without d/o dizziness.   Baseline: 4/10 dizzines   Goal status: INITIAL   8. Patient will tolerate gaze x 1 viewing in sitting x 20 seconds to work towards improved tolerance to turns in standing.   Baseline: Unable to do 2 reps in sitting   Goal status:  INITIAL  PLAN:  PT FREQUENCY: 2x/week  PT DURATION: 12 weeks  PLANNED INTERVENTIONS: [id number]- PT Re-evaluation, 97750- Physical Performance Testing, 97110-Therapeutic exercises, 97530- Therapeutic activity, [id number]- Neuromuscular re-education, 97535- Self Care, [id number]- Manual therapy, [id number]- Gait training, [id number]- Aquatic Therapy, [id number]- Vasopneumatic device, Taping, Dry Needling, Cryotherapy, and Moist heat  PLAN FOR NEXT SESSION: review and progress HEP; ankle AROM and strengthening as tolerated; static balance. For vestibular: Progress habituation (seated head turns>standing head turns), add brandt daroff habituation, work on gaze as tolerated in clinic with cues,  Consider BBQ roll for canolith repositioning to R if symptoms remain after habituation (responded well to habituation in clinic and may not need). Convergence-- give for HEP (only provoking mild sxs)  Danel Requena, PT 07/07/24 11:04 AM

## 2024-07-08 DIAGNOSIS — M5416 Radiculopathy, lumbar region: Secondary | ICD-10-CM | POA: Diagnosis not present

## 2024-07-10 ENCOUNTER — Telehealth: Payer: Self-pay

## 2024-07-10 ENCOUNTER — Ambulatory Visit

## 2024-07-10 DIAGNOSIS — R2681 Unsteadiness on feet: Secondary | ICD-10-CM

## 2024-07-10 DIAGNOSIS — M6281 Muscle weakness (generalized): Secondary | ICD-10-CM

## 2024-07-10 DIAGNOSIS — R42 Dizziness and giddiness: Secondary | ICD-10-CM

## 2024-07-10 DIAGNOSIS — M25572 Pain in left ankle and joints of left foot: Secondary | ICD-10-CM

## 2024-07-10 DIAGNOSIS — R2689 Other abnormalities of gait and mobility: Secondary | ICD-10-CM

## 2024-07-10 NOTE — Therapy (Signed)
 OUTPATIENT PHYSICAL THERAPY LOWER EXTREMITY TREATMENT   Patient Name: ASALEE BARRETTE MRN: [id number] DOB:[birth year], 78 y.o., female Today's Date: 07/10/2024  END OF SESSION:  PT End of Session - 07/10/24 1147     Visit Number 14    Number of Visits 25    Date for PT Re-Evaluation 07/26/24    Authorization Type [employer]    Progress Note Due on Visit 20    PT Start Time 1147    PT Stop Time 1229    PT Time Calculation (min) 42 min    Equipment Utilized During Treatment Gait belt    Activity Tolerance Patient tolerated treatment well    Behavior During Therapy WFL for tasks assessed/performed           Past Medical History:  Diagnosis Date   Abdominal pain 10/26/2013   Allergic rhinitis 01/25/2016   ANA positive 07/29/2013   Anemia    Arthralgia 08/18/2013   Arthritis    Asthma, allergic 08/05/2012   Atrial flutter    past history- not current   Atypical chest pain 01/01/2015   Bilateral carpal tunnel syndrome 03/04/2015   CAD in native artery 08/25/2010   Minor CAD 2009, 2011, low risk Myoview  2013 and 2016        Cataract    bilaterally removed    Cellulitis of breast 02/05/2023   Chronic constipation    Chronic cough 01/09/2017   Chronic interstitial cystitis 02/01/2017   Chronic night sweats 01/08/2015   Common bile duct dilatation 02/13/2023   Concussion    x 3   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   DDD (degenerative disc disease), cervical 09/17/2018   Degenerative scoliosis 04/22/2018   Deviated septum 05/12/2016   Dysphagia    Dyspnea    Dysuria 03/09/2021   Elevated alkaline phosphatase level 01/25/2023   Essential hypertension 08/25/2010   External hemorrhoids 02/05/2015   Facial trauma 01/22/2023   Family history of adverse reaction to anesthesia    Family history of malignant hyperthermia    father had this   Fibrocystic breast disease 10/18/2010   Fibromyalgia    Frequency of urination    Generalized anxiety disorder 08/03/2010    GERD (gastroesophageal reflux disease)    Headache 10/13/2013   High serum vitamin D  05/18/2021   History of chronic bronchitis    History of colonic polyp 02/25/2019   History of hiatal hernia    History of tachycardia    CONTROLLED  WITH ATENOLOL    Hyperglycemia 03/08/2021   Hyperkalemia 09/28/2022   Hyperlipidemia    Intertrigo 12/13/2022   Kidney stone 02/26/2019   Knee pain, right 04/30/2015   Left hip pain 04/30/2015   Leg pain 02/24/2011   Qualifier: Diagnosis of   By: Antonio DOJamee         Leukocytosis 07/05/2022   Local reaction to hymenoptera sting 05/09/2023   Low back pain 09/08/2020   Major depressive disorder 02/05/2013   Malignant neoplasm of lower-outer quadrant of right breast of female, estrogen receptor positive 01/03/2013   Nasal turbinate hypertrophy 05/12/2016   Oral herpes 02/05/2023   OSA and COPD overlap syndrome 06/10/2020   Osteoporosis 01/2019   T score -2.2 stable/improved from prior study   Pelvic pain    Peripheral neuropathy 05/22/2014   Personal history of radiation therapy    Pneumonia    Primary insomnia 06/25/2017   Pulmonary nodules 02/12/2015   Rash and other nonspecific skin eruption 05/09/2023   Recurrent falls 05/02/2020  Recurrent sinusitis 02/25/2019   Right arm pain 07/20/2016   RLS (restless legs syndrome) 11/09/2019   RUQ pain 07/05/2022   S/P radiation therapy 11/12/12 - 12/05/12   right Breast   Sciatica 04/30/2015   Sepsis 2014   from UTI    Sjogren's disease 12/12/2021   Splenic lesion 09/02/2012   Sprain of lateral ligament of ankle joint 07/31/2023   Stage 3 chronic kidney disease    Statin intolerance 02/29/2020   Stricture and stenosis of esophagus 10/19/2011   Tongue lesion 02/05/2023   Tremor 07/05/2022   Urgency of urination    Vertigo 01/22/2023   Vitamin B12 deficiency 03/08/2021   Vocal cord cyst 05/18/2021   Past Surgical History:  Procedure Laterality Date   24 HOUR PH STUDY N/A 04/03/2016    Procedure: 24 HOUR PH STUDY;  Surgeon: Elspeth Deward Naval, MD;  Location: [facility];  Service: Gastroenterology;  Laterality: N/A;   ANKLE ARTHROSCOPY WITH RECONSTRUCTION Left 03/12/2024   Procedure: ANKLE ARTHROSCOPY WITH OPEN LATERAL ANKLE LIGAMENT STABILIZATION;  Surgeon: Barton Drape, MD;  Location: [facility];  Service: Orthopedics;  Laterality: Left;   BREAST BIOPSY Right 08/23/2012   ADH   BREAST BIOPSY Right 10/11/2012   Ductal Carcinoma   BREAST EXCISIONAL BIOPSY Right 09/04/2013   benign   BREAST LUMPECTOMY Right 10/11/2012   W/ SLN BX   CARDIAC CATHETERIZATION  09-13-2007  DR MORRIS   WELL-PRESERVED LVF/  DIFFUSE SCATTERED CORONARY CALCIFACATION AND ATHEROSCLEROSIS WITHOUT OBSTRUCTION   CARDIAC CATHETERIZATION  08-04-2010  DR MCALHANY   NON-OBSTRUCTIVE CAD/  pLAD 40%/  oLAD 30%/  mLAD 30%/  pRCA 30%/  EF 60%   CARDIOVASCULAR STRESS TEST  06-18-2012  DR McALHANY   LOW RISK NUCLEAR STUDY/  SMALL FIXED AREA OF MODERATELY DECREASED UPTAKE IN ANTEROSEPTAL WALL WHICH MAY BE ARTIFACTUAL/  NO ISCHEMIA/  EF 68%   COLONOSCOPY  09/29/2010   CYSTOSCOPY     CYSTOSCOPY WITH HYDRODISTENSION AND BIOPSY N/A 03/06/2014   Procedure: CYSTOSCOPY/HYDRODISTENSION/ INSTILATION OF MARCAINE  AND PYRIDIUM ;  Surgeon: Arlena LILLETTE Gal, MD;  Location: [facility] ;  Service: Urology;  Laterality: N/A;   CYSTOSCOPY/URETEROSCOPY/HOLMIUM LASER/STENT PLACEMENT Left 03/21/2023   Procedure: CYSTOSCOPY LEFT RETROGRADE PYELOGRAM, LEFT URETEROSCOPY, HOLMIUM LASER LITHOTRIPSYM, AND LEFT URETERAL STENT PLACEMENT;  Surgeon: Devere Lonni Righter, MD;  Location: [facility];  Service: Urology;  Laterality: Left;  60 MINUTES   ESOPHAGEAL MANOMETRY N/A 04/03/2016   Procedure: ESOPHAGEAL MANOMETRY (EM);  Surgeon: Elspeth Deward Naval, MD;  Location: [facility];  Service: Gastroenterology;  Laterality: N/A;   EXTRACORPOREAL SHOCK WAVE LITHOTRIPSY Left 02/06/2019   Procedure:  EXTRACORPOREAL SHOCK WAVE LITHOTRIPSY (ESWL);  Surgeon: Ottelin, Mark, MD;  Location: [facility];  Service: Urology;  Laterality: Left;   NASAL SINUS SURGERY  1985   ORIF RIGHT ANKLE  FX  2006   POLYPECTOMY     REMOVAL VOCAL CORD CYST  01/2013   REPAIR OF PERONEUS BREVIS TENDON Left 03/12/2024   Procedure: REPAIR OF PERONEUS TENDON;  Surgeon: Barton Drape, MD;  Location: [facility];  Service: Orthopedics;  Laterality: Left;   RIGHT BREAST BX  08/23/2012   RIGHT HAND SURGERY  X3  LAST ONE 2009   INCLUDES  ORIF RIGHT 5TH FINGER AND REVISION TWICE   SKIN CANCER EXCISION     face,arms   TONSILLECTOMY AND ADENOIDECTOMY  AGE 71   TOTAL ABDOMINAL HYSTERECTOMY W/ BILATERAL SALPINGOOPHORECTOMY  1982   W/  APPENDECTOMY   TRANSTHORACIC ECHOCARDIOGRAM  06/24/2012   GRADE I DIASTOLIC DYSFUNCTION/  EF 55-60%/  MILD MR   UPPER GASTROINTESTINAL ENDOSCOPY     Patient Active Problem List   Diagnosis Date Noted   Pedal edema 05/20/2024   Otalgia of both ears 04/23/2024   Tinnitus of both ears 04/23/2024   Hypokalemia 12/10/2023   IDA (iron deficiency anemia) 09/12/2023   Stage 3 chronic kidney disease    Right ankle pain 07/31/2023   Common bile duct dilatation 02/13/2023   Oral herpes 02/05/2023   Elevated alkaline phosphatase level 01/25/2023   Vertigo 01/22/2023   Intertrigo 12/13/2022   Hyperkalemia 09/28/2022   Anemia 09/28/2022   Tremor 07/05/2022   RUQ pain 07/05/2022   Leukocytosis 07/05/2022   Sjogren's disease 12/12/2021   Vocal cord cyst 05/18/2021   Vitamin D  deficiency 05/18/2021   Dysuria 03/09/2021   Vitamin B12 deficiency 03/08/2021   Hyperglycemia 03/08/2021   OSA and COPD overlap syndrome 06/10/2020   Recurrent falls 05/02/2020   Statin intolerance 02/29/2020   RLS (restless legs syndrome) 11/09/2019   Recurrent sinusitis 02/25/2019   History of colonic polyp 02/25/2019   DDD (degenerative disc disease), cervical 09/17/2018   Degenerative scoliosis  04/22/2018   Primary insomnia 06/25/2017   Chronic interstitial cystitis 02/01/2017   Deviated nasal septum 05/12/2016   Hypertrophy of nasal turbinates 05/12/2016   Dysphagia    Allergic rhinitis 01/25/2016   Dyspnea    Sciatica 04/30/2015   Bilateral carpal tunnel syndrome 03/04/2015   Hyperlipidemia 03/01/2015   Pulmonary nodules 02/12/2015   External hemorrhoids 02/05/2015   Chronic night sweats 01/08/2015   Peripheral neuropathy 05/22/2014   Headache 10/13/2013   Arthralgia 08/18/2013   ANA positive 07/29/2013   Depression with anxiety 02/05/2013   Malignant neoplasm of lower-outer quadrant of right breast of female, estrogen receptor positive 01/03/2013   Asthma, allergic 08/05/2012   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   GERD (gastroesophageal reflux disease) 06/03/2012   Stricture and stenosis of esophagus 10/19/2011   Osteoporosis 04/17/2011   Fibrocystic breast disease 10/18/2010   Essential hypertension 08/25/2010   CAD in native artery 08/25/2010   Generalized anxiety disorder 08/03/2010   Bronchitis 03/10/2010   Fibromyalgia 01/11/2010   PCP: Domenica Harlene LABOR, MD  REFERRING PROVIDER: Barton Drape, MD  REFERRING DIAG: [phone] (ICD-10-CM) - Peroneal tendinitis, left leg  THERAPY DIAG:  Muscle weakness (generalized)  Other abnormalities of gait and mobility  Unsteadiness on feet  Pain in left ankle and joints of left foot  Dizziness and giddiness  Rationale for Evaluation and Treatment: Rehabilitation  ONSET DATE: 03/12/24  SUBJECTIVE:  SUBJECTIVE STATEMENT: Patient underwent back injection on Tuesday, which has helped to reduce her back pain. The ankle feels the best it has today. She has been working on her dizzy exercises and feels like she is getting better. No headache or dizziness currently.   PERTINENT HISTORY: S/p Lt ankle arthroscopy Brostrom stabilization, peroneus brevis and longus debridement and tenosynovectomy 03/12/24 See extensive PMH  above   PAIN:  Are you having pain? Yes: NPRS scale: 2/10 Pain location: Lt midfoot  Pain description: feels like it is hard stretching. Aggravating factors: overactivity Relieving factors: rest  PRECAUTIONS: Fall,progress per protocol   WEIGHT BEARING RESTRICTIONS: Yes LLE WBAT In ASO brace   FALLS:  Has patient fallen in last 6 months? Yes. Number of falls 2 (fell stepping up grassy hill, fell in her kitchen from turning quickly)  LIVING ENVIRONMENT: Lives with: lives with their spouse Lives in: House/apartment Stairs:  Yes: Internal: 16 steps; on left going up Has following equipment at home: Single point cane  PATIENT GOALS: I want to be able to walk again.  NEXT MD VISIT: November 2025   OBJECTIVE:  Note: Objective measures were completed at Evaluation unless otherwise noted.  DIAGNOSTIC FINDINGS: none on file   PATIENT SURVEYS:  FADI: 46/104; 44% 06/26/24: FADI: 67/100; 67%  COGNITION: Overall cognitive status: Within functional limits for tasks assessed     SENSATION: See palpation  EDEMA:  Mild swelling about Lt ankle   PALPATION: Hypersensitivity to palpation about distal fibula, lateral malleolus   LOWER EXTREMITY ROM: Active ROM Right eval Left eval 05/05/24 Left  05/20/24 Left  06/03/24 Left   Hip flexion       Hip extension       Hip abduction       Hip adduction       Hip internal rotation       Hip external rotation       Knee flexion       Knee extension       Ankle dorsiflexion  6 9  10   Ankle plantarflexion  50  65 67  Ankle inversion  15  15 17   Ankle eversion  8  8 9    (Blank rows = not tested)  LOWER EXTREMITY MMT: MMT Right eval Left eval 06/26/24 Left   Hip flexion     Hip extension     Hip abduction     Hip adduction     Hip internal rotation     Hip external rotation     Knee flexion     Knee extension     Ankle dorsiflexion  3+ 4+  Ankle plantarflexion  3+ sitting 4- in sitting  Ankle inversion  3+ 4-  Ankle  eversion  3+ 4   (Blank rows = not tested)  FUNCTIONAL TESTS:  TUG: 13.5 seconds Romberg x 30 seconds Semi-tandem unable   06/09/24: TUG: 11.1 seconds   06/26/24: tandem 1 second   GAIT: Distance walked: 20 ft  Assistive device utilized: Single point cane. Ankle brace Level of assistance: Modified independence Comments: limited push-off LLE, foot ER,  [facility] Adult PT Treatment:                                                DATE: 07/10/24 Therapeutic Exercise: Calf raise on leg press 2 x 10 @ 40 lbs; DL  LAQ 2 x 10 @ 2 lbs  Seated HS curl 2 x 10 red band   Therapeutic Activity: Step up knee driver 6 inch step; single UE support x 10  Outdoor walking with SPC on inclined surfaces and curb negotiation. Working on Youth worker on uneven/inclined surfaces.    [facility] Adult PT Treatment:                                                DATE: 07/07/24 Dizziness=4-5/10 all around everything doesn't seem clear Therapeutic Exercise: Supine AROM neck rotation  Standing Rocking anteriorly with each LE supported on 8 surface Step ups into 4 leading with L LE x 10 reps Step ups laterally 4 step with L LE x 10  reps Mini heel raise-- within tolerable range of motion x 6 reps Manual Therapy: Gentle cervical traction and PROM due to dizziness and reports of neck tension and HA Ankle metatarsal grade II joint mobilization Subtalar mobilization grade I Neuromuscular re-ed: With neck AROM, patient gets a sensation of her eyes flashing PT recommended she keep eyes closed during activity today due to increased light sensitivity Single limb standing dec'ing UE support with CGA for safety Oculomotor Convergence to approximately 7inches with sensation of eye strain Habituation Gets dizziness to R only with neck AROM rotation that resolves within 3 repetitions    [facility] Adult PT Treatment:                                                DATE: 07/03/24 Therapeutic  Exercise: NuStep level 5 x 4 minutes UE/LE  Resisted plantarflexion 2 x10; red band  Resisted ankle dorsiflexion 2 x 10; red band  HEP review   Neuromuscular re-ed: Habituation Seated horizontal head turns to the Rt with increased velocity 2 x 5 eyes open Seated nose to knee x 5 each Gaze adaption VOR x 1 horizontal; 2 x 10, slow speed     [facility] Adult PT Treatment:                                                DATE: 06/30/24 Therapeutic Exercise: Seated Ball roll plantar surface Toe flexion/extension over ball Hamstring stretch x 30 seconds L and R - with foot supported on table Hamstring stretch x 30 seconds L and R with foot on floor Piriformis stretch x 30 seconds L and R Sit<>stand x 5 reps with wider base of support Sit<>stand with eyes closed with CGA for safety Manual Therapy: STM L soleous Gentle grade I subtallar mobilization and metatarsal mobilizations Scar tissue mobilization lateral ankle Neuromuscular re-ed: Habituation Seated horizontal head turns x 8 reps with eyes open Seated horizontal head turns x 8 reps with eyes closed Gaze adaptation X 1 viewing at slow pace x 5 reps Then worked on more ROM to R side Had patient begin with approx 20 degrees of head motion (small amplitude) and increase as she tolerated more Gait: Gait without L ankle ASO-- with patient noting significant decrease in pain after STM    [facility] Adult PT Treatment:                                                DATE: 06/26/24 Therapeutic Exercise: NuStep level 5 x 5 minutes UE/LE  Neuromuscular re-ed: Romberg 2 x 30 sec  Romberg eyes closed unable  normal stance eyes closed 2 x 30 sec Romberg vertical head turns immediate dizziness when returning to neutral from flexed position so discontinued  Normal stance on foam 2 x 30 sec  Romberg on foam x 30 sec  Therapeutic Activity: Re-assessment to determine overall progress, educating patient on progress towards goals.   PATIENT EDUCATION:   Education details:HEP review Person educated: Patient Education method: Explanation,  Education comprehension: verbalized understanding,  HOME EXERCISE PROGRAM: Access Code: [email or web address] URL: [email or web address] Date: 06/24/2024 Prepared  by: Lucie Meeter  Exercises - Long Sitting Calf Stretch with Strap  - 1 x daily - 7 x weekly - 3 sets - 30s ec  hold - Seated Ankle Circles  - 1 x daily - 7 x weekly - 2 sets - 10 reps - Isometric Ankle Eversion at Wall  - 1 x daily - 7 x weekly - 2 sets - 10 reps - Seated Plantar Fascia Mobilization with Small Ball  - 1 x daily - 7 x weekly - 3-5 minute  hold - Seated Great Toe Extension  - 1 x daily - 7 x weekly - 2 sets - 10 reps - Long Sitting Ankle Plantar Flexion with Resistance  - 1 x daily - 7 x weekly - 2 sets - 10 reps - Long Sitting Ankle Inversion with Resistance  - 1 x daily - 7 x weekly - 2 sets - 10 reps - Seated Calf Raise with Weights on Thighs  - 1 x daily - 7 x weekly - 2 sets - 10 reps - Side to Side Weight Shift with Counter Support  - 1 x daily - 7 x weekly - 2 sets - 10 reps - Staggered Stance Forward Backward Weight Shift with Counter Support  - 1 x daily - 7 x weekly - 2 sets - 10 reps - Toe Raises with Counter Support  - 1 x daily - 7 x weekly - 2 sets - 10 reps - Towel Scrunches  - 1 x daily - 7 x weekly - 1 sets - 5 reps - Rolling rightleft sides for vestibular habituation  - 1-2 x daily - 7 x weekly - 1 sets - 3-5 reps - Seated Right Head Turns Vestibular Habituation  - 1-2 x daily - 7 x weekly - 1 sets - 5 reps - Standing Hip Abduction with Counter Support  - 1 x daily - 7 x weekly - 2 sets - 10 repss  ASSESSMENT:  CLINICAL IMPRESSION: Patient arrives with minimal Lt foot pain and no reports of dizziness upon arrival. We focused on LE strengthening and completed outdoor walking activity to improve gait stability. With curb negotiation she is initially unsteady and attempts with the LLE as dominant leg. Was  cued to complete with RLE as dominant leg with patient noted to have improved stability and no hesitation with curb negotiation. With inclined walking she is stable during ascent, but with descent she widens her steps and reduces speed to improve stability. With cues this slightly improves. No increase in foot pain reported throughout session. One episode of short duration of dizziness with outdoor activity when she quickly moved from neck flexion to extension during curb negotiation.   EVAL: Patient is a 78 y.o. female who was seen today for physical therapy evaluation and treatment for s/p Lt ankle arthroscopy Brostrom stabilization, peroneus brevis and longus debridement and tenosynovectomy on 03/12/24. She demonstrates ROM, strength, gait and balance deficits that are consistent with her recent post-operative status. She will benefit from skilled PT to address the above stated deficits in order to return to optimal function.   OBJECTIVE IMPAIRMENTS: Abnormal gait, decreased activity tolerance, decreased balance, decreased endurance, decreased knowledge of condition, decreased mobility, difficulty walking, decreased ROM, decreased strength, increased edema, impaired flexibility, impaired sensation, improper body mechanics, postural dysfunction, and pain.   GOALS: Goals reviewed with patient? Yes  SHORT TERM GOALS: Target date: 06/09/2024  Patient will be independent and compliant with initial HEP.  Baseline: issued at eval  Goal  status: MET  2.  Patient will demonstrate at least 60 degrees of Lt ankle plantarflexion AROM to improve push-off during gait cycle.  Baseline: see above Goal status: MET  3.  Patient will demonstrate at least 10 degrees of Lt ankle dorsiflexion AROM to improve gait mechanics.  Baseline: see above Goal status: MET  4.  Patient will complete TUG in </= 12 seconds to reduce fall risk.  Baseline: see above Goal status: MET  LONG TERM GOALS: Target date:  07/26/24  Patient will score >/= 60/104 on the Foot and Ankle Disability Index (MDC 7) to signify clinically meaningful improvement in functional abilities.  Baseline: see above Goal status: MET  2.  Patient will maintain tandem stance for at least 10 seconds to improve gait stability.  Baseline: see above 06/26/24: tandem 1 second Goal status: progressing   3.  Patient will be modified independent with curb/stair negotiation.  Baseline: uses cane and requires support from spouse  06/26/24: requires cane  Goal status: MET  4.  Patient will self-report ability to tolerate at least 30 minutes of standing/walking activity.  Baseline: 10 minutes  06/26/24: 20 minutes  Goal status: progressing   5.  Patient will demonstrate at least 4/5 Lt ankle strength to improve stability when navigating on uneven terrain.  Baseline: see above  Goal status: partially met   6.  Patient will be independent with advanced home program to progress/maintain current level of function.  Baseline: initial HEP issued  Goal status: progressing    7. Patient will tolerate rolling R and L without d/o dizziness.   Baseline: 4/10 dizzines   Goal status: INITIAL   8. Patient will tolerate gaze x 1 viewing in sitting x 20 seconds to work towards improved tolerance to turns in standing.   Baseline: Unable to do 2 reps in sitting   Goal status: INITIAL  PLAN:  PT FREQUENCY: 2x/week  PT DURATION: 12 weeks  PLANNED INTERVENTIONS: 97164- PT Re-evaluation, 97750- Physical Performance Testing, 97110-Therapeutic exercises, 97530- Therapeutic activity, [id number]- Neuromuscular re-education, 97535- Self Care, [id number]- Manual therapy, [id number]- Gait training, [id number]- Aquatic Therapy, [id number]- Vasopneumatic device, Taping, Dry Needling, Cryotherapy, and Moist heat  PLAN FOR NEXT SESSION: review and progress HEP; ankle AROM and strengthening as tolerated; static balance. For vestibular: Progress habituation (seated head turns>standing head  turns), add brandt daroff habituation, work on gaze as tolerated in clinic with cues,  Consider BBQ roll for canolith repositioning to R if symptoms remain after habituation (responded well to habituation in clinic and may not need). Convergence-- give for HEP (only provoking mild sxs)  Holland Kotter, PT, DPT, ATC 07/10/24 12:31 PM

## 2024-07-10 NOTE — Telephone Encounter (Signed)
 Copied from CRM [id number]. Topic: Clinical - Medication Question >> Jul 10, 2024 10:06 AM Drema MATSU wrote: Reason for CRM: Patient is requesting a call from her case worker regarding Repatha  medication.. She states that she is about to run out.

## 2024-07-10 NOTE — Telephone Encounter (Signed)
 Called [facility] to remind them to use [employer] card - once they reprocessed, patient amount was $0.  LM on Patient's VM.

## 2024-07-14 ENCOUNTER — Telehealth: Payer: Self-pay

## 2024-07-14 ENCOUNTER — Ambulatory Visit

## 2024-07-14 NOTE — Telephone Encounter (Signed)
 Orders are in. MyChart message to patient to go to the lab

## 2024-07-14 NOTE — Therapy (Incomplete)
 OUTPATIENT PHYSICAL THERAPY LOWER EXTREMITY TREATMENT   Patient Name: [name] MRN: [id number] DOB:September 25, 1946, 78 y.o., female Today's Date: 07/14/2024  END OF SESSION:     Past Medical History:  Diagnosis Date   Abdominal pain 10/26/2013   Allergic rhinitis 01/25/2016   ANA positive 07/29/2013   Anemia    Arthralgia 08/18/2013   Arthritis    Asthma, allergic 08/05/2012   Atrial flutter    past history- not current   Atypical chest pain 01/01/2015   Bilateral carpal tunnel syndrome 03/04/2015   CAD in native artery 08/25/2010   Minor CAD 2009, 2011, low risk Myoview  2013 and 2016        Cataract    bilaterally removed    Cellulitis of breast 02/05/2023   Chronic constipation    Chronic cough 01/09/2017   Chronic interstitial cystitis 02/01/2017   Chronic night sweats 01/08/2015   Common bile duct dilatation 02/13/2023   Concussion    x 3   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   DDD (degenerative disc disease), cervical 09/17/2018   Degenerative scoliosis 04/22/2018   Deviated septum 05/12/2016   Dysphagia    Dyspnea    Dysuria 03/09/2021   Elevated alkaline phosphatase level 01/25/2023   Essential hypertension 08/25/2010   External hemorrhoids 02/05/2015   Facial trauma 01/22/2023   Family history of adverse reaction to anesthesia    Family history of malignant hyperthermia    father had this   Fibrocystic breast disease 10/18/2010   Fibromyalgia    Frequency of urination    Generalized anxiety disorder 08/03/2010   GERD (gastroesophageal reflux disease)    Headache 10/13/2013   High serum vitamin D  05/18/2021   History of chronic bronchitis    History of colonic polyp 02/25/2019   History of hiatal hernia    History of tachycardia    CONTROLLED  WITH ATENOLOL    Hyperglycemia 03/08/2021   Hyperkalemia 09/28/2022   Hyperlipidemia    Intertrigo 12/13/2022   Kidney stone 02/26/2019   Knee pain, right 04/30/2015   Left hip pain 04/30/2015   Leg pain  02/24/2011   Qualifier: Diagnosis of   By: Antonio DOJamee         Leukocytosis 07/05/2022   Local reaction to hymenoptera sting 05/09/2023   Low back pain 09/08/2020   Major depressive disorder 02/05/2013   Malignant neoplasm of lower-outer quadrant of right breast of female, estrogen receptor positive 01/03/2013   Nasal turbinate hypertrophy 05/12/2016   Oral herpes 02/05/2023   OSA and COPD overlap syndrome 06/10/2020   Osteoporosis 01/2019   T score -2.2 stable/improved from prior study   Pelvic pain    Peripheral neuropathy 05/22/2014   Personal history of radiation therapy    Pneumonia    Primary insomnia 06/25/2017   Pulmonary nodules 02/12/2015   Rash and other nonspecific skin eruption 05/09/2023   Recurrent falls 05/02/2020   Recurrent sinusitis 02/25/2019   Right arm pain 07/20/2016   RLS (restless legs syndrome) 11/09/2019   RUQ pain 07/05/2022   S/P radiation therapy 11/12/12 - 12/05/12   right Breast   Sciatica 04/30/2015   Sepsis 2014   from UTI    Sjogren's disease 12/12/2021   Splenic lesion 09/02/2012   Sprain of lateral ligament of ankle joint 07/31/2023   Stage 3 chronic kidney disease    Statin intolerance 02/29/2020   Stricture and stenosis of esophagus 10/19/2011   Tongue lesion 02/05/2023   Tremor 07/05/2022   Urgency of urination  Vertigo 01/22/2023   Vitamin B12 deficiency 03/08/2021   Vocal cord cyst 05/18/2021   Past Surgical History:  Procedure Laterality Date   24 HOUR PH STUDY N/A 04/03/2016   Procedure: 24 HOUR PH STUDY;  Surgeon: Elspeth Deward Naval, MD;  Location: [facility];  Service: Gastroenterology;  Laterality: N/A;   ANKLE ARTHROSCOPY WITH RECONSTRUCTION Left 03/12/2024   Procedure: ANKLE ARTHROSCOPY WITH OPEN LATERAL ANKLE LIGAMENT STABILIZATION;  Surgeon: Barton Drape, MD;  Location: [facility];  Service: Orthopedics;  Laterality: Left;   BREAST BIOPSY Right 08/23/2012   ADH   BREAST BIOPSY Right  10/11/2012   Ductal Carcinoma   BREAST EXCISIONAL BIOPSY Right 09/04/2013   benign   BREAST LUMPECTOMY Right 10/11/2012   W/ SLN BX   CARDIAC CATHETERIZATION  09-13-2007  DR MORRIS   WELL-PRESERVED LVF/  DIFFUSE SCATTERED CORONARY CALCIFACATION AND ATHEROSCLEROSIS WITHOUT OBSTRUCTION   CARDIAC CATHETERIZATION  08-04-2010  DR MCALHANY   NON-OBSTRUCTIVE CAD/  pLAD 40%/  oLAD 30%/  mLAD 30%/  pRCA 30%/  EF 60%   CARDIOVASCULAR STRESS TEST  06-18-2012  DR McALHANY   LOW RISK NUCLEAR STUDY/  SMALL FIXED AREA OF MODERATELY DECREASED UPTAKE IN ANTEROSEPTAL WALL WHICH MAY BE ARTIFACTUAL/  NO ISCHEMIA/  EF 68%   COLONOSCOPY  09/29/2010   CYSTOSCOPY     CYSTOSCOPY WITH HYDRODISTENSION AND BIOPSY N/A 03/06/2014   Procedure: CYSTOSCOPY/HYDRODISTENSION/ INSTILATION OF MARCAINE  AND PYRIDIUM ;  Surgeon: Arlena LILLETTE Gal, MD;  Location: [facility];  Service: Urology;  Laterality: N/A;   CYSTOSCOPY/URETEROSCOPY/HOLMIUM LASER/STENT PLACEMENT Left 03/21/2023   Procedure: CYSTOSCOPY LEFT RETROGRADE PYELOGRAM, LEFT URETEROSCOPY, HOLMIUM LASER LITHOTRIPSYM, AND LEFT URETERAL STENT PLACEMENT;  Surgeon: Devere Lonni Righter, MD;  Location: [facility];  Service: Urology;  Laterality: Left;  60 MINUTES   ESOPHAGEAL MANOMETRY N/A 04/03/2016   Procedure: ESOPHAGEAL MANOMETRY (EM);  Surgeon: Elspeth Deward Naval, MD;  Location: [facility];  Service: Gastroenterology;  Laterality: N/A;   EXTRACORPOREAL SHOCK WAVE LITHOTRIPSY Left 02/06/2019   Procedure: EXTRACORPOREAL SHOCK WAVE LITHOTRIPSY (ESWL);  Surgeon: Ottelin, Mark, MD;  Location: [facility];  Service: Urology;  Laterality: Left;   NASAL SINUS SURGERY  1985   ORIF RIGHT ANKLE  FX  2006   POLYPECTOMY     REMOVAL VOCAL CORD CYST  01/2013   REPAIR OF PERONEUS BREVIS TENDON Left 03/12/2024   Procedure: REPAIR OF PERONEUS TENDON;  Surgeon: Barton Drape, MD;  Location: [facility];  Service: Orthopedics;  Laterality: Left;    RIGHT BREAST BX  08/23/2012   RIGHT HAND SURGERY  X3  LAST ONE 2009   INCLUDES  ORIF RIGHT 5TH FINGER AND REVISION TWICE   SKIN CANCER EXCISION     face,arms   TONSILLECTOMY AND ADENOIDECTOMY  AGE 21   TOTAL ABDOMINAL HYSTERECTOMY W/ BILATERAL SALPINGOOPHORECTOMY  1982   W/  APPENDECTOMY   TRANSTHORACIC ECHOCARDIOGRAM  06/24/2012   GRADE I DIASTOLIC DYSFUNCTION/  EF 55-60%/  MILD MR   UPPER GASTROINTESTINAL ENDOSCOPY     Patient Active Problem List   Diagnosis Date Noted   Pedal edema 05/20/2024   Otalgia of both ears 04/23/2024   Tinnitus of both ears 04/23/2024   Hypokalemia 12/10/2023   IDA (iron deficiency anemia) 09/12/2023   Stage 3 chronic kidney disease    Right ankle pain 07/31/2023   Common bile duct dilatation 02/13/2023   Oral herpes 02/05/2023   Elevated alkaline phosphatase level 01/25/2023   Vertigo 01/22/2023   Intertrigo 12/13/2022  Hyperkalemia 09/28/2022   Anemia 09/28/2022   Tremor 07/05/2022   RUQ pain 07/05/2022   Leukocytosis 07/05/2022   Sjogren's disease 12/12/2021   Vocal cord cyst 05/18/2021   Vitamin D  deficiency 05/18/2021   Dysuria 03/09/2021   Vitamin B12 deficiency 03/08/2021   Hyperglycemia 03/08/2021   OSA and COPD overlap syndrome 06/10/2020   Recurrent falls 05/02/2020   Statin intolerance 02/29/2020   RLS (restless legs syndrome) 11/09/2019   Recurrent sinusitis 02/25/2019   History of colonic polyp 02/25/2019   DDD (degenerative disc disease), cervical 09/17/2018   Degenerative scoliosis 04/22/2018   Primary insomnia 06/25/2017   Chronic interstitial cystitis 02/01/2017   Deviated nasal septum 05/12/2016   Hypertrophy of nasal turbinates 05/12/2016   Dysphagia    Allergic rhinitis 01/25/2016   Dyspnea    Sciatica 04/30/2015   Bilateral carpal tunnel syndrome 03/04/2015   Hyperlipidemia 03/01/2015   Pulmonary nodules 02/12/2015   External hemorrhoids 02/05/2015   Chronic night sweats 01/08/2015   Peripheral neuropathy  05/22/2014   Headache 10/13/2013   Arthralgia 08/18/2013   ANA positive 07/29/2013   Depression with anxiety 02/05/2013   Malignant neoplasm of lower-outer quadrant of right breast of female, estrogen receptor positive 01/03/2013   Asthma, allergic 08/05/2012   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   GERD (gastroesophageal reflux disease) 06/03/2012   Stricture and stenosis of esophagus 10/19/2011   Osteoporosis 04/17/2011   Fibrocystic breast disease 10/18/2010   Essential hypertension 08/25/2010   CAD in native artery 08/25/2010   Generalized anxiety disorder 08/03/2010   Bronchitis 03/10/2010   Fibromyalgia 01/11/2010   PCP: Domenica Harlene LABOR, MD  REFERRING PROVIDER: Barton Drape, MD  REFERRING DIAG: [phone] (ICD-10-CM) - Peroneal tendinitis, left leg  THERAPY DIAG:  No diagnosis found.  Rationale for Evaluation and Treatment: Rehabilitation  ONSET DATE: 03/12/24  SUBJECTIVE:  SUBJECTIVE STATEMENT: Patient underwent back injection on Tuesday, which has helped to reduce her back pain. The ankle feels the best it has today. She has been working on her dizzy exercises and feels like she is getting better. No headache or dizziness currently.   PERTINENT HISTORY: S/p Lt ankle arthroscopy Brostrom stabilization, peroneus brevis and longus debridement and tenosynovectomy 03/12/24 See extensive PMH above   PAIN:  Are you having pain? Yes: NPRS scale: 2/10 Pain location: Lt midfoot  Pain description: feels like it is hard stretching. Aggravating factors: overactivity Relieving factors: rest  PRECAUTIONS: Fall,progress per protocol   WEIGHT BEARING RESTRICTIONS: Yes LLE WBAT In ASO brace   FALLS:  Has patient fallen in last 6 months? Yes. Number of falls 2 (fell stepping up grassy hill, fell in her kitchen from turning quickly)  LIVING ENVIRONMENT: Lives with: lives with their spouse Lives in: House/apartment Stairs: Yes: Internal: 16 steps; on left going up Has  following equipment at home: Single point cane  PATIENT GOALS: I want to be able to walk again.  NEXT MD VISIT: November 2025   OBJECTIVE:  Note: Objective measures were completed at Evaluation unless otherwise noted.  DIAGNOSTIC FINDINGS: none on file   PATIENT SURVEYS:  FADI: 46/104; 44% 06/26/24: FADI: 67/100; 67%  COGNITION: Overall cognitive status: Within functional limits for tasks assessed     SENSATION: See palpation  EDEMA:  Mild swelling about Lt ankle   PALPATION: Hypersensitivity to palpation about distal fibula, lateral malleolus   LOWER EXTREMITY ROM: Active ROM Right eval Left eval 05/05/24 Left  05/20/24 Left  06/03/24 Left   Hip flexion  Hip extension       Hip abduction       Hip adduction       Hip internal rotation       Hip external rotation       Knee flexion       Knee extension       Ankle dorsiflexion  6 9  10   Ankle plantarflexion  50  65 67  Ankle inversion  15  15 17   Ankle eversion  8  8 9    (Blank rows = not tested)  LOWER EXTREMITY MMT: MMT Right eval Left eval 06/26/24 Left   Hip flexion     Hip extension     Hip abduction     Hip adduction     Hip internal rotation     Hip external rotation     Knee flexion     Knee extension     Ankle dorsiflexion  3+ 4+  Ankle plantarflexion  3+ sitting 4- in sitting  Ankle inversion  3+ 4-  Ankle eversion  3+ 4   (Blank rows = not tested)  FUNCTIONAL TESTS:  TUG: 13.5 seconds Romberg x 30 seconds Semi-tandem unable   06/09/24: TUG: 11.1 seconds   06/26/24: tandem 1 second   GAIT: Distance walked: 20 ft  Assistive device utilized: Single point cane. Ankle brace Level of assistance: Modified independence Comments: limited push-off LLE, foot ER,  [facility] Adult PT Treatment:                                                DATE: 07/10/24 Therapeutic Exercise: Calf raise on leg press 2 x 10 @ 40 lbs; DL  LAQ 2 x 10 @ 2 lbs  Seated HS curl 2 x 10 red band   Therapeutic  Activity: Step up knee driver 6 inch step; single UE support x 10  Outdoor walking with SPC on inclined surfaces and curb negotiation. Working on Youth worker on uneven/inclined surfaces.    [facility] Adult PT Treatment:                                                DATE: 07/07/24 Dizziness=4-5/10 all around everything doesn't seem clear Therapeutic Exercise: Supine AROM neck rotation  Standing Rocking anteriorly with each LE supported on 8 surface Step ups into 4 leading with L LE x 10 reps Step ups laterally 4 step with L LE x 10 reps Mini heel raise-- within tolerable range of motion x 6 reps Manual Therapy: Gentle cervical traction and PROM due to dizziness and reports of neck tension and HA Ankle metatarsal grade II joint mobilization Subtalar mobilization grade I Neuromuscular re-ed: With neck AROM, patient gets a sensation of her eyes flashing PT recommended she keep eyes closed during activity today due to increased light sensitivity Single limb standing dec'ing UE support with CGA for safety Oculomotor Convergence to approximately 7inches with sensation of eye strain Habituation Gets dizziness to R only with neck AROM rotation that resolves within 3 repetitions    [facility] Adult PT Treatment:  DATE: 07/03/24 Therapeutic Exercise: NuStep level 5 x 4 minutes UE/LE  Resisted plantarflexion 2 x10; red band  Resisted ankle dorsiflexion 2 x 10; red band  HEP review   Neuromuscular re-ed: Habituation Seated horizontal head turns to the Rt with increased velocity 2 x 5 eyes open Seated nose to knee x 5 each Gaze adaption VOR x 1 horizontal; 2 x 10, slow speed     [facility] Adult PT Treatment:                                                DATE: 06/30/24 Therapeutic Exercise: Seated Ball roll plantar surface Toe flexion/extension over ball Hamstring stretch x 30 seconds L and R - with foot supported on  table Hamstring stretch x 30 seconds L and R with foot on floor Piriformis stretch x 30 seconds L and R Sit<>stand x 5 reps with wider base of support Sit<>stand with eyes closed with CGA for safety Manual Therapy: STM L soleous Gentle grade I subtallar mobilization and metatarsal mobilizations Scar tissue mobilization lateral ankle Neuromuscular re-ed: Habituation Seated horizontal head turns x 8 reps with eyes open Seated horizontal head turns x 8 reps with eyes closed Gaze adaptation X 1 viewing at slow pace x 5 reps Then worked on more ROM to R side Had patient begin with approx 20 degrees of head motion (small amplitude) and increase as she tolerated more Gait: Gait without L ankle ASO-- with patient noting significant decrease in pain after STM    [facility] Adult PT Treatment:                                                DATE: 06/26/24 Therapeutic Exercise: NuStep level 5 x 5 minutes UE/LE  Neuromuscular re-ed: Romberg 2 x 30 sec  Romberg eyes closed unable  normal stance eyes closed 2 x 30 sec Romberg vertical head turns immediate dizziness when returning to neutral from flexed position so discontinued  Normal stance on foam 2 x 30 sec  Romberg on foam x 30 sec  Therapeutic Activity: Re-assessment to determine overall progress, educating patient on progress towards goals.   PATIENT EDUCATION:  Education details:HEP review Person educated: Patient Education method: Explanation,  Education comprehension: verbalized understanding,  HOME EXERCISE PROGRAM: Access Code: [email or web address] URL: [email or web address] Date: 06/24/2024 Prepared by: Lucie Meeter  Exercises - Long Sitting Calf Stretch with Strap  - 1 x daily - 7 x weekly - 3 sets - 30s ec  hold - Seated Ankle Circles  - 1 x daily - 7 x weekly - 2 sets - 10 reps - Isometric Ankle Eversion at Wall  - 1 x daily - 7 x weekly - 2 sets - 10 reps - Seated Plantar Fascia Mobilization with Small Ball  - 1 x daily  - 7 x weekly - 3-5 minute  hold - Seated Great Toe Extension  - 1 x daily - 7 x weekly - 2 sets - 10 reps - Long Sitting Ankle Plantar Flexion with Resistance  - 1 x daily - 7 x weekly - 2 sets - 10 reps - Long Sitting Ankle Inversion with Resistance  - 1 x daily - 7 x weekly - 2 sets - 10  reps - Seated Calf Raise with Weights on Thighs  - 1 x daily - 7 x weekly - 2 sets - 10 reps - Side to Side Weight Shift with Counter Support  - 1 x daily - 7 x weekly - 2 sets - 10 reps - Staggered Stance Forward Backward Weight Shift with Counter Support  - 1 x daily - 7 x weekly - 2 sets - 10 reps - Toe Raises with Counter Support  - 1 x daily - 7 x weekly - 2 sets - 10 reps - Towel Scrunches  - 1 x daily - 7 x weekly - 1 sets - 5 reps - Rolling rightleft sides for vestibular habituation  - 1-2 x daily - 7 x weekly - 1 sets - 3-5 reps - Seated Right Head Turns Vestibular Habituation  - 1-2 x daily - 7 x weekly - 1 sets - 5 reps - Standing Hip Abduction with Counter Support  - 1 x daily - 7 x weekly - 2 sets - 10 repss  ASSESSMENT:  CLINICAL IMPRESSION: Patient arrives with minimal Lt foot pain and no reports of dizziness upon arrival. We focused on LE strengthening and completed outdoor walking activity to improve gait stability. With curb negotiation she is initially unsteady and attempts with the LLE as dominant leg. Was cued to complete with RLE as dominant leg with patient noted to have improved stability and no hesitation with curb negotiation. With inclined walking she is stable during ascent, but with descent she widens her steps and reduces speed to improve stability. With cues this slightly improves. No increase in foot pain reported throughout session. One episode of short duration of dizziness with outdoor activity when she quickly moved from neck flexion to extension during curb negotiation.   EVAL: Patient is a 78 y.o. female who was seen today for physical therapy evaluation and treatment for s/p  Lt ankle arthroscopy Brostrom stabilization, peroneus brevis and longus debridement and tenosynovectomy on 03/12/24. She demonstrates ROM, strength, gait and balance deficits that are consistent with her recent post-operative status. She will benefit from skilled PT to address the above stated deficits in order to return to optimal function.   OBJECTIVE IMPAIRMENTS: Abnormal gait, decreased activity tolerance, decreased balance, decreased endurance, decreased knowledge of condition, decreased mobility, difficulty walking, decreased ROM, decreased strength, increased edema, impaired flexibility, impaired sensation, improper body mechanics, postural dysfunction, and pain.   GOALS: Goals reviewed with patient? Yes  SHORT TERM GOALS: Target date: 06/09/2024  Patient will be independent and compliant with initial HEP.  Baseline: issued at eval  Goal status: MET  2.  Patient will demonstrate at least 60 degrees of Lt ankle plantarflexion AROM to improve push-off during gait cycle.  Baseline: see above Goal status: MET  3.  Patient will demonstrate at least 10 degrees of Lt ankle dorsiflexion AROM to improve gait mechanics.  Baseline: see above Goal status: MET  4.  Patient will complete TUG in </= 12 seconds to reduce fall risk.  Baseline: see above Goal status: MET  LONG TERM GOALS: Target date: 07/26/24  Patient will score >/= 60/104 on the Foot and Ankle Disability Index (MDC 7) to signify clinically meaningful improvement in functional abilities.  Baseline: see above Goal status: MET  2.  Patient will maintain tandem stance for at least 10 seconds to improve gait stability.  Baseline: see above 06/26/24: tandem 1 second Goal status: progressing   3.  Patient will be modified independent with curb/stair negotiation.  Baseline: uses cane and requires support from spouse  06/26/24: requires cane  Goal status: MET  4.  Patient will self-report ability to tolerate at least 30 minutes of  standing/walking activity.  Baseline: 10 minutes  06/26/24: 20 minutes  Goal status: progressing   5.  Patient will demonstrate at least 4/5 Lt ankle strength to improve stability when navigating on uneven terrain.  Baseline: see above  Goal status: partially met   6.  Patient will be independent with advanced home program to progress/maintain current level of function.  Baseline: initial HEP issued  Goal status: progressing    7. Patient will tolerate rolling R and L without d/o dizziness.   Baseline: 4/10 dizzines   Goal status: INITIAL   8. Patient will tolerate gaze x 1 viewing in sitting x 20 seconds to work towards improved tolerance to turns in standing.   Baseline: Unable to do 2 reps in sitting   Goal status: INITIAL  PLAN:  PT FREQUENCY: 2x/week  PT DURATION: 12 weeks  PLANNED INTERVENTIONS: 97164- PT Re-evaluation, 97750- Physical Performance Testing, 97110-Therapeutic exercises, 97530- Therapeutic activity, [id number]- Neuromuscular re-education, 97535- Self Care, [id number]- Manual therapy, [id number]- Gait training, [id number]- Aquatic Therapy, [id number]- Vasopneumatic device, Taping, Dry Needling, Cryotherapy, and Moist heat  PLAN FOR NEXT SESSION: review and progress HEP; ankle AROM and strengthening as tolerated; static balance. For vestibular: Progress habituation (seated head turns>standing head turns), add brandt daroff habituation, work on gaze as tolerated in clinic with cues,  Consider BBQ roll for canolith repositioning to R if symptoms remain after habituation (responded well to habituation in clinic and may not need). Convergence-- give for HEP (only provoking mild sxs)  Charley Lafrance, PT, DPT, ATC 07/14/24 7:08 AM

## 2024-07-14 NOTE — Telephone Encounter (Signed)
-----   Message from [facility] [place] [facility] sent at 06/19/2024  9:11 AM EDT ----- Regarding: labs CBC and TIBC/ferritin panel due

## 2024-07-15 ENCOUNTER — Ambulatory Visit (INDEPENDENT_AMBULATORY_CARE_PROVIDER_SITE_OTHER): Admitting: Professional

## 2024-07-15 ENCOUNTER — Encounter: Payer: Self-pay | Admitting: Professional

## 2024-07-15 DIAGNOSIS — F331 Major depressive disorder, recurrent, moderate: Secondary | ICD-10-CM | POA: Diagnosis not present

## 2024-07-15 DIAGNOSIS — F411 Generalized anxiety disorder: Secondary | ICD-10-CM | POA: Diagnosis not present

## 2024-07-15 DIAGNOSIS — F4310 Post-traumatic stress disorder, unspecified: Secondary | ICD-10-CM
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
## 2024-07-17 ENCOUNTER — Ambulatory Visit (HOSPITAL_COMMUNITY)
Admission: RE | Admit: 2024-07-17 | Discharge: 2024-07-17 | Disposition: A | Discharge status: Home / Self Care | Source: Ambulatory Visit | Attending: Vascular Surgery | Admitting: Vascular Surgery

## 2024-07-17 ENCOUNTER — Telehealth: Payer: Self-pay

## 2024-07-17 ENCOUNTER — Encounter

## 2024-07-17 ENCOUNTER — Ambulatory Visit: Payer: Self-pay | Admitting: Family Medicine

## 2024-07-17 DIAGNOSIS — R0600 Dyspnea, unspecified: Secondary | ICD-10-CM | POA: Insufficient documentation

## 2024-07-17 LAB — ECHOCARDIOGRAM COMPLETE
AR max vel: 2.39 cm2
AV Area VTI: 2.33 cm2
AV Area mean vel: 2.35 cm2
AV Mean grad: 2 mmHg
AV Peak grad: 3.3 mmHg
AV Vena cont: 0.23 cm
Ao pk vel: 0.91 m/s
Area-P 1/2: 2.69 cm2
MV M vel: 3.81 m/s
MV Peak grad: 58.1 mmHg
P 1/2 time: 713 ms
S' Lateral: 2.5 cm

## 2024-07-17 NOTE — Telephone Encounter (Signed)
 Called patient and she was advised that lab orders are placed for [facility] lab. She was also advised that the same orders that Dr. Leigh placed is same as the orders Dr. Domenica placed.  Copied from CRM [id number]. Topic: Clinical - Request for Lab/Test Order >> Jul 17, 2024  8:09 AM Willma R wrote: Reason for CRM: Patient calling in regards to her labs, would like to have both orders done at the same time. States Dr Carleton advised to go to [facility]. Patient would like a callback to discuss as unable to find any information on [facility] to provide to patient.  Patient can be reached at [phone]

## 2024-07-18 ENCOUNTER — Telehealth: Payer: Self-pay | Admitting: Cardiology

## 2024-07-18 ENCOUNTER — Encounter: Payer: Self-pay | Admitting: Cardiology

## 2024-07-18 NOTE — Telephone Encounter (Signed)
 Pt c/o swelling/edema: STAT if pt has developed SOB within 24 hours  If swelling, where is the swelling located?   Feet/legs  How much weight have you gained and in what time span? Yes  Have you gained 2 pounds in a day or 5 pounds in a week?   Yes but goes down after taking her Lasix   Do you have a log of your daily weights (if so, list)?   No  Are you currently taking a fluid pill?  Yes  Are you currently SOB?  Patient states yes when moving around  Have you traveled recently in a car or plane for an extended period of time?   No  Patient is concerned that around lunch time each day her feet/legs starts swelling.  Patient stated she hurts in her abdomen when she bends over.

## 2024-07-18 NOTE — Telephone Encounter (Signed)
 Left message for patient to return the call.

## 2024-07-18 NOTE — Telephone Encounter (Signed)
 Copied from CRM [id number]. Topic: Clinical - Medical Advice >> Jul 18, 2024 12:33 PM [place] J wrote: Reason for CRM: Patient wanted to let Dr. Domenica know that she set up an appointment with her cardiologist and was wondering if that's what she wanted her to do. Patient was a bit confused on what Dr. Domenica needed her to do after she reviewed her echocardiogram results.

## 2024-07-18 NOTE — Telephone Encounter (Signed)
 Error

## 2024-07-18 NOTE — Progress Notes (Signed)
 Called patient and no answer left message to return call.

## 2024-07-21 ENCOUNTER — Encounter: Admitting: Rehabilitative and Restorative Service Providers"

## 2024-07-21 NOTE — Progress Notes (Unsigned)
 HPI: FU CAD. Cardiac catheterization in August 2011 showed a 40% proximal LAD, 30% mid LAD, 30% RCA and her ejection fraction was 60%. Nuclear study March 2016 showed ejection fraction 67%. Breast attenuation but no ischemia. MRA of the head October 2018 normal. Renal dopplers 1/19 showed no RAS.  Colonoscopy June 2025 showed 3 polyps which were removed and hemorrhoids.  Had esophageal dilatation in June 2025 and otherwise a few gastric polyps noted.  Echocardiogram July 2025 showed normal LV function, grade 1 diastolic dysfunction, mild mitral regurgitation, mild aortic insufficiency.  Laboratories April 2025 showed normal TSH, LDL 83, creatinine 0.93 and hemoglobin 13.3.  Since last seen   Current Outpatient Medications  Medication Sig Dispense Refill   acetaminophen  (TYLENOL ) 500 MG tablet Take 1,000 mg by mouth every 6 (six) hours as needed for mild pain or headache.      albuterol  (VENTOLIN  HFA) 108 (90 Base) MCG/ACT inhaler Inhale 2 puffs into the lungs every 4 (four) hours as needed for wheezing or shortness of breath (coughing fits). 18 g 1   amLODipine  (NORVASC ) 5 MG tablet Take 1 tablet by mouth once daily 90 tablet 3   aspirin  EC 81 MG tablet Take 1 tablet (81 mg total) by mouth daily. 90 tablet 3   atenolol  (TENORMIN ) 25 MG tablet Take 1 tablet (25 mg total) by mouth 2 (two) times daily. 180 tablet 1   carboxymethylcellul-glycerin (OPTIVE) 0.5-0.9 % ophthalmic solution Place 1 drop into both eyes daily as needed for dry eyes.     Cholecalciferol  (VITAMIN D3) 50 MCG (2000 UT) CAPS Take 2,000 Units by mouth daily.     cyanocobalamin  (VITAMIN B12) 1000 MCG tablet Take 1,000 mg by mouth daily with breakfast.     diclofenac Sodium (VOLTAREN) 1 % GEL Apply 2 g topically daily as needed (Back pain).     DULoxetine  (CYMBALTA ) 60 MG capsule Take 1 capsule (60 mg total) by mouth 2 (two) times daily. 180 capsule 1   EPINEPHrine  0.3 mg/0.3 mL IJ SOAJ injection Inject 0.3 mg into the muscle as  needed for anaphylaxis. 2 each 1   fluticasone  (FLONASE ) 50 MCG/ACT nasal spray Place 2 sprays into both nostrils daily. 16 g 5   fluticasone  (FLOVENT  HFA) 220 MCG/ACT inhaler Inhale 1-2 puffs into the lungs in the morning and at bedtime. 1 each 12   folic acid  (FOLVITE ) 1 MG tablet Take 1 tablet (1 mg total) by mouth daily.     furosemide  (LASIX ) 20 MG tablet TAKE 1 TABLET BY MOUTH AS NEEDED FOR EDEMA 90 tablet 3   gabapentin  (NEURONTIN ) 300 MG capsule Take 600 mg in the morning 600 mg at lunch time and 900 mg at bedtime 210 capsule 1   hydrOXYzine  (ATARAX ) 10 MG tablet Take 0.5-2 tablets (5-20 mg total) by mouth 3 (three) times daily as needed for anxiety (not with Loratadine ). 90 tablet 2   linaclotide  (LINZESS ) 145 MCG CAPS capsule Take 1 capsule (145 mcg total) by mouth daily before breakfast. 30 capsule 5   loratadine  (CLARITIN ) 10 MG tablet Take 10 mg by mouth daily as needed for rhinitis.     nitroGLYCERIN  (NITROSTAT ) 0.4 MG SL tablet Place 1 tablet (0.4 mg total) under the tongue every 5 (five) minutes x 3 doses as needed for chest pain. 25 tablet 2   nystatin  cream (MYCOSTATIN ) Apply 1 application topically 2 (two) times daily. (Patient taking differently: Apply 1 application  topically daily as needed (vaginal yeast).) 30 g 2  polyethylene glycol (MIRALAX ) 17 g packet Take 17 g by mouth 2 (two) times daily. 14 each 0   REPATHA  SURECLICK 140 MG/ML SOAJ INJECT 1 SYRINGE SUBCUTANEOUSLY EVERY 14 DAYS 6 mL 0   sucralfate  (CARAFATE ) 1 GM/10ML suspension Take 10 mLs (1 g total) by mouth 4 (four) times daily -  with meals and at bedtime. 420 mL 1   traMADol  (ULTRAM ) 50 MG tablet Take 1 tablet by mouth 3 (three) times daily as needed.     traZODone  (DESYREL ) 100 MG tablet Take 1 tablet (100 mg total) by mouth at bedtime. 90 tablet 0   Vitamin D , Ergocalciferol , (DRISDOL ) 1.25 MG (50000 UNIT) CAPS capsule Take 1 capsule (50,000 Units total) by mouth every 7 (seven) days. 12x Weeks 5 capsule 3    Vonoprazan Fumarate  (VOQUEZNA ) 10 MG TABS Take 1 capsule by mouth daily. 30 tablet 1   No current facility-administered medications for this visit.     Past Medical History:  Diagnosis Date   Abdominal pain 10/26/2013   Allergic rhinitis 01/25/2016   ANA positive 07/29/2013   Anemia    Arthralgia 08/18/2013   Arthritis    Asthma, allergic 08/05/2012   Atrial flutter    past history- not current   Atypical chest pain 01/01/2015   Bilateral carpal tunnel syndrome 03/04/2015   CAD in native artery 08/25/2010   Minor CAD 2009, 2011, low risk Myoview  2013 and 2016        Cataract    bilaterally removed    Cellulitis of breast 02/05/2023   Chronic constipation    Chronic cough 01/09/2017   Chronic interstitial cystitis 02/01/2017   Chronic night sweats 01/08/2015   Common bile duct dilatation 02/13/2023   Concussion    x 3   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   DDD (degenerative disc disease), cervical 09/17/2018   Degenerative scoliosis 04/22/2018   Deviated septum 05/12/2016   Dysphagia    Dyspnea    Dysuria 03/09/2021   Elevated alkaline phosphatase level 01/25/2023   Essential hypertension 08/25/2010   External hemorrhoids 02/05/2015   Facial trauma 01/22/2023   Family history of adverse reaction to anesthesia    Family history of malignant hyperthermia    father had this   Fibrocystic breast disease 10/18/2010   Fibromyalgia    Frequency of urination    Generalized anxiety disorder 08/03/2010   GERD (gastroesophageal reflux disease)    Headache 10/13/2013   High serum vitamin D  05/18/2021   History of chronic bronchitis    History of colonic polyp 02/25/2019   History of hiatal hernia    History of tachycardia    CONTROLLED  WITH ATENOLOL    Hyperglycemia 03/08/2021   Hyperkalemia 09/28/2022   Hyperlipidemia    Intertrigo 12/13/2022   Kidney stone 02/26/2019   Knee pain, right 04/30/2015   Left hip pain 04/30/2015   Leg pain 02/24/2011   Qualifier:  Diagnosis of   By: Antonio DOJamee         Leukocytosis 07/05/2022   Local reaction to hymenoptera sting 05/09/2023   Low back pain 09/08/2020   Major depressive disorder 02/05/2013   Malignant neoplasm of lower-outer quadrant of right breast of female, estrogen receptor positive 01/03/2013   Nasal turbinate hypertrophy 05/12/2016   Oral herpes 02/05/2023   OSA and COPD overlap syndrome 06/10/2020   Osteoporosis 01/2019   T score -2.2 stable/improved from prior study   Pelvic pain    Peripheral neuropathy 05/22/2014   Personal history of  radiation therapy    Pneumonia    Primary insomnia 06/25/2017   Pulmonary nodules 02/12/2015   Rash and other nonspecific skin eruption 05/09/2023   Recurrent falls 05/02/2020   Recurrent sinusitis 02/25/2019   Right arm pain 07/20/2016   RLS (restless legs syndrome) 11/09/2019   RUQ pain 07/05/2022   S/P radiation therapy 11/12/12 - 12/05/12   right Breast   Sciatica 04/30/2015   Sepsis 2014   from UTI    Sjogren's disease 12/12/2021   Splenic lesion 09/02/2012   Sprain of lateral ligament of ankle joint 07/31/2023   Stage 3 chronic kidney disease    Statin intolerance 02/29/2020   Stricture and stenosis of esophagus 10/19/2011   Tongue lesion 02/05/2023   Tremor 07/05/2022   Urgency of urination    Vertigo 01/22/2023   Vitamin B12 deficiency 03/08/2021   Vocal cord cyst 05/18/2021    Past Surgical History:  Procedure Laterality Date   24 HOUR PH STUDY N/A 04/03/2016   Procedure: 24 HOUR PH STUDY;  Surgeon: Elspeth Deward Naval, MD;  Location: [facility];  Service: Gastroenterology;  Laterality: N/A;   ANKLE ARTHROSCOPY WITH RECONSTRUCTION Left 03/12/2024   Procedure: ANKLE ARTHROSCOPY WITH OPEN LATERAL ANKLE LIGAMENT STABILIZATION;  Surgeon: Barton Drape, MD;  Location: [facility];  Service: Orthopedics;  Laterality: Left;   BREAST BIOPSY Right 08/23/2012   ADH   BREAST BIOPSY Right 10/11/2012   Ductal  Carcinoma   BREAST EXCISIONAL BIOPSY Right 09/04/2013   benign   BREAST LUMPECTOMY Right 10/11/2012   W/ SLN BX   CARDIAC CATHETERIZATION  09-13-2007  DR MORRIS   WELL-PRESERVED LVF/  DIFFUSE SCATTERED CORONARY CALCIFACATION AND ATHEROSCLEROSIS WITHOUT OBSTRUCTION   CARDIAC CATHETERIZATION  08-04-2010  DR MCALHANY   NON-OBSTRUCTIVE CAD/  pLAD 40%/  oLAD 30%/  mLAD 30%/  pRCA 30%/  EF 60%   CARDIOVASCULAR STRESS TEST  06-18-2012  DR McALHANY   LOW RISK NUCLEAR STUDY/  SMALL FIXED AREA OF MODERATELY DECREASED UPTAKE IN ANTEROSEPTAL WALL WHICH MAY BE ARTIFACTUAL/  NO ISCHEMIA/  EF 68%   COLONOSCOPY  09/29/2010   CYSTOSCOPY     CYSTOSCOPY WITH HYDRODISTENSION AND BIOPSY N/A 03/06/2014   Procedure: CYSTOSCOPY/HYDRODISTENSION/ INSTILATION OF MARCAINE  AND PYRIDIUM ;  Surgeon: Arlena LILLETTE Gal, MD;  Location: [facility];  Service: Urology;  Laterality: N/A;   CYSTOSCOPY/URETEROSCOPY/HOLMIUM LASER/STENT PLACEMENT Left 03/21/2023   Procedure: CYSTOSCOPY LEFT RETROGRADE PYELOGRAM, LEFT URETEROSCOPY, HOLMIUM LASER LITHOTRIPSYM, AND LEFT URETERAL STENT PLACEMENT;  Surgeon: Devere Lonni Righter, MD;  Location: [facility];  Service: Urology;  Laterality: Left;  60 MINUTES   ESOPHAGEAL MANOMETRY N/A 04/03/2016   Procedure: ESOPHAGEAL MANOMETRY (EM);  Surgeon: Elspeth Deward Naval, MD;  Location: [facility];  Service: Gastroenterology;  Laterality: N/A;   EXTRACORPOREAL SHOCK WAVE LITHOTRIPSY Left 02/06/2019   Procedure: EXTRACORPOREAL SHOCK WAVE LITHOTRIPSY (ESWL);  Surgeon: Ottelin, Mark, MD;  Location: [facility];  Service: Urology;  Laterality: Left;   NASAL SINUS SURGERY  1985   ORIF RIGHT ANKLE  FX  2006   POLYPECTOMY     REMOVAL VOCAL CORD CYST  01/2013   REPAIR OF PERONEUS BREVIS TENDON Left 03/12/2024   Procedure: REPAIR OF PERONEUS TENDON;  Surgeon: Barton Drape, MD;  Location:  SURGERY CENTER;  Service: Orthopedics;  Laterality: Left;   RIGHT BREAST BX   08/23/2012   RIGHT HAND SURGERY  X3  LAST ONE 2009   INCLUDES  ORIF RIGHT 5TH FINGER AND REVISION TWICE   SKIN CANCER  EXCISION     face,arms   TONSILLECTOMY AND ADENOIDECTOMY  AGE 89   TOTAL ABDOMINAL HYSTERECTOMY W/ BILATERAL SALPINGOOPHORECTOMY  1982   W/  APPENDECTOMY   TRANSTHORACIC ECHOCARDIOGRAM  06/24/2012   GRADE I DIASTOLIC DYSFUNCTION/  EF 55-60%/  MILD MR   UPPER GASTROINTESTINAL ENDOSCOPY      Social History   Socioeconomic History   Marital status: Married    Spouse name: Tanda    Number of children: 3   Years of education: 16   Highest education level: Bachelor's degree (e.g., BA, AB, BS)  Occupational History   Occupation: Retired    Comment: Charity fundraiser  Tobacco Use   Smoking status: Former    Current packs/day: 0.00    Average packs/day: 1 pack/day for 15.0 years (15.0 ttl pk-yrs)    Types: Cigarettes    Start date: 05/27/1990    Quit date: 05/27/2005    Years since quitting: 19.1    Passive exposure: Never   Smokeless tobacco: Never  Vaping Use   Vaping status: Never Used  Substance and Sexual Activity   Alcohol use: No    Alcohol/week: 0.0 standard drinks of alcohol   Drug use: No   Sexual activity: Not Currently    Partners: Male    Birth control/protection: Surgical    Comment: 1st intercourse 78 yo-Fewer than 5 partners, hysterectomy  Other Topics Concern   Not on file  Social History Narrative   Lives with husband.   Caffeine use: 1/2 cup per day   Exercise-- 2days a week [employer],  Water  aerobics, walking       Retired Engineer, civil (consulting)   No dietary restrictions, tries to maintain a heart healthy diet   Very pleasant lady   Social Drivers of Corporate investment banker Strain: Low Risk  (06/21/2024)   Overall Financial Resource Strain (CARDIA)    Difficulty of Paying Living Expenses: Not very hard  Food Insecurity: No Food Insecurity (06/21/2024)   Hunger Vital Sign    Worried About Running Out of Food in the Last Year: Never true    Ran Out of Food in the Last  Year: Never true  Transportation Needs: No Transportation Needs (06/21/2024)   PRAPARE - Administrator, Civil Service (Medical): No    Lack of Transportation (Non-Medical): No  Physical Activity: Insufficiently Active (06/21/2024)   Exercise Vital Sign    Days of Exercise per Week: 2 days    Minutes of Exercise per Session: 20 min  Stress: No Stress Concern Present (06/21/2024)   [employer] of Occupational Health - Occupational Stress Questionnaire    Feeling of Stress: Only a little  Social Connections: Socially Integrated (06/21/2024)   Social Connection and Isolation Panel    Frequency of Communication with Friends and Family: More than three times a week    Frequency of Social Gatherings with Friends and Family: Twice a week    Attends Religious Services: More than 4 times per year    Active Member of [employer] or Organizations: Yes    Attends Engineer, structural: More than 4 times per year    Marital Status: Married  Catering manager Violence: Not At Risk (03/04/2024)   Humiliation, Afraid, Rape, and Kick questionnaire    Fear of Current or Ex-Partner: No    Emotionally Abused: No    Physically Abused: No    Sexually Abused: No    Family History  Problem Relation Age of Onset   Rectal cancer Mother  Colon cancer Mother    Pancreatic cancer Mother    Diabetes Mother    Breast cancer Mother 21   Breast cancer Maternal Aunt        breast   Birth defects Maternal Aunt    Irritable bowel syndrome Son    Heart disease Son        CAD, MV replacement   Allergic Disorder Daughter    Diabetes Daughter    Colon cancer Father    Colon polyps Father    Diabetes Father    Stroke Father    Heart disease Father        CHF   Hyperlipidemia Father    Hypertension Father    Arthritis Father    Breast cancer Paternal Aunt    Breast cancer Paternal Aunt    Arthritis Paternal Uncle    Allergic Disorder Daughter    Heart disease Cousin        CAD, had a  blood clot after stenting   Esophageal cancer Neg Hx    Stomach cancer Neg Hx     ROS: no fevers or chills, productive cough, hemoptysis, dysphasia, odynophagia, melena, hematochezia, dysuria, hematuria, rash, seizure activity, orthopnea, PND, pedal edema, claudication. Remaining systems are negative.  Physical Exam: Well-developed well-nourished in no acute distress.  Skin is warm and dry.  HEENT is normal.  Neck is supple.  Chest is clear to auscultation with normal expansion.  Cardiovascular exam is regular rate and rhythm.  Abdominal exam nontender or distended. No masses palpated. Extremities show no edema. neuro grossly intact  ECG- personally reviewed  A/P  1 history of chest pain-  2 dyspnea-  3 coronary artery disease-continue aspirin .  Intolerant to statins.  4 hyperlipidemia-continue Repatha .  5 hypertension-patient's blood pressure is controlled.  Continue present medical regimen.  Redell Shallow, MD

## 2024-07-22 ENCOUNTER — Ambulatory Visit

## 2024-07-23 ENCOUNTER — Ambulatory Visit: Attending: Cardiology | Admitting: Cardiology

## 2024-07-23 ENCOUNTER — Encounter: Payer: Self-pay | Admitting: Cardiology

## 2024-07-23 ENCOUNTER — Other Ambulatory Visit (INDEPENDENT_AMBULATORY_CARE_PROVIDER_SITE_OTHER)

## 2024-07-23 ENCOUNTER — Ambulatory Visit: Payer: Self-pay | Admitting: Gastroenterology

## 2024-07-23 VITALS — BP 120/50 | HR 74 | Ht 64.5 in | Wt 126.0 lb

## 2024-07-23 DIAGNOSIS — I10 Essential (primary) hypertension: Secondary | ICD-10-CM

## 2024-07-23 DIAGNOSIS — R072 Precordial pain: Secondary | ICD-10-CM | POA: Diagnosis not present

## 2024-07-23 DIAGNOSIS — E785 Hyperlipidemia, unspecified: Secondary | ICD-10-CM | POA: Diagnosis not present

## 2024-07-23 DIAGNOSIS — D649 Anemia, unspecified: Secondary | ICD-10-CM | POA: Diagnosis not present

## 2024-07-23 DIAGNOSIS — R0600 Dyspnea, unspecified: Secondary | ICD-10-CM | POA: Diagnosis not present

## 2024-07-23 DIAGNOSIS — D509 Iron deficiency anemia, unspecified: Secondary | ICD-10-CM | POA: Diagnosis not present

## 2024-07-23 DIAGNOSIS — E538 Deficiency of other specified B group vitamins: Secondary | ICD-10-CM | POA: Diagnosis not present

## 2024-07-23 DIAGNOSIS — E559 Vitamin D deficiency, unspecified: Secondary | ICD-10-CM

## 2024-07-23 DIAGNOSIS — N183 Chronic kidney disease, stage 3 unspecified: Secondary | ICD-10-CM | POA: Diagnosis not present

## 2024-07-23 DIAGNOSIS — R739 Hyperglycemia, unspecified: Secondary | ICD-10-CM | POA: Diagnosis not present

## 2024-07-23 DIAGNOSIS — I2510 Atherosclerotic heart disease of native coronary artery without angina pectoris: Secondary | ICD-10-CM

## 2024-07-23 LAB — COMPREHENSIVE METABOLIC PANEL WITH GFR
ALT: 9 U/L (ref 0–35)
AST: 15 U/L (ref 0–37)
Albumin: 4.1 g/dL (ref 3.5–5.2)
Alkaline Phosphatase: 91 U/L (ref 39–117)
BUN: 12 mg/dL (ref 6–23)
CO2: 32 meq/L (ref 19–32)
Calcium: 9.4 mg/dL (ref 8.4–10.5)
Chloride: 102 meq/L (ref 96–112)
Creatinine, Ser: 0.97 mg/dL (ref 0.40–1.20)
GFR: 55.75 mL/min — ABNORMAL LOW (ref >60.00)
Glucose, Bld: 94 mg/dL (ref 70–99)
Potassium: 4.4 meq/L (ref 3.5–5.1)
Sodium: 142 meq/L (ref 135–145)
Total Bilirubin: 0.4 mg/dL (ref 0.2–1.2)
Total Protein: 6.2 g/dL (ref 6.0–8.3)

## 2024-07-23 LAB — CBC WITH DIFFERENTIAL/PLATELET
Basophils Absolute: 0.1 K/uL (ref 0.0–0.1)
Basophils Relative: 1.3 % (ref 0.0–3.0)
Eosinophils Absolute: 0.2 K/uL (ref 0.0–0.7)
Eosinophils Relative: 1.9 % (ref 0.0–5.0)
HCT: 40.8 % (ref 36.0–46.0)
Hemoglobin: 13.4 g/dL (ref 12.0–15.0)
Lymphocytes Relative: 13.3 % (ref 12.0–46.0)
Lymphs Abs: 1.2 K/uL (ref 0.7–4.0)
MCHC: 32.8 g/dL (ref 30.0–36.0)
MCV: 92 fl (ref 78.0–100.0)
Monocytes Absolute: 0.7 K/uL (ref 0.1–1.0)
Monocytes Relative: 7.4 % (ref 3.0–12.0)
Neutro Abs: 6.9 K/uL (ref 1.4–7.7)
Neutrophils Relative %: 76.1 % (ref 43.0–77.0)
Platelets: 256 K/uL (ref 150.0–400.0)
RBC: 4.44 Mil/uL (ref 3.87–5.11)
RDW: 14.3 % (ref 11.5–15.5)
WBC: 9.1 K/uL (ref 4.0–10.5)

## 2024-07-23 LAB — MICROALBUMIN / CREATININE URINE RATIO
Creatinine,U: 193.9 mg/dL
Microalb Creat Ratio: 10.1 mg/g (ref 0.0–30.0)
Microalb, Ur: 2 mg/dL — ABNORMAL HIGH (ref 0.0–1.9)

## 2024-07-23 LAB — IBC + FERRITIN
Ferritin: 113.8 ng/mL (ref 10.0–291.0)
Iron: 62 ug/dL (ref 42–145)
Saturation Ratios: 26.7 % (ref 20.0–50.0)
TIBC: 232.4 ug/dL — ABNORMAL LOW (ref 250.0–450.0)
Transferrin: 166 mg/dL — ABNORMAL LOW (ref 212.0–360.0)

## 2024-07-23 LAB — TSH: TSH: 3.89 u[IU]/mL (ref 0.35–5.50)

## 2024-07-23 LAB — LIPID PANEL
Cholesterol: 157 mg/dL (ref 0–200)
HDL: 49.5 mg/dL (ref >39.00)
LDL Cholesterol: 76 mg/dL (ref 0–99)
NonHDL: 107.69
Total CHOL/HDL Ratio: 3
Triglycerides: 157 mg/dL — ABNORMAL HIGH (ref 0.0–149.0)
VLDL: 31.4 mg/dL (ref 0.0–40.0)

## 2024-07-23 LAB — VITAMIN B12: Vitamin B-12: 313 pg/mL (ref 211–911)

## 2024-07-23 LAB — VITAMIN D 25 HYDROXY (VIT D DEFICIENCY, FRACTURES): VITD: 29.99 ng/mL — ABNORMAL LOW (ref 30.00–100.00)

## 2024-07-23 LAB — IRON,TIBC AND FERRITIN PANEL
%SAT: 29 % (ref 16–45)
Ferritin: 134 ng/mL (ref 16–288)
Iron: 67 ug/dL (ref 45–160)
TIBC: 229 ug/dL — ABNORMAL LOW (ref 250–450)

## 2024-07-23 NOTE — Patient Instructions (Signed)
  Follow-Up: At [facility], you and your health needs are our priority.  As part of our continuing mission to provide you with exceptional heart care, our providers are all part of one team.  This team includes your primary Cardiologist (physician) and Advanced Practice Providers or APPs (Physician Assistants and Nurse Practitioners) who all work together to provide you with the care you need, when you need it.  Your next appointment:   6 month(s)  Provider:   Alexandria Angel, MD

## 2024-07-24 ENCOUNTER — Ambulatory Visit

## 2024-07-24 DIAGNOSIS — R2689 Other abnormalities of gait and mobility: Secondary | ICD-10-CM

## 2024-07-24 DIAGNOSIS — M25572 Pain in left ankle and joints of left foot: Secondary | ICD-10-CM

## 2024-07-24 DIAGNOSIS — M6281 Muscle weakness (generalized): Secondary | ICD-10-CM

## 2024-07-24 DIAGNOSIS — R2681 Unsteadiness on feet: Secondary | ICD-10-CM

## 2024-07-24 NOTE — Therapy (Signed)
 OUTPATIENT PHYSICAL THERAPY LOWER EXTREMITY TREATMENT   Patient Name: [name] MRN: [id number] DOB:1946-08-23, 78 y.o., female Today's Date: 07/24/2024  END OF SESSION:  PT End of Session - 07/24/24 1104     Visit Number 15    Number of Visits 25    Date for PT Re-Evaluation 07/26/24    Authorization Type [employer]    Progress Note Due on Visit 20    PT Start Time 1105    PT Stop Time 1120    PT Time Calculation (min) 15 min    Equipment Utilized During Treatment --    Activity Tolerance Other (comment)   see impression   Behavior During Therapy WFL for tasks assessed/performed            Past Medical History:  Diagnosis Date   Abdominal pain 10/26/2013   Allergic rhinitis 01/25/2016   ANA positive 07/29/2013   Anemia    Arthralgia 08/18/2013   Arthritis    Asthma, allergic 08/05/2012   Atrial flutter    past history- not current   Atypical chest pain 01/01/2015   Bilateral carpal tunnel syndrome 03/04/2015   CAD in native artery 08/25/2010   Minor CAD 2009, 2011, low risk Myoview  2013 and 2016        Cataract    bilaterally removed    Cellulitis of breast 02/05/2023   Chronic constipation    Chronic cough 01/09/2017   Chronic interstitial cystitis 02/01/2017   Chronic night sweats 01/08/2015   Common bile duct dilatation 02/13/2023   Concussion    x 3   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   DDD (degenerative disc disease), cervical 09/17/2018   Degenerative scoliosis 04/22/2018   Deviated septum 05/12/2016   Dysphagia    Dyspnea    Dysuria 03/09/2021   Elevated alkaline phosphatase level 01/25/2023   Essential hypertension 08/25/2010   External hemorrhoids 02/05/2015   Facial trauma 01/22/2023   Family history of adverse reaction to anesthesia    Family history of malignant hyperthermia    father had this   Fibrocystic breast disease 10/18/2010   Fibromyalgia    Frequency of urination    Generalized anxiety disorder 08/03/2010    GERD (gastroesophageal reflux disease)    Headache 10/13/2013   High serum vitamin D  05/18/2021   History of chronic bronchitis    History of colonic polyp 02/25/2019   History of hiatal hernia    History of tachycardia    CONTROLLED  WITH ATENOLOL    Hyperglycemia 03/08/2021   Hyperkalemia 09/28/2022   Hyperlipidemia    Intertrigo 12/13/2022   Kidney stone 02/26/2019   Knee pain, right 04/30/2015   Left hip pain 04/30/2015   Leg pain 02/24/2011   Qualifier: Diagnosis of   By: Antonio DOJamee         Leukocytosis 07/05/2022   Local reaction to hymenoptera sting 05/09/2023   Low back pain 09/08/2020   Major depressive disorder 02/05/2013   Malignant neoplasm of lower-outer quadrant of right breast of female, estrogen receptor positive 01/03/2013   Nasal turbinate hypertrophy 05/12/2016   Oral herpes 02/05/2023   OSA and COPD overlap syndrome 06/10/2020   Osteoporosis 01/2019   T score -2.2 stable/improved from prior study   Pelvic pain    Peripheral neuropathy 05/22/2014   Personal history of radiation therapy    Pneumonia    Primary insomnia 06/25/2017   Pulmonary nodules 02/12/2015   Rash and other nonspecific skin eruption 05/09/2023   Recurrent falls  05/02/2020   Recurrent sinusitis 02/25/2019   Right arm pain 07/20/2016   RLS (restless legs syndrome) 11/09/2019   RUQ pain 07/05/2022   S/P radiation therapy 11/12/12 - 12/05/12   right Breast   Sciatica 04/30/2015   Sepsis 2014   from UTI    Sjogren's disease 12/12/2021   Splenic lesion 09/02/2012   Sprain of lateral ligament of ankle joint 07/31/2023   Stage 3 chronic kidney disease    Statin intolerance 02/29/2020   Stricture and stenosis of esophagus 10/19/2011   Tongue lesion 02/05/2023   Tremor 07/05/2022   Urgency of urination    Vertigo 01/22/2023   Vitamin B12 deficiency 03/08/2021   Vocal cord cyst 05/18/2021   Past Surgical History:  Procedure Laterality Date   24 HOUR PH STUDY N/A 04/03/2016    Procedure: 24 HOUR PH STUDY;  Surgeon: Elspeth Deward Naval, MD;  Location: [facility];  Service: Gastroenterology;  Laterality: N/A;   ANKLE ARTHROSCOPY WITH RECONSTRUCTION Left 03/12/2024   Procedure: ANKLE ARTHROSCOPY WITH OPEN LATERAL ANKLE LIGAMENT STABILIZATION;  Surgeon: Barton Drape, MD;  Location: [facility];  Service: Orthopedics;  Laterality: Left;   BREAST BIOPSY Right 08/23/2012   ADH   BREAST BIOPSY Right 10/11/2012   Ductal Carcinoma   BREAST EXCISIONAL BIOPSY Right 09/04/2013   benign   BREAST LUMPECTOMY Right 10/11/2012   W/ SLN BX   CARDIAC CATHETERIZATION  09-13-2007  DR MORRIS   WELL-PRESERVED LVF/  DIFFUSE SCATTERED CORONARY CALCIFACATION AND ATHEROSCLEROSIS WITHOUT OBSTRUCTION   CARDIAC CATHETERIZATION  08-04-2010  DR MCALHANY   NON-OBSTRUCTIVE CAD/  pLAD 40%/  oLAD 30%/  mLAD 30%/  pRCA 30%/  EF 60%   CARDIOVASCULAR STRESS TEST  06-18-2012  DR McALHANY   LOW RISK NUCLEAR STUDY/  SMALL FIXED AREA OF MODERATELY DECREASED UPTAKE IN ANTEROSEPTAL WALL WHICH MAY BE ARTIFACTUAL/  NO ISCHEMIA/  EF 68%   COLONOSCOPY  09/29/2010   CYSTOSCOPY     CYSTOSCOPY WITH HYDRODISTENSION AND BIOPSY N/A 03/06/2014   Procedure: CYSTOSCOPY/HYDRODISTENSION/ INSTILATION OF MARCAINE  AND PYRIDIUM ;  Surgeon: Arlena LILLETTE Gal, MD;  Location: [facility];  Service: Urology;  Laterality: N/A;   CYSTOSCOPY/URETEROSCOPY/HOLMIUM LASER/STENT PLACEMENT Left 03/21/2023   Procedure: CYSTOSCOPY LEFT RETROGRADE PYELOGRAM, LEFT URETEROSCOPY, HOLMIUM LASER LITHOTRIPSYM, AND LEFT URETERAL STENT PLACEMENT;  Surgeon: Devere Lonni Righter, MD;  Location: [facility];  Service: Urology;  Laterality: Left;  60 MINUTES   ESOPHAGEAL MANOMETRY N/A 04/03/2016   Procedure: ESOPHAGEAL MANOMETRY (EM);  Surgeon: Elspeth Deward Naval, MD;  Location: [facility];  Service: Gastroenterology;  Laterality: N/A;   EXTRACORPOREAL SHOCK WAVE LITHOTRIPSY Left 02/06/2019   Procedure:  EXTRACORPOREAL SHOCK WAVE LITHOTRIPSY (ESWL);  Surgeon: Ottelin, Mark, MD;  Location: [facility];  Service: Urology;  Laterality: Left;   NASAL SINUS SURGERY  1985   ORIF RIGHT ANKLE  FX  2006   POLYPECTOMY     REMOVAL VOCAL CORD CYST  01/2013   REPAIR OF PERONEUS BREVIS TENDON Left 03/12/2024   Procedure: REPAIR OF PERONEUS TENDON;  Surgeon: Barton Drape, MD;  Location: [facility];  Service: Orthopedics;  Laterality: Left;   RIGHT BREAST BX  08/23/2012   RIGHT HAND SURGERY  X3  LAST ONE 2009   INCLUDES  ORIF RIGHT 5TH FINGER AND REVISION TWICE   SKIN CANCER EXCISION     face,arms   TONSILLECTOMY AND ADENOIDECTOMY  AGE 16   TOTAL ABDOMINAL HYSTERECTOMY W/ BILATERAL SALPINGOOPHORECTOMY  1982   W/  APPENDECTOMY  TRANSTHORACIC ECHOCARDIOGRAM  06/24/2012   GRADE I DIASTOLIC DYSFUNCTION/  EF 55-60%/  MILD MR   UPPER GASTROINTESTINAL ENDOSCOPY     Patient Active Problem List   Diagnosis Date Noted   Pedal edema 05/20/2024   Otalgia of both ears 04/23/2024   Tinnitus of both ears 04/23/2024   Hypokalemia 12/10/2023   IDA (iron deficiency anemia) 09/12/2023   Stage 3 chronic kidney disease    Right ankle pain 07/31/2023   Common bile duct dilatation 02/13/2023   Oral herpes 02/05/2023   Elevated alkaline phosphatase level 01/25/2023   Vertigo 01/22/2023   Intertrigo 12/13/2022   Hyperkalemia 09/28/2022   Anemia 09/28/2022   Tremor 07/05/2022   RUQ pain 07/05/2022   Leukocytosis 07/05/2022   Sjogren's disease 12/12/2021   Vocal cord cyst 05/18/2021   Vitamin D  deficiency 05/18/2021   Dysuria 03/09/2021   Vitamin B12 deficiency 03/08/2021   Hyperglycemia 03/08/2021   OSA and COPD overlap syndrome 06/10/2020   Recurrent falls 05/02/2020   Statin intolerance 02/29/2020   RLS (restless legs syndrome) 11/09/2019   Recurrent sinusitis 02/25/2019   History of colonic polyp 02/25/2019   DDD (degenerative disc disease), cervical 09/17/2018   Degenerative scoliosis  04/22/2018   Primary insomnia 06/25/2017   Chronic interstitial cystitis 02/01/2017   Deviated nasal septum 05/12/2016   Hypertrophy of nasal turbinates 05/12/2016   Dysphagia    Allergic rhinitis 01/25/2016   Dyspnea    Sciatica 04/30/2015   Bilateral carpal tunnel syndrome 03/04/2015   Hyperlipidemia 03/01/2015   Pulmonary nodules 02/12/2015   External hemorrhoids 02/05/2015   Chronic night sweats 01/08/2015   Peripheral neuropathy 05/22/2014   Headache 10/13/2013   Arthralgia 08/18/2013   ANA positive 07/29/2013   Depression with anxiety 02/05/2013   Malignant neoplasm of lower-outer quadrant of right breast of female, estrogen receptor positive 01/03/2013   Asthma, allergic 08/05/2012   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   GERD (gastroesophageal reflux disease) 06/03/2012   Stricture and stenosis of esophagus 10/19/2011   Osteoporosis 04/17/2011   Fibrocystic breast disease 10/18/2010   Essential hypertension 08/25/2010   CAD in native artery 08/25/2010   Generalized anxiety disorder 08/03/2010   Bronchitis 03/10/2010   Fibromyalgia 01/11/2010   PCP: Domenica Harlene LABOR, MD  REFERRING PROVIDER: Barton Drape, MD  REFERRING DIAG: [phone] (ICD-10-CM) - Peroneal tendinitis, left leg  THERAPY DIAG:  No diagnosis found.  Rationale for Evaluation and Treatment: Rehabilitation  ONSET DATE: 03/12/24  SUBJECTIVE:  SUBJECTIVE STATEMENT: Patient was seen by cardiologist yesterday due to edema and SOB. Was recommended to wear compression stockings, but otherwise no concerning findings and was recommended to f/u in 6 months. Recent blood work showed that her iron is in overload. Patient reports she feels bad stating she feels more tired than normal and does not have much of an appetite stating she has been feeling this way for a couple days. Denies any fever, chills. Reports body aches and nausea. Patient reports her ankle is overall doing better  PERTINENT HISTORY: S/p Lt ankle  arthroscopy Brostrom stabilization, peroneus brevis and longus debridement and tenosynovectomy 03/12/24 See extensive PMH above   PAIN:  Are you having pain? Yes: NPRS scale: none currently at worst 3-4/10 Pain location: Lt midfoot  Pain description: feels like it is hard stretching. Aggravating factors: overactivity Relieving factors: rest  PRECAUTIONS: Fall,progress per protocol   WEIGHT BEARING RESTRICTIONS: Yes LLE WBAT In ASO brace   FALLS:  Has patient fallen in last 6 months? Yes. Number of  falls 2 (fell stepping up grassy hill, fell in her kitchen from turning quickly)  LIVING ENVIRONMENT: Lives with: lives with their spouse Lives in: House/apartment Stairs: Yes: Internal: 16 steps; on left going up Has following equipment at home: Single point cane  PATIENT GOALS: I want to be able to walk again.  NEXT MD VISIT: November 2025   OBJECTIVE:  Note: Objective measures were completed at Evaluation unless otherwise noted.  DIAGNOSTIC FINDINGS: none on file   PATIENT SURVEYS:  FADI: 46/104; 44% 06/26/24: FADI: 67/100; 67%  COGNITION: Overall cognitive status: Within functional limits for tasks assessed     SENSATION: See palpation  EDEMA:  Mild swelling about Lt ankle   PALPATION: Hypersensitivity to palpation about distal fibula, lateral malleolus   LOWER EXTREMITY ROM: Active ROM Right eval Left eval 05/05/24 Left  05/20/24 Left  06/03/24 Left   Hip flexion       Hip extension       Hip abduction       Hip adduction       Hip internal rotation       Hip external rotation       Knee flexion       Knee extension       Ankle dorsiflexion  6 9  10   Ankle plantarflexion  50  65 67  Ankle inversion  15  15 17   Ankle eversion  8  8 9    (Blank rows = not tested)  LOWER EXTREMITY MMT: MMT Right eval Left eval 06/26/24 Left   Hip flexion     Hip extension     Hip abduction     Hip adduction     Hip internal rotation     Hip external rotation      Knee flexion     Knee extension     Ankle dorsiflexion  3+ 4+  Ankle plantarflexion  3+ sitting 4- in sitting  Ankle inversion  3+ 4-  Ankle eversion  3+ 4   (Blank rows = not tested)  FUNCTIONAL TESTS:  TUG: 13.5 seconds Romberg x 30 seconds Semi-tandem unable   06/09/24: TUG: 11.1 seconds   06/26/24: tandem 1 second   GAIT: Distance walked: 20 ft  Assistive device utilized: Single point cane. Ankle brace Level of assistance: Modified independence Comments: limited push-off LLE, foot ER,  [facility] Adult PT Treatment:                                                DATE: 07/24/24 BP: 112/74 HR: 70    Self Care: Educated patient on blood pressure findings, recommended to monitor vitals at home Continue with fluids and eating as tolerated  F/u with PCP if symptoms worsen or do not improve    [facility] Adult PT Treatment:                                                DATE: 07/10/24 Therapeutic Exercise: Calf raise on leg press 2 x 10 @ 40 lbs; DL  LAQ 2 x 10 @ 2 lbs  Seated HS curl 2 x 10 red band   Therapeutic Activity: Step up knee driver 6 inch step; single UE support x 10  Outdoor walking with SPC on inclined surfaces and curb negotiation. Working on Youth worker on uneven/inclined surfaces.    [facility] Adult PT Treatment:                                                DATE: 07/07/24 Dizziness=4-5/10 all around everything doesn't seem clear Therapeutic Exercise: Supine AROM neck rotation  Standing Rocking anteriorly with each LE supported on 8 surface Step ups into 4 leading with L LE x 10 reps Step ups laterally 4 step with L LE x 10 reps Mini heel raise-- within tolerable range of motion x 6 reps Manual Therapy: Gentle cervical traction and PROM due to dizziness and reports of neck tension and HA Ankle metatarsal grade II joint mobilization Subtalar mobilization grade I Neuromuscular re-ed: With neck AROM, patient gets a sensation of  her eyes flashing PT recommended she keep eyes closed during activity today due to increased light sensitivity Single limb standing dec'ing UE support with CGA for safety Oculomotor Convergence to approximately 7inches with sensation of eye strain Habituation Gets dizziness to R only with neck AROM rotation that resolves within 3 repetitions    [facility] Adult PT Treatment:                                                DATE: 07/03/24 Therapeutic Exercise: NuStep level 5 x 4 minutes UE/LE  Resisted plantarflexion 2 x10; red band  Resisted ankle dorsiflexion 2 x 10; red band  HEP review   Neuromuscular re-ed: Habituation Seated horizontal head turns to the Rt with increased velocity 2 x 5 eyes open Seated nose to knee x 5 each Gaze adaption VOR x 1 horizontal; 2 x 10, slow speed     [facility] Adult PT Treatment:                                                DATE: 06/30/24 Therapeutic Exercise: Seated Ball roll plantar surface Toe flexion/extension over ball Hamstring stretch x 30 seconds L and R - with foot supported on table Hamstring stretch x 30 seconds L and R with foot on floor Piriformis stretch x 30 seconds L and R Sit<>stand x 5 reps with wider base of support Sit<>stand with eyes closed with CGA for safety Manual Therapy: STM L soleous Gentle grade I subtallar mobilization and metatarsal mobilizations Scar tissue mobilization lateral ankle Neuromuscular re-ed: Habituation Seated horizontal head turns x 8 reps with eyes open Seated horizontal head turns x 8 reps with eyes closed Gaze adaptation X 1 viewing at slow pace x 5 reps Then worked on more ROM to R side Had patient begin with approx 20 degrees of head motion (small amplitude) and increase as she tolerated more Gait: Gait without L ankle ASO-- with patient noting significant decrease in pain after STM    [facility] Adult PT Treatment:  DATE: 06/26/24 Therapeutic  Exercise: NuStep level 5 x 5 minutes UE/LE  Neuromuscular re-ed: Romberg 2 x 30 sec  Romberg eyes closed unable  normal stance eyes closed 2 x 30 sec Romberg vertical head turns immediate dizziness when returning to neutral from flexed position so discontinued  Normal stance on foam 2 x 30 sec  Romberg on foam x 30 sec  Therapeutic Activity: Re-assessment to determine overall progress, educating patient on progress towards goals.   PATIENT EDUCATION:  Education details:see treatment Person educated: Patient Education method: Explanation,  Education comprehension: verbalized understanding,  HOME EXERCISE PROGRAM: Access Code: [email or web address] URL: [email or web address] Date: 06/24/2024 Prepared by: Lucie Meeter  Exercises - Long Sitting Calf Stretch with Strap  - 1 x daily - 7 x weekly - 3 sets - 30s ec  hold - Seated Ankle Circles  - 1 x daily - 7 x weekly - 2 sets - 10 reps - Isometric Ankle Eversion at Wall  - 1 x daily - 7 x weekly - 2 sets - 10 reps - Seated Plantar Fascia Mobilization with Small Ball  - 1 x daily - 7 x weekly - 3-5 minute  hold - Seated Great Toe Extension  - 1 x daily - 7 x weekly - 2 sets - 10 reps - Long Sitting Ankle Plantar Flexion with Resistance  - 1 x daily - 7 x weekly - 2 sets - 10 reps - Long Sitting Ankle Inversion with Resistance  - 1 x daily - 7 x weekly - 2 sets - 10 reps - Seated Calf Raise with Weights on Thighs  - 1 x daily - 7 x weekly - 2 sets - 10 reps - Side to Side Weight Shift with Counter Support  - 1 x daily - 7 x weekly - 2 sets - 10 reps - Staggered Stance Forward Backward Weight Shift with Counter Support  - 1 x daily - 7 x weekly - 2 sets - 10 reps - Toe Raises with Counter Support  - 1 x daily - 7 x weekly - 2 sets - 10 reps - Towel Scrunches  - 1 x daily - 7 x weekly - 1 sets - 5 reps - Rolling rightleft sides for vestibular habituation  - 1-2 x daily - 7 x weekly - 1 sets - 3-5 reps - Seated Right Head Turns  Vestibular Habituation  - 1-2 x daily - 7 x weekly - 1 sets - 5 reps - Standing Hip Abduction with Counter Support  - 1 x daily - 7 x weekly - 2 sets - 10 repss  ASSESSMENT:  CLINICAL IMPRESSION: [name] arrived to PT session stating that she did not feel right. She reports symptoms of fatigue, nausea, and body aches over the past two days. Vitals were assessed and WNL (see above). Given these symptoms we decided to hold on PT and instructed patient on continuing to remain hydrated and eat as able with recommendation to seek medical care if symptoms worsen/do not improve. Patient verbalized understanding.   EVAL: Patient is a 77 y.o. female who was seen today for physical therapy evaluation and treatment for s/p Lt ankle arthroscopy Brostrom stabilization, peroneus brevis and longus debridement and tenosynovectomy on 03/12/24. She demonstrates ROM, strength, gait and balance deficits that are consistent with her recent post-operative status. She will benefit from skilled PT to address the above stated deficits in order to return to optimal function.   OBJECTIVE IMPAIRMENTS: Abnormal gait, decreased activity tolerance, decreased balance, decreased endurance,  decreased knowledge of condition, decreased mobility, difficulty walking, decreased ROM, decreased strength, increased edema, impaired flexibility, impaired sensation, improper body mechanics, postural dysfunction, and pain.   GOALS: Goals reviewed with patient? Yes  SHORT TERM GOALS: Target date: 06/09/2024  Patient will be independent and compliant with initial HEP.  Baseline: issued at eval  Goal status: MET  2.  Patient will demonstrate at least 60 degrees of Lt ankle plantarflexion AROM to improve push-off during gait cycle.  Baseline: see above Goal status: MET  3.  Patient will demonstrate at least 10 degrees of Lt ankle dorsiflexion AROM to improve gait mechanics.  Baseline: see above Goal status: MET  4.  Patient will complete  TUG in </= 12 seconds to reduce fall risk.  Baseline: see above Goal status: MET  LONG TERM GOALS: Target date: 07/26/24  Patient will score >/= 60/104 on the Foot and Ankle Disability Index (MDC 7) to signify clinically meaningful improvement in functional abilities.  Baseline: see above Goal status: MET  2.  Patient will maintain tandem stance for at least 10 seconds to improve gait stability.  Baseline: see above 06/26/24: tandem 1 second Goal status: progressing   3.  Patient will be modified independent with curb/stair negotiation.  Baseline: uses cane and requires support from spouse  06/26/24: requires cane  Goal status: MET  4.  Patient will self-report ability to tolerate at least 30 minutes of standing/walking activity.  Baseline: 10 minutes  06/26/24: 20 minutes  Goal status: progressing   5.  Patient will demonstrate at least 4/5 Lt ankle strength to improve stability when navigating on uneven terrain.  Baseline: see above  Goal status: partially met   6.  Patient will be independent with advanced home program to progress/maintain current level of function.  Baseline: initial HEP issued  Goal status: progressing    7. Patient will tolerate rolling R and L without d/o dizziness.   Baseline: 4/10 dizzines   Goal status: INITIAL   8. Patient will tolerate gaze x 1 viewing in sitting x 20 seconds to work towards improved tolerance to turns in standing.   Baseline: Unable to do 2 reps in sitting   Goal status: INITIAL  PLAN:  PT FREQUENCY: 2x/week  PT DURATION: 12 weeks  PLANNED INTERVENTIONS: [id number]- PT Re-evaluation, 97750- Physical Performance Testing, 97110-Therapeutic exercises, 97530- Therapeutic activity, [id number]- Neuromuscular re-education, 97535- Self Care, [id number]- Manual therapy, [id number]- Gait training, [id number]- Aquatic Therapy, [id number]- Vasopneumatic device, Taping, Dry Needling, Cryotherapy, and Moist heat  PLAN FOR NEXT SESSION: review and progress HEP; ankle AROM  and strengthening as tolerated; static balance. For vestibular: Progress habituation (seated head turns>standing head turns), add brandt daroff habituation, work on gaze as tolerated in clinic with cues,  Consider BBQ roll for canolith repositioning to R if symptoms remain after habituation (responded well to habituation in clinic and may not need). Convergence-- give for HEP (only provoking mild sxs); RE-EVAL  Grantland Want, PT, DPT, ATC 07/24/24 11:31 AM

## 2024-07-28 ENCOUNTER — Encounter: Payer: Self-pay | Admitting: Professional

## 2024-07-28 ENCOUNTER — Ambulatory Visit (INDEPENDENT_AMBULATORY_CARE_PROVIDER_SITE_OTHER): Admitting: Professional

## 2024-07-28 DIAGNOSIS — M5416 Radiculopathy, lumbar region: Secondary | ICD-10-CM | POA: Diagnosis not present

## 2024-07-28 DIAGNOSIS — M542 Cervicalgia: Secondary | ICD-10-CM | POA: Diagnosis not present

## 2024-07-28 DIAGNOSIS — M51361 Other intervertebral disc degeneration, lumbar region with lower extremity pain only: Secondary | ICD-10-CM | POA: Diagnosis not present

## 2024-07-28 DIAGNOSIS — F331 Major depressive disorder, recurrent, moderate: Secondary | ICD-10-CM | POA: Diagnosis not present

## 2024-07-28 DIAGNOSIS — Z79899 Other long term (current) drug therapy: Secondary | ICD-10-CM | POA: Diagnosis not present

## 2024-07-28 DIAGNOSIS — F4310 Post-traumatic stress disorder, unspecified: Secondary | ICD-10-CM

## 2024-07-28 DIAGNOSIS — M549 Dorsalgia, unspecified: Secondary | ICD-10-CM | POA: Diagnosis not present

## 2024-07-28 DIAGNOSIS — Z9889 Other specified postprocedural states: Secondary | ICD-10-CM | POA: Diagnosis not present

## 2024-07-28 DIAGNOSIS — F411 Generalized anxiety disorder: Secondary | ICD-10-CM | POA: Diagnosis not present

## 2024-07-28 NOTE — Telephone Encounter (Signed)
 Patient has been seen.

## 2024-07-28 NOTE — Progress Notes (Signed)
 [facility] Counselor/Therapist Progress Note  Patient ID: AMARACHUKWU LAKATOS, MRN: [id number],    Date: 07/28/2024  Time Spent: 44 minutes 11-1144am   Treatment Type: Individual Therapy  Risk Assessment: Danger to Self:  No Self-injurious Behavior: No Danger to Others: No  Subjective: This session was held via video teletherapy. The patient consented to video teletherapy and was located in her home during this session. She is aware it is the responsibility of the patient to secure confidentiality on her end of the session. The provider was in a private home office for the duration of this session.    The patient arrived on time for the [name] appointment.   Issues addressed: 1-mood -doing pretty good 2-physical -still in treatment -has struggled a little since our last session but is feeling better 3-relationship with children a-Shelly came down last weekend -had a good time -got some really good pictures of them together -she asked [place] about her spiritual condition and if she will see her again in heaven -Shelly doesn't agree with her meaning of heaven -science proves that that is not so -it was not what pt wanted to hear and she should have been prepared because she asked the question -pt said the Lord laid it on her to talk to her -pt broke down and cried -daughter believes that we came [name] and [place] -coming for weekend  Treatment Plan Problems: anxiety, depression, family conflict Goals: Alleviate depressive symptoms Recognize, accept, and cope with depressive feelings Develop healthy thinking patterns Develop healthy interpersonal relationships (particularly younger daughter) Reduce overall frequency, intensity, and duration of anxiety Stabilize anxiety level while increasing ability to function Enhance ability to effectively cope with full variety of stressors Learn and implement coping skills that result in a reduction of  anxiety   Objectives target date for all objectives is 09/26/2024: Verbalize an understanding of the cognitive, physiological, and behavioral components of anxiety                               60     75 Learning and implement calming skills to reduce overall anxiety                       60     75 Verbalize an understanding of the role that cognitive biases play in excessive irrational worry and persistent anxiety symptoms 70     85+ Identify, challenge, and replace biased fearful self-talk  60     80+ Learn and implement problem solving strategies   70     95 Identify and engage in pleasant activities    80     90 Learning and implement personal and interpersonal skills to reduce anxiety and improve interpersonal relationships   60     85 Learn to accept limitations in life and commit to tolerating, rather than avoiding, unpleasant emotions while accomplishing meaningful goal 60 85 Identify major life conflicts from the past and present that form the basis for present anxiety      70 90+     Maintain involvement in work, family, and social activities 70    90     Reestablish a consistent sleep-wake cycle   70    75 Verbalize an accurate understanding of depression  60    85 Identify and replace thoughts that support depression  80    100 Learn and implement behavioral strategies  60    85 Verbalize an understanding and resolution of current interpersonal problems       23    30 Learn and implement decision making skills   60    105 Learn and implement conflict resolution skills to resolve interpersonal problems       60    85 Verbalize an understanding of healthy and unhealthy emotions verbalize insight into how past relationships may be influence current experiences with depression                               60    80 Use mindfulness and acceptance strategies and increase value based behavior        60    80 Increase hopeful statements about the future.    60    85 Interventions: Engage  the patient in behavioral activation Use instruction, modeling, and role-playing to build the patient 's general social, communication, and/or conflict resolution skills Use Acceptance and Commitment Therapy to help patient  accept uncomfortable realities in order to accomplish value-consistent goals Reinforce the patient 's insight into the role of her past emotional pain and present anxiety  Support the patient in following through with work, family, and social activities Teach and implement sleep hygiene practices  Teach the patient relaxation skills Assign the patient homework Discuss examples demonstrating that unrealistic worry overestimates the probability of threats and underestimates patient's ability  Assist the patient in analyzing his or her worries Help patient understand that avoidance is reinforcing  Consistent with treatment model, discuss how change in cognitive, behavioral, and interpersonal can help patient alleviate depression CBT Behavioral activation to help the patient explore the relationship, nature of the dispute Help the patient develop new interpersonal skills and relationships Conduct Problem solving therapy Teach conflict resolution skills Use a process-experiential approach Conduct ACT  Diagnosis:Major depressive disorder, recurrent episode, moderate (HCC)  Generalized anxiety disorder  PTSD (post-traumatic stress disorder)  Plan:  -meet on Tuesday, August 19, 2024 at [employer].

## 2024-08-04 ENCOUNTER — Ambulatory Visit: Attending: Physician Assistant

## 2024-08-04 DIAGNOSIS — M25572 Pain in left ankle and joints of left foot: Secondary | ICD-10-CM | POA: Diagnosis not present

## 2024-08-04 DIAGNOSIS — R2681 Unsteadiness on feet: Secondary | ICD-10-CM | POA: Insufficient documentation

## 2024-08-04 DIAGNOSIS — M5416 Radiculopathy, lumbar region: Secondary | ICD-10-CM | POA: Insufficient documentation

## 2024-08-04 DIAGNOSIS — R2689 Other abnormalities of gait and mobility: Secondary | ICD-10-CM | POA: Insufficient documentation

## 2024-08-04 DIAGNOSIS — R42 Dizziness and giddiness: Secondary | ICD-10-CM | POA: Insufficient documentation

## 2024-08-04 DIAGNOSIS — M6281 Muscle weakness (generalized): Secondary | ICD-10-CM | POA: Diagnosis not present

## 2024-08-04 NOTE — Therapy (Signed)
 OUTPATIENT PHYSICAL THERAPY TREATMENT RE-CERTIFICATION   Patient Name: [name] MRN: [id number] DOB:[birth year], 78 y.o., female Today's Date: 08/04/2024  END OF SESSION:  PT End of Session - 08/04/24 1445     Visit Number 16    Number of Visits 28    Date for PT Re-Evaluation 09/20/24    Authorization Type [employer]    Progress Note Due on Visit 20    PT Start Time 1445    PT Stop Time 1530    PT Time Calculation (min) 45 min    Activity Tolerance Patient tolerated treatment well    Behavior During Therapy [facility] for tasks assessed/performed             Past Medical History:  Diagnosis Date   Abdominal pain 10/26/2013   Allergic rhinitis 01/25/2016   ANA positive 07/29/2013   Anemia    Arthralgia 08/18/2013   Arthritis    Asthma, allergic 08/05/2012   Atrial flutter    past history- not current   Atypical chest pain 01/01/2015   Bilateral carpal tunnel syndrome 03/04/2015   CAD in native artery 08/25/2010   Minor CAD 2009, 2011, low risk Myoview  2013 and 2016        Cataract    bilaterally removed    Cellulitis of breast 02/05/2023   Chronic constipation    Chronic cough 01/09/2017   Chronic interstitial cystitis 02/01/2017   Chronic night sweats 01/08/2015   Common bile duct dilatation 02/13/2023   Concussion    x 3   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   DDD (degenerative disc disease), cervical 09/17/2018   Degenerative scoliosis 04/22/2018   Deviated septum 05/12/2016   Dysphagia    Dyspnea    Dysuria 03/09/2021   Elevated alkaline phosphatase level 01/25/2023   Essential hypertension 08/25/2010   External hemorrhoids 02/05/2015   Facial trauma 01/22/2023   Family history of adverse reaction to anesthesia    Family history of malignant hyperthermia    father had this   Fibrocystic breast disease 10/18/2010   Fibromyalgia    Frequency of urination    Generalized anxiety disorder 08/03/2010   GERD (gastroesophageal reflux disease)     Headache 10/13/2013   High serum vitamin D  05/18/2021   History of chronic bronchitis    History of colonic polyp 02/25/2019   History of hiatal hernia    History of tachycardia    CONTROLLED  WITH ATENOLOL    Hyperglycemia 03/08/2021   Hyperkalemia 09/28/2022   Hyperlipidemia    Intertrigo 12/13/2022   Kidney stone 02/26/2019   Knee pain, right 04/30/2015   Left hip pain 04/30/2015   Leg pain 02/24/2011   Qualifier: Diagnosis of   By: Antonio DOJamee         Leukocytosis 07/05/2022   Local reaction to hymenoptera sting 05/09/2023   Low back pain 09/08/2020   Major depressive disorder 02/05/2013   Malignant neoplasm of lower-outer quadrant of right breast of female, estrogen receptor positive 01/03/2013   Nasal turbinate hypertrophy 05/12/2016   Oral herpes 02/05/2023   OSA and COPD overlap syndrome 06/10/2020   Osteoporosis 01/2019   T score -2.2 stable/improved from prior study   Pelvic pain    Peripheral neuropathy 05/22/2014   Personal history of radiation therapy    Pneumonia    Primary insomnia 06/25/2017   Pulmonary nodules 02/12/2015   Rash and other nonspecific skin eruption 05/09/2023   Recurrent falls 05/02/2020   Recurrent sinusitis 02/25/2019   Right  arm pain 07/20/2016   RLS (restless legs syndrome) 11/09/2019   RUQ pain 07/05/2022   S/P radiation therapy 11/12/12 - 12/05/12   right Breast   Sciatica 04/30/2015   Sepsis 2014   from UTI    Sjogren's disease 12/12/2021   Splenic lesion 09/02/2012   Sprain of lateral ligament of ankle joint 07/31/2023   Stage 3 chronic kidney disease    Statin intolerance 02/29/2020   Stricture and stenosis of esophagus 10/19/2011   Tongue lesion 02/05/2023   Tremor 07/05/2022   Urgency of urination    Vertigo 01/22/2023   Vitamin B12 deficiency 03/08/2021   Vocal cord cyst 05/18/2021   Past Surgical History:  Procedure Laterality Date   24 HOUR PH STUDY N/A 04/03/2016   Procedure: 24 HOUR PH STUDY;  Surgeon:  Elspeth Deward Naval, MD;  Location: [facility];  Service: Gastroenterology;  Laterality: N/A;   ANKLE ARTHROSCOPY WITH RECONSTRUCTION Left 03/12/2024   Procedure: ANKLE ARTHROSCOPY WITH OPEN LATERAL ANKLE LIGAMENT STABILIZATION;  Surgeon: Barton Drape, MD;  Location: [facility];  Service: Orthopedics;  Laterality: Left;   BREAST BIOPSY Right 08/23/2012   ADH   BREAST BIOPSY Right 10/11/2012   Ductal Carcinoma   BREAST EXCISIONAL BIOPSY Right 09/04/2013   benign   BREAST LUMPECTOMY Right 10/11/2012   W/ SLN BX   CARDIAC CATHETERIZATION  09-13-2007  DR MORRIS   WELL-PRESERVED LVF/  DIFFUSE SCATTERED CORONARY CALCIFACATION AND ATHEROSCLEROSIS WITHOUT OBSTRUCTION   CARDIAC CATHETERIZATION  08-04-2010  DR MCALHANY   NON-OBSTRUCTIVE CAD/  pLAD 40%/  oLAD 30%/  mLAD 30%/  pRCA 30%/  EF 60%   CARDIOVASCULAR STRESS TEST  06-18-2012  DR McALHANY   LOW RISK NUCLEAR STUDY/  SMALL FIXED AREA OF MODERATELY DECREASED UPTAKE IN ANTEROSEPTAL WALL WHICH MAY BE ARTIFACTUAL/  NO ISCHEMIA/  EF 68%   COLONOSCOPY  09/29/2010   CYSTOSCOPY     CYSTOSCOPY WITH HYDRODISTENSION AND BIOPSY N/A 03/06/2014   Procedure: CYSTOSCOPY/HYDRODISTENSION/ INSTILATION OF MARCAINE  AND PYRIDIUM ;  Surgeon: Arlena LILLETTE Gal, MD;  Location: [facility];  Service: Urology;  Laterality: N/A;   CYSTOSCOPY/URETEROSCOPY/HOLMIUM LASER/STENT PLACEMENT Left 03/21/2023   Procedure: CYSTOSCOPY LEFT RETROGRADE PYELOGRAM, LEFT URETEROSCOPY, HOLMIUM LASER LITHOTRIPSYM, AND LEFT URETERAL STENT PLACEMENT;  Surgeon: Devere Lonni Righter, MD;  Location: [facility];  Service: Urology;  Laterality: Left;  60 MINUTES   ESOPHAGEAL MANOMETRY N/A 04/03/2016   Procedure: ESOPHAGEAL MANOMETRY (EM);  Surgeon: Elspeth Deward Naval, MD;  Location: [facility];  Service: Gastroenterology;  Laterality: N/A;   EXTRACORPOREAL SHOCK WAVE LITHOTRIPSY Left 02/06/2019   Procedure: EXTRACORPOREAL SHOCK WAVE LITHOTRIPSY  (ESWL);  Surgeon: Ottelin, Mark, MD;  Location: [facility];  Service: Urology;  Laterality: Left;   NASAL SINUS SURGERY  1985   ORIF RIGHT ANKLE  FX  2006   POLYPECTOMY     REMOVAL VOCAL CORD CYST  01/2013   REPAIR OF PERONEUS BREVIS TENDON Left 03/12/2024   Procedure: REPAIR OF PERONEUS TENDON;  Surgeon: Barton Drape, MD;  Location: [facility];  Service: Orthopedics;  Laterality: Left;   RIGHT BREAST BX  08/23/2012   RIGHT HAND SURGERY  X3  LAST ONE 2009   INCLUDES  ORIF RIGHT 5TH FINGER AND REVISION TWICE   SKIN CANCER EXCISION     face,arms   TONSILLECTOMY AND ADENOIDECTOMY  AGE 68   TOTAL ABDOMINAL HYSTERECTOMY W/ BILATERAL SALPINGOOPHORECTOMY  1982   W/  APPENDECTOMY   TRANSTHORACIC ECHOCARDIOGRAM  06/24/2012   GRADE I DIASTOLIC  DYSFUNCTION/  EF 55-60%/  MILD MR   UPPER GASTROINTESTINAL ENDOSCOPY     Patient Active Problem List   Diagnosis Date Noted   Pedal edema 05/20/2024   Otalgia of both ears 04/23/2024   Tinnitus of both ears 04/23/2024   Hypokalemia 12/10/2023   IDA (iron deficiency anemia) 09/12/2023   Stage 3 chronic kidney disease    Right ankle pain 07/31/2023   Common bile duct dilatation 02/13/2023   Oral herpes 02/05/2023   Elevated alkaline phosphatase level 01/25/2023   Vertigo 01/22/2023   Intertrigo 12/13/2022   Hyperkalemia 09/28/2022   Anemia 09/28/2022   Tremor 07/05/2022   RUQ pain 07/05/2022   Leukocytosis 07/05/2022   Sjogren's disease 12/12/2021   Vocal cord cyst 05/18/2021   Vitamin D  deficiency 05/18/2021   Dysuria 03/09/2021   Vitamin B12 deficiency 03/08/2021   Hyperglycemia 03/08/2021   OSA and COPD overlap syndrome 06/10/2020   Recurrent falls 05/02/2020   Statin intolerance 02/29/2020   RLS (restless legs syndrome) 11/09/2019   Recurrent sinusitis 02/25/2019   History of colonic polyp 02/25/2019   DDD (degenerative disc disease), cervical 09/17/2018   Degenerative scoliosis 04/22/2018   Primary insomnia 06/25/2017    Chronic interstitial cystitis 02/01/2017   Deviated nasal septum 05/12/2016   Hypertrophy of nasal turbinates 05/12/2016   Dysphagia    Allergic rhinitis 01/25/2016   Dyspnea    Sciatica 04/30/2015   Bilateral carpal tunnel syndrome 03/04/2015   Hyperlipidemia 03/01/2015   Pulmonary nodules 02/12/2015   External hemorrhoids 02/05/2015   Chronic night sweats 01/08/2015   Peripheral neuropathy 05/22/2014   Headache 10/13/2013   Arthralgia 08/18/2013   ANA positive 07/29/2013   Depression with anxiety 02/05/2013   Malignant neoplasm of lower-outer quadrant of right breast of female, estrogen receptor positive 01/03/2013   Asthma, allergic 08/05/2012   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   GERD (gastroesophageal reflux disease) 06/03/2012   Stricture and stenosis of esophagus 10/19/2011   Osteoporosis 04/17/2011   Fibrocystic breast disease 10/18/2010   Essential hypertension 08/25/2010   CAD in native artery 08/25/2010   Generalized anxiety disorder 08/03/2010   Bronchitis 03/10/2010   Fibromyalgia 01/11/2010   PCP: Domenica Harlene LABOR, MD  REFERRING PROVIDER: Barton Drape, MD  REFERRING DIAG: [phone] (ICD-10-CM) - Peroneal tendinitis, left leg  THERAPY DIAG:  Muscle weakness (generalized)  Other abnormalities of gait and mobility  Unsteadiness on feet  Pain in left ankle and joints of left foot  Dizziness and giddiness  Rationale for Evaluation and Treatment: Rehabilitation  ONSET DATE: 03/12/24  SUBJECTIVE:  SUBJECTIVE STATEMENT: Patient reports her back is her chief complaint and is awaiting a referral for PT for this complaint. She does feel like her neuropathy is effecting both feet. Sometimes feels like little pins are sticking in the side of the ankle that happens when she is standing/walking. Patient reports her dizziness has been worse due to what she thinks is a sinus headache. Overall the ankle feels stronger.   PERTINENT HISTORY: S/p Lt ankle arthroscopy  Brostrom stabilization, peroneus brevis and longus debridement and tenosynovectomy 03/12/24 See extensive PMH above   PAIN:  Are you having pain? Yes: NPRS scale: 7/10 Pain location: Low back Pain description: sharp,consistent Aggravating factors: overactivity Relieving factors: rest  PRECAUTIONS: Fall,progress per protocol   WEIGHT BEARING RESTRICTIONS: No  FALLS:  Has patient fallen in last 6 months? Yes. Number of falls 2 (fell stepping up grassy hill, fell in her kitchen from turning quickly)  LIVING ENVIRONMENT: Lives with:  lives with their spouse Lives in: House/apartment Stairs: Yes: Internal: 16 steps; on left going up Has following equipment at home: Single point cane  PATIENT GOALS: I want to be able to walk again.  NEXT MD VISIT: November 2025   OBJECTIVE:  Note: Objective measures were completed at Evaluation unless otherwise noted.  DIAGNOSTIC FINDINGS: none on file   PATIENT SURVEYS:  FADI: 46/104; 44% 06/26/24: FADI: 67/100; 67%  COGNITION: Overall cognitive status: Within functional limits for tasks assessed     SENSATION: See palpation  EDEMA:  Mild swelling about Lt ankle   PALPATION: Hypersensitivity to palpation about distal fibula, lateral malleolus   LOWER EXTREMITY ROM: Active ROM Right eval Left eval 05/05/24 Left  05/20/24 Left  06/03/24 Left   Hip flexion       Hip extension       Hip abduction       Hip adduction       Hip internal rotation       Hip external rotation       Knee flexion       Knee extension       Ankle dorsiflexion  6 9  10   Ankle plantarflexion  50  65 67  Ankle inversion  15  15 17   Ankle eversion  8  8 9    (Blank rows = not tested)  LOWER EXTREMITY MMT: MMT Right eval Left eval 06/26/24 Left  08/04/24 Left   Hip flexion      Hip extension      Hip abduction      Hip adduction      Hip internal rotation      Hip external rotation      Knee flexion      Knee extension      Ankle dorsiflexion  3+  4+ 4+  Ankle plantarflexion  3+ sitting 4- in sitting 4 in sitting   Ankle inversion  3+ 4- 4  Ankle eversion  3+ 4 5   (Blank rows = not tested)  FUNCTIONAL TESTS:  TUG: 13.5 seconds Romberg x 30 seconds Semi-tandem unable   06/09/24: TUG: 11.1 seconds   06/26/24: tandem 1 second   GAIT: Distance walked: 20 ft  Assistive device utilized: Single point cane. Ankle brace Level of assistance: Modified independence Comments: limited push-off LLE, foot ER,  [facility] Adult PT Treatment:                                                DATE: 08/04/24 Therapeutic Exercise: Eccentric calf raise 2 x 10  Reviewed and updated HEP  Neuromuscular re-ed: Semi-tandem 2 x 30 sec each Romberg x 30 sec Romberg EC x 3 seconds Normal stance EC x 30 seconds Narrow stance EC x 19 seconds  Therapeutic Activity: Re-assessment to determine overall progress, educating patient on progress towards goals   Self Care: Recommended seeking care for what patient's believe is a sinus headache    [facility] Adult PT Treatment:                                                DATE: 07/24/24 BP: 112/74 HR: 70    Self Care: Educated patient on blood pressure findings, recommended  to monitor vitals at home Continue with fluids and eating as tolerated  F/u with PCP if symptoms worsen or do not improve    [facility] Adult PT Treatment:                                                DATE: 07/10/24 Therapeutic Exercise: Calf raise on leg press 2 x 10 @ 40 lbs; DL  LAQ 2 x 10 @ 2 lbs  Seated HS curl 2 x 10 red band   Therapeutic Activity: Step up knee driver 6 inch step; single UE support x 10  Outdoor walking with SPC on inclined surfaces and curb negotiation. Working on Youth worker on uneven/inclined surfaces.    [facility] Adult PT Treatment:                                                DATE: 07/07/24 Dizziness=4-5/10 all around everything doesn't seem clear Therapeutic  Exercise: Supine AROM neck rotation  Standing Rocking anteriorly with each LE supported on 8 surface Step ups into 4 leading with L LE x 10 reps Step ups laterally 4 step with L LE x 10 reps Mini heel raise-- within tolerable range of motion x 6 reps Manual Therapy: Gentle cervical traction and PROM due to dizziness and reports of neck tension and HA Ankle metatarsal grade II joint mobilization Subtalar mobilization grade I Neuromuscular re-ed: With neck AROM, patient gets a sensation of her eyes flashing PT recommended she keep eyes closed during activity today due to increased light sensitivity Single limb standing dec'ing UE support with CGA for safety Oculomotor Convergence to approximately 7inches with sensation of eye strain Habituation Gets dizziness to R only with neck AROM rotation that resolves within 3 repetitions    PATIENT EDUCATION:  Education details:see treatment Person educated: Patient Education method: Explanation, demo, handout Education comprehension: verbalized understanding,returned demo   HOME EXERCISE PROGRAM: Access Code: [email or web address] URL: [email or web address] Date: 08/04/2024 Prepared by: Lucie Meeter  Exercises - Long Sitting Calf Stretch with Strap  - 1 x daily - 7 x weekly - 3 sets - 30s ec  hold - Seated Plantar Fascia Mobilization with Small Ball  - 1 x daily - 7 x weekly - 3-5 minute  hold - Seated Great Toe Extension  - 1 x daily - 7 x weekly - 2 sets - 10 reps - Long Sitting Ankle Plantar Flexion with Resistance  - 1 x daily - 7 x weekly - 2 sets - 10 reps - Long Sitting Ankle Inversion with Resistance  - 1 x daily - 7 x weekly - 2 sets - 10 reps - Toe Raises with Counter Support  - 1 x daily - 7 x weekly - 2 sets - 10 reps - Heel Raises with Counter Support  - 1 x daily - 7 x weekly - 2 sets - 10 reps - Towel Scrunches  - 1 x daily - 7 x weekly - 1 sets - 5 reps - Rolling rightleft sides for vestibular habituation  - 1-2 x  daily - 7 x weekly - 1 sets - 3-5 reps - Seated Right Head Turns Vestibular Habituation  - 1-2 x daily - 7 x  weekly - 1 sets - 5 reps - Standing Hip Abduction with Counter Support  - 1 x daily - 7 x weekly - 2 sets - 10 reps - Standing Romberg to 3/4 Tandem Stance  - 1 x daily - 7 x weekly - 3 sets - max  hold  ASSESSMENT:  CLINICAL IMPRESSION: Aralynn is making steady progress in PT s/p Lt ankle arthroscopy Brostrom stabilization, peroneus brevis and longus debridement and tenosynovectomy on 03/12/24 and more recent treatment for dizziness. She demonstrates overall improvements in ankle ROM and strength having met these associated functional goals. Her biggest deficits remain balance impairments and ongoing dizziness. She arrives with baseline dizziness as a 4/10 today, but is better tolerating VOR training in clinic having met this LTG. She will benefit from continued skilled PT to further progress her LE strength, balance and continue with habituation training in order to optimize her function.   EVAL: Patient is a 78 y.o. female who was seen today for physical therapy evaluation and treatment for s/p Lt ankle arthroscopy Brostrom stabilization, peroneus brevis and longus debridement and tenosynovectomy on 03/12/24. She demonstrates ROM, strength, gait and balance deficits that are consistent with her recent post-operative status. She will benefit from skilled PT to address the above stated deficits in order to return to optimal function.   OBJECTIVE IMPAIRMENTS: Abnormal gait, decreased activity tolerance, decreased balance, decreased endurance, decreased knowledge of condition, decreased mobility, difficulty walking, decreased ROM, decreased strength, increased edema, impaired flexibility, impaired sensation, improper body mechanics, postural dysfunction, and pain.   GOALS: Goals reviewed with patient? Yes  SHORT TERM GOALS: Target date: 06/09/2024  Patient will be independent and compliant with  initial HEP.  Baseline: issued at eval  Goal status: MET  2.  Patient will demonstrate at least 60 degrees of Lt ankle plantarflexion AROM to improve push-off during gait cycle.  Baseline: see above Goal status: MET  3.  Patient will demonstrate at least 10 degrees of Lt ankle dorsiflexion AROM to improve gait mechanics.  Baseline: see above Goal status: MET  4.  Patient will complete TUG in </= 12 seconds to reduce fall risk.  Baseline: see above Goal status: MET  LONG TERM GOALS: Target date: 09/20/24  Patient will score >/= 60/104 on the Foot and Ankle Disability Index (MDC 7) to signify clinically meaningful improvement in functional abilities.  Baseline: see above Goal status: MET  2.  Patient will maintain tandem stance for at least 10 seconds to improve gait stability.  Baseline: see above 06/26/24: tandem 1 second 08/04/24: tandem 4 second Goal status: progressing   3.  Patient will be modified independent with curb/stair negotiation.  Baseline: uses cane and requires support from spouse  06/26/24: requires cane  Goal status: MET  4.  Patient will self-report ability to tolerate at least 30 minutes of standing/walking activity.  Baseline: 10 minutes  06/26/24: 20 minutes  08/04/24: limited due to back pain and dizziness, 10 minutes  Goal status: progressing   5.  Patient will demonstrate at least 4/5 Lt ankle strength to improve stability when navigating on uneven terrain.  Baseline: see above  Goal status: MET  6.  Patient will be independent with advanced home program to progress/maintain current level of function.  Baseline: initial HEP issued  Goal status: progressing    7. Patient will tolerate rolling R and L without d/o dizziness.   Baseline: 4/10 dizzines 08/04/24: 4/10 dizziness baseline    Goal status: deferred due to 4/10 baseline dizzines  8. Patient will tolerate gaze x 1 viewing in sitting x 20 seconds to work towards improved tolerance to turns in  standing.   Baseline: Unable to do 2 reps in sitting   08/04/24: VOR x 1, 20 seconds    Goal status: MET  PLAN:  PT FREQUENCY: 2x/week  PT DURATION: 6 weeks  PLANNED INTERVENTIONS: 97164- PT Re-evaluation, 97750- Physical Performance Testing, 97110-Therapeutic exercises, 97530- Therapeutic activity, [id number]- Neuromuscular re-education, 97535- Self Care, [id number]- Manual therapy, [id number]- Gait training, [id number]- Aquatic Therapy, [id number]- Vasopneumatic device, Taping, Dry Needling, Cryotherapy, and Moist heat  PLAN FOR NEXT SESSION: review and progress HEP; ankle strengthening; static balance. For vestibular: Progress habituation (seated head turns>standing head turns), add brandt daroff habituation, work on gaze as tolerated in clinic with cues,  Consider BBQ roll for canolith repositioning to R if symptoms remain after habituation (responded well to habituation in clinic and may not need). Convergence-- give for HEP (only provoking mild sxs);   Aundrey Elahi, PT, DPT, ATC 08/04/24 4:32 PM

## 2024-08-06 ENCOUNTER — Ambulatory Visit: Payer: Self-pay

## 2024-08-06 DIAGNOSIS — M6281 Muscle weakness (generalized): Secondary | ICD-10-CM

## 2024-08-06 DIAGNOSIS — R2681 Unsteadiness on feet: Secondary | ICD-10-CM

## 2024-08-06 DIAGNOSIS — R42 Dizziness and giddiness: Secondary | ICD-10-CM

## 2024-08-06 DIAGNOSIS — R2689 Other abnormalities of gait and mobility: Secondary | ICD-10-CM

## 2024-08-06 DIAGNOSIS — M25572 Pain in left ankle and joints of left foot: Secondary | ICD-10-CM

## 2024-08-06 NOTE — Therapy (Signed)
 OUTPATIENT PHYSICAL THERAPY TREATMENT    Patient Name: [name] MRN: [id number] DOB:1946-10-23, 78 y.o., female Today's Date: 08/06/2024  END OF SESSION:  PT End of Session - 08/06/24 1010     Visit Number 17    Number of Visits 28    Date for PT Re-Evaluation 09/20/24    Authorization Type [employer]    Progress Note Due on Visit 20    PT Start Time 1013    PT Stop Time 1057    PT Time Calculation (min) 44 min    Activity Tolerance Patient tolerated treatment well    Behavior During Therapy [facility] for tasks assessed/performed              Past Medical History:  Diagnosis Date   Abdominal pain 10/26/2013   Allergic rhinitis 01/25/2016   ANA positive 07/29/2013   Anemia    Arthralgia 08/18/2013   Arthritis    Asthma, allergic 08/05/2012   Atrial flutter    past history- not current   Atypical chest pain 01/01/2015   Bilateral carpal tunnel syndrome 03/04/2015   CAD in native artery 08/25/2010   Minor CAD 2009, 2011, low risk Myoview  2013 and 2016        Cataract    bilaterally removed    Cellulitis of breast 02/05/2023   Chronic constipation    Chronic cough 01/09/2017   Chronic interstitial cystitis 02/01/2017   Chronic night sweats 01/08/2015   Common bile duct dilatation 02/13/2023   Concussion    x 3   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   DDD (degenerative disc disease), cervical 09/17/2018   Degenerative scoliosis 04/22/2018   Deviated septum 05/12/2016   Dysphagia    Dyspnea    Dysuria 03/09/2021   Elevated alkaline phosphatase level 01/25/2023   Essential hypertension 08/25/2010   External hemorrhoids 02/05/2015   Facial trauma 01/22/2023   Family history of adverse reaction to anesthesia    Family history of malignant hyperthermia    father had this   Fibrocystic breast disease 10/18/2010   Fibromyalgia    Frequency of urination    Generalized anxiety disorder 08/03/2010   GERD (gastroesophageal reflux disease)    Headache  10/13/2013   High serum vitamin D  05/18/2021   History of chronic bronchitis    History of colonic polyp 02/25/2019   History of hiatal hernia    History of tachycardia    CONTROLLED  WITH ATENOLOL    Hyperglycemia 03/08/2021   Hyperkalemia 09/28/2022   Hyperlipidemia    Intertrigo 12/13/2022   Kidney stone 02/26/2019   Knee pain, right 04/30/2015   Left hip pain 04/30/2015   Leg pain 02/24/2011   Qualifier: Diagnosis of   By: Antonio DOJamee         Leukocytosis 07/05/2022   Local reaction to hymenoptera sting 05/09/2023   Low back pain 09/08/2020   Major depressive disorder 02/05/2013   Malignant neoplasm of lower-outer quadrant of right breast of female, estrogen receptor positive 01/03/2013   Nasal turbinate hypertrophy 05/12/2016   Oral herpes 02/05/2023   OSA and COPD overlap syndrome 06/10/2020   Osteoporosis 01/2019   T score -2.2 stable/improved from prior study   Pelvic pain    Peripheral neuropathy 05/22/2014   Personal history of radiation therapy    Pneumonia    Primary insomnia 06/25/2017   Pulmonary nodules 02/12/2015   Rash and other nonspecific skin eruption 05/09/2023   Recurrent falls 05/02/2020   Recurrent sinusitis 02/25/2019  Right arm pain 07/20/2016   RLS (restless legs syndrome) 11/09/2019   RUQ pain 07/05/2022   S/P radiation therapy 11/12/12 - 12/05/12   right Breast   Sciatica 04/30/2015   Sepsis 2014   from UTI    Sjogren's disease 12/12/2021   Splenic lesion 09/02/2012   Sprain of lateral ligament of ankle joint 07/31/2023   Stage 3 chronic kidney disease    Statin intolerance 02/29/2020   Stricture and stenosis of esophagus 10/19/2011   Tongue lesion 02/05/2023   Tremor 07/05/2022   Urgency of urination    Vertigo 01/22/2023   Vitamin B12 deficiency 03/08/2021   Vocal cord cyst 05/18/2021   Past Surgical History:  Procedure Laterality Date   24 HOUR PH STUDY N/A 04/03/2016   Procedure: 24 HOUR PH STUDY;  Surgeon: Elspeth Deward Naval, MD;  Location: [facility];  Service: Gastroenterology;  Laterality: N/A;   ANKLE ARTHROSCOPY WITH RECONSTRUCTION Left 03/12/2024   Procedure: ANKLE ARTHROSCOPY WITH OPEN LATERAL ANKLE LIGAMENT STABILIZATION;  Surgeon: Barton Drape, MD;  Location: [facility];  Service: Orthopedics;  Laterality: Left;   BREAST BIOPSY Right 08/23/2012   ADH   BREAST BIOPSY Right 10/11/2012   Ductal Carcinoma   BREAST EXCISIONAL BIOPSY Right 09/04/2013   benign   BREAST LUMPECTOMY Right 10/11/2012   W/ SLN BX   CARDIAC CATHETERIZATION  09-13-2007  DR MORRIS   WELL-PRESERVED LVF/  DIFFUSE SCATTERED CORONARY CALCIFACATION AND ATHEROSCLEROSIS WITHOUT OBSTRUCTION   CARDIAC CATHETERIZATION  08-04-2010  DR MCALHANY   NON-OBSTRUCTIVE CAD/  pLAD 40%/  oLAD 30%/  mLAD 30%/  pRCA 30%/  EF 60%   CARDIOVASCULAR STRESS TEST  06-18-2012  DR McALHANY   LOW RISK NUCLEAR STUDY/  SMALL FIXED AREA OF MODERATELY DECREASED UPTAKE IN ANTEROSEPTAL WALL WHICH MAY BE ARTIFACTUAL/  NO ISCHEMIA/  EF 68%   COLONOSCOPY  09/29/2010   CYSTOSCOPY     CYSTOSCOPY WITH HYDRODISTENSION AND BIOPSY N/A 03/06/2014   Procedure: CYSTOSCOPY/HYDRODISTENSION/ INSTILATION OF MARCAINE  AND PYRIDIUM ;  Surgeon: Arlena LILLETTE Gal, MD;  Location: [facility];  Service: Urology;  Laterality: N/A;   CYSTOSCOPY/URETEROSCOPY/HOLMIUM LASER/STENT PLACEMENT Left 03/21/2023   Procedure: CYSTOSCOPY LEFT RETROGRADE PYELOGRAM, LEFT URETEROSCOPY, HOLMIUM LASER LITHOTRIPSYM, AND LEFT URETERAL STENT PLACEMENT;  Surgeon: Devere Lonni Righter, MD;  Location: [facility];  Service: Urology;  Laterality: Left;  60 MINUTES   ESOPHAGEAL MANOMETRY N/A 04/03/2016   Procedure: ESOPHAGEAL MANOMETRY (EM);  Surgeon: Elspeth Deward Naval, MD;  Location: [facility];  Service: Gastroenterology;  Laterality: N/A;   EXTRACORPOREAL SHOCK WAVE LITHOTRIPSY Left 02/06/2019   Procedure: EXTRACORPOREAL SHOCK WAVE LITHOTRIPSY (ESWL);   Surgeon: Ottelin, Mark, MD;  Location: [facility];  Service: Urology;  Laterality: Left;   NASAL SINUS SURGERY  1985   ORIF RIGHT ANKLE  FX  2006   POLYPECTOMY     REMOVAL VOCAL CORD CYST  01/2013   REPAIR OF PERONEUS BREVIS TENDON Left 03/12/2024   Procedure: REPAIR OF PERONEUS TENDON;  Surgeon: Barton Drape, MD;  Location: [facility];  Service: Orthopedics;  Laterality: Left;   RIGHT BREAST BX  08/23/2012   RIGHT HAND SURGERY  X3  LAST ONE 2009   INCLUDES  ORIF RIGHT 5TH FINGER AND REVISION TWICE   SKIN CANCER EXCISION     face,arms   TONSILLECTOMY AND ADENOIDECTOMY  AGE 6   TOTAL ABDOMINAL HYSTERECTOMY W/ BILATERAL SALPINGOOPHORECTOMY  1982   W/  APPENDECTOMY   TRANSTHORACIC ECHOCARDIOGRAM  06/24/2012   GRADE I  DIASTOLIC DYSFUNCTION/  EF 55-60%/  MILD MR   UPPER GASTROINTESTINAL ENDOSCOPY     Patient Active Problem List   Diagnosis Date Noted   Pedal edema 05/20/2024   Otalgia of both ears 04/23/2024   Tinnitus of both ears 04/23/2024   Hypokalemia 12/10/2023   IDA (iron deficiency anemia) 09/12/2023   Stage 3 chronic kidney disease    Right ankle pain 07/31/2023   Common bile duct dilatation 02/13/2023   Oral herpes 02/05/2023   Elevated alkaline phosphatase level 01/25/2023   Vertigo 01/22/2023   Intertrigo 12/13/2022   Hyperkalemia 09/28/2022   Anemia 09/28/2022   Tremor 07/05/2022   RUQ pain 07/05/2022   Leukocytosis 07/05/2022   Sjogren's disease 12/12/2021   Vocal cord cyst 05/18/2021   Vitamin D  deficiency 05/18/2021   Dysuria 03/09/2021   Vitamin B12 deficiency 03/08/2021   Hyperglycemia 03/08/2021   OSA and COPD overlap syndrome 06/10/2020   Recurrent falls 05/02/2020   Statin intolerance 02/29/2020   RLS (restless legs syndrome) 11/09/2019   Recurrent sinusitis 02/25/2019   History of colonic polyp 02/25/2019   DDD (degenerative disc disease), cervical 09/17/2018   Degenerative scoliosis 04/22/2018   Primary insomnia 06/25/2017    Chronic interstitial cystitis 02/01/2017   Deviated nasal septum 05/12/2016   Hypertrophy of nasal turbinates 05/12/2016   Dysphagia    Allergic rhinitis 01/25/2016   Dyspnea    Sciatica 04/30/2015   Bilateral carpal tunnel syndrome 03/04/2015   Hyperlipidemia 03/01/2015   Pulmonary nodules 02/12/2015   External hemorrhoids 02/05/2015   Chronic night sweats 01/08/2015   Peripheral neuropathy 05/22/2014   Headache 10/13/2013   Arthralgia 08/18/2013   ANA positive 07/29/2013   Depression with anxiety 02/05/2013   Malignant neoplasm of lower-outer quadrant of right breast of female, estrogen receptor positive 01/03/2013   Asthma, allergic 08/05/2012   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   GERD (gastroesophageal reflux disease) 06/03/2012   Stricture and stenosis of esophagus 10/19/2011   Osteoporosis 04/17/2011   Fibrocystic breast disease 10/18/2010   Essential hypertension 08/25/2010   CAD in native artery 08/25/2010   Generalized anxiety disorder 08/03/2010   Bronchitis 03/10/2010   Fibromyalgia 01/11/2010   PCP: Domenica Harlene LABOR, MD  REFERRING PROVIDER: Barton Drape, MD  REFERRING DIAG: [phone] (ICD-10-CM) - Peroneal tendinitis, left leg  THERAPY DIAG:  Muscle weakness (generalized)  Other abnormalities of gait and mobility  Unsteadiness on feet  Pain in left ankle and joints of left foot  Dizziness and giddiness  Rationale for Evaluation and Treatment: Rehabilitation  ONSET DATE: 03/12/24  SUBJECTIVE:  SUBJECTIVE STATEMENT: Patient reports she was sore in the calf after last session starting that evening and it is still sore. Both feet were hurting after last session along the midfoot and arch. Still is awaiting referral for her back pain. Patient reports her dizziness is better today.   PERTINENT HISTORY: S/p Lt ankle arthroscopy Brostrom stabilization, peroneus brevis and longus debridement and tenosynovectomy 03/12/24 See extensive PMH above   PAIN:  Are  you having pain? Yes: NPRS scale: 4-5/10 Pain location: bilateral feet Pain description: hot,pulling Aggravating factors: walking Relieving factors: rest  PRECAUTIONS: Fall,progress per protocol   WEIGHT BEARING RESTRICTIONS: No  FALLS:  Has patient fallen in last 6 months? Yes. Number of falls 2 (fell stepping up grassy hill, fell in her kitchen from turning quickly)  LIVING ENVIRONMENT: Lives with: lives with their spouse Lives in: House/apartment Stairs: Yes: Internal: 16 steps; on left going up Has following equipment at home:  Single point cane  PATIENT GOALS: I want to be able to walk again.  NEXT MD VISIT: November 2025   OBJECTIVE:  Note: Objective measures were completed at Evaluation unless otherwise noted.  DIAGNOSTIC FINDINGS: none on file   PATIENT SURVEYS:  FADI: 46/104; 44% 06/26/24: FADI: 67/100; 67%  COGNITION: Overall cognitive status: Within functional limits for tasks assessed     SENSATION: See palpation  EDEMA:  Mild swelling about Lt ankle   PALPATION: Hypersensitivity to palpation about distal fibula, lateral malleolus   LOWER EXTREMITY ROM: Active ROM Right eval Left eval 05/05/24 Left  05/20/24 Left  06/03/24 Left   Hip flexion       Hip extension       Hip abduction       Hip adduction       Hip internal rotation       Hip external rotation       Knee flexion       Knee extension       Ankle dorsiflexion  6 9  10   Ankle plantarflexion  50  65 67  Ankle inversion  15  15 17   Ankle eversion  8  8 9    (Blank rows = not tested)  LOWER EXTREMITY MMT: MMT Right eval Left eval 06/26/24 Left  08/04/24 Left   Hip flexion      Hip extension      Hip abduction      Hip adduction      Hip internal rotation      Hip external rotation      Knee flexion      Knee extension      Ankle dorsiflexion  3+ 4+ 4+  Ankle plantarflexion  3+ sitting 4- in sitting 4 in sitting   Ankle inversion  3+ 4- 4  Ankle eversion  3+ 4 5   (Blank  rows = not tested)  FUNCTIONAL TESTS:  TUG: 13.5 seconds Romberg x 30 seconds Semi-tandem unable   06/09/24: TUG: 11.1 seconds   06/26/24: tandem 1 second   GAIT: Distance walked: 20 ft  Assistive device utilized: Single point cane. Ankle brace Level of assistance: Modified independence Comments: limited push-off LLE, foot ER,  [facility] Adult PT Treatment:                                                DATE: 08/06/24 Therapeutic Exercise: Calf stretch on wedge x 30 sec  Gastroc stretch at wall x 30 sec each Reviewed and updated HEP   Neuromuscular re-ed: Romberg EC  2 x 20 sec, 1 x 30 sec  Romberg with horizontal head turns x 10  VOR x 1 horizontal/vertical x 30 sec, normal stance  VOR x 1 horizontal 2 x 10 second, romberg dizzy 6/10  Standing ball toss to self x 10     [facility] Adult PT Treatment:                                                DATE: 08/04/24 Therapeutic Exercise: Eccentric calf raise 2 x 10  Reviewed and updated HEP  Neuromuscular re-ed: Semi-tandem 2 x 30 sec each Romberg x 30 sec Romberg EC x 3 seconds Normal stance  EC x 30 seconds Narrow stance EC x 19 seconds  Therapeutic Activity: Re-assessment to determine overall progress, educating patient on progress towards goals   Self Care: Recommended seeking care for what patient's believe is a sinus headache    [facility] Adult PT Treatment:                                                DATE: 07/24/24 BP: 112/74 HR: 70    Self Care: Educated patient on blood pressure findings, recommended to monitor vitals at home Continue with fluids and eating as tolerated  F/u with PCP if symptoms worsen or do not improve    [facility] Adult PT Treatment:                                                DATE: 07/10/24 Therapeutic Exercise: Calf raise on leg press 2 x 10 @ 40 lbs; DL  LAQ 2 x 10 @ 2 lbs  Seated HS curl 2 x 10 red band   Therapeutic Activity: Step up knee driver 6 inch step; single UE support x 10  Outdoor  walking with SPC on inclined surfaces and curb negotiation. Working on Youth worker on uneven/inclined surfaces.     PATIENT EDUCATION:  Education details:see treatment Person educated: Patient Education method: Explanation, demo, handout Education comprehension: verbalized understanding,returned demo   HOME EXERCISE PROGRAM: Access Code: [email or web address] URL: [email or web address] Date: 08/06/2024 Prepared by: Lucie Meeter  Exercises - Seated Plantar Fascia Mobilization with Small Ball  - 1 x daily - 7 x weekly - 3-5 minute  hold - Seated Great Toe Extension  - 1 x daily - 7 x weekly - 2 sets - 10 reps - Long Sitting Ankle Plantar Flexion with Resistance  - 1 x daily - 7 x weekly - 2 sets - 10 reps - Long Sitting Ankle Inversion with Resistance  - 1 x daily - 7 x weekly - 2 sets - 10 reps - Toe Raises with Counter Support  - 1 x daily - 7 x weekly - 2 sets - 10 reps - Heel Raises with Counter Support  - 1 x daily - 7 x weekly - 2 sets - 10 reps - Towel Scrunches  - 1 x daily - 7 x weekly - 1 sets - 5 reps - Rolling rightleft sides for vestibular habituation  - 1-2 x daily - 7 x weekly - 1 sets - 10 reps - Seated Right Head Turns Vestibular Habituation  - 1-2 x daily - 7 x weekly - 1 sets - 5 reps - Standing Gaze Stabilization with Head Rotation  - 1 x daily - 7 x weekly - 2 sets - 10 reps - Standing Hip Abduction with Counter Support  - 1 x daily - 7 x weekly - 2 sets - 10 reps - Standing Romberg to 3/4 Tandem Stance  - 1 x daily - 7 x weekly - 3 sets - max  hold - Gastroc Stretch on Wall  - 1 x daily - 7 x weekly - 3 sets - 30 sec  hold  ASSESSMENT:  CLINICAL IMPRESSION: Patient with immediate reduction in foot/calf pain following gastroc stretch. We focused on progression  of dynamic balance activity and VOR training with good tolerance. She demonstrates moderate sway with romberg stance with eyes closed, but is able to maintain balance  independently for 20-30 second bouts. No dizziness with vertical and horizontal VOR x 1 in normal stance, but with romberg dizziness is quickly provoked with horizontal VOR x 1 rated as a 6/10. Dizziness does quickly subside with rest break. No increase in foot pain noted with standing balance activity.   EVAL: Patient is a 78 y.o. female who was seen today for physical therapy evaluation and treatment for s/p Lt ankle arthroscopy Brostrom stabilization, peroneus brevis and longus debridement and tenosynovectomy on 03/12/24. She demonstrates ROM, strength, gait and balance deficits that are consistent with her recent post-operative status. She will benefit from skilled PT to address the above stated deficits in order to return to optimal function.   OBJECTIVE IMPAIRMENTS: Abnormal gait, decreased activity tolerance, decreased balance, decreased endurance, decreased knowledge of condition, decreased mobility, difficulty walking, decreased ROM, decreased strength, increased edema, impaired flexibility, impaired sensation, improper body mechanics, postural dysfunction, and pain.   GOALS: Goals reviewed with patient? Yes  SHORT TERM GOALS: Target date: 06/09/2024  Patient will be independent and compliant with initial HEP.  Baseline: issued at eval  Goal status: MET  2.  Patient will demonstrate at least 60 degrees of Lt ankle plantarflexion AROM to improve push-off during gait cycle.  Baseline: see above Goal status: MET  3.  Patient will demonstrate at least 10 degrees of Lt ankle dorsiflexion AROM to improve gait mechanics.  Baseline: see above Goal status: MET  4.  Patient will complete TUG in </= 12 seconds to reduce fall risk.  Baseline: see above Goal status: MET  LONG TERM GOALS: Target date: 09/20/24  Patient will score >/= 60/104 on the Foot and Ankle Disability Index (MDC 7) to signify clinically meaningful improvement in functional abilities.  Baseline: see above Goal status:  MET  2.  Patient will maintain tandem stance for at least 10 seconds to improve gait stability.  Baseline: see above 06/26/24: tandem 1 second 08/04/24: tandem 4 second Goal status: progressing   3.  Patient will be modified independent with curb/stair negotiation.  Baseline: uses cane and requires support from spouse  06/26/24: requires cane  Goal status: MET  4.  Patient will self-report ability to tolerate at least 30 minutes of standing/walking activity.  Baseline: 10 minutes  06/26/24: 20 minutes  08/04/24: limited due to back pain and dizziness, 10 minutes  Goal status: progressing   5.  Patient will demonstrate at least 4/5 Lt ankle strength to improve stability when navigating on uneven terrain.  Baseline: see above  Goal status: MET  6.  Patient will be independent with advanced home program to progress/maintain current level of function.  Baseline: initial HEP issued  Goal status: progressing    7. Patient will tolerate rolling R and L without d/o dizziness.   Baseline: 4/10 dizzines 08/04/24: 4/10 dizziness baseline    Goal status: deferred due to 4/10 baseline dizzines     8. Patient will tolerate gaze x 1 viewing in sitting x 20 seconds to work towards improved tolerance to turns in standing.   Baseline: Unable to do 2 reps in sitting   08/04/24: VOR x 1, 20 seconds    Goal status: MET  PLAN:  PT FREQUENCY: 2x/week  PT DURATION: 6 weeks  PLANNED INTERVENTIONS: 97164- PT Re-evaluation, 97750- Physical Performance Testing, 97110-Therapeutic exercises, 97530- Therapeutic activity, [id number]- Neuromuscular  re-education, [phone]- Self Care, 02859- Manual therapy, [id number]- Gait training, [id number]- Aquatic Therapy, 97016- Vasopneumatic device, Taping, Dry Needling, Cryotherapy, and Moist heat  PLAN FOR NEXT SESSION: review and progress HEP; ankle strengthening; static balance. For vestibular: Progress habituation (seated head turns>standing head turns), add brandt daroff habituation,  work on gaze as tolerated in clinic with cues,  Consider BBQ roll for canolith repositioning to R if symptoms remain after habituation (responded well to habituation in clinic and may not need). Convergence-- give for HEP (only provoking mild sxs);   Charlayne Vultaggio, PT, DPT, ATC 08/06/24 10:59 AM

## 2024-08-10 NOTE — Assessment & Plan Note (Signed)
 Stable on current medications.

## 2024-08-10 NOTE — Assessment & Plan Note (Signed)
 Follows with Dr Keturah of [facility]

## 2024-08-10 NOTE — Assessment & Plan Note (Signed)
 On Repatha  injections. Continue follow-up with cardiology. Encourage heart healthy diet such as MIND or DASH diet, increase exercise, avoid trans fats, simple carbohydrates and processed foods, consider a krill or fish or flaxseed oil cap daily.

## 2024-08-10 NOTE — Assessment & Plan Note (Signed)
 Well controlled, no changes to meds. Encouraged heart healthy diet such as the DASH diet and exercise as tolerated.

## 2024-08-10 NOTE — Assessment & Plan Note (Signed)
 Stable.  Follows with pulmonology.  No recent exacerbations.

## 2024-08-10 NOTE — Assessment & Plan Note (Signed)
 Supplement and monitor

## 2024-08-10 NOTE — Assessment & Plan Note (Signed)
 On aspirin  81 mg daily.  Followed by cardiology

## 2024-08-10 NOTE — Progress Notes (Unsigned)
 Subjective:     Patient ID: [name], female    DOB: [birth year], 78 y.o.   MRN: [id number]  No chief complaint on file.   HPI  [name] is a 78 year old female presents for follow-up chronic conditions  PMHx-anemia, CAD, COPD, depression anxiety, GAD, GERD, IDA, breast cancer, OSA on CPAP, CKD, B12 deficiency, vitamin D  deficiency, history tremor, Sjogren's disease, fibromyalgia, PMR  Patient Care Team: Domenica Harlene LABOR, MD as PCP - General (Family Medicine) Pietro Redell GORMAN, MD as PCP - Cardiology (Cardiology) Ivin Kocher, MD as Consulting Physician (Dermatology) Sheri Lamar GORMAN, MD as Consulting Physician (Pulmonary Disease) Pietro Redell GORMAN, MD as Consulting Physician (Cardiology) Armbruster, Elspeth SQUIBB, MD as Consulting Physician (Gastroenterology) Bonner Ade, MD as Consulting Physician (Physical Medicine and Rehabilitation) Fontaine, Evalene SQUIBB, MD (Inactive) as Consulting Physician (Gynecology) Mable Lenis, MD (Inactive) as Consulting Physician (Otolaryngology) Ernie Cough, MD as Consulting Physician (Orthopedic Surgery) Ottelin, Mark, MD (Inactive) as Consulting Physician (Urology) Leni Marjory MATSU, MD (Rheumatology)    Nephrology???**** GI  HTN Amlodipine  5 mg daily  CAD-followed by cardiology, Dr. Pietro Aspirin  81 mg daily  HLD On Repatha  inj-followed by cardiology Patient is statin intolerant  Chronic dyspnea Per cardiology note July 2025-history of atypical chest pain symptoms with no EKG or diagnostic changes, recent echo showed normal LV function  Chronic low back pain , lumbar radiculopathy-followed by orthopedics Patient has been doing PT- Gabapentin  300 mg tab-takes 600mg  a.m., 600mg  lunch, 900 mg bedtime  History IDA- Folic acid  1 mg daily Recently had endoscopy/colonoscopy evaluation by GI July 2025-no concerning findings  Anxiety- Duloxetine  (Cymbalta ) 60 mg twice daily hydroxyzine  10 mg tablet takes 0.5 to 2  tablets by mouth 3 times daily as needed for anxiety  GERD-Protonix  40 mg daily  Insomnia-trazodone  100 mg at bedtime  Vitamin D  deficiency-2000 units vitamin D  daily-- how much taking now???  Last vitamin D  Lab Results  Component Value Date   VD25OH 29.99 (L) 07/23/2024    Vitamin B12 deficiency-1000 mcg B12 daily  Chronic gastric issues-followed by GI Linzess  145 mcg daily; Voquezna ???  HCM Colonoscopy June 2025 showed 3 polyps which were removed and hemorrhoid   Patient denies fever, chills, SOB, CP, palpitations, dyspnea, edema, HA, vision changes, N/V/D, abdominal pain, urinary symptoms, rash, weight changes, and recent illness or hospitalizations.   History of Present Illness              There are no preventive care reminders to display for this patient.  Past Medical History:  Diagnosis Date   Abdominal pain 10/26/2013   Allergic rhinitis 01/25/2016   ANA positive 07/29/2013   Anemia    Arthralgia 08/18/2013   Arthritis    Asthma, allergic 08/05/2012   Atrial flutter    past history- not current   Atypical chest pain 01/01/2015   Bilateral carpal tunnel syndrome 03/04/2015   CAD in native artery 08/25/2010   Minor CAD 2009, 2011, low risk Myoview  2013 and 2016        Cataract    bilaterally removed    Cellulitis of breast 02/05/2023   Chronic constipation    Chronic cough 01/09/2017   Chronic interstitial cystitis 02/01/2017   Chronic night sweats 01/08/2015   Common bile duct dilatation 02/13/2023   Concussion    x 3   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   DDD (degenerative disc disease), cervical 09/17/2018   Degenerative scoliosis 04/22/2018   Deviated septum 05/12/2016   Dysphagia  Dyspnea    Dysuria 03/09/2021   Elevated alkaline phosphatase level 01/25/2023   Essential hypertension 08/25/2010   External hemorrhoids 02/05/2015   Facial trauma 01/22/2023   Family history of adverse reaction to anesthesia    Family history of malignant  hyperthermia    father had this   Fibrocystic breast disease 10/18/2010   Fibromyalgia    Frequency of urination    Generalized anxiety disorder 08/03/2010   GERD (gastroesophageal reflux disease)    Headache 10/13/2013   High serum vitamin D  05/18/2021   History of chronic bronchitis    History of colonic polyp 02/25/2019   History of hiatal hernia    History of tachycardia    CONTROLLED  WITH ATENOLOL    Hyperglycemia 03/08/2021   Hyperkalemia 09/28/2022   Hyperlipidemia    Intertrigo 12/13/2022   Kidney stone 02/26/2019   Knee pain, right 04/30/2015   Left hip pain 04/30/2015   Leg pain 02/24/2011   Qualifier: Diagnosis of   By: Antonio DOJamee         Leukocytosis 07/05/2022   Local reaction to hymenoptera sting 05/09/2023   Low back pain 09/08/2020   Major depressive disorder 02/05/2013   Malignant neoplasm of lower-outer quadrant of right breast of female, estrogen receptor positive 01/03/2013   Nasal turbinate hypertrophy 05/12/2016   Oral herpes 02/05/2023   OSA and COPD overlap syndrome 06/10/2020   Osteoporosis 01/2019   T score -2.2 stable/improved from prior study   Pelvic pain    Peripheral neuropathy 05/22/2014   Personal history of radiation therapy    Pneumonia    Primary insomnia 06/25/2017   Pulmonary nodules 02/12/2015   Rash and other nonspecific skin eruption 05/09/2023   Recurrent falls 05/02/2020   Recurrent sinusitis 02/25/2019   Right arm pain 07/20/2016   RLS (restless legs syndrome) 11/09/2019   RUQ pain 07/05/2022   S/P radiation therapy 11/12/12 - 12/05/12   right Breast   Sciatica 04/30/2015   Sepsis 2014   from UTI    Sjogren's disease 12/12/2021   Splenic lesion 09/02/2012   Sprain of lateral ligament of ankle joint 07/31/2023   Stage 3 chronic kidney disease    Statin intolerance 02/29/2020   Stricture and stenosis of esophagus 10/19/2011   Tongue lesion 02/05/2023   Tremor 07/05/2022   Urgency of urination    Vertigo  01/22/2023   Vitamin B12 deficiency 03/08/2021   Vocal cord cyst 05/18/2021    Past Surgical History:  Procedure Laterality Date   24 HOUR PH STUDY N/A 04/03/2016   Procedure: 24 HOUR PH STUDY;  Surgeon: Elspeth Deward Naval, MD;  Location: [facility];  Service: Gastroenterology;  Laterality: N/A;   ANKLE ARTHROSCOPY WITH RECONSTRUCTION Left 03/12/2024   Procedure: ANKLE ARTHROSCOPY WITH OPEN LATERAL ANKLE LIGAMENT STABILIZATION;  Surgeon: Barton Drape, MD;  Location: [facility];  Service: Orthopedics;  Laterality: Left;   BREAST BIOPSY Right 08/23/2012   ADH   BREAST BIOPSY Right 10/11/2012   Ductal Carcinoma   BREAST EXCISIONAL BIOPSY Right 09/04/2013   benign   BREAST LUMPECTOMY Right 10/11/2012   W/ SLN BX   CARDIAC CATHETERIZATION  09-13-2007  DR MORRIS   WELL-PRESERVED LVF/  DIFFUSE SCATTERED CORONARY CALCIFACATION AND ATHEROSCLEROSIS WITHOUT OBSTRUCTION   CARDIAC CATHETERIZATION  08-04-2010  DR MCALHANY   NON-OBSTRUCTIVE CAD/  pLAD 40%/  oLAD 30%/  mLAD 30%/  pRCA 30%/  EF 60%   CARDIOVASCULAR STRESS TEST  06-18-2012  DR McALHANY   LOW RISK  NUCLEAR STUDY/  SMALL FIXED AREA OF MODERATELY DECREASED UPTAKE IN ANTEROSEPTAL WALL WHICH MAY BE ARTIFACTUAL/  NO ISCHEMIA/  EF 68%   COLONOSCOPY  09/29/2010   CYSTOSCOPY     CYSTOSCOPY WITH HYDRODISTENSION AND BIOPSY N/A 03/06/2014   Procedure: CYSTOSCOPY/HYDRODISTENSION/ INSTILATION OF MARCAINE  AND PYRIDIUM ;  Surgeon: Arlena LILLETTE Gal, MD;  Location: [facility];  Service: Urology;  Laterality: N/A;   CYSTOSCOPY/URETEROSCOPY/HOLMIUM LASER/STENT PLACEMENT Left 03/21/2023   Procedure: CYSTOSCOPY LEFT RETROGRADE PYELOGRAM, LEFT URETEROSCOPY, HOLMIUM LASER LITHOTRIPSYM, AND LEFT URETERAL STENT PLACEMENT;  Surgeon: Devere Lonni Righter, MD;  Location: [facility];  Service: Urology;  Laterality: Left;  60 MINUTES   ESOPHAGEAL MANOMETRY N/A 04/03/2016   Procedure: ESOPHAGEAL MANOMETRY (EM);  Surgeon:  Elspeth Deward Naval, MD;  Location: [facility];  Service: Gastroenterology;  Laterality: N/A;   EXTRACORPOREAL SHOCK WAVE LITHOTRIPSY Left 02/06/2019   Procedure: EXTRACORPOREAL SHOCK WAVE LITHOTRIPSY (ESWL);  Surgeon: Ottelin, Mark, MD;  Location: [facility];  Service: Urology;  Laterality: Left;   NASAL SINUS SURGERY  1985   ORIF RIGHT ANKLE  FX  2006   POLYPECTOMY     REMOVAL VOCAL CORD CYST  01/2013   REPAIR OF PERONEUS BREVIS TENDON Left 03/12/2024   Procedure: REPAIR OF PERONEUS TENDON;  Surgeon: Barton Drape, MD;  Location: [facility];  Service: Orthopedics;  Laterality: Left;   RIGHT BREAST BX  08/23/2012   RIGHT HAND SURGERY  X3  LAST ONE 2009   INCLUDES  ORIF RIGHT 5TH FINGER AND REVISION TWICE   SKIN CANCER EXCISION     face,arms   TONSILLECTOMY AND ADENOIDECTOMY  AGE 65   TOTAL ABDOMINAL HYSTERECTOMY W/ BILATERAL SALPINGOOPHORECTOMY  1982   W/  APPENDECTOMY   TRANSTHORACIC ECHOCARDIOGRAM  06/24/2012   GRADE I DIASTOLIC DYSFUNCTION/  EF 55-60%/  MILD MR   UPPER GASTROINTESTINAL ENDOSCOPY      Family History  Problem Relation Age of Onset   Rectal cancer Mother    Colon cancer Mother    Pancreatic cancer Mother    Diabetes Mother    Breast cancer Mother 7   Breast cancer Maternal Aunt        breast   Birth defects Maternal Aunt    Irritable bowel syndrome Son    Heart disease Son        CAD, MV replacement   Allergic Disorder Daughter    Diabetes Daughter    Colon cancer Father    Colon polyps Father    Diabetes Father    Stroke Father    Heart disease Father        CHF   Hyperlipidemia Father    Hypertension Father    Arthritis Father    Breast cancer Paternal Aunt    Breast cancer Paternal Aunt    Arthritis Paternal Uncle    Allergic Disorder Daughter    Heart disease Cousin        CAD, had a blood clot after stenting   Esophageal cancer Neg Hx    Stomach cancer Neg Hx     Social History   Socioeconomic History   Marital  status: Married    Spouse name: Tanda    Number of children: 3   Years of education: 16   Highest education level: Bachelor's degree (e.g., BA, AB, BS)  Occupational History   Occupation: Retired    Comment: Charity fundraiser  Tobacco Use   Smoking status: Former    Current packs/day: 0.00    Average packs/day:  1 pack/day for 15.0 years (15.0 ttl pk-yrs)    Types: Cigarettes    Start date: 05/27/1990    Quit date: 05/27/2005    Years since quitting: 19.2    Passive exposure: Never   Smokeless tobacco: Never  Vaping Use   Vaping status: Never Used  Substance and Sexual Activity   Alcohol use: No    Alcohol/week: 0.0 standard drinks of alcohol   Drug use: No   Sexual activity: Not Currently    Partners: Male    Birth control/protection: Surgical    Comment: 1st intercourse 78 yo-Fewer than 5 partners, hysterectomy  Other Topics Concern   Not on file  Social History Narrative   Lives with husband.   Caffeine use: 1/2 cup per day   Exercise-- 2days a week [employer],  Water  aerobics, walking       Retired Engineer, civil (consulting)   No dietary restrictions, tries to maintain a heart healthy diet   Very pleasant lady   Social Drivers of Corporate investment banker Strain: Low Risk  (06/21/2024)   Overall Financial Resource Strain (CARDIA)    Difficulty of Paying Living Expenses: Not very hard  Food Insecurity: No Food Insecurity (06/21/2024)   Hunger Vital Sign    Worried About Running Out of Food in the Last Year: Never true    Ran Out of Food in the Last Year: Never true  Transportation Needs: No Transportation Needs (06/21/2024)   PRAPARE - Administrator, Civil Service (Medical): No    Lack of Transportation (Non-Medical): No  Physical Activity: Insufficiently Active (06/21/2024)   Exercise Vital Sign    Days of Exercise per Week: 2 days    Minutes of Exercise per Session: 20 min  Stress: No Stress Concern Present (06/21/2024)   [employer] of Occupational Health - Occupational Stress  Questionnaire    Feeling of Stress: Only a little  Social Connections: Socially Integrated (06/21/2024)   Social Connection and Isolation Panel    Frequency of Communication with Friends and Family: More than three times a week    Frequency of Social Gatherings with Friends and Family: Twice a week    Attends Religious Services: More than 4 times per year    Active Member of [employer] or Organizations: Yes    Attends Engineer, structural: More than 4 times per year    Marital Status: Married  Catering manager Violence: Not At Risk (03/04/2024)   Humiliation, Afraid, Rape, and Kick questionnaire    Fear of Current or Ex-Partner: No    Emotionally Abused: No    Physically Abused: No    Sexually Abused: No    Outpatient Medications Prior to Visit  Medication Sig Dispense Refill   acetaminophen  (TYLENOL ) 500 MG tablet Take 1,000 mg by mouth every 6 (six) hours as needed for mild pain or headache.      albuterol  (VENTOLIN  HFA) 108 (90 Base) MCG/ACT inhaler Inhale 2 puffs into the lungs every 4 (four) hours as needed for wheezing or shortness of breath (coughing fits). 18 g 1   amLODipine  (NORVASC ) 5 MG tablet Take 1 tablet by mouth once daily 90 tablet 3   aspirin  EC 81 MG tablet Take 1 tablet (81 mg total) by mouth daily. 90 tablet 3   atenolol  (TENORMIN ) 25 MG tablet Take 1 tablet (25 mg total) by mouth 2 (two) times daily. 180 tablet 1   carboxymethylcellul-glycerin (OPTIVE) 0.5-0.9 % ophthalmic solution Place 1 drop into both eyes daily  as needed for dry eyes.     Cholecalciferol  (VITAMIN D3) 50 MCG (2000 UT) CAPS Take 2,000 Units by mouth daily.     cyanocobalamin  (VITAMIN B12) 1000 MCG tablet Take 1,000 mg by mouth daily with breakfast.     diclofenac Sodium (VOLTAREN) 1 % GEL Apply 2 g topically daily as needed (Back pain).     DULoxetine  (CYMBALTA ) 60 MG capsule Take 1 capsule (60 mg total) by mouth 2 (two) times daily. 180 capsule 1   EPINEPHrine  0.3 mg/0.3 mL IJ SOAJ injection  Inject 0.3 mg into the muscle as needed for anaphylaxis. 2 each 1   fluticasone  (FLONASE ) 50 MCG/ACT nasal spray Place 2 sprays into both nostrils daily. 16 g 5   fluticasone  (FLOVENT  HFA) 220 MCG/ACT inhaler Inhale 1-2 puffs into the lungs in the morning and at bedtime. 1 each 12   folic acid  (FOLVITE ) 1 MG tablet Take 1 tablet (1 mg total) by mouth daily.     furosemide  (LASIX ) 20 MG tablet TAKE 1 TABLET BY MOUTH AS NEEDED FOR EDEMA (Patient taking differently: Take 20 mg by mouth. Once every 3 days) 90 tablet 3   gabapentin  (NEURONTIN ) 300 MG capsule Take 600 mg in the morning 600 mg at lunch time and 900 mg at bedtime 210 capsule 1   hydrOXYzine  (ATARAX ) 10 MG tablet Take 0.5-2 tablets (5-20 mg total) by mouth 3 (three) times daily as needed for anxiety (not with Loratadine ). 90 tablet 2   linaclotide  (LINZESS ) 145 MCG CAPS capsule Take 1 capsule (145 mcg total) by mouth daily before breakfast. 30 capsule 5   loratadine  (CLARITIN ) 10 MG tablet Take 10 mg by mouth daily as needed for rhinitis.     nitroGLYCERIN  (NITROSTAT ) 0.4 MG SL tablet Place 1 tablet (0.4 mg total) under the tongue every 5 (five) minutes x 3 doses as needed for chest pain. 25 tablet 2   nystatin  cream (MYCOSTATIN ) Apply 1 application topically 2 (two) times daily. 30 g 2   pantoprazole  (PROTONIX ) 40 MG tablet Take 1 tablet by mouth 2 (two) times daily.     polyethylene glycol (MIRALAX ) 17 g packet Take 17 g by mouth 2 (two) times daily. 14 each 0   REPATHA  SURECLICK 140 MG/ML SOAJ INJECT 1 SYRINGE SUBCUTANEOUSLY EVERY 14 DAYS 6 mL 0   sucralfate  (CARAFATE ) 1 GM/10ML suspension Take 10 mLs (1 g total) by mouth 4 (four) times daily -  with meals and at bedtime. 420 mL 1   traMADol  (ULTRAM ) 50 MG tablet Take 1 tablet by mouth 3 (three) times daily as needed.     traZODone  (DESYREL ) 100 MG tablet Take 1 tablet (100 mg total) by mouth at bedtime. 90 tablet 0   Vitamin D , Ergocalciferol , (DRISDOL ) 1.25 MG (50000 UNIT) CAPS capsule  Take 1 capsule (50,000 Units total) by mouth every 7 (seven) days. 12x Weeks 5 capsule 3   Vonoprazan Fumarate  (VOQUEZNA ) 10 MG TABS Take 1 capsule by mouth daily. (Patient not taking: Reported on 07/23/2024) 30 tablet 1   No facility-administered medications prior to visit.    Allergies  Allergen Reactions   Clindamycin Hcl Shortness Of Breath and Rash   Levofloxacin  Rash    Other reaction(s): sick, rash hives severe itching   Other Other (See Comments)    Father had malignant hyperthermia   Penicillins Anaphylaxis   Rosuvastatin  Anaphylaxis   Baclofen  Rash   Codeine Hives and Nausea Only   Erythromycin Hives   Haemophilus Influenzae Vaccines Other (See  Comments)    Local reaction at site   Latex Hives   Lincomycin Other (See Comments)   Pentazocine Lactate Other (See Comments)    HALLUCINATION   Pneumococcal Vaccine Polyvalent Hives, Swelling and Other (See Comments)    REACTION: swelling at injection site   Prednisone  Rash    Other reaction(s): Rash, Hives - can tolerate if she takes with benadryl     Tamoxifen  Nausea And Vomiting   Doxycycline  Rash   Fluzone [Influenza Virus Vaccine] Hives    Local reaction at the site   Haemophilus Influenzae Other (See Comments)    Local reaction at the site     ROS See HPI    Objective:    Physical Exam   General: No acute distress. Awake and conversant.  Eyes: Normal conjunctiva, anicteric. Round symmetric pupils.  ENT: Hearing grossly intact. No nasal discharge.  Neck: Neck is supple. No masses or thyromegaly.  Respiratory: CTAB. Respirations are non-labored. No wheezing.  Skin: Warm. No rashes or ulcers.  Psych: Alert and oriented. Cooperative, Appropriate mood and affect, Normal judgment.  CV: RRR. No murmur. No lower extremity edema.  MSK: Normal ambulation. No clubbing or cyanosis.  Neuro:  CN II-XII grossly normal.    There were no vitals taken for this visit. Wt Readings from Last 3 Encounters:  07/23/24 126 lb  (57.2 kg)  06/23/24 128 lb 3.2 oz (58.2 kg)  06/12/24 125 lb (56.7 kg)       Assessment & Plan:   Problem List Items Addressed This Visit     CAD in native artery   On aspirin  81 mg daily.  Followed by cardiology      COPD with asthma (HCC)   Stable.  Follows with pulmonology.  No recent exacerbations.      Depression with anxiety   Stable on current medications.      Essential hypertension - Primary   Well controlled, no changes to meds. Encouraged heart healthy diet such as the DASH diet and exercise as tolerated.        GERD (gastroesophageal reflux disease) (Chronic)   On PPI.  Follows with gastroenterology.      Hyperlipidemia   On Repatha  injections. Continue follow-up with cardiology. Encourage heart healthy diet such as MIND or DASH diet, increase exercise, avoid trans fats, simple carbohydrates and processed foods, consider a krill or fish or flaxseed oil cap daily.        Sjogren's disease   Follows with Dr Keturah of [facility]       Stage 3 chronic kidney disease   Hydrate and monitor      Vitamin B12 deficiency   Supplement monitor      Vitamin D  deficiency   Supplement and monitor       Leg swelling and shortness of breath Chronic dysnpnea. Recent Echo and EKG WNL, - Refer to pulmonology for further evaluation if breathing worsens. - Continue using compression socks and leg elevation for swelling.    Constipation Severe constipation with infrequent bowel movements causing discomfort and bleeding. Current Linzess  dose may be insufficient. - Advise daily use of MiraLAX  and stool softeners. - Add fiber supplement like Benefiber. - Ensure adequate fluid intake. - Use milk of magnesia, prune juice, and Dulcolax suppository every third day. - Consider increasing Linzess  to 290 mcg if current regimen is ineffective. - Follow up with gastroenterology if constipation persists.   Medication Management Requires medication adjustments for albuterol ,  gabapentin , and Tylenol . - Refill albuterol  inhaler prescription. -  Adjust gabapentin  to 2 tablets in the morning, 2 at lunch, and 3 at bedtime. - Prescribe Tylenol  in a more manageable dosage form.   Follow-up Scheduled follow-ups with Harlene and nephrologist. - Ensure contact with pulmonology if respiratory symptoms worsen. - Advise reaching out to gastroenterology if constipation issues persist.    I am having [name] maintain her acetaminophen , aspirin  EC, cyanocobalamin , nystatin  cream, sucralfate , Vitamin D  (Ergocalciferol ), diclofenac Sodium, loratadine , Vitamin D3, OPTIVE, folic acid , EPINEPHrine , polyethylene glycol, furosemide , nitroGLYCERIN , traMADol , hydrOXYzine , DULoxetine , fluticasone , amLODipine , fluticasone , albuterol , atenolol , gabapentin , linaclotide , traZODone , Voquezna , Repatha  SureClick, and pantoprazole .  No orders of the defined types were placed in this encounter.

## 2024-08-10 NOTE — Assessment & Plan Note (Signed)
 Hydrate and monitor

## 2024-08-10 NOTE — Assessment & Plan Note (Signed)
 Supplement monitor.

## 2024-08-10 NOTE — Assessment & Plan Note (Signed)
 On PPI.  Follows with gastroenterology.

## 2024-08-11 ENCOUNTER — Ambulatory Visit

## 2024-08-11 DIAGNOSIS — M25572 Pain in left ankle and joints of left foot: Secondary | ICD-10-CM

## 2024-08-11 DIAGNOSIS — R42 Dizziness and giddiness: Secondary | ICD-10-CM

## 2024-08-11 DIAGNOSIS — R2681 Unsteadiness on feet: Secondary | ICD-10-CM

## 2024-08-11 DIAGNOSIS — M6281 Muscle weakness (generalized): Secondary | ICD-10-CM | POA: Diagnosis not present

## 2024-08-11 DIAGNOSIS — M5416 Radiculopathy, lumbar region: Secondary | ICD-10-CM

## 2024-08-11 DIAGNOSIS — R2689 Other abnormalities of gait and mobility: Secondary | ICD-10-CM

## 2024-08-11 NOTE — Therapy (Signed)
 OUTPATIENT PHYSICAL THERAPY TREATMENT RE-EVALUATION    Patient Name: [name] MRN: [id number] DOB:Oct 27, 1946, 78 y.o., female Today's Date: 08/11/2024  END OF SESSION:  PT End of Session - 08/11/24 1404     Visit Number 18    Number of Visits 28    Date for PT Re-Evaluation 09/20/24    Authorization Type [employer]    Progress Note Due on Visit 20    PT Start Time 1404    PT Stop Time 1445    PT Time Calculation (min) 41 min    Activity Tolerance Patient tolerated treatment well    Behavior During Therapy [facility] for tasks assessed/performed               Past Medical History:  Diagnosis Date   Abdominal pain 10/26/2013   Allergic rhinitis 01/25/2016   ANA positive 07/29/2013   Anemia    Arthralgia 08/18/2013   Arthritis    Asthma, allergic 08/05/2012   Atrial flutter    past history- not current   Atypical chest pain 01/01/2015   Bilateral carpal tunnel syndrome 03/04/2015   CAD in native artery 08/25/2010   Minor CAD 2009, 2011, low risk Myoview  2013 and 2016        Cataract    bilaterally removed    Cellulitis of breast 02/05/2023   Chronic constipation    Chronic cough 01/09/2017   Chronic interstitial cystitis 02/01/2017   Chronic night sweats 01/08/2015   Common bile duct dilatation 02/13/2023   Concussion    x 3   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   DDD (degenerative disc disease), cervical 09/17/2018   Degenerative scoliosis 04/22/2018   Deviated septum 05/12/2016   Dysphagia    Dyspnea    Dysuria 03/09/2021   Elevated alkaline phosphatase level 01/25/2023   Essential hypertension 08/25/2010   External hemorrhoids 02/05/2015   Facial trauma 01/22/2023   Family history of adverse reaction to anesthesia    Family history of malignant hyperthermia    father had this   Fibrocystic breast disease 10/18/2010   Fibromyalgia    Frequency of urination    Generalized anxiety disorder 08/03/2010   GERD (gastroesophageal reflux  disease)    Headache 10/13/2013   High serum vitamin D  05/18/2021   History of chronic bronchitis    History of colonic polyp 02/25/2019   History of hiatal hernia    History of tachycardia    CONTROLLED  WITH ATENOLOL    Hyperglycemia 03/08/2021   Hyperkalemia 09/28/2022   Hyperlipidemia    Intertrigo 12/13/2022   Kidney stone 02/26/2019   Knee pain, right 04/30/2015   Left hip pain 04/30/2015   Leg pain 02/24/2011   Qualifier: Diagnosis of   By: Antonio DOJamee         Leukocytosis 07/05/2022   Local reaction to hymenoptera sting 05/09/2023   Low back pain 09/08/2020   Major depressive disorder 02/05/2013   Malignant neoplasm of lower-outer quadrant of right breast of female, estrogen receptor positive 01/03/2013   Nasal turbinate hypertrophy 05/12/2016   Oral herpes 02/05/2023   OSA and COPD overlap syndrome 06/10/2020   Osteoporosis 01/2019   T score -2.2 stable/improved from prior study   Pelvic pain    Peripheral neuropathy 05/22/2014   Personal history of radiation therapy    Pneumonia    Primary insomnia 06/25/2017   Pulmonary nodules 02/12/2015   Rash and other nonspecific skin eruption 05/09/2023   Recurrent falls 05/02/2020   Recurrent sinusitis 02/25/2019  Right arm pain 07/20/2016   RLS (restless legs syndrome) 11/09/2019   RUQ pain 07/05/2022   S/P radiation therapy 11/12/12 - 12/05/12   right Breast   Sciatica 04/30/2015   Sepsis 2014   from UTI    Sjogren's disease 12/12/2021   Splenic lesion 09/02/2012   Sprain of lateral ligament of ankle joint 07/31/2023   Stage 3 chronic kidney disease    Statin intolerance 02/29/2020   Stricture and stenosis of esophagus 10/19/2011   Tongue lesion 02/05/2023   Tremor 07/05/2022   Urgency of urination    Vertigo 01/22/2023   Vitamin B12 deficiency 03/08/2021   Vocal cord cyst 05/18/2021   Past Surgical History:  Procedure Laterality Date   24 HOUR PH STUDY N/A 04/03/2016   Procedure: 24 HOUR PH STUDY;   Surgeon: Elspeth Deward Naval, MD;  Location: [facility];  Service: Gastroenterology;  Laterality: N/A;   ANKLE ARTHROSCOPY WITH RECONSTRUCTION Left 03/12/2024   Procedure: ANKLE ARTHROSCOPY WITH OPEN LATERAL ANKLE LIGAMENT STABILIZATION;  Surgeon: Barton Drape, MD;  Location: [facility];  Service: Orthopedics;  Laterality: Left;   BREAST BIOPSY Right 08/23/2012   ADH   BREAST BIOPSY Right 10/11/2012   Ductal Carcinoma   BREAST EXCISIONAL BIOPSY Right 09/04/2013   benign   BREAST LUMPECTOMY Right 10/11/2012   W/ SLN BX   CARDIAC CATHETERIZATION  09-13-2007  DR MORRIS   WELL-PRESERVED LVF/  DIFFUSE SCATTERED CORONARY CALCIFACATION AND ATHEROSCLEROSIS WITHOUT OBSTRUCTION   CARDIAC CATHETERIZATION  08-04-2010  DR MCALHANY   NON-OBSTRUCTIVE CAD/  pLAD 40%/  oLAD 30%/  mLAD 30%/  pRCA 30%/  EF 60%   CARDIOVASCULAR STRESS TEST  06-18-2012  DR McALHANY   LOW RISK NUCLEAR STUDY/  SMALL FIXED AREA OF MODERATELY DECREASED UPTAKE IN ANTEROSEPTAL WALL WHICH MAY BE ARTIFACTUAL/  NO ISCHEMIA/  EF 68%   COLONOSCOPY  09/29/2010   CYSTOSCOPY     CYSTOSCOPY WITH HYDRODISTENSION AND BIOPSY N/A 03/06/2014   Procedure: CYSTOSCOPY/HYDRODISTENSION/ INSTILATION OF MARCAINE  AND PYRIDIUM ;  Surgeon: Arlena LILLETTE Gal, MD;  Location: [facility];  Service: Urology;  Laterality: N/A;   CYSTOSCOPY/URETEROSCOPY/HOLMIUM LASER/STENT PLACEMENT Left 03/21/2023   Procedure: CYSTOSCOPY LEFT RETROGRADE PYELOGRAM, LEFT URETEROSCOPY, HOLMIUM LASER LITHOTRIPSYM, AND LEFT URETERAL STENT PLACEMENT;  Surgeon: Devere Lonni Righter, MD;  Location: [facility];  Service: Urology;  Laterality: Left;  60 MINUTES   ESOPHAGEAL MANOMETRY N/A 04/03/2016   Procedure: ESOPHAGEAL MANOMETRY (EM);  Surgeon: Elspeth Deward Naval, MD;  Location: [facility];  Service: Gastroenterology;  Laterality: N/A;   EXTRACORPOREAL SHOCK WAVE LITHOTRIPSY Left 02/06/2019   Procedure: EXTRACORPOREAL SHOCK WAVE  LITHOTRIPSY (ESWL);  Surgeon: Ottelin, Mark, MD;  Location: [facility];  Service: Urology;  Laterality: Left;   NASAL SINUS SURGERY  1985   ORIF RIGHT ANKLE  FX  2006   POLYPECTOMY     REMOVAL VOCAL CORD CYST  01/2013   REPAIR OF PERONEUS BREVIS TENDON Left 03/12/2024   Procedure: REPAIR OF PERONEUS TENDON;  Surgeon: Barton Drape, MD;  Location: [facility];  Service: Orthopedics;  Laterality: Left;   RIGHT BREAST BX  08/23/2012   RIGHT HAND SURGERY  X3  LAST ONE 2009   INCLUDES  ORIF RIGHT 5TH FINGER AND REVISION TWICE   SKIN CANCER EXCISION     face,arms   TONSILLECTOMY AND ADENOIDECTOMY  AGE 67   TOTAL ABDOMINAL HYSTERECTOMY W/ BILATERAL SALPINGOOPHORECTOMY  1982   W/  APPENDECTOMY   TRANSTHORACIC ECHOCARDIOGRAM  06/24/2012   GRADE I  DIASTOLIC DYSFUNCTION/  EF 55-60%/  MILD MR   UPPER GASTROINTESTINAL ENDOSCOPY     Patient Active Problem List   Diagnosis Date Noted   Pedal edema 05/20/2024   Otalgia of both ears 04/23/2024   Tinnitus of both ears 04/23/2024   Hypokalemia 12/10/2023   IDA (iron deficiency anemia) 09/12/2023   Stage 3 chronic kidney disease    Right ankle pain 07/31/2023   Common bile duct dilatation 02/13/2023   Oral herpes 02/05/2023   Elevated alkaline phosphatase level 01/25/2023   Vertigo 01/22/2023   Intertrigo 12/13/2022   Hyperkalemia 09/28/2022   Anemia 09/28/2022   Tremor 07/05/2022   RUQ pain 07/05/2022   Leukocytosis 07/05/2022   Sjogren's disease 12/12/2021   Vocal cord cyst 05/18/2021   Vitamin D  deficiency 05/18/2021   Dysuria 03/09/2021   Vitamin B12 deficiency 03/08/2021   Hyperglycemia 03/08/2021   OSA and COPD overlap syndrome 06/10/2020   Recurrent falls 05/02/2020   Statin intolerance 02/29/2020   RLS (restless legs syndrome) 11/09/2019   Recurrent sinusitis 02/25/2019   History of colonic polyp 02/25/2019   DDD (degenerative disc disease), cervical 09/17/2018   Degenerative scoliosis 04/22/2018   Primary  insomnia 06/25/2017   Chronic interstitial cystitis 02/01/2017   Deviated nasal septum 05/12/2016   Hypertrophy of nasal turbinates 05/12/2016   Dysphagia    Allergic rhinitis 01/25/2016   Dyspnea    Sciatica 04/30/2015   Bilateral carpal tunnel syndrome 03/04/2015   Hyperlipidemia 03/01/2015   Pulmonary nodules 02/12/2015   External hemorrhoids 02/05/2015   Chronic night sweats 01/08/2015   Peripheral neuropathy 05/22/2014   Headache 10/13/2013   Arthralgia 08/18/2013   ANA positive 07/29/2013   Depression with anxiety 02/05/2013   Malignant neoplasm of lower-outer quadrant of right breast of female, estrogen receptor positive 01/03/2013   Asthma, allergic 08/05/2012   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   GERD (gastroesophageal reflux disease) 06/03/2012   Stricture and stenosis of esophagus 10/19/2011   Osteoporosis 04/17/2011   Fibrocystic breast disease 10/18/2010   Essential hypertension 08/25/2010   CAD in native artery 08/25/2010   Generalized anxiety disorder 08/03/2010   Bronchitis 03/10/2010   Fibromyalgia 01/11/2010   PCP: Domenica Harlene LABOR, MD  REFERRING PROVIDER: Barton Drape, MD (peroneal tendinitis)  Bonner Ade, MD (radiculopathy)   REFERRING DIAG: M76.72 (ICD-10-CM) - Peroneal tendinitis, left leg   M54.16 (ICD-10-CM) - Radiculopathy, lumbar region   THERAPY DIAG:  Muscle weakness (generalized)  Other abnormalities of gait and mobility  Unsteadiness on feet  Pain in left ankle and joints of left foot  Dizziness and giddiness  Radiculopathy, lumbar region  Rationale for Evaluation and Treatment: Rehabilitation  ONSET DATE: 03/12/24  SUBJECTIVE:  SUBJECTIVE STATEMENT: Patient reports she has had problems with her back for many years. She has degenerative disc and scolosis. She has been receiving injections for years, but they have not provided pain relief. Physical therapy has been helpful previously. She takes tramadol  for her pain. The  pain is along the low back and will radiate into her posterior hips and thighs. She reports numbness and tingling that is constant about her posterior hips and thighs. The only thing that takes away her numbness and tingling if the tramadol  and gabapentin . She does have peripheral neuropathy in both feet. The back pain is constant, but has recently been worsening.   PERTINENT HISTORY: S/p Lt ankle arthroscopy Brostrom stabilization, peroneus brevis and longus debridement and tenosynovectomy 03/12/24 See extensive PMH above   PAIN:  Are you having  pain? Yes: NPRS scale: 7/10 Pain location: low back Pain description: sharp Aggravating factors: bending,sitting Relieving factors: laying on left side  PRECAUTIONS: Fall,progress per protocol; osteoporosis   WEIGHT BEARING RESTRICTIONS: No  FALLS:  Has patient fallen in last 6 months? Yes. Number of falls 2 (fell stepping up grassy hill, fell in her kitchen from turning quickly)  LIVING ENVIRONMENT: Lives with: lives with their spouse Lives in: House/apartment Stairs: Yes: Internal: 16 steps; on left going up Has following equipment at home: Single point cane  PATIENT GOALS: I want to be able to walk again.  NEXT MD VISIT: November 2025   OBJECTIVE:  Note: Objective measures were completed at Evaluation unless otherwise noted.  DIAGNOSTIC FINDINGS: none on file   PATIENT SURVEYS:  FADI: 46/104; 44% 06/26/24: FADI: 67/100; 67%  08/11/24: modified oswestry 30/50; 60% disability   COGNITION: Overall cognitive status: Within functional limits for tasks assessed     SENSATION: See palpation  EDEMA:  Mild swelling about Lt ankle   PALPATION: Hypersensitivity to palpation about distal fibula, lateral malleolus  LUMBAR ROM:   Active  A/PROM  eval  Flexion 50% limited LBP and RLE pain   Extension WNL LBP  Right lateral flexion 25% limited  Left lateral flexion 25% limited   Right rotation WNL  Left rotation WNL   (Blank  rows = not tested)  LOWER EXTREMITY ROM: Active ROM Right eval Left eval 05/05/24 Left  05/20/24 Left  06/03/24 Left   Hip flexion       Hip extension       Hip abduction       Hip adduction       Hip internal rotation       Hip external rotation       Knee flexion       Knee extension       Ankle dorsiflexion  6 9  10   Ankle plantarflexion  50  65 67  Ankle inversion  15  15 17   Ankle eversion  8  8 9    (Blank rows = not tested)  LOWER EXTREMITY MMT: MMT Right eval Left eval 06/26/24 Left  08/04/24 Left  08/11/24   Hip flexion     Rt: 4; Lt: 4-  Hip extension       Hip abduction     4 bilateral   Hip adduction       Hip internal rotation       Hip external rotation       Knee flexion       Knee extension       Ankle dorsiflexion  3+ 4+ 4+   Ankle plantarflexion  3+ sitting 4- in sitting 4 in sitting    Ankle inversion  3+ 4- 4   Ankle eversion  3+ 4 5    (Blank rows = not tested)  FUNCTIONAL TESTS:  TUG: 13.5 seconds Romberg x 30 seconds Semi-tandem unable   06/09/24: TUG: 11.1 seconds   06/26/24: tandem 1 second   08/11/24: Rt rolling requires use of UE support on the hip to roll, increased pain   GAIT: Distance walked: 20 ft  Assistive device utilized: Single point cane. Ankle brace Level of assistance: Modified independence Comments: limited push-off LLE, foot ER,  [facility] Adult PT Treatment:  DATE: 08/11/24  Neuromuscular re-ed: Supine posterior pelvic tilt x 10  Resisted hooklying hip abduction red band x 10  Hooklying hip adduction isometric ball squeeze x 10   Self Care: Bed mobility and sleep positioning Posture    [facility] Adult PT Treatment:                                                DATE: 08/06/24 Therapeutic Exercise: Calf stretch on wedge x 30 sec  Gastroc stretch at wall x 30 sec each Reviewed and updated HEP   Neuromuscular re-ed: Romberg EC  2 x 20 sec, 1 x 30 sec  Romberg with horizontal  head turns x 10  VOR x 1 horizontal/vertical x 30 sec, normal stance  VOR x 1 horizontal 2 x 10 second, romberg dizzy 6/10  Standing ball toss to self x 10     [facility] Adult PT Treatment:                                                DATE: 08/04/24 Therapeutic Exercise: Eccentric calf raise 2 x 10  Reviewed and updated HEP  Neuromuscular re-ed: Semi-tandem 2 x 30 sec each Romberg x 30 sec Romberg EC x 3 seconds Normal stance EC x 30 seconds Narrow stance EC x 19 seconds  Therapeutic Activity: Re-assessment to determine overall progress, educating patient on progress towards goals   Self Care: Recommended seeking care for what patient's believe is a sinus headache     PATIENT EDUCATION:  Education details:see treatment Person educated: Patient Education method: Explanation, demo, handout Education comprehension: verbalized understanding,returned demo   HOME EXERCISE PROGRAM: Access Code: [email or web address] URL: [email or web address] Date: 08/11/2024 Prepared by: Lucie Meeter  Exercises - Seated Plantar Fascia Mobilization with Small Ball  - 1 x daily - 7 x weekly - 3-5 minute  hold - Seated Great Toe Extension  - 1 x daily - 7 x weekly - 2 sets - 10 reps - Long Sitting Ankle Plantar Flexion with Resistance  - 1 x daily - 7 x weekly - 2 sets - 10 reps - Long Sitting Ankle Inversion with Resistance  - 1 x daily - 7 x weekly - 2 sets - 10 reps - Toe Raises with Counter Support  - 1 x daily - 7 x weekly - 2 sets - 10 reps - Heel Raises with Counter Support  - 1 x daily - 7 x weekly - 2 sets - 10 reps - Towel Scrunches  - 1 x daily - 7 x weekly - 1 sets - 5 reps - Rolling rightleft sides for vestibular habituation  - 1-2 x daily - 7 x weekly - 1 sets - 10 reps - Seated Right Head Turns Vestibular Habituation  - 1-2 x daily - 7 x weekly - 1 sets - 5 reps - Standing Gaze Stabilization with Head Rotation  - 1 x daily - 7 x weekly - 2 sets - 10 reps - Standing Hip Abduction  with Counter Support  - 1 x daily - 7 x weekly - 2 sets - 10 reps - Standing Romberg to 3/4 Tandem Stance  - 1 x daily - 7 x weekly - 3 sets - max  hold -  Gastroc Stretch on Wall  - 1 x daily - 7 x weekly - 3 sets - 30 sec  hold - Supine Posterior Pelvic Tilt  - 1 x daily - 7 x weekly - 2 sets - 10 reps - Hooklying Clamshell with Resistance  - 1 x daily - 7 x weekly - 2 sets - 10 reps - Supine Hip Adduction Isometric with Ball  - 1 x daily - 7 x weekly - 2 sets - 10 reps  ASSESSMENT:  CLINICAL IMPRESSION: Patient with new referral to evaluate and treat radiculopathy, lumbar region. Upon assessment she is noted to have limited and painful trunk flexion AROM, bilateral hip weakness, limited bed mobility and functional mobility secondary to pain. Today's session focused on updating HEP to include core and hip strengthening and discussed body mechanics with bed mobility with fairly good tolerance. She will benefit from continuing with current POC to address the above stated deficits and continue treatment of Lt ankle and dizziness.   EVAL: Patient is a 78 y.o. female who was seen today for physical therapy evaluation and treatment for s/p Lt ankle arthroscopy Brostrom stabilization, peroneus brevis and longus debridement and tenosynovectomy on 03/12/24. She demonstrates ROM, strength, gait and balance deficits that are consistent with her recent post-operative status. She will benefit from skilled PT to address the above stated deficits in order to return to optimal function.   OBJECTIVE IMPAIRMENTS: Abnormal gait, decreased activity tolerance, decreased balance, decreased endurance, decreased knowledge of condition, decreased mobility, difficulty walking, decreased ROM, decreased strength, increased edema, impaired flexibility, impaired sensation, improper body mechanics, postural dysfunction, and pain.   GOALS: Goals reviewed with patient? Yes  SHORT TERM GOALS: Target date: 06/09/2024  Patient will  be independent and compliant with initial HEP.  Baseline: issued at eval  Goal status: MET  2.  Patient will demonstrate at least 60 degrees of Lt ankle plantarflexion AROM to improve push-off during gait cycle.  Baseline: see above Goal status: MET  3.  Patient will demonstrate at least 10 degrees of Lt ankle dorsiflexion AROM to improve gait mechanics.  Baseline: see above Goal status: MET  4.  Patient will complete TUG in </= 12 seconds to reduce fall risk.  Baseline: see above Goal status: MET  LONG TERM GOALS: Target date: 09/20/24  Patient will score >/= 60/104 on the Foot and Ankle Disability Index (MDC 7) to signify clinically meaningful improvement in functional abilities.  Baseline: see above Goal status: MET  2.  Patient will maintain tandem stance for at least 10 seconds to improve gait stability.  Baseline: see above 06/26/24: tandem 1 second 08/04/24: tandem 4 second Goal status: progressing   3.  Patient will be modified independent with curb/stair negotiation.  Baseline: uses cane and requires support from spouse  06/26/24: requires cane  Goal status: MET  4.  Patient will self-report ability to tolerate at least 30 minutes of standing/walking activity.  Baseline: 10 minutes  06/26/24: 20 minutes  08/04/24: limited due to back pain and dizziness, 10 minutes  Goal status: progressing   5.  Patient will demonstrate at least 4/5 Lt ankle strength to improve stability when navigating on uneven terrain.  Baseline: see above  Goal status: MET  6.  Patient will be independent with advanced home program to progress/maintain current level of function.  Baseline: initial HEP issued  Goal status: progressing    7. Patient will tolerate rolling R and L without d/o dizziness.   Baseline: 4/10 dizzines 08/04/24:  4/10 dizziness baseline    Goal status: deferred due to 4/10 baseline dizzines     8. Patient will tolerate gaze x 1 viewing in sitting x 20 seconds to work  towards improved tolerance to turns in standing.   Baseline: Unable to do 2 reps in sitting   08/04/24: VOR x 1, 20 seconds    Goal status: MET   9. Patient will score </= 40 % disability on the Oswestry Disability Index (MCID is 20%) to signify clinically meaningful improvement in functional abilities.    Baseline: see above   Goal status: NEW  10. Patient will demonstrate proper lifting mechanics to improve her tolerance to loading/unloading the dishwasher.    Baseline: increased pain, limits bending activity   Goal status: NEW    PLAN:  PT FREQUENCY: 2x/week  PT DURATION: 6 weeks  PLANNED INTERVENTIONS: 97164- PT Re-evaluation, 97750- Physical Performance Testing, 97110-Therapeutic exercises, 97530- Therapeutic activity, [id number]- Neuromuscular re-education, 97535- Self Care, [id number]- Manual therapy, [id number]- Gait training, [id number]- Aquatic Therapy, [id number]- Vasopneumatic device, Taping, Dry Needling, Cryotherapy, and Moist heat  PLAN FOR NEXT SESSION: review and progress HEP; ankle strengthening; static balance. For vestibular: Progress habituation (seated head turns>standing head turns), add brandt daroff habituation, work on gaze as tolerated in clinic with cues,  Consider BBQ roll for canolith repositioning to R if symptoms remain after habituation (responded well to habituation in clinic and may not need). Convergence-- give for HEP (only provoking mild sxs); core/hip strengthening; trial of extension?  Attallah Ontko, PT, DPT, ATC 08/11/24 4:25 PM

## 2024-08-13 ENCOUNTER — Ambulatory Visit

## 2024-08-13 DIAGNOSIS — M6281 Muscle weakness (generalized): Secondary | ICD-10-CM | POA: Diagnosis not present

## 2024-08-13 DIAGNOSIS — M25572 Pain in left ankle and joints of left foot: Secondary | ICD-10-CM

## 2024-08-13 DIAGNOSIS — M5416 Radiculopathy, lumbar region: Secondary | ICD-10-CM

## 2024-08-13 DIAGNOSIS — R2681 Unsteadiness on feet: Secondary | ICD-10-CM

## 2024-08-13 DIAGNOSIS — R2689 Other abnormalities of gait and mobility: Secondary | ICD-10-CM

## 2024-08-13 DIAGNOSIS — R42 Dizziness and giddiness: Secondary | ICD-10-CM

## 2024-08-13 NOTE — Therapy (Signed)
 OUTPATIENT PHYSICAL THERAPY TREATMENT     Patient Name: [name] MRN: [id number] DOB:[birth year], 78 y.o., female Today's Date: 08/13/2024  END OF SESSION:  PT End of Session - 08/13/24 1356     Visit Number 19    Number of Visits 28    Date for PT Re-Evaluation 09/20/24    Authorization Type [employer]    Progress Note Due on Visit 20    PT Start Time 1356    PT Stop Time 1447    PT Time Calculation (min) 51 min    Activity Tolerance Patient tolerated treatment well    Behavior During Therapy [facility] for tasks assessed/performed                Past Medical History:  Diagnosis Date   Abdominal pain 10/26/2013   Allergic rhinitis 01/25/2016   ANA positive 07/29/2013   Anemia    Arthralgia 08/18/2013   Arthritis    Asthma, allergic 08/05/2012   Atrial flutter    past history- not current   Atypical chest pain 01/01/2015   Bilateral carpal tunnel syndrome 03/04/2015   CAD in native artery 08/25/2010   Minor CAD 2009, 2011, low risk Myoview  2013 and 2016        Cataract    bilaterally removed    Cellulitis of breast 02/05/2023   Chronic constipation    Chronic cough 01/09/2017   Chronic interstitial cystitis 02/01/2017   Chronic night sweats 01/08/2015   Common bile duct dilatation 02/13/2023   Concussion    x 3   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   DDD (degenerative disc disease), cervical 09/17/2018   Degenerative scoliosis 04/22/2018   Deviated septum 05/12/2016   Dysphagia    Dyspnea    Dysuria 03/09/2021   Elevated alkaline phosphatase level 01/25/2023   Essential hypertension 08/25/2010   External hemorrhoids 02/05/2015   Facial trauma 01/22/2023   Family history of adverse reaction to anesthesia    Family history of malignant hyperthermia    father had this   Fibrocystic breast disease 10/18/2010   Fibromyalgia    Frequency of urination    Generalized anxiety disorder 08/03/2010   GERD (gastroesophageal reflux disease)     Headache 10/13/2013   High serum vitamin D  05/18/2021   History of chronic bronchitis    History of colonic polyp 02/25/2019   History of hiatal hernia    History of tachycardia    CONTROLLED  WITH ATENOLOL    Hyperglycemia 03/08/2021   Hyperkalemia 09/28/2022   Hyperlipidemia    Intertrigo 12/13/2022   Kidney stone 02/26/2019   Knee pain, right 04/30/2015   Left hip pain 04/30/2015   Leg pain 02/24/2011   Qualifier: Diagnosis of   By: Antonio DOJamee         Leukocytosis 07/05/2022   Local reaction to hymenoptera sting 05/09/2023   Low back pain 09/08/2020   Major depressive disorder 02/05/2013   Malignant neoplasm of lower-outer quadrant of right breast of female, estrogen receptor positive 01/03/2013   Nasal turbinate hypertrophy 05/12/2016   Oral herpes 02/05/2023   OSA and COPD overlap syndrome 06/10/2020   Osteoporosis 01/2019   T score -2.2 stable/improved from prior study   Pelvic pain    Peripheral neuropathy 05/22/2014   Personal history of radiation therapy    Pneumonia    Primary insomnia 06/25/2017   Pulmonary nodules 02/12/2015   Rash and other nonspecific skin eruption 05/09/2023   Recurrent falls 05/02/2020   Recurrent sinusitis  02/25/2019   Right arm pain 07/20/2016   RLS (restless legs syndrome) 11/09/2019   RUQ pain 07/05/2022   S/P radiation therapy 11/12/12 - 12/05/12   right Breast   Sciatica 04/30/2015   Sepsis 2014   from UTI    Sjogren's disease 12/12/2021   Splenic lesion 09/02/2012   Sprain of lateral ligament of ankle joint 07/31/2023   Stage 3 chronic kidney disease    Statin intolerance 02/29/2020   Stricture and stenosis of esophagus 10/19/2011   Tongue lesion 02/05/2023   Tremor 07/05/2022   Urgency of urination    Vertigo 01/22/2023   Vitamin B12 deficiency 03/08/2021   Vocal cord cyst 05/18/2021   Past Surgical History:  Procedure Laterality Date   24 HOUR PH STUDY N/A 04/03/2016   Procedure: 24 HOUR PH STUDY;  Surgeon:  Elspeth Deward Naval, MD;  Location: [facility];  Service: Gastroenterology;  Laterality: N/A;   ANKLE ARTHROSCOPY WITH RECONSTRUCTION Left 03/12/2024   Procedure: ANKLE ARTHROSCOPY WITH OPEN LATERAL ANKLE LIGAMENT STABILIZATION;  Surgeon: Barton Drape, MD;  Location: [facility];  Service: Orthopedics;  Laterality: Left;   BREAST BIOPSY Right 08/23/2012   ADH   BREAST BIOPSY Right 10/11/2012   Ductal Carcinoma   BREAST EXCISIONAL BIOPSY Right 09/04/2013   benign   BREAST LUMPECTOMY Right 10/11/2012   W/ SLN BX   CARDIAC CATHETERIZATION  09-13-2007  DR MORRIS   WELL-PRESERVED LVF/  DIFFUSE SCATTERED CORONARY CALCIFACATION AND ATHEROSCLEROSIS WITHOUT OBSTRUCTION   CARDIAC CATHETERIZATION  08-04-2010  DR MCALHANY   NON-OBSTRUCTIVE CAD/  pLAD 40%/  oLAD 30%/  mLAD 30%/  pRCA 30%/  EF 60%   CARDIOVASCULAR STRESS TEST  06-18-2012  DR McALHANY   LOW RISK NUCLEAR STUDY/  SMALL FIXED AREA OF MODERATELY DECREASED UPTAKE IN ANTEROSEPTAL WALL WHICH MAY BE ARTIFACTUAL/  NO ISCHEMIA/  EF 68%   COLONOSCOPY  09/29/2010   CYSTOSCOPY     CYSTOSCOPY WITH HYDRODISTENSION AND BIOPSY N/A 03/06/2014   Procedure: CYSTOSCOPY/HYDRODISTENSION/ INSTILATION OF MARCAINE  AND PYRIDIUM ;  Surgeon: Arlena LILLETTE Gal, MD;  Location: [facility];  Service: Urology;  Laterality: N/A;   CYSTOSCOPY/URETEROSCOPY/HOLMIUM LASER/STENT PLACEMENT Left 03/21/2023   Procedure: CYSTOSCOPY LEFT RETROGRADE PYELOGRAM, LEFT URETEROSCOPY, HOLMIUM LASER LITHOTRIPSYM, AND LEFT URETERAL STENT PLACEMENT;  Surgeon: Devere Lonni Righter, MD;  Location: [facility];  Service: Urology;  Laterality: Left;  60 MINUTES   ESOPHAGEAL MANOMETRY N/A 04/03/2016   Procedure: ESOPHAGEAL MANOMETRY (EM);  Surgeon: Elspeth Deward Naval, MD;  Location: [facility];  Service: Gastroenterology;  Laterality: N/A;   EXTRACORPOREAL SHOCK WAVE LITHOTRIPSY Left 02/06/2019   Procedure: EXTRACORPOREAL SHOCK WAVE LITHOTRIPSY  (ESWL);  Surgeon: Ottelin, Mark, MD;  Location: [facility];  Service: Urology;  Laterality: Left;   NASAL SINUS SURGERY  1985   ORIF RIGHT ANKLE  FX  2006   POLYPECTOMY     REMOVAL VOCAL CORD CYST  01/2013   REPAIR OF PERONEUS BREVIS TENDON Left 03/12/2024   Procedure: REPAIR OF PERONEUS TENDON;  Surgeon: Barton Drape, MD;  Location: [facility];  Service: Orthopedics;  Laterality: Left;   RIGHT BREAST BX  08/23/2012   RIGHT HAND SURGERY  X3  LAST ONE 2009   INCLUDES  ORIF RIGHT 5TH FINGER AND REVISION TWICE   SKIN CANCER EXCISION     face,arms   TONSILLECTOMY AND ADENOIDECTOMY  AGE 46   TOTAL ABDOMINAL HYSTERECTOMY W/ BILATERAL SALPINGOOPHORECTOMY  1982   W/  APPENDECTOMY   TRANSTHORACIC ECHOCARDIOGRAM  06/24/2012  GRADE I DIASTOLIC DYSFUNCTION/  EF 55-60%/  MILD MR   UPPER GASTROINTESTINAL ENDOSCOPY     Patient Active Problem List   Diagnosis Date Noted   Pedal edema 05/20/2024   Otalgia of both ears 04/23/2024   Tinnitus of both ears 04/23/2024   Hypokalemia 12/10/2023   IDA (iron deficiency anemia) 09/12/2023   Stage 3 chronic kidney disease    Right ankle pain 07/31/2023   Common bile duct dilatation 02/13/2023   Oral herpes 02/05/2023   Elevated alkaline phosphatase level 01/25/2023   Vertigo 01/22/2023   Intertrigo 12/13/2022   Hyperkalemia 09/28/2022   Anemia 09/28/2022   Tremor 07/05/2022   RUQ pain 07/05/2022   Leukocytosis 07/05/2022   Sjogren's disease 12/12/2021   Vocal cord cyst 05/18/2021   Vitamin D  deficiency 05/18/2021   Dysuria 03/09/2021   Vitamin B12 deficiency 03/08/2021   Hyperglycemia 03/08/2021   OSA and COPD overlap syndrome 06/10/2020   Recurrent falls 05/02/2020   Statin intolerance 02/29/2020   RLS (restless legs syndrome) 11/09/2019   Recurrent sinusitis 02/25/2019   History of colonic polyp 02/25/2019   DDD (degenerative disc disease), cervical 09/17/2018   Degenerative scoliosis 04/22/2018   Primary insomnia 06/25/2017    Chronic interstitial cystitis 02/01/2017   Deviated nasal septum 05/12/2016   Hypertrophy of nasal turbinates 05/12/2016   Dysphagia    Allergic rhinitis 01/25/2016   Dyspnea    Sciatica 04/30/2015   Bilateral carpal tunnel syndrome 03/04/2015   Hyperlipidemia 03/01/2015   Pulmonary nodules 02/12/2015   External hemorrhoids 02/05/2015   Chronic night sweats 01/08/2015   Peripheral neuropathy 05/22/2014   Headache 10/13/2013   Arthralgia 08/18/2013   ANA positive 07/29/2013   Depression with anxiety 02/05/2013   Malignant neoplasm of lower-outer quadrant of right breast of female, estrogen receptor positive 01/03/2013   Asthma, allergic 08/05/2012   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   GERD (gastroesophageal reflux disease) 06/03/2012   Stricture and stenosis of esophagus 10/19/2011   Osteoporosis 04/17/2011   Fibrocystic breast disease 10/18/2010   Essential hypertension 08/25/2010   CAD in native artery 08/25/2010   Generalized anxiety disorder 08/03/2010   Bronchitis 03/10/2010   Fibromyalgia 01/11/2010   PCP: Domenica Harlene LABOR, MD  REFERRING PROVIDER: Barton Drape, MD (peroneal tendinitis)  Bonner Ade, MD (radiculopathy)   REFERRING DIAG: M76.72 (ICD-10-CM) - Peroneal tendinitis, left leg   M54.16 (ICD-10-CM) - Radiculopathy, lumbar region   THERAPY DIAG:  Muscle weakness (generalized)  Other abnormalities of gait and mobility  Unsteadiness on feet  Pain in left ankle and joints of left foot  Dizziness and giddiness  Radiculopathy, lumbar region  Rationale for Evaluation and Treatment: Rehabilitation  ONSET DATE: 03/12/24  SUBJECTIVE:  SUBJECTIVE STATEMENT: Patient reports she woke up with pain along the ankle last night. She may have rolled it too much on the ball and she walked on the gravel parking lot. None of these activities hurt in the moment, she just woke up in the middle of the night with pain.   PERTINENT HISTORY: S/p Lt ankle  arthroscopy Brostrom stabilization, peroneus brevis and longus debridement and tenosynovectomy 03/12/24 See extensive PMH above   PAIN:  Are you having pain? Yes: NPRS scale: 7/10 Pain location: lt lateral ankle Pain description: hot inside,bruise Aggravating factors: walking, standing Relieving factors: laying on left side  PRECAUTIONS: Fall,progress per protocol; osteoporosis   WEIGHT BEARING RESTRICTIONS: No  FALLS:  Has patient fallen in last 6 months? Yes. Number of falls 2 (fell stepping up grassy  hill, fell in her kitchen from turning quickly)  LIVING ENVIRONMENT: Lives with: lives with their spouse Lives in: House/apartment Stairs: Yes: Internal: 16 steps; on left going up Has following equipment at home: Single point cane  PATIENT GOALS: I want to be able to walk again.  NEXT MD VISIT: November 2025   OBJECTIVE:  Note: Objective measures were completed at Evaluation unless otherwise noted.  DIAGNOSTIC FINDINGS: none on file   PATIENT SURVEYS:  FADI: 46/104; 44% 06/26/24: FADI: 67/100; 67%  08/11/24: modified oswestry 30/50; 60% disability   COGNITION: Overall cognitive status: Within functional limits for tasks assessed     SENSATION: See palpation  EDEMA:  Mild swelling about Lt ankle   PALPATION: Hypersensitivity to palpation about distal fibula, lateral malleolus  LUMBAR ROM:   Active  A/PROM  eval  Flexion 50% limited LBP and RLE pain   Extension WNL LBP  Right lateral flexion 25% limited  Left lateral flexion 25% limited   Right rotation WNL  Left rotation WNL   (Blank rows = not tested)  LOWER EXTREMITY ROM: Active ROM Right eval Left eval 05/05/24 Left  05/20/24 Left  06/03/24 Left   Hip flexion       Hip extension       Hip abduction       Hip adduction       Hip internal rotation       Hip external rotation       Knee flexion       Knee extension       Ankle dorsiflexion  6 9  10   Ankle plantarflexion  50  65 67  Ankle  inversion  15  15 17   Ankle eversion  8  8 9    (Blank rows = not tested)  LOWER EXTREMITY MMT: MMT Right eval Left eval 06/26/24 Left  08/04/24 Left  08/11/24   Hip flexion     Rt: 4; Lt: 4-  Hip extension       Hip abduction     4 bilateral   Hip adduction       Hip internal rotation       Hip external rotation       Knee flexion       Knee extension       Ankle dorsiflexion  3+ 4+ 4+   Ankle plantarflexion  3+ sitting 4- in sitting 4 in sitting    Ankle inversion  3+ 4- 4   Ankle eversion  3+ 4 5    (Blank rows = not tested)  FUNCTIONAL TESTS:  TUG: 13.5 seconds Romberg x 30 seconds Semi-tandem unable   06/09/24: TUG: 11.1 seconds   06/26/24: tandem 1 second   08/11/24: Rt rolling requires use of UE support on the hip to roll, increased pain   GAIT: Distance walked: 20 ft  Assistive device utilized: Single point cane. Ankle brace Level of assistance: Modified independence Comments: limited push-off LLE, foot ER,  [facility] Adult PT Treatment:                                                DATE: 08/13/24  Neuromuscular Re-education: TA brace in hooklying x 10; 5 sec hold Posterior pelvic tilts d/c due to back pain  Hooklying hip abduction green band x 10  Isometric hip adduction x 10 Gait training: With  RW working on heel strike, increasing stance time on RLE, and knee/hip flexion  Modalities: Game ready (VASO) x 10 minutes low compression; 34 degrees; Lt ankle.  Self Care: - Recommended f/u with urgent care or surgeon if pain worsens or continues over next 1-2 days.  -Use RW currently due to pain.  - Ice for pain control    [facility] Adult PT Treatment:                                                DATE: 08/11/24  Neuromuscular re-ed: Supine posterior pelvic tilt x 10  Resisted hooklying hip abduction red band x 10  Hooklying hip adduction isometric ball squeeze x 10   Self Care: Bed mobility and sleep positioning Posture    [facility] Adult PT Treatment:                                                 DATE: 08/06/24 Therapeutic Exercise: Calf stretch on wedge x 30 sec  Gastroc stretch at wall x 30 sec each Reviewed and updated HEP   Neuromuscular re-ed: Romberg EC  2 x 20 sec, 1 x 30 sec  Romberg with horizontal head turns x 10  VOR x 1 horizontal/vertical x 30 sec, normal stance  VOR x 1 horizontal 2 x 10 second, romberg dizzy 6/10  Standing ball toss to self x 10     [facility] Adult PT Treatment:                                                DATE: 08/04/24 Therapeutic Exercise: Eccentric calf raise 2 x 10  Reviewed and updated HEP  Neuromuscular re-ed: Semi-tandem 2 x 30 sec each Romberg x 30 sec Romberg EC x 3 seconds Normal stance EC x 30 seconds Narrow stance EC x 19 seconds  Therapeutic Activity: Re-assessment to determine overall progress, educating patient on progress towards goals   Self Care: Recommended seeking care for what patient's believe is a sinus headache     PATIENT EDUCATION:  Education details:see impression; HEP Person educated: Patient Education method: Explanation, demo, handout Education comprehension: verbalized understanding,returned demo   HOME EXERCISE PROGRAM: Access Code: [email or web address] URL: [email or web address] Date: 08/13/2024 Prepared by: Lucie Meeter  Exercises - Seated Plantar Fascia Mobilization with Small Ball  - 1 x daily - 7 x weekly - 3-5 minute  hold - Seated Great Toe Extension  - 1 x daily - 7 x weekly - 2 sets - 10 reps - Long Sitting Ankle Plantar Flexion with Resistance  - 1 x daily - 7 x weekly - 2 sets - 10 reps - Long Sitting Ankle Inversion with Resistance  - 1 x daily - 7 x weekly - 2 sets - 10 reps - Toe Raises with Counter Support  - 1 x daily - 7 x weekly - 2 sets - 10 reps - Heel Raises with Counter Support  - 1 x daily - 7 x weekly - 2 sets - 10 reps - Towel Scrunches  - 1 x daily - 7 x weekly - 1 sets -  5 reps - Rolling rightleft sides for vestibular habituation  -  1-2 x daily - 7 x weekly - 1 sets - 10 reps - Seated Right Head Turns Vestibular Habituation  - 1-2 x daily - 7 x weekly - 1 sets - 5 reps - Standing Gaze Stabilization with Head Rotation  - 1 x daily - 7 x weekly - 2 sets - 10 reps - Standing Hip Abduction with Counter Support  - 1 x daily - 7 x weekly - 2 sets - 10 reps - Standing Romberg to 3/4 Tandem Stance  - 1 x daily - 7 x weekly - 3 sets - max  hold - Gastroc Stretch on Wall  - 1 x daily - 7 x weekly - 3 sets - 30 sec  hold - Hooklying Clamshell with Resistance  - 1 x daily - 7 x weekly - 2 sets - 10 reps - Supine Hip Adduction Isometric with Ball  - 1 x daily - 7 x weekly - 2 sets - 10 reps - Supine Transversus Abdominis Bracing - Hands on Stomach  - 1 x daily - 7 x weekly - 2 sets - 10 reps - 5 sec  hold  ASSESSMENT:  CLINICAL IMPRESSION: Patient arrives with onset of Lt lateral ankle pain that she awoke to in the middle of the night last night. Does not recall a MOI for this increase in pain. She has antalgic gait with SPC when walking in clinic. Utilized RW in clinic with patient demonstrating improved gait cycle, but continues to have pain about the lateral ankle. Was recommended to utilize her RW at home right now due to pain/antalgic gait. Was recommended that if her pain does not improve or worsens over the next 1-2 days to f/u with surgeon or urgent care regarding this acute onset of pain with patient verbalizing understanding. Reviewed HEP that was provided at last visit for low back pain. She does not tolerate posterior pelvic tilts due to pain so this was discontinued. Better tolerance to core activation in neutral positioning, so modified on HEP.   EVAL: Patient is a 78 y.o. female who was seen today for physical therapy evaluation and treatment for s/p Lt ankle arthroscopy Brostrom stabilization, peroneus brevis and longus debridement and tenosynovectomy on 03/12/24. She demonstrates ROM, strength, gait and balance deficits that  are consistent with her recent post-operative status. She will benefit from skilled PT to address the above stated deficits in order to return to optimal function.   OBJECTIVE IMPAIRMENTS: Abnormal gait, decreased activity tolerance, decreased balance, decreased endurance, decreased knowledge of condition, decreased mobility, difficulty walking, decreased ROM, decreased strength, increased edema, impaired flexibility, impaired sensation, improper body mechanics, postural dysfunction, and pain.   GOALS: Goals reviewed with patient? Yes  SHORT TERM GOALS: Target date: 06/09/2024  Patient will be independent and compliant with initial HEP.  Baseline: issued at eval  Goal status: MET  2.  Patient will demonstrate at least 60 degrees of Lt ankle plantarflexion AROM to improve push-off during gait cycle.  Baseline: see above Goal status: MET  3.  Patient will demonstrate at least 10 degrees of Lt ankle dorsiflexion AROM to improve gait mechanics.  Baseline: see above Goal status: MET  4.  Patient will complete TUG in </= 12 seconds to reduce fall risk.  Baseline: see above Goal status: MET  LONG TERM GOALS: Target date: 09/20/24  Patient will score >/= 60/104 on the Foot and Ankle Disability Index (MDC 7) to signify  clinically meaningful improvement in functional abilities.  Baseline: see above Goal status: MET  2.  Patient will maintain tandem stance for at least 10 seconds to improve gait stability.  Baseline: see above 06/26/24: tandem 1 second 08/04/24: tandem 4 second Goal status: progressing   3.  Patient will be modified independent with curb/stair negotiation.  Baseline: uses cane and requires support from spouse  06/26/24: requires cane  Goal status: MET  4.  Patient will self-report ability to tolerate at least 30 minutes of standing/walking activity.  Baseline: 10 minutes  06/26/24: 20 minutes  08/04/24: limited due to back pain and dizziness, 10 minutes  Goal status:  progressing   5.  Patient will demonstrate at least 4/5 Lt ankle strength to improve stability when navigating on uneven terrain.  Baseline: see above  Goal status: MET  6.  Patient will be independent with advanced home program to progress/maintain current level of function.  Baseline: initial HEP issued  Goal status: progressing    7. Patient will tolerate rolling R and L without d/o dizziness.   Baseline: 4/10 dizzines 08/04/24: 4/10 dizziness baseline    Goal status: deferred due to 4/10 baseline dizzines     8. Patient will tolerate gaze x 1 viewing in sitting x 20 seconds to work towards improved tolerance to turns in standing.   Baseline: Unable to do 2 reps in sitting   08/04/24: VOR x 1, 20 seconds    Goal status: MET   9. Patient will score </= 40 % disability on the Oswestry Disability Index (MCID is 20%) to signify clinically meaningful improvement in functional abilities.    Baseline: see above   Goal status: NEW  10. Patient will demonstrate proper lifting mechanics to improve her tolerance to loading/unloading the dishwasher.    Baseline: increased pain, limits bending activity   Goal status: NEW    PLAN:  PT FREQUENCY: 2x/week  PT DURATION: 6 weeks  PLANNED INTERVENTIONS: [id number]- PT Re-evaluation, 97750- Physical Performance Testing, 97110-Therapeutic exercises, 97530- Therapeutic activity, [id number]- Neuromuscular re-education, 97535- Self Care, [id number]- Manual therapy, [id number]- Gait training, [id number]- Aquatic Therapy, [id number]- Vasopneumatic device, Taping, Dry Needling, Cryotherapy, and Moist heat  PLAN FOR NEXT SESSION: review and progress HEP; ankle strengthening; static balance. For vestibular: Progress habituation (seated head turns>standing head turns), add brandt daroff habituation, work on gaze as tolerated in clinic with cues,  Consider BBQ roll for canolith repositioning to R if symptoms remain after habituation (responded well to habituation in clinic and may not  need). Convergence-- give for HEP (only provoking mild sxs); core/hip strengthening; trial of extension for back pain; PROGRESS NOTE!! CHECK IN ON ACUTE ONSET OF LATERAL ANKLE PAIN?  Twania Bujak, PT, DPT, ATC 08/13/24 2:44 PM

## 2024-08-14 ENCOUNTER — Ambulatory Visit (INDEPENDENT_AMBULATORY_CARE_PROVIDER_SITE_OTHER): Admitting: Student

## 2024-08-14 ENCOUNTER — Encounter: Payer: Self-pay | Admitting: Student

## 2024-08-14 ENCOUNTER — Ambulatory Visit (HOSPITAL_BASED_OUTPATIENT_CLINIC_OR_DEPARTMENT_OTHER)
Admission: RE | Admit: 2024-08-14 | Discharge: 2024-08-14 | Disposition: A | Discharge status: Home / Self Care | Source: Ambulatory Visit | Attending: Student | Admitting: Student

## 2024-08-14 ENCOUNTER — Ambulatory Visit: Payer: Self-pay | Admitting: Student

## 2024-08-14 VITALS — BP 110/68 | HR 71 | Temp 98.2°F | Resp 12 | Ht 64.5 in | Wt 127.8 lb

## 2024-08-14 DIAGNOSIS — M3500 Sicca syndrome, unspecified: Secondary | ICD-10-CM

## 2024-08-14 DIAGNOSIS — F418 Other specified anxiety disorders: Secondary | ICD-10-CM

## 2024-08-14 DIAGNOSIS — E785 Hyperlipidemia, unspecified: Secondary | ICD-10-CM

## 2024-08-14 DIAGNOSIS — I10 Essential (primary) hypertension: Secondary | ICD-10-CM

## 2024-08-14 DIAGNOSIS — M25572 Pain in left ankle and joints of left foot: Secondary | ICD-10-CM | POA: Diagnosis not present

## 2024-08-14 DIAGNOSIS — E538 Deficiency of other specified B group vitamins: Secondary | ICD-10-CM

## 2024-08-14 DIAGNOSIS — J4489 Other specified chronic obstructive pulmonary disease: Secondary | ICD-10-CM

## 2024-08-14 DIAGNOSIS — N183 Chronic kidney disease, stage 3 unspecified: Secondary | ICD-10-CM | POA: Diagnosis not present

## 2024-08-14 DIAGNOSIS — E559 Vitamin D deficiency, unspecified: Secondary | ICD-10-CM | POA: Diagnosis not present

## 2024-08-14 DIAGNOSIS — K219 Gastro-esophageal reflux disease without esophagitis: Secondary | ICD-10-CM

## 2024-08-14 DIAGNOSIS — I2510 Atherosclerotic heart disease of native coronary artery without angina pectoris: Secondary | ICD-10-CM

## 2024-08-14 DIAGNOSIS — M7989 Other specified soft tissue disorders: Secondary | ICD-10-CM | POA: Diagnosis not present

## 2024-08-14 MED ORDER — VITAMIN D (ERGOCALCIFEROL) 1.25 MG (50000 UNIT) PO CAPS: 50000.0000 [IU] | ORAL_CAPSULE | ORAL | 1 refills | Status: DC

## 2024-08-14 NOTE — Telephone Encounter (Signed)
 Rec'd fax from [facility] that they have tried to reach the patient multiple times over the last 60 days but have been unable to

## 2024-08-14 NOTE — Assessment & Plan Note (Addendum)
 Patient had reconstructive surgery left ankle in March 2025.  She has been participating in PT 2 times per week.  Last week started having pain in left ankle, pain moderate, pain with ambulation.  She is wearing a brace daily on left ankle. Will obtain x-ray left ankle, advised patient to follow-up with Ortho.  Patient agreeable to this plan. Ensure supportive shoes, rest, ice, compress, elevate ankle.

## 2024-08-18 ENCOUNTER — Ambulatory Visit: Admitting: Rehabilitative and Restorative Service Providers"

## 2024-08-18 ENCOUNTER — Encounter: Payer: Self-pay | Admitting: Rehabilitative and Restorative Service Providers"

## 2024-08-18 DIAGNOSIS — M5416 Radiculopathy, lumbar region: Secondary | ICD-10-CM

## 2024-08-18 DIAGNOSIS — M6281 Muscle weakness (generalized): Secondary | ICD-10-CM | POA: Diagnosis not present

## 2024-08-18 DIAGNOSIS — R2689 Other abnormalities of gait and mobility: Secondary | ICD-10-CM

## 2024-08-18 DIAGNOSIS — R2681 Unsteadiness on feet: Secondary | ICD-10-CM

## 2024-08-18 NOTE — Therapy (Signed)
 OUTPATIENT PHYSICAL THERAPY TREATMENT and 10th visit PROGRESS NOTE   Patient Name: DAMYA COMLEY MRN: [id number] DOB:Oct 17, 1946, 78 y.o., female Today's Date: 08/18/2024  END OF SESSION:  PT End of Session - 08/18/24 1022     Visit Number 20    Number of Visits 28    Date for PT Re-Evaluation 09/20/24    Authorization Type [employer]    Progress Note Due on Visit 30    PT Start Time 1019    PT Stop Time 1100    PT Time Calculation (min) 41 min    Activity Tolerance Patient tolerated treatment well    Behavior During Therapy [facility] for tasks assessed/performed         Past Medical History:  Diagnosis Date   Abdominal pain 10/26/2013   Allergic rhinitis 01/25/2016   ANA positive 07/29/2013   Anemia    Arthralgia 08/18/2013   Arthritis    Asthma, allergic 08/05/2012   Atrial flutter    past history- not current   Atypical chest pain 01/01/2015   Bilateral carpal tunnel syndrome 03/04/2015   CAD in native artery 08/25/2010   Minor CAD 2009, 2011, low risk Myoview  2013 and 2016        Cataract    bilaterally removed    Cellulitis of breast 02/05/2023   Chronic constipation    Chronic cough 01/09/2017   Chronic interstitial cystitis 02/01/2017   Chronic night sweats 01/08/2015   Common bile duct dilatation 02/13/2023   Concussion    x 3   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   DDD (degenerative disc disease), cervical 09/17/2018   Degenerative scoliosis 04/22/2018   Deviated septum 05/12/2016   Dysphagia    Dyspnea    Dysuria 03/09/2021   Elevated alkaline phosphatase level 01/25/2023   Essential hypertension 08/25/2010   External hemorrhoids 02/05/2015   Facial trauma 01/22/2023   Family history of adverse reaction to anesthesia    Family history of malignant hyperthermia    father had this   Fibrocystic breast disease 10/18/2010   Fibromyalgia    Frequency of urination    Generalized anxiety disorder 08/03/2010   GERD (gastroesophageal reflux  disease)    Headache 10/13/2013   High serum vitamin D  05/18/2021   History of chronic bronchitis    History of colonic polyp 02/25/2019   History of hiatal hernia    History of tachycardia    CONTROLLED  WITH ATENOLOL    Hyperglycemia 03/08/2021   Hyperkalemia 09/28/2022   Hyperlipidemia    Intertrigo 12/13/2022   Kidney stone 02/26/2019   Knee pain, right 04/30/2015   Left hip pain 04/30/2015   Leg pain 02/24/2011   Qualifier: Diagnosis of   By: Antonio DOJamee         Leukocytosis 07/05/2022   Local reaction to hymenoptera sting 05/09/2023   Low back pain 09/08/2020   Major depressive disorder 02/05/2013   Malignant neoplasm of lower-outer quadrant of right breast of female, estrogen receptor positive 01/03/2013   Nasal turbinate hypertrophy 05/12/2016   Oral herpes 02/05/2023   OSA and COPD overlap syndrome 06/10/2020   Osteoporosis 01/2019   T score -2.2 stable/improved from prior study   Pelvic pain    Peripheral neuropathy 05/22/2014   Personal history of radiation therapy    Pneumonia    Primary insomnia 06/25/2017   Pulmonary nodules 02/12/2015   Rash and other nonspecific skin eruption 05/09/2023   Recurrent falls 05/02/2020   Recurrent sinusitis 02/25/2019   Right  arm pain 07/20/2016   RLS (restless legs syndrome) 11/09/2019   RUQ pain 07/05/2022   S/P radiation therapy 11/12/12 - 12/05/12   right Breast   Sciatica 04/30/2015   Sepsis 2014   from UTI    Sjogren's disease 12/12/2021   Splenic lesion 09/02/2012   Sprain of lateral ligament of ankle joint 07/31/2023   Stage 3 chronic kidney disease    Statin intolerance 02/29/2020   Stricture and stenosis of esophagus 10/19/2011   Tongue lesion 02/05/2023   Tremor 07/05/2022   Urgency of urination    Vertigo 01/22/2023   Vitamin B12 deficiency 03/08/2021   Vocal cord cyst 05/18/2021   Past Surgical History:  Procedure Laterality Date   24 HOUR PH STUDY N/A 04/03/2016   Procedure: 24 HOUR PH STUDY;   Surgeon: Elspeth Deward Naval, MD;  Location: [facility];  Service: Gastroenterology;  Laterality: N/A;   ANKLE ARTHROSCOPY WITH RECONSTRUCTION Left 03/12/2024   Procedure: ANKLE ARTHROSCOPY WITH OPEN LATERAL ANKLE LIGAMENT STABILIZATION;  Surgeon: Barton Drape, MD;  Location: [facility];  Service: Orthopedics;  Laterality: Left;   BREAST BIOPSY Right 08/23/2012   ADH   BREAST BIOPSY Right 10/11/2012   Ductal Carcinoma   BREAST EXCISIONAL BIOPSY Right 09/04/2013   benign   BREAST LUMPECTOMY Right 10/11/2012   W/ SLN BX   CARDIAC CATHETERIZATION  09-13-2007  DR MORRIS   WELL-PRESERVED LVF/  DIFFUSE SCATTERED CORONARY CALCIFACATION AND ATHEROSCLEROSIS WITHOUT OBSTRUCTION   CARDIAC CATHETERIZATION  08-04-2010  DR MCALHANY   NON-OBSTRUCTIVE CAD/  pLAD 40%/  oLAD 30%/  mLAD 30%/  pRCA 30%/  EF 60%   CARDIOVASCULAR STRESS TEST  06-18-2012  DR McALHANY   LOW RISK NUCLEAR STUDY/  SMALL FIXED AREA OF MODERATELY DECREASED UPTAKE IN ANTEROSEPTAL WALL WHICH MAY BE ARTIFACTUAL/  NO ISCHEMIA/  EF 68%   COLONOSCOPY  09/29/2010   CYSTOSCOPY     CYSTOSCOPY WITH HYDRODISTENSION AND BIOPSY N/A 03/06/2014   Procedure: CYSTOSCOPY/HYDRODISTENSION/ INSTILATION OF MARCAINE  AND PYRIDIUM ;  Surgeon: Arlena LILLETTE Gal, MD;  Location: [facility];  Service: Urology;  Laterality: N/A;   CYSTOSCOPY/URETEROSCOPY/HOLMIUM LASER/STENT PLACEMENT Left 03/21/2023   Procedure: CYSTOSCOPY LEFT RETROGRADE PYELOGRAM, LEFT URETEROSCOPY, HOLMIUM LASER LITHOTRIPSYM, AND LEFT URETERAL STENT PLACEMENT;  Surgeon: Devere Lonni Righter, MD;  Location: [facility];  Service: Urology;  Laterality: Left;  60 MINUTES   ESOPHAGEAL MANOMETRY N/A 04/03/2016   Procedure: ESOPHAGEAL MANOMETRY (EM);  Surgeon: Elspeth Deward Naval, MD;  Location: [facility];  Service: Gastroenterology;  Laterality: N/A;   EXTRACORPOREAL SHOCK WAVE LITHOTRIPSY Left 02/06/2019   Procedure: EXTRACORPOREAL SHOCK WAVE  LITHOTRIPSY (ESWL);  Surgeon: Ottelin, Mark, MD;  Location: [facility];  Service: Urology;  Laterality: Left;   NASAL SINUS SURGERY  1985   ORIF RIGHT ANKLE  FX  2006   POLYPECTOMY     REMOVAL VOCAL CORD CYST  01/2013   REPAIR OF PERONEUS BREVIS TENDON Left 03/12/2024   Procedure: REPAIR OF PERONEUS TENDON;  Surgeon: Barton Drape, MD;  Location: [facility];  Service: Orthopedics;  Laterality: Left;   RIGHT BREAST BX  08/23/2012   RIGHT HAND SURGERY  X3  LAST ONE 2009   INCLUDES  ORIF RIGHT 5TH FINGER AND REVISION TWICE   SKIN CANCER EXCISION     face,arms   TONSILLECTOMY AND ADENOIDECTOMY  AGE 40   TOTAL ABDOMINAL HYSTERECTOMY W/ BILATERAL SALPINGOOPHORECTOMY  1982   W/  APPENDECTOMY   TRANSTHORACIC ECHOCARDIOGRAM  06/24/2012   GRADE I DIASTOLIC  DYSFUNCTION/  EF 55-60%/  MILD MR   UPPER GASTROINTESTINAL ENDOSCOPY     Patient Active Problem List   Diagnosis Date Noted   Acute left ankle pain 08/14/2024   Pedal edema 05/20/2024   Otalgia of both ears 04/23/2024   Tinnitus of both ears 04/23/2024   Hypokalemia 12/10/2023   IDA (iron deficiency anemia) 09/12/2023   Stage 3 chronic kidney disease    Right ankle pain 07/31/2023   Common bile duct dilatation 02/13/2023   Oral herpes 02/05/2023   Elevated alkaline phosphatase level 01/25/2023   Vertigo 01/22/2023   Intertrigo 12/13/2022   Hyperkalemia 09/28/2022   Anemia 09/28/2022   Tremor 07/05/2022   RUQ pain 07/05/2022   Leukocytosis 07/05/2022   Sjogren's disease 12/12/2021   Vocal cord cyst 05/18/2021   Vitamin D  deficiency 05/18/2021   Dysuria 03/09/2021   Vitamin B12 deficiency 03/08/2021   Hyperglycemia 03/08/2021   OSA and COPD overlap syndrome 06/10/2020   Recurrent falls 05/02/2020   Statin intolerance 02/29/2020   RLS (restless legs syndrome) 11/09/2019   Recurrent sinusitis 02/25/2019   History of colonic polyp 02/25/2019   DDD (degenerative disc disease), cervical 09/17/2018   Degenerative  scoliosis 04/22/2018   Primary insomnia 06/25/2017   Chronic interstitial cystitis 02/01/2017   Deviated nasal septum 05/12/2016   Hypertrophy of nasal turbinates 05/12/2016   Dysphagia    Allergic rhinitis 01/25/2016   Dyspnea    Sciatica 04/30/2015   Bilateral carpal tunnel syndrome 03/04/2015   Hyperlipidemia 03/01/2015   Pulmonary nodules 02/12/2015   External hemorrhoids 02/05/2015   Chronic night sweats 01/08/2015   Peripheral neuropathy 05/22/2014   Headache 10/13/2013   Arthralgia 08/18/2013   ANA positive 07/29/2013   Depression with anxiety 02/05/2013   Malignant neoplasm of lower-outer quadrant of right breast of female, estrogen receptor positive 01/03/2013   Asthma, allergic 08/05/2012   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   GERD (gastroesophageal reflux disease) 06/03/2012   Stricture and stenosis of esophagus 10/19/2011   Osteoporosis 04/17/2011   Fibrocystic breast disease 10/18/2010   Essential hypertension 08/25/2010   CAD in native artery 08/25/2010   Generalized anxiety disorder 08/03/2010   Bronchitis 03/10/2010   Fibromyalgia 01/11/2010   PCP: Domenica Harlene LABOR, MD  REFERRING PROVIDER: Barton Drape, MD (peroneal tendinitis)  Bonner Ade, MD (radiculopathy)   REFERRING DIAG: M76.72 (ICD-10-CM) - Peroneal tendinitis, left leg   M54.16 (ICD-10-CM) - Radiculopathy, lumbar region   THERAPY DIAG:  Muscle weakness (generalized)  Other abnormalities of gait and mobility  Unsteadiness on feet  Pain in left ankle and joints of left foot  Dizziness and giddiness  Radiculopathy, lumbar region  Rationale for Evaluation and Treatment: Rehabilitation  ONSET DATE: 03/12/24  SUBJECTIVE:  SUBJECTIVE STATEMENT: The patient reports she got an xray of her foot and no fx, but she had some soft tissue swelling. She thinks she rolled it too much on the tennis ball.  The dizziness is better. She can roll in the bed and do her exercises without dizziness  although her eyes get weary with gaze exercises.  She still struggles with picking up items at the grocery stores due to visual feedback and hard floors (hurt feet). She feels the neuropathy is getting worse in hands/feet. She reported being sore after last therapy session in her deep core muscles.  Her main compliant today is of increased back pain.   PERTINENT HISTORY: S/p Lt ankle arthroscopy Brostrom stabilization, peroneus brevis and longus debridement and tenosynovectomy 03/12/24 See extensive PMH  above   PAIN:  Are you having pain? Yes: NPRS scale: 7/10 Pain location:  low back 7/10, lt lateral ankle and foot is down to 5/10 today Pain description: hot inside,bruise Aggravating factors: walking, standing Relieving factors: laying on left side  PRECAUTIONS: Fall,progress per protocol; osteoporosis   WEIGHT BEARING RESTRICTIONS: No  FALLS:  Has patient fallen in last 6 months? Yes. Number of falls 2 (fell stepping up grassy hill, fell in her kitchen from turning quickly)  LIVING ENVIRONMENT: Lives with: lives with their spouse Lives in: House/apartment Stairs: Yes: Internal: 16 steps; on left going up Has following equipment at home: Single point cane  PATIENT GOALS: I want to be able to walk again.  NEXT MD VISIT: November 2025   OBJECTIVE:  Note: Objective measures were completed at Evaluation unless otherwise noted.  DIAGNOSTIC FINDINGS: none on file   PATIENT SURVEYS:  FADI: 46/104; 44% 06/26/24: FADI: 67/100; 67%  08/11/24: modified oswestry 30/50; 60% disability   COGNITION: Overall cognitive status: Within functional limits for tasks assessed     SENSATION: See palpation  EDEMA:  Mild swelling about Lt ankle   PALPATION: Hypersensitivity to palpation about distal fibula, lateral malleolus  LUMBAR ROM:   Active  A/PROM  eval  Flexion 50% limited LBP and RLE pain   Extension WNL LBP  Right lateral flexion 25% limited  Left lateral flexion 25%  limited   Right rotation WNL  Left rotation WNL   (Blank rows = not tested)  LOWER EXTREMITY ROM: Active ROM Right eval Left eval 05/05/24 Left  05/20/24 Left  06/03/24 Left   Hip flexion       Hip extension       Hip abduction       Hip adduction       Hip internal rotation       Hip external rotation       Knee flexion       Knee extension       Ankle dorsiflexion  6 9  10   Ankle plantarflexion  50  65 67  Ankle inversion  15  15 17   Ankle eversion  8  8 9    (Blank rows = not tested)  LOWER EXTREMITY MMT: MMT Right eval Left eval 06/26/24 Left  08/04/24 Left  08/11/24   Hip flexion     Rt: 4; Lt: 4-  Hip extension       Hip abduction     4 bilateral   Hip adduction       Hip internal rotation       Hip external rotation       Knee flexion       Knee extension       Ankle dorsiflexion  3+ 4+ 4+   Ankle plantarflexion  3+ sitting 4- in sitting 4 in sitting    Ankle inversion  3+ 4- 4   Ankle eversion  3+ 4 5    (Blank rows = not tested)  FUNCTIONAL TESTS:  TUG: 13.5 seconds Romberg x 30 seconds Semi-tandem unable   06/09/24: TUG: 11.1 seconds   06/26/24: tandem 1 second   08/11/24: Rt rolling requires use of UE support on the hip to roll, increased pain   GAIT: Distance walked: 20 ft  Assistive device utilized: Single point cane. Ankle brace Level of assistance: Modified independence Comments: limited push-off LLE, foot ER,   [facility] Adult PT Treatment:  DATE: 08/18/24 Therapeutic Exercise: Supine Bent knee fallouts 5 reps R and L sides Standing  lumbar extension at countertop x 5 reps within tolerable range of motion lumbar flexion at countertop with cues to stay within tolerable range Hip adduction stretch Manual Therapy: Self massage with deflated pilates ball on sacrum in supine Self massage trying massage ball against wall on R glut-- too painful and not able to tolerate IASTM glut med and R sacral  ligaments with some discomfort-- performed to tolerance Neuromuscular re-ed: TA brace in hooklying x 10 reps x 5 second holds-- initially had muscle spasm, but improved  Therapeutic Activity: Backwards walking for glutes firing 15 feet x 3 reps with SBA   [facility] Adult PT Treatment:                                                DATE: 08/13/24 Neuromuscular Re-education: TA brace in hooklying x 10; 5 sec hold Posterior pelvic tilts d/c due to back pain  Hooklying hip abduction green band x 10  Isometric hip adduction x 10 Gait training: With RW working on heel strike, increasing stance time on RLE, and knee/hip flexion Modalities: Game ready (VASO) x 10 minutes low compression; 34 degrees; Lt ankle.  Self Care: - Recommended f/u with urgent care or surgeon if pain worsens or continues over next 1-2 days.  -Use RW currently due to pain.  - Ice for pain control    [facility] Adult PT Treatment:                                                DATE: 08/11/24 Neuromuscular re-ed: Supine posterior pelvic tilt x 10  Resisted hooklying hip abduction red band x 10  Hooklying hip adduction isometric ball squeeze x 10  Self Care: Bed mobility and sleep positioning Posture    [facility] Adult PT Treatment:                                                DATE: 08/06/24 Therapeutic Exercise: Calf stretch on wedge x 30 sec  Gastroc stretch at wall x 30 sec each Reviewed and updated HEP  Neuromuscular re-ed: Romberg EC  2 x 20 sec, 1 x 30 sec  Romberg with horizontal head turns x 10  VOR x 1 horizontal/vertical x 30 sec, normal stance  VOR x 1 horizontal 2 x 10 second, romberg dizzy 6/10  Standing ball toss to self x 10     [facility] Adult PT Treatment:                                                DATE: 08/04/24 Therapeutic Exercise: Eccentric calf raise 2 x 10  Reviewed and updated HEP Neuromuscular re-ed: Semi-tandem 2 x 30 sec each Romberg x 30 sec Romberg EC x 3 seconds Normal stance EC x 30  seconds Narrow stance EC x 19 seconds  Therapeutic Activity: Re-assessment to determine overall progress, educating patient  on progress towards goals   Self Care: Recommended seeking care for what patient's believe is a sinus headache     PATIENT EDUCATION:  Education details:see impression; HEP Person educated: Patient Education method: Explanation, demo, handout Education comprehension: verbalized understanding,returned demo   HOME EXERCISE PROGRAM: Access Code: [email or web address] URL: [email or web address] Date: 08/13/2024 Prepared by: Lucie Meeter  Exercises - Seated Plantar Fascia Mobilization with Small Ball  - 1 x daily - 7 x weekly - 3-5 minute  hold - Seated Great Toe Extension  - 1 x daily - 7 x weekly - 2 sets - 10 reps - Long Sitting Ankle Plantar Flexion with Resistance  - 1 x daily - 7 x weekly - 2 sets - 10 reps - Long Sitting Ankle Inversion with Resistance  - 1 x daily - 7 x weekly - 2 sets - 10 reps - Toe Raises with Counter Support  - 1 x daily - 7 x weekly - 2 sets - 10 reps - Heel Raises with Counter Support  - 1 x daily - 7 x weekly - 2 sets - 10 reps - Towel Scrunches  - 1 x daily - 7 x weekly - 1 sets - 5 reps - Rolling rightleft sides for vestibular habituation  - 1-2 x daily - 7 x weekly - 1 sets - 10 reps - Seated Right Head Turns Vestibular Habituation  - 1-2 x daily - 7 x weekly - 1 sets - 5 reps - Standing Gaze Stabilization with Head Rotation  - 1 x daily - 7 x weekly - 2 sets - 10 reps - Standing Hip Abduction with Counter Support  - 1 x daily - 7 x weekly - 2 sets - 10 reps - Standing Romberg to 3/4 Tandem Stance  - 1 x daily - 7 x weekly - 3 sets - max  hold - Gastroc Stretch on Wall  - 1 x daily - 7 x weekly - 3 sets - 30 sec  hold - Hooklying Clamshell with Resistance  - 1 x daily - 7 x weekly - 2 sets - 10 reps - Supine Hip Adduction Isometric with Ball  - 1 x daily - 7 x weekly - 2 sets - 10 reps - Supine Transversus Abdominis Bracing -  Hands on Stomach  - 1 x daily - 7 x weekly - 2 sets - 10 reps - 5 sec  hold  ASSESSMENT:  CLINICAL IMPRESSION: The patient gets increased pain with standing flexion at counter and standing extension near her end range. Therefore returned to supine mat exercises to reduce discomfort. Patient tolerated self massage and felt relief with deflated pilates ball to mobilize low back region.  PT also worked on [employer] with posterior walking and strengthening within tolerance. Plan to progress to patient tolerance. Her foot is still sore leading to antalgic gait.   EVAL: Patient is a 78 y.o. female who was seen today for physical therapy evaluation and treatment for s/p Lt ankle arthroscopy Brostrom stabilization, peroneus brevis and longus debridement and tenosynovectomy on 03/12/24. She demonstrates ROM, strength, gait and balance deficits that are consistent with her recent post-operative status. She will benefit from skilled PT to address the above stated deficits in order to return to optimal function.   OBJECTIVE IMPAIRMENTS: Abnormal gait, decreased activity tolerance, decreased balance, decreased endurance, decreased knowledge of condition, decreased mobility, difficulty walking, decreased ROM, decreased strength, increased edema, impaired flexibility, impaired sensation, improper body mechanics, postural dysfunction, and pain.   GOALS:  Goals reviewed with patient? Yes  SHORT TERM GOALS: Target date: 06/09/2024  Patient will be independent and compliant with initial HEP.  Baseline: issued at eval  Goal status: MET  2.  Patient will demonstrate at least 60 degrees of Lt ankle plantarflexion AROM to improve push-off during gait cycle.  Baseline: see above Goal status: MET  3.  Patient will demonstrate at least 10 degrees of Lt ankle dorsiflexion AROM to improve gait mechanics.  Baseline: see above Goal status: MET  4.  Patient will complete TUG in </= 12 seconds to reduce fall risk.   Baseline: see above Goal status: MET  LONG TERM GOALS: Target date: 09/20/24  Patient will score >/= 60/104 on the Foot and Ankle Disability Index (MDC 7) to signify clinically meaningful improvement in functional abilities.  Baseline: see above Goal status: MET  2.  Patient will maintain tandem stance for at least 10 seconds to improve gait stability.  Baseline: see above 06/26/24: tandem 1 second 08/04/24: tandem 4 second Goal status: progressing   3.  Patient will be modified independent with curb/stair negotiation.  Baseline: uses cane and requires support from spouse  06/26/24: requires cane  Goal status: MET  4.  Patient will self-report ability to tolerate at least 30 minutes of standing/walking activity.  Baseline: 10 minutes  06/26/24: 20 minutes  08/04/24: limited due to back pain and dizziness, 10 minutes  Goal status: progressing   5.  Patient will demonstrate at least 4/5 Lt ankle strength to improve stability when navigating on uneven terrain.  Baseline: see above  Goal status: MET  6.  Patient will be independent with advanced home program to progress/maintain current level of function.  Baseline: initial HEP issued  Goal status: progressing    7. Patient will tolerate rolling R and L without d/o dizziness.   Baseline: 4/10 dizzines 08/04/24: 4/10 dizziness baseline    Goal status: deferred due to 4/10 baseline dizzines     8. Patient will tolerate gaze x 1 viewing in sitting x 20 seconds to work towards improved tolerance to turns in standing.   Baseline: Unable to do 2 reps in sitting   08/04/24: VOR x 1, 20 seconds    Goal status: MET   9. Patient will score </= 40 % disability on the Oswestry Disability Index (MCID is 20%) to signify clinically meaningful improvement in functional abilities.    Baseline: see above   Goal status: NEW  10. Patient will demonstrate proper lifting mechanics to improve her tolerance to loading/unloading the dishwasher.     Baseline: increased pain, limits bending activity   Goal status: NEW  PLAN:  PT FREQUENCY: 2x/week  PT DURATION: 6 weeks  PLANNED INTERVENTIONS: [id number]- PT Re-evaluation, 97750- Physical Performance Testing, 97110-Therapeutic exercises, 97530- Therapeutic activity, [id number]- Neuromuscular re-education, 97535- Self Care, [id number]- Manual therapy, [id number]- Gait training, [id number]- Aquatic Therapy, [id number]- Vasopneumatic device, Taping, Dry Needling, Cryotherapy, and Moist heat  PLAN FOR NEXT SESSION: review and progress HEP; ankle strengthening; static balance. For vestibular: Progress habituation (seated head turns>standing head turns), add brandt daroff habituation, work on gaze as tolerated in clinic with cues,  Consider BBQ roll for canolith repositioning to R if symptoms remain after habituation (responded well to habituation in clinic and may not need). Convergence-- give for HEP (only provoking mild sxs); core/hip strengthening; trial of extension for back pain;   Gerrick Ray, PT 08/18/24 10:23 AM

## 2024-08-19 ENCOUNTER — Ambulatory Visit: Admitting: Professional

## 2024-08-19 ENCOUNTER — Other Ambulatory Visit: Payer: Self-pay | Admitting: Family Medicine

## 2024-08-20 ENCOUNTER — Encounter

## 2024-08-22 DIAGNOSIS — R339 Retention of urine, unspecified: Secondary | ICD-10-CM | POA: Diagnosis not present

## 2024-08-22 DIAGNOSIS — N2581 Secondary hyperparathyroidism of renal origin: Secondary | ICD-10-CM | POA: Diagnosis not present

## 2024-08-22 DIAGNOSIS — N1831 Chronic kidney disease, stage 3a: Secondary | ICD-10-CM | POA: Diagnosis not present

## 2024-08-22 DIAGNOSIS — E785 Hyperlipidemia, unspecified: Secondary | ICD-10-CM | POA: Diagnosis not present

## 2024-08-22 DIAGNOSIS — I129 Hypertensive chronic kidney disease with stage 1 through stage 4 chronic kidney disease, or unspecified chronic kidney disease: Secondary | ICD-10-CM | POA: Diagnosis not present

## 2024-08-22 DIAGNOSIS — D631 Anemia in chronic kidney disease: Secondary | ICD-10-CM | POA: Diagnosis not present

## 2024-08-22 DIAGNOSIS — N183 Chronic kidney disease, stage 3 unspecified: Secondary | ICD-10-CM | POA: Diagnosis not present

## 2024-08-26 ENCOUNTER — Ambulatory Visit: Payer: Self-pay

## 2024-08-26 ENCOUNTER — Ambulatory Visit: Attending: Physician Assistant

## 2024-08-26 ENCOUNTER — Other Ambulatory Visit: Payer: Self-pay | Admitting: Family Medicine

## 2024-08-26 DIAGNOSIS — R2681 Unsteadiness on feet: Secondary | ICD-10-CM | POA: Diagnosis not present

## 2024-08-26 DIAGNOSIS — M25572 Pain in left ankle and joints of left foot: Secondary | ICD-10-CM | POA: Diagnosis not present

## 2024-08-26 DIAGNOSIS — M5416 Radiculopathy, lumbar region: Secondary | ICD-10-CM | POA: Diagnosis not present

## 2024-08-26 DIAGNOSIS — R2689 Other abnormalities of gait and mobility: Secondary | ICD-10-CM | POA: Diagnosis not present

## 2024-08-26 DIAGNOSIS — M6281 Muscle weakness (generalized): Secondary | ICD-10-CM | POA: Diagnosis not present

## 2024-08-26 NOTE — Telephone Encounter (Signed)
 Called patient and she was scheduled for appointment on tomorrow, 08/27/24 @ 1:00 PM w/ Harlene Wheeler [facility]  Also she wanted Dr. Domenica to know she concerned of left breast cancer because she having the same drainage like right breast.

## 2024-08-26 NOTE — Telephone Encounter (Signed)
 Rx for fluoxetine  20mg  denied. No longer on med list.

## 2024-08-26 NOTE — Telephone Encounter (Addendum)
 Attempted to contact pt 3x, no contact made. LVMTCB x3. Routing to clinic.   Summery:  Patient has breast cancer and is having drainage in the opposite breast she has the cancer in. (There is soreness and tenderness but she said that with her health that's normal for her.)

## 2024-08-26 NOTE — Therapy (Signed)
 OUTPATIENT PHYSICAL THERAPY TREATMENT    Patient Name: TIKESHA MORT MRN: [id number] DOB:[birth year], 78 y.o., female Today's Date: 08/26/2024  END OF SESSION:  PT End of Session - 08/26/24 1359     Visit Number 21    Number of Visits 28    Date for PT Re-Evaluation 09/20/24    Authorization Type [employer]    Progress Note Due on Visit 30    PT Start Time 1400    PT Stop Time 1438    PT Time Calculation (min) 38 min    Activity Tolerance Patient tolerated treatment well    Behavior During Therapy [facility] for tasks assessed/performed          Past Medical History:  Diagnosis Date   Abdominal pain 10/26/2013   Allergic rhinitis 01/25/2016   ANA positive 07/29/2013   Anemia    Arthralgia 08/18/2013   Arthritis    Asthma, allergic 08/05/2012   Atrial flutter    past history- not current   Atypical chest pain 01/01/2015   Bilateral carpal tunnel syndrome 03/04/2015   CAD in native artery 08/25/2010   Minor CAD 2009, 2011, low risk Myoview  2013 and 2016        Cataract    bilaterally removed    Cellulitis of breast 02/05/2023   Chronic constipation    Chronic cough 01/09/2017   Chronic interstitial cystitis 02/01/2017   Chronic night sweats 01/08/2015   Common bile duct dilatation 02/13/2023   Concussion    x 3   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   DDD (degenerative disc disease), cervical 09/17/2018   Degenerative scoliosis 04/22/2018   Deviated septum 05/12/2016   Dysphagia    Dyspnea    Dysuria 03/09/2021   Elevated alkaline phosphatase level 01/25/2023   Essential hypertension 08/25/2010   External hemorrhoids 02/05/2015   Facial trauma 01/22/2023   Family history of adverse reaction to anesthesia    Family history of malignant hyperthermia    father had this   Fibrocystic breast disease 10/18/2010   Fibromyalgia    Frequency of urination    Generalized anxiety disorder 08/03/2010   GERD (gastroesophageal reflux disease)    Headache 10/13/2013    High serum vitamin D  05/18/2021   History of chronic bronchitis    History of colonic polyp 02/25/2019   History of hiatal hernia    History of tachycardia    CONTROLLED  WITH ATENOLOL    Hyperglycemia 03/08/2021   Hyperkalemia 09/28/2022   Hyperlipidemia    Intertrigo 12/13/2022   Kidney stone 02/26/2019   Knee pain, right 04/30/2015   Left hip pain 04/30/2015   Leg pain 02/24/2011   Qualifier: Diagnosis of   By: Antonio DOJamee         Leukocytosis 07/05/2022   Local reaction to hymenoptera sting 05/09/2023   Low back pain 09/08/2020   Major depressive disorder 02/05/2013   Malignant neoplasm of lower-outer quadrant of right breast of female, estrogen receptor positive 01/03/2013   Nasal turbinate hypertrophy 05/12/2016   Oral herpes 02/05/2023   OSA and COPD overlap syndrome 06/10/2020   Osteoporosis 01/2019   T score -2.2 stable/improved from prior study   Pelvic pain    Peripheral neuropathy 05/22/2014   Personal history of radiation therapy    Pneumonia    Primary insomnia 06/25/2017   Pulmonary nodules 02/12/2015   Rash and other nonspecific skin eruption 05/09/2023   Recurrent falls 05/02/2020   Recurrent sinusitis 02/25/2019   Right arm pain 07/20/2016  RLS (restless legs syndrome) 11/09/2019   RUQ pain 07/05/2022   S/P radiation therapy 11/12/12 - 12/05/12   right Breast   Sciatica 04/30/2015   Sepsis 2014   from UTI    Sjogren's disease 12/12/2021   Splenic lesion 09/02/2012   Sprain of lateral ligament of ankle joint 07/31/2023   Stage 3 chronic kidney disease    Statin intolerance 02/29/2020   Stricture and stenosis of esophagus 10/19/2011   Tongue lesion 02/05/2023   Tremor 07/05/2022   Urgency of urination    Vertigo 01/22/2023   Vitamin B12 deficiency 03/08/2021   Vocal cord cyst 05/18/2021   Past Surgical History:  Procedure Laterality Date   24 HOUR PH STUDY N/A 04/03/2016   Procedure: 24 HOUR PH STUDY;  Surgeon: Elspeth Deward Naval,  MD;  Location: [facility];  Service: Gastroenterology;  Laterality: N/A;   ANKLE ARTHROSCOPY WITH RECONSTRUCTION Left 03/12/2024   Procedure: ANKLE ARTHROSCOPY WITH OPEN LATERAL ANKLE LIGAMENT STABILIZATION;  Surgeon: Barton Drape, MD;  Location: [facility];  Service: Orthopedics;  Laterality: Left;   BREAST BIOPSY Right 08/23/2012   ADH   BREAST BIOPSY Right 10/11/2012   Ductal Carcinoma   BREAST EXCISIONAL BIOPSY Right 09/04/2013   benign   BREAST LUMPECTOMY Right 10/11/2012   W/ SLN BX   CARDIAC CATHETERIZATION  09-13-2007  DR MORRIS   WELL-PRESERVED LVF/  DIFFUSE SCATTERED CORONARY CALCIFACATION AND ATHEROSCLEROSIS WITHOUT OBSTRUCTION   CARDIAC CATHETERIZATION  08-04-2010  DR MCALHANY   NON-OBSTRUCTIVE CAD/  pLAD 40%/  oLAD 30%/  mLAD 30%/  pRCA 30%/  EF 60%   CARDIOVASCULAR STRESS TEST  06-18-2012  DR McALHANY   LOW RISK NUCLEAR STUDY/  SMALL FIXED AREA OF MODERATELY DECREASED UPTAKE IN ANTEROSEPTAL WALL WHICH MAY BE ARTIFACTUAL/  NO ISCHEMIA/  EF 68%   COLONOSCOPY  09/29/2010   CYSTOSCOPY     CYSTOSCOPY WITH HYDRODISTENSION AND BIOPSY N/A 03/06/2014   Procedure: CYSTOSCOPY/HYDRODISTENSION/ INSTILATION OF MARCAINE  AND PYRIDIUM ;  Surgeon: Arlena LILLETTE Gal, MD;  Location: [facility];  Service: Urology;  Laterality: N/A;   CYSTOSCOPY/URETEROSCOPY/HOLMIUM LASER/STENT PLACEMENT Left 03/21/2023   Procedure: CYSTOSCOPY LEFT RETROGRADE PYELOGRAM, LEFT URETEROSCOPY, HOLMIUM LASER LITHOTRIPSYM, AND LEFT URETERAL STENT PLACEMENT;  Surgeon: Devere Lonni Righter, MD;  Location: [facility];  Service: Urology;  Laterality: Left;  60 MINUTES   ESOPHAGEAL MANOMETRY N/A 04/03/2016   Procedure: ESOPHAGEAL MANOMETRY (EM);  Surgeon: Elspeth Deward Naval, MD;  Location: [facility];  Service: Gastroenterology;  Laterality: N/A;   EXTRACORPOREAL SHOCK WAVE LITHOTRIPSY Left 02/06/2019   Procedure: EXTRACORPOREAL SHOCK WAVE LITHOTRIPSY (ESWL);  Surgeon: Ottelin,  Mark, MD;  Location: [facility];  Service: Urology;  Laterality: Left;   NASAL SINUS SURGERY  1985   ORIF RIGHT ANKLE  FX  2006   POLYPECTOMY     REMOVAL VOCAL CORD CYST  01/2013   REPAIR OF PERONEUS BREVIS TENDON Left 03/12/2024   Procedure: REPAIR OF PERONEUS TENDON;  Surgeon: Barton Drape, MD;  Location: [facility];  Service: Orthopedics;  Laterality: Left;   RIGHT BREAST BX  08/23/2012   RIGHT HAND SURGERY  X3  LAST ONE 2009   INCLUDES  ORIF RIGHT 5TH FINGER AND REVISION TWICE   SKIN CANCER EXCISION     face,arms   TONSILLECTOMY AND ADENOIDECTOMY  AGE 47   TOTAL ABDOMINAL HYSTERECTOMY W/ BILATERAL SALPINGOOPHORECTOMY  1982   W/  APPENDECTOMY   TRANSTHORACIC ECHOCARDIOGRAM  06/24/2012   GRADE I DIASTOLIC DYSFUNCTION/  EF 55-60%/  MILD MR   UPPER GASTROINTESTINAL ENDOSCOPY     Patient Active Problem List   Diagnosis Date Noted   Acute left ankle pain 08/14/2024   Pedal edema 05/20/2024   Otalgia of both ears 04/23/2024   Tinnitus of both ears 04/23/2024   Hypokalemia 12/10/2023   IDA (iron deficiency anemia) 09/12/2023   Stage 3 chronic kidney disease    Right ankle pain 07/31/2023   Common bile duct dilatation 02/13/2023   Oral herpes 02/05/2023   Elevated alkaline phosphatase level 01/25/2023   Vertigo 01/22/2023   Intertrigo 12/13/2022   Hyperkalemia 09/28/2022   Anemia 09/28/2022   Tremor 07/05/2022   RUQ pain 07/05/2022   Leukocytosis 07/05/2022   Sjogren's disease 12/12/2021   Vocal cord cyst 05/18/2021   Vitamin D  deficiency 05/18/2021   Dysuria 03/09/2021   Vitamin B12 deficiency 03/08/2021   Hyperglycemia 03/08/2021   OSA and COPD overlap syndrome 06/10/2020   Recurrent falls 05/02/2020   Statin intolerance 02/29/2020   RLS (restless legs syndrome) 11/09/2019   Recurrent sinusitis 02/25/2019   History of colonic polyp 02/25/2019   DDD (degenerative disc disease), cervical 09/17/2018   Degenerative scoliosis 04/22/2018   Primary insomnia  06/25/2017   Chronic interstitial cystitis 02/01/2017   Deviated nasal septum 05/12/2016   Hypertrophy of nasal turbinates 05/12/2016   Dysphagia    Allergic rhinitis 01/25/2016   Dyspnea    Sciatica 04/30/2015   Bilateral carpal tunnel syndrome 03/04/2015   Hyperlipidemia 03/01/2015   Pulmonary nodules 02/12/2015   External hemorrhoids 02/05/2015   Chronic night sweats 01/08/2015   Peripheral neuropathy 05/22/2014   Headache 10/13/2013   Arthralgia 08/18/2013   ANA positive 07/29/2013   Depression with anxiety 02/05/2013   Malignant neoplasm of lower-outer quadrant of right breast of female, estrogen receptor positive 01/03/2013   Asthma, allergic 08/05/2012   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   GERD (gastroesophageal reflux disease) 06/03/2012   Stricture and stenosis of esophagus 10/19/2011   Osteoporosis 04/17/2011   Fibrocystic breast disease 10/18/2010   Essential hypertension 08/25/2010   CAD in native artery 08/25/2010   Generalized anxiety disorder 08/03/2010   Bronchitis 03/10/2010   Fibromyalgia 01/11/2010   PCP: Domenica Harlene LABOR, MD  REFERRING PROVIDER: Barton Drape, MD (peroneal tendinitis)  Bonner Ade, MD (radiculopathy)   REFERRING DIAG: M76.72 (ICD-10-CM) - Peroneal tendinitis, left leg   M54.16 (ICD-10-CM) - Radiculopathy, lumbar region   THERAPY DIAG:  Muscle weakness (generalized)  Other abnormalities of gait and mobility  Unsteadiness on feet  Radiculopathy, lumbar region  Rationale for Evaluation and Treatment: Rehabilitation  ONSET DATE: 03/12/24  SUBJECTIVE:  SUBJECTIVE STATEMENT: Patient reports her back continues to hurt. Has not had much dizziness at all over the past week and feels the exercises really help. The foot is doing a lot better. She saw her kidney doctor on Friday and thinks she could have a kidney infection, but is awaiting results. She reports increased urinary frequency and burning. She is also awaiting phone call  from PCP regarding nipple discharge.   PERTINENT HISTORY: S/p Lt ankle arthroscopy Brostrom stabilization, peroneus brevis and longus debridement and tenosynovectomy 03/12/24 See extensive PMH above   PAIN:  Are you having pain? Yes: NPRS scale: 7/10 Pain location:  low/mid back Pain description: sharp Aggravating factors: walking, standing Relieving factors: laying on left side  PRECAUTIONS: Fall,progress per protocol; osteoporosis   WEIGHT BEARING RESTRICTIONS: No  FALLS:  Has patient fallen in last 6 months? Yes. Number of falls 2 (fell stepping  up grassy hill, fell in her kitchen from turning quickly)  LIVING ENVIRONMENT: Lives with: lives with their spouse Lives in: House/apartment Stairs: Yes: Internal: 16 steps; on left going up Has following equipment at home: Single point cane  PATIENT GOALS: I want to be able to walk again.  NEXT MD VISIT: November 2025   OBJECTIVE:  Note: Objective measures were completed at Evaluation unless otherwise noted.  DIAGNOSTIC FINDINGS: none on file   PATIENT SURVEYS:  FADI: 46/104; 44% 06/26/24: FADI: 67/100; 67%  08/11/24: modified oswestry 30/50; 60% disability   COGNITION: Overall cognitive status: Within functional limits for tasks assessed     SENSATION: See palpation  EDEMA:  Mild swelling about Lt ankle   PALPATION: Hypersensitivity to palpation about distal fibula, lateral malleolus  LUMBAR ROM:   Active  A/PROM  eval  Flexion 50% limited LBP and RLE pain   Extension WNL LBP  Right lateral flexion 25% limited  Left lateral flexion 25% limited   Right rotation WNL  Left rotation WNL   (Blank rows = not tested)  LOWER EXTREMITY ROM: Active ROM Right eval Left eval 05/05/24 Left  05/20/24 Left  06/03/24 Left   Hip flexion       Hip extension       Hip abduction       Hip adduction       Hip internal rotation       Hip external rotation       Knee flexion       Knee extension       Ankle dorsiflexion   6 9  10   Ankle plantarflexion  50  65 67  Ankle inversion  15  15 17   Ankle eversion  8  8 9    (Blank rows = not tested)  LOWER EXTREMITY MMT: MMT Right eval Left eval 06/26/24 Left  08/04/24 Left  08/11/24   Hip flexion     Rt: 4; Lt: 4-  Hip extension       Hip abduction     4 bilateral   Hip adduction       Hip internal rotation       Hip external rotation       Knee flexion       Knee extension       Ankle dorsiflexion  3+ 4+ 4+   Ankle plantarflexion  3+ sitting 4- in sitting 4 in sitting    Ankle inversion  3+ 4- 4   Ankle eversion  3+ 4 5    (Blank rows = not tested)  FUNCTIONAL TESTS:  TUG: 13.5 seconds Romberg x 30 seconds Semi-tandem unable   06/09/24: TUG: 11.1 seconds   06/26/24: tandem 1 second   08/11/24: Rt rolling requires use of UE support on the hip to roll, increased pain   GAIT: Distance walked: 20 ft  Assistive device utilized: Single point cane. Ankle brace Level of assistance: Modified independence Comments: limited push-off LLE, foot ER,  [facility] Adult PT Treatment:                                                DATE: 08/26/24 Therapeutic Exercise: Reviewed HEP, recommended to d/c core strengthening until f/u regarding kidney testing   Therapeutic Activity: VOR x 1 with checkered background, seated 90 BPM; 5 trials 20-30 sec  Habituation: nose to  knee x 5 each  Seated ball toss 2 x 10  Self Care: F/u with nephrology regarding results and worsening symptoms   [facility] Adult PT Treatment:                                                DATE: 08/18/24 Therapeutic Exercise: Supine Bent knee fallouts 5 reps R and L sides Standing  lumbar extension at countertop x 5 reps within tolerable range of motion lumbar flexion at countertop with cues to stay within tolerable range Hip adduction stretch Manual Therapy: Self massage with deflated pilates ball on sacrum in supine Self massage trying massage ball against wall on R glut-- too painful and not able to  tolerate IASTM glut med and R sacral ligaments with some discomfort-- performed to tolerance Neuromuscular re-ed: TA brace in hooklying x 10 reps x 5 second holds-- initially had muscle spasm, but improved  Therapeutic Activity: Backwards walking for glutes firing 15 feet x 3 reps with SBA   [facility] Adult PT Treatment:                                                DATE: 08/13/24 Neuromuscular Re-education: TA brace in hooklying x 10; 5 sec hold Posterior pelvic tilts d/c due to back pain  Hooklying hip abduction green band x 10  Isometric hip adduction x 10 Gait training: With RW working on heel strike, increasing stance time on RLE, and knee/hip flexion Modalities: Game ready (VASO) x 10 minutes low compression; 34 degrees; Lt ankle.  Self Care: - Recommended f/u with urgent care or surgeon if pain worsens or continues over next 1-2 days.  -Use RW currently due to pain.  - Ice for pain control    [facility] Adult PT Treatment:                                                DATE: 08/11/24 Neuromuscular re-ed: Supine posterior pelvic tilt x 10  Resisted hooklying hip abduction red band x 10  Hooklying hip adduction isometric ball squeeze x 10  Self Care: Bed mobility and sleep positioning Posture    [facility] Adult PT Treatment:                                                DATE: 08/06/24 Therapeutic Exercise: Calf stretch on wedge x 30 sec  Gastroc stretch at wall x 30 sec each Reviewed and updated HEP  Neuromuscular re-ed: Romberg EC  2 x 20 sec, 1 x 30 sec  Romberg with horizontal head turns x 10  VOR x 1 horizontal/vertical x 30 sec, normal stance  VOR x 1 horizontal 2 x 10 second, romberg dizzy 6/10  Standing ball toss to self x 10     [facility] Adult PT Treatment:  DATE: 08/04/24 Therapeutic Exercise: Eccentric calf raise 2 x 10  Reviewed and updated HEP Neuromuscular re-ed: Semi-tandem 2 x 30 sec each Romberg x 30 sec Romberg EC x  3 seconds Normal stance EC x 30 seconds Narrow stance EC x 19 seconds  Therapeutic Activity: Re-assessment to determine overall progress, educating patient on progress towards goals   Self Care: Recommended seeking care for what patient's believe is a sinus headache     PATIENT EDUCATION:  Education details:see treatment  Person educated: Patient Education method: Explanation,  Education comprehension: verbalized understanding  HOME EXERCISE PROGRAM: Access Code: [email or web address] URL: [email or web address] Date: 08/13/2024 Prepared by: Lucie Meeter  Exercises - Seated Plantar Fascia Mobilization with Small Ball  - 1 x daily - 7 x weekly - 3-5 minute  hold - Seated Great Toe Extension  - 1 x daily - 7 x weekly - 2 sets - 10 reps - Long Sitting Ankle Plantar Flexion with Resistance  - 1 x daily - 7 x weekly - 2 sets - 10 reps - Long Sitting Ankle Inversion with Resistance  - 1 x daily - 7 x weekly - 2 sets - 10 reps - Toe Raises with Counter Support  - 1 x daily - 7 x weekly - 2 sets - 10 reps - Heel Raises with Counter Support  - 1 x daily - 7 x weekly - 2 sets - 10 reps - Towel Scrunches  - 1 x daily - 7 x weekly - 1 sets - 5 reps - Rolling rightleft sides for vestibular habituation  - 1-2 x daily - 7 x weekly - 1 sets - 10 reps - Seated Right Head Turns Vestibular Habituation  - 1-2 x daily - 7 x weekly - 1 sets - 5 reps - Standing Gaze Stabilization with Head Rotation  - 1 x daily - 7 x weekly - 2 sets - 10 reps - Standing Hip Abduction with Counter Support  - 1 x daily - 7 x weekly - 2 sets - 10 reps - Standing Romberg to 3/4 Tandem Stance  - 1 x daily - 7 x weekly - 3 sets - max  hold - Gastroc Stretch on Wall  - 1 x daily - 7 x weekly - 3 sets - 30 sec  hold - Hooklying Clamshell with Resistance  - 1 x daily - 7 x weekly - 2 sets - 10 reps - Supine Hip Adduction Isometric with Ball  - 1 x daily - 7 x weekly - 2 sets - 10 reps - Supine Transversus Abdominis Bracing -  Hands on Stomach  - 1 x daily - 7 x weekly - 2 sets - 10 reps - 5 sec  hold  ASSESSMENT:  CLINICAL IMPRESSION: Patient is awaiting lab results after recent visit with nephrologist who believes that she could have a kidney infection. Was recommended to reach back out to them to see if results are available and discuss worsening symptoms (increased urinary frequency and burning) with patient verbalizing understanding. It was recommended that she discontinue core strengthening until kidney results have been obtained. Focused on vestibular progression increasing speed and challenging background with VOR training. She had initially dizziness on first set, but no onset of dizziness with remaining 4 sets. No dizziness with habituation activity noted today. Overall tolerated session well today without an increase in back pain.   EVAL: Patient is a 78 y.o. female who was seen today for physical therapy evaluation and treatment for s/p Lt ankle arthroscopy  Brostrom stabilization, peroneus brevis and longus debridement and tenosynovectomy on 03/12/24. She demonstrates ROM, strength, gait and balance deficits that are consistent with her recent post-operative status. She will benefit from skilled PT to address the above stated deficits in order to return to optimal function.   OBJECTIVE IMPAIRMENTS: Abnormal gait, decreased activity tolerance, decreased balance, decreased endurance, decreased knowledge of condition, decreased mobility, difficulty walking, decreased ROM, decreased strength, increased edema, impaired flexibility, impaired sensation, improper body mechanics, postural dysfunction, and pain.   GOALS: Goals reviewed with patient? Yes  SHORT TERM GOALS: Target date: 06/09/2024  Patient will be independent and compliant with initial HEP.  Baseline: issued at eval  Goal status: MET  2.  Patient will demonstrate at least 60 degrees of Lt ankle plantarflexion AROM to improve push-off during gait cycle.   Baseline: see above Goal status: MET  3.  Patient will demonstrate at least 10 degrees of Lt ankle dorsiflexion AROM to improve gait mechanics.  Baseline: see above Goal status: MET  4.  Patient will complete TUG in </= 12 seconds to reduce fall risk.  Baseline: see above Goal status: MET  LONG TERM GOALS: Target date: 09/20/24  Patient will score >/= 60/104 on the Foot and Ankle Disability Index (MDC 7) to signify clinically meaningful improvement in functional abilities.  Baseline: see above Goal status: MET  2.  Patient will maintain tandem stance for at least 10 seconds to improve gait stability.  Baseline: see above 06/26/24: tandem 1 second 08/04/24: tandem 4 second Goal status: progressing   3.  Patient will be modified independent with curb/stair negotiation.  Baseline: uses cane and requires support from spouse  06/26/24: requires cane  Goal status: MET  4.  Patient will self-report ability to tolerate at least 30 minutes of standing/walking activity.  Baseline: 10 minutes  06/26/24: 20 minutes  08/04/24: limited due to back pain and dizziness, 10 minutes  Goal status: progressing   5.  Patient will demonstrate at least 4/5 Lt ankle strength to improve stability when navigating on uneven terrain.  Baseline: see above  Goal status: MET  6.  Patient will be independent with advanced home program to progress/maintain current level of function.  Baseline: initial HEP issued  Goal status: progressing    7. Patient will tolerate rolling R and L without d/o dizziness.   Baseline: 4/10 dizzines 08/04/24: 4/10 dizziness baseline    Goal status: deferred due to 4/10 baseline dizzines     8. Patient will tolerate gaze x 1 viewing in sitting x 20 seconds to work towards improved tolerance to turns in standing.   Baseline: Unable to do 2 reps in sitting   08/04/24: VOR x 1, 20 seconds    Goal status: MET   9. Patient will score </= 40 % disability on the Oswestry Disability  Index (MCID is 20%) to signify clinically meaningful improvement in functional abilities.    Baseline: see above   Goal status: NEW  10. Patient will demonstrate proper lifting mechanics to improve her tolerance to loading/unloading the dishwasher.    Baseline: increased pain, limits bending activity   Goal status: NEW  PLAN:  PT FREQUENCY: 2x/week  PT DURATION: 6 weeks  PLANNED INTERVENTIONS: 97164- PT Re-evaluation, 97750- Physical Performance Testing, 97110-Therapeutic exercises, 97530- Therapeutic activity, [id number]- Neuromuscular re-education, 97535- Self Care, [id number]- Manual therapy, [id number]- Gait training, [id number]- Aquatic Therapy, [id number]- Vasopneumatic device, Taping, Dry Needling, Cryotherapy, and Moist heat  PLAN FOR NEXT SESSION: review and progress HEP; ankle  strengthening; static balance. For vestibular: Progress habituation (seated head turns>standing head turns), add brandt daroff habituation, work on gaze as tolerated in clinic with cues,  Consider BBQ roll for canolith repositioning to R if symptoms remain after habituation (responded well to habituation in clinic and may not need). Convergence-- give for HEP (only provoking mild sxs); core/hip strengthening; trial of extension for back pain; kidney results?  Maricia Scotti, PT, DPT, ATC 08/26/24 2:43 PM

## 2024-08-27 ENCOUNTER — Ambulatory Visit: Admitting: Student

## 2024-08-27 ENCOUNTER — Encounter: Payer: Self-pay | Admitting: Student

## 2024-08-27 VITALS — BP 113/72 | HR 71 | Ht 65.5 in | Wt 128.8 lb

## 2024-08-27 DIAGNOSIS — N3010 Interstitial cystitis (chronic) without hematuria: Secondary | ICD-10-CM

## 2024-08-27 DIAGNOSIS — N6452 Nipple discharge: Secondary | ICD-10-CM | POA: Diagnosis not present

## 2024-08-27 DIAGNOSIS — R300 Dysuria: Secondary | ICD-10-CM

## 2024-08-27 DIAGNOSIS — N6019 Diffuse cystic mastopathy of unspecified breast: Secondary | ICD-10-CM | POA: Diagnosis not present

## 2024-08-27 NOTE — Assessment & Plan Note (Addendum)
 PMHx  breast cancer. Breast exam today is WNLs, no acute abnormalities.  TWP b/l breasts during exam, which she states has been a chronic issue. Advised to continue monitoring at home with regular self-breast exams and to maintain annual mammograms as recommended.

## 2024-08-27 NOTE — Assessment & Plan Note (Addendum)
 Symptomatic today-UA and urine culture pending.  Will treat based on results.  Ensure adequate hydration.  Follow-up with urology.

## 2024-08-27 NOTE — Progress Notes (Unsigned)
 Acute Office Visit  Subjective:     Patient ID: [name], female    DOB: 05-21-1946, 78 y.o.   MRN: [id number]  Chief Complaint  Patient presents with   left breast drainge    Noticed it a couple weeks ago.      HPI Patient is in today for breast exam. She has had discharge from left breast, onset 2 weeks ago. Had radioaction and lumpectomy on right side.   PMHx- right breast cancer, fibrocystic breast disease, chronic bb/l breast pain  The PET scan was done on 01/21/2024. The PET scan WNL. 3D B/L Mammogram 02/2024- WNL Has a Hx of chronic pain in b/l breasts.  Patient presents with complaints of breast discharge for the past 2-3 weeks. Reports minimal drainage, white/clear and notes occasionally seeing small spots on her clothing, though she does not always observe fluid actively leaking from the breast. Reports Hx of chronic b/l breast pain, TWP.  Denies palpable lumps, redness, swelling, skin changes, or nipple inversion. Denies fever, chills, or recent infection.  Patient presents with additional c/o patient urinary frequency, dysuria, and hesitancy. Denies hematuria, flank pain, fever, chills, lower abdominal pain, nausea, or vomiting.   Patient denies fever, chills, SOB, CP, palpitations, dyspnea, edema, HA, vision changes, N/V/D, abdominal pain,  rash, weight changes, and recent illness or hospitalizations.   ROS See HPI      Objective:    BP 113/72   Pulse 71   Ht 5' 5.5 (1.664 m)   Wt 128 lb 12.8 oz (58.4 kg)   BMI 21.11 kg/m  {Vitals History (Optional):23777}  Physical Exam  Physical Exam Vitals reviewed.  HENT:     Head: Normocephalic and atraumatic.  Eyes:     Pupils: Pupils are equal, round, and reactive to light.  Cardiovascular:     Rate and Rhythm: Normal rate and regular rhythm.     Heart sounds: Normal heart sounds.  Pulmonary:     Effort: Pulmonary effort is normal.     Breath sounds: Normal breath sounds.  Abdominal:     General:  Bowel sounds are normal.     Palpations: Abdomen is soft.  Musculoskeletal:        General: No tenderness or deformity. Normal range of motion.     Cervical back: Normal range of motion.  Lymphadenopathy:     Cervical: No cervical adenopathy.  Skin:    General: Skin is warm and dry.     Findings: No erythema or rash.  Breasts symmetrical with no visible skin changes, dimpling, or nipple discharge. Right nipple absent. No palpable masses or lumps noted bilaterally. Mild/ moderate generalized tenderness present with palpation b/l breast (per Pt this is chronic tenderness). No axillary lymphadenopathy detected. Neurological:     Mental Status: She is alert and oriented to person, place, and time.  GU:  No CVA tenderness Psychiatric:        Behavior: Behavior normal.        Thought Content: Thought content normal.        Judgment: Judgment normal    No results found for any visits on 08/27/24.      Assessment & Plan:   Problem List Items Addressed This Visit     Breast discharge   Breast exam today is within normal limits with no discharge noted. Patient had a bilateral 3D mammogram in March 2025, which was WNL.  Will continue to monitor at this time. Reviewed red flag symptoms and advised the  patient to return to the clinic if symptoms persist or worsen.       Chronic interstitial cystitis   Symptomatic today-UA and urine culture pending.  Will treat based on results.  Ensure adequate hydration.  Follow-up with urology.      Dysuria   Relevant Orders   POCT Urinalysis Dipstick (Automated)   Urine Culture   Fibrocystic breast disease - Primary   PMHx  breast cancer. Breast exam today is WNLs, no acute abnormalities.  TWP b/l breasts during exam, which she states has been a chronic issue. Advised to continue monitoring at home with regular self-breast exams and to maintain annual mammograms as recommended.      FU 6 weeks Portions of this note were dictated using DRAGON  voice recognition software. Please disregard any errors in transcription.     No orders of the defined types were placed in this encounter.   Return in about 6 weeks (around 10/08/2024), or follow up chornic conditions.  Aerial Dilley L Nivea Wojdyla, NP

## 2024-08-27 NOTE — Assessment & Plan Note (Signed)
 Breast exam today is within normal limits with no discharge noted. Patient had a bilateral 3D mammogram in March 2025, which was WNL.  Will continue to monitor at this time. Reviewed red flag symptoms and advised the patient to return to the clinic if symptoms persist or worsen.

## 2024-08-28 ENCOUNTER — Encounter

## 2024-08-28 LAB — URINE CULTURE
MICRO NUMBER:: 16917050
Result:: NO GROWTH
SPECIMEN QUALITY:: ADEQUATE

## 2024-08-29 ENCOUNTER — Ambulatory Visit: Payer: Self-pay | Admitting: Student

## 2024-08-29 DIAGNOSIS — M549 Dorsalgia, unspecified: Secondary | ICD-10-CM | POA: Diagnosis not present

## 2024-08-29 DIAGNOSIS — R300 Dysuria: Secondary | ICD-10-CM | POA: Diagnosis not present

## 2024-08-29 DIAGNOSIS — N3020 Other chronic cystitis without hematuria: Secondary | ICD-10-CM | POA: Diagnosis not present

## 2024-08-29 DIAGNOSIS — R8279 Other abnormal findings on microbiological examination of urine: Secondary | ICD-10-CM | POA: Diagnosis not present

## 2024-09-01 ENCOUNTER — Encounter: Payer: Self-pay | Admitting: Professional

## 2024-09-01 ENCOUNTER — Ambulatory Visit (INDEPENDENT_AMBULATORY_CARE_PROVIDER_SITE_OTHER): Admitting: Professional

## 2024-09-01 ENCOUNTER — Ambulatory Visit

## 2024-09-01 DIAGNOSIS — F411 Generalized anxiety disorder: Secondary | ICD-10-CM

## 2024-09-01 DIAGNOSIS — F4310 Post-traumatic stress disorder, unspecified: Secondary | ICD-10-CM | POA: Diagnosis not present

## 2024-09-01 DIAGNOSIS — R2681 Unsteadiness on feet: Secondary | ICD-10-CM

## 2024-09-01 DIAGNOSIS — R2689 Other abnormalities of gait and mobility: Secondary | ICD-10-CM

## 2024-09-01 DIAGNOSIS — M6281 Muscle weakness (generalized): Secondary | ICD-10-CM

## 2024-09-01 DIAGNOSIS — M5416 Radiculopathy, lumbar region: Secondary | ICD-10-CM

## 2024-09-01 DIAGNOSIS — M25572 Pain in left ankle and joints of left foot: Secondary | ICD-10-CM

## 2024-09-01 DIAGNOSIS — F331 Major depressive disorder, recurrent, moderate: Secondary | ICD-10-CM

## 2024-09-01 NOTE — Progress Notes (Signed)
 [facility] Counselor/Therapist Progress Note  Patient ID: LAKERA VIALL, MRN: [id number],    Date: 09/01/2024  Time Spent: 54 minutes 12-1254pm   Treatment Type: Individual Therapy  Risk Assessment: Danger to Self:  No Self-injurious Behavior: No Danger to Others: No  Subjective: This session was held via video teletherapy. The patient consented to video teletherapy and was located in her home during this session. She is aware it is the responsibility of the patient to secure confidentiality on her end of the session. The provider was in a private home office for the duration of this session.    The patient arrived on time for the [name] appointment.   Issues addressed: 1-mood -struggling due to health issues for the past several weeks -is not as down this week as last 2-physical -still in treatment -has struggled a little since our last session but is feeling better -reading pamphlets on kidney disease and when kidney transplant is needed -she then got a urinary tract infection 3-issues with sister Dagoberto -I'm so mad at my sister -she will spent 1-2k and simply states she will make it up next time -goes with a childhood girlfriend Lauraine pt also knows -sister has never had anything wrong -sister called while in car on way home with Lauraine present -sister must have given details about how the pt has been -pt is angry that Dagoberto shared her business and left for that reason -pt thinks Dagoberto was being malicious -her sister always saw her as competition -pt noticed she was feeling tight in her chest 4-social -pt tends to be socially isolated -she [facility] -pt stopped attending due to the whining -discussed support group opportunities including online book club, interstitial cystitis (IC) -pt makes phone calls to shut ins 5-daughter Bari -calling pt 1-2 times per day -she is really struggling with her daughter  Lamarr going away to [employer] -pt thinks that [place] caught on a long time before that I was a whining grandma 6-self-care -upset with her own negativity -reminded pt to think before speaking  Treatment Plan Problems: anxiety, depression, family conflict Goals: Alleviate depressive symptoms Recognize, accept, and cope with depressive feelings Develop healthy thinking patterns Develop healthy interpersonal relationships (particularly younger daughter) Reduce overall frequency, intensity, and duration of anxiety Stabilize anxiety level while increasing ability to function Enhance ability to effectively cope with full variety of stressors Learn and implement coping skills that result in a reduction of anxiety   Objectives target date for all objectives is 09/26/2024: Verbalize an understanding of the cognitive, physiological, and behavioral components of anxiety                               60     75 Learning and implement calming skills to reduce overall anxiety                       60     75 Verbalize an understanding of the role that cognitive biases play in excessive irrational worry and persistent anxiety symptoms 70     85+ Identify, challenge, and replace biased fearful self-talk  60     80+ Learn and implement problem solving strategies   70     95 Identify and engage in pleasant activities    80     90 Learning and implement personal and interpersonal skills to reduce anxiety and improve interpersonal  relationships   60     85 Learn to accept limitations in life and commit to tolerating, rather than avoiding, unpleasant emotions while accomplishing meaningful goal 60 85 Identify major life conflicts from the past and present that form the basis for present anxiety      70 90+     Maintain involvement in work, family, and social activities 70    90     Reestablish a consistent sleep-wake cycle   70    75 Verbalize an accurate understanding of depression  60    85 Identify and  replace thoughts that support depression  80    100 Learn and implement behavioral strategies   60    85 Verbalize an understanding and resolution of current interpersonal problems       25    8 Learn and implement decision making skills   70    59 Learn and implement conflict resolution skills to resolve interpersonal problems       60    85 Verbalize an understanding of healthy and unhealthy emotions verbalize insight into how past relationships may be influence current experiences with depression                               60    80 Use mindfulness and acceptance strategies and increase value based behavior        60    80 Increase hopeful statements about the future.    60    85 Interventions: Engage the patient in behavioral activation Use instruction, modeling, and role-playing to build the patient 's general social, communication, and/or conflict resolution skills Use Acceptance and Commitment Therapy to help patient  accept uncomfortable realities in order to accomplish value-consistent goals Reinforce the patient 's insight into the role of her past emotional pain and present anxiety  Support the patient in following through with work, family, and social activities Teach and implement sleep hygiene practices  Teach the patient relaxation skills Assign the patient homework Discuss examples demonstrating that unrealistic worry overestimates the probability of threats and underestimates patient's ability  Assist the patient in analyzing his or her worries Help patient understand that avoidance is reinforcing  Consistent with treatment model, discuss how change in cognitive, behavioral, and interpersonal can help patient alleviate depression CBT Behavioral activation to help the patient explore the relationship, nature of the dispute Help the patient develop new interpersonal skills and relationships Conduct Problem solving therapy Teach conflict resolution skills Use a  process-experiential approach Conduct ACT  Diagnosis:Major depressive disorder, recurrent episode, moderate (HCC)  Generalized anxiety disorder  PTSD (post-traumatic stress disorder)  Plan:  -meet on Tuesday, September 16, 2024 at [employer].

## 2024-09-01 NOTE — Therapy (Signed)
 OUTPATIENT PHYSICAL THERAPY TREATMENT    Patient Name: [name] MRN: [id number] DOB:1946-01-27, 78 y.o., female Today's Date: 09/01/2024  END OF SESSION:  PT End of Session - 09/01/24 1403     Visit Number 22    Number of Visits 28    Date for PT Re-Evaluation 09/20/24    Authorization Type [employer]    Progress Note Due on Visit 30    PT Start Time 1403    PT Stop Time 1426    PT Time Calculation (min) 23 min    Activity Tolerance Patient tolerated treatment well    Behavior During Therapy [name] for tasks assessed/performed          Past Medical History:  Diagnosis Date   Abdominal pain 10/26/2013   Allergic rhinitis 01/25/2016   ANA positive 07/29/2013   Anemia    Arthralgia 08/18/2013   Arthritis    Asthma, allergic 08/05/2012   Atrial flutter    past history- not current   Atypical chest pain 01/01/2015   Bilateral carpal tunnel syndrome 03/04/2015   CAD in native artery 08/25/2010   Minor CAD 2009, 2011, low risk Myoview  2013 and 2016        Cataract    bilaterally removed    Cellulitis of breast 02/05/2023   Chronic constipation    Chronic cough 01/09/2017   Chronic interstitial cystitis 02/01/2017   Chronic night sweats 01/08/2015   Common bile duct dilatation 02/13/2023   Concussion    x 3   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   DDD (degenerative disc disease), cervical 09/17/2018   Degenerative scoliosis 04/22/2018   Deviated septum 05/12/2016   Dysphagia    Dyspnea    Dysuria 03/09/2021   Elevated alkaline phosphatase level 01/25/2023   Essential hypertension 08/25/2010   External hemorrhoids 02/05/2015   Facial trauma 01/22/2023   Family history of adverse reaction to anesthesia    Family history of malignant hyperthermia    father had this   Fibrocystic breast disease 10/18/2010   Fibromyalgia    Frequency of urination    Generalized anxiety disorder 08/03/2010   GERD (gastroesophageal reflux disease)    Headache 10/13/2013    High serum vitamin D  05/18/2021   History of chronic bronchitis    History of colonic polyp 02/25/2019   History of hiatal hernia    History of tachycardia    CONTROLLED  WITH ATENOLOL    Hyperglycemia 03/08/2021   Hyperkalemia 09/28/2022   Hyperlipidemia    Intertrigo 12/13/2022   Kidney stone 02/26/2019   Knee pain, right 04/30/2015   Left hip pain 04/30/2015   Leg pain 02/24/2011   Qualifier: Diagnosis of   By: Antonio DOJamee         Leukocytosis 07/05/2022   Local reaction to hymenoptera sting 05/09/2023   Low back pain 09/08/2020   Major depressive disorder 02/05/2013   Malignant neoplasm of lower-outer quadrant of right breast of female, estrogen receptor positive 01/03/2013   Nasal turbinate hypertrophy 05/12/2016   Oral herpes 02/05/2023   OSA and COPD overlap syndrome 06/10/2020   Osteoporosis 01/2019   T score -2.2 stable/improved from prior study   Pelvic pain    Peripheral neuropathy 05/22/2014   Personal history of radiation therapy    Pneumonia    Primary insomnia 06/25/2017   Pulmonary nodules 02/12/2015   Rash and other nonspecific skin eruption 05/09/2023   Recurrent falls 05/02/2020   Recurrent sinusitis 02/25/2019   Right arm pain 07/20/2016  RLS (restless legs syndrome) 11/09/2019   RUQ pain 07/05/2022   S/P radiation therapy 11/12/12 - 12/05/12   right Breast   Sciatica 04/30/2015   Sepsis 2014   from UTI    Sjogren's disease 12/12/2021   Splenic lesion 09/02/2012   Sprain of lateral ligament of ankle joint 07/31/2023   Stage 3 chronic kidney disease    Statin intolerance 02/29/2020   Stricture and stenosis of esophagus 10/19/2011   Tongue lesion 02/05/2023   Tremor 07/05/2022   Urgency of urination    Vertigo 01/22/2023   Vitamin B12 deficiency 03/08/2021   Vocal cord cyst 05/18/2021   Past Surgical History:  Procedure Laterality Date   24 HOUR PH STUDY N/A 04/03/2016   Procedure: 24 HOUR PH STUDY;  Surgeon: Elspeth Deward Naval,  MD;  Location: [facility];  Service: Gastroenterology;  Laterality: N/A;   ANKLE ARTHROSCOPY WITH RECONSTRUCTION Left 03/12/2024   Procedure: ANKLE ARTHROSCOPY WITH OPEN LATERAL ANKLE LIGAMENT STABILIZATION;  Surgeon: Barton Drape, MD;  Location: [facility];  Service: Orthopedics;  Laterality: Left;   BREAST BIOPSY Right 08/23/2012   ADH   BREAST BIOPSY Right 10/11/2012   Ductal Carcinoma   BREAST EXCISIONAL BIOPSY Right 09/04/2013   benign   BREAST LUMPECTOMY Right 10/11/2012   W/ SLN BX   CARDIAC CATHETERIZATION  09-13-2007  DR MORRIS   WELL-PRESERVED LVF/  DIFFUSE SCATTERED CORONARY CALCIFACATION AND ATHEROSCLEROSIS WITHOUT OBSTRUCTION   CARDIAC CATHETERIZATION  08-04-2010  DR MCALHANY   NON-OBSTRUCTIVE CAD/  pLAD 40%/  oLAD 30%/  mLAD 30%/  pRCA 30%/  EF 60%   CARDIOVASCULAR STRESS TEST  06-18-2012  DR McALHANY   LOW RISK NUCLEAR STUDY/  SMALL FIXED AREA OF MODERATELY DECREASED UPTAKE IN ANTEROSEPTAL WALL WHICH MAY BE ARTIFACTUAL/  NO ISCHEMIA/  EF 68%   COLONOSCOPY  09/29/2010   CYSTOSCOPY     CYSTOSCOPY WITH HYDRODISTENSION AND BIOPSY N/A 03/06/2014   Procedure: CYSTOSCOPY/HYDRODISTENSION/ INSTILATION OF MARCAINE  AND PYRIDIUM ;  Surgeon: Arlena LILLETTE Gal, MD;  Location: [facility];  Service: Urology;  Laterality: N/A;   CYSTOSCOPY/URETEROSCOPY/HOLMIUM LASER/STENT PLACEMENT Left 03/21/2023   Procedure: CYSTOSCOPY LEFT RETROGRADE PYELOGRAM, LEFT URETEROSCOPY, HOLMIUM LASER LITHOTRIPSYM, AND LEFT URETERAL STENT PLACEMENT;  Surgeon: Devere Lonni Righter, MD;  Location: [facility];  Service: Urology;  Laterality: Left;  60 MINUTES   ESOPHAGEAL MANOMETRY N/A 04/03/2016   Procedure: ESOPHAGEAL MANOMETRY (EM);  Surgeon: Elspeth Deward Naval, MD;  Location: [facility];  Service: Gastroenterology;  Laterality: N/A;   EXTRACORPOREAL SHOCK WAVE LITHOTRIPSY Left 02/06/2019   Procedure: EXTRACORPOREAL SHOCK WAVE LITHOTRIPSY (ESWL);  Surgeon: Ottelin,  Mark, MD;  Location: [facility];  Service: Urology;  Laterality: Left;   NASAL SINUS SURGERY  1985   ORIF RIGHT ANKLE  FX  2006   POLYPECTOMY     REMOVAL VOCAL CORD CYST  01/2013   REPAIR OF PERONEUS BREVIS TENDON Left 03/12/2024   Procedure: REPAIR OF PERONEUS TENDON;  Surgeon: Barton Drape, MD;  Location: [facility];  Service: Orthopedics;  Laterality: Left;   RIGHT BREAST BX  08/23/2012   RIGHT HAND SURGERY  X3  LAST ONE 2009   INCLUDES  ORIF RIGHT 5TH FINGER AND REVISION TWICE   SKIN CANCER EXCISION     face,arms   TONSILLECTOMY AND ADENOIDECTOMY  AGE 62   TOTAL ABDOMINAL HYSTERECTOMY W/ BILATERAL SALPINGOOPHORECTOMY  1982   W/  APPENDECTOMY   TRANSTHORACIC ECHOCARDIOGRAM  06/24/2012   GRADE I DIASTOLIC DYSFUNCTION/  EF 55-60%/  MILD MR   UPPER GASTROINTESTINAL ENDOSCOPY     Patient Active Problem List   Diagnosis Date Noted   Breast discharge 08/27/2024   Acute left ankle pain 08/14/2024   Pedal edema 05/20/2024   Otalgia of both ears 04/23/2024   Tinnitus of both ears 04/23/2024   Hypokalemia 12/10/2023   IDA (iron deficiency anemia) 09/12/2023   Stage 3 chronic kidney disease    Right ankle pain 07/31/2023   Common bile duct dilatation 02/13/2023   Oral herpes 02/05/2023   Elevated alkaline phosphatase level 01/25/2023   Vertigo 01/22/2023   Intertrigo 12/13/2022   Hyperkalemia 09/28/2022   Anemia 09/28/2022   Tremor 07/05/2022   RUQ pain 07/05/2022   Leukocytosis 07/05/2022   Sjogren's disease 12/12/2021   Vocal cord cyst 05/18/2021   Vitamin D  deficiency 05/18/2021   Dysuria 03/09/2021   Vitamin B12 deficiency 03/08/2021   Hyperglycemia 03/08/2021   OSA and COPD overlap syndrome 06/10/2020   Recurrent falls 05/02/2020   Statin intolerance 02/29/2020   RLS (restless legs syndrome) 11/09/2019   Recurrent sinusitis 02/25/2019   History of colonic polyp 02/25/2019   DDD (degenerative disc disease), cervical 09/17/2018   Degenerative scoliosis  04/22/2018   Primary insomnia 06/25/2017   Chronic interstitial cystitis 02/01/2017   Deviated nasal septum 05/12/2016   Hypertrophy of nasal turbinates 05/12/2016   Dysphagia    Allergic rhinitis 01/25/2016   Dyspnea    Sciatica 04/30/2015   Bilateral carpal tunnel syndrome 03/04/2015   Hyperlipidemia 03/01/2015   Pulmonary nodules 02/12/2015   External hemorrhoids 02/05/2015   Chronic night sweats 01/08/2015   Peripheral neuropathy 05/22/2014   Headache 10/13/2013   Arthralgia 08/18/2013   ANA positive 07/29/2013   Depression with anxiety 02/05/2013   Malignant neoplasm of lower-outer quadrant of right breast of female, estrogen receptor positive 01/03/2013   Asthma, allergic 08/05/2012   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   GERD (gastroesophageal reflux disease) 06/03/2012   Stricture and stenosis of esophagus 10/19/2011   Osteoporosis 04/17/2011   Fibrocystic breast disease 10/18/2010   Essential hypertension 08/25/2010   CAD in native artery 08/25/2010   Generalized anxiety disorder 08/03/2010   Bronchitis 03/10/2010   Fibromyalgia 01/11/2010   PCP: Domenica Harlene LABOR, MD  REFERRING PROVIDER: Barton Drape, MD (peroneal tendinitis)  Bonner Ade, MD (radiculopathy)   REFERRING DIAG: M76.72 (ICD-10-CM) - Peroneal tendinitis, left leg   M54.16 (ICD-10-CM) - Radiculopathy, lumbar region   THERAPY DIAG:  Muscle weakness (generalized)  Other abnormalities of gait and mobility  Unsteadiness on feet  Radiculopathy, lumbar region  Pain in left ankle and joints of left foot  Rationale for Evaluation and Treatment: Rehabilitation  ONSET DATE: 03/12/24  SUBJECTIVE:  SUBJECTIVE STATEMENT: Patient had bladder procedure on Friday due to ongoing urinary symptoms that are likely due to interstitial cystitis. It was recommended that she rest until her next f/u on this upcoming Friday. She reports no change in her back pain since the procedure.   PERTINENT  HISTORY: S/p Lt ankle arthroscopy Brostrom stabilization, peroneus brevis and longus debridement and tenosynovectomy 03/12/24 See extensive PMH above   PAIN:  Are you having pain? Yes: NPRS scale: 8/10 Pain location:  low/mid back Pain description: sharp Aggravating factors: walking, standing Relieving factors: laying on left side  PRECAUTIONS: Fall,progress per protocol; osteoporosis   WEIGHT BEARING RESTRICTIONS: No  FALLS:  Has patient fallen in last 6 months? Yes. Number of falls 2 (fell stepping up grassy hill, fell in her kitchen from turning quickly)  LIVING ENVIRONMENT: Lives with: lives with their spouse Lives in: House/apartment Stairs: Yes: Internal: 16 steps; on left going up Has following equipment at home: Single point cane  PATIENT GOALS: I want to be able to walk again.  NEXT MD VISIT: November 2025   OBJECTIVE:  Note: Objective measures were completed at Evaluation unless otherwise noted.  DIAGNOSTIC FINDINGS: none on file   PATIENT SURVEYS:  FADI: 46/104; 44% 06/26/24: FADI: 67/100; 67%  08/11/24: modified oswestry 30/50; 60% disability   COGNITION: Overall cognitive status: Within functional limits for tasks assessed     SENSATION: See palpation  EDEMA:  Mild swelling about Lt ankle   PALPATION: Hypersensitivity to palpation about distal fibula, lateral malleolus  LUMBAR ROM:   Active  A/PROM  eval  Flexion 50% limited LBP and RLE pain   Extension WNL LBP  Right lateral flexion 25% limited  Left lateral flexion 25% limited   Right rotation WNL  Left rotation WNL   (Blank rows = not tested)  LOWER EXTREMITY ROM: Active ROM Right eval Left eval 05/05/24 Left  05/20/24 Left  06/03/24 Left   Hip flexion       Hip extension       Hip abduction       Hip adduction       Hip internal rotation       Hip external rotation       Knee flexion       Knee extension       Ankle dorsiflexion  6 9  10   Ankle plantarflexion  50  65 67  Ankle  inversion  15  15 17   Ankle eversion  8  8 9    (Blank rows = not tested)  LOWER EXTREMITY MMT: MMT Right eval Left eval 06/26/24 Left  08/04/24 Left  08/11/24   Hip flexion     Rt: 4; Lt: 4-  Hip extension       Hip abduction     4 bilateral   Hip adduction       Hip internal rotation       Hip external rotation       Knee flexion       Knee extension       Ankle dorsiflexion  3+ 4+ 4+   Ankle plantarflexion  3+ sitting 4- in sitting 4 in sitting    Ankle inversion  3+ 4- 4   Ankle eversion  3+ 4 5    (Blank rows = not tested)  FUNCTIONAL TESTS:  TUG: 13.5 seconds Romberg x 30 seconds Semi-tandem unable   06/09/24: TUG: 11.1 seconds   06/26/24: tandem 1 second   08/11/24: Rt rolling requires use of UE support on the hip to roll, increased pain   GAIT: Distance walked: 20 ft  Assistive device utilized: Single point cane. Ankle brace Level of assistance: Modified independence Comments: limited push-off LLE, foot ER,  [facility] Adult PT Treatment:                                                DATE: 09/01/24 Therapeutic Exercise: Reviewed HEP recommending to discontinue core and hip strengthening currently, but continue to habituation exercises for vestibular training  Self Care: pain neuroscience education  Discussed mindfulness and mediation with videos and handout added to HEP.    [facility] Adult PT Treatment:  DATE: 08/26/24 Therapeutic Exercise: Reviewed HEP, recommended to d/c core strengthening until f/u regarding kidney testing   Therapeutic Activity: VOR x 1 with checkered background, seated 90 BPM; 5 trials 20-30 sec  Habituation: nose to knee x 5 each  Seated ball toss 2 x 10  Self Care: F/u with nephrology regarding results and worsening symptoms   [facility] Adult PT Treatment:                                                DATE: 08/18/24 Therapeutic Exercise: Supine Bent knee fallouts 5 reps R and L sides Standing  lumbar  extension at countertop x 5 reps within tolerable range of motion lumbar flexion at countertop with cues to stay within tolerable range Hip adduction stretch Manual Therapy: Self massage with deflated pilates ball on sacrum in supine Self massage trying massage ball against wall on R glut-- too painful and not able to tolerate IASTM glut med and R sacral ligaments with some discomfort-- performed to tolerance Neuromuscular re-ed: TA brace in hooklying x 10 reps x 5 second holds-- initially had muscle spasm, but improved  Therapeutic Activity: Backwards walking for glutes firing 15 feet x 3 reps with SBA   [facility] Adult PT Treatment:                                                DATE: 08/13/24 Neuromuscular Re-education: TA brace in hooklying x 10; 5 sec hold Posterior pelvic tilts d/c due to back pain  Hooklying hip abduction green band x 10  Isometric hip adduction x 10 Gait training: With RW working on heel strike, increasing stance time on RLE, and knee/hip flexion Modalities: Game ready (VASO) x 10 minutes low compression; 34 degrees; Lt ankle.  Self Care: - Recommended f/u with urgent care or surgeon if pain worsens or continues over next 1-2 days.  -Use RW currently due to pain.  [place] for pain control    PATIENT EDUCATION:  Education details:see treatment  Person educated: Patient Education method: Explanation, handout  Education comprehension: verbalized understanding  HOME EXERCISE PROGRAM: Access Code: [email or web address] URL: [email or web address] Date: 08/13/2024 Prepared by: Lucie Meeter  Exercises - Seated Plantar Fascia Mobilization with Small Ball  - 1 x daily - 7 x weekly - 3-5 minute  hold - Seated Great Toe Extension  - 1 x daily - 7 x weekly - 2 sets - 10 reps - Long Sitting Ankle Plantar Flexion with Resistance  - 1 x daily - 7 x weekly - 2 sets - 10 reps - Long Sitting Ankle Inversion with Resistance  - 1 x daily - 7 x weekly - 2 sets - 10 reps -  Toe Raises with Counter Support  - 1 x daily - 7 x weekly - 2 sets - 10 reps - Heel Raises with Counter Support  - 1 x daily - 7 x weekly - 2 sets - 10 reps - Towel Scrunches  - 1 x daily - 7 x weekly - 1 sets - 5 reps - Rolling rightleft sides for vestibular habituation  - 1-2 x daily - 7 x weekly - 1 sets - 10 reps - Seated Right Head Turns Vestibular Habituation  - 1-2  x daily - 7 x weekly - 1 sets - 5 reps - Standing Gaze Stabilization with Head Rotation  - 1 x daily - 7 x weekly - 2 sets - 10 reps - Standing Hip Abduction with Counter Support  - 1 x daily - 7 x weekly - 2 sets - 10 reps - Standing Romberg to 3/4 Tandem Stance  - 1 x daily - 7 x weekly - 3 sets - max  hold - Gastroc Stretch on Wall  - 1 x daily - 7 x weekly - 3 sets - 30 sec  hold - Hooklying Clamshell with Resistance  - 1 x daily - 7 x weekly - 2 sets - 10 reps - Supine Hip Adduction Isometric with Ball  - 1 x daily - 7 x weekly - 2 sets - 10 reps - Supine Transversus Abdominis Bracing - Hands on Stomach  - 1 x daily - 7 x weekly - 2 sets - 10 reps - 5 sec  hold  ASSESSMENT:  CLINICAL IMPRESSION: Patient underwent bladder procedure on Friday and was instructed to rest until next f/u this upcoming Friday. She continues to report high levels of back pain that have not improved since this procedure. Today we reviewed HEP recommending patient discontinue core/hip strengthening at this time with patient verbalizing understanding. We discussed pain neuroscience education and added mediation videos to HEP for patient to complete independently. PT is on hold until provider f/u on Friday with patient in agreement with this plan.   EVAL: Patient is a 78 y.o. female who was seen today for physical therapy evaluation and treatment for s/p Lt ankle arthroscopy Brostrom stabilization, peroneus brevis and longus debridement and tenosynovectomy on 03/12/24. She demonstrates ROM, strength, gait and balance deficits that are consistent with her  recent post-operative status. She will benefit from skilled PT to address the above stated deficits in order to return to optimal function.   OBJECTIVE IMPAIRMENTS: Abnormal gait, decreased activity tolerance, decreased balance, decreased endurance, decreased knowledge of condition, decreased mobility, difficulty walking, decreased ROM, decreased strength, increased edema, impaired flexibility, impaired sensation, improper body mechanics, postural dysfunction, and pain.   GOALS: Goals reviewed with patient? Yes  SHORT TERM GOALS: Target date: 06/09/2024  Patient will be independent and compliant with initial HEP.  Baseline: issued at eval  Goal status: MET  2.  Patient will demonstrate at least 60 degrees of Lt ankle plantarflexion AROM to improve push-off during gait cycle.  Baseline: see above Goal status: MET  3.  Patient will demonstrate at least 10 degrees of Lt ankle dorsiflexion AROM to improve gait mechanics.  Baseline: see above Goal status: MET  4.  Patient will complete TUG in </= 12 seconds to reduce fall risk.  Baseline: see above Goal status: MET  LONG TERM GOALS: Target date: 09/20/24  Patient will score >/= 60/104 on the Foot and Ankle Disability Index (MDC 7) to signify clinically meaningful improvement in functional abilities.  Baseline: see above Goal status: MET  2.  Patient will maintain tandem stance for at least 10 seconds to improve gait stability.  Baseline: see above 06/26/24: tandem 1 second 08/04/24: tandem 4 second Goal status: progressing   3.  Patient will be modified independent with curb/stair negotiation.  Baseline: uses cane and requires support from spouse  06/26/24: requires cane  Goal status: MET  4.  Patient will self-report ability to tolerate at least 30 minutes of standing/walking activity.  Baseline: 10 minutes  06/26/24: 20 minutes  08/04/24: limited due to back pain and dizziness, 10 minutes  Goal status: progressing   5.  Patient  will demonstrate at least 4/5 Lt ankle strength to improve stability when navigating on uneven terrain.  Baseline: see above  Goal status: MET  6.  Patient will be independent with advanced home program to progress/maintain current level of function.  Baseline: initial HEP issued  Goal status: progressing    7. Patient will tolerate rolling R and L without d/o dizziness.   Baseline: 4/10 dizzines 08/04/24: 4/10 dizziness baseline    Goal status: deferred due to 4/10 baseline dizzines     8. Patient will tolerate gaze x 1 viewing in sitting x 20 seconds to work towards improved tolerance to turns in standing.   Baseline: Unable to do 2 reps in sitting   08/04/24: VOR x 1, 20 seconds    Goal status: MET   9. Patient will score </= 40 % disability on the Oswestry Disability Index (MCID is 20%) to signify clinically meaningful improvement in functional abilities.    Baseline: see above   Goal status: NEW  10. Patient will demonstrate proper lifting mechanics to improve her tolerance to loading/unloading the dishwasher.    Baseline: increased pain, limits bending activity   Goal status: NEW  PLAN:  PT FREQUENCY: 2x/week  PT DURATION: 6 weeks  PLANNED INTERVENTIONS: [id number]- PT Re-evaluation, 97750- Physical Performance Testing, 97110-Therapeutic exercises, 97530- Therapeutic activity, [id number]- Neuromuscular re-education, 97535- Self Care, [id number]- Manual therapy, [id number]- Gait training, [id number]- Aquatic Therapy, [id number]- Vasopneumatic device, Taping, Dry Needling, Cryotherapy, and Moist heat  PLAN FOR NEXT SESSION: review and progress HEP; ankle strengthening; static balance. For vestibular: Progress habituation (seated head turns>standing head turns), add brandt daroff habituation, work on gaze as tolerated in clinic with cues,  Consider BBQ roll for canolith repositioning to R if symptoms remain after habituation (responded well to habituation in clinic and may not need). Convergence-- give for HEP  (only provoking mild sxs); core/hip strengthening; trial of extension for back pain; PT ON HOLD UNTIL MD F/U ON FRIDAY 09/05/24.   Laniqua Torrens, PT, DPT, ATC 09/01/24 2:38 PM

## 2024-09-02 DIAGNOSIS — M25522 Pain in left elbow: Secondary | ICD-10-CM | POA: Diagnosis not present

## 2024-09-02 DIAGNOSIS — M359 Systemic involvement of connective tissue, unspecified: Secondary | ICD-10-CM | POA: Diagnosis not present

## 2024-09-02 DIAGNOSIS — M25521 Pain in right elbow: Secondary | ICD-10-CM | POA: Diagnosis not present

## 2024-09-02 DIAGNOSIS — M810 Age-related osteoporosis without current pathological fracture: Secondary | ICD-10-CM | POA: Diagnosis not present

## 2024-09-02 DIAGNOSIS — M797 Fibromyalgia: Secondary | ICD-10-CM | POA: Diagnosis not present

## 2024-09-02 DIAGNOSIS — M3501 Sicca syndrome with keratoconjunctivitis: Secondary | ICD-10-CM | POA: Diagnosis not present

## 2024-09-02 DIAGNOSIS — M1990 Unspecified osteoarthritis, unspecified site: Secondary | ICD-10-CM | POA: Diagnosis not present

## 2024-09-02 DIAGNOSIS — Z79899 Other long term (current) drug therapy: Secondary | ICD-10-CM | POA: Diagnosis not present

## 2024-09-02 DIAGNOSIS — M353 Polymyalgia rheumatica: Secondary | ICD-10-CM | POA: Diagnosis not present

## 2024-09-02 DIAGNOSIS — L299 Pruritus, unspecified: Secondary | ICD-10-CM | POA: Diagnosis not present

## 2024-09-03 ENCOUNTER — Ambulatory Visit

## 2024-09-05 DIAGNOSIS — N3020 Other chronic cystitis without hematuria: Secondary | ICD-10-CM | POA: Diagnosis not present

## 2024-09-08 ENCOUNTER — Ambulatory Visit: Admitting: Rehabilitative and Restorative Service Providers"

## 2024-09-10 ENCOUNTER — Ambulatory Visit

## 2024-09-10 DIAGNOSIS — R2689 Other abnormalities of gait and mobility: Secondary | ICD-10-CM

## 2024-09-10 DIAGNOSIS — M6281 Muscle weakness (generalized): Secondary | ICD-10-CM | POA: Diagnosis not present

## 2024-09-10 DIAGNOSIS — R2681 Unsteadiness on feet: Secondary | ICD-10-CM

## 2024-09-10 DIAGNOSIS — M5416 Radiculopathy, lumbar region: Secondary | ICD-10-CM

## 2024-09-10 NOTE — Therapy (Signed)
 OUTPATIENT PHYSICAL THERAPY TREATMENT    Patient Name: NATAJAH DERDERIAN MRN: [id number] DOB:[birth year], 78 y.o., female Today's Date: 09/10/2024  END OF SESSION:  PT End of Session - 09/10/24 1025     Visit Number 23    Number of Visits 28    Date for PT Re-Evaluation 09/20/24    Authorization Type [employer]    Progress Note Due on Visit 30    PT Start Time 1023    PT Stop Time 1108    PT Time Calculation (min) 45 min    Activity Tolerance Patient tolerated treatment well    Behavior During Therapy [facility] for tasks assessed/performed           Past Medical History:  Diagnosis Date   Abdominal pain 10/26/2013   Allergic rhinitis 01/25/2016   ANA positive 07/29/2013   Anemia    Arthralgia 08/18/2013   Arthritis    Asthma, allergic 08/05/2012   Atrial flutter    past history- not current   Atypical chest pain 01/01/2015   Bilateral carpal tunnel syndrome 03/04/2015   CAD in native artery 08/25/2010   Minor CAD 2009, 2011, low risk Myoview  2013 and 2016        Cataract    bilaterally removed    Cellulitis of breast 02/05/2023   Chronic constipation    Chronic cough 01/09/2017   Chronic interstitial cystitis 02/01/2017   Chronic night sweats 01/08/2015   Common bile duct dilatation 02/13/2023   Concussion    x 3   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   DDD (degenerative disc disease), cervical 09/17/2018   Degenerative scoliosis 04/22/2018   Deviated septum 05/12/2016   Dysphagia    Dyspnea    Dysuria 03/09/2021   Elevated alkaline phosphatase level 01/25/2023   Essential hypertension 08/25/2010   External hemorrhoids 02/05/2015   Facial trauma 01/22/2023   Family history of adverse reaction to anesthesia    Family history of malignant hyperthermia    father had this   Fibrocystic breast disease 10/18/2010   Fibromyalgia    Frequency of urination    Generalized anxiety disorder 08/03/2010   GERD (gastroesophageal reflux disease)    Headache  10/13/2013   High serum vitamin D  05/18/2021   History of chronic bronchitis    History of colonic polyp 02/25/2019   History of hiatal hernia    History of tachycardia    CONTROLLED  WITH ATENOLOL    Hyperglycemia 03/08/2021   Hyperkalemia 09/28/2022   Hyperlipidemia    Intertrigo 12/13/2022   Kidney stone 02/26/2019   Knee pain, right 04/30/2015   Left hip pain 04/30/2015   Leg pain 02/24/2011   Qualifier: Diagnosis of   By: Antonio DOJamee         Leukocytosis 07/05/2022   Local reaction to hymenoptera sting 05/09/2023   Low back pain 09/08/2020   Major depressive disorder 02/05/2013   Malignant neoplasm of lower-outer quadrant of right breast of female, estrogen receptor positive 01/03/2013   Nasal turbinate hypertrophy 05/12/2016   Oral herpes 02/05/2023   OSA and COPD overlap syndrome 06/10/2020   Osteoporosis 01/2019   T score -2.2 stable/improved from prior study   Pelvic pain    Peripheral neuropathy 05/22/2014   Personal history of radiation therapy    Pneumonia    Primary insomnia 06/25/2017   Pulmonary nodules 02/12/2015   Rash and other nonspecific skin eruption 05/09/2023   Recurrent falls 05/02/2020   Recurrent sinusitis 02/25/2019   Right arm pain  07/20/2016   RLS (restless legs syndrome) 11/09/2019   RUQ pain 07/05/2022   S/P radiation therapy 11/12/12 - 12/05/12   right Breast   Sciatica 04/30/2015   Sepsis 2014   from UTI    Sjogren's disease 12/12/2021   Splenic lesion 09/02/2012   Sprain of lateral ligament of ankle joint 07/31/2023   Stage 3 chronic kidney disease    Statin intolerance 02/29/2020   Stricture and stenosis of esophagus 10/19/2011   Tongue lesion 02/05/2023   Tremor 07/05/2022   Urgency of urination    Vertigo 01/22/2023   Vitamin B12 deficiency 03/08/2021   Vocal cord cyst 05/18/2021   Past Surgical History:  Procedure Laterality Date   24 HOUR PH STUDY N/A 04/03/2016   Procedure: 24 HOUR PH STUDY;  Surgeon: Elspeth Deward Naval, MD;  Location: [facility];  Service: Gastroenterology;  Laterality: N/A;   ANKLE ARTHROSCOPY WITH RECONSTRUCTION Left 03/12/2024   Procedure: ANKLE ARTHROSCOPY WITH OPEN LATERAL ANKLE LIGAMENT STABILIZATION;  Surgeon: Barton Drape, MD;  Location: [facility];  Service: Orthopedics;  Laterality: Left;   BREAST BIOPSY Right 08/23/2012   ADH   BREAST BIOPSY Right 10/11/2012   Ductal Carcinoma   BREAST EXCISIONAL BIOPSY Right 09/04/2013   benign   BREAST LUMPECTOMY Right 10/11/2012   W/ SLN BX   CARDIAC CATHETERIZATION  09-13-2007  DR MORRIS   WELL-PRESERVED LVF/  DIFFUSE SCATTERED CORONARY CALCIFACATION AND ATHEROSCLEROSIS WITHOUT OBSTRUCTION   CARDIAC CATHETERIZATION  08-04-2010  DR MCALHANY   NON-OBSTRUCTIVE CAD/  pLAD 40%/  oLAD 30%/  mLAD 30%/  pRCA 30%/  EF 60%   CARDIOVASCULAR STRESS TEST  06-18-2012  DR McALHANY   LOW RISK NUCLEAR STUDY/  SMALL FIXED AREA OF MODERATELY DECREASED UPTAKE IN ANTEROSEPTAL WALL WHICH MAY BE ARTIFACTUAL/  NO ISCHEMIA/  EF 68%   COLONOSCOPY  09/29/2010   CYSTOSCOPY     CYSTOSCOPY WITH HYDRODISTENSION AND BIOPSY N/A 03/06/2014   Procedure: CYSTOSCOPY/HYDRODISTENSION/ INSTILATION OF MARCAINE  AND PYRIDIUM ;  Surgeon: Arlena LILLETTE Gal, MD;  Location: [facility];  Service: Urology;  Laterality: N/A;   CYSTOSCOPY/URETEROSCOPY/HOLMIUM LASER/STENT PLACEMENT Left 03/21/2023   Procedure: CYSTOSCOPY LEFT RETROGRADE PYELOGRAM, LEFT URETEROSCOPY, HOLMIUM LASER LITHOTRIPSYM, AND LEFT URETERAL STENT PLACEMENT;  Surgeon: Devere Lonni Righter, MD;  Location: [facility];  Service: Urology;  Laterality: Left;  60 MINUTES   ESOPHAGEAL MANOMETRY N/A 04/03/2016   Procedure: ESOPHAGEAL MANOMETRY (EM);  Surgeon: Elspeth Deward Naval, MD;  Location: [facility];  Service: Gastroenterology;  Laterality: N/A;   EXTRACORPOREAL SHOCK WAVE LITHOTRIPSY Left 02/06/2019   Procedure: EXTRACORPOREAL SHOCK WAVE LITHOTRIPSY (ESWL);   Surgeon: Ottelin, Mark, MD;  Location: [facility];  Service: Urology;  Laterality: Left;   NASAL SINUS SURGERY  1985   ORIF RIGHT ANKLE  FX  2006   POLYPECTOMY     REMOVAL VOCAL CORD CYST  01/2013   REPAIR OF PERONEUS BREVIS TENDON Left 03/12/2024   Procedure: REPAIR OF PERONEUS TENDON;  Surgeon: Barton Drape, MD;  Location: [facility];  Service: Orthopedics;  Laterality: Left;   RIGHT BREAST BX  08/23/2012   RIGHT HAND SURGERY  X3  LAST ONE 2009   INCLUDES  ORIF RIGHT 5TH FINGER AND REVISION TWICE   SKIN CANCER EXCISION     face,arms   TONSILLECTOMY AND ADENOIDECTOMY  AGE 34   TOTAL ABDOMINAL HYSTERECTOMY W/ BILATERAL SALPINGOOPHORECTOMY  1982   W/  APPENDECTOMY   TRANSTHORACIC ECHOCARDIOGRAM  06/24/2012   GRADE I DIASTOLIC DYSFUNCTION/  EF 55-60%/  MILD MR   UPPER GASTROINTESTINAL ENDOSCOPY     Patient Active Problem List   Diagnosis Date Noted   Breast discharge 08/27/2024   Acute left ankle pain 08/14/2024   Pedal edema 05/20/2024   Otalgia of both ears 04/23/2024   Tinnitus of both ears 04/23/2024   Hypokalemia 12/10/2023   IDA (iron deficiency anemia) 09/12/2023   Stage 3 chronic kidney disease    Right ankle pain 07/31/2023   Common bile duct dilatation 02/13/2023   Oral herpes 02/05/2023   Elevated alkaline phosphatase level 01/25/2023   Vertigo 01/22/2023   Intertrigo 12/13/2022   Hyperkalemia 09/28/2022   Anemia 09/28/2022   Tremor 07/05/2022   RUQ pain 07/05/2022   Leukocytosis 07/05/2022   Sjogren's disease 12/12/2021   Vocal cord cyst 05/18/2021   Vitamin D  deficiency 05/18/2021   Dysuria 03/09/2021   Vitamin B12 deficiency 03/08/2021   Hyperglycemia 03/08/2021   OSA and COPD overlap syndrome 06/10/2020   Recurrent falls 05/02/2020   Statin intolerance 02/29/2020   RLS (restless legs syndrome) 11/09/2019   Recurrent sinusitis 02/25/2019   History of colonic polyp 02/25/2019   DDD (degenerative disc disease), cervical 09/17/2018    Degenerative scoliosis 04/22/2018   Primary insomnia 06/25/2017   Chronic interstitial cystitis 02/01/2017   Deviated nasal septum 05/12/2016   Hypertrophy of nasal turbinates 05/12/2016   Dysphagia    Allergic rhinitis 01/25/2016   Dyspnea    Sciatica 04/30/2015   Bilateral carpal tunnel syndrome 03/04/2015   Hyperlipidemia 03/01/2015   Pulmonary nodules 02/12/2015   External hemorrhoids 02/05/2015   Chronic night sweats 01/08/2015   Peripheral neuropathy 05/22/2014   Headache 10/13/2013   Arthralgia 08/18/2013   ANA positive 07/29/2013   Depression with anxiety 02/05/2013   Malignant neoplasm of lower-outer quadrant of right breast of female, estrogen receptor positive 01/03/2013   Asthma, allergic 08/05/2012   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   GERD (gastroesophageal reflux disease) 06/03/2012   Stricture and stenosis of esophagus 10/19/2011   Osteoporosis 04/17/2011   Fibrocystic breast disease 10/18/2010   Essential hypertension 08/25/2010   CAD in native artery 08/25/2010   Generalized anxiety disorder 08/03/2010   Bronchitis 03/10/2010   Fibromyalgia 01/11/2010   PCP: Domenica Harlene LABOR, MD  REFERRING PROVIDER: Barton Drape, MD (peroneal tendinitis)  Bonner Ade, MD (radiculopathy)   REFERRING DIAG: M76.72 (ICD-10-CM) - Peroneal tendinitis, left leg   M54.16 (ICD-10-CM) - Radiculopathy, lumbar region   THERAPY DIAG:  Muscle weakness (generalized)  Other abnormalities of gait and mobility  Unsteadiness on feet  Radiculopathy, lumbar region  Rationale for Evaluation and Treatment: Rehabilitation  ONSET DATE: 03/12/24  SUBJECTIVE:  SUBJECTIVE STATEMENT: Patient had another bladder procedure last Friday (9/12), which has calmed down her pain a little bit. There are concerns for a kidney stone and was instructed to do what you feel like doing. She has not passed a kidney stone and has plans to f/u with urology in 2 weeks if she has not passed a stone.  She is continuing to have back pain and the pain is starting to radiate into the buttock and left posterior thigh. Feels that the mindfulness/meditation videos were helpful. Feels that the foot is doing well from a surgical perspective, but the neuropathy will wake her at night. Patient reports the dizziness is better and has been consistent with her vestibular exercises.   PERTINENT HISTORY: S/p Lt ankle arthroscopy Brostrom stabilization, peroneus brevis and longus debridement and tenosynovectomy 03/12/24 See extensive PMH above  PAIN:  Are you having pain? Yes: NPRS scale: 7/10 Pain location:  Lt low back radiating into posterior LLE Pain description: sharp,nagging Aggravating factors: walking, standing,sitting Relieving factors: heat  PRECAUTIONS: Fall,progress per protocol; osteoporosis   WEIGHT BEARING RESTRICTIONS: No  FALLS:  Has patient fallen in last 6 months? Yes. Number of falls 2 (fell stepping up grassy hill, fell in her kitchen from turning quickly)  LIVING ENVIRONMENT: Lives with: lives with their spouse Lives in: House/apartment Stairs: Yes: Internal: 16 steps; on left going up Has following equipment at home: Single point cane  PATIENT GOALS: I want to be able to walk again.  NEXT MD VISIT: November 2025   OBJECTIVE:  Note: Objective measures were completed at Evaluation unless otherwise noted.  DIAGNOSTIC FINDINGS: none on file   PATIENT SURVEYS:  FADI: 46/104; 44% 06/26/24: FADI: 67/100; 67%  08/11/24: modified oswestry 30/50; 60% disability   COGNITION: Overall cognitive status: Within functional limits for tasks assessed     SENSATION: See palpation  EDEMA:  Mild swelling about Lt ankle   PALPATION: Hypersensitivity to palpation about distal fibula, lateral malleolus  LUMBAR ROM:   Active  A/PROM  eval  Flexion 50% limited LBP and RLE pain   Extension WNL LBP  Right lateral flexion 25% limited  Left lateral flexion 25% limited   Right  rotation WNL  Left rotation WNL   (Blank rows = not tested)  LOWER EXTREMITY ROM: Active ROM Right eval Left eval 05/05/24 Left  05/20/24 Left  06/03/24 Left   Hip flexion       Hip extension       Hip abduction       Hip adduction       Hip internal rotation       Hip external rotation       Knee flexion       Knee extension       Ankle dorsiflexion  6 9  10   Ankle plantarflexion  50  65 67  Ankle inversion  15  15 17   Ankle eversion  8  8 9    (Blank rows = not tested)  LOWER EXTREMITY MMT: MMT Right eval Left eval 06/26/24 Left  08/04/24 Left  08/11/24   Hip flexion     Rt: 4; Lt: 4-  Hip extension       Hip abduction     4 bilateral   Hip adduction       Hip internal rotation       Hip external rotation       Knee flexion       Knee extension       Ankle dorsiflexion  3+ 4+ 4+   Ankle plantarflexion  3+ sitting 4- in sitting 4 in sitting    Ankle inversion  3+ 4- 4   Ankle eversion  3+ 4 5    (Blank rows = not tested)  FUNCTIONAL TESTS:  TUG: 13.5 seconds Romberg x 30 seconds Semi-tandem unable   06/09/24: TUG: 11.1 seconds   06/26/24: tandem 1 second   08/11/24: Rt rolling requires use of UE support on the hip to roll, increased pain   GAIT: Distance walked: 20 ft  Assistive device utilized: Single point cane. Ankle brace Level of assistance: Modified independence Comments: limited push-off LLE, foot ER,  [facility] Adult PT Treatment:  DATE: 09/10/24 Therapeutic Exercise: Standing lumbar extension 2 x 10  Prone lying x 3 minutes  Sciatic nerve glides LLE x 5  Updated HEP   Self Care: Educated patient on bending/lifting mechanics, posture, sleep positioning recommendations, avoiding trunk flexion activity with handout provided Modalities for pain control- ice, heat, TENS unit.  Educated on rationale of repeated movement for pain centralization    [facility] Adult PT Treatment:                                                 DATE: 09/01/24 Therapeutic Exercise: Reviewed HEP recommending to discontinue core and hip strengthening currently, but continue to habituation exercises for vestibular training  Self Care: pain neuroscience education  Discussed mindfulness and mediation with videos and handout added to HEP.    [facility] Adult PT Treatment:                                                DATE: 08/26/24 Therapeutic Exercise: Reviewed HEP, recommended to d/c core strengthening until f/u regarding kidney testing   Therapeutic Activity: VOR x 1 with checkered background, seated 90 BPM; 5 trials 20-30 sec  Habituation: nose to knee x 5 each  Seated ball toss 2 x 10  Self Care: F/u with nephrology regarding results and worsening symptoms   [facility] Adult PT Treatment:                                                DATE: 08/18/24 Therapeutic Exercise: Supine Bent knee fallouts 5 reps R and L sides Standing  lumbar extension at countertop x 5 reps within tolerable range of motion lumbar flexion at countertop with cues to stay within tolerable range Hip adduction stretch Manual Therapy: Self massage with deflated pilates ball on sacrum in supine Self massage trying massage ball against wall on R glut-- too painful and not able to tolerate IASTM glut med and R sacral ligaments with some discomfort-- performed to tolerance Neuromuscular re-ed: TA brace in hooklying x 10 reps x 5 second holds-- initially had muscle spasm, but improved  Therapeutic Activity: Backwards walking for glutes firing 15 feet x 3 reps with SBA   [facility] Adult PT Treatment:                                                DATE: 08/13/24 Neuromuscular Re-education: TA brace in hooklying x 10; 5 sec hold Posterior pelvic tilts d/c due to back pain  Hooklying hip abduction green band x 10  Isometric hip adduction x 10 Gait training: With RW working on heel strike, increasing stance time on RLE, and knee/hip flexion Modalities: Game ready  (VASO) x 10 minutes low compression; 34 degrees; Lt ankle.  Self Care: - Recommended f/u with urgent care or surgeon if pain worsens or continues over next 1-2 days.  -Use RW currently due to pain.  - Ice for pain control    PATIENT EDUCATION:  Education details:see treatment  Person educated: Patient Education method: Explanation, handout  Education comprehension: verbalized understanding  HOME EXERCISE PROGRAM: Access Code: [email or web address] URL: [email or web address] Date: 09/10/2024 Prepared by: Lucie Meeter  Exercises - Seated Plantar Fascia Mobilization with Small Ball  - 1 x daily - 7 x weekly - 3-5 minute  hold - Seated Great Toe Extension  - 1 x daily - 7 x weekly - 2 sets - 10 reps - Long Sitting Ankle Plantar Flexion with Resistance  - 1 x daily - 7 x weekly - 2 sets - 10 reps - Long Sitting Ankle Inversion with Resistance  - 1 x daily - 7 x weekly - 2 sets - 10 reps - Toe Raises with Counter Support  - 1 x daily - 7 x weekly - 2 sets - 10 reps - Heel Raises with Counter Support  - 1 x daily - 7 x weekly - 2 sets - 10 reps - Towel Scrunches  - 1 x daily - 7 x weekly - 1 sets - 5 reps - Rolling rightleft sides for vestibular habituation  - 1-2 x daily - 7 x weekly - 1 sets - 10 reps - Seated Right Head Turns Vestibular Habituation  - 1-2 x daily - 7 x weekly - 1 sets - 5 reps - Standing Gaze Stabilization with Head Rotation  - 1 x daily - 7 x weekly - 2 sets - 10 reps - Standing Hip Abduction with Counter Support  - 1 x daily - 7 x weekly - 2 sets - 10 reps - Standing Romberg to 3/4 Tandem Stance  - 1 x daily - 7 x weekly - 3 sets - max  hold - Gastroc Stretch on Wall  - 1 x daily - 7 x weekly - 3 sets - 30 sec  hold - Hooklying Clamshell with Resistance  - 1 x daily - 7 x weekly - 2 sets - 10 reps - Supine Hip Adduction Isometric with Ball  - 1 x daily - 7 x weekly - 2 sets - 10 reps - Supine Transversus Abdominis Bracing - Hands on Stomach  - 1 x daily - 7 x  weekly - 2 sets - 10 reps - 5 sec  hold - 5 Minute Guided Meditation  - 7 x weekly - 10 Minute Guided Meditation  - 7 x weekly - 15 Minute Guided Meditation  - 7 x weekly - 20 Minute Guided Meditation  - 7 x weekly - Standing Lumbar Extension  - 6 x daily - 7 x weekly - 1 sets - 10 reps - Lying Prone  - 2 x daily - 7 x weekly - 5-10 minutes  hold - Supine 90/90 Sciatic Nerve Glide with Knee Flexion/Extension  - 1 x daily - 7 x weekly - 1 sets - 10 reps  Patient Education - Treating Persistent Pain Without Medications: Mindfulness  ASSESSMENT:  CLINICAL IMPRESSION: Patient had f/u with urogology and was instructed to do what she feels like doing. There are concerns for a kidney stone with patient scheduled for another f/u on 9/26 if kidney stone has not passed. She reports worsening of back pain that is radiating into the Lt posterior buttock and thigh. She responded well to extension biased movement in clinic with ability to centralize her pain to the low back with standing lumbar extension and reduce her pain level with prone lying. Lengthy discussion regarding proper bending/lifting mechanics, posture, and avoiding trunk flexion with daily tasks with handout provided.  Patient tolerated session well today reporting an overall improvement in back and LLE at conclusion of session.   EVAL: Patient is a 78 y.o. female who was seen today for physical therapy evaluation and treatment for s/p Lt ankle arthroscopy Brostrom stabilization, peroneus brevis and longus debridement and tenosynovectomy on 03/12/24. She demonstrates ROM, strength, gait and balance deficits that are consistent with her recent post-operative status. She will benefit from skilled PT to address the above stated deficits in order to return to optimal function.   OBJECTIVE IMPAIRMENTS: Abnormal gait, decreased activity tolerance, decreased balance, decreased endurance, decreased knowledge of condition, decreased mobility, difficulty  walking, decreased ROM, decreased strength, increased edema, impaired flexibility, impaired sensation, improper body mechanics, postural dysfunction, and pain.   GOALS: Goals reviewed with patient? Yes  SHORT TERM GOALS: Target date: 06/09/2024  Patient will be independent and compliant with initial HEP.  Baseline: issued at eval  Goal status: MET  2.  Patient will demonstrate at least 60 degrees of Lt ankle plantarflexion AROM to improve push-off during gait cycle.  Baseline: see above Goal status: MET  3.  Patient will demonstrate at least 10 degrees of Lt ankle dorsiflexion AROM to improve gait mechanics.  Baseline: see above Goal status: MET  4.  Patient will complete TUG in </= 12 seconds to reduce fall risk.  Baseline: see above Goal status: MET  LONG TERM GOALS: Target date: 09/20/24  Patient will score >/= 60/104 on the Foot and Ankle Disability Index (MDC 7) to signify clinically meaningful improvement in functional abilities.  Baseline: see above Goal status: MET  2.  Patient will maintain tandem stance for at least 10 seconds to improve gait stability.  Baseline: see above 06/26/24: tandem 1 second 08/04/24: tandem 4 second Goal status: progressing   3.  Patient will be modified independent with curb/stair negotiation.  Baseline: uses cane and requires support from spouse  06/26/24: requires cane  Goal status: MET  4.  Patient will self-report ability to tolerate at least 30 minutes of standing/walking activity.  Baseline: 10 minutes  06/26/24: 20 minutes  08/04/24: limited due to back pain and dizziness, 10 minutes  Goal status: progressing   5.  Patient will demonstrate at least 4/5 Lt ankle strength to improve stability when navigating on uneven terrain.  Baseline: see above  Goal status: MET  6.  Patient will be independent with advanced home program to progress/maintain current level of function.  Baseline: initial HEP issued  Goal status: progressing    7.  Patient will tolerate rolling R and L without d/o dizziness.   Baseline: 4/10 dizzines 08/04/24: 4/10 dizziness baseline    Goal status: deferred due to 4/10 baseline dizzines     8. Patient will tolerate gaze x 1 viewing in sitting x 20 seconds to work towards improved tolerance to turns in standing.   Baseline: Unable to do 2 reps in sitting   08/04/24: VOR x 1, 20 seconds    Goal status: MET   9. Patient will score </= 40 % disability on the Oswestry Disability Index (MCID is 20%) to signify clinically meaningful improvement in functional abilities.    Baseline: see above   Goal status: NEW  10. Patient will demonstrate proper lifting mechanics to improve her tolerance to loading/unloading the dishwasher.    Baseline: increased pain, limits bending activity   Goal status: NEW  PLAN:  PT FREQUENCY: 2x/week  PT DURATION: 6 weeks  PLANNED INTERVENTIONS: 02835- PT Re-evaluation, 97750- Physical Performance  Testing, 97110-Therapeutic exercises, 97530- Therapeutic activity, [id number]- Neuromuscular re-education, [phone]- Self Care, 02859- Manual therapy, [id number]- Gait training, [phone]- Aquatic Therapy, [id number]- Vasopneumatic device, Taping, Dry Needling, Cryotherapy, and Moist heat  PLAN FOR NEXT SESSION: review and progress HEP; static balance; core/hip strengthening as tolerated; response to extension; focus on addressing back pain and treat dizziness prn.   Dhanvi Boesen, PT, DPT, ATC 09/10/24 11:25 AM

## 2024-09-10 NOTE — Patient Instructions (Signed)

## 2024-09-12 ENCOUNTER — Other Ambulatory Visit: Payer: Self-pay | Admitting: Family Medicine

## 2024-09-12 DIAGNOSIS — E785 Hyperlipidemia, unspecified: Secondary | ICD-10-CM

## 2024-09-12 DIAGNOSIS — I2510 Atherosclerotic heart disease of native coronary artery without angina pectoris: Secondary | ICD-10-CM

## 2024-09-15 ENCOUNTER — Ambulatory Visit

## 2024-09-15 DIAGNOSIS — M6281 Muscle weakness (generalized): Secondary | ICD-10-CM | POA: Diagnosis not present

## 2024-09-15 DIAGNOSIS — R2681 Unsteadiness on feet: Secondary | ICD-10-CM

## 2024-09-15 DIAGNOSIS — R2689 Other abnormalities of gait and mobility: Secondary | ICD-10-CM

## 2024-09-15 DIAGNOSIS — M5416 Radiculopathy, lumbar region: Secondary | ICD-10-CM

## 2024-09-15 NOTE — Therapy (Signed)
 OUTPATIENT PHYSICAL THERAPY TREATMENT    Patient Name: [name] MRN: [id number] DOB:October 31, 1946, 78 y.o., female Today's Date: 09/15/2024  END OF SESSION:  PT End of Session - 09/15/24 1024     Visit Number 24    Number of Visits 28    Date for Recertification  09/20/24    Authorization Type [employer]    Progress Note Due on Visit 30    PT Start Time 1024    PT Stop Time 1103    PT Time Calculation (min) 39 min    Activity Tolerance Patient tolerated treatment well    Behavior During Therapy [facility] for tasks assessed/performed            Past Medical History:  Diagnosis Date   Abdominal pain 10/26/2013   Allergic rhinitis 01/25/2016   ANA positive 07/29/2013   Anemia    Arthralgia 08/18/2013   Arthritis    Asthma, allergic 08/05/2012   Atrial flutter    past history- not current   Atypical chest pain 01/01/2015   Bilateral carpal tunnel syndrome 03/04/2015   CAD in native artery 08/25/2010   Minor CAD 2009, 2011, low risk Myoview  2013 and 2016        Cataract    bilaterally removed    Cellulitis of breast 02/05/2023   Chronic constipation    Chronic cough 01/09/2017   Chronic interstitial cystitis 02/01/2017   Chronic night sweats 01/08/2015   Common bile duct dilatation 02/13/2023   Concussion    x 3   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   DDD (degenerative disc disease), cervical 09/17/2018   Degenerative scoliosis 04/22/2018   Deviated septum 05/12/2016   Dysphagia    Dyspnea    Dysuria 03/09/2021   Elevated alkaline phosphatase level 01/25/2023   Essential hypertension 08/25/2010   External hemorrhoids 02/05/2015   Facial trauma 01/22/2023   Family history of adverse reaction to anesthesia    Family history of malignant hyperthermia    father had this   Fibrocystic breast disease 10/18/2010   Fibromyalgia    Frequency of urination    Generalized anxiety disorder 08/03/2010   GERD (gastroesophageal reflux disease)    Headache  10/13/2013   High serum vitamin D  05/18/2021   History of chronic bronchitis    History of colonic polyp 02/25/2019   History of hiatal hernia    History of tachycardia    CONTROLLED  WITH ATENOLOL    Hyperglycemia 03/08/2021   Hyperkalemia 09/28/2022   Hyperlipidemia    Intertrigo 12/13/2022   Kidney stone 02/26/2019   Knee pain, right 04/30/2015   Left hip pain 04/30/2015   Leg pain 02/24/2011   Qualifier: Diagnosis of   By: Antonio DOJamee         Leukocytosis 07/05/2022   Local reaction to hymenoptera sting 05/09/2023   Low back pain 09/08/2020   Major depressive disorder 02/05/2013   Malignant neoplasm of lower-outer quadrant of right breast of female, estrogen receptor positive 01/03/2013   Nasal turbinate hypertrophy 05/12/2016   Oral herpes 02/05/2023   OSA and COPD overlap syndrome 06/10/2020   Osteoporosis 01/2019   T score -2.2 stable/improved from prior study   Pelvic pain    Peripheral neuropathy 05/22/2014   Personal history of radiation therapy    Pneumonia    Primary insomnia 06/25/2017   Pulmonary nodules 02/12/2015   Rash and other nonspecific skin eruption 05/09/2023   Recurrent falls 05/02/2020   Recurrent sinusitis 02/25/2019   Right arm  pain 07/20/2016   RLS (restless legs syndrome) 11/09/2019   RUQ pain 07/05/2022   S/P radiation therapy 11/12/12 - 12/05/12   right Breast   Sciatica 04/30/2015   Sepsis 2014   from UTI    Sjogren's disease 12/12/2021   Splenic lesion 09/02/2012   Sprain of lateral ligament of ankle joint 07/31/2023   Stage 3 chronic kidney disease    Statin intolerance 02/29/2020   Stricture and stenosis of esophagus 10/19/2011   Tongue lesion 02/05/2023   Tremor 07/05/2022   Urgency of urination    Vertigo 01/22/2023   Vitamin B12 deficiency 03/08/2021   Vocal cord cyst 05/18/2021   Past Surgical History:  Procedure Laterality Date   24 HOUR PH STUDY N/A 04/03/2016   Procedure: 24 HOUR PH STUDY;  Surgeon: Elspeth Deward Naval, MD;  Location: [facility];  Service: Gastroenterology;  Laterality: N/A;   ANKLE ARTHROSCOPY WITH RECONSTRUCTION Left 03/12/2024   Procedure: ANKLE ARTHROSCOPY WITH OPEN LATERAL ANKLE LIGAMENT STABILIZATION;  Surgeon: Barton Drape, MD;  Location: [facility];  Service: Orthopedics;  Laterality: Left;   BREAST BIOPSY Right 08/23/2012   ADH   BREAST BIOPSY Right 10/11/2012   Ductal Carcinoma   BREAST EXCISIONAL BIOPSY Right 09/04/2013   benign   BREAST LUMPECTOMY Right 10/11/2012   W/ SLN BX   CARDIAC CATHETERIZATION  09-13-2007  DR MORRIS   WELL-PRESERVED LVF/  DIFFUSE SCATTERED CORONARY CALCIFACATION AND ATHEROSCLEROSIS WITHOUT OBSTRUCTION   CARDIAC CATHETERIZATION  08-04-2010  DR MCALHANY   NON-OBSTRUCTIVE CAD/  pLAD 40%/  oLAD 30%/  mLAD 30%/  pRCA 30%/  EF 60%   CARDIOVASCULAR STRESS TEST  06-18-2012  DR McALHANY   LOW RISK NUCLEAR STUDY/  SMALL FIXED AREA OF MODERATELY DECREASED UPTAKE IN ANTEROSEPTAL WALL WHICH MAY BE ARTIFACTUAL/  NO ISCHEMIA/  EF 68%   COLONOSCOPY  09/29/2010   CYSTOSCOPY     CYSTOSCOPY WITH HYDRODISTENSION AND BIOPSY N/A 03/06/2014   Procedure: CYSTOSCOPY/HYDRODISTENSION/ INSTILATION OF MARCAINE  AND PYRIDIUM ;  Surgeon: Arlena LILLETTE Gal, MD;  Location: [facility];  Service: Urology;  Laterality: N/A;   CYSTOSCOPY/URETEROSCOPY/HOLMIUM LASER/STENT PLACEMENT Left 03/21/2023   Procedure: CYSTOSCOPY LEFT RETROGRADE PYELOGRAM, LEFT URETEROSCOPY, HOLMIUM LASER LITHOTRIPSYM, AND LEFT URETERAL STENT PLACEMENT;  Surgeon: Devere Lonni Righter, MD;  Location: [facility];  Service: Urology;  Laterality: Left;  60 MINUTES   ESOPHAGEAL MANOMETRY N/A 04/03/2016   Procedure: ESOPHAGEAL MANOMETRY (EM);  Surgeon: Elspeth Deward Naval, MD;  Location: [facility];  Service: Gastroenterology;  Laterality: N/A;   EXTRACORPOREAL SHOCK WAVE LITHOTRIPSY Left 02/06/2019   Procedure: EXTRACORPOREAL SHOCK WAVE LITHOTRIPSY (ESWL);   Surgeon: Ottelin, Mark, MD;  Location: [facility];  Service: Urology;  Laterality: Left;   NASAL SINUS SURGERY  1985   ORIF RIGHT ANKLE  FX  2006   POLYPECTOMY     REMOVAL VOCAL CORD CYST  01/2013   REPAIR OF PERONEUS BREVIS TENDON Left 03/12/2024   Procedure: REPAIR OF PERONEUS TENDON;  Surgeon: Barton Drape, MD;  Location: [facility];  Service: Orthopedics;  Laterality: Left;   RIGHT BREAST BX  08/23/2012   RIGHT HAND SURGERY  X3  LAST ONE 2009   INCLUDES  ORIF RIGHT 5TH FINGER AND REVISION TWICE   SKIN CANCER EXCISION     face,arms   TONSILLECTOMY AND ADENOIDECTOMY  AGE 12   TOTAL ABDOMINAL HYSTERECTOMY W/ BILATERAL SALPINGOOPHORECTOMY  1982   W/  APPENDECTOMY   TRANSTHORACIC ECHOCARDIOGRAM  06/24/2012   GRADE I DIASTOLIC DYSFUNCTION/  EF 55-60%/  MILD MR   UPPER GASTROINTESTINAL ENDOSCOPY     Patient Active Problem List   Diagnosis Date Noted   Breast discharge 08/27/2024   Acute left ankle pain 08/14/2024   Pedal edema 05/20/2024   Otalgia of both ears 04/23/2024   Tinnitus of both ears 04/23/2024   Hypokalemia 12/10/2023   IDA (iron deficiency anemia) 09/12/2023   Stage 3 chronic kidney disease    Right ankle pain 07/31/2023   Common bile duct dilatation 02/13/2023   Oral herpes 02/05/2023   Elevated alkaline phosphatase level 01/25/2023   Vertigo 01/22/2023   Intertrigo 12/13/2022   Hyperkalemia 09/28/2022   Anemia 09/28/2022   Tremor 07/05/2022   RUQ pain 07/05/2022   Leukocytosis 07/05/2022   Sjogren's disease 12/12/2021   Vocal cord cyst 05/18/2021   Vitamin D  deficiency 05/18/2021   Dysuria 03/09/2021   Vitamin B12 deficiency 03/08/2021   Hyperglycemia 03/08/2021   OSA and COPD overlap syndrome 06/10/2020   Recurrent falls 05/02/2020   Statin intolerance 02/29/2020   RLS (restless legs syndrome) 11/09/2019   Recurrent sinusitis 02/25/2019   History of colonic polyp 02/25/2019   DDD (degenerative disc disease), cervical 09/17/2018    Degenerative scoliosis 04/22/2018   Primary insomnia 06/25/2017   Chronic interstitial cystitis 02/01/2017   Deviated nasal septum 05/12/2016   Hypertrophy of nasal turbinates 05/12/2016   Dysphagia    Allergic rhinitis 01/25/2016   Dyspnea    Sciatica 04/30/2015   Bilateral carpal tunnel syndrome 03/04/2015   Hyperlipidemia 03/01/2015   Pulmonary nodules 02/12/2015   External hemorrhoids 02/05/2015   Chronic night sweats 01/08/2015   Peripheral neuropathy 05/22/2014   Headache 10/13/2013   Arthralgia 08/18/2013   ANA positive 07/29/2013   Depression with anxiety 02/05/2013   Malignant neoplasm of lower-outer quadrant of right breast of female, estrogen receptor positive 01/03/2013   Asthma, allergic 08/05/2012   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   GERD (gastroesophageal reflux disease) 06/03/2012   Stricture and stenosis of esophagus 10/19/2011   Osteoporosis 04/17/2011   Fibrocystic breast disease 10/18/2010   Essential hypertension 08/25/2010   CAD in native artery 08/25/2010   Generalized anxiety disorder 08/03/2010   Bronchitis 03/10/2010   Fibromyalgia 01/11/2010   PCP: Domenica Harlene LABOR, MD  REFERRING PROVIDER: Barton Drape, MD (peroneal tendinitis)  Bonner Ade, MD (radiculopathy)   REFERRING DIAG: M76.72 (ICD-10-CM) - Peroneal tendinitis, left leg   M54.16 (ICD-10-CM) - Radiculopathy, lumbar region   THERAPY DIAG:  Muscle weakness (generalized)  Other abnormalities of gait and mobility  Unsteadiness on feet  Radiculopathy, lumbar region  Rationale for Evaluation and Treatment: Rehabilitation  ONSET DATE: 03/12/24  SUBJECTIVE:  SUBJECTIVE STATEMENT: Patient reports the kidney pain is gone. Continues with localized low back pain and doesn't appear to be radiating into the leg.   PERTINENT HISTORY: S/p Lt ankle arthroscopy Brostrom stabilization, peroneus brevis and longus debridement and tenosynovectomy 03/12/24 See extensive PMH above    PAIN:  Are you having pain? Yes: NPRS scale: 5/10 Pain location:  low back Pain description: sharp,nagging Aggravating factors: walking, standing,sitting Relieving factors: heat  PRECAUTIONS: Fall,progress per protocol; osteoporosis   WEIGHT BEARING RESTRICTIONS: No  FALLS:  Has patient fallen in last 6 months? Yes. Number of falls 2 (fell stepping up grassy hill, fell in her kitchen from turning quickly)  LIVING ENVIRONMENT: Lives with: lives with their spouse Lives in: House/apartment Stairs: Yes: Internal: 16 steps; on left going up Has following equipment at home: Single point cane  PATIENT  GOALS: I want to be able to walk again.  NEXT MD VISIT: November 2025   OBJECTIVE:  Note: Objective measures were completed at Evaluation unless otherwise noted.  DIAGNOSTIC FINDINGS: none on file   PATIENT SURVEYS:  FADI: 46/104; 44% 06/26/24: FADI: 67/100; 67%  08/11/24: modified oswestry 30/50; 60% disability   COGNITION: Overall cognitive status: Within functional limits for tasks assessed     SENSATION: See palpation  EDEMA:  Mild swelling about Lt ankle   PALPATION: Hypersensitivity to palpation about distal fibula, lateral malleolus  LUMBAR ROM:   Active  A/PROM  eval  Flexion 50% limited LBP and RLE pain   Extension WNL LBP  Right lateral flexion 25% limited  Left lateral flexion 25% limited   Right rotation WNL  Left rotation WNL   (Blank rows = not tested)  LOWER EXTREMITY ROM: Active ROM Right eval Left eval 05/05/24 Left  05/20/24 Left  06/03/24 Left   Hip flexion       Hip extension       Hip abduction       Hip adduction       Hip internal rotation       Hip external rotation       Knee flexion       Knee extension       Ankle dorsiflexion  6 9  10   Ankle plantarflexion  50  65 67  Ankle inversion  15  15 17   Ankle eversion  8  8 9    (Blank rows = not tested)  LOWER EXTREMITY MMT: MMT Right eval Left eval 06/26/24 Left   08/04/24 Left  08/11/24   Hip flexion     Rt: 4; Lt: 4-  Hip extension       Hip abduction     4 bilateral   Hip adduction       Hip internal rotation       Hip external rotation       Knee flexion       Knee extension       Ankle dorsiflexion  3+ 4+ 4+   Ankle plantarflexion  3+ sitting 4- in sitting 4 in sitting    Ankle inversion  3+ 4- 4   Ankle eversion  3+ 4 5    (Blank rows = not tested)  FUNCTIONAL TESTS:  TUG: 13.5 seconds Romberg x 30 seconds Semi-tandem unable   06/09/24: TUG: 11.1 seconds   06/26/24: tandem 1 second   08/11/24: Rt rolling requires use of UE support on the hip to roll, increased pain   GAIT: Distance walked: 20 ft  Assistive device utilized: Single point cane. Ankle brace Level of assistance: Modified independence Comments: limited push-off LLE, foot ER,  [facility] Adult PT Treatment:                                                DATE: 09/15/24 Therapeutic Exercise: Standing lumbar extension x 10  Reviewed HEP, frequency of repeated movement   Neuromuscular re-ed: Standing shoulder extension with TA activation 2 x 10; red band  Therapeutic Activity: Seated hip hinge x 10 Standing hip hinge at wall with foam roller x 10     [facility] Adult PT Treatment:  DATE: 09/10/24 Therapeutic Exercise: Standing lumbar extension 2 x 10  Prone lying x 3 minutes  Sciatic nerve glides LLE x 5  Updated HEP   Self Care: Educated patient on bending/lifting mechanics, posture, sleep positioning recommendations, avoiding trunk flexion activity with handout provided Modalities for pain control- ice, heat, TENS unit.  Educated on rationale of repeated movement for pain centralization    [facility] Adult PT Treatment:                                                DATE: 09/01/24 Therapeutic Exercise: Reviewed HEP recommending to discontinue core and hip strengthening currently, but continue to habituation exercises for vestibular  training  Self Care: pain neuroscience education  Discussed mindfulness and mediation with videos and handout added to HEP.    [facility] Adult PT Treatment:                                                DATE: 08/26/24 Therapeutic Exercise: Reviewed HEP, recommended to d/c core strengthening until f/u regarding kidney testing   Therapeutic Activity: VOR x 1 with checkered background, seated 90 BPM; 5 trials 20-30 sec  Habituation: nose to knee x 5 each  Seated ball toss 2 x 10  Self Care: F/u with nephrology regarding results and worsening symptoms    PATIENT EDUCATION:  Education details:see treatment  Person educated: Patient Education method: Explanation, handout  Education comprehension: verbalized understanding  HOME EXERCISE PROGRAM: Access Code: [email or web address] URL: [email or web address] Date: 09/10/2024 Prepared by: Lucie Meeter  Exercises - Seated Plantar Fascia Mobilization with Small Ball  - 1 x daily - 7 x weekly - 3-5 minute  hold - Seated Great Toe Extension  - 1 x daily - 7 x weekly - 2 sets - 10 reps - Long Sitting Ankle Plantar Flexion with Resistance  - 1 x daily - 7 x weekly - 2 sets - 10 reps - Long Sitting Ankle Inversion with Resistance  - 1 x daily - 7 x weekly - 2 sets - 10 reps - Toe Raises with Counter Support  - 1 x daily - 7 x weekly - 2 sets - 10 reps - Heel Raises with Counter Support  - 1 x daily - 7 x weekly - 2 sets - 10 reps - Towel Scrunches  - 1 x daily - 7 x weekly - 1 sets - 5 reps - Rolling rightleft sides for vestibular habituation  - 1-2 x daily - 7 x weekly - 1 sets - 10 reps - Seated Right Head Turns Vestibular Habituation  - 1-2 x daily - 7 x weekly - 1 sets - 5 reps - Standing Gaze Stabilization with Head Rotation  - 1 x daily - 7 x weekly - 2 sets - 10 reps - Standing Hip Abduction with Counter Support  - 1 x daily - 7 x weekly - 2 sets - 10 reps - Standing Romberg to 3/4 Tandem Stance  - 1 x daily - 7 x weekly - 3 sets - max   hold - Gastroc Stretch on Wall  - 1 x daily - 7 x weekly - 3 sets - 30 sec  hold - Hooklying Clamshell with Resistance  - 1  x daily - 7 x weekly - 2 sets - 10 reps - Supine Hip Adduction Isometric with Ball  - 1 x daily - 7 x weekly - 2 sets - 10 reps - Supine Transversus Abdominis Bracing - Hands on Stomach  - 1 x daily - 7 x weekly - 2 sets - 10 reps - 5 sec  hold - 5 Minute Guided Meditation  - 7 x weekly - 10 Minute Guided Meditation  - 7 x weekly - 15 Minute Guided Meditation  - 7 x weekly - 20 Minute Guided Meditation  - 7 x weekly - Standing Lumbar Extension  - 6 x daily - 7 x weekly - 1 sets - 10 reps - Lying Prone  - 2 x daily - 7 x weekly - 5-10 minutes  hold - Supine 90/90 Sciatic Nerve Glide with Knee Flexion/Extension  - 1 x daily - 7 x weekly - 1 sets - 10 reps  Patient Education - Treating Persistent Pain Without Medications: Mindfulness  ASSESSMENT:  CLINICAL IMPRESSION: Patient reports improvement and centralization of pain since last session. Reviewed repeated movement discussing frequency of this activity as part of HEP with patient verbalizing understanding. Emphasis on lifting mechanics today introducing hip hinge activity. She is able to complete hip hinge in sitting with proper form without onset of back pain. With standing hip hinge utilizing foam roller for tactile cues she has good form initially, though as she fatigues she begins to experience back pain and has more difficulty engaging her glutes.   EVAL: Patient is a 78 y.o. female who was seen today for physical therapy evaluation and treatment for s/p Lt ankle arthroscopy Brostrom stabilization, peroneus brevis and longus debridement and tenosynovectomy on 03/12/24. She demonstrates ROM, strength, gait and balance deficits that are consistent with her recent post-operative status. She will benefit from skilled PT to address the above stated deficits in order to return to optimal function.   OBJECTIVE IMPAIRMENTS:  Abnormal gait, decreased activity tolerance, decreased balance, decreased endurance, decreased knowledge of condition, decreased mobility, difficulty walking, decreased ROM, decreased strength, increased edema, impaired flexibility, impaired sensation, improper body mechanics, postural dysfunction, and pain.   GOALS: Goals reviewed with patient? Yes  SHORT TERM GOALS: Target date: 06/09/2024  Patient will be independent and compliant with initial HEP.  Baseline: issued at eval  Goal status: MET  2.  Patient will demonstrate at least 60 degrees of Lt ankle plantarflexion AROM to improve push-off during gait cycle.  Baseline: see above Goal status: MET  3.  Patient will demonstrate at least 10 degrees of Lt ankle dorsiflexion AROM to improve gait mechanics.  Baseline: see above Goal status: MET  4.  Patient will complete TUG in </= 12 seconds to reduce fall risk.  Baseline: see above Goal status: MET  LONG TERM GOALS: Target date: 09/20/24  Patient will score >/= 60/104 on the Foot and Ankle Disability Index (MDC 7) to signify clinically meaningful improvement in functional abilities.  Baseline: see above Goal status: MET  2.  Patient will maintain tandem stance for at least 10 seconds to improve gait stability.  Baseline: see above 06/26/24: tandem 1 second 08/04/24: tandem 4 second Goal status: progressing   3.  Patient will be modified independent with curb/stair negotiation.  Baseline: uses cane and requires support from spouse  06/26/24: requires cane  Goal status: MET  4.  Patient will self-report ability to tolerate at least 30 minutes of standing/walking activity.  Baseline: 10 minutes  06/26/24: 20 minutes  08/04/24: limited due to back pain and dizziness, 10 minutes  Goal status: progressing   5.  Patient will demonstrate at least 4/5 Lt ankle strength to improve stability when navigating on uneven terrain.  Baseline: see above  Goal status: MET  6.  Patient will be  independent with advanced home program to progress/maintain current level of function.  Baseline: initial HEP issued  Goal status: progressing    7. Patient will tolerate rolling R and L without d/o dizziness.   Baseline: 4/10 dizzines 08/04/24: 4/10 dizziness baseline    Goal status: deferred due to 4/10 baseline dizzines     8. Patient will tolerate gaze x 1 viewing in sitting x 20 seconds to work towards improved tolerance to turns in standing.   Baseline: Unable to do 2 reps in sitting   08/04/24: VOR x 1, 20 seconds    Goal status: MET   9. Patient will score </= 40 % disability on the Oswestry Disability Index (MCID is 20%) to signify clinically meaningful improvement in functional abilities.    Baseline: see above   Goal status: NEW  10. Patient will demonstrate proper lifting mechanics to improve her tolerance to loading/unloading the dishwasher.    Baseline: increased pain, limits bending activity   Goal status: NEW  PLAN:  PT FREQUENCY: 2x/week  PT DURATION: 6 weeks  PLANNED INTERVENTIONS: [id number]- PT Re-evaluation, 97750- Physical Performance Testing, 97110-Therapeutic exercises, 97530- Therapeutic activity, [id number]- Neuromuscular re-education, 97535- Self Care, [id number]- Manual therapy, [id number]- Gait training, [phone]- Aquatic Therapy, [id number]- Vasopneumatic device, Taping, Dry Needling, Cryotherapy, and Moist heat  PLAN FOR NEXT SESSION: review and progress HEP; static balance; core/hip strengthening as tolerated; response to extension; focus on addressing back pain and treat dizziness prn.   Marjean Imperato, PT, DPT, ATC 09/15/24 11:03 AM

## 2024-09-16 ENCOUNTER — Encounter: Payer: Self-pay | Admitting: Professional

## 2024-09-16 ENCOUNTER — Ambulatory Visit (INDEPENDENT_AMBULATORY_CARE_PROVIDER_SITE_OTHER): Admitting: Professional

## 2024-09-16 DIAGNOSIS — F4310 Post-traumatic stress disorder, unspecified: Secondary | ICD-10-CM

## 2024-09-16 DIAGNOSIS — F331 Major depressive disorder, recurrent, moderate: Secondary | ICD-10-CM | POA: Diagnosis not present

## 2024-09-16 DIAGNOSIS — F411 Generalized anxiety disorder: Secondary | ICD-10-CM | POA: Diagnosis not present

## 2024-09-16 NOTE — Telephone Encounter (Unsigned)
 Copied from CRM #[id number]. Topic: Appointments - Appointment Cancel/Reschedule >> Sep 16, 2024 11:47 AM Drema MATSU wrote: Patient would like to reschedule her appointment that is tomorrow with the pharmacist. She states tomorrow is not going to work for her.

## 2024-09-16 NOTE — Progress Notes (Signed)
   [name], [facility]

## 2024-09-16 NOTE — Progress Notes (Signed)
 [facility] Counselor/Therapist Progress Note  Patient ID: [name], MRN: [id number],    Date: 09/16/2024  Time Spent: 40 minutes 10-1040am   Treatment Type: Individual Therapy  Risk Assessment: Danger to Self:  No Self-injurious Behavior: No Danger to Others: No  Subjective: This session was held via video teletherapy. The patient consented to video teletherapy and was located in her home during this session. She is aware it is the responsibility of the patient to secure confidentiality on her end of the session. The provider was in a private home office for the duration of this session.    The patient arrived on time for the [name] appointment.   Issues addressed: 1-spirituality a-Charlie Omar death -cried for days -felt relieved on "Sunday when she watched his funeral -she feels overwhelmed by what's happening in the world -pt feels all the more pressure for her children -pt thinks now it will  be a positive thing -it's given her a little more oomph to spread the word of God b-prayer -pt prays for people at the moment when requested 2-daughters -[name]'s partner Donna sent picture of her daughter Taylor getting dressed up to go to church -they do not go to areas that cause strife -[name] talks to pt more often now that Kinsley is away at school -Shelly is having a good school year and has a number of students who are showing great promise -Shelly used to call daily with a negative attitude -pt addressed need  to move toward more positive conversations -her son Jimmy's wife Lisa she is sending cards -when taking with her son tells son to tell Lisa that she loves her too 3-physical a-not doing well b-kidney stone but thinks she passed, has been feeling better c-significant back pain with the degenerative disk disease, ostearthritis, scoliosis, osteoporosis -medicare won't pay for her injection before she leaves for the wedding -can normally sit  only for an hour; two hours is unbearable -she is back to using a walker due to the pain -she is in PT and working on back exercises  Treatment Plan Problems: anxiety, depression, family conflict Goals: Alleviate depressive symptoms Recognize, accept, and cope with depressive feelings Develop healthy thinking patterns Develop healthy interpersonal relationships (particularly younger daughter) Reduce overall frequency, intensity, and duration of anxiety Stabilize anxiety level while increasing ability to function Enhance ability to effectively cope with full variety of stressors Learn and implement coping skills that result in a reduction of anxiety   Objectives target date for all objectives is 09/26/2024: Verbalize an understanding of the cognitive, physiological, and behavioral components of anxiety                               60     75 Learning and implement calming skills to reduce overall anxiety                       60     75 Verbalize an understanding of the role that cognitive biases play in excessive irrational worry and persistent anxiety symptoms 70     85+ Identify, challenge, and replace biased fearful self-talk  60     80+ Learn and implement problem solving strategies   70     95 Identify and engage in pleasant activities    80     90"  Learning and implement personal and interpersonal skills to reduce anxiety and improve interpersonal relationships  60     85 Learn to accept limitations in life and commit to tolerating, rather than avoiding, unpleasant emotions while accomplishing meaningful goal 60 85 Identify major life conflicts from the past and present that form the basis for present anxiety      70 90+     Maintain involvement in work, family, and social activities 70    90     Reestablish a consistent sleep-wake cycle   70    75 Verbalize an accurate understanding of depression  60    85 Identify and replace thoughts that support depression  80    100 Learn and  implement behavioral strategies   60    85 Verbalize an understanding and resolution of current interpersonal problems       38    38 Learn and implement decision making skills   24    39 Learn and implement conflict resolution skills to resolve interpersonal problems       60    85 Verbalize an understanding of healthy and unhealthy emotions verbalize insight into how past relationships may be influence current experiences with depression                               60    80 Use mindfulness and acceptance strategies and increase value based behavior        60    80 Increase hopeful statements about the future.    60    85 Interventions: Engage the patient in behavioral activation Use instruction, modeling, and role-playing to build the patient 's general social, communication, and/or conflict resolution skills Use Acceptance and Commitment Therapy to help patient  accept uncomfortable realities in order to accomplish value-consistent goals Reinforce the patient 's insight into the role of her past emotional pain and present anxiety  Support the patient in following through with work, family, and social activities Teach and implement sleep hygiene practices  Teach the patient relaxation skills Assign the patient homework Discuss examples demonstrating that unrealistic worry overestimates the probability of threats and underestimates patient's ability  Assist the patient in analyzing his or her worries Help patient understand that avoidance is reinforcing  Consistent with treatment model, discuss how change in cognitive, behavioral, and interpersonal can help patient alleviate depression CBT Behavioral activation to help the patient explore the relationship, nature of the dispute Help the patient develop new interpersonal skills and relationships Conduct Problem solving therapy Teach conflict resolution skills Use a process-experiential approach Conduct ACT  Diagnosis:Major depressive  disorder, recurrent episode, moderate (HCC)  Generalized anxiety disorder  PTSD (post-traumatic stress disorder)  Plan:  -meet on Tuesday, September 30, 2024 at [employer].

## 2024-09-17 ENCOUNTER — Ambulatory Visit

## 2024-09-17 ENCOUNTER — Telehealth: Payer: Self-pay

## 2024-09-17 DIAGNOSIS — R2681 Unsteadiness on feet: Secondary | ICD-10-CM

## 2024-09-17 DIAGNOSIS — M6281 Muscle weakness (generalized): Secondary | ICD-10-CM

## 2024-09-17 DIAGNOSIS — R2689 Other abnormalities of gait and mobility: Secondary | ICD-10-CM

## 2024-09-17 DIAGNOSIS — M5416 Radiculopathy, lumbar region: Secondary | ICD-10-CM

## 2024-09-17 NOTE — Therapy (Signed)
 OUTPATIENT PHYSICAL THERAPY TREATMENT    Patient Name: LIZZY HAMRE MRN: [id number] DOB:13-Oct-1946, 78 y.o., female Today's Date: 09/17/2024  END OF SESSION:  PT End of Session - 09/17/24 1027     Visit Number 25    Number of Visits 28    Date for Recertification  09/20/24    Authorization Type [employer]    Progress Note Due on Visit 30    PT Start Time 1027   patient late   PT Stop Time 1059    PT Time Calculation (min) 32 min    Activity Tolerance Patient tolerated treatment well    Behavior During Therapy [facility] for tasks assessed/performed             Past Medical History:  Diagnosis Date   Abdominal pain 10/26/2013   Allergic rhinitis 01/25/2016   ANA positive 07/29/2013   Anemia    Arthralgia 08/18/2013   Arthritis    Asthma, allergic 08/05/2012   Atrial flutter    past history- not current   Atypical chest pain 01/01/2015   Bilateral carpal tunnel syndrome 03/04/2015   CAD in native artery 08/25/2010   Minor CAD 2009, 2011, low risk Myoview  2013 and 2016        Cataract    bilaterally removed    Cellulitis of breast 02/05/2023   Chronic constipation    Chronic cough 01/09/2017   Chronic interstitial cystitis 02/01/2017   Chronic night sweats 01/08/2015   Common bile duct dilatation 02/13/2023   Concussion    x 3   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   DDD (degenerative disc disease), cervical 09/17/2018   Degenerative scoliosis 04/22/2018   Deviated septum 05/12/2016   Dysphagia    Dyspnea    Dysuria 03/09/2021   Elevated alkaline phosphatase level 01/25/2023   Essential hypertension 08/25/2010   External hemorrhoids 02/05/2015   Facial trauma 01/22/2023   Family history of adverse reaction to anesthesia    Family history of malignant hyperthermia    father had this   Fibrocystic breast disease 10/18/2010   Fibromyalgia    Frequency of urination    Generalized anxiety disorder 08/03/2010   GERD (gastroesophageal reflux disease)     Headache 10/13/2013   High serum vitamin D  05/18/2021   History of chronic bronchitis    History of colonic polyp 02/25/2019   History of hiatal hernia    History of tachycardia    CONTROLLED  WITH ATENOLOL    Hyperglycemia 03/08/2021   Hyperkalemia 09/28/2022   Hyperlipidemia    Intertrigo 12/13/2022   Kidney stone 02/26/2019   Knee pain, right 04/30/2015   Left hip pain 04/30/2015   Leg pain 02/24/2011   Qualifier: Diagnosis of   By: Antonio DOJamee         Leukocytosis 07/05/2022   Local reaction to hymenoptera sting 05/09/2023   Low back pain 09/08/2020   Major depressive disorder 02/05/2013   Malignant neoplasm of lower-outer quadrant of right breast of female, estrogen receptor positive 01/03/2013   Nasal turbinate hypertrophy 05/12/2016   Oral herpes 02/05/2023   OSA and COPD overlap syndrome 06/10/2020   Osteoporosis 01/2019   T score -2.2 stable/improved from prior study   Pelvic pain    Peripheral neuropathy 05/22/2014   Personal history of radiation therapy    Pneumonia    Primary insomnia 06/25/2017   Pulmonary nodules 02/12/2015   Rash and other nonspecific skin eruption 05/09/2023   Recurrent falls 05/02/2020   Recurrent sinusitis 02/25/2019  Right arm pain 07/20/2016   RLS (restless legs syndrome) 11/09/2019   RUQ pain 07/05/2022   S/P radiation therapy 11/12/12 - 12/05/12   right Breast   Sciatica 04/30/2015   Sepsis 2014   from UTI    Sjogren's disease 12/12/2021   Splenic lesion 09/02/2012   Sprain of lateral ligament of ankle joint 07/31/2023   Stage 3 chronic kidney disease    Statin intolerance 02/29/2020   Stricture and stenosis of esophagus 10/19/2011   Tongue lesion 02/05/2023   Tremor 07/05/2022   Urgency of urination    Vertigo 01/22/2023   Vitamin B12 deficiency 03/08/2021   Vocal cord cyst 05/18/2021   Past Surgical History:  Procedure Laterality Date   24 HOUR PH STUDY N/A 04/03/2016   Procedure: 24 HOUR PH STUDY;  Surgeon:  Elspeth Deward Naval, MD;  Location: [facility];  Service: Gastroenterology;  Laterality: N/A;   ANKLE ARTHROSCOPY WITH RECONSTRUCTION Left 03/12/2024   Procedure: ANKLE ARTHROSCOPY WITH OPEN LATERAL ANKLE LIGAMENT STABILIZATION;  Surgeon: Barton Drape, MD;  Location: [facility];  Service: Orthopedics;  Laterality: Left;   BREAST BIOPSY Right 08/23/2012   ADH   BREAST BIOPSY Right 10/11/2012   Ductal Carcinoma   BREAST EXCISIONAL BIOPSY Right 09/04/2013   benign   BREAST LUMPECTOMY Right 10/11/2012   W/ SLN BX   CARDIAC CATHETERIZATION  09-13-2007  DR MORRIS   WELL-PRESERVED LVF/  DIFFUSE SCATTERED CORONARY CALCIFACATION AND ATHEROSCLEROSIS WITHOUT OBSTRUCTION   CARDIAC CATHETERIZATION  08-04-2010  DR MCALHANY   NON-OBSTRUCTIVE CAD/  pLAD 40%/  oLAD 30%/  mLAD 30%/  pRCA 30%/  EF 60%   CARDIOVASCULAR STRESS TEST  06-18-2012  DR McALHANY   LOW RISK NUCLEAR STUDY/  SMALL FIXED AREA OF MODERATELY DECREASED UPTAKE IN ANTEROSEPTAL WALL WHICH MAY BE ARTIFACTUAL/  NO ISCHEMIA/  EF 68%   COLONOSCOPY  09/29/2010   CYSTOSCOPY     CYSTOSCOPY WITH HYDRODISTENSION AND BIOPSY N/A 03/06/2014   Procedure: CYSTOSCOPY/HYDRODISTENSION/ INSTILATION OF MARCAINE  AND PYRIDIUM ;  Surgeon: Arlena LILLETTE Gal, MD;  Location: [facility];  Service: Urology;  Laterality: N/A;   CYSTOSCOPY/URETEROSCOPY/HOLMIUM LASER/STENT PLACEMENT Left 03/21/2023   Procedure: CYSTOSCOPY LEFT RETROGRADE PYELOGRAM, LEFT URETEROSCOPY, HOLMIUM LASER LITHOTRIPSYM, AND LEFT URETERAL STENT PLACEMENT;  Surgeon: Devere Lonni Righter, MD;  Location: [facility];  Service: Urology;  Laterality: Left;  60 MINUTES   ESOPHAGEAL MANOMETRY N/A 04/03/2016   Procedure: ESOPHAGEAL MANOMETRY (EM);  Surgeon: Elspeth Deward Naval, MD;  Location: [facility];  Service: Gastroenterology;  Laterality: N/A;   EXTRACORPOREAL SHOCK WAVE LITHOTRIPSY Left 02/06/2019   Procedure: EXTRACORPOREAL SHOCK WAVE LITHOTRIPSY  (ESWL);  Surgeon: Ottelin, Mark, MD;  Location: [facility];  Service: Urology;  Laterality: Left;   NASAL SINUS SURGERY  1985   ORIF RIGHT ANKLE  FX  2006   POLYPECTOMY     REMOVAL VOCAL CORD CYST  01/2013   REPAIR OF PERONEUS BREVIS TENDON Left 03/12/2024   Procedure: REPAIR OF PERONEUS TENDON;  Surgeon: Barton Drape, MD;  Location: [facility];  Service: Orthopedics;  Laterality: Left;   RIGHT BREAST BX  08/23/2012   RIGHT HAND SURGERY  X3  LAST ONE 2009   INCLUDES  ORIF RIGHT 5TH FINGER AND REVISION TWICE   SKIN CANCER EXCISION     face,arms   TONSILLECTOMY AND ADENOIDECTOMY  AGE 52   TOTAL ABDOMINAL HYSTERECTOMY W/ BILATERAL SALPINGOOPHORECTOMY  1982   W/  APPENDECTOMY   TRANSTHORACIC ECHOCARDIOGRAM  06/24/2012   GRADE I  DIASTOLIC DYSFUNCTION/  EF 55-60%/  MILD MR   UPPER GASTROINTESTINAL ENDOSCOPY     Patient Active Problem List   Diagnosis Date Noted   Breast discharge 08/27/2024   Acute left ankle pain 08/14/2024   Pedal edema 05/20/2024   Otalgia of both ears 04/23/2024   Tinnitus of both ears 04/23/2024   Hypokalemia 12/10/2023   IDA (iron deficiency anemia) 09/12/2023   Stage 3 chronic kidney disease    Right ankle pain 07/31/2023   Common bile duct dilatation 02/13/2023   Oral herpes 02/05/2023   Elevated alkaline phosphatase level 01/25/2023   Vertigo 01/22/2023   Intertrigo 12/13/2022   Hyperkalemia 09/28/2022   Anemia 09/28/2022   Tremor 07/05/2022   RUQ pain 07/05/2022   Leukocytosis 07/05/2022   Sjogren's disease 12/12/2021   Vocal cord cyst 05/18/2021   Vitamin D  deficiency 05/18/2021   Dysuria 03/09/2021   Vitamin B12 deficiency 03/08/2021   Hyperglycemia 03/08/2021   OSA and COPD overlap syndrome 06/10/2020   Recurrent falls 05/02/2020   Statin intolerance 02/29/2020   RLS (restless legs syndrome) 11/09/2019   Recurrent sinusitis 02/25/2019   History of colonic polyp 02/25/2019   DDD (degenerative disc disease), cervical 09/17/2018    Degenerative scoliosis 04/22/2018   Primary insomnia 06/25/2017   Chronic interstitial cystitis 02/01/2017   Deviated nasal septum 05/12/2016   Hypertrophy of nasal turbinates 05/12/2016   Dysphagia    Allergic rhinitis 01/25/2016   Dyspnea    Sciatica 04/30/2015   Bilateral carpal tunnel syndrome 03/04/2015   Hyperlipidemia 03/01/2015   Pulmonary nodules 02/12/2015   External hemorrhoids 02/05/2015   Chronic night sweats 01/08/2015   Peripheral neuropathy 05/22/2014   Headache 10/13/2013   Arthralgia 08/18/2013   ANA positive 07/29/2013   Depression with anxiety 02/05/2013   Malignant neoplasm of lower-outer quadrant of right breast of female, estrogen receptor positive 01/03/2013   Asthma, allergic 08/05/2012   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   GERD (gastroesophageal reflux disease) 06/03/2012   Stricture and stenosis of esophagus 10/19/2011   Osteoporosis 04/17/2011   Fibrocystic breast disease 10/18/2010   Essential hypertension 08/25/2010   CAD in native artery 08/25/2010   Generalized anxiety disorder 08/03/2010   Bronchitis 03/10/2010   Fibromyalgia 01/11/2010   PCP: Domenica Harlene LABOR, MD  REFERRING PROVIDER: Barton Drape, MD (peroneal tendinitis)  Bonner Ade, MD (radiculopathy)   REFERRING DIAG: M76.72 (ICD-10-CM) - Peroneal tendinitis, left leg   M54.16 (ICD-10-CM) - Radiculopathy, lumbar region   THERAPY DIAG:  Muscle weakness (generalized)  Other abnormalities of gait and mobility  Unsteadiness on feet  Radiculopathy, lumbar region  Rationale for Evaluation and Treatment: Rehabilitation  ONSET DATE: 03/12/24  SUBJECTIVE:  SUBJECTIVE STATEMENT: Patient reports she is feeling better. She can't have a back injection due to insurance purposes and doctor availability. She is taking 1 tramadol  in the morning and 1 at night. The pain is isolated to the low back.   PERTINENT HISTORY: S/p Lt ankle arthroscopy Brostrom stabilization, peroneus  brevis and longus debridement and tenosynovectomy 03/12/24 See extensive PMH above   PAIN:  Are you having pain? Yes: NPRS scale: 5/10 Pain location:  low back Pain description: stabbing Aggravating factors: walking, standing,sitting Relieving factors: heat  PRECAUTIONS: Fall,progress per protocol; osteoporosis   WEIGHT BEARING RESTRICTIONS: No  FALLS:  Has patient fallen in last 6 months? Yes. Number of falls 2 (fell stepping up grassy hill, fell in her kitchen from turning quickly)  LIVING ENVIRONMENT: Lives with: lives with their spouse Lives in:  House/apartment Stairs: Yes: Internal: 16 steps; on left going up Has following equipment at home: Single point cane  PATIENT GOALS: I want to be able to walk again.  NEXT MD VISIT: November 2025   OBJECTIVE:  Note: Objective measures were completed at Evaluation unless otherwise noted.  DIAGNOSTIC FINDINGS: none on file   PATIENT SURVEYS:  FADI: 46/104; 44% 06/26/24: FADI: 67/100; 67%  08/11/24: modified oswestry 30/50; 60% disability   COGNITION: Overall cognitive status: Within functional limits for tasks assessed     SENSATION: See palpation  EDEMA:  Mild swelling about Lt ankle   PALPATION: Hypersensitivity to palpation about distal fibula, lateral malleolus  LUMBAR ROM:   Active  A/PROM  eval  Flexion 50% limited LBP and RLE pain   Extension WNL LBP  Right lateral flexion 25% limited  Left lateral flexion 25% limited   Right rotation WNL  Left rotation WNL   (Blank rows = not tested)  LOWER EXTREMITY ROM: Active ROM Right eval Left eval 05/05/24 Left  05/20/24 Left  06/03/24 Left   Hip flexion       Hip extension       Hip abduction       Hip adduction       Hip internal rotation       Hip external rotation       Knee flexion       Knee extension       Ankle dorsiflexion  6 9  10   Ankle plantarflexion  50  65 67  Ankle inversion  15  15 17   Ankle eversion  8  8 9    (Blank rows = not  tested)  LOWER EXTREMITY MMT: MMT Right eval Left eval 06/26/24 Left  08/04/24 Left  08/11/24   Hip flexion     Rt: 4; Lt: 4-  Hip extension       Hip abduction     4 bilateral   Hip adduction       Hip internal rotation       Hip external rotation       Knee flexion       Knee extension       Ankle dorsiflexion  3+ 4+ 4+   Ankle plantarflexion  3+ sitting 4- in sitting 4 in sitting    Ankle inversion  3+ 4- 4   Ankle eversion  3+ 4 5    (Blank rows = not tested)  FUNCTIONAL TESTS:  TUG: 13.5 seconds Romberg x 30 seconds Semi-tandem unable   06/09/24: TUG: 11.1 seconds   06/26/24: tandem 1 second   08/11/24: Rt rolling requires use of UE support on the hip to roll, increased pain   GAIT: Distance walked: 20 ft  Assistive device utilized: Single point cane. Ankle brace Level of assistance: Modified independence Comments: limited push-off LLE, foot ER,  [facility] Adult PT Treatment:                                                DATE: 09/17/24 Therapeutic Exercise: Standing lumbar extension 2 x 10  Reviewed HEP- discussing centralization of pain as it relates to repeated movement  Therapeutic Activity: Hip hinge at wall with foam roller x 10   Self Care: Discussed use of TENS unit for pain modulation with recommendation to bring in her TENS unit at next visit  to discuss proper setup and application.  Reviewed bending/lifting mechanics as part of previously provided handout   [facility] Adult PT Treatment:                                                DATE: 09/15/24 Therapeutic Exercise: Standing lumbar extension x 10  Reviewed HEP, frequency of repeated movement   Neuromuscular re-ed: Standing shoulder extension with TA activation 2 x 10; red band  Therapeutic Activity: Seated hip hinge x 10 Standing hip hinge at wall with foam roller x 10     [facility] Adult PT Treatment:                                                DATE: 09/10/24 Therapeutic Exercise: Standing lumbar  extension 2 x 10  Prone lying x 3 minutes  Sciatic nerve glides LLE x 5  Updated HEP   Self Care: Educated patient on bending/lifting mechanics, posture, sleep positioning recommendations, avoiding trunk flexion activity with handout provided Modalities for pain control- ice, heat, TENS unit.  Educated on rationale of repeated movement for pain centralization    [facility] Adult PT Treatment:                                                DATE: 09/01/24 Therapeutic Exercise: Reviewed HEP recommending to discontinue core and hip strengthening currently, but continue to habituation exercises for vestibular training  Self Care: pain neuroscience education  Discussed mindfulness and mediation with videos and handout added to HEP.    [facility] Adult PT Treatment:                                                DATE: 08/26/24 Therapeutic Exercise: Reviewed HEP, recommended to d/c core strengthening until f/u regarding kidney testing   Therapeutic Activity: VOR x 1 with checkered background, seated 90 BPM; 5 trials 20-30 sec  Habituation: nose to knee x 5 each  Seated ball toss 2 x 10  Self Care: F/u with nephrology regarding results and worsening symptoms    PATIENT EDUCATION:  Education details:see treatment  Person educated: Patient Education method: Explanation,  Education comprehension: verbalized understanding  HOME EXERCISE PROGRAM: Access Code: [email or web address] URL: [email or web address] Date: 09/10/2024 Prepared by: Lucie Meeter  Exercises - Seated Plantar Fascia Mobilization with Small Ball  - 1 x daily - 7 x weekly - 3-5 minute  hold - Seated Great Toe Extension  - 1 x daily - 7 x weekly - 2 sets - 10 reps - Long Sitting Ankle Plantar Flexion with Resistance  - 1 x daily - 7 x weekly - 2 sets - 10 reps - Long Sitting Ankle Inversion with Resistance  - 1 x daily - 7 x weekly - 2 sets - 10 reps - Toe Raises with Counter Support  - 1 x daily - 7 x weekly - 2 sets - 10  reps - Heel Raises with Counter Support  -  1 x daily - 7 x weekly - 2 sets - 10 reps - Towel Scrunches  - 1 x daily - 7 x weekly - 1 sets - 5 reps - Rolling rightleft sides for vestibular habituation  - 1-2 x daily - 7 x weekly - 1 sets - 10 reps - Seated Right Head Turns Vestibular Habituation  - 1-2 x daily - 7 x weekly - 1 sets - 5 reps - Standing Gaze Stabilization with Head Rotation  - 1 x daily - 7 x weekly - 2 sets - 10 reps - Standing Hip Abduction with Counter Support  - 1 x daily - 7 x weekly - 2 sets - 10 reps - Standing Romberg to 3/4 Tandem Stance  - 1 x daily - 7 x weekly - 3 sets - max  hold - Gastroc Stretch on Wall  - 1 x daily - 7 x weekly - 3 sets - 30 sec  hold - Hooklying Clamshell with Resistance  - 1 x daily - 7 x weekly - 2 sets - 10 reps - Supine Hip Adduction Isometric with Ball  - 1 x daily - 7 x weekly - 2 sets - 10 reps - Supine Transversus Abdominis Bracing - Hands on Stomach  - 1 x daily - 7 x weekly - 2 sets - 10 reps - 5 sec  hold - 5 Minute Guided Meditation  - 7 x weekly - 10 Minute Guided Meditation  - 7 x weekly - 15 Minute Guided Meditation  - 7 x weekly - 20 Minute Guided Meditation  - 7 x weekly - Standing Lumbar Extension  - 6 x daily - 7 x weekly - 1 sets - 10 reps - Lying Prone  - 2 x daily - 7 x weekly - 5-10 minutes  hold - Supine 90/90 Sciatic Nerve Glide with Knee Flexion/Extension  - 1 x daily - 7 x weekly - 1 sets - 10 reps  Patient Education - Treating Persistent Pain Without Medications: Mindfulness  ASSESSMENT:  CLINICAL IMPRESSION: Session somewhat limited as patient late for scheduled visit. She continues to respond positively to extension biased movement. We discussed use of TENS unit for pain modulation with patient having plans to bring her own device at next visit to go through proper setup/application. Continued with lifting mechanics with patient requiring cues for proper hip hinge and noted an increase in back pain towards end of  prescribed reps. Pain was reduced with standing lumbar extension. Time spent reviewing bending and lifting mechanics with household tasks with patient verbalizing understanding.   EVAL: Patient is a 78 y.o. female who was seen today for physical therapy evaluation and treatment for s/p Lt ankle arthroscopy Brostrom stabilization, peroneus brevis and longus debridement and tenosynovectomy on 03/12/24. She demonstrates ROM, strength, gait and balance deficits that are consistent with her recent post-operative status. She will benefit from skilled PT to address the above stated deficits in order to return to optimal function.   OBJECTIVE IMPAIRMENTS: Abnormal gait, decreased activity tolerance, decreased balance, decreased endurance, decreased knowledge of condition, decreased mobility, difficulty walking, decreased ROM, decreased strength, increased edema, impaired flexibility, impaired sensation, improper body mechanics, postural dysfunction, and pain.   GOALS: Goals reviewed with patient? Yes  SHORT TERM GOALS: Target date: 06/09/2024  Patient will be independent and compliant with initial HEP.  Baseline: issued at eval  Goal status: MET  2.  Patient will demonstrate at least 60 degrees of Lt ankle plantarflexion AROM to improve push-off  during gait cycle.  Baseline: see above Goal status: MET  3.  Patient will demonstrate at least 10 degrees of Lt ankle dorsiflexion AROM to improve gait mechanics.  Baseline: see above Goal status: MET  4.  Patient will complete TUG in </= 12 seconds to reduce fall risk.  Baseline: see above Goal status: MET  LONG TERM GOALS: Target date: 09/20/24  Patient will score >/= 60/104 on the Foot and Ankle Disability Index (MDC 7) to signify clinically meaningful improvement in functional abilities.  Baseline: see above Goal status: MET  2.  Patient will maintain tandem stance for at least 10 seconds to improve gait stability.  Baseline: see above 06/26/24:  tandem 1 second 08/04/24: tandem 4 second Goal status: progressing   3.  Patient will be modified independent with curb/stair negotiation.  Baseline: uses cane and requires support from spouse  06/26/24: requires cane  Goal status: MET  4.  Patient will self-report ability to tolerate at least 30 minutes of standing/walking activity.  Baseline: 10 minutes  06/26/24: 20 minutes  08/04/24: limited due to back pain and dizziness, 10 minutes  Goal status: progressing   5.  Patient will demonstrate at least 4/5 Lt ankle strength to improve stability when navigating on uneven terrain.  Baseline: see above  Goal status: MET  6.  Patient will be independent with advanced home program to progress/maintain current level of function.  Baseline: initial HEP issued  Goal status: progressing    7. Patient will tolerate rolling R and L without d/o dizziness.   Baseline: 4/10 dizzines 08/04/24: 4/10 dizziness baseline    Goal status: deferred due to 4/10 baseline dizzines     8. Patient will tolerate gaze x 1 viewing in sitting x 20 seconds to work towards improved tolerance to turns in standing.   Baseline: Unable to do 2 reps in sitting   08/04/24: VOR x 1, 20 seconds    Goal status: MET   9. Patient will score </= 40 % disability on the Oswestry Disability Index (MCID is 20%) to signify clinically meaningful improvement in functional abilities.    Baseline: see above   Goal status: NEW  10. Patient will demonstrate proper lifting mechanics to improve her tolerance to loading/unloading the dishwasher.    Baseline: increased pain, limits bending activity   Goal status: NEW  PLAN:  PT FREQUENCY: 2x/week  PT DURATION: 6 weeks  PLANNED INTERVENTIONS: [id number]- PT Re-evaluation, 97750- Physical Performance Testing, 97110-Therapeutic exercises, 97530- Therapeutic activity, [id number]- Neuromuscular re-education, 97535- Self Care, [id number]- Manual therapy, [id number]- Gait training, [phone]- Aquatic Therapy, [id number]-  Vasopneumatic device, Taping, Dry Needling, Cryotherapy, and Moist heat  PLAN FOR NEXT SESSION: review and progress HEP; static balance; core/hip strengthening as tolerated; response to extension; focus on addressing back pain and treat dizziness prn. RE-CERT TO CONTINUE FOR BACK.   Tiani Stanbery, PT, DPT, ATC 09/17/24 11:01 AM

## 2024-09-22 ENCOUNTER — Encounter: Admitting: Rehabilitative and Restorative Service Providers"

## 2024-09-22 ENCOUNTER — Encounter: Payer: Self-pay | Admitting: Rehabilitative and Restorative Service Providers"

## 2024-09-22 ENCOUNTER — Ambulatory Visit: Admitting: Rehabilitative and Restorative Service Providers"

## 2024-09-22 DIAGNOSIS — M6281 Muscle weakness (generalized): Secondary | ICD-10-CM | POA: Diagnosis not present

## 2024-09-22 DIAGNOSIS — M25521 Pain in right elbow: Secondary | ICD-10-CM | POA: Insufficient documentation

## 2024-09-22 DIAGNOSIS — R2689 Other abnormalities of gait and mobility: Secondary | ICD-10-CM

## 2024-09-22 DIAGNOSIS — R2681 Unsteadiness on feet: Secondary | ICD-10-CM

## 2024-09-22 NOTE — Therapy (Unsigned)
 OUTPATIENT PHYSICAL THERAPY TREATMENT AND RECERTIFICATION   Patient Name: [name] MRN: [id number] DOB:12-16-1946, 78 y.o., female Today's Date: 09/23/2024  END OF SESSION:  PT End of Session - 09/22/24 1018     Visit Number 26    Number of Visits 38    Date for Recertification  11/21/24    Authorization Type [employer]    Progress Note Due on Visit 30    PT Start Time 1020    PT Stop Time 1100    PT Time Calculation (min) 40 min    Activity Tolerance Patient tolerated treatment well    Behavior During Therapy [facility] for tasks assessed/performed          Past Medical History:  Diagnosis Date   Abdominal pain 10/26/2013   Allergic rhinitis 01/25/2016   ANA positive 07/29/2013   Anemia    Arthralgia 08/18/2013   Arthritis    Asthma, allergic 08/05/2012   Atrial flutter    past history- not current   Atypical chest pain 01/01/2015   Bilateral carpal tunnel syndrome 03/04/2015   CAD in native artery 08/25/2010   Minor CAD 2009, 2011, low risk Myoview  2013 and 2016        Cataract    bilaterally removed    Cellulitis of breast 02/05/2023   Chronic constipation    Chronic cough 01/09/2017   Chronic interstitial cystitis 02/01/2017   Chronic night sweats 01/08/2015   Common bile duct dilatation 02/13/2023   Concussion    x 3   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   DDD (degenerative disc disease), cervical 09/17/2018   Degenerative scoliosis 04/22/2018   Deviated septum 05/12/2016   Dysphagia    Dyspnea    Dysuria 03/09/2021   Elevated alkaline phosphatase level 01/25/2023   Essential hypertension 08/25/2010   External hemorrhoids 02/05/2015   Facial trauma 01/22/2023   Family history of adverse reaction to anesthesia    Family history of malignant hyperthermia    father had this   Fibrocystic breast disease 10/18/2010   Fibromyalgia    Frequency of urination    Generalized anxiety disorder 08/03/2010   GERD (gastroesophageal reflux disease)     Headache 10/13/2013   High serum vitamin D  05/18/2021   History of chronic bronchitis    History of colonic polyp 02/25/2019   History of hiatal hernia    History of tachycardia    CONTROLLED  WITH ATENOLOL    Hyperglycemia 03/08/2021   Hyperkalemia 09/28/2022   Hyperlipidemia    Intertrigo 12/13/2022   Kidney stone 02/26/2019   Knee pain, right 04/30/2015   Left hip pain 04/30/2015   Leg pain 02/24/2011   Qualifier: Diagnosis of   By: Antonio DOJamee         Leukocytosis 07/05/2022   Local reaction to hymenoptera sting 05/09/2023   Low back pain 09/08/2020   Major depressive disorder 02/05/2013   Malignant neoplasm of lower-outer quadrant of right breast of female, estrogen receptor positive 01/03/2013   Nasal turbinate hypertrophy 05/12/2016   Oral herpes 02/05/2023   OSA and COPD overlap syndrome 06/10/2020   Osteoporosis 01/2019   T score -2.2 stable/improved from prior study   Pelvic pain    Peripheral neuropathy 05/22/2014   Personal history of radiation therapy    Pneumonia    Primary insomnia 06/25/2017   Pulmonary nodules 02/12/2015   Rash and other nonspecific skin eruption 05/09/2023   Recurrent falls 05/02/2020   Recurrent sinusitis 02/25/2019   Right arm pain  07/20/2016   RLS (restless legs syndrome) 11/09/2019   RUQ pain 07/05/2022   S/P radiation therapy 11/12/12 - 12/05/12   right Breast   Sciatica 04/30/2015   Sepsis 2014   from UTI    Sjogren's disease 12/12/2021   Splenic lesion 09/02/2012   Sprain of lateral ligament of ankle joint 07/31/2023   Stage 3 chronic kidney disease    Statin intolerance 02/29/2020   Stricture and stenosis of esophagus 10/19/2011   Tongue lesion 02/05/2023   Tremor 07/05/2022   Urgency of urination    Vertigo 01/22/2023   Vitamin B12 deficiency 03/08/2021   Vocal cord cyst 05/18/2021   Past Surgical History:  Procedure Laterality Date   24 HOUR PH STUDY N/A 04/03/2016   Procedure: 24 HOUR PH STUDY;  Surgeon:  Elspeth Deward Naval, MD;  Location: [facility];  Service: Gastroenterology;  Laterality: N/A;   ANKLE ARTHROSCOPY WITH RECONSTRUCTION Left 03/12/2024   Procedure: ANKLE ARTHROSCOPY WITH OPEN LATERAL ANKLE LIGAMENT STABILIZATION;  Surgeon: Barton Drape, MD;  Location: [facility];  Service: Orthopedics;  Laterality: Left;   BREAST BIOPSY Right 08/23/2012   ADH   BREAST BIOPSY Right 10/11/2012   Ductal Carcinoma   BREAST EXCISIONAL BIOPSY Right 09/04/2013   benign   BREAST LUMPECTOMY Right 10/11/2012   W/ SLN BX   CARDIAC CATHETERIZATION  09-13-2007  DR MORRIS   WELL-PRESERVED LVF/  DIFFUSE SCATTERED CORONARY CALCIFACATION AND ATHEROSCLEROSIS WITHOUT OBSTRUCTION   CARDIAC CATHETERIZATION  08-04-2010  DR MCALHANY   NON-OBSTRUCTIVE CAD/  pLAD 40%/  oLAD 30%/  mLAD 30%/  pRCA 30%/  EF 60%   CARDIOVASCULAR STRESS TEST  06-18-2012  DR McALHANY   LOW RISK NUCLEAR STUDY/  SMALL FIXED AREA OF MODERATELY DECREASED UPTAKE IN ANTEROSEPTAL WALL WHICH MAY BE ARTIFACTUAL/  NO ISCHEMIA/  EF 68%   COLONOSCOPY  09/29/2010   CYSTOSCOPY     CYSTOSCOPY WITH HYDRODISTENSION AND BIOPSY N/A 03/06/2014   Procedure: CYSTOSCOPY/HYDRODISTENSION/ INSTILATION OF MARCAINE  AND PYRIDIUM ;  Surgeon: Arlena LILLETTE Gal, MD;  Location: [facility];  Service: Urology;  Laterality: N/A;   CYSTOSCOPY/URETEROSCOPY/HOLMIUM LASER/STENT PLACEMENT Left 03/21/2023   Procedure: CYSTOSCOPY LEFT RETROGRADE PYELOGRAM, LEFT URETEROSCOPY, HOLMIUM LASER LITHOTRIPSYM, AND LEFT URETERAL STENT PLACEMENT;  Surgeon: Devere Lonni Righter, MD;  Location: [facility];  Service: Urology;  Laterality: Left;  60 MINUTES   ESOPHAGEAL MANOMETRY N/A 04/03/2016   Procedure: ESOPHAGEAL MANOMETRY (EM);  Surgeon: Elspeth Deward Naval, MD;  Location: [facility];  Service: Gastroenterology;  Laterality: N/A;   EXTRACORPOREAL SHOCK WAVE LITHOTRIPSY Left 02/06/2019   Procedure: EXTRACORPOREAL SHOCK WAVE LITHOTRIPSY  (ESWL);  Surgeon: Ottelin, Mark, MD;  Location: [facility];  Service: Urology;  Laterality: Left;   NASAL SINUS SURGERY  1985   ORIF RIGHT ANKLE  FX  2006   POLYPECTOMY     REMOVAL VOCAL CORD CYST  01/2013   REPAIR OF PERONEUS BREVIS TENDON Left 03/12/2024   Procedure: REPAIR OF PERONEUS TENDON;  Surgeon: Barton Drape, MD;  Location: [facility];  Service: Orthopedics;  Laterality: Left;   RIGHT BREAST BX  08/23/2012   RIGHT HAND SURGERY  X3  LAST ONE 2009   INCLUDES  ORIF RIGHT 5TH FINGER AND REVISION TWICE   SKIN CANCER EXCISION     face,arms   TONSILLECTOMY AND ADENOIDECTOMY  AGE 52   TOTAL ABDOMINAL HYSTERECTOMY W/ BILATERAL SALPINGOOPHORECTOMY  1982   W/  APPENDECTOMY   TRANSTHORACIC ECHOCARDIOGRAM  06/24/2012   GRADE I DIASTOLIC DYSFUNCTION/  EF 55-60%/  MILD MR   UPPER GASTROINTESTINAL ENDOSCOPY     Patient Active Problem List   Diagnosis Date Noted   Breast discharge 08/27/2024   Acute left ankle pain 08/14/2024   Pedal edema 05/20/2024   Otalgia of both ears 04/23/2024   Tinnitus of both ears 04/23/2024   Hypokalemia 12/10/2023   IDA (iron deficiency anemia) 09/12/2023   Stage 3 chronic kidney disease    Right ankle pain 07/31/2023   Common bile duct dilatation 02/13/2023   Oral herpes 02/05/2023   Elevated alkaline phosphatase level 01/25/2023   Vertigo 01/22/2023   Intertrigo 12/13/2022   Hyperkalemia 09/28/2022   Anemia 09/28/2022   Tremor 07/05/2022   RUQ pain 07/05/2022   Leukocytosis 07/05/2022   Sjogren's disease 12/12/2021   Vocal cord cyst 05/18/2021   Vitamin D  deficiency 05/18/2021   Dysuria 03/09/2021   Vitamin B12 deficiency 03/08/2021   Hyperglycemia 03/08/2021   OSA and COPD overlap syndrome 06/10/2020   Recurrent falls 05/02/2020   Statin intolerance 02/29/2020   RLS (restless legs syndrome) 11/09/2019   Recurrent sinusitis 02/25/2019   History of colonic polyp 02/25/2019   DDD (degenerative disc disease), cervical 09/17/2018    Degenerative scoliosis 04/22/2018   Primary insomnia 06/25/2017   Chronic interstitial cystitis 02/01/2017   Deviated nasal septum 05/12/2016   Hypertrophy of nasal turbinates 05/12/2016   Dysphagia    Allergic rhinitis 01/25/2016   Dyspnea    Sciatica 04/30/2015   Bilateral carpal tunnel syndrome 03/04/2015   Hyperlipidemia 03/01/2015   Pulmonary nodules 02/12/2015   External hemorrhoids 02/05/2015   Chronic night sweats 01/08/2015   Peripheral neuropathy 05/22/2014   Headache 10/13/2013   Arthralgia 08/18/2013   ANA positive 07/29/2013   Depression with anxiety 02/05/2013   Malignant neoplasm of lower-outer quadrant of right breast of female, estrogen receptor positive 01/03/2013   Asthma, allergic 08/05/2012   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   GERD (gastroesophageal reflux disease) 06/03/2012   Stricture and stenosis of esophagus 10/19/2011   Osteoporosis 04/17/2011   Fibrocystic breast disease 10/18/2010   Essential hypertension 08/25/2010   CAD in native artery 08/25/2010   Generalized anxiety disorder 08/03/2010   Bronchitis 03/10/2010   Fibromyalgia 01/11/2010   PCP: Domenica Harlene LABOR, MD  REFERRING PROVIDER: Barton Drape, MD (peroneal tendinitis)  Bonner Ade, MD (radiculopathy)   REFERRING DIAG: M76.72 (ICD-10-CM) - Peroneal tendinitis, left leg   M54.16 (ICD-10-CM) - Radiculopathy, lumbar region   THERAPY DIAG:  Muscle weakness (generalized)  Other abnormalities of gait and mobility  Unsteadiness on feet  Rationale for Evaluation and Treatment: Rehabilitation  ONSET DATE: 03/12/24  SUBJECTIVE:  SUBJECTIVE STATEMENT: The patient reports that she is leaving 10/7 for a wedding. She wanted a back injection prior to leaving, however it is not covered by insurance. She is planning on driving and stopping on the way to reduce prolonged sitting.  She continues taking tramadol  in the morning and at night. She has neuropathy in both feet and has L foot  pain when touching it along the lateral ankle and border of the foot. She is still using her shower chair because of mild dizziness in the shower. She is still getting some buring during urination and will call her MD to discuss prior to her trip. She feels low back pain that is 5-6/10 in intensity. She is more active. She is still limited in bending over to pick up items. She is going to get a new reacher today and a small laundry  basket b/c this task is challenging at home.   PERTINENT HISTORY: S/p Lt ankle arthroscopy Brostrom stabilization, peroneus brevis and longus debridement and tenosynovectomy 03/12/24 See extensive PMH above   PAIN:  Are you having pain? Yes: NPRS scale:  5-6/10 Pain location:  low back Pain description: stabbing Aggravating factors: walking, standing,sitting Relieving factors: heat, lying flat   Are you having pain? Yes: NPRS scale: 0/10 at rest, hurts with WB at time sand pushing on lateral ankle Pain location: lateral aspect of left ankle Pain description: sore Aggravating factors: pressure, WB Relieving factors: unsure  PRECAUTIONS: Fall,progress per protocol; osteoporosis   WEIGHT BEARING RESTRICTIONS: No  FALLS:  Has patient fallen in last 6 months? Yes. Number of falls 2 (fell stepping up grassy hill, fell in her kitchen from turning quickly)  LIVING ENVIRONMENT: Lives with: lives with their spouse Lives in: House/apartment Stairs: Yes: Internal: 16 steps; on left going up Has following equipment at home: Single point cane  PATIENT GOALS: I want to be able to walk again.  NEXT MD VISIT: November 2025   OBJECTIVE:  Note: Objective measures were completed at Evaluation unless otherwise noted.  DIAGNOSTIC FINDINGS: none on file   PATIENT SURVEYS:  FADI: 46/104; 44% 06/26/24: FADI: 67/100; 67%  08/11/24: modified oswestry 30/50; 60% disability   COGNITION: Overall cognitive status: Within functional limits for tasks  assessed     SENSATION: See palpation  EDEMA:  Mild swelling about Lt ankle   PALPATION: Hypersensitivity to palpation about distal fibula, lateral malleolus   LUMBAR ROM:  Active  A/PROM  eval  Flexion 50% limited LBP and RLE pain   Extension WNL LBP  Right lateral flexion 25% limited  Left lateral flexion 25% limited   Right rotation WNL  Left rotation WNL   (Blank rows = not tested)  LOWER EXTREMITY ROM: Active ROM Right eval Left eval 05/05/24 Left  05/20/24 Left  06/03/24 Left   Hip flexion       Hip extension       Hip abduction       Hip adduction       Hip internal rotation       Hip external rotation       Knee flexion       Knee extension       Ankle dorsiflexion  6 9  10   Ankle plantarflexion  50  65 67  Ankle inversion  15  15 17   Ankle eversion  8  8 9    (Blank rows = not tested)  LOWER EXTREMITY MMT: MMT Right eval Left eval 06/26/24 Left  08/04/24 Left  08/11/24   Hip flexion     Rt: 4; Lt: 4-  Hip extension       Hip abduction     4 bilateral   Hip adduction       Hip internal rotation       Hip external rotation       Knee flexion       Knee extension       Ankle dorsiflexion  3+ 4+ 4+   Ankle plantarflexion  3+ sitting 4- in sitting 4 in sitting    Ankle inversion  3+ 4- 4   Ankle eversion  3+ 4 5    (Blank rows = not tested)  FUNCTIONAL TESTS:  TUG: 13.5 seconds Romberg x 30 seconds Semi-tandem unable   06/09/24: TUG: 11.1 seconds   06/26/24: tandem 1 second   08/11/24: Rt rolling  requires use of UE support on the hip to roll, increased pain   GAIT: Distance walked: 20 ft  Assistive device utilized: Single point cane. Ankle brace Level of assistance: Modified independence Comments: limited push-off LLE, foot ER,    [facility] Adult PT Treatment:                                                DATE: 09/22/24 PHYSICAL THERAPY RE-EVALUATION: Modified disability = 54% (was 60%) Palpation of L lateral ankle with pain that shoots along  border of foot 5 time sit<>stand=22.37 seconds today Pain is present in low back during sit<>supine Discussed daily activities around her home and what is limited== patient notes feeling greater unsteadiness today-- this can vary; she also has intermittent L foot pain and low back pain.  Therapeutic Exercise Toe extension alternating big toe and other 4 digits (toe yoga) Used a ball under the ball of her foot to add toe spreading as toe yoga activities-- unable today Neuromuscular re-ed: Tandem=1 second R and L-- difficulty dec'ing UE support today Standing narrow base of support is challenging today with patient reaching for hand held support Rolling R<>L without dizziness    [facility] Adult PT Treatment:                                                DATE: 09/17/24 Therapeutic Exercise: Standing lumbar extension 2 x 10  Reviewed HEP- discussing centralization of pain as it relates to repeated movement  Therapeutic Activity: Hip hinge at wall with foam roller x 10   Self Care: Discussed use of TENS unit for pain modulation with recommendation to bring in her TENS unit at next visit to discuss proper setup and application.  Reviewed bending/lifting mechanics as part of previously provided handout   [facility] Adult PT Treatment:                                                DATE: 09/15/24 Therapeutic Exercise: Standing lumbar extension x 10  Reviewed HEP, frequency of repeated movement   Neuromuscular re-ed: Standing shoulder extension with TA activation 2 x 10; red band  Therapeutic Activity: Seated hip hinge x 10 Standing hip hinge at wall with foam roller x 10     [facility] Adult PT Treatment:                                                DATE: 09/10/24 Therapeutic Exercise: Standing lumbar extension 2 x 10  Prone lying x 3 minutes  Sciatic nerve glides LLE x 5  Updated HEP   Self Care: Educated patient on bending/lifting mechanics, posture, sleep positioning recommendations,  avoiding trunk flexion activity with handout provided Modalities for pain control- ice, heat, TENS unit.  Educated on rationale of repeated movement for pain centralization    PATIENT EDUCATION:  Education details:see treatment  Person educated: Patient Education method: Explanation,  Education comprehension: verbalized understanding  HOME EXERCISE PROGRAM:  Access Code: [email or web address] URL: [email or web address] Date: 09/10/2024 Prepared by: Lucie Meeter  Exercises - Seated Plantar Fascia Mobilization with Small Ball  - 1 x daily - 7 x weekly - 3-5 minute  hold - Seated Great Toe Extension  - 1 x daily - 7 x weekly - 2 sets - 10 reps - Long Sitting Ankle Plantar Flexion with Resistance  - 1 x daily - 7 x weekly - 2 sets - 10 reps - Long Sitting Ankle Inversion with Resistance  - 1 x daily - 7 x weekly - 2 sets - 10 reps - Toe Raises with Counter Support  - 1 x daily - 7 x weekly - 2 sets - 10 reps - Heel Raises with Counter Support  - 1 x daily - 7 x weekly - 2 sets - 10 reps - Towel Scrunches  - 1 x daily - 7 x weekly - 1 sets - 5 reps - Rolling rightleft sides for vestibular habituation  - 1-2 x daily - 7 x weekly - 1 sets - 10 reps - Seated Right Head Turns Vestibular Habituation  - 1-2 x daily - 7 x weekly - 1 sets - 5 reps - Standing Gaze Stabilization with Head Rotation  - 1 x daily - 7 x weekly - 2 sets - 10 reps - Standing Hip Abduction with Counter Support  - 1 x daily - 7 x weekly - 2 sets - 10 reps - Standing Romberg to 3/4 Tandem Stance  - 1 x daily - 7 x weekly - 3 sets - max  hold - Gastroc Stretch on Wall  - 1 x daily - 7 x weekly - 3 sets - 30 sec  hold - Hooklying Clamshell with Resistance  - 1 x daily - 7 x weekly - 2 sets - 10 reps - Supine Hip Adduction Isometric with Ball  - 1 x daily - 7 x weekly - 2 sets - 10 reps - Supine Transversus Abdominis Bracing - Hands on Stomach  - 1 x daily - 7 x weekly - 2 sets - 10 reps - 5 sec  hold - 5 Minute Guided  Meditation  - 7 x weekly - 10 Minute Guided Meditation  - 7 x weekly - 15 Minute Guided Meditation  - 7 x weekly - 20 Minute Guided Meditation  - 7 x weekly - Standing Lumbar Extension  - 6 x daily - 7 x weekly - 1 sets - 10 reps - Lying Prone  - 2 x daily - 7 x weekly - 5-10 minutes  hold - Supine 90/90 Sciatic Nerve Glide with Knee Flexion/Extension  - 1 x daily - 7 x weekly - 1 sets - 10 reps  Patient Education - Treating Persistent Pain Without Medications: Mindfulness  ASSESSMENT:  CLINICAL IMPRESSION: The patient has met 5/10 LTGs. PT updated goals to continue for the next 8 weeks-- she is traveling for a wedding and will miss a portion of that time due to travel. PT continuing to emphasize management of low back, functional strength training, and management of L foot pain. Patient is managing dizziness well at this time with HEP.  Patient to bring TENS unit and shoes for next session to practice use of TENS for car ride and wearing shoes for an upcoming family wedding.  EVAL: Patient is a 78 y.o. female who was seen today for physical therapy evaluation and treatment for s/p Lt ankle arthroscopy Brostrom stabilization, peroneus brevis and longus debridement and tenosynovectomy on  03/12/24. She demonstrates ROM, strength, gait and balance deficits that are consistent with her recent post-operative status. She will benefit from skilled PT to address the above stated deficits in order to return to optimal function.   OBJECTIVE IMPAIRMENTS: Abnormal gait, decreased activity tolerance, decreased balance, decreased endurance, decreased knowledge of condition, decreased mobility, difficulty walking, decreased ROM, decreased strength, increased edema, impaired flexibility, impaired sensation, improper body mechanics, postural dysfunction, and pain.   GOALS: Goals reviewed with patient? Yes  LONG TERM GOALS: Target date: 09/20/24  Patient will score >/= 60/104 on the Foot and Ankle Disability  Index (MDC 7) to signify clinically meaningful improvement in functional abilities.  Baseline: see above Goal status: MET  2.  Patient will maintain tandem stance for at least 10 seconds to improve gait stability.  Baseline: see above 06/26/24: tandem 1 second 08/04/24: tandem 4 second Goal status:NOT MET-- 1 second on 09/23/24  3.  Patient will be modified independent with curb/stair negotiation.  Baseline: uses cane and requires support from spouse  06/26/24: requires cane  Goal status: MET  4.  Patient will self-report ability to tolerate at least 30 minutes of standing/walking activity.  Baseline: 10 minutes  06/26/24: 20 minutes  08/04/24: limited due to back pain and dizziness, 10 minutes  Goal status: NOT MET  5.  Patient will demonstrate at least 4/5 Lt ankle strength to improve stability when navigating on uneven terrain.  Baseline: see above  Goal status: MET  6.  Patient will be independent with advanced home program to progress/maintain current level of function.  Baseline: initial HEP issued  Goal status: MET   7. Patient will tolerate rolling R and L without d/o dizziness.   Baseline: 4/10 dizzines 08/04/24: 4/10 dizziness baseline    Goal status: MET 09/22/24: baseline dizziness today is 1/10  (began again this weekend) Rolling is so minute I don't think it's dizziness   8. Patient will tolerate gaze x 1 viewing in sitting x 20 seconds to work towards improved tolerance to turns in standing.   Baseline: Unable to do 2 reps in sitting   08/04/24: VOR x 1, 20 seconds    Goal status: MET   9. Patient will score </= 40 % disability on the Oswestry Disability Index (MCID is 20%) to signify clinically meaningful improvement in functional abilities.    Baseline: see above   Goal status: NOT MET-- 54%  10. Patient will demonstrate proper lifting mechanics to improve her tolerance to loading/unloading the dishwasher.    Baseline: increased pain, limits bending activity   Goal  status: NOT MET-- this task is still challenging at home  UPDATED GOALS:  SHORT TERM GOALS: Target date: 10/22/24  Patient will demonstrate use of TENS unit for home to manage chronic low back pain. Baseline: to bring next visit Goal status: NEW GOAL  2.  Patient will improve 5 time sit<>stand to < or equal to 18 seconds.  Baseline: 22.53 seconds Goal status: NEW GOAL  LONG TERM GOALS: Target date: 11/22/24  Patient will be indep with HEP progression.  Baseline: has HEP will need progression Goal status: UPDATED GOAL DATE  2.  Patient will maintain tandem stance for at least 10 seconds to improve gait stability.  Baseline: 1 second on 09/23/24 Goal status: UPDATED GOAL DATE  3.  Patient will score </= 40 % disability on the Oswestry Disability Index (MCID is 20%) to signify clinically meaningful improvement in functional abilities.   Baseline: see above  Goal status:  UPDATED GOAL DATE (54% on 09/22/24)  4.  Patient will self-report ability to tolerate at least 30 minutes of standing/walking activity.  Baseline: 10 minutes  06/26/24: 20 minutes  08/04/24: limited due to back pain and dizziness, 10 minutes; also unable on 09/23/24 Goal status: UPDATED GOAL DATE  PLAN:  PT FREQUENCY: 2x/week  PT DURATION: 6 weeks  PLANNED INTERVENTIONS: [id number]- PT Re-evaluation, 97750- Physical Performance Testing, 97110-Therapeutic exercises, 97530- Therapeutic activity, 97112- Neuromuscular re-education, 97535- Self Care, [id number]- Manual therapy, [id number]- Gait training, [phone]- Aquatic Therapy, [id number]- Vasopneumatic device, Taping, Dry Needling, Cryotherapy, and Moist heat  PLAN FOR NEXT SESSION: review and progress HEP; static balance; core/hip strengthening as tolerated; response to extension; focus on addressing back pain, teach self scar tissue massage for lateral ankle discomfort.   Crew Goren, PT 09/23/24 9:17 AM

## 2024-09-23 DIAGNOSIS — M25522 Pain in left elbow: Secondary | ICD-10-CM | POA: Diagnosis not present

## 2024-09-23 DIAGNOSIS — M25521 Pain in right elbow: Secondary | ICD-10-CM | POA: Diagnosis not present

## 2024-09-24 ENCOUNTER — Encounter: Payer: Self-pay | Admitting: Professional

## 2024-09-24 ENCOUNTER — Ambulatory Visit: Attending: Physical Medicine and Rehabilitation

## 2024-09-24 ENCOUNTER — Ambulatory Visit (INDEPENDENT_AMBULATORY_CARE_PROVIDER_SITE_OTHER): Admitting: Professional

## 2024-09-24 DIAGNOSIS — R2681 Unsteadiness on feet: Secondary | ICD-10-CM | POA: Insufficient documentation

## 2024-09-24 DIAGNOSIS — F331 Major depressive disorder, recurrent, moderate: Secondary | ICD-10-CM

## 2024-09-24 DIAGNOSIS — F4310 Post-traumatic stress disorder, unspecified: Secondary | ICD-10-CM

## 2024-09-24 DIAGNOSIS — F411 Generalized anxiety disorder: Secondary | ICD-10-CM | POA: Diagnosis not present

## 2024-09-24 DIAGNOSIS — R2689 Other abnormalities of gait and mobility: Secondary | ICD-10-CM | POA: Diagnosis not present

## 2024-09-24 DIAGNOSIS — M6281 Muscle weakness (generalized): Secondary | ICD-10-CM | POA: Insufficient documentation

## 2024-09-24 NOTE — Progress Notes (Signed)
 [facility] Counselor/Therapist Progress Note  Patient ID: [name], MRN: [id number],    Date: 09/24/2024  Time Spent: 41 minutes [id number]   Treatment Type: Individual Therapy  Risk Assessment: Danger to Self:  No Self-injurious Behavior: No Danger to Others: No  Subjective: This session was held via video teletherapy. The patient consented to video teletherapy and was located in her home during this session. She is aware it is the responsibility of the patient to secure confidentiality on her end of the session. The provider was in a private home office for the duration of this session.    The patient arrived on time for the [name] appointment.   Issues addressed: 1-family a-11th anniversary of mother's death yesterday -pt got through with no issue -she did not even miss a beat -she and sister talked but mother wasn't even mentioned b-holidays are hard -Thanksgiving is hard 2-focus on positives -leads to sweeter interactions with people -reminding herself to thinks positively 3-wedding -leaving for wedding in [place]  -planning for long day -will be away for nine days -both of [name]'s boys have places at the beach and may spend some time at the beach -going to get to see Carley, youngest daughter who is six months pregnant  Treatment Plan Problems: anxiety, depression, family conflict Goals: Alleviate depressive symptoms Recognize, accept, and cope with depressive feelings Develop healthy thinking patterns Develop healthy interpersonal relationships (particularly younger daughter) Reduce overall frequency, intensity, and duration of anxiety Stabilize anxiety level while increasing ability to function Enhance ability to effectively cope with full variety of stressors Learn and implement coping skills that result in a reduction of anxiety   Objectives target date for all objectives is 09/26/2024: Verbalize an understanding of the cognitive,  physiological, and behavioral components of anxiety                               60     75 Learning and implement calming skills to reduce overall anxiety                       60     75 Verbalize an understanding of the role that cognitive biases play in excessive irrational worry and persistent anxiety symptoms 70     85+ Identify, challenge, and replace biased fearful self-talk  60     80+ Learn and implement problem solving strategies   70     95 Identify and engage in pleasant activities    80     90 Learning and implement personal and interpersonal skills to reduce anxiety and improve interpersonal relationships   60     85 Learn to accept limitations in life and commit to tolerating, rather than avoiding, unpleasant emotions while accomplishing meaningful goal 60 85 Identify major life conflicts from the past and present that form the basis for present anxiety      70 90+     Maintain involvement in work, family, and social activities 70    90     Reestablish a consistent sleep-wake cycle   70    75 Verbalize an accurate understanding of depression  60    85 Identify and replace thoughts that support depression  80    100 Learn and implement behavioral strategies   60    85 Verbalize an understanding and resolution of current interpersonal problems       60  40 Learn and implement decision making skills   60    42 Learn and implement conflict resolution skills to resolve interpersonal problems       60    85 Verbalize an understanding of healthy and unhealthy emotions verbalize insight into how past relationships may be influence current experiences with depression                               60    80 Use mindfulness and acceptance strategies and increase value based behavior        60    80 Increase hopeful statements about the future.    60    85 Interventions: Engage the patient in behavioral activation Use instruction, modeling, and role-playing to build the patient 's general  social, communication, and/or conflict resolution skills Use Acceptance and Commitment Therapy to help patient  accept uncomfortable realities in order to accomplish value-consistent goals Reinforce the patient 's insight into the role of her past emotional pain and present anxiety  Support the patient in following through with work, family, and social activities Teach and implement sleep hygiene practices  Teach the patient relaxation skills Assign the patient homework Discuss examples demonstrating that unrealistic worry overestimates the probability of threats and underestimates patient's ability  Assist the patient in analyzing his or her worries Help patient understand that avoidance is reinforcing  Consistent with treatment model, discuss how change in cognitive, behavioral, and interpersonal can help patient alleviate depression CBT Behavioral activation to help the patient explore the relationship, nature of the dispute Help the patient develop new interpersonal skills and relationships Conduct Problem solving therapy Teach conflict resolution skills Use a process-experiential approach Conduct ACT  Diagnosis:Major depressive disorder, recurrent episode, moderate (HCC)  Generalized anxiety disorder  PTSD (post-traumatic stress disorder)  Plan:  -meet on Wednesday, October 22, 2024 at [employer].

## 2024-09-24 NOTE — Therapy (Addendum)
 OUTPATIENT PHYSICAL THERAPY TREATMENT    Patient Name: LOISE ESGUERRA MRN: [id number] DOB:1946/07/15, 78 y.o., female Today's Date: 09/24/2024  END OF SESSION:  PT End of Session - 09/24/24 1539     Visit Number 27    Number of Visits 38    Date for Recertification  11/21/24    Authorization Type [employer]    Authorization - Visit Number 27    Progress Note Due on Visit 30    PT Start Time 0250    PT Stop Time 0320    PT Time Calculation (min) 30 min    Activity Tolerance Patient limited by pain;Patient limited by lethargy    Behavior During Therapy [facility] for tasks assessed/performed           Past Medical History:  Diagnosis Date   Abdominal pain 10/26/2013   Allergic rhinitis 01/25/2016   ANA positive 07/29/2013   Anemia    Arthralgia 08/18/2013   Arthritis    Asthma, allergic 08/05/2012   Atrial flutter    past history- not current   Atypical chest pain 01/01/2015   Bilateral carpal tunnel syndrome 03/04/2015   CAD in native artery 08/25/2010   Minor CAD 2009, 2011, low risk Myoview  2013 and 2016        Cataract    bilaterally removed    Cellulitis of breast 02/05/2023   Chronic constipation    Chronic cough 01/09/2017   Chronic interstitial cystitis 02/01/2017   Chronic night sweats 01/08/2015   Common bile duct dilatation 02/13/2023   Concussion    x 3   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   DDD (degenerative disc disease), cervical 09/17/2018   Degenerative scoliosis 04/22/2018   Deviated septum 05/12/2016   Dysphagia    Dyspnea    Dysuria 03/09/2021   Elevated alkaline phosphatase level 01/25/2023   Essential hypertension 08/25/2010   External hemorrhoids 02/05/2015   Facial trauma 01/22/2023   Family history of adverse reaction to anesthesia    Family history of malignant hyperthermia    father had this   Fibrocystic breast disease 10/18/2010   Fibromyalgia    Frequency of urination    Generalized anxiety disorder 08/03/2010   GERD  (gastroesophageal reflux disease)    Headache 10/13/2013   High serum vitamin D  05/18/2021   History of chronic bronchitis    History of colonic polyp 02/25/2019   History of hiatal hernia    History of tachycardia    CONTROLLED  WITH ATENOLOL    Hyperglycemia 03/08/2021   Hyperkalemia 09/28/2022   Hyperlipidemia    Intertrigo 12/13/2022   Kidney stone 02/26/2019   Knee pain, right 04/30/2015   Left hip pain 04/30/2015   Leg pain 02/24/2011   Qualifier: Diagnosis of   By: Antonio DOJamee         Leukocytosis 07/05/2022   Local reaction to hymenoptera sting 05/09/2023   Low back pain 09/08/2020   Major depressive disorder 02/05/2013   Malignant neoplasm of lower-outer quadrant of right breast of female, estrogen receptor positive 01/03/2013   Nasal turbinate hypertrophy 05/12/2016   Oral herpes 02/05/2023   OSA and COPD overlap syndrome 06/10/2020   Osteoporosis 01/2019   T score -2.2 stable/improved from prior study   Pelvic pain    Peripheral neuropathy 05/22/2014   Personal history of radiation therapy    Pneumonia    Primary insomnia 06/25/2017   Pulmonary nodules 02/12/2015   Rash and other nonspecific skin eruption 05/09/2023   Recurrent falls  05/02/2020   Recurrent sinusitis 02/25/2019   Right arm pain 07/20/2016   RLS (restless legs syndrome) 11/09/2019   RUQ pain 07/05/2022   S/P radiation therapy 11/12/12 - 12/05/12   right Breast   Sciatica 04/30/2015   Sepsis 2014   from UTI    Sjogren's disease 12/12/2021   Splenic lesion 09/02/2012   Sprain of lateral ligament of ankle joint 07/31/2023   Stage 3 chronic kidney disease    Statin intolerance 02/29/2020   Stricture and stenosis of esophagus 10/19/2011   Tongue lesion 02/05/2023   Tremor 07/05/2022   Urgency of urination    Vertigo 01/22/2023   Vitamin B12 deficiency 03/08/2021   Vocal cord cyst 05/18/2021   Past Surgical History:  Procedure Laterality Date   24 HOUR PH STUDY N/A 04/03/2016    Procedure: 24 HOUR PH STUDY;  Surgeon: Elspeth Deward Naval, MD;  Location: [facility];  Service: Gastroenterology;  Laterality: N/A;   ANKLE ARTHROSCOPY WITH RECONSTRUCTION Left 03/12/2024   Procedure: ANKLE ARTHROSCOPY WITH OPEN LATERAL ANKLE LIGAMENT STABILIZATION;  Surgeon: Barton Drape, MD;  Location: [facility];  Service: Orthopedics;  Laterality: Left;   BREAST BIOPSY Right 08/23/2012   ADH   BREAST BIOPSY Right 10/11/2012   Ductal Carcinoma   BREAST EXCISIONAL BIOPSY Right 09/04/2013   benign   BREAST LUMPECTOMY Right 10/11/2012   W/ SLN BX   CARDIAC CATHETERIZATION  09-13-2007  DR MORRIS   WELL-PRESERVED LVF/  DIFFUSE SCATTERED CORONARY CALCIFACATION AND ATHEROSCLEROSIS WITHOUT OBSTRUCTION   CARDIAC CATHETERIZATION  08-04-2010  DR MCALHANY   NON-OBSTRUCTIVE CAD/  pLAD 40%/  oLAD 30%/  mLAD 30%/  pRCA 30%/  EF 60%   CARDIOVASCULAR STRESS TEST  06-18-2012  DR McALHANY   LOW RISK NUCLEAR STUDY/  SMALL FIXED AREA OF MODERATELY DECREASED UPTAKE IN ANTEROSEPTAL WALL WHICH MAY BE ARTIFACTUAL/  NO ISCHEMIA/  EF 68%   COLONOSCOPY  09/29/2010   CYSTOSCOPY     CYSTOSCOPY WITH HYDRODISTENSION AND BIOPSY N/A 03/06/2014   Procedure: CYSTOSCOPY/HYDRODISTENSION/ INSTILATION OF MARCAINE  AND PYRIDIUM ;  Surgeon: Arlena LILLETTE Gal, MD;  Location: [facility];  Service: Urology;  Laterality: N/A;   CYSTOSCOPY/URETEROSCOPY/HOLMIUM LASER/STENT PLACEMENT Left 03/21/2023   Procedure: CYSTOSCOPY LEFT RETROGRADE PYELOGRAM, LEFT URETEROSCOPY, HOLMIUM LASER LITHOTRIPSYM, AND LEFT URETERAL STENT PLACEMENT;  Surgeon: Devere Lonni Righter, MD;  Location: [facility];  Service: Urology;  Laterality: Left;  60 MINUTES   ESOPHAGEAL MANOMETRY N/A 04/03/2016   Procedure: ESOPHAGEAL MANOMETRY (EM);  Surgeon: Elspeth Deward Naval, MD;  Location: [facility];  Service: Gastroenterology;  Laterality: N/A;   EXTRACORPOREAL SHOCK WAVE LITHOTRIPSY Left 02/06/2019   Procedure:  EXTRACORPOREAL SHOCK WAVE LITHOTRIPSY (ESWL);  Surgeon: Ottelin, Mark, MD;  Location: [facility];  Service: Urology;  Laterality: Left;   NASAL SINUS SURGERY  1985   ORIF RIGHT ANKLE  FX  2006   POLYPECTOMY     REMOVAL VOCAL CORD CYST  01/2013   REPAIR OF PERONEUS BREVIS TENDON Left 03/12/2024   Procedure: REPAIR OF PERONEUS TENDON;  Surgeon: Barton Drape, MD;  Location: [facility];  Service: Orthopedics;  Laterality: Left;   RIGHT BREAST BX  08/23/2012   RIGHT HAND SURGERY  X3  LAST ONE 2009   INCLUDES  ORIF RIGHT 5TH FINGER AND REVISION TWICE   SKIN CANCER EXCISION     face,arms   TONSILLECTOMY AND ADENOIDECTOMY  AGE 47   TOTAL ABDOMINAL HYSTERECTOMY W/ BILATERAL SALPINGOOPHORECTOMY  1982   W/  APPENDECTOMY  TRANSTHORACIC ECHOCARDIOGRAM  06/24/2012   GRADE I DIASTOLIC DYSFUNCTION/  EF 55-60%/  MILD MR   UPPER GASTROINTESTINAL ENDOSCOPY     Patient Active Problem List   Diagnosis Date Noted   Breast discharge 08/27/2024   Acute left ankle pain 08/14/2024   Pedal edema 05/20/2024   Otalgia of both ears 04/23/2024   Tinnitus of both ears 04/23/2024   Hypokalemia 12/10/2023   IDA (iron deficiency anemia) 09/12/2023   Stage 3 chronic kidney disease    Right ankle pain 07/31/2023   Common bile duct dilatation 02/13/2023   Oral herpes 02/05/2023   Elevated alkaline phosphatase level 01/25/2023   Vertigo 01/22/2023   Intertrigo 12/13/2022   Hyperkalemia 09/28/2022   Anemia 09/28/2022   Tremor 07/05/2022   RUQ pain 07/05/2022   Leukocytosis 07/05/2022   Sjogren's disease 12/12/2021   Vocal cord cyst 05/18/2021   Vitamin D  deficiency 05/18/2021   Dysuria 03/09/2021   Vitamin B12 deficiency 03/08/2021   Hyperglycemia 03/08/2021   OSA and COPD overlap syndrome 06/10/2020   Recurrent falls 05/02/2020   Statin intolerance 02/29/2020   RLS (restless legs syndrome) 11/09/2019   Recurrent sinusitis 02/25/2019   History of colonic polyp 02/25/2019   DDD  (degenerative disc disease), cervical 09/17/2018   Degenerative scoliosis 04/22/2018   Primary insomnia 06/25/2017   Chronic interstitial cystitis 02/01/2017   Deviated nasal septum 05/12/2016   Hypertrophy of nasal turbinates 05/12/2016   Dysphagia    Allergic rhinitis 01/25/2016   Dyspnea    Sciatica 04/30/2015   Bilateral carpal tunnel syndrome 03/04/2015   Hyperlipidemia 03/01/2015   Pulmonary nodules 02/12/2015   External hemorrhoids 02/05/2015   Chronic night sweats 01/08/2015   Peripheral neuropathy 05/22/2014   Headache 10/13/2013   Arthralgia 08/18/2013   ANA positive 07/29/2013   Depression with anxiety 02/05/2013   Malignant neoplasm of lower-outer quadrant of right breast of female, estrogen receptor positive 01/03/2013   Asthma, allergic 08/05/2012   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   GERD (gastroesophageal reflux disease) 06/03/2012   Stricture and stenosis of esophagus 10/19/2011   Osteoporosis 04/17/2011   Fibrocystic breast disease 10/18/2010   Essential hypertension 08/25/2010   CAD in native artery 08/25/2010   Generalized anxiety disorder 08/03/2010   Bronchitis 03/10/2010   Fibromyalgia 01/11/2010   PCP: Domenica Harlene LABOR, MD  REFERRING PROVIDER: Barton Drape, MD (peroneal tendinitis)  Bonner Ade, MD (radiculopathy)   REFERRING DIAG: M76.72 (ICD-10-CM) - Peroneal tendinitis, left leg   M54.16 (ICD-10-CM) - Radiculopathy, lumbar region   THERAPY DIAG:  Muscle weakness (generalized)  Unsteadiness on feet  Other abnormalities of gait and mobility  Rationale for Evaluation and Treatment: Rehabilitation  ONSET DATE: 03/12/24  SUBJECTIVE:  SUBJECTIVE STATEMENT:  Pt reports baking banana bread for her husband today, and admits she stood for too long and is tired for therapy. Her back is hurting  more today.     PERTINENT HISTORY: S/p Lt ankle arthroscopy Brostrom stabilization, peroneus brevis and longus debridement and tenosynovectomy  03/12/24 See extensive PMH above   PAIN:  Are you having pain? Yes: NPRS scale:  9/10 Pain location:  low/mid back Pain description: stabbing Aggravating factors: walking, standing,sitting Relieving factors: heat, lying flat   Are you having pain? Yes: NPRS scale: 0/10 at rest, hurts with WB at time sand pushing on lateral ankle Pain location: lateral aspect of left ankle Pain description: sore Aggravating factors: pressure, WB Relieving factors: unsure  PRECAUTIONS: Fall,progress per protocol; osteoporosis   WEIGHT BEARING RESTRICTIONS: No  FALLS:  Has patient fallen in last 6 months? Yes. Number of falls 2 (fell stepping up grassy hill, fell in her kitchen from turning quickly)  LIVING ENVIRONMENT: Lives with: lives with their spouse Lives in: House/apartment Stairs: Yes: Internal: 16 steps; on left going up Has following equipment at home: Single point cane  PATIENT GOALS: I want to be able to walk again.  NEXT MD VISIT: November 2025   OBJECTIVE:  Note: Objective measures were completed at Evaluation unless otherwise noted.  DIAGNOSTIC FINDINGS: none on file   PATIENT SURVEYS:  FADI: 46/104; 44% 06/26/24: FADI: 67/100; 67%  08/11/24: modified oswestry 30/50; 60% disability   COGNITION: Overall cognitive status: Within functional limits for tasks assessed     SENSATION: See palpation  EDEMA:  Mild swelling about Lt ankle   PALPATION: Hypersensitivity to palpation about distal fibula, lateral malleolus   LUMBAR ROM:  Active  A/PROM  eval  Flexion 50% limited LBP and RLE pain   Extension WNL LBP  Right lateral flexion 25% limited  Left lateral flexion 25% limited   Right rotation WNL  Left rotation WNL   (Blank rows = not tested)  LOWER EXTREMITY ROM: Active ROM Right eval Left eval 05/05/24 Left  05/20/24 Left  06/03/24 Left   Hip flexion       Hip extension       Hip abduction       Hip adduction       Hip internal rotation       Hip  external rotation       Knee flexion       Knee extension       Ankle dorsiflexion  6 9  10   Ankle plantarflexion  50  65 67  Ankle inversion  15  15 17   Ankle eversion  8  8 9    (Blank rows = not tested)  LOWER EXTREMITY MMT: MMT Right eval Left eval 06/26/24 Left  08/04/24 Left  08/11/24   Hip flexion     Rt: 4; Lt: 4-  Hip extension       Hip abduction     4 bilateral   Hip adduction       Hip internal rotation       Hip external rotation       Knee flexion       Knee extension       Ankle dorsiflexion  3+ 4+ 4+   Ankle plantarflexion  3+ sitting 4- in sitting 4 in sitting    Ankle inversion  3+ 4- 4   Ankle eversion  3+ 4 5    (Blank rows = not tested)  FUNCTIONAL TESTS:  TUG: 13.5 seconds Romberg x 30 seconds Semi-tandem unable   06/09/24: TUG: 11.1 seconds   06/26/24: tandem 1 second   08/11/24: Rt rolling requires use of UE support on the hip to roll, increased pain   GAIT: Distance walked: 20 ft  Assistive device utilized: Single point cane. Ankle brace Level of assistance: Modified independence Comments: limited push-off LLE, foot ER,   [facility] Adult PT Treatment:                                                DATE: 09/24/24 Therapeutic Exercise: Back extensions attempted however pt could not complete  Neuromuscular re-ed: Adductor squeeze with ball between  knees x20 Ball press squeeze for TA activation in seated x8  Self Care: Educated on use of TENS and how it can be used for self pain management at home Recommended  urology follow up    [facility] Adult PT Treatment:                                                DATE: 09/22/24 PHYSICAL THERAPY RE-EVALUATION: Modified disability = 54% (was 60%) Palpation of L lateral ankle with pain that shoots along border of foot 5 time sit<>stand=22.37 seconds today Pain is present in low back during sit<>supine Discussed daily activities around her home and what is limited== patient notes feeling greater unsteadiness  today-- this can vary; she also has intermittent L foot pain and low back pain.  Therapeutic Exercise Toe extension alternating big toe and other 4 digits (toe yoga) Used a ball under the ball of her foot to add toe spreading as toe yoga activities-- unable today Neuromuscular re-ed: Tandem=1 second R and L-- difficulty dec'ing UE support today Standing narrow base of support is challenging today with patient reaching for hand held support Rolling R<>L without dizziness    [facility] Adult PT Treatment:                                                DATE: 09/17/24 Therapeutic Exercise: Standing lumbar extension 2 x 10  Reviewed HEP- discussing centralization of pain as it relates to repeated movement  Therapeutic Activity: Hip hinge at wall with foam roller x 10   Self Care: Discussed use of TENS unit for pain modulation with recommendation to bring in her TENS unit at next visit to discuss proper setup and application.  Reviewed bending/lifting mechanics as part of previously provided handout   [facility] Adult PT Treatment:                                                DATE: 09/15/24 Therapeutic Exercise: Standing lumbar extension x 10  Reviewed HEP, frequency of repeated movement   Neuromuscular re-ed: Standing shoulder extension with TA activation 2 x 10; red band  Therapeutic Activity: Seated hip hinge x 10 Standing hip hinge at wall with foam roller x 10     [facility] Adult PT Treatment:                                                DATE: 09/10/24 Therapeutic Exercise: Standing lumbar extension 2 x 10  Prone lying x 3 minutes  Sciatic nerve glides LLE x 5  Updated HEP   Self Care: Educated patient on bending/lifting mechanics, posture, sleep positioning recommendations, avoiding trunk flexion activity with handout provided Modalities for pain control- ice, heat, TENS unit.  Educated on rationale of repeated movement for pain centralization    PATIENT EDUCATION:  Education  details:see treatment  Person educated: Patient Education method: Explanation,  Education comprehension: verbalized understanding  HOME EXERCISE PROGRAM: Access Code: [email or web address]  URL: [email or web address] Date: 09/10/2024 Prepared by: Lucie Meeter  Exercises - Seated Plantar Fascia Mobilization with Small Ball  - 1 x daily - 7 x weekly - 3-5 minute  hold - Seated Great Toe Extension  - 1 x daily - 7 x weekly - 2 sets - 10 reps - Long Sitting Ankle Plantar Flexion with Resistance  - 1 x daily - 7 x weekly - 2 sets - 10 reps - Long Sitting Ankle Inversion with Resistance  - 1 x daily - 7 x weekly - 2 sets - 10 reps - Toe Raises with Counter Support  - 1 x daily - 7 x weekly - 2 sets - 10 reps - Heel Raises with Counter Support  - 1 x daily - 7 x weekly - 2 sets - 10 reps - Towel Scrunches  - 1 x daily - 7 x weekly - 1 sets - 5 reps - Rolling rightleft sides for vestibular habituation  - 1-2 x daily - 7 x weekly - 1 sets - 10 reps - Seated Right Head Turns Vestibular Habituation  - 1-2 x daily - 7 x weekly - 1 sets - 5 reps - Standing Gaze Stabilization with Head Rotation  - 1 x daily - 7 x weekly - 2 sets - 10 reps - Standing Hip Abduction with Counter Support  - 1 x daily - 7 x weekly - 2 sets - 10 reps - Standing Romberg to 3/4 Tandem Stance  - 1 x daily - 7 x weekly - 3 sets - max  hold - Gastroc Stretch on Wall  - 1 x daily - 7 x weekly - 3 sets - 30 sec  hold - Hooklying Clamshell with Resistance  - 1 x daily - 7 x weekly - 2 sets - 10 reps - Supine Hip Adduction Isometric with Ball  - 1 x daily - 7 x weekly - 2 sets - 10 reps - Supine Transversus Abdominis Bracing - Hands on Stomach  - 1 x daily - 7 x weekly - 2 sets - 10 reps - 5 sec  hold - 5 Minute Guided Meditation  - 7 x weekly - 10 Minute Guided Meditation  - 7 x weekly - 15 Minute Guided Meditation  - 7 x weekly - 20 Minute Guided Meditation  - 7 x weekly - Standing Lumbar Extension  - 6 x daily - 7 x weekly -  1 sets - 10 reps - Lying Prone  - 2 x daily - 7 x weekly - 5-10 minutes  hold - Supine 90/90 Sciatic Nerve Glide with Knee Flexion/Extension  - 1 x daily - 7 x weekly - 1 sets - 10 reps  Patient Education - Treating Persistent Pain Without Medications: Mindfulness  ASSESSMENT:  CLINICAL IMPRESSION:  Ms Silverstein presents to skilled therapy with complaints of low back pain and complaints of fatigue.  Session started with light core exercises in seated that caused increased low back pain. Attempted lumbar extension as this has previously provided relief, however today this caused increased pain.Upon further discussion patient reports return of previous urinary symptoms including increased frequency, burning, and color changes. It was recommended that she call urology today and discuss acute return of these symptoms as she already has f/u scheduled on Friday. Finished session educating patient on use of  her TENS unit, however discussed that her worsening pain could have non-musculoskeletal causes which is why she needs to f/u with urology. Patient verbalized understanding and left session in  NAD.     EVAL: Patient is a 78 y.o. female who was seen today for physical therapy evaluation and treatment for s/p Lt ankle arthroscopy Brostrom stabilization, peroneus brevis and longus debridement and tenosynovectomy on 03/12/24. She demonstrates ROM, strength, gait and balance deficits that are consistent with her recent post-operative status. She will benefit from skilled PT to address the above stated deficits in order to return to optimal function.   OBJECTIVE IMPAIRMENTS: Abnormal gait, decreased activity tolerance, decreased balance, decreased endurance, decreased knowledge of condition, decreased mobility, difficulty walking, decreased ROM, decreased strength, increased edema, impaired flexibility, impaired sensation, improper body mechanics, postural dysfunction, and pain.   GOALS: Goals reviewed with  patient? Yes  LONG TERM GOALS: Target date: 09/20/24  Patient will score >/= 60/104 on the Foot and Ankle Disability Index (MDC 7) to signify clinically meaningful improvement in functional abilities.  Baseline: see above Goal status: MET  2.  Patient will maintain tandem stance for at least 10 seconds to improve gait stability.  Baseline: see above 06/26/24: tandem 1 second 08/04/24: tandem 4 second Goal status:NOT MET-- 1 second on 09/23/24  3.  Patient will be modified independent with curb/stair negotiation.  Baseline: uses cane and requires support from spouse  06/26/24: requires cane  Goal status: MET  4.  Patient will self-report ability to tolerate at least 30 minutes of standing/walking activity.  Baseline: 10 minutes  06/26/24: 20 minutes  08/04/24: limited due to back pain and dizziness, 10 minutes  Goal status: NOT MET  5.  Patient will demonstrate at least 4/5 Lt ankle strength to improve stability when navigating on uneven terrain.  Baseline: see above  Goal status: MET  6.  Patient will be independent with advanced home program to progress/maintain current level of function.  Baseline: initial HEP issued  Goal status: MET   7. Patient will tolerate rolling R and L without d/o dizziness.   Baseline: 4/10 dizzines 08/04/24: 4/10 dizziness baseline    Goal status: MET 09/22/24: baseline dizziness today is 1/10  (began again this weekend) Rolling is so minute I don't think it's dizziness   8. Patient will tolerate gaze x 1 viewing in sitting x 20 seconds to work towards improved tolerance to turns in standing.   Baseline: Unable to do 2 reps in sitting   08/04/24: VOR x 1, 20 seconds    Goal status: MET   9. Patient will score </= 40 % disability on the Oswestry Disability Index (MCID is 20%) to signify clinically meaningful improvement in functional abilities.    Baseline: see above   Goal status: NOT MET-- 54%  10. Patient will demonstrate proper lifting mechanics to  improve her tolerance to loading/unloading the dishwasher.    Baseline: increased pain, limits bending activity   Goal status: NOT MET-- this task is still challenging at home  UPDATED GOALS:  SHORT TERM GOALS: Target date: 10/22/24  Patient will demonstrate use of TENS unit for home to manage chronic low back pain. Baseline: to bring next visit Goal status: NEW GOAL  2.  Patient will improve 5 time sit<>stand to < or equal to 18 seconds.  Baseline: 22.53 seconds Goal status: NEW GOAL  LONG TERM GOALS: Target date: 11/22/24  Patient will be indep with HEP progression.  Baseline: has HEP will need progression Goal status: UPDATED GOAL DATE  2.  Patient will maintain tandem stance for at least 10 seconds to improve gait stability.  Baseline: 1 second on 09/23/24 Goal status:  UPDATED GOAL DATE  3.  Patient will score </= 40 % disability on the Oswestry Disability Index (MCID is 20%) to signify clinically meaningful improvement in functional abilities.   Baseline: see above  Goal status: UPDATED GOAL DATE (54% on 09/22/24)  4.  Patient will self-report ability to tolerate at least 30 minutes of standing/walking activity.  Baseline: 10 minutes  06/26/24: 20 minutes  08/04/24: limited due to back pain and dizziness, 10 minutes; also unable on 09/23/24 Goal status: UPDATED GOAL DATE  PLAN:  PT FREQUENCY: 2x/week  PT DURATION: 6 weeks  PLANNED INTERVENTIONS: 97164- PT Re-evaluation, 97750- Physical Performance Testing, 97110-Therapeutic exercises, 97530- Therapeutic activity, 97112- Neuromuscular re-education, 97535- Self Care, [id number]- Manual therapy, [id number]- Gait training, [phone]- Aquatic Therapy, 97016- Vasopneumatic device, Taping, Dry Needling, Cryotherapy, and Moist heat  PLAN FOR NEXT SESSION: review and progress HEP; static balance; core/hip strengthening as tolerated; response to extension; focus on addressing back pain, teach self scar tissue massage for lateral ankle discomfort,  follow up and see if she saw urologist  Lavanda Cleverly, Student-PT 09/24/24 4:22 PM

## 2024-09-25 ENCOUNTER — Other Ambulatory Visit: Payer: Self-pay | Admitting: Family Medicine

## 2024-09-26 ENCOUNTER — Other Ambulatory Visit: Payer: Self-pay | Admitting: Family Medicine

## 2024-09-26 DIAGNOSIS — N3010 Interstitial cystitis (chronic) without hematuria: Secondary | ICD-10-CM | POA: Diagnosis not present

## 2024-09-26 DIAGNOSIS — R399 Unspecified symptoms and signs involving the genitourinary system: Secondary | ICD-10-CM | POA: Diagnosis not present

## 2024-09-26 DIAGNOSIS — R300 Dysuria: Secondary | ICD-10-CM | POA: Diagnosis not present

## 2024-09-26 DIAGNOSIS — R301 Vesical tenesmus: Secondary | ICD-10-CM | POA: Diagnosis not present

## 2024-09-29 DIAGNOSIS — M51361 Other intervertebral disc degeneration, lumbar region with lower extremity pain only: Secondary | ICD-10-CM | POA: Diagnosis not present

## 2024-09-29 DIAGNOSIS — M47896 Other spondylosis, lumbar region: Secondary | ICD-10-CM | POA: Diagnosis not present

## 2024-09-29 DIAGNOSIS — M5416 Radiculopathy, lumbar region: Secondary | ICD-10-CM | POA: Diagnosis not present

## 2024-09-30 ENCOUNTER — Ambulatory Visit: Admitting: Professional

## 2024-10-02 ENCOUNTER — Other Ambulatory Visit: Payer: Self-pay | Admitting: Family Medicine

## 2024-10-02 DIAGNOSIS — I2510 Atherosclerotic heart disease of native coronary artery without angina pectoris: Secondary | ICD-10-CM

## 2024-10-02 DIAGNOSIS — E785 Hyperlipidemia, unspecified: Secondary | ICD-10-CM

## 2024-10-03 ENCOUNTER — Other Ambulatory Visit: Payer: Self-pay | Admitting: Student

## 2024-10-03 DIAGNOSIS — E559 Vitamin D deficiency, unspecified: Secondary | ICD-10-CM

## 2024-10-05 ENCOUNTER — Other Ambulatory Visit: Payer: Self-pay | Admitting: Family Medicine

## 2024-10-05 DIAGNOSIS — M797 Fibromyalgia: Secondary | ICD-10-CM

## 2024-10-07 NOTE — Assessment & Plan Note (Signed)
 Follows with rheumatology, Follows with Dr Keturah of [facility].

## 2024-10-07 NOTE — Assessment & Plan Note (Signed)
 Stable.  Follows with pulmonology.  No recent exacerbations.

## 2024-10-07 NOTE — Assessment & Plan Note (Signed)
 On aspirin  81 mg daily.  Followed by cardiology

## 2024-10-07 NOTE — Assessment & Plan Note (Signed)
 Follows with gastroenterology.  On PPI.

## 2024-10-07 NOTE — Assessment & Plan Note (Signed)
 Stable on current medications.

## 2024-10-07 NOTE — Assessment & Plan Note (Signed)
 Follows with urology-[facility], Dr. Carolynn.  Continue follow-up with urology.

## 2024-10-07 NOTE — Assessment & Plan Note (Signed)
 On Repatha  injections. LDL goal <70.  Continue follow-up with cardiology. Encourage heart healthy diet such as MIND or DASH diet, increase exercise, avoid trans fats, simple carbohydrates and processed foods, consider a krill or fish or flaxseed oil cap daily.   Lab Results  Component Value Date   CHOL 157 07/23/2024   HDL 49.50 07/23/2024   LDLCALC 76 07/23/2024   LDLDIRECT 74.0 06/12/2023   TRIG 157.0 (H) 07/23/2024   CHOLHDL 3 07/23/2024

## 2024-10-07 NOTE — Assessment & Plan Note (Signed)
 Hydrate and monitor, follows with nephrology-[facility].  Continue follow-up with nephrology.

## 2024-10-07 NOTE — Assessment & Plan Note (Signed)
 Encouraged to get adequate exercise, calcium and vitamin d intake

## 2024-10-07 NOTE — Assessment & Plan Note (Signed)
 Supplement monitor.

## 2024-10-07 NOTE — Assessment & Plan Note (Signed)
 Follows with neurology, has been participating in vestibular therapy for BPPV.  Continue follow-up with neurology.

## 2024-10-07 NOTE — Assessment & Plan Note (Signed)
 Follows with gastroenterology, Dr. Leigh.  On Linzess  and MiraLAX .  Continue follow-up with GI.

## 2024-10-07 NOTE — Assessment & Plan Note (Signed)
 Uses Lasix  as needed.  Encourage wearing of compression stockings.

## 2024-10-07 NOTE — Assessment & Plan Note (Signed)
 Supplement and monitor

## 2024-10-07 NOTE — Assessment & Plan Note (Signed)
 Well controlled, no changes to meds. Encouraged heart healthy diet such as the DASH diet and exercise as tolerated.

## 2024-10-07 NOTE — Progress Notes (Unsigned)
 Subjective:     Patient ID: [name], female    DOB: 1946/07/05, 78 y.o.   MRN: [id number]  No chief complaint on file.   HPI  Discussed the use of AI scribe software for clinical note transcription with the patient, who gave verbal consent to proceed.  Follows with-Ortho, PT, [facility], nephrology-[facility], gastroenterology, cardiology, behavioral health, Neurology, ENT, Rheumatology  Neurology-has referred patient to vestibular therapy for BPPV   Behavioral health?**  Past medical history- R breast cancer, estrogen receptor positive; OSA, COPD  COPD and Asthma Albuterol  as needed, fluticasone  inhaler 1-2 puffs BID  HTN-amlodipine  5 mg daily, atenolol  25 mg twice daily  CAD-aspirin  81 mg daily  Anxiety and depression--- who is behavioral health??? Cymbalta  60 mg twice daily, hydroxyzine  10 mg (0.5-2 tab) p.o. 3 times daily as needed for anxiety  Osteoporosis-Repatha  injection q. 14 days  Vitamin D  deficiency-2000 units vitamin D  daily How much taking daily?  Vitamin B12 deficiency-1000 mcg daily vitamin B12  BLE edema-furosemide  20 mg as needed  Constipation-follows with GI-Dr. Elspeth Naval Linzess  145 mcg daily, MiraLAX  17 g twice daily  GERD-Protonix  40 mg twice daily  Chronic pain/neuropathy-gabapentin  TID  Patient denies fever, chills, SOB, CP, palpitations, dyspnea, edema, HA, vision changes, N/V/D, abdominal pain, urinary symptoms, rash, weight changes, and recent illness or hospitalizations.   History of Present Illness              Health Maintenance Due  Topic Date Due   COVID-19 Vaccine (5 - 2025-26 season) 08/25/2024    Past Medical History:  Diagnosis Date   Abdominal pain 10/26/2013   Allergic rhinitis 01/25/2016   ANA positive 07/29/2013   Anemia    Arthralgia 08/18/2013   Arthritis    Asthma, allergic 08/05/2012   Atrial flutter    past history- not current   Atypical chest pain 01/01/2015   Bilateral  carpal tunnel syndrome 03/04/2015   CAD in native artery 08/25/2010   Minor CAD 2009, 2011, low risk Myoview  2013 and 2016        Cataract    bilaterally removed    Cellulitis of breast 02/05/2023   Chronic constipation    Chronic cough 01/09/2017   Chronic interstitial cystitis 02/01/2017   Chronic night sweats 01/08/2015   Common bile duct dilatation 02/13/2023   Concussion    x 3   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   DDD (degenerative disc disease), cervical 09/17/2018   Degenerative scoliosis 04/22/2018   Deviated septum 05/12/2016   Dysphagia    Dyspnea    Dysuria 03/09/2021   Elevated alkaline phosphatase level 01/25/2023   Essential hypertension 08/25/2010   External hemorrhoids 02/05/2015   Facial trauma 01/22/2023   Family history of adverse reaction to anesthesia    Family history of malignant hyperthermia    father had this   Fibrocystic breast disease 10/18/2010   Fibromyalgia    Frequency of urination    Generalized anxiety disorder 08/03/2010   GERD (gastroesophageal reflux disease)    Headache 10/13/2013   High serum vitamin D  05/18/2021   History of chronic bronchitis    History of colonic polyp 02/25/2019   History of hiatal hernia    History of tachycardia    CONTROLLED  WITH ATENOLOL    Hyperglycemia 03/08/2021   Hyperkalemia 09/28/2022   Hyperlipidemia    Intertrigo 12/13/2022   Kidney stone 02/26/2019   Knee pain, right 04/30/2015   Left hip pain 04/30/2015   Leg pain 02/24/2011  Qualifier: Diagnosis of   By: Antonio ROSALEA Rockers         Leukocytosis 07/05/2022   Local reaction to hymenoptera sting 05/09/2023   Low back pain 09/08/2020   Major depressive disorder 02/05/2013   Malignant neoplasm of lower-outer quadrant of right breast of female, estrogen receptor positive 01/03/2013   Nasal turbinate hypertrophy 05/12/2016   Oral herpes 02/05/2023   OSA and COPD overlap syndrome 06/10/2020   Osteoporosis 01/2019   T score -2.2 stable/improved  from prior study   Pelvic pain    Peripheral neuropathy 05/22/2014   Personal history of radiation therapy    Pneumonia    Primary insomnia 06/25/2017   Pulmonary nodules 02/12/2015   Rash and other nonspecific skin eruption 05/09/2023   Recurrent falls 05/02/2020   Recurrent sinusitis 02/25/2019   Right arm pain 07/20/2016   RLS (restless legs syndrome) 11/09/2019   RUQ pain 07/05/2022   S/P radiation therapy 11/12/12 - 12/05/12   right Breast   Sciatica 04/30/2015   Sepsis 2014   from UTI    Sjogren's disease 12/12/2021   Splenic lesion 09/02/2012   Sprain of lateral ligament of ankle joint 07/31/2023   Stage 3 chronic kidney disease    Statin intolerance 02/29/2020   Stricture and stenosis of esophagus 10/19/2011   Tongue lesion 02/05/2023   Tremor 07/05/2022   Urgency of urination    Vertigo 01/22/2023   Vitamin B12 deficiency 03/08/2021   Vocal cord cyst 05/18/2021    Past Surgical History:  Procedure Laterality Date   24 HOUR PH STUDY N/A 04/03/2016   Procedure: 24 HOUR PH STUDY;  Surgeon: Elspeth Deward Naval, MD;  Location: [facility];  Service: Gastroenterology;  Laterality: N/A;   ANKLE ARTHROSCOPY WITH RECONSTRUCTION Left 03/12/2024   Procedure: ANKLE ARTHROSCOPY WITH OPEN LATERAL ANKLE LIGAMENT STABILIZATION;  Surgeon: Barton Drape, MD;  Location: [facility];  Service: Orthopedics;  Laterality: Left;   BREAST BIOPSY Right 08/23/2012   ADH   BREAST BIOPSY Right 10/11/2012   Ductal Carcinoma   BREAST EXCISIONAL BIOPSY Right 09/04/2013   benign   BREAST LUMPECTOMY Right 10/11/2012   W/ SLN BX   CARDIAC CATHETERIZATION  09-13-2007  DR MORRIS   WELL-PRESERVED LVF/  DIFFUSE SCATTERED CORONARY CALCIFACATION AND ATHEROSCLEROSIS WITHOUT OBSTRUCTION   CARDIAC CATHETERIZATION  08-04-2010  DR MCALHANY   NON-OBSTRUCTIVE CAD/  pLAD 40%/  oLAD 30%/  mLAD 30%/  pRCA 30%/  EF 60%   CARDIOVASCULAR STRESS TEST  06-18-2012  DR McALHANY   LOW RISK  NUCLEAR STUDY/  SMALL FIXED AREA OF MODERATELY DECREASED UPTAKE IN ANTEROSEPTAL WALL WHICH MAY BE ARTIFACTUAL/  NO ISCHEMIA/  EF 68%   COLONOSCOPY  09/29/2010   CYSTOSCOPY     CYSTOSCOPY WITH HYDRODISTENSION AND BIOPSY N/A 03/06/2014   Procedure: CYSTOSCOPY/HYDRODISTENSION/ INSTILATION OF MARCAINE  AND PYRIDIUM ;  Surgeon: Arlena LILLETTE Gal, MD;  Location: [facility];  Service: Urology;  Laterality: N/A;   CYSTOSCOPY/URETEROSCOPY/HOLMIUM LASER/STENT PLACEMENT Left 03/21/2023   Procedure: CYSTOSCOPY LEFT RETROGRADE PYELOGRAM, LEFT URETEROSCOPY, HOLMIUM LASER LITHOTRIPSYM, AND LEFT URETERAL STENT PLACEMENT;  Surgeon: Devere Lonni Righter, MD;  Location: [facility];  Service: Urology;  Laterality: Left;  60 MINUTES   ESOPHAGEAL MANOMETRY N/A 04/03/2016   Procedure: ESOPHAGEAL MANOMETRY (EM);  Surgeon: Elspeth Deward Naval, MD;  Location: [facility];  Service: Gastroenterology;  Laterality: N/A;   EXTRACORPOREAL SHOCK WAVE LITHOTRIPSY Left 02/06/2019   Procedure: EXTRACORPOREAL SHOCK WAVE LITHOTRIPSY (ESWL);  Surgeon: Ottelin, Mark, MD;  Location: [facility];  Service: Urology;  Laterality: Left;   NASAL SINUS SURGERY  1985   ORIF RIGHT ANKLE  FX  2006   POLYPECTOMY     REMOVAL VOCAL CORD CYST  01/2013   REPAIR OF PERONEUS BREVIS TENDON Left 03/12/2024   Procedure: REPAIR OF PERONEUS TENDON;  Surgeon: Barton Drape, MD;  Location: [facility];  Service: Orthopedics;  Laterality: Left;   RIGHT BREAST BX  08/23/2012   RIGHT HAND SURGERY  X3  LAST ONE 2009   INCLUDES  ORIF RIGHT 5TH FINGER AND REVISION TWICE   SKIN CANCER EXCISION     face,arms   TONSILLECTOMY AND ADENOIDECTOMY  AGE 72   TOTAL ABDOMINAL HYSTERECTOMY W/ BILATERAL SALPINGOOPHORECTOMY  1982   W/  APPENDECTOMY   TRANSTHORACIC ECHOCARDIOGRAM  06/24/2012   GRADE I DIASTOLIC DYSFUNCTION/  EF 55-60%/  MILD MR   UPPER GASTROINTESTINAL ENDOSCOPY      Family History  Problem Relation Age of Onset    Rectal cancer Mother    Colon cancer Mother    Pancreatic cancer Mother    Diabetes Mother    Breast cancer Mother 41   Breast cancer Maternal Aunt        breast   Birth defects Maternal Aunt    Irritable bowel syndrome Son    Heart disease Son        CAD, MV replacement   Allergic Disorder Daughter    Diabetes Daughter    Colon cancer Father    Colon polyps Father    Diabetes Father    Stroke Father    Heart disease Father        CHF   Hyperlipidemia Father    Hypertension Father    Arthritis Father    Breast cancer Paternal Aunt    Breast cancer Paternal Aunt    Arthritis Paternal Uncle    Allergic Disorder Daughter    Heart disease Cousin        CAD, had a blood clot after stenting   Esophageal cancer Neg Hx    Stomach cancer Neg Hx     Social History   Socioeconomic History   Marital status: Married    Spouse name: Tanda    Number of children: 3   Years of education: 16   Highest education level: Bachelor's degree (e.g., BA, AB, BS)  Occupational History   Occupation: Retired    Comment: Charity fundraiser  Tobacco Use   Smoking status: Former    Current packs/day: 0.00    Average packs/day: 1 pack/day for 15.0 years (15.0 ttl pk-yrs)    Types: Cigarettes    Start date: 05/27/1990    Quit date: 05/27/2005    Years since quitting: 19.3    Passive exposure: Never   Smokeless tobacco: Never  Vaping Use   Vaping status: Never Used  Substance and Sexual Activity   Alcohol use: No    Alcohol/week: 0.0 standard drinks of alcohol   Drug use: No   Sexual activity: Not Currently    Partners: Male    Birth control/protection: Surgical    Comment: 1st intercourse 78 yo-Fewer than 5 partners, hysterectomy  Other Topics Concern   Not on file  Social History Narrative   Lives with husband.   Caffeine use: 1/2 cup per day   Exercise-- 2days a week [employer],  Water  aerobics, walking       Retired Engineer, civil (consulting)   No dietary restrictions, tries to maintain a heart healthy diet   Very  pleasant lady   Social Drivers of Corporate investment banker Strain: Low Risk  (06/21/2024)   Overall Financial Resource Strain (CARDIA)    Difficulty of Paying Living Expenses: Not very hard  Food Insecurity: No Food Insecurity (06/21/2024)   Hunger Vital Sign    Worried About Running Out of Food in the Last Year: Never true    Ran Out of Food in the Last Year: Never true  Transportation Needs: No Transportation Needs (06/21/2024)   PRAPARE - Administrator, Civil Service (Medical): No    Lack of Transportation (Non-Medical): No  Physical Activity: Insufficiently Active (06/21/2024)   Exercise Vital Sign    Days of Exercise per Week: 2 days    Minutes of Exercise per Session: 20 min  Stress: No Stress Concern Present (06/21/2024)   [employer] of Occupational Health - Occupational Stress Questionnaire    Feeling of Stress: Only a little  Social Connections: Socially Integrated (06/21/2024)   Social Connection and Isolation Panel    Frequency of Communication with Friends and Family: More than three times a week    Frequency of Social Gatherings with Friends and Family: Twice a week    Attends Religious Services: More than 4 times per year    Active Member of [employer] or Organizations: Yes    Attends Engineer, structural: More than 4 times per year    Marital Status: Married  Catering manager Violence: Not At Risk (03/04/2024)   Humiliation, Afraid, Rape, and Kick questionnaire    Fear of Current or Ex-Partner: No    Emotionally Abused: No    Physically Abused: No    Sexually Abused: No    Outpatient Medications Prior to Visit  Medication Sig Dispense Refill   acetaminophen  (TYLENOL ) 500 MG tablet Take 1,000 mg by mouth every 6 (six) hours as needed for mild pain or headache.      albuterol  (VENTOLIN  HFA) 108 (90 Base) MCG/ACT inhaler Inhale 2 puffs into the lungs every 4 (four) hours as needed for wheezing or shortness of breath (coughing fits). 18 g 1    amLODipine  (NORVASC ) 5 MG tablet Take 1 tablet by mouth once daily 90 tablet 3   aspirin  EC 81 MG tablet Take 1 tablet (81 mg total) by mouth daily. 90 tablet 3   atenolol  (TENORMIN ) 25 MG tablet Take 1 tablet (25 mg total) by mouth 2 (two) times daily. 180 tablet 1   carboxymethylcellul-glycerin (OPTIVE) 0.5-0.9 % ophthalmic solution Place 1 drop into both eyes daily as needed for dry eyes.     Cholecalciferol  (VITAMIN D3) 50 MCG (2000 UT) CAPS Take 2,000 Units by mouth daily.     cyanocobalamin  (VITAMIN B12) 1000 MCG tablet Take 1,000 mg by mouth daily with breakfast.     diclofenac Sodium (VOLTAREN) 1 % GEL Apply 2 g topically daily as needed (Back pain).     DULoxetine  (CYMBALTA ) 60 MG capsule Take 1 capsule by mouth twice daily 180 capsule 0   EPINEPHrine  0.3 mg/0.3 mL IJ SOAJ injection Inject 0.3 mg into the muscle as needed for anaphylaxis. 2 each 1   fluticasone  (FLONASE ) 50 MCG/ACT nasal spray Place 2 sprays into both nostrils daily. 16 g 5   fluticasone  (FLOVENT  HFA) 220 MCG/ACT inhaler Inhale 1-2 puffs into the lungs in the morning and at bedtime. 1 each 12   folic acid  (FOLVITE ) 1 MG tablet Take 1 tablet (1 mg total) by mouth daily.     furosemide  (LASIX )  20 MG tablet TAKE 1 TABLET BY MOUTH AS NEEDED FOR EDEMA (Patient taking differently: Take 20 mg by mouth. Once every 3 days) 90 tablet 3   gabapentin  (NEURONTIN ) 300 MG capsule TAKE 2 CAPSULES BY MOUTH IN THE MORNING AND 1 IN THE AFTERNOON AND 3 AT BEDTIME 180 capsule 1   hydrOXYzine  (ATARAX ) 10 MG tablet Take 0.5-2 tablets (5-20 mg total) by mouth 3 (three) times daily as needed for anxiety (not with Loratadine ). 90 tablet 2   linaclotide  (LINZESS ) 145 MCG CAPS capsule Take 1 capsule (145 mcg total) by mouth daily before breakfast. 30 capsule 5   loratadine  (CLARITIN ) 10 MG tablet Take 10 mg by mouth daily as needed for rhinitis.     nitroGLYCERIN  (NITROSTAT ) 0.4 MG SL tablet Place 1 tablet (0.4 mg total) under the tongue every 5  (five) minutes x 3 doses as needed for chest pain. 25 tablet 2   nystatin  cream (MYCOSTATIN ) Apply 1 application topically 2 (two) times daily. 30 g 2   pantoprazole  (PROTONIX ) 40 MG tablet Take 1 tablet by mouth 2 (two) times daily.     polyethylene glycol (MIRALAX ) 17 g packet Take 17 g by mouth 2 (two) times daily. 14 each 0   REPATHA  SURECLICK 140 MG/ML SOAJ INJECT CONTENTS OF 1 PEN SUBCUTANEOUSLY EVERY 14 DAYS 6 mL 0   sucralfate  (CARAFATE ) 1 GM/10ML suspension Take 10 mLs (1 g total) by mouth 4 (four) times daily -  with meals and at bedtime. 420 mL 1   traMADol  (ULTRAM ) 50 MG tablet Take 1 tablet by mouth 3 (three) times daily as needed.     traZODone  (DESYREL ) 100 MG tablet Take 1 tablet (100 mg total) by mouth at bedtime. 90 tablet 1   Vitamin D , Ergocalciferol , (DRISDOL ) 1.25 MG (50000 UNIT) CAPS capsule Take 1 capsule by mouth once a week 4 capsule 0   Vonoprazan Fumarate  (VOQUEZNA ) 10 MG TABS Take 1 capsule by mouth daily. 30 tablet 1   No facility-administered medications prior to visit.    Allergies  Allergen Reactions   Clindamycin Hcl Shortness Of Breath and Rash   Levofloxacin  Rash    Other reaction(s): sick, rash hives severe itching   Other Other (See Comments)    Father had malignant hyperthermia   Penicillins Anaphylaxis   Rosuvastatin  Anaphylaxis   Baclofen  Rash   Codeine Hives and Nausea Only   Erythromycin Hives   Haemophilus Influenzae Vaccines Other (See Comments)    Local reaction at site   Latex Hives   Lincomycin Other (See Comments)   Pentazocine Lactate Other (See Comments)    HALLUCINATION   Pneumococcal Vaccine Polyvalent Hives, Swelling and Other (See Comments)    REACTION: swelling at injection site   Prednisone  Rash    Other reaction(s): Rash, Hives - can tolerate if she takes with benadryl     Tamoxifen  Nausea And Vomiting   Doxycycline  Rash   Fluzone [Influenza Virus Vaccine] Hives    Local reaction at the site   Haemophilus Influenzae  Other (See Comments)    Local reaction at the site     ROS See HPI    Objective:    Physical Exam  General: No acute distress. Awake and conversant.  Eyes: Normal conjunctiva, anicteric. Round symmetric pupils.  ENT: Hearing grossly intact. No nasal discharge.  Neck: Neck is supple. No masses or thyromegaly.  Respiratory: CTAB. Respirations are non-labored. No wheezing.  Skin: Warm. No rashes or ulcers.  Psych: Alert and oriented. Cooperative, Appropriate  mood and affect, Normal judgment.  CV: RRR. No murmur. No lower extremity edema.  MSK: Normal ambulation. No clubbing or cyanosis.  Neuro:  CN II-XII grossly normal.    There were no vitals taken for this visit. Wt Readings from Last 3 Encounters:  08/27/24 128 lb 12.8 oz (58.4 kg)  08/14/24 127 lb 12.8 oz (58 kg)  07/23/24 126 lb (57.2 kg)       Assessment & Plan:   Problem List Items Addressed This Visit     CAD in native artery   On aspirin  81 mg daily.  Followed by cardiology      Chronic interstitial cystitis   Follows [facility], Dr. Carolynn.  Continue follow-up with urology.      Constipation   Follows with gastroenterology, Dr. Leigh.  On Linzess  and MiraLAX .  Continue follow-up with GI.      COPD with asthma (HCC) - Primary   Stable.  Follows with pulmonology.  No recent exacerbations.      Depression with anxiety   Stable on current medications. Follows with Behavioral Health.        Essential hypertension   Well controlled, no changes to meds. Encouraged heart healthy diet such as the DASH diet and exercise as tolerated.        GERD (gastroesophageal reflux disease) (Chronic)   Follows with gastroenterology.  On PPI.      Hyperlipidemia   On Repatha  injections. LDL goal <70.  Continue follow-up with cardiology. Encourage heart healthy diet such as MIND or DASH diet, increase exercise, avoid trans fats, simple carbohydrates and processed foods, consider a krill or  fish or flaxseed oil cap daily.   Lab Results  Component Value Date   CHOL 157 07/23/2024   HDL 49.50 07/23/2024   LDLCALC 76 07/23/2024   LDLDIRECT 74.0 06/12/2023   TRIG 157.0 (H) 07/23/2024   CHOLHDL 3 07/23/2024         Osteoporosis   Encouraged to get adequate exercise, calcium  and vitamin d  intake.      Pedal edema   Uses Lasix  as needed.  Encourage wearing of compression stockings.      Sjogren's disease   Follows with rheumatology, Follows with Dr Keturah of [facility].       Stage 3 chronic kidney disease   Hydrate and monitor, follows with nephrology-[facility].  Continue follow-up with nephrology.      Vertigo   Follows with neurology, has been participating in vestibular therapy for BPPV.  Continue follow-up with neurology.      Vitamin B12 deficiency   Supplement monitor.      Vitamin D  deficiency   Supplement and monitor       I am having [name] maintain her acetaminophen , aspirin  EC, cyanocobalamin , nystatin  cream, sucralfate , diclofenac Sodium, loratadine , Vitamin D3, OPTIVE, folic acid , EPINEPHrine , polyethylene glycol, furosemide , nitroGLYCERIN , traMADol , hydrOXYzine , fluticasone , amLODipine , fluticasone , albuterol , atenolol , linaclotide , Voquezna , pantoprazole , gabapentin , traZODone , Repatha  SureClick, Vitamin D  (Ergocalciferol ), and DULoxetine .  No orders of the defined types were placed in this encounter.

## 2024-10-08 ENCOUNTER — Ambulatory Visit: Admitting: Rehabilitative and Restorative Service Providers"

## 2024-10-08 ENCOUNTER — Encounter: Payer: Self-pay | Admitting: Rehabilitative and Restorative Service Providers"

## 2024-10-08 ENCOUNTER — Ambulatory Visit: Payer: Self-pay | Admitting: Student

## 2024-10-08 ENCOUNTER — Encounter: Payer: Self-pay | Admitting: Student

## 2024-10-08 ENCOUNTER — Ambulatory Visit (INDEPENDENT_AMBULATORY_CARE_PROVIDER_SITE_OTHER): Admitting: Student

## 2024-10-08 VITALS — BP 113/71 | HR 75 | Ht 65.5 in | Wt 128.4 lb

## 2024-10-08 DIAGNOSIS — M6281 Muscle weakness (generalized): Secondary | ICD-10-CM

## 2024-10-08 DIAGNOSIS — I10 Essential (primary) hypertension: Secondary | ICD-10-CM

## 2024-10-08 DIAGNOSIS — R5383 Other fatigue: Secondary | ICD-10-CM | POA: Diagnosis not present

## 2024-10-08 DIAGNOSIS — N183 Chronic kidney disease, stage 3 unspecified: Secondary | ICD-10-CM

## 2024-10-08 DIAGNOSIS — M3500 Sicca syndrome, unspecified: Secondary | ICD-10-CM

## 2024-10-08 DIAGNOSIS — R2681 Unsteadiness on feet: Secondary | ICD-10-CM

## 2024-10-08 DIAGNOSIS — R42 Dizziness and giddiness: Secondary | ICD-10-CM

## 2024-10-08 DIAGNOSIS — F418 Other specified anxiety disorders: Secondary | ICD-10-CM | POA: Diagnosis not present

## 2024-10-08 DIAGNOSIS — N3010 Interstitial cystitis (chronic) without hematuria: Secondary | ICD-10-CM

## 2024-10-08 DIAGNOSIS — E538 Deficiency of other specified B group vitamins: Secondary | ICD-10-CM

## 2024-10-08 DIAGNOSIS — K5900 Constipation, unspecified: Secondary | ICD-10-CM

## 2024-10-08 DIAGNOSIS — E785 Hyperlipidemia, unspecified: Secondary | ICD-10-CM | POA: Diagnosis not present

## 2024-10-08 DIAGNOSIS — J4489 Other specified chronic obstructive pulmonary disease: Secondary | ICD-10-CM

## 2024-10-08 DIAGNOSIS — D509 Iron deficiency anemia, unspecified: Secondary | ICD-10-CM | POA: Diagnosis not present

## 2024-10-08 DIAGNOSIS — F5101 Primary insomnia: Secondary | ICD-10-CM

## 2024-10-08 DIAGNOSIS — M810 Age-related osteoporosis without current pathological fracture: Secondary | ICD-10-CM

## 2024-10-08 DIAGNOSIS — R2689 Other abnormalities of gait and mobility: Secondary | ICD-10-CM

## 2024-10-08 DIAGNOSIS — K219 Gastro-esophageal reflux disease without esophagitis: Secondary | ICD-10-CM

## 2024-10-08 DIAGNOSIS — I2510 Atherosclerotic heart disease of native coronary artery without angina pectoris: Secondary | ICD-10-CM

## 2024-10-08 DIAGNOSIS — E559 Vitamin D deficiency, unspecified: Secondary | ICD-10-CM

## 2024-10-08 DIAGNOSIS — R600 Localized edema: Secondary | ICD-10-CM

## 2024-10-08 LAB — C-REACTIVE PROTEIN: CRP: 1.2 mg/dL (ref 0.5–20.0)

## 2024-10-08 LAB — COMPREHENSIVE METABOLIC PANEL WITH GFR
ALT: 10 U/L (ref 0–35)
AST: 13 U/L (ref 0–37)
Albumin: 4.3 g/dL (ref 3.5–5.2)
Alkaline Phosphatase: 96 U/L (ref 39–117)
BUN: 21 mg/dL (ref 6–23)
CO2: 31 meq/L (ref 19–32)
Calcium: 9.4 mg/dL (ref 8.4–10.5)
Chloride: 97 meq/L (ref 96–112)
Creatinine, Ser: 1.36 mg/dL — ABNORMAL HIGH (ref 0.40–1.20)
GFR: 37.11 mL/min — ABNORMAL LOW (ref >60.00)
Glucose, Bld: 107 mg/dL — ABNORMAL HIGH (ref 70–99)
Potassium: 3.7 meq/L (ref 3.5–5.1)
Sodium: 139 meq/L (ref 135–145)
Total Bilirubin: 0.7 mg/dL (ref 0.2–1.2)
Total Protein: 6.6 g/dL (ref 6.0–8.3)

## 2024-10-08 LAB — CBC WITH DIFFERENTIAL/PLATELET
Basophils Absolute: 0.1 K/uL (ref 0.0–0.1)
Basophils Relative: 0.7 % (ref 0.0–3.0)
Eosinophils Absolute: 0.2 K/uL (ref 0.0–0.7)
Eosinophils Relative: 1.7 % (ref 0.0–5.0)
HCT: 44 % (ref 36.0–46.0)
Hemoglobin: 14.2 g/dL (ref 12.0–15.0)
Lymphocytes Relative: 18.4 % (ref 12.0–46.0)
Lymphs Abs: 2.2 K/uL (ref 0.7–4.0)
MCHC: 32.2 g/dL (ref 30.0–36.0)
MCV: 91.6 fl (ref 78.0–100.0)
Monocytes Absolute: 1.1 K/uL — ABNORMAL HIGH (ref 0.1–1.0)
Monocytes Relative: 9.2 % (ref 3.0–12.0)
Neutro Abs: 8.5 K/uL — ABNORMAL HIGH (ref 1.4–7.7)
Neutrophils Relative %: 70 % (ref 43.0–77.0)
Platelets: 375 K/uL (ref 150.0–400.0)
RBC: 4.8 Mil/uL (ref 3.87–5.11)
RDW: 15.8 % — ABNORMAL HIGH (ref 11.5–15.5)
WBC: 12.1 K/uL — ABNORMAL HIGH (ref 4.0–10.5)

## 2024-10-08 LAB — VITAMIN B12: Vitamin B-12: 273 pg/mL (ref 211–911)

## 2024-10-08 LAB — VITAMIN D 25 HYDROXY (VIT D DEFICIENCY, FRACTURES): VITD: 31.67 ng/mL (ref 30.00–100.00)

## 2024-10-08 LAB — SEDIMENTATION RATE: Sed Rate: 2 mm/h (ref 0–30)

## 2024-10-08 LAB — TSH: TSH: 5.49 u[IU]/mL (ref 0.35–5.50)

## 2024-10-08 MED ORDER — QUETIAPINE FUMARATE 25 MG PO TABS: 25.0000 mg | ORAL_TABLET | Freq: Every day | ORAL | 1 refills | Status: DC

## 2024-10-08 NOTE — Assessment & Plan Note (Signed)
 Chronic insomnia with inadequate sleep duration. Likely contributing to fatigue. - Discontinue trazodone . - Initiate quetiapine 25 mg, starting with 12.5 mg at bedtime. - Follow up in four weeks to assess effectiveness and adjust dosage if necessary.

## 2024-10-08 NOTE — Therapy (Signed)
 OUTPATIENT PHYSICAL THERAPY TREATMENT    Patient Name: [name] MRN: [id number] DOB:03/28/1946, 78 y.o., female Today's Date: 10/08/2024  END OF SESSION:  PT End of Session - 10/08/24 1032     Visit Number 28    Number of Visits 38    Date for Recertification  11/21/24    Authorization Type [employer]    Authorization - Visit Number 28    Progress Note Due on Visit 30    PT Start Time 1030    PT Stop Time 1100    PT Time Calculation (min) 30 min    Activity Tolerance Patient limited by pain;Patient limited by lethargy    Behavior During Therapy [facility] for tasks assessed/performed           Past Medical History:  Diagnosis Date   Abdominal pain 10/26/2013   Allergic rhinitis 01/25/2016   ANA positive 07/29/2013   Anemia    Arthralgia 08/18/2013   Arthritis    Asthma, allergic 08/05/2012   Atrial flutter    past history- not current   Atypical chest pain 01/01/2015   Bilateral carpal tunnel syndrome 03/04/2015   CAD in native artery 08/25/2010   Minor CAD 2009, 2011, low risk Myoview  2013 and 2016        Cataract    bilaterally removed    Cellulitis of breast 02/05/2023   Chronic constipation    Chronic cough 01/09/2017   Chronic interstitial cystitis 02/01/2017   Chronic night sweats 01/08/2015   Common bile duct dilatation 02/13/2023   Concussion    x 3   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   DDD (degenerative disc disease), cervical 09/17/2018   Degenerative scoliosis 04/22/2018   Deviated septum 05/12/2016   Dysphagia    Dyspnea    Dysuria 03/09/2021   Elevated alkaline phosphatase level 01/25/2023   Essential hypertension 08/25/2010   External hemorrhoids 02/05/2015   Facial trauma 01/22/2023   Family history of adverse reaction to anesthesia    Family history of malignant hyperthermia    father had this   Fibrocystic breast disease 10/18/2010   Fibromyalgia    Frequency of urination    Generalized anxiety disorder 08/03/2010   GERD  (gastroesophageal reflux disease)    Headache 10/13/2013   High serum vitamin D  05/18/2021   History of chronic bronchitis    History of colonic polyp 02/25/2019   History of hiatal hernia    History of tachycardia    CONTROLLED  WITH ATENOLOL    Hyperglycemia 03/08/2021   Hyperkalemia 09/28/2022   Hyperlipidemia    Intertrigo 12/13/2022   Kidney stone 02/26/2019   Knee pain, right 04/30/2015   Left hip pain 04/30/2015   Leg pain 02/24/2011   Qualifier: Diagnosis of   By: Antonio DOJamee         Leukocytosis 07/05/2022   Local reaction to hymenoptera sting 05/09/2023   Low back pain 09/08/2020   Major depressive disorder 02/05/2013   Malignant neoplasm of lower-outer quadrant of right breast of female, estrogen receptor positive 01/03/2013   Nasal turbinate hypertrophy 05/12/2016   Oral herpes 02/05/2023   OSA and COPD overlap syndrome 06/10/2020   Osteoporosis 01/2019   T score -2.2 stable/improved from prior study   Pelvic pain    Peripheral neuropathy 05/22/2014   Personal history of radiation therapy    Pneumonia    Primary insomnia 06/25/2017   Pulmonary nodules 02/12/2015   Rash and other nonspecific skin eruption 05/09/2023   Recurrent falls  05/02/2020   Recurrent sinusitis 02/25/2019   Right arm pain 07/20/2016   RLS (restless legs syndrome) 11/09/2019   RUQ pain 07/05/2022   S/P radiation therapy 11/12/12 - 12/05/12   right Breast   Sciatica 04/30/2015   Sepsis 2014   from UTI    Sjogren's disease 12/12/2021   Splenic lesion 09/02/2012   Sprain of lateral ligament of ankle joint 07/31/2023   Stage 3 chronic kidney disease    Statin intolerance 02/29/2020   Stricture and stenosis of esophagus 10/19/2011   Tongue lesion 02/05/2023   Tremor 07/05/2022   Urgency of urination    Vertigo 01/22/2023   Vitamin B12 deficiency 03/08/2021   Vocal cord cyst 05/18/2021   Past Surgical History:  Procedure Laterality Date   24 HOUR PH STUDY N/A 04/03/2016    Procedure: 24 HOUR PH STUDY;  Surgeon: Elspeth Deward Naval, MD;  Location: [facility];  Service: Gastroenterology;  Laterality: N/A;   ANKLE ARTHROSCOPY WITH RECONSTRUCTION Left 03/12/2024   Procedure: ANKLE ARTHROSCOPY WITH OPEN LATERAL ANKLE LIGAMENT STABILIZATION;  Surgeon: Barton Drape, MD;  Location: [facility];  Service: Orthopedics;  Laterality: Left;   BREAST BIOPSY Right 08/23/2012   ADH   BREAST BIOPSY Right 10/11/2012   Ductal Carcinoma   BREAST EXCISIONAL BIOPSY Right 09/04/2013   benign   BREAST LUMPECTOMY Right 10/11/2012   W/ [facility] BX   CARDIAC CATHETERIZATION  09-13-2007  DR MORRIS   WELL-PRESERVED LVF/  DIFFUSE SCATTERED CORONARY CALCIFACATION AND ATHEROSCLEROSIS WITHOUT OBSTRUCTION   CARDIAC CATHETERIZATION  08-04-2010  DR MCALHANY   NON-OBSTRUCTIVE CAD/  pLAD 40%/  oLAD 30%/  mLAD 30%/  pRCA 30%/  EF 60%   CARDIOVASCULAR STRESS TEST  06-18-2012  DR McALHANY   LOW RISK NUCLEAR STUDY/  SMALL FIXED AREA OF MODERATELY DECREASED UPTAKE IN ANTEROSEPTAL WALL WHICH MAY BE ARTIFACTUAL/  NO ISCHEMIA/  EF 68%   COLONOSCOPY  09/29/2010   CYSTOSCOPY     CYSTOSCOPY WITH HYDRODISTENSION AND BIOPSY N/A 03/06/2014   Procedure: CYSTOSCOPY/HYDRODISTENSION/ INSTILATION OF MARCAINE  AND PYRIDIUM ;  Surgeon: Arlena LILLETTE Gal, MD;  Location: [facility];  Service: Urology;  Laterality: N/A;   CYSTOSCOPY/URETEROSCOPY/HOLMIUM LASER/STENT PLACEMENT Left 03/21/2023   Procedure: CYSTOSCOPY LEFT RETROGRADE PYELOGRAM, LEFT URETEROSCOPY, HOLMIUM LASER LITHOTRIPSYM, AND LEFT URETERAL STENT PLACEMENT;  Surgeon: Devere Lonni Righter, MD;  Location: [facility];  Service: Urology;  Laterality: Left;  60 MINUTES   ESOPHAGEAL MANOMETRY N/A 04/03/2016   Procedure: ESOPHAGEAL MANOMETRY (EM);  Surgeon: Elspeth Deward Naval, MD;  Location: [facility];  Service: Gastroenterology;  Laterality: N/A;   EXTRACORPOREAL SHOCK WAVE LITHOTRIPSY Left 02/06/2019   Procedure:  EXTRACORPOREAL SHOCK WAVE LITHOTRIPSY (ESWL);  Surgeon: Ottelin, Mark, MD;  Location: [facility];  Service: Urology;  Laterality: Left;   NASAL SINUS SURGERY  1985   ORIF RIGHT ANKLE  FX  2006   POLYPECTOMY     REMOVAL VOCAL CORD CYST  01/2013   REPAIR OF PERONEUS BREVIS TENDON Left 03/12/2024   Procedure: REPAIR OF PERONEUS TENDON;  Surgeon: Barton Drape, MD;  Location: [facility];  Service: Orthopedics;  Laterality: Left;   RIGHT BREAST BX  08/23/2012   RIGHT HAND SURGERY  X3  LAST ONE 2009   INCLUDES  ORIF RIGHT 5TH FINGER AND REVISION TWICE   SKIN CANCER EXCISION     face,arms   TONSILLECTOMY AND ADENOIDECTOMY  AGE 25   TOTAL ABDOMINAL HYSTERECTOMY W/ BILATERAL SALPINGOOPHORECTOMY  1982   W/  APPENDECTOMY  TRANSTHORACIC ECHOCARDIOGRAM  06/24/2012   GRADE I DIASTOLIC DYSFUNCTION/  EF 55-60%/  MILD MR   UPPER GASTROINTESTINAL ENDOSCOPY     Patient Active Problem List   Diagnosis Date Noted   Breast discharge 08/27/2024   Acute left ankle pain 08/14/2024   Pedal edema 05/20/2024   Otalgia of both ears 04/23/2024   Tinnitus of both ears 04/23/2024   Hypokalemia 12/10/2023   IDA (iron deficiency anemia) 09/12/2023   Stage 3 chronic kidney disease    Right ankle pain 07/31/2023   Common bile duct dilatation 02/13/2023   Oral herpes 02/05/2023   Elevated alkaline phosphatase level 01/25/2023   Vertigo 01/22/2023   Intertrigo 12/13/2022   Hyperkalemia 09/28/2022   Anemia 09/28/2022   Tremor 07/05/2022   RUQ pain 07/05/2022   Leukocytosis 07/05/2022   Sjogren's disease 12/12/2021   Vocal cord cyst 05/18/2021   Vitamin D  deficiency 05/18/2021   Dysuria 03/09/2021   Vitamin B12 deficiency 03/08/2021   Hyperglycemia 03/08/2021   OSA and COPD overlap syndrome 06/10/2020   Recurrent falls 05/02/2020   Statin intolerance 02/29/2020   RLS (restless legs syndrome) 11/09/2019   Recurrent sinusitis 02/25/2019   History of colonic polyp 02/25/2019   DDD  (degenerative disc disease), cervical 09/17/2018   Degenerative scoliosis 04/22/2018   Primary insomnia 06/25/2017   Chronic interstitial cystitis 02/01/2017   Deviated nasal septum 05/12/2016   Hypertrophy of nasal turbinates 05/12/2016   Dysphagia    Allergic rhinitis 01/25/2016   Dyspnea    Sciatica 04/30/2015   Bilateral carpal tunnel syndrome 03/04/2015   Hyperlipidemia 03/01/2015   Pulmonary nodules 02/12/2015   External hemorrhoids 02/05/2015   Chronic night sweats 01/08/2015   Peripheral neuropathy 05/22/2014   Headache 10/13/2013   Arthralgia 08/18/2013   ANA positive 07/29/2013   Depression with anxiety 02/05/2013   Malignant neoplasm of lower-outer quadrant of right breast of female, estrogen receptor positive 01/03/2013   Asthma, allergic 08/05/2012   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   GERD (gastroesophageal reflux disease) 06/03/2012   Stricture and stenosis of esophagus 10/19/2011   Constipation 07/21/2011   Osteoporosis 04/17/2011   Fibrocystic breast disease 10/18/2010   Essential hypertension 08/25/2010   CAD in native artery 08/25/2010   Generalized anxiety disorder 08/03/2010   Bronchitis 03/10/2010   Fibromyalgia 01/11/2010   PCP: Domenica Harlene LABOR, MD  REFERRING PROVIDER: Barton Drape, MD (peroneal tendinitis)  Bonner Ade, MD (radiculopathy)   REFERRING DIAG: M76.72 (ICD-10-CM) - Peroneal tendinitis, left leg   M54.16 (ICD-10-CM) - Radiculopathy, lumbar region   THERAPY DIAG:  Muscle weakness (generalized)  Unsteadiness on feet  Other abnormalities of gait and mobility  Rationale for Evaluation and Treatment: Rehabilitation  ONSET DATE: 03/12/24  SUBJECTIVE:  SUBJECTIVE STATEMENT: The patient reports the fatigue is continuing since her recent trip to her grandson's wedding. She reports my head is just not right, I don't know what is going on. She feels like her neck is creaking and she has had a HA for 7 days. Her BP was 90/67 at  MD visit this morning. The patient is unsure if she is dehydrated and it is leading to how she is feeling. She reports her head feels fuzzy and nothing is clear. She sometimes wakes this way and cannot get moving. The patient reports she has been on trazadone since 1980s since her husband died in an accident-- her primary care doctor is trying to modify medications due to med interactions. We discussed PT plan and patient feels a benefit  from coming to therapy, but has limitations with carryover at this time due to how she is feeling.  PERTINENT HISTORY: S/p Lt ankle arthroscopy Brostrom stabilization, peroneus brevis and longus debridement and tenosynovectomy 03/12/24 See extensive PMH above   PAIN:  Are you having pain? Yes: NPRS scale:  8-9/10 Pain location:  low/mid back Pain description: stabbing Aggravating factors: walking, standing,sitting Relieving factors: heat, lying flat   Are you having pain? Yes: NPRS scale: 5/10 hurts with WB at time sand pushing on lateral ankle Pain location: lateral aspect of left foot Pain description: sore Aggravating factors: pressure, WB Relieving factors: unsure   Are you having pain? Yes: NPRS scale: 8/10 Pain location: elbows + bilateral carpal tunner (my hands are going crazy) Pain description: constant hurt, warmth, feels deep Aggravating factors: gentle touch, a sheet against her skin Relieving factors: nothing  PRECAUTIONS: Fall,progress per protocol; osteoporosis   WEIGHT BEARING RESTRICTIONS: No  FALLS:  Has patient fallen in last 6 months? Yes. Number of falls 2 (fell stepping up grassy hill, fell in her kitchen from turning quickly)  LIVING ENVIRONMENT: Lives with: lives with their spouse Lives in: House/apartment Stairs: Yes: Internal: 16 steps; on left going up Has following equipment at home: Single point cane  PATIENT GOALS: I want to be able to walk again.  NEXT MD VISIT: November 2025   OBJECTIVE:  Note:  Objective measures were completed at Evaluation unless otherwise noted.  DIAGNOSTIC FINDINGS: none on file   PATIENT SURVEYS:  FADI: 46/104; 44% 06/26/24: FADI: 67/100; 67%  08/11/24: modified oswestry 30/50; 60% disability   COGNITION: Overall cognitive status: Within functional limits for tasks assessed     SENSATION: See palpation  EDEMA:  Mild swelling about Lt ankle   PALPATION: Hypersensitivity to palpation about distal fibula, lateral malleolus   LUMBAR ROM:  Active  A/PROM  eval  Flexion 50% limited LBP and RLE pain   Extension WNL LBP  Right lateral flexion 25% limited  Left lateral flexion 25% limited   Right rotation WNL  Left rotation WNL   (Blank rows = not tested)  LOWER EXTREMITY ROM: Active ROM Right eval Left eval 05/05/24 Left  05/20/24 Left  06/03/24 Left   Hip flexion       Hip extension       Hip abduction       Hip adduction       Hip internal rotation       Hip external rotation       Knee flexion       Knee extension       Ankle dorsiflexion  6 9  10   Ankle plantarflexion  50  65 67  Ankle inversion  15  15 17   Ankle eversion  8  8 9    (Blank rows = not tested)  LOWER EXTREMITY MMT: MMT Right eval Left eval 06/26/24 Left  08/04/24 Left  08/11/24   Hip flexion     Rt: 4; Lt: 4-  Hip extension       Hip abduction     4 bilateral   Hip adduction       Hip internal rotation       Hip external rotation       Knee flexion       Knee extension       Ankle dorsiflexion  3+ 4+ 4+   Ankle plantarflexion  3+ sitting 4- in sitting 4 in sitting    Ankle inversion  3+  4- 4   Ankle eversion  3+ 4 5    (Blank rows = not tested)  FUNCTIONAL TESTS:  TUG: 13.5 seconds Romberg x 30 seconds Semi-tandem unable   06/09/24: TUG: 11.1 seconds   06/26/24: tandem 1 second   08/11/24: Rt rolling requires use of UE support on the hip to roll, increased pain   GAIT: Distance walked: 20 ft  Assistive device utilized: Single point cane. Ankle  brace Level of assistance: Modified independence Comments: limited push-off LLE, foot ER,   [facility] Adult PT Treatment:                                                DATE: 10/08/24 Therapeutic Activity: Sit<>stand x 5 reps-- patient demonstrates without UE use Standing heel raises Standing toe raises Standing gastroc stretch *Performed these activities as part of general strength and mobility plan Discussed doing daily movement and then adding in VOR (for dizziness), foot towel scrunches (for foot pain), lumbar stabilization (for back pain) if those specific symptoms arise We discussed plan to hold therapy for 2 weeks while adjusting meds and recovering from recent trip-- then we will check HEP and progress difficulty if needed Discussed referral for elbows-- see assessment BP=104/65 Gait: Patient unsteady upon rising today and is mod indep with SPC and CGA without device for short distances in the clinic   [facility] Adult PT Treatment:                                                DATE: 09/24/24 Therapeutic Exercise: Back extensions attempted however pt could not complete  Neuromuscular re-ed: Adductor squeeze with ball between knees x20 Ball press squeeze for TA activation in seated x8  Self Care: Educated on use of TENS and how it can be used for self pain management at home Recommended  urology follow up    [facility] Adult PT Treatment:                                                DATE: 09/22/24 PHYSICAL THERAPY RE-EVALUATION: Modified disability = 54% (was 60%) Palpation of L lateral ankle with pain that shoots along border of foot 5 time sit<>stand=22.37 seconds today Pain is present in low back during sit<>supine Discussed daily activities around her home and what is limited== patient notes feeling greater unsteadiness today-- this can vary; she also has intermittent L foot pain and low back pain.  Therapeutic Exercise Toe extension alternating big toe and other 4 digits (toe  yoga) Used a ball under the ball of her foot to add toe spreading as toe yoga activities-- unable today Neuromuscular re-ed: Tandem=1 second R and L-- difficulty dec'ing UE support today Standing narrow base of support is challenging today with patient reaching for hand held support Rolling R<>L without dizziness    [facility] Adult PT Treatment:  DATE: 09/17/24 Therapeutic Exercise: Standing lumbar extension 2 x 10  Reviewed HEP- discussing centralization of pain as it relates to repeated movement  Therapeutic Activity: Hip hinge at wall with foam roller x 10   Self Care: Discussed use of TENS unit for pain modulation with recommendation to bring in her TENS unit at next visit to discuss proper setup and application.  Reviewed bending/lifting mechanics as part of previously provided handout   PATIENT EDUCATION:  Education details:see treatment  Person educated: Patient Education method: Explanation,  Education comprehension: verbalized understanding  HOME EXERCISE PROGRAM: Access Code: [email or web address] URL: [email or web address] Date: 10/08/2024 Prepared by: Tawni Ferrier  Exercises - Sit to Stand  - 1 x daily - 7 x weekly - 1 sets - 10 reps - Toe Raises with Counter Support  - 1 x daily - 7 x weekly - 2 sets - 10 reps - Heel Raises with Counter Support  - 1 x daily - 7 x weekly - 2 sets - 10 reps - Standing Hip Abduction with Counter Support  - 1 x daily - 7 x weekly - 2 sets - 10 reps - Gastroc Stretch on Wall  - 1 x daily - 7 x weekly - 3 sets - 30 sec  hold - Standing Lumbar Extension  - 6 x daily - 7 x weekly - 1 sets - 10 reps - Towel Scrunches  - 1 x daily - 7 x weekly - 1 sets - 5 reps - Standing Gaze Stabilization with Head Rotation  - 1 x daily - 7 x weekly - 2 sets - 10 reps - Hooklying Clamshell with Resistance  - 1 x daily - 7 x weekly - 2 sets - 10 reps - Supine Hip Adduction Isometric with Ball  - 1 x daily -  7 x weekly - 2 sets - 10 reps - 5 Minute Guided Meditation  - 7 x weekly  Patient Education - Treating Persistent Pain Without Medications: Mindfulness  ASSESSMENT:  CLINICAL IMPRESSION: The patient has a new order for elbow pain-- we discussed today but decided to not currently assess (can do at an upcoming visit). We decided instead to instruct/review general mobility activities to maintain strength -- and then use other exercises for foot, vestibular, back as needed. PT to continue working towards STGs/LTGs recently established and will consider assessing elbows as indicated. Plan to hold 2 weeks while patient is adjusting meds and encouraged her to perform safe mobility in the home to maintain strength.   EVAL: Patient is a 78 y.o. female who was seen today for physical therapy evaluation and treatment for s/p Lt ankle arthroscopy Brostrom stabilization, peroneus brevis and longus debridement and tenosynovectomy on 03/12/24. She demonstrates ROM, strength, gait and balance deficits that are consistent with her recent post-operative status. She will benefit from skilled PT to address the above stated deficits in order to return to optimal function.   OBJECTIVE IMPAIRMENTS: Abnormal gait, decreased activity tolerance, decreased balance, decreased endurance, decreased knowledge of condition, decreased mobility, difficulty walking, decreased ROM, decreased strength, increased edema, impaired flexibility, impaired sensation, improper body mechanics, postural dysfunction, and pain.   GOALS: Goals reviewed with patient? Yes  LONG TERM GOALS: Target date: 09/20/24  Patient will score >/= 60/104 on the Foot and Ankle Disability Index (MDC 7) to signify clinically meaningful improvement in functional abilities.  Baseline: see above Goal status: MET  2.  Patient will maintain tandem stance for at least 10 seconds to improve gait stability.  Baseline:  see above 06/26/24: tandem 1 second 08/04/24:  tandem 4 second Goal status:NOT MET-- 1 second on 09/23/24  3.  Patient will be modified independent with curb/stair negotiation.  Baseline: uses cane and requires support from spouse  06/26/24: requires cane  Goal status: MET  4.  Patient will self-report ability to tolerate at least 30 minutes of standing/walking activity.  Baseline: 10 minutes  06/26/24: 20 minutes  08/04/24: limited due to back pain and dizziness, 10 minutes  Goal status: NOT MET  5.  Patient will demonstrate at least 4/5 Lt ankle strength to improve stability when navigating on uneven terrain.  Baseline: see above  Goal status: MET  6.  Patient will be independent with advanced home program to progress/maintain current level of function.  Baseline: initial HEP issued  Goal status: MET   7. Patient will tolerate rolling R and L without d/o dizziness.   Baseline: 4/10 dizzines 08/04/24: 4/10 dizziness baseline    Goal status: MET 09/22/24: baseline dizziness today is 1/10  (began again this weekend) Rolling is so minute I don't think it's dizziness   8. Patient will tolerate gaze x 1 viewing in sitting x 20 seconds to work towards improved tolerance to turns in standing.   Baseline: Unable to do 2 reps in sitting   08/04/24: VOR x 1, 20 seconds    Goal status: MET   9. Patient will score </= 40 % disability on the Oswestry Disability Index (MCID is 20%) to signify clinically meaningful improvement in functional abilities.    Baseline: see above   Goal status: NOT MET-- 54%  10. Patient will demonstrate proper lifting mechanics to improve her tolerance to loading/unloading the dishwasher.    Baseline: increased pain, limits bending activity   Goal status: NOT MET-- this task is still challenging at home  UPDATED GOALS:  SHORT TERM GOALS: Target date: 10/22/24  Patient will demonstrate use of TENS unit for home to manage chronic low back pain. Baseline: to bring next visit Goal status: NEW GOAL  2.  Patient  will improve 5 time sit<>stand to < or equal to 18 seconds.  Baseline: 22.53 seconds Goal status: NEW GOAL  LONG TERM GOALS: Target date: 11/22/24  Patient will be indep with HEP progression.  Baseline: has HEP will need progression Goal status: UPDATED GOAL DATE  2.  Patient will maintain tandem stance for at least 10 seconds to improve gait stability.  Baseline: 1 second on 09/23/24 Goal status: UPDATED GOAL DATE  3.  Patient will score </= 40 % disability on the Oswestry Disability Index (MCID is 20%) to signify clinically meaningful improvement in functional abilities.   Baseline: see above  Goal status: UPDATED GOAL DATE (54% on 09/22/24)  4.  Patient will self-report ability to tolerate at least 30 minutes of standing/walking activity.  Baseline: 10 minutes  06/26/24: 20 minutes  08/04/24: limited due to back pain and dizziness, 10 minutes; also unable on 09/23/24 Goal status: UPDATED GOAL DATE  PLAN:  PT FREQUENCY: 2x/week  PT DURATION: 6 weeks  PLANNED INTERVENTIONS: 97164- PT Re-evaluation, 97750- Physical Performance Testing, 97110-Therapeutic exercises, 97530- Therapeutic activity, 97112- Neuromuscular re-education, 97535- Self Care, [id number]- Manual therapy, [id number]- Gait training, [phone]- Aquatic Therapy, [id number]- Vasopneumatic device, Taping, Dry Needling, Cryotherapy, and Moist heat  PLAN FOR NEXT SESSION: review and progress HEP; assess elbows as needed and add goals (general strengthening plan), consider using [place] fall prevention program as general mobility (has components already) and then have adjunct exercises depending  on what is flared.  Ladavia Lindenbaum, PT 10/08/24 10:32 AM

## 2024-10-09 ENCOUNTER — Telehealth: Payer: Self-pay | Admitting: Rehabilitative and Restorative Service Providers"

## 2024-10-09 LAB — IRON,TIBC AND FERRITIN PANEL
%SAT: 32 % (ref 16–45)
Ferritin: 120 ng/mL (ref 16–288)
Iron: 72 ug/dL (ref 45–160)
TIBC: 227 ug/dL — ABNORMAL LOW (ref 250–450)

## 2024-10-09 NOTE — Telephone Encounter (Signed)
 PT returned call-- patient had a question about our plan to consider 1x/week when she returns to PT (we are holding x 2 weeks due to some medication changes).  She wanted to be sure she had not caused a change in her frequency -- PT and patient discussed this as an option that we will consider when we reassess next visit.  We can consider 1 versus 2 times/week depending on her needs. Lamekia Nolden, PT

## 2024-10-10 ENCOUNTER — Encounter

## 2024-10-13 ENCOUNTER — Encounter: Payer: Self-pay | Admitting: Adult Health

## 2024-10-13 ENCOUNTER — Ambulatory Visit (INDEPENDENT_AMBULATORY_CARE_PROVIDER_SITE_OTHER)

## 2024-10-13 ENCOUNTER — Ambulatory Visit: Payer: Self-pay | Admitting: Adult Health

## 2024-10-13 ENCOUNTER — Ambulatory Visit: Admitting: Adult Health

## 2024-10-13 VITALS — BP 104/68 | HR 84 | Ht 65.0 in | Wt 128.0 lb

## 2024-10-13 DIAGNOSIS — J309 Allergic rhinitis, unspecified: Secondary | ICD-10-CM

## 2024-10-13 DIAGNOSIS — R301 Vesical tenesmus: Secondary | ICD-10-CM | POA: Diagnosis not present

## 2024-10-13 DIAGNOSIS — R399 Unspecified symptoms and signs involving the genitourinary system: Secondary | ICD-10-CM | POA: Diagnosis not present

## 2024-10-13 DIAGNOSIS — J441 Chronic obstructive pulmonary disease with (acute) exacerbation: Secondary | ICD-10-CM | POA: Diagnosis not present

## 2024-10-13 DIAGNOSIS — N183 Chronic kidney disease, stage 3 unspecified: Secondary | ICD-10-CM

## 2024-10-13 DIAGNOSIS — G4700 Insomnia, unspecified: Secondary | ICD-10-CM | POA: Diagnosis not present

## 2024-10-13 DIAGNOSIS — J45909 Unspecified asthma, uncomplicated: Secondary | ICD-10-CM | POA: Diagnosis not present

## 2024-10-13 DIAGNOSIS — J4489 Other specified chronic obstructive pulmonary disease: Secondary | ICD-10-CM | POA: Diagnosis not present

## 2024-10-13 MED ORDER — STIOLTO RESPIMAT 2.5-2.5 MCG/ACT IN AERS: 2.0000 | INHALATION_SPRAY | Freq: Every day | RESPIRATORY_TRACT | 5 refills | Status: DC

## 2024-10-13 MED ORDER — AZITHROMYCIN 250 MG PO TABS: ORAL_TABLET | ORAL | 1 refills | Status: AC

## 2024-10-13 MED ORDER — STIOLTO RESPIMAT 2.5-2.5 MCG/ACT IN AERS: INHALATION_SPRAY | RESPIRATORY_TRACT | Status: DC

## 2024-10-13 NOTE — Patient Instructions (Addendum)
 Chest xray today.  Zpack take as directed.  Liquid Mucinex  DM Twice daily  As needed  cough/congestion  Begin Stiolto 2 puffs daily.  Follow up in 4-6  weeks with PFT and As needed   Please contact office for sooner follow up if symptoms do not improve or worsen or seek emergency care

## 2024-10-13 NOTE — Progress Notes (Signed)
 @Patient  ID: [name], female    DOB: 1946-08-25, 78 y.o.   MRN: [id number]  Chief Complaint  Patient presents with   Medical Management of Chronic Issues    Referring provider: Domenica Harlene LABOR, MD  HPI: 78 year old female former smoker followed for COPD with asthma with an allergic phenotype and allergic rhinitis Medical history significant for hypertension, coronary artery disease, Sjogren's, breast cancer, interstitial cystitis    TEST/EVENTS : Reviewed 10/13/2024  PFT August 18, 2021 showed severe airflow obstruction with FEV1 at 57%, ratio 63, FVC 68%, significant bronchodilator response with a 26% change, DLCO 70%    Discussed the use of AI scribe software for clinical note transcription with the patient, who gave verbal consent to proceed.  History of Present Illness [name] is a 78 year old female with COPD and asthma who presents for a checkup due to worsening breathing.  Over the past three to five weeks, she has experienced worsening breathing, characterized by increased shortness of breath, particularly during activities such as climbing stairs or carrying groceries. She describes her wheezing as not deep and has had a low-grade fever for the past four days. No deep wheezing is reported.  Has been having thick mucus that is difficult to get up at times  Her COPD and asthma have been managed with inhalers like Advair and Flovent , but she discontinued Flovent  due to throat irritation. She has not been using inhalers recently. She has a history of smoking.  Previous pulmonary function testing showed moderate airflow obstruction.  She has a history of interstitial cystitis and recently had a urology appointment where her white blood cell count was elevated at 12,000, with some presence in her urine. Despite previous concerns, she ruled out for multiple myeloma by oncology.  Currently seeing a new rheumatologist for Sjogren's syndrome.  She is allergic to  prednisone  but has taken it with an EpiPen  on hand. She is allergic to multiple antibiotics but can take a Z-Pak. She is concerned about pneumonia due to a family history and her own past experiences.  She is currently taking Lasix  every three days and was recently started on Seroquel four days ago, which she reports is not helping her sleep. She has stage three kidney disease.     Allergies  Allergen Reactions   Clindamycin Hcl Shortness Of Breath and Rash   Levofloxacin  Rash    Other reaction(s): sick, rash hives severe itching   Other Other (See Comments)    Father had malignant hyperthermia   Penicillins Anaphylaxis   Rosuvastatin  Anaphylaxis   Baclofen  Rash   Codeine Hives and Nausea Only   Erythromycin Hives   Haemophilus Influenzae Vaccines Other (See Comments)    Local reaction at site   Latex Hives   Lincomycin Other (See Comments)   Pentazocine Lactate Other (See Comments)    HALLUCINATION   Pneumococcal Vaccine Polyvalent Hives, Swelling and Other (See Comments)    REACTION: swelling at injection site   Prednisone  Rash    Other reaction(s): Rash, Hives - can tolerate if she takes with benadryl     Tamoxifen  Nausea And Vomiting   Doxycycline  Rash   Fluzone [Influenza Virus Vaccine] Hives    Local reaction at the site   Haemophilus Influenzae Other (See Comments)    Local reaction at the site     Immunization History  Administered Date(s) Administered   Influenza Split 10/10/2011, 09/24/2012   Influenza Whole 10/18/2010   Influenza,inj,Quad PF,6+ Mos 10/13/2013, 09/25/2014  PFIZER(Purple Top)SARS-COV-2 Vaccination 01/29/2020, 02/23/2020, 09/18/2020, 07/25/2021   Pneumococcal Polysaccharide-23 01/27/2011   Tdap 10/21/2013, 06/01/2018   Zoster Recombinant(Shingrix ) 07/14/2019, 10/16/2019    Past Medical History:  Diagnosis Date   Abdominal pain 10/26/2013   Allergic rhinitis 01/25/2016   ANA positive 07/29/2013   Anemia    Arthralgia 08/18/2013    Arthritis    Asthma, allergic 08/05/2012   Atrial flutter    past history- not current   Atypical chest pain 01/01/2015   Bilateral carpal tunnel syndrome 03/04/2015   CAD in native artery 08/25/2010   Minor CAD 2009, 2011, low risk Myoview  2013 and 2016        Cataract    bilaterally removed    Cellulitis of breast 02/05/2023   Chronic constipation    Chronic cough 01/09/2017   Chronic interstitial cystitis 02/01/2017   Chronic night sweats 01/08/2015   Common bile duct dilatation 02/13/2023   Concussion    x 3   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   DDD (degenerative disc disease), cervical 09/17/2018   Degenerative scoliosis 04/22/2018   Deviated septum 05/12/2016   Dysphagia    Dyspnea    Dysuria 03/09/2021   Elevated alkaline phosphatase level 01/25/2023   Essential hypertension 08/25/2010   External hemorrhoids 02/05/2015   Facial trauma 01/22/2023   Family history of adverse reaction to anesthesia    Family history of malignant hyperthermia    father had this   Fibrocystic breast disease 10/18/2010   Fibromyalgia    Frequency of urination    Generalized anxiety disorder 08/03/2010   GERD (gastroesophageal reflux disease)    Headache 10/13/2013   High serum vitamin D  05/18/2021   History of chronic bronchitis    History of colonic polyp 02/25/2019   History of hiatal hernia    History of tachycardia    CONTROLLED  WITH ATENOLOL    Hyperglycemia 03/08/2021   Hyperkalemia 09/28/2022   Hyperlipidemia    Intertrigo 12/13/2022   Kidney stone 02/26/2019   Knee pain, right 04/30/2015   Left hip pain 04/30/2015   Leg pain 02/24/2011   Qualifier: Diagnosis of   By: Antonio DOJamee         Leukocytosis 07/05/2022   Local reaction to hymenoptera sting 05/09/2023   Low back pain 09/08/2020   Major depressive disorder 02/05/2013   Malignant neoplasm of lower-outer quadrant of right breast of female, estrogen receptor positive 01/03/2013   Nasal turbinate hypertrophy  05/12/2016   Oral herpes 02/05/2023   OSA and COPD overlap syndrome 06/10/2020   Osteoporosis 01/2019   T score -2.2 stable/improved from prior study   Pelvic pain    Peripheral neuropathy 05/22/2014   Personal history of radiation therapy    Pneumonia    Primary insomnia 06/25/2017   Pulmonary nodules 02/12/2015   Rash and other nonspecific skin eruption 05/09/2023   Recurrent falls 05/02/2020   Recurrent sinusitis 02/25/2019   Right arm pain 07/20/2016   RLS (restless legs syndrome) 11/09/2019   RUQ pain 07/05/2022   S/P radiation therapy 11/12/12 - 12/05/12   right Breast   Sciatica 04/30/2015   Sepsis 2014   from UTI    Sjogren's disease 12/12/2021   Splenic lesion 09/02/2012   Sprain of lateral ligament of ankle joint 07/31/2023   Stage 3 chronic kidney disease    Statin intolerance 02/29/2020   Stricture and stenosis of esophagus 10/19/2011   Tongue lesion 02/05/2023   Tremor 07/05/2022   Urgency of urination  Vertigo 01/22/2023   Vitamin B12 deficiency 03/08/2021   Vocal cord cyst 05/18/2021    Tobacco History: Social History   Tobacco Use  Smoking Status Former   Current packs/day: 0.00   Average packs/day: 1 pack/day for 15.0 years (15.0 ttl pk-yrs)   Types: Cigarettes   Start date: 05/27/1990   Quit date: 05/27/2005   Years since quitting: 19.3   Passive exposure: Never  Smokeless Tobacco Never   Counseling given: Not Answered   Outpatient Medications Prior to Visit  Medication Sig Dispense Refill   acetaminophen  (TYLENOL ) 500 MG tablet Take 1,000 mg by mouth every 6 (six) hours as needed for mild pain or headache.      albuterol  (VENTOLIN  HFA) 108 (90 Base) MCG/ACT inhaler Inhale 2 puffs into the lungs every 4 (four) hours as needed for wheezing or shortness of breath (coughing fits). 18 g 1   amLODipine  (NORVASC ) 5 MG tablet Take 1 tablet by mouth once daily 90 tablet 3   aspirin  EC 81 MG tablet Take 1 tablet (81 mg total) by mouth daily. 90 tablet  3   atenolol  (TENORMIN ) 25 MG tablet Take 1 tablet (25 mg total) by mouth 2 (two) times daily. 180 tablet 1   carboxymethylcellul-glycerin (OPTIVE) 0.5-0.9 % ophthalmic solution Place 1 drop into both eyes daily as needed for dry eyes.     Cholecalciferol  (VITAMIN D3) 50 MCG (2000 UT) CAPS Take 2,000 Units by mouth daily.     cyanocobalamin  (VITAMIN B12) 1000 MCG tablet Take 1,000 mg by mouth daily with breakfast.     diclofenac Sodium (VOLTAREN) 1 % GEL Apply 2 g topically daily as needed (Back pain).     DULoxetine  (CYMBALTA ) 60 MG capsule Take 1 capsule by mouth twice daily 180 capsule 0   EPINEPHrine  0.3 mg/0.3 mL IJ SOAJ injection Inject 0.3 mg into the muscle as needed for anaphylaxis. 2 each 1   fluticasone  (FLONASE ) 50 MCG/ACT nasal spray Place 2 sprays into both nostrils daily. 16 g 5   fluticasone  (FLOVENT  HFA) 220 MCG/ACT inhaler Inhale 1-2 puffs into the lungs in the morning and at bedtime. 1 each 12   folic acid  (FOLVITE ) 1 MG tablet Take 1 tablet (1 mg total) by mouth daily.     furosemide  (LASIX ) 20 MG tablet TAKE 1 TABLET BY MOUTH AS NEEDED FOR EDEMA (Patient taking differently: Take 20 mg by mouth. Once every 3 days) 90 tablet 3   gabapentin  (NEURONTIN ) 300 MG capsule TAKE 2 CAPSULES BY MOUTH IN THE MORNING AND 1 IN THE AFTERNOON AND 3 AT BEDTIME 180 capsule 1   hydrOXYzine  (ATARAX ) 10 MG tablet Take 0.5-2 tablets (5-20 mg total) by mouth 3 (three) times daily as needed for anxiety (not with Loratadine ). 90 tablet 2   linaclotide  (LINZESS ) 145 MCG CAPS capsule Take 1 capsule (145 mcg total) by mouth daily before breakfast. 30 capsule 5   loratadine  (CLARITIN ) 10 MG tablet Take 10 mg by mouth daily as needed for rhinitis.     nitroGLYCERIN  (NITROSTAT ) 0.4 MG SL tablet Place 1 tablet (0.4 mg total) under the tongue every 5 (five) minutes x 3 doses as needed for chest pain. 25 tablet 2   nystatin  cream (MYCOSTATIN ) Apply 1 application topically 2 (two) times daily. 30 g 2    pantoprazole  (PROTONIX ) 40 MG tablet Take 1 tablet by mouth 2 (two) times daily.     polyethylene glycol (MIRALAX ) 17 g packet Take 17 g by mouth 2 (two) times daily. 14  each 0   QUEtiapine (SEROQUEL) 25 MG tablet Take 1 tablet (25 mg total) by mouth at bedtime. Take  tablet (12.5 mg) by mouth at bedtime. May increase to 1 full tablet (25 mg) nightly as needed. 30 tablet 1   REPATHA  SURECLICK 140 MG/ML SOAJ INJECT CONTENTS OF 1 PEN SUBCUTANEOUSLY EVERY 14 DAYS 6 mL 0   traMADol  (ULTRAM ) 50 MG tablet Take 1 tablet by mouth 3 (three) times daily as needed.     Vitamin D , Ergocalciferol , (DRISDOL ) 1.25 MG (50000 UNIT) CAPS capsule Take 1 capsule by mouth once a week 4 capsule 0   sucralfate  (CARAFATE ) 1 GM/10ML suspension Take 10 mLs (1 g total) by mouth 4 (four) times daily -  with meals and at bedtime. (Patient not taking: Reported on 10/13/2024) 420 mL 1   Vonoprazan Fumarate  (VOQUEZNA ) 10 MG TABS Take 1 capsule by mouth daily. 30 tablet 1   No facility-administered medications prior to visit.     Review of Systems:   Constitutional:   No  weight loss, night sweats,  Fevers, chills, +_fatigue, or  lassitude.  HEENT:   No headaches,  Difficulty swallowing,  Tooth/dental problems, or  Sore throat,                No sneezing, itching, ear ache, nasal congestion, post nasal drip,   CV:  No chest pain,  Orthopnea, PND, swelling in lower extremities, anasarca, dizziness, palpitations, syncope.   GI  No heartburn, indigestion, abdominal pain, nausea, vomiting, diarrhea, change in bowel habits, loss of appetite, bloody stools.   Resp: .  No chest wall deformity  Skin: no rash or lesions.  GU: +frequency.  No flank pain, no hematuria   MS:  No joint pain or swelling.  No decreased range of motion.  No back pain.    Physical Exam  BP 104/68   Pulse 84   Ht 5' 5 (1.651 m)   Wt 128 lb (58.1 kg)   SpO2 97%   BMI 21.30 kg/m   GEN: A/Ox3; pleasant , NAD, well nourished    HEENT:   [place]/AT,  NOSE-clear, THROAT-clear, no lesions, no postnasal drip or exudate noted.   NECK:  Supple w/ fair ROM; no JVD; normal carotid impulses w/o bruits; no thyromegaly or nodules palpated; no lymphadenopathy.    RESP  Clear  P & A; w/o, wheezes/ rales/ or rhonchi. no accessory muscle use, no dullness to percussion  CARD:  RRR, no m/r/g, no peripheral edema, pulses intact, no cyanosis or clubbing.  GI:   Soft & nt; nml bowel sounds; no organomegaly or masses detected.   Musco: Warm bil, no deformities or joint swelling noted.   Neuro: alert, no focal deficits noted.    Skin: Warm, no lesions or rashes    Lab Results: Reviewed  CBC    Component Value Date/Time   WBC 12.1 (H) 10/08/2024 0947   RBC 4.80 10/08/2024 0947   HGB 14.2 10/08/2024 0947   HGB 14.3 03/04/2024 1042   HGB 11.8 05/25/2023 1231   HGB 11.3 (L) 04/20/2015 1050   HCT 44.0 10/08/2024 0947   HCT 36.3 05/25/2023 1231   HCT 36.0 04/20/2015 1050   PLT 375.0 10/08/2024 0947   PLT 264 03/04/2024 1042   PLT 348 05/25/2023 1231   MCV 91.6 10/08/2024 0947   MCV 85 05/25/2023 1231   MCV 90.9 04/20/2015 1050   MCH 30.4 03/04/2024 1042   MCHC 32.2 10/08/2024 0947   RDW 15.8 (H)  10/08/2024 0947   RDW 15.8 (H) 05/25/2023 1231   RDW 15.9 (H) 04/20/2015 1050   LYMPHSABS 2.2 10/08/2024 0947   LYMPHSABS 2.1 05/25/2023 1231   LYMPHSABS 1.3 04/20/2015 1050   MONOABS 1.1 (H) 10/08/2024 0947   MONOABS 0.5 04/20/2015 1050   EOSABS 0.2 10/08/2024 0947   EOSABS 0.5 (H) 05/25/2023 1231   BASOSABS 0.1 10/08/2024 0947   BASOSABS 0.1 05/25/2023 1231   BASOSABS 0.0 04/20/2015 1050    BMET    Component Value Date/Time   NA 139 10/08/2024 0947   NA 138 05/25/2023 1231   NA 140 04/20/2015 1050   K 3.7 10/08/2024 0947   K 3.9 04/20/2015 1050   CL 97 10/08/2024 0947   CL 103 11/12/2012 1549   CO2 31 10/08/2024 0947   CO2 26 04/20/2015 1050   GLUCOSE 107 (H) 10/08/2024 0947   GLUCOSE 82 04/20/2015 1050   GLUCOSE 103  (H) 11/12/2012 1549   BUN 21 10/08/2024 0947   BUN 13 05/25/2023 1231   BUN 13.1 04/20/2015 1050   CREATININE 1.36 (H) 10/08/2024 0947   CREATININE 1.23 (H) 03/04/2024 1042   CREATININE 1.1 04/20/2015 1050   CALCIUM  9.4 10/08/2024 0947   CALCIUM  8.9 04/20/2015 1050   GFRNONAA 68 04/07/2024 0733   GFRNONAA 51 (L) 07/27/2014 1227   GFRAA 66 09/03/2018 1114   GFRAA 59 (L) 07/27/2014 1227    BNP No results found for: BNP  ProBNP    Component Value Date/Time   PROBNP 502.6 (H) 06/03/2012 1305    Imaging: No results found.  Administration History     None          Latest Ref Rng & Units 08/18/2021    8:39 AM  PFT Results  FVC-Pre L 1.42   FVC-Predicted Pre % 50   FVC-Post L 1.94   FVC-Predicted Post % 68   Pre FEV1/FVC % % 68   Post FEV1/FCV % % 63   FEV1-Pre L 0.96   FEV1-Predicted Pre % 45   FEV1-Post L 1.22   DLCO uncorrected ml/min/mmHg 13.72   DLCO UNC% % 70   DLCO corrected ml/min/mmHg 14.33   DLCO COR %Predicted % 73   DLVA Predicted % 115   TLC L 4.80   TLC % Predicted % 93   RV % Predicted % 127     No results found for: NITRICOXIDE      Assessment & Plan:   Assessment and Plan Assessment & Plan Chronic obstructive pulmonary disease (COPD) and asthma  -acute exacerbation She has significant COPD and asthma with a exacerbation, marked by increased dyspnea and wheezing over the past 3-5 weeks. Previous inhaler therapy was discontinued due to throat irritation from inhaled steroids. Current symptoms indicate poorly managed COPD. Multiple medication allergies complicate treatment. Start Stiolto inhaler, two puffs once daily, to manage COPD Provide a sample of Stiolto before filling the prescription to ensure tolerance. Order a chest x-ray to assess current lung status. Hold Seroquel while on Z-Pak to avoid potential interactions.  Check PFTs on return visit  Acute bronchitis   Acute bronchitis is likely secondary to the COPD exacerbation, with  symptoms of congestion and low-grade fever. Limited antibiotic options exist due to multiple allergies, with Z-Pak (azithromycin ) being the only suitable antibiotic. Prescribe Z-Pak with one refill, to be used if symptoms persist. Recommend Robitussin DM or liquid Mucinex  to help loosen mucus and aid in lung drainage.  Stage 3 chronic kidney disease (CKD)   She  has stage 3 CKD, emphasizing the need to maintain hydration and avoid nephrotoxic medications like ibuprofen. Blood pressure management is crucial. Advise maintaining adequate fluid intake and avoiding NSAIDs like ibuprofen.  Chronic insomnia and anxiety-recently changed to Seroquel few days ago.  She was aware to hold this until she has completed the Z-Pak.  She has multiple medication intolerances and is only able to take a Z-Pak.  Once done with Z-Pak can resume Seroquel after 24 hours.    I spent  38  minutes dedicated to the care of this patient on the date of this encounter to include pre-visit review of records, face-to-face time with the patient discussing conditions above, post visit ordering of testing, clinical documentation with the electronic health record, making appropriate referrals as documented, and communicating necessary findings to members of the patients care team.        Madelin Stank, NP 10/13/2024

## 2024-10-14 ENCOUNTER — Ambulatory Visit: Admitting: Professional

## 2024-10-15 ENCOUNTER — Telehealth: Payer: Self-pay

## 2024-10-15 ENCOUNTER — Other Ambulatory Visit: Payer: Self-pay | Admitting: Family Medicine

## 2024-10-15 ENCOUNTER — Encounter

## 2024-10-15 NOTE — Telephone Encounter (Signed)
 Copied from CRM [id number]. Topic: Clinical - Prescription Issue >> Oct 15, 2024 11:13 AM Alfonso ORN wrote: Reason for CRM: Pt called to ask advice on rx as advised by Pharmacy. Pt changed med to to quetiapime after visit with [facility] took med for 3 nights . Couldnt sleep and strange dreams.  [facility], Tammy wants to put pt on antibiotic but needs to be off of quetiapime before can take antibiotic. Pharm said pt doesnt need to take antibiotic because interferes with medication and antibiotic has been a problem with pt in past. And was advised to ask pcp for direction.  Please call patient back to advise. [phone] (M)

## 2024-10-16 NOTE — Telephone Encounter (Signed)
 Spoke w/ Pt- informed her of PCP recommendations. Pt verbalized understanding.

## 2024-10-16 NOTE — Telephone Encounter (Signed)
 Per Dr. Domenica-  It seems like she is not tolerating the seroquel anyway and she is on the lowest dose. So she can just hold it while she takes the antibiotics for her copd exacerbation after that she can decide if  she wants to restart the Seroquel

## 2024-10-19 NOTE — Assessment & Plan Note (Signed)
Statin intolerant. Encourage heart healthy diet such as MIND or DASH diet, increase exercise, avoid trans fats, simple carbohydrates and processed foods, consider a krill or fish or flaxseed oil cap daily.

## 2024-10-19 NOTE — Assessment & Plan Note (Signed)
 Stable follows with pulmonology. No changes

## 2024-10-19 NOTE — Assessment & Plan Note (Signed)
 Is following with Dr Leigh. Maintain adequate fiber and fluid intake

## 2024-10-19 NOTE — Assessment & Plan Note (Signed)
 hgba1c acceptable, minimize simple carbs. Increase exercise as tolerated.

## 2024-10-19 NOTE — Assessment & Plan Note (Signed)
Continues to follow with Hematology and she is getting iron transfusions

## 2024-10-19 NOTE — Assessment & Plan Note (Signed)
 Hydrate and monitor, has been flared for a couple of months. Follows with urology

## 2024-10-20 ENCOUNTER — Encounter: Payer: Self-pay | Admitting: Family Medicine

## 2024-10-20 ENCOUNTER — Ambulatory Visit (INDEPENDENT_AMBULATORY_CARE_PROVIDER_SITE_OTHER): Admitting: Family Medicine

## 2024-10-20 ENCOUNTER — Other Ambulatory Visit (HOSPITAL_BASED_OUTPATIENT_CLINIC_OR_DEPARTMENT_OTHER): Payer: Self-pay

## 2024-10-20 VITALS — BP 116/67 | HR 74 | Ht 65.0 in | Wt 130.0 lb

## 2024-10-20 DIAGNOSIS — M797 Fibromyalgia: Secondary | ICD-10-CM | POA: Diagnosis not present

## 2024-10-20 DIAGNOSIS — D509 Iron deficiency anemia, unspecified: Secondary | ICD-10-CM | POA: Diagnosis not present

## 2024-10-20 DIAGNOSIS — R739 Hyperglycemia, unspecified: Secondary | ICD-10-CM

## 2024-10-20 DIAGNOSIS — N3010 Interstitial cystitis (chronic) without hematuria: Secondary | ICD-10-CM

## 2024-10-20 DIAGNOSIS — E785 Hyperlipidemia, unspecified: Secondary | ICD-10-CM | POA: Diagnosis not present

## 2024-10-20 DIAGNOSIS — K5900 Constipation, unspecified: Secondary | ICD-10-CM

## 2024-10-20 DIAGNOSIS — J4489 Other specified chronic obstructive pulmonary disease: Secondary | ICD-10-CM

## 2024-10-20 MED ORDER — AREXVY 120 MCG/0.5ML IM SUSR
INTRAMUSCULAR | 0 refills | Status: DC
  Filled 2024-10-20: qty 1, 1d supply, fill #0

## 2024-10-20 NOTE — Progress Notes (Signed)
 Subjective:    Patient ID: [name], female    DOB: 05/20/1946, 78 y.o.   MRN: [id number]  No chief complaint on file.   HPI Discussed the use of AI scribe software for clinical note transcription with the patient, who gave verbal consent to proceed.  History of Present Illness [name] is a 78 year old female with chronic low back pain and interstitial cystitis who presents with worsening symptoms and management concerns.  She experiences persistent low back pain that has not improved despite receiving injections. Temporary relief is noted following the injections, particularly when prednisone  is administered, but the pain returns shortly after. The pain has been exacerbated since attending her grandson's wedding three weeks ago, and she has not fully recovered since.  She has been experiencing symptoms of interstitial cystitis for about two and a half months, including frequency, urgency, and burning. She has undergone five bladder instillations and has been prescribed Pyridium , but reports only partial relief. She is uncertain if these symptoms are related to her back issues or kidney disease.  She describes a generalized itching that began in August of the previous year, initially on her head and now affecting her arms, face, and lower abdomen. The itching is accompanied by small white bumps and is not related to heat exposure. She has tried changing shampoos without success and has not started any new medications recently. No new products or medications that could be causing her itching.  She has a history of elbow inflammation, confirmed by ultrasound, which causes significant pain when pressure is applied.  She has been on trazodone  for 40 years but recently tried Seroquel, which improved her sleep and mental clarity but caused a rash on her abdomen, leading to discontinuation.    Past Medical History:  Diagnosis Date   Abdominal pain 10/26/2013   Allergic rhinitis  01/25/2016   Allergy     ANA positive 07/29/2013   Anemia    Arthralgia 08/18/2013   Arthritis    Asthma, allergic 08/05/2012   Atrial flutter    past history- not current   Atypical chest pain 01/01/2015   Bilateral carpal tunnel syndrome 03/04/2015   CAD in native artery 08/25/2010   Minor CAD 2009, 2011, low risk Myoview  2013 and 2016        Cataract    bilaterally removed    Cellulitis of breast 02/05/2023   Chronic constipation    Chronic cough 01/09/2017   Chronic interstitial cystitis 02/01/2017   Chronic night sweats 01/08/2015   Common bile duct dilatation 02/13/2023   Concussion    x 3   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   DDD (degenerative disc disease), cervical 09/17/2018   Degenerative scoliosis 04/22/2018   Deviated septum 05/12/2016   Dysphagia    Dyspnea    Dysuria 03/09/2021   Elevated alkaline phosphatase level 01/25/2023   Essential hypertension 08/25/2010   External hemorrhoids 02/05/2015   Facial trauma 01/22/2023   Family history of adverse reaction to anesthesia    Family history of malignant hyperthermia    father had this   Fibrocystic breast disease 10/18/2010   Fibromyalgia    Frequency of urination    Generalized anxiety disorder 08/03/2010   GERD (gastroesophageal reflux disease)    Headache 10/13/2013   High serum vitamin D  05/18/2021   History of chronic bronchitis    History of colonic polyp 02/25/2019   History of hiatal hernia    History of tachycardia    CONTROLLED  WITH ATENOLOL    Hyperglycemia 03/08/2021   Hyperkalemia 09/28/2022   Hyperlipidemia    Intertrigo 12/13/2022   Kidney stone 02/26/2019   Knee pain, right 04/30/2015   Left hip pain 04/30/2015   Leg pain 02/24/2011   Qualifier: Diagnosis of   By: Antonio DOJamee         Leukocytosis 07/05/2022   Local reaction to hymenoptera sting 05/09/2023   Low back pain 09/08/2020   Major depressive disorder 02/05/2013   Malignant neoplasm of lower-outer quadrant of right  breast of female, estrogen receptor positive 01/03/2013   Nasal turbinate hypertrophy 05/12/2016   Oral herpes 02/05/2023   OSA and COPD overlap syndrome 06/10/2020   Osteoporosis 01/2019   T score -2.2 stable/improved from prior study   Pelvic pain    Peripheral neuropathy 05/22/2014   Personal history of radiation therapy    Pneumonia    Primary insomnia 06/25/2017   Pulmonary nodules 02/12/2015   Rash and other nonspecific skin eruption 05/09/2023   Recurrent falls 05/02/2020   Recurrent sinusitis 02/25/2019   Right arm pain 07/20/2016   RLS (restless legs syndrome) 11/09/2019   RUQ pain 07/05/2022   S/P radiation therapy 11/12/12 - 12/05/12   right Breast   Sciatica 04/30/2015   Sepsis 2014   from UTI    Sjogren's disease 12/12/2021   Splenic lesion 09/02/2012   Sprain of lateral ligament of ankle joint 07/31/2023   Stage 3 chronic kidney disease    Statin intolerance 02/29/2020   Stricture and stenosis of esophagus 10/19/2011   Tongue lesion 02/05/2023   Tremor 07/05/2022   Urgency of urination    Vertigo 01/22/2023   Vitamin B12 deficiency 03/08/2021   Vocal cord cyst 05/18/2021    Past Surgical History:  Procedure Laterality Date   24 HOUR PH STUDY N/A 04/03/2016   Procedure: 24 HOUR PH STUDY;  Surgeon: Elspeth Deward Naval, MD;  Location: [facility];  Service: Gastroenterology;  Laterality: N/A;   ANKLE ARTHROSCOPY WITH RECONSTRUCTION Left 03/12/2024   Procedure: ANKLE ARTHROSCOPY WITH OPEN LATERAL ANKLE LIGAMENT STABILIZATION;  Surgeon: Barton Drape, MD;  Location: [facility];  Service: Orthopedics;  Laterality: Left;   BREAST BIOPSY Right 08/23/2012   ADH   BREAST BIOPSY Right 10/11/2012   Ductal Carcinoma   BREAST EXCISIONAL BIOPSY Right 09/04/2013   benign   BREAST LUMPECTOMY Right 10/11/2012   W/ SLN BX   CARDIAC CATHETERIZATION  09-13-2007  DR MORRIS   WELL-PRESERVED LVF/  DIFFUSE SCATTERED CORONARY CALCIFACATION AND  ATHEROSCLEROSIS WITHOUT OBSTRUCTION   CARDIAC CATHETERIZATION  08-04-2010  DR MCALHANY   NON-OBSTRUCTIVE CAD/  pLAD 40%/  oLAD 30%/  mLAD 30%/  pRCA 30%/  EF 60%   CARDIOVASCULAR STRESS TEST  06-18-2012  DR McALHANY   LOW RISK NUCLEAR STUDY/  SMALL FIXED AREA OF MODERATELY DECREASED UPTAKE IN ANTEROSEPTAL WALL WHICH MAY BE ARTIFACTUAL/  NO ISCHEMIA/  EF 68%   COLONOSCOPY  09/29/2010   CYSTOSCOPY     CYSTOSCOPY WITH HYDRODISTENSION AND BIOPSY N/A 03/06/2014   Procedure: CYSTOSCOPY/HYDRODISTENSION/ INSTILATION OF MARCAINE  AND PYRIDIUM ;  Surgeon: Arlena LILLETTE Gal, MD;  Location: [facility];  Service: Urology;  Laterality: N/A;   CYSTOSCOPY/URETEROSCOPY/HOLMIUM LASER/STENT PLACEMENT Left 03/21/2023   Procedure: CYSTOSCOPY LEFT RETROGRADE PYELOGRAM, LEFT URETEROSCOPY, HOLMIUM LASER LITHOTRIPSYM, AND LEFT URETERAL STENT PLACEMENT;  Surgeon: Devere Lonni Righter, MD;  Location: [facility];  Service: Urology;  Laterality: Left;  60 MINUTES   ESOPHAGEAL MANOMETRY N/A 04/03/2016   Procedure: ESOPHAGEAL  MANOMETRY (EM);  Surgeon: Elspeth Deward Naval, MD;  Location: [facility];  Service: Gastroenterology;  Laterality: N/A;   EXTRACORPOREAL SHOCK WAVE LITHOTRIPSY Left 02/06/2019   Procedure: EXTRACORPOREAL SHOCK WAVE LITHOTRIPSY (ESWL);  Surgeon: Ottelin, Mark, MD;  Location: [facility];  Service: Urology;  Laterality: Left;   NASAL SINUS SURGERY  1985   ORIF RIGHT ANKLE  FX  2006   POLYPECTOMY     REMOVAL VOCAL CORD CYST  01/2013   REPAIR OF PERONEUS BREVIS TENDON Left 03/12/2024   Procedure: REPAIR OF PERONEUS TENDON;  Surgeon: Barton Drape, MD;  Location: [facility];  Service: Orthopedics;  Laterality: Left;   RIGHT BREAST BX  08/23/2012   RIGHT HAND SURGERY  X3  LAST ONE 2009   INCLUDES  ORIF RIGHT 5TH FINGER AND REVISION TWICE   SKIN CANCER EXCISION     face,arms   TONSILLECTOMY AND ADENOIDECTOMY  AGE 51   TOTAL ABDOMINAL HYSTERECTOMY W/ BILATERAL  SALPINGOOPHORECTOMY  1982   W/  APPENDECTOMY   TRANSTHORACIC ECHOCARDIOGRAM  06/24/2012   GRADE I DIASTOLIC DYSFUNCTION/  EF 55-60%/  MILD MR   UPPER GASTROINTESTINAL ENDOSCOPY      Family History  Problem Relation Age of Onset   Rectal cancer Mother    Colon cancer Mother    Pancreatic cancer Mother    Diabetes Mother    Breast cancer Mother 57   Breast cancer Maternal Aunt        breast   Birth defects Maternal Aunt    Irritable bowel syndrome Son    Heart disease Son        CAD, MV replacement   Allergic Disorder Daughter    Diabetes Daughter    Arthritis Daughter    Colon cancer Father    Colon polyps Father    Diabetes Father    Stroke Father    Heart disease Father        CHF   Hyperlipidemia Father    Hypertension Father    Arthritis Father    Breast cancer Paternal Aunt    Breast cancer Paternal Aunt    Arthritis Paternal Uncle    Allergic Disorder Daughter    Heart disease Cousin        CAD, had a blood clot after stenting   Arthritis Sister    Esophageal cancer Neg Hx    Stomach cancer Neg Hx     Social History   Socioeconomic History   Marital status: Married    Spouse name: Tanda    Number of children: 3   Years of education: 16   Highest education level: Bachelor's degree (e.g., BA, AB, BS)  Occupational History   Occupation: Retired    Comment: [employer]  Tobacco Use   Smoking status: Former    Current packs/day: 0.00    Average packs/day: 1 pack/day for 15.0 years (15.0 ttl pk-yrs)    Types: Cigarettes    Start date: 05/27/1990    Quit date: 05/27/2005    Years since quitting: 19.4    Passive exposure: Never   Smokeless tobacco: Never  Vaping Use   Vaping status: Never Used  Substance and Sexual Activity   Alcohol use: No    Alcohol/week: 0.0 standard drinks of alcohol   Drug use: No   Sexual activity: Not Currently    Partners: Male    Birth control/protection: Surgical    Comment: 1st intercourse 78 yo-Fewer than 5 partners, hysterectomy   Other Topics Concern   Not  on file  Social History Narrative   Lives with husband.   Caffeine use: 1/2 cup per day   Exercise-- 2days a week [employer],  Water  aerobics, walking       Retired engineer, civil (consulting)   No dietary restrictions, tries to maintain a heart healthy diet   Very pleasant lady   Social Drivers of Corporate Investment Banker Strain: Low Risk  (10/19/2024)   Overall Financial Resource Strain (CARDIA)    Difficulty of Paying Living Expenses: Not hard at all  Food Insecurity: No Food Insecurity (10/19/2024)   Hunger Vital Sign    Worried About Running Out of Food in the Last Year: Never true    Ran Out of Food in the Last Year: Never true  Transportation Needs: No Transportation Needs (10/19/2024)   PRAPARE - Administrator, Civil Service (Medical): No    Lack of Transportation (Non-Medical): No  Physical Activity: Insufficiently Active (10/19/2024)   Exercise Vital Sign    Days of Exercise per Week: 2 days    Minutes of Exercise per Session: 20 min  Stress: No Stress Concern Present (10/19/2024)   [employer] of Occupational Health - Occupational Stress Questionnaire    Feeling of Stress: Only a little  Social Connections: Socially Integrated (10/19/2024)   Social Connection and Isolation Panel    Frequency of Communication with Friends and Family: More than three times a week    Frequency of Social Gatherings with Friends and Family: Twice a week    Attends Religious Services: More than 4 times per year    Active Member of [employer] or Organizations: Yes    Attends Engineer, Structural: More than 4 times per year    Marital Status: Married  Catering Manager Violence: Not At Risk (03/04/2024)   Humiliation, Afraid, Rape, and Kick questionnaire    Fear of Current or Ex-Partner: No    Emotionally Abused: No    Physically Abused: No    Sexually Abused: No    Outpatient Medications Prior to Visit  Medication Sig Dispense Refill   acetaminophen   (TYLENOL ) 500 MG tablet Take 1,000 mg by mouth every 6 (six) hours as needed for mild pain or headache.      albuterol  (VENTOLIN  HFA) 108 (90 Base) MCG/ACT inhaler Inhale 2 puffs into the lungs every 4 (four) hours as needed for wheezing or shortness of breath (coughing fits). 18 g 1   amLODipine  (NORVASC ) 5 MG tablet Take 1 tablet by mouth once daily 90 tablet 3   aspirin  EC 81 MG tablet Take 1 tablet (81 mg total) by mouth daily. 90 tablet 3   atenolol  (TENORMIN ) 25 MG tablet Take 1 tablet (25 mg total) by mouth 2 (two) times daily. 180 tablet 1   carboxymethylcellul-glycerin (OPTIVE) 0.5-0.9 % ophthalmic solution Place 1 drop into both eyes daily as needed for dry eyes.     Cholecalciferol  (VITAMIN D3) 50 MCG (2000 UT) CAPS Take 2,000 Units by mouth daily.     cyanocobalamin  (VITAMIN B12) 1000 MCG tablet Take 1,000 mg by mouth daily with breakfast.     diclofenac Sodium (VOLTAREN) 1 % GEL Apply 2 g topically daily as needed (Back pain).     DULoxetine  (CYMBALTA ) 60 MG capsule Take 1 capsule by mouth twice daily 180 capsule 0   EPINEPHrine  0.3 mg/0.3 mL IJ SOAJ injection Inject 0.3 mg into the muscle as needed for anaphylaxis. 2 each 1   fluticasone  (FLONASE ) 50 MCG/ACT nasal spray Place 2  sprays into both nostrils daily. 16 g 5   fluticasone  (FLOVENT  HFA) 220 MCG/ACT inhaler Inhale 1-2 puffs into the lungs in the morning and at bedtime. 1 each 12   folic acid  (FOLVITE ) 1 MG tablet Take 1 tablet (1 mg total) by mouth daily.     furosemide  (LASIX ) 20 MG tablet TAKE 1 TABLET BY MOUTH AS NEEDED FOR EDEMA (Patient taking differently: Take 20 mg by mouth. Once every 3 days) 90 tablet 3   gabapentin  (NEURONTIN ) 300 MG capsule TAKE 2 CAPSULES BY MOUTH IN THE MORNING AND 1 IN THE AFTERNOON AND 3 AT BEDTIME 180 capsule 1   hydrOXYzine  (ATARAX ) 10 MG tablet Take 0.5-2 tablets (5-20 mg total) by mouth 3 (three) times daily as needed for anxiety (not with Loratadine ). 90 tablet 2   linaclotide  (LINZESS ) 145  MCG CAPS capsule Take 1 capsule (145 mcg total) by mouth daily before breakfast. 30 capsule 5   loratadine  (CLARITIN ) 10 MG tablet Take 10 mg by mouth daily as needed for rhinitis.     nitroGLYCERIN  (NITROSTAT ) 0.4 MG SL tablet Place 1 tablet (0.4 mg total) under the tongue every 5 (five) minutes x 3 doses as needed for chest pain. 25 tablet 2   nystatin  cream (MYCOSTATIN ) Apply 1 application topically 2 (two) times daily. 30 g 2   pantoprazole  (PROTONIX ) 40 MG tablet Take 1 tablet by mouth 2 (two) times daily.     polyethylene glycol (MIRALAX ) 17 g packet Take 17 g by mouth 2 (two) times daily. 14 each 0   QUEtiapine (SEROQUEL) 25 MG tablet Take 1 tablet (25 mg total) by mouth at bedtime. Take  tablet (12.5 mg) by mouth at bedtime. May increase to 1 full tablet (25 mg) nightly as needed. 30 tablet 1   REPATHA  SURECLICK 140 MG/ML SOAJ INJECT CONTENTS OF 1 PEN SUBCUTANEOUSLY EVERY 14 DAYS 6 mL 0   sucralfate  (CARAFATE ) 1 GM/10ML suspension Take 10 mLs (1 g total) by mouth 4 (four) times daily -  with meals and at bedtime. (Patient not taking: Reported on 10/13/2024) 420 mL 1   Tiotropium Bromide-Olodaterol (STIOLTO RESPIMAT) 2.5-2.5 MCG/ACT AERS Inhale 2 puffs into the lungs daily. 1 each 5   Tiotropium Bromide-Olodaterol (STIOLTO RESPIMAT) 2.5-2.5 MCG/ACT AERS 1 sample     traMADol  (ULTRAM ) 50 MG tablet Take 1 tablet by mouth 3 (three) times daily as needed.     Vitamin D , Ergocalciferol , (DRISDOL ) 1.25 MG (50000 UNIT) CAPS capsule Take 1 capsule by mouth once a week 4 capsule 0   No facility-administered medications prior to visit.    Allergies  Allergen Reactions   Clindamycin Hcl Shortness Of Breath and Rash   Levofloxacin  Rash    Other reaction(s): sick, rash hives severe itching   Other Other (See Comments)    Father had malignant hyperthermia   Penicillins Anaphylaxis   Rosuvastatin  Anaphylaxis   Baclofen  Rash   Codeine Hives and Nausea Only   Erythromycin Hives   Haemophilus  Influenzae Vaccines Other (See Comments)    Local reaction at site   Latex Hives   Lincomycin Other (See Comments)   Pentazocine Lactate Other (See Comments)    HALLUCINATION   Pneumococcal Vaccine Polyvalent Hives, Swelling and Other (See Comments)    REACTION: swelling at injection site   Prednisone  Rash    Other reaction(s): Rash, Hives - can tolerate if she takes with benadryl     Tamoxifen  Nausea And Vomiting   Doxycycline  Rash   Fluzone [Influenza Virus Vaccine]  Hives    Local reaction at the site   Haemophilus Influenzae Other (See Comments)    Local reaction at the site     Review of Systems  Constitutional:  Negative for fever and malaise/fatigue.  HENT:  Negative for congestion.   Eyes:  Negative for blurred vision.  Respiratory:  Negative for shortness of breath.   Cardiovascular:  Negative for chest pain, palpitations and leg swelling.  Gastrointestinal:  Negative for abdominal pain, blood in stool and nausea.  Genitourinary:  Positive for dysuria, frequency and urgency.  Musculoskeletal:  Negative for falls.  Skin:  Positive for rash.  Neurological:  Negative for dizziness, loss of consciousness and headaches.  Endo/Heme/Allergies:  Negative for environmental allergies.  Psychiatric/Behavioral:  Negative for depression. The patient is nervous/anxious and has insomnia.        Objective:    Physical Exam Constitutional:      General: She is not in acute distress.    Appearance: Normal appearance. She is well-developed. She is not toxic-appearing.  HENT:     Head: Normocephalic and atraumatic.     Right Ear: External ear normal.     Left Ear: External ear normal.     Nose: Nose normal.  Eyes:     General:        Right eye: No discharge.        Left eye: No discharge.     Conjunctiva/sclera: Conjunctivae normal.  Neck:     Thyroid : No thyromegaly.  Cardiovascular:     Rate and Rhythm: Normal rate and regular rhythm.     Heart sounds: Normal heart sounds.  No murmur heard. Pulmonary:     Effort: Pulmonary effort is normal. No respiratory distress.     Breath sounds: Normal breath sounds.  Abdominal:     General: Bowel sounds are normal.     Palpations: Abdomen is soft.     Tenderness: There is no abdominal tenderness. There is no guarding.  Musculoskeletal:        General: Normal range of motion.     Cervical back: Neck supple.  Lymphadenopathy:     Cervical: No cervical adenopathy.  Skin:    General: Skin is warm and dry.  Neurological:     Mental Status: She is alert and oriented to person, place, and time.  Psychiatric:        Mood and Affect: Mood normal.        Behavior: Behavior normal.        Thought Content: Thought content normal.        Judgment: Judgment normal.     BP 116/67   Pulse 74   Ht 5' 5 (1.651 m)   Wt 130 lb (59 kg)   SpO2 95%   BMI 21.63 kg/m  Wt Readings from Last 3 Encounters:  10/20/24 130 lb (59 kg)  10/13/24 128 lb (58.1 kg)  10/08/24 128 lb 6.4 oz (58.2 kg)    Diabetic Foot Exam - Simple   No data filed    Lab Results  Component Value Date   WBC 12.1 (H) 10/08/2024   HGB 14.2 10/08/2024   HCT 44.0 10/08/2024   PLT 375.0 10/08/2024   GLUCOSE 107 (H) 10/08/2024   CHOL 157 07/23/2024   TRIG 157.0 (H) 07/23/2024   HDL 49.50 07/23/2024   LDLDIRECT 74.0 06/12/2023   LDLCALC 76 07/23/2024   ALT 10 10/08/2024   AST 13 10/08/2024   NA 139 10/08/2024   K 3.7 10/08/2024  CL 97 10/08/2024   CREATININE 1.36 (H) 10/08/2024   BUN 21 10/08/2024   CO2 31 10/08/2024   TSH 5.49 10/08/2024   INR 1.05 08/03/2010   HGBA1C 5.3 04/21/2024   MICROALBUR 2.0 (H) 07/23/2024    Lab Results  Component Value Date   TSH 5.49 10/08/2024   Lab Results  Component Value Date   WBC 12.1 (H) 10/08/2024   HGB 14.2 10/08/2024   HCT 44.0 10/08/2024   MCV 91.6 10/08/2024   PLT 375.0 10/08/2024   Lab Results  Component Value Date   NA 139 10/08/2024   K 3.7 10/08/2024   CHLORIDE 102 04/20/2015    CO2 31 10/08/2024   GLUCOSE 107 (H) 10/08/2024   BUN 21 10/08/2024   CREATININE 1.36 (H) 10/08/2024   BILITOT 0.7 10/08/2024   ALKPHOS 96 10/08/2024   AST 13 10/08/2024   ALT 10 10/08/2024   PROT 6.6 10/08/2024   ALBUMIN 4.3 10/08/2024   CALCIUM  9.4 10/08/2024   ANIONGAP 8 03/04/2024   EGFR 50.0 08/15/2023   GFR 37.11 (L) 10/08/2024   Lab Results  Component Value Date   CHOL 157 07/23/2024   Lab Results  Component Value Date   HDL 49.50 07/23/2024   Lab Results  Component Value Date   LDLCALC 76 07/23/2024   Lab Results  Component Value Date   TRIG 157.0 (H) 07/23/2024   Lab Results  Component Value Date   CHOLHDL 3 07/23/2024   Lab Results  Component Value Date   HGBA1C 5.3 04/21/2024       Assessment & Plan:  Chronic interstitial cystitis Assessment & Plan: Hydrate and monitor    COPD with asthma (HCC) Assessment & Plan: Stable follows with pulmonology. No changes   Constipation, unspecified constipation type Assessment & Plan: Is following with Dr Leigh. Maintain adequate fiber and fluid intake   Hyperglycemia Assessment & Plan: hgba1c acceptable, minimize simple carbs. Increase exercise as tolerated.    Hyperlipidemia, unspecified hyperlipidemia type Assessment & Plan: Statin intolerant Encourage heart healthy diet such as MIND or DASH diet, increase exercise, avoid trans fats, simple carbohydrates and processed foods, consider a krill or fish or flaxseed oil cap daily.     Iron deficiency anemia, unspecified iron deficiency anemia type Assessment & Plan: Continues to follow with Hematology and she is getting iron transfusions     Assessment and Plan Assessment & Plan Low back pain Chronic low back pain exacerbated by recent travel. Current treatment with injections provides temporary relief, likely due to prednisone . Uncertainty about the effectiveness of current pain management strategy. - Discuss with pain management  specialist about the effectiveness of current treatment. - Consider alternative pain management strategies if current treatment is ineffective.  Interstitial cystitis Chronic interstitial cystitis with symptoms of frequency, urgency, and burning for over two months. Current treatment with Pyridium  provides incomplete relief. Possible autoimmune or neurologic etiology considered. - Consider D-mannose supplement to help heal bladder lining. - Hydrate well and follow a low inflammation diet, such as the Mediterranean diet. - Look up and follow a diet specific for interstitial cystitis.  Generalized pruritus Chronic generalized pruritus with itching on the head, arms, face, and lower abdomen. No new products or medications identified as triggers. Possible histaminic response or neurologic component considered. - Start Zyrtec (cetirizine) 10 mg daily for a few weeks to see if it helps with itching. - Ensure adequate hydration and consider fish oil or krill oil capsules for skin health.  Scaly skin lesions, possible  actinic keratosis Presence of scaly skin lesions, possibly actinic keratosis. Previous basal cell carcinoma noted. - Contact dermatology regarding scaly patches for evaluation and possible treatment.  Bilateral elbow inflammation Chronic bilateral elbow inflammation with pain upon contact. Ultrasound confirmed inflammation. - Proceed with physical therapy for bilateral elbow inflammation.  General Health Maintenance Discussion of vaccinations for pneumonia, RSV, COVID, and flu. Previous allergic reaction to Pneumovax noted. - Consider RSV vaccine, available at the pharmacy. - Consider COVID and flu vaccines if not already received. - Consider Prevnar 20 vaccine after other vaccines, given previous reaction to Pneumovax.  Recording duration: 29 minutes     Harlene Horton, MD

## 2024-10-20 NOTE — Patient Instructions (Addendum)
 Look up diet for interstitial cystitis and consider D Mannose OTC supplement Contact dermatology regarding skin Start Zyrtec/Cetirizine 10 mg daily for a few weeks to see if it helps the itching. Hydrate and low inflammation diet such as Mediterranean.  Consider taking a fish/krill/flaxseed oil cap daily  Stop the High dose weekly Vitamin D  and increase the daily Vitamin D3 OTC to 5000 international units daily  CBTi APP by [employer]  Annual covid and flu vaccines Prevnar 20 RSV, Respiratory Syncitial Virus vaccine, Arexvy   All at pharmacy  Interstitial Cystitis  Interstitial cystitis (IC) is inflammation of the bladder. It's also called painful bladder syndrome. It can cause pain near your bladder. It can also make you have to pee urgently and often. IC may flare up and then go away for a while. In some cases, it may become a long-term (chronic) problem. What are the causes? The cause of IC isn't known. What increases the risk? You may be more likely to get IC if: You're female. You have certain other conditions. These include: Fibromyalgia. Irritable bowel syndrome (IBS). Endometriosis. Chronic fatigue syndrome. You may have worse symptoms if: You're under a lot of stress. You smoke. You have certain foods or drinks. What are the signs or symptoms? Your symptoms may change over time. They may include: Discomfort or pain near your bladder. The pain may range from mild to very bad. You may have more or less pain as your bladder fills with pee and empties. Pain in your pelvic area. This is the area between your hip bones. Needing to pee often or all the time. Pain when you pee. Pain during sex. Blood in your pee. Tiredness. If you're female, your symptoms may get worse when you have your menstrual period. How is this diagnosed? IC is diagnosed based on your symptoms, your medical history, and an exam. Your health care provider may need to rule out other conditions.  They may do tests, such as: Pee tests. A cystoscopy. This test looks at the inside of your bladder. Biopsy. This is when a small piece of tissue is removed from your bladder for testing. How is this treated? There's no cure for IC. But treatment can help you manage your symptoms. Work with your provider to find the best treatments for you. These may include: Medicines to help with pain or to reduce how often you feel the need to pee. These may be given by mouth or put in your bladder using a soft tube called a catheter. Diet changes. Taking steps to manage stress. Physical therapy. This may include: Exercises. These can help you relax your pelvic floor muscles. Massage. This may be done to relax tight muscles. Bladder training. This is when you learn ways to control when you pee. Neuromodulation therapy. This uses a device that's put on your back. It blocks the nerves that cause you to feel pain near your bladder. A procedure to stretch your bladder. This may be done by filling your bladder with air or fluid. Surgery. This is rare. It's only done if other treatments don't help. Follow these instructions at home: Eating and drinking Make changes to your diet as told by your provider. You may need to avoid: Spicy foods. Foods with acid in them, like tomatoes or citrus. Foods with a lot of potassium in them. Avoid drinking alcohol and caffeine. These drinks can make you have to pee more. Lifestyle Learn and practice ways to relax. These may include deep breathing and muscle  relaxation. Get care for your mental well-being. You may need to: Do cognitive behavioral therapy (CBT). This therapy can change the way you think or act in response to things. It may help you feel better. See a therapist if you're depressed. Work with your provider on other ways to manage pain. Acupuncture may help. Do not use any products that contain nicotine or tobacco. These products include cigarettes, chewing  tobacco, and vaping devices, such as e-cigarettes. If you need help quitting, ask your provider. Bladder training  Do bladder training as told. You may need to: Pee at set, regular times. Train yourself to delay peeing. Keep a bladder diary. Write down: The times you pee. Any symptoms you have. The diary can help you find out what makes your symptoms worse. Use the diary to schedule times to pee. If you're away from home, plan to be near a bathroom at those times. Make sure you pee: Just before you leave the house. Just before you go to bed. General instructions Take over-the-counter and prescription medicines only as told by your provider. Try putting a warm or cool cloth called a compress over your bladder. This can help with pain. Avoid wearing tight clothes. Do exercises as told by your provider. Where to find more information [employer]: urologyhealth.org Interstitial Cystitis Association (ICA): tacosale.cz Contact a health care provider if: Your symptoms don't get better with treatment. Your pain or discomfort gets worse. You have to pee more often. You have little to no control over when you pee. You have a fever or chills. This information is not intended to replace advice given to you by your health care provider. Make sure you discuss any questions you have with your health care provider. Document Revised: 03/17/2023 Document Reviewed: 03/17/2023 Elsevier Patient Education  2024 [employer].
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
## 2024-10-22 ENCOUNTER — Ambulatory Visit (INDEPENDENT_AMBULATORY_CARE_PROVIDER_SITE_OTHER): Admitting: Professional

## 2024-10-22 ENCOUNTER — Encounter: Payer: Self-pay | Admitting: Professional

## 2024-10-22 DIAGNOSIS — F411 Generalized anxiety disorder: Secondary | ICD-10-CM | POA: Diagnosis not present

## 2024-10-22 DIAGNOSIS — F4310 Post-traumatic stress disorder, unspecified: Secondary | ICD-10-CM | POA: Diagnosis not present

## 2024-10-22 DIAGNOSIS — F331 Major depressive disorder, recurrent, moderate: Secondary | ICD-10-CM
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
## 2024-10-23 ENCOUNTER — Other Ambulatory Visit: Payer: Self-pay | Admitting: Family Medicine

## 2024-10-23 DIAGNOSIS — M5416 Radiculopathy, lumbar region: Secondary | ICD-10-CM | POA: Diagnosis not present

## 2024-10-27 ENCOUNTER — Telehealth (INDEPENDENT_AMBULATORY_CARE_PROVIDER_SITE_OTHER): Payer: Self-pay

## 2024-10-27 NOTE — Telephone Encounter (Signed)
 Pt called asking for the information for the [facility] office he was going to send Pt to, I gave Pt the doctors name and phone number.

## 2024-10-28 ENCOUNTER — Ambulatory Visit (INDEPENDENT_AMBULATORY_CARE_PROVIDER_SITE_OTHER): Admitting: Professional

## 2024-10-28 ENCOUNTER — Encounter: Payer: Self-pay | Admitting: Professional

## 2024-10-28 DIAGNOSIS — F411 Generalized anxiety disorder: Secondary | ICD-10-CM

## 2024-10-28 DIAGNOSIS — F331 Major depressive disorder, recurrent, moderate: Secondary | ICD-10-CM
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
## 2024-10-29 ENCOUNTER — Ambulatory Visit: Attending: Physical Medicine and Rehabilitation

## 2024-10-29 DIAGNOSIS — G5603 Carpal tunnel syndrome, bilateral upper limbs: Secondary | ICD-10-CM | POA: Diagnosis not present

## 2024-10-29 DIAGNOSIS — R2681 Unsteadiness on feet: Secondary | ICD-10-CM | POA: Insufficient documentation

## 2024-10-29 DIAGNOSIS — M5416 Radiculopathy, lumbar region: Secondary | ICD-10-CM | POA: Insufficient documentation

## 2024-10-29 DIAGNOSIS — M25521 Pain in right elbow: Secondary | ICD-10-CM | POA: Insufficient documentation

## 2024-10-29 DIAGNOSIS — M25572 Pain in left ankle and joints of left foot: Secondary | ICD-10-CM | POA: Insufficient documentation

## 2024-10-29 DIAGNOSIS — M25522 Pain in left elbow: Secondary | ICD-10-CM | POA: Insufficient documentation

## 2024-10-29 DIAGNOSIS — M6281 Muscle weakness (generalized): Secondary | ICD-10-CM | POA: Insufficient documentation

## 2024-10-29 DIAGNOSIS — R2689 Other abnormalities of gait and mobility: Secondary | ICD-10-CM | POA: Insufficient documentation

## 2024-10-29 NOTE — Therapy (Signed)
 OUTPATIENT PHYSICAL THERAPY TREATMENT  RE-EVALUATION Progress Note Reporting Period 08/18/24 to 10/29/24  See note below for Objective Data and Assessment of Progress/Goals.      Patient Name: [name] MRN: [id number] DOB:1946/05/26, 78 y.o., female Today's Date: 10/29/2024  END OF SESSION:  PT End of Session - 10/29/24 1221     Visit Number 29    Number of Visits 41    Date for Recertification  12/13/24    Authorization Type [employer]    Progress Note Due on Visit 39    PT Start Time 1105    PT Stop Time 1145    PT Time Calculation (min) 40 min    Activity Tolerance Patient tolerated treatment well    Behavior During Therapy [facility] for tasks assessed/performed            Past Medical History:  Diagnosis Date   Abdominal pain 10/26/2013   Allergic rhinitis 01/25/2016   Allergy     ANA positive 07/29/2013   Anemia    Arthralgia 08/18/2013   Arthritis    Asthma, allergic 08/05/2012   Atrial flutter    past history- not current   Atypical chest pain 01/01/2015   Bilateral carpal tunnel syndrome 03/04/2015   CAD in native artery 08/25/2010   Minor CAD 2009, 2011, low risk Myoview  2013 and 2016        Cataract    bilaterally removed    Cellulitis of breast 02/05/2023   Chronic constipation    Chronic cough 01/09/2017   Chronic interstitial cystitis 02/01/2017   Chronic night sweats 01/08/2015   Common bile duct dilatation 02/13/2023   Concussion    x 3   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   DDD (degenerative disc disease), cervical 09/17/2018   Degenerative scoliosis 04/22/2018   Deviated septum 05/12/2016   Dysphagia    Dyspnea    Dysuria 03/09/2021   Elevated alkaline phosphatase level 01/25/2023   Essential hypertension 08/25/2010   External hemorrhoids 02/05/2015   Facial trauma 01/22/2023   Family history of adverse reaction to anesthesia    Family history of malignant hyperthermia    father had this   Fibrocystic breast disease  10/18/2010   Fibromyalgia    Frequency of urination    Generalized anxiety disorder 08/03/2010   GERD (gastroesophageal reflux disease)    Headache 10/13/2013   High serum vitamin D  05/18/2021   History of chronic bronchitis    History of colonic polyp 02/25/2019   History of hiatal hernia    History of tachycardia    CONTROLLED  WITH ATENOLOL    Hyperglycemia 03/08/2021   Hyperkalemia 09/28/2022   Hyperlipidemia    Intertrigo 12/13/2022   Kidney stone 02/26/2019   Knee pain, right 04/30/2015   Left hip pain 04/30/2015   Leg pain 02/24/2011   Qualifier: Diagnosis of   By: Antonio DOJamee         Leukocytosis 07/05/2022   Local reaction to hymenoptera sting 05/09/2023   Low back pain 09/08/2020   Major depressive disorder 02/05/2013   Malignant neoplasm of lower-outer quadrant of right breast of female, estrogen receptor positive 01/03/2013   Nasal turbinate hypertrophy 05/12/2016   Oral herpes 02/05/2023   OSA and COPD overlap syndrome 06/10/2020   Osteoporosis 01/2019   T score -2.2 stable/improved from prior study   Pelvic pain    Peripheral neuropathy 05/22/2014   Personal history of radiation therapy    Pneumonia    Primary insomnia 06/25/2017  Pulmonary nodules 02/12/2015   Rash and other nonspecific skin eruption 05/09/2023   Recurrent falls 05/02/2020   Recurrent sinusitis 02/25/2019   Right arm pain 07/20/2016   RLS (restless legs syndrome) 11/09/2019   RUQ pain 07/05/2022   S/P radiation therapy 11/12/12 - 12/05/12   right Breast   Sciatica 04/30/2015   Sepsis 2014   from UTI    Sjogren's disease 12/12/2021   Splenic lesion 09/02/2012   Sprain of lateral ligament of ankle joint 07/31/2023   Stage 3 chronic kidney disease    Statin intolerance 02/29/2020   Stricture and stenosis of esophagus 10/19/2011   Tongue lesion 02/05/2023   Tremor 07/05/2022   Urgency of urination    Vertigo 01/22/2023   Vitamin B12 deficiency 03/08/2021   Vocal cord cyst  05/18/2021   Past Surgical History:  Procedure Laterality Date   24 HOUR PH STUDY N/A 04/03/2016   Procedure: 24 HOUR PH STUDY;  Surgeon: Elspeth Deward Naval, MD;  Location: [facility];  Service: Gastroenterology;  Laterality: N/A;   ANKLE ARTHROSCOPY WITH RECONSTRUCTION Left 03/12/2024   Procedure: ANKLE ARTHROSCOPY WITH OPEN LATERAL ANKLE LIGAMENT STABILIZATION;  Surgeon: Barton Drape, MD;  Location: [facility];  Service: Orthopedics;  Laterality: Left;   BREAST BIOPSY Right 08/23/2012   ADH   BREAST BIOPSY Right 10/11/2012   Ductal Carcinoma   BREAST EXCISIONAL BIOPSY Right 09/04/2013   benign   BREAST LUMPECTOMY Right 10/11/2012   W/ [facility] BX   CARDIAC CATHETERIZATION  09-13-2007  DR MORRIS   WELL-PRESERVED LVF/  DIFFUSE SCATTERED CORONARY CALCIFACATION AND ATHEROSCLEROSIS WITHOUT OBSTRUCTION   CARDIAC CATHETERIZATION  08-04-2010  DR MCALHANY   NON-OBSTRUCTIVE CAD/  pLAD 40%/  oLAD 30%/  mLAD 30%/  pRCA 30%/  EF 60%   CARDIOVASCULAR STRESS TEST  06-18-2012  DR McALHANY   LOW RISK NUCLEAR STUDY/  SMALL FIXED AREA OF MODERATELY DECREASED UPTAKE IN ANTEROSEPTAL WALL WHICH MAY BE ARTIFACTUAL/  NO ISCHEMIA/  EF 68%   COLONOSCOPY  09/29/2010   CYSTOSCOPY     CYSTOSCOPY WITH HYDRODISTENSION AND BIOPSY N/A 03/06/2014   Procedure: CYSTOSCOPY/HYDRODISTENSION/ INSTILATION OF MARCAINE  AND PYRIDIUM ;  Surgeon: Arlena LILLETTE Gal, MD;  Location: [facility];  Service: Urology;  Laterality: N/A;   CYSTOSCOPY/URETEROSCOPY/HOLMIUM LASER/STENT PLACEMENT Left 03/21/2023   Procedure: CYSTOSCOPY LEFT RETROGRADE PYELOGRAM, LEFT URETEROSCOPY, HOLMIUM LASER LITHOTRIPSYM, AND LEFT URETERAL STENT PLACEMENT;  Surgeon: Devere Lonni Righter, MD;  Location: [facility];  Service: Urology;  Laterality: Left;  60 MINUTES   ESOPHAGEAL MANOMETRY N/A 04/03/2016   Procedure: ESOPHAGEAL MANOMETRY (EM);  Surgeon: Elspeth Deward Naval, MD;  Location: [facility];  Service:  Gastroenterology;  Laterality: N/A;   EXTRACORPOREAL SHOCK WAVE LITHOTRIPSY Left 02/06/2019   Procedure: EXTRACORPOREAL SHOCK WAVE LITHOTRIPSY (ESWL);  Surgeon: Ottelin, Mark, MD;  Location: [facility];  Service: Urology;  Laterality: Left;   NASAL SINUS SURGERY  1985   ORIF RIGHT ANKLE  FX  2006   POLYPECTOMY     REMOVAL VOCAL CORD CYST  01/2013   REPAIR OF PERONEUS BREVIS TENDON Left 03/12/2024   Procedure: REPAIR OF PERONEUS TENDON;  Surgeon: Barton Drape, MD;  Location: [facility];  Service: Orthopedics;  Laterality: Left;   RIGHT BREAST BX  08/23/2012   RIGHT HAND SURGERY  X3  LAST ONE 2009   INCLUDES  ORIF RIGHT 5TH FINGER AND REVISION TWICE   SKIN CANCER EXCISION     face,arms   TONSILLECTOMY AND ADENOIDECTOMY  AGE 53  TOTAL ABDOMINAL HYSTERECTOMY W/ BILATERAL SALPINGOOPHORECTOMY  1982   W/  APPENDECTOMY   TRANSTHORACIC ECHOCARDIOGRAM  06/24/2012   GRADE I DIASTOLIC DYSFUNCTION/  EF 55-60%/  MILD MR   UPPER GASTROINTESTINAL ENDOSCOPY     Patient Active Problem List   Diagnosis Date Noted   Breast discharge 08/27/2024   Acute left ankle pain 08/14/2024   Pedal edema 05/20/2024   Otalgia of both ears 04/23/2024   Tinnitus of both ears 04/23/2024   IDA (iron deficiency anemia) 09/12/2023   Stage 3 chronic kidney disease    Right ankle pain 07/31/2023   Common bile duct dilatation 02/13/2023   Oral herpes 02/05/2023   Elevated alkaline phosphatase level 01/25/2023   Vertigo 01/22/2023   Intertrigo 12/13/2022   Anemia 09/28/2022   Tremor 07/05/2022   RUQ pain 07/05/2022   Leukocytosis 07/05/2022   Sjogren's disease 12/12/2021   Vocal cord cyst 05/18/2021   Vitamin D  deficiency 05/18/2021   Dysuria 03/09/2021   Vitamin B12 deficiency 03/08/2021   Hyperglycemia 03/08/2021   OSA and COPD overlap syndrome 06/10/2020   Recurrent falls 05/02/2020   Statin intolerance 02/29/2020   RLS (restless legs syndrome) 11/09/2019   Recurrent sinusitis 02/25/2019    History of colonic polyp 02/25/2019   DDD (degenerative disc disease), cervical 09/17/2018   Degenerative scoliosis 04/22/2018   Primary insomnia 06/25/2017   Chronic interstitial cystitis 02/01/2017   Deviated nasal septum 05/12/2016   Hypertrophy of nasal turbinates 05/12/2016   Dysphagia    Allergic rhinitis 01/25/2016   Dyspnea    Sciatica 04/30/2015   Bilateral carpal tunnel syndrome 03/04/2015   Hyperlipidemia 03/01/2015   Pulmonary nodules 02/12/2015   External hemorrhoids 02/05/2015   Chronic night sweats 01/08/2015   Peripheral neuropathy 05/22/2014   Headache 10/13/2013   Arthralgia 08/18/2013   ANA positive 07/29/2013   Depression with anxiety 02/05/2013   Malignant neoplasm of lower-outer quadrant of right breast of female, estrogen receptor positive 01/03/2013   Asthma, allergic 08/05/2012   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   GERD (gastroesophageal reflux disease) 06/03/2012   Stricture and stenosis of esophagus 10/19/2011   Constipation 07/21/2011   Osteoporosis 04/17/2011   Fibrocystic breast disease 10/18/2010   Essential hypertension 08/25/2010   CAD in native artery 08/25/2010   Generalized anxiety disorder 08/03/2010   Bronchitis 03/10/2010   Fibromyalgia 01/11/2010   PCP: Domenica Harlene LABOR, MD  REFERRING PROVIDER:   Barton Drape, MD (peroneal tendinitis)  Dina Camie BRAVO, PA-C  (Unstable gait)   Bonner Ade, MD (radiculopathy)    Mikel Leeroy Jenkins HERO, MD  (Pain in right elbow, pain in left elbow)   REFERRING DIAG:  M76.72 (ICD-10-CM) - Peroneal tendinitis, left leg   M54.16 (ICD-10-CM) - Radiculopathy, lumbar region    M25.521 (ICD-10-CM) - Pain in right elbow  M25.522 (ICD-10-CM) - Pain in left elbow   R26.81 (ICD-10-CM) - Unstable gait   THERAPY DIAG:  Muscle weakness (generalized)  Unsteadiness on feet  Other abnormalities of gait and mobility  Radiculopathy, lumbar region  Pain of both elbows  Pain in left ankle and  joints of left foot  Rationale for Evaluation and Treatment: Rehabilitation  ONSET DATE: 03/12/24  SUBJECTIVE:  SUBJECTIVE STATEMENT: Patient reports undergoing lumbar injections a week ago and has been feeling good since these injections. Continues to complete vestibular exercises for her dizziness that continues to fluctuate. The left ankle has not stopped hurting and has f/u with surgeon today, but feels that she can tolerate this pain. Continues  to have kidney issues and the doctor believes it could still be a stone, but has not had further work up in the past few weeks. She has f/u with Dr. Bonner on Friday for her back. She has new referral for bilateral elbow pain. She reports at least 18 months of bilateral elbow pain of insidious onset, but this has been on the back burner due to other issues. Overall the pain has been worsening in the elbows since onset. The elbows will stay swollen, are tender touch, and working in the kitchen (peeling,slicing) and carrying items will bother the elbows.   PERTINENT HISTORY: S/p Lt ankle arthroscopy Brostrom stabilization, peroneus brevis and longus debridement and tenosynovectomy 03/12/24 See extensive PMH above   PAIN:  Are you having pain? Yes: NPRS scale:  3 currently/10 Pain location:  low/mid back Pain description: pressure Aggravating factors: walking, standing,sitting Relieving factors: heat, lying flat   Are you having pain? Yes: NPRS scale: 4 Pain location: lateral aspect of left foot Pain description: sore Aggravating factors: pressure, WB Relieving factors: unsure   Are you having pain? Yes: NPRS scale: 6 Pain location: bilateral elbows  Pain description: hot, inflammed Aggravating factors: carrying, kitchen tasks, sensitive to touch Relieving factors: nothing  PRECAUTIONS: Fall,progress per protocol; osteoporosis   WEIGHT BEARING RESTRICTIONS: No  FALLS:  Has patient fallen in last 6 months? Yes. Number of falls 2 (fell  stepping up grassy hill, fell in her kitchen from turning quickly)  LIVING ENVIRONMENT: Lives with: lives with their spouse Lives in: House/apartment Stairs: Yes: Internal: 16 steps; on left going up Has following equipment at home: Single point cane  PATIENT GOALS: I want to be able to walk again.   OBJECTIVE:  Note: Objective measures were completed at Evaluation unless otherwise noted.  DIAGNOSTIC FINDINGS: none on file   PATIENT SURVEYS:  FADI: 46/104; 44% 06/26/24: FADI: 67/100; 67%  08/11/24: modified oswestry 30/50; 60% disability   10/29/24: 48% disability modified oswestry   COGNITION: Overall cognitive status: Within functional limits for tasks assessed     SENSATION: See palpation  EDEMA:  Mild swelling about Lt ankle   PALPATION: Hypersensitivity to palpation about distal fibula, lateral malleolus   LUMBAR ROM:  Active  A/PROM  eval  Flexion 50% limited LBP and RLE pain   Extension WNL LBP  Right lateral flexion 25% limited  Left lateral flexion 25% limited   Right rotation WNL  Left rotation WNL   (Blank rows = not tested)  LOWER EXTREMITY ROM: Active ROM Right eval Left eval 05/05/24 Left  05/20/24 Left  06/03/24 Left   Hip flexion       Hip extension       Hip abduction       Hip adduction       Hip internal rotation       Hip external rotation       Knee flexion       Knee extension       Ankle dorsiflexion  6 9  10   Ankle plantarflexion  50  65 67  Ankle inversion  15  15 17   Ankle eversion  8  8 9    (Blank rows = not tested)  LOWER EXTREMITY MMT: MMT Right eval Left eval 06/26/24 Left  08/04/24 Left  08/11/24   Hip flexion     Rt: 4; Lt: 4-  Hip extension       Hip abduction     4 bilateral   Hip adduction  Hip internal rotation       Hip external rotation       Knee flexion       Knee extension       Ankle dorsiflexion  3+ 4+ 4+   Ankle plantarflexion  3+ sitting 4- in sitting 4 in sitting    Ankle inversion  3+ 4- 4    Ankle eversion  3+ 4 5    (Blank rows = not tested) UPPER EXTREMITY MMT:  MMT Right 10/29/24 Left 10/29/24  Shoulder flexion    Shoulder extension    Shoulder abduction    Shoulder adduction    Shoulder extension    Shoulder internal rotation    Shoulder external rotation    Middle trapezius    Lower trapezius    Elbow flexion 4+ 4+  Elbow extension 5 4  Wrist flexion 5 5  Wrist extension 4- pain  4+  Wrist ulnar deviation    Wrist radial deviation    Wrist pronation 5 5  Wrist supination 4- 4-  Grip strength     (Blank rows = not tested)  FUNCTIONAL TESTS:  TUG: 13.5 seconds Romberg x 30 seconds Semi-tandem unable   06/09/24: TUG: 11.1 seconds   06/26/24: tandem 1 second   08/11/24: Rt rolling requires use of UE support on the hip to roll, increased pain   GAIT: Distance walked: 20 ft  Assistive device utilized: Single point cane. Ankle brace Level of assistance: Modified independence Comments: limited push-off LLE, foot ER,  [facility] Adult PT Treatment:                                                DATE: 10/29/24 Therapeutic Exercise: Reviewed and updated HEP discussing frequency, sets, reps.   Self Care: Pain neuroscience education and benefits of mindfulness/meditation Modalities for pain control including TENS, heat, etc.    [facility] Adult PT Treatment:                                                DATE: 10/08/24 Therapeutic Activity: Sit<>stand x 5 reps-- patient demonstrates without UE use Standing heel raises Standing toe raises Standing gastroc stretch *Performed these activities as part of general strength and mobility plan Discussed doing daily movement and then adding in VOR (for dizziness), foot towel scrunches (for foot pain), lumbar stabilization (for back pain) if those specific symptoms arise We discussed plan to hold therapy for 2 weeks while adjusting meds and recovering from recent trip-- then we will check HEP and progress difficulty if  needed Discussed referral for elbows-- see assessment BP=104/65 Gait: Patient unsteady upon rising today and is mod indep with SPC and CGA without device for short distances in the clinic   [facility] Adult PT Treatment:                                                DATE: 09/24/24 Therapeutic Exercise: Back extensions attempted however pt could not complete  Neuromuscular re-ed: Adductor squeeze with ball between knees x20 Ball press squeeze for TA activation in seated x8  Self  Care: Educated on use of TENS and how it can be used for self pain management at home Recommended  urology follow up    [facility] Adult PT Treatment:                                                DATE: 09/22/24 PHYSICAL THERAPY RE-EVALUATION: Modified disability = 54% (was 60%) Palpation of L lateral ankle with pain that shoots along border of foot 5 time sit<>stand=22.37 seconds today Pain is present in low back during sit<>supine Discussed daily activities around her home and what is limited== patient notes feeling greater unsteadiness today-- this can vary; she also has intermittent L foot pain and low back pain.  Therapeutic Exercise Toe extension alternating big toe and other 4 digits (toe yoga) Used a ball under the ball of her foot to add toe spreading as toe yoga activities-- unable today Neuromuscular re-ed: Tandem=1 second R and L-- difficulty dec'ing UE support today Standing narrow base of support is challenging today with patient reaching for hand held support Rolling R<>L without dizziness    [facility] Adult PT Treatment:                                                DATE: 09/17/24 Therapeutic Exercise: Standing lumbar extension 2 x 10  Reviewed HEP- discussing centralization of pain as it relates to repeated movement  Therapeutic Activity: Hip hinge at wall with foam roller x 10   Self Care: Discussed use of TENS unit for pain modulation with recommendation to bring in her TENS unit at next visit  to discuss proper setup and application.  Reviewed bending/lifting mechanics as part of previously provided handout   PATIENT EDUCATION:  Education details:see treatment  Person educated: Patient Education method: Explanation, demo, cues, handout Education comprehension: verbalized understanding, returned demo, cues   HOME EXERCISE PROGRAM: Access Code: [email or web address] URL: [email or web address] Date: 10/29/2024 Prepared by: Lucie Meeter  Exercises - Sit to Stand  - 1 x daily - 3 x weekly - 1 sets - 10 reps - Toe Raises with Counter Support  - 1 x daily - 3 x weekly - 2 sets - 10 reps - Heel Raises with Counter Support  - 1 x daily - 3 x weekly - 2 sets - 10 reps - Standing Hip Abduction with Counter Support  - 1 x daily - 3 x weekly - 2 sets - 10 reps - Gastroc Stretch on Wall  - 1 x daily - 3 x weekly - 3 sets - 30 sec  hold - Towel Scrunches  - 1 x daily - 3 x weekly - 1 sets - 5 reps - Standing Elbow Extension with Self-Anchored Resistance  - 1 x daily - 3 x weekly - 2 sets - 10 reps - Standing Single Arm Elbow Flexion with Resistance  - 1 x daily - 3 x weekly - 2 sets - 10 reps - Standing Gaze Stabilization with Head Rotation  - 1 x daily - 7 x weekly - 2 sets - 10 reps - 5 Minute Guided Meditation  - 7 x weekly - 10 Minute Guided Meditation  - 7 x weekly  Patient Education - Treating Persistent Pain Without Medications: Mindfulness  ASSESSMENT:  CLINICAL IMPRESSION: Alexandre presents with new referral to evaluate and treat bilateral elbow pain. She reports chronic, worsening elbow pain over the past 18 months of insidious onset. She reports difficulty with carrying and fine motor tasks secondary to pain. Re-evaluation was performed including elbow assessment and re-assessment of other current treatment diagnoses including low back pain, Lt ankle arthroscopy, and unstable gait. Upon assessment she is noted to have bilateral elbow and wrist weakness with elbow pain  provoked with wrist extension MMT. She has met all established goals as they relate to the ankle and dizziness. She is making steady progress towards goals specific for her low back pain. She will benefit from continued skilled PT 1-2 weekly for an additional 6 weeks to progress her functional strength and mobility working towards developing a comprehensive home program that she can continue independently to assist in management of her chronic conditions in order to optimize her function and assist in overall pain reduction.   EVAL: Patient is a 78 y.o. female who was seen today for physical therapy evaluation and treatment for s/p Lt ankle arthroscopy Brostrom stabilization, peroneus brevis and longus debridement and tenosynovectomy on 03/12/24. She demonstrates ROM, strength, gait and balance deficits that are consistent with her recent post-operative status. She will benefit from skilled PT to address the above stated deficits in order to return to optimal function.   OBJECTIVE IMPAIRMENTS: Abnormal gait, decreased activity tolerance, decreased balance, decreased endurance, decreased knowledge of condition, decreased mobility, difficulty walking, decreased ROM, decreased strength, increased edema, impaired flexibility, impaired sensation, improper body mechanics, postural dysfunction, and pain.   GOALS: Goals reviewed with patient? Yes SHORT TERM GOALS: Target date: 11/19/2024 (updated 10/29/24)   Patient will demonstrate use of TENS unit for home to manage chronic low back pain. Baseline: to bring next visit 10/29/24: independent use of TENS unit as needed for pain  Goal status: MET  2.  Patient will improve 5 time sit<>stand to < or equal to 18 seconds.  Baseline: 22.53 seconds 10/29/24: 19.9 seconds  Goal status: PROGRESSING  LONG TERM GOALS: Target date: 12/13/24 (updated 10/29/24)   Patient will score >/= 60/104 on the Foot and Ankle Disability Index (MDC 7) to signify clinically meaningful  improvement in functional abilities.  Baseline: see above Goal status: MET  2.  Patient will maintain tandem stance for at least 10 seconds to improve gait stability.  Baseline: see above 06/26/24: tandem 1 second 08/04/24: tandem 4 second 10/29/24: tandem 12 seconds  Goal status: MET   3.  Patient will be modified independent with curb/stair negotiation.  Baseline: uses cane and requires support from spouse  06/26/24: requires cane  Goal status: MET  4.  Patient will self-report ability to tolerate at least 30 minutes of standing/walking activity.  Baseline: 10 minutes  06/26/24: 20 minutes  08/04/24: limited due to back pain and dizziness, 10 minutes  10/29/24: 20-30 minutes  Goal status: MET  5.  Patient will demonstrate at least 4/5 Lt ankle strength to improve stability when navigating on uneven terrain.  Baseline: see above  Goal status: MET  6.  Patient will be independent with advanced home program to progress/maintain current level of function.  Baseline: initial HEP issued  Goal status: MET   7. Patient will tolerate rolling R and L without d/o dizziness.   Baseline: 4/10 dizzines 08/04/24: 4/10 dizziness baseline    Goal status: MET 09/22/24: baseline dizziness today is 1/10  (began again this weekend) Rolling is so minute  I don't think it's dizziness   8. Patient will tolerate gaze x 1 viewing in sitting x 20 seconds to work towards improved tolerance to turns in standing.   Baseline: Unable to do 2 reps in sitting   08/04/24: VOR x 1, 20 seconds    Goal status: MET   9. Patient will score </= 40 % disability on the Oswestry Disability Index (MCID is 20%) to signify clinically meaningful improvement in functional abilities.    Baseline: see above   Goal status: PROGRESSING   10. Patient will demonstrate proper lifting mechanics to improve her tolerance to loading/unloading the dishwasher.    Baseline: increased pain, limits bending activity   10/29/24: continues to not  lift at home due to pain.    Goal status: PROGRESSING  11. Patient will be indep with final HEP progression.  Baseline: has HEP will need progression Goal status: PROGRESSING   12. Patient will demonstrate at least 4+/5 bilateral elbow and wrist strength to improve ability to carry items.  Baseline: see above  Goal Status: NEW    PLAN:  PT FREQUENCY: 1-2x/week  PT DURATION: 6 weeks  PLANNED INTERVENTIONS: 97164- PT Re-evaluation, 97750- Physical Performance Testing, 97110-Therapeutic exercises, 97530- Therapeutic activity, [id number]- Neuromuscular re-education, 97535- Self Care, [id number]- Manual therapy, [id number]- Gait training, [phone]- Aquatic Therapy, [phone]- Vasopneumatic device, [phone] (1-2 muscles), [id number] (3+ muscles)- Dry Needling, Taping, Cryotherapy, and Moist heat  PLAN FOR NEXT SESSION: review and progress HEP; focus on generalized strengthening and working towards independent home program. Architectural technologist, functional strengthening and balance activity   Lucie Meeter, PT, DPT, ATC 10/29/24 12:58 PM

## 2024-10-30 ENCOUNTER — Telehealth (HOSPITAL_BASED_OUTPATIENT_CLINIC_OR_DEPARTMENT_OTHER): Payer: Self-pay | Admitting: *Deleted

## 2024-10-30 ENCOUNTER — Ambulatory Visit: Payer: Self-pay

## 2024-10-30 NOTE — Telephone Encounter (Signed)
   Pre-operative Risk Assessment    Patient Name: [name]  DOB: 1946-08-03 MRN: [id number]   Date of last office visit: 07/23/24 DR. CRENSHAW Date of next office visit: NONE   Request for Surgical Clearance    Procedure:  RIGHT CARPAL TUNNEL RELEASE   Date of Surgery:  Clearance TBD                                Surgeon:  DR. CHARLES BENFIELD Surgeon's [facility] or Practice Name:  [facility] Phone number:  [phone]  Fax number:  [phone] SHERR[facility] WILLS   Type of Clearance Requested:   - Medical  - Pharmacy:  Hold Aspirin      Type of Anesthesia:  CHOICE   Additional requests/questions:    Bonney Niels Jest   10/30/2024, 11:19 AM

## 2024-10-30 NOTE — Telephone Encounter (Signed)
 1st attempt to reach pt regarding surgical clearance and the need for an TELE appointment.  Left pt a detailed message to call back and get that scheduled.

## 2024-10-30 NOTE — Therapy (Incomplete)
 OUTPATIENT PHYSICAL THERAPY TREATMENT     Patient Name: [name] MRN: [id number] DOB:11/12/1946, 78 y.o., female Today's Date: 10/30/2024  END OF SESSION:      Past Medical History:  Diagnosis Date   Abdominal pain 10/26/2013   Allergic rhinitis 01/25/2016   Allergy     ANA positive 07/29/2013   Anemia    Arthralgia 08/18/2013   Arthritis    Asthma, allergic 08/05/2012   Atrial flutter    past history- not current   Atypical chest pain 01/01/2015   Bilateral carpal tunnel syndrome 03/04/2015   CAD in native artery 08/25/2010   Minor CAD 2009, 2011, low risk Myoview  2013 and 2016        Cataract    bilaterally removed    Cellulitis of breast 02/05/2023   Chronic constipation    Chronic cough 01/09/2017   Chronic interstitial cystitis 02/01/2017   Chronic night sweats 01/08/2015   Common bile duct dilatation 02/13/2023   Concussion    x 3   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   DDD (degenerative disc disease), cervical 09/17/2018   Degenerative scoliosis 04/22/2018   Deviated septum 05/12/2016   Dysphagia    Dyspnea    Dysuria 03/09/2021   Elevated alkaline phosphatase level 01/25/2023   Essential hypertension 08/25/2010   External hemorrhoids 02/05/2015   Facial trauma 01/22/2023   Family history of adverse reaction to anesthesia    Family history of malignant hyperthermia    father had this   Fibrocystic breast disease 10/18/2010   Fibromyalgia    Frequency of urination    Generalized anxiety disorder 08/03/2010   GERD (gastroesophageal reflux disease)    Headache 10/13/2013   High serum vitamin D  05/18/2021   History of chronic bronchitis    History of colonic polyp 02/25/2019   History of hiatal hernia    History of tachycardia    CONTROLLED  WITH ATENOLOL    Hyperglycemia 03/08/2021   Hyperkalemia 09/28/2022   Hyperlipidemia    Intertrigo 12/13/2022   Kidney stone 02/26/2019   Knee pain, right 04/30/2015   Left hip pain 04/30/2015   Leg  pain 02/24/2011   Qualifier: Diagnosis of   By: Antonio DOJamee         Leukocytosis 07/05/2022   Local reaction to hymenoptera sting 05/09/2023   Low back pain 09/08/2020   Major depressive disorder 02/05/2013   Malignant neoplasm of lower-outer quadrant of right breast of female, estrogen receptor positive 01/03/2013   Nasal turbinate hypertrophy 05/12/2016   Oral herpes 02/05/2023   OSA and COPD overlap syndrome 06/10/2020   Osteoporosis 01/2019   T score -2.2 stable/improved from prior study   Pelvic pain    Peripheral neuropathy 05/22/2014   Personal history of radiation therapy    Pneumonia    Primary insomnia 06/25/2017   Pulmonary nodules 02/12/2015   Rash and other nonspecific skin eruption 05/09/2023   Recurrent falls 05/02/2020   Recurrent sinusitis 02/25/2019   Right arm pain 07/20/2016   RLS (restless legs syndrome) 11/09/2019   RUQ pain 07/05/2022   S/P radiation therapy 11/12/12 - 12/05/12   right Breast   Sciatica 04/30/2015   Sepsis 2014   from UTI    Sjogren's disease 12/12/2021   Splenic lesion 09/02/2012   Sprain of lateral ligament of ankle joint 07/31/2023   Stage 3 chronic kidney disease    Statin intolerance 02/29/2020   Stricture and stenosis of esophagus 10/19/2011   Tongue lesion 02/05/2023   Tremor 07/05/2022  Urgency of urination    Vertigo 01/22/2023   Vitamin B12 deficiency 03/08/2021   Vocal cord cyst 05/18/2021   Past Surgical History:  Procedure Laterality Date   24 HOUR PH STUDY N/A 04/03/2016   Procedure: 24 HOUR PH STUDY;  Surgeon: Elspeth Deward Naval, MD;  Location: [facility];  Service: Gastroenterology;  Laterality: N/A;   ANKLE ARTHROSCOPY WITH RECONSTRUCTION Left 03/12/2024   Procedure: ANKLE ARTHROSCOPY WITH OPEN LATERAL ANKLE LIGAMENT STABILIZATION;  Surgeon: Barton Drape, MD;  Location: [facility];  Service: Orthopedics;  Laterality: Left;   BREAST BIOPSY Right 08/23/2012   ADH   BREAST BIOPSY  Right 10/11/2012   Ductal Carcinoma   BREAST EXCISIONAL BIOPSY Right 09/04/2013   benign   BREAST LUMPECTOMY Right 10/11/2012   W/ SLN BX   CARDIAC CATHETERIZATION  09-13-2007  DR MORRIS   WELL-PRESERVED LVF/  DIFFUSE SCATTERED CORONARY CALCIFACATION AND ATHEROSCLEROSIS WITHOUT OBSTRUCTION   CARDIAC CATHETERIZATION  08-04-2010  DR MCALHANY   NON-OBSTRUCTIVE CAD/  pLAD 40%/  oLAD 30%/  mLAD 30%/  pRCA 30%/  EF 60%   CARDIOVASCULAR STRESS TEST  06-18-2012  DR McALHANY   LOW RISK NUCLEAR STUDY/  SMALL FIXED AREA OF MODERATELY DECREASED UPTAKE IN ANTEROSEPTAL WALL WHICH MAY BE ARTIFACTUAL/  NO ISCHEMIA/  EF 68%   COLONOSCOPY  09/29/2010   CYSTOSCOPY     CYSTOSCOPY WITH HYDRODISTENSION AND BIOPSY N/A 03/06/2014   Procedure: CYSTOSCOPY/HYDRODISTENSION/ INSTILATION OF MARCAINE  AND PYRIDIUM ;  Surgeon: Arlena LILLETTE Gal, MD;  Location: [facility];  Service: Urology;  Laterality: N/A;   CYSTOSCOPY/URETEROSCOPY/HOLMIUM LASER/STENT PLACEMENT Left 03/21/2023   Procedure: CYSTOSCOPY LEFT RETROGRADE PYELOGRAM, LEFT URETEROSCOPY, HOLMIUM LASER LITHOTRIPSYM, AND LEFT URETERAL STENT PLACEMENT;  Surgeon: Devere Lonni Righter, MD;  Location: [facility];  Service: Urology;  Laterality: Left;  60 MINUTES   ESOPHAGEAL MANOMETRY N/A 04/03/2016   Procedure: ESOPHAGEAL MANOMETRY (EM);  Surgeon: Elspeth Deward Naval, MD;  Location: [facility];  Service: Gastroenterology;  Laterality: N/A;   EXTRACORPOREAL SHOCK WAVE LITHOTRIPSY Left 02/06/2019   Procedure: EXTRACORPOREAL SHOCK WAVE LITHOTRIPSY (ESWL);  Surgeon: Ottelin, Mark, MD;  Location: [facility];  Service: Urology;  Laterality: Left;   NASAL SINUS SURGERY  1985   ORIF RIGHT ANKLE  FX  2006   POLYPECTOMY     REMOVAL VOCAL CORD CYST  01/2013   REPAIR OF PERONEUS BREVIS TENDON Left 03/12/2024   Procedure: REPAIR OF PERONEUS TENDON;  Surgeon: Barton Drape, MD;  Location:  SUR[facility]ERY [facility];  Service: Orthopedics;  Laterality: Left;    RIGHT BREAST BX  08/23/2012   RIGHT HAND SURGERY  X3  LAST ONE 2009   INCLUDES  ORIF RIGHT 5TH FINGER AND REVISION TWICE   SKIN CANCER EXCISION     face,arms   TONSILLECTOMY AND ADENOIDECTOMY  AGE 80   TOTAL ABDOMINAL HYSTERECTOMY W/ BILATERAL SALPINGOOPHORECTOMY  1982   W/  APPENDECTOMY   TRANSTHORACIC ECHOCARDIOGRAM  06/24/2012   GRADE I DIASTOLIC DYSFUNCTION/  EF 55-60%/  MILD MR   UPPER GASTROINTESTINAL ENDOSCOPY     Patient Active Problem List   Diagnosis Date Noted   Breast discharge 08/27/2024   Acute left ankle pain 08/14/2024   Pedal edema 05/20/2024   Otalgia of both ears 04/23/2024   Tinnitus of both ears 04/23/2024   IDA (iron deficiency anemia) 09/12/2023   Stage 3 chronic kidney disease    Right ankle pain 07/31/2023   Common bile duct dilatation 02/13/2023   Oral herpes 02/05/2023   Elevated  alkaline phosphatase level 01/25/2023   Vertigo 01/22/2023   Intertrigo 12/13/2022   Anemia 09/28/2022   Tremor 07/05/2022   RUQ pain 07/05/2022   Leukocytosis 07/05/2022   Sjogren's disease 12/12/2021   Vocal cord cyst 05/18/2021   Vitamin D  deficiency 05/18/2021   Dysuria 03/09/2021   Vitamin B12 deficiency 03/08/2021   Hyperglycemia 03/08/2021   OSA and COPD overlap syndrome 06/10/2020   Recurrent falls 05/02/2020   Statin intolerance 02/29/2020   RLS (restless legs syndrome) 11/09/2019   Recurrent sinusitis 02/25/2019   History of colonic polyp 02/25/2019   DDD (degenerative disc disease), cervical 09/17/2018   Degenerative scoliosis 04/22/2018   Primary insomnia 06/25/2017   Chronic interstitial cystitis 02/01/2017   Deviated nasal septum 05/12/2016   Hypertrophy of nasal turbinates 05/12/2016   Dysphagia    Allergic rhinitis 01/25/2016   Dyspnea    Sciatica 04/30/2015   Bilateral carpal tunnel syndrome 03/04/2015   Hyperlipidemia 03/01/2015   Pulmonary nodules 02/12/2015   External hemorrhoids 02/05/2015   Chronic night sweats 01/08/2015    Peripheral neuropathy 05/22/2014   Headache 10/13/2013   Arthralgia 08/18/2013   ANA positive 07/29/2013   Depression with anxiety 02/05/2013   Malignant neoplasm of lower-outer quadrant of right breast of female, estrogen receptor positive 01/03/2013   Asthma, allergic 08/05/2012   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   GERD (gastroesophageal reflux disease) 06/03/2012   Stricture and stenosis of esophagus 10/19/2011   Constipation 07/21/2011   Osteoporosis 04/17/2011   Fibrocystic breast disease 10/18/2010   Essential hypertension 08/25/2010   CAD in native artery 08/25/2010   Generalized anxiety disorder 08/03/2010   Bronchitis 03/10/2010   Fibromyalgia 01/11/2010   PCP: Domenica Harlene LABOR, MD  REFERRING PROVIDER:   Barton Drape, MD (peroneal tendinitis)  Dina Camie BRAVO, PA-C  (Unstable gait)   Bonner Ade, MD (radiculopathy)    Mikel Leeroy Jenkins HERO, MD  (Pain in right elbow, pain in left elbow)   REFERRING DIAG:  M76.72 (ICD-10-CM) - Peroneal tendinitis, left leg   M54.16 (ICD-10-CM) - Radiculopathy, lumbar region    M25.521 (ICD-10-CM) - Pain in right elbow  M25.522 (ICD-10-CM) - Pain in left elbow   R26.81 (ICD-10-CM) - Unstable gait   THERAPY DIAG:  No diagnosis found.  Rationale for Evaluation and Treatment: Rehabilitation  ONSET DATE: 03/12/24  SUBJECTIVE:  SUBJECTIVE STATEMENT: Patient reports undergoing lumbar injections a week ago and has been feeling good since these injections. Continues to complete vestibular exercises for her dizziness that continues to fluctuate. The left ankle has not stopped hurting and has f/u with surgeon today, but feels that she can tolerate this pain. Continues to have kidney issues and the doctor believes it could still be a stone, but has not had further work up in the past few weeks. She has f/u with Dr. Bonner on Friday for her back. She has new referral for bilateral elbow pain. She reports at least 18 months of  bilateral elbow pain of insidious onset, but this has been on the back burner due to other issues. Overall the pain has been worsening in the elbows since onset. The elbows will stay swollen, are tender touch, and working in the kitchen (peeling,slicing) and carrying items will bother the elbows.   PERTINENT HISTORY: S/p Lt ankle arthroscopy Brostrom stabilization, peroneus brevis and longus debridement and tenosynovectomy 03/12/24 See extensive PMH above   PAIN:  Are you having pain? Yes: NPRS scale:  3 currently/10 Pain location:  low/mid back Pain description: pressure  Aggravating factors: walking, standing,sitting Relieving factors: heat, lying flat   Are you having pain? Yes: NPRS scale: 4 Pain location: lateral aspect of left foot Pain description: sore Aggravating factors: pressure, WB Relieving factors: unsure   Are you having pain? Yes: NPRS scale: 6 Pain location: bilateral elbows  Pain description: hot, inflammed Aggravating factors: carrying, kitchen tasks, sensitive to touch Relieving factors: nothing  PRECAUTIONS: Fall,progress per protocol; osteoporosis   WEIGHT BEARING RESTRICTIONS: No  FALLS:  Has patient fallen in last 6 months? Yes. Number of falls 2 (fell stepping up grassy hill, fell in her kitchen from turning quickly)  LIVING ENVIRONMENT: Lives with: lives with their spouse Lives in: House/apartment Stairs: Yes: Internal: 16 steps; on left going up Has following equipment at home: Single point cane  PATIENT GOALS: I want to be able to walk again.   OBJECTIVE:  Note: Objective measures were completed at Evaluation unless otherwise noted.  DIAGNOSTIC FINDINGS: none on file   PATIENT SURVEYS:  FADI: 46/104; 44% 06/26/24: FADI: 67/100; 67%  08/11/24: modified oswestry 30/50; 60% disability   10/29/24: 48% disability modified oswestry   COGNITION: Overall cognitive status: Within functional limits for tasks assessed     SENSATION: See  palpation  EDEMA:  Mild swelling about Lt ankle   PALPATION: Hypersensitivity to palpation about distal fibula, lateral malleolus   LUMBAR ROM:  Active  A/PROM  eval  Flexion 50% limited LBP and RLE pain   Extension WNL LBP  Right lateral flexion 25% limited  Left lateral flexion 25% limited   Right rotation WNL  Left rotation WNL   (Blank rows = not tested)  LOWER EXTREMITY ROM: Active ROM Right eval Left eval 05/05/24 Left  05/20/24 Left  06/03/24 Left   Hip flexion       Hip extension       Hip abduction       Hip adduction       Hip internal rotation       Hip external rotation       Knee flexion       Knee extension       Ankle dorsiflexion  6 9  10   Ankle plantarflexion  50  65 67  Ankle inversion  15  15 17   Ankle eversion  8  8 9    (Blank rows = not tested)  LOWER EXTREMITY MMT: MMT Right eval Left eval 06/26/24 Left  08/04/24 Left  08/11/24   Hip flexion     Rt: 4; Lt: 4-  Hip extension       Hip abduction     4 bilateral   Hip adduction       Hip internal rotation       Hip external rotation       Knee flexion       Knee extension       Ankle dorsiflexion  3+ 4+ 4+   Ankle plantarflexion  3+ sitting 4- in sitting 4 in sitting    Ankle inversion  3+ 4- 4   Ankle eversion  3+ 4 5    (Blank rows = not tested) UPPER EXTREMITY MMT:  MMT Right 10/29/24 Left 10/29/24  Shoulder flexion    Shoulder extension    Shoulder abduction    Shoulder adduction    Shoulder extension    Shoulder internal rotation    Shoulder external rotation    Middle trapezius    Lower trapezius    Elbow flexion 4+ 4+  Elbow extension 5 4  Wrist flexion 5 5  Wrist extension 4- pain  4+  Wrist ulnar deviation    Wrist radial deviation    Wrist pronation 5 5  Wrist supination 4- 4-  Grip strength     (Blank rows = not tested)  FUNCTIONAL TESTS:  TUG: 13.5 seconds Romberg x 30 seconds Semi-tandem unable   06/09/24: TUG: 11.1 seconds   06/26/24: tandem 1 second    08/11/24: Rt rolling requires use of UE support on the hip to roll, increased pain   GAIT: Distance walked: 20 ft  Assistive device utilized: Single point cane. Ankle brace Level of assistance: Modified independence Comments: limited push-off LLE, foot ER,  [facility] Adult PT Treatment:                                                DATE: 10/29/24 Therapeutic Exercise: Reviewed and updated HEP discussing frequency, sets, reps.   Self Care: Pain neuroscience education and benefits of mindfulness/meditation Modalities for pain control including TENS, heat, etc.    [facility] Adult PT Treatment:                                                DATE: 10/08/24 Therapeutic Activity: Sit<>stand x 5 reps-- patient demonstrates without UE use Standing heel raises Standing toe raises Standing gastroc stretch *Performed these activities as part of general strength and mobility plan Discussed doing daily movement and then adding in VOR (for dizziness), foot towel scrunches (for foot pain), lumbar stabilization (for back pain) if those specific symptoms arise We discussed plan to hold therapy for 2 weeks while adjusting meds and recovering from recent trip-- then we will check HEP and progress difficulty if needed Discussed referral for elbows-- see assessment BP=104/65 Gait: Patient unsteady upon rising today and is mod indep with SPC and CGA without device for short distances in the clinic   [facility] Adult PT Treatment:                                                DATE: 09/24/24 Therapeutic Exercise: Back extensions attempted however pt could not complete  Neuromuscular re-ed: Adductor squeeze with ball between knees x20 Ball press squeeze for TA activation in seated x8  Self Care: Educated on use of TENS and how it can be used for self pain management at home Recommended  urology follow up    [facility] Adult PT Treatment:                                                DATE: 09/22/24 PHYSICAL THERAPY  RE-EVALUATION: Modified disability = 54% (was 60%) Palpation of L lateral ankle with pain that shoots along border of foot 5 time sit<>stand=22.37 seconds today Pain is present in low back during sit<>supine Discussed daily activities around her home and what is limited== patient notes feeling greater unsteadiness today-- this can vary; she also has intermittent L foot pain  and low back pain.  Therapeutic Exercise Toe extension alternating big toe and other 4 digits (toe yoga) Used a ball under the ball of her foot to add toe spreading as toe yoga activities-- unable today Neuromuscular re-ed: Tandem=1 second R and L-- difficulty dec'ing UE support today Standing narrow base of support is challenging today with patient reaching for hand held support Rolling R<>L without dizziness    [facility] Adult PT Treatment:                                                DATE: 09/17/24 Therapeutic Exercise: Standing lumbar extension 2 x 10  Reviewed HEP- discussing centralization of pain as it relates to repeated movement  Therapeutic Activity: Hip hinge at wall with foam roller x 10   Self Care: Discussed use of TENS unit for pain modulation with recommendation to bring in her TENS unit at next visit to discuss proper setup and application.  Reviewed bending/lifting mechanics as part of previously provided handout   PATIENT EDUCATION:  Education details:see treatment  Person educated: Patient Education method: Explanation, demo, cues, handout Education comprehension: verbalized understanding, returned demo, cues   HOME EXERCISE PROGRAM: Access Code: [email or web address] URL: [email or web address] Date: 10/29/2024 Prepared by: Lucie Meeter  Exercises - Sit to Stand  - 1 x daily - 3 x weekly - 1 sets - 10 reps - Toe Raises with Counter Support  - 1 x daily - 3 x weekly - 2 sets - 10 reps - Heel Raises with Counter Support  - 1 x daily - 3 x weekly - 2 sets - 10 reps - Standing Hip  Abduction with Counter Support  - 1 x daily - 3 x weekly - 2 sets - 10 reps - Gastroc Stretch on Wall  - 1 x daily - 3 x weekly - 3 sets - 30 sec  hold - Towel Scrunches  - 1 x daily - 3 x weekly - 1 sets - 5 reps - Standing Elbow Extension with Self-Anchored Resistance  - 1 x daily - 3 x weekly - 2 sets - 10 reps - Standing Single Arm Elbow Flexion with Resistance  - 1 x daily - 3 x weekly - 2 sets - 10 reps - Standing Gaze Stabilization with Head Rotation  - 1 x daily - 7 x weekly - 2 sets - 10 reps - 5 Minute Guided Meditation  - 7 x weekly - 10 Minute Guided Meditation  - 7 x weekly  Patient Education - Treating Persistent Pain Without Medications: Mindfulness  ASSESSMENT:  CLINICAL IMPRESSION: [place] presents with new referral to evaluate and treat bilateral elbow pain. She reports chronic, worsening elbow pain over the past 18 months of insidious onset. She reports difficulty with carrying and fine motor tasks secondary to pain. Re-evaluation was performed including elbow assessment and re-assessment of other current treatment diagnoses including low back pain, Lt ankle arthroscopy, and unstable gait. Upon assessment she is noted to have bilateral elbow and wrist weakness with elbow pain provoked with wrist extension MMT. She has met all established goals as they relate to the ankle and dizziness. She is making steady progress towards goals specific for her low back pain. She will benefit from continued skilled PT 1-2 weekly for an additional 6 weeks to progress her functional strength and mobility working towards developing a comprehensive home  program that she can continue independently to assist in management of her chronic conditions in order to optimize her function and assist in overall pain reduction.   EVAL: Patient is a 78 y.o. female who was seen today for physical therapy evaluation and treatment for s/p Lt ankle arthroscopy Brostrom stabilization, peroneus brevis and longus  debridement and tenosynovectomy on 03/12/24. She demonstrates ROM, strength, gait and balance deficits that are consistent with her recent post-operative status. She will benefit from skilled PT to address the above stated deficits in order to return to optimal function.   OBJECTIVE IMPAIRMENTS: Abnormal gait, decreased activity tolerance, decreased balance, decreased endurance, decreased knowledge of condition, decreased mobility, difficulty walking, decreased ROM, decreased strength, increased edema, impaired flexibility, impaired sensation, improper body mechanics, postural dysfunction, and pain.   GOALS: Goals reviewed with patient? Yes SHORT TERM GOALS: Target date: 11/19/2024 (updated 10/29/24)   Patient will demonstrate use of TENS unit for home to manage chronic low back pain. Baseline: to bring next visit 10/29/24: independent use of TENS unit as needed for pain  Goal status: MET  2.  Patient will improve 5 time sit<>stand to < or equal to 18 seconds.  Baseline: 22.53 seconds 10/29/24: 19.9 seconds  Goal status: PROGRESSING  LONG TERM GOALS: Target date: 12/13/24 (updated 10/29/24)   Patient will score >/= 60/104 on the Foot and Ankle Disability Index (MDC 7) to signify clinically meaningful improvement in functional abilities.  Baseline: see above Goal status: MET  2.  Patient will maintain tandem stance for at least 10 seconds to improve gait stability.  Baseline: see above 06/26/24: tandem 1 second 08/04/24: tandem 4 second 10/29/24: tandem 12 seconds  Goal status: MET   3.  Patient will be modified independent with curb/stair negotiation.  Baseline: uses cane and requires support from spouse  06/26/24: requires cane  Goal status: MET  4.  Patient will self-report ability to tolerate at least 30 minutes of standing/walking activity.  Baseline: 10 minutes  06/26/24: 20 minutes  08/04/24: limited due to back pain and dizziness, 10 minutes  10/29/24: 20-30 minutes  Goal status:  MET  5.  Patient will demonstrate at least 4/5 Lt ankle strength to improve stability when navigating on uneven terrain.  Baseline: see above  Goal status: MET  6.  Patient will be independent with advanced home program to progress/maintain current level of function.  Baseline: initial HEP issued  Goal status: MET   7. Patient will tolerate rolling R and L without d/o dizziness.   Baseline: 4/10 dizzines 08/04/24: 4/10 dizziness baseline    Goal status: MET 09/22/24: baseline dizziness today is 1/10  (began again this weekend) Rolling is so minute I don't think it's dizziness   8. Patient will tolerate gaze x 1 viewing in sitting x 20 seconds to work towards improved tolerance to turns in standing.   Baseline: Unable to do 2 reps in sitting   08/04/24: VOR x 1, 20 seconds    Goal status: MET   9. Patient will score </= 40 % disability on the Oswestry Disability Index (MCID is 20%) to signify clinically meaningful improvement in functional abilities.    Baseline: see above   Goal status: PROGRESSING   10. Patient will demonstrate proper lifting mechanics to improve her tolerance to loading/unloading the dishwasher.    Baseline: increased pain, limits bending activity   10/29/24: continues to not lift at home due to pain.    Goal status: PROGRESSING  11. Patient will be  indep with final HEP progression.  Baseline: has HEP will need progression Goal status: PROGRESSING   12. Patient will demonstrate at least 4+/5 bilateral elbow and wrist strength to improve ability to carry items.  Baseline: see above  Goal Status: NEW    PLAN:  PT FREQUENCY: 1-2x/week  PT DURATION: 6 weeks  PLANNED INTERVENTIONS: [id number]- PT Re-evaluation, 97750- Physical Performance Testing, 97110-Therapeutic exercises, 97530- Therapeutic activity, [id number]- Neuromuscular re-education, 97535- Self Care, [id number]- Manual therapy, [id number]- Gait training, [phone]- Aquatic Therapy, [phone]- Vasopneumatic device, [phone] (1-2  muscles), [phone] (3+ muscles)- Dry Needling, Taping, Cryotherapy, and Moist heat  PLAN FOR NEXT SESSION: review and progress HEP; focus on generalized strengthening and working towards independent home program. Architectural technologist, functional strengthening and balance activity   Lucie Meeter, PT, DPT, ATC 10/30/24 1:18 PM

## 2024-10-30 NOTE — Telephone Encounter (Signed)
   Name: [name]  DOB: 27-Dec-1945  MRN: [id number]  Primary Cardiologist: Redell Shallow, MD   Preoperative team, please contact this patient and set up a phone call appointment for further preoperative risk assessment. Please obtain consent and complete medication review. Thank you for your help.  I confirm that guidance regarding antiplatelet and oral anticoagulation therapy has been completed and, if necessary, noted below:  Ideally aspirin  should be continued without interruption, however if the bleeding risk is too great, aspirin  may be held for 5-7 days prior to surgery. Please resume aspirin  post operatively when it is felt to be safe from a bleeding standpoint.     I also confirmed the patient resides in the state of [place] . As per [facility] Board telemedicine laws, the patient must reside in the state in which the provider is licensed.   [name], NP 10/30/2024, 11:37 AM [facility]

## 2024-10-30 NOTE — Assessment & Plan Note (Signed)
 On aspirin  81 mg daily.  Followed by cardiology

## 2024-10-31 ENCOUNTER — Telehealth: Payer: Self-pay

## 2024-10-31 ENCOUNTER — Encounter: Payer: Self-pay | Admitting: Student

## 2024-10-31 ENCOUNTER — Ambulatory Visit: Admitting: Student

## 2024-10-31 VITALS — BP 129/69 | HR 79 | Ht 65.0 in | Wt 129.0 lb

## 2024-10-31 DIAGNOSIS — F5101 Primary insomnia: Secondary | ICD-10-CM

## 2024-10-31 DIAGNOSIS — N1832 Chronic kidney disease, stage 3b: Secondary | ICD-10-CM

## 2024-10-31 DIAGNOSIS — R102 Pelvic and perineal pain unspecified side: Secondary | ICD-10-CM | POA: Diagnosis not present

## 2024-10-31 DIAGNOSIS — M549 Dorsalgia, unspecified: Secondary | ICD-10-CM | POA: Diagnosis not present

## 2024-10-31 DIAGNOSIS — I10 Essential (primary) hypertension: Secondary | ICD-10-CM

## 2024-10-31 DIAGNOSIS — M25559 Pain in unspecified hip: Secondary | ICD-10-CM | POA: Diagnosis not present

## 2024-10-31 DIAGNOSIS — J4489 Other specified chronic obstructive pulmonary disease: Secondary | ICD-10-CM | POA: Diagnosis not present

## 2024-10-31 DIAGNOSIS — I2510 Atherosclerotic heart disease of native coronary artery without angina pectoris: Secondary | ICD-10-CM

## 2024-10-31 DIAGNOSIS — N3010 Interstitial cystitis (chronic) without hematuria: Secondary | ICD-10-CM

## 2024-10-31 DIAGNOSIS — F418 Other specified anxiety disorders: Secondary | ICD-10-CM | POA: Diagnosis not present

## 2024-10-31 DIAGNOSIS — M4150 Other secondary scoliosis, site unspecified: Secondary | ICD-10-CM | POA: Diagnosis not present

## 2024-10-31 DIAGNOSIS — M5416 Radiculopathy, lumbar region: Secondary | ICD-10-CM | POA: Diagnosis not present

## 2024-10-31 DIAGNOSIS — G8929 Other chronic pain: Secondary | ICD-10-CM | POA: Diagnosis not present

## 2024-10-31 MED ORDER — TRAZODONE HCL 100 MG PO TABS: 100.0000 mg | ORAL_TABLET | Freq: Every day | ORAL | 1 refills | Status: AC

## 2024-10-31 NOTE — Telephone Encounter (Signed)
 Called patient to schedule an appointment for a televisit pre-op clearance on 11/14/24 @ 9:40. Meds, Rec, and consent done

## 2024-10-31 NOTE — Progress Notes (Signed)
 Subjective:     Patient ID: [name], female    DOB: 1946-09-03, 78 y.o.   MRN: [id number]  No chief complaint on file.   HPI  Discussed the use of AI scribe software for clinical note transcription with the patient, who gave verbal consent to proceed.  History of Present Illness [name] is a 78 year old female with chronic back pain and scoliosis who presents for follow-up after recent back injections.  She experiences significant relief from chronic back pain following recent injections, which included one in the center and one on each side of her back. Reports substantial relief after injection, follows with [facility] pain management, Dr. Bonner.  She has sleep disturbances and was recently prescribed Seroquel las OV, which she discontinued. She has returned to Trazodone  50 mg,  and reports sleeping well.   She has stage 3B chronic kidney disease, follows with nephrology. She experiences chronic pelvic pain and has a history of interstitial cystitis, with pain during intercourse and constant pelvic discomfort. Feels is is worsening. Pelvic floor physical therapy was beneficial but discontinued due to logistical issues.  She has COPD . Her blood pressure is well-controlled at home, typically around 128-130/60-70. She is not diabetic but monitors her diet closely, favoring a Mediterranean diet.  Patient denies fever, chills, SOB, CP, palpitations, dyspnea, edema, HA, vision changes, N/V/D, abdominal pain, rash, weight changes, and recent illness or hospitalizations.         10/20/2024    8:47 AM 08/14/2024    9:51 AM 06/23/2024    9:46 AM 03/04/2024    9:02 AM 01/11/2024    8:59 AM  Depression screen PHQ 2/9  Decreased Interest 0 0 0 0 2  Down, Depressed, Hopeless 0 1 0 0 0  PHQ - 2 Score 0 1 0 0 2  Altered sleeping 1 1 0 0 0  Tired, decreased energy 3 1 0 0 0  Change in appetite 2 1 0 0 0  Feeling bad or failure about yourself  0 0 0 0 0  Trouble concentrating  2 1 0 0 2  Moving slowly or fidgety/restless 0 0 0 0 0  Suicidal thoughts 0 0 0 0 0  PHQ-9 Score 8  5  0  0  4   Difficult doing work/chores Somewhat difficult Somewhat difficult Not difficult at all Not difficult at all Not difficult at all     Data saved with a previous flowsheet row definition        10/20/2024    8:47 AM 08/14/2024    9:51 AM 09/04/2023   12:27 PM 05/28/2023    9:45 AM  GAD 7 : Generalized Anxiety Score  Nervous, Anxious, on Edge 1 1  0  Control/stop worrying 0 0  0  Worry too much - different things 0 0  0  Trouble relaxing 1 1  0  Restless 0 0  0  Easily annoyed or irritable 1 1  0  Afraid - awful might happen 0 0  0  Total GAD 7 Score 3 3  0  Anxiety Difficulty Somewhat difficult Somewhat difficult  Not difficult at all     Information is confidential and restricted. Go to Review Flowsheets to unlock data.      Wt Readings from Last 3 Encounters:  10/31/24 129 lb (58.5 kg)  10/20/24 130 lb (59 kg)  10/13/24 128 lb (58.1 kg)    History of Present Illness  Health Maintenance Due  Topic Date Due   COVID-19 Vaccine (5 - 2025-26 season) 08/25/2024   HEMOGLOBIN A1C  10/21/2024    Past Medical History:  Diagnosis Date   Abdominal pain 10/26/2013   Allergic rhinitis 01/25/2016   Allergy     ANA positive 07/29/2013   Anemia    Arthralgia 08/18/2013   Arthritis    Asthma, allergic 08/05/2012   Atrial flutter    past history- not current   Atypical chest pain 01/01/2015   Bilateral carpal tunnel syndrome 03/04/2015   CAD in native artery 08/25/2010   Minor CAD 2009, 2011, low risk Myoview  2013 and 2016        Cataract    bilaterally removed    Cellulitis of breast 02/05/2023   Chronic constipation    Chronic cough 01/09/2017   Chronic interstitial cystitis 02/01/2017   Chronic night sweats 01/08/2015   Common bile duct dilatation 02/13/2023   Concussion    x 3   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   DDD (degenerative disc  disease), cervical 09/17/2018   Degenerative scoliosis 04/22/2018   Deviated septum 05/12/2016   Dysphagia    Dyspnea    Dysuria 03/09/2021   Elevated alkaline phosphatase level 01/25/2023   Essential hypertension 08/25/2010   External hemorrhoids 02/05/2015   Facial trauma 01/22/2023   Family history of adverse reaction to anesthesia    Family history of malignant hyperthermia    father had this   Fibrocystic breast disease 10/18/2010   Fibromyalgia    Frequency of urination    Generalized anxiety disorder 08/03/2010   GERD (gastroesophageal reflux disease)    Headache 10/13/2013   High serum vitamin D  05/18/2021   History of chronic bronchitis    History of colonic polyp 02/25/2019   History of hiatal hernia    History of tachycardia    CONTROLLED  WITH ATENOLOL    Hyperglycemia 03/08/2021   Hyperkalemia 09/28/2022   Hyperlipidemia    Intertrigo 12/13/2022   Kidney stone 02/26/2019   Knee pain, right 04/30/2015   Left hip pain 04/30/2015   Leg pain 02/24/2011   Qualifier: Diagnosis of   By: Antonio DOJamee         Leukocytosis 07/05/2022   Local reaction to hymenoptera sting 05/09/2023   Low back pain 09/08/2020   Major depressive disorder 02/05/2013   Malignant neoplasm of lower-outer quadrant of right breast of female, estrogen receptor positive 01/03/2013   Nasal turbinate hypertrophy 05/12/2016   Oral herpes 02/05/2023   OSA and COPD overlap syndrome 06/10/2020   Osteoporosis 01/2019   T score -2.2 stable/improved from prior study   Pelvic pain    Peripheral neuropathy 05/22/2014   Personal history of radiation therapy    Pneumonia    Primary insomnia 06/25/2017   Pulmonary nodules 02/12/2015   Rash and other nonspecific skin eruption 05/09/2023   Recurrent falls 05/02/2020   Recurrent sinusitis 02/25/2019   Right arm pain 07/20/2016   RLS (restless legs syndrome) 11/09/2019   RUQ pain 07/05/2022   S/P radiation therapy 11/12/12 - 12/05/12   right Breast    Sciatica 04/30/2015   Sepsis 2014   from UTI    Sjogren's disease 12/12/2021   Splenic lesion 09/02/2012   Sprain of lateral ligament of ankle joint 07/31/2023   Stage 3 chronic kidney disease    Statin intolerance 02/29/2020   Stricture and stenosis of esophagus 10/19/2011   Tongue lesion 02/05/2023   Tremor 07/05/2022   Urgency of urination  Vertigo 01/22/2023   Vitamin B12 deficiency 03/08/2021   Vocal cord cyst 05/18/2021    Past Surgical History:  Procedure Laterality Date   24 HOUR PH STUDY N/A 04/03/2016   Procedure: 24 HOUR PH STUDY;  Surgeon: Elspeth Deward Naval, MD;  Location: [facility];  Service: Gastroenterology;  Laterality: N/A;   ANKLE ARTHROSCOPY WITH RECONSTRUCTION Left 03/12/2024   Procedure: ANKLE ARTHROSCOPY WITH OPEN LATERAL ANKLE LIGAMENT STABILIZATION;  Surgeon: Barton Drape, MD;  Location: [facility];  Service: Orthopedics;  Laterality: Left;   BREAST BIOPSY Right 08/23/2012   ADH   BREAST BIOPSY Right 10/11/2012   Ductal Carcinoma   BREAST EXCISIONAL BIOPSY Right 09/04/2013   benign   BREAST LUMPECTOMY Right 10/11/2012   W/ SLN BX   CARDIAC CATHETERIZATION  09-13-2007  DR MORRIS   WELL-PRESERVED LVF/  DIFFUSE SCATTERED CORONARY CALCIFACATION AND ATHEROSCLEROSIS WITHOUT OBSTRUCTION   CARDIAC CATHETERIZATION  08-04-2010  DR MCALHANY   NON-OBSTRUCTIVE CAD/  pLAD 40%/  oLAD 30%/  mLAD 30%/  pRCA 30%/  EF 60%   CARDIOVASCULAR STRESS TEST  06-18-2012  DR McALHANY   LOW RISK NUCLEAR STUDY/  SMALL FIXED AREA OF MODERATELY DECREASED UPTAKE IN ANTEROSEPTAL WALL WHICH MAY BE ARTIFACTUAL/  NO ISCHEMIA/  EF 68%   COLONOSCOPY  09/29/2010   CYSTOSCOPY     CYSTOSCOPY WITH HYDRODISTENSION AND BIOPSY N/A 03/06/2014   Procedure: CYSTOSCOPY/HYDRODISTENSION/ INSTILATION OF MARCAINE  AND PYRIDIUM ;  Surgeon: Arlena LILLETTE Gal, MD;  [facility] ;  Service: Urology;  Laterality: N/A;   CYSTOSCOPY/URETEROSCOPY/HOLMIUM  LASER/STENT PLACEMENT Left 03/21/2023   Procedure: CYSTOSCOPY LEFT RETROGRADE PYELOGRAM, LEFT URETEROSCOPY, HOLMIUM LASER LITHOTRIPSYM, AND LEFT URETERAL STENT PLACEMENT;  Surgeon: Devere Lonni Righter, MD;  Location: [facility];  Service: Urology;  Laterality: Left;  60 MINUTES   ESOPHAGEAL MANOMETRY N/A 04/03/2016   Procedure: ESOPHAGEAL MANOMETRY (EM);  Surgeon: Elspeth Deward Naval, MD;  Location: [facility];  Service: Gastroenterology;  Laterality: N/A;   EXTRACORPOREAL SHOCK WAVE LITHOTRIPSY Left 02/06/2019   Procedure: EXTRACORPOREAL SHOCK WAVE LITHOTRIPSY (ESWL);  Surgeon: Ottelin, Mark, MD;  Location: [facility];  Service: Urology;  Laterality: Left;   NASAL SINUS SURGERY  1985   ORIF RIGHT ANKLE  FX  2006   POLYPECTOMY     REMOVAL VOCAL CORD CYST  01/2013   REPAIR OF PERONEUS BREVIS TENDON Left 03/12/2024   Procedure: REPAIR OF PERONEUS TENDON;  Surgeon: Barton Drape, MD;  Location: [facility];  Service: Orthopedics;  Laterality: Left;   RIGHT BREAST BX  08/23/2012   RIGHT HAND SURGERY  X3  LAST ONE 2009   INCLUDES  ORIF RIGHT 5TH FINGER AND REVISION TWICE   SKIN CANCER EXCISION     face,arms   TONSILLECTOMY AND ADENOIDECTOMY  AGE 30   TOTAL ABDOMINAL HYSTERECTOMY W/ BILATERAL SALPINGOOPHORECTOMY  1982   W/  APPENDECTOMY   TRANSTHORACIC ECHOCARDIOGRAM  06/24/2012   GRADE I DIASTOLIC DYSFUNCTION/  EF 55-60%/  MILD MR   UPPER GASTROINTESTINAL ENDOSCOPY      Family History  Problem Relation Age of Onset   Rectal cancer Mother    Colon cancer Mother    Pancreatic cancer Mother    Diabetes Mother    Breast cancer Mother 33   Breast cancer Maternal Aunt        breast   Birth defects Maternal Aunt    Irritable bowel syndrome Son    Heart disease Son        CAD, MV replacement  Allergic Disorder Daughter    Diabetes Daughter    Arthritis Daughter    Colon cancer Father    Colon polyps Father    Diabetes Father    Stroke Father    Heart disease  Father        CHF   Hyperlipidemia Father    Hypertension Father    Arthritis Father    Breast cancer Paternal Aunt    Breast cancer Paternal Aunt    Arthritis Paternal Uncle    Allergic Disorder Daughter    Heart disease Cousin        CAD, had a blood clot after stenting   Arthritis Sister    Esophageal cancer Neg Hx    Stomach cancer Neg Hx     Social History   Socioeconomic History   Marital status: Married    Spouse name: Tanda    Number of children: 3   Years of education: 16   Highest education level: Bachelor's degree (e.g., BA, AB, BS)  Occupational History   Occupation: Retired    Comment: [employer]  Tobacco Use   Smoking status: Former    Current packs/day: 0.00    Average packs/day: 1 pack/day for 15.0 years (15.0 ttl pk-yrs)    Types: Cigarettes    Start date: 05/27/1990    Quit date: 05/27/2005    Years since quitting: 19.4    Passive exposure: Never   Smokeless tobacco: Never  Vaping Use   Vaping status: Never Used  Substance and Sexual Activity   Alcohol use: No    Alcohol/week: 0.0 standard drinks of alcohol   Drug use: No   Sexual activity: Not Currently    Partners: Male    Birth control/protection: Surgical    Comment: 1st intercourse 78 yo-Fewer than 5 partners, hysterectomy  Other Topics Concern   Not on file  Social History Narrative   Lives with husband.   Caffeine use: 1/2 cup per day   Exercise-- 2days a week [employer],  Water  aerobics, walking       Retired engineer, civil (consulting)   No dietary restrictions, tries to maintain a heart healthy diet   Very pleasant lady   Social Drivers of Corporate Investment Banker Strain: Low Risk  (10/19/2024)   Overall Financial Resource Strain (CARDIA)    Difficulty of Paying Living Expenses: Not hard at all  Food Insecurity: No Food Insecurity (10/19/2024)   Hunger Vital Sign    Worried About Running Out of Food in the Last Year: Never true    Ran Out of Food in the Last Year: Never true  Transportation Needs: No  Transportation Needs (10/19/2024)   PRAPARE - Administrator, Civil Service (Medical): No    Lack of Transportation (Non-Medical): No  Physical Activity: Insufficiently Active (10/19/2024)   Exercise Vital Sign    Days of Exercise per Week: 2 days    Minutes of Exercise per Session: 20 min  Stress: No Stress Concern Present (10/19/2024)   [employer] of Occupational Health - Occupational Stress Questionnaire    Feeling of Stress: Only a little  Social Connections: Socially Integrated (10/19/2024)   Social Connection and Isolation Panel    Frequency of Communication with Friends and Family: More than three times a week    Frequency of Social Gatherings with Friends and Family: Twice a week    Attends Religious Services: More than 4 times per year    Active Member of [employer] or Organizations: Yes  Attends Banker Meetings: More than 4 times per year    Marital Status: Married  Catering Manager Violence: Not At Risk (03/04/2024)   Humiliation, Afraid, Rape, and Kick questionnaire    Fear of Current or Ex-Partner: No    Emotionally Abused: No    Physically Abused: No    Sexually Abused: No    Outpatient Medications Prior to Visit  Medication Sig Dispense Refill   acetaminophen  (TYLENOL ) 500 MG tablet Take 1,000 mg by mouth every 6 (six) hours as needed for mild pain or headache.      albuterol  (VENTOLIN  HFA) 108 (90 Base) MCG/ACT inhaler Inhale 2 puffs into the lungs every 4 (four) hours as needed for wheezing or shortness of breath (coughing fits). 18 g 1   amLODipine  (NORVASC ) 5 MG tablet Take 1 tablet by mouth once daily 90 tablet 3   aspirin  EC 81 MG tablet Take 1 tablet (81 mg total) by mouth daily. 90 tablet 3   atenolol  (TENORMIN ) 25 MG tablet Take 1 tablet (25 mg total) by mouth 2 (two) times daily. 180 tablet 1   carboxymethylcellul-glycerin (OPTIVE) 0.5-0.9 % ophthalmic solution Place 1 drop into both eyes daily as needed for dry eyes.      cyanocobalamin  (VITAMIN B12) 1000 MCG tablet Take 1,000 mg by mouth daily with breakfast.     diclofenac Sodium (VOLTAREN) 1 % GEL Apply 2 g topically daily as needed (Back pain).     DULoxetine  (CYMBALTA ) 60 MG capsule Take 1 capsule by mouth twice daily 180 capsule 0   EPINEPHrine  0.3 mg/0.3 mL IJ SOAJ injection Inject 0.3 mg into the muscle as needed for anaphylaxis. 2 each 1   fluticasone  (FLONASE ) 50 MCG/ACT nasal spray Place 2 sprays into both nostrils daily. 16 g 5   fluticasone  (FLOVENT  HFA) 220 MCG/ACT inhaler Inhale 1-2 puffs into the lungs in the morning and at bedtime. 1 each 12   folic acid  (FOLVITE ) 1 MG tablet Take 1 tablet (1 mg total) by mouth daily.     furosemide  (LASIX ) 20 MG tablet TAKE 1 TABLET BY MOUTH AS NEEDED FOR EDEMA (Patient taking differently: Take 20 mg by mouth. Once every 3 days) 90 tablet 3   gabapentin  (NEURONTIN ) 300 MG capsule Take 2 capsules (600 mg total) by mouth in the morning AND 1 capsule (300 mg total) every evening AND 3 capsules (900 mg total) at bedtime. 180 capsule 1   hydrOXYzine  (ATARAX ) 10 MG tablet Take 0.5-2 tablets (5-20 mg total) by mouth 3 (three) times daily as needed for anxiety (not with Loratadine ). 90 tablet 2   linaclotide  (LINZESS ) 145 MCG CAPS capsule Take 1 capsule (145 mcg total) by mouth daily before breakfast. 30 capsule 5   loratadine  (CLARITIN ) 10 MG tablet Take 10 mg by mouth daily as needed for rhinitis.     nitroGLYCERIN  (NITROSTAT ) 0.4 MG SL tablet Place 1 tablet (0.4 mg total) under the tongue every 5 (five) minutes x 3 doses as needed for chest pain. 25 tablet 2   nystatin  cream (MYCOSTATIN ) Apply 1 application topically 2 (two) times daily. 30 g 2   pantoprazole  (PROTONIX ) 40 MG tablet Take 1 tablet by mouth 2 (two) times daily.     polyethylene glycol (MIRALAX ) 17 g packet Take 17 g by mouth 2 (two) times daily. 14 each 0   REPATHA  SURECLICK 140 MG/ML SOAJ INJECT CONTENTS OF 1 PEN SUBCUTANEOUSLY EVERY 14 DAYS 6 mL 0   RSV  vaccine recomb adjuvanted (AREXVY)  120 MCG/0.5ML injection Inject into the muscle. 1 each 0   sucralfate  (CARAFATE ) 1 GM/10ML suspension Take 10 mLs (1 g total) by mouth 4 (four) times daily -  with meals and at bedtime. (Patient not taking: Reported on 10/13/2024) 420 mL 1   Tiotropium Bromide-Olodaterol (STIOLTO RESPIMAT) 2.5-2.5 MCG/ACT AERS Inhale 2 puffs into the lungs daily. 1 each 5   Tiotropium Bromide-Olodaterol (STIOLTO RESPIMAT) 2.5-2.5 MCG/ACT AERS 1 sample     traMADol  (ULTRAM ) 50 MG tablet Take 1 tablet by mouth 3 (three) times daily as needed.     Vitamin D , Ergocalciferol , (DRISDOL ) 1.25 MG (50000 UNIT) CAPS capsule Take 1 capsule by mouth once a week 4 capsule 0   QUEtiapine (SEROQUEL) 25 MG tablet Take 1 tablet (25 mg total) by mouth at bedtime. Take  tablet (12.5 mg) by mouth at bedtime. May increase to 1 full tablet (25 mg) nightly as needed. 30 tablet 1   No facility-administered medications prior to visit.    Allergies  Allergen Reactions   Clindamycin Hcl Shortness Of Breath and Rash   Levofloxacin  Rash    Other reaction(s): sick, rash hives severe itching   Other Other (See Comments)    Father had malignant hyperthermia   Penicillins Anaphylaxis   Rosuvastatin  Anaphylaxis   Baclofen  Rash   Codeine Hives and Nausea Only   Erythromycin Hives   Haemophilus Influenzae Vaccines Other (See Comments)    Local reaction at site   Latex Hives   Lincomycin Other (See Comments)   Pentazocine Lactate Other (See Comments)    HALLUCINATION   Pneumococcal Vaccine Polyvalent Hives, Swelling and Other (See Comments)    REACTION: swelling at injection site   Prednisone  Rash    Other reaction(s): Rash, Hives - can tolerate if she takes with benadryl     Tamoxifen  Nausea And Vomiting   Selenium    Seroquel [Quetiapine] Dermatitis   Doxycycline  Rash   Fluzone [Influenza Virus Vaccine] Hives    Local reaction at the site   Haemophilus Influenzae Other (See Comments)    Local  reaction at the site     ROS     Objective:    Physical Exam Vitals reviewed.  Constitutional:      General: She is not in acute distress.    Appearance: She is not toxic-appearing.  HENT:     Head: Normocephalic and atraumatic.     Mouth/Throat:     Mouth: Mucous membranes are moist.     Pharynx: Oropharynx is clear.  Eyes:     Extraocular Movements: Extraocular movements intact.     Pupils: Pupils are equal, round, and reactive to light.  Cardiovascular:     Rate and Rhythm: Normal rate and regular rhythm.     Pulses: Normal pulses.     Heart sounds: Normal heart sounds. No murmur heard. Pulmonary:     Effort: Pulmonary effort is normal. No respiratory distress.     Breath sounds: Normal breath sounds. No wheezing.  Musculoskeletal:        General: No swelling.     Cervical back: Neck supple.  Skin:    General: Skin is warm and dry.  Neurological:     General: No focal deficit present.     Mental Status: She is alert and oriented to person, place, and time.  Psychiatric:        Mood and Affect: Mood normal.        Behavior: Behavior normal.  Thought Content: Thought content normal.        Judgment: Judgment normal.      BP 129/69   Pulse 79   Ht 5' 5 (1.651 m)   Wt 129 lb (58.5 kg)   SpO2 98%   BMI 21.47 kg/m  Wt Readings from Last 3 Encounters:  10/31/24 129 lb (58.5 kg)  10/20/24 130 lb (59 kg)  10/13/24 128 lb (58.1 kg)       Assessment & Plan:   Problem List Items Addressed This Visit     CAD in native artery - Primary   On aspirin  81 mg daily.  Followed by cardiology      Chronic back pain   Follows with pain management- Dr. Bonner with [facility].  Chronic dorsalgia with scoliosis managed by pain management. Recent injections provided significant relief. Surgery not recommended due to age and spinal condition. Receiving back injections. Pt reports she is not a surgical candidate.       Relevant Medications   traZODone   (DESYREL ) 100 MG tablet   Chronic interstitial cystitis   Hydrate and monitor. Chronic pelvic pain and interstitial cystitis with worsening symptoms.  -Refer to UroGYN for further evaluation - Consider pelvic floor physical therapy, Pt declines at this time      Relevant Medications   traZODone  (DESYREL ) 100 MG tablet   COPD with asthma (HCC)   Denies recent exacerbations. Follows with Pulmonology.      Degenerative scoliosis   Depression with anxiety   Stable on current medications. Follows with Behavioral Health.        Relevant Medications   traZODone  (DESYREL ) 100 MG tablet   Essential hypertension   Well controlled, no changes to meds. Encouraged heart healthy diet such as the DASH diet and exercise as tolerated.        Primary insomnia   -d/c Seroquel -Pt Managed with trazodone  50 mg, effective.  - Continue trazodone  HS for insomnia      Stage 3 chronic kidney disease   Hydrate and monitor, follows with nephrology-[facility].       Other Visit Diagnoses       Pelvic pain       Relevant Orders   Ambulatory referral to Urogynecology       I have discontinued Sheri RAMAN. Hollenbach's QUEtiapine. I am also having her start on traZODone . Additionally, I am having her maintain her acetaminophen , aspirin  EC, cyanocobalamin , nystatin  cream, sucralfate , diclofenac Sodium, loratadine , OPTIVE, folic acid , EPINEPHrine , polyethylene glycol, furosemide , nitroGLYCERIN , traMADol , hydrOXYzine , fluticasone , amLODipine , fluticasone , albuterol , atenolol , linaclotide , pantoprazole , Repatha  SureClick, Vitamin D  (Ergocalciferol ), DULoxetine , Stiolto Respimat, Stiolto Respimat, Arexvy, and gabapentin .  Meds ordered this encounter  Medications   traZODone  (DESYREL ) 100 MG tablet    Sig: Take 1 tablet (100 mg total) by mouth at bedtime.    Dispense:  90 tablet    Refill:  1    Supervising Provider:   DOMENICA BLACKBIRD A [[id number]]

## 2024-10-31 NOTE — Assessment & Plan Note (Addendum)
 Hydrate and monitor. Chronic pelvic pain and interstitial cystitis with worsening symptoms.  -Refer to UroGYN for further evaluation - Consider pelvic floor physical therapy, Pt declines at this time

## 2024-10-31 NOTE — Assessment & Plan Note (Addendum)
 Follows with pain management- Dr. Bonner with [facility].  Chronic dorsalgia with scoliosis managed by pain management. Recent injections provided significant relief. Surgery not recommended due to age and spinal condition. Receiving back injections. Pt reports she is not a surgical candidate.

## 2024-10-31 NOTE — Assessment & Plan Note (Signed)
-  d/c Seroquel -Pt Managed with trazodone  50 mg, effective.  - Continue trazodone  HS for insomnia

## 2024-10-31 NOTE — Assessment & Plan Note (Signed)
 Stable on current medications.

## 2024-10-31 NOTE — Assessment & Plan Note (Signed)
 Well controlled, no changes to meds. Encouraged heart healthy diet such as the DASH diet and exercise as tolerated.

## 2024-10-31 NOTE — Telephone Encounter (Signed)
 Called patient to schedule an appointment for a televisit pre-op clearance on 11/14/24 @ 9:40. Meds, Rec, and consent done        Patient Consent for Virtual Visit        [name] has provided verbal consent on 10/31/2024 for a virtual visit (video or telephone).   CONSENT FOR VIRTUAL VISIT FOR:  [name]  By participating in this virtual visit I agree to the following:  I hereby voluntarily request, consent and authorize [facility] and its employed or contracted physicians, physician assistants, nurse practitioners or other licensed health care professionals (the Practitioner), to provide me with telemedicine health care services (the "Services) as deemed necessary by the treating Practitioner. I acknowledge and consent to receive the Services by the Practitioner via telemedicine. I understand that the telemedicine visit will involve communicating with the Practitioner through live audiovisual communication technology and the disclosure of certain medical information by electronic transmission. I acknowledge that I have been given the opportunity to request an in-person assessment or other available alternative prior to the telemedicine visit and am voluntarily participating in the telemedicine visit.  I understand that I have the right to withhold or withdraw my consent to the use of telemedicine in the course of my care at any time, without affecting my right to future care or treatment, and that the Practitioner or I may terminate the telemedicine visit at any time. I understand that I have the right to inspect all information obtained and/or recorded in the course of the telemedicine visit and may receive copies of available information for a reasonable fee.  I understand that some of the potential risks of receiving the Services via telemedicine include:  Delay or interruption in medical evaluation due to technological equipment failure or disruption; Information  transmitted may not be sufficient (e.g. poor resolution of images) to allow for appropriate medical decision making by the Practitioner; and/or  In rare instances, security protocols could fail, causing a breach of personal health information.  Furthermore, I acknowledge that it is my responsibility to provide information about my medical history, conditions and care that is complete and accurate to the best of my ability. I acknowledge that Practitioner's advice, recommendations, and/or decision may be based on factors not within their control, such as incomplete or inaccurate data provided by me or distortions of diagnostic images or specimens that may result from electronic transmissions. I understand that the practice of medicine is not an exact science and that Practitioner makes no warranties or guarantees regarding treatment outcomes. I acknowledge that a copy of this consent can be made available to me via my patient portal [facility] MyChart), or I can request a printed copy by calling the office of [facility].    I understand that my insurance will be billed for this visit.   I have read or had this consent read to me. I understand the contents of this consent, which adequately explains the benefits and risks of the Services being provided via telemedicine.  I have been provided ample opportunity to ask questions regarding this consent and the Services and have had my questions answered to my satisfaction. I give my informed consent for the services to be provided through the use of telemedicine in my medical care

## 2024-10-31 NOTE — Assessment & Plan Note (Signed)
 Hydrate and monitor, follows with nephrology-[facility].

## 2024-10-31 NOTE — Assessment & Plan Note (Signed)
 Denies recent exacerbations. Follows with Pulmonology.

## 2024-11-02 ENCOUNTER — Other Ambulatory Visit: Payer: Self-pay | Admitting: Student

## 2024-11-02 DIAGNOSIS — E559 Vitamin D deficiency, unspecified: Secondary | ICD-10-CM

## 2024-11-05 ENCOUNTER — Ambulatory Visit: Payer: Self-pay

## 2024-11-05 NOTE — Therapy (Incomplete)
 OUTPATIENT PHYSICAL THERAPY TREATMENT     Patient Name: [name] MRN: [id number] DOB:Jan 07, 1946, 78 y.o., female Today's Date: 11/05/2024  END OF SESSION:      Past Medical History:  Diagnosis Date   Abdominal pain 10/26/2013   Allergic rhinitis 01/25/2016   Allergy     ANA positive 07/29/2013   Anemia    Arthralgia 08/18/2013   Arthritis    Asthma, allergic 08/05/2012   Atrial flutter    past history- not current   Atypical chest pain 01/01/2015   Bilateral carpal tunnel syndrome 03/04/2015   CAD in native artery 08/25/2010   Minor CAD 2009, 2011, low risk Myoview  2013 and 2016        Cataract    bilaterally removed    Cellulitis of breast 02/05/2023   Chronic constipation    Chronic cough 01/09/2017   Chronic interstitial cystitis 02/01/2017   Chronic night sweats 01/08/2015   Common bile duct dilatation 02/13/2023   Concussion    x 3   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   DDD (degenerative disc disease), cervical 09/17/2018   Degenerative scoliosis 04/22/2018   Deviated septum 05/12/2016   Dysphagia    Dyspnea    Dysuria 03/09/2021   Elevated alkaline phosphatase level 01/25/2023   Essential hypertension 08/25/2010   External hemorrhoids 02/05/2015   Facial trauma 01/22/2023   Family history of adverse reaction to anesthesia    Family history of malignant hyperthermia    father had this   Fibrocystic breast disease 10/18/2010   Fibromyalgia    Frequency of urination    Generalized anxiety disorder 08/03/2010   GERD (gastroesophageal reflux disease)    Headache 10/13/2013   High serum vitamin D  05/18/2021   History of chronic bronchitis    History of colonic polyp 02/25/2019   History of hiatal hernia    History of tachycardia    CONTROLLED  WITH ATENOLOL    Hyperglycemia 03/08/2021   Hyperkalemia 09/28/2022   Hyperlipidemia    Intertrigo 12/13/2022   Kidney stone 02/26/2019   Knee pain, right 04/30/2015   Left hip pain 04/30/2015   Leg  pain 02/24/2011   Qualifier: Diagnosis of   By: Antonio DOJamee         Leukocytosis 07/05/2022   Local reaction to hymenoptera sting 05/09/2023   Low back pain 09/08/2020   Major depressive disorder 02/05/2013   Malignant neoplasm of lower-outer quadrant of right breast of female, estrogen receptor positive 01/03/2013   Nasal turbinate hypertrophy 05/12/2016   Oral herpes 02/05/2023   OSA and COPD overlap syndrome 06/10/2020   Osteoporosis 01/2019   T score -2.2 stable/improved from prior study   Pelvic pain    Peripheral neuropathy 05/22/2014   Personal history of radiation therapy    Pneumonia    Primary insomnia 06/25/2017   Pulmonary nodules 02/12/2015   Rash and other nonspecific skin eruption 05/09/2023   Recurrent falls 05/02/2020   Recurrent sinusitis 02/25/2019   Right arm pain 07/20/2016   RLS (restless legs syndrome) 11/09/2019   RUQ pain 07/05/2022   S/P radiation therapy 11/12/12 - 12/05/12   right Breast   Sciatica 04/30/2015   Sepsis 2014   from UTI    Sjogren's disease 12/12/2021   Splenic lesion 09/02/2012   Sprain of lateral ligament of ankle joint 07/31/2023   Stage 3 chronic kidney disease    Statin intolerance 02/29/2020   Stricture and stenosis of esophagus 10/19/2011   Tongue lesion 02/05/2023   Tremor 07/05/2022  Urgency of urination    Vertigo 01/22/2023   Vitamin B12 deficiency 03/08/2021   Vocal cord cyst 05/18/2021   Past Surgical History:  Procedure Laterality Date   24 HOUR PH STUDY N/A 04/03/2016   Procedure: 24 HOUR PH STUDY;  Surgeon: Elspeth Deward Naval, MD;  Location: [facility];  Service: Gastroenterology;  Laterality: N/A;   ANKLE ARTHROSCOPY WITH RECONSTRUCTION Left 03/12/2024   Procedure: ANKLE ARTHROSCOPY WITH OPEN LATERAL ANKLE LIGAMENT STABILIZATION;  Surgeon: Barton Drape, MD;  Location: [facility];  Service: Orthopedics;  Laterality: Left;   BREAST BIOPSY Right 08/23/2012   ADH   BREAST BIOPSY  Right 10/11/2012   Ductal Carcinoma   BREAST EXCISIONAL BIOPSY Right 09/04/2013   benign   BREAST LUMPECTOMY Right 10/11/2012   W/ SLN BX   CARDIAC CATHETERIZATION  09-13-2007  DR MORRIS   WELL-PRESERVED LVF/  DIFFUSE SCATTERED CORONARY CALCIFACATION AND ATHEROSCLEROSIS WITHOUT OBSTRUCTION   CARDIAC CATHETERIZATION  08-04-2010  DR MCALHANY   NON-OBSTRUCTIVE CAD/  pLAD 40%/  oLAD 30%/  mLAD 30%/  pRCA 30%/  EF 60%   CARDIOVASCULAR STRESS TEST  06-18-2012  DR McALHANY   LOW RISK NUCLEAR STUDY/  SMALL FIXED AREA OF MODERATELY DECREASED UPTAKE IN ANTEROSEPTAL WALL WHICH MAY BE ARTIFACTUAL/  NO ISCHEMIA/  EF 68%   COLONOSCOPY  09/29/2010   CYSTOSCOPY     CYSTOSCOPY WITH HYDRODISTENSION AND BIOPSY N/A 03/06/2014   Procedure: CYSTOSCOPY/HYDRODISTENSION/ INSTILATION OF MARCAINE  AND PYRIDIUM ;  Surgeon: Arlena LILLETTE Gal, MD;  Location: [facility] ;  Service: Urology;  Laterality: N/A;   CYSTOSCOPY/URETEROSCOPY/HOLMIUM LASER/STENT PLACEMENT Left 03/21/2023   Procedure: CYSTOSCOPY LEFT RETROGRADE PYELOGRAM, LEFT URETEROSCOPY, HOLMIUM LASER LITHOTRIPSYM, AND LEFT URETERAL STENT PLACEMENT;  Surgeon: Devere Lonni Righter, MD;  Location: [facility];  Service: Urology;  Laterality: Left;  60 MINUTES   ESOPHAGEAL MANOMETRY N/A 04/03/2016   Procedure: ESOPHAGEAL MANOMETRY (EM);  Surgeon: Elspeth Deward Naval, MD;  Location: [facility];  Service: Gastroenterology;  Laterality: N/A;   EXTRACORPOREAL SHOCK WAVE LITHOTRIPSY Left 02/06/2019   Procedure: EXTRACORPOREAL SHOCK WAVE LITHOTRIPSY (ESWL);  Surgeon: Ottelin, Mark, MD;  Location: [facility];  Service: Urology;  Laterality: Left;   NASAL SINUS SURGERY  1985   ORIF RIGHT ANKLE  FX  2006   POLYPECTOMY     REMOVAL VOCAL CORD CYST  01/2013   REPAIR OF PERONEUS BREVIS TENDON Left 03/12/2024   Procedure: REPAIR OF PERONEUS TENDON;  Surgeon: Barton Drape, MD;  Location: [facility];  Service: Orthopedics;  Laterality: Left;    RIGHT BREAST BX  08/23/2012   RIGHT HAND SURGERY  X3  LAST ONE 2009   INCLUDES  ORIF RIGHT 5TH FINGER AND REVISION TWICE   SKIN CANCER EXCISION     face,arms   TONSILLECTOMY AND ADENOIDECTOMY  AGE 33   TOTAL ABDOMINAL HYSTERECTOMY W/ BILATERAL SALPINGOOPHORECTOMY  1982   W/  APPENDECTOMY   TRANSTHORACIC ECHOCARDIOGRAM  06/24/2012   GRADE I DIASTOLIC DYSFUNCTION/  EF 55-60%/  MILD MR   UPPER GASTROINTESTINAL ENDOSCOPY     Patient Active Problem List   Diagnosis Date Noted   Chronic back pain 10/31/2024   Breast discharge 08/27/2024   Acute left ankle pain 08/14/2024   Pedal edema 05/20/2024   Otalgia of both ears 04/23/2024   Tinnitus of both ears 04/23/2024   IDA (iron deficiency anemia) 09/12/2023   Stage 3 chronic kidney disease    Right ankle pain 07/31/2023   Common bile duct dilatation 02/13/2023  Oral herpes 02/05/2023   Elevated alkaline phosphatase level 01/25/2023   Vertigo 01/22/2023   Intertrigo 12/13/2022   Anemia 09/28/2022   Tremor 07/05/2022   RUQ pain 07/05/2022   Leukocytosis 07/05/2022   Sjogren's disease 12/12/2021   Vocal cord cyst 05/18/2021   Vitamin D  deficiency 05/18/2021   Dysuria 03/09/2021   Vitamin B12 deficiency 03/08/2021   Hyperglycemia 03/08/2021   OSA and COPD overlap syndrome 06/10/2020   Recurrent falls 05/02/2020   Statin intolerance 02/29/2020   RLS (restless legs syndrome) 11/09/2019   Recurrent sinusitis 02/25/2019   History of colonic polyp 02/25/2019   DDD (degenerative disc disease), cervical 09/17/2018   Degenerative scoliosis 04/22/2018   Primary insomnia 06/25/2017   Chronic interstitial cystitis 02/01/2017   Deviated nasal septum 05/12/2016   Hypertrophy of nasal turbinates 05/12/2016   Dysphagia    Allergic rhinitis 01/25/2016   Dyspnea    Sciatica 04/30/2015   Bilateral carpal tunnel syndrome 03/04/2015   Hyperlipidemia 03/01/2015   Pulmonary nodules 02/12/2015   External hemorrhoids 02/05/2015   Chronic  night sweats 01/08/2015   Peripheral neuropathy 05/22/2014   Headache 10/13/2013   Arthralgia 08/18/2013   ANA positive 07/29/2013   Depression with anxiety 02/05/2013   Malignant neoplasm of lower-outer quadrant of right breast of female, estrogen receptor positive 01/03/2013   Asthma, allergic 08/05/2012   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   GERD (gastroesophageal reflux disease) 06/03/2012   Stricture and stenosis of esophagus 10/19/2011   Constipation 07/21/2011   Osteoporosis 04/17/2011   Fibrocystic breast disease 10/18/2010   Essential hypertension 08/25/2010   CAD in native artery 08/25/2010   Generalized anxiety disorder 08/03/2010   Bronchitis 03/10/2010   Fibromyalgia 01/11/2010   PCP: Domenica Harlene LABOR, MD  REFERRING PROVIDER:   Barton Drape, MD (peroneal tendinitis)  Dina Camie BRAVO, PA-C  (Unstable gait)   Bonner Ade, MD (radiculopathy)    Mikel Leeroy Jenkins HERO, MD  (Pain in right elbow, pain in left elbow)   REFERRING DIAG:  M76.72 (ICD-10-CM) - Peroneal tendinitis, left leg   M54.16 (ICD-10-CM) - Radiculopathy, lumbar region    M25.521 (ICD-10-CM) - Pain in right elbow  M25.522 (ICD-10-CM) - Pain in left elbow   R26.81 (ICD-10-CM) - Unstable gait   THERAPY DIAG:  No diagnosis found.  Rationale for Evaluation and Treatment: Rehabilitation  ONSET DATE: 03/12/24  SUBJECTIVE:  SUBJECTIVE STATEMENT: Patient reports undergoing lumbar injections a week ago and has been feeling good since these injections. Continues to complete vestibular exercises for her dizziness that continues to fluctuate. The left ankle has not stopped hurting and has f/u with surgeon today, but feels that she can tolerate this pain. Continues to have kidney issues and the doctor believes it could still be a stone, but has not had further work up in the past few weeks. She has f/u with Dr. Bonner on Friday for her back. She has new referral for bilateral elbow pain. She reports  at least 18 months of bilateral elbow pain of insidious onset, but this has been on the back burner due to other issues. Overall the pain has been worsening in the elbows since onset. The elbows will stay swollen, are tender touch, and working in the kitchen (peeling,slicing) and carrying items will bother the elbows.   PERTINENT HISTORY: S/p Lt ankle arthroscopy Brostrom stabilization, peroneus brevis and longus debridement and tenosynovectomy 03/12/24 See extensive PMH above   PAIN:  Are you having pain? Yes: NPRS scale:  3 currently/10 Pain location:  low/mid back Pain description: pressure Aggravating factors: walking, standing,sitting Relieving factors: heat, lying flat   Are you having pain? Yes: NPRS scale: 4 Pain location: lateral aspect of left foot Pain description: sore Aggravating factors: pressure, WB Relieving factors: unsure   Are you having pain? Yes: NPRS scale: 6 Pain location: bilateral elbows  Pain description: hot, inflammed Aggravating factors: carrying, kitchen tasks, sensitive to touch Relieving factors: nothing  PRECAUTIONS: Fall,progress per protocol; osteoporosis   WEIGHT BEARING RESTRICTIONS: No  FALLS:  Has patient fallen in last 6 months? Yes. Number of falls 2 (fell stepping up grassy hill, fell in her kitchen from turning quickly)  LIVING ENVIRONMENT: Lives with: lives with their spouse Lives in: House/apartment Stairs: Yes: Internal: 16 steps; on left going up Has following equipment at home: Single point cane  PATIENT GOALS: I want to be able to walk again.   OBJECTIVE:  Note: Objective measures were completed at Evaluation unless otherwise noted.  DIAGNOSTIC FINDINGS: none on file   PATIENT SURVEYS:  FADI: 46/104; 44% 06/26/24: FADI: 67/100; 67%  08/11/24: modified oswestry 30/50; 60% disability   10/29/24: 48% disability modified oswestry   COGNITION: Overall cognitive status: Within functional limits for tasks  assessed     SENSATION: See palpation  EDEMA:  Mild swelling about Lt ankle   PALPATION: Hypersensitivity to palpation about distal fibula, lateral malleolus   LUMBAR ROM:  Active  A/PROM  eval  Flexion 50% limited LBP and RLE pain   Extension WNL LBP  Right lateral flexion 25% limited  Left lateral flexion 25% limited   Right rotation WNL  Left rotation WNL   (Blank rows = not tested)  LOWER EXTREMITY ROM: Active ROM Right eval Left eval 05/05/24 Left  05/20/24 Left  06/03/24 Left   Hip flexion       Hip extension       Hip abduction       Hip adduction       Hip internal rotation       Hip external rotation       Knee flexion       Knee extension       Ankle dorsiflexion  6 9  10   Ankle plantarflexion  50  65 67  Ankle inversion  15  15 17   Ankle eversion  8  8 9    (Blank rows = not tested)  LOWER EXTREMITY MMT: MMT Right eval Left eval 06/26/24 Left  08/04/24 Left  08/11/24   Hip flexion     Rt: 4; Lt: 4-  Hip extension       Hip abduction     4 bilateral   Hip adduction       Hip internal rotation       Hip external rotation       Knee flexion       Knee extension       Ankle dorsiflexion  3+ 4+ 4+   Ankle plantarflexion  3+ sitting 4- in sitting 4 in sitting    Ankle inversion  3+ 4- 4   Ankle eversion  3+ 4 5    (Blank rows = not tested) UPPER EXTREMITY MMT:  MMT Right 10/29/24 Left 10/29/24  Shoulder flexion    Shoulder extension    Shoulder abduction    Shoulder adduction    Shoulder extension    Shoulder internal rotation    Shoulder external rotation    Middle trapezius    Lower trapezius  Elbow flexion 4+ 4+  Elbow extension 5 4  Wrist flexion 5 5  Wrist extension 4- pain  4+  Wrist ulnar deviation    Wrist radial deviation    Wrist pronation 5 5  Wrist supination 4- 4-  Grip strength     (Blank rows = not tested)  FUNCTIONAL TESTS:  TUG: 13.5 seconds Romberg x 30 seconds Semi-tandem unable   06/09/24: TUG: 11.1 seconds    06/26/24: tandem 1 second   08/11/24: Rt rolling requires use of UE support on the hip to roll, increased pain   GAIT: Distance walked: 20 ft  Assistive device utilized: Single point cane. Ankle brace Level of assistance: Modified independence Comments: limited push-off LLE, foot ER,  [facility] Adult PT Treatment:                                                DATE: 10/29/24 Therapeutic Exercise: Reviewed and updated HEP discussing frequency, sets, reps.   Self Care: Pain neuroscience education and benefits of mindfulness/meditation Modalities for pain control including TENS, heat, etc.    [facility] Adult PT Treatment:                                                DATE: 10/08/24 Therapeutic Activity: Sit<>stand x 5 reps-- patient demonstrates without UE use Standing heel raises Standing toe raises Standing gastroc stretch *Performed these activities as part of general strength and mobility plan Discussed doing daily movement and then adding in VOR (for dizziness), foot towel scrunches (for foot pain), lumbar stabilization (for back pain) if those specific symptoms arise We discussed plan to hold therapy for 2 weeks while adjusting meds and recovering from recent trip-- then we will check HEP and progress difficulty if needed Discussed referral for elbows-- see assessment BP=104/65 Gait: Patient unsteady upon rising today and is mod indep with SPC and CGA without device for short distances in the clinic   [facility] Adult PT Treatment:                                                DATE: 09/24/24 Therapeutic Exercise: Back extensions attempted however pt could not complete  Neuromuscular re-ed: Adductor squeeze with ball between knees x20 Ball press squeeze for TA activation in seated x8  Self Care: Educated on use of TENS and how it can be used for self pain management at home Recommended  urology follow up    [facility] Adult PT Treatment:                                                 DATE: 09/22/24 PHYSICAL THERAPY RE-EVALUATION: Modified disability = 54% (was 60%) Palpation of L lateral ankle with pain that shoots along border of foot 5 time sit<>stand=22.37 seconds today Pain is present in low back during sit<>supine Discussed daily activities around her home and what is limited== patient notes feeling greater unsteadiness today-- this can vary; she also  has intermittent L foot pain and low back pain.  Therapeutic Exercise Toe extension alternating big toe and other 4 digits (toe yoga) Used a ball under the ball of her foot to add toe spreading as toe yoga activities-- unable today Neuromuscular re-ed: Tandem=1 second R and L-- difficulty dec'ing UE support today Standing narrow base of support is challenging today with patient reaching for hand held support Rolling R<>L without dizziness    [facility] Adult PT Treatment:                                                DATE: 09/17/24 Therapeutic Exercise: Standing lumbar extension 2 x 10  Reviewed HEP- discussing centralization of pain as it relates to repeated movement  Therapeutic Activity: Hip hinge at wall with foam roller x 10   Self Care: Discussed use of TENS unit for pain modulation with recommendation to bring in her TENS unit at next visit to discuss proper setup and application.  Reviewed bending/lifting mechanics as part of previously provided handout   PATIENT EDUCATION:  Education details:see treatment  Person educated: Patient Education method: Explanation, demo, cues, handout Education comprehension: verbalized understanding, returned demo, cues   HOME EXERCISE PROGRAM: Access Code: [email or web address] URL: [email or web address] Date: 10/29/2024 Prepared by: Lucie Meeter  Exercises - Sit to Stand  - 1 x daily - 3 x weekly - 1 sets - 10 reps - Toe Raises with Counter Support  - 1 x daily - 3 x weekly - 2 sets - 10 reps - Heel Raises with Counter Support  - 1 x daily - 3 x weekly - 2 sets -  10 reps - Standing Hip Abduction with Counter Support  - 1 x daily - 3 x weekly - 2 sets - 10 reps - Gastroc Stretch on Wall  - 1 x daily - 3 x weekly - 3 sets - 30 sec  hold - Towel Scrunches  - 1 x daily - 3 x weekly - 1 sets - 5 reps - Standing Elbow Extension with Self-Anchored Resistance  - 1 x daily - 3 x weekly - 2 sets - 10 reps - Standing Single Arm Elbow Flexion with Resistance  - 1 x daily - 3 x weekly - 2 sets - 10 reps - Standing Gaze Stabilization with Head Rotation  - 1 x daily - 7 x weekly - 2 sets - 10 reps - 5 Minute Guided Meditation  - 7 x weekly - 10 Minute Guided Meditation  - 7 x weekly  Patient Education - Treating Persistent Pain Without Medications: Mindfulness  ASSESSMENT:  CLINICAL IMPRESSION: Kristell presents with new referral to evaluate and treat bilateral elbow pain. She reports chronic, worsening elbow pain over the past 18 months of insidious onset. She reports difficulty with carrying and fine motor tasks secondary to pain. Re-evaluation was performed including elbow assessment and re-assessment of other current treatment diagnoses including low back pain, Lt ankle arthroscopy, and unstable gait. Upon assessment she is noted to have bilateral elbow and wrist weakness with elbow pain provoked with wrist extension MMT. She has met all established goals as they relate to the ankle and dizziness. She is making steady progress towards goals specific for her low back pain. She will benefit from continued skilled PT 1-2 weekly for an additional 6 weeks to progress her functional strength and mobility working  towards developing a comprehensive home program that she can continue independently to assist in management of her chronic conditions in order to optimize her function and assist in overall pain reduction.   EVAL: Patient is a 78 y.o. female who was seen today for physical therapy evaluation and treatment for s/p Lt ankle arthroscopy Brostrom stabilization, peroneus  brevis and longus debridement and tenosynovectomy on 03/12/24. She demonstrates ROM, strength, gait and balance deficits that are consistent with her recent post-operative status. She will benefit from skilled PT to address the above stated deficits in order to return to optimal function.   OBJECTIVE IMPAIRMENTS: Abnormal gait, decreased activity tolerance, decreased balance, decreased endurance, decreased knowledge of condition, decreased mobility, difficulty walking, decreased ROM, decreased strength, increased edema, impaired flexibility, impaired sensation, improper body mechanics, postural dysfunction, and pain.   GOALS: Goals reviewed with patient? Yes SHORT TERM GOALS: Target date: 11/19/2024 (updated 10/29/24)   Patient will demonstrate use of TENS unit for home to manage chronic low back pain. Baseline: to bring next visit 10/29/24: independent use of TENS unit as needed for pain  Goal status: MET  2.  Patient will improve 5 time sit<>stand to < or equal to 18 seconds.  Baseline: 22.53 seconds 10/29/24: 19.9 seconds  Goal status: PROGRESSING  LONG TERM GOALS: Target date: 12/13/24 (updated 10/29/24)   Patient will score >/= 60/104 on the Foot and Ankle Disability Index (MDC 7) to signify clinically meaningful improvement in functional abilities.  Baseline: see above Goal status: MET  2.  Patient will maintain tandem stance for at least 10 seconds to improve gait stability.  Baseline: see above 06/26/24: tandem 1 second 08/04/24: tandem 4 second 10/29/24: tandem 12 seconds  Goal status: MET   3.  Patient will be modified independent with curb/stair negotiation.  Baseline: uses cane and requires support from spouse  06/26/24: requires cane  Goal status: MET  4.  Patient will self-report ability to tolerate at least 30 minutes of standing/walking activity.  Baseline: 10 minutes  06/26/24: 20 minutes  08/04/24: limited due to back pain and dizziness, 10 minutes  10/29/24: 20-30 minutes   Goal status: MET  5.  Patient will demonstrate at least 4/5 Lt ankle strength to improve stability when navigating on uneven terrain.  Baseline: see above  Goal status: MET  6.  Patient will be independent with advanced home program to progress/maintain current level of function.  Baseline: initial HEP issued  Goal status: MET   7. Patient will tolerate rolling R and L without d/o dizziness.   Baseline: 4/10 dizzines 08/04/24: 4/10 dizziness baseline    Goal status: MET 09/22/24: baseline dizziness today is 1/10  (began again this weekend) Rolling is so minute I don't think it's dizziness   8. Patient will tolerate gaze x 1 viewing in sitting x 20 seconds to work towards improved tolerance to turns in standing.   Baseline: Unable to do 2 reps in sitting   08/04/24: VOR x 1, 20 seconds    Goal status: MET   9. Patient will score </= 40 % disability on the Oswestry Disability Index (MCID is 20%) to signify clinically meaningful improvement in functional abilities.    Baseline: see above   Goal status: PROGRESSING   10. Patient will demonstrate proper lifting mechanics to improve her tolerance to loading/unloading the dishwasher.    Baseline: increased pain, limits bending activity   10/29/24: continues to not lift at home due to pain.    Goal status: PROGRESSING  11. Patient will be indep with final HEP progression.  Baseline: has HEP will need progression Goal status: PROGRESSING   12. Patient will demonstrate at least 4+/5 bilateral elbow and wrist strength to improve ability to carry items.  Baseline: see above  Goal Status: NEW    PLAN:  PT FREQUENCY: 1-2x/week  PT DURATION: 6 weeks  PLANNED INTERVENTIONS: [id number]- PT Re-evaluation, 97750- Physical Performance Testing, 97110-Therapeutic exercises, 97530- Therapeutic activity, [id number]- Neuromuscular re-education, 97535- Self Care, [id number]- Manual therapy, [id number]- Gait training, [phone]- Aquatic Therapy, [phone]- Vasopneumatic  device, [phone] (1-2 muscles), [id number] (3+ muscles)- Dry Needling, Taping, Cryotherapy, and Moist heat  PLAN FOR NEXT SESSION: review and progress HEP; focus on generalized strengthening and working towards independent home program. Architectural technologist, functional strengthening and balance activity   Lucie Meeter, PT, DPT, ATC 11/05/24 7:39 AM

## 2024-11-11 ENCOUNTER — Encounter: Payer: Self-pay | Admitting: Professional

## 2024-11-11 ENCOUNTER — Ambulatory Visit (INDEPENDENT_AMBULATORY_CARE_PROVIDER_SITE_OTHER): Admitting: Professional

## 2024-11-11 DIAGNOSIS — F411 Generalized anxiety disorder: Secondary | ICD-10-CM

## 2024-11-11 DIAGNOSIS — F4310 Post-traumatic stress disorder, unspecified: Secondary | ICD-10-CM

## 2024-11-11 DIAGNOSIS — F331 Major depressive disorder, recurrent, moderate: Secondary | ICD-10-CM

## 2024-11-11 NOTE — Progress Notes (Signed)
 [facility] Counselor/Therapist Progress Note  Patient ID: [name], MRN: [id number],    Date: 11/11/2024  Time Spent: 48 minutes 10-1048am   Treatment Type: Individual Therapy  Risk Assessment: Danger to Self:  No Self-injurious Behavior: No Danger to Others: No  Subjective: This session was held via video teletherapy. The patient consented to video teletherapy and was located in her home during this session. She is aware it is the responsibility of the patient to secure confidentiality on her end of the session. The provider was in a private home office for the duration of this session.    The patient arrived on time for the [name] appointment.   Issues addressed: 1-find a book club to join -has not done yet 2-medication -shots in back did good for 7 days -has been impaired since that time -has had to get help to get off toilet -uses heating pad and can be okay -pt thinking about how thankful she is for having had 7 days pain-free -she will get another injection in two months due to Medicare regulations -the MD told her that she would notice decreased pain as she gets additional injections 3-children and money a-pt focused on amount of money she gives away monthly to her children/ granddaughter b-she has spent 144K giving money to her children -she admits she feels enlightened and has plans to do things differently 4-insurance changing -pt will have a copay for specialists of $25 and she is pleased and sees as manageable  2025 Treatment Plan Problems: Anxiety, Chronic Pain Symptoms: Excessive and/or unrealistic worry that is difficult to control occurring more days than not for at least 6 months about a number of events or activities. Hypervigilance (e.g., feeling constantly on edge, experiencing concentration difficulties, having trouble falling or staying asleep, exhibiting a general state of irritability). Experiences an increase in general  physical discomfort (e.g., fatigue, night sweats, insomnia, muscle tension, body aches). Experiences back or neck pain, interstitial cystitis, or diabetic neuropathy. Complains of generalized pain in many joints, muscles, and bones that debilitates normal functioning. Goals: Enhance ability to effectively cope with the full variety of life's worries and anxieties. Acquire and utilize the necessary pain management skills. Accept the chronic pain and move on with life as much as possible. Find an escape route from the pain. Objectives target date for all objectives is 10/22/2025: Engage in positive self-talk as an alternative to the depressing, negative thoughts about self and the world. Learn and implement specific coping skills as well as when and how to use them to manage pain and its consequences. Learn and implement calming skills such as relaxation, biofeedback, or mindfulness meditation to ease pain. Learn mental coping skills and implement with somatic skills for managing acute pain. Interventions: Assign the client to read about progressive muscle relaxation and other calming strategies in relevant books or treatment manuals (e.g., The Relaxation and Stress Reduction Workbook by Nicholaus Aw, and [place]; Living Beyond Your Pain by Doreene and [place]). Identify areas in the client's life where he/she can implement skills learned through relaxation or biofeedback. Teach the client distraction techniques (e.g., pleasant imagery, counting techniques, alternative focal point) and how to use them with relaxation skills for the management of acute episodes of pain (or assign Controlling the Focus on Physical Problems in the Adult Psychotherapy Homework Planner by Jenniffer). Teach the client specific coping skills based on an assessment of need (e.g., problem-solving, social/communication, conflict resolution, goal-setting). Assist the client in reframing thoughts about his/her life as  one  that has many positive elements outside of the pain; ask him/her to list positive aspects of himself/herself as well as his life circumstances (or assign Positive Self-Talk and/or What's Good about Me and My Life? in the Adult Psychotherapy Homework Planner by Jenniffer).  Diagnosis:Major depressive disorder, recurrent episode, moderate (HCC)  Generalized anxiety disorder  PTSD (post-traumatic stress disorder)  Plan:  -meet on Monday, November 17, 2024 at [employer].

## 2024-11-12 ENCOUNTER — Ambulatory Visit: Payer: Self-pay

## 2024-11-12 DIAGNOSIS — M6281 Muscle weakness (generalized): Secondary | ICD-10-CM

## 2024-11-12 DIAGNOSIS — M25521 Pain in right elbow: Secondary | ICD-10-CM

## 2024-11-12 NOTE — Therapy (Addendum)
 " OUTPATIENT PHYSICAL THERAPY TREATMENT   PHYSICAL THERAPY DISCHARGE SUMMARY  Visits from Start of Care: 30  Current functional level related to goals / functional outcomes: See goals below   Remaining deficits: Status unknown   Education / Equipment: N/A   Patient agrees to discharge. Patient goals were partially met. Patient is being discharged due to not returning since the last visit.   Patient Name: [name] MRN: [id number] DOB:16-Sep-1946, 78 y.o., female Today's Date: 11/12/2024  END OF SESSION:  PT End of Session - 11/12/24 1448     Visit Number 30    Number of Visits 41    Date for Recertification  12/13/24    Authorization Type [employer]    Progress Note Due on Visit 39    PT Start Time 1447    PT Stop Time 1529    PT Time Calculation (min) 42 min    Activity Tolerance Patient tolerated treatment well    Behavior During Therapy [facility] for tasks assessed/performed             Past Medical History:  Diagnosis Date   Abdominal pain 10/26/2013   Allergic rhinitis 01/25/2016   Allergy     ANA positive 07/29/2013   Anemia    Arthralgia 08/18/2013   Arthritis    Asthma, allergic 08/05/2012   Atrial flutter    past history- not current   Atypical chest pain 01/01/2015   Bilateral carpal tunnel syndrome 03/04/2015   CAD in native artery 08/25/2010   Minor CAD 2009, 2011, low risk Myoview  2013 and 2016        Cataract    bilaterally removed    Cellulitis of breast 02/05/2023   Chronic constipation    Chronic cough 01/09/2017   Chronic interstitial cystitis 02/01/2017   Chronic night sweats 01/08/2015   Common bile duct dilatation 02/13/2023   Concussion    x 3   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   DDD (degenerative disc disease), cervical 09/17/2018   Degenerative scoliosis 04/22/2018   Deviated septum 05/12/2016   Dysphagia    Dyspnea    Dysuria 03/09/2021   Elevated alkaline phosphatase level 01/25/2023   Essential hypertension  08/25/2010   External hemorrhoids 02/05/2015   Facial trauma 01/22/2023   Family history of adverse reaction to anesthesia    Family history of malignant hyperthermia    father had this   Fibrocystic breast disease 10/18/2010   Fibromyalgia    Frequency of urination    Generalized anxiety disorder 08/03/2010   GERD (gastroesophageal reflux disease)    Headache 10/13/2013   High serum vitamin D  05/18/2021   History of chronic bronchitis    History of colonic polyp 02/25/2019   History of hiatal hernia    History of tachycardia    CONTROLLED  WITH ATENOLOL    Hyperglycemia 03/08/2021   Hyperkalemia 09/28/2022   Hyperlipidemia    Intertrigo 12/13/2022   Kidney stone 02/26/2019   Knee pain, right 04/30/2015   Left hip pain 04/30/2015   Leg pain 02/24/2011   Qualifier: Diagnosis of   By: Antonio ROSALEA Rockers         Leukocytosis 07/05/2022   Local reaction to hymenoptera sting 05/09/2023   Low back pain 09/08/2020   Major depressive disorder 02/05/2013   Malignant neoplasm of lower-outer quadrant of right breast of female, estrogen receptor positive 01/03/2013   Nasal turbinate hypertrophy 05/12/2016   Oral herpes 02/05/2023   OSA and COPD overlap syndrome 06/10/2020  Osteoporosis 01/2019   T score -2.2 stable/improved from prior study   Pelvic pain    Peripheral neuropathy 05/22/2014   Personal history of radiation therapy    Pneumonia    Primary insomnia 06/25/2017   Pulmonary nodules 02/12/2015   Rash and other nonspecific skin eruption 05/09/2023   Recurrent falls 05/02/2020   Recurrent sinusitis 02/25/2019   Right arm pain 07/20/2016   RLS (restless legs syndrome) 11/09/2019   RUQ pain 07/05/2022   S/P radiation therapy 11/12/12 - 12/05/12   right Breast   Sciatica 04/30/2015   Sepsis 2014   from UTI    Sjogren's disease 12/12/2021   Splenic lesion 09/02/2012   Sprain of lateral ligament of ankle joint 07/31/2023   Stage 3 chronic kidney disease    Statin  intolerance 02/29/2020   Stricture and stenosis of esophagus 10/19/2011   Tongue lesion 02/05/2023   Tremor 07/05/2022   Urgency of urination    Vertigo 01/22/2023   Vitamin B12 deficiency 03/08/2021   Vocal cord cyst 05/18/2021   Past Surgical History:  Procedure Laterality Date   24 HOUR PH STUDY N/A 04/03/2016   Procedure: 24 HOUR PH STUDY;  Surgeon: Elspeth Deward Naval, MD;  Location: [facility];  Service: Gastroenterology;  Laterality: N/A;   ANKLE ARTHROSCOPY WITH RECONSTRUCTION Left 03/12/2024   Procedure: ANKLE ARTHROSCOPY WITH OPEN LATERAL ANKLE LIGAMENT STABILIZATION;  Surgeon: Barton Drape, MD;  Location: [facility];  Service: Orthopedics;  Laterality: Left;   BREAST BIOPSY Right 08/23/2012   ADH   BREAST BIOPSY Right 10/11/2012   Ductal Carcinoma   BREAST EXCISIONAL BIOPSY Right 09/04/2013   benign   BREAST LUMPECTOMY Right 10/11/2012   W/ SLN BX   CARDIAC CATHETERIZATION  09-13-2007  DR MORRIS   WELL-PRESERVED LVF/  DIFFUSE SCATTERED CORONARY CALCIFACATION AND ATHEROSCLEROSIS WITHOUT OBSTRUCTION   CARDIAC CATHETERIZATION  08-04-2010  DR MCALHANY   NON-OBSTRUCTIVE CAD/  pLAD 40%/  oLAD 30%/  mLAD 30%/  pRCA 30%/  EF 60%   CARDIOVASCULAR STRESS TEST  06-18-2012  DR McALHANY   LOW RISK NUCLEAR STUDY/  SMALL FIXED AREA OF MODERATELY DECREASED UPTAKE IN ANTEROSEPTAL WALL WHICH MAY BE ARTIFACTUAL/  NO ISCHEMIA/  EF 68%   COLONOSCOPY  09/29/2010   CYSTOSCOPY     CYSTOSCOPY WITH HYDRODISTENSION AND BIOPSY N/A 03/06/2014   Procedure: CYSTOSCOPY/HYDRODISTENSION/ INSTILATION OF MARCAINE  AND PYRIDIUM ;  Surgeon: Arlena LILLETTE Gal, MD;  Location: [facility];  Service: Urology;  Laterality: N/A;   CYSTOSCOPY/URETEROSCOPY/HOLMIUM LASER/STENT PLACEMENT Left 03/21/2023   Procedure: CYSTOSCOPY LEFT RETROGRADE PYELOGRAM, LEFT URETEROSCOPY, HOLMIUM LASER LITHOTRIPSYM, AND LEFT URETERAL STENT PLACEMENT;  Surgeon: Devere Lonni Righter, MD;   Location: [facility];  Service: Urology;  Laterality: Left;  60 MINUTES   ESOPHAGEAL MANOMETRY N/A 04/03/2016   Procedure: ESOPHAGEAL MANOMETRY (EM);  Surgeon: Elspeth Deward Naval, MD;  Location: [facility];  Service: Gastroenterology;  Laterality: N/A;   EXTRACORPOREAL SHOCK WAVE LITHOTRIPSY Left 02/06/2019   Procedure: EXTRACORPOREAL SHOCK WAVE LITHOTRIPSY (ESWL);  Surgeon: Ottelin, Mark, MD;  Location: [facility];  Service: Urology;  Laterality: Left;   NASAL SINUS SURGERY  1985   ORIF RIGHT ANKLE  FX  2006   POLYPECTOMY     REMOVAL VOCAL CORD CYST  01/2013   REPAIR OF PERONEUS BREVIS TENDON Left 03/12/2024   Procedure: REPAIR OF PERONEUS TENDON;  Surgeon: Barton Drape, MD;  Location: [facility];  Service: Orthopedics;  Laterality: Left;   RIGHT BREAST BX  08/23/2012  RIGHT HAND SURGERY  X3  LAST ONE 2009   INCLUDES  ORIF RIGHT 5TH FINGER AND REVISION TWICE   SKIN CANCER EXCISION     face,arms   TONSILLECTOMY AND ADENOIDECTOMY  AGE 64   TOTAL ABDOMINAL HYSTERECTOMY W/ BILATERAL SALPINGOOPHORECTOMY  1982   W/  APPENDECTOMY   TRANSTHORACIC ECHOCARDIOGRAM  06/24/2012   GRADE I DIASTOLIC DYSFUNCTION/  EF 55-60%/  MILD MR   UPPER GASTROINTESTINAL ENDOSCOPY     Patient Active Problem List   Diagnosis Date Noted   Chronic back pain 10/31/2024   Breast discharge 08/27/2024   Acute left ankle pain 08/14/2024   Pedal edema 05/20/2024   Otalgia of both ears 04/23/2024   Tinnitus of both ears 04/23/2024   IDA (iron deficiency anemia) 09/12/2023   Stage 3 chronic kidney disease    Right ankle pain 07/31/2023   Common bile duct dilatation 02/13/2023   Oral herpes 02/05/2023   Elevated alkaline phosphatase level 01/25/2023   Vertigo 01/22/2023   Intertrigo 12/13/2022   Anemia 09/28/2022   Tremor 07/05/2022   RUQ pain 07/05/2022   Leukocytosis 07/05/2022   Sjogren's disease 12/12/2021   Vocal cord cyst 05/18/2021   Vitamin D  deficiency 05/18/2021   Dysuria 03/09/2021    Vitamin B12 deficiency 03/08/2021   Hyperglycemia 03/08/2021   OSA and COPD overlap syndrome 06/10/2020   Recurrent falls 05/02/2020   Statin intolerance 02/29/2020   RLS (restless legs syndrome) 11/09/2019   Recurrent sinusitis 02/25/2019   History of colonic polyp 02/25/2019   DDD (degenerative disc disease), cervical 09/17/2018   Degenerative scoliosis 04/22/2018   Primary insomnia 06/25/2017   Chronic interstitial cystitis 02/01/2017   Deviated nasal septum 05/12/2016   Hypertrophy of nasal turbinates 05/12/2016   Dysphagia    Allergic rhinitis 01/25/2016   Dyspnea    Sciatica 04/30/2015   Bilateral carpal tunnel syndrome 03/04/2015   Hyperlipidemia 03/01/2015   Pulmonary nodules 02/12/2015   External hemorrhoids 02/05/2015   Chronic night sweats 01/08/2015   Peripheral neuropathy 05/22/2014   Headache 10/13/2013   Arthralgia 08/18/2013   ANA positive 07/29/2013   Depression with anxiety 02/05/2013   Malignant neoplasm of lower-outer quadrant of right breast of female, estrogen receptor positive 01/03/2013   Asthma, allergic 08/05/2012   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   GERD (gastroesophageal reflux disease) 06/03/2012   Stricture and stenosis of esophagus 10/19/2011   Constipation 07/21/2011   Osteoporosis 04/17/2011   Fibrocystic breast disease 10/18/2010   Essential hypertension 08/25/2010   CAD in native artery 08/25/2010   Generalized anxiety disorder 08/03/2010   Bronchitis 03/10/2010   Fibromyalgia 01/11/2010   PCP: Domenica Harlene LABOR, MD  REFERRING PROVIDER:   Barton Drape, MD (peroneal tendinitis)  Dina Camie BRAVO, PA-C  (Unstable gait)   Bonner Ade, MD (radiculopathy)    Mikel Leeroy Jenkins HERO, MD  (Pain in right elbow, pain in left elbow)   REFERRING DIAG:  M76.72 (ICD-10-CM) - Peroneal tendinitis, left leg   M54.16 (ICD-10-CM) - Radiculopathy, lumbar region    M25.521 (ICD-10-CM) - Pain in right elbow  M25.522 (ICD-10-CM) - Pain  in left elbow   R26.81 (ICD-10-CM) - Unstable gait   THERAPY DIAG:  Muscle weakness (generalized)  Pain of both elbows  Rationale for Evaluation and Treatment: Rehabilitation  ONSET DATE: 03/12/24  SUBJECTIVE:  SUBJECTIVE STATEMENT: Patient reports she has been dealing with GI issues since last session (cramping, constipation, diarrhea, vomiting). She saw her GI nurse practitioner who recommended soft diet and will f/u  in 2 weeks. Symptoms are improving since onset and she has been able to eat more. My elbows are killing me. The back is not good. The foot is still not right.   PERTINENT HISTORY: S/p Lt ankle arthroscopy Brostrom stabilization, peroneus brevis and longus debridement and tenosynovectomy 03/12/24 See extensive PMH above   PAIN:  Are you having pain? Yes: NPRS scale:  6 currently/10 Pain location:  low/mid back Pain description: pressure Aggravating factors: walking, standing,sitting Relieving factors: heat, lying flat   Are you having pain? Yes: NPRS scale: 3 Pain location: lateral aspect of left foot Pain description: sore Aggravating factors: pressure, WB Relieving factors: unsure   Are you having pain? Yes: NPRS scale: 6 Pain location: bilateral elbows  Pain description: hot, inflammed Aggravating factors: carrying, kitchen tasks, sensitive to touch Relieving factors: nothing  PRECAUTIONS: Fall,progress per protocol; osteoporosis   WEIGHT BEARING RESTRICTIONS: No  FALLS:  Has patient fallen in last 6 months? Yes. Number of falls 2 (fell stepping up grassy hill, fell in her kitchen from turning quickly)  LIVING ENVIRONMENT: Lives with: lives with their spouse Lives in: House/apartment Stairs: Yes: Internal: 16 steps; on left going up Has following equipment at home: Single point cane  PATIENT GOALS: I want to be able to walk again.   OBJECTIVE:  Note: Objective measures were completed at Evaluation unless otherwise noted.  DIAGNOSTIC  FINDINGS: none on file   PATIENT SURVEYS:  FADI: 46/104; 44% 06/26/24: FADI: 67/100; 67%  08/11/24: modified oswestry 30/50; 60% disability   10/29/24: 48% disability modified oswestry   COGNITION: Overall cognitive status: Within functional limits for tasks assessed     SENSATION: See palpation  EDEMA:  Mild swelling about Lt ankle   PALPATION: Hypersensitivity to palpation about distal fibula, lateral malleolus   LUMBAR ROM:  Active  A/PROM  eval  Flexion 50% limited LBP and RLE pain   Extension WNL LBP  Right lateral flexion 25% limited  Left lateral flexion 25% limited   Right rotation WNL  Left rotation WNL   (Blank rows = not tested)  LOWER EXTREMITY ROM: Active ROM Right eval Left eval 05/05/24 Left  05/20/24 Left  06/03/24 Left   Hip flexion       Hip extension       Hip abduction       Hip adduction       Hip internal rotation       Hip external rotation       Knee flexion       Knee extension       Ankle dorsiflexion  6 9  10   Ankle plantarflexion  50  65 67  Ankle inversion  15  15 17   Ankle eversion  8  8 9    (Blank rows = not tested)  LOWER EXTREMITY MMT: MMT Right eval Left eval 06/26/24 Left  08/04/24 Left  08/11/24   Hip flexion     Rt: 4; Lt: 4-  Hip extension       Hip abduction     4 bilateral   Hip adduction       Hip internal rotation       Hip external rotation       Knee flexion       Knee extension       Ankle dorsiflexion  3+ 4+ 4+   Ankle plantarflexion  3+ sitting 4- in sitting 4 in sitting    Ankle inversion  3+ 4- 4  Ankle eversion  3+ 4 5    (Blank rows = not tested) UPPER EXTREMITY MMT:  MMT Right 10/29/24 Left 10/29/24  Shoulder flexion    Shoulder extension    Shoulder abduction    Shoulder adduction    Shoulder extension    Shoulder internal rotation    Shoulder external rotation    Middle trapezius    Lower trapezius    Elbow flexion 4+ 4+  Elbow extension 5 4  Wrist flexion 5 5  Wrist extension 4- pain   4+  Wrist ulnar deviation    Wrist radial deviation    Wrist pronation 5 5  Wrist supination 4- 4-  Grip strength     (Blank rows = not tested)  FUNCTIONAL TESTS:  TUG: 13.5 seconds Romberg x 30 seconds Semi-tandem unable   06/09/24: TUG: 11.1 seconds   06/26/24: tandem 1 second   08/11/24: Rt rolling requires use of UE support on the hip to roll, increased pain   GAIT: Distance walked: 20 ft  Assistive device utilized: Single point cane. Ankle brace Level of assistance: Modified independence Comments: limited push-off LLE, foot ER,  [facility] Adult PT Treatment:                                                DATE: 11/12/24 Therapeutic Exercise: HEP review/update Manual Therapy: Gentle STM to bilateral forearm flexor mass  Therapeutic Activity: Resisted wrist extension 2 x 10 @ 1 lb Putty gripping x 10 each  Resisted wrist flexion 2 x 10 @ 1 lb  Self Care: Ice for pain control to elbows (no more than 5 minutes)   [facility] Adult PT Treatment:                                                DATE: 10/29/24 Therapeutic Exercise: Reviewed and updated HEP discussing frequency, sets, reps.   Self Care: Pain neuroscience education and benefits of mindfulness/meditation Modalities for pain control including TENS, heat, etc.    [facility] Adult PT Treatment:                                                DATE: 10/08/24 Therapeutic Activity: Sit<>stand x 5 reps-- patient demonstrates without UE use Standing heel raises Standing toe raises Standing gastroc stretch *Performed these activities as part of general strength and mobility plan Discussed doing daily movement and then adding in VOR (for dizziness), foot towel scrunches (for foot pain), lumbar stabilization (for back pain) if those specific symptoms arise We discussed plan to hold therapy for 2 weeks while adjusting meds and recovering from recent trip-- then we will check HEP and progress difficulty if needed Discussed referral for  elbows-- see assessment BP=104/65 Gait: Patient unsteady upon rising today and is mod indep with SPC and CGA without device for short distances in the clinic   [facility] Adult PT Treatment:  DATE: 09/24/24 Therapeutic Exercise: Back extensions attempted however pt could not complete  Neuromuscular re-ed: Adductor squeeze with ball between knees x20 Ball press squeeze for TA activation in seated x8  Self Care: Educated on use of TENS and how it can be used for self pain management at home Recommended  urology follow up    [facility] Adult PT Treatment:                                                DATE: 09/22/24 PHYSICAL THERAPY RE-EVALUATION: Modified disability = 54% (was 60%) Palpation of L lateral ankle with pain that shoots along border of foot 5 time sit<>stand=22.37 seconds today Pain is present in low back during sit<>supine Discussed daily activities around her home and what is limited== patient notes feeling greater unsteadiness today-- this can vary; she also has intermittent L foot pain and low back pain.  Therapeutic Exercise Toe extension alternating big toe and other 4 digits (toe yoga) Used a ball under the ball of her foot to add toe spreading as toe yoga activities-- unable today Neuromuscular re-ed: Tandem=1 second R and L-- difficulty dec'ing UE support today Standing narrow base of support is challenging today with patient reaching for hand held support Rolling R<>L without dizziness    [facility] Adult PT Treatment:                                                DATE: 09/17/24 Therapeutic Exercise: Standing lumbar extension 2 x 10  Reviewed HEP- discussing centralization of pain as it relates to repeated movement  Therapeutic Activity: Hip hinge at wall with foam roller x 10   Self Care: Discussed use of TENS unit for pain modulation with recommendation to bring in her TENS unit at next visit to discuss proper setup and  application.  Reviewed bending/lifting mechanics as part of previously provided handout   PATIENT EDUCATION:  Education details:see treatment  Person educated: Patient Education method: Explanation, demo, cues, handout Education comprehension: verbalized understanding, returned demo, cues   HOME EXERCISE PROGRAM: Access Code: [email or web address] URL: [email or web address] Date: 11/12/2024 Prepared by: Lucie Meeter  Exercises - Sit to Stand  - 1 x daily - 3 x weekly - 1 sets - 10 reps - Toe Raises with Counter Support  - 1 x daily - 3 x weekly - 2 sets - 10 reps - Heel Raises with Counter Support  - 1 x daily - 3 x weekly - 2 sets - 10 reps - Standing Hip Abduction with Counter Support  - 1 x daily - 3 x weekly - 2 sets - 10 reps - Gastroc Stretch on Wall  - 1 x daily - 3 x weekly - 3 sets - 30 sec  hold - Towel Scrunches  - 1 x daily - 3 x weekly - 1 sets - 5 reps - Standing Elbow Extension with Self-Anchored Resistance  - 1 x daily - 3 x weekly - 2 sets - 10 reps - Standing Single Arm Elbow Flexion with Resistance  - 1 x daily - 3 x weekly - 2 sets - 10 reps - Putty Squeezes  - 1 x daily - 3 x weekly - 2 sets - 10 reps -  Standing Gaze Stabilization with Head Rotation  - 1 x daily - 7 x weekly - 2 sets - 10 reps - 5 Minute Guided Meditation  - 7 x weekly - 10 Minute Guided Meditation  - 7 x weekly - 15 Minute Guided Meditation  - 7 x weekly - 20 Minute Guided Meditation  - 7 x weekly  Patient Education - Treating Persistent Pain Without Medications: Mindfulness  ASSESSMENT:  CLINICAL IMPRESSION: Patient arrives with continued complaints of elbow, low back, and Lt ankle/foot pain. She also reports GI issues since last session that are being addressed by her GI specialist. We focused on addressing elbow pain today with manual work and gentle strengthening. She reported no elbow pain at conclusion of session.   EVAL: Patient is a 78 y.o. female who was seen today for physical  therapy evaluation and treatment for s/p Lt ankle arthroscopy Brostrom stabilization, peroneus brevis and longus debridement and tenosynovectomy on 03/12/24. She demonstrates ROM, strength, gait and balance deficits that are consistent with her recent post-operative status. She will benefit from skilled PT to address the above stated deficits in order to return to optimal function.   OBJECTIVE IMPAIRMENTS: Abnormal gait, decreased activity tolerance, decreased balance, decreased endurance, decreased knowledge of condition, decreased mobility, difficulty walking, decreased ROM, decreased strength, increased edema, impaired flexibility, impaired sensation, improper body mechanics, postural dysfunction, and pain.   GOALS: Goals reviewed with patient? Yes SHORT TERM GOALS: Target date: 11/19/2024 (updated 10/29/24)   Patient will demonstrate use of TENS unit for home to manage chronic low back pain. Baseline: to bring next visit 10/29/24: independent use of TENS unit as needed for pain  Goal status: MET  2.  Patient will improve 5 time sit<>stand to < or equal to 18 seconds.  Baseline: 22.53 seconds 10/29/24: 19.9 seconds  Goal status: PROGRESSING  LONG TERM GOALS: Target date: 12/13/24 (updated 10/29/24)   Patient will score >/= 60/104 on the Foot and Ankle Disability Index (MDC 7) to signify clinically meaningful improvement in functional abilities.  Baseline: see above Goal status: MET  2.  Patient will maintain tandem stance for at least 10 seconds to improve gait stability.  Baseline: see above 06/26/24: tandem 1 second 08/04/24: tandem 4 second 10/29/24: tandem 12 seconds  Goal status: MET   3.  Patient will be modified independent with curb/stair negotiation.  Baseline: uses cane and requires support from spouse  06/26/24: requires cane  Goal status: MET  4.  Patient will self-report ability to tolerate at least 30 minutes of standing/walking activity.  Baseline: 10 minutes  06/26/24: 20  minutes  08/04/24: limited due to back pain and dizziness, 10 minutes  10/29/24: 20-30 minutes  Goal status: MET  5.  Patient will demonstrate at least 4/5 Lt ankle strength to improve stability when navigating on uneven terrain.  Baseline: see above  Goal status: MET  6.  Patient will be independent with advanced home program to progress/maintain current level of function.  Baseline: initial HEP issued  Goal status: MET   7. Patient will tolerate rolling R and L without d/o dizziness.   Baseline: 4/10 dizzines 08/04/24: 4/10 dizziness baseline    Goal status: MET 09/22/24: baseline dizziness today is 1/10  (began again this weekend) Rolling is so minute I don't think it's dizziness   8. Patient will tolerate gaze x 1 viewing in sitting x 20 seconds to work towards improved tolerance to turns in standing.   Baseline: Unable to do 2 reps in  sitting   08/04/24: VOR x 1, 20 seconds    Goal status: MET   9. Patient will score </= 40 % disability on the Oswestry Disability Index (MCID is 20%) to signify clinically meaningful improvement in functional abilities.    Baseline: see above   Goal status: PROGRESSING   10. Patient will demonstrate proper lifting mechanics to improve her tolerance to loading/unloading the dishwasher.    Baseline: increased pain, limits bending activity   10/29/24: continues to not lift at home due to pain.    Goal status: PROGRESSING  11. Patient will be indep with final HEP progression.  Baseline: has HEP will need progression Goal status: PROGRESSING   12. Patient will demonstrate at least 4+/5 bilateral elbow and wrist strength to improve ability to carry items.  Baseline: see above  Goal Status: NEW    PLAN:  PT FREQUENCY: 1-2x/week  PT DURATION: 6 weeks  PLANNED INTERVENTIONS: 97164- PT Re-evaluation, 97750- Physical Performance Testing, 97110-Therapeutic exercises, 97530- Therapeutic activity, [id number]- Neuromuscular re-education, 97535- Self Care,  97140- Manual therapy, [id number]- Gait training, [id number]- Aquatic Therapy, 97016- Vasopneumatic device, [phone] (1-2 muscles), [id number] (3+ muscles)- Dry Needling, Taping, Cryotherapy, and Moist heat    Lucie Meeter, PT, DPT, ATC 11/12/24 3:31 PM  Lucie Meeter, PT, DPT, ATC 01/27/25 12:43 PM   "

## 2024-11-14 ENCOUNTER — Ambulatory Visit: Attending: Internal Medicine | Admitting: Physician Assistant

## 2024-11-14 DIAGNOSIS — Z01810 Encounter for preprocedural cardiovascular examination: Secondary | ICD-10-CM | POA: Diagnosis not present

## 2024-11-14 NOTE — Progress Notes (Signed)
 Virtual Visit via Telephone Note   Because of [name] co-morbid illnesses, she is at least at moderate risk for complications without adequate follow up.  This format is felt to be most appropriate for this patient at this time.  Due to technical limitations with video connection web designer), today's appointment will be conducted as an audio only telehealth visit, and [name] verbally agreed to proceed in this manner.   All issues noted in this document were discussed and addressed.  No physical exam could be performed with this format.  Evaluation Performed:  Preoperative cardiovascular risk assessment _____________   Date:  11/14/2024   Patient ID:  [name], DOB 06-27-1946, MRN [id number] Patient Location:  Home Provider location:   Office  Primary Care Provider:  Domenica Harlene LABOR, MD Primary Cardiologist:  Redell Shallow, MD  Chief Complaint / Patient Profile   78 y.o. y/o female with a h/o coronary artery disease, atypical chest pain, COPD, essential hypertension, hyperlipidemia, OSA who is pending right carpal tunnel release and presents today for telephonic preoperative cardiovascular risk assessment.  History of Present Illness    [name] is a 78 y.o. female who presents via audio/video conferencing for a telehealth visit today.  Pt was last seen in cardiology clinic on 07/23/2024 by Dr. Shallow.  At that time LYNLEY KILLILEA was doing well other than some lower extremity edema.  The patient is now pending procedure as outlined above. Since her last visit, she does feel a little short of breath in the morning but she is currently sick with a URI. No chest pain. She climbs stairs several times a day. She does have a yard service that helps upkeep her yard. She loves spending outside and plant flowers.   Ideally aspirin  should be continued without interruption, however if the bleeding risk is too great, aspirin  may be held for 5-7 days prior to surgery.  Please resume aspirin  post operatively when it is felt to be safe from a bleeding standpoint.   Past Medical History    Past Medical History:  Diagnosis Date   Abdominal pain 10/26/2013   Allergic rhinitis 01/25/2016   Allergy     ANA positive 07/29/2013   Anemia    Arthralgia 08/18/2013   Arthritis    Asthma, allergic 08/05/2012   Atrial flutter    past history- not current   Atypical chest pain 01/01/2015   Bilateral carpal tunnel syndrome 03/04/2015   CAD in native artery 08/25/2010   Minor CAD 2009, 2011, low risk Myoview  2013 and 2016        Cataract    bilaterally removed    Cellulitis of breast 02/05/2023   Chronic constipation    Chronic cough 01/09/2017   Chronic interstitial cystitis 02/01/2017   Chronic night sweats 01/08/2015   Common bile duct dilatation 02/13/2023   Concussion    x 3   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   DDD (degenerative disc disease), cervical 09/17/2018   Degenerative scoliosis 04/22/2018   Deviated septum 05/12/2016   Dysphagia    Dyspnea    Dysuria 03/09/2021   Elevated alkaline phosphatase level 01/25/2023   Essential hypertension 08/25/2010   External hemorrhoids 02/05/2015   Facial trauma 01/22/2023   Family history of adverse reaction to anesthesia    Family history of malignant hyperthermia    father had this   Fibrocystic breast disease 10/18/2010   Fibromyalgia    Frequency of urination    Generalized anxiety  disorder 08/03/2010   GERD (gastroesophageal reflux disease)    Headache 10/13/2013   High serum vitamin D  05/18/2021   History of chronic bronchitis    History of colonic polyp 02/25/2019   History of hiatal hernia    History of tachycardia    CONTROLLED  WITH ATENOLOL    Hyperglycemia 03/08/2021   Hyperkalemia 09/28/2022   Hyperlipidemia    Intertrigo 12/13/2022   Kidney stone 02/26/2019   Knee pain, right 04/30/2015   Left hip pain 04/30/2015   Leg pain 02/24/2011   Qualifier: Diagnosis of   By: Antonio DOJamee         Leukocytosis 07/05/2022   Local reaction to hymenoptera sting 05/09/2023   Low back pain 09/08/2020   Major depressive disorder 02/05/2013   Malignant neoplasm of lower-outer quadrant of right breast of female, estrogen receptor positive 01/03/2013   Nasal turbinate hypertrophy 05/12/2016   Oral herpes 02/05/2023   OSA and COPD overlap syndrome 06/10/2020   Osteoporosis 01/2019   T score -2.2 stable/improved from prior study   Pelvic pain    Peripheral neuropathy 05/22/2014   Personal history of radiation therapy    Pneumonia    Primary insomnia 06/25/2017   Pulmonary nodules 02/12/2015   Rash and other nonspecific skin eruption 05/09/2023   Recurrent falls 05/02/2020   Recurrent sinusitis 02/25/2019   Right arm pain 07/20/2016   RLS (restless legs syndrome) 11/09/2019   RUQ pain 07/05/2022   S/P radiation therapy 11/12/12 - 12/05/12   right Breast   Sciatica 04/30/2015   Sepsis 2014   from UTI    Sjogren's disease 12/12/2021   Splenic lesion 09/02/2012   Sprain of lateral ligament of ankle joint 07/31/2023   Stage 3 chronic kidney disease    Statin intolerance 02/29/2020   Stricture and stenosis of esophagus 10/19/2011   Tongue lesion 02/05/2023   Tremor 07/05/2022   Urgency of urination    Vertigo 01/22/2023   Vitamin B12 deficiency 03/08/2021   Vocal cord cyst 05/18/2021   Past Surgical History:  Procedure Laterality Date   24 HOUR PH STUDY N/A 04/03/2016   Procedure: 24 HOUR PH STUDY;  Surgeon: Elspeth Deward Naval, MD;  Location: [facility];  Service: Gastroenterology;  Laterality: N/A;   ANKLE ARTHROSCOPY WITH RECONSTRUCTION Left 03/12/2024   Procedure: ANKLE ARTHROSCOPY WITH OPEN LATERAL ANKLE LIGAMENT STABILIZATION;  Surgeon: Barton Drape, MD;  Location: [facility];  Service: Orthopedics;  Laterality: Left;   BREAST BIOPSY Right 08/23/2012   ADH   BREAST BIOPSY Right 10/11/2012   Ductal Carcinoma   BREAST EXCISIONAL  BIOPSY Right 09/04/2013   benign   BREAST LUMPECTOMY Right 10/11/2012   W/ [facility] BX   CARDIAC CATHETERIZATION  09-13-2007  DR MORRIS   WELL-PRESERVED LVF/  DIFFUSE SCATTERED CORONARY CALCIFACATION AND ATHEROSCLEROSIS WITHOUT OBSTRUCTION   CARDIAC CATHETERIZATION  08-04-2010  DR MCALHANY   NON-OBSTRUCTIVE CAD/  pLAD 40%/  oLAD 30%/  mLAD 30%/  pRCA 30%/  EF 60%   CARDIOVASCULAR STRESS TEST  06-18-2012  DR McALHANY   LOW RISK NUCLEAR STUDY/  SMALL FIXED AREA OF MODERATELY DECREASED UPTAKE IN ANTEROSEPTAL WALL WHICH MAY BE ARTIFACTUAL/  NO ISCHEMIA/  EF 68%   COLONOSCOPY  09/29/2010   CYSTOSCOPY     CYSTOSCOPY WITH HYDRODISTENSION AND BIOPSY N/A 03/06/2014   Procedure: CYSTOSCOPY/HYDRODISTENSION/ INSTILATION OF MARCAINE  AND PYRIDIUM ;  Surgeon: Arlena LILLETTE Gal, MD;  Location: [facility];  Service: Urology;  Laterality: N/A;   CYSTOSCOPY/URETEROSCOPY/HOLMIUM  LASER/STENT PLACEMENT Left 03/21/2023   Procedure: CYSTOSCOPY LEFT RETROGRADE PYELOGRAM, LEFT URETEROSCOPY, HOLMIUM LASER LITHOTRIPSYM, AND LEFT URETERAL STENT PLACEMENT;  Surgeon: Devere Lonni Righter, MD;  Location: [facility];  Service: Urology;  Laterality: Left;  60 MINUTES   ESOPHAGEAL MANOMETRY N/A 04/03/2016   Procedure: ESOPHAGEAL MANOMETRY (EM);  Surgeon: Elspeth Deward Naval, MD;  Location: [facility];  Service: Gastroenterology;  Laterality: N/A;   EXTRACORPOREAL SHOCK WAVE LITHOTRIPSY Left 02/06/2019   Procedure: EXTRACORPOREAL SHOCK WAVE LITHOTRIPSY (ESWL);  Surgeon: Ottelin, Mark, MD;  Location: [facility];  Service: Urology;  Laterality: Left;   NASAL SINUS SURGERY  1985   ORIF RIGHT ANKLE  FX  2006   POLYPECTOMY     REMOVAL VOCAL CORD CYST  01/2013   REPAIR OF PERONEUS BREVIS TENDON Left 03/12/2024   Procedure: REPAIR OF PERONEUS TENDON;  Surgeon: Barton Drape, MD;  Location: [facility];  Service: Orthopedics;  Laterality: Left;   RIGHT BREAST BX  08/23/2012   RIGHT HAND SURGERY  X3   LAST ONE 2009   INCLUDES  ORIF RIGHT 5TH FINGER AND REVISION TWICE   SKIN CANCER EXCISION     face,arms   TONSILLECTOMY AND ADENOIDECTOMY  AGE 15   TOTAL ABDOMINAL HYSTERECTOMY W/ BILATERAL SALPINGOOPHORECTOMY  1982   W/  APPENDECTOMY   TRANSTHORACIC ECHOCARDIOGRAM  06/24/2012   GRADE I DIASTOLIC DYSFUNCTION/  EF 55-60%/  MILD MR   UPPER GASTROINTESTINAL ENDOSCOPY      Allergies  Allergies  Allergen Reactions   Clindamycin Hcl Shortness Of Breath and Rash   Levofloxacin  Rash    Other reaction(s): sick, rash hives severe itching   Other Other (See Comments)    Father had malignant hyperthermia   Penicillins Anaphylaxis   Rosuvastatin  Anaphylaxis   Baclofen  Rash   Codeine Hives and Nausea Only   Erythromycin Hives   Haemophilus Influenzae Vaccines Other (See Comments)    Local reaction at site   Latex Hives   Lincomycin Other (See Comments)   Pentazocine Lactate Other (See Comments)    HALLUCINATION   Pneumococcal Vaccine Polyvalent Hives, Swelling and Other (See Comments)    REACTION: swelling at injection site   Prednisone  Rash    Other reaction(s): Rash, Hives - can tolerate if she takes with benadryl     Tamoxifen  Nausea And Vomiting   Selenium    Seroquel  [Quetiapine ] Dermatitis   Doxycycline  Rash   Fluzone [Influenza Virus Vaccine] Hives    Local reaction at the site   Haemophilus Influenzae Other (See Comments)    Local reaction at the site     Home Medications    Prior to Admission medications   Medication Sig Start Date End Date Taking? Authorizing Provider  acetaminophen  (TYLENOL ) 500 MG tablet Take 1,000 mg by mouth every 6 (six) hours as needed for mild pain or headache.     [provider]  albuterol  (VENTOLIN  HFA) 108 (90 Base) MCG/ACT inhaler Inhale 2 puffs into the lungs every 4 (four) hours as needed for wheezing or shortness of breath (coughing fits). 06/23/24   Domenica Harlene LABOR, MD  amLODipine  (NORVASC ) 5 MG tablet Take 1 tablet by mouth once  daily 04/23/24   Court Dorn PARAS, MD  aspirin  EC 81 MG tablet Take 1 tablet (81 mg total) by mouth daily. 05/28/14   Verlin Lonni BIRCH, MD  atenolol  (TENORMIN ) 25 MG tablet Take 1 tablet (25 mg total) by mouth 2 (two) times daily. 06/23/24   Domenica Harlene LABOR, MD  carboxymethylcellul-glycerin (  OPTIVE) 0.5-0.9 % ophthalmic solution Place 1 drop into both eyes daily as needed for dry eyes.    [provider]  cyanocobalamin  (VITAMIN B12) 1000 MCG tablet Take 1,000 mg by mouth daily with breakfast.    [provider]  diclofenac Sodium (VOLTAREN) 1 % GEL Apply 2 g topically daily as needed (Back pain). 02/25/15   [provider]  DULoxetine  (CYMBALTA ) 60 MG capsule Take 1 capsule by mouth twice daily 10/06/24   Domenica Harlene LABOR, MD  EPINEPHrine  0.3 mg/0.3 mL IJ SOAJ injection Inject 0.3 mg into the muscle as needed for anaphylaxis. 05/08/23   Luke Orlan HERO, DO  fluticasone  (FLONASE ) 50 MCG/ACT nasal spray Place 2 sprays into both nostrils daily. 04/28/24   Domenica Harlene LABOR, MD  fluticasone  (FLOVENT  HFA) 220 MCG/ACT inhaler Inhale 1-2 puffs into the lungs in the morning and at bedtime. 04/21/24   Domenica Harlene LABOR, MD  folic acid  (FOLVITE ) 1 MG tablet Take 1 tablet (1 mg total) by mouth daily. 05/03/23   O'Sullivan, Melissa, NP  furosemide  (LASIX ) 20 MG tablet TAKE 1 TABLET BY MOUTH AS NEEDED FOR EDEMA Patient taking differently: Take 20 mg by mouth. Once every 3 days 10/29/23   Pietro Redell RAMAN, MD  gabapentin  (NEURONTIN ) 300 MG capsule Take 2 capsules (600 mg total) by mouth in the morning AND 1 capsule (300 mg total) every evening AND 3 capsules (900 mg total) at bedtime. 10/24/24   Domenica Harlene LABOR, MD  hydrOXYzine  (ATARAX ) 10 MG tablet Take 0.5-2 tablets (5-20 mg total) by mouth 3 (three) times daily as needed for anxiety (not with Loratadine ). 11/27/23   Domenica Harlene LABOR, MD  linaclotide  (LINZESS ) 145 MCG CAPS capsule Take 1 capsule (145 mcg total) by mouth daily before breakfast. 06/26/24    Armbruster, Elspeth SQUIBB, MD  loratadine  (CLARITIN ) 10 MG tablet Take 10 mg by mouth daily as needed for rhinitis.    [provider]  nitroGLYCERIN  (NITROSTAT ) 0.4 MG SL tablet Place 1 tablet (0.4 mg total) under the tongue every 5 (five) minutes x 3 doses as needed for chest pain. 10/29/23   Domenica Harlene LABOR, MD  nystatin  cream (MYCOSTATIN ) Apply 1 application topically 2 (two) times daily. 05/17/21   Domenica Harlene LABOR, MD  pantoprazole  (PROTONIX ) 40 MG tablet Take 1 tablet by mouth 2 (two) times daily. 06/02/24   [provider]  polyethylene glycol (MIRALAX ) 17 g packet Take 17 g by mouth 2 (two) times daily. 05/22/23   Armbruster, Elspeth SQUIBB, MD  REPATHA  SURECLICK 140 MG/ML SOAJ INJECT CONTENTS OF 1 PEN SUBCUTANEOUSLY EVERY 14 DAYS 10/02/24   Domenica Harlene LABOR, MD  RSV vaccine recomb adjuvanted (AREXVY ) 120 MCG/0.5ML injection Inject into the muscle. 10/20/24   Luiz Channel, MD  sucralfate  (CARAFATE ) 1 GM/10ML suspension Take 10 mLs (1 g total) by mouth 4 (four) times daily -  with meals and at bedtime. Patient not taking: Reported on 10/13/2024 08/21/22   Antonio Meth, Yvonne R, DO  Tiotropium Bromide-Olodaterol (STIOLTO RESPIMAT ) 2.5-2.5 MCG/ACT AERS Inhale 2 puffs into the lungs daily. 10/13/24   Parrett, Madelin RAMAN, NP  Tiotropium Bromide-Olodaterol (STIOLTO RESPIMAT ) 2.5-2.5 MCG/ACT AERS 1 sample 10/13/24   Parrett, Tammy S, NP  traMADol  (ULTRAM ) 50 MG tablet Take 1 tablet by mouth 3 (three) times daily as needed. 09/13/23   [provider]  traZODone  (DESYREL ) 100 MG tablet Take 1 tablet (100 mg total) by mouth at bedtime. 10/31/24   Wheeler Harlene CROME, NP  Vitamin D ,  Ergocalciferol , (DRISDOL ) 1.25 MG (50000 UNIT) CAPS capsule Take 1 capsule by mouth once a week 11/03/24   Domenica Harlene LABOR, MD    Physical Exam    Vital Signs:  SHAKYIA BOSSO does not have vital signs available for review today.  Given telephonic nature of communication, physical exam is limited. AAOx3. NAD.  Normal affect.  Speech and respirations are unlabored.  Accessory Clinical Findings    None  Assessment & Plan    1.  Preoperative Cardiovascular Risk Assessment:  Ms. Herrero perioperative risk of a major cardiac event is  % according to the Revised Cardiac Risk Index (RCRI).  Therefore, she is at low risk for perioperative complications.   Her functional capacity is good at 5.07 METs according to the [place] Activity Status Index (DASI). Recommendations: According to ACC/AHA guidelines, no further cardiovascular testing needed.  The patient may proceed to surgery at acceptable risk.   Antiplatelet and/or Anticoagulation Recommendations: Aspirin  can be held for 5-7 days prior to her surgery.  Please resume Aspirin  post operatively when it is felt to be safe from a bleeding standpoint.   The patient was advised that if she develops new symptoms prior to surgery to contact our office to arrange for a follow-up visit, and she verbalized understanding.  A copy of this note will be routed to requesting surgeon.  Time:   Today, I have spent 12 minutes with the patient with telehealth technology discussing medical history, symptoms, and management plan.     Orren LOISE Fabry, PA-C  11/14/2024, 7:29 AM

## 2024-11-17 ENCOUNTER — Ambulatory Visit: Payer: Self-pay

## 2024-11-17 ENCOUNTER — Ambulatory Visit (INDEPENDENT_AMBULATORY_CARE_PROVIDER_SITE_OTHER): Admitting: Professional

## 2024-11-17 ENCOUNTER — Encounter: Payer: Self-pay | Admitting: Professional

## 2024-11-17 DIAGNOSIS — F411 Generalized anxiety disorder: Secondary | ICD-10-CM

## 2024-11-17 DIAGNOSIS — F331 Major depressive disorder, recurrent, moderate: Secondary | ICD-10-CM | POA: Diagnosis not present

## 2024-11-17 NOTE — Telephone Encounter (Signed)
 Attempt #1 to reach patient to discuss symptoms. Left VM to call back  Copied from CRM #[id number]. Topic: Clinical - Red Word Triage >> Nov 17, 2024  2:22 PM Harlene ORN wrote: Red Word that prompted transfer to Nurse Triage: 11 to 12 days; been having congestion and head pain in the face and jaw swelling for 9 days in her neck area spread to her chest; can't breath very well tightness in her chest.  CRM # [id number] Owner: None Status: Unresolved Open  Priority: Routine Created on: 11/17/2024 02:16 PM By: Hayes Carlyon LABOR   Primary Information  Source  [name] (Patient)   Subject  [name] (Patient)   Topic  Clinical - Red Word Triage    Communication  Red Word that prompted transfer to Nurse Triage: fever, very congested, difficulty breathing, chest tightness, wheezing, pt can't catch her breath

## 2024-11-17 NOTE — Telephone Encounter (Signed)
 Appt scheduled

## 2024-11-17 NOTE — Telephone Encounter (Signed)
 FYI Only or Action Required?: FYI only for provider: appointment scheduled on 11/18/2024 1:20 PM.  Patient was last seen in primary care on 10/31/2024 by Wheeler Harlene CROME, NP.  Called Nurse Triage reporting Shortness of Breath.  Symptoms began 2 weeks ago.  Interventions attempted: Rest, hydration, or home remedies.  Symptoms are: unchanged.  Triage Disposition: See PCP When Office is Open (Within 3 Days)  Patient/caregiver understands and will follow disposition?: Yes  Reason for Disposition  [1] MODERATE longstanding difficulty breathing (e.g., speaks in phrases, SOB even at rest, pulse 100-120) AND [2] SAME as normal  Answer Assessment - Initial Assessment Questions Patient reports symptoms started out sinus congestion. Report shortness of breath, congestion, chest tightness, wheezing and fever. Hx of COPD.   1. RESPIRATORY STATUS: Describe your breathing? (e.g., wheezing, shortness of breath, unable to speak, severe coughing)      Shortness of breath 2. ONSET: When did this breathing problem begin?      Started two weeks ago 3. PATTERN Does the difficult breathing come and go, or has it been constant since it started?      Intermittent with activity 4. SEVERITY: How bad is your breathing? (e.g., mild, moderate, severe)      moderate 5. RECURRENT SYMPTOM: Have you had difficulty breathing before? If Yes, ask: When was the last time? and What happened that time?      yes 6. CARDIAC HISTORY: Do you have any history of heart disease? (e.g., heart attack, angina, bypass surgery, angioplasty)      no 7. LUNG HISTORY: Do you have any history of lung disease?  (e.g., pulmonary embolus, asthma, emphysema)     yes 8. CAUSE: What do you think is causing the breathing problem?      unsure 9. OTHER SYMPTOMS: Do you have any other symptoms? (e.g., chest pain, cough, dizziness, fever, runny nose)     Cough, chest fever, runny nose 10. O2 SATURATION MONITOR:  Do  you use an oxygen saturation monitor (pulse oximeter) at home? If Yes, ask: What is your reading (oxygen level) today? What is your usual oxygen saturation reading? (e.g., 95%)       92% on Room Air 12. TRAVEL: Have you traveled out of the country in the last month? (e.g., travel history, exposures)       no  Protocols used: Breathing Difficulty-A-AH
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
## 2024-11-18 ENCOUNTER — Ambulatory Visit: Payer: Self-pay | Admitting: Rehabilitative and Restorative Service Providers"

## 2024-11-18 ENCOUNTER — Ambulatory Visit (INDEPENDENT_AMBULATORY_CARE_PROVIDER_SITE_OTHER): Admitting: Medical

## 2024-11-18 ENCOUNTER — Ambulatory Visit (HOSPITAL_BASED_OUTPATIENT_CLINIC_OR_DEPARTMENT_OTHER)
Admission: RE | Admit: 2024-11-18 | Discharge: 2024-11-18 | Disposition: A | Discharge status: Home / Self Care | Source: Ambulatory Visit | Attending: Medical | Admitting: Medical

## 2024-11-18 ENCOUNTER — Ambulatory Visit: Payer: Self-pay | Admitting: Medical

## 2024-11-18 VITALS — BP 116/80 | HR 59 | Temp 98.1°F | Resp 16 | Ht 65.0 in | Wt 126.2 lb

## 2024-11-18 DIAGNOSIS — R059 Cough, unspecified: Secondary | ICD-10-CM | POA: Diagnosis not present

## 2024-11-18 DIAGNOSIS — R0989 Other specified symptoms and signs involving the circulatory and respiratory systems: Secondary | ICD-10-CM | POA: Insufficient documentation

## 2024-11-18 DIAGNOSIS — M79609 Pain in unspecified limb: Secondary | ICD-10-CM | POA: Insufficient documentation

## 2024-11-18 DIAGNOSIS — R0981 Nasal congestion: Secondary | ICD-10-CM | POA: Diagnosis not present

## 2024-11-18 DIAGNOSIS — R5383 Other fatigue: Secondary | ICD-10-CM

## 2024-11-18 DIAGNOSIS — J40 Bronchitis, not specified as acute or chronic: Secondary | ICD-10-CM | POA: Diagnosis not present

## 2024-11-18 DIAGNOSIS — R062 Wheezing: Secondary | ICD-10-CM

## 2024-11-18 DIAGNOSIS — R0789 Other chest pain: Secondary | ICD-10-CM | POA: Diagnosis not present

## 2024-11-18 DIAGNOSIS — R051 Acute cough: Secondary | ICD-10-CM | POA: Diagnosis present

## 2024-11-18 DIAGNOSIS — R911 Solitary pulmonary nodule: Secondary | ICD-10-CM | POA: Diagnosis not present

## 2024-11-18 DIAGNOSIS — R509 Fever, unspecified: Secondary | ICD-10-CM | POA: Diagnosis not present

## 2024-11-18 DIAGNOSIS — M79605 Pain in left leg: Secondary | ICD-10-CM | POA: Diagnosis not present

## 2024-11-18 LAB — CBC WITH DIFFERENTIAL/PLATELET
Basophils Absolute: 0.1 K/uL (ref 0.0–0.1)
Basophils Relative: 1 % (ref 0.0–3.0)
Eosinophils Absolute: 0.3 K/uL (ref 0.0–0.7)
Eosinophils Relative: 3.9 % (ref 0.0–5.0)
HCT: 41 % (ref 36.0–46.0)
Hemoglobin: 13.5 g/dL (ref 12.0–15.0)
Lymphocytes Relative: 22.8 % (ref 12.0–46.0)
Lymphs Abs: 1.6 K/uL (ref 0.7–4.0)
MCHC: 32.9 g/dL (ref 30.0–36.0)
MCV: 91.7 fl (ref 78.0–100.0)
Monocytes Absolute: 0.5 K/uL (ref 0.1–1.0)
Monocytes Relative: 7.7 % (ref 3.0–12.0)
Neutro Abs: 4.6 K/uL (ref 1.4–7.7)
Neutrophils Relative %: 64.6 % (ref 43.0–77.0)
Platelets: 297 K/uL (ref 150.0–400.0)
RBC: 4.47 Mil/uL (ref 3.87–5.11)
RDW: 15.5 % (ref 11.5–15.5)
WBC: 7.1 K/uL (ref 4.0–10.5)

## 2024-11-18 LAB — POCT INFLUENZA A/B
Influenza A, POC: NEGATIVE
Influenza B, POC: NEGATIVE

## 2024-11-18 LAB — COMPREHENSIVE METABOLIC PANEL WITH GFR
ALT: 9 U/L (ref 0–35)
AST: 20 U/L (ref 0–37)
Albumin: 4.1 g/dL (ref 3.5–5.2)
Alkaline Phosphatase: 97 U/L (ref 39–117)
BUN: 14 mg/dL (ref 6–23)
CO2: 32 meq/L (ref 19–32)
Calcium: 9.2 mg/dL (ref 8.4–10.5)
Chloride: 101 meq/L (ref 96–112)
Creatinine, Ser: 1.1 mg/dL (ref 0.40–1.20)
GFR: 47.83 mL/min — ABNORMAL LOW (ref >60.00)
Glucose, Bld: 86 mg/dL (ref 70–99)
Potassium: 4.5 meq/L (ref 3.5–5.1)
Sodium: 137 meq/L (ref 135–145)
Total Bilirubin: 0.5 mg/dL (ref 0.2–1.2)
Total Protein: 7 g/dL (ref 6.0–8.3)

## 2024-11-18 LAB — POCT RAPID STREP A (OFFICE): Rapid Strep A Screen: NEGATIVE

## 2024-11-18 LAB — POC COVID19 BINAXNOW: SARS Coronavirus 2 Ag: NEGATIVE

## 2024-11-18 LAB — TROPONIN I (HIGH SENSITIVITY): High Sens Troponin I: 6 ng/L (ref 2–17)

## 2024-11-18 MED ORDER — PREDNISONE 10 MG (21) PO TBPK: ORAL_TABLET | ORAL | 0 refills | Status: DC

## 2024-11-18 MED ORDER — AZITHROMYCIN 250 MG PO TABS: ORAL_TABLET | ORAL | 0 refills | Status: AC

## 2024-11-18 NOTE — Patient Instructions (Addendum)
 Acute bronchitis with reactive airway symptoms in a patient with asthma and COPD Acute bronchitis with wheezing and chest congestion. Differential includes bronchitis, sinus infection, and possible walking pneumonia. Negative COVID, strep, and flu tests. Discussed inhaler side effects and environmental triggers. - Ordered chest x-ray stat. - azithromycin  rx. You have used recently and no reaction.So decided to rx. - Prescribed prednisone  6-day taper. Also no reaction recently with this as well per pt. - Ordered EKG and cardiac protein study.(ekg nsr. no ischemia) -advised ED evaluation when you stated initially felt like elephant on chest. You declined so advised will do ekg and only 1 set of troponin stat though this approach falls short in context. You acknowledged risk. - Advised emergency department t visit if symptoms worsen or do not improve.  Nasal congestion and fatigue Nasal congestion with dark, crusty mucus and fatigue. Negative COVID, strep, and flu tests. Discussed inhaler side effects and environmental triggers. - Prescribed benzonatate  for cough. -flonase  nasal spray  Chest pain,  probable non-cardiac etiology (musculoskeletal vs GERD) Intermittent chest pain likely related to GERD or musculoskeletal issues. Described chest pain symptoms diminished mid exam. Discussed monitoring symptoms and seeking emergency care if pain worsens or changes. - Ordered EKG and cardiac protein study. - Advised emergency department visit if chest pain worsens or changes.  Left lower extremity pain and swelling, rule out Baker's cyst vs DVT Left lower extremity pain and swelling with tenderness behind the knee. Differential includes Baker's cyst and DVT. Discussed need for ultrasound. - Ordered left lower extremity ultrasound to rule out Baker's cyst vs DVT.  will up date you on stat cbc, cmp, chest xray and US   Follow up one week or sooner if needed

## 2024-11-18 NOTE — Addendum Note (Signed)
 Addended by: DORINA DALLAS HERO on: 11/18/2024 08:59 PM   Modules accepted: Orders

## 2024-11-18 NOTE — Progress Notes (Signed)
 Subjective:    Patient ID: [name], female    DOB: 06/11/1946, 78 y.o.   MRN: [id number]  HPI [name] is a 78 year old female with asthma and COPD who presents with shortness of breath and chest congestion.  She has had worsening shortness of breath and chest congestion for two weeks. She feels unable to clear chest congestion and has morning nasal obstruction from dark, crusty, reddish mucus until she clears it. She notes wheezing, marked fatigue. She states at time she felt like could barely stand early on. Though presently she looks stable and energetic.  Early in the illness she had fevers, chills, and sweats. She has a burning sensation in her chest after coughing or using her inhaler and describes a heavy pressure on her chest like an elephant sitting on it. I explained to her that this description is concerning and for this would recommend emergency dept evaluation now. But pt declined and then minimized her description. She declined advise to be seen in the ED.  She has asthma and COPD and smoked for about fifteen years, quitting fifteen years ago. She is using albuterol  and Sialto Respimat more frequently without relief and feels the inhalers may worsen the chest burning.  She feels like mild swollen and states left side is worse than rt side.  She has tested negative for COVID-19, strep, and flu. She is worried environmental exposures such as cleaning chemicals and fragrance devices may be triggering or worsening her breathing symptoms.   Review of Systems  Constitutional:  Positive for fatigue. Negative for chills and fever.  HENT:  Positive for congestion. Negative for ear pain, mouth sores and nosebleeds.   Respiratory:  Positive for cough and wheezing. Negative for chest tightness.   Cardiovascular:  Negative for chest pain and palpitations.  Gastrointestinal:  Negative for abdominal pain.  Genitourinary:  Positive for vaginal bleeding.  Musculoskeletal:   Negative for back pain.  Neurological:  Negative for dizziness, speech difficulty, weakness and headaches.  Hematological:  Negative for adenopathy.  Psychiatric/Behavioral:  Negative for dysphoric mood and suicidal ideas.     Past Medical History:  Diagnosis Date   Abdominal pain 10/26/2013   Allergic rhinitis 01/25/2016   Allergy     ANA positive 07/29/2013   Anemia    Arthralgia 08/18/2013   Arthritis    Asthma, allergic 08/05/2012   Atrial flutter    past history- not current   Atypical chest pain 01/01/2015   Bilateral carpal tunnel syndrome 03/04/2015   CAD in native artery 08/25/2010   Minor CAD 2009, 2011, low risk Myoview  2013 and 2016        Cataract    bilaterally removed    Cellulitis of breast 02/05/2023   Chronic constipation    Chronic cough 01/09/2017   Chronic interstitial cystitis 02/01/2017   Chronic night sweats 01/08/2015   Common bile duct dilatation 02/13/2023   Concussion    x 3   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   DDD (degenerative disc disease), cervical 09/17/2018   Degenerative scoliosis 04/22/2018   Deviated septum 05/12/2016   Dysphagia    Dyspnea    Dysuria 03/09/2021   Elevated alkaline phosphatase level 01/25/2023   Essential hypertension 08/25/2010   External hemorrhoids 02/05/2015   Facial trauma 01/22/2023   Family history of adverse reaction to anesthesia    Family history of malignant hyperthermia    father had this   Fibrocystic breast disease 10/18/2010   Fibromyalgia  Frequency of urination    Generalized anxiety disorder 08/03/2010   GERD (gastroesophageal reflux disease)    Headache 10/13/2013   High serum vitamin D  05/18/2021   History of chronic bronchitis    History of colonic polyp 02/25/2019   History of hiatal hernia    History of tachycardia    CONTROLLED  WITH ATENOLOL    Hyperglycemia 03/08/2021   Hyperkalemia 09/28/2022   Hyperlipidemia    Intertrigo 12/13/2022   Kidney stone 02/26/2019   Knee pain,  right 04/30/2015   Left hip pain 04/30/2015   Leg pain 02/24/2011   Qualifier: Diagnosis of   By: Antonio DOJamee         Leukocytosis 07/05/2022   Local reaction to hymenoptera sting 05/09/2023   Low back pain 09/08/2020   Major depressive disorder 02/05/2013   Malignant neoplasm of lower-outer quadrant of right breast of female, estrogen receptor positive 01/03/2013   Nasal turbinate hypertrophy 05/12/2016   Oral herpes 02/05/2023   OSA and COPD overlap syndrome 06/10/2020   Osteoporosis 01/2019   T score -2.2 stable/improved from prior study   Pelvic pain    Peripheral neuropathy 05/22/2014   Personal history of radiation therapy    Pneumonia    Primary insomnia 06/25/2017   Pulmonary nodules 02/12/2015   Rash and other nonspecific skin eruption 05/09/2023   Recurrent falls 05/02/2020   Recurrent sinusitis 02/25/2019   Right arm pain 07/20/2016   RLS (restless legs syndrome) 11/09/2019   RUQ pain 07/05/2022   S/P radiation therapy 11/12/12 - 12/05/12   right Breast   Sciatica 04/30/2015   Sepsis 2014   from UTI    Sjogren's disease 12/12/2021   Splenic lesion 09/02/2012   Sprain of lateral ligament of ankle joint 07/31/2023   Stage 3 chronic kidney disease    Statin intolerance 02/29/2020   Stricture and stenosis of esophagus 10/19/2011   Tongue lesion 02/05/2023   Tremor 07/05/2022   Urgency of urination    Vertigo 01/22/2023   Vitamin B12 deficiency 03/08/2021   Vocal cord cyst 05/18/2021     Social History   Socioeconomic History   Marital status: Married    Spouse name: Tanda    Number of children: 3   Years of education: 16   Highest education level: Bachelor's degree (e.g., BA, AB, BS)  Occupational History   Occupation: Retired    Comment: [employer]  Tobacco Use   Smoking status: Former    Current packs/day: 0.00    Average packs/day: 1 pack/day for 15.0 years (15.0 ttl pk-yrs)    Types: Cigarettes    Start date: 05/27/1990    Quit date: 05/27/2005     Years since quitting: 19.4    Passive exposure: Never   Smokeless tobacco: Never  Vaping Use   Vaping status: Never Used  Substance and Sexual Activity   Alcohol use: No    Alcohol/week: 0.0 standard drinks of alcohol   Drug use: No   Sexual activity: Not Currently    Partners: Male    Birth control/protection: Surgical    Comment: 1st intercourse 78 yo-Fewer than 5 partners, hysterectomy  Other Topics Concern   Not on file  Social History Narrative   Lives with husband.   Caffeine use: 1/2 cup per day   Exercise-- 2days a week [employer],  Water  aerobics, walking       Retired engineer, civil (consulting)   No dietary restrictions, tries to maintain a heart healthy diet   Very pleasant lady  Social Drivers of Corporate Investment Banker Strain: Low Risk  (10/19/2024)   Overall Financial Resource Strain (CARDIA)    Difficulty of Paying Living Expenses: Not hard at all  Food Insecurity: No Food Insecurity (10/19/2024)   Hunger Vital Sign    Worried About Running Out of Food in the Last Year: Never true    Ran Out of Food in the Last Year: Never true  Transportation Needs: No Transportation Needs (10/19/2024)   PRAPARE - Administrator, Civil Service (Medical): No    Lack of Transportation (Non-Medical): No  Physical Activity: Insufficiently Active (10/19/2024)   Exercise Vital Sign    Days of Exercise per Week: 2 days    Minutes of Exercise per Session: 20 min  Stress: No Stress Concern Present (10/19/2024)   [employer] of Occupational Health - Occupational Stress Questionnaire    Feeling of Stress: Only a little  Social Connections: Socially Integrated (10/19/2024)   Social Connection and Isolation Panel    Frequency of Communication with Friends and Family: More than three times a week    Frequency of Social Gatherings with Friends and Family: Twice a week    Attends Religious Services: More than 4 times per year    Active Member of Clubs or Organizations: Yes    Attends Occupational Hygienist Meetings: More than 4 times per year    Marital Status: Married  Catering Manager Violence: Not At Risk (03/04/2024)   Humiliation, Afraid, Rape, and Kick questionnaire    Fear of Current or Ex-Partner: No    Emotionally Abused: No    Physically Abused: No    Sexually Abused: No    Past Surgical History:  Procedure Laterality Date   35 HOUR PH STUDY N/A 04/03/2016   Procedure: 24 HOUR PH STUDY;  Surgeon: Elspeth Deward Naval, MD;  Location: [facility];  Service: Gastroenterology;  Laterality: N/A;   ANKLE ARTHROSCOPY WITH RECONSTRUCTION Left 03/12/2024   Procedure: ANKLE ARTHROSCOPY WITH OPEN LATERAL ANKLE LIGAMENT STABILIZATION;  Surgeon: Barton Drape, MD;  Location: [facility];  Service: Orthopedics;  Laterality: Left;   BREAST BIOPSY Right 08/23/2012   ADH   BREAST BIOPSY Right 10/11/2012   Ductal Carcinoma   BREAST EXCISIONAL BIOPSY Right 09/04/2013   benign   BREAST LUMPECTOMY Right 10/11/2012   W/ [facility]N BX   CARDIAC CATHETERIZATION  09-13-2007  DR MORRIS   WELL-PRESERVED LVF/  DIFFUSE SCATTERED CORONARY CALCIFACATION AND ATHEROSCLEROSIS WITHOUT OBSTRUCTION   CARDIAC CATHETERIZATION  08-04-2010  DR MCALHANY   NON-OBSTRUCTIVE CAD/  pLAD 40%/  oLAD 30%/  mLAD 30%/  pRCA 30%/  EF 60%   CARDIOVASCULAR STRESS TEST  06-18-2012  DR McALHANY   LOW RISK NUCLEAR STUDY/  SMALL FIXED AREA OF MODERATELY DECREASED UPTAKE IN ANTEROSEPTAL WALL WHICH MAY BE ARTIFACTUAL/  NO ISCHEMIA/  EF 68%   COLONOSCOPY  09/29/2010   CYSTOSCOPY     CYSTOSCOPY WITH HYDRODISTENSION AND BIOPSY N/A 03/06/2014   Procedure: CYSTOSCOPY/HYDRODISTENSION/ INSTILATION OF MARCAINE  AND PYRIDIUM ;  Surgeon: Arlena LILLETTE Gal, MD;  Location: [facility];  Service: Urology;  Laterality: N/A;   CYSTOSCOPY/URETEROSCOPY/HOLMIUM LASER/STENT PLACEMENT Left 03/21/2023   Procedure: CYSTOSCOPY LEFT RETROGRADE PYELOGRAM, LEFT URETEROSCOPY, HOLMIUM LASER LITHOTRIPSYM, AND LEFT  URETERAL STENT PLACEMENT;  Surgeon: Devere Lonni Righter, MD;  Location: [facility];  Service: Urology;  Laterality: Left;  60 MINUTES   ESOPHAGEAL MANOMETRY N/A 04/03/2016   Procedure: ESOPHAGEAL MANOMETRY (EM);  Surgeon: Elspeth Deward Naval, MD;  Location: [facility];  Service: Gastroenterology;  Laterality: N/A;   EXTRACORPOREAL SHOCK WAVE LITHOTRIPSY Left 02/06/2019   Procedure: EXTRACORPOREAL SHOCK WAVE LITHOTRIPSY (ESWL);  Surgeon: Ottelin, Mark, MD;  Location: [facility];  Service: Urology;  Laterality: Left;   NASAL SINUS SURGERY  1985   ORIF RIGHT ANKLE  FX  2006   POLYPECTOMY     REMOVAL VOCAL CORD CYST  01/2013   REPAIR OF PERONEUS BREVIS TENDON Left 03/12/2024   Procedure: REPAIR OF PERONEUS TENDON;  Surgeon: Barton Drape, MD;  Location: [facility];  Service: Orthopedics;  Laterality: Left;   RIGHT BREAST BX  08/23/2012   RIGHT HAND SURGERY  X3  LAST ONE 2009   INCLUDES  ORIF RIGHT 5TH FINGER AND REVISION TWICE   SKIN CANCER EXCISION     face,arms   TONSILLECTOMY AND ADENOIDECTOMY  AGE 68   TOTAL ABDOMINAL HYSTERECTOMY W/ BILATERAL SALPINGOOPHORECTOMY  1982   W/  APPENDECTOMY   TRANSTHORACIC ECHOCARDIOGRAM  06/24/2012   GRADE I DIASTOLIC DYSFUNCTION/  EF 55-60%/  MILD MR   UPPER GASTROINTESTINAL ENDOSCOPY      Family History  Problem Relation Age of Onset   Rectal cancer Mother    Colon cancer Mother    Pancreatic cancer Mother    Diabetes Mother    Breast cancer Mother 53   Breast cancer Maternal Aunt        breast   Birth defects Maternal Aunt    Irritable bowel syndrome Son    Heart disease Son        CAD, MV replacement   Allergic Disorder Daughter    Diabetes Daughter    Arthritis Daughter    Colon cancer Father    Colon polyps Father    Diabetes Father    Stroke Father    Heart disease Father        CHF   Hyperlipidemia Father    Hypertension Father    Arthritis Father    Breast cancer Paternal Aunt    Breast cancer Paternal  Aunt    Arthritis Paternal Uncle    Allergic Disorder Daughter    Heart disease Cousin        CAD, had a blood clot after stenting   Arthritis Sister    Esophageal cancer Neg Hx    Stomach cancer Neg Hx     Allergies  Allergen Reactions   Clindamycin Hcl Shortness Of Breath and Rash   Levofloxacin  Rash    Other reaction(s): sick, rash hives severe itching   Other Other (See Comments)    Father had malignant hyperthermia   Penicillins Anaphylaxis   Rosuvastatin  Anaphylaxis   Baclofen  Rash   Codeine Hives and Nausea Only   Erythromycin Hives   Haemophilus Influenzae Vaccines Other (See Comments)    Local reaction at site   Latex Hives   Lincomycin Other (See Comments)   Pentazocine Lactate Other (See Comments)    HALLUCINATION   Pneumococcal Vaccine Polyvalent Hives, Swelling and Other (See Comments)    REACTION: swelling at injection site   Prednisone  Rash    Other reaction(s): Rash, Hives - can tolerate if she takes with benadryl     Tamoxifen  Nausea And Vomiting   Selenium    Seroquel  [Quetiapine ] Dermatitis   Doxycycline  Rash   Fluzone [Influenza Virus Vaccine] Hives    Local reaction at the site   Haemophilus Influenzae Other (See Comments)    Local reaction at the site     Current Outpatient Medications  on File Prior to Visit  Medication Sig Dispense Refill   acetaminophen  (TYLENOL ) 500 MG tablet Take 1,000 mg by mouth every 6 (six) hours as needed for mild pain or headache.      albuterol  (VENTOLIN  HFA) 108 (90 Base) MCG/ACT inhaler Inhale 2 puffs into the lungs every 4 (four) hours as needed for wheezing or shortness of breath (coughing fits). 18 g 1   amLODipine  (NORVASC ) 5 MG tablet Take 1 tablet by mouth once daily 90 tablet 3   aspirin  EC 81 MG tablet Take 1 tablet (81 mg total) by mouth daily. 90 tablet 3   atenolol  (TENORMIN ) 25 MG tablet Take 1 tablet (25 mg total) by mouth 2 (two) times daily. 180 tablet 1   carboxymethylcellul-glycerin (OPTIVE) 0.5-0.9  % ophthalmic solution Place 1 drop into both eyes daily as needed for dry eyes.     cyanocobalamin  (VITAMIN B12) 1000 MCG tablet Take 1,000 mg by mouth daily with breakfast.     diclofenac Sodium (VOLTAREN) 1 % GEL Apply 2 g topically daily as needed (Back pain).     DULoxetine  (CYMBALTA ) 60 MG capsule Take 1 capsule by mouth twice daily 180 capsule 0   EPINEPHrine  0.3 mg/0.3 mL IJ SOAJ injection Inject 0.3 mg into the muscle as needed for anaphylaxis. 2 each 1   estradiol  (ESTRACE ) 0.01 % CREA vaginal cream Place 1 Applicatorful vaginally 3 (three) times a week.     fluticasone  (FLONASE ) 50 MCG/ACT nasal spray Place 2 sprays into both nostrils daily. 16 g 5   fluticasone  (FLOVENT  HFA) 220 MCG/ACT inhaler Inhale 1-2 puffs into the lungs in the morning and at bedtime. 1 each 12   folic acid  (FOLVITE ) 1 MG tablet Take 1 tablet (1 mg total) by mouth daily.     furosemide  (LASIX ) 20 MG tablet TAKE 1 TABLET BY MOUTH AS NEEDED FOR EDEMA (Patient taking differently: Take 20 mg by mouth. Once every 3 days) 90 tablet 3   gabapentin  (NEURONTIN ) 300 MG capsule Take 2 capsules (600 mg total) by mouth in the morning AND 1 capsule (300 mg total) every evening AND 3 capsules (900 mg total) at bedtime. 180 capsule 1   hydrOXYzine  (ATARAX ) 10 MG tablet Take 0.5-2 tablets (5-20 mg total) by mouth 3 (three) times daily as needed for anxiety (not with Loratadine ). 90 tablet 2   linaclotide  (LINZESS ) 145 MCG CAPS capsule Take 1 capsule (145 mcg total) by mouth daily before breakfast. 30 capsule 5   nitroGLYCERIN  (NITROSTAT ) 0.4 MG SL tablet Place 1 tablet (0.4 mg total) under the tongue every 5 (five) minutes x 3 doses as needed for chest pain. 25 tablet 2   nystatin  cream (MYCOSTATIN ) Apply 1 application topically 2 (two) times daily. 30 g 2   pantoprazole  (PROTONIX ) 40 MG tablet Take 1 tablet by mouth 2 (two) times daily.     polyethylene glycol (MIRALAX ) 17 g packet Take 17 g by mouth 2 (two) times daily. 14 each 0    REPATHA  SURECLICK 140 MG/ML SOAJ INJECT CONTENTS OF 1 PEN SUBCUTANEOUSLY EVERY 14 DAYS 6 mL 0   RSV vaccine recomb adjuvanted (AREXVY ) 120 MCG/0.5ML injection Inject into the muscle. 1 each 0   Tiotropium Bromide-Olodaterol (STIOLTO RESPIMAT ) 2.5-2.5 MCG/ACT AERS Inhale 2 puffs into the lungs daily. 1 each 5   Tiotropium Bromide-Olodaterol (STIOLTO RESPIMAT ) 2.5-2.5 MCG/ACT AERS 1 sample     traMADol  (ULTRAM ) 50 MG tablet Take 1 tablet by mouth 3 (three) times daily as needed.  traZODone  (DESYREL ) 100 MG tablet Take 1 tablet (100 mg total) by mouth at bedtime. 90 tablet 1   Vitamin D , Ergocalciferol , (DRISDOL ) 1.25 MG (50000 UNIT) CAPS capsule Take 1 capsule by mouth once a week 4 capsule 0   sucralfate  (CARAFATE ) 1 GM/10ML suspension Take 10 mLs (1 g total) by mouth 4 (four) times daily -  with meals and at bedtime. (Patient not taking: Reported on 11/18/2024) 420 mL 1   No current facility-administered medications on file prior to visit.    BP 116/80   Pulse (!) 59   Temp 98.1 F (36.7 C) (Oral)   Resp 16   Ht 5' 5 (1.651 m)   Wt 126 lb 3.2 oz (57.2 kg)   SpO2 96%   BMI 21.00 kg/m        Objective:   Physical Exam  General Mental Status- Alert. General Appearance- Not in acute distress.   Skin General: Color- Normal Color. Moisture- Normal Moisture.  Neck Carotid Arteries- Normal color. Moisture- Normal Moisture. No carotid bruits. No JVD.  Chest and Lung Exam Auscultation: Breath Sounds:-Even and unlabored. Upper lobe scattered wheezing.  Cardiovascular Auscultation:Rythm- RRR Murmurs & Other Heart Sounds:Auscultation of the heart reveals- No Murmurs.  Abdomen Inspection:-Inspeection Normal. Palpation/Percussion:Note:No mass. Palpation and Percussion of the abdomen reveal- Non Tender, Non Distended + BS, no rebound or guarding.    Neurologic Cranial Nerve exam:- CN III-XII intact(No nystagmus), symmetric smile. Drift Test:- No drift. Romberg Exam:-  Negative.  Heal to Toe Gait exam:-Normal. Finger to Nose:- Normal/Intact Strength:- 5/5 equal and symmetric strength both upper and lower extremities.   Lower ext- both calfs appear symmetric when I measure. No pedal edema. Faint + homans sign left side.    Assessment & Plan:   Patient Instructions  Acute bronchitis with reactive airway symptoms in a patient with asthma and COPD Acute bronchitis with wheezing and chest congestion. Differential includes bronchitis, sinus infection, and possible walking pneumonia. Negative COVID, strep, and flu tests. Discussed inhaler side effects and environmental triggers. - Ordered chest x-ray stat. - azithromycin  rx. You have used recently and no reaction. - Prescribed prednisone  6-day taper. Also no reaction recently with this as well - Ordered EKG and cardiac protein study.(ekg nsr. no ischemia) -advised ED evaluation when you stated initially felt like elephant on chest. You declined so advised will do ekg and only 1 set of troponin stat though this approach falls short in context. You acknowledged risk. - Advised emergency department t visit if symptoms worsen or do not improve.  Nasal congestion and fatigue Nasal congestion with dark, crusty mucus and fatigue. Negative COVID, strep, and flu tests. Discussed inhaler side effects and environmental triggers. - Prescribed benzonatate  for cough. -flonase  nasal spray  Chest pain,  probable non-cardiac etiology (musculoskeletal vs GERD) Intermittent chest pain likely related to GERD or musculoskeletal issues. Described chest pain symptoms diminished mid exam. Discussed monitoring symptoms and seeking emergency care if pain worsens or changes. - Ordered EKG and cardiac protein study. - Advised emergency department visit if chest pain worsens or changes.  Left lower extremity pain and swelling, rule out Baker's cyst vs DVT Left lower extremity pain and swelling with tenderness behind the knee.  Differential includes Baker's cyst and DVT. Discussed need for ultrasound. - Ordered left lower extremity ultrasound to rule out Baker's cyst vs DVT.  will up date you on stat cbc, cmp, chest xray and US   Follow up one week or sooner if needed   I personally  spent a total of 45 minutes in the care of the patient today including performing a medically appropriate exam/evaluation, counseling and educating, placing orders, and documenting clinical information in the EHR.

## 2024-11-18 NOTE — Therapy (Deleted)
 OUTPATIENT PHYSICAL THERAPY TREATMENT     Patient Name: [name] MRN: [id number] DOB:10/11/1946, 78 y.o., female Today's Date: 11/18/2024  END OF SESSION:       Past Medical History:  Diagnosis Date   Abdominal pain 10/26/2013   Allergic rhinitis 01/25/2016   Allergy     ANA positive 07/29/2013   Anemia    Arthralgia 08/18/2013   Arthritis    Asthma, allergic 08/05/2012   Atrial flutter    past history- not current   Atypical chest pain 01/01/2015   Bilateral carpal tunnel syndrome 03/04/2015   CAD in native artery 08/25/2010   Minor CAD 2009, 2011, low risk Myoview  2013 and 2016        Cataract    bilaterally removed    Cellulitis of breast 02/05/2023   Chronic constipation    Chronic cough 01/09/2017   Chronic interstitial cystitis 02/01/2017   Chronic night sweats 01/08/2015   Common bile duct dilatation 02/13/2023   Concussion    x 3   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   DDD (degenerative disc disease), cervical 09/17/2018   Degenerative scoliosis 04/22/2018   Deviated septum 05/12/2016   Dysphagia    Dyspnea    Dysuria 03/09/2021   Elevated alkaline phosphatase level 01/25/2023   Essential hypertension 08/25/2010   External hemorrhoids 02/05/2015   Facial trauma 01/22/2023   Family history of adverse reaction to anesthesia    Family history of malignant hyperthermia    father had this   Fibrocystic breast disease 10/18/2010   Fibromyalgia    Frequency of urination    Generalized anxiety disorder 08/03/2010   GERD (gastroesophageal reflux disease)    Headache 10/13/2013   High serum vitamin D  05/18/2021   History of chronic bronchitis    History of colonic polyp 02/25/2019   History of hiatal hernia    History of tachycardia    CONTROLLED  WITH ATENOLOL    Hyperglycemia 03/08/2021   Hyperkalemia 09/28/2022   Hyperlipidemia    Intertrigo 12/13/2022   Kidney stone 02/26/2019   Knee pain, right 04/30/2015   Left hip pain 04/30/2015   Leg  pain 02/24/2011   Qualifier: Diagnosis of   By: Antonio DOJamee         Leukocytosis 07/05/2022   Local reaction to hymenoptera sting 05/09/2023   Low back pain 09/08/2020   Major depressive disorder 02/05/2013   Malignant neoplasm of lower-outer quadrant of right breast of female, estrogen receptor positive 01/03/2013   Nasal turbinate hypertrophy 05/12/2016   Oral herpes 02/05/2023   OSA and COPD overlap syndrome 06/10/2020   Osteoporosis 01/2019   T score -2.2 stable/improved from prior study   Pelvic pain    Peripheral neuropathy 05/22/2014   Personal history of radiation therapy    Pneumonia    Primary insomnia 06/25/2017   Pulmonary nodules 02/12/2015   Rash and other nonspecific skin eruption 05/09/2023   Recurrent falls 05/02/2020   Recurrent sinusitis 02/25/2019   Right arm pain 07/20/2016   RLS (restless legs syndrome) 11/09/2019   RUQ pain 07/05/2022   S/P radiation therapy 11/12/12 - 12/05/12   right Breast   Sciatica 04/30/2015   Sepsis 2014   from UTI    Sjogren's disease 12/12/2021   Splenic lesion 09/02/2012   Sprain of lateral ligament of ankle joint 07/31/2023   Stage 3 chronic kidney disease    Statin intolerance 02/29/2020   Stricture and stenosis of esophagus 10/19/2011   Tongue lesion 02/05/2023   Tremor  07/05/2022   Urgency of urination    Vertigo 01/22/2023   Vitamin B12 deficiency 03/08/2021   Vocal cord cyst 05/18/2021   Past Surgical History:  Procedure Laterality Date   24 HOUR PH STUDY N/A 04/03/2016   Procedure: 24 HOUR PH STUDY;  Surgeon: Elspeth Deward Naval, MD;  Location: [facility];  Service: Gastroenterology;  Laterality: N/A;   ANKLE ARTHROSCOPY WITH RECONSTRUCTION Left 03/12/2024   Procedure: ANKLE ARTHROSCOPY WITH OPEN LATERAL ANKLE LIGAMENT STABILIZATION;  Surgeon: Barton Drape, MD;  Location: [facility];  Service: Orthopedics;  Laterality: Left;   BREAST BIOPSY Right 08/23/2012   ADH   BREAST BIOPSY  Right 10/11/2012   Ductal Carcinoma   BREAST EXCISIONAL BIOPSY Right 09/04/2013   benign   BREAST LUMPECTOMY Right 10/11/2012   W/ SLN BX   CARDIAC CATHETERIZATION  09-13-2007  DR MORRIS   WELL-PRESERVED LVF/  DIFFUSE SCATTERED CORONARY CALCIFACATION AND ATHEROSCLEROSIS WITHOUT OBSTRUCTION   CARDIAC CATHETERIZATION  08-04-2010  DR MCALHANY   NON-OBSTRUCTIVE CAD/  pLAD 40%/  oLAD 30%/  mLAD 30%/  pRCA 30%/  EF 60%   CARDIOVASCULAR STRESS TEST  06-18-2012  DR McALHANY   LOW RISK NUCLEAR STUDY/  SMALL FIXED AREA OF MODERATELY DECREASED UPTAKE IN ANTEROSEPTAL WALL WHICH MAY BE ARTIFACTUAL/  NO ISCHEMIA/  EF 68%   COLONOSCOPY  09/29/2010   CYSTOSCOPY     CYSTOSCOPY WITH HYDRODISTENSION AND BIOPSY N/A 03/06/2014   Procedure: CYSTOSCOPY/HYDRODISTENSION/ INSTILATION OF MARCAINE  AND PYRIDIUM ;  Surgeon: Arlena LILLETTE Gal, MD;  Location: [facility];  Service: Urology;  Laterality: N/A;   CYSTOSCOPY/URETEROSCOPY/HOLMIUM LASER/STENT PLACEMENT Left 03/21/2023   Procedure: CYSTOSCOPY LEFT RETROGRADE PYELOGRAM, LEFT URETEROSCOPY, HOLMIUM LASER LITHOTRIPSYM, AND LEFT URETERAL STENT PLACEMENT;  Surgeon: Devere Lonni Righter, MD;  Location: [facility];  Service: Urology;  Laterality: Left;  60 MINUTES   ESOPHAGEAL MANOMETRY N/A 04/03/2016   Procedure: ESOPHAGEAL MANOMETRY (EM);  Surgeon: Elspeth Deward Naval, MD;  Location: [facility];  Service: Gastroenterology;  Laterality: N/A;   EXTRACORPOREAL SHOCK WAVE LITHOTRIPSY Left 02/06/2019   Procedure: EXTRACORPOREAL SHOCK WAVE LITHOTRIPSY (ESWL);  Surgeon: Ottelin, Mark, MD;  Location: [facility];  Service: Urology;  Laterality: Left;   NASAL SINUS SURGERY  1985   ORIF RIGHT ANKLE  FX  2006   POLYPECTOMY     REMOVAL VOCAL CORD CYST  01/2013   REPAIR OF PERONEUS BREVIS TENDON Left 03/12/2024   Procedure: REPAIR OF PERONEUS TENDON;  Surgeon: Barton Drape, MD;  Location: [facility];  Service: Orthopedics;  Laterality: Left;    RIGHT BREAST BX  08/23/2012   RIGHT HAND SURGERY  X3  LAST ONE 2009   INCLUDES  ORIF RIGHT 5TH FINGER AND REVISION TWICE   SKIN CANCER EXCISION     face,arms   TONSILLECTOMY AND ADENOIDECTOMY  AGE 89   TOTAL ABDOMINAL HYSTERECTOMY W/ BILATERAL SALPINGOOPHORECTOMY  1982   W/  APPENDECTOMY   TRANSTHORACIC ECHOCARDIOGRAM  06/24/2012   GRADE I DIASTOLIC DYSFUNCTION/  EF 55-60%/  MILD MR   UPPER GASTROINTESTINAL ENDOSCOPY     Patient Active Problem List   Diagnosis Date Noted   Chronic back pain 10/31/2024   Breast discharge 08/27/2024   Acute left ankle pain 08/14/2024   Pedal edema 05/20/2024   Otalgia of both ears 04/23/2024   Tinnitus of both ears 04/23/2024   IDA (iron deficiency anemia) 09/12/2023   Stage 3 chronic kidney disease    Right ankle pain 07/31/2023   Common bile duct dilatation  02/13/2023   Oral herpes 02/05/2023   Elevated alkaline phosphatase level 01/25/2023   Vertigo 01/22/2023   Intertrigo 12/13/2022   Anemia 09/28/2022   Tremor 07/05/2022   RUQ pain 07/05/2022   Leukocytosis 07/05/2022   Sjogren's disease 12/12/2021   Vocal cord cyst 05/18/2021   Vitamin D  deficiency 05/18/2021   Dysuria 03/09/2021   Vitamin B12 deficiency 03/08/2021   Hyperglycemia 03/08/2021   OSA and COPD overlap syndrome 06/10/2020   Recurrent falls 05/02/2020   Statin intolerance 02/29/2020   RLS (restless legs syndrome) 11/09/2019   Recurrent sinusitis 02/25/2019   History of colonic polyp 02/25/2019   DDD (degenerative disc disease), cervical 09/17/2018   Degenerative scoliosis 04/22/2018   Primary insomnia 06/25/2017   Chronic interstitial cystitis 02/01/2017   Deviated nasal septum 05/12/2016   Hypertrophy of nasal turbinates 05/12/2016   Dysphagia    Allergic rhinitis 01/25/2016   Dyspnea    Sciatica 04/30/2015   Bilateral carpal tunnel syndrome 03/04/2015   Hyperlipidemia 03/01/2015   Pulmonary nodules 02/12/2015   External hemorrhoids 02/05/2015   Chronic  night sweats 01/08/2015   Peripheral neuropathy 05/22/2014   Headache 10/13/2013   Arthralgia 08/18/2013   ANA positive 07/29/2013   Depression with anxiety 02/05/2013   Malignant neoplasm of lower-outer quadrant of right breast of female, estrogen receptor positive 01/03/2013   Asthma, allergic 08/05/2012   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   GERD (gastroesophageal reflux disease) 06/03/2012   Stricture and stenosis of esophagus 10/19/2011   Constipation 07/21/2011   Osteoporosis 04/17/2011   Fibrocystic breast disease 10/18/2010   Essential hypertension 08/25/2010   CAD in native artery 08/25/2010   Generalized anxiety disorder 08/03/2010   Bronchitis 03/10/2010   Fibromyalgia 01/11/2010   PCP: Domenica Harlene LABOR, MD  REFERRING PROVIDER:   Barton Drape, MD (peroneal tendinitis)  Sheri Camie BRAVO, PA-C  (Unstable gait)   Bonner Ade, MD (radiculopathy)    Mikel Leeroy Jenkins HERO, MD  (Pain in right elbow, pain in left elbow)   REFERRING DIAG:  M76.72 (ICD-10-CM) - Peroneal tendinitis, left leg   M54.16 (ICD-10-CM) - Radiculopathy, lumbar region    M25.521 (ICD-10-CM) - Pain in right elbow  M25.522 (ICD-10-CM) - Pain in left elbow   R26.81 (ICD-10-CM) - Unstable gait   THERAPY DIAG:  No diagnosis found.  Rationale for Evaluation and Treatment: Rehabilitation  ONSET DATE: 03/12/24  SUBJECTIVE:  SUBJECTIVE STATEMENT: ***Patient reports she has been dealing with GI issues since last session (cramping, constipation, diarrhea, vomiting). She saw her GI nurse practitioner who recommended soft diet and will f/u in 2 weeks. Symptoms are improving since onset and she has been able to eat more. My elbows are killing me. The back is not good. The foot is still not right.   PERTINENT HISTORY: S/p Lt ankle arthroscopy Brostrom stabilization, peroneus brevis and longus debridement and tenosynovectomy 03/12/24 See extensive PMH above   PAIN:  Are you having pain? Yes:  NPRS scale:  6 currently/10 Pain location:  low/mid back Pain description: pressure Aggravating factors: walking, standing,sitting Relieving factors: heat, lying flat   Are you having pain? Yes: NPRS scale: 3 Pain location: lateral aspect of left foot Pain description: sore Aggravating factors: pressure, WB Relieving factors: unsure   Are you having pain? Yes: NPRS scale: 6 Pain location: bilateral elbows  Pain description: hot, inflammed Aggravating factors: carrying, kitchen tasks, sensitive to touch Relieving factors: nothing  PRECAUTIONS: Fall,progress per protocol; osteoporosis   WEIGHT BEARING RESTRICTIONS: No  FALLS:  Has  patient fallen in last 6 months? Yes. Number of falls 2 (fell stepping up grassy hill, fell in her kitchen from turning quickly)  LIVING ENVIRONMENT: Lives with: lives with their spouse Lives in: House/apartment Stairs: Yes: Internal: 16 steps; on left going up Has following equipment at home: Single point cane  PATIENT GOALS: I want to be able to walk again.   OBJECTIVE:  Note: Objective measures were completed at Evaluation unless otherwise noted.  DIAGNOSTIC FINDINGS: none on file   PATIENT SURVEYS:  FADI: 46/104; 44% 06/26/24: FADI: 67/100; 67%  08/11/24: modified oswestry 30/50; 60% disability   10/29/24: 48% disability modified oswestry   COGNITION: Overall cognitive status: Within functional limits for tasks assessed     SENSATION: See palpation  EDEMA:  Mild swelling about Lt ankle   PALPATION: Hypersensitivity to palpation about distal fibula, lateral malleolus   LUMBAR ROM:  Active  A/PROM  eval  Flexion 50% limited LBP and RLE pain   Extension WNL LBP  Right lateral flexion 25% limited  Left lateral flexion 25% limited   Right rotation WNL  Left rotation WNL   (Blank rows = not tested)  LOWER EXTREMITY ROM: Active ROM Right eval Left eval 05/05/24 Left  05/20/24 Left  06/03/24 Left   Hip flexion       Hip  extension       Hip abduction       Hip adduction       Hip internal rotation       Hip external rotation       Knee flexion       Knee extension       Ankle dorsiflexion  6 9  10   Ankle plantarflexion  50  65 67  Ankle inversion  15  15 17   Ankle eversion  8  8 9    (Blank rows = not tested)  LOWER EXTREMITY MMT: MMT Right eval Left eval 06/26/24 Left  08/04/24 Left  08/11/24   Hip flexion     Rt: 4; Lt: 4-  Hip extension       Hip abduction     4 bilateral   Hip adduction       Hip internal rotation       Hip external rotation       Knee flexion       Knee extension       Ankle dorsiflexion  3+ 4+ 4+   Ankle plantarflexion  3+ sitting 4- in sitting 4 in sitting    Ankle inversion  3+ 4- 4   Ankle eversion  3+ 4 5    (Blank rows = not tested)  UPPER EXTREMITY MMT: MMT Right 10/29/24 Left 10/29/24  Shoulder flexion    Shoulder extension    Shoulder abduction    Shoulder adduction    Shoulder extension    Shoulder internal rotation    Shoulder external rotation    Middle trapezius    Lower trapezius    Elbow flexion 4+ 4+  Elbow extension 5 4  Wrist flexion 5 5  Wrist extension 4- pain  4+  Wrist ulnar deviation    Wrist radial deviation    Wrist pronation 5 5  Wrist supination 4- 4-  Grip strength     (Blank rows = not tested)  FUNCTIONAL TESTS:  TUG: 13.5 seconds Romberg x 30 seconds Semi-tandem unable   06/09/24: TUG: 11.1 seconds   06/26/24: tandem 1 second   08/11/24: Rt rolling requires use  of UE support on the hip to roll, increased pain   GAIT: Distance walked: 20 ft  Assistive device utilized: Single point cane. Ankle brace Level of assistance: Modified independence Comments: limited push-off LLE, foot ER,    [facility] Adult PT Treatment:                                                DATE: 11/18/24 Therapeutic Exercise: *** Manual Therapy: *** Neuromuscular re-ed: *** Therapeutic Activity: *** Gait: *** Modalities: *** Self  Care: ***   [facility] Adult PT Treatment:                                                DATE: 11/12/24 Therapeutic Exercise: HEP review/update Manual Therapy: Gentle STM to bilateral forearm flexor mass  Therapeutic Activity: Resisted wrist extension 2 x 10 @ 1 lb Putty gripping x 10 each  Resisted wrist flexion 2 x 10 @ 1 lb  Self Care: Ice for pain control to elbows (no more than 5 minutes)   [facility] Adult PT Treatment:                                                DATE: 10/29/24 Therapeutic Exercise: Reviewed and updated HEP discussing frequency, sets, reps.   Self Care: Pain neuroscience education and benefits of mindfulness/meditation Modalities for pain control including TENS, heat, etc.    [facility] Adult PT Treatment:                                                DATE: 10/08/24 Therapeutic Activity: Sit<>stand x 5 reps-- patient demonstrates without UE use Standing heel raises Standing toe raises Standing gastroc stretch *Performed these activities as part of general strength and mobility plan Discussed doing daily movement and then adding in VOR (for dizziness), foot towel scrunches (for foot pain), lumbar stabilization (for back pain) if those specific symptoms arise We discussed plan to hold therapy for 2 weeks while adjusting meds and recovering from recent trip-- then we will check HEP and progress difficulty if needed Discussed referral for elbows-- see assessment BP=104/65 Gait: Patient unsteady upon rising today and is mod indep with SPC and CGA without device for short distances in the clinic   [facility] Adult PT Treatment:                                                DATE: 09/24/24 Therapeutic Exercise: Back extensions attempted however pt could not complete  Neuromuscular re-ed: Adductor squeeze with ball between knees x20 Ball press squeeze for TA activation in seated x8  Self Care: Educated on use of TENS and how it can be used for self pain management at  home Recommended  urology follow up    [facility] Adult PT Treatment:  DATE: 09/22/24 PHYSICAL THERAPY RE-EVALUATION: Modified disability = 54% (was 60%) Palpation of L lateral ankle with pain that shoots along border of foot 5 time sit<>stand=22.37 seconds today Pain is present in low back during sit<>supine Discussed daily activities around her home and what is limited== patient notes feeling greater unsteadiness today-- this can vary; she also has intermittent L foot pain and low back pain.  Therapeutic Exercise Toe extension alternating big toe and other 4 digits (toe yoga) Used a ball under the ball of her foot to add toe spreading as toe yoga activities-- unable today Neuromuscular re-ed: Tandem=1 second R and L-- difficulty dec'ing UE support today Standing narrow base of support is challenging today with patient reaching for hand held support Rolling R<>L without dizziness   PATIENT EDUCATION:  Education details:see treatment  Person educated: Patient Education method: Explanation, demo, cues, handout Education comprehension: verbalized understanding, returned demo, cues   HOME EXERCISE PROGRAM: Access Code: [email or web address] URL: [email or web address] Date: 11/12/2024 Prepared by: Lucie Meeter  Exercises - Sit to Stand  - 1 x daily - 3 x weekly - 1 sets - 10 reps - Toe Raises with Counter Support  - 1 x daily - 3 x weekly - 2 sets - 10 reps - Heel Raises with Counter Support  - 1 x daily - 3 x weekly - 2 sets - 10 reps - Standing Hip Abduction with Counter Support  - 1 x daily - 3 x weekly - 2 sets - 10 reps - Gastroc Stretch on Wall  - 1 x daily - 3 x weekly - 3 sets - 30 sec  hold - Towel Scrunches  - 1 x daily - 3 x weekly - 1 sets - 5 reps - Standing Elbow Extension with Self-Anchored Resistance  - 1 x daily - 3 x weekly - 2 sets - 10 reps - Standing Single Arm Elbow Flexion with Resistance  - 1 x daily - 3 x weekly -  2 sets - 10 reps - Putty Squeezes  - 1 x daily - 3 x weekly - 2 sets - 10 reps - Standing Gaze Stabilization with Head Rotation  - 1 x daily - 7 x weekly - 2 sets - 10 reps - 5 Minute Guided Meditation  - 7 x weekly - 10 Minute Guided Meditation  - 7 x weekly - 15 Minute Guided Meditation  - 7 x weekly - 20 Minute Guided Meditation  - 7 x weekly  Patient Education - Treating Persistent Pain Without Medications: Mindfulness  ASSESSMENT:  CLINICAL IMPRESSION: ***Patient arrives with continued complaints of elbow, low back, and Lt ankle/foot pain. She also reports GI issues since last session that are being addressed by her GI specialist. We focused on addressing elbow pain today with manual work and gentle strengthening. She reported no elbow pain at conclusion of session.   EVAL: Patient is a 78 y.o. female who was seen today for physical therapy evaluation and treatment for s/p Lt ankle arthroscopy Brostrom stabilization, peroneus brevis and longus debridement and tenosynovectomy on 03/12/24. She demonstrates ROM, strength, gait and balance deficits that are consistent with her recent post-operative status. She will benefit from skilled PT to address the above stated deficits in order to return to optimal function.   OBJECTIVE IMPAIRMENTS: Abnormal gait, decreased activity tolerance, decreased balance, decreased endurance, decreased knowledge of condition, decreased mobility, difficulty walking, decreased ROM, decreased strength, increased edema, impaired flexibility, impaired sensation, improper body mechanics, postural dysfunction, and pain.   GOALS:  Goals reviewed with patient? Yes SHORT TERM GOALS: Target date: 11/19/2024 (updated 10/29/24)   Patient will demonstrate use of TENS unit for home to manage chronic low back pain. Baseline: to bring next visit 10/29/24: independent use of TENS unit as needed for pain  Goal status: MET  2.  Patient will improve 5 time sit<>stand to < or equal  to 18 seconds.  Baseline: 22.53 seconds 10/29/24: 19.9 seconds  Goal status: PROGRESSING  LONG TERM GOALS: Target date: 12/13/24 (updated 10/29/24)   Patient will score >/= 60/104 on the Foot and Ankle Disability Index (MDC 7) to signify clinically meaningful improvement in functional abilities.  Baseline: see above Goal status: MET  2.  Patient will maintain tandem stance for at least 10 seconds to improve gait stability.  Baseline: see above 06/26/24: tandem 1 second 08/04/24: tandem 4 second 10/29/24: tandem 12 seconds  Goal status: MET   3.  Patient will be modified independent with curb/stair negotiation.  Baseline: uses cane and requires support from spouse  06/26/24: requires cane  Goal status: MET  4.  Patient will self-report ability to tolerate at least 30 minutes of standing/walking activity.  Baseline: 10 minutes  06/26/24: 20 minutes  08/04/24: limited due to back pain and dizziness, 10 minutes  10/29/24: 20-30 minutes  Goal status: MET  5.  Patient will demonstrate at least 4/5 Lt ankle strength to improve stability when navigating on uneven terrain.  Baseline: see above  Goal status: MET  6.  Patient will be independent with advanced home program to progress/maintain current level of function.  Baseline: initial HEP issued  Goal status: MET   7. Patient will tolerate rolling R and L without d/o dizziness.   Baseline: 4/10 dizzines 08/04/24: 4/10 dizziness baseline    Goal status: MET 09/22/24: baseline dizziness today is 1/10  (began again this weekend) Rolling is so minute I don't think it's dizziness   8. Patient will tolerate gaze x 1 viewing in sitting x 20 seconds to work towards improved tolerance to turns in standing.   Baseline: Unable to do 2 reps in sitting   08/04/24: VOR x 1, 20 seconds    Goal status: MET   9. Patient will score </= 40 % disability on the Oswestry Disability Index (MCID is 20%) to signify clinically meaningful improvement in functional  abilities.    Baseline: see above   Goal status: PROGRESSING   10. Patient will demonstrate proper lifting mechanics to improve her tolerance to loading/unloading the dishwasher.    Baseline: increased pain, limits bending activity   10/29/24: continues to not lift at home due to pain.    Goal status: PROGRESSING  11. Patient will be indep with final HEP progression.  Baseline: has HEP will need progression Goal status: PROGRESSING   12. Patient will demonstrate at least 4+/5 bilateral elbow and wrist strength to improve ability to carry items.  Baseline: see above  Goal Status: NEW  PLAN:  PT FREQUENCY: 1-2x/week  PT DURATION: 6 weeks  PLANNED INTERVENTIONS: 97164- PT Re-evaluation, 97750- Physical Performance Testing, 97110-Therapeutic exercises, 97530- Therapeutic activity, [id number]- Neuromuscular re-education, 97535- Self Care, [id number]- Manual therapy, [phone]- Gait training, [phone]- Aquatic Therapy, [phone]- Vasopneumatic device, [phone] (1-2 muscles), [id number] (3+ muscles)- Dry Needling, Taping, Cryotherapy, and Moist heat  PLAN FOR NEXT SESSION: review and progress HEP; focus on generalized strengthening and working towards independent home program. Architectural technologist, functional strengthening and balance activity  ***  Nash Bolls, PT 11/18/24 8:56 AM

## 2024-11-24 ENCOUNTER — Telehealth: Payer: Self-pay

## 2024-11-24 NOTE — Telephone Encounter (Signed)
 Copied from CRM [id number]. Topic: Clinical - Lab/Test Results >> Nov 24, 2024 11:12 AM Delon DASEN wrote: Reason for CRM: Patient waiting to hear about CT appt for possible lung nodule- [phone]

## 2024-11-24 NOTE — Telephone Encounter (Signed)
 Spoke w/ Pt- informed she should be contacted by imaging, however telephone number given to her.

## 2024-11-25 ENCOUNTER — Encounter: Payer: Self-pay | Admitting: Professional

## 2024-11-25 ENCOUNTER — Ambulatory Visit (HOSPITAL_BASED_OUTPATIENT_CLINIC_OR_DEPARTMENT_OTHER): Admission: RE | Admit: 2024-11-25 | Discharge: 2024-11-25 | Attending: Medical | Admitting: Medical

## 2024-11-25 ENCOUNTER — Ambulatory Visit: Admitting: Professional

## 2024-11-25 DIAGNOSIS — J439 Emphysema, unspecified: Secondary | ICD-10-CM | POA: Diagnosis not present

## 2024-11-25 DIAGNOSIS — R911 Solitary pulmonary nodule: Secondary | ICD-10-CM | POA: Insufficient documentation

## 2024-11-25 DIAGNOSIS — J8410 Pulmonary fibrosis, unspecified: Secondary | ICD-10-CM | POA: Diagnosis not present

## 2024-11-25 DIAGNOSIS — F411 Generalized anxiety disorder: Secondary | ICD-10-CM | POA: Diagnosis not present

## 2024-11-25 DIAGNOSIS — F4310 Post-traumatic stress disorder, unspecified: Secondary | ICD-10-CM | POA: Diagnosis not present

## 2024-11-25 DIAGNOSIS — F331 Major depressive disorder, recurrent, moderate: Secondary | ICD-10-CM | POA: Diagnosis not present

## 2024-11-25 NOTE — Progress Notes (Signed)
 [facility] Counselor/Therapist Progress Note  Patient ID: [name], MRN: [id number],    Date: 11/25/2024  Time Spent: 75 minutes 1003-1118am   Treatment Type: Individual Therapy  Risk Assessment: Danger to Self:  No Self-injurious Behavior: No Danger to Others: No  Subjective: This session was held via video teletherapy. The patient consented to video teletherapy and was located in her home during this session. She is aware it is the responsibility of the patient to secure confidentiality on her end of the session. The provider was in a private home office for the duration of this session.    The patient arrived on time for the [name] appointment.   Issues addressed: 1-physical -pt reports she's not good -at first she was told it was walking pneumonia -they found a nodule and she has a CT scan this afternoon 2-she told Rico about removing her money -Shelly did not handle it well -Shelly moved in with a guy and they got a house together and neither one can afford -Shelly left about a year and a half ago and she left and then came back because she could not find anything on her own -she knows what she needs to do with [facility] but she feels confused and having such a struggle b-under what conditions would she need to follow through on to get the money 3-she talked to [name] and she responded as patient thought -[name] said that it is fine and she needs to take care of herself c-emotionally fragile due to daughter Shelly's response -Clinician provided pt some insights into potential opportunities to address Shelly -pt appears calmer and admits to feeling calmer since discussing  2025 Treatment Plan Problems: Anxiety, Chronic Pain Symptoms: Excessive and/or unrealistic worry that is difficult to control occurring more days than not for at least 6 months about a number of events or activities. Hypervigilance (e.g., feeling constantly on edge, experiencing  concentration difficulties, having trouble falling or staying asleep, exhibiting a general state of irritability). Experiences an increase in general physical discomfort (e.g., fatigue, night sweats, insomnia, muscle tension, body aches). Experiences back or neck pain, interstitial cystitis, or diabetic neuropathy. Complains of generalized pain in many joints, muscles, and bones that debilitates normal functioning. Goals: Enhance ability to effectively cope with the full variety of life's worries and anxieties. Acquire and utilize the necessary pain management skills. Accept the chronic pain and move on with life as much as possible. Find an escape route from the pain. Objectives target date for all objectives is 10/22/2025: Engage in positive self-talk as an alternative to the depressing, negative thoughts about self and the world. Learn and implement specific coping skills as well as when and how to use them to manage pain and its consequences. Learn and implement calming skills such as relaxation, biofeedback, or mindfulness meditation to ease pain. Learn mental coping skills and implement with somatic skills for managing acute pain. Interventions: Assign the client to read about progressive muscle relaxation and other calming strategies in relevant books or treatment manuals (e.g., The Relaxation and Stress Reduction Workbook by Nicholaus Aw, and [place]; Living Beyond Your Pain by Doreene and [place]). Identify areas in the client's life where he/she can implement skills learned through relaxation or biofeedback. Teach the client distraction techniques (e.g., pleasant imagery, counting techniques, alternative focal point) and how to use them with relaxation skills for the management of acute episodes of pain (or assign Controlling the Focus on Physical Problems in the Adult Psychotherapy Homework Planner by Jenniffer).  Teach the client specific coping skills based on an assessment of  need (e.g., problem-solving, social/communication, conflict resolution, goal-setting). Assist the client in reframing thoughts about his/her life as one that has many positive elements outside of the pain; ask him/her to list positive aspects of himself/herself as well as his life circumstances (or assign Positive Self-Talk and/or What's Good about Me and My Life? in the Adult Psychotherapy Homework Planner by Jenniffer).  Diagnosis:Major depressive disorder, recurrent episode, moderate (HCC)  Generalized anxiety disorder  PTSD (post-traumatic stress disorder)  Plan:  - -meet on Tuesday, December 09, 2024 at [facility].

## 2024-11-27 ENCOUNTER — Telehealth: Payer: Self-pay

## 2024-11-27 ENCOUNTER — Ambulatory Visit

## 2024-11-27 DIAGNOSIS — J4489 Other specified chronic obstructive pulmonary disease: Secondary | ICD-10-CM

## 2024-11-27 DIAGNOSIS — L570 Actinic keratosis: Secondary | ICD-10-CM | POA: Diagnosis not present

## 2024-11-27 DIAGNOSIS — L821 Other seborrheic keratosis: Secondary | ICD-10-CM | POA: Diagnosis not present

## 2024-11-27 DIAGNOSIS — L700 Acne vulgaris: Secondary | ICD-10-CM | POA: Diagnosis not present

## 2024-11-27 LAB — PULMONARY FUNCTION TEST
DL/VA % pred: 84 %
DL/VA: 3.44 ml/min/mmHg/L
DLCO unc % pred: 63 %
DLCO unc: 12.24 ml/min/mmHg
FEF 25-75 Pre: 0.45 L/s
FEF2575-%Pred-Pre: 30 %
FEV1-%Pred-Pre: 50 %
FEV1-Pre: 1.01 L
FEV1FVC-%Pred-Pre: 77 %
FEV6-%Pred-Pre: 67 %
FEV6-Pre: 1.72 L
FEV6FVC-%Pred-Pre: 103 %
FVC-%Pred-Pre: 64 %
FVC-Pre: 1.76 L
Pre FEV1/FVC ratio: 58 %
Pre FEV6/FVC Ratio: 98 %
RV % pred: 165 %
RV: 3.97 L
TLC % pred: 114 %
TLC: 5.86 L

## 2024-11-27 NOTE — Telephone Encounter (Signed)
 Copied from CRM [id number]. Topic: Clinical - Lab/Test Results >> Nov 27, 2024  9:29 AM Wess RAMAN wrote: Reason for CRM: Patient would like to know her ct scan results of the lung from Tuesday.  Callback #: [id number]

## 2024-11-27 NOTE — Telephone Encounter (Signed)
 Results pending. CT ordered by [place].

## 2024-11-27 NOTE — Progress Notes (Signed)
 Full pft w/o post performed today

## 2024-11-27 NOTE — Patient Instructions (Signed)
 Full pft w/o post performed today

## 2024-12-01 ENCOUNTER — Encounter: Payer: Self-pay | Admitting: Professional

## 2024-12-01 ENCOUNTER — Ambulatory Visit: Admitting: Adult Health

## 2024-12-01 ENCOUNTER — Ambulatory Visit: Admitting: Professional

## 2024-12-01 ENCOUNTER — Encounter: Payer: Self-pay | Admitting: Adult Health

## 2024-12-01 VITALS — BP 95/63 | HR 76 | Temp 97.9°F | Ht 65.0 in | Wt 129.4 lb

## 2024-12-01 DIAGNOSIS — K219 Gastro-esophageal reflux disease without esophagitis: Secondary | ICD-10-CM

## 2024-12-01 DIAGNOSIS — F411 Generalized anxiety disorder: Secondary | ICD-10-CM | POA: Diagnosis not present

## 2024-12-01 DIAGNOSIS — F4310 Post-traumatic stress disorder, unspecified: Secondary | ICD-10-CM | POA: Diagnosis not present

## 2024-12-01 DIAGNOSIS — J4489 Other specified chronic obstructive pulmonary disease: Secondary | ICD-10-CM

## 2024-12-01 DIAGNOSIS — F331 Major depressive disorder, recurrent, moderate: Secondary | ICD-10-CM

## 2024-12-01 DIAGNOSIS — J309 Allergic rhinitis, unspecified: Secondary | ICD-10-CM

## 2024-12-01 DIAGNOSIS — R42 Dizziness and giddiness: Secondary | ICD-10-CM | POA: Diagnosis not present

## 2024-12-01 DIAGNOSIS — J449 Chronic obstructive pulmonary disease, unspecified: Secondary | ICD-10-CM | POA: Diagnosis not present

## 2024-12-01 DIAGNOSIS — Z87891 Personal history of nicotine dependence: Secondary | ICD-10-CM

## 2024-12-01 DIAGNOSIS — R918 Other nonspecific abnormal finding of lung field: Secondary | ICD-10-CM | POA: Diagnosis not present

## 2024-12-01 DIAGNOSIS — R911 Solitary pulmonary nodule: Secondary | ICD-10-CM

## 2024-12-01 NOTE — Patient Instructions (Addendum)
 Restart Stiolto 2 puffs daily.  Albuterol  inhaler As needed   Activity as tolerated.  Follow up with PCP for CT chest results.  GERD diet.  Follow up with Dr. Shelah  in 6 months and As needed

## 2024-12-01 NOTE — Progress Notes (Signed)
 [facility] Counselor/Therapist Progress Note  Patient ID: [name], MRN: [id number],    Date: 11/25/2024  Time Spent: 69 minutes 1003-112am   Treatment Type: Individual Therapy  Risk Assessment: Danger to Self:  No Self-injurious Behavior: No Danger to Others: No  Subjective: This session was held via video teletherapy. The patient consented to video teletherapy and was located in her home during this session. She is aware it is the responsibility of the patient to secure confidentiality on her end of the session. The provider was in a private home office for the duration of this session.    The patient arrived on time for the [name] appointment.   Issues addressed: 1-physical -pt reports she's not good -at first she was told it was walking pneumonia -they found a nodule and she has a CT scan this afternoon 2-she told Shelly about removing her money a-texted Rico to call b-pt was excited to share with her that she thinks she has a solution -she shared that she talked in counseling about potential things they could do -her daughter became enraged and cussed at her mother and told her that she is lying to the therapist and that the therapist must be stupid to believe her c-she talked with [name] in the evening at her request -[name] stated she knows there is more than one side to every story and she wanted to hear the pt's side -pt started by sharing that she doesn't know what she has done and if she needs to apologize -[name] then told her that she was not helping and that Rico was in a bad space -[name] fussed at [name] as well but thanked him for taking care of her mother -[name] continued being hurtful toward her mother and the pt told her she would hang up the phone and she did so -she and [name] are make the peace people and they have decided early on that they would not argue because neither of them like confrontation and to remain calm -pt admits  that she did not remain calm with her daughters related to this subject -her children say she lies when she says she cannot remember their childhood  2025 Treatment Plan Problems: Anxiety, Chronic Pain Symptoms: Excessive and/or unrealistic worry that is difficult to control occurring more days than not for at least 6 months about a number of events or activities. Hypervigilance (e.g., feeling constantly on edge, experiencing concentration difficulties, having trouble falling or staying asleep, exhibiting a general state of irritability). Experiences an increase in general physical discomfort (e.g., fatigue, night sweats, insomnia, muscle tension, body aches). Experiences back or neck pain, interstitial cystitis, or diabetic neuropathy. Complains of generalized pain in many joints, muscles, and bones that debilitates normal functioning. Goals: Enhance ability to effectively cope with the full variety of life's worries and anxieties. Acquire and utilize the necessary pain management skills. Accept the chronic pain and move on with life as much as possible. Find an escape route from the pain. Objectives target date for all objectives is 10/22/2025: Engage in positive self-talk as an alternative to the depressing, negative thoughts about self and the world. Learn and implement specific coping skills as well as when and how to use them to manage pain and its consequences. Learn and implement calming skills such as relaxation, biofeedback, or mindfulness meditation to ease pain. Learn mental coping skills and implement with somatic skills for managing acute pain. Interventions: Assign the client to read about progressive muscle relaxation and other calming  strategies in relevant books or treatment manuals (e.g., The Relaxation and Stress Reduction Workbook by Nicholaus Aw, and [place]; Living Beyond Your Pain by Doreene and [place]). Identify areas in the client's life where he/she can  implement skills learned through relaxation or biofeedback. Teach the client distraction techniques (e.g., pleasant imagery, counting techniques, alternative focal point) and how to use them with relaxation skills for the management of acute episodes of pain (or assign Controlling the Focus on Physical Problems in the Adult Psychotherapy Homework Planner by Jenniffer). Teach the client specific coping skills based on an assessment of need (e.g., problem-solving, social/communication, conflict resolution, goal-setting). Assist the client in reframing thoughts about his/her life as one that has many positive elements outside of the pain; ask him/her to list positive aspects of himself/herself as well as his life circumstances (or assign Positive Self-Talk and/or What's Good about Me and My Life? in the Adult Psychotherapy Homework Planner by Jenniffer).  Diagnosis:Major depressive disorder, recurrent episode, moderate (HCC)  Generalized anxiety disorder  PTSD (post-traumatic stress disorder)  Plan:  -become more involved in ministry opporutnities. -meet on Tuesday, December 09, 2024 at [facility].

## 2024-12-01 NOTE — Progress Notes (Signed)
 @Patient  ID: [name], female    DOB: [birth year], 78 y.o.   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  Discussed the use of AI scribe software for clinical note transcription with the patient, who gave verbal consent to proceed.  History of Present Illness [name] is a 78 year old female with moderate to severe COPD who presents for follow-up of her respiratory condition.  Last visit patient was having a COPD exacerbation.  She was treated with she has experienced increased episodes of bronchitis and was previously treated with a Z-Pak and started on Stiolto.  Patient says she continued to have some ongoing symptoms.  Was recently seen by her primary care provider and given antibiotics and steroids which she says has been very effective.  Cough and congestion have decreased.  She says she is not taking Stiolto on a regular basis.  Felt that it could cause some nausea.  Patient was set up for pulmonary function testing that was completed on November 27, 2024 that showed stable lung function with FEV1 at 50%, ratio 58, FVC 64%, DLCO 63%.  Recently seen by primary care and chest x-ray showed a nodular density.  CT chest is pending. She denies any unintentional weight loss or hemoptysis. Patient says she has significant  reflux.  Is controlled currently on aggressive GERD regimen     Allergies  Allergen Reactions   Clindamycin Hcl Shortness Of Breath and Rash   Cortisone Hives, Other (See Comments) and Swelling   Levofloxacin  Rash    Other reaction(s): sick, rash hives severe itching   Other Other (See Comments)    Father had malignant hyperthermia   Penicillins Anaphylaxis   Rosuvastatin  Anaphylaxis   Baclofen  Rash   Codeine Hives and Nausea Only   Erythromycin Hives   Haemophilus Influenzae Vaccines Other (See Comments)    Local reaction at site   Latex Hives   Lincomycin Other (See Comments)   Pentazocine Lactate Other (See Comments)    HALLUCINATION   Pneumococcal Vaccine Polyvalent Hives, Swelling and Other (See Comments)    REACTION: swelling at injection site   Prednisone  Rash    Other reaction(s): Rash, Hives - can tolerate if she takes with benadryl     Tamoxifen  Nausea And Vomiting   Influenza Vaccine Recombinant     Other Reaction(s): Not available   Ketorolac      Other Reaction(s): Not available   Selenium    Seroquel  [Quetiapine ] Dermatitis   Doxycycline  Rash   Fluzone [Influenza Virus Vaccine] Hives    Local reaction at the site   Haemophilus Influenzae Other (See Comments)    Local reaction at the site     Immunization History  Administered Date(s) Administered   Influenza Split 10/10/2011, 09/24/2012   Influenza Whole 10/18/2010   Influenza,inj,Quad PF,6+ Mos 10/13/2013, 09/25/2014   PFIZER(Purple Top)SARS-COV-2 Vaccination 01/29/2020,  02/23/2020, 09/18/2020, 07/25/2021   Pneumococcal Polysaccharide-23 01/27/2011   Respiratory Syncytial Virus Vaccine ,Recomb Aduvanted(Arexvy ) 10/20/2024   Tdap 10/21/2013, 06/01/2018   Zoster Recombinant(Shingrix ) 07/14/2019, 10/16/2019    Past Medical History:  Diagnosis Date   Abdominal pain 10/26/2013   Allergic rhinitis 01/25/2016   Allergy     ANA positive 07/29/2013   Anemia    Arthralgia 08/18/2013   Arthritis     Asthma, allergic 08/05/2012   Atrial flutter    past history- not current   Atypical chest pain 01/01/2015   Bilateral carpal tunnel syndrome 03/04/2015   CAD in native artery 08/25/2010   Minor CAD 2009, 2011, low risk Myoview  2013 and 2016        Cataract    bilaterally removed    Cellulitis of breast 02/05/2023   Chronic constipation    Chronic cough 01/09/2017   Chronic interstitial cystitis 02/01/2017   Chronic night sweats 01/08/2015   Common bile duct dilatation 02/13/2023   Concussion    x 3   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   DDD (degenerative disc disease), cervical 09/17/2018   Degenerative scoliosis 04/22/2018   Deviated septum 05/12/2016   Dysphagia    Dyspnea    Dysuria 03/09/2021   Elevated alkaline phosphatase level 01/25/2023   Essential hypertension 08/25/2010   External hemorrhoids 02/05/2015   Facial trauma 01/22/2023   Family history of adverse reaction to anesthesia    Family history of malignant hyperthermia    father had this   Fibrocystic breast disease 10/18/2010   Fibromyalgia    Frequency of urination    Generalized anxiety disorder 08/03/2010   GERD (gastroesophageal reflux disease)    Headache 10/13/2013   High serum vitamin D  05/18/2021   History of chronic bronchitis    History of colonic polyp 02/25/2019   History of hiatal hernia    History of tachycardia    CONTROLLED  WITH ATENOLOL    Hyperglycemia 03/08/2021   Hyperkalemia 09/28/2022   Hyperlipidemia    Intertrigo 12/13/2022   Kidney stone 02/26/2019   Knee pain, right 04/30/2015   Left hip pain 04/30/2015   Leg pain 02/24/2011   Qualifier: Diagnosis of   By: Antonio DOJamee         Leukocytosis 07/05/2022   Local reaction to hymenoptera sting 05/09/2023   Low back pain 09/08/2020   Major depressive disorder 02/05/2013   Malignant neoplasm of lower-outer quadrant of right breast of female, estrogen receptor positive 01/03/2013   Nasal turbinate hypertrophy 05/12/2016    Oral herpes 02/05/2023   OSA and COPD overlap syndrome 06/10/2020   Osteoporosis 01/2019   T score -2.2 stable/improved from prior study   Pelvic pain    Peripheral neuropathy 05/22/2014   Personal history of radiation therapy    Pneumonia    Primary insomnia 06/25/2017   Pulmonary nodules 02/12/2015   Rash and other nonspecific skin eruption 05/09/2023   Recurrent falls 05/02/2020   Recurrent sinusitis 02/25/2019   Right arm pain 07/20/2016   RLS (restless legs syndrome) 11/09/2019   RUQ pain 07/05/2022   S/P radiation therapy 11/12/12 - 12/05/12   right Breast   Sciatica 04/30/2015   Sepsis 2014   from UTI    Sjogren's disease 12/12/2021   Splenic lesion 09/02/2012   Sprain of lateral ligament of ankle joint 07/31/2023   Stage 3 chronic kidney disease    Statin intolerance 02/29/2020   Stricture and stenosis of esophagus 10/19/2011   Tongue lesion 02/05/2023   Tremor 07/05/2022  Urgency of urination    Vertigo 01/22/2023   Vitamin B12 deficiency 03/08/2021   Vocal cord cyst 05/18/2021    Tobacco History: Social History   Tobacco Use  Smoking Status Former   Current packs/day: 0.00   Average packs/day: 1 pack/day for 15.0 years (15.0 ttl pk-yrs)   Types: Cigarettes   Start date: 05/27/1990   Quit date: 05/27/2005   Years since quitting: 19.5   Passive exposure: Never  Smokeless Tobacco Never   Counseling given: Not Answered   Outpatient Medications Prior to Visit  Medication Sig Dispense Refill   acetaminophen  (TYLENOL ) 500 MG tablet Take 1,000 mg by mouth every 6 (six) hours as needed for mild pain or headache.      albuterol  (VENTOLIN  HFA) 108 (90 Base) MCG/ACT inhaler Inhale 2 puffs into the lungs every 4 (four) hours as needed for wheezing or shortness of breath (coughing fits). 18 g 1   amLODipine  (NORVASC ) 5 MG tablet Take 1 tablet by mouth once daily 90 tablet 3   aspirin  EC 81 MG tablet Take 1 tablet (81 mg total) by mouth daily. 90 tablet 3   atenolol   (TENORMIN ) 25 MG tablet Take 1 tablet (25 mg total) by mouth 2 (two) times daily. 180 tablet 1   carboxymethylcellul-glycerin (OPTIVE) 0.5-0.9 % ophthalmic solution Place 1 drop into both eyes daily as needed for dry eyes.     cyanocobalamin  (VITAMIN B12) 1000 MCG tablet Take 1,000 mg by mouth daily with breakfast.     diclofenac Sodium (VOLTAREN) 1 % GEL Apply 2 g topically daily as needed (Back pain).     DULoxetine  (CYMBALTA ) 60 MG capsule Take 1 capsule by mouth twice daily 180 capsule 0   EPINEPHrine  0.3 mg/0.3 mL IJ SOAJ injection Inject 0.3 mg into the muscle as needed for anaphylaxis. 2 each 1   estradiol  (ESTRACE ) 0.01 % CREA vaginal cream Place 1 Applicatorful vaginally 3 (three) times a week.     fluticasone  (FLONASE ) 50 MCG/ACT nasal spray Place 2 sprays into both nostrils daily. 16 g 5   folic acid  (FOLVITE ) 1 MG tablet Take 1 tablet (1 mg total) by mouth daily.     furosemide  (LASIX ) 20 MG tablet TAKE 1 TABLET BY MOUTH AS NEEDED FOR EDEMA (Patient taking differently: Take 20 mg by mouth. Once every 3 days) 90 tablet 3   gabapentin  (NEURONTIN ) 300 MG capsule Take 2 capsules (600 mg total) by mouth in the morning AND 1 capsule (300 mg total) every evening AND 3 capsules (900 mg total) at bedtime. 180 capsule 1   hydrOXYzine  (ATARAX ) 10 MG tablet Take 0.5-2 tablets (5-20 mg total) by mouth 3 (three) times daily as needed for anxiety (not with Loratadine ). 90 tablet 2   linaclotide  (LINZESS ) 145 MCG CAPS capsule Take 1 capsule (145 mcg total) by mouth daily before breakfast. 30 capsule 5   nitroGLYCERIN  (NITROSTAT ) 0.4 MG SL tablet Place 1 tablet (0.4 mg total) under the tongue every 5 (five) minutes x 3 doses as needed for chest pain. 25 tablet 2   pantoprazole  (PROTONIX ) 40 MG tablet Take 1 tablet by mouth 2 (two) times daily. (Patient taking differently: Take 1 tablet by mouth daily.)     polyethylene glycol (MIRALAX ) 17 g packet Take 17 g by mouth 2 (two) times daily. 14 each 0    REPATHA  SURECLICK 140 MG/ML SOAJ INJECT CONTENTS OF 1 PEN SUBCUTANEOUSLY EVERY 14 DAYS 6 mL 0   sucralfate  (CARAFATE ) 1 GM/10ML suspension Take 10 mLs (  1 g total) by mouth 4 (four) times daily -  with meals and at bedtime. 420 mL 1   Tiotropium Bromide-Olodaterol (STIOLTO RESPIMAT ) 2.5-2.5 MCG/ACT AERS Inhale 2 puffs into the lungs daily. 1 each 5   traMADol  (ULTRAM ) 50 MG tablet Take 1 tablet by mouth 3 (three) times daily as needed.     traZODone  (DESYREL ) 100 MG tablet Take 1 tablet (100 mg total) by mouth at bedtime. 90 tablet 1   Vitamin D , Ergocalciferol , (DRISDOL ) 1.25 MG (50000 UNIT) CAPS capsule Take 1 capsule by mouth once a week 4 capsule 0   fluticasone  (FLOVENT  HFA) 220 MCG/ACT inhaler Inhale 1-2 puffs into the lungs in the morning and at bedtime. 1 each 12   nystatin  cream (MYCOSTATIN ) Apply 1 application topically 2 (two) times daily. (Patient not taking: Reported on 12/01/2024) 30 g 2   RSV vaccine recomb adjuvanted (AREXVY ) 120 MCG/0.5ML injection Inject into the muscle. (Patient not taking: Reported on 12/01/2024) 1 each 0   Tiotropium Bromide-Olodaterol (STIOLTO RESPIMAT ) 2.5-2.5 MCG/ACT AERS 1 sample (Patient not taking: Reported on 12/01/2024)     predniSONE  (STERAPRED UNI-PAK 21 TAB) 10 MG (21) TBPK tablet Taper over 6 days. (Patient not taking: Reported on 12/01/2024) 21 tablet 0   No facility-administered medications prior to visit.     Review of Systems:   Constitutional:   No  weight loss, night sweats,  Fevers, chills,+ fatigue, or  lassitude.  HEENT:   No headaches,  Difficulty swallowing,  Tooth/dental problems, or  Sore throat,                No sneezing, itching, ear ache, nasal congestion, post nasal drip,   CV:  No chest pain,  Orthopnea, PND, swelling in lower extremities, anasarca, dizziness, palpitations, syncope.   GI  No  bloody stools.   Resp: No shortness of breath with exertion or at rest.  No excess mucus, no productive cough,  No non-productive cough,   No coughing up of blood.  No change in color of mucus.  No wheezing.  No chest wall deformity  Skin: no rash or lesions.  GU: no dysuria, change in color of urine, no urgency or frequency.  No flank pain, no hematuria   MS:  No joint pain or swelling.  No decreased range of motion.  No back pain.    Physical Exam  BP 95/63   Pulse 76   Temp 97.9 F (36.6 C)   Ht 5' 5 (1.651 m) Comment: Per pt  Wt 129 lb 6.4 oz (58.7 kg)   SpO2 95% Comment: RA  BMI 21.53 kg/m   GEN: A/Ox3; pleasant , NAD    HEENT:  [place]/AT,  NOSE-clear, THROAT-clear, no lesions, no postnasal drip or exudate noted.   NECK:  Supple w/ fair ROM; no JVD; normal carotid impulses w/o bruits; no thyromegaly or nodules palpated; no lymphadenopathy.    RESP  Clear  P & A; w/o, wheezes/ rales/ or rhonchi. no accessory muscle use, no dullness to percussion  CARD:  RRR, no m/r/g, no peripheral edema, pulses intact, no cyanosis or clubbing.  GI:   Soft & nt; nml bowel sounds; no organomegaly or masses detected.   Musco: Warm bil, no deformities or joint swelling noted.   Neuro: alert, no focal deficits noted.    Skin: Warm, no lesions or rashes    Lab Results:Reviewed 12/01/2024   CBC    Component Value Date/Time   WBC 7.1 11/18/2024 1427   RBC  4.47 11/18/2024 1427   HGB 13.5 11/18/2024 1427   HGB 14.3 03/04/2024 1042   HGB 11.8 05/25/2023 1231   HGB 11.3 (L) 04/20/2015 1050   HCT 41.0 11/18/2024 1427   HCT 36.3 05/25/2023 1231   HCT 36.0 04/20/2015 1050   PLT 297.0 11/18/2024 1427   PLT 264 03/04/2024 1042   PLT 348 05/25/2023 1231   MCV 91.7 11/18/2024 1427   MCV 85 05/25/2023 1231   MCV 90.9 04/20/2015 1050   MCH 30.4 03/04/2024 1042   MCHC 32.9 11/18/2024 1427   RDW 15.5 11/18/2024 1427   RDW 15.8 (H) 05/25/2023 1231   RDW 15.9 (H) 04/20/2015 1050   LYMPHSABS 1.6 11/18/2024 1427   LYMPHSABS 2.1 05/25/2023 1231   LYMPHSABS 1.3 04/20/2015 1050   MONOABS 0.5 11/18/2024 1427   MONOABS 0.5  04/20/2015 1050   EOSABS 0.3 11/18/2024 1427   EOSABS 0.5 (H) 05/25/2023 1231   BASOSABS 0.1 11/18/2024 1427   BASOSABS 0.1 05/25/2023 1231   BASOSABS 0.0 04/20/2015 1050    BMET    Component Value Date/Time   NA 137 11/18/2024 1427   NA 138 05/25/2023 1231   NA 140 04/20/2015 1050   K 4.5 11/18/2024 1427   K 3.9 04/20/2015 1050   CL 101 11/18/2024 1427   CL 103 11/12/2012 1549   CO2 32 11/18/2024 1427   CO2 26 04/20/2015 1050   GLUCOSE 86 11/18/2024 1427   GLUCOSE 82 04/20/2015 1050   GLUCOSE 103 (H) 11/12/2012 1549   BUN 14 11/18/2024 1427   BUN 13 05/25/2023 1231   BUN 13.1 04/20/2015 1050   CREATININE 1.10 11/18/2024 1427   CREATININE 1.23 (H) 03/04/2024 1042   CREATININE 1.1 04/20/2015 1050   CALCIUM  9.2 11/18/2024 1427   CALCIUM  8.9 04/20/2015 1050   GFRNONAA 68 04/07/2024 0733   GFRNONAA 51 (L) 07/27/2014 1227   GFRAA 66 09/03/2018 1114   GFRAA 59 (L) 07/27/2014 1227    BNP No results found for: BNP  ProBNP  Administration History     None          Latest Ref Rng & Units 11/27/2024    3:53 PM 08/18/2021    8:39 AM  PFT Results  FVC-Pre L 1.76  P 1.42   FVC-Predicted Pre % 64  P 50   FVC-Post L  1.94   FVC-Predicted Post %  68   Pre FEV1/FVC % % 58  P 68   Post FEV1/FCV % %  63   FEV1-Pre L 1.01  P 0.96   FEV1-Predicted Pre % 50  P 45   FEV1-Post L  1.22   DLCO uncorrected ml/min/mmHg 12.24  P 13.72   DLCO UNC% % 63  P 70   DLCO corrected ml/min/mmHg  14.33   DLCO COR %Predicted %  73   DLVA Predicted % 84  P 115   TLC L 5.86  P 4.80   TLC % Predicted % 114  P 93   RV % Predicted % 165  P 127     P Preliminary result    No results found for: NITRICOXIDE      No data to display              Assessment & Plan:   Assessment and Plan Assessment & Plan Chronic obstructive pulmonary disease with asthma features  -recent exacerbation now improved.  Lung function appears stable on PFTs.  Encouraged patient on inhaler  compliance.  Would continue on  Stiolto daily Restart Stiolto two puffs once a day. Ensure she eats before using Stiolto and rinses her mouth after use. Monitor for nausea and report if it persists.    Gastroesophageal reflux disease Symptoms of GERD with burning sensation when eating, managed with Carafate . Continue Carafate  for GERD symptoms. GERD diet .   Chronic Dizziness  -follow up with PCP  Stable   Abnormal chest x-ray with nodular density.  CT chest is pending follow-up with primary care referral results  Plan  Patient Instructions  Restart Stiolto 2 puffs daily.  Albuterol  inhaler As needed   Activity as tolerated.  Follow up with PCP for CT chest results.  GERD diet.  Follow up with Dr. Shelah  in 6 months and As needed            Madelin Stank, NP 12/01/2024

## 2024-12-03 NOTE — Telephone Encounter (Signed)
Results are back, please advise.

## 2024-12-05 ENCOUNTER — Telehealth: Payer: Self-pay | Admitting: Adult Health

## 2024-12-05 NOTE — Telephone Encounter (Addendum)
 Message from PCP , CT chest showed areas of pulmonary fibrosis . This will need further evaluation . Can set up with Dr. Shelah  or Shaye Elling NP in 4-6 weeks to discuss .

## 2024-12-09 ENCOUNTER — Encounter: Payer: Self-pay | Admitting: Professional

## 2024-12-09 ENCOUNTER — Ambulatory Visit: Admitting: Professional

## 2024-12-09 DIAGNOSIS — F411 Generalized anxiety disorder: Secondary | ICD-10-CM | POA: Diagnosis not present

## 2024-12-09 DIAGNOSIS — F331 Major depressive disorder, recurrent, moderate: Secondary | ICD-10-CM | POA: Diagnosis not present

## 2024-12-09 DIAGNOSIS — F4310 Post-traumatic stress disorder, unspecified: Secondary | ICD-10-CM

## 2024-12-09 NOTE — Progress Notes (Signed)
 [facility] Neurology Division Clinic Note -progress note   [name] is a 78 y.o. female with extensive and complex medical history including hypertension, hyperlipidemia, multiple drug allergies, polypharmacy including pain medications and muscle relaxers, osteoporosis, depression, right breast cancer status post XRT, chronic pain and fibromyalgia, pulmonary fibrosis, stroke syndrome, COPD, CAD, history of chronic BPPV with subjective memory loss with negative workup and was recently complains of occipital headaches presenting today in follow up.  She was last seen on 05/23/2024 at which time it was felt that headaches were likely multifactorial including worsening anxiety and depression (she had even stated that it was better when the deep mood was better controlled) and iron deficiency anemia  requiring iron infusions and multiple medications.  Occipital headaches of unclear etiology    Update Frequency?  A week, a month *** Quality:  /10  Photophobia? *** Phonophobia? *** Nausea? ***  Confusion? No ***  No confusion is reported.    Working? ***  Heavy lifting? ***  Dizziness? *** Sleeps well?  Sleeps better with trazodone  50 mg at night (in the past she was on Seroquel  but it was discontinued because it was not therapeutic)  Caffeine:  *** Water : *** Exercise: *** Depression:  Denies ***  Current NSAIDS:  Ibuprofen*** Current analgesics:  Tylenol  ***   Current triptans: *** Current ergotamine:  none *** Current anti-emetic:  Zofran  4mg .  ***   Current muscle relaxants:  none *** Current anti-anxiolytic:  none *** Current sleep aide:  none *** Current Antihypertensive medications:  *** Current Antidepressant medications:  *** Current Anticonvulsant medications:  *** Current anti-CGRP:  none *** Current Vitamins/Herbal/Supplements:  *** Current Antihistamines/Decongestants:  no*** Other therapy:  none *** Hormone/birth control:  none *** Other  medications: none . ***    Onset: I have been on and off for long time, I have tension headaches and migraine headaches .  I did actually better 2 months ago I started to do psychotherapy which helped me a lot Quality: Tension  Intensity: Not severe , much better than before Location: In the back of the head towards the left eye and sometimes in the forehead Duration: It hurts more in the middle of the day  Frequency: Every day  Associated symptoms: She reports photophobia at times, sound bothers her when she had these headaches, sometimes I have nausea and vomiting.  [name]: Sometimes I see bright lights Activity: No Aggravating factors: When I lie down it may be worse, I take Tylenol  every 6-8 hours and I alternate with ibuprofen, this seems to help.  She also reports that when anxious it may be worse.  She is doing psychotherapy which seems to be helping her Denies any vision changes.  I just had my eyes checked and I need better glasses . Relieving factors: Medicines , psychotherapy Anemia?  Endorsed Any appetite changes?  Drinks plenty water , tries to do any healthy diet Current abortive medications: Advil Tylenol  every 6-8 hours and I alternate with ibuprofen twice a day, I take baby aspirin , tramadol  and gabapentin  Current prophylactic medications: None   Past abortive medications: None Past prophylactic medications: None   Frequency of medications: Tylenol  or Advil every 6-8 hours.  Tramadol  at night.  Gabapentin   3 times a day Family history:  Smoker: No Alcohol: No Caffeine: No Sleep: Good   Mood?  I have been going to counseling 2 times a week which has helped, she told me helped to be more calm  and peaceful, write everything down, putting in the box and turn it over to the [place]    Past Medical History:  Diagnosis Date   Abdominal pain 10/26/2013   Allergic rhinitis 01/25/2016   Allergy     ANA positive 07/29/2013   Anemia    Arthralgia  08/18/2013   Arthritis    Asthma, allergic 08/05/2012   Atrial flutter    past history- not current   Atypical chest pain 01/01/2015   Bilateral carpal tunnel syndrome 03/04/2015   CAD in native artery 08/25/2010   Minor CAD 2009, 2011, low risk Myoview  2013 and 2016        Cataract    bilaterally removed    Cellulitis of breast 02/05/2023   Chronic constipation    Chronic cough 01/09/2017   Chronic interstitial cystitis 02/01/2017   Chronic night sweats 01/08/2015   Common bile duct dilatation 02/13/2023   Concussion    x 3   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   DDD (degenerative disc disease), cervical 09/17/2018   Degenerative scoliosis 04/22/2018   Deviated septum 05/12/2016   Dysphagia    Dyspnea    Dysuria 03/09/2021   Elevated alkaline phosphatase level 01/25/2023   Essential hypertension 08/25/2010   External hemorrhoids 02/05/2015   Facial trauma 01/22/2023   Family history of adverse reaction to anesthesia    Family history of malignant hyperthermia    father had this   Fibrocystic breast disease 10/18/2010   Fibromyalgia    Frequency of urination    Generalized anxiety disorder 08/03/2010   GERD (gastroesophageal reflux disease)    Headache 10/13/2013   High serum vitamin D  05/18/2021   History of chronic bronchitis    History of colonic polyp 02/25/2019   History of hiatal hernia    History of tachycardia    CONTROLLED  WITH ATENOLOL    Hyperglycemia 03/08/2021   Hyperkalemia 09/28/2022   Hyperlipidemia    Intertrigo 12/13/2022   Kidney stone 02/26/2019   Knee pain, right 04/30/2015   Left hip pain 04/30/2015   Leg pain 02/24/2011   Qualifier: Diagnosis of   By: Antonio DOJamee         Leukocytosis 07/05/2022   Local reaction to hymenoptera sting 05/09/2023   Low back pain 09/08/2020   Major depressive disorder 02/05/2013   Malignant neoplasm of lower-outer quadrant of right breast of female, estrogen receptor positive 01/03/2013   Nasal turbinate  hypertrophy 05/12/2016   Oral herpes 02/05/2023   OSA and COPD overlap syndrome 06/10/2020   Osteoporosis 01/2019   T score -2.2 stable/improved from prior study   Pelvic pain    Peripheral neuropathy 05/22/2014   Personal history of radiation therapy    Pneumonia    Primary insomnia 06/25/2017   Pulmonary nodules 02/12/2015   Rash and other nonspecific skin eruption 05/09/2023   Recurrent falls 05/02/2020   Recurrent sinusitis 02/25/2019   Right arm pain 07/20/2016   RLS (restless legs syndrome) 11/09/2019   RUQ pain 07/05/2022   S/P radiation therapy 11/12/12 - 12/05/12   right Breast   Sciatica 04/30/2015   Sepsis 2014   from UTI    Sjogren's disease 12/12/2021   Splenic lesion 09/02/2012   Sprain of lateral ligament of ankle joint 07/31/2023   Stage 3 chronic kidney disease    Statin intolerance 02/29/2020   Stricture and stenosis of esophagus 10/19/2011   Tongue lesion 02/05/2023   Tremor 07/05/2022   Urgency of urination    Vertigo 01/22/2023  Vitamin B12 deficiency 03/08/2021   Vocal cord cyst 05/18/2021   Allergies[1]  Past Surgical History:  Procedure Laterality Date   24 HOUR PH STUDY N/A 04/03/2016   Procedure: 24 HOUR PH STUDY;  Surgeon: Elspeth Deward Naval, MD;  Location: [facility];  Service: Gastroenterology;  Laterality: N/A;   ANKLE ARTHROSCOPY WITH RECONSTRUCTION Left 03/12/2024   Procedure: ANKLE ARTHROSCOPY WITH OPEN LATERAL ANKLE LIGAMENT STABILIZATION;  Surgeon: Barton Drape, MD;  Location: [facility];  Service: Orthopedics;  Laterality: Left;   BREAST BIOPSY Right 08/23/2012   ADH   BREAST BIOPSY Right 10/11/2012   Ductal Carcinoma   BREAST EXCISIONAL BIOPSY Right 09/04/2013   benign   BREAST LUMPECTOMY Right 10/11/2012   W/ [facility] BX   CARDIAC CATHETERIZATION  09-13-2007  DR MORRIS   WELL-PRESERVED LVF/  DIFFUSE SCATTERED CORONARY CALCIFACATION AND ATHEROSCLEROSIS WITHOUT OBSTRUCTION   CARDIAC CATHETERIZATION   08-04-2010  DR MCALHANY   NON-OBSTRUCTIVE CAD/  pLAD 40%/  oLAD 30%/  mLAD 30%/  pRCA 30%/  EF 60%   CARDIOVASCULAR STRESS TEST  06-18-2012  DR McALHANY   LOW RISK NUCLEAR STUDY/  SMALL FIXED AREA OF MODERATELY DECREASED UPTAKE IN ANTEROSEPTAL WALL WHICH MAY BE ARTIFACTUAL/  NO ISCHEMIA/  EF 68%   COLONOSCOPY  09/29/2010   CYSTOSCOPY     CYSTOSCOPY WITH HYDRODISTENSION AND BIOPSY N/A 03/06/2014   Procedure: CYSTOSCOPY/HYDRODISTENSION/ INSTILATION OF MARCAINE  AND PYRIDIUM ;  Surgeon: Arlena LILLETTE Gal, MD;  Location: [facility];  Service: Urology;  Laterality: N/A;   CYSTOSCOPY/URETEROSCOPY/HOLMIUM LASER/STENT PLACEMENT Left 03/21/2023   Procedure: CYSTOSCOPY LEFT RETROGRADE PYELOGRAM, LEFT URETEROSCOPY, HOLMIUM LASER LITHOTRIPSYM, AND LEFT URETERAL STENT PLACEMENT;  Surgeon: Devere Lonni Righter, MD;  Location: [facility];  Service: Urology;  Laterality: Left;  60 MINUTES   ESOPHAGEAL MANOMETRY N/A 04/03/2016   Procedure: ESOPHAGEAL MANOMETRY (EM);  Surgeon: Elspeth Deward Naval, MD;  Location: [facility];  Service: Gastroenterology;  Laterality: N/A;   EXTRACORPOREAL SHOCK WAVE LITHOTRIPSY Left 02/06/2019   Procedure: EXTRACORPOREAL SHOCK WAVE LITHOTRIPSY (ESWL);  Surgeon: Ottelin, Mark, MD;  Location: [facility];  Service: Urology;  Laterality: Left;   NASAL SINUS SURGERY  1985   ORIF RIGHT ANKLE  FX  2006   POLYPECTOMY     REMOVAL VOCAL CORD CYST  01/2013   REPAIR OF PERONEUS BREVIS TENDON Left 03/12/2024   Procedure: REPAIR OF PERONEUS TENDON;  Surgeon: Barton Drape, MD;  Location: [facility];  Service: Orthopedics;  Laterality: Left;   RIGHT BREAST BX  08/23/2012   RIGHT HAND SURGERY  X3  LAST ONE 2009   INCLUDES  ORIF RIGHT 5TH FINGER AND REVISION TWICE   SKIN CANCER EXCISION     face,arms   TONSILLECTOMY AND ADENOIDECTOMY  AGE 20   TOTAL ABDOMINAL HYSTERECTOMY W/ BILATERAL SALPINGOOPHORECTOMY  1982   W/  APPENDECTOMY   TRANSTHORACIC  ECHOCARDIOGRAM  06/24/2012   GRADE I DIASTOLIC DYSFUNCTION/  EF 55-60%/  MILD MR   UPPER GASTROINTESTINAL ENDOSCOPY     Social History   Social History Narrative   Lives with husband.   Caffeine use: 1/2 cup per day   Exercise-- 2days a week [employer],  Water  aerobics, walking       Retired engineer, civil (consulting)   No dietary restrictions, tries to maintain a heart healthy diet   Very pleasant lady    Family History  Problem Relation Age of Onset   Rectal cancer Mother    Colon cancer Mother    Pancreatic cancer Mother  Diabetes Mother    Breast cancer Mother 12   Breast cancer Maternal Aunt        breast   Birth defects Maternal Aunt    Irritable bowel syndrome Son    Heart disease Son        CAD, MV replacement   Allergic Disorder Daughter    Diabetes Daughter    Arthritis Daughter    Colon cancer Father    Colon polyps Father    Diabetes Father    Stroke Father    Heart disease Father        CHF   Hyperlipidemia Father    Hypertension Father    Arthritis Father    Breast cancer Paternal Aunt    Breast cancer Paternal Aunt    Arthritis Paternal Uncle    Allergic Disorder Daughter    Heart disease Cousin        CAD, had a blood clot after stenting   Arthritis Sister    Esophageal cancer Neg Hx    Stomach cancer Neg Hx      Scheduled Meds:     CBC    Latest Ref Rng & Units 11/18/2024    2:27 PM 10/08/2024    9:47 AM 07/23/2024    8:51 AM  CBC  WBC 4.0 - 10.5 K/uL 7.1  12.1  9.1   Hemoglobin 12.0 - 15.0 g/dL 86.4  85.7  86.5   Hematocrit 36.0 - 46.0 % 41.0  44.0  40.8   Platelets 150.0 - 400.0 K/uL 297.0  375.0  256.0        Latest Ref Rng & Units 11/18/2024    2:27 PM 10/08/2024    9:47 AM 07/23/2024    8:51 AM  CMP  Glucose 70 - 99 mg/dL 86  892  94   BUN 6 - 23 mg/dL 14  21  12    Creatinine 0.40 - 1.20 mg/dL 8.89  8.63  9.02   Sodium 135 - 145 mEq/L 137  139  142   Potassium 3.5 - 5.1 mEq/L 4.5  3.7  4.4   Chloride 96 - 112 mEq/L 101  97  102   CO2 19 - 32  mEq/L 32  31  32   Calcium  8.4 - 10.5 mg/dL 9.2  9.4  9.4   Total Protein 6.0 - 8.3 g/dL 7.0  6.6  6.2   Total Bilirubin 0.2 - 1.2 mg/dL 0.5  0.7  0.4   Alkaline Phos 39 - 117 U/L 97  96  91   AST 0 - 37 U/L 20  13  15    ALT 0 - 35 U/L 9  10  9          General Medical Exam:   General:  Well appearing, comfortable.   Eyes/ENT: see cranial nerve examination.   Neck:   No carotid bruits. Respiratory:  Clear to auscultation, good air entry bilaterally.   Cardiac:  Regular rate and rhythm, no murmur.   Extremities:  No deformities, edema, or skin discoloration.  Skin:  No rashes or lesions.  Neurological Exam: MENTAL STATUS including orientation to time, place, person, recent and remote memory, attention span and concentration, language, and fund of knowledge is ***normal.  Speech is not dysarthric.  CRANIAL NERVES: II:  No visual field defects.  Unremarkable fundi.   III-IV-VI: Pupils equal round and reactive to light.  Normal conjugate, extra-ocular eye movements in all directions of gaze.  No nystagmus.  No ptosis***.  V:  Normal facial sensation.    VII:  Normal facial symmetry and movements.   VIII:  Normal hearing and vestibular function.   IX-X:  Normal palatal movement.   XI:  Normal shoulder shrug and head rotation.   XII:  Normal tongue strength and range of motion, no deviation or fasciculation.  MOTOR:  No atrophy, fasciculations or abnormal movements.  No pronator drift.    SENSORY:  Normal and symmetric perception of light touch, pinprick, vibration, and proprioception.  Romberg's sign absent.   COORDINATION/GAIT: Normal finger-to- nose-finger and heel-to-shin.  Intact rapid alternating movements bilaterally.  Able to rise from a chair without using arms.  Gait narrow based and stable. Tandem and stressed gait intact.   Assessment/Plan  [name] is a 78 y.o. female presenting for evaluation of headaches. Etiology unclear. Workup in progress     For  preventative management, ***  For abortive therapy, ***  Limit use of pain relievers to no more than 2 days out of week to prevent risk of rebound or medication-overuse headache.  Keep headache diary  Exercise, hydration, caffeine cessation, sleep hygiene, monitor for and avoid triggers  Consider:  magnesium  citrate 400mg  daily, riboflavin 400mg  daily, and coenzyme Q10 100mg  three times daily Always keep in mind that currently taking a hormone or birth control may be a possible trigger or aggravating factor for migraine. Follow up in   months ***   Total time spent:  ***   Thank you for allowing me to participate in patient's care.  If I can answer any additional questions, I would be pleased to do so.    Sincerely,   Camie Sevin, PA-C              [1]  Allergies Allergen Reactions   Clindamycin Hcl Shortness Of Breath and Rash   Cortisone Hives, Other (See Comments) and Swelling   Levofloxacin  Rash    Other reaction(s): sick, rash hives severe itching   Other Other (See Comments)    Father had malignant hyperthermia   Penicillins Anaphylaxis   Rosuvastatin  Anaphylaxis   Baclofen Rash   Codeine Hives and Nausea Only   Erythromycin Hives   Haemophilus Influenzae Vaccines Other (See Comments)    Local reaction at site   Latex Hives   Lincomycin Other (See Comments)   Pentazocine Lactate Other (See Comments)    HALLUCINATION   Pneumococcal Vaccine Polyvalent Hives, Swelling and Other (See Comments)    REACTION: swelling at injection site   Prednisone  Rash    Other reaction(s): Rash, Hives - can tolerate if she takes with benadryl     Tamoxifen  Nausea And Vomiting   Influenza Vaccine Recombinant     Other Reaction(s): Not available   Ketorolac      Other Reaction(s): Not available   Selenium    Seroquel  [Quetiapine ] Dermatitis   Doxycycline  Rash   Fluzone [Influenza Virus Vaccine] Hives    Local reaction at the site   Haemophilus Influenzae Other (See Comments)     Local reaction at the site

## 2024-12-09 NOTE — Progress Notes (Signed)
 [facility] Counselor/Therapist Progress Note  Patient ID: [name], MRN: [id number],    Date: 12/09/2024  Time Spent: 55 minutes 1001-1056am   Treatment Type: Individual Therapy  Risk Assessment: Danger to Self:  No Self-injurious Behavior: No Danger to Others: No  Subjective: This session was held via video teletherapy. The patient consented to video teletherapy and was located in her home during this session. She is aware it is the responsibility of the patient to secure confidentiality on her end of the session. The provider was in a private home office for the duration of this session.    The patient arrived on time for the [name] appointment.   Issues addressed: 1-become more involved in ministry opporutnities. 2-[name] -was supposed to call on Friday but had such a bad day she postponed -pt asked to talk within a week -pt asked how Rico was doing and pt said the conversation would be about pt and her -pt did not want any part of Shelly's mess and that she would have to come out of the hole by herself -[name] shared that she has some things to address from childhood that pt stated that she would like to address -focus on what [name] is saying and not on yourself -[name] was a sickly child and would stay with pt's mother since pt was working -pt feels better mentally and does not have the burden of receiving a call from [name] -pt was not feeling happy talking to her daughters, it was a feeling of what do they want 3-Shelly -has limited supports -she thinks that when she stopped giving money the bottom broke for [facility] and she has learned that it is not her problem -pt's heart is not feeling as heavy 4-physical -pt does have a nodule on her lung, however the bigger issue is that she has been diagnosed with pulmonary fibrosis 5-mood -pt feels a little better -pt feels peace related to her health and relationship with children  2025 Treatment  Plan Problems: Anxiety, Chronic Pain Symptoms: Excessive and/or unrealistic worry that is difficult to control occurring more days than not for at least 6 months about a number of events or activities. Hypervigilance (e.g., feeling constantly on edge, experiencing concentration difficulties, having trouble falling or staying asleep, exhibiting a general state of irritability). Experiences an increase in general physical discomfort (e.g., fatigue, night sweats, insomnia, muscle tension, body aches). Experiences back or neck pain, interstitial cystitis, or diabetic neuropathy. Complains of generalized pain in many joints, muscles, and bones that debilitates normal functioning. Goals: Enhance ability to effectively cope with the full variety of life's worries and anxieties. Acquire and utilize the necessary pain management skills. Accept the chronic pain and move on with life as much as possible. Find an escape route from the pain. Objectives target date for all objectives is 10/22/2025: Engage in positive self-talk as an alternative to the depressing, negative thoughts about self and the world. Learn and implement specific coping skills as well as when and how to use them to manage pain and its consequences. Learn and implement calming skills such as relaxation, biofeedback, or mindfulness meditation to ease pain. Learn mental coping skills and implement with somatic skills for managing acute pain. Interventions: Assign the client to read about progressive muscle relaxation and other calming strategies in relevant books or treatment manuals (e.g., The Relaxation and Stress Reduction Workbook by Nicholaus Aw, and [place]; Living Beyond Your Pain by Doreene and [place]). Identify areas in the client's life where he/she  can implement skills learned through relaxation or biofeedback. Teach the client distraction techniques (e.g., pleasant imagery, counting techniques, alternative focal point)  and how to use them with relaxation skills for the management of acute episodes of pain (or assign Controlling the Focus on Physical Problems in the Adult Psychotherapy Homework Planner by Jenniffer). Teach the client specific coping skills based on an assessment of need (e.g., problem-solving, social/communication, conflict resolution, goal-setting). Assist the client in reframing thoughts about his/her life as one that has many positive elements outside of the pain; ask him/her to list positive aspects of himself/herself as well as his life circumstances (or assign Positive Self-Talk and/or What's Good about Me and My Life? in the Adult Psychotherapy Homework Planner by Jenniffer).  Diagnosis:Major depressive disorder, recurrent episode, moderate (HCC)  Generalized anxiety disorder  PTSD (post-traumatic stress disorder)  Plan:  -conversation with [name], plan to listen to understand -meet on Tuesday, January 06, 2025 at 10am virtual.

## 2024-12-10 ENCOUNTER — Ambulatory Visit: Admitting: Pulmonary Disease

## 2024-12-10 ENCOUNTER — Ambulatory Visit: Admitting: Physician Assistant

## 2024-12-10 DIAGNOSIS — R413 Other amnesia: Secondary | ICD-10-CM | POA: Diagnosis not present

## 2024-12-10 DIAGNOSIS — R4189 Other symptoms and signs involving cognitive functions and awareness: Secondary | ICD-10-CM | POA: Insufficient documentation

## 2024-12-10 NOTE — Patient Instructions (Signed)
 Follow up on May 13 11:30   Neurocognitive testing  MRI brain  Continue your other medicines

## 2024-12-11 ENCOUNTER — Encounter: Payer: Self-pay | Admitting: Emergency Medicine

## 2024-12-11 ENCOUNTER — Ambulatory Visit: Admitting: Emergency Medicine

## 2024-12-11 VITALS — BP 116/60 | HR 82 | Temp 98.0°F | Wt 128.8 lb

## 2024-12-11 DIAGNOSIS — Z87891 Personal history of nicotine dependence: Secondary | ICD-10-CM

## 2024-12-11 DIAGNOSIS — J4489 Other specified chronic obstructive pulmonary disease: Secondary | ICD-10-CM

## 2024-12-11 MED ORDER — ALBUTEROL SULFATE HFA 108 (90 BASE) MCG/ACT IN AERS: 2.0000 | INHALATION_SPRAY | RESPIRATORY_TRACT | 1 refills | Status: AC | PRN

## 2024-12-11 MED ORDER — BREZTRI AEROSPHERE 160-9-4.8 MCG/ACT IN AERO: 2.0000 | INHALATION_SPRAY | Freq: Two times a day (BID) | RESPIRATORY_TRACT | 3 refills | Status: AC

## 2024-12-11 MED ORDER — SPACER/AERO-HOLDING CHAMBERS DEVI: 1.0000 | 0 refills | Status: AC | PRN

## 2024-12-11 NOTE — Progress Notes (Signed)
 Subjective:    Patient ID: [name], female    DOB: 04-Apr-1946, 78 y.o.   MRN: [id number]  Cough   ROV 01/10/2024 --follow-up visit for 78 year old woman with a history of former tobacco use, allergic rhinitis, asthma and chronic cough.  PMH also significant for breast cancer, hypertension, CAD, Sjogren's/fibromyalgia.  Finally she has a history of a vocal cord hemangioma. I saw her in December at which time she was only using her Flovent  on as-needed basis.  We went ahead and tried stopping it to see if she could tolerate.   She is on fluticasone  nasal spray, Atarax , pantoprazole  twice daily for her contributors to cough She reports today she is doing well, although her functional capacity is limited by back pain and fatigue. There is suspicion that she might have myeloma. Her allergies are under control.    Acute OV 12/11/2024 --78 year old woman with a history of former tobacco use, COPD/asthma, chronic cough.  She has allergic rhinitis and a vocal cord hemangioma that likely also contribute to cough.  Past medical history also significant for breast cancer, hypertension, CAD, Sjogren's/fibromyalgia (on Plaquenil in the past, not currently).  Last seen here 12/01/2024 after being treated for bronchitis. She has some central chest pressure, didn't really change after abx and steroids. She is fatigued. Still w some cough although starting to improve. She started stiolto in October - taking every other day because she has stomach side effects.   Pulmonary function testing 11/27/24 reviewed by me show evidence for severe obstruction, hyperinflated lung volumes, decreased diffusion capacity that corrects to the normal range when adjusted for alveolar volume  CT chest 12/02/2024 reviewed by me shows no mediastinal or hilar adenopathy, reticular scar in both apices unchanged from priors diffuse emphysema changes in the lungs.  Comment is made about possible fibrotic change but I do not see any  significant interstitial disease on my evaluation    Review of Systems  Respiratory:  Positive for cough.    As per HPI     Objective:   Physical Exam  Vitals:   12/11/24 1441  BP: 116/60  Pulse: 82  Temp: 98 F (36.7 C)  SpO2: 96%  Weight: 128 lb 12.8 oz (58.4 kg)    Gen: Pleasant, well-nourished, in no distress,  normal affect  ENT: No lesions,  mouth clear,  oropharynx clear, no postnasal drip, somewhat hoarse voice  Neck: No JVD, no stridor  Lungs: No use of accessory muscles, no crackles or wheezing on normal respiration, no wheeze on forced expiration  Cardiovascular: RRR, late systolic murmur with intact S2  Musculoskeletal: No deformities, no cyanosis or clubbing.    Neuro: alert, awake, non focal  Skin: Warm, no lesions or rashes     Assessment & Plan:  COPD with asthma (HCC) She was recently treated for acute bronchitis but I think overall the course has been consistent with progression of her obstructive lung disease.  Her PFT has shown severe obstruction in the past but she has always outperformed the physiology data.  It seems that her breathing is now limiting her significantly.  There was some comment on her CT chest about possible interstitial lung disease or pulmonary fibrosis but I do not see any significant fibrotic component.  This is mostly emphysema.  She needs to be on scheduled BD therapy.  She was started on Stiolto in October but she has not been able to take it consistently because it has caused some GI side effects.  I will try changing her to Breztri  to see if she tolerates.  We will perform a walking oximetry today to ensure that she is not having occult desaturation   We reviewed your pulmonary function testing and your CT scan of the chest today.  These are most consistent with significant COPD. Please stop Stiolto We will try starting Breztri  2 puffs twice a day.  You will use this with a spacer.  Please rinse and gargle after  using. Will give your prescription for albuterol  that you can use 2 puffs up to every 4 hours if needed for shortness of breath, chest tightness, wheezing. We will perform a walking oximetry today to see if you qualify for supplemental oxygen. Follow in our office in 3 months so we can see how you are doing on the inhaled medication.  Call sooner if you have any problems    I personally spent a total of 55 minutes in the care of the patient today including preparing to see the patient, getting/reviewing separately obtained history, performing a medically appropriate exam/evaluation, counseling and educating, placing orders, documenting clinical information in the EHR, independently interpreting results, and communicating results.   Lamar Chris, MD, PhD 12/11/2024, 4:56 PM [facility] [phone] or if no answer before 7:00PM call [phone] For any issues after 7:00PM please call eLink [phone]

## 2024-12-11 NOTE — Assessment & Plan Note (Signed)
 She was recently treated for acute bronchitis but I think overall the course has been consistent with progression of her obstructive lung disease.  Her PFT has shown severe obstruction in the past but she has always outperformed the physiology data.  It seems that her breathing is now limiting her significantly.  There was some comment on her CT chest about possible interstitial lung disease or pulmonary fibrosis but I do not see any significant fibrotic component.  This is mostly emphysema.  She needs to be on scheduled BD therapy.  She was started on Stiolto in October but she has not been able to take it consistently because it has caused some GI side effects.  I will try changing her to Breztri  to see if she tolerates.  We will perform a walking oximetry today to ensure that she is not having occult desaturation   We reviewed your pulmonary function testing and your CT scan of the chest today.  These are most consistent with significant COPD. Please stop Stiolto We will try starting Breztri  2 puffs twice a day.  You will use this with a spacer.  Please rinse and gargle after using. Will give your prescription for albuterol  that you can use 2 puffs up to every 4 hours if needed for shortness of breath, chest tightness, wheezing. We will perform a walking oximetry today to see if you qualify for supplemental oxygen. Follow in our office in 3 months so we can see how you are doing on the inhaled medication.  Call sooner if you have any problems

## 2024-12-11 NOTE — Patient Instructions (Addendum)
 We reviewed your pulmonary function testing and your CT scan of the chest today.  These are most consistent with significant COPD. Please stop Stiolto We will try starting Breztri  2 puffs twice a day.  You will use this with a spacer.  Please rinse and gargle after using. Will give your prescription for albuterol  that you can use 2 puffs up to every 4 hours if needed for shortness of breath, chest tightness, wheezing. We will perform a walking oximetry today to see if you qualify for supplemental oxygen. Follow in our office in 3 months so we can see how you are doing on the inhaled medication.  Call sooner if you have any problems

## 2024-12-11 NOTE — Telephone Encounter (Signed)
 Pt is already scheduled with Dr. Byrum, NFN.

## 2024-12-12 MED ORDER — BREZTRI AEROSPHERE 160-9-4.8 MCG/ACT IN AERO: INHALATION_SPRAY | RESPIRATORY_TRACT | Status: AC

## 2024-12-12 NOTE — Addendum Note (Signed)
 Addended byBETHA FRIES, Keyvin Rison A on: 12/12/2024 11:58 AM   Modules accepted: Orders

## 2024-12-12 NOTE — Progress Notes (Signed)
Patient seen in the office today and instructed on use of Breztri and spacer.  Patient expressed understanding and demonstrated technique.

## 2024-12-22 ENCOUNTER — Other Ambulatory Visit: Payer: Self-pay | Admitting: Gastroenterology

## 2024-12-31 ENCOUNTER — Encounter: Payer: Self-pay | Admitting: Cardiology

## 2025-01-06 ENCOUNTER — Ambulatory Visit
Admission: RE | Admit: 2025-01-06 | Discharge: 2025-01-06 | Disposition: A | Discharge status: Home / Self Care | Source: Ambulatory Visit | Attending: Physician Assistant | Admitting: Physician Assistant

## 2025-01-06 ENCOUNTER — Encounter: Payer: Self-pay | Admitting: Professional

## 2025-01-06 ENCOUNTER — Ambulatory Visit: Admitting: Professional

## 2025-01-06 DIAGNOSIS — F411 Generalized anxiety disorder: Secondary | ICD-10-CM | POA: Diagnosis not present

## 2025-01-06 DIAGNOSIS — F331 Major depressive disorder, recurrent, moderate: Secondary | ICD-10-CM | POA: Diagnosis not present

## 2025-01-06 DIAGNOSIS — F4310 Post-traumatic stress disorder, unspecified: Secondary | ICD-10-CM | POA: Diagnosis not present

## 2025-01-06 NOTE — Progress Notes (Signed)
 "  [facility] Counselor/Therapist Progress Note  Patient ID: [name], MRN: [id number],    Date: 01/06/2025  Time Spent: 54 minutes 1005-1059am   Treatment Type: Individual Therapy  Risk Assessment: Danger to Self:  No Self-injurious Behavior: No Danger to Others: No  Subjective: This session was held via video teletherapy. The patient consented to video teletherapy and was located in her home during this session. She is aware it is the responsibility of the patient to secure confidentiality on her end of the session. The provider was in a private home office for the duration of this session.    The patient arrived on time for the [name] appointment.   Issues addressed: 1-[name] and [name] -things blew up the day after our visit -they said they do not want to see the pt anymore 2-[name]  -still has things she wants to discuss -[name] has an issue with her marrying Zell, her sister's father -she questions why she married him -[name] cannot remember anything good about Zell -pt does not recall many things that [employer] about -they have had some conversations with [name]; Education Administrator -pt thinks that she believes that she is in a much happier place -I am not a nervous wreck -she took off [name]'s ringer so that she doesn't hear the sound -she thinks [name]'s so mad because she stopped  3-continued memory issues -since beginning of January pt has forgotten how to drive her -she could not determine what door to enter into a room   2025 Treatment Plan Problems: Anxiety, Chronic Pain Symptoms: Excessive and/or unrealistic worry that is difficult to control occurring more days than not for at least 6 months about a number of events or activities. Hypervigilance (e.g., feeling constantly on edge, experiencing concentration difficulties, having trouble falling or staying asleep, exhibiting a general state of irritability). Experiences an  increase in general physical discomfort (e.g., fatigue, night sweats, insomnia, muscle tension, body aches). Experiences back or neck pain, interstitial cystitis, or diabetic neuropathy. Complains of generalized pain in many joints, muscles, and bones that debilitates normal functioning. Goals: Enhance ability to effectively cope with the full variety of life's worries and anxieties. Acquire and utilize the necessary pain management skills. Accept the chronic pain and move on with life as much as possible. Find an escape route from the pain. Objectives target date for all objectives is 10/22/2025: Engage in positive self-talk as an alternative to the depressing, negative thoughts about self and the world. Learn and implement specific coping skills as well as when and how to use them to manage pain and its consequences. Learn and implement calming skills such as relaxation, biofeedback, or mindfulness meditation to ease pain. Learn mental coping skills and implement with somatic skills for managing acute pain. Interventions: Assign the client to read about progressive muscle relaxation and other calming strategies in relevant books or treatment manuals (e.g., The Relaxation and Stress Reduction Workbook by Nicholaus Aw, and [place]; Living Beyond Your Pain by Doreene and [place]). Identify areas in the client's life where he/she can implement skills learned through relaxation or biofeedback. Teach the client distraction techniques (e.g., pleasant imagery, counting techniques, alternative focal point) and how to use them with relaxation skills for the management of acute episodes of pain (or assign Controlling the Focus on Physical Problems in the Adult Psychotherapy Homework Planner by Jenniffer). Teach the client specific coping skills based on an assessment of need (e.g., problem-solving, social/communication, conflict resolution, goal-setting). Assist the client in reframing  thoughts about  his/her life as one that has many positive elements outside of the pain; ask him/her to list positive aspects of himself/herself as well as his life circumstances (or assign Positive Self-Talk and/or What's Good about Me and My Life? in the Adult Psychotherapy Homework Planner by Jenniffer).  Diagnosis:Major depressive disorder, recurrent episode, moderate (HCC)  Generalized anxiety disorder  PTSD (post-traumatic stress disorder)  Plan:  -consider writing what you would want to say to [name], but do not mail, use as an opportunity to purge your thoughts and feelings -meet on Tuesday, January 20, 2025 at 10am virtual.   "

## 2025-01-12 ENCOUNTER — Encounter: Payer: Self-pay | Admitting: Psychology

## 2025-01-13 ENCOUNTER — Ambulatory Visit: Payer: Self-pay | Admitting: Psychology

## 2025-01-13 ENCOUNTER — Encounter: Payer: Self-pay | Admitting: Psychology

## 2025-01-13 DIAGNOSIS — F09 Unspecified mental disorder due to known physiological condition: Secondary | ICD-10-CM | POA: Diagnosis not present

## 2025-01-13 DIAGNOSIS — F331 Major depressive disorder, recurrent, moderate: Secondary | ICD-10-CM | POA: Diagnosis not present

## 2025-01-13 DIAGNOSIS — F411 Generalized anxiety disorder: Secondary | ICD-10-CM | POA: Diagnosis not present

## 2025-01-13 DIAGNOSIS — R4189 Other symptoms and signs involving cognitive functions and awareness: Secondary | ICD-10-CM

## 2025-01-13 NOTE — Progress Notes (Signed)
" ° °  Psychometrician Note   Cognitive testing was administered to [name] by Lonell Jude, B.S. (psychometrist) under the supervision of Dr. Arthea KYM [name], Ph.D., ABPP, licensed psychologist on 01/13/2025. [name] did not appear overtly distressed by the testing session per behavioral observation or responses across self-report questionnaires. Rest breaks were offered.   The battery of tests administered was selected by Dr. Zachary C. Merz, Ph.D., ABPP with consideration to [name] current level of functioning, the nature of her symptoms, emotional and behavioral responses during interview, level of literacy, observed level of motivation/effort, and the nature of the referral question. This battery was communicated to the psychometrist. Communication between Dr. Arthea KYM [name], Ph.D., ABPP and the psychometrist was ongoing throughout the evaluation and Dr. Arthea KYM [name], Ph.D., ABPP was immediately accessible at all times. Dr. Zachary C. Merz, Ph.D., ABPP provided supervision to the psychometrist on the date of this service to the extent necessary to assure the quality of all services provided.    [name] will return within approximately 1-2 weeks for an interactive feedback session with [name] at which time her test performances, clinical impressions, and treatment recommendations will be reviewed in detail. [name] understands she can contact our office should she require our assistance before this time.  A total of 145 minutes of billable time were spent face-to-face with [name] by the psychometrist. This includes both test administration and scoring time. Billing for these services is reflected in the clinical report generated by Dr. Arthea KYM [name], Ph.D., ABPP  This note reflects time spent with the psychometrician and does not include test scores or any clinical interpretations made by [name]. The full report will follow in a separate note. "

## 2025-01-13 NOTE — Progress Notes (Signed)
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 headaches/migraines: Endorsed. Frequent instances of dizziness/vertigo: Endorsed.   Sensory changes: Denied.  Balance/coordination difficulties: Endorsed. As stated above, she has experienced several falls over the years. She noted commonly tripping over her feet and attributed some balance instability to the presence of peripheral neuropathy. She has been using a cane to assist with ambulation. She previously acknowledged that her physical therapist and other clinicians have advised her to use a walker to ambulate. She appears to have generally stuck with her cane for added stability.  Other motor difficulties: Endorsed. She reported primary action/intention tremors (e.g., trying to thread a  needle) and stated that a previous clinician described this experience as essential tremor.  Sleep History: Estimated hours obtained each night: Unclear.  Difficulties falling asleep: Variably so. Difficulties staying asleep: Variably so. Feels rested and refreshed upon awakening: Unclear. She reported commonly experiencing significant fatigue and exhaustion throughout the day.    History of snoring: Endorsed. History of waking up gasping for air: Endorsed. Witnessed breath cessation while asleep: Endorsed. Medical records suggest a history of obstructive sleep apnea and COPD overlay.    History of vivid dreaming: Endorsed. Excessive movement while asleep: Endorsed. Instances of acting out her dreams: Endorsed. She described the presence of distressing, vivid nightmares where she may hit,  kick, fight, and otherwise act out dream content. Her husband noted that vocalizations (i.e., yelling for help) seemed more prevalent. Symptoms have been present since early 2024 and have persisted to a mild degree.   Psychiatric/Behavioral Health History: Depression: There is a lengthy history of depression, at least dating back to the unexpected passing of her first husband. In the time since her previous evaluation, she commented that she had now accepted underlying depressive symptomatology. She readily described active depressive symptoms and described significant family-based distress stemming from early relationships with her first husband (who was an alcoholic) and children. She has re-engaged with an individual therapist, describing a positive therapeutic alliance and some mood improvement recently. Current or remote suicidal ideation, intent, or plan was denied.  Anxiety: There is a longstanding history of generalized anxious distress. This was said to be quite apparent lately, largely surrounding current relationship statuses with her children, as well as concern for cognitive deterioration.  Mania:  Denied. Trauma History: Denied. Visual/auditory hallucinations: Denied. Delusional thoughts: Denied.   Tobacco: Denied. Alcohol: She denied current alcohol consumption as well as a history of problematic alcohol abuse or dependence.  Recreational drugs: Denied.  Family History: Problem Relation Age of Onset   Rectal cancer Mother    Colon cancer Mother    Pancreatic cancer Mother    Diabetes Mother    Breast cancer Mother 40   Breast cancer Maternal Aunt        breast   Birth defects Maternal Aunt    Irritable bowel syndrome Son    Heart disease Son        CAD, MV replacement   Allergic Disorder Daughter    Diabetes Daughter    Arthritis Daughter    Colon cancer Father    Colon polyps Father    Diabetes Father    Stroke Father    Heart disease Father        CHF   Hyperlipidemia Father    Hypertension Father    Arthritis Father    Breast cancer Paternal Aunt    Breast cancer Paternal Aunt    Arthritis Paternal Uncle    Allergic Disorder Daughter    Heart disease Cousin        CAD, had a blood clot after stenting   Arthritis Sister    Esophageal cancer Neg Hx    Stomach cancer Neg Hx    This information was confirmed by Ms. Shams.  Academic/Vocational History: Highest level of educational attainment: 16 years. She reportedly graduated high school of the valedictorian of her class (this could not be independently verified) and earned a Bachelor's degree in nursing. She described herself as a designer, multimedia in academic settings.  History of developmental delay: Denied. History of grade repetition: Denied. Enrollment in special education courses: Denied. History of LD/ADHD: Denied.   Employment: Retired. She previously worked as an [employer].   Evaluation Results:   Behavioral Observations: Ms. Schreckengost was accompanied by her husband, arrived to her appointment on time, and was appropriately dressed and groomed. She appeared alert. She ambulated with the assistance of a  cane but observed gait and station were within normal limits. Gross motor functioning appeared intact upon informal observation and no abnormal movements (e.g., tremors) were noted. Her affect ranged given the subject being discussed during interview but was never inappropriate given the discussion. Spontaneous speech was fluent and word finding difficulties were not observed during the clinical interview. Thought processes were coherent, organized, and normal in content. Insight  into her cognitive difficulties appeared adequate.   During testing, sustained attention was appropriate. Task engagement was adequate and she persisted when challenged. Overall, Ms. Ackman was cooperative with the clinical interview and subsequent testing procedures.   Adequacy of Effort: The validity of neuropsychological testing is limited by the extent to which the individual being tested may be assumed to have exerted adequate effort during testing. Ms. Gladd expressed her intention to perform to the best of her abilities and exhibited adequate task engagement and persistence. Scores across stand-alone and embedded performance validity measures were within expectation. As such, the results of the current evaluation are believed to be a valid representation of Ms. Baucum's current cognitive functioning.  Test Results: Ms. Helbling was fully oriented at the time of the current evaluation.  Intellectual abilities based upon educational and vocational attainment were estimated to be in the average range. Premorbid abilities were estimated to be within the average range based upon a single-word reading test.   Processing speed was variable, ranging from the well below average to average normative ranges. Basic attention was average. More complex attention (e.g., working memory) was below average to average. Executive functioning was variable, ranging from the well below average to average normative ranges. She performed in the  well above average normative range across a task assessing safety and judgment.  Assessed receptive language abilities were above average. Likewise, Ms. Plourde did not exhibit any difficulties comprehending task instructions and answered all questions asked of her appropriately. Assessed expressive language was variable. Phonemic fluency was well below average to below average, semantic fluency was exceptionally low to below average, and confrontation naming was average to above average.     Assessed visuospatial/visuoconstructional abilities were below average to average.    Learning (i.e., encoding) of novel information was below average to average. Spontaneous delayed recall (i.e., retrieval) of previously learned information was average. Retention rates were appropriate across all memory tasks. Performance across recognition tasks was also appropriate, suggesting evidence for information consolidation.   Results of emotional screening instruments suggested that recent symptoms of generalized anxiety were in the moderate range, while symptoms of depression were also within the moderate range. A screening instrument assessing recent sleep quality suggested the presence of moderate sleep dysfunction.  Table of Scores:   Note: This summary of test scores accompanies the interpretive report and should not be considered in isolation without reference to the appropriate sections in the text. Descriptors are based on appropriate normative data and may be adjusted based on clinical judgment. Terms such as Within Normal Limits and Outside Normal Limits are used when a more specific description of the test score cannot be determined. Descriptors refer to the current evaluation only.        Percentile - Normative Descriptor > 98 - Exceptionally High 91-97 - Well Above Average 75-90 - Above Average 25-74 - Average 9-24 - Below Average 2-8 - Well Below Average < 2 - Exceptionally Low         Validity: August 2024 Current  DESCRIPTOR        DCT: --- --- --- Within Normal Limits  RBANS EI: --- --- --- Within Normal Limits  WAIS-IV RDS: --- --- --- Within Normal Limits        Orientation:       Raw Score Raw Score Percentile   NAB Orientation, Form 1 29/29 29/29 --- ---        Cognitive Screening:       Raw Score Raw Score  Percentile   SLUMS: 23/30 26/30 --- ---        RBANS, Form A: Standard Score/ Scaled Score Standard Score/ Scaled Score Percentile   Total Score 92 85 16 Below Average  Immediate Memory 100 87 19 Below Average    List Learning 9 7 16  Below Average    Story Memory 11 9 37 Average  Visuospatial/Constructional 89 89 23 Below Average    Figure Copy 7 10 50 Average    Line Orientation 17/20 13/20 10-16 Below Average  Language 85 85 16 Below Average    Picture Naming 10/10 10/10 51-75 Average    Semantic Fluency 4 4 2  Well Below Average  Attention 97 85 16 Below Average    Digit Span 12 8 25  Average    Coding 7 7 16  Below Average  Delayed Memory 103 101 53 Average    List Recall 6/10 6/10 51-75 Average    List Recognition 19/20 20/20 51-75 Average    Story Recall 15 10 50 Average    Story Recognition 12/12 11/12 47-68 Average    Figure Recall 7 8 25  Average    Figure Recognition 4/8 7/8 53-69 Average         Intellectual Functioning:       Standard Score Standard Score Percentile   Test of Premorbid Functioning: 101 95 37 Average        Attention/Executive Function:      Trail Making Test (TMT): Raw Score (T Score) Raw Score (T Score) Percentile     Part A 44 secs.,  0 errors (45) 53 secs.,  0 errors (36) 8 Well Below Average    Part B 195 secs.,  3 errors (34) 230 secs.,  4 errors (30) 2 Well Below Average          Scaled Score Scaled Score Percentile   WAIS-IV Digit Span: 10 8 25  Average    Forward 12 9 37 Average    Backward 9 6 9  Below Average    Sequencing 9 9 37 Average         Scaled Score Scaled Score Percentile   WAIS-IV  Similarities: 10 10 50 Average        D-KEFS Color-Word Interference Test: Raw Score (Scaled Score) Raw Score (Scaled Score) Percentile     Color Naming 41 secs. (7) 42 secs. (7) 16 Below Average    Word Reading 27 secs. (9) 28 secs. (9) 37 Average    Inhibition 97 secs. (6) 103 secs. (5) 5 Well Below Average      Total Errors 0 errors (13) 6 errors (7) 16 Below Average    Inhibition/Switching 97 secs. (8) 99 secs. (7) 16 Below Average      Total Errors 2 errors (11) 4 errors (9) 37 Average        D-KEFS Verbal Fluency Test: Raw Score (Scaled Score) Raw Score (Scaled Score) Percentile     Letter Total Correct 18 (5) 25 (7) 16 Below Average    Category Total Correct 24 (6) 25 (7) 16 Below Average    Category Switching Total Correct 9 (6) 8 (5) 5 Well Below Average    Category Switching Accuracy 8 (7) 7 (6) 9 Below Average      Total Set Loss Errors 0 (13) 0 (13) 84 Above Average      Total Repetition Errors 0 (13) 1 (12) 75 Above Average        NAB Executive Functions Module, Form 1: T Score  T Score Percentile     Judgment 66 66 95 Well Above Average        Language:      Verbal Fluency Test: Raw Score (T Score) Raw Score (T Score) Percentile     Phonemic Fluency (FAS) 18 (29) 25 (33) 5 Well Below Average    Animal Fluency 11 (30) 10 (25) 1 Exceptionally Low         NAB Language Module, Form 1: T Score T Score Percentile     Auditory Comprehension 57 57 75 Above Average    Naming 30/31 (59) 30/31 (59) 82 Above Average        Visuospatial/Visuoconstruction:       Raw Score Raw Score Percentile   Clock Drawing: 4/10 10/10 --- Within Normal Limits         Scaled Score Scaled Score Percentile   WAIS-IV Block Design: 9 10 50 Average        Mood and Personality:       Raw Score Raw Score Percentile   Beck Depression Inventory - II: 24 21 --- Moderate  PROMIS Anxiety Questionnaire: 25 26 --- Moderate        Additional Questionnaires:       Raw Score Raw Score Percentile    PROMIS Sleep Disturbance Questionnaire: 29 30 --- Moderate   Informed Consent and Coding/Compliance:   The current evaluation represents a clinical evaluation for the purposes previously outlined by the referral source and is in no way reflective of a forensic evaluation.   Ms. Trefz was provided with a verbal description of the nature and purpose of the present neuropsychological evaluation. Also reviewed were the foreseeable risks and/or discomforts and benefits of the procedure, limits of confidentiality, and mandatory reporting requirements of this provider. The patient was given the opportunity to ask questions and receive answers about the evaluation. Oral consent to participate was provided by the patient.   This evaluation was conducted by Arthea KYM Maryland, Ph.D., ABPP-CN, board certified clinical neuropsychologist. Ms. Darwish completed a clinical interview with Dr. Maryland, billed as one unit [phone], and 145 minutes of cognitive testing and scoring, billed as one unit [phone] and four additional units [id number]. Psychometrist Lonell Jude, B.S. assisted Dr. Maryland with test administration and scoring procedures. As a separate and discrete service, one unit [id number] and two units [id number] (177 minutes) were billed for Dr. Loralee time spent in interpretation and report writing.      "

## 2025-01-14 ENCOUNTER — Other Ambulatory Visit: Payer: Self-pay | Admitting: Family Medicine

## 2025-01-14 DIAGNOSIS — M797 Fibromyalgia: Secondary | ICD-10-CM

## 2025-01-20 ENCOUNTER — Ambulatory Visit: Admitting: Professional

## 2025-01-20 ENCOUNTER — Encounter: Payer: Self-pay | Admitting: Professional

## 2025-01-20 DIAGNOSIS — F4310 Post-traumatic stress disorder, unspecified: Secondary | ICD-10-CM

## 2025-01-20 DIAGNOSIS — F331 Major depressive disorder, recurrent, moderate: Secondary | ICD-10-CM

## 2025-01-20 DIAGNOSIS — F411 Generalized anxiety disorder: Secondary | ICD-10-CM

## 2025-01-20 NOTE — Progress Notes (Signed)
 "  [facility] Counselor/Therapist Progress Note  Patient ID: [name], MRN: [id number],    Date: 01/20/2025  Time Spent: 61 minutes 1001-1102am   Treatment Type: Individual Therapy  Risk Assessment: Danger to Self:  No Self-injurious Behavior: No Danger to Others: No  Subjective: This session was held via video teletherapy. The patient consented to video teletherapy and was located in her home during this session. She is aware it is the responsibility of the patient to secure confidentiality on her end of the session. The provider was in a private home office for the duration of this session.    The patient arrived on time for the [name] appointment.   Issues addressed: 1-homework -consider writing what you would want to say to [name], but do not mail, use as an opportunity to purge your thoughts and feelings 2-physical -had surgery 3-memory doctor -had an MRI and neurological testing -they think she is more impaired than 2024 when last evaluated 4-family a-her stepdaughter [name] had a baby boy and she was excited to share b-[name] -has had no contact -pt continues to reach out in small ways c-she talked with [name] every other day and they are doing fine d-talked to son [name] -he reminded her that his stepfather history is in the past 5-PCP [name] is going to [employer] [facility] and is leaving the bedside -pt thinks she is ready for a change of practice and would 6-depression acceptance -pt admitted it took her a long time to accept her depression -she admits that therapy has been a helpful tool to help her learn and accept -pt admits she believed it was a stigma and that if she had depression she was weak 7-parenting hx -pt has greatly increased her awareness of her responsibility in issues with her children -pt able to admit that she was more focused on herself and work -she wants to talk to [name] but she knows the distancing is the best thing  for [name] because she has to learn  2025 Treatment Plan Problems: Anxiety, Chronic Pain Symptoms: Excessive and/or unrealistic worry that is difficult to control occurring more days than not for at least 6 months about a number of events or activities. Hypervigilance (e.g., feeling constantly on edge, experiencing concentration difficulties, having trouble falling or staying asleep, exhibiting a general state of irritability). Experiences an increase in general physical discomfort (e.g., fatigue, night sweats, insomnia, muscle tension, body aches). Experiences back or neck pain, interstitial cystitis, or diabetic neuropathy. Complains of generalized pain in many joints, muscles, and bones that debilitates normal functioning. Goals: Enhance ability to effectively cope with the full variety of life's worries and anxieties. Acquire and utilize the necessary pain management skills. Accept the chronic pain and move on with life as much as possible. Find an escape route from the pain. Objectives target date for all objectives is 10/22/2025: Engage in positive self-talk as an alternative to the depressing, negative thoughts about self and the world. Learn and implement specific coping skills as well as when and how to use them to manage pain and its consequences. Learn and implement calming skills such as relaxation, biofeedback, or mindfulness meditation to ease pain. Learn mental coping skills and implement with somatic skills for managing acute pain. Interventions: Assign the client to read about progressive muscle relaxation and other calming strategies in relevant books or treatment manuals (e.g., The Relaxation and Stress Reduction Workbook by Nicholaus Aw, and [place]; Living Beyond Your Pain by Doreene and [place]). Identify areas  in the client's life where he/she can implement skills learned through relaxation or biofeedback. Teach the client distraction techniques (e.g., pleasant  imagery, counting techniques, alternative focal point) and how to use them with relaxation skills for the management of acute episodes of pain (or assign Controlling the Focus on Physical Problems in the Adult Psychotherapy Homework Planner by Jenniffer). Teach the client specific coping skills based on an assessment of need (e.g., problem-solving, social/communication, conflict resolution, goal-setting). Assist the client in reframing thoughts about his/her life as one that has many positive elements outside of the pain; ask him/her to list positive aspects of himself/herself as well as his life circumstances (or assign Positive Self-Talk and/or What's Good about Me and My Life? in the Adult Psychotherapy Homework Planner by Jenniffer).  Diagnosis:Major depressive disorder, recurrent episode, moderate (HCC)  Generalized anxiety disorder  PTSD (post-traumatic stress disorder)  Plan:  -meet on Wednesday, January 28, 2025 at [employer]   "

## 2025-01-21 ENCOUNTER — Ambulatory Visit: Admitting: Student

## 2025-01-26 ENCOUNTER — Ambulatory Visit: Admitting: Family Medicine

## 2025-01-27 ENCOUNTER — Ambulatory Visit: Payer: Self-pay | Admitting: Psychology

## 2025-01-27 DIAGNOSIS — F331 Major depressive disorder, recurrent, moderate: Secondary | ICD-10-CM

## 2025-01-27 DIAGNOSIS — F09 Unspecified mental disorder due to known physiological condition: Secondary | ICD-10-CM | POA: Diagnosis not present

## 2025-01-27 DIAGNOSIS — F411 Generalized anxiety disorder: Secondary | ICD-10-CM

## 2025-01-30 ENCOUNTER — Ambulatory Visit: Admitting: Student

## 2025-01-30 ENCOUNTER — Encounter: Payer: Self-pay | Admitting: Student

## 2025-01-30 VITALS — BP 116/76 | HR 72 | Resp 16 | Ht 65.0 in | Wt 129.6 lb

## 2025-01-30 DIAGNOSIS — K219 Gastro-esophageal reflux disease without esophagitis: Secondary | ICD-10-CM

## 2025-01-30 DIAGNOSIS — E559 Vitamin D deficiency, unspecified: Secondary | ICD-10-CM

## 2025-01-30 DIAGNOSIS — E538 Deficiency of other specified B group vitamins: Secondary | ICD-10-CM

## 2025-01-30 DIAGNOSIS — F5101 Primary insomnia: Secondary | ICD-10-CM

## 2025-01-30 DIAGNOSIS — L309 Dermatitis, unspecified: Secondary | ICD-10-CM

## 2025-01-30 DIAGNOSIS — F411 Generalized anxiety disorder: Secondary | ICD-10-CM

## 2025-01-30 DIAGNOSIS — I10 Essential (primary) hypertension: Secondary | ICD-10-CM

## 2025-01-30 MED ORDER — FLUOCINOLONE ACETONIDE 0.01 % OT OIL: 3.0000 [drp] | TOPICAL_OIL | OTIC | 5 refills | Status: AC

## 2025-01-30 NOTE — Assessment & Plan Note (Signed)
 Well controlled, no changes to meds. Encouraged heart healthy diet such as the DASH diet and exercise as tolerated.

## 2025-01-30 NOTE — Assessment & Plan Note (Signed)
 Stable on current medications. Trazodone  50 mg HS, continue same.

## 2025-01-30 NOTE — Progress Notes (Addendum)
 "  Subjective:     Patient ID: [name], female    DOB: Jan 02, 1946, 80 y.o.   MRN: [id number]  Chief Complaint  Patient presents with   Transitions Of Care    Patient is here for transfer of care to a new provider.    HPI  Discussed the use of AI scribe software for clinical note transcription with the patient, who gave verbal consent to proceed.  History of Present Illness [name] is a 79 year old female presents for [facility]  She has long-standing severe depression with major impact on daily function and decision-making. She sleeps about fourteen hours daily and feels hopeless but denies suicidal ideation. She is dissatisfied with current medications. Denies SI/HI. Recenty seen by neurology, no concerns for dementia at this time per Neuro note.  She uses hydroxyzine  as needed about once every couple of weeks but finds it sedating.    She has chronic pain with fibromyalgia. She has stage three chronic kidney disease and past diagnoses of Sjogren's syndrome and polymyalgia rheumatica, without current prednisone  or other specific treatment. She previously worked as an [employer] for thirty-two years.  Past medical history- R breast cancer, estrogen receptor positive; OSA, COPD  Follows with-Ortho, PT, [facility], neph[facility], gastroenterology, cardiology, behavioral health, Neurology, ENT, Rheumatology, Behavioral Health- Nathanel Collet  COPD and Asthma Albuterol  as needed, fluticasone    HTN-amlodipine  5 mg daily, atenolol  25 mg twice daily  CAD-aspirin  81 mg daily  Anxiety and depression- Behavioral health- Nathanel Collet, Counselor Cymbalta  60 mg twice daily, hydroxyzine  10 mg (0.5-2 tab) p.o. 3 times daily as needed for anxiety  HLD-Repatha  injection q. 14 days  BLE edema-furosemide  20 mg as needed  Constipation-follows with GI-Dr. Elspeth Naval Linzess  145 mcg daily, MiraLAX  17 g twice daily  GERD-Protonix  40 mg twice daily  Chronic  pain/neuropathy-gabapentin  TID  Patient denies fever, chills, SOB, CP, palpitations, dyspnea, edema, HA, vision changes, N/V/D, abdominal pain, urinary symptoms, rash, weight changes, and recent illness or hospitalizations.   History of Present Illness              Health Maintenance Due  Topic Date Due   HEMOGLOBIN A1C  10/21/2024   Diabetic kidney evaluation - Urine ACR  01/23/2025   Medicare Annual Wellness (AWV)  03/04/2025   Mammogram  03/06/2025    Past Medical History:  Diagnosis Date   Abdominal pain 10/26/2013   Allergic rhinitis 01/25/2016   Allergy     ANA positive 07/29/2013   Anemia    Arthralgia 08/18/2013   Arthritis    Asthma, allergic 08/05/2012   Atrial flutter    past history- not current   Atypical chest pain 01/01/2015   Bilateral carpal tunnel syndrome 03/04/2015   Bilateral elbow joint pain 09/22/2024   CAD in native artery 08/25/2010   Minor CAD 2009, 2011, low risk Myoview  2013 and 2016        Cataract    bilaterally removed    Cellulitis of breast 02/05/2023   Chronic constipation    Chronic cough 01/09/2017   Chronic interstitial cystitis 02/01/2017   Chronic night sweats 01/08/2015   Common bile duct dilatation 02/13/2023   Concussion    x 3   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   DDD (degenerative disc disease), cervical 09/17/2018   Degenerative scoliosis 04/22/2018   Deviated septum 05/12/2016   Dysphagia    Dyspnea    Dysuria 03/09/2021   Elevated alkaline phosphatase level 01/25/2023   Essential hypertension  08/25/2010   External hemorrhoids 02/05/2015   Facial trauma 01/22/2023   Family history of adverse reaction to anesthesia    Family history of malignant hyperthermia    father had this   Fibrocystic breast disease 10/18/2010   Fibromyalgia    Frequency of urination    Generalized anxiety disorder 08/03/2010   GERD (gastroesophageal reflux disease)    Headache 10/13/2013   High serum vitamin D  05/18/2021   History  of chronic bronchitis    History of colonic polyp 02/25/2019   History of hiatal hernia    History of tachycardia    CONTROLLED  WITH ATENOLOL    Hyperglycemia 03/08/2021   Hyperkalemia 09/28/2022   Hyperlipidemia    Intertrigo 12/13/2022   Kidney stone 02/26/2019   Knee pain, right 04/30/2015   Left hip pain 04/30/2015   Leg pain 02/24/2011   Qualifier: Diagnosis of   By: Antonio DOJamee         Leukocytosis 07/05/2022   Local reaction to hymenoptera sting 05/09/2023   Low back pain 09/08/2020   Major depressive disorder 02/05/2013   Malignant neoplasm of lower-outer quadrant of right breast of female, estrogen receptor positive 01/03/2013   Nasal turbinate hypertrophy 05/12/2016   Oral herpes 02/05/2023   OSA and COPD overlap syndrome 06/10/2020   Osteoporosis 01/2019   T score -2.2 stable/improved from prior study   Pelvic pain    Peripheral neuropathy 05/22/2014   Personal history of radiation therapy    Pneumonia    Primary insomnia 06/25/2017   Pulmonary nodules 02/12/2015   Rash and other nonspecific skin eruption 05/09/2023   Recurrent falls 05/02/2020   Recurrent sinusitis 02/25/2019   Right arm pain 07/20/2016   RLS (restless legs syndrome) 11/09/2019   RUQ pain 07/05/2022   S/P radiation therapy 11/12/12 - 12/05/12   right Breast   Sciatica 04/30/2015   Sepsis 2014   from UTI    Sjogren's disease 12/12/2021   Splenic lesion 09/02/2012   Sprain of lateral ligament of ankle joint 07/31/2023   Stage 3 chronic kidney disease    Statin intolerance 02/29/2020   Stricture and stenosis of esophagus 10/19/2011   Tongue lesion 02/05/2023   Tremor 07/05/2022   Urgency of urination    Vertigo 01/22/2023   Vitamin B12 deficiency 03/08/2021   Vocal cord cyst 05/18/2021    Past Surgical History:  Procedure Laterality Date   24 HOUR PH STUDY N/A 04/03/2016   Procedure: 24 HOUR PH STUDY;  Surgeon: Elspeth Deward Naval, MD;  Location: [facility];  Service:  Gastroenterology;  Laterality: N/A;   ANKLE ARTHROSCOPY WITH RECONSTRUCTION Left 03/12/2024   Procedure: ANKLE ARTHROSCOPY WITH OPEN LATERAL ANKLE LIGAMENT STABILIZATION;  Surgeon: Barton Drape, MD;  Location: [facility];  Service: Orthopedics;  Laterality: Left;   BREAST BIOPSY Right 08/23/2012   ADH   BREAST BIOPSY Right 10/11/2012   Ductal Carcinoma   BREAST EXCISIONAL BIOPSY Right 09/04/2013   benign   BREAST LUMPECTOMY Right 10/11/2012   W/ SLN BX   CARDIAC CATHETERIZATION  09-13-2007  DR MORRIS   WELL-PRESERVED LVF/  DIFFUSE SCATTERED CORONARY CALCIFACATION AND ATHEROSCLEROSIS WITHOUT OBSTRUCTION   CARDIAC CATHETERIZATION  08-04-2010  DR MCALHANY   NON-OBSTRUCTIVE CAD/  pLAD 40%/  oLAD 30%/  mLAD 30%/  pRCA 30%/  EF 60%   CARDIOVASCULAR STRESS TEST  06-18-2012  DR McALHANY   LOW RISK NUCLEAR STUDY/  SMALL FIXED AREA OF MODERATELY DECREASED UPTAKE IN ANTEROSEPTAL WALL WHICH MAY BE ARTIFACTUAL/  NO ISCHEMIA/  EF 68%   COLONOSCOPY  09/29/2010   CYSTOSCOPY     CYSTOSCOPY WITH HYDRODISTENSION AND BIOPSY N/A 03/06/2014   Procedure: CYSTOSCOPY/HYDRODISTENSION/ INSTILATION OF MARCAINE  AND PYRIDIUM ;  Surgeon: Arlena LILLETTE Gal, MD;  Location: [facility];  Service: Urology;  Laterality: N/A;   CYSTOSCOPY/URETEROSCOPY/HOLMIUM LASER/STENT PLACEMENT Left 03/21/2023   Procedure: CYSTOSCOPY LEFT RETROGRADE PYELOGRAM, LEFT URETEROSCOPY, HOLMIUM LASER LITHOTRIPSYM, AND LEFT URETERAL STENT PLACEMENT;  Surgeon: Devere Lonni Righter, MD;  Location: [facility];  Service: Urology;  Laterality: Left;  60 MINUTES   ESOPHAGEAL MANOMETRY N/A 04/03/2016   Procedure: ESOPHAGEAL MANOMETRY (EM);  Surgeon: Elspeth Deward Naval, MD;  Location: [facility];  Service: Gastroenterology;  Laterality: N/A;   EXTRACORPOREAL SHOCK WAVE LITHOTRIPSY Left 02/06/2019   Procedure: EXTRACORPOREAL SHOCK WAVE LITHOTRIPSY (ESWL);  Surgeon: Ottelin, Mark, MD;  Location: [facility];  Service:  Urology;  Laterality: Left;   NASAL SINUS SURGERY  1985   ORIF RIGHT ANKLE  FX  2006   POLYPECTOMY     REMOVAL VOCAL CORD CYST  01/2013   REPAIR OF PERONEUS BREVIS TENDON Left 03/12/2024   Procedure: REPAIR OF PERONEUS TENDON;  Surgeon: Barton Drape, MD;  Location: [facility];  Service: Orthopedics;  Laterality: Left;   RIGHT BREAST BX  08/23/2012   RIGHT HAND SURGERY  X3  LAST ONE 2009   INCLUDES  ORIF RIGHT 5TH FINGER AND REVISION TWICE   SKIN CANCER EXCISION     face,arms   TONSILLECTOMY AND ADENOIDECTOMY  AGE 31   TOTAL ABDOMINAL HYSTERECTOMY W/ BILATERAL SALPINGOOPHORECTOMY  1982   W/  APPENDECTOMY   TRANSTHORACIC ECHOCARDIOGRAM  06/24/2012   GRADE I DIASTOLIC DYSFUNCTION/  EF 55-60%/  MILD MR   UPPER GASTROINTESTINAL ENDOSCOPY      Family History  Problem Relation Age of Onset   Rectal cancer Mother    Colon cancer Mother    Pancreatic cancer Mother    Diabetes Mother    Breast cancer Mother 33   Breast cancer Maternal Aunt        breast   Birth defects Maternal Aunt    Irritable bowel syndrome Son    Heart disease Son        CAD, MV replacement   Allergic Disorder Daughter    Diabetes Daughter    Arthritis Daughter    Colon cancer Father    Colon polyps Father    Diabetes Father    Stroke Father    Heart disease Father        CHF   Hyperlipidemia Father    Hypertension Father    Arthritis Father    Breast cancer Paternal Aunt    Breast cancer Paternal Aunt    Arthritis Paternal Uncle    Allergic Disorder Daughter    Heart disease Cousin        CAD, had a blood clot after stenting   Arthritis Sister    Esophageal cancer Neg Hx    Stomach cancer Neg Hx     Social History   Socioeconomic History   Marital status: Married    Spouse name: Tanda    Number of children: 3   Years of education: 16   Highest education level: Bachelor's degree (e.g., BA, AB, BS)  Occupational History   Occupation: Retired    Comment: [employer]  Tobacco Use    Smoking status: Former    Current packs/day: 0.00    Average packs/day: 1 pack/day for 15.0 years (15.0 ttl pk-yrs)  Types: Cigarettes    Start date: 05/27/1990    Quit date: 05/27/2005    Years since quitting: 19.6    Passive exposure: Never   Smokeless tobacco: Never  Vaping Use   Vaping status: Never Used  Substance and Sexual Activity   Alcohol use: No    Alcohol/week: 0.0 standard drinks of alcohol   Drug use: No   Sexual activity: Not Currently    Partners: Male    Birth control/protection: Surgical    Comment: 1st intercourse 79 yo-Fewer than 5 partners, hysterectomy  Other Topics Concern   Not on file  Social History Narrative   Lives with husband.   Caffeine use: 1/2 cup per day   Exercise-- 2days a week [employer],  Water  aerobics, walking       Retired engineer, civil (consulting)   No dietary restrictions, tries to maintain a heart healthy diet   Very pleasant lady   Social Drivers of Health   Tobacco Use: Medium Risk (01/30/2025)   Patient History    Smoking Tobacco Use: Former    Smokeless Tobacco Use: Never    Passive Exposure: Never  Physicist, Medical Strain: Low Risk (10/19/2024)   Overall Financial Resource Strain (CARDIA)    Difficulty of Paying Living Expenses: Not hard at all  Food Insecurity: No Food Insecurity (10/19/2024)   Epic    Worried About Programme Researcher, Broadcasting/film/video in the Last Year: Never true    Ran Out of Food in the Last Year: Never true  Transportation Needs: No Transportation Needs (10/19/2024)   Epic    Lack of Transportation (Medical): No    Lack of Transportation (Non-Medical): No  Physical Activity: Insufficiently Active (10/19/2024)   Exercise Vital Sign    Days of Exercise per Week: 2 days    Minutes of Exercise per Session: 20 min  Stress: No Stress Concern Present (10/19/2024)   [employer] of Occupational Health - Occupational Stress Questionnaire    Feeling of Stress: Only a little  Social Connections: Socially Integrated (10/19/2024)   Social  Connection and Isolation Panel    Frequency of Communication with Friends and Family: More than three times a week    Frequency of Social Gatherings with Friends and Family: Twice a week    Attends Religious Services: More than 4 times per year    Active Member of [employer] or Organizations: Yes    Attends Banker Meetings: More than 4 times per year    Marital Status: Married  Catering Manager Violence: Not At Risk (03/04/2024)   Humiliation, Afraid, Rape, and Kick questionnaire    Fear of Current or Ex-Partner: No    Emotionally Abused: No    Physically Abused: No    Sexually Abused: No  Depression (PHQ2-9): Medium Risk (01/30/2025)   Depression (PHQ2-9)    PHQ-2 Score: 9  Alcohol Screen: Low Risk (03/04/2024)   Alcohol Screen    Last Alcohol Screening Score (AUDIT): 0  Housing: Low Risk (10/19/2024)   Epic    Unable to Pay for Housing in the Last Year: No    Number of Times Moved in the Last Year: 0    Homeless in the Last Year: No  Utilities: Not At Risk (03/04/2024)   AHC Utilities    Threatened with loss of utilities: No  Health Literacy: Adequate Health Literacy (03/04/2024)   B1300 Health Literacy    Frequency of need for help with medical instructions: Never    Outpatient Medications Prior to Visit  Medication  Sig Dispense Refill   acetaminophen  (TYLENOL ) 500 MG tablet Take 1,000 mg by mouth every 6 (six) hours as needed for mild pain or headache.      albuterol  (VENTOLIN  HFA) 108 (90 Base) MCG/ACT inhaler Inhale 2 puffs into the lungs every 4 (four) hours as needed for wheezing or shortness of breath (coughing fits). 18 g 1   amLODipine  (NORVASC ) 5 MG tablet Take 1 tablet by mouth once daily 90 tablet 3   aspirin  EC 81 MG tablet Take 1 tablet (81 mg total) by mouth daily. 90 tablet 3   atenolol  (TENORMIN ) 25 MG tablet Take 1 tablet (25 mg total) by mouth 2 (two) times daily. 180 tablet 1   budesonide-glycopyrrolate-formoterol (BREZTRI  AEROSPHERE) 160-9-4.8 MCG/ACT  AERO inhaler Inhale 2 puffs into the lungs in the morning and at bedtime. 10.7 g 3   budesonide-glycopyrrolate-formoterol (BREZTRI  AEROSPHERE) 160-9-4.8 MCG/ACT AERO inhaler 1 sample     carboxymethylcellul-glycerin (OPTIVE) 0.5-0.9 % ophthalmic solution Place 1 drop into both eyes daily as needed for dry eyes.     cyanocobalamin  (VITAMIN B12) 1000 MCG tablet Take 1,000 mg by mouth daily with breakfast.     diclofenac Sodium (VOLTAREN) 1 % GEL Apply 2 g topically daily as needed (Back pain).     DULoxetine  (CYMBALTA ) 60 MG capsule Take 1 capsule (60 mg total) by mouth 2 (two) times daily. 180 capsule 1   EPINEPHrine  0.3 mg/0.3 mL IJ SOAJ injection Inject 0.3 mg into the muscle as needed for anaphylaxis. 2 each 1   estradiol  (ESTRACE ) 0.01 % CREA vaginal cream Place 1 Applicatorful vaginally 3 (three) times a week.     fluticasone  (FLONASE ) 50 MCG/ACT nasal spray Place 2 sprays into both nostrils daily. 16 g 5   folic acid  (FOLVITE ) 1 MG tablet Take 1 tablet (1 mg total) by mouth daily.     furosemide  (LASIX ) 20 MG tablet TAKE 1 TABLET BY MOUTH AS NEEDED FOR EDEMA 90 tablet 3   gabapentin  (NEURONTIN ) 300 MG capsule Take 2 capsules (600 mg total) by mouth in the morning AND 1 capsule (300 mg total) every evening AND 3 capsules (900 mg total) at bedtime. 180 capsule 1   hydrOXYzine  (ATARAX ) 10 MG tablet Take 0.5-2 tablets (5-20 mg total) by mouth 3 (three) times daily as needed for anxiety (not with Loratadine ). 90 tablet 2   linaclotide  (LINZESS ) 145 MCG CAPS capsule TAKE 1 CAPSULE BY MOUTH ONCE DAILY BEFORE BREAKFAST 30 capsule 2   nitroGLYCERIN  (NITROSTAT ) 0.4 MG SL tablet Place 1 tablet (0.4 mg total) under the tongue every 5 (five) minutes x 3 doses as needed for chest pain. 25 tablet 2   nystatin  cream (MYCOSTATIN ) Apply 1 application topically 2 (two) times daily. 30 g 2   pantoprazole  (PROTONIX ) 40 MG tablet Take 1 tablet by mouth 2 (two) times daily.     polyethylene glycol (MIRALAX ) 17 g  packet Take 17 g by mouth 2 (two) times daily. 14 each 0   REPATHA  SURECLICK 140 MG/ML SOAJ INJECT CONTENTS OF 1 PEN SUBCUTANEOUSLY EVERY 14 DAYS 6 mL 0   Spacer/Aero-Holding Chambers DEVI 1 Device by Does not apply route as needed. 1 each 0   sucralfate  (CARAFATE ) 1 GM/10ML suspension Take 10 mLs (1 g total) by mouth 4 (four) times daily -  with meals and at bedtime. 420 mL 1   traMADol  (ULTRAM ) 50 MG tablet Take 1 tablet by mouth 3 (three) times daily as needed.     traZODone  (DESYREL ) 100  MG tablet Take 1 tablet (100 mg total) by mouth at bedtime. 90 tablet 1   RSV vaccine recomb adjuvanted (AREXVY ) 120 MCG/0.5ML injection Inject into the muscle. 1 each 0   Tiotropium Bromide-Olodaterol (STIOLTO RESPIMAT ) 2.5-2.5 MCG/ACT AERS Inhale 2 puffs into the lungs daily. 1 each 5   Tiotropium Bromide-Olodaterol (STIOLTO RESPIMAT ) 2.5-2.5 MCG/ACT AERS 1 sample     Vitamin D , Ergocalciferol , (DRISDOL ) 1.25 MG (50000 UNIT) CAPS capsule Take 1 capsule by mouth once a week 4 capsule 0   No facility-administered medications prior to visit.    Allergies  Allergen Reactions   Clindamycin Hcl Shortness Of Breath and Rash   Cortisone Hives, Other (See Comments) and Swelling   Levofloxacin  Rash    Other reaction(s): sick, rash hives severe itching   Other Other (See Comments)    Father had malignant hyperthermia   Penicillins Anaphylaxis   Rosuvastatin  Anaphylaxis   Baclofen Rash   Codeine Hives and Nausea Only   Erythromycin Hives   Haemophilus Influenzae Vaccines Other (See Comments)    Local reaction at site   Latex Hives   Lincomycin Other (See Comments)   Pentazocine Lactate Other (See Comments)    HALLUCINATION   Pneumococcal Vaccine Polyvalent Hives, Swelling and Other (See Comments)    REACTION: swelling at injection site   Prednisone  Rash    Other reaction(s): Rash, Hives - can tolerate if she takes with benadryl     Tamoxifen  Nausea And Vomiting   Influenza Vaccine Recombinant      Other Reaction(s): Not available   Ketorolac      Other Reaction(s): Not available   Selenium    Seroquel  [Quetiapine ] Dermatitis   Doxycycline  Rash   Fluzone [Influenza Virus Vaccine] Hives    Local reaction at the site   Haemophilus Influenzae Other (See Comments)    Local reaction at the site     ROS See HPI    Objective:    Physical Exam Vitals reviewed.  Constitutional:      General: She is not in acute distress.    Appearance: She is not toxic-appearing.  HENT:     Head: Normocephalic and atraumatic.     Mouth/Throat:     Mouth: Mucous membranes are moist.     Pharynx: Oropharynx is clear.  Eyes:     Pupils: Pupils are equal, round, and reactive to light.  Cardiovascular:     Rate and Rhythm: Normal rate and regular rhythm.     Pulses: Normal pulses.     Heart sounds: Normal heart sounds. No murmur heard. Pulmonary:     Effort: Pulmonary effort is normal. No respiratory distress.     Breath sounds: Normal breath sounds. No wheezing.  Musculoskeletal:        General: No swelling.     Cervical back: Neck supple.     Comments: Ambulates with cane  Skin:    General: Skin is warm and dry.  Neurological:     General: No focal deficit present.     Mental Status: She is alert and oriented to person, place, and time.  Psychiatric:        Mood and Affect: Mood is anxious. Affect is tearful.        Behavior: Behavior is cooperative.        Thought Content: Thought content normal.        Judgment: Judgment normal.      BP 116/76 (BP Location: Right Arm, Patient Position: Sitting, Cuff Size: Normal)   Pulse  72   Resp 16   Ht 5' 5 (1.651 m)   Wt 129 lb 9.6 oz (58.8 kg)   SpO2 98%   BMI 21.57 kg/m  Wt Readings from Last 3 Encounters:  01/30/25 129 lb 9.6 oz (58.8 kg)  12/11/24 128 lb 12.8 oz (58.4 kg)  12/01/24 129 lb 6.4 oz (58.7 kg)       Assessment & Plan:   Problem List Items Addressed This Visit       Cardiovascular and Mediastinum   Essential  hypertension - Primary   Well controlled, no changes to meds. Encouraged heart healthy diet such as the DASH diet and exercise as tolerated.          Digestive   GERD (gastroesophageal reflux disease) (Chronic)     Other   Generalized anxiety disorder   Relevant Orders   Ambulatory referral to Psychiatry   Primary insomnia   Stable on current medications. Trazodone  50 mg HS, continue same.      Vitamin B12 deficiency   Supplement monitor.       Vitamin D  deficiency   Supplement and monitor        Treatment-resistant major depressive disorder Chronic, treatment-resistant depression affecting daily functioning. Current medications may be suboptimal. - Referred to psychiatry for comprehensive evaluation and management, Pt agreeable to this. - Continue current medications until psychiatric evaluation.  Generalized anxiety disorder - Referred to psychiatry for evaluation and management.  Primary insomnia Excessive sleep possibly related to depression and medication side effects. Current management may be ineffective. - Referred to psychiatry for evaluation of sleep disturbances in the context of mental health treatment.  Fatigue Chronic fatigue impacting daily activities.  - Advise on maintaining a nutritious diet and adequate protein intake. - Encourage continued physical activity and social engagement.  Otic dermatitis Pt request refill for Rx Fluocinolone  Acetonide 0.01 % OIL refilled today. Pt to follow up with dermatology for future refills.   FU 4 months    I have discontinued Sheri RAMAN. Sparr's Stiolto Respimat , Stiolto Respimat , Arexvy , and Vitamin D  (Ergocalciferol ). I am also having her start on Fluocinolone  Acetonide. Additionally, I am having her maintain her acetaminophen , aspirin  EC, cyanocobalamin , nystatin  cream, sucralfate , diclofenac Sodium, OPTIVE, folic acid , EPINEPHrine , polyethylene glycol, furosemide , nitroGLYCERIN , traMADol , hydrOXYzine ,  amLODipine , fluticasone , atenolol , pantoprazole , Repatha  SureClick, gabapentin , traZODone , estradiol , Spacer/Aero-Holding Chambers, albuterol , Breztri  Aerosphere, Breztri  Aerosphere, Linzess , and DULoxetine .  Meds ordered this encounter  Medications   Fluocinolone  Acetonide 0.01 % OIL    Sig: Place 3 drops in ear(s) once a week.    Dispense:  20 mL    Refill:  5    Supervising Provider:   DOMENICA BLACKBIRD A [[id number]]   "

## 2025-01-30 NOTE — Assessment & Plan Note (Signed)
 Supplement monitor.

## 2025-01-30 NOTE — Assessment & Plan Note (Signed)
 Hydrate and monitor, follows with nephrology-[facility].

## 2025-01-30 NOTE — Assessment & Plan Note (Signed)
 Supplement and monitor

## 2025-02-17 ENCOUNTER — Ambulatory Visit: Admitting: Professional

## 2025-03-03 ENCOUNTER — Ambulatory Visit: Admitting: Professional

## 2025-03-10 ENCOUNTER — Ambulatory Visit

## 2025-03-11 ENCOUNTER — Ambulatory Visit: Admitting: Emergency Medicine

## 2025-03-17 ENCOUNTER — Ambulatory Visit: Admitting: Professional

## 2025-03-18 ENCOUNTER — Ambulatory Visit (HOSPITAL_BASED_OUTPATIENT_CLINIC_OR_DEPARTMENT_OTHER): Admit: 2025-03-18 | Admitting: Orthopaedic Surgery

## 2025-03-18 ENCOUNTER — Encounter (HOSPITAL_BASED_OUTPATIENT_CLINIC_OR_DEPARTMENT_OTHER): Payer: Self-pay

## 2025-04-30 ENCOUNTER — Ambulatory Visit: Admitting: Family Medicine

## 2025-05-04 ENCOUNTER — Ambulatory Visit: Admitting: Family Medicine

## 2025-05-06 ENCOUNTER — Ambulatory Visit: Payer: Self-pay | Admitting: Physician Assistant

## 2025-06-02 ENCOUNTER — Ambulatory Visit: Admitting: Student
